# Patient Record
Sex: Male | Born: 1950 | Race: Black or African American | Hispanic: No | Marital: Married | Smoking: Former smoker
Health system: Southern US, Community
[De-identification: ages and names within clinical notes are randomized; demographics above are authoritative.]

## PROBLEM LIST (undated history)

## (undated) DIAGNOSIS — E1142 Type 2 diabetes mellitus with diabetic polyneuropathy: Secondary | ICD-10-CM

## (undated) DIAGNOSIS — E119 Type 2 diabetes mellitus without complications: Secondary | ICD-10-CM

## (undated) DIAGNOSIS — E7800 Pure hypercholesterolemia, unspecified: Secondary | ICD-10-CM

## (undated) DIAGNOSIS — N183 Chronic kidney disease, stage 3 unspecified: Secondary | ICD-10-CM

## (undated) DIAGNOSIS — G4733 Obstructive sleep apnea (adult) (pediatric): Secondary | ICD-10-CM

## (undated) DIAGNOSIS — I10 Essential (primary) hypertension: Secondary | ICD-10-CM

## (undated) DIAGNOSIS — D649 Anemia, unspecified: Secondary | ICD-10-CM

## (undated) DIAGNOSIS — A159 Respiratory tuberculosis unspecified: Secondary | ICD-10-CM

## (undated) DIAGNOSIS — Z9989 Dependence on other enabling machines and devices: Secondary | ICD-10-CM

## (undated) DIAGNOSIS — I739 Peripheral vascular disease, unspecified: Secondary | ICD-10-CM

## (undated) DIAGNOSIS — M1990 Unspecified osteoarthritis, unspecified site: Secondary | ICD-10-CM

## (undated) HISTORY — PX: TONSILLECTOMY: SUR1361

## (undated) HISTORY — PX: DILATION AND CURETTAGE OF UTERUS: SHX78

---
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
## 1997-12-25 ENCOUNTER — Ambulatory Visit: Admission: RE | Admit: 1997-12-25 | Discharge: 1997-12-25 | Payer: Self-pay | Admitting: Otolaryngology

## 1999-08-18 ENCOUNTER — Ambulatory Visit: Admission: RE | Admit: 1999-08-18 | Discharge: 1999-08-18 | Payer: Self-pay | Admitting: Otolaryngology

## 2003-06-26 DIAGNOSIS — E119 Type 2 diabetes mellitus without complications: Secondary | ICD-10-CM

## 2003-06-26 HISTORY — DX: Type 2 diabetes mellitus without complications: E11.9

## 2003-11-16 ENCOUNTER — Encounter: Admission: RE | Admit: 2003-11-16 | Discharge: 2003-11-16 | Payer: Self-pay | Admitting: Internal Medicine

## 2011-05-21 ENCOUNTER — Emergency Department (HOSPITAL_COMMUNITY)
Admission: EM | Admit: 2011-05-21 | Discharge: 2011-05-22 | Disposition: A | Discharge status: Home / Self Care | Payer: BC Managed Care – PPO | Attending: Emergency Medicine | Admitting: Emergency Medicine

## 2011-05-21 DIAGNOSIS — R110 Nausea: Secondary | ICD-10-CM | POA: Insufficient documentation

## 2011-05-21 DIAGNOSIS — R109 Unspecified abdominal pain: Secondary | ICD-10-CM | POA: Insufficient documentation

## 2011-05-21 DIAGNOSIS — E119 Type 2 diabetes mellitus without complications: Secondary | ICD-10-CM | POA: Insufficient documentation

## 2011-05-21 DIAGNOSIS — I10 Essential (primary) hypertension: Secondary | ICD-10-CM | POA: Insufficient documentation

## 2011-05-21 DIAGNOSIS — M549 Dorsalgia, unspecified: Secondary | ICD-10-CM | POA: Insufficient documentation

## 2011-05-21 DIAGNOSIS — Z794 Long term (current) use of insulin: Secondary | ICD-10-CM | POA: Insufficient documentation

## 2011-05-21 HISTORY — DX: Pure hypercholesterolemia, unspecified: E78.00

## 2011-05-21 HISTORY — DX: Essential (primary) hypertension: I10

## 2011-05-21 LAB — URINALYSIS, DIPSTICK ONLY
Bilirubin Urine: NEGATIVE
Glucose, UA: NEGATIVE mg/dL
Hgb urine dipstick: NEGATIVE
Ketones, ur: NEGATIVE mg/dL
Leukocytes, UA: NEGATIVE
Nitrite: NEGATIVE
Protein, ur: NEGATIVE mg/dL
Specific Gravity, Urine: 1.012 (ref 1.005–1.030)
Urobilinogen, UA: 1 mg/dL (ref 0.0–1.0)
pH: 6.5 (ref 5.0–8.0)

## 2011-05-21 NOTE — ED Notes (Signed)
Pt complains of left lower back pain, no injury noted, pain is intermittent and is worse in the am, primary Dr told him to come here for further evaluation. Pt also complains of pain radiating to his groin, pt hasn't seen any blood in his urine

## 2011-05-22 ENCOUNTER — Emergency Department (HOSPITAL_COMMUNITY): Payer: BC Managed Care – PPO

## 2011-05-22 LAB — BASIC METABOLIC PANEL
BUN: 10 mg/dL (ref 6–23)
CO2: 29 mEq/L (ref 19–32)
Calcium: 10.1 mg/dL (ref 8.4–10.5)
Chloride: 102 mEq/L (ref 96–112)
Creatinine, Ser: 0.96 mg/dL (ref 0.50–1.35)
GFR calc Af Amer: >90 mL/min (ref >90)
GFR calc non Af Amer: 88 mL/min — ABNORMAL LOW (ref >90)
Glucose, Bld: 86 mg/dL (ref 70–99)
Potassium: 3.8 mEq/L (ref 3.5–5.1)
Sodium: 138 mEq/L (ref 135–145)

## 2011-05-22 MED ORDER — HYDROMORPHONE HCL PF 1 MG/ML IJ SOLN: 1.0000 mg | Freq: Once | INTRAMUSCULAR | Status: DC

## 2011-05-22 MED ORDER — OXYCODONE-ACETAMINOPHEN 5-325 MG PO TABS: 1.0000 | ORAL_TABLET | ORAL | Status: AC | PRN

## 2011-05-22 MED ORDER — OXYCODONE-ACETAMINOPHEN 5-325 MG PO TABS
1.0000 | ORAL_TABLET | Freq: Once | ORAL | Status: AC
  Administered 2011-05-22: 1 via ORAL
  Filled 2011-05-22: qty 1

## 2011-05-22 MED ORDER — SODIUM CHLORIDE 0.9 % IV BOLUS (SEPSIS): 1000.0000 mL | Freq: Once | INTRAVENOUS | Status: DC

## 2011-05-22 NOTE — ED Provider Notes (Signed)
History     CSN: [id number] Arrival date & time: 05/21/2011  8:11 PM   First MD Initiated Contact with Patient 05/22/11 [phone]      Chief Complaint  Patient presents with  . Back Pain    (Consider location/radiation/quality/duration/timing/severity/associated sxs/prior treatment) The history is provided by the patient.   patient reports intermittent left flank pain for approximately one week.  Today he reports it worsened.  It became severe he developed nausea.  He told by his doctor to come to the ER for evaluation.  No prior history of ureteral stone.  He reports his pain is improving at this time.  He has a history of hypertension diabetes.  He's never been told he has an abdominal aneurysm.  His pain radiates around his left abdomen.  He has no urinary symptoms.  He has no fever or chills.  He has no testicular or penile pain.  Nothing worsens the symptoms.  Nothing improves the symptoms.  Currently her symptoms are mild.  That are worse his symptoms are severe in severity  Past Medical History  Diagnosis Date  . Hypertension   . Diabetes mellitus   . High cholesterol     History reviewed. No pertinent past surgical history.  History reviewed. No pertinent family history.  History  Substance Use Topics  . Smoking status: Not on file  . Smokeless tobacco: Not on file  . Alcohol Use: No      Review of Systems  Musculoskeletal: Positive for back pain.  All other systems reviewed and are negative.    Allergies  Review of patient's allergies indicates no known allergies.  Home Medications   Current Outpatient Rx  Name Route Sig Dispense Refill  . AMLODIPINE BESY-BENAZEPRIL HCL 5-40 MG PO CAPS Oral Take 1 capsule by mouth daily.      . ATORVASTATIN CALCIUM 20 MG PO TABS Oral Take 20 mg by mouth daily.      Marland Kitchen GLIMEPIRIDE 4 MG PO TABS Oral Take 4 mg by mouth daily before breakfast.      . INSULIN GLARGINE 100 UNIT/ML [place] SOLN Subcutaneous Inject 30 Units into the skin  at bedtime.     Marland Kitchen METFORMIN HCL 500 MG PO TABS Oral Take 500 mg by mouth 2 (two) times daily with a meal.      . NEBIVOLOL HCL 5 MG PO TABS Oral Take 5 mg by mouth daily.        BP 150/81  Pulse 59  Temp(Src) 98.4 F (36.9 C) (Oral)  Resp 18  SpO2 99%  Physical Exam  Nursing note and vitals reviewed. Constitutional: He is oriented to person, place, and time. He appears well-developed and well-nourished.  HENT:  Head: Normocephalic and atraumatic.  Eyes: EOM are normal.  Neck: Normal range of motion.  Cardiovascular: Normal rate, regular rhythm, normal heart sounds and intact distal pulses.   Pulmonary/Chest: Effort normal and breath sounds normal. No respiratory distress.  Abdominal: Soft. He exhibits no distension. There is no tenderness.  Genitourinary:       No CVA tenderness  Musculoskeletal: Normal range of motion.  Neurological: He is alert and oriented to person, place, and time.  Skin: Skin is warm and dry.  Psychiatric: He has a normal mood and affect. Judgment normal.    ED Course  Procedures (including critical care time)  Labs Reviewed  BASIC METABOLIC PANEL - Abnormal; Notable for the following:    GFR calc non Af Amer 88 (*)  All other components within normal limits  URINALYSIS, DIPSTICK ONLY   Ct Abdomen Pelvis Wo Contrast  05/22/2011  *RADIOLOGY REPORT*  Clinical Data: Left flank pain and history renal calculi.  CT ABDOMEN AND PELVIS WITHOUT CONTRAST  Technique:  Multidetector CT imaging of the abdomen and pelvis was performed following the standard protocol without intravenous contrast.  Comparison: 11/16/2003  Findings: No renal or ureteral calculi identified.  No hydronephrosis.  There is interval enlargement of these cysts of the lower pole of the left kidney.  This measures approximately 4 cm in greatest dimensions.  Internal density measurements are consistent with simple fluid by CT.  Other solid organs by unenhanced CT are unremarkable.  Bowel loops  are within normal limits.  No free fluid or abscess identified.  No hernias.  No abnormal calcifications.  Diverticulosis of the sigmoid colon present without evidence of diverticulitis. Atherosclerotic calcification of the abdominal aorta without aneurysmal disease.  No enlarged lymph nodes.  The spine is unremarkable.  Visualized lung bases show a well-circumscribed 7 mm subpleural nodule in the lateral right lower lobe which does not appear calcified.  IMPRESSION:  1.  No evidence of renal or ureteral calculi. 2.  Enlargement of a lower pole left renal cyst measure approximately 4 cm since prior CT in 2005.  This otherwise has a benign features by unenhanced CT. 3.  Incidental detection of 7 mm subpleural noncalcified nodule in the lateral right lower lobe.  Please see recommendations below for follow-up.  If the patient is at high risk for bronchogenic carcinoma, follow- up chest CT at 3-6 months is recommended.  If the patient is at low risk for bronchogenic carcinoma, follow-up chest CT at 6-12 months is recommended.  This recommendation follows the consensus statement: Guidelines for Management of Small Pulmonary Nodules Detected on CT Scans: A Statement from the [employer] as published in Radiology 2005; 237:395-400. Online at: https://www.arnold.com/.  Original Report Authenticated By: Azzie Roup, M.D.     1. Left flank pain       MDM  Symptoms concerning for left ureteral colic.  Labs and CT scan pending.  Pain treated  3:52 AM Patient feels much better at this time.  CT scan without pathology to explain cause of left flank pain.  Suspect recently passed left ureteral stone given the presentation.  Patient has been informed of his pulmonary nodule in his right lateral lower lung.  Is informed that he needs a repeat CT scan in 6 months.  The patient and spouse understand this and agree to followup CT scan as scheduled by [place], MD 05/22/11 [phone]

## 2012-12-01 ENCOUNTER — Emergency Department (HOSPITAL_COMMUNITY): Payer: BC Managed Care – PPO

## 2012-12-01 ENCOUNTER — Inpatient Hospital Stay (HOSPITAL_COMMUNITY)
Admission: EM | Admit: 2012-12-01 | Discharge: 2012-12-07 | DRG: 083 | Disposition: A | Discharge status: Home / Self Care | Payer: BC Managed Care – PPO | Attending: General Surgery | Admitting: General Surgery

## 2012-12-01 ENCOUNTER — Encounter (HOSPITAL_COMMUNITY): Payer: Self-pay | Admitting: Nurse Practitioner

## 2012-12-01 DIAGNOSIS — S2241XS Multiple fractures of ribs, right side, sequela: Secondary | ICD-10-CM

## 2012-12-01 DIAGNOSIS — S62639S Displaced fracture of distal phalanx of unspecified finger, sequela: Secondary | ICD-10-CM

## 2012-12-01 DIAGNOSIS — N289 Disorder of kidney and ureter, unspecified: Secondary | ICD-10-CM

## 2012-12-01 DIAGNOSIS — N189 Chronic kidney disease, unspecified: Secondary | ICD-10-CM | POA: Diagnosis present

## 2012-12-01 DIAGNOSIS — I517 Cardiomegaly: Secondary | ICD-10-CM | POA: Diagnosis present

## 2012-12-01 DIAGNOSIS — I498 Other specified cardiac arrhythmias: Secondary | ICD-10-CM | POA: Diagnosis not present

## 2012-12-01 DIAGNOSIS — N182 Chronic kidney disease, stage 2 (mild): Secondary | ICD-10-CM

## 2012-12-01 DIAGNOSIS — IMO0002 Reserved for concepts with insufficient information to code with codable children: Secondary | ICD-10-CM

## 2012-12-01 DIAGNOSIS — S2220XS Unspecified fracture of sternum, sequela: Secondary | ICD-10-CM

## 2012-12-01 DIAGNOSIS — R0902 Hypoxemia: Secondary | ICD-10-CM | POA: Diagnosis not present

## 2012-12-01 DIAGNOSIS — S2691XA Contusion of heart, unspecified with or without hemopericardium, initial encounter: Secondary | ICD-10-CM

## 2012-12-01 DIAGNOSIS — M19079 Primary osteoarthritis, unspecified ankle and foot: Secondary | ICD-10-CM | POA: Diagnosis present

## 2012-12-01 DIAGNOSIS — G4733 Obstructive sleep apnea (adult) (pediatric): Secondary | ICD-10-CM | POA: Diagnosis present

## 2012-12-01 DIAGNOSIS — E119 Type 2 diabetes mellitus without complications: Secondary | ICD-10-CM

## 2012-12-01 DIAGNOSIS — I491 Atrial premature depolarization: Secondary | ICD-10-CM | POA: Diagnosis not present

## 2012-12-01 DIAGNOSIS — E1149 Type 2 diabetes mellitus with other diabetic neurological complication: Secondary | ICD-10-CM | POA: Diagnosis present

## 2012-12-01 DIAGNOSIS — S2220XA Unspecified fracture of sternum, initial encounter for closed fracture: Secondary | ICD-10-CM

## 2012-12-01 DIAGNOSIS — I10 Essential (primary) hypertension: Secondary | ICD-10-CM

## 2012-12-01 DIAGNOSIS — I499 Cardiac arrhythmia, unspecified: Secondary | ICD-10-CM

## 2012-12-01 DIAGNOSIS — Z87891 Personal history of nicotine dependence: Secondary | ICD-10-CM

## 2012-12-01 DIAGNOSIS — I129 Hypertensive chronic kidney disease with stage 1 through stage 4 chronic kidney disease, or unspecified chronic kidney disease: Secondary | ICD-10-CM | POA: Diagnosis present

## 2012-12-01 DIAGNOSIS — Y92410 Unspecified street and highway as the place of occurrence of the external cause: Secondary | ICD-10-CM

## 2012-12-01 DIAGNOSIS — Z8611 Personal history of tuberculosis: Secondary | ICD-10-CM

## 2012-12-01 DIAGNOSIS — K5900 Constipation, unspecified: Secondary | ICD-10-CM | POA: Diagnosis not present

## 2012-12-01 DIAGNOSIS — S62639A Displaced fracture of distal phalanx of unspecified finger, initial encounter for closed fracture: Secondary | ICD-10-CM

## 2012-12-01 DIAGNOSIS — Z794 Long term (current) use of insulin: Secondary | ICD-10-CM

## 2012-12-01 DIAGNOSIS — S27892A Contusion of other specified intrathoracic organs, initial encounter: Secondary | ICD-10-CM

## 2012-12-01 DIAGNOSIS — Y998 Other external cause status: Secondary | ICD-10-CM

## 2012-12-01 DIAGNOSIS — E1142 Type 2 diabetes mellitus with diabetic polyneuropathy: Secondary | ICD-10-CM | POA: Diagnosis present

## 2012-12-01 DIAGNOSIS — Z9641 Presence of insulin pump (external) (internal): Secondary | ICD-10-CM

## 2012-12-01 DIAGNOSIS — S2241XA Multiple fractures of ribs, right side, initial encounter for closed fracture: Secondary | ICD-10-CM

## 2012-12-01 HISTORY — DX: Type 2 diabetes mellitus without complications: E11.9

## 2012-12-01 HISTORY — DX: Dependence on other enabling machines and devices: Z99.89

## 2012-12-01 HISTORY — DX: Respiratory tuberculosis unspecified: A15.9

## 2012-12-01 HISTORY — DX: Type 2 diabetes mellitus with diabetic polyneuropathy: E11.42

## 2012-12-01 HISTORY — DX: Obstructive sleep apnea (adult) (pediatric): G47.33

## 2012-12-01 HISTORY — DX: Unspecified osteoarthritis, unspecified site: M19.90

## 2012-12-01 LAB — COMPREHENSIVE METABOLIC PANEL
ALT: 19 U/L (ref 0–53)
AST: 33 U/L (ref 0–37)
Albumin: 4 g/dL (ref 3.5–5.2)
Alkaline Phosphatase: 84 U/L (ref 39–117)
BUN: 25 mg/dL — ABNORMAL HIGH (ref 6–23)
CO2: 23 mEq/L (ref 19–32)
Calcium: 10.3 mg/dL (ref 8.4–10.5)
Chloride: 103 mEq/L (ref 96–112)
Creatinine, Ser: 1.46 mg/dL — ABNORMAL HIGH (ref 0.50–1.35)
GFR calc Af Amer: 58 mL/min — ABNORMAL LOW (ref >90)
GFR calc non Af Amer: 50 mL/min — ABNORMAL LOW (ref >90)
Glucose, Bld: 102 mg/dL — ABNORMAL HIGH (ref 70–99)
Potassium: 3.8 mEq/L (ref 3.5–5.1)
Sodium: 138 mEq/L (ref 135–145)
Total Bilirubin: 0.4 mg/dL (ref 0.3–1.2)
Total Protein: 7.6 g/dL (ref 6.0–8.3)

## 2012-12-01 LAB — POCT I-STAT, CHEM 8
BUN: 28 mg/dL — ABNORMAL HIGH (ref 6–23)
Calcium, Ion: 1.23 mmol/L (ref 1.13–1.30)
Chloride: 105 mEq/L (ref 96–112)
Creatinine, Ser: 1.4 mg/dL — ABNORMAL HIGH (ref 0.50–1.35)
Glucose, Bld: 101 mg/dL — ABNORMAL HIGH (ref 70–99)
HCT: 45 % (ref 39.0–52.0)
Hemoglobin: 15.3 g/dL (ref 13.0–17.0)
Potassium: 3.8 mEq/L (ref 3.5–5.1)
Sodium: 140 mEq/L (ref 135–145)
TCO2: 32 mmol/L (ref 0–100)

## 2012-12-01 LAB — URINE MICROSCOPIC-ADD ON

## 2012-12-01 LAB — URINALYSIS, ROUTINE W REFLEX MICROSCOPIC
Bilirubin Urine: NEGATIVE
Glucose, UA: 250 mg/dL — AB
Ketones, ur: NEGATIVE mg/dL
Leukocytes, UA: NEGATIVE
Nitrite: NEGATIVE
Protein, ur: NEGATIVE mg/dL
Specific Gravity, Urine: 1.035 — ABNORMAL HIGH (ref 1.005–1.030)
Urobilinogen, UA: 0.2 mg/dL (ref 0.0–1.0)
pH: 6 (ref 5.0–8.0)

## 2012-12-01 LAB — CG4 I-STAT (LACTIC ACID): Lactic Acid, Venous: 1.46 mmol/L (ref 0.5–2.2)

## 2012-12-01 LAB — CBC
HCT: 42.8 % (ref 39.0–52.0)
Hemoglobin: 14.7 g/dL (ref 13.0–17.0)
MCH: 26.3 pg (ref 26.0–34.0)
MCHC: 34.3 g/dL (ref 30.0–36.0)
MCV: 76.7 fL — ABNORMAL LOW (ref 78.0–100.0)
Platelets: 168 10*3/uL (ref 150–400)
RBC: 5.58 MIL/uL (ref 4.22–5.81)
RDW: 14.8 % (ref 11.5–15.5)
WBC: 7.5 10*3/uL (ref 4.0–10.5)

## 2012-12-01 LAB — SAMPLE TO BLOOD BANK

## 2012-12-01 LAB — PROTIME-INR
INR: 0.93 (ref 0.00–1.49)
Prothrombin Time: 12.4 seconds (ref 11.6–15.2)

## 2012-12-01 LAB — GLUCOSE, CAPILLARY: Glucose-Capillary: 84 mg/dL (ref 70–99)

## 2012-12-01 MED ORDER — SODIUM CHLORIDE 0.9 % IV BOLUS (SEPSIS)
1000.0000 mL | Freq: Once | INTRAVENOUS | Status: AC
  Administered 2012-12-01: 1000 mL via INTRAVENOUS

## 2012-12-01 MED ORDER — HYDROMORPHONE HCL PF 1 MG/ML IJ SOLN
1.0000 mg | Freq: Once | INTRAMUSCULAR | Status: AC
  Administered 2012-12-01: 1 mg via INTRAVENOUS
  Filled 2012-12-01: qty 1

## 2012-12-01 MED ORDER — DEXTROSE 250 MG/ML IV SOLN
12.5000 g | Freq: Once | INTRAVENOUS | Status: DC
  Filled 2012-12-01: qty 50

## 2012-12-01 MED ORDER — DEXTROSE 50 % IV SOLN
INTRAVENOUS | Status: AC
  Administered 2012-12-01: 25 mL
  Filled 2012-12-01: qty 50

## 2012-12-01 MED ORDER — IOHEXOL 300 MG/ML  SOLN
100.0000 mL | Freq: Once | INTRAMUSCULAR | Status: AC | PRN
  Administered 2012-12-01: 100 mL via INTRAVENOUS

## 2012-12-01 NOTE — ED Notes (Signed)
Dr. Grandville Silos paged to Dr. Redmond Pulling @ [id number] Charlton Amor D

## 2012-12-01 NOTE — Progress Notes (Signed)
Orthopedic Tech Progress Note Patient Details:  [name] [birth year] [id number]  Ortho Devices Type of Ortho Device: Finger splint Ortho Device/Splint Location: RUE Ortho Device/Splint Interventions: Ordered;Application   Braulio Bosch 12/01/2012, 10:40 PM

## 2012-12-01 NOTE — ED Notes (Addendum)
[employer] speaking with pt & family.  2 liters NS IVF infusing, VSS, no changes, to CT.

## 2012-12-01 NOTE — ED Notes (Signed)
Dr. [name].[place] at [facility].

## 2012-12-01 NOTE — ED Notes (Addendum)
CBG 53 

## 2012-12-01 NOTE — ED Notes (Addendum)
Dr. Grandville Silos (hand MD) at [facility]. orthotech paged for finger splint.

## 2012-12-01 NOTE — ED Notes (Signed)
VSS, no changes, eyeglasses cleaned and returned to pt's face, speaking with family at [facility], using urinal.

## 2012-12-01 NOTE — ED Notes (Signed)
Attempted report 
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
## 2012-12-01 NOTE — ED Notes (Signed)
Back from CT/ xray, no changes, "feel better", famil;y at [facility], alert, NAD, calm, interactive, resps e/u, speaking in clear complete sentences, VSS.

## 2012-12-01 NOTE — ED Notes (Signed)
Pt moved to room 36, family at [facility], no changes, alert, NAD, calm, skin W&D, VSS, pending CT and non-portable xrays, labs resulting.
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
## 2012-12-01 NOTE — ED Notes (Signed)
CBG 100 

## 2012-12-01 NOTE — ED Notes (Signed)
Xray at [facility], remains alert, NAD, calm, interactive, resps e/u, speaking in clear complete sentences, lying flat, skin W&D, VSS.
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
## 2012-12-02 ENCOUNTER — Encounter (HOSPITAL_COMMUNITY): Payer: Self-pay | Admitting: General Practice

## 2012-12-02 DIAGNOSIS — I517 Cardiomegaly: Secondary | ICD-10-CM

## 2012-12-02 DIAGNOSIS — I499 Cardiac arrhythmia, unspecified: Secondary | ICD-10-CM

## 2012-12-02 DIAGNOSIS — S62639A Displaced fracture of distal phalanx of unspecified finger, initial encounter for closed fracture: Secondary | ICD-10-CM

## 2012-12-02 DIAGNOSIS — I10 Essential (primary) hypertension: Secondary | ICD-10-CM | POA: Insufficient documentation

## 2012-12-02 DIAGNOSIS — IMO0001 Reserved for inherently not codable concepts without codable children: Secondary | ICD-10-CM | POA: Insufficient documentation

## 2012-12-02 DIAGNOSIS — S2220XA Unspecified fracture of sternum, initial encounter for closed fracture: Secondary | ICD-10-CM

## 2012-12-02 DIAGNOSIS — S2691XA Contusion of heart, unspecified with or without hemopericardium, initial encounter: Secondary | ICD-10-CM

## 2012-12-02 DIAGNOSIS — N189 Chronic kidney disease, unspecified: Secondary | ICD-10-CM | POA: Insufficient documentation

## 2012-12-02 LAB — GLUCOSE, CAPILLARY
Glucose-Capillary: 53 mg/dL — ABNORMAL LOW (ref 70–99)
Glucose-Capillary: 100 mg/dL — ABNORMAL HIGH (ref 70–99)
Glucose-Capillary: 168 mg/dL — ABNORMAL HIGH (ref 70–99)
Glucose-Capillary: 210 mg/dL — ABNORMAL HIGH (ref 70–99)
Glucose-Capillary: 174 mg/dL — ABNORMAL HIGH (ref 70–99)
Glucose-Capillary: 190 mg/dL — ABNORMAL HIGH (ref 70–99)

## 2012-12-02 LAB — CBC
HCT: 39.3 % (ref 39.0–52.0)
Hemoglobin: 13.6 g/dL (ref 13.0–17.0)
MCH: 26.6 pg (ref 26.0–34.0)
MCHC: 34.6 g/dL (ref 30.0–36.0)
MCV: 76.9 fL — ABNORMAL LOW (ref 78.0–100.0)
Platelets: 144 10*3/uL — ABNORMAL LOW (ref 150–400)
RBC: 5.11 MIL/uL (ref 4.22–5.81)
RDW: 14.8 % (ref 11.5–15.5)
WBC: 7.2 10*3/uL (ref 4.0–10.5)

## 2012-12-02 LAB — BASIC METABOLIC PANEL
BUN: 20 mg/dL (ref 6–23)
CO2: 23 mEq/L (ref 19–32)
Calcium: 9.1 mg/dL (ref 8.4–10.5)
Chloride: 102 mEq/L (ref 96–112)
Creatinine, Ser: 1.36 mg/dL — ABNORMAL HIGH (ref 0.50–1.35)
GFR calc Af Amer: 63 mL/min — ABNORMAL LOW (ref >90)
GFR calc non Af Amer: 55 mL/min — ABNORMAL LOW (ref >90)
Glucose, Bld: 162 mg/dL — ABNORMAL HIGH (ref 70–99)
Potassium: 3.9 mEq/L (ref 3.5–5.1)
Sodium: 136 mEq/L (ref 135–145)

## 2012-12-02 LAB — HEMOGLOBIN A1C
Hgb A1c MFr Bld: 9.6 % — ABNORMAL HIGH (ref <5.7)
Mean Plasma Glucose: 229 mg/dL — ABNORMAL HIGH (ref <117)

## 2012-12-02 LAB — TROPONIN I: Troponin I: <0.3 ng/mL (ref <0.30)

## 2012-12-02 LAB — CK: Total CK: 2746 U/L — ABNORMAL HIGH (ref 7–232)

## 2012-12-02 MED ORDER — INSULIN PUMP
Freq: Three times a day (TID) | SUBCUTANEOUS | Status: DC
  Administered 2012-12-02 – 2012-12-04 (×11): via SUBCUTANEOUS
  Administered 2012-12-04: 4 via SUBCUTANEOUS
  Administered 2012-12-04: 17:00:00 via SUBCUTANEOUS
  Administered 2012-12-05 (×2): 4 via SUBCUTANEOUS
  Administered 2012-12-05: 6 via SUBCUTANEOUS
  Administered 2012-12-05: 4 via SUBCUTANEOUS
  Administered 2012-12-06: 22:00:00 via SUBCUTANEOUS
  Administered 2012-12-06: 2 via SUBCUTANEOUS
  Administered 2012-12-06: 08:00:00 via SUBCUTANEOUS
  Administered 2012-12-07: 4 via SUBCUTANEOUS
  Filled 2012-12-02: qty 1

## 2012-12-02 MED ORDER — HYDROCODONE-ACETAMINOPHEN 5-325 MG PO TABS: 1.0000 | ORAL_TABLET | ORAL | Status: DC | PRN

## 2012-12-02 MED ORDER — PANTOPRAZOLE SODIUM 40 MG PO TBEC: 40.0000 mg | DELAYED_RELEASE_TABLET | Freq: Every day | ORAL | Status: DC

## 2012-12-02 MED ORDER — ENOXAPARIN SODIUM 40 MG/0.4ML ~~LOC~~ SOLN
40.0000 mg | SUBCUTANEOUS | Status: DC
  Filled 2012-12-02: qty 0.4

## 2012-12-02 MED ORDER — LISINOPRIL 10 MG PO TABS
10.0000 mg | ORAL_TABLET | Freq: Every day | ORAL | Status: DC
  Administered 2012-12-02 – 2012-12-07 (×5): 10 mg via ORAL
  Filled 2012-12-02 (×6): qty 1

## 2012-12-02 MED ORDER — CARVEDILOL 12.5 MG PO TABS
12.5000 mg | ORAL_TABLET | Freq: Two times a day (BID) | ORAL | Status: DC
  Administered 2012-12-02: 12.5 mg via ORAL
  Filled 2012-12-02 (×3): qty 1

## 2012-12-02 MED ORDER — ENOXAPARIN SODIUM 30 MG/0.3ML ~~LOC~~ SOLN
30.0000 mg | Freq: Two times a day (BID) | SUBCUTANEOUS | Status: DC
  Administered 2012-12-02 – 2012-12-07 (×11): 30 mg via SUBCUTANEOUS
  Filled 2012-12-02 (×16): qty 0.3

## 2012-12-02 MED ORDER — HYDROMORPHONE HCL PF 1 MG/ML IJ SOLN: 0.5000 mg | INTRAMUSCULAR | Status: DC | PRN

## 2012-12-02 MED ORDER — DOCUSATE SODIUM 100 MG PO CAPS
100.0000 mg | ORAL_CAPSULE | Freq: Two times a day (BID) | ORAL | Status: DC
  Administered 2012-12-02 – 2012-12-03 (×4): 100 mg via ORAL
  Filled 2012-12-02 (×4): qty 1

## 2012-12-02 MED ORDER — POTASSIUM CHLORIDE IN NACL 20-0.9 MEQ/L-% IV SOLN
INTRAVENOUS | Status: DC
  Administered 2012-12-02: 01:00:00 via INTRAVENOUS
  Filled 2012-12-02 (×2): qty 1000

## 2012-12-02 MED ORDER — HYDROCODONE-ACETAMINOPHEN 10-325 MG PO TABS
0.5000 | ORAL_TABLET | ORAL | Status: DC | PRN
  Administered 2012-12-02 – 2012-12-03 (×6): 2 via ORAL
  Filled 2012-12-02 (×6): qty 2

## 2012-12-02 MED ORDER — ONDANSETRON HCL 4 MG/2ML IJ SOLN: 4.0000 mg | Freq: Four times a day (QID) | INTRAMUSCULAR | Status: DC | PRN

## 2012-12-02 MED ORDER — INSULIN ASPART 100 UNIT/ML ~~LOC~~ SOLN: 0.0000 [IU] | Freq: Three times a day (TID) | SUBCUTANEOUS | Status: DC

## 2012-12-02 MED ORDER — TRIAMTERENE-HCTZ 37.5-25 MG PO TABS
1.0000 | ORAL_TABLET | Freq: Every day | ORAL | Status: DC
  Administered 2012-12-02 – 2012-12-07 (×6): 1 via ORAL
  Filled 2012-12-02 (×6): qty 1

## 2012-12-02 MED ORDER — PANTOPRAZOLE SODIUM 40 MG IV SOLR
40.0000 mg | Freq: Every day | INTRAVENOUS | Status: DC
  Filled 2012-12-02: qty 40

## 2012-12-02 MED ORDER — ONDANSETRON HCL 4 MG PO TABS: 4.0000 mg | ORAL_TABLET | Freq: Four times a day (QID) | ORAL | Status: DC | PRN

## 2012-12-02 MED ORDER — HYDROCODONE-ACETAMINOPHEN 5-325 MG PO TABS
2.0000 | ORAL_TABLET | ORAL | Status: DC | PRN
  Administered 2012-12-02: 2 via ORAL
  Filled 2012-12-02: qty 2

## 2012-12-02 MED ORDER — POLYETHYLENE GLYCOL 3350 17 G PO PACK
17.0000 g | PACK | Freq: Every day | ORAL | Status: DC
  Administered 2012-12-02 – 2012-12-07 (×6): 17 g via ORAL
  Filled 2012-12-02 (×6): qty 1

## 2012-12-02 MED ORDER — CYCLOBENZAPRINE HCL 10 MG PO TABS
10.0000 mg | ORAL_TABLET | Freq: Three times a day (TID) | ORAL | Status: DC
  Administered 2012-12-02 – 2012-12-04 (×10): 10 mg via ORAL
  Filled 2012-12-02 (×14): qty 1

## 2012-12-02 MED ORDER — MORPHINE SULFATE 2 MG/ML IJ SOLN
1.0000 mg | INTRAMUSCULAR | Status: DC | PRN
  Administered 2012-12-02: 2 mg via INTRAVENOUS
  Filled 2012-12-02: qty 1

## 2012-12-02 MED ORDER — HYDROCODONE-ACETAMINOPHEN 5-325 MG PO TABS: 0.5000 | ORAL_TABLET | ORAL | Status: DC | PRN

## 2012-12-02 NOTE — Progress Notes (Signed)
Patient is doing fine, but pain is an issue.  May have some blunt cardiac injury because of the arrhythmias this AM.  Will have cardiology assess for any treatment.  Kathryne Eriksson. Dahlia Bailiff, MD, [place] [phone] Trauma Surgeon
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
## 2012-12-02 NOTE — Progress Notes (Signed)
Inpatient Diabetes Program Recommendations  AACE/ADA: New Consensus Statement on Inpatient Glycemic Control (2013)  Target Ranges:  Prepandial:   less than 140 mg/dL      Peak postprandial:   less than 180 mg/dL (1-2 hours)      Critically ill patients:  140 - 180 mg/dL   Reason for Visit: Patient has V-GO 20 (disposable insulin delivery device that acts like a continuous insulin pump).  The V-Go 20 provides .83 units per hour of insulin and then with each push of the bolus delivery button, the patient receives 2 units of insulin.  He states that he usually gives 4 units of insulin with each meal.  He just changed V-Go out around lunch time.  Discussed insulin pump policy with patient and will have him sign consent.  Will discuss with RN.

## 2012-12-02 NOTE — Progress Notes (Signed)
UR completed 
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
## 2012-12-02 NOTE — ED Provider Notes (Signed)
I saw and evaluated the patient, reviewed the resident's note and I agree with the findings and plan.  Please see my separate note regarding my evaluation of the patient.  I personally saw and interpreted the ECG and agree with the interpretation of Dr. Sharion Settler, MD 12/02/12 1345

## 2012-12-02 NOTE — Progress Notes (Signed)
  Echocardiogram 2D Echocardiogram has been performed.  [name] 12/02/2012, 3:53 PM

## 2012-12-03 ENCOUNTER — Inpatient Hospital Stay (HOSPITAL_COMMUNITY): Payer: BC Managed Care – PPO

## 2012-12-03 DIAGNOSIS — S2241XA Multiple fractures of ribs, right side, initial encounter for closed fracture: Secondary | ICD-10-CM

## 2012-12-03 LAB — GLUCOSE, CAPILLARY
Glucose-Capillary: 208 mg/dL — ABNORMAL HIGH (ref 70–99)
Glucose-Capillary: 229 mg/dL — ABNORMAL HIGH (ref 70–99)
Glucose-Capillary: 255 mg/dL — ABNORMAL HIGH (ref 70–99)
Glucose-Capillary: 170 mg/dL — ABNORMAL HIGH (ref 70–99)
Glucose-Capillary: 131 mg/dL — ABNORMAL HIGH (ref 70–99)

## 2012-12-03 MED ORDER — OXYCODONE HCL 5 MG PO TABS
10.0000 mg | ORAL_TABLET | ORAL | Status: DC | PRN
  Administered 2012-12-03 – 2012-12-04 (×2): 20 mg via ORAL
  Administered 2012-12-04: 15 mg via ORAL
  Administered 2012-12-05 – 2012-12-06 (×6): 20 mg via ORAL
  Administered 2012-12-07 (×2): 15 mg via ORAL
  Filled 2012-12-03: qty 4
  Filled 2012-12-03: qty 3
  Filled 2012-12-03: qty 4
  Filled 2012-12-03: qty 3
  Filled 2012-12-03 (×3): qty 4
  Filled 2012-12-03: qty 3
  Filled 2012-12-03 (×3): qty 4

## 2012-12-03 MED ORDER — TRAMADOL HCL 50 MG PO TABS
100.0000 mg | ORAL_TABLET | Freq: Four times a day (QID) | ORAL | Status: DC
  Administered 2012-12-03 – 2012-12-07 (×15): 100 mg via ORAL
  Filled 2012-12-03 (×22): qty 2
  Filled 2012-12-03 (×2): qty 1
  Filled 2012-12-03 (×3): qty 2
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
## 2012-12-03 NOTE — Progress Notes (Signed)
Physical Therapy Treatment Patient Details Name: [name] MRN: [id number] DOB: 1950/08/07 Today's Date: 12/03/2012 Time: 1253-1310 PT Time Calculation (min): 17 min  PT Assessment / Plan / Recommendation Comments on Treatment Session  Pt doing well with sit to stand transfers with bracing with pillow. Improved tolerance to ambulation, walked 450' without assistive device. Ascended/descended one flight stairs with supervision. UNABLE TO PERFORM SUPINE TO/FROM SIT due to severe 9/10 sternal pain, despite being premedicated. Pt will need to be able to get in/out of bed in order to go home.     Follow Up Recommendations  No PT follow up     Does the patient have the potential to tolerate intense rehabilitation     Barriers to Discharge        Equipment Recommendations  None recommended by PT    Recommendations for Other Services    Frequency Min 3X/week   Plan Discharge plan remains appropriate;Frequency remains appropriate    Precautions / Restrictions Precautions Precautions: Sternal Precaution Comments: sternal fx Restrictions Weight Bearing Restrictions: No   Pertinent Vitals/Pain **9/10 sternal pain with attempted sit to supine 1-3/10 with walking Pt premedicated*    Mobility  Bed Mobility Details for Bed Mobility Assistance: attempted sit to supine, but pt reported severe 9/10 sternal pain with initiation of movement while bracing with pillow Transfers Transfers: Sit to Stand;Stand to Sit Sit to Stand: From bed;5: Supervision Stand to Sit: To bed;5: Supervision Details for Transfer Assistance: Sternal bracing with pillow for sit to/from stand. Verbal cues for set up (knees and trunk flexed, "nose over toes") Ambulation/Gait Ambulation/Gait Assistance: 6: Modified independent (Device/Increase time) Ambulation Distance (Feet): 450 Feet Assistive device: None Ambulation/Gait Assistance Details: Pt had sharp, stabbing pain in chest at sternum and just to left of  sternum while walking, this limited overall gait distance. Multiple standing rest breaks due to pain. Gait Pattern: Wide base of support;Within Functional Limits Stairs: Yes Stairs Assistance: 5: Supervision Stair Management Technique: Forwards;One rail Left;Step to pattern Number of Stairs: 12    Exercises     PT Diagnosis:    PT Problem List:   PT Treatment Interventions:     PT Goals Acute Rehab PT Goals PT Goal Formulation: With patient Time For Goal Achievement: 12/05/12 Potential to Achieve Goals: Good Pt will go Supine/Side to Sit: with min assist PT Goal: Supine/Side to Sit - Progress: Not met Pt will go Sit to Supine/Side: with min assist PT Goal: Sit to Supine/Side - Progress: Not met Pt will go Sit to Stand: with supervision PT Goal: Sit to Stand - Progress: Met Pt will go Stand to Sit: with supervision PT Goal: Stand to Sit - Progress: Met Pt will Ambulate: >150 feet;with modified independence PT Goal: Ambulate - Progress: Met Pt will Go Up / Down Stairs: 3-5 stairs;with supervision;with rail(s) PT Goal: Up/Down Stairs - Progress: Met  Visit Information  Last PT Received On: 12/03/12 Assistance Needed: +1    Subjective Data  Subjective: Pt had new pain medicine (Tramodol) at 12:25. After walking and stair training pt reported severe pain with attempted bed mobility.  Patient Stated Goal: Return home   Cognition  Cognition Arousal/Alertness: Awake/alert Behavior During Therapy: WFL for tasks assessed/performed Overall Cognitive Status: Within Functional Limits for tasks assessed    Balance     End of Session PT - End of Session Activity Tolerance: Patient limited by pain Patient left: with call bell/phone within reach;with family/visitor present;in bed (sitting on edge of bed) Nurse Communication: Mobility  status   GP     Blondell Reveal Kistler 12/03/2012, 1:21 PM [phone]

## 2012-12-03 NOTE — Progress Notes (Signed)
Pt was sitting up in bed when I arrived. [name] and another visitor were bedside. Had prayer w/pt and family. Pt and family grateful for visit and prayer. Ernest Haber Chaplain  12/03/12 1100  Clinical Encounter Type  Visited With Patient and family together

## 2012-12-03 NOTE — Progress Notes (Signed)
Physical Therapy Treatment Patient Details Name: Joshia Roser MRN: [id number] DOB: 11-30-1950 Today's Date: 12/03/2012 Time: 1036-1100 PT Time Calculation (min): 24 min  PT Assessment / Plan / Recommendation Comments on Treatment Session  Instructed pt in bracing sternum with pillow for transfers, which decreased sternal pain. However with walking pt had severe stabbing L chest pain of 9/10 which limited actvity tolerance. Pt has one flight of stairs to enter home, will plan to see pt again later today for trial of stairs. Family doesn't feel pt is ready to DC home.     Follow Up Recommendations  No PT follow up     Does the patient have the potential to tolerate intense rehabilitation     Barriers to Discharge        Equipment Recommendations  None recommended by PT    Recommendations for Other Services    Frequency Min 3X/week   Plan Discharge plan remains appropriate;Frequency remains appropriate    Precautions / Restrictions Restrictions Weight Bearing Restrictions: No   Pertinent Vitals/Pain *9/10 sternal pain with walking 2-3/10 at rest RN aware; pt had pain meds just prior to PT, likely that meds hadn't yet taken effect**    Mobility  Transfers Transfers: Sit to Stand;Stand to Sit Sit to Stand: With upper extremity assist;From bed;5: Supervision Stand to Sit: With upper extremity assist;To bed;5: Supervision Details for Transfer Assistance: Instructed pt to brace sternum with pillow for sit to/from stand transfers. Pt reported this was helpful.  Ambulation/Gait Ambulation/Gait Assistance: 5: Supervision Ambulation Distance (Feet): 150 Feet Assistive device: None Ambulation/Gait Assistance Details: Pt had sharp, stabbing pain in chest at sternum and just to left of sternum while walking, this limited overall gait distance. Multiple standing rest breaks due to pain. Gait Pattern: Wide base of support;Within Functional Limits    Exercises     PT Diagnosis:    PT  Problem List:   PT Treatment Interventions:     PT Goals Acute Rehab PT Goals PT Goal Formulation: With patient Time For Goal Achievement: 12/05/12 Potential to Achieve Goals: Good Pt will go Supine/Side to Sit: with min assist Pt will go Sit to Supine/Side: with min assist Pt will go Sit to Stand: with supervision PT Goal: Sit to Stand - Progress: Met Pt will go Stand to Sit: with supervision PT Goal: Stand to Sit - Progress: Met Pt will Ambulate: >150 feet;with modified independence PT Goal: Ambulate - Progress: Progressing toward goal Pt will Go Up / Down Stairs: 3-5 stairs;with supervision;with rail(s)  Visit Information  Last PT Received On: 12/03/12 Assistance Needed: +1    Subjective Data  Subjective: Pt states pain is low at rest but is severe with sit to stand and with walking.  Patient Stated Goal: Return home   Cognition       Balance     End of Session PT - End of Session Activity Tolerance: Patient limited by pain Patient left: in chair;with call bell/phone within reach;with family/visitor present Nurse Communication: Mobility status   GP     Blondell Reveal Kistler 12/03/2012, 11:12 AM [phone]
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
## 2012-12-03 NOTE — Progress Notes (Signed)
Patient having a lot more pain today.  He and his wife state that he cannot get out of bed on his own.  Limited mobility.  Has not worked with PT today yet.  Will need to increase pain medications, or perhaps change to something else.  Limit on use of NSAIDs because of chronic renal insufficiency.  Have added tramadol, and will consider changing to oxycodone.  May not be able to go home today.  This patient has been seen and I agree with the findings and treatment plan.  Kathryne Eriksson. Dahlia Bailiff, MD, [place] [phone] (pager) [phone] (direct pager) Trauma Surgeon
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
## 2012-12-04 LAB — GLUCOSE, CAPILLARY
Glucose-Capillary: 121 mg/dL — ABNORMAL HIGH (ref 70–99)
Glucose-Capillary: 131 mg/dL — ABNORMAL HIGH (ref 70–99)
Glucose-Capillary: 172 mg/dL — ABNORMAL HIGH (ref 70–99)
Glucose-Capillary: 171 mg/dL — ABNORMAL HIGH (ref 70–99)
Glucose-Capillary: 183 mg/dL — ABNORMAL HIGH (ref 70–99)
Glucose-Capillary: 212 mg/dL — ABNORMAL HIGH (ref 70–99)

## 2012-12-04 LAB — CDS SEROLOGY

## 2012-12-04 MED ORDER — DOCUSATE SODIUM 100 MG PO CAPS
200.0000 mg | ORAL_CAPSULE | Freq: Two times a day (BID) | ORAL | Status: DC
  Administered 2012-12-04 – 2012-12-07 (×6): 200 mg via ORAL
  Filled 2012-12-04 (×3): qty 1
  Filled 2012-12-04 (×2): qty 2
  Filled 2012-12-04 (×4): qty 1

## 2012-12-04 NOTE — Progress Notes (Signed)
SATURATION QUALIFICATIONS: (This note is used to comply with regulatory documentation for home oxygen)  Patient Saturations on Room Air at Rest = 91%  Patient Saturations on Room Air while Ambulating = 86-88%  Patient Saturations on 2 Liters of oxygen while Ambulating = 94-96%  Please briefly explain why patient needs home oxygen:
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
## 2012-12-04 NOTE — Progress Notes (Signed)
SATURATION QUALIFICATIONS: (This note is used to comply with regulatory documentation for home oxygen)  Patient Saturations on Room Air at Rest = 88%  Patient Saturations on Room Air while Ambulating = 0%  Patient Saturations on 2 Liters of oxygen while Ambulating = 94%  Please briefly explain why patient needs home oxygen: Patient's saturation dropped to 88% room air, felt dizzy and was profusely sweating, CBG normal.
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
## 2012-12-04 NOTE — Progress Notes (Signed)
Patient progressing well with therapies - does have to show ability to get up without assistance to PT but reportedly he did this yesterday.  Also having some hypoxia with exertion.  Hope to D/C by tomorrow AM.  I spoke at length with his wife and daughter as well, including going over radiology findings. Patient examined and I agree with the assessment and plan  Georganna Skeans, MD, MPH, FACS Pager: [phone]  12/04/2012 1:28 PM
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
## 2012-12-05 ENCOUNTER — Inpatient Hospital Stay (HOSPITAL_COMMUNITY): Payer: BC Managed Care – PPO

## 2012-12-05 LAB — CBC
HCT: 38.4 % — ABNORMAL LOW (ref 39.0–52.0)
Hemoglobin: 13.2 g/dL (ref 13.0–17.0)
MCH: 26.5 pg (ref 26.0–34.0)
MCHC: 34.4 g/dL (ref 30.0–36.0)
MCV: 77.1 fL — ABNORMAL LOW (ref 78.0–100.0)
Platelets: 166 10*3/uL (ref 150–400)
RBC: 4.98 MIL/uL (ref 4.22–5.81)
RDW: 14.9 % (ref 11.5–15.5)
WBC: 7.5 10*3/uL (ref 4.0–10.5)

## 2012-12-05 LAB — GLUCOSE, CAPILLARY
Glucose-Capillary: 118 mg/dL — ABNORMAL HIGH (ref 70–99)
Glucose-Capillary: 143 mg/dL — ABNORMAL HIGH (ref 70–99)
Glucose-Capillary: 195 mg/dL — ABNORMAL HIGH (ref 70–99)
Glucose-Capillary: 152 mg/dL — ABNORMAL HIGH (ref 70–99)
Glucose-Capillary: 189 mg/dL — ABNORMAL HIGH (ref 70–99)

## 2012-12-05 MED ORDER — MAGNESIUM CITRATE PO SOLN
1.0000 | Freq: Once | ORAL | Status: AC
  Administered 2012-12-05: 1 via ORAL
  Filled 2012-12-05: qty 296

## 2012-12-05 MED ORDER — CYCLOBENZAPRINE HCL 10 MG PO TABS
10.0000 mg | ORAL_TABLET | Freq: Three times a day (TID) | ORAL | Status: DC | PRN
  Administered 2012-12-05: 10 mg via ORAL

## 2012-12-05 NOTE — Progress Notes (Addendum)
Orthopedic Tech Progress Note Patient Details:  [name] 08-29-1950 [id number] Finger splint applied to Right index finger in full extension as ordered. Finger covered in gauze before splint applied.  Ortho Devices Type of Ortho Device: Finger splint Ortho Device/Splint Location: Right index Ortho Device/Splint Interventions: Application   Asia R Thompson 12/05/2012, 1:22 PM
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
## 2012-12-05 NOTE — Progress Notes (Signed)
Patient examined and I agree with the assessment and plan I also spoke to his daughter and to his wife by speaker phone.  Will try mag citrate and see how he is doing this PM. Georganna Skeans, MD, MPH, FACS Pager: [phone]  12/05/2012 11:27 AM

## 2012-12-06 ENCOUNTER — Inpatient Hospital Stay (HOSPITAL_COMMUNITY): Payer: BC Managed Care – PPO

## 2012-12-06 DIAGNOSIS — K5900 Constipation, unspecified: Secondary | ICD-10-CM

## 2012-12-06 LAB — GLUCOSE, CAPILLARY
Glucose-Capillary: 176 mg/dL — ABNORMAL HIGH (ref 70–99)
Glucose-Capillary: 181 mg/dL — ABNORMAL HIGH (ref 70–99)
Glucose-Capillary: 119 mg/dL — ABNORMAL HIGH (ref 70–99)
Glucose-Capillary: 103 mg/dL — ABNORMAL HIGH (ref 70–99)
Glucose-Capillary: 145 mg/dL — ABNORMAL HIGH (ref 70–99)

## 2012-12-06 MED ORDER — BISACODYL 10 MG RE SUPP
10.0000 mg | Freq: Every day | RECTAL | Status: DC | PRN
  Administered 2012-12-06: 10 mg via RECTAL
  Filled 2012-12-06: qty 1

## 2012-12-06 MED ORDER — DOCUSATE SODIUM 283 MG RE ENEM
1.0000 | ENEMA | Freq: Every day | RECTAL | Status: DC
  Administered 2012-12-06: 283 mg via RECTAL
  Filled 2012-12-06 (×3): qty 1
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
## 2012-12-07 LAB — GLUCOSE, CAPILLARY
Glucose-Capillary: 177 mg/dL — ABNORMAL HIGH (ref 70–99)
Glucose-Capillary: 170 mg/dL — ABNORMAL HIGH (ref 70–99)

## 2012-12-07 MED ORDER — TRAMADOL HCL 50 MG PO TABS: 100.0000 mg | ORAL_TABLET | Freq: Four times a day (QID) | ORAL | Status: DC

## 2012-12-07 MED ORDER — OXYCODONE HCL 10 MG PO TABS: 10.0000 mg | ORAL_TABLET | ORAL | Status: DC | PRN
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
## 2012-12-22 NOTE — Discharge Summary (Signed)
Stable for discharge

## 2013-01-01 ENCOUNTER — Ambulatory Visit
Admission: RE | Admit: 2013-01-01 | Discharge: 2013-01-01 | Disposition: A | Discharge status: Home / Self Care | Payer: BC Managed Care – PPO | Source: Ambulatory Visit | Attending: Chiropractic Medicine | Admitting: Chiropractic Medicine

## 2013-01-01 ENCOUNTER — Other Ambulatory Visit: Payer: Self-pay | Admitting: Chiropractic Medicine

## 2013-01-01 DIAGNOSIS — M5412 Radiculopathy, cervical region: Secondary | ICD-10-CM

## 2013-01-01 DIAGNOSIS — M545 Low back pain, unspecified: Secondary | ICD-10-CM

## 2013-10-27 IMAGING — CR DG THORACOLUMBAR SPINE STANDING SCOLIOSIS
1 series · 3 of 3 positions shown · non-contrast
Comparison: CT abdomen pelvis of 05/22/2011

CLINICAL DATA: Back pain

THORACOLUMBAR SCOLIOSIS STUDY - STANDING VIEWS

[Series 1001: view not recorded · 0.40mm/px · 3 of 3 slices shown]
[im 1/3]
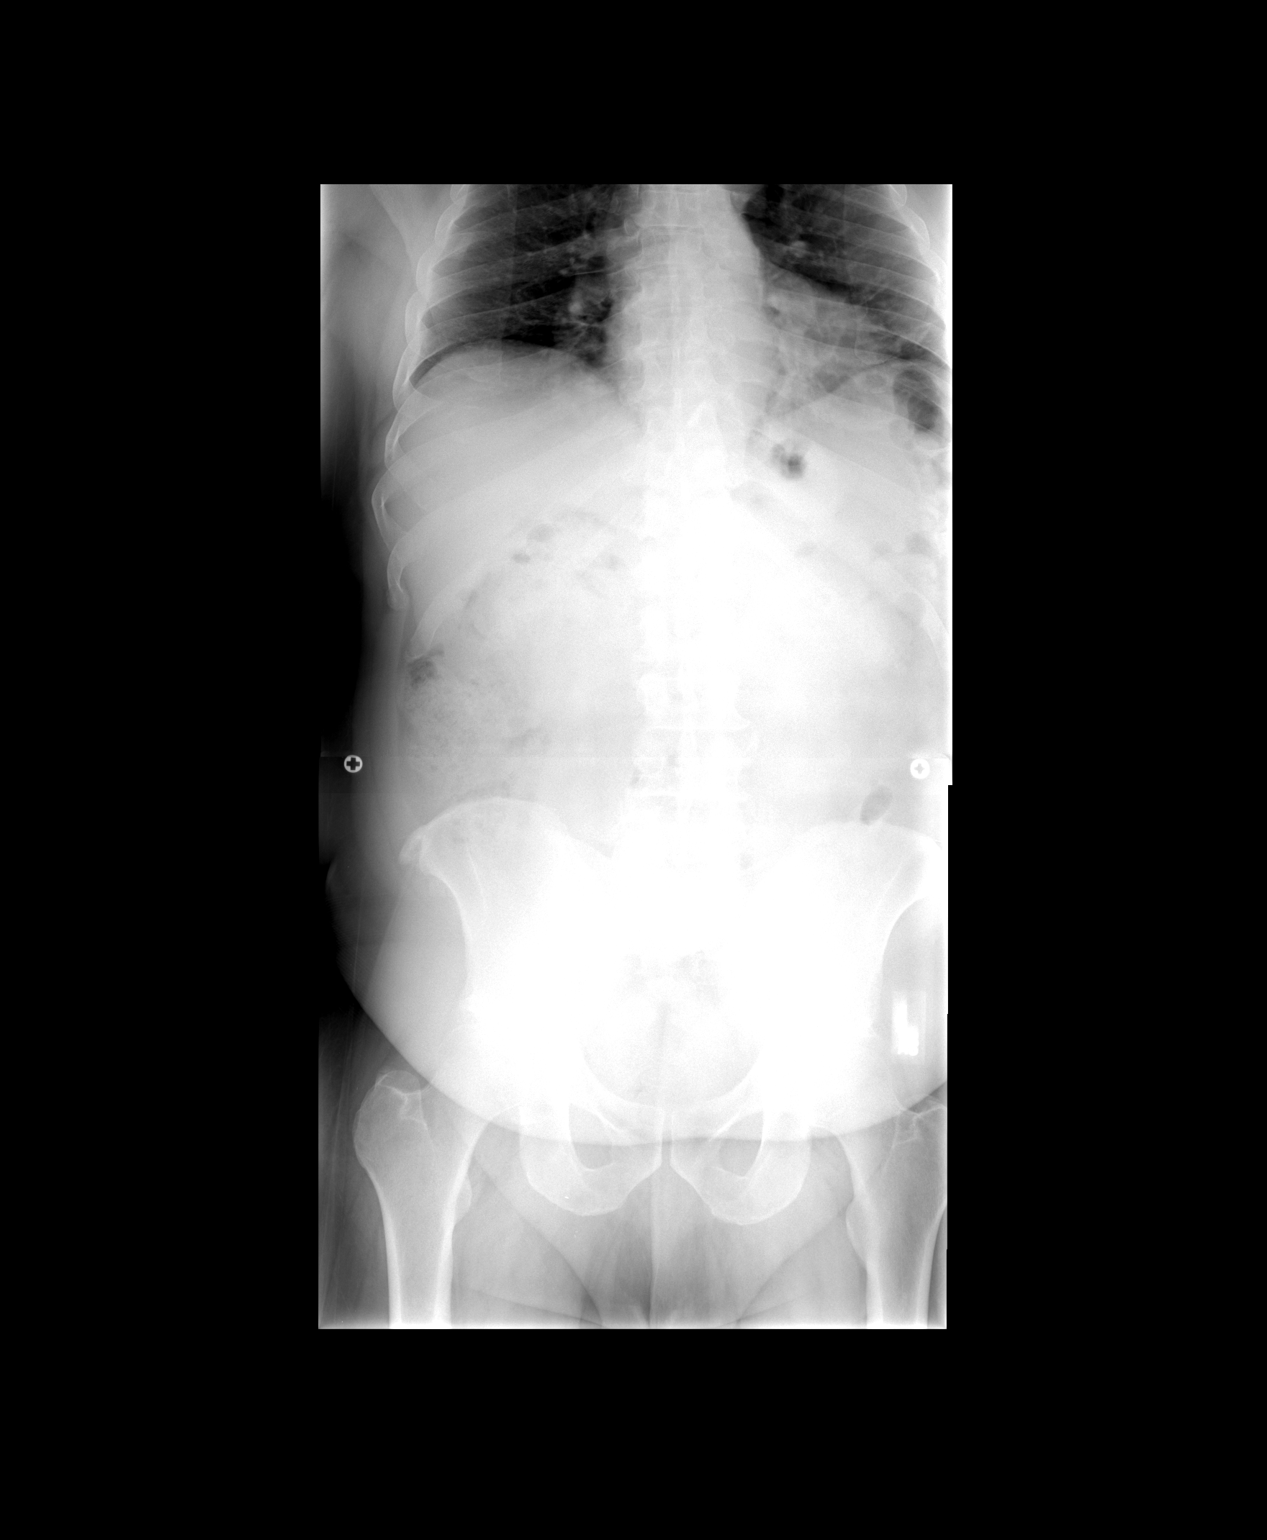
[im 2/3]
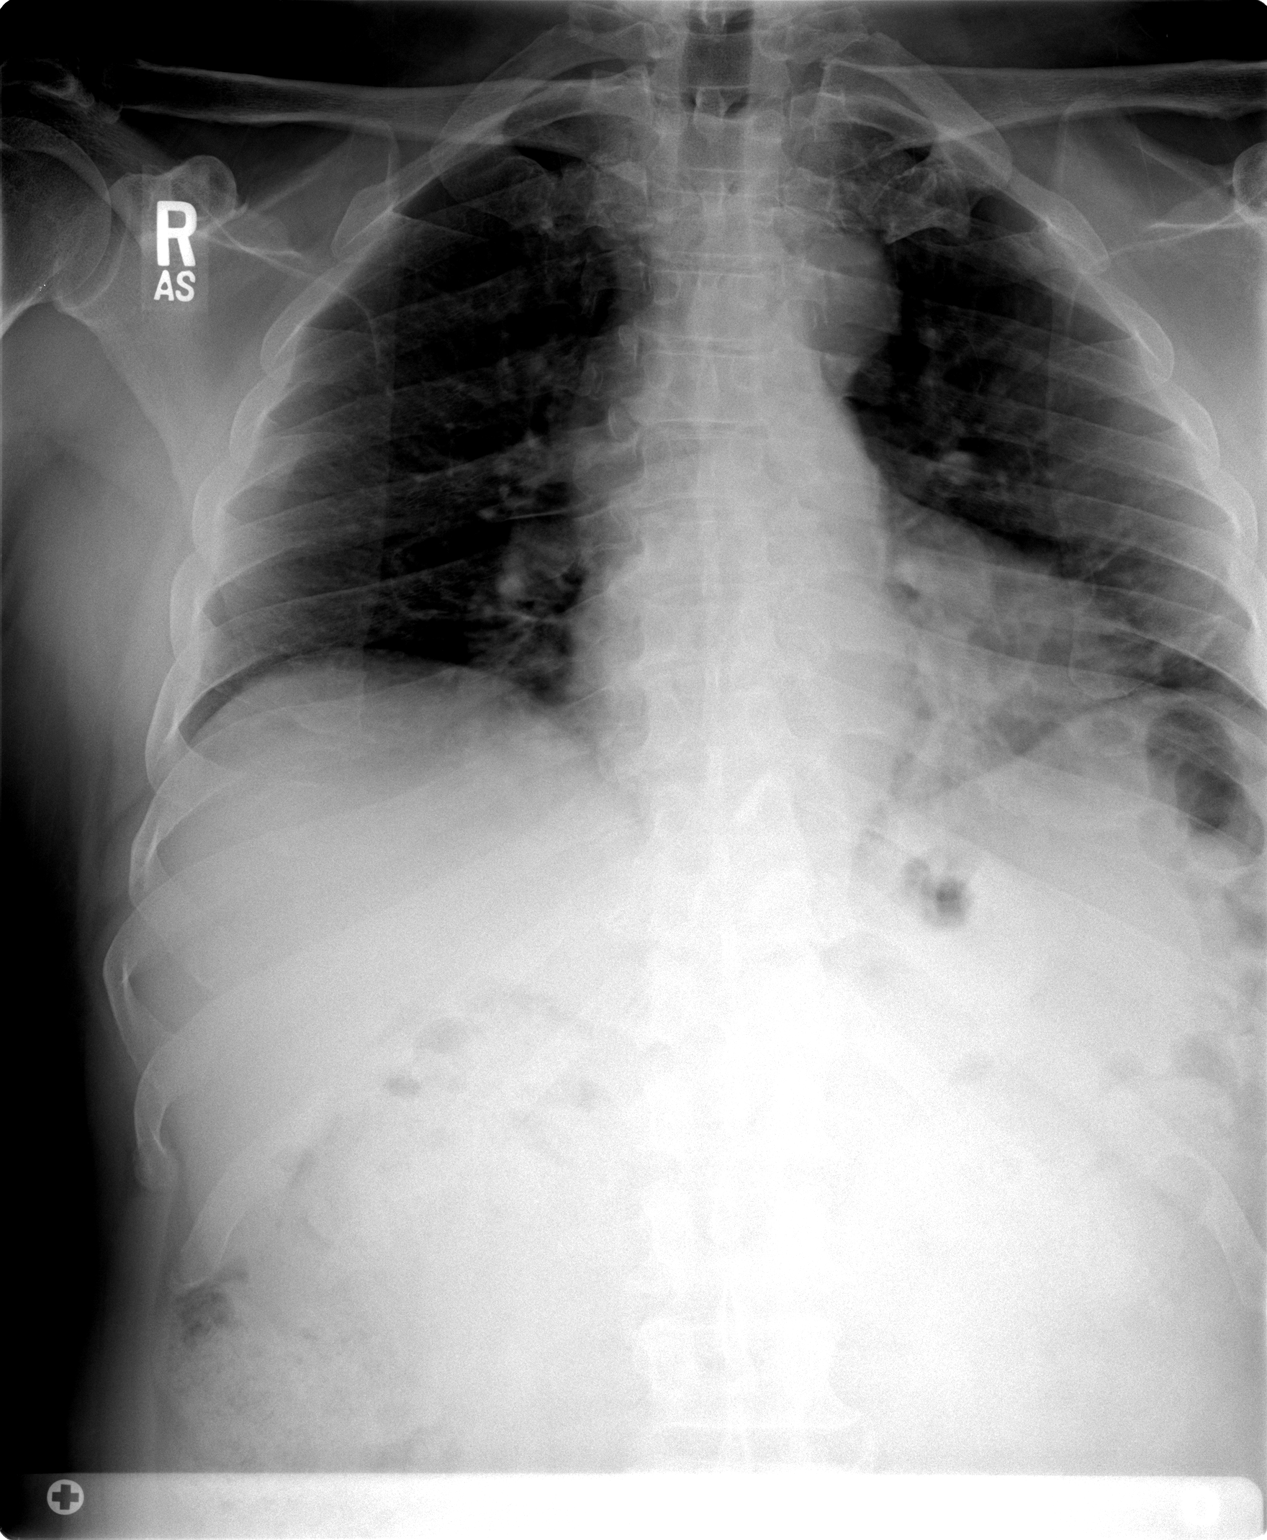
[im 3/3]
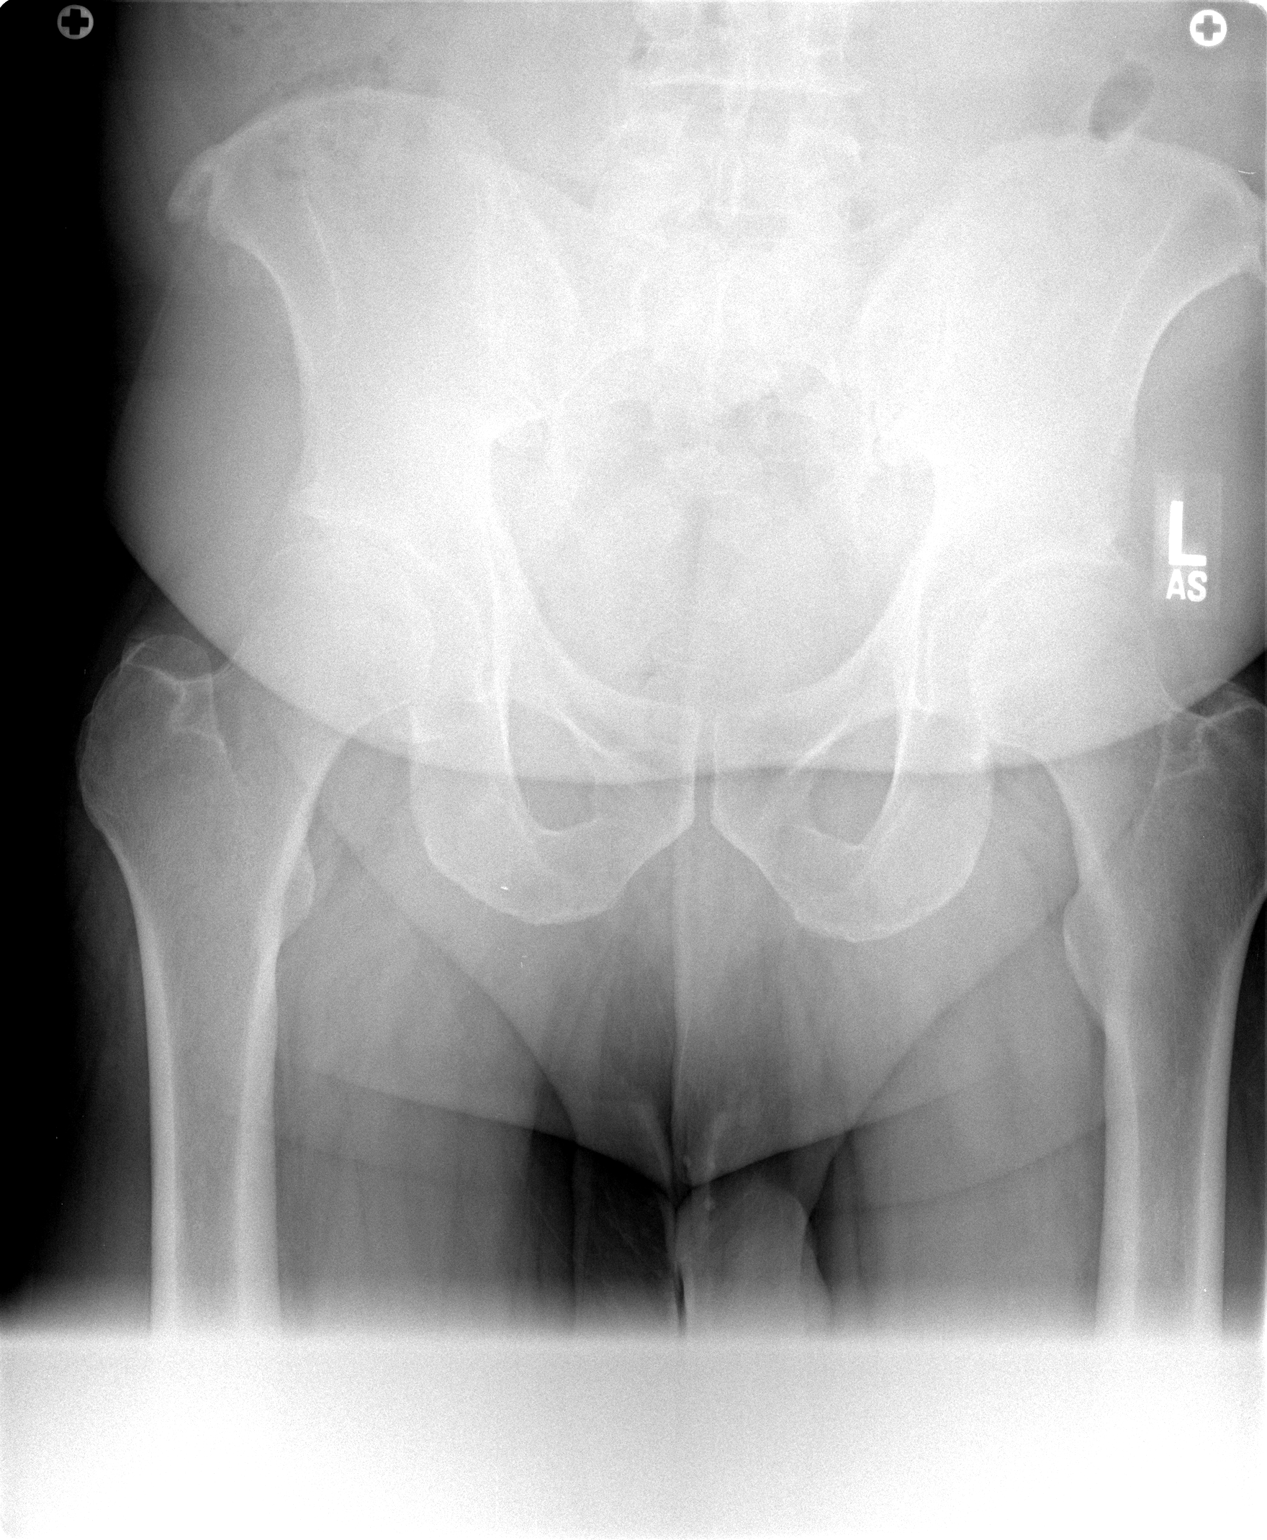

[3 of 3 positions shown; findings below may reference images not displayed]

FINDINGS: The thoracolumbar vertebrae are in normal alignment with
mild degenerative change present.  No compression deformity is
seen.  The bowel gas pattern is nonspecific.
IMPRESSION: Thoracolumbar vertebrae are in normal alignment with mild
degenerative change.

## 2013-10-27 IMAGING — CR DG CERVICAL SPINE 2 OR 3 VIEWS
3 series · 3 of 3 positions shown · non-contrast
Comparison: None.

CLINICAL DATA: Neck pain

CERVICAL SPINE - 2-3 VIEW

[w c-spine lat *]
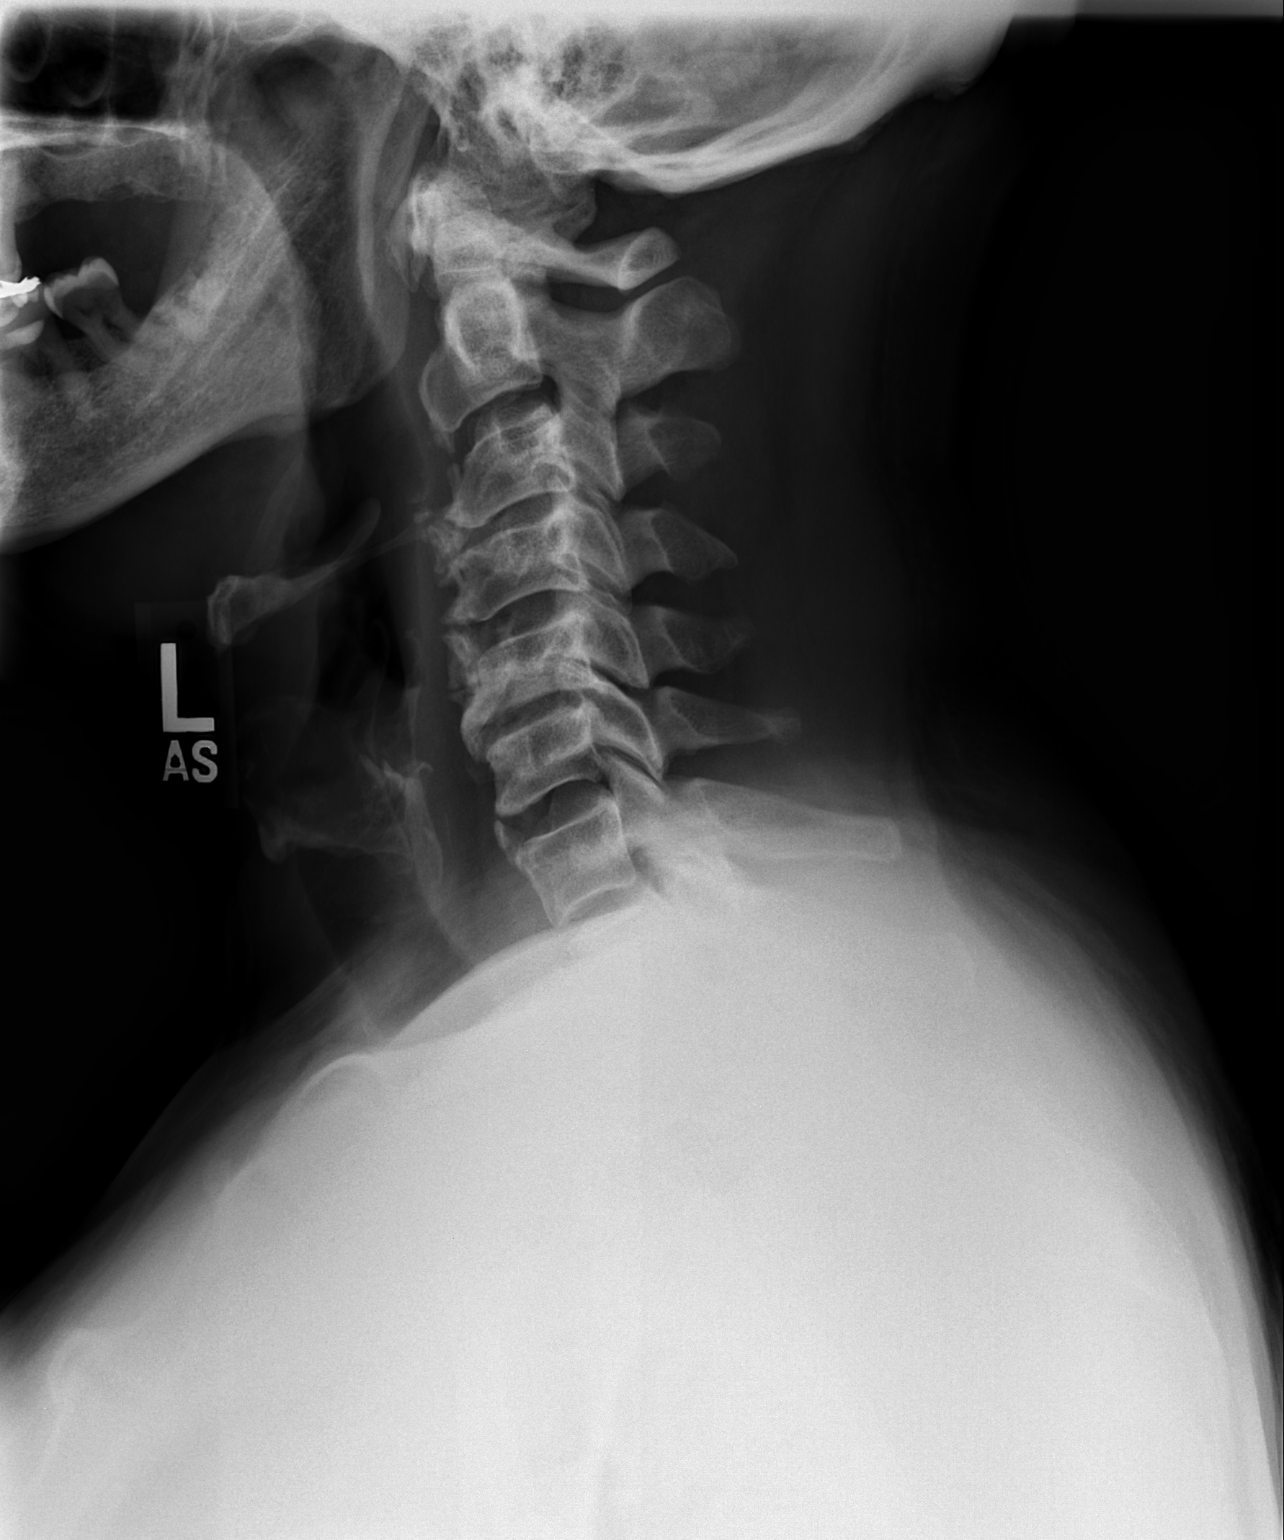

[w c-spine a.p. *]
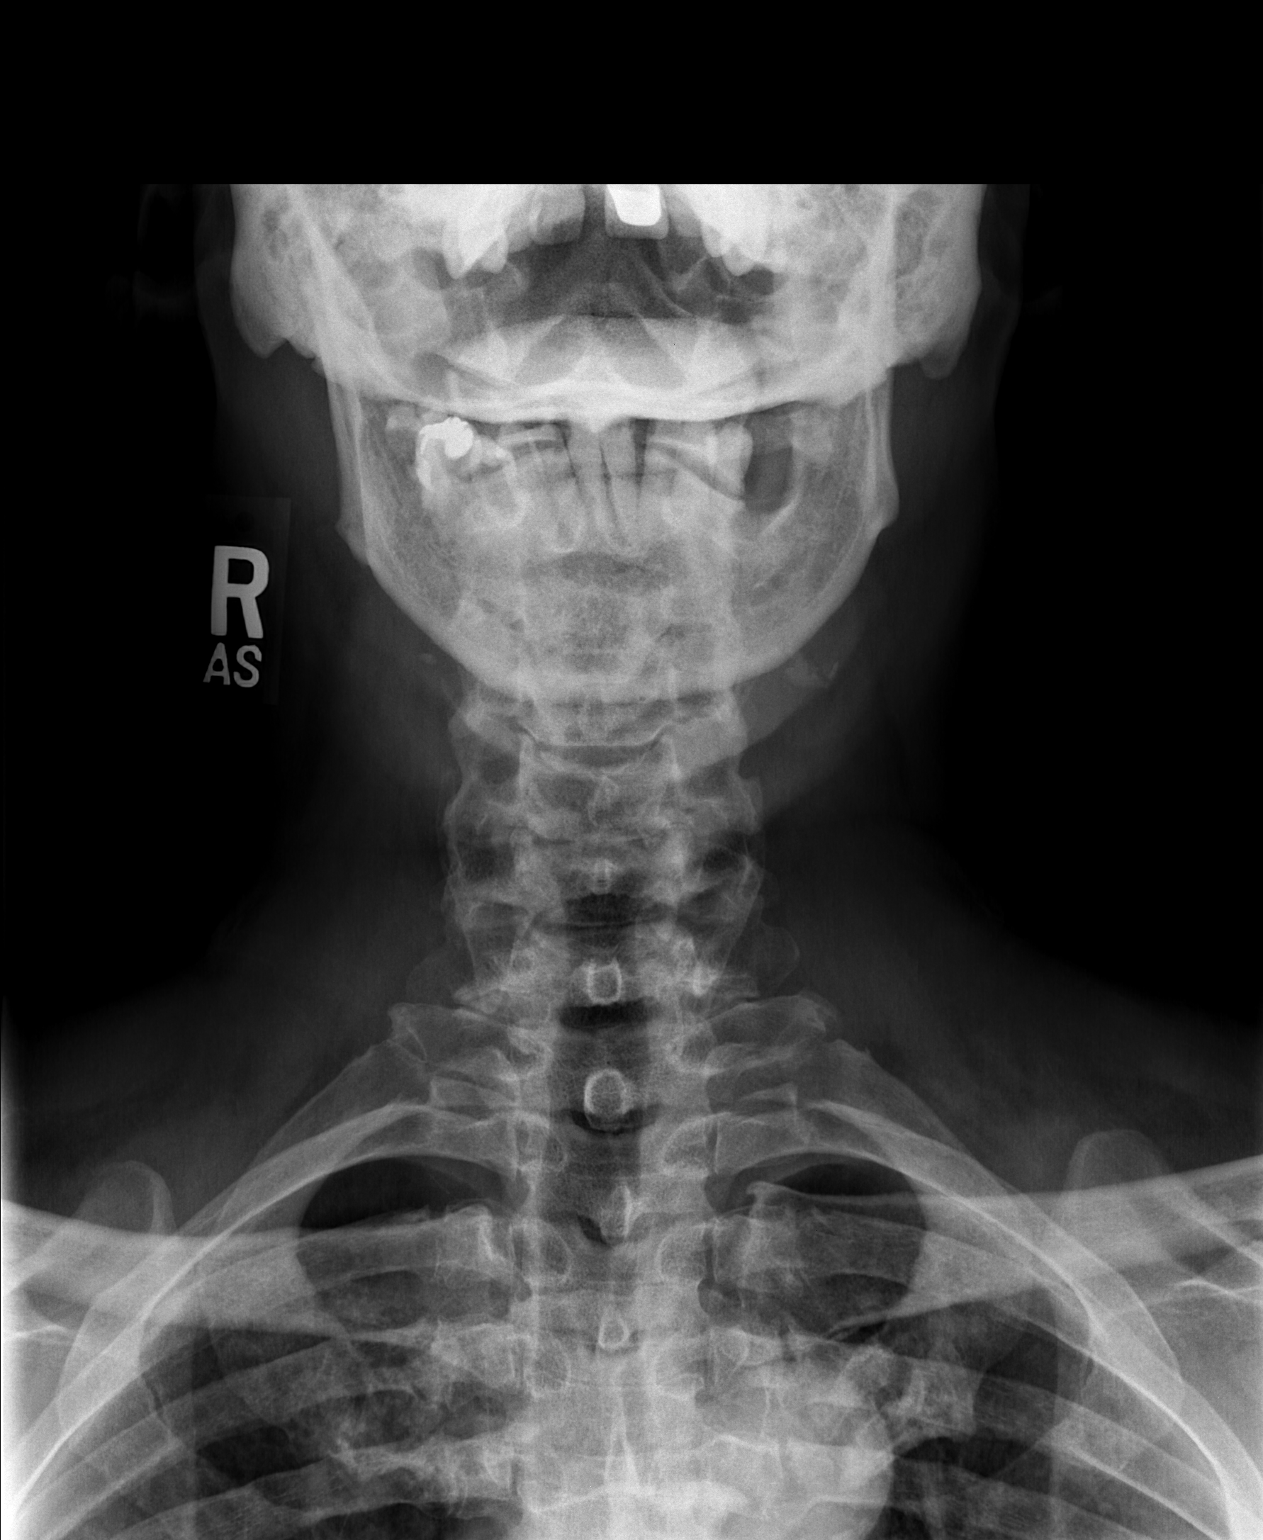

[w c-spine odontoid *]
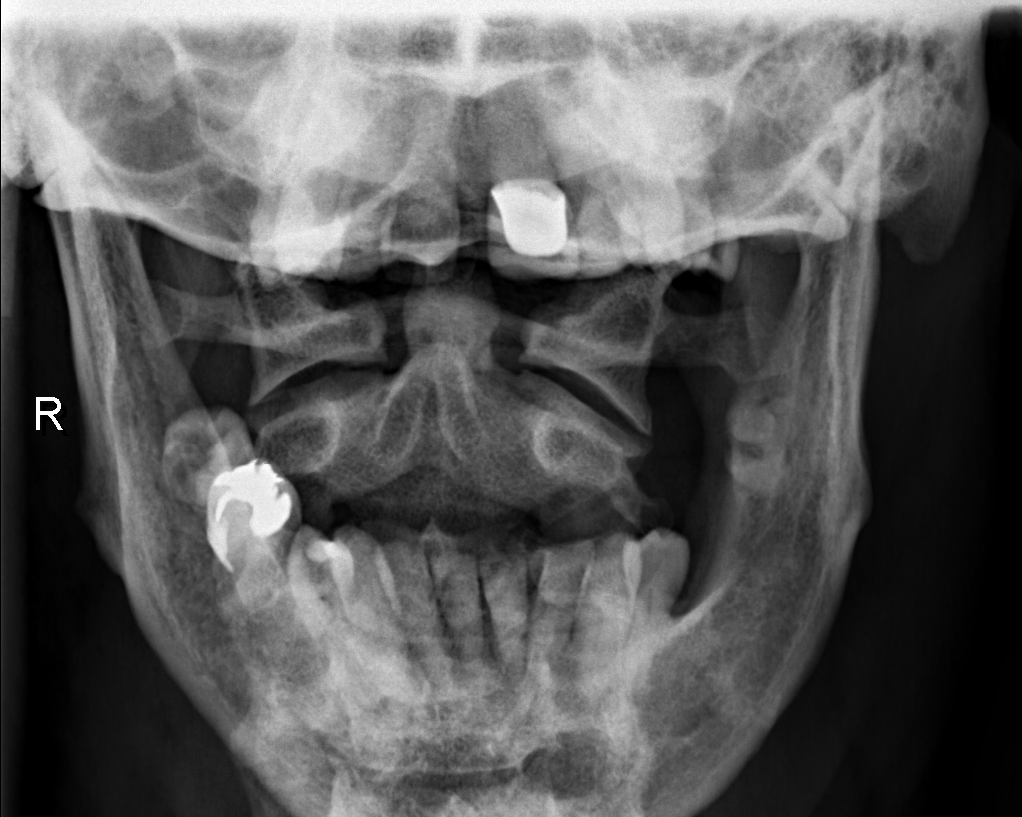

[3 of 3 positions shown; findings below may reference images not displayed]

FINDINGS: The cervical vertebrae are straightened in alignment.
There is degenerative disc disease at C3-4, C4-5, and C5-6 levels
with some loss of disc space and spurring primarily anteriorly.  No
prevertebral soft tissue swelling is seen.  The odontoid process is
intact.  The lung apices are clear.  Oblique views would be helpful
to assess for foraminal narrowing.
IMPRESSION: Straightened alignment with degenerative disc disease at C3-4, C4-
5, and C5-6 levels.

## 2013-10-27 IMAGING — CR DG PELVIS 1-2V
1 series · 1 of 1 positions shown · non-contrast
Comparison: CT abdomen pelvis of 05/22/2011

CLINICAL DATA: Low back pain

PELVIS - 1-2 VIEW

[w pelvis *]
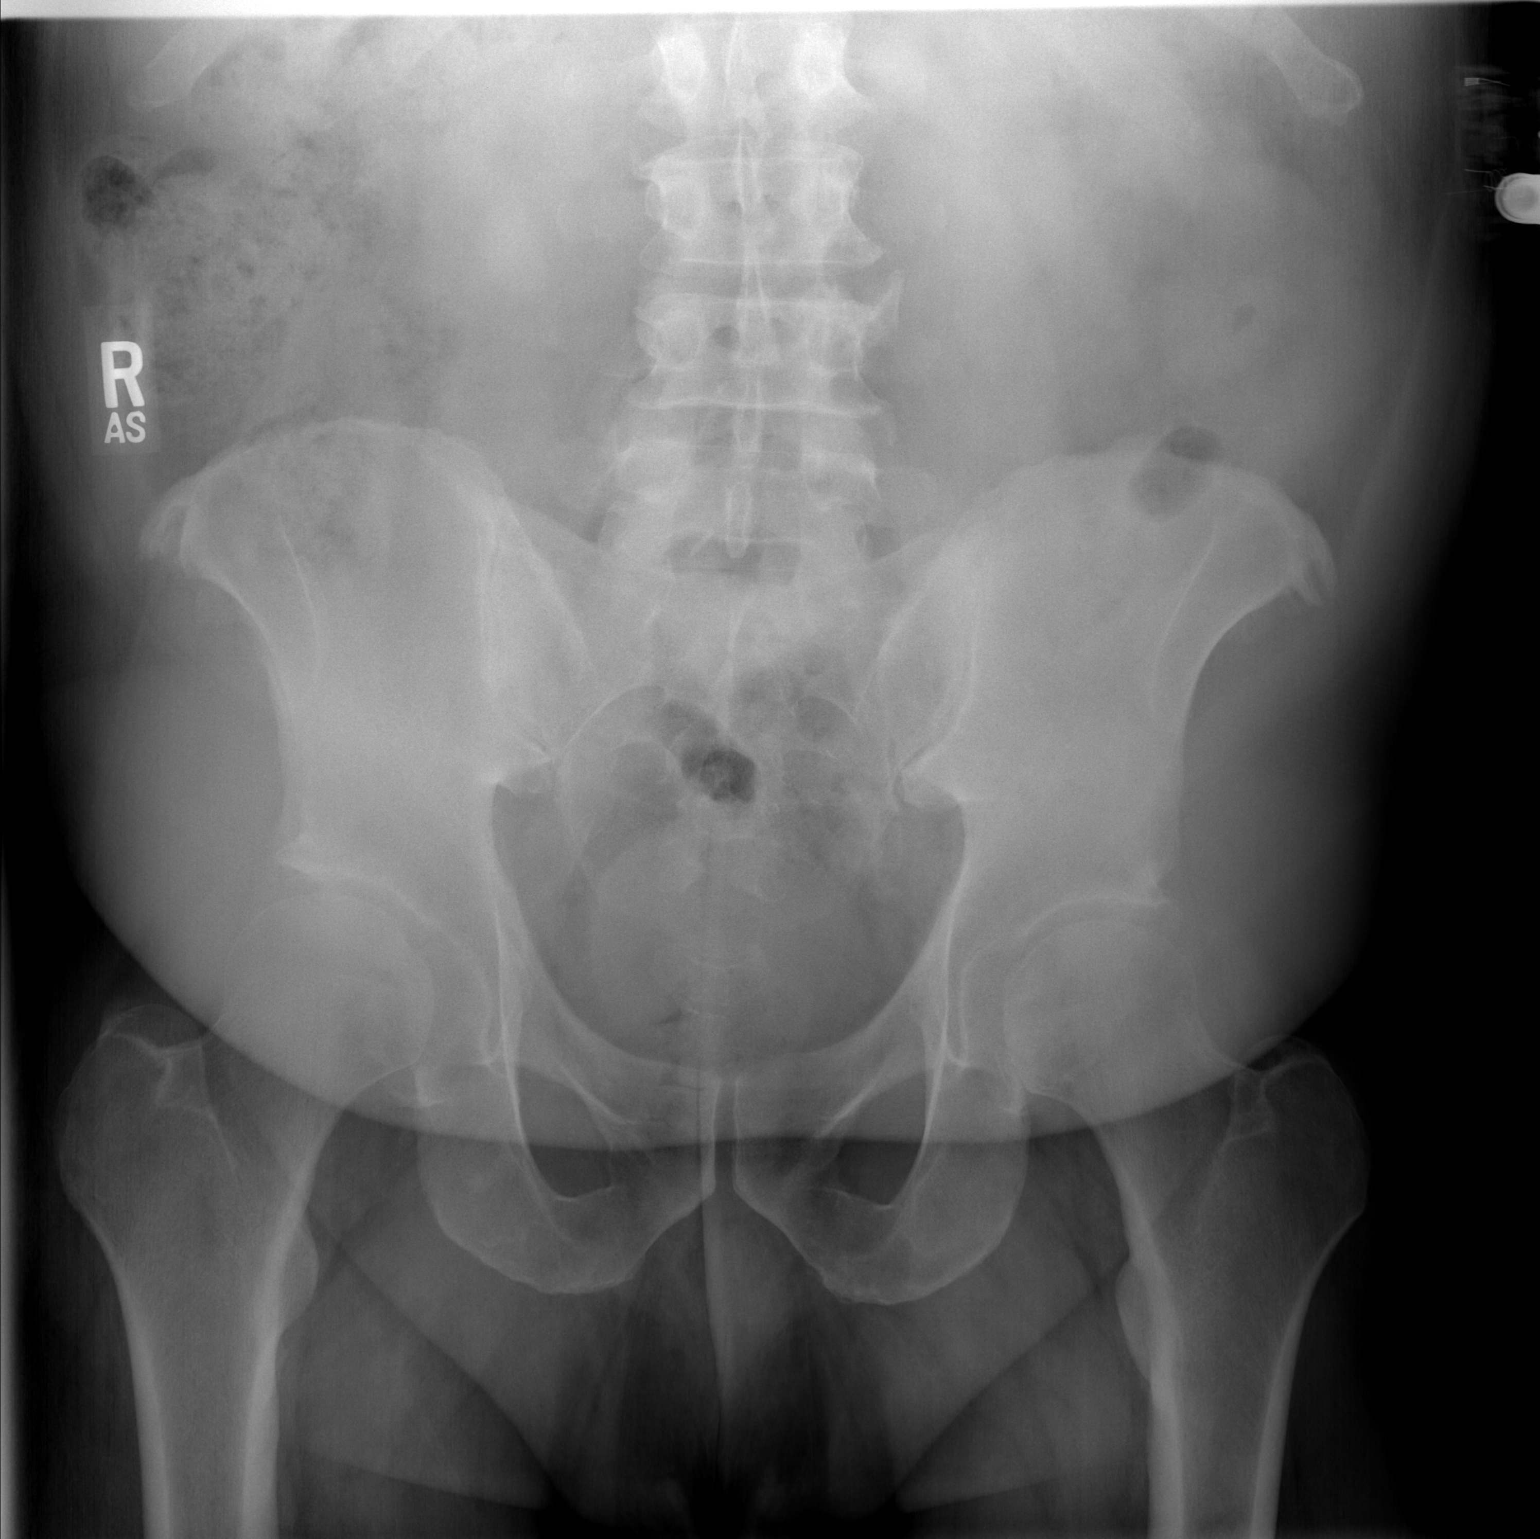

[1 of 1 positions shown; findings below may reference images not displayed]

FINDINGS: Only mild degenerative joint disease of the hips is noted
with some superior acetabular spurring.  The pelvic rami are
intact.  The SI joints are corticated.
IMPRESSION: Only mild degenerative change of the hips is noted.

## 2016-03-15 LAB — BASIC METABOLIC PANEL: Creatinine: 1.5 mg/dL — AB (ref 0.6–1.3)

## 2016-03-15 LAB — LIPID PANEL: LDL Cholesterol: 115 mg/dL

## 2016-03-15 LAB — HEMOGLOBIN A1C: Hemoglobin A1C: 8.5

## 2016-04-19 ENCOUNTER — Ambulatory Visit (INDEPENDENT_AMBULATORY_CARE_PROVIDER_SITE_OTHER): Payer: BC Managed Care – PPO | Admitting: Endocrinology

## 2016-04-19 ENCOUNTER — Encounter: Payer: Self-pay | Admitting: Endocrinology

## 2016-04-19 VITALS — BP 122/78 | HR 54 | Ht 73.0 in | Wt 270.0 lb

## 2016-04-19 DIAGNOSIS — E1165 Type 2 diabetes mellitus with hyperglycemia: Secondary | ICD-10-CM

## 2016-04-19 DIAGNOSIS — E1142 Type 2 diabetes mellitus with diabetic polyneuropathy: Secondary | ICD-10-CM | POA: Diagnosis not present

## 2016-04-19 DIAGNOSIS — I10 Essential (primary) hypertension: Secondary | ICD-10-CM

## 2016-04-19 DIAGNOSIS — E782 Mixed hyperlipidemia: Secondary | ICD-10-CM | POA: Diagnosis not present

## 2016-04-19 DIAGNOSIS — Z794 Long term (current) use of insulin: Secondary | ICD-10-CM

## 2016-04-19 MED ORDER — VICTOZA 18 MG/3ML ~~LOC~~ SOPN: 1.2000 mg | PEN_INJECTOR | Freq: Every day | SUBCUTANEOUS | 3 refills | Status: DC

## 2016-04-19 NOTE — Patient Instructions (Addendum)
Stop Regular soft drinks, cookies and sweets  Take 3 clicks for salads  Extra clicks for snacks  Walk daily  Start VICTOZA injection as shown once daily at the same time of the day.  Dial the dose to 0.6 mg on the pen for the first week.  You may inject in the stomach, thigh or arm. You may experience nausea in the first few days which usually goes away.  You will feel fullness of the stomach with starting the medication and should try to keep the portions at meals small. After 1 week increase the dose to 1.2mg  daily if no nausea present.   If any questions or concerns are present call the office or the [facility] helpline at [phone]. Visit http://www.wall.info/ for more useful information  Check blood sugars on waking up 4x weekly  Also check blood sugars about 2 hours after a meal and do this after different meals by rotation  Recommended blood sugar levels on waking up is 90-130 and about 2 hours after meal is 130-160  Please bring your blood sugar monitor to each visit, thank you
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
## 2016-04-23 ENCOUNTER — Other Ambulatory Visit: Payer: Self-pay

## 2016-04-23 MED ORDER — VICTOZA 18 MG/3ML ~~LOC~~ SOPN: 1.2000 mg | PEN_INJECTOR | Freq: Every day | SUBCUTANEOUS | 3 refills | Status: DC

## 2016-05-11 ENCOUNTER — Encounter: Payer: Self-pay | Admitting: Dietician

## 2016-05-11 ENCOUNTER — Encounter: Payer: BC Managed Care – PPO | Attending: Endocrinology | Admitting: Dietician

## 2016-05-11 DIAGNOSIS — E119 Type 2 diabetes mellitus without complications: Secondary | ICD-10-CM | POA: Diagnosis not present

## 2016-05-11 DIAGNOSIS — E785 Hyperlipidemia, unspecified: Secondary | ICD-10-CM | POA: Diagnosis not present

## 2016-05-11 DIAGNOSIS — Z713 Dietary counseling and surveillance: Secondary | ICD-10-CM | POA: Insufficient documentation

## 2016-05-11 DIAGNOSIS — E118 Type 2 diabetes mellitus with unspecified complications: Secondary | ICD-10-CM

## 2016-05-11 DIAGNOSIS — I10 Essential (primary) hypertension: Secondary | ICD-10-CM | POA: Insufficient documentation

## 2016-05-11 DIAGNOSIS — Z794 Long term (current) use of insulin: Secondary | ICD-10-CM

## 2016-05-11 NOTE — Progress Notes (Addendum)
  Medical Nutrition Therapy:  Appt start time: 2671 end time:  1430.   Assessment:  Primary concerns today: Patient is here alone.  He would like to learn healthier eating habits and is trying to lose weight.  Hx includes type 2 diabetes since 2006, HTN and hyperlipidemia.  Patient uses a v-Go.  He has started on Victoza which has decreased his appetite.  Weight today:  267 lbs.    Wt Readings from Last 3 Encounters:  05/11/16 267 lb (121.1 kg)  04/19/16 270 lb (122.5 kg)  12/02/12 255 lb 15.3 oz (116.1 kg)    Patient lives with her wife and share shopping and his wife cooks.  His wife's 37 yo mother lives with them.  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   Preferred Learning Style:   No preference indicated   Learning Readiness:   Ready  MEDICATIONS:  See list to include Novolg in a v-Go30 and Metformin bid.   DIETARY INTAKE:  Usual eating pattern includes 3 meals and 1-2 snacks per day.  Everyday foods include increased fried and fat due to 52 yo mother-in-law 's preference.  Avoided foods include Splenda (hives).    24-hr recall:  B ( AM): [employer] sausage biscuit or egg McMuffin and Coffee with 2 tsp sugar or stevia  Snk ( AM): none  L ( PM): fish sandwich OR grilled or fried chicken sandwich, occasional small fries, regular soda Snk ( PM): crackers and peanut butter (homemade- 4 crackers) and leftover soda D ( PM): vegetables, fish or chicken (often fried), cornbread Snk ( PM): occasional Klondike bar or ice cream or potato chips Beverages: mostly water, 1 regular soda daily, diet soda, sweet tea  Usual physical activity: small amount of walking and yard work  Estimated energy needs: 1800 calories 200 g carbohydrates 113 g protein 60 g fat  Progress Towards Goal(s):  In progress.   Nutritional Diagnosis:  NB-1.1 Food and nutrition-related knowledge deficit As related to balance of carbohydrate, protein, and fat.  As evidenced by patient report  and diet hx.    Intervention:  Nutrition counseling and diabetes education initiated. Discussed Carb Counting by food group as method of portion control, reading food labels, and benefits of increased activity. Also discussed basic physiology of Diabetes, target BG ranges pre and post meals, and A1c.   Rethink what you drink.  Aim for beverages that have no added sugar.  Avoid dark soda. Begin an active lifestyle.  Aim for 30-60 minutes walking or other exercise most days of the week.  Aim for 3-4 Carb Choices per meal (45-60 grams) +/- 1 either way  Aim for 0-1 Carbs per snack if hungry  Include protein in moderation with your meals and snacks Consider reading food labels for Total Carbohydrate and Fat Grams of foods Consider checking BG at alternate times per day as directed by MD  Consider taking medication as directed by MD  Teaching Method Utilized:  Visual Auditory Hands on  Handouts given during visit include: Living Well with Diabetes Food Label handouts Meal Plan Card  Making healthy fast food choices  Breakfast  A1C sheet  Snack list  Barriers to learning/adherence to lifestyle change: none  Demonstrated degree of understanding via:  Teach Back   Monitoring/Evaluation:  Dietary intake, exercise, and body weight prn.

## 2016-05-11 NOTE — Patient Instructions (Signed)
Rethink what you drink.  Aim for beverages that have no added sugar.  Avoid dark soda. Begin an active lifestyle.  Aim for 30-60 minutes walking or other exercise most days of the week.  Aim for 3-4 Carb Choices per meal (45-60 grams) +/- 1 either way  Aim for 0-1 Carbs per snack if hungry  Include protein in moderation with your meals and snacks Consider reading food labels for Total Carbohydrate and Fat Grams of foods Consider checking BG at alternate times per day as directed by MD  Consider taking medication as directed by MD   -

## 2016-05-15 ENCOUNTER — Other Ambulatory Visit: Payer: Self-pay

## 2016-05-22 ENCOUNTER — Ambulatory Visit: Payer: Self-pay | Admitting: Endocrinology

## 2016-05-23 ENCOUNTER — Ambulatory Visit: Payer: Self-pay | Admitting: Endocrinology

## 2016-05-23 DIAGNOSIS — Z0289 Encounter for other administrative examinations: Secondary | ICD-10-CM

## 2017-01-18 ENCOUNTER — Telehealth: Payer: Self-pay | Admitting: Endocrinology

## 2017-01-18 NOTE — Telephone Encounter (Signed)
Please advise if okay to refill. Last OV on 04/19/2016 and no follow up scheduled.

## 2017-01-18 NOTE — Telephone Encounter (Signed)
No refills until he makes an appointment

## 2017-04-16 ENCOUNTER — Other Ambulatory Visit: Payer: Self-pay | Admitting: Internal Medicine

## 2017-04-16 DIAGNOSIS — I10 Essential (primary) hypertension: Secondary | ICD-10-CM

## 2017-04-16 DIAGNOSIS — M79606 Pain in leg, unspecified: Secondary | ICD-10-CM

## 2017-04-25 ENCOUNTER — Other Ambulatory Visit: Payer: Self-pay

## 2017-05-23 ENCOUNTER — Telehealth: Payer: Self-pay | Admitting: Endocrinology

## 2017-05-23 NOTE — Telephone Encounter (Signed)
Please advise if okay to fill? Last OV 03/2016

## 2017-05-23 NOTE — Telephone Encounter (Signed)
Needs to be seen for any new prescriptions

## 2017-05-23 NOTE — Telephone Encounter (Signed)
Medication has been refused with a note for him to make an appointment.

## 2017-07-09 ENCOUNTER — Encounter (HOSPITAL_COMMUNITY): Admission: RE | Payer: Self-pay | Source: Ambulatory Visit

## 2017-07-09 ENCOUNTER — Ambulatory Visit (HOSPITAL_COMMUNITY): Admission: RE | Admit: 2017-07-09 | Payer: BC Managed Care – PPO | Source: Ambulatory Visit | Admitting: Cardiology

## 2017-07-09 SURGERY — LOWER EXTREMITY ANGIOGRAPHY
Anesthesia: LOCAL

## 2017-08-04 DIAGNOSIS — I739 Peripheral vascular disease, unspecified: Secondary | ICD-10-CM | POA: Diagnosis present

## 2017-08-04 NOTE — H&P (Signed)
OFFICE VISIT NOTES COPIED TO EPIC FOR DOCUMENTATION  . History of Present Illness [name] Page MD; 07/20/2017 8:55 AM) Patient words: Last O/V 06/21/2017; F/U to discuss options per pt.  The patient is a 67 year old male who presents for a Follow-up for Abnormal EKG.  Additional reasons for visit:  Follow-up for Peripheral vascular disease is described as the following: [name] is an AA male who was referred to me by Dr. [name] for evaluation of abnormal EKG. 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 He also has past history of tobacco use disorder, moderate obesity, and has had history of noncompliance in the past. 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  He does not exercise regularly. Reports that he tried to exercise earlier in the year; however, developed claudication symptoms that made him stop. He does have dyspnea on exertion. Denies any chest pain, edema, or syncope.  Leg cramps have become worse over the past few months. Lower extremity arterial duplex revealed severely decreased ABI, left on 05/10/17 suggestive of left iliac stenosis. After trauma offered door handle having hit his left leg in the lateral part of the calf, he has persistent ulcer over the past 6 months that has not healed. He is now accompanied by his wife to discuss options.   Problem List/Past Medical (April Harrington; 07/21/2017 2:40 PM) Hyperlipidemia, mixed (E78.2)  Arthritis (M19.90)  Sleep apnea (G47.30)  Benign essential hypertension (I10)  Abnormal EKG (R94.31)  EKG 04/24/2017: Sinus bradycardia at rate of 51 bpm, normal axis. No evidence of ischemia.  Low voltage compresses. Compared to 04/11/2017, occasional PACs and baseline wander no longer present. Laboratory examination (Z01.89)  HB 2.9/HCT 41.2, platelets 198. Potassium 4.4, BUN 14, creatinine 1.4, eGFR 51/61 mL. CMP otherwise normal. Cholesterol 112, triglycerides 86, HDL 36, LDL 59. Uncontrolled diabetes mellitus with stage 3 chronic kidney disease (E11.22, E11.65)  Dyspnea on exertion (R06.09)  Echocardiogram 05/23/2017: Left ventricle cavity is normal in size. Moderate concentric hypertrophy of the left ventricle. Normal global wall motion. Doppler evidence of grade I (impaired) diastolic dysfunction, normal LAP. Calculated EF 55%. No significant valvular abnormalities. No evidence of pulmonary hypertension. Right carotid bruit (R09.89)  Carotid artery duplex 05/23/2017: Minimal stenosis in the right internal carotid artery (minimal). Stenosis in the left internal carotid artery (50-69%), lower end of spectrum. Antegrade right vertebral artery flow. Antegrade left vertebral artery flow. Follow up in six months is appropriate if clinically indicated. 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 This exam reveals mildly decreased perfusion of the right lower extremity, noted at the dorsalis pedis artery level (ABI) with RABI 0.93. Diffuse monophasic waveform through out the left leg and suggests significant inflow (iliac) artery stenosis. Left PT appears occluded. This exam reveals severely decreased perfusion of the left lower extremity, noted at the dorsalis pedis artery level (ABI) with LABI 0.44.  Allergies (April Harrington; 07-21-2017 2:40 PM) No Known Drug Allergies [04/24/2017]:  Family History (April Harrington; Jul 21, 2017 2:40 PM) Mother  Deceased. diabetic complications, stroke, narcolepsy, htn, diet at age 89 years Father   Deceased. Died at age  57 years - dementia, heart attack at age 7 years Sister 1  younger by 3 years, healthy. One sister passed at age 67 years from over medication Brother 2  2 brothers younger by 4 years died at cardiac arrest at late 67 years and another brother younger by 18 years Family history unknown - Adopted   Social History (April Harrington; 07/19/2017 2:40 PM) Current tobacco use  Former smoker. quit 1999 1 ppd Alcohol Use  Occasional alcohol use, Drinks beer, Drinks hard liquor. Marital status  Married. Living Situation  Lives with spouse. Number of Children  2.  Past Surgical History (April Harrington; 07/19/2017 2:40 PM) Tonsillectomy  at age 67 Nasal Sinusotomy [2000]:  Medication History (April Harrington; 07/19/2017 2:46 PM) Aspirin (81MG Tablet Chewable, 1 (one) Tablet Tablet Oral daily, Taken starting 04/24/2017) Active. Metanx (1 Oral two times daily) Specific strength unknown - Active. MetFORMIN HCl (1000MG Tablet, 1 Oral two times daily) Active. Pravastatin Sodium (40MG Tablet, 1 Oral daily) Active. Triamterene-HCTZ (37.5-25MG Tablet, 1 Oral daily) Active. Carvedilol (12.5MG Tablet, 1 Oral daily) Active. Losartan Potassium (50MG Tablet, 1 Oral daily) Active. Dorzolamide HCl-Timolol Mal (22.3-6.8MG/ML Solution, Ophthalmic two times daily) Active. Travatan Z (0.004% Solution, Ophthalmic daily) Active. NovoLOG (100UNIT/ML Solution, pump Subcutaneous daily) Active. Victoza (18MG/3ML Soln Pen-inj, 1.38m Subcutaneous daily) Active. Medications Reconciled (verbally with pt)  Diagnostic Studies History (April Harrington; 07/19/2017 2:45 PM) Echocardiogram [05/23/2017]: Left ventricle cavity is normal in size. Moderate concentric hypertrophy of the left ventricle. Normal global wall motion. Doppler evidence of grade I (impaired) diastolic dysfunction, normal LAP. Calculated EF 55%. No significant valvular abnormalities. No evidence of pulmonary  hypertension. Carotid Doppler [05/23/2017]: Minimal stenosis in the right internal carotid artery (minimal). Stenosis in the left internal carotid artery (50-69%), lower end of spectrum. Antegrade right vertebral artery flow. Antegrade left vertebral artery flow. Follow up in six months is appropriate if clinically indicated. Nuclear stress test [05/13/2017]: 1. The resting electrocardiogram demonstrated normal sinus rhythm, normal resting conduction and no resting arrhythmias. Stress EKG is non-diagnostic for ischemia as it a pharmacologic stress using Lexiscan. Stress symptoms included dyspnea, nausea, dizziness. 2. Myocardial perfusion imaging is normal. Overall left ventricular systolic function was normal without regional wall motion abnormalities. The left ventricular ejection fraction was normal visually but mildly depressed at 45%. This is a low risk study. Lower Extremity Dopplers [05/10/2017]: There is biphasic waveforms noted throughout the right leg from femoral artery level down suggests diffuse disease. This exam reveals mildly decreased perfusion of the right lower extremity, noted at the dorsalis pedis artery level (ABI) with RABI 0.93. Diffuse monophasic waveform through out the left leg and suggests significant inflow (iliac) artery stenosis. Left PT appears occluded. This exam reveals severely decreased perfusion of the left lower extremity, noted at the dorsalis pedis artery level (ABI) with LABI 0.44.    Review of Systems ([name] Page MD; 07/20/2017 8:59 AM) General Not Present- Appetite Loss and Weight Gain. Respiratory Not Present- Chronic Cough and Wakes up from Sleep Wheezing or Short of Breath. Cardiovascular Present- Claudications (left leg with exertion) and Difficulty Breathing On Exertion. Not Present- Chest Pain, Difficulty Breathing Lying Down and Edema. Gastrointestinal Not Present- Black, Tarry Stool and Difficulty Swallowing. Musculoskeletal Not Present-  Decreased Range of Motion and Muscle Atrophy. Neurological Not Present- Attention Deficit. Psychiatric Not Present- Personality Changes and Suicidal Ideation. Endocrine Not Present- Cold Intolerance and Heat Intolerance. Hematology Not Present- Abnormal Bleeding. All other systems negative  Vitals (April Harrington; 07/19/2017 2:48 PM) 07/19/2017  2:42 PM Weight: 268.13 lb Height: 74in Body Surface Area: 2.46 m Body Mass Index: 34.42 kg/m  Pulse: 68 (Regular)  P.OX: 96% (Room air) BP: 148/80 (Sitting, Left Arm, Standard)       Physical Exam [name] Page, MD; 07/20/2017 8:59 AM) General Mental Status-Alert. General Appearance-Cooperative and Appears stated age. Build & Nutrition-Well built and Moderately obese.  Head and Neck Thyroid Gland Characteristics - normal size and consistency and no palpable nodules.  Chest and Lung Exam Chest and lung exam reveals -quiet, even and easy respiratory effort with no use of accessory muscles, non-tender and on auscultation, normal breath sounds, no adventitious sounds.  Cardiovascular Cardiovascular examination reveals -normal heart sounds, regular rate and rhythm with no murmurs, carotid auscultation reveals no bruits and abdominal aorta auscultation reveals no bruits and no prominent pulsation.  Abdomen Palpation/Percussion Normal exam - Non Tender and No hepatosplenomegaly.  Peripheral Vascular Lower Extremity Inspection - Left - Ischemic(superficial wound chronic left lateral calf) and Pigmented. Bilateral - Rapid capillary refill. Palpation - Femoral pulse - Bilateral - 2+. Popliteal pulse - Bilateral - Absent. Dorsalis pedis pulse - Bilateral - Absent. Posterior tibial pulse - Bilateral - Absent. Carotid arteries - Right-Soft Bruit. Carotid arteries - Bilateral-No Carotid bruit.  Neurologic Neurologic evaluation reveals -alert and oriented x 3 with no impairment of recent or remote  memory. Motor-Grossly intact without any focal deficits.  Musculoskeletal Global Assessment Left Lower Extremity - no deformities, masses or tenderness, no known fractures. Right Lower Extremity - no deformities, masses or tenderness, no known fractures.    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 This exam reveals mildly decreased perfusion of the right lower extremity, noted at the dorsalis pedis artery level (ABI) with RABI 0.93. Diffuse monophasic waveform through out the left leg and suggests significant inflow (iliac) artery stenosis. Left PT appears occluded. This exam reveals severely decreased perfusion of the left lower extremity, noted at the dorsalis pedis artery level (ABI) with LABI 0.44. Uncontrolled diabetes mellitus with stage 3 chronic kidney disease (E11.22) Hyperlipidemia, mixed (E78.2) Bilateral leg edema (R60.0) Laboratory examination (N82.95) Story:  08/01/2017: Glucose 178, creatinine 1.3, EGFR 56/65, potassium 4.1, BMP normal.  CBC normal.  INR 1.0, prothrombin time 10.4.  Note:. Recommendations:  Patient was seen by me about a month ago for nonhealing left calf ulcer and severe peripheral arterial disease, and after long discussions and discussing the findings of the ABI, I had recommended peripheral angiography. He now brings his wife, who has knowledge of medicine as she is a caregiver to patient clients. I had a long discussion regarding risks and benefits, procedural description, and advised them they could also get a 2nd opinion regarding PAD and revascularization.  I also described to them that patient also probably has venous claudication and venous insufficiency of bilateral lower extremities. This will need to be further evaluated  once arterial issues are taking care of.  I also had a very long discussion regarding modification of his diet and calorie reduction, weight loss. Lipids are at goal. BP is elevated today, will address this later.  Needs to schedule for peripheral arteriogram and possible angioplasty given symptoms. Patient understands the risks, benefits, alternatives including medical therapy, CT angiography. Patient understands <1-2% risk of death, embolic complications, bleeding, infection, renal failure, urgent surgical revascularization, but not  limited to these and wants to proceed. This was a greater than 40 minute office visit with greater than 50% of the time spent with face-to-face encounter with patient and evaluation of complex medical issues and coordination of care.  CC: Dr. [name]    Signed by [name] Page, MD (07/20/2017 9:00 AM)

## 2017-08-06 ENCOUNTER — Ambulatory Visit (HOSPITAL_COMMUNITY)
Admission: RE | Disposition: A | Discharge status: Home / Self Care | Payer: Self-pay | Source: Ambulatory Visit | Attending: Cardiology

## 2017-08-06 ENCOUNTER — Ambulatory Visit (HOSPITAL_COMMUNITY)
Admission: RE | Admit: 2017-08-06 | Discharge: 2017-08-06 | Disposition: A | Discharge status: Home / Self Care | Payer: BC Managed Care – PPO | Source: Ambulatory Visit | Attending: Cardiology | Admitting: Cardiology

## 2017-08-06 ENCOUNTER — Encounter (HOSPITAL_COMMUNITY): Payer: Self-pay | Admitting: Cardiology

## 2017-08-06 DIAGNOSIS — E11319 Type 2 diabetes mellitus with unspecified diabetic retinopathy without macular edema: Secondary | ICD-10-CM | POA: Insufficient documentation

## 2017-08-06 DIAGNOSIS — I70243 Atherosclerosis of native arteries of left leg with ulceration of ankle: Secondary | ICD-10-CM | POA: Diagnosis not present

## 2017-08-06 DIAGNOSIS — E1165 Type 2 diabetes mellitus with hyperglycemia: Secondary | ICD-10-CM | POA: Diagnosis not present

## 2017-08-06 DIAGNOSIS — G4733 Obstructive sleep apnea (adult) (pediatric): Secondary | ICD-10-CM | POA: Diagnosis not present

## 2017-08-06 DIAGNOSIS — Z87891 Personal history of nicotine dependence: Secondary | ICD-10-CM | POA: Diagnosis not present

## 2017-08-06 DIAGNOSIS — Z6834 Body mass index (BMI) 34.0-34.9, adult: Secondary | ICD-10-CM | POA: Insufficient documentation

## 2017-08-06 DIAGNOSIS — M1990 Unspecified osteoarthritis, unspecified site: Secondary | ICD-10-CM | POA: Insufficient documentation

## 2017-08-06 DIAGNOSIS — N183 Chronic kidney disease, stage 3 (moderate): Secondary | ICD-10-CM | POA: Insufficient documentation

## 2017-08-06 DIAGNOSIS — R9431 Abnormal electrocardiogram [ECG] [EKG]: Secondary | ICD-10-CM | POA: Diagnosis not present

## 2017-08-06 DIAGNOSIS — L97321 Non-pressure chronic ulcer of left ankle limited to breakdown of skin: Secondary | ICD-10-CM | POA: Diagnosis present

## 2017-08-06 DIAGNOSIS — Z9641 Presence of insulin pump (external) (internal): Secondary | ICD-10-CM | POA: Insufficient documentation

## 2017-08-06 DIAGNOSIS — Z794 Long term (current) use of insulin: Secondary | ICD-10-CM | POA: Insufficient documentation

## 2017-08-06 DIAGNOSIS — I7092 Chronic total occlusion of artery of the extremities: Secondary | ICD-10-CM | POA: Insufficient documentation

## 2017-08-06 DIAGNOSIS — E1151 Type 2 diabetes mellitus with diabetic peripheral angiopathy without gangrene: Secondary | ICD-10-CM | POA: Insufficient documentation

## 2017-08-06 DIAGNOSIS — E782 Mixed hyperlipidemia: Secondary | ICD-10-CM | POA: Diagnosis not present

## 2017-08-06 DIAGNOSIS — E669 Obesity, unspecified: Secondary | ICD-10-CM | POA: Diagnosis not present

## 2017-08-06 DIAGNOSIS — E1122 Type 2 diabetes mellitus with diabetic chronic kidney disease: Secondary | ICD-10-CM | POA: Insufficient documentation

## 2017-08-06 DIAGNOSIS — Z7982 Long term (current) use of aspirin: Secondary | ICD-10-CM | POA: Diagnosis not present

## 2017-08-06 DIAGNOSIS — I739 Peripheral vascular disease, unspecified: Secondary | ICD-10-CM | POA: Diagnosis present

## 2017-08-06 DIAGNOSIS — I129 Hypertensive chronic kidney disease with stage 1 through stage 4 chronic kidney disease, or unspecified chronic kidney disease: Secondary | ICD-10-CM | POA: Diagnosis not present

## 2017-08-06 HISTORY — PX: PERIPHERAL VASCULAR ATHERECTOMY: CATH118256

## 2017-08-06 HISTORY — PX: LOWER EXTREMITY ANGIOGRAPHY: CATH118251

## 2017-08-06 HISTORY — PX: ABDOMINAL AORTOGRAM: CATH118222

## 2017-08-06 LAB — POCT ACTIVATED CLOTTING TIME
Activated Clotting Time: 235 seconds
Activated Clotting Time: 235 seconds
Activated Clotting Time: 263 seconds

## 2017-08-06 LAB — GLUCOSE, CAPILLARY: Glucose-Capillary: 88 mg/dL (ref 65–99)

## 2017-08-06 SURGERY — LOWER EXTREMITY ANGIOGRAPHY
Anesthesia: LOCAL

## 2017-08-06 MED ORDER — SODIUM CHLORIDE 0.9 % WEIGHT BASED INFUSION: 1.0000 mL/kg/h | INTRAVENOUS | Status: DC

## 2017-08-06 MED ORDER — SODIUM CHLORIDE 0.9 % IV BOLUS (SEPSIS)
500.0000 mL | Freq: Once | INTRAVENOUS | Status: AC
  Administered 2017-08-06: 500 mL via INTRAVENOUS

## 2017-08-06 MED ORDER — MIDAZOLAM HCL 2 MG/2ML IJ SOLN
INTRAMUSCULAR | Status: AC
  Filled 2017-08-06: qty 2

## 2017-08-06 MED ORDER — CLOPIDOGREL BISULFATE 75 MG PO TABS: 300.0000 mg | ORAL_TABLET | Freq: Once | ORAL | Status: DC

## 2017-08-06 MED ORDER — FENTANYL CITRATE (PF) 100 MCG/2ML IJ SOLN
INTRAMUSCULAR | Status: AC
  Filled 2017-08-06: qty 2

## 2017-08-06 MED ORDER — MIDAZOLAM HCL 2 MG/2ML IJ SOLN
INTRAMUSCULAR | Status: DC | PRN
  Administered 2017-08-06 (×2): 1 mg via INTRAVENOUS

## 2017-08-06 MED ORDER — HEPARIN (PORCINE) IN NACL 2-0.9 UNIT/ML-% IJ SOLN
INTRAMUSCULAR | Status: AC
  Filled 2017-08-06: qty 1000

## 2017-08-06 MED ORDER — OXYCODONE-ACETAMINOPHEN 5-325 MG PO TABS
1.0000 | ORAL_TABLET | Freq: Once | ORAL | Status: AC
  Administered 2017-08-06: 1 via ORAL

## 2017-08-06 MED ORDER — CLOPIDOGREL BISULFATE 300 MG PO TABS
600.0000 mg | ORAL_TABLET | Freq: Once | ORAL | Status: AC
  Administered 2017-08-06: 600 mg via ORAL

## 2017-08-06 MED ORDER — SODIUM CHLORIDE 0.9 % IV SOLN: 250.0000 mL | INTRAVENOUS | Status: DC | PRN

## 2017-08-06 MED ORDER — NITROGLYCERIN 1 MG/10 ML FOR IR/CATH LAB
INTRA_ARTERIAL | Status: AC
  Filled 2017-08-06: qty 10

## 2017-08-06 MED ORDER — OXYCODONE-ACETAMINOPHEN 5-325 MG PO TABS
ORAL_TABLET | ORAL | Status: AC
  Administered 2017-08-06: 1 via ORAL
  Filled 2017-08-06: qty 1

## 2017-08-06 MED ORDER — SODIUM CHLORIDE 0.9 % IV SOLN
INTRAVENOUS | Status: DC
  Administered 2017-08-06: 150 mL/h via INTRAVENOUS

## 2017-08-06 MED ORDER — SODIUM CHLORIDE 0.9% FLUSH: 3.0000 mL | INTRAVENOUS | Status: DC | PRN

## 2017-08-06 MED ORDER — IODIXANOL 320 MG/ML IV SOLN
INTRAVENOUS | Status: DC | PRN
  Administered 2017-08-06: 210 mL via INTRAVENOUS

## 2017-08-06 MED ORDER — LIDOCAINE HCL (PF) 1 % IJ SOLN
INTRAMUSCULAR | Status: DC | PRN
  Administered 2017-08-06: 15 mL

## 2017-08-06 MED ORDER — HEPARIN SODIUM (PORCINE) 1000 UNIT/ML IJ SOLN
INTRAMUSCULAR | Status: AC
  Filled 2017-08-06: qty 1

## 2017-08-06 MED ORDER — CLOPIDOGREL BISULFATE 75 MG PO TABS: 75.0000 mg | ORAL_TABLET | Freq: Every day | ORAL | 3 refills | Status: DC

## 2017-08-06 MED ORDER — CLOPIDOGREL BISULFATE 75 MG PO TABS: 75.0000 mg | ORAL_TABLET | Freq: Every day | ORAL | Status: DC

## 2017-08-06 MED ORDER — HYDRALAZINE HCL 20 MG/ML IJ SOLN: 5.0000 mg | INTRAMUSCULAR | Status: DC | PRN

## 2017-08-06 MED ORDER — METFORMIN HCL 1000 MG PO TABS: 500.0000 mg | ORAL_TABLET | Freq: Two times a day (BID) | ORAL | 5 refills | Status: DC

## 2017-08-06 MED ORDER — LABETALOL HCL 5 MG/ML IV SOLN: 10.0000 mg | INTRAVENOUS | Status: DC | PRN

## 2017-08-06 MED ORDER — NITROGLYCERIN 1 MG/10 ML FOR IR/CATH LAB
INTRA_ARTERIAL | Status: DC | PRN
  Administered 2017-08-06: 500 ug via INTRA_ARTERIAL
  Administered 2017-08-06: 300 ug via INTRA_ARTERIAL

## 2017-08-06 MED ORDER — CEFAZOLIN SODIUM-DEXTROSE 2-4 GM/100ML-% IV SOLN
2.0000 g | Freq: Three times a day (TID) | INTRAVENOUS | Status: DC
  Administered 2017-08-06: 2 g via INTRAVENOUS

## 2017-08-06 MED ORDER — SODIUM CHLORIDE 0.9% FLUSH: 3.0000 mL | Freq: Two times a day (BID) | INTRAVENOUS | Status: DC

## 2017-08-06 MED ORDER — HEPARIN (PORCINE) IN NACL 2-0.9 UNIT/ML-% IJ SOLN
INTRAMUSCULAR | Status: AC | PRN
  Administered 2017-08-06: 1000 mL

## 2017-08-06 MED ORDER — CEFAZOLIN SODIUM-DEXTROSE 2-4 GM/100ML-% IV SOLN
INTRAVENOUS | Status: AC
  Administered 2017-08-06: 2 g via INTRAVENOUS
  Filled 2017-08-06: qty 100

## 2017-08-06 MED ORDER — LIDOCAINE HCL 1 % IJ SOLN
INTRAMUSCULAR | Status: AC
  Filled 2017-08-06: qty 20

## 2017-08-06 MED ORDER — HEPARIN SODIUM (PORCINE) 1000 UNIT/ML IJ SOLN
INTRAMUSCULAR | Status: DC | PRN
  Administered 2017-08-06: 4000 [IU] via INTRAVENOUS
  Administered 2017-08-06: 3000 [IU] via INTRAVENOUS
  Administered 2017-08-06: 9000 [IU] via INTRAVENOUS

## 2017-08-06 MED ORDER — FENTANYL CITRATE (PF) 100 MCG/2ML IJ SOLN
INTRAMUSCULAR | Status: DC | PRN
  Administered 2017-08-06: 50 ug via INTRAVENOUS
  Administered 2017-08-06 (×2): 25 ug via INTRAVENOUS

## 2017-08-06 MED ORDER — CLOPIDOGREL BISULFATE 300 MG PO TABS
ORAL_TABLET | ORAL | Status: AC
  Administered 2017-08-06: 600 mg via ORAL
  Filled 2017-08-06: qty 2

## 2017-08-06 SURGICAL SUPPLY — 31 items
BALLN IN.PACT DCB 5X120 (BALLOONS) ×3
CATH CXI 2.3F 150 ST (CATHETERS) ×3 IMPLANT
CATH HAWKONE LS STANDARD TIP (CATHETERS) ×3
CATH HAWKONE LS STD TIP (CATHETERS) ×2 IMPLANT
CATH NAVICROSS ST .035X135CM (MICROCATHETER) ×3 IMPLANT
CATH OMNI FLUSH 5F 65CM (CATHETERS) ×3 IMPLANT
CATH TEMPO AQUA 5F 100CM (CATHETERS) ×3 IMPLANT
DCB IN.PACT 5X120 (BALLOONS) ×2 IMPLANT
DEVICE CLOSURE MYNXGRIP 6/7F (Vascular Products) ×3 IMPLANT
DEVICE TORQUE .014-.018 (MISCELLANEOUS) ×2 IMPLANT
GUIDEWIRE ANGLED .035X150CM (WIRE) ×3 IMPLANT
GUIDEWIRE ANGLED .035X260CM (WIRE) ×3 IMPLANT
GUIDEWIRE ASTATO XS 20G 300CM (WIRE) ×3 IMPLANT
KIT ENCORE 26 ADVANTAGE (KITS) ×3 IMPLANT
KIT MICROPUNCTURE NIT STIFF (SHEATH) ×3 IMPLANT
KIT PV (KITS) ×3 IMPLANT
SHEATH HIGHFLEX ANSEL 7FR 55CM (SHEATH) ×3 IMPLANT
SHEATH PINNACLE 5F 10CM (SHEATH) ×3 IMPLANT
SHEATH PINNACLE 7F 10CM (SHEATH) ×3 IMPLANT
STOPCOCK MORSE 400PSI 3WAY (MISCELLANEOUS) ×3 IMPLANT
SYRINGE MEDRAD AVANTA MACH 7 (SYRINGE) ×3 IMPLANT
TAPE VIPERTRACK RADIOPAQ (MISCELLANEOUS) ×2 IMPLANT
TAPE VIPERTRACK RADIOPAQUE (MISCELLANEOUS) ×1
TORQUE DEVICE .014-.018 (MISCELLANEOUS) ×3
TRANSDUCER W/STOPCOCK (MISCELLANEOUS) ×3 IMPLANT
TRAY PV CATH (CUSTOM PROCEDURE TRAY) ×3 IMPLANT
TUBING CIL FLEX 10 FLL-RA (TUBING) ×3 IMPLANT
WIRE BENTSON .035X145CM (WIRE) ×3 IMPLANT
WIRE HI TORQ COMMND ES.014X300 (WIRE) ×3 IMPLANT
WIRE HI TORQ VERSACORE J 260CM (WIRE) ×3 IMPLANT
WIRE SPARTACORE .014X300CM (WIRE) ×3 IMPLANT

## 2017-08-06 NOTE — Progress Notes (Signed)
Patient has self contained insulin pump that delivers 30 units of insulin over 24 hours set to basal rate.  Patient states he presses the button 3-4 times with meals as needed.  Patient given 600 plavix per Doctor Nadyne Coombes that was ordered by Dr. Virgina Jock.

## 2017-08-06 NOTE — Interval H&P Note (Signed)
History and Physical Interval Note:  08/06/2017 7:25 AM  [name]  has presented today for surgery, with the diagnosis of Claudication  The various methods of treatment have been discussed with the patient and family. After consideration of risks, benefits and other options for treatment, the patient has consented to  Procedure(s): LOWER EXTREMITY ANGIOGRAPHY (N/A) as a surgical intervention .  The patient's history has been reviewed, patient examined, no change in status, stable for surgery.  I have reviewed the patient's chart and labs.  Questions were answered to the patient's satisfaction.     [place]

## 2017-08-06 NOTE — Discharge Instructions (Signed)

## 2017-08-07 MED FILL — Heparin Sodium (Porcine) 2 Unit/ML in Sodium Chloride 0.9%: INTRAMUSCULAR | Qty: 1000 | Status: AC

## 2017-08-07 MED FILL — Nitroglycerin IV Soln 100 MCG/ML in D5W: INTRA_ARTERIAL | Qty: 10 | Status: AC

## 2017-08-07 MED FILL — Lidocaine HCl Local Inj 1%: INTRAMUSCULAR | Qty: 20 | Status: AC

## 2017-08-29 ENCOUNTER — Other Ambulatory Visit: Payer: Self-pay | Admitting: Cardiology

## 2017-08-29 DIAGNOSIS — I872 Venous insufficiency (chronic) (peripheral): Secondary | ICD-10-CM

## 2017-09-26 ENCOUNTER — Ambulatory Visit
Admission: RE | Admit: 2017-09-26 | Discharge: 2017-09-26 | Disposition: A | Discharge status: Home / Self Care | Payer: BC Managed Care – PPO | Source: Ambulatory Visit | Attending: Cardiology | Admitting: Cardiology

## 2017-09-26 DIAGNOSIS — I872 Venous insufficiency (chronic) (peripheral): Secondary | ICD-10-CM
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
## 2017-09-27 ENCOUNTER — Telehealth: Payer: Self-pay | Admitting: Radiology

## 2017-09-27 NOTE — Telephone Encounter (Signed)
Referral faxed to [facility] [phone]).  Ria Comment at [facility] confirmed that faxed referral has been received.  Their office will contact patient to schedule an appointment.  Dajion Bickford Riki Rusk, [place] 09/27/2017 10:19 AM

## 2017-10-11 ENCOUNTER — Encounter (HOSPITAL_BASED_OUTPATIENT_CLINIC_OR_DEPARTMENT_OTHER): Payer: BC Managed Care – PPO | Attending: Internal Medicine

## 2017-10-11 DIAGNOSIS — E1140 Type 2 diabetes mellitus with diabetic neuropathy, unspecified: Secondary | ICD-10-CM | POA: Diagnosis not present

## 2017-10-11 DIAGNOSIS — I10 Essential (primary) hypertension: Secondary | ICD-10-CM | POA: Insufficient documentation

## 2017-10-11 DIAGNOSIS — L97822 Non-pressure chronic ulcer of other part of left lower leg with fat layer exposed: Secondary | ICD-10-CM | POA: Insufficient documentation

## 2017-10-11 DIAGNOSIS — E1151 Type 2 diabetes mellitus with diabetic peripheral angiopathy without gangrene: Secondary | ICD-10-CM | POA: Diagnosis not present

## 2017-10-11 DIAGNOSIS — G4730 Sleep apnea, unspecified: Secondary | ICD-10-CM | POA: Diagnosis not present

## 2017-10-21 DIAGNOSIS — L97822 Non-pressure chronic ulcer of other part of left lower leg with fat layer exposed: Secondary | ICD-10-CM | POA: Diagnosis not present

## 2017-10-28 ENCOUNTER — Encounter (HOSPITAL_BASED_OUTPATIENT_CLINIC_OR_DEPARTMENT_OTHER): Payer: BC Managed Care – PPO | Attending: Internal Medicine

## 2017-10-28 DIAGNOSIS — E11621 Type 2 diabetes mellitus with foot ulcer: Secondary | ICD-10-CM | POA: Diagnosis not present

## 2017-10-28 DIAGNOSIS — E1151 Type 2 diabetes mellitus with diabetic peripheral angiopathy without gangrene: Secondary | ICD-10-CM | POA: Diagnosis not present

## 2017-10-28 DIAGNOSIS — L97822 Non-pressure chronic ulcer of other part of left lower leg with fat layer exposed: Secondary | ICD-10-CM | POA: Insufficient documentation

## 2017-10-28 DIAGNOSIS — I10 Essential (primary) hypertension: Secondary | ICD-10-CM | POA: Insufficient documentation

## 2017-10-28 DIAGNOSIS — E1142 Type 2 diabetes mellitus with diabetic polyneuropathy: Secondary | ICD-10-CM | POA: Diagnosis not present

## 2017-10-28 DIAGNOSIS — L97522 Non-pressure chronic ulcer of other part of left foot with fat layer exposed: Secondary | ICD-10-CM | POA: Diagnosis not present

## 2017-10-28 DIAGNOSIS — E11622 Type 2 diabetes mellitus with other skin ulcer: Secondary | ICD-10-CM | POA: Diagnosis present

## 2017-10-28 DIAGNOSIS — Z794 Long term (current) use of insulin: Secondary | ICD-10-CM | POA: Insufficient documentation

## 2017-10-28 DIAGNOSIS — G4730 Sleep apnea, unspecified: Secondary | ICD-10-CM | POA: Insufficient documentation

## 2017-11-04 DIAGNOSIS — E11622 Type 2 diabetes mellitus with other skin ulcer: Secondary | ICD-10-CM | POA: Diagnosis not present

## 2017-11-11 DIAGNOSIS — E11622 Type 2 diabetes mellitus with other skin ulcer: Secondary | ICD-10-CM | POA: Diagnosis not present

## 2017-11-19 DIAGNOSIS — E11622 Type 2 diabetes mellitus with other skin ulcer: Secondary | ICD-10-CM | POA: Diagnosis not present

## 2017-11-25 ENCOUNTER — Encounter (HOSPITAL_BASED_OUTPATIENT_CLINIC_OR_DEPARTMENT_OTHER): Payer: BC Managed Care – PPO | Attending: Internal Medicine

## 2017-11-25 DIAGNOSIS — L97822 Non-pressure chronic ulcer of other part of left lower leg with fat layer exposed: Secondary | ICD-10-CM | POA: Insufficient documentation

## 2017-11-25 DIAGNOSIS — G4730 Sleep apnea, unspecified: Secondary | ICD-10-CM | POA: Insufficient documentation

## 2017-11-25 DIAGNOSIS — E11622 Type 2 diabetes mellitus with other skin ulcer: Secondary | ICD-10-CM | POA: Diagnosis not present

## 2017-11-25 DIAGNOSIS — E1140 Type 2 diabetes mellitus with diabetic neuropathy, unspecified: Secondary | ICD-10-CM | POA: Diagnosis not present

## 2017-11-25 DIAGNOSIS — I10 Essential (primary) hypertension: Secondary | ICD-10-CM | POA: Diagnosis not present

## 2017-12-02 DIAGNOSIS — E11622 Type 2 diabetes mellitus with other skin ulcer: Secondary | ICD-10-CM | POA: Diagnosis not present

## 2017-12-09 DIAGNOSIS — E11622 Type 2 diabetes mellitus with other skin ulcer: Secondary | ICD-10-CM | POA: Diagnosis not present

## 2017-12-17 DIAGNOSIS — E11622 Type 2 diabetes mellitus with other skin ulcer: Secondary | ICD-10-CM | POA: Diagnosis not present

## 2017-12-23 ENCOUNTER — Encounter (HOSPITAL_BASED_OUTPATIENT_CLINIC_OR_DEPARTMENT_OTHER): Payer: BC Managed Care – PPO | Attending: Internal Medicine

## 2017-12-23 DIAGNOSIS — E1142 Type 2 diabetes mellitus with diabetic polyneuropathy: Secondary | ICD-10-CM | POA: Diagnosis not present

## 2017-12-23 DIAGNOSIS — L97822 Non-pressure chronic ulcer of other part of left lower leg with fat layer exposed: Secondary | ICD-10-CM | POA: Diagnosis not present

## 2017-12-23 DIAGNOSIS — G4730 Sleep apnea, unspecified: Secondary | ICD-10-CM | POA: Insufficient documentation

## 2017-12-23 DIAGNOSIS — I10 Essential (primary) hypertension: Secondary | ICD-10-CM | POA: Diagnosis not present

## 2017-12-23 DIAGNOSIS — E1151 Type 2 diabetes mellitus with diabetic peripheral angiopathy without gangrene: Secondary | ICD-10-CM | POA: Insufficient documentation

## 2017-12-23 DIAGNOSIS — Z794 Long term (current) use of insulin: Secondary | ICD-10-CM | POA: Diagnosis not present

## 2017-12-23 DIAGNOSIS — E11621 Type 2 diabetes mellitus with foot ulcer: Secondary | ICD-10-CM | POA: Diagnosis not present

## 2017-12-24 ENCOUNTER — Encounter (HOSPITAL_BASED_OUTPATIENT_CLINIC_OR_DEPARTMENT_OTHER): Payer: BC Managed Care – PPO

## 2017-12-31 DIAGNOSIS — E11621 Type 2 diabetes mellitus with foot ulcer: Secondary | ICD-10-CM | POA: Diagnosis not present

## 2018-01-07 DIAGNOSIS — E1142 Type 2 diabetes mellitus with diabetic polyneuropathy: Secondary | ICD-10-CM | POA: Insufficient documentation

## 2018-01-07 DIAGNOSIS — I10 Essential (primary) hypertension: Secondary | ICD-10-CM | POA: Insufficient documentation

## 2018-01-07 DIAGNOSIS — E11621 Type 2 diabetes mellitus with foot ulcer: Secondary | ICD-10-CM | POA: Diagnosis not present

## 2018-01-07 DIAGNOSIS — E11622 Type 2 diabetes mellitus with other skin ulcer: Secondary | ICD-10-CM | POA: Insufficient documentation

## 2018-01-07 DIAGNOSIS — L97822 Non-pressure chronic ulcer of other part of left lower leg with fat layer exposed: Secondary | ICD-10-CM | POA: Insufficient documentation

## 2018-01-07 DIAGNOSIS — E1151 Type 2 diabetes mellitus with diabetic peripheral angiopathy without gangrene: Secondary | ICD-10-CM | POA: Insufficient documentation

## 2018-01-07 DIAGNOSIS — G4730 Sleep apnea, unspecified: Secondary | ICD-10-CM | POA: Insufficient documentation

## 2018-04-19 NOTE — H&P (Signed)
OFFICE VISIT NOTES COPIED TO EPIC FOR DOCUMENTATION  . History of Present Illness [name]; 03/05/2018 8:43 AM) Patient words: fu abi; last OV 11/25/17.  The patient is a 67 year old male who presents for a Follow-up for Abnormal EKG.  Additional reasons for visit:  Follow-up for Peripheral vascular disease is described as the following: 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 He has h/o angioplasty to his chronically occluded left distal SFA and popliteal artery with directional atherectomy followed by [facility] angioplasty on 08/06/2017 following which his left leg ulcer has essentially healed.  Denies any symptoms of claudication or pain otherwise. His left leg ulcer has healed about 90% in size with small ulceration still present and he has stopped going to the wound care as there is no improvemen. He still has mild symptoms of claudication involving the left And states that he is concerned and depressed about persistent dime-sized ulceration which continues to bleed and has mild ooze.   Problem List/Past Medical [name], MD; 03/04/2018 4:05 PM) Arthritis (M19.90)  Sleep apnea (G47.30)  On CPAP and follows [facility] Benign essential hypertension (I10)  EKG 11/25/2017: Normal sinus rhythm at 67 bpm, normal axis, no evidence of ischemia. Normal EKG. Abnormal EKG (R94.31)  EKG 04/24/2017: Sinus bradycardia at rate of 51 bpm, normal axis. No evidence of ischemia. Low voltage compresses. Compared to 04/11/2017, occasional PACs and baseline wander no longer present. Laboratory examination (Z01.89)  01/30/2018: Hemoglobin 13.5, hematocrit 41.7, CBC otherwise normal.  Potassium 3.7, creatinine 1.2, EGFR 62/72, CMP otherwise normal. Hemoglobin A1c 7.5%. Cholesterol 113, triglycerides 1:15, HDL 33, LDL 57. 09/25/2017: RBC 4.86, hemoglobin 12.7, hematocrit 39.7, MCV 81.7, platelets 243, CBC stable. Potassium 4.2, creatinine 1.6, CMP normal. CRP 0.78. 08/01/2017: Glucose 178, creatinine 1.3, EGFR 56/65, potassium 4.1, BMP normal. CBC normal. INR 1.0, prothrombin time 10.4. HB 12.9/HCT 41.2, platelets 198. Potassium 4.4, BUN 14, creatinine 1.4, eGFR 51/61 mL. CMP otherwise normal. Cholesterol 112, triglycerides 86, HDL 36, LDL 59. Uncontrolled diabetes mellitus with stage 3 chronic kidney disease (E11.22, E11.65)  Dyspnea on exertion (R06.09)  Echocardiogram 05/23/2017: Left ventricle cavity is normal in size. Moderate concentric hypertrophy of the left ventricle. Normal global wall motion. Doppler evidence of grade I (impaired) diastolic dysfunction, normal LAP. Calculated EF 55%. No significant valvular abnormalities. No evidence of pulmonary hypertension. Asymptomatic stenosis of left carotid artery (I65.22)  Carotid artery duplex 11/21/2017: Stenosis in the left internal carotid artery (50-69%). Antegrade right vertebral artery flow. Antegrade left vertebral artery flow. Follow up in six months is appropriate if clinically indicated. Compared to the study done on 05/23/2017, no significant change. Minimal stenosis noted on right no longer present. Peripheral vascular disease of lower extremity with ulceration (I73.9)  Peripheral arteriogram 08/06/2017: PTA and directional atherectomy of the left distal SFA and popliteal artery with HawkOne followed by 5.0x120 mm InPact Admiral DCB, CTO, 100% reduced to 0% with brisk flow. Left AT and peroneal runoff. PT occluded. Hyperlipidemia, mixed (E78.2)  Bilateral leg edema (R60.0)  Dr. Anselm Pancoast (Radiology) 09/26/2017: Duplex ultrasound & clinical evaluation is negative for evidence of clinically significant superficial venous  insufficiency.  Allergies [name]; 03/04/2018 3:01 PM) No Known Drug Allergies [04/24/2017]:  Family History [name]; 03/04/2018 3:01  PM) Mother  Deceased. diabetic complications, stroke, narcolepsy, htn, diet at age 66 years Father  Deceased. Died at age 8 years - dementia, heart attack at age 70 years Sister 1  younger by 3 years, healthy. One sister passed at age 20 years from over medication Brother 2  2 brothers younger by 4 years died at cardiac arrest at late 71 years and another brother younger by 4 years Family history unknown - Adopted   Social History [name]; 03/04/2018 3:01 PM) Current tobacco use  Former smoker. quit 1999 1 ppd Alcohol Use  Occasional alcohol use, Drinks beer, Drinks hard liquor. Marital status  Married. Living Situation  Lives with spouse. Number of Children  2.  Past Surgical History [name]; 03/04/2018 3:01 PM) Tonsillectomy  at age 67 Nasal Sinusotomy [2000]:  Medication History [name]; 03/04/2018 3:13 PM) Aspirin (81MG Tablet Chewable, 1 (one) Tablet Tablet Tablet T Oral daily, Taken starting 04/24/2017) Active. Metanx (1 Oral two times daily) Specific strength unknown - Active. MetFORMIN HCl (1000MG Tablet, 1 Oral two times daily) Active. Pravastatin Sodium (40MG Tablet, 1 Oral daily) Active. Carvedilol (12.5MG Tablet, 1 Oral daily) Active. Losartan Potassium (50MG Tablet, 1 Oral daily) Active. Dorzolamide HCl-Timolol Mal (22.3-6.8MG/ML Solution, Ophthalmic two times daily) Active. Travatan Z (0.004% Solution, Ophthalmic daily) Active. NovoLOG (100UNIT/ML Solution, pump Subcutaneous daily) Active. Victoza (18MG/3ML Soln Pen-inj, 1.54m Subcutaneous daily) Active. Clopidogrel Bisulfate (75MG Tablet, 1 Oral daily) Active. Triamterene-HCTZ (37.5-25MG Capsule, 1 Oral daily) Active. Medications Reconciled (verbally with pt) Lyrica (50MG Capsule, 1 Oral daily) Active.  Diagnostic  Studies History ([name]; 03/05/2018 8:47 AM) Carotid Doppler [05/23/2017]: Minimal stenosis in the right internal carotid artery (minimal). Stenosis in the left internal carotid artery (50-69%), lower end of spectrum. Antegrade right vertebral artery flow. Antegrade left vertebral artery flow. Follow up in six months is appropriate if clinically indicated. Nuclear stress test [05/13/2017]: 1. The resting electrocardiogram demonstrated normal sinus rhythm, normal resting conduction and no resting arrhythmias. Stress EKG is non-diagnostic for ischemia as it a pharmacologic stress using Lexiscan. Stress symptoms included dyspnea, nausea, dizziness. 2. Myocardial perfusion imaging is normal. Overall left ventricular systolic function was normal without regional wall motion abnormalities. The left ventricular ejection fraction was normal visually but mildly depressed at 45%. This is a low risk study. Echocardiogram [05/23/2017]: Left ventricle cavity is normal in size. Moderate concentric hypertrophy of the left ventricle. Normal global wall motion. Doppler evidence of grade I (impaired) diastolic dysfunction, normal LAP. Calculated EF 55%. No significant valvular abnormalities. No evidence of pulmonary hypertension. ABI  02/26/2018 This exam reveals normal perfusion of the right lower extremity (ABI 0.98). This exam reveals severely decreased perfusion of the left lower extremity, noted at the anterior tibial artery level (ABI 0.49). Mildly abnormal (biphasic) waveforms of the right ankle. Severely abnormal (monophasic) waveforms of the left ankle. No significant change since 11/21/2017.Consider further work up if clinically indicated.    Review of Systems (Laverda PageMD; 03/05/2018 8:44 AM) General Not Present- Appetite Loss and Weight Gain. Respiratory Not Present- Chronic Cough and Wakes up from Sleep Wheezing or Short of Breath. Cardiovascular Present- Claudications,  Difficulty Breathing On Exertion (chronic) and Edema (wears support stockings). Not Present- Chest Pain and Difficulty Breathing Lying Down. Gastrointestinal Not Present- Black, Tarry Stool and Difficulty Swallowing. Musculoskeletal Not Present- Decreased Range of Motion, Joint Pain and Muscle Atrophy. Neurological Not Present- Attention Deficit. Psychiatric Not Present- Personality Changes and Suicidal Ideation. Endocrine Not Present- Cold Intolerance and  Heat Intolerance. Hematology Not Present- Abnormal Bleeding. All other systems negative  Vitals [name]; 03/04/2018 3:18 PM) 03/04/2018 3:10 PM Weight: 264.56 lb Height: 74in Body Surface Area: 2.45 m Body Mass Index: 33.97 kg/m  Pulse: 70 (Regular)  P.OX: 98% (Room air) BP: 132/75 (Sitting, Left Arm, Standard)       Physical Exam [name]; 03/05/2018 8:44 AM) General Mental Status-Alert. General Appearance-Cooperative and Appears stated age. Build & Nutrition-Well built and Moderately obese.  Head and Neck Thyroid Gland Characteristics - normal size and consistency and no palpable nodules.  Chest and Lung Exam Chest and lung exam reveals -quiet, even and easy respiratory effort with no use of accessory muscles, non-tender and on auscultation, normal breath sounds, no adventitious sounds.  Cardiovascular Cardiovascular examination reveals -normal heart sounds, regular rate and rhythm with no murmurs, carotid auscultation reveals no bruits and abdominal aorta auscultation reveals no bruits and no prominent pulsation.  Abdomen Inspection Contour - Obese. Palpation/Percussion Normal exam - Non Tender and No hepatosplenomegaly.  Peripheral Vascular Lower Extremity Inspection - Left - Ischemic(superficial wound chronic has essentially healed but has one penny sized ulceration on the lateral aspect of the left leg not healing.) and Pigmented. Bilateral - Rapid capillary refill.  Palpation - Edema - Left - 2+ Pitting edema. Right - Trace edema. Femoral pulse - Bilateral - 2+. Popliteal pulse - Bilateral - 1+. Dorsalis pedis pulse - Bilateral - Absent. Posterior tibial pulse - Bilateral - Absent. Carotid arteries - Right-Soft Bruit. Carotid arteries - Bilateral-No Carotid bruit.  Neurologic Neurologic evaluation reveals -alert and oriented x 3 with no impairment of recent or remote memory. Motor-Grossly intact without any focal deficits.  Musculoskeletal Global Assessment Left Lower Extremity - no deformities, masses or tenderness, no known fractures. Right Lower Extremity - Edema present(tenderness the the metacarpo-phalageal joint.).  Assessment & Plan [name]; 03/05/2018 8:48 AM) Peripheral vascular disease of lower extremity with ulceration (I73.9) Story: Peripheral arteriogram 08/06/2017: PTA and directional atherectomy of the left distal SFA and popliteal artery with HawkOne followed by 5.0x120 mm InPact Admiral DCB, CTO, 100% reduced to 0% with brisk flow. Left AT and peroneal runoff. PT occluded. Impression: ABI 02/26/2018 This exam reveals normal perfusion of the right lower extremity (ABI 0.98). This exam reveals severely decreased perfusion of the left lower extremity, noted at the anterior tibial artery level (ABI 0.49). Mildly abnormal (biphasic) waveforms of the right ankle. Severely abnormal (monophasic) waveforms of the left ankle. No significant change since 11/21/2017. Consider further work up if clinically indicated. Benign essential hypertension (I10) Story: EKG 11/25/2017: Normal sinus rhythm at 67 bpm, normal axis, no evidence of ischemia. Normal EKG. Hyperlipidemia, mixed (E78.2) Bilateral leg edema (R60.0) Story: Dr. Anselm Pancoast (Radiology) 09/26/2017: Duplex ultrasound & clinical evaluation is negative for evidence of clinically significant superficial venous insufficiency.  Laboratory examination (Z01.89) Story: 04/14/2018:  Glucose 59, creatinine 1.62, EGFR 43/50, potassium 4.2, BMP otherwise normal.  CBC normal.  01/30/2018: Hemoglobin 13.5, hematocrit 41.7, CBC otherwise normal. Potassium 3.7, creatinine 1.2, EGFR 62/72, CMP otherwise normal. Hemoglobin A1c 7.5%. Cholesterol 113, triglycerides 1:15, HDL 33, LDL 57.  09/25/2017: RBC 4.86, hemoglobin 12.7, hematocrit 39.7, MCV 81.7, platelets 243, CBC stable. Potassium 4.2, creatinine 1.6, CMP normal. CRP 0.78.  08/01/2017: Glucose 178, creatinine 1.3, EGFR 56/65, potassium 4.1, BMP normal. CBC normal. INR 1.0, prothrombin time 10.4.  HB 12.9/HCT 41.2, platelets 198. Potassium 4.4, BUN 14, creatinine 1.4, eGFR 51/61 mL. CMP otherwise normal. Cholesterol 112, triglycerides 86, HDL  36, LDL 59.  Note:. Recommendations:  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 He has h/o angioplasty to his chronically occluded left distal SFA and popliteal artery with directional atherectomy followed by [facility] angioplasty on 08/06/2017 following which his left leg ulcer has essentially healed.   Denies any symptoms of claudication or pain otherwise. His left leg ulcer has healed about 90% in size with small ulceration still present and he has stopped going to the wound care as there is no improvement. He is still on dual antiplatelet therapy. For chronic leg edema (negative venous insufficiency study),he is wearing support stockings regularly.  I discussed the findings of the Lower extremity arterial duplex and he still has persistant wound and mild claudication of left calf. I have recommended that we proceed with repeat angiography as his risk of further skin breakdown again discussed infection is fairly  high. His blood pressure is now well controlled, lipids are under excellent control except for obesity. Weight loss and diet again discussed with the patient.  CC: Dr. [name]  Signed by [name], MD (03/05/2018 8:49 AM)

## 2018-04-22 ENCOUNTER — Ambulatory Visit (HOSPITAL_COMMUNITY)
Admission: RE | Disposition: A | Discharge status: Home / Self Care | Payer: Self-pay | Source: Ambulatory Visit | Attending: Cardiology

## 2018-04-22 ENCOUNTER — Other Ambulatory Visit: Payer: Self-pay

## 2018-04-22 ENCOUNTER — Ambulatory Visit (HOSPITAL_COMMUNITY)
Admission: RE | Admit: 2018-04-22 | Discharge: 2018-04-22 | Disposition: A | Discharge status: Home / Self Care | Payer: BC Managed Care – PPO | Source: Ambulatory Visit | Attending: Cardiology | Admitting: Cardiology

## 2018-04-22 DIAGNOSIS — Z8249 Family history of ischemic heart disease and other diseases of the circulatory system: Secondary | ICD-10-CM | POA: Insufficient documentation

## 2018-04-22 DIAGNOSIS — I129 Hypertensive chronic kidney disease with stage 1 through stage 4 chronic kidney disease, or unspecified chronic kidney disease: Secondary | ICD-10-CM | POA: Insufficient documentation

## 2018-04-22 DIAGNOSIS — Z79899 Other long term (current) drug therapy: Secondary | ICD-10-CM | POA: Diagnosis not present

## 2018-04-22 DIAGNOSIS — I6522 Occlusion and stenosis of left carotid artery: Secondary | ICD-10-CM | POA: Insufficient documentation

## 2018-04-22 DIAGNOSIS — E782 Mixed hyperlipidemia: Secondary | ICD-10-CM | POA: Insufficient documentation

## 2018-04-22 DIAGNOSIS — Z9889 Other specified postprocedural states: Secondary | ICD-10-CM | POA: Diagnosis not present

## 2018-04-22 DIAGNOSIS — I70248 Atherosclerosis of native arteries of left leg with ulceration of other part of lower left leg: Secondary | ICD-10-CM | POA: Diagnosis not present

## 2018-04-22 DIAGNOSIS — E11621 Type 2 diabetes mellitus with foot ulcer: Secondary | ICD-10-CM | POA: Diagnosis not present

## 2018-04-22 DIAGNOSIS — Z823 Family history of stroke: Secondary | ICD-10-CM | POA: Diagnosis not present

## 2018-04-22 DIAGNOSIS — N183 Chronic kidney disease, stage 3 (moderate): Secondary | ICD-10-CM | POA: Insufficient documentation

## 2018-04-22 DIAGNOSIS — E1122 Type 2 diabetes mellitus with diabetic chronic kidney disease: Secondary | ICD-10-CM | POA: Diagnosis not present

## 2018-04-22 DIAGNOSIS — Z794 Long term (current) use of insulin: Secondary | ICD-10-CM | POA: Insufficient documentation

## 2018-04-22 DIAGNOSIS — I739 Peripheral vascular disease, unspecified: Secondary | ICD-10-CM | POA: Diagnosis present

## 2018-04-22 DIAGNOSIS — L97921 Non-pressure chronic ulcer of unspecified part of left lower leg limited to breakdown of skin: Secondary | ICD-10-CM | POA: Diagnosis not present

## 2018-04-22 DIAGNOSIS — R0609 Other forms of dyspnea: Secondary | ICD-10-CM | POA: Insufficient documentation

## 2018-04-22 DIAGNOSIS — Z87891 Personal history of nicotine dependence: Secondary | ICD-10-CM | POA: Diagnosis not present

## 2018-04-22 DIAGNOSIS — G4730 Sleep apnea, unspecified: Secondary | ICD-10-CM | POA: Diagnosis not present

## 2018-04-22 DIAGNOSIS — M1990 Unspecified osteoarthritis, unspecified site: Secondary | ICD-10-CM | POA: Diagnosis not present

## 2018-04-22 DIAGNOSIS — Z833 Family history of diabetes mellitus: Secondary | ICD-10-CM | POA: Diagnosis not present

## 2018-04-22 DIAGNOSIS — Z7982 Long term (current) use of aspirin: Secondary | ICD-10-CM | POA: Insufficient documentation

## 2018-04-22 DIAGNOSIS — R600 Localized edema: Secondary | ICD-10-CM | POA: Insufficient documentation

## 2018-04-22 HISTORY — PX: LOWER EXTREMITY ANGIOGRAPHY: CATH118251

## 2018-04-22 HISTORY — PX: PERIPHERAL VASCULAR INTERVENTION: CATH118257

## 2018-04-22 LAB — POCT ACTIVATED CLOTTING TIME
Activated Clotting Time: 235 seconds
Activated Clotting Time: 246 seconds
Activated Clotting Time: 257 seconds

## 2018-04-22 LAB — GLUCOSE, CAPILLARY
Glucose-Capillary: 104 mg/dL — ABNORMAL HIGH (ref 70–99)
Glucose-Capillary: 108 mg/dL — ABNORMAL HIGH (ref 70–99)

## 2018-04-22 SURGERY — LOWER EXTREMITY ANGIOGRAPHY
Anesthesia: LOCAL

## 2018-04-22 MED ORDER — LIDOCAINE HCL (PF) 1 % IJ SOLN
INTRAMUSCULAR | Status: DC | PRN
  Administered 2018-04-22: 20 mL

## 2018-04-22 MED ORDER — SODIUM CHLORIDE 0.9 % IV BOLUS
500.0000 mL | Freq: Once | INTRAVENOUS | Status: AC
  Administered 2018-04-22: 500 mL via INTRAVENOUS

## 2018-04-22 MED ORDER — CLOPIDOGREL BISULFATE 300 MG PO TABS
ORAL_TABLET | ORAL | Status: AC
  Filled 2018-04-22: qty 1

## 2018-04-22 MED ORDER — SODIUM CHLORIDE 0.9 % IV SOLN: 250.0000 mL | INTRAVENOUS | Status: DC | PRN

## 2018-04-22 MED ORDER — ACETAMINOPHEN 325 MG PO TABS: 650.0000 mg | ORAL_TABLET | ORAL | Status: DC | PRN

## 2018-04-22 MED ORDER — SODIUM CHLORIDE 0.9% FLUSH: 3.0000 mL | Freq: Two times a day (BID) | INTRAVENOUS | Status: DC

## 2018-04-22 MED ORDER — MIDAZOLAM HCL 2 MG/2ML IJ SOLN
INTRAMUSCULAR | Status: AC
  Filled 2018-04-22: qty 2

## 2018-04-22 MED ORDER — IODIXANOL 320 MG/ML IV SOLN
INTRAVENOUS | Status: DC | PRN
  Administered 2018-04-22: 135 mL via INTRAVENOUS

## 2018-04-22 MED ORDER — FENTANYL CITRATE (PF) 100 MCG/2ML IJ SOLN
INTRAMUSCULAR | Status: AC
  Filled 2018-04-22: qty 2

## 2018-04-22 MED ORDER — SODIUM CHLORIDE 0.9% FLUSH: 3.0000 mL | INTRAVENOUS | Status: DC | PRN

## 2018-04-22 MED ORDER — METFORMIN HCL 1000 MG PO TABS: 1000.0000 mg | ORAL_TABLET | Freq: Two times a day (BID) | ORAL | 5 refills | Status: DC

## 2018-04-22 MED ORDER — HEPARIN SODIUM (PORCINE) 1000 UNIT/ML IJ SOLN
INTRAMUSCULAR | Status: DC | PRN
  Administered 2018-04-22 (×2): 3000 [IU] via INTRAVENOUS
  Administered 2018-04-22: 9000 [IU] via INTRAVENOUS

## 2018-04-22 MED ORDER — CLOPIDOGREL BISULFATE 300 MG PO TABS
ORAL_TABLET | ORAL | Status: DC | PRN
  Administered 2018-04-22: 300 mg via ORAL

## 2018-04-22 MED ORDER — HEPARIN (PORCINE) IN NACL 1000-0.9 UT/500ML-% IV SOLN
INTRAVENOUS | Status: AC
  Filled 2018-04-22: qty 500

## 2018-04-22 MED ORDER — LIDOCAINE HCL (PF) 1 % IJ SOLN
INTRAMUSCULAR | Status: AC
  Filled 2018-04-22: qty 30

## 2018-04-22 MED ORDER — MIDAZOLAM HCL 2 MG/2ML IJ SOLN
INTRAMUSCULAR | Status: DC | PRN
  Administered 2018-04-22: 1 mg via INTRAVENOUS

## 2018-04-22 MED ORDER — SODIUM CHLORIDE 0.9 % IV SOLN: INTRAVENOUS | Status: DC

## 2018-04-22 MED ORDER — HYDRALAZINE HCL 20 MG/ML IJ SOLN: 5.0000 mg | INTRAMUSCULAR | Status: DC | PRN

## 2018-04-22 MED ORDER — NITROGLYCERIN 1 MG/10 ML FOR IR/CATH LAB
INTRA_ARTERIAL | Status: AC
  Filled 2018-04-22: qty 10

## 2018-04-22 MED ORDER — HEPARIN (PORCINE) IN NACL 1000-0.9 UT/500ML-% IV SOLN
INTRAVENOUS | Status: AC
  Filled 2018-04-22: qty 1000

## 2018-04-22 MED ORDER — HEPARIN SODIUM (PORCINE) 1000 UNIT/ML IJ SOLN
INTRAMUSCULAR | Status: AC
  Filled 2018-04-22: qty 1

## 2018-04-22 MED ORDER — RIVAROXABAN 2.5 MG PO TABS: 2.5000 mg | ORAL_TABLET | Freq: Two times a day (BID) | ORAL | 6 refills | Status: DC

## 2018-04-22 MED ORDER — ONDANSETRON HCL 4 MG/2ML IJ SOLN: 4.0000 mg | Freq: Four times a day (QID) | INTRAMUSCULAR | Status: DC | PRN

## 2018-04-22 MED ORDER — SODIUM CHLORIDE 0.9 % WEIGHT BASED INFUSION: 1.0000 mL/kg/h | INTRAVENOUS | Status: DC

## 2018-04-22 MED ORDER — HEPARIN (PORCINE) IN NACL 1000-0.9 UT/500ML-% IV SOLN
INTRAVENOUS | Status: DC | PRN
  Administered 2018-04-22 (×3): 500 mL

## 2018-04-22 MED ORDER — FENTANYL CITRATE (PF) 100 MCG/2ML IJ SOLN
INTRAMUSCULAR | Status: DC | PRN
  Administered 2018-04-22 (×2): 50 ug via INTRAVENOUS

## 2018-04-22 SURGICAL SUPPLY — 30 items
BALLN STERLING OTW 5X100X135 (BALLOONS) ×3
BALLOON STERLING OTW 5X100X135 (BALLOONS) ×2 IMPLANT
CATH ANGIO 5F PIGTAIL 65CM (CATHETERS) ×3 IMPLANT
CATH CROSS OVER TEMPO 5F (CATHETERS) ×3 IMPLANT
CATH HAWKONE LS STANDARD TIP (CATHETERS) ×3
CATH HAWKONE LS STD TIP (CATHETERS) ×2 IMPLANT
CATH NAVICROSS ANGLED 135CM (MICROCATHETER) ×3 IMPLANT
CATH SOFT-VU ST 4F 90CM (CATHETERS) ×3 IMPLANT
CATH STRAIGHT 5FR 65CM (CATHETERS) ×3 IMPLANT
CLOSURE MYNX CONTROL 6F/7F (Vascular Products) ×3 IMPLANT
DEVICE SPIDERFX EMB PROT 5MM (WIRE) ×3 IMPLANT
GUIDEWIRE ASTATO XS 20G 300CM (WIRE) ×3 IMPLANT
KIT ENCORE 26 ADVANTAGE (KITS) ×3 IMPLANT
KIT MICROPUNCTURE NIT STIFF (SHEATH) ×3 IMPLANT
KIT PV (KITS) ×3 IMPLANT
SHEATH HIGHFLEX ANSEL 7FR 55CM (SHEATH) ×3 IMPLANT
SHEATH PINNACLE 5F 10CM (SHEATH) ×3 IMPLANT
SHEATH PINNACLE 7F 10CM (SHEATH) ×3 IMPLANT
SHEATH PROBE COVER 6X72 (BAG) ×3 IMPLANT
STENT VIABAHN 6X150X120 (Permanent Stent) ×3 IMPLANT
STOPCOCK MORSE 400PSI 3WAY (MISCELLANEOUS) ×3 IMPLANT
SYRINGE MEDRAD AVANTA MACH 7 (SYRINGE) ×3 IMPLANT
TAPE VIPERTRACK RADIOPAQ (MISCELLANEOUS) ×2 IMPLANT
TAPE VIPERTRACK RADIOPAQUE (MISCELLANEOUS) ×1
TRANSDUCER W/STOPCOCK (MISCELLANEOUS) ×3 IMPLANT
TRAY PV CATH (CUSTOM PROCEDURE TRAY) ×3 IMPLANT
TUBING CIL FLEX 10 FLL-RA (TUBING) ×3 IMPLANT
WIRE AMPLATZ SS-J .035X180CM (WIRE) ×3 IMPLANT
WIRE HITORQ VERSACORE ST 145CM (WIRE) ×3 IMPLANT
WIRE VERSACORE LOC 115CM (WIRE) ×3 IMPLANT

## 2018-04-22 NOTE — Progress Notes (Signed)
Up and walked and tolerated well; right groin stable, no bleeding or hematoma 

## 2018-04-22 NOTE — Interval H&P Note (Signed)
History and Physical Interval Note:  04/22/2018 7:33 AM  [name]  has presented today for surgery, with the diagnosis of pvd  The various methods of treatment have been discussed with the patient and family. After consideration of risks, benefits and other options for treatment, the patient has consented to  Procedure(s): LOWER EXTREMITY ANGIOGRAPHY (N/A) as a surgical intervention .  The patient's history has been reviewed, patient examined, no change in status, stable for surgery.  I have reviewed the patient's chart and labs.  Questions were answered to the patient's satisfaction.     [place]

## 2018-04-22 NOTE — Discharge Instructions (Signed)
Femoral Site Care Refer to this sheet in the next few weeks. These instructions provide you with information about caring for yourself after your procedure. Your health care provider may also give you more specific instructions. Your treatment has been planned according to current medical practices, but problems sometimes occur. Call your health care provider if you have any problems or questions after your procedure. What can I expect after the procedure? After your procedure, it is typical to have the following: Bruising at the site that usually fades within 1-2 weeks. Blood collecting in the tissue (hematoma) that may be painful to the touch. It should usually decrease in size and tenderness within 1-2 weeks.  Follow these instructions at home: Take medicines only as directed by your health care provider. You may shower 24-48 hours after the procedure or as directed by your health care provider. Remove the bandage (dressing) and gently wash the site with plain soap and water. Pat the area dry with a clean towel. Do not rub the site, because this may cause bleeding. Do not take baths, swim, or use a hot tub until your health care provider approves. Check your insertion site every day for redness, swelling, or drainage. Do not apply powder or lotion to the site. Limit use of stairs to twice a day for the first 2-3 days or as directed by your health care provider. Do not squat for the first 2-3 days or as directed by your health care provider. Do not lift over 10 lb (4.5 kg) for 5 days after your procedure or as directed by your health care provider. Ask your health care provider when it is okay to: Return to work or school. Resume usual physical activities or sports. Resume sexual activity. Do not drive home if you are discharged the same day as the procedure. Have someone else drive you. You may drive 24 hours after the procedure unless otherwise instructed by your health care provider. Do  not operate machinery or power tools for 24 hours after the procedure or as directed by your health care provider. If your procedure was done as an outpatient procedure, which means that you went home the same day as your procedure, a responsible adult should be with you for the first 24 hours after you arrive home. Keep all follow-up visits as directed by your health care provider. This is important. Contact a health care provider if: You have a fever. You have chills. You have increased bleeding from the site. Hold pressure on the site. Get help right away if: You have unusual pain at the site. You have redness, warmth, or swelling at the site. You have drainage (other than a small amount of blood on the dressing) from the site. The site is bleeding, and the bleeding does not stop after 30 minutes of holding steady pressure on the site. Your leg or foot becomes pale, cool, tingly, or numb. This information is not intended to replace advice given to you by your health care provider. Make sure you discuss any questions you have with your health care provider. Document Released: 02/12/2014 Document Revised: 11/17/2015 Document Reviewed: 12/29/2013 Elsevier Interactive Patient Education  2018 [place].  Moderate Conscious Sedation, Adult, Care After These instructions provide you with information about caring for yourself after your procedure. Your health care provider may also give you more specific instructions. Your treatment has been planned according to current medical practices, but problems sometimes occur. Call your health care provider if you have any problems  or questions after your procedure. What can I expect after the procedure? After your procedure, it is common:  To feel sleepy for several hours.  To feel clumsy and have poor balance for several hours.  To have poor judgment for several hours.  To vomit if you eat too soon.  Follow these instructions at home: For at  least 24 hours after the procedure:   Do not: ? Participate in activities where you could fall or become injured. ? Drive. ? Use heavy machinery. ? Drink alcohol. ? Take sleeping pills or medicines that cause drowsiness. ? Make important decisions or sign legal documents. ? Take care of children on your own.  Rest. Eating and drinking  Follow the diet recommended by your health care provider.  If you vomit: ? Drink water, juice, or soup when you can drink without vomiting. ? Make sure you have little or no nausea before eating solid foods. General instructions  Have a responsible adult stay with you until you are awake and alert.  Take over-the-counter and prescription medicines only as told by your health care provider.  If you smoke, do not smoke without supervision.  Keep all follow-up visits as told by your health care provider. This is important. Contact a health care provider if:  You keep feeling nauseous or you keep vomiting.  You feel light-headed.  You develop a rash.  You have a fever. Get help right away if:  You have trouble breathing. This information is not intended to replace advice given to you by your health care provider. Make sure you discuss any questions you have with your health care provider. Document Released: 04/01/2013 Document Revised: 11/14/2015 Document Reviewed: 10/01/2015 Elsevier Interactive Patient Education  2018 [place] FOR A FULL 48 HOURS AFTER DISCHARGE.   DRINK PLENTY OF FLUIDS FOR THE NEXT 2-3 DAYS TO KEEP HYDRATED.

## 2018-04-23 ENCOUNTER — Encounter (HOSPITAL_COMMUNITY): Payer: Self-pay | Admitting: Cardiology

## 2018-04-23 MED FILL — Nitroglycerin IV Soln 100 MCG/ML in D5W: INTRA_ARTERIAL | Qty: 10 | Status: AC

## 2018-04-28 ENCOUNTER — Inpatient Hospital Stay (HOSPITAL_COMMUNITY)
Admission: AD | Admit: 2018-04-28 | Discharge: 2018-05-09 | DRG: 271 | Disposition: A | Discharge status: Skilled Nursing Facility | Payer: BC Managed Care – PPO | Attending: Cardiology | Admitting: Cardiology

## 2018-04-28 ENCOUNTER — Other Ambulatory Visit: Payer: Self-pay

## 2018-04-28 ENCOUNTER — Encounter (HOSPITAL_COMMUNITY): Payer: Self-pay

## 2018-04-28 DIAGNOSIS — I129 Hypertensive chronic kidney disease with stage 1 through stage 4 chronic kidney disease, or unspecified chronic kidney disease: Secondary | ICD-10-CM | POA: Diagnosis present

## 2018-04-28 DIAGNOSIS — Z9889 Other specified postprocedural states: Secondary | ICD-10-CM

## 2018-04-28 DIAGNOSIS — E1122 Type 2 diabetes mellitus with diabetic chronic kidney disease: Secondary | ICD-10-CM | POA: Diagnosis present

## 2018-04-28 DIAGNOSIS — E785 Hyperlipidemia, unspecified: Secondary | ICD-10-CM | POA: Diagnosis present

## 2018-04-28 DIAGNOSIS — K5900 Constipation, unspecified: Secondary | ICD-10-CM | POA: Diagnosis not present

## 2018-04-28 DIAGNOSIS — Z8611 Personal history of tuberculosis: Secondary | ICD-10-CM | POA: Diagnosis not present

## 2018-04-28 DIAGNOSIS — E1142 Type 2 diabetes mellitus with diabetic polyneuropathy: Secondary | ICD-10-CM | POA: Diagnosis present

## 2018-04-28 DIAGNOSIS — Y838 Other surgical procedures as the cause of abnormal reaction of the patient, or of later complication, without mention of misadventure at the time of the procedure: Secondary | ICD-10-CM | POA: Diagnosis present

## 2018-04-28 DIAGNOSIS — E1169 Type 2 diabetes mellitus with other specified complication: Secondary | ICD-10-CM | POA: Diagnosis not present

## 2018-04-28 DIAGNOSIS — Z95828 Presence of other vascular implants and grafts: Secondary | ICD-10-CM | POA: Diagnosis not present

## 2018-04-28 DIAGNOSIS — R509 Fever, unspecified: Secondary | ICD-10-CM | POA: Diagnosis not present

## 2018-04-28 DIAGNOSIS — D72829 Elevated white blood cell count, unspecified: Secondary | ICD-10-CM

## 2018-04-28 DIAGNOSIS — I70229 Atherosclerosis of native arteries of extremities with rest pain, unspecified extremity: Secondary | ICD-10-CM | POA: Diagnosis present

## 2018-04-28 DIAGNOSIS — I998 Other disorder of circulatory system: Secondary | ICD-10-CM

## 2018-04-28 DIAGNOSIS — Z9641 Presence of insulin pump (external) (internal): Secondary | ICD-10-CM | POA: Diagnosis present

## 2018-04-28 DIAGNOSIS — Z7901 Long term (current) use of anticoagulants: Secondary | ICD-10-CM | POA: Diagnosis not present

## 2018-04-28 DIAGNOSIS — E11319 Type 2 diabetes mellitus with unspecified diabetic retinopathy without macular edema: Secondary | ICD-10-CM | POA: Diagnosis present

## 2018-04-28 DIAGNOSIS — H918X9 Other specified hearing loss, unspecified ear: Secondary | ICD-10-CM | POA: Diagnosis present

## 2018-04-28 DIAGNOSIS — I10 Essential (primary) hypertension: Secondary | ICD-10-CM | POA: Diagnosis not present

## 2018-04-28 DIAGNOSIS — Z79899 Other long term (current) drug therapy: Secondary | ICD-10-CM | POA: Diagnosis not present

## 2018-04-28 DIAGNOSIS — Z794 Long term (current) use of insulin: Secondary | ICD-10-CM

## 2018-04-28 DIAGNOSIS — Z7902 Long term (current) use of antithrombotics/antiplatelets: Secondary | ICD-10-CM

## 2018-04-28 DIAGNOSIS — Z959 Presence of cardiac and vascular implant and graft, unspecified: Secondary | ICD-10-CM

## 2018-04-28 DIAGNOSIS — Z974 Presence of external hearing-aid: Secondary | ICD-10-CM

## 2018-04-28 DIAGNOSIS — E1165 Type 2 diabetes mellitus with hyperglycemia: Secondary | ICD-10-CM | POA: Diagnosis present

## 2018-04-28 DIAGNOSIS — E1151 Type 2 diabetes mellitus with diabetic peripheral angiopathy without gangrene: Secondary | ICD-10-CM | POA: Diagnosis present

## 2018-04-28 DIAGNOSIS — T82868A Thrombosis of vascular prosthetic devices, implants and grafts, initial encounter: Principal | ICD-10-CM | POA: Diagnosis present

## 2018-04-28 DIAGNOSIS — E669 Obesity, unspecified: Secondary | ICD-10-CM | POA: Diagnosis not present

## 2018-04-28 DIAGNOSIS — E1152 Type 2 diabetes mellitus with diabetic peripheral angiopathy with gangrene: Secondary | ICD-10-CM | POA: Diagnosis present

## 2018-04-28 DIAGNOSIS — D62 Acute posthemorrhagic anemia: Secondary | ICD-10-CM | POA: Diagnosis not present

## 2018-04-28 DIAGNOSIS — Z87891 Personal history of nicotine dependence: Secondary | ICD-10-CM | POA: Diagnosis not present

## 2018-04-28 DIAGNOSIS — R0682 Tachypnea, not elsewhere classified: Secondary | ICD-10-CM

## 2018-04-28 DIAGNOSIS — N183 Chronic kidney disease, stage 3 (moderate): Secondary | ICD-10-CM | POA: Diagnosis present

## 2018-04-28 DIAGNOSIS — G4733 Obstructive sleep apnea (adult) (pediatric): Secondary | ICD-10-CM | POA: Diagnosis present

## 2018-04-28 DIAGNOSIS — R066 Hiccough: Secondary | ICD-10-CM | POA: Diagnosis not present

## 2018-04-28 LAB — GLUCOSE, CAPILLARY
Glucose-Capillary: 133 mg/dL — ABNORMAL HIGH (ref 70–99)
Glucose-Capillary: 204 mg/dL — ABNORMAL HIGH (ref 70–99)

## 2018-04-28 LAB — CBC
HCT: 33.2 % — ABNORMAL LOW (ref 39.0–52.0)
Hemoglobin: 11 g/dL — ABNORMAL LOW (ref 13.0–17.0)
MCH: 26.6 pg (ref 26.0–34.0)
MCHC: 33.1 g/dL (ref 30.0–36.0)
MCV: 80.4 fL (ref 80.0–100.0)
Platelets: 173 10*3/uL (ref 150–400)
RBC: 4.13 MIL/uL — ABNORMAL LOW (ref 4.22–5.81)
RDW: 16.3 % — ABNORMAL HIGH (ref 11.5–15.5)
WBC: 7.5 10*3/uL (ref 4.0–10.5)
nRBC: 0 % (ref 0.0–0.2)

## 2018-04-28 LAB — APTT: aPTT: 41 seconds — ABNORMAL HIGH (ref 24–36)

## 2018-04-28 MED ORDER — LATANOPROST 0.005 % OP SOLN
1.0000 [drp] | Freq: Every day | OPHTHALMIC | Status: AC
  Administered 2018-04-28 – 2018-04-30 (×3): 1 [drp] via OPHTHALMIC
  Filled 2018-04-28 (×2): qty 2.5

## 2018-04-28 MED ORDER — PREGABALIN 25 MG PO CAPS
50.0000 mg | ORAL_CAPSULE | Freq: Every day | ORAL | Status: DC
  Administered 2018-04-28: 50 mg via ORAL
  Filled 2018-04-28: qty 2

## 2018-04-28 MED ORDER — SODIUM CHLORIDE 0.9 % IV SOLN
INTRAVENOUS | Status: DC
  Administered 2018-04-28 – 2018-04-29 (×2): via INTRAVENOUS

## 2018-04-28 MED ORDER — FENTANYL CITRATE (PF) 100 MCG/2ML IJ SOLN
50.0000 ug | INTRAMUSCULAR | Status: DC | PRN
  Administered 2018-04-28 – 2018-04-29 (×2): 50 ug via INTRAVENOUS
  Filled 2018-04-28 (×2): qty 2

## 2018-04-28 MED ORDER — OXYCODONE-ACETAMINOPHEN 5-325 MG PO TABS
1.0000 | ORAL_TABLET | ORAL | Status: DC | PRN
  Administered 2018-04-28 – 2018-05-09 (×14): 1 via ORAL
  Filled 2018-04-28 (×14): qty 1

## 2018-04-28 MED ORDER — ALLOPURINOL 100 MG PO TABS
100.0000 mg | ORAL_TABLET | Freq: Every day | ORAL | Status: DC
  Administered 2018-04-30 – 2018-05-09 (×10): 100 mg via ORAL
  Filled 2018-04-28 (×10): qty 1

## 2018-04-28 MED ORDER — TRIAMTERENE-HCTZ 37.5-25 MG PO TABS
1.0000 | ORAL_TABLET | Freq: Every day | ORAL | Status: DC
  Administered 2018-04-30 – 2018-05-09 (×10): 1 via ORAL
  Filled 2018-04-28 (×13): qty 1

## 2018-04-28 MED ORDER — L-METHYLFOLATE-B6-B12 3-35-2 MG PO TABS
1.0000 | ORAL_TABLET | Freq: Two times a day (BID) | ORAL | Status: DC
  Administered 2018-04-28 – 2018-05-09 (×21): 1 via ORAL
  Filled 2018-04-28 (×23): qty 1

## 2018-04-28 MED ORDER — OXYCODONE HCL 5 MG PO TABS
5.0000 mg | ORAL_TABLET | ORAL | Status: DC | PRN
  Administered 2018-04-28 – 2018-05-09 (×16): 5 mg via ORAL
  Filled 2018-04-28 (×16): qty 1

## 2018-04-28 MED ORDER — CLOPIDOGREL BISULFATE 75 MG PO TABS
75.0000 mg | ORAL_TABLET | Freq: Every day | ORAL | Status: DC
  Administered 2018-04-30 – 2018-05-01 (×2): 75 mg via ORAL
  Filled 2018-04-28 (×2): qty 1

## 2018-04-28 MED ORDER — DORZOLAMIDE HCL-TIMOLOL MAL 2-0.5 % OP SOLN
1.0000 [drp] | Freq: Two times a day (BID) | OPHTHALMIC | Status: DC
  Administered 2018-04-28 – 2018-05-09 (×20): 1 [drp] via OPHTHALMIC
  Filled 2018-04-28 (×2): qty 10

## 2018-04-28 MED ORDER — METFORMIN HCL 500 MG PO TABS: 1000.0000 mg | ORAL_TABLET | Freq: Two times a day (BID) | ORAL | Status: DC

## 2018-04-28 MED ORDER — ARGATROBAN 50 MG/50ML IV SOLN
0.6400 ug/kg/min | INTRAVENOUS | Status: DC
  Administered 2018-04-28 (×2): 1 ug/kg/min via INTRAVENOUS
  Administered 2018-04-29: 0.8 ug/kg/min via INTRAVENOUS
  Filled 2018-04-28 (×4): qty 50

## 2018-04-28 MED ORDER — INSULIN PUMP
Freq: Three times a day (TID) | SUBCUTANEOUS | Status: DC
  Administered 2018-04-28 – 2018-04-30 (×3): via SUBCUTANEOUS
  Filled 2018-04-28: qty 1

## 2018-04-28 MED ORDER — LOSARTAN POTASSIUM 50 MG PO TABS
100.0000 mg | ORAL_TABLET | Freq: Every day | ORAL | Status: DC
  Administered 2018-04-30 – 2018-05-09 (×10): 100 mg via ORAL
  Filled 2018-04-28 (×11): qty 2

## 2018-04-28 MED ORDER — COLCHICINE 0.6 MG PO TABS
0.6000 mg | ORAL_TABLET | Freq: Two times a day (BID) | ORAL | Status: DC
  Administered 2018-04-28 – 2018-05-09 (×21): 0.6 mg via ORAL
  Filled 2018-04-28 (×22): qty 1

## 2018-04-28 MED ORDER — PRAVASTATIN SODIUM 40 MG PO TABS
40.0000 mg | ORAL_TABLET | Freq: Every day | ORAL | Status: DC
  Administered 2018-04-28 – 2018-05-08 (×11): 40 mg via ORAL
  Filled 2018-04-28 (×11): qty 1

## 2018-04-28 MED ORDER — LIRAGLUTIDE 18 MG/3ML ~~LOC~~ SOPN
1.2000 mg | PEN_INJECTOR | Freq: Every day | SUBCUTANEOUS | Status: AC
  Filled 2018-04-28: qty 3

## 2018-04-28 MED ORDER — CARVEDILOL 12.5 MG PO TABS
12.5000 mg | ORAL_TABLET | Freq: Every day | ORAL | Status: DC
  Administered 2018-04-30 – 2018-05-09 (×10): 12.5 mg via ORAL
  Filled 2018-04-28 (×10): qty 1
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
## 2018-04-28 NOTE — Progress Notes (Signed)
Pt reports pain level is going up again, advised orders received but not for pain med, paged Dr [name] to advise "I was about to take one of my pills' pt and wife educated not to take home meds esp pain meds
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
## 2018-04-28 NOTE — Progress Notes (Signed)
Pt arrived to 3e18, wife at bedside and reports they came from Dr [name] office, no orders, paged MD for orders, pt c/o pain 4 if he is still or 10 -12 if he moves, he reports he took percocet 2 at 11:50. Pt alert and oriented, advised I had paged for orders and await call back

## 2018-04-28 NOTE — Progress Notes (Signed)
RN was waiting for patient's wife to bring home medication Victoza to the hospital. RN called pharmacy to ask if they carried this particular medication and was informed that it needs to be sent down in order for it to be dispensed. RN informed wife about hospital policy and having to obtain medication from pharmacy with each due time. RN also notified patient and wife that the hospital does not have any syringes for the pen. Patients wife grew upset, asked RN to give pen back and stated "Give that back. I will go get needles for him and we can administer it ourselves. Don't you worry about it. This is ridiculous." RN spoke with pharmacy to inform them about incident.  RN will continue to monitor patient.

## 2018-04-29 ENCOUNTER — Encounter (HOSPITAL_COMMUNITY)
Admission: AD | Disposition: A | Discharge status: Skilled Nursing Facility | Payer: Self-pay | Source: Home / Self Care | Attending: Cardiology

## 2018-04-29 ENCOUNTER — Ambulatory Visit (HOSPITAL_COMMUNITY): Admission: RE | Admit: 2018-04-29 | Payer: BC Managed Care – PPO | Source: Ambulatory Visit | Admitting: Cardiology

## 2018-04-29 ENCOUNTER — Inpatient Hospital Stay (HOSPITAL_COMMUNITY): Payer: BC Managed Care – PPO

## 2018-04-29 HISTORY — PX: LOWER EXTREMITY ANGIOGRAPHY: CATH118251

## 2018-04-29 HISTORY — PX: THROMBECTOMY FEMORAL ARTERY: SHX6406

## 2018-04-29 LAB — URINALYSIS, ROUTINE W REFLEX MICROSCOPIC
Bilirubin Urine: NEGATIVE
Glucose, UA: 50 mg/dL — AB
Hgb urine dipstick: NEGATIVE
Ketones, ur: NEGATIVE mg/dL
Leukocytes, UA: NEGATIVE
Nitrite: NEGATIVE
Protein, ur: NEGATIVE mg/dL
Specific Gravity, Urine: 1.013 (ref 1.005–1.030)
pH: 5 (ref 5.0–8.0)

## 2018-04-29 LAB — GLUCOSE, CAPILLARY
Glucose-Capillary: 149 mg/dL — ABNORMAL HIGH (ref 70–99)
Glucose-Capillary: 129 mg/dL — ABNORMAL HIGH (ref 70–99)
Glucose-Capillary: 41 mg/dL — CL (ref 70–99)
Glucose-Capillary: 76 mg/dL (ref 70–99)
Glucose-Capillary: 105 mg/dL — ABNORMAL HIGH (ref 70–99)
Glucose-Capillary: 133 mg/dL — ABNORMAL HIGH (ref 70–99)

## 2018-04-29 LAB — POCT ACTIVATED CLOTTING TIME
Activated Clotting Time: 422 seconds
Activated Clotting Time: 235 seconds
Activated Clotting Time: 301 seconds
Activated Clotting Time: 230 seconds

## 2018-04-29 LAB — BASIC METABOLIC PANEL
Anion gap: 8 (ref 5–15)
BUN: 21 mg/dL (ref 8–23)
CO2: 24 mmol/L (ref 22–32)
Calcium: 8.5 mg/dL — ABNORMAL LOW (ref 8.9–10.3)
Chloride: 102 mmol/L (ref 98–111)
Creatinine, Ser: 1.77 mg/dL — ABNORMAL HIGH (ref 0.61–1.24)
GFR calc Af Amer: 44 mL/min — ABNORMAL LOW (ref >60)
GFR calc non Af Amer: 38 mL/min — ABNORMAL LOW (ref >60)
Glucose, Bld: 103 mg/dL — ABNORMAL HIGH (ref 70–99)
Potassium: 3.5 mmol/L (ref 3.5–5.1)
Sodium: 134 mmol/L — ABNORMAL LOW (ref 135–145)

## 2018-04-29 LAB — CBC
HCT: 32.2 % — ABNORMAL LOW (ref 39.0–52.0)
Hemoglobin: 10.4 g/dL — ABNORMAL LOW (ref 13.0–17.0)
MCH: 26.1 pg (ref 26.0–34.0)
MCHC: 32.3 g/dL (ref 30.0–36.0)
MCV: 80.9 fL (ref 80.0–100.0)
Platelets: 188 10*3/uL (ref 150–400)
RBC: 3.98 MIL/uL — ABNORMAL LOW (ref 4.22–5.81)
RDW: 16.2 % — ABNORMAL HIGH (ref 11.5–15.5)
WBC: 7.1 10*3/uL (ref 4.0–10.5)
nRBC: 0 % (ref 0.0–0.2)

## 2018-04-29 LAB — MRSA PCR SCREENING: MRSA by PCR: POSITIVE — AB

## 2018-04-29 LAB — APTT
aPTT: 102 seconds — ABNORMAL HIGH (ref 24–36)
aPTT: 106 seconds — ABNORMAL HIGH (ref 24–36)
aPTT: 79 seconds — ABNORMAL HIGH (ref 24–36)

## 2018-04-29 SURGERY — LOWER EXTREMITY ANGIOGRAPHY
Anesthesia: LOCAL

## 2018-04-29 MED ORDER — ACETAMINOPHEN 325 MG PO TABS
650.0000 mg | ORAL_TABLET | ORAL | Status: DC | PRN
  Administered 2018-04-29 – 2018-05-08 (×4): 650 mg via ORAL
  Filled 2018-04-29 (×5): qty 2

## 2018-04-29 MED ORDER — HEPARIN (PORCINE) IN NACL 1000-0.9 UT/500ML-% IV SOLN
INTRAVENOUS | Status: DC | PRN
  Administered 2018-04-29 (×2): 500 mL

## 2018-04-29 MED ORDER — LABETALOL HCL 5 MG/ML IV SOLN
10.0000 mg | INTRAVENOUS | Status: DC | PRN
  Administered 2018-04-30: 10 mg via INTRAVENOUS
  Filled 2018-04-29: qty 4

## 2018-04-29 MED ORDER — HEPARIN (PORCINE) IN NACL 100-0.45 UNIT/ML-% IJ SOLN
500.0000 [IU]/h | INTRAMUSCULAR | Status: DC
  Administered 2018-04-29 (×2): 500 [IU]/h via INTRAVENOUS
  Filled 2018-04-29: qty 250

## 2018-04-29 MED ORDER — DEXTROSE 50 % IV SOLN
INTRAVENOUS | Status: AC
  Administered 2018-04-29: 50 mL
  Filled 2018-04-29: qty 50

## 2018-04-29 MED ORDER — MORPHINE SULFATE (PF) 4 MG/ML IV SOLN
4.0000 mg | INTRAVENOUS | Status: DC | PRN
  Administered 2018-04-29 – 2018-05-04 (×5): 4 mg via INTRAVENOUS
  Filled 2018-04-29 (×4): qty 1

## 2018-04-29 MED ORDER — SODIUM CHLORIDE 0.9% FLUSH: 3.0000 mL | INTRAVENOUS | Status: DC | PRN

## 2018-04-29 MED ORDER — ASPIRIN EC 81 MG PO TBEC
81.0000 mg | DELAYED_RELEASE_TABLET | Freq: Every day | ORAL | Status: DC
  Administered 2018-04-29 – 2018-05-09 (×10): 81 mg via ORAL
  Filled 2018-04-29 (×10): qty 1

## 2018-04-29 MED ORDER — MUPIROCIN 2 % EX OINT
1.0000 "application " | TOPICAL_OINTMENT | Freq: Two times a day (BID) | CUTANEOUS | Status: AC
  Administered 2018-04-29 – 2018-05-04 (×9): 1 via NASAL
  Filled 2018-04-29 (×3): qty 22

## 2018-04-29 MED ORDER — NITROGLYCERIN 1 MG/10 ML FOR IR/CATH LAB
INTRA_ARTERIAL | Status: AC
  Filled 2018-04-29: qty 10

## 2018-04-29 MED ORDER — MIDAZOLAM HCL 2 MG/2ML IJ SOLN
INTRAMUSCULAR | Status: DC | PRN
  Administered 2018-04-29 (×2): 1 mg via INTRAVENOUS
  Administered 2018-04-29: 2 mg via INTRAVENOUS
  Administered 2018-04-29: 1 mg via INTRAVENOUS

## 2018-04-29 MED ORDER — FENTANYL CITRATE (PF) 100 MCG/2ML IJ SOLN
INTRAMUSCULAR | Status: AC
  Filled 2018-04-29: qty 2

## 2018-04-29 MED ORDER — INSULIN ASPART 100 UNIT/ML ~~LOC~~ SOLN: 0.0000 [IU] | Freq: Three times a day (TID) | SUBCUTANEOUS | Status: DC

## 2018-04-29 MED ORDER — MIDAZOLAM HCL 2 MG/2ML IJ SOLN
INTRAMUSCULAR | Status: AC
  Filled 2018-04-29: qty 2

## 2018-04-29 MED ORDER — NITROGLYCERIN 1 MG/10 ML FOR IR/CATH LAB
INTRA_ARTERIAL | Status: DC | PRN
  Administered 2018-04-29: 600 mL

## 2018-04-29 MED ORDER — MORPHINE SULFATE (PF) 4 MG/ML IV SOLN
INTRAVENOUS | Status: AC
  Administered 2018-04-29: 4 mg via INTRAVENOUS
  Filled 2018-04-29: qty 1

## 2018-04-29 MED ORDER — MIDAZOLAM HCL 2 MG/2ML IJ SOLN: 1.0000 mg | INTRAMUSCULAR | Status: DC | PRN

## 2018-04-29 MED ORDER — PREGABALIN 50 MG PO CAPS
50.0000 mg | ORAL_CAPSULE | Freq: Every day | ORAL | Status: DC
  Administered 2018-04-29 – 2018-05-08 (×10): 50 mg via ORAL
  Filled 2018-04-29 (×10): qty 1

## 2018-04-29 MED ORDER — IODIXANOL 320 MG/ML IV SOLN
INTRAVENOUS | Status: DC | PRN
  Administered 2018-04-29: 110 mL via INTRAVENOUS

## 2018-04-29 MED ORDER — ARGATROBAN 50 MG/50ML IV SOLN
0.6400 ug/kg/min | INTRAVENOUS | Status: DC
  Filled 2018-04-29: qty 50

## 2018-04-29 MED ORDER — FENTANYL CITRATE (PF) 100 MCG/2ML IJ SOLN
INTRAMUSCULAR | Status: DC | PRN
  Administered 2018-04-29: 25 ug via INTRAVENOUS
  Administered 2018-04-29 (×5): 50 ug via INTRAVENOUS
  Administered 2018-04-29: 25 ug via INTRAVENOUS

## 2018-04-29 MED ORDER — CHLORHEXIDINE GLUCONATE CLOTH 2 % EX PADS
6.0000 | MEDICATED_PAD | Freq: Every day | CUTANEOUS | Status: AC
  Administered 2018-04-30 – 2018-05-04 (×4): 6 via TOPICAL

## 2018-04-29 MED ORDER — SODIUM CHLORIDE 0.9 % IV SOLN
INTRAVENOUS | Status: AC | PRN
  Administered 2018-04-29: 0.5 mg/h

## 2018-04-29 MED ORDER — CIPROFLOXACIN HCL 500 MG PO TABS
500.0000 mg | ORAL_TABLET | Freq: Two times a day (BID) | ORAL | Status: DC
  Administered 2018-04-29 – 2018-04-30 (×2): 500 mg via ORAL
  Filled 2018-04-29 (×2): qty 1

## 2018-04-29 MED ORDER — LIDOCAINE HCL (PF) 1 % IJ SOLN
INTRAMUSCULAR | Status: DC | PRN
  Administered 2018-04-29: 15 mL

## 2018-04-29 MED ORDER — VERAPAMIL HCL 2.5 MG/ML IV SOLN
INTRAVENOUS | Status: DC | PRN
  Administered 2018-04-29: 14:00:00 via INTRA_ARTERIAL

## 2018-04-29 MED ORDER — SODIUM CHLORIDE 0.9 % IV SOLN: 250.0000 mL | INTRAVENOUS | Status: DC | PRN

## 2018-04-29 MED ORDER — ONDANSETRON HCL 4 MG/2ML IJ SOLN
4.0000 mg | Freq: Four times a day (QID) | INTRAMUSCULAR | Status: DC | PRN
  Administered 2018-04-29: 4 mg via INTRAVENOUS
  Filled 2018-04-29: qty 2

## 2018-04-29 MED ORDER — SODIUM CHLORIDE 0.9 % IV SOLN
0.5000 mg/h | INTRAVENOUS | Status: DC
  Filled 2018-04-29 (×2): qty 10

## 2018-04-29 MED ORDER — HEPARIN SODIUM (PORCINE) 1000 UNIT/ML IJ SOLN
INTRAMUSCULAR | Status: AC
  Filled 2018-04-29: qty 2

## 2018-04-29 MED ORDER — LIDOCAINE HCL (PF) 1 % IJ SOLN
INTRAMUSCULAR | Status: AC
  Filled 2018-04-29: qty 30

## 2018-04-29 MED ORDER — SODIUM CHLORIDE 0.9 % WEIGHT BASED INFUSION
1.0000 mL/kg/h | INTRAVENOUS | Status: DC
  Administered 2018-04-29 – 2018-04-30 (×2): 1 mL/kg/h via INTRAVENOUS

## 2018-04-29 MED ORDER — HYDRALAZINE HCL 20 MG/ML IJ SOLN
5.0000 mg | INTRAMUSCULAR | Status: AC | PRN
  Administered 2018-04-30 (×2): 5 mg via INTRAVENOUS
  Filled 2018-04-29 (×2): qty 1

## 2018-04-29 MED ORDER — SODIUM CHLORIDE 0.9% FLUSH
3.0000 mL | Freq: Two times a day (BID) | INTRAVENOUS | Status: DC
  Administered 2018-04-30 – 2018-05-05 (×9): 3 mL via INTRAVENOUS

## 2018-04-29 MED ORDER — HEPARIN SODIUM (PORCINE) 1000 UNIT/ML IJ SOLN
INTRAMUSCULAR | Status: DC | PRN
  Administered 2018-04-29: 10000 [IU] via INTRAVENOUS
  Administered 2018-04-29 (×3): 2000 [IU] via INTRAVENOUS

## 2018-04-29 MED ORDER — HEPARIN (PORCINE) IN NACL 100-0.45 UNIT/ML-% IJ SOLN
500.0000 [IU]/h | INTRAMUSCULAR | Status: DC
  Filled 2018-04-29: qty 250

## 2018-04-29 SURGICAL SUPPLY — 30 items
BAG SNAP BAND KOVER 36X36 (MISCELLANEOUS) ×3 IMPLANT
BALLN COYOTE OTW 1.5X40X150 (BALLOONS) ×3
BALLN MUSTANG 5.0X40 135 (BALLOONS) ×3
BALLOON COYOTE OTW 1.5X40X150 (BALLOONS) ×2 IMPLANT
BALLOON MUSTANG 5.0X40 135 (BALLOONS) ×2 IMPLANT
CANISTER PENUMBRA ENGINE (MISCELLANEOUS) ×3 IMPLANT
CATH CROSS OVER TEMPO 5F (CATHETERS) ×3 IMPLANT
CATH INDIGO CAT6 KIT (CATHETERS) ×6 IMPLANT
CATH INFUS 135CMX50CM (CATHETERS) ×3 IMPLANT
CATH NAVICROSS ANGLED 135CM (MICROCATHETER) ×3 IMPLANT
CATH STRAIGHT 5FR 65CM (CATHETERS) ×3 IMPLANT
GUIDEWIRE ANGLED .035X150CM (WIRE) ×3 IMPLANT
KIT ENCORE 26 ADVANTAGE (KITS) ×3 IMPLANT
KIT HEART LEFT (KITS) ×3 IMPLANT
KIT MICROPUNCTURE NIT STIFF (SHEATH) ×3 IMPLANT
PACK CARDIAC CATHETERIZATION (CUSTOM PROCEDURE TRAY) ×3 IMPLANT
SHEATH PINNACLE 5F 10CM (SHEATH) ×3 IMPLANT
SHEATH PINNACLE ST 7F 65CM (SHEATH) ×3 IMPLANT
SHEATH PROBE COVER 6X72 (BAG) ×3 IMPLANT
STENT INNOVA 6X120X130 (Permanent Stent) ×3 IMPLANT
STOPCOCK MORSE 400PSI 3WAY (MISCELLANEOUS) ×3 IMPLANT
SYR MEDRAD MARK 7 150ML (SYRINGE) ×3 IMPLANT
TAPE VIPERTRACK RADIOPAQ (MISCELLANEOUS) ×2 IMPLANT
TAPE VIPERTRACK RADIOPAQUE (MISCELLANEOUS) ×1
TRANSDUCER W/STOPCOCK (MISCELLANEOUS) ×3 IMPLANT
TUBING CIL FLEX 10 FLL-RA (TUBING) ×3 IMPLANT
WIRE BENTSON .035X145CM (WIRE) ×3 IMPLANT
WIRE HI TORQ COMMND ES.014X300 (WIRE) ×3 IMPLANT
WIRE HI TORQ VERSACORE J 260CM (WIRE) ×6 IMPLANT
WIRE SPARTACORE .014X300CM (WIRE) ×3 IMPLANT

## 2018-04-29 NOTE — Progress Notes (Addendum)
ANTICOAGULATION CONSULT NOTE - Follow Up Consult  Pharmacy Consult for Heparin Indication: critical limb ischemia  Allergies  Allergen Reactions  . Heparin Other (See Comments)    Per wife: increased clotting    Patient Measurements: Height: 6\' 2"  (188 cm) Weight: 264 lb 5.3 oz (119.9 kg) IBW/kg (Calculated) : 82.2 Argatroban Dosing Weight: 119.3 kg (on 11/4 when drip begun)  Vital Signs: Temp: 100.3 F (37.9 C) (11/05 1713) Temp Source: Oral (11/05 1713) BP: 171/86 (11/05 1800) Pulse Rate: 83 (11/05 1800)  Labs: Recent Labs    04/28/18 1810 04/29/18 0210 04/29/18 0445 04/29/18 0727 04/29/18 0827  HGB 11.0*  --  10.4*  --   --   HCT 33.2*  --  32.2*  --   --   PLT 173  --  188  --   --   APTT 41* 102* 106*  --  79*  CREATININE  --   --   --  1.77*  --     Estimated Creatinine Clearance: 55.7 mL/min (A) (by C-G formula based on SCr of 1.77 mg/dL (H)).  Assessment:   67 yr old male with PVD. Pharmacy consulted for Argatroban dosing for critical limb ischemia.  Xarelto 2.5 mg BID on hold, last dose 11/4 ~9-10am.   s/p angiography - started on an alteplase infusion. Dr. [name] wants to start a low dose heparin infusion with the alteplase infusion. Heparin does not need to be adjusted to a specific target range.  Goal of Therapy:   Monitor platelets by anticoagulation protocol: Yes   Plan:  Start Heparin 500 units/hr  Alanda Slim, PharmD, [facility] Clinical Pharmacist Please see AMION for all Pharmacists' Contact Phone Numbers 04/29/2018, 6:28 PM

## 2018-04-29 NOTE — Progress Notes (Signed)
Note placed in patient's chart per pharmacy tech request.  During the peripheral cath procedure, the patient received a total of 5 mg of Versed and 300 mcg of Fentanyl across the course of the 5 hour case.   After the end of the procedure, the cath lab tech [name] wasted 1 mg of Versed and accidentally charted it as 50 mcg of Fentanyl. The mistake was recognized, the Versed was then charted as having been wasted in the pyxis. Pharmacy was notified of the pyxis error immediately. No Fentanyl was wasted during this procedure.

## 2018-04-29 NOTE — Progress Notes (Signed)
Pt last dose of xarelto was yesterday am around 9 -10am. Repeat blood sugar 129, insulin pump removed

## 2018-04-29 NOTE — Progress Notes (Signed)
Called and spoke to [place] in the cath lab, she will talk to Dr [name] in regards to having angiography today, pt will remain NPO until furhter instructions

## 2018-04-29 NOTE — Progress Notes (Signed)
Pt to cath lab in stable condition, spoke to [place] who spoke to Dr [name] and to keep argatroban infusing, wife at bedside, telemetry called to inform of pt to cath lab

## 2018-04-29 NOTE — Progress Notes (Signed)
Hypoglycemic Event  CBG: 41  Treatment: D50 IV 50 mL  Symptoms: Sweaty  Follow-up CBG: [id number] CBG Result:129  Possible Reasons for Event: Inadequate meal intake  Comments/MD notified:Ganji    Azul Coffie I
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
## 2018-04-29 NOTE — Interval H&P Note (Signed)
History and Physical Interval Note:  04/29/2018 10:53 AM  [name]  has presented today for surgery, with the diagnosis of ischemia  The various methods of treatment have been discussed with the patient and family. After consideration of risks, benefits and other options for treatment, the patient has consented to  Procedure(s): LOWER EXTREMITY ANGIOGRAPHY (N/A) and possible angioplasty  as a surgical intervention .  The patient's history has been reviewed, patient examined, no change in status, stable for surgery.  I have reviewed the patient's chart and labs.  Questions were answered to the patient's satisfaction.     Adrian Prows

## 2018-04-29 NOTE — Progress Notes (Signed)
Pts temp 102.1. No PRN Tylenol in MAR. RN notified on call MD, Virgina Jock, at [facility].  Verbal order for Blood Cultures and CXR given. RN will place orders.  Ermalinda Memos, RN

## 2018-04-29 NOTE — Progress Notes (Signed)
ANTICOAGULATION CONSULT NOTE - Follow Up Consult  Pharmacy Consult for Argatroban Indication: critical limb ischemia  No Known Allergies  Patient Measurements: Height: 6\' 2"  (188 cm) Weight: 255 lb 11.2 oz (116 kg) IBW/kg (Calculated) : 82.2 Argatroban Dosing Weight: 119.3 kg (on 11/4 when drip begun)  Vital Signs: Temp: 98.8 F (37.1 C) (11/05 0700) Temp Source: Oral (11/05 0700) BP: 121/71 (11/05 0459) Pulse Rate: 70 (11/05 0459)  Labs: Recent Labs    04/28/18 1810 04/29/18 0210 04/29/18 0445 04/29/18 0727 04/29/18 0827  HGB 11.0*  --  10.4*  --   --   HCT 33.2*  --  32.2*  --   --   PLT 173  --  188  --   --   APTT 41* 102* 106*  --  79*  CREATININE  --   --   --  1.77*  --     Estimated Creatinine Clearance: 54.8 mL/min (A) (by C-G formula based on SCr of 1.77 mg/dL (H)).  Assessment:   67 yr old male with PVD. Pharmacy consulted for Argatroban dosing for critical limb ischemia.  Xarelto 2.5 mg BID on hold, last dose 11/4 ~9-10am.    APTT is now therapeutic (79 seconds) on 0.64 mcg/kg/min Argatroban.   For lower extremity angiography this am.  Goal of Therapy:  aPTT 50-90 seconds Monitor platelets by anticoagulation protocol: Yes   Plan:   Continue Argatroban at 0.64 mcg/kg/min  Going to PV lab now, Argatroban to continue.  Will follow up post-procedure.  Xarelto 2.5 mg BID on hold.  Arty Baumgartner, [place] Pager: [phone] or phone: [phone] 04/29/2018,10:04 AM

## 2018-04-30 ENCOUNTER — Encounter (HOSPITAL_COMMUNITY)
Admission: AD | Disposition: A | Discharge status: Skilled Nursing Facility | Payer: Self-pay | Source: Home / Self Care | Attending: Cardiology

## 2018-04-30 ENCOUNTER — Encounter (HOSPITAL_COMMUNITY): Payer: Self-pay | Admitting: Cardiology

## 2018-04-30 HISTORY — PX: PERIPHERAL VASCULAR THROMBECTOMY: CATH118306

## 2018-04-30 HISTORY — PX: PERIPHERAL VASCULAR INTERVENTION: CATH118257

## 2018-04-30 LAB — GLUCOSE, CAPILLARY
Glucose-Capillary: 172 mg/dL — ABNORMAL HIGH (ref 70–99)
Glucose-Capillary: 173 mg/dL — ABNORMAL HIGH (ref 70–99)
Glucose-Capillary: 197 mg/dL — ABNORMAL HIGH (ref 70–99)
Glucose-Capillary: 209 mg/dL — ABNORMAL HIGH (ref 70–99)
Glucose-Capillary: 216 mg/dL — ABNORMAL HIGH (ref 70–99)
Glucose-Capillary: 235 mg/dL — ABNORMAL HIGH (ref 70–99)

## 2018-04-30 LAB — CBC
HCT: 29.5 % — ABNORMAL LOW (ref 39.0–52.0)
Hemoglobin: 9.3 g/dL — ABNORMAL LOW (ref 13.0–17.0)
MCH: 25.8 pg — ABNORMAL LOW (ref 26.0–34.0)
MCHC: 31.5 g/dL (ref 30.0–36.0)
MCV: 81.9 fL (ref 80.0–100.0)
Platelets: 191 10*3/uL (ref 150–400)
RBC: 3.6 MIL/uL — ABNORMAL LOW (ref 4.22–5.81)
RDW: 16.3 % — ABNORMAL HIGH (ref 11.5–15.5)
WBC: 10.4 10*3/uL (ref 4.0–10.5)
nRBC: 0 % (ref 0.0–0.2)

## 2018-04-30 LAB — BASIC METABOLIC PANEL
Anion gap: 8 (ref 5–15)
BUN: 18 mg/dL (ref 8–23)
CO2: 20 mmol/L — ABNORMAL LOW (ref 22–32)
Calcium: 8.3 mg/dL — ABNORMAL LOW (ref 8.9–10.3)
Chloride: 107 mmol/L (ref 98–111)
Creatinine, Ser: 1.53 mg/dL — ABNORMAL HIGH (ref 0.61–1.24)
GFR calc Af Amer: 53 mL/min — ABNORMAL LOW (ref >60)
GFR calc non Af Amer: 45 mL/min — ABNORMAL LOW (ref >60)
Glucose, Bld: 223 mg/dL — ABNORMAL HIGH (ref 70–99)
Potassium: 4.5 mmol/L (ref 3.5–5.1)
Sodium: 135 mmol/L (ref 135–145)

## 2018-04-30 LAB — POCT ACTIVATED CLOTTING TIME
Activated Clotting Time: 224 seconds
Activated Clotting Time: 224 seconds

## 2018-04-30 LAB — HEPARIN LEVEL (UNFRACTIONATED): Heparin Unfractionated: <0.1 IU/mL — ABNORMAL LOW (ref 0.30–0.70)

## 2018-04-30 SURGERY — PERIPHERAL VASCULAR THROMBECTOMY
Anesthesia: LOCAL

## 2018-04-30 MED ORDER — SODIUM CHLORIDE 0.9% FLUSH: 3.0000 mL | INTRAVENOUS | Status: DC | PRN

## 2018-04-30 MED ORDER — SODIUM CHLORIDE 0.9 % IV SOLN: 250.0000 mL | INTRAVENOUS | Status: DC | PRN

## 2018-04-30 MED ORDER — SODIUM CHLORIDE 0.9% FLUSH: 3.0000 mL | Freq: Two times a day (BID) | INTRAVENOUS | Status: DC

## 2018-04-30 MED ORDER — MIDAZOLAM HCL 2 MG/2ML IJ SOLN
INTRAMUSCULAR | Status: DC | PRN
  Administered 2018-04-30: 2 mg via INTRAVENOUS

## 2018-04-30 MED ORDER — ASPIRIN 81 MG PO CHEW
81.0000 mg | CHEWABLE_TABLET | ORAL | Status: AC
  Administered 2018-04-30: 81 mg via ORAL
  Filled 2018-04-30: qty 1

## 2018-04-30 MED ORDER — NITROGLYCERIN 1 MG/10 ML FOR IR/CATH LAB
INTRA_ARTERIAL | Status: DC | PRN
  Administered 2018-04-30 (×2): 300 ug via INTRA_ARTERIAL

## 2018-04-30 MED ORDER — PIPERACILLIN-TAZOBACTAM 3.375 G IVPB: 3.3750 g | Freq: Three times a day (TID) | INTRAVENOUS | Status: DC

## 2018-04-30 MED ORDER — SODIUM CHLORIDE 0.9 % WEIGHT BASED INFUSION
3.0000 mL/kg/h | INTRAVENOUS | Status: DC
  Administered 2018-04-30: 3 mL/kg/h via INTRAVENOUS

## 2018-04-30 MED ORDER — INSULIN ASPART 100 UNIT/ML ~~LOC~~ SOLN
0.0000 [IU] | SUBCUTANEOUS | Status: DC
  Administered 2018-04-30: 5 [IU] via SUBCUTANEOUS
  Administered 2018-04-30: 2 [IU] via SUBCUTANEOUS
  Administered 2018-04-30: 3 [IU] via SUBCUTANEOUS
  Administered 2018-04-30: 5 [IU] via SUBCUTANEOUS
  Administered 2018-04-30: 3 [IU] via SUBCUTANEOUS
  Administered 2018-05-01: 5 [IU] via SUBCUTANEOUS
  Administered 2018-05-01: 8 [IU] via SUBCUTANEOUS
  Administered 2018-05-01: 3 [IU] via SUBCUTANEOUS
  Administered 2018-05-01: 8 [IU] via SUBCUTANEOUS
  Administered 2018-05-01: 5 [IU] via SUBCUTANEOUS

## 2018-04-30 MED ORDER — SODIUM CHLORIDE 0.9% FLUSH
3.0000 mL | Freq: Two times a day (BID) | INTRAVENOUS | Status: DC
  Administered 2018-04-30 – 2018-05-06 (×12): 3 mL via INTRAVENOUS

## 2018-04-30 MED ORDER — HEPARIN SODIUM (PORCINE) 5000 UNIT/ML IJ SOLN
5000.0000 [IU] | Freq: Three times a day (TID) | INTRAMUSCULAR | Status: DC
  Administered 2018-04-30 – 2018-05-01 (×3): 5000 [IU] via SUBCUTANEOUS
  Filled 2018-04-30 (×3): qty 1

## 2018-04-30 MED ORDER — CEFAZOLIN SODIUM-DEXTROSE 2-3 GM-%(50ML) IV SOLR
INTRAVENOUS | Status: AC | PRN
  Administered 2018-04-30: 2 g via INTRAVENOUS

## 2018-04-30 MED ORDER — FENTANYL CITRATE (PF) 100 MCG/2ML IJ SOLN
INTRAMUSCULAR | Status: DC | PRN
  Administered 2018-04-30: 25 ug via INTRAVENOUS
  Administered 2018-04-30 (×2): 50 ug via INTRAVENOUS

## 2018-04-30 MED ORDER — LIDOCAINE HCL (PF) 1 % IJ SOLN
INTRAMUSCULAR | Status: DC | PRN
  Administered 2018-04-30: 20 mL via INTRADERMAL

## 2018-04-30 MED ORDER — INSULIN GLARGINE 100 UNIT/ML ~~LOC~~ SOLN
15.0000 [IU] | SUBCUTANEOUS | Status: DC
  Administered 2018-04-30 – 2018-05-08 (×9): 15 [IU] via SUBCUTANEOUS
  Filled 2018-04-30 (×10): qty 0.15

## 2018-04-30 MED ORDER — CEFAZOLIN SODIUM-DEXTROSE 2-4 GM/100ML-% IV SOLN
INTRAVENOUS | Status: AC
  Filled 2018-04-30: qty 100

## 2018-04-30 MED ORDER — HEPARIN (PORCINE) IN NACL 1000-0.9 UT/500ML-% IV SOLN
INTRAVENOUS | Status: DC | PRN
  Administered 2018-04-30 (×2): 500 mL

## 2018-04-30 MED ORDER — CIPROFLOXACIN HCL 500 MG PO TABS
500.0000 mg | ORAL_TABLET | Freq: Two times a day (BID) | ORAL | Status: DC
  Administered 2018-04-30 – 2018-05-03 (×6): 500 mg via ORAL
  Filled 2018-04-30 (×7): qty 1

## 2018-04-30 MED ORDER — HEPARIN SODIUM (PORCINE) 1000 UNIT/ML IJ SOLN
INTRAMUSCULAR | Status: DC | PRN
  Administered 2018-04-30: 4000 [IU] via INTRAVENOUS
  Administered 2018-04-30: 8000 [IU] via INTRAVENOUS

## 2018-04-30 MED ORDER — CEFAZOLIN SODIUM-DEXTROSE 1-4 GM/50ML-% IV SOLN
1.0000 g | Freq: Three times a day (TID) | INTRAVENOUS | Status: DC
  Administered 2018-04-30 – 2018-05-03 (×9): 1 g via INTRAVENOUS
  Filled 2018-04-30 (×10): qty 50

## 2018-04-30 MED ORDER — SODIUM CHLORIDE 0.9 % IV SOLN: INTRAVENOUS | Status: AC

## 2018-04-30 MED ORDER — IODIXANOL 320 MG/ML IV SOLN
INTRAVENOUS | Status: DC | PRN
  Administered 2018-04-30: 60 mL via INTRA_ARTERIAL

## 2018-04-30 MED ORDER — SODIUM CHLORIDE 0.9 % WEIGHT BASED INFUSION
1.0000 mL/kg/h | INTRAVENOUS | Status: DC
  Administered 2018-04-30: 1 mL/kg/h via INTRAVENOUS

## 2018-04-30 SURGICAL SUPPLY — 13 items
BALLN COYOTE OTW 3X100X150 (BALLOONS) ×3
BALLOON COYOTE OTW 3X100X150 (BALLOONS) ×2 IMPLANT
CATH EXTRAC PRONTO 7F 138CM (CATHETERS) ×3 IMPLANT
CATH NAVICROSS ST .035X135CM (MICROCATHETER) ×3 IMPLANT
DEVICE TORQUE .014-.018 (MISCELLANEOUS) ×2 IMPLANT
KIT ENCORE 26 ADVANTAGE (KITS) ×3 IMPLANT
KIT PV (KITS) ×3 IMPLANT
SHEATH PINNACLE 7F 10CM (SHEATH) ×3 IMPLANT
SYR MEDRAD MARK 7 150ML (SYRINGE) ×3 IMPLANT
TORQUE DEVICE .014-.018 (MISCELLANEOUS) ×3
TRAY PV CATH (CUSTOM PROCEDURE TRAY) ×3 IMPLANT
WIRE J 3MM .035X145CM (WIRE) ×3 IMPLANT
WIRE SPARTACORE .014X300CM (WIRE) ×3 IMPLANT

## 2018-04-30 NOTE — Interval H&P Note (Signed)
History and Physical Interval Note:  04/30/2018 8:33 AM  [name]  has presented today for surgery, with the diagnosis of LYSIS RECHECK  The various methods of treatment have been discussed with the patient and family. After consideration of risks, benefits and other options for treatment, the patient has consented to  Procedure(s): LYSIS RECHECK (N/A) as a surgical intervention and possible angioplasty  .  The patient's history has been reviewed, patient examined, no change in status, stable for surgery.  I have reviewed the patient's chart and labs.  Questions were answered to the patient's satisfaction.     [name]

## 2018-04-30 NOTE — Plan of Care (Signed)
°  Problem: Education: °Goal: Understanding of CV disease, CV risk reduction, and recovery process will improve °Outcome: Progressing °  °Problem: Activity: °Goal: Ability to return to baseline activity level will improve °Outcome: Progressing °  °Problem: Cardiovascular: °Goal: Ability to achieve and maintain adequate cardiovascular perfusion will improve °Outcome: Progressing °Goal: Vascular access site(s) Level 0-1 will be maintained °Outcome: Progressing °  °Problem: Health Behavior/Discharge Planning: °Goal: Ability to safely manage health-related needs after discharge will improve °Outcome: Progressing °  °

## 2018-04-30 NOTE — H&P (View-Only) (Signed)
Blood pressure (!) 173/91, pulse 86, temperature (!) 101.6 F (38.7 C), temperature source Oral, resp. rate (!) 24, height 6' 2"  (1.88 m), weight 121.8 kg, SpO2 96 %.  No change in physical exam. Continue to have fever spikes, will change antibiotics to broad spectrum. UA negative so far and BC negative.  Labs stable to proceed with PV angio this morning. Met his wife.

## 2018-04-30 NOTE — Progress Notes (Signed)
Blood pressure (!) 173/91, pulse 86, temperature (!) 101.6 F (38.7 C), temperature source Oral, resp. rate (!) 24, height 6' 2"  (1.88 m), weight 121.8 kg, SpO2 96 %.  No change in physical exam. Continue to have fever spikes, will change antibiotics to broad spectrum. UA negative so far and BC negative.  Labs stable to proceed with PV angio this morning. Met his wife.

## 2018-04-30 NOTE — Progress Notes (Addendum)
Inpatient Diabetes Program Recommendations  AACE/ADA: New Consensus Statement on Inpatient Glycemic Control (2019)  Target Ranges:  Prepandial:   less than 140 mg/dL      Peak postprandial:   less than 180 mg/dL (1-2 hours)      Critically ill patients:  140 - 180 mg/dL  Results for BRASEN, BUNDREN (MRN [id number]) as of 04/30/2018 09:55  Ref. Range 04/30/2018 00:22 04/30/2018 03:50 04/30/2018 08:06  Glucose-Capillary Latest Ref Range: 70 - 99 mg/dL 235 (H) 197 (H) 209 (H)   Results for SEBERT, STOLLINGS (MRN [id number]) as of 04/30/2018 09:55  Ref. Range 04/28/2018 20:16 04/29/2018 02:06 04/29/2018 06:50 04/29/2018 07:12 04/29/2018 16:09 04/29/2018 17:42 04/29/2018 20:01  Glucose-Capillary Latest Ref Range: 70 - 99 mg/dL 204 (H) 149 (H) 41 (LL) 129 (H) 76 105 (H) 133 (H)   Review of Glycemic Control  Diabetes history: DM2 Outpatient Diabetes medications: Insulin Pump (V-Go 30 - provides a total of 30 units for basal insulin/24 hours and uses clicks on pump for meal coverage), Metformin 1000 mg BID, Victoza 1.2 mg daily Current orders for Inpatient glycemic control: Novolog 0-15 units Q4H, Insulin Pump ACHS & 2am, Victoza 1.2 mg daily  Inpatient Diabetes Program Recommendations:  Insulin Pump: Please discontinue order for insulin pump since patient removed it last night and sent home with his wife (per RN). Insulin - Basal: Please consider ordering Lantus 15 units Q24H for basal insulin.  NOTE: Noted order for Insulin pump and SQ insulin Q4H and patient is currently in Cath Lab. Called [facility] and spoke with RN over the phone and she reports that per report given to her patient removed insulin pump last night and sent home with his wife. Therefore, patient does not have insulin pump on at this time. Per information that could be obtained from the chart, appears that patient has a V-Go 30 insulin pump (disposalbe insulin pump which provides a total of 30 units over 24 hour period for basal insulin). Glucose  since midnight has ranged from 197-235 mg/dl despite Novolog correction insulin. Patient will need to have basal insulin ordered since insulin pump is not in place.   Addendum 04/30/18@13 :30-Spoke with patient regarding DM control. Patient states that he uses a V-Go 30 insulin pump (provides a total of 30 units of basal insulin over 24 hours) and he uses 4-5 clicks with meals (each click is 2 units).  Patient reports that his fasting glucose is usually 100-120's mg/dl but his glucose increases up to 220's mg/dl during the day after eating.  Patient admits that he snacks a lot to which contributes to hyperglycemia. Discussed importance of DM control to decrease risk of complications from uncontrolled DM.   Informed patient that Lantus was ordered today as requested that while he is inpatient he will be ordered Lantus and Novolog so he will not need his V-Go insulin pump while inpatient. Patient verbalized understanding of information discussed and notes that he does not have any questions at this time.  Thanks, Barnie Alderman, RN, MSN, CDE Diabetes Coordinator Inpatient Diabetes Program [phone] (Team Pager from 8am to 5pm)

## 2018-04-30 NOTE — Progress Notes (Signed)
PRN labatalol given

## 2018-05-01 LAB — CBC
HCT: 24.6 % — ABNORMAL LOW (ref 39.0–52.0)
Hemoglobin: 8 g/dL — ABNORMAL LOW (ref 13.0–17.0)
MCH: 26.4 pg (ref 26.0–34.0)
MCHC: 32.5 g/dL (ref 30.0–36.0)
MCV: 81.2 fL (ref 80.0–100.0)
Platelets: 151 10*3/uL (ref 150–400)
RBC: 3.03 MIL/uL — ABNORMAL LOW (ref 4.22–5.81)
RDW: 16.4 % — ABNORMAL HIGH (ref 11.5–15.5)
WBC: 8 10*3/uL (ref 4.0–10.5)
nRBC: 0 % (ref 0.0–0.2)

## 2018-05-01 LAB — GLUCOSE, CAPILLARY
Glucose-Capillary: 195 mg/dL — ABNORMAL HIGH (ref 70–99)
Glucose-Capillary: 220 mg/dL — ABNORMAL HIGH (ref 70–99)
Glucose-Capillary: 228 mg/dL — ABNORMAL HIGH (ref 70–99)
Glucose-Capillary: 236 mg/dL — ABNORMAL HIGH (ref 70–99)
Glucose-Capillary: 275 mg/dL — ABNORMAL HIGH (ref 70–99)

## 2018-05-01 LAB — POCT ACTIVATED CLOTTING TIME: Activated Clotting Time: 158 seconds

## 2018-05-01 MED ORDER — INSULIN ASPART 100 UNIT/ML ~~LOC~~ SOLN
0.0000 [IU] | Freq: Three times a day (TID) | SUBCUTANEOUS | Status: DC
  Administered 2018-05-02: 5 [IU] via SUBCUTANEOUS
  Administered 2018-05-02: 11 [IU] via SUBCUTANEOUS
  Administered 2018-05-02: 3 [IU] via SUBCUTANEOUS
  Administered 2018-05-03: 8 [IU] via SUBCUTANEOUS
  Administered 2018-05-03: 5 [IU] via SUBCUTANEOUS
  Administered 2018-05-03 – 2018-05-04 (×4): 8 [IU] via SUBCUTANEOUS
  Administered 2018-05-05: 5 [IU] via SUBCUTANEOUS
  Administered 2018-05-05: 3 [IU] via SUBCUTANEOUS
  Administered 2018-05-05 – 2018-05-06 (×2): 11 [IU] via SUBCUTANEOUS
  Administered 2018-05-06: 5 [IU] via SUBCUTANEOUS
  Administered 2018-05-06 – 2018-05-07 (×2): 8 [IU] via SUBCUTANEOUS
  Administered 2018-05-07 (×2): 5 [IU] via SUBCUTANEOUS
  Administered 2018-05-08: 8 [IU] via SUBCUTANEOUS
  Administered 2018-05-08 (×2): 5 [IU] via SUBCUTANEOUS
  Administered 2018-05-09 (×2): 8 [IU] via SUBCUTANEOUS

## 2018-05-01 MED ORDER — RIVAROXABAN 15 MG PO TABS
15.0000 mg | ORAL_TABLET | Freq: Two times a day (BID) | ORAL | Status: DC
  Administered 2018-05-01 – 2018-05-09 (×16): 15 mg via ORAL
  Filled 2018-05-01 (×17): qty 1

## 2018-05-01 MED ORDER — RIVAROXABAN 20 MG PO TABS: 20.0000 mg | ORAL_TABLET | Freq: Every day | ORAL | Status: DC

## 2018-05-01 NOTE — Progress Notes (Signed)
Inpatient Diabetes Program Recommendations  AACE/ADA: New Consensus Statement on Inpatient Glycemic Control (2019)  Target Ranges:  Prepandial:   less than 140 mg/dL      Peak postprandial:   less than 180 mg/dL (1-2 hours)      Critically ill patients:  140 - 180 mg/dL   Results for [name], [name] (MRN [id number]) as of 05/01/2018 11:00  Ref. Range 04/30/2018 08:06 04/30/2018 12:01 04/30/2018 15:52 04/30/2018 20:33 05/01/2018 00:00 05/01/2018 04:51 05/01/2018 08:11  Glucose-Capillary Latest Ref Range: 70 - 99 mg/dL 209 (H) 216 (H) 173 (H) 172 (H) 228 (H) 220 (H) 195 (H)   Review of Glycemic Control  Diabetes history: DM2 Outpatient Diabetes medications: Insulin Pump (V-Go 30 - provides a total of 30 units for basal insulin/24 hours and uses clicks on pump for meal coverage), Metformin 1000 mg BID, Victoza 1.2 mg daily Current orders for Inpatient glycemic control: Novolog 0-15 units Q4H, Lantus 15 units Q24H  Inpatient Diabetes Program Recommendations:  Insulin - Basal: Please consider increasing Lantus to 20 units Q24H (starting 05/02/18) for basal insulin. If MD agreeable with increasing Lantus, please order one time Lantus 5 units x1 now (for total of 20 units today since patient received Lantus 15 units this morning). Insulin-Meal Coverage: Please consider ordering Novolog 3 units TID with meals for meal coverage if patient eats at least 50% of meals.  Thanks, Barnie Alderman, RN, MSN, CDE Diabetes Coordinator Inpatient Diabetes Program [phone] (Team Pager from 8am to 5pm)

## 2018-05-01 NOTE — Discharge Instructions (Addendum)
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
## 2018-05-01 NOTE — Progress Notes (Signed)
Subjective:  His much better but still present.  He is now able to sit up on the chair.  He is able to find comfortable position where he has no pain.wife present.  Objective:  Vital Signs in the last 24 hours: Temp:  [98.6 F (37 C)-100 F (37.8 C)] 98.6 F (37 C) (11/07 0800) Pulse Rate:  [76-91] 90 (11/07 1130) Resp:  [16-33] 26 (11/07 1130) BP: (87-142)/(54-92) 87/69 (11/07 1130) SpO2:  [85 %-100 %] 97 % (11/07 1130)  Intake/Output from previous day: 11/06 0701 - 11/07 0700 In: 580 [P.O.:480; IV Piggyback:100] Out: 1350 [Urine:1350]  Physical Exam: Constitutional: He is oriented to person, place, and time. He appears well-developed.  Cardiovascular: Normal rate, regular rhythm and normal heart sounds.  Right groin without hematoma. Absent bilateral pedal pulse. Left foot now is warm.   Pulmonary/Chest: Effort normal and breath sounds normal.  Abdominal: Soft. Bowel sounds are normal.  Musculoskeletal: He exhibits edema (2 plus bilaterl left > right).  Neurological: He is oriented to person, place, and time.  Skin: Skin is warm.  Lab Results: BMP Recent Labs    04/29/18 0727 04/30/18 0717  NA 134* 135  K 3.5 4.5  CL 102 107  CO2 24 20*  GLUCOSE 103* 223*  BUN 21 18  CREATININE 1.77* 1.53*  CALCIUM 8.5* 8.3*  GFRNONAA 38* 45*  GFRAA 44* 53*    CBC Recent Labs  Lab 05/01/18 0403  WBC 8.0  RBC 3.03*  HGB 8.0*  HCT 24.6*  PLT 151  MCV 81.2  MCH 26.4  MCHC 32.5  RDW 16.4*    HEMOGLOBIN A1C Lab Results  Component Value Date   HGBA1C 8.5 03/15/2016   MPG 229 (H) 12/02/2012   Lipid Panel     Component Value Date/Time   LDLCALC 115 03/15/2016   Assessment/Plan:  1. Critical limb ischemia, left leg. 04/30/2018: PTA and balloon angioplasty of the occluded left mid anterior tibial artery, thrombotic occlusion, 100% to 0% with brisk flow at the level of the ankle, it reconstitutes the posterior tibial artery.  Thrombectomy, aspiration of the peroneal  artery, thrombotic 100% reduced to less than 10%.  Brisk flow was evident in both the anterior tibial and peroneal arteries at the end of the procedure.  The popliteal artery had organized thrombus with a diffuse 50% stenosis. The Viabahn stent placed in the left distal SFA, proximal popliteal artery remains occluded.   2.  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 Renal function stable 3.  Hypertension. Will allow permissive hypertension 4. Blood loss anemia. No overt bleeding.  Rec: transfer patient to telemetry, will have physical therapy see the patient.  I will start the patient on Xarelto 50 mg p.o. B.i.d. Dosing.  In view of marked thrombotic event, discontinue Plavix.  This is to reduce further risk of thrombotic complications and embolic complication as the clot in the left popliteal artery is still very fresh and yesterday he did have significant amount of thrombus burden that was aspirated again in the small vessels.  I'll obtain ABI.   Adrian Prows, M.D. 05/01/2018, 12:09 PM [facility], [place] Pager: [phone] Office: [phone] If no answer: [phone]

## 2018-05-02 ENCOUNTER — Inpatient Hospital Stay (HOSPITAL_COMMUNITY): Payer: BC Managed Care – PPO

## 2018-05-02 DIAGNOSIS — I998 Other disorder of circulatory system: Secondary | ICD-10-CM

## 2018-05-02 LAB — CBC
HCT: 25.4 % — ABNORMAL LOW (ref 39.0–52.0)
Hemoglobin: 8 g/dL — ABNORMAL LOW (ref 13.0–17.0)
MCH: 25.4 pg — ABNORMAL LOW (ref 26.0–34.0)
MCHC: 31.5 g/dL (ref 30.0–36.0)
MCV: 80.6 fL (ref 80.0–100.0)
Platelets: 200 10*3/uL (ref 150–400)
RBC: 3.15 MIL/uL — ABNORMAL LOW (ref 4.22–5.81)
RDW: 16.5 % — ABNORMAL HIGH (ref 11.5–15.5)
WBC: 11.9 10*3/uL — ABNORMAL HIGH (ref 4.0–10.5)
nRBC: 0 % (ref 0.0–0.2)

## 2018-05-02 LAB — GLUCOSE, CAPILLARY
Glucose-Capillary: 251 mg/dL — ABNORMAL HIGH (ref 70–99)
Glucose-Capillary: 253 mg/dL — ABNORMAL HIGH (ref 70–99)
Glucose-Capillary: 337 mg/dL — ABNORMAL HIGH (ref 70–99)
Glucose-Capillary: 193 mg/dL — ABNORMAL HIGH (ref 70–99)
Glucose-Capillary: 275 mg/dL — ABNORMAL HIGH (ref 70–99)

## 2018-05-02 MED ORDER — PROCHLORPERAZINE EDISYLATE 10 MG/2ML IJ SOLN
10.0000 mg | Freq: Three times a day (TID) | INTRAMUSCULAR | Status: AC
  Administered 2018-05-02 – 2018-05-03 (×3): 10 mg via INTRAVENOUS
  Filled 2018-05-02 (×3): qty 2

## 2018-05-02 NOTE — Progress Notes (Signed)
VASCULAR LAB PRELIMINARY  ARTERIAL  ABI completed: Right: Resting right ankle-brachial index indicates mild right lower extremity arterial disease. The right toe-brachial index is normal.   Left: Resting left ankle-brachial index indicates severe left lower extremity arterial disease. The left toe-brachial index is abnormal.     RIGHT    LEFT    PRESSURE WAVEFORM  PRESSURE WAVEFORM  BRACHIAL 131 Triphasic BRACHIAL 132 Triphasic  DP 108 Triphasic DP 52 Dampened monophasic  PT 98 Triphasic PT 57 Dampened monophasic  GREAT TOE 108  GREAT TOE 0 Absent    RIGHT LEFT  ABI/TBI 0.82/0.82 0.43/0.00     Keily Lepp H Gisela Lea(RDMS RVT)  05/02/2018, 2:32 PM

## 2018-05-02 NOTE — Progress Notes (Addendum)
Subjective:  No new complaints. Continues to have Hiccoughs.  Objective:  Vital Signs in the last 24 hours: Temp:  [98.6 F (37 C)-99.4 F (37.4 C)] 98.9 F (37.2 C) (11/08 1217) Pulse Rate:  [69-75] 72 (11/08 1435) Resp:  [14-26] 14 (11/08 1217) BP: (106-138)/(69-84) 138/79 (11/08 1435) SpO2:  [96 %-98 %] 98 % (11/08 1217)  Intake/Output from previous day: 11/07 0701 - 11/08 0700 In: 487.9 [P.O.:120; IV Piggyback:367.9] Out: 2150 [Urine:2150]  Physical Exam: Constitutional: He is oriented to person, place, and time. He appears well-developed.  Cardiovascular: Normal rate, regular rhythm and normal heart sounds.  Right groin without hematoma. Absent bilateral pedal pulse. Left foot now is warm.   Pulmonary/Chest: Effort normal and breath sounds normal.  Abdominal: Soft. Bowel sounds are normal.  Musculoskeletal: He exhibits edema (2 plus bilaterl left > right).  Neurological: He is oriented to person, place, and time.  Skin: Skin is warm.  Lab Results: BMP Recent Labs    04/29/18 0727 04/30/18 0717  NA 134* 135  K 3.5 4.5  CL 102 107  CO2 24 20*  GLUCOSE 103* 223*  BUN 21 18  CREATININE 1.77* 1.53*  CALCIUM 8.5* 8.3*  GFRNONAA 38* 45*  GFRAA 44* 53*    CBC Recent Labs  Lab 05/02/18 0350  WBC 11.9*  RBC 3.15*  HGB 8.0*  HCT 25.4*  PLT 200  MCV 80.6  MCH 25.4*  MCHC 31.5  RDW 16.5*    HEMOGLOBIN A1C Lab Results  Component Value Date   HGBA1C 8.5 03/15/2016   MPG 229 (H) 12/02/2012   Lipid Panel     Component Value Date/Time   LDLCALC 115 03/15/2016   Scheduled Meds: . allopurinol  100 mg Oral Daily  . aspirin EC  81 mg Oral Daily  . carvedilol  12.5 mg Oral Q1400  . Chlorhexidine Gluconate Cloth  6 each Topical Q0600  . ciprofloxacin  500 mg Oral BID  . colchicine  0.6 mg Oral BID  . dorzolamide-timolol  1 drop Both Eyes BID  . insulin aspart  0-15 Units Subcutaneous TID WC  . insulin glargine  15 Units Subcutaneous Q24H  .  l-methylfolate-B6-B12  1 tablet Oral BID  . losartan  100 mg Oral Q1400  . mupirocin ointment  1 application Nasal BID  . pravastatin  40 mg Oral QHS  . pregabalin  50 mg Oral QHS  . rivaroxaban  15 mg Oral BID WC   Followed by  . [START ON 05/23/2018] rivaroxaban  20 mg Oral Q supper  . sodium chloride flush  3 mL Intravenous Q12H  . sodium chloride flush  3 mL Intravenous Q12H  . triamterene-hydrochlorothiazide  1 tablet Oral Q1400   Continuous Infusions: . sodium chloride    . sodium chloride    .  ceFAZolin (ANCEF) IV 1 g (05/02/18 1438)   PRN Meds:.sodium chloride, sodium chloride, acetaminophen, labetalol, morphine injection, ondansetron (ZOFRAN) IV, oxyCODONE-acetaminophen **AND** oxyCODONE, sodium chloride flush, sodium chloride flush   Assessment/Plan:  1. Critical limb ischemia, left leg. 04/30/2018: PTA and balloon angioplasty of the occluded left mid anterior tibial artery, thrombotic occlusion, 100% to 0% with brisk flow at the level of the ankle, it reconstitutes the posterior tibial artery.  Thrombectomy, aspiration of the peroneal artery, thrombotic 100% reduced to less than 10%.  Brisk flow was evident in both the anterior tibial and peroneal arteries at the end of the procedure.  The popliteal artery had organized thrombus with a diffuse 50%  stenosis. The Viabahn stent placed in the left distal SFA, proximal popliteal artery remains occluded.   ABI completed: Right: Resting right ankle-brachial index indicates mild right lower extremity arterial disease. The right toe-brachial index is normal.   Left: Resting left ankle-brachial index indicates severe left lower extremity arterial disease. The left toe-brachial index is abnormal.     RIGHT    LEFT    PRESSURE WAVEFORM  PRESSURE WAVEFORM  BRACHIAL 131 Triphasic BRACHIAL 132 Triphasic  DP 108 Triphasic DP 52 Dampened monophasic  PT 98 Triphasic PT 57 Dampened monophasic  GREAT TOE 108  GREAT TOE 0  Absent    RIGHT LEFT  ABI/TBI 0.82/0.82 0.43/0.00    2.  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 Renal function stable 3.  Hypertension. Will allow permissive hypertension 4. Blood loss anemia. No overt bleeding. 5. Fever with chills and rigors.   Rec: He is stable but has not walked, I'll have physical therapy see him this morning, continue Xarelto and Plavix for now.  Keep him in-house today, possible discharge in the morning.    I have discussed regarding DM again. Hopefully he will loose weight and get his DM under control. No further fever and blood cultures negative. Urine cultures were not sent. Will change to PO tomorrow to complete a 7 day course in  View of instrumentation.  Adrian Prows, M.D. 05/02/2018, 3:15 PM [facility], [place] Pager: [phone] Office: [phone] If no answer: [phone]

## 2018-05-02 NOTE — Progress Notes (Signed)
Results for BLESSED, COTHAM (MRN [id number]) as of 05/02/2018 08:17  Ref. Range 05/01/2018 00:00 05/01/2018 04:51 05/01/2018 08:11 05/01/2018 12:31 05/01/2018 16:22  Glucose-Capillary Latest Ref Range: 70 - 99 mg/dL 228 (H) 220 (H) 195 (H) 236 (H) 275 (H)  Noted that blood sugars continue to be greater than 180 mg/dl.  Recommend adding Novolog 3 units TID with meals if blood sugars continue to be elevated.   Harvel Ricks RN BSN CDE Diabetes Coordinator Pager: [phone]  8am-5pm

## 2018-05-02 NOTE — Progress Notes (Signed)
Pt and wife requested to try and walk tonight. Pt walked from the bed to the doorway and back, total time about 20 minutes. Tolerated fairly, most limiting factor being surgical and neuropathic pain. Pt assisted back to bed, call bell within reach. Will continue to monitor.  Jaymes Graff, RN

## 2018-05-02 NOTE — Evaluation (Signed)
Physical Therapy Evaluation Patient Details Name: [name] MRN: [id number] DOB: December 18, 1950 Today's Date: 05/02/2018   History of Present Illness  67yo male with ongoing pain due to peripheral artery disease and with abnormal ABI. He received peripheral vascular cath on 10/29, 11/5, 11/6, and lysis recheck on 11/6, also thrombectomy on 11/7. PMH DM, distant history of TB, HTN, diabetic neuropathy, multiple vascular surgery interventions   Clinical Impression   Patient received sitting at EOB with legs abducted, pleasant but very anxious and reporting significant pain levels; very talkative and required frequent redirection during session. Able to perform sit to stand from elevated surface with MinA for safety and steadying, extremely pain limited L LE and self-imposed NWB on this side due to pain, however has reduced upper body strength and so had difficulty in mobilizing with RW. Able to perform stand-pivot transfer to chair with RW and MinA for safety and balance, cues for appropriate use of RW. Needed extended time for stand to sit with mod cues for sequencing due to pain L LE. He was left up in the chair with all needs met, LEs elevated, and all questions/concerns addressed. He will continue to benefit from skilled PT services in the acute setting, also recommend ST-SNF moving forward due to poor activity tolerance, poor mobility, and severe pain limitations this session.     Follow Up Recommendations SNF    Equipment Recommendations  Rolling walker with 5" wheels;3in1 (PT);Wheelchair (measurements PT);Wheelchair cushion (measurements PT)(with elevating leg rest L )    Recommendations for Other Services       Precautions / Restrictions Precautions Precautions: Fall Restrictions Weight Bearing Restrictions: No Other Position/Activity Restrictions: self-imposed NWB L LE due to pain       Mobility  Bed Mobility               General bed mobility comments: sitting EOB    Transfers Overall transfer level: Needs assistance Equipment used: Rolling walker (2 wheeled) Transfers: Sit to/from [facility] Sit to Stand: Min assist Stand pivot transfers: Min assist       General transfer comment: MinA for balance and steadying for all OOB/transfer activity; patient extremely pain limited and self-imposed NWB L LE, difficulty pushing down on RW due to reduced UE strength   Ambulation/Gait             General Gait Details: deferred due to pain   Stairs            Wheelchair Mobility    Modified Rankin (Stroke Patients Only)       Balance Overall balance assessment: Needs assistance Sitting-balance support: Bilateral upper extremity supported;Feet supported Sitting balance-Leahy Scale: Good     Standing balance support: Bilateral upper extremity supported;During functional activity Standing balance-Leahy Scale: Poor Standing balance comment: MinA for balance, heavy B UE support on RW                              Pertinent Vitals/Pain Pain Assessment: Faces Faces Pain Scale: Hurts whole lot Pain Location: L LE  Pain Descriptors / Indicators: Throbbing;Aching;Burning Pain Intervention(s): Limited activity within patient's tolerance;Monitored during session;Repositioned    Home Living Family/patient expects to be discharged to:: Private residence Living Arrangements: Spouse/significant other Available Help at Discharge: Family Type of Home: House(able to live in basement ) Home Access: Level entry;Stairs to enter   Entrance Stairs-Number of Steps: stairs at main enterance but uses level entry in basement  Home Layout: Two level Home Equipment: None      Prior Function Level of Independence: Independent               Hand Dominance        Extremity/Trunk Assessment   Upper Extremity Assessment Upper Extremity Assessment: Defer to OT evaluation    Lower Extremity Assessment Lower  Extremity Assessment: Generalized weakness    Cervical / Trunk Assessment Cervical / Trunk Assessment: Normal  Communication   Communication: No difficulties  Cognition Arousal/Alertness: Awake/alert Behavior During Therapy: Anxious Overall Cognitive Status: Within Functional Limits for tasks assessed                                        General Comments General comments (skin integrity, edema, etc.): extremely pain limited this session    Exercises     Assessment/Plan    PT Assessment Patient needs continued PT services  PT Problem List Decreased strength;Decreased knowledge of use of DME;Decreased activity tolerance;Decreased safety awareness;Decreased balance;Pain;Decreased mobility;Decreased coordination       PT Treatment Interventions DME instruction;Balance training;Gait training;Neuromuscular re-education;Stair training;Functional mobility training;Patient/family education;Therapeutic activities;Therapeutic exercise;Manual techniques    PT Goals (Current goals can be found in the Care Plan section)  Acute Rehab PT Goals Patient Stated Goal: less pain  PT Goal Formulation: With patient Time For Goal Achievement: 05/16/18 Potential to Achieve Goals: Fair    Frequency Min 3X/week   Barriers to discharge        Co-evaluation               AM-PAC PT "6 Clicks" Daily Activity  Outcome Measure Difficulty turning over in bed (including adjusting bedclothes, sheets and blankets)?: A Little Difficulty moving from lying on back to sitting on the side of the bed? : A Little Difficulty sitting down on and standing up from a chair with arms (e.g., wheelchair, bedside commode, etc,.)?: A Lot Help needed moving to and from a bed to chair (including a wheelchair)?: A Lot Help needed walking in hospital room?: Total Help needed climbing 3-5 steps with a railing? : Total 6 Click Score: 12    End of Session   Activity Tolerance: Patient limited by  pain Patient left: in chair;with call bell/phone within reach Nurse Communication: Mobility status;Patient requests pain meds PT Visit Diagnosis: Unsteadiness on feet (R26.81);Difficulty in walking, not elsewhere classified (R26.2);Pain;Muscle weakness (generalized) (M62.81);Other abnormalities of gait and mobility (R26.89) Pain - Right/Left: Left Pain - part of body: Leg    Time: 1110-1140 PT Time Calculation (min) (ACUTE ONLY): 30 min   Charges:   PT Evaluation $PT Eval Moderate Complexity: 1 Mod PT Treatments $Therapeutic Activity: 8-22 mins        Deniece Ree PT, DPT, CBIS  Supplemental Physical Therapist [place]    Pager [phone] Acute Rehab Office [phone]

## 2018-05-03 LAB — CBC
HCT: 25.3 % — ABNORMAL LOW (ref 39.0–52.0)
Hemoglobin: 8.1 g/dL — ABNORMAL LOW (ref 13.0–17.0)
MCH: 25.7 pg — ABNORMAL LOW (ref 26.0–34.0)
MCHC: 32 g/dL (ref 30.0–36.0)
MCV: 80.3 fL (ref 80.0–100.0)
Platelets: 250 10*3/uL (ref 150–400)
RBC: 3.15 MIL/uL — ABNORMAL LOW (ref 4.22–5.81)
RDW: 16.3 % — ABNORMAL HIGH (ref 11.5–15.5)
WBC: 11.7 10*3/uL — ABNORMAL HIGH (ref 4.0–10.5)
nRBC: 0 % (ref 0.0–0.2)

## 2018-05-03 LAB — GLUCOSE, CAPILLARY
Glucose-Capillary: 274 mg/dL — ABNORMAL HIGH (ref 70–99)
Glucose-Capillary: 295 mg/dL — ABNORMAL HIGH (ref 70–99)

## 2018-05-03 MED ORDER — MAGNESIUM HYDROXIDE 400 MG/5ML PO SUSP
15.0000 mL | Freq: Every day | ORAL | Status: DC | PRN
  Administered 2018-05-03: 15 mL via ORAL
  Filled 2018-05-03 (×2): qty 30

## 2018-05-03 MED ORDER — AMOXICILLIN-POT CLAVULANATE 875-125 MG PO TABS
1.0000 | ORAL_TABLET | Freq: Two times a day (BID) | ORAL | Status: AC
  Administered 2018-05-03 – 2018-05-06 (×6): 1 via ORAL
  Filled 2018-05-03 (×6): qty 1

## 2018-05-03 NOTE — Progress Notes (Signed)
Subjective:  No new complaints. Having difficulty with mobility due to pain  Objective:  Vital Signs in the last 24 hours: Temp:  [98.9 F (37.2 C)-100.7 F (38.2 C)] 100.7 F (38.2 C) (11/09 0402) Pulse Rate:  [69-72] 71 (11/09 0402) Resp:  [14-24] 21 (11/09 0402) BP: (113-145)/(69-91) 113/69 (11/09 0402) SpO2:  [93 %-98 %] 93 % (11/09 0402)  Intake/Output from previous day: 11/08 0701 - 11/09 0700 In: -  Out: 1845 [Urine:1845]  Physical Exam: Blood pressure 113/69, pulse 71, temperature (!) 100.7 F (38.2 C), temperature source Oral, resp. rate (!) 21, height 6\' 2"  (1.88 m), weight 121.8 kg, SpO2 93 %.   Constitutional: He is oriented to person, place, and time. He appears well-developed.  Cardiovascular: Normal rate, regular rhythm and normal heart sounds.  Right groin without hematoma. Absent bilateral pedal pulse. Left foot now is warm.   Pulmonary/Chest: Effort normal and breath sounds normal.  Abdominal: Soft. Bowel sounds are normal.  Musculoskeletal: He exhibits edema (2 plus bilaterl left > right).  Neurological: He is oriented to person, place, and time.  Skin: Skin is warm.  Lab Results: BMP Recent Labs    04/29/18 0727 04/30/18 0717  NA 134* 135  K 3.5 4.5  CL 102 107  CO2 24 20*  GLUCOSE 103* 223*  BUN 21 18  CREATININE 1.77* 1.53*  CALCIUM 8.5* 8.3*  GFRNONAA 38* 45*  GFRAA 44* 53*    CBC Recent Labs  Lab 05/03/18 0253  WBC 11.7*  RBC 3.15*  HGB 8.1*  HCT 25.3*  PLT 250  MCV 80.3  MCH 25.7*  MCHC 32.0  RDW 16.3*    HEMOGLOBIN A1C Lab Results  Component Value Date   HGBA1C 8.5 03/15/2016   MPG 229 (H) 12/02/2012   Lipid Panel     Component Value Date/Time   LDLCALC 115 03/15/2016   Scheduled Meds: . allopurinol  100 mg Oral Daily  . aspirin EC  81 mg Oral Daily  . carvedilol  12.5 mg Oral Q1400  . Chlorhexidine Gluconate Cloth  6 each Topical Q0600  . ciprofloxacin  500 mg Oral BID  . colchicine  0.6 mg Oral BID  .  dorzolamide-timolol  1 drop Both Eyes BID  . insulin aspart  0-15 Units Subcutaneous TID WC  . insulin glargine  15 Units Subcutaneous Q24H  . l-methylfolate-B6-B12  1 tablet Oral BID  . losartan  100 mg Oral Q1400  . mupirocin ointment  1 application Nasal BID  . pravastatin  40 mg Oral QHS  . pregabalin  50 mg Oral QHS  . rivaroxaban  15 mg Oral BID WC   Followed by  . [START ON 05/23/2018] rivaroxaban  20 mg Oral Q supper  . sodium chloride flush  3 mL Intravenous Q12H  . sodium chloride flush  3 mL Intravenous Q12H  . triamterene-hydrochlorothiazide  1 tablet Oral Q1400   Continuous Infusions: . sodium chloride    . sodium chloride    .  ceFAZolin (ANCEF) IV 1 g (05/03/18 0620)   PRN Meds:.sodium chloride, sodium chloride, acetaminophen, labetalol, morphine injection, ondansetron (ZOFRAN) IV, oxyCODONE-acetaminophen **AND** oxyCODONE, sodium chloride flush, sodium chloride flush   Assessment/Plan:  1. Critical limb ischemia, left leg. 04/30/2018: PTA and balloon angioplasty of the occluded left mid anterior tibial artery, thrombotic occlusion, 100% to 0% with brisk flow at the level of the ankle, it reconstitutes the posterior tibial artery.  Thrombectomy, aspiration of the peroneal artery, thrombotic 100% reduced to less  than 10%.  Brisk flow was evident in both the anterior tibial and peroneal arteries at the end of the procedure.  The popliteal artery had organized thrombus with a diffuse 50% stenosis. The Viabahn stent placed in the left distal SFA, proximal popliteal artery remains occluded.   ABI completed: Right: Resting right ankle-brachial index indicates mild right lower extremity arterial disease. The right toe-brachial index is normal.   Left: Resting left ankle-brachial index indicates severe left lower extremity arterial disease. The left toe-brachial index is abnormal.     RIGHT    LEFT    PRESSURE WAVEFORM  PRESSURE WAVEFORM  BRACHIAL 131 Triphasic  BRACHIAL 132 Triphasic  DP 108 Triphasic DP 52 Dampened monophasic  PT 98 Triphasic PT 57 Dampened monophasic  GREAT TOE 108  GREAT TOE 0 Absent    RIGHT LEFT  ABI/TBI 0.82/0.82 0.43/0.00    2.  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 Renal function stable 3.  Hypertension. Will allow permissive hypertension 4. Blood loss anemia. No overt bleeding. 5. Fever with chills resolved but still has low grade temperature.    Rec: I do not think he will be able to go home without assistance.  I will have physical medicine to evaluate him.  I will change IV antibiotics to p.o.  Leg is still warm and viable.  Continue anticoagulation with Xarelto.  Will start Augmentin and discontinue Cipro.  Adrian Prows, M.D. 05/03/2018, 12:06 PM [facility] Pager: [phone] Office: [phone] If no answer: [phone]

## 2018-05-03 NOTE — Progress Notes (Signed)
Physical Therapy Treatment Patient Details Name: [name] MRN: [id number] DOB: 1951-06-13 Today's Date: 05/03/2018    History of Present Illness 67yo male with ongoing pain due to peripheral artery disease and with abnormal ABI. He received peripheral vascular cath on 10/29, 11/5, 11/6, and lysis recheck on 11/6, also thrombectomy on 11/7. PMH DM, distant history of TB, HTN, diabetic neuropathy, multiple vascular surgery interventions     PT Comments    Patient requesting PT assist with transferring him from the bed to the [facility]. Pt requiring moderate assistance to stand from a heavy bed. Min assistance provided to transfer with the walker from the bed to the [facility]. Once on the [facility], pt attempting to have a bowel movement for extended period and began eating his lunch. Gave him the call bell and instructed him to notify NT when finished; NT also aware. Patient continues to be limited by pain, weakness, and decreased activity tolerance.     Follow Up Recommendations  SNF     Equipment Recommendations  Rolling walker with 5" wheels;3in1 (PT);Wheelchair (measurements PT);Wheelchair cushion (measurements PT)    Recommendations for Other Services       Precautions / Restrictions Precautions Precautions: Fall Restrictions Weight Bearing Restrictions: No RLE Weight Bearing: Weight bearing as tolerated    Mobility  Bed Mobility               General bed mobility comments: sitting EOB with legs abducted  Transfers Overall transfer level: Needs assistance Equipment used: Rolling walker (2 wheeled) Transfers: Sit to/from [facility] Sit to Stand: Mod assist Stand pivot transfers: Min assist       General transfer comment: Moderate assistance to stand from EOB with cues for hand positioning. MinA for stand pivot from bed to chair; pt with no significant weightbearing through LLE. Cues for positioning/direction  Ambulation/Gait                 Stairs             Wheelchair Mobility    Modified Rankin (Stroke Patients Only)       Balance Overall balance assessment: Needs assistance Sitting-balance support: Bilateral upper extremity supported;Feet supported Sitting balance-Leahy Scale: Good     Standing balance support: Bilateral upper extremity supported;During functional activity Standing balance-Leahy Scale: Poor                              Cognition Arousal/Alertness: Awake/alert Behavior During Therapy: Anxious Overall Cognitive Status: Within Functional Limits for tasks assessed                                        Exercises      General Comments        Pertinent Vitals/Pain Pain Assessment: Faces Faces Pain Scale: Hurts even more Pain Location: LLE Pain Descriptors / Indicators: Throbbing;Aching;Burning Pain Intervention(s): Monitored during session    Home Living                      Prior Function            PT Goals (current goals can now be found in the care plan section) Acute Rehab PT Goals Patient Stated Goal: less pain  Potential to Achieve Goals: Fair    Frequency    Min 3X/week  PT Plan Current plan remains appropriate    Co-evaluation              AM-PAC PT "6 Clicks" Daily Activity  Outcome Measure  Difficulty turning over in bed (including adjusting bedclothes, sheets and blankets)?: A Little Difficulty moving from lying on back to sitting on the side of the bed? : A Little Difficulty sitting down on and standing up from a chair with arms (e.g., wheelchair, bedside commode, etc,.)?: A Lot Help needed moving to and from a bed to chair (including a wheelchair)?: A Little Help needed walking in hospital room?: A Lot Help needed climbing 3-5 steps with a railing? : Total 6 Click Score: 14    End of Session Equipment Utilized During Treatment: Gait belt Activity Tolerance: Patient limited by pain Patient left: Other  (comment)(on BSC with call bell; NT notified) Nurse Communication: Mobility status PT Visit Diagnosis: Unsteadiness on feet (R26.81);Difficulty in walking, not elsewhere classified (R26.2);Pain;Muscle weakness (generalized) (M62.81);Other abnormalities of gait and mobility (R26.89) Pain - Right/Left: Left Pain - part of body: Leg     Time: 7356-7014 PT Time Calculation (min) (ACUTE ONLY): 19 min  Charges:  $Therapeutic Activity: 8-22 mins                     [name], PT, DPT Acute Rehabilitation Services Pager [phone] Office [phone]    [name] 05/03/2018, 5:06 PM

## 2018-05-04 LAB — CULTURE, BLOOD (ROUTINE X 2)
Culture: NO GROWTH
Culture: NO GROWTH
Special Requests: ADEQUATE
Special Requests: ADEQUATE

## 2018-05-04 LAB — GLUCOSE, CAPILLARY
Glucose-Capillary: 278 mg/dL — ABNORMAL HIGH (ref 70–99)
Glucose-Capillary: 294 mg/dL — ABNORMAL HIGH (ref 70–99)
Glucose-Capillary: 260 mg/dL — ABNORMAL HIGH (ref 70–99)
Glucose-Capillary: 249 mg/dL — ABNORMAL HIGH (ref 70–99)

## 2018-05-04 NOTE — Progress Notes (Signed)
Subjective:  Pain is better. No further hiccoughs. No fever.   Objective:  Vital Signs in the last 24 hours: Temp:  [98.4 F (36.9 C)-98.9 F (37.2 C)] 98.9 F (37.2 C) (11/10 0447) Pulse Rate:  [68-78] 68 (11/10 0447) Resp:  [23] 23 (11/09 2210) BP: (118-155)/(66-87) 122/82 (11/10 0447) SpO2:  [95 %-100 %] 95 % (11/10 0447) Weight:  [116.8 kg] 116.8 kg (11/10 0447)  Intake/Output from previous day: 11/09 0701 - 11/10 0700 In: 720 [P.O.:720] Out: 1825 [Urine:1825]  Physical Exam: Blood pressure 122/82, pulse 68, temperature 98.9 F (37.2 C), temperature source Oral, resp. rate (!) 23, height 6\' 2"  (1.88 m), weight 116.8 kg, SpO2 95 %.   Constitutional: He is oriented to person, place, and time. He appears well-developed.  Cardiovascular: Normal rate, regular rhythm and normal heart sounds.  Right groin without hematoma. Absent bilateral pedal pulse. Left foot now is warm.   Pulmonary/Chest: Effort normal and breath sounds normal.  Abdominal: Soft. Bowel sounds are normal.  Musculoskeletal: He exhibits edema (2 plus bilaterl left > right).  Neurological: He is oriented to person, place, and time.  Skin: Skin is warm.  Lab Results: BMP Recent Labs    04/29/18 0727 04/30/18 0717  NA 134* 135  K 3.5 4.5  CL 102 107  CO2 24 20*  GLUCOSE 103* 223*  BUN 21 18  CREATININE 1.77* 1.53*  CALCIUM 8.5* 8.3*  GFRNONAA 38* 45*  GFRAA 44* 53*    CBC Recent Labs  Lab 05/03/18 0253  WBC 11.7*  RBC 3.15*  HGB 8.1*  HCT 25.3*  PLT 250  MCV 80.3  MCH 25.7*  MCHC 32.0  RDW 16.3*    HEMOGLOBIN A1C Lab Results  Component Value Date   HGBA1C 8.5 03/15/2016   MPG 229 (H) 12/02/2012   Lipid Panel     Component Value Date/Time   LDLCALC 115 03/15/2016   Scheduled Meds: . allopurinol  100 mg Oral Daily  . amoxicillin-clavulanate  1 tablet Oral Q12H  . aspirin EC  81 mg Oral Daily  . carvedilol  12.5 mg Oral Q1400  . Chlorhexidine Gluconate Cloth  6 each Topical  Q0600  . colchicine  0.6 mg Oral BID  . dorzolamide-timolol  1 drop Both Eyes BID  . insulin aspart  0-15 Units Subcutaneous TID WC  . insulin glargine  15 Units Subcutaneous Q24H  . l-methylfolate-B6-B12  1 tablet Oral BID  . losartan  100 mg Oral Q1400  . mupirocin ointment  1 application Nasal BID  . pravastatin  40 mg Oral QHS  . pregabalin  50 mg Oral QHS  . rivaroxaban  15 mg Oral BID WC   Followed by  . [START ON 05/23/2018] rivaroxaban  20 mg Oral Q supper  . sodium chloride flush  3 mL Intravenous Q12H  . sodium chloride flush  3 mL Intravenous Q12H  . triamterene-hydrochlorothiazide  1 tablet Oral Q1400   Continuous Infusions: . sodium chloride    . sodium chloride     PRN Meds:.sodium chloride, sodium chloride, acetaminophen, labetalol, magnesium hydroxide, morphine injection, ondansetron (ZOFRAN) IV, oxyCODONE-acetaminophen **AND** oxyCODONE, sodium chloride flush, sodium chloride flush   Assessment/Plan:  1. Critical limb ischemia, left leg. 04/30/2018: PTA and balloon angioplasty of the occluded left mid anterior tibial artery, thrombotic occlusion, 100% to 0% with brisk flow at the level of the ankle, it reconstitutes the posterior tibial artery.  Thrombectomy, aspiration of the peroneal artery, thrombotic 100% reduced to less than 10%.  Brisk flow was evident in both the anterior tibial and peroneal arteries at the end of the procedure.  The popliteal artery had organized thrombus with a diffuse 50% stenosis. The Viabahn stent placed in the left distal SFA, proximal popliteal artery remains occluded.   ABI 05/02/18: Right: Resting right ankle-brachial index indicates mild right lower extremity arterial disease. The right toe-brachial index is normal.   Left: Resting left ankle-brachial index indicates severe left lower extremity arterial disease. The left toe-brachial index is abnormal.     RIGHT    LEFT    PRESSURE WAVEFORM  PRESSURE WAVEFORM  BRACHIAL  131 Triphasic BRACHIAL 132 Triphasic  DP 108 Triphasic DP 52 Dampened monophasic  PT 98 Triphasic PT 57 Dampened monophasic  GREAT TOE 108  GREAT TOE 0 Absent    RIGHT LEFT  ABI/TBI 0.82/0.82 0.43/0.00    2.  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 Renal function stable 3.  Hypertension. Will allow permissive hypertension 4. Blood loss anemia. No overt bleeding. 5. Fever with chills resolved but still has low grade temperature.    Rec: Presently stable from vascular standpoint. Will have PMNR see in AM.  Consult placed. If he is able to walk today and be relatively independent, can be discharged home with home PT as well. Yesterday, it was difficult and he has not walked today. D/W RN Adrian Prows, M.D. 05/04/2018, 12:39 PM [facility] [place] Pager: [phone] Office: [phone] If no answer: [phone]

## 2018-05-04 NOTE — Progress Notes (Signed)
RN offered to ambulate pt several times throughout shift. Pt declined and wanted to wait for his wife. Will try again later.  Clyde Canterbury, RN

## 2018-05-05 ENCOUNTER — Encounter (HOSPITAL_COMMUNITY): Payer: Self-pay | Admitting: Physical Medicine and Rehabilitation

## 2018-05-05 DIAGNOSIS — Z959 Presence of cardiac and vascular implant and graft, unspecified: Secondary | ICD-10-CM

## 2018-05-05 DIAGNOSIS — D72829 Elevated white blood cell count, unspecified: Secondary | ICD-10-CM

## 2018-05-05 DIAGNOSIS — R0682 Tachypnea, not elsewhere classified: Secondary | ICD-10-CM

## 2018-05-05 DIAGNOSIS — I10 Essential (primary) hypertension: Secondary | ICD-10-CM

## 2018-05-05 DIAGNOSIS — E669 Obesity, unspecified: Secondary | ICD-10-CM

## 2018-05-05 DIAGNOSIS — E1169 Type 2 diabetes mellitus with other specified complication: Secondary | ICD-10-CM

## 2018-05-05 DIAGNOSIS — D62 Acute posthemorrhagic anemia: Secondary | ICD-10-CM

## 2018-05-05 DIAGNOSIS — I998 Other disorder of circulatory system: Secondary | ICD-10-CM

## 2018-05-05 LAB — GLUCOSE, CAPILLARY
Glucose-Capillary: 247 mg/dL — ABNORMAL HIGH (ref 70–99)
Glucose-Capillary: 239 mg/dL — ABNORMAL HIGH (ref 70–99)
Glucose-Capillary: 326 mg/dL — ABNORMAL HIGH (ref 70–99)
Glucose-Capillary: 247 mg/dL — ABNORMAL HIGH (ref 70–99)
Glucose-Capillary: 306 mg/dL — ABNORMAL HIGH (ref 70–99)

## 2018-05-05 MED ORDER — PSYLLIUM 95 % PO PACK
1.0000 | PACK | Freq: Every day | ORAL | Status: DC
  Administered 2018-05-05 – 2018-05-08 (×3): 1 via ORAL
  Filled 2018-05-05 (×4): qty 1

## 2018-05-05 MED ORDER — TRAMADOL HCL 50 MG PO TABS
50.0000 mg | ORAL_TABLET | Freq: Four times a day (QID) | ORAL | Status: DC | PRN
  Administered 2018-05-05 – 2018-05-08 (×7): 50 mg via ORAL
  Filled 2018-05-05 (×7): qty 1

## 2018-05-05 NOTE — Progress Notes (Signed)
Subjective:  No new complaints.  Has not been able to get out of the bed.  Still has pain on weightbearing.  Objective:  Vital Signs in the last 24 hours: Temp:  [99 F (37.2 C)-99.9 F (37.7 C)] 99 F (37.2 C) (11/11 0527) Pulse Rate:  [67-71] 67 (11/11 0527) Resp:  [20-26] 21 (11/11 0527) BP: (127-162)/(73-88) 131/83 (11/11 0527) SpO2:  [93 %-100 %] 94 % (11/11 0527) Weight:  [114.8 kg] 114.8 kg (11/11 0527)  Intake/Output from previous day: 11/10 0701 - 11/11 0700 In: 960 [P.O.:960] Out: 2205 [Urine:2205]  Physical Exam:  Blood pressure 126/86, pulse 67, temperature 99.1 F (37.3 C), temperature source Oral, resp. rate (!) 23, height 6\' 2"  (1.88 m), weight 114.8 kg, SpO2 95 %. Body mass index is 32.49 kg/m.  General appearance: alert, cooperative, no distress and mildly obese Eyes: negative findings: lids and lashes normal Neck: no adenopathy, no carotid bruit, no JVD and supple, symmetrical, trachea midline Neck: JVP - normal, carotids 2+= without bruits Resp: clear to auscultation bilaterally Chest wall: no tenderness Cardio: regular rate and rhythm, S1, S2 normal, no murmur, click, rub or gallop GI: soft, non-tender; bowel sounds normal; no masses,  no organomegaly Extremities: Bilateral 2+ pitting edema.  Both the legs are warm.  No skin breakdown.  Absent pedal pulses.    Lab Results: BMP Recent Labs    04/29/18 0727 04/30/18 0717  NA 134* 135  K 3.5 4.5  CL 102 107  CO2 24 20*  GLUCOSE 103* 223*  BUN 21 18  CREATININE 1.77* 1.53*  CALCIUM 8.5* 8.3*  GFRNONAA 38* 45*  GFRAA 44* 53*    CBC Recent Labs  Lab 05/03/18 0253  WBC 11.7*  RBC 3.15*  HGB 8.1*  HCT 25.3*  PLT 250  MCV 80.3  MCH 25.7*  MCHC 32.0  RDW 16.3*    Assessment/Plan:  1. Critical limb ischemia, left leg. 04/30/2018: PTA and balloon angioplasty of the occluded left mid anterior tibial artery, thrombotic occlusion, 100% to 0% with brisk flow at the level of the ankle, it  reconstitutes the posterior tibial artery. Thrombectomy, aspiration of the peroneal artery, thrombotic 100% reduced to less than 10%. Brisk flow was evident in both the anterior tibial and peroneal arteries at the end of the procedure. The popliteal artery had organized thrombus with a diffuse 50% stenosis. The Viabahn stent placed in the left distal SFA, proximal popliteal artery remains occluded.   ABI 05/02/18: Right: Resting right ankle-brachial index indicates mild right lower extremity arterial disease. The right toe-brachial index is normal.   Left: Resting left ankle-brachial index indicates severe left lower extremity arterial disease. The left toe-brachial index is abnormal.    RIGHT    LEFT    PRESSURE WAVEFORM  PRESSURE WAVEFORM  BRACHIAL 131 Triphasic BRACHIAL 132 Triphasic  DP 108 Triphasic DP 52 Dampened monophasic  PT 98 Triphasic PT 57 Dampened monophasic  GREAT TOE 108  GREAT TOE 0 Absent    RIGHT LEFT  ABI/TBI 0.82/0.82 0.43/0.00    2. 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 Renal function stable 3. Hypertension. Controlled 4. Blood loss anemia. No overt bleeding. 5. Fever resolved 6.  Constipation  Rec: From vascular standpoint is stable.  We just had to give him time to recover.  PMNR consultation is awaiting.  He is now on oral antibiotics.  Continue Xarelto full dose for anticoagulation.  Disposition depends on PMNR evaluation.  Patient would like to  try tramadol for pain instead of morphine sulfate, I have started him on tramadol.  Also placed him on Metamucil for constipation.   Adrian Prows, M.D. 05/05/2018, 8:05 AM [facility] Pager: [phone] Office: [phone] If no answer: [phone]

## 2018-05-05 NOTE — Progress Notes (Signed)
Physical Therapy Treatment Patient Details Name: [name] MRN: [id number] DOB: 1950-08-27 Today's Date: 05/05/2018    History of Present Illness 67yo male with ongoing pain due to peripheral artery disease and with abnormal ABI. He received peripheral vascular cath on 10/29, 11/5, 11/6, and lysis recheck on 11/6, also thrombectomy on 11/7. PMH DM, distant history of TB, HTN, diabetic neuropathy, multiple vascular surgery interventions     PT Comments    Pt continues to demonstrate self-limiting and anxious behavior hindering his mobility progress. Encouragement needed throughout session to participate in mobility/ambulation. Pt initially reported decreased LLE but began verbalizing inability to walk due to LLE pain as session progressed. He required mod assist sit to stand and min assist +2 safety ambulation 10 feet with RW. Pt on BSC on end of session with call button. RN aware. Pt encouraged to sit in recliner when finished on BSC. PT to continue per POC.     Follow Up Recommendations  SNF     Equipment Recommendations  Rolling walker with 5" wheels;3in1 (PT);Wheelchair (measurements PT);Wheelchair cushion (measurements PT)    Recommendations for Other Services       Precautions / Restrictions Precautions Precautions: Fall Restrictions Other Position/Activity Restrictions: self-imposed NWB L LE due to pain     Mobility  Bed Mobility Overal bed mobility: Needs Assistance Bed Mobility: Supine to Sit     Supine to sit: Supervision;HOB elevated     General bed mobility comments: +rail, increased time and effort  Transfers Overall transfer level: Needs assistance Equipment used: Rolling walker (2 wheeled) Transfers: Sit to/from Stand Sit to Stand: Mod assist;From elevated surface         General transfer comment: cues for hand placment, assist to power up, sit to stand x 3 trials  Ambulation/Gait Ambulation/Gait assistance: +2 safety/equipment;Min assist Gait  Distance (Feet): 10 Feet Assistive device: Rolling walker (2 wheeled) Gait Pattern/deviations: Step-to pattern;Decreased stride length;Antalgic;Decreased weight shift to left Gait velocity: decreased Gait velocity interpretation: <1.8 ft/sec, indicate of risk for recurrent falls General Gait Details: Pt fatigues quickly. Heavy reliance on RW due to maintain self-imposed NWB LLE. Cues needed for sequencing   Stairs             Wheelchair Mobility    Modified Rankin (Stroke Patients Only)       Balance Overall balance assessment: Needs assistance Sitting-balance support: No upper extremity supported;Feet supported Sitting balance-Leahy Scale: Good     Standing balance support: Bilateral upper extremity supported;During functional activity Standing balance-Leahy Scale: Poor Standing balance comment: heavy reliance on RW                            Cognition Arousal/Alertness: Awake/alert Behavior During Therapy: Anxious Overall Cognitive Status: Within Functional Limits for tasks assessed                                        Exercises      General Comments        Pertinent Vitals/Pain Pain Assessment: Faces Faces Pain Scale: Hurts even more Pain Location: LLE Pain Descriptors / Indicators: Guarding;Spasm;Aching Pain Intervention(s): Monitored during session;Repositioned    Home Living                      Prior Function  PT Goals (current goals can now be found in the care plan section) Acute Rehab PT Goals Patient Stated Goal: decrease pain PT Goal Formulation: With patient Time For Goal Achievement: 05/16/18 Potential to Achieve Goals: Fair    Frequency    Min 3X/week      PT Plan      Co-evaluation              AM-PAC PT "6 Clicks" Daily Activity  Outcome Measure  Difficulty turning over in bed (including adjusting bedclothes, sheets and blankets)?: A Little Difficulty moving from  lying on back to sitting on the side of the bed? : A Little Difficulty sitting down on and standing up from a chair with arms (e.g., wheelchair, bedside commode, etc,.)?: A Lot Help needed moving to and from a bed to chair (including a wheelchair)?: A Little Help needed walking in hospital room?: A Lot Help needed climbing 3-5 steps with a railing? : Total 6 Click Score: 14    End of Session Equipment Utilized During Treatment: Gait belt Activity Tolerance: Patient limited by pain Patient left: Other (comment);with call bell/phone within reach Nurse Communication: Mobility status;Other (comment)(Pt on [facility] PT Visit Diagnosis: Unsteadiness on feet (R26.81);Difficulty in walking, not elsewhere classified (R26.2);Pain;Muscle weakness (generalized) (M62.81);Other abnormalities of gait and mobility (R26.89) Pain - Right/Left: Left Pain - part of body: Leg     Time: 6270-3500 PT Time Calculation (min) (ACUTE ONLY): 25 min  Charges:  $Gait Training: 8-22 mins $Therapeutic Activity: 8-22 mins                     Lorrin Goodell, PT  Office # [phone] Pager [phone]    Lorriane Shire 05/05/2018, 10:27 AM
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
## 2018-05-05 NOTE — Clinical Social Work Note (Signed)
Clinical Social Work Assessment  Patient Details  Name: [name] MRN: [id number] Date of Birth: 12/19/1950  Date of referral:  05/05/18               Reason for consult:  Facility Placement                Permission sought to share information with:  Facility Sport and exercise psychologist, Family Supports Permission granted to share information::  Yes, Verbal Permission Granted  Name::     Engineer, manufacturing::  SNF  Relationship::  Wife  Contact Information:     Housing/Transportation Living arrangements for the past 2 months:  Single Family Home Source of Information:  Patient Patient Interpreter Needed:  None Criminal Activity/Legal Involvement Pertinent to Current Situation/Hospitalization:  No - Comment as needed Significant Relationships:  Spouse Lives with:  Self, Spouse Do you feel safe going back to the place where you live?  Yes Need for family participation in patient care:  No (Coment)  Care giving concerns:  Patient from home with spouse but will benefit from short term rehab at discharge.   Social Worker assessment / plan:  CSW met with patient to discuss recommendation for SNF. Patient agreeable, prefers [facility] if possible. Patient provided permission for CSW to contact wife to discuss other offers, if necessary. CSW faxed out referral, awaiting offers. CSW to follow.  Employment status:  Therapist, music:  Managed Care PT Recommendations:  [place] / Referral to community resources:  [place]  Patient/Family's Response to care:  Patient agreeable to SNF placement.  Patient/Family's Understanding of and Emotional Response to Diagnosis, Current Treatment, and Prognosis:  Patient acknowledged that rehab would be beneficial, he needed to get stronger. Patient says he's aware of [facility] because his mother in law had gone there for rehab and it had gone very well. Patient says his wife may be familiar with  other facilities, as well, because she was more involved when her mother needed extra care.  Emotional Assessment Appearance:  Appears stated age Attitude/Demeanor/Rapport:  Engaged Affect (typically observed):  Pleasant Orientation:  Oriented to Self, Oriented to Place, Oriented to  Time, Oriented to Situation Alcohol / Substance use:  Not Applicable Psych involvement (Current and /or in the community):  No (Comment)  Discharge Needs  Concerns to be addressed:  Care Coordination Readmission within the last 30 days:  No Current discharge risk:  Dependent with Mobility, Physical Impairment Barriers to Discharge:  Continued Medical Work up, [place], [place] 05/05/2018, 3:37 PM

## 2018-05-05 NOTE — NC FL2 (Signed)
[place] LEVEL OF CARE SCREENING TOOL     IDENTIFICATION  Patient Name: [name] Birthdate: 08-Feb-1951 Sex: male Admission Date (Current Location): 04/28/2018  [facility] [place] [facility] [place] Number:  Herbalist and Address:  [facility] [place], [place] [address], [place], [place] [address]      Provider Number: [id number]  Attending Physician Name and Address:  Adrian Prows, MD  Relative Name and Phone Number:       Current Level of Care: Hospital Recommended Level of Care: [place] Prior Approval Number:    Date Approved/Denied:   PASRR Number: [id number]  Discharge Plan: SNF    Current Diagnoses: Patient Active Problem List   Diagnosis Date Noted  . Benign essential HTN   . Diabetes mellitus type 2 in obese ([place])   . Acute blood loss anemia   . Tachypnea   . Leukocytosis   . Critical limb ischemia with history of revascularization of same extremity 04/28/2018  . Critical lower limb ischemia 04/28/2018  . Claudication in peripheral vascular disease ([place]) 08/04/2017  . DM (diabetes mellitus) ([place]) 12/02/2012  . HTN (hypertension) 12/02/2012  . Chronic kidney disease 12/02/2012    Orientation RESPIRATION BLADDER Height & Weight     Self, Time, Situation, Place  Normal Continent Weight: 253 lb 1.4 oz (114.8 kg) Height:  6\' 2"  (188 cm)  BEHAVIORAL SYMPTOMS/MOOD NEUROLOGICAL BOWEL NUTRITION STATUS      Continent Diet(see DC summary)  AMBULATORY STATUS COMMUNICATION OF NEEDS Skin   Extensive Assist Verbally Normal                       Personal Care Assistance Level of Assistance  Bathing, Feeding, Dressing Bathing Assistance: Limited assistance Feeding assistance: Independent Dressing Assistance: Limited assistance     Functional Limitations Info  Sight, Hearing, Speech Sight Info: Impaired(wears glasses) Hearing Info: Adequate Speech Info: Adequate    SPECIAL CARE FACTORS FREQUENCY  PT (By licensed  PT), OT (By licensed OT)     PT Frequency: 5x/wk OT Frequency: 5x/wk            Contractures Contractures Info: Not present    Additional Factors Info  Code Status, Allergies, Insulin Sliding Scale Code Status Info: Full Allergies Info: NKA   Insulin Sliding Scale Info: Novolog 3x/day with meals; Lantus 15 units daily       Current Medications (05/05/2018):  This is the current hospital active medication list Current Facility-Administered Medications  Medication Dose Route Frequency Provider Last Rate Last Dose  . 0.9 %  sodium chloride infusion  250 mL Intravenous PRN Adrian Prows, MD      . 0.9 %  sodium chloride infusion  250 mL Intravenous PRN Adrian Prows, MD      . acetaminophen (TYLENOL) tablet 650 mg  650 mg Oral Q4H PRN Adrian Prows, MD   650 mg at 04/30/18 0811  . allopurinol (ZYLOPRIM) tablet 100 mg  100 mg Oral Daily Adrian Prows, MD   100 mg at 05/05/18 0844  . amoxicillin-clavulanate (AUGMENTIN) 875-125 MG per tablet 1 tablet  1 tablet Oral Q12H Adrian Prows, MD   1 tablet at 05/05/18 0844  . aspirin EC tablet 81 mg  81 mg Oral Daily Adrian Prows, MD   81 mg at 05/05/18 0843  . carvedilol (COREG) tablet 12.5 mg  12.5 mg Oral Q1400 Adrian Prows, MD   12.5 mg at 05/05/18 1500  . colchicine tablet 0.6 mg  0.6 mg Oral BID Adrian Prows, MD   0.6 mg at 05/05/18 0843  . dorzolamide-timolol (COSOPT) 22.3-6.8 MG/ML ophthalmic solution 1 drop  1 drop Both Eyes BID Adrian Prows, MD   1 drop at 05/05/18 1252  . insulin aspart (novoLOG) injection 0-15 Units  0-15 Units Subcutaneous TID WC Adrian Prows, MD   11 Units at 05/05/18 1250  . insulin glargine (LANTUS) injection 15 Units  15 Units Subcutaneous Q24H Adrian Prows, MD   15 Units at 05/05/18 1250  . l-methylfolate-B6-B12 (METANX) 3-35-2 MG per tablet 1 tablet  1 tablet Oral BID Adrian Prows, MD   1 tablet at 05/05/18 0844  . labetalol (NORMODYNE,TRANDATE) injection 10 mg  10 mg Intravenous Q10 min PRN Adrian Prows, MD   10 mg at 04/30/18 1107  .  losartan (COZAAR) tablet 100 mg  100 mg Oral Q1400 Adrian Prows, MD   100 mg at 05/05/18 1501  . magnesium hydroxide (MILK OF MAGNESIA) suspension 15 mL  15 mL Oral Daily PRN Adrian Prows, MD   15 mL at 05/03/18 1530  . morphine 4 MG/ML injection 4 mg  4 mg Intravenous Q2H PRN Adrian Prows, MD   4 mg at 05/04/18 2026  . ondansetron (ZOFRAN) injection 4 mg  4 mg Intravenous Q6H PRN Adrian Prows, MD   4 mg at 04/29/18 2204  . oxyCODONE-acetaminophen (PERCOCET/ROXICET) 5-325 MG per tablet 1 tablet  1 tablet Oral Q4H PRN Adrian Prows, MD   1 tablet at 05/03/18 1529   And  . oxyCODONE (Oxy IR/ROXICODONE) immediate release tablet 5 mg  5 mg Oral Q4H PRN Adrian Prows, MD   5 mg at 05/03/18 2206  . pravastatin (PRAVACHOL) tablet 40 mg  40 mg Oral QHS Adrian Prows, MD   40 mg at 05/04/18 2026  . pregabalin (LYRICA) capsule 50 mg  50 mg Oral QHS Adrian Prows, MD   50 mg at 05/04/18 2026  . psyllium (HYDROCIL/METAMUCIL) packet 1 packet  1 packet Oral QHS Adrian Prows, MD      . Rivaroxaban (XARELTO) tablet 15 mg  15 mg Oral BID WC Adrian Prows, MD   15 mg at 05/05/18 0844   Followed by  . [START ON 05/23/2018] rivaroxaban (XARELTO) tablet 20 mg  20 mg Oral Q supper Adrian Prows, MD      . sodium chloride flush (NS) 0.9 % injection 3 mL  3 mL Intravenous Q12H Adrian Prows, MD   3 mL at 05/05/18 1106  . sodium chloride flush (NS) 0.9 % injection 3 mL  3 mL Intravenous PRN Adrian Prows, MD      . sodium chloride flush (NS) 0.9 % injection 3 mL  3 mL Intravenous Q12H Adrian Prows, MD   3 mL at 05/05/18 1106  . sodium chloride flush (NS) 0.9 % injection 3 mL  3 mL Intravenous PRN Adrian Prows, MD      . traMADol Veatrice Bourbon) tablet 50 mg  50 mg Oral Q6H PRN Adrian Prows, MD   50 mg at 05/05/18 0858  . triamterene-hydrochlorothiazide (MAXZIDE-25) 37.5-25 MG per tablet 1 tablet  1 tablet Oral Q1400 Adrian Prows, MD   1 tablet at 05/05/18 1501     Discharge Medications: Please see discharge summary for a list of discharge medications.  Relevant  Imaging Results:  Relevant Lab Results:   Additional Information SS#: [id number]  Geralynn Ochs, LCSW

## 2018-05-05 NOTE — Progress Notes (Signed)
Inpatient Diabetes Program Recommendations  AACE/ADA: New Consensus Statement on Inpatient Glycemic Control (2019)  Target Ranges:  Prepandial:   less than 140 mg/dL      Peak postprandial:   less than 180 mg/dL (1-2 hours)      Critically ill patients:  140 - 180 mg/dL   Results for [name], [name] (MRN [id number]) as of 05/05/2018 10:44  Ref. Range 05/04/2018 07:15 05/04/2018 11:23 05/04/2018 16:42 05/04/2018 20:31 05/05/2018 06:48  Glucose-Capillary Latest Ref Range: 70 - 99 mg/dL 278 (H) 294 (H) 260 (H) 249 (H) 239 (H)   Review of Glycemic Control  Diabetes history: DM2 Outpatient Diabetes medications: Insulin Pump (V-Go 30 - provides a total of 30 units for basal insulin/24 hours and uses clicks on pump for meal coverage), Metformin 1000 mg BID, Victoza 1.2 mg daily Current orders for Inpatient glycemic control: Novolog 0-15 units Q4H, Lantus 15 units Q24H  Inpatient Diabetes Program Recommendations:  Insulin - Basal: Please consider increasing Lantus to 20 units Q24H (starting 11/12) for basal insulin. If MD agreeable with increasing Lantus, please order one time Lantus 5 units x1 now (for total of 20 units today since patient received Lantus 15 units this morning).  Insulin-Meal Coverage: Please consider ordering Novolog 3 units TID with meals for meal coverage if patient eats at least 50% of meals.  Thanks, Tama Headings RN, MSN, BC-ADM Inpatient Diabetes Coordinator Team Pager [phone] (8a-5p)

## 2018-05-06 LAB — GLUCOSE, CAPILLARY
Glucose-Capillary: 273 mg/dL — ABNORMAL HIGH (ref 70–99)
Glucose-Capillary: 318 mg/dL — ABNORMAL HIGH (ref 70–99)
Glucose-Capillary: 245 mg/dL — ABNORMAL HIGH (ref 70–99)
Glucose-Capillary: 278 mg/dL — ABNORMAL HIGH (ref 70–99)

## 2018-05-06 NOTE — Progress Notes (Signed)
IP rehab admissions - Please see rehab consult done by Dr. Posey Pronto recommending outpatient therapy or SNF for less intense therapies.  Noted that social worker is following patient.  I will sign off for acute inpatient rehab at this time.  Call me for questions.  [phone]

## 2018-05-06 NOTE — Progress Notes (Addendum)
CSW called and spoke with patient's wife for follow up with possible SNF choices. Her choices were unavailable ;[facility]( no beds available) and [facility] (out of network). Wife inquired who recommended SNF. CSW explained SNF was recommended by PT.  If patient unable to go requested SNF then he will be discharged home. RNCM informed.   [name], [place] Social Worker [phone]

## 2018-05-06 NOTE — Progress Notes (Signed)
Encouraged patient to walk down the hall, stood at the chair with a walker stating his leg hurts too much to bare weight.

## 2018-05-07 LAB — GLUCOSE, CAPILLARY
Glucose-Capillary: 214 mg/dL — ABNORMAL HIGH (ref 70–99)
Glucose-Capillary: 252 mg/dL — ABNORMAL HIGH (ref 70–99)
Glucose-Capillary: 231 mg/dL — ABNORMAL HIGH (ref 70–99)
Glucose-Capillary: 262 mg/dL — ABNORMAL HIGH (ref 70–99)

## 2018-05-07 NOTE — Progress Notes (Addendum)
Inpatient Diabetes Program Recommendations  AACE/ADA: New Consensus Statement on Inpatient Glycemic Control (2015)  Target Ranges:  Prepandial:   less than 140 mg/dL      Peak postprandial:   less than 180 mg/dL (1-2 hours)      Critically ill patients:  140 - 180 mg/dL   Lab Results  Component Value Date   GLUCAP 252 (H) 05/07/2018   HGBA1C 8.5 03/15/2016    Review of Glycemic Control Results for JOHNCARLOS, HOLTSCLAW (MRN [id number]) as of 05/07/2018 11:24  Ref. Range 05/06/2018 21:35 05/07/2018 06:22 05/07/2018 11:00  Glucose-Capillary Latest Ref Range: 70 - 99 mg/dL 278 (H) 214 (H) 252 (H)   Diabetes history:DM2 Outpatient Diabetes medications:Insulin Pump (V-Go 30 - provides a total of 30 units for basal insulin/24 hours and uses clicks on pump for meal coverage), Metformin 1000 mg BID, Victoza 1.2 mg daily Current orders for Inpatient glycemic control:Novolog 0-15 units Q4H, Lantus 15 units Q24H  Inpatient Diabetes Program Recommendations:  In the event pt to remain inpatient:  Insulin - Basal:Please consider increasing Lantus to 20units Q24H (starting 11/13) for basal insulin.   Insulin-Meal Coverage: Please consider ordering Novolog 3 units TID with meals for meal coverage if patient eats at least 50% of meals.  Thanks, Bronson Curb, MSN, RNC-OB Diabetes Coordinator [phone] (8a-5p)

## 2018-05-07 NOTE — Progress Notes (Signed)
Subjective:  No new complaints.  Has not been able to get out of the bed.  Still has pain on weightbearing.  Does not appear to be motivated to increasing physical activity in spite of nursing encouraging him to walk.  Objective:  Vital Signs in the last 24 hours: Temp:  [97.6 F (36.4 C)-99 F (37.2 C)] 97.6 F (36.4 C) (11/13 0503) Pulse Rate:  [61-68] 61 (11/13 0503) Resp:  [15-25] 15 (11/13 0503) BP: (101-128)/(69-76) 128/76 (11/13 0503) SpO2:  [95 %-100 %] 100 % (11/13 0503)  Intake/Output from previous day: 11/12 0701 - 11/13 0700 In: 400 [P.O.:400] Out: 1200 [Urine:1200]  Physical Exam:  Blood pressure 128/76, pulse 61, temperature 97.6 F (36.4 C), temperature source Oral, resp. rate 15, height 6\' 2"  (1.88 m), weight 114.8 kg, SpO2 100 %. Body mass index is 32.49 kg/m.  General appearance: alert, cooperative, no distress and mildly obese Eyes: negative findings: lids and lashes normal Neck: no adenopathy, no carotid bruit, no JVD and supple, symmetrical, trachea midline Neck: JVP - normal, carotids 2+= without bruits Resp: clear to auscultation bilaterally Chest wall: no tenderness Cardio: regular rate and rhythm, S1, S2 normal, no murmur, click, rub or gallop GI: soft, non-tender; bowel sounds normal; no masses,  no organomegaly Extremities: Bilateral 2+ pitting edema.  Both the legs are warm.  No skin breakdown.  Absent pedal pulses.    Lab Results: BMP Recent Labs    04/29/18 0727 04/30/18 0717  NA 134* 135  K 3.5 4.5  CL 102 107  CO2 24 20*  GLUCOSE 103* 223*  BUN 21 18  CREATININE 1.77* 1.53*  CALCIUM 8.5* 8.3*  GFRNONAA 38* 45*  GFRAA 44* 53*    CBC Recent Labs  Lab 05/03/18 0253  WBC 11.7*  RBC 3.15*  HGB 8.1*  HCT 25.3*  PLT 250  MCV 80.3  MCH 25.7*  MCHC 32.0  RDW 16.3*    Scheduled Meds: . allopurinol  100 mg Oral Daily  . aspirin EC  81 mg Oral Daily  . carvedilol  12.5 mg Oral Q1400  . colchicine  0.6 mg Oral BID  .  dorzolamide-timolol  1 drop Both Eyes BID  . insulin aspart  0-15 Units Subcutaneous TID WC  . insulin glargine  15 Units Subcutaneous Q24H  . l-methylfolate-B6-B12  1 tablet Oral BID  . losartan  100 mg Oral Q1400  . pravastatin  40 mg Oral QHS  . pregabalin  50 mg Oral QHS  . psyllium  1 packet Oral QHS  . rivaroxaban  15 mg Oral BID WC   Followed by  . [START ON 05/23/2018] rivaroxaban  20 mg Oral Q supper  . triamterene-hydrochlorothiazide  1 tablet Oral Q1400   Continuous Infusions: . sodium chloride     PRN Meds:.sodium chloride, acetaminophen, labetalol, magnesium hydroxide, morphine injection, ondansetron (ZOFRAN) IV, oxyCODONE-acetaminophen **AND** oxyCODONE, sodium chloride flush, traMADol   Assessment/Plan:  1. Critical limb ischemia, left leg.  Non-limb threatening 04/30/2018: PTA and balloon angioplasty of the occluded left mid anterior tibial artery, thrombotic occlusion, 100% to 0% with brisk flow at the level of the ankle, it reconstitutes the posterior tibial artery. Thrombectomy, aspiration of the peroneal artery, thrombotic 100% reduced to less than 10%. Brisk flow was evident in both the anterior tibial and peroneal arteries at the end of the procedure. The popliteal artery had organized thrombus with a diffuse 50% stenosis. The Viabahn stent placed in the left distal SFA, proximal popliteal artery remains occluded.  ABI 05/02/18: Right: Resting right ankle-brachial index indicates mild right lower extremity arterial disease. The right toe-brachial index is normal.   Left: Resting left ankle-brachial index indicates severe left lower extremity arterial disease. The left toe-brachial index is abnormal.    RIGHT    LEFT    PRESSURE WAVEFORM  PRESSURE WAVEFORM  BRACHIAL 131 Triphasic BRACHIAL 132 Triphasic  DP 108 Triphasic DP 52 Dampened monophasic  PT 98 Triphasic PT 57 Dampened monophasic  GREAT TOE 108  GREAT TOE 0 Absent    RIGHT LEFT   ABI/TBI 0.82/0.82 0.43/0.00    2. 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  3. Hypertension. Controlled   Rec: From vascular standpoint is stable.   I have discussed extensively with the patient and his wife over the telephone, that from cardiac standpoint he can be discharged home or to a skilled nursing facility where he can undergo rehab.  He does not appear to be motivated and increasing his physical activity.  Pain medications have been prescribed but I am not sure whether he is taking them.  His wife will be in touch with [place] today and he will be discharged to a skilled nursing facility approved by the insurance or he will be discharged home with home physical therapy.  Adrian Prows, M.D. 05/07/2018, 8:39 AM [facility] Pager: [phone] Office: [phone] If no answer: [phone]

## 2018-05-07 NOTE — Progress Notes (Signed)
Pt called out r/t concerns about his L 5th toe. Pt and wife noticed blue/purple coloration on tip of L 5th toe that was not there this AM. Pt denies rest pain in foot. DP pulse is dopplerable. Marked color change and will continue to closely monitor pt's foot.

## 2018-05-07 NOTE — Progress Notes (Signed)
CSW spoke with the patient's wife- the patient and his family has decided to go home with home health. RNCM informed.   CSW signing off for now as social work intervention is no longer needed.   [name], [place] Social Worker [phone]

## 2018-05-07 NOTE — Progress Notes (Signed)
Physical Therapy Treatment Patient Details Name: [name] MRN: [id number] DOB: January 14, 1951 Today's Date: 05/07/2018    History of Present Illness 67yo male with ongoing pain due to peripheral artery disease and with abnormal ABI. He received peripheral vascular cath on 10/29, 11/5, 11/6, and lysis recheck on 11/6, also thrombectomy on 11/7. PMH DM, distant history of TB, HTN, diabetic neuropathy, multiple vascular surgery interventions     PT Comments    Patient progressing very slowly towards his physical therapy goals. Upon entering, pt asking for crutches, however, highly recommended pt use of walker versus crutches due to significant high fall risk. Pt still placing heavy reliance through BUE's on walker and displays poor static and dynamic balance. Pt does not display readiness to ambulate with crutches secondary to deficits listed above. In fact, PT recommended pt be at the wheelchair level for mobility at home. Pt continues to self impose LLE NWB despite extensive education on increasing weightbearing and maximal cueing for sequencing. Currently hopping 10 feet with walker and minimal assistance. HR 67-102 HR. Will progress as tolerated.    SNF (pt refusing; will need HHPT)     Equipment Recommendations    None recommended (pt states he has RW, wheelchair)   Recommendations for Other Services       Precautions / Restrictions Precautions Precautions: Fall Restrictions Weight Bearing Restrictions: No RLE Weight Bearing: Weight bearing as tolerated    Mobility  Bed Mobility Overal bed mobility: Needs Assistance Bed Mobility: Supine to Sit;Sit to Supine     Supine to sit: Supervision Sit to supine: Supervision   General bed mobility comments: Increased time and effort but no physical assistance required  Transfers Overall transfer level: Needs assistance Equipment used: Rolling walker (2 wheeled) Transfers: Sit to/from Stand Sit to Stand: Mod assist          General transfer comment: Mod assist from elevated surface  Ambulation/Gait Ambulation/Gait assistance: Min assist Gait Distance (Feet): 10 Feet Assistive device: Rolling walker (2 wheeled) Gait Pattern/deviations: Step-to pattern;Decreased stride length;Antalgic;Decreased weight shift to left Gait velocity: decreased Gait velocity interpretation: <1.8 ft/sec, indicate of risk for recurrent falls General Gait Details: Heavy reliance through BUE's on walker due to tendency for self imposed LLE NWB. Fatigues quickly. Max verbal cueing for sequencing and increasing LLE weightbearing as tolerated. Walker height adjusted.   Stairs             Wheelchair Mobility    Modified Rankin (Stroke Patients Only)       Balance Overall balance assessment: Needs assistance Sitting-balance support: No upper extremity supported;Feet supported Sitting balance-Leahy Scale: Good     Standing balance support: Bilateral upper extremity supported;During functional activity Standing balance-Leahy Scale: Poor Standing balance comment: Heavy reliance on walker. Leaning anteriorly against sink when washing his hands                            Cognition Arousal/Alertness: Awake/alert Behavior During Therapy: Anxious Overall Cognitive Status: Impaired/Different from baseline Area of Impairment: Problem solving;Safety/judgement                         Safety/Judgement: Decreased awareness of safety;Decreased awareness of deficits   Problem Solving: Requires verbal cues;Difficulty sequencing General Comments: Poor problem solving skills and safety awareness      Exercises      General Comments        Pertinent Vitals/Pain Pain Assessment: Faces Faces  Pain Scale: Hurts even more Pain Location: LLE Pain Descriptors / Indicators: Guarding;Aching Pain Intervention(s): Limited activity within patient's tolerance;Monitored during session    Home Living                       Prior Function            PT Goals (current goals can now be found in the care plan section) Acute Rehab PT Goals Patient Stated Goal: decrease pain Potential to Achieve Goals: Fair Progress towards PT goals: Progressing toward goals    Frequency    Min 3X/week      PT Plan Current plan remains appropriate    Co-evaluation              AM-PAC PT "6 Clicks" Daily Activity  Outcome Measure  Difficulty turning over in bed (including adjusting bedclothes, sheets and blankets)?: A Little Difficulty moving from lying on back to sitting on the side of the bed? : A Little Difficulty sitting down on and standing up from a chair with arms (e.g., wheelchair, bedside commode, etc,.)?: A Lot Help needed moving to and from a bed to chair (including a wheelchair)?: A Little Help needed walking in hospital room?: A Little Help needed climbing 3-5 steps with a railing? : A Lot 6 Click Score: 16    End of Session Equipment Utilized During Treatment: Gait belt Activity Tolerance: Patient tolerated treatment well Patient left: in bed;with call bell/phone within reach Nurse Communication: Mobility status PT Visit Diagnosis: Unsteadiness on feet (R26.81);Difficulty in walking, not elsewhere classified (R26.2);Pain;Muscle weakness (generalized) (M62.81);Other abnormalities of gait and mobility (R26.89) Pain - Right/Left: Left Pain - part of body: Leg     Time: 9470-9628 PT Time Calculation (min) (ACUTE ONLY): 32 min  Charges:  $Gait Training: 8-22 mins $Therapeutic Activity: 8-22 mins                     Ellamae Sia, PT, DPT Acute Rehabilitation Services Pager [phone] Office [phone]   Willy Eddy 05/07/2018, 3:13 PM

## 2018-05-07 NOTE — Progress Notes (Signed)
Patient's wife called- informed CSW after further discusson with the patient they have agreed to SNF placement. Their preference is [facility] and [facility] CSW contacted [facility] and is waiting on response.

## 2018-05-08 LAB — CBC
HCT: 25.3 % — ABNORMAL LOW (ref 39.0–52.0)
Hemoglobin: 8 g/dL — ABNORMAL LOW (ref 13.0–17.0)
MCH: 25.2 pg — ABNORMAL LOW (ref 26.0–34.0)
MCHC: 31.6 g/dL (ref 30.0–36.0)
MCV: 79.6 fL — ABNORMAL LOW (ref 80.0–100.0)
Platelets: 440 10*3/uL — ABNORMAL HIGH (ref 150–400)
RBC: 3.18 MIL/uL — ABNORMAL LOW (ref 4.22–5.81)
RDW: 15.7 % — ABNORMAL HIGH (ref 11.5–15.5)
WBC: 9.6 10*3/uL (ref 4.0–10.5)
nRBC: 0 % (ref 0.0–0.2)

## 2018-05-08 LAB — BASIC METABOLIC PANEL
Anion gap: 10 (ref 5–15)
BUN: 31 mg/dL — ABNORMAL HIGH (ref 8–23)
CO2: 21 mmol/L — ABNORMAL LOW (ref 22–32)
Calcium: 8.6 mg/dL — ABNORMAL LOW (ref 8.9–10.3)
Chloride: 93 mmol/L — ABNORMAL LOW (ref 98–111)
Creatinine, Ser: 1.77 mg/dL — ABNORMAL HIGH (ref 0.61–1.24)
GFR calc Af Amer: 44 mL/min — ABNORMAL LOW (ref >60)
GFR calc non Af Amer: 38 mL/min — ABNORMAL LOW (ref >60)
Glucose, Bld: 256 mg/dL — ABNORMAL HIGH (ref 70–99)
Potassium: 4.2 mmol/L (ref 3.5–5.1)
Sodium: 124 mmol/L — ABNORMAL LOW (ref 135–145)

## 2018-05-08 LAB — GLUCOSE, CAPILLARY
Glucose-Capillary: 242 mg/dL — ABNORMAL HIGH (ref 70–99)
Glucose-Capillary: 262 mg/dL — ABNORMAL HIGH (ref 70–99)
Glucose-Capillary: 195 mg/dL — ABNORMAL HIGH (ref 70–99)
Glucose-Capillary: 160 mg/dL — ABNORMAL HIGH (ref 70–99)

## 2018-05-08 NOTE — Progress Notes (Signed)
Results for SANTIEL, TOPPER (MRN [id number]) as of 05/08/2018 14:11  Ref. Range 05/07/2018 11:00 05/07/2018 16:27 05/07/2018 20:12 05/08/2018 06:34 05/08/2018 11:20  Glucose-Capillary Latest Ref Range: 70 - 99 mg/dL 252 (H) 231 (H) 262 (H) 242 (H) 262 (H)  Noted that blood sugars continue to be elevated. Recommend increasing Lantus to 20 units daily, continue Novolog MODERATE correction scale TID & HS, and add Novolog 3 units TID with meals if eating at least 50% of meals.   Harvel Ricks RN BSN CDE Diabetes Coordinator Pager: [phone]  8am-5pm

## 2018-05-08 NOTE — Progress Notes (Signed)
Orthopedic Tech Progress Note Patient Details:  [name] 01-30-1951 [id number]  Patient ID: [name], male   DOB: [birth year], 67 y.o.   MRN: [id number] I went to deliver crutches. Pt had already been given a walker and had decided to use that instead of crutches.  Karolee Stamps 05/08/2018, 9:59 AM

## 2018-05-09 LAB — GLUCOSE, CAPILLARY
Glucose-Capillary: 281 mg/dL — ABNORMAL HIGH (ref 70–99)
Glucose-Capillary: 259 mg/dL — ABNORMAL HIGH (ref 70–99)

## 2018-05-09 MED ORDER — RIVAROXABAN 20 MG PO TABS: 20.0000 mg | ORAL_TABLET | Freq: Every day | ORAL | 2 refills | Status: DC

## 2018-05-09 MED ORDER — RIVAROXABAN 15 MG PO TABS: 15.0000 mg | ORAL_TABLET | Freq: Two times a day (BID) | ORAL | 0 refills | Status: DC

## 2018-05-09 MED ORDER — OXYCODONE-ACETAMINOPHEN 10-325 MG PO TABS: 1.0000 | ORAL_TABLET | ORAL | 0 refills | Status: AC | PRN

## 2018-05-09 MED ORDER — ASPIRIN 81 MG PO TBEC: 81.0000 mg | DELAYED_RELEASE_TABLET | Freq: Every day | ORAL | Status: DC

## 2018-05-09 MED ORDER — TRAMADOL HCL 50 MG PO TABS: 50.0000 mg | ORAL_TABLET | Freq: Four times a day (QID) | ORAL | 0 refills | Status: DC | PRN

## 2018-05-09 MED ORDER — INSULIN GLARGINE 100 UNIT/ML ~~LOC~~ SOLN
20.0000 [IU] | SUBCUTANEOUS | Status: DC
  Administered 2018-05-09: 20 [IU] via SUBCUTANEOUS
  Filled 2018-05-09 (×2): qty 0.2

## 2018-05-09 NOTE — Clinical Social Work Placement (Signed)
   CLINICAL SOCIAL WORK PLACEMENT  NOTE  Date:  05/09/2018  Patient Details  Name: [name] MRN: [id number] Date of Birth: 10-May-1951  Clinical Social Work is seeking post-discharge placement for this patient at the [place] level of care (*CSW will initial, date and re-position this form in  chart as items are completed):  Yes   Patient/family provided with [place] Work Department's list of facilities offering this level of care within the geographic area requested by the patient (or if unable, by the patient's family).  Yes   Patient/family informed of their freedom to choose among providers that offer the needed level of care, that participate in Medicare, Medicaid or managed care program needed by the patient, have an available bed and are willing to accept the patient.  Yes   Patient/family informed of [facility]'s ownership interest in [facility] and [facility], as well as of the fact that they are under no obligation to receive care at these facilities.  PASRR submitted to EDS on 05/08/18     PASRR number received on 05/08/18     Existing PASRR number confirmed on       FL2 transmitted to all facilities in geographic area requested by pt/family on 05/08/18     FL2 transmitted to all facilities within larger geographic area on       Patient informed that his/her managed care company has contracts with or will negotiate with certain facilities, including the following:  [facility]     Yes   Patient/family informed of bed offers received.  Patient chooses bed at [facility] [place] recommends and patient chooses bed at      Patient to be transferred to [facility] [place] on 05/09/18.  Patient to be transferred to facility by       Patient family notified on 05/09/18 of transfer.  Name of family member notified:  [name], spouse     PHYSICIAN       Additional Comment:     _______________________________________________ Estanislado Emms, LCSW 05/09/2018, 12:31 PM

## 2018-05-09 NOTE — Progress Notes (Signed)
Pt being transported to [facility] [place] with all belongings. Report called to receiving nurse, all questions answered. IV and TELE d/c. Pt being transported via [facility].  Amanda Cockayne, RN

## 2018-05-09 NOTE — Discharge Summary (Signed)
Physician Discharge Summary  Patient ID: [name] MRN: [id number] DOB/AGE: [birth year] 67 y.o.  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 Mechanical thrombectomy using Penumbra Cat 6 Kit, PTA and stenting of the left distal SFA with implantation of a 6.0 x 120 mm Innova self-expanding stent, insertion of thrombolytic catheter into the left popliteal artery and left distal SFA and intra arterial infusion of TPA for overnight infusion for thrombotic occlusion of the left distal SFA and popliteal artery viabahn stent. Prolonged procedure with multiple catheters, devices and total of 6 hours in the lab.  1. Thrombotic occlusion of the recently placed Viabahn stent placed on 04/22/2018, S/P mechanical thrombectomy using penumbra  Cat 6 catheter with extraction of large amount of thrombus burden, overlapping proximal segment dissection with 6.0 x 120 mm Innova DES placed proximal to the previously placed Viabahn 5.0x150 mm stent on 04/22/2018, placement of thrombectomy catheter into the left anterior tibial artery and infusion of lytic therapy overnight into the popliteal and distal left SFA. 2.  Patient will be brought back tomorrow for relook angiography. 3. Prolonged fluoroscopy exposure 47.24 with Air  Kerma 954 mGy exposure.   04/30/2018: PTA and balloon angioplasty of the occluded left mid anterior tibial artery, thrombotic occlusion, 100% to 0% with brisk flow at the level of the ankle, it reconstitutes the posterior tibial artery.  Thrombectomy, aspiration of the peroneal artery, thrombotic 100% reduced to less than 10%.  Brisk flow was evident in both the anterior tibial and peroneal arteries at the end of the procedure.  The popliteal artery had organized thrombus with a diffuse 50% stenosis. The Viabahn stent placed in the left distal SFA, proximal popliteal artery remains occluded.  05/02/2018:  +-------+-----------+-----------+------------+------------+ ABI/TBIToday's ABIToday's TBIPrevious ABIPrevious TBI +-------+-----------+-----------+------------+------------+ Right 0.82    0.82                 +-------+-----------+-----------+------------+------------+ Left  0.43    0                  +-------+-----------+-----------+------------+------------+  Summary: Right: Resting right ankle-brachial index indicates mild right lower extremity arterial disease. The right toe-brachial index is normal.  Left: Resting left ankle-brachial index indicates severe left lower extremity arterial disease. The left toe-brachial index is abnormal.     Hospital Course: Patient had undergone lower extremity arteriogram and angioplasty to his left distal SFA and popliteal artery on 08/06/2017 by atherectomy and drug-coated balloon angioplasty.  However he presented with recurrence of ulceration on his lateral aspect of his leg, outpatient Dopplers had revealed severely decreased ABI in the range of critical limb ischemia at 0.4.  Repeat angiography revealed occluded prior angioplasty site on 04/22/2018.  He underwent directional atherectomy followed by stenting with Viabahn 6x150 mm covered stent.  Within 5 days his ulceration healed however he  presented to the office with severe pain and repeat ABI revealed occluded angioplasty site and also severely decreased  ABI again at 0.4.  He was then admitted to the hospital the same day on 04/28/2018 and underwent angiography on 04/29/2018 and was found to have total occlusion of the left distal SFA and popliteal artery, he underwent mechanical aspiration thrombectomy with Penumbra Cat 6 Kit, but still had significant thrombotic burden hence underwent catheter directed thrombolytic therapy for 12 hours.  Following this repeat angiography revealed persistent thrombus burden with no significant revascularization.  There was distal embolization into the left anterior tibial and also left peroneal artery for which he underwent balloon angioplasty with excellent results.  He was then transferred to the intensive care unit and eventually transferred to the telemetry floor for recuperation and observation.  He was then started on Xarelto, DVT dose in view of significant thrombus burden, Plavix discontinued and started on aspirin 81 mg daily.  Physical therapy was consulted.  As patient was unable to walk without significant pain, it is best felt that SNF placement would be more appropriate.  He is being discharged today with outpatient follow-up.  At discharge, his left leg is warm and there is no evidence of any skin breakdown.  He did have left little toe at the tip had gangrenous changes but nothing to be done about this.  Eventual plan will be to restudy him in 8 to 12 weeks depending upon his clinical progress.  Surgical revascularization is still an option.  Recommendations on discharge: Xarelto 15 mg PO BID until 05/23/2018 and on that day change to 29 mg daily after dinner. 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 There is no open wounds.  Discharge Exam: Blood pressure 109/77, pulse (!) 56, temperature 98 F (36.7 C), temperature source Oral, resp. rate 15,  height 6' 2"  (1.88 m), weight 114.8 kg, SpO2 100 %.   General appearance: alert, cooperative, no distress and mildly obese Eyes: negative findings: lids and lashes normal Neck: no adenopathy, no carotid bruit, no JVD and supple, symmetrical, trachea midline Neck: JVP - normal, carotids 2+= without bruits Resp: clear to auscultation bilaterally Chest wall: no tenderness Cardio: regular rate and rhythm, S1, S2 normal, no murmur, click, rub or gallop GI: soft, non-tender; bowel sounds normal; no masses,  no organomegaly Extremities: Bilateral 2+ pitting edema.  Both the legs are warm.  No skin breakdown.  Absent pedal pulses. Left little toe tip gangrene.   Labs:   Lab Results  Component Value Date   WBC 9.6 05/08/2018   HGB 8.0 (L) 05/08/2018   HCT 25.3 (L) 05/08/2018   MCV 79.6 (L) 05/08/2018   PLT 440 (H) 05/08/2018    Recent Labs  Lab 05/08/18 0729  NA 124*  K 4.2  CL 93*  CO2 21*  BUN 31*  CREATININE 1.77*  CALCIUM 8.6*  GLUCOSE 256*    Lipid Panel     Component Value Date/Time   LDLCALC 115 03/15/2016    BNP (last 3 results) No results for input(s): BNP in the last 8760 hours.  HEMOGLOBIN A1C Lab Results  Component Value Date   HGBA1C 8.5 03/15/2016   MPG 229 (H) 12/02/2012    Cardiac Panel (last 3 results) No results for input(s): CKTOTAL, CKMB, TROPONINI, RELINDX in the last 8760 hours.  Lab Results  Component Value Date   CKTOTAL 2,746 (H) 12/02/2012   TROPONINI <0.30 12/02/2012   Radiology: Dg Chest Port 1 View  Result Date: 04/29/2018 CLINICAL DATA:  Fever. EXAM: PORTABLE CHEST 1 VIEW COMPARISON:  None. FINDINGS: Stable cardiomediastinal silhouette. Both lungs are clear. The visualized skeletal structures are unremarkable. IMPRESSION: No active disease. Electronically Signed   By: Marijo Conception, M.D.   On: 04/29/2018 08:47   Vas [place] Burnard Bunting With/wo Tbi  Result Date: 05/02/2018 LOWER EXTREMITY DOPPLER STUDY Indications: Ischemia critical limb.   Comparison Study: No prior study available Performing Technologist: COLE, Hongying RDMS, RVT  Examination Guidelines: A complete evaluation includes at minimum, Doppler waveform signals and systolic blood pressure reading at the level of bilateral brachial, anterior tibial, and posterior tibial arteries, when vessel segments are accessible. Bilateral testing is considered an integral part of a complete examination. Photoelectric Plethysmograph (PPG) waveforms and toe systolic pressure readings are included as required and additional duplex testing as needed. Limited examinations for reoccurring indications may be performed as noted.  ABI Findings: +---------+------------------+-----+---------+--------+ Right    Rt Pressure (mmHg)IndexWaveform Comment  +---------+------------------+-----+---------+--------+ Brachial 131                    triphasic         +---------+------------------+-----+---------+--------+ PTA      98                0.74 triphasic         +---------+------------------+-----+---------+--------+ DP       108               0.82 triphasic         +---------+------------------+-----+---------+--------+ Great Toe108               0.82                   +---------+------------------+-----+---------+--------+ +---------+------------------+-----+-------------------+-------+ Left     Lt Pressure (mmHg)IndexWaveform           Comment +---------+------------------+-----+-------------------+-------+ Brachial 132                    triphasic                  +---------+------------------+-----+-------------------+-------+ PTA      57                0.43 dampened monophasic        +---------+------------------+-----+-------------------+-------+ DP       52                0.39 dampened monophasic        +---------+------------------+-----+-------------------+-------+ Great Toe0                 0.00 Absent                      +---------+------------------+-----+-------------------+-------+ +-------+-----------+-----------+------------+------------+ ABI/TBIToday's ABIToday's TBIPrevious ABIPrevious TBI +-------+-----------+-----------+------------+------------+ Right  0.82       0.82                                +-------+-----------+-----------+------------+------------+ Left   0.43       0                                   +-------+-----------+-----------+------------+------------+  Summary: Right: Resting right ankle-brachial index indicates mild right lower extremity arterial disease. The right toe-brachial index is normal. Left: Resting left ankle-brachial index indicates severe left lower extremity arterial disease. The left toe-brachial index is abnormal.  *See table(s) above for measurements and observations.  Electronically signed by Monica Martinez MD on 05/02/2018 at 5:02:49 PM.    Final    FOLLOW UP PLANS AND APPOINTMENTS Adrian Prows on 06/12/2018 at 11:30 AM  Allergies as of 05/09/2018      Reactions   No Known Allergies       Medication List    STOP taking these medications   clopidogrel 75 MG tablet Commonly known as:  PLAVIX     TAKE these medications   allopurinol 100 MG tablet Commonly known as:  ZYLOPRIM Take 100 mg by mouth daily.   aspirin 81 MG EC tablet Take 1 tablet (81 mg total) by mouth daily. Start taking on:  05/10/2018   carvedilol 12.5 MG tablet Commonly known as:  COREG Take 12.5 mg by mouth daily at 2 PM.   clobetasol cream 0.05 % Commonly known as:  TEMOVATE Apply 1 application topically 2 (two) times daily as needed (rash).   colchicine 0.6 MG tablet Take 0.6 mg by mouth 2 (two) times daily.   dorzolamide-timolol 22.3-6.8 MG/ML ophthalmic solution Commonly known as:  COSOPT Place 1 drop into both eyes 2 (two) times daily.   l-methylfolate-B6-B12 3-35-2 MG Tabs tablet Commonly known as:  METANX Take 1 tablet by mouth 2 (two) times daily.    losartan 100 MG tablet Commonly known as:  COZAAR Take 100 mg by mouth daily at 2 PM.   metFORMIN 1000 MG tablet Commonly known as:  GLUCOPHAGE Take 1 tablet (1,000 mg total) by mouth 2 (two) times daily with a meal.   NOVOLOG 100 UNIT/ML injection Generic drug:  insulin aspart Inject 1.25 Units into the skin as directed. 1.25 units per hour, roughly 30 units per 24 hours   oxyCODONE-acetaminophen 10-325 MG tablet Commonly known as:  PERCOCET Take 1 tablet by mouth every 4 (four) hours as needed for up to 15 days for pain. What changed:  when to take this   pravastatin 40 MG tablet Commonly known as:  PRAVACHOL Take 40 mg by mouth at bedtime.   pregabalin 50 MG capsule Commonly known as:  LYRICA Take 50-100 mg by mouth at bedtime.   Rivaroxaban 15 MG Tabs tablet Commonly known as:  XARELTO Take 1 tablet (15 mg total) by mouth 2 (two) times daily with a meal. What changed:    medication strength  how much to take  when to take this   rivaroxaban 20 MG Tabs tablet Commonly known as:  XARELTO Take 1 tablet (20 mg total) by mouth daily with supper. Start taking on:  05/23/2018 What changed:  You were already taking a medication with the same name, and this prescription was added. Make sure you understand how and when to take each.   traMADol 50 MG tablet Commonly known as:  ULTRAM Take 1 tablet (50 mg total) by mouth every 6 (six) hours as needed for moderate pain.   TRAVATAN Z 0.004 % Soln ophthalmic solution Generic drug:  Travoprost (BAK Free) Place 1 drop into both eyes at bedtime.   triamterene-hydrochlorothiazide 37.5-25 MG tablet Commonly known as:  MAXZIDE-25 Take 1 tablet by mouth daily at 2 PM.   V-GO 30 Kit See admin instructions. Uses Novolog insulin in pump   VICTOZA 18 MG/3ML Sopn Generic drug:  liraglutide Inject 0.2 mLs (1.2 mg total) into the skin daily. Inject once daily at the same time What changed:  when to take this             Durable Medical Equipment  (From  admission, onward)         Start     Ordered   05/07/18 0850  For home use only DME Crutches  Once     05/07/18 0849           Adrian Prows, MD 05/09/2018, 11:56 AM  Pager: [phone] Office: [phone] If no answer: [phone]

## 2018-05-09 NOTE — Progress Notes (Addendum)
Patient will discharge to [facility] at [facility]. Anticipated discharge date: 05/09/18 Family notified: [name], spouse Transportation by: Corey Harold  Nurse to call report to [phone]. Patient will go to room 7013 at the facility.  CSW signing off.  Estanislado Emms, [place]  Clinical Social Worker

## 2018-05-09 NOTE — Progress Notes (Signed)
Physical Therapy Treatment Patient Details Name: [name] MRN: [id number] DOB: 07/02/1950 Today's Date: 05/09/2018    History of Present Illness 66yo male with ongoing pain due to peripheral artery disease and with abnormal ABI. He received peripheral vascular cath on 10/29, 11/5, 11/6, and lysis recheck on 11/6, also thrombectomy on 11/7. PMH DM, distant history of TB, HTN, diabetic neuropathy, multiple vascular surgery interventions     PT Comments    Patient progressing slowly towards his physical therapy goals. Persistent decreased pain tolerance. Unable to stand from lowered bed surface and requiring minimal assistance to stand from elevated bed. Continues to display self imposed LLE nonweightbearing despite extensive education/instruction for attempting to progress to partial weightbearing. Demonstrating poor balance with hop to method, requiring up to moderate assistance due to lateral LOB. Educated patient that he is at significantly high risk for falls with continuing to hop versus weightbearing as tolerated. Plan to discharge to SNF today.    Follow Up Recommendations  SNF     Equipment Recommendations  None recommended by PT    Recommendations for Other Services       Precautions / Restrictions Precautions Precautions: Fall Restrictions Weight Bearing Restrictions: No RLE Weight Bearing: Weight bearing as tolerated    Mobility  Bed Mobility Overal bed mobility: Needs Assistance Bed Mobility: Supine to Sit;Sit to Supine     Supine to sit: Supervision Sit to supine: Supervision   General bed mobility comments: Increased time and effort but no physical assistance required  Transfers Overall transfer level: Needs assistance Equipment used: Rolling walker (2 wheeled) Transfers: Sit to/from Stand Sit to Stand: Min assist         General transfer comment: Unable to stand from lower bed surface and required bed height elevated. Very  effortful  Ambulation/Gait Ambulation/Gait assistance: Min assist;Mod assist Gait Distance (Feet): 15 Feet Assistive device: Rolling walker (2 wheeled) Gait Pattern/deviations: Step-to pattern;Decreased stride length;Antalgic;Decreased weight shift to left Gait velocity: decreased   General Gait Details: Heavy reliance through BUE's on walker due to tendency for self imposed LLE NWB. Fatigues quickly. Max verbal cueing for sequencing and increasing LLE weightbearing as tolerated.  2 episodes of lateral LOB when turning requiring moderate assistance to correct   Stairs             Wheelchair Mobility    Modified Rankin (Stroke Patients Only)       Balance Overall balance assessment: Needs assistance Sitting-balance support: No upper extremity supported;Feet supported Sitting balance-Leahy Scale: Good     Standing balance support: Bilateral upper extremity supported;During functional activity Standing balance-Leahy Scale: Poor Standing balance comment: Heavy reliance on walker. Leaning anteriorly against sink when washing his hands                            Cognition Arousal/Alertness: Awake/alert Behavior During Therapy: Anxious Overall Cognitive Status: Impaired/Different from baseline Area of Impairment: Problem solving;Safety/judgement                         Safety/Judgement: Decreased awareness of safety;Decreased awareness of deficits   Problem Solving: Requires verbal cues;Difficulty sequencing General Comments: Poor problem solving skills and safety awareness      Exercises Other Exercises Other Exercises: Long sitting left gastroc stretch with blanket x 1 minute hold    General Comments  Total assist for donning left sock.      Pertinent Vitals/Pain Pain Assessment: Faces  Faces Pain Scale: Hurts whole lot Pain Location: LLE Pain Descriptors / Indicators: Guarding;Aching Pain Intervention(s): Monitored during session     Home Living                      Prior Function            PT Goals (current goals can now be found in the care plan section) Acute Rehab PT Goals Patient Stated Goal: decrease pain Potential to Achieve Goals: Fair Progress towards PT goals: Progressing toward goals    Frequency    Min 3X/week      PT Plan Current plan remains appropriate    Co-evaluation              AM-PAC PT "6 Clicks" Daily Activity  Outcome Measure  Difficulty turning over in bed (including adjusting bedclothes, sheets and blankets)?: A Little Difficulty moving from lying on back to sitting on the side of the bed? : A Little Difficulty sitting down on and standing up from a chair with arms (e.g., wheelchair, bedside commode, etc,.)?: A Lot Help needed moving to and from a bed to chair (including a wheelchair)?: A Little Help needed walking in hospital room?: A Little Help needed climbing 3-5 steps with a railing? : A Lot 6 Click Score: 16    End of Session Equipment Utilized During Treatment: Gait belt Activity Tolerance: Patient tolerated treatment well Patient left: in bed;with call bell/phone within reach(seated EOB) Nurse Communication: Mobility status PT Visit Diagnosis: Unsteadiness on feet (R26.81);Difficulty in walking, not elsewhere classified (R26.2);Pain;Muscle weakness (generalized) (M62.81);Other abnormalities of gait and mobility (R26.89) Pain - Right/Left: Left Pain - part of body: Leg     Time: 3244-0102 PT Time Calculation (min) (ACUTE ONLY): 19 min  Charges:  $Gait Training: 8-22 mins                     Ellamae Sia, PT, DPT Acute Rehabilitation Services Pager [phone] Office [phone]    Willy Eddy 05/09/2018, 3:40 PM

## 2018-05-26 ENCOUNTER — Encounter (HOSPITAL_BASED_OUTPATIENT_CLINIC_OR_DEPARTMENT_OTHER): Payer: Self-pay

## 2018-05-26 ENCOUNTER — Encounter (HOSPITAL_BASED_OUTPATIENT_CLINIC_OR_DEPARTMENT_OTHER): Payer: BC Managed Care – PPO | Attending: Internal Medicine

## 2018-05-26 DIAGNOSIS — G4730 Sleep apnea, unspecified: Secondary | ICD-10-CM | POA: Insufficient documentation

## 2018-05-26 DIAGNOSIS — L97526 Non-pressure chronic ulcer of other part of left foot with bone involvement without evidence of necrosis: Secondary | ICD-10-CM | POA: Insufficient documentation

## 2018-05-26 DIAGNOSIS — L97522 Non-pressure chronic ulcer of other part of left foot with fat layer exposed: Secondary | ICD-10-CM | POA: Insufficient documentation

## 2018-05-26 DIAGNOSIS — E1140 Type 2 diabetes mellitus with diabetic neuropathy, unspecified: Secondary | ICD-10-CM | POA: Insufficient documentation

## 2018-05-26 DIAGNOSIS — I96 Gangrene, not elsewhere classified: Secondary | ICD-10-CM | POA: Insufficient documentation

## 2018-05-26 DIAGNOSIS — L97822 Non-pressure chronic ulcer of other part of left lower leg with fat layer exposed: Secondary | ICD-10-CM | POA: Insufficient documentation

## 2018-05-26 DIAGNOSIS — E1152 Type 2 diabetes mellitus with diabetic peripheral angiopathy with gangrene: Secondary | ICD-10-CM | POA: Diagnosis not present

## 2018-05-26 DIAGNOSIS — Z95820 Peripheral vascular angioplasty status with implants and grafts: Secondary | ICD-10-CM | POA: Diagnosis not present

## 2018-05-26 DIAGNOSIS — E11621 Type 2 diabetes mellitus with foot ulcer: Secondary | ICD-10-CM | POA: Diagnosis not present

## 2018-06-09 DIAGNOSIS — E11621 Type 2 diabetes mellitus with foot ulcer: Secondary | ICD-10-CM | POA: Diagnosis not present

## 2018-06-23 ENCOUNTER — Ambulatory Visit (HOSPITAL_COMMUNITY)
Admission: RE | Admit: 2018-06-23 | Discharge: 2018-06-23 | Disposition: A | Discharge status: Home / Self Care | Payer: BC Managed Care – PPO | Source: Ambulatory Visit | Attending: Internal Medicine | Admitting: Internal Medicine

## 2018-06-23 ENCOUNTER — Other Ambulatory Visit (HOSPITAL_COMMUNITY): Payer: Self-pay | Admitting: Internal Medicine

## 2018-06-23 DIAGNOSIS — E1169 Type 2 diabetes mellitus with other specified complication: Secondary | ICD-10-CM | POA: Diagnosis present

## 2018-06-23 DIAGNOSIS — E11621 Type 2 diabetes mellitus with foot ulcer: Secondary | ICD-10-CM | POA: Insufficient documentation

## 2018-06-23 DIAGNOSIS — L97509 Non-pressure chronic ulcer of other part of unspecified foot with unspecified severity: Secondary | ICD-10-CM | POA: Diagnosis present

## 2018-06-23 DIAGNOSIS — M869 Osteomyelitis, unspecified: Principal | ICD-10-CM

## 2018-06-27 ENCOUNTER — Encounter (HOSPITAL_COMMUNITY): Payer: Self-pay

## 2018-06-27 ENCOUNTER — Encounter (HOSPITAL_BASED_OUTPATIENT_CLINIC_OR_DEPARTMENT_OTHER): Payer: BC Managed Care – PPO | Attending: Internal Medicine

## 2018-06-27 ENCOUNTER — Other Ambulatory Visit: Payer: Self-pay

## 2018-06-27 ENCOUNTER — Inpatient Hospital Stay (HOSPITAL_COMMUNITY)
Admission: EM | Admit: 2018-06-27 | Discharge: 2018-07-10 | DRG: 271 | Disposition: A | Discharge status: Skilled Nursing Facility | Payer: BC Managed Care – PPO | Attending: Internal Medicine | Admitting: Internal Medicine

## 2018-06-27 DIAGNOSIS — D62 Acute posthemorrhagic anemia: Secondary | ICD-10-CM | POA: Diagnosis not present

## 2018-06-27 DIAGNOSIS — E11621 Type 2 diabetes mellitus with foot ulcer: Secondary | ICD-10-CM | POA: Diagnosis not present

## 2018-06-27 DIAGNOSIS — L97528 Non-pressure chronic ulcer of other part of left foot with other specified severity: Secondary | ICD-10-CM

## 2018-06-27 DIAGNOSIS — I82432 Acute embolism and thrombosis of left popliteal vein: Secondary | ICD-10-CM | POA: Diagnosis present

## 2018-06-27 DIAGNOSIS — I739 Peripheral vascular disease, unspecified: Secondary | ICD-10-CM | POA: Diagnosis not present

## 2018-06-27 DIAGNOSIS — L97529 Non-pressure chronic ulcer of other part of left foot with unspecified severity: Secondary | ICD-10-CM | POA: Diagnosis present

## 2018-06-27 DIAGNOSIS — M109 Gout, unspecified: Secondary | ICD-10-CM | POA: Diagnosis present

## 2018-06-27 DIAGNOSIS — D509 Iron deficiency anemia, unspecified: Secondary | ICD-10-CM | POA: Diagnosis present

## 2018-06-27 DIAGNOSIS — L97401 Non-pressure chronic ulcer of unspecified heel and midfoot limited to breakdown of skin: Secondary | ICD-10-CM | POA: Diagnosis not present

## 2018-06-27 DIAGNOSIS — E10621 Type 1 diabetes mellitus with foot ulcer: Secondary | ICD-10-CM | POA: Diagnosis present

## 2018-06-27 DIAGNOSIS — E1069 Type 1 diabetes mellitus with other specified complication: Secondary | ICD-10-CM | POA: Diagnosis present

## 2018-06-27 DIAGNOSIS — Z95820 Peripheral vascular angioplasty status with implants and grafts: Secondary | ICD-10-CM | POA: Diagnosis not present

## 2018-06-27 DIAGNOSIS — E119 Type 2 diabetes mellitus without complications: Secondary | ICD-10-CM

## 2018-06-27 DIAGNOSIS — I10 Essential (primary) hypertension: Secondary | ICD-10-CM | POA: Diagnosis not present

## 2018-06-27 DIAGNOSIS — L02612 Cutaneous abscess of left foot: Secondary | ICD-10-CM

## 2018-06-27 DIAGNOSIS — IMO0001 Reserved for inherently not codable concepts without codable children: Secondary | ICD-10-CM

## 2018-06-27 DIAGNOSIS — Z7902 Long term (current) use of antithrombotics/antiplatelets: Secondary | ICD-10-CM

## 2018-06-27 DIAGNOSIS — L97509 Non-pressure chronic ulcer of other part of unspecified foot with unspecified severity: Secondary | ICD-10-CM | POA: Diagnosis not present

## 2018-06-27 DIAGNOSIS — L03032 Cellulitis of left toe: Secondary | ICD-10-CM

## 2018-06-27 DIAGNOSIS — Z9641 Presence of insulin pump (external) (internal): Secondary | ICD-10-CM | POA: Diagnosis present

## 2018-06-27 DIAGNOSIS — Z419 Encounter for procedure for purposes other than remedying health state, unspecified: Secondary | ICD-10-CM

## 2018-06-27 DIAGNOSIS — E10628 Type 1 diabetes mellitus with other skin complications: Secondary | ICD-10-CM | POA: Diagnosis present

## 2018-06-27 DIAGNOSIS — G4733 Obstructive sleep apnea (adult) (pediatric): Secondary | ICD-10-CM | POA: Diagnosis present

## 2018-06-27 DIAGNOSIS — E1042 Type 1 diabetes mellitus with diabetic polyneuropathy: Secondary | ICD-10-CM | POA: Diagnosis present

## 2018-06-27 DIAGNOSIS — M87875 Other osteonecrosis, left foot: Secondary | ICD-10-CM | POA: Diagnosis present

## 2018-06-27 DIAGNOSIS — I96 Gangrene, not elsewhere classified: Secondary | ICD-10-CM

## 2018-06-27 DIAGNOSIS — E1052 Type 1 diabetes mellitus with diabetic peripheral angiopathy with gangrene: Secondary | ICD-10-CM | POA: Diagnosis present

## 2018-06-27 DIAGNOSIS — E1022 Type 1 diabetes mellitus with diabetic chronic kidney disease: Secondary | ICD-10-CM | POA: Diagnosis present

## 2018-06-27 DIAGNOSIS — N183 Chronic kidney disease, stage 3 unspecified: Secondary | ICD-10-CM

## 2018-06-27 DIAGNOSIS — M868X7 Other osteomyelitis, ankle and foot: Secondary | ICD-10-CM | POA: Diagnosis present

## 2018-06-27 DIAGNOSIS — Z794 Long term (current) use of insulin: Secondary | ICD-10-CM

## 2018-06-27 DIAGNOSIS — N189 Chronic kidney disease, unspecified: Secondary | ICD-10-CM | POA: Diagnosis present

## 2018-06-27 DIAGNOSIS — D649 Anemia, unspecified: Secondary | ICD-10-CM | POA: Diagnosis not present

## 2018-06-27 DIAGNOSIS — E08621 Diabetes mellitus due to underlying condition with foot ulcer: Secondary | ICD-10-CM | POA: Diagnosis not present

## 2018-06-27 DIAGNOSIS — Z79899 Other long term (current) drug therapy: Secondary | ICD-10-CM

## 2018-06-27 DIAGNOSIS — E10319 Type 1 diabetes mellitus with unspecified diabetic retinopathy without macular edema: Secondary | ICD-10-CM | POA: Diagnosis present

## 2018-06-27 DIAGNOSIS — I129 Hypertensive chronic kidney disease with stage 1 through stage 4 chronic kidney disease, or unspecified chronic kidney disease: Secondary | ICD-10-CM | POA: Diagnosis present

## 2018-06-27 LAB — CBC WITH DIFFERENTIAL/PLATELET
Abs Immature Granulocytes: 0.07 10*3/uL (ref 0.00–0.07)
Basophils Absolute: 0 10*3/uL (ref 0.0–0.1)
Basophils Relative: 0 %
Eosinophils Absolute: 0.1 10*3/uL (ref 0.0–0.5)
Eosinophils Relative: 0 %
HCT: 30 % — ABNORMAL LOW (ref 39.0–52.0)
Hemoglobin: 9.2 g/dL — ABNORMAL LOW (ref 13.0–17.0)
Immature Granulocytes: 1 %
Lymphocytes Relative: 6 %
Lymphs Abs: 0.6 10*3/uL — ABNORMAL LOW (ref 0.7–4.0)
MCH: 23.6 pg — ABNORMAL LOW (ref 26.0–34.0)
MCHC: 30.7 g/dL (ref 30.0–36.0)
MCV: 76.9 fL — ABNORMAL LOW (ref 80.0–100.0)
Monocytes Absolute: 0.9 10*3/uL (ref 0.1–1.0)
Monocytes Relative: 8 %
Neutro Abs: 9.6 10*3/uL — ABNORMAL HIGH (ref 1.7–7.7)
Neutrophils Relative %: 85 %
Platelets: 326 10*3/uL (ref 150–400)
RBC: 3.9 MIL/uL — ABNORMAL LOW (ref 4.22–5.81)
RDW: 18.1 % — ABNORMAL HIGH (ref 11.5–15.5)
WBC: 11.2 10*3/uL — ABNORMAL HIGH (ref 4.0–10.5)
nRBC: 0 % (ref 0.0–0.2)

## 2018-06-27 LAB — BASIC METABOLIC PANEL
Anion gap: 13 (ref 5–15)
BUN: 25 mg/dL — ABNORMAL HIGH (ref 8–23)
CO2: 25 mmol/L (ref 22–32)
Calcium: 9.4 mg/dL (ref 8.9–10.3)
Chloride: 96 mmol/L — ABNORMAL LOW (ref 98–111)
Creatinine, Ser: 1.64 mg/dL — ABNORMAL HIGH (ref 0.61–1.24)
GFR calc Af Amer: 49 mL/min — ABNORMAL LOW (ref >60)
GFR calc non Af Amer: 43 mL/min — ABNORMAL LOW (ref >60)
Glucose, Bld: 133 mg/dL — ABNORMAL HIGH (ref 70–99)
Potassium: 3.7 mmol/L (ref 3.5–5.1)
Sodium: 134 mmol/L — ABNORMAL LOW (ref 135–145)

## 2018-06-27 LAB — GLUCOSE, CAPILLARY
Glucose-Capillary: 97 mg/dL (ref 70–99)
Glucose-Capillary: 47 mg/dL — ABNORMAL LOW (ref 70–99)
Glucose-Capillary: 70 mg/dL (ref 70–99)
Glucose-Capillary: 74 mg/dL (ref 70–99)
Glucose-Capillary: 114 mg/dL — ABNORMAL HIGH (ref 70–99)

## 2018-06-27 LAB — CBG MONITORING, ED: Glucose-Capillary: 79 mg/dL (ref 70–99)

## 2018-06-27 LAB — MRSA PCR SCREENING: MRSA by PCR: POSITIVE — AB

## 2018-06-27 MED ORDER — INSULIN ASPART 100 UNIT/ML ~~LOC~~ SOLN
3.0000 [IU] | Freq: Three times a day (TID) | SUBCUTANEOUS | Status: DC
  Administered 2018-06-27 – 2018-07-02 (×8): 3 [IU] via SUBCUTANEOUS

## 2018-06-27 MED ORDER — LATANOPROST 0.005 % OP SOLN
1.0000 [drp] | Freq: Every day | OPHTHALMIC | Status: DC
  Administered 2018-07-01 – 2018-07-09 (×9): 1 [drp] via OPHTHALMIC
  Filled 2018-06-27: qty 2.5

## 2018-06-27 MED ORDER — MUPIROCIN 2 % EX OINT
1.0000 "application " | TOPICAL_OINTMENT | Freq: Two times a day (BID) | CUTANEOUS | Status: AC
  Administered 2018-06-27 – 2018-07-02 (×10): 1 via NASAL
  Filled 2018-06-27 (×4): qty 22

## 2018-06-27 MED ORDER — SODIUM CHLORIDE 0.9 % IV BOLUS
500.0000 mL | Freq: Once | INTRAVENOUS | Status: AC
  Administered 2018-06-27: 500 mL via INTRAVENOUS

## 2018-06-27 MED ORDER — INSULIN ASPART 100 UNIT/ML ~~LOC~~ SOLN
0.0000 [IU] | Freq: Three times a day (TID) | SUBCUTANEOUS | Status: DC
  Administered 2018-06-28 – 2018-06-29 (×2): 2 [IU] via SUBCUTANEOUS
  Administered 2018-06-29 (×2): 3 [IU] via SUBCUTANEOUS
  Administered 2018-06-30: 2 [IU] via SUBCUTANEOUS
  Administered 2018-06-30 – 2018-07-01 (×2): 5 [IU] via SUBCUTANEOUS
  Administered 2018-07-01: 2 [IU] via SUBCUTANEOUS
  Administered 2018-07-02 (×3): 5 [IU] via SUBCUTANEOUS
  Administered 2018-07-03: 2 [IU] via SUBCUTANEOUS
  Administered 2018-07-03: 5 [IU] via SUBCUTANEOUS
  Administered 2018-07-03 – 2018-07-04 (×2): 3 [IU] via SUBCUTANEOUS
  Administered 2018-07-04 – 2018-07-05 (×3): 2 [IU] via SUBCUTANEOUS
  Administered 2018-07-05 – 2018-07-06 (×2): 3 [IU] via SUBCUTANEOUS
  Administered 2018-07-06: 2 [IU] via SUBCUTANEOUS
  Administered 2018-07-06: 8 [IU] via SUBCUTANEOUS
  Administered 2018-07-07 (×2): 2 [IU] via SUBCUTANEOUS
  Administered 2018-07-07 – 2018-07-08 (×2): 3 [IU] via SUBCUTANEOUS
  Administered 2018-07-08: 2 [IU] via SUBCUTANEOUS
  Administered 2018-07-08 – 2018-07-10 (×4): 3 [IU] via SUBCUTANEOUS
  Administered 2018-07-10: 5 [IU] via SUBCUTANEOUS

## 2018-06-27 MED ORDER — DORZOLAMIDE HCL-TIMOLOL MAL 2-0.5 % OP SOLN
1.0000 [drp] | Freq: Two times a day (BID) | OPHTHALMIC | Status: DC
  Administered 2018-07-01 – 2018-07-10 (×17): 1 [drp] via OPHTHALMIC
  Filled 2018-06-27: qty 10

## 2018-06-27 MED ORDER — SODIUM CHLORIDE 0.9 % IV SOLN
INTRAVENOUS | Status: DC | PRN
  Administered 2018-06-27: 1000 mL via INTRAVENOUS

## 2018-06-27 MED ORDER — VANCOMYCIN HCL 10 G IV SOLR
2000.0000 mg | Freq: Once | INTRAVENOUS | Status: AC
  Administered 2018-06-27: 2000 mg via INTRAVENOUS
  Filled 2018-06-27: qty 2000

## 2018-06-27 MED ORDER — METRONIDAZOLE IN NACL 5-0.79 MG/ML-% IV SOLN
500.0000 mg | Freq: Three times a day (TID) | INTRAVENOUS | Status: DC
  Administered 2018-06-27 – 2018-06-28 (×3): 500 mg via INTRAVENOUS
  Filled 2018-06-27 (×4): qty 100

## 2018-06-27 MED ORDER — INSULIN ASPART 100 UNIT/ML ~~LOC~~ SOLN
0.0000 [IU] | Freq: Every day | SUBCUTANEOUS | Status: DC
  Administered 2018-07-01: 4 [IU] via SUBCUTANEOUS

## 2018-06-27 MED ORDER — ONDANSETRON HCL 4 MG/2ML IJ SOLN: 4.0000 mg | Freq: Four times a day (QID) | INTRAMUSCULAR | Status: DC | PRN

## 2018-06-27 MED ORDER — COLCHICINE 0.6 MG PO TABS
0.6000 mg | ORAL_TABLET | Freq: Two times a day (BID) | ORAL | Status: DC
  Administered 2018-06-27 – 2018-06-30 (×7): 0.6 mg via ORAL
  Filled 2018-06-27 (×8): qty 1

## 2018-06-27 MED ORDER — ACETAMINOPHEN 325 MG PO TABS
650.0000 mg | ORAL_TABLET | Freq: Four times a day (QID) | ORAL | Status: DC | PRN
  Administered 2018-07-07: 650 mg via ORAL
  Filled 2018-06-27 (×2): qty 2

## 2018-06-27 MED ORDER — SODIUM CHLORIDE 0.9 % IV SOLN
2.0000 g | INTRAVENOUS | Status: DC
  Administered 2018-06-27: 2 g via INTRAVENOUS
  Filled 2018-06-27 (×2): qty 20

## 2018-06-27 MED ORDER — INSULIN GLARGINE 100 UNIT/ML ~~LOC~~ SOLN
15.0000 [IU] | Freq: Every day | SUBCUTANEOUS | Status: DC
  Administered 2018-06-27 – 2018-07-01 (×5): 15 [IU] via SUBCUTANEOUS
  Filled 2018-06-27 (×6): qty 0.15

## 2018-06-27 MED ORDER — PREGABALIN 25 MG PO CAPS
50.0000 mg | ORAL_CAPSULE | Freq: Every day | ORAL | Status: DC
  Administered 2018-06-27 – 2018-07-06 (×10): 50 mg via ORAL
  Administered 2018-07-07: 100 mg via ORAL
  Administered 2018-07-08 – 2018-07-09 (×2): 50 mg via ORAL
  Filled 2018-06-27 (×13): qty 2

## 2018-06-27 MED ORDER — OXYCODONE-ACETAMINOPHEN 5-325 MG PO TABS
0.5000 | ORAL_TABLET | Freq: Four times a day (QID) | ORAL | Status: DC | PRN
  Administered 2018-06-27 – 2018-06-30 (×8): 1 via ORAL
  Filled 2018-06-27 (×8): qty 1

## 2018-06-27 MED ORDER — LOSARTAN POTASSIUM 50 MG PO TABS
100.0000 mg | ORAL_TABLET | Freq: Every day | ORAL | Status: DC
  Administered 2018-06-27 – 2018-06-29 (×3): 100 mg via ORAL
  Filled 2018-06-27 (×3): qty 2

## 2018-06-27 MED ORDER — INSULIN PUMP: Freq: Three times a day (TID) | SUBCUTANEOUS | Status: DC

## 2018-06-27 MED ORDER — CHLORHEXIDINE GLUCONATE CLOTH 2 % EX PADS
6.0000 | MEDICATED_PAD | Freq: Every day | CUTANEOUS | Status: AC
  Administered 2018-06-30 – 2018-07-02 (×3): 6 via TOPICAL

## 2018-06-27 MED ORDER — VANCOMYCIN HCL 10 G IV SOLR
1500.0000 mg | INTRAVENOUS | Status: DC
  Administered 2018-06-28: 1500 mg via INTRAVENOUS
  Filled 2018-06-27: qty 1500

## 2018-06-27 MED ORDER — PRAVASTATIN SODIUM 40 MG PO TABS
40.0000 mg | ORAL_TABLET | Freq: Every day | ORAL | Status: DC
  Administered 2018-06-27 – 2018-07-09 (×13): 40 mg via ORAL
  Filled 2018-06-27 (×13): qty 1

## 2018-06-27 MED ORDER — ALLOPURINOL 100 MG PO TABS
100.0000 mg | ORAL_TABLET | Freq: Every day | ORAL | Status: DC
  Administered 2018-06-27 – 2018-06-29 (×3): 100 mg via ORAL
  Filled 2018-06-27 (×4): qty 1

## 2018-06-27 MED ORDER — RIVAROXABAN 2.5 MG PO TABS
2.5000 mg | ORAL_TABLET | Freq: Two times a day (BID) | ORAL | Status: DC
  Administered 2018-06-27 – 2018-06-29 (×5): 2.5 mg via ORAL
  Filled 2018-06-27 (×6): qty 1

## 2018-06-27 MED ORDER — ACETAMINOPHEN 650 MG RE SUPP: 650.0000 mg | Freq: Four times a day (QID) | RECTAL | Status: DC | PRN

## 2018-06-27 MED ORDER — TRIAMTERENE-HCTZ 37.5-25 MG PO TABS
1.0000 | ORAL_TABLET | Freq: Every day | ORAL | Status: DC
  Administered 2018-06-27 – 2018-07-10 (×12): 1 via ORAL
  Filled 2018-06-27 (×12): qty 1

## 2018-06-27 MED ORDER — CARVEDILOL 12.5 MG PO TABS
12.5000 mg | ORAL_TABLET | Freq: Every day | ORAL | Status: DC
  Administered 2018-06-27 – 2018-07-10 (×12): 12.5 mg via ORAL
  Filled 2018-06-27 (×12): qty 1

## 2018-06-27 MED ORDER — CLOPIDOGREL BISULFATE 75 MG PO TABS
75.0000 mg | ORAL_TABLET | Freq: Every day | ORAL | Status: DC
  Administered 2018-06-27 – 2018-06-30 (×4): 75 mg via ORAL
  Filled 2018-06-27 (×4): qty 1

## 2018-06-27 MED ORDER — ONDANSETRON HCL 4 MG PO TABS: 4.0000 mg | ORAL_TABLET | Freq: Four times a day (QID) | ORAL | Status: DC | PRN

## 2018-06-27 NOTE — ED Triage Notes (Signed)
Patient c/o left foot swelling and pain x weeks. Patient was sent by his PCP. Patient states the left 3rd toe is black. The pinky toes is "petrified".

## 2018-06-27 NOTE — Consult Note (Signed)
[facility] Nurse wound consult note Consult requested for LLE. Pt had an ABI 11/8 which indicated "severe arterial disease" and is reported to have a necrotic toe.  X-ray results from 12/30 indicate "Soft tissue ulceration involving distal soft tissues of third toe with underlying lytic destruction of distal tuft of third distal phalanx consistent with osteomyelitis."   Provided topical treatment orders for nursing with xeroform gauze until further input is available from ortho or vascular teams. This complex medical condition is beyond the scope of practice for [facility] nurses.  Please refer to ortho or vascular service for further plan of care.   Please re-consult if further assistance is needed.  Thank-you,  Julien Girt MSN, [place], [place], [place], [place]
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
## 2018-06-27 NOTE — Progress Notes (Signed)
A consult was received from an ED physician for vancomycin per pharmacy dosing.  The patient's profile has been reviewed for ht/wt/allergies/indication/available labs.   A one time order has been placed for vancomycin 2g.    Further antibiotics/pharmacy consults should be ordered by admitting physician if indicated.                       Thank you, Peggyann Juba, PharmD, BCPS 06/27/2018  10:13 AM

## 2018-06-27 NOTE — Progress Notes (Signed)
Pharmacy Antibiotic Note  [name] is a 68 y.o. male admitted on 06/27/2018 with suspected diabetic foot infection.  Pharmacy has been consulted for vancomycin dosing.  Plan: Vancomycin 2000 mg IV x1 in ED, then 1500 mg IV q24h  Ceftriaxone 2 gr IV q24h ( MD)  Metronidazole 500 mg IV q8h  Monitor clinical course, renal function, cultures as available   Height: 6\' 1"  (185.4 cm) Weight: 230 lb (104.3 kg) IBW/kg (Calculated) : 79.9  Temp (24hrs), Avg:98.6 F (37 C), Min:98.4 F (36.9 C), Max:98.7 F (37.1 C)  Recent Labs  Lab 06/27/18 1045  WBC 11.2*  CREATININE 1.64*    Estimated Creatinine Clearance: 55.5 mL/min (A) (by C-G formula based on SCr of 1.64 mg/dL (H)).    No Known Allergies  Antimicrobials this admission: 1/3 metronidazole >>  1/3 vancomycin >>  1/3 ceftriaxone >>   Dose adjustments this admission:    Microbiology results: 1/3 HIV antibody :   Thank you for allowing pharmacy to be a part of this patient's care.  Royetta Asal, PharmD, BCPS Pager [phone] 06/27/2018 3:39 PM

## 2018-06-27 NOTE — Plan of Care (Signed)
°  Problem: Clinical Measurements: °Goal: Ability to maintain clinical measurements within normal limits will improve °Outcome: Progressing °  °Problem: Nutrition: °Goal: Adequate nutrition will be maintained °Outcome: Progressing °  °Problem: Coping: °Goal: Level of anxiety will decrease °Outcome: Progressing °  °Problem: Elimination: °Goal: Will not experience complications related to bowel motility °Outcome: Progressing °  °Problem: Pain Managment: °Goal: General experience of comfort will improve °Outcome: Progressing °  °Problem: Safety: °Goal: Ability to remain free from injury will improve °Outcome: Progressing °  °

## 2018-06-27 NOTE — Progress Notes (Signed)
Pt is refusing all eyedrops ordered and dispensed by pharmacy.

## 2018-06-27 NOTE — Care Management Note (Signed)
Case Management Note  Hoyle Sauer with [facility]) contacted CM to advise pt is active with their services.  CM advised her pt was being admitted and she will follow for needs.  Guage Efferson, Benjaman Lobe, RN 06/27/2018, 2:12 PM
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
## 2018-06-27 NOTE — Plan of Care (Signed)
°  Problem: Education: °Goal: Knowledge of General Education information will improve °Description: Including pain rating scale, medication(s)/side effects and non-pharmacologic comfort measures °Outcome: Progressing °  °Problem: Clinical Measurements: °Goal: Will remain free from infection °Outcome: Progressing °  °Problem: Pain Managment: °Goal: General experience of comfort will improve °Outcome: Progressing °  °Problem: Skin Integrity: °Goal: Risk for impaired skin integrity will decrease °Outcome: Progressing °  °
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
## 2018-06-28 ENCOUNTER — Inpatient Hospital Stay (HOSPITAL_BASED_OUTPATIENT_CLINIC_OR_DEPARTMENT_OTHER): Payer: BC Managed Care – PPO

## 2018-06-28 DIAGNOSIS — I739 Peripheral vascular disease, unspecified: Secondary | ICD-10-CM

## 2018-06-28 DIAGNOSIS — I96 Gangrene, not elsewhere classified: Secondary | ICD-10-CM

## 2018-06-28 DIAGNOSIS — N183 Chronic kidney disease, stage 3 unspecified: Secondary | ICD-10-CM

## 2018-06-28 LAB — CBC
HCT: 24.7 % — ABNORMAL LOW (ref 39.0–52.0)
Hemoglobin: 7.9 g/dL — ABNORMAL LOW (ref 13.0–17.0)
MCH: 24.2 pg — ABNORMAL LOW (ref 26.0–34.0)
MCHC: 32 g/dL (ref 30.0–36.0)
MCV: 75.8 fL — ABNORMAL LOW (ref 80.0–100.0)
Platelets: 297 10*3/uL (ref 150–400)
RBC: 3.26 MIL/uL — ABNORMAL LOW (ref 4.22–5.81)
RDW: 18 % — ABNORMAL HIGH (ref 11.5–15.5)
WBC: 9.7 10*3/uL (ref 4.0–10.5)
nRBC: 0 % (ref 0.0–0.2)

## 2018-06-28 LAB — IRON AND TIBC
Iron: 15 ug/dL — ABNORMAL LOW (ref 45–182)
Saturation Ratios: 7 % — ABNORMAL LOW (ref 17.9–39.5)
TIBC: 227 ug/dL — ABNORMAL LOW (ref 250–450)
UIBC: 212 ug/dL

## 2018-06-28 LAB — BASIC METABOLIC PANEL
Anion gap: 11 (ref 5–15)
BUN: 18 mg/dL (ref 8–23)
CO2: 24 mmol/L (ref 22–32)
Calcium: 9 mg/dL (ref 8.9–10.3)
Chloride: 99 mmol/L (ref 98–111)
Creatinine, Ser: 1.41 mg/dL — ABNORMAL HIGH (ref 0.61–1.24)
GFR calc Af Amer: 59 mL/min — ABNORMAL LOW (ref >60)
GFR calc non Af Amer: 51 mL/min — ABNORMAL LOW (ref >60)
Glucose, Bld: 87 mg/dL (ref 70–99)
Potassium: 4 mmol/L (ref 3.5–5.1)
Sodium: 134 mmol/L — ABNORMAL LOW (ref 135–145)

## 2018-06-28 LAB — SEDIMENTATION RATE: Sed Rate: 135 mm/hr — ABNORMAL HIGH (ref 0–16)

## 2018-06-28 LAB — C-REACTIVE PROTEIN: CRP: 15.3 mg/dL — ABNORMAL HIGH (ref <1.0)

## 2018-06-28 LAB — GLUCOSE, CAPILLARY
Glucose-Capillary: 96 mg/dL (ref 70–99)
Glucose-Capillary: 137 mg/dL — ABNORMAL HIGH (ref 70–99)
Glucose-Capillary: 105 mg/dL — ABNORMAL HIGH (ref 70–99)
Glucose-Capillary: 162 mg/dL — ABNORMAL HIGH (ref 70–99)

## 2018-06-28 LAB — FERRITIN: Ferritin: 179 ng/mL (ref 24–336)

## 2018-06-28 LAB — PREALBUMIN: Prealbumin: 11.1 mg/dL — ABNORMAL LOW (ref 18–38)

## 2018-06-28 LAB — HEMOGLOBIN A1C
Hgb A1c MFr Bld: 6.5 % — ABNORMAL HIGH (ref 4.8–5.6)
Mean Plasma Glucose: 139.85 mg/dL

## 2018-06-28 LAB — HIV ANTIBODY (ROUTINE TESTING W REFLEX): HIV Screen 4th Generation wRfx: NONREACTIVE

## 2018-06-28 MED ORDER — GLUCERNA SHAKE PO LIQD
237.0000 mL | Freq: Two times a day (BID) | ORAL | Status: DC
  Administered 2018-06-29 – 2018-07-10 (×18): 237 mL via ORAL

## 2018-06-28 MED ORDER — SODIUM CHLORIDE 0.9 % IV SOLN
2.0000 g | INTRAVENOUS | Status: AC
  Administered 2018-06-28 – 2018-07-02 (×4): 2 g via INTRAVENOUS
  Filled 2018-06-28 (×5): qty 20

## 2018-06-28 MED ORDER — METRONIDAZOLE IN NACL 5-0.79 MG/ML-% IV SOLN
500.0000 mg | Freq: Three times a day (TID) | INTRAVENOUS | Status: AC
  Administered 2018-06-28 – 2018-07-02 (×12): 500 mg via INTRAVENOUS
  Filled 2018-06-28 (×13): qty 100

## 2018-06-28 MED ORDER — PRO-STAT SUGAR FREE PO LIQD
30.0000 mL | Freq: Every day | ORAL | Status: DC
  Administered 2018-06-29 – 2018-07-10 (×10): 30 mL via ORAL
  Filled 2018-06-28 (×11): qty 30

## 2018-06-28 MED ORDER — VANCOMYCIN HCL 10 G IV SOLR
1500.0000 mg | INTRAVENOUS | Status: DC
  Administered 2018-06-29 – 2018-06-30 (×2): 1500 mg via INTRAVENOUS
  Filled 2018-06-28 (×5): qty 1500

## 2018-06-28 NOTE — Progress Notes (Signed)
Initial Nutrition Assessment  DOCUMENTATION CODES:   Obesity unspecified  INTERVENTION:  Provide Glucerna Shake po BID, each supplement provides 220 kcal and 10 grams of protein.  Provide 30 ml Prostat po once daily, each supplement provides 100 kcal and 15 grams of protein.   Encourage adequate PO intake.   NUTRITION DIAGNOSIS:   Increased nutrient needs related to wound healing as evidenced by estimated needs.  GOAL:   Patient will meet greater than or equal to 90% of their needs  MONITOR:   PO intake, Supplement acceptance, Labs, Weight trends, I & O's, Skin  REASON FOR ASSESSMENT:   Consult Wound healing  ASSESSMENT:   68 y.o. M with hx gout, HTN, IDDM on insulin pump with retinopathy, CKD III baseline 1.5-1.7, PVD s/p recent LLE stenting c/b post-stent thrombosis, and OSA on CPAP who presents with left foot pain, redness and swelling. Pt with gangrene of L 3rd toe and 5th toe  Meal completion has been 100%. Pt reports having a good appetite currently and PTA with no other difficulties. Noted pt with a 9% weight loss in 2 months per weight records. Pt unaware of weight loss. Pt is agreeable to nutritional supplement to aid in wound healing. RD to order.   Labs and medications reviewed.    NUTRITION - FOCUSED PHYSICAL EXAM:    Most Recent Value  Orbital Region  No depletion  Upper Arm Region  No depletion  Thoracic and Lumbar Region  No depletion  Buccal Region  No depletion  Temple Region  No depletion  Clavicle Bone Region  No depletion  Clavicle and Acromion Bone Region  No depletion  Scapular Bone Region  Unable to assess  Dorsal Hand  No depletion  Patellar Region  No depletion  Anterior Thigh Region  No depletion  Posterior Calf Region  No depletion  Edema (RD Assessment)  Mild  Hair  Reviewed  Eyes  Reviewed  Mouth  Reviewed  Skin  Reviewed  Nails  Reviewed       Diet Order:   Diet Order            Diet Carb Modified Fluid consistency: Thin;  Room service appropriate? Yes  Diet effective now              EDUCATION NEEDS:   Not appropriate for education at this time  Skin:  Skin Assessment: Skin Integrity Issues: Skin Integrity Issues:: Other (Comment) Other: Gangrene L 3rd and 5th toe  Last BM:  1/1  Height:   Ht Readings from Last 1 Encounters:  06/27/18 6\' 1"  (1.854 m)    Weight:   Wt Readings from Last 1 Encounters:  06/27/18 104.3 kg    Ideal Body Weight:  83.6 kg  BMI:  Body mass index is 30.34 kg/m.  Estimated Nutritional Needs:   Kcal:  2100-2300  Protein:  110-130 grams  Fluid:  2.1 - 2.3 L/day    Corrin Parker, MS, RD, LDN Pager # [phone] After hours/ weekend pager # [phone]
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
## 2018-06-28 NOTE — Progress Notes (Signed)
PROGRESS NOTE    KEATYN JAWAD   EGB:[id number]  DOB: 05/13/1951  DOA: 06/27/2018 PCP: [name], MD   Brief Narrative:  MAZI BRAILSFORD  is a 68 y.o. M with hx gout, HTN, IDDM on insulin pump with retinopathy, CKD III baseline 1.5-1.7, PVD s/p recent LLE stenting c/b post-stent thrombosis, and OSA on CPAP who presents with left foot pain, redness and swelling.   The patient was treated for restenosis of his left SFA in Oct complicated by in stent thrombosis and requiring 2 repeat angiograms for stenting. 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 Two days ago he was seen in wound care, had a new ulcer on the left THIRD digit, was started on Levaquin which he has taken. On the day of admission, this third digit was completely purple, so he was sent from wound care center for IV antibiotics.   In ED> WBC 11.2, no fever - started on Vanc/ Flagyl/ Ceftriaxone by admitting doctor.  Dr [name] plans on an angiogram on Monday.   Subjective:  the patient has no complaints.  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 toe (chronic) / PVD - cont prior to hospital meds> Plavix, statin, Xarelto - cardiology managing PVD and will evaluate patient later today-  - cont antibiotics for now  Active Problems:    DM (diabetes mellitus)  - A1c 6.5 - cont Lantus, mealtime insulin and ISS- his insulin pump is off - Metformin and Victoza on hold    HTN (hypertension) - on Coreg, Cozaar and Triamterene/HCTZ  Gout - cont Allopurinol and Colchicine    Anemia - Hb drop from 9.2 to 7.9 today it appears that ~  8 is actually his baseline thus Hb is stable-   -  Iron panel suggestive of AOCD    CKD (chronic kidney disease) stage 3, GFR 30-59 ml/min - baseline Cr 1.5- 1.7 - he is at baseline    Time spent in minutes: 30 DVT prophylaxis: Xarelto Code Status: Full code Family Communication:  Disposition Plan: per  cardiology Consultants:   Cardiology Procedures:   none Antimicrobials:  Anti-infectives (From admission, onward)   Start     Dose/Rate Route Frequency Ordered Stop   06/28/18 1000  vancomycin (VANCOCIN) 1,500 mg in sodium chloride 0.9 % 500 mL IVPB     1,500 mg 250 mL/hr over 120 Minutes Intravenous Every 24 hours 06/27/18 1536     06/27/18 1500  cefTRIAXone (ROCEPHIN) 2 g in sodium chloride 0.9 % 100 mL IVPB     2 g 200 mL/hr over 30 Minutes Intravenous Every 24 hours 06/27/18 1414     06/27/18 1500  metroNIDAZOLE (FLAGYL) IVPB 500 mg     500 mg 100 mL/hr over 60 Minutes Intravenous Every 8 hours 06/27/18 1414     06/27/18 1015  vancomycin (VANCOCIN) 2,000 mg in sodium chloride 0.9 % 500 mL IVPB     2,000 mg 250 mL/hr over 120 Minutes Intravenous  Once 06/27/18 1013 06/27/18 1247       Objective: Vitals:   06/27/18 1524 06/27/18 1954 06/28/18 0031 06/28/18 0354  BP: (!) 142/90 114/71  121/78  Pulse: 84 75 81 67  Resp: 20 16 18    Temp: 98.7 F (37.1 C) 98.6 F (37 C)  98.2 F (36.8 C)  TempSrc: Oral Oral  Oral  SpO2: 100% 95% 95% 99%  Weight:  Height:        Intake/Output Summary (Last 24 hours) at 06/28/2018 1433 Last data filed at 06/28/2018 0900 Gross per 24 hour  Intake 660.1 ml  Output 1630 ml  Net -969.9 ml   Filed Weights   06/27/18 0857  Weight: 104.3 kg    Examination: General exam: Appears comfortable  HEENT: PERRLA, oral mucosa moist, no sclera icterus or thrush Respiratory system: Clear to auscultation. Respiratory effort normal. Cardiovascular system: S1 & S2 heard, RRR.   Gastrointestinal system: Abdomen soft, non-tender, nondistended. Normal bowel sounds. Central nervous system: Alert and oriented. No focal neurological deficits. Extremities: No cyanosis, clubbing or edema Skin: foul smelling gangrenous left 3rd toe and left 5th toe Psychiatry:  Mood & affect appropriate.     Data Reviewed: I have personally reviewed following labs  and imaging studies  CBC: Recent Labs  Lab 06/27/18 1045 06/28/18 0248  WBC 11.2* 9.7  NEUTROABS 9.6*  --   HGB 9.2* 7.9*  HCT 30.0* 24.7*  MCV 76.9* 75.8*  PLT 326 329   Basic Metabolic Panel: Recent Labs  Lab 06/27/18 1045 06/28/18 0248  NA 134* 134*  K 3.7 4.0  CL 96* 99  CO2 25 24  GLUCOSE 133* 87  BUN 25* 18  CREATININE 1.64* 1.41*  CALCIUM 9.4 9.0   GFR: Estimated Creatinine Clearance: 64.5 mL/min (A) (by C-G formula based on SCr of 1.41 mg/dL (H)). Liver Function Tests: No results for input(s): AST, ALT, ALKPHOS, BILITOT, PROT, ALBUMIN in the last 168 hours. No results for input(s): LIPASE, AMYLASE in the last 168 hours. No results for input(s): AMMONIA in the last 168 hours. Coagulation Profile: No results for input(s): INR, PROTIME in the last 168 hours. Cardiac Enzymes: No results for input(s): CKTOTAL, CKMB, CKMBINDEX, TROPONINI in the last 168 hours. BNP (last 3 results) No results for input(s): PROBNP in the last 8760 hours. HbA1C: Recent Labs    06/28/18 0248  HGBA1C 6.5*   CBG: Recent Labs  Lab 06/27/18 2037 06/27/18 2118 06/27/18 2327 06/28/18 0724 06/28/18 1122  GLUCAP 70 74 114* 96 137*   Lipid Profile: No results for input(s): CHOL, HDL, LDLCALC, TRIG, CHOLHDL, LDLDIRECT in the last 72 hours. Thyroid Function Tests: No results for input(s): TSH, T4TOTAL, FREET4, T3FREE, THYROIDAB in the last 72 hours. Anemia Panel: Recent Labs    06/28/18 0248  FERRITIN 179  TIBC 227*  IRON 15*   Urine analysis:    Component Value Date/Time   COLORURINE YELLOW 04/29/2018 0820   APPEARANCEUR CLEAR 04/29/2018 0820   LABSPEC 1.013 04/29/2018 0820   PHURINE 5.0 04/29/2018 0820   GLUCOSEU 50 (A) 04/29/2018 0820   HGBUR NEGATIVE 04/29/2018 0820   BILIRUBINUR NEGATIVE 04/29/2018 0820   KETONESUR NEGATIVE 04/29/2018 0820   PROTEINUR NEGATIVE 04/29/2018 0820   UROBILINOGEN 0.2 12/01/2012 2135   NITRITE NEGATIVE 04/29/2018 0820   LEUKOCYTESUR  NEGATIVE 04/29/2018 0820   Sepsis Labs: @LABRCNTIP (procalcitonin:4,lacticidven:4) ) Recent Results (from the past 240 hour(s))  MRSA PCR Screening     Status: Abnormal   Collection Time: 06/27/18  4:06 PM  Result Value Ref Range Status   MRSA by PCR POSITIVE (A) NEGATIVE Final    Comment:        The GeneXpert MRSA Assay (FDA approved for NASAL specimens only), is one component of a comprehensive MRSA colonization surveillance program. It is not intended to diagnose MRSA infection nor to guide or monitor treatment for MRSA infections. RESULT CALLED TO, READ BACK BY AND VERIFIED  WITHConception Chancy RN [id number] 1902 BY GF Performed at [facility], [place] [address], [place], [place] [address]          Radiology Studies: No results found.    Scheduled Meds: . allopurinol  100 mg Oral Daily  . carvedilol  12.5 mg Oral Q1400  . Chlorhexidine Gluconate Cloth  6 each Topical Q0600  . clopidogrel  75 mg Oral Daily  . colchicine  0.6 mg Oral BID  . dorzolamide-timolol  1 drop Both Eyes BID  . insulin aspart  0-15 Units Subcutaneous TID WC  . insulin aspart  0-5 Units Subcutaneous QHS  . insulin aspart  3 Units Subcutaneous TID WC  . insulin glargine  15 Units Subcutaneous QHS  . latanoprost  1 drop Both Eyes QHS  . losartan  100 mg Oral Q1400  . mupirocin ointment  1 application Nasal BID  . pravastatin  40 mg Oral QHS  . pregabalin  50-100 mg Oral QHS  . rivaroxaban  2.5 mg Oral BID  . triamterene-hydrochlorothiazide  1 tablet Oral Q1400   Continuous Infusions: . cefTRIAXone (ROCEPHIN)  IV 2 g (06/27/18 1734)  . metronidazole 500 mg (06/28/18 0726)  . vancomycin 1,500 mg (06/28/18 0857)     LOS: 1 day      Debbe Odea, MD [facility] Pager: [email or web address] Password [facility] 06/28/2018, 2:33 PM

## 2018-06-29 ENCOUNTER — Inpatient Hospital Stay (HOSPITAL_COMMUNITY): Payer: BC Managed Care – PPO

## 2018-06-29 LAB — CBC
HCT: 26 % — ABNORMAL LOW (ref 39.0–52.0)
Hemoglobin: 8.2 g/dL — ABNORMAL LOW (ref 13.0–17.0)
MCH: 23.6 pg — ABNORMAL LOW (ref 26.0–34.0)
MCHC: 31.5 g/dL (ref 30.0–36.0)
MCV: 74.7 fL — ABNORMAL LOW (ref 80.0–100.0)
Platelets: 315 10*3/uL (ref 150–400)
RBC: 3.48 MIL/uL — ABNORMAL LOW (ref 4.22–5.81)
RDW: 18.3 % — ABNORMAL HIGH (ref 11.5–15.5)
WBC: 9.9 10*3/uL (ref 4.0–10.5)
nRBC: 0 % (ref 0.0–0.2)

## 2018-06-29 LAB — GLUCOSE, CAPILLARY
Glucose-Capillary: 123 mg/dL — ABNORMAL HIGH (ref 70–99)
Glucose-Capillary: 159 mg/dL — ABNORMAL HIGH (ref 70–99)
Glucose-Capillary: 151 mg/dL — ABNORMAL HIGH (ref 70–99)

## 2018-06-29 LAB — BASIC METABOLIC PANEL
Anion gap: 10 (ref 5–15)
BUN: 17 mg/dL (ref 8–23)
CO2: 23 mmol/L (ref 22–32)
Calcium: 9 mg/dL (ref 8.9–10.3)
Chloride: 99 mmol/L (ref 98–111)
Creatinine, Ser: 1.29 mg/dL — ABNORMAL HIGH (ref 0.61–1.24)
GFR calc Af Amer: >60 mL/min (ref >60)
GFR calc non Af Amer: 57 mL/min — ABNORMAL LOW (ref >60)
Glucose, Bld: 178 mg/dL — ABNORMAL HIGH (ref 70–99)
Potassium: 3.9 mmol/L (ref 3.5–5.1)
Sodium: 132 mmol/L — ABNORMAL LOW (ref 135–145)

## 2018-06-29 LAB — HEPARIN LEVEL (UNFRACTIONATED): Heparin Unfractionated: 0.65 IU/mL (ref 0.30–0.70)

## 2018-06-29 MED ORDER — HEPARIN (PORCINE) 25000 UT/250ML-% IV SOLN
1600.0000 [IU]/h | INTRAVENOUS | Status: DC
  Administered 2018-06-29: 1250 [IU]/h via INTRAVENOUS
  Administered 2018-06-30: 1400 [IU]/h via INTRAVENOUS
  Filled 2018-06-29 (×2): qty 250

## 2018-06-29 MED ORDER — RIVAROXABAN 15 MG PO TABS
15.0000 mg | ORAL_TABLET | Freq: Two times a day (BID) | ORAL | Status: DC
  Filled 2018-06-29: qty 1

## 2018-06-29 MED ORDER — HEPARIN BOLUS VIA INFUSION
2000.0000 [IU] | Freq: Once | INTRAVENOUS | Status: AC
  Administered 2018-06-29: 2000 [IU] via INTRAVENOUS
  Filled 2018-06-29: qty 2000

## 2018-06-29 NOTE — Progress Notes (Signed)
[facility]                                                                              Patient Demographics  [name], is a 68 y.o. male, DOB - [birth year], IRC:[id number]  Admit date - 06/27/2018   Admitting Physician [name] Dada, MD  Outpatient Primary MD for the patient is [name] Panda, MD  Outpatient specialists:   LOS - 2  days   Medical records reviewed and are as summarized below:    Chief Complaint  Patient presents with  . Foot Pain  . 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  The patient was treated for restenosis of his left SFA in Oct complicated by in stent thrombosis and requiring 2 repeat angiograms for stenting. 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 Two days ago he was seen in wound care, had a new ulcer on the left THIRD digit, was started on Levaquin which he has taken. On the day of admission, this third digit was completely purple, so he was sent from wound care center for IV antibiotics.     Assessment & Plan   Gangrene, left third toe, new and left fifth toe, chronic with severe PVD -Continue IV antibiotics -Patient seen by cardiology who had been managing his PVD, -ABIs that showed occluded stents, ordered left foot MRI   Diabetes mellitus uncontrolled with diabetic foot ulcer, IDDM -Hemoglobin A1c 6.5 -Continue Lantus, sliding scale insulin with meal coverage, insulin pump off -CBGs stable  Essential hypertension -Continue Coreg, Cozaar, triamterene/HCTZ  Gout - continue allopurinol, colchicine  Chronic anemia, likely due to PVD -Hemoglobin 8.9, baseline appears to be around 8 -Anemia panel suggestive of anemia of chronic  disease  CKD stage III -Baseline creatinine 1.5-1.7, currently at baseline  Code Status: Full CODE STATUS DVT Prophylaxis: On Xarelto Family Communication: Discussed in detail with the patient, all imaging results, lab results explained to the patient     Disposition Plan: Await left foot MRI, cardiology recommendations, may need TMA and vascular surgery  Time Spent in minutes   35 minutes  Procedures:  None  Consultants:   Cardiology  Antimicrobials:      Medications  Scheduled Meds: . allopurinol  100 mg Oral Daily  . carvedilol  12.5 mg Oral Q1400  . Chlorhexidine Gluconate Cloth  6 each Topical Q0600  . clopidogrel  75 mg Oral Daily  . colchicine  0.6 mg Oral BID  . dorzolamide-timolol  1 drop Both Eyes BID  . feeding supplement (GLUCERNA SHAKE)  237 mL Oral BID BM  . feeding supplement (PRO-STAT SUGAR FREE 64)  30 mL Oral Daily  . insulin aspart  0-15 Units Subcutaneous TID WC  . insulin aspart  0-5 Units Subcutaneous QHS  . insulin aspart  3 Units Subcutaneous TID WC  . insulin glargine  15 Units Subcutaneous QHS  . latanoprost  1 drop Both Eyes QHS  . losartan  100 mg Oral Q1400  . mupirocin ointment  1 application Nasal BID  . pravastatin  40 mg Oral QHS  . pregabalin  50-100 mg Oral QHS  . Rivaroxaban  15 mg Oral BID WC  . triamterene-hydrochlorothiazide  1 tablet Oral Q1400   Continuous Infusions: . cefTRIAXone (ROCEPHIN)  IV 2 g (06/28/18 1858)  . metronidazole 500 mg (06/29/18 1342)  . vancomycin 1,500 mg (06/29/18 0855)   PRN Meds:.acetaminophen **OR** acetaminophen, ondansetron **OR** ondansetron (ZOFRAN) IV, oxyCODONE-acetaminophen   Antibiotics   Anti-infectives (From admission, onward)   Start     Dose/Rate Route Frequency Ordered Stop   06/29/18 0900  vancomycin (VANCOCIN) 1,500 mg in sodium chloride 0.9 % 500 mL IVPB     1,500 mg 250 mL/hr over 120 Minutes Intravenous Every 24 hours 06/28/18 1443     06/28/18 1500  metroNIDAZOLE  (FLAGYL) IVPB 500 mg     500 mg 100 mL/hr over 60 Minutes Intravenous Every 8 hours 06/28/18 1443     06/28/18 1500  cefTRIAXone (ROCEPHIN) 2 g in sodium chloride 0.9 % 100 mL IVPB     2 g 200 mL/hr over 30 Minutes Intravenous Every 24 hours 06/28/18 1443     06/28/18 1000  vancomycin (VANCOCIN) 1,500 mg in sodium chloride 0.9 % 500 mL IVPB  Status:  Discontinued     1,500 mg 250 mL/hr over 120 Minutes Intravenous Every 24 hours 06/27/18 1536 06/28/18 1435   06/27/18 1500  cefTRIAXone (ROCEPHIN) 2 g in sodium chloride 0.9 % 100 mL IVPB  Status:  Discontinued     2 g 200 mL/hr over 30 Minutes Intravenous Every 24 hours 06/27/18 1414 06/28/18 1435   06/27/18 1500  metroNIDAZOLE (FLAGYL) IVPB 500 mg  Status:  Discontinued     500 mg 100 mL/hr over 60 Minutes Intravenous Every 8 hours 06/27/18 1414 06/28/18 1435   06/27/18 1015  vancomycin (VANCOCIN) 2,000 mg in sodium chloride 0.9 % 500 mL IVPB     2,000 mg 250 mL/hr over 120 Minutes Intravenous  Once 06/27/18 1013 06/27/18 1247        Subjective:   [name] was seen and examined today.  Pain in the left foot 6/10, no fevers or chills, somewhat frustrated.   Patient denies dizziness, chest pain, shortness of breath, abdominal pain, N/V/D/C.  Low-grade temp of 99.6 F.  No acute events overnight.    Objective:   Vitals:   06/28/18 1436 06/28/18 1955 06/29/18 0532 06/29/18 1335  BP: 116/69 109/60 119/80 140/79  Pulse: 71 72 71 66  Resp: 16 18 19 16   Temp: 98.6 F (37 C) 99.5 F (37.5 C) 99.6 F (37.6 C) 99.4 F (37.4 C)  TempSrc: Oral Oral Oral Oral  SpO2: 98% 100% 98% 100%  Weight:      Height:        Intake/Output Summary (Last 24 hours) at 06/29/2018 1402 Last data filed at 06/29/2018 0900 Gross per 24 hour  Intake 817.62 ml  Output 1430 ml  Net -612.38 ml     Wt Readings from Last 3 Encounters:  06/27/18 104.3 kg  05/05/18 114.8 kg  04/22/18 113.4 kg     Exam  General: Alert and oriented x 3,  NAD  Eyes:  HEENT:  Atraumatic, normocephalic, normal oropharynx  Cardiovascular: S1 S2 auscultated,  Regular rate and rhythm.  Respiratory: Clear to auscultation bilaterally, no wheezing,  rales or rhonchi  Gastrointestinal: Soft, nontender, nondistended, + bowel sounds  Ext: Dressing intact on the left leg  Neuro: No new deficits  Musculoskeletal: No digital cyanosis, clubbing  Skin: No rashes  Psych: Normal affect and demeanor, alert and oriented x3    Data Reviewed:  I have personally reviewed following labs and imaging studies  Micro Results Recent Results (from the past 240 hour(s))  MRSA PCR Screening     Status: Abnormal   Collection Time: 06/27/18  4:06 PM  Result Value Ref Range Status   MRSA by PCR POSITIVE (A) NEGATIVE Final    Comment:        The GeneXpert MRSA Assay (FDA approved for NASAL specimens only), is one component of a comprehensive MRSA colonization surveillance program. It is not intended to diagnose MRSA infection nor to guide or monitor treatment for MRSA infections. RESULT CALLED TO, READ BACK BY AND VERIFIED WITHConception Chancy RN [phone] BY GF Performed at [facility], [place] [address], [place], [place] [address]     Radiology Reports Mr Foot Left Wo Contrast  Result Date: 06/29/2018 CLINICAL DATA:  Diabetic patient with a skin ulceration on the third toe of the left foot. Left foot pain, redness and swelling. The patient's little toe appears necrotic. EXAM: MRI OF THE LEFT FOOT WITHOUT CONTRAST TECHNIQUE: Multiplanar, multisequence MR imaging of the left foot was performed. No intravenous contrast was administered. COMPARISON:  Plain films left foot 06/23/2018. FINDINGS: Bones/Joint/Cartilage Markedly decreased T1 and T2 signal throughout the distal phalanx of the little toe are most consistent with osteonecrosis. There is increased T2 signal throughout the proximal phalanx of the great toe consistent with osteomyelitis. The distal  phalanx of the third toe is not well seen but there appears to be bony destructive change of the tuft. Innumerable punctate foci of decreased T1 and T2 signal in soft tissues about the distal phalanx are consistent with the presence of gas. The third toe is otherwise normal in appearance. Mild hallux valgus and mild first MTP osteoarthritis are seen. Imaged bones otherwise appear normal. Ligaments Intact. Muscles and Tendons Marked atrophy of intrinsic musculature the foot is identified. No intramuscular fluid collection. Soft tissues Diffuse subcutaneous edema is present about the dorsum of the foot. IMPRESSION: Markedly decreased T1 and T2 signal throughout the distal phalanx of the little toe likely due to osteonecrosis. Marrow edema in the proximal phalanx of the fifth toe is consistent with osteomyelitis. Bony destructive change in the tuft of the third toe consistent with osteomyelitis. No other evidence of osteomyelitis is identified. Soft tissue gas about the distal phalanx of the third toe consistent with skin ulceration and cellulitis. Negative for abscess or septic joint. Diffuse subcutaneous edema about the foot most consistent with cellulitis. Electronically Signed   By: Inge Rise M.D.   On: 06/29/2018 13:09   Dg Foot Complete Left  Result Date: 06/23/2018 CLINICAL DATA:  Left foot ulcers. EXAM: LEFT FOOT - COMPLETE 3+ VIEW COMPARISON:  None. FINDINGS: Old distal left fifth metatarsal fracture is noted. Large soft tissue ulceration is seen involving distal portion of third toe with underlying lytic destruction of the distal tuft of third distal phalanx consistent with osteomyelitis. Joint spaces are intact. IMPRESSION: Soft tissue ulceration involving distal soft tissues of third toe with underlying lytic destruction of distal tuft of third distal phalanx consistent with osteomyelitis. Electronically Signed   By: Marijo Conception, M.D.   On: 06/23/2018 15:54   Vas  [place] Lower Extremity  Arterial Duplex  Result Date: 06/29/2018 LOWER EXTREMITY ARTERIAL DUPLEX STUDY Indications: Unsuccessful revascularization 05/09/18 previous SFA distal and              popliteal stenting with stent thrombosis.  Current ABI: Previous ABI 05/02/18 Right 0.82 Left 0.43 Performing Technologist: June Leap RDMS, RVT  Examination Guidelines: A complete evaluation includes B-mode imaging, spectral Doppler, color Doppler, and power Doppler as needed of all accessible portions of each vessel. Bilateral testing is considered an integral part of a complete examination. Limited examinations for reoccurring indications may be performed as noted.  Left Duplex Findings: +-----------+--------+-----+--------+----------+-------------------------------+            PSV cm/sRatioStenosisWaveform  Comments                        +-----------+--------+-----+--------+----------+-------------------------------+ CFA Prox   89                   biphasic                                  +-----------+--------+-----+--------+----------+-------------------------------+ DFA        62                   biphasic                                  +-----------+--------+-----+--------+----------+-------------------------------+ SFA Prox   101                  biphasic                                  +-----------+--------+-----+--------+----------+-------------------------------+ SFA Mid    74                   biphasic                                  +-----------+--------+-----+--------+----------+-------------------------------+ SFA Distal 52                   biphasic  proximal part of stent is                                                 patent, collateral vessel is                                              noted just above where stent                                              occludes                         +-----------+--------+-----+--------+----------+-------------------------------+ POP Prox  occluded stent                  +-----------+--------+-----+--------+----------+-------------------------------+ POP Distal                                occluded stent                  +-----------+--------+-----+--------+----------+-------------------------------+ ATA Prox   44                   monophasic                                +-----------+--------+-----+--------+----------+-------------------------------+ ATA Mid                                   unable to image due to bandages +-----------+--------+-----+--------+----------+-------------------------------+ PTA Mid                         monophasic                                +-----------+--------+-----+--------+----------+-------------------------------+ PTA Distal 41                   monophasic                                +-----------+--------+-----+--------+----------+-------------------------------+ PERO Mid                        occluded                                  +-----------+--------+-----+--------+----------+-------------------------------+ PERO Distal                     occluded                                  +-----------+--------+-----+--------+----------+-------------------------------+  Summary: Left: Left popliteal stent is occluded. Left peroneal artery is occluded. Details mentioned above. Incidental finding: left popliteal vein DVT.   See table(s) above for measurements and observations. Electronically signed by Ruta Hinds MD on 06/29/2018 at 12:35:35 PM.    Final     Lab Data:  CBC: Recent Labs  Lab 06/27/18 1045 06/28/18 0248 06/29/18 1119  WBC 11.2* 9.7 9.9  NEUTROABS 9.6*  --   --   HGB 9.2* 7.9* 8.2*  HCT 30.0* 24.7* 26.0*  MCV 76.9* 75.8* 74.7*  PLT 326 297 354   Basic Metabolic Panel: Recent Labs  Lab  06/27/18 1045 06/28/18 0248 06/29/18 1119  NA 134* 134* 132*  K 3.7 4.0 3.9  CL 96* 99 99  CO2 25 24 23   GLUCOSE 133* 87 178*  BUN 25* 18 17  CREATININE 1.64* 1.41* 1.29*  CALCIUM 9.4 9.0 9.0   GFR: Estimated Creatinine Clearance: 70.5 mL/min (A) (by C-G formula based on SCr of 1.29 mg/dL (H)). Liver Function Tests: No results for input(s): AST, ALT, ALKPHOS, BILITOT, PROT, ALBUMIN in the last 168 hours. No results for input(s): LIPASE, AMYLASE in  the last 168 hours. No results for input(s): AMMONIA in the last 168 hours. Coagulation Profile: No results for input(s): INR, PROTIME in the last 168 hours. Cardiac Enzymes: No results for input(s): CKTOTAL, CKMB, CKMBINDEX, TROPONINI in the last 168 hours. BNP (last 3 results) No results for input(s): PROBNP in the last 8760 hours. HbA1C: Recent Labs    06/28/18 0248  HGBA1C 6.5*   CBG: Recent Labs  Lab 06/28/18 1122 06/28/18 1628 06/28/18 2059 06/29/18 0626 06/29/18 1144  GLUCAP 137* 105* 162* 123* 159*   Lipid Profile: No results for input(s): CHOL, HDL, LDLCALC, TRIG, CHOLHDL, LDLDIRECT in the last 72 hours. Thyroid Function Tests: No results for input(s): TSH, T4TOTAL, FREET4, T3FREE, THYROIDAB in the last 72 hours. Anemia Panel: Recent Labs    06/28/18 0248  FERRITIN 179  TIBC 227*  IRON 15*   Urine analysis:    Component Value Date/Time   COLORURINE YELLOW 04/29/2018 0820   APPEARANCEUR CLEAR 04/29/2018 0820   LABSPEC 1.013 04/29/2018 0820   PHURINE 5.0 04/29/2018 0820   GLUCOSEU 50 (A) 04/29/2018 0820   HGBUR NEGATIVE 04/29/2018 0820   BILIRUBINUR NEGATIVE 04/29/2018 0820   KETONESUR NEGATIVE 04/29/2018 0820   PROTEINUR NEGATIVE 04/29/2018 0820   UROBILINOGEN 0.2 12/01/2012 2135   NITRITE NEGATIVE 04/29/2018 0820   LEUKOCYTESUR NEGATIVE 04/29/2018 0820     Ripudeep Rai M.D. [facility] 06/29/2018, 2:02 PM  Pager: [phone] Between 7am to 7pm - call Pager - [phone]  After 7pm go  to [email or web address] - password [email or web address]  Call night coverage person covering after 7pm

## 2018-06-29 NOTE — Plan of Care (Signed)
  Problem: Clinical Measurements: Goal: Will remain free from infection Outcome: Progressing   Problem: Pain Managment: Goal: General experience of comfort will improve Outcome: Progressing   Problem: Skin Integrity: Goal: Risk for impaired skin integrity will decrease Outcome: Progressing   

## 2018-06-29 NOTE — Discharge Instructions (Addendum)
Information on my medicine - XARELTO (rivaroxaban)   WHY WAS XARELTO PRESCRIBED FOR YOU? Xarelto was prescribed to treat blood clots that may have been found in the veins of your legs (deep vein thrombosis) or in your lungs (pulmonary embolism) and to reduce the risk of them occurring again.  What do you need to know about Xarelto? The starting dose is one 15 mg tablet taken TWICE daily with food for the FIRST 21 DAYS then  the dose is changed to one 20 mg tablet taken ONCE A DAY with your evening meal.  DO NOT stop taking Xarelto without talking to the health care provider who prescribed the medication.  Refill your prescription for 20 mg tablets before you run out.  After discharge, you should have regular check-up appointments with your healthcare provider that is prescribing your Xarelto.  In the future your dose may need to be changed if your kidney function changes by a significant amount.  What do you do if you miss a dose? If you are taking Xarelto TWICE DAILY and you miss a dose, take it as soon as you remember. You may take two 15 mg tablets (total 30 mg) at the same time then resume your regularly scheduled 15 mg twice daily the next day.  If you are taking Xarelto ONCE DAILY and you miss a dose, take it as soon as you remember on the same day then continue your regularly scheduled once daily regimen the next day. Do not take two doses of Xarelto at the same time.   Important Safety Information Xarelto is a blood thinner medicine that can cause bleeding. You should call your healthcare provider right away if you experience any of the following: ? Bleeding from an injury or your nose that does not stop. ? Unusual colored urine (red or dark brown) or unusual colored stools (red or black). ? Unusual bruising for unknown reasons. ? A serious fall or if you hit your head (even if there is no bleeding).  Some medicines may interact with Xarelto and might increase your risk of  bleeding while on Xarelto. To help avoid this, consult your healthcare provider or pharmacist prior to using any new prescription or non-prescription medications, including herbals, vitamins, non-steroidal anti-inflammatory drugs (NSAIDs) and supplements.  This website has more information on Xarelto: [email or web address]    [facility]  Discharge instructions  Lower Extremity Bypass Surgery  Please refer to the following instruction for your post-procedure care. Your surgeon or physician assistant will discuss any changes with you.  Activity  You are encouraged to walk as much as you can. You can slowly return to normal activities during the month after your surgery. Avoid strenuous activity and heavy lifting until your doctor tells you it's OK. Avoid activities such as vacuuming or swinging a golf club. Do not drive until your doctor give the OK and you are no longer taking prescription pain medications. It is also normal to have difficulty with sleep habits, eating and bowel movement after surgery. These will go away with time.  Bathing/Showering  Shower daily after you go home. Do not soak in a bathtub, hot tub, or swim until the incision heals completely.  Incision Care  Clean your incision with mild soap and water. Shower every day. Pat the area dry with a clean towel. You do not need a bandage unless otherwise instructed. Do not apply any ointments or creams to your incision. If you have open wounds you will  be instructed how to care for them or a visiting nurse may be arranged for you. If you have staples or sutures along your incision they will be removed at your post-op appointment. You may have skin glue on your incision. Do not peel it off. It will come off on its own in about one week.  Wash the groin wound with soap and water daily and pat dry. (No tub bath-only shower)  Then put a dry gauze or washcloth in the groin to keep this area dry to help  prevent wound infection.  Do this daily and as needed.  Do not use Vaseline or neosporin on your incisions.  Only use soap and water on your incisions and then protect and keep dry.  Diet  Resume your normal diet. There are no special food restrictions following this procedure. A low fat/ low cholesterol diet is recommended for all patients with vascular disease. In order to heal from your surgery, it is CRITICAL to get adequate nutrition. Your body requires vitamins, minerals, and protein. Vegetables are the best source of vitamins and minerals. Vegetables also provide the perfect balance of protein. Processed food has little nutritional value, so try to avoid this.  Medications  Resume taking all your medications unless your doctor or physician assistant tells you not to. If your incision is causing pain, you may take over-the-counter pain relievers such as acetaminophen (Tylenol). If you were prescribed a stronger pain medication, please aware these medication can cause nausea and constipation. Prevent nausea by taking the medication with a snack or meal. Avoid constipation by drinking plenty of fluids and eating foods with high amount of fiber, such as fruits, vegetables, and grains. Take Colace 100 mg (an over-the-counter stool softener) twice a day as needed for constipation.  Do not take Tylenol if you are taking prescription pain medications.  Follow Up  Our office will schedule a follow up appointment 2-3 weeks following discharge.  Please call us immediately for any of the following conditions  Severe or worsening pain in your legs or feet while at rest or while walking Increase pain, redness, warmth, or drainage (pus) from your incision site(s) Fever of 101 degree or higher The swelling in your leg with the bypass suddenly worsens and becomes more painful than when you were in the hospital If you have been instructed to feel your graft pulse then you should do so every day. If you  can no longer feel this pulse, call the office immediately. Not all patients are given this instruction.  Leg swelling is common after leg bypass surgery.  The swelling should improve over a few months following surgery. To improve the swelling, you may elevate your legs above the level of your heart while you are sitting or resting. Your surgeon or physician assistant may ask you to apply an ACE wrap or wear compression (TED) stockings to help to reduce swelling.  Reduce your risk of vascular disease  Stop smoking. If you would like help call [facility] at 1-800-QUIT-NOW 979-575-4622) or [place] at [phone].  Manage your cholesterol Maintain a desired weight Control your diabetes weight Control your diabetes Keep your blood pressure down  If you have any questions, please call the office at [phone]

## 2018-06-29 NOTE — Plan of Care (Signed)

## 2018-06-29 NOTE — Progress Notes (Signed)
  MRI left foot 06/29/2018: Markedly decreased T1 and T2 signal throughout the distal phalanx of the little toe likely due to osteonecrosis. Marrow edema in the proximal phalanx of the fifth toe is consistent with osteomyelitis.  Bony destructive change in the tuft of the third toe consistent with osteomyelitis. No other evidence of osteomyelitis is identified.  Soft tissue gas about the distal phalanx of the third toe consistent with skin ulceration and cellulitis.  Negative for abscess or septic joint.  Diffuse subcutaneous edema about the foot most consistent with Cellulitis.  Vascular LE [place] 06/28/2018: Left: Left popliteal stent is occluded. Left peroneal artery is occluded. Details mentioned above.  Incidental finding: left popliteal vein DVT.  In view of popliteal DVT, stop low dose Xarelto. Start IV heparin. Will discuss with vascular surgery.  Nigel Mormon, MD [facility] Pager: [phone] Office: [phone] If no answer Cell [phone]

## 2018-06-29 NOTE — Progress Notes (Addendum)
ANTICOAGULATION CONSULT NOTE - Follow Up Consult  Pharmacy Consult for Heparin Indication: DVT treatment  No Known Allergies  Vital Signs: Temp: 99.6 F (37.6 C) (01/05 0532) Temp Source: Oral (01/05 0532) BP: 119/80 (01/05 0532) Pulse Rate: 71 (01/05 0532)  Labs: Recent Labs    06/27/18 1045 06/28/18 0248 06/29/18 1119  HGB 9.2* 7.9* 8.2*  HCT 30.0* 24.7* 26.0*  PLT 326 297 315  CREATININE 1.64* 1.41* 1.29*    Estimated Creatinine Clearance: 70.5 mL/min (A) (by C-G formula based on SCr of 1.29 mg/dL (H)).  Assessment: 68 year old male to begin heparin for VTE treatment Only on Xarelto 2.5 mg po BID dosing  Goal of Therapy:  Monitor platelets by anticoagulation protocol: Yes  Heparin level = 0.3 to 0.7 PTT = 66 to 102 seconds   Plan:  Heparin 2000 units iv bolus x 1  Heparin drip at 1250 units / hr PTT and heparin level 8 hours after heparin starts Daily heparin level, CBC   Thank you Anette Guarneri, PharmD [phone]  06/29/2018,12:41 PM

## 2018-06-29 NOTE — Consult Note (Addendum)
Called by patient directly today requesting second opinion regarding his left foot.  He was admitted 2 days ago.  He also has a popliteal vein DVT and was converted from xarelto to heparin. Pt with prior popliteal stent and tibial intervention by Dr Nadyne Coombes. ABI was 0.4 pre procedure.  Atherectomy PTA 2/19 left SFA pop was done for a mixed venous calf ulcer.  2 vessel runoff AT peroneal at end of procedure.  The popliteal artery reoccluded and repeat agram 10/19 viabahn stent placed peroneal runoff with partial AT runoff but not to foot.  Stent occluded about 2 weeks later, additional proximal stent placed and TPA to AT. At that time AT runoff was lost leaving single vessel peroneal and ABI 0.4. Duplex shows occluded peroneal with monophasic PT and AT.  Pt now has returned with gangrenous changes in foot.  Discussed with pt we would need to repeat agram to see if there are any further options and I am doubtful these would be percutaneous.  Hopefully he has a potential distal bypass target.  Will eval pt tomorrow morning.  Will keep NPO in the event foot is salvageable to do agram tomorrow and possible bypass on Tuesday.  Ruta Hinds, MD [facility] Office: [phone] Pager: [phone]

## 2018-06-29 NOTE — Progress Notes (Signed)
Placed patient on CPAP via nasal mask, previous settings of 10.0 cm H20.  Patient tolerating well at this time.  

## 2018-06-30 ENCOUNTER — Inpatient Hospital Stay (HOSPITAL_COMMUNITY): Payer: BC Managed Care – PPO

## 2018-06-30 ENCOUNTER — Encounter (HOSPITAL_COMMUNITY)
Admission: EM | Disposition: A | Discharge status: Skilled Nursing Facility | Payer: Self-pay | Source: Home / Self Care | Attending: Internal Medicine

## 2018-06-30 ENCOUNTER — Encounter (HOSPITAL_BASED_OUTPATIENT_CLINIC_OR_DEPARTMENT_OTHER): Payer: BC Managed Care – PPO

## 2018-06-30 ENCOUNTER — Encounter (HOSPITAL_COMMUNITY): Payer: Self-pay | Admitting: Vascular Surgery

## 2018-06-30 DIAGNOSIS — I739 Peripheral vascular disease, unspecified: Secondary | ICD-10-CM

## 2018-06-30 DIAGNOSIS — L02612 Cutaneous abscess of left foot: Secondary | ICD-10-CM

## 2018-06-30 DIAGNOSIS — L03032 Cellulitis of left toe: Secondary | ICD-10-CM

## 2018-06-30 HISTORY — PX: LOWER EXTREMITY ANGIOGRAPHY: CATH118251

## 2018-06-30 LAB — CBC
HCT: 26.6 % — ABNORMAL LOW (ref 39.0–52.0)
Hemoglobin: 8.2 g/dL — ABNORMAL LOW (ref 13.0–17.0)
MCH: 22.9 pg — ABNORMAL LOW (ref 26.0–34.0)
MCHC: 30.8 g/dL (ref 30.0–36.0)
MCV: 74.3 fL — ABNORMAL LOW (ref 80.0–100.0)
Platelets: 314 10*3/uL (ref 150–400)
RBC: 3.58 MIL/uL — ABNORMAL LOW (ref 4.22–5.81)
RDW: 17.9 % — ABNORMAL HIGH (ref 11.5–15.5)
WBC: 9.9 10*3/uL (ref 4.0–10.5)
nRBC: 0 % (ref 0.0–0.2)

## 2018-06-30 LAB — BASIC METABOLIC PANEL
Anion gap: 9 (ref 5–15)
BUN: 17 mg/dL (ref 8–23)
CO2: 23 mmol/L (ref 22–32)
Calcium: 8.9 mg/dL (ref 8.9–10.3)
Chloride: 100 mmol/L (ref 98–111)
Creatinine, Ser: 1.31 mg/dL — ABNORMAL HIGH (ref 0.61–1.24)
GFR calc Af Amer: >60 mL/min (ref >60)
GFR calc non Af Amer: 56 mL/min — ABNORMAL LOW (ref >60)
Glucose, Bld: 143 mg/dL — ABNORMAL HIGH (ref 70–99)
Potassium: 3.8 mmol/L (ref 3.5–5.1)
Sodium: 132 mmol/L — ABNORMAL LOW (ref 135–145)

## 2018-06-30 LAB — GLUCOSE, CAPILLARY
Glucose-Capillary: 128 mg/dL — ABNORMAL HIGH (ref 70–99)
Glucose-Capillary: 132 mg/dL — ABNORMAL HIGH (ref 70–99)
Glucose-Capillary: 99 mg/dL (ref 70–99)
Glucose-Capillary: 187 mg/dL — ABNORMAL HIGH (ref 70–99)
Glucose-Capillary: 206 mg/dL — ABNORMAL HIGH (ref 70–99)
Glucose-Capillary: 165 mg/dL — ABNORMAL HIGH (ref 70–99)

## 2018-06-30 LAB — APTT
aPTT: 54 seconds — ABNORMAL HIGH (ref 24–36)
aPTT: 53 seconds — ABNORMAL HIGH (ref 24–36)

## 2018-06-30 LAB — HEPARIN LEVEL (UNFRACTIONATED): Heparin Unfractionated: 0.38 IU/mL (ref 0.30–0.70)

## 2018-06-30 SURGERY — LOWER EXTREMITY ANGIOGRAPHY
Anesthesia: LOCAL

## 2018-06-30 MED ORDER — ONDANSETRON HCL 4 MG/2ML IJ SOLN: 4.0000 mg | Freq: Four times a day (QID) | INTRAMUSCULAR | Status: DC | PRN

## 2018-06-30 MED ORDER — MIDAZOLAM HCL 2 MG/2ML IJ SOLN
INTRAMUSCULAR | Status: AC
  Filled 2018-06-30: qty 2

## 2018-06-30 MED ORDER — SODIUM CHLORIDE 0.9 % WEIGHT BASED INFUSION: 1.0000 mL/kg/h | INTRAVENOUS | Status: AC

## 2018-06-30 MED ORDER — HEPARIN (PORCINE) 25000 UT/250ML-% IV SOLN
1750.0000 [IU]/h | INTRAVENOUS | Status: DC
  Administered 2018-06-30: 1600 [IU]/h via INTRAVENOUS

## 2018-06-30 MED ORDER — HYDRALAZINE HCL 20 MG/ML IJ SOLN: 5.0000 mg | INTRAMUSCULAR | Status: DC | PRN

## 2018-06-30 MED ORDER — FENTANYL CITRATE (PF) 100 MCG/2ML IJ SOLN
INTRAMUSCULAR | Status: AC
  Filled 2018-06-30: qty 2

## 2018-06-30 MED ORDER — HEPARIN (PORCINE) IN NACL 1000-0.9 UT/500ML-% IV SOLN
INTRAVENOUS | Status: DC | PRN
  Administered 2018-06-30 (×2): 500 mL

## 2018-06-30 MED ORDER — ACETAMINOPHEN 325 MG PO TABS: 650.0000 mg | ORAL_TABLET | ORAL | Status: DC | PRN

## 2018-06-30 MED ORDER — IODIXANOL 320 MG/ML IV SOLN
INTRAVENOUS | Status: DC | PRN
  Administered 2018-06-30: 75 mL via INTRA_ARTERIAL

## 2018-06-30 MED ORDER — SODIUM CHLORIDE 0.9% FLUSH: 3.0000 mL | INTRAVENOUS | Status: DC | PRN

## 2018-06-30 MED ORDER — SODIUM CHLORIDE 0.9 % IV SOLN
250.0000 mL | INTRAVENOUS | Status: DC | PRN
  Administered 2018-07-01: 250 mL via INTRAVENOUS

## 2018-06-30 MED ORDER — LIDOCAINE HCL (PF) 1 % IJ SOLN
INTRAMUSCULAR | Status: DC | PRN
  Administered 2018-06-30: 20 mL via INTRADERMAL

## 2018-06-30 MED ORDER — LIDOCAINE HCL (PF) 1 % IJ SOLN
INTRAMUSCULAR | Status: AC
  Filled 2018-06-30: qty 30

## 2018-06-30 MED ORDER — SODIUM CHLORIDE 0.45 % IV SOLN
INTRAVENOUS | Status: DC
  Administered 2018-06-30: 10:00:00 via INTRAVENOUS

## 2018-06-30 MED ORDER — HEPARIN (PORCINE) IN NACL 1000-0.9 UT/500ML-% IV SOLN
INTRAVENOUS | Status: AC
  Filled 2018-06-30: qty 1000

## 2018-06-30 MED ORDER — LABETALOL HCL 5 MG/ML IV SOLN: 10.0000 mg | INTRAVENOUS | Status: DC | PRN

## 2018-06-30 MED ORDER — FENTANYL CITRATE (PF) 100 MCG/2ML IJ SOLN
INTRAMUSCULAR | Status: DC | PRN
  Administered 2018-06-30: 25 ug via INTRAVENOUS

## 2018-06-30 MED ORDER — MIDAZOLAM HCL 2 MG/2ML IJ SOLN
INTRAMUSCULAR | Status: DC | PRN
  Administered 2018-06-30: 1 mg via INTRAVENOUS

## 2018-06-30 MED ORDER — SODIUM CHLORIDE 0.9% FLUSH
3.0000 mL | Freq: Two times a day (BID) | INTRAVENOUS | Status: DC
  Administered 2018-06-30 – 2018-07-10 (×17): 3 mL via INTRAVENOUS

## 2018-06-30 SURGICAL SUPPLY — 10 items
CATH OMNI FLUSH 5F 65CM (CATHETERS) ×1 IMPLANT
CLOSURE MYNX CONTROL 5F (Vascular Products) ×1 IMPLANT
KIT MICROPUNCTURE NIT STIFF (SHEATH) ×1 IMPLANT
KIT PV (KITS) ×2 IMPLANT
SHEATH PINNACLE 5F 10CM (SHEATH) ×1 IMPLANT
SHEATH PROBE COVER 6X72 (BAG) ×1 IMPLANT
SYR MEDRAD MARK V 150ML (SYRINGE) ×1 IMPLANT
TRANSDUCER W/STOPCOCK (MISCELLANEOUS) ×2 IMPLANT
TRAY PV CATH (CUSTOM PROCEDURE TRAY) ×2 IMPLANT
WIRE BENTSON .035X145CM (WIRE) ×1 IMPLANT

## 2018-06-30 NOTE — Progress Notes (Signed)
Bilateral lower extremity great saphenous mapping has been completed.    06/30/18 2:36 PM [name] RVT

## 2018-06-30 NOTE — Progress Notes (Addendum)
Pt seen and examined full consult note to follow.  He has consented to agram later today Keep NPO Consent IV hydration for elevated creatinine   CBC    Component Value Date/Time   WBC 9.9 06/30/2018 0503   RBC 3.58 (L) 06/30/2018 0503   HGB 8.2 (L) 06/30/2018 0503   HCT 26.6 (L) 06/30/2018 0503   PLT 314 06/30/2018 0503   MCV 74.3 (L) 06/30/2018 0503   MCH 22.9 (L) 06/30/2018 0503   MCHC 30.8 06/30/2018 0503   RDW 17.9 (H) 06/30/2018 0503   LYMPHSABS 0.6 (L) 06/27/2018 1045   MONOABS 0.9 06/27/2018 1045   EOSABS 0.1 06/27/2018 1045   BASOSABS 0.0 06/27/2018 1045    BMET    Component Value Date/Time   NA PENDING 06/30/2018 0503   K PENDING 06/30/2018 0503   CL PENDING 06/30/2018 0503   CO2 PENDING 06/30/2018 0503   GLUCOSE 143 (H) 06/30/2018 0503   BUN 17 06/30/2018 0503   CREATININE 1.31 (H) 06/30/2018 0503   CALCIUM PENDING 06/30/2018 0503   GFRNONAA 56 (L) 06/30/2018 0503   GFRAA >60 06/30/2018 0503     Ruta Hinds, MD [facility] Office: [phone] Pager: [phone]

## 2018-06-30 NOTE — Progress Notes (Signed)
ANTICOAGULATION CONSULT NOTE - Follow Up Consult  Pharmacy Consult for Heparin Indication: DVT treatment  No Known Allergies  Vital Signs: Temp: 98.9 F (37.2 C) (01/06 1454) Temp Source: Oral (01/06 1454) BP: 139/77 (01/06 1454) Pulse Rate: 74 (01/06 1454)  Labs: Recent Labs    06/28/18 0248 06/29/18 1119 06/29/18 2301 06/30/18 0503 06/30/18 0833  HGB 7.9* 8.2*  --  8.2*  --   HCT 24.7* 26.0*  --  26.6*  --   PLT 297 315  --  314  --   APTT  --   --  54*  --  53*  HEPARINUNFRC  --   --  0.65  --  0.38  CREATININE 1.41* 1.29*  --  1.31*  --     Estimated Creatinine Clearance: 69.4 mL/min (A) (by C-G formula based on SCr of 1.31 mg/dL (H)).  Assessment: 68 year old male on Xarelto 2.5 mg po BID dosing prior to admit for PVD and continued through 06/29/18 , last dose given at 0851 AM. Pharmacy consulted to dose IV heparin for acute DVT while Xarelto for PVD is on hold.  Currently using aPTT to guide heparin dosing since Xarelto falsely elevates heparin levels.   S/p angiogram today.   Heparin drip to restart 6 hours after angiogram which will be to restart at 19:15 tonight.  No bleeding reported   heparin level was elevated due to prior Xarelto use  Goal of Therapy:  Monitor platelets by anticoagulation protocol: Yes  Heparin level = 0.3 to 0.7 units/mL PTT = 66 to 102 seconds   Plan:  Restart Heparin tonight at 19:15 at rate of 1600 units/hr Check 6 hour aPTT Daily aPTT,  heparin level and CBC.   Nicole Cella, RPh Clinical Pharmacist Please check AMION for all [facility] phone numbers After 10:00 PM, call [place] [phone] 06/30/2018 4:01 PM

## 2018-06-30 NOTE — Anesthesia Preprocedure Evaluation (Addendum)
Anesthesia Evaluation  Patient identified by MRN, date of birth, ID band Patient awake    Reviewed: Allergy & Precautions, NPO status , Patient's Chart, lab work & pertinent test results  History of Anesthesia Complications Negative for: history of anesthetic complications  Airway Mallampati: III  TM Distance: >3 FB Neck ROM: Full    Dental no notable dental hx. (+) Teeth Intact   Pulmonary sleep apnea , former smoker,    Pulmonary exam normal        Cardiovascular hypertension, + Peripheral Vascular Disease and + DVT  Normal cardiovascular exam     Neuro/Psych negative neurological ROS  negative psych ROS   GI/Hepatic negative GI ROS, Neg liver ROS,   Endo/Other  diabetes, Insulin Dependent  Renal/GU Renal InsufficiencyRenal disease  negative genitourinary   Musculoskeletal negative musculoskeletal ROS (+)   Abdominal   Peds  Hematology  (+) anemia ,   Anesthesia Other Findings 68 yo M for femoral-peroneal bypass graft and L TMA - OSA on CPAP, HTN, DVT, PVD, CKD 3, IDDM - TTE 2014: EF 60-65%, normal valves  Reproductive/Obstetrics                            Anesthesia Physical Anesthesia Plan  ASA: III  Anesthesia Plan: General   Post-op Pain Management:    Induction: Intravenous  PONV Risk Score and Plan: 2 and Ondansetron, Dexamethasone and Treatment may vary due to age or medical condition  Airway Management Planned: Oral ETT  Additional Equipment: None  Intra-op Plan:   Post-operative Plan: Extubation in OR  Informed Consent: I have reviewed the patients History and Physical, chart, labs and discussed the procedure including the risks, benefits and alternatives for the proposed anesthesia with the patient or authorized representative who has indicated his/her understanding and acceptance.   Dental advisory given  Plan Discussed with:   Anesthesia Plan Comments:         Anesthesia Quick Evaluation

## 2018-06-30 NOTE — Op Note (Signed)
Patient name: [name] MRN: [id number] DOB: [birth year] Sex: male  06/30/2018 Pre-operative Diagnosis: Critical limb ischemia of the left lower extremity with tissue loss Post-operative diagnosis:  Same Surgeon:  Marty Heck, MD Procedure Performed: 1.  Ultrasound-guided access of the right common femoral artery 2.  Aortogram 3.  Left lower extremity arteriogram with selection of second order branches 4.  45 minutes of monitored moderate conscious sedation time 5.  Mynx closure of the right common femoral artery  Indications: Patient is a 68 year old male who recently presented with worsening tissue loss of his left foot.  He has a complex history including a previous left SFA and popliteal artery atherectomy with drug-coated balloon for an occlusion by Dr. [name] in 07/2017.  This ultimately occluded and he underwent additional atherectomy with distal SFA and popliteal stenting with a Viabhan in 03/2018.  Ultimately this reoccluded several weeks later and he went additional intervention including lysis as noted in Dr. Oneida Alar consult note and the SFA/popliteal stent remained occluded with peroneal runoff.  He presents today for diagnostic arteriogram and evaluation of other surgical options.  Findings: Aortogram revealed no significant aortoiliac disease.  Left lower extremity arteriogram revealed a patent common femoral, profunda, and proximal to mid SFA.  The distal SFA as well as the popliteal artery are occluded and this includes occlusion of the previous distal SFA and popliteal stent.  Patient appears to have single-vessel runoff via the peroneal which reconstitutes in the proximal calf.  In the proximal one third of the artery there is a focal approximate 50% stenosis from a calcified lesion in the peroneal while the mid to distal portion of the artery appears widely patent.  The posterior tibial did appear to reconstitute in the proximal calf but is heavily diseased and is  occluded in mid to distal calf with no reconstitution.  The anterior tibial appears occluded throughout its course.  On the lateral foot shot it does apper that the peroneal reconstitutes small dorsalis pedis with filling of the plantar arch.   Procedure:  The patient was identified in the holding area and taken to room 8.  The patient was then placed supine on the table and prepped and draped in the usual sterile fashion.  A time out was called.  Ultrasound was used to evaluate the right common femoral artery.  It was patent .  A digital ultrasound image was acquired.  A micropuncture needle was used to access the right common femoral artery under ultrasound guidance.  An 018 wire was advanced without resistance and a micropuncture sheath was placed.  The 018 wire was removed and a benson wire was placed.  The micropuncture sheath was exchanged for a 5 french sheath.  An omniflush catheter was advanced over the wire to the level of L-1.  An abdominal angiogram was obtained.  Next, using the omniflush catheter and a benson wire, the aortic bifurcation was crossed and the catheter was placed into theleft external iliac artery and left runoff was obtained.  In addition we obtained a lateral foot shot with delayed runoff to ensure that we could correctly identify patient's runoff.  After we had adequate pictures for operative planning our wires and catheters were removed.  A 5 French mynx closure device was deployed in the right common femoral artery and additional pressure was held for 10 minutes.  He tolerated the procedure without any immediate complications.  Condition: Stable  Plan: The images were reviewed with Dr. Oneida Alar in the  PV lab and after further discussion with the patient tentative plan for left transmetatarsal amputation tomorrow and if enough tissue is viable likely femoral to peroneal bypass in the mid to distal calf.  Discussed if there is extensive tissue loss during the transmetatarsal  amputation may not be a candidate for bypass.     Marty Heck, MD [facility] Office: [phone] Pager: [phone]

## 2018-06-30 NOTE — Progress Notes (Signed)
Pt arrived to 4E-15 from cath lab via stretcher. Pt transferred to bed. CHG bath given. Tele applied, CCMD notified. Pt oriented to room, call bell, and bed. Call bell within reach. Pt reminded to stay on bedrest until 1315, pt voiced understanding. Will continue to monitor.  Amanda Cockayne, RN

## 2018-06-30 NOTE — Progress Notes (Addendum)
Patient was examined by me today and also interviewed by his wife and also his son at the bedside.  I have discussed with the patient that he probably will have some form of amputation in view of osteomyelitis, agree with need for peripheral angiography and hopefully there is surgical option.  Patient's question regarding prevention of similar situation occurring in the other limbs was a big concern for the patient and his family.  I have explained to them that he had severe PAD involving the left leg only, venous insufficiency study has been negative in the past, he has no hypercoagulable state, all the events are occurring in the diseased vascular bed in the left leg.  Hence I reassured them.  I have also discussed with him regarding limb loss whether it is below the knee or above the knee, threat of sepsis from osteomyelitis and mortality was also discussed.  Patient understands the need for possible amputation as well.  All questions were answered.  They appear to be convinced and pleased that appropriate care has been provided. Appreciate Dr. Ruta Hinds evaluating the patient and he plans on performing angiography sometime later this afternoon.   Adrian Prows, MD 06/30/2018, 8:33 AM [facility] [place] Pager: [phone] Office: [phone] If no answer: Cell:  [phone]

## 2018-06-30 NOTE — Progress Notes (Signed)
ANTICOAGULATION CONSULT NOTE - Follow Up Consult  Pharmacy Consult for Heparin Indication: DVT treatment  No Known Allergies  Vital Signs: Temp: 99 F (37.2 C) (01/05 1947) Temp Source: Oral (01/05 1947) BP: 127/73 (01/05 1947) Pulse Rate: 60 (01/05 1947)  Labs: Recent Labs    06/27/18 1045 06/28/18 0248 06/29/18 1119 06/29/18 2301  HGB 9.2* 7.9* 8.2*  --   HCT 30.0* 24.7* 26.0*  --   PLT 326 297 315  --   APTT  --   --   --  54*  HEPARINUNFRC  --   --   --  0.65  CREATININE 1.64* 1.41* 1.29*  --     Estimated Creatinine Clearance: 70.5 mL/min (A) (by C-G formula based on SCr of 1.29 mg/dL (H)).  Assessment: 68 year old male to begin heparin for VTE treatment Only on Xarelto 2.5 mg po BID dosing  1/6 AM update: initial aPTT is low tonight, heparin level elevated due to Xarelto use  Goal of Therapy:  Monitor platelets by anticoagulation protocol: Yes  Heparin level = 0.3 to 0.7 units/mL PTT = 66 to 102 seconds   Plan:  Inc heparin to 1400 units/hr 0900 aPTT/HL  Narda Bonds, PharmD, BCPS Clinical Pharmacist Phone: [phone]

## 2018-06-30 NOTE — Progress Notes (Signed)
ANTICOAGULATION & ANTIBIOTIC CONSULT NOTE  Pharmacy Consult:  Heparin + Vancomycin Indication: DVT treatment + Toe osteomyelitis/cellulitis  No Known Allergies  Vital Signs: Temp: 98.6 F (37 C) (01/06 0415) Temp Source: Oral (01/06 0415) BP: 130/70 (01/06 0415) Pulse Rate: 72 (01/06 0415)  Heparin dosing weight = 101 kg  Labs: Recent Labs    06/28/18 0248 06/29/18 1119 06/29/18 2301 06/30/18 0503 06/30/18 0833  HGB 7.9* 8.2*  --  8.2*  --   HCT 24.7* 26.0*  --  26.6*  --   PLT 297 315  --  314  --   APTT  --   --  54*  --  53*  HEPARINUNFRC  --   --  0.65  --  0.38  CREATININE 1.41* 1.29*  --  1.31*  --     Estimated Creatinine Clearance: 69.4 mL/min (A) (by C-G formula based on SCr of 1.31 mg/dL (H)).   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  Pharmacy has been consulted for vancomycin dosing.  Patient is also on Rocephin and Flagyl per MD.  Renal function is improving overall.  Afebrile and WBC WNL.  Angiogram today.  Vanc 1/3 >> CTX 1/3 >> Flagyl 1/3 >>  LVQ PTA  1/3 MRSA PCR - positive   Pharmacy also consulted to dose IV heparin for acute DVT while Xarelto for PVD is on hold.  Currently using aPTT to guide heparin dosing since Xarelto falsely elevates heparin levels.  APTT is sub-therapeutic; no bleeding reported.   Goal of Therapy:  Heparin level = 0.3 to 0.7 units/mL aPTT = 66 to 102 seconds Monitor platelets by anticoagulation protocol: Yes  Vanc AUC 400-550    Plan:  Increase heparin gtt to 1600 units/hr Check 6 hr aPTT Daily heparin level, aPTT and CBC Consider holding Plavix if planning on amputation  Continue vanc 1500mg  IV Q24H for AUC 471 using SCr 1.31 CTX 2gm IV Q24H per MD Flagyl 500mg  IV Q8H per MD Monitor renal fxn, clinical progress, AUC as indicated, surgical plans   Amye Grego D. Mina Marble, PharmD, BCPS, [place] 06/30/2018, 9:51 AM

## 2018-06-30 NOTE — Progress Notes (Signed)
[facility]                                                                              Patient Demographics  [name], is a 68 y.o. male, DOB - 1951-03-03, QJF:[id number]  Admit date - 06/27/2018   Admitting Physician Edwin Dada, MD  Outpatient Primary MD for the patient is [name], MD  Outpatient specialists:   LOS - 3  days   Medical records reviewed and are as summarized below:    Chief Complaint  Patient presents with  . Foot Pain  . 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  The patient was treated for restenosis of his left SFA in Oct complicated by in stent thrombosis and requiring 2 repeat angiograms for stenting. 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 Two days ago he was seen in wound care, had a new ulcer on the left THIRD digit, was started on Levaquin which he has taken. On the day of admission, this third digit was completely purple, so he was sent from wound care center for IV antibiotics.     Assessment & Plan   Gangrene, left third toe, new and left fifth toe, chronic with severe PVD -Continue IV antibiotics -Cardiology following -ABIs that showed occluded stents -MRI of the left foot showed osteonecrosis of distal phalanx of the little toe, marrow edema in the proximal phalanx of fifth toe consistent with osteomyelitis, bony destructive changes of the tuft of the third toe consistent with osteomyelitis, soft tissue gas about the distal phalanx of the third toe consistent with cellulitis. -Vascular surgery consulted, plan for angiogram today   Diabetes mellitus uncontrolled with diabetic foot ulcer, IDDM -Hemoglobin A1c 6.5 -CBGs controlled,  continue Lantus, sliding scale insulin with meal coverage.     Essential hypertension -BP somewhat elevated, continue Coreg, Cozaar, triamterene/HCTZ -Very anxious, add hydralazine IV as needed with parameters  Gout - continue allopurinol, colchicine  Chronic anemia, likely due to PVD -baseline appears to be around 8 -Anemia panel suggestive of anemia of chronic disease -Hemoglobin stable at 8.2, at baseline  CKD stage III -Baseline creatinine 1.5-1.7, currently at baseline  Code Status: Full CODE STATUS DVT Prophylaxis: On Xarelto Family Communication: Discussed in detail with the patient, all imaging results, lab results explained to the patient     Disposition Plan:   Time Spent in minutes   35 minutes  Procedures:  Procedure Performed: 06/30/2018 1.  Ultrasound-guided access of the right common femoral artery 2.  Aortogram 3.  Left lower extremity arteriogram with selection of second order branches 4.  45 minutes of monitored moderate conscious sedation time 5.  Mynx closure of the right common femoral artery  Consultants:   Cardiology Vascular surgery  Antimicrobials:      Medications  Scheduled Meds: . [MAR Hold] allopurinol  100  mg Oral Daily  . [MAR Hold] carvedilol  12.5 mg Oral Q1400  . [MAR Hold] Chlorhexidine Gluconate Cloth  6 each Topical Q0600  . [MAR Hold] clopidogrel  75 mg Oral Daily  . [MAR Hold] colchicine  0.6 mg Oral BID  . [MAR Hold] dorzolamide-timolol  1 drop Both Eyes BID  . [MAR Hold] feeding supplement (GLUCERNA SHAKE)  237 mL Oral BID BM  . [MAR Hold] feeding supplement (PRO-STAT SUGAR FREE 64)  30 mL Oral Daily  . [MAR Hold] insulin aspart  0-15 Units Subcutaneous TID WC  . [MAR Hold] insulin aspart  0-5 Units Subcutaneous QHS  . [MAR Hold] insulin aspart  3 Units Subcutaneous TID WC  . [MAR Hold] insulin glargine  15 Units Subcutaneous QHS  . [MAR Hold] latanoprost  1 drop Both Eyes QHS  . [MAR Hold] losartan  100 mg Oral  Q1400  . [MAR Hold] mupirocin ointment  1 application Nasal BID  . [MAR Hold] pravastatin  40 mg Oral QHS  . [MAR Hold] pregabalin  50-100 mg Oral QHS  . [MAR Hold] triamterene-hydrochlorothiazide  1 tablet Oral Q1400   Continuous Infusions: . sodium chloride 125 mL/hr at 06/30/18 0949  . sodium chloride 1 mL/kg/hr (06/30/18 1341)  . [MAR Hold] cefTRIAXone (ROCEPHIN)  IV Stopped (06/29/18 1517)  . heparin Stopped (06/30/18 1130)  . [MAR Hold] metronidazole 500 mg (06/30/18 0507)  . [MAR Hold] vancomycin 1,500 mg (06/30/18 0952)   PRN Meds:.[MAR Hold] acetaminophen **OR** [MAR Hold] acetaminophen, [MAR Hold] ondansetron **OR** [MAR Hold] ondansetron (ZOFRAN) IV, [MAR Hold] oxyCODONE-acetaminophen   Antibiotics   Anti-infectives (From admission, onward)   Start     Dose/Rate Route Frequency Ordered Stop   06/29/18 0900  [MAR Hold]  vancomycin (VANCOCIN) 1,500 mg in sodium chloride 0.9 % 500 mL IVPB     (MAR Hold since Mon 06/30/2018 at 1142. Reason: Transfer to a Procedural area.)   1,500 mg 250 mL/hr over 120 Minutes Intravenous Every 24 hours 06/28/18 1443     06/28/18 1500  [MAR Hold]  metroNIDAZOLE (FLAGYL) IVPB 500 mg     (MAR Hold since Mon 06/30/2018 at 1142. Reason: Transfer to a Procedural area.)   500 mg 100 mL/hr over 60 Minutes Intravenous Every 8 hours 06/28/18 1443     06/28/18 1500  [MAR Hold]  cefTRIAXone (ROCEPHIN) 2 g in sodium chloride 0.9 % 100 mL IVPB     (MAR Hold since Mon 06/30/2018 at 1142. Reason: Transfer to a Procedural area.)   2 g 200 mL/hr over 30 Minutes Intravenous Every 24 hours 06/28/18 1443     06/28/18 1000  vancomycin (VANCOCIN) 1,500 mg in sodium chloride 0.9 % 500 mL IVPB  Status:  Discontinued     1,500 mg 250 mL/hr over 120 Minutes Intravenous Every 24 hours 06/27/18 1536 06/28/18 1435   06/27/18 1500  cefTRIAXone (ROCEPHIN) 2 g in sodium chloride 0.9 % 100 mL IVPB  Status:  Discontinued     2 g 200 mL/hr over 30 Minutes Intravenous Every 24 hours  06/27/18 1414 06/28/18 1435   06/27/18 1500  metroNIDAZOLE (FLAGYL) IVPB 500 mg  Status:  Discontinued     500 mg 100 mL/hr over 60 Minutes Intravenous Every 8 hours 06/27/18 1414 06/28/18 1435   06/27/18 1015  vancomycin (VANCOCIN) 2,000 mg in sodium chloride 0.9 % 500 mL IVPB     2,000 mg 250 mL/hr over 120 Minutes Intravenous  Once 06/27/18 1013 06/27/18 1247  Subjective:   [name] was seen and examined today.  Very anxious about the angiogram and fearful of losing his foot or leg.  Patient denies dizziness, chest pain, shortness of breath, abdominal pain, N/V/D/C.  No acute events overnight  Objective:   Vitals:   06/30/18 1324 06/30/18 1325 06/30/18 1330 06/30/18 1345  BP: (!) 144/96 (!) 144/96 (!) 153/90 (!) 163/101  Pulse:  69 69 (!) 153  Resp:  14 14 20   Temp:      TempSrc:      SpO2:  100% 100% 99%  Weight:      Height:        Intake/Output Summary (Last 24 hours) at 06/30/2018 1348 Last data filed at 06/30/2018 0416 Gross per 24 hour  Intake 790.31 ml  Output 1200 ml  Net -409.69 ml     Wt Readings from Last 3 Encounters:  06/27/18 104.3 kg  05/05/18 114.8 kg  04/22/18 113.4 kg     Physical Exam General: Alert and oriented x 3, NAD Eyes:  HEENT:   Cardiovascular: S1 S2 clear, no murmurs, RRR. No pedal edema b/l Respiratory: CTAB, no wheezing, rales or rhonchi Gastrointestinal: Soft, nontender, nondistended, NBS Ext: no pedal edema bilaterally Neuro: no new deficits Musculoskeletal: No cyanosis, clubbing Skin: Right foot dressing intact Psych: Anxious, oriented x3   Data Reviewed:  I have personally reviewed following labs and imaging studies  Micro Results Recent Results (from the past 240 hour(s))  MRSA PCR Screening     Status: Abnormal   Collection Time: 06/27/18  4:06 PM  Result Value Ref Range Status   MRSA by PCR POSITIVE (A) NEGATIVE Final    Comment:        The GeneXpert MRSA Assay (FDA approved for NASAL specimens only),  is one component of a comprehensive MRSA colonization surveillance program. It is not intended to diagnose MRSA infection nor to guide or monitor treatment for MRSA infections. RESULT CALLED TO, READ BACK BY AND VERIFIED WITHConception Chancy RN [phone] BY GF Performed at [facility], [place] [address], [place], [place] [address]     Radiology Reports Mr Foot Left Wo Contrast  Result Date: 06/29/2018 CLINICAL DATA:  Diabetic patient with a skin ulceration on the third toe of the left foot. Left foot pain, redness and swelling. The patient's little toe appears necrotic. EXAM: MRI OF THE LEFT FOOT WITHOUT CONTRAST TECHNIQUE: Multiplanar, multisequence MR imaging of the left foot was performed. No intravenous contrast was administered. COMPARISON:  Plain films left foot 06/23/2018. FINDINGS: Bones/Joint/Cartilage Markedly decreased T1 and T2 signal throughout the distal phalanx of the little toe are most consistent with osteonecrosis. There is increased T2 signal throughout the proximal phalanx of the great toe consistent with osteomyelitis. The distal phalanx of the third toe is not well seen but there appears to be bony destructive change of the tuft. Innumerable punctate foci of decreased T1 and T2 signal in soft tissues about the distal phalanx are consistent with the presence of gas. The third toe is otherwise normal in appearance. Mild hallux valgus and mild first MTP osteoarthritis are seen. Imaged bones otherwise appear normal. Ligaments Intact. Muscles and Tendons Marked atrophy of intrinsic musculature the foot is identified. No intramuscular fluid collection. Soft tissues Diffuse subcutaneous edema is present about the dorsum of the foot. IMPRESSION: Markedly decreased T1 and T2 signal throughout the distal phalanx of the little toe likely due to osteonecrosis. Marrow edema in the proximal phalanx of the  fifth toe is consistent with osteomyelitis. Bony destructive change in the tuft of the  third toe consistent with osteomyelitis. No other evidence of osteomyelitis is identified. Soft tissue gas about the distal phalanx of the third toe consistent with skin ulceration and cellulitis. Negative for abscess or septic joint. Diffuse subcutaneous edema about the foot most consistent with cellulitis. Electronically Signed   By: Inge Rise M.D.   On: 06/29/2018 13:09   Dg Foot Complete Left  Result Date: 06/23/2018 CLINICAL DATA:  Left foot ulcers. EXAM: LEFT FOOT - COMPLETE 3+ VIEW COMPARISON:  None. FINDINGS: Old distal left fifth metatarsal fracture is noted. Large soft tissue ulceration is seen involving distal portion of third toe with underlying lytic destruction of the distal tuft of third distal phalanx consistent with osteomyelitis. Joint spaces are intact. IMPRESSION: Soft tissue ulceration involving distal soft tissues of third toe with underlying lytic destruction of distal tuft of third distal phalanx consistent with osteomyelitis. Electronically Signed   By: Marijo Conception, M.D.   On: 06/23/2018 15:54   Vas [place] Lower Extremity Arterial Duplex  Result Date: 06/29/2018 LOWER EXTREMITY ARTERIAL DUPLEX STUDY Indications: Unsuccessful revascularization 05/09/18 previous SFA distal and              popliteal stenting with stent thrombosis.  Current ABI: Previous ABI 05/02/18 Right 0.82 Left 0.43 Performing Technologist: June Leap RDMS, RVT  Examination Guidelines: A complete evaluation includes B-mode imaging, spectral Doppler, color Doppler, and power Doppler as needed of all accessible portions of each vessel. Bilateral testing is considered an integral part of a complete examination. Limited examinations for reoccurring indications may be performed as noted.  Left Duplex Findings: +-----------+--------+-----+--------+----------+-------------------------------+            PSV cm/sRatioStenosisWaveform  Comments                         +-----------+--------+-----+--------+----------+-------------------------------+ CFA Prox   89                   biphasic                                  +-----------+--------+-----+--------+----------+-------------------------------+ DFA        62                   biphasic                                  +-----------+--------+-----+--------+----------+-------------------------------+ SFA Prox   101                  biphasic                                  +-----------+--------+-----+--------+----------+-------------------------------+ SFA Mid    74                   biphasic                                  +-----------+--------+-----+--------+----------+-------------------------------+ SFA Distal 52                   biphasic  proximal part of stent is  patent, collateral vessel is                                              noted just above where stent                                              occludes                        +-----------+--------+-----+--------+----------+-------------------------------+ POP Prox                                  occluded stent                  +-----------+--------+-----+--------+----------+-------------------------------+ POP Distal                                occluded stent                  +-----------+--------+-----+--------+----------+-------------------------------+ ATA Prox   44                   monophasic                                +-----------+--------+-----+--------+----------+-------------------------------+ ATA Mid                                   unable to image due to bandages +-----------+--------+-----+--------+----------+-------------------------------+ PTA Mid                         monophasic                                +-----------+--------+-----+--------+----------+-------------------------------+ PTA  Distal 41                   monophasic                                +-----------+--------+-----+--------+----------+-------------------------------+ PERO Mid                        occluded                                  +-----------+--------+-----+--------+----------+-------------------------------+ PERO Distal                     occluded                                  +-----------+--------+-----+--------+----------+-------------------------------+  Summary: Left: Left popliteal stent is occluded. Left peroneal artery is occluded. Details mentioned above. Incidental finding: left popliteal vein DVT.   See table(s) above for measurements and observations. Electronically signed by Ruta Hinds MD on  06/29/2018 at 12:35:35 PM.    Final     Lab Data:  CBC: Recent Labs  Lab 06/27/18 1045 06/28/18 0248 06/29/18 1119 06/30/18 0503  WBC 11.2* 9.7 9.9 9.9  NEUTROABS 9.6*  --   --   --   HGB 9.2* 7.9* 8.2* 8.2*  HCT 30.0* 24.7* 26.0* 26.6*  MCV 76.9* 75.8* 74.7* 74.3*  PLT 326 297 315 956   Basic Metabolic Panel: Recent Labs  Lab 06/27/18 1045 06/28/18 0248 06/29/18 1119 06/30/18 0503  NA 134* 134* 132* 132*  K 3.7 4.0 3.9 3.8  CL 96* 99 99 100  CO2 25 24 23 23   GLUCOSE 133* 87 178* 143*  BUN 25* 18 17 17   CREATININE 1.64* 1.41* 1.29* 1.31*  CALCIUM 9.4 9.0 9.0 8.9   GFR: Estimated Creatinine Clearance: 69.4 mL/min (A) (by C-G formula based on SCr of 1.31 mg/dL (H)). Liver Function Tests: No results for input(s): AST, ALT, ALKPHOS, BILITOT, PROT, ALBUMIN in the last 168 hours. No results for input(s): LIPASE, AMYLASE in the last 168 hours. No results for input(s): AMMONIA in the last 168 hours. Coagulation Profile: No results for input(s): INR, PROTIME in the last 168 hours. Cardiac Enzymes: No results for input(s): CKTOTAL, CKMB, CKMBINDEX, TROPONINI in the last 168 hours. BNP (last 3 results) No results for input(s): PROBNP in the last 8760  hours. HbA1C: Recent Labs    06/28/18 0248  HGBA1C 6.5*   CBG: Recent Labs  Lab 06/29/18 1144 06/29/18 2157 06/30/18 0657 06/30/18 1132 06/30/18 1328  GLUCAP 159* 151* 128* 132* 99   Lipid Profile: No results for input(s): CHOL, HDL, LDLCALC, TRIG, CHOLHDL, LDLDIRECT in the last 72 hours. Thyroid Function Tests: No results for input(s): TSH, T4TOTAL, FREET4, T3FREE, THYROIDAB in the last 72 hours. Anemia Panel: Recent Labs    06/28/18 0248  FERRITIN 179  TIBC 227*  IRON 15*   Urine analysis:    Component Value Date/Time   COLORURINE YELLOW 04/29/2018 0820   APPEARANCEUR CLEAR 04/29/2018 0820   LABSPEC 1.013 04/29/2018 0820   PHURINE 5.0 04/29/2018 0820   GLUCOSEU 50 (A) 04/29/2018 0820   HGBUR NEGATIVE 04/29/2018 0820   BILIRUBINUR NEGATIVE 04/29/2018 0820   KETONESUR NEGATIVE 04/29/2018 0820   PROTEINUR NEGATIVE 04/29/2018 0820   UROBILINOGEN 0.2 12/01/2012 2135   NITRITE NEGATIVE 04/29/2018 0820   LEUKOCYTESUR NEGATIVE 04/29/2018 0820     Ripudeep Rai M.D. [facility] 06/30/2018, 1:48 PM  Pager: [phone] Between 7am to 7pm - call Pager - [phone]  After 7pm go to [email or web address] - password [email or web address]  Call night coverage person covering after 7pm

## 2018-06-30 NOTE — Progress Notes (Signed)
Pt went down for procedure. Cell phone and eye drops put in pt belongings bag and put in closet. Pt aware and agreeable.

## 2018-07-01 ENCOUNTER — Encounter (HOSPITAL_COMMUNITY)
Admission: EM | Disposition: A | Discharge status: Skilled Nursing Facility | Payer: Self-pay | Source: Home / Self Care | Attending: Internal Medicine

## 2018-07-01 ENCOUNTER — Inpatient Hospital Stay (HOSPITAL_COMMUNITY): Payer: BC Managed Care – PPO | Admitting: Anesthesiology

## 2018-07-01 ENCOUNTER — Inpatient Hospital Stay
Admission: RE | Admit: 2018-07-01 | Payer: BC Managed Care – PPO | Source: Home / Self Care | Admitting: Vascular Surgery

## 2018-07-01 ENCOUNTER — Encounter (HOSPITAL_COMMUNITY): Payer: Self-pay

## 2018-07-01 ENCOUNTER — Inpatient Hospital Stay (HOSPITAL_COMMUNITY): Payer: BC Managed Care – PPO

## 2018-07-01 DIAGNOSIS — E08621 Diabetes mellitus due to underlying condition with foot ulcer: Secondary | ICD-10-CM

## 2018-07-01 DIAGNOSIS — L97401 Non-pressure chronic ulcer of unspecified heel and midfoot limited to breakdown of skin: Secondary | ICD-10-CM

## 2018-07-01 HISTORY — PX: INTRAOPERATIVE ARTERIOGRAM: SHX5157

## 2018-07-01 HISTORY — PX: BYPASS GRAFT FEMORAL-PERONEAL: SHX5762

## 2018-07-01 HISTORY — PX: TRANSMETATARSAL AMPUTATION: SHX6197

## 2018-07-01 HISTORY — PX: VEIN HARVEST: SHX6363

## 2018-07-01 LAB — CBC
HCT: 27.3 % — ABNORMAL LOW (ref 39.0–52.0)
Hemoglobin: 8.6 g/dL — ABNORMAL LOW (ref 13.0–17.0)
MCH: 23.6 pg — ABNORMAL LOW (ref 26.0–34.0)
MCHC: 31.5 g/dL (ref 30.0–36.0)
MCV: 74.8 fL — ABNORMAL LOW (ref 80.0–100.0)
Platelets: 345 10*3/uL (ref 150–400)
RBC: 3.65 MIL/uL — ABNORMAL LOW (ref 4.22–5.81)
RDW: 18.3 % — ABNORMAL HIGH (ref 11.5–15.5)
WBC: 10.6 10*3/uL — ABNORMAL HIGH (ref 4.0–10.5)
nRBC: 0 % (ref 0.0–0.2)

## 2018-07-01 LAB — POCT I-STAT 7, (LYTES, BLD GAS, ICA,H+H)
Acid-base deficit: 1 mmol/L (ref 0.0–2.0)
Bicarbonate: 23.5 mmol/L (ref 20.0–28.0)
Calcium, Ion: 1.22 mmol/L (ref 1.15–1.40)
HCT: 26 % — ABNORMAL LOW (ref 39.0–52.0)
Hemoglobin: 8.8 g/dL — ABNORMAL LOW (ref 13.0–17.0)
O2 Saturation: 93 %
Potassium: 4.5 mmol/L (ref 3.5–5.1)
Sodium: 135 mmol/L (ref 135–145)
TCO2: 25 mmol/L (ref 22–32)
pCO2 arterial: 38.6 mmHg (ref 32.0–48.0)
pH, Arterial: 7.391 (ref 7.350–7.450)
pO2, Arterial: 69 mmHg — ABNORMAL LOW (ref 83.0–108.0)

## 2018-07-01 LAB — BASIC METABOLIC PANEL
Anion gap: 10 (ref 5–15)
BUN: 15 mg/dL (ref 8–23)
CO2: 24 mmol/L (ref 22–32)
Calcium: 8.9 mg/dL (ref 8.9–10.3)
Chloride: 102 mmol/L (ref 98–111)
Creatinine, Ser: 1.23 mg/dL (ref 0.61–1.24)
GFR calc Af Amer: >60 mL/min (ref >60)
GFR calc non Af Amer: >60 mL/min (ref >60)
Glucose, Bld: 180 mg/dL — ABNORMAL HIGH (ref 70–99)
Potassium: 3.9 mmol/L (ref 3.5–5.1)
Sodium: 136 mmol/L (ref 135–145)

## 2018-07-01 LAB — POCT I-STAT EG7
Acid-base deficit: 3 mmol/L — ABNORMAL HIGH (ref 0.0–2.0)
Acid-base deficit: 4 mmol/L — ABNORMAL HIGH (ref 0.0–2.0)
Bicarbonate: 22.3 mmol/L (ref 20.0–28.0)
Bicarbonate: 21.2 mmol/L (ref 20.0–28.0)
Calcium, Ion: 1.2 mmol/L (ref 1.15–1.40)
Calcium, Ion: 1.15 mmol/L (ref 1.15–1.40)
HCT: 20 % — ABNORMAL LOW (ref 39.0–52.0)
HCT: 24 % — ABNORMAL LOW (ref 39.0–52.0)
Hemoglobin: 6.8 g/dL — CL (ref 13.0–17.0)
Hemoglobin: 8.2 g/dL — ABNORMAL LOW (ref 13.0–17.0)
O2 Saturation: 92 %
O2 Saturation: 82 %
Potassium: 4.5 mmol/L (ref 3.5–5.1)
Potassium: 4.9 mmol/L (ref 3.5–5.1)
Sodium: 135 mmol/L (ref 135–145)
Sodium: 136 mmol/L (ref 135–145)
TCO2: 23 mmol/L (ref 22–32)
TCO2: 22 mmol/L (ref 22–32)
pCO2, Ven: 40.7 mmHg — ABNORMAL LOW (ref 44.0–60.0)
pCO2, Ven: 39.6 mmHg — ABNORMAL LOW (ref 44.0–60.0)
pH, Ven: 7.346 (ref 7.250–7.430)
pH, Ven: 7.337 (ref 7.250–7.430)
pO2, Ven: 68 mmHg — ABNORMAL HIGH (ref 32.0–45.0)
pO2, Ven: 49 mmHg — ABNORMAL HIGH (ref 32.0–45.0)

## 2018-07-01 LAB — APTT: aPTT: 63 seconds — ABNORMAL HIGH (ref 24–36)

## 2018-07-01 LAB — GLUCOSE, CAPILLARY
Glucose-Capillary: 138 mg/dL — ABNORMAL HIGH (ref 70–99)
Glucose-Capillary: 133 mg/dL — ABNORMAL HIGH (ref 70–99)
Glucose-Capillary: 216 mg/dL — ABNORMAL HIGH (ref 70–99)
Glucose-Capillary: 234 mg/dL — ABNORMAL HIGH (ref 70–99)
Glucose-Capillary: 323 mg/dL — ABNORMAL HIGH (ref 70–99)

## 2018-07-01 LAB — ABO/RH: ABO/RH(D): O POS

## 2018-07-01 LAB — PREPARE RBC (CROSSMATCH)

## 2018-07-01 SURGERY — CREATION, BYPASS, ARTERIAL, FEMORAL TO PERONEAL, USING GRAFT
Anesthesia: General | Site: Leg Lower | Laterality: Left

## 2018-07-01 MED ORDER — ONDANSETRON HCL 4 MG/2ML IJ SOLN
INTRAMUSCULAR | Status: AC
  Filled 2018-07-01: qty 6

## 2018-07-01 MED ORDER — ROCURONIUM BROMIDE 50 MG/5ML IV SOSY
PREFILLED_SYRINGE | INTRAVENOUS | Status: AC
  Filled 2018-07-01: qty 5

## 2018-07-01 MED ORDER — SODIUM CHLORIDE 0.9 % IV SOLN
INTRAVENOUS | Status: DC | PRN
  Administered 2018-07-01: 11:00:00

## 2018-07-01 MED ORDER — FENTANYL CITRATE (PF) 100 MCG/2ML IJ SOLN
INTRAMUSCULAR | Status: AC
  Administered 2018-07-01: 50 ug via INTRAVENOUS
  Filled 2018-07-01: qty 2

## 2018-07-01 MED ORDER — PHENYLEPHRINE 40 MCG/ML (10ML) SYRINGE FOR IV PUSH (FOR BLOOD PRESSURE SUPPORT)
PREFILLED_SYRINGE | INTRAVENOUS | Status: AC
  Filled 2018-07-01: qty 20

## 2018-07-01 MED ORDER — SODIUM CHLORIDE 0.9 % IV SOLN
INTRAVENOUS | Status: DC | PRN
  Administered 2018-07-01 (×2): via INTRAVENOUS

## 2018-07-01 MED ORDER — IODIXANOL 320 MG/ML IV SOLN
INTRAVENOUS | Status: DC | PRN
  Administered 2018-07-01: 50 mL via INTRA_ARTERIAL

## 2018-07-01 MED ORDER — ROCURONIUM BROMIDE 10 MG/ML (PF) SYRINGE
PREFILLED_SYRINGE | INTRAVENOUS | Status: DC | PRN
  Administered 2018-07-01: 50 mg via INTRAVENOUS
  Administered 2018-07-01: 20 mg via INTRAVENOUS
  Administered 2018-07-01 (×2): 25 mg via INTRAVENOUS
  Administered 2018-07-01 (×2): 50 mg via INTRAVENOUS
  Administered 2018-07-01: 30 mg via INTRAVENOUS
  Administered 2018-07-01: 50 mg via INTRAVENOUS

## 2018-07-01 MED ORDER — SODIUM CHLORIDE 0.9 % IV SOLN
INTRAVENOUS | Status: DC
  Administered 2018-07-01: 20:00:00 via INTRAVENOUS

## 2018-07-01 MED ORDER — PROPOFOL 10 MG/ML IV BOLUS
INTRAVENOUS | Status: AC
  Filled 2018-07-01: qty 20

## 2018-07-01 MED ORDER — SODIUM CHLORIDE 0.9 % IV SOLN
INTRAVENOUS | Status: AC
  Filled 2018-07-01: qty 1.2

## 2018-07-01 MED ORDER — VANCOMYCIN HCL 10 G IV SOLR
1500.0000 mg | INTRAVENOUS | Status: AC
  Administered 2018-07-01: 1500 mg via INTRAVENOUS
  Filled 2018-07-01: qty 1500

## 2018-07-01 MED ORDER — LIDOCAINE 2% (20 MG/ML) 5 ML SYRINGE
INTRAMUSCULAR | Status: DC | PRN
  Administered 2018-07-01: 100 mg via INTRAVENOUS

## 2018-07-01 MED ORDER — ONDANSETRON HCL 4 MG/2ML IJ SOLN
INTRAMUSCULAR | Status: AC
  Filled 2018-07-01: qty 2

## 2018-07-01 MED ORDER — SUGAMMADEX SODIUM 200 MG/2ML IV SOLN
INTRAVENOUS | Status: DC | PRN
  Administered 2018-07-01: 200 mg via INTRAVENOUS

## 2018-07-01 MED ORDER — PROTAMINE SULFATE 10 MG/ML IV SOLN
INTRAVENOUS | Status: DC | PRN
  Administered 2018-07-01: 20 mg via INTRAVENOUS
  Administered 2018-07-01: 30 mg via INTRAVENOUS

## 2018-07-01 MED ORDER — ROCURONIUM BROMIDE 50 MG/5ML IV SOSY
PREFILLED_SYRINGE | INTRAVENOUS | Status: AC
  Filled 2018-07-01: qty 10

## 2018-07-01 MED ORDER — FENTANYL CITRATE (PF) 250 MCG/5ML IJ SOLN
INTRAMUSCULAR | Status: AC
  Filled 2018-07-01: qty 5

## 2018-07-01 MED ORDER — HEPARIN SODIUM (PORCINE) 1000 UNIT/ML IJ SOLN
INTRAMUSCULAR | Status: AC
  Filled 2018-07-01: qty 1

## 2018-07-01 MED ORDER — HEMOSTATIC AGENTS (NO CHARGE) OPTIME
TOPICAL | Status: DC | PRN
  Administered 2018-07-01: 1 via TOPICAL

## 2018-07-01 MED ORDER — LIDOCAINE 2% (20 MG/ML) 5 ML SYRINGE
INTRAMUSCULAR | Status: AC
  Filled 2018-07-01: qty 5

## 2018-07-01 MED ORDER — FENTANYL CITRATE (PF) 250 MCG/5ML IJ SOLN
INTRAMUSCULAR | Status: DC | PRN
  Administered 2018-07-01 (×7): 50 ug via INTRAVENOUS
  Administered 2018-07-01: 100 ug via INTRAVENOUS

## 2018-07-01 MED ORDER — ALUM & MAG HYDROXIDE-SIMETH 200-200-20 MG/5ML PO SUSP: 15.0000 mL | ORAL | Status: DC | PRN

## 2018-07-01 MED ORDER — ALBUMIN HUMAN 5 % IV SOLN
INTRAVENOUS | Status: DC | PRN
  Administered 2018-07-01 (×2): via INTRAVENOUS

## 2018-07-01 MED ORDER — HYDRALAZINE HCL 20 MG/ML IJ SOLN: 5.0000 mg | INTRAMUSCULAR | Status: DC | PRN

## 2018-07-01 MED ORDER — OXYCODONE HCL 5 MG/5ML PO SOLN: 5.0000 mg | Freq: Once | ORAL | Status: DC | PRN

## 2018-07-01 MED ORDER — MIDAZOLAM HCL 2 MG/2ML IJ SOLN
INTRAMUSCULAR | Status: AC
  Filled 2018-07-01: qty 2

## 2018-07-01 MED ORDER — GUAIFENESIN-DM 100-10 MG/5ML PO SYRP: 15.0000 mL | ORAL_SOLUTION | ORAL | Status: DC | PRN

## 2018-07-01 MED ORDER — LACTATED RINGERS IV SOLN
INTRAVENOUS | Status: DC
  Administered 2018-07-01: 09:00:00 via INTRAVENOUS

## 2018-07-01 MED ORDER — DEXAMETHASONE SODIUM PHOSPHATE 10 MG/ML IJ SOLN
INTRAMUSCULAR | Status: DC | PRN
  Administered 2018-07-01: 10 mg via INTRAVENOUS

## 2018-07-01 MED ORDER — FENTANYL CITRATE (PF) 100 MCG/2ML IJ SOLN
25.0000 ug | INTRAMUSCULAR | Status: DC | PRN
  Administered 2018-07-01 (×2): 50 ug via INTRAVENOUS

## 2018-07-01 MED ORDER — VANCOMYCIN HCL 10 G IV SOLR
1500.0000 mg | INTRAVENOUS | Status: AC
  Administered 2018-07-02: 1500 mg via INTRAVENOUS
  Filled 2018-07-01: qty 1500

## 2018-07-01 MED ORDER — MIDAZOLAM HCL 2 MG/2ML IJ SOLN
INTRAMUSCULAR | Status: DC | PRN
Start: 2018-07-01 — End: 2018-07-01
  Administered 2018-07-01: 2 mg via INTRAVENOUS

## 2018-07-01 MED ORDER — ONDANSETRON HCL 4 MG/2ML IJ SOLN: 4.0000 mg | Freq: Once | INTRAMUSCULAR | Status: DC | PRN

## 2018-07-01 MED ORDER — DEXAMETHASONE SODIUM PHOSPHATE 10 MG/ML IJ SOLN
INTRAMUSCULAR | Status: AC
  Filled 2018-07-01: qty 1

## 2018-07-01 MED ORDER — COLCHICINE 0.6 MG PO TABS
0.6000 mg | ORAL_TABLET | Freq: Two times a day (BID) | ORAL | Status: DC
  Administered 2018-07-02 – 2018-07-10 (×17): 0.6 mg via ORAL
  Filled 2018-07-01 (×17): qty 1

## 2018-07-01 MED ORDER — PHENOL 1.4 % MT LIQD: 1.0000 | OROMUCOSAL | Status: DC | PRN

## 2018-07-01 MED ORDER — ONDANSETRON HCL 4 MG/2ML IJ SOLN
INTRAMUSCULAR | Status: DC | PRN
  Administered 2018-07-01: 4 mg via INTRAVENOUS

## 2018-07-01 MED ORDER — LOSARTAN POTASSIUM 50 MG PO TABS
100.0000 mg | ORAL_TABLET | Freq: Every day | ORAL | Status: DC
  Administered 2018-07-03 – 2018-07-10 (×8): 100 mg via ORAL
  Filled 2018-07-01 (×8): qty 2

## 2018-07-01 MED ORDER — ALLOPURINOL 100 MG PO TABS
100.0000 mg | ORAL_TABLET | Freq: Every day | ORAL | Status: DC
  Administered 2018-07-02 – 2018-07-10 (×9): 100 mg via ORAL
  Filled 2018-07-01 (×9): qty 1

## 2018-07-01 MED ORDER — LIDOCAINE HCL (PF) 1 % IJ SOLN
INTRAMUSCULAR | Status: AC
  Filled 2018-07-01: qty 30

## 2018-07-01 MED ORDER — HEPARIN SODIUM (PORCINE) 1000 UNIT/ML IJ SOLN
INTRAMUSCULAR | Status: DC | PRN
  Administered 2018-07-01: 5000 [IU] via INTRAVENOUS
  Administered 2018-07-01: 10000 [IU] via INTRAVENOUS

## 2018-07-01 MED ORDER — LABETALOL HCL 5 MG/ML IV SOLN: 10.0000 mg | INTRAVENOUS | Status: DC | PRN

## 2018-07-01 MED ORDER — PROPOFOL 10 MG/ML IV BOLUS
INTRAVENOUS | Status: DC | PRN
  Administered 2018-07-01: 150 mg via INTRAVENOUS

## 2018-07-01 MED ORDER — DOCUSATE SODIUM 100 MG PO CAPS
100.0000 mg | ORAL_CAPSULE | Freq: Every day | ORAL | Status: DC
  Administered 2018-07-02 – 2018-07-10 (×8): 100 mg via ORAL
  Filled 2018-07-01 (×9): qty 1

## 2018-07-01 MED ORDER — LACTATED RINGERS IV SOLN
INTRAVENOUS | Status: DC | PRN
  Administered 2018-07-01 (×3): via INTRAVENOUS

## 2018-07-01 MED ORDER — SODIUM CHLORIDE 0.9 % IV SOLN: 500.0000 mL | Freq: Once | INTRAVENOUS | Status: DC | PRN

## 2018-07-01 MED ORDER — OXYCODONE HCL 5 MG PO TABS: 5.0000 mg | ORAL_TABLET | Freq: Once | ORAL | Status: DC | PRN

## 2018-07-01 MED ORDER — OXYCODONE-ACETAMINOPHEN 5-325 MG PO TABS
1.0000 | ORAL_TABLET | ORAL | Status: DC | PRN
  Administered 2018-07-01 – 2018-07-10 (×31): 2 via ORAL
  Filled 2018-07-01 (×32): qty 2

## 2018-07-01 MED ORDER — MORPHINE SULFATE (PF) 2 MG/ML IV SOLN
2.0000 mg | INTRAVENOUS | Status: DC | PRN
  Administered 2018-07-02 – 2018-07-09 (×14): 2 mg via INTRAVENOUS
  Filled 2018-07-01 (×15): qty 1

## 2018-07-01 MED ORDER — PHENYLEPHRINE 40 MCG/ML (10ML) SYRINGE FOR IV PUSH (FOR BLOOD PRESSURE SUPPORT)
PREFILLED_SYRINGE | INTRAVENOUS | Status: DC | PRN
  Administered 2018-07-01 (×4): 80 ug via INTRAVENOUS

## 2018-07-01 MED ORDER — METOPROLOL TARTRATE 5 MG/5ML IV SOLN: 2.0000 mg | INTRAVENOUS | Status: DC | PRN

## 2018-07-01 MED ORDER — 0.9 % SODIUM CHLORIDE (POUR BTL) OPTIME
TOPICAL | Status: DC | PRN
  Administered 2018-07-01: 1000 mL
  Administered 2018-07-01: 2000 mL

## 2018-07-01 MED ORDER — PANTOPRAZOLE SODIUM 40 MG PO TBEC
40.0000 mg | DELAYED_RELEASE_TABLET | Freq: Every day | ORAL | Status: DC
  Administered 2018-07-01 – 2018-07-10 (×10): 40 mg via ORAL
  Filled 2018-07-01 (×10): qty 1

## 2018-07-01 MED ORDER — SODIUM CHLORIDE 0.9% IV SOLUTION: Freq: Once | INTRAVENOUS | Status: DC

## 2018-07-01 MED ORDER — CLOPIDOGREL BISULFATE 75 MG PO TABS
75.0000 mg | ORAL_TABLET | Freq: Every day | ORAL | Status: DC
  Administered 2018-07-02 – 2018-07-10 (×9): 75 mg via ORAL
  Filled 2018-07-01 (×9): qty 1

## 2018-07-01 SURGICAL SUPPLY — 86 items
BANDAGE ACE 4X5 VEL STRL LF (GAUZE/BANDAGES/DRESSINGS) ×4 IMPLANT
BANDAGE ELASTIC 4 VELCRO ST LF (GAUZE/BANDAGES/DRESSINGS) ×1 IMPLANT
BANDAGE ESMARK 6X9 LF (GAUZE/BANDAGES/DRESSINGS) IMPLANT
BLADE AVERAGE 25X9 (BLADE) ×4 IMPLANT
BLADE SAW SGTL 81X20 HD (BLADE) IMPLANT
BNDG ESMARK 6X9 LF (GAUZE/BANDAGES/DRESSINGS) ×4
BNDG GAUZE ELAST 4 BULKY (GAUZE/BANDAGES/DRESSINGS) ×4 IMPLANT
CANISTER SUCT 3000ML PPV (MISCELLANEOUS) ×4 IMPLANT
CANNULA VESSEL 3MM 2 BLNT TIP (CANNULA) ×2 IMPLANT
CATH EMB 2FR 60CM (CATHETERS) ×1 IMPLANT
CLIP VESOCCLUDE MED 24/CT (CLIP) ×4 IMPLANT
CLIP VESOCCLUDE MED 6/CT (CLIP) ×2 IMPLANT
CLIP VESOCCLUDE SM WIDE 24/CT (CLIP) ×5 IMPLANT
CLIP VESOCCLUDE SM WIDE 6/CT (CLIP) ×1 IMPLANT
COVER SURGICAL LIGHT HANDLE (MISCELLANEOUS) ×4 IMPLANT
COVER WAND RF STERILE (DRAPES) ×4 IMPLANT
CUFF TOURNIQUET SINGLE 34IN LL (TOURNIQUET CUFF) ×1 IMPLANT
DERMABOND ADVANCED (GAUZE/BANDAGES/DRESSINGS) ×1
DERMABOND ADVANCED .7 DNX12 (GAUZE/BANDAGES/DRESSINGS) ×3 IMPLANT
DRAIN CHANNEL 15F RND FF W/TCR (WOUND CARE) IMPLANT
DRAPE C-ARM 42X72 X-RAY (DRAPES) ×1 IMPLANT
DRAPE HALF SHEET 40X57 (DRAPES) ×5 IMPLANT
DRSG COVADERM 4X14 (GAUZE/BANDAGES/DRESSINGS) ×1 IMPLANT
DRSG COVADERM 4X6 (GAUZE/BANDAGES/DRESSINGS) ×1 IMPLANT
DRSG COVADERM 4X8 (GAUZE/BANDAGES/DRESSINGS) ×1 IMPLANT
ELECT BLADE 6.5 EXT (BLADE) ×1 IMPLANT
ELECT REM PT RETURN 9FT ADLT (ELECTROSURGICAL) ×4
ELECTRODE REM PT RTRN 9FT ADLT (ELECTROSURGICAL) ×3 IMPLANT
EVACUATOR SILICONE 100CC (DRAIN) IMPLANT
GAUZE 4X4 16PLY RFD (DISPOSABLE) ×2 IMPLANT
GAUZE SPONGE 4X4 12PLY STRL (GAUZE/BANDAGES/DRESSINGS) ×4 IMPLANT
GLOVE BIO SURGEON STRL SZ 6.5 (GLOVE) ×4 IMPLANT
GLOVE BIO SURGEON STRL SZ7 (GLOVE) ×1 IMPLANT
GLOVE BIO SURGEON STRL SZ7.5 (GLOVE) ×6 IMPLANT
GLOVE BIOGEL PI IND STRL 6.5 (GLOVE) IMPLANT
GLOVE BIOGEL PI IND STRL 7.0 (GLOVE) IMPLANT
GLOVE BIOGEL PI IND STRL 8 (GLOVE) IMPLANT
GLOVE BIOGEL PI INDICATOR 6.5 (GLOVE) ×3
GLOVE BIOGEL PI INDICATOR 7.0 (GLOVE) ×2
GLOVE BIOGEL PI INDICATOR 8 (GLOVE) ×1
GLOVE ECLIPSE 6.5 STRL STRAW (GLOVE) ×2 IMPLANT
GLOVE SURG SS PI 6.5 STRL IVOR (GLOVE) ×2 IMPLANT
GOWN STRL REUS W/ TWL LRG LVL3 (GOWN DISPOSABLE) ×6 IMPLANT
GOWN STRL REUS W/ TWL XL LVL3 (GOWN DISPOSABLE) ×3 IMPLANT
GOWN STRL REUS W/TWL LRG LVL3 (GOWN DISPOSABLE) ×5
GOWN STRL REUS W/TWL XL LVL3 (GOWN DISPOSABLE) ×1
HEMOSTAT SNOW SURGICEL 2X4 (HEMOSTASIS) ×1 IMPLANT
HEMOSTAT SPONGE AVITENE ULTRA (HEMOSTASIS) IMPLANT
INSERT FOGARTY SM (MISCELLANEOUS) IMPLANT
KIT BASIN OR (CUSTOM PROCEDURE TRAY) ×4 IMPLANT
KIT TURNOVER KIT B (KITS) ×4 IMPLANT
NDL HYPO 25GX1X1/2 BEV (NEEDLE) IMPLANT
NEEDLE HYPO 25GX1X1/2 BEV (NEEDLE) IMPLANT
NS IRRIG 1000ML POUR BTL (IV SOLUTION) ×8 IMPLANT
PACK GENERAL/GYN (CUSTOM PROCEDURE TRAY) ×4 IMPLANT
PACK PERIPHERAL VASCULAR (CUSTOM PROCEDURE TRAY) ×4 IMPLANT
PAD ABD 8X10 STRL (GAUZE/BANDAGES/DRESSINGS) ×1 IMPLANT
PAD ARMBOARD 7.5X6 YLW CONV (MISCELLANEOUS) ×8 IMPLANT
PENCIL BUTTON HOLSTER BLD 10FT (ELECTRODE) ×1 IMPLANT
SET MICROPUNCTURE 5F STIFF (MISCELLANEOUS) ×1 IMPLANT
SPECIMEN JAR SMALL (MISCELLANEOUS) ×4 IMPLANT
SPONGE LAP 18X18 RF (DISPOSABLE) ×3 IMPLANT
SPONGE LAP 18X18 X RAY DECT (DISPOSABLE) ×1 IMPLANT
STAPLER SKIN 35 WIDE (STAPLE) ×2 IMPLANT
STAPLER VISISTAT 35W (STAPLE) ×1 IMPLANT
SUT ETHILON 3 0 PS 1 (SUTURE) ×4 IMPLANT
SUT MNCRL AB 4-0 PS2 18 (SUTURE) ×8 IMPLANT
SUT PROLENE 5 0 C 1 24 (SUTURE) ×14 IMPLANT
SUT PROLENE 6 0 BV (SUTURE) ×12 IMPLANT
SUT PROLENE 7 0 BV 1 (SUTURE) IMPLANT
SUT SILK 2 0 SH (SUTURE) ×4 IMPLANT
SUT SILK 3 0 (SUTURE) ×2
SUT SILK 3-0 18XBRD TIE 12 (SUTURE) IMPLANT
SUT VIC AB 2-0 CT1 27 (SUTURE) ×2
SUT VIC AB 2-0 CT1 TAPERPNT 27 (SUTURE) ×6 IMPLANT
SUT VIC AB 3-0 SH 27 (SUTURE) ×6
SUT VIC AB 3-0 SH 27X BRD (SUTURE) ×6 IMPLANT
SYR 30ML LL (SYRINGE) ×1 IMPLANT
SYR CONTROL 10ML LL (SYRINGE) IMPLANT
SYRINGE 3CC LL L/F (MISCELLANEOUS) ×1 IMPLANT
TAPE UMBILICAL COTTON 1/8X30 (MISCELLANEOUS) IMPLANT
TOWEL GREEN STERILE (TOWEL DISPOSABLE) ×8 IMPLANT
TOWEL GREEN STERILE FF (TOWEL DISPOSABLE) ×4 IMPLANT
TRAY FOLEY MTR SLVR 16FR STAT (SET/KITS/TRAYS/PACK) ×4 IMPLANT
UNDERPAD 30X30 (UNDERPADS AND DIAPERS) ×4 IMPLANT
WATER STERILE IRR 1000ML POUR (IV SOLUTION) ×4 IMPLANT

## 2018-07-01 NOTE — Op Note (Signed)
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 Donzetta Matters, MD Assistant: Ruta Hinds, MD; Leontine Locket, PA; [facility], [place] Procedure Performed: 1.  Harvest left greater saphenous vein 2.  Left peroneal artery thrombectomy 3.  Left SFA to peroneal artery bypass with non-reversed greater saphenous vein 4.  Left lower extremity angiogram 5.  Left SFA to anterior tibial artery bypass with ipsilateral non-reversed greater saphenous vein 6.  Amputation left toes 2 through 5  Indications: 68 year old male has undergone stenting of the left popliteal artery that subsequently occluded he is undergone attempted percutaneous thrombectomy on multiple occasions with no success.  He now has gangrenous changes to his left toes and is indicated for bypass with possible amputation of affected toes.  Findings: The SFA was patent to the level of the mid thigh just cephalad to the existing stent.  Distally there was significant venous stasis changes and significant bleeding from each of his tibial veins.  After bypass the peroneal artery we did not have any runoff angiogram demonstrated anterior tibial artery was actually a dominant runoff.  At this time we expose the anterior tibial artery performed bypass.  At completion we did have palpable anterior tibial pulse distally with great signal in the foot on the DP.  Toes 2 through 5 were grossly infected there was purulence.  These were amputated and we debrided back to healthy tissue where we had adequate bleeding to expect healing.   Procedure:  The patient was identified in the holding area and taken to the operating room where is placed supine the operative table and general anesthesia was induced.  He was sterilely prepped and draped in the left lower extremity in the usual fashion,  antibiotics were administered and a timeout was called.  We began using ultrasound to identify his common femoral artery as well as a saphenous vein and these were more throughout his leg.  We also identified the most cephalad aspect of the stent in the superficial femoral artery with ultrasound this was marked.  We then made a transverse incision in the groin dissected down onto the common femoral artery.  I placed a vessel loop around this fully not expecting to use this for bypass.  In the same wound identified the saphenous vein.  There are multiple branches which were divided between clips and ties all the way to the saphenofemoral junction.  We then traced the vein through series of skip incisions to above the knee dividing branches between clips and ties.  I then turned my attention below the knee where the skin was significantly indurated with venous stasis changes.  Using ultrasound to identify the saphenous vein and then made a incision over this.  We dissected out the vein for several centimeters.  There were multiple varicosities which did bleed easily these were treated with cautery and clips.  When I got to the most distal aspect of the saphenous vein that was suitable I transected there clipped it.  We then tied it off at the saphenofemoral junction and was passed to the back table for preparation.  Through that same distal incision we then began to expose the peroneal artery.  We identified the posterior tibial and drop this posterior attempted to identify the peroneal artery distally.  Use Doppler but could not identify it.  We then traced up to the below-knee popliteal artery  where there was significant scar tissue likely from either the previous endovascular interventions or the recent DVT.  We then traced the peroneal vein this had to be divided.  We are able to identify her peroneal artery and traced this is distally to go through our incision.  Then turned our attention to 1 of our skip  incisions through which we are able to dissected out our SFA which was noted to be soft and amenable for bypass takeoff.  At this time we then prepared our vein and our SFA proximal to the stent.  We placed the tunnel but from our peroneal artery posterior to the knee.  This was quite difficult required cautery given the significant scar tissue.  We were able to tunnel up to the level of our SFA.  Patient was fully heparinized at this time.  We then clamped our SFA distally and proximally opened longitudinally was noted be free of disease.  The vein was spatulated and a non-reversed fashion and was sewn end-to-side with 5-0 Prolene suture.  After completing anastomosis we released the clamps we did have flow to the first valve in the vein.  We then used a valvulotome to lyse all the valves until we had pulsatile antegrade bleeding and distally the vein graft was clamped marked for orientation.  It was then tunneled maintaining this orientation down to the level of our peroneal exposure.  We straighten the leg and trimmed our vein graft to size.  We then exsanguinated the leg with an Esmarch and inflated the tourniquet to 250 mmHg above the knee.  Peroneal artery was opened longitudinally where there was significant disease.  I passed a 2 Fogarty distally although I could not pass 1 proximally.  I returned gelatinous subacute appearing thrombus.  I did have some backbleeding despite the tourniquet being inflated.  I then spatulated my vein graft and sewed it end-to-side with 6-0 Prolene suture.  Prior to the completion of this anastomosis we allowed tourniquet down and allowed flushing all directions.  Upon completion there was a high outflow resistance signal with Doppler although there was pulsatility throughout our vein graft.  With this I elected to perform angiography.  I cannulated the vein graft with micropuncture needle followed by wire and sheath.  We then perform angiography which demonstrated that the  actual runoff vessel was the anterior tibial artery there was no distal flow from the peroneal.  We then made a lateral incision without re-heparinizing over the anterior tibialis muscle.  We dissected down to the neurovascular bundle and identified our anterior tibial artery.  Again there was significant bleeding from the venous structures and these were ligated.  Anterior tibial artery there was noted to be's soft likely amenable to bypass.  I then tunneled from the medial to the lateral incision and opened the interosseous membrane for several centimeters.  We tied off our bypass distally to the peroneal artery and divided it clamped approximately and tunneled it maintaining orientation through the interosseous membrane.  I again exsanguinated the leg with Esmarch and inflated tourniquet to 250 mmHg above the knee.  The anterior tibial artery was opened longitudinally was noted to be free of disease.  The vein graft was spatulated and sewn end-to-side with 6-0 Prolene suture.  Prior to completion of this anastomosis we allowed flushing all directions.  Upon completion there was very good biphasic signal in the vein graft as well as distally in the anterior tibial artery and then we later had signal in  the dorsalis pedis on to the foot.  Satisfied with this 50 mg of protamine was administered.  We obtained hemostasis and irrigated all wounds.  Wounds above the knee were closed in layers of Vicryl and Monocryl.  Below the knee given the significant bleeding from the vein we elected to staple medially and laterally.  After sterile dressing in place we then turned our attention to the infected toes.  Initially I made a incision around the toes 3 through 5.  There was purulence at the base the second toe and so this was removed as well.  I removed all the toes down to the joint space then used a bone clip to remove the toes.  Rongeur was used to smooth the metatarsal heads.  I irrigated the wound copiously and obtain  hemostasis.  Wet-to-dry dressing was placed in the foot.  He was then allowed awaken anesthesia having tolerated procedure without immediate complication.  EBL: 650 cc  Blood administered: 2 units prbc's    Christinea Brizuela C. Donzetta Matters, MD [facility] Office: [phone] Pager: [phone]

## 2018-07-01 NOTE — Progress Notes (Signed)
[facility]                                                                              Patient Demographics  [name], is a 68 y.o. male, DOB - 28-Nov-1950, TJQ:[id number]  Admit date - 06/27/2018   Admitting Physician Edwin Dada, MD  Outpatient Primary MD for the patient is [name], MD  Outpatient specialists:   LOS - 4  days   Medical records reviewed and are as summarized below:    Chief Complaint  Patient presents with  . Foot Pain  . 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  The patient was treated for restenosis of his left SFA in Oct complicated by in stent thrombosis and requiring 2 repeat angiograms for stenting. 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 Two days ago he was seen in wound care, had a new ulcer on the left THIRD digit, was started on Levaquin which he has taken. On the day of admission, this third digit was completely purple, so he was sent from wound care center for IV antibiotics.     Assessment & Plan   Gangrene, left third toe, new and left fifth toe, chronic with severe PVD with critical left lower extremity ischemia with necrotic toes -Continue IV antibiotics -Cardiology following -ABIs that showed occluded stents -MRI of the left foot showed osteonecrosis of distal phalanx of the little toe, marrow edema in the proximal phalanx of fifth toe consistent with osteomyelitis, bony destructive changes of the tuft of the third toe consistent with osteomyelitis, soft tissue gas about the distal phalanx of the third toe consistent with cellulitis. -Vascular surgery consulted, angiogram done on 1/6 -Patient underwent amputation left toes 2 through  5, left SFA to peroneal artery bypass, left SFA to anterior tibial artery bypass, angiogram -Seen after surgery, currently no acute issues.  Feeling a little bit dry, will place on gentle IV fluid hydration until taking p.o.'s.   Diabetes mellitus uncontrolled with diabetic foot ulcer, IDDM -Hemoglobin A1c 6.5 -CBGs controlled, continue Lantus, sliding scale insulin, NovoLog meal coverage   Essential hypertension -BP stable, continue Coreg, Cozaar, triamterene/HCTZ -Continue hydralazine IV as needed with parameters  Gout - continue allopurinol, colchicine  Chronic anemia, likely due to PVD -baseline appears to be around 8 -Anemia panel suggestive of anemia of chronic disease -Hemoglobin 8.6, follow H&H closely post op  CKD stage III -Baseline creatinine 1.5-1.7, currently at baseline  Code Status: Full CODE STATUS DVT Prophylaxis: On Xarelto Family Communication: Discussed in detail with the patient, all imaging results, lab results explained to the patient, patient's wife, daughter and the son at the bedside    Disposition Plan: When cleared by vascular surgery  Time Spent in minutes   35 minutes  Procedures:  Procedure Performed: 06/30/2018 1.  Ultrasound-guided access of the right  common femoral artery 2.  Aortogram 3.  Left lower extremity arteriogram with selection of second order branches 4.  45 minutes of monitored moderate conscious sedation time 5.  Mynx closure of the right common femoral artery  Consultants:   Cardiology Vascular surgery  Antimicrobials:      Medications  Scheduled Meds: . sodium chloride   Intravenous Once  . [START ON 07/02/2018] allopurinol  100 mg Oral Daily  . carvedilol  12.5 mg Oral Q1400  . Chlorhexidine Gluconate Cloth  6 each Topical Q0600  . [START ON 07/02/2018] clopidogrel  75 mg Oral Daily  . [START ON 07/02/2018] colchicine  0.6 mg Oral BID  . [START ON 07/02/2018] docusate sodium  100 mg Oral Daily  . dorzolamide-timolol   1 drop Both Eyes BID  . feeding supplement (GLUCERNA SHAKE)  237 mL Oral BID BM  . feeding supplement (PRO-STAT SUGAR FREE 64)  30 mL Oral Daily  . insulin aspart  0-15 Units Subcutaneous TID WC  . insulin aspart  0-5 Units Subcutaneous QHS  . insulin aspart  3 Units Subcutaneous TID WC  . insulin glargine  15 Units Subcutaneous QHS  . latanoprost  1 drop Both Eyes QHS  . [START ON 07/03/2018] losartan  100 mg Oral Q1400  . mupirocin ointment  1 application Nasal BID  . pantoprazole  40 mg Oral Daily  . pravastatin  40 mg Oral QHS  . pregabalin  50-100 mg Oral QHS  . sodium chloride flush  3 mL Intravenous Q12H  . triamterene-hydrochlorothiazide  1 tablet Oral Q1400   Continuous Infusions: . sodium chloride Stopped (06/30/18 0952)  . sodium chloride    . cefTRIAXone (ROCEPHIN)  IV Stopped (06/30/18 1649)  . metronidazole 500 mg (07/01/18 0607)   PRN Meds:.sodium chloride, acetaminophen **OR** acetaminophen, alum & mag hydroxide-simeth, guaiFENesin-dextromethorphan, hydrALAZINE, labetalol, metoprolol tartrate, morphine injection, ondansetron **OR** ondansetron (ZOFRAN) IV, oxyCODONE-acetaminophen, phenol   Antibiotics   Anti-infectives (From admission, onward)   Start     Dose/Rate Route Frequency Ordered Stop   07/01/18 0900  vancomycin (VANCOCIN) 1,500 mg in sodium chloride 0.9 % 500 mL IVPB     1,500 mg 250 mL/hr over 120 Minutes Intravenous To [facility] 07/01/18 0855 07/01/18 1023   06/29/18 0900  vancomycin (VANCOCIN) 1,500 mg in sodium chloride 0.9 % 500 mL IVPB  Status:  Discontinued     1,500 mg 250 mL/hr over 120 Minutes Intravenous Every 24 hours 06/28/18 1443 07/01/18 0855   06/28/18 1500  metroNIDAZOLE (FLAGYL) IVPB 500 mg     500 mg 100 mL/hr over 60 Minutes Intravenous Every 8 hours 06/28/18 1443     06/28/18 1500  cefTRIAXone (ROCEPHIN) 2 g in sodium chloride 0.9 % 100 mL IVPB     2 g 200 mL/hr over 30 Minutes Intravenous Every 24 hours 06/28/18 1443      06/28/18 1000  vancomycin (VANCOCIN) 1,500 mg in sodium chloride 0.9 % 500 mL IVPB  Status:  Discontinued     1,500 mg 250 mL/hr over 120 Minutes Intravenous Every 24 hours 06/27/18 1536 06/28/18 1435   06/27/18 1500  cefTRIAXone (ROCEPHIN) 2 g in sodium chloride 0.9 % 100 mL IVPB  Status:  Discontinued     2 g 200 mL/hr over 30 Minutes Intravenous Every 24 hours 06/27/18 1414 06/28/18 1435   06/27/18 1500  metroNIDAZOLE (FLAGYL) IVPB 500 mg  Status:  Discontinued     500 mg 100 mL/hr over 60 Minutes Intravenous Every 8 hours  06/27/18 1414 06/28/18 1435   06/27/18 1015  vancomycin (VANCOCIN) 2,000 mg in sodium chloride 0.9 % 500 mL IVPB     2,000 mg 250 mL/hr over 120 Minutes Intravenous  Once 06/27/18 1013 06/27/18 1247        Subjective:   [name] was seen and examined today.  Seen after the surgery, feeling some chills and dry.  Otherwise no acute issues family at the bedside.  Patient denies dizziness, chest pain, shortness of breath, abdominal pain, N/V/D/C.    Objective:   Vitals:   07/01/18 1755 07/01/18 1810 07/01/18 1825 07/01/18 1840  BP: (!) 152/73 126/86 118/82 136/90  Pulse: 83 82 86 84  Resp: 18 18 18 18   Temp:   (!) 97.5 F (36.4 C) 97.8 F (36.6 C)  TempSrc:    Oral  SpO2: 100% 100% 98% 100%  Weight:      Height:        Intake/Output Summary (Last 24 hours) at 07/01/2018 1859 Last data filed at 07/01/2018 1831 Gross per 24 hour  Intake 5557.38 ml  Output 1735 ml  Net 3822.38 ml     Wt Readings from Last 3 Encounters:  06/27/18 104.3 kg  05/05/18 114.8 kg  04/22/18 113.4 kg   Physical Exam  General: Alert and oriented x 3, NAD, dry mucosal membrane  Eyes:   HEENT:  Atraumatic, normocephalic  Cardiovascular: S1 S2 clear, no murmurs, RRR.   Respiratory: CTAB, no wheezing, rales or rhonchi  Gastrointestinal: Soft, nontender, nondistended, NBS  Ext: No pedal edema right, left foot dressing intact  Neuro: no new deficits  Musculoskeletal:  No cyanosis, clubbing  Skin: Left foot dressing intact  Psych: Normal affect and demeanor, alert and oriented x3     Data Reviewed:  I have personally reviewed following labs and imaging studies  Micro Results Recent Results (from the past 240 hour(s))  MRSA PCR Screening     Status: Abnormal   Collection Time: 06/27/18  4:06 PM  Result Value Ref Range Status   MRSA by PCR POSITIVE (A) NEGATIVE Final    Comment:        The GeneXpert MRSA Assay (FDA approved for NASAL specimens only), is one component of a comprehensive MRSA colonization surveillance program. It is not intended to diagnose MRSA infection nor to guide or monitor treatment for MRSA infections. RESULT CALLED TO, READ BACK BY AND VERIFIED WITHConception Chancy RN [phone] BY GF Performed at [facility], [place] [address], [place],  [address]     Radiology Reports Mr Foot Left Wo Contrast  Result Date: 06/29/2018 CLINICAL DATA:  Diabetic patient with a skin ulceration on the third toe of the left foot. Left foot pain, redness and swelling. The patient's little toe appears necrotic. EXAM: MRI OF THE LEFT FOOT WITHOUT CONTRAST TECHNIQUE: Multiplanar, multisequence MR imaging of the left foot was performed. No intravenous contrast was administered. COMPARISON:  Plain films left foot 06/23/2018. FINDINGS: Bones/Joint/Cartilage Markedly decreased T1 and T2 signal throughout the distal phalanx of the little toe are most consistent with osteonecrosis. There is increased T2 signal throughout the proximal phalanx of the great toe consistent with osteomyelitis. The distal phalanx of the third toe is not well seen but there appears to be bony destructive change of the tuft. Innumerable punctate foci of decreased T1 and T2 signal in soft tissues about the distal phalanx are consistent with the presence of gas. The third toe is otherwise normal in appearance. Mild  hallux valgus and mild first MTP osteoarthritis are seen. Imaged  bones otherwise appear normal. Ligaments Intact. Muscles and Tendons Marked atrophy of intrinsic musculature the foot is identified. No intramuscular fluid collection. Soft tissues Diffuse subcutaneous edema is present about the dorsum of the foot. IMPRESSION: Markedly decreased T1 and T2 signal throughout the distal phalanx of the little toe likely due to osteonecrosis. Marrow edema in the proximal phalanx of the fifth toe is consistent with osteomyelitis. Bony destructive change in the tuft of the third toe consistent with osteomyelitis. No other evidence of osteomyelitis is identified. Soft tissue gas about the distal phalanx of the third toe consistent with skin ulceration and cellulitis. Negative for abscess or septic joint. Diffuse subcutaneous edema about the foot most consistent with cellulitis. Electronically Signed   By: Inge Rise M.D.   On: 06/29/2018 13:09   Dg Ang/ext/uni/or Left  Result Date: 07/01/2018 CLINICAL DATA:  68 year old male with a history of critical limb ischemia EXAM: LEFT ANG/EXT/UNI/ OR CONTRAST:  Op note FLUOROSCOPY TIME:  Op note COMPARISON:  None. FINDINGS: Limited intraoperative angiogram images of left lower extremity demonstrating partial opacification of the tibial vasculature. IMPRESSION: Limited intraoperative fluoroscopic images of left-sided distal bypass. Please refer to the dictated operative report for full details of intraoperative findings and procedure. Electronically Signed   By: Corrie Mckusick D.O.   On: 07/01/2018 16:32   Dg Foot Complete Left  Result Date: 06/23/2018 CLINICAL DATA:  Left foot ulcers. EXAM: LEFT FOOT - COMPLETE 3+ VIEW COMPARISON:  None. FINDINGS: Old distal left fifth metatarsal fracture is noted. Large soft tissue ulceration is seen involving distal portion of third toe with underlying lytic destruction of the distal tuft of third distal phalanx consistent with osteomyelitis. Joint spaces are intact. IMPRESSION: Soft tissue ulceration  involving distal soft tissues of third toe with underlying lytic destruction of distal tuft of third distal phalanx consistent with osteomyelitis. Electronically Signed   By: Marijo Conception, M.D.   On: 06/23/2018 15:54   Vas [place] Lower Extremity Saphenous Vein Mapping  Result Date: 06/30/2018 LOWER EXTREMITY VEIN MAPPING Indications: PAD  Performing Technologist: Oliver Hum RVT  Examination Guidelines: A complete evaluation includes B-mode imaging, spectral Doppler, color Doppler, and power Doppler as needed of all accessible portions of each vessel. Bilateral testing is considered an integral part of a complete examination. Limited examinations for reoccurring indications may be performed as noted. +---------------+-----------+----------------------+---------------+-----------+   RT Diameter  RT Findings         GSV            LT Diameter  LT Findings      (cm)                                            (cm)                  +---------------+-----------+----------------------+---------------+-----------+      0.41                     Saphenofemoral         0.35  Junction                                  +---------------+-----------+----------------------+---------------+-----------+      0.29                     Proximal thigh         0.56       branching  +---------------+-----------+----------------------+---------------+-----------+      0.28                       Mid thigh            0.47       branching  +---------------+-----------+----------------------+---------------+-----------+      0.23       branching      Distal thigh          0.51       branching  +---------------+-----------+----------------------+---------------+-----------+      0.21                          Knee              0.57       branching  +---------------+-----------+----------------------+---------------+-----------+      0.09                        Prox calf            0.45       branching  +---------------+-----------+----------------------+---------------+-----------+      0.08       branching        Mid calf            0.22                  +---------------+-----------+----------------------+---------------+-----------+      0.16                      Distal calf           0.31       branching  +---------------+-----------+----------------------+---------------+-----------+ Diagnosing physician: Monica Martinez MD Electronically signed by Monica Martinez MD on 06/30/2018 at 4:18:13 PM.    Final    Vas [place] Lower Extremity Arterial Duplex  Result Date: 06/29/2018 LOWER EXTREMITY ARTERIAL DUPLEX STUDY Indications: Unsuccessful revascularization 05/09/18 previous SFA distal and              popliteal stenting with stent thrombosis.  Current ABI: Previous ABI 05/02/18 Right 0.82 Left 0.43 Performing Technologist: June Leap RDMS, RVT  Examination Guidelines: A complete evaluation includes B-mode imaging, spectral Doppler, color Doppler, and power Doppler as needed of all accessible portions of each vessel. Bilateral testing is considered an integral part of a complete examination. Limited examinations for reoccurring indications may be performed as noted.  Left Duplex Findings: +-----------+--------+-----+--------+----------+-------------------------------+            PSV cm/sRatioStenosisWaveform  Comments                        +-----------+--------+-----+--------+----------+-------------------------------+ CFA Prox   89                   biphasic                                  +-----------+--------+-----+--------+----------+-------------------------------+  DFA        62                   biphasic                                  +-----------+--------+-----+--------+----------+-------------------------------+ SFA Prox   101                  biphasic                                   +-----------+--------+-----+--------+----------+-------------------------------+ SFA Mid    74                   biphasic                                  +-----------+--------+-----+--------+----------+-------------------------------+ SFA Distal 52                   biphasic  proximal part of stent is                                                 patent, collateral vessel is                                              noted just above where stent                                              occludes                        +-----------+--------+-----+--------+----------+-------------------------------+ POP Prox                                  occluded stent                  +-----------+--------+-----+--------+----------+-------------------------------+ POP Distal                                occluded stent                  +-----------+--------+-----+--------+----------+-------------------------------+ ATA Prox   44                   monophasic                                +-----------+--------+-----+--------+----------+-------------------------------+ ATA Mid                                   unable to image due to bandages +-----------+--------+-----+--------+----------+-------------------------------+ PTA Mid  monophasic                                +-----------+--------+-----+--------+----------+-------------------------------+ PTA Distal 41                   monophasic                                +-----------+--------+-----+--------+----------+-------------------------------+ PERO Mid                        occluded                                  +-----------+--------+-----+--------+----------+-------------------------------+ PERO Distal                     occluded                                  +-----------+--------+-----+--------+----------+-------------------------------+   Summary: Left: Left popliteal stent is occluded. Left peroneal artery is occluded. Details mentioned above. Incidental finding: left popliteal vein DVT.   See table(s) above for measurements and observations. Electronically signed by Ruta Hinds MD on 06/29/2018 at 12:35:35 PM.    Final     Lab Data:  CBC: Recent Labs  Lab 06/27/18 1045 06/28/18 0248 06/29/18 1119 06/30/18 0503 07/01/18 0243 07/01/18 1227 07/01/18 1349 07/01/18 1556  WBC 11.2* 9.7 9.9 9.9 10.6*  --   --   --   NEUTROABS 9.6*  --   --   --   --   --   --   --   HGB 9.2* 7.9* 8.2* 8.2* 8.6* 8.8* 6.8* 8.2*  HCT 30.0* 24.7* 26.0* 26.6* 27.3* 26.0* 20.0* 24.0*  MCV 76.9* 75.8* 74.7* 74.3* 74.8*  --   --   --   PLT 326 297 315 314 345  --   --   --    Basic Metabolic Panel: Recent Labs  Lab 06/27/18 1045 06/28/18 0248 06/29/18 1119 06/30/18 0503 07/01/18 0243 07/01/18 1227 07/01/18 1349 07/01/18 1556  NA 134* 134* 132* 132* 136 135 135 136  K 3.7 4.0 3.9 3.8 3.9 4.5 4.5 4.9  CL 96* 99 99 100 102  --   --   --   CO2 25 24 23 23 24   --   --   --   GLUCOSE 133* 87 178* 143* 180*  --   --   --   BUN 25* 18 17 17 15   --   --   --   CREATININE 1.64* 1.41* 1.29* 1.31* 1.23  --   --   --   CALCIUM 9.4 9.0 9.0 8.9 8.9  --   --   --    GFR: Estimated Creatinine Clearance: 73.9 mL/min (by C-G formula based on SCr of 1.23 mg/dL). Liver Function Tests: No results for input(s): AST, ALT, ALKPHOS, BILITOT, PROT, ALBUMIN in the last 168 hours. No results for input(s): LIPASE, AMYLASE in the last 168 hours. No results for input(s): AMMONIA in the last 168 hours. Coagulation Profile: No results for input(s): INR, PROTIME in the last 168 hours. Cardiac Enzymes: No results for input(s): CKTOTAL, CKMB, CKMBINDEX, TROPONINI in the last 168 hours. BNP (last 3 results)  No results for input(s): PROBNP in the last 8760 hours. HbA1C: No results for input(s): HGBA1C in the last 72 hours. CBG: Recent Labs  Lab 06/30/18 2117  07/01/18 0612 07/01/18 0800 07/01/18 1730 07/01/18 1843  GLUCAP 165* 138* 133* 216* 234*   Lipid Profile: No results for input(s): CHOL, HDL, LDLCALC, TRIG, CHOLHDL, LDLDIRECT in the last 72 hours. Thyroid Function Tests: No results for input(s): TSH, T4TOTAL, FREET4, T3FREE, THYROIDAB in the last 72 hours. Anemia Panel: No results for input(s): VITAMINB12, FOLATE, FERRITIN, TIBC, IRON, RETICCTPCT in the last 72 hours. Urine analysis:    Component Value Date/Time   COLORURINE YELLOW 04/29/2018 0820   APPEARANCEUR CLEAR 04/29/2018 0820   LABSPEC 1.013 04/29/2018 0820   PHURINE 5.0 04/29/2018 0820   GLUCOSEU 50 (A) 04/29/2018 0820   HGBUR NEGATIVE 04/29/2018 0820   BILIRUBINUR NEGATIVE 04/29/2018 0820   KETONESUR NEGATIVE 04/29/2018 0820   PROTEINUR NEGATIVE 04/29/2018 0820   UROBILINOGEN 0.2 12/01/2012 2135   NITRITE NEGATIVE 04/29/2018 0820   LEUKOCYTESUR NEGATIVE 04/29/2018 0820       M.D. [facility] 07/01/2018, 6:59 PM  Pager: [phone] Between 7am to 7pm - call Pager - [phone]  After 7pm go to [email or web address] - password [email or web address]  Call night coverage person covering after 7pm

## 2018-07-01 NOTE — Anesthesia Postprocedure Evaluation (Signed)
Anesthesia Post Note  Patient: [name]  Procedure(s) Performed: BYPASS GRAFT FEMORAL-PERONEAL LEFT USING LEFT NONREVERSED GREAT SAPHENOUS VEIN (Left ) LEFT 2ND, 3RD, 4TH, & 5TH TOE AMPUTATION (Left Foot) INTRA OPERATIVE ARTERIOGRAM LEFT LOWER EXTREMITY (Left Leg Lower) VEIN HARVEST LEFT GREAT SAPHENOUS (Left )     Anesthesia Post Evaluation  Last Vitals:  Vitals:   07/01/18 1825 07/01/18 1840  BP: 118/82 136/90  Pulse: 86 84  Resp: 18 18  Temp: (!) 36.4 C 36.6 C  SpO2: 98% 100%    Last Pain:  Vitals:   07/01/18 1840  TempSrc: Oral  PainSc:                  Welford Roche Cockfield JR

## 2018-07-01 NOTE — Progress Notes (Signed)
  Progress Note    07/01/2018 9:23 AM Day of Surgery  Subjective:  No overnight issues  Vitals:   07/01/18 0621 07/01/18 0623  BP:  (!) 143/86  Pulse:    Resp: 13   Temp: 99.3 F (37.4 C)   SpO2:  98%    Physical Exam: aaox3 Non labored respirations Palpable left femoral pulse Gangrenous changes to left 3rd and 5th toes with erythema on dorsum of his foot  CBC    Component Value Date/Time   WBC 10.6 (H) 07/01/2018 0243   RBC 3.65 (L) 07/01/2018 0243   HGB 8.6 (L) 07/01/2018 0243   HCT 27.3 (L) 07/01/2018 0243   PLT 345 07/01/2018 0243   MCV 74.8 (L) 07/01/2018 0243   MCH 23.6 (L) 07/01/2018 0243   MCHC 31.5 07/01/2018 0243   RDW 18.3 (H) 07/01/2018 0243   LYMPHSABS 0.6 (L) 06/27/2018 1045   MONOABS 0.9 06/27/2018 1045   EOSABS 0.1 06/27/2018 1045   BASOSABS 0.0 06/27/2018 1045    BMET    Component Value Date/Time   NA 136 07/01/2018 0243   K 3.9 07/01/2018 0243   CL 102 07/01/2018 0243   CO2 24 07/01/2018 0243   GLUCOSE 180 (H) 07/01/2018 0243   BUN 15 07/01/2018 0243   CREATININE 1.23 07/01/2018 0243   CALCIUM 8.9 07/01/2018 0243   GFRNONAA >60 07/01/2018 0243   GFRAA >60 07/01/2018 0243    INR    Component Value Date/Time   INR 0.93 12/01/2012 1908     Intake/Output Summary (Last 24 hours) at 07/01/2018 0923 Last data filed at 07/01/2018 0455 Gross per 24 hour  Intake 2365.26 ml  Output 1025 ml  Net 1340.26 ml     Assessment:  68 y.o. male is angiogram to evaluate occluded left SFA stent. Plan: OR today for left femoral to peroneal bypass with saphenous vein if suitable.  He will also get at least toes 3 through 5 amputation with possible open transmetatarsal amputation.  I have reviewed all relevant imaging and discussed findings with him and he agrees to proceed in the presence of his wife and son.   Taleisha Kaczynski C. Donzetta Matters, MD [facility] Office: [phone] Pager: [phone]  07/01/2018 9:23 AM

## 2018-07-01 NOTE — Progress Notes (Signed)
ANTICOAGULATION CONSULT NOTE - Follow Up Consult  Pharmacy Consult for Heparin Indication: DVT treatment  No Known Allergies  Vital Signs: Temp: 98.5 F (36.9 C) (01/06 2028) Temp Source: Oral (01/06 2028) BP: 136/83 (01/06 2028) Pulse Rate: 75 (01/06 2028)  Labs: Recent Labs    06/29/18 1119 06/29/18 2301 06/30/18 0503 06/30/18 0833 07/01/18 0243  HGB 8.2*  --  8.2*  --  8.6*  HCT 26.0*  --  26.6*  --  27.3*  PLT 315  --  314  --  345  APTT  --  54*  --  53* 63*  HEPARINUNFRC  --  0.65  --  0.38  --   CREATININE 1.29*  --  1.31*  --  1.23    Estimated Creatinine Clearance: 73.9 mL/min (by C-G formula based on SCr of 1.23 mg/dL).  Assessment: 68 year old male to begin heparin for VTE treatment Only on Xarelto 2.5 mg po BID dosing  1/7 AM update: aPTT is low this AM after re-start s/p arteriogram  Goal of Therapy:  Monitor platelets by anticoagulation protocol: Yes  Heparin level = 0.3 to 0.7 units/mL PTT = 66 to 102 seconds   Plan:  Inc heparin to 1750 units/hr 1300 aPTT/HL  Narda Bonds, PharmD, BCPS Clinical Pharmacist Phone: [phone]

## 2018-07-01 NOTE — Transfer of Care (Signed)
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) INTRA OPERATIVE ARTERIOGRAM LEFT LOWER EXTREMITY (Left Leg Lower) VEIN HARVEST LEFT GREAT SAPHENOUS (Left )  Patient Location: PACU  Anesthesia Type:General  Level of Consciousness: awake, alert  and oriented  Airway & Oxygen Therapy: Patient Spontanous Breathing and Patient connected to face mask oxygen  Post-op Assessment: Report given to RN and Post -op Vital signs reviewed and stable  Post vital signs: Reviewed and stable  Last Vitals:  Vitals Value Taken Time  BP 120/75 07/01/2018  6:28 PM  Temp 36.4 C 07/01/2018  6:25 PM  Pulse 85 07/01/2018  6:28 PM  Resp 16 07/01/2018  6:36 PM  SpO2 99 % 07/01/2018  6:36 PM  Vitals shown include unvalidated device data.  Last Pain:  Vitals:   07/01/18 1823  TempSrc:   PainSc: 4       Patients Stated Pain Goal: 3 (89/21/19 4174)  Complications: No apparent anesthesia complications

## 2018-07-01 NOTE — Anesthesia Procedure Notes (Signed)
Procedure Name: Intubation Date/Time: 07/01/2018 10:04 AM Performed by: Teressa Lower., CRNA Pre-anesthesia Checklist: Patient identified, Emergency Drugs available, Suction available and Patient being monitored Patient Re-evaluated:Patient Re-evaluated prior to induction Oxygen Delivery Method: Circle system utilized Preoxygenation: Pre-oxygenation with 100% oxygen Induction Type: IV induction Ventilation: Mask ventilation without difficulty Laryngoscope Size: Mac and 4 Grade View: Grade II Tube type: Oral Tube size: 7.5 mm Number of attempts: 1 Airway Equipment and Method: Stylet and Oral airway Placement Confirmation: ETT inserted through vocal cords under direct vision,  positive ETCO2 and breath sounds checked- equal and bilateral Secured at: 23 cm Tube secured with: Tape Dental Injury: Teeth and Oropharynx as per pre-operative assessment  Comments: Grade 2b view

## 2018-07-01 NOTE — Progress Notes (Signed)
CPAP at bedside patient places self on CPAP.  RT assistance not needed at this time.

## 2018-07-01 NOTE — Progress Notes (Signed)
  Day of Surgery Note    Subjective:  In pacu-no complaints   Vitals:   07/01/18 0621 07/01/18 0623  BP:  (!) 143/86  Pulse:    Resp: 13   Temp: 99.3 F (37.4 C)   SpO2:  98%    Incisions:   Lower leg incisions with bandages with scant bloody drainage; upper leg incisions are clean and dry; toe amp dressing is clean and dry Extremities:  Brisk left AT doppler signal Cardiac:  regular Lungs:   Non labored    Assessment/Plan:  This is a 68 y.o. male who is s/p  Left SFA to AT bypass with non reversed saphenous vein   -pt with brisk left AT doppler signal -if hgb stable tomorrow, will restart heparin per pharmacy for DVT tx.  -to [place] when bed available.    Leontine Locket, PA-C 07/01/2018 5:27 PM [phone]

## 2018-07-02 ENCOUNTER — Encounter (HOSPITAL_COMMUNITY): Payer: Self-pay | Admitting: Vascular Surgery

## 2018-07-02 ENCOUNTER — Inpatient Hospital Stay (HOSPITAL_COMMUNITY): Payer: BC Managed Care – PPO

## 2018-07-02 LAB — CBC
HCT: 20.9 % — ABNORMAL LOW (ref 39.0–52.0)
Hemoglobin: 7 g/dL — ABNORMAL LOW (ref 13.0–17.0)
MCH: 26.4 pg (ref 26.0–34.0)
MCHC: 33.5 g/dL (ref 30.0–36.0)
MCV: 78.9 fL — ABNORMAL LOW (ref 80.0–100.0)
Platelets: 248 10*3/uL (ref 150–400)
RBC: 2.65 MIL/uL — ABNORMAL LOW (ref 4.22–5.81)
RDW: 18.6 % — ABNORMAL HIGH (ref 11.5–15.5)
WBC: 9.1 10*3/uL (ref 4.0–10.5)
nRBC: 0 % (ref 0.0–0.2)

## 2018-07-02 LAB — GLUCOSE, CAPILLARY
Glucose-Capillary: 208 mg/dL — ABNORMAL HIGH (ref 70–99)
Glucose-Capillary: 219 mg/dL — ABNORMAL HIGH (ref 70–99)
Glucose-Capillary: 212 mg/dL — ABNORMAL HIGH (ref 70–99)
Glucose-Capillary: 164 mg/dL — ABNORMAL HIGH (ref 70–99)

## 2018-07-02 LAB — BASIC METABOLIC PANEL
Anion gap: 6 (ref 5–15)
BUN: 15 mg/dL (ref 8–23)
CO2: 21 mmol/L — ABNORMAL LOW (ref 22–32)
Calcium: 8.2 mg/dL — ABNORMAL LOW (ref 8.9–10.3)
Chloride: 109 mmol/L (ref 98–111)
Creatinine, Ser: 1.23 mg/dL (ref 0.61–1.24)
GFR calc Af Amer: >60 mL/min (ref >60)
GFR calc non Af Amer: >60 mL/min (ref >60)
Glucose, Bld: 219 mg/dL — ABNORMAL HIGH (ref 70–99)
Potassium: 4.5 mmol/L (ref 3.5–5.1)
Sodium: 136 mmol/L (ref 135–145)

## 2018-07-02 LAB — HEMOGLOBIN AND HEMATOCRIT, BLOOD
HCT: 26.1 % — ABNORMAL LOW (ref 39.0–52.0)
Hemoglobin: 8.4 g/dL — ABNORMAL LOW (ref 13.0–17.0)

## 2018-07-02 LAB — PREPARE RBC (CROSSMATCH)

## 2018-07-02 LAB — APTT: aPTT: 34 seconds (ref 24–36)

## 2018-07-02 MED ORDER — FUROSEMIDE 10 MG/ML IJ SOLN
20.0000 mg | Freq: Once | INTRAMUSCULAR | Status: AC
  Administered 2018-07-02: 20 mg via INTRAVENOUS
  Filled 2018-07-02: qty 2

## 2018-07-02 MED ORDER — INSULIN GLARGINE 100 UNIT/ML ~~LOC~~ SOLN
20.0000 [IU] | Freq: Every day | SUBCUTANEOUS | Status: DC
  Administered 2018-07-02 – 2018-07-09 (×8): 20 [IU] via SUBCUTANEOUS
  Filled 2018-07-02 (×9): qty 0.2

## 2018-07-02 MED ORDER — INSULIN ASPART 100 UNIT/ML ~~LOC~~ SOLN
4.0000 [IU] | Freq: Three times a day (TID) | SUBCUTANEOUS | Status: DC
  Administered 2018-07-02 – 2018-07-10 (×25): 4 [IU] via SUBCUTANEOUS

## 2018-07-02 MED ORDER — SODIUM CHLORIDE 0.9% IV SOLUTION: Freq: Once | INTRAVENOUS | Status: DC

## 2018-07-02 NOTE — Progress Notes (Addendum)
Progress Note    07/02/2018 7:34 AM 1 Day Post-Op  Subjective:  Says he has some soreness in his thigh.  Says he woke up with results better than his expectations.   Afebrile HR 70's-80's NSR 938'H-829'H systolic 37% RA  Vitals:   07/02/18 0032 07/02/18 0558  BP: 125/81 105/63  Pulse: 75 77  Resp: 17 20  Temp: 98.2 F (36.8 C) 98.4 F (36.9 C)  SpO2: 99% 96%    Physical Exam: Cardiac:  regular Lungs:  Non labored Incisions:  Upper leg incisions clean and dry; medial and lateral incisions are clean with staples in tact.  Medial incision with scant bloody drainage distally.    Extremities:  Brisk left AT doppler signal.   CBC    Component Value Date/Time   WBC 9.1 07/02/2018 0506   RBC 2.65 (L) 07/02/2018 0506   HGB 7.0 (L) 07/02/2018 0506   HCT 20.9 (L) 07/02/2018 0506   PLT 248 07/02/2018 0506   MCV 78.9 (L) 07/02/2018 0506   MCH 26.4 07/02/2018 0506   MCHC 33.5 07/02/2018 0506   RDW 18.6 (H) 07/02/2018 0506   LYMPHSABS 0.6 (L) 06/27/2018 1045   MONOABS 0.9 06/27/2018 1045   EOSABS 0.1 06/27/2018 1045   BASOSABS 0.0 06/27/2018 1045    BMET    Component Value Date/Time   NA 136 07/02/2018 0506   K 4.5 07/02/2018 0506   CL 109 07/02/2018 0506   CO2 21 (L) 07/02/2018 0506   GLUCOSE 219 (H) 07/02/2018 0506   BUN 15 07/02/2018 0506   CREATININE 1.23 07/02/2018 0506   CALCIUM 8.2 (L) 07/02/2018 0506   GFRNONAA >60 07/02/2018 0506   GFRAA >60 07/02/2018 0506    INR    Component Value Date/Time   INR 0.93 12/01/2012 1908     Intake/Output Summary (Last 24 hours) at 07/02/2018 0734 Last data filed at 07/02/2018 0601 Gross per 24 hour  Intake 5617.29 ml  Output 2085 ml  Net 3532.29 ml     Assessment:  68 y.o. male is s/p:  1.  Harvest left greater saphenous vein 2.  Left peroneal artery thrombectomy 3.  Left SFA to peroneal artery bypass with non-reversed greater saphenous vein 4.  Left lower extremity angiogram 5.  Left SFA to anterior  tibial artery bypass with ipsilateral non-reversed greater saphenous vein 6.  Amputation left toes 2 through 5  1 Day Post-Op  Plan: -brisk left AT doppler signal -toe amp site clean without purulence-wet to dry dressing changes bid -acute surgical blood loss anemia-hgb 7.0-no active bleeding from incisions at this time.  Transfuse 2 units of blood  -labs in am -some leg swelling-will need leg elevation -PT/OT eval and treat -Okay to discontinue antibiotics after today's dosing -DVT prophylaxis:  Will d/w Dr. Donzetta Matters when to restart heparin   Leontine Locket, PA-C [facility] [phone] 07/02/2018 7:34 AM   I have independently interviewed and examined patient and agree with PA assessment and plan above.  He has a palpable anterior tibial pulse distally on the ankle.  The dressing is clean dry and intact and I have reviewed the images.  We will not need antibiotics after today.  Transfused today for acute blood loss anemia and will restart heparin tomorrow with plan for transition back to Xarelto prior to discharge.  Physical therapy today and he will need Darco shoe.  I discussed the plans with him and he demonstrates good understanding.  Brandon C. Donzetta Matters, MD [facility]  [facility] [place] Office: [phone] Pager: [phone]

## 2018-07-02 NOTE — Progress Notes (Signed)
OT Cancellation Note  Patient Details Name: [name] MRN: [id number] DOB: 1950-09-20   Cancelled Treatment:    Reason Eval/Treat Not Completed: Medical issues which prohibited therapy. Noted pt is to get 2 units PRBCs; Hgb 7.0.  Will either check back later or tomorrow.  Kaedyn Belardo 07/02/2018, 8:11 AM  Lesle Chris, OTR/L Acute Rehabilitation Services [phone] [facility] pager [phone] office 07/02/2018

## 2018-07-02 NOTE — Progress Notes (Signed)
PT Cancellation Note  Patient Details Name: [name] MRN: [id number] DOB: 12-Jul-1950   Cancelled Treatment:    Reason Eval/Treat Not Completed: Medical issues which prohibited therapy.  Symptomatic anemia is occurring with transfusion planned.  Follow up as pt is able to tolerate and time allows.  Ramond Dial 07/02/2018, 11:15 AM   Mee Hives, PT MS Acute Rehab Dept. Number: [place] [facility] [place]

## 2018-07-02 NOTE — Progress Notes (Signed)
Inpatient Diabetes Program Recommendations  AACE/ADA: New Consensus Statement on Inpatient Glycemic Control (2015)  Target Ranges:  Prepandial:   less than 140 mg/dL      Peak postprandial:   less than 180 mg/dL (1-2 hours)      Critically ill patients:  140 - 180 mg/dL   Lab Results  Component Value Date   GLUCAP 219 (H) 07/02/2018   HGBA1C 6.5 (H) 06/28/2018    Review of Glycemic Control Results for [name], [name] (MRN [id number]) as of 07/02/2018 12:32  Ref. Range 07/01/2018 17:30 07/01/2018 18:43 07/01/2018 21:18 07/02/2018 06:03 07/02/2018 11:27  Glucose-Capillary Latest Ref Range: 70 - 99 mg/dL 216 (H) 234 (H) 323 (H) 208 (H) 219 (H)   Diabetes history: DM2 Outpatient Diabetes medications: V-Go 30 with Novolog, metformin 1000 mg QAM, Victoza 1.2 mg QHS Current orders for Inpatient glycemic control: Lantus 20 units qd + Novolog 4 units tid meal coverage tid + Novolog moderate correction tid + hs  Inpatient Diabetes Program Recommendations:   Agree with orders to increase Lantus and meal coverage. If CBGs continue to be elevated, -Increase Lantus to 25 units -Increase Novolog correction to 5 units tid  Thank you, Nani Gasser. Hadleigh Felber, RN, MSN, CDE  Diabetes Coordinator Inpatient Glycemic Control Team Team Pager [phone] (8am-5pm) 07/02/2018 12:35 PM

## 2018-07-02 NOTE — Progress Notes (Signed)
[facility]                                                                              Patient Demographics  [name], is a 68 y.o. male, DOB - Jul 04, 1950, TIW:[id number]  Admit date - 06/27/2018   Admitting Physician Edwin Dada, MD  Outpatient Primary MD for the patient is [name], MD  Outpatient specialists:   LOS - 5  days   Medical records reviewed and are as summarized below:    Chief Complaint  Patient presents with  . Foot Pain  . 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  The patient was treated for restenosis of his left SFA in Oct complicated by in stent thrombosis and requiring 2 repeat angiograms for stenting. 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 Two days ago he was seen in wound care, had a new ulcer on the left THIRD digit, was started on Levaquin which he has taken. On the day of admission, this third digit was completely purple, so he was sent from wound care center for IV antibiotics.     Assessment & Plan   Gangrene, left third toe, new and left fifth toe, chronic with severe PVD with critical left lower extremity ischemia with necrotic toes -Continue IV antibiotics, ABIs that showed occluded stents -MRI of the left foot showed osteonecrosis of distal phalanx of the little toe, marrow edema in the proximal phalanx of fifth toe consistent with osteomyelitis, bony destructive changes of the tuft of the third toe consistent with osteomyelitis, soft tissue gas about the distal phalanx of the third toe consistent with cellulitis. -Vascular surgery consulted, angiogram done on 1/6, status post amputation left toes 2 through 5, left SFA to peroneal artery  bypass, left SFA to anterior tibial artery bypass, angiogram. postop day #1 -Hemoglobin down to 7.0, will transfuse 2 units packed RBCs -Pain currently controlled, start PT -DC antibiotics after today's dose    Diabetes mellitus uncontrolled with diabetic foot ulcer, IDDM -Hemoglobin A1c 6.5 -CBGs uncontrolled, increase Lantus to 20 units, NovoLog meal coverage to 4 units 3 times daily AC, continue sliding scale insulin  -Diabetic coordinator consult placed    left popliteal vein DVT -Doppler ultrasound lower extremity on 1/4 showed left popliteal vein DVT  - Xarelto currently on hold, per vascular surgery, restart heparin tomorrow 1/9 with plan for transition back to Xarelto prior to discharge, Darco shoe  Essential hypertension -BP stable, continue Coreg, Cozaar, triamterene/HCTZ -Continue hydralazine IV as needed with parameters  Gout - continue allopurinol, colchicine  Chronic anemia, likely due to PVD -baseline appears to be around 8 -Anemia panel suggestive of anemia of chronic disease -Postop hemoglobin down to 7.0, transfused 2 units packed RBCs  CKD stage III -Baseline creatinine 1.5-1.7, creatinine currently at baseline   Code Status: Full CODE STATUS  DVT Prophylaxis: Xarelto currently on hold Family Communication: Discussed in detail with the patient, all imaging results, lab results explained to the patient, patient's wife, daughter and the son at the bedside    Disposition Plan: When cleared by vascular surgery  Time Spent in minutes   35 minutes  Procedures:  Procedure Performed: 06/30/2018 1.  Ultrasound-guided access of the right common femoral artery 2.  Aortogram 3.  Left lower extremity arteriogram with selection of second order branches 4.  45 minutes of monitored moderate conscious sedation time 5.  Mynx closure of the right common femoral artery  Consultants:   Cardiology Vascular surgery  Antimicrobials:      Medications  Scheduled  Meds: . sodium chloride   Intravenous Once  . sodium chloride   Intravenous Once  . sodium chloride   Intravenous Once  . allopurinol  100 mg Oral Daily  . carvedilol  12.5 mg Oral Q1400  . Chlorhexidine Gluconate Cloth  6 each Topical Q0600  . clopidogrel  75 mg Oral Daily  . colchicine  0.6 mg Oral BID  . docusate sodium  100 mg Oral Daily  . dorzolamide-timolol  1 drop Both Eyes BID  . feeding supplement (GLUCERNA SHAKE)  237 mL Oral BID BM  . feeding supplement (PRO-STAT SUGAR FREE 64)  30 mL Oral Daily  . furosemide  20 mg Intravenous Once  . insulin aspart  0-15 Units Subcutaneous TID WC  . insulin aspart  0-5 Units Subcutaneous QHS  . insulin aspart  4 Units Subcutaneous TID WC  . insulin glargine  20 Units Subcutaneous QHS  . latanoprost  1 drop Both Eyes QHS  . [START ON 07/03/2018] losartan  100 mg Oral Q1400  . pantoprazole  40 mg Oral Daily  . pravastatin  40 mg Oral QHS  . pregabalin  50-100 mg Oral QHS  . sodium chloride flush  3 mL Intravenous Q12H  . triamterene-hydrochlorothiazide  1 tablet Oral Q1400   Continuous Infusions: . cefTRIAXone (ROCEPHIN)  IV Stopped (06/30/18 1649)  . metronidazole 500 mg (07/02/18 0507)   PRN Meds:.acetaminophen **OR** acetaminophen, alum & mag hydroxide-simeth, guaiFENesin-dextromethorphan, hydrALAZINE, labetalol, metoprolol tartrate, morphine injection, ondansetron **OR** ondansetron (ZOFRAN) IV, oxyCODONE-acetaminophen, phenol   Antibiotics   Anti-infectives (From admission, onward)   Start     Dose/Rate Route Frequency Ordered Stop   07/02/18 1000  vancomycin (VANCOCIN) 1,500 mg in sodium chloride 0.9 % 500 mL IVPB     1,500 mg 250 mL/hr over 120 Minutes Intravenous Every 24 hours 07/01/18 2026 07/02/18 1057   07/01/18 0900  vancomycin (VANCOCIN) 1,500 mg in sodium chloride 0.9 % 500 mL IVPB     1,500 mg 250 mL/hr over 120 Minutes Intravenous To ShortStay [facility] 07/01/18 0855 07/01/18 1023   06/29/18 0900  vancomycin  (VANCOCIN) 1,500 mg in sodium chloride 0.9 % 500 mL IVPB  Status:  Discontinued     1,500 mg 250 mL/hr over 120 Minutes Intravenous Every 24 hours 06/28/18 1443 07/01/18 0855   06/28/18 1500  metroNIDAZOLE (FLAGYL) IVPB 500 mg     500 mg 100 mL/hr over 60 Minutes Intravenous Every 8 hours 06/28/18 1443 07/02/18 2359   06/28/18 1500  cefTRIAXone (ROCEPHIN) 2 g in sodium chloride 0.9 % 100 mL IVPB     2 g 200 mL/hr over 30 Minutes Intravenous Every 24 hours 06/28/18 1443 07/02/18 2359   06/28/18 1000  vancomycin (VANCOCIN) 1,500 mg in sodium chloride 0.9 % 500 mL IVPB  Status:  Discontinued  1,500 mg 250 mL/hr over 120 Minutes Intravenous Every 24 hours 06/27/18 1536 06/28/18 1435   06/27/18 1500  cefTRIAXone (ROCEPHIN) 2 g in sodium chloride 0.9 % 100 mL IVPB  Status:  Discontinued     2 g 200 mL/hr over 30 Minutes Intravenous Every 24 hours 06/27/18 1414 06/28/18 1435   06/27/18 1500  metroNIDAZOLE (FLAGYL) IVPB 500 mg  Status:  Discontinued     500 mg 100 mL/hr over 60 Minutes Intravenous Every 8 hours 06/27/18 1414 06/28/18 1435   06/27/18 1015  vancomycin (VANCOCIN) 2,000 mg in sodium chloride 0.9 % 500 mL IVPB     2,000 mg 250 mL/hr over 120 Minutes Intravenous  Once 06/27/18 1013 06/27/18 1247        Subjective:   [name] was seen and examined today.  Doing well, concerned about elevated CBGs.  Son at the bedside.  No acute complaints.  Patient denies dizziness, chest pain, shortness of breath, abdominal pain, N/V/D/C.    Objective:   Vitals:   07/02/18 0558 07/02/18 0845 07/02/18 1025 07/02/18 1115  BP: 105/63 109/60 118/68   Pulse: 77 83 81   Resp: 20 (!) 22 (!) 23   Temp: 98.4 F (36.9 C) 98.5 F (36.9 C) 98 F (36.7 C) 98.3 F (36.8 C)  TempSrc: Oral Oral Oral   SpO2: 96% 98% 100%   Weight:      Height:        Intake/Output Summary (Last 24 hours) at 07/02/2018 1154 Last data filed at 07/02/2018 1030 Gross per 24 hour  Intake 4887.29 ml  Output 1850 ml    Net 3037.29 ml     Wt Readings from Last 3 Encounters:  06/27/18 104.3 kg  05/05/18 114.8 kg  04/22/18 113.4 kg     Physical Exam  General: Alert and oriented x 3, NAD  Eyes:   HEENT:    Cardiovascular: S1 S2 clear, no murmurs, RRR. No pedal edema b/l  Respiratory: CTAB, no wheezing, rales or rhonchi  Gastrointestinal: Soft, nontender, nondistended, NBS  Ext: no pedal edema bilaterally  Neuro: no new deficits  Musculoskeletal: No cyanosis, clubbing  Skin: Left foot dressing intact  Psych: Normal affect and demeanor, alert and oriented x3      Data Reviewed:  I have personally reviewed following labs and imaging studies  Micro Results Recent Results (from the past 240 hour(s))  MRSA PCR Screening     Status: Abnormal   Collection Time: 06/27/18  4:06 PM  Result Value Ref Range Status   MRSA by PCR POSITIVE (A) NEGATIVE Final    Comment:        The GeneXpert MRSA Assay (FDA approved for NASAL specimens only), is one component of a comprehensive MRSA colonization surveillance program. It is not intended to diagnose MRSA infection nor to guide or monitor treatment for MRSA infections. RESULT CALLED TO, READ BACK BY AND VERIFIED WITHConception Chancy RN [phone] 1902 BY GF Performed at [facility], [place] [address], [place], [place] [address]     Radiology Reports Mr Foot Left Wo Contrast  Result Date: 06/29/2018 CLINICAL DATA:  Diabetic patient with a skin ulceration on the third toe of the left foot. Left foot pain, redness and swelling. The patient's little toe appears necrotic. EXAM: MRI OF THE LEFT FOOT WITHOUT CONTRAST TECHNIQUE: Multiplanar, multisequence MR imaging of the left foot was performed. No intravenous contrast was administered. COMPARISON:  Plain films left foot 06/23/2018. FINDINGS: Bones/Joint/Cartilage Markedly decreased T1  and T2 signal throughout the distal phalanx of the little toe are most consistent with osteonecrosis. There is  increased T2 signal throughout the proximal phalanx of the great toe consistent with osteomyelitis. The distal phalanx of the third toe is not well seen but there appears to be bony destructive change of the tuft. Innumerable punctate foci of decreased T1 and T2 signal in soft tissues about the distal phalanx are consistent with the presence of gas. The third toe is otherwise normal in appearance. Mild hallux valgus and mild first MTP osteoarthritis are seen. Imaged bones otherwise appear normal. Ligaments Intact. Muscles and Tendons Marked atrophy of intrinsic musculature the foot is identified. No intramuscular fluid collection. Soft tissues Diffuse subcutaneous edema is present about the dorsum of the foot. IMPRESSION: Markedly decreased T1 and T2 signal throughout the distal phalanx of the little toe likely due to osteonecrosis. Marrow edema in the proximal phalanx of the fifth toe is consistent with osteomyelitis. Bony destructive change in the tuft of the third toe consistent with osteomyelitis. No other evidence of osteomyelitis is identified. Soft tissue gas about the distal phalanx of the third toe consistent with skin ulceration and cellulitis. Negative for abscess or septic joint. Diffuse subcutaneous edema about the foot most consistent with cellulitis. Electronically Signed   By: Inge Rise M.D.   On: 06/29/2018 13:09   Dg Ang/ext/uni/or Left  Result Date: 07/01/2018 CLINICAL DATA:  68 year old male with a history of critical limb ischemia EXAM: LEFT ANG/EXT/UNI/ OR CONTRAST:  Op note FLUOROSCOPY TIME:  Op note COMPARISON:  None. FINDINGS: Limited intraoperative angiogram images of left lower extremity demonstrating partial opacification of the tibial vasculature. IMPRESSION: Limited intraoperative fluoroscopic images of left-sided distal bypass. Please refer to the dictated operative report for full details of intraoperative findings and procedure. Electronically Signed   By: Corrie Mckusick  D.O.   On: 07/01/2018 16:32   Dg Foot Complete Left  Result Date: 06/23/2018 CLINICAL DATA:  Left foot ulcers. EXAM: LEFT FOOT - COMPLETE 3+ VIEW COMPARISON:  None. FINDINGS: Old distal left fifth metatarsal fracture is noted. Large soft tissue ulceration is seen involving distal portion of third toe with underlying lytic destruction of the distal tuft of third distal phalanx consistent with osteomyelitis. Joint spaces are intact. IMPRESSION: Soft tissue ulceration involving distal soft tissues of third toe with underlying lytic destruction of distal tuft of third distal phalanx consistent with osteomyelitis. Electronically Signed   By: Marijo Conception, M.D.   On: 06/23/2018 15:54   Vas [place] Lower Extremity Saphenous Vein Mapping  Result Date: 06/30/2018 LOWER EXTREMITY VEIN MAPPING Indications: PAD  Performing Technologist: Oliver Hum RVT  Examination Guidelines: A complete evaluation includes B-mode imaging, spectral Doppler, color Doppler, and power Doppler as needed of all accessible portions of each vessel. Bilateral testing is considered an integral part of a complete examination. Limited examinations for reoccurring indications may be performed as noted. +---------------+-----------+----------------------+---------------+-----------+   RT Diameter  RT Findings         GSV            LT Diameter  LT Findings      (cm)                                            (cm)                  +---------------+-----------+----------------------+---------------+-----------+  0.41                     Saphenofemoral         0.35                                                   Junction                                  +---------------+-----------+----------------------+---------------+-----------+      0.29                     Proximal thigh         0.56       branching  +---------------+-----------+----------------------+---------------+-----------+      0.28                        Mid thigh            0.47       branching  +---------------+-----------+----------------------+---------------+-----------+      0.23       branching      Distal thigh          0.51       branching  +---------------+-----------+----------------------+---------------+-----------+      0.21                          Knee              0.57       branching  +---------------+-----------+----------------------+---------------+-----------+      0.09                       Prox calf            0.45       branching  +---------------+-----------+----------------------+---------------+-----------+      0.08       branching        Mid calf            0.22                  +---------------+-----------+----------------------+---------------+-----------+      0.16                      Distal calf           0.31       branching  +---------------+-----------+----------------------+---------------+-----------+ Diagnosing physician: Monica Martinez MD Electronically signed by Monica Martinez MD on 06/30/2018 at 4:18:13 PM.    Final    Vas [place] Lower Extremity Arterial Duplex  Result Date: 06/29/2018 LOWER EXTREMITY ARTERIAL DUPLEX STUDY Indications: Unsuccessful revascularization 05/09/18 previous SFA distal and              popliteal stenting with stent thrombosis.  Current ABI: Previous ABI 05/02/18 Right 0.82 Left 0.43 Performing Technologist: June Leap RDMS, RVT  Examination Guidelines: A complete evaluation includes B-mode imaging, spectral Doppler, color Doppler, and power Doppler as needed of all accessible portions of each vessel. Bilateral testing is considered an integral part of a complete examination. Limited examinations for reoccurring indications may be performed as noted.  Left Duplex Findings: +-----------+--------+-----+--------+----------+-------------------------------+  PSV cm/sRatioStenosisWaveform  Comments                         +-----------+--------+-----+--------+----------+-------------------------------+ CFA Prox   89                   biphasic                                  +-----------+--------+-----+--------+----------+-------------------------------+ DFA        62                   biphasic                                  +-----------+--------+-----+--------+----------+-------------------------------+ SFA Prox   101                  biphasic                                  +-----------+--------+-----+--------+----------+-------------------------------+ SFA Mid    74                   biphasic                                  +-----------+--------+-----+--------+----------+-------------------------------+ SFA Distal 52                   biphasic  proximal part of stent is                                                 patent, collateral vessel is                                              noted just above where stent                                              occludes                        +-----------+--------+-----+--------+----------+-------------------------------+ POP Prox                                  occluded stent                  +-----------+--------+-----+--------+----------+-------------------------------+ POP Distal                                occluded stent                  +-----------+--------+-----+--------+----------+-------------------------------+ ATA Prox   44                   monophasic                                +-----------+--------+-----+--------+----------+-------------------------------+  ATA Mid                                   unable to image due to bandages +-----------+--------+-----+--------+----------+-------------------------------+ PTA Mid                         monophasic                                +-----------+--------+-----+--------+----------+-------------------------------+ PTA  Distal 41                   monophasic                                +-----------+--------+-----+--------+----------+-------------------------------+ PERO Mid                        occluded                                  +-----------+--------+-----+--------+----------+-------------------------------+ PERO Distal                     occluded                                  +-----------+--------+-----+--------+----------+-------------------------------+  Summary: Left: Left popliteal stent is occluded. Left peroneal artery is occluded. Details mentioned above. Incidental finding: left popliteal vein DVT.   See table(s) above for measurements and observations. Electronically signed by Ruta Hinds MD on 06/29/2018 at 12:35:35 PM.    Final     Lab Data:  CBC: Recent Labs  Lab 06/27/18 1045 06/28/18 0248 06/29/18 1119 06/30/18 0503 07/01/18 0243 07/01/18 1227 07/01/18 1349 07/01/18 1556 07/02/18 0506  WBC 11.2* 9.7 9.9 9.9 10.6*  --   --   --  9.1  NEUTROABS 9.6*  --   --   --   --   --   --   --   --   HGB 9.2* 7.9* 8.2* 8.2* 8.6* 8.8* 6.8* 8.2* 7.0*  HCT 30.0* 24.7* 26.0* 26.6* 27.3* 26.0* 20.0* 24.0* 20.9*  MCV 76.9* 75.8* 74.7* 74.3* 74.8*  --   --   --  78.9*  PLT 326 297 315 314 345  --   --   --  829   Basic Metabolic Panel: Recent Labs  Lab 06/28/18 0248 06/29/18 1119 06/30/18 0503 07/01/18 0243 07/01/18 1227 07/01/18 1349 07/01/18 1556 07/02/18 0506  NA 134* 132* 132* 136 135 135 136 136  K 4.0 3.9 3.8 3.9 4.5 4.5 4.9 4.5  CL 99 99 100 102  --   --   --  109  CO2 24 23 23 24   --   --   --  21*  GLUCOSE 87 178* 143* 180*  --   --   --  219*  BUN 18 17 17 15   --   --   --  15  CREATININE 1.41* 1.29* 1.31* 1.23  --   --   --  1.23  CALCIUM 9.0 9.0 8.9 8.9  --   --   --  8.2*   GFR: Estimated Creatinine Clearance: 73.9 mL/min (by  C-G formula based on SCr of 1.23 mg/dL). Liver Function Tests: No results for input(s): AST, ALT, ALKPHOS, BILITOT,  PROT, ALBUMIN in the last 168 hours. No results for input(s): LIPASE, AMYLASE in the last 168 hours. No results for input(s): AMMONIA in the last 168 hours. Coagulation Profile: No results for input(s): INR, PROTIME in the last 168 hours. Cardiac Enzymes: No results for input(s): CKTOTAL, CKMB, CKMBINDEX, TROPONINI in the last 168 hours. BNP (last 3 results) No results for input(s): PROBNP in the last 8760 hours. HbA1C: No results for input(s): HGBA1C in the last 72 hours. CBG: Recent Labs  Lab 07/01/18 1730 07/01/18 1843 07/01/18 2118 07/02/18 0603 07/02/18 1127  GLUCAP 216* 234* 323* 208* 219*   Lipid Profile: No results for input(s): CHOL, HDL, LDLCALC, TRIG, CHOLHDL, LDLDIRECT in the last 72 hours. Thyroid Function Tests: No results for input(s): TSH, T4TOTAL, FREET4, T3FREE, THYROIDAB in the last 72 hours. Anemia Panel: No results for input(s): VITAMINB12, FOLATE, FERRITIN, TIBC, IRON, RETICCTPCT in the last 72 hours. Urine analysis:    Component Value Date/Time   COLORURINE YELLOW 04/29/2018 0820   APPEARANCEUR CLEAR 04/29/2018 0820   LABSPEC 1.013 04/29/2018 0820   PHURINE 5.0 04/29/2018 0820   GLUCOSEU 50 (A) 04/29/2018 0820   HGBUR NEGATIVE 04/29/2018 0820   BILIRUBINUR NEGATIVE 04/29/2018 0820   KETONESUR NEGATIVE 04/29/2018 0820   PROTEINUR NEGATIVE 04/29/2018 0820   UROBILINOGEN 0.2 12/01/2012 2135   NITRITE NEGATIVE 04/29/2018 0820   LEUKOCYTESUR NEGATIVE 04/29/2018 0820       M.D. [facility] 07/02/2018, 11:54 AM  Pager: [phone] Between 7am to 7pm - call Pager - [phone]  After 7pm go to [email or web address] - password [email or web address]  Call night coverage person covering after 7pm

## 2018-07-02 NOTE — Progress Notes (Signed)
Patient places self on CPAP. 

## 2018-07-02 NOTE — Progress Notes (Signed)
Attempted to bring the patient to the vascular lab x2 for ABI's. Will attempt tomorrow, 07/03/2018.  07/02/18 4:43 PM [name] RVT

## 2018-07-03 ENCOUNTER — Inpatient Hospital Stay (HOSPITAL_COMMUNITY): Payer: BC Managed Care – PPO

## 2018-07-03 DIAGNOSIS — Z95820 Peripheral vascular angioplasty status with implants and grafts: Secondary | ICD-10-CM

## 2018-07-03 LAB — TYPE AND SCREEN
ABO/RH(D): O POS
Antibody Screen: NEGATIVE
Unit division: 0
Unit division: 0
Unit division: 0
Unit division: 0

## 2018-07-03 LAB — BASIC METABOLIC PANEL
Anion gap: 8 (ref 5–15)
BUN: 20 mg/dL (ref 8–23)
CO2: 21 mmol/L — ABNORMAL LOW (ref 22–32)
Calcium: 7.9 mg/dL — ABNORMAL LOW (ref 8.9–10.3)
Chloride: 108 mmol/L (ref 98–111)
Creatinine, Ser: 1.5 mg/dL — ABNORMAL HIGH (ref 0.61–1.24)
GFR calc Af Amer: 55 mL/min — ABNORMAL LOW (ref >60)
GFR calc non Af Amer: 47 mL/min — ABNORMAL LOW (ref >60)
Glucose, Bld: 152 mg/dL — ABNORMAL HIGH (ref 70–99)
Potassium: 4.1 mmol/L (ref 3.5–5.1)
Sodium: 137 mmol/L (ref 135–145)

## 2018-07-03 LAB — APTT: aPTT: 31 seconds (ref 24–36)

## 2018-07-03 LAB — BPAM RBC
Blood Product Expiration Date: 202001312359
Blood Product Expiration Date: 202001312359
Blood Product Expiration Date: 202002042359
Blood Product Expiration Date: 202002042359
ISSUE DATE / TIME: 202001071336
ISSUE DATE / TIME: 202001071336
ISSUE DATE / TIME: 202001081021
ISSUE DATE / TIME: 202001081444
Unit Type and Rh: 5100
Unit Type and Rh: 5100
Unit Type and Rh: 5100
Unit Type and Rh: 5100

## 2018-07-03 LAB — GLUCOSE, CAPILLARY
Glucose-Capillary: 141 mg/dL — ABNORMAL HIGH (ref 70–99)
Glucose-Capillary: 197 mg/dL — ABNORMAL HIGH (ref 70–99)
Glucose-Capillary: 222 mg/dL — ABNORMAL HIGH (ref 70–99)
Glucose-Capillary: 156 mg/dL — ABNORMAL HIGH (ref 70–99)

## 2018-07-03 LAB — CBC
HCT: 25.4 % — ABNORMAL LOW (ref 39.0–52.0)
Hemoglobin: 8.3 g/dL — ABNORMAL LOW (ref 13.0–17.0)
MCH: 26.3 pg (ref 26.0–34.0)
MCHC: 32.7 g/dL (ref 30.0–36.0)
MCV: 80.6 fL (ref 80.0–100.0)
Platelets: 212 10*3/uL (ref 150–400)
RBC: 3.15 MIL/uL — ABNORMAL LOW (ref 4.22–5.81)
RDW: 18.3 % — ABNORMAL HIGH (ref 11.5–15.5)
WBC: 11.4 10*3/uL — ABNORMAL HIGH (ref 4.0–10.5)
nRBC: 0 % (ref 0.0–0.2)

## 2018-07-03 LAB — HEPARIN LEVEL (UNFRACTIONATED): Heparin Unfractionated: 0.55 IU/mL (ref 0.30–0.70)

## 2018-07-03 MED ORDER — RIVAROXABAN 15 MG PO TABS
15.0000 mg | ORAL_TABLET | Freq: Two times a day (BID) | ORAL | Status: DC
  Administered 2018-07-03 – 2018-07-05 (×4): 15 mg via ORAL
  Filled 2018-07-03 (×5): qty 1

## 2018-07-03 MED ORDER — HEPARIN (PORCINE) 25000 UT/250ML-% IV SOLN
1750.0000 [IU]/h | INTRAVENOUS | Status: DC
  Administered 2018-07-03: 1750 [IU]/h via INTRAVENOUS
  Filled 2018-07-03: qty 250

## 2018-07-03 MED ORDER — RIVAROXABAN 15 MG PO TABS: 15.0000 mg | ORAL_TABLET | Freq: Every day | ORAL | Status: DC

## 2018-07-03 NOTE — Progress Notes (Signed)
ABI's have been completed. Preliminary results can be found in CV Proc through chart review.   07/03/18 5:16 PM [name] RVT

## 2018-07-03 NOTE — Evaluation (Signed)
Physical Therapy Evaluation Patient Details Name: [name] MRN: [id number] DOB: 1950-08-14 Today's Date: 07/03/2018   History of Present Illness  Pt is a 68 y.o. M with significant PMH of gout, HTN, IDDM, CKD III, PVD s/p recent LLE stenting, who presents with left foot pain, redness and swelling. 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  Clinical Impression  Pt admitted with above diagnosis. Pt currently with functional limitations due to the deficits listed below (see PT Problem List). Prior to admission, pt with recent discharge from SNF to home and was ambulating limited distances with no assistive device. On PT evaluation, patient presents with decreased functional mobility secondary to pain, impaired sensation, balance deficits, and decreased safety awareness. Pt essentially treating LLE as nonweightbearing during mobility despite extensive education on use of Darco shoe. Performing transfers with up to mod assist and hopping 5 feet with walker and min assist. Pt needs continued transfer/gait training and education for maintenance of precautions. Pt will benefit from skilled PT to increase their independence and safety with mobility to allow discharge to the venue listed below.       Follow Up Recommendations SNF;Supervision for mobility/OOB    Equipment Recommendations  Wheelchair (measurements PT)    Recommendations for Other Services       Precautions / Restrictions Precautions Precautions: Fall Precaution Comments: wound vac Required Braces or Orthoses: Other Brace Other Brace: darco shoe Restrictions Weight Bearing Restrictions: Yes LLE Weight Bearing: Partial weight bearing LLE Partial Weight Bearing Percentage or Pounds: (Heel weightbearing)      Mobility  Bed Mobility Overal bed mobility: Needs Assistance Bed Mobility: Supine to Sit     Supine to sit: Supervision         Transfers Overall transfer level: Needs assistance Equipment used: Rolling walker (2 wheeled) Transfers: Sit to/from Stand Sit to Stand: Min assist;Mod assist         General transfer comment: Min assist to boost up from elevated bed surface. Mod assist to lower down from stand to sit due to decreased eccentric control  Ambulation/Gait Ambulation/Gait assistance: Min assist Gait Distance (Feet): 5 Feet Assistive device: Rolling walker (2 wheeled) Gait Pattern/deviations: Trunk flexed     General Gait Details: Pt hopping with min assist despite cueing for sequencing and heel weightbearing through Darco shoe  Stairs            Wheelchair Mobility    Modified Rankin (Stroke Patients Only)       Balance Overall balance assessment: Needs assistance   Sitting balance-Leahy Scale: Good     Standing balance support: Bilateral upper extremity supported Standing balance-Leahy Scale: Poor                               Pertinent Vitals/Pain Pain Assessment: Faces Faces Pain Scale: Hurts whole lot Pain Location: groin Pain Descriptors / Indicators: Burning Pain Intervention(s): Monitored during session    Home Living Family/patient expects to be discharged to:: Private residence Living Arrangements: Spouse/significant other Available Help at Discharge: Family Type of Home: House(able to live in basement) Home Access: Level entry;Stairs to enter   Entrance Stairs-Number of Steps: stairs at main entrance but uses level entry in basement  Home Layout: Two level Home Equipment: Walker - 2 wheels      Prior Function Level of Independence: Independent  Comments: After discharge from SNF, states he was ambulating without an assistive device and driving     Hand Dominance        Extremity/Trunk Assessment   Upper Extremity Assessment Upper Extremity Assessment: Defer to OT evaluation    Lower Extremity Assessment Lower Extremity  Assessment: LLE deficits/detail;RLE deficits/detail RLE Deficits / Details: generalized weakness LLE Deficits / Details: Grossly 2/5, resting in external rotation with increased edema LLE Sensation: decreased light touch       Communication   Communication: No difficulties  Cognition Arousal/Alertness: Awake/alert Behavior During Therapy: WFL for tasks assessed/performed Overall Cognitive Status: Within Functional Limits for tasks assessed                                 General Comments: Pt self distracts and is tangential, requires redirection to task       General Comments      Exercises     Assessment/Plan    PT Assessment Patient needs continued PT services  PT Problem List Decreased strength;Decreased activity tolerance;Decreased balance;Decreased mobility;Pain;Impaired sensation;Decreased knowledge of precautions       PT Treatment Interventions DME instruction;Gait training;Stair training;Functional mobility training;Therapeutic activities;Therapeutic exercise;Balance training;Patient/family education    PT Goals (Current goals can be found in the Care Plan section)  Acute Rehab PT Goals Patient Stated Goal: "Move this left leg." PT Goal Formulation: With patient Time For Goal Achievement: 07/17/18 Potential to Achieve Goals: Good    Frequency Min 3X/week   Barriers to discharge        Co-evaluation PT/OT/SLP Co-Evaluation/Treatment: Yes Reason for Co-Treatment: For patient/therapist safety;To address functional/ADL transfers           AM-PAC PT "6 Clicks" Mobility  Outcome Measure Help needed turning from your back to your side while in a flat bed without using bedrails?: None Help needed moving from lying on your back to sitting on the side of a flat bed without using bedrails?: None Help needed moving to and from a bed to a chair (including a wheelchair)?: A Little Help needed standing up from a chair using your arms (e.g., wheelchair  or bedside chair)?: A Lot Help needed to walk in hospital room?: A Lot Help needed climbing 3-5 steps with a railing? : Total 6 Click Score: 16    End of Session Equipment Utilized During Treatment: Gait belt;Other (comment)(darco shoe) Activity Tolerance: Patient limited by pain Patient left: in chair;with call bell/phone within reach;with family/visitor present Nurse Communication: Mobility status PT Visit Diagnosis: Unsteadiness on feet (R26.81);Other abnormalities of gait and mobility (R26.89);Pain Pain - Right/Left: Left Pain - part of body: Leg    Time: 3244-0102 PT Time Calculation (min) (ACUTE ONLY): 30 min   Charges:   PT Evaluation $PT Eval Moderate Complexity: 1 Mod         Ellamae Sia, [place], DPT Acute Rehabilitation Services Pager [phone] Office [phone]  Willy Eddy 07/03/2018, 11:09 AM

## 2018-07-03 NOTE — Progress Notes (Addendum)
ANTICOAGULATION CONSULT NOTE - Follow Up Consult  Pharmacy Consult for Heparin Indication: DVT treatment  No Known Allergies  Vital Signs: Temp: 98.5 F (36.9 C) (01/09 0434) Temp Source: Oral (01/09 0434) BP: 123/77 (01/09 0434) Pulse Rate: 72 (01/08 2347)  Labs: Recent Labs    06/30/18 5400 07/01/18 0243  07/02/18 0506 07/02/18 1935 07/03/18 0311  HGB  --  8.6*   < > 7.0* 8.4* 8.3*  HCT  --  27.3*   < > 20.9* 26.1* 25.4*  PLT  --  345  --  248  --  212  APTT 53* 63*  --  34  --  31  HEPARINUNFRC 0.38  --   --   --   --   --   CREATININE  --  1.23  --  1.23  --  1.50*   < > = values in this interval not displayed.    Estimated Creatinine Clearance: 60.6 mL/min (A) (by C-G formula based on SCr of 1.5 mg/dL (H)).  Assessment: 68 year old malewith popliteal vein DVT s/p toe amputation 1/7 and peroneal artery thrombectomy and SFA to peroneal artery bypass. Pharmacy to restart heparin (previous rate was 1750 units/hr) and plans for oral anticoagulation closer to discharge.  -hg= 8.3 (s/p PRBC)   Goal of Therapy:  Monitor platelets by anticoagulation protocol: Yes  Heparin level = 0.3 to 0.7 units/mL PTT = 66 to 102 seconds   Plan:  -Restart heparin at 1750 units/hr -Heparin level in 6 hours and daily wth CBC daily  Hildred Laser, PharmD Clinical Pharmacist **Pharmacist phone directory can now be found on [email or web address] ([email or web address]).  Listed under [place].  Addendum -pharmacy to transition to xarelto (treatment dose)  Plan -Xarelto 15mg  po bid start tonight and d/c heparin -After 21 days change to 20mg  po bid  Hildred Laser, PharmD Clinical Pharmacist **Pharmacist phone directory can now be found on [email or web address] ([email or web address]).  Listed under [place].

## 2018-07-03 NOTE — Consult Note (Addendum)
[facility] Nurse wound consult note Reason for Consult:NPWT (VAC) placement to left second through fifth toe amputation sites Wound type: surgical  Pressure Injury POA: NA Measurement: 6 cm x 6 cm x 3 cm  Wound OMQ:TTCNG red Drainage (amount, consistency, odor) moderate serosanguinous  Friable periwound, bleeds with cleansing.  Periwound is protected with barrier ring to optimize fit.  Patient will be wearing an orthotic shoe so I placed the Trac pad to the dorsal foot to offload pressure.  Periwound:intact Dressing procedure/placement/frequency:Cleanse wound to left toe amputation site with NS.  Apply barrier ring to periwound.  Drape to dorsal foot for bridge.  Fill wound with foam and bridge to dorsal foot.  Cover with drape.  Seal is immediately achieved at 125.  Change three times weekly. Spoke with Dr. Donzetta Matters who is in agreement with next [facility] change being Monday 07/07/2018 and Monday/Wednesday/Friday after that.  [facility] team will follow.   Domenic Moras MSN, RN, FNP-BC CWON Wound, Ostomy, Continence Nurse Pager [phone]

## 2018-07-03 NOTE — Progress Notes (Signed)
Orthopedic Tech Progress Note Patient Details:  [name] 08/05/1950 [id number]  Ortho Devices Type of Ortho Device: Darco shoe Ortho Device/Splint Location: lle Ortho Device/Splint Interventions: Ordered, Application, Adjustment   Post Interventions Patient Tolerated: Well Instructions Provided: Care of device, Adjustment of device   Karolee Stamps 07/03/2018, 7:12 AM

## 2018-07-03 NOTE — Evaluation (Signed)
Occupational Therapy Evaluation Patient Details Name: [name] MRN: [id number] DOB: 08-11-1950 Today's Date: 07/03/2018    History of Present Illness Pt is a 68 y.o. M with significant PMH of gout, HTN, IDDM, CKD III, PVD s/p recent LLE stenting, who presents with left foot pain, redness and swelling. 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   Clinical Impression   Pt with decline in function and safety with ADLs and ADL mobility with decreased balance, strength and endurance. Pt would benefit from acute OT services to address impairments to maximize level of function and safety    Follow Up Recommendations  SNF    Equipment Recommendations  None recommended by OT    Recommendations for Other Services       Precautions / Restrictions Precautions Precautions: Fall Precaution Comments: wound vac Required Braces or Orthoses: Other Brace Other Brace: darco shoe Restrictions Weight Bearing Restrictions: Yes LLE Weight Bearing: Partial weight bearing LLE Partial Weight Bearing Percentage or Pounds: (Heel weightbearing)      Mobility Bed Mobility Overal bed mobility: Needs Assistance Bed Mobility: Supine to Sit     Supine to sit: Supervision        Transfers Overall transfer level: Needs assistance Equipment used: Rolling walker (2 wheeled) Transfers: Sit to/from Stand Sit to Stand: Min assist;Mod assist         General transfer comment: Min assist to boost up from elevated bed surface. Mod assist to lower down from stand to sit due to decreased eccentric control    Balance Overall balance assessment: Needs assistance   Sitting balance-Leahy Scale: Good     Standing balance support: Bilateral upper extremity supported Standing balance-Leahy Scale: Poor                             ADL either performed or assessed with clinical judgement   ADL Overall  ADL's : Needs assistance/impaired Eating/Feeding: Independent;Sitting   Grooming: Wash/dry hands;Wash/dry face;Sitting;Supervision/safety;Set up   Upper Body Bathing: Set up;Supervision/ safety;Sitting   Lower Body Bathing: Moderate assistance   Upper Body Dressing : Set up;Supervision/safety;Sitting   Lower Body Dressing: Maximal assistance   Toilet Transfer: Minimal assistance;Moderate assistance;Ambulation;RW;Cueing for safety;Cueing for sequencing Toilet Transfer Details (indicate cue type and reason): simulated bed to recliner Toileting- Clothing Manipulation and Hygiene: Moderate assistance;Sit to/from stand       Functional mobility during ADLs: Minimal assistance;Moderate assistance;Cueing for safety;Rolling walker       Vision Baseline Vision/History: Wears glasses Wears Glasses: At all times Patient Visual Report: No change from baseline       Perception     Praxis      Pertinent Vitals/Pain Pain Assessment: 0-10 Pain Score: 8  Faces Pain Scale: Hurts whole lot Pain Location: groin Pain Descriptors / Indicators: Burning;Tightness Pain Intervention(s): Limited activity within patient's tolerance;Monitored during session;Repositioned     Hand Dominance Right   Extremity/Trunk Assessment Upper Extremity Assessment Upper Extremity Assessment: Generalized weakness   Lower Extremity Assessment Lower Extremity Assessment: Defer to PT evaluation RLE Deficits / Details: generalized weakness LLE Deficits / Details: Grossly 2/5, resting in external rotation with increased edema LLE Sensation: decreased light touch       Communication Communication Communication: No difficulties   Cognition Arousal/Alertness: Awake/alert Behavior During Therapy: WFL for tasks assessed/performed Overall Cognitive Status: Within Functional Limits for tasks assessed  General Comments: Pt self distracts and is tangential, requires  redirection to task    General Comments       Exercises     Shoulder Instructions      Home Living Family/patient expects to be discharged to:: Private residence Living Arrangements: Spouse/significant other Available Help at Discharge: Family Type of Home: House Home Access: Level entry;Stairs to enter Entrance Stairs-Number of Steps: stairs at main entrance but uses level entry in basement    Home Layout: Two level Alternate Level Stairs-Number of Steps: lives in basement    Bathroom Shower/Tub: Teacher, early years/pre: [place]: Environmental consultant - 2 wheels          Prior Functioning/Environment Level of Independence: Independent        Comments: After discharge from SNF, states he was ambulating without an assistive device and driving        OT Problem List: Decreased strength;Decreased activity tolerance;Decreased knowledge of use of DME or AE;Increased edema;Impaired balance (sitting and/or standing);Decreased safety awareness;Pain      OT Treatment/Interventions: Self-care/ADL training;Therapeutic exercise;DME and/or AE instruction;Therapeutic activities;Patient/family education    OT Goals(Current goals can be found in the care plan section) Acute Rehab OT Goals Patient Stated Goal: "Move this left leg." OT Goal Formulation: With patient/family Time For Goal Achievement: 07/17/18 Potential to Achieve Goals: Good ADL Goals Pt Will Perform Grooming: standing;with min guard assist Pt Will Perform Lower Body Bathing: with min assist;sitting/lateral leans;sit to/from stand Pt Will Perform Lower Body Dressing: with min assist;sitting/lateral leans;sit to/from stand Pt Will Transfer to Toilet: with min assist;with min guard assist;ambulating;regular height toilet;bedside commode;grab bars Pt Will Perform Toileting - Clothing Manipulation and hygiene: with min assist;sit to/from stand  OT Frequency: Min 2X/week   Barriers to D/C: Decreased  caregiver support          Co-evaluation PT/OT/SLP Co-Evaluation/Treatment: Yes Reason for Co-Treatment: To address functional/ADL transfers   OT goals addressed during session: ADL's and self-care;Proper use of Adaptive equipment and DME      AM-PAC OT "6 Clicks" Daily Activity     Outcome Measure Help from another person eating meals?: None Help from another person taking care of personal grooming?: A Little Help from another person toileting, which includes using toliet, bedpan, or urinal?: A Lot Help from another person bathing (including washing, rinsing, drying)?: A Lot Help from another person to put on and taking off regular upper body clothing?: A Little Help from another person to put on and taking off regular lower body clothing?: A Lot 6 Click Score: 16   End of Session Equipment Utilized During Treatment: Gait belt;Rolling walker  Activity Tolerance: Patient tolerated treatment well Patient left: in chair;with call bell/phone within reach;with family/visitor present  OT Visit Diagnosis: Unsteadiness on feet (R26.81);Other abnormalities of gait and mobility (R26.89);Muscle weakness (generalized) (M62.81);Pain Pain - Right/Left: Left Pain - part of body: Leg                Time: 1829-9371 OT Time Calculation (min): 33 min Charges:  OT General Charges $OT Visit: 1 Visit OT Evaluation $OT Eval Moderate Complexity: 1 Mod    Britt Bottom 07/03/2018, 1:33 PM

## 2018-07-03 NOTE — Progress Notes (Addendum)
Nutrition Follow Up  DOCUMENTATION CODES:   Obesity unspecified  INTERVENTION:    Glucerna Shake po TID, each supplement provides 220 kcal and 10 grams of protein  Prostat liquid protein po 30 ml daily, each supplement provides 100 kcal, 15 grams protein  NUTRITION DIAGNOSIS:   Increased nutrient needs related to wound healing as evidenced by estimated needs, ongoing  GOAL:   Patient will meet greater than or equal to 90% of their needs, met  MONITOR:   PO intake, Supplement acceptance, Labs, Weight trends, I & O's, Skin  ASSESSMENT:   68 y.o. M with hx gout, HTN, IDDM on insulin pump with retinopathy, CKD III baseline 1.5-1.7, PVD s/p recent LLE stenting c/b post-stent thrombosis, and OSA on CPAP who presents with left foot pain, redness and swelling. 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 Spoke with Jerene Pitch, Therapist, sports; pt is drinking about 2 Glucerna Shakes per day. He is also taking his Prostat liquid protein daily in ice water.  Wound VAC placed to left second through fifth toe amputation sites. Labs & medications reviewed. CO2 21 (L). CBG's [phone].  Diet Order:   Diet Order            Diet Carb Modified Fluid consistency: Thin; Room service appropriate? Yes  Diet effective 0500             EDUCATION NEEDS:   Not appropriate for education at this time  Skin:  Skin Assessment: Skin Integrity Issues: Skin Integrity Issues:: Other (Comment) Other: Gangrene L 3rd and 5th toe  Last BM:  1/6  Height:   Ht Readings from Last 1 Encounters:  06/27/18 6' 1"  (1.854 m)   Weight:   Wt Readings from Last 1 Encounters:  06/27/18 104.3 kg   Ideal Body Weight:  83.6 kg  BMI:  Body mass index is 30.34 kg/m.  Estimated Nutritional Needs:   Kcal:  2100-2300  Protein:  110-130 gm  Fluid:  2.1-2.3 L  Arthur Holms, RD, LDN Pager #: [phone] After-Hours Pager #: [phone]

## 2018-07-03 NOTE — Progress Notes (Signed)
PROGRESS NOTE    [name]  CNO:[id number] DOB: [birth year] DOA: 06/27/2018 PCP: [name], MD   Brief Narrative:  HPI on 06/27/2018 by Dr. Myrene Buddy [name] is a 68 y.o. 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  The patient was treated for restenosis of his left SFA in Oct complicated by in stent thrombosis and requiring 2 repeat angiograms for stenting. 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  Since then, he seemed to be improving, was ambulating but not back to work until about one week prior to admission when he started to have new pain, redness and swelling of his left foot.   Two days ago he was seen in wound care, had a new ulcer on the left THIRD digit, was started on Levaquin which he has taken.  Then today, this third digit was completely purple, so he was sent from wound care center for IV antibiotics.  He has had no fever, chills, vomiting, LOC, confusion.    Interim history Patient admitted with gangrene of the left toes.  Vascular surgery consulted and appreciated.  Status post angiogram as well as amputation of the left toes #2 through 5, left SFA to peroneal artery bypass, left SFA to anterior tibial artery bypass. 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 -Patient with critical left lower extremity ischemia with necrotic toes on admission -MRI of the left foot showed osteonecrosis of the distal phalanx of the little toe, marrow edema in the proximal phalanx of the fifth toe consistent with osteomyelitis, bony destructive changes of the tuft of the third toe consistent with osteomyelitis, soft tissue gas about the distal phalanx of the third toe consistent with cellulitis -Vascular surgery consulted and appreciated -S/post  angiogram on 06/30/2018, amputation of left toes 2 through 5, left SFA to peroneal artery bypass, left SFA to anterior tibial artery bypass, angiogram -Vancomycin and Zosyn discontinued -Was transfused 2 units PRBC -PT/OT recommending SNF with wheelchair -Social work consulted  Diabetes mellitus, type II -Hemoglobin A1c 6.5 -Continue Lantus, insulin sliding scale and CBG monitoring -Diabetes coordinator consulted  Left popliteal vein DVT -Lower extremity Doppler on 06/28/2018 showed left popliteal vein DVT -Xarelto currently held per vascular surgery -Plan to transition back to Xarelto from heparin  Essential hypertension -Continue Coreg, Cozaar, triamterene/HCTZ, IV hydralazine PRN  Gout -Stable, continue allopurinol, colchicine  Chronic anemia -Baseline hemoglobin approximately 8, dropped to 7 -Patient was given 2 units PRBC, currently hemoglobin 8.3 -Continue to monitor CBC  CKD, stage III -Creatinine appears to be at baseline (1.5-1.7) -Continue to monitor BMP  DVT Prophylaxis Heparin  Code Status: Full  Family Communication: None at bedside  Disposition Plan: Admitted.  Consultants Vascular surgery  Procedures  Procedure Performed: 06/30/2018 1.Ultrasound-guided access of the right common femoral artery 2.Aortogram 3.Left lower extremity arteriogram with selection of second order branches 4.45 minutesofmonitored moderate conscious sedation time 5.Mynx closure of the right common femoral artery  Antibiotics   Anti-infectives (From admission, onward)   Start     Dose/Rate Route Frequency Ordered Stop   07/02/18 1000  vancomycin (VANCOCIN) 1,500 mg in sodium chloride 0.9 % 500 mL IVPB     1,500 mg 250 mL/hr over 120 Minutes Intravenous Every 24 hours 07/01/18 2026 07/02/18 1057  07/01/18 0900  vancomycin (VANCOCIN) 1,500 mg in sodium chloride 0.9 % 500 mL IVPB     1,500 mg 250 mL/hr over 120 Minutes Intravenous To [facility] 07/01/18 0855  07/01/18 1023   06/29/18 0900  vancomycin (VANCOCIN) 1,500 mg in sodium chloride 0.9 % 500 mL IVPB  Status:  Discontinued     1,500 mg 250 mL/hr over 120 Minutes Intravenous Every 24 hours 06/28/18 1443 07/01/18 0855   06/28/18 1500  metroNIDAZOLE (FLAGYL) IVPB 500 mg     500 mg 100 mL/hr over 60 Minutes Intravenous Every 8 hours 06/28/18 1443 07/02/18 2359   06/28/18 1500  cefTRIAXone (ROCEPHIN) 2 g in sodium chloride 0.9 % 100 mL IVPB     2 g 200 mL/hr over 30 Minutes Intravenous Every 24 hours 06/28/18 1443 07/02/18 2359   06/28/18 1000  vancomycin (VANCOCIN) 1,500 mg in sodium chloride 0.9 % 500 mL IVPB  Status:  Discontinued     1,500 mg 250 mL/hr over 120 Minutes Intravenous Every 24 hours 06/27/18 1536 06/28/18 1435   06/27/18 1500  cefTRIAXone (ROCEPHIN) 2 g in sodium chloride 0.9 % 100 mL IVPB  Status:  Discontinued     2 g 200 mL/hr over 30 Minutes Intravenous Every 24 hours 06/27/18 1414 06/28/18 1435   06/27/18 1500  metroNIDAZOLE (FLAGYL) IVPB 500 mg  Status:  Discontinued     500 mg 100 mL/hr over 60 Minutes Intravenous Every 8 hours 06/27/18 1414 06/28/18 1435   06/27/18 1015  vancomycin (VANCOCIN) 2,000 mg in sodium chloride 0.9 % 500 mL IVPB     2,000 mg 250 mL/hr over 120 Minutes Intravenous  Once 06/27/18 1013 06/27/18 1247      Subjective:   [name] seen and examined today.  Patient states he has more movement with his toes and feels his leg is warmer.  Denies current chest pain, shortness breath, abdominal pain, nausea or vomiting, diarrhea constipation, dizziness or headache. Objective:   Vitals:   07/02/18 2347 07/03/18 0434 07/03/18 0847 07/03/18 1130  BP: 119/72 123/77 110/70 126/69  Pulse: 72  78   Resp: 18 (!) 21 17   Temp: 98.7 F (37.1 C) 98.5 F (36.9 C) 98.3 F (36.8 C) 98.2 F (36.8 C)  TempSrc: Oral Oral Oral Oral  SpO2: 96% 97% 96% 98%  Weight:      Height:        Intake/Output Summary (Last 24 hours) at 07/03/2018 1417 Last data  filed at 07/03/2018 1300 Gross per 24 hour  Intake 770 ml  Output 2705 ml  Net -1935 ml   Filed Weights   06/27/18 0857  Weight: 104.3 kg    Exam  General: Well developed, well nourished, NAD, appears stated age  HEENT: NCAT, mucous membranes moist.   Neck: Supple  Cardiovascular: S1 S2 auscultated, RRR, no murmur  Respiratory: Clear to auscultation bilaterally with equal chest rise  Abdomen: Soft, nontender, nondistended, + bowel sounds  Extremities: warm dry without cyanosis clubbing. L foot dressing in place  Neuro: AAOx3, nonfocal  Psych: Normal affect and demeanor pleasant   Data Reviewed: I have personally reviewed following labs and imaging studies  CBC: Recent Labs  Lab 06/27/18 1045  06/29/18 1119 06/30/18 0503 07/01/18 0243  07/01/18 1349 07/01/18 1556 07/02/18 0506 07/02/18 1935 07/03/18 0311  WBC 11.2*   < > 9.9 9.9 10.6*  --   --   --  9.1  --  11.4*  NEUTROABS 9.6*  --   --   --   --   --   --   --   --   --   --  HGB 9.2*   < > 8.2* 8.2* 8.6*   < > 6.8* 8.2* 7.0* 8.4* 8.3*  HCT 30.0*   < > 26.0* 26.6* 27.3*   < > 20.0* 24.0* 20.9* 26.1* 25.4*  MCV 76.9*   < > 74.7* 74.3* 74.8*  --   --   --  78.9*  --  80.6  PLT 326   < > 315 314 345  --   --   --  248  --  212   < > = values in this interval not displayed.   Basic Metabolic Panel: Recent Labs  Lab 06/29/18 1119 06/30/18 0503 07/01/18 0243 07/01/18 1227 07/01/18 1349 07/01/18 1556 07/02/18 0506 07/03/18 0311  NA 132* 132* 136 135 135 136 136 137  K 3.9 3.8 3.9 4.5 4.5 4.9 4.5 4.1  CL 99 100 102  --   --   --  109 108  CO2 23 23 24   --   --   --  21* 21*  GLUCOSE 178* 143* 180*  --   --   --  219* 152*  BUN 17 17 15   --   --   --  15 20  CREATININE 1.29* 1.31* 1.23  --   --   --  1.23 1.50*  CALCIUM 9.0 8.9 8.9  --   --   --  8.2* 7.9*   GFR: Estimated Creatinine Clearance: 60.6 mL/min (A) (by C-G formula based on SCr of 1.5 mg/dL (H)). Liver Function Tests: No results for  input(s): AST, ALT, ALKPHOS, BILITOT, PROT, ALBUMIN in the last 168 hours. No results for input(s): LIPASE, AMYLASE in the last 168 hours. No results for input(s): AMMONIA in the last 168 hours. Coagulation Profile: No results for input(s): INR, PROTIME in the last 168 hours. Cardiac Enzymes: No results for input(s): CKTOTAL, CKMB, CKMBINDEX, TROPONINI in the last 168 hours. BNP (last 3 results) No results for input(s): PROBNP in the last 8760 hours. HbA1C: No results for input(s): HGBA1C in the last 72 hours. CBG: Recent Labs  Lab 07/02/18 1127 07/02/18 1641 07/02/18 2104 07/03/18 0614 07/03/18 1108  GLUCAP 219* 212* 164* 141* 197*   Lipid Profile: No results for input(s): CHOL, HDL, LDLCALC, TRIG, CHOLHDL, LDLDIRECT in the last 72 hours. Thyroid Function Tests: No results for input(s): TSH, T4TOTAL, FREET4, T3FREE, THYROIDAB in the last 72 hours. Anemia Panel: No results for input(s): VITAMINB12, FOLATE, FERRITIN, TIBC, IRON, RETICCTPCT in the last 72 hours. Urine analysis:    Component Value Date/Time   COLORURINE YELLOW 04/29/2018 0820   APPEARANCEUR CLEAR 04/29/2018 0820   LABSPEC 1.013 04/29/2018 0820   PHURINE 5.0 04/29/2018 0820   GLUCOSEU 50 (A) 04/29/2018 0820   HGBUR NEGATIVE 04/29/2018 0820   BILIRUBINUR NEGATIVE 04/29/2018 0820   KETONESUR NEGATIVE 04/29/2018 0820   PROTEINUR NEGATIVE 04/29/2018 0820   UROBILINOGEN 0.2 12/01/2012 2135   NITRITE NEGATIVE 04/29/2018 0820   LEUKOCYTESUR NEGATIVE 04/29/2018 0820   Sepsis Labs: @LABRCNTIP (procalcitonin:4,lacticidven:4)  ) Recent Results (from the past 240 hour(s))  MRSA PCR Screening     Status: Abnormal   Collection Time: 06/27/18  4:06 PM  Result Value Ref Range Status   MRSA by PCR POSITIVE (A) NEGATIVE Final    Comment:        The GeneXpert MRSA Assay (FDA approved for NASAL specimens only), is one component of a comprehensive MRSA colonization surveillance program. It is not intended to diagnose  MRSA infection nor to guide or monitor treatment for MRSA  infections. RESULT CALLED TO, READ BACK BY AND VERIFIED WITHConception Chancy RN [phone] 1902 BY GF Performed at [facility], [place] [address], [place], [place] [address]       Radiology Studies: Dg Ang/ext/uni/or Left  Result Date: 07/01/2018 CLINICAL DATA:  68 year old male with a history of critical limb ischemia EXAM: LEFT ANG/EXT/UNI/ OR CONTRAST:  Op note FLUOROSCOPY TIME:  Op note COMPARISON:  None. FINDINGS: Limited intraoperative angiogram images of left lower extremity demonstrating partial opacification of the tibial vasculature. IMPRESSION: Limited intraoperative fluoroscopic images of left-sided distal bypass. Please refer to the dictated operative report for full details of intraoperative findings and procedure. Electronically Signed   By: Corrie Mckusick D.O.   On: 07/01/2018 16:32     Scheduled Meds: . sodium chloride   Intravenous Once  . sodium chloride   Intravenous Once  . sodium chloride   Intravenous Once  . allopurinol  100 mg Oral Daily  . carvedilol  12.5 mg Oral Q1400  . clopidogrel  75 mg Oral Daily  . colchicine  0.6 mg Oral BID  . docusate sodium  100 mg Oral Daily  . dorzolamide-timolol  1 drop Both Eyes BID  . feeding supplement (GLUCERNA SHAKE)  237 mL Oral BID BM  . feeding supplement (PRO-STAT SUGAR FREE 64)  30 mL Oral Daily  . insulin aspart  0-15 Units Subcutaneous TID WC  . insulin aspart  0-5 Units Subcutaneous QHS  . insulin aspart  4 Units Subcutaneous TID WC  . insulin glargine  20 Units Subcutaneous QHS  . latanoprost  1 drop Both Eyes QHS  . losartan  100 mg Oral Q1400  . pantoprazole  40 mg Oral Daily  . pravastatin  40 mg Oral QHS  . pregabalin  50-100 mg Oral QHS  . sodium chloride flush  3 mL Intravenous Q12H  . triamterene-hydrochlorothiazide  1 tablet Oral Q1400   Continuous Infusions: . heparin 1,750 Units/hr (07/03/18 0855)     LOS: 6 days   Time Spent in minutes    30 minutes  Maryjo Ragon D.O. on 07/03/2018 at 2:17 PM  Between 7am to 7pm - Please see pager noted on [email or web address]  After 7pm go to [email or web address]  And look for the night coverage person covering for me after hours  [facility] Office  [phone]

## 2018-07-03 NOTE — Progress Notes (Addendum)
  Progress Note    07/03/2018 7:14 AM 2 Days Post-Op  Subjective:  Says he can move his leg better from side to side.  Pain in his thigh is better.   Afebrile VSS  Vitals:   07/02/18 2347 07/03/18 0434  BP: 119/72 123/77  Pulse: 72   Resp: 18 (!) 21  Temp: 98.7 F (37.1 C) 98.5 F (36.9 C)  SpO2: 96% 97%    Physical Exam: Cardiac:  regular Lungs:  Non labored Incisions:  Left thigh and thigh incisions are clean and dry; medial and lateral incisions on lower leg clean with staples in tact.  Toe amp site is clean without purulence.   Extremities:  Palpable left AT pulse   CBC    Component Value Date/Time   WBC 11.4 (H) 07/03/2018 0311   RBC 3.15 (L) 07/03/2018 0311   HGB 8.3 (L) 07/03/2018 0311   HCT 25.4 (L) 07/03/2018 0311   PLT 212 07/03/2018 0311   MCV 80.6 07/03/2018 0311   MCH 26.3 07/03/2018 0311   MCHC 32.7 07/03/2018 0311   RDW 18.3 (H) 07/03/2018 0311   LYMPHSABS 0.6 (L) 06/27/2018 1045   MONOABS 0.9 06/27/2018 1045   EOSABS 0.1 06/27/2018 1045   BASOSABS 0.0 06/27/2018 1045    BMET    Component Value Date/Time   NA 137 07/03/2018 0311   K 4.1 07/03/2018 0311   CL 108 07/03/2018 0311   CO2 21 (L) 07/03/2018 0311   GLUCOSE 152 (H) 07/03/2018 0311   BUN 20 07/03/2018 0311   CREATININE 1.50 (H) 07/03/2018 0311   CALCIUM 7.9 (L) 07/03/2018 0311   GFRNONAA 47 (L) 07/03/2018 0311   GFRAA 55 (L) 07/03/2018 0311    INR    Component Value Date/Time   INR 0.93 12/01/2012 1908     Intake/Output Summary (Last 24 hours) at 07/03/2018 0714 Last data filed at 07/03/2018 0438 Gross per 24 hour  Intake 1225 ml  Output 2625 ml  Net -1400 ml     Assessment:  68 y.o. male is s/p:  1.Harvest left greater saphenous vein 2.Left peroneal artery thrombectomy 3.Left SFA to peroneal artery bypass with non-reversed greater saphenous vein 4.Left lower extremity angiogram 5.Left SFA to anterior tibial artery bypass with ipsilateral non-reversed  greater saphenous vein 6. Amputation left toes 2 through 5   2 Days Post-Op  Plan: -pt with palpable left AT pulse -incisions are healing nicely. -acute blood loss anemia improved after 2 units PRBC's.   Will restart heparin today (ordered) -toe amp site is clean without evidence of infection.  Will have [place] place wound vac.  -will discontinue abx -creatinine increased today to 1.5.  May need to dc ARB-will defer to primary team. -PT today with Darco shoe    Leontine Locket, PA-C [facility] [phone] 07/03/2018 7:14 AM    I have interviewed and examined patient with PA and agree with assessment and plan above. Palpable dp and wound bed is healthy without any residual infection or necrosis. Plan for wound vac, pt with darco shoe, dc abx and restart anticoagulation.  Brandon C. Donzetta Matters, MD [facility] Office: [phone] Pager: [phone]

## 2018-07-04 LAB — GLUCOSE, CAPILLARY
Glucose-Capillary: 161 mg/dL — ABNORMAL HIGH (ref 70–99)
Glucose-Capillary: 145 mg/dL — ABNORMAL HIGH (ref 70–99)
Glucose-Capillary: 111 mg/dL — ABNORMAL HIGH (ref 70–99)
Glucose-Capillary: 150 mg/dL — ABNORMAL HIGH (ref 70–99)

## 2018-07-04 LAB — CBC
HCT: 25.2 % — ABNORMAL LOW (ref 39.0–52.0)
Hemoglobin: 8.2 g/dL — ABNORMAL LOW (ref 13.0–17.0)
MCH: 26.8 pg (ref 26.0–34.0)
MCHC: 32.5 g/dL (ref 30.0–36.0)
MCV: 82.4 fL (ref 80.0–100.0)
Platelets: 228 10*3/uL (ref 150–400)
RBC: 3.06 MIL/uL — ABNORMAL LOW (ref 4.22–5.81)
RDW: 19.2 % — ABNORMAL HIGH (ref 11.5–15.5)
WBC: 11.7 10*3/uL — ABNORMAL HIGH (ref 4.0–10.5)
nRBC: 0 % (ref 0.0–0.2)

## 2018-07-04 LAB — BASIC METABOLIC PANEL
Anion gap: 7 (ref 5–15)
BUN: 19 mg/dL (ref 8–23)
CO2: 21 mmol/L — ABNORMAL LOW (ref 22–32)
Calcium: 8.2 mg/dL — ABNORMAL LOW (ref 8.9–10.3)
Chloride: 105 mmol/L (ref 98–111)
Creatinine, Ser: 1.44 mg/dL — ABNORMAL HIGH (ref 0.61–1.24)
GFR calc Af Amer: 58 mL/min — ABNORMAL LOW (ref >60)
GFR calc non Af Amer: 50 mL/min — ABNORMAL LOW (ref >60)
Glucose, Bld: 168 mg/dL — ABNORMAL HIGH (ref 70–99)
Potassium: 3.9 mmol/L (ref 3.5–5.1)
Sodium: 133 mmol/L — ABNORMAL LOW (ref 135–145)

## 2018-07-04 MED ORDER — SODIUM CHLORIDE 0.9 % IV SOLN
510.0000 mg | Freq: Once | INTRAVENOUS | Status: AC
  Administered 2018-07-04: 510 mg via INTRAVENOUS
  Filled 2018-07-04: qty 17

## 2018-07-04 NOTE — Progress Notes (Signed)
PROGRESS NOTE    [name]  ZOX:[id number] DOB: 24-Aug-1950 DOA: 06/27/2018 PCP: [name]   Brief Narrative:  HPI on 06/27/2018 by [name] is a 68 y.o. 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  The patient was treated for restenosis of his left SFA in Oct complicated by in stent thrombosis and requiring 2 repeat angiograms for stenting. 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  Since then, he seemed to be improving, was ambulating but not back to work until about one week prior to admission when he started to have new pain, redness and swelling of his left foot.   Two days ago he was seen in wound care, had a new ulcer on the left THIRD digit, was started on Levaquin which he has taken.  Then today, this third digit was completely purple, so he was sent from wound care center for IV antibiotics.  He has had no fever, chills, vomiting, LOC, confusion.    Interim history Patient admitted with gangrene of the left toes.  Vascular surgery consulted and appreciated.  Status post angiogram as well as amputation of the left toes #2 through 5, left SFA to peroneal artery bypass, left SFA to anterior tibial artery bypass. 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 -Patient with critical left lower extremity ischemia with necrotic toes on admission -MRI of the left foot showed osteonecrosis of the distal phalanx of the little toe, marrow edema in the proximal phalanx of the fifth toe consistent with osteomyelitis, bony destructive changes of the tuft of the third toe consistent with osteomyelitis, soft tissue gas about the distal phalanx of the third toe consistent with cellulitis -Vascular surgery consulted and appreciated -S/post  angiogram on 06/30/2018, amputation of left toes 2 through 5, left SFA to peroneal artery bypass, left SFA to anterior tibial artery bypass, angiogram -Vancomycin and Zosyn discontinued -Was transfused 2 units PRBC -PT/OT recommending SNF with wheelchair -Social work consulted -wound vac in place -Patient to follow up with vascular surgery, Dr. Donzetta [name] in 2 weeks  Diabetes mellitus, type II -Hemoglobin A1c 6.5 -Continue Lantus, insulin sliding scale and CBG monitoring -Diabetes coordinator consulted  Left popliteal vein DVT -Lower extremity Doppler on 06/28/2018 showed left popliteal vein DVT -Xarelto currently held per vascular surgery -restarted Xarelto  Essential hypertension -Continue Coreg, Cozaar, triamterene/HCTZ, IV hydralazine PRN  Gout -Stable, continue allopurinol, colchicine  Chronic anemia -Baseline hemoglobin approximately 8, dropped to 7 -Patient was given 2 units PRBC, currently hemoglobin 8.2 -Continue to monitor CBC  CKD, stage III -Creatinine appears to be at baseline (1.5-1.7) -Continue to monitor BMP  DVT Prophylaxis Xarelto  Code Status: Full  Family Communication: None at bedside  Disposition Plan: Admitted. Pending SNF placement  Consultants Vascular surgery  Procedures  Procedure Performed: 06/30/2018 1.Ultrasound-guided access of the right common femoral artery 2.Aortogram 3.Left lower extremity arteriogram with selection of second order branches 4.45 minutesofmonitored moderate conscious sedation time 5.Mynx closure of the right common femoral artery  Antibiotics   Anti-infectives (From admission, onward)   Start     Dose/Rate Route Frequency Ordered Stop   07/02/18 1000  vancomycin (VANCOCIN) 1,500 mg in sodium chloride 0.9 % 500 mL IVPB     1,500 mg 250  mL/hr over 120 Minutes Intravenous Every 24 hours 07/01/18 2026 07/02/18 1057   07/01/18 0900  vancomycin (VANCOCIN) 1,500 mg in sodium chloride 0.9 % 500 mL IVPB     1,500  mg 250 mL/hr over 120 Minutes Intravenous To [facility] 07/01/18 0855 07/01/18 1023   06/29/18 0900  vancomycin (VANCOCIN) 1,500 mg in sodium chloride 0.9 % 500 mL IVPB  Status:  Discontinued     1,500 mg 250 mL/hr over 120 Minutes Intravenous Every 24 hours 06/28/18 1443 07/01/18 0855   06/28/18 1500  metroNIDAZOLE (FLAGYL) IVPB 500 mg     500 mg 100 mL/hr over 60 Minutes Intravenous Every 8 hours 06/28/18 1443 07/02/18 2359   06/28/18 1500  cefTRIAXone (ROCEPHIN) 2 g in sodium chloride 0.9 % 100 mL IVPB     2 g 200 mL/hr over 30 Minutes Intravenous Every 24 hours 06/28/18 1443 07/02/18 2359   06/28/18 1000  vancomycin (VANCOCIN) 1,500 mg in sodium chloride 0.9 % 500 mL IVPB  Status:  Discontinued     1,500 mg 250 mL/hr over 120 Minutes Intravenous Every 24 hours 06/27/18 1536 06/28/18 1435   06/27/18 1500  cefTRIAXone (ROCEPHIN) 2 g in sodium chloride 0.9 % 100 mL IVPB  Status:  Discontinued     2 g 200 mL/hr over 30 Minutes Intravenous Every 24 hours 06/27/18 1414 06/28/18 1435   06/27/18 1500  metroNIDAZOLE (FLAGYL) IVPB 500 mg  Status:  Discontinued     500 mg 100 mL/hr over 60 Minutes Intravenous Every 8 hours 06/27/18 1414 06/28/18 1435   06/27/18 1015  vancomycin (VANCOCIN) 2,000 mg in sodium chloride 0.9 % 500 mL IVPB     2,000 mg 250 mL/hr over 120 Minutes Intravenous  Once 06/27/18 1013 06/27/18 1247      Subjective:   [name] seen and examined today.  Patient states that he has had more drainage from his incisions today.  Able to move toes.  Denies current chest pain, shortness breath, abdominal pain, nausea or vomiting, diarrhea or constipation, dizziness or headache.   Objective:   Vitals:   07/03/18 2050 07/04/18 0020 07/04/18 0431 07/04/18 0809  BP: 117/62 114/66 108/77 111/80  Pulse: 72 74 70   Resp: 18 18 14    Temp: 98.8 F (37.1 C) 98.6 F (37 C) 98.5 F (36.9 C)   TempSrc: Oral Oral Oral   SpO2: 100% 98% 99%   Weight:      Height:         Intake/Output Summary (Last 24 hours) at 07/04/2018 1047 Last data filed at 07/04/2018 5329 Gross per 24 hour  Intake 476.1 ml  Output 2825 ml  Net -2348.9 ml   Filed Weights   06/27/18 0857  Weight: 104.3 kg   Exam  General: Well developed, well nourished, NAD, appears stated age  HEENT: NCAT, mucous membranes moist.   Neck: Supple  Cardiovascular: S1 S2 auscultated, RRR, no murmur  Respiratory: Clear to auscultation bilaterally with equal chest rise  Abdomen: Soft, nontender, nondistended, + bowel sounds  Extremities: warm dry without cyanosis clubbing.  Left foot with wound VAC.  Serous drainage from incisional sites.  Neuro: AAOx3, nonfocal  Psych: Normal affect and demeanor, pleasant  Data Reviewed: I have personally reviewed following labs and imaging studies  CBC: Recent Labs  Lab 06/30/18 0503 07/01/18 0243  07/01/18 1556 07/02/18 0506 07/02/18 1935 07/03/18 0311 07/04/18 0347  WBC 9.9 10.6*  --   --  9.1  --  11.4* 11.7*  HGB 8.2* 8.6*   < > 8.2* 7.0* 8.4* 8.3* 8.2*  HCT 26.6* 27.3*   < > 24.0* 20.9* 26.1* 25.4* 25.2*  MCV 74.3* 74.8*  --   --  78.9*  --  80.6 82.4  PLT 314 345  --   --  248  --  212 228   < > = values in this interval not displayed.   Basic Metabolic Panel: Recent Labs  Lab 06/30/18 0503 07/01/18 0243  07/01/18 1349 07/01/18 1556 07/02/18 0506 07/03/18 0311 07/04/18 0347  NA 132* 136   < > 135 136 136 137 133*  K 3.8 3.9   < > 4.5 4.9 4.5 4.1 3.9  CL 100 102  --   --   --  109 108 105  CO2 23 24  --   --   --  21* 21* 21*  GLUCOSE 143* 180*  --   --   --  219* 152* 168*  BUN 17 15  --   --   --  15 20 19   CREATININE 1.31* 1.23  --   --   --  1.23 1.50* 1.44*  CALCIUM 8.9 8.9  --   --   --  8.2* 7.9* 8.2*   < > = values in this interval not displayed.   GFR: Estimated Creatinine Clearance: 63.2 mL/min (A) (by C-G formula based on SCr of 1.44 mg/dL (H)). Liver Function Tests: No results for input(s): AST, ALT,  ALKPHOS, BILITOT, PROT, ALBUMIN in the last 168 hours. No results for input(s): LIPASE, AMYLASE in the last 168 hours. No results for input(s): AMMONIA in the last 168 hours. Coagulation Profile: No results for input(s): INR, PROTIME in the last 168 hours. Cardiac Enzymes: No results for input(s): CKTOTAL, CKMB, CKMBINDEX, TROPONINI in the last 168 hours. BNP (last 3 results) No results for input(s): PROBNP in the last 8760 hours. HbA1C: No results for input(s): HGBA1C in the last 72 hours. CBG: Recent Labs  Lab 07/03/18 0614 07/03/18 1108 07/03/18 1637 07/03/18 2054 07/04/18 0629  GLUCAP 141* 197* 222* 156* 161*   Lipid Profile: No results for input(s): CHOL, HDL, LDLCALC, TRIG, CHOLHDL, LDLDIRECT in the last 72 hours. Thyroid Function Tests: No results for input(s): TSH, T4TOTAL, FREET4, T3FREE, THYROIDAB in the last 72 hours. Anemia Panel: No results for input(s): VITAMINB12, FOLATE, FERRITIN, TIBC, IRON, RETICCTPCT in the last 72 hours. Urine analysis:    Component Value Date/Time   COLORURINE YELLOW 04/29/2018 0820   APPEARANCEUR CLEAR 04/29/2018 0820   LABSPEC 1.013 04/29/2018 0820   PHURINE 5.0 04/29/2018 0820   GLUCOSEU 50 (A) 04/29/2018 0820   HGBUR NEGATIVE 04/29/2018 0820   BILIRUBINUR NEGATIVE 04/29/2018 0820   KETONESUR NEGATIVE 04/29/2018 0820   PROTEINUR NEGATIVE 04/29/2018 0820   UROBILINOGEN 0.2 12/01/2012 2135   NITRITE NEGATIVE 04/29/2018 0820   LEUKOCYTESUR NEGATIVE 04/29/2018 0820   Sepsis Labs: @LABRCNTIP (procalcitonin:4,lacticidven:4)  ) Recent Results (from the past 240 hour(s))  MRSA PCR Screening     Status: Abnormal   Collection Time: 06/27/18  4:06 PM  Result Value Ref Range Status   MRSA by PCR POSITIVE (A) NEGATIVE Final    Comment:        The GeneXpert MRSA Assay (FDA approved for NASAL specimens only), is one component of a comprehensive MRSA colonization surveillance program. It is not intended to diagnose MRSA infection nor  to guide or monitor treatment for MRSA infections. RESULT CALLED TO, READ BACK BY AND VERIFIED WITH: R BRIASS  RN [phone] [id number] BY GF Performed at [facility], [place] [address] [place], [place] [address]       Radiology Studies: Vas [place] Abi With/wo Tbi  Result Date: 07/03/2018 LOWER EXTREMITY DOPPLER STUDY Indications: Peripheral artery disease, and PO BPG.  Comparison Study: 05/02/18 Performing Technologist: Carlos Levering RVT  Examination Guidelines: A complete evaluation includes at minimum, Doppler waveform signals and systolic blood pressure reading at the level of bilateral brachial, anterior tibial, and posterior tibial arteries, when vessel segments are accessible. Bilateral testing is considered an integral part of a complete examination. Photoelectric Plethysmograph (PPG) waveforms and toe systolic pressure readings are included as required and additional duplex testing as needed. Limited examinations for reoccurring indications may be performed as noted.  ABI Findings: +---------+------------------+-----+---------+--------+ Right    Rt Pressure (mmHg)IndexWaveform Comment  +---------+------------------+-----+---------+--------+ Brachial 133                    triphasic         +---------+------------------+-----+---------+--------+ PTA      117               0.88 biphasic          +---------+------------------+-----+---------+--------+ DP       145               1.09 triphasic         +---------+------------------+-----+---------+--------+ Great Toe87                0.65                   +---------+------------------+-----+---------+--------+ +---------+------------------+-----+----------+-------+ Left     Lt Pressure (mmHg)IndexWaveform  Comment +---------+------------------+-----+----------+-------+ Brachial 112                    triphasic         +---------+------------------+-----+----------+-------+ PTA      100               0.75  monophasic        +---------+------------------+-----+----------+-------+ DP       115               0.86 monophasic        +---------+------------------+-----+----------+-------+ Great Toe41                0.31                   +---------+------------------+-----+----------+-------+ +-------+-----------+-----------+------------+------------+ ABI/TBIToday's ABIToday's TBIPrevious ABIPrevious TBI +-------+-----------+-----------+------------+------------+ Right  1.09       0.65       0.82                     +-------+-----------+-----------+------------+------------+ Left   0.86       0.31       0.43                     +-------+-----------+-----------+------------+------------+  Summary: Right: Resting right ankle-brachial index is within normal range. No evidence of significant right lower extremity arterial disease. The right toe-brachial index is abnormal. Left: Resting left ankle-brachial index indicates mild left lower extremity arterial disease. The left toe-brachial index is abnormal.  *See table(s) above for measurements and observations.  Electronically signed by Quay Burow [name] on 07/03/2018 at 6:06:05 PM.   Final      Scheduled Meds: . sodium chloride   Intravenous Once  . sodium chloride   Intravenous Once  . sodium chloride  Intravenous Once  . allopurinol  100 mg Oral Daily  . carvedilol  12.5 mg Oral Q1400  . clopidogrel  75 mg Oral Daily  . colchicine  0.6 mg Oral BID  . docusate sodium  100 mg Oral Daily  . dorzolamide-timolol  1 drop Both Eyes BID  . feeding supplement (GLUCERNA SHAKE)  237 mL Oral BID BM  . feeding supplement (PRO-STAT SUGAR FREE 64)  30 mL Oral Daily  . insulin aspart  0-15 Units Subcutaneous TID WC  . insulin aspart  0-5 Units Subcutaneous QHS  . insulin aspart  4 Units Subcutaneous TID WC  . insulin glargine  20 Units Subcutaneous QHS  . latanoprost  1 drop Both Eyes QHS  . losartan  100 mg Oral Q1400  . pantoprazole  40  mg Oral Daily  . pravastatin  40 mg Oral QHS  . pregabalin  50-100 mg Oral QHS  . rivaroxaban  15 mg Oral BID WC   Followed by  . [START ON 07/25/2018] rivaroxaban  15 mg Oral Q supper  . sodium chloride flush  3 mL Intravenous Q12H  . triamterene-hydrochlorothiazide  1 tablet Oral Q1400   Continuous Infusions:    LOS: 7 days   Time Spent in minutes   30 minutes  Krishauna Schatzman D.O. on 07/04/2018 at 10:47 AM  Between 7am to 7pm - Please see pager noted on [email or web address]  After 7pm go to [email or web address]  And look for the night coverage person covering for me after hours  [facility] Office  [phone]

## 2018-07-04 NOTE — Progress Notes (Signed)
Set CPAP up for patient and placed at bedside. Patient states he would like to place himself on. I assured patient he could call me if he needed any help. RT will continue to monitor.

## 2018-07-04 NOTE — Progress Notes (Signed)
  Progress Note    07/04/2018 8:46 AM 3 Days Post-Op  Subjective: Complains of weakness in left leg although is improving from immediately postop  Vitals:   07/04/18 0431 07/04/18 0809  BP: 108/77 111/80  Pulse: 70   Resp: 14   Temp: 98.5 F (36.9 C)   SpO2: 99%     Physical Exam: Awake alert oriented Nonlabored respirations Left lower extremity incisions above the knee are clean dry intact and closed Below the knee and the incisions have staples and have minimal serous drainage There is stable venous insufficiency changes to his left leg There is a palpable anterior tibial pulse Wound VAC on left foot is to suction  CBC    Component Value Date/Time   WBC 11.7 (H) 07/04/2018 0347   RBC 3.06 (L) 07/04/2018 0347   HGB 8.2 (L) 07/04/2018 0347   HCT 25.2 (L) 07/04/2018 0347   PLT 228 07/04/2018 0347   MCV 82.4 07/04/2018 0347   MCH 26.8 07/04/2018 0347   MCHC 32.5 07/04/2018 0347   RDW 19.2 (H) 07/04/2018 0347   LYMPHSABS 0.6 (L) 06/27/2018 1045   MONOABS 0.9 06/27/2018 1045   EOSABS 0.1 06/27/2018 1045   BASOSABS 0.0 06/27/2018 1045    BMET    Component Value Date/Time   NA 133 (L) 07/04/2018 0347   K 3.9 07/04/2018 0347   CL 105 07/04/2018 0347   CO2 21 (L) 07/04/2018 0347   GLUCOSE 168 (H) 07/04/2018 0347   BUN 19 07/04/2018 0347   CREATININE 1.44 (H) 07/04/2018 0347   CALCIUM 8.2 (L) 07/04/2018 0347   GFRNONAA 50 (L) 07/04/2018 0347   GFRAA 58 (L) 07/04/2018 0347    INR    Component Value Date/Time   INR 0.93 12/01/2012 1908     Intake/Output Summary (Last 24 hours) at 07/04/2018 0846 Last data filed at 07/04/2018 1552 Gross per 24 hour  Intake 716.1 ml  Output 3105 ml  Net -2388.9 ml     Assessment/plan:  68 y.o. male is s/p left SFA to anterior tibial artery bypass with toes 2 through 5 amputated.  Wound VAC is now in place.  He has significant weakness of this leg did have some weakness going into the surgery and I imagine some is  inflammation from the extent of his surgery.  He would benefit from continued physical therapy and will need SNF on discharge.  Okay to continue Xarelto.  We will set him up for follow-up visit in a few weeks for wound check.   [name] C. Donzetta Matters, MD [facility] Office: [phone] Pager: [phone]  07/04/2018 8:46 AM

## 2018-07-05 LAB — BASIC METABOLIC PANEL
Anion gap: 9 (ref 5–15)
BUN: 19 mg/dL (ref 8–23)
CO2: 21 mmol/L — ABNORMAL LOW (ref 22–32)
Calcium: 8.4 mg/dL — ABNORMAL LOW (ref 8.9–10.3)
Chloride: 104 mmol/L (ref 98–111)
Creatinine, Ser: 1.29 mg/dL — ABNORMAL HIGH (ref 0.61–1.24)
GFR calc Af Amer: >60 mL/min (ref >60)
GFR calc non Af Amer: 57 mL/min — ABNORMAL LOW (ref >60)
Glucose, Bld: 177 mg/dL — ABNORMAL HIGH (ref 70–99)
Potassium: 3.8 mmol/L (ref 3.5–5.1)
Sodium: 134 mmol/L — ABNORMAL LOW (ref 135–145)

## 2018-07-05 LAB — HEMOGLOBIN AND HEMATOCRIT, BLOOD
HCT: 24.5 % — ABNORMAL LOW (ref 39.0–52.0)
Hemoglobin: 7.8 g/dL — ABNORMAL LOW (ref 13.0–17.0)

## 2018-07-05 LAB — GLUCOSE, CAPILLARY
Glucose-Capillary: 148 mg/dL — ABNORMAL HIGH (ref 70–99)
Glucose-Capillary: 148 mg/dL — ABNORMAL HIGH (ref 70–99)
Glucose-Capillary: 162 mg/dL — ABNORMAL HIGH (ref 70–99)
Glucose-Capillary: 158 mg/dL — ABNORMAL HIGH (ref 70–99)

## 2018-07-05 MED ORDER — RIVAROXABAN 15 MG PO TABS
15.0000 mg | ORAL_TABLET | Freq: Two times a day (BID) | ORAL | Status: DC
  Administered 2018-07-05 – 2018-07-10 (×11): 15 mg via ORAL
  Filled 2018-07-05 (×11): qty 1

## 2018-07-05 MED ORDER — RIVAROXABAN 20 MG PO TABS: 20.0000 mg | ORAL_TABLET | Freq: Every day | ORAL | Status: DC

## 2018-07-05 NOTE — Progress Notes (Signed)
Pt set up with CPAP for the night. 

## 2018-07-05 NOTE — Progress Notes (Signed)
PROGRESS NOTE    [name]  CZY:[id number] DOB: 1950-07-09 DOA: 06/27/2018 PCP: [name], MD   Brief Narrative:  HPI on 06/27/2018 by Dr. Myrene Buddy [name] is a 68 y.o. 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  The patient was treated for restenosis of his left SFA in Oct complicated by in stent thrombosis and requiring 2 repeat angiograms for stenting. 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  Since then, he seemed to be improving, was ambulating but not back to work until about one week prior to admission when he started to have new pain, redness and swelling of his left foot.   Two days ago he was seen in wound care, had a new ulcer on the left THIRD digit, was started on Levaquin which he has taken.  Then today, this third digit was completely purple, so he was sent from wound care center for IV antibiotics.  He has had no fever, chills, vomiting, LOC, confusion.    Interim history Patient admitted with gangrene of the left toes.  Vascular surgery consulted and appreciated.  Status post angiogram as well as amputation of the left toes #2 through 5, left SFA to peroneal artery bypass, left SFA to anterior tibial artery bypass. 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 -Patient with critical left lower extremity ischemia with necrotic toes on admission -MRI of the left foot showed osteonecrosis of the distal phalanx of the little toe, marrow edema in the proximal phalanx of the fifth toe consistent with osteomyelitis, bony destructive changes of the tuft of the third toe consistent with osteomyelitis, soft tissue gas about the distal phalanx of the third toe consistent with cellulitis -Vascular surgery consulted and appreciated -S/post  angiogram on 06/30/2018, amputation of left toes 2 through 5, left SFA to peroneal artery bypass, left SFA to anterior tibial artery bypass, angiogram -Vancomycin and Zosyn discontinued -Was transfused 2 units PRBC -PT/OT recommending SNF with wheelchair -Social work consulted -wound vac in place, vascular planning for replacement on 07/07/2018 -Patient to follow up with vascular surgery, Dr. Donzetta Matters in 2 weeks  Diabetes mellitus, type II -Hemoglobin A1c 6.5 -Continue Lantus, insulin sliding scale and CBG monitoring -Diabetes coordinator consulted  Left popliteal vein DVT -Lower extremity Doppler on 06/28/2018 showed left popliteal vein DVT -Xarelto currently held per vascular surgery -Continue Xarelto  Essential hypertension -Continue Coreg, Cozaar, triamterene/HCTZ, IV hydralazine PRN  Gout -Stable, continue allopurinol, colchicine  Chronic anemia -Baseline hemoglobin approximately 8, dropped to 7 -Patient was given 2 units PRBC, currently hemoglobin 7.8 -anemia panel showed an iron of 15, ferritin 179 -Patient given dose of Feraheme on 07/04/2018 -Continue to monitor CBC  CKD, stage III -Creatinine appears to be at baseline (1.5-1.7) -Continue to monitor BMP  DVT Prophylaxis Xarelto  Code Status: Full  Family Communication: None at bedside  Disposition Plan: Admitted. Pending SNF placement-suspect on 07/07/2018  Consultants Vascular surgery  Procedures  Procedure Performed: 06/30/2018 1.Ultrasound-guided access of the right common femoral artery 2.Aortogram 3.Left lower extremity arteriogram with selection of second order branches 4.45 minutesofmonitored moderate conscious sedation time 5.Mynx closure of the right common femoral artery  Antibiotics   Anti-infectives (From admission, onward)   Start     Dose/Rate Route Frequency Ordered Stop  07/02/18 1000  vancomycin (VANCOCIN) 1,500 mg in sodium chloride 0.9 % 500 mL IVPB     1,500 mg 250 mL/hr over 120  Minutes Intravenous Every 24 hours 07/01/18 2026 07/02/18 1057   07/01/18 0900  vancomycin (VANCOCIN) 1,500 mg in sodium chloride 0.9 % 500 mL IVPB     1,500 mg 250 mL/hr over 120 Minutes Intravenous To [facility] 07/01/18 0855 07/01/18 1023   06/29/18 0900  vancomycin (VANCOCIN) 1,500 mg in sodium chloride 0.9 % 500 mL IVPB  Status:  Discontinued     1,500 mg 250 mL/hr over 120 Minutes Intravenous Every 24 hours 06/28/18 1443 07/01/18 0855   06/28/18 1500  metroNIDAZOLE (FLAGYL) IVPB 500 mg     500 mg 100 mL/hr over 60 Minutes Intravenous Every 8 hours 06/28/18 1443 07/02/18 2359   06/28/18 1500  cefTRIAXone (ROCEPHIN) 2 g in sodium chloride 0.9 % 100 mL IVPB     2 g 200 mL/hr over 30 Minutes Intravenous Every 24 hours 06/28/18 1443 07/02/18 2359   06/28/18 1000  vancomycin (VANCOCIN) 1,500 mg in sodium chloride 0.9 % 500 mL IVPB  Status:  Discontinued     1,500 mg 250 mL/hr over 120 Minutes Intravenous Every 24 hours 06/27/18 1536 06/28/18 1435   06/27/18 1500  cefTRIAXone (ROCEPHIN) 2 g in sodium chloride 0.9 % 100 mL IVPB  Status:  Discontinued     2 g 200 mL/hr over 30 Minutes Intravenous Every 24 hours 06/27/18 1414 06/28/18 1435   06/27/18 1500  metroNIDAZOLE (FLAGYL) IVPB 500 mg  Status:  Discontinued     500 mg 100 mL/hr over 60 Minutes Intravenous Every 8 hours 06/27/18 1414 06/28/18 1435   06/27/18 1015  vancomycin (VANCOCIN) 2,000 mg in sodium chloride 0.9 % 500 mL IVPB     2,000 mg 250 mL/hr over 120 Minutes Intravenous  Once 06/27/18 1013 06/27/18 1247      Subjective:   [name] seen and examined today.  Patient denies any current pain.  Able to move left foot more today.  Denies current chest pain, shortness of breath, abdominal pain, nausea or vomiting, diarrhea or constipation, dizziness or headache.  Objective:   Vitals:   07/04/18 2100 07/04/18 2347 07/05/18 0314 07/05/18 0824  BP: (!) 140/97 102/70 122/78 117/79  Pulse: 83 70 66 73  Resp: 15 16  (!) 22 (!) 26  Temp: 100.1 F (37.8 C) 99 F (37.2 C) 98.6 F (37 C) 98.8 F (37.1 C)  TempSrc:  Oral Oral Oral  SpO2: 99% 98% 98% 98%  Weight:      Height:        Intake/Output Summary (Last 24 hours) at 07/05/2018 1110 Last data filed at 07/05/2018 0219 Gross per 24 hour  Intake 493 ml  Output 2075 ml  Net -1582 ml   Filed Weights   06/27/18 0857  Weight: 104.3 kg   Exam  General: Well developed, well nourished, NAD, appears stated age  HEENT: NCAT, mucous membranes moist.   Neck: Supple  Cardiovascular: S1 S2 auscultated, RRR, no murmur  Respiratory: Clear to auscultation bilaterally with equal chest rise  Abdomen: Soft, nontender, nondistended, + bowel sounds  Extremities: warm dry without cyanosis clubbing.  Left foot with wound VAC.  Dressings in place.  Neuro: AAOx3, nonfocal  Psych: Pleasant, appropriate mood and affect  Data Reviewed: I have personally reviewed following labs and imaging studies  CBC: Recent Labs  Lab 06/30/18 0503 07/01/18 0243  07/02/18 0506 07/02/18 1935 07/03/18  0981 07/04/18 0347 07/05/18 0307  WBC 9.9 10.6*  --  9.1  --  11.4* 11.7*  --   HGB 8.2* 8.6*   < > 7.0* 8.4* 8.3* 8.2* 7.8*  HCT 26.6* 27.3*   < > 20.9* 26.1* 25.4* 25.2* 24.5*  MCV 74.3* 74.8*  --  78.9*  --  80.6 82.4  --   PLT 314 345  --  248  --  212 228  --    < > = values in this interval not displayed.   Basic Metabolic Panel: Recent Labs  Lab 07/01/18 0243  07/01/18 1556 07/02/18 0506 07/03/18 0311 07/04/18 0347 07/05/18 0307  NA 136   < > 136 136 137 133* 134*  K 3.9   < > 4.9 4.5 4.1 3.9 3.8  CL 102  --   --  109 108 105 104  CO2 24  --   --  21* 21* 21* 21*  GLUCOSE 180*  --   --  219* 152* 168* 177*  BUN 15  --   --  15 20 19 19   CREATININE 1.23  --   --  1.23 1.50* 1.44* 1.29*  CALCIUM 8.9  --   --  8.2* 7.9* 8.2* 8.4*   < > = values in this interval not displayed.   GFR: Estimated Creatinine Clearance: 70.5 mL/min (A) (by C-G formula  based on SCr of 1.29 mg/dL (H)). Liver Function Tests: No results for input(s): AST, ALT, ALKPHOS, BILITOT, PROT, ALBUMIN in the last 168 hours. No results for input(s): LIPASE, AMYLASE in the last 168 hours. No results for input(s): AMMONIA in the last 168 hours. Coagulation Profile: No results for input(s): INR, PROTIME in the last 168 hours. Cardiac Enzymes: No results for input(s): CKTOTAL, CKMB, CKMBINDEX, TROPONINI in the last 168 hours. BNP (last 3 results) No results for input(s): PROBNP in the last 8760 hours. HbA1C: No results for input(s): HGBA1C in the last 72 hours. CBG: Recent Labs  Lab 07/04/18 0629 07/04/18 1123 07/04/18 1637 07/04/18 2057 07/05/18 0642  GLUCAP 161* 145* 111* 150* 148*   Lipid Profile: No results for input(s): CHOL, HDL, LDLCALC, TRIG, CHOLHDL, LDLDIRECT in the last 72 hours. Thyroid Function Tests: No results for input(s): TSH, T4TOTAL, FREET4, T3FREE, THYROIDAB in the last 72 hours. Anemia Panel: No results for input(s): VITAMINB12, FOLATE, FERRITIN, TIBC, IRON, RETICCTPCT in the last 72 hours. Urine analysis:    Component Value Date/Time   COLORURINE YELLOW 04/29/2018 0820   APPEARANCEUR CLEAR 04/29/2018 0820   LABSPEC 1.013 04/29/2018 0820   PHURINE 5.0 04/29/2018 0820   GLUCOSEU 50 (A) 04/29/2018 0820   HGBUR NEGATIVE 04/29/2018 0820   BILIRUBINUR NEGATIVE 04/29/2018 0820   KETONESUR NEGATIVE 04/29/2018 0820   PROTEINUR NEGATIVE 04/29/2018 0820   UROBILINOGEN 0.2 12/01/2012 2135   NITRITE NEGATIVE 04/29/2018 0820   LEUKOCYTESUR NEGATIVE 04/29/2018 0820   Sepsis Labs: @LABRCNTIP (procalcitonin:4,lacticidven:4)  ) Recent Results (from the past 240 hour(s))  MRSA PCR Screening     Status: Abnormal   Collection Time: 06/27/18  4:06 PM  Result Value Ref Range Status   MRSA by PCR POSITIVE (A) NEGATIVE Final    Comment:        The GeneXpert MRSA Assay (FDA approved for NASAL specimens only), is one component of a comprehensive  MRSA colonization surveillance program. It is not intended to diagnose MRSA infection nor to guide or monitor treatment for MRSA infections. RESULT CALLED TO, READ BACK BY AND VERIFIED WITH: R BRIASS  RN [phone] 1902 BY GF Performed at [facility], [place] [address] [place], [place] [address]       Radiology Studies: Vas [place] Abi With/wo Tbi  Result Date: 07/03/2018 LOWER EXTREMITY DOPPLER STUDY Indications: Peripheral artery disease, and PO BPG.  Comparison Study: 05/02/18 Performing Technologist: Carlos Levering RVT  Examination Guidelines: A complete evaluation includes at minimum, Doppler waveform signals and systolic blood pressure reading at the level of bilateral brachial, anterior tibial, and posterior tibial arteries, when vessel segments are accessible. Bilateral testing is considered an integral part of a complete examination. Photoelectric Plethysmograph (PPG) waveforms and toe systolic pressure readings are included as required and additional duplex testing as needed. Limited examinations for reoccurring indications may be performed as noted.  ABI Findings: +---------+------------------+-----+---------+--------+ Right    Rt Pressure (mmHg)IndexWaveform Comment  +---------+------------------+-----+---------+--------+ Brachial 133                    triphasic         +---------+------------------+-----+---------+--------+ PTA      117               0.88 biphasic          +---------+------------------+-----+---------+--------+ DP       145               1.09 triphasic         +---------+------------------+-----+---------+--------+ Great Toe87                0.65                   +---------+------------------+-----+---------+--------+ +---------+------------------+-----+----------+-------+ Left     Lt Pressure (mmHg)IndexWaveform  Comment +---------+------------------+-----+----------+-------+ Brachial 112                    triphasic          +---------+------------------+-----+----------+-------+ PTA      100               0.75 monophasic        +---------+------------------+-----+----------+-------+ DP       115               0.86 monophasic        +---------+------------------+-----+----------+-------+ Great Toe41                0.31                   +---------+------------------+-----+----------+-------+ +-------+-----------+-----------+------------+------------+ ABI/TBIToday's ABIToday's TBIPrevious ABIPrevious TBI +-------+-----------+-----------+------------+------------+ Right  1.09       0.65       0.82                     +-------+-----------+-----------+------------+------------+ Left   0.86       0.31       0.43                     +-------+-----------+-----------+------------+------------+  Summary: Right: Resting right ankle-brachial index is within normal range. No evidence of significant right lower extremity arterial disease. The right toe-brachial index is abnormal. Left: Resting left ankle-brachial index indicates mild left lower extremity arterial disease. The left toe-brachial index is abnormal.  *See table(s) above for measurements and observations.  Electronically signed by Quay Burow MD on 07/03/2018 at 6:06:05 PM.   Final      Scheduled Meds: . sodium chloride   Intravenous Once  . sodium chloride   Intravenous Once  . sodium chloride  Intravenous Once  . allopurinol  100 mg Oral Daily  . carvedilol  12.5 mg Oral Q1400  . clopidogrel  75 mg Oral Daily  . colchicine  0.6 mg Oral BID  . docusate sodium  100 mg Oral Daily  . dorzolamide-timolol  1 drop Both Eyes BID  . feeding supplement (GLUCERNA SHAKE)  237 mL Oral BID BM  . feeding supplement (PRO-STAT SUGAR FREE 64)  30 mL Oral Daily  . insulin aspart  0-15 Units Subcutaneous TID WC  . insulin aspart  0-5 Units Subcutaneous QHS  . insulin aspart  4 Units Subcutaneous TID WC  . insulin glargine  20 Units Subcutaneous QHS    . latanoprost  1 drop Both Eyes QHS  . losartan  100 mg Oral Q1400  . pantoprazole  40 mg Oral Daily  . pravastatin  40 mg Oral QHS  . pregabalin  50-100 mg Oral QHS  . rivaroxaban  15 mg Oral BID WC   Followed by  . [START ON 07/25/2018] rivaroxaban  15 mg Oral Q supper  . sodium chloride flush  3 mL Intravenous Q12H  . triamterene-hydrochlorothiazide  1 tablet Oral Q1400   Continuous Infusions:    LOS: 8 days   Time Spent in minutes   30 minutes  Levy Wellman D.O. on 07/05/2018 at 11:10 AM  Between 7am to 7pm - Please see pager noted on [email or web address]  After 7pm go to [email or web address]  And look for the night coverage person covering for me after hours  [facility] Office  [phone]

## 2018-07-05 NOTE — Progress Notes (Signed)
Pt had a 1.50sec miss beat but he is asymptomatic. Will continue to monitor.

## 2018-07-05 NOTE — NC FL2 (Signed)
[place] LEVEL OF CARE SCREENING TOOL     IDENTIFICATION  Patient Name: [name] Birthdate: 1951/04/27 Sex: male Admission Date (Current Location): 06/27/2018  [facility] and [place] Number:  Herbalist and Address:  [facility], [place] [address] [place], [place] [address]      Provider Number: [id number]  Attending Physician Name and Address:  Cristal Ford, DO  Relative Name and Phone Number:       Current Level of Care: Hospital Recommended Level of Care: [place] Prior Approval Number:    Date Approved/Denied:   PASRR Number: [id number]  Discharge Plan: SNF    Current Diagnoses: Patient Active Problem List   Diagnosis Date Noted  . CKD (chronic kidney disease) stage 3, GFR 30-59 ml/min (HCC) 06/28/2018  . PVD (peripheral vascular disease) ([place]) 06/28/2018  . Diabetic foot ulcer ([place]) 06/27/2018  . Anemia 06/27/2018  . Benign essential HTN   . Diabetes mellitus type 2 in obese ([place])   . Acute blood loss anemia   . Tachypnea   . Leukocytosis   . Critical limb ischemia with history of revascularization of same extremity 04/28/2018  . Critical lower limb ischemia 04/28/2018  . Claudication in peripheral vascular disease ([place]) 08/04/2017  . DM (diabetes mellitus) ([place]) 12/02/2012  . HTN (hypertension) 12/02/2012    Orientation RESPIRATION BLADDER Height & Weight     Self, Time, Situation, Place  Normal Continent Weight: 230 lb (104.3 kg) Height:  6\' 1"  (185.4 cm)  BEHAVIORAL SYMPTOMS/MOOD NEUROLOGICAL BOWEL NUTRITION STATUS      Continent Diet(Carb Modified with Thin Liquids)  AMBULATORY STATUS COMMUNICATION OF NEEDS Skin   Extensive Assist Verbally Surgical wounds, Wound Vac, Other (Comment)(Diabetic Ulcers 3rd and 5th Left Toes / Left Leg Incision)                       Personal Care Assistance Level of Assistance  Bathing, Feeding, Dressing Bathing Assistance: Limited  assistance Feeding assistance: Independent Dressing Assistance: Limited assistance     Functional Limitations Info  Sight, Hearing, Speech Sight Info: Impaired Hearing Info: Adequate Speech Info: Adequate    SPECIAL CARE FACTORS FREQUENCY  PT (By licensed PT), OT (By licensed OT)     PT Frequency: 3-5x week OT Frequency: 3-5x week            Contractures Contractures Info: Not present    Additional Factors Info  Code Status, Allergies, Insulin Sliding Scale, Isolation Precautions Code Status Info: Full Code Allergies Info: No Known Allergies   Insulin Sliding Scale Info: Novolog / Lantus Isolation Precautions Info: MRSA     Current Medications (07/05/2018):  This is the current hospital active medication list Current Facility-Administered Medications  Medication Dose Route Frequency Provider Last Rate Last Dose  . 0.9 %  sodium chloride infusion (Manually program via Guardrails IV Fluids)   Intravenous Once Rhyne, Samantha J, PA-C      . 0.9 %  sodium chloride infusion (Manually program via Guardrails IV Fluids)   Intravenous Once Rhyne, Samantha J, PA-C      . 0.9 %  sodium chloride infusion (Manually program via Guardrails IV Fluids)   Intravenous Once Rai, Ripudeep K, MD      . acetaminophen (TYLENOL) tablet 650 mg  650 mg Oral Q6H PRN Rhyne, Samantha J, PA-C       Or  . acetaminophen (TYLENOL) suppository 650 mg  650 mg Rectal Q6H PRN Rhyne,  Hulen Shouts, PA-C      . allopurinol (ZYLOPRIM) tablet 100 mg  100 mg Oral Daily Rhyne, Samantha J, PA-C   100 mg at 07/05/18 0911  . alum & mag hydroxide-simeth (MAALOX/MYLANTA) 200-200-20 MG/5ML suspension 15-30 mL  15-30 mL Oral Q2H PRN Rhyne, Samantha J, PA-C      . carvedilol (COREG) tablet 12.5 mg  12.5 mg Oral Q1400 Rhyne, Samantha J, PA-C   12.5 mg at 07/04/18 1819  . clopidogrel (PLAVIX) tablet 75 mg  75 mg Oral Daily Gabriel Earing, PA-C   75 mg at 07/05/18 0911  . colchicine tablet 0.6 mg  0.6 mg Oral BID Gabriel Earing, PA-C   0.6 mg at 07/05/18 0911  . docusate sodium (COLACE) capsule 100 mg  100 mg Oral Daily Rhyne, Samantha J, PA-C   100 mg at 07/05/18 0910  . dorzolamide-timolol (COSOPT) 22.3-6.8 MG/ML ophthalmic solution 1 drop  1 drop Both Eyes BID Rhyne, Hulen Shouts, PA-C   1 drop at 07/05/18 0911  . feeding supplement (GLUCERNA SHAKE) (GLUCERNA SHAKE) liquid 237 mL  237 mL Oral BID BM Rhyne, Samantha J, PA-C   237 mL at 07/05/18 0911  . feeding supplement (PRO-STAT SUGAR FREE 64) liquid 30 mL  30 mL Oral Daily Rhyne, Samantha J, PA-C   30 mL at 07/05/18 0910  . guaiFENesin-dextromethorphan (ROBITUSSIN DM) 100-10 MG/5ML syrup 15 mL  15 mL Oral Q4H PRN Rhyne, Samantha J, PA-C      . hydrALAZINE (APRESOLINE) injection 5 mg  5 mg Intravenous Q20 Min PRN Rhyne, Samantha J, PA-C      . insulin aspart (novoLOG) injection 0-15 Units  0-15 Units Subcutaneous TID WC Rhyne, Samantha J, PA-C   2 Units at 07/05/18 0702  . insulin aspart (novoLOG) injection 0-5 Units  0-5 Units Subcutaneous QHS Gabriel Earing, PA-C   4 Units at 07/01/18 2150  . insulin aspart (novoLOG) injection 4 Units  4 Units Subcutaneous TID WC Rai, Ripudeep K, MD   4 Units at 07/05/18 0910  . insulin glargine (LANTUS) injection 20 Units  20 Units Subcutaneous QHS Rai, Ripudeep K, MD   20 Units at 07/04/18 2125  . labetalol (NORMODYNE,TRANDATE) injection 10 mg  10 mg Intravenous Q10 min PRN Rhyne, Samantha J, PA-C      . latanoprost (XALATAN) 0.005 % ophthalmic solution 1 drop  1 drop Both Eyes QHS Rhyne, Samantha J, PA-C   1 drop at 07/04/18 2131  . losartan (COZAAR) tablet 100 mg  100 mg Oral Q1400 Rhyne, Samantha J, PA-C   100 mg at 07/04/18 1826  . metoprolol tartrate (LOPRESSOR) injection 2-5 mg  2-5 mg Intravenous Q2H PRN Rhyne, Samantha J, PA-C      . morphine 2 MG/ML injection 2 mg  2 mg Intravenous Q2H PRN Rhyne, Hulen Shouts, PA-C   2 mg at 07/04/18 2126  . ondansetron (ZOFRAN) tablet 4 mg  4 mg Oral Q6H PRN Rhyne, Samantha J,  PA-C       Or  . ondansetron (ZOFRAN) injection 4 mg  4 mg Intravenous Q6H PRN Rhyne, Samantha J, PA-C      . oxyCODONE-acetaminophen (PERCOCET/ROXICET) 5-325 MG per tablet 1-2 tablet  1-2 tablet Oral Q4H PRN Rhyne, Hulen Shouts, PA-C   2 tablet at 07/05/18 0524  . pantoprazole (PROTONIX) EC tablet 40 mg  40 mg Oral Daily Rhyne, Hulen Shouts, PA-C   40 mg at 07/05/18 0911  . phenol (CHLORASEPTIC) mouth spray 1 spray  1 spray Mouth/Throat PRN Rhyne, Samantha J, PA-C      . pravastatin (PRAVACHOL) tablet 40 mg  40 mg Oral QHS Rhyne, Samantha J, PA-C   40 mg at 07/04/18 2124  . pregabalin (LYRICA) capsule 50-100 mg  50-100 mg Oral QHS Rhyne, Samantha J, PA-C   50 mg at 07/04/18 2122  . Rivaroxaban (XARELTO) tablet 15 mg  15 mg Oral BID WC Kris Mouton, RPH   15 mg at 07/05/18 9532   Followed by  . [START ON 07/25/2018] Rivaroxaban (XARELTO) tablet 15 mg  15 mg Oral Q supper Kris Mouton, [facility]      . sodium chloride flush (NS) 0.9 % injection 3 mL  3 mL Intravenous Q12H Rhyne, Samantha J, PA-C   3 mL at 07/04/18 2126  . triamterene-hydrochlorothiazide (MAXZIDE-25) 37.5-25 MG per tablet 1 tablet  1 tablet Oral Q1400 Rhyne, Hulen Shouts, PA-C   1 tablet at 07/04/18 1826     Discharge Medications: Please see discharge summary for a list of discharge medications.  Relevant Imaging Results:  Relevant Lab Results:   Additional Information SSN [id number]   Barbette Or, [place]

## 2018-07-05 NOTE — Progress Notes (Addendum)
  Progress Note    07/05/2018 8:57 AM 4 Days Post-Op  Subjective:  No new complaints   Vitals:   07/05/18 0314 07/05/18 0824  BP: 122/78 117/79  Pulse: 66 73  Resp: (!) 22 (!) 26  Temp: 98.6 F (37 C) 98.8 F (37.1 C)  SpO2: 98% 98%   Physical Exam: Lungs:  Non labored Incisions:  Some serous collection on dressing over medial popliteal incision; vein harvest incisions c/d/i Extremities:  Cracking skin L lower leg; palpable L DP; wound vac with good seal L foot Abdomen:  Soft Neurologic: A&O  CBC    Component Value Date/Time   WBC 11.7 (H) 07/04/2018 0347   RBC 3.06 (L) 07/04/2018 0347   HGB 7.8 (L) 07/05/2018 0307   HCT 24.5 (L) 07/05/2018 0307   PLT 228 07/04/2018 0347   MCV 82.4 07/04/2018 0347   MCH 26.8 07/04/2018 0347   MCHC 32.5 07/04/2018 0347   RDW 19.2 (H) 07/04/2018 0347   LYMPHSABS 0.6 (L) 06/27/2018 1045   MONOABS 0.9 06/27/2018 1045   EOSABS 0.1 06/27/2018 1045   BASOSABS 0.0 06/27/2018 1045    BMET    Component Value Date/Time   NA 134 (L) 07/05/2018 0307   K 3.8 07/05/2018 0307   CL 104 07/05/2018 0307   CO2 21 (L) 07/05/2018 0307   GLUCOSE 177 (H) 07/05/2018 0307   BUN 19 07/05/2018 0307   CREATININE 1.29 (H) 07/05/2018 0307   CALCIUM 8.4 (L) 07/05/2018 0307   GFRNONAA 57 (L) 07/05/2018 0307   GFRAA >60 07/05/2018 0307    INR    Component Value Date/Time   INR 0.93 12/01/2012 1908     Intake/Output Summary (Last 24 hours) at 07/05/2018 0857 Last data filed at 07/05/2018 0219 Gross per 24 hour  Intake 493 ml  Output 2075 ml  Net -1582 ml     Assessment/Plan:  68 y.o. male is s/p   4 Days Post-Op  1.  Harvest left greater saphenous vein 2.  Left peroneal artery thrombectomy 3.  Left SFA to peroneal artery bypass with non-reversed greater saphenous vein 4.  Left lower extremity angiogram 5.  Left SFA to anterior tibial artery bypass with ipsilateral non-reversed greater saphenous vein 6.  Amputation left toes 2 through  5   Perfusing L foot well with palpable DP Wound vac with good seal; next change on Monday Dry dressing changes prn for serous drainage Case manager to arrange portable wound vac Dispo: SNF vs [facility]     Dagoberto Ligas, PA-C [facility] [phone] 07/05/2018 8:57 AM  I have seen and evaluated the patient. I agree with the PA note as documented above. Palpable left AT.  Wound vac to foot with good seal.   Marty Heck, MD [facility] [place] Office: [phone] Pager: [phone]

## 2018-07-06 LAB — BASIC METABOLIC PANEL
Anion gap: 9 (ref 5–15)
BUN: 16 mg/dL (ref 8–23)
CO2: 23 mmol/L (ref 22–32)
Calcium: 8.7 mg/dL — ABNORMAL LOW (ref 8.9–10.3)
Chloride: 103 mmol/L (ref 98–111)
Creatinine, Ser: 1.33 mg/dL — ABNORMAL HIGH (ref 0.61–1.24)
GFR calc Af Amer: >60 mL/min (ref >60)
GFR calc non Af Amer: 55 mL/min — ABNORMAL LOW (ref >60)
Glucose, Bld: 137 mg/dL — ABNORMAL HIGH (ref 70–99)
Potassium: 3.7 mmol/L (ref 3.5–5.1)
Sodium: 135 mmol/L (ref 135–145)

## 2018-07-06 LAB — GLUCOSE, CAPILLARY
Glucose-Capillary: 135 mg/dL — ABNORMAL HIGH (ref 70–99)
Glucose-Capillary: 252 mg/dL — ABNORMAL HIGH (ref 70–99)
Glucose-Capillary: 179 mg/dL — ABNORMAL HIGH (ref 70–99)
Glucose-Capillary: 156 mg/dL — ABNORMAL HIGH (ref 70–99)

## 2018-07-06 LAB — HEMOGLOBIN AND HEMATOCRIT, BLOOD
HCT: 25.5 % — ABNORMAL LOW (ref 39.0–52.0)
Hemoglobin: 8.2 g/dL — ABNORMAL LOW (ref 13.0–17.0)

## 2018-07-06 LAB — MAGNESIUM: Magnesium: 2 mg/dL (ref 1.7–2.4)

## 2018-07-06 NOTE — Progress Notes (Signed)
PROGRESS NOTE    [name]  HWE:[id number] DOB: 1950-07-12 DOA: 06/27/2018 PCP: [name], MD   Brief Narrative:  HPI on 06/27/2018 by Dr. Myrene Buddy [name] is a 68 y.o. 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  The patient was treated for restenosis of his left SFA in Oct complicated by in stent thrombosis and requiring 2 repeat angiograms for stenting. 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  Since then, he seemed to be improving, was ambulating but not back to work until about one week prior to admission when he started to have new pain, redness and swelling of his left foot.   Two days ago he was seen in wound care, had a new ulcer on the left THIRD digit, was started on Levaquin which he has taken.  Then today, this third digit was completely purple, so he was sent from wound care center for IV antibiotics.  He has had no fever, chills, vomiting, LOC, confusion.    Interim history Patient admitted with gangrene of the left toes.  Vascular surgery consulted and appreciated.  Status post angiogram as well as amputation of the left toes #2 through 5, left SFA to peroneal artery bypass, left SFA to anterior tibial artery bypass. 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 -Patient with critical left lower extremity ischemia with necrotic toes on admission -MRI of the left foot showed osteonecrosis of the distal phalanx of the little toe, marrow edema in the proximal phalanx of the fifth toe consistent with osteomyelitis, bony destructive changes of the tuft of the third toe consistent with osteomyelitis, soft tissue gas about the distal phalanx of the third toe consistent with cellulitis -Vascular surgery consulted and appreciated -S/post  angiogram on 06/30/2018, amputation of left toes 2 through 5, left SFA to peroneal artery bypass, left SFA to anterior tibial artery bypass, angiogram -Vancomycin and Zosyn discontinued -Was transfused 2 units PRBC -PT/OT recommending SNF with wheelchair -Social work consulted -wound vac in place, vascular planning for replacement on 07/07/2018 -Patient to follow up with vascular surgery, Dr. Donzetta Matters in 2 weeks  Diabetes mellitus, type II -Hemoglobin A1c 6.5 -Continue Lantus, insulin sliding scale and CBG monitoring -Diabetes coordinator consulted  Left popliteal vein DVT -Lower extremity Doppler on 06/28/2018 showed left popliteal vein DVT -Xarelto currently held per vascular surgery -Continue Xarelto  Essential hypertension -Continue Coreg, Cozaar, triamterene/HCTZ, IV hydralazine PRN  Gout -Stable, continue allopurinol, colchicine  Chronic anemia -Baseline hemoglobin approximately 8, dropped to 7 -Patient was given 2 units PRBC, currently hemoglobin 8.2 -anemia panel showed an iron of 15, ferritin 179 -Patient given dose of Feraheme on 07/04/2018 -Continue to monitor CBC  CKD, stage III -Creatinine appears to be at baseline (1.5-1.7) -Continue to monitor BMP  DVT Prophylaxis Xarelto  Code Status: Full  Family Communication: None at bedside  Disposition Plan: Admitted. Pending SNF placement-suspect on 07/07/2018  Consultants Vascular surgery  Procedures  Procedure Performed: 06/30/2018 1.Ultrasound-guided access of the right common femoral artery 2.Aortogram 3.Left lower extremity arteriogram with selection of second order branches 4.45 minutesofmonitored moderate conscious sedation time 5.Mynx closure of the right common femoral artery  Antibiotics   Anti-infectives (From admission, onward)   Start     Dose/Rate Route Frequency Ordered Stop  07/02/18 1000  vancomycin (VANCOCIN) 1,500 mg in sodium chloride 0.9 % 500 mL IVPB     1,500 mg 250 mL/hr over 120  Minutes Intravenous Every 24 hours 07/01/18 2026 07/02/18 1057   07/01/18 0900  vancomycin (VANCOCIN) 1,500 mg in sodium chloride 0.9 % 500 mL IVPB     1,500 mg 250 mL/hr over 120 Minutes Intravenous To [facility] 07/01/18 0855 07/01/18 1023   06/29/18 0900  vancomycin (VANCOCIN) 1,500 mg in sodium chloride 0.9 % 500 mL IVPB  Status:  Discontinued     1,500 mg 250 mL/hr over 120 Minutes Intravenous Every 24 hours 06/28/18 1443 07/01/18 0855   06/28/18 1500  metroNIDAZOLE (FLAGYL) IVPB 500 mg     500 mg 100 mL/hr over 60 Minutes Intravenous Every 8 hours 06/28/18 1443 07/02/18 2359   06/28/18 1500  cefTRIAXone (ROCEPHIN) 2 g in sodium chloride 0.9 % 100 mL IVPB     2 g 200 mL/hr over 30 Minutes Intravenous Every 24 hours 06/28/18 1443 07/02/18 2359   06/28/18 1000  vancomycin (VANCOCIN) 1,500 mg in sodium chloride 0.9 % 500 mL IVPB  Status:  Discontinued     1,500 mg 250 mL/hr over 120 Minutes Intravenous Every 24 hours 06/27/18 1536 06/28/18 1435   06/27/18 1500  cefTRIAXone (ROCEPHIN) 2 g in sodium chloride 0.9 % 100 mL IVPB  Status:  Discontinued     2 g 200 mL/hr over 30 Minutes Intravenous Every 24 hours 06/27/18 1414 06/28/18 1435   06/27/18 1500  metroNIDAZOLE (FLAGYL) IVPB 500 mg  Status:  Discontinued     500 mg 100 mL/hr over 60 Minutes Intravenous Every 8 hours 06/27/18 1414 06/28/18 1435   06/27/18 1015  vancomycin (VANCOCIN) 2,000 mg in sodium chloride 0.9 % 500 mL IVPB     2,000 mg 250 mL/hr over 120 Minutes Intravenous  Once 06/27/18 1013 06/27/18 1247      Subjective:   [name] seen and examined today.  Patient has no complaints. States he can move his foot more and ready to go get up and move around. States the vac has been beeping. Denies current chest pain, shortness of breath, abdominal pain, N/V/D/C, dizziness, headache.   Objective:   Vitals:   07/05/18 2025 07/05/18 2359 07/06/18 0122 07/06/18 0621  BP: 128/78 101/81 124/82 134/81  Pulse:  68  71   Resp:  16  17  Temp: 98.7 F (37.1 C) 98.3 F (36.8 C)  98.9 F (37.2 C)  TempSrc: Oral Oral  Oral  SpO2:  99%  97%  Weight:      Height:        Intake/Output Summary (Last 24 hours) at 07/06/2018 9678 Last data filed at 07/06/2018 9381 Gross per 24 hour  Intake -  Output 1000 ml  Net -1000 ml   Filed Weights   06/27/18 0857  Weight: 104.3 kg   Exam  General: Well developed, well nourished, NAD, appears stated age  HEENT: NCAT, mucous membranes moist.   Neck: Supple  Cardiovascular: S1 S2 auscultated, RRR, no murmur  Respiratory: Clear to auscultation bilaterally with equal chest rise  Abdomen: Soft, nontender, nondistended, + bowel sounds  Extremities: warm dry without cyanosis clubbing. Left foot wound vac in place. Incisions clean, mild drainage.   Neuro: AAOx3, nonfocal  Psych: Pleasant, appropriate mood and affect   Data Reviewed: I have personally reviewed following labs and imaging studies  CBC: Recent Labs  Lab 06/30/18 0503 07/01/18 0243  07/02/18 0506 07/02/18  1935 07/03/18 0311 07/04/18 0347 07/05/18 0307 07/06/18 0320  WBC 9.9 10.6*  --  9.1  --  11.4* 11.7*  --   --   HGB 8.2* 8.6*   < > 7.0* 8.4* 8.3* 8.2* 7.8* 8.2*  HCT 26.6* 27.3*   < > 20.9* 26.1* 25.4* 25.2* 24.5* 25.5*  MCV 74.3* 74.8*  --  78.9*  --  80.6 82.4  --   --   PLT 314 345  --  248  --  212 228  --   --    < > = values in this interval not displayed.   Basic Metabolic Panel: Recent Labs  Lab 07/01/18 0243  07/01/18 1556 07/02/18 0506 07/03/18 0311 07/04/18 0347 07/05/18 0307  NA 136   < > 136 136 137 133* 134*  K 3.9   < > 4.9 4.5 4.1 3.9 3.8  CL 102  --   --  109 108 105 104  CO2 24  --   --  21* 21* 21* 21*  GLUCOSE 180*  --   --  219* 152* 168* 177*  BUN 15  --   --  15 20 19 19   CREATININE 1.23  --   --  1.23 1.50* 1.44* 1.29*  CALCIUM 8.9  --   --  8.2* 7.9* 8.2* 8.4*   < > = values in this interval not displayed.   GFR: Estimated Creatinine  Clearance: 70.5 mL/min (A) (by C-G formula based on SCr of 1.29 mg/dL (H)). Liver Function Tests: No results for input(s): AST, ALT, ALKPHOS, BILITOT, PROT, ALBUMIN in the last 168 hours. No results for input(s): LIPASE, AMYLASE in the last 168 hours. No results for input(s): AMMONIA in the last 168 hours. Coagulation Profile: No results for input(s): INR, PROTIME in the last 168 hours. Cardiac Enzymes: No results for input(s): CKTOTAL, CKMB, CKMBINDEX, TROPONINI in the last 168 hours. BNP (last 3 results) No results for input(s): PROBNP in the last 8760 hours. HbA1C: No results for input(s): HGBA1C in the last 72 hours. CBG: Recent Labs  Lab 07/05/18 0642 07/05/18 1159 07/05/18 1638 07/05/18 2107 07/06/18 0624  GLUCAP 148* 148* 162* 158* 135*   Lipid Profile: No results for input(s): CHOL, HDL, LDLCALC, TRIG, CHOLHDL, LDLDIRECT in the last 72 hours. Thyroid Function Tests: No results for input(s): TSH, T4TOTAL, FREET4, T3FREE, THYROIDAB in the last 72 hours. Anemia Panel: No results for input(s): VITAMINB12, FOLATE, FERRITIN, TIBC, IRON, RETICCTPCT in the last 72 hours. Urine analysis:    Component Value Date/Time   COLORURINE YELLOW 04/29/2018 0820   APPEARANCEUR CLEAR 04/29/2018 0820   LABSPEC 1.013 04/29/2018 0820   PHURINE 5.0 04/29/2018 0820   GLUCOSEU 50 (A) 04/29/2018 0820   HGBUR NEGATIVE 04/29/2018 0820   BILIRUBINUR NEGATIVE 04/29/2018 0820   KETONESUR NEGATIVE 04/29/2018 0820   PROTEINUR NEGATIVE 04/29/2018 0820   UROBILINOGEN 0.2 12/01/2012 2135   NITRITE NEGATIVE 04/29/2018 0820   LEUKOCYTESUR NEGATIVE 04/29/2018 0820   Sepsis Labs: @LABRCNTIP (procalcitonin:4,lacticidven:4)  ) Recent Results (from the past 240 hour(s))  MRSA PCR Screening     Status: Abnormal   Collection Time: 06/27/18  4:06 PM  Result Value Ref Range Status   MRSA by PCR POSITIVE (A) NEGATIVE Final    Comment:        The GeneXpert MRSA Assay (FDA approved for NASAL  specimens only), is one component of a comprehensive MRSA colonization surveillance program. It is not intended to diagnose MRSA infection nor to guide or monitor  treatment for MRSA infections. RESULT CALLED TO, READ BACK BY AND VERIFIED WITHConception Chancy RN [phone] 1902 BY GF Performed at [facility], [place] [address] [place], [place] [address]       Radiology Studies: No results found.   Scheduled Meds: . sodium chloride   Intravenous Once  . sodium chloride   Intravenous Once  . sodium chloride   Intravenous Once  . allopurinol  100 mg Oral Daily  . carvedilol  12.5 mg Oral Q1400  . clopidogrel  75 mg Oral Daily  . colchicine  0.6 mg Oral BID  . docusate sodium  100 mg Oral Daily  . dorzolamide-timolol  1 drop Both Eyes BID  . feeding supplement (GLUCERNA SHAKE)  237 mL Oral BID BM  . feeding supplement (PRO-STAT SUGAR FREE 64)  30 mL Oral Daily  . insulin aspart  0-15 Units Subcutaneous TID WC  . insulin aspart  0-5 Units Subcutaneous QHS  . insulin aspart  4 Units Subcutaneous TID WC  . insulin glargine  20 Units Subcutaneous QHS  . latanoprost  1 drop Both Eyes QHS  . losartan  100 mg Oral Q1400  . pantoprazole  40 mg Oral Daily  . pravastatin  40 mg Oral QHS  . pregabalin  50-100 mg Oral QHS  . [START ON 07/25/2018] rivaroxaban  20 mg Oral Q supper   Followed by  . rivaroxaban  15 mg Oral BID WC  . sodium chloride flush  3 mL Intravenous Q12H  . triamterene-hydrochlorothiazide  1 tablet Oral Q1400   Continuous Infusions:    LOS: 9 days   Time Spent in minutes   30 minutes  Mineola Duan D.O. on 07/06/2018 at 7:21 AM  Between 7am to 7pm - Please see pager noted on [email or web address]  After 7pm go to [email or web address]  And look for the night coverage person covering for me after hours  [facility] Office  [phone]

## 2018-07-06 NOTE — Progress Notes (Addendum)
  Progress Note    07/06/2018 8:27 AM 5 Days Post-Op  Subjective:  Wound vac was beeping most of the night   Vitals:   07/06/18 0122 07/06/18 0621  BP: 124/82 134/81  Pulse:  71  Resp:  17  Temp:  98.9 F (37.2 C)  SpO2:  97%   Physical Exam: Lungs:  Non labored Incisions:  Upper LLE incisions c/d/i; scant serosanguinous drainage from medial lower leg incision; wound vac without seal Extremities:  Palpable L ATA Abdomen:  Soft Neurologic: A&O  CBC    Component Value Date/Time   WBC 11.7 (H) 07/04/2018 0347   RBC 3.06 (L) 07/04/2018 0347   HGB 8.2 (L) 07/06/2018 0320   HCT 25.5 (L) 07/06/2018 0320   PLT 228 07/04/2018 0347   MCV 82.4 07/04/2018 0347   MCH 26.8 07/04/2018 0347   MCHC 32.5 07/04/2018 0347   RDW 19.2 (H) 07/04/2018 0347   LYMPHSABS 0.6 (L) 06/27/2018 1045   MONOABS 0.9 06/27/2018 1045   EOSABS 0.1 06/27/2018 1045   BASOSABS 0.0 06/27/2018 1045    BMET    Component Value Date/Time   NA 135 07/06/2018 0652   K 3.7 07/06/2018 0652   CL 103 07/06/2018 0652   CO2 23 07/06/2018 0652   GLUCOSE 137 (H) 07/06/2018 0652   BUN 16 07/06/2018 0652   CREATININE 1.33 (H) 07/06/2018 0652   CALCIUM 8.7 (L) 07/06/2018 0652   GFRNONAA 55 (L) 07/06/2018 0652   GFRAA >60 07/06/2018 0652    INR    Component Value Date/Time   INR 0.93 12/01/2012 1908     Intake/Output Summary (Last 24 hours) at 07/06/2018 0827 Last data filed at 07/06/2018 0740 Gross per 24 hour  Intake -  Output 1600 ml  Net -1600 ml     Assessment/Plan:  68 y.o. male is s/p 1.Harvest left greater saphenous vein 2.Left peroneal artery thrombectomy 3.Left SFA to peroneal artery bypass with non-reversed greater saphenous vein 4.Left lower extremity angiogram 5.Left SFA to anterior tibial artery bypass with ipsilateral non-reversed greater saphenous vein 6. Amputation left toes 2 through 5  5 Days Post-Op   Perfusing LLE well with palpable L ATA Tape added to achieve a  seal; vac change tomorrow Case manager consulted for home vac   Dagoberto Ligas, [place] [facility] [phone] 07/06/2018 8:27 AM  I have seen and evaluated the patient. I agree with the PA note as documented above. Palpable AT in left foot.  Vac change tomorrow by wound nurse.  Overall appears to be doing well.    Marty Heck, MD [facility] [place] Office: [phone] Pager: [phone]

## 2018-07-06 NOTE — Clinical Social Work Note (Signed)
Clinical Social Work Assessment  Patient Details  Name: JAVAUN DIMPERIO MRN: [id number] Date of Birth: 1951/02/26  Date of referral:  07/06/18               Reason for consult:  Facility Placement                Permission sought to share information with:  Family Supports Permission granted to share information::  Yes, Verbal Permission Granted  Name::     Fady Stamps  Agency::     Relationship::  Spouse  Contact Information:  [phone]  Housing/Transportation Living arrangements for the past 2 months:  [place], [place] of Information:  Patient Patient Interpreter Needed:  None Criminal Activity/Legal Involvement Pertinent to Current Situation/Hospitalization:  No - Comment as needed Significant Relationships:  Adult Children, Spouse Lives with:  Spouse Do you feel safe going back to the place where you live?  Yes Need for family participation in patient care:  Yes (Comment)  Care giving concerns:  Patient with no family/friends at bedside, however states that his spouse and children are on board with plans to go to ST-SNF as he has done in the past.   Social Worker assessment / plan:  Clinical Social Worker met with patient at bedside to offer support and discuss patient needs at discharge.  Patient states that he was at [facility] following a stent placement around Halloween/Thanksgiving.  Patient had been home for about 30 days and then returned to the hospital due to infection, leading to toe amputations and a wound vac.  Patient will now need ST-SNF placement again and is hopeful for [facility].  CSW left message with admissions coordinator to determine bed availability and to initiate insurance authorization pending bed availability - await return call.  Patient is also agreeable to [facility] if [facility] is unable to accept patient or has no bed availability.  Patient mother in law has been to [facility] and he is familiar.  CSW initiated  referral and will follow up regarding available bed offers.  CSW remains available for support and to facilitate patient discharge needs once medically stable and bed available.  Employment status:  [employer] information:  Other (Comment Required), Medicare PT Recommendations:  [place] / Referral to community resources:  [place]  Patient/Family's Response to care:  Patient agreeable with SNF placement and hopeful for return to [facility].  Patient with good family support and verbalized understanding of CSW role in discharge planning.  Patient/Family's Understanding of and Emotional Response to Diagnosis, Current Treatment, and Prognosis:  Patient is realistic regarding current limitations and need for continued assistance and support.  Patient still works full time at [employer] and would like to return once he has completed rehab.  Emotional Assessment Appearance:  Appears stated age Attitude/Demeanor/Rapport:  Gracious, Engaged, Charismatic Affect (typically observed):  Accepting, Appropriate, Calm Orientation:  Oriented to Self, Oriented to Place, Oriented to  Time, Oriented to Situation Alcohol / Substance use:  Not Applicable Psych involvement (Current and /or in the community):  No (Comment)  Discharge Needs  Concerns to be addressed:  Financial / Insurance Concerns Readmission within the last 30 days:  Yes Current discharge risk:  Inadequate Financial Supports Barriers to Discharge:  S[employer] broker, Continued Medical Work up  [employer], [place]

## 2018-07-06 NOTE — Progress Notes (Signed)
Pt states he will place himself on CPAP when ready for bed.

## 2018-07-07 ENCOUNTER — Telehealth: Payer: Self-pay | Admitting: Vascular Surgery

## 2018-07-07 LAB — GLUCOSE, CAPILLARY
Glucose-Capillary: 133 mg/dL — ABNORMAL HIGH (ref 70–99)
Glucose-Capillary: 158 mg/dL — ABNORMAL HIGH (ref 70–99)
Glucose-Capillary: 125 mg/dL — ABNORMAL HIGH (ref 70–99)
Glucose-Capillary: 175 mg/dL — ABNORMAL HIGH (ref 70–99)

## 2018-07-07 LAB — HEMOGLOBIN AND HEMATOCRIT, BLOOD
HCT: 25.5 % — ABNORMAL LOW (ref 39.0–52.0)
Hemoglobin: 8.2 g/dL — ABNORMAL LOW (ref 13.0–17.0)

## 2018-07-07 NOTE — Telephone Encounter (Signed)
-----   Message from Gabriel Earing, [place] sent at 07/04/2018  8:34 AM EST ----- S/p left femoral to distal bypass and left toe amp.  Please have pt f/u in 2-3 weeks with Dr. Donzetta Matters.   Thanks

## 2018-07-07 NOTE — Progress Notes (Signed)
CSW contacted [facility] and [facility]- no beds available. Contacted patient's wife and she wants to try [facility]- CSW faxed out referral today.  [name], [place] Social Worker [phone]

## 2018-07-07 NOTE — Progress Notes (Addendum)
  Progress Note    07/07/2018 7:30 AM 6 Days Post-Op  Subjective:  Feels pretty good this morning; says he is able to stand, pivot and sit in chair.    Afebrile HR 60's-70's NSR [id number] systolic 030% RA  Vitals:   07/07/18 0016 07/07/18 0555  BP: 121/84 116/81  Pulse: 67 69  Resp: 19 (!) 21  Temp:  98 F (36.7 C)  SpO2: 99% 98%    Physical Exam: Cardiac:  regular Lungs:  Non labored Incisions:  Some serous drainage from the medial below knee incision from the distal portion.  Lateral incision is clean and dry Extremities:  Brisk left AT doppler signal; wound vac with good seal.   CBC    Component Value Date/Time   WBC 11.7 (H) 07/04/2018 0347   RBC 3.06 (L) 07/04/2018 0347   HGB 8.2 (L) 07/06/2018 0320   HCT 25.5 (L) 07/06/2018 0320   PLT 228 07/04/2018 0347   MCV 82.4 07/04/2018 0347   MCH 26.8 07/04/2018 0347   MCHC 32.5 07/04/2018 0347   RDW 19.2 (H) 07/04/2018 0347   LYMPHSABS 0.6 (L) 06/27/2018 1045   MONOABS 0.9 06/27/2018 1045   EOSABS 0.1 06/27/2018 1045   BASOSABS 0.0 06/27/2018 1045    BMET    Component Value Date/Time   NA 135 07/06/2018 0652   K 3.7 07/06/2018 0652   CL 103 07/06/2018 0652   CO2 23 07/06/2018 0652   GLUCOSE 137 (H) 07/06/2018 0652   BUN 16 07/06/2018 0652   CREATININE 1.33 (H) 07/06/2018 0652   CALCIUM 8.7 (L) 07/06/2018 0652   GFRNONAA 55 (L) 07/06/2018 0652   GFRAA >60 07/06/2018 0652    INR    Component Value Date/Time   INR 0.93 12/01/2012 1908     Intake/Output Summary (Last 24 hours) at 07/07/2018 0730 Last data filed at 07/07/2018 0600 Gross per 24 hour  Intake 1230 ml  Output 2725 ml  Net -1495 ml     Assessment:  68 y.o. male is s/p:   1.Harvest left greater saphenous vein 2.Left peroneal artery thrombectomy 3.Left SFA to peroneal artery bypass with non-reversed greater saphenous vein 4.Left lower extremity angiogram 5.Left SFA to anterior tibial artery bypass with ipsilateral  non-reversed greater saphenous vein 6. Amputation left toes 2 through 5   6 Days Post-Op  Plan: -pt with brisk left AT doppler signal -wound vac with good seal-wound vac change per WOC today -PT to walk with Darco shoe -DVT prophylaxis:  Xarelto -ok for discharge from vascular standpoint.  Since going to SNF, may not be able to get portable vac machine as they typically use there own.   Leontine Locket, PA-C [facility] [phone] 07/07/2018 7:30 AM  I have independently interviewed and examined patient and agree with PA assessment and plan above.  Palpable anterior tibial pulse distally.  Wound VAC is to suction will be changed today.  Lyncoln Maskell C. Donzetta Matters, MD [facility] [place] Office: [phone] Pager: [phone]

## 2018-07-07 NOTE — Progress Notes (Signed)
PROGRESS NOTE    [name]  XVQ:[id number] DOB: 08/29/1950 DOA: 06/27/2018 PCP: [name], MD   Brief Narrative:  HPI on 06/27/2018 by Dr. Myrene Buddy [name] is a 68 y.o. 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  The patient was treated for restenosis of his left SFA in Oct complicated by in stent thrombosis and requiring 2 repeat angiograms for stenting. 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  Since then, he seemed to be improving, was ambulating but not back to work until about one week prior to admission when he started to have new pain, redness and swelling of his left foot.   Two days ago he was seen in wound care, had a new ulcer on the left THIRD digit, was started on Levaquin which he has taken.  Then today, this third digit was completely purple, so he was sent from wound care center for IV antibiotics.  He has had no fever, chills, vomiting, LOC, confusion.    Interim history Patient admitted with gangrene of the left toes.  Vascular surgery consulted and appreciated.  Status post angiogram as well as amputation of the left toes #2 through 5, left SFA to peroneal artery bypass, left SFA to anterior tibial artery bypass. 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 -Patient with critical left lower extremity ischemia with necrotic toes on admission -MRI of the left foot showed osteonecrosis of the distal phalanx of the little toe, marrow edema in the proximal phalanx of the fifth toe consistent with osteomyelitis, bony destructive changes of the tuft of the third toe consistent with osteomyelitis, soft tissue gas about the distal phalanx of the third toe consistent with cellulitis -Vascular surgery consulted and appreciated -S/post  angiogram on 06/30/2018, amputation of left toes 2 through 5, left SFA to peroneal artery bypass, left SFA to anterior tibial artery bypass, angiogram -Vancomycin and Zosyn discontinued -Was transfused 2 units PRBC -PT/OT recommending SNF with wheelchair -Social work consulted -wound vac in place, wound VAC to be changed today -Patient to follow up with vascular surgery, Dr. Donzetta Matters in 2 weeks  Diabetes mellitus, type II -Hemoglobin A1c 6.5 -Continue Lantus, insulin sliding scale and CBG monitoring -Diabetes coordinator consulted  Left popliteal vein DVT -Lower extremity Doppler on 06/28/2018 showed left popliteal vein DVT -Xarelto currently held per vascular surgery -Continue Xarelto  Essential hypertension -Continue Coreg, Cozaar, triamterene/HCTZ, IV hydralazine PRN  Gout -Stable, continue allopurinol, colchicine  Chronic anemia -Baseline hemoglobin approximately 8, dropped to 7 -Patient was given 2 units PRBC, currently hemoglobin 8.2 -anemia panel showed an iron of 15, ferritin 179 -Patient given dose of Feraheme on 07/04/2018 -Continue to monitor CBC  CKD, stage III -Creatinine appears to be at baseline (1.5-1.7) -Continue to monitor BMP  DVT Prophylaxis Xarelto  Code Status: Full  Family Communication: None at bedside  Disposition Plan: Admitted. Pending SNF placement/insurance authorization.  Requiring SNF given his recent extensive surgery as well as toe amputations.  Have poor standing balance and having to rely heavily on rolling walker.  Consultants Vascular surgery  Procedures  Procedure Performed: 06/30/2018 1.Ultrasound-guided access of the right common femoral artery 2.Aortogram 3.Left lower extremity arteriogram with selection of second order branches 4.45 minutesofmonitored moderate conscious sedation time 5.Mynx closure of  the right common femoral artery  Antibiotics   Anti-infectives (From admission, onward)   Start     Dose/Rate Route  Frequency Ordered Stop   07/02/18 1000  vancomycin (VANCOCIN) 1,500 mg in sodium chloride 0.9 % 500 mL IVPB     1,500 mg 250 mL/hr over 120 Minutes Intravenous Every 24 hours 07/01/18 2026 07/02/18 1057   07/01/18 0900  vancomycin (VANCOCIN) 1,500 mg in sodium chloride 0.9 % 500 mL IVPB     1,500 mg 250 mL/hr over 120 Minutes Intravenous To [facility] 07/01/18 0855 07/01/18 1023   06/29/18 0900  vancomycin (VANCOCIN) 1,500 mg in sodium chloride 0.9 % 500 mL IVPB  Status:  Discontinued     1,500 mg 250 mL/hr over 120 Minutes Intravenous Every 24 hours 06/28/18 1443 07/01/18 0855   06/28/18 1500  metroNIDAZOLE (FLAGYL) IVPB 500 mg     500 mg 100 mL/hr over 60 Minutes Intravenous Every 8 hours 06/28/18 1443 07/02/18 2359   06/28/18 1500  cefTRIAXone (ROCEPHIN) 2 g in sodium chloride 0.9 % 100 mL IVPB     2 g 200 mL/hr over 30 Minutes Intravenous Every 24 hours 06/28/18 1443 07/02/18 2359   06/28/18 1000  vancomycin (VANCOCIN) 1,500 mg in sodium chloride 0.9 % 500 mL IVPB  Status:  Discontinued     1,500 mg 250 mL/hr over 120 Minutes Intravenous Every 24 hours 06/27/18 1536 06/28/18 1435   06/27/18 1500  cefTRIAXone (ROCEPHIN) 2 g in sodium chloride 0.9 % 100 mL IVPB  Status:  Discontinued     2 g 200 mL/hr over 30 Minutes Intravenous Every 24 hours 06/27/18 1414 06/28/18 1435   06/27/18 1500  metroNIDAZOLE (FLAGYL) IVPB 500 mg  Status:  Discontinued     500 mg 100 mL/hr over 60 Minutes Intravenous Every 8 hours 06/27/18 1414 06/28/18 1435   06/27/18 1015  vancomycin (VANCOCIN) 2,000 mg in sodium chloride 0.9 % 500 mL IVPB     2,000 mg 250 mL/hr over 120 Minutes Intravenous  Once 06/27/18 1013 06/27/18 1247      Subjective:   [name] seen and examined today.  Has no complaints this morning.  Denies current chest pain, shortness of breath, abdominal pain, nausea or vomiting, diarrhea or constipation, dizziness or headache.  Able to move his foot around more today.     Objective:   Vitals:   07/07/18 0014 07/07/18 0016 07/07/18 0555 07/07/18 0738  BP: 121/84 121/84 116/81 121/78  Pulse:  67 69 68  Resp:  19 (!) 21 11  Temp: 98.7 F (37.1 C)  98 F (36.7 C)   TempSrc: Axillary  Oral   SpO2:  99% 98% 100%  Weight:      Height:        Intake/Output Summary (Last 24 hours) at 07/07/2018 1221 Last data filed at 07/07/2018 0600 Gross per 24 hour  Intake 750 ml  Output 1725 ml  Net -975 ml   Filed Weights   06/27/18 0857  Weight: 104.3 kg   Exam  General: Well developed, well nourished, NAD, appears stated age  78: NCAT, mucous membranes moist.   Neck: Supple  Cardiovascular: S1 S2 auscultated, no rubs, murmurs or gallops. Regular rate and rhythm.  Respiratory: Clear to auscultation bilaterally with equal chest rise  Abdomen: Soft, nontender, nondistended, + bowel sounds  Extremities: warm dry without cyanosis clubbing.  Trace lower extremity edema.  Left foot wound VAC in place.  Incisions clean.  Neuro: AAOx3, nonfocal  Psych:  Pleasant, appropriate mood and affect   Data Reviewed: I have personally reviewed following labs and imaging studies  CBC: Recent Labs  Lab 07/01/18 0243  07/02/18 0506  07/03/18 0311 07/04/18 0347 07/05/18 0307 07/06/18 0320 07/07/18 0652  WBC 10.6*  --  9.1  --  11.4* 11.7*  --   --   --   HGB 8.6*   < > 7.0*   < > 8.3* 8.2* 7.8* 8.2* 8.2*  HCT 27.3*   < > 20.9*   < > 25.4* 25.2* 24.5* 25.5* 25.5*  MCV 74.8*  --  78.9*  --  80.6 82.4  --   --   --   PLT 345  --  248  --  212 228  --   --   --    < > = values in this interval not displayed.   Basic Metabolic Panel: Recent Labs  Lab 07/02/18 0506 07/03/18 0311 07/04/18 0347 07/05/18 0307 07/06/18 0652  NA 136 137 133* 134* 135  K 4.5 4.1 3.9 3.8 3.7  CL 109 108 105 104 103  CO2 21* 21* 21* 21* 23  GLUCOSE 219* 152* 168* 177* 137*  BUN 15 20 19 19 16   CREATININE 1.23 1.50* 1.44* 1.29* 1.33*  CALCIUM 8.2* 7.9* 8.2* 8.4* 8.7*  MG   --   --   --   --  2.0   GFR: Estimated Creatinine Clearance: 68.4 mL/min (A) (by C-G formula based on SCr of 1.33 mg/dL (H)). Liver Function Tests: No results for input(s): AST, ALT, ALKPHOS, BILITOT, PROT, ALBUMIN in the last 168 hours. No results for input(s): LIPASE, AMYLASE in the last 168 hours. No results for input(s): AMMONIA in the last 168 hours. Coagulation Profile: No results for input(s): INR, PROTIME in the last 168 hours. Cardiac Enzymes: No results for input(s): CKTOTAL, CKMB, CKMBINDEX, TROPONINI in the last 168 hours. BNP (last 3 results) No results for input(s): PROBNP in the last 8760 hours. HbA1C: No results for input(s): HGBA1C in the last 72 hours. CBG: Recent Labs  Lab 07/06/18 1205 07/06/18 1615 07/06/18 2123 07/07/18 0558 07/07/18 1136  GLUCAP 252* 179* 156* 133* 158*   Lipid Profile: No results for input(s): CHOL, HDL, LDLCALC, TRIG, CHOLHDL, LDLDIRECT in the last 72 hours. Thyroid Function Tests: No results for input(s): TSH, T4TOTAL, FREET4, T3FREE, THYROIDAB in the last 72 hours. Anemia Panel: No results for input(s): VITAMINB12, FOLATE, FERRITIN, TIBC, IRON, RETICCTPCT in the last 72 hours. Urine analysis:    Component Value Date/Time   COLORURINE YELLOW 04/29/2018 0820   APPEARANCEUR CLEAR 04/29/2018 0820   LABSPEC 1.013 04/29/2018 0820   PHURINE 5.0 04/29/2018 0820   GLUCOSEU 50 (A) 04/29/2018 0820   HGBUR NEGATIVE 04/29/2018 0820   BILIRUBINUR NEGATIVE 04/29/2018 0820   KETONESUR NEGATIVE 04/29/2018 0820   PROTEINUR NEGATIVE 04/29/2018 0820   UROBILINOGEN 0.2 12/01/2012 2135   NITRITE NEGATIVE 04/29/2018 0820   LEUKOCYTESUR NEGATIVE 04/29/2018 0820   Sepsis Labs: @LABRCNTIP (procalcitonin:4,lacticidven:4)  ) Recent Results (from the past 240 hour(s))  MRSA PCR Screening     Status: Abnormal   Collection Time: 06/27/18  4:06 PM  Result Value Ref Range Status   MRSA by PCR POSITIVE (A) NEGATIVE Final    Comment:        The  GeneXpert MRSA Assay (FDA approved for NASAL specimens only), is one component of a comprehensive MRSA colonization surveillance program. It is not intended to diagnose MRSA infection nor to guide or monitor treatment for  MRSA infections. RESULT CALLED TO, READ BACK BY AND VERIFIED WITHConception Chancy RN [phone] BY GF Performed at [facility], [place] [address] [place], [place] [address]       Radiology Studies: No results found.   Scheduled Meds: . sodium chloride   Intravenous Once  . sodium chloride   Intravenous Once  . sodium chloride   Intravenous Once  . allopurinol  100 mg Oral Daily  . carvedilol  12.5 mg Oral Q1400  . clopidogrel  75 mg Oral Daily  . colchicine  0.6 mg Oral BID  . docusate sodium  100 mg Oral Daily  . dorzolamide-timolol  1 drop Both Eyes BID  . feeding supplement (GLUCERNA SHAKE)  237 mL Oral BID BM  . feeding supplement (PRO-STAT SUGAR FREE 64)  30 mL Oral Daily  . insulin aspart  0-15 Units Subcutaneous TID WC  . insulin aspart  0-5 Units Subcutaneous QHS  . insulin aspart  4 Units Subcutaneous TID WC  . insulin glargine  20 Units Subcutaneous QHS  . latanoprost  1 drop Both Eyes QHS  . losartan  100 mg Oral Q1400  . pantoprazole  40 mg Oral Daily  . pravastatin  40 mg Oral QHS  . pregabalin  50-100 mg Oral QHS  . [START ON 07/25/2018] rivaroxaban  20 mg Oral Q supper   Followed by  . rivaroxaban  15 mg Oral BID WC  . sodium chloride flush  3 mL Intravenous Q12H  . triamterene-hydrochlorothiazide  1 tablet Oral Q1400   Continuous Infusions:    LOS: 10 days   Time Spent in minutes   30 minutes  Marqui Formby D.O. on 07/07/2018 at 12:21 PM  Between 7am to 7pm - Please see pager noted on [email or web address]  After 7pm go to [email or web address]  And look for the night coverage person covering for me after hours  [facility] Office  [phone]

## 2018-07-07 NOTE — Progress Notes (Signed)
Physical Therapy Treatment Patient Details Name: [name] MRN: [id number] DOB: 06-Jan-1951 Today's Date: 07/07/2018    History of Present Illness Pt is a 68 y.o. M with significant PMH of gout, HTN, IDDM, CKD III, PVD s/p recent LLE stenting, who presents with left foot pain, redness and swelling. 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    PT Comments    Patient progressing well with mobility but continues to have some limitations in activity tolerance and overall functional status. Use of RW required. Current POC remains appropriate.    Follow Up Recommendations  SNF;Supervision for mobility/OOB     Equipment Recommendations  Wheelchair (measurements PT)    Recommendations for Other Services       Precautions / Restrictions Precautions Precautions: Fall Precaution Comments: wound vac Required Braces or Orthoses: Other Brace Other Brace: darco shoe Restrictions Weight Bearing Restrictions: Yes LLE Weight Bearing: Partial weight bearing LLE Partial Weight Bearing Percentage or Pounds: (Heel weightbearing)    Mobility  Bed Mobility Overal bed mobility: Needs Assistance Bed Mobility: Supine to Sit     Supine to sit: Supervision        Transfers Overall transfer level: Needs assistance Equipment used: Rolling walker (2 wheeled) Transfers: Sit to/from Stand Sit to Stand: Min assist         General transfer comment: min assist for stability when elevating to upright  Ambulation/Gait Ambulation/Gait assistance: Min guard Gait Distance (Feet): 80 Feet(x2 with standing rest breaks required) Assistive device: Rolling walker (2 wheeled) Gait Pattern/deviations: Trunk flexed Gait velocity: decreased Gait velocity interpretation: <1.31 ft/sec, indicative of household ambulator General Gait Details: Step to hopping with mobility, heavy relaince on RW for support, cues for  positinoing and PWBing restrictions   Stairs             Wheelchair Mobility    Modified Rankin (Stroke Patients Only)       Balance Overall balance assessment: Needs assistance   Sitting balance-Leahy Scale: Good     Standing balance support: Bilateral upper extremity supported Standing balance-Leahy Scale: Poor Standing balance comment: heavy reliance on RW                            Cognition Arousal/Alertness: Awake/alert Behavior During Therapy: WFL for tasks assessed/performed Overall Cognitive Status: Within Functional Limits for tasks assessed                                        Exercises      General Comments        Pertinent Vitals/Pain Pain Assessment: Faces Faces Pain Scale: Hurts even more Pain Location: groin and LE Pain Descriptors / Indicators: Burning;Tightness Pain Intervention(s): Monitored during session    Home Living                      Prior Function            PT Goals (current goals can now be found in the care plan section) Acute Rehab PT Goals Patient Stated Goal: "Move this left leg." PT Goal Formulation: With patient Time For Goal Achievement: 07/17/18 Potential to Achieve Goals: Good Progress towards PT goals: Progressing toward goals    Frequency    Min 3X/week  PT Plan Current plan remains appropriate    Co-evaluation              AM-PAC PT "6 Clicks" Mobility   Outcome Measure  Help needed turning from your back to your side while in a flat bed without using bedrails?: None Help needed moving from lying on your back to sitting on the side of a flat bed without using bedrails?: None Help needed moving to and from a bed to a chair (including a wheelchair)?: A Little Help needed standing up from a chair using your arms (e.g., wheelchair or bedside chair)?: A Lot Help needed to walk in hospital room?: A Lot Help needed climbing 3-5 steps with a railing? :  Total 6 Click Score: 16    End of Session Equipment Utilized During Treatment: Gait belt;Other (comment)(darco shoe) Activity Tolerance: Patient limited by pain Patient left: with call bell/phone within reach;in bed Nurse Communication: Mobility status PT Visit Diagnosis: Unsteadiness on feet (R26.81);Other abnormalities of gait and mobility (R26.89);Pain Pain - Right/Left: Left Pain - part of body: Leg     Time: 8115-7262 PT Time Calculation (min) (ACUTE ONLY): 24 min  Charges:  $Gait Training: 8-22 mins $Therapeutic Activity: 8-22 mins                     Alben Deeds, PT DPT  Board Certified Neurologic Specialist [place] Pager [phone] Office [place] 07/07/2018, 9:24 AM

## 2018-07-07 NOTE — Telephone Encounter (Signed)
sch appt lvm [facility] ltr 08/01/2018 845am p/o MD

## 2018-07-07 NOTE — Consult Note (Signed)
[facility] Nurse wound follow up Wound type: surgical  Measurement: 7cm x 4cm x 2cm  Wound bed: 80% pink, 20% yellow along the distal aspect of the wound bed. Fatty necrosis  Drainage (amount, consistency, odor) minimal in canister, serosanguinous  Periwound: intact, some maceration noted from 9 o clock -2 o'clock  Dressing procedure/placement/frequency: Removed old NPWT dressing Periwound around existing toe placed 1/2 of ostomy barrier ring to aid in seal Filled wound with ___1______ piece of black foam. Sealed NPWT dressing at 189mm HG/121mmHG  Patient received PO pain medication per bedside nurse prior to dressing change Patient tolerated procedure well   [place] nurse will continue to provide NPWT dressing changed due to the complexity of the dressing change. MWF   [place], [place], [place]

## 2018-07-08 LAB — GLUCOSE, CAPILLARY
Glucose-Capillary: 124 mg/dL — ABNORMAL HIGH (ref 70–99)
Glucose-Capillary: 183 mg/dL — ABNORMAL HIGH (ref 70–99)
Glucose-Capillary: 191 mg/dL — ABNORMAL HIGH (ref 70–99)
Glucose-Capillary: 176 mg/dL — ABNORMAL HIGH (ref 70–99)

## 2018-07-08 NOTE — Progress Notes (Addendum)
  Progress Note    07/08/2018 7:16 AM 7 Days Post-Op  Subjective:  No complaints  Tm 100 now 99.4 HR 60's-70's  16'X-096'E systolic 45% CPAP  Vitals:   07/08/18 0037 07/08/18 0539  BP: 96/83 125/87  Pulse: 62 68  Resp: 18 (!) 22  Temp: 98.1 F (36.7 C) 99.4 F (37.4 C)  SpO2: 98% 98%    Physical Exam: Cardiac:  regular Lungs:  Non labored Incisions:  Left groin as well thigh incisions are clean and dry; mild serous drainage on bandage of medial incision distally.  Lateral incision clean.   Extremities:  +palpable left AT; left foot is warm and well perfused; wound vac with good seal.   CBC    Component Value Date/Time   WBC 11.7 (H) 07/04/2018 0347   RBC 3.06 (L) 07/04/2018 0347   HGB 8.2 (L) 07/07/2018 0652   HCT 25.5 (L) 07/07/2018 0652   PLT 228 07/04/2018 0347   MCV 82.4 07/04/2018 0347   MCH 26.8 07/04/2018 0347   MCHC 32.5 07/04/2018 0347   RDW 19.2 (H) 07/04/2018 0347   LYMPHSABS 0.6 (L) 06/27/2018 1045   MONOABS 0.9 06/27/2018 1045   EOSABS 0.1 06/27/2018 1045   BASOSABS 0.0 06/27/2018 1045    BMET    Component Value Date/Time   NA 135 07/06/2018 0652   K 3.7 07/06/2018 0652   CL 103 07/06/2018 0652   CO2 23 07/06/2018 0652   GLUCOSE 137 (H) 07/06/2018 0652   BUN 16 07/06/2018 0652   CREATININE 1.33 (H) 07/06/2018 0652   CALCIUM 8.7 (L) 07/06/2018 0652   GFRNONAA 55 (L) 07/06/2018 0652   GFRAA >60 07/06/2018 0652    INR    Component Value Date/Time   INR 0.93 12/01/2012 1908     Intake/Output Summary (Last 24 hours) at 07/08/2018 0716 Last data filed at 07/08/2018 0700 Gross per 24 hour  Intake 500 ml  Output 1825 ml  Net -1325 ml     Assessment:  68 y.o. male is s/p:  1.Harvest left greater saphenous vein 2.Left peroneal artery thrombectomy 3.Left SFA to peroneal artery bypass with non-reversed greater saphenous vein 4.Left lower extremity angiogram 5.Left SFA to anterior tibial artery bypass with ipsilateral  non-reversed greater saphenous vein 6. Amputation left toes 2 through 5  7 Days Post-Op   Plan: -pt left foot warm and well perfused -wound vac changed yesterday. -walked in hallway yesterday with PT -still awaiting SNF as first couple of choices are full -DVT prophylaxis:  Xarelto   Leontine Locket, PA-C [facility] [phone] 07/08/2018 7:16 AM   I have independently interviewed and examined patient and agree with PA assessment and plan above.   Keiona Jenison C. Donzetta Matters, MD [facility] [place] Office: [phone] Pager: [phone]

## 2018-07-08 NOTE — Progress Notes (Signed)
RT placed pt on CPAP dream station for the night. Pt resting comfortably. RT will continue to monitor.

## 2018-07-08 NOTE — Progress Notes (Signed)
PROGRESS NOTE    EUAN WANDLER  TUU:[id number] DOB: 1950-11-04 DOA: 06/27/2018 PCP: [name], MD   Brief Narrative:  HPI on 06/27/2018 by Dr. Myrene Buddy UNO ESAU is a 68 y.o. 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  The patient was treated for restenosis of his left SFA in Oct complicated by in stent thrombosis and requiring 2 repeat angiograms for stenting. 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  Since then, he seemed to be improving, was ambulating but not back to work until about one week prior to admission when he started to have new pain, redness and swelling of his left foot.   Two days ago he was seen in wound care, had a new ulcer on the left THIRD digit, was started on Levaquin which he has taken.  Then today, this third digit was completely purple, so he was sent from wound care center for IV antibiotics.  He has had no fever, chills, vomiting, LOC, confusion.    Interim history Patient admitted with gangrene of the left toes.  Vascular surgery consulted and appreciated.  Status post angiogram as well as amputation of the left toes #2 through 5, left SFA to peroneal artery bypass, left SFA to anterior tibial artery bypass. Pending SNF. 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 -Patient with critical left lower extremity ischemia with necrotic toes on admission -MRI of the left foot showed osteonecrosis of the distal phalanx of the little toe, marrow edema in the proximal phalanx of the fifth toe consistent with osteomyelitis, bony destructive changes of the tuft of the third toe consistent with osteomyelitis, soft tissue gas about the distal phalanx of the third toe consistent with cellulitis -Vascular surgery consulted and  appreciated -S/post angiogram on 06/30/2018, amputation of left toes 2 through 5, left SFA to peroneal artery bypass, left SFA to anterior tibial artery bypass, angiogram -Vancomycin and Zosyn discontinued -Was transfused 2 units PRBC -PT/OT recommending SNF with wheelchair.  -Social work consulted- pending SNF -wound vac in place, wound VAC to be changed today -Patient to follow up with vascular surgery, Dr. Donzetta Matters in 2 weeks  Diabetes mellitus, type II -Hemoglobin A1c 6.5 -Continue Lantus, insulin sliding scale and CBG monitoring -Diabetes coordinator consulted  Left popliteal vein DVT -Lower extremity Doppler on 06/28/2018 showed left popliteal vein DVT -Xarelto currently held per vascular surgery -Continue Xarelto  Essential hypertension -Continue Coreg, Cozaar, triamterene/HCTZ, IV hydralazine PRN  Gout -Stable, continue allopurinol, colchicine  Chronic anemia -Baseline hemoglobin approximately 8, dropped to 7 -Patient was given 2 units PRBC, currently hemoglobin 8.2 -anemia panel showed an iron of 15, ferritin 179 -Patient given dose of Feraheme on 07/04/2018 -Continue to monitor CBC  CKD, stage III -Creatinine appears to be at baseline (1.5-1.7) -Continue to monitor BMP  DVT Prophylaxis Xarelto  Code Status: Full  Family Communication: None at bedside  Disposition Plan: Admitted. Pending SNF placement/insurance authorization.  Requiring SNF given his recent extensive surgery as well as toe amputations.  Have poor standing balance and having to rely heavily on rolling walker.  Consultants Vascular surgery  Procedures  Procedure Performed: 06/30/2018 1.Ultrasound-guided access of the right common femoral artery 2.Aortogram 3.Left lower extremity arteriogram with selection of second order branches 4.45 minutesofmonitored moderate conscious  sedation time 5.Mynx closure of the right common femoral artery  Antibiotics   Anti-infectives (From admission,  onward)   Start     Dose/Rate Route Frequency Ordered Stop   07/02/18 1000  vancomycin (VANCOCIN) 1,500 mg in sodium chloride 0.9 % 500 mL IVPB     1,500 mg 250 mL/hr over 120 Minutes Intravenous Every 24 hours 07/01/18 2026 07/02/18 1057   07/01/18 0900  vancomycin (VANCOCIN) 1,500 mg in sodium chloride 0.9 % 500 mL IVPB     1,500 mg 250 mL/hr over 120 Minutes Intravenous To [facility] 07/01/18 0855 07/01/18 1023   06/29/18 0900  vancomycin (VANCOCIN) 1,500 mg in sodium chloride 0.9 % 500 mL IVPB  Status:  Discontinued     1,500 mg 250 mL/hr over 120 Minutes Intravenous Every 24 hours 06/28/18 1443 07/01/18 0855   06/28/18 1500  metroNIDAZOLE (FLAGYL) IVPB 500 mg     500 mg 100 mL/hr over 60 Minutes Intravenous Every 8 hours 06/28/18 1443 07/02/18 2359   06/28/18 1500  cefTRIAXone (ROCEPHIN) 2 g in sodium chloride 0.9 % 100 mL IVPB     2 g 200 mL/hr over 30 Minutes Intravenous Every 24 hours 06/28/18 1443 07/02/18 2359   06/28/18 1000  vancomycin (VANCOCIN) 1,500 mg in sodium chloride 0.9 % 500 mL IVPB  Status:  Discontinued     1,500 mg 250 mL/hr over 120 Minutes Intravenous Every 24 hours 06/27/18 1536 06/28/18 1435   06/27/18 1500  cefTRIAXone (ROCEPHIN) 2 g in sodium chloride 0.9 % 100 mL IVPB  Status:  Discontinued     2 g 200 mL/hr over 30 Minutes Intravenous Every 24 hours 06/27/18 1414 06/28/18 1435   06/27/18 1500  metroNIDAZOLE (FLAGYL) IVPB 500 mg  Status:  Discontinued     500 mg 100 mL/hr over 60 Minutes Intravenous Every 8 hours 06/27/18 1414 06/28/18 1435   06/27/18 1015  vancomycin (VANCOCIN) 2,000 mg in sodium chloride 0.9 % 500 mL IVPB     2,000 mg 250 mL/hr over 120 Minutes Intravenous  Once 06/27/18 1013 06/27/18 1247      Subjective:   [name] seen and examined today.  Denies current chest pain, shortness breath, abdominal pain, nausea or vomiting, diarrhea constipation, dizziness or headache.  Objective:   Vitals:   07/08/18 0037 07/08/18 0539  07/08/18 1042 07/08/18 1306  BP: 96/83 125/87 130/84 121/76  Pulse: 62 68 79 72  Resp: 18 (!) 22 13 17   Temp: 98.1 F (36.7 C) 99.4 F (37.4 C) 97.8 F (36.6 C) 98.1 F (36.7 C)  TempSrc: Oral Oral Oral Oral  SpO2: 98% 98% 99% 100%  Weight:      Height:        Intake/Output Summary (Last 24 hours) at 07/08/2018 1436 Last data filed at 07/08/2018 1309 Gross per 24 hour  Intake 943 ml  Output 1975 ml  Net -1032 ml   Filed Weights   06/27/18 0857  Weight: 104.3 kg   Exam  General: Well developed, well nourished, NAD, appears stated age  HEENT: NCAT, mucous membranes moist.   Neck: Supple  Cardiovascular: S1 S2 auscultated, no rubs, murmurs or gallops. Regular rate and rhythm.  Respiratory: Clear to auscultation bilaterally with equal chest rise  Abdomen: Soft, nontender, nondistended, + bowel sounds  Extremities: warm dry without cyanosis clubbing. Trace LE edema. Left foot wound vac. Incisions clean.  Neuro: AAOx3, nonfocal  Psych: Appropriate mood and affect, pleasant  Data Reviewed: I have personally reviewed following labs  and imaging studies  CBC: Recent Labs  Lab 07/02/18 0506  07/03/18 0311 07/04/18 0347 07/05/18 0307 07/06/18 0320 07/07/18 0652  WBC 9.1  --  11.4* 11.7*  --   --   --   HGB 7.0*   < > 8.3* 8.2* 7.8* 8.2* 8.2*  HCT 20.9*   < > 25.4* 25.2* 24.5* 25.5* 25.5*  MCV 78.9*  --  80.6 82.4  --   --   --   PLT 248  --  212 228  --   --   --    < > = values in this interval not displayed.   Basic Metabolic Panel: Recent Labs  Lab 07/02/18 0506 07/03/18 0311 07/04/18 0347 07/05/18 0307 07/06/18 0652  NA 136 137 133* 134* 135  K 4.5 4.1 3.9 3.8 3.7  CL 109 108 105 104 103  CO2 21* 21* 21* 21* 23  GLUCOSE 219* 152* 168* 177* 137*  BUN 15 20 19 19 16   CREATININE 1.23 1.50* 1.44* 1.29* 1.33*  CALCIUM 8.2* 7.9* 8.2* 8.4* 8.7*  MG  --   --   --   --  2.0   GFR: Estimated Creatinine Clearance: 68.4 mL/min (A) (by C-G formula based on  SCr of 1.33 mg/dL (H)). Liver Function Tests: No results for input(s): AST, ALT, ALKPHOS, BILITOT, PROT, ALBUMIN in the last 168 hours. No results for input(s): LIPASE, AMYLASE in the last 168 hours. No results for input(s): AMMONIA in the last 168 hours. Coagulation Profile: No results for input(s): INR, PROTIME in the last 168 hours. Cardiac Enzymes: No results for input(s): CKTOTAL, CKMB, CKMBINDEX, TROPONINI in the last 168 hours. BNP (last 3 results) No results for input(s): PROBNP in the last 8760 hours. HbA1C: No results for input(s): HGBA1C in the last 72 hours. CBG: Recent Labs  Lab 07/07/18 1136 07/07/18 1619 07/07/18 2125 07/08/18 0639 07/08/18 1114  GLUCAP 158* 125* 175* 124* 183*   Lipid Profile: No results for input(s): CHOL, HDL, LDLCALC, TRIG, CHOLHDL, LDLDIRECT in the last 72 hours. Thyroid Function Tests: No results for input(s): TSH, T4TOTAL, FREET4, T3FREE, THYROIDAB in the last 72 hours. Anemia Panel: No results for input(s): VITAMINB12, FOLATE, FERRITIN, TIBC, IRON, RETICCTPCT in the last 72 hours. Urine analysis:    Component Value Date/Time   COLORURINE YELLOW 04/29/2018 0820   APPEARANCEUR CLEAR 04/29/2018 0820   LABSPEC 1.013 04/29/2018 0820   PHURINE 5.0 04/29/2018 0820   GLUCOSEU 50 (A) 04/29/2018 0820   HGBUR NEGATIVE 04/29/2018 0820   BILIRUBINUR NEGATIVE 04/29/2018 0820   KETONESUR NEGATIVE 04/29/2018 0820   PROTEINUR NEGATIVE 04/29/2018 0820   UROBILINOGEN 0.2 12/01/2012 2135   NITRITE NEGATIVE 04/29/2018 0820   LEUKOCYTESUR NEGATIVE 04/29/2018 0820   Sepsis Labs: @LABRCNTIP (procalcitonin:4,lacticidven:4)  ) No results found for this or any previous visit (from the past 240 hour(s)).    Radiology Studies: No results found.   Scheduled Meds: . sodium chloride   Intravenous Once  . sodium chloride   Intravenous Once  . sodium chloride   Intravenous Once  . allopurinol  100 mg Oral Daily  . carvedilol  12.5 mg Oral Q1400  .  clopidogrel  75 mg Oral Daily  . colchicine  0.6 mg Oral BID  . docusate sodium  100 mg Oral Daily  . dorzolamide-timolol  1 drop Both Eyes BID  . feeding supplement (GLUCERNA SHAKE)  237 mL Oral BID BM  . feeding supplement (PRO-STAT SUGAR FREE 64)  30 mL Oral Daily  .  insulin aspart  0-15 Units Subcutaneous TID WC  . insulin aspart  0-5 Units Subcutaneous QHS  . insulin aspart  4 Units Subcutaneous TID WC  . insulin glargine  20 Units Subcutaneous QHS  . latanoprost  1 drop Both Eyes QHS  . losartan  100 mg Oral Q1400  . pantoprazole  40 mg Oral Daily  . pravastatin  40 mg Oral QHS  . pregabalin  50-100 mg Oral QHS  . [START ON 07/25/2018] rivaroxaban  20 mg Oral Q supper   Followed by  . rivaroxaban  15 mg Oral BID WC  . sodium chloride flush  3 mL Intravenous Q12H  . triamterene-hydrochlorothiazide  1 tablet Oral Q1400   Continuous Infusions:    LOS: 11 days   Time Spent in minutes   30 minutes  [place] Gola D.O. on 07/08/2018 at 2:36 PM  Between 7am to 7pm - Please see pager noted on [email or web address]  After 7pm go to [email or web address]  And look for the night coverage person covering for me after hours  [facility] Office  [phone]

## 2018-07-08 NOTE — Progress Notes (Signed)
Physical Therapy Treatment Patient Details Name: [name] MRN: [id number] DOB: 1951-06-03 Today's Date: 07/08/2018    History of Present Illness Pt is a 68 y.o. M with significant PMH of gout, HTN, IDDM, CKD III, PVD s/p recent LLE stenting, who presents with left foot pain, redness and swelling. 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   PT Comments    Patient seen for activity progression. Continued to tolerate ambulation in hall today with cues for compliance in [place]. Increased time and effort with reliance on RW and min guard for safety.    Follow Up Recommendations  SNF;Supervision for mobility/OOB     Equipment Recommendations  Wheelchair (measurements PT)    Recommendations for Other Services       Precautions / Restrictions Precautions Precautions: Fall Precaution Comments: wound vac Required Braces or Orthoses: Other Brace Other Brace: darco shoe Restrictions Weight Bearing Restrictions: Yes LLE Weight Bearing: Partial weight bearing LLE Partial Weight Bearing Percentage or Pounds: (Heel weightbearing)    Mobility  Bed Mobility Overal bed mobility: Needs Assistance Bed Mobility: Supine to Sit     Supine to sit: Supervision        Transfers Overall transfer level: Needs assistance Equipment used: Rolling walker (2 wheeled) Transfers: Sit to/from Stand Sit to Stand: Min guard         General transfer comment: min guard for safety  Ambulation/Gait Ambulation/Gait assistance: Min guard Gait Distance (Feet): 75 Feet(x2) Assistive device: Rolling walker (2 wheeled) Gait Pattern/deviations: Trunk flexed Gait velocity: decreased Gait velocity interpretation: <1.31 ft/sec, indicative of household ambulator General Gait Details: Step to hopping with mobility, heavy relaince on RW for support, cues for positinoing and PWBing restrictions. One rest break in  standing   Stairs             Wheelchair Mobility    Modified Rankin (Stroke Patients Only)       Balance Overall balance assessment: Needs assistance   Sitting balance-Leahy Scale: Good     Standing balance support: Bilateral upper extremity supported Standing balance-Leahy Scale: Poor Standing balance comment: heavy reliance on RW                            Cognition Arousal/Alertness: Awake/alert Behavior During Therapy: WFL for tasks assessed/performed Overall Cognitive Status: Within Functional Limits for tasks assessed                                        Exercises      General Comments        Pertinent Vitals/Pain Pain Assessment: Faces Faces Pain Scale: Hurts little more Pain Location: groin and LE Pain Descriptors / Indicators: Tightness Pain Intervention(s): Monitored during session    Home Living                      Prior Function            PT Goals (current goals can now be found in the care plan section) Acute Rehab PT Goals Patient Stated Goal: "Move this left leg." PT Goal Formulation: With patient Time For Goal Achievement: 07/17/18 Potential to Achieve Goals: Good Progress towards PT goals: Progressing toward goals    Frequency    Min 3X/week  PT Plan Current plan remains appropriate    Co-evaluation              AM-PAC PT "6 Clicks" Mobility   Outcome Measure  Help needed turning from your back to your side while in a flat bed without using bedrails?: None Help needed moving from lying on your back to sitting on the side of a flat bed without using bedrails?: None Help needed moving to and from a bed to a chair (including a wheelchair)?: A Little Help needed standing up from a chair using your arms (e.g., wheelchair or bedside chair)?: A Lot Help needed to walk in hospital room?: A Lot Help needed climbing 3-5 steps with a railing? : Total 6 Click Score: 16     End of Session Equipment Utilized During Treatment: Gait belt;Other (comment)(darco shoe) Activity Tolerance: Patient limited by pain Patient left: with call bell/phone within reach;in chair Nurse Communication: Mobility status PT Visit Diagnosis: Unsteadiness on feet (R26.81);Other abnormalities of gait and mobility (R26.89);Pain Pain - Right/Left: Left Pain - part of body: Leg     Time: 1005-1027 PT Time Calculation (min) (ACUTE ONLY): 22 min  Charges:  $Gait Training: 8-22 mins                     Alben Deeds, PT DPT  Board Certified Neurologic Specialist [place] Pager [phone] Office [place] 07/08/2018, 12:40 PM

## 2018-07-08 NOTE — Progress Notes (Signed)
CSW called and informed patient's update on SNF placement status: [facility], [facility] and [facility] did not have availability. CSW has contacted [facility] at [facility]- decision is currently pending.   [name], [place] Social Worker [phone]

## 2018-07-09 LAB — BASIC METABOLIC PANEL
Anion gap: 12 (ref 5–15)
BUN: 21 mg/dL (ref 8–23)
CO2: 22 mmol/L (ref 22–32)
Calcium: 9.1 mg/dL (ref 8.9–10.3)
Chloride: 101 mmol/L (ref 98–111)
Creatinine, Ser: 1.48 mg/dL — ABNORMAL HIGH (ref 0.61–1.24)
GFR calc Af Amer: 56 mL/min — ABNORMAL LOW (ref >60)
GFR calc non Af Amer: 48 mL/min — ABNORMAL LOW (ref >60)
Glucose, Bld: 155 mg/dL — ABNORMAL HIGH (ref 70–99)
Potassium: 4.4 mmol/L (ref 3.5–5.1)
Sodium: 135 mmol/L (ref 135–145)

## 2018-07-09 LAB — GLUCOSE, CAPILLARY
Glucose-Capillary: 116 mg/dL — ABNORMAL HIGH (ref 70–99)
Glucose-Capillary: 171 mg/dL — ABNORMAL HIGH (ref 70–99)
Glucose-Capillary: 166 mg/dL — ABNORMAL HIGH (ref 70–99)
Glucose-Capillary: 192 mg/dL — ABNORMAL HIGH (ref 70–99)

## 2018-07-09 LAB — HEMOGLOBIN AND HEMATOCRIT, BLOOD
HCT: 26.3 % — ABNORMAL LOW (ref 39.0–52.0)
Hemoglobin: 8.2 g/dL — ABNORMAL LOW (ref 13.0–17.0)

## 2018-07-09 NOTE — Progress Notes (Signed)
Occupational Therapy Treatment Patient Details Name: [name] MRN: [id number] DOB: [birth year] Today's Date: 07/09/2018    History of present illness Pt is a 68 y.o. M with significant PMH of gout, HTN, IDDM, CKD III, PVD s/p recent LLE stenting, who presents with left foot pain, redness and swelling. 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   OT comments  Pt making good progress with functional goals and reports pain as less of a hindrance. Pt reports that he is ready to d/c to SNF for rehab. Pt participated in LB ADLs tasks seated at EOB, toilet transfers to [facility], toileting tasks and ADL mobility using RW. OT will continue to follow acutely  Follow Up Recommendations  SNF    Equipment Recommendations  None recommended by OT    Recommendations for Other Services      Precautions / Restrictions Precautions Precautions: Fall Precaution Comments: wound vac Required Braces or Orthoses: Other Brace Other Brace: darco shoe Restrictions Weight Bearing Restrictions: Yes LLE Weight Bearing: Partial weight bearing       Mobility Bed Mobility Overal bed mobility: Needs Assistance Bed Mobility: Supine to Sit     Supine to sit: Supervision        Transfers Overall transfer level: Needs assistance Equipment used: Rolling walker (2 wheeled) Transfers: Sit to/from Stand Sit to Stand: Min guard         General transfer comment: min guard for safety    Balance Overall balance assessment: Needs assistance   Sitting balance-Leahy Scale: Good     Standing balance support: Bilateral upper extremity supported;During functional activity Standing balance-Leahy Scale: Poor                             ADL either performed or assessed with clinical judgement   ADL Overall ADL's : Needs assistance/impaired Eating/Feeding: Independent;Sitting   Grooming: Wash/dry  hands;Wash/dry face;Minimal assistance;Standing       Lower Body Bathing: Minimal assistance;Sitting/lateral leans       Lower Body Dressing: Moderate assistance;Sitting/lateral leans   Toilet Transfer: Ambulation;RW;Cueing for safety;Min guard;BSC   Toileting- Clothing Manipulation and Hygiene: Sit to/from stand;Minimal assistance       Functional mobility during ADLs: Cueing for safety;Rolling walker;Min guard       Vision Baseline Vision/History: Wears glasses Wears Glasses: At all times Patient Visual Report: No change from baseline     Perception     Praxis      Cognition Arousal/Alertness: Awake/alert Behavior During Therapy: WFL for tasks assessed/performed Overall Cognitive Status: Within Functional Limits for tasks assessed                                          Exercises     Shoulder Instructions       General Comments      Pertinent Vitals/ Pain       Pain Assessment: 0-10 Pain Score: 7  Pain Location: groin and LE Pain Descriptors / Indicators: Tightness Pain Intervention(s): Monitored during session;Repositioned;Premedicated before session  Home Living  Prior Functioning/Environment              Frequency  Min 2X/week        Progress Toward Goals  OT Goals(current goals can now be found in the care plan section)  Progress towards OT goals: Progressing toward goals     Plan Discharge plan remains appropriate    Co-evaluation                 AM-PAC OT "6 Clicks" Daily Activity     Outcome Measure   Help from another person eating meals?: None Help from another person taking care of personal grooming?: A Little Help from another person toileting, which includes using toliet, bedpan, or urinal?: A Little Help from another person bathing (including washing, rinsing, drying)?: A Lot Help from another person to put on and taking off regular  upper body clothing?: None Help from another person to put on and taking off regular lower body clothing?: A Lot 6 Click Score: 18    End of Session Equipment Utilized During Treatment: Gait belt;Rolling walker;Other (comment)(BSC)  OT Visit Diagnosis: Unsteadiness on feet (R26.81);Other abnormalities of gait and mobility (R26.89);Muscle weakness (generalized) (M62.81);Pain Pain - Right/Left: Left Pain - part of body: Leg   Activity Tolerance Patient tolerated treatment well   Patient Left in chair;with call bell/phone within reach   Nurse Communication          Time: 8832-5498 OT Time Calculation (min): 26 min  Charges: OT General Charges $OT Visit: 1 Visit OT Treatments $Self Care/Home Management : 8-22 mins $Therapeutic Activity: 8-22 mins     Britt Bottom 07/09/2018, 1:24 PM

## 2018-07-09 NOTE — Progress Notes (Signed)
  Progress Note    07/09/2018 2:07 PM 8 Days Post-Op  Subjective:  Shooting pains in left leg  Vitals:   07/09/18 1210 07/09/18 1355  BP: 124/70 102/68  Pulse: 63 72  Resp: 20 18  Temp: 98.5 F (36.9 C)   SpO2: 98%     Physical Exam: Incisions healing well Left foot vac to suction Palpable left dp  CBC    Component Value Date/Time   WBC 11.7 (H) 07/04/2018 0347   RBC 3.06 (L) 07/04/2018 0347   HGB 8.2 (L) 07/09/2018 0316   HCT 26.3 (L) 07/09/2018 0316   PLT 228 07/04/2018 0347   MCV 82.4 07/04/2018 0347   MCH 26.8 07/04/2018 0347   MCHC 32.5 07/04/2018 0347   RDW 19.2 (H) 07/04/2018 0347   LYMPHSABS 0.6 (L) 06/27/2018 1045   MONOABS 0.9 06/27/2018 1045   EOSABS 0.1 06/27/2018 1045   BASOSABS 0.0 06/27/2018 1045    BMET    Component Value Date/Time   NA 135 07/09/2018 0316   K 4.4 07/09/2018 0316   CL 101 07/09/2018 0316   CO2 22 07/09/2018 0316   GLUCOSE 155 (H) 07/09/2018 0316   BUN 21 07/09/2018 0316   CREATININE 1.48 (H) 07/09/2018 0316   CALCIUM 9.1 07/09/2018 0316   GFRNONAA 48 (L) 07/09/2018 0316   GFRAA 56 (L) 07/09/2018 0316    INR    Component Value Date/Time   INR 0.93 12/01/2012 1908     Intake/Output Summary (Last 24 hours) at 07/09/2018 1407 Last data filed at 07/09/2018 1215 Gross per 24 hour  Intake 665 ml  Output 2315 ml  Net -1650 ml     Assessment:  68 y.o. male is s/p left sfa-AT bypass with vein  Plan:  ok for dc Will f/u in a few weeks for wound check    Khadijah Mastrianni C. Donzetta Matters, MD [facility] Office: [phone] Pager: [phone]  07/09/2018 2:07 PM

## 2018-07-09 NOTE — Progress Notes (Signed)
PROGRESS NOTE    [name]  WUJ:[id number] DOB: 1950-10-10 DOA: 06/27/2018 PCP: [name], MD   Brief Narrative:  HPI on 06/27/2018 by Dr. Myrene Buddy [name] is a 68 y.o. 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  The patient was treated for restenosis of his left SFA in Oct complicated by in stent thrombosis and requiring 2 repeat angiograms for stenting. 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  Since then, he seemed to be improving, was ambulating but not back to work until about one week prior to admission when he started to have new pain, redness and swelling of his left foot.   Two days ago he was seen in wound care, had a new ulcer on the left THIRD digit, was started on Levaquin which he has taken.  Then today, this third digit was completely purple, so he was sent from wound care center for IV antibiotics.  He has had no fever, chills, vomiting, LOC, confusion.    Interim history Patient admitted with gangrene of the left toes.  Vascular surgery consulted and appreciated.  Status post angiogram as well as amputation of the left toes #2 through 5, left SFA to peroneal artery bypass, left SFA to anterior tibial artery bypass. Pending SNF. 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 -Patient with critical left lower extremity ischemia with necrotic toes on admission -MRI of the left foot showed osteonecrosis of the distal phalanx of the little toe, marrow edema in the proximal phalanx of the fifth toe consistent with osteomyelitis, bony destructive changes of the tuft of the third toe consistent with osteomyelitis, soft tissue gas about the distal phalanx of the third toe consistent with cellulitis -Vascular surgery consulted and  appreciated -S/post angiogram on 06/30/2018, amputation of left toes 2 through 5, left SFA to peroneal artery bypass, left SFA to anterior tibial artery bypass, angiogram -Vancomycin and Zosyn discontinued -Was transfused 2 units PRBC -PT/OT recommending SNF with wheelchair.  -Social work consulted- pending SNF -wound vac in place, wound VAC to be changed 07/07/2018 -Patient to follow up with vascular surgery, Dr. Donzetta Matters in 2 weeks  Diabetes mellitus, type II -Hemoglobin A1c 6.5 -Continue Lantus, insulin sliding scale and CBG monitoring -Diabetes coordinator consulted  Left popliteal vein DVT -Lower extremity Doppler on 06/28/2018 showed left popliteal vein DVT -Xarelto currently held per vascular surgery -Continue Xarelto  Essential hypertension -Continue Coreg, Cozaar, triamterene/HCTZ, IV hydralazine PRN  Gout -Stable, continue allopurinol, colchicine  Chronic anemia -Baseline hemoglobin approximately 8, dropped to 7 -Patient was given 2 units PRBC, currently hemoglobin 8.2 -anemia panel showed an iron of 15, ferritin 179 -Patient given dose of Feraheme on 07/04/2018 -Continue to monitor CBC  CKD, stage III -Creatinine appears to be at baseline (1.5-1.7) -Continue to monitor BMP  DVT Prophylaxis Xarelto  Code Status: Full  Family Communication: None at bedside  Disposition Plan: Admitted. Pending SNF placement/insurance authorization.  Requiring SNF given his recent extensive surgery as well as toe amputations.  Has poor standing balance and having to rely heavily on rolling walker.  Consultants Vascular surgery  Procedures  Procedure Performed: 06/30/2018 1.Ultrasound-guided access of the right common femoral artery 2.Aortogram 3.Left lower extremity arteriogram with selection of second order branches 4.45 minutesofmonitored moderate conscious  sedation time 5.Mynx closure of the right common femoral artery  Antibiotics   Anti-infectives (From admission,  onward)   Start     Dose/Rate Route Frequency Ordered Stop   07/02/18 1000  vancomycin (VANCOCIN) 1,500 mg in sodium chloride 0.9 % 500 mL IVPB     1,500 mg 250 mL/hr over 120 Minutes Intravenous Every 24 hours 07/01/18 2026 07/02/18 1057   07/01/18 0900  vancomycin (VANCOCIN) 1,500 mg in sodium chloride 0.9 % 500 mL IVPB     1,500 mg 250 mL/hr over 120 Minutes Intravenous To [facility]Stay [facility] 07/01/18 0855 07/01/18 1023   06/29/18 0900  vancomycin (VANCOCIN) 1,500 mg in sodium chloride 0.9 % 500 mL IVPB  Status:  Discontinued     1,500 mg 250 mL/hr over 120 Minutes Intravenous Every 24 hours 06/28/18 1443 07/01/18 0855   06/28/18 1500  metroNIDAZOLE (FLAGYL) IVPB 500 mg     500 mg 100 mL/hr over 60 Minutes Intravenous Every 8 hours 06/28/18 1443 07/02/18 2359   06/28/18 1500  cefTRIAXone (ROCEPHIN) 2 g in sodium chloride 0.9 % 100 mL IVPB     2 g 200 mL/hr over 30 Minutes Intravenous Every 24 hours 06/28/18 1443 07/02/18 2359   06/28/18 1000  vancomycin (VANCOCIN) 1,500 mg in sodium chloride 0.9 % 500 mL IVPB  Status:  Discontinued     1,500 mg 250 mL/hr over 120 Minutes Intravenous Every 24 hours 06/27/18 1536 06/28/18 1435   06/27/18 1500  cefTRIAXone (ROCEPHIN) 2 g in sodium chloride 0.9 % 100 mL IVPB  Status:  Discontinued     2 g 200 mL/hr over 30 Minutes Intravenous Every 24 hours 06/27/18 1414 06/28/18 1435   06/27/18 1500  metroNIDAZOLE (FLAGYL) IVPB 500 mg  Status:  Discontinued     500 mg 100 mL/hr over 60 Minutes Intravenous Every 8 hours 06/27/18 1414 06/28/18 1435   06/27/18 1015  vancomycin (VANCOCIN) 2,000 mg in sodium chloride 0.9 % 500 mL IVPB     2,000 mg 250 mL/hr over 120 Minutes Intravenous  Once 06/27/18 1013 06/27/18 1247      Subjective:   [name] seen and examined today.  Feeling better today. Denies current chest pain, shortness breath, abdominal pain, nausea or vomiting, diarrhea constipation, dizziness or headache.  Objective:   Vitals:    07/08/18 2352 07/09/18 0444 07/09/18 0815 07/09/18 1210  BP:  139/77 125/64 124/70  Pulse: 64 65 68 63  Resp: 16 15 16 20   Temp:  97.8 F (36.6 C) 98.3 F (36.8 C) 98.5 F (36.9 C)  TempSrc:  Oral Oral Oral  SpO2: 98% 97% 98% 98%  Weight:      Height:        Intake/Output Summary (Last 24 hours) at 07/09/2018 1219 Last data filed at 07/09/2018 1215 Gross per 24 hour  Intake 825 ml  Output 2440 ml  Net -1615 ml   Filed Weights   06/27/18 0857  Weight: 104.3 kg   Exam  General: Well developed, well nourished, NAD, appears stated age  43: NCAT, mucous membranes moist.   Neck: Supple  Cardiovascular: S1 S2 auscultated, no rubs, murmurs or gallops. Regular rate and rhythm.  Respiratory: Clear to auscultation bilaterally with equal chest rise  Abdomen: Soft, nontender, nondistended, + bowel sounds  Extremities: warm dry without cyanosis clubbing. Trace LE edema. Left foot wound vac. Incisions clean.  Neuro: AAOx3, nonfocal  Psych: Appropriate mood and affect, pleasant  Data Reviewed: I have personally reviewed following labs and  imaging studies  CBC: Recent Labs  Lab 07/03/18 0311 07/04/18 0347 07/05/18 0307 07/06/18 0320 07/07/18 0652 07/09/18 0316  WBC 11.4* 11.7*  --   --   --   --   HGB 8.3* 8.2* 7.8* 8.2* 8.2* 8.2*  HCT 25.4* 25.2* 24.5* 25.5* 25.5* 26.3*  MCV 80.6 82.4  --   --   --   --   PLT 212 228  --   --   --   --    Basic Metabolic Panel: Recent Labs  Lab 07/03/18 0311 07/04/18 0347 07/05/18 0307 07/06/18 0652 07/09/18 0316  NA 137 133* 134* 135 135  K 4.1 3.9 3.8 3.7 4.4  CL 108 105 104 103 101  CO2 21* 21* 21* 23 22  GLUCOSE 152* 168* 177* 137* 155*  BUN 20 19 19 16 21   CREATININE 1.50* 1.44* 1.29* 1.33* 1.48*  CALCIUM 7.9* 8.2* 8.4* 8.7* 9.1  MG  --   --   --  2.0  --    GFR: Estimated Creatinine Clearance: 61.4 mL/min (A) (by C-G formula based on SCr of 1.48 mg/dL (H)). Liver Function Tests: No results for input(s): AST,  ALT, ALKPHOS, BILITOT, PROT, ALBUMIN in the last 168 hours. No results for input(s): LIPASE, AMYLASE in the last 168 hours. No results for input(s): AMMONIA in the last 168 hours. Coagulation Profile: No results for input(s): INR, PROTIME in the last 168 hours. Cardiac Enzymes: No results for input(s): CKTOTAL, CKMB, CKMBINDEX, TROPONINI in the last 168 hours. BNP (last 3 results) No results for input(s): PROBNP in the last 8760 hours. HbA1C: No results for input(s): HGBA1C in the last 72 hours. CBG: Recent Labs  Lab 07/08/18 1114 07/08/18 1643 07/08/18 2148 07/09/18 0631 07/09/18 1114  GLUCAP 183* 191* 176* 116* 171*   Lipid Profile: No results for input(s): CHOL, HDL, LDLCALC, TRIG, CHOLHDL, LDLDIRECT in the last 72 hours. Thyroid Function Tests: No results for input(s): TSH, T4TOTAL, FREET4, T3FREE, THYROIDAB in the last 72 hours. Anemia Panel: No results for input(s): VITAMINB12, FOLATE, FERRITIN, TIBC, IRON, RETICCTPCT in the last 72 hours. Urine analysis:    Component Value Date/Time   COLORURINE YELLOW 04/29/2018 0820   APPEARANCEUR CLEAR 04/29/2018 0820   LABSPEC 1.013 04/29/2018 0820   PHURINE 5.0 04/29/2018 0820   GLUCOSEU 50 (A) 04/29/2018 0820   HGBUR NEGATIVE 04/29/2018 0820   BILIRUBINUR NEGATIVE 04/29/2018 0820   KETONESUR NEGATIVE 04/29/2018 0820   PROTEINUR NEGATIVE 04/29/2018 0820   UROBILINOGEN 0.2 12/01/2012 2135   NITRITE NEGATIVE 04/29/2018 0820   LEUKOCYTESUR NEGATIVE 04/29/2018 0820   Sepsis Labs: @LABRCNTIP (procalcitonin:4,lacticidven:4)  ) No results found for this or any previous visit (from the past 240 hour(s)).    Radiology Studies: No results found.   Scheduled Meds: . sodium chloride   Intravenous Once  . sodium chloride   Intravenous Once  . sodium chloride   Intravenous Once  . allopurinol  100 mg Oral Daily  . carvedilol  12.5 mg Oral Q1400  . clopidogrel  75 mg Oral Daily  . colchicine  0.6 mg Oral BID  . docusate  sodium  100 mg Oral Daily  . dorzolamide-timolol  1 drop Both Eyes BID  . feeding supplement (GLUCERNA SHAKE)  237 mL Oral BID BM  . feeding supplement (PRO-STAT SUGAR FREE 64)  30 mL Oral Daily  . insulin aspart  0-15 Units Subcutaneous TID WC  . insulin aspart  0-5 Units Subcutaneous QHS  . insulin aspart  4 Units  Subcutaneous TID WC  . insulin glargine  20 Units Subcutaneous QHS  . latanoprost  1 drop Both Eyes QHS  . losartan  100 mg Oral Q1400  . pantoprazole  40 mg Oral Daily  . pravastatin  40 mg Oral QHS  . pregabalin  50-100 mg Oral QHS  . [START ON 07/25/2018] rivaroxaban  20 mg Oral Q supper   Followed by  . rivaroxaban  15 mg Oral BID WC  . sodium chloride flush  3 mL Intravenous Q12H  . triamterene-hydrochlorothiazide  1 tablet Oral Q1400   Continuous Infusions:    LOS: 12 days   Time Spent in minutes   30 minutes  Joycelyn Liska D.O. on 07/09/2018 at 12:19 PM  Between 7am to 7pm - Please see pager noted on [email or web address]  After 7pm go to [email or web address]  And look for the night coverage person covering for me after hours  [facility] Office  [phone]

## 2018-07-09 NOTE — Progress Notes (Addendum)
Patient will go to [facility] once he is discharged. SNF started insurance authorization today.   CSW updated RNCM, MD and patient's family.  [name], [place] Social Worker [phone]

## 2018-07-09 NOTE — Consult Note (Signed)
[place] Nurse wound follow up Wound type: surgical wound Measurement: 7cm x 4cm x 1.5cm  Wound bed: clean, pink, oozing along the proximal 1/2 of the wound bed Drainage (amount, consistency, odor) minimal in the VAC canister, no odor Periwound:superficial peeling of the proximal edge of the periwound, skin removed. Early maceration noted along the distal edge of the wound bed on the plantar surface  Dressing procedure/placement/frequency: Removed old NPWT dressing Periwound skin protected with strip of ostomy barrier ring to aid in seal against existing toe. Window framed wound with drape to protect skin due to maceration and skin peeling  Filled wound with  1 piece of black foam. Sealed NPWT dressing at 166mm HG Patient received IV pain medication per bedside nurse prior to dressing change Patient tolerated procedure well   WOC nurse will continue to provide NPWT dressing changed due to the complexity of the dressing change.  [place], [place], [place]

## 2018-07-10 LAB — GLUCOSE, CAPILLARY
Glucose-Capillary: 117 mg/dL — ABNORMAL HIGH (ref 70–99)
Glucose-Capillary: 230 mg/dL — ABNORMAL HIGH (ref 70–99)
Glucose-Capillary: 180 mg/dL — ABNORMAL HIGH (ref 70–99)

## 2018-07-10 MED ORDER — INSULIN ASPART 100 UNIT/ML ~~LOC~~ SOLN: 0.0000 [IU] | Freq: Three times a day (TID) | SUBCUTANEOUS | 11 refills | Status: DC

## 2018-07-10 MED ORDER — PANTOPRAZOLE SODIUM 40 MG PO TBEC: 40.0000 mg | DELAYED_RELEASE_TABLET | Freq: Every day | ORAL | 0 refills | Status: DC

## 2018-07-10 MED ORDER — PRO-STAT SUGAR FREE PO LIQD: 30.0000 mL | Freq: Every day | ORAL | 0 refills | Status: AC

## 2018-07-10 MED ORDER — RIVAROXABAN 20 MG PO TABS: 20.0000 mg | ORAL_TABLET | Freq: Every day | ORAL | 0 refills | Status: DC

## 2018-07-10 MED ORDER — RIVAROXABAN 15 MG PO TABS: 15.0000 mg | ORAL_TABLET | Freq: Two times a day (BID) | ORAL | 0 refills | Status: DC

## 2018-07-10 MED ORDER — OXYCODONE-ACETAMINOPHEN 5-325 MG PO TABS: 1.0000 | ORAL_TABLET | ORAL | 0 refills | Status: DC | PRN

## 2018-07-10 MED ORDER — GLUCERNA SHAKE PO LIQD: 237.0000 mL | Freq: Two times a day (BID) | ORAL | 0 refills | Status: AC

## 2018-07-10 MED ORDER — INSULIN GLARGINE 100 UNIT/ML ~~LOC~~ SOLN: 20.0000 [IU] | Freq: Every day | SUBCUTANEOUS | 0 refills | Status: DC

## 2018-07-10 NOTE — Progress Notes (Signed)
D/C instructions reviewed with pt and all questions answered. Report called to [facility]. Awaiting transportation via [place].   Clyde Canterbury, RN

## 2018-07-10 NOTE — Social Work (Signed)
Patient will discharge to [facility] Anticipated discharge date: 07/10/2018 Family notified: [name], spouse Transportation by: PTAR  Nurse to call report to [phone].  CSW signing off.  Estanislado Emms, [place]  Clinical Social Worker

## 2018-07-10 NOTE — Progress Notes (Signed)
Physical Therapy Treatment Patient Details Name: [name] MRN: [id number] DOB: 1951/01/10 Today's Date: 07/10/2018    History of Present Illness Pt is a 68 y.o. M with significant PMH of gout, HTN, IDDM, CKD III, PVD s/p recent LLE stenting, who presents with left foot pain, redness and swelling. 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    PT Comments    Patient making excellent progress towards his physical therapy goals. He is very motivated to participate. Demonstrates improvement in understanding of weightbearing status and implementation during ambulation. Ambulating 100 feet with walker and min guard assist. Does continue to require cueing for gait training and safety. Will progress as tolerated.   Follow Up Recommendations  SNF;Supervision for mobility/OOB     Equipment Recommendations  Wheelchair (measurements PT)    Recommendations for Other Services       Precautions / Restrictions Precautions Precautions: Fall Precaution Comments: wound vac Required Braces or Orthoses: Other Brace Other Brace: darco shoe Restrictions Weight Bearing Restrictions: Yes LLE Weight Bearing: Partial weight bearing    Mobility  Bed Mobility Overal bed mobility: Modified Independent                Transfers Overall transfer level: Needs assistance Equipment used: Rolling walker (2 wheeled) Transfers: Sit to/from Stand Sit to Stand: Min guard            Ambulation/Gait Ambulation/Gait assistance: Min guard Gait Distance (Feet): 100 Feet Assistive device: Rolling walker (2 wheeled) Gait Pattern/deviations: Trunk flexed;Step-to pattern;Decreased stance time - left;Decreased step length - right     General Gait Details: Mod verbal cues provided for decreased left step length, walker proximity with turning, and increased right foot clearance   Stairs              Wheelchair Mobility    Modified Rankin (Stroke Patients Only)       Balance Overall balance assessment: Needs assistance   Sitting balance-Leahy Scale: Good       Standing balance-Leahy Scale: Poor                              Cognition Arousal/Alertness: Awake/alert Behavior During Therapy: WFL for tasks assessed/performed Overall Cognitive Status: Within Functional Limits for tasks assessed                                 General Comments: Self distracts      Exercises General Exercises - Lower Extremity Heel Slides: 10 reps;Left;Supine Hip ABduction/ADduction: 10 reps;Left;Supine Straight Leg Raises: 10 reps;Left;Supine    General Comments        Pertinent Vitals/Pain Pain Assessment: Faces Faces Pain Scale: Hurts a little bit Pain Location: groin and LE Pain Descriptors / Indicators: Tightness Pain Intervention(s): Monitored during session    Home Living                      Prior Function            PT Goals (current goals can now be found in the care plan section) Acute Rehab PT Goals Patient Stated Goal: "walk in the halls more." Potential to Achieve Goals: Good Progress towards PT goals: Progressing toward goals    Frequency    Min 3X/week      PT Plan  Current plan remains appropriate    Co-evaluation              AM-PAC PT "6 Clicks" Mobility   Outcome Measure  Help needed turning from your back to your side while in a flat bed without using bedrails?: None Help needed moving from lying on your back to sitting on the side of a flat bed without using bedrails?: None Help needed moving to and from a bed to a chair (including a wheelchair)?: A Little Help needed standing up from a chair using your arms (e.g., wheelchair or bedside chair)?: A Little Help needed to walk in hospital room?: A Little Help needed climbing 3-5 steps with a railing? : Total 6 Click Score: 18    End of Session  Equipment Utilized During Treatment: Gait belt;Other (comment)(darco shoe) Activity Tolerance: Patient tolerated treatment well Patient left: in chair;with call bell/phone within reach Nurse Communication: Mobility status PT Visit Diagnosis: Unsteadiness on feet (R26.81);Other abnormalities of gait and mobility (R26.89);Pain Pain - Right/Left: Left Pain - part of body: Leg     Time: 1205-1231 PT Time Calculation (min) (ACUTE ONLY): 26 min  Charges:  $Gait Training: 8-22 mins $Therapeutic Exercise: 8-22 mins                     Ellamae Sia, PT, DPT Acute Rehabilitation Services Pager [phone] Office [phone]    Willy Eddy 07/10/2018, 2:38 PM

## 2018-07-10 NOTE — Clinical Social Work Placement (Signed)
   CLINICAL SOCIAL WORK PLACEMENT  NOTE  Date:  07/10/2018  Patient Details  Name: [name] MRN: [id number] Date of Birth: [birth year]  Clinical Social Work is seeking post-discharge placement for this patient at the [place] level of care (*CSW will initial, date and re-position this form in  chart as items are completed):  Yes   Patient/family provided with [place] Work Department's list of facilities offering this level of care within the geographic area requested by the patient (or if unable, by the patient's family).  Yes   Patient/family informed of their freedom to choose among providers that offer the needed level of care, that participate in Medicare, Medicaid or managed care program needed by the patient, have an available bed and are willing to accept the patient.  Yes   Patient/family informed of [facility]'s ownership interest in [facility] and [facility], as well as of the fact that they are under no obligation to receive care at these facilities.  PASRR submitted to EDS on 07/05/18     PASRR number received on 07/05/18     Existing PASRR number confirmed on       FL2 transmitted to all facilities in geographic area requested by pt/family on 07/06/18     FL2 transmitted to all facilities within larger geographic area on       Patient informed that his/her managed care company has contracts with or will negotiate with certain facilities, including the following:  [facility]     Yes   Patient/family informed of bed offers received.  Patient chooses bed at [facility]     Physician recommends and patient chooses bed at      Patient to be transferred to [facility] on 07/10/18.  Patient to be transferred to facility by PTAR     Patient family notified on 07/10/18 of transfer.  Name of family member notified:  [name], spouse     PHYSICIAN Please sign FL2, Please prepare priority discharge summary,  including medications, Please prepare prescriptions     Additional Comment:    _______________________________________________ Estanislado Emms, LCSW 07/10/2018, 1:44 PM

## 2018-07-10 NOTE — Progress Notes (Signed)
Pt placed self on CPAP dream station for the night. RT checked H2O chamber and filled prior to pt application. RT offered assistance and will continue to monitor.

## 2018-07-10 NOTE — Progress Notes (Signed)
  Progress Note    07/10/2018 8:11 AM 9 Days Post-Op  Subjective:  No overnight complaints  Vitals:   07/10/18 0533 07/10/18 0804  BP: 96/65 115/62  Pulse: 64   Resp: 17 16  Temp: 98.6 F (37 C) 99 F (37.2 C)  SpO2: 97% 99%    Physical Exam: Awake alert oriented x3 Left leg incisions healing well, above the knee are closed not draining, below the knee and staples there is minimal drainage from the medial incision There is palpable AT pulse at the ankle Wound VAC to suction over toe amputations 2 through 5 on the left  CBC    Component Value Date/Time   WBC 11.7 (H) 07/04/2018 0347   RBC 3.06 (L) 07/04/2018 0347   HGB 8.2 (L) 07/09/2018 0316   HCT 26.3 (L) 07/09/2018 0316   PLT 228 07/04/2018 0347   MCV 82.4 07/04/2018 0347   MCH 26.8 07/04/2018 0347   MCHC 32.5 07/04/2018 0347   RDW 19.2 (H) 07/04/2018 0347   LYMPHSABS 0.6 (L) 06/27/2018 1045   MONOABS 0.9 06/27/2018 1045   EOSABS 0.1 06/27/2018 1045   BASOSABS 0.0 06/27/2018 1045    BMET    Component Value Date/Time   NA 135 07/09/2018 0316   K 4.4 07/09/2018 0316   CL 101 07/09/2018 0316   CO2 22 07/09/2018 0316   GLUCOSE 155 (H) 07/09/2018 0316   BUN 21 07/09/2018 0316   CREATININE 1.48 (H) 07/09/2018 0316   CALCIUM 9.1 07/09/2018 0316   GFRNONAA 48 (L) 07/09/2018 0316   GFRAA 56 (L) 07/09/2018 0316    INR    Component Value Date/Time   INR 0.93 12/01/2012 1908     Intake/Output Summary (Last 24 hours) at 07/10/2018 0811 Last data filed at 07/10/2018 0533 Gross per 24 hour  Intake 665 ml  Output 2225 ml  Net -1560 ml     Assessment:  68 y.o. male is left SFA to anterior tibial artery bypass and amputation of toes 2 through 5  Plan: Continue Plavix and Xarelto We will follow-up on February 7 for staple removal and wound check Okay for discharge from vascular standpoint    Hailly Fess C. Donzetta Matters, MD [facility] Office: [phone] Pager:  [phone]  07/10/2018 8:11 AM

## 2018-07-10 NOTE — Care Management Note (Signed)
Case Management Note [name] RN, BSN Transitions of Care Unit 4E- RN Case Manager [phone]  Patient Details  Name: [name] MRN: [id number] Date of Birth: March 14, 1951  Subjective/Objective:  Pt admitted with DM foot ulcer, s/p SFA to ant. tib artery bypass with toe amputations per vascular, wound VAC application.                   Action/Plan: PTA pt lived at home with wife, was active with [facility] needs, per PT eval post op recommendations for STSNF- CSW following for placement needs. Will need wound VAC that SNF will arrange once facility picked.   Expected Discharge Date:  07/10/18               Expected Discharge Plan:  [place]  In-House Referral:  Clinical Social Work  Discharge planning Services  CM Consult  Post Acute Care Choice:  Durable Medical Equipment Choice offered to:     DME Arranged:  Vac DME Agency:     HH Arranged:    [place] Agency:  [facility]  Status of Service:  Completed, signed off  If discussed at [employer] of Stay Meetings, dates discussed:    Discharge Disposition: skilled facility   Additional Comments:  07/10/18- 1500- [name] RN, CM- pt has received Josem Kaufmann for [facility] SNF per CSW. [facility] wound VAC being arranged per SNF. CM has notified Hoyle Sauer with [facility] who will f/u with pt at SNF for any further [facility] needs upon d/c from facility.   Dawayne Patricia, RN 07/10/2018, 3:04 PM

## 2018-07-10 NOTE — Discharge Summary (Signed)
Physician Discharge Summary  [name] MPN:[id number] DOB: May 21, 1951 DOA: 06/27/2018  PCP: [name], MD  Admit date: 06/27/2018 Discharge date: 07/10/2018  Admitted From: Home  Disposition:  SNF  Recommendations for Outpatient Follow-up and new medication changes:  1. Follow up with Dr. [name] in 7 days.  2. Plan to continue clopidogrel and rivaroxaban. 3. Follow up with Dr. Donzetta Matters on February 7 for staple removal and wound check.  4. Continue rivaroxaban 15 mg bid until 07/25/2018, then continue with rivaroxaban 20 mg daily.   Home Health: na   Equipment/Devices: wound vac    Discharge Condition: stable  CODE STATUS: full  Diet recommendation: heart healthy and diabetic prudent.   Brief/Interim Summary: 68 year old male who presented with total ulcer.  He does have significant past mental history for hypertension, type 1 diabetes mellitus on an insulin pump, chronic kidney disease stage III, peripheral vascular disease and obstructive sleep apnea.  His 5th toe left foot had gangrene, expected auto- amputation, since October 2019.  2 days prior to hospitalization he was seen by wound care and his 3rd left foot third toe had an ulceration.  He was prescribed antibiotics with no improvement.  Initial physical examination blood pressure 142/90, heart rate 84, temperature 97.4, respiratory rate 20, oxygen saturation 100.  Lungs clear to auscultation, heart S1-S2 present and rhythmic, abdomen soft nontender, lower extremities no edema, positive dry gangrene of the fifth toe, third toe was edematous and cyanotic.  44, potassium 3.7, chloride 86, bicarb 25, glucose 133, BUN 25, creatinine 1.64, white count 11.2, hemoglobin 9.2, hematocrit 30.0, platelets 326.   Patient was admitted to the hospital with a working diagnosis of diabetic left foot infection.  1.  Gangrene/cellulitis/osteomyelitis of the left toes, acute on chronic, (present on admission) related to severe peripheral vascular  disease.  Patient was admitted to the medical ward, he received IV antibiotic therapy, further work-up with MRI of the left foot showed osteonecrosis of the distal phalanx of the little toe, marrow edema in the proximal phalanx of the fifth toe consistent with osteomyelitis.  Bony destruction changes of the tuft of the third toe consistent with osteomyelitis.  Positive subcu gas about the distal phalanx of the third toe consistent with cellulitis.  Vascular surgery was consulted, he underwent harvest left greater saphenous vein, left peroneal artery thrombectomy, left SFA tibial artery bypass with non-reversed greater saphenous vein, left lower extremity angiogram, left SFA to anterior tibial artery bypass with ipsilateral non-reversed greater saphenous vein, amputation left second through fifth.  Wound VAC was placed, antibiotics were discontinued, patient will follow-up at a skilled nursing facility.  2.  Type 1 diabetes mellitus.  Patient is on insulin pump, currently on subcutaneous insulin, glucose seems to be well controlled, at discharge will resume oral hypoglycemic agents.   3.  Acute left popitleal deep vein thrombosis.  Due to edema patient underwent lower extremity Doppler ultrasonography, positive for left leg pain deep vein thrombosis, patient was placed on rivaroxaban with good toleration.  4.  Chronic kidney disease stage III.  Function remained stable, follow-up electrolytes as an outpatient along with kidney function.  5.  Hypertension.  Blood pressure control with carvedilol, Cozaar, hydrochlorothiazide.  6.  Anemia of iron deficiency, chronic.  Patient required 2 units packed red blood cell transfusion for a hemoglobin of 7, no signs of active bleeding, he received IV iron, his hemoglobin has remained stable.   Discharge Diagnoses:  Principal Problem:   Diabetic foot ulcer ([place]) Active Problems:  DM (diabetes mellitus) ([place])   HTN (hypertension)   Claudication in peripheral  vascular disease (HCC)   Benign essential HTN   Anemia   CKD (chronic kidney disease) stage 3, GFR 30-59 ml/min (HCC)   PVD (peripheral vascular disease) ([place])    Discharge Instructions   Allergies as of 07/10/2018   No Known Allergies     Medication List    STOP taking these medications   traMADol 50 MG tablet Commonly known as:  ULTRAM     TAKE these medications   allopurinol 100 MG tablet Commonly known as:  ZYLOPRIM Take 100 mg by mouth daily.   carvedilol 12.5 MG tablet Commonly known as:  COREG Take 12.5 mg by mouth daily at 2 PM.   clobetasol cream 0.05 % Commonly known as:  TEMOVATE Apply 1 application topically 2 (two) times daily as needed (rash).   clopidogrel 75 MG tablet Commonly known as:  PLAVIX Take 75 mg by mouth daily.   colchicine 0.6 MG tablet Take 0.6 mg by mouth 2 (two) times daily.   dorzolamide-timolol 22.3-6.8 MG/ML ophthalmic solution Commonly known as:  COSOPT Place 1 drop into both eyes 2 (two) times daily.   feeding supplement (GLUCERNA SHAKE) Liqd Take 237 mLs by mouth 2 (two) times daily between meals for 30 days.   feeding supplement (PRO-STAT SUGAR FREE 64) Liqd Take 30 mLs by mouth daily for 30 days. Start taking on:  July 11, 2018   insulin aspart 100 UNIT/ML injection Commonly known as:  novoLOG Inject 0-15 Units into the skin 3 (three) times daily with meals. For glucose 120-150 please use 2 units, for 151 to 200 use 3 units, for 201 to 250 use 5 units, for 251 to 300 use 8 units, for 301 to 350 use 12 units, for 351 or greater use 15 units. What changed:    how much to take  when to take this  additional instructions   insulin glargine 100 UNIT/ML injection Commonly known as:  LANTUS Inject 0.2 mLs (20 Units total) into the skin at bedtime for 30 days.   l-methylfolate-B6-B12 3-35-2 MG Tabs tablet Commonly known as:  METANX Take 1 tablet by mouth 2 (two) times daily.   losartan 100 MG tablet Commonly  known as:  COZAAR Take 100 mg by mouth daily at 2 PM.   metFORMIN 1000 MG tablet Commonly known as:  GLUCOPHAGE Take 1 tablet (1,000 mg total) by mouth 2 (two) times daily with a meal. What changed:  when to take this   multivitamin with minerals Tabs tablet Take 1 tablet by mouth 2 (two) times daily.   oxyCODONE-acetaminophen 5-325 MG tablet Commonly known as:  PERCOCET/ROXICET Take 1 tablet by mouth every 4 (four) hours as needed for moderate pain or severe pain. What changed:    when to take this  reasons to take this   pantoprazole 40 MG tablet Commonly known as:  PROTONIX Take 1 tablet (40 mg total) by mouth daily for 15 days. Start taking on:  July 11, 2018   pravastatin 40 MG tablet Commonly known as:  PRAVACHOL Take 40 mg by mouth at bedtime.   pregabalin 50 MG capsule Commonly known as:  LYRICA Take 50-100 mg by mouth at bedtime.   Rivaroxaban 15 MG Tabs tablet Commonly known as:  XARELTO Take 1 tablet (15 mg total) by mouth 2 (two) times daily with a meal for 14 days. Last dose on 07/24/18, then continue with 20 mg daily. What changed:  medication strength  how much to take  when to take this  additional instructions   rivaroxaban 20 MG Tabs tablet Commonly known as:  XARELTO Take 1 tablet (20 mg total) by mouth daily with supper for 30 days. Please start in 07/25/2018 Start taking on:  July 25, 2018 What changed:  You were already taking a medication with the same name, and this prescription was added. Make sure you understand how and when to take each.   TRAVATAN Z 0.004 % Soln ophthalmic solution Generic drug:  Travoprost (BAK Free) Place 1 drop into both eyes at bedtime.   triamterene-hydrochlorothiazide 37.5-25 MG tablet Commonly known as:  MAXZIDE-25 Take 1 tablet by mouth daily at 2 PM.   V-GO 30 Kit See admin instructions. Uses Novolog insulin in pump   VICTOZA 18 MG/3ML Sopn Generic drug:  liraglutide Inject 0.2 mLs (1.2 mg  total) into the skin daily. Inject once daily at the same time What changed:  when to take this       Contact information for follow-up providers    Waynetta Sandy, MD In 3 weeks.   Specialties:  Vascular Surgery, Cardiology Why:  Office will call you to arrange your appt (sent) Contact information: [place] [address] [phone]        [name], MD Follow up.   Specialty:  Internal Medicine Contact information: [address] [place]  [address] [phone]            Contact information for after-discharge care    Destination    [facility] .   Service:  Skilled Nursing Contact information: [address] [place] [phone]                 No Known Allergies  Consultations:  Vascular surgery     Procedures/Studies: Mr Foot Left Wo Contrast  Result Date: 06/29/2018 CLINICAL DATA:  Diabetic patient with a skin ulceration on the third toe of the left foot. Left foot pain, redness and swelling. The patient's little toe appears necrotic. EXAM: MRI OF THE LEFT FOOT WITHOUT CONTRAST TECHNIQUE: Multiplanar, multisequence MR imaging of the left foot was performed. No intravenous contrast was administered. COMPARISON:  Plain films left foot 06/23/2018. FINDINGS: Bones/Joint/Cartilage Markedly decreased T1 and T2 signal throughout the distal phalanx of the little toe are most consistent with osteonecrosis. There is increased T2 signal throughout the proximal phalanx of the great toe consistent with osteomyelitis. The distal phalanx of the third toe is not well seen but there appears to be bony destructive change of the tuft. Innumerable punctate foci of decreased T1 and T2 signal in soft tissues about the distal phalanx are consistent with the presence of gas. The third toe is otherwise normal in appearance. Mild hallux valgus and mild first MTP osteoarthritis are seen. Imaged bones  otherwise appear normal. Ligaments Intact. Muscles and Tendons Marked atrophy of intrinsic musculature the foot is identified. No intramuscular fluid collection. Soft tissues Diffuse subcutaneous edema is present about the dorsum of the foot. IMPRESSION: Markedly decreased T1 and T2 signal throughout the distal phalanx of the little toe likely due to osteonecrosis. Marrow edema in the proximal phalanx of the fifth toe is consistent with osteomyelitis. Bony destructive change in the tuft of the third toe consistent with osteomyelitis. No other evidence of osteomyelitis is identified. Soft tissue gas about the distal phalanx of the third toe consistent with skin ulceration and cellulitis. Negative for abscess or  septic joint. Diffuse subcutaneous edema about the foot most consistent with cellulitis. Electronically Signed   By: Inge Rise M.D.   On: 06/29/2018 13:09   Dg Ang/ext/uni/or Left  Result Date: 07/01/2018 CLINICAL DATA:  68 year old male with a history of critical limb ischemia EXAM: LEFT ANG/EXT/UNI/ OR CONTRAST:  Op note FLUOROSCOPY TIME:  Op note COMPARISON:  None. FINDINGS: Limited intraoperative angiogram images of left lower extremity demonstrating partial opacification of the tibial vasculature. IMPRESSION: Limited intraoperative fluoroscopic images of left-sided distal bypass. Please refer to the dictated operative report for full details of intraoperative findings and procedure. Electronically Signed   By: Corrie Mckusick D.O.   On: 07/01/2018 16:32   Dg Foot Complete Left  Result Date: 06/23/2018 CLINICAL DATA:  Left foot ulcers. EXAM: LEFT FOOT - COMPLETE 3+ VIEW COMPARISON:  None. FINDINGS: Old distal left fifth metatarsal fracture is noted. Large soft tissue ulceration is seen involving distal portion of third toe with underlying lytic destruction of the distal tuft of third distal phalanx consistent with osteomyelitis. Joint spaces are intact. IMPRESSION: Soft tissue ulceration  involving distal soft tissues of third toe with underlying lytic destruction of distal tuft of third distal phalanx consistent with osteomyelitis. Electronically Signed   By: Marijo Conception, M.D.   On: 06/23/2018 15:54   Vas [place] Burnard Bunting With/wo Tbi  Result Date: 07/03/2018 LOWER EXTREMITY DOPPLER STUDY Indications: Peripheral artery disease, and PO BPG.  Comparison Study: 05/02/18 Performing Technologist: Carlos Levering RVT  Examination Guidelines: A complete evaluation includes at minimum, Doppler waveform signals and systolic blood pressure reading at the level of bilateral brachial, anterior tibial, and posterior tibial arteries, when vessel segments are accessible. Bilateral testing is considered an integral part of a complete examination. Photoelectric Plethysmograph (PPG) waveforms and toe systolic pressure readings are included as required and additional duplex testing as needed. Limited examinations for reoccurring indications may be performed as noted.  ABI Findings: +---------+------------------+-----+---------+--------+ Right    Rt Pressure (mmHg)IndexWaveform Comment  +---------+------------------+-----+---------+--------+ Brachial 133                    triphasic         +---------+------------------+-----+---------+--------+ PTA      117               0.88 biphasic          +---------+------------------+-----+---------+--------+ DP       145               1.09 triphasic         +---------+------------------+-----+---------+--------+ Great Toe87                0.65                   +---------+------------------+-----+---------+--------+ +---------+------------------+-----+----------+-------+ Left     Lt Pressure (mmHg)IndexWaveform  Comment +---------+------------------+-----+----------+-------+ Brachial 112                    triphasic         +---------+------------------+-----+----------+-------+ PTA      100               0.75 monophasic         +---------+------------------+-----+----------+-------+ DP       115               0.86 monophasic        +---------+------------------+-----+----------+-------+ Su Hoff  0.31                   +---------+------------------+-----+----------+-------+ +-------+-----------+-----------+------------+------------+ ABI/TBIToday's ABIToday's TBIPrevious ABIPrevious TBI +-------+-----------+-----------+------------+------------+ Right  1.09       0.65       0.82                     +-------+-----------+-----------+------------+------------+ Left   0.86       0.31       0.43                     +-------+-----------+-----------+------------+------------+  Summary: Right: Resting right ankle-brachial index is within normal range. No evidence of significant right lower extremity arterial disease. The right toe-brachial index is abnormal. Left: Resting left ankle-brachial index indicates mild left lower extremity arterial disease. The left toe-brachial index is abnormal.  *See table(s) above for measurements and observations.  Electronically signed by Quay Burow MD on 07/03/2018 at 6:06:05 PM.   Final    Vas [place] Lower Extremity Saphenous Vein Mapping  Result Date: 06/30/2018 LOWER EXTREMITY VEIN MAPPING Indications: PAD  Performing Technologist: Oliver Hum RVT  Examination Guidelines: A complete evaluation includes B-mode imaging, spectral Doppler, color Doppler, and power Doppler as needed of all accessible portions of each vessel. Bilateral testing is considered an integral part of a complete examination. Limited examinations for reoccurring indications may be performed as noted. +---------------+-----------+----------------------+---------------+-----------+   RT Diameter  RT Findings         GSV            LT Diameter  LT Findings      (cm)                                            (cm)                   +---------------+-----------+----------------------+---------------+-----------+      0.41                     Saphenofemoral         0.35                                                   Junction                                  +---------------+-----------+----------------------+---------------+-----------+      0.29                     Proximal thigh         0.56       branching  +---------------+-----------+----------------------+---------------+-----------+      0.28                       Mid thigh            0.47       branching  +---------------+-----------+----------------------+---------------+-----------+      0.23       branching      Distal thigh          0.51       branching  +---------------+-----------+----------------------+---------------+-----------+  0.21                          Knee              0.57       branching  +---------------+-----------+----------------------+---------------+-----------+      0.09                       Prox calf            0.45       branching  +---------------+-----------+----------------------+---------------+-----------+      0.08       branching        Mid calf            0.22                  +---------------+-----------+----------------------+---------------+-----------+      0.16                      Distal calf           0.31       branching  +---------------+-----------+----------------------+---------------+-----------+ Diagnosing physician: Monica Martinez MD Electronically signed by Monica Martinez MD on 06/30/2018 at 4:18:13 PM.    Final    Vas [place] Lower Extremity Arterial Duplex  Result Date: 06/29/2018 LOWER EXTREMITY ARTERIAL DUPLEX STUDY Indications: Unsuccessful revascularization 05/09/18 previous SFA distal and              popliteal stenting with stent thrombosis.  Current ABI: Previous ABI 05/02/18 Right 0.82 Left 0.43 Performing Technologist: June Leap RDMS, RVT  Examination  Guidelines: A complete evaluation includes B-mode imaging, spectral Doppler, color Doppler, and power Doppler as needed of all accessible portions of each vessel. Bilateral testing is considered an integral part of a complete examination. Limited examinations for reoccurring indications may be performed as noted.  Left Duplex Findings: +-----------+--------+-----+--------+----------+-------------------------------+            PSV cm/sRatioStenosisWaveform  Comments                        +-----------+--------+-----+--------+----------+-------------------------------+ CFA Prox   89                   biphasic                                  +-----------+--------+-----+--------+----------+-------------------------------+ DFA        62                   biphasic                                  +-----------+--------+-----+--------+----------+-------------------------------+ SFA Prox   101                  biphasic                                  +-----------+--------+-----+--------+----------+-------------------------------+ SFA Mid    74                   biphasic                                  +-----------+--------+-----+--------+----------+-------------------------------+  SFA Distal 52                   biphasic  proximal part of stent is                                                 patent, collateral vessel is                                              noted just above where stent                                              occludes                        +-----------+--------+-----+--------+----------+-------------------------------+ POP Prox                                  occluded stent                  +-----------+--------+-----+--------+----------+-------------------------------+ POP Distal                                occluded stent                   +-----------+--------+-----+--------+----------+-------------------------------+ ATA Prox   44                   monophasic                                +-----------+--------+-----+--------+----------+-------------------------------+ ATA Mid                                   unable to image due to bandages +-----------+--------+-----+--------+----------+-------------------------------+ PTA Mid                         monophasic                                +-----------+--------+-----+--------+----------+-------------------------------+ PTA Distal 41                   monophasic                                +-----------+--------+-----+--------+----------+-------------------------------+ PERO Mid                        occluded                                  +-----------+--------+-----+--------+----------+-------------------------------+ PERO Distal  occluded                                  +-----------+--------+-----+--------+----------+-------------------------------+  Summary: Left: Left popliteal stent is occluded. Left peroneal artery is occluded. Details mentioned above. Incidental finding: left popliteal vein DVT.   See table(s) above for measurements and observations. Electronically signed by Ruta Hinds MD on 06/29/2018 at 12:35:35 PM.    Final        Subjective: Patient continue to have left foot pain, partially controlled with analgesics, no nausea or vomiting, no chest pain or dyspnea. Has been working with physical therapy, now out of bed to chair.   Discharge Exam: Vitals:   07/10/18 1129 07/10/18 1354  BP: 135/65 123/73  Pulse: 80 67  Resp:    Temp:    SpO2:     Vitals:   07/10/18 0900 07/10/18 1100 07/10/18 1129 07/10/18 1354  BP:   135/65 123/73  Pulse: 67  80 67  Resp: 19 (!) 31    Temp:      TempSrc:      SpO2: 97%     Weight:      Height:        General: Not in pain or dyspnea  Neurology: Awake and  alert, non focal  E ENT: mild pallor, no icterus, oral mucosa moist Cardiovascular: No JVD. S1-S2 present, rhythmic, no gallops, rubs, or murmurs. Trace bilateral lower extremity edema. Pulmonary: vesicular breath sounds bilaterally, adequate air movement, no wheezing, rhonchi or rales. Gastrointestinal. Abdomen with no organomegaly, non tender, no rebound or guarding Skin. Right foot with wound vac in place, surgical wound clean.  Musculoskeletal: no joint deformities   The results of significant diagnostics from this hospitalization (including imaging, microbiology, ancillary and laboratory) are listed below for reference.     Microbiology: No results found for this or any previous visit (from the past 240 hour(s)).   Labs: BNP (last 3 results) No results for input(s): BNP in the last 8760 hours. Basic Metabolic Panel: Recent Labs  Lab 07/04/18 0347 07/05/18 0307 07/06/18 0652 07/09/18 0316  NA 133* 134* 135 135  K 3.9 3.8 3.7 4.4  CL 105 104 103 101  CO2 21* 21* 23 22  GLUCOSE 168* 177* 137* 155*  BUN 19 19 16 21   CREATININE 1.44* 1.29* 1.33* 1.48*  CALCIUM 8.2* 8.4* 8.7* 9.1  MG  --   --  2.0  --    Liver Function Tests: No results for input(s): AST, ALT, ALKPHOS, BILITOT, PROT, ALBUMIN in the last 168 hours. No results for input(s): LIPASE, AMYLASE in the last 168 hours. No results for input(s): AMMONIA in the last 168 hours. CBC: Recent Labs  Lab 07/04/18 0347 07/05/18 0307 07/06/18 0320 07/07/18 0652 07/09/18 0316  WBC 11.7*  --   --   --   --   HGB 8.2* 7.8* 8.2* 8.2* 8.2*  HCT 25.2* 24.5* 25.5* 25.5* 26.3*  MCV 82.4  --   --   --   --   PLT 228  --   --   --   --    Cardiac Enzymes: No results for input(s): CKTOTAL, CKMB, CKMBINDEX, TROPONINI in the last 168 hours. BNP: Invalid input(s): POCBNP CBG: Recent Labs  Lab 07/09/18 1114 07/09/18 1554 07/09/18 2106 07/10/18 0623 07/10/18 1125  GLUCAP 171* 166* 192* 117* 230*   D-Dimer No results  for input(s): DDIMER in the last 72  hours. Hgb A1c No results for input(s): HGBA1C in the last 72 hours. Lipid Profile No results for input(s): CHOL, HDL, LDLCALC, TRIG, CHOLHDL, LDLDIRECT in the last 72 hours. Thyroid function studies No results for input(s): TSH, T4TOTAL, T3FREE, THYROIDAB in the last 72 hours.  Invalid input(s): FREET3 Anemia work up No results for input(s): VITAMINB12, FOLATE, FERRITIN, TIBC, IRON, RETICCTPCT in the last 72 hours. Urinalysis    Component Value Date/Time   COLORURINE YELLOW 04/29/2018 0820   APPEARANCEUR CLEAR 04/29/2018 0820   LABSPEC 1.013 04/29/2018 0820   PHURINE 5.0 04/29/2018 0820   GLUCOSEU 50 (A) 04/29/2018 0820   HGBUR NEGATIVE 04/29/2018 0820   BILIRUBINUR NEGATIVE 04/29/2018 0820   KETONESUR NEGATIVE 04/29/2018 0820   PROTEINUR NEGATIVE 04/29/2018 0820   UROBILINOGEN 0.2 12/01/2012 2135   NITRITE NEGATIVE 04/29/2018 0820   LEUKOCYTESUR NEGATIVE 04/29/2018 0820   Sepsis Labs Invalid input(s): PROCALCITONIN,  WBC,  LACTICIDVEN Microbiology No results found for this or any previous visit (from the past 240 hour(s)).   Time coordinating discharge: 45 minutes  SIGNED:   Tawni Millers, MD  [facility] 07/10/2018, 2:06 PM Pager [phone]  If 7PM-7AM, please contact night-coverage [email or web address] Password [email or web address]

## 2018-07-24 DIAGNOSIS — E1151 Type 2 diabetes mellitus with diabetic peripheral angiopathy without gangrene: Secondary | ICD-10-CM | POA: Diagnosis not present

## 2018-07-24 DIAGNOSIS — M86072 Acute hematogenous osteomyelitis, left ankle and foot: Secondary | ICD-10-CM | POA: Diagnosis not present

## 2018-07-24 DIAGNOSIS — E1169 Type 2 diabetes mellitus with other specified complication: Secondary | ICD-10-CM | POA: Diagnosis not present

## 2018-07-24 DIAGNOSIS — E11621 Type 2 diabetes mellitus with foot ulcer: Secondary | ICD-10-CM | POA: Insufficient documentation

## 2018-07-24 DIAGNOSIS — L97822 Non-pressure chronic ulcer of other part of left lower leg with fat layer exposed: Secondary | ICD-10-CM | POA: Diagnosis not present

## 2018-07-24 DIAGNOSIS — L97522 Non-pressure chronic ulcer of other part of left foot with fat layer exposed: Secondary | ICD-10-CM | POA: Insufficient documentation

## 2018-07-24 DIAGNOSIS — E1140 Type 2 diabetes mellitus with diabetic neuropathy, unspecified: Secondary | ICD-10-CM | POA: Insufficient documentation

## 2018-07-24 DIAGNOSIS — G4730 Sleep apnea, unspecified: Secondary | ICD-10-CM | POA: Diagnosis not present

## 2018-07-25 ENCOUNTER — Telehealth: Payer: Self-pay

## 2018-07-25 NOTE — Telephone Encounter (Signed)
Wound Care nurse at [facility] called to inform [name] that the incision on his inner calf had been weeping a little, and there was some edema in the lower leg and slight redness at that incision. The NP and MD at rehab looked at the patient and started him on Keflex. Patient has a post-op appt on 2/7. They will continue to monitor and if there are changes they will call to move his appointment up.

## 2018-08-01 ENCOUNTER — Ambulatory Visit (INDEPENDENT_AMBULATORY_CARE_PROVIDER_SITE_OTHER): Payer: Self-pay | Admitting: Vascular Surgery

## 2018-08-01 ENCOUNTER — Ambulatory Visit (INDEPENDENT_AMBULATORY_CARE_PROVIDER_SITE_OTHER): Payer: Self-pay | Admitting: Physician Assistant

## 2018-08-01 ENCOUNTER — Encounter: Payer: Self-pay | Admitting: Vascular Surgery

## 2018-08-01 ENCOUNTER — Other Ambulatory Visit: Payer: Self-pay

## 2018-08-01 VITALS — BP 117/76 | HR 77 | Temp 98.2°F | Resp 18 | Ht 73.0 in | Wt 230.0 lb

## 2018-08-01 DIAGNOSIS — I739 Peripheral vascular disease, unspecified: Secondary | ICD-10-CM

## 2018-08-01 NOTE — Progress Notes (Signed)
    Subjective:     Patient ID: [name], male   DOB: 06-22-1951, 68 y.o.   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  He now has a wound VAC at his rehab facility.  He is on Xarelto and Plavix.  Plan will be to go home soon  Review of Systems Left foot wound healing well    Objective:   Physical Exam Awake alert oriented Palpable anterior tibial pulse at the ankle Wound is beefy red and was debrided today at bedside with significant bleeding    Assessment:     68 year old male status post the above operation.  I debridement his wound today to healthy bleeding tissue.  Wound VAC will be replaced.    Plan:     Follow-up in 3 to 4 weeks for wound check.   We will need to get him set up for duplex and ABIs 3 months after the surgery.     Charina Fons C. Donzetta Matters, MD [facility] [place] Office: [phone] Pager: [phone]

## 2018-08-06 ENCOUNTER — Other Ambulatory Visit: Payer: Self-pay

## 2018-08-06 ENCOUNTER — Ambulatory Visit (INDEPENDENT_AMBULATORY_CARE_PROVIDER_SITE_OTHER): Payer: Self-pay | Admitting: Physician Assistant

## 2018-08-06 VITALS — BP 103/68 | HR 67 | Temp 97.1°F | Resp 16 | Ht 74.0 in | Wt 240.0 lb

## 2018-08-06 DIAGNOSIS — I739 Peripheral vascular disease, unspecified: Secondary | ICD-10-CM

## 2018-08-06 DIAGNOSIS — L03116 Cellulitis of left lower limb: Secondary | ICD-10-CM

## 2018-08-06 MED ORDER — CEPHALEXIN 500 MG PO CAPS: 500.0000 mg | ORAL_CAPSULE | Freq: Four times a day (QID) | ORAL | 0 refills | Status: DC

## 2018-08-06 NOTE — Progress Notes (Signed)
POST OPERATIVE OFFICE NOTE    CC:  F/u for surgery  HPI:  68 year old male has undergone stenting of the left popliteal artery that subsequently occluded he is undergone attempted percutaneous thrombectomy on multiple occasions with no success.  He now has gangrenous changes to his left toes and is indicated for bypass with possible amputation of affected toes. Procedure Performed: 1.  Harvest left greater saphenous vein 2.  Left peroneal artery thrombectomy 3.  Left SFA to peroneal artery bypass with non-reversed greater saphenous vein 4.  Left lower extremity angiogram 5.  Left SFA to anterior tibial artery bypass with ipsilateral non-reversed greater saphenous vein 6.  Amputation left toes 2 through 5    Findings: The SFA was patent to the level of the mid thigh just cephalad to the existing stent.  Distally there was significant venous stasis changes and significant bleeding from each of his tibial veins.  After bypass the peroneal artery we did not have any runoff angiogram demonstrated anterior tibial artery was actually a dominant runoff.  At this time we expose the anterior tibial artery performed bypass.  At completion we did have palpable anterior tibial pulse distally with great signal in the foot on the DP.  Toes 2 through 5 were grossly infected there was purulence.  These were amputated and we debrided back to healthy tissue where we had adequate bleeding to expect healing.   He called with a report from his [facility] RN that there are changes at the incision on his inner calf had been weeping a little, and there was some edema in the lower leg and slight redness at that incision. There is now since Sunday an open area of drainage at the above knee incision with erythema and medial thigh edema that is firm to touch.  He had a low grad temp of 99.  He denies fever today and no chills.      No Known Allergies  Current Outpatient Medications  Medication Sig Dispense Refill  .  allopurinol (ZYLOPRIM) 100 MG tablet Take 100 mg by mouth daily.    . Amino Acids-Protein Hydrolys (FEEDING SUPPLEMENT, PRO-STAT SUGAR FREE 64,) LIQD Take 30 mLs by mouth daily for 30 days. 900 mL 0  . carvedilol (COREG) 12.5 MG tablet Take 12.5 mg by mouth daily at 2 PM.     . clobetasol cream (TEMOVATE) 1.61 % Apply 1 application topically 2 (two) times daily as needed (rash).    . clopidogrel (PLAVIX) 75 MG tablet Take 75 mg by mouth daily.    . colchicine 0.6 MG tablet Take 0.6 mg by mouth 2 (two) times daily.    . dorzolamide-timolol (COSOPT) 22.3-6.8 MG/ML ophthalmic solution Place 1 drop into both eyes 2 (two) times daily.  3  . feeding supplement, GLUCERNA SHAKE, (GLUCERNA SHAKE) LIQD Take 237 mLs by mouth 2 (two) times daily between meals for 30 days. 14220 mL 0  . insulin aspart (NOVOLOG) 100 UNIT/ML injection Inject 0-15 Units into the skin 3 (three) times daily with meals. For glucose 120-150 please use 2 units, for 151 to 200 use 3 units, for 201 to 250 use 5 units, for 251 to 300 use 8 units, for 301 to 350 use 12 units, for 351 or greater use 15 units. 10 mL 11  . Insulin Disposable Pump (V-GO 30) KIT See admin instructions. Uses Novolog insulin in pump  6  . insulin glargine (LANTUS) 100 UNIT/ML injection Inject 0.2 mLs (20 Units total) into the skin at  bedtime for 30 days. 6 mL 0  . l-methylfolate-B6-B12 (METANX) 3-35-2 MG TABS tablet Take 1 tablet by mouth 2 (two) times daily.    Marland Kitchen losartan (COZAAR) 100 MG tablet Take 100 mg by mouth daily at 2 PM.    . metFORMIN (GLUCOPHAGE) 1000 MG tablet Take 1 tablet (1,000 mg total) by mouth 2 (two) times daily with a meal. (Patient taking differently: Take 1,000 mg by mouth daily. )  5  . Multiple Vitamin (MULTIVITAMIN WITH MINERALS) TABS tablet Take 1 tablet by mouth 2 (two) times daily.    Marland Kitchen oxyCODONE-acetaminophen (PERCOCET/ROXICET) 5-325 MG tablet Take 1 tablet by mouth every 4 (four) hours as needed for moderate pain or severe pain. 20  tablet 0  . pravastatin (PRAVACHOL) 40 MG tablet Take 40 mg by mouth at bedtime.   11  . pregabalin (LYRICA) 50 MG capsule Take 50-100 mg by mouth at bedtime.    . rivaroxaban (XARELTO) 20 MG TABS tablet Take 1 tablet (20 mg total) by mouth daily with supper for 30 days. Please start in 07/25/2018 30 tablet 0  . TRAVATAN Z 0.004 % SOLN ophthalmic solution Place 1 drop into both eyes at bedtime.   2  . triamterene-hydrochlorothiazide (MAXZIDE-25) 37.5-25 MG per tablet Take 1 tablet by mouth daily at 2 PM.     . VICTOZA 18 MG/3ML SOPN Inject 0.2 mLs (1.2 mg total) into the skin daily. Inject once daily at the same time (Patient taking differently: Inject 1.2 mg into the skin at bedtime. Inject once daily at the same time) 2 pen 3  . cephALEXin (KEFLEX) 500 MG capsule Take 1 capsule (500 mg total) by mouth 4 (four) times daily. 40 capsule 0  . pantoprazole (PROTONIX) 40 MG tablet Take 1 tablet (40 mg total) by mouth daily for 15 days. 15 tablet 0  . Rivaroxaban (XARELTO) 15 MG TABS tablet Take 1 tablet (15 mg total) by mouth 2 (two) times daily with a meal for 14 days. Last dose on 07/24/18, then continue with 20 mg daily. 28 tablet 0   No current facility-administered medications for this visit.      ROS:  See HPI  Physical Exam:    Incision:  Above knee popliteal incision Peau d'orange skin appearence medial thigh to groin edema.  2 cm x 1.5 cm opening in the distal incision with SS drainage. No malodor and no purulence.  1 cm deep probe with q-tip venous blood drainage, possible old hematoma.  Ace wrap and wound vac in place lower leg.     Assessment/Plan:  This is a 68 y.o. male who is s/p: Procedure Performed: 1.  Harvest left greater saphenous vein 2.  Left peroneal artery thrombectomy 3.  Left SFA to peroneal artery bypass with non-reversed greater saphenous vein 4.  Left lower extremity angiogram 5.  Left SFA to anterior tibial artery bypass with ipsilateral non-reversed greater  saphenous vein 6.  Amputation left toes 2 through 5  Wet to dry packing open area popliteal incision with light ace wrap.  We discussed elevation to help with edema.  I faxed a prescription for Keflex 500 mg QID and he is going to f/u again on Friday when Dr. Donzetta Matters is here.     [name] PA-C [facility] [phone]  Clinic MD:  Oneida Alar

## 2018-08-08 ENCOUNTER — Other Ambulatory Visit: Payer: Self-pay

## 2018-08-08 ENCOUNTER — Ambulatory Visit (INDEPENDENT_AMBULATORY_CARE_PROVIDER_SITE_OTHER): Payer: Self-pay | Admitting: Physician Assistant

## 2018-08-08 ENCOUNTER — Encounter: Payer: Self-pay | Admitting: Physician Assistant

## 2018-08-08 VITALS — BP 135/88 | HR 79 | Temp 97.6°F | Resp 18 | Ht 74.0 in | Wt 234.8 lb

## 2018-08-08 DIAGNOSIS — I739 Peripheral vascular disease, unspecified: Secondary | ICD-10-CM

## 2018-08-08 DIAGNOSIS — L03116 Cellulitis of left lower limb: Secondary | ICD-10-CM

## 2018-08-08 NOTE — Progress Notes (Signed)
POST OPERATIVE OFFICE NOTE    CC:  F/u for surgery  HPI: HPI:  68 year old male has undergone stenting of the left popliteal artery that subsequently occluded he is undergone attempted percutaneous thrombectomy on multiple occasions with no success. He now has gangrenous changes to his left toes and is indicated for bypass with possible amputation of affected toes. Procedure Performed: 1.Harvest left greater saphenous vein 2.Left peroneal artery thrombectomy 3.Left SFA to peroneal artery bypass with non-reversed greater saphenous vein 4.Left lower extremity angiogram 5.Left SFA to anterior tibial artery bypass with ipsilateral non-reversed greater saphenous vein 6. Amputation left toes 2 through 5                          Findings:The SFA was patent to the level of the mid thigh just cephalad to the existing stent. Distally there was significant venous stasis changes and significant bleeding from each of his tibial veins. After bypass the peroneal artery we did not have any runoff angiogram demonstrated anterior tibial artery was actually a dominant runoff. At this time we expose the anterior tibial artery performed bypass. At completion we did have palpable anterior tibial pulse distally with great signal in the foot on the DP. Toes 2 through 5 were grossly infected there was purulence. These were amputated and we debrided back to healthy tissue where we had adequate bleeding to expect healing.  He was seen 08/06/2018 for above the knee vein harvest site dehiscence with SS drainage.  He has significant chronic venous insufficiency and now with increased inflow this created an over load of edema in the left LE.  He was placed on oral Keflex and elevation with dry dressing changes PRN.    He states the erythema has decreased over the past few days and he denies fever and chills.  He showed [place] pictures of the left foot toe amputation suite which has a beefy red base and appears to  be healing well.    No Known Allergies  Current Outpatient Medications  Medication Sig Dispense Refill  . allopurinol (ZYLOPRIM) 100 MG tablet Take 100 mg by mouth daily.    . Amino Acids-Protein Hydrolys (FEEDING SUPPLEMENT, PRO-STAT SUGAR FREE 64,) LIQD Take 30 mLs by mouth daily for 30 days. 900 mL 0  . carvedilol (COREG) 12.5 MG tablet Take 12.5 mg by mouth daily at 2 PM.     . cephALEXin (KEFLEX) 500 MG capsule Take 1 capsule (500 mg total) by mouth 4 (four) times daily. 40 capsule 0  . clobetasol cream (TEMOVATE) 2.45 % Apply 1 application topically 2 (two) times daily as needed (rash).    . clopidogrel (PLAVIX) 75 MG tablet Take 75 mg by mouth daily.    . colchicine 0.6 MG tablet Take 0.6 mg by mouth 2 (two) times daily.    . dorzolamide-timolol (COSOPT) 22.3-6.8 MG/ML ophthalmic solution Place 1 drop into both eyes 2 (two) times daily.  3  . feeding supplement, GLUCERNA SHAKE, (GLUCERNA SHAKE) LIQD Take 237 mLs by mouth 2 (two) times daily between meals for 30 days. 14220 mL 0  . insulin aspart (NOVOLOG) 100 UNIT/ML injection Inject 0-15 Units into the skin 3 (three) times daily with meals. For glucose 120-150 please use 2 units, for 151 to 200 use 3 units, for 201 to 250 use 5 units, for 251 to 300 use 8 units, for 301 to 350 use 12 units, for 351 or greater use 15 units. 10 mL 11  .  Insulin Disposable Pump (V-GO 30) KIT See admin instructions. Uses Novolog insulin in pump  6  . insulin glargine (LANTUS) 100 UNIT/ML injection Inject 0.2 mLs (20 Units total) into the skin at bedtime for 30 days. 6 mL 0  . l-methylfolate-B6-B12 (METANX) 3-35-2 MG TABS tablet Take 1 tablet by mouth 2 (two) times daily.    Marland Kitchen losartan (COZAAR) 100 MG tablet Take 100 mg by mouth daily at 2 PM.    . metFORMIN (GLUCOPHAGE) 1000 MG tablet Take 1 tablet (1,000 mg total) by mouth 2 (two) times daily with a meal. (Patient taking differently: Take 1,000 mg by mouth daily. )  5  . Multiple Vitamin (MULTIVITAMIN WITH  MINERALS) TABS tablet Take 1 tablet by mouth 2 (two) times daily.    Marland Kitchen oxyCODONE-acetaminophen (PERCOCET/ROXICET) 5-325 MG tablet Take 1 tablet by mouth every 4 (four) hours as needed for moderate pain or severe pain. 20 tablet 0  . pravastatin (PRAVACHOL) 40 MG tablet Take 40 mg by mouth at bedtime.   11  . pregabalin (LYRICA) 50 MG capsule Take 50-100 mg by mouth at bedtime.    . rivaroxaban (XARELTO) 20 MG TABS tablet Take 1 tablet (20 mg total) by mouth daily with supper for 30 days. Please start in 07/25/2018 30 tablet 0  . TRAVATAN Z 0.004 % SOLN ophthalmic solution Place 1 drop into both eyes at bedtime.   2  . triamterene-hydrochlorothiazide (MAXZIDE-25) 37.5-25 MG per tablet Take 1 tablet by mouth daily at 2 PM.     . VICTOZA 18 MG/3ML SOPN Inject 0.2 mLs (1.2 mg total) into the skin daily. Inject once daily at the same time (Patient taking differently: Inject 1.2 mg into the skin at bedtime. Inject once daily at the same time) 2 pen 3  . pantoprazole (PROTONIX) 40 MG tablet Take 1 tablet (40 mg total) by mouth daily for 15 days. 15 tablet 0  . Rivaroxaban (XARELTO) 15 MG TABS tablet Take 1 tablet (15 mg total) by mouth 2 (two) times daily with a meal for 14 days. Last dose on 07/24/18, then continue with 20 mg daily. 28 tablet 0   No current facility-administered medications for this visit.      ROS:  See HPI  Physical Exam:    Incision:  Above the knee incision with SS drainage, moderate edema with cellulitis  2 cm x 1.5 cm opening in the distal incision with SS drainage. No malodor and no purulence.   Assessment/Plan:  This is a 68 y.o. male who is s/p: Procedure Performed: 1.Harvest left greater saphenous vein 2.Left peroneal artery thrombectomy 3.Left SFA to peroneal artery bypass with non-reversed greater saphenous vein 4.Left lower extremity angiogram 5.Left SFA to anterior tibial artery bypass with ipsilateral non-reversed greater saphenous vein 6. Amputation  left toes 2 through 5  Continue Keflex QID, elevation and dry dressing changes daily until the drainage stops.  Continue wound vac to amputation site.  LE wrap for edema and heel weight bearing in darco shoe.  He will keep his appt. In 3 weeks with DR. Donzetta Matters.    [name] PA-C [facility] [phone]  Clinic MD:  Donzetta Matters

## 2018-08-18 ENCOUNTER — Other Ambulatory Visit: Payer: Self-pay | Admitting: Surgery

## 2018-08-18 DIAGNOSIS — L03116 Cellulitis of left lower limb: Secondary | ICD-10-CM

## 2018-08-18 MED ORDER — CEPHALEXIN 500 MG PO CAPS: 500.0000 mg | ORAL_CAPSULE | Freq: Four times a day (QID) | ORAL | 0 refills | Status: DC

## 2018-08-29 ENCOUNTER — Other Ambulatory Visit: Payer: Self-pay

## 2018-08-29 ENCOUNTER — Ambulatory Visit (INDEPENDENT_AMBULATORY_CARE_PROVIDER_SITE_OTHER): Payer: Self-pay | Admitting: Vascular Surgery

## 2018-08-29 ENCOUNTER — Encounter: Payer: Self-pay | Admitting: Vascular Surgery

## 2018-08-29 VITALS — BP 120/76 | HR 63 | Temp 97.0°F | Resp 20 | Ht 74.0 in | Wt 236.1 lb

## 2018-08-29 DIAGNOSIS — I739 Peripheral vascular disease, unspecified: Secondary | ICD-10-CM

## 2018-08-29 NOTE — Progress Notes (Signed)
    Subjective:     Patient ID: [name], male   DOB: [birth year], 68 y.o.   MRN: [id number]  HPI Mr. Wendling follows up for wound check left foot following previous left SFA to anterior tibial artery bypass.  Wound is healing with the help of wound VAC.  He is also had amputations of toes 2 through 5.  States that he had significant nosebleeds otherwise doing okay swelling in his left lower extremity improving.   Review of Systems No new complaints today    Objective:   Physical Exam Vitals:   08/29/18 0832  BP: 120/76  Pulse: 63  Resp: 20  Temp: (!) 97 F (36.1 C)  SpO2: 100%   Awake alert oriented Palpable left anterior tibial pulse       Assessment:     68-year male status post left lower extremity bypass SFA to anterior tibial with vein.  Now with healing wound of amputations 2 through 5.    Plan:     Continue wound VAC hopefully will heal wound soon and would expect swelling to improve Plan follow-up in 4 to 6 weeks with evaluation of left lower extremity bypass with duplex and ABIs.  Dekayla Prestridge C. Donzetta Matters, MD [facility] [place] Office: [phone] Pager: [phone]

## 2018-09-01 ENCOUNTER — Other Ambulatory Visit: Payer: Self-pay

## 2018-09-01 MED ORDER — RIVAROXABAN 20 MG PO TABS: 20.0000 mg | ORAL_TABLET | Freq: Every day | ORAL | 1 refills | Status: DC

## 2018-09-08 ENCOUNTER — Telehealth: Payer: Self-pay

## 2018-09-08 ENCOUNTER — Other Ambulatory Visit: Payer: Self-pay

## 2018-09-08 MED ORDER — CLOPIDOGREL BISULFATE 75 MG PO TABS: 75.0000 mg | ORAL_TABLET | Freq: Every day | ORAL | 2 refills | Status: DC

## 2018-09-08 NOTE — Telephone Encounter (Signed)
I am happy to see him back to manage his overall cardio vascular issues. He will always need a cardiologist to follow him closely.

## 2018-09-08 NOTE — Telephone Encounter (Signed)
Pt called to inquire if he was released from seeing as a provider regarding his circulation since Dr. Donzetta Matters preformed his surgery? Pt had toes amputated.

## 2018-09-09 ENCOUNTER — Other Ambulatory Visit: Payer: Self-pay

## 2018-09-09 DIAGNOSIS — I739 Peripheral vascular disease, unspecified: Secondary | ICD-10-CM

## 2018-09-09 MED ORDER — CLOPIDOGREL BISULFATE 75 MG PO TABS: 75.0000 mg | ORAL_TABLET | Freq: Every day | ORAL | 2 refills | Status: DC

## 2018-09-10 ENCOUNTER — Telehealth: Payer: Self-pay | Admitting: Vascular Surgery

## 2018-09-10 NOTE — Telephone Encounter (Signed)
-----   Message from Waynetta Sandy, MD sent at 09/10/2018 11:39 AM EDT ----- Regarding: RE: Wound Vac If he can tolerate wound vac would probably be beneficial.   Erlene Quan ----- Message ----- From: Kaleen Mask, LPN Sent: 2/33/6122  11:04 AM EDT To: Waynetta Sandy, MD Subject: Wound Vac                                      Hey Dr. Donzetta Matters.  Not sure if you got my page or not but this patient is having [facility] come to his home today around 1pm.  The [facility] nurse took his wound vac off 2 days ago and did a wet to dry dressing because the wound is down to an 8th of an inch. (did not know depth or width).  He is still bleeding.  He sees you again on 04/03.  She she put the vac back on or continue wet to dry until his appt? Please advise.  Thanks,  Thurston Hole., LPN

## 2018-09-10 NOTE — Telephone Encounter (Signed)
Pt verbalizes understanding and will let [facility] nurse know to re-apply.  Thurston Hole., LPN

## 2018-09-10 NOTE — Telephone Encounter (Signed)
Pt aware and will see provider when needed

## 2018-09-11 ENCOUNTER — Other Ambulatory Visit: Payer: Self-pay

## 2018-09-11 ENCOUNTER — Encounter (HOSPITAL_COMMUNITY): Payer: Self-pay | Admitting: General Practice

## 2018-09-11 ENCOUNTER — Ambulatory Visit (INDEPENDENT_AMBULATORY_CARE_PROVIDER_SITE_OTHER): Payer: Self-pay | Admitting: Family

## 2018-09-11 ENCOUNTER — Inpatient Hospital Stay (HOSPITAL_COMMUNITY)
Admission: AD | Admit: 2018-09-11 | Discharge: 2018-09-13 | DRG: 504 | Disposition: A | Discharge status: Home / Self Care | Payer: BC Managed Care – PPO | Attending: Vascular Surgery | Admitting: Vascular Surgery

## 2018-09-11 ENCOUNTER — Encounter: Payer: Self-pay | Admitting: Family

## 2018-09-11 VITALS — BP 125/85 | HR 75 | Temp 98.0°F | Resp 20 | Ht 74.0 in | Wt 236.0 lb

## 2018-09-11 DIAGNOSIS — I129 Hypertensive chronic kidney disease with stage 1 through stage 4 chronic kidney disease, or unspecified chronic kidney disease: Secondary | ICD-10-CM | POA: Diagnosis present

## 2018-09-11 DIAGNOSIS — T8189XA Other complications of procedures, not elsewhere classified, initial encounter: Secondary | ICD-10-CM | POA: Diagnosis present

## 2018-09-11 DIAGNOSIS — E1142 Type 2 diabetes mellitus with diabetic polyneuropathy: Secondary | ICD-10-CM | POA: Diagnosis present

## 2018-09-11 DIAGNOSIS — M79605 Pain in left leg: Secondary | ICD-10-CM

## 2018-09-11 DIAGNOSIS — G4733 Obstructive sleep apnea (adult) (pediatric): Secondary | ICD-10-CM | POA: Diagnosis present

## 2018-09-11 DIAGNOSIS — T8744 Infection of amputation stump, left lower extremity: Secondary | ICD-10-CM | POA: Diagnosis present

## 2018-09-11 DIAGNOSIS — Z7902 Long term (current) use of antithrombotics/antiplatelets: Secondary | ICD-10-CM

## 2018-09-11 DIAGNOSIS — Z87891 Personal history of nicotine dependence: Secondary | ICD-10-CM | POA: Diagnosis not present

## 2018-09-11 DIAGNOSIS — E1152 Type 2 diabetes mellitus with diabetic peripheral angiopathy with gangrene: Secondary | ICD-10-CM | POA: Diagnosis present

## 2018-09-11 DIAGNOSIS — N183 Chronic kidney disease, stage 3 (moderate): Secondary | ICD-10-CM | POA: Diagnosis present

## 2018-09-11 DIAGNOSIS — T8754 Necrosis of amputation stump, left lower extremity: Principal | ICD-10-CM | POA: Diagnosis present

## 2018-09-11 DIAGNOSIS — E1122 Type 2 diabetes mellitus with diabetic chronic kidney disease: Secondary | ICD-10-CM | POA: Diagnosis present

## 2018-09-11 DIAGNOSIS — L089 Local infection of the skin and subcutaneous tissue, unspecified: Secondary | ICD-10-CM | POA: Diagnosis not present

## 2018-09-11 DIAGNOSIS — I779 Disorder of arteries and arterioles, unspecified: Secondary | ICD-10-CM

## 2018-09-11 DIAGNOSIS — M1990 Unspecified osteoarthritis, unspecified site: Secondary | ICD-10-CM | POA: Diagnosis present

## 2018-09-11 DIAGNOSIS — Z7901 Long term (current) use of anticoagulants: Secondary | ICD-10-CM | POA: Diagnosis not present

## 2018-09-11 DIAGNOSIS — Z794 Long term (current) use of insulin: Secondary | ICD-10-CM

## 2018-09-11 DIAGNOSIS — I96 Gangrene, not elsewhere classified: Secondary | ICD-10-CM | POA: Diagnosis present

## 2018-09-11 DIAGNOSIS — Z8611 Personal history of tuberculosis: Secondary | ICD-10-CM | POA: Diagnosis not present

## 2018-09-11 DIAGNOSIS — Z833 Family history of diabetes mellitus: Secondary | ICD-10-CM | POA: Diagnosis not present

## 2018-09-11 DIAGNOSIS — Z79899 Other long term (current) drug therapy: Secondary | ICD-10-CM | POA: Diagnosis not present

## 2018-09-11 DIAGNOSIS — L03116 Cellulitis of left lower limb: Secondary | ICD-10-CM

## 2018-09-11 HISTORY — DX: Chronic kidney disease, stage 3 unspecified: N18.30

## 2018-09-11 HISTORY — DX: Peripheral vascular disease, unspecified: I73.9

## 2018-09-11 HISTORY — DX: Chronic kidney disease, stage 3 (moderate): N18.3

## 2018-09-11 LAB — GLUCOSE, CAPILLARY
Glucose-Capillary: 58 mg/dL — ABNORMAL LOW (ref 70–99)
Glucose-Capillary: 127 mg/dL — ABNORMAL HIGH (ref 70–99)

## 2018-09-11 LAB — CBC
HCT: 22.2 % — ABNORMAL LOW (ref 39.0–52.0)
Hemoglobin: 7 g/dL — ABNORMAL LOW (ref 13.0–17.0)
MCH: 24.5 pg — ABNORMAL LOW (ref 26.0–34.0)
MCHC: 31.5 g/dL (ref 30.0–36.0)
MCV: 77.6 fL — ABNORMAL LOW (ref 80.0–100.0)
Platelets: 200 10*3/uL (ref 150–400)
RBC: 2.86 MIL/uL — ABNORMAL LOW (ref 4.22–5.81)
RDW: 21.3 % — ABNORMAL HIGH (ref 11.5–15.5)
WBC: 6.6 10*3/uL (ref 4.0–10.5)
nRBC: 0 % (ref 0.0–0.2)

## 2018-09-11 LAB — BASIC METABOLIC PANEL
Anion gap: 10 (ref 5–15)
BUN: 16 mg/dL (ref 8–23)
CO2: 16 mmol/L — ABNORMAL LOW (ref 22–32)
Calcium: 7.1 mg/dL — ABNORMAL LOW (ref 8.9–10.3)
Chloride: 114 mmol/L — ABNORMAL HIGH (ref 98–111)
Creatinine, Ser: 1.17 mg/dL (ref 0.61–1.24)
GFR calc Af Amer: >60 mL/min (ref >60)
GFR calc non Af Amer: >60 mL/min (ref >60)
Glucose, Bld: 79 mg/dL (ref 70–99)
Potassium: 2.6 mmol/L — CL (ref 3.5–5.1)
Sodium: 140 mmol/L (ref 135–145)

## 2018-09-11 LAB — MRSA PCR SCREENING: MRSA by PCR: POSITIVE — AB

## 2018-09-11 MED ORDER — LABETALOL HCL 5 MG/ML IV SOLN: 10.0000 mg | INTRAVENOUS | Status: DC | PRN

## 2018-09-11 MED ORDER — RIVAROXABAN 20 MG PO TABS
20.0000 mg | ORAL_TABLET | Freq: Every day | ORAL | Status: DC
  Administered 2018-09-12: 20 mg via ORAL
  Filled 2018-09-11 (×2): qty 1

## 2018-09-11 MED ORDER — POTASSIUM CHLORIDE CRYS ER 20 MEQ PO TBCR: 20.0000 meq | EXTENDED_RELEASE_TABLET | Freq: Once | ORAL | Status: DC

## 2018-09-11 MED ORDER — PHENOL 1.4 % MT LIQD: 1.0000 | OROMUCOSAL | Status: DC | PRN

## 2018-09-11 MED ORDER — PREGABALIN 50 MG PO CAPS
50.0000 mg | ORAL_CAPSULE | Freq: Every day | ORAL | Status: DC
  Administered 2018-09-11: 100 mg via ORAL
  Administered 2018-09-12: 50 mg via ORAL
  Filled 2018-09-11: qty 2
  Filled 2018-09-11: qty 1

## 2018-09-11 MED ORDER — ALUM & MAG HYDROXIDE-SIMETH 200-200-20 MG/5ML PO SUSP: 15.0000 mL | ORAL | Status: DC | PRN

## 2018-09-11 MED ORDER — PRAVASTATIN SODIUM 40 MG PO TABS
40.0000 mg | ORAL_TABLET | Freq: Every day | ORAL | Status: DC
  Administered 2018-09-11 – 2018-09-12 (×2): 40 mg via ORAL
  Filled 2018-09-11 (×2): qty 1

## 2018-09-11 MED ORDER — LIRAGLUTIDE 18 MG/3ML ~~LOC~~ SOPN: 1.2000 mg | PEN_INJECTOR | Freq: Every day | SUBCUTANEOUS | Status: DC

## 2018-09-11 MED ORDER — METOPROLOL TARTRATE 5 MG/5ML IV SOLN: 2.0000 mg | INTRAVENOUS | Status: DC | PRN

## 2018-09-11 MED ORDER — VANCOMYCIN HCL 10 G IV SOLR
2000.0000 mg | Freq: Once | INTRAVENOUS | Status: AC
  Administered 2018-09-11: 2000 mg via INTRAVENOUS
  Filled 2018-09-11: qty 2000

## 2018-09-11 MED ORDER — POTASSIUM CHLORIDE CRYS ER 20 MEQ PO TBCR: 60.0000 meq | EXTENDED_RELEASE_TABLET | Freq: Two times a day (BID) | ORAL | Status: DC

## 2018-09-11 MED ORDER — POTASSIUM CHLORIDE CRYS ER 20 MEQ PO TBCR
60.0000 meq | EXTENDED_RELEASE_TABLET | Freq: Once | ORAL | Status: AC
  Administered 2018-09-11: 60 meq via ORAL
  Filled 2018-09-11: qty 3

## 2018-09-11 MED ORDER — POLYETHYLENE GLYCOL 3350 17 G PO PACK: 17.0000 g | PACK | Freq: Every day | ORAL | Status: DC | PRN

## 2018-09-11 MED ORDER — TRIAMTERENE-HCTZ 37.5-25 MG PO TABS
1.0000 | ORAL_TABLET | Freq: Every day | ORAL | Status: DC
  Administered 2018-09-11 – 2018-09-12 (×2): 1 via ORAL
  Filled 2018-09-11 (×2): qty 1

## 2018-09-11 MED ORDER — INSULIN ASPART 100 UNIT/ML ~~LOC~~ SOLN: 0.0000 [IU] | Freq: Three times a day (TID) | SUBCUTANEOUS | Status: DC

## 2018-09-11 MED ORDER — TRAMADOL HCL 50 MG PO TABS: 50.0000 mg | ORAL_TABLET | Freq: Three times a day (TID) | ORAL | 0 refills | Status: DC | PRN

## 2018-09-11 MED ORDER — CHLORHEXIDINE GLUCONATE CLOTH 2 % EX PADS
6.0000 | MEDICATED_PAD | Freq: Every day | CUTANEOUS | Status: DC
  Administered 2018-09-13: 6 via TOPICAL

## 2018-09-11 MED ORDER — LOSARTAN POTASSIUM 50 MG PO TABS
100.0000 mg | ORAL_TABLET | Freq: Every day | ORAL | Status: DC
  Administered 2018-09-11 – 2018-09-12 (×2): 100 mg via ORAL
  Filled 2018-09-11 (×2): qty 2

## 2018-09-11 MED ORDER — PIPERACILLIN-TAZOBACTAM 3.375 G IVPB
3.3750 g | Freq: Three times a day (TID) | INTRAVENOUS | Status: DC
  Administered 2018-09-11 – 2018-09-13 (×6): 3.375 g via INTRAVENOUS
  Filled 2018-09-11 (×8): qty 50

## 2018-09-11 MED ORDER — ALLOPURINOL 100 MG PO TABS
100.0000 mg | ORAL_TABLET | Freq: Every day | ORAL | Status: DC
  Administered 2018-09-12 – 2018-09-13 (×2): 100 mg via ORAL
  Filled 2018-09-11 (×2): qty 1

## 2018-09-11 MED ORDER — OXYCODONE-ACETAMINOPHEN 5-325 MG PO TABS
1.0000 | ORAL_TABLET | ORAL | Status: DC | PRN
  Administered 2018-09-11 – 2018-09-12 (×4): 1 via ORAL
  Filled 2018-09-11 (×5): qty 1

## 2018-09-11 MED ORDER — PANTOPRAZOLE SODIUM 40 MG PO TBEC
40.0000 mg | DELAYED_RELEASE_TABLET | Freq: Every day | ORAL | Status: DC
  Administered 2018-09-12 – 2018-09-13 (×2): 40 mg via ORAL
  Filled 2018-09-11 (×2): qty 1

## 2018-09-11 MED ORDER — VANCOMYCIN HCL 10 G IV SOLR
1250.0000 mg | Freq: Two times a day (BID) | INTRAVENOUS | Status: DC
  Administered 2018-09-12: 1250 mg via INTRAVENOUS
  Filled 2018-09-11 (×2): qty 1250

## 2018-09-11 MED ORDER — INSULIN PUMP
Freq: Three times a day (TID) | SUBCUTANEOUS | Status: DC
  Administered 2018-09-11: 22:00:00 via SUBCUTANEOUS
  Administered 2018-09-12: 2 via SUBCUTANEOUS
  Administered 2018-09-12: 8 via SUBCUTANEOUS
  Administered 2018-09-12: 6 via SUBCUTANEOUS
  Administered 2018-09-13: 2 via SUBCUTANEOUS

## 2018-09-11 MED ORDER — HYDRALAZINE HCL 20 MG/ML IJ SOLN: 5.0000 mg | INTRAMUSCULAR | Status: DC | PRN

## 2018-09-11 MED ORDER — COLCHICINE 0.6 MG PO TABS
0.6000 mg | ORAL_TABLET | Freq: Two times a day (BID) | ORAL | Status: DC
  Administered 2018-09-11 – 2018-09-13 (×4): 0.6 mg via ORAL
  Filled 2018-09-11 (×4): qty 1

## 2018-09-11 MED ORDER — CARVEDILOL 12.5 MG PO TABS
12.5000 mg | ORAL_TABLET | Freq: Every day | ORAL | Status: DC
  Administered 2018-09-11 – 2018-09-12 (×2): 12.5 mg via ORAL
  Filled 2018-09-11 (×2): qty 1

## 2018-09-11 MED ORDER — CLOPIDOGREL BISULFATE 75 MG PO TABS
75.0000 mg | ORAL_TABLET | Freq: Every day | ORAL | Status: DC
  Administered 2018-09-12 – 2018-09-13 (×2): 75 mg via ORAL
  Filled 2018-09-11 (×2): qty 1

## 2018-09-11 MED ORDER — MUPIROCIN 2 % EX OINT
1.0000 "application " | TOPICAL_OINTMENT | Freq: Two times a day (BID) | CUTANEOUS | Status: DC
  Administered 2018-09-11 – 2018-09-13 (×4): 1 via NASAL
  Filled 2018-09-11 (×2): qty 22

## 2018-09-11 MED ORDER — ONDANSETRON HCL 4 MG/2ML IJ SOLN: 4.0000 mg | Freq: Four times a day (QID) | INTRAMUSCULAR | Status: DC | PRN

## 2018-09-11 MED ORDER — GUAIFENESIN-DM 100-10 MG/5ML PO SYRP: 15.0000 mL | ORAL_SOLUTION | ORAL | Status: DC | PRN
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
## 2018-09-11 NOTE — Progress Notes (Signed)
Patient arrived from the office to 4E room 6 for Dr. Claretha Cooper service with infection of left 2-5 toe amputation site.  Toes amputated in January 2020.  Telemetry monitor applied and CCMD notified.  Patient oriented to unit and room to include call light and phone.  Will continue to monitor.

## 2018-09-11 NOTE — Progress Notes (Signed)
Pharmacy Antibiotic Note  [name] is a 68 y.o. male admitted on 09/11/2018 with amputation site infection .  Pharmacy has been consulted for Vancomycin / Zosyn dosing.  Plan: Vancomycin 2 grams iv x 1 then 1250 mg iv Q 12 hours Expected AUC 482 Scr used 1.17  Zosyn 3.375 grams iv Q 8 hours Follow up progress, cultures, Scr  Height: 6\' 2"  (188 cm) Weight: 224 lb 6.9 oz (101.8 kg) IBW/kg (Calculated) : 82.2  Temp (24hrs), Avg:98.1 F (36.7 C), Min:98 F (36.7 C), Max:98.2 F (36.8 C)  Recent Labs  Lab 09/11/18 1701  WBC 6.6  CREATININE 1.17    Estimated Creatinine Clearance: 78 mL/min (by C-G formula based on SCr of 1.17 mg/dL).    No Known Allergies   Thank you for allowing pharmacy to be a part of this patient's care. Anette Guarneri, PharmD [phone] 09/11/2018 7:23 PM

## 2018-09-11 NOTE — Patient Instructions (Signed)
Peripheral Vascular Disease  Peripheral vascular disease (PVD) is a disease of the blood vessels that are not part of your heart and brain. A simple term for PVD is poor circulation. In most cases, PVD narrows the blood vessels that carry blood from your heart to the rest of your body. This can reduce the supply of blood to your arms, legs, and internal organs, like your stomach or kidneys. However, PVD most often affects a person's lower legs and feet. Without treatment, PVD tends to get worse. PVD can also lead to acute ischemic limb. This is when an arm or leg suddenly cannot get enough blood. This is a medical emergency. Follow these instructions at home: Lifestyle  Do not use any products that contain nicotine or tobacco, such as cigarettes and e-cigarettes. If you need help quitting, ask your doctor.  Lose weight if you are overweight. Or, stay at a healthy weight as told by your doctor.  Eat a diet that is low in fat and cholesterol. If you need help, ask your doctor.  Exercise regularly. Ask your doctor for activities that are right for you. General instructions  Take over-the-counter and prescription medicines only as told by your doctor.  Take good care of your feet: ? Wear comfortable shoes that fit well. ? Check your feet often for any cuts or sores.  Keep all follow-up visits as told by your doctor This is important. Contact a doctor if:  You have cramps in your legs when you walk.  You have leg pain when you are at rest.  You have coldness in a leg or foot.  Your skin changes.  You are unable to get or have an erection (erectile dysfunction).  You have cuts or sores on your feet that do not heal. Get help right away if:  Your arm or leg turns cold, numb, and blue.  Your arms or legs become red, warm, swollen, painful, or numb.  You have chest pain.  You have trouble breathing.  You suddenly have weakness in your face, arm, or leg.  You become very  confused or you cannot speak.  You suddenly have a very bad headache.  You suddenly cannot see. Summary  Peripheral vascular disease (PVD) is a disease of the blood vessels.  A simple term for PVD is poor circulation. Without treatment, PVD tends to get worse.  Treatment may include exercise, low fat and low cholesterol diet, and quitting smoking. This information is not intended to replace advice given to you by your health care provider. Make sure you discuss any questions you have with your health care provider. Document Released: 09/05/2009 Document Revised: 07/19/2016 Document Reviewed: 07/19/2016 Elsevier Interactive Patient Education  2019 [employer]  
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
## 2018-09-11 NOTE — Progress Notes (Signed)
CRITICAL VALUE ALERT  Critical Value:  Potassium 2.6  Date & Time Notied:  09/11/18 5:50pm  Provider Notified: Vasc on call  Orders Received/Actions taken: new orders as necessary

## 2018-09-12 ENCOUNTER — Encounter (HOSPITAL_COMMUNITY)
Admission: AD | Disposition: A | Discharge status: Home / Self Care | Payer: Self-pay | Source: Home / Self Care | Attending: Vascular Surgery

## 2018-09-12 ENCOUNTER — Encounter (HOSPITAL_COMMUNITY): Payer: Self-pay

## 2018-09-12 ENCOUNTER — Inpatient Hospital Stay (HOSPITAL_COMMUNITY): Payer: BC Managed Care – PPO | Admitting: Certified Registered Nurse Anesthetist

## 2018-09-12 DIAGNOSIS — L089 Local infection of the skin and subcutaneous tissue, unspecified: Secondary | ICD-10-CM

## 2018-09-12 HISTORY — PX: WOUND DEBRIDEMENT: SHX247

## 2018-09-12 LAB — GLUCOSE, CAPILLARY
Glucose-Capillary: 131 mg/dL — ABNORMAL HIGH (ref 70–99)
Glucose-Capillary: 90 mg/dL (ref 70–99)
Glucose-Capillary: 55 mg/dL — ABNORMAL LOW (ref 70–99)
Glucose-Capillary: 88 mg/dL (ref 70–99)
Glucose-Capillary: 71 mg/dL (ref 70–99)
Glucose-Capillary: 61 mg/dL — ABNORMAL LOW (ref 70–99)
Glucose-Capillary: 99 mg/dL (ref 70–99)
Glucose-Capillary: 146 mg/dL — ABNORMAL HIGH (ref 70–99)
Glucose-Capillary: 221 mg/dL — ABNORMAL HIGH (ref 70–99)
Glucose-Capillary: 285 mg/dL — ABNORMAL HIGH (ref 70–99)
Glucose-Capillary: 247 mg/dL — ABNORMAL HIGH (ref 70–99)

## 2018-09-12 LAB — BASIC METABOLIC PANEL
Anion gap: 8 (ref 5–15)
BUN: 17 mg/dL (ref 8–23)
CO2: 22 mmol/L (ref 22–32)
Calcium: 8.8 mg/dL — ABNORMAL LOW (ref 8.9–10.3)
Chloride: 106 mmol/L (ref 98–111)
Creatinine, Ser: 1.71 mg/dL — ABNORMAL HIGH (ref 0.61–1.24)
GFR calc Af Amer: 47 mL/min — ABNORMAL LOW (ref >60)
GFR calc non Af Amer: 41 mL/min — ABNORMAL LOW (ref >60)
Glucose, Bld: 112 mg/dL — ABNORMAL HIGH (ref 70–99)
Potassium: 4 mmol/L (ref 3.5–5.1)
Sodium: 136 mmol/L (ref 135–145)

## 2018-09-12 SURGERY — DEBRIDEMENT, WOUND
Anesthesia: General | Site: Foot | Laterality: Left

## 2018-09-12 MED ORDER — FENTANYL CITRATE (PF) 100 MCG/2ML IJ SOLN
INTRAMUSCULAR | Status: DC | PRN
  Administered 2018-09-12: 25 ug via INTRAVENOUS
  Administered 2018-09-12: 50 ug via INTRAVENOUS

## 2018-09-12 MED ORDER — LACTATED RINGERS IV SOLN
INTRAVENOUS | Status: DC
  Administered 2018-09-12: 10:00:00 via INTRAVENOUS

## 2018-09-12 MED ORDER — LIDOCAINE 2% (20 MG/ML) 5 ML SYRINGE
INTRAMUSCULAR | Status: DC | PRN
  Administered 2018-09-12: 100 mg via INTRAVENOUS

## 2018-09-12 MED ORDER — MEPERIDINE HCL 50 MG/ML IJ SOLN: 6.2500 mg | INTRAMUSCULAR | Status: DC | PRN

## 2018-09-12 MED ORDER — HYDROMORPHONE HCL 1 MG/ML IJ SOLN: 0.2500 mg | INTRAMUSCULAR | Status: DC | PRN

## 2018-09-12 MED ORDER — EPHEDRINE SULFATE-NACL 50-0.9 MG/10ML-% IV SOSY
PREFILLED_SYRINGE | INTRAVENOUS | Status: DC | PRN
  Administered 2018-09-12 (×4): 10 mg via INTRAVENOUS

## 2018-09-12 MED ORDER — VANCOMYCIN HCL 10 G IV SOLR
1250.0000 mg | INTRAVENOUS | Status: DC
  Administered 2018-09-13: 1250 mg via INTRAVENOUS
  Filled 2018-09-12: qty 1250

## 2018-09-12 MED ORDER — LIDOCAINE 2% (20 MG/ML) 5 ML SYRINGE
INTRAMUSCULAR | Status: AC
  Filled 2018-09-12: qty 5

## 2018-09-12 MED ORDER — OXYCODONE HCL 5 MG PO TABS: 5.0000 mg | ORAL_TABLET | Freq: Once | ORAL | Status: DC | PRN

## 2018-09-12 MED ORDER — DEXTROSE 50 % IV SOLN
12.5000 g | INTRAVENOUS | Status: AC
  Administered 2018-09-12: 12.5 g via INTRAVENOUS

## 2018-09-12 MED ORDER — FENTANYL CITRATE (PF) 250 MCG/5ML IJ SOLN
INTRAMUSCULAR | Status: AC
  Filled 2018-09-12: qty 5

## 2018-09-12 MED ORDER — DEXTROSE 50 % IV SOLN
25.0000 mL | Freq: Once | INTRAVENOUS | Status: AC
  Administered 2018-09-12: 25 mL via INTRAVENOUS

## 2018-09-12 MED ORDER — SODIUM CHLORIDE 0.9 % IV SOLN
INTRAVENOUS | Status: DC | PRN
  Administered 2018-09-12: 50 ug/min via INTRAVENOUS

## 2018-09-12 MED ORDER — DEXAMETHASONE SODIUM PHOSPHATE 10 MG/ML IJ SOLN
INTRAMUSCULAR | Status: AC
  Filled 2018-09-12: qty 1

## 2018-09-12 MED ORDER — OXYCODONE HCL 5 MG/5ML PO SOLN: 5.0000 mg | Freq: Once | ORAL | Status: DC | PRN

## 2018-09-12 MED ORDER — INSULIN ASPART 100 UNIT/ML ~~LOC~~ SOLN
0.0000 [IU] | Freq: Three times a day (TID) | SUBCUTANEOUS | Status: DC
  Administered 2018-09-12: 3 [IU] via SUBCUTANEOUS
  Administered 2018-09-13 (×2): 2 [IU] via SUBCUTANEOUS

## 2018-09-12 MED ORDER — DEXAMETHASONE SODIUM PHOSPHATE 10 MG/ML IJ SOLN
INTRAMUSCULAR | Status: DC | PRN
  Administered 2018-09-12: 10 mg via INTRAVENOUS

## 2018-09-12 MED ORDER — EPHEDRINE 5 MG/ML INJ
INTRAVENOUS | Status: AC
  Filled 2018-09-12: qty 10

## 2018-09-12 MED ORDER — PROPOFOL 10 MG/ML IV BOLUS
INTRAVENOUS | Status: DC | PRN
  Administered 2018-09-12: 200 mg via INTRAVENOUS

## 2018-09-12 MED ORDER — PHENYLEPHRINE 40 MCG/ML (10ML) SYRINGE FOR IV PUSH (FOR BLOOD PRESSURE SUPPORT)
PREFILLED_SYRINGE | INTRAVENOUS | Status: AC
  Filled 2018-09-12: qty 10

## 2018-09-12 MED ORDER — ONDANSETRON HCL 4 MG/2ML IJ SOLN
INTRAMUSCULAR | Status: AC
  Filled 2018-09-12: qty 2

## 2018-09-12 MED ORDER — PHENYLEPHRINE 40 MCG/ML (10ML) SYRINGE FOR IV PUSH (FOR BLOOD PRESSURE SUPPORT)
PREFILLED_SYRINGE | INTRAVENOUS | Status: DC | PRN
  Administered 2018-09-12 (×3): 80 ug via INTRAVENOUS

## 2018-09-12 MED ORDER — PROMETHAZINE HCL 25 MG/ML IJ SOLN: 6.2500 mg | INTRAMUSCULAR | Status: DC | PRN

## 2018-09-12 MED ORDER — DEXTROSE 50 % IV SOLN
INTRAVENOUS | Status: AC
  Administered 2018-09-12: 12.5 g via INTRAVENOUS
  Filled 2018-09-12: qty 50

## 2018-09-12 MED ORDER — 0.9 % SODIUM CHLORIDE (POUR BTL) OPTIME
TOPICAL | Status: DC | PRN
  Administered 2018-09-12: 1000 mL

## 2018-09-12 MED ORDER — ONDANSETRON HCL 4 MG/2ML IJ SOLN
INTRAMUSCULAR | Status: DC | PRN
  Administered 2018-09-12: 4 mg via INTRAVENOUS

## 2018-09-12 SURGICAL SUPPLY — 40 items
BANDAGE ACE 4X5 VEL STRL LF (GAUZE/BANDAGES/DRESSINGS) ×6 IMPLANT
BANDAGE ACE 6X5 VEL STRL LF (GAUZE/BANDAGES/DRESSINGS) IMPLANT
BNDG GAUZE ELAST 4 BULKY (GAUZE/BANDAGES/DRESSINGS) ×6 IMPLANT
CANISTER SUCT 3000ML PPV (MISCELLANEOUS) ×3 IMPLANT
COVER SURGICAL LIGHT HANDLE (MISCELLANEOUS) ×3 IMPLANT
COVER WAND RF STERILE (DRAPES) IMPLANT
DRAPE HALF SHEET 40X57 (DRAPES) IMPLANT
DRAPE U-SHAPE 76X120 STRL (DRAPES) IMPLANT
ELECT CAUTERY BLADE 6.4 (BLADE) ×3 IMPLANT
ELECT REM PT RETURN 9FT ADLT (ELECTROSURGICAL) ×3
ELECTRODE REM PT RTRN 9FT ADLT (ELECTROSURGICAL) ×2 IMPLANT
GAUZE SPONGE 4X4 12PLY STRL (GAUZE/BANDAGES/DRESSINGS) ×3 IMPLANT
GAUZE XEROFORM 5X9 LF (GAUZE/BANDAGES/DRESSINGS) IMPLANT
GLOVE BIOGEL PI IND STRL 7.5 (GLOVE) ×2 IMPLANT
GLOVE BIOGEL PI INDICATOR 7.5 (GLOVE) ×1
GLOVE SS BIOGEL STRL SZ 7.5 (GLOVE) ×2 IMPLANT
GLOVE SUPERSENSE BIOGEL SZ 7.5 (GLOVE) ×1
GLOVE SURG SS PI 7.5 STRL IVOR (GLOVE) ×3 IMPLANT
GOWN STRL REUS W/ TWL LRG LVL3 (GOWN DISPOSABLE) ×4 IMPLANT
GOWN STRL REUS W/ TWL XL LVL3 (GOWN DISPOSABLE) ×2 IMPLANT
GOWN STRL REUS W/TWL LRG LVL3 (GOWN DISPOSABLE) ×2
GOWN STRL REUS W/TWL XL LVL3 (GOWN DISPOSABLE) ×1
KIT BASIN OR (CUSTOM PROCEDURE TRAY) ×3 IMPLANT
KIT TURNOVER KIT B (KITS) ×3 IMPLANT
NS IRRIG 1000ML POUR BTL (IV SOLUTION) ×3 IMPLANT
PACK CV ACCESS (CUSTOM PROCEDURE TRAY) IMPLANT
PACK UNIVERSAL I (CUSTOM PROCEDURE TRAY) ×3 IMPLANT
PAD ARMBOARD 7.5X6 YLW CONV (MISCELLANEOUS) ×6 IMPLANT
PENCIL BUTTON HOLSTER BLD 10FT (ELECTRODE) ×3 IMPLANT
SUT ETHILON 3 0 PS 1 (SUTURE) IMPLANT
SUT VIC AB 2-0 CTX 36 (SUTURE) IMPLANT
SUT VIC AB 3-0 SH 27 (SUTURE)
SUT VIC AB 3-0 SH 27X BRD (SUTURE) IMPLANT
SUT VICRYL 4-0 PS2 18IN ABS (SUTURE) IMPLANT
SWAB COLLECTION DEVICE MRSA (MISCELLANEOUS) ×3 IMPLANT
SWAB CULTURE ESWAB REG 1ML (MISCELLANEOUS) ×3 IMPLANT
SYR BULB IRRIGATION 50ML (SYRINGE) ×3 IMPLANT
TOWEL GREEN STERILE (TOWEL DISPOSABLE) ×3 IMPLANT
WATER STERILE IRR 1000ML POUR (IV SOLUTION) IMPLANT
YANKAUER SUCT BULB TIP NO VENT (SUCTIONS) ×3 IMPLANT

## 2018-09-12 NOTE — Progress Notes (Signed)
  Progress Note    09/12/2018 9:10 AM * No surgery date entered *  Subjective: Minimal left foot pain  Vitals:   09/11/18 2105 09/12/18 0406  BP: (!) 122/59 107/67  Pulse:    Resp: 19 16  Temp: 98.6 F (37 C) 98.6 F (37 C)  SpO2: 100% 97%    Physical Exam: Awake alert oriented Palpable left AT pulse Left foot dressing clean dry intact  CBC    Component Value Date/Time   WBC 6.6 09/11/2018 1701   RBC 2.86 (L) 09/11/2018 1701   HGB 7.0 (L) 09/11/2018 1701   HCT 22.2 (L) 09/11/2018 1701   PLT 200 09/11/2018 1701   MCV 77.6 (L) 09/11/2018 1701   MCH 24.5 (L) 09/11/2018 1701   MCHC 31.5 09/11/2018 1701   RDW 21.3 (H) 09/11/2018 1701   LYMPHSABS 0.6 (L) 06/27/2018 1045   MONOABS 0.9 06/27/2018 1045   EOSABS 0.1 06/27/2018 1045   BASOSABS 0.0 06/27/2018 1045    BMET    Component Value Date/Time   NA 136 09/12/2018 0332   K 4.0 09/12/2018 0332   CL 106 09/12/2018 0332   CO2 22 09/12/2018 0332   GLUCOSE 112 (H) 09/12/2018 0332   BUN 17 09/12/2018 0332   CREATININE 1.71 (H) 09/12/2018 0332   CALCIUM 8.8 (L) 09/12/2018 0332   GFRNONAA 41 (L) 09/12/2018 0332   GFRAA 47 (L) 09/12/2018 0332    INR    Component Value Date/Time   INR 0.93 12/01/2012 1908     Intake/Output Summary (Last 24 hours) at 09/12/2018 0910 Last data filed at 09/12/2018 0816 Gross per 24 hour  Intake 605.21 ml  Output 1050 ml  Net -444.79 ml     Assessment:  68 y.o. male is s/p left femoral to anterior tibial artery bypass with vein subsequent amputation of toes 2 through 5 on the left that is been healing with wound VAC appears to have exposed bone on the lateral aspect.  Plan: OR today with Dr. Trula Slade for debridement of left foot wound possible wound VAC placement.  Anida Deol C. Donzetta Matters, MD [facility] Office: [phone] Pager: [phone]  09/12/2018 9:10 AM

## 2018-09-12 NOTE — Op Note (Signed)
    Patient name: [name] MRN: [id number] DOB: 03-26-1951 Sex: male  09/12/2018 Pre-operative Diagnosis: Left foot wound Post-operative diagnosis:  Same Surgeon:  Annamarie Major Assistants: None Procedure:   I&D of left foot including subcutaneous tissue and bone Anesthesia: General Blood Loss: Minimal Specimens: Cultures from the wound were sent for microbiology  Findings: Excellent bleeding at the prior amputation site.  Rongeurs were used to resect some of the exposed bone.  This was brittle and healthy  Indications: The patient has previously undergone revascularization and partial foot amputation.  This is been granulating and however on the lateral aspect of the wound, there was necrotic tissue and so operative exploration was recommended  Procedure:  The patient was identified in the holding area and taken to [place] 16  The patient was then placed supine on the table. general anesthesia was administered.  The patient was prepped and draped in the usual sterile fashion.  A time out was called and antibiotics were administered.  I used a 10 blade to slightly open the wound on the lateral aspect of the previous metatarsal amputation.  The 10 blade was also used to remove necrotic tissue.  There did appear to be some tissue coverage over the bone at this level however I used rongeurs to resect additional portions of the bone.  The bone appeared to be very healthy with no signs of osteomyelitis.  Cultures of the area was sent.  After all necrotic tissue was removed, the wound was irrigated.  Because there was healthy bleeding, I elected to pack the wound with 4 x 4's and a sterile dressings were applied.  There were no immediate complications.   Disposition: To PACU stable.   Theotis Burrow, M.D., [facility] [place] Office: [phone] Pager:  [phone]

## 2018-09-12 NOTE — Progress Notes (Signed)
Pharmacy Antibiotic Note  [name] is a 68 y.o. male admitted on 09/11/2018 with amputation site infection. Pharmacy has been consulted for Vancomycin / Zosyn dosing. Cr has risen overnight, planning for I&D today in OR. Will adjust vancomycin.  Plan: -Reduce vancomycin to 1250mg  IV q24h -Continue Zosyn 3.375g IV EI q8h  Height: 6\' 2"  (188 cm) Weight: 224 lb 6.9 oz (101.8 kg) IBW/kg (Calculated) : 82.2  Temp (24hrs), Avg:98.3 F (36.8 C), Min:98 F (36.7 C), Max:98.6 F (37 C)  Recent Labs  Lab 09/11/18 1701 09/12/18 0332  WBC 6.6  --   CREATININE 1.17 1.71*    Estimated Creatinine Clearance: 53.4 mL/min (A) (by C-G formula based on SCr of 1.71 mg/dL (H)).    No Known Allergies   Arrie Senate, PharmD, BCPS Clinical Pharmacist [phone] Please check AMION for all [facility] numbers 09/12/2018

## 2018-09-12 NOTE — Progress Notes (Signed)
Hypoglycemic Event  CBG: 61  Treatment: D50 25 mL (12.5 gm)  Symptoms: None  Follow-up CBG: Time:1313 CBG Result:99  Possible Reasons for Event: Unknown  Comments/MD notified:Dr. Horton Chin

## 2018-09-12 NOTE — Anesthesia Preprocedure Evaluation (Signed)
Anesthesia Evaluation  Patient identified by MRN, date of birth, ID band Patient awake    Reviewed: Allergy & Precautions, NPO status , Patient's Chart, lab work & pertinent test results  History of Anesthesia Complications Negative for: history of anesthetic complications  Airway Mallampati: III  TM Distance: >3 FB Neck ROM: Full    Dental no notable dental hx. (+) Teeth Intact   Pulmonary sleep apnea , former smoker,    Pulmonary exam normal        Cardiovascular hypertension, + Peripheral Vascular Disease and + DVT  Normal cardiovascular exam Rhythm:Regular     Neuro/Psych  Neuromuscular disease negative psych ROS   GI/Hepatic negative GI ROS, Neg liver ROS,   Endo/Other  diabetes, Insulin Dependent  Renal/GU Renal InsufficiencyRenal disease     Musculoskeletal  (+) Arthritis ,   Abdominal   Peds  Hematology  (+) anemia ,   Anesthesia Other Findings 68 yo M for femoral-peroneal bypass graft and L TMA - OSA on CPAP, HTN, DVT, PVD, CKD 3, IDDM - TTE 2014: EF 60-65%, normal valves  Reproductive/Obstetrics                             Anesthesia Physical  Anesthesia Plan  ASA: III  Anesthesia Plan: General   Post-op Pain Management:    Induction: Intravenous  PONV Risk Score and Plan: 2 and Ondansetron, Dexamethasone and Treatment may vary due to age or medical condition  Airway Management Planned: LMA  Additional Equipment: None  Intra-op Plan:   Post-operative Plan: Extubation in OR  Informed Consent: I have reviewed the patients History and Physical, chart, labs and discussed the procedure including the risks, benefits and alternatives for the proposed anesthesia with the patient or authorized representative who has indicated his/her understanding and acceptance.     Dental advisory given  Plan Discussed with: CRNA  Anesthesia Plan Comments:          Anesthesia Quick Evaluation

## 2018-09-12 NOTE — Anesthesia Procedure Notes (Signed)
Procedure Name: LMA Insertion Date/Time: 09/12/2018 11:33 AM Performed by: Genelle Bal, CRNA Pre-anesthesia Checklist: Patient identified, Emergency Drugs available, Suction available and Patient being monitored Patient Re-evaluated:Patient Re-evaluated prior to induction Oxygen Delivery Method: Circle system utilized Preoxygenation: Pre-oxygenation with 100% oxygen Induction Type: IV induction Ventilation: Mask ventilation without difficulty LMA: LMA inserted LMA Size: 5.0 Number of attempts: 1 Airway Equipment and Method: Bite block Placement Confirmation: positive ETCO2 Tube secured with: Tape Dental Injury: Teeth and Oropharynx as per pre-operative assessment

## 2018-09-12 NOTE — Transfer of Care (Signed)
Immediate Anesthesia Transfer of Care Note  Patient: [name]  Procedure(s) Performed: DEBRIDEMENT WOUND LEFT FOOT (Left )  Patient Location: PACU  Anesthesia Type:General  Level of Consciousness: awake, alert  and oriented  Airway & Oxygen Therapy: Patient Spontanous Breathing and Patient connected to face mask oxygen  Post-op Assessment: Report given to RN and Post -op Vital signs reviewed and stable  Post vital signs: Reviewed and stable  Last Vitals:  Vitals Value Taken Time  BP 113/65 09/12/2018 12:25 PM  Temp    Pulse 70 09/12/2018 12:26 PM  Resp 28 09/12/2018 12:26 PM  SpO2 100 % 09/12/2018 12:26 PM  Vitals shown include unvalidated device data.  Last Pain:  Vitals:   09/12/18 1026  TempSrc:   PainSc: 0-No pain         Complications: No apparent anesthesia complications

## 2018-09-12 NOTE — Progress Notes (Signed)
Inpatient Diabetes Program Recommendations  AACE/ADA: New Consensus Statement on Inpatient Glycemic Control (2015)  Target Ranges:  Prepandial:   less than 140 mg/dL      Peak postprandial:   less than 180 mg/dL (1-2 hours)      Critically ill patients:  140 - 180 mg/dL   Lab Results  Component Value Date   GLUCAP 146 (H) 09/12/2018   HGBA1C 6.5 (H) 06/28/2018    Results for [name], [name] (MRN [id number]) as of 09/12/2018 15:16  Ref. Range 09/11/2018 16:20 09/11/2018 21:55 09/12/2018 02:29 09/12/2018 06:09 09/12/2018 10:26 09/12/2018 10:46 09/12/2018 12:24 09/12/2018 12:57 09/12/2018 13:13 09/12/2018 14:39  Glucose-Capillary Latest Ref Range: 70 - 99 mg/dL 58 (L) 127 (H) 131 (H) 90 55 (L)  Hypo treated with 25 mg D50 at 1033 88 71 61 (L)  Hypo treated with 25 mg D50 at 1256 99 146 (H)         Review of Glycemic Control  Diabetes history: DM2  Outpatient Diabetes medications: Victoza 1.2 mg sq q hs                                                        Metormin 1000 mg daily                                                        V - GO 20 (daily disposable insulin pump)               Current DM inpatient orders:        Novolog correction sensitive scale (0-9 units) tid                                                       Insulin pump order (patient wearing his V- GO 20 which has 20 units of basal insulin (this is given over a 24 hour period) and in addition patient gives himself "clicks" for meal coverage. Each "click" equals 2 units of insulin.                                                                                     Patient went to OR this am for debridement of left foot wound. Met with patient this afternoon to try and determine why he has been low today. He was wearing his V-GO pump when he was admitted until just prior to surgery. As he was NPO pre -op and had his pump on this is likely why he went low. Patient was back from OR and about to eat his meal at 1430 when I  was in his room. He had just applied a new V-GO 20  pump (a new one is applied daily). He was about to eat his [place] sandwich and stated he would give himself 2 "clicks" for that meal. This is considered the meal coverage that he will dose when he eats. Spoke to his nurse Delsa Sale and she will document in the [facility] when patient gives himself "clicks" (each is equivalent to 2 units of insulin).   With the V-Go pump it is appropriate for additional Novolog correction scale to be ordered as the V-Go covers the basal/meal coverage. As patient had the moderate scale ordered and he had been having lows I called Dr. Donzetta Matters and obtained an order for the Novolog sensitive correction scale tid.   Patient had Decadron 10 mg given in OR and blood glucose will likely be elevated today partially due to those steroids.   Discussed home diabetes management with patient. He sees Dr. [name] (internal medicine) as an outpatient and this is who orders/manages his V-Go pump. Patient has been on it for several years and had been on the V - GO 30 until last fall after losing weight it was decreased to the V-GO 20. He checks his CBG 3x a day and takes his meter to his MD appts to be downloaded. I informed patient I was concerned his CBG was 58 on admission. He said he had breakfast yesterday am then was at the MD office, home to pack, and to the hospital to be admitted and had not eaten since breakfast. He states he can tell when his blood glucose is low and rarely drops into the 50's at home. Discussed with patient that his last Hgb A1c was 6.5% in January and while that is within goal that also may mean he is having lows at home. Encouraged patient to keep f/u appointments with his MD in regards to his diabetes to make sure he is on the appropriate dosage for his V-Go.  Spoke with his RN and showed her where insulin pump flowsheet was in chart to document on q shift. Patient already signed the insulin pump contract. She is aware to  document in [facility] the amount patient gives himself with meals as well as any other additional correction needed.    -- Will follow during hospitalization.--  Jonna Clark RN, MSN Diabetes Coordinator Inpatient Glycemic Control Team Team Pager: [phone] (8am-5pm)

## 2018-09-13 LAB — BASIC METABOLIC PANEL
Anion gap: 9 (ref 5–15)
BUN: 18 mg/dL (ref 8–23)
CO2: 24 mmol/L (ref 22–32)
Calcium: 9.3 mg/dL (ref 8.9–10.3)
Chloride: 101 mmol/L (ref 98–111)
Creatinine, Ser: 1.5 mg/dL — ABNORMAL HIGH (ref 0.61–1.24)
GFR calc Af Amer: 55 mL/min — ABNORMAL LOW (ref >60)
GFR calc non Af Amer: 47 mL/min — ABNORMAL LOW (ref >60)
Glucose, Bld: 152 mg/dL — ABNORMAL HIGH (ref 70–99)
Potassium: 4.2 mmol/L (ref 3.5–5.1)
Sodium: 134 mmol/L — ABNORMAL LOW (ref 135–145)

## 2018-09-13 LAB — GLUCOSE, CAPILLARY
Glucose-Capillary: 151 mg/dL — ABNORMAL HIGH (ref 70–99)
Glucose-Capillary: 132 mg/dL — ABNORMAL HIGH (ref 70–99)
Glucose-Capillary: 119 mg/dL — ABNORMAL HIGH (ref 70–99)

## 2018-09-13 MED ORDER — OXYCODONE-ACETAMINOPHEN 5-325 MG PO TABS: 1.0000 | ORAL_TABLET | ORAL | 0 refills | Status: DC | PRN

## 2018-09-13 MED ORDER — SULFAMETHOXAZOLE-TRIMETHOPRIM 400-80 MG PO TABS: 1.0000 | ORAL_TABLET | Freq: Two times a day (BID) | ORAL | 2 refills | Status: DC

## 2018-09-13 NOTE — Progress Notes (Addendum)
[facility] [place]  Subjective  - Doing well over all, no new complaints.   Objective 122/80 65 98 F (36.7 C) (Oral) 15 100%  Intake/Output Summary (Last 24 hours) at 09/13/2018 5974 Last data filed at 09/13/2018 0234 Gross per 24 hour  Intake 873.9 ml  Output 1135 ml  Net -261.1 ml    Left foot dressing clean and dry Heart RRR Lungs non labored breathing  Assessment/Planning: POD #1  I&D of left foot including subcutaneous tissue and bone  Cultures pending no growth to date Will discharge on Bactrim and oxycodone  RN HH to re apply wound vac at home.  [name] 09/13/2018 8:28 AM --  Laboratory Lab Results: Recent Labs    09/11/18 1701  WBC 6.6  HGB 7.0*  HCT 22.2*  PLT 200   BMET Recent Labs    09/12/18 0332 09/13/18 0139  NA 136 134*  K 4.0 4.2  CL 106 101  CO2 22 24  GLUCOSE 112* 152*  BUN 17 18  CREATININE 1.71* 1.50*  CALCIUM 8.8* 9.3    COAG Lab Results  Component Value Date   INR 0.93 12/01/2012   No results found for: PTT   Agree with the above.  Home today with PO abx.  F/u 2 weeks   Annamarie Major

## 2018-09-13 NOTE — Progress Notes (Signed)
Discharged to home with family office visits in place teaching done  

## 2018-09-13 NOTE — Discharge Instructions (Signed)
Discharge home with dressing in place.  Have the patient call [facility] RN to replace wound vac once at home.

## 2018-09-15 ENCOUNTER — Encounter (HOSPITAL_COMMUNITY): Payer: Self-pay | Admitting: Surgery

## 2018-09-15 NOTE — Anesthesia Postprocedure Evaluation (Signed)
Anesthesia Post Note  Patient: [name]  Procedure(s) Performed: DEBRIDEMENT WOUND LEFT FOOT (Left Foot)     Anesthesia Post Evaluation  Last Vitals:  Vitals:   09/13/18 0532 09/13/18 1138  BP: 122/80 128/64  Pulse: 65 71  Resp: 15 18  Temp: 36.7 C 36.4 C  SpO2: 100% 99%    Last Pain:  Vitals:   09/13/18 1138  TempSrc: Oral  PainSc:                  Nolon Nations

## 2018-09-17 LAB — AEROBIC/ANAEROBIC CULTURE W GRAM STAIN (SURGICAL/DEEP WOUND)

## 2018-09-17 LAB — AEROBIC/ANAEROBIC CULTURE (SURGICAL/DEEP WOUND)

## 2018-09-19 ENCOUNTER — Telehealth: Payer: Self-pay
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
## 2018-09-19 NOTE — Telephone Encounter (Signed)
Yes. When does he see me again

## 2018-09-22 NOTE — Telephone Encounter (Signed)
Looks like 09/25/2018

## 2018-09-24 ENCOUNTER — Telehealth (HOSPITAL_COMMUNITY): Payer: Self-pay | Admitting: Rehabilitation

## 2018-09-24 ENCOUNTER — Other Ambulatory Visit: Payer: Self-pay

## 2018-09-24 DIAGNOSIS — I779 Disorder of arteries and arterioles, unspecified: Secondary | ICD-10-CM

## 2018-09-24 NOTE — Telephone Encounter (Signed)
The above patient or their representative was contacted and gave the following answers to these questions:         Do you have any of the following symptoms? No  Fever                    Cough                   Shortness of breath  Do  you have any of the following other symptoms? No   muscle pain         vomiting,        diarrhea        rash         weakness        red eye        abdominal pain         bruising          bruising or bleeding              joint pain           severe headache    Have you been in contact with someone who was or has been sick in the past 2 weeks? No  Yes                 Unsure                         Unable to assess   Does the person that you were in contact with have any of the following symptoms? No  Cough         shortness of breath           muscle pain         vomiting,            diarrhea            rash            weakness           fever            red eye           abdominal pain           bruising  or  bleeding                joint pain                severe headache               Have you  or someone you have been in contact with traveled internationally in th last month?  No       If yes, which countries?   Have you  or someone you have been in contact with traveled outside [place] in th last month?  No       If yes, which state and city?   COMMENTS OR ACTION PLAN FOR THIS PATIENT:          

## 2018-09-25 ENCOUNTER — Other Ambulatory Visit: Payer: Self-pay

## 2018-09-25 ENCOUNTER — Ambulatory Visit (INDEPENDENT_AMBULATORY_CARE_PROVIDER_SITE_OTHER): Payer: Self-pay | Admitting: Family

## 2018-09-25 ENCOUNTER — Ambulatory Visit (INDEPENDENT_AMBULATORY_CARE_PROVIDER_SITE_OTHER)
Admission: RE | Admit: 2018-09-25 | Discharge: 2018-09-25 | Disposition: A | Discharge status: Home / Self Care | Payer: BC Managed Care – PPO | Source: Ambulatory Visit | Attending: Family | Admitting: Family

## 2018-09-25 ENCOUNTER — Ambulatory Visit (HOSPITAL_COMMUNITY)
Admission: RE | Admit: 2018-09-25 | Discharge: 2018-09-25 | Disposition: A | Discharge status: Home / Self Care | Payer: BC Managed Care – PPO | Source: Ambulatory Visit | Attending: Family | Admitting: Family

## 2018-09-25 ENCOUNTER — Encounter: Payer: Self-pay | Admitting: Family

## 2018-09-25 VITALS — BP 147/90 | HR 75 | Temp 97.2°F | Resp 18 | Ht 73.0 in | Wt 235.0 lb

## 2018-09-25 DIAGNOSIS — I779 Disorder of arteries and arterioles, unspecified: Secondary | ICD-10-CM

## 2018-09-25 DIAGNOSIS — M79605 Pain in left leg: Secondary | ICD-10-CM

## 2018-09-25 MED ORDER — TRAMADOL HCL 50 MG PO TABS: 50.0000 mg | ORAL_TABLET | Freq: Four times a day (QID) | ORAL | 0 refills | Status: DC | PRN
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
## 2018-09-25 NOTE — Patient Instructions (Signed)
Peripheral Vascular Disease  Peripheral vascular disease (PVD) is a disease of the blood vessels that are not part of your heart and brain. A simple term for PVD is poor circulation. In most cases, PVD narrows the blood vessels that carry blood from your heart to the rest of your body. This can reduce the supply of blood to your arms, legs, and internal organs, like your stomach or kidneys. However, PVD most often affects a person's lower legs and feet. Without treatment, PVD tends to get worse. PVD can also lead to acute ischemic limb. This is when an arm or leg suddenly cannot get enough blood. This is a medical emergency. Follow these instructions at home: Lifestyle  Do not use any products that contain nicotine or tobacco, such as cigarettes and e-cigarettes. If you need help quitting, ask your doctor.  Lose weight if you are overweight. Or, stay at a healthy weight as told by your doctor.  Eat a diet that is low in fat and cholesterol. If you need help, ask your doctor.  Exercise regularly. Ask your doctor for activities that are right for you. General instructions  Take over-the-counter and prescription medicines only as told by your doctor.  Take good care of your feet: ? Wear comfortable shoes that fit well. ? Check your feet often for any cuts or sores.  Keep all follow-up visits as told by your doctor This is important. Contact a doctor if:  You have cramps in your legs when you walk.  You have leg pain when you are at rest.  You have coldness in a leg or foot.  Your skin changes.  You are unable to get or have an erection (erectile dysfunction).  You have cuts or sores on your feet that do not heal. Get help right away if:  Your arm or leg turns cold, numb, and blue.  Your arms or legs become red, warm, swollen, painful, or numb.  You have chest pain.  You have trouble breathing.  You suddenly have weakness in your face, arm, or leg.  You become very  confused or you cannot speak.  You suddenly have a very bad headache.  You suddenly cannot see. Summary  Peripheral vascular disease (PVD) is a disease of the blood vessels.  A simple term for PVD is poor circulation. Without treatment, PVD tends to get worse.  Treatment may include exercise, low fat and low cholesterol diet, and quitting smoking. This information is not intended to replace advice given to you by your health care provider. Make sure you discuss any questions you have with your health care provider. Document Released: 09/05/2009 Document Revised: 07/19/2016 Document Reviewed: 07/19/2016 Elsevier Interactive Patient Education  2019 [employer]  

## 2018-09-26 ENCOUNTER — Encounter (HOSPITAL_COMMUNITY): Payer: BC Managed Care – PPO

## 2018-09-26 ENCOUNTER — Other Ambulatory Visit (HOSPITAL_COMMUNITY): Payer: BC Managed Care – PPO

## 2018-09-26 ENCOUNTER — Ambulatory Visit: Payer: BC Managed Care – PPO

## 2018-09-30 DIAGNOSIS — Z0279 Encounter for issue of other medical certificate: Secondary | ICD-10-CM

## 2018-10-18 ENCOUNTER — Other Ambulatory Visit: Payer: Self-pay

## 2018-10-20 ENCOUNTER — Other Ambulatory Visit: Payer: Self-pay

## 2018-10-24 ENCOUNTER — Telehealth: Payer: Self-pay | Admitting: *Deleted

## 2018-10-24 NOTE — Telephone Encounter (Signed)
[name] called in today while his [facility] nurse was there to change his wound VAC. Per the nurse, the wound has almost healed, measures .8cm x 1cm x .01 depth. He is afebrile and has minimal drainage. I gave her orders to discontinue his VAC and start wet to dry dressing.

## 2018-10-26 ENCOUNTER — Other Ambulatory Visit: Payer: Self-pay | Admitting: Vascular Surgery

## 2018-10-26 MED ORDER — OXYCODONE-ACETAMINOPHEN 5-325 MG PO TABS: 1.0000 | ORAL_TABLET | ORAL | 0 refills | Status: DC | PRN

## 2018-10-27 ENCOUNTER — Other Ambulatory Visit: Payer: Self-pay | Admitting: *Deleted

## 2018-10-27 ENCOUNTER — Other Ambulatory Visit: Payer: Self-pay | Admitting: Vascular Surgery

## 2018-10-27 ENCOUNTER — Telehealth: Payer: Self-pay | Admitting: Vascular Surgery

## 2018-10-27 DIAGNOSIS — I998 Other disorder of circulatory system: Secondary | ICD-10-CM

## 2018-10-27 DIAGNOSIS — I70229 Atherosclerosis of native arteries of extremities with rest pain, unspecified extremity: Secondary | ICD-10-CM

## 2018-10-27 DIAGNOSIS — Z9889 Other specified postprocedural states: Secondary | ICD-10-CM

## 2018-10-27 DIAGNOSIS — Z959 Presence of cardiac and vascular implant and graft, unspecified: Principal | ICD-10-CM

## 2018-10-27 MED ORDER — RIVAROXABAN 20 MG PO TABS: 20.0000 mg | ORAL_TABLET | Freq: Every day | ORAL | 6 refills | Status: DC

## 2018-10-27 NOTE — Telephone Encounter (Signed)
The patient called over the weekend stating that his Ultram was not controlling the pain in his open amputation after the [facility] was removed and he is now back to saline dressing changes.  I reviewed his chart and he is also his registry.  He is not being given narcotics from any other source.  Did send a electronic refill of Percocet No. 30 to his pharmacy and spoke with him Sunday morning to let him know that this was sent.  He has follow-up scheduled in our office

## 2018-10-31 ENCOUNTER — Other Ambulatory Visit: Payer: Self-pay | Admitting: Family

## 2018-11-07 ENCOUNTER — Other Ambulatory Visit: Payer: Self-pay

## 2018-11-07 ENCOUNTER — Encounter: Payer: Self-pay | Admitting: Vascular Surgery

## 2018-11-07 ENCOUNTER — Ambulatory Visit (INDEPENDENT_AMBULATORY_CARE_PROVIDER_SITE_OTHER): Payer: Self-pay | Admitting: Vascular Surgery

## 2018-11-07 VITALS — BP 131/79 | HR 83 | Temp 98.4°F | Resp 20 | Ht 73.0 in | Wt 238.0 lb

## 2018-11-07 DIAGNOSIS — Z9889 Other specified postprocedural states: Secondary | ICD-10-CM

## 2018-11-07 DIAGNOSIS — Z959 Presence of cardiac and vascular implant and graft, unspecified: Secondary | ICD-10-CM

## 2018-11-07 DIAGNOSIS — I998 Other disorder of circulatory system: Secondary | ICD-10-CM

## 2018-11-07 DIAGNOSIS — I70229 Atherosclerosis of native arteries of extremities with rest pain, unspecified extremity: Secondary | ICD-10-CM

## 2018-11-07 NOTE — Progress Notes (Signed)
    Subjective:     Patient ID: [name], male   DOB: 11-21-1950, 68 y.o.   MRN: [id number]  HPI previous left fem-AT bypass and amputation of toes 2-5 for gangrene. Now without wound vac. Is on plavix for previous popliteal stent that is occluded and xarelto for dvt diagnosed 06/28/2018. Wants to return to work.   Review of Systems Wound healing.    Objective:   Physical Exam  Vitals:   11/07/18 0911  BP: 131/79  Pulse: 83  Resp: 20  Temp: 98.4 F (36.9 C)  SpO2: 97%   aaox3 Palpable left AT ankle        Assessment:     S/p left fem-AT bypass with vein and amp toes 2-5 for gangrene, now healing.    Plan:     plavix to aspirin when current plavix completed xarelto to complete July 1, ok to hold xarelto for colonoscopy or dental procedure in the interim and restart when safe Long term therapy will be 81mg  aspirin F/u in 2-3 months with duplex and abi     Zada Haser C. Donzetta Matters, MD [facility] [place] Office: [phone] Pager: [phone]

## 2018-11-12 ENCOUNTER — Encounter: Payer: Self-pay | Admitting: Vascular Surgery

## 2018-11-13 ENCOUNTER — Telehealth: Payer: Self-pay

## 2018-11-13 NOTE — Telephone Encounter (Signed)
Pt called and said that he wanted a refill on his Oxycodone and Tramadol. Advised pt that given the length of time out from the procedure and that it is not severe per pt, we would recommend he start trying to use OTC pain medication to manage his pain. He will try and call back if further issues to see if getting in for an evaluation is warranted.   York Cerise, CMA

## 2018-11-18 ENCOUNTER — Ambulatory Visit (HOSPITAL_COMMUNITY)
Admission: RE | Admit: 2018-11-18 | Discharge: 2018-11-18 | Disposition: A | Discharge status: Home / Self Care | Payer: BC Managed Care – PPO | Source: Ambulatory Visit | Attending: Vascular Surgery | Admitting: Vascular Surgery

## 2018-11-18 ENCOUNTER — Telehealth: Payer: Self-pay | Admitting: *Deleted

## 2018-11-18 ENCOUNTER — Other Ambulatory Visit: Payer: Self-pay

## 2018-11-18 ENCOUNTER — Encounter: Payer: Self-pay | Admitting: Vascular Surgery

## 2018-11-18 ENCOUNTER — Ambulatory Visit (INDEPENDENT_AMBULATORY_CARE_PROVIDER_SITE_OTHER): Payer: Self-pay | Admitting: Vascular Surgery

## 2018-11-18 VITALS — BP 119/74 | HR 84 | Temp 99.0°F | Resp 20 | Ht 73.0 in | Wt 238.0 lb

## 2018-11-18 DIAGNOSIS — Z9889 Other specified postprocedural states: Secondary | ICD-10-CM

## 2018-11-18 DIAGNOSIS — I998 Other disorder of circulatory system: Secondary | ICD-10-CM

## 2018-11-18 DIAGNOSIS — M79662 Pain in left lower leg: Secondary | ICD-10-CM | POA: Diagnosis present

## 2018-11-18 DIAGNOSIS — M7989 Other specified soft tissue disorders: Secondary | ICD-10-CM | POA: Diagnosis present

## 2018-11-18 DIAGNOSIS — M79605 Pain in left leg: Secondary | ICD-10-CM

## 2018-11-18 DIAGNOSIS — Z959 Presence of cardiac and vascular implant and graft, unspecified: Secondary | ICD-10-CM

## 2018-11-18 DIAGNOSIS — I70229 Atherosclerosis of native arteries of extremities with rest pain, unspecified extremity: Secondary | ICD-10-CM

## 2018-11-18 MED ORDER — TRAMADOL HCL 50 MG PO TABS: 50.0000 mg | ORAL_TABLET | Freq: Four times a day (QID) | ORAL | 0 refills | Status: DC | PRN

## 2018-11-18 NOTE — Telephone Encounter (Signed)
-----   Message from Angelia Mould, MD sent at 11/17/2018 10:24 AM EDT ----- Regarding: office visit Tuesday This patient had a left femoral to anterior tibial artery bypass by Dr. [name] on 07/01/2018 with amputation of 4 of his toes.  He was just seen 10 days ago in the office.  He called today complaining of gradually increasing pain behind his knee which sounds like this is chronic.    I suggested that he come to the emergency department to get a duplex however he did not want to go to the emergency department because of the virus issue.  Therefore I have asked that we call him tomorrow and try to get him into this see either the nurse practitioner, Dr. early, or Dr. Carlis Abbott.    He will need a venous duplex scan of the left leg to rule out a DVT and to look for perhaps a hematoma   thank you CD

## 2018-11-18 NOTE — Progress Notes (Signed)
Patient name: [name] MRN: [id number] DOB: [birth year] Sex: male  REASON FOR VISIT: Evaluation of left leg discomfort and swelling  HPI: [name] is a 68 y.o. male seen today for evaluation of increasing discomfort and swelling in his left leg.  He has a very complex past medical history.  Had a left femoral to anterior tibial bypass and had open transmetatarsal amputation.  He reports pain behind his knee and increased swelling make it difficult for him to even across his knee.  He called over the weekend and was recommended by Dr. Scot Dock that he present to the emergency room for evaluation.  He elected not to do this and presents today to the office.  Current Outpatient Medications  Medication Sig Dispense Refill  . allopurinol (ZYLOPRIM) 100 MG tablet Take 100 mg by mouth daily.    . carvedilol (COREG) 12.5 MG tablet Take 12.5 mg by mouth 2 (two) times daily with a meal. Taking once daily around noon    . clobetasol cream (TEMOVATE) 8.25 % Apply 1 application topically 2 (two) times daily as needed (rash).    . clopidogrel (PLAVIX) 75 MG tablet Take 1 tablet (75 mg total) by mouth daily. 90 tablet 2  . colchicine 0.6 MG tablet Take 0.6 mg by mouth 2 (two) times daily.    . dorzolamide-timolol (COSOPT) 22.3-6.8 MG/ML ophthalmic solution Place 1 drop into both eyes 2 (two) times daily.  3  . insulin aspart (NOVOLOG) 100 UNIT/ML injection Inject 0-15 Units into the skin 3 (three) times daily with meals. For glucose 120-150 please use 2 units, for 151 to 200 use 3 units, for 201 to 250 use 5 units, for 251 to 300 use 8 units, for 301 to 350 use 12 units, for 351 or greater use 15 units. 10 mL 11  . Insulin Disposable Pump (V-GO 30) KIT See admin instructions. Uses Novolog insulin in pump  6  . l-methylfolate-B6-B12 (METANX) 3-35-2 MG TABS tablet Take 1 tablet by mouth 2 (two) times daily.    Marland Kitchen losartan (COZAAR) 100 MG tablet Take 100 mg by mouth daily  at 2 PM.    . metFORMIN (GLUCOPHAGE) 1000 MG tablet Take 1 tablet (1,000 mg total) by mouth 2 (two) times daily with a meal. (Patient taking differently: Take 1,000 mg by mouth daily. )  5  . Multiple Vitamin (MULTIVITAMIN WITH MINERALS) TABS tablet Take 1 tablet by mouth 2 (two) times daily.    . Multiple Vitamins-Minerals (EQL MEGA SELECT MENS PO) Take 1 tablet by mouth 2 (two) times daily.    . pravastatin (PRAVACHOL) 40 MG tablet Take 40 mg by mouth at bedtime.   11  . pregabalin (LYRICA) 50 MG capsule Take 50-100 mg by mouth at bedtime.    . rivaroxaban (XARELTO) 20 MG TABS tablet Take 1 tablet (20 mg total) by mouth daily with supper for 30 days. Please start in 07/25/2018 30 tablet 6  . TRAVATAN Z 0.004 % SOLN ophthalmic solution Place 1 drop into both eyes at bedtime.   2  . triamterene-hydrochlorothiazide (MAXZIDE-25) 37.5-25 MG per tablet Take 1 tablet by mouth daily at 2 PM.     . VICTOZA 18 MG/3ML SOPN Inject 0.2 mLs (1.2 mg total) into the skin daily. Inject once daily at the same time (Patient taking differently: Inject 1.2 mg into the skin at bedtime. Inject once daily at the same time) 2 pen 3  . XARELTO 20 MG TABS tablet TAKE 1 TABLET(20  MG) BY MOUTH DAILY WITH SUPPER 30 tablet 6  . insulin glargine (LANTUS) 100 UNIT/ML injection Inject 0.2 mLs (20 Units total) into the skin at bedtime for 30 days. 6 mL 0  . oxyCODONE-acetaminophen (PERCOCET/ROXICET) 5-325 MG tablet Take 1 tablet by mouth every 4 (four) hours as needed for moderate pain or severe pain. (Patient not taking: Reported on 11/18/2018) 30 tablet 0  . pantoprazole (PROTONIX) 40 MG tablet Take 1 tablet (40 mg total) by mouth daily for 15 days. 15 tablet 0  . traMADol (ULTRAM) 50 MG tablet Take 1 tablet (50 mg total) by mouth every 6 (six) hours as needed. (Patient not taking: Reported on 11/18/2018) 20 tablet 0   No current facility-administered medications for this visit.      PHYSICAL EXAM: Vitals:   11/18/18 1523   BP: 119/74  Pulse: 84  Resp: 20  Temp: 99 F (37.2 C)  SpO2: 100%  Weight: 238 lb (108 kg)  Height: 6' 1"  (1.854 m)    GENERAL: The patient is a well-nourished male, in no acute distress. The vital signs are documented above. Chronic swelling in his left leg with marked hemosiderin deposit.  His open amputation is actually healing quite nicely of his second third fourth and fifth toe.  Duplex shows no DVT.  He does have a 4 x 2 cm collection in the popliteal fossa which is not associated with arterial or venous structures  MEDICAL ISSUES: Increasing pain and swelling.  I explained the critical importance of elevation and have recommended that he begin wearing an Ace wrap since it be difficult for him to wear a compression garment.  He will see Dr. Donzetta Matters for continued follow-up in 3 weeks.  Requested refill on his Ultram which I have done 30 no refills   Rosetta Posner, MD [facility] Tel [phone] Pager [phone]

## 2018-11-28 ENCOUNTER — Ambulatory Visit (INDEPENDENT_AMBULATORY_CARE_PROVIDER_SITE_OTHER): Payer: BC Managed Care – PPO | Admitting: Physician Assistant

## 2018-11-28 ENCOUNTER — Other Ambulatory Visit: Payer: Self-pay

## 2018-11-28 ENCOUNTER — Encounter: Payer: Self-pay | Admitting: Physician Assistant

## 2018-11-28 VITALS — BP 98/59 | HR 76 | Temp 98.6°F

## 2018-11-28 DIAGNOSIS — S81802D Unspecified open wound, left lower leg, subsequent encounter: Secondary | ICD-10-CM | POA: Diagnosis not present

## 2018-11-28 DIAGNOSIS — M7989 Other specified soft tissue disorders: Secondary | ICD-10-CM

## 2018-11-28 DIAGNOSIS — M79662 Pain in left lower leg: Secondary | ICD-10-CM | POA: Diagnosis not present

## 2018-11-28 NOTE — Progress Notes (Signed)
POST OPERATIVE OFFICE NOTE    CC:  F/u for surgery  HPI:  This is a 68 y.o. male who is s/p  CC: Follow up peripheral artery occlusive disease  History of Present Illness [name] a 68 y.o.malewho is s/p:  1.Harvest left greater saphenous vein 2.Left peroneal artery thrombectomy 3.Left SFA to peroneal artery bypass with non-reversed greater saphenous vein 4.Left lower extremity angiogram 5.Left SFA to anterior tibial artery bypass with ipsilateral non-reversed greater saphenous vein 6. Amputation left toes 2 through 5 On 07-01-18 by Dr. Donzetta Matters and Dr. Oneida Alar for critical left lower extremity ischemia with necrotic toes.   Hehas undergone stenting of the left popliteal artery that subsequently occluded;[name] has undergone attempted percutaneous thrombectomy on multiple occasions with no success.  [name] is on plavix for previous popliteal stent that is occluded and xarelto for dvt diagnosed 06/28/2018.  Once the Plavix runs out her will switch to aspirin and the Xarelto can be discontinued December 24 2018.     [name] was seen By Dr. Donnetta Hutching on 11/18/2018 for evaluation of increasing discomfort and swelling in his left leg.  Duplex shows no DVT.  [name] does have a 4 x 2 cm collection in the popliteal fossa which is not associated with arterial or venous structures. [name] recommended elevation and  that [name] begin wearing an Ace wrap since it be difficult for him to wear a compression garment.  [name] returns today stating that [name] has developed a new wound on the medial calf in the past 4-5 days.  [name] used the ace wrap for 8 hours after seeing Dr. Donnetta Hutching.  [name] denise fever, chills, SOB.and CP.   [name] continues to take Xarelto and plavix daily.    No Known Allergies  Current Outpatient Medications  Medication Sig Dispense Refill  . allopurinol (ZYLOPRIM) 100 MG tablet Take 100 mg by mouth daily.    . carvedilol (COREG) 12.5 MG tablet Take 12.5 mg by mouth 2 (two) times daily with a meal. Taking once daily  around noon    . clobetasol cream (TEMOVATE) 4.69 % Apply 1 application topically 2 (two) times daily as needed (rash).    . clopidogrel (PLAVIX) 75 MG tablet Take 1 tablet (75 mg total) by mouth daily. 90 tablet 2  . colchicine 0.6 MG tablet Take 0.6 mg by mouth 2 (two) times daily.    . dorzolamide-timolol (COSOPT) 22.3-6.8 MG/ML ophthalmic solution Place 1 drop into both eyes 2 (two) times daily.  3  . insulin aspart (NOVOLOG) 100 UNIT/ML injection Inject 0-15 Units into the skin 3 (three) times daily with meals. For glucose 120-150 please use 2 units, for 151 to 200 use 3 units, for 201 to 250 use 5 units, for 251 to 300 use 8 units, for 301 to 350 use 12 units, for 351 or greater use 15 units. 10 mL 11  . Insulin Disposable Pump (V-GO 30) KIT See admin instructions. Uses Novolog insulin in pump  6  . insulin glargine (LANTUS) 100 UNIT/ML injection Inject 0.2 mLs (20 Units total) into the skin at bedtime for 30 days. 6 mL 0  . l-methylfolate-B6-B12 (METANX) 3-35-2 MG TABS tablet Take 1 tablet by mouth 2 (two) times daily.    Marland Kitchen losartan (COZAAR) 100 MG tablet Take 100 mg by mouth daily at 2 PM.    . metFORMIN (GLUCOPHAGE) 1000 MG tablet Take 1 tablet (1,000 mg total) by mouth 2 (two) times daily with a meal. (Patient taking differently: Take 1,000 mg  by mouth daily. )  5  . Multiple Vitamin (MULTIVITAMIN WITH MINERALS) TABS tablet Take 1 tablet by mouth 2 (two) times daily.    . Multiple Vitamins-Minerals (EQL MEGA SELECT MENS PO) Take 1 tablet by mouth 2 (two) times daily.    Marland Kitchen oxyCODONE-acetaminophen (PERCOCET/ROXICET) 5-325 MG tablet Take 1 tablet by mouth every 4 (four) hours as needed for moderate pain or severe pain. (Patient not taking: Reported on 11/18/2018) 30 tablet 0  . pantoprazole (PROTONIX) 40 MG tablet Take 1 tablet (40 mg total) by mouth daily for 15 days. 15 tablet 0  . pravastatin (PRAVACHOL) 40 MG tablet Take 40 mg by mouth at bedtime.   11  . pregabalin (LYRICA) 50 MG capsule  Take 50-100 mg by mouth at bedtime.    . rivaroxaban (XARELTO) 20 MG TABS tablet Take 1 tablet (20 mg total) by mouth daily with supper for 30 days. Please start in 07/25/2018 30 tablet 6  . traMADol (ULTRAM) 50 MG tablet Take 1 tablet (50 mg total) by mouth every 6 (six) hours as needed. 30 tablet 0  . TRAVATAN Z 0.004 % SOLN ophthalmic solution Place 1 drop into both eyes at bedtime.   2  . triamterene-hydrochlorothiazide (MAXZIDE-25) 37.5-25 MG per tablet Take 1 tablet by mouth daily at 2 PM.     . VICTOZA 18 MG/3ML SOPN Inject 0.2 mLs (1.2 mg total) into the skin daily. Inject once daily at the same time (Patient taking differently: Inject 1.2 mg into the skin at bedtime. Inject once daily at the same time) 2 pen 3  . XARELTO 20 MG TABS tablet TAKE 1 TABLET(20 MG) BY MOUTH DAILY WITH SUPPER 30 tablet 6   No current facility-administered medications for this visit.      ROS:  See HPI     No purulent drainage or erythema The wound was debrided of yellow eschar wet to dry packing ordered.   Physical Exam: Vitals:   11/28/18 1551  BP: (!) 98/59  Pulse: 76  Temp: 98.6 F (37 C)  SpO2: 98%       Incision:  Left toe amputation site is healing well, upper thigh incisions have healed completely at this time. Extremities:  Doppler AT/DP left LE intact   Assessment/Plan:  This is a 68 y.o. male who is s/p:left fem-peroneal bypass with GSV Healing incision in the thigh and toe amputations area. New venous wound medial calf.  [name] has [place] and will continue with wet to dry dressing changes daily.  [name] will f/u in 1 week for wound exam.   F/u in 2-3 months with duplex and abi   [name] , PA-C [facility] [phone]  Clinic MD:  Donzetta Matters

## 2018-12-05 ENCOUNTER — Ambulatory Visit (INDEPENDENT_AMBULATORY_CARE_PROVIDER_SITE_OTHER): Payer: BC Managed Care – PPO | Admitting: Physician Assistant

## 2018-12-05 ENCOUNTER — Other Ambulatory Visit: Payer: Self-pay

## 2018-12-05 VITALS — BP 84/54 | HR 81 | Temp 98.7°F | Wt 238.0 lb

## 2018-12-05 DIAGNOSIS — I998 Other disorder of circulatory system: Secondary | ICD-10-CM | POA: Diagnosis not present

## 2018-12-05 DIAGNOSIS — T8189XD Other complications of procedures, not elsewhere classified, subsequent encounter: Secondary | ICD-10-CM | POA: Diagnosis not present

## 2018-12-05 DIAGNOSIS — I70229 Atherosclerosis of native arteries of extremities with rest pain, unspecified extremity: Secondary | ICD-10-CM

## 2018-12-05 MED ORDER — HYDROCODONE-ACETAMINOPHEN 5-325 MG PO TABS: 1.0000 | ORAL_TABLET | Freq: Four times a day (QID) | ORAL | 0 refills | Status: DC | PRN

## 2018-12-05 NOTE — Progress Notes (Signed)
Established Previous Bypass   History of Present Illness   [name] is a 68 y.o. (01/26/1951) male who presents with chief complaint: Nonhealing wound left medial lower leg.  He is status post left SFA to anterior tibial artery bypass with amputation of toes 2 through 5 by Dr. Donzetta Matters in January of this year.  He then had subsequent debridement of toe amputation site in March by Dr. Trula Slade.  Over the past several weeks he has developed a wound at the distal pole of his popliteal incision.  Venous duplex 5/26 was negative and showed a fluid collection in the popliteal space.  Patient states his popliteal space is causing him a lot of pain and is keeping him nearly immobile.  He was last seen in office last week and was started on wet to dry dressing changes for his open wound.  Toe amputation site is well-healed.  He continues to take Xarelto.  He is attempting to elevate his leg during the day however has not tried wearing any compression.  Current Outpatient Medications  Medication Sig Dispense Refill  . allopurinol (ZYLOPRIM) 100 MG tablet Take 100 mg by mouth daily.    . carvedilol (COREG) 12.5 MG tablet Take 12.5 mg by mouth 2 (two) times daily with a meal. Taking once daily around noon    . clobetasol cream (TEMOVATE) 2.75 % Apply 1 application topically 2 (two) times daily as needed (rash).    . clopidogrel (PLAVIX) 75 MG tablet Take 1 tablet (75 mg total) by mouth daily. 90 tablet 2  . colchicine 0.6 MG tablet Take 0.6 mg by mouth 2 (two) times daily.    . dorzolamide-timolol (COSOPT) 22.3-6.8 MG/ML ophthalmic solution Place 1 drop into both eyes 2 (two) times daily.  3  . insulin aspart (NOVOLOG) 100 UNIT/ML injection Inject 0-15 Units into the skin 3 (three) times daily with meals. For glucose 120-150 please use 2 units, for 151 to 200 use 3 units, for 201 to 250 use 5 units, for 251 to 300 use 8 units, for 301 to 350 use 12 units, for 351 or greater use 15 units. 10 mL 11  . Insulin  Disposable Pump (V-GO 30) KIT See admin instructions. Uses Novolog insulin in pump  6  . l-methylfolate-B6-B12 (METANX) 3-35-2 MG TABS tablet Take 1 tablet by mouth 2 (two) times daily.    Marland Kitchen losartan (COZAAR) 100 MG tablet Take 100 mg by mouth daily at 2 PM.    . metFORMIN (GLUCOPHAGE) 1000 MG tablet Take 1 tablet (1,000 mg total) by mouth 2 (two) times daily with a meal. (Patient taking differently: Take 1,000 mg by mouth daily. )  5  . Multiple Vitamin (MULTIVITAMIN WITH MINERALS) TABS tablet Take 1 tablet by mouth 2 (two) times daily.    . Multiple Vitamins-Minerals (EQL MEGA SELECT MENS PO) Take 1 tablet by mouth 2 (two) times daily.    Marland Kitchen oxyCODONE-acetaminophen (PERCOCET/ROXICET) 5-325 MG tablet Take 1 tablet by mouth every 4 (four) hours as needed for moderate pain or severe pain. 30 tablet 0  . pravastatin (PRAVACHOL) 40 MG tablet Take 40 mg by mouth at bedtime.   11  . pregabalin (LYRICA) 50 MG capsule Take 50-100 mg by mouth at bedtime.    . traMADol (ULTRAM) 50 MG tablet Take 1 tablet (50 mg total) by mouth every 6 (six) hours as needed. 30 tablet 0  . TRAVATAN Z 0.004 % SOLN ophthalmic solution Place 1 drop into both eyes  at bedtime.   2  . triamterene-hydrochlorothiazide (MAXZIDE-25) 37.5-25 MG per tablet Take 1 tablet by mouth daily at 2 PM.     . VICTOZA 18 MG/3ML SOPN Inject 0.2 mLs (1.2 mg total) into the skin daily. Inject once daily at the same time (Patient taking differently: Inject 1.2 mg into the skin at bedtime. Inject once daily at the same time) 2 pen 3  . XARELTO 20 MG TABS tablet TAKE 1 TABLET(20 MG) BY MOUTH DAILY WITH SUPPER 30 tablet 6  . insulin glargine (LANTUS) 100 UNIT/ML injection Inject 0.2 mLs (20 Units total) into the skin at bedtime for 30 days. 6 mL 0  . pantoprazole (PROTONIX) 40 MG tablet Take 1 tablet (40 mg total) by mouth daily for 15 days. 15 tablet 0  . rivaroxaban (XARELTO) 20 MG TABS tablet Take 1 tablet (20 mg total) by mouth daily with supper for 30  days. Please start in 07/25/2018 30 tablet 6   No current facility-administered medications for this visit.     On ROS today: 10 system ROS is negative unless otherwise noted in HPI   Physical Examination   Vitals:   12/05/18 0851  BP: (!) 84/54  Pulse: 81  Temp: 98.7 F (37.1 C)  TempSrc: Temporal  SpO2: 97%  Weight: 238 lb (108 kg)   Body mass index is 31.4 kg/m.  General Alert, O x 3, WD, NAD  Pulmonary Sym exp, good B air movt, CTA B  Cardiac RRR, Nl S1, S2  Gastro- intestinal soft, non-distended, non-tender to palpation,   Musculo- skeletal M/S 5/5 throughout  , Left lower leg edematous with stasis pigmentation changes, left DP signal brisk by Doppler; left toes amputation site healed; wound pictured below 6 cm in diameter     Neurologic Pain and light touch intact in extremities , Motor exam as listed above    Medical Decision Making   [name] is a 68 y.o. male status post left SFA to anterior tibial artery bypass in January now with unrelated wound of medial left lower leg   Patent bypass and well-perfused left foot with well-healed toe amputation site  Wound healing complicated by presence of venous insufficiency and edema  Wound bed is granulating this week and showing some signs of healing  Encouraged continued wet-to-dry dressing changes with light Ace wrap and frequent elevation of left leg  Norco prescribed for pain  Recheck wound every 2 weeks  If wound continues to improve may consider application of Unna boot   Dagoberto Ligas PA-C [facility] [place] Office: [phone]  Clinic MD: Dr. Donzetta Matters evaluated the wound with me today

## 2018-12-19 ENCOUNTER — Encounter: Payer: Self-pay | Admitting: Vascular Surgery

## 2018-12-19 ENCOUNTER — Inpatient Hospital Stay (HOSPITAL_COMMUNITY): Payer: BC Managed Care – PPO | Admitting: Certified Registered Nurse Anesthetist

## 2018-12-19 ENCOUNTER — Ambulatory Visit: Payer: BC Managed Care – PPO | Admitting: Vascular Surgery

## 2018-12-19 ENCOUNTER — Other Ambulatory Visit: Payer: Self-pay

## 2018-12-19 ENCOUNTER — Inpatient Hospital Stay (HOSPITAL_COMMUNITY)
Admission: EM | Admit: 2018-12-19 | Discharge: 2018-12-26 | DRG: 854 | Disposition: A | Discharge status: Rehabilitation Facility | Payer: BC Managed Care – PPO | Source: Ambulatory Visit | Attending: Internal Medicine | Admitting: Internal Medicine

## 2018-12-19 ENCOUNTER — Encounter (HOSPITAL_COMMUNITY): Payer: Self-pay | Admitting: Emergency Medicine

## 2018-12-19 ENCOUNTER — Encounter (HOSPITAL_COMMUNITY)
Admission: EM | Disposition: A | Discharge status: Rehabilitation Facility | Payer: Self-pay | Source: Ambulatory Visit | Attending: Internal Medicine

## 2018-12-19 ENCOUNTER — Emergency Department (HOSPITAL_COMMUNITY): Payer: BC Managed Care – PPO

## 2018-12-19 VITALS — BP 79/52 | HR 83 | Temp 97.8°F | Resp 20 | Ht 73.0 in | Wt 238.0 lb

## 2018-12-19 DIAGNOSIS — N183 Chronic kidney disease, stage 3 unspecified: Secondary | ICD-10-CM | POA: Diagnosis present

## 2018-12-19 DIAGNOSIS — E7800 Pure hypercholesterolemia, unspecified: Secondary | ICD-10-CM | POA: Diagnosis present

## 2018-12-19 DIAGNOSIS — D509 Iron deficiency anemia, unspecified: Secondary | ICD-10-CM | POA: Diagnosis present

## 2018-12-19 DIAGNOSIS — E669 Obesity, unspecified: Secondary | ICD-10-CM | POA: Diagnosis not present

## 2018-12-19 DIAGNOSIS — R5381 Other malaise: Secondary | ICD-10-CM | POA: Diagnosis present

## 2018-12-19 DIAGNOSIS — R799 Abnormal finding of blood chemistry, unspecified: Secondary | ICD-10-CM | POA: Diagnosis not present

## 2018-12-19 DIAGNOSIS — D631 Anemia in chronic kidney disease: Secondary | ICD-10-CM | POA: Diagnosis present

## 2018-12-19 DIAGNOSIS — A401 Sepsis due to streptococcus, group B: Secondary | ICD-10-CM | POA: Diagnosis present

## 2018-12-19 DIAGNOSIS — I10 Essential (primary) hypertension: Secondary | ICD-10-CM | POA: Diagnosis present

## 2018-12-19 DIAGNOSIS — E46 Unspecified protein-calorie malnutrition: Secondary | ICD-10-CM | POA: Diagnosis not present

## 2018-12-19 DIAGNOSIS — T8189XD Other complications of procedures, not elsewhere classified, subsequent encounter: Secondary | ICD-10-CM | POA: Diagnosis not present

## 2018-12-19 DIAGNOSIS — R52 Pain, unspecified: Secondary | ICD-10-CM | POA: Diagnosis not present

## 2018-12-19 DIAGNOSIS — D62 Acute posthemorrhagic anemia: Secondary | ICD-10-CM | POA: Diagnosis present

## 2018-12-19 DIAGNOSIS — L02416 Cutaneous abscess of left lower limb: Secondary | ICD-10-CM | POA: Diagnosis present

## 2018-12-19 DIAGNOSIS — E11628 Type 2 diabetes mellitus with other skin complications: Secondary | ICD-10-CM | POA: Insufficient documentation

## 2018-12-19 DIAGNOSIS — E871 Hypo-osmolality and hyponatremia: Secondary | ICD-10-CM | POA: Diagnosis present

## 2018-12-19 DIAGNOSIS — F331 Major depressive disorder, recurrent, moderate: Secondary | ICD-10-CM | POA: Diagnosis present

## 2018-12-19 DIAGNOSIS — R0989 Other specified symptoms and signs involving the circulatory and respiratory systems: Secondary | ICD-10-CM | POA: Diagnosis not present

## 2018-12-19 DIAGNOSIS — E1151 Type 2 diabetes mellitus with diabetic peripheral angiopathy without gangrene: Secondary | ICD-10-CM | POA: Diagnosis present

## 2018-12-19 DIAGNOSIS — A419 Sepsis, unspecified organism: Secondary | ICD-10-CM

## 2018-12-19 DIAGNOSIS — G5732 Lesion of lateral popliteal nerve, left lower limb: Secondary | ICD-10-CM | POA: Diagnosis not present

## 2018-12-19 DIAGNOSIS — Z7901 Long term (current) use of anticoagulants: Secondary | ICD-10-CM

## 2018-12-19 DIAGNOSIS — T8130XD Disruption of wound, unspecified, subsequent encounter: Secondary | ICD-10-CM

## 2018-12-19 DIAGNOSIS — E8809 Other disorders of plasma-protein metabolism, not elsewhere classified: Secondary | ICD-10-CM | POA: Diagnosis present

## 2018-12-19 DIAGNOSIS — T8142XA Infection following a procedure, deep incisional surgical site, initial encounter: Secondary | ICD-10-CM | POA: Diagnosis not present

## 2018-12-19 DIAGNOSIS — E1122 Type 2 diabetes mellitus with diabetic chronic kidney disease: Secondary | ICD-10-CM | POA: Diagnosis present

## 2018-12-19 DIAGNOSIS — I779 Disorder of arteries and arterioles, unspecified: Secondary | ICD-10-CM

## 2018-12-19 DIAGNOSIS — E860 Dehydration: Secondary | ICD-10-CM | POA: Diagnosis present

## 2018-12-19 DIAGNOSIS — E11621 Type 2 diabetes mellitus with foot ulcer: Secondary | ICD-10-CM | POA: Diagnosis present

## 2018-12-19 DIAGNOSIS — I739 Peripheral vascular disease, unspecified: Secondary | ICD-10-CM | POA: Diagnosis not present

## 2018-12-19 DIAGNOSIS — L97525 Non-pressure chronic ulcer of other part of left foot with muscle involvement without evidence of necrosis: Secondary | ICD-10-CM

## 2018-12-19 DIAGNOSIS — Z86718 Personal history of other venous thrombosis and embolism: Secondary | ICD-10-CM

## 2018-12-19 DIAGNOSIS — I959 Hypotension, unspecified: Secondary | ICD-10-CM | POA: Diagnosis present

## 2018-12-19 DIAGNOSIS — Z1159 Encounter for screening for other viral diseases: Secondary | ICD-10-CM

## 2018-12-19 DIAGNOSIS — N189 Chronic kidney disease, unspecified: Secondary | ICD-10-CM

## 2018-12-19 DIAGNOSIS — L089 Local infection of the skin and subcutaneous tissue, unspecified: Secondary | ICD-10-CM | POA: Diagnosis not present

## 2018-12-19 DIAGNOSIS — Z833 Family history of diabetes mellitus: Secondary | ICD-10-CM | POA: Diagnosis not present

## 2018-12-19 DIAGNOSIS — N179 Acute kidney failure, unspecified: Secondary | ICD-10-CM

## 2018-12-19 DIAGNOSIS — Z89422 Acquired absence of other left toe(s): Secondary | ICD-10-CM

## 2018-12-19 DIAGNOSIS — E611 Iron deficiency: Secondary | ICD-10-CM | POA: Diagnosis present

## 2018-12-19 DIAGNOSIS — IMO0001 Reserved for inherently not codable concepts without codable children: Secondary | ICD-10-CM

## 2018-12-19 DIAGNOSIS — E1142 Type 2 diabetes mellitus with diabetic polyneuropathy: Secondary | ICD-10-CM | POA: Diagnosis present

## 2018-12-19 DIAGNOSIS — G8929 Other chronic pain: Secondary | ICD-10-CM | POA: Diagnosis not present

## 2018-12-19 DIAGNOSIS — Z9641 Presence of insulin pump (external) (internal): Secondary | ICD-10-CM | POA: Diagnosis present

## 2018-12-19 DIAGNOSIS — R6520 Severe sepsis without septic shock: Secondary | ICD-10-CM

## 2018-12-19 DIAGNOSIS — E1169 Type 2 diabetes mellitus with other specified complication: Secondary | ICD-10-CM | POA: Diagnosis not present

## 2018-12-19 DIAGNOSIS — K5900 Constipation, unspecified: Secondary | ICD-10-CM | POA: Diagnosis present

## 2018-12-19 DIAGNOSIS — N184 Chronic kidney disease, stage 4 (severe): Secondary | ICD-10-CM | POA: Diagnosis not present

## 2018-12-19 DIAGNOSIS — S81802A Unspecified open wound, left lower leg, initial encounter: Secondary | ICD-10-CM

## 2018-12-19 DIAGNOSIS — Z87891 Personal history of nicotine dependence: Secondary | ICD-10-CM | POA: Diagnosis not present

## 2018-12-19 DIAGNOSIS — Z8611 Personal history of tuberculosis: Secondary | ICD-10-CM | POA: Diagnosis not present

## 2018-12-19 DIAGNOSIS — L97529 Non-pressure chronic ulcer of other part of left foot with unspecified severity: Secondary | ICD-10-CM | POA: Diagnosis present

## 2018-12-19 DIAGNOSIS — T148XXA Other injury of unspecified body region, initial encounter: Secondary | ICD-10-CM | POA: Diagnosis not present

## 2018-12-19 DIAGNOSIS — E08621 Diabetes mellitus due to underlying condition with foot ulcer: Secondary | ICD-10-CM

## 2018-12-19 DIAGNOSIS — M25469 Effusion, unspecified knee: Secondary | ICD-10-CM | POA: Diagnosis not present

## 2018-12-19 DIAGNOSIS — M109 Gout, unspecified: Secondary | ICD-10-CM | POA: Diagnosis present

## 2018-12-19 DIAGNOSIS — I951 Orthostatic hypotension: Secondary | ICD-10-CM | POA: Diagnosis not present

## 2018-12-19 DIAGNOSIS — Z7902 Long term (current) use of antithrombotics/antiplatelets: Secondary | ICD-10-CM

## 2018-12-19 DIAGNOSIS — E119 Type 2 diabetes mellitus without complications: Secondary | ICD-10-CM | POA: Diagnosis not present

## 2018-12-19 DIAGNOSIS — Z794 Long term (current) use of insulin: Secondary | ICD-10-CM | POA: Diagnosis not present

## 2018-12-19 DIAGNOSIS — T8189XS Other complications of procedures, not elsewhere classified, sequela: Secondary | ICD-10-CM | POA: Diagnosis not present

## 2018-12-19 DIAGNOSIS — D649 Anemia, unspecified: Secondary | ICD-10-CM

## 2018-12-19 DIAGNOSIS — M25562 Pain in left knee: Secondary | ICD-10-CM | POA: Diagnosis not present

## 2018-12-19 DIAGNOSIS — R7309 Other abnormal glucose: Secondary | ICD-10-CM | POA: Diagnosis not present

## 2018-12-19 DIAGNOSIS — I998 Other disorder of circulatory system: Secondary | ICD-10-CM | POA: Diagnosis not present

## 2018-12-19 DIAGNOSIS — I129 Hypertensive chronic kidney disease with stage 1 through stage 4 chronic kidney disease, or unspecified chronic kidney disease: Secondary | ICD-10-CM | POA: Diagnosis present

## 2018-12-19 DIAGNOSIS — D473 Essential (hemorrhagic) thrombocythemia: Secondary | ICD-10-CM | POA: Diagnosis not present

## 2018-12-19 DIAGNOSIS — E1165 Type 2 diabetes mellitus with hyperglycemia: Secondary | ICD-10-CM | POA: Diagnosis not present

## 2018-12-19 DIAGNOSIS — M1711 Unilateral primary osteoarthritis, right knee: Secondary | ICD-10-CM | POA: Diagnosis not present

## 2018-12-19 HISTORY — PX: I & D EXTREMITY: SHX5045

## 2018-12-19 LAB — CBC WITH DIFFERENTIAL/PLATELET
Abs Immature Granulocytes: 0.16 10*3/uL — ABNORMAL HIGH (ref 0.00–0.07)
Basophils Absolute: 0 10*3/uL (ref 0.0–0.1)
Basophils Relative: 0 %
Eosinophils Absolute: 0 10*3/uL (ref 0.0–0.5)
Eosinophils Relative: 0 %
HCT: 21.8 % — ABNORMAL LOW (ref 39.0–52.0)
Hemoglobin: 6.7 g/dL — CL (ref 13.0–17.0)
Immature Granulocytes: 2 %
Lymphocytes Relative: 15 %
Lymphs Abs: 1.6 10*3/uL (ref 0.7–4.0)
MCH: 22.6 pg — ABNORMAL LOW (ref 26.0–34.0)
MCHC: 30.7 g/dL (ref 30.0–36.0)
MCV: 73.6 fL — ABNORMAL LOW (ref 80.0–100.0)
Monocytes Absolute: 1 10*3/uL (ref 0.1–1.0)
Monocytes Relative: 10 %
Neutro Abs: 7.8 10*3/uL — ABNORMAL HIGH (ref 1.7–7.7)
Neutrophils Relative %: 73 %
Platelets: 555 10*3/uL — ABNORMAL HIGH (ref 150–400)
RBC: 2.96 MIL/uL — ABNORMAL LOW (ref 4.22–5.81)
RDW: 19.6 % — ABNORMAL HIGH (ref 11.5–15.5)
WBC: 10.6 10*3/uL — ABNORMAL HIGH (ref 4.0–10.5)
nRBC: 0.2 % (ref 0.0–0.2)

## 2018-12-19 LAB — GLUCOSE, CAPILLARY
Glucose-Capillary: 143 mg/dL — ABNORMAL HIGH (ref 70–99)
Glucose-Capillary: 163 mg/dL — ABNORMAL HIGH (ref 70–99)
Glucose-Capillary: 148 mg/dL — ABNORMAL HIGH (ref 70–99)
Glucose-Capillary: 141 mg/dL — ABNORMAL HIGH (ref 70–99)
Glucose-Capillary: 276 mg/dL — ABNORMAL HIGH (ref 70–99)

## 2018-12-19 LAB — RETICULOCYTES
Immature Retic Fract: 34.8 % — ABNORMAL HIGH (ref 2.3–15.9)
RBC.: 2.69 MIL/uL — ABNORMAL LOW (ref 4.22–5.81)
Retic Count, Absolute: 92 10*3/uL (ref 19.0–186.0)
Retic Ct Pct: 3.4 % — ABNORMAL HIGH (ref 0.4–3.1)

## 2018-12-19 LAB — URINALYSIS, ROUTINE W REFLEX MICROSCOPIC
Bilirubin Urine: NEGATIVE
Glucose, UA: NEGATIVE mg/dL
Hgb urine dipstick: NEGATIVE
Ketones, ur: NEGATIVE mg/dL
Leukocytes,Ua: NEGATIVE
Nitrite: NEGATIVE
Protein, ur: NEGATIVE mg/dL
Specific Gravity, Urine: 1.011 (ref 1.005–1.030)
pH: 5 (ref 5.0–8.0)

## 2018-12-19 LAB — COMPREHENSIVE METABOLIC PANEL
ALT: 19 U/L (ref 0–44)
AST: 24 U/L (ref 15–41)
Albumin: 1.8 g/dL — ABNORMAL LOW (ref 3.5–5.0)
Alkaline Phosphatase: 94 U/L (ref 38–126)
Anion gap: 13 (ref 5–15)
BUN: 50 mg/dL — ABNORMAL HIGH (ref 8–23)
CO2: 20 mmol/L — ABNORMAL LOW (ref 22–32)
Calcium: 8.9 mg/dL (ref 8.9–10.3)
Chloride: 95 mmol/L — ABNORMAL LOW (ref 98–111)
Creatinine, Ser: 2.2 mg/dL — ABNORMAL HIGH (ref 0.61–1.24)
GFR calc Af Amer: 35 mL/min — ABNORMAL LOW (ref >60)
GFR calc non Af Amer: 30 mL/min — ABNORMAL LOW (ref >60)
Glucose, Bld: 244 mg/dL — ABNORMAL HIGH (ref 70–99)
Potassium: 5 mmol/L (ref 3.5–5.1)
Sodium: 128 mmol/L — ABNORMAL LOW (ref 135–145)
Total Bilirubin: 0.8 mg/dL (ref 0.3–1.2)
Total Protein: 7.3 g/dL (ref 6.5–8.1)

## 2018-12-19 LAB — PREPARE RBC (CROSSMATCH)

## 2018-12-19 LAB — FOLATE: Folate: 22.6 ng/mL (ref >5.9)

## 2018-12-19 LAB — LACTIC ACID, PLASMA
Lactic Acid, Venous: 3.2 mmol/L (ref 0.5–1.9)
Lactic Acid, Venous: 2.4 mmol/L (ref 0.5–1.9)

## 2018-12-19 LAB — IRON AND TIBC
Iron: 38 ug/dL — ABNORMAL LOW (ref 45–182)
Saturation Ratios: 19 % (ref 17.9–39.5)
TIBC: 197 ug/dL — ABNORMAL LOW (ref 250–450)
UIBC: 159 ug/dL

## 2018-12-19 LAB — SARS CORONAVIRUS 2 BY RT PCR (HOSPITAL ORDER, PERFORMED IN ~~LOC~~ HOSPITAL LAB): SARS Coronavirus 2: NEGATIVE

## 2018-12-19 LAB — FERRITIN: Ferritin: 367 ng/mL — ABNORMAL HIGH (ref 24–336)

## 2018-12-19 LAB — VITAMIN B12: Vitamin B-12: 3342 pg/mL — ABNORMAL HIGH (ref 180–914)

## 2018-12-19 SURGERY — IRRIGATION AND DEBRIDEMENT EXTREMITY
Anesthesia: General | Laterality: Left

## 2018-12-19 MED ORDER — ONDANSETRON HCL 4 MG/2ML IJ SOLN
INTRAMUSCULAR | Status: DC | PRN
  Administered 2018-12-19: 4 mg via INTRAVENOUS

## 2018-12-19 MED ORDER — ONDANSETRON HCL 4 MG/2ML IJ SOLN
INTRAMUSCULAR | Status: AC
  Filled 2018-12-19: qty 2

## 2018-12-19 MED ORDER — MIDAZOLAM HCL 2 MG/2ML IJ SOLN: 0.5000 mg | Freq: Once | INTRAMUSCULAR | Status: DC | PRN

## 2018-12-19 MED ORDER — CARVEDILOL 12.5 MG PO TABS
12.5000 mg | ORAL_TABLET | Freq: Every day | ORAL | Status: DC
  Administered 2018-12-20 – 2018-12-21 (×2): 12.5 mg via ORAL
  Filled 2018-12-19 (×2): qty 1

## 2018-12-19 MED ORDER — SODIUM CHLORIDE 0.9 % IV SOLN
INTRAVENOUS | Status: DC | PRN
  Administered 2018-12-19: 15:00:00 via INTRAVENOUS

## 2018-12-19 MED ORDER — METRONIDAZOLE IN NACL 5-0.79 MG/ML-% IV SOLN
500.0000 mg | Freq: Three times a day (TID) | INTRAVENOUS | Status: DC
  Administered 2018-12-19 – 2018-12-20 (×5): 500 mg via INTRAVENOUS
  Filled 2018-12-19 (×5): qty 100

## 2018-12-19 MED ORDER — VANCOMYCIN HCL IN DEXTROSE 1-5 GM/200ML-% IV SOLN
1000.0000 mg | INTRAVENOUS | Status: DC
  Administered 2018-12-20: 1000 mg via INTRAVENOUS
  Filled 2018-12-19: qty 200

## 2018-12-19 MED ORDER — FENTANYL CITRATE (PF) 100 MCG/2ML IJ SOLN
INTRAMUSCULAR | Status: DC | PRN
  Administered 2018-12-19: 50 ug via INTRAVENOUS

## 2018-12-19 MED ORDER — LACTATED RINGERS IV BOLUS (SEPSIS)
1000.0000 mL | Freq: Once | INTRAVENOUS | Status: AC
  Administered 2018-12-19: 1000 mL via INTRAVENOUS

## 2018-12-19 MED ORDER — MEPERIDINE HCL 25 MG/ML IJ SOLN: 6.2500 mg | INTRAMUSCULAR | Status: DC | PRN

## 2018-12-19 MED ORDER — FENTANYL CITRATE (PF) 250 MCG/5ML IJ SOLN
INTRAMUSCULAR | Status: AC
  Filled 2018-12-19: qty 5

## 2018-12-19 MED ORDER — SODIUM CHLORIDE 0.9 % IV SOLN
2.0000 g | Freq: Once | INTRAVENOUS | Status: AC
  Administered 2018-12-19: 2 g via INTRAVENOUS
  Filled 2018-12-19: qty 20

## 2018-12-19 MED ORDER — SODIUM CHLORIDE 0.9 % IV SOLN
INTRAVENOUS | Status: DC | PRN
  Administered 2018-12-19: 25 ug/min via INTRAVENOUS

## 2018-12-19 MED ORDER — FENTANYL CITRATE (PF) 100 MCG/2ML IJ SOLN
INTRAMUSCULAR | Status: AC
  Filled 2018-12-19: qty 2

## 2018-12-19 MED ORDER — SUGAMMADEX SODIUM 200 MG/2ML IV SOLN
INTRAVENOUS | Status: DC | PRN
  Administered 2018-12-19: 200 mg via INTRAVENOUS

## 2018-12-19 MED ORDER — ROCURONIUM BROMIDE 10 MG/ML (PF) SYRINGE
PREFILLED_SYRINGE | INTRAVENOUS | Status: AC
  Filled 2018-12-19: qty 10

## 2018-12-19 MED ORDER — PROPOFOL 10 MG/ML IV BOLUS
INTRAVENOUS | Status: DC | PRN
  Administered 2018-12-19: 80 mg via INTRAVENOUS

## 2018-12-19 MED ORDER — LIDOCAINE 2% (20 MG/ML) 5 ML SYRINGE
INTRAMUSCULAR | Status: AC
  Filled 2018-12-19: qty 10

## 2018-12-19 MED ORDER — SUCCINYLCHOLINE CHLORIDE 200 MG/10ML IV SOSY
PREFILLED_SYRINGE | INTRAVENOUS | Status: AC
  Filled 2018-12-19: qty 10

## 2018-12-19 MED ORDER — INSULIN REGULAR(HUMAN) IN NACL 100-0.9 UT/100ML-% IV SOLN
INTRAVENOUS | Status: DC
  Administered 2018-12-19: .8 [IU]/h via INTRAVENOUS
  Filled 2018-12-19: qty 100

## 2018-12-19 MED ORDER — FENTANYL CITRATE (PF) 100 MCG/2ML IJ SOLN
25.0000 ug | INTRAMUSCULAR | Status: DC | PRN
  Administered 2018-12-19: 50 ug via INTRAVENOUS

## 2018-12-19 MED ORDER — INSULIN PUMP: Freq: Three times a day (TID) | SUBCUTANEOUS | Status: DC

## 2018-12-19 MED ORDER — SODIUM CHLORIDE 0.9 % IV SOLN: 10.0000 mL/h | Freq: Once | INTRAVENOUS | Status: DC

## 2018-12-19 MED ORDER — MIDAZOLAM HCL 2 MG/2ML IJ SOLN
INTRAMUSCULAR | Status: AC
  Filled 2018-12-19: qty 2

## 2018-12-19 MED ORDER — DEXAMETHASONE SODIUM PHOSPHATE 10 MG/ML IJ SOLN
INTRAMUSCULAR | Status: AC
  Filled 2018-12-19: qty 1

## 2018-12-19 MED ORDER — LIDOCAINE HCL (CARDIAC) PF 100 MG/5ML IV SOSY
PREFILLED_SYRINGE | INTRAVENOUS | Status: DC | PRN
  Administered 2018-12-19: 80 mg via INTRAVENOUS

## 2018-12-19 MED ORDER — PREGABALIN 50 MG PO CAPS
50.0000 mg | ORAL_CAPSULE | Freq: Every day | ORAL | Status: DC
  Administered 2018-12-19 – 2018-12-25 (×7): 50 mg via ORAL
  Filled 2018-12-19 (×8): qty 1

## 2018-12-19 MED ORDER — DEXAMETHASONE SODIUM PHOSPHATE 10 MG/ML IJ SOLN
INTRAMUSCULAR | Status: DC | PRN
  Administered 2018-12-19: 5 mg via INTRAVENOUS

## 2018-12-19 MED ORDER — OXYCODONE-ACETAMINOPHEN 5-325 MG PO TABS
1.0000 | ORAL_TABLET | ORAL | Status: DC | PRN
  Administered 2018-12-19 – 2018-12-26 (×18): 1 via ORAL
  Filled 2018-12-19 (×19): qty 1

## 2018-12-19 MED ORDER — MIDAZOLAM HCL 5 MG/5ML IJ SOLN
INTRAMUSCULAR | Status: DC | PRN
  Administered 2018-12-19: 1 mg via INTRAVENOUS

## 2018-12-19 MED ORDER — PRAVASTATIN SODIUM 40 MG PO TABS
40.0000 mg | ORAL_TABLET | Freq: Every day | ORAL | Status: DC
  Administered 2018-12-19 – 2018-12-25 (×7): 40 mg via ORAL
  Filled 2018-12-19 (×7): qty 1

## 2018-12-19 MED ORDER — VANCOMYCIN HCL IN DEXTROSE 1-5 GM/200ML-% IV SOLN: 1000.0000 mg | Freq: Once | INTRAVENOUS | Status: DC

## 2018-12-19 MED ORDER — PROMETHAZINE HCL 25 MG/ML IJ SOLN: 6.2500 mg | INTRAMUSCULAR | Status: DC | PRN

## 2018-12-19 MED ORDER — LATANOPROST 0.005 % OP SOLN
1.0000 [drp] | Freq: Every day | OPHTHALMIC | Status: DC
  Administered 2018-12-19 – 2018-12-25 (×7): 1 [drp] via OPHTHALMIC
  Filled 2018-12-19: qty 2.5

## 2018-12-19 MED ORDER — DORZOLAMIDE HCL-TIMOLOL MAL 2-0.5 % OP SOLN
1.0000 [drp] | Freq: Two times a day (BID) | OPHTHALMIC | Status: DC
  Administered 2018-12-19 – 2018-12-26 (×14): 1 [drp] via OPHTHALMIC
  Filled 2018-12-19: qty 10

## 2018-12-19 MED ORDER — ROCURONIUM BROMIDE 100 MG/10ML IV SOLN
INTRAVENOUS | Status: DC | PRN
  Administered 2018-12-19: 50 mg via INTRAVENOUS

## 2018-12-19 MED ORDER — INSULIN GLARGINE 100 UNIT/ML ~~LOC~~ SOLN
20.0000 [IU] | Freq: Every day | SUBCUTANEOUS | Status: DC
  Administered 2018-12-19: 20 [IU] via SUBCUTANEOUS
  Filled 2018-12-19 (×2): qty 0.2

## 2018-12-19 MED ORDER — SODIUM CHLORIDE 0.9 % IV SOLN
2.0000 g | Freq: Two times a day (BID) | INTRAVENOUS | Status: DC
  Administered 2018-12-19 – 2018-12-21 (×4): 2 g via INTRAVENOUS
  Filled 2018-12-19 (×5): qty 2

## 2018-12-19 MED ORDER — INSULIN ASPART 100 UNIT/ML ~~LOC~~ SOLN
0.0000 [IU] | Freq: Three times a day (TID) | SUBCUTANEOUS | Status: DC
  Administered 2018-12-19: 1 [IU] via SUBCUTANEOUS
  Administered 2018-12-20: 9 [IU] via SUBCUTANEOUS

## 2018-12-19 MED ORDER — LACTATED RINGERS IV SOLN
INTRAVENOUS | Status: DC | PRN
  Administered 2018-12-19 (×2): via INTRAVENOUS

## 2018-12-19 MED ORDER — VANCOMYCIN HCL 10 G IV SOLR
2000.0000 mg | Freq: Once | INTRAVENOUS | Status: AC
  Administered 2018-12-19: 2000 mg via INTRAVENOUS
  Filled 2018-12-19: qty 2000

## 2018-12-19 MED ORDER — SODIUM CHLORIDE 0.9 % IV SOLN
INTRAVENOUS | Status: DC
  Administered 2018-12-20 – 2018-12-21 (×2): via INTRAVENOUS

## 2018-12-19 MED ORDER — SUCCINYLCHOLINE CHLORIDE 200 MG/10ML IV SOSY
PREFILLED_SYRINGE | INTRAVENOUS | Status: DC | PRN
  Administered 2018-12-19: 80 mg via INTRAVENOUS

## 2018-12-19 MED ORDER — LACTATED RINGERS IV BOLUS (SEPSIS)
500.0000 mL | Freq: Once | INTRAVENOUS | Status: AC
  Administered 2018-12-19: 500 mL via INTRAVENOUS

## 2018-12-19 MED ORDER — 0.9 % SODIUM CHLORIDE (POUR BTL) OPTIME
TOPICAL | Status: DC | PRN
  Administered 2018-12-19 (×2): 1000 mL

## 2018-12-19 SURGICAL SUPPLY — 37 items
BANDAGE ELASTIC 6 VELCRO ST LF (GAUZE/BANDAGES/DRESSINGS) ×1 IMPLANT
BNDG GAUZE ELAST 4 BULKY (GAUZE/BANDAGES/DRESSINGS) ×2 IMPLANT
CANISTER SUCT 3000ML PPV (MISCELLANEOUS) ×2 IMPLANT
CLIP LIGATING EXTRA MED SLVR (CLIP) ×1 IMPLANT
CLIP LIGATING EXTRA SM BLUE (MISCELLANEOUS) ×1 IMPLANT
COVER SURGICAL LIGHT HANDLE (MISCELLANEOUS) ×2 IMPLANT
COVER WAND RF STERILE (DRAPES) ×1 IMPLANT
DRAPE U-SHAPE 47X51 STRL (DRAPES) IMPLANT
DRAPE U-SHAPE 76X120 STRL (DRAPES) IMPLANT
DRSG PAD ABDOMINAL 8X10 ST (GAUZE/BANDAGES/DRESSINGS) ×1 IMPLANT
ELECT REM PT RETURN 9FT ADLT (ELECTROSURGICAL) ×2
ELECTRODE REM PT RTRN 9FT ADLT (ELECTROSURGICAL) ×1 IMPLANT
GAUZE SPONGE 4X4 12PLY STRL (GAUZE/BANDAGES/DRESSINGS) IMPLANT
GAUZE SPONGE 4X4 12PLY STRL LF (GAUZE/BANDAGES/DRESSINGS) ×1 IMPLANT
GLOVE BIO SURGEON STRL SZ 6.5 (GLOVE) ×2 IMPLANT
GLOVE BIOGEL PI IND STRL 6.5 (GLOVE) IMPLANT
GLOVE BIOGEL PI INDICATOR 6.5 (GLOVE) ×3
GLOVE SS BIOGEL STRL SZ 7.5 (GLOVE) ×1 IMPLANT
GLOVE SUPERSENSE BIOGEL SZ 7.5 (GLOVE) ×1
GLOVE SURG SS PI 6.5 STRL IVOR (GLOVE) ×2 IMPLANT
GOWN STRL REUS W/ TWL LRG LVL3 (GOWN DISPOSABLE) ×3 IMPLANT
GOWN STRL REUS W/TWL LRG LVL3 (GOWN DISPOSABLE) ×4
KIT BASIN OR (CUSTOM PROCEDURE TRAY) ×2 IMPLANT
KIT TURNOVER KIT B (KITS) ×2 IMPLANT
NS IRRIG 1000ML POUR BTL (IV SOLUTION) ×3 IMPLANT
PACK GENERAL/GYN (CUSTOM PROCEDURE TRAY) ×1 IMPLANT
PACK PERIPHERAL VASCULAR (CUSTOM PROCEDURE TRAY) ×1 IMPLANT
PACK UNIVERSAL I (CUSTOM PROCEDURE TRAY) IMPLANT
PAD ABD 8X10 STRL (GAUZE/BANDAGES/DRESSINGS) ×1 IMPLANT
PAD ARMBOARD 7.5X6 YLW CONV (MISCELLANEOUS) ×3 IMPLANT
SUT ETHILON 3 0 PS 1 (SUTURE) IMPLANT
SUT PROLENE 5 0 C 1 24 (SUTURE) ×4 IMPLANT
SUT VIC AB 2-0 CTX 36 (SUTURE) IMPLANT
SUT VIC AB 3-0 SH 27 (SUTURE)
SUT VIC AB 3-0 SH 27X BRD (SUTURE) IMPLANT
TOWEL GREEN STERILE (TOWEL DISPOSABLE) ×2 IMPLANT
WATER STERILE IRR 1000ML POUR (IV SOLUTION) ×2 IMPLANT

## 2018-12-19 NOTE — Op Note (Signed)
    OPERATIVE REPORT  DATE OF SURGERY: 12/19/2018  PATIENT: [name], 68 y.o. male MRN: [id number]  DOB: 23-Aug-1950  PRE-OPERATIVE DIAGNOSIS: Infection and abscess left lower extremity  POST-OPERATIVE DIAGNOSIS:  Same  PROCEDURE: Incision and drainage of abscess in the medial proximal calf and popliteal space and a separate area in the distal calf  SURGEON:  Curt Jews, M.D.  PHYSICIAN ASSISTANT: Liana Crocker, PA-C  ANESTHESIA: General  EBL: per anesthesia record  Total I/O In: 0092 [I.V.:1500; Blood:945; IV Piggyback:2600] Out: 200 [Blood:200]  BLOOD ADMINISTERED: none  DRAINS: none  SPECIMEN: Aerobic and anaerobic cultures  COUNTS CORRECT:  YES  PATIENT DISPOSITION:  PACU - hemodynamically stable  PROCEDURE DETAILS: Patient was taken the operating placed supine position where the area of the left groin left leg were prepped and draped you sterile fashion.  Patient had brown pus draining out of area in his upper medial calf.  Also had an area of granulation in the distal calf.  Patient did have a palpable dorsalis pedis pulse with a patent SFA to anterior tibial graft.  The incision that was draining in the upper area was opened and blade.  There was a large amount of old necrotic blood and some tissue in the base of the area.  This did track behind the neurovascular bundle between the level of the gastrocnemius and soleus muscle.  The wound was irrigated.  Was opened to give adequate proximal and distal debridement.  Next the area that had granulated on the more distal medial calf was addressed.  The patient did have an abscess under the tissue in this area and was entered bluntly.  This did track proximal and distal for several centimeters as well.  The the subcutaneous tissue at all granulated and was opened.  Patient is on Xarelto and did have a fair amount of oozing from the wounds.  The vein graft could easily be palpated at the base of the proximal incision.  The  wounds were irrigated with saline and 1 sure that no further open on drained areas of purulence were encountered the wounds were packed.  This was with Betadine soaked Kerlix.  Sterile dressing and ABD gauze was placed over this.  The leg was wrapped with an Ace wrap.  Was transferred to the recovery room in stable condition   Rosetta Posner, M.D., [facility] 12/19/2018 3:30 PM
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
## 2018-12-19 NOTE — Progress Notes (Signed)
Pharmacy Antibiotic Note  [name] is a 68 y.o. male admitted on 12/19/2018 with sepsis.  Source likely LLE cellulitis.  Went to vascular today and was sent here for admission and sepsis work-up. The patient is now s/p I&D and the MD has requested to broaden from Rocephin to Cefepime while awaiting culture results - continue with Vancomycin.    A Vancomycin loading dose of 2g was given at 1200 earlier today - will continue with q24h dosing as ordered.   Plan: - Continue Vancomycin 1g IV every 24 hours - Start Cefepime 2g IV every 12 hours - Will continue to follow renal function, culture results, LOT, and antibiotic de-escalation plans    Height: 6\' 1"  (185.4 cm) Weight: 238 lb 1.6 oz (108 kg) IBW/kg (Calculated) : 79.9  Temp (24hrs), Avg:97.7 F (36.5 C), Min:97 F (36.1 C), Max:97.9 F (36.6 C)  Recent Labs  Lab 12/19/18 1031 12/19/18 1036  WBC 10.6*  --   CREATININE 2.20*  --   LATICACIDVEN  --  3.2*    Estimated Creatinine Clearance: 42 mL/min (A) (by C-G formula based on SCr of 2.2 mg/dL (H)).    No Known Allergies  Antimicrobials this admission: Vancomycin 6/26 >> CTX 6/26 x 1 Cefepime 6/26 >>  6/26 COVID >> neg 6/26 BCx >> 6/26 WCx (intra-op) >>  Thank you for allowing pharmacy to be a part of this patient's care.  Alycia Rossetti, PharmD, BCPS Clinical Pharmacist 12/19/2018 3:38 PM   **Pharmacist phone directory can now be found on [email or web address] ([email or web address]).  Listed under [place].

## 2018-12-19 NOTE — ED Notes (Signed)
Pt receiving blood, no lab draws per protocol.

## 2018-12-19 NOTE — Progress Notes (Signed)
Pt transferred to 4E-16 via bed from PACU. Pt given CHG bath. Tele applied, CCMD notified. Pt oriented to call bell, room and bed. Call bell within reach. Pt on glucostabilizer upon arrival, orders for home insulin pump- pt does not have with him paged attending. VSS. Will continue to monitor.  Amanda Cockayne, RN

## 2018-12-19 NOTE — ED Triage Notes (Signed)
Pt sent from PCP for evaluation for possible leg infection and low hemoglobin level.  Pt was seen by PCP on Wednesday, they called him today due to hemoglobin lab value.  Pt had amputation in January of toes on left foot.

## 2018-12-19 NOTE — Progress Notes (Signed)
Pharmacy Antibiotic Note  [name] is a 68 y.o. male admitted on 12/19/2018 with sepsis.  Source likely LLE cellulitis.  Went to vascular today and was sent here for admission and sepsis work-up and likely will have operation later today.  Pharmacy has been consulted for vancomycin dosing. Likely AKI with Scr 2.2 (baseline near 1.2-1.5?) Estimated CrCl currently 48mL/min.  Plan: CTX per MD Vancomycin 2000mg  x1 Vancomycin 1000mg  q24hrs Estimated AUC 530 Monitor renal function, culture data, LOT, vanc levels as indicated   Height: 6\' 1"  (185.4 cm) Weight: 238 lb 1.6 oz (108 kg) IBW/kg (Calculated) : 79.9  Temp (24hrs), Avg:97.9 F (36.6 C), Min:97.8 F (36.6 C), Max:97.9 F (36.6 C)  No results for input(s): WBC, CREATININE, LATICACIDVEN, VANCOTROUGH, VANCOPEAK, VANCORANDOM, GENTTROUGH, GENTPEAK, GENTRANDOM, TOBRATROUGH, TOBRAPEAK, TOBRARND, AMIKACINPEAK, AMIKACINTROU, AMIKACIN in the last 168 hours.  CrCl cannot be calculated (Patient's most recent lab result is older than the maximum 21 days allowed.).    No Known Allergies  Antimicrobials this admission: Vancomycin 6/26 >> CTX 6/26 >>  Thank you for involving pharmacy in this patient's care.  Janae Bridgeman, PharmD PGY1 Pharmacy Resident Phone: [phone] 12/19/2018 11:01 AM

## 2018-12-19 NOTE — Transfer of Care (Signed)
Immediate Anesthesia Transfer of Care Note  Patient: [name]  Procedure(s) Performed: IRRIGATION AND DEBRIDEMENT OF LEFT LOWER LEG WOUND (Left )  Patient Location: PACU  Anesthesia Type:General  Level of Consciousness: awake, alert  and oriented  Airway & Oxygen Therapy: Patient Spontanous Breathing and Patient connected to nasal cannula oxygen  Post-op Assessment: Report given to RN and Post -op Vital signs reviewed and stable  Post vital signs: Reviewed and stable  Last Vitals:  Vitals Value Taken Time  BP 117/99 12/19/18 1523  Temp    Pulse 71 12/19/18 1524  Resp 25 12/19/18 1525  SpO2 100 % 12/19/18 1524  Vitals shown include unvalidated device data.  Last Pain:  Vitals:   12/19/18 1329  TempSrc:   PainSc: 6          Complications: No apparent anesthesia complications
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
## 2018-12-19 NOTE — Anesthesia Procedure Notes (Signed)
Procedure Name: Intubation Date/Time: 12/19/2018 2:08 PM Performed by: Candis Shine, CRNA Pre-anesthesia Checklist: Patient identified, Emergency Drugs available, Suction available and Patient being monitored Patient Re-evaluated:Patient Re-evaluated prior to induction Oxygen Delivery Method: Circle System Utilized Preoxygenation: Pre-oxygenation with 100% oxygen Induction Type: IV induction, Rapid sequence and Cricoid Pressure applied Laryngoscope Size: Mac and 4 Grade View: Grade II Tube type: Oral Tube size: 7.5 mm Number of attempts: 1 Airway Equipment and Method: Stylet Placement Confirmation: ETT inserted through vocal cords under direct vision,  positive ETCO2 and breath sounds checked- equal and bilateral Secured at: 23 cm Tube secured with: Tape Dental Injury: Teeth and Oropharynx as per pre-operative assessment

## 2018-12-19 NOTE — Interval H&P Note (Signed)
History and Physical Interval Note:  12/19/2018 1:00 PM  [name]  has presented today for surgery, with the diagnosis of Infected left thigh wound.  The various methods of treatment have been discussed with the patient and family. After consideration of risks, benefits and other options for treatment, the patient has consented to  Procedure(s): IRRIGATION AND DEBRIDEMENT THIGH WOUND (N/A) as a surgical intervention.  The patient's history has been reviewed, patient examined, no change in status, stable for surgery.  I have reviewed the patient's chart and labs.  Questions were answered to the patient's satisfaction.     Curt Jews
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
## 2018-12-19 NOTE — Anesthesia Postprocedure Evaluation (Signed)
Anesthesia Post Note  Patient: [name]  Procedure(s) Performed: IRRIGATION AND DEBRIDEMENT OF LEFT LOWER LEG WOUND (Left )     Patient location during evaluation: PACU Anesthesia Type: General Level of consciousness: awake and alert, patient cooperative and oriented Pain management: pain level controlled Vital Signs Assessment: post-procedure vital signs reviewed and stable Respiratory status: spontaneous breathing, nonlabored ventilation, respiratory function stable and patient connected to nasal cannula oxygen Cardiovascular status: blood pressure returned to baseline and stable Postop Assessment: no apparent nausea or vomiting Anesthetic complications: no    Last Vitals:  Vitals:   12/19/18 1550 12/19/18 1608  BP: 122/78 107/72  Pulse: 69 69  Resp: 18   Temp:  36.4 C  SpO2: 96% 100%    Last Pain:  Vitals:   12/19/18 1608  TempSrc: Oral  PainSc:                  Pleas Carneal,E. Bosco Paparella

## 2018-12-19 NOTE — Progress Notes (Signed)
[place]link RN, Adella Hare, following up with this sepsis protocol.  Pt had elevated LA of 3.2 at 1036.  No repeat LA drawn dt pt receiving blood, noted by ER RN at 1253.

## 2018-12-19 NOTE — Anesthesia Preprocedure Evaluation (Addendum)
Anesthesia Evaluation  Patient identified by MRN, date of birth, ID band Patient awake    Reviewed: Allergy & Precautions, NPO status , Patient's Chart, lab work & pertinent test results, reviewed documented beta blocker date and time Preop documentation limited or incomplete due to emergent nature of procedure.  History of Anesthesia Complications Negative for: history of anesthetic complications  Airway Mallampati: II  TM Distance: >3 FB Neck ROM: Full    Dental  (+) Missing, Dental Advisory Given   Pulmonary former smoker (quit 1999),  12/19/2018 SARS coronavirus NEG   breath sounds clear to auscultation       Cardiovascular hypertension, Pt. on medications and Pt. on home beta blockers (-) angina Rhythm:Regular Rate:Normal  '14 ECHO: EF 60-65%, valves OK   Neuro/Psych negative neurological ROS     GI/Hepatic negative GI ROS, Neg liver ROS,   Endo/Other  diabetes (glu 163), Insulin Dependent, Oral Hypoglycemic Agents  Renal/GU Renal InsufficiencyRenal disease     Musculoskeletal   Abdominal   Peds  Hematology  (+) Blood dyscrasia, anemia , xarelto Hb 6.7, transfusion in progress   Anesthesia Other Findings   Reproductive/Obstetrics                            Anesthesia Physical Anesthesia Plan  ASA: III and emergent  Anesthesia Plan: General   Post-op Pain Management:    Induction: Intravenous and Rapid sequence  PONV Risk Score and Plan: 2 and Ondansetron and Dexamethasone  Airway Management Planned: Oral ETT  Additional Equipment:   Intra-op Plan:   Post-operative Plan: Extubation in OR  Informed Consent: I have reviewed the patients History and Physical, chart, labs and discussed the procedure including the risks, benefits and alternatives for the proposed anesthesia with the patient or authorized representative who has indicated his/her understanding and acceptance.      Dental advisory given  Plan Discussed with: CRNA and Surgeon  Anesthesia Plan Comments:         Anesthesia Quick Evaluation

## 2018-12-20 ENCOUNTER — Encounter (HOSPITAL_COMMUNITY): Payer: Self-pay | Admitting: Vascular Surgery

## 2018-12-20 DIAGNOSIS — E119 Type 2 diabetes mellitus without complications: Secondary | ICD-10-CM

## 2018-12-20 DIAGNOSIS — N183 Chronic kidney disease, stage 3 (moderate): Secondary | ICD-10-CM

## 2018-12-20 DIAGNOSIS — D62 Acute posthemorrhagic anemia: Secondary | ICD-10-CM

## 2018-12-20 DIAGNOSIS — Z794 Long term (current) use of insulin: Secondary | ICD-10-CM

## 2018-12-20 LAB — BPAM RBC
Blood Product Expiration Date: 202007202359
Blood Product Expiration Date: 202007212359
Blood Product Expiration Date: 202007212359
ISSUE DATE / TIME: 202006261157
ISSUE DATE / TIME: 202006261304
ISSUE DATE / TIME: 202006261304
Unit Type and Rh: 5100
Unit Type and Rh: 5100
Unit Type and Rh: 5100

## 2018-12-20 LAB — TYPE AND SCREEN
ABO/RH(D): O POS
Antibody Screen: NEGATIVE
Unit division: 0
Unit division: 0
Unit division: 0

## 2018-12-20 LAB — BASIC METABOLIC PANEL
Anion gap: 11 (ref 5–15)
BUN: 40 mg/dL — ABNORMAL HIGH (ref 8–23)
CO2: 19 mmol/L — ABNORMAL LOW (ref 22–32)
Calcium: 8.1 mg/dL — ABNORMAL LOW (ref 8.9–10.3)
Chloride: 99 mmol/L (ref 98–111)
Creatinine, Ser: 1.55 mg/dL — ABNORMAL HIGH (ref 0.61–1.24)
GFR calc Af Amer: 53 mL/min — ABNORMAL LOW (ref >60)
GFR calc non Af Amer: 46 mL/min — ABNORMAL LOW (ref >60)
Glucose, Bld: 362 mg/dL — ABNORMAL HIGH (ref 70–99)
Potassium: 5.3 mmol/L — ABNORMAL HIGH (ref 3.5–5.1)
Sodium: 129 mmol/L — ABNORMAL LOW (ref 135–145)

## 2018-12-20 LAB — CBC
HCT: 26.3 % — ABNORMAL LOW (ref 39.0–52.0)
Hemoglobin: 8.6 g/dL — ABNORMAL LOW (ref 13.0–17.0)
MCH: 25.5 pg — ABNORMAL LOW (ref 26.0–34.0)
MCHC: 32.7 g/dL (ref 30.0–36.0)
MCV: 78 fL — ABNORMAL LOW (ref 80.0–100.0)
Platelets: 399 10*3/uL (ref 150–400)
RBC: 3.37 MIL/uL — ABNORMAL LOW (ref 4.22–5.81)
RDW: 20.9 % — ABNORMAL HIGH (ref 11.5–15.5)
WBC: 8 10*3/uL (ref 4.0–10.5)
nRBC: 0 % (ref 0.0–0.2)

## 2018-12-20 LAB — APTT: aPTT: 39 seconds — ABNORMAL HIGH (ref 24–36)

## 2018-12-20 LAB — GLUCOSE, CAPILLARY
Glucose-Capillary: 368 mg/dL — ABNORMAL HIGH (ref 70–99)
Glucose-Capillary: 394 mg/dL — ABNORMAL HIGH (ref 70–99)
Glucose-Capillary: 413 mg/dL — ABNORMAL HIGH (ref 70–99)
Glucose-Capillary: 343 mg/dL — ABNORMAL HIGH (ref 70–99)
Glucose-Capillary: 304 mg/dL — ABNORMAL HIGH (ref 70–99)
Glucose-Capillary: 269 mg/dL — ABNORMAL HIGH (ref 70–99)

## 2018-12-20 MED ORDER — INSULIN ASPART 100 UNIT/ML ~~LOC~~ SOLN: 0.0000 [IU] | Freq: Every day | SUBCUTANEOUS | Status: DC

## 2018-12-20 MED ORDER — INSULIN ASPART 100 UNIT/ML ~~LOC~~ SOLN
0.0000 [IU] | Freq: Every day | SUBCUTANEOUS | Status: DC
  Administered 2018-12-20: 3 [IU] via SUBCUTANEOUS
  Administered 2018-12-22 – 2018-12-23 (×2): 4 [IU] via SUBCUTANEOUS
  Administered 2018-12-24: 2 [IU] via SUBCUTANEOUS

## 2018-12-20 MED ORDER — VANCOMYCIN HCL 10 G IV SOLR
1250.0000 mg | INTRAVENOUS | Status: DC
  Filled 2018-12-20: qty 1250

## 2018-12-20 MED ORDER — INSULIN GLARGINE 100 UNIT/ML ~~LOC~~ SOLN
26.0000 [IU] | Freq: Every day | SUBCUTANEOUS | Status: DC
  Administered 2018-12-20 – 2018-12-23 (×4): 26 [IU] via SUBCUTANEOUS
  Filled 2018-12-20 (×6): qty 0.26

## 2018-12-20 MED ORDER — INSULIN ASPART 100 UNIT/ML ~~LOC~~ SOLN
0.0000 [IU] | Freq: Three times a day (TID) | SUBCUTANEOUS | Status: DC
  Administered 2018-12-20: 15 [IU] via SUBCUTANEOUS
  Administered 2018-12-20: 11 [IU] via SUBCUTANEOUS
  Administered 2018-12-21: 15 [IU] via SUBCUTANEOUS
  Administered 2018-12-21: 8 [IU] via SUBCUTANEOUS
  Administered 2018-12-21: 5 [IU] via SUBCUTANEOUS
  Administered 2018-12-22: 3 [IU] via SUBCUTANEOUS
  Administered 2018-12-22: 5 [IU] via SUBCUTANEOUS
  Administered 2018-12-22 – 2018-12-23 (×2): 8 [IU] via SUBCUTANEOUS
  Administered 2018-12-23: 3 [IU] via SUBCUTANEOUS
  Administered 2018-12-24: 8 [IU] via SUBCUTANEOUS
  Administered 2018-12-24: 11 [IU] via SUBCUTANEOUS
  Administered 2018-12-24 – 2018-12-25 (×2): 5 [IU] via SUBCUTANEOUS
  Administered 2018-12-25: 8 [IU] via SUBCUTANEOUS
  Administered 2018-12-25 – 2018-12-26 (×2): 3 [IU] via SUBCUTANEOUS
  Administered 2018-12-26: 2 [IU] via SUBCUTANEOUS

## 2018-12-20 MED ORDER — INSULIN ASPART 100 UNIT/ML ~~LOC~~ SOLN
0.0000 [IU] | Freq: Three times a day (TID) | SUBCUTANEOUS | Status: DC
  Administered 2018-12-20: 20 [IU] via SUBCUTANEOUS

## 2018-12-20 NOTE — Progress Notes (Signed)
Pharmacy Antibiotic Note  [name] is a 68 y.o. male admitted on 12/19/2018 with sepsis.  Source likely LLE cellulitis.  Went to vascular appt and was sent here for admission and sepsis work-up. The patient is now s/p I&D and the MD has requested to broaden from Rocephin to Cefepime while awaiting culture results - continue with Vancomycin.    Renal function improved, SCr down to 1.55 (baseline 1.2-1.5).  Plan: - Increase Vancomycin 1250 mg IV every 24 hours - Cefepime 2g IV every 12 hours - Will continue to follow renal function, culture results, LOT, and antibiotic de-escalation plans    Height: 6\' 1"  (185.4 cm) Weight: 238 lb 1.6 oz (108 kg) IBW/kg (Calculated) : 79.9  Temp (24hrs), Avg:97.6 F (36.4 C), Min:97 F (36.1 C), Max:97.9 F (36.6 C)  Recent Labs  Lab 12/19/18 1031 12/19/18 1036 12/19/18 1610 12/20/18 0421  WBC 10.6*  --   --  8.0  CREATININE 2.20*  --   --  1.55*  LATICACIDVEN  --  3.2* 2.4*  --     Estimated Creatinine Clearance: 59.6 mL/min (A) (by C-G formula based on SCr of 1.55 mg/dL (H)).    No Known Allergies  Antimicrobials this admission: Vancomycin 6/26 >> CTX 6/26 x 1 Cefepime 6/26 >>  6/26 COVID >> neg 6/26 BCx >> 6/26 WCx (intra-op) >> mod GPC   Thank you for involving pharmacy in this patient's care.  Renold Genta, PharmD, BCPS Clinical Pharmacist Clinical phone for 12/20/2018 until 3p is [phone] 12/20/2018 1:07 PM  **Pharmacist phone directory can be found on [email or web address] listed under [email or web address]**

## 2018-12-20 NOTE — Progress Notes (Addendum)
  Progress Note    12/20/2018 7:57 AM 1 Day Post-Op  Subjective:  No specific complaints  Afebrile HR 60's-70's NSR 63'F-354'T systolic 62-563% RA  Vitals:   12/19/18 2321 12/20/18 0509  BP: 96/60 113/71  Pulse: 74 70  Resp: 18 (!) 23  Temp: 97.6 F (36.4 C) 97.9 F (36.6 C)  SpO2: 96% 97%    Physical Exam: General:  No distress Lungs:  Non labored Incisions:  Bandage in place and is clean and dry  CBC    Component Value Date/Time   WBC 8.0 12/20/2018 0421   RBC 3.37 (L) 12/20/2018 0421   HGB 8.6 (L) 12/20/2018 0421   HCT 26.3 (L) 12/20/2018 0421   PLT 399 12/20/2018 0421   MCV 78.0 (L) 12/20/2018 0421   MCH 25.5 (L) 12/20/2018 0421   MCHC 32.7 12/20/2018 0421   RDW 20.9 (H) 12/20/2018 0421   LYMPHSABS 1.6 12/19/2018 1031   MONOABS 1.0 12/19/2018 1031   EOSABS 0.0 12/19/2018 1031   BASOSABS 0.0 12/19/2018 1031    BMET    Component Value Date/Time   NA 129 (L) 12/20/2018 0421   K 5.3 (H) 12/20/2018 0421   CL 99 12/20/2018 0421   CO2 19 (L) 12/20/2018 0421   GLUCOSE 362 (H) 12/20/2018 0421   BUN 40 (H) 12/20/2018 0421   CREATININE 1.55 (H) 12/20/2018 0421   CALCIUM 8.1 (L) 12/20/2018 0421   GFRNONAA 46 (L) 12/20/2018 0421   GFRAA 53 (L) 12/20/2018 0421    INR    Component Value Date/Time   INR 0.93 12/01/2012 1908     Intake/Output Summary (Last 24 hours) at 12/20/2018 0757 Last data filed at 12/20/2018 0510 Gross per 24 hour  Intake 6038.93 ml  Output 2000 ml  Net 4038.93 ml    Component 1d ago  Specimen Description WOUND LEFT LOWER LEG   Special Requests PATIENT ON FOLLOWING VANC AND ROCEPHIN   Gram Stain ABUNDANT WBC PRESENT,BOTH PMN AND MONONUCLEAR  MODERATE GRAM POSITIVE COCCI  Performed at [facility], [address], [place], [place] [address]   Culture PENDING   Report Status PENDING      Assessment:  68 y.o. male is s/p:  Incision and drainage of abscess in the medial proximal calf and popliteal space and a separate  area in the distal calf  1 Day Post-Op  Plan: -continue IV abx until final wound cx comes back. -plan for OR tomorrow for washout of wounds -npo after MN and consent (orders placed) -hgb stable after 3 units PRBC's.   Leontine Locket, PA-C [facility] [phone] 12/20/2018 7:57 AM  I have examined the patient, reviewed and agree with above.  Curt Jews, MD 12/20/2018 10:35 AM

## 2018-12-20 NOTE — Progress Notes (Addendum)
PROGRESS NOTE    [name]   NUU:[id number]  DOB: [birth year]  DOA: 12/19/2018 PCP: [name], MD   Brief Narrative:  [name]  is an 68 y.o. male with past medical history significant for IDDM on insulin pump diagnosed 2005, CKD 3, hypertension, severe peripheral vascular disease on Xarelto status post left leg stenting with stent stenosis, s/p subsequent left femoral anterior tibial bypass and multiple left toe amputations, who developed a wound on his left leg near his ankle that he has been taking care of at home. He states that about 2- 3 wks ago a separate swelling was noted behind his knee which eventually increased in size, and began to drain blood and pus. He was sent from the vascular surgery office.  Noted to have BP 79/52, Hb of 6.7, sodium 128, Bun 50, Cr 2.20, Lactic acid 3.2. He underwent I and D of his calf wound/abscess by vascular surgery after being admitted  Subjective: He feels better today compared to past few days. He has had a poor appetite and has been fatigued at home for at least 1 wk now.     Assessment & Plan:   Principal Problem:   Severe sepsis - wound on left leg   Severe PVD of left leg - his leg wounds have been managed as outpt by vascular surgery - s/p I and D on 6/26 by vascular surgery with noted tunneling of abscess into calf - Blood cultures NGTD - f/u on wound cultures- moderate gr + cocci on gram stain - cont Vanc, Cefepime and Flagyl - he will go back to the OR tomorrow for further I and D - vascular surgery to decide on when Xarelto can be resumed- it was started in the fall by Dr [name]  Active Problems:   Acute blood loss anemia superimposed on anemia of chronic disease - Hb 6.7 - baseline appears to be 8.2- he has had bleeding form his leg wound which may be the cause - microcytosis noted, however,  anemia panel is not consistent with Iron deficiency    Acute on chronic renal failure - CKD3   Hyponatremia - due to  dehydration in setting of HCTZ and poor oral intake at home - cont IVF, hold HCTZ    IDDM (insulin dependent diabetes mellitus)  - on insulin pump at home- cont Lantus and SSI for now- sugars elevated, increase lantus today    HTN (hypertension) - on Coreg, Maxzide and Losartan at home - cont only Coreg for now with holding parameters - Hold Maxzide and Losartan due to AKI, dehydration - follow BP  Gout - on Allopurinol     Time spent in minutes: 45 min - reviewed outpt notes and test results, extensive conversation with patient, discussed plan with sub-specialist and his wife DVT prophylaxis: per vascular surgery Code Status: Full code Family Communication: spoke with his wife Disposition Plan: cont to follow on Progressive care Consultants:   Vascular surgery Procedures:   I and D Antimicrobials:  Anti-infectives (From admission, onward)   Start     Dose/Rate Route Frequency Ordered Stop   12/20/18 1200  vancomycin (VANCOCIN) IVPB 1000 mg/200 mL premix     1,000 mg 200 mL/hr over 60 Minutes Intravenous Every 24 hours 12/19/18 1205     12/19/18 1700  metroNIDAZOLE (FLAGYL) IVPB 500 mg     500 mg 100 mL/hr over 60 Minutes Intravenous Every 8 hours 12/19/18 1600     12/19/18 1700  ceFEPIme (  MAXIPIME) 2 g in sodium chloride 0.9 % 100 mL IVPB     2 g 200 mL/hr over 30 Minutes Intravenous Every 12 hours 12/19/18 1543     12/19/18 1100  vancomycin (VANCOCIN) IVPB 1000 mg/200 mL premix  Status:  Discontinued     1,000 mg 200 mL/hr over 60 Minutes Intravenous  Once 12/19/18 1051 12/19/18 1058   12/19/18 1100  cefTRIAXone (ROCEPHIN) 2 g in sodium chloride 0.9 % 100 mL IVPB     2 g 200 mL/hr over 30 Minutes Intravenous  Once 12/19/18 1051 12/19/18 1155   12/19/18 1100  vancomycin (VANCOCIN) 2,000 mg in sodium chloride 0.9 % 500 mL IVPB     2,000 mg 250 mL/hr over 120 Minutes Intravenous  Once 12/19/18 1058 12/19/18 1358       Objective: Vitals:   12/19/18 1608 12/19/18  2017 12/19/18 2321 12/20/18 0509  BP: 107/72 117/81 96/60 113/71  Pulse: 69 72 74 70  Resp:  16 18 (!) 23  Temp: 97.6 F (36.4 C) (!) 97.4 F (36.3 C) 97.6 F (36.4 C) 97.9 F (36.6 C)  TempSrc: Oral Oral Oral Oral  SpO2: 100% 94% 96% 97%  Weight:      Height:        Intake/Output Summary (Last 24 hours) at 12/20/2018 0727 Last data filed at 12/20/2018 0510 Gross per 24 hour  Intake 6038.93 ml  Output 2000 ml  Net 4038.93 ml   Filed Weights   12/19/18 1031  Weight: 108 kg    Examination: General exam: Appears comfortable  HEENT: PERRLA, oral mucosa moist, no sclera icterus or thrush Respiratory system: Clear to auscultation. Respiratory effort normal. Cardiovascular system: S1 & S2 heard, RRR.   Gastrointestinal system: Abdomen soft, non-tender, nondistended. Normal bowel sounds. Central nervous system: Alert and oriented. No focal neurological deficits. Extremities: No cyanosis, clubbing - unable to examined left leg and foot due to dressing- left foot is swollen Psychiatry:  Mood & affect appropriate.     Data Reviewed: I have personally reviewed following labs and imaging studies  CBC: Recent Labs  Lab 12/19/18 1031 12/20/18 0421  WBC 10.6* 8.0  NEUTROABS 7.8*  --   HGB 6.7* 8.6*  HCT 21.8* 26.3*  MCV 73.6* 78.0*  PLT 555* 470   Basic Metabolic Panel: Recent Labs  Lab 12/19/18 1031 12/20/18 0421  NA 128* 129*  K 5.0 5.3*  CL 95* 99  CO2 20* 19*  GLUCOSE 244* 362*  BUN 50* 40*  CREATININE 2.20* 1.55*  CALCIUM 8.9 8.1*   GFR: Estimated Creatinine Clearance: 59.6 mL/min (A) (by C-G formula based on SCr of 1.55 mg/dL (H)). Liver Function Tests: Recent Labs  Lab 12/19/18 1031  AST 24  ALT 19  ALKPHOS 94  BILITOT 0.8  PROT 7.3  ALBUMIN 1.8*   No results for input(s): LIPASE, AMYLASE in the last 168 hours. No results for input(s): AMMONIA in the last 168 hours. Coagulation Profile: No results for input(s): INR, PROTIME in the last 168  hours. Cardiac Enzymes: No results for input(s): CKTOTAL, CKMB, CKMBINDEX, TROPONINI in the last 168 hours. BNP (last 3 results) No results for input(s): PROBNP in the last 8760 hours. HbA1C: No results for input(s): HGBA1C in the last 72 hours. CBG: Recent Labs  Lab 12/19/18 1421 12/19/18 1523 12/19/18 1613 12/19/18 2230 12/20/18 0610  GLUCAP 143* 148* 141* 276* 368*   Lipid Profile: No results for input(s): CHOL, HDL, LDLCALC, TRIG, CHOLHDL, LDLDIRECT in the last 72 hours.  Thyroid Function Tests: No results for input(s): TSH, T4TOTAL, FREET4, T3FREE, THYROIDAB in the last 72 hours. Anemia Panel: Recent Labs    12/19/18 1130 12/19/18 1610  VITAMINB12  --  3,342*  FOLATE  --  22.6  FERRITIN  --  367*  TIBC  --  197*  IRON  --  38*  RETICCTPCT 3.4*  --    Urine analysis:    Component Value Date/Time   COLORURINE YELLOW 12/19/2018 2015   APPEARANCEUR CLEAR 12/19/2018 2015   LABSPEC 1.011 12/19/2018 2015   PHURINE 5.0 12/19/2018 2015   GLUCOSEU NEGATIVE 12/19/2018 2015   HGBUR NEGATIVE 12/19/2018 2015   BILIRUBINUR NEGATIVE 12/19/2018 2015   KETONESUR NEGATIVE 12/19/2018 2015   PROTEINUR NEGATIVE 12/19/2018 2015   UROBILINOGEN 0.2 12/01/2012 2135   NITRITE NEGATIVE 12/19/2018 2015   LEUKOCYTESUR NEGATIVE 12/19/2018 2015   Sepsis Labs: @LABRCNTIP (procalcitonin:4,lacticidven:4) ) Recent Results (from the past 240 hour(s))  SARS Coronavirus 2 (CEPHEID - Performed in [place] hospital lab), Hosp Order     Status: None   Collection Time: 12/19/18 10:56 AM   Specimen: Nasopharyngeal Swab  Result Value Ref Range Status   SARS Coronavirus 2 NEGATIVE NEGATIVE Final    Comment: (NOTE) If result is NEGATIVE SARS-CoV-2 target nucleic acids are NOT DETECTED. The SARS-CoV-2 RNA is generally detectable in upper and lower  respiratory specimens during the acute phase of infection. The lowest  concentration of SARS-CoV-2 viral copies this assay can detect is 250  copies  / mL. A negative result does not preclude SARS-CoV-2 infection  and should not be used as the sole basis for treatment or other  patient management decisions.  A negative result may occur with  improper specimen collection / handling, submission of specimen other  than nasopharyngeal swab, presence of viral mutation(s) within the  areas targeted by this assay, and inadequate number of viral copies  (<250 copies / mL). A negative result must be combined with clinical  observations, patient history, and epidemiological information. If result is POSITIVE SARS-CoV-2 target nucleic acids are DETECTED. The SARS-CoV-2 RNA is generally detectable in upper and lower  respiratory specimens dur ing the acute phase of infection.  Positive  results are indicative of active infection with SARS-CoV-2.  Clinical  correlation with patient history and other diagnostic information is  necessary to determine patient infection status.  Positive results do  not rule out bacterial infection or co-infection with other viruses. If result is PRESUMPTIVE POSTIVE SARS-CoV-2 nucleic acids MAY BE PRESENT.   A presumptive positive result was obtained on the submitted specimen  and confirmed on repeat testing.  While 2019 novel coronavirus  (SARS-CoV-2) nucleic acids may be present in the submitted sample  additional confirmatory testing may be necessary for epidemiological  and / or clinical management purposes  to differentiate between  SARS-CoV-2 and other Sarbecovirus currently known to infect humans.  If clinically indicated additional testing with an alternate test  methodology [phone]) is advised. The SARS-CoV-2 RNA is generally  detectable in upper and lower respiratory sp ecimens during the acute  phase of infection. The expected result is Negative. Fact Sheet for Patients:  StrictlyIdeas.no Fact Sheet for Healthcare Providers: BankingDealers.co.za This test  is not yet approved or cleared by the [place] FDA and has been authorized for detection and/or diagnosis of SARS-CoV-2 by FDA under an Emergency Use Authorization (EUA).  This EUA will remain in effect (meaning this test can be used) for the duration of the COVID-19 declaration under  Section 564(b)(1) of the Act, 21 U.S.C. section 360bbb-3(b)(1), unless the authorization is terminated or revoked sooner. Performed at [facility], [place] [address] [place], [place] [address]   Aerobic/Anaerobic Culture (surgical/deep wound)     Status: None (Preliminary result)   Collection Time: 12/19/18  2:38 PM   Specimen: Wound  Result Value Ref Range Status   Specimen Description WOUND LEFT LOWER LEG  Final   Special Requests PATIENT ON FOLLOWING VANC AND ROCEPHIN  Final   Gram Stain   Final    ABUNDANT WBC PRESENT,BOTH PMN AND MONONUCLEAR MODERATE GRAM POSITIVE COCCI Performed at [facility], [place] [address], [place],  [address]    Culture PENDING  Incomplete   Report Status PENDING  Incomplete         Radiology Studies: Dg Chest Port 1 View  Result Date: 12/19/2018 CLINICAL DATA:  Pt states no chest pains, he is here with a 7 week old wound on his left tibia, 1 proximal, 1 distil which is quite large, both are medial and oozing, hx diabetes, htn, non smoker EXAM: PORTABLE CHEST - 1 VIEW COMPARISON:  04/29/2018 FINDINGS: Lungs are clear. Heart size and mediastinal contours are within normal limits. Aortic Atherosclerosis (ICD10-170.0). No effusion. Visualized bones unremarkable. IMPRESSION: No acute cardiopulmonary disease. Electronically Signed   By: Lucrezia Europe M.D.   On: 12/19/2018 11:23   Dg Tibia/fibula Left Port  Result Date: 12/19/2018 CLINICAL DATA:  Pt states no chest pains, he is here with a 61 week old wound on his left tibia, 1 proximal, 1 distil which is quite large, both are medial and oozing, hx diabetes, htn, non smoker EXAM: PORTABLE LEFT TIBIA AND FIBULA  - 2 VIEW COMPARISON:  None. FINDINGS: Subcutaneous gas in the medial proximal calf and distal thigh. Overlapping vascular stents in the visualized distal SFA and popliteal artery across the knee. Surgical clips in the medial and lower calf. Patchy popliteal and tibial arterial calcifications. Regional bones unremarkable. No focal demineralization. IMPRESSION: 1. Subcutaneous gas medially in the distal thigh and proximal calf. 2. Arterial stents and atheromatous calcifications. 3. No acute bone abnormality. Electronically Signed   By: Lucrezia Europe M.D.   On: 12/19/2018 11:26      Scheduled Meds: . carvedilol  12.5 mg Oral Q1200  . dorzolamide-timolol  1 drop Both Eyes BID  . insulin aspart  0-20 Units Subcutaneous TID WC  . insulin aspart  0-5 Units Subcutaneous QHS  . insulin glargine  20 Units Subcutaneous QHS  . latanoprost  1 drop Both Eyes QHS  . pravastatin  40 mg Oral QHS  . pregabalin  50 mg Oral QHS   Continuous Infusions: . sodium chloride Stopped (12/20/18 0010)  . sodium chloride 100 mL/hr at 12/20/18 0011  . ceFEPime (MAXIPIME) IV 2 g (12/20/18 0558)  . metronidazole Stopped (12/20/18 0113)  . vancomycin       LOS: 1 day      Debbe Odea, MD [facility] Pager: [email or web address] Password [facility] 12/20/2018, 7:27 AM

## 2018-12-21 ENCOUNTER — Inpatient Hospital Stay (HOSPITAL_COMMUNITY): Payer: BC Managed Care – PPO | Admitting: Anesthesiology

## 2018-12-21 ENCOUNTER — Encounter (HOSPITAL_COMMUNITY): Payer: Self-pay | Admitting: Anesthesiology

## 2018-12-21 ENCOUNTER — Encounter (HOSPITAL_COMMUNITY)
Admission: EM | Disposition: A | Discharge status: Rehabilitation Facility | Payer: Self-pay | Source: Ambulatory Visit | Attending: Internal Medicine

## 2018-12-21 DIAGNOSIS — T8189XD Other complications of procedures, not elsewhere classified, subsequent encounter: Secondary | ICD-10-CM

## 2018-12-21 DIAGNOSIS — I998 Other disorder of circulatory system: Secondary | ICD-10-CM

## 2018-12-21 HISTORY — PX: I & D EXTREMITY: SHX5045

## 2018-12-21 LAB — BASIC METABOLIC PANEL
Anion gap: 8 (ref 5–15)
BUN: 33 mg/dL — ABNORMAL HIGH (ref 8–23)
CO2: 21 mmol/L — ABNORMAL LOW (ref 22–32)
Calcium: 8.2 mg/dL — ABNORMAL LOW (ref 8.9–10.3)
Chloride: 103 mmol/L (ref 98–111)
Creatinine, Ser: 1.44 mg/dL — ABNORMAL HIGH (ref 0.61–1.24)
GFR calc Af Amer: 58 mL/min — ABNORMAL LOW (ref >60)
GFR calc non Af Amer: 50 mL/min — ABNORMAL LOW (ref >60)
Glucose, Bld: 288 mg/dL — ABNORMAL HIGH (ref 70–99)
Potassium: 4.6 mmol/L (ref 3.5–5.1)
Sodium: 132 mmol/L — ABNORMAL LOW (ref 135–145)

## 2018-12-21 LAB — CBC
HCT: 25.5 % — ABNORMAL LOW (ref 39.0–52.0)
Hemoglobin: 8.2 g/dL — ABNORMAL LOW (ref 13.0–17.0)
MCH: 25.4 pg — ABNORMAL LOW (ref 26.0–34.0)
MCHC: 32.2 g/dL (ref 30.0–36.0)
MCV: 78.9 fL — ABNORMAL LOW (ref 80.0–100.0)
Platelets: 373 10*3/uL (ref 150–400)
RBC: 3.23 MIL/uL — ABNORMAL LOW (ref 4.22–5.81)
RDW: 21.2 % — ABNORMAL HIGH (ref 11.5–15.5)
WBC: 9.4 10*3/uL (ref 4.0–10.5)
nRBC: 0 % (ref 0.0–0.2)

## 2018-12-21 LAB — GLUCOSE, CAPILLARY
Glucose-Capillary: 230 mg/dL — ABNORMAL HIGH (ref 70–99)
Glucose-Capillary: 158 mg/dL — ABNORMAL HIGH (ref 70–99)
Glucose-Capillary: 294 mg/dL — ABNORMAL HIGH (ref 70–99)
Glucose-Capillary: 448 mg/dL — ABNORMAL HIGH (ref 70–99)
Glucose-Capillary: 463 mg/dL — ABNORMAL HIGH (ref 70–99)
Glucose-Capillary: 492 mg/dL — ABNORMAL HIGH (ref 70–99)
Glucose-Capillary: 434 mg/dL — ABNORMAL HIGH (ref 70–99)

## 2018-12-21 LAB — SURGICAL PCR SCREEN
MRSA, PCR: POSITIVE — AB
Staphylococcus aureus: POSITIVE — AB

## 2018-12-21 SURGERY — IRRIGATION AND DEBRIDEMENT EXTREMITY
Anesthesia: General | Site: Leg Upper | Laterality: Left

## 2018-12-21 MED ORDER — LIDOCAINE HCL (CARDIAC) PF 100 MG/5ML IV SOSY
PREFILLED_SYRINGE | INTRAVENOUS | Status: DC | PRN
  Administered 2018-12-21: 100 mg via INTRAVENOUS

## 2018-12-21 MED ORDER — PHENYLEPHRINE 40 MCG/ML (10ML) SYRINGE FOR IV PUSH (FOR BLOOD PRESSURE SUPPORT)
PREFILLED_SYRINGE | INTRAVENOUS | Status: DC | PRN
  Administered 2018-12-21 (×3): 80 ug via INTRAVENOUS

## 2018-12-21 MED ORDER — PROPOFOL 10 MG/ML IV BOLUS
INTRAVENOUS | Status: AC
  Filled 2018-12-21: qty 20

## 2018-12-21 MED ORDER — PROPOFOL 10 MG/ML IV BOLUS
INTRAVENOUS | Status: DC | PRN
  Administered 2018-12-21: 150 mg via INTRAVENOUS

## 2018-12-21 MED ORDER — LACTATED RINGERS IV SOLN
INTRAVENOUS | Status: DC | PRN
  Administered 2018-12-21: 07:00:00 via INTRAVENOUS

## 2018-12-21 MED ORDER — INSULIN ASPART 100 UNIT/ML ~~LOC~~ SOLN
3.0000 [IU] | Freq: Three times a day (TID) | SUBCUTANEOUS | Status: DC
  Administered 2018-12-21 – 2018-12-22 (×4): 3 [IU] via SUBCUTANEOUS

## 2018-12-21 MED ORDER — MIDAZOLAM HCL 5 MG/5ML IJ SOLN
INTRAMUSCULAR | Status: DC | PRN
  Administered 2018-12-21: 2 mg via INTRAVENOUS

## 2018-12-21 MED ORDER — ONDANSETRON HCL 4 MG/2ML IJ SOLN
INTRAMUSCULAR | Status: AC
  Filled 2018-12-21: qty 2

## 2018-12-21 MED ORDER — DEXAMETHASONE SODIUM PHOSPHATE 10 MG/ML IJ SOLN
INTRAMUSCULAR | Status: AC
  Filled 2018-12-21: qty 1

## 2018-12-21 MED ORDER — OXYCODONE HCL 5 MG PO TABS: 5.0000 mg | ORAL_TABLET | Freq: Once | ORAL | Status: DC | PRN

## 2018-12-21 MED ORDER — FENTANYL CITRATE (PF) 100 MCG/2ML IJ SOLN
INTRAMUSCULAR | Status: DC | PRN
  Administered 2018-12-21: 50 ug via INTRAVENOUS
  Administered 2018-12-21: 100 ug via INTRAVENOUS

## 2018-12-21 MED ORDER — MEPERIDINE HCL 25 MG/ML IJ SOLN: 6.2500 mg | INTRAMUSCULAR | Status: DC | PRN

## 2018-12-21 MED ORDER — DEXAMETHASONE SODIUM PHOSPHATE 4 MG/ML IJ SOLN
INTRAMUSCULAR | Status: DC | PRN
  Administered 2018-12-21: 4 mg via INTRAVENOUS

## 2018-12-21 MED ORDER — LIDOCAINE 2% (20 MG/ML) 5 ML SYRINGE
INTRAMUSCULAR | Status: AC
  Filled 2018-12-21: qty 5

## 2018-12-21 MED ORDER — PHENYLEPHRINE 40 MCG/ML (10ML) SYRINGE FOR IV PUSH (FOR BLOOD PRESSURE SUPPORT)
PREFILLED_SYRINGE | INTRAVENOUS | Status: AC
  Filled 2018-12-21: qty 10

## 2018-12-21 MED ORDER — CEFAZOLIN SODIUM-DEXTROSE 1-4 GM/50ML-% IV SOLN
1.0000 g | Freq: Three times a day (TID) | INTRAVENOUS | Status: DC
  Administered 2018-12-21 – 2018-12-22 (×5): 1 g via INTRAVENOUS
  Administered 2018-12-23: 2 g via INTRAVENOUS
  Administered 2018-12-23 – 2018-12-26 (×10): 1 g via INTRAVENOUS
  Filled 2018-12-21 (×19): qty 50

## 2018-12-21 MED ORDER — COLCHICINE 0.6 MG PO TABS
0.6000 mg | ORAL_TABLET | Freq: Two times a day (BID) | ORAL | Status: DC
  Administered 2018-12-21 – 2018-12-26 (×10): 0.6 mg via ORAL
  Filled 2018-12-21 (×11): qty 1

## 2018-12-21 MED ORDER — HYDROMORPHONE HCL 1 MG/ML IJ SOLN
INTRAMUSCULAR | Status: AC
  Administered 2018-12-21: 09:00:00
  Filled 2018-12-21: qty 1

## 2018-12-21 MED ORDER — FERROUS SULFATE 325 (65 FE) MG PO TABS
325.0000 mg | ORAL_TABLET | Freq: Two times a day (BID) | ORAL | Status: DC
  Administered 2018-12-21 – 2018-12-26 (×9): 325 mg via ORAL
  Filled 2018-12-21 (×9): qty 1

## 2018-12-21 MED ORDER — OXYCODONE HCL 5 MG/5ML PO SOLN: 5.0000 mg | Freq: Once | ORAL | Status: DC | PRN

## 2018-12-21 MED ORDER — FENTANYL CITRATE (PF) 250 MCG/5ML IJ SOLN
INTRAMUSCULAR | Status: AC
  Filled 2018-12-21: qty 5

## 2018-12-21 MED ORDER — PROMETHAZINE HCL 25 MG/ML IJ SOLN: 6.2500 mg | INTRAMUSCULAR | Status: DC | PRN

## 2018-12-21 MED ORDER — ONDANSETRON HCL 4 MG/2ML IJ SOLN
INTRAMUSCULAR | Status: DC | PRN
  Administered 2018-12-21: 4 mg via INTRAVENOUS

## 2018-12-21 MED ORDER — 0.9 % SODIUM CHLORIDE (POUR BTL) OPTIME
TOPICAL | Status: DC | PRN
  Administered 2018-12-21: 1000 mL

## 2018-12-21 MED ORDER — MIDAZOLAM HCL 2 MG/2ML IJ SOLN
INTRAMUSCULAR | Status: AC
  Filled 2018-12-21: qty 2

## 2018-12-21 MED ORDER — INSULIN ASPART 100 UNIT/ML ~~LOC~~ SOLN
8.0000 [IU] | Freq: Once | SUBCUTANEOUS | Status: AC
  Administered 2018-12-21: 8 [IU] via SUBCUTANEOUS

## 2018-12-21 MED ORDER — HYDROMORPHONE HCL 1 MG/ML IJ SOLN
0.2500 mg | INTRAMUSCULAR | Status: DC | PRN
  Administered 2018-12-21: 0.5 mg via INTRAVENOUS

## 2018-12-21 MED ORDER — MUPIROCIN 2 % EX OINT
1.0000 "application " | TOPICAL_OINTMENT | Freq: Two times a day (BID) | CUTANEOUS | Status: AC
  Administered 2018-12-21 – 2018-12-24 (×9): 1 via NASAL
  Filled 2018-12-21 (×2): qty 22

## 2018-12-21 SURGICAL SUPPLY — 33 items
BANDAGE ACE 6X5 VEL STRL LF (GAUZE/BANDAGES/DRESSINGS) ×2 IMPLANT
BANDAGE ELASTIC 6 VELCRO ST LF (GAUZE/BANDAGES/DRESSINGS) ×2 IMPLANT
BNDG GAUZE ELAST 4 BULKY (GAUZE/BANDAGES/DRESSINGS) ×6 IMPLANT
CANISTER SUCT 3000ML PPV (MISCELLANEOUS) ×2 IMPLANT
CLIP LIGATING EXTRA MED SLVR (CLIP) IMPLANT
CLIP LIGATING EXTRA SM BLUE (MISCELLANEOUS) IMPLANT
COVER SURGICAL LIGHT HANDLE (MISCELLANEOUS) ×2 IMPLANT
COVER WAND RF STERILE (DRAPES) ×2 IMPLANT
DRAIN PENROSE 1/2X12 LTX STRL (WOUND CARE) ×2 IMPLANT
DRAPE U-SHAPE 47X51 STRL (DRAPES) IMPLANT
DRAPE U-SHAPE 76X120 STRL (DRAPES) IMPLANT
ELECT REM PT RETURN 9FT ADLT (ELECTROSURGICAL) ×2
ELECTRODE REM PT RTRN 9FT ADLT (ELECTROSURGICAL) ×1 IMPLANT
GAUZE SPONGE 4X4 12PLY STRL (GAUZE/BANDAGES/DRESSINGS) IMPLANT
GAUZE SPONGE 4X4 12PLY STRL LF (GAUZE/BANDAGES/DRESSINGS) ×2 IMPLANT
GLOVE SS BIOGEL STRL SZ 7.5 (GLOVE) ×1 IMPLANT
GLOVE SUPERSENSE BIOGEL SZ 7.5 (GLOVE) ×1
GOWN STRL REUS W/ TWL LRG LVL3 (GOWN DISPOSABLE) ×3 IMPLANT
GOWN STRL REUS W/TWL LRG LVL3 (GOWN DISPOSABLE) ×3
KIT BASIN OR (CUSTOM PROCEDURE TRAY) ×2 IMPLANT
KIT TURNOVER KIT B (KITS) ×2 IMPLANT
NS IRRIG 1000ML POUR BTL (IV SOLUTION) ×2 IMPLANT
PACK GENERAL/GYN (CUSTOM PROCEDURE TRAY) ×2 IMPLANT
PACK UNIVERSAL I (CUSTOM PROCEDURE TRAY) IMPLANT
PAD ABD 7.5X8 STRL (GAUZE/BANDAGES/DRESSINGS) ×6 IMPLANT
PAD ARMBOARD 7.5X6 YLW CONV (MISCELLANEOUS) ×4 IMPLANT
SUT ETHILON 2 0 FS 18 (SUTURE) ×2 IMPLANT
SUT ETHILON 3 0 PS 1 (SUTURE) IMPLANT
SUT VIC AB 2-0 CTX 36 (SUTURE) IMPLANT
SUT VIC AB 3-0 SH 27 (SUTURE)
SUT VIC AB 3-0 SH 27X BRD (SUTURE) IMPLANT
TOWEL GREEN STERILE (TOWEL DISPOSABLE) ×2 IMPLANT
WATER STERILE IRR 1000ML POUR (IV SOLUTION) ×2 IMPLANT

## 2018-12-21 NOTE — Progress Notes (Signed)
Received verbal order from Rizwan,MD to administer 15 units of Novolog SQ to cover CBG of 463. Will administer and continue to monitor.

## 2018-12-21 NOTE — Anesthesia Postprocedure Evaluation (Signed)
Anesthesia Post Note  Patient: [name]  Procedure(s) Performed: IRRIGATION AND DEBRIDEMENT LEFT LOWER EXTREMITY (Left Leg Upper)     Patient location during evaluation: PACU Anesthesia Type: General Level of consciousness: sedated and patient cooperative Pain management: pain level controlled Vital Signs Assessment: post-procedure vital signs reviewed and stable Respiratory status: spontaneous breathing Cardiovascular status: stable Anesthetic complications: no    Last Vitals:  Vitals:   12/21/18 1400 12/21/18 1500  BP: 121/77 118/76  Pulse:    Resp: 18 20  Temp:    SpO2: 100% 99%    Last Pain:  Vitals:   12/21/18 1300  TempSrc:   PainSc: [place]

## 2018-12-21 NOTE — Anesthesia Procedure Notes (Signed)
Procedure Name: LMA Insertion Date/Time: 12/21/2018 7:39 AM Performed by: Jenne Campus, CRNA Pre-anesthesia Checklist: Patient identified, Emergency Drugs available, Suction available and Patient being monitored Patient Re-evaluated:Patient Re-evaluated prior to induction Oxygen Delivery Method: Circle System Utilized Preoxygenation: Pre-oxygenation with 100% oxygen Induction Type: IV induction Ventilation: Mask ventilation without difficulty LMA: LMA inserted LMA Size: 5.0 Number of attempts: 1 Placement Confirmation: positive ETCO2 and breath sounds checked- equal and bilateral Tube secured with: Tape Dental Injury: Teeth and Oropharynx as per pre-operative assessment

## 2018-12-21 NOTE — Progress Notes (Signed)
Attempted to page Dr Wynelle Cleveland via [facility] system twice concerning Pt's CBG 448. Will await for response and continue to monitor.

## 2018-12-21 NOTE — Progress Notes (Signed)
Pt CBG 448. Re-checked CBG: 463. Wynelle Cleveland, MD paged concerning high CBG. Waiting for new orders at this time.

## 2018-12-21 NOTE — Anesthesia Preprocedure Evaluation (Addendum)
Anesthesia Evaluation  Patient identified by MRN, date of birth, ID band Patient awake    Reviewed: Allergy & Precautions, NPO status , Patient's Chart, lab work & pertinent test results, reviewed documented beta blocker date and time Preop documentation limited or incomplete due to emergent nature of procedure.  History of Anesthesia Complications Negative for: history of anesthetic complications  Airway Mallampati: II  TM Distance: >3 FB Neck ROM: Full    Dental  (+) Missing, Dental Advisory Given, Poor Dentition   Pulmonary sleep apnea , former smoker,  12/19/2018 SARS coronavirus NEG   breath sounds clear to auscultation       Cardiovascular hypertension, Pt. on medications and Pt. on home beta blockers (-) angina+ Peripheral Vascular Disease   Rhythm:Regular Rate:Normal  '14 ECHO: EF 60-65%, valves OK   Neuro/Psych    GI/Hepatic negative GI ROS, Neg liver ROS,   Endo/Other  diabetes, Insulin Dependent, Oral Hypoglycemic Agents  Renal/GU Renal InsufficiencyRenal disease     Musculoskeletal  (+) Arthritis ,   Abdominal   Peds  Hematology  (+) Blood dyscrasia, anemia , xarelto Hb 6.7, transfusion in progress   Anesthesia Other Findings   Reproductive/Obstetrics                            Anesthesia Physical  Anesthesia Plan  ASA: III and emergent  Anesthesia Plan: General   Post-op Pain Management:    Induction: Intravenous and Rapid sequence  PONV Risk Score and Plan: 2 and Ondansetron and Dexamethasone  Airway Management Planned: LMA  Additional Equipment:   Intra-op Plan:   Post-operative Plan: Extubation in OR  Informed Consent: I have reviewed the patients History and Physical, chart, labs and discussed the procedure including the risks, benefits and alternatives for the proposed anesthesia with the patient or authorized representative who has indicated his/her  understanding and acceptance.     Dental advisory given  Plan Discussed with: CRNA  Anesthesia Plan Comments:         Anesthesia Quick Evaluation

## 2018-12-21 NOTE — Progress Notes (Addendum)
PROGRESS NOTE    [name]   UKG:[id number]  DOB: 04/02/1951  DOA: 12/19/2018 PCP: [name], MD   Brief Narrative:  [name]  is an 68 y.o. male with past medical history significant for IDDM on insulin pump diagnosed 2005, CKD 3, hypertension, severe peripheral vascular disease on Xarelto status post left leg stenting with stent stenosis, s/p subsequent left femoral anterior tibial bypass and multiple left toe amputations, who developed a wound on his left leg near his ankle that he has been taking care of at home. He states that about 2- 3 wks ago a separate swelling was noted behind his knee which eventually increased in size, and began to drain blood and pus. He was sent from the vascular surgery office.  Noted to have BP 79/52, Hb of 6.7, sodium 128, Bun 50, Cr 2.20, Lactic acid 3.2. He underwent I and D of his calf wound/abscess by vascular surgery after being admitted  Subjective:  He has no complaints today.     Assessment & Plan:   Principal Problem:   Severe sepsis - wound on left leg   Severe PVD of left leg - his leg wounds have been managed as outpt by vascular surgery - s/p I and D on 6/26 by vascular surgery with noted tunneling of abscess into calf - Blood cultures NGTD - wound cultures- moderate gr + cocci on gram stain- results today show abundant group B strep  - will narrow antibiotics to Ancef today - further I and D performed today in OR- OR notes reviewed-  - will order a PT eval tomorrow - vascular surgery to decide on when Xarelto can be resumed- it was started in the fall by Dr [name] for severe PVD    Active Problems:   Acute blood loss anemia superimposed on anemia of chronic disease - Hb 6.7 - baseline appears to be 8.2- he has had bleeding form his leg wound which may be the cause - s/p transfusion of 1 U PRBC on 6/26 - microcytosis noted - Iron saturation is low normal and he could benefit from Iron replacement  orally- will start  this    Acute on chronic renal failure - CKD3   Hyponatremia - due to dehydration in setting of HCTZ and poor oral intake at home - Cr 2.20 on admission - baseline Cr ~ 1.2-1.7 - cont IVF, hold HCTZ - improving    IDDM (insulin dependent diabetes mellitus)  - on insulin pump at home- cont Lantus and SSI for now  - last A1c 6.5 in 06/28/18 - Increased Lantus last night- - add mealtime insulin today    Hypotension with h/o HTN (hypertension) - on Coreg, Maxzide and Losartan at home - Hold Maxzide and Losartan due to AKI, dehydration - hold Coreg as BP running low - follow BP  Gout - cont Allopurinol- resume Colchicine today     Time spent in minutes: 35 min -   DVT prophylaxis: per vascular surgery Code Status: Full code Family Communication: spoke with his wife Disposition Plan: cont to follow on Progressive care Consultants:   Vascular surgery Procedures:   I and D Antimicrobials:  Anti-infectives (From admission, onward)   Start     Dose/Rate Route Frequency Ordered Stop   12/21/18 1200  vancomycin (VANCOCIN) 1,250 mg in sodium chloride 0.9 % 250 mL IVPB     1,250 mg 166.7 mL/hr over 90 Minutes Intravenous Every 24 hours 12/20/18 1254     12/20/18  1200  vancomycin (VANCOCIN) IVPB 1000 mg/200 mL premix  Status:  Discontinued     1,000 mg 200 mL/hr over 60 Minutes Intravenous Every 24 hours 12/19/18 1205 12/20/18 1254   12/19/18 1700  metroNIDAZOLE (FLAGYL) IVPB 500 mg     500 mg 100 mL/hr over 60 Minutes Intravenous Every 8 hours 12/19/18 1600     12/19/18 1700  ceFEPIme (MAXIPIME) 2 g in sodium chloride 0.9 % 100 mL IVPB     2 g 200 mL/hr over 30 Minutes Intravenous Every 12 hours 12/19/18 1543     12/19/18 1100  vancomycin (VANCOCIN) IVPB 1000 mg/200 mL premix  Status:  Discontinued     1,000 mg 200 mL/hr over 60 Minutes Intravenous  Once 12/19/18 1051 12/19/18 1058   12/19/18 1100  cefTRIAXone (ROCEPHIN) 2 g in sodium chloride 0.9 % 100 mL IVPB     2 g 200  mL/hr over 30 Minutes Intravenous  Once 12/19/18 1051 12/19/18 1155   12/19/18 1100  vancomycin (VANCOCIN) 2,000 mg in sodium chloride 0.9 % 500 mL IVPB     2,000 mg 250 mL/hr over 120 Minutes Intravenous  Once 12/19/18 1058 12/19/18 1358       Objective: Vitals:   12/21/18 0828 12/21/18 0845 12/21/18 0900 12/21/18 0923  BP: 121/73 115/77 115/68 115/73  Pulse: 65 65 63 64  Resp: 14 16 18    Temp: 98.2 F (36.8 C)  98.4 F (36.9 C) 97.8 F (36.6 C)  TempSrc:    Oral  SpO2: 100% 100% 99% 98%  Weight:      Height:        Intake/Output Summary (Last 24 hours) at 12/21/2018 0943 Last data filed at 12/21/2018 0816 Gross per 24 hour  Intake 2519.14 ml  Output 2270 ml  Net 249.14 ml   Filed Weights   12/19/18 1031  Weight: 108 kg    Examination: General exam: Appears comfortable  HEENT: PERRLA, oral mucosa moist, no sclera icterus or thrush Respiratory system: Clear to auscultation. Respiratory effort normal. Cardiovascular system: S1 & S2 heard,  No murmurs  Gastrointestinal system: Abdomen soft, non-tender, nondistended. Normal bowel sounds   Central nervous system: Alert and oriented. No focal neurological deficits. Extremities: No cyanosis, clubbing - left foot quite edematous, dressing on leg not opened Skin: No rashes or ulcers Psychiatry:  Mood & affect appropriate.     Data Reviewed: I have personally reviewed following labs and imaging studies  CBC: Recent Labs  Lab 12/19/18 1031 12/20/18 0421 12/21/18 0407  WBC 10.6* 8.0 9.4  NEUTROABS 7.8*  --   --   HGB 6.7* 8.6* 8.2*  HCT 21.8* 26.3* 25.5*  MCV 73.6* 78.0* 78.9*  PLT 555* 399 517   Basic Metabolic Panel: Recent Labs  Lab 12/19/18 1031 12/20/18 0421 12/21/18 0407  NA 128* 129* 132*  K 5.0 5.3* 4.6  CL 95* 99 103  CO2 20* 19* 21*  GLUCOSE 244* 362* 288*  BUN 50* 40* 33*  CREATININE 2.20* 1.55* 1.44*  CALCIUM 8.9 8.1* 8.2*   GFR: Estimated Creatinine Clearance: 64.1 mL/min (A) (by C-G  formula based on SCr of 1.44 mg/dL (H)). Liver Function Tests: Recent Labs  Lab 12/19/18 1031  AST 24  ALT 19  ALKPHOS 94  BILITOT 0.8  PROT 7.3  ALBUMIN 1.8*   No results for input(s): LIPASE, AMYLASE in the last 168 hours. No results for input(s): AMMONIA in the last 168 hours. Coagulation Profile: No results for input(s): INR, PROTIME in  the last 168 hours. Cardiac Enzymes: No results for input(s): CKTOTAL, CKMB, CKMBINDEX, TROPONINI in the last 168 hours. BNP (last 3 results) No results for input(s): PROBNP in the last 8760 hours. HbA1C: No results for input(s): HGBA1C in the last 72 hours. CBG: Recent Labs  Lab 12/20/18 1518 12/20/18 1709 12/20/18 2111 12/21/18 0602 12/21/18 0834  GLUCAP 343* 304* 269* 230* 158*   Lipid Profile: No results for input(s): CHOL, HDL, LDLCALC, TRIG, CHOLHDL, LDLDIRECT in the last 72 hours. Thyroid Function Tests: No results for input(s): TSH, T4TOTAL, FREET4, T3FREE, THYROIDAB in the last 72 hours. Anemia Panel: Recent Labs    12/19/18 1130 12/19/18 1610  VITAMINB12  --  3,342*  FOLATE  --  22.6  FERRITIN  --  367*  TIBC  --  197*  IRON  --  38*  RETICCTPCT 3.4*  --    Urine analysis:    Component Value Date/Time   COLORURINE YELLOW 12/19/2018 2015   APPEARANCEUR CLEAR 12/19/2018 2015   LABSPEC 1.011 12/19/2018 2015   PHURINE 5.0 12/19/2018 2015   GLUCOSEU NEGATIVE 12/19/2018 2015   HGBUR NEGATIVE 12/19/2018 2015   BILIRUBINUR NEGATIVE 12/19/2018 2015   KETONESUR NEGATIVE 12/19/2018 2015   PROTEINUR NEGATIVE 12/19/2018 2015   UROBILINOGEN 0.2 12/01/2012 2135   NITRITE NEGATIVE 12/19/2018 2015   LEUKOCYTESUR NEGATIVE 12/19/2018 2015   Sepsis Labs: @LABRCNTIP (procalcitonin:4,lacticidven:4) ) Recent Results (from the past 240 hour(s))  Blood Culture (routine x 2)     Status: None (Preliminary result)   Collection Time: 12/19/18 10:36 AM   Specimen: BLOOD  Result Value Ref Range Status   Specimen Description BLOOD  BLOOD LEFT HAND  Final   Special Requests   Final    BOTTLES DRAWN AEROBIC AND ANAEROBIC Blood Culture adequate volume   Culture   Final    NO GROWTH 2 DAYS Performed at [facility], [place] [address] [place], [place] [address]    Report Status PENDING  Incomplete  Blood Culture (routine x 2)     Status: None (Preliminary result)   Collection Time: 12/19/18 10:48 AM   Specimen: BLOOD  Result Value Ref Range Status   Specimen Description BLOOD RIGHT ANTECUBITAL  Final   Special Requests   Final    BOTTLES DRAWN AEROBIC AND ANAEROBIC Blood Culture adequate volume   Culture   Final    NO GROWTH 2 DAYS Performed at [facility], [place] [address] [place], [place] [address]    Report Status PENDING  Incomplete  SARS Coronavirus 2 (CEPHEID - Performed in [place] hospital lab), Hosp Order     Status: None   Collection Time: 12/19/18 10:56 AM   Specimen: Nasopharyngeal Swab  Result Value Ref Range Status   SARS Coronavirus 2 NEGATIVE NEGATIVE Final    Comment: (NOTE) If result is NEGATIVE SARS-CoV-2 target nucleic acids are NOT DETECTED. The SARS-CoV-2 RNA is generally detectable in upper and lower  respiratory specimens during the acute phase of infection. The lowest  concentration of SARS-CoV-2 viral copies this assay can detect is 250  copies / mL. A negative result does not preclude SARS-CoV-2 infection  and should not be used as the sole basis for treatment or other  patient management decisions.  A negative result may occur with  improper specimen collection / handling, submission of specimen other  than nasopharyngeal swab, presence of viral mutation(s) within the  areas targeted by this assay, and inadequate number of viral copies  (<250 copies /  mL). A negative result must be combined with clinical  observations, patient history, and epidemiological information. If result is POSITIVE SARS-CoV-2 target nucleic acids are DETECTED. The SARS-CoV-2 RNA is generally  detectable in upper and lower  respiratory specimens dur ing the acute phase of infection.  Positive  results are indicative of active infection with SARS-CoV-2.  Clinical  correlation with patient history and other diagnostic information is  necessary to determine patient infection status.  Positive results do  not rule out bacterial infection or co-infection with other viruses. If result is PRESUMPTIVE POSTIVE SARS-CoV-2 nucleic acids MAY BE PRESENT.   A presumptive positive result was obtained on the submitted specimen  and confirmed on repeat testing.  While 2019 novel coronavirus  (SARS-CoV-2) nucleic acids may be present in the submitted sample  additional confirmatory testing may be necessary for epidemiological  and / or clinical management purposes  to differentiate between  SARS-CoV-2 and other Sarbecovirus currently known to infect humans.  If clinically indicated additional testing with an alternate test  methodology [id number]) is advised. The SARS-CoV-2 RNA is generally  detectable in upper and lower respiratory sp ecimens during the acute  phase of infection. The expected result is Negative. Fact Sheet for Patients:  StrictlyIdeas.no Fact Sheet for Healthcare Providers: BankingDealers.co.za This test is not yet approved or cleared by the [place] FDA and has been authorized for detection and/or diagnosis of SARS-CoV-2 by FDA under an Emergency Use Authorization (EUA).  This EUA will remain in effect (meaning this test can be used) for the duration of the COVID-19 declaration under Section 564(b)(1) of the Act, 21 U.S.C. section 360bbb-3(b)(1), unless the authorization is terminated or revoked sooner. Performed at [facility], [place] [address], [place], [place] [address]   Aerobic/Anaerobic Culture (surgical/deep wound)     Status: None (Preliminary result)   Collection Time: 12/19/18  2:38 PM   Specimen: Wound    Result Value Ref Range Status   Specimen Description WOUND LEFT LOWER LEG  Final   Special Requests PATIENT ON FOLLOWING VANC AND ROCEPHIN  Final   Gram Stain   Final    ABUNDANT WBC PRESENT,BOTH PMN AND MONONUCLEAR MODERATE GRAM POSITIVE COCCI    Culture   Final    ABUNDANT GROUP B STREP(S.AGALACTIAE)ISOLATED TESTING AGAINST S. AGALACTIAE NOT ROUTINELY PERFORMED DUE TO PREDICTABILITY OF AMP/PEN/VAN SUSCEPTIBILITY. Performed at [facility], [place] [address] [place], [place] [address]    Report Status PENDING  Incomplete  Surgical pcr screen     Status: Abnormal   Collection Time: 12/21/18 12:04 AM   Specimen: Nasal Mucosa; Nasal Swab  Result Value Ref Range Status   MRSA, PCR POSITIVE (A) NEGATIVE Final    Comment: RESULT CALLED TO, READ BACK BY AND VERIFIED WITH: RN T IRBY @0207  12/21/18 BY S GEZAHEGN    Staphylococcus aureus POSITIVE (A) NEGATIVE Final    Comment: (NOTE) The Xpert SA Assay (FDA approved for NASAL specimens in patients 61 years of age and older), is one component of a comprehensive surveillance program. It is not intended to diagnose infection nor to guide or monitor treatment. Performed at [facility], [place] [address] [place], [place] [address]          Radiology Studies: Dg Chest Port 1 View  Result Date: 12/19/2018 CLINICAL DATA:  Pt states no chest pains, he is here with a 57 week old wound on his left tibia, 1 proximal, 1 distil which is quite large, both  are medial and oozing, hx diabetes, htn, non smoker EXAM: PORTABLE CHEST - 1 VIEW COMPARISON:  04/29/2018 FINDINGS: Lungs are clear. Heart size and mediastinal contours are within normal limits. Aortic Atherosclerosis (ICD10-170.0). No effusion. Visualized bones unremarkable. IMPRESSION: No acute cardiopulmonary disease. Electronically Signed   By: Lucrezia Europe M.D.   On: 12/19/2018 11:23   Dg Tibia/fibula Left Port  Result Date: 12/19/2018 CLINICAL DATA:  Pt states no chest pains, he is  here with a 62 week old wound on his left tibia, 1 proximal, 1 distil which is quite large, both are medial and oozing, hx diabetes, htn, non smoker EXAM: PORTABLE LEFT TIBIA AND FIBULA - 2 VIEW COMPARISON:  None. FINDINGS: Subcutaneous gas in the medial proximal calf and distal thigh. Overlapping vascular stents in the visualized distal SFA and popliteal artery across the knee. Surgical clips in the medial and lower calf. Patchy popliteal and tibial arterial calcifications. Regional bones unremarkable. No focal demineralization. IMPRESSION: 1. Subcutaneous gas medially in the distal thigh and proximal calf. 2. Arterial stents and atheromatous calcifications. 3. No acute bone abnormality. Electronically Signed   By: Lucrezia Europe M.D.   On: 12/19/2018 11:26      Scheduled Meds:  carvedilol  12.5 mg Oral Q1200   dorzolamide-timolol  1 drop Both Eyes BID   HYDROmorphone       insulin aspart  0-15 Units Subcutaneous TID WC   insulin aspart  0-5 Units Subcutaneous QHS   insulin glargine  26 Units Subcutaneous QHS   latanoprost  1 drop Both Eyes QHS   mupirocin ointment  1 application Nasal BID   pravastatin  40 mg Oral QHS   pregabalin  50 mg Oral QHS   Continuous Infusions:  sodium chloride Stopped (12/20/18 0010)   sodium chloride Stopped (12/21/18 0634)   ceFEPime (MAXIPIME) IV Stopped (12/21/18 0601)   metronidazole 500 mg (12/20/18 2359)   vancomycin       LOS: 2 days      Debbe Odea, MD [facility] Pager: [email or web address] Password [facility] 12/21/2018, 9:43 AM

## 2018-12-21 NOTE — Progress Notes (Signed)
HS cbg 434, gave Lantus and received order from [facility] on-call NP for 8u novolog to give once.

## 2018-12-21 NOTE — Transfer of Care (Signed)
Immediate Anesthesia Transfer of Care Note  Patient: [name]  Procedure(s) Performed: IRRIGATION AND DEBRIDEMENT LEFT LOWER EXTREMITY (Left Leg Upper)  Patient Location: PACU  Anesthesia Type:General  Level of Consciousness: sedated and patient cooperative  Airway & Oxygen Therapy: Patient Spontanous Breathing and Patient connected to face mask oxygen  Post-op Assessment: Report given to RN and Post -op Vital signs reviewed and stable  Post vital signs: Reviewed  Last Vitals:  Vitals Value Taken Time  BP 121/73 12/21/18 0828  Temp    Pulse 64 12/21/18 0828  Resp 15 12/21/18 0828  SpO2 100 % 12/21/18 0828  Vitals shown include unvalidated device data.  Last Pain:  Vitals:   12/21/18 0533  TempSrc: Oral  PainSc:          Complications: No apparent anesthesia complications

## 2018-12-21 NOTE — Evaluation (Signed)
Physical Therapy Evaluation Patient Details Name: [name] MRN: [id number] DOB: [birth year] Today's Date: 12/21/2018   History of Present Illness  [name]  is an 68 y.o. male with past medical history significant for IDDM on insulin pump diagnosed 2005, CKD 3, hypertension, severe peripheral vascular disease on Xarelto status post left leg stenting with stent stenosis, s/p subsequent left femoral anterior tibial bypass and multiple left toe amputations, who developed a wound on his left leg near his ankle that he has been taking care of at home. He states that about 2- 3 wks ago a separate swelling was noted behind his knee which eventually increased in size, and began to drain blood and pus. He was sent from the vascular surgery office. He underwent I and D of his calf wound/abscess by vascular surgery after being admitted  Clinical Impression  Pt admitted with above diagnosis. Pt currently with functional limitations due to the deficits listed below (see PT Problem List). Pt was able to stand and pivot to recliner from bed with mod assist.  Pt overall did well and needs intensive rehab for left LE to improve mobility.  Recommend CIR so that pt can gain strength in left LE to go home and be able to be independent.  Will follow acutely. Pt will benefit from skilled PT to increase their independence and safety with mobility to allow discharge to the venue listed below.      Follow Up Recommendations CIR;Supervision/Assistance - 24 hour    Equipment Recommendations  None recommended by PT    Recommendations for Other Services Rehab consult     Precautions / Restrictions Precautions Precautions: Fall Restrictions Weight Bearing Restrictions: No      Mobility  Bed Mobility Overal bed mobility: Needs Assistance Bed Mobility: Supine to Sit     Supine to sit: Min assist     General bed mobility comments:  A little assist with trunk to EOB.   Transfers Overall transfer level:  Needs assistance Equipment used: Rolling walker (2 wheeled) Transfers: Sit to/from [facility] Sit to Stand: Mod assist;From elevated surface;Min assist Stand pivot transfers: Mod assist;Min assist;From elevated surface       General transfer comment: Pt is 6 feet 4 inches tall. Had to raise bed considerably for it to be high enough for pt to stand up.  Pt needed mod assist and cues to stand to RW.  Once up, pt needing steadying assist as he pivoted to chair. Pt with difficulty moving left LE as it is weak but he was able to move it and take steps to chair. Pt took incr time to pivot as well as incr time to back up to chair as it was difficult for him to weight bear on left LE to move right LE back.   Ambulation/Gait                Stairs            Wheelchair Mobility    Modified Rankin (Stroke Patients Only)       Balance Overall balance assessment: Needs assistance Sitting-balance support: No upper extremity supported;Feet supported Sitting balance-Leahy Scale: Fair     Standing balance support: Bilateral upper extremity supported;During functional activity Standing balance-Leahy Scale: Poor Standing balance comment: relies on UE support for balance                             Pertinent Vitals/Pain  Pain Assessment: Faces Faces Pain Scale: Hurts little more Pain Location: left LE Pain Descriptors / Indicators: Aching;Grimacing;Guarding Pain Intervention(s): Limited activity within patient's tolerance;Monitored during session;Repositioned    Home Living Family/patient expects to be discharged to:: Private residence Living Arrangements: Spouse/significant other Available Help at Discharge: Family Type of Home: House Home Access: Level entry;Stairs to enter Entrance Stairs-Rails: Right Entrance Stairs-Number of Steps: stairs at main entrance but uses level entry in basement  Home Layout: Two level Home Equipment: Walker - 2  wheels;Bedside commode;Wheelchair - manual(lift chair he stays in most of time)      Prior Function Level of Independence: Needs assistance   Gait / Transfers Assistance Needed: States he has been independent with transfers to wheelchair and 3N1.  Walked with RW some until the LE was swollen again  ADL's / Homemaking Assistance Needed: Sponge bathes        Hand Dominance   Dominant Hand: Right    Extremity/Trunk Assessment   Upper Extremity Assessment Upper Extremity Assessment: Defer to OT evaluation    Lower Extremity Assessment Lower Extremity Assessment: LLE deficits/detail LLE Deficits / Details: grossly 3-/5    Cervical / Trunk Assessment Cervical / Trunk Assessment: Normal  Communication   Communication: No difficulties  Cognition Arousal/Alertness: Awake/alert Behavior During Therapy: WFL for tasks assessed/performed Overall Cognitive Status: Within Functional Limits for tasks assessed                                        General Comments General comments (skin integrity, edema, etc.): left LE swollen and wrapped in ace wrap    Exercises General Exercises - Lower Extremity Ankle Circles/Pumps: AROM;Both;10 reps;Seated Long Arc Quad: AROM;Both;10 reps;Seated Heel Slides: AROM;Both;10 reps;Supine   Assessment/Plan    PT Assessment Patient needs continued PT services  PT Problem List Decreased activity tolerance;Decreased balance;Decreased mobility;Decreased strength;Decreased range of motion;Decreased knowledge of use of DME;Decreased safety awareness;Decreased knowledge of precautions;Pain       PT Treatment Interventions DME instruction;Gait training;Functional mobility training;Therapeutic activities;Therapeutic exercise;Balance training;Patient/family education    PT Goals (Current goals can be found in the Care Plan section)  Acute Rehab PT Goals Patient Stated Goal: to go home PT Goal Formulation: With patient Time For Goal  Achievement: 01/04/19 Potential to Achieve Goals: Good    Frequency Min 3X/week   Barriers to discharge        Co-evaluation               AM-PAC PT "6 Clicks" Mobility  Outcome Measure Help needed turning from your back to your side while in a flat bed without using bedrails?: None Help needed moving from lying on your back to sitting on the side of a flat bed without using bedrails?: A Little Help needed moving to and from a bed to a chair (including a wheelchair)?: A Lot Help needed standing up from a chair using your arms (e.g., wheelchair or bedside chair)?: Total Help needed to walk in hospital room?: Total Help needed climbing 3-5 steps with a railing? : Total 6 Click Score: 12    End of Session Equipment Utilized During Treatment: Gait belt Activity Tolerance: Patient limited by fatigue Patient left: in chair;with call bell/phone within reach;with chair alarm set Nurse Communication: Mobility status PT Visit Diagnosis: Muscle weakness (generalized) (M62.81);Unsteadiness on feet (R26.81)    Time: 9629-5284 PT Time Calculation (min) (ACUTE ONLY): 30 min  Charges:   PT Evaluation $PT Eval Moderate Complexity: 1 Mod PT Treatments $Therapeutic Activity: 8-22 mins        Rashied Corallo,PT Acute Rehabilitation Services Pager:  [phone]  Office:  [place] 12/21/2018, 3:35 PM

## 2018-12-21 NOTE — Op Note (Signed)
    OPERATIVE REPORT  DATE OF SURGERY: 12/21/2018  PATIENT: [name], 68 y.o. male MRN: [id number]  DOB: 22-Jan-1951  PRE-OPERATIVE DIAGNOSIS: Open wound left calf and popliteal area  POST-OPERATIVE DIAGNOSIS:  Same  PROCEDURE: Dressing change and debridement of left calf and popliteal wound  SURGEON:  Curt Jews, M.D.  PHYSICIAN ASSISTANT: Nurse  ANESTHESIA: LMA  EBL: per anesthesia record  Total I/O In: 500 [I.V.:500] Out: 20 [Blood:20]  BLOOD ADMINISTERED: none  DRAINS: none  SPECIMEN: none  COUNTS CORRECT:  YES  PATIENT DISPOSITION:  PACU - hemodynamically stable  PROCEDURE DETAILS: Patient was taken up and placed supine position where the area of the left leg prepped draped in usual sterile fashion.  The gauze had been placed 48 hours earlier was removed.  The lower calf incision was clean.  There was purulence behind the knee and the upper incision.  Both wounds were copiously irrigated and there was a slight amount of old clot in the area behind the knee which was removed.  1/2 inch Penrose drain was placed in the base of the wound behind the knee and secured to the skin with a 2-0 nylon stitch.  The wounds were repacked with Betadine soaked Kerlix with an ABD Kerlix and Ace wrap over these.  The patient was transferred to the recovery room in stable condition  Called the patient's wife and discussed the findings by telephone due to COVID restrictions visitation   Rosetta Posner, M.D., [facility] 12/21/2018 8:53 AM

## 2018-12-21 NOTE — Progress Notes (Signed)
Rehab Admissions Coordinator Note:  Patient was screened by Michel Santee for appropriateness for an Inpatient Acute Rehab Consult.  At this time, we are recommending Inpatient Rehab consult.  Please place IP Rehab consult order.  Also note, pt will need OT Eval and Treat orders.   Michel Santee 12/21/2018, 8:49 PM  I can be reached at [phone].

## 2018-12-22 ENCOUNTER — Encounter (HOSPITAL_COMMUNITY): Payer: Self-pay | Admitting: Vascular Surgery

## 2018-12-22 LAB — CBC
HCT: 24.9 % — ABNORMAL LOW (ref 39.0–52.0)
Hemoglobin: 8.1 g/dL — ABNORMAL LOW (ref 13.0–17.0)
MCH: 26 pg (ref 26.0–34.0)
MCHC: 32.5 g/dL (ref 30.0–36.0)
MCV: 79.8 fL — ABNORMAL LOW (ref 80.0–100.0)
Platelets: 387 10*3/uL (ref 150–400)
RBC: 3.12 MIL/uL — ABNORMAL LOW (ref 4.22–5.81)
RDW: 21.9 % — ABNORMAL HIGH (ref 11.5–15.5)
WBC: 9.2 10*3/uL (ref 4.0–10.5)
nRBC: 0 % (ref 0.0–0.2)

## 2018-12-22 LAB — BASIC METABOLIC PANEL
Anion gap: 8 (ref 5–15)
BUN: 23 mg/dL (ref 8–23)
CO2: 22 mmol/L (ref 22–32)
Calcium: 8 mg/dL — ABNORMAL LOW (ref 8.9–10.3)
Chloride: 103 mmol/L (ref 98–111)
Creatinine, Ser: 1.15 mg/dL (ref 0.61–1.24)
GFR calc Af Amer: >60 mL/min (ref >60)
GFR calc non Af Amer: >60 mL/min (ref >60)
Glucose, Bld: 252 mg/dL — ABNORMAL HIGH (ref 70–99)
Potassium: 4.4 mmol/L (ref 3.5–5.1)
Sodium: 133 mmol/L — ABNORMAL LOW (ref 135–145)

## 2018-12-22 LAB — LACTIC ACID, PLASMA: Lactic Acid, Venous: 2.8 mmol/L (ref 0.5–1.9)

## 2018-12-22 LAB — GLUCOSE, CAPILLARY
Glucose-Capillary: 220 mg/dL — ABNORMAL HIGH (ref 70–99)
Glucose-Capillary: 199 mg/dL — ABNORMAL HIGH (ref 70–99)
Glucose-Capillary: 281 mg/dL — ABNORMAL HIGH (ref 70–99)
Glucose-Capillary: 342 mg/dL — ABNORMAL HIGH (ref 70–99)

## 2018-12-22 MED ORDER — POLYETHYLENE GLYCOL 3350 17 G PO PACK
17.0000 g | PACK | Freq: Every day | ORAL | Status: DC
  Administered 2018-12-23 – 2018-12-26 (×4): 17 g via ORAL
  Filled 2018-12-22 (×5): qty 1

## 2018-12-22 MED ORDER — INSULIN GLARGINE 100 UNIT/ML ~~LOC~~ SOLN
10.0000 [IU] | Freq: Once | SUBCUTANEOUS | Status: AC
  Administered 2018-12-22: 10 [IU] via SUBCUTANEOUS
  Filled 2018-12-22: qty 0.1

## 2018-12-22 NOTE — Progress Notes (Signed)
PROGRESS NOTE    JOAH PATLAN   LTJ:[id number]  DOB: 12/16/1950  DOA: 12/19/2018 PCP: [name], MD   Brief Narrative:  [name]  is an 68 y.o. male with past medical history significant for IDDM on insulin pump diagnosed 2005, CKD 3, hypertension, severe peripheral vascular disease on Xarelto status post left leg stenting with stent stenosis, s/p subsequent left femoral anterior tibial bypass and multiple left toe amputations, who developed a wound on his left leg near his ankle that he has been taking care of at home. He states that about 2- 3 wks ago a separate swelling was noted behind his knee which eventually increased in size, and began to drain blood and pus. He was sent from the vascular surgery office.  Noted to have BP 79/52, Hb of 6.7, sodium 128, Bun 50, Cr 2.20, Lactic acid 3.2. He underwent I and D of his calf wound/abscess by vascular surgery after being admitted  Subjective:  He has no complaints today. Agreeable to go to CIR for rehab.     Assessment & Plan:   Principal Problem:   Severe sepsis - wound on left leg   Severe PVD of left leg - his leg wounds have been managed as outpt by vascular surgery - s/p I and D on 6/26 by vascular surgery with noted tunneling of abscess into calf - Blood cultures NGTD - wound cultures- moderate gr + cocci on gram stain- results today show abundant group B strep  - cont Ancef today - 6/28- further I and D performed - OR notes reviewed-  - vascular surgery to decide on when Xarelto can be resumed- it was started in the fall by Dr [name] for severe PVD - PT recommends CIR- have consulted them- patient in agreement    Active Problems:   Acute blood loss anemia superimposed on anemia of chronic disease - Hb 6.7 - baseline appears to be 8.2- he has had bleeding form his leg wound which may be the cause - s/p transfusion of 1 U PRBC on 6/26 - microcytosis noted - Iron saturation is low normal and he could benefit from Iron  replacement orally which was started yesterday    Acute on chronic renal failure - CKD3   Hyponatremia - due to dehydration in setting of HCTZ and poor oral intake at home - Cr 2.20 on admission - baseline Cr ~ 1.2-1.7 -   hold HCTZ - improving     IDDM (insulin dependent diabetes mellitus)  - on insulin pump at home- cont Lantus and SSI for now  - last A1c 6.5 in 06/28/18 - Increased Lantus 2 days ago - - added mealtime insulin yesterday - sugars elevated- add 10 U lantus this AM    Hypotension with h/o HTN (hypertension) - on Coreg, Maxzide and Losartan at home - Hold Maxzide and Losartan due to AKI, dehydration - holding Coreg as BP running low -  BP improved- cont to follow  Gout - cont Allopurinol & Colchicine      Time spent in minutes: 35 min -   DVT prophylaxis: per vascular surgery Code Status: Full code Family Communication: spoke with his wife Disposition Plan: plan for CIR Consultants:   Vascular surgery Procedures:   I and D Antimicrobials:  Anti-infectives (From admission, onward)   Start     Dose/Rate Route Frequency Ordered Stop   12/21/18 1400  ceFAZolin (ANCEF) IVPB 1 g/50 mL premix     1 g 100 mL/hr  over 30 Minutes Intravenous Every 8 hours 12/21/18 0959     12/21/18 1200  vancomycin (VANCOCIN) 1,250 mg in sodium chloride 0.9 % 250 mL IVPB  Status:  Discontinued     1,250 mg 166.7 mL/hr over 90 Minutes Intravenous Every 24 hours 12/20/18 1254 12/21/18 0959   12/20/18 1200  vancomycin (VANCOCIN) IVPB 1000 mg/200 mL premix  Status:  Discontinued     1,000 mg 200 mL/hr over 60 Minutes Intravenous Every 24 hours 12/19/18 1205 12/20/18 1254   12/19/18 1700  metroNIDAZOLE (FLAGYL) IVPB 500 mg  Status:  Discontinued     500 mg 100 mL/hr over 60 Minutes Intravenous Every 8 hours 12/19/18 1600 12/21/18 0959   12/19/18 1700  ceFEPIme (MAXIPIME) 2 g in sodium chloride 0.9 % 100 mL IVPB  Status:  Discontinued     2 g 200 mL/hr over 30 Minutes Intravenous  Every 12 hours 12/19/18 1543 12/21/18 0959   12/19/18 1100  vancomycin (VANCOCIN) IVPB 1000 mg/200 mL premix  Status:  Discontinued     1,000 mg 200 mL/hr over 60 Minutes Intravenous  Once 12/19/18 1051 12/19/18 1058   12/19/18 1100  cefTRIAXone (ROCEPHIN) 2 g in sodium chloride 0.9 % 100 mL IVPB     2 g 200 mL/hr over 30 Minutes Intravenous  Once 12/19/18 1051 12/19/18 1155   12/19/18 1100  vancomycin (VANCOCIN) 2,000 mg in sodium chloride 0.9 % 500 mL IVPB     2,000 mg 250 mL/hr over 120 Minutes Intravenous  Once 12/19/18 1058 12/19/18 1358       Objective: Vitals:   12/21/18 1800 12/21/18 1935 12/22/18 0400 12/22/18 0436  BP: 133/78 (!) 145/93 (!) 145/85   Pulse:  70    Resp:  (!) 22 19   Temp:  97.9 F (36.6 C) 97.6 F (36.4 C)   TempSrc:  Oral Oral   SpO2: 99% 100% 98%   Weight:    100.1 kg  Height:        Intake/Output Summary (Last 24 hours) at 12/22/2018 0729 Last data filed at 12/22/2018 0429 Gross per 24 hour  Intake 2043.45 ml  Output 4385 ml  Net -2341.55 ml   Filed Weights   12/19/18 1031 12/22/18 0436  Weight: 108 kg 100.1 kg    Examination: General exam: Appears comfortable  HEENT: PERRLA, oral mucosa moist, no sclera icterus or thrush Respiratory system: Clear to auscultation. Respiratory effort normal. Cardiovascular system: S1 & S2 heard,  No murmurs  Gastrointestinal system: Abdomen soft, non-tender, nondistended. Normal bowel sounds   Central nervous system: Alert and oriented. No focal neurological deficits. Extremities: No cyanosis, clubbing - significant edema of left foot noted Skin: see picture below of left leg Psychiatry:  Mood & affect appropriate.       Data Reviewed: I have personally reviewed following labs and imaging studies  CBC: Recent Labs  Lab 12/19/18 1031 12/20/18 0421 12/21/18 0407 12/22/18 0435  WBC 10.6* 8.0 9.4 9.2  NEUTROABS 7.8*  --   --   --   HGB 6.7* 8.6* 8.2* 8.1*  HCT 21.8* 26.3* 25.5* 24.9*  MCV  73.6* 78.0* 78.9* 79.8*  PLT 555* 399 373 161   Basic Metabolic Panel: Recent Labs  Lab 12/19/18 1031 12/20/18 0421 12/21/18 0407 12/22/18 0435  NA 128* 129* 132* 133*  K 5.0 5.3* 4.6 4.4  CL 95* 99 103 103  CO2 20* 19* 21* 22  GLUCOSE 244* 362* 288* 252*  BUN 50* 40* 33* 23  CREATININE  2.20* 1.55* 1.44* 1.15  CALCIUM 8.9 8.1* 8.2* 8.0*   GFR: Estimated Creatinine Clearance: 77.6 mL/min (by C-G formula based on SCr of 1.15 mg/dL). Liver Function Tests: Recent Labs  Lab 12/19/18 1031  AST 24  ALT 19  ALKPHOS 94  BILITOT 0.8  PROT 7.3  ALBUMIN 1.8*   No results for input(s): LIPASE, AMYLASE in the last 168 hours. No results for input(s): AMMONIA in the last 168 hours. Coagulation Profile: No results for input(s): INR, PROTIME in the last 168 hours. Cardiac Enzymes: No results for input(s): CKTOTAL, CKMB, CKMBINDEX, TROPONINI in the last 168 hours. BNP (last 3 results) No results for input(s): PROBNP in the last 8760 hours. HbA1C: No results for input(s): HGBA1C in the last 72 hours. CBG: Recent Labs  Lab 12/21/18 1658 12/21/18 1703 12/21/18 1809 12/21/18 2111 12/22/18 0614  GLUCAP 448* 463* 492* 434* 220*   Lipid Profile: No results for input(s): CHOL, HDL, LDLCALC, TRIG, CHOLHDL, LDLDIRECT in the last 72 hours. Thyroid Function Tests: No results for input(s): TSH, T4TOTAL, FREET4, T3FREE, THYROIDAB in the last 72 hours. Anemia Panel: Recent Labs    12/19/18 1130 12/19/18 1610  VITAMINB12  --  3,342*  FOLATE  --  22.6  FERRITIN  --  367*  TIBC  --  197*  IRON  --  38*  RETICCTPCT 3.4*  --    Urine analysis:    Component Value Date/Time   COLORURINE YELLOW 12/19/2018 2015   APPEARANCEUR CLEAR 12/19/2018 2015   LABSPEC 1.011 12/19/2018 2015   PHURINE 5.0 12/19/2018 2015   GLUCOSEU NEGATIVE 12/19/2018 2015   HGBUR NEGATIVE 12/19/2018 2015   BILIRUBINUR NEGATIVE 12/19/2018 2015   KETONESUR NEGATIVE 12/19/2018 2015   PROTEINUR NEGATIVE  12/19/2018 2015   UROBILINOGEN 0.2 12/01/2012 2135   NITRITE NEGATIVE 12/19/2018 2015   LEUKOCYTESUR NEGATIVE 12/19/2018 2015   Sepsis Labs: @LABRCNTIP (procalcitonin:4,lacticidven:4) ) Recent Results (from the past 240 hour(s))  Blood Culture (routine x 2)     Status: None (Preliminary result)   Collection Time: 12/19/18 10:36 AM   Specimen: BLOOD  Result Value Ref Range Status   Specimen Description BLOOD BLOOD LEFT HAND  Final   Special Requests   Final    BOTTLES DRAWN AEROBIC AND ANAEROBIC Blood Culture adequate volume   Culture   Final    NO GROWTH 2 DAYS Performed at [facility], [place] [address] [place], [place] [address]    Report Status PENDING  Incomplete  Blood Culture (routine x 2)     Status: None (Preliminary result)   Collection Time: 12/19/18 10:48 AM   Specimen: BLOOD  Result Value Ref Range Status   Specimen Description BLOOD RIGHT ANTECUBITAL  Final   Special Requests   Final    BOTTLES DRAWN AEROBIC AND ANAEROBIC Blood Culture adequate volume   Culture   Final    NO GROWTH 2 DAYS Performed at [facility], [place] [address], [place], [place] [address]    Report Status PENDING  Incomplete  SARS Coronavirus 2 (CEPHEID - Performed in [place] [facility] lab), Hosp Order     Status: None   Collection Time: 12/19/18 10:56 AM   Specimen: Nasopharyngeal Swab  Result Value Ref Range Status   SARS Coronavirus 2 NEGATIVE NEGATIVE Final    Comment: (NOTE) If result is NEGATIVE SARS-CoV-2 target nucleic acids are NOT DETECTED. The SARS-CoV-2 RNA is generally detectable in upper and lower  respiratory specimens during the acute phase of infection. The  lowest  concentration of SARS-CoV-2 viral copies this assay can detect is 250  copies / mL. A negative result does not preclude SARS-CoV-2 infection  and should not be used as the sole basis for treatment or other  patient management decisions.  A negative result may occur with  improper specimen  collection / handling, submission of specimen other  than nasopharyngeal swab, presence of viral mutation(s) within the  areas targeted by this assay, and inadequate number of viral copies  (<250 copies / mL). A negative result must be combined with clinical  observations, patient history, and epidemiological information. If result is POSITIVE SARS-CoV-2 target nucleic acids are DETECTED. The SARS-CoV-2 RNA is generally detectable in upper and lower  respiratory specimens dur ing the acute phase of infection.  Positive  results are indicative of active infection with SARS-CoV-2.  Clinical  correlation with patient history and other diagnostic information is  necessary to determine patient infection status.  Positive results do  not rule out bacterial infection or co-infection with other viruses. If result is PRESUMPTIVE POSTIVE SARS-CoV-2 nucleic acids MAY BE PRESENT.   A presumptive positive result was obtained on the submitted specimen  and confirmed on repeat testing.  While 2019 novel coronavirus  (SARS-CoV-2) nucleic acids may be present in the submitted sample  additional confirmatory testing may be necessary for epidemiological  and / or clinical management purposes  to differentiate between  SARS-CoV-2 and other Sarbecovirus currently known to infect humans.  If clinically indicated additional testing with an alternate test  methodology [phone]) is advised. The SARS-CoV-2 RNA is generally  detectable in upper and lower respiratory sp ecimens during the acute  phase of infection. The expected result is Negative. Fact Sheet for Patients:  StrictlyIdeas.no Fact Sheet for Healthcare Providers: BankingDealers.co.za This test is not yet approved or cleared by the [place] FDA and has been authorized for detection and/or diagnosis of SARS-CoV-2 by FDA under an Emergency Use Authorization (EUA).  This EUA will remain in effect  (meaning this test can be used) for the duration of the COVID-19 declaration under Section 564(b)(1) of the Act, 21 U.S.C. section 360bbb-3(b)(1), unless the authorization is terminated or revoked sooner. Performed at [facility], [place] [address] [place], [place] [address]   Aerobic/Anaerobic Culture (surgical/deep wound)     Status: None (Preliminary result)   Collection Time: 12/19/18  2:38 PM   Specimen: Wound  Result Value Ref Range Status   Specimen Description WOUND LEFT LOWER LEG  Final   Special Requests PATIENT ON FOLLOWING VANC AND ROCEPHIN  Final   Gram Stain   Final    ABUNDANT WBC PRESENT,BOTH PMN AND MONONUCLEAR MODERATE GRAM POSITIVE COCCI Performed at [facility], [place] [address] [place],  [address]    Culture   Final    ABUNDANT GROUP B STREP(S.AGALACTIAE)ISOLATED TESTING AGAINST S. AGALACTIAE NOT ROUTINELY PERFORMED DUE TO PREDICTABILITY OF AMP/PEN/VAN SUSCEPTIBILITY. NO ANAEROBES ISOLATED; CULTURE IN PROGRESS FOR 5 DAYS    Report Status PENDING  Incomplete  Surgical pcr screen     Status: Abnormal   Collection Time: 12/21/18 12:04 AM   Specimen: Nasal Mucosa; Nasal Swab  Result Value Ref Range Status   MRSA, PCR POSITIVE (A) NEGATIVE Final    Comment: RESULT CALLED TO, READ BACK BY AND VERIFIED WITH: RN T IRBY @0207  12/21/18 BY S GEZAHEGN    Staphylococcus aureus POSITIVE (A) NEGATIVE Final    Comment: (NOTE) The Xpert SA Assay (FDA approved for  NASAL specimens in patients 10 years of age and older), is one component of a comprehensive surveillance program. It is not intended to diagnose infection nor to guide or monitor treatment. Performed at [facility], [place] [address], [place], [place] [address]          Radiology Studies: No results found.    Scheduled Meds: . colchicine  0.6 mg Oral BID  . dorzolamide-timolol  1 drop Both Eyes BID  . ferrous sulfate  325 mg Oral BID WC  . insulin aspart  0-15 Units  Subcutaneous TID WC  . insulin aspart  0-5 Units Subcutaneous QHS  . insulin aspart  3 Units Subcutaneous TID WC  . insulin glargine  10 Units Subcutaneous Once  . insulin glargine  26 Units Subcutaneous QHS  . latanoprost  1 drop Both Eyes QHS  . mupirocin ointment  1 application Nasal BID  . pravastatin  40 mg Oral QHS  . pregabalin  50 mg Oral QHS   Continuous Infusions: . sodium chloride Stopped (12/20/18 0010)  . sodium chloride 100 mL/hr at 12/21/18 2158  .  ceFAZolin (ANCEF) IV 1 g (12/22/18 0620)     LOS: 3 days      Debbe Odea, MD [facility] Pager: [email or web address] Password [email or web address] 12/22/2018, 7:29 AM

## 2018-12-22 NOTE — Progress Notes (Addendum)
  Progress Note    12/22/2018 7:28 AM 1 Day Post-Op  Subjective:  Says he can move his leg better today  Afebrile HR 40's-70's SB/NSR 546'F systolic 68% RA  Vitals:   12/21/18 1935 12/22/18 0400  BP: (!) 145/93 (!) 145/85  Pulse: 70   Resp: (!) 22 19  Temp: 97.9 F (36.6 C) 97.6 F (36.4 C)  SpO2: 100% 98%    Physical Exam: General:  No distress Lungs:  Non labored Incisions:  Bandage in place and clean and dry Extremities:  Left foot warm; toe amp site healed.   CBC    Component Value Date/Time   WBC 9.2 12/22/2018 0435   RBC 3.12 (L) 12/22/2018 0435   HGB 8.1 (L) 12/22/2018 0435   HCT 24.9 (L) 12/22/2018 0435   PLT 387 12/22/2018 0435   MCV 79.8 (L) 12/22/2018 0435   MCH 26.0 12/22/2018 0435   MCHC 32.5 12/22/2018 0435   RDW 21.9 (H) 12/22/2018 0435   LYMPHSABS 1.6 12/19/2018 1031   MONOABS 1.0 12/19/2018 1031   EOSABS 0.0 12/19/2018 1031   BASOSABS 0.0 12/19/2018 1031    BMET    Component Value Date/Time   NA 133 (L) 12/22/2018 0435   K 4.4 12/22/2018 0435   CL 103 12/22/2018 0435   CO2 22 12/22/2018 0435   GLUCOSE 252 (H) 12/22/2018 0435   BUN 23 12/22/2018 0435   CREATININE 1.15 12/22/2018 0435   CALCIUM 8.0 (L) 12/22/2018 0435   GFRNONAA >60 12/22/2018 0435   GFRAA >60 12/22/2018 0435    INR    Component Value Date/Time   INR 0.93 12/01/2012 1908     Intake/Output Summary (Last 24 hours) at 12/22/2018 0728 Last data filed at 12/22/2018 0429 Gross per 24 hour  Intake 2043.45 ml  Output 4385 ml  Net -2341.55 ml    Specimen Description WOUND LEFT LOWER LEG   Special Requests PATIENT ON FOLLOWING VANC AND ROCEPHIN   Gram Stain ABUNDANT WBC PRESENT,BOTH PMN AND MONONUCLEAR  MODERATE GRAM POSITIVE COCCI  Performed at [facility], [address], [place], [place] [address]   Culture ABUNDANT GROUP B STREP(S.AGALACTIAE)ISOLATED  TESTING AGAINST S. AGALACTIAE NOT ROUTINELY PERFORMED DUE TO PREDICTABILITY OF AMP/PEN/VAN  SUSCEPTIBILITY.  NO ANAEROBES ISOLATED; CULTURE IN PROGRESS FOR 5 DAYS   Report Status PENDING     Assessment:  68 y.o. male is s/p:  Incision and drainage of abscess in the medial proximal calf and popliteal space and a separate area in the distal calf 2 Days Post-Op and Dressing change and debridement of left calf and popliteal wound  1 Day Post-Op   Plan: -dressing in place and clean -wound cx with abundant group B strep (S agalactiae)-abx changed to Ancef q8h yesterday -most likely will need dressing change in OR in next day or two -repeat lactic acid ordered for this morning -ok to work with PT       Leontine Locket, PA-C [facility] [phone] 12/22/2018 7:28 AM  I have independently interviewed patient and agree with PA assessment and plan above.  Plan for repeat operative intervention tomorrow.  He still has purulence draining from the most cephalad site and may need opening of his thigh incision.  I discussed this with the patient.  N.p.o. past midnight.  Daine Gunther C. Donzetta Matters, MD [facility] [place] Office: [phone] Pager: [phone]

## 2018-12-22 NOTE — Progress Notes (Signed)
Inpatient Diabetes Program Recommendations  AACE/ADA: New Consensus Statement on Inpatient Glycemic Control (2015)  Target Ranges:  Prepandial:   less than 140 mg/dL      Peak postprandial:   less than 180 mg/dL (1-2 hours)      Critically ill patients:  140 - 180 mg/dL   Lab Results  Component Value Date   GLUCAP 199 (H) 12/22/2018   HGBA1C 6.5 (H) 06/28/2018  Results for [name], [name] (MRN [id number]) as of 12/22/2018 12:47  Ref. Range 12/21/2018 17:03 12/21/2018 18:09 12/21/2018 21:11 12/22/2018 06:14 12/22/2018 12:22  Glucose-Capillary Latest Ref Range: 70 - 99 mg/dL 463 (H) 492 (H) 434 (H) 220 (H) 199 (H)    Review of Glycemic Control  Diabetes history: type 2 Outpatient Diabetes medications: V-Go 30, Novolog 0-15 units TID, Lantus 20 U daily ? Current orders for Inpatient glycemic control: Lantus 26 units daily, Novolog MODERATE correction scale TID & HS, Novolog 3 U TID  Inpatient Diabetes Program Recommendations:   Noted that patient's CBGs have been greater than 300-400 mg/dl. Recommend increasing Lantus to 30 units daily (which would be equal to V Go 30 daily), continue Novolog MODERATE correction scale TID & HS, and may need to increase Novolog meal coverage to 5 units TID.  Noted that Lantus 10 units was given in addition to Lanuts 26 units today. Will continue to monitor blood sugars while in the hospital.  Harvel Ricks RN BSN CDE Diabetes Coordinator Pager: [phone]  8am-5pm

## 2018-12-22 NOTE — Progress Notes (Signed)
CRITICAL VALUE ALERT  Critical Value: Lactic Acid 2.8    Date & Time Notied:  6/29 11:06 AM  Provider Notified: Wynelle Cleveland MD  Orders Received/Actions taken: MD paged.

## 2018-12-22 NOTE — Progress Notes (Signed)
Physical Therapy Treatment Patient Details Name: [name] MRN: [id number] DOB: 08-09-1950 Today's Date: 12/22/2018    History of Present Illness [name]  is an 68 y.o. male with past medical history significant for IDDM on insulin pump diagnosed 2005, CKD 3, hypertension, severe peripheral vascular disease on Xarelto status post left leg stenting with stent stenosis, s/p subsequent left femoral anterior tibial bypass and multiple left toe amputations, who developed a wound on his left leg near his ankle that he has been taking care of at home. He states that about 2- 3 wks ago a separate swelling was noted behind his knee which eventually increased in size, and began to drain blood and pus. He was sent from the vascular surgery office. He underwent I and D of his calf wound/abscess by vascular surgery after being admitted    PT Comments    Pt was seen for progression from bed to chair with repositioning on pillows to encourage active use of L knee and extend it.  Pt is struggling with WB on LLE due to tightness behind L knee, but with pillows followed all instructions to do ROM as PT taught.  Follow up with him to work toward greater distances with gait, to increase L knee ROM and to add activity as he can tolerate with the limitations of L knee pain.  Pt is motivated as he is hoping to be home soon.   Follow Up Recommendations  CIR;Supervision/Assistance - 24 hour     Equipment Recommendations  None recommended by PT    Recommendations for Other Services Rehab consult     Precautions / Restrictions Precautions Precautions: Fall Restrictions Weight Bearing Restrictions: No    Mobility  Bed Mobility Overal bed mobility: Needs Assistance Bed Mobility: Supine to Sit     Supine to sit: Min guard     General bed mobility comments: contact for safety on trunk  Transfers Overall transfer level: Needs assistance Equipment used: Rolling walker (2 wheeled);1 person hand held  assist Transfers: Sit to/from Stand Sit to Stand: Mod assist;From elevated surface            Ambulation/Gait Ambulation/Gait assistance: Min assist Gait Distance (Feet): 4 Feet Assistive device: Rolling walker (2 wheeled);1 person hand held assist   Gait velocity: reduced Gait velocity interpretation: <1.8 ft/sec, indicate of risk for recurrent falls General Gait Details: hopping on RLE with pt unable to tolerate WB on L LE once he begins to stand upright, but less painful to initiate stand from a L knee flexed posture   Stairs             Wheelchair Mobility    Modified Rankin (Stroke Patients Only)       Balance Overall balance assessment: Needs assistance Sitting-balance support: Feet supported Sitting balance-Leahy Scale: Good     Standing balance support: Bilateral upper extremity supported;During functional activity Standing balance-Leahy Scale: Poor Standing balance comment: requires help to balance with NWB essentially on LLE                            Cognition Arousal/Alertness: Awake/alert Behavior During Therapy: WFL for tasks assessed/performed Overall Cognitive Status: Within Functional Limits for tasks assessed                                        Exercises General Exercises -  Lower Extremity Ankle Circles/Pumps: AROM;Both;5 reps Quad Sets: AROM;Both;10 reps    General Comments General comments (skin integrity, edema, etc.): edema and bruising on L knee with pain to extend esp in WB      Pertinent Vitals/Pain Pain Assessment: 0-10 Faces Pain Scale: Hurts whole lot Pain Location: left LE Pain Descriptors / Indicators: Operative site guarding;Aching Pain Intervention(s): Limited activity within patient's tolerance;Monitored during session;Premedicated before session;Repositioned    Home Living                      Prior Function            PT Goals (current goals can now be found in the care  plan section) Acute Rehab PT Goals Patient Stated Goal: to go home Progress towards PT goals: Progressing toward goals    Frequency    Min 3X/week      PT Plan Current plan remains appropriate    Co-evaluation              AM-PAC PT "6 Clicks" Mobility   Outcome Measure  Help needed turning from your back to your side while in a flat bed without using bedrails?: None Help needed moving from lying on your back to sitting on the side of a flat bed without using bedrails?: A Little Help needed moving to and from a bed to a chair (including a wheelchair)?: A Little Help needed standing up from a chair using your arms (e.g., wheelchair or bedside chair)?: A Lot Help needed to walk in hospital room?: A Lot Help needed climbing 3-5 steps with a railing? : Total 6 Click Score: 15    End of Session Equipment Utilized During Treatment: Gait belt Activity Tolerance: Treatment limited secondary to agitation Patient left: in chair;with call bell/phone within reach;with chair alarm set Nurse Communication: Mobility status PT Visit Diagnosis: Muscle weakness (generalized) (M62.81);Unsteadiness on feet (R26.81)     Time: 4076-8088 PT Time Calculation (min) (ACUTE ONLY): 31 min  Charges:  $Gait Training: 8-22 mins $Therapeutic Exercise: 8-22 mins                    Ramond Dial 12/22/2018, 12:31 PM  Mee Hives, PT MS Acute Rehab Dept. Number: [place] [facility] [place]

## 2018-12-22 NOTE — Progress Notes (Signed)
Inpatient Rehab Admissions:  Inpatient Rehab Consult received.  I met with patient at the bedside for rehabilitation assessment and to discuss goals and expectations of an inpatient rehab admission.  He is open to CIR stay.  Wife can provide assist at discharge if needed, but expect pt will be able to achieve mod I in 12-14 days.  Will need insurance authorization before I can proceed with possible admission.   Signed: Shann Medal, PT, DPT Admissions Coordinator [phone] 12/22/18  2:36 PM

## 2018-12-23 ENCOUNTER — Encounter (HOSPITAL_COMMUNITY)
Admission: EM | Disposition: A | Discharge status: Rehabilitation Facility | Payer: Self-pay | Source: Ambulatory Visit | Attending: Internal Medicine

## 2018-12-23 ENCOUNTER — Inpatient Hospital Stay (HOSPITAL_COMMUNITY): Payer: BC Managed Care – PPO | Admitting: Certified Registered Nurse Anesthetist

## 2018-12-23 DIAGNOSIS — T8142XA Infection following a procedure, deep incisional surgical site, initial encounter: Secondary | ICD-10-CM

## 2018-12-23 HISTORY — PX: I & D EXTREMITY: SHX5045

## 2018-12-23 LAB — CBC
HCT: 25.6 % — ABNORMAL LOW (ref 39.0–52.0)
Hemoglobin: 8.3 g/dL — ABNORMAL LOW (ref 13.0–17.0)
MCH: 25.6 pg — ABNORMAL LOW (ref 26.0–34.0)
MCHC: 32.4 g/dL (ref 30.0–36.0)
MCV: 79 fL — ABNORMAL LOW (ref 80.0–100.0)
Platelets: 344 10*3/uL (ref 150–400)
RBC: 3.24 MIL/uL — ABNORMAL LOW (ref 4.22–5.81)
RDW: 22.4 % — ABNORMAL HIGH (ref 11.5–15.5)
WBC: 11.2 10*3/uL — ABNORMAL HIGH (ref 4.0–10.5)
nRBC: 0.4 % — ABNORMAL HIGH (ref 0.0–0.2)

## 2018-12-23 LAB — BASIC METABOLIC PANEL
Anion gap: 8 (ref 5–15)
BUN: 21 mg/dL (ref 8–23)
CO2: 22 mmol/L (ref 22–32)
Calcium: 8.1 mg/dL — ABNORMAL LOW (ref 8.9–10.3)
Chloride: 102 mmol/L (ref 98–111)
Creatinine, Ser: 1 mg/dL (ref 0.61–1.24)
GFR calc Af Amer: >60 mL/min (ref >60)
GFR calc non Af Amer: >60 mL/min (ref >60)
Glucose, Bld: 97 mg/dL (ref 70–99)
Potassium: 3.9 mmol/L (ref 3.5–5.1)
Sodium: 132 mmol/L — ABNORMAL LOW (ref 135–145)

## 2018-12-23 LAB — GLUCOSE, CAPILLARY
Glucose-Capillary: 189 mg/dL — ABNORMAL HIGH (ref 70–99)
Glucose-Capillary: 51 mg/dL — ABNORMAL LOW (ref 70–99)
Glucose-Capillary: 68 mg/dL — ABNORMAL LOW (ref 70–99)
Glucose-Capillary: 86 mg/dL (ref 70–99)
Glucose-Capillary: 91 mg/dL (ref 70–99)
Glucose-Capillary: 300 mg/dL — ABNORMAL HIGH (ref 70–99)
Glucose-Capillary: 346 mg/dL — ABNORMAL HIGH (ref 70–99)

## 2018-12-23 SURGERY — IRRIGATION AND DEBRIDEMENT EXTREMITY
Anesthesia: General | Site: Leg Lower | Laterality: Left

## 2018-12-23 MED ORDER — INSULIN ASPART 100 UNIT/ML ~~LOC~~ SOLN
5.0000 [IU] | Freq: Three times a day (TID) | SUBCUTANEOUS | Status: DC
  Administered 2018-12-23 – 2018-12-26 (×8): 5 [IU] via SUBCUTANEOUS

## 2018-12-23 MED ORDER — DEXTROSE 50 % IV SOLN
25.0000 mL | Freq: Once | INTRAVENOUS | Status: AC
  Administered 2018-12-23: 25 mL via INTRAVENOUS
  Filled 2018-12-23: qty 50

## 2018-12-23 MED ORDER — DEXTROSE 50 % IV SOLN
INTRAVENOUS | Status: AC
  Administered 2018-12-23: 25 mL via INTRAVENOUS
  Filled 2018-12-23: qty 50

## 2018-12-23 MED ORDER — FLEET ENEMA 7-19 GM/118ML RE ENEM
1.0000 | ENEMA | Freq: Once | RECTAL | Status: AC
  Administered 2018-12-24: 1 via RECTAL
  Filled 2018-12-23: qty 1

## 2018-12-23 MED ORDER — MORPHINE SULFATE (PF) 2 MG/ML IV SOLN
2.0000 mg | INTRAVENOUS | Status: DC | PRN
  Administered 2018-12-23 – 2018-12-24 (×2): 2 mg via INTRAVENOUS
  Filled 2018-12-23 (×2): qty 1

## 2018-12-23 MED ORDER — FENTANYL CITRATE (PF) 100 MCG/2ML IJ SOLN
INTRAMUSCULAR | Status: DC | PRN
  Administered 2018-12-23: 50 ug via INTRAVENOUS
  Administered 2018-12-23 (×2): 25 ug via INTRAVENOUS
  Administered 2018-12-23 (×2): 50 ug via INTRAVENOUS

## 2018-12-23 MED ORDER — FENTANYL CITRATE (PF) 250 MCG/5ML IJ SOLN
INTRAMUSCULAR | Status: AC
  Filled 2018-12-23: qty 5

## 2018-12-23 MED ORDER — LACTATED RINGERS IV SOLN
INTRAVENOUS | Status: DC | PRN
  Administered 2018-12-23 (×2): via INTRAVENOUS

## 2018-12-23 MED ORDER — BISACODYL 10 MG RE SUPP
10.0000 mg | Freq: Once | RECTAL | Status: DC
  Filled 2018-12-23: qty 1

## 2018-12-23 MED ORDER — LIDOCAINE 2% (20 MG/ML) 5 ML SYRINGE
INTRAMUSCULAR | Status: DC | PRN
  Administered 2018-12-23: 100 mg via INTRAVENOUS

## 2018-12-23 MED ORDER — LACTATED RINGERS IV SOLN
INTRAVENOUS | Status: DC
  Administered 2018-12-23: 10:00:00 via INTRAVENOUS

## 2018-12-23 MED ORDER — CEFAZOLIN SODIUM-DEXTROSE 2-4 GM/100ML-% IV SOLN
INTRAVENOUS | Status: AC
  Filled 2018-12-23: qty 100

## 2018-12-23 MED ORDER — MIDAZOLAM HCL 5 MG/5ML IJ SOLN
INTRAMUSCULAR | Status: DC | PRN
  Administered 2018-12-23: 2 mg via INTRAVENOUS

## 2018-12-23 MED ORDER — SODIUM CHLORIDE 0.9 % IR SOLN
Status: DC | PRN
  Administered 2018-12-23: 3000 mL

## 2018-12-23 MED ORDER — EPHEDRINE 5 MG/ML INJ
INTRAVENOUS | Status: AC
  Filled 2018-12-23: qty 20

## 2018-12-23 MED ORDER — SODIUM CHLORIDE 0.9 % IV SOLN
INTRAVENOUS | Status: DC | PRN
  Administered 2018-12-23: 50 ug/min via INTRAVENOUS

## 2018-12-23 MED ORDER — LIDOCAINE 2% (20 MG/ML) 5 ML SYRINGE
INTRAMUSCULAR | Status: AC
  Filled 2018-12-23: qty 15

## 2018-12-23 MED ORDER — ONDANSETRON HCL 4 MG/2ML IJ SOLN
INTRAMUSCULAR | Status: DC | PRN
  Administered 2018-12-23: 4 mg via INTRAVENOUS

## 2018-12-23 MED ORDER — MIDAZOLAM HCL 2 MG/2ML IJ SOLN
INTRAMUSCULAR | Status: AC
  Filled 2018-12-23: qty 2

## 2018-12-23 MED ORDER — DEXAMETHASONE SODIUM PHOSPHATE 10 MG/ML IJ SOLN
INTRAMUSCULAR | Status: DC | PRN
  Administered 2018-12-23: 10 mg via INTRAVENOUS

## 2018-12-23 MED ORDER — ACETAMINOPHEN 325 MG PO TABS
650.0000 mg | ORAL_TABLET | Freq: Once | ORAL | Status: AC
  Administered 2018-12-23: 650 mg via ORAL
  Filled 2018-12-23: qty 2

## 2018-12-23 MED ORDER — DEXAMETHASONE SODIUM PHOSPHATE 10 MG/ML IJ SOLN
INTRAMUSCULAR | Status: AC
  Filled 2018-12-23: qty 5

## 2018-12-23 MED ORDER — PHENYLEPHRINE 40 MCG/ML (10ML) SYRINGE FOR IV PUSH (FOR BLOOD PRESSURE SUPPORT)
PREFILLED_SYRINGE | INTRAVENOUS | Status: AC
  Filled 2018-12-23: qty 30

## 2018-12-23 MED ORDER — ONDANSETRON HCL 4 MG/2ML IJ SOLN
INTRAMUSCULAR | Status: AC
  Filled 2018-12-23: qty 6

## 2018-12-23 SURGICAL SUPPLY — 36 items
BANDAGE ACE 4X5 VEL STRL LF (GAUZE/BANDAGES/DRESSINGS) IMPLANT
BANDAGE ACE 6X5 VEL STRL LF (GAUZE/BANDAGES/DRESSINGS) IMPLANT
BANDAGE ELASTIC 4 VELCRO ST LF (GAUZE/BANDAGES/DRESSINGS) ×1 IMPLANT
BANDAGE ELASTIC 6 VELCRO ST LF (GAUZE/BANDAGES/DRESSINGS) ×1 IMPLANT
BNDG GAUZE ELAST 4 BULKY (GAUZE/BANDAGES/DRESSINGS) ×1 IMPLANT
CANISTER SUCT 3000ML PPV (MISCELLANEOUS) ×2 IMPLANT
COVER SURGICAL LIGHT HANDLE (MISCELLANEOUS) ×2 IMPLANT
COVER WAND RF STERILE (DRAPES) ×2 IMPLANT
DRAPE EXTREMITY T 121X128X90 (DISPOSABLE) IMPLANT
DRAPE HALF SHEET 40X57 (DRAPES) IMPLANT
DRAPE INCISE IOBAN 66X45 STRL (DRAPES) IMPLANT
DRAPE ORTHO SPLIT 77X108 STRL (DRAPES) ×2
DRAPE SURG ORHT 6 SPLT 77X108 (DRAPES) IMPLANT
DRSG ADAPTIC 3X8 NADH LF (GAUZE/BANDAGES/DRESSINGS) IMPLANT
ELECT REM PT RETURN 9FT ADLT (ELECTROSURGICAL) ×2
ELECTRODE REM PT RTRN 9FT ADLT (ELECTROSURGICAL) ×1 IMPLANT
GAUZE SPONGE 4X4 12PLY STRL LF (GAUZE/BANDAGES/DRESSINGS) ×2 IMPLANT
GLOVE BIO SURGEON STRL SZ7.5 (GLOVE) ×2 IMPLANT
GOWN STRL REUS W/ TWL LRG LVL3 (GOWN DISPOSABLE) ×2 IMPLANT
GOWN STRL REUS W/ TWL XL LVL3 (GOWN DISPOSABLE) ×1 IMPLANT
GOWN STRL REUS W/TWL LRG LVL3 (GOWN DISPOSABLE) ×2
GOWN STRL REUS W/TWL XL LVL3 (GOWN DISPOSABLE) ×1
KIT BASIN OR (CUSTOM PROCEDURE TRAY) ×2 IMPLANT
KIT TURNOVER KIT B (KITS) ×2 IMPLANT
NS IRRIG 1000ML POUR BTL (IV SOLUTION) ×2 IMPLANT
PACK GENERAL/GYN (CUSTOM PROCEDURE TRAY) IMPLANT
PAD ARMBOARD 7.5X6 YLW CONV (MISCELLANEOUS) ×4 IMPLANT
SUT ETHILON 3 0 PS 1 (SUTURE) IMPLANT
SUT VIC AB 2-0 CT1 27 (SUTURE)
SUT VIC AB 2-0 CT1 TAPERPNT 27 (SUTURE) IMPLANT
SUT VIC AB 3-0 SH 27 (SUTURE)
SUT VIC AB 3-0 SH 27X BRD (SUTURE) IMPLANT
SUT VICRYL 4-0 PS2 18IN ABS (SUTURE) IMPLANT
TOWEL GREEN STERILE (TOWEL DISPOSABLE) ×4 IMPLANT
TOWEL GREEN STERILE FF (TOWEL DISPOSABLE) ×2 IMPLANT
WATER STERILE IRR 1000ML POUR (IV SOLUTION) ×2 IMPLANT

## 2018-12-23 NOTE — Progress Notes (Addendum)
PROGRESS NOTE    [name]   HDQ:[id number]  DOB: [birth year]  DOA: 12/19/2018 PCP: [name], MD   Brief Narrative:  [name]  is an 68 y.o. male with past medical history significant for IDDM on insulin pump diagnosed 2005, CKD 3, hypertension, severe peripheral vascular disease on Xarelto status post left leg stenting with stent stenosis, s/p subsequent left femoral anterior tibial bypass and multiple left toe amputations, who developed a wound on his left leg near his ankle that he has been taking care of at home. He states that about 2- 3 wks ago a separate swelling was noted behind his knee which eventually increased in size, and began to drain blood and pus. He was sent from the vascular surgery office.  Noted to have BP 79/52, Hb of 6.7, sodium 128, Bun 50, Cr 2.20, Lactic acid 3.2. He underwent I and D of his calf wound/abscess by vascular surgery after being admitted  Subjective: He is constipated. No other complaints.     Assessment & Plan:   Principal Problem:   Severe sepsis - wound on left leg   Severe PVD of left leg - his leg wounds have been managed as outpt by vascular surgery - s/p I and D on 6/26 by vascular surgery with noted tunneling of abscess into calf - Blood cultures NGTD - wound cultures- moderate gr + cocci on gram stain- results show abundant group B strep  - cont Ancef  - 6/28- further I and D performed - OR notes reviewed-  - vascular surgery to decide on when Xarelto can be resumed- it was started in the fall by Dr [name] for severe PVD - PT recommends CIR- have consulted them- patient in agreement - recurrence of fever last night- he continues to have pus draining from the wound and will go back for another I and D-    Active Problems:   Acute blood loss anemia superimposed on anemia of chronic disease - Hb 6.7 - baseline appears to be 8.2- he has had bleeding form his leg wound which may be the cause - s/p transfusion of 1 U PRBC on  6/26 - microcytosis noted - Iron saturation is low normal and he could benefit from Iron replacement orally which has been started    Acute on chronic renal failure - CKD3   Hyponatremia - due to dehydration in setting of HCTZ and poor oral intake at home-   hold HCTZ - - baseline Cr ~ 1.2-1.7 - Cr 2.20 on admission- improved after IVF which have been discontinued - Cr now down to 1.0 but remains slightly hyponatremia- follow      IDDM (insulin dependent diabetes mellitus)  - on insulin pump at home- cont Lantus and SSI for now  - last A1c 6.5 in 06/28/18 - Increased Lantus a few days ago and added mealtime insulin   - sugars elevated yesterday AM- add 10 U lantus one time - today will increase mealtime insulin- AM glucose was 68 and thus will not add AM Lantus today or increase evening dose    Hypotension with h/o HTN (hypertension) - on Coreg, Maxzide and Losartan at home - Hold Maxzide and Losartan due to AKI, dehydration - holding Coreg as BP running low  Gout - cont Allopurinol & Colchicine      Time spent in minutes: 35 min -   DVT prophylaxis: per vascular surgery Code Status: Full code Family Communication: spoke with his wife Disposition Plan: plan  for CIR, f/u on wound cultures which will be sent again today Consultants:   Vascular surgery Procedures:   I and D Antimicrobials:  Anti-infectives (From admission, onward)   Start     Dose/Rate Route Frequency Ordered Stop   12/23/18 1049  ceFAZolin (ANCEF) 2-4 GM/100ML-% IVPB    Note to Pharmacy: Elige Ko   : cabinet override      12/23/18 1049 12/23/18 2259   12/21/18 1400  [MAR Hold]  ceFAZolin (ANCEF) IVPB 1 g/50 mL premix     (MAR Hold since Tue 12/23/2018 at 0957.Hold Reason: Transfer to a Procedural area.)   1 g 100 mL/hr over 30 Minutes Intravenous Every 8 hours 12/21/18 0959     12/21/18 1200  vancomycin (VANCOCIN) 1,250 mg in sodium chloride 0.9 % 250 mL IVPB  Status:  Discontinued     1,250 mg  166.7 mL/hr over 90 Minutes Intravenous Every 24 hours 12/20/18 1254 12/21/18 0959   12/20/18 1200  vancomycin (VANCOCIN) IVPB 1000 mg/200 mL premix  Status:  Discontinued     1,000 mg 200 mL/hr over 60 Minutes Intravenous Every 24 hours 12/19/18 1205 12/20/18 1254   12/19/18 1700  metroNIDAZOLE (FLAGYL) IVPB 500 mg  Status:  Discontinued     500 mg 100 mL/hr over 60 Minutes Intravenous Every 8 hours 12/19/18 1600 12/21/18 0959   12/19/18 1700  ceFEPIme (MAXIPIME) 2 g in sodium chloride 0.9 % 100 mL IVPB  Status:  Discontinued     2 g 200 mL/hr over 30 Minutes Intravenous Every 12 hours 12/19/18 1543 12/21/18 0959   12/19/18 1100  vancomycin (VANCOCIN) IVPB 1000 mg/200 mL premix  Status:  Discontinued     1,000 mg 200 mL/hr over 60 Minutes Intravenous  Once 12/19/18 1051 12/19/18 1058   12/19/18 1100  cefTRIAXone (ROCEPHIN) 2 g in sodium chloride 0.9 % 100 mL IVPB     2 g 200 mL/hr over 30 Minutes Intravenous  Once 12/19/18 1051 12/19/18 1155   12/19/18 1100  vancomycin (VANCOCIN) 2,000 mg in sodium chloride 0.9 % 500 mL IVPB     2,000 mg 250 mL/hr over 120 Minutes Intravenous  Once 12/19/18 1058 12/19/18 1358       Objective: Vitals:   12/23/18 0500 12/23/18 0506 12/23/18 0830 12/23/18 0831  BP: (!) 84/55 106/65 124/76   Pulse:  86    Resp: 15 16 19    Temp:  100.3 F (37.9 C)  98.3 F (36.8 C)  TempSrc:  Oral  Axillary  SpO2: 92% 97% 98%   Weight: 102.2 kg     Height:        Intake/Output Summary (Last 24 hours) at 12/23/2018 1125 Last data filed at 12/23/2018 0518 Gross per 24 hour  Intake 900 ml  Output 1780 ml  Net -880 ml   Filed Weights   12/19/18 1031 12/22/18 0436 12/23/18 0500  Weight: 108 kg 100.1 kg 102.2 kg    Examination: General exam: Appears comfortable  HEENT: PERRLA, oral mucosa moist, no sclera icterus or thrush Respiratory system: Clear to auscultation. Respiratory effort normal. Cardiovascular system: S1 & S2 heard,  No murmurs   Gastrointestinal system: Abdomen soft, non-tender, nondistended. Normal bowel sounds   Central nervous system: Alert and oriented. No focal neurological deficits. Extremities: No cyanosis, clubbing - edema of left foot noted Skin: No rashes or ulcers Psychiatry:  Mood & affect appropriate. .       Data Reviewed: I have personally reviewed following labs and imaging studies  CBC: Recent Labs  Lab 12/19/18 1031 12/20/18 0421 12/21/18 0407 12/22/18 0435 12/23/18 0800  WBC 10.6* 8.0 9.4 9.2 11.2*  NEUTROABS 7.8*  --   --   --   --   HGB 6.7* 8.6* 8.2* 8.1* 8.3*  HCT 21.8* 26.3* 25.5* 24.9* 25.6*  MCV 73.6* 78.0* 78.9* 79.8* 79.0*  PLT 555* 399 373 387 597   Basic Metabolic Panel: Recent Labs  Lab 12/19/18 1031 12/20/18 0421 12/21/18 0407 12/22/18 0435 12/23/18 0800  NA 128* 129* 132* 133* 132*  K 5.0 5.3* 4.6 4.4 3.9  CL 95* 99 103 103 102  CO2 20* 19* 21* 22 22  GLUCOSE 244* 362* 288* 252* 97  BUN 50* 40* 33* 23 21  CREATININE 2.20* 1.55* 1.44* 1.15 1.00  CALCIUM 8.9 8.1* 8.2* 8.0* 8.1*   GFR: Estimated Creatinine Clearance: 90 mL/min (by C-G formula based on SCr of 1 mg/dL). Liver Function Tests: Recent Labs  Lab 12/19/18 1031  AST 24  ALT 19  ALKPHOS 94  BILITOT 0.8  PROT 7.3  ALBUMIN 1.8*   No results for input(s): LIPASE, AMYLASE in the last 168 hours. No results for input(s): AMMONIA in the last 168 hours. Coagulation Profile: No results for input(s): INR, PROTIME in the last 168 hours. Cardiac Enzymes: No results for input(s): CKTOTAL, CKMB, CKMBINDEX, TROPONINI in the last 168 hours. BNP (last 3 results) No results for input(s): PROBNP in the last 8760 hours. HbA1C: No results for input(s): HGBA1C in the last 72 hours. CBG: Recent Labs  Lab 12/22/18 1715 12/22/18 2118 12/23/18 0608 12/23/18 0950 12/23/18 1028  GLUCAP 281* 342* 189* 68* 86   Lipid Profile: No results for input(s): CHOL, HDL, LDLCALC, TRIG, CHOLHDL, LDLDIRECT in the  last 72 hours. Thyroid Function Tests: No results for input(s): TSH, T4TOTAL, FREET4, T3FREE, THYROIDAB in the last 72 hours. Anemia Panel: No results for input(s): VITAMINB12, FOLATE, FERRITIN, TIBC, IRON, RETICCTPCT in the last 72 hours. Urine analysis:    Component Value Date/Time   COLORURINE YELLOW 12/19/2018 2015   APPEARANCEUR CLEAR 12/19/2018 2015   LABSPEC 1.011 12/19/2018 2015   PHURINE 5.0 12/19/2018 2015   GLUCOSEU NEGATIVE 12/19/2018 2015   HGBUR NEGATIVE 12/19/2018 2015   BILIRUBINUR NEGATIVE 12/19/2018 2015   KETONESUR NEGATIVE 12/19/2018 2015   PROTEINUR NEGATIVE 12/19/2018 2015   UROBILINOGEN 0.2 12/01/2012 2135   NITRITE NEGATIVE 12/19/2018 2015   LEUKOCYTESUR NEGATIVE 12/19/2018 2015   Sepsis Labs: @LABRCNTIP (procalcitonin:4,lacticidven:4) ) Recent Results (from the past 240 hour(s))  Blood Culture (routine x 2)     Status: None (Preliminary result)   Collection Time: 12/19/18 10:36 AM   Specimen: BLOOD  Result Value Ref Range Status   Specimen Description BLOOD BLOOD LEFT HAND  Final   Special Requests   Final    BOTTLES DRAWN AEROBIC AND ANAEROBIC Blood Culture adequate volume   Culture   Final    NO GROWTH 4 DAYS Performed at [facility], [place] [address], [place], [place] [address]    Report Status PENDING  Incomplete  Blood Culture (routine x 2)     Status: None (Preliminary result)   Collection Time: 12/19/18 10:48 AM   Specimen: BLOOD  Result Value Ref Range Status   Specimen Description BLOOD RIGHT ANTECUBITAL  Final   Special Requests   Final    BOTTLES DRAWN AEROBIC AND ANAEROBIC Blood Culture adequate volume   Culture   Final    NO GROWTH 4 DAYS Performed at [facility]  Lab, [address] [place], [place] [address]    Report Status PENDING  Incomplete  SARS Coronavirus 2 (CEPHEID - Performed in [place] hospital lab), Hosp Order     Status: None   Collection Time: 12/19/18 10:56 AM   Specimen: Nasopharyngeal Swab  Result  Value Ref Range Status   SARS Coronavirus 2 NEGATIVE NEGATIVE Final    Comment: (NOTE) If result is NEGATIVE SARS-CoV-2 target nucleic acids are NOT DETECTED. The SARS-CoV-2 RNA is generally detectable in upper and lower  respiratory specimens during the acute phase of infection. The lowest  concentration of SARS-CoV-2 viral copies this assay can detect is 250  copies / mL. A negative result does not preclude SARS-CoV-2 infection  and should not be used as the sole basis for treatment or other  patient management decisions.  A negative result may occur with  improper specimen collection / handling, submission of specimen other  than nasopharyngeal swab, presence of viral mutation(s) within the  areas targeted by this assay, and inadequate number of viral copies  (<250 copies / mL). A negative result must be combined with clinical  observations, patient history, and epidemiological information. If result is POSITIVE SARS-CoV-2 target nucleic acids are DETECTED. The SARS-CoV-2 RNA is generally detectable in upper and lower  respiratory specimens dur ing the acute phase of infection.  Positive  results are indicative of active infection with SARS-CoV-2.  Clinical  correlation with patient history and other diagnostic information is  necessary to determine patient infection status.  Positive results do  not rule out bacterial infection or co-infection with other viruses. If result is PRESUMPTIVE POSTIVE SARS-CoV-2 nucleic acids MAY BE PRESENT.   A presumptive positive result was obtained on the submitted specimen  and confirmed on repeat testing.  While 2019 novel coronavirus  (SARS-CoV-2) nucleic acids may be present in the submitted sample  additional confirmatory testing may be necessary for epidemiological  and / or clinical management purposes  to differentiate between  SARS-CoV-2 and other Sarbecovirus currently known to infect humans.  If clinically indicated additional testing  with an alternate test  methodology [phone]) is advised. The SARS-CoV-2 RNA is generally  detectable in upper and lower respiratory sp ecimens during the acute  phase of infection. The expected result is Negative. Fact Sheet for Patients:  StrictlyIdeas.no Fact Sheet for Healthcare Providers: BankingDealers.co.za This test is not yet approved or cleared by the [place] FDA and has been authorized for detection and/or diagnosis of SARS-CoV-2 by FDA under an Emergency Use Authorization (EUA).  This EUA will remain in effect (meaning this test can be used) for the duration of the COVID-19 declaration under Section 564(b)(1) of the Act, 21 U.S.C. section 360bbb-3(b)(1), unless the authorization is terminated or revoked sooner. Performed at [facility], [place] [address] [place], [place] [address]   Aerobic/Anaerobic Culture (surgical/deep wound)     Status: None (Preliminary result)   Collection Time: 12/19/18  2:38 PM   Specimen: Wound  Result Value Ref Range Status   Specimen Description WOUND LEFT LOWER LEG  Final   Special Requests PATIENT ON FOLLOWING VANC AND ROCEPHIN  Final   Gram Stain   Final    ABUNDANT WBC PRESENT,BOTH PMN AND MONONUCLEAR MODERATE GRAM POSITIVE COCCI Performed at [facility], [place] [address], [place], [place] [address]    Culture   Final    ABUNDANT GROUP B STREP(S.AGALACTIAE)ISOLATED TESTING AGAINST S. AGALACTIAE NOT ROUTINELY PERFORMED DUE TO PREDICTABILITY OF AMP/PEN/VAN  SUSCEPTIBILITY. NO ANAEROBES ISOLATED; CULTURE IN PROGRESS FOR 5 DAYS    Report Status PENDING  Incomplete  Surgical pcr screen     Status: Abnormal   Collection Time: 12/21/18 12:04 AM   Specimen: Nasal Mucosa; Nasal Swab  Result Value Ref Range Status   MRSA, PCR POSITIVE (A) NEGATIVE Final    Comment: RESULT CALLED TO, READ BACK BY AND VERIFIED WITH: RN T IRBY @0207  12/21/18 BY S GEZAHEGN    Staphylococcus aureus  POSITIVE (A) NEGATIVE Final    Comment: (NOTE) The Xpert SA Assay (FDA approved for NASAL specimens in patients 70 years of age and older), is one component of a comprehensive surveillance program. It is not intended to diagnose infection nor to guide or monitor treatment. Performed at [facility], [place] [address] [place], [place] [address]          Radiology Studies: No results found.    Scheduled Meds: . [MAR Hold] bisacodyl  10 mg Rectal Once  . [MAR Hold] colchicine  0.6 mg Oral BID  . [MAR Hold] dorzolamide-timolol  1 drop Both Eyes BID  . [MAR Hold] ferrous sulfate  325 mg Oral BID WC  . [MAR Hold] insulin aspart  0-15 Units Subcutaneous TID WC  . [MAR Hold] insulin aspart  0-5 Units Subcutaneous QHS  . [MAR Hold] insulin aspart  5 Units Subcutaneous TID WC  . [MAR Hold] insulin glargine  26 Units Subcutaneous QHS  . [MAR Hold] latanoprost  1 drop Both Eyes QHS  . [MAR Hold] mupirocin ointment  1 application Nasal BID  . [MAR Hold] polyethylene glycol  17 g Oral Daily  . [MAR Hold] pravastatin  40 mg Oral QHS  . [MAR Hold] pregabalin  50 mg Oral QHS  . [MAR Hold] sodium phosphate  1 enema Rectal Once   Continuous Infusions: . [MAR Hold] sodium chloride Stopped (12/20/18 0010)  . ceFAZolin    . [MAR Hold]  ceFAZolin (ANCEF) IV 1 g (12/23/18 0518)  . lactated ringers 10 mL/hr at 12/23/18 1009     LOS: 4 days      Debbe Odea, MD [facility] Pager: [email or web address] Password [email or web address] 12/23/2018, 11:25 AM

## 2018-12-23 NOTE — Progress Notes (Signed)
Hypoglycemic Event  CBG: 68  Treatment: D50 25 mL (12.5 gm)  Symptoms: None  Follow-up CBG: Time:1028 CBG Result: 86  Possible Reasons for Event: NPO, Insulin administration while NPO  Comments/MD notified: none    Kreg Shropshire

## 2018-12-23 NOTE — Progress Notes (Signed)
Pt 's HR 110-130, BP 172/85- 163/98 mmHg. Oral temp 98.2-98.7 Farenheit, rectal temp 101.5 Farenheit. He was shivering and feeling cold, alert and awake, oriented x 4. Notified Dr.K. Baltazar Najjar, on-call provider, order received.Tylenol 650 mg one time dose given, sponge bath given. His blood culture and wound culture have been pending and no growth after 3 days. Pt's already on ATB Cefazolin 1 g q 8 hr.              Dressing changed right lower leg, the wound on upper one with opened [place] drain dressing was soaking with purolent drainage wound was red and peri wound swollen with severe pain scale 8/10. Pt was able to rest after Tylenol and Percocet  given.  Continue to monitor.  Kennyth Lose, RN

## 2018-12-23 NOTE — Progress Notes (Signed)
Per Sharyn Lull rn/ received report, staes soes not need telem for tansport when asked

## 2018-12-23 NOTE — Anesthesia Procedure Notes (Signed)
Procedure Name: LMA Insertion Date/Time: 12/23/2018 11:06 AM Performed by: Neldon [facility], CRNA Pre-anesthesia Checklist: Timeout performed, Patient being monitored, Suction available, Emergency Drugs available and Patient identified Patient Re-evaluated:Patient Re-evaluated prior to induction Oxygen Delivery Method: Circle system utilized Preoxygenation: Pre-oxygenation with 100% oxygen Induction Type: IV induction Ventilation: Mask ventilation without difficulty LMA Size: 4.0 Tube type: Oral Number of attempts: 1 Placement Confirmation: breath sounds checked- equal and bilateral and positive ETCO2 Tube secured with: Tape Dental Injury: Teeth and Oropharynx as per pre-operative assessment

## 2018-12-23 NOTE — Op Note (Signed)
    Patient name: [name] MRN: [id number] DOB: April 23, 1951 Sex: male  12/23/2018 Pre-operative Diagnosis: Left lower extremity infection status post debridement Post-operative diagnosis:  Same Surgeon:  Eda Paschal. Donzetta Matters, MD Procedure Performed: Washout of left leg wounds  Indications: 68 year old male has undergone left SFA to AT bypass which is patent.  He developed a late infection behind his knee is been draining purulence is now indicated for further washout.  Findings: There was purulence in the most cephalad lower leg wound this was sent for culture.  I opened his above-knee wound could not find any new pus pockets.  We thoroughly irrigated using pulse evacuation at completion all the tissue looked healthy and was packed with wet-to-dry dressings.   Procedure:  The patient was identified in the holding area and taken to the operating room where is placed upon operative when general anesthesia induced.  Sterilely prepped draped left lower extremity usual fashion antibiotics were administered time was called.  I began by removing his Penrose drain that was in place.  I then open the above-knee incision where this infection had initially originated.  I connected the above-knee and below-knee incisions protecting the bypass.  We then thoroughly irrigated all 3 wounds.  The lower 2 wounds did not communicate I did not force the communication there.  I probed with my finger as far as I could in all directions could not find any further pus pockets.  This was washed out with pulse evacuation to remove all hematoma and purulence.  Wounds looked healthy.  I passed a Penrose from the above the below-knee incisions and sutured these 2 ends together.  Wet-to-dry dressings were then placed.  He is awake and anesthesia having tolerated procedure without any complication.  All counts were correct at completion.  EBL: 20 cc    Latrish Mogel C. Donzetta Matters, MD [facility] Office:  [phone] Pager: [phone]

## 2018-12-23 NOTE — Anesthesia Preprocedure Evaluation (Addendum)
Anesthesia Evaluation  Patient identified by MRN, date of birth, ID band Patient awake    Reviewed: Allergy & Precautions, H&P , NPO status , Patient's Chart, lab work & pertinent test results  Airway Mallampati: I  TM Distance: >3 FB Neck ROM: full    Dental  (+)    Pulmonary sleep apnea and Continuous Positive Airway Pressure Ventilation , former smoker,    Pulmonary exam normal        Cardiovascular hypertension, Pt. on medications Normal cardiovascular exam     Neuro/Psych  Neuromuscular disease    GI/Hepatic   Endo/Other  diabetes, Type 2, Oral Hypoglycemic Agents  Renal/GU CRFRenal disease     Musculoskeletal   Abdominal   Peds  Hematology  (+) Blood dyscrasia, anemia ,   Anesthesia Other Findings   Reproductive/Obstetrics                            Anesthesia Physical Anesthesia Plan  ASA: III  Anesthesia Plan: General   Post-op Pain Management:    Induction: Intravenous  PONV Risk Score and Plan: 2  Airway Management Planned: LMA  Additional Equipment:   Intra-op Plan:   Post-operative Plan: Extubation in OR  Informed Consent: I have reviewed the patients History and Physical, chart, labs and discussed the procedure including the risks, benefits and alternatives for the proposed anesthesia with the patient or authorized representative who has indicated his/her understanding and acceptance.     Dental Advisory Given  Plan Discussed with: CRNA and Surgeon  Anesthesia Plan Comments:        Anesthesia Quick Evaluation

## 2018-12-23 NOTE — Progress Notes (Deleted)
add'l liter LR in pacu, 123/49  (MAP 68)

## 2018-12-23 NOTE — Progress Notes (Signed)
OT Cancellation Note  Patient Details Name: [name] MRN: [id number] DOB: September 11, 1950   Cancelled Treatment:    Reason Eval/Treat Not Completed: Patient at procedure or test/ unavailable. Pt currently in surgery.  [place], OTR/L Acute Rehab Services Pager [phone] Office [phone]     Almon Register 12/23/2018, 10:03 AM

## 2018-12-23 NOTE — Transfer of Care (Signed)
Immediate Anesthesia Transfer of Care Note  Patient: [name]  Procedure(s) Performed: IRRIGATION AND DEBRIDEMENT EXTREMITY WOUND (Left Leg Lower)  Patient Location: PACU  Anesthesia Type:General  Level of Consciousness: sedated  Airway & Oxygen Therapy: Patient Spontanous Breathing and Patient connected to nasal cannula oxygen  Post-op Assessment: Report given to RN, Post -op Vital signs reviewed and stable and Patient moving all extremities X 4  Post vital signs: Reviewed and stable  Last Vitals:  Vitals Value Taken Time  BP 146/87 12/23/18 1206  Temp    Pulse 69 12/23/18 1207  Resp 10 12/23/18 1207  SpO2 100 % 12/23/18 1207  Vitals shown include unvalidated device data.  Last Pain:  Vitals:   12/23/18 0831  TempSrc: Axillary  PainSc:          Complications: No apparent anesthesia complications

## 2018-12-23 NOTE — Progress Notes (Signed)
  Progress Note    12/23/2018 10:42 AM Day of Surgery  Subjective: No overnight issues  Vitals:   12/23/18 0830 12/23/18 0831  BP: 124/76   Pulse:    Resp: 19   Temp:  98.3 F (36.8 C)  SpO2: 98%     Physical Exam: Awake alert oriented Left leg dressing clean dry intact  CBC    Component Value Date/Time   WBC 11.2 (H) 12/23/2018 0800   RBC 3.24 (L) 12/23/2018 0800   HGB 8.3 (L) 12/23/2018 0800   HCT 25.6 (L) 12/23/2018 0800   PLT 344 12/23/2018 0800   MCV 79.0 (L) 12/23/2018 0800   MCH 25.6 (L) 12/23/2018 0800   MCHC 32.4 12/23/2018 0800   RDW 22.4 (H) 12/23/2018 0800   LYMPHSABS 1.6 12/19/2018 1031   MONOABS 1.0 12/19/2018 1031   EOSABS 0.0 12/19/2018 1031   BASOSABS 0.0 12/19/2018 1031    BMET    Component Value Date/Time   NA 132 (L) 12/23/2018 0800   K 3.9 12/23/2018 0800   CL 102 12/23/2018 0800   CO2 22 12/23/2018 0800   GLUCOSE 97 12/23/2018 0800   BUN 21 12/23/2018 0800   CREATININE 1.00 12/23/2018 0800   CALCIUM 8.1 (L) 12/23/2018 0800   GFRNONAA >60 12/23/2018 0800   GFRAA >60 12/23/2018 0800    INR    Component Value Date/Time   INR 0.93 12/01/2012 1908     Intake/Output Summary (Last 24 hours) at 12/23/2018 1042 Last data filed at 12/23/2018 0518 Gross per 24 hour  Intake 900 ml  Output 1780 ml  Net -880 ml     Assessment/plan:  68 y.o. male is s/p washout of infected hematoma left leg status post bypass in January.  Has persistent purulent drainage from the most cephalad incision.  Plan to probably open his thigh today to get full control.  I have discussed this with the patient he demonstrates good understanding    Atwell Mcdanel C. Donzetta Matters, MD [facility] Office: [phone] Pager: [phone]  12/23/2018 10:42 AM

## 2018-12-23 NOTE — Anesthesia Postprocedure Evaluation (Signed)
Anesthesia Post Note  Patient: [name]  Procedure(s) Performed: IRRIGATION AND DEBRIDEMENT EXTREMITY WOUND (Left Leg Lower)     Patient location during evaluation: PACU Anesthesia Type: General Level of consciousness: awake and alert Pain management: pain level controlled Vital Signs Assessment: post-procedure vital signs reviewed and stable Respiratory status: spontaneous breathing, nonlabored ventilation, respiratory function stable and patient connected to nasal cannula oxygen Cardiovascular status: blood pressure returned to baseline and stable Postop Assessment: no apparent nausea or vomiting Anesthetic complications: no    Last Vitals:  Vitals:   12/23/18 1235 12/23/18 1236  BP: 137/88   Pulse:    Resp: 13   Temp:    SpO2: 100% 100%    Last Pain:  Vitals:   12/23/18 1236  TempSrc:   PainSc: Asleep                 Ariella Voit DAVID

## 2018-12-24 ENCOUNTER — Encounter (HOSPITAL_COMMUNITY): Payer: Self-pay | Admitting: Vascular Surgery

## 2018-12-24 LAB — BASIC METABOLIC PANEL
Anion gap: 12 (ref 5–15)
BUN: 20 mg/dL (ref 8–23)
CO2: 22 mmol/L (ref 22–32)
Calcium: 8.1 mg/dL — ABNORMAL LOW (ref 8.9–10.3)
Chloride: 99 mmol/L (ref 98–111)
Creatinine, Ser: 1.13 mg/dL (ref 0.61–1.24)
GFR calc Af Amer: >60 mL/min (ref >60)
GFR calc non Af Amer: >60 mL/min (ref >60)
Glucose, Bld: 291 mg/dL — ABNORMAL HIGH (ref 70–99)
Potassium: 4.6 mmol/L (ref 3.5–5.1)
Sodium: 133 mmol/L — ABNORMAL LOW (ref 135–145)

## 2018-12-24 LAB — CULTURE, BLOOD (ROUTINE X 2)
Culture: NO GROWTH
Culture: NO GROWTH
Special Requests: ADEQUATE
Special Requests: ADEQUATE

## 2018-12-24 LAB — GLUCOSE, CAPILLARY
Glucose-Capillary: 299 mg/dL — ABNORMAL HIGH (ref 70–99)
Glucose-Capillary: 321 mg/dL — ABNORMAL HIGH (ref 70–99)
Glucose-Capillary: 239 mg/dL — ABNORMAL HIGH (ref 70–99)
Glucose-Capillary: 240 mg/dL — ABNORMAL HIGH (ref 70–99)

## 2018-12-24 LAB — CBC
HCT: 23 % — ABNORMAL LOW (ref 39.0–52.0)
Hemoglobin: 7.5 g/dL — ABNORMAL LOW (ref 13.0–17.0)
MCH: 25.9 pg — ABNORMAL LOW (ref 26.0–34.0)
MCHC: 32.6 g/dL (ref 30.0–36.0)
MCV: 79.3 fL — ABNORMAL LOW (ref 80.0–100.0)
Platelets: 309 10*3/uL (ref 150–400)
RBC: 2.9 MIL/uL — ABNORMAL LOW (ref 4.22–5.81)
RDW: 22.3 % — ABNORMAL HIGH (ref 11.5–15.5)
WBC: 11.8 10*3/uL — ABNORMAL HIGH (ref 4.0–10.5)
nRBC: 0 % (ref 0.0–0.2)

## 2018-12-24 MED ORDER — BISACODYL 10 MG RE SUPP
10.0000 mg | Freq: Every day | RECTAL | Status: DC | PRN
  Administered 2018-12-24: 10 mg via RECTAL

## 2018-12-24 MED ORDER — INSULIN GLARGINE 100 UNIT/ML ~~LOC~~ SOLN
30.0000 [IU] | Freq: Every day | SUBCUTANEOUS | Status: DC
  Administered 2018-12-24 – 2018-12-25 (×2): 30 [IU] via SUBCUTANEOUS
  Filled 2018-12-24 (×4): qty 0.3

## 2018-12-24 MED ORDER — BISACODYL 5 MG PO TBEC
5.0000 mg | DELAYED_RELEASE_TABLET | Freq: Two times a day (BID) | ORAL | Status: DC | PRN
  Administered 2018-12-24: 5 mg via ORAL
  Filled 2018-12-24: qty 1

## 2018-12-24 MED ORDER — HEPARIN SODIUM (PORCINE) 5000 UNIT/ML IJ SOLN
5000.0000 [IU] | Freq: Three times a day (TID) | INTRAMUSCULAR | Status: DC
  Administered 2018-12-24 – 2018-12-26 (×7): 5000 [IU] via SUBCUTANEOUS
  Filled 2018-12-24 (×7): qty 1

## 2018-12-24 NOTE — Progress Notes (Addendum)
PROGRESS NOTE    [name]   NGE:[id number]  DOB: May 05, 1951  DOA: 12/19/2018 PCP: [name], MD   Brief Narrative:  [name]  is an 68 y.o. male with past medical history significant for IDDM on insulin pump diagnosed 2005, CKD 3, hypertension, severe peripheral vascular disease on Xarelto status post left leg stenting with stent stenosis, s/p subsequent left femoral anterior tibial bypass and multiple left toe amputations, who developed a wound on his left leg near his ankle that he has been taking care of at home. He states that about 2- 3 wks ago a separate swelling was noted behind his knee which eventually increased in size, and began to drain blood and pus. He was sent from the vascular surgery office. Noted to have BP 79/52, Hb of 6.7, sodium 128, Bun 50, Cr 2.20, Lactic acid 3.2.  Subjective: He is constipated. No other complaints.   Assessment & Plan:   Severe sepsis 2/2 wound on left leg Severe PVD of left leg -his leg wounds have been managed as outpt by vascular surgery -s/p I and D on 6/26; 6/28/ and 7/2 by vascular surgery with noted tunneling of abscess into calf - Blood cultures NGTD - wound cultures- moderate gr + cocci on gram stain- results show abundant group B strep  - cont Ancef  - vascular surgery to decide on when Xarelto can be resumed- it was started in the fall by Dr [name] for severe PVD - PT recommends CIR- have consulted them- patient in agreement  Acute blood loss anemia superimposed on anemia of chronic disease -Hb 7.5 currently - somewhat downtrending but patient had repeat I/D this morning -Baseline appears to be 8.2- he has had bleeding form his leg wound which may be the cause -s/p transfusion of 1 U PRBC on 6/26 -microcytosis noted - Iron saturation is low normal and he could benefit from Iron replacement orally which has been started  Acute on chronic renal failure - CKD3, resolved Concurrent Hyponatremia -due to dehydration in  setting of HCTZ and poor oral intake at home-   hold HCTZ -Cr 2.2 on admit - currently 1.1 - baseline Cr ~ 1.2-1.7   IDDM (insulin dependent diabetes mellitus)  - on insulin pump at home- cont Lantus and SSI for now  - last A1c 6.5 in 06/28/18 - Increased Lantus a few days ago and added mealtime insulin   -Glucose remains elevated today - requiring 35+ units of sliding scale -Increase lantus to 30u BID from 26 - continue current meal time regimen  Hypotension with h/o HTN (hypertension) - on Coreg, Maxzide and Losartan at home - Hold Maxzide and Losartan due to AKI, dehydration - holding Coreg as BP running low  Gout, no acute issues - cont Allopurinol & Colchicine     Time spent in minutes: 35 min DVT prophylaxis: per vascular surgery Code Status: Full code Family Communication: spoke with his wife Disposition Plan: plan for CIR due to remarkable need for ongoing physical therapy, close monitoring, f/u on wound cultures/infection which will be sent again today Consultants:   Vascular surgery Procedures:   I and D x3 Antimicrobials:  Anti-infectives (From admission, onward)   Start     Dose/Rate Route Frequency Ordered Stop   12/23/18 1049  ceFAZolin (ANCEF) 2-4 GM/100ML-% IVPB    Note to Pharmacy: Elige Ko   : cabinet override      12/23/18 1049 12/23/18 2259   12/21/18 1400  ceFAZolin (ANCEF) IVPB 1  g/50 mL premix     1 g 100 mL/hr over 30 Minutes Intravenous Every 8 hours 12/21/18 0959     12/21/18 1200  vancomycin (VANCOCIN) 1,250 mg in sodium chloride 0.9 % 250 mL IVPB  Status:  Discontinued     1,250 mg 166.7 mL/hr over 90 Minutes Intravenous Every 24 hours 12/20/18 1254 12/21/18 0959   12/20/18 1200  vancomycin (VANCOCIN) IVPB 1000 mg/200 mL premix  Status:  Discontinued     1,000 mg 200 mL/hr over 60 Minutes Intravenous Every 24 hours 12/19/18 1205 12/20/18 1254   12/19/18 1700  metroNIDAZOLE (FLAGYL) IVPB 500 mg  Status:  Discontinued     500 mg 100 mL/hr  over 60 Minutes Intravenous Every 8 hours 12/19/18 1600 12/21/18 0959   12/19/18 1700  ceFEPIme (MAXIPIME) 2 g in sodium chloride 0.9 % 100 mL IVPB  Status:  Discontinued     2 g 200 mL/hr over 30 Minutes Intravenous Every 12 hours 12/19/18 1543 12/21/18 0959   12/19/18 1100  vancomycin (VANCOCIN) IVPB 1000 mg/200 mL premix  Status:  Discontinued     1,000 mg 200 mL/hr over 60 Minutes Intravenous  Once 12/19/18 1051 12/19/18 1058   12/19/18 1100  cefTRIAXone (ROCEPHIN) 2 g in sodium chloride 0.9 % 100 mL IVPB     2 g 200 mL/hr over 30 Minutes Intravenous  Once 12/19/18 1051 12/19/18 1155   12/19/18 1100  vancomycin (VANCOCIN) 2,000 mg in sodium chloride 0.9 % 500 mL IVPB     2,000 mg 250 mL/hr over 120 Minutes Intravenous  Once 12/19/18 1058 12/19/18 1358     Objective: Vitals:   12/24/18 0000 12/24/18 0100 12/24/18 0500 12/24/18 0831  BP: 114/77 (!) 117/92  121/71  Pulse:    63  Resp: 12 12  19   Temp:  97.9 F (36.6 C)  98.7 F (37.1 C)  TempSrc:  Oral  Oral  SpO2: 96% 97%  100%  Weight:   102.2 kg   Height:        Intake/Output Summary (Last 24 hours) at 12/24/2018 1529 Last data filed at 12/24/2018 1210 Gross per 24 hour  Intake 418.5 ml  Output 1640 ml  Net -1221.5 ml   Filed Weights   12/22/18 0436 12/23/18 0500 12/24/18 0500  Weight: 100.1 kg 102.2 kg 102.2 kg    Examination: General exam: Appears comfortable  HEENT: PERRLA, oral mucosa moist, no sclera icterus or thrush Respiratory system: Clear to auscultation. Respiratory effort normal. Cardiovascular system: S1 & S2 heard,  No murmurs  Gastrointestinal system: Abdomen soft, non-tender, nondistended. Normal bowel sounds   Central nervous system: Alert and oriented. No focal neurological deficits. Extremities: No cyanosis, clubbing - edema of left foot noted Skin: No rashes or ulcers Psychiatry:  Mood & affect appropriate. .    Data Reviewed: I have personally reviewed following labs and imaging studies   CBC: Recent Labs  Lab 12/19/18 1031 12/20/18 0421 12/21/18 0407 12/22/18 0435 12/23/18 0800 12/24/18 0450  WBC 10.6* 8.0 9.4 9.2 11.2* 11.8*  NEUTROABS 7.8*  --   --   --   --   --   HGB 6.7* 8.6* 8.2* 8.1* 8.3* 7.5*  HCT 21.8* 26.3* 25.5* 24.9* 25.6* 23.0*  MCV 73.6* 78.0* 78.9* 79.8* 79.0* 79.3*  PLT 555* 399 373 387 344 588   Basic Metabolic Panel: Recent Labs  Lab 12/20/18 0421 12/21/18 0407 12/22/18 0435 12/23/18 0800 12/24/18 0450  NA 129* 132* 133* 132* 133*  K 5.3*  4.6 4.4 3.9 4.6  CL 99 103 103 102 99  CO2 19* 21* 22 22 22   GLUCOSE 362* 288* 252* 97 291*  BUN 40* 33* 23 21 20   CREATININE 1.55* 1.44* 1.15 1.00 1.13  CALCIUM 8.1* 8.2* 8.0* 8.1* 8.1*   GFR: Estimated Creatinine Clearance: 79.7 mL/min (by C-G formula based on SCr of 1.13 mg/dL). Liver Function Tests: Recent Labs  Lab 12/19/18 1031  AST 24  ALT 19  ALKPHOS 94  BILITOT 0.8  PROT 7.3  ALBUMIN 1.8*   No results for input(s): LIPASE, AMYLASE in the last 168 hours. No results for input(s): AMMONIA in the last 168 hours. Coagulation Profile: No results for input(s): INR, PROTIME in the last 168 hours. Cardiac Enzymes: No results for input(s): CKTOTAL, CKMB, CKMBINDEX, TROPONINI in the last 168 hours. BNP (last 3 results) No results for input(s): PROBNP in the last 8760 hours. HbA1C: No results for input(s): HGBA1C in the last 72 hours. CBG: Recent Labs  Lab 12/23/18 1218 12/23/18 1654 12/23/18 2209 12/24/18 0645 12/24/18 1316  GLUCAP 91 300* 346* 299* 321*   Lipid Profile: No results for input(s): CHOL, HDL, LDLCALC, TRIG, CHOLHDL, LDLDIRECT in the last 72 hours. Thyroid Function Tests: No results for input(s): TSH, T4TOTAL, FREET4, T3FREE, THYROIDAB in the last 72 hours. Anemia Panel: No results for input(s): VITAMINB12, FOLATE, FERRITIN, TIBC, IRON, RETICCTPCT in the last 72 hours. Urine analysis:    Component Value Date/Time   COLORURINE YELLOW 12/19/2018 2015    APPEARANCEUR CLEAR 12/19/2018 2015   LABSPEC 1.011 12/19/2018 2015   PHURINE 5.0 12/19/2018 2015   GLUCOSEU NEGATIVE 12/19/2018 2015   HGBUR NEGATIVE 12/19/2018 2015   BILIRUBINUR NEGATIVE 12/19/2018 2015   KETONESUR NEGATIVE 12/19/2018 2015   PROTEINUR NEGATIVE 12/19/2018 2015   UROBILINOGEN 0.2 12/01/2012 2135   NITRITE NEGATIVE 12/19/2018 2015   LEUKOCYTESUR NEGATIVE 12/19/2018 2015   Recent Results (from the past 240 hour(s))  Blood Culture (routine x 2)     Status: None   Collection Time: 12/19/18 10:36 AM   Specimen: BLOOD  Result Value Ref Range Status   Specimen Description BLOOD BLOOD LEFT HAND  Final   Special Requests   Final    BOTTLES DRAWN AEROBIC AND ANAEROBIC Blood Culture adequate volume   Culture   Final    NO GROWTH 5 DAYS Performed at [facility], [place] [address], [place], [place] [address]    Report Status 12/24/2018 FINAL  Final  Blood Culture (routine x 2)     Status: None   Collection Time: 12/19/18 10:48 AM   Specimen: BLOOD  Result Value Ref Range Status   Specimen Description BLOOD RIGHT ANTECUBITAL  Final   Special Requests   Final    BOTTLES DRAWN AEROBIC AND ANAEROBIC Blood Culture adequate volume   Culture   Final    NO GROWTH 5 DAYS Performed at [facility], [place] [address] [place], [place] [address]    Report Status 12/24/2018 FINAL  Final  SARS Coronavirus 2 (CEPHEID - Performed in [facility]), Hosp Order     Status: None   Collection Time: 12/19/18 10:56 AM   Specimen: Nasopharyngeal Swab  Result Value Ref Range Status   SARS Coronavirus 2 NEGATIVE NEGATIVE Final    Comment: (NOTE) If result is NEGATIVE SARS-CoV-2 target nucleic acids are NOT DETECTED. The SARS-CoV-2 RNA is generally detectable in upper and lower  respiratory specimens during the acute phase of infection. The lowest  concentration of SARS-CoV-2 viral copies this assay can detect is 250  copies / mL. A negative result does not preclude  SARS-CoV-2 infection  and should not be used as the sole basis for treatment or other  patient management decisions.  A negative result may occur with  improper specimen collection / handling, submission of specimen other  than nasopharyngeal swab, presence of viral mutation(s) within the  areas targeted by this assay, and inadequate number of viral copies  (<250 copies / mL). A negative result must be combined with clinical  observations, patient history, and epidemiological information. If result is POSITIVE SARS-CoV-2 target nucleic acids are DETECTED. The SARS-CoV-2 RNA is generally detectable in upper and lower  respiratory specimens dur ing the acute phase of infection.  Positive  results are indicative of active infection with SARS-CoV-2.  Clinical  correlation with patient history and other diagnostic information is  necessary to determine patient infection status.  Positive results do  not rule out bacterial infection or co-infection with other viruses. If result is PRESUMPTIVE POSTIVE SARS-CoV-2 nucleic acids MAY BE PRESENT.   A presumptive positive result was obtained on the submitted specimen  and confirmed on repeat testing.  While 2019 novel coronavirus  (SARS-CoV-2) nucleic acids may be present in the submitted sample  additional confirmatory testing may be necessary for epidemiological  and / or clinical management purposes  to differentiate between  SARS-CoV-2 and other Sarbecovirus currently known to infect humans.  If clinically indicated additional testing with an alternate test  methodology [phone]) is advised. The SARS-CoV-2 RNA is generally  detectable in upper and lower respiratory sp ecimens during the acute  phase of infection. The expected result is Negative. Fact Sheet for Patients:  StrictlyIdeas.no Fact Sheet for Healthcare Providers: BankingDealers.co.za This test is not yet approved or cleared by the  [place] FDA and has been authorized for detection and/or diagnosis of SARS-CoV-2 by FDA under an Emergency Use Authorization (EUA).  This EUA will remain in effect (meaning this test can be used) for the duration of the COVID-19 declaration under Section 564(b)(1) of the Act, 21 U.S.C. section 360bbb-3(b)(1), unless the authorization is terminated or revoked sooner. Performed at [facility], [place] [address] [place], [place] [address]   Aerobic/Anaerobic Culture (surgical/deep wound)     Status: None (Preliminary result)   Collection Time: 12/19/18  2:38 PM   Specimen: Wound  Result Value Ref Range Status   Specimen Description WOUND LEFT LOWER LEG  Final   Special Requests PATIENT ON FOLLOWING VANC AND ROCEPHIN  Final   Gram Stain   Final    ABUNDANT WBC PRESENT,BOTH PMN AND MONONUCLEAR MODERATE GRAM POSITIVE COCCI Performed at [facility], [place] [address] [place], [place] [address]    Culture   Final    ABUNDANT GROUP B STREP(S.AGALACTIAE)ISOLATED TESTING AGAINST S. AGALACTIAE NOT ROUTINELY PERFORMED DUE TO PREDICTABILITY OF AMP/PEN/VAN SUSCEPTIBILITY. NO ANAEROBES ISOLATED; CULTURE IN PROGRESS FOR 5 DAYS    Report Status PENDING  Incomplete  Surgical pcr screen     Status: Abnormal   Collection Time: 12/21/18 12:04 AM   Specimen: Nasal Mucosa; Nasal Swab  Result Value Ref Range Status   MRSA, PCR POSITIVE (A) NEGATIVE Final    Comment: RESULT CALLED TO, READ BACK BY AND VERIFIED WITH: RN T IRBY @0207  12/21/18 BY S GEZAHEGN    Staphylococcus aureus POSITIVE (A) NEGATIVE Final    Comment: (NOTE) The Xpert SA Assay (FDA approved for NASAL specimens  in patients 81 years of age and older), is one component of a comprehensive surveillance program. It is not intended to diagnose infection nor to guide or monitor treatment. Performed at [facility], [place] [address] [place], [place] [address]   Aerobic/Anaerobic Culture (surgical/deep wound)     Status:  None (Preliminary result)   Collection Time: 12/23/18 11:40 AM   Specimen: Wound  Result Value Ref Range Status   Specimen Description WOUND  Final   Special Requests LEFT LEG  Final   Gram Stain   Final    FEW WBC PRESENT, PREDOMINANTLY PMN NO ORGANISMS SEEN    Culture   Final    NO GROWTH < 24 HOURS Performed at [place] [address], [place], [place] [address]    Report Status PENDING  Incomplete    Radiology Studies: No results found.  Scheduled Meds: . bisacodyl  10 mg Rectal Once  . colchicine  0.6 mg Oral BID  . dorzolamide-timolol  1 drop Both Eyes BID  . ferrous sulfate  325 mg Oral BID WC  . heparin injection (subcutaneous)  5,000 Units Subcutaneous Q8H  . insulin aspart  0-15 Units Subcutaneous TID WC  . insulin aspart  0-5 Units Subcutaneous QHS  . insulin aspart  5 Units Subcutaneous TID WC  . insulin glargine  26 Units Subcutaneous QHS  . latanoprost  1 drop Both Eyes QHS  . mupirocin ointment  1 application Nasal BID  . polyethylene glycol  17 g Oral Daily  . pravastatin  40 mg Oral QHS  . pregabalin  50 mg Oral QHS   Continuous Infusions: . sodium chloride Stopped (12/20/18 0010)  .  ceFAZolin (ANCEF) IV 1 g (12/24/18 1442)  . lactated ringers 10 mL/hr at 12/23/18 1009     LOS: 5 days   Little Ishikawa, MD [facility] Pager: [email or web address] Password [facility] 12/24/2018, 3:29 PM

## 2018-12-24 NOTE — Plan of Care (Signed)

## 2018-12-24 NOTE — Evaluation (Signed)
Occupational Therapy Evaluation Patient Details Name: [name] MRN: [id number] DOB: [birth year] Today's Date: 12/24/2018    History of Present Illness [name]  is an 68 y.o. male with past medical history significant for IDDM on insulin pump diagnosed 2005, CKD 3, hypertension, severe peripheral vascular disease on Xarelto status post left leg stenting with stent stenosis, s/p subsequent left femoral anterior tibial bypass and multiple left toe amputations, who developed a wound on his left leg near his ankle that he has been taking care of at home. He states that about 2- 3 wks ago a separate swelling was noted behind his knee which eventually increased in size, and began to drain blood and pus. He was sent from the vascular surgery office. He underwent I and D of his calf wound/abscess by vascular surgery after being admitted. 12/21/18 Washout of left leg wounds.   Clinical Impression   Pt admitted with the above diagnoses and presents with below problem list. Pt will benefit from continued acute OT to address the below listed deficits and maximize independence with basic ADLs prior to d/c to venue below. At baseline pt is independent with ADLs, drives. Pt currently mod-max A +2 for functional transfers, pivotal steps, and LB ADLs. Pt with at times 10/10 pain level but agreeable and motivated to work with therapy.      Follow Up Recommendations  CIR    Equipment Recommendations  Other (comment)(defer to next venue)    Recommendations for Other Services       Precautions / Restrictions Precautions Precautions: Fall Restrictions Weight Bearing Restrictions: No      Mobility Bed Mobility Overal bed mobility: Needs Assistance Bed Mobility: Supine to Sit     Supine to sit: Min guard;HOB elevated     General bed mobility comments: light assist to LLE. cues for technique.   Transfers Overall transfer level: Needs assistance Equipment used: Rolling walker (2 wheeled);1  person hand held assist Transfers: Sit to/from Stand Sit to Stand: Max assist;+2 physical assistance;Mod assist;+2 safety/equipment         General transfer comment: Cues for technique and positioning of rw, hands and BLE. Significant assist to powerup on intial stand, improved with repitition. Assist to control descent into recliner (used pillows to boost seat height). Heavy reliance on rw and unable to tolerate bearing weight through LLE at this time.     Balance Overall balance assessment: Needs assistance Sitting-balance support: Bilateral upper extremity supported;Feet supported Sitting balance-Leahy Scale: Fair     Standing balance support: Bilateral upper extremity supported;During functional activity Standing balance-Leahy Scale: Poor Standing balance comment: requires help to balance with NWB essentially on LLE                           ADL either performed or assessed with clinical judgement   ADL Overall ADL's : Needs assistance/impaired Eating/Feeding: Set up;Sitting   Grooming: Set up;Sitting   Upper Body Bathing: Set up;Sitting   Lower Body Bathing: Moderate assistance;+2 for physical assistance;Sit to/from stand   Upper Body Dressing : Set up;Sitting   Lower Body Dressing: Moderate assistance;+2 for physical assistance;Sit to/from stand   Toilet Transfer: Minimal assistance;+2 for physical assistance;BSC;RW Toilet Transfer Details (indicate cue type and reason): side stepped, pivotal steps to recliner at foot of bed Toileting- Clothing Manipulation and Hygiene: Moderate assistance;+2 for physical assistance;Sit to/from stand         General ADL Comments: Pt completed bed mobility,  2 sit<>stands, sat EOB several minutes then pivotal steps to foot of bed to sit up in recliner     Vision         Perception     Praxis      Pertinent Vitals/Pain Pain Assessment: 0-10 Pain Score: 10-Worst pain ever Pain Location: left LE posterior knee and  distal, worse with knee extension Pain Descriptors / Indicators: Operative site guarding;Grimacing;Tightness;Sore Pain Intervention(s): Limited activity within patient's tolerance;Monitored during session;Repositioned     Hand Dominance Right   Extremity/Trunk Assessment Upper Extremity Assessment Upper Extremity Assessment: Overall WFL for tasks assessed   Lower Extremity Assessment Lower Extremity Assessment: Defer to PT evaluation   Cervical / Trunk Assessment Cervical / Trunk Assessment: Normal   Communication Communication Communication: No difficulties   Cognition Arousal/Alertness: Awake/alert Behavior During Therapy: WFL for tasks assessed/performed Overall Cognitive Status: Within Functional Limits for tasks assessed                                     General Comments  left LE swollen and wrapped in ace wrap    Exercises     Shoulder Instructions      Home Living Family/patient expects to be discharged to:: Private residence Living Arrangements: Spouse/significant other Available Help at Discharge: Family Type of Home: House Home Access: Level entry;Stairs to enter Entrance Stairs-Number of Steps: stairs at main entrance but uses level entry in basement  Entrance Stairs-Rails: Right Home Layout: Two level Alternate Level Stairs-Number of Steps: lives in basement    Bathroom Shower/Tub: Teaching laboratory technician Toilet: Standard     Home Equipment: Environmental consultant - 2 wheels;Bedside commode;Wheelchair - manual(lift chair he stays in most of time recently)          Prior Functioning/Environment Level of Independence: Needs assistance  Gait / Transfers Assistance Needed: States he has been independent with transfers to wheelchair and 3N1.  Walked with RW some until the LE was swollen again ADL's / Homemaking Assistance Needed: Sponge bathes   Comments: at baseline ambulates without AD, drives. "I even thought about going back to work (a few  months ago)."        OT Problem List: Impaired balance (sitting and/or standing);Decreased knowledge of use of DME or AE;Decreased knowledge of precautions;Pain;Increased edema      OT Treatment/Interventions: Self-care/ADL training;Therapeutic exercise;DME and/or AE instruction;Therapeutic activities;Patient/family education;Balance training    OT Goals(Current goals can be found in the care plan section) Acute Rehab OT Goals Patient Stated Goal: walk better, work on LLE, go home after rehab OT Goal Formulation: With patient Time For Goal Achievement: 01/07/19 Potential to Achieve Goals: Good ADL Goals Pt Will Perform Lower Body Bathing: with modified independence;sit to/from stand Pt Will Perform Lower Body Dressing: with modified independence;sit to/from stand Pt Will Transfer to Toilet: with min guard assist;ambulating Pt Will Perform Toileting - Clothing Manipulation and hygiene: with modified independence;sit to/from stand  OT Frequency: Min 3X/week   Barriers to D/C:            Co-evaluation PT/OT/SLP Co-Evaluation/Treatment: Yes Reason for Co-Treatment: For patient/therapist safety;To address functional/ADL transfers   OT goals addressed during session: ADL's and self-care      AM-PAC OT "6 Clicks" Daily Activity     Outcome Measure Help from another person eating meals?: None Help from another person taking care of personal grooming?: None Help from another person toileting, which includes  using toliet, bedpan, or urinal?: A Lot Help from another person bathing (including washing, rinsing, drying)?: A Lot Help from another person to put on and taking off regular upper body clothing?: None Help from another person to put on and taking off regular lower body clothing?: A Lot 6 Click Score: 18   End of Session Equipment Utilized During Treatment: Rolling walker  Activity Tolerance: Patient tolerated treatment well;Patient limited by pain Patient left: in  chair;with call bell/phone within reach  OT Visit Diagnosis: Unsteadiness on feet (R26.81);Pain Pain - Right/Left: Left Pain - part of body: Knee;Leg;Ankle and joints of foot                Time: 1235-1305 OT Time Calculation (min): 30 min Charges:  OT General Charges $OT Visit: 1 Visit OT Evaluation $OT Eval Low Complexity: [place], OT Acute Rehabilitation Services Pager: [phone] Office: [phone]   Hortencia Pilar 12/24/2018, 1:24 PM

## 2018-12-24 NOTE — Progress Notes (Signed)
OT Cancellation Note  Patient Details Name: [name] MRN: [id number] DOB: 1950/06/29   Cancelled Treatment:    Reason Eval/Treat Not Completed: Other (comment). Pt reporting 8/10 pain. Pt requesting therapy hold until after he sees his MD this morning "they're going to unpack the dressing and I know this is going to be a painful day." Plan to reattempt later today.  Tyrone Schimke, OT Acute Rehabilitation Services Pager: [phone] Office: [phone]  12/24/2018, 9:12 AM

## 2018-12-24 NOTE — Progress Notes (Addendum)
  Progress Note    12/24/2018 7:41 AM 1 Day Post-Op  Subjective:  Says his leg is sore this morning.    Afebrile HR 50's-70's NSR 161'W-960'A systolic 54% RA  Vitals:   12/24/18 0000 12/24/18 0100  BP: 114/77 (!) 117/92  Pulse:    Resp: 12 12  Temp:  97.9 F (36.6 C)  SpO2: 96% 97%    Physical Exam: General:  No distress Lungs:  Non labored Incisions:  Bandage is in place with mild drainage Extremities:  Left foot is warm   CBC    Component Value Date/Time   WBC 11.8 (H) 12/24/2018 0450   RBC 2.90 (L) 12/24/2018 0450   HGB 7.5 (L) 12/24/2018 0450   HCT 23.0 (L) 12/24/2018 0450   PLT 309 12/24/2018 0450   MCV 79.3 (L) 12/24/2018 0450   MCH 25.9 (L) 12/24/2018 0450   MCHC 32.6 12/24/2018 0450   RDW 22.3 (H) 12/24/2018 0450   LYMPHSABS 1.6 12/19/2018 1031   MONOABS 1.0 12/19/2018 1031   EOSABS 0.0 12/19/2018 1031   BASOSABS 0.0 12/19/2018 1031    BMET    Component Value Date/Time   NA 133 (L) 12/24/2018 0450   K 4.6 12/24/2018 0450   CL 99 12/24/2018 0450   CO2 22 12/24/2018 0450   GLUCOSE 291 (H) 12/24/2018 0450   BUN 20 12/24/2018 0450   CREATININE 1.13 12/24/2018 0450   CALCIUM 8.1 (L) 12/24/2018 0450   GFRNONAA >60 12/24/2018 0450   GFRAA >60 12/24/2018 0450    INR    Component Value Date/Time   INR 0.93 12/01/2012 1908     Intake/Output Summary (Last 24 hours) at 12/24/2018 0741 Last data filed at 12/24/2018 0529 Gross per 24 hour  Intake 1218.5 ml  Output 1410 ml  Net -191.5 ml   Component 1d ago  Specimen Description WOUND   Special Requests LEFT LEG   Gram Stain FEW WBC PRESENT, PREDOMINANTLY PMN  NO ORGANISMS SEEN  Performed at [facility], [address] [place], [place] [address]   Culture PENDING   Report Status PENDING   Component 5d ago  Specimen Description WOUND LEFT LOWER LEG   Special Requests PATIENT ON FOLLOWING VANC AND ROCEPHIN   Gram Stain ABUNDANT WBC PRESENT,BOTH PMN AND MONONUCLEAR  MODERATE GRAM  POSITIVE COCCI  Performed at [facility], [place] [address], [place],  [address]   Culture ABUNDANT GROUP B STREP(S.AGALACTIAE)ISOLATED  TESTING AGAINST S. AGALACTIAE NOT ROUTINELY PERFORMED DUE TO PREDICTABILITY OF AMP/PEN/VAN SUSCEPTIBILITY.  NO ANAEROBES ISOLATED; CULTURE IN PROGRESS FOR 5 DAYS       Assessment:  68 y.o. male is s/p:  Washout of left leg wounds  1 Day Post-Op   Plan: -continue ancef  -new cx taken in OR yesterday-no organisms seen on gram stain.  Leukocytosis slightly elevated but stable -will change dressing with Dr. Donzetta Matters when he sees pt later this morning.   -mobilize today   Leontine Locket, PA-C [facility] [phone] 12/24/2018 7:41 AM  I have independently interviewed and examined patient and agree with PA assessment and plan above.  Wounds are healthy there is no further purulence.  Needs wet-to-dry dressings daily.  Patient is okay for CIR from my standpoint.  I will see again on Friday if he is still here.  Henna Derderian C. Donzetta Matters, MD [facility] [place] Office: [phone] Pager: [phone]

## 2018-12-24 NOTE — Progress Notes (Signed)
Physical Therapy Treatment Patient Details Name: [name] MRN: [id number] DOB: [birth year] Today's Date: 12/24/2018    History of Present Illness [name]  is an 68 y.o. male with past medical history significant for IDDM on insulin pump diagnosed 2005, CKD 3, hypertension, severe peripheral vascular disease on Xarelto status post left leg stenting with stent stenosis, s/p subsequent left femoral anterior tibial bypass and multiple left toe amputations, who developed a wound on his left leg near his ankle that he has been taking care of at home. He states that about 2- 3 wks ago a separate swelling was noted behind his knee which eventually increased in size, and began to drain blood and pus. He was sent from the vascular surgery office. He underwent I and D of his calf wound/abscess by vascular surgery after being admitted. 12/21/18 Washout of left leg wounds.    PT Comments    Worked mostly on getting pt's L Knee extended.  Pt given "homework" on L knee positioning and knee extension prolonged stretch exercise.  Further attempted to get w/bearing in the L LE without much success due to pain.  Pt was able to "hop" to the chair on the RW without much difficulty.   Follow Up Recommendations  CIR;Supervision/Assistance - 24 hour     Equipment Recommendations  None recommended by PT    Recommendations for Other Services       Precautions / Restrictions Precautions Precautions: Fall Restrictions Weight Bearing Restrictions: No    Mobility  Bed Mobility Overal bed mobility: Needs Assistance Bed Mobility: Supine to Sit     Supine to sit: Min guard;HOB elevated     General bed mobility comments: light assist to LLE. cues for technique.   Transfers Overall transfer level: Needs assistance Equipment used: Rolling walker (2 wheeled);1 person hand held assist Transfers: Sit to/from Stand Sit to Stand: Max assist;+2 physical assistance;Mod assist;+2 safety/equipment          General transfer comment: Cues for technique and positioning of rw, hands and BLE. Significant assist to powerup on intial stand, improved with repitition. Assist to control descent into recliner (used pillows to boost seat height).   Ambulation/Gait Ambulation/Gait assistance: Min assist Gait Distance (Feet): 4 Feet Assistive device: Rolling walker (2 wheeled) Gait Pattern/deviations: Step-to pattern     General Gait Details: pt unable to put weight down on the L LE and therefore use swing to pattern to "hop" to the chair.   Stairs             Wheelchair Mobility    Modified Rankin (Stroke Patients Only)       Balance Overall balance assessment: Needs assistance Sitting-balance support: Bilateral upper extremity supported;Feet supported Sitting balance-Leahy Scale: Fair     Standing balance support: Bilateral upper extremity supported;During functional activity Standing balance-Leahy Scale: Poor Standing balance comment: requires help to balance with NWB essentially on LLE.   Pt stood in the RW x2 for 20-25 secs trying to straighten and place weight in the L LE without success.                            Cognition Arousal/Alertness: Awake/alert Behavior During Therapy: WFL for tasks assessed/performed Overall Cognitive Status: Within Functional Limits for tasks assessed  Exercises Other Exercises Other Exercises: Prior to mobility, completed 7 reps each of hip/knee flexion/ext ROM exercise bil then worked on L knee extension    General Comments General comments (skin integrity, edema, etc.): left LE swollen and wrapped in ace wrap      Pertinent Vitals/Pain Pain Assessment: 0-10 Pain Score: 10-Worst pain ever Pain Location: left LE posterior knee and distal, worse with knee extension Pain Descriptors / Indicators: Operative site guarding;Grimacing;Tightness;Sore Pain Intervention(s): Limited  activity within patient's tolerance    Home Living Family/patient expects to be discharged to:: Private residence Living Arrangements: Spouse/significant other Available Help at Discharge: Family Type of Home: House Home Access: Level entry;Stairs to enter Entrance Stairs-Rails: Right Home Layout: Two level Home Equipment: Environmental consultant - 2 wheels;Bedside commode;Wheelchair - manual(lift chair he stays in most of time recently)      Prior Function Level of Independence: Needs assistance  Gait / Transfers Assistance Needed: States he has been independent with transfers to wheelchair and 3N1.  Walked with RW some until the LE was swollen again ADL's / Homemaking Assistance Needed: Sponge bathes Comments: at baseline ambulates without AD, drives. "I even thought about going back to work (a few months ago)."   PT Goals (current goals can now be found in the care plan section) Acute Rehab PT Goals Patient Stated Goal: walk better, work on LLE, go home after rehab PT Goal Formulation: With patient Time For Goal Achievement: 01/04/19 Potential to Achieve Goals: Good Progress towards PT goals: Progressing toward goals    Frequency    Min 3X/week      PT Plan Current plan remains appropriate    Co-evaluation PT/OT/SLP Co-Evaluation/Treatment: Yes Reason for Co-Treatment: For patient/therapist safety;To address functional/ADL transfers PT goals addressed during session: Mobility/safety with mobility OT goals addressed during session: ADL's and self-care      AM-PAC PT "6 Clicks" Mobility   Outcome Measure  Help needed turning from your back to your side while in a flat bed without using bedrails?: None Help needed moving from lying on your back to sitting on the side of a flat bed without using bedrails?: None Help needed moving to and from a bed to a chair (including a wheelchair)?: A Lot Help needed standing up from a chair using your arms (e.g., wheelchair or bedside chair)?: A  Lot Help needed to walk in hospital room?: A Little Help needed climbing 3-5 steps with a railing? : A Lot 6 Click Score: 17    End of Session     Patient left: in chair;with call bell/phone within reach;with chair alarm set Nurse Communication: Mobility status PT Visit Diagnosis: Unsteadiness on feet (R26.81);Other abnormalities of gait and mobility (R26.89);Pain Pain - Right/Left: Left Pain - part of body: Knee     Time: 9024-0973 PT Time Calculation (min) (ACUTE ONLY): 30 min  Charges:  $Therapeutic Activity: 8-22 mins                     12/24/2018  Donnella Sham, PT Acute Rehabilitation Services [phone]  (pager) [phone]  (office)   Tessie Fass Hiren Peplinski 12/24/2018, 4:02 PM

## 2018-12-25 LAB — AEROBIC/ANAEROBIC CULTURE W GRAM STAIN (SURGICAL/DEEP WOUND)

## 2018-12-25 LAB — BASIC METABOLIC PANEL
Anion gap: 9 (ref 5–15)
BUN: 22 mg/dL (ref 8–23)
CO2: 23 mmol/L (ref 22–32)
Calcium: 8 mg/dL — ABNORMAL LOW (ref 8.9–10.3)
Chloride: 101 mmol/L (ref 98–111)
Creatinine, Ser: 0.9 mg/dL (ref 0.61–1.24)
GFR calc Af Amer: >60 mL/min (ref >60)
GFR calc non Af Amer: >60 mL/min (ref >60)
Glucose, Bld: 253 mg/dL — ABNORMAL HIGH (ref 70–99)
Potassium: 4.3 mmol/L (ref 3.5–5.1)
Sodium: 133 mmol/L — ABNORMAL LOW (ref 135–145)

## 2018-12-25 LAB — HEMOGLOBIN AND HEMATOCRIT, BLOOD
HCT: 29.3 % — ABNORMAL LOW (ref 39.0–52.0)
Hemoglobin: 9.7 g/dL — ABNORMAL LOW (ref 13.0–17.0)

## 2018-12-25 LAB — GLUCOSE, CAPILLARY
Glucose-Capillary: 188 mg/dL — ABNORMAL HIGH (ref 70–99)
Glucose-Capillary: 267 mg/dL — ABNORMAL HIGH (ref 70–99)
Glucose-Capillary: 207 mg/dL — ABNORMAL HIGH (ref 70–99)
Glucose-Capillary: 157 mg/dL — ABNORMAL HIGH (ref 70–99)

## 2018-12-25 LAB — CBC
HCT: 21.4 % — ABNORMAL LOW (ref 39.0–52.0)
Hemoglobin: 6.9 g/dL — CL (ref 13.0–17.0)
MCH: 25.5 pg — ABNORMAL LOW (ref 26.0–34.0)
MCHC: 32.2 g/dL (ref 30.0–36.0)
MCV: 79 fL — ABNORMAL LOW (ref 80.0–100.0)
Platelets: 313 10*3/uL (ref 150–400)
RBC: 2.71 MIL/uL — ABNORMAL LOW (ref 4.22–5.81)
RDW: 22.5 % — ABNORMAL HIGH (ref 11.5–15.5)
WBC: 9.1 10*3/uL (ref 4.0–10.5)
nRBC: 0 % (ref 0.0–0.2)

## 2018-12-25 LAB — PREPARE RBC (CROSSMATCH)

## 2018-12-25 MED ORDER — SODIUM CHLORIDE 0.9% IV SOLUTION
Freq: Once | INTRAVENOUS | Status: AC
  Administered 2018-12-25: 11:00:00 via INTRAVENOUS

## 2018-12-25 MED ORDER — SODIUM CHLORIDE 0.9% IV SOLUTION
Freq: Once | INTRAVENOUS | Status: AC
  Administered 2018-12-25: 05:00:00 via INTRAVENOUS
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
## 2018-12-25 NOTE — Progress Notes (Signed)
Dressing on left leg changed, cleaned with 0.9% NSS, wounds packed with moist to dry dressing, 4x4 gauzes, covered by ABD pad and elastic bandage. Pt had minimal bleeding from the proximal upper wound. Pt tolerated well. Vital sings remained stable , no distress noted. Will continue to monitor.  Kennyth Lose, RN

## 2018-12-25 NOTE — Progress Notes (Signed)
Date and time results received: 12/25/18 at 0402am.  Test: CBC  Critical Value: Hb 6.9  Name of Provider Notified: On call provider,Ms. Schorr,NP  Orders Received for type and screen and 1 unit of PRBC  Kennyth Lose, RN

## 2018-12-25 NOTE — Progress Notes (Signed)
Inpatient Diabetes Program Recommendations  AACE/ADA: New Consensus Statement on Inpatient Glycemic Control (2015)  Target Ranges:  Prepandial:   less than 140 mg/dL      Peak postprandial:   less than 180 mg/dL (1-2 hours)      Critically ill patients:  140 - 180 mg/dL   Lab Results  Component Value Date   GLUCAP 188 (H) 12/25/2018   HGBA1C 6.5 (H) 06/28/2018    Review of Glycemic Control Results for BLADE, SCHEFF (MRN [id number]) as of 12/25/2018 10:11  Ref. Range 12/24/2018 13:16 12/24/2018 16:24 12/24/2018 22:13 12/25/2018 06:16  Glucose-Capillary Latest Ref Range: 70 - 99 mg/dL 321 (H) 239 (H) 240 (H) 188 (H)   Diabetes history: type 2 Outpatient Diabetes medications: V-Go 30- with 5 clicks with meals (10 units TID), Novolog 0-15 units TID, Metformin 1000 mg QD, Victoza 1.2 mg QD Current orders for Inpatient glycemic control: Lantus 30 units daily, Novolog MODERATE correction scale TID & HS, Novolog 5 U TID  Inpatient Diabetes Program Recommendations:     Recommend increasing meal coverage to Novolog 10 units TID (asusming that patient is consuming >50% of meal).   Noted in MAR meal coverage was considered a "duplicate order". RN notified and education provided. Encouraged administration as this was not a duplicate order.   Thanks, Bronson Curb, MSN, RNC-OB Diabetes Coordinator [phone] (8a-5p)

## 2018-12-25 NOTE — Progress Notes (Signed)
PROGRESS NOTE    [name]   PJA:[id number]  DOB: 1950-10-21  DOA: 12/19/2018 PCP: [name], MD   Brief Narrative:  [name]  is an 68 y.o. male with past medical history significant for IDDM on insulin pump diagnosed 2005, CKD 3, hypertension, severe peripheral vascular disease on Xarelto status post left leg stenting with stent stenosis, s/p subsequent left femoral anterior tibial bypass and multiple left toe amputations, who developed a wound on his left leg near his ankle that he has been taking care of at home. He states that about 2- 3 wks ago a separate swelling was noted behind his knee which eventually increased in size, and began to drain blood and pus. He was sent from the vascular surgery office. Noted to have BP 79/52, Hb of 6.7, sodium 128, Bun 50, Cr 2.20, Lactic acid 3.2.  Subjective: He is constipated. No other complaints.   Assessment & Plan:   Severe sepsis 2/2 wound on left leg Severe PVD of left leg -his leg wounds have been managed as outpt by vascular surgery -s/p I and D on 6/26; 6/28/ and 7/2 by vascular surgery with noted tunneling of abscess into calf - Blood cultures NGTD - wound cultures- moderate gr + cocci on gram stain- results show abundant group B strep  - cont Ancef  - vascular surgery to decide on when Xarelto can be resumed- it was started in the fall by Dr [name] for severe PVD - PT recommends CIR- have consulted them- patient in agreement - likely dispo in the next 24h pending placement/insurance approval.  Acute blood loss anemia superimposed on anemia of chronic disease, ongoing -Hb 6.9 this morning - s/p 2 uPRBC -Baseline appears to be 8.2- he has had bleeding form his leg wound which may be the cause -s/p transfusion of 1 U PRBC on 6/26 -microcytosis noted - Iron saturation is low normal and he could benefit from Iron replacement orally which has been started  Acute on questionable chronic renal failure - previously reported  CKD3, resolved Concurrent Hyponatremia -due to dehydration in setting of HCTZ and poor oral intake at home-   hold HCTZ -Cr 2.2 on admit - currently 0.9 - baseline Cr ~ 1.2-1.7   IDDM (insulin dependent diabetes mellitus)  - on insulin pump at home- cont Lantus and SSI for now  - last A1c 6.5 in 06/28/18 - Increased Lantus a few days ago and added mealtime insulin   -Glucose remains elevated today - requiring 35+ units of sliding scale -Increase lantus to 30u BID from 26 - continue current meal time regimen  Hypotension with h/o HTN (hypertension) - on Coreg, Maxzide and Losartan at home - Hold Maxzide and Losartan due to AKI, dehydration - holding Coreg as BP running low  Gout, no acute issues - cont Allopurinol & Colchicine     Time spent in minutes: 35 min DVT prophylaxis: per vascular surgery Code Status: Full code Family Communication: spoke with his wife Disposition Plan: plan for CIR due to remarkable need for ongoing physical therapy, close monitoring, f/u on wound cultures/infection which will be sent again today Consultants:   Vascular surgery Procedures:   I and D x3 Antimicrobials:  Anti-infectives (From admission, onward)   Start     Dose/Rate Route Frequency Ordered Stop   12/23/18 1049  ceFAZolin (ANCEF) 2-4 GM/100ML-% IVPB    Note to Pharmacy: Elige Ko   : cabinet override      12/23/18 1049 12/23/18 2259  12/21/18 1400  ceFAZolin (ANCEF) IVPB 1 g/50 mL premix     1 g 100 mL/hr over 30 Minutes Intravenous Every 8 hours 12/21/18 0959     12/21/18 1200  vancomycin (VANCOCIN) 1,250 mg in sodium chloride 0.9 % 250 mL IVPB  Status:  Discontinued     1,250 mg 166.7 mL/hr over 90 Minutes Intravenous Every 24 hours 12/20/18 1254 12/21/18 0959   12/20/18 1200  vancomycin (VANCOCIN) IVPB 1000 mg/200 mL premix  Status:  Discontinued     1,000 mg 200 mL/hr over 60 Minutes Intravenous Every 24 hours 12/19/18 1205 12/20/18 1254   12/19/18 1700  metroNIDAZOLE  (FLAGYL) IVPB 500 mg  Status:  Discontinued     500 mg 100 mL/hr over 60 Minutes Intravenous Every 8 hours 12/19/18 1600 12/21/18 0959   12/19/18 1700  ceFEPIme (MAXIPIME) 2 g in sodium chloride 0.9 % 100 mL IVPB  Status:  Discontinued     2 g 200 mL/hr over 30 Minutes Intravenous Every 12 hours 12/19/18 1543 12/21/18 0959   12/19/18 1100  vancomycin (VANCOCIN) IVPB 1000 mg/200 mL premix  Status:  Discontinued     1,000 mg 200 mL/hr over 60 Minutes Intravenous  Once 12/19/18 1051 12/19/18 1058   12/19/18 1100  cefTRIAXone (ROCEPHIN) 2 g in sodium chloride 0.9 % 100 mL IVPB     2 g 200 mL/hr over 30 Minutes Intravenous  Once 12/19/18 1051 12/19/18 1155   12/19/18 1100  vancomycin (VANCOCIN) 2,000 mg in sodium chloride 0.9 % 500 mL IVPB     2,000 mg 250 mL/hr over 120 Minutes Intravenous  Once 12/19/18 1058 12/19/18 1358     Objective: Vitals:   12/25/18 0700 12/25/18 1030 12/25/18 1103 12/25/18 1330  BP: (!) 143/85 126/70 132/88 140/75  Pulse:  65 65 67  Resp: (!) 23 18 17 16   Temp: 98.1 F (36.7 C) 98 F (36.7 C) 98.2 F (36.8 C) 98.1 F (36.7 C)  TempSrc: Oral Oral Oral Oral  SpO2: 99% 99% 100% 100%  Weight:      Height:        Intake/Output Summary (Last 24 hours) at 12/25/2018 1554 Last data filed at 12/25/2018 1500 Gross per 24 hour  Intake 1808.67 ml  Output 1350 ml  Net 458.67 ml   Filed Weights   12/23/18 0500 12/24/18 0500 12/25/18 0427  Weight: 102.2 kg 102.2 kg 104.6 kg    Examination: General exam: Appears comfortable  HEENT: PERRLA, oral mucosa moist, no sclera icterus or thrush Respiratory system: Clear to auscultation. Respiratory effort normal. Cardiovascular system: S1 & S2 heard,  No murmurs  Gastrointestinal system: Abdomen soft, non-tender, nondistended. Normal bowel sounds   Central nervous system: Alert and oriented. No focal neurological deficits. Extremities: No cyanosis, clubbing - edema of left foot noted Skin: No rashes or ulcers  Psychiatry:  Mood & affect appropriate. .    Data Reviewed: I have personally reviewed following labs and imaging studies  CBC: Recent Labs  Lab 12/19/18 1031  12/21/18 0407 12/22/18 0435 12/23/18 0800 12/24/18 0450 12/25/18 0315  WBC 10.6*   < > 9.4 9.2 11.2* 11.8* 9.1  NEUTROABS 7.8*  --   --   --   --   --   --   HGB 6.7*   < > 8.2* 8.1* 8.3* 7.5* 6.9*  HCT 21.8*   < > 25.5* 24.9* 25.6* 23.0* 21.4*  MCV 73.6*   < > 78.9* 79.8* 79.0* 79.3* 79.0*  PLT 555*   < >  373 387 344 309 313   < > = values in this interval not displayed.   Basic Metabolic Panel: Recent Labs  Lab 12/21/18 0407 12/22/18 0435 12/23/18 0800 12/24/18 0450 12/25/18 0315  NA 132* 133* 132* 133* 133*  K 4.6 4.4 3.9 4.6 4.3  CL 103 103 102 99 101  CO2 21* 22 22 22 23   GLUCOSE 288* 252* 97 291* 253*  BUN 33* 23 21 20 22   CREATININE 1.44* 1.15 1.00 1.13 0.90  CALCIUM 8.2* 8.0* 8.1* 8.1* 8.0*   GFR: Estimated Creatinine Clearance: 101.2 mL/min (by C-G formula based on SCr of 0.9 mg/dL). Liver Function Tests: Recent Labs  Lab 12/19/18 1031  AST 24  ALT 19  ALKPHOS 94  BILITOT 0.8  PROT 7.3  ALBUMIN 1.8*   No results for input(s): LIPASE, AMYLASE in the last 168 hours. No results for input(s): AMMONIA in the last 168 hours. Coagulation Profile: No results for input(s): INR, PROTIME in the last 168 hours. Cardiac Enzymes: No results for input(s): CKTOTAL, CKMB, CKMBINDEX, TROPONINI in the last 168 hours. BNP (last 3 results) No results for input(s): PROBNP in the last 8760 hours. HbA1C: No results for input(s): HGBA1C in the last 72 hours. CBG: Recent Labs  Lab 12/24/18 1316 12/24/18 1624 12/24/18 2213 12/25/18 0616 12/25/18 1217  GLUCAP 321* 239* 240* 188* 267*   Lipid Profile: No results for input(s): CHOL, HDL, LDLCALC, TRIG, CHOLHDL, LDLDIRECT in the last 72 hours. Thyroid Function Tests: No results for input(s): TSH, T4TOTAL, FREET4, T3FREE, THYROIDAB in the last 72 hours.  Anemia Panel: No results for input(s): VITAMINB12, FOLATE, FERRITIN, TIBC, IRON, RETICCTPCT in the last 72 hours. Urine analysis:    Component Value Date/Time   COLORURINE YELLOW 12/19/2018 2015   APPEARANCEUR CLEAR 12/19/2018 2015   LABSPEC 1.011 12/19/2018 2015   PHURINE 5.0 12/19/2018 2015   GLUCOSEU NEGATIVE 12/19/2018 2015   HGBUR NEGATIVE 12/19/2018 2015   BILIRUBINUR NEGATIVE 12/19/2018 2015   KETONESUR NEGATIVE 12/19/2018 2015   PROTEINUR NEGATIVE 12/19/2018 2015   UROBILINOGEN 0.2 12/01/2012 2135   NITRITE NEGATIVE 12/19/2018 2015   LEUKOCYTESUR NEGATIVE 12/19/2018 2015   Recent Results (from the past 240 hour(s))  Blood Culture (routine x 2)     Status: None   Collection Time: 12/19/18 10:36 AM   Specimen: BLOOD  Result Value Ref Range Status   Specimen Description BLOOD BLOOD LEFT HAND  Final   Special Requests   Final    BOTTLES DRAWN AEROBIC AND ANAEROBIC Blood Culture adequate volume   Culture   Final    NO GROWTH 5 DAYS Performed at [facility], [place] [address] [place], [place] [address]    Report Status 12/24/2018 FINAL  Final  Blood Culture (routine x 2)     Status: None   Collection Time: 12/19/18 10:48 AM   Specimen: BLOOD  Result Value Ref Range Status   Specimen Description BLOOD RIGHT ANTECUBITAL  Final   Special Requests   Final    BOTTLES DRAWN AEROBIC AND ANAEROBIC Blood Culture adequate volume   Culture   Final    NO GROWTH 5 DAYS Performed at [facility], [place] [address], [place], [place] [address]    Report Status 12/24/2018 FINAL  Final  SARS Coronavirus 2 (CEPHEID - Performed in [place] hospital lab), Hosp Order     Status: None   Collection Time: 12/19/18 10:56 AM   Specimen: Nasopharyngeal Swab  Result Value Ref Range Status  SARS Coronavirus 2 NEGATIVE NEGATIVE Final    Comment: (NOTE) If result is NEGATIVE SARS-CoV-2 target nucleic acids are NOT DETECTED. The SARS-CoV-2 RNA is generally detectable in upper and  lower  respiratory specimens during the acute phase of infection. The lowest  concentration of SARS-CoV-2 viral copies this assay can detect is 250  copies / mL. A negative result does not preclude SARS-CoV-2 infection  and should not be used as the sole basis for treatment or other  patient management decisions.  A negative result may occur with  improper specimen collection / handling, submission of specimen other  than nasopharyngeal swab, presence of viral mutation(s) within the  areas targeted by this assay, and inadequate number of viral copies  (<250 copies / mL). A negative result must be combined with clinical  observations, patient history, and epidemiological information. If result is POSITIVE SARS-CoV-2 target nucleic acids are DETECTED. The SARS-CoV-2 RNA is generally detectable in upper and lower  respiratory specimens dur ing the acute phase of infection.  Positive  results are indicative of active infection with SARS-CoV-2.  Clinical  correlation with patient history and other diagnostic information is  necessary to determine patient infection status.  Positive results do  not rule out bacterial infection or co-infection with other viruses. If result is PRESUMPTIVE POSTIVE SARS-CoV-2 nucleic acids MAY BE PRESENT.   A presumptive positive result was obtained on the submitted specimen  and confirmed on repeat testing.  While 2019 novel coronavirus  (SARS-CoV-2) nucleic acids may be present in the submitted sample  additional confirmatory testing may be necessary for epidemiological  and / or clinical management purposes  to differentiate between  SARS-CoV-2 and other Sarbecovirus currently known to infect humans.  If clinically indicated additional testing with an alternate test  methodology [phone]) is advised. The SARS-CoV-2 RNA is generally  detectable in upper and lower respiratory sp ecimens during the acute  phase of infection. The expected result is Negative.  Fact Sheet for Patients:  StrictlyIdeas.no Fact Sheet for Healthcare Providers: BankingDealers.co.za This test is not yet approved or cleared by the [place] FDA and has been authorized for detection and/or diagnosis of SARS-CoV-2 by FDA under an Emergency Use Authorization (EUA).  This EUA will remain in effect (meaning this test can be used) for the duration of the COVID-19 declaration under Section 564(b)(1) of the Act, 21 U.S.C. section 360bbb-3(b)(1), unless the authorization is terminated or revoked sooner. Performed at [facility], [place] [address] [place], [place] [address]   Aerobic/Anaerobic Culture (surgical/deep wound)     Status: None   Collection Time: 12/19/18  2:38 PM   Specimen: Wound  Result Value Ref Range Status   Specimen Description WOUND LEFT LOWER LEG  Final   Special Requests PATIENT ON FOLLOWING VANC AND ROCEPHIN  Final   Gram Stain   Final    ABUNDANT WBC PRESENT,BOTH PMN AND MONONUCLEAR MODERATE GRAM POSITIVE COCCI    Culture   Final    ABUNDANT GROUP B STREP(S.AGALACTIAE)ISOLATED TESTING AGAINST S. AGALACTIAE NOT ROUTINELY PERFORMED DUE TO PREDICTABILITY OF AMP/PEN/VAN SUSCEPTIBILITY. NO ANAEROBES ISOLATED Performed at [facility], [place] [address] [place], [place] [address]    Report Status 12/25/2018 FINAL  Final  Surgical pcr screen     Status: Abnormal   Collection Time: 12/21/18 12:04 AM   Specimen: Nasal Mucosa; Nasal Swab  Result Value Ref Range Status   MRSA, PCR POSITIVE (A) NEGATIVE Final    Comment: RESULT CALLED TO,  READ BACK BY AND VERIFIED WITH: RN T IRBY @0207  12/21/18 BY S GEZAHEGN    Staphylococcus aureus POSITIVE (A) NEGATIVE Final    Comment: (NOTE) The Xpert SA Assay (FDA approved for NASAL specimens in patients 52 years of age and older), is one component of a comprehensive surveillance program. It is not intended to diagnose infection nor to guide or monitor  treatment. Performed at [facility], [place] [address] [place], [place] [address]   Aerobic/Anaerobic Culture (surgical/deep wound)     Status: None (Preliminary result)   Collection Time: 12/23/18 11:40 AM   Specimen: Wound  Result Value Ref Range Status   Specimen Description WOUND  Final   Special Requests LEFT LEG  Final   Gram Stain   Final    FEW WBC PRESENT, PREDOMINANTLY PMN NO ORGANISMS SEEN    Culture   Final    NO GROWTH 2 DAYS NO ANAEROBES ISOLATED; CULTURE IN PROGRESS FOR 5 DAYS Performed at [place] [address], [place], [place] [address]    Report Status PENDING  Incomplete    Radiology Studies: No results found.  Scheduled Meds: . bisacodyl  10 mg Rectal Once  . colchicine  0.6 mg Oral BID  . dorzolamide-timolol  1 drop Both Eyes BID  . ferrous sulfate  325 mg Oral BID WC  . heparin injection (subcutaneous)  5,000 Units Subcutaneous Q8H  . insulin aspart  0-15 Units Subcutaneous TID WC  . insulin aspart  0-5 Units Subcutaneous QHS  . insulin aspart  5 Units Subcutaneous TID WC  . insulin glargine  30 Units Subcutaneous QHS  . latanoprost  1 drop Both Eyes QHS  . polyethylene glycol  17 g Oral Daily  . pravastatin  40 mg Oral QHS  . pregabalin  50 mg Oral QHS   Continuous Infusions: . sodium chloride Stopped (12/20/18 0010)  .  ceFAZolin (ANCEF) IV Stopped (12/25/18 1429)  . lactated ringers 10 mL/hr at 12/23/18 1009     LOS: 6 days   Little Ishikawa, MD [facility] Pager: [email or web address] Password [facility] 12/25/2018, 3:54 PM

## 2018-12-25 NOTE — Progress Notes (Signed)
Physical Therapy Treatment Patient Details Name: [name] MRN: [id number] DOB: [birth year] Today's Date: 12/25/2018    History of Present Illness [name]  is an 68 y.o. male with past medical history significant for IDDM on insulin pump diagnosed 2005, CKD 3, hypertension, severe peripheral vascular disease on Xarelto status post left leg stenting with stent stenosis, s/p subsequent left femoral anterior tibial bypass and multiple left toe amputations, who developed a wound on his left leg near his ankle that he has been taking care of at home. He states that about 2- 3 wks ago a separate swelling was noted behind his knee which eventually increased in size, and began to drain blood and pus. He was sent from the vascular surgery office. He underwent I and D of his calf wound/abscess by vascular surgery after being admitted. 12/21/18 Washout of left leg wounds.    PT Comments    Pt showed visible improvement in ability to extend L knee in stance.  Emphasis on w/bearing and assisted knee extension during gait training in the RW.   Follow Up Recommendations  CIR;Supervision/Assistance - 24 hour     Equipment Recommendations  None recommended by PT    Recommendations for Other Services       Precautions / Restrictions Precautions Precautions: Fall    Mobility  Bed Mobility Overal bed mobility: Needs Assistance       Supine to sit: Supervision        Transfers Overall transfer level: Needs assistance Equipment used: Rolling walker (2 wheeled) Transfers: Sit to/from Stand Sit to Stand: Mod assist         General transfer comment: cues for hand placement/demonstration.  Assist to boos up.  Ambulation/Gait Ambulation/Gait assistance: Min assist Gait Distance (Feet): 20 Feet(x2) Assistive device: Rolling walker (2 wheeled) Gait Pattern/deviations: Step-to pattern     General Gait Details: Assisted pt to extend knee and get heel to the floor in  w/bearing.   Stairs             Wheelchair Mobility    Modified Rankin (Stroke Patients Only)       Balance     Sitting balance-Leahy Scale: Fair     Standing balance support: Bilateral upper extremity supported;During functional activity Standing balance-Leahy Scale: Poor Standing balance comment: reliant on the AD                            Cognition Arousal/Alertness: Awake/alert Behavior During Therapy: WFL for tasks assessed/performed Overall Cognitive Status: Within Functional Limits for tasks assessed                                        Exercises      General Comments General comments (skin integrity, edema, etc.): Left pt working on active knee extension while positioned in neutral hip rotation.      Pertinent Vitals/Pain Pain Assessment: Faces Faces Pain Scale: Hurts even more Pain Location: behind L knee Pain Descriptors / Indicators: Operative site guarding;Grimacing;Tightness;Sore Pain Intervention(s): Monitored during session    Home Living                      Prior Function Level of Independence: Independent with assistive device(s)          PT Goals (current goals can now be found in the care  plan section) Acute Rehab PT Goals Patient Stated Goal: walk better, work on LLE, go home after rehab PT Goal Formulation: With patient Time For Goal Achievement: 01/04/19 Potential to Achieve Goals: Good Progress towards PT goals: Progressing toward goals    Frequency    Min 3X/week      PT Plan Current plan remains appropriate    Co-evaluation              AM-PAC PT "6 Clicks" Mobility   Outcome Measure  Help needed turning from your back to your side while in a flat bed without using bedrails?: None Help needed moving from lying on your back to sitting on the side of a flat bed without using bedrails?: None Help needed moving to and from a bed to a chair (including a wheelchair)?: A  Little Help needed standing up from a chair using your arms (e.g., wheelchair or bedside chair)?: A Little Help needed to walk in hospital room?: A Little Help needed climbing 3-5 steps with a railing? : A Lot 6 Click Score: 19    End of Session     Patient left: in chair;with call bell/phone within reach;with chair alarm set   PT Visit Diagnosis: Other abnormalities of gait and mobility (R26.89) Pain - Right/Left: Left Pain - part of body: Knee     Time: 1435-1459 PT Time Calculation (min) (ACUTE ONLY): 24 min  Charges:  $Gait Training: 8-22 mins $Therapeutic Activity: 8-22 mins                     12/25/2018  [name], PT Acute Rehabilitation Services [phone]  (pager) [phone]  (office)   [name] 12/25/2018, 5:21 PM

## 2018-12-25 NOTE — Progress Notes (Signed)
Inpatient Rehab Admissions Coordinator:   Met with patient at bedside.  Hgb this AM <7 and pt receiving 1 unit PRBCs.  Will f/u for repeat CBC after this is done to determine whether I can admit pt today versus tomorrow.   Caitlin Warren, PT, DPT Admissions Coordinator [phone] 12/25/18  11:49 AM   
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
## 2018-12-26 ENCOUNTER — Other Ambulatory Visit: Payer: Self-pay

## 2018-12-26 ENCOUNTER — Inpatient Hospital Stay (HOSPITAL_COMMUNITY)
Admission: RE | Admit: 2018-12-26 | Discharge: 2019-01-14 | DRG: 945 | Disposition: A | Discharge status: Home Health | Payer: BC Managed Care – PPO | Source: Intra-hospital | Attending: Physical Medicine & Rehabilitation | Admitting: Physical Medicine & Rehabilitation

## 2018-12-26 ENCOUNTER — Encounter (HOSPITAL_COMMUNITY): Payer: Self-pay | Admitting: *Deleted

## 2018-12-26 DIAGNOSIS — I951 Orthostatic hypotension: Secondary | ICD-10-CM | POA: Diagnosis not present

## 2018-12-26 DIAGNOSIS — E1142 Type 2 diabetes mellitus with diabetic polyneuropathy: Secondary | ICD-10-CM | POA: Diagnosis present

## 2018-12-26 DIAGNOSIS — Z9641 Presence of insulin pump (external) (internal): Secondary | ICD-10-CM | POA: Diagnosis present

## 2018-12-26 DIAGNOSIS — I10 Essential (primary) hypertension: Secondary | ICD-10-CM | POA: Diagnosis present

## 2018-12-26 DIAGNOSIS — M25562 Pain in left knee: Secondary | ICD-10-CM

## 2018-12-26 DIAGNOSIS — E1151 Type 2 diabetes mellitus with diabetic peripheral angiopathy without gangrene: Secondary | ICD-10-CM | POA: Diagnosis present

## 2018-12-26 DIAGNOSIS — F331 Major depressive disorder, recurrent, moderate: Secondary | ICD-10-CM | POA: Diagnosis present

## 2018-12-26 DIAGNOSIS — M25469 Effusion, unspecified knee: Secondary | ICD-10-CM | POA: Diagnosis not present

## 2018-12-26 DIAGNOSIS — T148XXA Other injury of unspecified body region, initial encounter: Secondary | ICD-10-CM

## 2018-12-26 DIAGNOSIS — R7309 Other abnormal glucose: Secondary | ICD-10-CM | POA: Diagnosis not present

## 2018-12-26 DIAGNOSIS — Z794 Long term (current) use of insulin: Secondary | ICD-10-CM | POA: Diagnosis not present

## 2018-12-26 DIAGNOSIS — R5381 Other malaise: Secondary | ICD-10-CM

## 2018-12-26 DIAGNOSIS — D473 Essential (hemorrhagic) thrombocythemia: Secondary | ICD-10-CM

## 2018-12-26 DIAGNOSIS — E1122 Type 2 diabetes mellitus with diabetic chronic kidney disease: Secondary | ICD-10-CM | POA: Diagnosis present

## 2018-12-26 DIAGNOSIS — M1711 Unilateral primary osteoarthritis, right knee: Secondary | ICD-10-CM | POA: Diagnosis present

## 2018-12-26 DIAGNOSIS — L089 Local infection of the skin and subcutaneous tissue, unspecified: Secondary | ICD-10-CM

## 2018-12-26 DIAGNOSIS — G8929 Other chronic pain: Secondary | ICD-10-CM | POA: Diagnosis present

## 2018-12-26 DIAGNOSIS — Z86718 Personal history of other venous thrombosis and embolism: Secondary | ICD-10-CM | POA: Diagnosis not present

## 2018-12-26 DIAGNOSIS — E1165 Type 2 diabetes mellitus with hyperglycemia: Secondary | ICD-10-CM | POA: Diagnosis present

## 2018-12-26 DIAGNOSIS — Z833 Family history of diabetes mellitus: Secondary | ICD-10-CM

## 2018-12-26 DIAGNOSIS — E611 Iron deficiency: Secondary | ICD-10-CM | POA: Diagnosis present

## 2018-12-26 DIAGNOSIS — E1169 Type 2 diabetes mellitus with other specified complication: Secondary | ICD-10-CM | POA: Diagnosis not present

## 2018-12-26 DIAGNOSIS — K5900 Constipation, unspecified: Secondary | ICD-10-CM | POA: Diagnosis present

## 2018-12-26 DIAGNOSIS — D75839 Thrombocytosis, unspecified: Secondary | ICD-10-CM

## 2018-12-26 DIAGNOSIS — N183 Chronic kidney disease, stage 3 (moderate): Secondary | ICD-10-CM | POA: Diagnosis present

## 2018-12-26 DIAGNOSIS — R799 Abnormal finding of blood chemistry, unspecified: Secondary | ICD-10-CM

## 2018-12-26 DIAGNOSIS — E7800 Pure hypercholesterolemia, unspecified: Secondary | ICD-10-CM | POA: Diagnosis present

## 2018-12-26 DIAGNOSIS — R0989 Other specified symptoms and signs involving the circulatory and respiratory systems: Secondary | ICD-10-CM | POA: Diagnosis not present

## 2018-12-26 DIAGNOSIS — D62 Acute posthemorrhagic anemia: Secondary | ICD-10-CM | POA: Diagnosis present

## 2018-12-26 DIAGNOSIS — Z87891 Personal history of nicotine dependence: Secondary | ICD-10-CM

## 2018-12-26 DIAGNOSIS — E8809 Other disorders of plasma-protein metabolism, not elsewhere classified: Secondary | ICD-10-CM | POA: Diagnosis present

## 2018-12-26 DIAGNOSIS — N179 Acute kidney failure, unspecified: Secondary | ICD-10-CM | POA: Diagnosis present

## 2018-12-26 DIAGNOSIS — E46 Unspecified protein-calorie malnutrition: Secondary | ICD-10-CM

## 2018-12-26 DIAGNOSIS — R52 Pain, unspecified: Secondary | ICD-10-CM

## 2018-12-26 DIAGNOSIS — E1141 Type 2 diabetes mellitus with diabetic mononeuropathy: Secondary | ICD-10-CM | POA: Diagnosis present

## 2018-12-26 DIAGNOSIS — G5732 Lesion of lateral popliteal nerve, left lower limb: Secondary | ICD-10-CM | POA: Diagnosis not present

## 2018-12-26 DIAGNOSIS — I129 Hypertensive chronic kidney disease with stage 1 through stage 4 chronic kidney disease, or unspecified chronic kidney disease: Secondary | ICD-10-CM | POA: Diagnosis present

## 2018-12-26 DIAGNOSIS — E871 Hypo-osmolality and hyponatremia: Secondary | ICD-10-CM | POA: Diagnosis present

## 2018-12-26 DIAGNOSIS — T8189XS Other complications of procedures, not elsewhere classified, sequela: Secondary | ICD-10-CM

## 2018-12-26 DIAGNOSIS — E119 Type 2 diabetes mellitus without complications: Secondary | ICD-10-CM

## 2018-12-26 LAB — CBC
HCT: 26.4 % — ABNORMAL LOW (ref 39.0–52.0)
Hemoglobin: 8.7 g/dL — ABNORMAL LOW (ref 13.0–17.0)
MCH: 26.4 pg (ref 26.0–34.0)
MCHC: 33 g/dL (ref 30.0–36.0)
MCV: 80 fL (ref 80.0–100.0)
Platelets: 271 10*3/uL (ref 150–400)
RBC: 3.3 MIL/uL — ABNORMAL LOW (ref 4.22–5.81)
RDW: 20.7 % — ABNORMAL HIGH (ref 11.5–15.5)
WBC: 9.5 10*3/uL (ref 4.0–10.5)
nRBC: 0 % (ref 0.0–0.2)

## 2018-12-26 LAB — BASIC METABOLIC PANEL
Anion gap: 8 (ref 5–15)
BUN: 17 mg/dL (ref 8–23)
CO2: 22 mmol/L (ref 22–32)
Calcium: 8 mg/dL — ABNORMAL LOW (ref 8.9–10.3)
Chloride: 101 mmol/L (ref 98–111)
Creatinine, Ser: 0.98 mg/dL (ref 0.61–1.24)
GFR calc Af Amer: >60 mL/min (ref >60)
GFR calc non Af Amer: >60 mL/min (ref >60)
Glucose, Bld: 197 mg/dL — ABNORMAL HIGH (ref 70–99)
Potassium: 3.7 mmol/L (ref 3.5–5.1)
Sodium: 131 mmol/L — ABNORMAL LOW (ref 135–145)

## 2018-12-26 LAB — GLUCOSE, CAPILLARY
Glucose-Capillary: 150 mg/dL — ABNORMAL HIGH (ref 70–99)
Glucose-Capillary: 183 mg/dL — ABNORMAL HIGH (ref 70–99)
Glucose-Capillary: 264 mg/dL — ABNORMAL HIGH (ref 70–99)

## 2018-12-26 MED ORDER — PRAVASTATIN SODIUM 40 MG PO TABS
40.0000 mg | ORAL_TABLET | Freq: Every day | ORAL | Status: DC
  Administered 2018-12-26 – 2019-01-13 (×19): 40 mg via ORAL
  Filled 2018-12-26 (×19): qty 1

## 2018-12-26 MED ORDER — DIPHENHYDRAMINE HCL 12.5 MG/5ML PO ELIX: 12.5000 mg | ORAL_SOLUTION | Freq: Four times a day (QID) | ORAL | Status: DC | PRN

## 2018-12-26 MED ORDER — INSULIN ASPART 100 UNIT/ML ~~LOC~~ SOLN
5.0000 [IU] | Freq: Three times a day (TID) | SUBCUTANEOUS | Status: DC
  Administered 2018-12-26 – 2018-12-29 (×7): 5 [IU] via SUBCUTANEOUS

## 2018-12-26 MED ORDER — DORZOLAMIDE HCL-TIMOLOL MAL 2-0.5 % OP SOLN
1.0000 [drp] | Freq: Two times a day (BID) | OPHTHALMIC | Status: DC
  Administered 2018-12-26 – 2019-01-14 (×32): 1 [drp] via OPHTHALMIC
  Filled 2018-12-26: qty 10

## 2018-12-26 MED ORDER — PROCHLORPERAZINE 25 MG RE SUPP: 12.5000 mg | Freq: Four times a day (QID) | RECTAL | Status: DC | PRN

## 2018-12-26 MED ORDER — FLEET ENEMA 7-19 GM/118ML RE ENEM: 1.0000 | ENEMA | Freq: Once | RECTAL | Status: DC | PRN

## 2018-12-26 MED ORDER — MORPHINE SULFATE (PF) 2 MG/ML IV SOLN: 2.0000 mg | INTRAVENOUS | Status: DC | PRN

## 2018-12-26 MED ORDER — CEFAZOLIN SODIUM-DEXTROSE 1-4 GM/50ML-% IV SOLN
1.0000 g | Freq: Three times a day (TID) | INTRAVENOUS | Status: DC
  Administered 2018-12-26 – 2019-01-05 (×30): 1 g via INTRAVENOUS
  Filled 2018-12-26 (×32): qty 50

## 2018-12-26 MED ORDER — POLYETHYLENE GLYCOL 3350 17 G PO PACK: 17.0000 g | PACK | Freq: Every day | ORAL | 0 refills | Status: DC

## 2018-12-26 MED ORDER — PROCHLORPERAZINE MALEATE 5 MG PO TABS: 5.0000 mg | ORAL_TABLET | Freq: Four times a day (QID) | ORAL | Status: DC | PRN

## 2018-12-26 MED ORDER — PREGABALIN 50 MG PO CAPS: 50.0000 mg | ORAL_CAPSULE | Freq: Every day | ORAL | Status: DC

## 2018-12-26 MED ORDER — BISACODYL 10 MG RE SUPP: 10.0000 mg | Freq: Every day | RECTAL | Status: DC | PRN

## 2018-12-26 MED ORDER — INSULIN ASPART 100 UNIT/ML ~~LOC~~ SOLN
0.0000 [IU] | Freq: Every day | SUBCUTANEOUS | Status: DC
  Administered 2018-12-26 – 2018-12-28 (×3): 3 [IU] via SUBCUTANEOUS
  Administered 2018-12-29 – 2018-12-31 (×2): 2 [IU] via SUBCUTANEOUS

## 2018-12-26 MED ORDER — TRAZODONE HCL 50 MG PO TABS
25.0000 mg | ORAL_TABLET | Freq: Every evening | ORAL | Status: DC | PRN
  Administered 2019-01-07: 50 mg via ORAL
  Filled 2018-12-26 (×3): qty 1

## 2018-12-26 MED ORDER — FERROUS SULFATE 325 (65 FE) MG PO TABS
325.0000 mg | ORAL_TABLET | Freq: Two times a day (BID) | ORAL | Status: DC
  Administered 2018-12-26 – 2019-01-14 (×38): 325 mg via ORAL
  Filled 2018-12-26 (×38): qty 1

## 2018-12-26 MED ORDER — LATANOPROST 0.005 % OP SOLN
1.0000 [drp] | Freq: Every day | OPHTHALMIC | Status: DC
  Administered 2018-12-26 – 2019-01-13 (×19): 1 [drp] via OPHTHALMIC
  Filled 2018-12-26: qty 2.5

## 2018-12-26 MED ORDER — ALUM & MAG HYDROXIDE-SIMETH 200-200-20 MG/5ML PO SUSP: 30.0000 mL | ORAL | Status: DC | PRN

## 2018-12-26 MED ORDER — ACETAMINOPHEN 325 MG PO TABS
325.0000 mg | ORAL_TABLET | ORAL | Status: DC | PRN
  Administered 2018-12-26 – 2019-01-03 (×3): 650 mg via ORAL
  Administered 2019-01-05: 325 mg via ORAL
  Filled 2018-12-26 (×6): qty 2

## 2018-12-26 MED ORDER — HYDROCODONE-ACETAMINOPHEN 5-325 MG PO TABS: 1.0000 | ORAL_TABLET | Freq: Four times a day (QID) | ORAL | 0 refills | Status: DC | PRN

## 2018-12-26 MED ORDER — GUAIFENESIN-DM 100-10 MG/5ML PO SYRP: 5.0000 mL | ORAL_SOLUTION | Freq: Four times a day (QID) | ORAL | Status: DC | PRN

## 2018-12-26 MED ORDER — PREGABALIN 50 MG PO CAPS
50.0000 mg | ORAL_CAPSULE | Freq: Every day | ORAL | Status: DC
  Administered 2018-12-26 – 2019-01-13 (×19): 50 mg via ORAL
  Filled 2018-12-26 (×19): qty 1

## 2018-12-26 MED ORDER — GLUCERNA SHAKE PO LIQD
237.0000 mL | Freq: Three times a day (TID) | ORAL | Status: DC
  Administered 2018-12-26 – 2019-01-14 (×55): 237 mL via ORAL

## 2018-12-26 MED ORDER — BISACODYL 5 MG PO TBEC: 5.0000 mg | DELAYED_RELEASE_TABLET | Freq: Two times a day (BID) | ORAL | 0 refills | Status: DC | PRN

## 2018-12-26 MED ORDER — POLYETHYLENE GLYCOL 3350 17 G PO PACK
17.0000 g | PACK | Freq: Every day | ORAL | Status: DC | PRN
  Administered 2018-12-28: 17 g via ORAL

## 2018-12-26 MED ORDER — POLYETHYLENE GLYCOL 3350 17 G PO PACK
17.0000 g | PACK | Freq: Every day | ORAL | Status: DC
  Administered 2018-12-27 – 2018-12-28 (×2): 17 g via ORAL
  Filled 2018-12-26 (×16): qty 1

## 2018-12-26 MED ORDER — OXYCODONE-ACETAMINOPHEN 5-325 MG PO TABS
1.0000 | ORAL_TABLET | ORAL | Status: DC | PRN
  Administered 2018-12-26 – 2018-12-29 (×6): 1 via ORAL
  Filled 2018-12-26 (×7): qty 1

## 2018-12-26 MED ORDER — INSULIN ASPART 100 UNIT/ML ~~LOC~~ SOLN
0.0000 [IU] | Freq: Three times a day (TID) | SUBCUTANEOUS | Status: DC
  Administered 2018-12-26: 3 [IU] via SUBCUTANEOUS
  Administered 2018-12-27: 2 [IU] via SUBCUTANEOUS
  Administered 2018-12-27: 11 [IU] via SUBCUTANEOUS
  Administered 2018-12-28: 8 [IU] via SUBCUTANEOUS
  Administered 2018-12-28: 2 [IU] via SUBCUTANEOUS
  Administered 2018-12-28: 8 [IU] via SUBCUTANEOUS
  Administered 2018-12-29: 5 [IU] via SUBCUTANEOUS
  Administered 2018-12-29 (×2): 3 [IU] via SUBCUTANEOUS
  Administered 2018-12-30: 5 [IU] via SUBCUTANEOUS
  Administered 2018-12-30 (×2): 3 [IU] via SUBCUTANEOUS
  Administered 2018-12-31 (×2): 2 [IU] via SUBCUTANEOUS
  Administered 2018-12-31 – 2019-01-01 (×2): 5 [IU] via SUBCUTANEOUS
  Administered 2019-01-01: 3 [IU] via SUBCUTANEOUS
  Administered 2019-01-02: 2 [IU] via SUBCUTANEOUS
  Administered 2019-01-02 – 2019-01-04 (×6): 3 [IU] via SUBCUTANEOUS
  Administered 2019-01-04 – 2019-01-06 (×3): 2 [IU] via SUBCUTANEOUS
  Administered 2019-01-08: 3 [IU] via SUBCUTANEOUS
  Administered 2019-01-09 (×2): 2 [IU] via SUBCUTANEOUS
  Administered 2019-01-10: 8 [IU] via SUBCUTANEOUS
  Administered 2019-01-10 – 2019-01-11 (×3): 2 [IU] via SUBCUTANEOUS
  Administered 2019-01-12: 3 [IU] via SUBCUTANEOUS
  Administered 2019-01-13: 2 [IU] via SUBCUTANEOUS

## 2018-12-26 MED ORDER — INSULIN GLARGINE 100 UNIT/ML ~~LOC~~ SOLN
30.0000 [IU] | Freq: Every day | SUBCUTANEOUS | Status: DC
  Administered 2018-12-26 – 2019-01-13 (×18): 30 [IU] via SUBCUTANEOUS
  Filled 2018-12-26 (×20): qty 0.3

## 2018-12-26 MED ORDER — INSULIN GLARGINE 100 UNIT/ML ~~LOC~~ SOLN: 30.0000 [IU] | Freq: Every day | SUBCUTANEOUS | 11 refills | Status: DC

## 2018-12-26 MED ORDER — GLUCERNA SHAKE PO LIQD
237.0000 mL | Freq: Three times a day (TID) | ORAL | Status: DC
  Administered 2018-12-26: 237 mL via ORAL

## 2018-12-26 MED ORDER — PROCHLORPERAZINE EDISYLATE 10 MG/2ML IJ SOLN: 5.0000 mg | Freq: Four times a day (QID) | INTRAMUSCULAR | Status: DC | PRN

## 2018-12-26 MED ORDER — HEPARIN SODIUM (PORCINE) 5000 UNIT/ML IJ SOLN
5000.0000 [IU] | Freq: Three times a day (TID) | INTRAMUSCULAR | Status: DC
  Administered 2018-12-26 – 2019-01-01 (×17): 5000 [IU] via SUBCUTANEOUS
  Filled 2018-12-26 (×17): qty 1

## 2018-12-26 MED ORDER — PRO-STAT SUGAR FREE PO LIQD
30.0000 mL | Freq: Two times a day (BID) | ORAL | Status: DC
  Administered 2018-12-26 – 2019-01-14 (×38): 30 mL via ORAL
  Filled 2018-12-26 (×38): qty 30

## 2018-12-26 MED ORDER — AMOXICILLIN-POT CLAVULANATE 875-125 MG PO TABS: 1.0000 | ORAL_TABLET | Freq: Two times a day (BID) | ORAL | 0 refills | Status: DC

## 2018-12-26 MED ORDER — COLCHICINE 0.6 MG PO TABS
0.6000 mg | ORAL_TABLET | Freq: Two times a day (BID) | ORAL | Status: DC
  Administered 2018-12-26 – 2019-01-14 (×38): 0.6 mg via ORAL
  Filled 2018-12-26 (×38): qty 1

## 2018-12-26 NOTE — IPOC Note (Addendum)
Individualized overall Plan of Care [facility]) Patient Details Name: [name] MRN: [id number] DOB: 1950-08-13  Admitting Diagnosis: [facility] Problems: Active Problems:   Debility   Hypoalbuminemia due to protein-calorie malnutrition ([place])     Functional Problem List: Nursing Edema, Endurance, Pain, Medication Management, Safety  PT Balance, Sensory, Edema, Endurance, Motor, Pain  OT Balance, Edema, Endurance, Motor, Pain, Sensory, Safety, Skin Integrity  SLP    TR         Basic ADL's: OT Grooming, Bathing, Dressing, Toileting     Advanced  ADL's: OT       Transfers: PT Bed Mobility, Bed to Chair, Car, Manufacturing systems engineer, Metallurgist: PT Ambulation, Emergency planning/management officer, Stairs     Additional Impairments: OT None  SLP        TR      Anticipated Outcomes Item Anticipated Outcome  Self Feeding no goal  Swallowing      Basic self-care  MOD I grooming; set up UB dressing; S LB dressing  Toileting  S   Bathroom Transfers S  Bowel/Bladder  n/a  Transfers  supervision bed<>chair transfers  Locomotion  min assist short distances using LRAD  Communication     Cognition     Pain  manage pain at or below level 4  Safety/Judgment  maintain safety with cues/reminders   Therapy Plan: PT Intensity: Minimum of 1-2 x/day ,45 to 90 minutes PT Frequency: 5 out of 7 days PT Duration Estimated Length of Stay: 18-21 days OT Intensity: Minimum of 1-2 x/day, 45 to 90 minutes OT Frequency: 5 out of 7 days OT Duration/Estimated Length of Stay: 18-21      Team Interventions: Nursing Interventions Patient/Family Education, Pain Management, Medication Management, Discharge Planning, Skin Care/Wound Management, Disease Management/Prevention  PT interventions Ambulation/gait training, Community reintegration, DME/adaptive equipment instruction, Neuromuscular re-education, Psychosocial support, Stair training, UE/LE Strength taining/ROM,  Wheelchair propulsion/positioning, Training and development officer, Discharge planning, Functional electrical stimulation, Pain management, Skin care/wound management, Therapeutic Activities, UE/LE Coordination activities, Cognitive remediation/compensation, Functional mobility training, Disease management/prevention, Patient/family education, Splinting/orthotics, Therapeutic Exercise, Visual/perceptual remediation/compensation  OT Interventions Balance/vestibular training, Discharge planning, Pain management, Self Care/advanced ADL retraining, Therapeutic Activities, UE/LE Coordination activities, Disease mangement/prevention, Functional mobility training, Patient/family education, Skin care/wound managment, Therapeutic Exercise, Community reintegration, DME/adaptive equipment instruction, Neuromuscular re-education, Psychosocial support, Splinting/orthotics, UE/LE Strength taining/ROM, Wheelchair propulsion/positioning  SLP Interventions    TR Interventions    SW/CM Interventions  Psychosocial Assessment, Discharge Planning and Pt & Family Education   Barriers to Discharge MD  Medical stability, Wound care and Weight  Nursing      PT Wound Care    OT Wound Care    SLP      SW       Team Discharge Planning: Destination: PT-Home ,OT- Home , SLP-  Projected Follow-up: PT-Home health PT, 24 hour supervision/assistance, OT-  Home health OT, SLP-  Projected Equipment Needs: PT-To be determined, OT- 3 in 1 bedside comode, To be determined, SLP-  Equipment Details: PT- , OT-  Patient/family involved in discharge planning: PT- Patient,  OT-Patient, SLP-   MD ELOS: 18-22 days. 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 Patient reported weakness progressive weakness x3  weeks with anorexia and difficulty walking. He was started on IV antibiotics for sepsis  due to foot infection, treated with one unit PRBC for hgb 6.7and with fluid resuscitation for acute on chronic renal failure with hyponatremia. Xrays done revealing subcutaneous gas and no acute bony abnormality.He was taken to the OR for washout of left lower extremity wounds on 6/28 and6/30 by[facility] and post op wet-to-dry dressings recommended.. Wound culture showed abundant group B strep agalactiae.Follow-up CBC shows downward trend in H&Hto 6.9/21.4 againand transfused with 2 unitPRBC on 7/2--low grade fevers since transfusion question due to reaction?Acute on chronic renal failure has improved and HCTZ DC'd. Blood sugars noted to be labile and Lantus being titrated upwards for tighter control. Patient with resulting functional deficits with mobility, endurance, self-care.  Will set goals for Supervision for most tasks with PT/OT.   Due to the current state of emergency, patients may not be receiving their 3-hours of Medicare-mandated therapy.  See Team Conference Notes for weekly updates to the plan of care

## 2018-12-26 NOTE — Progress Notes (Signed)
I spoke with [name]'s spouse, Ms. Karmello Abercrombie. Update given, all questions answered. She's appreciated for the update calling. [name] has low grade of temperature tonight 100.2 Farenheit. He complained having pain left lower leg. Pain med given, [name]'s able to sleep and tolerated well. No acute distress noted. Continue to monitor  Kennyth Lose, RN
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
## 2018-12-26 NOTE — Progress Notes (Signed)
Dressing on Pt's left leg was changed. Pt tolerated well. Wound packed with 0.9% NSS moist to dry gauzes 4x4 dressing, covered with ABD pad, Kirlex  And elastic bandage. He had minimal bright red blood drainage from the proximal wound. Overall opened wounds looked red, progressing stage of healing and good blood supply, 2+ pitting edema on left and right legs. Pt tolerated well. Continue to monitor.  Kennyth Lose, RN

## 2018-12-26 NOTE — TOC Transition Note (Addendum)
Transition of Care [facility]) - CM/SW Discharge Note [name] RN, BSN Transitions of Care Unit 4E- RN Case Manager [phone]  Patient Details  Name: [name] MRN: [id number] Date of Birth: 12/14/1950  Transition of Care [facility]) CM/SW Contact:  Dawayne Patricia, RN Phone Number: 12/26/2018, 2:17 PM   Clinical Narrative:    Pt admitted with sepsis, s/p I&D, PTA pt was active with [facility] for RN/PT. Pt stable for transition to IP rehab today, pt has insurance auth and bed available for discharge today per [facility] with [facility]. Notified Hoyle Sauer with [facility] of transition plan to [facility].    Final next level of care: IP Rehab Facility Barriers to Discharge: No Barriers Identified   Patient Goals and CMS Choice Patient states their goals for this hospitalization and ongoing recovery are:: rehab CMS Medicare.gov Compare Post Acute Care list provided to:: Patient Choice offered to / list presented to : Patient  Discharge Placement  [facility] IP rehab                     Discharge Plan and Services                DME Arranged: N/A DME Agency: NA       HH Arranged: NA HH Agency: NA        Social Determinants of Health ([place]) Interventions     Readmission Risk Interventions Readmission Risk Prevention Plan 05/09/2018 05/09/2018  Transportation Screening - Complete  PCP or Specialist Appt within 5-7 Days Complete -  Home Care Screening Complete -  Medication Review (RN CM) Complete -  Some recent data might be hidden

## 2018-12-26 NOTE — Progress Notes (Signed)
Admit to unit, oriented to routine, therapy schedule, medications and plan of care. Margarito Liner

## 2018-12-26 NOTE — Discharge Summary (Signed)
Physician Discharge Summary  [name] JTT:[id number] DOB: 02-19-1951 DOA: 12/19/2018  PCP: [name], MD  Admit date: 12/19/2018 Discharge date: 12/26/2018  Admitted From: Home Disposition: CIR  Recommendations for Outpatient Follow-up:  1. Follow up with PCP in 1-2 weeks 2. Please obtain BMP/CBC in one week  Discharge Condition: Stable CODE STATUS: Full  Brief/Interim Summary: [name] is an 68 y.o.malewith past medical history significant for IDDM on insulin pump diagnosed 2005, CKD 3, hypertension, severe peripheral vascular disease on Xarelto status post left leg stenting with stent stenosis, s/p subsequent left femoral anterior tibial bypass and multiple left toe amputations, who developed a wound on his left leg near his ankle that he has been taking care of at home. He states that about 2- 3 wks ago a separate swelling was noted behind his knee which eventually increased in size, and began to drain blood and pus. He was sent from the vascular surgery office. Noted to have BP 79/52, Hb of 6.7, sodium 128, Bun 50, Cr 2.20, Lactic acid 3.2.  Patient admitted as above with severe PVD of the left leg quiring multiple I&D on 6/26, 6/28 and 7/2.,  She continues on Ancef per ID recommendations, patient tolerated procedures quite well, wounds appear to be well-healing by vascular, patient had some acute blood loss anemia on chronic anemia of chronic disease requiring transfusion x2.  Otherwise now stable, pain more well controlled, PT evaluating recommending CIR for disposition given his ongoing needs, patient has not been accepted and is stable for transfer to CIR at this time. Continue antibiotics transition from ancef to augmenting per ID total of 10 days to 14 days pending on wound healing progress. Vascular and wound care to continue following recommending twice daily wet-to-dry dressings vascular surgery; continue elevation.  Resume patient's Xarelto tomorrow on 12/27/2018, the 48  hours postop at that time and likely stable for reinitiation of anticoagulation.  Discharge Diagnoses:  Principal Problem:   Severe sepsis ([place]) Active Problems:   IDDM (insulin dependent diabetes mellitus) (HCC)   HTN (hypertension)   Benign essential HTN   Acute blood loss anemia   Diabetic foot ulcer (HCC)   CKD (chronic kidney disease) stage 3, GFR 30-59 ml/min (HCC)   PVD (peripheral vascular disease) (HCC)   Acute on chronic renal failure (HCC)   Hyponatremia   History of DVT (deep vein thrombosis)  Severe sepsis 2/2 wound on left leg Severe PVD of left leg -his leg wounds have been managed as outpt by vascular surgery -S/P I and D on 6/26; 6/28/ and 7/2 by vascular surgery with noted tunneling of abscess into calf -Blood cultures NGTD -Wound cultures- moderate gr + cocci on gram stain- results show abundant group B strep  -DC Ancef -> augmenting to complete 10-14 days total therapy per ID - to end July 9/10th -Resume Xarelto 12/27/2018- it was started in the fall by Dr [name] for severe PVD  Acute blood loss anemia superimposed on anemia of chronic disease/iron deficiency anemia, stable -Hb 8.7 this morning - s/p 2 uPRBC's since admission -Baseline appears to be 8.2- he has had bleeding form his leg wound which may be the cause -microcytosis noted - Iron saturation is low normal and he could benefit from Iron replacement orally which has been started  Acute on questionable chronic renal failure - previously reported CKD3, resolved Concurrent Hyponatremia -due to dehydration in setting of HCTZ and poor oral intake at home-   hold HCTZ -Cr 2.2 on admit -  currently 0.9 - baseline Cr ~ 1.2-1.7   IDDM (insulin dependent diabetes mellitus)  - on insulin pump at home- cont Lantus and SSI for now  - last A1c 6.5 in 06/28/18 -Continue Lantus 30u BID - continue current meal time regimen  Hypotension with h/o HTN (hypertension), resolving - on Coreg, Maxzide and Losartan at  home - Hold Maxzide and Losartan due to AKI, dehydration - holding Coreg as BP running low  Gout, no acute issues - cont Allopurinol & Colchicine     Discharge Instructions Complete antibiotics as discussed, continue to work with PT to ensure improved range of motion and ambulatory function, follow-up labs and wound care per primary team with twice daily dressing changes per wound care and vascular surgery.  Monitor wound closely for worsening pain, erythema, infection.  Allergies as of 12/26/2018   No Known Allergies     Medication List    STOP taking these medications   carvedilol 12.5 MG tablet Commonly known as: COREG   oxyCODONE-acetaminophen 5-325 MG tablet Commonly known as: PERCOCET/ROXICET   triamterene-hydrochlorothiazide 37.5-25 MG tablet Commonly known as: MAXZIDE-25     TAKE these medications   allopurinol 100 MG tablet Commonly known as: ZYLOPRIM Take 100 mg by mouth daily.   amoxicillin-clavulanate 875-125 MG tablet Commonly known as: Augmentin Take 1 tablet by mouth 2 (two) times daily for 6 days.   bisacodyl 5 MG EC tablet Commonly known as: DULCOLAX Take 1 tablet (5 mg total) by mouth 2 (two) times daily as needed for moderate constipation.   clobetasol cream 0.05 % Commonly known as: TEMOVATE Apply 1 application topically 2 (two) times daily as needed (rash).   clopidogrel 75 MG tablet Commonly known as: PLAVIX Take 1 tablet (75 mg total) by mouth daily.   colchicine 0.6 MG tablet Take 0.6 mg by mouth 2 (two) times daily.   dorzolamide-timolol 22.3-6.8 MG/ML ophthalmic solution Commonly known as: COSOPT Place 1 drop into both eyes 2 (two) times daily.   EQL MEGA SELECT MENS PO Take 1 tablet by mouth daily.   HYDROcodone-acetaminophen 5-325 MG tablet Commonly known as: Norco Take 1 tablet by mouth every 6 (six) hours as needed for up to 10 doses for severe pain. What changed: reasons to take this   insulin aspart 100 UNIT/ML  injection Commonly known as: novoLOG Inject 0-15 Units into the skin 3 (three) times daily with meals. For glucose 120-150 please use 2 units, for 151 to 200 use 3 units, for 201 to 250 use 5 units, for 251 to 300 use 8 units, for 301 to 350 use 12 units, for 351 or greater use 15 units.   insulin glargine 100 UNIT/ML injection Commonly known as: LANTUS Inject 0.3 mLs (30 Units total) into the skin at bedtime. What changed: how much to take   losartan 100 MG tablet Commonly known as: COZAAR Take 100 mg by mouth daily at 2 PM.   metFORMIN 1000 MG tablet Commonly known as: GLUCOPHAGE Take 1 tablet (1,000 mg total) by mouth 2 (two) times daily with a meal. What changed: when to take this   pantoprazole 40 MG tablet Commonly known as: PROTONIX Take 1 tablet (40 mg total) by mouth daily for 15 days.   polyethylene glycol 17 g packet Commonly known as: MIRALAX / GLYCOLAX Take 17 g by mouth daily. Start taking on: December 27, 2018   pravastatin 40 MG tablet Commonly known as: PRAVACHOL Take 40 mg by mouth at bedtime.   pregabalin 50  MG capsule Commonly known as: LYRICA Take 50-100 mg by mouth at bedtime.   rivaroxaban 20 MG Tabs tablet Commonly known as: XARELTO Take 1 tablet (20 mg total) by mouth daily with supper for 30 days. Please start in 07/25/2018 What changed:   additional instructions  Another medication with the same name was removed. Continue taking this medication, and follow the directions you see here.   Travatan Z 0.004 % Soln ophthalmic solution Generic drug: Travoprost (BAK Free) Place 1 drop into both eyes at bedtime.   V-Go 30 Kit See admin instructions. Uses Novolog insulin in pump   Victoza 18 MG/3ML Sopn Generic drug: liraglutide Inject 0.2 mLs (1.2 mg total) into the skin daily. Inject once daily at the same time What changed: when to take this       No Known Allergies  Consultations:  Vascular surgery  Procedures/Studies: Dg Chest Port 1  View  Result Date: 12/19/2018 CLINICAL DATA:  Pt states no chest pains, he is here with a 37 week old wound on his left tibia, 1 proximal, 1 distil which is quite large, both are medial and oozing, hx diabetes, htn, non smoker EXAM: PORTABLE CHEST - 1 VIEW COMPARISON:  04/29/2018 FINDINGS: Lungs are clear. Heart size and mediastinal contours are within normal limits. Aortic Atherosclerosis (ICD10-170.0). No effusion. Visualized bones unremarkable. IMPRESSION: No acute cardiopulmonary disease. Electronically Signed   By: Lucrezia Europe M.D.   On: 12/19/2018 11:23   Dg Tibia/fibula Left Port  Result Date: 12/19/2018 CLINICAL DATA:  Pt states no chest pains, he is here with a 75 week old wound on his left tibia, 1 proximal, 1 distil which is quite large, both are medial and oozing, hx diabetes, htn, non smoker EXAM: PORTABLE LEFT TIBIA AND FIBULA - 2 VIEW COMPARISON:  None. FINDINGS: Subcutaneous gas in the medial proximal calf and distal thigh. Overlapping vascular stents in the visualized distal SFA and popliteal artery across the knee. Surgical clips in the medial and lower calf. Patchy popliteal and tibial arterial calcifications. Regional bones unremarkable. No focal demineralization. IMPRESSION: 1. Subcutaneous gas medially in the distal thigh and proximal calf. 2. Arterial stents and atheromatous calcifications. 3. No acute bone abnormality. Electronically Signed   By: Lucrezia Europe M.D.   On: 12/19/2018 11:26    Subjective: No acute issues or events overnight, tolerating p.o. well, denies chest pain, shortness of breath, nausea, vomiting, diarrhea, constipation, headache, fevers, chills.  Discharge Exam: Vitals:   12/26/18 0511 12/26/18 0834  BP:  (!) 142/81  Pulse: 76 75  Resp:    Temp:  99.5 F (37.5 C)  SpO2: 97%    Vitals:   12/25/18 2235 12/26/18 0505 12/26/18 0511 12/26/18 0834  BP:  122/80  (!) 142/81  Pulse:  73 76 75  Resp:  16    Temp: 98.7 F (37.1 C) 99.5 F (37.5 C)  99.5 F  (37.5 C)  TempSrc: Oral Oral  Oral  SpO2:  98% 97%   Weight:   101.4 kg   Height:        General exam:Appears comfortable, no acute distress, nontoxic in appearance HEENT:PERRLA, oral mucosa moist, no sclera icterus or thrush Respiratory system: Clear to auscultation. Respiratory effort normal. Cardiovascular system:S1 &S2 heard, No murmurs  Gastrointestinal system:Abdomen soft, non-tender, nondistended. Normal bowel sounds   Central nervous system:Alert and oriented. No focal neurological deficits. Extremities: No cyanosis, clubbing - edema of left foot noted left knee bandage clean dry intact Psychiatry:Mood &affect appropriate. Marland Kitchen  The results of significant diagnostics from this hospitalization (including imaging, microbiology, ancillary and laboratory) are listed below for reference.     Microbiology: Recent Results (from the past 240 hour(s))  Blood Culture (routine x 2)     Status: None   Collection Time: 12/19/18 10:36 AM   Specimen: BLOOD  Result Value Ref Range Status   Specimen Description BLOOD BLOOD LEFT HAND  Final   Special Requests   Final    BOTTLES DRAWN AEROBIC AND ANAEROBIC Blood Culture adequate volume   Culture   Final    NO GROWTH 5 DAYS Performed at [facility], [address] [place], [place] [address]    Report Status 12/24/2018 FINAL  Final  Blood Culture (routine x 2)     Status: None   Collection Time: 12/19/18 10:48 AM   Specimen: BLOOD  Result Value Ref Range Status   Specimen Description BLOOD RIGHT ANTECUBITAL  Final   Special Requests   Final    BOTTLES DRAWN AEROBIC AND ANAEROBIC Blood Culture adequate volume   Culture   Final    NO GROWTH 5 DAYS Performed at [facility], [place] [address], [place], [place] [address]    Report Status 12/24/2018 FINAL  Final  SARS Coronavirus 2 (CEPHEID - Performed in [facility] hospital lab), Hosp Order     Status: None   Collection Time: 12/19/18 10:56 AM   Specimen:  Nasopharyngeal Swab  Result Value Ref Range Status   SARS Coronavirus 2 NEGATIVE NEGATIVE Final    Comment: (NOTE) If result is NEGATIVE SARS-CoV-2 target nucleic acids are NOT DETECTED. The SARS-CoV-2 RNA is generally detectable in upper and lower  respiratory specimens during the acute phase of infection. The lowest  concentration of SARS-CoV-2 viral copies this assay can detect is 250  copies / mL. A negative result does not preclude SARS-CoV-2 infection  and should not be used as the sole basis for treatment or other  patient management decisions.  A negative result may occur with  improper specimen collection / handling, submission of specimen other  than nasopharyngeal swab, presence of viral mutation(s) within the  areas targeted by this assay, and inadequate number of viral copies  (<250 copies / mL). A negative result must be combined with clinical  observations, patient history, and epidemiological information. If result is POSITIVE SARS-CoV-2 target nucleic acids are DETECTED. The SARS-CoV-2 RNA is generally detectable in upper and lower  respiratory specimens dur ing the acute phase of infection.  Positive  results are indicative of active infection with SARS-CoV-2.  Clinical  correlation with patient history and other diagnostic information is  necessary to determine patient infection status.  Positive results do  not rule out bacterial infection or co-infection with other viruses. If result is PRESUMPTIVE POSTIVE SARS-CoV-2 nucleic acids MAY BE PRESENT.   A presumptive positive result was obtained on the submitted specimen  and confirmed on repeat testing.  While 2019 novel coronavirus  (SARS-CoV-2) nucleic acids may be present in the submitted sample  additional confirmatory testing may be necessary for epidemiological  and / or clinical management purposes  to differentiate between  SARS-CoV-2 and other Sarbecovirus currently known to infect humans.  If clinically  indicated additional testing with an alternate test  methodology [phone]) is advised. The SARS-CoV-2 RNA is generally  detectable in upper and lower respiratory sp ecimens during the acute  phase of infection. The expected result is Negative. Fact Sheet for Patients:  StrictlyIdeas.no Fact  Sheet for Healthcare Providers: BankingDealers.co.za This test is not yet approved or cleared by the [place] and has been authorized for detection and/or diagnosis of SARS-CoV-2 by FDA under an Emergency Use Authorization (EUA).  This EUA will remain in effect (meaning this test can be used) for the duration of the COVID-19 declaration under Section 564(b)(1) of the Act, 21 U.S.C. section 360bbb-3(b)(1), unless the authorization is terminated or revoked sooner. Performed at [facility], [place] [address] [place], [place] [address]   Aerobic/Anaerobic Culture (surgical/deep wound)     Status: None   Collection Time: 12/19/18  2:38 PM   Specimen: Wound  Result Value Ref Range Status   Specimen Description WOUND LEFT LOWER LEG  Final   Special Requests PATIENT ON FOLLOWING VANC AND ROCEPHIN  Final   Gram Stain   Final    ABUNDANT WBC PRESENT,BOTH PMN AND MONONUCLEAR MODERATE GRAM POSITIVE COCCI    Culture   Final    ABUNDANT GROUP B STREP(S.AGALACTIAE)ISOLATED TESTING AGAINST S. AGALACTIAE NOT ROUTINELY PERFORMED DUE TO PREDICTABILITY OF AMP/PEN/VAN SUSCEPTIBILITY. NO ANAEROBES ISOLATED Performed at [facility], [place] [address], [place], [place] [address]    Report Status 12/25/2018 FINAL  Final  Surgical pcr screen     Status: Abnormal   Collection Time: 12/21/18 12:04 AM   Specimen: Nasal Mucosa; Nasal Swab  Result Value Ref Range Status   MRSA, PCR POSITIVE (A) NEGATIVE Final    Comment: RESULT CALLED TO, READ BACK BY AND VERIFIED WITH: RN T IRBY @0207  12/21/18 BY S GEZAHEGN    Staphylococcus aureus POSITIVE (A) NEGATIVE  Final    Comment: (NOTE) The Xpert SA Assay (FDA approved for NASAL specimens in patients 26 years of age and older), is one component of a comprehensive surveillance program. It is not intended to diagnose infection nor to guide or monitor treatment. Performed at [facility], [place] [address], [place], [place] [address]   Aerobic/Anaerobic Culture (surgical/deep wound)     Status: None (Preliminary result)   Collection Time: 12/23/18 11:40 AM   Specimen: Wound  Result Value Ref Range Status   Specimen Description WOUND  Final   Special Requests LEFT LEG  Final   Gram Stain   Final    FEW WBC PRESENT, PREDOMINANTLY PMN NO ORGANISMS SEEN    Culture   Final    NO GROWTH 3 DAYS NO ANAEROBES ISOLATED; CULTURE IN PROGRESS FOR 5 DAYS Performed at [facility] [address] [place], [place] [address]    Report Status PENDING  Incomplete     Labs: BNP (last 3 results) No results for input(s): BNP in the last 8760 hours. Basic Metabolic Panel: Recent Labs  Lab 12/22/18 0435 12/23/18 0800 12/24/18 0450 12/25/18 0315 12/26/18 1122  NA 133* 132* 133* 133* 131*  K 4.4 3.9 4.6 4.3 3.7  CL 103 102 99 101 101  CO2 22 22 22 23 22   GLUCOSE 252* 97 291* 253* 197*  BUN 23 21 20 22 17   CREATININE 1.15 1.00 1.13 0.90 0.98  CALCIUM 8.0* 8.1* 8.1* 8.0* 8.0*   Liver Function Tests: No results for input(s): AST, ALT, ALKPHOS, BILITOT, PROT, ALBUMIN in the last 168 hours. No results for input(s): LIPASE, AMYLASE in the last 168 hours. No results for input(s): AMMONIA in the last 168 hours. CBC: Recent Labs  Lab 12/22/18 0435 12/23/18 0800 12/24/18 0450 12/25/18 0315 12/25/18 1513 12/26/18 1122  WBC 9.2 11.2* 11.8* 9.1  --  9.5  HGB 8.1* 8.3* 7.5* 6.9* 9.7* 8.7*  HCT 24.9* 25.6* 23.0* 21.4* 29.3* 26.4*  MCV 79.8* 79.0* 79.3* 79.0*  --  80.0  PLT 387 344 309 313  --  271   Cardiac Enzymes: No results for input(s): CKTOTAL, CKMB, CKMBINDEX, TROPONINI in the last 168  hours. BNP: Invalid input(s): POCBNP CBG: Recent Labs  Lab 12/25/18 1217 12/25/18 1657 12/25/18 2138 12/26/18 0625 12/26/18 1153  GLUCAP 267* 207* 157* 150* 183*   D-Dimer No results for input(s): DDIMER in the last 72 hours. Hgb A1c No results for input(s): HGBA1C in the last 72 hours. Lipid Profile No results for input(s): CHOL, HDL, LDLCALC, TRIG, CHOLHDL, LDLDIRECT in the last 72 hours. Thyroid function studies No results for input(s): TSH, T4TOTAL, T3FREE, THYROIDAB in the last 72 hours.  Invalid input(s): FREET3 Anemia work up No results for input(s): VITAMINB12, FOLATE, FERRITIN, TIBC, IRON, RETICCTPCT in the last 72 hours. Urinalysis    Component Value Date/Time   COLORURINE YELLOW 12/19/2018 2015   APPEARANCEUR CLEAR 12/19/2018 2015   LABSPEC 1.011 12/19/2018 2015   PHURINE 5.0 12/19/2018 2015   GLUCOSEU NEGATIVE 12/19/2018 2015   HGBUR NEGATIVE 12/19/2018 2015   BILIRUBINUR NEGATIVE 12/19/2018 2015   KETONESUR NEGATIVE 12/19/2018 2015   PROTEINUR NEGATIVE 12/19/2018 2015   UROBILINOGEN 0.2 12/01/2012 2135   NITRITE NEGATIVE 12/19/2018 2015   LEUKOCYTESUR NEGATIVE 12/19/2018 2015   Sepsis Labs Invalid input(s): PROCALCITONIN,  WBC,  LACTICIDVEN Microbiology Recent Results (from the past 240 hour(s))  Blood Culture (routine x 2)     Status: None   Collection Time: 12/19/18 10:36 AM   Specimen: BLOOD  Result Value Ref Range Status   Specimen Description BLOOD BLOOD LEFT HAND  Final   Special Requests   Final    BOTTLES DRAWN AEROBIC AND ANAEROBIC Blood Culture adequate volume   Culture   Final    NO GROWTH 5 DAYS Performed at [facility], [address] [place], [place] [address]    Report Status 12/24/2018 FINAL  Final  Blood Culture (routine x 2)     Status: None   Collection Time: 12/19/18 10:48 AM   Specimen: BLOOD  Result Value Ref Range Status   Specimen Description BLOOD RIGHT ANTECUBITAL  Final   Special Requests   Final    BOTTLES  DRAWN AEROBIC AND ANAEROBIC Blood Culture adequate volume   Culture   Final    NO GROWTH 5 DAYS Performed at [facility], [place] [address], [place], [place] [address]    Report Status 12/24/2018 FINAL  Final  SARS Coronavirus 2 (CEPHEID - Performed in [facility] hospital lab), Hosp Order     Status: None   Collection Time: 12/19/18 10:56 AM   Specimen: Nasopharyngeal Swab  Result Value Ref Range Status   SARS Coronavirus 2 NEGATIVE NEGATIVE Final    Comment: (NOTE) If result is NEGATIVE SARS-CoV-2 target nucleic acids are NOT DETECTED. The SARS-CoV-2 RNA is generally detectable in upper and lower  respiratory specimens during the acute phase of infection. The lowest  concentration of SARS-CoV-2 viral copies this assay can detect is 250  copies / mL. A negative result does not preclude SARS-CoV-2 infection  and should not be used as the sole basis for treatment or other  patient management decisions.  A negative result may occur with  improper specimen collection / handling, submission of specimen other  than nasopharyngeal swab, presence of viral mutation(s) within the  areas targeted  by this assay, and inadequate number of viral copies  (<250 copies / mL). A negative result must be combined with clinical  observations, patient history, and epidemiological information. If result is POSITIVE SARS-CoV-2 target nucleic acids are DETECTED. The SARS-CoV-2 RNA is generally detectable in upper and lower  respiratory specimens dur ing the acute phase of infection.  Positive  results are indicative of active infection with SARS-CoV-2.  Clinical  correlation with patient history and other diagnostic information is  necessary to determine patient infection status.  Positive results do  not rule out bacterial infection or co-infection with other viruses. If result is PRESUMPTIVE POSTIVE SARS-CoV-2 nucleic acids MAY BE PRESENT.   A presumptive positive result was obtained on the  submitted specimen  and confirmed on repeat testing.  While 2019 novel coronavirus  (SARS-CoV-2) nucleic acids may be present in the submitted sample  additional confirmatory testing may be necessary for epidemiological  and / or clinical management purposes  to differentiate between  SARS-CoV-2 and other Sarbecovirus currently known to infect humans.  If clinically indicated additional testing with an alternate test  methodology [phone]) is advised. The SARS-CoV-2 RNA is generally  detectable in upper and lower respiratory sp ecimens during the acute  phase of infection. The expected result is Negative. Fact Sheet for Patients:  StrictlyIdeas.no Fact Sheet for Healthcare Providers: BankingDealers.co.za This test is not yet approved or cleared by the [place] FDA and has been authorized for detection and/or diagnosis of SARS-CoV-2 by FDA under an Emergency Use Authorization (EUA).  This EUA will remain in effect (meaning this test can be used) for the duration of the COVID-19 declaration under Section 564(b)(1) of the Act, 21 U.S.C. section 360bbb-3(b)(1), unless the authorization is terminated or revoked sooner. Performed at [facility], [place] [address], [place], [place] [address]   Aerobic/Anaerobic Culture (surgical/deep wound)     Status: None   Collection Time: 12/19/18  2:38 PM   Specimen: Wound  Result Value Ref Range Status   Specimen Description WOUND LEFT LOWER LEG  Final   Special Requests PATIENT ON FOLLOWING VANC AND ROCEPHIN  Final   Gram Stain   Final    ABUNDANT WBC PRESENT,BOTH PMN AND MONONUCLEAR MODERATE GRAM POSITIVE COCCI    Culture   Final    ABUNDANT GROUP B STREP(S.AGALACTIAE)ISOLATED TESTING AGAINST S. AGALACTIAE NOT ROUTINELY PERFORMED DUE TO PREDICTABILITY OF AMP/PEN/VAN SUSCEPTIBILITY. NO ANAEROBES ISOLATED Performed at [facility], [place] [address] [place], [place] [address]    Report  Status 12/25/2018 FINAL  Final  Surgical pcr screen     Status: Abnormal   Collection Time: 12/21/18 12:04 AM   Specimen: Nasal Mucosa; Nasal Swab  Result Value Ref Range Status   MRSA, PCR POSITIVE (A) NEGATIVE Final    Comment: RESULT CALLED TO, READ BACK BY AND VERIFIED WITH: RN T IRBY @0207  12/21/18 BY S GEZAHEGN    Staphylococcus aureus POSITIVE (A) NEGATIVE Final    Comment: (NOTE) The Xpert SA Assay (FDA approved for NASAL specimens in patients 62 years of age and older), is one component of a comprehensive surveillance program. It is not intended to diagnose infection nor to guide or monitor treatment. Performed at [facility], [place] [address] [place], [place] [address]   Aerobic/Anaerobic Culture (surgical/deep wound)     Status: None (Preliminary result)   Collection Time: 12/23/18 11:40 AM   Specimen: Wound  Result Value Ref Range Status   Specimen Description WOUND  Final   Special Requests LEFT LEG  Final   Gram Stain   Final    FEW WBC PRESENT, PREDOMINANTLY PMN NO ORGANISMS SEEN    Culture   Final    NO GROWTH 3 DAYS NO ANAEROBES ISOLATED; CULTURE IN PROGRESS FOR 5 DAYS Performed at [facility], [address] [place], [place] [address]    Report Status PENDING  Incomplete   Time coordinating discharge: Over 30 minutes  SIGNED:  Little Ishikawa, DO [facility] 12/26/2018, 12:32 PM Pager   If 7PM-7AM, please contact night-coverage [email or web address] Password [email or web address]

## 2018-12-26 NOTE — Progress Notes (Signed)
Pt transferred to In Patient rehab , report given to [facility].

## 2018-12-26 NOTE — Progress Notes (Addendum)
[facility]  Subjective  - Doing well over all.   Objective (!) 142/81 75 99.5 F (37.5 C) (Oral) 16 97%  Intake/Output Summary (Last 24 hours) at 12/26/2018 0924 Last data filed at 12/26/2018 0600 Gross per 24 hour  Intake 1402.67 ml  Output 2000 ml  Net -597.33 ml    Left LE wounds with beefy red base and good granulation tissue.  No purulent drainage.  Wet to dry dressings reapplied.   Assessment/Planning: POD #3 irrigation and debridement of left LE wounds/incisions Will start extra protein nutrition with Glucerna shakes TID. Wet to dry dressing changes BID to left leg wounds, elevation when at rest for edema. Tm 100.2-99.5.  Wounds are healing well. Disposition stable for Rehab  [name] 12/26/2018 9:24 AM --  Laboratory Lab Results: Recent Labs    12/24/18 0450 12/25/18 0315 12/25/18 1513  WBC 11.8* 9.1  --   HGB 7.5* 6.9* 9.7*  HCT 23.0* 21.4* 29.3*  PLT 309 313  --    BMET Recent Labs    12/24/18 0450 12/25/18 0315  NA 133* 133*  K 4.6 4.3  CL 99 101  CO2 22 23  GLUCOSE 291* 253*  BUN 20 22  CREATININE 1.13 0.90  CALCIUM 8.1* 8.0*    COAG Lab Results  Component Value Date   INR 0.93 12/01/2012   No results found for: PTT   I have interviewed and examined patient with PA and agree with assessment and plan above.   Homar Weinkauf C. Donzetta Matters, MD [facility] Office: [phone] Pager: [phone]

## 2018-12-27 ENCOUNTER — Inpatient Hospital Stay (HOSPITAL_COMMUNITY): Payer: BC Managed Care – PPO

## 2018-12-27 ENCOUNTER — Inpatient Hospital Stay (HOSPITAL_COMMUNITY): Payer: BC Managed Care – PPO | Admitting: Physical Therapy

## 2018-12-27 DIAGNOSIS — E669 Obesity, unspecified: Secondary | ICD-10-CM

## 2018-12-27 DIAGNOSIS — E871 Hypo-osmolality and hyponatremia: Secondary | ICD-10-CM

## 2018-12-27 DIAGNOSIS — E8809 Other disorders of plasma-protein metabolism, not elsewhere classified: Secondary | ICD-10-CM

## 2018-12-27 DIAGNOSIS — E1169 Type 2 diabetes mellitus with other specified complication: Secondary | ICD-10-CM

## 2018-12-27 DIAGNOSIS — E46 Unspecified protein-calorie malnutrition: Secondary | ICD-10-CM

## 2018-12-27 LAB — GLUCOSE, CAPILLARY
Glucose-Capillary: 143 mg/dL — ABNORMAL HIGH (ref 70–99)
Glucose-Capillary: 225 mg/dL — ABNORMAL HIGH (ref 70–99)
Glucose-Capillary: 326 mg/dL — ABNORMAL HIGH (ref 70–99)
Glucose-Capillary: 259 mg/dL — ABNORMAL HIGH (ref 70–99)

## 2018-12-27 LAB — COMPREHENSIVE METABOLIC PANEL
ALT: 16 U/L (ref 0–44)
AST: 22 U/L (ref 15–41)
Albumin: 1.6 g/dL — ABNORMAL LOW (ref 3.5–5.0)
Alkaline Phosphatase: 87 U/L (ref 38–126)
Anion gap: 8 (ref 5–15)
BUN: 16 mg/dL (ref 8–23)
CO2: 24 mmol/L (ref 22–32)
Calcium: 8.2 mg/dL — ABNORMAL LOW (ref 8.9–10.3)
Chloride: 101 mmol/L (ref 98–111)
Creatinine, Ser: 0.9 mg/dL (ref 0.61–1.24)
GFR calc Af Amer: >60 mL/min (ref >60)
GFR calc non Af Amer: >60 mL/min (ref >60)
Glucose, Bld: 170 mg/dL — ABNORMAL HIGH (ref 70–99)
Potassium: 3.8 mmol/L (ref 3.5–5.1)
Sodium: 133 mmol/L — ABNORMAL LOW (ref 135–145)
Total Bilirubin: 0.6 mg/dL (ref 0.3–1.2)
Total Protein: 5.6 g/dL — ABNORMAL LOW (ref 6.5–8.1)

## 2018-12-27 LAB — CBC WITH DIFFERENTIAL/PLATELET
Abs Immature Granulocytes: 0.1 10*3/uL — ABNORMAL HIGH (ref 0.00–0.07)
Basophils Absolute: 0 10*3/uL (ref 0.0–0.1)
Basophils Relative: 0 %
Eosinophils Absolute: 0.1 10*3/uL (ref 0.0–0.5)
Eosinophils Relative: 1 %
HCT: 27.7 % — ABNORMAL LOW (ref 39.0–52.0)
Hemoglobin: 9.1 g/dL — ABNORMAL LOW (ref 13.0–17.0)
Immature Granulocytes: 1 %
Lymphocytes Relative: 11 %
Lymphs Abs: 1.1 10*3/uL (ref 0.7–4.0)
MCH: 26.1 pg (ref 26.0–34.0)
MCHC: 32.9 g/dL (ref 30.0–36.0)
MCV: 79.4 fL — ABNORMAL LOW (ref 80.0–100.0)
Monocytes Absolute: 0.9 10*3/uL (ref 0.1–1.0)
Monocytes Relative: 9 %
Neutro Abs: 7.6 10*3/uL (ref 1.7–7.7)
Neutrophils Relative %: 78 %
Platelets: 260 10*3/uL (ref 150–400)
RBC: 3.49 MIL/uL — ABNORMAL LOW (ref 4.22–5.81)
RDW: 20.8 % — ABNORMAL HIGH (ref 11.5–15.5)
WBC: 9.8 10*3/uL (ref 4.0–10.5)
nRBC: 0 % (ref 0.0–0.2)
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
## 2018-12-27 NOTE — Progress Notes (Signed)
Occupational Therapy Session Note  Patient Details  Name: [name] MRN: [id number] Date of Birth: 21-Aug-1950  Today's Date: 12/27/2018 OT Individual Time: 3888-2800 OT Individual Time Calculation (min): 55 min    Short Term Goals: Week 1:  OT Short Term Goal 1 (Week 1): Pt will sit to stand with consistent mod A in prep for clothing managment OT Short Term Goal 2 (Week 1): Pt will complete toilet transfer wiht S and LRAD via lateral scoot to decrease burden of care OT Short Term Goal 3 (Week 1): Pt will thread BLE into pants wiht AE PRN and min A OT Short Term Goal 4 (Week 1): Pt will don 1/2 socks wiht AE PRN and min VC for AE instruction  Skilled Therapeutic Interventions/Progress Updates:  1:1. Pt received in bed agreeable to change in schedule and early tx. Pt declines standing today. OT retrieves new leg rest d/t old elevating LE rest not latching. Pt completes lateral scoot transfer with S and VC for hand placement. Pt completes w/c mobility to/from all tx spaces. Pt completes UB therex circuit 2x10-12 with 5# dowel rod for BUE strenghtening required for functional transfers and ADLs. Pt completes practice with reacher to thread BLE into theraband for simulated LB dressing. Pt continues to require MAX A to lift LLE and pull theraband past hips. Exited session with pt seated in bed,e xit alarm on and call light in reach Therapy Documentation Precautions:  Precautions Precautions: Fall Restrictions Weight Bearing Restrictions: No General:   Vital Signs:   Pain: Pain Assessment Pain Scale: 0-10 Pain Score: 8 (reports leg is hurting worse than the past 2 days) Pain Type: Acute pain;Surgical pain Pain Location: Leg Pain Orientation: Anterior;Left Pain Descriptors / Indicators: Stabbing;Throbbing Pain Frequency: Intermittent Pain Onset: On-going Pain Intervention(s): Medication (See eMAR);Rest;Relaxation(RN present and aware and reports recent medication administration -  performed therapy to tolerance) ADL:   Vision   Perception    Praxis Praxis: Intact Exercises:   Other Treatments:     Therapy/Group: Individual Therapy  Tonny Branch 12/27/2018, 1:46 PM

## 2018-12-27 NOTE — Progress Notes (Signed)
[facility] PHYSICAL MEDICINE & REHABILITATION PROGRESS NOTE  Subjective/Complaints: Patient seen sitting up at edge of bed working with therapies.  He states he did not sleep well overnight due to noise.  ROS: Denies CP, shortness of breath, nausea, vomiting, diarrhea.  Objective: Vital Signs: Blood pressure (!) 148/83, pulse 74, temperature 99.1 F (37.3 C), temperature source Oral, resp. rate 16, weight 101.4 kg, SpO2 100 %. No results found. Recent Labs    12/26/18 1122 12/27/18 0507  WBC 9.5 9.8  HGB 8.7* 9.1*  HCT 26.4* 27.7*  PLT 271 260   Recent Labs    12/26/18 1122 12/27/18 0507  NA 131* 133*  K 3.7 3.8  CL 101 101  CO2 22 24  GLUCOSE 197* 170*  BUN 17 16  CREATININE 0.98 0.90  CALCIUM 8.0* 8.2*    Physical Exam: BP (!) 148/83 (BP Location: Right Arm)   Pulse 74   Temp 99.1 F (37.3 C) (Oral)   Resp 16   Wt 101.4 kg Comment: done by NT, Jess  SpO2 100%   BMI 29.49 kg/m  Constitutional: No distress . Vital signs reviewed. HENT: Normocephalic.  Atraumatic. Eyes: EOMI. No discharge. Cardiovascular: No JVD. Respiratory: Normal effort. GI: Non-distended. Musc: Left foot with digit mutations 2-5 Left lower extremity with edema and tenderness. Neurological: He is alert and oriented Motor: Bilateral upper extremity: 4+/5 proximal and distal Right lower extremity: 4-4+/5 proximal distal Left lower extremity: Hip flexion, knee extension 2/5, ankle dorsiflexion 3+/5 (some pain inhibition) Skin: Left lower extremity with dressing C/D/I psychiatric: He has a normal mood and affect. Hisbehavior is normal.Judgmentand thought contentnormal.   Assessment/Plan: 1. Functional deficits secondary to debility which require 3+ hours per day of interdisciplinary therapy in a comprehensive inpatient rehab setting.  Physiatrist is providing close team supervision and 24 hour management of active medical problems listed below.  Physiatrist and rehab team continue  to assess barriers to discharge/monitor patient progress toward functional and medical goals  Care Tool:  Bathing    Body parts bathed by patient: Right arm, Left arm, Chest, Abdomen, Front perineal area, Right upper leg, Left upper leg, Face   Body parts bathed by helper: Buttocks, Right lower leg, Left lower leg     Bathing assist Assist Level: Moderate Assistance - Patient 50 - 74%     Upper Body Dressing/Undressing Upper body dressing   What is the patient wearing?: Pull over shirt    Upper body assist Assist Level: Supervision/Verbal cueing    Lower Body Dressing/Undressing Lower body dressing      What is the patient wearing?: Pants     Lower body assist Assist for lower body dressing: Maximal Assistance - Patient 25 - 49%     Toileting Toileting Toileting Activity did not occur (Clothing management and hygiene only): N/A (no void or bm)  Toileting assist       Transfers Chair/bed transfer  Transfers assist     Chair/bed transfer assist level: Contact Guard/Touching assist     Locomotion Ambulation   Ambulation assist              Walk 10 feet activity   Assist           Walk 50 feet activity   Assist           Walk 150 feet activity   Assist           Walk 10 feet on uneven surface  activity   Assist  Wheelchair     Assist               Wheelchair 50 feet with 2 turns activity    Assist            Wheelchair 150 feet activity     Assist            Medical Problem List and Plan: 1.Functional and mobility deficitssecondary to PAD, sepsis, chronic non-healing wounds LLE requiring wound washout. Pt significantly debilitated  Begin CIR evaluations  Notes reviewed-debility, labs personally reviewed 2. Antithrombotics: -h/o DVT/anticoagulation:Pharmaceutical:Heparin--Xarelto on hold -antiplatelet therapy: Plavix on hold. 3. Pain Management:Continue  Lyrica at bedtime 4. Mood:LCSW to follow for evaluation and support -antipsychotic agents: N/A 5. Neuropsych:this patient iscapable of making decisions onhisown behalf. 6. Skin/Wound Care:Wet-to-dry dressings dailyto LLE. 7. Fluids/Electrolytes/Nutrition:Monitor I's and O's.  Nutritional supplements for wound healing. 8.Strep B wound culture: Continue IV Ancef 9.T2DM: Monitor blood sugars AC at bedtime--continue to titrate Lantus upwards for tighter blood sugar control  Monitor with increased mobility 10 Iron deficiency/Acute on chronic renal failure: Continue to monitor with serial checks.             -iron supplement            Hemoglobin 9.1 on 7/4  Continue to monitor 11.HTN: Hypotension has resolved  Monitor with increased mobility 12.Constipation: ON miralax.  13.  Hyponatremia  Sodium 133 on 7/4  Continue to monitor 14.  Hypoalbuminemia  Supplement initiated on 7/4   LOS: 1 days A FACE TO FACE EVALUATION WAS PERFORMED  Ankit Lorie Phenix 12/27/2018, 2:40 PM
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
## 2018-12-28 DIAGNOSIS — R7309 Other abnormal glucose: Secondary | ICD-10-CM

## 2018-12-28 DIAGNOSIS — R0989 Other specified symptoms and signs involving the circulatory and respiratory systems: Secondary | ICD-10-CM

## 2018-12-28 LAB — BASIC METABOLIC PANEL
Anion gap: 10 (ref 5–15)
BUN: 15 mg/dL (ref 8–23)
CO2: 22 mmol/L (ref 22–32)
Calcium: 8.1 mg/dL — ABNORMAL LOW (ref 8.9–10.3)
Chloride: 98 mmol/L (ref 98–111)
Creatinine, Ser: 0.9 mg/dL (ref 0.61–1.24)
GFR calc Af Amer: >60 mL/min (ref >60)
GFR calc non Af Amer: >60 mL/min (ref >60)
Glucose, Bld: 203 mg/dL — ABNORMAL HIGH (ref 70–99)
Potassium: 3.8 mmol/L (ref 3.5–5.1)
Sodium: 130 mmol/L — ABNORMAL LOW (ref 135–145)

## 2018-12-28 LAB — GLUCOSE, CAPILLARY
Glucose-Capillary: 132 mg/dL — ABNORMAL HIGH (ref 70–99)
Glucose-Capillary: 282 mg/dL — ABNORMAL HIGH (ref 70–99)
Glucose-Capillary: 283 mg/dL — ABNORMAL HIGH (ref 70–99)
Glucose-Capillary: 254 mg/dL — ABNORMAL HIGH (ref 70–99)

## 2018-12-28 LAB — CBC
HCT: 26.1 % — ABNORMAL LOW (ref 39.0–52.0)
Hemoglobin: 8.6 g/dL — ABNORMAL LOW (ref 13.0–17.0)
MCH: 26.6 pg (ref 26.0–34.0)
MCHC: 33 g/dL (ref 30.0–36.0)
MCV: 80.8 fL (ref 80.0–100.0)
Platelets: 234 10*3/uL (ref 150–400)
RBC: 3.23 MIL/uL — ABNORMAL LOW (ref 4.22–5.81)
RDW: 20.6 % — ABNORMAL HIGH (ref 11.5–15.5)
WBC: 10.8 10*3/uL — ABNORMAL HIGH (ref 4.0–10.5)
nRBC: 0 % (ref 0.0–0.2)

## 2018-12-28 LAB — AEROBIC/ANAEROBIC CULTURE W GRAM STAIN (SURGICAL/DEEP WOUND): Culture: NO GROWTH

## 2018-12-28 NOTE — Progress Notes (Signed)
[facility] PHYSICAL MEDICINE & REHABILITATION PROGRESS NOTE  Subjective/Complaints: Patient seen laying in bed this morning.  He states he slept well overnight.  He states he could pursue therapies yesterday.  He is slightly upset that people do not close his door all the way on the way out.  ROS: Denies CP, shortness of breath, nausea, vomiting, diarrhea.  Objective: Vital Signs: Blood pressure 136/78, pulse 67, temperature 98.4 F (36.9 C), temperature source Oral, resp. rate 17, weight 104.6 kg, SpO2 99 %. No results found. Recent Labs    12/27/18 0507 12/28/18 0802  WBC 9.8 10.8*  HGB 9.1* 8.6*  HCT 27.7* 26.1*  PLT 260 234   Recent Labs    12/27/18 0507 12/28/18 0802  NA 133* 130*  K 3.8 3.8  CL 101 98  CO2 24 22  GLUCOSE 170* 203*  BUN 16 15  CREATININE 0.90 0.90  CALCIUM 8.2* 8.1*    Physical Exam: BP 136/78 (BP Location: Right Arm)   Pulse 67   Temp 98.4 F (36.9 C) (Oral)   Resp 17   Wt 104.6 kg   SpO2 99%   BMI 30.42 kg/m  Constitutional: No distress . Vital signs reviewed. HENT: Normocephalic.  Atraumatic. Eyes: EOMI. No discharge. Cardiovascular: No JVD. Respiratory: Normal effort. GI: Non-distended. Musc: Left foot with digit mutations 2-5 Left lower extremity with edema and tenderness, stable. Neurological: He is alert and oriented Motor: Bilateral upper extremity: 4+/5 proximal and distal Right lower extremity: 4/5 proximal distal Left lower extremity: Hip flexion, knee extension 2/5, ankle dorsiflexion 3+/5 (some pain inhibition), unchanged Skin: Left lower extremity C/D/I  psychiatric: He has a normal mood and affect. Hisbehavior is normal.Judgmentand thought contentnormal.   Assessment/Plan: 1. Functional deficits secondary to debility which require 3+ hours per day of interdisciplinary therapy in a comprehensive inpatient rehab setting.  Physiatrist is providing close team supervision and 24 hour management of active medical  problems listed below.  Physiatrist and rehab team continue to assess barriers to discharge/monitor patient progress toward functional and medical goals  Care Tool:  Bathing    Body parts bathed by patient: Right arm, Left arm, Chest, Abdomen, Front perineal area, Right upper leg, Left upper leg, Face   Body parts bathed by helper: Buttocks, Right lower leg, Left lower leg     Bathing assist Assist Level: Moderate Assistance - Patient 50 - 74%     Upper Body Dressing/Undressing Upper body dressing   What is the patient wearing?: Pull over shirt    Upper body assist Assist Level: Supervision/Verbal cueing    Lower Body Dressing/Undressing Lower body dressing      What is the patient wearing?: Pants     Lower body assist Assist for lower body dressing: Maximal Assistance - Patient 25 - 49%     Toileting Toileting Toileting Activity did not occur (Clothing management and hygiene only): N/A (no void or bm)  Toileting assist Assist for toileting: Independent with assistive device     Transfers Chair/bed transfer  Transfers assist     Chair/bed transfer assist level: Contact Guard/Touching assist     Locomotion Ambulation   Ambulation assist   Ambulation activity did not occur: Safety/medical concerns          Walk 10 feet activity   Assist  Walk 10 feet activity did not occur: Safety/medical concerns        Walk 50 feet activity   Assist Walk 50 feet with 2 turns activity did not occur:  Safety/medical concerns         Walk 150 feet activity   Assist Walk 150 feet activity did not occur: Safety/medical concerns         Walk 10 feet on uneven surface  activity   Assist Walk 10 feet on uneven surfaces activity did not occur: Safety/medical concerns         Wheelchair     Assist Will patient use wheelchair at discharge?: Yes Type of Wheelchair: Manual    Wheelchair assist level: Supervision/Verbal cueing, Set up  assist Max wheelchair distance: 130ft    Wheelchair 50 feet with 2 turns activity    Assist        Assist Level: Set up assist, Supervision/Verbal cueing   Wheelchair 150 feet activity     Assist     Assist Level: Supervision/Verbal cueing, Set up assist      Medical Problem List and Plan: 1.Functional and mobility deficitssecondary to PAD, sepsis, chronic non-healing wounds LLE requiring wound washout. Pt significantly debilitated  Continue CIR 2. Antithrombotics: -h/o DVT/anticoagulation:Pharmaceutical:Heparin--Xarelto on hold -antiplatelet therapy: Plavix on hold. 3. Pain Management:Continue Lyrica at bedtime 4. Mood:LCSW to follow for evaluation and support -antipsychotic agents: N/A 5. Neuropsych:this patient iscapable of making decisions onhisown behalf. 6. Skin/Wound Care:Wet-to-dry dressings dailyto LLE. 7. Fluids/Electrolytes/Nutrition:Monitor I's and O's.  Nutritional supplements for wound healing. 8.Strep B wound culture: Continue IV Ancef 9.T2DM: Monitor blood sugars AC at bedtime--continue to titrate Lantus upwards for tighter blood sugar control  Labile on 7/5, monitor for trend, will consider initiating mealtime coverage  Monitor with increased mobility 10 Iron deficiency/Acute on chronic renal failure: Continue to monitor with serial checks.             -iron supplement            Hemoglobin 9.1 on 7/4  Continue to monitor 11.HTN: Hypotension has resolved.  No medications at present  Labile on 7/5  Monitor with increased mobility 12.Constipation: ON miralax.  13.  Hyponatremia  Sodium 133 on 7/4  Continue to monitor 14.  Hypoalbuminemia  Supplement initiated on 7/4   LOS: 2 days A FACE TO FACE EVALUATION WAS PERFORMED  Ankit Lorie Phenix 12/28/2018, 11:19 AM

## 2018-12-29 ENCOUNTER — Inpatient Hospital Stay (HOSPITAL_COMMUNITY): Payer: BC Managed Care – PPO

## 2018-12-29 DIAGNOSIS — D62 Acute posthemorrhagic anemia: Secondary | ICD-10-CM

## 2018-12-29 LAB — BPAM RBC
Blood Product Expiration Date: 202007032359
Blood Product Expiration Date: 202007032359
Blood Product Expiration Date: 202007172359
ISSUE DATE / TIME: 202007020623
ISSUE DATE / TIME: 202007021042
Unit Type and Rh: 5100
Unit Type and Rh: 5100
Unit Type and Rh: 5100

## 2018-12-29 LAB — TYPE AND SCREEN
ABO/RH(D): O POS
Antibody Screen: NEGATIVE
Unit division: 0
Unit division: 0
Unit division: 0

## 2018-12-29 LAB — GLUCOSE, CAPILLARY
Glucose-Capillary: 200 mg/dL — ABNORMAL HIGH (ref 70–99)
Glucose-Capillary: 249 mg/dL — ABNORMAL HIGH (ref 70–99)
Glucose-Capillary: 189 mg/dL — ABNORMAL HIGH (ref 70–99)
Glucose-Capillary: 221 mg/dL — ABNORMAL HIGH (ref 70–99)

## 2018-12-29 LAB — CBC
HCT: 26.6 % — ABNORMAL LOW (ref 39.0–52.0)
Hemoglobin: 8.5 g/dL — ABNORMAL LOW (ref 13.0–17.0)
MCH: 25.9 pg — ABNORMAL LOW (ref 26.0–34.0)
MCHC: 32 g/dL (ref 30.0–36.0)
MCV: 81.1 fL (ref 80.0–100.0)
Platelets: 260 10*3/uL (ref 150–400)
RBC: 3.28 MIL/uL — ABNORMAL LOW (ref 4.22–5.81)
RDW: 20.5 % — ABNORMAL HIGH (ref 11.5–15.5)
WBC: 8.5 10*3/uL (ref 4.0–10.5)
nRBC: 0 % (ref 0.0–0.2)

## 2018-12-29 LAB — BASIC METABOLIC PANEL
Anion gap: 10 (ref 5–15)
BUN: 17 mg/dL (ref 8–23)
CO2: 25 mmol/L (ref 22–32)
Calcium: 8.3 mg/dL — ABNORMAL LOW (ref 8.9–10.3)
Chloride: 99 mmol/L (ref 98–111)
Creatinine, Ser: 1 mg/dL (ref 0.61–1.24)
GFR calc Af Amer: >60 mL/min (ref >60)
GFR calc non Af Amer: >60 mL/min (ref >60)
Glucose, Bld: 209 mg/dL — ABNORMAL HIGH (ref 70–99)
Potassium: 4.4 mmol/L (ref 3.5–5.1)
Sodium: 134 mmol/L — ABNORMAL LOW (ref 135–145)

## 2018-12-29 MED ORDER — TRAMADOL HCL 50 MG PO TABS
50.0000 mg | ORAL_TABLET | Freq: Four times a day (QID) | ORAL | Status: DC | PRN
  Administered 2018-12-29 – 2019-01-11 (×10): 50 mg via ORAL
  Filled 2018-12-29 (×11): qty 1

## 2018-12-29 MED ORDER — INSULIN ASPART 100 UNIT/ML ~~LOC~~ SOLN
9.0000 [IU] | Freq: Three times a day (TID) | SUBCUTANEOUS | Status: DC
  Administered 2018-12-29 – 2019-01-01 (×10): 9 [IU] via SUBCUTANEOUS

## 2018-12-29 MED ORDER — OXYCODONE-ACETAMINOPHEN 5-325 MG PO TABS
1.0000 | ORAL_TABLET | ORAL | Status: DC | PRN
  Administered 2018-12-29 – 2019-01-13 (×18): 1 via ORAL
  Filled 2018-12-29 (×20): qty 1

## 2018-12-29 NOTE — Progress Notes (Signed)
Inpatient Rehabilitation  Patient information reviewed and entered into eRehab system by Aerika Groll M. Yoltzin Barg, M.A., CCC/SLP, PPS Coordinator.  Information including medical coding, functional ability and quality indicators will be reviewed and updated through discharge.    

## 2018-12-29 NOTE — Progress Notes (Signed)
Occupational Therapy Note  Patient Details  Name: [name] MRN: [id number] Date of Birth: 15-Apr-1951  Today's Date: 12/29/2018 OT Missed Time: 36 Minutes Missed Time Reason: Other (comment)(eating lunch)  Pt missed 30 mins skilled OT services.  Pt eating lunch which had just arrived.    Leotis Shames [facility] 12/29/2018, 12:22 PM

## 2018-12-29 NOTE — Care Management Note (Signed)
[facility] Individual Statement of Services  Patient Name:  [name]  Date:  12/29/2018  Welcome to the [facility].  Our goal is to provide you with an individualized program based on your diagnosis and situation, designed to meet your specific needs.  With this comprehensive rehabilitation program, you will be expected to participate in at least 3 hours of rehabilitation therapies Monday-Friday, with modified therapy programming on the weekends.  Your rehabilitation program will include the following services:  Physical Therapy (PT), Occupational Therapy (OT), 24 hour per day rehabilitation nursing, Neuropsychology, Case Management (Social Worker), Rehabilitation Medicine, Nutrition Services and Pharmacy Services  Weekly team conferences will be held on Wednesday to discuss your progress.  Your Social Worker will talk with you frequently to get your input and to update you on team discussions.  Team conferences with you and your family in attendance may also be held.  Expected length of stay: 18-21 days  Overall anticipated outcome: supervision-min assist level  Depending on your progress and recovery, your program may change. Your Social Worker will coordinate services and will keep you informed of any changes. Your Social Worker's name and contact numbers are listed  below.  The following services may also be recommended but are not provided by the [facility] will be made to provide these services after discharge if needed.  Arrangements include referral to agencies that provide these services.  Your insurance has been verified to be:  [place] Your primary doctor is:  Holli Humbles  Pertinent information will be shared with your doctor and your insurance company.  Social Worker:  Ovidio Kin, [place] or ([phone]  Information discussed with and copy given to patient by: Elease Hashimoto, 12/29/2018, 11:36 AM

## 2018-12-29 NOTE — Progress Notes (Signed)
[facility] PHYSICAL MEDICINE & REHABILITATION PROGRESS NOTE  Subjective/Complaints: Patient seen laying in bed this morning.  He states he slept well overnight.  He is concerned that he is not making progress as fast as he would like.  ROS: Denies CP, shortness of breath, nausea, vomiting, diarrhea.  Objective: Vital Signs: Blood pressure 132/76, pulse 67, temperature 98.3 F (36.8 C), temperature source Oral, resp. rate 16, weight 104.4 kg, SpO2 97 %. No results found. Recent Labs    12/28/18 0802 12/29/18 0646  WBC 10.8* 8.5  HGB 8.6* 8.5*  HCT 26.1* 26.6*  PLT 234 260   Recent Labs    12/28/18 0802 12/29/18 0646  NA 130* 134*  K 3.8 4.4  CL 98 99  CO2 22 25  GLUCOSE 203* 209*  BUN 15 17  CREATININE 0.90 1.00  CALCIUM 8.1* 8.3*    Physical Exam: BP 132/76 (BP Location: Right Arm)   Pulse 67   Temp 98.3 F (36.8 C) (Oral)   Resp 16   Wt 104.4 kg   SpO2 97%   BMI 30.37 kg/m  Constitutional: No distress . Vital signs reviewed. HENT: Normocephalic.  Atraumatic. Eyes: EOMI.  No discharge. Cardiovascular: No JVD. Respiratory: Normal effort. GI: Non-distended. Musc: Left foot with digit mutations 2-5 Left lower extremity with edema and tenderness, stable. Neurological: He is alert and oriented Motor: Bilateral upper extremity: 4+/5 proximal and distal Right lower extremity: 4/5 proximal distal Left lower extremity: Hip flexion 3/5, knee extension 2/5, ankle dorsiflexion 3+/5 (some pain inhibition) Skin: Left lower extremity with dressing C/D/I  psychiatric: Slightly anxious.  Assessment/Plan: 1. Functional deficits secondary to debility which require 3+ hours per day of interdisciplinary therapy in a comprehensive inpatient rehab setting.  Physiatrist is providing close team supervision and 24 hour management of active medical problems listed below.  Physiatrist and rehab team continue to assess barriers to discharge/monitor patient progress toward  functional and medical goals  Care Tool:  Bathing    Body parts bathed by patient: Right arm, Left arm, Chest, Abdomen, Front perineal area, Right upper leg, Left upper leg, Face   Body parts bathed by helper: Buttocks, Right lower leg, Left lower leg     Bathing assist Assist Level: Moderate Assistance - Patient 50 - 74%     Upper Body Dressing/Undressing Upper body dressing   What is the patient wearing?: Pull over shirt    Upper body assist Assist Level: Supervision/Verbal cueing    Lower Body Dressing/Undressing Lower body dressing      What is the patient wearing?: Pants     Lower body assist Assist for lower body dressing: Maximal Assistance - Patient 25 - 49%     Toileting Toileting Toileting Activity did not occur (Clothing management and hygiene only): N/A (no void or bm)  Toileting assist Assist for toileting: Independent with assistive device     Transfers Chair/bed transfer  Transfers assist     Chair/bed transfer assist level: Contact Guard/Touching assist     Locomotion Ambulation   Ambulation assist   Ambulation activity did not occur: Safety/medical concerns          Walk 10 feet activity   Assist  Walk 10 feet activity did not occur: Safety/medical concerns        Walk 50 feet activity   Assist Walk 50 feet with 2 turns activity did not occur: Safety/medical concerns         Walk 150 feet activity   Assist Walk 150  feet activity did not occur: Safety/medical concerns         Walk 10 feet on uneven surface  activity   Assist Walk 10 feet on uneven surfaces activity did not occur: Safety/medical concerns         Wheelchair     Assist Will patient use wheelchair at discharge?: Yes Type of Wheelchair: Manual    Wheelchair assist level: Supervision/Verbal cueing, Set up assist Max wheelchair distance: 16ft    Wheelchair 50 feet with 2 turns activity    Assist        Assist Level: Set up  assist, Supervision/Verbal cueing   Wheelchair 150 feet activity     Assist     Assist Level: Supervision/Verbal cueing, Set up assist      Medical Problem List and Plan: 1.Functional and mobility deficitssecondary to PAD, sepsis, chronic non-healing wounds LLE requiring wound washout. Pt significantly debilitated  Continue CIR 2. Antithrombotics: -h/o DVT/anticoagulation:Pharmaceutical:Heparin--Xarelto on hold -antiplatelet therapy: Plavix on hold. 3. Pain Management:Continue Lyrica at bedtime 4. Mood:LCSW to follow for evaluation and support -antipsychotic agents: N/A 5. Neuropsych:this patient iscapable of making decisions onhisown behalf.  Continue to provide positive encouragement 6. Skin/Wound Care:Wet-to-dry dressings dailyto LLE. 7. Fluids/Electrolytes/Nutrition:Monitor I's and O's.  Nutritional supplements for wound healing. 8.Strep B wound culture: Continue IV Ancef 9.T2DM: Monitor blood sugars AC at bedtime--continue to titrate Lantus upwards for tighter blood sugar control  NovoLog increased to 9 TID on 7/6  Labile on 7/6  Monitor with increased mobility 10 Iron deficiency: Continue to monitor with serial checks.             -iron supplement            Hemoglobin 8.5 on 7/6  Continue to monitor 11.HTN: Hypotension has resolved.  No medications at present  Controlled on 7/6  Monitor with increased mobility 12.Constipation: ON miralax.  13.  Hyponatremia  Sodium 134 on 7/6  Continue to monitor 14.  Hypoalbuminemia  Supplement initiated on 7/4   LOS: 3 days A FACE TO FACE EVALUATION WAS PERFORMED  Shirrell Solinger Lorie Phenix 12/29/2018, 9:20 AM

## 2018-12-29 NOTE — Progress Notes (Signed)
Occupational Therapy Session Note  Patient Details  Name: [name] MRN: [id number] Date of Birth: 07-08-1950  Today's Date: 12/29/2018 OT Individual Time: 6681-5947 OT Individual Time Calculation (min): 55 min    Short Term Goals: Week 1:  OT Short Term Goal 1 (Week 1): Pt will sit to stand with consistent mod A in prep for clothing managment OT Short Term Goal 2 (Week 1): Pt will complete toilet transfer wiht S and LRAD via lateral scoot to decrease burden of care OT Short Term Goal 3 (Week 1): Pt will thread BLE into pants wiht AE PRN and min A OT Short Term Goal 4 (Week 1): Pt will don 1/2 socks wiht AE PRN and min VC for AE instruction  Skilled Therapeutic Interventions/Progress Updates:    Pt resting in bed upon arrival.  Pt states he is able to wash up and change clothing seated EOB without any assistance and doesn't need to "work on" those tasks.  Pt expresses concern about LLE and weakness/inability to move. Pt very emotional when describing history of illness/LLE infection.  Pt expresses concern that process is taking longer then anticipated.  Pt receptive to Neuropsych visit and CSW notified. At one point, pt stated that he feels like he just needs to "have a good cry." Emotional support provided.  LLE positioning provided and pt able to perform LLE heel slides which was encouraging to pt. OT intervention with focus on discharge planning, bed mobility, LLE mobility at bed level, emotional support, and activity tolerance to increase independence with BADLs.   Therapy Documentation Precautions:  Precautions Precautions: Fall Restrictions Weight Bearing Restrictions: No   Pain: Pt c/o L knee pain with activity; RN aware and meds admin; repositioned  Therapy/Group: Individual Therapy  Leroy Libman 12/29/2018, 10:56 AM
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
## 2018-12-29 NOTE — Plan of Care (Signed)
  Problem: RH SKIN INTEGRITY Goal: RH STG MAINTAIN SKIN INTEGRITY WITH ASSISTANCE Description: STG Maintain Skin Integrity With min Assistance. Outcome: Progressing   Problem: RH SAFETY Goal: RH STG ADHERE TO SAFETY PRECAUTIONS W/ASSISTANCE/DEVICE Description: STG Adhere to Safety Precautions With cues/reminders Outcome: Progressing   Problem: RH PAIN MANAGEMENT Goal: RH STG PAIN MANAGED AT OR BELOW PT'S PAIN GOAL Description: Manage pain at or below level 4 Outcome: Not Progressing   Problem: RH KNOWLEDGE DEFICIT GENERAL Goal: RH STG INCREASE KNOWLEDGE OF SELF CARE AFTER HOSPITALIZATION Outcome: Progressing

## 2018-12-29 NOTE — Progress Notes (Signed)
Photos of wounds at admission:   thigh wound.   mid tibial wound  lower leg wound

## 2018-12-29 NOTE — Progress Notes (Signed)
Occupational Therapy Session Note  Patient Details  Name: [name] MRN: [id number] Date of Birth: 16-Dec-1950  Today's Date: 12/29/2018 OT Individual Time: 1330-1356 OT Individual Time Calculation (min): 26 min    Short Term Goals: Week 1:  OT Short Term Goal 1 (Week 1): Pt will sit to stand with consistent mod A in prep for clothing managment OT Short Term Goal 2 (Week 1): Pt will complete toilet transfer wiht S and LRAD via lateral scoot to decrease burden of care OT Short Term Goal 3 (Week 1): Pt will thread BLE into pants wiht AE PRN and min A OT Short Term Goal 4 (Week 1): Pt will don 1/2 socks wiht AE PRN and min VC for AE instruction  Skilled Therapeutic Interventions/Progress Updates:    Pt resting in bed upon arrival.  Pt c/o 7/10 pain in LLE. RN admin meds prior to therapy. OT intervention with focus on LLE therex to increase bed mobility and independence with BADLs. See below for exercises. PT c/o increased tenderness above L patella, no swelling noted.  Pt also c/o increased pain on plantar surface of L foot.  Increased edema noted and RN notified. Pt remained in bed with all needs within reach and bed alarm activated.   Therapy Documentation Precautions:  Precautions Precautions: Fall Restrictions Weight Bearing Restrictions: No General: General OT Amount of Missed Time: 30 Minutes Vital Signs:   Pain:   ADL:   Vision   Perception    Praxis   Exercises: General Exercises - Lower Extremity Ankle Circles/Pumps: AROM;Left;5 reps;Supine Quad Sets: AROM;Both;10 reps;Supine Heel Slides: AROM;Left;10 reps;Supine Hip ABduction/ADduction: AROM;Left;10 reps;Supine Other Treatments:     Therapy/Group: Individual Therapy  Leroy Libman 12/29/2018, 1:58 PM
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
## 2018-12-29 NOTE — Progress Notes (Signed)
Physical Therapy Session Note  Patient Details  Name: [name] MRN: [id number] Date of Birth: [birth year]  Today's Date: 12/29/2018 PT Individual Time: 1400-1500 PT Individual Time Calculation (min): 60 min   Short Term Goals: Week 1:  PT Short Term Goal 1 (Week 1): Pt will perform supine<>sit with CGA PT Short Term Goal 2 (Week 1): Pt will perform slide board transfers with supervision PT Short Term Goal 3 (Week 1): Pt will perform sit<>stand transfers with max assist of 1 PT Short Term Goal 4 (Week 1): Pt will initiate gait training  Skilled Therapeutic Interventions/Progress Updates:  Pt resting in bed.  He stated that his pain is 8/10 aching pain, R knee,and lower leg. Premedicated.  Pt stated he needed to use urinal.  Pt independent with it, for continent voiding.   Pt lies in bed in excessive bil hip external rotation, L>R.  Supine- Therapeutic exercises performed with LE to increase strength for functional mobility 2 x 10 cervical flexion, bil hip internal rotation, R unilateral bridging with PT holding weight of LLE, L isometric hip extension on towel roll, 1 x 10 R straight leg raises, focusing on eccentric control, 5 x 2 active assistive L heel slides. Sustained stretch L quadriceps x 2 minutes with doubled towel roll under knee.  PT instructed pt in relaxation imagery and slowing down breathing, for pain control, with fair carryover.  Pt with edema L foot.  4" ACE added to wrap, including foot starting on foot.  Pt left lying in bed with needs at hand and bed alarm set.  Crystal, RN re-dressing LLE wounds .      Therapy Documentation Precautions:  Precautions Precautions: Fall Restrictions Weight Bearing Restrictions: No         Therapy/Group: Individual Therapy  [name] 12/29/2018, 4:29 PM

## 2018-12-30 ENCOUNTER — Inpatient Hospital Stay (HOSPITAL_COMMUNITY): Payer: BC Managed Care – PPO | Admitting: Occupational Therapy

## 2018-12-30 ENCOUNTER — Inpatient Hospital Stay (HOSPITAL_COMMUNITY): Payer: BC Managed Care – PPO

## 2018-12-30 LAB — GLUCOSE, CAPILLARY
Glucose-Capillary: 232 mg/dL — ABNORMAL HIGH (ref 70–99)
Glucose-Capillary: 187 mg/dL — ABNORMAL HIGH (ref 70–99)
Glucose-Capillary: 200 mg/dL — ABNORMAL HIGH (ref 70–99)
Glucose-Capillary: 124 mg/dL — ABNORMAL HIGH (ref 70–99)

## 2018-12-30 LAB — CBC
HCT: 25.7 % — ABNORMAL LOW (ref 39.0–52.0)
Hemoglobin: 8.4 g/dL — ABNORMAL LOW (ref 13.0–17.0)
MCH: 26.4 pg (ref 26.0–34.0)
MCHC: 32.7 g/dL (ref 30.0–36.0)
MCV: 80.8 fL (ref 80.0–100.0)
Platelets: 265 10*3/uL (ref 150–400)
RBC: 3.18 MIL/uL — ABNORMAL LOW (ref 4.22–5.81)
RDW: 20.3 % — ABNORMAL HIGH (ref 11.5–15.5)
WBC: 8.3 10*3/uL (ref 4.0–10.5)
nRBC: 0 % (ref 0.0–0.2)

## 2018-12-30 LAB — BASIC METABOLIC PANEL
Anion gap: 10 (ref 5–15)
BUN: 17 mg/dL (ref 8–23)
CO2: 22 mmol/L (ref 22–32)
Calcium: 8.3 mg/dL — ABNORMAL LOW (ref 8.9–10.3)
Chloride: 97 mmol/L — ABNORMAL LOW (ref 98–111)
Creatinine, Ser: 1.01 mg/dL (ref 0.61–1.24)
GFR calc Af Amer: >60 mL/min (ref >60)
GFR calc non Af Amer: >60 mL/min (ref >60)
Glucose, Bld: 242 mg/dL — ABNORMAL HIGH (ref 70–99)
Potassium: 4.1 mmol/L (ref 3.5–5.1)
Sodium: 129 mmol/L — ABNORMAL LOW (ref 135–145)

## 2018-12-30 NOTE — Progress Notes (Signed)
[place] PHYSICAL MEDICINE & REHABILITATION PROGRESS NOTE  Subjective/Complaints: Patient seen laying in bed this morning.  He states he slept well overnight.  He states he feels better.  ROS: Denies CP, shortness of breath, nausea, vomiting, diarrhea.  Objective: Vital Signs: Blood pressure 128/82, pulse 67, temperature 99.6 F (37.6 C), resp. rate 16, weight 103.2 kg, SpO2 100 %. No results found. Recent Labs    12/29/18 0646 12/30/18 0721  WBC 8.5 8.3  HGB 8.5* 8.4*  HCT 26.6* 25.7*  PLT 260 265   Recent Labs    12/29/18 0646 12/30/18 0721  NA 134* 129*  K 4.4 4.1  CL 99 97*  CO2 25 22  GLUCOSE 209* 242*  BUN 17 17  CREATININE 1.00 1.01  CALCIUM 8.3* 8.3*    Physical Exam: BP 128/82   Pulse 67   Temp 99.6 F (37.6 C)   Resp 16   Wt 103.2 kg   SpO2 100%   BMI 30.02 kg/m  Constitutional: No distress . Vital signs reviewed. HENT: Normocephalic.  Atraumatic. Eyes: EOMI. No discharge. Cardiovascular: No JVD. Respiratory: Normal effort. GI: Non-distended. Musc: Left foot with digit mutations 2-5 Left lower extremity with edema and tenderness, improving Neurological: He is alert and oriented Motor: Bilateral upper extremity: 5/5 proximal and distal Right lower extremity: 4+/5 proximal distal Left lower extremity: Hip flexion 3/5, knee extension 2+/5, ankle dorsiflexion 3+/5 (some pain inhibition) Skin: Left lower extremity with dressing C/D/I (please see media tab) psychiatric: Normal mood.  Normal behavior.  Assessment/Plan: 1. Functional deficits secondary to debility which require 3+ hours per day of interdisciplinary therapy in a comprehensive inpatient rehab setting.  Physiatrist is providing close team supervision and 24 hour management of active medical problems listed below.  Physiatrist and rehab team continue to assess barriers to discharge/monitor patient progress toward functional and medical goals  Care Tool:  Bathing    Body parts  bathed by patient: Right arm, Left arm, Chest, Abdomen, Front perineal area, Right upper leg, Left upper leg, Face   Body parts bathed by helper: Buttocks, Right lower leg, Left lower leg     Bathing assist Assist Level: Moderate Assistance - Patient 50 - 74%     Upper Body Dressing/Undressing Upper body dressing   What is the patient wearing?: Pull over shirt    Upper body assist Assist Level: Supervision/Verbal cueing    Lower Body Dressing/Undressing Lower body dressing      What is the patient wearing?: Pants     Lower body assist Assist for lower body dressing: Maximal Assistance - Patient 25 - 49%     Toileting Toileting    Toileting assist Assist for toileting: Independent with assistive device     Transfers Chair/bed transfer  Transfers assist     Chair/bed transfer assist level: Contact Guard/Touching assist     Locomotion Ambulation   Ambulation assist   Ambulation activity did not occur: Safety/medical concerns          Walk 10 feet activity   Assist  Walk 10 feet activity did not occur: Safety/medical concerns        Walk 50 feet activity   Assist Walk 50 feet with 2 turns activity did not occur: Safety/medical concerns         Walk 150 feet activity   Assist Walk 150 feet activity did not occur: Safety/medical concerns         Walk 10 feet on uneven surface  activity   Assist  Walk 10 feet on uneven surfaces activity did not occur: Safety/medical concerns         Wheelchair     Assist Will patient use wheelchair at discharge?: Yes Type of Wheelchair: Manual    Wheelchair assist level: Supervision/Verbal cueing, Set up assist Max wheelchair distance: 155ft    Wheelchair 50 feet with 2 turns activity    Assist        Assist Level: Set up assist, Supervision/Verbal cueing   Wheelchair 150 feet activity     Assist     Assist Level: Supervision/Verbal cueing, Set up assist      Medical  Problem List and Plan: 1.Functional and mobility deficitssecondary to PAD, sepsis, chronic non-healing wounds LLE requiring wound washout. Pt significantly debilitated  Continue CIR 2. Antithrombotics: -h/o DVT/anticoagulation:Pharmaceutical:Heparin--Xarelto on hold -antiplatelet therapy: Plavix on hold. 3. Pain Management:Continue Lyrica at bedtime 4. Mood:LCSW to follow for evaluation and support -antipsychotic agents: N/A 5. Neuropsych:this patient iscapable of making decisions onhisown behalf.  Continue to provide positive encouragement 6. Skin/Wound Care:Wet-to-dry dressings dailyto LLE. 7. Fluids/Electrolytes/Nutrition:Monitor I's and O's.  Nutritional supplements for wound healing. 8.Strep B wound culture: Continue IV Ancef 9.T2DM: Monitor blood sugars AC at bedtime--continue to titrate Lantus upwards for tighter blood sugar control  NovoLog increased to 9 TID on 7/6  Remains elevated on 7/7  Monitor with increased mobility 10 Iron deficiency: Continue to monitor with serial checks.             -iron supplement            Hemoglobin 8.4 on 7/7  Continue to monitor 11.HTN: Hypotension has resolved.  No medications at present  Controlled on 7/7  Monitor with increased mobility 12.Constipation: ON miralax.  13.  Hyponatremia  Sodium 129 on 7/7  Continue to monitor 14.  Hypoalbuminemia  Supplement initiated on 7/4   LOS: 4 days A FACE TO FACE EVALUATION WAS PERFORMED  [place] Luna Lorie Phenix 12/30/2018, 8:31 AM

## 2018-12-30 NOTE — Plan of Care (Signed)
  Problem: RH SKIN INTEGRITY Goal: RH STG ABLE TO PERFORM INCISION/WOUND CARE W/ASSISTANCE Description: STG Able To Perform Incision/Wound Care With  min Assistance. Outcome: Progressing   Problem: RH SAFETY Goal: RH STG ADHERE TO SAFETY PRECAUTIONS W/ASSISTANCE/DEVICE Description: STG Adhere to Safety Precautions With cues/reminders Outcome: Progressing   Problem: RH PAIN MANAGEMENT Goal: RH STG PAIN MANAGED AT OR BELOW PT'S PAIN GOAL Description: Manage pain at or below level 4 Outcome: Not Progressing   Problem: RH KNOWLEDGE DEFICIT GENERAL Goal: RH STG INCREASE KNOWLEDGE OF SELF CARE AFTER HOSPITALIZATION Outcome: Progressing

## 2018-12-30 NOTE — Progress Notes (Signed)
Physical Therapy Session Note  Patient Details  Name: [name] MRN: [id number] Date of Birth: 30-Apr-1951  Today's Date: 12/30/2018 PT Individual Time: 1130-1230 PT Individual Time Calculation (min): 60 min   Short Term Goals: Week 1:  PT Short Term Goal 1 (Week 1): Pt will perform supine<>sit with CGA PT Short Term Goal 2 (Week 1): Pt will perform slide board transfers with supervision PT Short Term Goal 3 (Week 1): Pt will perform sit<>stand transfers with max assist of 1 PT Short Term Goal 4 (Week 1): Pt will initiate gait training  Skilled Therapeutic Interventions/Progress Updates:     Patient in bed, NT at bedside, upon PT arrival. Patient reporting feeling very cold and requested some blankets and time to warm up prior to starting therapy. PT provided 2 blankets and turned up the thermastat in the room for patient comfort. Patient missed 10 min of skilled PT due to patient refusal/fatigue. PT returned and patient was agreeable to PT session.    Therapeutic Activity: Bed Mobility: Patient performed supine to/from sit with min A for L LE management in a flat bed without use of rails. Provided verbal cues for lifting L LE. Patient required increased time to complete bed mobility. He sat EOB leaning onto his R elbow to off weight his L LE in sitting, required max cues and increased time to sit EOB with erect posture and shifting weight to his L hip. He was able to pull his L foot back underneath him in sitting with increased time. Sitting EOB with cues for upright posture, PT educated on benefits of mobilizing and strengthening L LE to tolerance. Patient declined standing this session, stating that he will not stand until he feels his L leg is doing better. PT educated patient on benefits of weight bearing and increasing mobility for recovery and to prevent further deconditioning. Patient receptive, but continued to decline standing. He then stated he was very fatigued from sitting and  needed to lie down. PT encouraged him to sit and perform 2 sets of exercises prior to lying back down. Patient agreeable.   Therapeutic Exercise: Patient performed the following exercises with verbal and tactile cues for proper technique. -Seated R LAQs 2x10 -B ankle pumps x20 -B heel slides 2x10 (L with AAROM and very limited ROM due to pain) -B SLR 2x10 R and 2x5 L with AAROM -B glut sets 2x10 with 5 sec hold Patient declined to continue with exercises secondary to fatigue. Patient missed 5 min of skilled PT at end of session due to fatigue.   Patient in bed at end of session with breaks locked, bed alarm set, and all needs within reach.    Therapy Documentation Precautions:  Precautions Precautions: Fall Restrictions Weight Bearing Restrictions: No General: PT Amount of Missed Time (min): 15 Minutes PT Missed Treatment Reason: Patient fatigue Pain: Pain Assessment Pain Scale: 0-10 Pain Score: 6/10 Pain Type: Acute pain Pain Location: Leg Pain Orientation: Left Pain Descriptors / Indicators: Aching;Discomfort Pain Frequency: Constant Pain Onset: On-going Pain Intervention(s): Repositioned;Emotional support   Therapy/Group: Individual Therapy  Jailene Cupit L [name] [name] PT, DPT  12/30/2018, 4:10 PM

## 2018-12-30 NOTE — Progress Notes (Signed)
Occupational Therapy Session Note  Patient Details  Name: [name] MRN: [id number] Date of Birth: 01-Apr-1951  Today's Date: 12/30/2018 OT Individual Time: 1345-1425 OT Individual Time Calculation (min): 40 min    Short Term Goals: Week 1:  OT Short Term Goal 1 (Week 1): Pt will sit to stand with consistent mod A in prep for clothing managment OT Short Term Goal 2 (Week 1): Pt will complete toilet transfer wiht S and LRAD via lateral scoot to decrease burden of care OT Short Term Goal 3 (Week 1): Pt will thread BLE into pants wiht AE PRN and min A OT Short Term Goal 4 (Week 1): Pt will don 1/2 socks wiht AE PRN and min VC for AE instruction  Skilled Therapeutic Interventions/Progress Updates:    OT intervention with focus on bed mobility, activity tolerance, and discharge planning.  Pt requires min A for supine>sit EOB with increased pain in LLE with activity. Discussed discharge plans and home setup.  Pt will be able to remain on one level (basement) which is easiest to access.  Pt returned to bed and remained in bed with all needs within reach and bed alarm activated.   Therapy Documentation Precautions:  Precautions Precautions: Fall Restrictions Weight Bearing Restrictions: No   Pain: Pt c/o 7/10 LLE pain; meds admin prior to therapy and repositioned  Therapy/Group: Individual Therapy  Leroy Libman 12/30/2018, 2:44 PM

## 2018-12-30 NOTE — Progress Notes (Signed)
Occupational Therapy Session Note  Patient Details  Name: SKIPPER DACOSTA MRN: [id number] Date of Birth: [birth year]  Today's Date: 12/30/2018 OT Individual Time: 0900-1014 OT Individual Time Calculation (min): 74 min    Short Term Goals: Week 1:  OT Short Term Goal 1 (Week 1): Pt will sit to stand with consistent mod A in prep for clothing managment OT Short Term Goal 2 (Week 1): Pt will complete toilet transfer wiht S and LRAD via lateral scoot to decrease burden of care OT Short Term Goal 3 (Week 1): Pt will thread BLE into pants wiht AE PRN and min A OT Short Term Goal 4 (Week 1): Pt will don 1/2 socks wiht AE PRN and min VC for AE instruction  Skilled Therapeutic Interventions/Progress Updates:    Patient in bed, agreeable to participate in therapy session.  Bed mobility with min A for left LE management.  Sit pivot transfers to/from bed, w/c, mat table and drop arm commode with CGA, cues for positioning and safety.  toileting with set up/cues, he is able to manage clothing on wide seat drop arm commode and hygiene with lateral weight shift.  Completed unsupported sitting core mobility exercises with rest breaks.  Returned to bed position at close of session with min A.  Bed alarm set and call bell in reach.  Therapy Documentation Precautions:  Precautions Precautions: Fall Restrictions Weight Bearing Restrictions: No General:   Vital Signs:   Pain: Pain Assessment Pain Scale: 0-10 Pain Score: 8  Pain Type: Acute pain Pain Location: Leg Pain Orientation: Left Pain Descriptors / Indicators: Shooting Pain Frequency: Constant Pain Onset: On-going Pain Intervention(s): Repositioned Other Treatments:     Therapy/Group: Individual Therapy  Carlos Levering 12/30/2018, 12:06 PM

## 2018-12-31 ENCOUNTER — Inpatient Hospital Stay (HOSPITAL_COMMUNITY): Payer: BC Managed Care – PPO | Admitting: Occupational Therapy

## 2018-12-31 ENCOUNTER — Inpatient Hospital Stay (HOSPITAL_COMMUNITY): Payer: BC Managed Care – PPO

## 2018-12-31 ENCOUNTER — Encounter (HOSPITAL_COMMUNITY): Payer: BC Managed Care – PPO | Admitting: Psychology

## 2018-12-31 DIAGNOSIS — F331 Major depressive disorder, recurrent, moderate: Secondary | ICD-10-CM

## 2018-12-31 DIAGNOSIS — M25562 Pain in left knee: Secondary | ICD-10-CM

## 2018-12-31 LAB — CBC
HCT: 25.7 % — ABNORMAL LOW (ref 39.0–52.0)
Hemoglobin: 8.3 g/dL — ABNORMAL LOW (ref 13.0–17.0)
MCH: 26.1 pg (ref 26.0–34.0)
MCHC: 32.3 g/dL (ref 30.0–36.0)
MCV: 80.8 fL (ref 80.0–100.0)
Platelets: 291 10*3/uL (ref 150–400)
RBC: 3.18 MIL/uL — ABNORMAL LOW (ref 4.22–5.81)
RDW: 20.3 % — ABNORMAL HIGH (ref 11.5–15.5)
WBC: 8.3 10*3/uL (ref 4.0–10.5)
nRBC: 0 % (ref 0.0–0.2)

## 2018-12-31 LAB — BASIC METABOLIC PANEL
Anion gap: 9 (ref 5–15)
BUN: 17 mg/dL (ref 8–23)
CO2: 24 mmol/L (ref 22–32)
Calcium: 8.6 mg/dL — ABNORMAL LOW (ref 8.9–10.3)
Chloride: 97 mmol/L — ABNORMAL LOW (ref 98–111)
Creatinine, Ser: 0.92 mg/dL (ref 0.61–1.24)
GFR calc Af Amer: >60 mL/min (ref >60)
GFR calc non Af Amer: >60 mL/min (ref >60)
Glucose, Bld: 143 mg/dL — ABNORMAL HIGH (ref 70–99)
Potassium: 3.9 mmol/L (ref 3.5–5.1)
Sodium: 130 mmol/L — ABNORMAL LOW (ref 135–145)

## 2018-12-31 LAB — GLUCOSE, CAPILLARY
Glucose-Capillary: 139 mg/dL — ABNORMAL HIGH (ref 70–99)
Glucose-Capillary: 231 mg/dL — ABNORMAL HIGH (ref 70–99)
Glucose-Capillary: 149 mg/dL — ABNORMAL HIGH (ref 70–99)
Glucose-Capillary: 201 mg/dL — ABNORMAL HIGH (ref 70–99)

## 2018-12-31 MED ORDER — CITALOPRAM HYDROBROMIDE 10 MG PO TABS
10.0000 mg | ORAL_TABLET | Freq: Every day | ORAL | Status: DC
  Administered 2018-12-31 – 2019-01-14 (×15): 10 mg via ORAL
  Filled 2018-12-31 (×15): qty 1

## 2018-12-31 MED ORDER — SODIUM CHLORIDE 0.9% FLUSH
10.0000 mL | INTRAVENOUS | Status: DC | PRN
  Administered 2019-01-07 – 2019-01-09 (×2): 10 mL
  Filled 2018-12-31 (×2): qty 40

## 2018-12-31 MED ORDER — SODIUM CHLORIDE 0.9% FLUSH
10.0000 mL | Freq: Two times a day (BID) | INTRAVENOUS | Status: DC
  Administered 2018-12-31 – 2019-01-12 (×15): 10 mL
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
## 2018-12-31 NOTE — Patient Care Conference (Signed)
Inpatient RehabilitationTeam Conference and Plan of Care Update Date: 12/31/2018   Time: 2:10 PM    Patient Name: [name]      Medical Record Number: [id number]  Date of Birth: 1951-01-14 Sex: Male         Room/Bed: 4M03C/4M03C-01 Payor Info: Payor: [place] / Plan: [employer] PPO / Product Type: *No Product type* /    Admitting Diagnosis: 5. Gen Team  Debility, sepsis; 13-14days  Admit Date/Time:  12/26/2018  3:39 PM Admission Comments: No comment available   Primary Diagnosis:  <principal problem not specified> Principal Problem: <principal problem not specified>  Patient Active Problem List   Diagnosis Date Noted  . Acute pain of left knee   . Moderate episode of recurrent major depressive disorder ([place])   . Labile blood pressure   . Labile blood glucose   . Hypoalbuminemia due to protein-calorie malnutrition ([place])   . Debility 12/26/2018  . Acute on chronic renal failure ([place]) 12/19/2018  . Severe sepsis ([place]) 12/19/2018  . Hyponatremia 12/19/2018  . History of DVT (deep vein thrombosis) 12/19/2018  . Non-healing surgical wound 09/11/2018  . CKD (chronic kidney disease) stage 3, GFR 30-59 ml/min (HCC) 06/28/2018  . PVD (peripheral vascular disease) ([place]) 06/28/2018  . Diabetic foot ulcer ([place]) 06/27/2018  . Anemia 06/27/2018  . Benign essential HTN   . Diabetes mellitus type 2 in obese ([place])   . Acute blood loss anemia   . Tachypnea   . Leukocytosis   . Critical limb ischemia with history of revascularization of same extremity 04/28/2018  . Critical lower limb ischemia 04/28/2018  . Claudication in peripheral vascular disease ([place]) 08/04/2017  . IDDM (insulin dependent diabetes mellitus) ([place]) 12/02/2012  . HTN (hypertension) 12/02/2012    Expected Discharge Date: Expected Discharge Date: 01/07/19  Team Members Present: Physician leading conference: Dr. Delice Lesch Social Worker Present: Ovidio Kin, LCSW Nurse Present: Rayetta Humphrey, RN PT  Present: Georjean Mode, PT OT Present: Willeen Cass, OT;Roanna Epley, COTA SLP Present: Weston Anna, SLP PPS Coordinator present : Gunnar Fusi, SLP     Current Status/Progress Goal Weekly Team Focus  Medical   Functional and mobility deficits secondary to PAD, sepsis, chronic non-healing wounds LLE requiring wound washout.  Improve mobility, transfers, DM, pain, hyponatremia  see above   Bowel/Bladder   Continent of bowel and bladder  remain continent  assess toileting needs qshift and PRN   Swallow/Nutrition/ Hydration             ADL's   bathing at bed level/EOB-mod A; LB dressing at bed level/EOB-max A; scoot BSC transfers-min A; toileting-min A with lateral leans; sit<>stand-max A  supervision overall  sit<>stand, functional transfers, LB bahting/dressing, education, activity tolerance   Mobility   min bed mobility and scoot transfers, S w/c propulsion; refusing to attempt standing due to pain  S bed and basic transfers, min assist car, I w/c x 150',  min assist gait x 25'  activity tolerance, therex, bed mobility, transfers, w/c propulsion   Communication             Safety/Cognition/ Behavioral Observations            Pain   pain to left leg controlled with Percocet q4 PRN and tramdol q6 PRN  pain less than 3  assess pain management qhshitf and PRN   Skin   3 Surigcal incision sites to left leg. wet to dry dressing and wraped with ace bandage  Qshift  dressing changes. Promte proper wound healing  assess skin qshift and PRN      *See Care Plan and progress notes for long and short-term goals.     Barriers to Discharge  Current Status/Progress Possible Resolutions Date Resolved   Physician    Medical stability;Wound Care;Weight     See above  Therapies, optimize DM management, optimize pain meds, follow labs      Nursing                  PT  Wound Care                 OT                  SLP                SW                Discharge Planning/Teaching  Needs:  HOme with wife who will take time off from work to be there with him and provide care. pt want sot be mod.i before going home      Team Discussion:  Goals supervision level goals-not standing due to pt does not feel ready to stand. MD ordered Prafo boot for night time. R-patella pain-MD aware now. BP controlled and encouraging fluids. Activity tolerance issues. Maybe try darco shoe.  Revisions to Treatment Plan:  DC 7/15    Continued Need for Acute Rehabilitation Level of Care: The patient requires daily medical management by a physician with specialized training in physical medicine and rehabilitation for the following conditions: Daily direction of a multidisciplinary physical rehabilitation program to ensure safe treatment while eliciting the highest outcome that is of practical value to the patient.: Yes Daily medical management of patient stability for increased activity during participation in an intensive rehabilitation regime.: Yes Daily analysis of laboratory values and/or radiology reports with any subsequent need for medication adjustment of medical intervention for : Wound care problems;Diabetes problems;Other   I attest that I was present, lead the team conference, and concur with the assessment and plan of the team. Teleconference held due to COVID 19   Joah Patlan, [name] 12/31/2018, 4:12 PM

## 2018-12-31 NOTE — Progress Notes (Addendum)
Physical Therapy Session Note  Patient Details  Name: [name] MRN: [id number] Date of Birth: 10/26/1950  Today's Date: 12/31/2018 PT Individual Time: 0800-0903, 1430- 0932 PT Individual Time Calculation (min): 63 min , 45 min  Short Term Goals: Week 1:  PT Short Term Goal 1 (Week 1): Pt will perform supine<>sit with CGA PT Short Term Goal 2 (Week 1): Pt will perform slide board transfers with supervision PT Short Term Goal 3 (Week 1): Pt will perform sit<>stand transfers with max assist of 1 PT Short Term Goal 4 (Week 1): Pt will initiate gait training  Skilled Therapeutic Interventions/Progress Updates:   tx 1:  Pt resting in bed.  Pt rated pain 8/10 R patella and wounds LL, aching.  Premedicated.  Pt described intermittent pain x 3 years PTA,  L patella, tender to palpation and painful with wt bearing.  Pt used NSAIDs with good results for this.  Supine Therapeutic exercise performed with LE to increase strength for functional mobility- 10 x 1 each: R straight leg raises;  2 x 10 :  modified abdominal crunches, bil scapular protraction, L ankle pumps (minimal excursion), R ankle pumps.   Rolling R and sitting up with min assist.  Lateral scoot -across transfer to R bed> w/c with min assist for LLE to place over R foot.    W/c propulsion over level tile x 100' with supervision.  Simulated car transfer with min assist for LLE.    At end of session, pt resting in w/c with needs at hand, LLE elevated.   tx 2:  Pt resting in wc.  Pain rated 8/10 patella and lower leg, premedicated. PRAFO now in room for LLE.   PT discussed with pt ELOS and how it will differ if he wants to pursue standing and gait training.  He agreed that CIR is the best setting to initiate that and agreed to try standing in //.  With 1" high foam board under R foot, and pillow case under L foot, sit><stand in // +2.  Pt tolerated standing x 4 seconds, x 10 seconds.  Weakness RLE limited him, rather than  pain in LLE.    W/c propulsion using bil UEs on  Level tile x 150' x 2, supervision.  neuromuscular re-education via  multimodal cues and PT assisting lift weight of LLE, quad activation for visible knee extension x 5.  Seated R heel raises, bil glut sets x 10 x 2 each.  At end of session, pt resting in w/c with needs at hand and LLE elevated on ELR.   Therapy Documentation Precautions:  Precautions Precautions: Fall Restrictions Weight Bearing Restrictions: No       Therapy/Group: Individual Therapy  Yaniah Thiemann 12/31/2018, 10:09 AM

## 2018-12-31 NOTE — Progress Notes (Signed)
Orthopedic Tech Progress Note Patient Details:  [name] 08/01/1950 [id number]  Patient ID: [name], male   DOB: [birth year], 68 y.o.   MRN: [id number]   [name] 12/31/2018, 9:08 AMCalled [facility] for left Prafo boot.

## 2018-12-31 NOTE — Progress Notes (Addendum)
[facility] PHYSICAL MEDICINE & REHABILITATION PROGRESS NOTE  Subjective/Complaints: Patient seen laying in bed this morning.  He states he slept well overnight.  He notes left knee tenderness.  Discussed positioning and icing with patient.  He notes improvement in wound healing.  ROS: + Left knee tenderness.  Denies CP, shortness of breath, nausea, vomiting, diarrhea.  Objective: Vital Signs: Blood pressure 121/86, pulse 70, temperature 99.6 F (37.6 C), temperature source Oral, resp. rate 17, weight 103.2 kg, SpO2 98 %. No results found. Recent Labs    12/30/18 0721 12/31/18 0706  WBC 8.3 8.3  HGB 8.4* 8.3*  HCT 25.7* 25.7*  PLT 265 291   Recent Labs    12/30/18 0721 12/31/18 0706  NA 129* 130*  K 4.1 3.9  CL 97* 97*  CO2 22 24  GLUCOSE 242* 143*  BUN 17 17  CREATININE 1.01 0.92  CALCIUM 8.3* 8.6*    Physical Exam: BP 121/86 (BP Location: Right Arm)   Pulse 70   Temp 99.6 F (37.6 C) (Oral)   Resp 17   Wt 103.2 kg   SpO2 98%   BMI 30.02 kg/m  Constitutional: No distress . Vital signs reviewed. HENT: Normocephalic.  Atraumatic. Eyes: EOMI.  No discharge. Cardiovascular: No JVD. Respiratory: Normal effort. GI: Non-distended. Musc: Left foot with digit mutations 2-5 Left lower extremity with edema and tenderness, stable Neurological: He is alert and oriented Motor: Bilateral upper extremity: 5/5 proximal and distal Right lower extremity: 4+/5 proximal distal, stable Left lower extremity: Hip flexion 3/5, knee extension 2+/5, ankle dorsiflexion 3+/5 (some pain inhibition), stable Skin: Left lower extremity with dressing C/D/I (please see media tab) psychiatric: Normal mood.  Normal behavior.  Assessment/Plan: 1. Functional deficits secondary to debility which require 3+ hours per day of interdisciplinary therapy in a comprehensive inpatient rehab setting.  Physiatrist is providing close team supervision and 24 hour management of active medical problems  listed below.  Physiatrist and rehab team continue to assess barriers to discharge/monitor patient progress toward functional and medical goals  Care Tool:  Bathing    Body parts bathed by patient: Right arm, Left arm, Chest, Abdomen, Front perineal area, Right upper leg, Left upper leg, Face   Body parts bathed by helper: Buttocks, Right lower leg, Left lower leg     Bathing assist Assist Level: Moderate Assistance - Patient 50 - 74%     Upper Body Dressing/Undressing Upper body dressing   What is the patient wearing?: Pull over shirt    Upper body assist Assist Level: Supervision/Verbal cueing    Lower Body Dressing/Undressing Lower body dressing      What is the patient wearing?: Pants     Lower body assist Assist for lower body dressing: Maximal Assistance - Patient 25 - 49%     Toileting Toileting    Toileting assist Assist for toileting: Minimal Assistance - Patient > 75%     Transfers Chair/bed transfer  Transfers assist     Chair/bed transfer assist level: Contact Guard/Touching assist     Locomotion Ambulation   Ambulation assist   Ambulation activity did not occur: Safety/medical concerns          Walk 10 feet activity   Assist  Walk 10 feet activity did not occur: Safety/medical concerns        Walk 50 feet activity   Assist Walk 50 feet with 2 turns activity did not occur: Safety/medical concerns         Walk 150 feet  activity   Assist Walk 150 feet activity did not occur: Safety/medical concerns         Walk 10 feet on uneven surface  activity   Assist Walk 10 feet on uneven surfaces activity did not occur: Safety/medical concerns         Wheelchair     Assist Will patient use wheelchair at discharge?: Yes Type of Wheelchair: Manual    Wheelchair assist level: Supervision/Verbal cueing, Set up assist Max wheelchair distance: 124ft    Wheelchair 50 feet with 2 turns activity    Assist         Assist Level: Set up assist, Supervision/Verbal cueing   Wheelchair 150 feet activity     Assist     Assist Level: Supervision/Verbal cueing, Set up assist      Medical Problem List and Plan: 1.Functional and mobility deficitssecondary to PAD, sepsis, chronic non-healing wounds LLE requiring wound washout. Pt significantly debilitated  Continue CIR  Team conference today to discuss current and goals and coordination of care, home and environmental barriers, and discharge planning with nursing, case manager, and therapies.  2. Antithrombotics: -h/o DVT/anticoagulation:Pharmaceutical:Heparin--Xarelto on hold -antiplatelet therapy: Plavix on hold. 3. Pain Management:Continue Lyrica at bedtime  Ice for left knee pain, PRAFO was ordered to help with positioning-kickstand needs to be out 4. Mood:LCSW to follow for evaluation and support -antipsychotic agents: N/A 5. Neuropsych:this patient iscapable of making decisions onhisown behalf.  Continue to provide positive encouragement 6. Skin/Wound Care:Wet-to-dry dressings dailyto LLE. 7. Fluids/Electrolytes/Nutrition:Monitor I's and O's.  Nutritional supplements for wound healing. 8.Strep B wound culture: Continue IV Ancef 9.T2DM: Monitor blood sugars AC at bedtime--continue to titrate Lantus upwards for tighter blood sugar control  NovoLog increased to 9 TID on 7/6  Elevated on 7/8  Monitor with increased mobility 10 Iron deficiency: Continue to monitor with serial checks.             -iron supplement            Hemoglobin 8.3 on 7/8  Continue to monitor 11.HTN: Hypotension has resolved.  No medications at present  Controlled on 7/8  Monitor with increased mobility 12.Constipation: ON miralax.  13.  Hyponatremia  Sodium 130 on 7/8  Continue to monitor 14.  Hypoalbuminemia  Supplement initiated on 7/4   LOS: 5 days A FACE TO FACE EVALUATION WAS PERFORMED  Ankit Lorie Phenix 12/31/2018, 8:54 AM

## 2018-12-31 NOTE — Progress Notes (Signed)
Occupational Therapy Session Note  Patient Details  Name: [name] MRN: [id number] Date of Birth: 25-Sep-1950  Today's Date: 12/31/2018 OT Individual Time: 1305-1400 OT Individual Time Calculation (min): 55 min    Short Term Goals: Week 1:  OT Short Term Goal 1 (Week 1): Pt will sit to stand with consistent mod A in prep for clothing managment OT Short Term Goal 2 (Week 1): Pt will complete toilet transfer wiht S and LRAD via lateral scoot to decrease burden of care OT Short Term Goal 3 (Week 1): Pt will thread BLE into pants wiht AE PRN and min A OT Short Term Goal 4 (Week 1): Pt will don 1/2 socks wiht AE PRN and min VC for AE instruction  Skilled Therapeutic Interventions/Progress Updates:    1:1 Pt in bed when arrived. Pt came to EOB with more than reasonable amt of time with A for left LE movement. Pt reports soreness around top of knee and with knee extension  And can only tolerate ~20 degrees of flexion. Pt performs min A transfer into w/c with even transfer with more than reasonable time and a to position left LE. Took ~30 min to get into the w/c. Transition to the gym and performed 6 min on Sci Fit with minimal resistance before requesting to go to the bathroom.  Min a transfer onto wide drop arm BSc with A for setup. Lateral leans for clothing management. Pt able to perform hygiene and min A to pull them back up. Left sitting up in the w/c  Did discuss wide drop arm and hospital bed for home.   Therapy Documentation Precautions:  Precautions Precautions: Fall Restrictions Weight Bearing Restrictions: No Pain: Pain at top of knee and difficulty with any PROM- allowed for rest.   Therapy/Group: Individual Therapy  [facility] 12/31/2018, 2:05 PM

## 2018-12-31 NOTE — Progress Notes (Signed)
Occupational Therapy Session Note  Patient Details  Name: MERCURY ROCK MRN: [id number] Date of Birth: 05/03/1951  Today's Date: 12/31/2018 OT Individual Time: 1100-1155 OT Individual Time Calculation (min): 55 min    Short Term Goals: Week 1:  OT Short Term Goal 1 (Week 1): Pt will sit to stand with consistent mod A in prep for clothing managment OT Short Term Goal 2 (Week 1): Pt will complete toilet transfer wiht S and LRAD via lateral scoot to decrease burden of care OT Short Term Goal 3 (Week 1): Pt will thread BLE into pants wiht AE PRN and min A OT Short Term Goal 4 (Week 1): Pt will don 1/2 socks wiht AE PRN and min VC for AE instruction  Skilled Therapeutic Interventions/Progress Updates:    Pt resting in bed upon arrival with covers up to shoulders.  Pt stated he started trembling after drinking cold drink. Pt stated he didn't want to get OOB but willing to engaged in [place] therex.  Discussed home arrangement and discharge plans.  Pt has no desire to stand and walk until LLE heals.  Pt engaged in BUE therex with 2kg ball (chest presses, overhead raises, overhead presses) 3X10. Pt remained seated in w/c with all needs within reach and bed alarm activated.   Therapy Documentation Precautions:  Precautions Precautions: Fall Restrictions Weight Bearing Restrictions: No   Pain: Pain Assessment Pain Scale: 0-10 Pain Score: 8  Pain Location: Leg Pain Orientation: Left Pain Descriptors / Indicators: Sore Pain Intervention(s): repositioned, emotional surface   Therapy/Group: Individual Therapy  Leroy Libman 12/31/2018, 3:01 PM

## 2019-01-01 ENCOUNTER — Inpatient Hospital Stay (HOSPITAL_COMMUNITY): Payer: BC Managed Care – PPO

## 2019-01-01 DIAGNOSIS — R52 Pain, unspecified: Secondary | ICD-10-CM

## 2019-01-01 DIAGNOSIS — M25469 Effusion, unspecified knee: Secondary | ICD-10-CM

## 2019-01-01 DIAGNOSIS — M1711 Unilateral primary osteoarthritis, right knee: Secondary | ICD-10-CM

## 2019-01-01 LAB — GLUCOSE, CAPILLARY
Glucose-Capillary: 109 mg/dL — ABNORMAL HIGH (ref 70–99)
Glucose-Capillary: 119 mg/dL — ABNORMAL HIGH (ref 70–99)
Glucose-Capillary: 156 mg/dL — ABNORMAL HIGH (ref 70–99)
Glucose-Capillary: 206 mg/dL — ABNORMAL HIGH (ref 70–99)
Glucose-Capillary: 238 mg/dL — ABNORMAL HIGH (ref 70–99)
Glucose-Capillary: 62 mg/dL — ABNORMAL LOW (ref 70–99)

## 2019-01-01 MED ORDER — HEPARIN SODIUM (PORCINE) 5000 UNIT/ML IJ SOLN
5000.0000 [IU] | Freq: Three times a day (TID) | INTRAMUSCULAR | Status: AC
  Administered 2019-01-01 (×2): 5000 [IU] via SUBCUTANEOUS
  Filled 2019-01-01 (×2): qty 1

## 2019-01-01 MED ORDER — DICLOFENAC SODIUM 1 % TD GEL
2.0000 g | Freq: Four times a day (QID) | TRANSDERMAL | Status: DC
  Administered 2019-01-01 – 2019-01-14 (×34): 2 g via TOPICAL
  Filled 2019-01-01 (×2): qty 100

## 2019-01-01 MED ORDER — INSULIN ASPART 100 UNIT/ML ~~LOC~~ SOLN
13.0000 [IU] | Freq: Three times a day (TID) | SUBCUTANEOUS | Status: DC
  Administered 2019-01-01 – 2019-01-02 (×2): 13 [IU] via SUBCUTANEOUS

## 2019-01-01 MED ORDER — HEPARIN SODIUM (PORCINE) 5000 UNIT/ML IJ SOLN
5000.0000 [IU] | Freq: Three times a day (TID) | INTRAMUSCULAR | Status: DC
  Administered 2019-01-02 – 2019-01-06 (×12): 5000 [IU] via SUBCUTANEOUS
  Filled 2019-01-01 (×12): qty 1

## 2019-01-01 NOTE — Progress Notes (Signed)
[place] PHYSICAL MEDICINE & REHABILITATION PROGRESS NOTE  Subjective/Complaints: Patient seen laying in bed this AM.  He states he slept fairly overnight.  He has questions regarding xray  ROS: + Left knee tenderness, persistent.  Denies CP, shortness of breath, nausea, vomiting, diarrhea.  Objective: Vital Signs: Blood pressure 118/78, pulse 66, temperature 98.4 F (36.9 C), temperature source Oral, resp. rate 16, weight 103 kg, SpO2 97 %. Dg Knee Complete 4 Views Left  Result Date: 12/31/2018 CLINICAL DATA:  LEFT knee pain at patella for 2 years EXAM: LEFT KNEE - COMPLETE 4+ VIEW COMPARISON:  12/19/2018 FINDINGS: Osseous demineralization. Diffuse joint space narrowing. No acute fracture, dislocation, or bone destruction. Knee joint effusion present. Extensive vascular stents. Diffuse soft tissue swelling. IMPRESSION: Degenerative changes and osseous demineralization LEFT knee with associated joint effusion. No acute abnormalities. Electronically Signed   By: Lavonia Dana M.D.   On: 12/31/2018 17:22   Recent Labs    12/30/18 0721 12/31/18 0706  WBC 8.3 8.3  HGB 8.4* 8.3*  HCT 25.7* 25.7*  PLT 265 291   Recent Labs    12/30/18 0721 12/31/18 0706  NA 129* 130*  K 4.1 3.9  CL 97* 97*  CO2 22 24  GLUCOSE 242* 143*  BUN 17 17  CREATININE 1.01 0.92  CALCIUM 8.3* 8.6*    Physical Exam: BP 118/78 (BP Location: Right Arm)   Pulse 66   Temp 98.4 F (36.9 C) (Oral)   Resp 16   Wt 103 kg   SpO2 97%   BMI 29.96 kg/m  Constitutional: No distress . Vital signs reviewed. HENT: Normocephalic.  Atraumatic. Eyes: EOMI.  No discharge. Cardiovascular: No JVD. Respiratory: Normal effort. GI: Non--distended. Musc: Left foot with digit mutations 2-5 Left lower extremity with edema and tenderness> knee Neurological: He is alert and oriented Motor: Bilateral upper extremity: 5/5 proximal and distal Right lower extremity: 4+/5 proximal distal, unchanged Left lower extremity: Hip  flexion 3/5, knee extension 2+/5, ankle dorsiflexion 3+/5 (some pain inhibition), unchanged Skin: Left lower extremity with dressing C/D/I (please see media tab).  No erythema around it. psychiatric: Normal mood.  Normal behavior.  Assessment/Plan: 1. Functional deficits secondary to debility which require 3+ hours per day of interdisciplinary therapy in a comprehensive inpatient rehab setting.  Physiatrist is providing close team supervision and 24 hour management of active medical problems listed below.  Physiatrist and rehab team continue to assess barriers to discharge/monitor patient progress toward functional and medical goals  Care Tool:  Bathing    Body parts bathed by patient: Right arm, Left arm, Chest, Abdomen, Front perineal area, Right upper leg, Left upper leg, Face   Body parts bathed by helper: Buttocks, Right lower leg, Left lower leg     Bathing assist Assist Level: Moderate Assistance - Patient 50 - 74%     Upper Body Dressing/Undressing Upper body dressing   What is the patient wearing?: Pull over shirt    Upper body assist Assist Level: Supervision/Verbal cueing    Lower Body Dressing/Undressing Lower body dressing      What is the patient wearing?: Pants     Lower body assist Assist for lower body dressing: Maximal Assistance - Patient 25 - 49%     Toileting Toileting    Toileting assist Assist for toileting: Minimal Assistance - Patient > 75%     Transfers Chair/bed transfer  Transfers assist     Chair/bed transfer assist level: Minimal Assistance - Patient > 75%  Locomotion Ambulation   Ambulation assist   Ambulation activity did not occur: Safety/medical concerns          Walk 10 feet activity   Assist  Walk 10 feet activity did not occur: Safety/medical concerns        Walk 50 feet activity   Assist Walk 50 feet with 2 turns activity did not occur: Safety/medical concerns         Walk 150 feet  activity   Assist Walk 150 feet activity did not occur: Safety/medical concerns         Walk 10 feet on uneven surface  activity   Assist Walk 10 feet on uneven surfaces activity did not occur: Safety/medical concerns         Wheelchair     Assist Will patient use wheelchair at discharge?: Yes Type of Wheelchair: Manual    Wheelchair assist level: Supervision/Verbal cueing Max wheelchair distance: 150    Wheelchair 50 feet with 2 turns activity    Assist        Assist Level: Supervision/Verbal cueing   Wheelchair 150 feet activity     Assist     Assist Level: Supervision/Verbal cueing      Medical Problem List and Plan: 1.Functional and mobility deficitssecondary to PAD, sepsis, chronic non-healing wounds LLE requiring wound washout. Pt significantly debilitated  Continue CIR 2. Antithrombotics: -h/o DVT/anticoagulation:Pharmaceutical:Heparin--Xarelto on hold -antiplatelet therapy: Plavix on hold. 3. Pain Management:Continue Lyrica at bedtime 4. Mood:LCSW to follow for evaluation and support -antipsychotic agents: N/A 5. Neuropsych:this patient iscapable of making decisions onhisown behalf.  Continue to provide positive encouragement 6. Skin/Wound Care:Wet-to-dry dressings dailyto LLE. 7. Fluids/Electrolytes/Nutrition:Monitor I's and O's.  Nutritional supplements for wound healing. 8.Strep B wound culture: Continue IV Ancef 9.T2DM: Monitor blood sugars AC at bedtime--continue to titrate Lantus upwards for tighter blood sugar control  NovoLog increased to 9 TID on 7/6, increased to 13 3 times daily on 7/9  Elevated and labile on 7/9  Monitor with increased mobility 10 Iron deficiency: Continue to monitor with serial checks.             -iron supplement            Hemoglobin 8.3 on 7/8  Continue to monitor 11.HTN: Hypotension has resolved.  No medications at present  Controlled on  7/9  Monitor with increased mobility 12.Constipation: ON miralax.  13.  Hyponatremia  Sodium 130 on 7/8  Continue to monitor 14.  Hypoalbuminemia  Supplement initiated on 7/4 15.  Right knee OA  Ice for left knee pain, PRAFO was ordered to help with positioning-kickstand needs to be out  Knee x-ray personally reviewed and reviewed with patient.  Attempted to review films from last month, however technical limitations.  Discussed with PA, discussed with radiology-no significant changes and osteopenia  Will consider aspiration of effusion  Voltaren gel started on 7/9  WBCs reviewed again, within normal limits and afebrile   LOS: 6 days A FACE TO FACE EVALUATION WAS PERFORMED  Judas Mohammad Lorie Phenix 01/01/2019, 12:32 PM

## 2019-01-01 NOTE — Progress Notes (Signed)
Occupational Therapy Session Note  Patient Details  Name: [name] MRN: [id number] Date of Birth: 22-Jun-1951  Today's Date: 01/01/2019 OT Individual Time: 0915-1010 OT Individual Time Calculation (min): 55 min    Short Term Goals: Week 1:  OT Short Term Goal 1 (Week 1): Pt will sit to stand with consistent mod A in prep for clothing managment OT Short Term Goal 2 (Week 1): Pt will complete toilet transfer wiht S and LRAD via lateral scoot to decrease burden of care OT Short Term Goal 3 (Week 1): Pt will thread BLE into pants wiht AE PRN and min A OT Short Term Goal 4 (Week 1): Pt will don 1/2 socks wiht AE PRN and min VC for AE instruction  Skilled Therapeutic Interventions/Progress Updates:    OT intervention with focus on bed mobility and BUE therex with theraband to increase UB strength and independence with BADLs. Pt declined getting OOB this afternoon.  Pt surprised that his discharge was set for 7/15.  Therapist explained that without ambulation goals/standing goals, there was no reason to remain in rehab.  Pt verbalized understanding.  See below for therex. Pt remained in bed with all needs within reach and bed alarm activated.  Therapy Documentation Precautions:  Precautions Precautions: Fall Restrictions Weight Bearing Restrictions: No    Pain:  Pt c/o LLE/knee discomfort (unrated); repositioned, emotional support   Exercises: General Exercises - Upper Extremity Shoulder Flexion: AROM;Strengthening;Both;15 reps;Supine;Theraband Theraband Level (Shoulder Flexion): Level 1 (Yellow) Shoulder Extension: AROM;Strengthening;Both;15 reps;Supine;Theraband Theraband Level (Shoulder Extension): Level 1 (Yellow) Shoulder ADduction: AROM;Strengthening;Both;15 reps;Supine;Theraband Theraband Level (Shoulder Adduction): Level 1 (Yellow) Elbow Flexion: AROM;Both;15 reps;Supine;Theraband Theraband Level (Elbow Flexion): Level 1 (Yellow) Elbow Extension: AROM;Strengthening;Both;15  reps;Supine;Theraband Theraband Level (Elbow Extension): Level 1 (Yellow)     Therapy/Group: Individual Therapy  Leroy Libman 01/01/2019, 10:18 AM

## 2019-01-01 NOTE — Progress Notes (Signed)
Physical Therapy Session Note  Patient Details  Name: [name] MRN: [id number] Date of Birth: 05-30-1951  Today's Date: 01/01/2019 PT Individual Time: 1300-1415 PT Individual Time Calculation (min): 75 min   Short Term Goals: Week 1:  PT Short Term Goal 1 (Week 1): Pt will perform supine<>sit with CGA PT Short Term Goal 2 (Week 1): Pt will perform slide board transfers with supervision PT Short Term Goal 3 (Week 1): Pt will perform sit<>stand transfers with max assist of 1 PT Short Term Goal 4 (Week 1): Pt will initiate gait training      Skilled Therapeutic Interventions/Progress Updates:   Pt resting in bed.  He expressed frustration that his d/c has been set shorter than originally discussed, due to his reluctance to initiate standing/pre-gait training.  PT discussed differences between SNF and IP Rehab, as well as CIR LTGs and how they influence ELOS.  Pt also discussed current  POC with him, and assured him that if standing/gait is something he wants to pursue, LTGs are already set, and LOS can be extended.  Pt now has L PRAFO, which he states helps somewhat with L knee pain.  In supine, 10 x 1 bil hip internal rotation.    W/c propulsiion x 150' with superivsion.  Pt manipulated RELR and L footrest with min assist.  PT lengthened ELR to facilitate L knee extension.  Sit> stand from wc to //, pulling up bil hands, mod/max assist, focusing on transferring wt forward and to R.  Pt stood x 1 minute, including wt shifting R><L toe touch, and L hip flexion/extension with foot on pillow case.  Sit> stand with mod assist.  Gait in // with min assist, 5',  TT LLE. Cues for longer R step length.  Mod/max assist stand> sit for eccentric control and LLE mgt.  In standing, pt rests with L hip and knee flexed, in ankle PF.  He is able to place his forefoot on floor, but PF contracture prevents foot flat.  neuromuscular re-education via multimodal cues and visual feedback, seated, R short arc  quads with assistance,  R ankle pumps with 5 second holds, for motor control and flexibiltiy. pt has visible quad contraction when attempting knee extension, but limited movement.   Pt is ready to start QD standing funcitonally, and gait training.  PT spoke with [place], CSW and [place], [place] about extending LOS 1 week.  Pt is agreeable.  At end of session, pt sitting in w/c , putting clothes away in bedside table.  W/c not accessible to bureau.        Therapy Documentation Precautions:  Precautions Precautions: Fall Restrictions Weight Bearing Restrictions: No  Pain: 6/10, L patella and L LL;premedicated ; LLE Voltaren has been started for patella.  Pt stated he is very helpful.  .          Therapy/Group: Individual Therapy  Dalilah Curlin 01/01/2019, 5:08 PM

## 2019-01-01 NOTE — Progress Notes (Signed)
Physical Therapy Session Note  Patient Details  Name: [name] MRN: [id number] Date of Birth: 07-Aug-1950  Today's Date: 01/01/2019 PT Individual Time: 1545-1630 PT Individual Time Calculation (min): 45 min   Short Term Goals: Week 1:  PT Short Term Goal 1 (Week 1): Pt will perform supine<>sit with CGA PT Short Term Goal 2 (Week 1): Pt will perform slide board transfers with supervision PT Short Term Goal 3 (Week 1): Pt will perform sit<>stand transfers with max assist of 1 PT Short Term Goal 4 (Week 1): Pt will initiate gait training  Skilled Therapeutic Interventions/Progress Updates:     Patient in bed upon PT arrival. Patient alert and agreeable to PT session. Reported 8/10 L patella pain, RN provided pain meds during session, PT provided repositioning and distraction thorughout session for pain interventions.   Therapeutic Activity: Bed Mobility: Patient performed supine to sit with supervision with bed flat using bed rails and sit to supine with min A for lifting L LE into the bed, still having difficulty scooting R LE with L at this time.  Transfers: Patient performed squat pivot transfers w/c<>bed with CGA for safety, able to tech back sequencing for removing leg rests, locking breaks, and removing arm rest, required cues for positioning w/c and technique for doffing leg rests. He performed sit to/from stand x2 with min A in the // bars. Provided verbal cues for sliding his L foot forward on a pillow case for reduced friction when sitting.  Gait Training:  Patient ambulated 8 feet forward and backward x2 in the // bars with CGA-min A for LOB posteriorly when ambulating backwards. Ambulated with hop-to gait pattern on R with intermittent touchdown weight bearing on L. Provided verbal cues for small hops with increased control for improved balance. Educated on progressing to increased weight bearing on L at tolerated with gait training.   Wheelchair Mobility:  Patient propelled  wheelchair 150 feet x2 with supervision and set-up assist for donning/doffing leg rests with hand over hand and verbal cues.   Patient in bed at end of session with breaks locked, bed alarm set, and all needs within reach. Educated patient on POC and progress with therapies, he seemed pleased with his progress and goals for ambulation at this time.    Therapy Documentation Precautions:  Precautions Precautions: Fall Restrictions Weight Bearing Restrictions: No    Therapy/Group: Individual Therapy  [name] PT, DPT  01/01/2019, 4:57 PM

## 2019-01-01 NOTE — Progress Notes (Signed)
Social Work Patient ID: [name], male   DOB: 11-09-1950, 68 y.o.   MRN: [id number] Met with pt and spoken with wife via telephone to discuss team conference goals and target discharge date. Wife is not happy regarding length of stay she was told 18-21 days and now it is 12 days. She wants her husband to be pushed and not allow him to tel the team he will stand when he is ready. She want the clinical director over the therapies to call her. Have asked her to come in and attend therapies with him. She wants Ben to call her first. Pt di not mention a concern regarding goals and discharge date.

## 2019-01-01 NOTE — Progress Notes (Signed)
Social Work Patient ID: [name], male   DOB: 10-07-1950, 68 y.o.   MRN: [id number] Have scheduled wife to be here Monday for education with pt @ 11:00 am. She wants to see how pt is doing in therapies and to push him to do more in PT. Will see on Monday.

## 2019-01-02 ENCOUNTER — Inpatient Hospital Stay (HOSPITAL_COMMUNITY): Payer: BC Managed Care – PPO

## 2019-01-02 ENCOUNTER — Other Ambulatory Visit: Payer: Self-pay

## 2019-01-02 DIAGNOSIS — I739 Peripheral vascular disease, unspecified: Secondary | ICD-10-CM

## 2019-01-02 LAB — GLUCOSE, CAPILLARY
Glucose-Capillary: 148 mg/dL — ABNORMAL HIGH (ref 70–99)
Glucose-Capillary: 174 mg/dL — ABNORMAL HIGH (ref 70–99)
Glucose-Capillary: 157 mg/dL — ABNORMAL HIGH (ref 70–99)
Glucose-Capillary: 180 mg/dL — ABNORMAL HIGH (ref 70–99)

## 2019-01-02 MED ORDER — INSULIN ASPART 100 UNIT/ML ~~LOC~~ SOLN
11.0000 [IU] | Freq: Three times a day (TID) | SUBCUTANEOUS | Status: DC
  Administered 2019-01-02 – 2019-01-12 (×29): 11 [IU] via SUBCUTANEOUS

## 2019-01-02 MED ORDER — LIDOCAINE 4 % EX CREA
TOPICAL_CREAM | Freq: Once | CUTANEOUS | Status: AC
  Administered 2019-01-02: 1 via TOPICAL
  Filled 2019-01-02: qty 5

## 2019-01-02 NOTE — Progress Notes (Addendum)
Physical Therapy Weekly Progress Note  Patient Details  Name: [name] MRN: [id number] Date of Birth: 04/23/1951  Beginning of progress report period: December 27, 2018 End of progress report period: January 02, 2019  Today's Date: 01/02/2019 PT Individual Time: 1030-1130 and 1300-1400 PT Individual Time Calculation (min): 60 min and 60 min  Patient has met 4 of 4 short term goals.  Currently requires supervision for supine to sit, min A for sit to supine, supervision for side scoot transfers, min-mod A for sit<>stand in parallel bars, and CGA-min A for ambulation 8' in the parallel bars. Initial progress towards goals was slow, limited by L LE and knee pain. Pain improved later in the week leading increased progress with therapies.   Patient continues to demonstrate the following deficits muscle weakness and muscle joint tightness, decreased cardiorespiratoy endurance and decreased sitting balance, decreased standing balance, decreased postural control and decreased balance strategies and therefore will continue to benefit from skilled PT intervention to increase functional independence with mobility.  Patient progressing toward long term goals..  Continue plan of care. May upgrade goals if patient continues to progress with therapies.  PT Short Term Goals Week 1:  PT Short Term Goal 1 (Week 1): Pt will perform supine<>sit with CGA PT Short Term Goal 1 - Progress (Week 1): Met PT Short Term Goal 2 (Week 1): Pt will perform slide board transfers with supervision PT Short Term Goal 2 - Progress (Week 1): Met PT Short Term Goal 3 (Week 1): Pt will perform sit<>stand transfers with max assist of 1 PT Short Term Goal 3 - Progress (Week 1): Met PT Short Term Goal 4 (Week 1): Pt will initiate gait training PT Short Term Goal 4 - Progress (Week 1): Met Week 2:  PT Short Term Goal 1 (Week 2): Patient will perform all bed mobility with supervision. PT Short Term Goal 2 (Week 2): Patient will  demonstrate partial to full weight bearing on L LE in standing as tolerated. PT Short Term Goal 3 (Week 2): Patient will perform sit<>stand with RW with mod A. PT Short Term Goal 4 (Week 2): Patient will ambulate 20 feet with LRAD with min A.  Skilled Therapeutic Interventions/Progress Updates:     Session 1: Patient sitting EOB upon PT arrival. Patient alert and agreeable to PT session requesting to use the [facility] at beginning of session. Reported 9/10 L knee and LE pain at beginning of session due to MD attempting aspiration of L knee. PT provided repositioning and distraction throughout session for pain interventions.   Therapeutic Activity: Bed Mobility: Patient performed sit to supine with min A for L LE management, improved lifting L LE with R foot, but needed some assist to lift it to the bed.  Transfers: Patient performed side scoot to/from the [facility] with CGA for safety and balance. Provided verbal cues for lifting bottom up to clear gap between bed and BSC. Required supervision for LB dressing and set-up and supervision for peri-care. Patient performed digital stimulation to stimulate a BM on BSC, reports he has needed to do this since before his surgery, but has not addressed it with his PCP, RN made aware.   Therapeutic Exercise: Patient performed the following exercises with verbal and tactile cues for proper technique. -L heel slides with mod-min A AAROM 2x7 -L SLR with mod A AAROM 2x5 -B glut sets 2x15 with 5 sec holds -B quad sets 2x10  Patient in bed at end of session with breaks locked, bed  alarm set, and all needs within reach.   Session 2: Patient in bed upon PT arrival. Patient alert and agreeable to PT session. Reported 4-5/10 L knee pain at beginning of session. PT provided repositioning, distraction, and cold therapy as pain interventions throughout session.   Therapeutic Activity: Bed Mobility: Patient performed supine to/from sit with supervision. Provided verbal cues  for lifting his L LE with his R. Transfers: Patient performed side scoot transfer to the w/c with supervision for safety and sit to/from stand at the sink in his room x2 with mod-min A. Provided verbal cues for scooting to the edge of the chair prior to standing.  Wheelchair Mobility:  Patient propelled wheelchair in the room up to 10 feet with supervision. PT demonstrated donning/doffing leg rests and patient was able to replicate technique with cues from therapist.   Neuromuscular Re-ed: Patient performed standing balance with CGA for safety and balance at the sink in his room, focusing on extending his L knee and initiating weight bearing onto his L foot 2x4 min. Able to stand progressing from 2, then 1, then no UE support each trial.  Patient in w/c at end of session with breaks locked, seat belt alarm set, and all needs within reach.   Therapy Documentation Precautions:  Precautions Precautions: Fall Restrictions Weight Bearing Restrictions: No   Therapy/Group: Individual Therapy  Cornellius Kropp L Avier Jech PT, DPT  01/02/2019, 4:02 PM

## 2019-01-02 NOTE — Progress Notes (Signed)
Social Work Patient ID: [name], male   DOB: 30-Aug-1950, 68 y.o.   MRN: [id number] Pt is not agreeable to work on ambulation goals now team wants to have stay another week to reach those goals. Wife spoke with pt and changed his mind regarding working on ambulation while here. MD agreeable to this.

## 2019-01-02 NOTE — Progress Notes (Signed)
[facility] PHYSICAL MEDICINE & REHABILITATION PROGRESS NOTE  Subjective/Complaints: Patient seen laying in bed this AM.  He slept well overnight.  Discussed plans for knee aspiration today.  ROS: + Left knee tenderness, unchanged.  Denies CP, shortness of breath, nausea, vomiting, diarrhea.  Objective: Vital Signs: Blood pressure 119/81, pulse 66, temperature 98.2 F (36.8 C), temperature source Oral, resp. rate 16, weight 103 kg, SpO2 98 %. Dg Knee Complete 4 Views Left  Result Date: 12/31/2018 CLINICAL DATA:  LEFT knee pain at patella for 2 years EXAM: LEFT KNEE - COMPLETE 4+ VIEW COMPARISON:  12/19/2018 FINDINGS: Osseous demineralization. Diffuse joint space narrowing. No acute fracture, dislocation, or bone destruction. Knee joint effusion present. Extensive vascular stents. Diffuse soft tissue swelling. IMPRESSION: Degenerative changes and osseous demineralization LEFT knee with associated joint effusion. No acute abnormalities. Electronically Signed   By: Lavonia Dana M.D.   On: 12/31/2018 17:22   Recent Labs    12/31/18 0706  WBC 8.3  HGB 8.3*  HCT 25.7*  PLT 291   Recent Labs    12/31/18 0706  NA 130*  K 3.9  CL 97*  CO2 24  GLUCOSE 143*  BUN 17  CREATININE 0.92  CALCIUM 8.6*    Physical Exam: BP 119/81 (BP Location: Left Arm)   Pulse 66   Temp 98.2 F (36.8 C) (Oral)   Resp 16   Wt 103 kg   SpO2 98%   BMI 29.96 kg/m  Constitutional: No distress . Vital signs reviewed. HENT: Normocephalic. Atraumatic.  Eyes: EOMI. No discharge. Cardiovascular: No JVD. Respiratory: Normal effort. GI: Non-distended. Musc: Left foot with digit mutations 2-5 Left lower extremity with edema and tenderness> knee Neurological: He is alert and oriented Motor: Bilateral upper extremity: 5/5 proximal and distal Right lower extremity: 4+/5 proximal distal, stable Left lower extremity: Hip flexion 3/5, knee extension 2+/5, ankle dorsiflexion 3+/5 (some pain inhibition),  stable Skin: Left lower extremity with dressing C/D/I (please see media tab).  No erythema around it. psychiatric: Normal mood.  Normal behavior.  Assessment/Plan: 1. Functional deficits secondary to debility which require 3+ hours per day of interdisciplinary therapy in a comprehensive inpatient rehab setting.  Physiatrist is providing close team supervision and 24 hour management of active medical problems listed below.  Physiatrist and rehab team continue to assess barriers to discharge/monitor patient progress toward functional and medical goals  Care Tool:  Bathing    Body parts bathed by patient: Right arm, Left arm, Chest, Abdomen, Front perineal area, Right upper leg, Left upper leg, Face   Body parts bathed by helper: Buttocks, Right lower leg, Left lower leg     Bathing assist Assist Level: Moderate Assistance - Patient 50 - 74%     Upper Body Dressing/Undressing Upper body dressing   What is the patient wearing?: Pull over shirt    Upper body assist Assist Level: Supervision/Verbal cueing    Lower Body Dressing/Undressing Lower body dressing      What is the patient wearing?: Pants     Lower body assist Assist for lower body dressing: Maximal Assistance - Patient 25 - 49%     Toileting Toileting    Toileting assist Assist for toileting: Minimal Assistance - Patient > 75%     Transfers Chair/bed transfer  Transfers assist     Chair/bed transfer assist level: Contact Guard/Touching assist     Locomotion Ambulation   Ambulation assist   Ambulation activity did not occur: Safety/medical concerns  Assist level: Minimal Assistance -  Patient > 75% Assistive device: Parallel bars Max distance: 8'   Walk 10 feet activity   Assist  Walk 10 feet activity did not occur: Safety/medical concerns        Walk 50 feet activity   Assist Walk 50 feet with 2 turns activity did not occur: Safety/medical concerns         Walk 150 feet  activity   Assist Walk 150 feet activity did not occur: Safety/medical concerns         Walk 10 feet on uneven surface  activity   Assist Walk 10 feet on uneven surfaces activity did not occur: Safety/medical concerns         Wheelchair     Assist Will patient use wheelchair at discharge?: Yes Type of Wheelchair: Manual    Wheelchair assist level: Set up assist, Supervision/Verbal cueing Max wheelchair distance: 150'    Wheelchair 50 feet with 2 turns activity    Assist        Assist Level: Set up assist, Supervision/Verbal cueing   Wheelchair 150 feet activity     Assist     Assist Level: Set up assist, Supervision/Verbal cueing      Medical Problem List and Plan: 1.Functional and mobility deficitssecondary to PAD, sepsis, chronic non-healing wounds LLE requiring wound washout. Pt significantly debilitated  Continue CIR 2. Antithrombotics: -h/o DVT/anticoagulation:Pharmaceutical:Heparin--Xarelto on hold -antiplatelet therapy: Plavix on hold. 3. Pain Management:Continue Lyrica at bedtime 4. Mood:LCSW to follow for evaluation and support -antipsychotic agents: N/A 5. Neuropsych:this patient iscapable of making decisions onhisown behalf.  Continue to provide positive encouragement 6. Skin/Wound Care:Wet-to-dry dressings dailyto LLE. 7. Fluids/Electrolytes/Nutrition:Monitor I's and O's.  Nutritional supplements for wound healing. 8.Strep B wound culture: Continue IV Ancef 9.T2DM: Monitor blood sugars AC at bedtime--continue to titrate Lantus upwards for tighter blood sugar control  NovoLog increased to 9 TID on 7/6, increased to 11 3 times daily on 7/10  Labile with hypoglycemia on 7/10  Monitor with increased mobility 10 Iron deficiency: Continue to monitor with serial checks.             -iron supplement            Hemoglobin 8.3 on 7/8, labs ordered for Monday  Continue to monitor 11.HTN:  Hypotension has resolved.  No medications at present  Controlled on 7/10  Monitor with increased mobility 12.Constipation: On miralax.  13.  Hyponatremia  Sodium 130 on 7/8, labs ordered for Monday  Continue to monitor 14.  Hypoalbuminemia  Supplement initiated on 7/4 15.  Right knee OA  Ice for left knee pain, PRAFO was ordered to help with positioning-kickstand needs to be out  Knee x-ray personally reviewed and reviewed with patient.  Attempted to review films from last month, however technical limitations.  Discussed with PA, discussed with radiology-no significant changes and osteopenia  Voltaren gel started on 7/9  WBCs reviewed again, within normal limits and afebrile today  Will aspirate today and send fluid for analysis  LOS: 7 days A FACE TO FACE EVALUATION WAS PERFORMED  Oreatha Fabry Lorie Phenix 01/02/2019, 9:09 AM

## 2019-01-02 NOTE — Progress Notes (Addendum)
Occupational Therapy Session Note  Patient Details  Name: [name] MRN: [id number] Date of Birth: 02/14/1951  Today's Date: 01/02/2019 OT Individual Time: 7741-4239 OT Individual Time Calculation (min): 69 min    Short Term Goals: Week 2:  OT Short Term Goal 1 (Week 2): Pt will sit to stand with consistent mod A in prep for clothing managment OT Short Term Goal 2 (Week 2): Pt will sit to stand with consistent mod A in prep for clothing managment OT Short Term Goal 3 (Week 2): Pt will complete LB dressing tasks with mod A OT Short Term Goal 4 (Week 2): Pt will completes toileting tasks with CGA  Skilled Therapeutic Interventions/Progress Updates:    Pt resting in bed upon arrival and agreeable to get OOB and completed bathing/dressing tasks. Pt required min A to move LLE to EOB but performed scoot transfer to w/c with close supervison.  Pt completed bathing/dressing and grooming tasks with sit<>stand from w/c at sink.  Pt performed sit<>stand at sink with CGA. Full weightbearing through RLE while washing buttocks and pulling up pants.  Pt requires more than a reasonable amount of time to complete tasks with multiple rest breaks, especially after sit<>stand. Pt requested return to bed at end of session. Pt encouraged by the day's progress.  MD to aspirate L knee today. Pt remained in bed with all needs within reach and bed alarm activated.   Therapy Documentation Precautions:  Precautions Precautions: Fall Restrictions Weight Bearing Restrictions: No  Pain: Pain Assessment Pain Scale: 0-10 Pain Score: 8; LLE Pain Intervention(s): emotional support and repositioned  Therapy/Group: Individual Therapy  Leroy Libman 01/02/2019, 9:30 AM

## 2019-01-02 NOTE — Progress Notes (Signed)
Occupational Therapy Weekly Progress Note  Patient Details  Name: [name] MRN: [id number] Date of Birth: 1951/01/04  Beginning of progress report period: December 27, 2018 End of progress report period: January 02, 2019  Patient has met 2 of 4 short term goals.  Pt progress initially slow because pt declined any standing or sit<>stand activities. Pt has since become motivated and is participating in [place] activites.  Pt performs scoot tranfsers with CGA/min A.  Pt actively participating in [place] activities and states he is encouraged by his progress.  ELOS extended by one week to continue to work on functional transfers and standing activities.  [name] is scheduled to observe therapy on 7/13.  Patient continues to demonstrate the following deficits: muscle weakness and acute pain, decreased cardiorespiratoy endurance and decreased standing balance and decreased balance strategies and therefore will continue to benefit from skilled OT intervention to enhance overall performance with BADL and Reduce care partner burden.  Patient progressing toward long term goals..  Continue plan of care.  OT Short Term Goals Week 1:  OT Short Term Goal 1 (Week 1): Pt will sit to stand with consistent mod A in prep for clothing managment OT Short Term Goal 1 - Progress (Week 1): Progressing toward goal OT Short Term Goal 2 (Week 1): Pt will complete toilet transfer wiht S and LRAD via lateral scoot to decrease burden of care OT Short Term Goal 2 - Progress (Week 1): Progressing toward goal OT Short Term Goal 3 (Week 1): Pt will thread BLE into pants wiht AE PRN and min A OT Short Term Goal 3 - Progress (Week 1): Met OT Short Term Goal 4 (Week 1): Pt will don 1/2 socks wiht AE PRN and min VC for AE instruction OT Short Term Goal 4 - Progress (Week 1): Met Week 2:  OT Short Term Goal 1 (Week 2): Pt will sit to stand with consistent mod A in prep for clothing managment OT Short Term Goal 2 (Week 2): Pt will sit to  stand with consistent mod A in prep for clothing managment OT Short Term Goal 3 (Week 2): Pt will complete LB dressing tasks with mod A OT Short Term Goal 4 (Week 2): Pt will completes toileting tasks with CGA   Leroy Libman 01/02/2019, 6:26 AM

## 2019-01-02 NOTE — Progress Notes (Signed)
Hypoglycemic Event  CBG: 62 @2146   Treatment: 1 Coke, 2 honey graham crackers w/ peanut butter  Symptoms: none noted at the time  Follow-up CBG: Time: @2316    CBG Result:119  Possible Reasons for Event: Lack of PO intake between afternoon and evening sugar checks  Comments/MD notified: Insulin aspart and glargine were not given due to hypoglycemic event, will continue to monitor    [name]

## 2019-01-03 ENCOUNTER — Inpatient Hospital Stay (HOSPITAL_COMMUNITY): Payer: BC Managed Care – PPO | Admitting: Physical Therapy

## 2019-01-03 DIAGNOSIS — I951 Orthostatic hypotension: Secondary | ICD-10-CM

## 2019-01-03 DIAGNOSIS — G8929 Other chronic pain: Secondary | ICD-10-CM

## 2019-01-03 LAB — GLUCOSE, CAPILLARY
Glucose-Capillary: 172 mg/dL — ABNORMAL HIGH (ref 70–99)
Glucose-Capillary: 174 mg/dL — ABNORMAL HIGH (ref 70–99)
Glucose-Capillary: 197 mg/dL — ABNORMAL HIGH (ref 70–99)
Glucose-Capillary: 149 mg/dL — ABNORMAL HIGH (ref 70–99)

## 2019-01-03 NOTE — Progress Notes (Signed)
Physical Therapy Session Note  Patient Details  Name: [name] MRN: [id number] Date of Birth: Aug 05, 1950  Today's Date: 01/03/2019 PT Individual Time: 0905-1005 PT Individual Time Calculation (min): 60 min   Short Term Goals: Week 2:  PT Short Term Goal 1 (Week 2): Patient will perform all bed mobility with supervision. PT Short Term Goal 2 (Week 2): Patient will demonstrate partial to full weight bearing on L LE in standing as tolerated. PT Short Term Goal 3 (Week 2): Patient will perform sit<>stand with RW with mod A. PT Short Term Goal 4 (Week 2): Patient will ambulate 20 feet with LRAD with min A.  Skilled Therapeutic Interventions/Progress Updates:   Pt received supine in bed and agreeable to PT. Supine>sit transfer with min assist for control of the LLE.   Lateral scoot without SB to WC with CGA from PT and min cues for UE placement and pain management of the LLE.   WC mobility x 167f and supervision assist with cues for door way management. Dynamic WC mobility to weave through 6 cones x 2 with min cues for awareness of cone location and decreased turning radius to improve safety.   Lateral scoot to mat table with supervision assist from PT and min cues for safety and improved anteir weight shift to prevent fall. Sit<>stand from elevated mat table x 3 with CGA-min assist. Min cues for UE push from mat table to improve safety. pregait LLE hip flexion/extension in standing 2 x 5. Pt instructed in gait training with RW [id number] and x [id number] assist from PT for safety with cues for increased WB through the LLE as tolerates.   LLE knee flexion/extension with heel slide on floor 2 x 8 with cues for full ROM as tolerated. Increasing knee flexion and extension noted with increased repetition.   Pt returned to room and performed squat pivot transfer to bed with supervision assist and cues for set up. Sit>supine completed with min assist at the LLE, and left supine in  bed with call bell in reach and all needs met.        Therapy Documentation Precautions:  Precautions Precautions: Fall Restrictions Weight Bearing Restrictions: No    Pain: Pain Assessment Pain Scale: 0-10 Pain Score: 8  Pain Type: Acute pain Pain Location: Knee Pain Orientation: Left Pain Descriptors / Indicators: Aching Pain Frequency: Intermittent Pain Onset: On-going Patients Stated Pain Goal: 4 Pain Intervention(s): Medication (See eMAR)    Therapy/Group: Individual Therapy  AustLorie Phenix1/2020, 10:07 AM

## 2019-01-03 NOTE — Progress Notes (Signed)
PHYSICAL MEDICINE & REHABILITATION PROGRESS NOTE  Subjective/Complaints: Patient seen laying in bed this morning.  He states he slept well overnight.  He states he feels better.  He notes improvement in pain.  ROS: Denies CP, shortness of breath, nausea, vomiting, diarrhea.  Objective: Vital Signs: Blood pressure 135/83, pulse 69, temperature 98.9 F (37.2 C), temperature source Oral, resp. rate 16, weight 100 kg, SpO2 100 %. No results found. No results for input(s): WBC, HGB, HCT, PLT in the last 72 hours. No results for input(s): NA, K, CL, CO2, GLUCOSE, BUN, CREATININE, CALCIUM in the last 72 hours.  Physical Exam: BP 135/83 (BP Location: Left Arm)   Pulse 69   Temp 98.9 F (37.2 C) (Oral)   Resp 16   Wt 100 kg   SpO2 100%   BMI 29.09 kg/m  Constitutional: No distress . Vital signs reviewed. HENT: Normocephalic.  Atraumatic. Eyes: EOMI.  No discharge. Cardiovascular: No JVD. Respiratory: Normal effort. GI: Non-distended. Musc: Left foot with digit mutations 2-5 Left lower extremity with edema and tenderness> [place] improving Neurological: He is alert and oriented Motor: Bilateral upper extremity: 5/5 proximal and distal Right lower extremity: 4+/5 proximal distal, unchanged Left lower extremity: Hip flexion 3+/5, knee extension 3+/5, ankle dorsiflexion 3+/5 (some pain inhibition) Skin: Left lower extremity with dressing C/D/I (please see media tab).  No erythema around it. psychiatric: Normal mood.  Normal behavior.  Assessment/Plan: 1. Functional deficits secondary to debility which require 3+ hours per day of interdisciplinary therapy in a comprehensive inpatient rehab setting.  Physiatrist is providing close team supervision and 24 hour management of active medical problems listed below.  Physiatrist and rehab team continue to assess barriers to discharge/monitor patient progress toward functional and medical goals  Care Tool:  Bathing    Body parts  bathed by patient: Right arm, Left arm, Chest, Abdomen, Front perineal area, Buttocks, Right upper leg, Left upper leg, Right lower leg, Face   Body parts bathed by helper: Buttocks, Right lower leg, Left lower leg Body parts n/a: Left lower leg   Bathing assist Assist Level: Minimal Assistance - Patient > 75%     Upper Body Dressing/Undressing Upper body dressing   What is the patient wearing?: Pull over shirt    Upper body assist Assist Level: Set up assist    Lower Body Dressing/Undressing Lower body dressing      What is the patient wearing?: Pants     Lower body assist Assist for lower body dressing: Minimal Assistance - Patient > 75%     Toileting Toileting    Toileting assist Assist for toileting: Minimal Assistance - Patient > 75%     Transfers Chair/bed transfer  Transfers assist     Chair/bed transfer assist level: Contact Guard/Touching assist     Locomotion Ambulation   Ambulation assist   Ambulation activity did not occur: Safety/medical concerns  Assist level: Minimal Assistance - Patient > 75% Assistive device: Parallel bars Max distance: 8'   Walk 10 feet activity   Assist  Walk 10 feet activity did not occur: Safety/medical concerns        Walk 50 feet activity   Assist Walk 50 feet with 2 turns activity did not occur: Safety/medical concerns         Walk 150 feet activity   Assist Walk 150 feet activity did not occur: Safety/medical concerns         Walk 10 feet on uneven surface  activity   Assist  Walk 10 feet on uneven surfaces activity did not occur: Safety/medical concerns         Wheelchair     Assist Will patient use wheelchair at discharge?: Yes Type of Wheelchair: Manual    Wheelchair assist level: Supervision/Verbal cueing, Set up assist Max wheelchair distance: 10'    Wheelchair 50 feet with 2 turns activity    Assist        Assist Level: Set up assist, Supervision/Verbal cueing    Wheelchair 150 feet activity     Assist     Assist Level: Set up assist, Supervision/Verbal cueing      Medical Problem List and Plan: 1.Functional and mobility deficitssecondary to PAD, sepsis, chronic non-healing wounds LLE requiring wound washout. Pt significantly debilitated  Continue CIR 2. Antithrombotics: -h/o DVT/anticoagulation:Pharmaceutical:Heparin--Xarelto on hold -antiplatelet therapy: Plavix on hold. 3. Pain Management:Continue Lyrica at bedtime 4. Mood:LCSW to follow for evaluation and support -antipsychotic agents: N/A 5. Neuropsych:this patient iscapable of making decisions onhisown behalf.  Continue to provide positive encouragement 6. Skin/Wound Care:Wet-to-dry dressings dailyto LLE. 7. Fluids/Electrolytes/Nutrition:Monitor I's and O's.  Nutritional supplements for wound healing. 8.Strep B wound culture: Continue IV Ancef 9.T2DM: Monitor blood sugars AC at bedtime--continue to titrate Lantus upwards for tighter blood sugar control  NovoLog increased to 9 TID on 7/6, increased to 11 3 times daily on 7/10  Elevated on 7/11, however we will not make any changes given hypoglycemia on 7/10  Monitor with increased mobility 10 Iron deficiency: Continue to monitor with serial checks.             -iron supplement            Hemoglobin 8.3 on 7/8, labs ordered for Monday  Continue to monitor 11.Constipation: On miralax.  12.  Hyponatremia  Sodium 130 on 7/8, labs ordered for Monday  Continue to monitor 13.  Hypoalbuminemia  Supplement initiated on 7/4 14.  Right knee OA  Ice for left knee pain, PRAFO was ordered to help with positioning-kickstand needs to be out  Knee x-ray personally reviewed and reviewed with patient.  Attempted to review films from last month, however technical limitations.  Discussed with PA, discussed with radiology-no significant changes and osteopenia  Voltaren gel started on 7/9  Continue  ice  WBCs reviewed again, within normal limits and afebrile today  Attempted to aspirate on 7/10, however patient exquisitely tender even to palpation when trying to identify bony landmarks; was not able to tolerate.  Patient states this is a chronic problem that has been going on for years is exacerbated for a few days every few months, during which he takes NSAIDs ambulates, which resolves the pain.  He states he never followed up with anyone, despite his wife's recommendations, due to the fact that it self resolved. 15.  Orthostasis  Orthostatic hypotension noted on 7/11  Abdominal binder as necessary  LOS: 8 days A FACE TO FACE EVALUATION WAS PERFORMED  Lorelee Mclaurin Lorie Phenix 01/03/2019, 10:59 AM

## 2019-01-03 NOTE — Plan of Care (Signed)
  Problem: Consults Goal: RH GENERAL PATIENT EDUCATION Description: See Patient Education module for education specifics. Outcome: Progressing   Problem: RH SKIN INTEGRITY Goal: RH STG SKIN FREE OF INFECTION/BREAKDOWN Description: With min assist Outcome: Progressing Goal: RH STG MAINTAIN SKIN INTEGRITY WITH ASSISTANCE Description: STG Maintain Skin Integrity With min Assistance. Outcome: Progressing Goal: RH STG ABLE TO PERFORM INCISION/WOUND CARE W/ASSISTANCE Description: STG Able To Perform Incision/Wound Care With  min Assistance. Outcome: Progressing   Problem: RH SAFETY Goal: RH STG ADHERE TO SAFETY PRECAUTIONS W/ASSISTANCE/DEVICE Description: STG Adhere to Safety Precautions With cues/reminders Outcome: Progressing   Problem: RH PAIN MANAGEMENT Goal: RH STG PAIN MANAGED AT OR BELOW PT'S PAIN GOAL Description: Manage pain at or below level 4 Outcome: Progressing   Problem: RH KNOWLEDGE DEFICIT GENERAL Goal: RH STG INCREASE KNOWLEDGE OF SELF CARE AFTER HOSPITALIZATION Outcome: Progressing

## 2019-01-04 ENCOUNTER — Inpatient Hospital Stay (HOSPITAL_COMMUNITY): Payer: BC Managed Care – PPO | Admitting: Occupational Therapy

## 2019-01-04 DIAGNOSIS — L089 Local infection of the skin and subcutaneous tissue, unspecified: Secondary | ICD-10-CM

## 2019-01-04 DIAGNOSIS — T148XXA Other injury of unspecified body region, initial encounter: Secondary | ICD-10-CM

## 2019-01-04 LAB — GLUCOSE, CAPILLARY
Glucose-Capillary: 119 mg/dL — ABNORMAL HIGH (ref 70–99)
Glucose-Capillary: 143 mg/dL — ABNORMAL HIGH (ref 70–99)
Glucose-Capillary: 175 mg/dL — ABNORMAL HIGH (ref 70–99)
Glucose-Capillary: 189 mg/dL — ABNORMAL HIGH (ref 70–99)

## 2019-01-04 NOTE — Progress Notes (Signed)
[place] PHYSICAL MEDICINE & REHABILITATION PROGRESS NOTE  Subjective/Complaints: Patient seen laying in bed this morning.  He states he slept well overnight.  He notes improvement in pain.  He states he would like to ambulate without an assistive device at discharge.  Discussed realistic expectations.  He has questions regarding which bacteria tested positive for.  ROS: Denies CP, shortness of breath, nausea, vomiting, diarrhea.  Objective: Vital Signs: Blood pressure (!) 142/84, pulse 61, temperature 97.8 F (36.6 C), resp. rate 18, weight 98.4 kg, SpO2 97 %. No results found. No results for input(s): WBC, HGB, HCT, PLT in the last 72 hours. No results for input(s): NA, K, CL, CO2, GLUCOSE, BUN, CREATININE, CALCIUM in the last 72 hours.  Physical Exam: BP (!) 142/84 (BP Location: Left Arm)   Pulse 61   Temp 97.8 F (36.6 C)   Resp 18   Wt 98.4 kg   SpO2 97%   BMI 28.62 kg/m  Constitutional: No distress . Vital signs reviewed. HENT: Normocephalic and atraumatic. Eyes: EOMI.  No discharge. Cardiovascular: No JVD. Respiratory: Normal effort. GI: Non-distended. Musc: Left foot with digit mutations 2-5 Left lower extremity with edema and minimal tenderness Neurological: He is alert and oriented Motor: Bilateral upper extremity: 5/5 proximal and distal Right lower extremity: 4+/5 proximal distal, stable Left lower extremity: Hip flexion 3+/5, knee extension 3+/5, ankle dorsiflexion 3+/5 (some pain inhibition), stable Skin: Left lower extremity with dressing C/D/I (please see media tab).  No erythema around it. psychiatric: Normal mood.  Normal behavior.  Assessment/Plan: 1. Functional deficits secondary to debility which require 3+ hours per day of interdisciplinary therapy in a comprehensive inpatient rehab setting.  Physiatrist is providing close team supervision and 24 hour management of active medical problems listed below.  Physiatrist and rehab team continue to  assess barriers to discharge/monitor patient progress toward functional and medical goals  Care Tool:  Bathing    Body parts bathed by patient: Right arm, Left arm, Chest, Abdomen, Front perineal area, Buttocks, Right upper leg, Left upper leg, Right lower leg, Face   Body parts bathed by helper: Buttocks, Right lower leg, Left lower leg Body parts n/a: Left lower leg   Bathing assist Assist Level: Minimal Assistance - Patient > 75%     Upper Body Dressing/Undressing Upper body dressing   What is the patient wearing?: Pull over shirt    Upper body assist Assist Level: Set up assist    Lower Body Dressing/Undressing Lower body dressing      What is the patient wearing?: Pants     Lower body assist Assist for lower body dressing: Minimal Assistance - Patient > 75%     Toileting Toileting    Toileting assist Assist for toileting: Minimal Assistance - Patient > 75%     Transfers Chair/bed transfer  Transfers assist     Chair/bed transfer assist level: Minimal Assistance - Patient > 75%     Locomotion Ambulation   Ambulation assist   Ambulation activity did not occur: Safety/medical concerns  Assist level: Minimal Assistance - Patient > 75% Assistive device: Walker-rolling Max distance: 53ft   Walk 10 feet activity   Assist  Walk 10 feet activity did not occur: Safety/medical concerns  Assist level: Minimal Assistance - Patient > 75% Assistive device: Walker-rolling   Walk 50 feet activity   Assist Walk 50 feet with 2 turns activity did not occur: Safety/medical concerns         Walk 150 feet activity  Assist Walk 150 feet activity did not occur: Safety/medical concerns         Walk 10 feet on uneven surface  activity   Assist Walk 10 feet on uneven surfaces activity did not occur: Safety/medical concerns         Wheelchair     Assist Will patient use wheelchair at discharge?: Yes Type of Wheelchair: Manual     Wheelchair assist level: Supervision/Verbal cueing Max wheelchair distance: 117ft    Wheelchair 50 feet with 2 turns activity    Assist        Assist Level: Supervision/Verbal cueing   Wheelchair 150 feet activity     Assist     Assist Level: Supervision/Verbal cueing      Medical Problem List and Plan: 1.Functional and mobility deficitssecondary to PAD, sepsis, chronic non-healing wounds LLE requiring wound washout. Pt significantly debilitated  Continue CIR 2. Antithrombotics: -h/o DVT/anticoagulation:Pharmaceutical:Heparin--Xarelto on hold -antiplatelet therapy: Plavix on hold. 3. Pain Management:Continue Lyrica at bedtime 4. Mood:LCSW to follow for evaluation and support -antipsychotic agents: N/A 5. Neuropsych:this patient iscapable of making decisions onhisown behalf.  Continue to provide positive encouragement 6. Skin/Wound Care:Wet-to-dry dressings dailyto LLE. 7. Fluids/Electrolytes/Nutrition:Monitor I's and O's.  Nutritional supplements for wound healing. 8.Strep B wound culture: Continue IV Ancef, will follow up on duration for therapy 9.T2DM: Monitor blood sugars AC at bedtime--continue to titrate Lantus upwards for tighter blood sugar control  NovoLog increased to 9 TID on 7/6, increased to 11 3 times daily on 7/10  Labile on 7/12  Monitor with increased mobility 10 Iron deficiency: Continue to monitor with serial checks.             -iron supplement            Hemoglobin 8.3 on 7/8, labs ordered for tomorrow  Continue to monitor 11.Constipation: On miralax.  12.  Hyponatremia  Sodium 130 on 7/8, labs ordered for tomorrow  Continue to monitor 13.  Hypoalbuminemia  Supplement initiated on 7/4 14.  Right knee OA  Ice for left knee pain, PRAFO was ordered to help with positioning-kickstand needs to be out  Knee x-ray personally reviewed and reviewed with patient.  Attempted to review films from  last month, however technical limitations.  Discussed with PA, discussed with radiology-no significant changes and osteopenia  Voltaren gel started on 7/9  Continue ice  WBCs reviewed again, within normal limits and afebrile today  Attempted to aspirate on 7/10, however patient exquisitely tender even to palpation when trying to identify bony landmarks; was not able to tolerate.  Patient states this is a chronic problem that has been going on for years is exacerbated for a few days every few months, during which he takes NSAIDs ambulates, which resolves the pain.  He states he never followed up with anyone, despite his wife's recommendations, due to the fact that it self resolved.  Improving 15.  Orthostasis  Orthostatic hypotension noted on 7/11, negative on 7/12  Abdominal binder as necessary  LOS: 9 days A FACE TO FACE EVALUATION WAS PERFORMED  [name] Lorie Phenix 01/04/2019, 10:28 AM

## 2019-01-04 NOTE — Progress Notes (Signed)
Occupational Therapy Session Note  Patient Details  Name: [name] MRN: [id number] Date of Birth: 21-Apr-1951  Today's Date: 01/04/2019 OT Individual Time: 0950-1100 OT Individual Time Calculation (min): 70 min    Short Term Goals: Week 2:  OT Short Term Goal 1 (Week 2): Pt will sit to stand with consistent mod A in prep for clothing managment OT Short Term Goal 2 (Week 2): Pt will sit to stand with consistent mod A in prep for clothing managment OT Short Term Goal 3 (Week 2): Pt will complete LB dressing tasks with mod A OT Short Term Goal 4 (Week 2): Pt will completes toileting tasks with CGA  Skilled Therapeutic Interventions/Progress Updates:    Treatment session with focus on ADL retraining, functional transfers, and sit <> stand for LB dressing.  Pt received supine in bed agreeable to bathing/dressing at sink. Pt completed bed mobility with min assist to guide LLE.  Completed lateral scoot transfer bed > w/c with CGA.  Once upright, pt reports need to toilet.  Completed lateral scoot on to drop arm BSC with CGA and completed clothing management with lateral leans.  Upon return to w/c pt required min assist during lateral scoot to decrease friction due to no longer wearing pants.  Engaged in bathing/dressing at sink with lateral leans to wash buttocks and then sit > stand when completing LB dressing.  Pt utilized reacher to assist with LB dressing, requiring assistance when threading Lt pant leg and then assistance to pull pants over hips while in standing with UBE on RW.  Pt required frequent rest breaks during session.  Discussed d/c plan with pt goals to increase standing and ambulation.    Therapy Documentation Precautions:  Precautions Precautions: Fall Restrictions Weight Bearing Restrictions: No Pain: Pain Assessment Pain Scale: 0-10 Pain Score: 3  Pain Location: Knee Pain Orientation: Left Pain Intervention(s): Medication (See eMAR)   Therapy/Group: Individual  Therapy  Simonne Come 01/04/2019, 12:19 PM

## 2019-01-04 NOTE — Plan of Care (Signed)
  Problem: Consults Goal: RH GENERAL PATIENT EDUCATION Description: See Patient Education module for education specifics. Outcome: Progressing   Problem: RH SKIN INTEGRITY Goal: RH STG SKIN FREE OF INFECTION/BREAKDOWN Description: With min assist Outcome: Progressing Goal: RH STG MAINTAIN SKIN INTEGRITY WITH ASSISTANCE Description: STG Maintain Skin Integrity With min Assistance. Outcome: Progressing Goal: RH STG ABLE TO PERFORM INCISION/WOUND CARE W/ASSISTANCE Description: STG Able To Perform Incision/Wound Care With  min Assistance. Outcome: Progressing   Problem: RH SAFETY Goal: RH STG ADHERE TO SAFETY PRECAUTIONS W/ASSISTANCE/DEVICE Description: STG Adhere to Safety Precautions With cues/reminders Outcome: Progressing   Problem: RH PAIN MANAGEMENT Goal: RH STG PAIN MANAGED AT OR BELOW PT'S PAIN GOAL Description: Manage pain at or below level 4 Outcome: Progressing   Problem: RH KNOWLEDGE DEFICIT GENERAL Goal: RH STG INCREASE KNOWLEDGE OF SELF CARE AFTER HOSPITALIZATION Outcome: Progressing

## 2019-01-05 ENCOUNTER — Inpatient Hospital Stay (HOSPITAL_COMMUNITY): Payer: BC Managed Care – PPO

## 2019-01-05 ENCOUNTER — Encounter (HOSPITAL_COMMUNITY): Payer: BC Managed Care – PPO

## 2019-01-05 ENCOUNTER — Ambulatory Visit (HOSPITAL_COMMUNITY): Payer: BC Managed Care – PPO

## 2019-01-05 LAB — CBC
HCT: 29.5 % — ABNORMAL LOW (ref 39.0–52.0)
Hemoglobin: 9.5 g/dL — ABNORMAL LOW (ref 13.0–17.0)
MCH: 30.3 pg (ref 26.0–34.0)
MCHC: 32.2 g/dL (ref 30.0–36.0)
MCV: 93.9 fL (ref 80.0–100.0)
Platelets: 140 10*3/uL — ABNORMAL LOW (ref 150–400)
RBC: 3.14 MIL/uL — ABNORMAL LOW (ref 4.22–5.81)
RDW: 20.6 % — ABNORMAL HIGH (ref 11.5–15.5)
WBC: 8.6 10*3/uL (ref 4.0–10.5)
nRBC: 0 % (ref 0.0–0.2)

## 2019-01-05 LAB — BASIC METABOLIC PANEL
Anion gap: 9 (ref 5–15)
BUN: 27 mg/dL — ABNORMAL HIGH (ref 8–23)
CO2: 22 mmol/L (ref 22–32)
Calcium: 7.9 mg/dL — ABNORMAL LOW (ref 8.9–10.3)
Chloride: 103 mmol/L (ref 98–111)
Creatinine, Ser: 0.93 mg/dL (ref 0.61–1.24)
GFR calc Af Amer: >60 mL/min (ref >60)
GFR calc non Af Amer: >60 mL/min (ref >60)
Glucose, Bld: 128 mg/dL — ABNORMAL HIGH (ref 70–99)
Potassium: 4 mmol/L (ref 3.5–5.1)
Sodium: 134 mmol/L — ABNORMAL LOW (ref 135–145)

## 2019-01-05 LAB — GLUCOSE, CAPILLARY
Glucose-Capillary: 107 mg/dL — ABNORMAL HIGH (ref 70–99)
Glucose-Capillary: 96 mg/dL (ref 70–99)
Glucose-Capillary: 91 mg/dL (ref 70–99)
Glucose-Capillary: 116 mg/dL — ABNORMAL HIGH (ref 70–99)

## 2019-01-05 NOTE — Progress Notes (Signed)
Dressings changed on LLE --wound bed looks good but lower two wounds have developed tracks. Middle wound has 2 cm track with bloody drainage with small clots when probed. Most distal wound has approx 3 cm track and couple other midline areas that look friable and serous drainage expressed when track probed. Tracks packed with gauze but may need iodoform gauze to get to the base.  Secure message sent to Dr. Donzetta Matters for input.   He continues on IV antibiotics--day # 18--> change need to po Augmentin for wound prophylaxis and question  Duration of therapy.  roximal--thigh wound.   LLE middle wound--approx 2 cm depth probed LLE --most distal wound with 3.2 cm track

## 2019-01-05 NOTE — Progress Notes (Signed)
   Patient evaluated at bedside.  Dressing noted to be clean dry intact.  Should be okay for no further antibiotics from vascular standpoint.  Laterrica Libman C. Donzetta Matters, MD [facility] Office: [phone] Pager: [phone]

## 2019-01-05 NOTE — Progress Notes (Signed)
[place] PHYSICAL MEDICINE & REHABILITATION PROGRESS NOTE  Subjective/Complaints: Patient seen laying in bed this morning.  He states he slept fairly overnight.  He notes improvement in strength.  Discussed antibiotics.  ROS: Denies CP, shortness of breath, nausea, vomiting, diarrhea.  Objective: Vital Signs: Blood pressure (!) 150/87, pulse 61, temperature 98.4 F (36.9 C), temperature source Oral, resp. rate 18, weight 98.7 kg, SpO2 99 %. No results found. Recent Labs    01/05/19 0344  WBC 8.6  HGB 9.5*  HCT 29.5*  PLT 140*   Recent Labs    01/05/19 0344  NA 134*  K 4.0  CL 103  CO2 22  GLUCOSE 128*  BUN 27*  CREATININE 0.93  CALCIUM 7.9*    Physical Exam: BP (!) 150/87 (BP Location: Left Arm)   Pulse 61   Temp 98.4 F (36.9 C) (Oral)   Resp 18   Wt 98.7 kg   SpO2 99%   BMI 28.71 kg/m  Constitutional: No distress . Vital signs reviewed. HENT: Normocephalic.  Atraumatic.   Eyes: EOMI.  No discharge. Cardiovascular: No JVD. Respiratory: Normal effort. GI: Non-distended. Musc: Left foot with digit mutations 2-5 Left lower extremity with edema and minimal tenderness Neurological: He is alert and oriented Motor: Bilateral upper extremity: 5/5 proximal and distal Right lower extremity: 4+/5 proximal distal, improving Left lower extremity: Hip flexion 3+/5, knee extension 3+/5, ankle dorsiflexion 3+/5 (some pain inhibition), unchanged Skin: Left lower extremity with dressing C/D/I (please see media tab).  No erythema around it. psychiatric: Normal mood.  Normal behavior.  Assessment/Plan: 1. Functional deficits secondary to debility which require 3+ hours per day of interdisciplinary therapy in a comprehensive inpatient rehab setting.  Physiatrist is providing close team supervision and 24 hour management of active medical problems listed below.  Physiatrist and rehab team continue to assess barriers to discharge/monitor patient progress toward functional  and medical goals  Care Tool:  Bathing    Body parts bathed by patient: Right arm, Left arm, Chest, Abdomen, Front perineal area, Buttocks, Right upper leg, Left upper leg, Right lower leg, Face   Body parts bathed by helper: Buttocks, Right lower leg, Left lower leg Body parts n/a: Left lower leg   Bathing assist Assist Level: Minimal Assistance - Patient > 75%     Upper Body Dressing/Undressing Upper body dressing   What is the patient wearing?: Pull over shirt    Upper body assist Assist Level: Set up assist    Lower Body Dressing/Undressing Lower body dressing      What is the patient wearing?: Pants     Lower body assist Assist for lower body dressing: Moderate Assistance - Patient 50 - 74%     Toileting Toileting    Toileting assist Assist for toileting: Minimal Assistance - Patient > 75%     Transfers Chair/bed transfer  Transfers assist     Chair/bed transfer assist level: Contact Guard/Touching assist     Locomotion Ambulation   Ambulation assist   Ambulation activity did not occur: Safety/medical concerns  Assist level: Minimal Assistance - Patient > 75% Assistive device: Walker-rolling Max distance: 47ft   Walk 10 feet activity   Assist  Walk 10 feet activity did not occur: Safety/medical concerns  Assist level: Minimal Assistance - Patient > 75% Assistive device: Walker-rolling   Walk 50 feet activity   Assist Walk 50 feet with 2 turns activity did not occur: Safety/medical concerns         Walk 150 feet  activity   Assist Walk 150 feet activity did not occur: Safety/medical concerns         Walk 10 feet on uneven surface  activity   Assist Walk 10 feet on uneven surfaces activity did not occur: Safety/medical concerns         Wheelchair     Assist Will patient use wheelchair at discharge?: Yes Type of Wheelchair: Manual    Wheelchair assist level: Supervision/Verbal cueing Max wheelchair distance:  16ft    Wheelchair 50 feet with 2 turns activity    Assist        Assist Level: Supervision/Verbal cueing   Wheelchair 150 feet activity     Assist     Assist Level: Supervision/Verbal cueing      Medical Problem List and Plan: 1.Functional and mobility deficitssecondary to PAD, sepsis, chronic non-healing wounds LLE requiring wound washout. Pt significantly debilitated  Continue CIR 2. Antithrombotics: -h/o DVT/anticoagulation:Pharmaceutical:Heparin--Xarelto on hold -antiplatelet therapy: Plavix on hold. 3. Pain Management:Continue Lyrica at bedtime 4. Mood:LCSW to follow for evaluation and support -antipsychotic agents: N/A 5. Neuropsych:this patient iscapable of making decisions onhisown behalf.  Continue to provide positive encouragement 6. Skin/Wound Care:Wet-to-dry dressings dailyto LLE. 7. Fluids/Electrolytes/Nutrition:Monitor I's and O's.  Nutritional supplements for wound healing. 8.Strep B wound culture: Continue IV Ancef, will follow-up with ID for duration of therapy 9.T2DM: Monitor blood sugars AC at bedtime--continue to titrate Lantus upwards for tighter blood sugar control  NovoLog incr 7/13   Labile on 7/13  Monitor with increased mobility 10 Iron deficiency: Continue to monitor with serial checks.             -iron supplement            Hemoglobin 9.5 on 7/13  Continue to monitor 11.Constipation: On miralax.  12.  Hyponatremia  Sodium 134 on 7/13  Continue to monitor 13.  Hypoalbuminemia  Supplement initiated on 7/4 14.  Right knee OA  Ice for left knee pain, PRAFO was ordered to help with positioning-kickstand needs to be out  Knee x-ray personally reviewed and reviewed with patient.  Attempted to review films from last month, however technical limitations.  Discussed with PA, discussed with radiology-no significant changes and osteopenia  Voltaren gel started on 7/9  Continue ice  WBCs  reviewed again, within normal limits and afebrile today  Attempted to aspirate on 7/10, however patient exquisitely tender even to palpation when trying to identify bony landmarks; was not able to tolerate.  Patient states this is a chronic problem that has been going on for years is exacerbated for a few days every few months, during which he takes NSAIDs ambulates, which resolves the pain.  He states he never followed up with anyone, despite his wife's recommendations, due to the fact that it self resolved.  Improving 15.  Orthostasis  Orthostatic hypotension noted on 7/13  Abdominal binder as necessary  LOS: 10 days A FACE TO FACE EVALUATION WAS PERFORMED  Jaece Ducharme Lorie Phenix 01/05/2019, 8:17 AM

## 2019-01-05 NOTE — Progress Notes (Signed)
Occupational Therapy Session Note  Patient Details  Name: [name] MRN: [id number] Date of Birth: August 31, 1950  Today's Date: 01/05/2019 OT Individual Time: 3729-0211 OT Individual Time Calculation (min): 71 min    Short Term Goals: Week 2:  OT Short Term Goal 1 (Week 2): Pt will sit to stand with consistent mod A in prep for clothing managment OT Short Term Goal 2 (Week 2): Pt will sit to stand with consistent mod A in prep for clothing managment OT Short Term Goal 3 (Week 2): Pt will complete LB dressing tasks with mod A OT Short Term Goal 4 (Week 2): Pt will completes toileting tasks with CGA  Skilled Therapeutic Interventions/Progress Updates:    Pt resting in bed upon arrival and requested use of BSC.  Pt performed scoot transfer to drop arm BSC and completed all toileting tasks with supervision.  Pt transferred back to bed and to w/c with supervisoin.  Pt completed grooming tasks seated in w/c at sink.   Pt propelled to gym and engaged in BUE therex: [employer]-8 mins random level 4 with one rest break 5# bar-overhead presses, chest pressess, and rowing-3X10 1 kg ball-diagonals-3x10  Pt returned to room and transferred back to bed.  Pt remained in bed with all needs within reach and bed alarm activated.  Therapy Documentation Precautions:  Precautions Precautions: Fall Restrictions Weight Bearing Restrictions: No  Pain:  Pt c/o 4/10 L knee pain; repositioned in bed at cpmpletion of therapy   Therapy/Group: Individual Therapy  Leroy Libman 01/05/2019, 9:29 AM

## 2019-01-05 NOTE — Progress Notes (Signed)
Occupational Therapy Session Note  Patient Details  Name: [name] HORNSTEIN MRN: [id number] Date of Birth: [birth year]  Today's Date: 01/05/2019 OT Individual Time: 1300-1355 OT Individual Time Calculation (min): 55 min    Short Term Goals: Week 2:  OT Short Term Goal 1 (Week 2): Pt will sit to stand with consistent mod A in prep for clothing managment OT Short Term Goal 2 (Week 2): Pt will sit to stand with consistent mod A in prep for clothing managment OT Short Term Goal 3 (Week 2): Pt will complete LB dressing tasks with mod A OT Short Term Goal 4 (Week 2): Pt will completes toileting tasks with CGA  Skilled Therapeutic Interventions/Progress Updates:    Pt resting in w/c upon arrival with wife present.  Wife present for education and to follow pt's progress.  OT intervention with focus on toilet and bed transfers, sit<>stand, toileting, discharge planning, and safety awareness.  Pt performed scoot tranfsers to drop arm BSC and bed with supervision.  Pt completed toileting tasks using lateral leans with supervision.  Discussed placement of BSC at home and recommendation to place beside bed during night. Discussed bathroom arrangement at home.  Bathroom not w/c accessible.  Pt and wife pleased with progress.  Pt remained in w/c with wife present.  All needs within reach.   Therapy Documentation Precautions:  Precautions Precautions: Fall Restrictions Weight Bearing Restrictions: No Pain:  Pt c/o increased tenderness proximal to L patella; ice pack applied   Therapy/Group: Individual Therapy  Leroy Libman 01/05/2019, 2:01 PM

## 2019-01-05 NOTE — Progress Notes (Signed)
Physical Therapy Session Note  Patient Details  Name: [name] MRN: [id number] Date of Birth: 11/22/1950  Today's Date: 01/05/2019 PT Individual Time: 1100-1205 PT Individual Time Calculation (min): 65 min   Short Term Goals:  Week 2:  PT Short Term Goal 1 (Week 2): Patient will perform all bed mobility with supervision. PT Short Term Goal 2 (Week 2): Patient will demonstrate partial to full weight bearing on L LE in standing as tolerated. PT Short Term Goal 3 (Week 2): Patient will perform sit<>stand with RW with mod A. PT Short Term Goal 4 (Week 2): Patient will ambulate 20 feet with LRAD with min A.  Skilled Therapeutic Interventions/Progress Updates:   Pt resting in bed.  Pain 5/10 L knee cap, premedicated.  [name] arrived.  She brought shoes.  Supine> sit with extra time, managing LLE himself, superviison.Pt donned R shoe with supervision, L shoe by PT.  Transfer training sitting EOB, bil hands clasped in front, reaching forward x 10 to transfer weight over feet.  From raised bed, sit> stand with min assist to RW.  Squat pivot bed> w/c to R with CGA.  W/c propulsion on level tile with supervision, x 150', supervision.  Cues for efficiency.  Gait training over level tile x 28' with min assist to stand, CGA, with RW.  neuromuscular re-education via forced use , positioning and multimodal cues for bil alternating reciprocal movement using [employer] from w/c level, 40 cm/sec resistance x 30 cycles targeting quadriceps, at 30 cm/sec x 25 cycles, targeting gluteal muscles.  Unilateral use of LLE, resistance 40 cm/sec x 20 cycles and slight resistance from PT.   At end of session, pt resting in w/c with needs at hand, set up for lunch.  Wife present.     Therapy Documentation Precautions:  Precautions Precautions: Fall Restrictions Weight Bearing Restrictions: No        Therapy/Group: Individual Therapy  [name] 01/05/2019, 12:16 PM

## 2019-01-05 NOTE — Progress Notes (Signed)
Social Work Patient ID: [name], male   DOB: [birth year], 68 y.o.   MRN: [id number] Met with wife and pt wife was here for education and to see pt in his therapies. Both pleased with his progress and wife aware of change in discharge date to 7/22. Wife would like to com back closer to his discharge next week to go through therapies again. Will schedule next week and work on discharge needs.

## 2019-01-06 ENCOUNTER — Inpatient Hospital Stay (HOSPITAL_COMMUNITY): Payer: BC Managed Care – PPO

## 2019-01-06 ENCOUNTER — Inpatient Hospital Stay (HOSPITAL_COMMUNITY): Payer: BC Managed Care – PPO | Admitting: Physical Therapy

## 2019-01-06 LAB — GLUCOSE, CAPILLARY
Glucose-Capillary: 147 mg/dL — ABNORMAL HIGH (ref 70–99)
Glucose-Capillary: 77 mg/dL (ref 70–99)
Glucose-Capillary: 136 mg/dL — ABNORMAL HIGH (ref 70–99)
Glucose-Capillary: 127 mg/dL — ABNORMAL HIGH (ref 70–99)

## 2019-01-06 MED ORDER — CLOPIDOGREL BISULFATE 75 MG PO TABS
75.0000 mg | ORAL_TABLET | Freq: Every day | ORAL | Status: DC
  Administered 2019-01-06 – 2019-01-14 (×9): 75 mg via ORAL
  Filled 2019-01-06 (×9): qty 1

## 2019-01-06 MED ORDER — RIVAROXABAN 10 MG PO TABS
10.0000 mg | ORAL_TABLET | Freq: Every day | ORAL | Status: DC
  Administered 2019-01-06 – 2019-01-13 (×8): 10 mg via ORAL
  Filled 2019-01-06 (×8): qty 1

## 2019-01-06 NOTE — Progress Notes (Signed)
Physical Therapy Session Note  Patient Details  Name: [name] MRN: [id number] Date of Birth: 05-15-1951  Today's Date: 01/06/2019 PT Individual Time: 1106-1206 PT Individual Time Calculation (min): 60 min   Short Term Goals: Week 2:  PT Short Term Goal 1 (Week 2): Patient will perform all bed mobility with supervision. PT Short Term Goal 2 (Week 2): Patient will demonstrate partial to full weight bearing on L LE in standing as tolerated. PT Short Term Goal 3 (Week 2): Patient will perform sit<>stand with RW with mod A. PT Short Term Goal 4 (Week 2): Patient will ambulate 20 feet with LRAD with min A.  Skilled Therapeutic Interventions/Progress Updates:   Pt received sitting in w/c with L LE elevated on EOB - min assist for L LE management on leg rest. Pt agreeable to therapy session. Performed ~30ft of B UE w/c propulsion with supervision then pt reports UE fatigue and therapist pushed remainder of distance to day room. Squat pivot w/c>EOM with max cuing for proper set-up of transfer and supervision for safety during transfer. Sit<>stand elevated EOM<>RW with CGA for balance. Performed 3 bouts of pre-gait training focusing on increased L LE weightbearing - mirror feedback for upright posture and lateral weight shifting - manual facilitation for L weight shift and increased L LE hip/knee extension during stance. Ambulated ~54ft using RW with min assist for balance with manual facilitation for increased L LE hip/knee extension and cuing for increased upright trunk posture - pt continues to rely heavily on B UE support with decreased L LE weightbearing. Pt demonstrates significantly decreased L ankle PF/DF and L knee flexion/extension ROM. Squat pivot w/c>EOM with close supervision for safety and cuing for LE management. Sit>supine with supervision and pt using R LE to assist with L LE management. Performed supine L LE ankle PF/DF AAROM with overpressure for increased ROM as well as L knee  extension/flexion ROM 3 bouts each with over-pressure/oscillations into extension and active assisted ROM into repeated knee flexion - pt reports increased pain with knee flexion. Supine>sit with supervision. Squat pivot EOM>w/c with supervision for safety. Transported back to room in w/c. Therapist provided different cushion in w/c for improved pressure relief as pt spends extended amounts of time in w/c during the day. RN notified of pt's increased L LE pain and present for medication administration. Pt left sitting in w/c with needs in reach and seat belt alarm on.  Therapy Documentation Precautions:  Precautions Precautions: Fall Restrictions Weight Bearing Restrictions: No  Pain: Reports pain level in L LE at beginning of session rated as 4-5/10 then increased to 8-9/10 during session - provided rest breaks in comfortable position throughout session as pt reports this decreases pain level. RN present at end of session for medication administration.   Therapy/Group: Individual Therapy  Tawana Scale, PT, DPT 01/06/2019, 7:55 AM

## 2019-01-06 NOTE — Progress Notes (Signed)
[facility] PHYSICAL MEDICINE & REHABILITATION PROGRESS NOTE  Subjective/Complaints: Patient seen laying in bed this morning.  States he slept fairly overnight.  He states he was seen by vascular and discussed wound care, notes reviewed- no need for antibiotics.  He notes improvement in strength.  ROS: Denies CP, shortness of breath, nausea, vomiting, diarrhea.  Objective: Vital Signs: Blood pressure 120/87, pulse 74, temperature 98.3 F (36.8 C), temperature source Oral, resp. rate 18, weight 102.4 kg, SpO2 99 %. No results found. Recent Labs    01/05/19 0344  WBC 8.6  HGB 9.5*  HCT 29.5*  PLT 140*   Recent Labs    01/05/19 0344  NA 134*  K 4.0  CL 103  CO2 22  GLUCOSE 128*  BUN 27*  CREATININE 0.93  CALCIUM 7.9*    Physical Exam: BP 120/87   Pulse 74   Temp 98.3 F (36.8 C) (Oral)   Resp 18   Wt 102.4 kg   SpO2 99%   BMI 29.78 kg/m  Constitutional: No distress . Vital signs reviewed. HENT: Normocephalic.  Atraumatic. Eyes: EOMI.  No discharge. Cardiovascular: No JVD. Respiratory: Normal effort. GI: Non-distended. Musc: Left foot with digit mutations 2-5 Left lower extremity with edema and minimal tenderness Neurological: He is alert and oriented Motor:  Right lower extremity: 4+/5 proximal distal, improving Left lower extremity: Hip flexion 3+/5, knee extension 3+/5, ankle dorsiflexion 3+/5 (some pain inhibition), improving Skin: Left lower extremity with dressing C/D/I (please see PA note for pictures).  No erythema around it. psychiatric: Normal mood.  Normal behavior.  Assessment/Plan: 1. Functional deficits secondary to debility which require 3+ hours per day of interdisciplinary therapy in a comprehensive inpatient rehab setting.  Physiatrist is providing close team supervision and 24 hour management of active medical problems listed below.  Physiatrist and rehab team continue to assess barriers to discharge/monitor patient progress toward  functional and medical goals  Care Tool:  Bathing    Body parts bathed by patient: Right arm, Left arm, Chest, Abdomen, Front perineal area, Buttocks, Right upper leg, Left upper leg, Right lower leg, Face   Body parts bathed by helper: Buttocks, Right lower leg, Left lower leg Body parts n/a: Left lower leg   Bathing assist Assist Level: Minimal Assistance - Patient > 75%     Upper Body Dressing/Undressing Upper body dressing   What is the patient wearing?: Pull over shirt    Upper body assist Assist Level: Set up assist    Lower Body Dressing/Undressing Lower body dressing      What is the patient wearing?: Pants     Lower body assist Assist for lower body dressing: Moderate Assistance - Patient 50 - 74%     Toileting Toileting    Toileting assist Assist for toileting: Minimal Assistance - Patient > 75%     Transfers Chair/bed transfer  Transfers assist     Chair/bed transfer assist level: Minimal Assistance - Patient > 75%     Locomotion Ambulation   Ambulation assist   Ambulation activity did not occur: Safety/medical concerns  Assist level: Contact Guard/Touching assist Assistive device: Walker-rolling Max distance: 28   Walk 10 feet activity   Assist  Walk 10 feet activity did not occur: Safety/medical concerns  Assist level: Contact Guard/Touching assist Assistive device: Walker-rolling   Walk 50 feet activity   Assist Walk 50 feet with 2 turns activity did not occur: Safety/medical concerns         Walk 150 feet activity  Assist Walk 150 feet activity did not occur: Safety/medical concerns         Walk 10 feet on uneven surface  activity   Assist Walk 10 feet on uneven surfaces activity did not occur: Safety/medical concerns         Wheelchair     Assist Will patient use wheelchair at discharge?: Yes Type of Wheelchair: Manual    Wheelchair assist level: Supervision/Verbal cueing Max wheelchair distance:  183ft    Wheelchair 50 feet with 2 turns activity    Assist        Assist Level: Supervision/Verbal cueing   Wheelchair 150 feet activity     Assist     Assist Level: Supervision/Verbal cueing      Medical Problem List and Plan: 1.Functional and mobility deficitssecondary to PAD, sepsis, chronic non-healing wounds LLE requiring wound washout. Pt significantly debilitated  Continue CIR 2. Antithrombotics: -h/o DVT/anticoagulation:Pharmaceutical:Xarelto resumed -antiplatelet therapy: Plavix resumed. 3. Pain Management:Continue Lyrica at bedtime 4. Mood:LCSW to follow for evaluation and support -antipsychotic agents: N/A 5. Neuropsych:this patient iscapable of making decisions onhisown behalf.  Continue to provide positive encouragement 6. Skin/Wound Care:Wet-to-dry dressings dailyto LLE.  Wounds now with tracking-discussed with PA, will pack with iodoform gauze. 7. Fluids/Electrolytes/Nutrition:Monitor I/Os.  Nutritional supplements for wound healing. 8.Strep B wound culture: IV Ancef DC'd  9.T2DM: Monitor blood sugars AC at bedtime  NovoLog 11 units 3 times a day started on 7/10  Lantus 30 units daily at bedtime  Labile on 7/14  Monitor with increased mobility 10 Iron deficiency: Continue to monitor with serial checks.             -iron supplement            Hemoglobin 9.5 on 7/13, will order repeat labs given resumption of Plavix and Xarelto later this week  Continue to monitor 11.Constipation: On miralax.  12.  Hyponatremia  Sodium 134 on 7/13  Continue to monitor 13.  Hypoalbuminemia  Supplement initiated on 7/4 14.  Right knee OA  Ice for left knee pain, PRAFO was ordered to help with positioning-kickstand needs to be out  Knee x-ray personally reviewed and reviewed with patient.  Attempted to review films from last month, however technical limitations.  Discussed with PA, discussed with radiology-no  significant changes and osteopenia  Voltaren gel started on 7/9  Continue ice  WBCs reviewed again, within normal limits and afebrile today  Attempted to aspirate on 7/10, however patient exquisitely tender even to palpation when trying to identify bony landmarks; was not able to tolerate.  Patient states this is a chronic problem that has been going on for years is exacerbated for a few days every few months, during which he takes NSAIDs ambulates, which resolves the pain.  He states he never followed up with anyone, despite his wife's recommendations, due to the fact that it self resolved.  Improving 15.  Orthostasis  Labile blood pressure on 7/14  Abdominal binder as necessary  LOS: 11 days A FACE TO FACE EVALUATION WAS PERFORMED  Bawi Lakins Lorie Phenix 01/06/2019, 9:10 AM

## 2019-01-06 NOTE — Progress Notes (Signed)
Physical Therapy Session Note  Patient Details  Name: [name] MRN: [id number] Date of Birth: [birth year]  Today's Date: 01/06/2019 PT Individual Time: 1400-1500 PT Individual Time Calculation (min): 60 min   Short Term Goals: Week 2:  PT Short Term Goal 1 (Week 2): Patient will perform all bed mobility with supervision. PT Short Term Goal 2 (Week 2): Patient will demonstrate partial to full weight bearing on L LE in standing as tolerated. PT Short Term Goal 3 (Week 2): Patient will perform sit<>stand with RW with mod A. PT Short Term Goal 4 (Week 2): Patient will ambulate 20 feet with LRAD with min A.  Skilled Therapeutic Interventions/Progress Updates:     Patient in w/c upon PT arrival. Patient alert and agreeable to PT session.  Therapeutic Activity: Bed Mobility: Patient performed sit to supine with supervision in a flat bed without use of rails. Provided verbal cues for using his R LE to lift his L into the bed. Transfers: Patient performed sit to/from stand x3 and stand pivot x2 using a RW with mod-min A. Provided verbal cues for hand placement and leaning far forward.  Gait Training:  Patient ambulated 48 feet and 50 feet using a RW with CGA for safety/balance. Ambulated with forefoot initial contact and increased Knee flexion on L. Provided verbal cues for heel strike at initial contact and kneed extension in stance on L, patient demonstrated improvement with cues progressing to foot flat at initial contact and increased knee extension.  Wheelchair Mobility:  Patient propelled wheelchair 100 feet with supervision and set up assist. Provided verbal cues for turning technique, donning/doffing leg rests, and use of breaks throughout session.   Neuromuscular Re-ed: Patient performed standing balance and L LE stance in front of a mirror. Patient able to stand with L foot flat and increased L knee extension and quad activation with L weight shift. Able to increased weight bearing  to pain tolerance. He initiated standing focused on L LE weight bearing and progressed from B UE support on a RW to no UE support. Performed 3x 4-6 min each trial.  He performed pre-standing from EOM with RW for functional practice. Provided cues to lean forward until his buttocks lifted from the mat x6. Stood with CGA with the RW after.   Patient in bed at end of session with breaks locked, bed alarm set, and all needs within reach.    Therapy Documentation Precautions:  Precautions Precautions: Fall Restrictions Weight Bearing Restrictions: No Pain: Reported 6/10 L knee pain during session, stating it was very sore from prior therapies. Stated he had been pre-medicated prior to session. PT provided repositioning and distraction throughout session as pain interventions.    Therapy/Group: Individual Therapy  Ioanna Colquhoun L Joell Usman PT, DPT  01/06/2019, 4:51 PM

## 2019-01-06 NOTE — Progress Notes (Signed)
Occupational Therapy Session Note  Patient Details  Name: [name] MRN: [id number] Date of Birth: 05-Jun-1951  Today's Date: 01/06/2019 OT Individual Time: 0722-5750 OT Individual Time Calculation (min): 75 min    Short Term Goals: Week 2:  OT Short Term Goal 1 (Week 2): Pt will sit to stand with consistent mod A in prep for clothing managment OT Short Term Goal 2 (Week 2): Pt will sit to stand with consistent mod A in prep for clothing managment OT Short Term Goal 3 (Week 2): Pt will complete LB dressing tasks with mod A OT Short Term Goal 4 (Week 2): Pt will completes toileting tasks with CGA  Skilled Therapeutic Interventions/Progress Updates:    Pt resting in bed upon arrival.  OT intervention with focus on bed mobility, functional tranfsers, sit<>stand, standing balance, w/c mobility, BUE therex for increased endurance, and safety awareness to increase independence with BADLs.   Supine>sit EOB and scoot transfer to w/c-supervision Bathing with sit<>stand from w/c at sink-min A LB dressing with sit<>stand from w/c at sink-min A  BUE therex on [employer]-steady a level 5 for 8 mins  Pt concerned that he "feels" weaker then previous day.  Encouragement provided. Pt requires more than a reasonable amount of time for all tasks with multiple rest breaks.   Therapy Documentation Precautions:  Precautions Precautions: Fall Restrictions Weight Bearing Restrictions: No   Pain: Pt c/o 7/10 pain in L knee (proximal to patella); repositioned, emotional support  Therapy/Group: Individual Therapy  Leroy Libman 01/06/2019, 11:04 AM

## 2019-01-07 ENCOUNTER — Inpatient Hospital Stay (HOSPITAL_COMMUNITY): Payer: BC Managed Care – PPO

## 2019-01-07 ENCOUNTER — Inpatient Hospital Stay (HOSPITAL_COMMUNITY): Payer: BC Managed Care – PPO | Admitting: Physical Therapy

## 2019-01-07 DIAGNOSIS — Z86718 Personal history of other venous thrombosis and embolism: Secondary | ICD-10-CM

## 2019-01-07 LAB — GLUCOSE, CAPILLARY
Glucose-Capillary: 114 mg/dL — ABNORMAL HIGH (ref 70–99)
Glucose-Capillary: 95 mg/dL (ref 70–99)
Glucose-Capillary: 73 mg/dL (ref 70–99)
Glucose-Capillary: 151 mg/dL — ABNORMAL HIGH (ref 70–99)

## 2019-01-07 NOTE — Progress Notes (Signed)
Occupational Therapy Session Note  Patient Details  Name: [name] MRN: [id number] Date of Birth: January 27, 1951  Today's Date: 01/07/2019 OT Individual Time: 4098-1191 OT Individual Time Calculation (min): 71 min    Short Term Goals: Week 2:  OT Short Term Goal 1 (Week 2): Pt will sit to stand with consistent mod A in prep for clothing managment OT Short Term Goal 2 (Week 2): Pt will sit to stand with consistent mod A in prep for clothing managment OT Short Term Goal 3 (Week 2): Pt will complete LB dressing tasks with mod A OT Short Term Goal 4 (Week 2): Pt will completes toileting tasks with CGA  Skilled Therapeutic Interventions/Progress Updates:    Pt declined bathing/dressing this morning.  Pt engaged in standing tasks including assembling pipe structure, reaching for horse shoes and tossing. Pt performed sit<>stand X 4 with supervision.  Pt stood without BUE support to accomplish tasks.  Pt also engaged in BUE therex (5 mins X 2-opposite directoins-on level 6). Pt issued w/c gloves.  Pt returned to room and remained seated in w/c with all needs within reach.   Therapy Documentation Precautions:  Precautions Precautions: Fall Restrictions Weight Bearing Restrictions: No Pain:  Pt denies pain   Therapy/Group: Individual Therapy  Leroy Libman 01/07/2019, 10:27 AM

## 2019-01-07 NOTE — Progress Notes (Signed)
Physical Therapy Session Note  Patient Details  Name: [name] MRN: [id number] Date of Birth: [birth year]  Today's Date: 01/07/2019 PT Individual Time: 1108-1205 PT Individual Time Calculation (min): 57 min   Short Term Goals: Week 2:  PT Short Term Goal 1 (Week 2): Patient will perform all bed mobility with supervision. PT Short Term Goal 2 (Week 2): Patient will demonstrate partial to full weight bearing on L LE in standing as tolerated. PT Short Term Goal 3 (Week 2): Patient will perform sit<>stand with RW with mod A. PT Short Term Goal 4 (Week 2): Patient will ambulate 20 feet with LRAD with min A.  Skilled Therapeutic Interventions/Progress Updates: Pt presented in w/c agreeable to therapy. Pt stating increased soreness/pain in L knee, contributes to staying in w/c with ELR this am. Pt propelled to rehab gym with brief intermittent rests due to per pt increased resistance from w/c for propulsion. (PTA also observed this when pulling w/c during ambulation at end of session). Pt performed stand pivot to mat CGA and pt required increased time upon standing due to increased pain/soreness in RLE, nsg advised and pt received pain meds during session. Pt participated in toe taps to 4in step however pt was unable to provide enough wt bearing on LLE to place RLE on step. Pt was able to perform activity on 2in step with manual facilitation at hips for wt shifting to L. Pt also participated in placing clothespins on basketball net with L bias for wt bearing tolerance. Performed gait training 43f x 1 with w/c follow and CGA to close S. Pt noted to land with flat foot and place heavy use of BUE to decrease wt bearing on LLE. Pt transported remaining distance to room and pt left in w/c with call bell within reach and needs met.      Therapy Documentation Precautions:  Precautions Precautions: Fall Restrictions Weight Bearing Restrictions: No   Therapy/Group: Individual Therapy  Dhani Dannemiller   Lulia Schriner, PTA  01/07/2019, 12:48 PM

## 2019-01-07 NOTE — Progress Notes (Signed)
Physical Therapy Session Note  Patient Details  Name: [name] MRN: [id number] Date of Birth: [birth year]  Today's Date: 01/07/2019 PT Individual Time: 0800-0900 PT Individual Time Calculation (min): 60 min   Short Term Goals: Week 2:  PT Short Term Goal 1 (Week 2): Patient will perform all bed mobility with supervision. PT Short Term Goal 2 (Week 2): Patient will demonstrate partial to full weight bearing on L LE in standing as tolerated. PT Short Term Goal 3 (Week 2): Patient will perform sit<>stand with RW with mod A. PT Short Term Goal 4 (Week 2): Patient will ambulate 20 feet with LRAD with min A.     Skilled Therapeutic Interventions/Progress Updates:   Pt sitting on EOB.  He doffed non slip socks and donned socks and shoes with set up.  From raised bed, sit to stand with superviision.  Stand pivot to wc with RW, CGA.  Pt able to tolerate regular footrest for LLE.  PT wrote on safety plan that pt is to use ELR when resting inw/c at room.W/c propulsion x 50' with supervision.  R armrest rubs against wheel, limiting ease of propulsion.  Pt managed bil footrests independently.  Gait training through 3 cones for obstacle course, RW, close supervision.  Gait x 40' on level tile, close superivison.  Stand pivot to NuStep.  Use of NuStep for flexibility L knee, x 4 minutes, using bil UEs to control speed, at level 4.  Seated in w/c, 20 x 1 L long arc quad knee extension, 10 x 1 R/L hip flexion.   At end of session, pt resting in w/c with L ELR on w/c, needs at hand.      Therapy Documentation Precautions:  Precautions Precautions: Fall Restrictions Weight Bearing Restrictions: No  Pain:- unable to rate, " I have had the pain so long I don't really know what it feels like not to have it." premedicated L knee with Voltaren.         Therapy/Group: Individual Therapy  Raesha Coonrod 01/07/2019, 9:02 AM

## 2019-01-07 NOTE — Patient Care Conference (Signed)
Inpatient RehabilitationTeam Conference and Plan of Care Update Date: 01/07/2019   Time: 2:20 PM    Patient Name: [name]      Medical Record Number: [id number]  Date of Birth: 1951/06/06 Sex: Male         Room/Bed: 4M03C/4M03C-01 Payor Info: Payor: [employer] / Plan: [place] PPO / Product Type: *No Product type* /    Admitting Diagnosis: 5. Gen Team  Debility, sepsis; 13-14days  Admit Date/Time:  12/26/2018  3:39 PM Admission Comments: No comment available   Primary Diagnosis:  <principal problem not specified> Principal Problem: <principal problem not specified>  Patient Active Problem List   Diagnosis Date Noted  . Wound infection   . Chronic pain of left knee   . Orthostatic hypotension   . Pain   . Primary osteoarthritis of right knee   . Effusion of lower leg joint   . Acute pain of left knee   . Moderate episode of recurrent major depressive disorder ([place])   . Labile blood pressure   . Labile blood glucose   . Hypoalbuminemia due to protein-calorie malnutrition ([place])   . Debility 12/26/2018  . Acute on chronic renal failure ([place]) 12/19/2018  . Severe sepsis ([place]) 12/19/2018  . Hyponatremia 12/19/2018  . History of DVT (deep vein thrombosis) 12/19/2018  . Non-healing surgical wound 09/11/2018  . CKD (chronic kidney disease) stage 3, GFR 30-59 ml/min ([facility]) 06/28/2018  . PVD (peripheral vascular disease) ([place]) 06/28/2018  . Diabetic foot ulcer ([place]) 06/27/2018  . Anemia 06/27/2018  . Benign essential HTN   . Diabetes mellitus type 2 in obese ([place])   . Acute blood loss anemia   . Tachypnea   . Leukocytosis   . Critical limb ischemia with history of revascularization of same extremity 04/28/2018  . Critical lower limb ischemia 04/28/2018  . Claudication in peripheral vascular disease ([place]) 08/04/2017  . IDDM (insulin dependent diabetes mellitus) ([place]) 12/02/2012  . HTN (hypertension) 12/02/2012    Expected Discharge Date: Expected  Discharge Date: 01/14/19  Team Members Present: Physician leading conference: Dr. Delice Lesch Social Worker Present: [name] Kin, LCSW Nurse Present: Judee Clara, LPN PT Present: [name], PT OT Present: [name], OT;[name], COTA SLP Present: [name], SLP PPS Coordinator present : [name], SLP     Current Status/Progress Goal Weekly Team Focus  Medical   Functional and mobility deficits secondary to PAD, sepsis, chronic non-healing wounds LLE requiring wound washout.  Improve mobility, endurance, wounds, pain, orthostasis, DM  See above   Bowel/Bladder   Continent of bowel/bladder, LBM 01/05/19  [place] continent  Assess QS toileting needs   Swallow/Nutrition/ Hydration             ADL's   bathing sit<>stand at sink-min A; LB dressing sit<>stand at sink-min A; toilet tranfsers to drop arm BSC-CGA; toileting-supervision; sit<>stand-min A/CGA  supervision overall  sit<>stand, functional transfers, BADL training, activity tolerance, educaiton   Mobility   supervision bed mobility, CGA stand pivot transfer, CGA gait x 50'  supervision bed mobility and basic transfers, min assist car transfer, min assist gait x 25', independent w/c x 150'  LLE flexibility and strengthening, mobility, locomotion, family ed   Communication             Safety/Cognition/ Behavioral Observations            Pain             Skin                *  See Care Plan and progress notes for long and short-term goals.     Barriers to Discharge  Current Status/Progress Possible Resolutions Date Resolved   Physician    Medical stability;Wound Care;Weight     See above  Therapies, optimize DM management, optimize pain meds, follow vitals, wound care      Nursing  Decreased caregiver support               PT                    OT                  SLP                SW                Discharge Planning/Teaching Needs:  Wife was here Monday to see pt in therapies and  was pleased with his motivated and pushing himself. She wants to come back for education next week. Will work on discharge needs.      Team Discussion:  Progressing toward his goals of supervision. Currently min assist with transfers. Right knee pain better. Started back on xarelto and plavix will check blood work. Activity toleranceis better. Ambulating 25 ft with mod assist working toward min assist level. Will need wife to come back in for education prior to DC next Wed.  Revisions to Treatment Plan:  DC 7/22    Continued Need for Acute Rehabilitation Level of Care: The patient requires daily medical management by a physician with specialized training in physical medicine and rehabilitation for the following conditions: Daily direction of a multidisciplinary physical rehabilitation program to ensure safe treatment while eliciting the highest outcome that is of practical value to the patient.: Yes Daily medical management of patient stability for increased activity during participation in an intensive rehabilitation regime.: Yes Daily analysis of laboratory values and/or radiology reports with any subsequent need for medication adjustment of medical intervention for : Wound care problems;Diabetes problems;Other;Post surgical problems;Blood pressure problems   I attest that I was present, lead the team conference, and concur with the assessment and plan of the team. Teleconference held due to COVID 19   [name], [name] 01/08/2019, 8:39 AM

## 2019-01-07 NOTE — Progress Notes (Signed)
[facility] PHYSICAL MEDICINE & REHABILITATION PROGRESS NOTE  Subjective/Complaints: Patient seen sitting up in bed this morning.  He states he slept well overnight.  Discussed plans for wound care with patient.  ROS: Denies CP, shortness of breath, nausea, vomiting, diarrhea.  Objective: Vital Signs: Blood pressure 124/87, pulse 68, temperature 98 F (36.7 C), temperature source Oral, resp. rate 18, weight 101.5 kg, SpO2 99 %. No results found. Recent Labs    01/05/19 0344  WBC 8.6  HGB 9.5*  HCT 29.5*  PLT 140*   Recent Labs    01/05/19 0344  NA 134*  K 4.0  CL 103  CO2 22  GLUCOSE 128*  BUN 27*  CREATININE 0.93  CALCIUM 7.9*    Physical Exam: BP 124/87 (BP Location: Left Arm)   Pulse 68   Temp 98 F (36.7 C) (Oral)   Resp 18   Wt 101.5 kg   SpO2 99%   BMI 29.52 kg/m  Constitutional: No distress . Vital signs reviewed. HENT: Normocephalic.  Atraumatic. Eyes: EOMI.  No discharge. Cardiovascular: No JVD. Respiratory: Normal effort. GI: Non-distended. Musc: Left foot with digit mutations 2-5 Left lower extremity with edema and minimal tenderness Neurological: He is alert and oriented Motor:  Right lower extremity: 4+/5 proximal distal, stable Left lower extremity: Hip flexion 3+/5, knee extension 3+/5, ankle dorsiflexion 3+/5 (some pain inhibition), stable Skin: Left lower extremity with dressing C/D/I (please see PA note for pictures).  No erythema around it. psychiatric: Normal mood.  Normal behavior.  Assessment/Plan: 1. Functional deficits secondary to debility which require 3+ hours per day of interdisciplinary therapy in a comprehensive inpatient rehab setting.  Physiatrist is providing close team supervision and 24 hour management of active medical problems listed below.  Physiatrist and rehab team continue to assess barriers to discharge/monitor patient progress toward functional and medical goals  Care Tool:  Bathing    Body parts bathed by  patient: Right arm, Left arm, Chest, Abdomen, Front perineal area, Buttocks, Right upper leg, Left upper leg, Right lower leg, Face   Body parts bathed by helper: Buttocks, Right lower leg, Left lower leg Body parts n/a: Left lower leg   Bathing assist Assist Level: Minimal Assistance - Patient > 75%     Upper Body Dressing/Undressing Upper body dressing   What is the patient wearing?: Pull over shirt    Upper body assist Assist Level: Supervision/Verbal cueing    Lower Body Dressing/Undressing Lower body dressing      What is the patient wearing?: Pants     Lower body assist Assist for lower body dressing: Minimal Assistance - Patient > 75%     Toileting Toileting    Toileting assist Assist for toileting: Minimal Assistance - Patient > 75%     Transfers Chair/bed transfer  Transfers assist     Chair/bed transfer assist level: Minimal Assistance - Patient > 75%     Locomotion Ambulation   Ambulation assist   Ambulation activity did not occur: Safety/medical concerns  Assist level: Contact Guard/Touching assist Assistive device: Walker-rolling Max distance: 50'   Walk 10 feet activity   Assist  Walk 10 feet activity did not occur: Safety/medical concerns  Assist level: Contact Guard/Touching assist Assistive device: Walker-rolling   Walk 50 feet activity   Assist Walk 50 feet with 2 turns activity did not occur: Safety/medical concerns  Assist level: Contact Guard/Touching assist Assistive device: Walker-rolling    Walk 150 feet activity   Assist Walk 150 feet activity did  not occur: Safety/medical concerns         Walk 10 feet on uneven surface  activity   Assist Walk 10 feet on uneven surfaces activity did not occur: Safety/medical concerns         Wheelchair     Assist Will patient use wheelchair at discharge?: Yes Type of Wheelchair: Manual    Wheelchair assist level: Set up assist, Supervision/Verbal cueing Max  wheelchair distance: 100'    Wheelchair 50 feet with 2 turns activity    Assist        Assist Level: Set up assist, Supervision/Verbal cueing   Wheelchair 150 feet activity     Assist     Assist Level: Supervision/Verbal cueing      Medical Problem List and Plan: 1.Functional and mobility deficitssecondary to PAD, sepsis, chronic non-healing wounds LLE requiring wound washout. Pt significantly debilitated  Continue CIR  Team conference today to discuss current and goals and coordination of care, home and environmental barriers, and discharge planning with nursing, case manager, and therapies.  2. Antithrombotics: -h/o DVT/anticoagulation:Pharmaceutical:Xarelto resumed -antiplatelet therapy: Plavix resumed. 3. Pain Management:Continue Lyrica at bedtime 4. Mood:LCSW to follow for evaluation and support -antipsychotic agents: N/A 5. Neuropsych:this patient iscapable of making decisions onhisown behalf.  Continue to provide positive encouragement 6. Skin/Wound Care:Wet-to-dry dressings dailyto LLE.  Wounds now with tracking-discussed with PA, will pack with iodoform gauze. 7. Fluids/Electrolytes/Nutrition:Monitor I/Os.  Nutritional supplements for wound healing. 8.Strep B wound culture: Completed course of IV Ancef 9.T2DM: Monitor blood sugars AC at bedtime  NovoLog 11 units 3 times a day started on 7/10  Lantus 30 units daily at bedtime  Labile on 7/15  Monitor with increased mobility 10 Iron deficiency: Continue to monitor with serial checks.             -iron supplement            Hemoglobin 9.5 on 7/13, repeat labs ordered for tomorrow given resumption of Plavix and Xarelto later this week  Continue to monitor 11.Constipation: On miralax.  12.  Hyponatremia  Sodium 134 on 7/13  Continue to monitor 13.  Hypoalbuminemia  Supplement initiated on 7/4 14.  Right knee OA  Ice for left knee pain, PRAFO was ordered to  help with positioning-kickstand needs to be out  Knee x-ray personally reviewed and reviewed with patient.  Attempted to review films from last month, however technical limitations.  Discussed with PA, discussed with radiology-no significant changes and osteopenia  Voltaren gel started on 7/9  Continue ice  WBCs reviewed again, within normal limits and afebrile today  Attempted to aspirate on 7/10, however patient exquisitely tender even to palpation when trying to identify bony landmarks; was not able to tolerate.  Patient states this is a chronic problem that has been going on for years is exacerbated for a few days every few months, during which he takes NSAIDs ambulates, which resolves the pain.  He states he never followed up with anyone, despite his wife's recommendations, due to the fact that it self resolved.  Improved 15.  Orthostasis  Negative orthostatics on 7/15  Abdominal binder as necessary  LOS: 12 days A FACE TO FACE EVALUATION WAS PERFORMED  Ankit Lorie Phenix 01/07/2019, 8:34 AM

## 2019-01-08 ENCOUNTER — Inpatient Hospital Stay (HOSPITAL_COMMUNITY): Payer: BC Managed Care – PPO

## 2019-01-08 ENCOUNTER — Inpatient Hospital Stay (HOSPITAL_COMMUNITY): Payer: BC Managed Care – PPO | Admitting: Physical Therapy

## 2019-01-08 ENCOUNTER — Inpatient Hospital Stay (HOSPITAL_COMMUNITY): Payer: BC Managed Care – PPO | Admitting: Occupational Therapy

## 2019-01-08 DIAGNOSIS — R799 Abnormal finding of blood chemistry, unspecified: Secondary | ICD-10-CM

## 2019-01-08 DIAGNOSIS — D75839 Thrombocytosis, unspecified: Secondary | ICD-10-CM

## 2019-01-08 DIAGNOSIS — D473 Essential (hemorrhagic) thrombocythemia: Secondary | ICD-10-CM

## 2019-01-08 LAB — CBC WITH DIFFERENTIAL/PLATELET
Abs Immature Granulocytes: 0.08 10*3/uL — ABNORMAL HIGH (ref 0.00–0.07)
Basophils Absolute: 0 10*3/uL (ref 0.0–0.1)
Basophils Relative: 0 %
Eosinophils Absolute: 0.2 10*3/uL (ref 0.0–0.5)
Eosinophils Relative: 4 %
HCT: 28.4 % — ABNORMAL LOW (ref 39.0–52.0)
Hemoglobin: 8.7 g/dL — ABNORMAL LOW (ref 13.0–17.0)
Immature Granulocytes: 2 %
Lymphocytes Relative: 25 %
Lymphs Abs: 1.2 10*3/uL (ref 0.7–4.0)
MCH: 25.3 pg — ABNORMAL LOW (ref 26.0–34.0)
MCHC: 30.6 g/dL (ref 30.0–36.0)
MCV: 82.6 fL (ref 80.0–100.0)
Monocytes Absolute: 0.6 10*3/uL (ref 0.1–1.0)
Monocytes Relative: 12 %
Neutro Abs: 2.8 10*3/uL (ref 1.7–7.7)
Neutrophils Relative %: 57 %
Platelets: 416 10*3/uL — ABNORMAL HIGH (ref 150–400)
RBC: 3.44 MIL/uL — ABNORMAL LOW (ref 4.22–5.81)
RDW: 20.7 % — ABNORMAL HIGH (ref 11.5–15.5)
WBC: 4.9 10*3/uL (ref 4.0–10.5)
nRBC: 0 % (ref 0.0–0.2)

## 2019-01-08 LAB — GLUCOSE, CAPILLARY
Glucose-Capillary: 100 mg/dL — ABNORMAL HIGH (ref 70–99)
Glucose-Capillary: 57 mg/dL — ABNORMAL LOW (ref 70–99)
Glucose-Capillary: 167 mg/dL — ABNORMAL HIGH (ref 70–99)
Glucose-Capillary: 157 mg/dL — ABNORMAL HIGH (ref 70–99)

## 2019-01-08 NOTE — Progress Notes (Signed)
Physical Therapy Session Note  Patient Details  Name: [name] MRN: [id number] Date of Birth: 1951/03/26  Today's Date: 01/08/2019 PT Individual Time: 0950-1050 PT Individual Time Calculation (min): 60 min   Short Term Goals: Week 2:  PT Short Term Goal 1 (Week 2): Patient will perform all bed mobility with supervision. PT Short Term Goal 2 (Week 2): Patient will demonstrate partial to full weight bearing on L LE in standing as tolerated. PT Short Term Goal 3 (Week 2): Patient will perform sit<>stand with RW with mod A. PT Short Term Goal 4 (Week 2): Patient will ambulate 20 feet with LRAD with min A.  Skilled Therapeutic Interventions/Progress Updates:     Patient in w/c upon PT arrival. Patient alert and agreeable to PT session. Reported 2/10 L knee soreness today and feeling improvement in pain today. PT provided rest breaks, repositioning, and distraction throughout session for pain interventions.   Therapeutic Activity: Transfers: Patient performed sit to/from stand x6 with supervision for safety. Provided verbal cues for weight bearing on L LE.  Gait Training:  Patient ambulated 75 feet using RW with supervision for safety. Ambulated with step-to and step-through gait pattern with decreased gait speed, decreased step length on R, decreased stance time on L, increased knee flexion in stance on L, forward trunk lean, and downward head gaze. Provided verbal cues for looking ahead, shoulder depression, and increased weight shift to the L in stance as tolerated.  Wheelchair Mobility:  Patient propelled wheelchair 150 feet x2 and 50 feet x1 with mod I. Provided new arm rest for reduced drag on R wheel.   Neuromuscular Re-ed: Patient performed standing balance without UE support x3 for 15-50 seconds. He then progressed to holding a basket ball, as he attempted to shoot the ball he had a posterior LOB that required him to sit down on the mat table.  He performed standing balance  reaching L to grab horseshoes from the mat table and placing them on a basketball goal, took a seated rest break then returned them from the goal back to the mat table and repeated putting horseshoes up and back again prior to taking another seated rest break. Provided cues for weight shift onto the L leg.  Therapeutic Exercise: Patient performed the following exercises with verbal and tactile cues for proper technique. -seated L hamstring stretch with heel on 3" step 2x2 min with cues to relax the back of the leg and straighten the knee -L seated LAQ x10 with emphasis on full knee extension and stretching in flexion, knee flexion to grossly 95-100 degrees after  Assessed patients L foot at beginning and end of session, with no visible signs of areas of increased pressure. Educated patient on performing foot checks daily for signs of wounds, abrasions, or pressure sores due to decreased sensation.    Patient in w/c with L LE elevated on ELR with 2 ice packs for pain and edema control at end of session with breaks locked, seat belt alarm set, and all needs within reach.    Therapy Documentation Precautions:  Precautions Precautions: Fall Restrictions Weight Bearing Restrictions: No    Therapy/Group: Individual Therapy  Liyah Higham L Maybell Misenheimer PT, DPT  01/08/2019, 5:10 PM

## 2019-01-08 NOTE — Progress Notes (Signed)
Pt had a blood sugar of 57 at 1200. Pt was given juice and graham crackers and peanut butter. Patient showed no adverse signs or symptoms and pt stated he felt ok. Blood sugar is being rechecked at 1220 and was 63. Patient is sitting in chair eating lunch. Will continue to monitor. PA Pam Love informed.

## 2019-01-08 NOTE — Progress Notes (Signed)
Social Work Patient ID: [name], male   DOB: 08-18-1950, 68 y.o.   MRN: [id number] Spoke with wife via telephone and met with pt to discuss team conference progress toward his goals of supervision and still targeting discharge of 7/22. Have scheduled for wife to come back in Tuesdat 7/21 at 11:00 for more therapy education along with nursing education for pt's wounds and dressings. Discussed hospital bed and wheelchair he will need at home, will check with therapy and will place order for [employer] to contact wife to set up delivery of the bed, prior to pt's discharge. Will continue to work on discharge needs.

## 2019-01-08 NOTE — Progress Notes (Signed)
Social Work Patient ID: [name], male   DOB: 02-03-1951, 68 y.o.   MRN: [id number]    Diagnosis code:N17.9, I50.3U882 & I99.8  Height: 6'1  Weight:223 lbs   Patient has lower leg wounds which requires his legs to be positioned in ways not feasible with a normal bed.  Head must be elevated at 30 degrees,for his cirrculation.  His lower legs require frequent and immediate changes in body position which cannot be achieved with a normal bed.

## 2019-01-08 NOTE — Progress Notes (Signed)
Physical Therapy Session Note  Patient Details  Name: [name] MRN: [id number] Date of Birth: [birth year]  Today's Date: 01/08/2019 PT Individual Time: 0800-0855 PT Individual Time Calculation (min): 55 min   Short Term Goals: Week 2:  PT Short Term Goal 1 (Week 2): Patient will perform all bed mobility with supervision. PT Short Term Goal 2 (Week 2): Patient will demonstrate partial to full weight bearing on L LE in standing as tolerated. PT Short Term Goal 3 (Week 2): Patient will perform sit<>stand with RW with mod A. PT Short Term Goal 4 (Week 2): Patient will ambulate 20 feet with LRAD with min A.  Skilled Therapeutic Interventions/Progress Updates:   Pt in supine and agreeable to therapy, requesting to toilet. Lateral scoot transfer to [facility] w/ supervision, supervision for LE garment management via lateral leans and for pericare. Lateral scoot to w/c. Min assist to don L sock and shoe, supervision for R sock and shoe. Pt self-propelled w/c 100' towards therapy gym w/ BUEs to work on UE strengthening and endurance, total assist from therapist remainder of way. NuStep 5 min @ level 3, 5 min @ level 5 for global strengthening/endurance and for LLE gentle ROM and stretching. Pt w/ LLE pain at end ranges at first, resolves w/ repetitive motion and warm-up. Intermittent bouts of LEs only, encouraged pt to use more of LEs vs UEs for LE strengthening and for LLE weight-bearing. Returned to room, total assist via w/c, and ended session in w/c, all needs in reach. Ice applied to L knee for swelling and relief of soreness/stiffness prior to next PT appointment.   Therapy Documentation Precautions:  Precautions Precautions: Fall Restrictions Weight Bearing Restrictions: No Vital Signs: Therapy Vitals Temp: 98.3 F (36.8 C) Temp Source: Oral Pulse Rate: 65 BP: 118/74 Patient Position (if appropriate): Lying Oxygen Therapy SpO2: 98 % O2 Device: Room Air Pain: Pain Assessment Pain  Score: 0-No pain  Therapy/Group: Individual Therapy  Tyra Michelle K Javionna Leder 01/08/2019, 8:59 AM

## 2019-01-08 NOTE — Progress Notes (Signed)
[facility] PHYSICAL MEDICINE & REHABILITATION PROGRESS NOTE  Subjective/Complaints: Patient seen sitting up in a chair working with therapies this morning.  He states he slept well overnight.  He notes improvement in strength.  Discussed medications and labs with patient.  ROS: Denies CP, shortness of breath, nausea, vomiting, diarrhea.  Objective: Vital Signs: Blood pressure 118/74, pulse 65, temperature 98.3 F (36.8 C), temperature source Oral, resp. rate 18, weight 101.5 kg, SpO2 98 %. No results found. Recent Labs    01/08/19 0753  WBC 4.9  HGB 8.7*  HCT 28.4*  PLT 416*   No results for input(s): NA, K, CL, CO2, GLUCOSE, BUN, CREATININE, CALCIUM in the last 72 hours.  Physical Exam: BP 118/74 (BP Location: Left Arm)   Pulse 65   Temp 98.3 F (36.8 C) (Oral)   Resp 18   Wt 101.5 kg   SpO2 98%   BMI 29.52 kg/m  Constitutional: No distress . Vital signs reviewed. HENT: Normocephalic.  Atraumatic. Eyes: EOMI.  No discharge. Cardiovascular: No JVD. Respiratory: Normal effort. GI: Non-distended. Musc: Left foot with digit mutations 2-5 Left lower extremity with edema and tenderness, improved Neurological: He is alert and oriented Motor:  Right lower extremity: 4+/5 proximal distal, unchanged Left lower extremity: Hip flexion 3+/5, knee extension 3+/5, ankle dorsiflexion 3+/5 (some pain inhibition), unchanged Skin: Left lower extremity with dressing C/D/I (please see PA note for pictures).  No erythema around it. Psych: Normal mood.  Normal behavior.  Assessment/Plan: 1. Functional deficits secondary to debility which require 3+ hours per day of interdisciplinary therapy in a comprehensive inpatient rehab setting.  Physiatrist is providing close team supervision and 24 hour management of active medical problems listed below.  Physiatrist and rehab team continue to assess barriers to discharge/monitor patient progress toward functional and medical goals  Care  Tool:  Bathing    Body parts bathed by patient: Right arm, Left arm, Chest, Abdomen, Front perineal area, Buttocks, Right upper leg, Left upper leg, Right lower leg, Face   Body parts bathed by helper: Buttocks, Right lower leg, Left lower leg Body parts n/a: Left lower leg   Bathing assist Assist Level: Minimal Assistance - Patient > 75%     Upper Body Dressing/Undressing Upper body dressing   What is the patient wearing?: Pull over shirt    Upper body assist Assist Level: Supervision/Verbal cueing    Lower Body Dressing/Undressing Lower body dressing      What is the patient wearing?: Pants     Lower body assist Assist for lower body dressing: Minimal Assistance - Patient > 75%     Toileting Toileting    Toileting assist Assist for toileting: Minimal Assistance - Patient > 75%     Transfers Chair/bed transfer  Transfers assist     Chair/bed transfer assist level: Supervision/Verbal cueing     Locomotion Ambulation   Ambulation assist   Ambulation activity did not occur: Safety/medical concerns  Assist level: Supervision/Verbal cueing Assistive device: Walker-rolling Max distance: 4ft   Walk 10 feet activity   Assist  Walk 10 feet activity did not occur: Safety/medical concerns  Assist level: Supervision/Verbal cueing Assistive device: Walker-rolling   Walk 50 feet activity   Assist Walk 50 feet with 2 turns activity did not occur: Safety/medical concerns  Assist level: Contact Guard/Touching assist Assistive device: Walker-rolling    Walk 150 feet activity   Assist Walk 150 feet activity did not occur: Safety/medical concerns         Walk  10 feet on uneven surface  activity   Assist Walk 10 feet on uneven surfaces activity did not occur: Safety/medical concerns         Wheelchair     Assist Will patient use wheelchair at discharge?: Yes Type of Wheelchair: Manual    Wheelchair assist level: Supervision/Verbal  cueing Max wheelchair distance: 100'    Wheelchair 50 feet with 2 turns activity    Assist        Assist Level: Supervision/Verbal cueing   Wheelchair 150 feet activity     Assist     Assist Level: Supervision/Verbal cueing      Medical Problem List and Plan: 1.Functional and mobility deficitssecondary to PAD, sepsis, chronic non-healing wounds LLE requiring wound washout. Pt significantly debilitated  Continue CIR 2. Antithrombotics: -h/o DVT/anticoagulation:Pharmaceutical:Xarelto resumed -antiplatelet therapy: Plavix resumed. 3. Pain Management:Continue Lyrica at bedtime 4. Mood:LCSW to follow for evaluation and support -antipsychotic agents: N/A 5. Neuropsych:this patient iscapable of making decisions onhisown behalf.  Continue to provide positive encouragement 6. Skin/Wound Care:Wet-to-dry dressings dailyto LLE.  Wounds now with tracking-discussed with PA, packed with iodoform gauze 7. Fluids/Electrolytes/Nutrition:Monitor I/Os.  Nutritional supplements for wound healing. 8.Strep B wound culture: Completed course of IV Ancef 9.T2DM: Monitor blood sugars AC at bedtime  NovoLog 11 units 3 times a day started on 7/10  Lantus 30 units daily at bedtime  Labile on 7/16  Monitor with increased mobility 10 Iron deficiency: Continue to monitor with serial checks.             -iron supplement            Hemoglobin 8.7 on 7/16, labs ordered for tomorrow  Continue to monitor 11.Constipation: On miralax.  12.  Hyponatremia  Sodium 134 on 7/13, labs ordered for tomorrow  Continue to monitor 13.  Hypoalbuminemia  Supplement initiated on 7/4 14.  Right knee OA  Ice for left knee pain, PRAFO was ordered to help with positioning-kickstand needs to be out  Knee x-ray personally reviewed and reviewed with patient.  Attempted to review films from last month, however technical limitations.  Discussed with PA, discussed with  radiology-no significant changes and osteopenia  Voltaren gel started on 7/9  Continue ice  WBCs reviewed again, within normal limits and afebrile today  Attempted to aspirate on 7/10, however patient exquisitely tender even to palpation when trying to identify bony landmarks; was not able to tolerate.  Patient states this is a chronic problem that has been going on for years is exacerbated for a few days every few months, during which he takes NSAIDs ambulates, which resolves the pain.  He states he never followed up with anyone, despite his wife's recommendations, due to the fact that it self resolved.  Improved 15.  Orthostasis  Negative orthostatics on 7/16  Abdominal binder as necessary 16.  Elevated BUN  Labs ordered for tomorrow 17.  Thrombocytosis  Platelets 416 on 7/16, labs ordered for tomorrow    LOS: 13 days A FACE TO [place] 01/08/2019, 8:56 AM

## 2019-01-08 NOTE — Progress Notes (Signed)
Social Work Patient ID: [name], male   DOB: [birth year], 68 y.o.   MRN: [id number]      Diagnosis codes:n17.9, S75.6D254 & I99.8  Height:  6'1              Weight:   223 lbs         Patient suffers from  Sepsis and wounds on lower leg   which impairs his ability to perform daily activities like ADL's and tolieting   in the home.  A walker  will not resolve issue with performing activities of daily living.  A wheelchair will allow patient to safely perform daily activities.  Patient is not able to propel themselves in the home using a standard weight wheelchair due to fatigue and endurance .  Patient can self propel in the lightweight wheelchair.

## 2019-01-08 NOTE — Progress Notes (Signed)
Occupational Therapy Session Note  Patient Details  Name: [name] MRN: [id number] Date of Birth: 1951/06/06  Today's Date: 01/08/2019 OT Individual Time: 1400-1500 OT Individual Time Calculation (min): 60 min    Short Term Goals: Week 2:  OT Short Term Goal 1 (Week 2): Pt will sit to stand with consistent mod A in prep for clothing managment OT Short Term Goal 2 (Week 2): Pt will sit to stand with consistent mod A in prep for clothing managment OT Short Term Goal 3 (Week 2): Pt will complete LB dressing tasks with mod A OT Short Term Goal 4 (Week 2): Pt will completes toileting tasks with CGA  Skilled Therapeutic Interventions/Progress Updates:    Pt seen for OT session focusing on functional mobility and UE strengthening. Pt sitting up in w/c upon arrival, RN present providing pain meds. Pt reports increased fatigue and unsure what he would be able to do for tx but willing to attempt.  He self propelled w/c throughout unit with supervision, rest breaks required throughout.  Discussed home bathroom set-up and shower transfer. Pt reports RW will not fit into bathroom. Was going to attempt walking along rail in hallway in simulation of using bathroom counter to ambulate. However, upon attempt deemed unsafe as pt requires B UE support in order to weightshift and advance LE and returned to chair safely with assist. Then attempted to have pt side step with use of railing, however, cont to be unsafe as pt unable to lead side-step with L LE. Pt reports too fatigued to safely attempt any further walking or standing and requesting to do UE exercises. He transitioned to supine on mat. Completed x2 sets of 12 of the following exercises using #5 dowel rod, VCs/ demonstration for proper form and technique.   Chest press  Overhead press  Bicep curl Pt returned to sitting EOM, requiring increased time as complaints of vertigo like symptoms upon mobility. With increased time and VCs for gaze  stabilization techniques, pt able to come sitting EOM and completed stand pivot transfer to w/c with guarding assist using RW. Pt returned to room at end of session. Transitioned to supine in bed and left with all needs in reach and bed alarm on.   Therapy Documentation Precautions:  Precautions Precautions: Fall Restrictions Weight Bearing Restrictions: No   Therapy/Group: Individual Therapy  [name] L 01/08/2019, 7:13 AM

## 2019-01-09 ENCOUNTER — Inpatient Hospital Stay (HOSPITAL_COMMUNITY): Payer: BC Managed Care – PPO | Admitting: Occupational Therapy

## 2019-01-09 ENCOUNTER — Ambulatory Visit: Payer: BC Managed Care – PPO | Admitting: Family

## 2019-01-09 ENCOUNTER — Inpatient Hospital Stay (HOSPITAL_COMMUNITY): Payer: BC Managed Care – PPO

## 2019-01-09 ENCOUNTER — Encounter (HOSPITAL_COMMUNITY): Payer: BC Managed Care – PPO

## 2019-01-09 ENCOUNTER — Other Ambulatory Visit (HOSPITAL_COMMUNITY): Payer: BC Managed Care – PPO

## 2019-01-09 DIAGNOSIS — Z794 Long term (current) use of insulin: Secondary | ICD-10-CM

## 2019-01-09 DIAGNOSIS — E1165 Type 2 diabetes mellitus with hyperglycemia: Secondary | ICD-10-CM

## 2019-01-09 LAB — GLUCOSE, CAPILLARY
Glucose-Capillary: 117 mg/dL — ABNORMAL HIGH (ref 70–99)
Glucose-Capillary: 185 mg/dL — ABNORMAL HIGH (ref 70–99)
Glucose-Capillary: 140 mg/dL — ABNORMAL HIGH (ref 70–99)
Glucose-Capillary: 142 mg/dL — ABNORMAL HIGH (ref 70–99)
Glucose-Capillary: 106 mg/dL — ABNORMAL HIGH (ref 70–99)

## 2019-01-09 LAB — CBC WITH DIFFERENTIAL/PLATELET
Abs Immature Granulocytes: 0.08 10*3/uL — ABNORMAL HIGH (ref 0.00–0.07)
Basophils Absolute: 0 10*3/uL (ref 0.0–0.1)
Basophils Relative: 0 %
Eosinophils Absolute: 0.2 10*3/uL (ref 0.0–0.5)
Eosinophils Relative: 4 %
HCT: 28.6 % — ABNORMAL LOW (ref 39.0–52.0)
Hemoglobin: 8.9 g/dL — ABNORMAL LOW (ref 13.0–17.0)
Immature Granulocytes: 2 %
Lymphocytes Relative: 26 %
Lymphs Abs: 1.2 10*3/uL (ref 0.7–4.0)
MCH: 25.8 pg — ABNORMAL LOW (ref 26.0–34.0)
MCHC: 31.1 g/dL (ref 30.0–36.0)
MCV: 82.9 fL (ref 80.0–100.0)
Monocytes Absolute: 0.6 10*3/uL (ref 0.1–1.0)
Monocytes Relative: 13 %
Neutro Abs: 2.6 10*3/uL (ref 1.7–7.7)
Neutrophils Relative %: 55 %
Platelets: 435 10*3/uL — ABNORMAL HIGH (ref 150–400)
RBC: 3.45 MIL/uL — ABNORMAL LOW (ref 4.22–5.81)
RDW: 20.6 % — ABNORMAL HIGH (ref 11.5–15.5)
WBC: 4.8 10*3/uL (ref 4.0–10.5)
nRBC: 0 % (ref 0.0–0.2)

## 2019-01-09 LAB — BASIC METABOLIC PANEL
Anion gap: 8 (ref 5–15)
BUN: 21 mg/dL (ref 8–23)
CO2: 25 mmol/L (ref 22–32)
Calcium: 9.1 mg/dL (ref 8.9–10.3)
Chloride: 102 mmol/L (ref 98–111)
Creatinine, Ser: 0.93 mg/dL (ref 0.61–1.24)
GFR calc Af Amer: >60 mL/min (ref >60)
GFR calc non Af Amer: >60 mL/min (ref >60)
Glucose, Bld: 141 mg/dL — ABNORMAL HIGH (ref 70–99)
Potassium: 4.2 mmol/L (ref 3.5–5.1)
Sodium: 135 mmol/L (ref 135–145)

## 2019-01-09 NOTE — Plan of Care (Signed)
  Problem: RH Balance Goal: LTG Patient will maintain dynamic standing balance (PT) Description: LTG:  Patient will maintain dynamic standing balance with assistance during mobility activities (PT) Flowsheets (Taken 01/09/2019 0743) LTG: Pt will maintain dynamic standing balance during mobility activities with:: (upgraded goal) Supervision/Verbal cueing Note: Upgraded goal due to patients progress with therapies, increased independence with daily activity, and for reduced caregiver burden at d/c.   Problem: Sit to Stand Goal: LTG:  Patient will perform sit to stand with assistance level (PT) Description: LTG:  Patient will perform sit to stand with assistance level (PT) Flowsheets (Taken 01/09/2019 0743) LTG: PT will perform sit to stand in preparation for functional mobility with assistance level: (upgraded goal) Independent with assistive device Note: Upgraded goal due to patients progress with therapies, increased independence with daily activity, and for reduced caregiver burden at d/c.   Problem: RH Bed Mobility Goal: LTG Patient will perform bed mobility with assist (PT) Description: LTG: Patient will perform bed mobility with assistance, with/without cues (PT). Flowsheets (Taken 01/09/2019 0743) LTG: Pt will perform bed mobility with assistance level of: (upgraded goal) Independent with assistive device  Note: Upgraded goal due to patients progress with therapies, increased independence with daily activity, and for reduced caregiver burden at d/c.   Problem: RH Bed to Chair Transfers Goal: LTG Patient will perform bed/chair transfers w/assist (PT) Description: LTG: Patient will perform bed to chair transfers with assistance (PT). Flowsheets (Taken 01/09/2019 0743) LTG: Pt will perform Bed to Chair Transfers with assistance level: (upgraded goal) Independent with assistive device  Note: Upgraded goal due to patients progress with therapies, increased independence with daily activity, and  for reduced caregiver burden at d/c.   Problem: RH Car Transfers Goal: LTG Patient will perform car transfers with assist (PT) Description: LTG: Patient will perform car transfers with assistance (PT). 01/09/2019 0748 by Apolinar Junes L, PT Flowsheets (Taken 01/09/2019 0743) LTG: Pt will perform car transfers with assist:: (upgraded goal) Supervision/Verbal cueing Note: Upgraded goal due to patients progress with therapies, increased independence with daily activity, and for reduced caregiver burden at d/c. 01/09/2019 0743 by Apolinar Junes L, PT Flowsheets (Taken 01/09/2019 0743) LTG: Pt will perform car transfers with assist:: (upgraded goal) Supervision/Verbal cueing   Problem: RH Ambulation Goal: LTG Patient will ambulate in controlled environment (PT) Description: LTG: Patient will ambulate in a controlled environment, # of feet with assistance (PT). Flowsheets (Taken 01/09/2019 0743) LTG: Pt will ambulate in controlled environ  assist needed:: (upgraded goal) Supervision/Verbal cueing LTG: Ambulation distance in controlled environment: 100' Note: Upgraded goal due to patients progress with therapies, increased independence with daily activity, and for reduced caregiver burden at d/c.

## 2019-01-09 NOTE — Progress Notes (Signed)
Social Work Discharge Note   The overall goal for the admission was met for:   Discharge location: Yes-HOME WITH WIFE WHO CAN ASSIST 24 HR  Length of Stay: Yes-19 DAYS  Discharge activity level: Yes-SUPERVISION-SOME MIN ASSIST   Home/community participation: Yes  Services provided included: MD, RD, PT, OT, RN, CM, Pharmacy, Neuropsych and SW  Financial Services: Medicare and Private Insurance: [employer]  Follow-up services arranged: Home Health: [facility]-PT,OT,RN, DME: [facility], [place] and Patient/Family request agency HH: [facility], DME: NO PREF  Comments (or additional information):WIFE WAS IN ON TOW DIFFERENT [place] FEELS PT IS DOING WELL AND SHE CAN MANAGE HIS CARE AT HOME  Patient/Family verbalized understanding of follow-up arrangements: Yes  Individual responsible for coordination of the follow-up plan: SELF & MADERIA-WIFE  Confirmed correct DME delivered: Elease Hashimoto 01/09/2019    Elease Hashimoto

## 2019-01-09 NOTE — Progress Notes (Signed)
Physical Therapy Weekly Progress Note  Patient Details  Name: [name] MRN: [id number] Date of Birth: [birth year]  Beginning of progress report period: January 02, 2019 End of progress report period: January 09, 2019  Today's Date: 01/09/2019 PT Individual Time: 9518-8416 and 6063-0160 PT Individual Time Calculation (min): 60 min and 58 min   Patient has met 4 of 4 short term goals.  Currently requires supervision for bed mobility, all transfers, w/c mobility, and gait with RW up to 75 feet. Demonstrates improvements in L LE pain and knee ROM, knee flexion now >90 degrees in sitting.   Patient continues to demonstrate the following deficits muscle weakness and muscle joint tightness, decreased cardiorespiratoy endurance and decreased standing balance, decreased postural control and decreased balance strategies and therefore will continue to benefit from skilled PT intervention to increase functional independence with mobility.  Patient progressing toward long term goals..  Plan of care revisions: upgraded long term goals today due to patient's progress with therapies. .  PT Short Term Goals Week 2:  PT Short Term Goal 1 (Week 2): Patient will perform all bed mobility with supervision. PT Short Term Goal 1 - Progress (Week 2): Met PT Short Term Goal 2 (Week 2): Patient will demonstrate partial to full weight bearing on L LE in standing as tolerated. PT Short Term Goal 2 - Progress (Week 2): Met PT Short Term Goal 3 (Week 2): Patient will perform sit<>stand with RW with mod A. PT Short Term Goal 3 - Progress (Week 2): Met PT Short Term Goal 4 (Week 2): Patient will ambulate 20 feet with LRAD with min A. PT Short Term Goal 4 - Progress (Week 2): Met Week 3:  PT Short Term Goal 1 (Week 3): STG=LTG due to ELOS.  Skilled Therapeutic Interventions/Progress Updates:     Session 1: Patient in bed asleep upon PT arrival. Patient easily aroused and agreeable to PT session. Reported 2/10 soreness in  L knee at beginning of session, PT provided repositioning, distraction, and rest breaks for pain intervention throughout session.   Therapeutic Activity: Bed Mobility: Patient performed supine to/from sit with supervision using hospital bed funtions.  Transfers: Patient performed sit to/from stand x2 with bed slightly elevated  stand pivot transfer, and a toilet transfer with close supervision. Provided verbal cues for setting L foot flat on the floor for increased weight bearing. Required supervision for peri-care and LB dressing during toileting. Patient reported mild dizziness x2 with transfers, assessed orthostatic BP: sitting 127/89 HR 80; standing 122/89 HR 95 with brief, mild dizziness after standing. RN made aware about intermittent dizziness at end of session.   Gait Training:  Patient ambulated 15 feet x2 and 80 feet x1 using RW with close supervision for safety. Ambulated with increased L knee flexion in stand, decreased stance time on L, and forefoot contact at initial contact. Provided verbal cues for increased weight bearing, knee extension in stance and heel strike at initial contact on L.  Wheelchair Mobility:  Patient propelled wheelchair 50 feet with supervision and increased time, limited distance due to R shoulder pain/soreness form pushing the w/c with increased resistance for several days previously. Patient was independent with donning/doffing leg rest and use of breaks throughout session.   Therapeutic Exercise: Patient performed the following exercises with verbal and tactile cues for proper technique. -NuStep for L LE strength, ROM, and generalized endurance on level 6 resistance seat as far back as it can go x10 min, HR 79 prior to activity and  99 at end of activity. Attempted to bring seat forward at 6 min for increased knee flexion, but patient was unable to tolerate this position and returned to previous setting.   Patient in w/c at end of session with breaks locked and  all needs within reach. D/c seat belt alarm this session, safety plan updated, educated patient on not getting up by himself or performing transfers from bed/w/c alone and to call for help. Patient agreed and stated he would not get up by himself.   Session 2: Patient in w/c upon PT arrival. Patient alert and agreeable to PT session. Patient denied pain at beginning of session.   Therapeutic Activity: Transfers: Patient performed sit to/from stand x3 using the RW and x2 using B rails at the steps with supervision-CGA. Provided verbal cues for for hand placement on rails at steps for improved leverage for standing.  Gait Training:  Patient ambulated 101 feet using RW with supervision, see gait details above. Patient requested to attempt stair training this session and able to teach back correct technique for step-to gait leading with his R going up and L going down from previous education with therapy at a SNF. Attempted placing R foot on 6" step and patient had increased difficulty with full weight bearing to place foot at top of step. Provided patient brief rest break and attempted going up 3" steps with the same technique with success up/down 8 3" steps with CGA-min A for support. Required a standing rest break at top of steps before coming down. Patient reported that at his home, their flight of stairs only has a rail on the L and asked about techniques he could use to go up/down his steps at home. PT demonstrated side stepping technique leading with R going up and L coming down. Patient performed 1 3" step using this technique before requesting to sit down due to L knee pain/fatigue.   Wheelchair Mobility:  Patient propelled wheelchair 50 feet with mod I, limited distance due to R shoulder fatigue/soreness.  Patient was independent with donning/doffing leg rest and use of breaks throughout session.   Patient in w/c at end of session with breaks locked and all needs within reach. Reported 7/10 L  knee pain at end of session, RN made aware and provided pain medication, PT provided ice packs for pain intervention and placed then on his L knee and patient repositioned his L LE onto the ELR.    Therapy Documentation Precautions:  Precautions Precautions: Fall Restrictions Weight Bearing Restrictions: No   Therapy/Group: Individual Therapy  Angelyne Terwilliger L Janyia Guion PT, DPT  01/09/2019, 1:56 PM

## 2019-01-09 NOTE — Progress Notes (Signed)
[name] PHYSICAL MEDICINE & REHABILITATION PROGRESS NOTE  Subjective/Complaints: Patient seen sitting up in bed this morning.  He states he slept well overnight.  He denies complaints.  ROS: Denies CP, shortness of breath, nausea, vomiting, diarrhea.  Objective: Vital Signs: Blood pressure (P) 136/86, pulse (P) 61, temperature (P) 98 F (36.7 C), temperature source (P) Oral, resp. rate (P) 18, weight 101.5 kg, SpO2 (P) 99 %. No results found. Recent Labs    01/08/19 0753 01/09/19 0622  WBC 4.9 4.8  HGB 8.7* 8.9*  HCT 28.4* 28.6*  PLT 416* 435*   Recent Labs    01/09/19 0622  NA 135  K 4.2  CL 102  CO2 25  GLUCOSE 141*  BUN 21  CREATININE 0.93  CALCIUM 9.1    Physical Exam: BP (P) 136/86 (BP Location: Left Arm)   Pulse (P) 61   Temp (P) 98 F (36.7 C) (Oral)   Resp (P) 18   Wt 101.5 kg   SpO2 (P) 99%   BMI 29.52 kg/m  Constitutional: No distress . Vital signs reviewed. HENT: Normocephalic.  Atraumatic. Eyes: EOMI.  No discharge. Cardiovascular: No JVD. Respiratory: Normal effort. GI: Non-distended. Musc: Left foot with digit mutations 2-5 Left lower extremity with edema and tenderness, continues to improve Neurological: He is alert and oriented Motor:  Right lower extremity: 4+/5 proximal distal, stable Left lower extremity: Hip flexion 3+/5, knee extension 3+/5, ankle dorsiflexion 3+/5 (some pain inhibition), stable Skin: Left lower extremity with dressing C/D/I (please see PA note for pictures).  No erythema around it, unchanged. Psych: Normal mood.  Normal behavior.  Assessment/Plan: 1. Functional deficits secondary to debility which require 3+ hours per day of interdisciplinary therapy in a comprehensive inpatient rehab setting.  Physiatrist is providing close team supervision and 24 hour management of active medical problems listed below.  Physiatrist and rehab team continue to assess barriers to discharge/monitor patient progress toward  functional and medical goals  Care Tool:  Bathing    Body parts bathed by patient: Right arm, Left arm, Chest, Abdomen, Front perineal area, Buttocks, Right upper leg, Left upper leg, Right lower leg, Face   Body parts bathed by helper: Buttocks, Right lower leg, Left lower leg Body parts n/a: Left lower leg   Bathing assist Assist Level: Minimal Assistance - Patient > 75%     Upper Body Dressing/Undressing Upper body dressing   What is the patient wearing?: Pull over shirt    Upper body assist Assist Level: Supervision/Verbal cueing    Lower Body Dressing/Undressing Lower body dressing      What is the patient wearing?: Pants     Lower body assist Assist for lower body dressing: Minimal Assistance - Patient > 75%     Toileting Toileting    Toileting assist Assist for toileting: Minimal Assistance - Patient > 75%     Transfers Chair/bed transfer  Transfers assist     Chair/bed transfer assist level: Supervision/Verbal cueing Chair/bed transfer assistive device: Programmer, multimedia   Ambulation assist   Ambulation activity did not occur: Safety/medical concerns  Assist level: Supervision/Verbal cueing Assistive device: Walker-rolling Max distance: 75'   Walk 10 feet activity   Assist  Walk 10 feet activity did not occur: Safety/medical concerns  Assist level: Supervision/Verbal cueing Assistive device: Walker-rolling   Walk 50 feet activity   Assist Walk 50 feet with 2 turns activity did not occur: Safety/medical concerns  Assist level: Supervision/Verbal cueing Assistive device: Walker-rolling  Walk 150 feet activity   Assist Walk 150 feet activity did not occur: Safety/medical concerns         Walk 10 feet on uneven surface  activity   Assist Walk 10 feet on uneven surfaces activity did not occur: Safety/medical concerns         Wheelchair     Assist Will patient use wheelchair at discharge?: Yes Type of  Wheelchair: Manual    Wheelchair assist level: Independent Max wheelchair distance: 150'    Wheelchair 50 feet with 2 turns activity    Assist        Assist Level: Independent   Wheelchair 150 feet activity     Assist     Assist Level: Independent      Medical Problem List and Plan: 1.Functional and mobility deficitssecondary to PAD, sepsis, chronic non-healing wounds LLE requiring wound washout. Pt significantly debilitated  Continue CIR 2. Antithrombotics: -h/o DVT/anticoagulation:Pharmaceutical:Xarelto resumed -antiplatelet therapy: Plavix resumed. 3. Pain Management:Continue Lyrica at bedtime 4. Mood:LCSW to follow for evaluation and support -antipsychotic agents: N/A 5. Neuropsych:this patient iscapable of making decisions onhisown behalf.  Continue to provide positive encouragement 6. Skin/Wound Care:Wet-to-dry dressings dailyto LLE.  Wounds now with tracking-discussed with PA, packed with iodoform gauze, continue 7. Fluids/Electrolytes/Nutrition:Monitor I/Os.  Nutritional supplements for wound healing. 8.Strep B wound culture: Completed course of IV Ancef 9.T2DM: Monitor blood sugars AC at bedtime  NovoLog 11 units 3 times a day started on 7/10  Lantus 30 units daily at bedtime  Labile on 7/17, monitor for trend  Monitor with increased mobility 10 Iron deficiency: Continue to monitor with serial checks.             -iron supplement            Hemoglobin 8.9 on 7/17  Continue to monitor 11.Constipation: On miralax.  12.  Hyponatremia  Sodium 135 on 7/17  Continue to monitor 13.  Hypoalbuminemia  Supplement initiated on 7/4 14.  Right knee OA  Ice for left knee pain, PRAFO was ordered to help with positioning-kickstand needs to be out  Knee x-ray personally reviewed and reviewed with patient.  Attempted to review films from last month, however technical limitations.  Discussed with PA, discussed with  radiology-no significant changes and osteopenia  Voltaren gel started on 7/9  Continue ice  WBCs reviewed again, within normal limits and afebrile today  Attempted to aspirate on 7/10, however patient exquisitely tender even to palpation when trying to identify bony landmarks; was not able to tolerate.  Patient states this is a chronic problem that has been going on for years is exacerbated for a few days every few months, during which he takes NSAIDs ambulates, which resolves the pain.  He states he never followed up with anyone, despite his wife's recommendations, due to the fact that it self resolved.  Improved 15.  Orthostasis  Negative orthostatics on 7/17  Abdominal binder as necessary 16.  Thrombocytosis  Platelets 435 on 7/17   LOS: 14 days A FACE TO FACE EVALUATION WAS PERFORMED  Issabelle Mcraney Lorie Phenix 01/09/2019, 8:48 AM

## 2019-01-09 NOTE — Progress Notes (Signed)
Occupational Therapy Weekly Progress Note  Patient Details  Name: [name] MRN: [id number] Date of Birth: [birth year]  Beginning of progress report period: January 02, 2019 End of progress report period: January 09, 2019  Today's Date: 01/09/2019 OT Individual Time: 1045-1200 OT Individual Time Calculation (min): 75 min    Patient has met 4 of 4 short term goals.  Pt is now able to complete mobility (sit to stand, short ambulation) with CGA,  Toilet with CGA, and LB dressing with min A. He continues to need A with L sock and shoe due to edema (AE too tight) and he anticipated his wife will assist him with that task.  He is on track to reaching a S level (except for L sock/shoe)   Patient continues to demonstrate the following deficits: muscle weakness, decreased cardiorespiratoy endurance and decreased standing balance and decreased balance strategies and therefore will continue to benefit from skilled OT intervention to enhance overall performance with BADL.  Patient progressing toward long term goals..  Continue POC, except LTG of dressing with S with be changed to min A due to A needed with shoe and sock as pt has too much edema to use sock aid and to be able to tie his shoes.   OT Short Term Goals Week 1:  OT Short Term Goal 1 (Week 1): Pt will sit to stand with consistent mod A in prep for clothing managment OT Short Term Goal 1 - Progress (Week 1): Progressing toward goal OT Short Term Goal 2 (Week 1): Pt will complete toilet transfer wiht S and LRAD via lateral scoot to decrease burden of care OT Short Term Goal 2 - Progress (Week 1): Progressing toward goal OT Short Term Goal 3 (Week 1): Pt will thread BLE into pants wiht AE PRN and min A OT Short Term Goal 3 - Progress (Week 1): Met OT Short Term Goal 4 (Week 1): Pt will don 1/2 socks wiht AE PRN and min VC for AE instruction OT Short Term Goal 4 - Progress (Week 1): Met Week 2:  OT Short Term Goal 1 (Week 2): Pt will sit to stand  with consistent mod A in prep for clothing managment OT Short Term Goal 1 - Progress (Week 2): Met OT Short Term Goal 2 (Week 2): Pt will sit to stand with consistent mod A in prep for clothing managment OT Short Term Goal 2 - Progress (Week 2): Met OT Short Term Goal 3 (Week 2): Pt will complete LB dressing tasks with mod A OT Short Term Goal 3 - Progress (Week 2): Met OT Short Term Goal 4 (Week 2): Pt will completes toileting tasks with CGA OT Short Term Goal 4 - Progress (Week 2): Met Week 3:  OT Short Term Goal 1 (Week 3): STGs = LTGs  Skilled Therapeutic Interventions/Progress Updates:    Pt seen this session to focus on standing and endurance. Pt provided a report of how he has been doing with his self care. Pt stood at sink for several minutes to complete oral care and grooming, ambulated in the hallway standing to converse with another therapist.   He then worked on UE strengthening using 3 # dowel and resistance band.  Demonstrated with repeat demonstration of lateral neck stretches, BUT it induced vertigo.  So pt may try that stretch again tomorrow with his eyes closed.  Pt resting in wc with all needs met.   Therapy Documentation Precautions:  Precautions Precautions: Fall Restrictions Weight Bearing Restrictions:  No    Vital Signs: Therapy Vitals Temp: (P) 98 F (36.7 C) Temp Source: (P) Oral Pulse Rate: (P) 61 Resp: (P) 18 BP: (P) 136/86 Patient Position (if appropriate): (P) Lying Oxygen Therapy SpO2: (P) 99 % O2 Device: (P) Room Air Pain: Pain Assessment Pain Score: (P) 0-No pain   Therapy/Group: Individual Therapy  Sargeant 01/09/2019, 9:13 AM

## 2019-01-09 NOTE — Plan of Care (Signed)
  Problem: RH Dressing Goal: LTG Patient will perform lower body dressing w/assist (OT) Description: LTG: Patient will perform lower body dressing with assist, with/without cues in positioning using equipment (OT) Flowsheets (Taken 01/09/2019 1309) LTG: Pt will perform lower body dressing with assistance level of: (LTG of dressing with S is changed to min A due to A needed with shoe and sock as pt has too much edema to use sock aid and to be able to tie his shoes.) Minimal Assistance - Patient > 75% Note:  LTG of dressing with S is changed to min A due to A needed with shoe and sock as pt has too much edema to use sock aid and to be able to tie his shoes.

## 2019-01-10 ENCOUNTER — Inpatient Hospital Stay (HOSPITAL_COMMUNITY): Payer: BC Managed Care – PPO

## 2019-01-10 DIAGNOSIS — G5732 Lesion of lateral popliteal nerve, left lower limb: Secondary | ICD-10-CM

## 2019-01-10 LAB — GLUCOSE, CAPILLARY
Glucose-Capillary: 134 mg/dL — ABNORMAL HIGH (ref 70–99)
Glucose-Capillary: 104 mg/dL — ABNORMAL HIGH (ref 70–99)
Glucose-Capillary: 258 mg/dL — ABNORMAL HIGH (ref 70–99)
Glucose-Capillary: 134 mg/dL — ABNORMAL HIGH (ref 70–99)

## 2019-01-10 NOTE — Progress Notes (Signed)
[place] PHYSICAL MEDICINE & REHABILITATION PROGRESS NOTE  Subjective/Complaints: C/o outrigger on L PRAFO does not prevent foot from flopping out  ROS: Denies CP, shortness of breath, nausea, vomiting, diarrhea.  Objective: Vital Signs: Blood pressure (!) 141/94, pulse 66, temperature 98.2 F (36.8 C), temperature source Oral, resp. rate 18, weight 99 kg, SpO2 99 %. No results found. Recent Labs    01/08/19 0753 01/09/19 0622  WBC 4.9 4.8  HGB 8.7* 8.9*  HCT 28.4* 28.6*  PLT 416* 435*   Recent Labs    01/09/19 0622  NA 135  K 4.2  CL 102  CO2 25  GLUCOSE 141*  BUN 21  CREATININE 0.93  CALCIUM 9.1    Physical Exam: BP (!) 141/94 (BP Location: Left Arm)   Pulse 66   Temp 98.2 F (36.8 C) (Oral)   Resp 18   Wt 99 kg   SpO2 99%   BMI 28.80 kg/m  Constitutional: No distress . Vital signs reviewed. HENT: Normocephalic.  Atraumatic. Eyes: EOMI.  No discharge. Cardiovascular: No JVD. Respiratory: Normal effort. GI: Non-distended. Musc: Left foot with digit mutations 2-5 Left lower extremity with edema and tenderness, continues to improve Neurological: He is alert and oriented Motor:  Right lower extremity: 4+/5 proximal distal, stable Left lower extremity: Hip flexion 3+/5, knee extension 3+/5, ankle dorsiflexion 3+/5 (some pain inhibition), stable Weak BLE hip int rotators Skin: Left lower extremity with dressing C/D/I (please see PA note for pictures).  No erythema around it, unchanged. Psych: Normal mood.  Normal behavior.  Assessment/Plan: 1. Functional deficits secondary to debility which require 3+ hours per day of interdisciplinary therapy in a comprehensive inpatient rehab setting.  Physiatrist is providing close team supervision and 24 hour management of active medical problems listed below.  Physiatrist and rehab team continue to assess barriers to discharge/monitor patient progress toward functional and medical goals  Care Tool:  Bathing     Body parts bathed by patient: Right arm, Left arm, Chest, Abdomen, Front perineal area, Buttocks, Right upper leg, Left upper leg, Right lower leg, Face   Body parts bathed by helper: Buttocks, Right lower leg, Left lower leg Body parts n/a: Left lower leg   Bathing assist Assist Level: Minimal Assistance - Patient > 75%     Upper Body Dressing/Undressing Upper body dressing   What is the patient wearing?: Pull over shirt    Upper body assist Assist Level: Supervision/Verbal cueing    Lower Body Dressing/Undressing Lower body dressing      What is the patient wearing?: Pants     Lower body assist Assist for lower body dressing: Minimal Assistance - Patient > 75%     Toileting Toileting    Toileting assist Assist for toileting: Minimal Assistance - Patient > 75%     Transfers Chair/bed transfer  Transfers assist     Chair/bed transfer assist level: Supervision/Verbal cueing Chair/bed transfer assistive device: Programmer, multimedia   Ambulation assist   Ambulation activity did not occur: Safety/medical concerns  Assist level: Supervision/Verbal cueing Assistive device: Walker-rolling Max distance: 101'   Walk 10 feet activity   Assist  Walk 10 feet activity did not occur: Safety/medical concerns  Assist level: Supervision/Verbal cueing Assistive device: Walker-rolling   Walk 50 feet activity   Assist Walk 50 feet with 2 turns activity did not occur: Safety/medical concerns  Assist level: Supervision/Verbal cueing Assistive device: Walker-rolling    Walk 150 feet activity   Assist Walk 150 feet activity  did not occur: Safety/medical concerns         Walk 10 feet on uneven surface  activity   Assist Walk 10 feet on uneven surfaces activity did not occur: Safety/medical concerns         Wheelchair     Assist Will patient use wheelchair at discharge?: Yes Type of Wheelchair: Manual    Wheelchair assist level:  Independent Max wheelchair distance: 41'    Wheelchair 50 feet with 2 turns activity    Assist        Assist Level: Independent   Wheelchair 150 feet activity     Assist     Assist Level: Independent      Medical Problem List and Plan: 1.Functional and mobility deficitssecondary to PAD, sepsis, chronic non-healing wounds LLE requiring wound washout. Pt significantly debilitated  Continue CIR PT, OT 2. Antithrombotics: -h/o DVT/anticoagulation:Pharmaceutical:Xarelto resumed -antiplatelet therapy: Plavix resumed. 3. Pain Management:Continue Lyrica at bedtime 4. Mood:LCSW to follow for evaluation and support -antipsychotic agents: N/A 5. Neuropsych:this patient iscapable of making decisions onhisown behalf.  Continue to provide positive encouragement 6. Skin/Wound Care:Wet-to-dry dressings dailyto LLE.  Wounds now with tracking-discussed with PA, packed with iodoform gauze, continue 7. Fluids/Electrolytes/Nutrition:Monitor I/Os.  Nutritional supplements for wound healing. 8.Strep B wound culture: Completed course of IV Ancef 9.T2DM: Monitor blood sugars AC at bedtime, numbness Left foot neuropathy, no right foot numbness   NovoLog 11 units 3 times a day started on 7/10  Lantus 30 units daily at bedtime  Labile on 7/17, monitor for trend  Monitor with increased mobility 10 Iron deficiency: Continue to monitor with serial checks.             -iron supplement            Hemoglobin 8.9 on 7/17  Continue to monitor 11.Constipation: On miralax.  12.  Hyponatremia  Sodium 135 on 7/17  Continue to monitor 13.  Hypoalbuminemia  Supplement initiated on 7/4 14.  Right knee OA  Ice for left knee pain, PRAFO was ordered to help with positioning-kickstand needs to be out    Voltaren gel started on 7/9  Continue ice  WBCs reviewed again, within normal limits and afebrile today     Improved 15.  Orthostasis improved    Negative orthostatics on 7/17  Abdominal binder as necessary 16.  Thrombocytosis  Platelets 435 on 7/17   LOS: 15 days A FACE TO [place] E  01/10/2019, 7:21 AM

## 2019-01-10 NOTE — Progress Notes (Signed)
Physical Therapy Session Note  Patient Details  Name: [name] MRN: [id number] Date of Birth: 26-Jun-1950  Today's Date: 01/10/2019 PT Individual Time: 0900-1000 PT Individual Time Calculation (min): 60 min   Short Term Goals: Week 3:  PT Short Term Goal 1 (Week 3): STG=LTG due to ELOS.  Skilled Therapeutic Interventions/Progress Updates:     Patient in bed upon PT arrival. Patient alert and agreeable to PT session.   Therapeutic Activity: Bed Mobility: Patient performed supine to long sitting and supine to/from sit with independently, cleared patient to sit EOB independently in the room. Educated patient on not standing without assistance and calling for help for transfers. Patient in agreement and RN made aware.  Patient donned B socks in long sitting in the bed and R shoe sitting EOB with supervision, PT donned L shoe with total A. Transfers: Patient performed sit to/from stand x7 and a toilet transfer to [facility] over the toilet x1 with supervision for safety using a RW. Required supervision for peri-care and LB dressing.   Gait Training:  Patient ambulated 153 feet using RW with supervision. Ambulated with step-to gait pattern leading with L, antalgic gait, increased L knee flexion in stance, decreased stance time on L, and forefoot contact at initial contact. Provided verbal cues for increased weight bearing, knee extension in stance and heel strike at initial contact on L.  Neuromuscular Re-ed: Patient performed dynamic standing balance with supervision using the RW with 1-2 UE support picking up horse shoes from the mat table on the L and placing then on the basketball goal offset to the L causing him to reach outside of BOS and weight shift to the L LE, then return them back to the mat table after a sitting rest break.  Patient in bed at end of session with breaks locked, bed alarm set, and all needs within reach.    Therapy Documentation Precautions:  Precautions Precautions:  Fall Restrictions Weight Bearing Restrictions: No Pain: Pain Assessment Pain Scale: 0-10 Pain Score: 0-No pain    Therapy/Group: Individual Therapy  Sawsan Riggio L Pollyanna Levay PT, DPT  01/10/2019, 1:01 PM

## 2019-01-11 ENCOUNTER — Inpatient Hospital Stay (HOSPITAL_COMMUNITY): Payer: BC Managed Care – PPO

## 2019-01-11 LAB — GLUCOSE, CAPILLARY
Glucose-Capillary: 132 mg/dL — ABNORMAL HIGH (ref 70–99)
Glucose-Capillary: 141 mg/dL — ABNORMAL HIGH (ref 70–99)
Glucose-Capillary: 72 mg/dL (ref 70–99)
Glucose-Capillary: 144 mg/dL — ABNORMAL HIGH (ref 70–99)

## 2019-01-11 NOTE — Progress Notes (Deleted)
Sutures removed abdomen and left thigh per order. Patient tolerated sutures intact.

## 2019-01-11 NOTE — Progress Notes (Addendum)
Physical Therapy Session Note  Patient Details  Name: [name] MRN: [id number] Date of Birth: 16-Mar-1951  Today's Date: 01/11/2019 PT Individual Time: 1120-1205 PT Individual Time Calculation (min): 45 min   Short Term Goals:  Week 3:  PT Short Term Goal 1 (Week 3): STG=LTG due to ELOS.  Skilled Therapeutic Interventions/Progress Updates:   Pt resting in w/c. He reported that he had doffed non slip socks and donned bil socks and R shoe before PT entered.  PT assisted pt with donning L shoe.  W/c propulsion using bil UEs indepednent on unit.  Lateral scoot transfer to NuStep.   neuromuscular re-education via forced use, multimodal cues :at level 4 wihtout use of UEs, x 6 minutes for LLE flexibility.  At level 6 x 2 minutes wihtout use of UEs, for power. At stairs with bil UE support on railings, LLE step/taps focusing on initial contact with heel, x 15.    Pt does not have step to enter basement.  There is a 2 inch high threshold (just a transitional strip) which he managed with RW PTA.  With simulation, pt navigated this type of threshold with RW, close supervision, with no cues needed for technique.  Gait training with supervision on level tile x 90' , RW.  Cues for L heel initial contact, with good demonstration noted.    At end of session, pt resting in w/c with needs at hand, with LLE elevated on ELR.  Pt to call for ice packs.     Therapy Documentation Precautions:  Precautions Precautions: Fall Restrictions Weight Bearing Restrictions: No  Pain: Pain Assessment Pain Scale: 0-10 Pain Score: 6 Pain Type: Acute pain Pain Location: Leg Pain Orientation: Left Pain Descriptors / Indicators: Aching Pain Frequency: Intermittent Pain Onset: On-going Pain Intervention(s): Medication (See eMAR)      Therapy/Group: Individual Therapy  Druanne Bosques 01/11/2019, 12:17 PM

## 2019-01-11 NOTE — Progress Notes (Signed)
[place] PHYSICAL MEDICINE & REHABILITATION PROGRESS NOTE  Subjective/Complaints:  No issues overnite question sabout LLE weakness discussed neuropathy, PVD and OA of Left knee ROS: Denies CP, shortness of breath, nausea, vomiting, diarrhea.  Objective: Vital Signs: Blood pressure (!) 134/98, pulse 67, temperature 97.7 F (36.5 C), temperature source Oral, resp. rate 16, weight 98.5 kg, SpO2 100 %. No results found. Recent Labs    01/08/19 0753 01/09/19 0622  WBC 4.9 4.8  HGB 8.7* 8.9*  HCT 28.4* 28.6*  PLT 416* 435*   Recent Labs    01/09/19 0622  NA 135  K 4.2  CL 102  CO2 25  GLUCOSE 141*  BUN 21  CREATININE 0.93  CALCIUM 9.1    Physical Exam: BP (!) 134/98   Pulse 67   Temp 97.7 F (36.5 C) (Oral)   Resp 16   Wt 98.5 kg   SpO2 100%   BMI 28.65 kg/m  Constitutional: No distress . Vital signs reviewed. HENT: Normocephalic.  Atraumatic. Eyes: EOMI.  No discharge. Cardiovascular: No JVD. Respiratory: Normal effort. GI: Non-distended. Musc: Left foot with digit mutations 2-5 Left lower extremity with edema and tenderness, continues to improve Neurological: He is alert and oriented Motor:  Right lower extremity: 4+/5 proximal distal, stable Left lower extremity: Hip flexion 3+/5, knee extension 3+/5, ankle dorsiflexion 3+/5 (some pain inhibition), stable Weak BLE hip int rotators Skin: Left lower extremity with dressing C/D/I (please see PA note for pictures).  No erythema around it, unchanged. Psych: Normal mood.  Normal behavior.  Assessment/Plan: 1. Functional deficits secondary to debility which require 3+ hours per day of interdisciplinary therapy in a comprehensive inpatient rehab setting.  Physiatrist is providing close team supervision and 24 hour management of active medical problems listed below.  Physiatrist and rehab team continue to assess barriers to discharge/monitor patient progress toward functional and medical goals  Care  Tool:  Bathing    Body parts bathed by patient: Right arm, Left arm, Chest, Abdomen, Front perineal area, Buttocks, Right upper leg, Left upper leg, Right lower leg, Face   Body parts bathed by helper: Buttocks, Right lower leg, Left lower leg Body parts n/a: Left lower leg   Bathing assist Assist Level: Minimal Assistance - Patient > 75%     Upper Body Dressing/Undressing Upper body dressing   What is the patient wearing?: Pull over shirt    Upper body assist Assist Level: Supervision/Verbal cueing    Lower Body Dressing/Undressing Lower body dressing      What is the patient wearing?: Pants     Lower body assist Assist for lower body dressing: Minimal Assistance - Patient > 75%     Toileting Toileting    Toileting assist Assist for toileting: Minimal Assistance - Patient > 75%     Transfers Chair/bed transfer  Transfers assist     Chair/bed transfer assist level: Supervision/Verbal cueing Chair/bed transfer assistive device: Programmer, multimedia   Ambulation assist   Ambulation activity did not occur: Safety/medical concerns  Assist level: Supervision/Verbal cueing Assistive device: Walker-rolling Max distance: 153'   Walk 10 feet activity   Assist  Walk 10 feet activity did not occur: Safety/medical concerns  Assist level: Supervision/Verbal cueing Assistive device: Walker-rolling   Walk 50 feet activity   Assist Walk 50 feet with 2 turns activity did not occur: Safety/medical concerns  Assist level: Supervision/Verbal cueing Assistive device: Walker-rolling    Walk 150 feet activity   Assist Walk 150 feet activity did  not occur: Safety/medical concerns  Assist level: Supervision/Verbal cueing Assistive device: Walker-rolling    Walk 10 feet on uneven surface  activity   Assist Walk 10 feet on uneven surfaces activity did not occur: Safety/medical concerns         Wheelchair     Assist Will patient use  wheelchair at discharge?: Yes Type of Wheelchair: Manual    Wheelchair assist level: Independent Max wheelchair distance: 38'    Wheelchair 50 feet with 2 turns activity    Assist        Assist Level: Independent   Wheelchair 150 feet activity     Assist     Assist Level: Independent      Medical Problem List and Plan: 1.Functional and mobility deficitssecondary to PAD, sepsis, chronic non-healing wounds LLE requiring wound washout. Pt significantly debilitated  Continue CIR PT, OT Long conversation about need for WC vs walker, return to work and timeframe of recovery 2. Antithrombotics: -h/o DVT/anticoagulation:Pharmaceutical:Xarelto resumed -antiplatelet therapy: Plavix resumed. 3. Pain Management:Continue Lyrica at bedtime 4. Mood:LCSW to follow for evaluation and support -antipsychotic agents: N/A 5. Neuropsych:this patient iscapable of making decisions onhisown behalf.  Continue to provide positive encouragement 6. Skin/Wound Care:Wet-to-dry dressings dailyto LLE.  Wounds now with tracking-discussed with PA, packed with iodoform gauze, continue 7. Fluids/Electrolytes/Nutrition:Monitor I/Os.  Nutritional supplements for wound healing. 8.Strep B wound culture: Completed course of IV Ancef 9.T2DM: Monitor blood sugars AC at bedtime, numbness Left foot neuropathy, no right foot numbness   NovoLog 11 units 3 times a day started on 7/10  Lantus 30 units daily at bedtime   CBG (last 3)  Recent Labs    01/10/19 1652 01/10/19 2108 01/11/19 0627  GLUCAP 258* 134* 132*    Monitor with increased mobility 10 Iron deficiency: Continue to monitor with serial checks.             -iron supplement            Hemoglobin 8.9 on 7/17  Continue to monitor 11.Constipation: On miralax.  12.  Hyponatremia  Sodium 135 on 7/17  Continue to monitor 13.  Hypoalbuminemia  Supplement initiated on 7/4 14.  Right knee OA  Ice  for left knee pain, PRAFO was ordered to help with positioning-kickstand needs to be out    Voltaren gel started on 7/9  Continue ice  WBCs reviewed again, within normal limits and afebrile today     Improved 15.  Orthostasis improved   Negative orthostatics on 7/17  Abdominal binder as necessary 16.  Thrombocytosis  Platelets 435 on 7/17   LOS: 16 days A FACE TO [place] E Sayvon Arterberry 01/11/2019, 7:50 AM

## 2019-01-12 ENCOUNTER — Inpatient Hospital Stay (HOSPITAL_COMMUNITY): Payer: BC Managed Care – PPO | Admitting: Occupational Therapy

## 2019-01-12 ENCOUNTER — Inpatient Hospital Stay (HOSPITAL_COMMUNITY): Payer: BC Managed Care – PPO | Admitting: Physical Therapy

## 2019-01-12 ENCOUNTER — Inpatient Hospital Stay (HOSPITAL_COMMUNITY): Payer: BC Managed Care – PPO

## 2019-01-12 LAB — CBC
HCT: 30.7 % — ABNORMAL LOW (ref 39.0–52.0)
Hemoglobin: 9.4 g/dL — ABNORMAL LOW (ref 13.0–17.0)
MCH: 25.6 pg — ABNORMAL LOW (ref 26.0–34.0)
MCHC: 30.6 g/dL (ref 30.0–36.0)
MCV: 83.7 fL (ref 80.0–100.0)
Platelets: 370 10*3/uL (ref 150–400)
RBC: 3.67 MIL/uL — ABNORMAL LOW (ref 4.22–5.81)
RDW: 20.7 % — ABNORMAL HIGH (ref 11.5–15.5)
WBC: 5.6 10*3/uL (ref 4.0–10.5)
nRBC: 0 % (ref 0.0–0.2)

## 2019-01-12 LAB — BASIC METABOLIC PANEL
Anion gap: 11 (ref 5–15)
BUN: 19 mg/dL (ref 8–23)
CO2: 25 mmol/L (ref 22–32)
Calcium: 9.2 mg/dL (ref 8.9–10.3)
Chloride: 102 mmol/L (ref 98–111)
Creatinine, Ser: 0.9 mg/dL (ref 0.61–1.24)
GFR calc Af Amer: >60 mL/min (ref >60)
GFR calc non Af Amer: >60 mL/min (ref >60)
Glucose, Bld: 132 mg/dL — ABNORMAL HIGH (ref 70–99)
Potassium: 3.7 mmol/L (ref 3.5–5.1)
Sodium: 138 mmol/L (ref 135–145)

## 2019-01-12 LAB — GLUCOSE, CAPILLARY
Glucose-Capillary: 94 mg/dL (ref 70–99)
Glucose-Capillary: 161 mg/dL — ABNORMAL HIGH (ref 70–99)
Glucose-Capillary: 91 mg/dL (ref 70–99)
Glucose-Capillary: 167 mg/dL — ABNORMAL HIGH (ref 70–99)

## 2019-01-12 MED ORDER — INSULIN ASPART 100 UNIT/ML ~~LOC~~ SOLN
5.0000 [IU] | Freq: Three times a day (TID) | SUBCUTANEOUS | Status: DC
  Administered 2019-01-12 – 2019-01-14 (×6): 5 [IU] via SUBCUTANEOUS

## 2019-01-12 MED ORDER — AMMONIUM LACTATE 12 % EX LOTN
TOPICAL_LOTION | Freq: Two times a day (BID) | CUTANEOUS | Status: DC
  Administered 2019-01-12 – 2019-01-13 (×4): via TOPICAL
  Filled 2019-01-12: qty 225

## 2019-01-12 NOTE — Progress Notes (Signed)
Physical Therapy Session Note  Patient Details  Name: [name] MRN: [id number] Date of Birth: [birth year]  Today's Date: 01/12/2019 PT Individual Time: 1027-1122 PT Individual Time Calculation (min): 55 min   Short Term Goals: Week 3:  PT Short Term Goal 1 (Week 3): STG=LTG due to ELOS.  Skilled Therapeutic Interventions/Progress Updates:  Pt received sitting on EOB & agreeable to tx, denying c/o pain & requesting to use bike. Pt transfers sit<>Stand with supervision and ambulates room<>dayroom (312 ft x 2) with RW & supervision with B shoulders elevated and progressing to step through>reciprocal gait pattern with BUE reliance to decrease weight bearing in LLE during LLE stance phase. Pt uses nu-step on level 5 x 10 minutes with intermittent use of BUE in addition to BLE with task focusing on global strengthening & endurance training. Pt performed the following standing exercises for BLE strengthening: marches and mini squats (no UE support) with CGA when weight bearing through LLE with pt reporting he's primarily supporting himself with BUE 2/2 LLE weakness. At end of session pt left in bed with alarm set & needs at hand.   Pt declines pain but requests ice for LLE at end of session; without cuing pt is able to verbalize for need to use ice for 20 minutes then remove it for 20 minutes. Educated pt on need to place towel between ice pack & skin at home when he ices LLE.   Therapy Documentation Precautions:  Precautions Precautions: Fall Restrictions Weight Bearing Restrictions: No   Therapy/Group: Individual Therapy  [name] 01/12/2019, 11:22 AM

## 2019-01-12 NOTE — Progress Notes (Addendum)
[place] PHYSICAL MEDICINE & REHABILITATION PROGRESS NOTE  Subjective/Complaints:  He feels that both feet are equally tingly   ROS: Denies CP, shortness of breath, nausea, vomiting, diarrhea.  Objective: Vital Signs: Blood pressure (!) 149/84, pulse 66, temperature 98.3 F (36.8 C), temperature source Oral, resp. rate 18, weight 99.6 kg, SpO2 100 %. No results found. No results for input(s): WBC, HGB, HCT, PLT in the last 72 hours. No results for input(s): NA, K, CL, CO2, GLUCOSE, BUN, CREATININE, CALCIUM in the last 72 hours.  Physical Exam: BP (!) 149/84 (BP Location: Right Arm)   Pulse 66   Temp 98.3 F (36.8 C) (Oral)   Resp 18   Wt 99.6 kg   SpO2 100%   BMI 28.97 kg/m  Constitutional: No distress . Vital signs reviewed. HENT: Normocephalic.  Atraumatic. Eyes: EOMI.  No discharge. Cardiovascular: No JVD. Respiratory: Normal effort. GI: Non-distended. Musc: Left foot with digit mutations 2-5, tight left achilles Left lower extremity with edema and tenderness, continues to improve Neurological: He is alert and oriented Motor:  Right lower extremity: 4+/5 proximal distal, stable Left lower extremity: Hip flexion 3+/5, knee extension 3+/5, ankle dorsiflexion 3+/5 (some pain inhibition), stable Weak BLE hip int rotators No sensation in great toes bilat Skin: Left lower extremity with dressing C/D/I (please see PA note for pictures).  No erythema around it, unchanged. Psych: Normal mood.  Normal behavior.  Assessment/Plan: 1. Functional deficits secondary to debility which require 3+ hours per day of interdisciplinary therapy in a comprehensive inpatient rehab setting.  Physiatrist is providing close team supervision and 24 hour management of active medical problems listed below.  Physiatrist and rehab team continue to assess barriers to discharge/monitor patient progress toward functional and medical goals  Care Tool:  Bathing    Body parts bathed by patient:  Right arm, Left arm, Chest, Abdomen, Front perineal area, Buttocks, Right upper leg, Left upper leg, Right lower leg, Face   Body parts bathed by helper: Buttocks, Right lower leg, Left lower leg Body parts n/a: Left lower leg   Bathing assist Assist Level: Minimal Assistance - Patient > 75%     Upper Body Dressing/Undressing Upper body dressing   What is the patient wearing?: Pull over shirt    Upper body assist Assist Level: Supervision/Verbal cueing    Lower Body Dressing/Undressing Lower body dressing      What is the patient wearing?: Pants     Lower body assist Assist for lower body dressing: Minimal Assistance - Patient > 75%     Toileting Toileting    Toileting assist Assist for toileting: Minimal Assistance - Patient > 75%     Transfers Chair/bed transfer  Transfers assist     Chair/bed transfer assist level: Contact Guard/Touching assist Chair/bed transfer assistive device: Walker, Clinical biochemist   Ambulation assist   Ambulation activity did not occur: Safety/medical concerns  Assist level: Supervision/Verbal cueing Assistive device: Walker-rolling Max distance: 90   Walk 10 feet activity   Assist  Walk 10 feet activity did not occur: Safety/medical concerns  Assist level: Supervision/Verbal cueing Assistive device: Walker-rolling   Walk 50 feet activity   Assist Walk 50 feet with 2 turns activity did not occur: Safety/medical concerns  Assist level: Supervision/Verbal cueing Assistive device: Walker-rolling    Walk 150 feet activity   Assist Walk 150 feet activity did not occur: Safety/medical concerns  Assist level: Supervision/Verbal cueing Assistive device: Walker-rolling    Walk 10 feet on uneven  surface  activity   Assist Walk 10 feet on uneven surfaces activity did not occur: Safety/medical concerns         Wheelchair     Assist Will patient use wheelchair at discharge?: No Type of  Wheelchair: Manual    Wheelchair assist level: Independent Max wheelchair distance: 150    Wheelchair 50 feet with 2 turns activity    Assist        Assist Level: Independent   Wheelchair 150 feet activity     Assist     Assist Level: Independent      Medical Problem List and Plan: 1.Functional and mobility deficitssecondary to PAD, sepsis, chronic non-healing wounds LLE requiring wound washout. Pt significantly debilitated   Continue CIR PT, OT  2. Antithrombotics: -h/o DVT/anticoagulation:Pharmaceutical:Xarelto resumed -antiplatelet therapy: Plavix resumed. 3. Pain Management:Continue Lyrica at bedtime 4. Mood:LCSW to follow for evaluation and support -antipsychotic agents: N/A 5. Neuropsych:this patient iscapable of making decisions onhisown behalf.  Continue to provide positive encouragement 6. Skin/Wound Care:Wet-to-dry dressings dailyto LLE.  Wounds now with tracking-discussed with PA, packed with iodoform gauze, continue Add lac hydrin to left lower leg Take clinical images of wounds prior to D/C Remove midline if labs ok  7. Fluids/Electrolytes/Nutrition:Monitor I/Os.  Nutritional supplements for wound healing. 8.Strep B wound culture: Completed course of IV Ancef 9.T2DM: Monitor blood sugars AC at bedtime, numbness Left foot neuropathy, no right foot numbness   NovoLog 11 units 3 times a day started on 7/10  Lantus 30 units daily at bedtime   CBG (last 3)  Recent Labs    01/11/19 1705 01/11/19 2134 01/12/19 0600  GLUCAP 72 144* 94  Controlled 7/20 Monitor with increased mobility 10 Iron deficiency: Continue to monitor with serial checks.             -iron supplement            Hemoglobin 8.9 on 7/17  Continue to monitor 11.Constipation: On miralax.  12.  Hyponatremia  Sodium 135 on 7/17  Continue to monitor 13.  Hypoalbuminemia  Supplement initiated on 7/4 14.  Right knee OA  Ice for left  knee pain, PRAFO was ordered to help with positioning-kickstand needs to be out    Voltaren gel started on 7/9  Continue ice  WBCs reviewed again, within normal limits and afebrile today     Improved 15.  Orthostasis improved   Negative orthostatics on 7/17  Abdominal binder as necessary 16.  Thrombocytosis  Platelets 435 on 7/17   LOS: 17 days A FACE TO [place] E Kirsteins 01/12/2019, 7:31 AM

## 2019-01-12 NOTE — Plan of Care (Signed)
  Problem: Consults Goal: RH GENERAL PATIENT EDUCATION Description: See Patient Education module for education specifics. Outcome: Progressing   Problem: RH SKIN INTEGRITY Goal: RH STG SKIN FREE OF INFECTION/BREAKDOWN Description: With min assist Outcome: Progressing Goal: RH STG MAINTAIN SKIN INTEGRITY WITH ASSISTANCE Description: STG Maintain Skin Integrity With min Assistance. Outcome: Progressing Goal: RH STG ABLE TO PERFORM INCISION/WOUND CARE W/ASSISTANCE Description: STG Able To Perform Incision/Wound Care With  min Assistance. Outcome: Progressing   Problem: RH SAFETY Goal: RH STG ADHERE TO SAFETY PRECAUTIONS W/ASSISTANCE/DEVICE Description: STG Adhere to Safety Precautions With cues/reminders Outcome: Progressing   Problem: RH PAIN MANAGEMENT Goal: RH STG PAIN MANAGED AT OR BELOW PT'S PAIN GOAL Description: Manage pain at or below level 4 Outcome: Progressing   Problem: RH KNOWLEDGE DEFICIT GENERAL Goal: RH STG INCREASE KNOWLEDGE OF SELF CARE AFTER HOSPITALIZATION Outcome: Progressing

## 2019-01-12 NOTE — Progress Notes (Signed)
Occupational Therapy Session Note  Patient Details  Name: [name] MRN: [id number] Date of Birth: 04/11/1951  Today's Date: 01/12/2019 OT Individual Time: 1300-1411 OT Individual Time Calculation (min): 71 min   Skilled Therapeutic Interventions/Progress Updates:    Pt greeted in bed with no c/o pain. Wanting to complete bathing/dressing. Started with gathering clothing items at ambulatory level using RW. With vcs, pt draped clothing over RW to transport near sink. Before bathing, ambulatory toilet transfer completed using RW at supervision level. Supervision for toileting tasks with pt having continent B+B void. He then transferred to w/c placed at sink. Min vcs for locking w/c brakes prior to doffing shorts via lateral leans. Pt completed bathing with supervision setup using lateral leans for hygiene. He opted not to wash feet. Dressing completed sit<stand using reacher after with supervision. Independent for oral care/grooming tasks w/c level. Discussed writing a book about his life as a therapeutic activity to engage in at home. Pt has a very interesting history of employment and has met several prominent people in the community. At end of session pt opted to remain in his w/c. Left him with all needs within reach.   Therapy Documentation Precautions:  Precautions Precautions: Fall Restrictions Weight Bearing Restrictions: No Vital Signs: Therapy Vitals Temp: 97.7 F (36.5 C) Temp Source: Oral Pulse Rate: 66 Resp: 14 BP: (!) 148/74 Patient Position (if appropriate): Sitting Oxygen Therapy SpO2: 100 % O2 Device: Room Air ADL:     Therapy/Group: Individual Therapy  Demarion Pondexter A Maira Christon 01/12/2019, 4:07 PM

## 2019-01-12 NOTE — Discharge Summary (Signed)
Physician Discharge Summary  Patient ID: [name] MRN: [id number] DOB/AGE: [birth year] 68 y.o.  Admit date: 12/26/2018 Discharge date: 01/14/2019  Discharge Diagnoses:  Principal Problem:   Debility Active Problems:   HTN (hypertension)   Hypoalbuminemia due to protein-calorie malnutrition (HCC)   Moderate episode of recurrent major depressive disorder (HCC)   Primary osteoarthritis of right knee   Effusion of lower leg joint   Chronic pain of left knee   Thrombocytosis (HCC)   Type 2 diabetes mellitus with hyperglycemia, with long-term current use of insulin (HCC)   Discharged Condition: Stable   Significant Diagnostic Studies:  Dg Knee Complete 4 Views Left  Result Date: 12/31/2018 CLINICAL DATA:  LEFT knee pain at patella for 2 years EXAM: LEFT KNEE - COMPLETE 4+ VIEW COMPARISON:  12/19/2018 FINDINGS: Osseous demineralization. Diffuse joint space narrowing. No acute fracture, dislocation, or bone destruction. Knee joint effusion present. Extensive vascular stents. Diffuse soft tissue swelling. IMPRESSION: Degenerative changes and osseous demineralization LEFT knee with associated joint effusion. No acute abnormalities. Electronically Signed   By: Lavonia Dana M.D.   On: 12/31/2018 17:22    Labs:  Basic Metabolic Panel: BMP Latest Ref Rng & Units 01/12/2019 01/09/2019 01/05/2019  Glucose 70 - 99 mg/dL 132(H) 141(H) 128(H)  BUN 8 - 23 mg/dL 19 21 27(H)  Creatinine 0.61 - 1.24 mg/dL 0.90 0.93 0.93  Sodium 135 - 145 mmol/L 138 135 134(L)  Potassium 3.5 - 5.1 mmol/L 3.7 4.2 4.0  Chloride 98 - 111 mmol/L 102 102 103  CO2 22 - 32 mmol/L 25 25 22   Calcium 8.9 - 10.3 mg/dL 9.2 9.1 7.9(L)    CBC: Recent Labs  Lab 01/08/19 0753 01/09/19 0622 01/12/19 0655  WBC 4.9 4.8 5.6  NEUTROABS 2.8 2.6  --   HGB 8.7* 8.9* 9.4*  HCT 28.4* 28.6* 30.7*  MCV 82.6 82.9 83.7  PLT 416* 435* 370    CBG: Recent Labs  Lab 01/13/19 0610 01/13/19 1144 01/13/19 1635 01/13/19 2058  01/14/19 0552  GLUCAP 109* 141* 93 130* 101*    Brief HPI:   [name] is a 68 year old male with history of T2DM with peripheral neuropathy, nonhealing diabetic wounds, DVT, severe PVD who was admitted on 12/19/2018 with bloody purulent drainage from left tibial wounds, marked hypotension and acute blood loss anemia.  He was started on IV antibiotics for sepsis due to foot infection and treated with 1 units packed red blood cells for hemoglobin of 6.7.  Acute on chronic renal failure has improved with fluid resuscitation and he was taken to the OR for washout of left lower extremity wounds on 6/28 and 6/30 by [facility].  Wound culture showed abundant group B strep agalactiae and was maintained on IV antibiotics.  He did have downward trend in H&H to 6.9/21.4 and was transfused with 2 additional units PRBC. Lantus was being titrated upwards for better blood sugar control.  Therapy was ongoing and CIR was recommended due to functional decline.   Hospital Course: [name] was admitted to rehab 12/26/2018 for inpatient therapies to consist of PT and OT at least three hours five days a week. Past admission physiatrist, therapy team and rehab RN have worked together to provide customized collaborative inpatient rehab.  His blood pressures have been monitored on twice daily basis and have been well controlled.  Serial check of be met shows hyponatremia has resolved and renal status is stable.  He was maintained on IV Ancef for 2+ weeks  and this was discontinued per input from BVS.  Dressing changes were done BID  and he was noted to have some tracking of wound site.  Dr. Donzetta Matters was consulted for input on packing wound with iodoform gauze to help promote healing.  He was agreeable to this therefore this has been ongoing with great improvement in wound depth and resolution of tracts.  LLE edema has greatly improved with elevation as well as use of Ace wraps.  Follow-up CBC shows ABLA is resolving and WBC is stable.   He has had issues with high levels of anxiety and team has been providing ego support during his stay.  Dr. Rodenbough/neuropsychologist was consulted for input on mood and felt that patient with depressive symptoms over adjustment disorder due to ongoing issues over the past 6 months.  He was started on Celexa and mood is greatly improved by discharge.  Patient diabetes has been monitored with a [facility] CBG checks with use of Lantus and meal coverage during his stay.  He was advised to resume his [place] 30 and Victoza at discharge.  He is to monitor blood sugars on a [facility] basis and follow-up with PCP for further adjustment in his insulin protocol.  Left knee pain has improved with use of Voltaren gel as well as local measures.  He has been weaned off oxycodone and pain is currently controlled with as needed use of tramadol.  He has made great gains during his rehab stay and is currently at supervision level.  He will continue to receive further follow-up home health PT, OT and RN by [facility] after discharge.    Rehab course: During patient's stay in rehab weekly team conferences were held to monitor patient's progress, set goals and discuss barriers to discharge. At admission, patient required max assist for basic self care tasks and min assist for mobility  He  has had improvement in activity tolerance, balance, postural control as well as ability to compensate for deficits. He has had improvement in functional use E as well as increase in strength and decrease in pain.  He is able to complete ADL tasks with supervision. He is modified independent for transfers and is able to ambulate 200' with use of RW and supervision. Family education as been done regarding all aspects of safety and mobility.     Disposition: Home  Diet:  Carb Modified medium  Special Instructions: 1. Monitor BS ac/hs and record. 2. Cleanse wound with normal saline. Pack track with iodoform gauze. Cove wound bed with damp to dry  dressing. Change dressings bid. 3. Resume [place] 30 and Victoza per home protocol.   Discharge Instructions    Ambulatory referral to Physical Medicine Rehab   Complete by: As directed    1-2 weeks transitional care appt     Allergies as of 01/14/2019   No Known Allergies     Medication List    STOP taking these medications   amoxicillin-clavulanate 875-125 MG tablet Commonly known as: Augmentin   bisacodyl 5 MG EC tablet Commonly known as: DULCOLAX   HYDROcodone-acetaminophen 5-325 MG tablet Commonly known as: Norco   insulin aspart 100 UNIT/ML injection Commonly known as: novoLOG   insulin glargine 100 UNIT/ML injection Commonly known as: LANTUS   losartan 100 MG tablet Commonly known as: COZAAR   metFORMIN 1000 MG tablet Commonly known as: GLUCOPHAGE   pantoprazole 40 MG tablet Commonly known as: PROTONIX     TAKE these medications   acetaminophen 325 MG tablet  Commonly known as: TYLENOL Take 1-2 tablets (325-650 mg total) by mouth every 4 (four) hours as needed for mild pain.   allopurinol 100 MG tablet Commonly known as: ZYLOPRIM Take 100 mg by mouth daily.   citalopram 10 MG tablet Commonly known as: CELEXA Take 1 tablet (10 mg total) by mouth daily. Start taking on: January 15, 2019   clobetasol cream 0.05 % Commonly known as: TEMOVATE Apply 1 application topically 2 (two) times daily as needed (rash).   clopidogrel 75 MG tablet Commonly known as: PLAVIX Take 1 tablet (75 mg total) by mouth daily.   colchicine 0.6 MG tablet Take 0.6 mg by mouth 2 (two) times daily.   diclofenac sodium 1 % Gel Commonly known as: VOLTAREN Apply 2 g topically 4 (four) times daily.   dorzolamide-timolol 22.3-6.8 MG/ML ophthalmic solution Commonly known as: COSOPT Place 1 drop into both eyes 2 (two) times daily.   EQL MEGA SELECT MENS PO Take 1 tablet by mouth daily.   ferrous sulfate 325 (65 FE) MG tablet Take 1 tablet (325 mg total) by mouth 2 (two) times  daily with a meal.   polyethylene glycol 17 g packet Commonly known as: MIRALAX / GLYCOLAX Take 17 g by mouth daily as needed for mild constipation. What changed:   when to take this  reasons to take this   pravastatin 40 MG tablet Commonly known as: PRAVACHOL Take 40 mg by mouth at bedtime.   pregabalin 50 MG capsule Commonly known as: LYRICA Take 50-100 mg by mouth at bedtime.   rivaroxaban 10 MG Tabs tablet Commonly known as: XARELTO Take 1 tablet (10 mg total) by mouth daily with supper. What changed:   medication strength  how much to take  additional instructions   traMADol 50 MG tablet--Rx # 28 pills Commonly known as: ULTRAM Take 1 tablet (50 mg total) by mouth every 6 (six) hours as needed for moderate pain.   Travatan Z 0.004 % Soln ophthalmic solution Generic drug: Travoprost (BAK Free) Place 1 drop into both eyes at bedtime.   V-Go 30 Kit See admin instructions. Uses Novolog insulin in pump   Victoza 18 MG/3ML Sopn Generic drug: liraglutide Inject 0.2 mLs (1.2 mg total) into the skin daily. Inject once daily at the same time What changed: when to take this      Follow-up Information    Jamse Arn, MD Follow up.   Specialty: Physical Medicine and Rehabilitation Why: Office will call you with appointment Contact information: [address] [place] [address] [phone]        Waynetta Sandy, MD. Call.   Specialties: Vascular Surgery, Cardiology Why: for post op appointment Contact information: [place] [address] [phone]        [name], MD. Call in 1 day(s).   Specialty: Internal Medicine Why: for post hospital follow up Contact information: [address] [place] [address] [phone]           Signed: Bary Leriche 01/14/2019, 11:15 PM

## 2019-01-12 NOTE — Progress Notes (Signed)
Midline removed by floor nurse

## 2019-01-12 NOTE — Progress Notes (Signed)
Physical Therapy Session Note  Patient Details  Name: [name] MRN: [id number] Date of Birth: 04/18/1951  Today's Date: 01/12/2019 PT Individual Time: 0801-0913 PT Individual Time Calculation (min): 72 min   Short Term Goals: Week 3:  PT Short Term Goal 1 (Week 3): STG=LTG due to ELOS.  Skilled Therapeutic Interventions/Progress Updates:    Pt seated EOB upon PT arrival, agreeable to therapy tx and reports pain 6/10 in leg, shoulder & neck. Rest breaks and repositioning for pain relief. Pt performed sit<>stand with RW and min assist, ambulated from room>gym x 153 ft with RW and supervision, step to pattern. Pt performed standing hamstring curls 2 x 10 with L LE, 2 x 10 seated LAQ with L LE, and x 10 mini squats without UE support, all working on strength and ROM, cues for techniques. Pt ambulated 2 x 20 ft from mat<>steps with RW and supervision, pt ascended/descended 6 steps (3 inch) this session with B rails and CGA with cues for techniques, step to pattern. Pt ambulated from mat<>steps 2 x 20 ft with RW and supervision, ascended/descended 1 steps (6 inch) x 2 this session with B rails and min assist, step to pattern with cues for techniques. Pt worked on standing balance without UE support this session while tossing horseshoes, x 2 trials with cues for increased L lateral weightshift and L LE weightbearing. Pt worked on standing balance without UE support while performing ball toss activity against rebounder trampoline, x 2 trials with CGA. Stand pivot to the w/c with RW and supervision. Pt propelled w/c to the room x 150 ft with supervision using B UEs. PT ambulated from w/c<>toilet this session 2 x 10 ft with RW and supervision, continent of bowel and bladder, performed pericare and clothing management without assist. Pt left seated EOB at end of session with needs in reach and bed alarm set.   Therapy Documentation Precautions:  Precautions Precautions: Fall Restrictions Weight Bearing  Restrictions: No   Therapy/Group: Individual Therapy  Netta Corrigan, PT, DPT 01/12/2019, 7:51 AM

## 2019-01-13 ENCOUNTER — Encounter (HOSPITAL_COMMUNITY): Payer: BC Managed Care – PPO

## 2019-01-13 ENCOUNTER — Ambulatory Visit (HOSPITAL_COMMUNITY): Payer: BC Managed Care – PPO

## 2019-01-13 ENCOUNTER — Inpatient Hospital Stay (HOSPITAL_COMMUNITY): Payer: BC Managed Care – PPO

## 2019-01-13 LAB — GLUCOSE, CAPILLARY
Glucose-Capillary: 109 mg/dL — ABNORMAL HIGH (ref 70–99)
Glucose-Capillary: 141 mg/dL — ABNORMAL HIGH (ref 70–99)
Glucose-Capillary: 93 mg/dL (ref 70–99)
Glucose-Capillary: 130 mg/dL — ABNORMAL HIGH (ref 70–99)

## 2019-01-13 NOTE — Progress Notes (Signed)
Physical Therapy Discharge Summary  Patient Details  Name: [name] MRN: [id number] Date of Birth: September 27, 1950  Today's Date: 01/13/2019 PT Individual Time: 3903-0092 PT Individual Time Calculation (min): 57 min  Focused on grad day activities and preparation for d/c. Pt able to perform functional transfers with RW throughout session at overall modified independent level with extra time. Good safety awareness noted, checking if brakes locks and positioning of surfaces to himself. Performed simulated car transfer to sedan height to prepare for d/c home which pt able to perform at supervision level. Cues for technique and improved efficiency/safety with where to utilize UE support due to decreased ROM in LLE. Gait on ramp for community mobility training at supervision level. Discussed increased risk on uneven surfaces in home and community environments due to impaired balance and strength. Pt in agreement. Stair negotiation training for functional strengthening to aid with overall mobility on 3" steps with bilateral rails with overall supervision and step-to pattern. Pt instructed in and issued HEP for continued home program with plan to follow up with HHPT. See details below. Pt denies any concerns in regards to d/c tomorrow. Wife was present at start of session and then left - also denies concerns with level of care and assist. Education provided on energy conservation, importance of continued movement/exercise, and overall safety in the home in regards to mobility and fall risk. Pt in agreement and verbalized understanding. Pt able to gait on unit > 150' with supervision overall (details below) with RW.    Access Code: [email or web address]  URL: [email or web address]  Date: 01/13/2019  Prepared by: Canary Brim   Exercises Seated Long Arc Quad - 10 reps - 3 sets - 5 hold - 1x daily Seated March - 10 reps - 3 sets - 1x daily Sit to Stand with Counter Support - 5 reps - 3 sets - 1x  daily Standing Hip Abduction with Counter Support - 10 reps - 3 sets - 1x daily Standing Hamstring Curl with Chair Support - 10 reps - 3 sets - 1x daily Mini Squat with Counter Support - 10 reps - 3 sets - 1x daily - 7x weekly  Patient has met 9 of 9 long term goals due to improved activity tolerance, improved balance, improved postural control, increased strength, increased range of motion, decreased pain, ability to compensate for deficits and functional use of  left lower extremity.  Patient to discharge at an ambulatory level supervision/modified independent with RW.   Patient's care partner is independent to provide the necessary supervision assistance at discharge.  Reasons goals not met: n/a - goals met at this time  Recommendation:  Patient will benefit from ongoing skilled PT services in home health setting to continue to advance safe functional mobility, address ongoing impairments in strength, balance, endurance, functional mobility, ROM, and minimize fall risk.  Equipment: w/c. Pt already owns RW  Reasons for discharge: treatment goals met and discharge from hospital  Patient/family agrees with progress made and goals achieved: Yes  PT Discharge Precautions/Restrictions Precautions Precautions: (P) Fall Precaution Comments: (P) wounds on LLE Restrictions Weight Bearing Restrictions: (P) No Pain  Reports stiffness and soreness in LLE. No intervention required. Improved with mobility.  Vision/Perception  Perception Perception: (P) Within Functional Limits Praxis Praxis: (P) Intact  Cognition Overall Cognitive Status: (P) Within Functional Limits for tasks assessed Orientation Level: Oriented X4 Attention: Focused;Sustained Focused Attention: Appears intact Sustained Attention: Appears intact Memory: Appears intact Awareness: Appears intact Problem Solving: Appears intact Safety/Judgment: Appears intact Sensation Sensation  Light Touch: (P) Appears Intact(reports  tingling in R foot) Hot/Cold: Appears Intact Proprioception: (P) Appears Intact Stereognosis: Not tested Coordination Gross Motor Movements are Fluid and Coordinated: (P) Yes Fine Motor Movements are Fluid and Coordinated: (P) Yes Motor  Motor Motor: (P) Other (comment) Motor - Skilled Clinical Observations: generalized weakness/deconditioning Motor - Discharge Observations: (P) generalized weakness  Mobility Bed Mobility Bed Mobility: Supine to Sit;Sit to Supine Supine to Sit: Independent Sit to Supine: Independent Transfers Transfers: Sit to Stand;Stand to Sit;Stand Pivot Transfers Sit to Stand: Independent with assistive device Stand to Sit: Independent with assistive device Stand Pivot Transfers: Independent with assistive device Transfer (Assistive device): Rolling walker Locomotion  Gait Ambulation: (P) Yes Gait Assistance: Supervision/Verbal cueing Gait Distance (Feet): 200 Feet Assistive device: Rolling walker Gait Gait: Yes Gait Pattern: Impaired Gait Pattern: Decreased stance time - left;Decreased dorsiflexion - left;Antalgic Stairs / Additional Locomotion Stairs: Yes Stairs Assistance: Supervision/Verbal cueing(3" height) Stair Management Technique: Two rails Number of Stairs: 8 Height of Stairs: 3 Ramp: Supervision/Verbal cueing Wheelchair Mobility Wheelchair Mobility: Yes Wheelchair Assistance: Independent with assistive device Wheelchair Propulsion: Both upper extremities Distance: 150'  Trunk/Postural Assessment  Cervical Assessment Cervical Assessment: Within Functional Limits Thoracic Assessment Thoracic Assessment: Within Functional Limits Lumbar Assessment Lumbar Assessment: Within Functional Limits Postural Control Postural Control: Within Functional Limits  Balance Balance Balance Assessed: Yes Static Sitting Balance Static Sitting - Balance Support: Feet supported Static Sitting - Level of Assistance: 7: Independent Dynamic Sitting  Balance Dynamic Sitting - Balance Support: Feet supported Dynamic Sitting - Level of Assistance: 6: Modified independent (Device/Increase time) Static Standing Balance Static Standing - Level of Assistance: 6: Modified independent (Device/Increase time) Extremity Assessment  RUE Assessment RUE Assessment: Within Functional Limits LUE Assessment LUE Assessment: Within Functional Limits RLE Assessment General Strength Comments: grossly WFL; decreased muscular endurance LLE Assessment LLE Assessment: Exceptions to [facility] LLE Strength Left Hip Flexion: 2+/5 Left Knee Flexion: 2/5 Left Knee Extension: 3-/5 Left Ankle Dorsiflexion: 2-/5 Left Ankle Plantar Flexion: 2-/5    Canary Brim Ivory Broad, PT, DPT, CBIS  01/13/2019, 3:07 PM

## 2019-01-13 NOTE — Progress Notes (Signed)
Occupational Therapy Session Note  Patient Details  Name: [name] MRN: [id number] Date of Birth: [birth year]  Today's Date: 01/13/2019 OT Individual Time: 1100-1155 OT Individual Time Calculation (min): 55 min    Short Term Goals: Week 3:  OT Short Term Goal 1 (Week 3): STGs = LTGs  Skilled Therapeutic Interventions/Progress Updates:    Wife present for education.  Discussed home bathroom setup.  Pt unable to access toilet with RW at home.  Recommended that pt discuss with HHOT to determine safe method and practice.  Pt is getting BSC for use at home.  Pt not showering at this time 2/2 RLE wounds. Pt amb with RW to ADL apartment and gym without rest breaks.  Recommended pt not amb without supervision.  Pt can perform scoot transfer to [facility] when wife not present.  Pt and wife verbalized understanding of all recommendation.  Pt and wife pleased with progress and ready for discharge tomorrow.   Therapy Documentation Precautions:  Precautions Precautions: Fall Restrictions Weight Bearing Restrictions: No   Pain: Pt c/o R knee "stiffness"; repositioned and ice applied  Therapy/Group: Individual Therapy  Leroy Libman 01/13/2019, 12:39 PM

## 2019-01-13 NOTE — Progress Notes (Signed)
Occupational Therapy Discharge Summary  Patient Details  Name: [name] MRN: [id number] Date of Birth: 02-09-1951  Patient has met 8 of 8 long term goals due to improved activity tolerance, improved balance, and ability to compensate for deficits.   Pt made steady progress with BADLs and functional transfers/ambulation with RW during this admission.  Pt is supervision for all bathing/dressing tasks in addition to functional transfers.  [name] has attended therapy sessions and provides the appropriate level of supervision.  Pt and wife pleased with progress and ready for discharge home. Patient to discharge at overall Supervision level.  Patient's care partner is independent to provide the necessary physical assistance at discharge.     Recommendation:  Patient will benefit from ongoing skilled OT services in home health setting to continue to advance functional skills in the area of BADL and iADL.  Equipment: drop arm BSC  Reasons for discharge: treatment goals met  Patient/family agrees with progress made and goals achieved: Yes  OT Discharge Vision Baseline Vision/History: Wears glasses Wears Glasses: At all times Patient Visual Report: No change from baseline Vision Assessment?: No apparent visual deficits Perception  Perception: Within Functional Limits Praxis Praxis: Intact Cognition Overall Cognitive Status: Within Functional Limits for tasks assessed Orientation Level: Oriented X4 Attention: Focused;Sustained Focused Attention: Appears intact Sustained Attention: Appears intact Memory: Appears intact Awareness: Appears intact Problem Solving: Appears intact Safety/Judgment: Appears intact Sensation Sensation Light Touch: Appears Intact(BUE) Hot/Cold: Appears Intact Proprioception: Appears Intact Stereognosis: Not tested Coordination Gross Motor Movements are Fluid and Coordinated: Yes Fine Motor Movements are Fluid and Coordinated: Yes Motor   Motor Motor - Skilled Clinical Observations: generalized weakness/deconditioning     Trunk/Postural Assessment  Cervical Assessment Cervical Assessment: Within Functional Limits Thoracic Assessment Thoracic Assessment: Within Functional Limits Lumbar Assessment Lumbar Assessment: Within Functional Limits Postural Control Postural Control: Within Functional Limits  Balance Static Sitting Balance Static Sitting - Balance Support: Feet supported Static Sitting - Level of Assistance: 6: Modified independent (Device/Increase time) Dynamic Sitting Balance Dynamic Sitting - Balance Support: Feet supported Dynamic Sitting - Level of Assistance: 6: Modified independent (Device/Increase time) Extremity/Trunk Assessment RUE Assessment RUE Assessment: Within Functional Limits LUE Assessment LUE Assessment: Within Functional Limits   Leroy Libman 01/13/2019, 12:57 PM

## 2019-01-13 NOTE — Progress Notes (Signed)
Occupational Therapy Session Note  Patient Details  Name: [name] MRN: [id number] Date of Birth: [birth year]  Today's Date: 01/13/2019 OT Individual Time: 6629-4765 OT Individual Time Calculation (min): 75 min    Short Term Goals: Week 3:  OT Short Term Goal 1 (Week 3): STGs = LTGs  Skilled Therapeutic Interventions/Progress Updates:    OT intervention with focus on functional amb with RW, safety awareness, activity tolerance, BLE strengthening, and discharge planning.  Pt amb with RW to day room with one rest break.  NuStep level 5 for 10 mins. Pt amb with RW to ADL apartment and engaged in functional amb in home envirionment.  Pt returned to room and remained seated EOB with ice packs applied to R knee.  Bed alarm activated.  Therapy Documentation Precautions:  Precautions Precautions: Fall Restrictions Weight Bearing Restrictions: No   Pain: Pt c/o increased R knee "soreness" at end of session; repositioned and ice applied   Therapy/Group: Individual Therapy  Leroy Libman 01/13/2019, 9:53 AM

## 2019-01-13 NOTE — Progress Notes (Signed)
LLE wound healing well. Strong pulses DP/PT  with dopplers.   Lower leg   Wounds lower leg.     Upper wound on thigh

## 2019-01-13 NOTE — Plan of Care (Signed)
  Problem: Consults Goal: RH GENERAL PATIENT EDUCATION Description: See Patient Education module for education specifics. Outcome: Progressing   Problem: RH SKIN INTEGRITY Goal: RH STG SKIN FREE OF INFECTION/BREAKDOWN Description: With min assist Outcome: Progressing Goal: RH STG MAINTAIN SKIN INTEGRITY WITH ASSISTANCE Description: STG Maintain Skin Integrity With min Assistance. Outcome: Progressing Goal: RH STG ABLE TO PERFORM INCISION/WOUND CARE W/ASSISTANCE Description: STG Able To Perform Incision/Wound Care With  min Assistance. Outcome: Progressing   Problem: RH SAFETY Goal: RH STG ADHERE TO SAFETY PRECAUTIONS W/ASSISTANCE/DEVICE Description: STG Adhere to Safety Precautions With cues/reminders Outcome: Progressing   Problem: RH PAIN MANAGEMENT Goal: RH STG PAIN MANAGED AT OR BELOW PT'S PAIN GOAL Description: Manage pain at or below level 4 Outcome: Progressing   Problem: RH KNOWLEDGE DEFICIT GENERAL Goal: RH STG INCREASE KNOWLEDGE OF SELF CARE AFTER HOSPITALIZATION Outcome: Progressing

## 2019-01-13 NOTE — Progress Notes (Signed)
[place] PHYSICAL MEDICINE & REHABILITATION PROGRESS NOTE  Subjective/Complaints:  Labs reviewed Midline removed   ROS: Denies CP, shortness of breath, nausea, vomiting, diarrhea.  Objective: Vital Signs: Blood pressure 140/87, pulse 71, temperature 97.7 F (36.5 C), temperature source Oral, resp. rate 18, weight 99.3 kg, SpO2 98 %. No results found. Recent Labs    01/12/19 0655  WBC 5.6  HGB 9.4*  HCT 30.7*  PLT 370   Recent Labs    01/12/19 0655  NA 138  K 3.7  CL 102  CO2 25  GLUCOSE 132*  BUN 19  CREATININE 0.90  CALCIUM 9.2    Physical Exam: BP 140/87 (BP Location: Left Arm)   Pulse 71   Temp 97.7 F (36.5 C) (Oral)   Resp 18   Wt 99.3 kg   SpO2 98%   BMI 28.89 kg/m  Constitutional: No distress . Vital signs reviewed. HENT: Normocephalic.  Atraumatic. Eyes: EOMI.  No discharge. Cardiovascular: No JVD. Respiratory: Normal effort. GI: Non-distended. Musc: Left foot with digit mutations 2-5, tight left achilles Left lower extremity with edema and tenderness, continues to improve Neurological: He is alert and oriented Motor:  Right lower extremity: 4+/5 proximal distal, stable Left lower extremity: Hip flexion 3+/5, knee extension 3+/5, ankle dorsiflexion 3+/5 (some pain inhibition), stable Weak BLE hip int rotators No sensation in great toes bilat Skin: Left lower extremity with dressing C/D/I (please see PA note for pictures).  No erythema around it, unchanged. Psych: Normal mood.  Normal behavior.  Assessment/Plan: 1. Functional deficits secondary to debility which require 3+ hours per day of interdisciplinary therapy in a comprehensive inpatient rehab setting.  Physiatrist is providing close team supervision and 24 hour management of active medical problems listed below.  Physiatrist and rehab team continue to assess barriers to discharge/monitor patient progress toward functional and medical goals  Care Tool:  Bathing    Body parts  bathed by patient: Right arm, Left arm, Chest, Abdomen, Front perineal area, Buttocks, Right upper leg, Left upper leg, Face   Body parts bathed by helper: Buttocks, Right lower leg, Left lower leg Body parts n/a: Right lower leg, Left lower leg   Bathing assist Assist Level: Set up assist     Upper Body Dressing/Undressing Upper body dressing   What is the patient wearing?: Pull over shirt    Upper body assist Assist Level: Independent with assistive device    Lower Body Dressing/Undressing Lower body dressing      What is the patient wearing?: Pants     Lower body assist Assist for lower body dressing: Supervision/Verbal cueing     Toileting Toileting    Toileting assist Assist for toileting: Supervision/Verbal cueing     Transfers Chair/bed transfer  Transfers assist     Chair/bed transfer assist level: Contact Guard/Touching assist Chair/bed transfer assistive device: Programmer, multimedia   Ambulation assist   Ambulation activity did not occur: Safety/medical concerns  Assist level: Supervision/Verbal cueing Assistive device: Walker-rolling Max distance: 312 ft   Walk 10 feet activity   Assist  Walk 10 feet activity did not occur: Safety/medical concerns  Assist level: Supervision/Verbal cueing Assistive device: Walker-rolling   Walk 50 feet activity   Assist Walk 50 feet with 2 turns activity did not occur: Safety/medical concerns  Assist level: Supervision/Verbal cueing Assistive device: Walker-rolling    Walk 150 feet activity   Assist Walk 150 feet activity did not occur: Safety/medical concerns  Assist level: Supervision/Verbal cueing Assistive device:  Walker-rolling    Walk 10 feet on uneven surface  activity   Assist Walk 10 feet on uneven surfaces activity did not occur: Safety/medical concerns         Wheelchair     Assist Will patient use wheelchair at discharge?: No Type of Wheelchair: Manual     Wheelchair assist level: Independent Max wheelchair distance: 150    Wheelchair 50 feet with 2 turns activity    Assist        Assist Level: Independent   Wheelchair 150 feet activity     Assist     Assist Level: Independent      Medical Problem List and Plan: 1.Functional and mobility deficitssecondary to PAD, sepsis, chronic non-healing wounds LLE requiring wound washout. Pt significantly debilitated   Continue CIR PT, OT Team conference today please see physician documentation under team conference tab, met with team face-to-face to discuss problems,progress, and goals. Formulized individual treatment plan based on medical history, underlying problem and comorbidities.  2. Antithrombotics: -h/o DVT/anticoagulation:Pharmaceutical:Xarelto resumed -antiplatelet therapy: Plavix resumed. 3. Pain Management:Continue Lyrica at bedtime 4. Mood:LCSW to follow for evaluation and support -antipsychotic agents: N/A 5. Neuropsych:this patient iscapable of making decisions onhisown behalf.  Continue to provide positive encouragement 6. Skin/Wound Care:Wet-to-dry dressings dailyto LLE.  Wounds now with tracking-discussed with PA, packed with iodoform gauze, continue Add lac hydrin to left lower leg Take clinical images of wounds prior to D/C Remove midline if labs ok  7. Fluids/Electrolytes/Nutrition:Monitor I/Os.  Nutritional supplements for wound healing. 8.Strep B wound culture: Completed course of IV Ancef 9.T2DM: Monitor blood sugars AC at bedtime, numbness Left foot neuropathy, no right foot numbness   NovoLog 11 units 3 times a day started on 7/10  Lantus 30 units daily at bedtime   CBG (last 3)  Recent Labs    01/12/19 1632 01/12/19 2115 01/13/19 0610  GLUCAP 91 167* 109*  Controlled 7/21 Monitor with increased mobility 10 Iron deficiency: Continue to monitor with serial checks.             -iron supplement             Hemoglobin 8.9 on 7/17, 9.4 on 7/20   Continue to monitor 11.Constipation: On miralax.  12.  Hyponatremia  Sodium 135 on 7/17  Continue to monitor 13.  Hypoalbuminemia  Supplement initiated on 7/4 14.  Right knee OA  Ice for left knee pain, PRAFO was ordered to help with positioning-kickstand needs to be out    Voltaren gel started on 7/9  Continue ice  WBCs reviewed again, within normal limits and afebrile today     Improved 15.  Orthostasis improved   Negative orthostatics on 7/17  Abdominal binder as necessary 16.  Thrombocytosis  Platelets 435 on 7/17,370K on 7/20   LOS: 18 days A FACE TO [place] [name] 01/13/2019, 7:43 AM

## 2019-01-14 LAB — GLUCOSE, CAPILLARY: Glucose-Capillary: 101 mg/dL — ABNORMAL HIGH (ref 70–99)

## 2019-01-14 MED ORDER — CITALOPRAM HYDROBROMIDE 10 MG PO TABS: 10.0000 mg | ORAL_TABLET | Freq: Every day | ORAL | 1 refills | Status: DC

## 2019-01-14 MED ORDER — RIVAROXABAN 10 MG PO TABS: 10.0000 mg | ORAL_TABLET | Freq: Every day | ORAL | 1 refills | Status: DC

## 2019-01-14 MED ORDER — TRAMADOL HCL 50 MG PO TABS: 50.0000 mg | ORAL_TABLET | Freq: Four times a day (QID) | ORAL | 0 refills | Status: DC | PRN

## 2019-01-14 MED ORDER — FERROUS SULFATE 325 (65 FE) MG PO TABS: 325.0000 mg | ORAL_TABLET | Freq: Two times a day (BID) | ORAL | 0 refills | Status: DC

## 2019-01-14 MED ORDER — DICLOFENAC SODIUM 1 % TD GEL: 2.0000 g | Freq: Four times a day (QID) | TRANSDERMAL | Status: DC

## 2019-01-14 MED ORDER — ACETAMINOPHEN 325 MG PO TABS: 325.0000 mg | ORAL_TABLET | ORAL | Status: DC | PRN

## 2019-01-14 MED ORDER — POLYETHYLENE GLYCOL 3350 17 G PO PACK: 17.0000 g | PACK | Freq: Every day | ORAL | 0 refills | Status: DC | PRN

## 2019-01-14 NOTE — Progress Notes (Signed)
   Noted patient's plan for discharge today.  He has follow-up with m on August 7.  If there are wound issues he will call to be seen sooner.  Corlette Ciano C. Donzetta Matters, MD [facility] Office: [phone] Pager: [phone]

## 2019-01-14 NOTE — Progress Notes (Signed)
[place] PHYSICAL MEDICINE & REHABILITATION PROGRESS NOTE  Subjective/Complaints: Pt ready to go home   ROS: Denies CP, shortness of breath, nausea, vomiting, diarrhea.  Objective: Vital Signs: Blood pressure (!) 131/91, pulse 78, temperature 98.2 F (36.8 C), temperature source Oral, resp. rate 17, weight 99.3 kg, SpO2 100 %. No results found. Recent Labs    01/12/19 0655  WBC 5.6  HGB 9.4*  HCT 30.7*  PLT 370   Recent Labs    01/12/19 0655  NA 138  K 3.7  CL 102  CO2 25  GLUCOSE 132*  BUN 19  CREATININE 0.90  CALCIUM 9.2    Physical Exam: BP (!) 131/91 (BP Location: Right Arm)   Pulse 78   Temp 98.2 F (36.8 C) (Oral)   Resp 17   Wt 99.3 kg   SpO2 100%   BMI 28.89 kg/m  Constitutional: No distress . Vital signs reviewed. HENT: Normocephalic.  Atraumatic. Eyes: EOMI.  No discharge. Cardiovascular: No JVD. Respiratory: Normal effort. GI: Non-distended. Musc: Left foot with digit mutations 2-5, tight left achilles, left knee contracture ~15deg Left lower extremity with edema and tenderness, continues to improve Neurological: He is alert and oriented Motor:  Right lower extremity: 4+/5 proximal distal, stable Left lower extremity: Hip flexion 3+/5, knee extension 3+/5, ankle dorsiflexion 3+/5 (some pain inhibition), stable Weak BLE hip int rotators No sensation in great toes bilat Skin: Left lower extremity with dressing C/D/I (please see above for  7/21 pictures). Compared to 7/14, smaller wounds.Remain clean with good granulation tissue Psych: Normal mood.  Normal behavior.  Assessment/Plan:  1. Functional deficits secondary to debility Stable for D/C today F/u PCP in 3-4 weeks F/u PM&R 2 weeks See D/C summary  See D/C instructions  Care Tool:  Bathing    Body parts bathed by patient: Right arm, Left arm, Chest, Abdomen, Front perineal area, Buttocks, Right upper leg, Left upper leg, Face, Right lower leg   Body parts bathed  by helper: Buttocks, Right lower leg, Left lower leg Body parts n/a: Left lower leg   Bathing assist Assist Level: Supervision/Verbal cueing     Upper Body Dressing/Undressing Upper body dressing   What is the patient wearing?: Pull over shirt    Upper body assist Assist Level: Independent    Lower Body Dressing/Undressing Lower body dressing      What is the patient wearing?: Underwear/pull up, Pants     Lower body assist Assist for lower body dressing: Set up assist     Toileting Toileting    Toileting assist Assist for toileting: Supervision/Verbal cueing     Transfers Chair/bed transfer  Transfers assist     Chair/bed transfer assist level: Independent with assistive device Chair/bed transfer assistive device: Programmer, multimedia   Ambulation assist   Ambulation activity did not occur: Safety/medical concerns  Assist level: Supervision/Verbal cueing Assistive device: Walker-rolling Max distance: 200'   Walk 10 feet activity   Assist  Walk 10 feet activity did not occur: Safety/medical concerns  Assist level: Supervision/Verbal cueing Assistive device: Walker-rolling   Walk 50 feet activity   Assist Walk 50 feet with 2 turns activity did not occur: Safety/medical concerns  Assist level: Supervision/Verbal cueing Assistive device: Walker-rolling    Walk 150 feet activity   Assist Walk 150 feet activity did not occur: Safety/medical concerns  Assist level: Supervision/Verbal cueing Assistive device: Walker-rolling    Walk 10 feet on uneven surface  activity  Assist Walk 10 feet on uneven surfaces activity did not occur: Safety/medical concerns   Assist level: Supervision/Verbal cueing Assistive device: Walker-rolling   Wheelchair     Assist Will patient use wheelchair at discharge?: No Type of Wheelchair: Manual    Wheelchair assist level: Independent Max wheelchair distance: 150    Wheelchair 50 feet  with 2 turns activity    Assist        Assist Level: Independent   Wheelchair 150 feet activity     Assist     Assist Level: Independent      Medical Problem List and Plan: 1.Functional and mobility deficitssecondary to PAD, sepsis, chronic non-healing wounds LLE requiring wound washout. Pt significantly debilitated   Continue CIR PT, OT D/C today - emphasized need to keep Left knee straight   2. Antithrombotics: -h/o DVT/anticoagulation:Pharmaceutical:Xarelto resumed -antiplatelet therapy: Plavix resumed. 3. Pain Management:Continue Lyrica at bedtime 4. Mood:LCSW to follow for evaluation and support -antipsychotic agents: N/A 5. Neuropsych:this patient iscapable of making decisions onhisown behalf.  Continue to provide positive encouragement 6. Skin/Wound Care:Wet-to-dry dressings dailyto LLE.  Wounds now with tracking-discussed with PA, packed with iodoform gauze, continue Add lac hydrin to left lower leg Take clinical images of wounds prior to D/C Remove midline if labs ok  7. Fluids/Electrolytes/Nutrition:Monitor I/Os.  Nutritional supplements for wound healing. 8.Strep B wound culture: Completed course of IV Ancef 9.T2DM: Monitor blood sugars AC at bedtime, numbness Left foot neuropathy, no right foot numbness   NovoLog 11 units 3 times a day started on 7/10  Lantus 30 units daily at bedtime   CBG (last 3)  Recent Labs    01/13/19 1635 01/13/19 2058 01/14/19 0552  GLUCAP 93 130* 101*  Controlled 7/21 Monitor with increased mobility 10 Iron deficiency: Continue to monitor with serial checks.             -iron supplement            Hemoglobin 8.9 on 7/17, 9.4 on 7/20   Continue to monitor 11.Constipation: On miralax.  12.  Hyponatremia  Sodium 135 on 7/17  Continue to monitor 13.  Hypoalbuminemia  Supplement initiated on 7/4 14.  Right knee OA  Ice for left knee pain, PRAFO was ordered to help with  positioning-kickstand needs to be out    Voltaren gel started on 7/9  Continue ice  WBCs reviewed again, within normal limits and afebrile today     Improved 15.  Orthostasis improved   Negative orthostatics on 7/17  Abdominal binder as necessary 16.  Thrombocytosis  Platelets 435 on 7/17,370K on 7/20   LOS: 19 days A FACE TO [place] E  01/14/2019, 7:14 AM

## 2019-01-14 NOTE — Progress Notes (Signed)
Patient anticipates discharge home today, in good spirits, no acute distress

## 2019-01-14 NOTE — Discharge Instructions (Signed)
Inpatient Rehab Discharge Instructions  [name] Discharge date and time:   01/14/19  Activities/Precautions/ Functional Status: Activity: no lifting, driving, or strenuous exercise for till cleared by MD Diet: diabetic diet Low salt Wound Care: Cleanse wound with normal saline. Pack track with strip of iodoform guaze then cover with damp to dry dressing. Change daily. Contact MD if you develop any problems with your wound--redness, swelling, increase in pain, drainage or if you develop fever or chills.    Functional status:  ___ No restrictions     ___ Walk up steps independently _X__ 24/7 supervision/assistance   ___ Walk up steps with assistance ___ Intermittent supervision/assistance  ___ Bathe/dress independently ___ Walk with walker     ___ Bathe/dress with assistance ___ Walk Independently    ___ Shower independently ___ Walk with assistance    _X__ Shower with assistance _X__ No alcohol     ___ Return to work/school ________   Special Instructions: 1. Monitor blood sugars before meals and at bedtime. Take a small snack if bed sugar at bedtime is less than 100. 2. Eat protein snack twice a day between meals.  3. Start Victoza and place [place] 20 when you go home today. Follow up with Dr. Abbott Pao for adjustments in dose.    COMMUNITY REFERRALS UPON DISCHARGE:    Home Health:   PT, OT, RN   Agency:[facility] [place]   Date of last service:01/14/2019  Medical Equipment/Items Ordered:HOSPITAL BED, [facility] AND [facility]  Agency/Supplier:[facility]   [phone]    My questions have been answered and I understand these instructions. I will adhere to these goals and the provided educational materials after my discharge from the hospital.  Patient/Caregiver Signature _______________________________ Date __________  Clinician Signature _______________________________________ Date __________  Please bring this form and your  medication list with you to all your follow-up doctor's appointments.

## 2019-01-16 ENCOUNTER — Telehealth: Payer: Self-pay

## 2019-01-16 NOTE — Telephone Encounter (Signed)
Transitional Care Questions   Questions for our staff to ask patients on Transitional care 48 hour phone call:   1. Are you/is patient experiencing any problems since coming home? Are there any questions regarding any aspect of care? NO NEW ISSUES  2. Are there any questions regarding medications administration/dosing? Are meds being taken as prescribed? Patient should review meds with caller to confirm RECIEVED MEDS, TAKING AS PRESCRIBED  3. Have there been any falls? NO  4. Has Home Health been to the house and/or have they contacted you? If not, have you tried to contact them? Can we help you contact them? YES HH ALREADY CONTACTED   5. Are bowels and bladder emptying properly? Are there any unexpected incontinence issues? If applicable, is patient following bowel/bladder programs? NO PROBLEMS  6. Any fevers, problems with breathing, unexpected pain? NO  7. Are there any skin problems or new areas of breakdown? NO  8. Has the patient/family member arranged specialty MD follow up (ie cardiology/neurology/renal/surgical/etc)? Can we help arrange? YES  9. Does the patient need any other services or support that we can help arrange? NO  10. Are caregivers following through as expected in assisting the patient? YES  11. Has the patient quit smoking, drinking alcohol, or using drugs as recommended? NA

## 2019-01-23 ENCOUNTER — Encounter
Payer: BC Managed Care – PPO | Attending: Physical Medicine & Rehabilitation | Admitting: Physical Medicine & Rehabilitation

## 2019-01-23 ENCOUNTER — Other Ambulatory Visit: Payer: Self-pay

## 2019-01-29 ENCOUNTER — Telehealth: Payer: Self-pay | Admitting: Vascular Surgery

## 2019-01-29 ENCOUNTER — Telehealth (HOSPITAL_COMMUNITY): Payer: Self-pay | Admitting: Rehabilitation

## 2019-01-29 NOTE — Telephone Encounter (Signed)
Severiano Gilbert, Nurse with [facility] called.  Patient is having bicept pain after having his Pic line removed.    He says he has pain medicine but was concerned about the pain.    I advised Charisse that patient has an appt with [place] tomorrow and will be evaluated at that time.  Thurston Hole., LPN

## 2019-01-29 NOTE — Telephone Encounter (Signed)

## 2019-01-30 ENCOUNTER — Other Ambulatory Visit: Payer: Self-pay | Admitting: *Deleted

## 2019-01-30 ENCOUNTER — Ambulatory Visit (INDEPENDENT_AMBULATORY_CARE_PROVIDER_SITE_OTHER): Payer: Self-pay | Admitting: Family

## 2019-01-30 ENCOUNTER — Other Ambulatory Visit: Payer: Self-pay

## 2019-01-30 ENCOUNTER — Ambulatory Visit (INDEPENDENT_AMBULATORY_CARE_PROVIDER_SITE_OTHER)
Admission: RE | Admit: 2019-01-30 | Discharge: 2019-01-30 | Disposition: A | Discharge status: Home / Self Care | Payer: BC Managed Care – PPO | Source: Ambulatory Visit | Attending: Family | Admitting: Family

## 2019-01-30 ENCOUNTER — Ambulatory Visit (HOSPITAL_COMMUNITY)
Admission: RE | Admit: 2019-01-30 | Discharge: 2019-01-30 | Disposition: A | Discharge status: Home / Self Care | Payer: BC Managed Care – PPO | Source: Ambulatory Visit | Attending: Family | Admitting: Family

## 2019-01-30 ENCOUNTER — Encounter: Payer: Self-pay | Admitting: *Deleted

## 2019-01-30 ENCOUNTER — Encounter: Payer: Self-pay | Admitting: Family

## 2019-01-30 VITALS — BP 145/89 | HR 63 | Temp 97.5°F | Resp 20 | Ht 73.0 in | Wt 213.4 lb

## 2019-01-30 DIAGNOSIS — I739 Peripheral vascular disease, unspecified: Secondary | ICD-10-CM | POA: Insufficient documentation

## 2019-01-30 DIAGNOSIS — I70202 Unspecified atherosclerosis of native arteries of extremities, left leg: Secondary | ICD-10-CM

## 2019-01-30 DIAGNOSIS — I779 Disorder of arteries and arterioles, unspecified: Secondary | ICD-10-CM

## 2019-01-30 NOTE — Progress Notes (Signed)
Message left for patient to be at [facility] admitting at 8:30 am on 02/09/2019 and plan overnight stay possible.
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
## 2019-02-05 ENCOUNTER — Other Ambulatory Visit (HOSPITAL_COMMUNITY)
Admission: RE | Admit: 2019-02-05 | Discharge: 2019-02-05 | Disposition: A | Discharge status: Home / Self Care | Payer: BC Managed Care – PPO | Source: Ambulatory Visit | Attending: Vascular Surgery | Admitting: Vascular Surgery

## 2019-02-05 DIAGNOSIS — Z20828 Contact with and (suspected) exposure to other viral communicable diseases: Secondary | ICD-10-CM | POA: Diagnosis not present

## 2019-02-05 DIAGNOSIS — Z01812 Encounter for preprocedural laboratory examination: Secondary | ICD-10-CM | POA: Insufficient documentation

## 2019-02-05 LAB — SARS CORONAVIRUS 2 (TAT 6-24 HRS): SARS Coronavirus 2: NEGATIVE

## 2019-02-09 ENCOUNTER — Other Ambulatory Visit: Payer: Self-pay

## 2019-02-09 ENCOUNTER — Encounter (HOSPITAL_COMMUNITY)
Admission: RE | Disposition: A | Discharge status: Home / Self Care | Payer: Self-pay | Source: Home / Self Care | Attending: Vascular Surgery

## 2019-02-09 ENCOUNTER — Ambulatory Visit (HOSPITAL_COMMUNITY)
Admission: RE | Admit: 2019-02-09 | Discharge: 2019-02-09 | Disposition: A | Discharge status: Home / Self Care | Payer: BC Managed Care – PPO | Attending: Vascular Surgery | Admitting: Vascular Surgery

## 2019-02-09 DIAGNOSIS — N183 Chronic kidney disease, stage 3 (moderate): Secondary | ICD-10-CM | POA: Insufficient documentation

## 2019-02-09 DIAGNOSIS — I129 Hypertensive chronic kidney disease with stage 1 through stage 4 chronic kidney disease, or unspecified chronic kidney disease: Secondary | ICD-10-CM | POA: Diagnosis not present

## 2019-02-09 DIAGNOSIS — G4733 Obstructive sleep apnea (adult) (pediatric): Secondary | ICD-10-CM | POA: Diagnosis not present

## 2019-02-09 DIAGNOSIS — Z9641 Presence of insulin pump (external) (internal): Secondary | ICD-10-CM | POA: Diagnosis not present

## 2019-02-09 DIAGNOSIS — Z794 Long term (current) use of insulin: Secondary | ICD-10-CM | POA: Diagnosis not present

## 2019-02-09 DIAGNOSIS — Z7902 Long term (current) use of antithrombotics/antiplatelets: Secondary | ICD-10-CM | POA: Diagnosis not present

## 2019-02-09 DIAGNOSIS — Z87891 Personal history of nicotine dependence: Secondary | ICD-10-CM | POA: Insufficient documentation

## 2019-02-09 DIAGNOSIS — T82898A Other specified complication of vascular prosthetic devices, implants and grafts, initial encounter: Secondary | ICD-10-CM | POA: Diagnosis not present

## 2019-02-09 DIAGNOSIS — E1151 Type 2 diabetes mellitus with diabetic peripheral angiopathy without gangrene: Secondary | ICD-10-CM | POA: Insufficient documentation

## 2019-02-09 DIAGNOSIS — E7800 Pure hypercholesterolemia, unspecified: Secondary | ICD-10-CM | POA: Insufficient documentation

## 2019-02-09 HISTORY — PX: ABDOMINAL AORTOGRAM W/LOWER EXTREMITY: CATH118223

## 2019-02-09 LAB — POCT I-STAT, CHEM 8
BUN: 20 mg/dL (ref 8–23)
Calcium, Ion: 1.15 mmol/L (ref 1.15–1.40)
Chloride: 104 mmol/L (ref 98–111)
Creatinine, Ser: 1.4 mg/dL — ABNORMAL HIGH (ref 0.61–1.24)
Glucose, Bld: 113 mg/dL — ABNORMAL HIGH (ref 70–99)
HCT: 34 % — ABNORMAL LOW (ref 39.0–52.0)
Hemoglobin: 11.6 g/dL — ABNORMAL LOW (ref 13.0–17.0)
Potassium: 3.5 mmol/L (ref 3.5–5.1)
Sodium: 139 mmol/L (ref 135–145)
TCO2: 22 mmol/L (ref 22–32)

## 2019-02-09 SURGERY — ABDOMINAL AORTOGRAM W/LOWER EXTREMITY
Anesthesia: LOCAL

## 2019-02-09 MED ORDER — LABETALOL HCL 5 MG/ML IV SOLN: 10.0000 mg | INTRAVENOUS | Status: DC | PRN

## 2019-02-09 MED ORDER — LIDOCAINE HCL (PF) 1 % IJ SOLN
INTRAMUSCULAR | Status: DC | PRN
  Administered 2019-02-09: 15 mL

## 2019-02-09 MED ORDER — ONDANSETRON HCL 4 MG/2ML IJ SOLN: 4.0000 mg | Freq: Four times a day (QID) | INTRAMUSCULAR | Status: DC | PRN

## 2019-02-09 MED ORDER — SODIUM CHLORIDE 0.9 % IV SOLN: INTRAVENOUS | Status: DC

## 2019-02-09 MED ORDER — HYDRALAZINE HCL 20 MG/ML IJ SOLN: 5.0000 mg | INTRAMUSCULAR | Status: DC | PRN

## 2019-02-09 MED ORDER — LIDOCAINE HCL (PF) 1 % IJ SOLN
INTRAMUSCULAR | Status: AC
  Filled 2019-02-09: qty 30

## 2019-02-09 MED ORDER — OXYCODONE HCL 5 MG PO TABS: 5.0000 mg | ORAL_TABLET | ORAL | Status: DC | PRN

## 2019-02-09 MED ORDER — MIDAZOLAM HCL 2 MG/2ML IJ SOLN
INTRAMUSCULAR | Status: AC
  Filled 2019-02-09: qty 2

## 2019-02-09 MED ORDER — SODIUM CHLORIDE 0.9% FLUSH: 3.0000 mL | Freq: Two times a day (BID) | INTRAVENOUS | Status: DC

## 2019-02-09 MED ORDER — ACETAMINOPHEN 325 MG PO TABS: 650.0000 mg | ORAL_TABLET | ORAL | Status: DC | PRN

## 2019-02-09 MED ORDER — MIDAZOLAM HCL 2 MG/2ML IJ SOLN
INTRAMUSCULAR | Status: DC | PRN
  Administered 2019-02-09: 1 mg via INTRAVENOUS

## 2019-02-09 MED ORDER — IODIXANOL 320 MG/ML IV SOLN
INTRAVENOUS | Status: DC | PRN
  Administered 2019-02-09: 67 mL via INTRAVENOUS

## 2019-02-09 MED ORDER — FENTANYL CITRATE (PF) 100 MCG/2ML IJ SOLN
INTRAMUSCULAR | Status: AC
  Filled 2019-02-09: qty 2

## 2019-02-09 MED ORDER — HEPARIN (PORCINE) IN NACL 1000-0.9 UT/500ML-% IV SOLN
INTRAVENOUS | Status: DC | PRN
  Administered 2019-02-09 (×2): 500 mL

## 2019-02-09 MED ORDER — SODIUM CHLORIDE 0.9% FLUSH: 3.0000 mL | INTRAVENOUS | Status: DC | PRN

## 2019-02-09 MED ORDER — HEPARIN (PORCINE) IN NACL 1000-0.9 UT/500ML-% IV SOLN
INTRAVENOUS | Status: AC
  Filled 2019-02-09: qty 1000

## 2019-02-09 MED ORDER — SODIUM CHLORIDE 0.9 % IV SOLN: 250.0000 mL | INTRAVENOUS | Status: DC | PRN

## 2019-02-09 MED ORDER — MORPHINE SULFATE (PF) 10 MG/ML IV SOLN: 2.0000 mg | INTRAVENOUS | Status: DC | PRN

## 2019-02-09 MED ORDER — SODIUM CHLORIDE 0.9 % IV SOLN
INTRAVENOUS | Status: DC
  Administered 2019-02-09: 09:00:00 via INTRAVENOUS

## 2019-02-09 MED ORDER — FENTANYL CITRATE (PF) 100 MCG/2ML IJ SOLN
INTRAMUSCULAR | Status: DC | PRN
  Administered 2019-02-09: 50 ug via INTRAVENOUS

## 2019-02-09 SURGICAL SUPPLY — 11 items
CATH OMNI FLUSH 5F 65CM (CATHETERS) ×1 IMPLANT
CLOSURE MYNX CONTROL 5F (Vascular Products) ×1 IMPLANT
KIT HEART LEFT (KITS) ×3 IMPLANT
KIT MICROPUNCTURE NIT STIFF (SHEATH) ×1 IMPLANT
KIT PV (KITS) ×3 IMPLANT
SHEATH PINNACLE 5F 10CM (SHEATH) ×1 IMPLANT
SHEATH PROBE COVER 6X72 (BAG) ×1 IMPLANT
SYR MEDRAD MARK V 150ML (SYRINGE) ×1 IMPLANT
TRANSDUCER W/STOPCOCK (MISCELLANEOUS) ×3 IMPLANT
TRAY PV CATH (CUSTOM PROCEDURE TRAY) ×3 IMPLANT
WIRE BENTSON .035X145CM (WIRE) ×1 IMPLANT

## 2019-02-09 NOTE — Discharge Instructions (Signed)
Femoral Site Care This sheet gives you information about how to care for yourself after your procedure. Your health care provider may also give you more specific instructions. If you have problems or questions, contact your health care provider. What can I expect after the procedure? After the procedure, it is common to have:  Bruising that usually fades within 1-2 weeks.  Tenderness at the site. Follow these instructions at home: Wound care  Follow instructions from your health care provider about how to take care of your insertion site. Make sure you: ? Wash your hands with soap and water before you change your bandage (dressing). If soap and water are not available, use hand sanitizer. ? Change your dressing as told by your health care provider.  Do not take baths, swim, or use a hot tub for 5 days.  You may shower 24-48 hours after the procedure or as told by your health care provider. ? Gently wash the site with plain soap and water. ? Pat the area dry with a clean towel. ? Do not rub the site. This may cause bleeding.  Do not apply powder or lotion to the site. Keep the site clean and dry.  Check your femoral site every day for signs of infection. Check for: ? Redness, swelling, or pain. ? Fluid or blood. ? Warmth. ? Pus or a bad smell. Activity  For the first 2-3 days after your procedure, or as long as directed: ? Avoid climbing stairs as much as possible. ? Do not squat.  Do not lift anything that is heavier than 10 lb for 5 days.  Rest as directed. ? Avoid sitting for a long time without moving. Get up to take short walks every 1-2 hours.  Do not drive for 24 hours if you were given a medicine to help you relax (sedative). General instructions  Take over-the-counter and prescription medicines only as told by your health care provider.  Keep all follow-up visits as told by your health care provider. This is important. Contact a health care provider if you  have:  A fever or chills.  You have redness, swelling, or pain around your insertion site. Get help right away if:  The catheter insertion area swells very fast.  You pass out.  You suddenly start to sweat or your skin gets clammy.  The catheter insertion area is bleeding, and the bleeding does not stop when you hold steady pressure on the area.  The area near or just beyond the catheter insertion site becomes pale, cool, tingly, or numb. These symptoms may represent a serious problem that is an emergency. Do not wait to see if the symptoms will go away. Get medical help right away. Call your local emergency services (911 in the [place] Do not drive yourself to the hospital. Summary  After the procedure, it is common to have bruising that usually fades within 1-2 weeks.  Check your femoral site every day for signs of infection.  Do not lift anything that is heavier than 10 lb for 5 days/ This information is not intended to replace advice given to you by your health care provider. Make sure you discuss any questions you have with your health care provider. Document Released: 02/12/2014 Document Revised: 06/24/2017 Document Reviewed: 06/24/2017 Elsevier Patient Education  2020 [employer].

## 2019-02-09 NOTE — H&P (Signed)
   History and Physical Update  The patient was interviewed and re-examined.  The patient's previous History and Physical has been reviewed and is unchanged from recent office visit. Plan for evaluation of left fem-AT bypass today with possible intervention.   Amar Sippel C. Donzetta Matters, MD [facility] [place] Office: [phone] Pager: [phone]   02/09/2019, 9:06 AM

## 2019-02-09 NOTE — Op Note (Signed)
    Patient name: [name] MRN: [id number] DOB: 11/25/1950 Sex: male  02/09/2019 Pre-operative Diagnosis: Left lower extremity bypass with monophasic flow Post-operative diagnosis:  Same Surgeon:  Erlene Quan C. Donzetta Matters, MD Procedure Performed: 1.  Ultrasound-guided cannulation right common femoral artery 2.  Aortogram with left lower extremity runoff via selection of left common femoral artery 3.  Moderate sedation with fentanyl and Versed for 17 minutes 4.  Minx device closure right common femoral artery  Indications: 68 year old male has undergone a left SFA to anterior tibial artery bypass.  He has demonstrated monophasic flow.  He is healing his wounds in his leg from recent leg debridement.  Left foot wound has healed.  There is a palpable anterior tibial pulse at the ankle.  Findings: Bypass is patent without any flow-limiting stenosis.   Procedure:  The patient was identified in the holding area and taken to room 8.  The patient was then placed supine on the table and prepped and draped in the usual sterile fashion.  A time out was called.  Ultrasound was used to evaluate the right common femoral artery which was noted be patent and compressible.  There is anesthetized 1% lidocaine cannulated micropuncture needle followed by wire and sheath.  [place]on wire was placed followed by 5 [place] sheath.  Omni catheter placed to level of L1 aortogram performed.  We crossed the bifurcation selected the left common femoral artery perform left lower extremity angiography with the above findings.  Catheter was removed over wire.  Minx device was used to close the right common femoral artery cannulation site.  He tolerated procedure well without immediate complication.  Contrast: 67cc   Ronnel Zuercher C. Donzetta Matters, MD [facility] [place] Office: [phone] Pager: [phone]

## 2019-02-10 ENCOUNTER — Encounter (HOSPITAL_COMMUNITY): Payer: Self-pay | Admitting: Vascular Surgery

## 2019-02-26 ENCOUNTER — Other Ambulatory Visit: Payer: Self-pay | Admitting: Physical Medicine and Rehabilitation

## 2019-02-27 ENCOUNTER — Other Ambulatory Visit: Payer: Self-pay

## 2019-03-04 ENCOUNTER — Other Ambulatory Visit: Payer: Self-pay | Admitting: Vascular Surgery

## 2019-03-08 ENCOUNTER — Other Ambulatory Visit: Payer: Self-pay | Admitting: Physical Medicine and Rehabilitation

## 2019-03-09 ENCOUNTER — Other Ambulatory Visit: Payer: Self-pay | Admitting: Physical Medicine and Rehabilitation

## 2019-03-18 ENCOUNTER — Other Ambulatory Visit: Payer: Self-pay | Admitting: Physical Medicine and Rehabilitation

## 2019-03-18 ENCOUNTER — Other Ambulatory Visit: Payer: Self-pay | Admitting: *Deleted

## 2019-03-19 ENCOUNTER — Telehealth: Payer: Self-pay

## 2019-03-19 DIAGNOSIS — I779 Disorder of arteries and arterioles, unspecified: Secondary | ICD-10-CM

## 2019-03-19 MED ORDER — RIVAROXABAN 10 MG PO TABS: 10.0000 mg | ORAL_TABLET | Freq: Every day | ORAL | 3 refills | Status: DC

## 2019-03-19 NOTE — Telephone Encounter (Signed)
Pt called regarding the dose of his Xarelto and wanted to know if he should continue at the dose of 10mg  as he has been on the 20mg  in the past after the amputation.   Advised pt to call Reesa Chew who has been managing this for the past 3 months. He said that she has now discharged him from her care as she was just for rehab and he is now done.   Advised him that I would speak with one of the physicians here at the office and refill the medication this afternoon if they are ok with this.   York Cerise, CMA

## 2019-05-01 ENCOUNTER — Other Ambulatory Visit: Payer: Self-pay

## 2019-05-01 DIAGNOSIS — I779 Disorder of arteries and arterioles, unspecified: Secondary | ICD-10-CM

## 2019-05-07 ENCOUNTER — Telehealth (HOSPITAL_COMMUNITY): Payer: Self-pay | Admitting: *Deleted

## 2019-05-07 NOTE — Telephone Encounter (Signed)
The above patient or their representative was contacted and gave the following answers to these questions:         Do you have any of the following symptoms?    NO  Fever                    Cough                   Shortness of breath n Do  you have any of the following other symptoms? n   muscle pain         vomiting,        diarrhea        rash         weakness        red eye        abdominal pain         bruising          bruising or bleeding              joint pain           severe headache    Have you been in contact with someone who was or has been sick in the past 2 weeks?  NO  Yes                 Unsure                         Unable to assess   Does the person that you were in contact with have any of the following symptoms?   Cough         shortness of breath           muscle pain         vomiting,            diarrhea            rash            weakness           fever            red eye           abdominal pain           bruising  or  bleeding                joint pain                severe headache                 COMMENTS OR ACTION PLAN FOR THIS PATIENT:

## 2019-05-08 ENCOUNTER — Ambulatory Visit (HOSPITAL_COMMUNITY)
Admission: RE | Admit: 2019-05-08 | Discharge: 2019-05-08 | Disposition: A | Discharge status: Home / Self Care | Payer: BC Managed Care – PPO | Source: Ambulatory Visit | Attending: Vascular Surgery | Admitting: Vascular Surgery

## 2019-05-08 ENCOUNTER — Other Ambulatory Visit: Payer: Self-pay

## 2019-05-08 ENCOUNTER — Encounter: Payer: Self-pay | Admitting: Family

## 2019-05-08 ENCOUNTER — Ambulatory Visit (INDEPENDENT_AMBULATORY_CARE_PROVIDER_SITE_OTHER): Payer: BC Managed Care – PPO | Admitting: Physician Assistant

## 2019-05-08 ENCOUNTER — Ambulatory Visit (INDEPENDENT_AMBULATORY_CARE_PROVIDER_SITE_OTHER)
Admission: RE | Admit: 2019-05-08 | Discharge: 2019-05-08 | Disposition: A | Discharge status: Home / Self Care | Payer: BC Managed Care – PPO | Source: Ambulatory Visit | Attending: Vascular Surgery | Admitting: Vascular Surgery

## 2019-05-08 VITALS — BP 121/80 | HR 73 | Temp 97.4°F | Resp 20 | Ht 74.0 in | Wt 240.0 lb

## 2019-05-08 DIAGNOSIS — I779 Disorder of arteries and arterioles, unspecified: Secondary | ICD-10-CM | POA: Diagnosis not present

## 2019-05-08 DIAGNOSIS — T8130XD Disruption of wound, unspecified, subsequent encounter: Secondary | ICD-10-CM | POA: Diagnosis not present
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
## 2019-05-12 ENCOUNTER — Other Ambulatory Visit: Payer: Self-pay

## 2019-05-12 DIAGNOSIS — I779 Disorder of arteries and arterioles, unspecified: Secondary | ICD-10-CM

## 2019-06-30 ENCOUNTER — Ambulatory Visit: Payer: BC Managed Care – PPO | Attending: Internal Medicine

## 2019-06-30 DIAGNOSIS — Z20822 Contact with and (suspected) exposure to covid-19: Secondary | ICD-10-CM

## 2019-07-01 LAB — NOVEL CORONAVIRUS, NAA: SARS-CoV-2, NAA: DETECTED — AB

## 2019-07-06 ENCOUNTER — Other Ambulatory Visit: Payer: Self-pay | Admitting: Vascular Surgery

## 2019-07-06 DIAGNOSIS — I779 Disorder of arteries and arterioles, unspecified: Secondary | ICD-10-CM

## 2019-07-16 ENCOUNTER — Other Ambulatory Visit: Payer: Self-pay | Admitting: Cardiology

## 2019-07-16 DIAGNOSIS — I739 Peripheral vascular disease, unspecified: Secondary | ICD-10-CM

## 2019-07-20 ENCOUNTER — Other Ambulatory Visit: Payer: BC Managed Care – PPO

## 2019-07-20 ENCOUNTER — Ambulatory Visit: Payer: BC Managed Care – PPO | Attending: Internal Medicine

## 2019-07-20 DIAGNOSIS — Z20822 Contact with and (suspected) exposure to covid-19: Secondary | ICD-10-CM

## 2019-07-21 LAB — NOVEL CORONAVIRUS, NAA: SARS-CoV-2, NAA: NOT DETECTED

## 2019-10-12 ENCOUNTER — Telehealth: Payer: Self-pay | Admitting: *Deleted

## 2019-10-12 ENCOUNTER — Other Ambulatory Visit: Payer: Self-pay | Admitting: *Deleted

## 2019-10-12 DIAGNOSIS — M79662 Pain in left lower leg: Secondary | ICD-10-CM

## 2019-10-12 DIAGNOSIS — M7989 Other specified soft tissue disorders: Secondary | ICD-10-CM

## 2019-10-12 DIAGNOSIS — I739 Peripheral vascular disease, unspecified: Secondary | ICD-10-CM

## 2019-10-12 NOTE — Telephone Encounter (Signed)
Returned call to patient.  C/O left leg swelling and "hardened" area behind left knee after working in yard over the weekend.  He states that he is not having pain at this time, just difficult to stand and get moving, once up he is ok.  An appointment was given 10/16/2019 to have a left lower extremity arterial duplex and to see the MD.  At the time, the lab schedule could not add an ABI study.  I explained to the patient if symptoms should worsen before appointment he should report to the ED.  Patient voiced understanding of the instructions.

## 2019-10-15 ENCOUNTER — Telehealth (HOSPITAL_COMMUNITY): Payer: Self-pay

## 2019-10-15 NOTE — Telephone Encounter (Signed)
The above patient or their representative was contacted and gave the following answers to these questions:         Do you have any of the following symptoms?    NO  Fever                    Cough                   Shortness of breath  Do  you have any of the following other symptoms?    muscle pain         vomiting,        diarrhea        rash         weakness        red eye        abdominal pain         bruising          bruising or bleeding              joint pain           severe headache    Have you been in contact with someone who was or has been sick in the past 2 weeks?  NO  Yes                 Unsure                         Unable to assess   Does the person that you were in contact with have any of the following symptoms?   Cough         shortness of breath           muscle pain         vomiting,            diarrhea            rash            weakness           fever            red eye           abdominal pain           bruising  or  bleeding                joint pain                severe headache                 COMMENTS OR ACTION PLAN FOR THIS PATIENT:         PT IS FULLY VACCINATED/[facility] 

## 2019-10-16 ENCOUNTER — Other Ambulatory Visit: Payer: Self-pay

## 2019-10-16 ENCOUNTER — Ambulatory Visit (HOSPITAL_COMMUNITY)
Admission: RE | Admit: 2019-10-16 | Discharge: 2019-10-16 | Disposition: A | Discharge status: Home / Self Care | Payer: BC Managed Care – PPO | Source: Ambulatory Visit | Attending: Vascular Surgery | Admitting: Vascular Surgery

## 2019-10-16 ENCOUNTER — Ambulatory Visit (INDEPENDENT_AMBULATORY_CARE_PROVIDER_SITE_OTHER): Payer: BC Managed Care – PPO | Admitting: Vascular Surgery

## 2019-10-16 ENCOUNTER — Encounter: Payer: Self-pay | Admitting: Vascular Surgery

## 2019-10-16 VITALS — BP 107/67 | HR 75 | Temp 97.1°F | Resp 20 | Ht 74.0 in | Wt 240.0 lb

## 2019-10-16 DIAGNOSIS — M79662 Pain in left lower leg: Secondary | ICD-10-CM | POA: Insufficient documentation

## 2019-10-16 DIAGNOSIS — M7989 Other specified soft tissue disorders: Secondary | ICD-10-CM

## 2019-10-16 DIAGNOSIS — I739 Peripheral vascular disease, unspecified: Secondary | ICD-10-CM

## 2019-10-16 MED ORDER — SULFAMETHOXAZOLE-TRIMETHOPRIM 400-80 MG PO TABS
1.0000 | ORAL_TABLET | Freq: Two times a day (BID) | ORAL | 0 refills | Status: DC
Start: 2019-10-16 — End: 2019-11-07
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
## 2019-10-19 ENCOUNTER — Other Ambulatory Visit: Payer: Self-pay

## 2019-10-19 ENCOUNTER — Inpatient Hospital Stay (HOSPITAL_COMMUNITY)
Admission: RE | Admit: 2019-10-19 | Discharge: 2019-10-19 | Disposition: A | Discharge status: Home / Self Care | Payer: BC Managed Care – PPO | Source: Ambulatory Visit | Attending: Vascular Surgery | Admitting: Vascular Surgery

## 2019-10-19 ENCOUNTER — Encounter: Payer: BC Managed Care – PPO | Admitting: Vascular Surgery

## 2019-10-19 DIAGNOSIS — M7989 Other specified soft tissue disorders: Secondary | ICD-10-CM

## 2019-10-19 DIAGNOSIS — M79605 Pain in left leg: Secondary | ICD-10-CM

## 2019-10-19 DIAGNOSIS — I998 Other disorder of circulatory system: Secondary | ICD-10-CM | POA: Insufficient documentation

## 2019-10-19 DIAGNOSIS — I739 Peripheral vascular disease, unspecified: Secondary | ICD-10-CM

## 2019-10-19 DIAGNOSIS — I70229 Atherosclerosis of native arteries of extremities with rest pain, unspecified extremity: Secondary | ICD-10-CM

## 2019-10-19 DIAGNOSIS — M79662 Pain in left lower leg: Secondary | ICD-10-CM

## 2019-10-19 LAB — POCT I-STAT CREATININE: Creatinine, Ser: 2 mg/dL — ABNORMAL HIGH (ref 0.61–1.24)

## 2019-10-19 MED ORDER — SODIUM CHLORIDE (PF) 0.9 % IJ SOLN
INTRAMUSCULAR | Status: AC
  Filled 2019-10-19: qty 50

## 2019-10-19 MED ORDER — IOHEXOL 300 MG/ML  SOLN
75.0000 mL | Freq: Once | INTRAMUSCULAR | Status: AC | PRN
  Administered 2019-10-19: 75 mL via INTRAVENOUS

## 2019-10-20 ENCOUNTER — Other Ambulatory Visit: Payer: Self-pay

## 2019-10-20 ENCOUNTER — Encounter (HOSPITAL_COMMUNITY): Payer: Self-pay | Admitting: Vascular Surgery

## 2019-10-20 NOTE — Progress Notes (Signed)
Spoke with pt for pre-op call. Pt denies cardiac history, does have peripheral vascular disease. Pt is a type 2 diabetic. States his A1C was 7.? Done this week. He states his fasting blood sugar usually runs around 200. Pt has V Go insulin pump. Instructed pt to decrease the basal rate by 20% tonight at midnight. Instructed him to check his blood sugar when he gets up in the AM and every 2 hours until he leaves for the hospital. If blood sugar is 70 or below, treat with 1/2 cup of clear juice (apple or cranberry) and recheck blood sugar 15 minutes after drinking juice. If blood sugar continues to be 70 or below, call the Short Stay department and ask to speak to a nurse. Pt voiced understanding.  Left a voicemail on the Diabetes Coordinator office number notifying them of pt's surgery time and arrival time.   Covid test to be done at 8:10 AM. He understands that he needs to quarantine after the test in the morning until he comes to the hospital at 11:10 AM. He voiced understanding.

## 2019-10-21 ENCOUNTER — Other Ambulatory Visit (HOSPITAL_COMMUNITY): Payer: BC Managed Care – PPO

## 2019-10-21 ENCOUNTER — Inpatient Hospital Stay (HOSPITAL_COMMUNITY): Payer: BC Managed Care – PPO | Admitting: Anesthesiology

## 2019-10-21 ENCOUNTER — Encounter (HOSPITAL_COMMUNITY)
Admission: RE | Disposition: A | Discharge status: Skilled Nursing Facility | Payer: Self-pay | Source: Home / Self Care | Attending: Vascular Surgery

## 2019-10-21 ENCOUNTER — Encounter (HOSPITAL_COMMUNITY): Payer: Self-pay | Admitting: Vascular Surgery

## 2019-10-21 ENCOUNTER — Inpatient Hospital Stay (HOSPITAL_COMMUNITY)
Admission: RE | Admit: 2019-10-21 | Discharge: 2019-11-07 | DRG: 263 | Disposition: A | Discharge status: Skilled Nursing Facility | Payer: BC Managed Care – PPO | Attending: Vascular Surgery | Admitting: Vascular Surgery

## 2019-10-21 DIAGNOSIS — E1122 Type 2 diabetes mellitus with diabetic chronic kidney disease: Secondary | ICD-10-CM | POA: Diagnosis present

## 2019-10-21 DIAGNOSIS — I998 Other disorder of circulatory system: Secondary | ICD-10-CM | POA: Diagnosis present

## 2019-10-21 DIAGNOSIS — L97529 Non-pressure chronic ulcer of other part of left foot with unspecified severity: Secondary | ICD-10-CM | POA: Diagnosis present

## 2019-10-21 DIAGNOSIS — Z87891 Personal history of nicotine dependence: Secondary | ICD-10-CM | POA: Diagnosis not present

## 2019-10-21 DIAGNOSIS — D649 Anemia, unspecified: Secondary | ICD-10-CM | POA: Diagnosis present

## 2019-10-21 DIAGNOSIS — Z8619 Personal history of other infectious and parasitic diseases: Secondary | ICD-10-CM | POA: Diagnosis not present

## 2019-10-21 DIAGNOSIS — N183 Chronic kidney disease, stage 3 unspecified: Secondary | ICD-10-CM | POA: Diagnosis present

## 2019-10-21 DIAGNOSIS — A4901 Methicillin susceptible Staphylococcus aureus infection, unspecified site: Secondary | ICD-10-CM | POA: Diagnosis not present

## 2019-10-21 DIAGNOSIS — Z9641 Presence of insulin pump (external) (internal): Secondary | ICD-10-CM | POA: Diagnosis present

## 2019-10-21 DIAGNOSIS — R58 Hemorrhage, not elsewhere classified: Secondary | ICD-10-CM | POA: Diagnosis not present

## 2019-10-21 DIAGNOSIS — B951 Streptococcus, group B, as the cause of diseases classified elsewhere: Secondary | ICD-10-CM | POA: Diagnosis present

## 2019-10-21 DIAGNOSIS — B9562 Methicillin resistant Staphylococcus aureus infection as the cause of diseases classified elsewhere: Secondary | ICD-10-CM | POA: Diagnosis present

## 2019-10-21 DIAGNOSIS — T827XXA Infection and inflammatory reaction due to other cardiac and vascular devices, implants and grafts, initial encounter: Secondary | ICD-10-CM | POA: Diagnosis present

## 2019-10-21 DIAGNOSIS — L97519 Non-pressure chronic ulcer of other part of right foot with unspecified severity: Secondary | ICD-10-CM | POA: Diagnosis present

## 2019-10-21 DIAGNOSIS — Z20822 Contact with and (suspected) exposure to covid-19: Secondary | ICD-10-CM | POA: Diagnosis present

## 2019-10-21 DIAGNOSIS — N1832 Chronic kidney disease, stage 3b: Secondary | ICD-10-CM | POA: Diagnosis not present

## 2019-10-21 DIAGNOSIS — T82838A Hemorrhage of vascular prosthetic devices, implants and grafts, initial encounter: Secondary | ICD-10-CM | POA: Diagnosis not present

## 2019-10-21 DIAGNOSIS — R52 Pain, unspecified: Secondary | ICD-10-CM | POA: Diagnosis not present

## 2019-10-21 DIAGNOSIS — L02416 Cutaneous abscess of left lower limb: Secondary | ICD-10-CM | POA: Diagnosis present

## 2019-10-21 DIAGNOSIS — Z89422 Acquired absence of other left toe(s): Secondary | ICD-10-CM

## 2019-10-21 DIAGNOSIS — E669 Obesity, unspecified: Secondary | ICD-10-CM | POA: Diagnosis present

## 2019-10-21 DIAGNOSIS — Y832 Surgical operation with anastomosis, bypass or graft as the cause of abnormal reaction of the patient, or of later complication, without mention of misadventure at the time of the procedure: Secondary | ICD-10-CM | POA: Diagnosis present

## 2019-10-21 DIAGNOSIS — I878 Other specified disorders of veins: Secondary | ICD-10-CM | POA: Diagnosis present

## 2019-10-21 DIAGNOSIS — Z6830 Body mass index (BMI) 30.0-30.9, adult: Secondary | ICD-10-CM | POA: Diagnosis not present

## 2019-10-21 DIAGNOSIS — Y838 Other surgical procedures as the cause of abnormal reaction of the patient, or of later complication, without mention of misadventure at the time of the procedure: Secondary | ICD-10-CM | POA: Diagnosis not present

## 2019-10-21 DIAGNOSIS — M24562 Contracture, left knee: Secondary | ICD-10-CM | POA: Diagnosis present

## 2019-10-21 DIAGNOSIS — E1169 Type 2 diabetes mellitus with other specified complication: Secondary | ICD-10-CM | POA: Diagnosis present

## 2019-10-21 DIAGNOSIS — Z22322 Carrier or suspected carrier of Methicillin resistant Staphylococcus aureus: Secondary | ICD-10-CM | POA: Diagnosis not present

## 2019-10-21 DIAGNOSIS — D62 Acute posthemorrhagic anemia: Secondary | ICD-10-CM | POA: Diagnosis not present

## 2019-10-21 DIAGNOSIS — Z7901 Long term (current) use of anticoagulants: Secondary | ICD-10-CM | POA: Diagnosis not present

## 2019-10-21 DIAGNOSIS — T8149XA Infection following a procedure, other surgical site, initial encounter: Secondary | ICD-10-CM | POA: Diagnosis present

## 2019-10-21 HISTORY — DX: Anemia, unspecified: D64.9

## 2019-10-21 HISTORY — PX: DRAINAGE AND CLOSURE OF LYMPHOCELE: SHX5800

## 2019-10-21 LAB — GLUCOSE, CAPILLARY
Glucose-Capillary: 341 mg/dL — ABNORMAL HIGH (ref 70–99)
Glucose-Capillary: 299 mg/dL — ABNORMAL HIGH (ref 70–99)
Glucose-Capillary: 276 mg/dL — ABNORMAL HIGH (ref 70–99)
Glucose-Capillary: 166 mg/dL — ABNORMAL HIGH (ref 70–99)
Glucose-Capillary: 74 mg/dL (ref 70–99)
Glucose-Capillary: 75 mg/dL (ref 70–99)

## 2019-10-21 LAB — CBC
HCT: 28.4 % — ABNORMAL LOW (ref 39.0–52.0)
Hemoglobin: 9 g/dL — ABNORMAL LOW (ref 13.0–17.0)
MCH: 24.8 pg — ABNORMAL LOW (ref 26.0–34.0)
MCHC: 31.7 g/dL (ref 30.0–36.0)
MCV: 78.2 fL — ABNORMAL LOW (ref 80.0–100.0)
Platelets: 397 10*3/uL (ref 150–400)
RBC: 3.63 MIL/uL — ABNORMAL LOW (ref 4.22–5.81)
RDW: 16.9 % — ABNORMAL HIGH (ref 11.5–15.5)
WBC: 17.2 10*3/uL — ABNORMAL HIGH (ref 4.0–10.5)
nRBC: 0 % (ref 0.0–0.2)

## 2019-10-21 LAB — POCT I-STAT, CHEM 8
BUN: 30 mg/dL — ABNORMAL HIGH (ref 8–23)
Calcium, Ion: 1.15 mmol/L (ref 1.15–1.40)
Chloride: 92 mmol/L — ABNORMAL LOW (ref 98–111)
Creatinine, Ser: 2.1 mg/dL — ABNORMAL HIGH (ref 0.61–1.24)
Glucose, Bld: 301 mg/dL — ABNORMAL HIGH (ref 70–99)
HCT: 33 % — ABNORMAL LOW (ref 39.0–52.0)
Hemoglobin: 11.2 g/dL — ABNORMAL LOW (ref 13.0–17.0)
Potassium: 4.9 mmol/L (ref 3.5–5.1)
Sodium: 125 mmol/L — ABNORMAL LOW (ref 135–145)
TCO2: 24 mmol/L (ref 22–32)

## 2019-10-21 LAB — PROTIME-INR
INR: 1.3 — ABNORMAL HIGH (ref 0.8–1.2)
Prothrombin Time: 16.1 seconds — ABNORMAL HIGH (ref 11.4–15.2)

## 2019-10-21 LAB — RESPIRATORY PANEL BY RT PCR (FLU A&B, COVID)
Influenza A by PCR: NEGATIVE
Influenza B by PCR: NEGATIVE
SARS Coronavirus 2 by RT PCR: NEGATIVE

## 2019-10-21 LAB — HEMOGLOBIN A1C
Hgb A1c MFr Bld: 11.1 % — ABNORMAL HIGH (ref 4.8–5.6)
Mean Plasma Glucose: 271.87 mg/dL

## 2019-10-21 SURGERY — DRAINAGE AND CLOSURE OF LYMPHOCELE
Anesthesia: Monitor Anesthesia Care | Laterality: Left

## 2019-10-21 MED ORDER — LIDOCAINE HCL 1 % IJ SOLN
INTRAMUSCULAR | Status: DC | PRN
  Administered 2019-10-21: 10 mL

## 2019-10-21 MED ORDER — PIPERACILLIN-TAZOBACTAM 3.375 G IVPB 30 MIN
3.3750 g | Freq: Once | INTRAVENOUS | Status: AC
  Administered 2019-10-21: 3.375 g via INTRAVENOUS
  Filled 2019-10-21 (×2): qty 50

## 2019-10-21 MED ORDER — PROPOFOL 500 MG/50ML IV EMUL
INTRAVENOUS | Status: DC | PRN
  Administered 2019-10-21: 100 ug/kg/min via INTRAVENOUS

## 2019-10-21 MED ORDER — INSULIN ASPART 100 UNIT/ML ~~LOC~~ SOLN
0.0000 [IU] | SUBCUTANEOUS | Status: DC
  Administered 2019-10-21: 2 [IU] via SUBCUTANEOUS
  Filled 2019-10-21: qty 1

## 2019-10-21 MED ORDER — HYDRALAZINE HCL 20 MG/ML IJ SOLN: 5.0000 mg | INTRAMUSCULAR | Status: DC | PRN

## 2019-10-21 MED ORDER — TRIAMTERENE-HCTZ 37.5-25 MG PO TABS
1.0000 | ORAL_TABLET | Freq: Every day | ORAL | Status: DC
  Filled 2019-10-21: qty 1

## 2019-10-21 MED ORDER — PRAVASTATIN SODIUM 40 MG PO TABS
40.0000 mg | ORAL_TABLET | Freq: Every day | ORAL | Status: DC
  Administered 2019-10-22 – 2019-11-07 (×17): 40 mg via ORAL
  Filled 2019-10-21 (×17): qty 1

## 2019-10-21 MED ORDER — LABETALOL HCL 5 MG/ML IV SOLN: 10.0000 mg | INTRAVENOUS | Status: DC | PRN

## 2019-10-21 MED ORDER — ACETAMINOPHEN 325 MG PO TABS
325.0000 mg | ORAL_TABLET | ORAL | Status: DC | PRN
  Administered 2019-10-24 – 2019-11-06 (×6): 650 mg via ORAL
  Filled 2019-10-21 (×8): qty 2

## 2019-10-21 MED ORDER — POLYETHYLENE GLYCOL 3350 17 G PO PACK: 17.0000 g | PACK | Freq: Every day | ORAL | Status: DC | PRN

## 2019-10-21 MED ORDER — FAMOTIDINE 20 MG PO TABS
ORAL_TABLET | ORAL | Status: AC
  Administered 2019-10-21: 16:00:00 20 mg via ORAL
  Filled 2019-10-21: qty 1

## 2019-10-21 MED ORDER — SODIUM CHLORIDE 0.9 % IV SOLN: INTRAVENOUS | Status: DC

## 2019-10-21 MED ORDER — SODIUM CHLORIDE 0.9 % IV SOLN
INTRAVENOUS | Status: AC
  Filled 2019-10-21: qty 500000

## 2019-10-21 MED ORDER — CHLORHEXIDINE GLUCONATE 4 % EX LIQD: 60.0000 mL | Freq: Once | CUTANEOUS | Status: DC

## 2019-10-21 MED ORDER — INSULIN ASPART 100 UNIT/ML ~~LOC~~ SOLN
SUBCUTANEOUS | Status: AC
  Administered 2019-10-21: 12:00:00 7 [IU] via SUBCUTANEOUS
  Filled 2019-10-21: qty 1

## 2019-10-21 MED ORDER — ONDANSETRON HCL 4 MG/2ML IJ SOLN: 4.0000 mg | Freq: Once | INTRAMUSCULAR | Status: DC | PRN

## 2019-10-21 MED ORDER — ALBUMIN HUMAN 5 % IV SOLN
12.5000 g | Freq: Once | INTRAVENOUS | Status: AC
  Administered 2019-10-21: 12.5 g via INTRAVENOUS

## 2019-10-21 MED ORDER — EPHEDRINE 5 MG/ML INJ
10.0000 mg | Freq: Once | INTRAVENOUS | Status: AC
  Administered 2019-10-21: 10 mg via INTRAVENOUS

## 2019-10-21 MED ORDER — PREGABALIN 50 MG PO CAPS
50.0000 mg | ORAL_CAPSULE | Freq: Every day | ORAL | Status: DC
  Administered 2019-10-21 – 2019-11-06 (×17): 50 mg via ORAL
  Filled 2019-10-21 (×17): qty 1

## 2019-10-21 MED ORDER — MORPHINE SULFATE (PF) 2 MG/ML IV SOLN
2.0000 mg | INTRAVENOUS | Status: DC | PRN
  Administered 2019-10-23 – 2019-11-04 (×3): 2 mg via INTRAVENOUS
  Filled 2019-10-21 (×4): qty 1

## 2019-10-21 MED ORDER — 0.9 % SODIUM CHLORIDE (POUR BTL) OPTIME
TOPICAL | Status: DC | PRN
  Administered 2019-10-21: 1000 mL
  Administered 2019-10-21: 2000 mL
  Administered 2019-10-21: 1000 mL

## 2019-10-21 MED ORDER — METOPROLOL TARTRATE 5 MG/5ML IV SOLN: 2.0000 mg | INTRAVENOUS | Status: DC | PRN

## 2019-10-21 MED ORDER — CLOPIDOGREL BISULFATE 75 MG PO TABS
75.0000 mg | ORAL_TABLET | Freq: Every day | ORAL | Status: DC
  Administered 2019-10-22 – 2019-11-07 (×16): 75 mg via ORAL
  Filled 2019-10-21 (×16): qty 1

## 2019-10-21 MED ORDER — FERROUS SULFATE 325 (65 FE) MG PO TABS
325.0000 mg | ORAL_TABLET | Freq: Every day | ORAL | Status: DC
  Administered 2019-10-22 – 2019-11-07 (×16): 325 mg via ORAL
  Filled 2019-10-21 (×16): qty 1

## 2019-10-21 MED ORDER — ACETAMINOPHEN 325 MG RE SUPP
325.0000 mg | RECTAL | Status: DC | PRN
  Filled 2019-10-21: qty 2

## 2019-10-21 MED ORDER — ALBUMIN HUMAN 5 % IV SOLN
INTRAVENOUS | Status: AC
  Filled 2019-10-21: qty 250

## 2019-10-21 MED ORDER — PHENOL 1.4 % MT LIQD: 1.0000 | OROMUCOSAL | Status: DC | PRN

## 2019-10-21 MED ORDER — LIDOCAINE HCL (PF) 1 % IJ SOLN
INTRAMUSCULAR | Status: AC
  Filled 2019-10-21: qty 30

## 2019-10-21 MED ORDER — BISACODYL 10 MG RE SUPP: 10.0000 mg | Freq: Every day | RECTAL | Status: DC | PRN

## 2019-10-21 MED ORDER — INSULIN ASPART 100 UNIT/ML ~~LOC~~ SOLN: 7.0000 [IU] | Freq: Once | SUBCUTANEOUS | Status: AC

## 2019-10-21 MED ORDER — EPHEDRINE 5 MG/ML INJ
INTRAVENOUS | Status: AC
  Filled 2019-10-21: qty 10

## 2019-10-21 MED ORDER — ACETAMINOPHEN 10 MG/ML IV SOLN: 1000.0000 mg | Freq: Once | INTRAVENOUS | Status: DC | PRN

## 2019-10-21 MED ORDER — ALUM & MAG HYDROXIDE-SIMETH 200-200-20 MG/5ML PO SUSP: 15.0000 mL | ORAL | Status: DC | PRN

## 2019-10-21 MED ORDER — VANCOMYCIN HCL 1500 MG/300ML IV SOLN
1500.0000 mg | INTRAVENOUS | Status: DC
  Administered 2019-10-21: 1500 mg via INTRAVENOUS
  Filled 2019-10-21: qty 300

## 2019-10-21 MED ORDER — FENTANYL CITRATE (PF) 100 MCG/2ML IJ SOLN
25.0000 ug | INTRAMUSCULAR | Status: DC | PRN
  Administered 2019-10-21: 25 ug via INTRAVENOUS

## 2019-10-21 MED ORDER — OXYCODONE-ACETAMINOPHEN 5-325 MG PO TABS
1.0000 | ORAL_TABLET | ORAL | Status: DC | PRN
  Administered 2019-10-21: 22:00:00 2 via ORAL
  Administered 2019-10-22: 1 via ORAL
  Administered 2019-10-22 – 2019-10-23 (×3): 2 via ORAL
  Filled 2019-10-21 (×2): qty 2
  Filled 2019-10-21: qty 1
  Filled 2019-10-21: qty 2

## 2019-10-21 MED ORDER — SODIUM CHLORIDE 0.9 % IV SOLN
Freq: Once | INTRAVENOUS | Status: DC
  Filled 2019-10-21: qty 500000

## 2019-10-21 MED ORDER — SODIUM CHLORIDE 0.9 % IV SOLN
Freq: Once | INTRAVENOUS | Status: DC
  Filled 2019-10-21: qty 1

## 2019-10-21 MED ORDER — FAMOTIDINE 20 MG PO TABS: 20.0000 mg | ORAL_TABLET | Freq: Once | ORAL | Status: AC

## 2019-10-21 MED ORDER — CEFAZOLIN SODIUM-DEXTROSE 2-4 GM/100ML-% IV SOLN
2.0000 g | INTRAVENOUS | Status: AC
  Administered 2019-10-21: 2 g via INTRAVENOUS
  Filled 2019-10-21: qty 100

## 2019-10-21 MED ORDER — EPHEDRINE SULFATE 50 MG/ML IJ SOLN
INTRAMUSCULAR | Status: DC | PRN
  Administered 2019-10-21 (×2): 5 mg via INTRAVENOUS

## 2019-10-21 MED ORDER — ALLOPURINOL 100 MG PO TABS
100.0000 mg | ORAL_TABLET | Freq: Every day | ORAL | Status: DC
  Administered 2019-10-22 – 2019-11-07 (×17): 100 mg via ORAL
  Filled 2019-10-21 (×17): qty 1

## 2019-10-21 MED ORDER — POTASSIUM CHLORIDE CRYS ER 20 MEQ PO TBCR: 20.0000 meq | EXTENDED_RELEASE_TABLET | Freq: Once | ORAL | Status: DC

## 2019-10-21 MED ORDER — ONDANSETRON HCL 4 MG/2ML IJ SOLN: 4.0000 mg | Freq: Four times a day (QID) | INTRAMUSCULAR | Status: DC | PRN

## 2019-10-21 MED ORDER — GUAIFENESIN-DM 100-10 MG/5ML PO SYRP: 15.0000 mL | ORAL_SOLUTION | ORAL | Status: DC | PRN

## 2019-10-21 MED ORDER — PHENYLEPHRINE HCL-NACL 10-0.9 MG/250ML-% IV SOLN
INTRAVENOUS | Status: DC | PRN
  Administered 2019-10-21: 20 ug/min via INTRAVENOUS

## 2019-10-21 MED ORDER — LOSARTAN POTASSIUM 50 MG PO TABS
100.0000 mg | ORAL_TABLET | Freq: Every day | ORAL | Status: DC
  Filled 2019-10-21: qty 2

## 2019-10-21 MED ORDER — FENTANYL CITRATE (PF) 100 MCG/2ML IJ SOLN
INTRAMUSCULAR | Status: AC
  Filled 2019-10-21: qty 2

## 2019-10-21 MED ORDER — COLCHICINE 0.6 MG PO TABS
0.6000 mg | ORAL_TABLET | Freq: Two times a day (BID) | ORAL | Status: DC
  Administered 2019-10-21 – 2019-10-22 (×2): 0.6 mg via ORAL
  Filled 2019-10-21 (×2): qty 1

## 2019-10-21 MED ORDER — PIPERACILLIN-TAZOBACTAM 3.375 G IVPB
3.3750 g | Freq: Three times a day (TID) | INTRAVENOUS | Status: DC
  Administered 2019-10-22: 05:00:00 3.375 g via INTRAVENOUS
  Filled 2019-10-21: qty 50

## 2019-10-21 MED ORDER — FENTANYL 50 MCG/ML IV PCA SOLN
INTRAVENOUS | Status: AC
  Filled 2019-10-21: qty 20

## 2019-10-21 MED ORDER — INSULIN ASPART 100 UNIT/ML ~~LOC~~ SOLN
0.0000 [IU] | Freq: Three times a day (TID) | SUBCUTANEOUS | Status: DC
  Administered 2019-10-22: 18:00:00 3 [IU] via SUBCUTANEOUS
  Administered 2019-10-22: 0 [IU] via SUBCUTANEOUS
  Administered 2019-10-23: 5 [IU] via SUBCUTANEOUS
  Administered 2019-10-23: 18:00:00 15 [IU] via SUBCUTANEOUS
  Administered 2019-10-24: 3 [IU] via SUBCUTANEOUS

## 2019-10-21 MED ORDER — PANTOPRAZOLE SODIUM 40 MG PO TBEC
40.0000 mg | DELAYED_RELEASE_TABLET | Freq: Every day | ORAL | Status: DC
  Administered 2019-10-21 – 2019-11-06 (×17): 40 mg via ORAL
  Filled 2019-10-21 (×17): qty 1

## 2019-10-21 SURGICAL SUPPLY — 34 items
BNDG ELASTIC 6X5.8 VLCR STR LF (GAUZE/BANDAGES/DRESSINGS) ×2 IMPLANT
BNDG GAUZE ELAST 4 BULKY (GAUZE/BANDAGES/DRESSINGS) ×4 IMPLANT
CANISTER SUCT 3000ML PPV (MISCELLANEOUS) ×4 IMPLANT
COVER PROBE W GEL 5X96 (DRAPES) ×2 IMPLANT
COVER SURGICAL LIGHT HANDLE (MISCELLANEOUS) ×2 IMPLANT
DRSG PAD ABDOMINAL 8X10 ST (GAUZE/BANDAGES/DRESSINGS) ×4 IMPLANT
DRSG VAC ATS LRG SENSATRAC (GAUZE/BANDAGES/DRESSINGS) IMPLANT
DRSG VAC ATS MED SENSATRAC (GAUZE/BANDAGES/DRESSINGS) IMPLANT
ELECT REM PT RETURN 9FT ADLT (ELECTROSURGICAL) ×2
ELECTRODE REM PT RTRN 9FT ADLT (ELECTROSURGICAL) ×1 IMPLANT
GLOVE BIO SURGEON STRL SZ 6.5 (GLOVE) ×4 IMPLANT
GLOVE BIO SURGEON STRL SZ7.5 (GLOVE) ×4 IMPLANT
GLOVE BIOGEL PI IND STRL 7.5 (GLOVE) ×1 IMPLANT
GLOVE BIOGEL PI INDICATOR 7.5 (GLOVE) ×1
GLOVE ECLIPSE 6.0 STRL STRAW (GLOVE) ×2 IMPLANT
GOWN STRL REUS W/ TWL LRG LVL3 (GOWN DISPOSABLE) ×2 IMPLANT
GOWN STRL REUS W/ TWL XL LVL3 (GOWN DISPOSABLE) ×1 IMPLANT
GOWN STRL REUS W/TWL LRG LVL3 (GOWN DISPOSABLE) ×4
GOWN STRL REUS W/TWL XL LVL3 (GOWN DISPOSABLE) ×2
HANDPIECE INTERPULSE COAX TIP (DISPOSABLE)
KIT BASIN OR (CUSTOM PROCEDURE TRAY) ×2 IMPLANT
KIT TURNOVER KIT B (KITS) ×2 IMPLANT
NS IRRIG 1000ML POUR BTL (IV SOLUTION) ×6 IMPLANT
PACK GENERAL/GYN (CUSTOM PROCEDURE TRAY) ×2 IMPLANT
PACK UNIVERSAL I (CUSTOM PROCEDURE TRAY) IMPLANT
PAD ARMBOARD 7.5X6 YLW CONV (MISCELLANEOUS) ×4 IMPLANT
PAD NEG PRESSURE SENSATRAC (MISCELLANEOUS) ×2 IMPLANT
PENCIL BUTTON HOLSTER BLD 10FT (ELECTRODE) ×2 IMPLANT
SET HNDPC FAN SPRY TIP SCT (DISPOSABLE) IMPLANT
SWAB COLLECTION DEVICE MRSA (MISCELLANEOUS) ×2 IMPLANT
SWAB CULTURE ESWAB REG 1ML (MISCELLANEOUS) ×2 IMPLANT
SYR BULB IRRIGATION 50ML (SYRINGE) ×2 IMPLANT
TOWEL GREEN STERILE (TOWEL DISPOSABLE) ×2 IMPLANT
WATER STERILE IRR 1000ML POUR (IV SOLUTION) IMPLANT

## 2019-10-21 NOTE — Anesthesia Postprocedure Evaluation (Signed)
Anesthesia Post Note  Patient: [name]  Procedure(s) Performed: DRAINAGE OF LEFT LEG FLUID COLLECTION (Left )     Patient location during evaluation: PACU Anesthesia Type: MAC Level of consciousness: awake and alert Pain management: pain level controlled Vital Signs Assessment: post-procedure vital signs reviewed and stable Respiratory status: spontaneous breathing, nonlabored ventilation and respiratory function stable Cardiovascular status: stable and blood pressure returned to baseline Anesthetic complications: no    Last Vitals:  Vitals:   10/21/19 1945 10/21/19 2000  BP: (!) 101/57 110/68  Pulse: 75 78  Resp: (!) 24 15  Temp:    SpO2: 100% 100%    Last Pain:  Vitals:   10/21/19 1945  TempSrc:   PainSc: [name]

## 2019-10-21 NOTE — Progress Notes (Signed)
Dr. Roanna Banning is requesting sliding scale insulin to be given per patient's blood sugar.

## 2019-10-21 NOTE — Progress Notes (Signed)
Pharmacy Antibiotic Note  [name] is a 69 y.o. male admitted on 10/21/2019 with thigh abscess.  Pharmacy has been consulted for vanc/zosyn dosing.  69 yo with a hx of left SFA bypass with complicated infection. He is s/p re-exploration of above knee popliteal space and drainage of abscess. Cultures were sent. Vanc and zosyn were ordered post op. Due to his CRI, cefepime might be a better combo to avoid nephrotoxicity.   Scr 2.1 CrCl 44 ml/min Vanc trough 10-15  Plan: Vanc 1.5g IV q24 Zosyn 3.375g IV q8 Level as needed F/u with cultures  Height: 6\' 2"  (188 cm) Weight: 108 kg (238 lb) IBW/kg (Calculated) : 82.2  Temp (24hrs), Avg:98.4 F (36.9 C), Min:98.1 F (36.7 C), Max:99 F (37.2 C)  Recent Labs  Lab 10/19/19 1755 10/21/19 1200 10/21/19 1950  WBC  --   --  17.2*  CREATININE 2.00* 2.10*  --     Estimated Creatinine Clearance: 44 mL/min (A) (by C-G formula based on SCr of 2.1 mg/dL (H)).    No Known Allergies  Antimicrobials this admission: 4/28 vanc>> 4/28 zosyn>>  Dose adjustments this admission:   Microbiology results: 4/28 wound>> 4/28 AFB>>  [name], PharmD, [place], AAHIVP, CPP Infectious Disease Pharmacist 10/21/2019 9:02 PM

## 2019-10-21 NOTE — Progress Notes (Signed)
Patient hypotensive upon arrival to PACU, BP 77/40 right arm and 69/54 left arm.  HR sinus 80's.  One 212ml albumin given by CRNA in PACU.  Dr. Fransisco Beau notified and ordered another 221ml albumin and 10mg  IV ephidrine.  Will administer and continue to monitor.

## 2019-10-21 NOTE — Op Note (Signed)
    Patient name: EGBERT SEIDEL MRN: [id number] DOB: Nov 02, 1950 Sex: male  10/21/2019 Pre-operative Diagnosis: Left thigh fluid collection Post-operative diagnosis: Left thigh abscess Surgeon:  Erlene Quan C. Donzetta Matters, MD Assistant: Leontine Locket, PA Procedure Performed: Reexploration of left above-knee popliteal space with drainage of abscess  Indications: 69 year old male has undergone left SFA to anterior tibial artery bypass.  This was complicated by subsequent infection which had healed.  He now has a few week history of acute onset swelling left thigh has undergone CT scan which demonstrated fluid collection concerning for recurrent infection.  Findings: Ultrasound was used to identify the fluid collection.  We reentered the above-knee popliteal space drained approximately 500 cc of purulence cultures were sent.  The wound was packed with Betadine soaked Kerlix.  The bypass was patent within the wound.   Procedure:  The patient was identified in the holding area and taken to the operating room where is placed supine operative table and MAC anesthesia induced.  He was sterilely prepped draped in left lower extremity in the usual fashion antibiotics were administered and timeout was called.  The above-knee space was anesthetized.  Ultrasound was used to identify the fluid collection we could see the bypass within the fluid collection.  We used 10 blade to open the previous scar dissected down with cautery.  Again identified the fluid collection and entered this sharply and encountered significant purulence.  There was a large cavity we drained approximately 500 cc of purulence.  Cultures were sent with a aerobic and anaerobic.  We thoroughly irrigated the wound with 3 L of saline.  The bypass was noted to be patent.  We placed Betadine soaked Kerlix and a dry dressing.  He was awakened anesthesia having tolerated procedure without immediate complication.  All counts were correct at completion.  EBL: 200  cc.  Specimen: Aerobic anaerobic culture   Marrianne Sica C. Donzetta Matters, MD [facility] Office: [phone] Pager: [phone]

## 2019-10-21 NOTE — Progress Notes (Signed)
Patient's wife contacted and updated on patient status and room assignment.

## 2019-10-21 NOTE — H&P (Signed)
   History and Physical Update  The patient was interviewed and re-examined.  The patient's previous History and Physical has been reviewed and is unchanged from recent office visit.  I discussed the CT findings with him we will plan for drainage of left thigh fluid collection will likely need to leave the wound open to admit the patient at least for observation overnight.  Lahela Woodin C. Donzetta Matters, MD [facility] Office: [phone] Pager: [phone]  10/21/2019, 5:20 PM

## 2019-10-21 NOTE — Transfer of Care (Signed)
Immediate Anesthesia Transfer of Care Note  Patient: [name]  Procedure(s) Performed: DRAINAGE OF LEFT LEG FLUID COLLECTION (Left )  Patient Location: PACU  Anesthesia Type:MAC  Level of Consciousness: drowsy and patient cooperative  Airway & Oxygen Therapy: Patient Spontanous Breathing and Patient connected to face mask oxygen  Post-op Assessment: Report given to RN  Post vital signs: Reviewed and stable  Last Vitals:  Vitals Value Taken Time  BP 80/54 10/21/19 1919  Temp    Pulse 81 10/21/19 1922  Resp 30 10/21/19 1922  SpO2 100 % 10/21/19 1922  Vitals shown include unvalidated device data.  Last Pain:  Vitals:   10/21/19 1144  TempSrc:   PainSc: 6       Patients Stated Pain Goal: 2 (07/25/41 8887)  Complications: No apparent anesthesia complications  BP soft.  Albumin 272m started. MDA notified

## 2019-10-21 NOTE — Anesthesia Preprocedure Evaluation (Addendum)
Anesthesia Evaluation  Patient identified by MRN, date of birth, ID band Patient awake    Reviewed: Allergy & Precautions, NPO status , Patient's Chart, lab work & pertinent test results  Airway Mallampati: III  TM Distance: >3 FB Neck ROM: Full    Dental  (+) Poor Dentition, Loose,    Pulmonary sleep apnea and Continuous Positive Airway Pressure Ventilation , former smoker,    Pulmonary exam normal breath sounds clear to auscultation       Cardiovascular hypertension, Normal cardiovascular exam Rhythm:Regular Rate:Normal     Neuro/Psych PSYCHIATRIC DISORDERS Depression Diabetic peripheral neuropathy   Neuromuscular disease    GI/Hepatic negative GI ROS, Neg liver ROS,   Endo/Other  diabetes, Insulin Dependentuses insulin pump  Renal/GU CRFRenal disease     Musculoskeletal Gout   Abdominal (+) + obese,   Peds  Hematology  (+) anemia , HLD   Anesthesia Other Findings CRITICAL LIMB ISCHEMIA  Reproductive/Obstetrics                            Anesthesia Physical Anesthesia Plan  ASA: III  Anesthesia Plan: MAC   Post-op Pain Management:    Induction: Intravenous  PONV Risk Score and Plan: 1 and Ondansetron, Treatment may vary due to age or medical condition and Propofol infusion  Airway Management Planned: Simple Face Mask  Additional Equipment:   Intra-op Plan:   Post-operative Plan:   Informed Consent: I have reviewed the patients History and Physical, chart, labs and discussed the procedure including the risks, benefits and alternatives for the proposed anesthesia with the patient or authorized representative who has indicated his/her understanding and acceptance.     Dental advisory given  Plan Discussed with: CRNA  Anesthesia Plan Comments:        Anesthesia Quick Evaluation

## 2019-10-22 DIAGNOSIS — I878 Other specified disorders of veins: Secondary | ICD-10-CM

## 2019-10-22 DIAGNOSIS — Z87891 Personal history of nicotine dependence: Secondary | ICD-10-CM

## 2019-10-22 DIAGNOSIS — T827XXA Infection and inflammatory reaction due to other cardiac and vascular devices, implants and grafts, initial encounter: Principal | ICD-10-CM

## 2019-10-22 DIAGNOSIS — Z7901 Long term (current) use of anticoagulants: Secondary | ICD-10-CM

## 2019-10-22 DIAGNOSIS — Z8619 Personal history of other infectious and parasitic diseases: Secondary | ICD-10-CM

## 2019-10-22 DIAGNOSIS — Z89422 Acquired absence of other left toe(s): Secondary | ICD-10-CM | POA: Diagnosis not present

## 2019-10-22 DIAGNOSIS — Z22322 Carrier or suspected carrier of Methicillin resistant Staphylococcus aureus: Secondary | ICD-10-CM | POA: Diagnosis not present

## 2019-10-22 LAB — CBC
HCT: 22 % — ABNORMAL LOW (ref 39.0–52.0)
Hemoglobin: 7.2 g/dL — ABNORMAL LOW (ref 13.0–17.0)
MCH: 25 pg — ABNORMAL LOW (ref 26.0–34.0)
MCHC: 32.7 g/dL (ref 30.0–36.0)
MCV: 76.4 fL — ABNORMAL LOW (ref 80.0–100.0)
Platelets: 345 10*3/uL (ref 150–400)
RBC: 2.88 MIL/uL — ABNORMAL LOW (ref 4.22–5.81)
RDW: 16.6 % — ABNORMAL HIGH (ref 11.5–15.5)
WBC: 13 10*3/uL — ABNORMAL HIGH (ref 4.0–10.5)
nRBC: 0 % (ref 0.0–0.2)

## 2019-10-22 LAB — HEMOGLOBIN A1C
Hgb A1c MFr Bld: 11.3 % — ABNORMAL HIGH (ref 4.8–5.6)
Mean Plasma Glucose: 277.61 mg/dL

## 2019-10-22 LAB — BASIC METABOLIC PANEL
Anion gap: 12 (ref 5–15)
BUN: 34 mg/dL — ABNORMAL HIGH (ref 8–23)
CO2: 20 mmol/L — ABNORMAL LOW (ref 22–32)
Calcium: 8.4 mg/dL — ABNORMAL LOW (ref 8.9–10.3)
Chloride: 95 mmol/L — ABNORMAL LOW (ref 98–111)
Creatinine, Ser: 2.41 mg/dL — ABNORMAL HIGH (ref 0.61–1.24)
GFR calc Af Amer: 31 mL/min — ABNORMAL LOW (ref >60)
GFR calc non Af Amer: 27 mL/min — ABNORMAL LOW (ref >60)
Glucose, Bld: 204 mg/dL — ABNORMAL HIGH (ref 70–99)
Potassium: 4 mmol/L (ref 3.5–5.1)
Sodium: 127 mmol/L — ABNORMAL LOW (ref 135–145)

## 2019-10-22 LAB — GLUCOSE, CAPILLARY
Glucose-Capillary: 108 mg/dL — ABNORMAL HIGH (ref 70–99)
Glucose-Capillary: 109 mg/dL — ABNORMAL HIGH (ref 70–99)
Glucose-Capillary: 139 mg/dL — ABNORMAL HIGH (ref 70–99)
Glucose-Capillary: 193 mg/dL — ABNORMAL HIGH (ref 70–99)
Glucose-Capillary: 196 mg/dL — ABNORMAL HIGH (ref 70–99)

## 2019-10-22 LAB — HIV ANTIBODY (ROUTINE TESTING W REFLEX): HIV Screen 4th Generation wRfx: NONREACTIVE

## 2019-10-22 MED ORDER — VANCOMYCIN HCL IN DEXTROSE 1-5 GM/200ML-% IV SOLN
1000.0000 mg | INTRAVENOUS | Status: DC
  Administered 2019-10-22 – 2019-10-23 (×2): 1000 mg via INTRAVENOUS
  Filled 2019-10-22 (×3): qty 200

## 2019-10-22 MED ORDER — INSULIN PUMP
Freq: Three times a day (TID) | SUBCUTANEOUS | Status: DC
  Administered 2019-10-22: 4 via SUBCUTANEOUS
  Administered 2019-10-22: 2 via SUBCUTANEOUS
  Administered 2019-10-22: 1 via SUBCUTANEOUS
  Administered 2019-10-23: 20 via SUBCUTANEOUS
  Administered 2019-10-23: 1 via SUBCUTANEOUS
  Administered 2019-10-24: 10 via SUBCUTANEOUS
  Administered 2019-10-24: 30 via SUBCUTANEOUS
  Administered 2019-10-24: 20 via SUBCUTANEOUS
  Administered 2019-10-24 – 2019-10-25 (×2): 10 via SUBCUTANEOUS
  Administered 2019-10-26 – 2019-10-30 (×2): 4 via SUBCUTANEOUS
  Administered 2019-10-30: 10 via SUBCUTANEOUS
  Administered 2019-10-31: 6 via SUBCUTANEOUS
  Administered 2019-10-31 (×2): 8 via SUBCUTANEOUS
  Administered 2019-11-01: 10 via SUBCUTANEOUS
  Administered 2019-11-04 (×2): 1 via SUBCUTANEOUS
  Filled 2019-10-22: qty 1

## 2019-10-22 MED ORDER — INSULIN PUMP
Freq: Three times a day (TID) | SUBCUTANEOUS | Status: DC
  Filled 2019-10-22: qty 1

## 2019-10-22 MED ORDER — RIVAROXABAN 10 MG PO TABS
10.0000 mg | ORAL_TABLET | Freq: Every day | ORAL | Status: DC
  Administered 2019-10-22: 10 mg via ORAL
  Filled 2019-10-22: qty 1

## 2019-10-22 NOTE — Progress Notes (Addendum)
Pharmacy Antibiotic Note  [name] is a 69 y.o. male admitted on 10/21/2019 with thigh abscess.  Pharmacy has been consulted for vanc/zosyn dosing.  71 YOM with a history of left SFA bypass with complicated infection. He is s/p re-exploration of above knee popliteal space and abscess drainage of ~0.5L purulent fluid that has been cultured. Vanc and zosyn were ordered post op.   Patient's creatinine  has increased from 2.10 to 2.41. Adjusting vancomycin base dosed on new creatinine. Patient's BMI also borderline at 30.5; will continue to monitor and adjust dose accordingly.   Vancomycin 1000 mg IV Q 24 hrs. Goal AUC 400-550. BMI: 30.5 Vd: 0.5 L/kg Expected AUC: 566 SCr used: 2.41  Plan: - Reduce vancomycin dose to 1g IV every 24 hours - Continue Zosyn 3.375g IV q8 - Follow-up cultures - Vanc level as needed - Follow-up ID consult - Consider switching Zosyn to cefepime or ceftriaxone in setting of AKI.  Height: 6\' 2"  (188 cm) Weight: 108 kg (238 lb) IBW/kg (Calculated) : 82.2  Temp (24hrs), Avg:98.4 F (36.9 C), Min:98.1 F (36.7 C), Max:99 F (37.2 C)  Recent Labs  Lab 10/19/19 1755 10/21/19 1200 10/21/19 1950 10/22/19 0602  WBC  --   --  17.2* 13.0*  CREATININE 2.00* 2.10*  --  2.41*    Estimated Creatinine Clearance: 38.4 mL/min (A) (by C-G formula based on SCr of 2.41 mg/dL (H)).    No Known Allergies  Antimicrobials this admission: 4/28 vanc>> 4/28 zosyn>>  Dose adjustments this admission:   Microbiology results: 4/28 wound: GPCs, pending 4/28 AFB: pending     Angellynn Kimberlin L. Devin Going, [place] PGY1 Pharmacy Resident [phone] 10/22/19      9:51 AM  Please check AMION for all [place] phone numbers After 10:00 PM, call the [place] [phone]

## 2019-10-22 NOTE — TOC Initial Note (Signed)
Transition of Care (TOC) - Initial/Assessment Note  [name] RN, BSN Transitions of Care Unit 4E- RN Case Manager [phone]   Patient Details  Name: [name] MRN: [id number] Date of Birth: 05/20/1951  Transition of Care [facility]) CM/SW Contact:    Dawayne Patricia, RN Phone Number: 10/22/2019, 3:41 PM  Clinical Narrative:                 Pt admitted s/p I&D, may need LT IV abx- ID consult pending- have spoken with Pam at [facility] who will follow for home IV abx needs. CM spoke with pt at bedside regarding potential transition of care needs- Per Vascular PA pt will need HHRN for both home IV abx and wound care needs- per pt he has used HH in past - [facility]- but is open to using another agency if needed. Pt lives at home wife- discussed home IV abx- and pt feels like wife will be able to assist with this.  Call made to [facility] with [facility]- they are tight with their nursing and are unable to staff until middle of next week. Call made to [facility] with [facility]- who is able to accept referral with a possible d/c this weekend- will /fu tomorrow for potential d/c plan.  Pt will still need PICC line placed.    Expected Discharge Plan: [place] Barriers to Discharge: Continued Medical Work up   Patient Goals and CMS Choice Patient states their goals for this hospitalization and ongoing recovery are:: return home, healing of wound CMS Medicare.gov Compare Post Acute Care list provided to:: Patient Choice offered to / list presented to : Patient  Expected Discharge Plan and Services Expected Discharge Plan: [place]   Discharge Planning Services: CM Consult Post Acute Care Choice: [place] arrangements for the past 2 months: Single Family Home                           HH Arranged: RN, IV Antibiotics HH Agency: [place] Date [facility] Agency Contacted: 10/22/19 Time HH Agency  Contacted: 1330 Representative spoke with at [place]: Tommi Rumps  Prior Living Arrangements/Services Living arrangements for the past 2 months: [place] with:: Spouse Patient language and need for interpreter reviewed:: Yes Do you feel safe going back to the place where you live?: Yes      Need for Family Participation in Patient Care: Yes (Comment) Care giver support system in place?: Yes (comment) Current home services: DME Criminal Activity/Legal Involvement Pertinent to Current Situation/Hospitalization: No - Comment as needed  Activities of Daily Living Home Assistive Devices/Equipment: Environmental consultant (specify type), Wheelchair, CBG Meter, Cane (specify quad or straight), Eyeglasses, Blood pressure cuff ADL Screening (condition at time of admission) Patient's cognitive ability adequate to safely complete daily activities?: Yes Is the patient deaf or have difficulty hearing?: No Does the patient have difficulty seeing, even when wearing glasses/contacts?: No Does the patient have difficulty concentrating, remembering, or making decisions?: No Patient able to express need for assistance with ADLs?: Yes Does the patient have difficulty dressing or bathing?: No Independently performs ADLs?: Yes (appropriate for developmental age) Does the patient have difficulty walking or climbing stairs?: Yes Weakness of Legs: Left Weakness of Arms/Hands: None  Permission Sought/Granted Permission sought to share information with : Facility Art therapist granted to share information with : Yes, Verbal Permission Granted  Permission granted to share info w AGENCY: [place] agencies        Emotional Assessment Appearance:: Appears stated age Attitude/Demeanor/Rapport: Engaged Affect (typically observed): Appropriate, Pleasant Orientation: : Oriented to Self, Oriented to Place, Oriented to  Time, Oriented to Situation Alcohol / Substance Use: Not Applicable Psych  Involvement: No (comment)  Admission diagnosis:  Ischemic leg [I99.8] Wound infection after surgery [T81.49XA] Patient Active Problem List   Diagnosis Date Noted  . Ischemic leg 10/21/2019  . Wound infection after surgery 10/21/2019  . Type 2 diabetes mellitus with hyperglycemia, with long-term current use of insulin ([place])   . Thrombocytosis ([place])   . Chronic pain of left knee   . Primary osteoarthritis of right knee   . Effusion of lower leg joint   . Moderate episode of recurrent major depressive disorder ([place])   . Hypoalbuminemia due to protein-calorie malnutrition ([place])   . Debility 12/26/2018  . Acute on chronic renal failure ([place]) 12/19/2018  . Severe sepsis ([place]) 12/19/2018  . Hyponatremia 12/19/2018  . History of DVT (deep vein thrombosis) 12/19/2018  . Non-healing surgical wound 09/11/2018  . CKD (chronic kidney disease) stage 3, GFR 30-59 ml/min 06/28/2018  . PVD (peripheral vascular disease) ([place]) 06/28/2018  . Diabetic foot ulcer ([place]) 06/27/2018  . Anemia 06/27/2018  . Benign essential HTN   . Diabetes mellitus type 2 in obese ([place])   . Acute blood loss anemia   . Tachypnea   . Leukocytosis   . Critical limb ischemia with history of revascularization of same extremity 04/28/2018  . Critical lower limb ischemia 04/28/2018  . Claudication in peripheral vascular disease ([place]) 08/04/2017  . IDDM (insulin dependent diabetes mellitus) 12/02/2012  . HTN (hypertension) 12/02/2012   PCP:  [name], MD Pharmacy:   [facility] [address] [place] [address] Phone: [phone] Fax: [phone]     Social Determinants of Health (SDOH) Interventions    Readmission Risk Interventions Readmission Risk Prevention Plan 05/09/2018 05/09/2018  Transportation Screening - Complete  PCP or Specialist Appt within 5-7 Days Complete -  Home Care Screening Complete -   Medication Review (RN CM) Complete -  Some recent data might be hidden

## 2019-10-22 NOTE — Progress Notes (Addendum)
Pt transferred from PACU to 4E18, appeared alert and oriented x 4. [name] at bedside, update given before she left.CHG bath given, call bell with in reach.    Pt vital sign stable. CCMD called with 2nd person verified. EKG sinus rhythm on monitor, HR 85, BP 129/61  mmHg, RR 23, SPO2 98% on room air, Temp 98.4 F. No acute distress note on arrival.  Pt has his personal insulin pump attached on right upper quadrant on abdomen. Insulin pump contract agreement signed and put in Pt's chart.   Left leg dressing intact with minimal drainage, pain tolerated well. Percocet given PRN. We able to detect DP and PT pulses bilaterally with doppler. Will continue to monitor.  Kennyth Lose, RN

## 2019-10-22 NOTE — Progress Notes (Addendum)
[facility] [place]  Subjective  - No new complaints, soreness at medial thigh incision.   Objective (!) 96/58 71 98.3 F (36.8 C) (Oral) 17 94%  Intake/Output Summary (Last 24 hours) at 10/22/2019 0904 Last data filed at 10/22/2019 0525 Gross per 24 hour  Intake 1616.7 ml  Output 500 ml  Net 1116.7 ml    Left lateral foot superficial wound with de roofed scab.  Foot appears viable. Left Knee contracture known. Doppler AT/DP brisk Medial thigh wound packed with NS 1/2 kerlex guaze, ABD x @ and ace wrap. Lungs non labored breathing  Assessment/Planning: POD #I & D left medical thigh with vein bypass visible, purulent filled cavity  Wet to dry NS guaze daily Intraoperative cultures pending Afebrile with WBC leukocytosis improved post op from 17 to 13.  Current culture show gram positive.  Previous Lower leg purulent I & d procedure 12/23/18.   MODERATE GRAM POSITIVE COCCI    Culture ABUNDANT GROUP B STREP(S.AGALACTIAE)ISOLATED     Asymptomatic anemia 7.2 operative EBL 150 ml likely dilusional Cont Zosyn and vanc IV. Will consult ID  [name] 10/22/2019 9:04 AM --  Laboratory Lab Results: Recent Labs    10/21/19 1950 10/22/19 0602  WBC 17.2* 13.0*  HGB 9.0* 7.2*  HCT 28.4* 22.0*  PLT 397 345   BMET Recent Labs    10/21/19 1200 10/22/19 0602  NA 125* 127*  K 4.9 4.0  CL 92* 95*  CO2  --  20*  GLUCOSE 301* 204*  BUN 30* 34*  CREATININE 2.10* 2.41*  CALCIUM  --  8.4*    COAG Lab Results  Component Value Date   INR 1.3 (H) 10/21/2019   INR 0.93 12/01/2012   No results found for: PTT    I have interviewed and examined patient with PA and agree with assessment and plan above.   Harlen Danford C. Donzetta Matters, MD [facility] [place] Office: [phone] Pager: [phone]

## 2019-10-22 NOTE — Progress Notes (Signed)
Inpatient Diabetes Program Recommendations  AACE/ADA: New Consensus Statement on Inpatient Glycemic Control (2015)  Target Ranges:  Prepandial:   less than 140 mg/dL      Peak postprandial:   less than 180 mg/dL (1-2 hours)      Critically ill patients:  140 - 180 mg/dL   Lab Results  Component Value Date   GLUCAP 193 (H) 10/22/2019   HGBA1C 11.3 (H) 10/22/2019    Review of Glycemic Control  Results for MAVRIC, CORTRIGHT (MRN [id number]) as of 10/22/2019 14:46  Ref. Range 10/21/2019 11:00 10/21/2019 12:29 10/21/2019 13:25 10/21/2019 15:59 10/21/2019 19:20 10/21/2019 19:25 10/22/2019 00:10 10/22/2019 06:39  Glucose-Capillary Latest Ref Range: 70 - 99 mg/dL 341 (H) 299 (H) 276 (H) 166 (H) 74 75 196 (H) 193 (H)   Diabetes history: DM 2 Outpatient Diabetes medications:  V-Go 30- patient takes 5 clicks with each meal Current orders for Inpatient glycemic control:  Novolog moderate tid with meals V-Go changed this morning at 0800 (changed every 24 hours)  Inpatient Diabetes Program Recommendations:    Please d/c Novolog correction. Patient is covering blood sugars with Novolog in V-Go.   Discussed with patient.  He states that he changed V-Go this morning.   Thanks,  Adah Perl, RN, BC-ADM Inpatient Diabetes Coordinator Pager [phone] (8a-5p)
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
## 2019-10-23 ENCOUNTER — Inpatient Hospital Stay (HOSPITAL_COMMUNITY): Payer: BC Managed Care – PPO | Admitting: Anesthesiology

## 2019-10-23 ENCOUNTER — Encounter (HOSPITAL_COMMUNITY)
Admission: RE | Disposition: A | Discharge status: Skilled Nursing Facility | Payer: Self-pay | Source: Home / Self Care | Attending: Vascular Surgery

## 2019-10-23 DIAGNOSIS — R58 Hemorrhage, not elsewhere classified: Secondary | ICD-10-CM

## 2019-10-23 DIAGNOSIS — I998 Other disorder of circulatory system: Secondary | ICD-10-CM

## 2019-10-23 DIAGNOSIS — E669 Obesity, unspecified: Secondary | ICD-10-CM

## 2019-10-23 DIAGNOSIS — A4901 Methicillin susceptible Staphylococcus aureus infection, unspecified site: Secondary | ICD-10-CM

## 2019-10-23 DIAGNOSIS — N1832 Chronic kidney disease, stage 3b: Secondary | ICD-10-CM

## 2019-10-23 DIAGNOSIS — T82838A Hemorrhage of vascular prosthetic devices, implants and grafts, initial encounter: Secondary | ICD-10-CM

## 2019-10-23 DIAGNOSIS — E1169 Type 2 diabetes mellitus with other specified complication: Secondary | ICD-10-CM

## 2019-10-23 DIAGNOSIS — T8149XA Infection following a procedure, other surgical site, initial encounter: Secondary | ICD-10-CM

## 2019-10-23 HISTORY — PX: FEMORAL-POPLITEAL BYPASS GRAFT: SHX937

## 2019-10-23 LAB — BASIC METABOLIC PANEL
Anion gap: 8 (ref 5–15)
BUN: 27 mg/dL — ABNORMAL HIGH (ref 8–23)
CO2: 20 mmol/L — ABNORMAL LOW (ref 22–32)
Calcium: 7.6 mg/dL — ABNORMAL LOW (ref 8.9–10.3)
Chloride: 102 mmol/L (ref 98–111)
Creatinine, Ser: 1.72 mg/dL — ABNORMAL HIGH (ref 0.61–1.24)
GFR calc Af Amer: 46 mL/min — ABNORMAL LOW (ref >60)
GFR calc non Af Amer: 40 mL/min — ABNORMAL LOW (ref >60)
Glucose, Bld: 184 mg/dL — ABNORMAL HIGH (ref 70–99)
Potassium: 4 mmol/L (ref 3.5–5.1)
Sodium: 130 mmol/L — ABNORMAL LOW (ref 135–145)

## 2019-10-23 LAB — POCT I-STAT 7, (LYTES, BLD GAS, ICA,H+H)
Acid-base deficit: 6 mmol/L — ABNORMAL HIGH (ref 0.0–2.0)
Bicarbonate: 19.4 mmol/L — ABNORMAL LOW (ref 20.0–28.0)
Calcium, Ion: 1.14 mmol/L — ABNORMAL LOW (ref 1.15–1.40)
HCT: 18 % — ABNORMAL LOW (ref 39.0–52.0)
Hemoglobin: 6.1 g/dL — CL (ref 13.0–17.0)
O2 Saturation: 100 %
Potassium: 5.4 mmol/L — ABNORMAL HIGH (ref 3.5–5.1)
Sodium: 131 mmol/L — ABNORMAL LOW (ref 135–145)
TCO2: 20 mmol/L — ABNORMAL LOW (ref 22–32)
pCO2 arterial: 36.5 mmHg (ref 32.0–48.0)
pH, Arterial: 7.334 — ABNORMAL LOW (ref 7.350–7.450)
pO2, Arterial: 402 mmHg — ABNORMAL HIGH (ref 83.0–108.0)

## 2019-10-23 LAB — CBC
HCT: 17.7 % — ABNORMAL LOW (ref 39.0–52.0)
Hemoglobin: 5.7 g/dL — CL (ref 13.0–17.0)
MCH: 24.5 pg — ABNORMAL LOW (ref 26.0–34.0)
MCHC: 32.2 g/dL (ref 30.0–36.0)
MCV: 76 fL — ABNORMAL LOW (ref 80.0–100.0)
Platelets: 349 10*3/uL (ref 150–400)
RBC: 2.33 MIL/uL — ABNORMAL LOW (ref 4.22–5.81)
RDW: 16.8 % — ABNORMAL HIGH (ref 11.5–15.5)
WBC: 18.2 10*3/uL — ABNORMAL HIGH (ref 4.0–10.5)
nRBC: 0 % (ref 0.0–0.2)

## 2019-10-23 LAB — PROTIME-INR
INR: 3 — ABNORMAL HIGH (ref 0.8–1.2)
INR: 1.9 — ABNORMAL HIGH (ref 0.8–1.2)
Prothrombin Time: 29.8 seconds — ABNORMAL HIGH (ref 11.4–15.2)
Prothrombin Time: 20.8 seconds — ABNORMAL HIGH (ref 11.4–15.2)

## 2019-10-23 LAB — PREPARE RBC (CROSSMATCH)

## 2019-10-23 LAB — POCT I-STAT, CHEM 8
BUN: 23 mg/dL (ref 8–23)
Calcium, Ion: 1.03 mmol/L — ABNORMAL LOW (ref 1.15–1.40)
Chloride: 104 mmol/L (ref 98–111)
Creatinine, Ser: 1.6 mg/dL — ABNORMAL HIGH (ref 0.61–1.24)
Glucose, Bld: 258 mg/dL — ABNORMAL HIGH (ref 70–99)
HCT: 20 % — ABNORMAL LOW (ref 39.0–52.0)
Hemoglobin: 6.8 g/dL — CL (ref 13.0–17.0)
Potassium: 5.1 mmol/L (ref 3.5–5.1)
Sodium: 131 mmol/L — ABNORMAL LOW (ref 135–145)
TCO2: 18 mmol/L — ABNORMAL LOW (ref 22–32)

## 2019-10-23 LAB — HEMOGLOBIN AND HEMATOCRIT, BLOOD
HCT: 24.4 % — ABNORMAL LOW (ref 39.0–52.0)
Hemoglobin: 8.4 g/dL — ABNORMAL LOW (ref 13.0–17.0)

## 2019-10-23 LAB — GLUCOSE, CAPILLARY
Glucose-Capillary: 148 mg/dL — ABNORMAL HIGH (ref 70–99)
Glucose-Capillary: 61 mg/dL — ABNORMAL LOW (ref 70–99)
Glucose-Capillary: 230 mg/dL — ABNORMAL HIGH (ref 70–99)
Glucose-Capillary: 227 mg/dL — ABNORMAL HIGH (ref 70–99)
Glucose-Capillary: 374 mg/dL — ABNORMAL HIGH (ref 70–99)
Glucose-Capillary: 404 mg/dL — ABNORMAL HIGH (ref 70–99)

## 2019-10-23 LAB — APTT: aPTT: 54 seconds — ABNORMAL HIGH (ref 24–36)

## 2019-10-23 SURGERY — BYPASS GRAFT FEMORAL-POPLITEAL ARTERY
Anesthesia: General | Site: Leg Upper | Laterality: Left

## 2019-10-23 MED ORDER — DEXTROSE 50 % IV SOLN: 12.5000 g | INTRAVENOUS | Status: AC

## 2019-10-23 MED ORDER — ONDANSETRON HCL 4 MG/2ML IJ SOLN
INTRAMUSCULAR | Status: DC | PRN
  Administered 2019-10-23: 4 mg via INTRAVENOUS

## 2019-10-23 MED ORDER — SODIUM CHLORIDE 0.9 % IV SOLN: INTRAVENOUS | Status: DC

## 2019-10-23 MED ORDER — DEXAMETHASONE SODIUM PHOSPHATE 10 MG/ML IJ SOLN
INTRAMUSCULAR | Status: AC
  Filled 2019-10-23: qty 1

## 2019-10-23 MED ORDER — SUCCINYLCHOLINE CHLORIDE 200 MG/10ML IV SOSY
PREFILLED_SYRINGE | INTRAVENOUS | Status: AC
  Filled 2019-10-23: qty 10

## 2019-10-23 MED ORDER — ROCURONIUM BROMIDE 10 MG/ML (PF) SYRINGE
PREFILLED_SYRINGE | INTRAVENOUS | Status: AC
  Filled 2019-10-23: qty 10

## 2019-10-23 MED ORDER — FENTANYL CITRATE (PF) 250 MCG/5ML IJ SOLN
INTRAMUSCULAR | Status: DC | PRN
  Administered 2019-10-23 (×2): 100 ug via INTRAVENOUS
  Administered 2019-10-23: 50 ug via INTRAVENOUS

## 2019-10-23 MED ORDER — SODIUM CHLORIDE 0.9% IV SOLUTION: Freq: Once | INTRAVENOUS | Status: DC

## 2019-10-23 MED ORDER — SODIUM CHLORIDE 0.9 % IV SOLN
INTRAVENOUS | Status: DC | PRN
  Administered 2019-10-23: 500 mL

## 2019-10-23 MED ORDER — MIDAZOLAM HCL 2 MG/2ML IJ SOLN
INTRAMUSCULAR | Status: AC
  Filled 2019-10-23: qty 2

## 2019-10-23 MED ORDER — PROPOFOL 10 MG/ML IV BOLUS
INTRAVENOUS | Status: AC
  Filled 2019-10-23: qty 40

## 2019-10-23 MED ORDER — FENTANYL CITRATE (PF) 250 MCG/5ML IJ SOLN
INTRAMUSCULAR | Status: AC
  Filled 2019-10-23: qty 5

## 2019-10-23 MED ORDER — VASOPRESSIN 20 UNIT/ML IV SOLN
INTRAVENOUS | Status: DC | PRN
  Administered 2019-10-23: 1 [IU] via INTRAVENOUS

## 2019-10-23 MED ORDER — MIDAZOLAM HCL 2 MG/2ML IJ SOLN
INTRAMUSCULAR | Status: DC | PRN
  Administered 2019-10-23: 2 mg via INTRAVENOUS

## 2019-10-23 MED ORDER — 0.9 % SODIUM CHLORIDE (POUR BTL) OPTIME
TOPICAL | Status: DC | PRN
  Administered 2019-10-23: 2000 mL

## 2019-10-23 MED ORDER — PROMETHAZINE HCL 25 MG/ML IJ SOLN: 6.2500 mg | INTRAMUSCULAR | Status: DC | PRN

## 2019-10-23 MED ORDER — ETOMIDATE 2 MG/ML IV SOLN
INTRAVENOUS | Status: DC | PRN
  Administered 2019-10-23: 18 mg via INTRAVENOUS

## 2019-10-23 MED ORDER — OXYCODONE-ACETAMINOPHEN 5-325 MG PO TABS
ORAL_TABLET | ORAL | Status: AC
  Filled 2019-10-23: qty 2

## 2019-10-23 MED ORDER — SUCCINYLCHOLINE CHLORIDE 200 MG/10ML IV SOSY
PREFILLED_SYRINGE | INTRAVENOUS | Status: DC | PRN
  Administered 2019-10-23: 120 mg via INTRAVENOUS

## 2019-10-23 MED ORDER — ONDANSETRON HCL 4 MG/2ML IJ SOLN
INTRAMUSCULAR | Status: AC
  Filled 2019-10-23: qty 2

## 2019-10-23 MED ORDER — LIDOCAINE 2% (20 MG/ML) 5 ML SYRINGE
INTRAMUSCULAR | Status: DC | PRN
  Administered 2019-10-23: 60 mg via INTRAVENOUS

## 2019-10-23 MED ORDER — DEXTROSE 50 % IV SOLN
INTRAVENOUS | Status: AC
  Filled 2019-10-23: qty 50

## 2019-10-23 MED ORDER — ETOMIDATE 2 MG/ML IV SOLN
INTRAVENOUS | Status: AC
  Filled 2019-10-23: qty 10

## 2019-10-23 MED ORDER — LACTATED RINGERS IV SOLN: INTRAVENOUS | Status: DC | PRN

## 2019-10-23 MED ORDER — ALBUTEROL SULFATE HFA 108 (90 BASE) MCG/ACT IN AERS
INHALATION_SPRAY | RESPIRATORY_TRACT | Status: AC
  Filled 2019-10-23: qty 6.7

## 2019-10-23 MED ORDER — PHENYLEPHRINE 40 MCG/ML (10ML) SYRINGE FOR IV PUSH (FOR BLOOD PRESSURE SUPPORT)
PREFILLED_SYRINGE | INTRAVENOUS | Status: DC | PRN
  Administered 2019-10-23: 120 ug via INTRAVENOUS

## 2019-10-23 MED ORDER — PHENYLEPHRINE HCL-NACL 10-0.9 MG/250ML-% IV SOLN
INTRAVENOUS | Status: DC | PRN
  Administered 2019-10-23: 25 ug/min via INTRAVENOUS

## 2019-10-23 MED ORDER — ALBUTEROL SULFATE HFA 108 (90 BASE) MCG/ACT IN AERS
INHALATION_SPRAY | RESPIRATORY_TRACT | Status: DC | PRN
  Administered 2019-10-23: 4 via RESPIRATORY_TRACT

## 2019-10-23 MED ORDER — FENTANYL CITRATE (PF) 100 MCG/2ML IJ SOLN: 25.0000 ug | INTRAMUSCULAR | Status: DC | PRN

## 2019-10-23 MED ORDER — ALBUMIN HUMAN 5 % IV SOLN: INTRAVENOUS | Status: DC | PRN

## 2019-10-23 MED ORDER — CHLORHEXIDINE GLUCONATE CLOTH 2 % EX PADS
6.0000 | MEDICATED_PAD | Freq: Every day | CUTANEOUS | Status: DC
  Administered 2019-10-23 – 2019-11-07 (×15): 6 via TOPICAL

## 2019-10-23 MED ORDER — PHENYLEPHRINE 40 MCG/ML (10ML) SYRINGE FOR IV PUSH (FOR BLOOD PRESSURE SUPPORT)
PREFILLED_SYRINGE | INTRAVENOUS | Status: AC
  Filled 2019-10-23: qty 10

## 2019-10-23 MED ORDER — DEXTROSE 50 % IV SOLN
50.0000 mL | INTRAVENOUS | Status: DC | PRN
  Administered 2019-10-23: 50 mL via INTRAVENOUS
  Administered 2019-11-01: 25 mL via INTRAVENOUS
  Filled 2019-10-23: qty 50

## 2019-10-23 MED ORDER — SODIUM CHLORIDE 0.9 % IV BOLUS
1000.0000 mL | Freq: Once | INTRAVENOUS | Status: AC | PRN
  Administered 2019-10-23: 1000 mL via INTRAVENOUS

## 2019-10-23 MED ORDER — DEXAMETHASONE SODIUM PHOSPHATE 10 MG/ML IJ SOLN
INTRAMUSCULAR | Status: DC | PRN
  Administered 2019-10-23: 5 mg via INTRAVENOUS

## 2019-10-23 MED ORDER — LIDOCAINE 2% (20 MG/ML) 5 ML SYRINGE
INTRAMUSCULAR | Status: AC
  Filled 2019-10-23: qty 5

## 2019-10-23 MED ORDER — SUGAMMADEX SODIUM 200 MG/2ML IV SOLN
INTRAVENOUS | Status: DC | PRN
  Administered 2019-10-23: 200 mg via INTRAVENOUS

## 2019-10-23 MED ORDER — SODIUM CHLORIDE 0.9 % IV SOLN
INTRAVENOUS | Status: AC
  Filled 2019-10-23: qty 1.2

## 2019-10-23 MED ORDER — ROCURONIUM BROMIDE 10 MG/ML (PF) SYRINGE
PREFILLED_SYRINGE | INTRAVENOUS | Status: DC | PRN
  Administered 2019-10-23: 60 mg via INTRAVENOUS

## 2019-10-23 SURGICAL SUPPLY — 57 items
BANDAGE ESMARK 6X9 LF (GAUZE/BANDAGES/DRESSINGS) IMPLANT
BNDG ESMARK 6X9 LF (GAUZE/BANDAGES/DRESSINGS)
BNDG GAUZE ELAST 4 BULKY (GAUZE/BANDAGES/DRESSINGS) ×4 IMPLANT
CANISTER SUCT 3000ML PPV (MISCELLANEOUS) ×2 IMPLANT
CANNULA VESSEL 3MM 2 BLNT TIP (CANNULA) ×4 IMPLANT
CLIP LIGATING EXTRA MED SLVR (CLIP) ×4 IMPLANT
CLIP LIGATING EXTRA SM BLUE (MISCELLANEOUS) ×4 IMPLANT
COVER WAND RF STERILE (DRAPES) ×2 IMPLANT
CUFF TOURN SGL QUICK 34 (TOURNIQUET CUFF)
CUFF TOURN SGL QUICK 42 (TOURNIQUET CUFF) IMPLANT
CUFF TRNQT CYL 34X4.125X (TOURNIQUET CUFF) IMPLANT
DERMABOND ADVANCED (GAUZE/BANDAGES/DRESSINGS) ×1
DERMABOND ADVANCED .7 DNX12 (GAUZE/BANDAGES/DRESSINGS) ×1 IMPLANT
DRAIN SNY 10X20 3/4 PERF (WOUND CARE) IMPLANT
DRAPE HALF SHEET 40X57 (DRAPES) IMPLANT
DRAPE X-RAY CASS 24X20 (DRAPES) IMPLANT
DRSG COVADERM 4X6 (GAUZE/BANDAGES/DRESSINGS) ×2 IMPLANT
ELECT REM PT RETURN 9FT ADLT (ELECTROSURGICAL) ×2
ELECTRODE REM PT RTRN 9FT ADLT (ELECTROSURGICAL) ×1 IMPLANT
EVACUATOR SILICONE 100CC (DRAIN) ×2 IMPLANT
GLOVE BIO SURGEON STRL SZ 6.5 (GLOVE) ×2 IMPLANT
GLOVE BIO SURGEON STRL SZ7 (GLOVE) ×2 IMPLANT
GLOVE BIO SURGEON STRL SZ7.5 (GLOVE) ×2 IMPLANT
GLOVE BIOGEL PI IND STRL 6 (GLOVE) ×1 IMPLANT
GLOVE BIOGEL PI IND STRL 6.5 (GLOVE) ×1 IMPLANT
GLOVE BIOGEL PI IND STRL 7.0 (GLOVE) ×1 IMPLANT
GLOVE BIOGEL PI INDICATOR 6 (GLOVE) ×1
GLOVE BIOGEL PI INDICATOR 6.5 (GLOVE) ×1
GLOVE BIOGEL PI INDICATOR 7.0 (GLOVE) ×1
GLOVE SS BIOGEL STRL SZ 7.5 (GLOVE) ×1 IMPLANT
GLOVE SUPERSENSE BIOGEL SZ 7.5 (GLOVE) ×1
GOWN STRL REUS W/ TWL LRG LVL3 (GOWN DISPOSABLE) ×3 IMPLANT
GOWN STRL REUS W/TWL LRG LVL3 (GOWN DISPOSABLE) ×6
INSERT FOGARTY SM (MISCELLANEOUS) IMPLANT
KIT BASIN OR (CUSTOM PROCEDURE TRAY) ×2 IMPLANT
KIT TURNOVER KIT B (KITS) ×2 IMPLANT
NS IRRIG 1000ML POUR BTL (IV SOLUTION) ×4 IMPLANT
PACK PERIPHERAL VASCULAR (CUSTOM PROCEDURE TRAY) ×2 IMPLANT
PAD ABD 8X10 STRL (GAUZE/BANDAGES/DRESSINGS) ×2 IMPLANT
PAD ARMBOARD 7.5X6 YLW CONV (MISCELLANEOUS) ×4 IMPLANT
PADDING CAST COTTON 6X4 STRL (CAST SUPPLIES) IMPLANT
SET COLLECT BLD 21X3/4 12 (NEEDLE) IMPLANT
SPONGE LAP 18X18 RF (DISPOSABLE) ×4 IMPLANT
STOPCOCK 4 WAY LG BORE MALE ST (IV SETS) IMPLANT
SUT ETHILON 3 0 FSL (SUTURE) ×2 IMPLANT
SUT ETHILON 3 0 PS 1 (SUTURE) IMPLANT
SUT PROLENE 5 0 C 1 24 (SUTURE) ×2 IMPLANT
SUT PROLENE 6 0 CC (SUTURE) ×12 IMPLANT
SUT SILK 2 0 SH (SUTURE) ×2 IMPLANT
SUT VIC AB 2-0 CTX 36 (SUTURE) ×6 IMPLANT
SUT VIC AB 3-0 SH 27 (SUTURE) ×4
SUT VIC AB 3-0 SH 27X BRD (SUTURE) ×2 IMPLANT
TOWEL GREEN STERILE (TOWEL DISPOSABLE) ×2 IMPLANT
TRAY FOLEY MTR SLVR 16FR STAT (SET/KITS/TRAYS/PACK) ×4 IMPLANT
TUBING EXTENTION W/L.L. (IV SETS) IMPLANT
UNDERPAD 30X30 (UNDERPADS AND DIAPERS) ×2 IMPLANT
WATER STERILE IRR 1000ML POUR (IV SOLUTION) ×2 IMPLANT

## 2019-10-23 NOTE — Progress Notes (Signed)
Patient care plan reviewed. Patient progressing. VS stable. EKG NSR on the monitor. BP within normal range. Patient pain controlled with PRN Percocet given at 2348. Patient has insulin pump with a basal rate of 1.25 U/hr. Patient has not given himself any bolus insulin at this time. Will continue to monitor. Adella Hare, RN

## 2019-10-23 NOTE — Op Note (Signed)
    OPERATIVE REPORT  DATE OF SURGERY: 10/23/2019  PATIENT: [name], 69 y.o. male MRN: [id number]  DOB: [birth year]  PRE-OPERATIVE DIAGNOSIS: Acute bleed from left leg vein bypass  POST-OPERATIVE DIAGNOSIS:  Same  PROCEDURE: Exploration of left thigh wound with primary repair of saphenous vein bypass from superficial femoral artery to peroneal artery.  Evacuation of extensive chronic hematoma in the thigh in the popliteal space.  Blake drain placement  SURGEON:  Curt Jews, M.D.  PHYSICIAN ASSISTANT: Matt Eveland, PA-C  ANESTHESIA: General  EBL: per anesthesia record  Total I/O In: 2230 [I.V.:1100; Blood:630; IV Piggyback:500] Out: 808 [Urine:175; Blood:400]  BLOOD ADMINISTERED: none  DRAINS: none  SPECIMEN: none  COUNTS CORRECT:  YES  PATIENT DISPOSITION:  PACU - hemodynamically stable  PROCEDURE DETAILS: Patient is status post left superficial femoral artery to peroneal bypass in January 2020.  Had multiple postoperative complications with wound issues in the popliteal space.  Last debridement was in June 2020.  He presented several days ago with collection of pus in his medial thigh and went to the operating room on 10/21/2018 and had opening of the wound and a large amount of purulence removed.  I was called at 5 AM due to significant bleed from his wound.  He clearly had an arterial bleed and was taken emergently to the operating room.  On exploration he had punctate area of bleeding from the vein graft.  This was initially was controlled with proximal clamp.  The vein appeared to be viable except for this punctate area.  The hole in the vein was closed with interrupted 6-0 Prolene sutures.  Clamps were removed and a normal pulse was noted below the vein graft.  There was extensive chronic clot in the popliteal space extending below the knee.  This was all bluntly debrided and there is also chronic clot and hematoma approximately in the thigh.  This was all bluntly  debrided as well.  No further bleeding was noted.  A Blake drain was brought through a separate stab incision in the below-knee position and positioned in the popliteal space.  Due to my concern for potential rebleeding from this area and the vein graft I did position a blue vessel loop around the proximal vein graft and a Potts configuration.  I brought this out through a separate stab incision and placed under dressing.  This was done for hemostasis if he has rebleeding from the vein bypass.  The posterior muscles were closed over the vein graft in 2 layers of running Vicryl.  The skin was left open and was packed with saline soaked Kerlix.  The patient was transferred to the recovery in stable condition   [name], M.D., [facility] 10/23/2019 9:09 AM

## 2019-10-23 NOTE — Anesthesia Procedure Notes (Signed)
Procedure Name: Intubation Date/Time: 10/23/2019 6:47 AM Performed by: Kathryne Hitch, CRNA Pre-anesthesia Checklist: Patient identified, Emergency Drugs available, Suction available, Patient being monitored and Timeout performed Patient Re-evaluated:Patient Re-evaluated prior to induction Oxygen Delivery Method: Circle system utilized Preoxygenation: Pre-oxygenation with 100% oxygen Induction Type: IV induction, Rapid sequence and Cricoid Pressure applied Laryngoscope Size: Miller and 2 Grade View: Grade III Tube type: Oral Tube size: 7.5 mm Number of attempts: 2 (DL x 1 by CRNA- grade 4 view and no attempt at placing ETT. DL x 1 by MDA with grade 3 view- placement of ETT.) Airway Equipment and Method: Stylet Placement Confirmation: ETT inserted through vocal cords under direct vision,  positive ETCO2 and breath sounds checked- equal and bilateral Secured at: 22 cm Tube secured with: Tape Dental Injury: Teeth and Oropharynx as per pre-operative assessment

## 2019-10-23 NOTE — Transfer of Care (Signed)
Immediate Anesthesia Transfer of Care Note  Patient: [name]  Procedure(s) Performed: EXPLORATION  and Repair OF LEFT  FEMORAL-POPLITEAL ARTERY BYPASS, Evacuation of Hematoma, and Drain placement. (Left Leg Upper)  Patient Location: PACU  Anesthesia Type:General  Level of Consciousness: drowsy and patient cooperative  Airway & Oxygen Therapy: Patient Spontanous Breathing and Patient connected to face mask oxygen  Post-op Assessment: Report given to RN and Post -op Vital signs reviewed and stable  Post vital signs: Reviewed and stable  Last Vitals:  Vitals Value Taken Time  BP 118/69 10/23/19 0848  Temp    Pulse 76 10/23/19 0850  Resp 18 10/23/19 0850  SpO2 100 % 10/23/19 0850  Vitals shown include unvalidated device data.  Last Pain:  Vitals:   10/23/19 0528  TempSrc:   PainSc: 10-Worst pain ever      Patients Stated Pain Goal: 4 (11/10/31 5825)  Complications: No apparent anesthesia complications

## 2019-10-23 NOTE — Progress Notes (Addendum)
Pt called staff nurse at bedside, Pt's screaming and having sudden severe pain on his left thigh on surgical site, stated he could not tolerate, and he unwarpped the dressing by himself. The wound was opened had some bleeding. Staff nurse tried to changed dressing and put new ABD pad and pressure dressing on. Left thigh was getting more tight and firm with and severe pain, then massive bleeding stared. Morphine given IV 2 mg. Notified Dr. Donnetta Hutching and RRT, Mindy at bedside.  1000 ML IV bolus given, BP 77/39 -91/55, HR 70s, sinus rhythm with frequent PVC on monitor. Pt getting more pale. Stat NPO, plan emergency to OR, Lab ordered for type and screen, cross-match, BMP, CBC, INR PT,PTT.  CBG 61 mg/dl, Pt had insulin pump, Pt took his insulin pump out before transferring  to OR. 50% dextrose 25 mg( 50ML) given before transferring to OR.  Pt left the unit 4E18 to OR at 0635 am. Will obtain consent in OR.  Kennyth Lose, RN

## 2019-10-23 NOTE — Anesthesia Preprocedure Evaluation (Signed)
Anesthesia Evaluation  Patient identified by MRN, date of birth, ID band Patient awake    Reviewed: Allergy & Precautions, NPO status Preop documentation limited or incomplete due to emergent nature of procedure.  History of Anesthesia Complications Negative for: history of anesthetic complications  Airway Mallampati: III  TM Distance: >3 FB     Dental  (+) Poor Dentition   Pulmonary former smoker,    breath sounds clear to auscultation       Cardiovascular hypertension, + Peripheral Vascular Disease   Rhythm:Regular Rate:Tachycardia     Neuro/Psych PSYCHIATRIC DISORDERS Depression negative neurological ROS     GI/Hepatic   Endo/Other  diabetes, Insulin Dependent  Renal/GU Renal InsufficiencyRenal disease     Musculoskeletal   Abdominal   Peds  Hematology  (+) anemia ,   Anesthesia Other Findings   Reproductive/Obstetrics                             Anesthesia Physical Anesthesia Plan  ASA: III and emergent  Anesthesia Plan: General   Post-op Pain Management:    Induction: Intravenous, Rapid sequence and Cricoid pressure planned  PONV Risk Score and Plan: 3 and Ondansetron, Dexamethasone and Midazolam  Airway Management Planned: Oral ETT  Additional Equipment: Arterial line  Intra-op Plan:   Post-operative Plan: Extubation in OR  Informed Consent:     Dental advisory given  Plan Discussed with: Anesthesiologist, CRNA and Surgeon  Anesthesia Plan Comments:         Anesthesia Quick Evaluation

## 2019-10-23 NOTE — Progress Notes (Signed)
Patient ID: MIKO SIRICO, male   DOB: 04-28-1951, 69 y.o.   MRN: [id number] Called to see patient with bleeding from his left thigh wound.  Dropped his blood pressure to 80.  Patient reports that he had a sensation of pain and severe fullness in his left thigh and removed his Ace wrap.  Patient had significant bleeding which was controlled by the nursing staff with pressure.  Also blood sugar check was 60 and he is receiving an amp of D50 has been given 1 bolus of 1 L of saline  Large amount of frank blood in the bed.  Currently not bleeding and pressure held.  Will be taken emergently to the operating room for exploration with presumed graft disruption  In reviewing his old chart he has a very complex history.  He initially underwent left superficial femoral to peroneal bypass on 07/01/2018 with Dr.Cain.  He had multiple subsequent returns to the operating room for incision and drainage.  Most recently this was in December 23, 2018.  He presented last week with fluid collection and was taken to the operating room on 10/21/2019.  Had drainage of half liter of pus and the bypass graft was present in the wound base.  I discussed this with the patient.  Explained that we will take him back for reexploration and may need to harvest vein from his right leg depending on operative findings.  I will attempt to contact his wife by telephone prior to surgery

## 2019-10-23 NOTE — Progress Notes (Addendum)
Progress Note    10/23/2019 2:02 PM Day of Surgery  Subjective:  Patient is currently sleeping soundly and I did not awaken him.  Left groin abscess s/p I and D on Wednesday. Emergent return to OR for bleeding of LLE bypass graft due to infection this morning. Received one unit of FFP and 3 units of PCs  Vitals:   10/23/19 1116 10/23/19 1201  BP: (!) 102/48 139/80  Pulse: 83 74  Resp: 16 (!) 26  Temp: 97.8 F (36.6 C) 97.7 F (36.5 C)  SpO2: 100% 100%    Physical Exam: Cardiac:  RRR Lungs:  Non labored Extremities:  LLE surgical dressing dry and intact.     CBC    Component Value Date/Time   WBC 18.2 (H) 10/23/2019 0618   RBC 2.33 (L) 10/23/2019 0618   HGB 6.8 (LL) 10/23/2019 0827   HCT 20.0 (L) 10/23/2019 0827   PLT 349 10/23/2019 0618   MCV 76.0 (L) 10/23/2019 0618   MCH 24.5 (L) 10/23/2019 0618   MCHC 32.2 10/23/2019 0618   RDW 16.8 (H) 10/23/2019 0618   LYMPHSABS 1.2 01/09/2019 0622   MONOABS 0.6 01/09/2019 0622   EOSABS 0.2 01/09/2019 0622   BASOSABS 0.0 01/09/2019 0622    BMET    Component Value Date/Time   NA 131 (L) 10/23/2019 0827   K 5.1 10/23/2019 0827   CL 104 10/23/2019 0827   CO2 20 (L) 10/23/2019 0618   GLUCOSE 258 (H) 10/23/2019 0827   BUN 23 10/23/2019 0827   CREATININE 1.60 (H) 10/23/2019 0827   CALCIUM 7.6 (L) 10/23/2019 0618   GFRNONAA 40 (L) 10/23/2019 0618   GFRAA 46 (L) 10/23/2019 0618     Intake/Output Summary (Last 24 hours) at 10/23/2019 1402 Last data filed at 10/23/2019 1201 Gross per 24 hour  Intake 7736.99 ml  Output 2475 ml  Net 5261.99 ml   75 cc output charted from surgical drain.  Currently with approximately 25-30cc serosanguinous drainage in bulb HOSPITAL MEDICATIONS Scheduled Meds: . sodium chloride   Intravenous Once  . sodium chloride   Intravenous Once  . allopurinol  100 mg Oral Daily  . Chlorhexidine Gluconate Cloth  6 each Topical Daily  . clopidogrel  75 mg Oral Daily  . ferrous sulfate  325 mg  Oral Q breakfast  . insulin aspart  0-15 Units Subcutaneous TID WC  . insulin pump   Subcutaneous TID WC, HS, 0200  . oxyCODONE-acetaminophen      . pantoprazole  40 mg Oral QHS  . potassium chloride  20-40 mEq Oral Once  . pravastatin  40 mg Oral Q lunch  . pregabalin  50 mg Oral QHS   Continuous Infusions: . sodium chloride 75 mL/hr at 10/23/19 0557  . sodium chloride 999 mL/hr at 10/23/19 0557  . vancomycin 1,000 mg (10/22/19 2334)   PRN Meds:.acetaminophen **OR** acetaminophen, alum & mag hydroxide-simeth, bisacodyl, dextrose, guaiFENesin-dextromethorphan, hydrALAZINE, labetalol, metoprolol tartrate, morphine injection, ondansetron, oxyCODONE-acetaminophen, phenol, polyethylene glycol  Assessment:  69 y.o. male is s/p: repair of disrupted LLE SFA to distal tibial bypass left saphenous vein graft.  VSS. Dressings dry and Blake drain without frank heme.   Day of Surgery  Plan: Will continue to hold Xarelto.  Dressing change with Dr. Carlis Abbott tomorrow at bedside.  Patient has vessel loop encircling vein graft in the case of re-bleeding.  Repeat H and H pending Risa Grill, PA-C [facility] [phone] 10/23/2019  2:02 PM   I have independently  interviewed and examined patient and agree with PA assessment and plan above.  I had extensive discussion with the patient and his wife.  Certainly this is a limb threatening situation particularly if he has another blowout of the bypass graft.  They are aware of this.  Continue antibiotics at the discretion of infectious disease.  Gianmarco Roye C. Donzetta Matters, MD [facility] [place] Office: [phone] Pager: [phone]

## 2019-10-23 NOTE — Progress Notes (Signed)
Emergent blood given in PACU.  Berneta Sages, RN

## 2019-10-23 NOTE — Significant Event (Addendum)
Rapid Response Event Note  Overview:Called d/t bleeding from L thigh vein bypass I and D site. Per RN, site was spurting blood.   Initial Focused Assessment: Pt laying in bed, alert and oriented. Pt c/o some lightheadedness and L leg pain from pressure being held to site. Pt denies chest pain and SOB. Lungs CTA. Skin pale and cool to touch. Pt dropping SpO2 intermittently to the 70s on 2L [place]. FiO2 increased to 6L [place]-SpO2-100%. L thigh site with pressure being held. Above thigh is very tight and swollen-this area was marked. Large amount blood in bed from site. L foot pulse dopplered. T-99, HR-70, SBP-70s to 110s, RR-16, SpO2-100% on 6L [place].  Interventions: Pressure held to site x 45 min 1L NS bolus T&C, CBC/BMP/PT/PTT CBG-61-1 amp D50 given IV and pt took his insulin pump off.  Dr. Donnetta Hutching to bedside: site uncovered-bleeding has slowed. Site redressed by Dr. Richarda Osmond ABDs and ACE wrap applied to site) Dr. Donnetta Hutching to take pt back to OR.  Plan of Care (if not transferred): Continue to monitor pt. Await labs results. Transport pt to OR when they are ready.   0630-Site bleeding thru dressing, pressure held, pt transported to OR.  Event Summary:  Dr. Donnetta Hutching called PTA RRT @ 0500 Called: 0505 Arrived: 0510 Ended: 0615  Dillard Essex

## 2019-10-23 NOTE — Progress Notes (Signed)
[facility]  Date of Admission:  10/21/2019      Total days of antibiotics 3  Vancomycin day 3    ASSESSMENT: [name] is a 69 y.o. male here with complicated vascular and infection history - underwent I&D of purulent abscess in the left medial thigh on 4/28 after he experienced a fairly sudden return of pain and swelling behind the left knee where he previously had early post op infection of surgical site. Cultures are growing staphylococcus aureus (new organism, previously Group B Strep) with sensitivities pending at this time. He urgently was taken back this AM by Dr. Donnetta Hutching for repair of bleeding vein graft site - this did required resection of non-viable segment.   Infection of a vascular stent would be very rare - it does sound like he had early thrombosis of the stent when first place last year; he has not had any evidence of bacteremia since this stent was placed during multiple hospitalizations and several sets of negative blood cultures. While blood cultures were not drawn at this current hospitalization he had no findings that would be suspicious for staph bacteremia and denied any chills/fevers/rigors or diaphoresis at home. Explained to his wife that while not impossible this would be much less likely.  Wondering more if infected hematoma was the onset of current abscess?  Will continue to follow micro and adjust antibiotics.   Creatinine clearance continues to improve - will follow closely on IV Vancomycin.   Dr. Tommy Medal is on call this weekend for questions and will monitor cultures/remotely.    PLAN: 1. Continue vancomycin  2. Follow micro from 4/28 OR for staph sensi's     Principal Problem:   Wound infection after surgery Active Problems:   Diabetes mellitus type 2 in obese (HCC)   CKD (chronic kidney disease) stage 3, GFR 30-59 ml/min   Ischemic leg   . sodium chloride   Intravenous Once  . sodium chloride    Intravenous Once  . allopurinol  100 mg Oral Daily  . Chlorhexidine Gluconate Cloth  6 each Topical Daily  . clopidogrel  75 mg Oral Daily  . ferrous sulfate  325 mg Oral Q breakfast  . insulin aspart  0-15 Units Subcutaneous TID WC  . insulin pump   Subcutaneous TID WC, HS, 0200  . oxyCODONE-acetaminophen      . pantoprazole  40 mg Oral QHS  . potassium chloride  20-40 mEq Oral Once  . pravastatin  40 mg Oral Q lunch  . pregabalin  50 mg Oral QHS    SUBJECTIVE: Events noted early this morning including sudden onset arterial bleed from a small hole in vein graft. Emergently taken to OR for exploration - there was extensive chronic clot in the popliteal space extending below the knee and up into the thigh. He feels much better now and the pain is improved. JP drain in place with bloody contents.   His wife is at the bedside and has concerns over whether the stent that remains in his vasculature is infected.    Review of Systems: Review of Systems  Constitutional: Negative for chills, fever and malaise/fatigue.  Cardiovascular: Positive for leg swelling (following surgery ). Negative for chest pain.  Gastrointestinal: Negative for abdominal pain, nausea and vomiting.  Genitourinary: Negative for dysuria.  Musculoskeletal: Negative for joint pain.  Skin: Negative for rash (chronic stasis changes to skin ).  Neurological: Negative for dizziness and  headaches.    No Known Allergies  OBJECTIVE: Vitals:   10/23/19 1046 10/23/19 1110 10/23/19 1116 10/23/19 1201  BP:  117/72 (!) 102/48 139/80  Pulse: 75 74 83 74  Resp:  17 16 (!) 26  Temp:  98.2 F (36.8 C) 97.8 F (36.6 C) 97.7 F (36.5 C)  TempSrc:  Oral Oral Oral  SpO2: 100%  100% 100%  Weight:      Height:       Body mass index is 30.56 kg/m.  Physical Exam Constitutional:      Comments: Resting in bed comfortably and in no distress.   HENT:     Mouth/Throat:     Mouth: Mucous membranes are dry.     Pharynx:  Oropharynx is clear.  Eyes:     General: No scleral icterus.    Pupils: Pupils are equal, round, and reactive to light.  Cardiovascular:     Rate and Rhythm: Normal rate and regular rhythm.  Pulmonary:     Effort: Pulmonary effort is normal.  Abdominal:     General: There is no distension.  Musculoskeletal:     Comments: Left leg dressed with clean/dry dressings from medial thigh down to mid calf. JP drain in place with some sanguinous contents.   Skin:    General: Skin is warm and dry.  Neurological:     Mental Status: He is alert.     Lab Results Lab Results  Component Value Date   WBC 18.2 (H) 10/23/2019   HGB 6.8 (LL) 10/23/2019   HCT 20.0 (L) 10/23/2019   MCV 76.0 (L) 10/23/2019   PLT 349 10/23/2019    Lab Results  Component Value Date   CREATININE 1.60 (H) 10/23/2019   BUN 23 10/23/2019   NA 131 (L) 10/23/2019   K 5.1 10/23/2019   CL 104 10/23/2019   CO2 20 (L) 10/23/2019    Lab Results  Component Value Date   ALT 16 12/27/2018   AST 22 12/27/2018   ALKPHOS 87 12/27/2018   BILITOT 0.6 12/27/2018     Microbiology: Recent Results (from the past 240 hour(s))  Respiratory Panel by RT PCR (Flu A&B, Covid) - Nasopharyngeal Swab     Status: None   Collection Time: 10/21/19 10:55 AM   Specimen: Nasopharyngeal Swab  Result Value Ref Range Status   SARS Coronavirus 2 by RT PCR NEGATIVE NEGATIVE Final    Comment: (NOTE) SARS-CoV-2 target nucleic acids are NOT DETECTED. The SARS-CoV-2 RNA is generally detectable in upper respiratoy specimens during the acute phase of infection. The lowest concentration of SARS-CoV-2 viral copies this assay can detect is 131 copies/mL. A negative result does not preclude SARS-Cov-2 infection and should not be used as the sole basis for treatment or other patient management decisions. A negative result may occur with  improper specimen collection/handling, submission of specimen other than nasopharyngeal swab, presence of viral  mutation(s) within the areas targeted by this assay, and inadequate number of viral copies (<131 copies/mL). A negative result must be combined with clinical observations, patient history, and epidemiological information. The expected result is Negative. Fact Sheet for Patients:  PinkCheek.be Fact Sheet for Healthcare Providers:  GravelBags.it This test is not yet ap proved or cleared by the [place] FDA and  has been authorized for detection and/or diagnosis of SARS-CoV-2 by FDA under an Emergency Use Authorization (EUA). This EUA will remain  in effect (meaning this test can be used) for the duration of the COVID-19 declaration under  Section 564(b)(1) of the Act, 21 U.S.C. section 360bbb-3(b)(1), unless the authorization is terminated or revoked sooner.    Influenza A by PCR NEGATIVE NEGATIVE Final   Influenza B by PCR NEGATIVE NEGATIVE Final    Comment: (NOTE) The Xpert Xpress SARS-CoV-2/FLU/RSV assay is intended as an aid in  the diagnosis of influenza from Nasopharyngeal swab specimens and  should not be used as a sole basis for treatment. Nasal washings and  aspirates are unacceptable for Xpert Xpress SARS-CoV-2/FLU/RSV  testing. Fact Sheet for Patients: PinkCheek.be Fact Sheet for Healthcare Providers: GravelBags.it This test is not yet approved or cleared by the [place] FDA and  has been authorized for detection and/or diagnosis of SARS-CoV-2 by  FDA under an Emergency Use Authorization (EUA). This EUA will remain  in effect (meaning this test can be used) for the duration of the  Covid-19 declaration under Section 564(b)(1) of the Act, 21  U.S.C. section 360bbb-3(b)(1), unless the authorization is  terminated or revoked. Performed at [facility], [place] [address], [place], [place] [address]   Aerobic/Anaerobic Culture (surgical/deep wound)      Status: None (Preliminary result)   Collection Time: 10/21/19  6:51 PM   Specimen: PATH Other; Tissue  Result Value Ref Range Status   Specimen Description ABSCESS LEFT UPPER LEG  Final   Special Requests PT ON ANCEF  Final   Gram Stain   Final    FEW WBC PRESENT, PREDOMINANTLY MONONUCLEAR ABUNDANT GRAM POSITIVE COCCI    Culture   Final    MODERATE STAPHYLOCOCCUS AUREUS SUSCEPTIBILITIES TO FOLLOW Performed at [facility], [place] [address], [place], [place] [address]    Report Status PENDING  Incomplete     Janene Madeira, MSN, NP-C [facility] [email or web address] Pager: [phone] Office: [phone] [place]: [phone]

## 2019-10-23 NOTE — Anesthesia Postprocedure Evaluation (Signed)
Anesthesia Post Note  Patient: [name]  Procedure(s) Performed: EXPLORATION  and Repair OF LEFT  FEMORAL-POPLITEAL ARTERY BYPASS, Evacuation of Hematoma, and Drain placement. (Left Leg Upper)     Patient location during evaluation: PACU Anesthesia Type: General Level of consciousness: sedated Pain management: pain level controlled Vital Signs Assessment: post-procedure vital signs reviewed and stable Respiratory status: spontaneous breathing and respiratory function stable Cardiovascular status: stable Postop Assessment: no apparent nausea or vomiting Anesthetic complications: no    Last Vitals:  Vitals:   10/23/19 1110 10/23/19 1116  BP: 117/72 (!) 102/48  Pulse: 74 83  Resp: 17 16  Temp: 36.8 C 36.6 C  SpO2:  100%    Last Pain:  Vitals:   10/23/19 1116  TempSrc: Oral  PainSc:                  [place]

## 2019-10-24 LAB — BPAM FFP
Blood Product Expiration Date: 202105032359
Blood Product Expiration Date: 202105032359
ISSUE DATE / TIME: 202104300841
ISSUE DATE / TIME: 202104300841
Unit Type and Rh: 600
Unit Type and Rh: 6200

## 2019-10-24 LAB — GLUCOSE, CAPILLARY
Glucose-Capillary: 404 mg/dL — ABNORMAL HIGH (ref 70–99)
Glucose-Capillary: 296 mg/dL — ABNORMAL HIGH (ref 70–99)
Glucose-Capillary: 178 mg/dL — ABNORMAL HIGH (ref 70–99)
Glucose-Capillary: 181 mg/dL — ABNORMAL HIGH (ref 70–99)
Glucose-Capillary: 141 mg/dL — ABNORMAL HIGH (ref 70–99)
Glucose-Capillary: 84 mg/dL (ref 70–99)
Glucose-Capillary: 191 mg/dL — ABNORMAL HIGH (ref 70–99)

## 2019-10-24 LAB — BASIC METABOLIC PANEL
Anion gap: 8 (ref 5–15)
BUN: 24 mg/dL — ABNORMAL HIGH (ref 8–23)
CO2: 18 mmol/L — ABNORMAL LOW (ref 22–32)
Calcium: 8.1 mg/dL — ABNORMAL LOW (ref 8.9–10.3)
Chloride: 109 mmol/L (ref 98–111)
Creatinine, Ser: 1.53 mg/dL — ABNORMAL HIGH (ref 0.61–1.24)
GFR calc Af Amer: 53 mL/min — ABNORMAL LOW (ref >60)
GFR calc non Af Amer: 46 mL/min — ABNORMAL LOW (ref >60)
Glucose, Bld: 172 mg/dL — ABNORMAL HIGH (ref 70–99)
Potassium: 4.6 mmol/L (ref 3.5–5.1)
Sodium: 135 mmol/L (ref 135–145)

## 2019-10-24 LAB — PREPARE FRESH FROZEN PLASMA
Unit division: 0
Unit division: 0

## 2019-10-24 LAB — CBC
HCT: 22.3 % — ABNORMAL LOW (ref 39.0–52.0)
Hemoglobin: 7.6 g/dL — ABNORMAL LOW (ref 13.0–17.0)
MCH: 27.9 pg (ref 26.0–34.0)
MCHC: 34.1 g/dL (ref 30.0–36.0)
MCV: 82 fL (ref 80.0–100.0)
Platelets: 194 10*3/uL (ref 150–400)
RBC: 2.72 MIL/uL — ABNORMAL LOW (ref 4.22–5.81)
RDW: 16.7 % — ABNORMAL HIGH (ref 11.5–15.5)
WBC: 20.6 10*3/uL — ABNORMAL HIGH (ref 4.0–10.5)
nRBC: 0 % (ref 0.0–0.2)

## 2019-10-24 MED ORDER — DOXYCYCLINE HYCLATE 100 MG PO TABS
100.0000 mg | ORAL_TABLET | Freq: Two times a day (BID) | ORAL | Status: DC
  Administered 2019-10-24 – 2019-11-07 (×28): 100 mg via ORAL
  Filled 2019-10-24 (×28): qty 1

## 2019-10-24 NOTE — Progress Notes (Addendum)
Progress Note    10/24/2019 10:24 AM 1 Day Post-Op  Subjective: No further bleeding.  Patient is comfortable without specific complaints.  Left groin abscess s/p I and D on Wednesday. Emergent return to OR for bleeding of LLE bypass graft due to infection yesterday morning. Received one unit of FFP and 3 units of PCs  Vitals:   10/24/19 0400 10/24/19 0800  BP: 118/66 (!) 107/57  Pulse: 65 72  Resp: 18 18  Temp: 97.9 F (36.6 C) 97.7 F (36.5 C)  SpO2: 97% 97%    Physical Exam: Cardiac: His heart rate and rhythm are regular Lungs: Lungs are clear to auscultation bilaterally Extremities: Dressing removed from left medial thigh wound.  Small amount of bleeding.  No purulence.  Blake drain in place with thin bloody output.  Approximately 135 cc charted.  Brisk dorsalis pedis dopplerable signal.  Approximately 2 cm x 1 cm left lateral foot ulcer.  There is granulation tissue present     CBC    Component Value Date/Time   WBC 20.6 (H) 10/24/2019 0500   RBC 2.72 (L) 10/24/2019 0500   HGB 7.6 (L) 10/24/2019 0500   HCT 22.3 (L) 10/24/2019 0500   PLT 194 10/24/2019 0500   MCV 82.0 10/24/2019 0500   MCH 27.9 10/24/2019 0500   MCHC 34.1 10/24/2019 0500   RDW 16.7 (H) 10/24/2019 0500   LYMPHSABS 1.2 01/09/2019 0622   MONOABS 0.6 01/09/2019 0622   EOSABS 0.2 01/09/2019 0622   BASOSABS 0.0 01/09/2019 0622    BMET    Component Value Date/Time   NA 135 10/24/2019 0500   K 4.6 10/24/2019 0500   CL 109 10/24/2019 0500   CO2 18 (L) 10/24/2019 0500   GLUCOSE 172 (H) 10/24/2019 0500   BUN 24 (H) 10/24/2019 0500   CREATININE 1.53 (H) 10/24/2019 0500   CALCIUM 8.1 (L) 10/24/2019 0500   GFRNONAA 46 (L) 10/24/2019 0500   GFRAA 53 (L) 10/24/2019 0500     Intake/Output Summary (Last 24 hours) at 10/24/2019 1024 Last data filed at 10/24/2019 0500 Gross per 24 hour  Intake 752 ml  Output 1435 ml  Net -683 ml    HOSPITAL MEDICATIONS Scheduled Meds: . sodium chloride    Intravenous Once  . sodium chloride   Intravenous Once  . allopurinol  100 mg Oral Daily  . Chlorhexidine Gluconate Cloth  6 each Topical Daily  . clopidogrel  75 mg Oral Daily  . ferrous sulfate  325 mg Oral Q breakfast  . insulin aspart  0-15 Units Subcutaneous TID WC  . insulin pump   Subcutaneous TID WC, HS, 0200  . pantoprazole  40 mg Oral QHS  . potassium chloride  20-40 mEq Oral Once  . pravastatin  40 mg Oral Q lunch  . pregabalin  50 mg Oral QHS   Continuous Infusions: . sodium chloride 75 mL/hr at 10/23/19 0557  . sodium chloride 999 mL/hr at 10/23/19 0557  . vancomycin 1,000 mg (10/23/19 2137)   PRN Meds:.acetaminophen **OR** acetaminophen, alum & mag hydroxide-simeth, bisacodyl, dextrose, guaiFENesin-dextromethorphan, hydrALAZINE, labetalol, metoprolol tartrate, morphine injection, ondansetron, oxyCODONE-acetaminophen, phenol, polyethylene glycol  Assessment:  69 y.o. male is s/p: Incision and drainage of left thigh abscess.  Developed disruption of previous left lower extremity vein bypass graft.  This was repaired emergently in the operating room yesterday morning.  Hemoglobin dropped from 8.4-7.6.  Patient is dynamically stable.  No evidence of bleeding today.   Wet to dry normal saline dressings reapplied. 1  Day Post-Op  Plan: -Continue local wound care, antibiotics and observation.  Okay to resume Plavix   Risa Grill, PA-C [facility] [phone] 10/24/2019  10:24 AM   I have seen and evaluated the patient. I agree with the PA note as documented above.  Status post I&D of left thigh abscess.  He ultimately developed a disruption from the bypass yesterday and went emergently to the OR with Dr. Donnetta Hutching and the bypass was repaired.  Hemoglobin this morning is 7.6.  No bleeding overnight.  He has good Doppler signals in the left foot.  We did change the dressing to the left thigh.  Creatinine stable at 1.53 and actually improved from his baseline.   Overall seems to be doing well.  Discussed he is high risk for rebleed.  Marty Heck, MD [facility] [place] Office: [phone]

## 2019-10-25 LAB — BASIC METABOLIC PANEL
Anion gap: 9 (ref 5–15)
BUN: 23 mg/dL (ref 8–23)
CO2: 20 mmol/L — ABNORMAL LOW (ref 22–32)
Calcium: 8.3 mg/dL — ABNORMAL LOW (ref 8.9–10.3)
Chloride: 106 mmol/L (ref 98–111)
Creatinine, Ser: 1.36 mg/dL — ABNORMAL HIGH (ref 0.61–1.24)
GFR calc Af Amer: >60 mL/min (ref >60)
GFR calc non Af Amer: 53 mL/min — ABNORMAL LOW (ref >60)
Glucose, Bld: 228 mg/dL — ABNORMAL HIGH (ref 70–99)
Potassium: 4.7 mmol/L (ref 3.5–5.1)
Sodium: 135 mmol/L (ref 135–145)

## 2019-10-25 LAB — CBC WITH DIFFERENTIAL/PLATELET
Abs Immature Granulocytes: 0.24 10*3/uL — ABNORMAL HIGH (ref 0.00–0.07)
Basophils Absolute: 0 10*3/uL (ref 0.0–0.1)
Basophils Relative: 0 %
Eosinophils Absolute: 0.1 10*3/uL (ref 0.0–0.5)
Eosinophils Relative: 1 %
HCT: 23.2 % — ABNORMAL LOW (ref 39.0–52.0)
Hemoglobin: 7.6 g/dL — ABNORMAL LOW (ref 13.0–17.0)
Immature Granulocytes: 2 %
Lymphocytes Relative: 12 %
Lymphs Abs: 1.9 10*3/uL (ref 0.7–4.0)
MCH: 27.8 pg (ref 26.0–34.0)
MCHC: 32.8 g/dL (ref 30.0–36.0)
MCV: 85 fL (ref 80.0–100.0)
Monocytes Absolute: 0.5 10*3/uL (ref 0.1–1.0)
Monocytes Relative: 4 %
Neutro Abs: 12.3 10*3/uL — ABNORMAL HIGH (ref 1.7–7.7)
Neutrophils Relative %: 81 %
Platelets: 245 10*3/uL (ref 150–400)
RBC: 2.73 MIL/uL — ABNORMAL LOW (ref 4.22–5.81)
RDW: 17.4 % — ABNORMAL HIGH (ref 11.5–15.5)
WBC: 15 10*3/uL — ABNORMAL HIGH (ref 4.0–10.5)
nRBC: 0 % (ref 0.0–0.2)

## 2019-10-25 LAB — GLUCOSE, CAPILLARY
Glucose-Capillary: 186 mg/dL — ABNORMAL HIGH (ref 70–99)
Glucose-Capillary: 243 mg/dL — ABNORMAL HIGH (ref 70–99)
Glucose-Capillary: 216 mg/dL — ABNORMAL HIGH (ref 70–99)
Glucose-Capillary: 179 mg/dL — ABNORMAL HIGH (ref 70–99)
Glucose-Capillary: 108 mg/dL — ABNORMAL HIGH (ref 70–99)

## 2019-10-25 NOTE — Progress Notes (Addendum)
Progress Note    10/25/2019 8:15 AM 2 Days Post-Op  Subjective: Denies pain this morning.   Vitals:   10/24/19 2300 10/25/19 0300  BP: (!) 142/84 (!) 163/72  Pulse: 72 70  Resp: (!) 22 (!) 21  Temp: 98.4 F (36.9 C) 98 F (36.7 C)  SpO2: 97% 98%    Physical Exam: Cardiac: Rate and rhythm are regular Lungs: Nonlabored Extremities: Dressing removed from left thigh wound.  No drainage.  Thigh is soft.  Left DP Doppler signal present.  Left foot wound continues to granulate  CBC    Component Value Date/Time   WBC 20.6 (H) 10/24/2019 0500   RBC 2.72 (L) 10/24/2019 0500   HGB 7.6 (L) 10/24/2019 0500   HCT 22.3 (L) 10/24/2019 0500   PLT 194 10/24/2019 0500   MCV 82.0 10/24/2019 0500   MCH 27.9 10/24/2019 0500   MCHC 34.1 10/24/2019 0500   RDW 16.7 (H) 10/24/2019 0500   LYMPHSABS 1.2 01/09/2019 0622   MONOABS 0.6 01/09/2019 0622   EOSABS 0.2 01/09/2019 0622   BASOSABS 0.0 01/09/2019 0622    BMET    Component Value Date/Time   NA 135 10/24/2019 0500   K 4.6 10/24/2019 0500   CL 109 10/24/2019 0500   CO2 18 (L) 10/24/2019 0500   GLUCOSE 172 (H) 10/24/2019 0500   BUN 24 (H) 10/24/2019 0500   CREATININE 1.53 (H) 10/24/2019 0500   CALCIUM 8.1 (L) 10/24/2019 0500   GFRNONAA 46 (L) 10/24/2019 0500   GFRAA 53 (L) 10/24/2019 0500     Intake/Output Summary (Last 24 hours) at 10/25/2019 0815 Last data filed at 10/25/2019 0601 Gross per 24 hour  Intake --  Output 3020 ml  Net -3020 ml    HOSPITAL MEDICATIONS Scheduled Meds: . sodium chloride   Intravenous Once  . sodium chloride   Intravenous Once  . allopurinol  100 mg Oral Daily  . Chlorhexidine Gluconate Cloth  6 each Topical Daily  . clopidogrel  75 mg Oral Daily  . doxycycline  100 mg Oral Q12H  . ferrous sulfate  325 mg Oral Q breakfast  . insulin pump   Subcutaneous TID WC, HS, 0200  . pantoprazole  40 mg Oral QHS  . potassium chloride  20-40 mEq Oral Once  . pravastatin  40 mg Oral Q lunch  .  pregabalin  50 mg Oral QHS   Continuous Infusions: . sodium chloride 75 mL/hr at 10/23/19 0557  . sodium chloride 999 mL/hr at 10/23/19 0557   PRN Meds:.acetaminophen **OR** acetaminophen, alum & mag hydroxide-simeth, bisacodyl, dextrose, guaiFENesin-dextromethorphan, hydrALAZINE, labetalol, metoprolol tartrate, morphine injection, ondansetron, oxyCODONE-acetaminophen, phenol, polyethylene glycol  Assessment:  69 y.o. male is s/p: Left groin abscess s/p I and D on 4/28. 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 Left thigh and left foot wounds are stable. 2 Days Post-Op  Plan: -DC maintenance IV fluids as he is tolerating his diet.  Will discuss with Dr. Carlis Abbott discontinuing Foley catheter and allowing him to ambulate.  Possibly DC art line.    Risa Grill, PA-C [facility] [phone] 10/25/2019  8:15 AM   I have seen and evaluated the patient. I agree with the PA note as documented above. Status post I&D of left thigh abscess.  He ultimately developed a disruption from the bypass and went emergently to the OR with Dr. Donnetta Hutching Friday am and the bypass was repaired.  No bleeding events through the weekend.  We will DC his A-line today as well as his Foley catheter and the drain in his leg.  Dressing changed at bedside.  Still has Doppler signals in the left foot.  Cultures are growing MRSA from the OR and appreciate ID input.  Looks like he has been de-escalated to doxycycline based on sensitivities.  Marty Heck, MD [facility] [place] Office: [phone]

## 2019-10-25 NOTE — Progress Notes (Signed)
Pt's arterial line removed today per MD order.  No complications to report at this time.  Pt will continue to be monitored.

## 2019-10-25 NOTE — Progress Notes (Signed)
      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   Rhina Brackett [place] 10/25/2019, 2:16 PM

## 2019-10-26 LAB — GLUCOSE, CAPILLARY
Glucose-Capillary: 156 mg/dL — ABNORMAL HIGH (ref 70–99)
Glucose-Capillary: 147 mg/dL — ABNORMAL HIGH (ref 70–99)
Glucose-Capillary: 92 mg/dL (ref 70–99)
Glucose-Capillary: 187 mg/dL — ABNORMAL HIGH (ref 70–99)

## 2019-10-26 LAB — AEROBIC/ANAEROBIC CULTURE W GRAM STAIN (SURGICAL/DEEP WOUND)

## 2019-10-26 NOTE — Progress Notes (Addendum)
  Progress Note    10/26/2019 9:56 AM 3 Days Post-Op  Subjective:  Got OOB to [facility] yesterday. +BM. No complaints of pain   Vitals:   10/25/19 2351 10/26/19 0751  BP: 135/78 139/89  Pulse: 73 73  Resp: 16 (!) 21  Temp: 98.6 F (37 C) 98.1 F (36.7 C)  SpO2: 99% 98%    Physical Exam: Cardiac:  RRR Lungs:  nonlabored Extremities:  left thigh wound is clean and pinking up Left foot wound clean and granulating. Has small <.5 cm ulceration of left lateral lower leg        CBC    Component Value Date/Time   WBC 15.0 (H) 10/25/2019 0725   RBC 2.73 (L) 10/25/2019 0725   HGB 7.6 (L) 10/25/2019 0725   HCT 23.2 (L) 10/25/2019 0725   PLT 245 10/25/2019 0725   MCV 85.0 10/25/2019 0725   MCH 27.8 10/25/2019 0725   MCHC 32.8 10/25/2019 0725   RDW 17.4 (H) 10/25/2019 0725   LYMPHSABS 1.9 10/25/2019 0725   MONOABS 0.5 10/25/2019 0725   EOSABS 0.1 10/25/2019 0725   BASOSABS 0.0 10/25/2019 0725    BMET    Component Value Date/Time   NA 135 10/25/2019 0725   K 4.7 10/25/2019 0725   CL 106 10/25/2019 0725   CO2 20 (L) 10/25/2019 0725   GLUCOSE 228 (H) 10/25/2019 0725   BUN 23 10/25/2019 0725   CREATININE 1.36 (H) 10/25/2019 0725   CALCIUM 8.3 (L) 10/25/2019 0725   GFRNONAA 53 (L) 10/25/2019 0725   GFRAA >60 10/25/2019 0725     Intake/Output Summary (Last 24 hours) at 10/26/2019 0956 Last data filed at 10/26/2019 0751 Gross per 24 hour  Intake 360 ml  Output 3400 ml  Net -3040 ml    HOSPITAL MEDICATIONS Scheduled Meds: . allopurinol  100 mg Oral Daily  . Chlorhexidine Gluconate Cloth  6 each Topical Daily  . clopidogrel  75 mg Oral Daily  . doxycycline  100 mg Oral Q12H  . ferrous sulfate  325 mg Oral Q breakfast  . insulin pump   Subcutaneous TID WC, HS, 0200  . pantoprazole  40 mg Oral QHS  . potassium chloride  20-40 mEq Oral Once  . pravastatin  40 mg Oral Q lunch  . pregabalin  50 mg Oral QHS   Continuous Infusions: . sodium chloride 999 mL/hr at  10/23/19 0557   PRN Meds:.acetaminophen **OR** acetaminophen, alum & mag hydroxide-simeth, bisacodyl, dextrose, guaiFENesin-dextromethorphan, hydrALAZINE, labetalol, metoprolol tartrate, morphine injection, ondansetron, oxyCODONE-acetaminophen, phenol, polyethylene glycol  Assessment:  69 y.o. male is s/p: Left thigh abscess s/p I and D on 4/28. Emergent return to OR for bleeding of LLE bypass graft due to infection4/30. Left thigh and left foot wounds are stable. VSS and afebrile  3 Days Post-Op  Plan: -? Remove blue vessel loop today -? VAC dressing to left thigh wound -Continue doxycyline for infection of LLE bypass -On Plavix -DM>> glucoses 156-243. Has insulin pump -OK to resume PT/OT services    Risa Grill, PA-C [facility] [phone] 10/26/2019  9:56 AM   I have independently interviewed and examined patient and agree with PA assessment and plan above. Blue vessel loop removed at bedside. Wound is clean. Possibly can place wound vac to transition home with. Plan will be home with po abx.  Jaquon Gingerich C. Donzetta Matters, MD [facility] [place] Office: [phone] Pager: [phone]

## 2019-10-27 LAB — BPAM RBC
Blood Product Expiration Date: 202105312359
Blood Product Expiration Date: 202105312359
Blood Product Expiration Date: 202106012359
Blood Product Expiration Date: 202106012359
Blood Product Expiration Date: 202106012359
Blood Product Expiration Date: 202106012359
Blood Product Expiration Date: 202106022359
ISSUE DATE / TIME: 202104300739
ISSUE DATE / TIME: 202104300739
ISSUE DATE / TIME: 202104300739
ISSUE DATE / TIME: 202104300841
Unit Type and Rh: 5100
Unit Type and Rh: 5100
Unit Type and Rh: 5100
Unit Type and Rh: 5100
Unit Type and Rh: 5100
Unit Type and Rh: 5100
Unit Type and Rh: 5100

## 2019-10-27 LAB — GLUCOSE, CAPILLARY
Glucose-Capillary: 111 mg/dL — ABNORMAL HIGH (ref 70–99)
Glucose-Capillary: 132 mg/dL — ABNORMAL HIGH (ref 70–99)
Glucose-Capillary: 137 mg/dL — ABNORMAL HIGH (ref 70–99)
Glucose-Capillary: 119 mg/dL — ABNORMAL HIGH (ref 70–99)
Glucose-Capillary: 205 mg/dL — ABNORMAL HIGH (ref 70–99)

## 2019-10-27 LAB — TYPE AND SCREEN
ABO/RH(D): O POS
Antibody Screen: NEGATIVE
Unit division: 0
Unit division: 0
Unit division: 0
Unit division: 0
Unit division: 0
Unit division: 0
Unit division: 0

## 2019-10-27 NOTE — Progress Notes (Addendum)
  Progress Note    10/27/2019 7:30 AM 4 Days Post-Op  Subjective:  Agreeable to wound vac therapy   Vitals:   10/26/19 2354 10/27/19 0522  BP: 138/83 131/84  Pulse: 70 77  Resp: (!) 25 18  Temp: 98.4 F (36.9 C) 98.6 F (37 C)  SpO2: 100% 96%   Physical Exam: Lungs:  Non labored Incisions:  L thigh incision with healthy wound bed, viable skin edges Extremities:  Feet symmetrically warm Neurologic: A&O  CBC    Component Value Date/Time   WBC 15.0 (H) 10/25/2019 0725   RBC 2.73 (L) 10/25/2019 0725   HGB 7.6 (L) 10/25/2019 0725   HCT 23.2 (L) 10/25/2019 0725   PLT 245 10/25/2019 0725   MCV 85.0 10/25/2019 0725   MCH 27.8 10/25/2019 0725   MCHC 32.8 10/25/2019 0725   RDW 17.4 (H) 10/25/2019 0725   LYMPHSABS 1.9 10/25/2019 0725   MONOABS 0.5 10/25/2019 0725   EOSABS 0.1 10/25/2019 0725   BASOSABS 0.0 10/25/2019 0725    BMET    Component Value Date/Time   NA 135 10/25/2019 0725   K 4.7 10/25/2019 0725   CL 106 10/25/2019 0725   CO2 20 (L) 10/25/2019 0725   GLUCOSE 228 (H) 10/25/2019 0725   BUN 23 10/25/2019 0725   CREATININE 1.36 (H) 10/25/2019 0725   CALCIUM 8.3 (L) 10/25/2019 0725   GFRNONAA 53 (L) 10/25/2019 0725   GFRAA >60 10/25/2019 0725    INR    Component Value Date/Time   INR 1.9 (H) 10/23/2019 0618     Intake/Output Summary (Last 24 hours) at 10/27/2019 0730 Last data filed at 10/27/2019 0500 Gross per 24 hour  Intake 960 ml  Output 1100 ml  Net -140 ml     Assessment/Plan:  69 y.o. male is s/p  Left thigh abscess s/p I and D on4/28. Emergent return to OR for bleeding of LLE bypass graft due to infection4/30 4 Days Post-Op   WOC RN consulted for wound vac placement L thigh TOC team consulted to arrange [facility] and home vac Continue doxycycline per ID OOB    Dagoberto Ligas, PA-C [facility] [phone] 10/27/2019 7:30 AM   I have independently interviewed and examined patient and agree with PA assessment and  plan above. Wound vac in place.   Milburn Freeney C. Donzetta Matters, MD [facility] [place] Office: [phone] Pager: [phone]

## 2019-10-27 NOTE — Plan of Care (Signed)
  Problem: Clinical Measurements: Goal: Cardiovascular complication will be avoided Outcome: Progressing   Problem: Clinical Measurements: Goal: Ability to maintain clinical measurements within normal limits will improve Outcome: Progressing   

## 2019-10-27 NOTE — Consult Note (Addendum)
[place] Nurse Consult Note: Reason for Consult: placement left thigh Wound type: surgical  Pressure Injury POA: NA Measurement: 14cm x 4.5cm x 3.5cm  Wound WGN:FAOZH subcutaneous tissue Drainage (amount, consistency, odor) moderate, serous on dressing Periwound:edematous  Dressing procedure/placement/frequency: Filled wound with  __1_ piece of black foam Sealed NPWT dressing at 136mm HG Patient received IV pain medication per bedside nurse prior to dressing change Patient tolerated procedure well Will discuss DC plans with [facility] M/W/F would be a more desired schedule for [facility]. Would suggest next change on Friday unless dc to home before.   [place], [place], [place]

## 2019-10-28 ENCOUNTER — Inpatient Hospital Stay (HOSPITAL_COMMUNITY): Payer: BC Managed Care – PPO

## 2019-10-28 LAB — GLUCOSE, CAPILLARY
Glucose-Capillary: 134 mg/dL — ABNORMAL HIGH (ref 70–99)
Glucose-Capillary: 149 mg/dL — ABNORMAL HIGH (ref 70–99)
Glucose-Capillary: 191 mg/dL — ABNORMAL HIGH (ref 70–99)
Glucose-Capillary: 127 mg/dL — ABNORMAL HIGH (ref 70–99)
Glucose-Capillary: 109 mg/dL — ABNORMAL HIGH (ref 70–99)
Glucose-Capillary: 152 mg/dL — ABNORMAL HIGH (ref 70–99)

## 2019-10-28 MED ORDER — HEPARIN SODIUM (PORCINE) 5000 UNIT/ML IJ SOLN
5000.0000 [IU] | Freq: Three times a day (TID) | INTRAMUSCULAR | Status: DC
  Administered 2019-10-28 – 2019-11-07 (×29): 5000 [IU] via SUBCUTANEOUS
  Filled 2019-10-28 (×30): qty 1

## 2019-10-28 MED ORDER — GADOBUTROL 1 MMOL/ML IV SOLN
10.0000 mL | Freq: Once | INTRAVENOUS | Status: AC | PRN
  Administered 2019-10-28: 10 mL via INTRAVENOUS

## 2019-10-28 NOTE — Progress Notes (Signed)
Left thigh wound measurements: Length: 16 cm Width: 7 cm Depth: 6 cm

## 2019-10-28 NOTE — Progress Notes (Addendum)
  Progress Note    10/28/2019 7:39 AM 5 Days Post-Op  Subjective:  Wound VAC to left thigh placed yesterday. No complaints this morning   Vitals:   10/28/19 0000 10/28/19 0428  BP: 129/79 131/75  Pulse: 74 65  Resp: (!) 22 11  Temp: 98.2 F (36.8 C) 98.3 F (36.8 C)  SpO2: 99% 98%    Physical Exam: Cardiac:  RRR Lungs:  nonlabored Extremities:  Left thigh VAC in place with good seal.  50cc serous drainage in canister.  Left foot ulcer unchanged. I cannot express any fluid.  Brisk left DP pulse by Doppler  CBC    Component Value Date/Time   WBC 15.0 (H) 10/25/2019 0725   RBC 2.73 (L) 10/25/2019 0725   HGB 7.6 (L) 10/25/2019 0725   HCT 23.2 (L) 10/25/2019 0725   PLT 245 10/25/2019 0725   MCV 85.0 10/25/2019 0725   MCH 27.8 10/25/2019 0725   MCHC 32.8 10/25/2019 0725   RDW 17.4 (H) 10/25/2019 0725   LYMPHSABS 1.9 10/25/2019 0725   MONOABS 0.5 10/25/2019 0725   EOSABS 0.1 10/25/2019 0725   BASOSABS 0.0 10/25/2019 0725  CBG>> 119-205  BMET    Component Value Date/Time   NA 135 10/25/2019 0725   K 4.7 10/25/2019 0725   CL 106 10/25/2019 0725   CO2 20 (L) 10/25/2019 0725   GLUCOSE 228 (H) 10/25/2019 0725   BUN 23 10/25/2019 0725   CREATININE 1.36 (H) 10/25/2019 0725   CALCIUM 8.3 (L) 10/25/2019 0725   GFRNONAA 53 (L) 10/25/2019 0725   GFRAA >60 10/25/2019 0725     Intake/Output Summary (Last 24 hours) at 10/28/2019 0739 Last data filed at 10/28/2019 0218 Gross per 24 hour  Intake --  Output 1875 ml  Net -1875 ml    HOSPITAL MEDICATIONS Scheduled Meds: . allopurinol  100 mg Oral Daily  . Chlorhexidine Gluconate Cloth  6 each Topical Daily  . clopidogrel  75 mg Oral Daily  . doxycycline  100 mg Oral Q12H  . ferrous sulfate  325 mg Oral Q breakfast  . insulin pump   Subcutaneous TID WC, HS, 0200  . pantoprazole  40 mg Oral QHS  . pravastatin  40 mg Oral Q lunch  . pregabalin  50 mg Oral QHS   Continuous Infusions: PRN Meds:.acetaminophen **OR**  acetaminophen, alum & mag hydroxide-simeth, bisacodyl, dextrose, guaiFENesin-dextromethorphan, hydrALAZINE, labetalol, metoprolol tartrate, morphine injection, ondansetron, oxyCODONE-acetaminophen, phenol, polyethylene glycol  Assessment:  69 y.o. male is s/p: Left thigh abscess s/p I and D on4/28. Emergent return to OR for bleeding of LLE bypass graft due to infection4/30.  Left thigh wound now with VAC dressing. VSS and afebrile  Hgb down today to 7.6 . Post-op Hgb = 6.8 transfused 4u PCs >> 8.4. Remains on Plavix without evidence of bleeding   5 Days Post-Op  Plan: -MRI of left foot today. -Home when [facility] arrangements in place. Continue doxycycline for 2 weeks total. home Wound VAC changes M/W/F -    Risa Grill, PA-C [facility] [phone] 10/28/2019  7:39 AM.  I have independently interviewed and examined patient and agree with PA assessment and plan above.  He has persistent drainage from the left foot.  Left thigh wound VAC is in place and the thigh is soft.  We will check labs tomorrow.  MRI of left foot today to evaluate for underlying osteomyelitis.  Jawanza Zambito C. Donzetta Matters, MD [facility] [place] Office: [phone] Pager: [phone]

## 2019-10-28 NOTE — TOC Progression Note (Signed)
Transition of Care (TOC) - Progression Note  [name] RN, BSN Transitions of Care Unit 4E- RN Case Manager [phone]   Patient Details  Name: [name] MRN: [id number] Date of Birth: Feb 05, 1951  Transition of Care [facility]) CM/SW Contact  Dahlia Client, Romeo Rabon, RN Phone Number: 10/28/2019, 3:52 PM  Clinical Narrative:    Cm has been following pt for transition of care needs- [employer] home wound VAC formed signed yesterday and faxed to Olivia Mackie with [employer]- home wound VAC has been approved and has been delivered to the room for when pt ready to transition home (bedside RN will transition pt to home VAC at the bedside)- spoke with pt today and discussed [facility] care [place] still has RN staffing shortage, [facility] was able to accept pt last week and as per conversation with Tommi Rumps they are still able to assist pt now with wound VAC care needs. Discussed with pt possible delay in start of care will check with [facility].   Call made to [facility] to check on start of care dates to see what would be available nearing the end of week. Per Meredeth Ide would not be able to do a start of care on Friday May 7- but would be able to do a nursing start of care for Monday May 10. In light of this - pt would need to stay here and have his VAC drsg changed on Friday and then could be discharged if medically ready after that with [facility] starting on Monday.  TOC to continue to follow for transition of care needs.   Expected Discharge Plan: [place] Barriers to Discharge: Continued Medical Work up  Expected Discharge Plan and Services Expected Discharge Plan: [place]   Discharge Planning Services: CM Consult Post Acute Care Choice: [place] arrangements for the past 2 months: Single Family Home                 DME Arranged: Vac DME Agency: [employer] Date DME Agency Contacted: 10/27/19 Time DME Agency Contacted: [id number] Representative spoke with at DME Agency: [place]:  RN, IV Antibiotics [place]: [place] Date [place]: 10/22/19 Time [place]: 1330 Representative spoke with at [place]: [place] ([place]) Interventions    Readmission Risk Interventions Readmission Risk Prevention Plan 05/09/2018 05/09/2018  Transportation Screening - Complete  PCP or Specialist Appt within 5-7 Days Complete -  Home Care Screening Complete -  Medication Review (RN CM) Complete -  Some recent data might be hidden

## 2019-10-29 LAB — CBC
HCT: 24.9 % — ABNORMAL LOW (ref 39.0–52.0)
Hemoglobin: 7.9 g/dL — ABNORMAL LOW (ref 13.0–17.0)
MCH: 27.4 pg (ref 26.0–34.0)
MCHC: 31.7 g/dL (ref 30.0–36.0)
MCV: 86.5 fL (ref 80.0–100.0)
Platelets: 359 10*3/uL (ref 150–400)
RBC: 2.88 MIL/uL — ABNORMAL LOW (ref 4.22–5.81)
RDW: 17.2 % — ABNORMAL HIGH (ref 11.5–15.5)
WBC: 11.8 10*3/uL — ABNORMAL HIGH (ref 4.0–10.5)
nRBC: 0 % (ref 0.0–0.2)

## 2019-10-29 LAB — BASIC METABOLIC PANEL
Anion gap: 12 (ref 5–15)
BUN: 15 mg/dL (ref 8–23)
CO2: 19 mmol/L — ABNORMAL LOW (ref 22–32)
Calcium: 8.5 mg/dL — ABNORMAL LOW (ref 8.9–10.3)
Chloride: 105 mmol/L (ref 98–111)
Creatinine, Ser: 1.25 mg/dL — ABNORMAL HIGH (ref 0.61–1.24)
GFR calc Af Amer: >60 mL/min (ref >60)
GFR calc non Af Amer: 59 mL/min — ABNORMAL LOW (ref >60)
Glucose, Bld: 170 mg/dL — ABNORMAL HIGH (ref 70–99)
Potassium: 4 mmol/L (ref 3.5–5.1)
Sodium: 136 mmol/L (ref 135–145)

## 2019-10-29 LAB — GLUCOSE, CAPILLARY
Glucose-Capillary: 166 mg/dL — ABNORMAL HIGH (ref 70–99)
Glucose-Capillary: 121 mg/dL — ABNORMAL HIGH (ref 70–99)
Glucose-Capillary: 140 mg/dL — ABNORMAL HIGH (ref 70–99)

## 2019-10-29 MED ORDER — HYDROCODONE-ACETAMINOPHEN 5-325 MG PO TABS
1.0000 | ORAL_TABLET | Freq: Four times a day (QID) | ORAL | Status: DC | PRN
  Administered 2019-10-30 – 2019-10-31 (×2): 2 via ORAL
  Administered 2019-11-01 – 2019-11-07 (×8): 1 via ORAL
  Filled 2019-10-29 (×3): qty 1
  Filled 2019-10-29 (×2): qty 2
  Filled 2019-10-29 (×2): qty 1
  Filled 2019-10-29: qty 2
  Filled 2019-10-29 (×2): qty 1
  Filled 2019-10-29: qty 2
  Filled 2019-10-29 (×2): qty 1

## 2019-10-29 NOTE — Evaluation (Signed)
Physical Therapy Evaluation Patient Details Name: [name] MRN: [id number] DOB: [birth year] Today's Date: 10/29/2019   History of Present Illness  Pt is a 69 y/o male admitted secondary to abscess in LLE; s/p drainage. Pt also with new bleeding of LLE graft and is s/p emergent repair of bypass graft. PMH includes DM, CKD, HTN, and sleep apnea on bipap.   Clinical Impression  Pt is s/p surgery above with deficits below. Pt with increased difficulty with weightbearing on LLE and lacking knee extension during stance phase. Pt fatiguing and requiring seated rests during mobility tasks. Feel pt would benefit from CIR level therapies to increase independence and safety prior to return home. Will continue to follow acutely to maximize functional mobility independence and safety.     Follow Up Recommendations CIR    Equipment Recommendations  None recommended by PT    Recommendations for Other Services OT consult     Precautions / Restrictions Precautions Precautions: Fall Restrictions Weight Bearing Restrictions: Yes RLE Weight Bearing: Weight bearing as tolerated LLE Weight Bearing: Weight bearing as tolerated      Mobility  Bed Mobility Overal bed mobility: Needs Assistance Bed Mobility: Supine to Sit;Sit to Supine     Supine to sit: Supervision Sit to supine: Supervision   General bed mobility comments: Supervision for safety and line management.   Transfers Overall transfer level: Needs assistance Equipment used: Rolling walker (2 wheeled) Transfers: Sit to/from Stand Sit to Stand: Min guard;From elevated surface         General transfer comment: Min guard for steadying assist from elevated surface. REquired extended seated rests during mobility and performed sit<>Stand X4-5 throughout.   Ambulation/Gait Ambulation/Gait assistance: Min guard Gait Distance (Feet): 15 Feet(X2) Assistive device: Rolling walker (2 wheeled) Gait Pattern/deviations: Step-to  pattern;Decreased step length - left;Decreased step length - right;Decreased weight shift to left;Antalgic Gait velocity: Decreased   General Gait Details: Slow, antalgic gait. Pt unable to fully extend L knee during stance phase. Pt requiring seated rest in middle of gait secondary to fatigue.   Stairs            Wheelchair Mobility    Modified Rankin (Stroke Patients Only)       Balance Overall balance assessment: Needs assistance Sitting-balance support: No upper extremity supported;Feet supported Sitting balance-Leahy Scale: Good     Standing balance support: Bilateral upper extremity supported;During functional activity Standing balance-Leahy Scale: Poor Standing balance comment: Reliant on BUE support                              Pertinent Vitals/Pain Pain Assessment: Faces Faces Pain Scale: Hurts even more Pain Location: L knee tightness Pain Descriptors / Indicators: Tightness;Aching;Grimacing;Guarding Pain Intervention(s): Limited activity within patient's tolerance;Monitored during session;Repositioned    Home Living Family/patient expects to be discharged to:: Private residence Living Arrangements: Spouse/significant other Available Help at Discharge: Family Type of Home: House Home Access: Stairs to enter   Technical brewer of Steps: 1 Home Layout: Two level Home Equipment: Environmental consultant - 2 wheels;Bedside commode;Wheelchair - manual;Cane - single point      Prior Function Level of Independence: Independent with assistive device(s)         Comments: Reports using a cane.      Hand Dominance   Dominant Hand: Right    Extremity/Trunk Assessment   Upper Extremity Assessment Upper Extremity Assessment: Defer to OT evaluation    Lower Extremity Assessment Lower Extremity  Assessment: LLE deficits/detail LLE Deficits / Details: Increased tightness in L knee with inability to extend fully.     Cervical / Trunk  Assessment Cervical / Trunk Assessment: Normal  Communication   Communication: No difficulties  Cognition Arousal/Alertness: Awake/alert Behavior During Therapy: WFL for tasks assessed/performed Overall Cognitive Status: Within Functional Limits for tasks assessed                                 General Comments: Pt slightly frustrated during session because he feels like he has declined in his mobility from his previous level. Somewhat tearful at times throughout. Provided encouragement for pt.       General Comments      Exercises     Assessment/Plan    PT Assessment Patient needs continued PT services  PT Problem List Decreased strength;Decreased balance;Decreased mobility;Decreased knowledge of use of DME       PT Treatment Interventions Gait training;DME instruction;Stair training;Therapeutic exercise;Therapeutic activities;Functional mobility training;Balance training;Patient/family education    PT Goals (Current goals can be found in the Care Plan section)  Acute Rehab PT Goals Patient Stated Goal: to be able to walk better before going home PT Goal Formulation: With patient Time For Goal Achievement: 11/12/19 Potential to Achieve Goals: Good    Frequency Min 3X/week   Barriers to discharge        Co-evaluation               AM-PAC PT "6 Clicks" Mobility  Outcome Measure Help needed turning from your back to your side while in a flat bed without using bedrails?: None Help needed moving from lying on your back to sitting on the side of a flat bed without using bedrails?: None Help needed moving to and from a bed to a chair (including a wheelchair)?: A Little Help needed standing up from a chair using your arms (e.g., wheelchair or bedside chair)?: A Little Help needed to walk in hospital room?: A Little Help needed climbing 3-5 steps with a railing? : A Lot 6 Click Score: 19    End of Session Equipment Utilized During Treatment: Gait  belt Activity Tolerance: Patient tolerated treatment well Patient left: in bed;with call bell/phone within reach Nurse Communication: Mobility status;Other (comment)(pt had BM during session ) PT Visit Diagnosis: Unsteadiness on feet (R26.81);Muscle weakness (generalized) (M62.81)    Time: 5885-0277 PT Time Calculation (min) (ACUTE ONLY): 41 min   Charges:   PT Evaluation $PT Eval Moderate Complexity: 1 Mod PT Treatments $Gait Training: 8-22 mins $Therapeutic Activity: 8-22 mins        Lou Miner, DPT  Acute Rehabilitation Services  Pager: [phone] Office: [phone]   Rudean Hitt 10/29/2019, 3:17 PM

## 2019-10-29 NOTE — Progress Notes (Addendum)
Progress Note    10/29/2019 7:33 AM 6 Days Post-Op  Subjective:  States he noticed small skin ulcer right lower leg, o/w no complaints  Has home wound VAC system in room  Vitals:   10/28/19 2334 10/29/19 0255  BP: (!) 145/80 140/81  Pulse: 73 78  Resp: 20 (!) 25  Temp: 98.4 F (36.9 C) 98.6 F (37 C)  SpO2: 99% 100%    Physical Exam: Cardiac:  RRR Lungs:  nonlabored Extremities:  DP Doppler signals bilaterally.  0.5 cm anterior right lower leg ulcer.  Area of epidermolysis of left lateral leg.   Left lateral foot ulcer dryer without drainage.   Left thigh wound with VAC in place with good seal. Mininal serous drainage  Left foot   Left lower leg   Right anterior lower leg    CBC    Component Value Date/Time   WBC 11.8 (H) 10/29/2019 0226   RBC 2.88 (L) 10/29/2019 0226   HGB 7.9 (L) 10/29/2019 0226   HCT 24.9 (L) 10/29/2019 0226   PLT 359 10/29/2019 0226   MCV 86.5 10/29/2019 0226   MCH 27.4 10/29/2019 0226   MCHC 31.7 10/29/2019 0226   RDW 17.2 (H) 10/29/2019 0226   LYMPHSABS 1.9 10/25/2019 0725   MONOABS 0.5 10/25/2019 0725   EOSABS 0.1 10/25/2019 0725   BASOSABS 0.0 10/25/2019 0725    BMET    Component Value Date/Time   NA 136 10/29/2019 0226   K 4.0 10/29/2019 0226   CL 105 10/29/2019 0226   CO2 19 (L) 10/29/2019 0226   GLUCOSE 170 (H) 10/29/2019 0226   BUN 15 10/29/2019 0226   CREATININE 1.25 (H) 10/29/2019 0226   CALCIUM 8.5 (L) 10/29/2019 0226   GFRNONAA 59 (L) 10/29/2019 0226   GFRAA >60 10/29/2019 0226     Intake/Output Summary (Last 24 hours) at 10/29/2019 0733 Last data filed at 10/29/2019 0648 Gross per 24 hour  Intake 300 ml  Output 1000 ml  Net -700 ml    HOSPITAL MEDICATIONS Scheduled Meds: . allopurinol  100 mg Oral Daily  . Chlorhexidine Gluconate Cloth  6 each Topical Daily  . clopidogrel  75 mg Oral Daily  . doxycycline  100 mg Oral Q12H  . ferrous sulfate  325 mg Oral Q breakfast  . heparin injection  (subcutaneous)  5,000 Units Subcutaneous Q8H  . insulin pump   Subcutaneous TID WC, HS, 0200  . pantoprazole  40 mg Oral QHS  . pravastatin  40 mg Oral Q lunch  . pregabalin  50 mg Oral QHS   Continuous Infusions: PRN Meds:.acetaminophen **OR** acetaminophen, alum & mag hydroxide-simeth, bisacodyl, dextrose, guaiFENesin-dextromethorphan, hydrALAZINE, labetalol, metoprolol tartrate, morphine injection, ondansetron, oxyCODONE-acetaminophen, phenol, polyethylene glycol  Assessment:  69 y.o. male is s/p: Leftthighabscess s/p I and D on4/28. Emergent return to OR for bleeding of LLE bypass graft due to infection4/30. Continue downward trend in Cr and WBCs.  Right foot ulcer.  Appears improved to me today.  His left lower extremity stays in left hip external rotation position.  We discussed offloading and he states he has a boot at home in which to float his left foot.  I encouraged him to use this when in bed.  Chronic venous stasis disease. Small ulcers without signs of cellulitis   6 Days Post-Op  Plan: Continue VAC, ABx - -DVT prophylaxis:  Heparin [place]   Risa Grill, PA-C [facility] [phone] 10/29/2019  7:33 AM   I have independently interviewed and  examined patient and agree with PA assessment and plan above. MRI negative for osteo or fluid collection. Continue abx. Planning wound vac change and possible dc in next day or so.  Jhayla Podgorski C. Donzetta Matters, MD [facility] [place] Office: [phone] Pager: [phone]  Addendum: Patient is having pain in the left leg we will evaluate with duplex.  I have also change his pain medicine to hydrocodone from oxycodone.  PT has evaluated and is recommending CIR.  [name]

## 2019-10-29 NOTE — Progress Notes (Signed)
Rehab Admissions Coordinator Note:  Per PT recommendation, this patient was screened by Raechel Ache for appropriateness for an Inpatient Acute Rehab Consult.  Noted pt is already Min G for transfers and ambulation on eval. Anticipate quick progress given WB status. Will follow for additional therapy session. If pt does not progress in the next PT session will pursue CIR consult. *If pt continues to have functional PT needs, please have attending service place OT order/eval as this is necessary for CIR assessment and insurance authorization.   Raechel Ache 10/29/2019, 5:17 PM  I can be reached at [phone].

## 2019-10-30 ENCOUNTER — Inpatient Hospital Stay (HOSPITAL_COMMUNITY): Payer: BC Managed Care – PPO

## 2019-10-30 DIAGNOSIS — R52 Pain, unspecified: Secondary | ICD-10-CM

## 2019-10-30 LAB — GLUCOSE, CAPILLARY
Glucose-Capillary: 161 mg/dL — ABNORMAL HIGH (ref 70–99)
Glucose-Capillary: 105 mg/dL — ABNORMAL HIGH (ref 70–99)
Glucose-Capillary: 166 mg/dL — ABNORMAL HIGH (ref 70–99)
Glucose-Capillary: 75 mg/dL (ref 70–99)
Glucose-Capillary: 111 mg/dL — ABNORMAL HIGH (ref 70–99)
Glucose-Capillary: 99 mg/dL (ref 70–99)

## 2019-10-30 NOTE — Progress Notes (Signed)
Left lower extremity bypass graft duplex completed. Refer to "CV Proc" under chart review to view preliminary results.  10/30/2019 12:32 PM Kelby Aline., MHA, RVT, RDCS, RDMS

## 2019-10-30 NOTE — TOC Progression Note (Addendum)
Transition of Care (TOC) - Progression Note  [name] RN, BSN Transitions of Care Unit 4E- RN Case Manager [phone]   Patient Details  Name: [name] MRN: [id number] Date of Birth: May 13, 1951  Transition of [facility]) CM/SW Contact  Dahlia Client, Romeo Rabon, RN Phone Number: 10/30/2019, 10:03 AM  Clinical Narrative:    Noted PT recommendation for CIR- had spoken with pt and wife regarding this recommendation on 5/6 late afternoon, [facility] INPT rehab does not have any bed availability until sometime next week. Discussed INPT rehab options of [facility] or [facility] with pt vs STSNF rehab. Per pt and wife pt has been at both [facility] and [facility] in the past. Pt and wife voiced understanding that once pt is medically stable for transition it is not an option to wait here for a INPT rehab bed to become available and are agreeable to look at other options. They would like CM to look into [facility] INPT rehab- call was made this AM- and spoke with [facility]- they also do not have any beds available until sometime mid next week.  Spoke again to pt this am and provided list for STSNF rehabs for his review- will plan to fax out to [facility] and [facility] and will check back with pt to see if there are any others he wants [place] to check into. Also discussed with pt that if he progresses with PT he could also potentially return home with HH vs STSNF.  Spoke with [facility] vascular PA who feels like pt should be medically stable for transition by Monday May 10.   Update 1300- f/u done with pt regarding STSNF choice- per pt he did not see any other places he wanted [place] to fax out to so will fax out to just [facility] and [facility] for now- did advise pt to continue to look at list for alternate places just in case his first choice do not have bed availability or do not over a bed. FL2 completed and faxed out to pt's selected facilities. Will have weekend staff f/u on bed offers   Expected Discharge  Plan: [place] Barriers to Discharge: Continued Medical Work up  Expected Discharge Plan and Services Expected Discharge Plan: [place]   Discharge Planning Services: CM Consult Post Acute Care Choice: [place] arrangements for the past 2 months: Single Family Home                 DME Arranged: Vac DME Agency: [employer] Date DME Agency Contacted: 10/27/19 Time DME Agency Contacted: 1517 Representative spoke with at DME Agency: Olivia Mackie HH Arranged: RN, IV Antibiotics [place]: [place] Date [place]: 10/22/19 Time [place]: 1330 Representative spoke with at [place]: [place] Interventions    Readmission Risk Interventions Readmission Risk Prevention Plan 05/09/2018 05/09/2018  Transportation Screening - Complete  PCP or Specialist Appt within 5-7 Days Complete -  Home Care Screening Complete -  Medication Review (RN CM) Complete -  Some recent data might be hidden

## 2019-10-30 NOTE — Progress Notes (Addendum)
  Progress Note    10/30/2019 8:01 AM 7 Days Post-Op  Subjective:  Pain in L proximal calf   Vitals:   10/30/19 0325 10/30/19 0615  BP: 135/79 (!) 103/59  Pulse: 79 75  Resp: 15 17  Temp: 99.4 F (37.4 C) 99.6 F (37.6 C)  SpO2: 100% 100%   Physical Exam Lungs:  Non labored Incisions:  L thigh with vac in place Extremities:  L ATA brisk by doppler Neurologic: A&O  CBC    Component Value Date/Time   WBC 11.8 (H) 10/29/2019 0226   RBC 2.88 (L) 10/29/2019 0226   HGB 7.9 (L) 10/29/2019 0226   HCT 24.9 (L) 10/29/2019 0226   PLT 359 10/29/2019 0226   MCV 86.5 10/29/2019 0226   MCH 27.4 10/29/2019 0226   MCHC 31.7 10/29/2019 0226   RDW 17.2 (H) 10/29/2019 0226   LYMPHSABS 1.9 10/25/2019 0725   MONOABS 0.5 10/25/2019 0725   EOSABS 0.1 10/25/2019 0725   BASOSABS 0.0 10/25/2019 0725    BMET    Component Value Date/Time   NA 136 10/29/2019 0226   K 4.0 10/29/2019 0226   CL 105 10/29/2019 0226   CO2 19 (L) 10/29/2019 0226   GLUCOSE 170 (H) 10/29/2019 0226   BUN 15 10/29/2019 0226   CREATININE 1.25 (H) 10/29/2019 0226   CALCIUM 8.5 (L) 10/29/2019 0226   GFRNONAA 59 (L) 10/29/2019 0226   GFRAA >60 10/29/2019 0226    INR    Component Value Date/Time   INR 1.9 (H) 10/23/2019 0618     Intake/Output Summary (Last 24 hours) at 10/30/2019 0801 Last data filed at 10/30/2019 0618 Gross per 24 hour  Intake 120 ml  Output 1900 ml  Net -1780 ml     Assessment/Plan:  69 y.o. male is s/p Leftthighabscess s/p I and D on4/28. Emergent return to OR for bleeding of LLE bypass graft due to infection4/30 7 Days Post-Op   L ATA signal by doppler Check duplex for possible fluid collection L popliteal space Vac change today with WOC RN PT recommending CIR   Dagoberto Ligas, PA-C [facility] [phone] 10/30/2019 8:01 AM   I have independently interviewed and examined patient and agree with PA assessment and plan above. F/u duplex. PT to work with  today possibly will need CIR.   Rolena Knutson C. Donzetta Matters, MD [facility] [place] Office: [phone] Pager: [phone]

## 2019-10-30 NOTE — Progress Notes (Signed)
Physical Therapy Treatment Patient Details Name: CASSELL VOORHIES MRN: [id number] DOB: 11/08/1950 Today's Date: 10/30/2019    History of Present Illness Pt is a 69 y/o male admitted secondary to abscess in LLE; s/p drainage. Pt also with new bleeding of LLE graft and is s/p emergent repair of bypass graft. PMH includes DM, CKD, HTN, and sleep apnea on bipap.     PT Comments    Patient progressing slowly towards PT goals. Improved ambulation distance but continues to have difficulty placing left foot on ground during stance phase. Noted to fatigue quickly with 2/4 DOE with mobility. Requires extended seated rest breaks, "this is just so hard."  Instructed pt in heel cord stretch using sheet. Reports left knee tightness, weakness and pain. Continue to recommend CIR. Will follow.   Follow Up Recommendations  CIR     Equipment Recommendations  None recommended by PT    Recommendations for Other Services       Precautions / Restrictions Precautions Precautions: Fall Precaution Comments: wound vac Restrictions Weight Bearing Restrictions: No    Mobility  Bed Mobility Overal bed mobility: Needs Assistance Bed Mobility: Supine to Sit     Supine to sit: Supervision;HOB elevated     General bed mobility comments: Increased time and supervision for lines.  Transfers Overall transfer level: Needs assistance Equipment used: Rolling walker (2 wheeled) Transfers: Sit to/from Stand Sit to Stand: Min guard;Min assist         General transfer comment: Initially Min A to steady in standing; stood half way up and returned to sitting due to tightness in left calf; stood from EOB x2, from chair x2. Transferred to recliner post session.  Ambulation/Gait Ambulation/Gait assistance: Min guard;Min assist Gait Distance (Feet): 15 Feet(+20' +12') Assistive device: Rolling walker (2 wheeled) Gait Pattern/deviations: Step-to pattern;Decreased step length - left;Decreased step length -  right;Decreased weight shift to left;Antalgic Gait velocity: Decreased   General Gait Details: Slow, antalgic gait. Pt unable to fully extend L knee during stance phase. Pt requiring mutliple seated rests during ambulation, attempting to lean on walker due to fatigue. 2/4 DOE.   Stairs             Wheelchair Mobility    Modified Rankin (Stroke Patients Only)       Balance Overall balance assessment: Needs assistance Sitting-balance support: No upper extremity supported;Feet supported Sitting balance-Leahy Scale: Good     Standing balance support: During functional activity Standing balance-Leahy Scale: Poor Standing balance comment: Reliant on BUE support                             Cognition Arousal/Alertness: Awake/alert Behavior During Therapy: WFL for tasks assessed/performed Overall Cognitive Status: Within Functional Limits for tasks assessed                                 General Comments: Pt easily frustrated during session, "I feel like I am moving backwards." Requires encouragement. Gets irritated, "I can't do that! so stop telling me to fo something i cannot do" when asked to try to place his left foot on the ground.      Exercises Other Exercises Other Exercises: instructed pt in heel cord stretch with sheet, ~30 sec x2.    General Comments        Pertinent Vitals/Pain Pain Assessment: Faces Faces Pain Scale: Hurts even more Pain Location:  L knee tightness Pain Descriptors / Indicators: Tightness;Aching;Grimacing;Guarding Pain Intervention(s): Repositioned;Monitored during session;Limited activity within patient's tolerance    Home Living                      Prior Function            PT Goals (current goals can now be found in the care plan section) Progress towards PT goals: Progressing toward goals    Frequency    Min 3X/week      PT Plan Current plan remains appropriate    Co-evaluation               AM-PAC PT "6 Clicks" Mobility   Outcome Measure  Help needed turning from your back to your side while in a flat bed without using bedrails?: None Help needed moving from lying on your back to sitting on the side of a flat bed without using bedrails?: None Help needed moving to and from a bed to a chair (including a wheelchair)?: A Little Help needed standing up from a chair using your arms (e.g., wheelchair or bedside chair)?: A Little Help needed to walk in hospital room?: A Little Help needed climbing 3-5 steps with a railing? : A Lot 6 Click Score: 19    End of Session Equipment Utilized During Treatment: Gait belt Activity Tolerance: Patient tolerated treatment well;Patient limited by fatigue Patient left: in chair;with call bell/phone within reach Nurse Communication: Mobility status PT Visit Diagnosis: Unsteadiness on feet (R26.81);Muscle weakness (generalized) (M62.81);Pain Pain - Right/Left: Left Pain - part of body: Leg     Time: 0623-7628 PT Time Calculation (min) (ACUTE ONLY): 31 min  Charges:  $Gait Training: 23-37 mins                     Marisa Severin, PT, DPT Acute Rehabilitation Services Pager [phone] Office [place] 10/30/2019, 3:47 PM

## 2019-10-30 NOTE — Consult Note (Signed)
[place] Nurse Consult Note: Patient receiving care in [place]. Reason for Consult: left thigh VAC change Wound type: surgical Pressure Injury POA: Yes/No/NA Measurement: 16 cm x 5 cm x 1.1 cm Wound bed: 100% pink Drainage (amount, consistency, odor) serous in cannister Periwound: intact Dressing procedure/placement/frequency: one piece of black foam removed, one piece placed. Immediate seal obtained. Patient tolerated well by focusing on breathing. Val Riles, RN, MSN, CWOCN, CNS-BC, pager [phone]

## 2019-10-30 NOTE — Progress Notes (Signed)
Inpatient Rehabilitation-Admissions Coordinator   Per PT note, this pt continues to have functional deficits and is progressing slowly; they continue to recommend CIR. AC will contact attending service to recommend OT order as their eval will be needed for insurance authorization process if pt is to be considered for CIR. AC will contact MD to request consult order.   Raechel Ache, OTR/L  Rehab Admissions Coordinator  [phone] 10/30/2019 5:17 PM

## 2019-10-31 LAB — GLUCOSE, CAPILLARY
Glucose-Capillary: 112 mg/dL — ABNORMAL HIGH (ref 70–99)
Glucose-Capillary: 154 mg/dL — ABNORMAL HIGH (ref 70–99)
Glucose-Capillary: 185 mg/dL — ABNORMAL HIGH (ref 70–99)
Glucose-Capillary: 161 mg/dL — ABNORMAL HIGH (ref 70–99)
Glucose-Capillary: 196 mg/dL — ABNORMAL HIGH (ref 70–99)
Glucose-Capillary: 141 mg/dL — ABNORMAL HIGH (ref 70–99)

## 2019-10-31 NOTE — Progress Notes (Signed)
   VASCULAR SURGERY ASSESSMENT & PLAN:   POD 8 REPAIR OF LEFT LOWER EXTREMITY BYPASS FOR BLEEDING: The patient underwent drainage of a left thigh abscess on 10/21/2019.  He had bleeding on 10/23/2019 and was taken to the operating room for repair of the graft.  He has a wound VAC on his medial left leg.  He had a duplex scan yesterday that shows that his bypass graft is patent.  There is a mass in the mid thigh possibly consistent with a hematoma.  There is also a mass behind the knee possibly consistent with a Baker's cyst.  ID: The abscess from the left leg grew moderate methicillin-resistant staph aureus.  He is on po doxycycline.  ANEMIA: His hemoglobin is stable at 7.9  SUBJECTIVE:   No complaints this morning.  PHYSICAL EXAM:   Vitals:   10/30/19 2031 10/31/19 0033 10/31/19 0407 10/31/19 0819  BP: 114/76 125/70 119/83 128/70  Pulse: 78 78 81 78  Resp: 20 19 17    Temp: 98.1 F (36.7 C) 98.5 F (36.9 C) 98.2 F (36.8 C) 98.1 F (36.7 C)  TempSrc: Oral Oral Oral Oral  SpO2: 98% 97% 100% 99%  Weight:      Height:       He has an anterior tibial and dorsalis pedis signal with a Doppler. The VAC has a good seal.  LABS:   Lab Results  Component Value Date   WBC 11.8 (H) 10/29/2019   HGB 7.9 (L) 10/29/2019   HCT 24.9 (L) 10/29/2019   MCV 86.5 10/29/2019   PLT 359 10/29/2019   Lab Results  Component Value Date   CREATININE 1.25 (H) 10/29/2019   Lab Results  Component Value Date   INR 1.9 (H) 10/23/2019   CBG (last 3)  Recent Labs    10/30/19 1601 10/31/19 0409 10/31/19 0817  GLUCAP 99 112* 185*    PROBLEM LIST:    Principal Problem:   Wound infection after surgery Active Problems:   Diabetes mellitus type 2 in obese (HCC)   CKD (chronic kidney disease) stage 3, GFR 30-59 ml/min   Ischemic leg   Staphylococcus aureus infection   Hemorrhage   CURRENT MEDS:   . allopurinol  100 mg Oral Daily  . Chlorhexidine Gluconate Cloth  6 each Topical Daily  .  clopidogrel  75 mg Oral Daily  . doxycycline  100 mg Oral Q12H  . ferrous sulfate  325 mg Oral Q breakfast  . heparin injection (subcutaneous)  5,000 Units Subcutaneous Q8H  . insulin pump   Subcutaneous TID WC, HS, 0200  . pantoprazole  40 mg Oral QHS  . pravastatin  40 mg Oral Q lunch  . pregabalin  50 mg Oral QHS    Deitra Mayo Office: [phone] 10/31/2019

## 2019-11-01 LAB — GLUCOSE, CAPILLARY
Glucose-Capillary: 96 mg/dL (ref 70–99)
Glucose-Capillary: 105 mg/dL — ABNORMAL HIGH (ref 70–99)
Glucose-Capillary: 128 mg/dL — ABNORMAL HIGH (ref 70–99)
Glucose-Capillary: 47 mg/dL — ABNORMAL LOW (ref 70–99)
Glucose-Capillary: 54 mg/dL — ABNORMAL LOW (ref 70–99)
Glucose-Capillary: 114 mg/dL — ABNORMAL HIGH (ref 70–99)

## 2019-11-01 NOTE — TOC Progression Note (Addendum)
Transition of Care [facility]) - Progression Note    Patient Details  Name: [name] MRN: [id number] Date of Birth: 19-Jan-1951  Transition of Care [facility]) CM/SW [place], [place] Phone Number: [phone] 11/01/2019, 12:25 PM  Clinical Narrative:     CSW checked hub again and there are no bed offers. CSW is aware that the admission coordinator for [facility] is on vacation today therefore did not follow up with them. CSW called [facility] and had to leave a message.  CSW attempted to contact patient via phone and was not able to reach him. CSW will try back later today to ascertain if he willing to entertain other SNF options.  4:30PM- CSW spoke with patient's wife due to patient being sleep and she explained that their top choice is [facility] and then [facility]. CSW explained that he did not have an offer from [facility] nor [facility] as of yet. CSW inquired if referrals could be sent to other facilities and she stated no. They did not want to pursue any other facilities.  TOC team will continue to assist with discharge planning needs.  Expected Discharge Plan: [place] Barriers to Discharge: Continued Medical Work up  Expected Discharge Plan and Services Expected Discharge Plan: [place]   Discharge Planning Services: CM Consult Post Acute Care Choice: [place] arrangements for the past 2 months: Single Family Home                 DME Arranged: Vac DME Agency: [facility] Date DME Agency Contacted: 10/27/19 Time DME Agency Contacted: [id number] Representative spoke with at DME Agency: [place]: RN, IV Antibiotics [place]: [place] Date [place]: 10/22/19 Time [place]: 1330 Representative spoke with at [place]: [place] ([place]) Interventions    Readmission Risk Interventions Readmission Risk Prevention Plan 05/09/2018 05/09/2018   Transportation Screening - Complete  PCP or Specialist Appt within 5-7 Days Complete -  Home Care Screening Complete -  Medication Review (RN CM) Complete -  Some recent data might be hidden

## 2019-11-01 NOTE — Progress Notes (Addendum)
Does not use it, systolic on his edges nasty wounds right actually looks pretty good.  Of any get as much of an allergy can is really stuck on having a closed right closed with a very challenging wound Progress Note    11/01/2019 8:48 AM 9 Days Post-Op  Subjective:  No new complaints.  Popliteal fossa discomfort improved   Vitals:   10/31/19 2328 11/01/19 0329  BP: 117/74 126/75  Pulse: 81 76  Resp: 18 19  Temp: 98.6 F (37 C) 98.6 F (37 C)  SpO2: 100% 100%   Physical Exam Lungs:  Non labored Incisions:  L thigh with vac in place, good seal Extremities:  L thigh and calf soft; monophasic L ATA signal Neurologic: A&O  CBC    Component Value Date/Time   WBC 11.8 (H) 10/29/2019 0226   RBC 2.88 (L) 10/29/2019 0226   HGB 7.9 (L) 10/29/2019 0226   HCT 24.9 (L) 10/29/2019 0226   PLT 359 10/29/2019 0226   MCV 86.5 10/29/2019 0226   MCH 27.4 10/29/2019 0226   MCHC 31.7 10/29/2019 0226   RDW 17.2 (H) 10/29/2019 0226   LYMPHSABS 1.9 10/25/2019 0725   MONOABS 0.5 10/25/2019 0725   EOSABS 0.1 10/25/2019 0725   BASOSABS 0.0 10/25/2019 0725    BMET    Component Value Date/Time   NA 136 10/29/2019 0226   K 4.0 10/29/2019 0226   CL 105 10/29/2019 0226   CO2 19 (L) 10/29/2019 0226   GLUCOSE 170 (H) 10/29/2019 0226   BUN 15 10/29/2019 0226   CREATININE 1.25 (H) 10/29/2019 0226   CALCIUM 8.5 (L) 10/29/2019 0226   GFRNONAA 59 (L) 10/29/2019 0226   GFRAA >60 10/29/2019 0226    INR    Component Value Date/Time   INR 1.9 (H) 10/23/2019 0618     Intake/Output Summary (Last 24 hours) at 11/01/2019 0848 Last data filed at 11/01/2019 0329 Gross per 24 hour  Intake 240 ml  Output 1250 ml  Net -1010 ml     Assessment/Plan:  69 y.o. male is s/p repair of LLE bypass for bleeding 9 Days Post-Op   Patent bypass with peripheral signals by doppler Vac dressing change tomorrow with WOC RN Continue p.o. doxycycline per ID Encouraged OOB [facility] is currently full; patient is  agreeable to SNF  Dagoberto Ligas, PA-C [facility] [phone] 11/01/2019 8:48 AM  I have interviewed the patient and examined the patient. I agree with the findings by the PA.  Awaiting [facility].  May need skilled nursing facility.  Appreciate IDs help.  Gae Gallop, MD [phone]

## 2019-11-02 LAB — GLUCOSE, CAPILLARY
Glucose-Capillary: 100 mg/dL — ABNORMAL HIGH (ref 70–99)
Glucose-Capillary: 130 mg/dL — ABNORMAL HIGH (ref 70–99)
Glucose-Capillary: 111 mg/dL — ABNORMAL HIGH (ref 70–99)
Glucose-Capillary: 86 mg/dL (ref 70–99)

## 2019-11-02 NOTE — Progress Notes (Signed)
   VASCULAR SURGERY ASSESSMENT & PLAN:   POD 10 - S/P  REPAIR OF LEFT LOWER EXTREMITY BYPASS FOR BLEEDING: No further bleeding issues.  He has a wound on the medial right thigh.  He is for [facility] dressing change today.  His graft is patent.  ID: On po doxycycline per ID.  Awaiting [facility] versus skilled nursing facility.  SUBJECTIVE:   No complaints.  PHYSICAL EXAM:   Vitals:   11/01/19 1116 11/01/19 1641 11/02/19 0413 11/02/19 0742  BP: 119/74  118/72 108/60  Pulse: 69   67  Resp:   (!) 27 18  Temp: 98.2 F (36.8 C) 98.3 F (36.8 C) 97.8 F (36.6 C) 98.8 F (37.1 C)  TempSrc: Oral Oral Oral Oral  SpO2: 95%  98% 94%  Weight:      Height:       The left foot is warm.  LABS:   Lab Results  Component Value Date   WBC 11.8 (H) 10/29/2019   HGB 7.9 (L) 10/29/2019   HCT 24.9 (L) 10/29/2019   MCV 86.5 10/29/2019   PLT 359 10/29/2019   Lab Results  Component Value Date   CREATININE 1.25 (H) 10/29/2019   Lab Results  Component Value Date   INR 1.9 (H) 10/23/2019   CBG (last 3)  Recent Labs    11/01/19 1644 11/01/19 1755 11/02/19 0558  GLUCAP 54* 114* 100*    PROBLEM LIST:    Principal Problem:   Wound infection after surgery Active Problems:   Diabetes mellitus type 2 in obese (HCC)   CKD (chronic kidney disease) stage 3, GFR 30-59 ml/min   Ischemic leg   Staphylococcus aureus infection   Hemorrhage   CURRENT MEDS:   . allopurinol  100 mg Oral Daily  . Chlorhexidine Gluconate Cloth  6 each Topical Daily  . clopidogrel  75 mg Oral Daily  . doxycycline  100 mg Oral Q12H  . ferrous sulfate  325 mg Oral Q breakfast  . heparin injection (subcutaneous)  5,000 Units Subcutaneous Q8H  . insulin pump   Subcutaneous TID WC, HS, 0200  . pantoprazole  40 mg Oral QHS  . pravastatin  40 mg Oral Q lunch  . pregabalin  50 mg Oral QHS    Deitra Mayo Office: [phone] 11/02/2019

## 2019-11-02 NOTE — Progress Notes (Signed)
Physical Therapy Treatment Patient Details Name: [name] MRN: [id number] DOB: 06/22/1951 Today's Date: 11/02/2019    History of Present Illness Pt is a 69 y/o male admitted secondary to abscess in LLE; s/p drainage. Pt also with new bleeding of LLE graft and is s/p emergent repair of bypass graft. PMH includes DM, CKD, HTN, and sleep apnea on bipap.     PT Comments    Patient progressing well towards PT goals. Reports improved pain after pain medication this AM but does report lightheadedness with mobility, which he believes is from the pain medicine. Increased time to perform all transfers/ambulation with long seated rest breaks. Tolerated multiple stands from low surface with Min guard assist. Noted to have 2/4 DOE with ambulation. Highly motivated to improve activity. Will continue to follow.   Follow Up Recommendations  [facility]     Equipment Recommendations  None recommended by PT    Recommendations for Other Services       Precautions / Restrictions Precautions Precautions: Fall Precaution Comments: wound vac Restrictions Weight Bearing Restrictions: No    Mobility  Bed Mobility Overal bed mobility: Needs Assistance Bed Mobility: Supine to Sit     Supine to sit: Supervision;HOB elevated     General bed mobility comments: Increased time and supervision for lines. + lightheadedness once sitting up.  Transfers Overall transfer level: Needs assistance Equipment used: Rolling walker (2 wheeled) Transfers: Sit to/from Stand Sit to Stand: Min guard         General transfer comment: Stood from EOB x5 prior to mobility; returning to sit due to lightheadedness, BP stable; stood from chair x3.  Ambulation/Gait Ambulation/Gait assistance: Min guard Gait Distance (Feet): 12 Feet(+20' +20' +40') Assistive device: Rolling walker (2 wheeled) Gait Pattern/deviations: Decreased weight shift to left;Antalgic;Step-through pattern;Decreased stance time - left;Decreased step  length - right Gait velocity: Decreased   General Gait Details: Slow, antalgic gait. Pt unable to fully extend L knee during stance phase. Pt requiring 2 longer seated rests during ambulation; 2/4 DOE.   Stairs             Wheelchair Mobility    Modified Rankin (Stroke Patients Only)       Balance Overall balance assessment: Needs assistance Sitting-balance support: No upper extremity supported;Feet supported Sitting balance-Leahy Scale: Good     Standing balance support: During functional activity Standing balance-Leahy Scale: Poor Standing balance comment: Reliant on BUE support                             Cognition Arousal/Alertness: Awake/alert Behavior During Therapy: WFL for tasks assessed/performed Overall Cognitive Status: Within Functional Limits for tasks assessed                                        Exercises      General Comments        Pertinent Vitals/Pain Pain Assessment: Faces Faces Pain Scale: Hurts a little bit Pain Location: L knee tightness Pain Descriptors / Indicators: Tightness Pain Intervention(s): Monitored during session    Home Living                      Prior Function            PT Goals (current goals can now be found in the care plan section) Progress towards PT goals:  Progressing toward goals    Frequency    Min 3X/week      PT Plan Current plan remains appropriate    Co-evaluation              AM-PAC PT "6 Clicks" Mobility   Outcome Measure  Help needed turning from your back to your side while in a flat bed without using bedrails?: None Help needed moving from lying on your back to sitting on the side of a flat bed without using bedrails?: None Help needed moving to and from a bed to a chair (including a wheelchair)?: A Little Help needed standing up from a chair using your arms (e.g., wheelchair or bedside chair)?: A Little Help needed to walk in hospital  room?: A Little Help needed climbing 3-5 steps with a railing? : A Lot 6 Click Score: 19    End of Session Equipment Utilized During Treatment: Gait belt Activity Tolerance: Patient tolerated treatment well Patient left: in chair;with call bell/phone within reach Nurse Communication: Mobility status PT Visit Diagnosis: Unsteadiness on feet (R26.81);Muscle weakness (generalized) (M62.81);Pain Pain - Right/Left: Left Pain - part of body: Leg     Time: 6553-7482 PT Time Calculation (min) (ACUTE ONLY): 44 min  Charges:  $Gait Training: 23-37 mins $Therapeutic Activity: 8-22 mins                     Marisa Severin, PT, DPT Acute Rehabilitation Services Pager [phone] Office [place] 11/02/2019, 12:27 PM

## 2019-11-02 NOTE — TOC Progression Note (Signed)
Transition of Care (TOC) - Progression Note  [name] RN, BSN Transitions of Care Unit 4E- RN Case Manager [phone]   Patient Details  Name: [name] MRN: [id number] Date of Birth: 1951/04/22  Transition of Care [facility]) CM/SW Contact  Dahlia Client, Romeo Rabon, RN Phone Number: 11/02/2019, 4:31 PM  Clinical Narrative:    [facility] team continues to work on placement for STSNF, at this time [facility] does not have any beds available today or tomorrow and doubtful for Wednesday per [facility] with CIR. Pt is medically stable to transition and is agreeable to STSNF- calls have been made to check status with: [facility]- msg left- no return call yet [facility]- has declined [facility]- reviewing - still pending  Spoke with pt at the bedside to update on bed status- pt is still wanting either CIR or STSNF for rehab- explained bed situation for [facility] and others that was per pt preference. Pt request call be made to his wife- TC made to Mrs. [name]- who is agreeable to expand bed search at this time to [facility]- info faxed- pending review [facility]- info faxed- pending review [facility] faxed- pending review [facility]- info faxed- pending review  She will also look at other options and let TOC know if there are other facilities that she would be agreeable to tomorrow if none of these have offered placement or have no beds available. The other option discussed would be to consider home with [facility].  TOC will f/u on bed search in AM     Expected Discharge Plan: [place] Barriers to Discharge: Continued Medical Work up  Expected Discharge Plan and Services Expected Discharge Plan: [place]   Discharge Planning Services: CM Consult Post Acute Care Choice: [place] arrangements for the past 2 months: Single Family Home                 DME Arranged: Vac DME Agency: [facility] Date DME Agency Contacted: 10/27/19 Time DME Agency  Contacted: 1031 Representative spoke with at DME Agency: [place]: RN, IV Antibiotics [place]: [place] Date [place]: 10/22/19 Time [place]: 1330 Representative spoke with at [place]: [place] ([place]) Interventions    Readmission Risk Interventions Readmission Risk Prevention Plan 05/09/2018 05/09/2018  Transportation Screening - Complete  PCP or Specialist Appt within 5-7 Days Complete -  Home Care Screening Complete -  Medication Review (RN CM) Complete -  Some recent data might be hidden

## 2019-11-02 NOTE — Progress Notes (Signed)
Mobility Specialist - Progress Note   11/02/19 1334  Pain Assessment  Pain Assessment 0-10  Pain Score 9  Pain Location R flank  Pain Descriptors / Indicators Cramping  Pain Intervention(s) Relaxation  Mobility  Activity Ambulated in hall  Level of Assistance Standby assist, set-up cues, supervision of patient - no hands on  Assistive Device Front wheel walker  Distance Ambulated (ft) 100 ft (129ft x1, 50 ftx3)  Mobility Response Tolerated fair  Mobility performed by Mobility specialist  Bed Position Chair  $Mobility charge 1 Mobility   Pt stopped to rest 4x due to his L leg tightness which he says has remained the same. He stopped once due to right flank pain which resolved with rest, he rated it a 9/10 and stated that this has happened in the past. Pt notes he was able to bear weight on his L heel during this session. Ambulation was done with chair follow, he stopped to rest in increments as noted above. Pt was left in his chair after ambulation.  Pre-mobility: 80 HR During mobility: 117 HR Post-mobility:97 HR   [name] Solicitor

## 2019-11-02 NOTE — NC FL2 (Signed)
[place] LEVEL OF CARE SCREENING TOOL     IDENTIFICATION  Patient Name: [name] Birthdate: [birth year] Sex: male Admission Date (Current Location): 10/21/2019  [facility] and [place] Number:  Herbalist and Address:  [facility] [place], [place] [address] [place], [place] [address]      Provider Number: [id number]  Attending Physician Name and Address:  [place], Whitney Name and Phone Number:       Current Level of Care: Hospital Recommended Level of Care: [place] Prior Approval Number:    Date Approved/Denied: 05/05/18 PASRR Number: [id number]  Discharge Plan: SNF    Current Diagnoses: Patient Active Problem List   Diagnosis Date Noted  . Staphylococcus aureus infection   . Hemorrhage   . Ischemic leg 10/21/2019  . Wound infection after surgery 10/21/2019  . Type 2 diabetes mellitus with hyperglycemia, with long-term current use of insulin ([place])   . Thrombocytosis ([place])   . Chronic pain of left knee   . Primary osteoarthritis of right knee   . Effusion of lower leg joint   . Moderate episode of recurrent major depressive disorder ([place])   . Hypoalbuminemia due to protein-calorie malnutrition ([place])   . Debility 12/26/2018  . Acute on chronic renal failure ([place]) 12/19/2018  . Severe sepsis ([place]) 12/19/2018  . Hyponatremia 12/19/2018  . History of DVT (deep vein thrombosis) 12/19/2018  . Non-healing surgical wound 09/11/2018  . CKD (chronic kidney disease) stage 3, GFR 30-59 ml/min 06/28/2018  . PVD (peripheral vascular disease) ([place]) 06/28/2018  . Diabetic foot ulcer ([place]) 06/27/2018  . Anemia 06/27/2018  . Benign essential HTN   . Diabetes mellitus type 2 in obese ([place])   . Acute blood loss anemia   . Tachypnea   . Leukocytosis   . Critical limb ischemia with history of revascularization of same extremity 04/28/2018  . Critical lower limb ischemia 04/28/2018  . Claudication in  peripheral vascular disease ([place]) 08/04/2017  . IDDM (insulin dependent diabetes mellitus) 12/02/2012  . HTN (hypertension) 12/02/2012    Orientation RESPIRATION BLADDER Height & Weight     Self, Time, Situation, Place  Normal Continent Weight: 238 lb (108 kg) Height:  6\' 2"  (188 cm)  BEHAVIORAL SYMPTOMS/MOOD NEUROLOGICAL BOWEL NUTRITION STATUS      Continent Diet(please see discharge summary)  AMBULATORY STATUS COMMUNICATION OF NEEDS Skin   Supervision Verbally Surgical wounds(closed incision left upper thigh,closed incision left leg, negative pressure wound therapy, left upper thigh)                       Personal Care Assistance Level of Assistance  Bathing, Feeding, Dressing Bathing Assistance: Limited assistance Feeding assistance: Independent Dressing Assistance: Limited assistance     Functional Limitations Info  Sight, Hearing, Speech Sight Info: Adequate(wears glasses) Hearing Info: Adequate Speech Info: Adequate    SPECIAL CARE FACTORS FREQUENCY  PT (By licensed PT), OT (By licensed OT)     PT Frequency: 3x per week OT Frequency: 3x per week            Contractures Contractures Info: Not present    Additional Factors Info  Code Status, Allergies Code Status Info: FULL Allergies Info: NKA     Isolation Precautions Info: MRSA     Current Medications (11/02/2019):  This is the current hospital active medication list Current Facility-Administered Medications  Medication Dose Route Frequency Provider Last Rate Last Admin  . acetaminophen (TYLENOL)  tablet 325-650 mg  325-650 mg Oral Q4H PRN Dagoberto Ligas, PA-C   650 mg at 10/30/19 1944   Or  . acetaminophen (TYLENOL) suppository 325-650 mg  325-650 mg Rectal Q4H PRN Dagoberto Ligas, PA-C      . allopurinol (ZYLOPRIM) tablet 100 mg  100 mg Oral Daily Dagoberto Ligas, PA-C   100 mg at 11/02/19 0818  . alum & mag hydroxide-simeth (MAALOX/MYLANTA) 200-200-20 MG/5ML suspension 15-30 mL  15-30 mL Oral  Q2H PRN Dagoberto Ligas, PA-C      . bisacodyl (DULCOLAX) suppository 10 mg  10 mg Rectal Daily PRN Dagoberto Ligas, PA-C      . Chlorhexidine Gluconate Cloth 2 % PADS 6 each  6 each Topical Daily Waynetta Sandy, MD   6 each at 11/02/19 [phone]  . clopidogrel (PLAVIX) tablet 75 mg  75 mg Oral Daily Dagoberto Ligas, PA-C   75 mg at 11/02/19 0818  . dextrose 50 % solution 50 mL  50 mL Intravenous Q15 min PRN Dagoberto Ligas, PA-C   25 mL at 11/01/19 1700  . doxycycline (VIBRA-TABS) tablet 100 mg  100 mg Oral Q12H Tommy Medal, Lavell Islam, MD   100 mg at 11/02/19 1220  . ferrous sulfate tablet 325 mg  325 mg Oral Q breakfast Dagoberto Ligas, PA-C   325 mg at 11/02/19 0818  . guaiFENesin-dextromethorphan (ROBITUSSIN DM) 100-10 MG/5ML syrup 15 mL  15 mL Oral Q4H PRN Dagoberto Ligas, PA-C      . heparin injection 5,000 Units  5,000 Units Subcutaneous Q8H Barbie Banner, PA-C   5,000 Units at 11/02/19 1348  . hydrALAZINE (APRESOLINE) injection 5 mg  5 mg Intravenous Q20 Min PRN Dagoberto Ligas, PA-C      . HYDROcodone-acetaminophen (NORCO/VICODIN) 5-325 MG per tablet 1-2 tablet  1-2 tablet Oral Q6H PRN Waynetta Sandy, MD   1 tablet at 11/02/19 0818  . insulin pump   Subcutaneous TID WC, HS, 0200 Dagoberto Ligas, PA-C   Given at 11/02/19 1252  . labetalol (NORMODYNE) injection 10 mg  10 mg Intravenous Q10 min PRN Dagoberto Ligas, PA-C      . metoprolol tartrate (LOPRESSOR) injection 2-5 mg  2-5 mg Intravenous Q2H PRN Dagoberto Ligas, PA-C      . morphine 2 MG/ML injection 2 mg  2 mg Intravenous Q2H PRN Dagoberto Ligas, PA-C   2 mg at 10/27/19 1006  . ondansetron (ZOFRAN) injection 4 mg  4 mg Intravenous Q6H PRN Dagoberto Ligas, PA-C      . pantoprazole (PROTONIX) EC tablet 40 mg  40 mg Oral QHS Dagoberto Ligas, PA-C   40 mg at 11/01/19 2150  . phenol (CHLORASEPTIC) mouth spray 1 spray  1 spray Mouth/Throat PRN Dagoberto Ligas, PA-C      . polyethylene glycol (MIRALAX /  GLYCOLAX) packet 17 g  17 g Oral Daily PRN Dagoberto Ligas, PA-C      . pravastatin (PRAVACHOL) tablet 40 mg  40 mg Oral Q lunch Dagoberto Ligas, PA-C   40 mg at 11/02/19 1220  . pregabalin (LYRICA) capsule 50 mg  50 mg Oral QHS Dagoberto Ligas, PA-C   50 mg at 11/01/19 2150     Discharge Medications: Please see discharge summary for a list of discharge medications.  Relevant Imaging Results:  Relevant Lab Results:   Additional Information SSN [id number]          will need wound vac  Vinie Sill, LCSWA

## 2019-11-02 NOTE — Consult Note (Signed)
[place] Nurse wound follow up Patient receiving care in [place]. Wound type: Left thigh surgical wound Measurement: 15.8 cm x 4 cm x 1.9 cm Wound bed: 100% pink, clean Drainage (amount, consistency, odor) serous in cannister; no odor Periwound: intact Dressing procedure/placement/frequency: one piece of black foam removed after LIBERALLY soaking with saline solution; one piece placed. Immediate seal obtained. Patient tolerated well. I let his primary RN, Hilaria Ota, know he was requesting pain medication.  Spouse at bedside at end of new dressing placement. Val Riles, RN, MSN, CWOCN, CNS-BC, pager [phone]

## 2019-11-03 LAB — GLUCOSE, CAPILLARY
Glucose-Capillary: 126 mg/dL — ABNORMAL HIGH (ref 70–99)
Glucose-Capillary: 140 mg/dL — ABNORMAL HIGH (ref 70–99)
Glucose-Capillary: 79 mg/dL (ref 70–99)
Glucose-Capillary: 148 mg/dL — ABNORMAL HIGH (ref 70–99)
Glucose-Capillary: 76 mg/dL (ref 70–99)

## 2019-11-03 NOTE — TOC Progression Note (Signed)
Transition of Care [facility]) - Progression Note    Patient Details  Name: [name] MRN: [id number] Date of Birth: [birth year]  Transition of Care [facility]) CM/SW [place], [place] Phone Number: 11/03/2019, 11:41 AM  Clinical Narrative:     Patient has no bed offers- [facility]- states patient is in copay status and will need to pay 1250.00 before admitted to there.  [name], MSW, [place] Clinical Social Worker    Expected Discharge Plan: [place] Barriers to Discharge: Continued Medical Work up  Expected Discharge Plan and Services Expected Discharge Plan: [place]   Discharge Planning Services: CM Consult Post Acute Care Choice: [place] arrangements for the past 2 months: Single Family Home                 DME Arranged: Vac DME Agency: [employer] Date DME Agency Contacted: 10/27/19 Time DME Agency Contacted: [phone] Representative spoke with at DME Agency: Olivia Mackie HH Arranged: RN, IV Antibiotics [place]: [place] Date [place]: 10/22/19 Time [place]: 1330 Representative spoke with at [place]: [place] ([place]) Interventions    Readmission Risk Interventions Readmission Risk Prevention Plan 05/09/2018 05/09/2018  Transportation Screening - Complete  PCP or Specialist Appt within 5-7 Days Complete -  Home Care Screening Complete -  Medication Review (RN CM) Complete -  Some recent data might be hidden

## 2019-11-03 NOTE — TOC Progression Note (Signed)
Transition of Care (TOC) - Progression Note  [name] RN, BSN Transitions of Care Unit 4E- RN Case Manager [phone]   Patient Details  Name: [name] MRN: [id number] Date of Birth: 11-30-1950  Transition of Care [facility]) CM/SW Contact  Dahlia Client, [place] Rabon, RN Phone Number: 11/03/2019, 2:44 PM  Clinical Narrative:    Spoke to pt at bedside along with CSW- Gwendel Hanson. To discuss lack of bed offers and [facility] also returned call- and they will have beds available some the rest of the week- are willing to look at pt to see if he is appropriate for their program. - discussed this also with pt- who would like to have [facility] assess him for possible admission pending bed offer and insurance approval. - call made back to Pam at [facility] and faxed info to them at [phone]- await review of info and return call at this time.    Expected Discharge Plan: [place] Barriers to Discharge: Continued Medical Work up  Expected Discharge Plan and Services Expected Discharge Plan: [place]   Discharge Planning Services: CM Consult Post Acute Care Choice: [place] arrangements for the past 2 months: Single Family Home                 DME Arranged: Vac DME Agency: [employer] Date DME Agency Contacted: 10/27/19 Time DME Agency Contacted: [phone] Representative spoke with at DME Agency: [place]: RN, IV Antibiotics [place]: [place] Date [place]: 10/22/19 Time [place]: 1330 Representative spoke with at [place]: [place] ([place]) Interventions    Readmission Risk Interventions Readmission Risk Prevention Plan 05/09/2018 05/09/2018  Transportation Screening - Complete  PCP or Specialist Appt within 5-7 Days Complete -  Home Care Screening Complete -  Medication Review (RN CM) Complete -  Some recent data might be hidden

## 2019-11-03 NOTE — Progress Notes (Addendum)
   VASCULAR SURGERY ASSESSMENT & PLAN:   POD 11 - S/P  REPAIR OF LEFT LOWER EXTREMITY BYPASS FOR BLEEDING: No further bleeding issues.  He has a wound on the medial left thigh.  VAC dressing change yesterday. Per CWOCN, wound bed pink, clean.  His graft is patent.  Left foot ulcer pink with small amount of fibrinous exudate  ID: On po doxycycline per ID.  Awaiting CIR versus skilled nursing facility.  SUBJECTIVE:   No complaints  PHYSICAL EXAM:   Vitals:   11/02/19 2112 11/02/19 2325 11/03/19 0357 11/03/19 0359  BP: 106/68 120/64 (!) 101/46   Pulse: 82 77 75   Resp: 17 (!) 21 (!) 26 (!) 21  Temp: 99.2 F (37.3 C) 99.1 F (37.3 C) 98.5 F (36.9 C)   TempSrc: Oral Oral Oral   SpO2: 100% 99% 94%   Weight:      Height:       General appearance: Alert and oriented x4 in no apparent distress Heart rate and rhythm regular Left thigh wound VAC in place with good seal Bilateral lower legs with areas of epidermilysis and no signs of infection. Brisk left dorsalis pedis Doppler signal.  1 cm left lateral foot ulcer pink.  I am unable to express any drainage.  Small area of fibrinous exudate.     LABS:   No new labs CBG (last 3)  Recent Labs    11/02/19 2110 11/03/19 0356 11/03/19 0605  GLUCAP 86 126* 140*    PROBLEM LIST:    Principal Problem:   Wound infection after surgery Active Problems:   Diabetes mellitus type 2 in obese (HCC)   CKD (chronic kidney disease) stage 3, GFR 30-59 ml/min   Ischemic leg   Staphylococcus aureus infection   Hemorrhage   CURRENT MEDS:   . allopurinol  100 mg Oral Daily  . Chlorhexidine Gluconate Cloth  6 each Topical Daily  . clopidogrel  75 mg Oral Daily  . doxycycline  100 mg Oral Q12H  . ferrous sulfate  325 mg Oral Q breakfast  . heparin injection (subcutaneous)  5,000 Units Subcutaneous Q8H  . insulin pump   Subcutaneous TID WC, HS, 0200  . pantoprazole  40 mg Oral QHS  . pravastatin  40 mg Oral Q lunch  . pregabalin   50 mg Oral QHS   Risa Grill, [place] Office: [phone] 11/03/2019   I have independently interviewed and examined patient and agree with PA assessment and plan above.  Left foot wound appears stable and MRI was negative.  Continue wound VAC to left thigh.  He is pending SNF.  Andriel Omalley C. Donzetta Matters, MD [facility] [place] Office: [phone] Pager: [phone]

## 2019-11-03 NOTE — Progress Notes (Signed)
Mobility Specialist: Progress Note    11/03/19 1136  Therapy Vitals  Pulse Rate 71  Oxygen Therapy  SpO2 100 %  O2 Device Room Air  Patient Activity (if Appropriate) Ambulating  Mobility  Activity Ambulated in hall  Level of Assistance Standby assist, set-up cues, supervision of patient - no hands on  Assistive Device Front wheel walker  Distance Ambulated (ft) 72 ft (1x 42ft, 1x 71ft, 2x 47ft)  Mobility Response Tolerated well  Mobility performed by Mobility specialist  Bed Position Chair  $Mobility charge 1 Mobility   Pre-Mobility: 71 HR, 100 SpO2 Post-Mobility: 87 HR, 99 SpO2  Pt tolerated ambulation well today with no complaints of pain. Pt states the tightness in his L leg has improved. Pt ambulated with chair follow with seated rest breaks in intervals in distance ambulated note above. Pt was positioned in his chair with the call bell at his side.   [facility] Monice Lundy Mobility Specialist

## 2019-11-03 NOTE — Plan of Care (Signed)
  Problem: Clinical Measurements: Goal: Respiratory complications will improve Outcome: Progressing Goal: Cardiovascular complication will be avoided Outcome: Progressing   

## 2019-11-04 LAB — GLUCOSE, CAPILLARY
Glucose-Capillary: 103 mg/dL — ABNORMAL HIGH (ref 70–99)
Glucose-Capillary: 117 mg/dL — ABNORMAL HIGH (ref 70–99)
Glucose-Capillary: 120 mg/dL — ABNORMAL HIGH (ref 70–99)
Glucose-Capillary: 143 mg/dL — ABNORMAL HIGH (ref 70–99)

## 2019-11-04 NOTE — Progress Notes (Signed)
Mobility Specialist: Progress Note   11/04/19 1133  Therapy Vitals  Pulse Rate 77  BP 107/75  Oxygen Therapy  SpO2 98 %  Mobility  Activity Ambulated in hall  Level of Assistance Contact guard assist, steadying assist  Assistive Device Front wheel walker  Distance Ambulated (ft) 114 ft (1x 78ft, 1x 51ft, 1x 45ft, 1x 21ft)  Mobility Response Tolerated fair  Mobility performed by Mobility specialist  $Mobility charge 1 Mobility    Pre-Mobility: 77 HR, 107/75 BP, 98% SpO2 Post-Mobility: 87 HR, 100% SpO2  Pt tolerated ambulation fair. Pt complained of pain in his R foot and back of his L knee. As ambulation continued in the hallway pt states the pain decreased the more he walked. Pt was positioned back in bed with call bell on table beside him.   [facility] Jubal Rademaker Mobility Specialist

## 2019-11-04 NOTE — Progress Notes (Addendum)
  Progress Note    11/04/2019 8:43 AM 12 Days Post-Op  Subjective:  Says he's woozy from the morphine he got for wound vac change  Afebrile HR 70's-80's NSR 110'R-159'Y systolic 58% RA  Vitals:   11/04/19 0511 11/04/19 0720  BP: 106/60 107/66  Pulse: 74 72  Resp: 14 14  Temp: 98.3 F (36.8 C) 98.2 F (36.8 C)  SpO2: 98% 96%    Physical Exam: Cardiac:  regular Lungs:  Non labored Incisions:  Wound vac in place with good seal Extremities:  Brisk left AT doppler signal.  Wound on left foot appears unchanged from picture that was taken yesterday.    CBC    Component Value Date/Time   WBC 11.8 (H) 10/29/2019 0226   RBC 2.88 (L) 10/29/2019 0226   HGB 7.9 (L) 10/29/2019 0226   HCT 24.9 (L) 10/29/2019 0226   PLT 359 10/29/2019 0226   MCV 86.5 10/29/2019 0226   MCH 27.4 10/29/2019 0226   MCHC 31.7 10/29/2019 0226   RDW 17.2 (H) 10/29/2019 0226   LYMPHSABS 1.9 10/25/2019 0725   MONOABS 0.5 10/25/2019 0725   EOSABS 0.1 10/25/2019 0725   BASOSABS 0.0 10/25/2019 0725    BMET    Component Value Date/Time   NA 136 10/29/2019 0226   K 4.0 10/29/2019 0226   CL 105 10/29/2019 0226   CO2 19 (L) 10/29/2019 0226   GLUCOSE 170 (H) 10/29/2019 0226   BUN 15 10/29/2019 0226   CREATININE 1.25 (H) 10/29/2019 0226   CALCIUM 8.5 (L) 10/29/2019 0226   GFRNONAA 59 (L) 10/29/2019 0226   GFRAA >60 10/29/2019 0226    INR    Component Value Date/Time   INR 1.9 (H) 10/23/2019 0618     Intake/Output Summary (Last 24 hours) at 11/04/2019 0843 Last data filed at 11/04/2019 5929 Gross per 24 hour  Intake 240 ml  Output 900 ml  Net -660 ml     Assessment:  69 y.o. male is s/p:  REPAIR OF LEFT LOWER EXTREMITY BYPASS FOR BLEEDING  12 Days Post-Op  Plan: -pt with brisk left AT doppler signal -wound vac changed by WOC this am and it is with good seal -foot wound unchanged from picture yesterday. -awaiting placement-hopeful for CIR -continue po Doxycycline   Leontine Locket, PA-C [facility] [phone] 11/04/2019 8:43 AM  I have independently interviewed and examined patient and agree with PA assessment and plan above. Pending snf  Burgandy Hackworth C. Donzetta Matters, MD [facility] [place] Office: [phone] Pager: [phone]

## 2019-11-04 NOTE — TOC Progression Note (Signed)
Transition of Care [facility]) - Progression Note    Patient Details  Name: [name] MRN: [id number] Date of Birth: Nov 03, 1950  Transition of Care [facility]) CM/SW [place], [place] Phone Number: 11/04/2019, 11:50 AM  Clinical Narrative:     Patient will need insurance authorization and negative covid test results before discharging to SNF.  11:41am- CSW informed SNF, patient want to admit there and is willing to pay the deductible. CSW requested to start insurance authorization.   11:25am- Patient states he want to pursue SNF placement at [facility]- they are going to pay the deductible- no longer wants [facility].  10:54am- [place] spoke with patient and advised since Medicare Part A was not his primary, they would not cover the cost.  10:00am-CSW visit with the patient at bedside. CSW confirmed [place] requested his 20% deductible (1250.00) up front. Patient states he contacted [place] to inquire of his Medicare Part A would cover his deductible- he is waiting on call back - CSW informed [facility] rehab remains pending  [name], MSW, [place] Worker   Expected Discharge Plan: [place] Barriers to Discharge: Continued Medical Work up  Expected Discharge Plan and Services Expected Discharge Plan: [place]   Discharge Planning Services: CM Consult Post Acute Care Choice: [place] arrangements for the past 2 months: Single Family Home                 DME Arranged: Vac DME Agency: [employer] Date DME Agency Contacted: 10/27/19 Time DME Agency Contacted: [phone] Representative spoke with at DME Agency: Olivia Mackie HH Arranged: RN, IV Antibiotics [place] Agency: [place] Date [place]: 10/22/19 Time [place]: 1330 Representative spoke with at [place]: [place] ([place]) Interventions    Readmission Risk  Interventions Readmission Risk Prevention Plan 05/09/2018 05/09/2018  Transportation Screening - Complete  PCP or Specialist Appt within 5-7 Days Complete -  Home Care Screening Complete -  Medication Review (RN CM) Complete -  Some recent data might be hidden

## 2019-11-04 NOTE — Consult Note (Signed)
[place] Nurse wound follow up Patient receiving care in [place].  I informed his primary RN of the [facility] dressing when I arrived to the unit to perform. Wound type: surgical left thigh Measurement: na Wound bed: 100% pink granulation tissue Drainage (amount, consistency, odor) serous in cannister Periwound: intact Dressing procedure/placement/frequency: one piece of black foam removed, one piece placed. Immediate seal obtained.  Patient tolerated the procedure very well with saturation of existing VAC foam with saline. Val Riles, RN, MSN, CWOCN, CNS-BC, pager [phone]

## 2019-11-04 NOTE — Progress Notes (Signed)
Physical Therapy Treatment Patient Details Name: [name] MRN: [id number] DOB: Jul 19, 1950 Today's Date: 11/04/2019    History of Present Illness Pt is a 69 y/o male admitted secondary to abscess in LLE; s/p drainage. Pt also with new bleeding of LLE graft and is s/p emergent repair of bypass graft. PMH includes DM, CKD, HTN, and sleep apnea on bipap.     PT Comments    Pt in bed upon arrival of PT, agreeable to PT session with focus on progression of ambulation tolerance. The pt was able to demo significant improvements in total ambulation endurance, but continues to require frequent seated rest breaks that last 2-5 min to recover due to fatigue after 20-40 ft of ambulation. The pt was able to complete 7 short bouts of ambulation this session with VSS, but continues to need the assist of 2 people for safety, line management, and chair follow to complete longer distances. The pt continues to be appropriate for CIR-level therapies, and will continue to benefit from skilled PT to further progress functional strength, ROM, and endurance prior to d/c.    Follow Up Recommendations  CIR;Supervision/Assistance - 24 hour     Equipment Recommendations  None recommended by PT    Recommendations for Other Services       Precautions / Restrictions Precautions Precautions: Fall Precaution Comments: wound vac Restrictions Weight Bearing Restrictions: Yes RLE Weight Bearing: Weight bearing as tolerated LLE Weight Bearing: Weight bearing as tolerated    Mobility  Bed Mobility Overal bed mobility: Needs Assistance Bed Mobility: Supine to Sit     Supine to sit: Supervision     General bed mobility comments: pt able to come to sitting without assist, some VC to complete transition so that hips are square. no LOB  Transfers Overall transfer level: Needs assistance Equipment used: Rolling walker (2 wheeled) Transfers: Sit to/from Stand Sit to Stand: Min guard;Min assist          General transfer comment: sometimes needing minA to rise from chair, minA for poor eccentric lower. completed x8 through session. minG when not fatigued from chair  Ambulation/Gait Ambulation/Gait assistance: Min guard;+2 safety/equipment Gait Distance (Feet): 30 Feet(+ 79ft + 60ft + 31ft + 56ft + 3ft + 50 ft = 230 ft) Assistive device: Rolling walker (2 wheeled) Gait Pattern/deviations: Decreased weight shift to left;Antalgic;Step-through pattern;Decreased stance time - left;Decreased step length - right Gait velocity: Decreased Gait velocity interpretation: 1.31 - 2.62 ft/sec, indicative of limited community ambulator General Gait Details: Slow, antalgic gait. Pt unable to fully extend L knee or DF L ankle during stance phase, resulting in toe-onely WB which improved slightly with continued ambulation. Pt requiring 6 longer seated rests during ambulation (between each bout); 2/4 DOE. HR 90-105bpm, SpO2 in 90s   Stairs             Wheelchair Mobility    Modified Rankin (Stroke Patients Only)       Balance Overall balance assessment: Needs assistance Sitting-balance support: No upper extremity supported;Feet supported Sitting balance-Leahy Scale: Good     Standing balance support: During functional activity Standing balance-Leahy Scale: Poor Standing balance comment: Reliant on BUE support                             Cognition Arousal/Alertness: Awake/alert Behavior During Therapy: WFL for tasks assessed/performed Overall Cognitive Status: Within Functional Limits for tasks assessed  General Comments: Pt frustrated with lack of progress despite working with therapy, responds well to encouragement.      Exercises      General Comments        Pertinent Vitals/Pain Pain Assessment: Faces Faces Pain Scale: Hurts little more Pain Location: R flank, L LE (knee, ankle, wound site) Pain Descriptors /  Indicators: Grimacing;Sore;Tightness Pain Intervention(s): Monitored during session;Repositioned    Home Living                      Prior Function            PT Goals (current goals can now be found in the care plan section) Acute Rehab PT Goals Patient Stated Goal: to be able to walk better before going home PT Goal Formulation: With patient Time For Goal Achievement: 11/12/19 Potential to Achieve Goals: Good Progress towards PT goals: Progressing toward goals    Frequency    Min 3X/week      PT Plan Current plan remains appropriate    Co-evaluation              AM-PAC PT "6 Clicks" Mobility   Outcome Measure  Help needed turning from your back to your side while in a flat bed without using bedrails?: None Help needed moving from lying on your back to sitting on the side of a flat bed without using bedrails?: None Help needed moving to and from a bed to a chair (including a wheelchair)?: A Little Help needed standing up from a chair using your arms (e.g., wheelchair or bedside chair)?: A Little Help needed to walk in hospital room?: A Little Help needed climbing 3-5 steps with a railing? : A Lot 6 Click Score: 19    End of Session Equipment Utilized During Treatment: Gait belt Activity Tolerance: Patient tolerated treatment well Patient left: in chair;with call bell/phone within reach Nurse Communication: Mobility status PT Visit Diagnosis: Unsteadiness on feet (R26.81);Muscle weakness (generalized) (M62.81);Pain Pain - Right/Left: Left Pain - part of body: Leg     Time: 2683-4196 PT Time Calculation (min) (ACUTE ONLY): 43 min  Charges:  $Gait Training: 38-52 mins                     Karma Ganja, PT, DPT   Acute Rehabilitation Department Pager #: [phone]   Otho Bellows 11/04/2019, 4:21 PM

## 2019-11-05 LAB — GLUCOSE, CAPILLARY
Glucose-Capillary: 125 mg/dL — ABNORMAL HIGH (ref 70–99)
Glucose-Capillary: 69 mg/dL — ABNORMAL LOW (ref 70–99)
Glucose-Capillary: 122 mg/dL — ABNORMAL HIGH (ref 70–99)
Glucose-Capillary: 85 mg/dL (ref 70–99)
Glucose-Capillary: 67 mg/dL — ABNORMAL LOW (ref 70–99)
Glucose-Capillary: 105 mg/dL — ABNORMAL HIGH (ref 70–99)

## 2019-11-05 MED ORDER — TRIAMTERENE-HCTZ 37.5-25 MG PO TABS
1.0000 | ORAL_TABLET | Freq: Every day | ORAL | Status: DC
  Administered 2019-11-05 – 2019-11-07 (×3): 1 via ORAL
  Filled 2019-11-05 (×3): qty 1
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
## 2019-11-05 NOTE — Progress Notes (Addendum)
  Progress Note    11/05/2019 9:44 AM 13 Days Post-Op  Subjective:  Says they are having trouble with getting a vac at [facility].  Says his first choice would be [facility] here at [facility].   Afebrile VSS 98% RA  Vitals:   11/05/19 0318 11/05/19 0830  BP: 125/64 118/65  Pulse: 75 74  Resp: 13 19  Temp: 98.9 F (37.2 C) 98.4 F (36.9 C)  SpO2: 97% 98%    Physical Exam: Cardiac:  regular Lungs:  Non labored Incisions:  Left thigh with wound vac with good seal Extremities:  Brisk doppler signal left AT>PT     CBC    Component Value Date/Time   WBC 11.8 (H) 10/29/2019 0226   RBC 2.88 (L) 10/29/2019 0226   HGB 7.9 (L) 10/29/2019 0226   HCT 24.9 (L) 10/29/2019 0226   PLT 359 10/29/2019 0226   MCV 86.5 10/29/2019 0226   MCH 27.4 10/29/2019 0226   MCHC 31.7 10/29/2019 0226   RDW 17.2 (H) 10/29/2019 0226   LYMPHSABS 1.9 10/25/2019 0725   MONOABS 0.5 10/25/2019 0725   EOSABS 0.1 10/25/2019 0725   BASOSABS 0.0 10/25/2019 0725    BMET    Component Value Date/Time   NA 136 10/29/2019 0226   K 4.0 10/29/2019 0226   CL 105 10/29/2019 0226   CO2 19 (L) 10/29/2019 0226   GLUCOSE 170 (H) 10/29/2019 0226   BUN 15 10/29/2019 0226   CREATININE 1.25 (H) 10/29/2019 0226   CALCIUM 8.5 (L) 10/29/2019 0226   GFRNONAA 59 (L) 10/29/2019 0226   GFRAA >60 10/29/2019 0226    INR    Component Value Date/Time   INR 1.9 (H) 10/23/2019 0618     Intake/Output Summary (Last 24 hours) at 11/05/2019 0944 Last data filed at 11/05/2019 0834 Gross per 24 hour  Intake 118 ml  Output 1175 ml  Net -1057 ml     Assessment:  69 y.o. male is s/p:  REPAIR OF LEFT LOWER EXTREMITY BYPASS FOR BLEEDING  13 Days Post-Op  Plan: -pt with brisk doppler signal AT>PT -wound vac left thigh with good seal -wound on pt's left foot looks to be improving. -social work continuing to work on placement.  Per [facility], [facility] does not have wound vac and won't have it for a few days.  His first choice is  [facility]IR.  I talked with Claiborne Billings with admissions in [facility].  They do not have beds available and probably won't until the beginning of the week.  I have put in for a CIR consult. -continue PT/OT -continue vac changes per woc. -DVT prophylaxis:  Sq heparin   Leontine Locket, PA-C [facility] [phone] 11/05/2019 9:44 AM  I have independently interviewed and examined patient and agree with PA assessment and plan above. [facility] with wound vac. Continue antibiotics per ID.   Darene Nappi C. Donzetta Matters, MD [facility] [place] Office: [phone] Pager: [phone]

## 2019-11-05 NOTE — TOC Progression Note (Signed)
Transition of Care [facility]) - Progression Note    Patient Details  Name: [name] MRN: [id number] Date of Birth: Jun 19, 1951  Transition of Care [facility]) CM/SW [place], [place] Phone Number: 11/05/2019, 10:02 AM  Clinical Narrative:     CSW visit with the patient at bedside. Patient inquired about CIR. CSW explained once we have decided to move forward with SNF as back-up plan to CIR, and SNF has started insurance authorization, CIR no longer an option because they can not request authorization at the same time. Patient states he understands. CSW advised we are waiting on insurance authorization, which maybe by that time the SNF will have wound vac, or has made arrangements to get the equipment needed. Patient states he understands. Discharge remains short term  rehab at [facility]. Patient expressed wound care supplies are being taken out his home supplies( located in his room) and being used while here in the hospital. CSW made RN aware of his concern- RN states she will follow up.  Patient states no further questions or concerns at this time.   CSW will continue to follow and assist with discharge planning.  [name], MSW, [place] Clinical Social Worker   Expected Discharge Plan: [place] Barriers to Discharge: Continued Medical Work up  Expected Discharge Plan and Services Expected Discharge Plan: [place]   Discharge Planning Services: CM Consult Post Acute Care Choice: [place] arrangements for the past 2 months: Single Family Home                 DME Arranged: Vac DME Agency: [facility] Date DME Agency Contacted: 10/27/19 Time DME Agency Contacted: [phone] Representative spoke with at DME Agency: Olivia Mackie HH Arranged: RN, IV Antibiotics [place]: [place] Date [place]: 10/22/19 Time [place]: 1330 Representative spoke with at [place]: [place] ([place])  Interventions    Readmission Risk Interventions Readmission Risk Prevention Plan 05/09/2018 05/09/2018  Transportation Screening - Complete  PCP or Specialist Appt within 5-7 Days Complete -  Home Care Screening Complete -  Medication Review (RN CM) Complete -  Some recent data might be hidden

## 2019-11-05 NOTE — Progress Notes (Signed)
Mobility Specialist: Progress Note    11/05/19 1400  Mobility  Activity Ambulated in hall  Level of Assistance Contact guard assist, steadying assist  Assistive Device Front wheel walker  Distance Ambulated (ft) 60 ft ((+ 29ft + 62ft + 109ft + 57ft = 327ft total))  Mobility Response Tolerated well  Mobility performed by Mobility specialist  Bed Position Chair  $Mobility charge 1 Mobility    Pre-Mobility: 74 HR, 99% SpO2  Pt tolerated ambulation well. Pt says his legs felt stiff towards beginning but felt better throughout ambulation. Pt required chair follow and took four seated rest breaks. Pt was positioned in chair with feet elevated and call bell within reach.  [facility] Saphyre Cillo Mobility Specialist

## 2019-11-06 LAB — CBC
HCT: 24.1 % — ABNORMAL LOW (ref 39.0–52.0)
Hemoglobin: 7.6 g/dL — ABNORMAL LOW (ref 13.0–17.0)
MCH: 27.3 pg (ref 26.0–34.0)
MCHC: 31.5 g/dL (ref 30.0–36.0)
MCV: 86.7 fL (ref 80.0–100.0)
Platelets: 412 10*3/uL — ABNORMAL HIGH (ref 150–400)
RBC: 2.78 MIL/uL — ABNORMAL LOW (ref 4.22–5.81)
RDW: 17.4 % — ABNORMAL HIGH (ref 11.5–15.5)
WBC: 8.6 10*3/uL (ref 4.0–10.5)
nRBC: 0 % (ref 0.0–0.2)

## 2019-11-06 LAB — GLUCOSE, CAPILLARY
Glucose-Capillary: 122 mg/dL — ABNORMAL HIGH (ref 70–99)
Glucose-Capillary: 101 mg/dL — ABNORMAL HIGH (ref 70–99)
Glucose-Capillary: 52 mg/dL — ABNORMAL LOW (ref 70–99)
Glucose-Capillary: 79 mg/dL (ref 70–99)
Glucose-Capillary: 73 mg/dL (ref 70–99)
Glucose-Capillary: 106 mg/dL — ABNORMAL HIGH (ref 70–99)

## 2019-11-06 NOTE — TOC Progression Note (Signed)
Transition of Care [facility]) - Progression Note    Patient Details  Name: BRANTON EINSTEIN MRN: [id number] Date of Birth: 1950/07/09  Transition of Care [facility]) CM/SW [place], [place] Phone Number: 11/06/2019, 11:47 AM  Clinical Narrative:     SNF informed CSW patient's insurance requested updated clinicals and they requested CSW to send. CSW faxed updated clinicals to [facility] [phone] refer # [id number].  [name], MSW, [place] Clinical Social Worker   Expected Discharge Plan: [place] Barriers to Discharge: Continued Medical Work up  Expected Discharge Plan and Services Expected Discharge Plan: [place]   Discharge Planning Services: CM Consult Post Acute Care Choice: [place] arrangements for the past 2 months: Single Family Home                 DME Arranged: Vac DME Agency: [facility] Date DME Agency Contacted: 10/27/19 Time DME Agency Contacted: [phone] Representative spoke with at DME Agency: Olivia Mackie HH Arranged: RN, IV Antibiotics [place]: [place] Date [place]: 10/22/19 Time [place]: 1330 Representative spoke with at [place]: [place] ([place]) Interventions    Readmission Risk Interventions Readmission Risk Prevention Plan 05/09/2018 05/09/2018  Transportation Screening - Complete  PCP or Specialist Appt within 5-7 Days Complete -  Home Care Screening Complete -  Medication Review (RN CM) Complete -  Some recent data might be hidden

## 2019-11-06 NOTE — Progress Notes (Addendum)
  Progress Note    11/06/2019 7:32 AM 14 Days Post-Op  Subjective: He has no complaints this morning   Vitals:   11/06/19 0012 11/06/19 0446  BP: (!) 112/54 114/79  Pulse: 74 71  Resp: 19 12  Temp: 98.8 F (37.1 C) 98.2 F (36.8 C)  SpO2: 98% 99%    Physical Exam: Cardiac: Heart rate and rhythm are regular Lungs: Clear to auscultation bilaterally Extremities: Left lower extremity examined.  Brisk dorsalis pedis Doppler signal.  Thigh wound VAC in place with good seal.  Areas of epidermal lysis of the lower leg are dry and without signs of infection.  Left lateral foot ulcer continues to show improvement.   CBC    Component Value Date/Time   WBC 11.8 (H) 10/29/2019 0226   RBC 2.88 (L) 10/29/2019 0226   HGB 7.9 (L) 10/29/2019 0226   HCT 24.9 (L) 10/29/2019 0226   PLT 359 10/29/2019 0226   MCV 86.5 10/29/2019 0226   MCH 27.4 10/29/2019 0226   MCHC 31.7 10/29/2019 0226   RDW 17.2 (H) 10/29/2019 0226   LYMPHSABS 1.9 10/25/2019 0725   MONOABS 0.5 10/25/2019 0725   EOSABS 0.1 10/25/2019 0725   BASOSABS 0.0 10/25/2019 0725    BMET    Component Value Date/Time   NA 136 10/29/2019 0226   K 4.0 10/29/2019 0226   CL 105 10/29/2019 0226   CO2 19 (L) 10/29/2019 0226   GLUCOSE 170 (H) 10/29/2019 0226   BUN 15 10/29/2019 0226   CREATININE 1.25 (H) 10/29/2019 0226   CALCIUM 8.5 (L) 10/29/2019 0226   GFRNONAA 59 (L) 10/29/2019 0226   GFRAA >60 10/29/2019 0226     Intake/Output Summary (Last 24 hours) at 11/06/2019 0732 Last data filed at 11/06/2019 0447 Gross per 24 hour  Intake --  Output 2400 ml  Net -2400 ml    HOSPITAL MEDICATIONS Scheduled Meds: . allopurinol  100 mg Oral Daily  . Chlorhexidine Gluconate Cloth  6 each Topical Daily  . clopidogrel  75 mg Oral Daily  . doxycycline  100 mg Oral Q12H  . ferrous sulfate  325 mg Oral Q breakfast  . heparin injection (subcutaneous)  5,000 Units Subcutaneous Q8H  . insulin pump   Subcutaneous TID WC, HS, 0200  .  pantoprazole  40 mg Oral QHS  . pravastatin  40 mg Oral Q lunch  . pregabalin  50 mg Oral QHS  . triamterene-hydrochlorothiazide  1 tablet Oral Daily   Continuous Infusions: PRN Meds:.acetaminophen **OR** acetaminophen, alum & mag hydroxide-simeth, bisacodyl, dextrose, guaiFENesin-dextromethorphan, hydrALAZINE, HYDROcodone-acetaminophen, labetalol, metoprolol tartrate, morphine injection, ondansetron, phenol, polyethylene glycol  Assessment: Status post I&D left thigh for infected graft with subsequent disruption and bleeding and return to the OR for repair.  Chronic left foot ulcer.  He continues to improve and is ambulating better each day.    Plan: -VAC change today.  Follow-up SNF placement versus CIR. -DVT prophylaxis: Subcutaneous heparin   Risa Grill, PA-C [facility] [phone] 11/06/2019  7:32 AM   I have independently interviewed patient and agree with PA assessment and plan above.  Plan is for SNF when bed is available.  I discussed the expected outcomes with the patient and his wife at the bedside.  We will plan for p.o. antibiotics for a total of 4 weeks.  We will hold Xarelto given risk of bleeding.  Deborha Moseley C. Donzetta Matters, MD [facility] [place] Office: [phone] Pager: [phone]

## 2019-11-06 NOTE — Progress Notes (Signed)
Have requested [facility] wound VAC for VAC needs at [place] transition order form signed and has been faxed to [facility] at [facility]- per Olivia Mackie usually can have VAC in place within 24hr for transition to SNF. Per [place] is still working on [place]. - TC made to wife to update on situation and answer any questions. Have asked wife to take home wound VAC and supplies home as they have been taking supplies out of home box to do dressing changes here. Olivia Mackie with [facility] will have additional supplies for home shipped to replace the supplies used.

## 2019-11-06 NOTE — Progress Notes (Signed)
Have received word from [place] that [facility] has received Auth and plan for admission tomorrow. Spoke with Olivia Mackie at [facility]- they have been working with [facility] on wound VAC- noted it had been released per their center but for some reason [employer] they do not have it. Per Olivia Mackie with [facility] will plan to send patient to [facility] on home wound VAC that has already approved and have [facility] post acute team f/u on Monday regarding facility VAC. Spoke with wife to explain situation and ask that she bring home [facility] tomorrow when she comes to transport pt to [facility] so that staff can place pt on home vac for transition to [facility] - wife will also bring box of supplies to take to [facility].

## 2019-11-06 NOTE — Progress Notes (Signed)
Hypoglycemic Event  CBG: 52  Treatment: 8 oz juice, lunch tray  Symptoms: Asymptomatic  Follow-up CBG: Time: 1215 CBG Result: 79  Possible Reasons for Event: Pt took bolus from insulin pump too early.  Comments: Pt eating lunch now.     Arletta Bale

## 2019-11-06 NOTE — Consult Note (Signed)
[place] Nurse wound follow up Wound type: left thigh surgical wound Measurement:  Not assessed Wound bed: beefy red and moist Drainage (amount, consistency, odor) minimal serosanguinous  No odor.   Periwound: intact Dressing procedure/placement/frequency: 1 piece black foam.  Covered with drape and seal immediately achieved.  Change Mon/Wed/Fri WOC team will follow   Domenic Moras MSN, RN, FNP-BC CWON Wound, Ostomy, Continence Nurse Pager [phone]

## 2019-11-06 NOTE — Progress Notes (Signed)
Mobility Specialist: Progress Note    11/06/19 1443  Mobility  Activity Ambulated in hall  Level of Assistance Contact guard assist, steadying assist  Assistive Device Front wheel walker  Distance Ambulated (ft) 126 ft (+ 171ft + 178ft = 351ft total)  Mobility Response Tolerated well  Mobility performed by Mobility specialist  Bed Position Chair  $Mobility charge 1 Mobility   Pre-Mobility: 83 HR, 100% SpO2 During Mobility: 104 HR Post-Mobility: 87 HR, 100% SpO2  Pt tolerated ambulation well. Pt took 3 total rest breaks. Two of the rest breaks were seated lasting 1-2 minutes (see distance ambulated note above). One was a standing rest break that lasted a brief second. Pt endorses stiffness behind left knee that persisted throughout ambulation. Pt was positioned in the chair with call bell within reach.   [facility] Vamsi Apfel Mobility Specialist

## 2019-11-06 NOTE — Progress Notes (Signed)
Physical Therapy Treatment Patient Details Name: [name] MRN: [id number] DOB: 12-18-1950 Today's Date: 11/06/2019    History of Present Illness Pt is a 69 y/o male admitted secondary to abscess in LLE; s/p drainage. Pt also with new bleeding of LLE graft and is s/p emergent repair of bypass graft. PMH includes DM, CKD, HTN, and sleep apnea on bipap.     PT Comments    Pt in bed upon arrival of PT, agreeable to PT session with focus on progressing ambulation endurance. The pt was able to demo good progression of endurance as noted by increased overall distance as well as increased distance prior to need for seated rest with each bout of ambulation. The pt continues to require chair follow and is limited to 60-75 ft of ambulation prior to needing 2-5 min seated rest to recover. PT will continue to follow and work on progression of functional strength, endurance, and gait pattern prior to d/c.     Follow Up Recommendations  CIR;Supervision/Assistance - 24 hour     Equipment Recommendations  None recommended by PT    Recommendations for Other Services       Precautions / Restrictions Precautions Precautions: Fall Precaution Comments: wound vac, watch HR Restrictions Weight Bearing Restrictions: Yes RLE Weight Bearing: Weight bearing as tolerated LLE Weight Bearing: Weight bearing as tolerated    Mobility  Bed Mobility Overal bed mobility: Needs Assistance Bed Mobility: Supine to Sit     Supine to sit: Supervision Sit to supine: Supervision   General bed mobility comments: pt able to come to sitting without assist, some VC to complete transition so that hips are square. no LOB  Transfers Overall transfer level: Needs assistance Equipment used: Rolling walker (2 wheeled) Transfers: Sit to/from Stand Sit to Stand: From elevated surface;Min guard;Min assist         General transfer comment: sometimes needing minA to rise from low chair, minA for poor eccentric lower.  completed x5 through session. minG when not fatigued from chair  Ambulation/Gait Ambulation/Gait assistance: Min guard;+2 safety/equipment Gait Distance (Feet): 75 Feet(+ 60 ft + 50 ft + 60 ft + 20) Assistive device: Rolling walker (2 wheeled) Gait Pattern/deviations: Decreased weight shift to left;Antalgic;Step-through pattern;Decreased stance time - left;Decreased step length - right Gait velocity: Decreased Gait velocity interpretation: 1.31 - 2.62 ft/sec, indicative of limited community ambulator General Gait Details: Slow, antalgic gait. Pt unable to fully extend L knee or DF L ankle during stance phase, resulting in toe-only WB which improved slightly with continued ambulation. Seated rest break between each bout of ambulation; 2/4 DOE. HR 90-115bpm, SpO2 in 90s   Stairs             Wheelchair Mobility    Modified Rankin (Stroke Patients Only)       Balance Overall balance assessment: Needs assistance Sitting-balance support: No upper extremity supported;Feet supported Sitting balance-Leahy Scale: Good     Standing balance support: During functional activity Standing balance-Leahy Scale: Poor Standing balance comment: Reliant on BUE support                             Cognition Arousal/Alertness: Awake/alert Behavior During Therapy: WFL for tasks assessed/performed Overall Cognitive Status: Within Functional Limits for tasks assessed                                 General Comments:  Pt pleased with improvements in ambulation, generally flat affect but not unpleasant      Exercises      General Comments General comments (skin integrity, edema, etc.): VSS throughout, pt HR elevated to 115 with exertion, returns to 80-90 with seated rest      Pertinent Vitals/Pain Pain Assessment: Faces Faces Pain Scale: Hurts little more Pain Location: R flank, L LE (knee, ankle, wound site) Pain Descriptors / Indicators:  Grimacing;Sore;Tightness Pain Intervention(s): Monitored during session;Limited activity within patient's tolerance;Repositioned    Home Living                      Prior Function            PT Goals (current goals can now be found in the care plan section) Acute Rehab PT Goals Patient Stated Goal: to be able to walk better before going home PT Goal Formulation: With patient Time For Goal Achievement: 11/12/19 Potential to Achieve Goals: Good Progress towards PT goals: Progressing toward goals    Frequency    Min 3X/week      PT Plan Current plan remains appropriate    Co-evaluation              AM-PAC PT "6 Clicks" Mobility   Outcome Measure  Help needed turning from your back to your side while in a flat bed without using bedrails?: None Help needed moving from lying on your back to sitting on the side of a flat bed without using bedrails?: None Help needed moving to and from a bed to a chair (including a wheelchair)?: A Little Help needed standing up from a chair using your arms (e.g., wheelchair or bedside chair)?: A Little Help needed to walk in hospital room?: A Lot Help needed climbing 3-5 steps with a railing? : A Lot 6 Click Score: 18    End of Session Equipment Utilized During Treatment: Gait belt Activity Tolerance: Patient tolerated treatment well Patient left: in chair;with call bell/phone within reach;with family/visitor present Nurse Communication: Mobility status PT Visit Diagnosis: Unsteadiness on feet (R26.81);Muscle weakness (generalized) (M62.81);Pain Pain - Right/Left: Left Pain - part of body: Leg     Time: 1212-1252 PT Time Calculation (min) (ACUTE ONLY): 40 min  Charges:  $Gait Training: 38-52 mins                     Karma Ganja, PT, DPT   Acute Rehabilitation Department Pager #: [phone]   Otho Bellows 11/06/2019, 2:08 PM

## 2019-11-07 LAB — GLUCOSE, CAPILLARY
Glucose-Capillary: 112 mg/dL — ABNORMAL HIGH (ref 70–99)
Glucose-Capillary: 119 mg/dL — ABNORMAL HIGH (ref 70–99)
Glucose-Capillary: 88 mg/dL (ref 70–99)

## 2019-11-07 LAB — SARS CORONAVIRUS 2 BY RT PCR (HOSPITAL ORDER, PERFORMED IN ~~LOC~~ HOSPITAL LAB): SARS Coronavirus 2: NEGATIVE

## 2019-11-07 MED ORDER — HYDROCODONE-ACETAMINOPHEN 5-325 MG PO TABS: 1.0000 | ORAL_TABLET | Freq: Four times a day (QID) | ORAL | 0 refills | Status: DC | PRN

## 2019-11-07 MED ORDER — DOXYCYCLINE HYCLATE 100 MG PO TABS: 100.0000 mg | ORAL_TABLET | Freq: Two times a day (BID) | ORAL | 0 refills | Status: DC

## 2019-11-07 NOTE — Progress Notes (Signed)
Pt d/c to [facility] via transport by wife. IV d/c, Tele d/c, ccmd notified. Pt wound vac changed to home wound vac prior to d/c.  Amanda Cockayne, RN

## 2019-11-07 NOTE — Progress Notes (Signed)
[facility] [place]  Subjective  - feels ok leg is stiff   Objective 123/65 (!) 35 97.7 F (36.5 C) (Oral) 19 100%  Intake/Output Summary (Last 24 hours) at 11/07/2019 1345 Last data filed at 11/07/2019 1100 Gross per 24 hour  Intake 240 ml  Output 1705 ml  Net -1465 ml   VAC in place left leg Foot is warm left side  Assessment/Planning: Apparently going to SNF today Should have F/u Dr Donzetta Matters in a few weeks  Ruta Hinds 11/07/2019 1:45 PM --  Laboratory Lab Results: Recent Labs    11/06/19 0753  WBC 8.6  HGB 7.6*  HCT 24.1*  PLT 412*   BMET No results for input(s): NA, K, CL, CO2, GLUCOSE, BUN, CREATININE, CALCIUM in the last 72 hours.  COAG Lab Results  Component Value Date   INR 1.9 (H) 10/23/2019   INR 3.0 (H) 10/23/2019   INR 1.3 (H) 10/21/2019   No results found for: PTT

## 2019-11-07 NOTE — Progress Notes (Signed)
Report called to [facility] RN all questions answered.

## 2019-11-07 NOTE — TOC Progression Note (Signed)
Transition of Care [facility]) - Progression Note    Patient Details  Name: [name] MRN: [id number] Date of Birth: 12-Oct-1950  [facility]) CM/SW [place], LCSW Phone Number: 11/07/2019, 11:27 AM  Clinical Narrative:    CSW reached out to facility to secure bed readiness. CSW noticed that a new COVID was not obtained and facility stated that it was needed.  CSW alerted RN Marcell Anger that new COVID was needed for discharge.  TOC team will continue to monitor for discharge planning needs.  Expected Discharge Plan: [place] Barriers to Discharge: Continued Medical Work up  Expected Discharge Plan and Services Expected Discharge Plan: [place]   Discharge Planning Services: CM Consult Post Acute Care Choice: [place] arrangements for the past 2 months: Single Family Home Expected Discharge Date: 11/07/19               DME Arranged: Harlin Heys DME Agency: [employer] Date DME Agency Contacted: 10/27/19 Time DME Agency Contacted: [phone] Representative spoke with at DME Agency: [place]: RN, IV Antibiotics [place]: [place] Date [place]: 10/22/19 Time [place]: 1330 Representative spoke with at [place]: [place] ([place]) Interventions    Readmission Risk Interventions Readmission Risk Prevention Plan 05/09/2018 05/09/2018  Transportation Screening - Complete  PCP or Specialist Appt within 5-7 Days Complete -  Home Care Screening Complete -  Medication Review (RN CM) Complete -  Some recent data might be hidden

## 2019-11-07 NOTE — TOC Transition Note (Signed)
Transition of Care [facility]) - CM/SW Discharge Note   Patient Details  Name: [name] MRN: [id number] Date of Birth: [birth year]  [facility]) CM/SW Contact:  Bary [name], LCSW Phone Number: [phone] 11/07/2019, 2:19 PM   Clinical Narrative:    Patient will DC to:?[facility] Anticipated DC date:?11/07/19 Family notified:?wife Transport ML:JQGB   Per MD patient ready for DC to [facility]. RN, patient, patient's family, and facility notified of DC. Discharge Summary sent to facility. RN given number for report  [phone] room 905a. DC packet on chart. Wife to transport to facility.  CSW signing off.   [place], [address] [phone]    Final next level of care: Skilled Nursing Facility Barriers to Discharge: No Barriers Identified   Patient Goals and CMS Choice Patient states their goals for this hospitalization and ongoing recovery are:: return home, healing of wound CMS Medicare.gov Compare Post Acute Care list provided to:: Patient Choice offered to / list presented to : Patient  Discharge Placement              Patient chooses bed at: [facility] Patient to be transferred to facility by: Wife and approved by facility Name of family member notified: Maderia Patient and family notified of of transfer: 11/07/19  Discharge Plan and Services   Discharge Planning Services: CM Consult Post Acute Care Choice: Home Health          DME Arranged: Vac DME Agency: [employer] Date DME Agency Contacted: 10/27/19 Time DME Agency Contacted: 2010 Representative spoke with at DME Agency: [place]: RN, IV Antibiotics [place] Agency: [place] Date [place]: 10/22/19 Time [place]: 1330 Representative spoke with at [place]: [place] ([place]) Interventions     Readmission Risk Interventions Readmission Risk Prevention Plan 05/09/2018 05/09/2018  Transportation Screening - Complete  PCP  or Specialist Appt within 5-7 Days Complete -  Home Care Screening Complete -  Medication Review (RN CM) Complete -  Some recent data might be hidden

## 2019-11-13 ENCOUNTER — Other Ambulatory Visit: Payer: Self-pay | Admitting: Vascular Surgery

## 2019-11-13 DIAGNOSIS — I70229 Atherosclerosis of native arteries of extremities with rest pain, unspecified extremity: Secondary | ICD-10-CM

## 2019-11-13 DIAGNOSIS — M79662 Pain in left lower leg: Secondary | ICD-10-CM

## 2019-11-13 DIAGNOSIS — M7989 Other specified soft tissue disorders: Secondary | ICD-10-CM

## 2019-11-13 DIAGNOSIS — M79605 Pain in left leg: Secondary | ICD-10-CM

## 2019-11-27 ENCOUNTER — Encounter: Payer: Self-pay | Admitting: Vascular Surgery

## 2019-11-27 ENCOUNTER — Ambulatory Visit (INDEPENDENT_AMBULATORY_CARE_PROVIDER_SITE_OTHER): Payer: Self-pay | Admitting: Vascular Surgery

## 2019-11-27 ENCOUNTER — Other Ambulatory Visit: Payer: Self-pay

## 2019-11-27 VITALS — BP 119/79 | HR 72 | Temp 98.0°F | Resp 20 | Ht 74.0 in | Wt 238.0 lb

## 2019-11-27 DIAGNOSIS — S81802D Unspecified open wound, left lower leg, subsequent encounter: Secondary | ICD-10-CM

## 2019-11-27 DIAGNOSIS — T8189XD Other complications of procedures, not elsewhere classified, subsequent encounter: Secondary | ICD-10-CM

## 2019-11-27 NOTE — Progress Notes (Signed)
    Subjective:     Patient ID: [name], male   DOB: 09/02/1950, 69 y.o.   MRN: [id number]  HPI 68 femoral to anterior tibial bypass this is now without further bleeding.  Still has swelling in his left foot and ankle with some drainage from the left foot does not have any further pain behind the left knee.   Review of Systems As above    Objective:   Physical Exam Vitals:   11/27/19 1152  BP: 119/79  Pulse: 72  Resp: 20  Temp: 98 F (36.7 C)  SpO2: 97%   Awake alert oriented Bilateral feet are warm and well-perfused Left thigh incision nearly healed with granulation that is superficial to nearly the skin    Assessment/plan     69 year old male status post above-noted procedures.  We will get him to follow-up in 2 months at which time hopefully the wound is healed.  He can discontinue wound VAC at this time.  We will get duplex and ABIs at that time.  Patient is to continue antibiotics until completion in the next week or so.         Emmerich Cryer C. Donzetta Matters, MD [facility] [place] Office: [phone] Pager: [phone]

## 2019-12-01 ENCOUNTER — Other Ambulatory Visit: Payer: Self-pay | Admitting: *Deleted

## 2019-12-01 DIAGNOSIS — I779 Disorder of arteries and arterioles, unspecified: Secondary | ICD-10-CM

## 2019-12-01 DIAGNOSIS — I739 Peripheral vascular disease, unspecified: Secondary | ICD-10-CM

## 2019-12-24 ENCOUNTER — Other Ambulatory Visit: Payer: Self-pay

## 2019-12-24 DIAGNOSIS — I739 Peripheral vascular disease, unspecified: Secondary | ICD-10-CM

## 2019-12-24 NOTE — Progress Notes (Signed)
Letter opened in error

## 2020-01-29 ENCOUNTER — Other Ambulatory Visit: Payer: Self-pay

## 2020-01-29 ENCOUNTER — Ambulatory Visit (INDEPENDENT_AMBULATORY_CARE_PROVIDER_SITE_OTHER): Payer: BC Managed Care – PPO | Admitting: Physician Assistant

## 2020-01-29 ENCOUNTER — Ambulatory Visit (INDEPENDENT_AMBULATORY_CARE_PROVIDER_SITE_OTHER)
Admission: RE | Admit: 2020-01-29 | Discharge: 2020-01-29 | Disposition: A | Discharge status: Home / Self Care | Payer: BC Managed Care – PPO | Source: Ambulatory Visit | Attending: Surgery | Admitting: Surgery

## 2020-01-29 ENCOUNTER — Ambulatory Visit (HOSPITAL_COMMUNITY)
Admission: RE | Admit: 2020-01-29 | Discharge: 2020-01-29 | Disposition: A | Discharge status: Home / Self Care | Payer: BC Managed Care – PPO | Source: Ambulatory Visit | Attending: Surgery | Admitting: Surgery

## 2020-01-29 VITALS — BP 131/86 | HR 62 | Temp 97.6°F | Resp 20 | Ht 74.0 in | Wt 237.5 lb

## 2020-01-29 DIAGNOSIS — I779 Disorder of arteries and arterioles, unspecified: Secondary | ICD-10-CM | POA: Insufficient documentation

## 2020-01-29 DIAGNOSIS — I739 Peripheral vascular disease, unspecified: Secondary | ICD-10-CM

## 2020-01-29 DIAGNOSIS — I878 Other specified disorders of veins: Secondary | ICD-10-CM | POA: Diagnosis not present
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
## 2020-03-31 ENCOUNTER — Other Ambulatory Visit: Payer: BC Managed Care – PPO

## 2020-05-06 ENCOUNTER — Other Ambulatory Visit: Payer: Self-pay

## 2020-05-06 ENCOUNTER — Ambulatory Visit (INDEPENDENT_AMBULATORY_CARE_PROVIDER_SITE_OTHER): Payer: BC Managed Care – PPO | Admitting: Physician Assistant

## 2020-05-06 VITALS — BP 157/83 | HR 67 | Temp 97.6°F | Resp 18 | Ht 74.0 in | Wt 255.0 lb

## 2020-05-06 DIAGNOSIS — I878 Other specified disorders of veins: Secondary | ICD-10-CM

## 2020-05-06 DIAGNOSIS — I739 Peripheral vascular disease, unspecified: Secondary | ICD-10-CM

## 2020-05-06 DIAGNOSIS — M7989 Other specified soft tissue disorders: Secondary | ICD-10-CM

## 2020-05-06 DIAGNOSIS — R600 Localized edema: Secondary | ICD-10-CM

## 2020-05-06 NOTE — Progress Notes (Addendum)
POST OPERATIVE OFFICE NOTE    CC:  F/u for surgery  HPI:  This is a 69 y.o. male who is s/p left femoral to anterior tibial artery bypass with vein on July 01, 2018 by Dr. Donzetta Matters.  This past April, he developed an abscess of the left above-knee popliteal space which was surgically drained.  He subsequently developed bleeding from his vein graft which required primary repair.  Additional surgical history includes incision and drainage of left foot including subcutaneous tissue and bone by Dr. Trula Slade on September 12, 2018.  He returns today for reevaluation of his left lower extremity wounds.  He was last seen 3 months ago and he had adequate lower extremity perfusion.  He presents today with new ulceration to tip of left great toe. Left groin mass and increasing edema of his left thigh.  He has ongoing LLE eczema and venous stasis dermatitis. He denies LE pain or rest pain. No fever or chills.  He is compliant with Plavix and statin.  No Known Allergies  Current Outpatient Medications  Medication Sig Dispense Refill  . acetaminophen (TYLENOL) 650 MG CR tablet Take 1,300 mg by mouth every 8 (eight) hours as needed for pain.    Marland Kitchen allopurinol (ZYLOPRIM) 100 MG tablet Take 100 mg by mouth daily.    . clopidogrel (PLAVIX) 75 MG tablet TAKE 1 TABLET(75 MG) BY MOUTH DAILY (Patient taking differently: Take 75 mg by mouth daily. ) 90 tablet 2  . Colchicine 0.6 MG CAPS Take 0.6 mg by mouth 2 (two) times daily.     . diclofenac sodium (VOLTAREN) 1 % GEL Apply 2 g topically 4 (four) times daily.    . ferrous sulfate 325 (65 FE) MG tablet Take 1 tablet (325 mg total) by mouth 2 (two) times daily with a meal. (Patient taking differently: Take 325 mg by mouth daily with breakfast. ) 60 tablet 0  . Insulin Disposable Pump (V-GO 30) KIT Inject 20 Units into the skin continuous. Uses Novolog insulin in pump  6  . losartan (COZAAR) 100 MG tablet Take 100 mg by mouth daily.    . Multiple Vitamins-Minerals (EQL  MEGA SELECT MENS PO) Take 1 tablet by mouth daily.     Marland Kitchen NOVOLOG 100 UNIT/ML injection INJECT AS DIRECTED WITH VGO 30    . ONETOUCH ULTRA test strip SMARTSIG:Via Meter    . pravastatin (PRAVACHOL) 40 MG tablet Take 40 mg by mouth daily with lunch.   11  . pregabalin (LYRICA) 50 MG capsule Take 50 mg by mouth at bedtime.     . Semaglutide,0.25 or 0.5MG/DOS, (OZEMPIC, 0.25 OR 0.5 MG/DOSE,) 2 MG/1.5ML SOPN Inject 0.5 mg into the skin once a week.    . traMADol (ULTRAM) 50 MG tablet Take 1 tablet (50 mg total) by mouth every 6 (six) hours as needed for moderate pain. 28 tablet 0  . Travoprost, BAK Free, (TRAVATAN) 0.004 % SOLN ophthalmic solution SMARTSIG:In Eye(s)    . triamterene-hydrochlorothiazide (DYAZIDE) 37.5-25 MG capsule Take 1 capsule by mouth daily.     No current facility-administered medications for this visit.     ROS:  See HPI  Vitals:   05/06/20 1031  BP: (!) 157/83  Pulse: 67  Resp: 18  Temp: 97.6 F (36.4 C)  SpO2: 100%   Physical Exam:  General appearance: WD, WN in NAD Cardiac:RRR Respiratory:nonlabored Extremities:  Chronic LLE edema and skin changes. Medial lower leg wound is smaller, left foot amp site epithelialized but now has new  area of skin loss more laterally. His left thigh is markedly edematous with peau d'orange skin changes distally.  His medial saphenectomy incision is closed. There is a new ulcer of the medial aspect of his left great toe.  The skin overlying this is intact.  There is no drainage. He has a brisk biphasic Doppler signal of the left AT. Left groin soft tissue mass approximately 4 x 4 cm.  Soft and mobile Neuro: A and O times 4           Assessment/Plan:  This is a 69 y.o. male who is s/p: left fem to AT bypass with vein in Jan of 2020.  He presents with the following:  1) new left GT ulcer.  No exposed bone.  Advised the patient to protect this area is much as possible.  He has a Darco shoe but is unable to wear this  effectively. 2) chronic venous insufficiency with ulceration. He has not been to [facility] care recently.  We will need to refer back to the wound care center after CT scan is completed. 3) left groin soft tissue mass with marked left thigh edema.  Possible reactive lymphadenopathy.  Will obtain CT scan of abdomen pelvis with left lower extremity runoff.  Discussed case with Dr. Donzetta Matters who evaluated the patient.  Patient will return after CT scan in 1 to 2 weeks to see Dr. Donzetta Matters.  Risa Grill, PA-C [facility] [phone]  Clinic MD: Donzetta Matters

## 2020-05-09 ENCOUNTER — Ambulatory Visit (HOSPITAL_COMMUNITY)
Admission: RE | Admit: 2020-05-09 | Discharge: 2020-05-09 | Disposition: A | Discharge status: Home / Self Care | Payer: BC Managed Care – PPO | Source: Ambulatory Visit | Attending: Vascular Surgery | Admitting: Vascular Surgery

## 2020-05-09 ENCOUNTER — Other Ambulatory Visit: Payer: Self-pay

## 2020-05-09 DIAGNOSIS — I739 Peripheral vascular disease, unspecified: Secondary | ICD-10-CM | POA: Diagnosis not present

## 2020-05-09 LAB — POCT I-STAT CREATININE: Creatinine, Ser: 1.6 mg/dL — ABNORMAL HIGH (ref 0.61–1.24)

## 2020-05-09 MED ORDER — IOHEXOL 350 MG/ML SOLN
100.0000 mL | Freq: Once | INTRAVENOUS | Status: AC | PRN
  Administered 2020-05-09: 100 mL via INTRAVENOUS

## 2020-05-13 ENCOUNTER — Ambulatory Visit: Payer: BC Managed Care – PPO

## 2020-05-13 ENCOUNTER — Ambulatory Visit (INDEPENDENT_AMBULATORY_CARE_PROVIDER_SITE_OTHER): Payer: BC Managed Care – PPO | Admitting: Vascular Surgery

## 2020-05-13 ENCOUNTER — Other Ambulatory Visit: Payer: Self-pay

## 2020-05-13 ENCOUNTER — Encounter: Payer: Self-pay | Admitting: Vascular Surgery

## 2020-05-13 VITALS — BP 125/76 | HR 67 | Temp 97.7°F | Resp 20 | Ht 74.0 in | Wt 255.0 lb

## 2020-05-13 DIAGNOSIS — I878 Other specified disorders of veins: Secondary | ICD-10-CM

## 2020-05-13 DIAGNOSIS — M7989 Other specified soft tissue disorders: Secondary | ICD-10-CM

## 2020-05-13 DIAGNOSIS — I779 Disorder of arteries and arterioles, unspecified: Secondary | ICD-10-CM

## 2020-05-13 DIAGNOSIS — M79662 Pain in left lower leg: Secondary | ICD-10-CM
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
## 2020-05-17 ENCOUNTER — Other Ambulatory Visit: Payer: Self-pay

## 2020-05-17 DIAGNOSIS — I779 Disorder of arteries and arterioles, unspecified: Secondary | ICD-10-CM

## 2020-06-29 ENCOUNTER — Other Ambulatory Visit: Payer: Self-pay

## 2020-06-29 ENCOUNTER — Encounter (HOSPITAL_BASED_OUTPATIENT_CLINIC_OR_DEPARTMENT_OTHER): Payer: BC Managed Care – PPO | Attending: Physician Assistant | Admitting: Physician Assistant

## 2020-06-29 DIAGNOSIS — Z87891 Personal history of nicotine dependence: Secondary | ICD-10-CM | POA: Diagnosis not present

## 2020-06-29 DIAGNOSIS — L97522 Non-pressure chronic ulcer of other part of left foot with fat layer exposed: Secondary | ICD-10-CM | POA: Diagnosis not present

## 2020-06-29 DIAGNOSIS — N183 Chronic kidney disease, stage 3 unspecified: Secondary | ICD-10-CM | POA: Insufficient documentation

## 2020-06-29 DIAGNOSIS — L97822 Non-pressure chronic ulcer of other part of left lower leg with fat layer exposed: Secondary | ICD-10-CM | POA: Diagnosis present

## 2020-06-29 DIAGNOSIS — I129 Hypertensive chronic kidney disease with stage 1 through stage 4 chronic kidney disease, or unspecified chronic kidney disease: Secondary | ICD-10-CM | POA: Insufficient documentation

## 2020-06-29 DIAGNOSIS — Z794 Long term (current) use of insulin: Secondary | ICD-10-CM | POA: Diagnosis not present

## 2020-06-29 DIAGNOSIS — I890 Lymphedema, not elsewhere classified: Secondary | ICD-10-CM | POA: Diagnosis not present

## 2020-06-29 DIAGNOSIS — E1122 Type 2 diabetes mellitus with diabetic chronic kidney disease: Secondary | ICD-10-CM | POA: Diagnosis not present

## 2020-06-29 DIAGNOSIS — I7389 Other specified peripheral vascular diseases: Secondary | ICD-10-CM | POA: Insufficient documentation

## 2020-06-29 DIAGNOSIS — Z823 Family history of stroke: Secondary | ICD-10-CM | POA: Insufficient documentation

## 2020-06-29 DIAGNOSIS — Z8249 Family history of ischemic heart disease and other diseases of the circulatory system: Secondary | ICD-10-CM | POA: Insufficient documentation

## 2020-06-29 DIAGNOSIS — E11621 Type 2 diabetes mellitus with foot ulcer: Secondary | ICD-10-CM | POA: Diagnosis not present

## 2020-06-29 NOTE — Progress Notes (Signed)
[name], [name] ([id number]) Visit Report for 06/29/2020 Abuse/Suicide Risk Screen Details Patient Name: Date of Service: [name], [name] 06/29/2020 1:15 PM Medical Record Number: [id number] Patient Account Number: [id number] Date of Birth/Sex: Treating RN: August 16, 1950 (70 y.o. [name] Primary Care Karilynn Carranza: [name] Other Clinician: Referring Catherene Kaleta: Treating Jennelle Pinkstaff/Extender: Myles Gip in Treatment: 0 Abuse/Suicide Risk Screen Items Answer ABUSE RISK SCREEN: Has anyone close to you tried to hurt or harm you recentlyo No Do you feel uncomfortable with anyone in your familyo No Has anyone forced you do things that you didnt want to doo No Electronic Signature(s) Signed: 06/29/2020 5:17:23 PM By: Deon Pilling Entered By: Deon Pilling on 06/29/2020 13:45:11 -------------------------------------------------------------------------------- Activities of Daily Living Details Patient Name: Date of Service: [name], [name] 06/29/2020 1:15 PM Medical Record Number: [id number] Patient Account Number: [id number] Date of Birth/Sex: Treating RN: 08-06-1950 (70 y.o. [name] Primary Care Tamerra Merkley: [name] Other Clinician: Referring Garrit Marrow: Treating Doyle Kunath/Extender: Myles Gip in Treatment: 0 Activities of Daily Living Items Answer Activities of Daily Living (Please select one for each item) Drive Automobile Completely Able T Medications ake Completely Able Use T elephone Completely Able Care for Appearance Completely Able Use T oilet Completely Able Bath / Shower Completely Able Dress Self Completely Able Feed Self Completely Able Walk Completely Able Get In / Out Bed Completely Able Housework Completely Able Prepare Meals Completely Able Handle Money Completely Able Shop for Self Completely Able Electronic Signature(s) Signed: 06/29/2020 5:17:23 PM By: Deon Pilling Entered By: Deon Pilling on 06/29/2020  13:45:31 -------------------------------------------------------------------------------- Education Screening Details Patient Name: Date of Service: [name] Garfinkel. 06/29/2020 1:15 PM Medical Record Number: [id number] Patient Account Number: [id number] Date of Birth/Sex: Treating RN: 1951-06-20 (69 y.o. [name] Primary Care Semya Klinke: [name] Other Clinician: Referring Jamela Cumbo: Treating Newell Wafer/Extender: Myles Gip in Treatment: 0 Primary Learner Assessed: Patient Learning Preferences/Education Level/Primary Language Learning Preference: Explanation, Demonstration, Printed Material Highest Education Level: High School Preferred Language: English Cognitive Barrier Language Barrier: No Translator Needed: No Memory Deficit: No Emotional Barrier: No Cultural/Religious Beliefs Affecting Medical Care: No Physical Barrier Impaired Vision: Yes Glasses Impaired Hearing: No Decreased Hand dexterity: No Knowledge/Comprehension Knowledge Level: High Comprehension Level: High Ability to understand written instructions: High Ability to understand verbal instructions: High Motivation Anxiety Level: Calm Cooperation: Cooperative Education Importance: Acknowledges Need Interest in Health Problems: Asks Questions Perception: Coherent Willingness to Engage in Self-Management High Activities: Readiness to Engage in Self-Management High Activities: Electronic Signature(s) Signed: 06/29/2020 5:17:23 PM By: Deon Pilling Entered By: Deon Pilling on 06/29/2020 13:46:02 -------------------------------------------------------------------------------- Fall Risk Assessment Details Patient Name: Date of Service: [name] Garfinkel. 06/29/2020 1:15 PM Medical Record Number: [id number] Patient Account Number: [id number] Date of Birth/Sex: Treating RN: 05-Jun-1951 (69 y.o. [name], [name] Primary Care Jaeley Wiker: [name] Other Clinician: Referring  Chanc Kervin: Treating Traveion Ruddock/Extender: Myles Gip in Treatment: 0 Fall Risk Assessment Items Have you had 2 or more falls in the last 12 monthso 0 No Have you had any fall that resulted in injury in the last 12 monthso 0 No FALLS RISK SCREEN History of falling - immediate or within 3 months 0 No Secondary diagnosis (Do you have 2 or more medical diagnoseso) 0 No Ambulatory aid None/bed rest/wheelchair/nurse 0 Yes Crutches/cane/walker 0 No Furniture 0 No Intravenous therapy Access/Saline/Heparin Lock 0 No Gait/Transferring Normal/ bed rest/ wheelchair 0 Yes Weak (short steps with or  without shuffle, stooped but able to lift head while walking, may seek 0 No support from furniture) Impaired (short steps with shuffle, may have difficulty arising from chair, head down, impaired 0 No balance) Mental Status Oriented to own ability 0 Yes Electronic Signature(s) Signed: 06/29/2020 5:17:23 PM By: Deon Pilling Entered By: Deon Pilling on 06/29/2020 13:46:21 -------------------------------------------------------------------------------- Foot Assessment Details Patient Name: Date of Service: [name] Garfinkel. 06/29/2020 1:15 PM Medical Record Number: [id number] Patient Account Number: [id number] Date of Birth/Sex: Treating RN: [birth year] (70 y.o. 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 first digit left on left  foot. Electronic Signature(s) Signed: 06/29/2020 5:17:23 PM By: Deon Pilling Entered By: Deon Pilling on 06/29/2020 14:07:50 -------------------------------------------------------------------------------- Nutrition Risk Screening Details Patient Name: Date of Service: [name], [name] 06/29/2020 1:15 PM Medical Record Number: [id number] Patient Account Number: [id number] Date of Birth/Sex: Treating RN: 11/20/1950 (70 y.o. [name], [name] Primary Care Micaiah Litle: [name] Other Clinician: Referring Mosiah Bastin: Treating Marx Doig/Extender: Myles Gip in Treatment: 0 Height (in): 73 Weight (lbs): 245 Body Mass Index (BMI): 32.3 Nutrition Risk Screening Items Score Screening NUTRITION RISK SCREEN: I have an illness or condition that made me change the kind and/or amount of food I eat 2 Yes I eat fewer than two meals per day 0 No I eat few fruits and vegetables, or milk products 0 No I have three or more drinks of beer, liquor or wine almost every day 0 No I have tooth or mouth problems that make it hard for me to eat 0 No I don't always have enough money to buy the food I need 0 No I eat alone most of the time 0 No I take three or more different prescribed or over-the-counter drugs a day 1 Yes Without wanting to, I have lost or gained 10 pounds in the last six months 0 No I am not always physically able to shop, cook and/or feed myself 0 No Nutrition Protocols Good Risk Protocol Provide education on elevated blood Moderate Risk Protocol 0 sugars and impact on wound healing, as applicable High Risk Proctocol Risk Level: Moderate Risk Score: 3 Electronic Signature(s) Signed: 06/29/2020 5:17:23 PM By: Deon Pilling Entered By: Deon Pilling on 06/29/2020 13:46:32

## 2020-06-29 NOTE — Progress Notes (Signed)
ROHAN, JUENGER ([id number]) Visit Report for 06/29/2020 Allergy List Details Patient Name: Date of Service: JACOBI, NILE 06/29/2020 1:15 PM Medical Record Number: [id number] Patient Account Number: [id number] Date of Birth/Sex: Treating RN: 10-Aug-1950 (71 y.o. [name] Primary Care Tejah Brekke: [name] Other Clinician: Referring Mady Oubre: Treating Carmellia Kreisler/Extender: Myles Gip in Treatment: 0 Allergies Active Allergies No Known Drug Allergies Type: Allergen Allergy Notes Electronic Signature(s) Signed: 06/29/2020 5:17:23 PM By: Deon Pilling Entered By: Deon Pilling on 06/29/2020 13:42:35 -------------------------------------------------------------------------------- Arrival Information Details Patient Name: Date of Service: [name] 06/29/2020 1:15 PM Medical Record Number: [id number] Patient Account Number: [id number] Date of Birth/Sex: Treating RN: 11-03-1950 (70 y.o. [name] Primary Care Mariea Mcmartin: [name] Other Clinician: Referring Orestes Geiman: Treating Larken Urias/Extender: Myles Gip in Treatment: 0 Visit Information Patient Arrived: Ambulatory Arrival Time: 13:35 Accompanied By: self Transfer Assistance: None Patient Identification Verified: Yes Secondary Verification Process Completed: Yes Patient Requires Transmission-Based No Precautions: Patient Has Alerts: Yes Patient Alerts: Patient on Blood Thinner 01/29/20 LABI 1.17 TBI 0.88 01/29/20 R 1.23 History Since Last Visit All ordered tests and consults were completed: Yes Added or deleted any medications: Yes Any new allergies or adverse reactions: No Had a fall or experienced change in activities of daily living that may affect risk of falls: No Signs or symptoms of abuse/neglect since last visito No Hospitalized since last visit: Yes Implantable device outside of the clinic excluding cellular tissue based products placed in the center since  last visit: No Electronic Signature(s) Signed: 06/29/2020 5:17:23 PM By: Deon Pilling Entered By: Deon Pilling on 06/29/2020 13:41:47 -------------------------------------------------------------------------------- Clinic Level of Care Assessment Details Patient Name: Date of Service: PHILO, KURTZ 06/29/2020 1:15 PM Medical Record Number: [id number] Patient Account Number: [id number] Date of Birth/Sex: Treating RN: 25-May-1951 (70 y.o. [name] Primary Care Cornellius Kropp: [name] Other Clinician: Referring Randy Whitener: Treating Xochilth Standish/Extender: Myles Gip in Treatment: 0 Clinic Level of Care Assessment Items TOOL 1 Quantity Score X- 1 0 Use when EandM and Procedure is performed on INITIAL visit ASSESSMENTS - Nursing Assessment / Reassessment X- 1 20 General Physical Exam (combine w/ comprehensive assessment (listed just below) when performed on new pt. evals) X- 1 25 Comprehensive Assessment (HX, ROS, Risk Assessments, Wounds Hx, etc.) ASSESSMENTS - Wound and Skin Assessment / Reassessment [] - 0 Dermatologic / Skin Assessment (not related to wound area) ASSESSMENTS - Ostomy and/or Continence Assessment and Care [] - 0 Incontinence Assessment and Management [] - 0 Ostomy Care Assessment and Management (repouching, etc.) PROCESS - Coordination of Care X - Simple Patient / Family Education for ongoing care 1 15 [] - 0 Complex (extensive) Patient / Family Education for ongoing care X- 1 10 Staff obtains Programmer, systems, Records, T Results / Process Orders est [] - 0 Staff telephones HHA, Nursing Homes / Clarify orders / etc [] - 0 Routine Transfer to another Facility (non-emergent condition) [] - 0 Routine Hospital Admission (non-emergent condition) X- 1 15 New Admissions / Biomedical engineer / Ordering NPWT Apligraf, etc. , [] - 0 Emergency Hospital Admission (emergent condition) PROCESS - Special Needs [] - 0 Pediatric / Minor Patient  Management [] - 0 Isolation Patient Management [] - 0 Hearing / Language / Visual special needs [] - 0 Assessment of Community assistance (transportation, D/C planning, etc.) [] - 0 Additional assistance / Altered mentation [] - 0 Support Surface(s) Assessment (bed, cushion, seat, etc.) INTERVENTIONS -  Miscellaneous [] - 0 External ear exam [] - 0 Patient Transfer (multiple staff / Civil Service fast streamer / Similar devices) [] - 0 Simple Staple / Suture removal (25 or less) [] - 0 Complex Staple / Suture removal (26 or more) [] - 0 Hypo/Hyperglycemic Management (do not check if billed separately) [] - 0 Ankle / Brachial Index (ABI) - do not check if billed separately Has the patient been seen at the hospital within the last three years: Yes Total Score: 85 Level Of Care: New/Established - Level 3 Electronic Signature(s) Signed: 06/29/2020 5:18:45 PM By: Levan Hurst RN, BSN Signed: 06/29/2020 5:18:45 PM By: Levan Hurst RN, BSN Entered By: Levan Hurst on 06/29/2020 16:19:21 -------------------------------------------------------------------------------- Compression Therapy Details Patient Name: Date of Service: [name] 06/29/2020 1:15 PM Medical Record Number: [id number] Patient Account Number: [id number] Date of Birth/Sex: Treating RN: 1950/11/12 (70 y.o. [name] Primary Care Provider: [name] Other Clinician: Referring Provider: Treating Provider/Extender: Myles Gip in Treatment: 0 Compression Therapy Performed for Wound Assessment: Wound #10 Left,Anterior Lower Leg Performed By: Clinician Levan Hurst, RN Compression Type: Three Layer Post Procedure Diagnosis Same as Pre-procedure Electronic Signature(s) Signed: 06/29/2020 5:18:45 PM By: Levan Hurst RN, BSN Entered By: Levan Hurst on 06/29/2020 14:58:29 -------------------------------------------------------------------------------- Compression Therapy Details Patient  Name: Date of Service: [name] 06/29/2020 1:15 PM Medical Record Number: [id number] Patient Account Number: [id number] Date of Birth/Sex: Treating RN: 03/24/1951 (70 y.o. [name] Primary Care Provider: [name] Other Clinician: Referring Provider: Treating Provider/Extender: Myles Gip in Treatment: 0 Compression Therapy Performed for Wound Assessment: Wound #11 Left,Medial,Dorsal Foot Performed By: Clinician Levan Hurst, RN Compression Type: Three Layer Post Procedure Diagnosis Same as Pre-procedure Electronic Signature(s) Signed: 06/29/2020 5:18:45 PM By: Levan Hurst RN, BSN Entered By: Levan Hurst on 06/29/2020 14:58:30 -------------------------------------------------------------------------------- Compression Therapy Details Patient Name: Date of Service: [name] 06/29/2020 1:15 PM Medical Record Number: [id number] Patient Account Number: [id number] Date of Birth/Sex: Treating RN: 12/21/1950 (70 y.o. [name] Primary Care Provider: [name] Other Clinician: Referring Provider: Treating Provider/Extender: Myles Gip in Treatment: 0 Compression Therapy Performed for Wound Assessment: Wound #12 Left,Proximal,Medial Foot Performed By: Clinician Levan Hurst, RN Compression Type: Three Layer Post Procedure Diagnosis Same as Pre-procedure Electronic Signature(s) Signed: 06/29/2020 5:18:45 PM By: Levan Hurst RN, BSN Entered By: Levan Hurst on 06/29/2020 14:58:30 -------------------------------------------------------------------------------- Compression Therapy Details Patient Name: Date of Service: [name] 06/29/2020 1:15 PM Medical Record Number: [id number] Patient Account Number: [id number] Date of Birth/Sex: Treating RN: [birth year] (70 y.o. [name] Primary Care Provider: [name] Other Clinician: Referring Provider: Treating Provider/Extender:  Myles Gip in Treatment: 0 Compression Therapy Performed for Wound Assessment: Wound #13 Left,Distal,Medial Foot Performed By: Clinician Levan Hurst, RN Compression Type: Three Layer Post Procedure Diagnosis Same as Pre-procedure Electronic Signature(s) Signed: 06/29/2020 5:18:45 PM By: Levan Hurst RN, BSN Entered By: Levan Hurst on 06/29/2020 14:58:30 -------------------------------------------------------------------------------- Compression Therapy Details Patient Name: Date of Service: [name] 06/29/2020 1:15 PM Medical Record Number: [id number] Patient Account Number: [id number] Date of Birth/Sex: Treating RN: 03/25/1951 (70 y.o. [name] Primary Care Provider: [name] Other Clinician: Referring Provider: Treating Provider/Extender: Myles Gip in Treatment: 0 Compression Therapy Performed for Wound Assessment: Wound #15 Left,Lateral Foot Performed By: Clinician Levan Hurst, RN Compression Type: Three Layer Post Procedure Diagnosis Same as Pre-procedure Electronic  Signature(s) Signed: 06/29/2020 5:18:45 PM By: Levan Hurst RN, BSN Entered By: Levan Hurst on 06/29/2020 14:58:31 -------------------------------------------------------------------------------- Compression Therapy Details Patient Name: Date of Service: [name] 06/29/2020 1:15 PM Medical Record Number: [id number] Patient Account Number: [id number] Date of Birth/Sex: Treating RN: 28-Nov-1950 (70 y.o. [name] Primary Care Provider: [name] Other Clinician: Referring Provider: Treating Provider/Extender: Myles Gip in Treatment: 0 Compression Therapy Performed for Wound Assessment: Wound #16 Left Metatarsal head fifth Performed By: Clinician Levan Hurst, RN Compression Type: Three Layer Post Procedure Diagnosis Same as Pre-procedure Electronic Signature(s) Signed: 06/29/2020  5:18:45 PM By: Levan Hurst RN, BSN Entered By: Levan Hurst on 06/29/2020 14:58:31 -------------------------------------------------------------------------------- Compression Therapy Details Patient Name: Date of Service: [name] 06/29/2020 1:15 PM Medical Record Number: [id number] Patient Account Number: [id number] Date of Birth/Sex: Treating RN: [birth year] (70 y.o. [name] Primary Care Provider: [name] Other Clinician: Referring Provider: Treating Provider/Extender: Myles Gip in Treatment: 0 Compression Therapy Performed for Wound Assessment: Wound #9 Left,Medial Lower Leg Performed By: Clinician Levan Hurst, RN Compression Type: Three Layer Post Procedure Diagnosis Same as Pre-procedure Electronic Signature(s) Signed: 06/29/2020 5:18:45 PM By: Levan Hurst RN, BSN Entered By: Levan Hurst on 06/29/2020 14:58:32 -------------------------------------------------------------------------------- Encounter Discharge Information Details Patient Name: Date of Service: [name] 06/29/2020 1:15 PM Medical Record Number: [id number] Patient Account Number: [id number] Date of Birth/Sex: Treating RN: August 13, 1950 (70 y.o. [name] Primary Care Provider: [name] Other Clinician: Referring Provider: Treating Provider/Extender: Myles Gip in Treatment: 0 Encounter Discharge Information Items Post Procedure Vitals Discharge Condition: Stable Temperature (F): 97.9 Ambulatory Status: Ambulatory Pulse (bpm): 76 Discharge Destination: Home Respiratory Rate (breaths/min): 20 Transportation: Private Auto Blood Pressure (mmHg): 158/83 Accompanied By: self Schedule Follow-up Appointment: Yes Clinical Summary of Care: Electronic Signature(s) Signed: 06/29/2020 5:17:23 PM By: Deon Pilling Entered By: Deon Pilling on 06/29/2020  15:25:29 -------------------------------------------------------------------------------- Lower Extremity Assessment Details Patient Name: Date of Service: JAMARQUIS, CRULL 06/29/2020 1:15 PM Medical Record Number: [id number] Patient Account Number: [id number] Date of Birth/Sex: Treating RN: [birth year] (70 y.o. [name] Primary Care Provider: [name] Other Clinician: Referring Provider: Treating Provider/Extender: Myles Gip in Treatment: 0 Edema Assessment Assessed: [name]: Yes] [name]: No] Edema: [Left: Ye] [Right: s] Calf Left: Right: Point of Measurement: 39 cm From Medial Instep 49 cm Ankle Left: Right: Point of Measurement: 12 cm From Medial Instep 29.5 cm Knee To Floor Left: Right: From Medial Instep 49 cm Vascular Assessment Pulses: Dorsalis Pedis Palpable: [Left:Yes] Electronic Signature(s) Signed: 06/29/2020 5:17:23 PM By: Deon Pilling Entered By: Deon Pilling on 06/29/2020 14:08:33 -------------------------------------------------------------------------------- [place] Details Patient Name: Date of Service: [name] 06/29/2020 1:15 PM Medical Record Number: [id number] Patient Account Number: [id number] Date of Birth/Sex: Treating RN: [birth year] (70 y.o. [name] Primary Care Provider: [name] Other Clinician: Referring Provider: Treating Provider/Extender: Myles Gip in Treatment: 0 Active Inactive Nutrition Nursing Diagnoses: Impaired glucose control: actual or potential Potential for alteratiion in Nutrition/Potential for imbalanced nutrition Goals: Patient/caregiver agrees to and verbalizes understanding of need to use nutritional supplements and/or vitamins as prescribed Date Initiated: 06/29/2020 Target Resolution Date: 07/29/2020 Goal Status: Active Patient/caregiver will maintain therapeutic glucose control Date Initiated: 06/29/2020 Target  Resolution Date: 07/29/2020 Goal Status: Active Interventions: Assess HgA1c results as ordered upon admission and as needed Assess patient nutrition upon admission and as  needed per policy Provide education on elevated blood sugars and impact on wound healing Provide education on nutrition Notes: Venous Leg Ulcer Nursing Diagnoses: Actual venous Insuffiency (use after diagnosis is confirmed) Knowledge deficit related to disease process and management Goals: Patient will maintain optimal edema control Date Initiated: 06/29/2020 Target Resolution Date: 07/29/2020 Goal Status: Active Patient/caregiver will verbalize understanding of disease process and disease management Date Initiated: 06/29/2020 Target Resolution Date: 07/29/2020 Goal Status: Active Interventions: Assess peripheral edema status every visit. Compression as ordered Provide education on venous insufficiency Notes: Wound/Skin Impairment Nursing Diagnoses: Impaired tissue integrity Knowledge deficit related to ulceration/compromised skin integrity Goals: Patient/caregiver will verbalize understanding of skin care regimen Date Initiated: 06/29/2020 Target Resolution Date: 07/29/2020 Goal Status: Active Interventions: Assess patient/caregiver ability to obtain necessary supplies Assess patient/caregiver ability to perform ulcer/skin care regimen upon admission and as needed Assess ulceration(s) every visit Provide education on ulcer and skin care Notes: Electronic Signature(s) Signed: 06/29/2020 5:18:45 PM By: Levan Hurst RN, BSN Entered By: Levan Hurst on 06/29/2020 14:53:11 -------------------------------------------------------------------------------- Pain Assessment Details Patient Name: Date of Service: [name] 06/29/2020 1:15 PM Medical Record Number: [id number] Patient Account Number: [id number] Date of Birth/Sex: Treating RN: 08-17-1950 (70 y.o. [name] Primary Care Provider: [name] Other Clinician: Referring Provider: Treating Provider/Extender: Myles Gip in Treatment: 0 Active Problems Location of Pain Severity and Description of Pain Patient Has Paino No Site Locations Rate the pain. Current Pain Level: 0 Pain Management and Medication Current Pain Management: Medication: No Cold Application: No Rest: No Massage: No Activity: No T.E.N.S.: No Heat Application: No Leg drop or elevation: No Is the Current Pain Management Adequate: Adequate How does your wound impact your activities of daily livingo Sleep: No Bathing: No Appetite: No Relationship With Others: No Bladder Continence: No Emotions: No Bowel Continence: No Work: No Toileting: No Drive: No Dressing: No Hobbies: No Electronic Signature(s) Signed: 06/29/2020 5:17:23 PM By: Deon Pilling Entered By: Deon Pilling on 06/29/2020 13:46:51 -------------------------------------------------------------------------------- Patient/Caregiver Education Details Patient Name: Date of Service: [name] 1/5/2022andnbsp1:15 PM Medical Record Number: [id number] Patient Account Number: [id number] Date of Birth/Gender: Treating RN: [birth year] (69 y.o. [name] Primary Care Physician: [name] Other Clinician: Referring Physician: Treating Physician/Extender: Myles Gip in Treatment: 0 Education Assessment Education Provided To: Patient Education Topics Provided Elevated Blood Sugar/ Impact on Healing: Methods: Explain/Verbal Responses: State content correctly Nutrition: Methods: Explain/Verbal Responses: State content correctly Venous: Methods: Explain/Verbal Responses: State content correctly Wound/Skin Impairment: Methods: Explain/Verbal Responses: State content correctly Electronic Signature(s) Signed: 06/29/2020 5:18:45 PM By: Levan Hurst RN, BSN Entered By: Levan Hurst on 06/29/2020  14:53:37 -------------------------------------------------------------------------------- Wound Assessment Details Patient Name: Date of Service: [name] 06/29/2020 1:15 PM Medical Record Number: [id number] Patient Account Number: [id number] Date of Birth/Sex: Treating RN: 1950/09/20 (70 y.o. [name], [name] Primary Care Provider: [name] Other Clinician: Referring Provider: Treating Provider/Extender: Myles Gip in Treatment: 0 Wound Status Wound Number: 10 Primary Lymphedema Etiology: Wound Location: Left, Anterior Lower Leg Secondary Diabetic Wound/Ulcer of the Lower Extremity Wounding Event: Trauma Etiology: Date Acquired: 10/23/2019 Wound Open Weeks Of Treatment: 0 Status: Clustered Wound: No Comorbid Glaucoma, Sleep Apnea, Hypertension, Peripheral Arterial History: Disease, Peripheral Venous Disease, Type II Diabetes, Gout, Osteoarthritis, Neuropathy Wound Measurements Length: (cm) 1.2 Width: (cm) 0.8 Depth: (cm) 0.1 Area: (cm) 0.754 Volume: (cm) 0.075 % Reduction in Area: % Reduction in Volume: Epithelialization: Small (1-33%)  Tunneling: No Undermining: No Wound Description Classification: Full Thickness Without Exposed Support Structures Wound Margin: Distinct, outline attached Exudate Amount: Medium Exudate Type: Serosanguineous Exudate Color: red, brown Foul Odor After Cleansing: No Slough/Fibrino Yes Wound Bed Granulation Amount: Small (1-33%) Exposed Structure Granulation Quality: Pink Fascia Exposed: No Necrotic Amount: Large (67-100%) Fat Layer (Subcutaneous Tissue) Exposed: Yes Necrotic Quality: Adherent Slough Tendon Exposed: No Muscle Exposed: No Joint Exposed: No Bone Exposed: No Treatment Notes Wound #10 (Lower Leg) Wound Laterality: Left, Anterior Cleanser Soap and Water Discharge Instruction: May shower and wash wound with dial antibacterial soap and water prior to dressing change. Peri-Wound  Care Sween Lotion (Moisturizing lotion) Discharge Instruction: Apply moisturizing lotion as directed Topical Primary Dressing Promogran Prisma Matrix, 4.34 (sq in) (silver collagen) Discharge Instruction: Moisten collagen with saline or hydrogel Secondary Dressing ABD Pad, 5x9 Discharge Instruction: Apply over primary dressing as directed. Zetuvit Plus 4x8 in Discharge Instruction: Apply over primary dressing as directed. Secured With Compression Wrap ThreePress (3 layer compression wrap) Discharge Instruction: Apply three layer compression as directed. Unna boot under wrap to help secure in place Compression Stockings Add-Ons Electronic Signature(s) Signed: 06/29/2020 5:17:23 PM By: Deon Pilling Entered By: Deon Pilling on 06/29/2020 14:11:23 -------------------------------------------------------------------------------- Wound Assessment Details Patient Name: Date of Service: ELIC, VENCILL 06/29/2020 1:15 PM Medical Record Number: [id number] Patient Account Number: [id number] Date of Birth/Sex: Treating RN: [birth year] (70 y.o. [name], [name] Primary Care Provider: [name] Other Clinician: Referring Provider: Treating Provider/Extender: Myles Gip in Treatment: 0 Wound Status Wound Number: 11 Primary Diabetic Wound/Ulcer of the Lower Extremity Etiology: Wound Location: Left, Medial, Dorsal Foot Secondary Lymphedema Wounding Event: Trauma Etiology: Date Acquired: 05/25/2020 Wound Open Weeks Of Treatment: 0 Status: Clustered Wound: No Comorbid Glaucoma, Sleep Apnea, Hypertension, Peripheral Arterial History: Disease, Peripheral Venous Disease, Type II Diabetes, Gout, Osteoarthritis, Neuropathy Wound Measurements Length: (cm) 1 Width: (cm) 0.8 Depth: (cm) 0.1 Area: (cm) 0.628 Volume: (cm) 0.063 % Reduction in Area: % Reduction in Volume: Epithelialization: None Tunneling: No Undermining: No Wound Description Classification:  Grade 2 Wound Margin: Distinct, outline attached Exudate Amount: Medium Exudate Type: Serosanguineous Exudate Color: red, brown Wound Bed Granulation Amount: Large (67-100%) Granulation Quality: Pink, Pale Necrotic Amount: None Present (0%) Foul Odor After Cleansing: No Slough/Fibrino No Exposed Structure Fascia Exposed: No Fat Layer (Subcutaneous Tissue) Exposed: Yes Tendon Exposed: No Muscle Exposed: No Joint Exposed: No Bone Exposed: No Treatment Notes Wound #11 (Foot) Wound Laterality: Dorsal, Left, Medial Cleanser Soap and Water Discharge Instruction: May shower and wash wound with dial antibacterial soap and water prior to dressing change. Peri-Wound Care Sween Lotion (Moisturizing lotion) Discharge Instruction: Apply moisturizing lotion as directed Topical Primary Dressing Promogran Prisma Matrix, 4.34 (sq in) (silver collagen) Discharge Instruction: Moisten collagen with saline or hydrogel Secondary Dressing Woven Gauze Sponge, Non-Sterile 4x4 in Discharge Instruction: Apply over primary dressing as directed. Secured With Compression Wrap ThreePress (3 layer compression wrap) Discharge Instruction: Apply three layer compression as directed. Compression Stockings Add-Ons Electronic Signature(s) Signed: 06/29/2020 5:17:23 PM By: Deon Pilling Entered By: Deon Pilling on 06/29/2020 14:12:27 -------------------------------------------------------------------------------- Wound Assessment Details Patient Name: Date of Service: HILBERT, BRIGGS 06/29/2020 1:15 PM Medical Record Number: [id number] Patient Account Number: [id number] Date of Birth/Sex: Treating RN: 09-19-1950 (70 y.o. [name] Primary Care Provider: [name] Other Clinician: Referring Provider: Treating Provider/Extender: Myles Gip in Treatment: 0 Wound Status Wound Number: 12 Primary Diabetic Wound/Ulcer of the Lower Extremity Etiology:  Wound Location: Left,  Proximal, Medial Foot Wound Open Wounding Event: Trauma Status: Date Acquired: 05/25/2020 Comorbid Glaucoma, Sleep Apnea, Hypertension, Peripheral Arterial Disease, Weeks Of Treatment: 0 History: Peripheral Venous Disease, Type II Diabetes, Gout, Osteoarthritis, Clustered Wound: No Neuropathy Wound Measurements Length: (cm) 0.9 Width: (cm) 0.6 Depth: (cm) 0.2 Area: (cm) 0.424 Volume: (cm) 0.085 % Reduction in Area: % Reduction in Volume: Epithelialization: None Tunneling: No Undermining: No Wound Description Classification: Grade 2 Wound Margin: Distinct, outline attached Exudate Amount: Medium Exudate Type: Serosanguineous Exudate Color: red, brown Foul Odor After Cleansing: No Slough/Fibrino No Wound Bed Granulation Amount: Large (67-100%) Exposed Structure Granulation Quality: Red Fascia Exposed: No Necrotic Amount: None Present (0%) Fat Layer (Subcutaneous Tissue) Exposed: Yes Tendon Exposed: No Muscle Exposed: No Joint Exposed: No Bone Exposed: No Assessment Notes callous noted to periwound. Treatment Notes Wound #12 (Foot) Wound Laterality: Left, Medial, Proximal Cleanser Soap and Water Discharge Instruction: May shower and wash wound with dial antibacterial soap and water prior to dressing change. Peri-Wound Care Sween Lotion (Moisturizing lotion) Discharge Instruction: Apply moisturizing lotion as directed Topical Primary Dressing Promogran Prisma Matrix, 4.34 (sq in) (silver collagen) Discharge Instruction: Moisten collagen with saline or hydrogel Secondary Dressing Woven Gauze Sponge, Non-Sterile 4x4 in Discharge Instruction: Apply over primary dressing as directed. Secured With Compression Wrap ThreePress (3 layer compression wrap) Discharge Instruction: Apply three layer compression as directed. Compression Stockings Add-Ons Electronic Signature(s) Signed: 06/29/2020 5:17:23 PM By: Deon Pilling Entered By: Deon Pilling on 06/29/2020  14:13:34 -------------------------------------------------------------------------------- Wound Assessment Details Patient Name: Date of Service: CHADEN, DOOM 06/29/2020 1:15 PM Medical Record Number: [id number] Patient Account Number: [id number] Date of Birth/Sex: Treating RN: May 28, 1951 (70 y.o. [name], [name] Primary Care Provider: [name] Other Clinician: Referring Provider: Treating Provider/Extender: Myles Gip in Treatment: 0 Wound Status Wound Number: 13 Primary Diabetic Wound/Ulcer of the Lower Extremity Etiology: Wound Location: Left, Distal, Medial Foot Wound Open Wounding Event: Trauma Status: Date Acquired: 05/25/2020 Comorbid Glaucoma, Sleep Apnea, Hypertension, Peripheral Arterial Disease, Weeks Of Treatment: 0 History: Peripheral Venous Disease, Type II Diabetes, Gout, Osteoarthritis, Clustered Wound: No Neuropathy Wound Measurements Length: (cm) 1 Width: (cm) 0.3 Depth: (cm) 0.1 Area: (cm) 0.236 Volume: (cm) 0.024 % Reduction in Area: % Reduction in Volume: Epithelialization: None Tunneling: No Undermining: No Wound Description Classification: Grade 2 Wound Margin: Distinct, outline attached Exudate Amount: Medium Exudate Type: Serosanguineous Exudate Color: red, brown Foul Odor After Cleansing: No Slough/Fibrino No Wound Bed Granulation Amount: Large (67-100%) Exposed Structure Granulation Quality: Pink, Pale Fascia Exposed: No Necrotic Amount: None Present (0%) Fat Layer (Subcutaneous Tissue) Exposed: Yes Tendon Exposed: No Muscle Exposed: No Joint Exposed: No Bone Exposed: No Assessment Notes callous noted to periwound. Treatment Notes Wound #13 (Foot) Wound Laterality: Left, Medial, Distal Cleanser Soap and Water Discharge Instruction: May shower and wash wound with dial antibacterial soap and water prior to dressing change. Peri-Wound Care Sween Lotion (Moisturizing lotion) Discharge Instruction:  Apply moisturizing lotion as directed Topical Primary Dressing Promogran Prisma Matrix, 4.34 (sq in) (silver collagen) Discharge Instruction: Moisten collagen with saline or hydrogel Secondary Dressing Woven Gauze Sponge, Non-Sterile 4x4 in Discharge Instruction: Apply over primary dressing as directed. Secured With Compression Wrap ThreePress (3 layer compression wrap) Discharge Instruction: Apply three layer compression as directed. Compression Stockings Add-Ons Electronic Signature(s) Signed: 06/29/2020 5:17:23 PM By: Deon Pilling Entered By: Deon Pilling on 06/29/2020 14:14:37 -------------------------------------------------------------------------------- Wound Assessment Details Patient Name: Date of Service: [name] 06/29/2020 1:15 PM  Medical Record Number: [id number] Patient Account Number: [id number] Date of Birth/Sex: Treating RN: 1950/06/28 (70 y.o. [name], [name] Primary Care Huxton Glaus: [name] Other Clinician: Referring Jak Haggar: Treating Amariyana Heacox/Extender: Myles Gip in Treatment: 0 Wound Status Wound Number: 14 Primary Diabetic Wound/Ulcer of the Lower Extremity Etiology: Wound Location: Left T Great oe Wound Open Wounding Event: Trauma Status: Date Acquired: 05/25/2020 Comorbid Glaucoma, Sleep Apnea, Hypertension, Peripheral Arterial Disease, Weeks Of Treatment: 0 History: Peripheral Venous Disease, Type II Diabetes, Gout, Osteoarthritis, Clustered Wound: No Neuropathy Wound Measurements Length: (cm) 0.5 Width: (cm) 0.3 Depth: (cm) 0.1 Area: (cm) 0.118 Volume: (cm) 0.012 % Reduction in Area: % Reduction in Volume: Epithelialization: None Tunneling: No Undermining: No Wound Description Classification: Grade 2 Wound Margin: Distinct, outline attached Exudate Amount: Medium Exudate Type: Serosanguineous Exudate Color: red, brown Foul Odor After Cleansing: No Slough/Fibrino No Wound Bed Granulation  Amount: Large (67-100%) Exposed Structure Granulation Quality: Red Fascia Exposed: No Necrotic Amount: None Present (0%) Fat Layer (Subcutaneous Tissue) Exposed: Yes Tendon Exposed: No Muscle Exposed: No Joint Exposed: No Bone Exposed: No Assessment Notes callous noted to periwound. Treatment Notes Wound #14 (Toe Great) Wound Laterality: Left Cleanser Normal Saline Discharge Instruction: Cleanse the wound with Normal Saline prior to applying a clean dressing using gauze sponges, not tissue or cotton balls. [employer] Ancillary Kit - 15 Day Supply Discharge Instruction: Use supplies as instructed; Kit contains: (15) Saline Bullets; (15) 3x3 Gauze; 15 pr Gloves Peri-Wound Care Topical Primary Dressing Promogran Prisma Matrix, 4.34 (sq in) (silver collagen) Discharge Instruction: Moisten collagen with saline or hydrogel Secondary Dressing Woven Gauze Sponges 2x2 in Discharge Instruction: Apply over primary dressing as directed. Secured With Conforming Stretch Gauze Bandage, Sterile 2x75 (in/in) Discharge Instruction: Secure with stretch gauze as directed. Paper Tape, 1x10 (in/yd) Discharge Instruction: Secure dressing with tape as directed. Compression Wrap Compression Stockings Add-Ons Electronic Signature(s) Signed: 06/29/2020 5:17:23 PM By: Deon Pilling Entered By: Deon Pilling on 06/29/2020 14:15:22 -------------------------------------------------------------------------------- Wound Assessment Details Patient Name: Date of Service: KORAN, SEABROOK 06/29/2020 1:15 PM Medical Record Number: [id number] Patient Account Number: [id number] Date of Birth/Sex: Treating RN: October 07, 1950 (70 y.o. [name], [name] Primary Care Arthelia Callicott: [name] Other Clinician: Referring Coda Mathey: Treating Anquan Azzarello/Extender: Myles Gip in Treatment: 0 Wound Status Wound Number: 15 Primary Diabetic Wound/Ulcer of the Lower Extremity Etiology: Wound Location: Left,  Lateral Foot Secondary Lymphedema Wounding Event: Trauma Etiology: Date Acquired: 05/25/2020 Wound Open Weeks Of Treatment: 0 Status: Clustered Wound: No Comorbid Glaucoma, Sleep Apnea, Hypertension, Peripheral Arterial History: Disease, Peripheral Venous Disease, Type II Diabetes, Gout, Osteoarthritis, Neuropathy Wound Measurements Length: (cm) 1 Width: (cm) 0.8 Depth: (cm) 0.2 Area: (cm) 0.628 Volume: (cm) 0.126 % Reduction in Area: % Reduction in Volume: Epithelialization: None Tunneling: No Undermining: No Wound Description Classification: Grade 2 Wound Margin: Distinct, outline attached Exudate Amount: Medium Exudate Type: Serosanguineous Exudate Color: red, brown Foul Odor After Cleansing: No Slough/Fibrino No Wound Bed Granulation Amount: Large (67-100%) Exposed Structure Granulation Quality: Red Fascia Exposed: No Necrotic Amount: None Present (0%) Fat Layer (Subcutaneous Tissue) Exposed: Yes Tendon Exposed: No Muscle Exposed: No Joint Exposed: No Bone Exposed: No Assessment Notes callous noted to periwound. Treatment Notes Wound #15 (Foot) Wound Laterality: Left, Lateral Cleanser Soap and Water Discharge Instruction: May shower and wash wound with dial antibacterial soap and water prior to dressing change. Peri-Wound Care Sween Lotion (Moisturizing lotion) Discharge Instruction: Apply moisturizing lotion as directed Topical Primary Dressing Promogran Prisma Matrix, 4.34 (  sq in) (silver collagen) Discharge Instruction: Moisten collagen with saline or hydrogel Secondary Dressing Woven Gauze Sponge, Non-Sterile 4x4 in Discharge Instruction: Apply over primary dressing as directed. Secured With Compression Wrap ThreePress (3 layer compression wrap) Discharge Instruction: Apply three layer compression as directed. Compression Stockings Add-Ons Electronic Signature(s) Signed: 06/29/2020 5:17:23 PM By: Deon Pilling Entered By: Deon Pilling on  06/29/2020 14:16:30 -------------------------------------------------------------------------------- Wound Assessment Details Patient Name: Date of Service: DIONTAY, ROSENCRANS 06/29/2020 1:15 PM Medical Record Number: [id number] Patient Account Number: [id number] Date of Birth/Sex: Treating RN: 1951-02-27 (70 y.o. [name], [name] Primary Care Provider: [name] Other Clinician: Referring Provider: Treating Provider/Extender: Myles Gip in Treatment: 0 Wound Status Wound Number: 16 Primary Diabetic Wound/Ulcer of the Lower Extremity Etiology: Wound Location: Left Metatarsal head fifth Wound Open Wounding Event: Trauma Status: Date Acquired: 05/25/2020 Comorbid Glaucoma, Sleep Apnea, Hypertension, Peripheral Arterial Disease, Weeks Of Treatment: 0 History: Peripheral Venous Disease, Type II Diabetes, Gout, Osteoarthritis, Clustered Wound: No Neuropathy Wound Measurements Length: (cm) 0.6 Width: (cm) 0.4 Depth: (cm) 0.1 Area: (cm) 0.188 Volume: (cm) 0.019 % Reduction in Area: % Reduction in Volume: Epithelialization: None Tunneling: No Undermining: No Wound Description Classification: Grade 2 Wound Margin: Distinct, outline attached Exudate Amount: Medium Exudate Type: Serosanguineous Exudate Color: red, brown Foul Odor After Cleansing: No Slough/Fibrino No Wound Bed Granulation Amount: Large (67-100%) Exposed Structure Necrotic Amount: None Present (0%) Fascia Exposed: No Fat Layer (Subcutaneous Tissue) Exposed: Yes Tendon Exposed: No Muscle Exposed: No Joint Exposed: No Bone Exposed: No Treatment Notes Wound #16 (Metatarsal head fifth) Wound Laterality: Left Cleanser Soap and Water Discharge Instruction: May shower and wash wound with dial antibacterial soap and water prior to dressing change. Peri-Wound Care Sween Lotion (Moisturizing lotion) Discharge Instruction: Apply moisturizing lotion as directed Topical Primary  Dressing Promogran Prisma Matrix, 4.34 (sq in) (silver collagen) Discharge Instruction: Moisten collagen with saline or hydrogel Secondary Dressing Woven Gauze Sponge, Non-Sterile 4x4 in Discharge Instruction: Apply over primary dressing as directed. Secured With Compression Wrap ThreePress (3 layer compression wrap) Discharge Instruction: Apply three layer compression as directed. Compression Stockings Add-Ons Electronic Signature(s) Signed: 06/29/2020 5:17:23 PM By: Deon Pilling Entered By: Deon Pilling on 06/29/2020 14:17:22 -------------------------------------------------------------------------------- Wound Assessment Details Patient Name: Date of Service: MARIANO, DOSHI 06/29/2020 1:15 PM Medical Record Number: [id number] Patient Account Number: [id number] Date of Birth/Sex: Treating RN: 1950/11/30 (70 y.o. [name], [name] Primary Care Provider: [name] Other Clinician: Referring Provider: Treating Provider/Extender: Myles Gip in Treatment: 0 Wound Status Wound Number: 9 Primary Open Surgical Wound Etiology: Wound Location: Left, Medial Lower Leg Secondary Lymphedema Wounding Event: Surgical Injury Etiology: Date Acquired: 10/23/2019 Wound Open Weeks Of Treatment: 0 Status: Clustered Wound: No Comorbid Glaucoma, Sleep Apnea, Hypertension, Peripheral Arterial History: Disease, Peripheral Venous Disease, Type II Diabetes, Gout, Osteoarthritis, Neuropathy Wound Measurements Length: (cm) 2 Width: (cm) 1 Depth: (cm) 0.4 Area: (cm) 1.571 Volume: (cm) 0.628 % Reduction in Area: % Reduction in Volume: Epithelialization: Small (1-33%) Tunneling: No Undermining: No Wound Description Classification: Full Thickness Without Exposed Support Struc Wound Margin: Distinct, outline attached Exudate Amount: Medium Exudate Type: Serosanguineous Exudate Color: red, brown tures Foul Odor After Cleansing: No Slough/Fibrino Yes Wound  Bed Granulation Amount: Medium (34-66%) Exposed Structure Granulation Quality: Red, Pink Fascia Exposed: No Necrotic Amount: Medium (34-66%) Fat Layer (Subcutaneous Tissue) Exposed: Yes Necrotic Quality: Adherent Slough Tendon Exposed: No Muscle Exposed: No Joint Exposed: No Bone Exposed: No Treatment Notes Wound #9 (  Lower Leg) Wound Laterality: Left, Medial Cleanser Soap and Water Discharge Instruction: May shower and wash wound with dial antibacterial soap and water prior to dressing change. Peri-Wound Care Sween Lotion (Moisturizing lotion) Discharge Instruction: Apply moisturizing lotion as directed Topical Primary Dressing Promogran Prisma Matrix, 4.34 (sq in) (silver collagen) Discharge Instruction: Moisten collagen with saline or hydrogel Secondary Dressing ABD Pad, 5x9 Discharge Instruction: Apply over primary dressing as directed. Zetuvit Plus 4x8 in Discharge Instruction: Apply over primary dressing as directed. Secured With Compression Wrap ThreePress (3 layer compression wrap) Discharge Instruction: Apply three layer compression as directed. Unna boot under wrap to help secure in place Compression Stockings Add-Ons Electronic Signature(s) Signed: 06/29/2020 5:17:23 PM By: Deon Pilling Entered By: Deon Pilling on 06/29/2020 14:09:49 -------------------------------------------------------------------------------- Vitals Details Patient Name: Date of Service: [name] 06/29/2020 1:15 PM Medical Record Number: [id number] Patient Account Number: [id number] Date of Birth/Sex: Treating RN: 1950/10/17 (70 y.o. [name], [name] Primary Care Eon Zunker: [name] Other Clinician: Referring Lysette Lindenbaum: Treating Muhamad Serano/Extender: Myles Gip in Treatment: 0 Vital Signs Time Taken: 13:35 Temperature (F): 97.9 Height (in): 73 Pulse (bpm): 76 Source: Stated Respiratory Rate (breaths/min): 20 Weight (lbs): 245 Blood Pressure  (mmHg): 158/83 Source: Stated Capillary Blood Glucose (mg/dl): 159 Body Mass Index (BMI): 32.3 Reference Range: 80 - 120 mg / dl Electronic Signature(s) Signed: 06/29/2020 5:17:23 PM By: Deon Pilling Entered By: Deon Pilling on 06/29/2020 13:42:46

## 2020-06-29 NOTE — Progress Notes (Signed)
KANIN, LIA ([id number]) Visit Report for 06/29/2020 Chief Complaint Document Details Patient Name: Date of Service: [name], [name] 06/29/2020 1:15 PM Medical Record Number: [id number] Patient Account Number: [id number] Date of Birth/Sex: Treating RN: 07-27-1950 (70 y.o. [name] Primary Care Provider: [name] Other Clinician: Referring Provider: Treating Provider/Extender: Myles Gip in Treatment: 0 Information Obtained from: Patient Chief Complaint Left leg and foot ulcers Electronic Signature(s) Signed: 06/29/2020 2:45:54 PM By: Worthy Keeler PA-C Entered By: Worthy Keeler on 06/29/2020 14:45:53 -------------------------------------------------------------------------------- Debridement Details Patient Name: Date of Service: [name]. 06/29/2020 1:15 PM Medical Record Number: [id number] Patient Account Number: [id number] Date of Birth/Sex: Treating RN: 11-13-1950 (70 y.o. [name] Primary Care Provider: [name] Other Clinician: Referring Provider: Treating Provider/Extender: Myles Gip in Treatment: 0 Debridement Performed for Assessment: Wound #12 Left,Proximal,Medial Foot Performed By: Physician Worthy Keeler, PA Debridement Type: Debridement Severity of Tissue Pre Debridement: Fat layer exposed Level of Consciousness (Pre-procedure): Awake and Alert Pre-procedure Verification/Time Out Yes - 14:55 Taken: Start Time: 14:55 T Area Debrided (L x W): otal 0.9 (cm) x 0.6 (cm) = 0.54 (cm) Tissue and other material debrided: Viable, Non-Viable, Callus, Subcutaneous Level: Skin/Subcutaneous Tissue Debridement Description: Excisional Instrument: Curette Bleeding: Minimum Hemostasis Achieved: Pressure End Time: 14:57 Procedural Pain: 0 Post Procedural Pain: 0 Response to Treatment: Procedure was tolerated well Level of Consciousness (Post- Awake and Alert procedure): Post Debridement  Measurements of Total Wound Length: (cm) 0.9 Width: (cm) 0.6 Depth: (cm) 0.2 Volume: (cm) 0.085 Character of Wound/Ulcer Post Debridement: Improved Severity of Tissue Post Debridement: Fat layer exposed Post Procedure Diagnosis Same as Pre-procedure Electronic Signature(s) Signed: 06/29/2020 4:44:38 PM By: Worthy Keeler PA-C Signed: 06/29/2020 5:18:45 PM By: Levan Hurst RN, BSN Entered By: Levan Hurst on 06/29/2020 14:57:23 -------------------------------------------------------------------------------- Debridement Details Patient Name: Date of Service: [name]. 06/29/2020 1:15 PM Medical Record Number: [id number] Patient Account Number: [id number] Date of Birth/Sex: Treating RN: Aug 18, 1950 (70 y.o. [name] Primary Care Provider: [name] Other Clinician: Referring Provider: Treating Provider/Extender: Myles Gip in Treatment: 0 Debridement Performed for Assessment: Wound #14 Left T Great oe Performed By: Physician Worthy Keeler, PA Debridement Type: Debridement Severity of Tissue Pre Debridement: Fat layer exposed Level of Consciousness (Pre-procedure): Awake and Alert Pre-procedure Verification/Time Out Yes - 14:55 Taken: Start Time: 14:55 T Area Debrided (L x W): otal 0.5 (cm) x 0.3 (cm) = 0.15 (cm) Tissue and other material debrided: Viable, Non-Viable, Callus, Subcutaneous Level: Skin/Subcutaneous Tissue Debridement Description: Excisional Instrument: Curette Bleeding: Minimum Hemostasis Achieved: Pressure End Time: 14:57 Procedural Pain: 0 Post Procedural Pain: 0 Response to Treatment: Procedure was tolerated well Level of Consciousness (Post- Awake and Alert procedure): Post Debridement Measurements of Total Wound Length: (cm) 0.5 Width: (cm) 0.3 Depth: (cm) 0.1 Volume: (cm) 0.012 Character of Wound/Ulcer Post Debridement: Improved Severity of Tissue Post Debridement: Fat layer exposed Post Procedure  Diagnosis Same as Pre-procedure Electronic Signature(s) Signed: 06/29/2020 4:44:38 PM By: Worthy Keeler PA-C Signed: 06/29/2020 5:18:45 PM By: Levan Hurst RN, BSN Entered By: Levan Hurst on 06/29/2020 14:58:13 -------------------------------------------------------------------------------- Debridement Details Patient Name: Date of Service: [name]. 06/29/2020 1:15 PM Medical Record Number: [id number] Patient Account Number: [id number] Date of Birth/Sex: Treating RN: 03-Sep-1950 (70 y.o. [name] Primary Care Provider: [name] Other Clinician: Referring Provider: Treating Provider/Extender: Myles Gip in Treatment: 0 Debridement  Performed for Assessment: Wound #15 Left,Lateral Foot Performed By: Physician Worthy Keeler, PA Debridement Type: Debridement Severity of Tissue Pre Debridement: Fat layer exposed Level of Consciousness (Pre-procedure): Awake and Alert Pre-procedure Verification/Time Out Yes - 14:55 Taken: Start Time: 14:55 T Area Debrided (L x W): otal 1 (cm) x 0.8 (cm) = 0.8 (cm) Tissue and other material debrided: Viable, Non-Viable, Callus, Subcutaneous Level: Skin/Subcutaneous Tissue Debridement Description: Excisional Instrument: Curette Bleeding: Minimum Hemostasis Achieved: Pressure End Time: 14:57 Procedural Pain: 0 Post Procedural Pain: 0 Response to Treatment: Procedure was tolerated well Level of Consciousness (Post- Awake and Alert procedure): Post Debridement Measurements of Total Wound Length: (cm) 1 Width: (cm) 0.8 Depth: (cm) 0.2 Volume: (cm) 0.126 Character of Wound/Ulcer Post Debridement: Improved Severity of Tissue Post Debridement: Fat layer exposed Post Procedure Diagnosis Same as Pre-procedure Electronic Signature(s) Signed: 06/29/2020 4:44:38 PM By: Worthy Keeler PA-C Signed: 06/29/2020 5:18:45 PM By: Levan Hurst RN, BSN Entered By: Levan Hurst on 06/29/2020  15:01:15 -------------------------------------------------------------------------------- Debridement Details Patient Name: Date of Service: [name]. 06/29/2020 1:15 PM Medical Record Number: [id number] Patient Account Number: [id number] Date of Birth/Sex: Treating RN: April 04, 1951 (70 y.o. [name] Primary Care Provider: [name] Other Clinician: Referring Provider: Treating Provider/Extender: Myles Gip in Treatment: 0 Debridement Performed for Assessment: Wound #16 Left Metatarsal head fifth Performed By: Physician Worthy Keeler, PA Debridement Type: Debridement Severity of Tissue Pre Debridement: Fat layer exposed Level of Consciousness (Pre-procedure): Awake and Alert Pre-procedure Verification/Time Out Yes - 14:55 Taken: Start Time: 14:55 T Area Debrided (L x W): otal 0.6 (cm) x 0.4 (cm) = 0.24 (cm) Tissue and other material debrided: Viable, Non-Viable, Callus, Subcutaneous Level: Skin/Subcutaneous Tissue Debridement Description: Excisional Instrument: Curette Bleeding: Minimum Hemostasis Achieved: Pressure End Time: 14:57 Procedural Pain: 0 Post Procedural Pain: 0 Response to Treatment: Procedure was tolerated well Level of Consciousness (Post- Awake and Alert procedure): Post Debridement Measurements of Total Wound Length: (cm) 0.6 Width: (cm) 0.4 Depth: (cm) 0.1 Volume: (cm) 0.019 Character of Wound/Ulcer Post Debridement: Improved Severity of Tissue Post Debridement: Fat layer exposed Post Procedure Diagnosis Same as Pre-procedure Electronic Signature(s) Signed: 06/29/2020 4:44:38 PM By: Worthy Keeler PA-C Signed: 06/29/2020 5:18:45 PM By: Levan Hurst RN, BSN Entered By: Levan Hurst on 06/29/2020 15:02:00 -------------------------------------------------------------------------------- Debridement Details Patient Name: Date of Service: [name]. 06/29/2020 1:15 PM Medical Record Number:  [id number] Patient Account Number: [id number] Date of Birth/Sex: Treating RN: 09/11/1950 (70 y.o. [name] Primary Care Provider: [name] Other Clinician: Referring Provider: Treating Provider/Extender: Myles Gip in Treatment: 0 Debridement Performed for Assessment: Wound #11 Left,Medial,Dorsal Foot Performed By: Physician Worthy Keeler, PA Debridement Type: Debridement Severity of Tissue Pre Debridement: Fat layer exposed Level of Consciousness (Pre-procedure): Awake and Alert Pre-procedure Verification/Time Out Yes - 14:55 Taken: Start Time: 14:55 T Area Debrided (L x W): otal 1 (cm) x 0.8 (cm) = 0.8 (cm) Tissue and other material debrided: Viable, Non-Viable, Callus, Subcutaneous Level: Skin/Subcutaneous Tissue Debridement Description: Excisional Instrument: Curette Bleeding: Minimum Hemostasis Achieved: Pressure End Time: 14:57 Procedural Pain: 0 Post Procedural Pain: 0 Response to Treatment: Procedure was tolerated well Level of Consciousness (Post- Awake and Alert procedure): Post Debridement Measurements of Total Wound Length: (cm) 1 Width: (cm) 0.8 Depth: (cm) 0.1 Volume: (cm) 0.063 Character of Wound/Ulcer Post Debridement: Improved Severity of Tissue Post Debridement: Fat layer exposed Post Procedure Diagnosis Same as Pre-procedure Electronic Signature(s) Signed: 06/29/2020 4:44:38 PM  By: Worthy Keeler PA-C Signed: 06/29/2020 5:18:45 PM By: Levan Hurst RN, BSN Entered By: Levan Hurst on 06/29/2020 15:02:33 -------------------------------------------------------------------------------- HPI Details Patient Name: Date of Service: [name]. 06/29/2020 1:15 PM Medical Record Number: [id number] Patient Account Number: [id number] Date of Birth/Sex: Treating RN: May 10, 1951 (70 y.o. [name] Primary Care Provider: [name] Other Clinician: Referring Provider: Treating Provider/Extender: Myles Gip in Treatment: 0 History of Present Illness HPI Description: 10/11/17; Mr. Montellano is a 70 year old man who tells me that in 2015 he slipped down the latter traumatizing his left leg. He developed a wound in the same spot the area that we are currently looking at. He states this closed over for the most part although he always felt it was somewhat unstable. In 2016 he hit the same area with the door of his car had this reopened. He tells me that this is never really closed although sometimes an inflow it remains open on a constant basis. He has not been using any specific dressing to this except for topical antibiotics the nature of which were not really sure. His primary doctor did send him to see Dr. [name] of interventional cardiology. He underwent an angiogram on 08/06/17 and he underwent a PTA and directional atherectomy of the lesser distal SFA and popliteal arteries which resulted in brisk improvement in blood flow. It was noted that he had 2 vessel runoff through the anterior tibial and peroneal. He is also been to see vascular and interventional radiologist. He was not felt to have any significant superficial venous insufficiency. Presumably is not a candidate for any ablation. It was suggested he come here for wound care. The patient is a type II diabetic on insulin. He also has a history of venous insufficiency. ABIs on the left were noncompressible in our clinic 10/21/17; patient we admitted to the clinic last week. 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 We put Iodosorb on him after an aggressive debridement and 3 layer compression. He complained of pain in his ankle and itching with is skin in fact he scratched the area on the medial calf superiorly at the rim of our wraps and he has 2 small open areas in that location today which are new. I changed his primary dressing today to silver collagen. As  noted he is already had revascularization and does not have any significant superficial venous insufficiency that would be amenable to ablation 10/28/17; patient admitted to the clinic 2 weeks ago. He has a smaller Wound. Scratch injury from last week revealed. There is large wound over the tibial area. This is smaller. Granulation looks healthy. No need for debridement. 11/04/17; the wound on the left lateral calf looks better. Improved dimensions. Surface of this looks better. We've been maintaining him and Kerlix Coban wraps. He finds this much more comfortable. Silver collagen dressing 11/11/17; left lateral Wound continues to look healthy be making progress. Using a #5 curet I removed removed nonviable skin from the surface of the wound and then necrotic debris from the wound surface. Surface of the wound continues to look healthy. He also has an open area on the left great toenail bed. We've been using topical antibiotics. 11/19/17; left anterior lateral wound continues to look healthy but it's not closed. He also had a small wound above this on the left leg Initially traumatic wounds in the setting of significant chronic venous insufficiency  and stasis dermatitis 11/25/17; left anterior wounds superiorly is closed still a small wound inferiorly. 12/02/17; left anterior tibial area. Arrives today with adherent callus. Post debridement clearly not completely closed. Hydrofera Blue under 3 layer compression. 12/09/17; left anterior tibia. Circumferential eschar however the wound bed looks stable to improved. We've been using Hydrofera Blue under 3 layer compression 12/17/17; left anterior tibia. Apparently this was felt to be closed however when the wrap was taken off there is a skin tear to reopen wounds in the same area we've been using Hydrofera Blue under 3 layer compression 12/23/17 left anterior tibia. Not close to close this week apparently the [facility] was stuck to this again. Still  circumferential eschar requiring debridement. I put a contact layer on this this time under the Hydrofera Blue 12/31/17; left anterior tibia. Wound is better slight amount of hyper-granulation. Using Hydrofera Blue over Adaptic. 01/07/18; left anterior tibia. The wound had some surface eschar however after this was removed he has no open wound.he was already revascularized by Dr. [name] when he came to our clinic with atherectomy of the left SFA and popliteal artery. He was also sent to interventional radiology for venous reflux studies. He was not felt to have significant reflux but certainly has chronic venous changes of his skin with hemosiderin deposition around this area. He will definitely need to lubricate his skin and wear compression stocking and I've talked to him about this. READMISSION 05/26/2018 This is a now 70 year old man we cared for with traumatic wounds on his left anterior lower extremity. He had been previously revascularized during that admission by Dr. [name]. Apparently in follow-up Dr. [name] noted that he had deterioration in his arterial status. He underwent a stent placement in the distal left SFA on 04/22/2018. 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 He went back to the angiography suite on 04/30/2018 he underwent PTA and balloon angioplasty of the occluded left mid anterior tibial artery, thrombotic occlusion went from 100 to 0% which reconstitutes the posterior tibial artery. He had thrombectomy and aspiration of the peroneal artery. The stent placed in the distal SFA left SFA was still occluded. 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 He has been recuperating at [place] home in [facility] after the  most recent hospitalization. He is going home tomorrow. He tells me that 2 weeks ago he traumatized the tip of his left fifth toe. He came in urgently for our review of this. This was a history of before I noted that Dr. [name] had already noted dry gangrenous changes of the left fifth toe 06/09/2018; 2-week follow-up. I did contact Dr. [name] after his last appointment and he apparently saw 1 of Dr. Irven Shelling colleagues the next day. He does not follow-up with Dr. [name] himself until Thursday of this week. 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 Nevertheless there is no evidence of infection no drainage and no pain. He had a new area that this week when we were signing him in today on the left anterior mid tibia area, this is in close proximity to the previous wound we have dealt with in this clinic. 06/23/2018; 2-week follow-up. I did not receive a recent note  from Dr. [name] to review today. Our office is trying to obtain this. He is apparently not planning to do further vascular interventions and wondered about compression to try and help with the patient's chronic venous insufficiency. However we are also concerned about the arterial flow. He arrives in clinic today with a new area on the left third toe. The areas on the calf/anterior tibia are close to closing. The left fifth toe is still mummified using Betadine. -In reviewing things with the patient he has what sounds like claudication with mild to moderate amount of activity. 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 I prescribed Levaquin over the phone while we attempted to arrange a plan of care. However the patient called yesterday to report he had low-grade fever and he came in today acutely. There is been a marked deterioration in the left third toe with spreading cellulitis up into the dorsal left foot. He was referred to the emergency room. Readmission: 06/29/2020 patient presents today  for reevaluation here in our clinic he was previously treated by Dr. Dellia Nims at the latter part of 2019 in 2 the beginning of 2020. Subsequently we have not seen him since that time in the interim he did have evaluation with vein and vascular specialist specifically Dr. Anice Paganini who did perform quite extensive work for a left femoral to anterior tibial artery bypass. With that being said in the interim the patient has developed significant lymphedema and has wounds that he tells me have really never healed in regard to the incision site on the left leg. He also has multiple wounds on the feet for various reasons some of which is that he tends to pick at his feet. Fortunately there is no signs of active infection systemically at this time he does have some wounds that are little bit deeper but most are fairly superficial he seems to have good blood flow and overall everything appears to be healthy I see no bone exposed and no obvious signs of osteomyelitis. I do not know that he necessarily needs a x-ray at this point although that something we could consider depending on how things progress. The patient does have a history of lymphedema, diabetes, this is type II, chronic kidney disease stage III, hypertension, and history of peripheral vascular disease. Electronic Signature(s) Signed: 06/29/2020 3:11:49 PM By: Worthy Keeler PA-C Entered By: Worthy Keeler on 06/29/2020 15:11:49 -------------------------------------------------------------------------------- Physical Exam Details Patient Name: Date of Service: [name], [name] 06/29/2020 1:15 PM Medical Record Number: [id number] Patient Account Number: [id number] Date of Birth/Sex: Treating RN: 11/21/1950 (70 y.o. [name] Primary Care Provider: [name] Other Clinician: Referring Provider: Treating Provider/Extender: Myles Gip in Treatment: 0 Constitutional patient is hypertensive.. pulse regular and  within target range for patient.Marland Kitchen respirations regular, non-labored and within target range for patient.Marland Kitchen temperature within target range for patient.. Well-nourished and well-hydrated in no acute distress. Eyes conjunctiva clear no eyelid edema noted. pupils equal round and reactive to light and accommodation. Ears, Nose, Mouth, and Throat no gross abnormality of ear auricles or external auditory canals. normal hearing noted during conversation. mucus membranes moist. Respiratory normal breathing without difficulty. Cardiovascular Absent posterior tibial and dorsalis pedis pulses bilateral lower extremities. Patient has bilateral lymphedema stage III on the left and stage II on the right. Musculoskeletal normal gait and posture. no significant deformity or arthritic changes, no loss or range of motion, no clubbing. Psychiatric this patient is  able to make decisions and demonstrates good insight into disease process. Alert and Oriented x 3. pleasant and cooperative. Notes On inspection patient's wounds currently did show signs of necrotic tissue noted at multiple locations. With that being said he is going require some sharp debridement in my opinion at multiple locations as well. I discussed that with him today he does want to proceed. I did debride most of the wounds on his feet I did not have to debride anything on the legs they appear to be doing quite well. He did have some hyper granular tissue on the dorsal foot wound just proximal to the great toe and again that was managed quite nicely today. Post debridement all wound areas appear to be doing much better. Electronic Signature(s) Signed: 06/29/2020 3:12:46 PM By: Worthy Keeler PA-C Entered By: Worthy Keeler on 06/29/2020 15:12:45 -------------------------------------------------------------------------------- Physician Orders Details Patient Name: Date of Service: [name]. 06/29/2020 1:15 PM Medical Record Number:  [id number] Patient Account Number: [id number] Date of Birth/Sex: Treating RN: [birth year] (70 y.o. [name] Primary Care Provider: [name] Other Clinician: Referring Provider: Treating Provider/Extender: Myles Gip in Treatment: 0 Verbal / Phone Orders: No Diagnosis Coding ICD-10 Coding Code Description E11.621 Type 2 diabetes mellitus with foot ulcer I89.0 Lymphedema, not elsewhere classified 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 I73.89 Other specified peripheral vascular diseases I10 Essential (primary) hypertension N18.30 Chronic kidney disease, stage 3 unspecified Follow-up Appointments Return Appointment in 1 week. Bathing/ Shower/ Hygiene May shower with protection but do not get wound dressing(s) wet. Edema Control - Lymphedema / SCD / Other Elevate legs to the level of the heart or above for 30 minutes daily and/or when sitting, a frequency of: - throughout the day Avoid standing for long periods of time. Exercise regularly Compression stocking or Garment 20-30 mm/Hg pressure to: - right leg daily Wound Treatment Wound #10 - Lower Leg Wound Laterality: Left, Anterior Cleanser: Soap and Water 1 x Per Week Discharge Instructions: May shower and wash wound with dial antibacterial soap and water prior to dressing change. Peri-Wound Care: Sween Lotion (Moisturizing lotion) 1 x Per Week Discharge Instructions: Apply moisturizing lotion as directed Prim Dressing: Promogran Prisma Matrix, 4.34 (sq in) (silver collagen) 1 x Per Week ary Discharge Instructions: Moisten collagen with saline or hydrogel Secondary Dressing: ABD Pad, 5x9 1 x Per Week Discharge Instructions: Apply over primary dressing as directed. Secondary Dressing: Zetuvit Plus 4x8 in 1 x Per Week Discharge Instructions: Apply over primary  dressing as directed. Compression Wrap: ThreePress (3 layer compression wrap) 1 x Per Week Discharge Instructions: Apply three layer compression as directed. Compression Wrap: Unna boot under wrap to help secure in place 1 x Per Week Wound #11 - Foot Wound Laterality: Dorsal, Left, Medial Cleanser: Soap and Water 1 x Per Week Discharge Instructions: May shower and wash wound with dial antibacterial soap and water prior to dressing change. Peri-Wound Care: Sween Lotion (Moisturizing lotion) 1 x Per Week Discharge Instructions: Apply moisturizing lotion as directed Prim Dressing: Promogran Prisma Matrix, 4.34 (sq in) (silver collagen) 1 x Per Week ary Discharge Instructions: Moisten collagen with saline or hydrogel Secondary Dressing: Woven Gauze Sponge, Non-Sterile 4x4 in 1 x Per Week Discharge Instructions: Apply over primary dressing as directed. Compression Wrap: ThreePress (3 layer compression wrap) 1 x Per Week Discharge  Instructions: Apply three layer compression as directed. Wound #12 - Foot Wound Laterality: Left, Medial, Proximal Cleanser: Soap and Water 1 x Per Week Discharge Instructions: May shower and wash wound with dial antibacterial soap and water prior to dressing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

## 2020-07-05 ENCOUNTER — Other Ambulatory Visit: Payer: Self-pay

## 2020-07-05 ENCOUNTER — Ambulatory Visit (HOSPITAL_COMMUNITY)
Admission: RE | Admit: 2020-07-05 | Discharge: 2020-07-05 | Disposition: A | Discharge status: Home / Self Care | Payer: BC Managed Care – PPO | Source: Ambulatory Visit | Attending: Internal Medicine | Admitting: Internal Medicine

## 2020-07-05 ENCOUNTER — Encounter (HOSPITAL_BASED_OUTPATIENT_CLINIC_OR_DEPARTMENT_OTHER): Payer: BC Managed Care – PPO | Admitting: Internal Medicine

## 2020-07-05 ENCOUNTER — Other Ambulatory Visit (HOSPITAL_COMMUNITY)
Admission: RE | Admit: 2020-07-05 | Discharge: 2020-07-05 | Disposition: A | Discharge status: Home / Self Care | Payer: BC Managed Care – PPO | Attending: Internal Medicine | Admitting: Internal Medicine

## 2020-07-05 ENCOUNTER — Other Ambulatory Visit (HOSPITAL_COMMUNITY): Payer: Self-pay | Admitting: Internal Medicine

## 2020-07-05 DIAGNOSIS — E11621 Type 2 diabetes mellitus with foot ulcer: Secondary | ICD-10-CM | POA: Insufficient documentation

## 2020-07-05 DIAGNOSIS — L97522 Non-pressure chronic ulcer of other part of left foot with fat layer exposed: Secondary | ICD-10-CM | POA: Diagnosis present

## 2020-07-05 NOTE — Progress Notes (Signed)
GREYSON, PEAVY ([id number]) Visit Report for 07/05/2020 Debridement Details Patient Name: Date of Service: CHAITANYA, AMEDEE 07/05/2020 10:15 A M Medical Record Number: [id number] Patient Account Number: [id number] Date of Birth/Sex: Treating RN: 10-Aug-1950 (70 y.o. [name], [name] Primary Care Provider: [name] Other Clinician: Referring Provider: Treating Provider/Extender: Stormy Card in Treatment: 0 Debridement Performed for Assessment: Wound #13 Left,Distal,Medial Foot Performed By: Physician Ricard Dillon., MD Debridement Type: Debridement Severity of Tissue Pre Debridement: Fat layer exposed Level of Consciousness (Pre-procedure): Awake and Alert Pre-procedure Verification/Time Out Yes - 11:48 Taken: Start Time: 11:48 Pain Control: Lidocaine T Area Debrided (L x W): otal 0.9 (cm) x 0.3 (cm) = 0.27 (cm) Tissue and other material debrided: Viable, Non-Viable, Subcutaneous Level: Skin/Subcutaneous Tissue Debridement Description: Excisional Instrument: Blade, Curette Specimen: Tissue Culture Number of Specimens T aken: 1 Bleeding: Minimum Hemostasis Achieved: Pressure End Time: 11:49 Procedural Pain: 0 Post Procedural Pain: 0 Response to Treatment: Procedure was tolerated well Level of Consciousness (Post- Awake and Alert procedure): Post Debridement Measurements of Total Wound Length: (cm) 0.9 Width: (cm) 0.3 Depth: (cm) 0.5 Volume: (cm) 0.106 Character of Wound/Ulcer Post Debridement: Improved Severity of Tissue Post Debridement: Fat layer exposed Post Procedure Diagnosis Same as Pre-procedure Electronic Signature(s) Signed: 07/05/2020 5:07:05 PM By: Linton Ham MD Signed: 07/05/2020 5:35:07 PM By: Rhae Hammock RN Entered By: Linton Ham on 07/05/2020 12:20:59 -------------------------------------------------------------------------------- HPI Details Patient Name: Date of Service: [name] 07/05/2020 10:15  A M Medical Record Number: [id number] Patient Account Number: [id number] Date of Birth/Sex: Treating RN: Nov 09, 1950 (70 y.o. [name] Primary Care Provider: [name] Other Clinician: Referring Provider: Treating Provider/Extender: Stormy Card in Treatment: 0 History of Present Illness HPI Description: 10/11/17; Mr. Corsino is a 70 year old man who tells me that in 2015 he slipped down the latter traumatizing his left leg. He developed a wound in the same spot the area that we are currently looking at. He states this closed over for the most part although he always felt it was somewhat unstable. In 2016 he hit the same area with the door of his car had this reopened. He tells me that this is never really closed although sometimes an inflow it remains open on a constant basis. He has not been using any specific dressing to this except for topical antibiotics the nature of which were not really sure. His primary doctor did send him to see Dr. [name] of interventional cardiology. He underwent an angiogram on 08/06/17 and he underwent a PTA and directional atherectomy of the lesser distal SFA and popliteal arteries which resulted in brisk improvement in blood flow. It was noted that he had 2 vessel runoff through the anterior tibial and peroneal. He is also been to see vascular and interventional radiologist. He was not felt to have any significant superficial venous insufficiency. Presumably is not a candidate for any ablation. It was suggested he come here for wound care. The patient is a type II diabetic on insulin. He also has a history of venous insufficiency. ABIs on the left were noncompressible in our clinic 10/21/17; patient we admitted to the clinic last week. 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 We put Iodosorb on him after an aggressive debridement and 3 layer compression. He complained  of pain in his ankle and itching with is skin in fact he scratched the area on the medial  calf superiorly at the rim of our wraps and he has 2 small open areas in that location today which are new. I changed his primary dressing today to silver collagen. As noted he is already had revascularization and does not have any significant superficial venous insufficiency that would be amenable to ablation 10/28/17; patient admitted to the clinic 2 weeks ago. He has a smaller Wound. Scratch injury from last week revealed. There is large wound over the tibial area. This is smaller. Granulation looks healthy. No need for debridement. 11/04/17; the wound on the left lateral calf looks better. Improved dimensions. Surface of this looks better. We've been maintaining him and Kerlix Coban wraps. He finds this much more comfortable. Silver collagen dressing 11/11/17; left lateral Wound continues to look healthy be making progress. Using a #5 curet I removed removed nonviable skin from the surface of the wound and then necrotic debris from the wound surface. Surface of the wound continues to look healthy. He also has an open area on the left great toenail bed. We've been using topical antibiotics. 11/19/17; left anterior lateral wound continues to look healthy but it's not closed. He also had a small wound above this on the left leg Initially traumatic wounds in the setting of significant chronic venous insufficiency and stasis dermatitis 11/25/17; left anterior wounds superiorly is closed still a small wound inferiorly. 12/02/17; left anterior tibial area. Arrives today with adherent callus. Post debridement clearly not completely closed. Hydrofera Blue under 3 layer compression. 12/09/17; left anterior tibia. Circumferential eschar however the wound bed looks stable to improved. We've been using Hydrofera Blue under 3 layer compression 12/17/17; left anterior tibia. Apparently this was felt to be closed however when the  wrap was taken off there is a skin tear to reopen wounds in the same area we've been using Hydrofera Blue under 3 layer compression 12/23/17 left anterior tibia. Not close to close this week apparently the [facility] was stuck to this again. Still circumferential eschar requiring debridement. I put a contact layer on this this time under the Hydrofera Blue 12/31/17; left anterior tibia. Wound is better slight amount of hyper-granulation. Using Hydrofera Blue over Adaptic. 01/07/18; left anterior tibia. The wound had some surface eschar however after this was removed he has no open wound.he was already revascularized by Dr. [name] when he came to our clinic with atherectomy of the left SFA and popliteal artery. He was also sent to interventional radiology for venous reflux studies. He was not felt to have significant reflux but certainly has chronic venous changes of his skin with hemosiderin deposition around this area. He will definitely need to lubricate his skin and wear compression stocking and I've talked to him about this. READMISSION 05/26/2018 This is a now 70 year old man we cared for with traumatic wounds on his left anterior lower extremity. He had been previously revascularized during that admission by Dr. [name]. Apparently in follow-up Dr. [name] noted that he had deterioration in his arterial status. He underwent a stent placement in the distal left SFA on 04/22/2018. 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 He went back to the angiography suite on 04/30/2018 he underwent PTA and balloon angioplasty of the occluded left mid anterior tibial artery, thrombotic occlusion went from 100 to 0% which reconstitutes the posterior tibial artery. He had thrombectomy and aspiration of the peroneal artery. The stent placed in the distal SFA left SFA was still occluded. He was discharged on Xarelto, it was noted on the discharge summary  from this hospitalization that he had gangrene at  the tip of his left fifth toe and there were expectations this would auto amputate. 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 He has been recuperating at [place] home in [facility] after the most recent hospitalization. He is going home tomorrow. He tells me that 2 weeks ago he traumatized the tip of his left fifth toe. He came in urgently for our review of this. This was a history of before I noted that Dr. [name] had already noted dry gangrenous changes of the left fifth toe 06/09/2018; 2-week follow-up. I did contact Dr. [name] after his last appointment and he apparently saw 1 of Dr. Irven Shelling colleagues the next day. He does not follow-up with Dr. [name] himself until Thursday of this week. 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 Nevertheless there is no evidence of infection no drainage and no pain. He had a new area that this week when we were signing him in today on the left anterior mid tibia area, this is in close proximity to the previous wound we have dealt with in this clinic. 06/23/2018; 2-week follow-up. I did not receive a recent note from Dr. [name] to review today. Our office is trying to obtain this. He is apparently not planning to do further vascular interventions and wondered about compression to try and help with the patient's chronic venous insufficiency. However we are also concerned about the arterial flow. He arrives in clinic today with a new area on the left third toe. The areas on the calf/anterior tibia are close to closing. The left fifth toe is still mummified using Betadine. -In reviewing things with the patient he has what sounds like claudication with mild to moderate amount of activity. 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 I prescribed Levaquin over the phone while we attempted to arrange a plan of care. However the patient called yesterday to report he  had low-grade fever and he came in today acutely. There is been a marked deterioration in the left third toe with spreading cellulitis up into the dorsal left foot. He was referred to the emergency room. Readmission: 06/29/2020 patient presents today for reevaluation here in our clinic he was previously treated by Dr. Dellia Nims at the latter part of 2019 in 2 the beginning of 2020. Subsequently we have not seen him since that time in the interim he did have evaluation with vein and vascular specialist specifically Dr. Anice Paganini who did perform quite extensive work for a left femoral to anterior tibial artery bypass. With that being said in the interim the patient has developed significant lymphedema and has wounds that he tells me have really never healed in regard to the incision site on the left leg. He also has multiple wounds on the feet for various reasons some of which is that he tends to pick at his feet. Fortunately there is no signs of active infection systemically at this time he does have some wounds that are little bit deeper but most are fairly superficial he seems to have good blood flow and overall everything appears to be healthy I see no bone exposed and no obvious signs of osteomyelitis. I do not know that he necessarily needs a x-ray at this point although that something we could consider depending on how things progress. The patient does have a history of  lymphedema, diabetes, this is type II, chronic kidney disease stage III, hypertension, and history of peripheral vascular disease. 07/05/2020; patient admitted last week. Is a patient I remember from 2019 he had a spreading infection involving the left foot and we sent him to the hospital. 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 He subsequently had a left femoral to anterior tibial bypass by Dr.Cain vein and vascular. He also has severe lymphedema with chronic skin changes related to that on  the left leg. The most problematic area that was new today was on the left medial great toe. This was apparently a small area last week there was purulent drainage which our intake nurse cultured. Also areas on the left medial foot and heel left lateral foot. He has 2 areas on the left medial calf left lateral calf in the setting of the severe lymphedema. Electronic Signature(s) Signed: 07/05/2020 5:07:05 PM By: Linton Ham MD Entered By: Linton Ham on 07/05/2020 12:23:34 -------------------------------------------------------------------------------- Physical Exam Details Patient Name: Date of Service: [name] 07/05/2020 10:15 A M Medical Record Number: [id number] Patient Account Number: [id number] Date of Birth/Sex: Treating RN: Oct 04, 1950 (70 y.o. [name] Primary Care Provider: [name] Other Clinician: Referring Provider: Treating Provider/Extender: Stormy Card in Treatment: 0 Constitutional Sitting or standing Blood Pressure is within target range for patient.. Pulse regular and within target range for patient.Marland Kitchen Respirations regular, non-labored and within target range.. Temperature is normal and within the target range for the patient.Marland Kitchen Appears in no distress. Cardiovascular Pulses are palpable. Severe lymphedema in the left leg with severe skin changes.. Notes Wound exam Small wound on the medial left foot had purulent drainage. I used pickups and a #15 scalpel to remove the skin and subcutaneous tissue around this edge which had deteriorated quite a bit from last week. Marked odor. He will need empiric antibiotics. The left plantar first toe and the left medial foot are small wounds about the same. The area on the left lateral foot I think looks about the same. Not really happy about the condition of his forefoot slightly boggy but no overt drainage. On the left upper leg both medial and lateral punched-out areas in the  setting of the chronic lymphedema. These are apparently measuring smaller Electronic Signature(s) Signed: 07/05/2020 5:07:05 PM By: Linton Ham MD Entered By: Linton Ham on 07/05/2020 12:48:22 -------------------------------------------------------------------------------- Physician Orders Details Patient Name: Date of Service: [name] 07/05/2020 10:15 A M Medical Record Number: [id number] Patient Account Number: [id number] Date of Birth/Sex: Treating RN: 12-21-1950 (70 y.o. [name], [name] Primary Care Provider: [name] Other Clinician: Referring Provider: Treating Provider/Extender: Stormy Card in Treatment: 0 Verbal / Phone Orders: No Diagnosis Coding ICD-10 Coding Code Description E11.621 Type 2 diabetes mellitus with foot ulcer I89.0 Lymphedema, not elsewhere classified 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 I73.89 Other specified peripheral vascular diseases I10 Essential (primary) hypertension N18.30 Chronic kidney disease, stage 3 unspecified Follow-up Appointments Return Appointment in 1 week. Bathing/ Shower/ Hygiene May shower with protection but do not get wound dressing(s) wet. Edema Control - Lymphedema / SCD / Other Elevate legs to the level of the heart or above for 30 minutes daily and/or when sitting, a frequency of: - throughout the day Avoid standing for long periods of  time. Exercise regularly Moisturize legs daily. - right leg Compression stocking or Garment 20-30 mm/Hg pressure to: - right leg daily Off-Loading Other: - healing sandal left foot. Wound Treatment Wound #10 - Lower Leg Wound Laterality: Left, Anterior Cleanser: Soap and Water 1 x Per Week Discharge Instructions: May shower and wash wound with dial antibacterial soap and water prior to dressing  change. Peri-Wound Care: Triamcinolone 15 (g) 1 x Per Week Discharge Instructions: Use triamcinolone 15 (g) as directed Peri-Wound Care: Sween Lotion (Moisturizing lotion) 1 x Per Week Discharge Instructions: Apply moisturizing lotion as directed Prim Dressing: Promogran Prisma Matrix, 4.34 (sq in) (silver collagen) 1 x Per Week ary Discharge Instructions: Moisten collagen with saline or hydrogel Secondary Dressing: ABD Pad, 5x9 1 x Per Week Discharge Instructions: Apply over primary dressing as directed. Secondary Dressing: Zetuvit Plus 4x8 in 1 x Per Week Discharge Instructions: Apply over primary dressing as directed. Compression Wrap: FourPress (4 layer compression wrap) 1 x Per Week Discharge Instructions: Apply four layer compression as directed. Compression Wrap: Unna boot under wrap to help secure in place 1 x Per Week Wound #11 - Foot Wound Laterality: Dorsal, Left, Medial Cleanser: Soap and Water 1 x Per Week Discharge Instructions: May shower and wash wound with dial antibacterial soap and water prior to dressing change. Peri-Wound Care: Triamcinolone 15 (g) 1 x Per Week Discharge Instructions: Use triamcinolone 15 (g) as directed Peri-Wound Care: Sween Lotion (Moisturizing lotion) 1 x Per Week Discharge Instructions: Apply moisturizing lotion as directed Prim Dressing: KerraCel Ag Gelling Fiber Dressing, 4x5 in (silver alginate) 1 x Per Week ary Discharge Instructions: Apply silver alginate to wound bed as instructed Secondary Dressing: Woven Gauze Sponge, Non-Sterile 4x4 in 1 x Per Week Discharge Instructions: Apply over primary dressing as directed. Compression Wrap: FourPress (4 layer compression wrap) 1 x Per Week Discharge Instructions: Apply four layer compression as directed. Wound #12 - Foot Wound Laterality: Left, Medial, Proximal Cleanser: Soap and Water 1 x Per Week Discharge Instructions: May shower and wash wound with dial antibacterial soap and water prior to  dressing change. Peri-Wound Care: Triamcinolone 15 (g) 1 x Per Week Discharge Instructions: Use triamcinolone 15 (g) as directed Peri-Wound Care: Sween Lotion (Moisturizing lotion) 1 x Per Week Discharge Instructions: Apply moisturizing lotion as directed Prim Dressing: KerraCel Ag Gelling Fiber Dressing, 4x5 in (silver alginate) 1 x Per Week ary Discharge Instructions: Apply silver alginate to wound bed as instructed Secondary Dressing: Woven Gauze Sponge, Non-Sterile 4x4 in 1 x Per Week Discharge Instructions: Apply over primary dressing as directed. Compression Wrap: FourPress (4 layer compression wrap) 1 x Per Week Discharge Instructions: Apply four layer compression as directed. Wound #13 - Foot Wound Laterality: Left, Medial, Distal Cleanser: Soap and Water 1 x Per Week Discharge Instructions: May shower and wash wound with dial antibacterial soap and water prior to dressing change. Peri-Wound Care: Triamcinolone 15 (g) 1 x Per Week Discharge Instructions: Use triamcinolone 15 (g) as directed Peri-Wound Care: Sween Lotion (Moisturizing lotion) 1 x Per Week Discharge Instructions: Apply moisturizing lotion as directed Prim Dressing: KerraCel Ag Gelling Fiber Dressing, 4x5 in (silver alginate) 1 x Per Week ary Discharge Instructions: Apply silver alginate to wound bed as instructed Secondary Dressing: Woven Gauze Sponge, Non-Sterile 4x4 in 1 x Per Week Discharge Instructions: Apply over primary dressing as directed. Compression Wrap: FourPress (4 layer compression wrap) 1 x Per Week Discharge Instructions: Apply four layer compression as directed. Wound #14 - T Great oe Wound Laterality: Left  Cleanser: Soap and Water 1 x Per Week Discharge Instructions: May shower and wash wound with dial antibacterial soap and water prior to dressing change. Peri-Wound Care: Triamcinolone 15 (g) 1 x Per Week Discharge Instructions: Use triamcinolone 15 (g) as directed Peri-Wound Care: Sween  Lotion (Moisturizing lotion) 1 x Per Week Discharge Instructions: Apply moisturizing lotion as directed Prim Dressing: KerraCel Ag Gelling Fiber Dressing, 4x5 in (silver alginate) 1 x Per Week ary Discharge Instructions: Apply silver alginate to wound bed as instructed Secondary Dressing: Woven Gauze Sponge, Non-Sterile 4x4 in 1 x Per Week Discharge Instructions: Apply over primary dressing as directed. Compression Wrap: FourPress (4 layer compression wrap) 1 x Per Week Discharge Instructions: Apply four layer compression as directed. Wound #15 - Foot Wound Laterality: Left, Lateral Cleanser: Soap and Water 1 x Per Week Discharge Instructions: May shower and wash wound with dial antibacterial soap and water prior to dressing change. Peri-Wound Care: Triamcinolone 15 (g) 1 x Per Week Discharge Instructions: Use triamcinolone 15 (g) as directed Peri-Wound Care: Sween Lotion (Moisturizing lotion) 1 x Per Week Discharge Instructions: Apply moisturizing lotion as directed Prim Dressing: KerraCel Ag Gelling Fiber Dressing, 4x5 in (silver alginate) 1 x Per Week ary Discharge Instructions: Apply silver alginate to wound bed as instructed Secondary Dressing: Woven Gauze Sponge, Non-Sterile 4x4 in 1 x Per Week Discharge Instructions: Apply over primary dressing as directed. Compression Wrap: FourPress (4 layer compression wrap) 1 x Per Week Discharge Instructions: Apply four layer compression as directed. Wound #16 - Metatarsal head fifth Wound Laterality: Left Cleanser: Soap and Water 1 x Per Week Discharge Instructions: May shower and wash wound with dial antibacterial soap and water prior to dressing change. Peri-Wound Care: Triamcinolone 15 (g) 1 x Per Week Discharge Instructions: Use triamcinolone 15 (g) as directed Peri-Wound Care: Sween Lotion (Moisturizing lotion) 1 x Per Week Discharge Instructions: Apply moisturizing lotion as directed Prim Dressing: KerraCel Ag Gelling Fiber Dressing,  4x5 in (silver alginate) 1 x Per Week ary Discharge Instructions: Apply silver alginate to wound bed as instructed Secondary Dressing: Woven Gauze Sponge, Non-Sterile 4x4 in 1 x Per Week Discharge Instructions: Apply over primary dressing as directed. Compression Wrap: FourPress (4 layer compression wrap) 1 x Per Week Discharge Instructions: Apply four layer compression as directed. Wound #9 - Lower Leg Wound Laterality: Left, Medial Cleanser: Soap and Water 1 x Per Week Discharge Instructions: May shower and wash wound with dial antibacterial soap and water prior to dressing change. Peri-Wound Care: Triamcinolone 15 (g) 1 x Per Week Discharge Instructions: Use triamcinolone 15 (g) as directed Peri-Wound Care: Sween Lotion (Moisturizing lotion) 1 x Per Week Discharge Instructions: Apply moisturizing lotion as directed Prim Dressing: Promogran Prisma Matrix, 4.34 (sq in) (silver collagen) 1 x Per Week ary Discharge Instructions: Moisten collagen with saline or hydrogel Secondary Dressing: ABD Pad, 5x9 1 x Per Week Discharge Instructions: Apply over primary dressing as directed. Secondary Dressing: Zetuvit Plus 4x8 in 1 x Per Week Discharge Instructions: Apply over primary dressing as directed. Compression Wrap: FourPress (4 layer compression wrap) 1 x Per Week Discharge Instructions: Apply four layer compression as directed. Compression Wrap: Unna boot under wrap to help secure in place 1 x Per Week Laboratory naerobe culture (MICRO) Bacteria identified in Unspecified specimen by A LOINC Code: 354-6 Convenience Name: Anerobic culture Radiology X-ray, foot - left foot - (ICD10 L97.522 - Non-pressure chronic ulcer of other part of left foot with fat layer exposed) Patient Medications llergies: No Known Drug Allergies A  Notifications Medication Indication Start End wound infection left 07/05/2020 doxycycline monohydrate foot DOSE oral 100 mg capsule - 1 capsule oral bid for 7  days Electronic Signature(s) Signed: 07/05/2020 12:54:11 PM By: Linton Ham MD Entered By: Linton Ham on 07/05/2020 12:54:09 Prescription 07/05/2020 -------------------------------------------------------------------------------- Gerlean Ren MD Patient Name: Provider: [birth year] [id number] Date of Birth: NPI#: [name] [id number] Sex: DEA #: [phone] Phone #: License #: [place] Patient Address: [address], [place] [address] [place], [place] [address] [phone] Allergies No Known Drug Allergies Provider's Orders X-ray, foot - ICD10: Q75.916 - left foot Hand Signature: Date(s): Electronic Signature(s) Signed: 07/05/2020 5:07:05 PM By: Linton Ham MD Entered By: Linton Ham on 07/05/2020 12:54:12 -------------------------------------------------------------------------------- Problem List Details Patient Name: Date of Service: [name] 07/05/2020 10:15 A M Medical Record Number: [id number] Patient Account Number: [id number] Date of Birth/Sex: Treating RN: 01/14/1951 (71 y.o. 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/10/2020 No Yes L97.522 Non-pressure chronic ulcer of other part of left foot with fat layer exposed 06/29/2020 No Yes I73.89 Other specified peripheral vascular diseases 06/29/2020 No Yes I10 Essential (primary) hypertension 06/29/2020 No Yes N18.30 Chronic kidney disease, stage 3 unspecified 06/29/2020 No Yes Inactive  Problems Resolved Problems Electronic Signature(s) Signed: 07/05/2020 5:07:05 PM By: Linton Ham MD Entered By: Linton Ham on 07/05/2020 12:44:55 -------------------------------------------------------------------------------- Progress Note Details Patient Name: Date of Service: [name] 07/05/2020 10:15 A M Medical Record Number: [id number] Patient Account Number: [id number] Date of Birth/Sex: Treating RN: [birth year] (70 y.o. [name] Primary Care Provider: [name] Other Clinician: Referring Provider: Treating Provider/Extender: Stormy Card in Treatment: 0 Subjective History of Present Illness (HPI) 10/11/17; Mr. Hurta is a 70 year old man who tells me that in 2015 he slipped down the latter traumatizing his left leg. He developed a wound in the same spot the area that we are currently looking at. He states this closed over for the most part although he always felt it was somewhat unstable. In 2016 he hit the same area with the door of his car had this reopened. He tells me that this is never really closed although sometimes an inflow it remains open on a constant basis. He has not been using any specific dressing to this except for topical antibiotics the nature of which were not really sure. His primary doctor did send him to see Dr. [name] of interventional cardiology. He underwent an angiogram on 08/06/17 and he underwent a PTA and directional atherectomy of the lesser distal SFA and popliteal arteries which resulted in brisk improvement in blood flow. It was noted that he had 2 vessel runoff through the anterior tibial and peroneal. He is also been to see vascular and interventional radiologist. He was not felt to have any significant superficial venous insufficiency. Presumably is not a candidate for any ablation. It was suggested he come here for wound care. The patient is a type II diabetic on insulin. He also has a history of  venous insufficiency. ABIs on the left were noncompressible in our clinic 10/21/17; patient we  admitted to the clinic last week. 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 We put Iodosorb on him after an aggressive debridement and 3 layer compression. He complained of pain in his ankle and itching with is skin in fact he scratched the area on the medial calf superiorly at the rim of our wraps and he has 2 small open areas in that location today which are new. I changed his primary dressing today to silver collagen. As noted he is already had revascularization and does not have any significant superficial venous insufficiency that would be amenable to ablation 10/28/17; patient admitted to the clinic 2 weeks ago. He has a smaller Wound. Scratch injury from last week revealed. There is large wound over the tibial area. This is smaller. Granulation looks healthy. No need for debridement. 11/04/17; the wound on the left lateral calf looks better. Improved dimensions. Surface of this looks better. We've been maintaining him and Kerlix Coban wraps. He finds this much more comfortable. Silver collagen dressing 11/11/17; left lateral Wound continues to look healthy be making progress. Using a #5 curet I removed removed nonviable skin from the surface of the wound and then necrotic debris from the wound surface. Surface of the wound continues to look healthy. ooHe also has an open area on the left great toenail bed. We've been using topical antibiotics. 11/19/17; left anterior lateral wound continues to look healthy but it's not closed. ooHe also had a small wound above this on the left leg ooInitially traumatic wounds in the setting of significant chronic venous insufficiency and stasis dermatitis 11/25/17; left anterior wounds superiorly is closed still a small wound inferiorly. 12/02/17; left anterior tibial area. Arrives today with adherent  callus. Post debridement clearly not completely closed. Hydrofera Blue under 3 layer compression. 12/09/17; left anterior tibia. Circumferential eschar however the wound bed looks stable to improved. We've been using Hydrofera Blue under 3 layer compression 12/17/17; left anterior tibia. Apparently this was felt to be closed however when the wrap was taken off there is a skin tear to reopen wounds in the same area we've been using Hydrofera Blue under 3 layer compression 12/23/17 left anterior tibia. Not close to close this week apparently the [facility] was stuck to this again. Still circumferential eschar requiring debridement. I put a contact layer on this this time under the Hydrofera Blue 12/31/17; left anterior tibia. Wound is better slight amount of hyper-granulation. Using Hydrofera Blue over Adaptic. 01/07/18; left anterior tibia. The wound had some surface eschar however after this was removed he has no open wound.he was already revascularized by Dr. [name] when he came to our clinic with atherectomy of the left SFA and popliteal artery. He was also sent to interventional radiology for venous reflux studies. He was not felt to have significant reflux but certainly has chronic venous changes of his skin with hemosiderin deposition around this area. He will definitely need to lubricate his skin and wear compression stocking and I've talked to him about this. READMISSION 05/26/2018 This is a now 70 year old man we cared for with traumatic wounds on his left anterior lower extremity. He had been previously revascularized during that admission by Dr. [name]. Apparently in follow-up Dr. [name] noted that he had deterioration in his arterial status. He underwent a stent placement in the distal left SFA on 04/22/2018. 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 He went back to the angiography suite on 04/30/2018 he  underwent PTA and balloon angioplasty of the occluded left mid anterior  tibial artery, thrombotic occlusion went from 100 to 0% which reconstitutes the posterior tibial artery. He had thrombectomy and aspiration of the peroneal artery. The stent placed in the distal SFA left SFA was still occluded. 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 He has been recuperating at [place] [facility] in [facility] after the most recent hospitalization. He is going home tomorrow. He tells me that 2 weeks ago he traumatized the tip of his left fifth toe. He came in urgently for our review of this. This was a history of before I noted that Dr. [name] had already noted dry gangrenous changes of the left fifth toe 06/09/2018; 2-week follow-up. I did contact Dr. [name] after his last appointment and he apparently saw 1 of Dr. Irven Shelling colleagues the next day. He does not follow-up with Dr. [name] himself until Thursday of this week. 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 Nevertheless there is no evidence of infection no drainage and no pain. He had a new area that this week when we were signing him in today on the left anterior mid tibia area, this is in close proximity to the previous wound we have dealt with in this clinic. 06/23/2018; 2-week follow-up. I did not receive a recent note from Dr. [name] to review today. Our office is trying to obtain this. He is apparently not planning to do further vascular interventions and wondered about compression to try and help with the patient's chronic venous insufficiency. However we are also concerned about the arterial flow. ooHe arrives in clinic today with a new area on the left third toe. The areas on the calf/anterior tibia are close to closing. The left fifth toe is still  mummified using Betadine. -In reviewing things with the patient he has what sounds like claudication with mild to moderate amount of activity. 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 I prescribed Levaquin over the phone while we attempted to arrange a plan of care. However the patient called yesterday to report he had low-grade fever and he came in today acutely. There is been a marked deterioration in the left third toe with spreading cellulitis up into the dorsal left foot. He was referred to the emergency room. Readmission: 06/29/2020 patient presents today for reevaluation here in our clinic he was previously treated by Dr. Dellia Nims at the latter part of 2019 in 2 the beginning of 2020. Subsequently we have not seen him since that time in the interim he did have evaluation with vein and vascular specialist specifically Dr. Anice Paganini who did perform quite extensive work for a left femoral to anterior tibial artery bypass. With that being said in the interim the patient has developed significant lymphedema and has wounds that he tells me have really never healed in regard to the incision site on the left leg. He also has multiple wounds on the feet for various reasons some of which is that he tends to pick at his feet. Fortunately there is no signs of active infection systemically at this time he does have some wounds that are little bit deeper but most  are fairly superficial he seems to have good blood flow and overall everything appears to be healthy I see no bone exposed and no obvious signs of osteomyelitis. I do not know that he necessarily needs a x-ray at this point although that something we could consider depending on how things progress. The patient does have a history of lymphedema, diabetes, this is type II, chronic kidney disease stage III, hypertension, and history of peripheral vascular disease. 07/05/2020; patient admitted last week. Is a patient I  remember from 2019 he had a spreading infection involving the left foot and we sent him to the hospital. 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 He subsequently had a left femoral to anterior tibial bypass by Dr.Cain vein and vascular. He also has severe lymphedema with chronic skin changes related to that on the left leg. The most problematic area that was new today was on the left medial great toe. This was apparently a small area last week there was purulent drainage which our intake nurse cultured. Also areas on the left medial foot and heel left lateral foot. He has 2 areas on the left medial calf left lateral calf in the setting of the severe lymphedema. Objective Constitutional Sitting or standing Blood Pressure is within target range for patient.. Pulse regular and within target range for patient.Marland Kitchen Respirations regular, non-labored and within target range.. Temperature is normal and within the target range for the patient.Marland Kitchen Appears in no distress. Vitals Time Taken: 10:39 AM, Height: 73 in, Weight: 245 lbs, BMI: 32.3, Temperature: 98.4 F, Pulse: 73 bpm, Respiratory Rate: 20 breaths/min, Blood Pressure: 133/79 mmHg, Capillary Blood Glucose: 126 mg/dl. Cardiovascular Pulses are palpable. Severe lymphedema in the left leg with severe skin changes.. General Notes: Wound exam oo Small wound on the medial left foot had purulent drainage. I used pickups and a #15 scalpel to remove the skin and subcutaneous tissue around this edge which had deteriorated quite a bit from last week. Marked odor. He will need empiric antibiotics. oo The left plantar first toe and the left medial foot are small wounds about the same. The area on the left lateral foot I think looks about the same. Not really happy about the condition of his forefoot slightly boggy but no overt drainage. oo On the left upper leg both medial and lateral punched-out areas in the setting of the  chronic lymphedema. These are apparently measuring smaller Integumentary (Hair, Skin) Wound #10 status is Open. Original cause of wound was Trauma. The wound is located on the Left,Anterior Lower Leg. The wound measures 1.2cm length x 0.9cm width x 0.3cm depth; 0.848cm^2 area and 0.254cm^3 volume. There is Fat Layer (Subcutaneous Tissue) exposed. There is no tunneling or undermining noted. There is a medium amount of serosanguineous drainage noted. The wound margin is distinct with the outline attached to the wound base. There is large (67-100%) pink granulation within the wound bed. There is a small (1-33%) amount of necrotic tissue within the wound bed including Adherent Slough. Wound #11 status is Open. Original cause of wound was Trauma. The wound is located on the Left,Medial,Dorsal Foot. The wound measures 0.3cm length x 0.6cm width x 0.2cm depth; 0.141cm^2 area and 0.028cm^3 volume. There is Fat Layer (Subcutaneous Tissue) exposed. There is no tunneling or undermining noted. There is a medium amount of serosanguineous drainage noted. The wound margin is distinct with the outline attached to the wound base. There is small (1-33%) pink,  pale granulation within the wound bed. There is a large (67-100%) amount of necrotic tissue within the wound bed including Adherent Slough. Wound #12 status is Open. Original cause of wound was Trauma. The wound is located on the Left,Proximal,Medial Foot. The wound measures 0.4cm length x 0.5cm width x 0.1cm depth; 0.157cm^2 area and 0.016cm^3 volume. There is Fat Layer (Subcutaneous Tissue) exposed. There is no tunneling or undermining noted. There is a medium amount of serosanguineous drainage noted. The wound margin is distinct with the outline attached to the wound base. There is large (67-100%) red granulation within the wound bed. There is no necrotic tissue within the wound bed. Wound #13 status is Open. Original cause of wound was Trauma. The wound is  located on the Left,Distal,Medial Foot. The wound measures 0.9cm length x 0.3cm width x 0.5cm depth; 0.212cm^2 area and 0.106cm^3 volume. There is Fat Layer (Subcutaneous Tissue) exposed. There is no tunneling noted, however, there is undermining starting at 12:00 and ending at 12:00 with a maximum distance of 2.3cm. There is a large amount of purulent drainage noted. Foul odor after cleansing was noted. The wound margin is distinct with the outline attached to the wound base. There is large (67-100%) pink, pale granulation within the wound bed. There is no necrotic tissue within the wound bed. Wound #14 status is Open. Original cause of wound was Trauma. The wound is located on the Left T Great. The wound measures 0.4cm length x 0.3cm width oe x 0.2cm depth; 0.094cm^2 area and 0.019cm^3 volume. There is Fat Layer (Subcutaneous Tissue) exposed. There is no tunneling or undermining noted. There is a medium amount of serosanguineous drainage noted. The wound margin is distinct with the outline attached to the wound base. There is large (67-100%) red granulation within the wound bed. There is no necrotic tissue within the wound bed. Wound #15 status is Open. Original cause of wound was Trauma. The wound is located on the Left,Lateral Foot. The wound measures 0.3cm length x 0.4cm width x 0.2cm depth; 0.094cm^2 area and 0.019cm^3 volume. There is Fat Layer (Subcutaneous Tissue) exposed. There is no tunneling noted. There is a medium amount of serosanguineous drainage noted. The wound margin is distinct with the outline attached to the wound base. There is large (67-100%) pink, pale granulation within the wound bed. There is a small (1-33%) amount of necrotic tissue within the wound bed including Adherent Slough. Wound #16 status is Open. Original cause of wound was Trauma. The wound is located on the Left Metatarsal head fifth. The wound measures 0.1cm length x 0.1cm width x 0.1cm depth; 0.008cm^2 area and  0.001cm^3 volume. There is Fat Layer (Subcutaneous Tissue) exposed. There is no tunneling or undermining noted. There is a none present amount of drainage noted. The wound margin is distinct with the outline attached to the wound base. There is no granulation within the wound bed. There is no necrotic tissue within the wound bed. Wound #9 status is Open. Original cause of wound was Surgical Injury. The wound is located on the Left,Medial Lower Leg. The wound measures 1.7cm length x 0.6cm width x 0.5cm depth; 0.801cm^2 area and 0.401cm^3 volume. There is Fat Layer (Subcutaneous Tissue) exposed. There is no tunneling or undermining noted. There is a medium amount of serosanguineous drainage noted. The wound margin is distinct with the outline attached to the wound base. There is large (67-100%) red, pink granulation within the wound bed. There is a small (1-33%) amount of necrotic tissue within the wound  bed including Adherent Slough. 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 There was a Excisional Skin/Subcutaneous Tissue Debridement with a total area of 0.27 sq cm performed by Ricard Dillon., MD. With the following instrument(s): Blade, and Curette to remove Viable and Non-Viable tissue/material. Material removed includes Subcutaneous Tissue after achieving pain control using Lidocaine. 1 specimen was taken by a Tissue Culture and sent to the  lab per facility protocol. A time out was conducted at 11:48, prior to the start of the procedure. A Minimum amount of bleeding was controlled with Pressure. The procedure was tolerated well with a pain level of 0 throughout and a pain level of 0 following the procedure. Post Debridement Measurements: 0.9cm length x 0.3cm width x 0.5cm depth; 0.106cm^3 volume. Character of Wound/Ulcer Post Debridement is improved. Severity of Tissue Post Debridement is: Fat layer exposed. Post procedure Diagnosis Wound #13: Same as Pre-Procedure Wound #10 Pre-procedure diagnosis of Wound #10 is a Lymphedema located on the Left,Anterior Lower Leg . There was a Four Layer Compression Therapy Procedure by Rhae Hammock, RN. Post procedure Diagnosis Wound #10: Same as Pre-Procedure Plan Follow-up Appointments: Return Appointment in 1 week. Bathing/ Shower/ Hygiene: May shower with protection but do not get wound dressing(s) wet. Edema Control - Lymphedema / SCD / Other: Elevate legs to the level of the heart or above for 30 minutes daily and/or when sitting, a frequency of: - throughout the day Avoid standing for long periods of time. Exercise regularly Moisturize legs daily. - right leg Compression stocking or Garment 20-30 mm/Hg pressure to: - right leg daily Off-Loading: Other: - healing sandal left foot. Laboratory ordered were: Anerobic culture Radiology ordered were: X-ray, foot - left foot The following medication(s) was prescribed: doxycycline monohydrate oral 100 mg capsule 1 capsule oral bid for 7 days for wound infection left foot starting 07/05/2020 WOUND #10: - Lower Leg Wound Laterality: Left, Anterior Cleanser: Soap and Water 1 x Per Week/ Discharge Instructions: May shower and wash wound with dial antibacterial soap and water prior to dressing change. Peri-Wound Care: Triamcinolone 15 (g) 1 x Per Week/ Discharge Instructions: Use triamcinolone 15 (g) as directed Peri-Wound Care: Sween  Lotion (Moisturizing lotion) 1 x Per Week/ Discharge Instructions: Apply moisturizing lotion as directed Prim Dressing: Promogran Prisma Matrix, 4.34 (sq in) (silver collagen) 1 x Per Week/ ary Discharge Instructions: Moisten collagen with saline or hydrogel Secondary Dressing: ABD Pad, 5x9 1 x Per Week/ Discharge Instructions: Apply over primary dressing as directed. Secondary Dressing: Zetuvit Plus 4x8 in 1 x Per Week/ Discharge Instructions: Apply over primary dressing as directed. Com pression Wrap: FourPress (4 layer compression wrap) 1 x Per Week/ Discharge Instructions: Apply four layer compression as directed. Com pression Wrap: Unna boot under wrap to help secure in place 1 x Per Week/ WOUND #11: - Foot Wound Laterality: Dorsal, Left, Medial Cleanser: Soap and Water 1 x Per Week/ Discharge Instructions: May shower and wash wound with dial antibacterial soap and water prior to dressing change. Peri-Wound Care: Triamcinolone 15 (g) 1 x  Per Week/ Discharge Instructions: Use triamcinolone 15 (g) as directed Peri-Wound Care: Sween Lotion (Moisturizing lotion) 1 x Per Week/ Discharge Instructions: Apply moisturizing lotion as directed Prim Dressing: KerraCel Ag Gelling Fiber Dressing, 4x5 in (silver alginate) 1 x Per Week/ ary Discharge Instructions: Apply silver alginate to wound bed as instructed Secondary Dressing: Woven Gauze Sponge, Non-Sterile 4x4 in 1 x Per Week/ Discharge Instructions: Apply over primary dressing as directed. Com pression Wrap: FourPress (4 layer compression wrap) 1 x Per Week/ Discharge Instructions: Apply four layer compression as directed. WOUND #12: - Foot Wound Laterality: Left, Medial, Proximal Cleanser: Soap and Water 1 x Per Week/ Discharge Instructions: May shower and wash wound with dial antibacterial soap and water prior to dressing change. Peri-Wound Care: Triamcinolone 15 (g) 1 x Per Week/ Discharge Instructions: Use triamcinolone 15 (g) as  directed Peri-Wound Care: Sween Lotion (Moisturizing lotion) 1 x Per Week/ Discharge Instructions: Apply moisturizing lotion as directed Prim Dressing: KerraCel Ag Gelling Fiber Dressing, 4x5 in (silver alginate) 1 x Per Week/ ary Discharge Instructions: Apply silver alginate to wound bed as instructed Secondary Dressing: Woven Gauze Sponge, Non-Sterile 4x4 in 1 x Per Week/ Discharge Instructions: Apply over primary dressing as directed. Com pression Wrap: FourPress (4 layer compression wrap) 1 x Per Week/ Discharge Instructions: Apply four layer compression as directed. WOUND #13: - Foot Wound Laterality: Left, Medial, Distal Cleanser: Soap and Water 1 x Per Week/ Discharge Instructions: May shower and wash wound with dial antibacterial soap and water prior to dressing change. Peri-Wound Care: Triamcinolone 15 (g) 1 x Per Week/ Discharge Instructions: Use triamcinolone 15 (g) as directed Peri-Wound Care: Sween Lotion (Moisturizing lotion) 1 x Per Week/ Discharge Instructions: Apply moisturizing lotion as directed Prim Dressing: KerraCel Ag Gelling Fiber Dressing, 4x5 in (silver alginate) 1 x Per Week/ ary Discharge Instructions: Apply silver alginate to wound bed as instructed Secondary Dressing: Woven Gauze Sponge, Non-Sterile 4x4 in 1 x Per Week/ Discharge Instructions: Apply over primary dressing as directed. Com pression Wrap: FourPress (4 layer compression wrap) 1 x Per Week/ Discharge Instructions: Apply four layer compression as directed. WOUND #14: - T Great Wound Laterality: Left oe Cleanser: Soap and Water 1 x Per Week/ Discharge Instructions: May shower and wash wound with dial antibacterial soap and water prior to dressing change. Peri-Wound Care: Triamcinolone 15 (g) 1 x Per Week/ Discharge Instructions: Use triamcinolone 15 (g) as directed Peri-Wound Care: Sween Lotion (Moisturizing lotion) 1 x Per Week/ Discharge Instructions: Apply moisturizing lotion as directed Prim  Dressing: KerraCel Ag Gelling Fiber Dressing, 4x5 in (silver alginate) 1 x Per Week/ ary Discharge Instructions: Apply silver alginate to wound bed as instructed Secondary Dressing: Woven Gauze Sponge, Non-Sterile 4x4 in 1 x Per Week/ Discharge Instructions: Apply over primary dressing as directed. Com pression Wrap: FourPress (4 layer compression wrap) 1 x Per Week/ Discharge Instructions: Apply four layer compression as directed. WOUND #15: - Foot Wound Laterality: Left, Lateral Cleanser: Soap and Water 1 x Per Week/ Discharge Instructions: May shower and wash wound with dial antibacterial soap and water prior to dressing change. Peri-Wound Care: Triamcinolone 15 (g) 1 x Per Week/ Discharge Instructions: Use triamcinolone 15 (g) as directed Peri-Wound Care: Sween Lotion (Moisturizing lotion) 1 x Per Week/ Discharge Instructions: Apply moisturizing lotion as directed Prim Dressing: KerraCel Ag Gelling Fiber Dressing, 4x5 in (silver alginate) 1 x Per Week/ ary Discharge Instructions: Apply silver alginate to wound bed as instructed Secondary Dressing: Woven Gauze Sponge, Non-Sterile 4x4 in 1 x  Per Week/ Discharge Instructions: Apply over primary dressing as directed. Com pression Wrap: FourPress (4 layer compression wrap) 1 x Per Week/ Discharge Instructions: Apply four layer compression as directed. WOUND #16: - Metatarsal head fifth Wound Laterality: Left Cleanser: Soap and Water 1 x Per Week/ Discharge Instructions: May shower and wash wound with dial antibacterial soap and water prior to dressing change. Peri-Wound Care: Triamcinolone 15 (g) 1 x Per Week/ Discharge Instructions: Use triamcinolone 15 (g) as directed Peri-Wound Care: Sween Lotion (Moisturizing lotion) 1 x Per Week/ Discharge Instructions: Apply moisturizing lotion as directed Prim Dressing: KerraCel Ag Gelling Fiber Dressing, 4x5 in (silver alginate) 1 x Per Week/ ary Discharge Instructions: Apply silver alginate to  wound bed as instructed Secondary Dressing: Woven Gauze Sponge, Non-Sterile 4x4 in 1 x Per Week/ Discharge Instructions: Apply over primary dressing as directed. Com pression Wrap: FourPress (4 layer compression wrap) 1 x Per Week/ Discharge Instructions: Apply four layer compression as directed. WOUND #9: - Lower Leg Wound Laterality: Left, Medial Cleanser: Soap and Water 1 x Per Week/ Discharge Instructions: May shower and wash wound with dial antibacterial soap and water prior to dressing change. Peri-Wound Care: Triamcinolone 15 (g) 1 x Per Week/ Discharge Instructions: Use triamcinolone 15 (g) as directed Peri-Wound Care: Sween Lotion (Moisturizing lotion) 1 x Per Week/ Discharge Instructions: Apply moisturizing lotion as directed Prim Dressing: Promogran Prisma Matrix, 4.34 (sq in) (silver collagen) 1 x Per Week/ ary Discharge Instructions: Moisten collagen with saline or hydrogel Secondary Dressing: ABD Pad, 5x9 1 x Per Week/ Discharge Instructions: Apply over primary dressing as directed. Secondary Dressing: Zetuvit Plus 4x8 in 1 x Per Week/ Discharge Instructions: Apply over primary dressing as directed. Com pression Wrap: FourPress (4 layer compression wrap) 1 x Per Week/ Discharge Instructions: Apply four layer compression as directed. Com pression Wrap: Unna boot under wrap to help secure in place 1 x Per Week/ 1. We put silver collagen on the areas on his legs and silver alginate on the areas on his feet 2. The left medial first metatarsal head has a CNand pending but I am going to start him on oral doxycycline empirically 3. I am going to x-ray the left foot 4. I do not like the condition of his left foot in general. I wonder if he is going to require an MRI Electronic Signature(s) Signed: 07/05/2020 12:54:40 PM By: Linton Ham MD Entered By: Linton Ham on 07/05/2020 12:54:39 -------------------------------------------------------------------------------- SuperBill  Details Patient Name: Date of Service: [name] 07/05/2020 Medical Record Number: [id number] Patient Account Number: [id number] Date of Birth/Sex: Treating RN: 1950/10/08 (69 y.o. 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 Other specified peripheral vascular diseases I10 Essential (primary) hypertension N18.30 Chronic kidney disease, stage 3 unspecified Facility Procedures CPT4 Code: [id number] Description: 94496 - DEB SUBQ TISSUE 20 SQ CM/< ICD-10 Diagnosis Description L97.522 Non-pressure chronic ulcer of other part of left foot with fat layer exposed Modifier: Quantity: 1 Physician Procedures : CPT4 Code Description Modifier [id number] - WC PHYS SUBQ TISS 20 SQ CM ICD-10 Diagnosis Description L97.522 Non-pressure chronic ulcer of other part of left foot with  fat layer exposed Quantity: 1 Electronic Signature(s) Signed: 07/05/2020 5:07:05 PM By: Linton Ham MD Entered By: Linton Ham on 07/05/2020 12:54:53

## 2020-07-06 ENCOUNTER — Encounter (HOSPITAL_BASED_OUTPATIENT_CLINIC_OR_DEPARTMENT_OTHER): Payer: BC Managed Care – PPO | Admitting: Physician Assistant

## 2020-07-07 NOTE — Progress Notes (Signed)
HADES, [name] ([id number]) Visit Report for 07/05/2020 Arrival Information Details Patient Name: Date of Service: [name], [name] 07/05/2020 10:15 A M Medical Record Number: [id number] Patient Account Number: [id number] Date of Birth/Sex: Treating RN: 18-Aug-1950 (70 y.o. [name], [name] Primary Care Hassaan [name]: [name] Other Clinician: Referring Sakiya Stepka: Treating Ashleyanne Hemmingway/Extender: Stormy Card in Treatment: 0 Visit Information History Since Last Visit Added or deleted any medications: No Patient Arrived: Ambulatory Any new allergies or adverse reactions: No Arrival Time: 10:39 Had a fall or experienced change in No Accompanied By: self activities of daily living that may affect Transfer Assistance: None risk of falls: Patient Identification Verified: Yes Signs or symptoms of abuse/neglect since last visito No Secondary Verification Process Completed: Yes Hospitalized since last visit: No Patient Requires Transmission-Based Precautions: No Implantable device outside of the clinic excluding No Patient Has Alerts: Yes cellular tissue based products placed in the center Patient Alerts: Patient on Blood Thinner since last visit: 01/29/20 LABI 1.17 TBI 0.88 Has Dressing in Place as Prescribed: Yes 01/29/20 R 1.23 Pain Present Now: No Electronic Signature(s) Signed: 07/05/2020 12:53:26 PM By: Sandre Kitty Entered By: Sandre Kitty on 07/05/2020 10:39:45 -------------------------------------------------------------------------------- Compression Therapy Details Patient Name: Date of Service: [name]. 07/05/2020 10:15 A M Medical Record Number: [id number] Patient Account Number: [id number] Date of Birth/Sex: Treating RN: [birth year] (69 y.o. [name], [name] Primary Care Stefan Karen: [name] Other Clinician: Referring Otha Rickles: Treating Mahogany Torrance/Extender: Stormy Card in Treatment: 0 Compression Therapy Performed  for Wound Assessment: Wound #10 Left,Anterior Lower Leg Performed By: Clinician Rhae Hammock, RN Compression Type: Four Layer Post Procedure Diagnosis Same as Pre-procedure Electronic Signature(s) Signed: 07/05/2020 5:35:07 PM By: Rhae Hammock RN Entered By: Rhae Hammock on 07/05/2020 11:50:03 -------------------------------------------------------------------------------- Encounter Discharge Information Details Patient Name: Date of Service: [name]. 07/05/2020 10:15 A M Medical Record Number: [id number] Patient Account Number: [id number] Date of Birth/Sex: Treating RN: 1950-10-27 (70 y.o. [name] Primary Care Consuela Widener: [name] Other Clinician: Referring Theresa Wedel: Treating Marybell Robards/Extender: Stormy Card in Treatment: 0 Encounter Discharge Information Items Post Procedure Vitals Discharge Condition: Stable Temperature (F): 98.4 Ambulatory Status: Ambulatory Pulse (bpm): 73 Discharge Destination: Home Respiratory Rate (breaths/min): 20 Transportation: Private Auto Blood Pressure (mmHg): 133/79 Accompanied By: alone Schedule Follow-up Appointment: Yes Clinical Summary of Care: Patient Declined Electronic Signature(s) Signed: 07/07/2020 5:28:50 PM By: Levan Hurst RN, BSN Entered By: Levan Hurst on 07/05/2020 13:31:13 -------------------------------------------------------------------------------- Lower Extremity Assessment Details Patient Name: Date of Service: [name]. 07/05/2020 10:15 A M Medical Record Number: [id number] Patient Account Number: [id number] Date of Birth/Sex: Treating RN: [birth year] (70 y.o. [name] Primary Care Mickeal Daws: [name] Other Clinician: Referring Chaunda Vandergriff: Treating Fortino Haag/Extender: Stormy Card in Treatment: 0 Edema Assessment Assessed: [name]: Yes] [name]: No] Edema: [Left: Ye] [Right: s] Calf Left: Right: Point of Measurement: 39  cm From Medial Instep 44 cm Ankle Left: Right: Point of Measurement: 12 cm From Medial Instep 28.5 cm Electronic Signature(s) Signed: 07/05/2020 2:51:26 PM By: Deon Pilling Entered By: Deon Pilling on 07/05/2020 11:00:38 -------------------------------------------------------------------------------- Multi Wound Chart Details Patient Name: Date of Service: [name]. 07/05/2020 10:15 A M Medical Record Number: [id number] Patient Account Number: [id number] Date of Birth/Sex: Treating RN: 26-Jun-1950 (70 y.o. [name] Primary Care Keeton Kassebaum: [name] Other Clinician: Referring Ysabela Keisler: Treating Chalene Treu/Extender: Stormy Card in Treatment: 0 Vital Signs Height(in): 73 Capillary Blood Glucose(mg/dl): 126 Weight(lbs): 245  Pulse(bpm): 26 Body Mass Index(BMI): 32 Blood Pressure(mmHg): 133/79 Temperature(F): 98.4 Respiratory Rate(breaths/min): 20 Photos: [10:No Photos Left, Anterior Lower Leg] [11:No Photos Left, Medial, Dorsal Foot] [12:No Photos Left, Proximal, Medial Foot] Wound Location: [10:Trauma] [11:Trauma] [12:Trauma] Wounding Event: [10:Lymphedema] [11:Diabetic Wound/Ulcer of the Lower] [12:Diabetic Wound/Ulcer of the Lower] Primary Etiology: [10:Diabetic Wound/Ulcer of the Lower] [11:Extremity Lymphedema] [12:Extremity N/A] Secondary Etiology: [10:Extremity Glaucoma, Sleep Apnea,] [11:Glaucoma, Sleep Apnea,] [12:Glaucoma, Sleep Apnea,] Comorbid History: [10:Hypertension, Peripheral Arterial Disease, Peripheral Venous Disease, Type II Diabetes, Gout, Osteoarthritis, Neuropathy 10/23/2019] [11:Hypertension, Peripheral Arterial Disease, Peripheral Venous Disease, Type II Diabetes, Gout,  Osteoarthritis, Neuropathy 05/25/2020] [12:Hypertension, Peripheral Arterial Disease, Peripheral Venous Disease, Type II Diabetes, Gout, Osteoarthritis, Neuropathy 05/25/2020] Date Acquired: [10:0] [11:0] [12:0] Weeks of Treatment: [10:Open] [11:Open]  [12:Open] Wound Status: [10:1.2x0.9x0.3] [11:0.3x0.6x0.2] [12:0.4x0.5x0.1] Measurements L x W x D (cm) [10:0.848] [11:0.141] [12:0.157] A (cm) : rea [10:0.254] [11:0.028] [12:0.016] Volume (cm) : [10:-12.50%] [11:77.50%] [12:63.00%] % Reduction in Area: [10:-238.70%] [11:55.60%] [12:81.20%] % Reduction in Volume: [10:No] [11:No] [12:No] Undermining: [10:Full Thickness Without Exposed] [11:Grade 2] [12:Grade 2] Classification: [10:Support Structures Medium] [11:Medium] [12:Medium] Exudate Amount: [10:Serosanguineous] [11:Serosanguineous] [12:Serosanguineous] Exudate Type: [10:red, brown] [11:red, brown] [12:red, brown] Exudate Color: [10:No] [11:No] [12:No] Foul Odor A Cleansing: [10:fter N/A] [11:N/A] [12:N/A] Odor Anticipated Due to Product Use: [10:Distinct, outline attached] [11:Distinct, outline attached] [12:Distinct, outline attached] Wound Margin: [10:Large (67-100%)] [11:Small (1-33%)] [12:Large (67-100%)] Granulation A mount: [10:Pink] [11:Pink, Pale] [12:Red] Granulation Quality: [10:Small (1-33%)] [11:Large (67-100%)] [12:None Present (0%)] Necrotic Amount: [10:Fat Layer (Subcutaneous Tissue): Yes Fat Layer (Subcutaneous Tissue): Yes Fat Layer (Subcutaneous Tissue): Yes] Exposed Structures: [10:Fascia: No Tendon: No Muscle: No Joint: No Bone: No Small (1-33%)] [11:Fascia: No Tendon: No Muscle: No Joint: No Bone: No Medium (34-66%)] [12:Fascia: No Tendon: No Muscle: No Joint: No Bone: No Medium (34-66%)] Epithelialization: [10:N/A] [11:N/A] [12:N/A] Debridement: [10:N/A] [11:N/A] [12:N/A] Pain Control: [10:N/A] [11:N/A] [12:N/A] Tissue Debrided: [10:N/A] [11:N/A] [12:N/A] Level: [10:N/A] [11:N/A] [12:N/A] Debridement A (sq cm): [10:rea N/A] [11:N/A] [12:N/A] Instrument: [10:N/A] [11:N/A] [12:N/A] Bleeding: [10:N/A] [11:N/A] [12:N/A] Hemostasis A chieved: [10:N/A] [11:N/A] [12:N/A] Procedural Pain: [10:N/A] [11:N/A] [12:N/A] Post Procedural Pain: Debridement Treatment  Response: N/A [11:N/A] [12:N/A] Post Debridement Measurements L x N/A [11:N/A] [12:N/A] W x D (cm) [10:N/A] [11:N/A] [12:N/A] Post Debridement Volume: (cm) [10:Compression Therapy] [11:N/A] [12:N/A] Wound Number: [id number]  Photos: No Photos No Photos No Photos Left, Distal, Medial Foot Left T Great oe Left, Lateral Foot Wound Location: Trauma Trauma Trauma Wounding Event: Diabetic Wound/Ulcer of the Lower Diabetic Wound/Ulcer of the Lower Diabetic Wound/Ulcer of the Lower Primary Etiology: Extremity Extremity Extremity N/A N/A Lymphedema Secondary Etiology: Glaucoma, Sleep Apnea, Glaucoma, Sleep Apnea, Glaucoma, Sleep Apnea, Comorbid History: Hypertension, Peripheral Arterial Hypertension, Peripheral Arterial Hypertension, Peripheral Arterial Disease, Peripheral Venous Disease, Disease, Peripheral Venous Disease, Disease, Peripheral Venous Disease, Type II Diabetes, Gout, Osteoarthritis, Type II Diabetes, Gout, Osteoarthritis, Type II Diabetes, Gout, Osteoarthritis, Neuropathy Neuropathy Neuropathy 05/25/2020 05/25/2020 05/25/2020 Date Acquired: 0 0 0 Weeks of Treatment: Open Open Open Wound Status: 0.9x0.3x0.5 0.4x0.3x0.2 0.3x0.4x0.2 Measurements L x W x D (cm) 0.212 0.094 0.094 A (cm) : rea 0.106 0.019 0.019 Volume (cm) : 10.20% 20.30% 85.00% % Reduction in A rea: -341.70% -58.30% 84.90% % Reduction in Volume: 12 Starting Position 1 (o'clock): 12 Ending Position 1 (o'clock): 2.3 Maximum Distance 1 (cm): Yes No N/A Undermining: Grade 2 Grade 2 Grade 2 Classification: Large Medium Medium Exudate A mount: Purulent Serosanguineous Serosanguineous Exudate Type: yellow, brown, green red, brown red, brown Exudate Color: Yes No No Foul Odor  A Cleansing: fter No N/A N/A Odor A nticipated Due to Product Use: Distinct, outline attached Distinct, outline attached Distinct, outline attached Wound Margin: Large (67-100%) Large (67-100%) Large (67-100%) Granulation A  mount: Pink, Pale Red Pink, Pale Granulation Quality: None Present (0%) None Present (0%) Small (1-33%) Necrotic A mount: Fat Layer (Subcutaneous Tissue): Yes Fat Layer (Subcutaneous Tissue): Yes Fat Layer (Subcutaneous Tissue): Yes Exposed Structures: Fascia: No Fascia: No Fascia: No Tendon: No Tendon: No Tendon: No Muscle: No Muscle: No Muscle: No Joint: No Joint: No Joint: No Bone: No Bone: No Bone: No None None None Epithelialization: Debridement - Excisional N/A N/A Debridement: Pre-procedure Verification/Time Out 11:48 N/A N/A Taken: Lidocaine N/A N/A Pain Control: Subcutaneous N/A N/A Tissue Debrided: Skin/Subcutaneous Tissue N/A N/A Level: 0.27 N/A N/A Debridement A (sq cm): rea Blade, Curette N/A N/A Instrument: Minimum N/A N/A Bleeding: Pressure N/A N/A Hemostasis A chieved: 0 N/A N/A Procedural Pain: 0 N/A N/A Post Procedural Pain: Procedure was tolerated well N/A N/A Debridement Treatment Response: 0.9x0.3x0.5 N/A N/A Post Debridement Measurements L x W x D (cm) 0.106 N/A N/A Post Debridement Volume: (cm) Debridement N/A N/A Procedures Performed: Wound Number: [id number] N/A Photos: No Photos No Photos N/A Left Metatarsal head fifth Left, Medial Lower Leg N/A Wound Location: Trauma Surgical Injury N/A Wounding Event: Diabetic Wound/Ulcer of the Lower Open Surgical Wound N/A Primary Etiology: Extremity N/A Lymphedema N/A Secondary Etiology: Glaucoma, Sleep Apnea, Glaucoma, Sleep Apnea, N/A Comorbid History: Hypertension, Peripheral Arterial Hypertension, Peripheral Arterial Disease, Peripheral Venous Disease, Disease, Peripheral Venous Disease, Type II Diabetes, Gout, Osteoarthritis, Type II Diabetes, Gout, Osteoarthritis, Neuropathy Neuropathy 05/25/2020 10/23/2019 N/A Date Acquired: 0 0 N/A Weeks of Treatment: Open Open N/A Wound Status: 0.1x0.1x0.1 1.7x0.6x0.5 N/A Measurements L x W x D (cm) 0.008 0.801 N/A A (cm) : rea 0.001  0.401 N/A Volume (cm) : 95.70% 49.00% N/A % Reduction in Area: 94.70% 36.10% N/A % Reduction in Volume: No No N/A Undermining: Grade 2 Full Thickness Without Exposed N/A Classification: Support Structures None Present Medium N/A Exudate Amount: N/A Serosanguineous N/A Exudate Type: N/A red, brown N/A Exudate Color: No No N/A Foul Odor A Cleansing: fter N/A N/A N/A Odor Anticipated Due to Product Use: Distinct, outline attached Distinct, outline attached N/A Wound Margin: None Present (0%) Large (67-100%) N/A Granulation A mount: N/A Red, Pink N/A Granulation Quality: None Present (0%) Small (1-33%) N/A Necrotic Amount: Fat Layer (Subcutaneous Tissue): Yes Fat Layer (Subcutaneous Tissue): Yes N/A Exposed Structures: Fascia: No Fascia: No Tendon: No Tendon: No Muscle: No Muscle: No Joint: No Joint: No Bone: No Bone: No Large (67-100%) Small (1-33%) N/A Epithelialization: N/A N/A N/A Debridement: N/A N/A N/A Pain Control: N/A N/A N/A Tissue Debrided: N/A N/A N/A Level: N/A N/A N/A Debridement A (sq cm): rea N/A N/A N/A Instrument: N/A N/A N/A Bleeding: N/A N/A N/A Hemostasis Achieved: N/A N/A N/A Procedural Pain: N/A N/A N/A Post Procedural Pain: N/A N/A N/A Debridement Treatment Response: N/A N/A N/A Post Debridement Measurements L x W x D (cm) N/A N/A N/A Post Debridement Volume: (cm) N/A N/A N/A Procedures Performed: Treatment Notes Electronic Signature(s) Signed: 07/05/2020 5:07:05 PM By: Linton Ham MD Signed: 07/05/2020 5:35:07 PM By: Rhae Hammock RN Entered By: Linton Ham on 07/05/2020 12:45:09 -------------------------------------------------------------------------------- Multi-Disciplinary Care Plan Details Patient Name: Date of Service: [name]. 07/05/2020 10:15 A M Medical Record Number: [id number] Patient Account Number: [id number] Date of Birth/Sex: Treating RN: 1950-08-23 (70 y.o. [name],  [name] Primary Care Joash Tony: [name],  Carloyn Manner Other Clinician: Referring Tamim Skog: Treating Benjerman Molinelli/Extender: Stormy Card in Treatment: 0 Active Inactive Nutrition Nursing Diagnoses: Impaired glucose control: actual or potential Potential for alteratiion in Nutrition/Potential for imbalanced nutrition Goals: Patient/caregiver agrees to and verbalizes understanding of need to use nutritional supplements and/or vitamins as prescribed Date Initiated: 06/29/2020 Target Resolution Date: 07/29/2020 Goal Status: Active Patient/caregiver will maintain therapeutic glucose control Date Initiated: 06/29/2020 Target Resolution Date: 07/29/2020 Goal Status: Active Interventions: Assess HgA1c results as ordered upon admission and as needed Assess patient nutrition upon admission and as needed per policy Provide education on elevated blood sugars and impact on wound healing Provide education on nutrition Treatment Activities: Education provided on Nutrition : 06/29/2020 Notes: Venous Leg Ulcer Nursing Diagnoses: Actual venous Insuffiency (use after diagnosis is confirmed) Knowledge deficit related to disease process and management Goals: Patient will maintain optimal edema control Date Initiated: 06/29/2020 Target Resolution Date: 07/29/2020 Goal Status: Active Patient/caregiver will verbalize understanding of disease process and disease management Date Initiated: 06/29/2020 Target Resolution Date: 07/29/2020 Goal Status: Active Interventions: Assess peripheral edema status every visit. Compression as ordered Provide education on venous insufficiency Notes: Wound/Skin Impairment Nursing Diagnoses: Impaired tissue integrity Knowledge deficit related to ulceration/compromised skin integrity Goals: Patient/caregiver will verbalize understanding of skin care regimen Date Initiated: 06/29/2020 Target Resolution Date: 07/29/2020 Goal Status: Active Interventions: Assess  patient/caregiver ability to obtain necessary supplies Assess patient/caregiver ability to perform ulcer/skin care regimen upon admission and as needed Assess ulceration(s) every visit Provide education on ulcer and skin care Notes: Electronic Signature(s) Signed: 07/05/2020 5:35:07 PM By: Rhae Hammock RN Entered By: Rhae Hammock on 07/05/2020 10:45:15 -------------------------------------------------------------------------------- Pain Assessment Details Patient Name: Date of Service: [name]. 07/05/2020 10:15 A M Medical Record Number: [id number] Patient Account Number: [id number] Date of Birth/Sex: Treating RN: [birth year] (70 y.o. [name], [name] Primary Care Gurleen Larrivee: [name] Other Clinician: Referring Reyden Smith: Treating Leontina Skidmore/Extender: Stormy Card in Treatment: 0 Active Problems Location of Pain Severity and Description of Pain Patient Has Paino No Site Locations Pain Management and Medication Current Pain Management: Electronic Signature(s) Signed: 07/05/2020 12:53:26 PM By: Sandre Kitty Signed: 07/05/2020 5:35:07 PM By: Rhae Hammock RN Entered By: Sandre Kitty on 07/05/2020 10:40:33 -------------------------------------------------------------------------------- Patient/Caregiver Education Details Patient Name: Date of Service: [name] 1/11/2022andnbsp10:15 [place] Record Number: [id number] Patient Account Number: [id number] Date of Birth/Gender: Treating RN: [birth year] (70 y.o. [name] Primary Care Physician: [name] Other Clinician: Referring Physician: Treating Physician/Extender: Stormy Card in Treatment: 0 Education Assessment Education Provided To: Patient Education Topics Provided Wound/Skin Impairment: Methods: Explain/Verbal Responses: State content correctly Electronic Signature(s) Signed: 07/05/2020 5:35:07 PM By: Rhae Hammock  RN Entered By: Rhae Hammock on 07/05/2020 10:57:47 -------------------------------------------------------------------------------- Wound Assessment Details Patient Name: Date of Service: [name]. 07/05/2020 10:15 A M Medical Record Number: [id number] Patient Account Number: [id number] Date of Birth/Sex: Treating RN: 06/25/1951 (70 y.o. [name] Primary Care Arkeem Harts: [name] Other Clinician: Referring Hollee Fate: Treating Leelynn Whetsel/Extender: Stormy Card in Treatment: 0 Wound Status Wound Number: 10 Primary Lymphedema Etiology: Wound Location: Left, Anterior Lower Leg Secondary Diabetic Wound/Ulcer of the Lower Extremity Wounding Event: Trauma Etiology: Date Acquired: 10/23/2019 Wound Open Weeks Of Treatment: 0 Status: Clustered Wound: No Comorbid Glaucoma, Sleep Apnea, Hypertension, Peripheral Arterial Disease, History: Peripheral Venous Disease, Type II Diabetes, Gout, Osteoarthritis, Neuropathy Photos Photo Uploaded By: Mikeal Hawthorne on 07/07/2020 10:36:07 Wound Measurements Length: (cm) 1.2 Width: (cm) 0.9 Depth: (  cm) 0.3 Area: (cm) 0.848 Volume: (cm) 0.254 % Reduction in Area: -12.5% % Reduction in Volume: -238.7% Epithelialization: Small (1-33%) Tunneling: No Undermining: No Wound Description Classification: Full Thickness Without Exposed Support Structures Wound Margin: Distinct, outline attached Exudate Amount: Medium Exudate Type: Serosanguineous Exudate Color: red, brown Foul Odor After Cleansing: No Slough/Fibrino Yes Wound Bed Granulation Amount: Large (67-100%) Exposed Structure Granulation Quality: Pink Fascia Exposed: No Necrotic Amount: Small (1-33%) Fat Layer (Subcutaneous Tissue) Exposed: Yes Necrotic Quality: Adherent Slough Tendon Exposed: No Muscle Exposed: No Joint Exposed: No Bone Exposed: No Treatment Notes Wound #10 (Lower Leg) Wound Laterality: Left, Anterior Cleanser Soap and  Water Discharge Instruction: May shower and wash wound with dial antibacterial soap and water prior to dressing change. Peri-Wound Care Triamcinolone 15 (g) Discharge Instruction: Use triamcinolone 15 (g) as directed Sween Lotion (Moisturizing lotion) Discharge Instruction: Apply moisturizing lotion as directed Topical Primary Dressing Promogran Prisma Matrix, 4.34 (sq in) (silver collagen) Discharge Instruction: Moisten collagen with saline or hydrogel Secondary Dressing ABD Pad, 5x9 Discharge Instruction: Apply over primary dressing as directed. Zetuvit Plus 4x8 in Discharge Instruction: Apply over primary dressing as directed. Secured With Compression Wrap FourPress (4 layer compression wrap) Discharge Instruction: Apply four layer compression as directed. Unna boot under wrap to help secure in place Compression Stockings Add-Ons Electronic Signature(s) Signed: 07/05/2020 2:51:26 PM By: Deon Pilling Entered By: Deon Pilling on 07/05/2020 11:01:35 -------------------------------------------------------------------------------- Wound Assessment Details Patient Name: Date of Service: [name], [name] 07/05/2020 10:15 A M Medical Record Number: [id number] Patient Account Number: [id number] Date of Birth/Sex: Treating RN: 05-03-1951 (70 y.o. [name], [name] Primary Care Loucile Posner: [name] Other Clinician: Referring Loyal Holzheimer: Treating Hazely Sealey/Extender: Stormy Card in Treatment: 0 Wound Status Wound Number: 11 Primary Diabetic Wound/Ulcer of the Lower Extremity Etiology: Wound Location: Left, Medial, Dorsal Foot Secondary Lymphedema Wounding Event: Trauma Etiology: Date Acquired: 05/25/2020 Wound Open Weeks Of Treatment: 0 Status: Clustered Wound: No Comorbid Glaucoma, Sleep Apnea, Hypertension, Peripheral Arterial Disease, History: Peripheral Venous Disease, Type II Diabetes, Gout, Osteoarthritis, Neuropathy Photos Photo Uploaded By:  Mikeal Hawthorne on 07/07/2020 10:33:09 Wound Measurements Length: (cm) 0.3 Width: (cm) 0.6 Depth: (cm) 0.2 Area: (cm) 0.141 Volume: (cm) 0.028 % Reduction in Area: 77.5% % Reduction in Volume: 55.6% Epithelialization: Medium (34-66%) Tunneling: No Undermining: No Wound Description Classification: Grade 2 Wound Margin: Distinct, outline attached Exudate Amount: Medium Exudate Type: Serosanguineous Exudate Color: red, brown Foul Odor After Cleansing: No Slough/Fibrino Yes Wound Bed Granulation Amount: Small (1-33%) Exposed Structure Granulation Quality: Pink, Pale Fascia Exposed: No Necrotic Amount: Large (67-100%) Fat Layer (Subcutaneous Tissue) Exposed: Yes Necrotic Quality: Adherent Slough Tendon Exposed: No Muscle Exposed: No Joint Exposed: No Bone Exposed: No Treatment Notes Wound #11 (Foot) Wound Laterality: Dorsal, Left, Medial Cleanser Soap and Water Discharge Instruction: May shower and wash wound with dial antibacterial soap and water prior to dressing change. Peri-Wound Care Triamcinolone 15 (g) Discharge Instruction: Use triamcinolone 15 (g) as directed Sween Lotion (Moisturizing lotion) Discharge Instruction: Apply moisturizing lotion as directed Topical Primary Dressing KerraCel Ag Gelling Fiber Dressing, 4x5 in (silver alginate) Discharge Instruction: Apply silver alginate to wound bed as instructed Secondary Dressing Woven Gauze Sponge, Non-Sterile 4x4 in Discharge Instruction: Apply over primary dressing as directed. Secured With Compression Wrap FourPress (4 layer compression wrap) Discharge Instruction: Apply four layer compression as directed. Compression Stockings Add-Ons Electronic Signature(s) Signed: 07/05/2020 2:51:26 PM By: Deon Pilling Entered By: Deon Pilling on 07/05/2020 11:02:08 -------------------------------------------------------------------------------- Wound Assessment Details Patient Name: Date  of Service: [name], [name] 07/05/2020 10:15 A M Medical Record Number: [id number] Patient Account Number: [id number] Date of Birth/Sex: Treating RN: Dec 07, 1950 (70 y.o. [name], [name] Primary Care Tiaria Biby: [name] Other Clinician: Referring Kemond Amorin: Treating Jatia Musa/Extender: Stormy Card in Treatment: 0 Wound Status Wound Number: 12 Primary Diabetic Wound/Ulcer of the Lower Extremity Etiology: Wound Location: Left, Proximal, Medial Foot Wound Open Wounding Event: Trauma Status: Date Acquired: 05/25/2020 Comorbid Glaucoma, Sleep Apnea, Hypertension, Peripheral Arterial Disease, Weeks Of Treatment: 0 History: Peripheral Venous Disease, Type II Diabetes, Gout, Osteoarthritis, Clustered Wound: No Neuropathy Photos Photo Uploaded By: Mikeal Hawthorne on 07/07/2020 10:30:10 Wound Measurements Length: (cm) 0.4 Width: (cm) 0.5 Depth: (cm) 0.1 Area: (cm) 0.157 Volume: (cm) 0.016 % Reduction in Area: 63% % Reduction in Volume: 81.2% Epithelialization: Medium (34-66%) Tunneling: No Undermining: No Wound Description Classification: Grade 2 Wound Margin: Distinct, outline attached Exudate Amount: Medium Exudate Type: Serosanguineous Exudate Color: red, brown Foul Odor After Cleansing: No Slough/Fibrino No Wound Bed Granulation Amount: Large (67-100%) Exposed Structure Granulation Quality: Red Fascia Exposed: No Necrotic Amount: None Present (0%) Fat Layer (Subcutaneous Tissue) Exposed: Yes Tendon Exposed: No Muscle Exposed: No Joint Exposed: No Bone Exposed: No Treatment Notes Wound #12 (Foot) Wound Laterality: Left, Medial, Proximal Cleanser Soap and Water Discharge Instruction: May shower and wash wound with dial antibacterial soap and water prior to dressing change. Peri-Wound Care Triamcinolone 15 (g) Discharge Instruction: Use triamcinolone 15 (g) as directed Sween Lotion (Moisturizing lotion) Discharge Instruction: Apply moisturizing lotion as  directed Topical Primary Dressing KerraCel Ag Gelling Fiber Dressing, 4x5 in (silver alginate) Discharge Instruction: Apply silver alginate to wound bed as instructed Secondary Dressing Woven Gauze Sponge, Non-Sterile 4x4 in Discharge Instruction: Apply over primary dressing as directed. Secured With Compression Wrap FourPress (4 layer compression wrap) Discharge Instruction: Apply four layer compression as directed. Compression Stockings Add-Ons Electronic Signature(s) Signed: 07/05/2020 2:51:26 PM By: Deon Pilling Entered By: Deon Pilling on 07/05/2020 11:02:35 -------------------------------------------------------------------------------- Wound Assessment Details Patient Name: Date of Service: [name], [name] 07/05/2020 10:15 A M Medical Record Number: [id number] Patient Account Number: [id number] Date of Birth/Sex: Treating RN: May 09, 1951 (70 y.o. [name], [name] Primary Care Kare Dado: [name] Other Clinician: Referring Hani Campusano: Treating Li Bobo/Extender: Stormy Card in Treatment: 0 Wound Status Wound Number: 13 Primary Diabetic Wound/Ulcer of the Lower Extremity Etiology: Wound Location: Left, Distal, Medial Foot Wound Open Wounding Event: Trauma Status: Date Acquired: 05/25/2020 Comorbid Glaucoma, Sleep Apnea, Hypertension, Peripheral Arterial Disease, Weeks Of Treatment: 0 History: Peripheral Venous Disease, Type II Diabetes, Gout, Osteoarthritis, Clustered Wound: No Neuropathy Photos Photo Uploaded By: Mikeal Hawthorne on 07/07/2020 10:30:10 Wound Measurements Length: (cm) 0.9 Width: (cm) 0.3 Depth: (cm) 0.5 Area: (cm) 0.212 Volume: (cm) 0.106 % Reduction in Area: 10.2% % Reduction in Volume: -341.7% Epithelialization: None Tunneling: No Undermining: Yes Starting Position (o'clock): 12 Ending Position (o'clock): 12 Maximum Distance: (cm) 2.3 Wound Description Classification: Grade 2 Wound Margin: Distinct, outline  attached Exudate Amount: Large Exudate Type: Purulent Exudate Color: yellow, brown, green Foul Odor After Cleansing: Yes Due to Product Use: No Slough/Fibrino No Wound Bed Granulation Amount: Large (67-100%) Exposed Structure Granulation Quality: Pink, Pale Fascia Exposed: No Necrotic Amount: None Present (0%) Fat Layer (Subcutaneous Tissue) Exposed: Yes Tendon Exposed: No Muscle Exposed: No Joint Exposed: No Bone Exposed: No Treatment Notes Wound #13 (Foot) Wound Laterality: Left, Medial, Distal Cleanser Soap and Water Discharge Instruction: May shower and wash wound with dial antibacterial soap and water  prior to dressing change. Peri-Wound Care Triamcinolone 15 (g) Discharge Instruction: Use triamcinolone 15 (g) as directed Sween Lotion (Moisturizing lotion) Discharge Instruction: Apply moisturizing lotion as directed Topical Primary Dressing KerraCel Ag Gelling Fiber Dressing, 4x5 in (silver alginate) Discharge Instruction: Apply silver alginate to wound bed as instructed Secondary Dressing Woven Gauze Sponge, Non-Sterile 4x4 in Discharge Instruction: Apply over primary dressing as directed. Secured With Compression Wrap FourPress (4 layer compression wrap) Discharge Instruction: Apply four layer compression as directed. Compression Stockings Add-Ons Electronic Signature(s) Signed: 07/05/2020 2:51:26 PM By: Deon Pilling Entered By: Deon Pilling on 07/05/2020 11:03:16 -------------------------------------------------------------------------------- Wound Assessment Details Patient Name: Date of Service: [name], [name] 07/05/2020 10:15 A M Medical Record Number: [id number] Patient Account Number: [id number] Date of Birth/Sex: Treating RN: Jul 21, 1950 (70 y.o. [name], [name] Primary Care Pepper Wyndham: [name] Other Clinician: Referring Onda Kattner: Treating Marquinn Meschke/Extender: Stormy Card in Treatment: 0 Wound Status Wound Number: 14  Primary Diabetic Wound/Ulcer of the Lower Extremity Etiology: Wound Location: Left T Great oe Wound Open Wounding Event: Trauma Status: Date Acquired: 05/25/2020 Comorbid Glaucoma, Sleep Apnea, Hypertension, Peripheral Arterial Disease, Weeks Of Treatment: 0 History: Peripheral Venous Disease, Type II Diabetes, Gout, Osteoarthritis, Clustered Wound: No Neuropathy Photos Photo Uploaded By: Mikeal Hawthorne on 07/07/2020 10:33:35 Wound Measurements Length: (cm) 0.4 Width: (cm) 0.3 Depth: (cm) 0.2 Area: (cm) 0.094 Volume: (cm) 0.019 % Reduction in Area: 20.3% % Reduction in Volume: -58.3% Epithelialization: None Tunneling: No Undermining: No Wound Description Classification: Grade 2 Wound Margin: Distinct, outline attached Exudate Amount: Medium Exudate Type: Serosanguineous Exudate Color: red, brown Foul Odor After Cleansing: No Slough/Fibrino No Wound Bed Granulation Amount: Large (67-100%) Exposed Structure Granulation Quality: Red Fascia Exposed: No Necrotic Amount: None Present (0%) Fat Layer (Subcutaneous Tissue) Exposed: Yes Tendon Exposed: No Muscle Exposed: No Joint Exposed: No Bone Exposed: No Treatment Notes Wound #14 (Toe Great) Wound Laterality: Left Cleanser Soap and Water Discharge Instruction: May shower and wash wound with dial antibacterial soap and water prior to dressing change. Peri-Wound Care Triamcinolone 15 (g) Discharge Instruction: Use triamcinolone 15 (g) as directed Sween Lotion (Moisturizing lotion) Discharge Instruction: Apply moisturizing lotion as directed Topical Primary Dressing KerraCel Ag Gelling Fiber Dressing, 4x5 in (silver alginate) Discharge Instruction: Apply silver alginate to wound bed as instructed Secondary Dressing Woven Gauze Sponge, Non-Sterile 4x4 in Discharge Instruction: Apply over primary dressing as directed. Secured With Compression Wrap FourPress (4 layer compression wrap) Discharge Instruction: Apply  four layer compression as directed. Compression Stockings Add-Ons Electronic Signature(s) Signed: 07/05/2020 2:51:26 PM By: Deon Pilling Entered By: Deon Pilling on 07/05/2020 11:03:43 -------------------------------------------------------------------------------- Wound Assessment Details Patient Name: Date of Service: [name], [name] 07/05/2020 10:15 A M Medical Record Number: [id number] Patient Account Number: [id number] Date of Birth/Sex: Treating RN: Mar 04, 1951 (70 y.o. [name], [name] Primary Care Marcelene Weidemann: [name] Other Clinician: Referring [name] Groll: Treating Glenice Ciccone/Extender: Stormy Card in Treatment: 0 Wound Status Wound Number: 15 Primary Diabetic Wound/Ulcer of the Lower Extremity Etiology: Wound Location: Left, Lateral Foot Secondary Lymphedema Wounding Event: Trauma Etiology: Date Acquired: 05/25/2020 Wound Open Weeks Of Treatment: 0 Status: Clustered Wound: No Comorbid Glaucoma, Sleep Apnea, Hypertension, Peripheral Arterial Disease, History: Peripheral Venous Disease, Type II Diabetes, Gout, Osteoarthritis, Neuropathy Photos Photo Uploaded By: Mikeal Hawthorne on 07/07/2020 10:33:09 Wound Measurements Length: (cm) 0.3 Width: (cm) 0.4 Depth: (cm) 0.2 Area: (cm) 0.094 Volume: (cm) 0.019 % Reduction in Area: 85% % Reduction in Volume: 84.9% Epithelialization: None Tunneling: No Wound Description Classification: Grade  2 Wound Margin: Distinct, outline attached Exudate Amount: Medium Exudate Type: Serosanguineous Exudate Color: red, brown Foul Odor After Cleansing: No Slough/Fibrino Yes Wound Bed Granulation Amount: Large (67-100%) Exposed Structure Granulation Quality: Pink, Pale Fascia Exposed: No Necrotic Amount: Small (1-33%) Fat Layer (Subcutaneous Tissue) Exposed: Yes Necrotic Quality: Adherent Slough Tendon Exposed: No Muscle Exposed: No Joint Exposed: No Bone Exposed: No Treatment Notes Wound #15 (Foot)  Wound Laterality: Left, Lateral Cleanser Soap and Water Discharge Instruction: May shower and wash wound with dial antibacterial soap and water prior to dressing change. Peri-Wound Care Triamcinolone 15 (g) Discharge Instruction: Use triamcinolone 15 (g) as directed Sween Lotion (Moisturizing lotion) Discharge Instruction: Apply moisturizing lotion as directed Topical Primary Dressing KerraCel Ag Gelling Fiber Dressing, 4x5 in (silver alginate) Discharge Instruction: Apply silver alginate to wound bed as instructed Secondary Dressing Woven Gauze Sponge, Non-Sterile 4x4 in Discharge Instruction: Apply over primary dressing as directed. Secured With Compression Wrap FourPress (4 layer compression wrap) Discharge Instruction: Apply four layer compression as directed. Compression Stockings Add-Ons Electronic Signature(s) Signed: 07/05/2020 2:51:26 PM By: Deon Pilling Entered By: Deon Pilling on 07/05/2020 11:04:14 -------------------------------------------------------------------------------- Wound Assessment Details Patient Name: Date of Service: [name], [name] 07/05/2020 10:15 A M Medical Record Number: [id number] Patient Account Number: [id number] Date of Birth/Sex: Treating RN: 1951/01/29 (70 y.o. [name], [name] Primary Care Audley Hinojos: [name] Other Clinician: Referring Jesstin Studstill: Treating Quintan Saldivar/Extender: Stormy Card in Treatment: 0 Wound Status Wound Number: 16 Primary Diabetic Wound/Ulcer of the Lower Extremity Etiology: Wound Location: Left Metatarsal head fifth Wound Open Wounding Event: Trauma Status: Date Acquired: 05/25/2020 Comorbid Glaucoma, Sleep Apnea, Hypertension, Peripheral Arterial Disease, Weeks Of Treatment: 0 History: Peripheral Venous Disease, Type II Diabetes, Gout, Osteoarthritis, Clustered Wound: No Neuropathy Photos Photo Uploaded By: Mikeal Hawthorne on 07/07/2020 10:33:36 Wound Measurements Length: (cm)  0.1 Width: (cm) 0.1 Depth: (cm) 0.1 Area: (cm) 0.008 Volume: (cm) 0.001 % Reduction in Area: 95.7% % Reduction in Volume: 94.7% Epithelialization: Large (67-100%) Tunneling: No Undermining: No Wound Description Classification: Grade 2 Wound Margin: Distinct, outline attached Exudate Amount: None Present Foul Odor After Cleansing: No Slough/Fibrino No Wound Bed Granulation Amount: None Present (0%) Exposed Structure Necrotic Amount: None Present (0%) Fascia Exposed: No Fat Layer (Subcutaneous Tissue) Exposed: Yes Tendon Exposed: No Muscle Exposed: No Joint Exposed: No Bone Exposed: No Treatment Notes Wound #16 (Metatarsal head fifth) Wound Laterality: Left Cleanser Soap and Water Discharge Instruction: May shower and wash wound with dial antibacterial soap and water prior to dressing change. Peri-Wound Care Triamcinolone 15 (g) Discharge Instruction: Use triamcinolone 15 (g) as directed Sween Lotion (Moisturizing lotion) Discharge Instruction: Apply moisturizing lotion as directed Topical Primary Dressing KerraCel Ag Gelling Fiber Dressing, 4x5 in (silver alginate) Discharge Instruction: Apply silver alginate to wound bed as instructed Secondary Dressing Woven Gauze Sponge, Non-Sterile 4x4 in Discharge Instruction: Apply over primary dressing as directed. Secured With Compression Wrap FourPress (4 layer compression wrap) Discharge Instruction: Apply four layer compression as directed. Compression Stockings Add-Ons Electronic Signature(s) Signed: 07/05/2020 2:51:26 PM By: Deon Pilling Entered By: Deon Pilling on 07/05/2020 11:05:54 -------------------------------------------------------------------------------- Wound Assessment Details Patient Name: Date of Service: [name], [name] 07/05/2020 10:15 A M Medical Record Number: [id number] Patient Account Number: [id number] Date of Birth/Sex: Treating RN: 06-Mar-1951 (70 y.o. [name] Primary Care  Elgie Landino: [name] Other Clinician: Referring Anju Sereno: Treating Nic Lampe/Extender: Stormy Card in Treatment: 0 Wound Status Wound Number: 9 Primary Open Surgical Wound Etiology: Wound Location: Left, Medial  Lower Leg Secondary Lymphedema Wounding Event: Surgical Injury Etiology: Date Acquired: 10/23/2019 Wound Open Weeks Of Treatment: 0 Status: Clustered Wound: No Comorbid Glaucoma, Sleep Apnea, Hypertension, Peripheral Arterial Disease, History: Peripheral Venous Disease, Type II Diabetes, Gout, Osteoarthritis, Neuropathy Photos Photo Uploaded By: Mikeal Hawthorne on 07/07/2020 10:35:46 Wound Measurements Length: (cm) 1.7 Width: (cm) 0.6 Depth: (cm) 0.5 Area: (cm) 0.801 Volume: (cm) 0.401 Wound Description Classification: Full Thickness Without Exposed Support Structu Wound Margin: Distinct, outline attached Exudate Amount: Medium Exudate Type: Serosanguineous Exudate Color: red, brown res Foul Odor After Cleansing: No Slough/Fibrino Yes % Reduction in Area: 49% % Reduction in Volume: 36.1% Epithelialization: Small (1-33%) Tunneling: No Undermining: No Wound Bed Granulation Amount: Large (67-100%) Exposed Structure Granulation Quality: Red, Pink Fascia Exposed: No Necrotic Amount: Small (1-33%) Fat Layer (Subcutaneous Tissue) Exposed: Yes Necrotic Quality: Adherent Slough Tendon Exposed: No Muscle Exposed: No Joint Exposed: No Bone Exposed: No Treatment Notes Wound #9 (Lower Leg) Wound Laterality: Left, Medial Cleanser Soap and Water Discharge Instruction: May shower and wash wound with dial antibacterial soap and water prior to dressing change. Peri-Wound Care Triamcinolone 15 (g) Discharge Instruction: Use triamcinolone 15 (g) as directed Sween Lotion (Moisturizing lotion) Discharge Instruction: Apply moisturizing lotion as directed Topical Primary Dressing Promogran Prisma Matrix, 4.34 (sq in) (silver collagen) Discharge  Instruction: Moisten collagen with saline or hydrogel Secondary Dressing ABD Pad, 5x9 Discharge Instruction: Apply over primary dressing as directed. Zetuvit Plus 4x8 in Discharge Instruction: Apply over primary dressing as directed. Secured With Compression Wrap FourPress (4 layer compression wrap) Discharge Instruction: Apply four layer compression as directed. Unna boot under wrap to help secure in place Compression Stockings Add-Ons Electronic Signature(s) Signed: 07/05/2020 2:51:26 PM By: Deon Pilling Entered By: Deon Pilling on 07/05/2020 11:01:14 -------------------------------------------------------------------------------- Vitals Details Patient Name: Date of Service: [name]. 07/05/2020 10:15 A M Medical Record Number: [id number] Patient Account Number: [id number] Date of Birth/Sex: Treating RN: 10-15-1950 (70 y.o. [name], [name] Primary Care Dalyn Kjos: [name] Other Clinician: Referring Coutney Wildermuth: Treating Edee Nifong/Extender: Stormy Card in Treatment: 0 Vital Signs Time Taken: 10:39 Temperature (F): 98.4 Height (in): 73 Pulse (bpm): 73 Weight (lbs): 245 Respiratory Rate (breaths/min): 20 Body Mass Index (BMI): 32.3 Blood Pressure (mmHg): 133/79 Capillary Blood Glucose (mg/dl): 126 Reference Range: 80 - 120 mg / dl Electronic Signature(s) Signed: 07/05/2020 12:53:26 PM By: Sandre Kitty Entered By: Sandre Kitty on 07/05/2020 10:40:28

## 2020-07-09 LAB — AEROBIC CULTURE W GRAM STAIN (SUPERFICIAL SPECIMEN): Gram Stain: NONE SEEN

## 2020-07-13 ENCOUNTER — Encounter (HOSPITAL_BASED_OUTPATIENT_CLINIC_OR_DEPARTMENT_OTHER): Payer: BC Managed Care – PPO | Admitting: Physician Assistant

## 2020-07-13 ENCOUNTER — Other Ambulatory Visit: Payer: Self-pay

## 2020-07-13 DIAGNOSIS — E11621 Type 2 diabetes mellitus with foot ulcer: Secondary | ICD-10-CM | POA: Diagnosis not present

## 2020-07-13 NOTE — Progress Notes (Addendum)
ADRON, GEISEL ([id number]) Visit Report for 07/13/2020 Chief Complaint Document Details Patient Name: Date of Service: [name], [name] 07/13/2020 9:30 A M Medical Record Number: [id number] Patient Account Number: [id number] Date of Birth/Sex: Treating RN: 1951-02-06 (70 y.o. [name] Primary Care Provider: [name] Other Clinician: Referring Provider: Treating Provider/Extender: Myles Gip in Treatment: 2 Information Obtained from: Patient Chief Complaint Left leg and foot ulcers Electronic Signature(s) Signed: 07/13/2020 10:26:57 AM By: Worthy Keeler PA-C Entered By: Worthy Keeler on 07/13/2020 10:26:56 -------------------------------------------------------------------------------- Debridement Details Patient Name: Date of Service: [name]. 07/13/2020 9:30 A M Medical Record Number: [id number] Patient Account Number: [id number] Date of Birth/Sex: Treating RN: [birth year] (70 y.o. [name] Primary Care Provider: [name] Other Clinician: Referring Provider: Treating Provider/Extender: Myles Gip in Treatment: 2 Debridement Performed for Assessment: Wound #10 Left,Anterior Lower Leg Performed By: Physician Worthy Keeler, PA Debridement Type: Debridement Severity of Tissue Pre Debridement: Fat layer exposed Level of Consciousness (Pre-procedure): Awake and Alert Pre-procedure Verification/Time Out Yes - 10:30 Taken: Start Time: 10:38 Pain Control: Other : benzocaine 20% spray T Area Debrided (L x W): otal 0.7 (cm) x 0.4 (cm) = 0.28 (cm) Tissue and other material debrided: Viable, Non-Viable, Callus, Subcutaneous, Skin: Epidermis Level: Skin/Subcutaneous Tissue Debridement Description: Excisional Instrument: Curette Bleeding: Minimum Hemostasis Achieved: Pressure Procedural Pain: 0 Post Procedural Pain: 0 Response to Treatment: Procedure was tolerated well Level of Consciousness  (Post- Awake and Alert procedure): Post Debridement Measurements of Total Wound Length: (cm) 0.7 Width: (cm) 0.4 Depth: (cm) 0.1 Volume: (cm) 0.022 Character of Wound/Ulcer Post Debridement: Improved Severity of Tissue Post Debridement: Fat layer exposed Post Procedure Diagnosis Same as Pre-procedure Electronic Signature(s) Signed: 07/13/2020 12:00:41 PM By: Baruch Gouty RN, BSN Signed: 07/13/2020 6:05:28 PM By: Worthy Keeler PA-C Entered By: Baruch Gouty on 07/13/2020 10:43:36 -------------------------------------------------------------------------------- Debridement Details Patient Name: Date of Service: [name]. 07/13/2020 9:30 A M Medical Record Number: [id number] Patient Account Number: [id number] Date of Birth/Sex: Treating RN: [birth year] (70 y.o. [name] Primary Care Provider: [name] Other Clinician: Referring Provider: Treating Provider/Extender: Myles Gip in Treatment: 2 Debridement Performed for Assessment: Wound #9 Left,Medial Lower Leg Performed By: Physician Worthy Keeler, PA Debridement Type: Debridement Level of Consciousness (Pre-procedure): Awake and Alert Pre-procedure Verification/Time Out Yes - 10:30 Taken: Start Time: 10:38 Pain Control: Other : benzocaine 20% spray T Area Debrided (L x W): otal 1.4 (cm) x 0.6 (cm) = 0.84 (cm) Tissue and other material debrided: Viable, Non-Viable, Slough, Subcutaneous, Slough Level: Skin/Subcutaneous Tissue Debridement Description: Excisional Instrument: Curette Bleeding: Minimum Hemostasis Achieved: Pressure Procedural Pain: 0 Post Procedural Pain: 0 Response to Treatment: Procedure was tolerated well Level of Consciousness (Post- Awake and Alert procedure): Post Debridement Measurements of Total Wound Length: (cm) 1.4 Width: (cm) 0.6 Depth: (cm) 0.4 Volume: (cm) 0.264 Character of Wound/Ulcer Post Debridement: Improved Post Procedure  Diagnosis Same as Pre-procedure Electronic Signature(s) Signed: 07/13/2020 12:00:41 PM By: Baruch Gouty RN, BSN Signed: 07/13/2020 6:05:28 PM By: Worthy Keeler PA-C Entered By: Baruch Gouty on 07/13/2020 10:44:20 -------------------------------------------------------------------------------- Debridement Details Patient Name: Date of Service: [name]. 07/13/2020 9:30 A M Medical Record Number: [id number] Patient Account Number: [id number] Date of Birth/Sex: Treating RN: 1950-11-12 (70 y.o. [name] Primary Care Provider: [name] Other Clinician: Referring Provider: Treating Provider/Extender: Myles Gip in Treatment: 2 Debridement Performed  for Assessment: Wound #14 Left T Great oe Performed By: Physician Worthy Keeler, PA Debridement Type: Debridement Severity of Tissue Pre Debridement: Fat layer exposed Level of Consciousness (Pre-procedure): Awake and Alert Pre-procedure Verification/Time Out Yes - 10:30 Taken: Start Time: 10:38 Pain Control: Other : benzocaine 20% spray T Area Debrided (L x W): otal 1.5 (cm) x 1 (cm) = 1.5 (cm) Tissue and other material debrided: Non-Viable, Callus, Skin: Epidermis Level: Skin/Epidermis Debridement Description: Selective/Open Wound Instrument: Curette Bleeding: Minimum Hemostasis Achieved: Pressure End Time: 10:48 Procedural Pain: 0 Post Procedural Pain: 0 Response to Treatment: Procedure was tolerated well Level of Consciousness (Post- Awake and Alert procedure): Post Debridement Measurements of Total Wound Length: (cm) 0.4 Width: (cm) 0.3 Depth: (cm) 0.1 Volume: (cm) 0.009 Character of Wound/Ulcer Post Debridement: Improved Severity of Tissue Post Debridement: Fat layer exposed Post Procedure Diagnosis Same as Pre-procedure Electronic Signature(s) Signed: 07/13/2020 12:00:41 PM By: Baruch Gouty RN, BSN Signed: 07/13/2020 6:05:28 PM By: Worthy Keeler PA-C Entered  By: Baruch Gouty on 07/13/2020 10:48:15 -------------------------------------------------------------------------------- Debridement Details Patient Name: Date of Service: [name]. 07/13/2020 9:30 A M Medical Record Number: [id number] Patient Account Number: [id number] Date of Birth/Sex: Treating RN: 10-13-1950 (70 y.o. [name] Primary Care Provider: [name] Other Clinician: Referring Provider: Treating Provider/Extender: Myles Gip in Treatment: 2 Debridement Performed for Assessment: Wound #13 Left,Distal,Medial Foot Performed By: Physician Worthy Keeler, PA Debridement Type: Debridement Severity of Tissue Pre Debridement: Fat layer exposed Level of Consciousness (Pre-procedure): Awake and Alert Pre-procedure Verification/Time Out Yes - 10:30 Taken: Start Time: 10:47 Pain Control: Other : benzocaine 20% spray T Area Debrided (L x W): otal 4 (cm) x 3.5 (cm) = 14 (cm) Tissue and other material debrided: Viable, Non-Viable, Callus, Slough, Subcutaneous, Skin: Dermis , Skin: Epidermis, Slough Level: Skin/Subcutaneous Tissue Debridement Description: Excisional Instrument: Curette Bleeding: Minimum Hemostasis Achieved: Pressure End Time: 10:57 Procedural Pain: 0 Post Procedural Pain: 0 Response to Treatment: Procedure was tolerated well Level of Consciousness (Post- Awake and Alert procedure): Post Debridement Measurements of Total Wound Length: (cm) 1.8 Width: (cm) 2.9 Depth: (cm) 0.2 Volume: (cm) 0.82 Character of Wound/Ulcer Post Debridement: Improved Severity of Tissue Post Debridement: Fat layer exposed Post Procedure Diagnosis Same as Pre-procedure Electronic Signature(s) Signed: 07/13/2020 12:00:41 PM By: Baruch Gouty RN, BSN Signed: 07/13/2020 6:05:28 PM By: Worthy Keeler PA-C Entered By: Baruch Gouty on 07/13/2020  10:57:55 -------------------------------------------------------------------------------- Debridement Details Patient Name: Date of Service: [name]. 07/13/2020 9:30 A M Medical Record Number: [id number] Patient Account Number: [id number] Date of Birth/Sex: Treating RN: 1951/01/13 (70 y.o. [name] Primary Care Provider: [name] Other Clinician: Referring Provider: Treating Provider/Extender: Myles Gip in Treatment: 2 Debridement Performed for Assessment: Wound #12 Left,Proximal,Medial Foot Performed By: Physician Worthy Keeler, PA Debridement Type: Debridement Severity of Tissue Pre Debridement: Fat layer exposed Level of Consciousness (Pre-procedure): Awake and Alert Pre-procedure Verification/Time Out Yes - 10:30 Taken: Start Time: 10:58 Pain Control: Other : benzocaine 20% spray T Area Debrided (L x W): otal 1.5 (cm) x 1.5 (cm) = 2.25 (cm) Tissue and other material debrided: Viable, Non-Viable, Callus, Slough, Subcutaneous, Skin: Epidermis, Slough Level: Skin/Subcutaneous Tissue Debridement Description: Excisional Instrument: Curette Bleeding: Minimum Hemostasis Achieved: Pressure End Time: 11:02 Procedural Pain: 0 Post Procedural Pain: 0 Response to Treatment: Procedure was tolerated well Level of Consciousness (Post- Awake and Alert procedure): Post Debridement Measurements of Total Wound Length: (cm) 0.4 Width: (cm)  0.4 Depth: (cm) 0.2 Volume: (cm) 0.025 Character of Wound/Ulcer Post Debridement: Improved Severity of Tissue Post Debridement: Fat layer exposed Post Procedure Diagnosis Same as Pre-procedure Electronic Signature(s) Signed: 07/13/2020 12:00:41 PM By: Baruch Gouty RN, BSN Signed: 07/13/2020 6:05:28 PM By: Worthy Keeler PA-C Signed: 07/13/2020 6:05:28 PM By: Worthy Keeler PA-C Entered By: Baruch Gouty on 07/13/2020  11:01:50 -------------------------------------------------------------------------------- HPI Details Patient Name: Date of Service: [name]. 07/13/2020 9:30 A M Medical Record Number: [id number] Patient Account Number: [id number] Date of Birth/Sex: Treating RN: 07-Jun-1951 (70 y.o. [name] Primary Care Provider: [name] Other Clinician: Referring Provider: Treating Provider/Extender: Myles Gip in Treatment: 2 History of Present Illness HPI Description: 10/11/17; Mr. Demetro is a 70 year old man who tells me that in 2015 he slipped down the latter traumatizing his left leg. He developed a wound in the same spot the area that we are currently looking at. He states this closed over for the most part although he always felt it was somewhat unstable. In 2016 he hit the same area with the door of his car had this reopened. He tells me that this is never really closed although sometimes an inflow it remains open on a constant basis. He has not been using any specific dressing to this except for topical antibiotics the nature of which were not really sure. His primary doctor did send him to see Dr. [name] of interventional cardiology. He underwent an angiogram on 08/06/17 and he underwent a PTA and directional atherectomy of the lesser distal SFA and popliteal arteries which resulted in brisk improvement in blood flow. It was noted that he had 2 vessel runoff through the anterior tibial and peroneal. He is also been to see vascular and interventional radiologist. He was not felt to have any significant superficial venous insufficiency. Presumably is not a candidate for any ablation. It was suggested he come here for wound care. The patient is a type II diabetic on insulin. He also has a history of venous insufficiency. ABIs on the left were noncompressible in our clinic 10/21/17; patient we admitted to the clinic last week. 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 We put Iodosorb on him after an aggressive debridement and 3 layer compression. He complained of pain in his ankle and itching with is skin in fact he scratched the area on the medial calf superiorly at the rim of our wraps and he has 2 small open areas in that location today which are new. I changed his primary dressing today to silver collagen. As noted he is already had revascularization and does not have any significant superficial venous insufficiency that would be amenable to ablation 10/28/17; patient admitted to the clinic 2 weeks ago. He has a smaller Wound. Scratch injury from last week revealed. There is large wound over the tibial area. This is smaller. Granulation looks healthy. No need for debridement. 11/04/17; the wound on the left lateral calf looks better. Improved dimensions. Surface of this looks better. We've been maintaining him and Kerlix Coban wraps. He finds this much more comfortable. Silver collagen dressing 11/11/17; left lateral Wound continues to look healthy be making progress. Using a #5 curet I removed removed nonviable skin from the surface of the wound and then necrotic debris from the wound surface. Surface of the wound continues to look healthy. He also has an open area on the left great  toenail bed. We've been using topical antibiotics. 11/19/17; left anterior lateral wound continues to look healthy but it's not closed. He also had a small wound above this on the left leg Initially traumatic wounds in the setting of significant chronic venous insufficiency and stasis dermatitis 11/25/17; left anterior wounds superiorly is closed still a small wound inferiorly. 12/02/17; left anterior tibial area. Arrives today with adherent callus. Post debridement clearly not completely closed. Hydrofera Blue under 3 layer compression. 12/09/17; left anterior tibia. Circumferential eschar however the wound bed  looks stable to improved. We've been using Hydrofera Blue under 3 layer compression 12/17/17; left anterior tibia. Apparently this was felt to be closed however when the wrap was taken off there is a skin tear to reopen wounds in the same area we've been using Hydrofera Blue under 3 layer compression 12/23/17 left anterior tibia. Not close to close this week apparently the [facility] was stuck to this again. Still circumferential eschar requiring debridement. I put a contact layer on this this time under the Hydrofera Blue 12/31/17; left anterior tibia. Wound is better slight amount of hyper-granulation. Using Hydrofera Blue over Adaptic. 01/07/18; left anterior tibia. The wound had some surface eschar however after this was removed he has no open wound.he was already revascularized by Dr. [name] when he came to our clinic with atherectomy of the left SFA and popliteal artery. He was also sent to interventional radiology for venous reflux studies. He was not felt to have significant reflux but certainly has chronic venous changes of his skin with hemosiderin deposition around this area. He will definitely need to lubricate his skin and wear compression stocking and I've talked to him about this. READMISSION 05/26/2018 This is a now 70 year old man we cared for with traumatic wounds on his left anterior lower extremity. He had been previously revascularized during that admission by Dr. [name]. Apparently in follow-up Dr. [name] noted that he had deterioration in his arterial status. He underwent a stent placement in the distal left SFA on 04/22/2018. 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 He went back to the angiography suite on 04/30/2018 he underwent PTA and balloon angioplasty of the occluded left mid anterior tibial artery, thrombotic occlusion went from 100 to 0% which reconstitutes the posterior tibial artery. He had thrombectomy and aspiration of the peroneal artery. The stent  placed in the distal SFA left SFA was still occluded. 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 He has been recuperating [facility] in [facility] after the most recent hospitalization. He is going home tomorrow. He tells me that 2 weeks ago he traumatized the tip of his left fifth toe. He came in urgently for our review of this. This was a history of before I noted that Dr. [name] had already noted dry gangrenous changes of the left fifth toe 06/09/2018; 2-week follow-up. I did contact Dr. [name] after his last appointment and he apparently saw 1 of Dr. Irven Shelling colleagues the next day. He does not follow-up with Dr. [name] himself until Thursday of this week. 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 Nevertheless there is no evidence of infection no drainage and no pain. He had a new area  that this week when we were signing him in today on the left anterior mid tibia area, this is in close proximity to the previous wound we have dealt with in this clinic. 06/23/2018; 2-week follow-up. I did not receive a recent note from Dr. [name] to review today. Our office is trying to obtain this. He is apparently not planning to do further vascular interventions and wondered about compression to try and help with the patient's chronic venous insufficiency. However we are also concerned about the arterial flow. He arrives in clinic today with a new area on the left third toe. The areas on the calf/anterior tibia are close to closing. The left fifth toe is still mummified using Betadine. -In reviewing things with the patient he has what sounds like claudication with mild to moderate amount of activity. 06/27/2018; x-ray of his foot  suggested osteomyelitis of the left third toe. I prescribed Levaquin over the phone while we attempted to arrange a plan of care. However the patient called yesterday to report he had low-grade fever and he came in today acutely. There is been a marked deterioration in the left third toe with spreading cellulitis up into the dorsal left foot. He was referred to the emergency room. Readmission: 06/29/2020 patient presents today for reevaluation here in our clinic he was previously treated by Dr. Dellia Nims at the latter part of 2019 in 2 the beginning of 2020. Subsequently we have not seen him since that time in the interim he did have evaluation with vein and vascular specialist specifically Dr. Anice Paganini who did perform quite extensive work for a left femoral to anterior tibial artery bypass. With that being said in the interim the patient has developed significant lymphedema and has wounds that he tells me have really never healed in regard to the incision site on the left leg. He also has multiple wounds on the feet for various reasons some of which is that he tends to pick at his feet. Fortunately there is no signs of active infection systemically at this time he does have some wounds that are little bit deeper but most are fairly superficial he seems to have good blood flow and overall everything appears to be healthy I see no bone exposed and no obvious signs of osteomyelitis. I do not know that he necessarily needs a x-ray at this point although that something we could consider depending on how things progress. The patient does have a history of lymphedema, diabetes, this is type II, chronic kidney disease stage III, hypertension, and history of peripheral vascular disease. 07/05/2020; patient admitted last week. Is a patient I remember from 2019 he had a spreading infection involving the left foot and we sent him to the hospital. 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 He subsequently had a left femoral to anterior tibial bypass by Dr.Cain vein and vascular. He also has severe lymphedema with chronic skin changes related to that on the left leg. The most problematic area that was new today was on the left medial great toe. This was apparently a small area last week there was purulent drainage which our intake nurse cultured. Also areas on the left medial foot and heel left lateral foot. He has 2 areas on the left medial calf left lateral calf in the setting of the severe lymphedema. 07/13/2020 on evaluation today patient appears to be doing better in my opinion compared to his last visit. The good  news is there is no signs of active infection systemically and locally I do not see any signs of infection either. He did have an x-ray which was negative that is great news he had a culture which showed MRSA but at the same time he is been on the doxycycline which has helped. I do think we may want to extend this for 7 additional days Electronic Signature(s) Signed: 07/13/2020 11:05:46 AM By: Worthy Keeler PA-C Entered By: Worthy Keeler on 07/13/2020 11:05:46 -------------------------------------------------------------------------------- Physical Exam Details Patient Name: Date of Service: [name], [name] 07/13/2020 9:30 A M Medical Record Number: [id number] Patient Account Number: [id number] Date of Birth/Sex: Treating RN: 03-23-1951 (70 y.o. [name] Primary Care Provider: [name] Other Clinician: Referring Provider: Treating Provider/Extender: Myles Gip in Treatment: 2 Constitutional Well-nourished and well-hydrated in no acute distress. Respiratory normal breathing without difficulty. Psychiatric this patient is able to make decisions and demonstrates good insight into disease process. Alert and Oriented x 3. pleasant and cooperative. Notes His wounds currently did require some sharp debridement  at most locations. Post debridement all wounds appear to be doing significantly better some very close to complete resolution and I think may be healed in the next week or 2 at most. The larger area on the first metatarsal head region as well as the medial leg I think is going to be a little bit longer to heal but nonetheless still seems to be doing quite well in my opinion. Electronic Signature(s) Signed: 07/13/2020 11:06:21 AM By: Worthy Keeler PA-C Entered By: Worthy Keeler on 07/13/2020 11:06:20 -------------------------------------------------------------------------------- Physician Orders Details Patient Name: Date of Service: [name]. 07/13/2020 9:30 A M Medical Record Number: [id number] Patient Account Number: [id number] Date of Birth/Sex: Treating RN: 1951-04-30 (70 y.o. [name] Primary Care Provider: [name] Other Clinician: Referring Provider: Treating Provider/Extender: Myles Gip in Treatment: 2 Verbal / Phone Orders: No Diagnosis Coding ICD-10 Coding Code Description E11.621 Type 2 diabetes mellitus with foot ulcer I89.0 Lymphedema, not elsewhere classified 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 I73.89 Other specified peripheral vascular diseases I10 Essential (primary) hypertension N18.30 Chronic kidney disease, stage 3 unspecified Follow-up Appointments Return Appointment in 1 week. Bathing/ Shower/ Hygiene May shower with protection but do not get wound dressing(s) wet. - use cast protector to cover dressing in the shower Edema Control - Lymphedema / SCD / Other Bilateral Lower Extremities Elevate legs to the level of the heart or above for 30 minutes daily and/or when sitting, a frequency of: - throughout the day Avoid standing for long periods of time. Exercise  regularly Moisturize legs daily. - right leg Compression stocking or Garment 20-30 mm/Hg pressure to: - right leg daily Off-Loading Other: - healing sandal left foot. Wound Treatment Wound #10 - Lower Leg Wound Laterality: Left, Anterior Cleanser: Soap and Water 1 x Per Week Discharge Instructions: May shower and wash wound with dial antibacterial soap and water prior to dressing change. Peri-Wound Care: Triamcinolone 15 (g) 1 x Per Week Discharge Instructions: Use triamcinolone 15 (g) mixed with lotion to leg and foot Peri-Wound Care: Sween Lotion (Moisturizing lotion) 1 x Per Week Discharge Instructions: Apply moisturizing lotion as directed Prim Dressing: Promogran Prisma Matrix, 4.34 (sq in) (silver collagen) 1 x Per Week ary Discharge Instructions: Moisten collagen  with saline or hydrogel Secondary Dressing: Woven Gauze Sponge, Non-Sterile 4x4 in 1 x Per Week Discharge Instructions: Apply over primary dressing as directed. Secondary Dressing: ABD Pad, 5x9 1 x Per Week Discharge Instructions: Apply over primary dressing as directed. Compression Wrap: FourPress (4 layer compression wrap) 1 x Per Week Discharge Instructions: Apply four layer compression , unna layer at top to secure wrap Wound #12 - Foot Wound Laterality: Left, Medial, Proximal Cleanser: Soap and Water 1 x Per Week Discharge Instructions: May shower and wash wound with dial antibacterial soap and water prior to dressing change. Peri-Wound Care: Triamcinolone 15 (g) 1 x Per Week Discharge Instructions: Use triamcinolone 15 (g) mixed with lotion to leg and foot Peri-Wound Care: Sween Lotion (Moisturizing lotion) 1 x Per Week Discharge Instructions: Apply moisturizing lotion as directed Prim Dressing: KerraCel Ag Gelling Fiber Dressing, 4x5 in (silver alginate) 1 x Per Week ary Discharge Instructions: Apply silver alginate to wound bed as instructed Secondary Dressing: Woven Gauze Sponge, Non-Sterile 4x4 in 1 x Per  Week Discharge Instructions: Apply over primary dressing as directed. Compression Wrap: FourPress (4 layer compression wrap) 1 x Per Week Discharge Instructions: Apply four layer compression as directed. Wound #13 - Foot Wound Laterality: Left, Medial, Distal Cleanser: Soap and Water 1 x Per Week Discharge Instructions: May shower and wash wound with dial antibacterial soap and water prior to dressing change. Peri-Wound Care: Triamcinolone 15 (g) 1 x Per Week Discharge Instructions: Use triamcinolone 15 (g) mixed with lotion to leg and foot Peri-Wound Care: Sween Lotion (Moisturizing lotion) 1 x Per Week Discharge Instructions: Apply moisturizing lotion as directed Prim Dressing: KerraCel Ag Gelling Fiber Dressing, 4x5 in (silver alginate) 1 x Per Week ary Discharge Instructions: Apply silver alginate to wound bed as instructed Secondary Dressing: Woven Gauze Sponge, Non-Sterile 4x4 in 1 x Per Week Discharge Instructions: Apply over primary dressing as directed. Compression Wrap: FourPress (4 layer compression wrap) 1 x Per Week Discharge Instructions: Apply four layer compression as directed. Wound #14 - T Great oe Wound Laterality: Left Cleanser: Soap and Water 1 x Per Week Discharge Instructions: May shower and wash wound with dial antibacterial soap and water prior to dressing change. Peri-Wound Care: Triamcinolone 15 (g) 1 x Per Week Discharge Instructions: Use triamcinolone 15 (g) as directed Peri-Wound Care: Sween Lotion (Moisturizing lotion) 1 x Per Week Discharge Instructions: Apply moisturizing lotion as directed Prim Dressing: KerraCel Ag Gelling Fiber Dressing, 4x5 in (silver alginate) 1 x Per Week ary Discharge Instructions: Apply silver alginate to wound bed as instructed Secondary Dressing: Woven Gauze Sponge, Non-Sterile 4x4 in 1 x Per Week Discharge Instructions: Apply over primary dressing as directed. Compression Wrap: FourPress (4 layer compression wrap) 1 x Per  Week Discharge Instructions: Apply four layer compression as directed. Wound #9 - Lower Leg Wound Laterality: Left, Medial Cleanser: Soap and Water 1 x Per Week Discharge Instructions: May shower and wash wound with dial antibacterial soap and water prior to dressing change. Peri-Wound Care: Triamcinolone 15 (g) 1 x Per Week Discharge Instructions: Use triamcinolone 15 (g) mixed with lotion to leg and foot Peri-Wound Care: Sween Lotion (Moisturizing lotion) 1 x Per Week Discharge Instructions: Apply moisturizing lotion as directed Prim Dressing: Promogran Prisma Matrix, 4.34 (sq in) (silver collagen) 1 x Per Week ary Discharge Instructions: Moisten collagen with saline or hydrogel Secondary Dressing: Woven Gauze Sponge, Non-Sterile 4x4 in 1 x Per Week Discharge Instructions: Apply over primary dressing as directed. Secondary Dressing: ABD Pad, 5x9 1 x  Per Week Discharge Instructions: Apply over primary dressing as directed. Compression Wrap: FourPress (4 layer compression wrap) 1 x Per Week Discharge Instructions: Apply four layer compression , unna layer at top to secure wrap Patient Medications llergies: No Known Drug Allergies A Notifications Medication Indication Start End 07/13/2020 doxycycline hyclate DOSE 1 - oral 100 mg capsule - 1 capsule oral taken 2 times per day for 7 days. Do not take iron or a multivitamin with this medication Electronic Signature(s) Signed: 07/13/2020 11:08:40 AM By: Worthy Keeler PA-C Entered By: Worthy Keeler on 07/13/2020 11:08:24 -------------------------------------------------------------------------------- Problem List Details Patient Name: Date of Service: [name]. 07/13/2020 9:30 A M Medical Record Number: [id number] Patient Account Number: [id number] Date of Birth/Sex: Treating RN: June 29, 1950 (70 y.o. 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/10/2020 No Yes L97.522 Non-pressure chronic ulcer of other part of left foot with fat layer exposed 06/29/2020 No Yes I73.89 Other specified peripheral vascular diseases 06/29/2020 No Yes I10 Essential (primary) hypertension 06/29/2020 No Yes N18.30 Chronic kidney disease, stage 3 unspecified 06/29/2020 No Yes Inactive Problems Resolved Problems Electronic Signature(s) Signed: 07/13/2020 10:26:39 AM By: Worthy Keeler PA-C Entered By: Worthy Keeler on 07/13/2020 10:26:38 -------------------------------------------------------------------------------- Progress Note Details Patient Name: Date of Service: [name]. 07/13/2020 9:30 A M Medical Record Number: [id number] Patient Account Number: [id number] Date of Birth/Sex: Treating RN: 12-18-1950 (70 y.o. [name] Primary Care Provider: [name] Other Clinician: Referring Provider: Treating Provider/Extender: Myles Gip in Treatment: 2 Subjective Chief Complaint Information obtained from Patient Left leg and foot ulcers History of Present Illness (HPI) 10/11/17; Mr. Adames is a 70 year old man who tells me that in 2015 he slipped down the latter traumatizing his left leg. He developed a wound in the same spot the area that we are currently looking at. He states this closed over for the most part although he always felt it was somewhat unstable. In 2016 he hit the same area with the door of his car had this reopened. He tells me that this is never really closed although sometimes an inflow it remains open on a constant basis. He has not been using any specific dressing  to this except for topical antibiotics the nature of which were not really sure. His primary doctor did send him to see Dr. [name] of interventional cardiology. He underwent an angiogram on 08/06/17 and he underwent a PTA and directional atherectomy of the lesser distal SFA and popliteal arteries which resulted in brisk improvement in blood flow. It was noted that he had 2 vessel runoff through the anterior tibial and peroneal. He is also been to see vascular and interventional radiologist. He was not felt to have any significant superficial venous insufficiency. Presumably is not a candidate for any ablation. It was suggested he come here for wound care. The patient is a type II diabetic on insulin. He also has a history of venous insufficiency. ABIs on the left were noncompressible in our clinic 10/21/17; patient we admitted to the clinic last week. 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 We put Iodosorb on him after an aggressive debridement and 3 layer compression. He complained of pain in his ankle and itching with is skin in fact he scratched the area on the medial calf superiorly at the rim of our wraps and he has 2 small open areas in that location today which are new. I changed his primary dressing today to silver collagen. As noted he is already had revascularization and does not have any significant superficial venous insufficiency that would be amenable to ablation 10/28/17; patient admitted to the clinic 2 weeks ago. He has a smaller Wound. Scratch injury from last week revealed. There is large wound over the tibial area. This is smaller. Granulation looks healthy. No need for debridement. 11/04/17; the wound on the left lateral calf looks better. Improved dimensions. Surface of this looks better. We've been maintaining him and Kerlix Coban wraps. He finds this much more comfortable. Silver collagen dressing 11/11/17; left  lateral Wound continues to look healthy be making progress. Using a #5 curet I removed removed nonviable skin from the surface of the wound and then necrotic debris from the wound surface. Surface of the wound continues to look healthy. ooHe also has an open area on the left great toenail bed. We've been using topical antibiotics. 11/19/17; left anterior lateral wound continues to look healthy but it's not closed. ooHe also had a small wound above this on the left leg ooInitially traumatic wounds in the setting of significant chronic venous insufficiency and stasis dermatitis 11/25/17; left anterior wounds superiorly is closed still a small wound inferiorly. 12/02/17; left anterior tibial area. Arrives today with adherent callus. Post debridement clearly not completely closed. Hydrofera Blue under 3 layer compression. 12/09/17; left anterior tibia. Circumferential eschar however the wound bed looks stable to improved. We've been using Hydrofera Blue under 3 layer compression 12/17/17; left anterior tibia. Apparently this was felt to be closed however when the wrap was taken off there is a skin tear to reopen wounds in the same area we've been using Hydrofera Blue under 3 layer compression 12/23/17 left anterior tibia. Not close to close this week apparently the [facility] was stuck to this again. Still circumferential eschar requiring debridement. I put a contact layer on this this time under the Hydrofera Blue 12/31/17; left anterior tibia. Wound is better slight amount of hyper-granulation. Using Hydrofera Blue over Adaptic. 01/07/18; left anterior tibia. The wound had some surface eschar however after this was removed he has no open wound.he was already revascularized by Dr. [name] when he came to our clinic with atherectomy of the left SFA and popliteal artery. He was also sent to interventional radiology for venous reflux studies. He was not felt to have significant reflux but certainly has chronic  venous changes of his skin with hemosiderin deposition around this area. He will definitely need to lubricate his skin and wear compression stocking and I've talked to him about this. READMISSION 05/26/2018 This is a now 70 year old man we cared for with traumatic wounds on his left anterior lower extremity. He had been previously revascularized during that admission by Dr. [name]. Apparently in follow-up Dr. [name] noted that he had deterioration in his arterial status. He underwent a stent placement in the distal left SFA on 04/22/2018. 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 He went back to the angiography suite on 04/30/2018 he underwent PTA and balloon angioplasty of the occluded left mid anterior tibial artery, thrombotic occlusion went from 100 to  0% which reconstitutes the posterior tibial artery. He had thrombectomy and aspiration of the peroneal artery. The stent placed in the distal SFA left SFA was still occluded. 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 He has been recuperating at [facility] after the most recent hospitalization. He is going home tomorrow. He tells me that 2 weeks ago he traumatized the tip of his left fifth toe. He came in urgently for our review of this. This was a history of before I noted that Dr. [name] had already noted dry gangrenous changes of the left fifth toe 06/09/2018; 2-week follow-up. I did contact Dr. [name] after his last appointment and he apparently saw 1 of Dr. Irven Shelling colleagues the next day. He does not follow-up with Dr. [name] himself until Thursday of this week. 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 Nevertheless there is no evidence  of infection no drainage and no pain. He had a new area that this week when we were signing him in today on the left anterior mid tibia area, this is in close proximity to the previous wound we have dealt with in this clinic. 06/23/2018; 2-week follow-up. I did not receive a recent note from Dr. [name] to review today. Our office is trying to obtain this. He is apparently not planning to do further vascular interventions and wondered about compression to try and help with the patient's chronic venous insufficiency. However we are also concerned about the arterial flow. ooHe arrives in clinic today with a new area on the left third toe. The areas on the calf/anterior tibia are close to closing. The left fifth toe is still mummified using Betadine. -In reviewing things with the patient he has what sounds like claudication with mild to moderate amount of activity. 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 I prescribed Levaquin over the phone while we attempted to arrange a plan of care. However the patient called yesterday to report he had low-grade fever and he came in today acutely. There is been a marked deterioration in the left third toe with spreading cellulitis up into the dorsal left foot. He was referred to the emergency room. Readmission: 06/29/2020 patient presents today for reevaluation here in our clinic he was previously treated by Dr. Dellia Nims at the latter part of 2019 in 2 the beginning of 2020. Subsequently we have not seen him since that time in the interim he did have evaluation with vein and vascular specialist specifically Dr. Anice Paganini who did perform quite extensive work for a left femoral to anterior tibial artery bypass. With that being said in the interim the patient has developed significant lymphedema and has wounds that he tells me have really never healed in regard to the incision site on the left leg. He also has multiple wounds on the feet  for various reasons some of which is that he tends to pick at his feet. Fortunately there is no signs of active infection systemically at this time he does have some wounds that are little bit deeper but most are fairly superficial he seems to have good blood flow and overall everything appears to be healthy I see  no bone exposed and no obvious signs of osteomyelitis. I do not know that he necessarily needs a x-ray at this point although that something we could consider depending on how things progress. The patient does have a history of lymphedema, diabetes, this is type II, chronic kidney disease stage III, hypertension, and history of peripheral vascular disease. 07/05/2020; patient admitted last week. Is a patient I remember from 2019 he had a spreading infection involving the left foot and we sent him to the hospital. 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 He subsequently had a left femoral to anterior tibial bypass by Dr.Cain vein and vascular. He also has severe lymphedema with chronic skin changes related to that on the left leg. The most problematic area that was new today was on the left medial great toe. This was apparently a small area last week there was purulent drainage which our intake nurse cultured. Also areas on the left medial foot and heel left lateral foot. He has 2 areas on the left medial calf left lateral calf in the setting of the severe lymphedema. 07/13/2020 on evaluation today patient appears to be doing better in my opinion compared to his last visit. The good news is there is no signs of active infection systemically and locally I do not see any signs of infection either. He did have an x-ray which was negative that is great news he had a culture which showed MRSA but at the same time he is been on the doxycycline which has helped. I do think we may want to extend this for 7 additional  days Objective Constitutional Well-nourished and well-hydrated in no acute distress. Vitals Time Taken: 9:52 AM, Height: 73 in, Weight: 245 lbs, BMI: 32.3, Temperature: 97.8 F, Pulse: 64 bpm, Respiratory Rate: 18 breaths/min, Blood Pressure: 133/72 mmHg, Capillary Blood Glucose: 102 mg/dl. Respiratory normal breathing without difficulty. Psychiatric this patient is able to make decisions and demonstrates good insight into disease process. Alert and Oriented x 3. pleasant and cooperative. General Notes: His wounds currently did require some sharp debridement at most locations. Post debridement all wounds appear to be doing significantly better some very close to complete resolution and I think may be healed in the next week or 2 at most. The larger area on the first metatarsal head region as well as the medial leg I think is going to be a little bit longer to heal but nonetheless still seems to be doing quite well in my opinion. Integumentary (Hair, Skin) Wound #10 status is Open. Original cause of wound was Trauma. The wound is located on the Left,Anterior Lower Leg. The wound measures 0.7cm length x 0.4cm width x 0.3cm depth; 0.22cm^2 area and 0.066cm^3 volume. There is Fat Layer (Subcutaneous Tissue) exposed. There is no tunneling or undermining noted. There is a medium amount of serosanguineous drainage noted. The wound margin is distinct with the outline attached to the wound base. There is large (67-100%) pink granulation within the wound bed. There is no necrotic tissue within the wound bed. Wound #11 status is Healed - Epithelialized. Original cause of wound was Trauma. The wound is located on the Left,Medial,Dorsal Foot. The wound measures 0cm length x 0cm width x 0cm depth; 0cm^2 area and 0cm^3 volume. There is Fat Layer (Subcutaneous Tissue) exposed. There is no tunneling or undermining noted. There is a none present amount of drainage noted. The wound margin is distinct with the  outline attached to  the wound base. There is no granulation within the wound bed. There is no necrotic tissue within the wound bed. General Notes: callous over wound bed. Wound #12 status is Open. Original cause of wound was Trauma. The wound is located on the Left,Proximal,Medial Foot. The wound measures 0.1cm length x 0.1cm width x 0.1cm depth; 0.008cm^2 area and 0.001cm^3 volume. There is Fat Layer (Subcutaneous Tissue) exposed. There is no tunneling or undermining noted. There is a none present amount of drainage noted. The wound margin is distinct with the outline attached to the wound base. There is no granulation within the wound bed. There is no necrotic tissue within the wound bed. General Notes: callous over wound bed. Wound #13 status is Open. Original cause of wound was Trauma. The wound is located on the Left,Distal,Medial Foot. The wound measures 1.4cm length x 1.2cm width x 0.3cm depth; 1.319cm^2 area and 0.396cm^3 volume. There is Fat Layer (Subcutaneous Tissue) exposed. There is no tunneling or undermining noted. There is a large amount of purulent drainage noted. The wound margin is distinct with the outline attached to the wound base. There is medium (34-66%) pink, pale granulation within the wound bed. There is a medium (34-66%) amount of necrotic tissue within the wound bed including Adherent Slough. Wound #14 status is Open. Original cause of wound was Trauma. The wound is located on the Left T Great. The wound measures 0.4cm length x 0.3cm width oe x 0.3cm depth; 0.094cm^2 area and 0.028cm^3 volume. There is Fat Layer (Subcutaneous Tissue) exposed. There is no tunneling or undermining noted. There is a medium amount of purulent drainage noted. The wound margin is distinct with the outline attached to the wound base. There is large (67-100%) red granulation within the wound bed. There is no necrotic tissue within the wound bed. Wound #15 status is Healed - Epithelialized.  Original cause of wound was Trauma. The wound is located on the Left,Lateral Foot. The wound measures 0cm length x 0cm width x 0cm depth; 0cm^2 area and 0cm^3 volume. There is Fat Layer (Subcutaneous Tissue) exposed. There is no tunneling or undermining noted. There is a none present amount of drainage noted. The wound margin is distinct with the outline attached to the wound base. There is no granulation within the wound bed. There is no necrotic tissue within the wound bed. General Notes: callous over wound bed. Wound #16 status is Healed - Epithelialized. Original cause of wound was Trauma. The wound is located on the Left Metatarsal head fifth. The wound measures 0cm length x 0cm width x 0cm depth; 0cm^2 area and 0cm^3 volume. There is Fat Layer (Subcutaneous Tissue) exposed. There is no tunneling or undermining noted. There is a none present amount of drainage noted. The wound margin is distinct with the outline attached to the wound base. There is no granulation within the wound bed. There is no necrotic tissue within the wound bed. General Notes: callous over wound bed. Wound #9 status is Open. Original cause of wound was Surgical Injury. The wound is located on the Left,Medial Lower Leg. The wound measures 1.4cm length x 0.6cm width x 0.4cm depth; 0.66cm^2 area and 0.264cm^3 volume. There is Fat Layer (Subcutaneous Tissue) exposed. There is no tunneling or undermining noted. There is a medium amount of serosanguineous drainage noted. The wound margin is distinct with the outline attached to the wound base. There is small (1-33%) red, pink granulation within the wound bed. There is a large (67-100%) amount of necrotic tissue within the  wound bed including Adherent Slough. Assessment Active Problems ICD-10 Type 2 diabetes mellitus with foot ulcer Lymphedema, not elsewhere classified Non-pressure chronic ulcer of other part of left lower leg with fat layer exposed Non-pressure chronic ulcer  of other part of left foot with fat layer exposed Other specified peripheral vascular diseases Essential (primary) hypertension Chronic kidney disease, stage 3 unspecified Procedures Wound #10 Pre-procedure diagnosis of Wound #10 is a Lymphedema located on the Left,Anterior Lower Leg .Severity of Tissue Pre Debridement is: Fat layer exposed. There was a Excisional Skin/Subcutaneous Tissue Debridement with a total area of 0.28 sq cm performed by Worthy Keeler, PA. With the following instrument(s): Curette to remove Viable and Non-Viable tissue/material. Material removed includes Callus, Subcutaneous Tissue, and Skin: Epidermis after achieving pain control using Other (benzocaine 20% spray). No specimens were taken. A time out was conducted at 10:30, prior to the start of the procedure. A Minimum amount of bleeding was controlled with Pressure. The procedure was tolerated well with a pain level of 0 throughout and a pain level of 0 following the procedure. Post Debridement Measurements: 0.7cm length x 0.4cm width x 0.1cm depth; 0.022cm^3 volume. Character of Wound/Ulcer Post Debridement is improved. Severity of Tissue Post Debridement is: Fat layer exposed. Post procedure Diagnosis Wound #10: Same as Pre-Procedure Pre-procedure diagnosis of Wound #10 is a Lymphedema located on the Left,Anterior Lower Leg . There was a Four Layer Compression Therapy Procedure by Rhae Hammock, RN. Post procedure Diagnosis Wound #10: Same as Pre-Procedure Wound #12 Pre-procedure diagnosis of Wound #12 is a Diabetic Wound/Ulcer of the Lower Extremity located on the Left,Proximal,Medial Foot .Severity of Tissue Pre Debridement is: Fat layer exposed. There was a Excisional Skin/Subcutaneous Tissue Debridement with a total area of 2.25 sq cm performed by Worthy Keeler, PA. With the following instrument(s): Curette to remove Viable and Non-Viable tissue/material. Material removed includes Callus, Subcutaneous  Tissue, Slough, and Skin: Epidermis after achieving pain control using Other (benzocaine 20% spray). No specimens were taken. A time out was conducted at 10:30, prior to the start of the procedure. A Minimum amount of bleeding was controlled with Pressure. The procedure was tolerated well with a pain level of 0 throughout and a pain level of 0 following the procedure. Post Debridement Measurements: 0.4cm length x 0.4cm width x 0.2cm depth; 0.025cm^3 volume. Character of Wound/Ulcer Post Debridement is improved. Severity of Tissue Post Debridement is: Fat layer exposed. 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 There was a Excisional Skin/Subcutaneous Tissue Debridement with a total area of 14 sq cm performed by Worthy Keeler, PA. With the following instrument(s): Curette to remove Viable and Non-Viable tissue/material. Material removed includes Callus, Subcutaneous Tissue, Slough, Skin: Dermis, and Skin: Epidermis after achieving pain control using Other (benzocaine 20% spray). No specimens were taken. A time out was conducted at 10:30, prior to the start of the procedure. A Minimum amount of bleeding was controlled with Pressure. The procedure was tolerated well with a pain level of 0 throughout and a pain level of 0 following the procedure. Post Debridement Measurements: 1.8cm length x 2.9cm width x 0.2cm depth; 0.82cm^3 volume. Character of Wound/Ulcer Post Debridement is improved. Severity of Tissue Post Debridement is: Fat layer exposed. Post procedure Diagnosis Wound #13: Same as Pre-Procedure Wound #14 Pre-procedure diagnosis of Wound #14 is a  Diabetic Wound/Ulcer of the Lower Extremity located on the Left T Great .Severity of Tissue Pre Debridement is: oe Fat layer exposed. There  was a Selective/Open Wound Skin/Epidermis Debridement with a total area of 1.5 sq cm performed by Worthy Keeler, PA. With the following instrument(s): Curette to remove Non-Viable tissue/material. Material removed includes Callus and Skin: Epidermis and after achieving pain control using Other (benzocaine 20% spray). No specimens were taken. A time out was conducted at 10:30, prior to the start of the procedure. A Minimum amount of bleeding was controlled with Pressure. The procedure was tolerated well with a pain level of 0 throughout and a pain level of 0 following the procedure. Post Debridement Measurements: 0.4cm length x 0.3cm width x 0.1cm depth; 0.009cm^3 volume. Character of Wound/Ulcer Post Debridement is improved. Severity of Tissue Post Debridement is: Fat layer exposed. Post procedure Diagnosis Wound #14: Same as Pre-Procedure Wound #9 Pre-procedure diagnosis of Wound #9 is an Open Surgical Wound located on the Left,Medial Lower Leg . There was a Excisional Skin/Subcutaneous Tissue Debridement with a total area of 0.84 sq cm performed by Worthy Keeler, PA. With the following instrument(s): Curette to remove Viable and Non-Viable tissue/material. Material removed includes Subcutaneous Tissue and Slough and after achieving pain control using Other (benzocaine 20% spray). No specimens were taken. A time out was conducted at 10:30, prior to the start of the procedure. A Minimum amount of bleeding was controlled with Pressure. The procedure was tolerated well with a pain level of 0 throughout and a pain level of 0 following the procedure. Post Debridement Measurements: 1.4cm length x 0.6cm width x 0.4cm depth; 0.264cm^3 volume. Character of Wound/Ulcer Post Debridement is improved. Post procedure Diagnosis Wound #9: Same as Pre-Procedure Plan Follow-up Appointments: Return Appointment in 1 week. Bathing/ Shower/ Hygiene: May shower with protection but do not get wound dressing(s) wet.  - use cast protector to cover dressing in the shower Edema Control - Lymphedema / SCD / Other: Elevate legs to the level of the heart or above for 30 minutes daily and/or when sitting, a frequency of: - throughout the day Avoid standing for long periods of time. Exercise regularly Moisturize legs daily. - right leg Compression stocking or Garment 20-30 mm/Hg pressure to: - right leg daily Off-Loading: Other: - healing sandal left foot. The following medication(s) was prescribed: doxycycline hyclate oral 100 mg capsule 1 1 capsule oral taken 2 times per day for 7 days. Do not take iron or a multivitamin with this medication starting 07/13/2020 WOUND #10: - Lower Leg Wound Laterality: Left, Anterior Cleanser: Soap and Water 1 x Per Week/ Discharge Instructions: May shower and wash wound with dial antibacterial soap and water prior to dressing change. Peri-Wound Care: Triamcinolone 15 (g) 1 x Per Week/ Discharge Instructions: Use triamcinolone 15 (g) mixed with lotion to leg and foot Peri-Wound Care: Sween Lotion (Moisturizing lotion) 1 x Per Week/ Discharge Instructions: Apply moisturizing lotion as directed Prim Dressing: Promogran Prisma Matrix, 4.34 (sq in) (silver collagen) 1 x Per Week/ ary Discharge Instructions: Moisten collagen with saline or hydrogel Secondary Dressing: Woven Gauze Sponge, Non-Sterile 4x4 in 1 x Per Week/ Discharge Instructions: Apply over primary dressing as directed. Secondary Dressing: ABD Pad, 5x9 1 x Per Week/ Discharge Instructions: Apply over primary dressing as directed. Com pression Wrap: FourPress (4 layer compression wrap) 1 x Per Week/ Discharge Instructions: Apply four layer compression , unna layer at top to secure wrap WOUND #12: - Foot Wound Laterality: Left, Medial,  Proximal Cleanser: Soap and Water 1 x Per Week/ Discharge Instructions: May shower and wash wound with dial antibacterial soap and water prior to dressing change. Peri-Wound Care:  Triamcinolone 15 (g) 1 x Per Week/ Discharge Instructions: Use triamcinolone 15 (g) mixed with lotion to leg and foot Peri-Wound Care: Sween Lotion (Moisturizing lotion) 1 x Per Week/ Discharge Instructions: Apply moisturizing lotion as directed Prim Dressing: KerraCel Ag Gelling Fiber Dressing, 4x5 in (silver alginate) 1 x Per Week/ ary Discharge Instructions: Apply silver alginate to wound bed as instructed Secondary Dressing: Woven Gauze Sponge, Non-Sterile 4x4 in 1 x Per Week/ Discharge Instructions: Apply over primary dressing as directed. Com pression Wrap: FourPress (4 layer compression wrap) 1 x Per Week/ Discharge Instructions: Apply four layer compression as directed. WOUND #13: - Foot Wound Laterality: Left, Medial, Distal Cleanser: Soap and Water 1 x Per Week/ Discharge Instructions: May shower and wash wound with dial antibacterial soap and water prior to dressing change. Peri-Wound Care: Triamcinolone 15 (g) 1 x Per Week/ Discharge Instructions: Use triamcinolone 15 (g) mixed with lotion to leg and foot Peri-Wound Care: Sween Lotion (Moisturizing lotion) 1 x Per Week/ Discharge Instructions: Apply moisturizing lotion as directed Prim Dressing: KerraCel Ag Gelling Fiber Dressing, 4x5 in (silver alginate) 1 x Per Week/ ary Discharge Instructions: Apply silver alginate to wound bed as instructed Secondary Dressing: Woven Gauze Sponge, Non-Sterile 4x4 in 1 x Per Week/ Discharge Instructions: Apply over primary dressing as directed. Com pression Wrap: FourPress (4 layer compression wrap) 1 x Per Week/ Discharge Instructions: Apply four layer compression as directed. WOUND #14: - T Great Wound Laterality: Left oe Cleanser: Soap and Water 1 x Per Week/ Discharge Instructions: May shower and wash wound with dial antibacterial soap and water prior to dressing change. Peri-Wound Care: Triamcinolone 15 (g) 1 x Per Week/ Discharge Instructions: Use triamcinolone 15 (g) as  directed Peri-Wound Care: Sween Lotion (Moisturizing lotion) 1 x Per Week/ Discharge Instructions: Apply moisturizing lotion as directed Prim Dressing: KerraCel Ag Gelling Fiber Dressing, 4x5 in (silver alginate) 1 x Per Week/ ary Discharge Instructions: Apply silver alginate to wound bed as instructed Secondary Dressing: Woven Gauze Sponge, Non-Sterile 4x4 in 1 x Per Week/ Discharge Instructions: Apply over primary dressing as directed. Com pression Wrap: FourPress (4 layer compression wrap) 1 x Per Week/ Discharge Instructions: Apply four layer compression as directed. WOUND #9: - Lower Leg Wound Laterality: Left, Medial Cleanser: Soap and Water 1 x Per Week/ Discharge Instructions: May shower and wash wound with dial antibacterial soap and water prior to dressing change. Peri-Wound Care: Triamcinolone 15 (g) 1 x Per Week/ Discharge Instructions: Use triamcinolone 15 (g) mixed with lotion to leg and foot Peri-Wound Care: Sween Lotion (Moisturizing lotion) 1 x Per Week/ Discharge Instructions: Apply moisturizing lotion as directed Prim Dressing: Promogran Prisma Matrix, 4.34 (sq in) (silver collagen) 1 x Per Week/ ary Discharge Instructions: Moisten collagen with saline or hydrogel Secondary Dressing: Woven Gauze Sponge, Non-Sterile 4x4 in 1 x Per Week/ Discharge Instructions: Apply over primary dressing as directed. Secondary Dressing: ABD Pad, 5x9 1 x Per Week/ Discharge Instructions: Apply over primary dressing as directed. Com pression Wrap: FourPress (4 layer compression wrap) 1 x Per Week/ Discharge Instructions: Apply four layer compression , unna layer at top to secure wrap 1. Would recommend currently that we go ahead and initiate treatment with a continuation of doxycycline for an additional 7 days and the patient is in agreement with that plan. 2. I am also going  to recommend that we go ahead and continue with the collagen to the leg ulcers. We will also use alginate to the  foot ulcers as before I think that still done well for him currently and may be ready to switch to collagen on feet next week but I wanted to give that 1 more week for now. 3. I am also can recommend that we going to continue with a 3 layer compression wrap I think that still the best way to go. The patient is in agreement with that plan. We will see patient back for reevaluation in 1 week here in the clinic. If anything worsens or changes patient will contact our office for additional recommendations. Electronic Signature(s) Signed: 07/13/2020 11:09:30 AM By: Worthy Keeler PA-C Entered By: Worthy Keeler on 07/13/2020 11:09:30 -------------------------------------------------------------------------------- SuperBill Details Patient Name: Date of Service: [name]. 07/13/2020 Medical Record Number: [id number] Patient Account Number: [id number] Date of Birth/Sex: Treating RN: 03/08/1951 (70 y.o. 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 Other specified peripheral vascular diseases I10 Essential (primary) hypertension N18.30 Chronic kidney disease, stage 3 unspecified Facility Procedures CPT4 Code: [id number] Description: 15615 - DEB SUBQ TISSUE 20 SQ CM/< ICD-10 Diagnosis Description 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 Modifier: Quantity: 1 CPT4 Code: [id number] Description: 14709 - DEBRIDE WOUND 1ST  20 SQ CM OR < ICD-10 Diagnosis Description L97.522 Non-pressure chronic ulcer of other part of left foot with fat layer exposed Modifier: Quantity: 1 Physician Procedures : CPT4 Code Description Modifier [id number] - WC PHYS LEVEL 4 - EST PT 25 ICD-10 Diagnosis Description E11.621 Type 2 diabetes mellitus with foot ulcer I89.0 Lymphedema, not elsewhere classified L97.822 Non-pressure chronic ulcer of other part of left  lower leg with fat layer exposed L97.522 Non-pressure chronic ulcer of other part of left foot with fat layer exposed Quantity: 1 : [id number] - WC PHYS SUBQ TISS 20 SQ CM ICD-10 Diagnosis Description 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

## 2020-07-13 NOTE — Progress Notes (Signed)
[place], [place] ([id number]) Visit Report for 07/13/2020 Arrival Information Details Patient Name: Date of Service: [name], [name] 07/13/2020 9:30 A M Medical Record Number: [id number] Patient Account Number: [id number] Date of Birth/Sex: Treating RN: [birth year] (70 y.o. [name], [name] Primary Care Thaddus Mcdowell: [name] Other Clinician: Referring Lai Hendriks: Treating Lavayah Vita/Extender: Myles Gip in Treatment: 2 Visit Information History Since Last Visit Added or deleted any medications: No Patient Arrived: Ambulatory Any new allergies or adverse reactions: No Arrival Time: 09:50 Had a fall or experienced change in No Accompanied By: self activities of daily living that may affect Transfer Assistance: None risk of falls: Patient Identification Verified: Yes Signs or symptoms of abuse/neglect since last visito No Secondary Verification Process Completed: Yes Hospitalized since last visit: No Patient Requires Transmission-Based No Implantable device outside of the clinic excluding No Precautions: cellular tissue based products placed in the center Patient Has Alerts: Yes since last visit: Patient Alerts: Patient on Blood Thinner Has Dressing in Place as Prescribed: Yes 01/29/20 LABI 1.17 TBI 0.88 Has Compression in Place as Prescribed: Yes 01/29/20 R 1.23 Pain Present Now: No Electronic Signature(s) Signed: 07/13/2020 5:28:00 PM By: Deon Pilling Entered By: Deon Pilling on 07/13/2020 10:10:10 -------------------------------------------------------------------------------- Compression Therapy Details Patient Name: Date of Service: [name], [name] 07/13/2020 9:30 A M Medical Record Number: [id number] Patient Account Number: [id number] Date of Birth/Sex: Treating RN: 09-13-1950 (70 y.o. [name] Primary Care Cristol Engdahl: [name] Other Clinician: Referring Timiyah Romito: Treating Tylerjames Hoglund/Extender: Myles Gip in Treatment:  2 Compression Therapy Performed for Wound Assessment: Wound #10 Left,Anterior Lower Leg Performed By: Clinician Rhae Hammock, RN Compression Type: Four Layer Post Procedure Diagnosis Same as Pre-procedure Electronic Signature(s) Signed: 07/13/2020 12:00:41 PM By: Baruch Gouty RN, BSN Entered By: Baruch Gouty on 07/13/2020 11:00:35 -------------------------------------------------------------------------------- Encounter Discharge Information Details Patient Name: Date of Service: [name]. 07/13/2020 9:30 A M Medical Record Number: [id number] Patient Account Number: [id number] Date of Birth/Sex: Treating RN: Apr 26, 1951 (70 y.o. [name] Primary Care Johnhenry Tippin: [name] Other Clinician: Referring Charizma Gardiner: Treating Ramond Darnell/Extender: Myles Gip in Treatment: 2 Encounter Discharge Information Items Post Procedure Vitals Discharge Condition: Stable Temperature (F): 97.8 Ambulatory Status: Ambulatory Pulse (bpm): 64 Discharge Destination: Home Respiratory Rate (breaths/min): 18 Transportation: Private Auto Blood Pressure (mmHg): 133/72 Accompanied By: self Schedule Follow-up Appointment: Yes Clinical Summary of Care: Electronic Signature(s) Signed: 07/13/2020 5:28:00 PM By: Deon Pilling Entered By: Deon Pilling on 07/13/2020 11:13:31 -------------------------------------------------------------------------------- Lower Extremity Assessment Details Patient Name: Date of Service: [name], [name] 07/13/2020 9:30 A M Medical Record Number: [id number] Patient Account Number: [id number] Date of Birth/Sex: Treating RN: 1950/07/24 (70 y.o. [name] Primary Care Livianna Petraglia: [name] Other Clinician: Referring Karleigh Bunte: Treating Noella Kipnis/Extender: Myles Gip in Treatment: 2 Edema Assessment Assessed: [name]: Yes] [Right: No] Edema: [Left: Ye] [Right: s] Calf Left: Right: Point of Measurement:  39 cm From Medial Instep 41.7 cm Ankle Left: Right: Point of Measurement: 12 cm From Medial Instep 27.5 cm Electronic Signature(s) Signed: 07/13/2020 5:28:00 PM By: Deon Pilling Entered By: Deon Pilling on 07/13/2020 10:10:45 -------------------------------------------------------------------------------- [place] Details Patient Name: Date of Service: [name]. 07/13/2020 9:30 A M Medical Record Number: [id number] Patient Account Number: [id number] Date of Birth/Sex: Treating RN: 04-04-1951 (70 y.o. [name] Primary Care Rodriques Badie: [name] Other Clinician: Referring Christol Thetford: Treating Adrien Dietzman/Extender: Myles Gip in Treatment: 2 Active  Inactive Nutrition Nursing Diagnoses: Impaired glucose control: actual or potential Potential for alteratiion in Nutrition/Potential for imbalanced nutrition Goals: Patient/caregiver agrees to and verbalizes understanding of need to use nutritional supplements and/or vitamins as prescribed Date Initiated: 06/29/2020 Target Resolution Date: 07/29/2020 Goal Status: Active Patient/caregiver will maintain therapeutic glucose control Date Initiated: 06/29/2020 Target Resolution Date: 07/29/2020 Goal Status: Active Interventions: Assess HgA1c results as ordered upon admission and as needed Assess patient nutrition upon admission and as needed per policy Provide education on elevated blood sugars and impact on wound healing Provide education on nutrition Treatment Activities: Education provided on Nutrition : 06/29/2020 Notes: Venous Leg Ulcer Nursing Diagnoses: Actual venous Insuffiency (use after diagnosis is confirmed) Knowledge deficit related to disease process and management Goals: Patient will maintain optimal edema control Date Initiated: 06/29/2020 Target Resolution Date: 07/29/2020 Goal Status: Active Patient/caregiver will verbalize understanding of disease process and disease  management Date Initiated: 06/29/2020 Target Resolution Date: 07/29/2020 Goal Status: Active Interventions: Assess peripheral edema status every visit. Compression as ordered Provide education on venous insufficiency Notes: Wound/Skin Impairment Nursing Diagnoses: Impaired tissue integrity Knowledge deficit related to ulceration/compromised skin integrity Goals: Patient/caregiver will verbalize understanding of skin care regimen Date Initiated: 06/29/2020 Target Resolution Date: 07/29/2020 Goal Status: Active Interventions: Assess patient/caregiver ability to obtain necessary supplies Assess patient/caregiver ability to perform ulcer/skin care regimen upon admission and as needed Assess ulceration(s) every visit Provide education on ulcer and skin care Notes: Electronic Signature(s) Signed: 07/13/2020 12:00:41 PM By: Baruch Gouty RN, BSN Entered By: Baruch Gouty on 07/13/2020 10:24:14 -------------------------------------------------------------------------------- Pain Assessment Details Patient Name: Date of Service: [name]. 07/13/2020 9:30 A M Medical Record Number: [id number] Patient Account Number: [id number] Date of Birth/Sex: Treating RN: 18-Feb-1951 (70 y.o. [name], Tammi Klippel Primary Care Evalina Tabak: [name] Other Clinician: Referring Rosamae Rocque: Treating Harith Mccadden/Extender: Myles Gip in Treatment: 2 Active Problems Location of Pain Severity and Description of Pain Patient Has Paino No Site Locations Pain Management and Medication Current Pain Management: Medication: No Cold Application: No Rest: No Massage: No Activity: No T.E.N.S.: No Heat Application: No Leg drop or elevation: No Is the Current Pain Management Adequate: Adequate How does your wound impact your activities of daily livingo Sleep: No Bathing: No Appetite: No Relationship With Others: No Bladder Continence: No Emotions: No Bowel Continence: No Work:  No Toileting: No Drive: No Dressing: No Hobbies: No Electronic Signature(s) Signed: 07/13/2020 5:28:00 PM By: Deon Pilling Entered By: Deon Pilling on 07/13/2020 10:10:33 -------------------------------------------------------------------------------- Patient/Caregiver Education Details Patient Name: Date of Service: [name] 1/19/2022andnbsp9:30 A M Medical Record Number: [id number] Patient Account Number: [id number] Date of Birth/Gender: Treating RN: [birth year] (69 y.o. [name] Primary Care Physician: [name] Other Clinician: Referring Physician: Treating Physician/Extender: Myles Gip in Treatment: 2 Education Assessment Education Provided To: Patient Education Topics Provided Elevated Blood Sugar/ Impact on Healing: Methods: Explain/Verbal Responses: Reinforcements needed, State content correctly Venous: Methods: Explain/Verbal Responses: Reinforcements needed, State content correctly Wound/Skin Impairment: Methods: Explain/Verbal Responses: Reinforcements needed, State content correctly Electronic Signature(s) Signed: 07/13/2020 12:00:41 PM By: Baruch Gouty RN, BSN Entered By: Baruch Gouty on 07/13/2020 10:25:12 -------------------------------------------------------------------------------- Wound Assessment Details Patient Name: Date of Service: [name]. 07/13/2020 9:30 A M Medical Record Number: [id number] Patient Account Number: [id number] Date of Birth/Sex: Treating RN: Dec 04, 1950 (70 y.o. [name] Primary Care Chancy Smigiel: [name] Other Clinician: Referring Volney Reierson: Treating Ronrico Dupin/Extender: Myles Gip in Treatment: 2 Wound Status  Wound Number: 10 Primary Lymphedema Etiology: Wound Location: Left, Anterior Lower Leg Secondary Diabetic Wound/Ulcer of the Lower Extremity Wounding Event: Trauma Etiology: Date Acquired: 10/23/2019 Wound Open Weeks Of  Treatment: 2 Status: Clustered Wound: No Comorbid Glaucoma, Sleep Apnea, Hypertension, Peripheral Arterial History: Disease, Peripheral Venous Disease, Type II Diabetes, Gout, Osteoarthritis, Neuropathy Wound Measurements Length: (cm) 0.7 Width: (cm) 0.4 Depth: (cm) 0.3 Area: (cm) 0.22 Volume: (cm) 0.066 % Reduction in Area: 70.8% % Reduction in Volume: 12% Epithelialization: Small (1-33%) Tunneling: No Undermining: No Wound Description Classification: Full Thickness Without Exposed Support Structures Wound Margin: Distinct, outline attached Exudate Amount: Medium Exudate Type: Serosanguineous Exudate Color: red, brown Foul Odor After Cleansing: No Slough/Fibrino No Wound Bed Granulation Amount: Large (67-100%) Exposed Structure Granulation Quality: Pink Fascia Exposed: No Necrotic Amount: None Present (0%) Fat Layer (Subcutaneous Tissue) Exposed: Yes Tendon Exposed: No Muscle Exposed: No Joint Exposed: No Bone Exposed: No Treatment Notes Wound #10 (Lower Leg) Wound Laterality: Left, Anterior Cleanser Soap and Water Discharge Instruction: May shower and wash wound with dial antibacterial soap and water prior to dressing change. Peri-Wound Care Triamcinolone 15 (g) Discharge Instruction: Use triamcinolone 15 (g) mixed with lotion to leg and foot Sween Lotion (Moisturizing lotion) Discharge Instruction: Apply moisturizing lotion as directed Topical Primary Dressing Promogran Prisma Matrix, 4.34 (sq in) (silver collagen) Discharge Instruction: Moisten collagen with saline or hydrogel Secondary Dressing Woven Gauze Sponge, Non-Sterile 4x4 in Discharge Instruction: Apply over primary dressing as directed. ABD Pad, 5x9 Discharge Instruction: Apply over primary dressing as directed. Secured With Compression Wrap FourPress (4 layer compression wrap) Discharge Instruction: Apply four layer compression , unna layer at top to secure wrap Compression  Stockings Add-Ons Electronic Signature(s) Signed: 07/13/2020 5:28:00 PM By: Deon Pilling Entered By: Deon Pilling on 07/13/2020 10:11:40 -------------------------------------------------------------------------------- Wound Assessment Details Patient Name: Date of Service: HERSH, MINNEY 07/13/2020 9:30 A M Medical Record Number: [id number] Patient Account Number: [id number] Date of Birth/Sex: Treating RN: 11/11/1950 (70 y.o. [name] Primary Care Yanuel Tagg: [name] Other Clinician: Referring Lasheba Stevens: Treating Aleea Hendry/Extender: Myles Gip in Treatment: 2 Wound Status Wound Number: 11 Primary Diabetic Wound/Ulcer of the Lower Extremity Etiology: Wound Location: Left, Medial, Dorsal Foot Secondary Lymphedema Wounding Event: Trauma Etiology: Date Acquired: 05/25/2020 Wound Healed - Epithelialized Weeks Of Treatment: 2 Status: Clustered Wound: No Comorbid Glaucoma, Sleep Apnea, Hypertension, Peripheral Arterial History: Disease, Peripheral Venous Disease, Type II Diabetes, Gout, Osteoarthritis, Neuropathy Wound Measurements Length: (cm) Width: (cm) Depth: (cm) Area: (cm) Volume: (cm) 0 % Reduction in Area: 100% 0 % Reduction in Volume: 100% 0 Epithelialization: Large (67-100%) 0 Tunneling: No 0 Undermining: No Wound Description Classification: Grade 2 Wound Margin: Distinct, outline attached Exudate Amount: None Present Foul Odor After Cleansing: No Slough/Fibrino No Wound Bed Granulation Amount: None Present (0%) Exposed Structure Necrotic Amount: None Present (0%) Fascia Exposed: No Fat Layer (Subcutaneous Tissue) Exposed: Yes Tendon Exposed: No Muscle Exposed: No Joint Exposed: No Bone Exposed: No Assessment Notes callous over wound bed. Treatment Notes Wound #11 (Foot) Wound Laterality: Dorsal, Left, Medial Cleanser Peri-Wound Care Topical Primary Dressing Secondary Dressing Secured With Compression  Wrap Compression Stockings Add-Ons Electronic Signature(s) Signed: 07/13/2020 12:00:41 PM By: Baruch Gouty RN, BSN Entered By: Baruch Gouty on 07/13/2020 10:39:37 -------------------------------------------------------------------------------- Wound Assessment Details Patient Name: Date of Service: [name]. 07/13/2020 9:30 A M Medical Record Number: [id number] Patient Account Number: [id number] Date of Birth/Sex: Treating RN: 08-10-1950 (70 y.o. [name] Primary Care Rogers Ditter: [name],  Carloyn Manner Other Clinician: Referring Taseen Marasigan: Treating Darroll Bredeson/Extender: Myles Gip in Treatment: 2 Wound Status Wound Number: 12 Primary Diabetic Wound/Ulcer of the Lower Extremity Etiology: Wound Location: Left, Proximal, Medial Foot Wound Open Wounding Event: Trauma Status: Date Acquired: 05/25/2020 Comorbid Glaucoma, Sleep Apnea, Hypertension, Peripheral Arterial Disease, Weeks Of Treatment: 2 History: Peripheral Venous Disease, Type II Diabetes, Gout, Osteoarthritis, Clustered Wound: No Neuropathy Wound Measurements Length: (cm) 0.1 Width: (cm) 0.1 Depth: (cm) 0.1 Area: (cm) 0.008 Volume: (cm) 0.001 % Reduction in Area: 98.1% % Reduction in Volume: 98.8% Epithelialization: Large (67-100%) Tunneling: No Undermining: No Wound Description Classification: Grade 2 Wound Margin: Distinct, outline attached Exudate Amount: None Present Foul Odor After Cleansing: No Slough/Fibrino No Wound Bed Granulation Amount: None Present (0%) Exposed Structure Necrotic Amount: None Present (0%) Fascia Exposed: No Fat Layer (Subcutaneous Tissue) Exposed: Yes Tendon Exposed: No Muscle Exposed: No Joint Exposed: No Bone Exposed: No Assessment Notes callous over wound bed. Treatment Notes Wound #12 (Foot) Wound Laterality: Left, Medial, Proximal Cleanser Soap and Water Discharge Instruction: May shower and wash wound with dial antibacterial soap and  water prior to dressing change. Peri-Wound Care Triamcinolone 15 (g) Discharge Instruction: Use triamcinolone 15 (g) mixed with lotion to leg and foot Sween Lotion (Moisturizing lotion) Discharge Instruction: Apply moisturizing lotion as directed Topical Primary Dressing KerraCel Ag Gelling Fiber Dressing, 4x5 in (silver alginate) Discharge Instruction: Apply silver alginate to wound bed as instructed Secondary Dressing Woven Gauze Sponge, Non-Sterile 4x4 in Discharge Instruction: Apply over primary dressing as directed. Secured With Compression Wrap FourPress (4 layer compression wrap) Discharge Instruction: Apply four layer compression as directed. Compression Stockings Add-Ons Electronic Signature(s) Signed: 07/13/2020 5:28:00 PM By: Deon Pilling Entered By: Deon Pilling on 07/13/2020 10:12:22 -------------------------------------------------------------------------------- Wound Assessment Details Patient Name: Date of Service: [name]. 07/13/2020 9:30 A M Medical Record Number: [id number] Patient Account Number: [id number] Date of Birth/Sex: Treating RN: 09/14/1950 (70 y.o. [name], [name] Primary Care Kyrus Hyde: [name] Other Clinician: Referring Ahlivia Salahuddin: Treating Nikitia Asbill/Extender: Myles Gip in Treatment: 2 Wound Status Wound Number: 13 Primary Diabetic Wound/Ulcer of the Lower Extremity Etiology: Wound Location: Left, Distal, Medial Foot Wound Open Wounding Event: Trauma Status: Date Acquired: 05/25/2020 Comorbid Glaucoma, Sleep Apnea, Hypertension, Peripheral Arterial Disease, Weeks Of Treatment: 2 History: Peripheral Venous Disease, Type II Diabetes, Gout, Osteoarthritis, Clustered Wound: No Neuropathy Wound Measurements Length: (cm) 1.4 Width: (cm) 1.2 Depth: (cm) 0.3 Area: (cm) 1.319 Volume: (cm) 0.396 % Reduction in Area: -458.9% % Reduction in Volume: -1550% Epithelialization: None Tunneling: No Undermining:  No Wound Description Classification: Grade 2 Wound Margin: Distinct, outline attached Exudate Amount: Large Exudate Type: Purulent Exudate Color: yellow, brown, green Foul Odor After Cleansing: No Slough/Fibrino Yes Wound Bed Granulation Amount: Medium (34-66%) Exposed Structure Granulation Quality: Pink, Pale Fascia Exposed: No Necrotic Amount: Medium (34-66%) Fat Layer (Subcutaneous Tissue) Exposed: Yes Necrotic Quality: Adherent Slough Tendon Exposed: No Muscle Exposed: No Joint Exposed: No Bone Exposed: No Treatment Notes Wound #13 (Foot) Wound Laterality: Left, Medial, Distal Cleanser Soap and Water Discharge Instruction: May shower and wash wound with dial antibacterial soap and water prior to dressing change. Peri-Wound Care Triamcinolone 15 (g) Discharge Instruction: Use triamcinolone 15 (g) mixed with lotion to leg and foot Sween Lotion (Moisturizing lotion) Discharge Instruction: Apply moisturizing lotion as directed Topical Primary Dressing KerraCel Ag Gelling Fiber Dressing, 4x5 in (silver alginate) Discharge Instruction: Apply silver alginate to wound bed as instructed Secondary Dressing Woven Gauze Sponge, Non-Sterile  4x4 in Discharge Instruction: Apply over primary dressing as directed. Secured With Compression Wrap FourPress (4 layer compression wrap) Discharge Instruction: Apply four layer compression as directed. Compression Stockings Add-Ons Electronic Signature(s) Signed: 07/13/2020 5:28:00 PM By: Deon Pilling Entered By: Deon Pilling on 07/13/2020 10:13:14 -------------------------------------------------------------------------------- Wound Assessment Details Patient Name: Date of Service: FONG, MCCARRY 07/13/2020 9:30 A M Medical Record Number: [id number] Patient Account Number: [id number] Date of Birth/Sex: Treating RN: 1951-06-17 (70 y.o. [name], [name] Primary Care Gretta Samons: [name] Other Clinician: Referring  Jancarlo Biermann: Treating Paetyn Pietrzak/Extender: Myles Gip in Treatment: 2 Wound Status Wound Number: 14 Primary Diabetic Wound/Ulcer of the Lower Extremity Etiology: Wound Location: Left T Great oe Wound Open Wounding Event: Trauma Status: Date Acquired: 05/25/2020 Comorbid Glaucoma, Sleep Apnea, Hypertension, Peripheral Arterial Disease, Weeks Of Treatment: 2 History: Peripheral Venous Disease, Type II Diabetes, Gout, Osteoarthritis, Clustered Wound: No Neuropathy Wound Measurements Length: (cm) 0.4 Width: (cm) 0.3 Depth: (cm) 0.3 Area: (cm) 0.094 Volume: (cm) 0.028 % Reduction in Area: 20.3% % Reduction in Volume: -133.3% Epithelialization: None Tunneling: No Undermining: No Wound Description Classification: Grade 2 Wound Margin: Distinct, outline attached Exudate Amount: Medium Exudate Type: Purulent Exudate Color: yellow, brown, green Foul Odor After Cleansing: No Slough/Fibrino Yes Wound Bed Granulation Amount: Large (67-100%) Exposed Structure Granulation Quality: Red Fascia Exposed: No Necrotic Amount: None Present (0%) Fat Layer (Subcutaneous Tissue) Exposed: Yes Tendon Exposed: No Muscle Exposed: No Joint Exposed: No Bone Exposed: No Treatment Notes Wound #14 (Toe Great) Wound Laterality: Left Cleanser Soap and Water Discharge Instruction: May shower and wash wound with dial antibacterial soap and water prior to dressing change. Peri-Wound Care Triamcinolone 15 (g) Discharge Instruction: Use triamcinolone 15 (g) as directed Sween Lotion (Moisturizing lotion) Discharge Instruction: Apply moisturizing lotion as directed Topical Primary Dressing KerraCel Ag Gelling Fiber Dressing, 4x5 in (silver alginate) Discharge Instruction: Apply silver alginate to wound bed as instructed Secondary Dressing Woven Gauze Sponge, Non-Sterile 4x4 in Discharge Instruction: Apply over primary dressing as directed. Secured With Compression  Wrap FourPress (4 layer compression wrap) Discharge Instruction: Apply four layer compression as directed. Compression Stockings Add-Ons Electronic Signature(s) Signed: 07/13/2020 5:28:00 PM By: Deon Pilling Entered By: Deon Pilling on 07/13/2020 10:14:58 -------------------------------------------------------------------------------- Wound Assessment Details Patient Name: Date of Service: DORIN, [place] 07/13/2020 9:30 A M Medical Record Number: [id number] Patient Account Number: [id number] Date of Birth/Sex: Treating RN: 1950-10-17 (70 y.o. [name] Primary Care Azarius Lambson: [name] Other Clinician: Referring Kasandra Fehr: Treating Yaffa Seckman/Extender: Myles Gip in Treatment: 2 Wound Status Wound Number: 15 Primary Diabetic Wound/Ulcer of the Lower Extremity Etiology: Wound Location: Left, Lateral Foot Secondary Lymphedema Wounding Event: Trauma Etiology: Date Acquired: 05/25/2020 Wound Healed - Epithelialized Weeks Of Treatment: 2 Status: Clustered Wound: No Comorbid Glaucoma, Sleep Apnea, Hypertension, Peripheral Arterial History: Disease, Peripheral Venous Disease, Type II Diabetes, Gout, Osteoarthritis, Neuropathy Wound Measurements Length: (cm) Width: (cm) Depth: (cm) Area: (cm) Volume: (cm) 0 % Reduction in Area: 100% 0 % Reduction in Volume: 100% 0 Epithelialization: Large (67-100%) 0 Tunneling: No 0 Undermining: No Wound Description Classification: Grade 2 Wound Margin: Distinct, outline attached Exudate Amount: None Present Foul Odor After Cleansing: No Slough/Fibrino No Wound Bed Granulation Amount: None Present (0%) Exposed Structure Necrotic Amount: None Present (0%) Fascia Exposed: No Fat Layer (Subcutaneous Tissue) Exposed: Yes Tendon Exposed: No Muscle Exposed: No Joint Exposed: No Bone Exposed: No Assessment Notes callous over wound bed. Treatment Notes Wound #15 (Foot) Wound Laterality: Left,  Lateral Cleanser Peri-Wound Care Topical Primary Dressing Secondary Dressing Secured With Compression Wrap Compression Stockings Add-Ons Electronic Signature(s) Signed: 07/13/2020 12:00:41 PM By: Baruch Gouty RN, BSN Entered By: Baruch Gouty on 07/13/2020 10:36:20 -------------------------------------------------------------------------------- Wound Assessment Details Patient Name: Date of Service: [name]. 07/13/2020 9:30 A M Medical Record Number: [id number] Patient Account Number: [id number] Date of Birth/Sex: Treating RN: [birth year] (70 y.o. [name] Primary Care Janese Radabaugh: [name] Other Clinician: Referring Terril Chestnut: Treating Blair Mesina/Extender: Myles Gip in Treatment: 2 Wound Status Wound Number: 16 Primary Diabetic Wound/Ulcer of the Lower Extremity Etiology: Wound Location: Left Metatarsal head fifth Wound Healed - Epithelialized Wounding Event: Trauma Status: Date Acquired: 05/25/2020 Comorbid Glaucoma, Sleep Apnea, Hypertension, Peripheral Arterial Disease, Weeks Of Treatment: 2 History: Peripheral Venous Disease, Type II Diabetes, Gout, Osteoarthritis, Clustered Wound: No Neuropathy Wound Measurements Length: (cm) Width: (cm) Depth: (cm) Area: (cm) Volume: (cm) 0 % Reduction in Area: 100% 0 % Reduction in Volume: 100% 0 Epithelialization: Large (67-100%) 0 Tunneling: No 0 Undermining: No Wound Description Classification: Grade 2 Wound Margin: Distinct, outline attached Exudate Amount: None Present Foul Odor After Cleansing: No Slough/Fibrino No Wound Bed Granulation Amount: None Present (0%) Exposed Structure Necrotic Amount: None Present (0%) Fascia Exposed: No Fat Layer (Subcutaneous Tissue) Exposed: Yes Tendon Exposed: No Muscle Exposed: No Joint Exposed: No Bone Exposed: No Assessment Notes callous over wound bed. Treatment Notes Wound #16 (Metatarsal head fifth) Wound Laterality:  Left Cleanser Peri-Wound Care Topical Primary Dressing Secondary Dressing Secured With Compression Wrap Compression Stockings Add-Ons Electronic Signature(s) Signed: 07/13/2020 12:00:41 PM By: Baruch Gouty RN, BSN Entered By: Baruch Gouty on 07/13/2020 10:38:07 -------------------------------------------------------------------------------- Wound Assessment Details Patient Name: Date of Service: [name]. 07/13/2020 9:30 A M Medical Record Number: [id number] Patient Account Number: [id number] Date of Birth/Sex: Treating RN: 12/23/1950 (70 y.o. [name], [name] Primary Care Shaquill Iseman: [name] Other Clinician: Referring Khyan Oats: Treating Pasha Gadison/Extender: Myles Gip in Treatment: 2 Wound Status Wound Number: 9 Primary Open Surgical Wound Etiology: Wound Location: Left, Medial Lower Leg Secondary Lymphedema Wounding Event: Surgical Injury Etiology: Date Acquired: 10/23/2019 Wound Open Weeks Of Treatment: 2 Status: Clustered Wound: No Comorbid Glaucoma, Sleep Apnea, Hypertension, Peripheral Arterial History: Disease, Peripheral Venous Disease, Type II Diabetes, Gout, Osteoarthritis, Neuropathy Wound Measurements Length: (cm) 1.4 Width: (cm) 0.6 Depth: (cm) 0.4 Area: (cm) 0.66 Volume: (cm) 0.264 % Reduction in Area: 58% % Reduction in Volume: 58% Epithelialization: Small (1-33%) Tunneling: No Undermining: No Wound Description Classification: Full Thickness Without Exposed Support Structures Wound Margin: Distinct, outline attached Exudate Amount: Medium Exudate Type: Serosanguineous Exudate Color: red, brown Foul Odor After Cleansing: No Slough/Fibrino Yes Wound Bed Granulation Amount: Small (1-33%) Exposed Structure Granulation Quality: Red, Pink Fascia Exposed: No Necrotic Amount: Large (67-100%) Fat Layer (Subcutaneous Tissue) Exposed: Yes Necrotic Quality: Adherent Slough Tendon Exposed: No Muscle Exposed:  No Joint Exposed: No Bone Exposed: No Treatment Notes Wound #9 (Lower Leg) Wound Laterality: Left, Medial Cleanser Soap and Water Discharge Instruction: May shower and wash wound with dial antibacterial soap and water prior to dressing change. Peri-Wound Care Triamcinolone 15 (g) Discharge Instruction: Use triamcinolone 15 (g) mixed with lotion to leg and foot Sween Lotion (Moisturizing lotion) Discharge Instruction: Apply moisturizing lotion as directed Topical Primary Dressing Promogran Prisma Matrix, 4.34 (sq in) (silver collagen) Discharge Instruction: Moisten collagen with saline or hydrogel Secondary Dressing Woven Gauze Sponge, Non-Sterile 4x4 in Discharge Instruction: Apply over primary dressing as directed. ABD Pad, 5x9  Discharge Instruction: Apply over primary dressing as directed. Secured With Compression Wrap FourPress (4 layer compression wrap) Discharge Instruction: Apply four layer compression , unna layer at top to secure wrap Compression Stockings Add-Ons Electronic Signature(s) Signed: 07/13/2020 5:28:00 PM By: Deon Pilling Entered By: Deon Pilling on 07/13/2020 10:11:15 -------------------------------------------------------------------------------- Vitals Details Patient Name: Date of Service: [name]. 07/13/2020 9:30 A M Medical Record Number: [id number] Patient Account Number: [id number] Date of Birth/Sex: Treating RN: 08-18-1950 (69 y.o. [name], [name] Primary Care Zaeda Mcferran: [name] Other Clinician: Referring Domique Clapper: Treating Jazon Jipson/Extender: Myles Gip in Treatment: 2 Vital Signs Time Taken: 09:52 Temperature (F): 97.8 Height (in): 73 Pulse (bpm): 64 Weight (lbs): 245 Respiratory Rate (breaths/min): 18 Body Mass Index (BMI): 32.3 Blood Pressure (mmHg): 133/72 Capillary Blood Glucose (mg/dl): 102 Reference Range: 80 - 120 mg / dl Electronic Signature(s) Signed: 07/13/2020 5:28:00 PM By: Deon Pilling Entered By: Deon Pilling on 07/13/2020 10:10:24

## 2020-07-15 ENCOUNTER — Other Ambulatory Visit (HOSPITAL_COMMUNITY): Payer: BC Managed Care – PPO

## 2020-07-15 ENCOUNTER — Ambulatory Visit: Payer: BC Managed Care – PPO | Admitting: Vascular Surgery

## 2020-07-15 ENCOUNTER — Inpatient Hospital Stay (HOSPITAL_COMMUNITY): Admission: RE | Admit: 2020-07-15 | Payer: BC Managed Care – PPO | Source: Ambulatory Visit

## 2020-07-19 ENCOUNTER — Encounter (HOSPITAL_BASED_OUTPATIENT_CLINIC_OR_DEPARTMENT_OTHER): Payer: BC Managed Care – PPO | Admitting: Internal Medicine

## 2020-07-19 ENCOUNTER — Other Ambulatory Visit: Payer: Self-pay

## 2020-07-19 DIAGNOSIS — E11621 Type 2 diabetes mellitus with foot ulcer: Secondary | ICD-10-CM | POA: Diagnosis not present

## 2020-07-20 NOTE — Progress Notes (Signed)
TALMADGE, GANAS ([id number]) Visit Report for 07/19/2020 Arrival Information Details Patient Name: Date of Service: ERVIN, HENSLEY 07/19/2020 8:30 A M Medical Record Number: [id number] Patient Account Number: [id number] Date of Birth/Sex: Treating RN: [birth year] (70 y.o. [name], [name] Primary Care Jaiceon Collister: [name] Other Clinician: Referring Latoria Dry: Treating Kairah Leoni/Extender: Stormy Card in Treatment: 2 Visit Information History Since Last Visit Added or deleted any medications: No Patient Arrived: Ambulatory Any new allergies or adverse reactions: No Arrival Time: 08:47 Had a fall or experienced change in No Accompanied By: self activities of daily living that may affect Transfer Assistance: None risk of falls: Patient Identification Verified: Yes Signs or symptoms of abuse/neglect since last visito No Secondary Verification Process Completed: Yes Hospitalized since last visit: No Patient Requires Transmission-Based Precautions: No Implantable device outside of the clinic excluding No Patient Has Alerts: Yes cellular tissue based products placed in the center Patient Alerts: Patient on Blood Thinner since last visit: 01/29/20 LABI 1.17 TBI 0.88 Has Dressing in Place as Prescribed: Yes 01/29/20 R 1.23 Pain Present Now: Yes Electronic Signature(s) Signed: 07/19/2020 11:41:47 AM By: Sandre Kitty Entered By: Sandre Kitty on 07/19/2020 08:47:39 -------------------------------------------------------------------------------- Compression Therapy Details Patient Name: Date of Service: [name] 07/19/2020 8:30 A M Medical Record Number: [id number] Patient Account Number: [id number] Date of Birth/Sex: Treating RN: Mar 25, 1951 (69 y.o. [name], [name] Primary Care Messi Twedt: [name] Other Clinician: Referring Annagrace Carr: Treating Vena Bassinger/Extender: Stormy Card in Treatment: 2 Compression Therapy Performed  for Wound Assessment: Wound #9 Left,Medial Lower Leg Performed By: Clinician Rhae Hammock, RN Compression Type: Four Layer Post Procedure Diagnosis Same as Pre-procedure Electronic Signature(s) Signed: 07/20/2020 5:56:51 PM By: Rhae Hammock RN Entered By: Rhae Hammock on 07/19/2020 09:34:47 -------------------------------------------------------------------------------- Encounter Discharge Information Details Patient Name: Date of Service: [name] 07/19/2020 8:30 A M Medical Record Number: [id number] Patient Account Number: [id number] Date of Birth/Sex: Treating RN: [birth year] (70 y.o. [name] Primary Care Alyx Gee: [name] Other Clinician: Referring Anaisha Mago: Treating Milbert Bixler/Extender: Stormy Card in Treatment: 2 Encounter Discharge Information Items Post Procedure Vitals Discharge Condition: Stable Temperature (F): 98.4 Ambulatory Status: Ambulatory Pulse (bpm): 59 Discharge Destination: Home Respiratory Rate (breaths/min): 18 Transportation: Private Auto Blood Pressure (mmHg): 132/82 Accompanied By: self Schedule Follow-up Appointment: Yes Clinical Summary of Care: Electronic Signature(s) Signed: 07/19/2020 6:32:06 PM By: Deon Pilling Entered By: Deon Pilling on 07/19/2020 17:36:03 -------------------------------------------------------------------------------- Multi Wound Chart Details Patient Name: Date of Service: [name] 07/19/2020 8:30 A M Medical Record Number: [id number] Patient Account Number: [id number] Date of Birth/Sex: Treating RN: 1951-06-09 (69 y.o. [name], [name] Primary Care Mery Guadalupe: [name] Other Clinician: Referring Timur Nibert: Treating Haylo Fake/Extender: Stormy Card in Treatment: 2 Vital Signs Height(in): 35 Capillary Blood Glucose(mg/dl): 105 Weight(lbs): 245 Pulse(bpm): 47 Body Mass Index(BMI): 77 Blood Pressure(mmHg):  132/82 Temperature(F): 98.0 Respiratory Rate(breaths/min): 18 Photos: [10:No Photos Left, Anterior Lower Leg] [12:No Photos Left, Proximal, Medial Foot] [13:No Photos Left, Distal, Medial Foot] Wound Location: [10:Trauma] [12:Trauma] [13:Trauma] Wounding Event: [10:Lymphedema] [12:Diabetic Wound/Ulcer of the Lower] [13:Diabetic Wound/Ulcer of the Lower] Primary Etiology: [10:Diabetic Wound/Ulcer of the Lower] [12:Extremity N/A] [13:Extremity N/A] Secondary Etiology: [10:Extremity N/A] [12:N/A] [13:Glaucoma, Sleep Apnea,] Comorbid History: [10:10/23/2019] [12:05/25/2020] [13:Hypertension, Peripheral Arterial Disease, Peripheral Venous Disease, Type II Diabetes, Gout, Osteoarthritis, Neuropathy 05/25/2020] Date Acquired: [10:2] [12:2] [13:2] Weeks of Treatment: [10:Open] [12:Healed - Epithelialized] [13:Open] Wound Status: [10:0x0x0] [12:0x0x0] [13:1.4x0.6x0.2] Measurements L x W x D (cm) [10:0] [  12:0] [13:0.66] A (cm) : rea [10:0] [12:0] [13:0.132] Volume (cm) : [10:100.00%] [12:100.00%] [13:-179.70%] % Reduction in Area: [10:100.00%] [12:100.00%] [13:-450.00%] % Reduction in Volume: [10:Full Thickness Without Exposed] [12:Grade 2] [13:Grade 2] Classification: [10:Support Structures N/A] [12:N/A] [13:Large] Exudate Amount: [10:N/A] [12:N/A] [13:Purulent] Exudate Type: [10:N/A] [12:N/A] [13:yellow, brown, green] Exudate Color: [10:N/A] [12:N/A] [13:Distinct, outline attached] Wound Margin: [10:N/A] [12:N/A] [13:Large (67-100%)] Granulation Amount: [10:N/A] [12:N/A] [13:Pink, Pale] Granulation Quality: [10:N/A] [12:N/A] [13:Small (1-33%)] Necrotic A mount: [10:N/A] [12:N/A] [13:Small (1-33%)] Epithelialization: [10:N/A] [12:N/A] [13:Debridement - Excisional] Debridement: [10:N/A] [12:N/A] [13:09:27] Pre-procedure Verification/Time Out Taken: [10:N/A] [12:N/A] [13:Lidocaine] Pain Control: [10:N/A] [12:N/A] [13:Callus, Subcutaneous] Tissue Debrided: [10:N/A] [12:N/A] [13:Skin/Subcutaneous  Tissue] Level: [10:N/A] [12:N/A] [13:0.84] Debridement A (sq cm): [10:rea N/A] [12:N/A] [13:Curette] Instrument: [10:N/A] [12:N/A] [13:Minimum] Bleeding: [10:N/A] [12:N/A] [13:Pressure] Hemostasis A chieved: [10:N/A] [12:N/A] [13:0] Procedural Pain: [10:N/A] [12:N/A] [13:0] Post Procedural Pain: [10:N/A] [12:N/A] [13:Procedure was tolerated well] Debridement Treatment Response: [10:N/A] [12:N/A] [13:1.4x0.6x0.2] Post Debridement Measurements L x W x D (cm) [10:N/A] [12:N/A] [13:0.132] Post Debridement Volume: (cm) [10:N/A] [12:N/A] [13:Debridement] Wound Number: 14 9 N/A Photos: No Photos No Photos N/A Left T Great oe Left, Medial Lower Leg N/A Wound Location: Trauma Surgical Injury N/A Wounding Event: Diabetic Wound/Ulcer of the Lower Open Surgical Wound N/A Primary Etiology: Extremity N/A Lymphedema N/A Secondary Etiology: N/A Glaucoma, Sleep Apnea, N/A Comorbid History: Hypertension, Peripheral Arterial Disease, Peripheral Venous Disease, Type II Diabetes, Gout, Osteoarthritis, Neuropathy 05/25/2020 10/23/2019 N/A Date Acquired: 2 2 N/A Weeks of Treatment: Open Open N/A Wound Status: 0x0x0 0.7x0.3x0.2 N/A Measurements L x W x D (cm) 0 0.165 N/A A (cm) : rea 0 0.033 N/A Volume (cm) : 100.00% 89.50% N/A % Reduction in Area: 100.00% 94.70% N/A % Reduction in Volume: Grade 2 Full Thickness Without Exposed N/A Classification: Support Structures N/A Medium N/A Exudate Amount: N/A Serosanguineous N/A Exudate Type: N/A red, brown N/A Exudate Color: N/A Distinct, outline attached N/A Wound Margin: N/A Large (67-100%) N/A Granulation Amount: N/A Red, Pink N/A Granulation Quality: N/A Small (1-33%) N/A Necrotic Amount: N/A Fat Layer (Subcutaneous Tissue): Yes N/A Exposed Structures: Fascia: No Tendon: No Muscle: No Joint: No Bone: No N/A Small (1-33%) N/A Epithelialization: N/A N/A N/A Debridement: N/A N/A N/A Pain Control: N/A N/A N/A Tissue  Debrided: N/A N/A N/A Level: N/A N/A N/A Debridement A (sq cm): rea N/A N/A N/A Instrument: N/A N/A N/A Bleeding: N/A N/A N/A Hemostasis A chieved: N/A N/A N/A Procedural Pain: N/A N/A N/A Post Procedural Pain: N/A N/A N/A Debridement Treatment Response: N/A N/A N/A Post Debridement Measurements L x W x D (cm) N/A N/A N/A Post Debridement Volume: (cm) N/A Compression Therapy N/A Procedures Performed: Treatment Notes Electronic Signature(s) Signed: 07/19/2020 5:14:25 PM By: Linton Ham MD Signed: 07/20/2020 5:56:51 PM By: Rhae Hammock RN Entered By: Linton Ham on 07/19/2020 09:38:50 -------------------------------------------------------------------------------- Multi-Disciplinary Care Plan Details Patient Name: Date of Service: [name] 07/19/2020 8:30 A M Medical Record Number: [id number] Patient Account Number: [id number] Date of Birth/Sex: Treating RN: 03-01-1951 (70 y.o. [name], [name] Primary Care Zakira Ressel: [name] Other Clinician: Referring Margi Edmundson: Treating Beverly Suriano/Extender: Stormy Card in Treatment: 2 Active Inactive Nutrition Nursing Diagnoses: Impaired glucose control: actual or potential Potential for alteratiion in Nutrition/Potential for imbalanced nutrition Goals: Patient/caregiver agrees to and verbalizes understanding of need to use nutritional supplements and/or vitamins as prescribed Date Initiated: 06/29/2020 Target Resolution Date: 07/29/2020 Goal Status: Active Patient/caregiver will maintain therapeutic glucose control Date Initiated: 06/29/2020 Target Resolution Date: 07/29/2020 Goal Status: Active  Interventions: Assess HgA1c results as ordered upon admission and as needed Assess patient nutrition upon admission and as needed per policy Provide education on elevated blood sugars and impact on wound healing Provide education on nutrition Treatment Activities: Education provided on  Nutrition : 06/29/2020 Notes: Venous Leg Ulcer Nursing Diagnoses: Actual venous Insuffiency (use after diagnosis is confirmed) Knowledge deficit related to disease process and management Goals: Patient will maintain optimal edema control Date Initiated: 06/29/2020 Target Resolution Date: 07/29/2020 Goal Status: Active Patient/caregiver will verbalize understanding of disease process and disease management Date Initiated: 06/29/2020 Target Resolution Date: 07/29/2020 Goal Status: Active Interventions: Assess peripheral edema status every visit. Compression as ordered Provide education on venous insufficiency Notes: Wound/Skin Impairment Nursing Diagnoses: Impaired tissue integrity Knowledge deficit related to ulceration/compromised skin integrity Goals: Patient/caregiver will verbalize understanding of skin care regimen Date Initiated: 06/29/2020 Target Resolution Date: 07/29/2020 Goal Status: Active Interventions: Assess patient/caregiver ability to obtain necessary supplies Assess patient/caregiver ability to perform ulcer/skin care regimen upon admission and as needed Assess ulceration(s) every visit Provide education on ulcer and skin care Notes: Electronic Signature(s) Signed: 07/20/2020 5:56:51 PM By: Rhae Hammock RN Entered By: Rhae Hammock on 07/19/2020 09:36:23 -------------------------------------------------------------------------------- Pain Assessment Details Patient Name: Date of Service: [name] 07/19/2020 8:30 A M Medical Record Number: [id number] Patient Account Number: [id number] Date of Birth/Sex: Treating RN: September 01, 1950 (70 y.o. [name] Primary Care Brandilynn Taormina: [name] Other Clinician: Referring Chelsi Warr: Treating Cheng Dec/Extender: Stormy Card in Treatment: 2 Active Problems Location of Pain Severity and Description of Pain Patient Has Paino Yes Site Locations Rate the pain. Current Pain Level:  4 Pain Management and Medication Current Pain Management: Electronic Signature(s) Signed: 07/19/2020 11:41:47 AM By: Sandre Kitty Signed: 07/20/2020 5:56:51 PM By: Rhae Hammock RN Entered By: Sandre Kitty on 07/19/2020 08:51:12 -------------------------------------------------------------------------------- Patient/Caregiver Education Details Patient Name: Date of Service: [name] 1/25/2022andnbsp8:30 A M Medical Record Number: [id number] Patient Account Number: [id number] Date of Birth/Gender: Treating RN: [birth year] (70 y.o. [name] Primary Care Physician: [name] Other Clinician: Referring Physician: Treating Physician/Extender: Stormy Card in Treatment: 2 Education Assessment Education Provided To: Patient Education Topics Provided Wound/Skin Impairment: Methods: Explain/Verbal Responses: Reinforcements needed Electronic Signature(s) Signed: 07/20/2020 5:56:51 PM By: Rhae Hammock RN Entered By: Rhae Hammock on 07/19/2020 09:38:58 -------------------------------------------------------------------------------- Wound Assessment Details Patient Name: Date of Service: [name] 07/19/2020 8:30 A M Medical Record Number: [id number] Patient Account Number: [id number] Date of Birth/Sex: Treating RN: 19-Jul-1950 (69 y.o. [name], [name] Primary Care Chantale Leugers: [name] Other Clinician: Referring Ubaldo Daywalt: Treating Oniya Mandarino/Extender: Stormy Card in Treatment: 2 Wound Status Wound Number: 10 Primary Etiology: Lymphedema Wound Location: Left, Anterior Lower Leg Secondary Etiology: Diabetic Wound/Ulcer of the Lower Extremity Wounding Event: Trauma Wound Status: Open Date Acquired: 10/23/2019 Weeks Of Treatment: 2 Clustered Wound: No Wound Measurements Length: (cm) Width: (cm) Depth: (cm) Area: (cm) Volume: (cm) 0 % Reduction in Area: 100% 0 % Reduction in  Volume: 100% 0 0 0 Wound Description Classification: Full Thickness Without Exposed Support Structur es Electronic Signature(s) Signed: 07/19/2020 11:41:47 AM By: Sandre Kitty Signed: 07/20/2020 5:56:51 PM By: Rhae Hammock RN Entered By: Sandre Kitty on 07/19/2020 09:07:36 -------------------------------------------------------------------------------- Wound Assessment Details Patient Name: Date of Service: [name] 07/19/2020 8:30 A M Medical Record Number: [id number] Patient Account Number: [id number] Date of Birth/Sex: Treating RN: [birth year] (70 y.o. [name] Primary Care Xadrian Craighead: [name] Other Clinician: Referring Jaydalyn Demattia:  Treating Tahje Borawski/Extender: Stormy Card in Treatment: 2 Wound Status Wound Number: 12 Primary Etiology: Diabetic Wound/Ulcer of the Lower Extremity Wound Location: Left, Proximal, Medial Foot Wound Status: Healed - Epithelialized Wounding Event: Trauma Date Acquired: 05/25/2020 Weeks Of Treatment: 2 Clustered Wound: No Wound Measurements Length: (cm) Width: (cm) Depth: (cm) Area: (cm) Volume: (cm) 0 % Reduction in Area: 100% 0 % Reduction in Volume: 100% 0 0 0 Wound Description Classification: Grade 2 Treatment Notes Wound #12 (Foot) Wound Laterality: Left, Medial, Proximal Cleanser Peri-Wound Care Topical Primary Dressing Secondary Dressing Secured With Compression Wrap Compression Stockings Add-Ons Electronic Signature(s) Signed: 07/20/2020 5:56:51 PM By: Rhae Hammock RN Entered By: Rhae Hammock on 07/19/2020 09:37:05 -------------------------------------------------------------------------------- Wound Assessment Details Patient Name: Date of Service: [name] 07/19/2020 8:30 A M Medical Record Number: [id number] Patient Account Number: [id number] Date of Birth/Sex: Treating RN: 14-Mar-1951 (70 y.o. [name], [name] Primary Care Wendell Nicoson:  [name] Other Clinician: Referring Camren Lipsett: Treating Margeart Allender/Extender: Stormy Card in Treatment: 2 Wound Status Wound Number: 13 Primary Diabetic Wound/Ulcer of the Lower Extremity Etiology: Wound Location: Left, Distal, Medial Foot Wound Open Wounding Event: Trauma Status: Date Acquired: 05/25/2020 Comorbid Glaucoma, Sleep Apnea, Hypertension, Peripheral Arterial Disease, Weeks Of Treatment: 2 History: Peripheral Venous Disease, Type II Diabetes, Gout, Osteoarthritis, Clustered Wound: No Neuropathy Wound Measurements Length: (cm) 1.4 Width: (cm) 0.6 Depth: (cm) 0.2 Area: (cm) 0.66 Volume: (cm) 0.132 % Reduction in Area: -179.7% % Reduction in Volume: -450% Epithelialization: Small (1-33%) Tunneling: No Undermining: No Wound Description Classification: Grade 2 Wound Margin: Distinct, outline attached Exudate Amount: Large Exudate Type: Purulent Exudate Color: yellow, brown, green Foul Odor After Cleansing: No Slough/Fibrino Yes Wound Bed Granulation Amount: Large (67-100%) Exposed Structure Granulation Quality: Pink, Pale Fascia Exposed: No Necrotic Amount: Small (1-33%) Fat Layer (Subcutaneous Tissue) Exposed: Yes Necrotic Quality: Adherent Slough Tendon Exposed: No Muscle Exposed: No Joint Exposed: No Bone Exposed: No Treatment Notes Wound #13 (Foot) Wound Laterality: Left, Medial, Distal Cleanser Soap and Water Discharge Instruction: May shower and wash wound with dial antibacterial soap and water prior to dressing change. Peri-Wound Care Triamcinolone 15 (g) Discharge Instruction: Use triamcinolone 15 (g) mixed with lotion to leg and foot Sween Lotion (Moisturizing lotion) Discharge Instruction: Apply moisturizing lotion as directed Topical Primary Dressing KerraCel Ag Gelling Fiber Dressing, 4x5 in (silver alginate) Discharge Instruction: Apply silver alginate to wound bed as instructed Secondary Dressing Woven Gauze  Sponge, Non-Sterile 4x4 in Discharge Instruction: Apply over primary dressing as directed. Secured With Compression Wrap FourPress (4 layer compression wrap) Discharge Instruction: Apply four layer compression as directed. Compression Stockings Add-Ons Electronic Signature(s) Signed: 07/20/2020 5:56:51 PM By: Rhae Hammock RN Entered By: Rhae Hammock on 07/19/2020 09:37:23 -------------------------------------------------------------------------------- Wound Assessment Details Patient Name: Date of Service: [name] 07/19/2020 8:30 A M Medical Record Number: [id number] Patient Account Number: [id number] Date of Birth/Sex: Treating RN: 01/20/1951 (70 y.o. [name], [place] Primary Care Man Effertz: [name] Other Clinician: Referring Ruchy Wildrick: Treating Sedrick Tober/Extender: Stormy Card in Treatment: 2 Wound Status Wound Number: 14 Primary Etiology: Diabetic Wound/Ulcer of the Lower Extremity Wound Location: Left T Great oe Wound Status: Open Wounding Event: Trauma Date Acquired: 05/25/2020 Weeks Of Treatment: 2 Clustered Wound: No Wound Measurements Length: (cm) Width: (cm) Depth: (cm) Area: (cm) Volume: (cm) 0 % Reduction in Area: 100% 0 % Reduction in Volume: 100% 0 0 0 Wound Description Classification: Grade 2 Electronic Signature(s) Signed: 07/19/2020 11:41:47 AM By: Sandre Kitty  Signed: 07/20/2020 5:56:51 PM By: Rhae Hammock RN Entered By: Sandre Kitty on 07/19/2020 09:07:38 -------------------------------------------------------------------------------- Wound Assessment Details Patient Name: Date of Service: [name] 07/19/2020 8:30 A M Medical Record Number: [id number] Patient Account Number: [id number] Date of Birth/Sex: Treating RN: 06/14/1951 (70 y.o. [name], [name] Primary Care Xuan Mateus: [name] Other Clinician: Referring Linsay Vogt: Treating Makella Buckingham/Extender: Stormy Card in Treatment: 2 Wound Status Wound Number: 9 Primary Open Surgical Wound Etiology: Wound Location: Left, Medial Lower Leg Secondary Lymphedema Wounding Event: Surgical Injury Etiology: Date Acquired: 10/23/2019 Wound Open Weeks Of Treatment: 2 Status: Clustered Wound: No Comorbid Glaucoma, Sleep Apnea, Hypertension, Peripheral Arterial Disease, History: Peripheral Venous Disease, Type II Diabetes, Gout, Osteoarthritis, Neuropathy Wound Measurements Length: (cm) 0.7 Width: (cm) 0.3 Depth: (cm) 0.2 Area: (cm) 0.165 Volume: (cm) 0.033 % Reduction in Area: 89.5% % Reduction in Volume: 94.7% Epithelialization: Small (1-33%) Tunneling: No Undermining: No Wound Description Classification: Full Thickness Without Exposed Support Structures Wound Margin: Distinct, outline attached Exudate Amount: Medium Exudate Type: Serosanguineous Exudate Color: red, brown Foul Odor After Cleansing: No Slough/Fibrino Yes Wound Bed Granulation Amount: Large (67-100%) Exposed Structure Granulation Quality: Red, Pink Fascia Exposed: No Necrotic Amount: Small (1-33%) Fat Layer (Subcutaneous Tissue) Exposed: Yes Necrotic Quality: Adherent Slough Tendon Exposed: No Muscle Exposed: No Joint Exposed: No Bone Exposed: No Treatment Notes Wound #9 (Lower Leg) Wound Laterality: Left, Medial Cleanser Soap and Water Discharge Instruction: May shower and wash wound with dial antibacterial soap and water prior to dressing change. Peri-Wound Care Triamcinolone 15 (g) Discharge Instruction: Use triamcinolone 15 (g) mixed with lotion to leg and foot Sween Lotion (Moisturizing lotion) Discharge Instruction: Apply moisturizing lotion as directed Topical Primary Dressing Promogran Prisma Matrix, 4.34 (sq in) (silver collagen) Discharge Instruction: Moisten collagen with saline or hydrogel Secondary Dressing Woven Gauze Sponge, Non-Sterile 4x4 in Discharge Instruction:  Apply over primary dressing as directed. ABD Pad, 5x9 Discharge Instruction: Apply over primary dressing as directed. Secured With Compression Wrap FourPress (4 layer compression wrap) Discharge Instruction: Apply four layer compression , unna layer at top to secure wrap Compression Stockings Add-Ons Electronic Signature(s) Signed: 07/20/2020 5:56:51 PM By: Rhae Hammock RN Entered By: Rhae Hammock on 07/19/2020 09:37:45 -------------------------------------------------------------------------------- Vitals Details Patient Name: Date of Service: [name] 07/19/2020 8:30 A M Medical Record Number: [id number] Patient Account Number: [id number] Date of Birth/Sex: Treating RN: [birth year] (70 y.o. [name], [name] Primary Care Calhoun Reichardt: [name] Other Clinician: Referring Amerah Puleo: Treating Elma Shands/Extender: Stormy Card in Treatment: 2 Vital Signs Time Taken: 08:50 Temperature (F): 98.0 Height (in): 73 Pulse (bpm): 59 Weight (lbs): 245 Respiratory Rate (breaths/min): 18 Body Mass Index (BMI): 32.3 Blood Pressure (mmHg): 132/82 Capillary Blood Glucose (mg/dl): 105 Reference Range: 80 - 120 mg / dl Electronic Signature(s) Signed: 07/19/2020 11:41:47 AM By: Sandre Kitty Entered By: Sandre Kitty on 07/19/2020 08:51:04

## 2020-07-20 NOTE — Progress Notes (Signed)
ZERICK, PREVETTE ([id number]) Visit Report for 07/19/2020 Debridement Details Patient Name: Date of Service: [name], [name] 07/19/2020 8:30 A M Medical Record Number: [id number] Patient Account Number: [id number] Date of Birth/Sex: Treating RN: February 01, 1951 (70 y.o. [name], [name] Primary Care Provider: [name] Other Clinician: Referring Provider: Treating Provider/Extender: Stormy Card in Treatment: 2 Debridement Performed for Assessment: Wound #13 Left,Distal,Medial Foot Performed By: Physician Ricard Dillon., MD Debridement Type: Debridement Severity of Tissue Pre Debridement: Fat layer exposed Level of Consciousness (Pre-procedure): Awake and Alert Pre-procedure Verification/Time Out Yes - 09:27 Taken: Start Time: 09:27 Pain Control: Lidocaine T Area Debrided (L x W): otal 1.4 (cm) x 0.6 (cm) = 0.84 (cm) Tissue and other material debrided: Viable, Non-Viable, Callus, Subcutaneous, Skin: Dermis Level: Skin/Subcutaneous Tissue Debridement Description: Excisional Instrument: Curette Bleeding: Minimum Hemostasis Achieved: Pressure End Time: 09:27 Procedural Pain: 0 Post Procedural Pain: 0 Response to Treatment: Procedure was tolerated well Level of Consciousness (Post- Awake and Alert procedure): Post Debridement Measurements of Total Wound Length: (cm) 1.4 Width: (cm) 0.6 Depth: (cm) 0.2 Volume: (cm) 0.132 Character of Wound/Ulcer Post Debridement: Improved Severity of Tissue Post Debridement: Fat layer exposed Post Procedure Diagnosis Same as Pre-procedure Electronic Signature(s) Signed: 07/19/2020 5:14:25 PM By: Linton Ham MD Signed: 07/20/2020 5:56:51 PM By: Rhae Hammock RN Entered By: Linton Ham on 07/19/2020 09:39:01 -------------------------------------------------------------------------------- HPI Details Patient Name: Date of Service: [name]. 07/19/2020 8:30 A M Medical Record Number:  [id number] Patient Account Number: [id number] Date of Birth/Sex: Treating RN: 10-26-1950 (70 y.o. [name] Primary Care Provider: [name] Other Clinician: Referring Provider: Treating Provider/Extender: Stormy Card in Treatment: 2 History of Present Illness HPI Description: 10/11/17; [name] is a 70 year old man who tells me that in 2015 he slipped down the latter traumatizing his left leg. He developed a wound in the same spot the area that we are currently looking at. He states this closed over for the most part although he always felt it was somewhat unstable. In 2016 he hit the same area with the door of his car had this reopened. He tells me that this is never really closed although sometimes an inflow it remains open on a constant basis. He has not been using any specific dressing to this except for topical antibiotics the nature of which were not really sure. His primary doctor did send him to see Dr. [name] of interventional cardiology. He underwent an angiogram on 08/06/17 and he underwent a PTA and directional atherectomy of the lesser distal SFA and popliteal arteries which resulted in brisk improvement in blood flow. It was noted that he had 2 vessel runoff through the anterior tibial and peroneal. He is also been to see vascular and interventional radiologist. He was not felt to have any significant superficial venous insufficiency. Presumably is not a candidate for any ablation. It was suggested he come here for wound care. The patient is a type II diabetic on insulin. He also has a history of venous insufficiency. ABIs on the left were noncompressible in our clinic 10/21/17; patient we admitted to the clinic last week. 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 We put Iodosorb on him after an aggressive debridement and 3 layer compression. He complained of pain in his ankle and  itching with is skin in fact he scratched the area on the medial calf superiorly at the rim of our  wraps and he has 2 small open areas in that location today which are new. I changed his primary dressing today to silver collagen. As noted he is already had revascularization and does not have any significant superficial venous insufficiency that would be amenable to ablation 10/28/17; patient admitted to the clinic 2 weeks ago. He has a smaller Wound. Scratch injury from last week revealed. There is large wound over the tibial area. This is smaller. Granulation looks healthy. No need for debridement. 11/04/17; the wound on the left lateral calf looks better. Improved dimensions. Surface of this looks better. We've been maintaining him and Kerlix Coban wraps. He finds this much more comfortable. Silver collagen dressing 11/11/17; left lateral Wound continues to look healthy be making progress. Using a #5 curet I removed removed nonviable skin from the surface of the wound and then necrotic debris from the wound surface. Surface of the wound continues to look healthy. He also has an open area on the left great toenail bed. We've been using topical antibiotics. 11/19/17; left anterior lateral wound continues to look healthy but it's not closed. He also had a small wound above this on the left leg Initially traumatic wounds in the setting of significant chronic venous insufficiency and stasis dermatitis 11/25/17; left anterior wounds superiorly is closed still a small wound inferiorly. 12/02/17; left anterior tibial area. Arrives today with adherent callus. Post debridement clearly not completely closed. Hydrofera Blue under 3 layer compression. 12/09/17; left anterior tibia. Circumferential eschar however the wound bed looks stable to improved. We've been using Hydrofera Blue under 3 layer compression 12/17/17; left anterior tibia. Apparently this was felt to be closed however when the wrap was taken off there  is a skin tear to reopen wounds in the same area we've been using Hydrofera Blue under 3 layer compression 12/23/17 left anterior tibia. Not close to close this week apparently the [facility] was stuck to this again. Still circumferential eschar requiring debridement. I put a contact layer on this this time under the Hydrofera Blue 12/31/17; left anterior tibia. Wound is better slight amount of hyper-granulation. Using Hydrofera Blue over Adaptic. 01/07/18; left anterior tibia. The wound had some surface eschar however after this was removed he has no open wound.he was already revascularized by Dr. [name] when he came to our clinic with atherectomy of the left SFA and popliteal artery. He was also sent to interventional radiology for venous reflux studies. He was not felt to have significant reflux but certainly has chronic venous changes of his skin with hemosiderin deposition around this area. He will definitely need to lubricate his skin and wear compression stocking and I've talked to him about this. READMISSION 05/26/2018 This is a now 70 year old man we cared for with traumatic wounds on his left anterior lower extremity. He had been previously revascularized during that admission by Dr. [name]. Apparently in follow-up Dr. [name] noted that he had deterioration in his arterial status. He underwent a stent placement in the distal left SFA on 04/22/2018. 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 He went back to the angiography suite on 04/30/2018 he underwent PTA and balloon angioplasty of the occluded left mid anterior tibial artery, thrombotic occlusion went from 100 to 0% which reconstitutes the posterior tibial artery. He had thrombectomy and aspiration of the peroneal artery. The stent placed in the distal SFA left SFA was still occluded. He was discharged on Xarelto, it was noted on the discharge summary from this hospitalization that he had gangrene  at the tip of his left fifth  toe and there were expectations this would auto amputate. 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 He has been recuperating at [facility] in [facility] after the most recent hospitalization. He is going home tomorrow. He tells me that 2 weeks ago he traumatized the tip of his left fifth toe. He came in urgently for our review of this. This was a history of before I noted that Dr. [name] had already noted dry gangrenous changes of the left fifth toe 06/09/2018; 2-week follow-up. I did contact Dr. [name] after his last appointment and he apparently saw 1 of Dr. Irven Shelling colleagues the next day. He does not follow-up with Dr. [name] himself until Thursday of this week. 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 Nevertheless there is no evidence of infection no drainage and no pain. He had a new area that this week when we were signing him in today on the left anterior mid tibia area, this is in close proximity to the previous wound we have dealt with in this clinic. 06/23/2018; 2-week follow-up. I did not receive a recent note from Dr. [name] to review today. Our office is trying to obtain this. He is apparently not planning to do further vascular interventions and wondered about compression to try and help with the patient's chronic venous insufficiency. However we are also concerned about the arterial flow. He arrives in clinic today with a new area on the left third toe. The areas on the calf/anterior tibia are close to closing. The left fifth toe is still mummified using Betadine. -In reviewing things with the patient he has what sounds like claudication with mild to moderate amount of activity. 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 I prescribed Levaquin over the phone while we attempted to arrange a plan of care. However the patient called yesterday to report he had low-grade fever and  he came in today acutely. There is been a marked deterioration in the left third toe with spreading cellulitis up into the dorsal left foot. He was referred to the emergency room. Readmission: 06/29/2020 patient presents today for reevaluation here in our clinic he was previously treated by Dr. Dellia Nims at the latter part of 2019 in 2 the beginning of 2020. Subsequently we have not seen him since that time in the interim he did have evaluation with vein and vascular specialist specifically Dr. Anice Paganini who did perform quite extensive work for a left femoral to anterior tibial artery bypass. With that being said in the interim the patient has developed significant lymphedema and has wounds that he tells me have really never healed in regard to the incision site on the left leg. He also has multiple wounds on the feet for various reasons some of which is that he tends to pick at his feet. Fortunately there is no signs of active infection systemically at this time he does have some wounds that are little bit deeper but most are fairly superficial he seems to have good blood flow and overall everything appears to be healthy I see no bone exposed and no obvious signs of osteomyelitis. I do not know that he necessarily needs a x-ray at this point although that something we could consider depending on how things progress. The patient does have a history of lymphedema, diabetes, this is type II, chronic  kidney disease stage III, hypertension, and history of peripheral vascular disease. 07/05/2020; patient admitted last week. Is a patient I remember from 2019 he had a spreading infection involving the left foot and we sent him to the hospital. 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 He subsequently had a left femoral to anterior tibial bypass by Dr.Cain vein and vascular. He also has severe lymphedema with chronic skin changes related to that on the left leg. The most  problematic area that was new today was on the left medial great toe. This was apparently a small area last week there was purulent drainage which our intake nurse cultured. Also areas on the left medial foot and heel left lateral foot. He has 2 areas on the left medial calf left lateral calf in the setting of the severe lymphedema. 07/13/2020 on evaluation today patient appears to be doing better in my opinion compared to his last visit. The good news is there is no signs of active infection systemically and locally I do not see any signs of infection either. He did have an x-ray which was negative that is great news he had a culture which showed MRSA but at the same time he is been on the doxycycline which has helped. I do think we may want to extend this for 7 additional days 1/25; patient admitted to the clinic a few weeks ago. 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 We have been putting him under compression his edema control is a lot better but he is severe verricused skin on the left leg. He is really done quite well he still has an open area on the left medial calf and the left medial first metatarsal head. We have been using silver collagen on the leg silver alginate on the foot Electronic Signature(s) Signed: 07/19/2020 5:14:25 PM By: Linton Ham MD Entered By: Linton Ham on 07/19/2020 09:40:22 -------------------------------------------------------------------------------- Physical Exam Details Patient Name: Date of Service: [name]. 07/19/2020 8:30 A M Medical Record Number: [id number] Patient Account Number: [id number] Date of Birth/Sex: Treating RN: 1950/09/10 (70 y.o. [name] Primary Care Provider: [name] Other Clinician: Referring Provider: Treating Provider/Extender: Stormy Card in Treatment: 2 Constitutional Sitting or standing Blood Pressure is within target range for  patient.. Pulse regular and within target range for patient.Marland Kitchen Respirations regular, non-labored and within target range.. Temperature is normal and within the target range for the patient.Marland Kitchen Appears in no distress. Notes Wound exam; the area on the left leg is still some depth that thick lymphedematous skin around this. No debridement was required On the left first metatarsal head raised thick edges around the wound which I removed with a #3 curette callus skin subcutaneous tissue. Trying to get a healthy margin for epithelialization. No evidence of infection Electronic Signature(s) Signed: 07/19/2020 5:14:25 PM By: Linton Ham MD Entered By: Linton Ham on 07/19/2020 09:41:18 -------------------------------------------------------------------------------- Physician Orders Details Patient Name: Date of Service: [name]. 07/19/2020 8:30 A M Medical Record Number: [id number] Patient Account Number: [id number] Date of Birth/Sex: Treating RN: 03-18-1951 (70 y.o. [name] Primary Care Provider: [name] Other Clinician: Referring Provider: Treating Provider/Extender: Stormy Card in Treatment: 2 Verbal / Phone Orders: No Diagnosis Coding Follow-up Appointments Return Appointment in 1 week. Bathing/ Shower/ Hygiene May shower with protection but do not get wound dressing(s) wet. - use cast protector  to cover dressing in the shower Edema Control - Lymphedema / SCD / Other Bilateral Lower Extremities Elevate legs to the level of the heart or above for 30 minutes daily and/or when sitting, a frequency of: - throughout the day Avoid standing for long periods of time. Exercise regularly Moisturize legs daily. - right leg Compression stocking or Garment 20-30 mm/Hg pressure to: - right leg daily Off-Loading Other: - healing sandal left foot. Wound Treatment Wound #13 - Foot Wound Laterality: Left, Medial, Distal Cleanser: Soap and Water 1 x  Per Week Discharge Instructions: May shower and wash wound with dial antibacterial soap and water prior to dressing change. Peri-Wound Care: Triamcinolone 15 (g) 1 x Per Week Discharge Instructions: Use triamcinolone 15 (g) mixed with lotion to leg and foot Peri-Wound Care: Sween Lotion (Moisturizing lotion) 1 x Per Week Discharge Instructions: Apply moisturizing lotion as directed Prim Dressing: KerraCel Ag Gelling Fiber Dressing, 4x5 in (silver alginate) 1 x Per Week ary Discharge Instructions: Apply silver alginate to wound bed as instructed Secondary Dressing: Woven Gauze Sponge, Non-Sterile 4x4 in 1 x Per Week Discharge Instructions: Apply over primary dressing as directed. Compression Wrap: FourPress (4 layer compression wrap) 1 x Per Week Discharge Instructions: Apply four layer compression as directed. Wound #9 - Lower Leg Wound Laterality: Left, Medial Cleanser: Soap and Water 1 x Per Week Discharge Instructions: May shower and wash wound with dial antibacterial soap and water prior to dressing change. Peri-Wound Care: Triamcinolone 15 (g) 1 x Per Week Discharge Instructions: Use triamcinolone 15 (g) mixed with lotion to leg and foot Peri-Wound Care: Sween Lotion (Moisturizing lotion) 1 x Per Week Discharge Instructions: Apply moisturizing lotion as directed Prim Dressing: Promogran Prisma Matrix, 4.34 (sq in) (silver collagen) 1 x Per Week ary Discharge Instructions: Moisten collagen with saline or hydrogel Secondary Dressing: Woven Gauze Sponge, Non-Sterile 4x4 in 1 x Per Week Discharge Instructions: Apply over primary dressing as directed. Secondary Dressing: ABD Pad, 5x9 1 x Per Week Discharge Instructions: Apply over primary dressing as directed. Compression Wrap: FourPress (4 layer compression wrap) 1 x Per Week Discharge Instructions: Apply four layer compression , unna layer at top to secure wrap Electronic Signature(s) Signed: 07/19/2020 5:14:25 PM By: Linton Ham  MD Signed: 07/20/2020 5:56:51 PM By: Rhae Hammock RN Entered By: Rhae Hammock on 07/19/2020 09:43:04 -------------------------------------------------------------------------------- Problem List Details Patient Name: Date of Service: [name]. 07/19/2020 8:30 A M Medical Record Number: [id number] Patient Account Number: [id number] Date of Birth/Sex: Treating RN: 1951/03/16 (70 y.o. [name], [name] Primary Care Provider: [name] Other Clinician: Referring Provider: Treating Provider/Extender: Stormy Card in Treatment: 2 Active Problems ICD-10 Encounter Code Description Active Date MDM Diagnosis E11.621 Type 2 diabetes mellitus with foot ulcer 06/29/2020 No Yes I89.0 Lymphedema, not elsewhere classified 06/29/2020 No Yes L97.522 Non-pressure chronic ulcer of other part of left foot with fat layer exposed 06/29/2020 No Yes I73.89 Other specified peripheral vascular diseases 06/29/2020 No Yes I10 Essential (primary) hypertension 06/29/2020 No Yes N18.30 Chronic kidney disease, stage 3 unspecified 06/29/2020 No Yes Inactive Problems ICD-10 Code Description Active Date Inactive Date L97.822 Non-pressure chronic ulcer of other part of left lower leg with fat layer exposed 06/29/2020 06/29/2020 Resolved Problems Electronic Signature(s) Signed: 07/19/2020 5:14:25 PM By: Linton Ham MD Entered By: Linton Ham on 07/19/2020 09:38:30 -------------------------------------------------------------------------------- Progress Note Details Patient Name: Date of Service: [name]. 07/19/2020 8:30 A M Medical Record Number: [id number] Patient Account Number: [id number] Date of Birth/Sex:  Treating RN: 1951/03/15 (70 y.o. [name] Primary Care Provider: [name] Other Clinician: Referring Provider: Treating Provider/Extender: Stormy Card in Treatment: 2 Subjective History of Present Illness (HPI) 10/11/17;  Mr. Junker is a 70 year old man who tells me that in 2015 he slipped down the latter traumatizing his left leg. He developed a wound in the same spot the area that we are currently looking at. He states this closed over for the most part although he always felt it was somewhat unstable. In 2016 he hit the same area with the door of his car had this reopened. He tells me that this is never really closed although sometimes an inflow it remains open on a constant basis. He has not been using any specific dressing to this except for topical antibiotics the nature of which were not really sure. His primary doctor did send him to see Dr. [name] of interventional cardiology. He underwent an angiogram on 08/06/17 and he underwent a PTA and directional atherectomy of the lesser distal SFA and popliteal arteries which resulted in brisk improvement in blood flow. It was noted that he had 2 vessel runoff through the anterior tibial and peroneal. He is also been to see vascular and interventional radiologist. He was not felt to have any significant superficial venous insufficiency. Presumably is not a candidate for any ablation. It was suggested he come here for wound care. The patient is a type II diabetic on insulin. He also has a history of venous insufficiency. ABIs on the left were noncompressible in our clinic 10/21/17; patient we admitted to the clinic last week. 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 We put Iodosorb on him after an aggressive debridement and 3 layer compression. He complained of pain in his ankle and itching with is skin in fact he scratched the area on the medial calf superiorly at the rim of our wraps and he has 2 small open areas in that location today which are new. I changed his primary dressing today to silver collagen. As noted he is already had revascularization and does not have any significant superficial venous  insufficiency that would be amenable to ablation 10/28/17; patient admitted to the clinic 2 weeks ago. He has a smaller Wound. Scratch injury from last week revealed. There is large wound over the tibial area. This is smaller. Granulation looks healthy. No need for debridement. 11/04/17; the wound on the left lateral calf looks better. Improved dimensions. Surface of this looks better. We've been maintaining him and Kerlix Coban wraps. He finds this much more comfortable. Silver collagen dressing 11/11/17; left lateral Wound continues to look healthy be making progress. Using a #5 curet I removed removed nonviable skin from the surface of the wound and then necrotic debris from the wound surface. Surface of the wound continues to look healthy. ooHe also has an open area on the left great toenail bed. We've been using topical antibiotics. 11/19/17; left anterior lateral wound continues to look healthy but it's not closed. ooHe also had a small wound above this on the left leg ooInitially traumatic wounds in the setting of significant chronic venous insufficiency and stasis dermatitis 11/25/17; left anterior wounds superiorly is closed still a small wound inferiorly. 12/02/17; left anterior tibial area. Arrives today with adherent callus. Post debridement clearly not completely closed. Hydrofera Blue under 3 layer compression. 12/09/17; left anterior tibia. Circumferential eschar however the wound bed looks  stable to improved. We've been using Hydrofera Blue under 3 layer compression 12/17/17; left anterior tibia. Apparently this was felt to be closed however when the wrap was taken off there is a skin tear to reopen wounds in the same area we've been using Hydrofera Blue under 3 layer compression 12/23/17 left anterior tibia. Not close to close this week apparently the [facility] was stuck to this again. Still circumferential eschar requiring debridement. I put a contact layer on this this time under  the Hydrofera Blue 12/31/17; left anterior tibia. Wound is better slight amount of hyper-granulation. Using Hydrofera Blue over Adaptic. 01/07/18; left anterior tibia. The wound had some surface eschar however after this was removed he has no open wound.he was already revascularized by Dr. [name] when he came to our clinic with atherectomy of the left SFA and popliteal artery. He was also sent to interventional radiology for venous reflux studies. He was not felt to have significant reflux but certainly has chronic venous changes of his skin with hemosiderin deposition around this area. He will definitely need to lubricate his skin and wear compression stocking and I've talked to him about this. READMISSION 05/26/2018 This is a now 70 year old man we cared for with traumatic wounds on his left anterior lower extremity. He had been previously revascularized during that admission by Dr. [name]. Apparently in follow-up Dr. [name] noted that he had deterioration in his arterial status. He underwent a stent placement in the distal left SFA on 04/22/2018. 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 He went back to the angiography suite on 04/30/2018 he underwent PTA and balloon angioplasty of the occluded left mid anterior tibial artery, thrombotic occlusion went from 100 to 0% which reconstitutes the posterior tibial artery. He had thrombectomy and aspiration of the peroneal artery. The stent placed in the distal SFA left SFA was still occluded. 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 He has been recuperating at [place] home in [facility] after the most recent hospitalization. He is going home tomorrow. He tells me that 2 weeks ago he  traumatized the tip of his left fifth toe. He came in urgently for our review of this. This was a history of before I noted that Dr. [name] had already noted dry gangrenous changes of the left fifth toe 06/09/2018; 2-week follow-up. I did contact Dr. [name] after his last appointment and he apparently saw 1 of Dr. Irven Shelling colleagues the next day. He does not follow-up with Dr. [name] himself until Thursday of this week. 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 Nevertheless there is no evidence of infection no drainage and no pain. He had a new area that this week when we were signing him in today on the left anterior mid tibia area, this is in close proximity to the previous wound we have dealt with in this clinic. 06/23/2018; 2-week follow-up. I did not receive a recent note from Dr. [name] to review today. Our office is trying to obtain this. He is apparently not planning to do further vascular interventions and wondered about compression to try and help with the patient's chronic venous insufficiency. However we are also concerned about the arterial flow. ooHe arrives  in clinic today with a new area on the left third toe. The areas on the calf/anterior tibia are close to closing. The left fifth toe is still mummified using Betadine. -In reviewing things with the patient he has what sounds like claudication with mild to moderate amount of activity. 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 I prescribed Levaquin over the phone while we attempted to arrange a plan of care. However the patient called yesterday to report he had low-grade fever and he came in today acutely. There is been a marked deterioration in the left third toe with spreading cellulitis up into the dorsal left foot. He was referred to the emergency room. Readmission: 06/29/2020 patient presents today for reevaluation here in our clinic he was previously treated by Dr. Dellia Nims at the  latter part of 2019 in 2 the beginning of 2020. Subsequently we have not seen him since that time in the interim he did have evaluation with vein and vascular specialist specifically Dr. Anice Paganini who did perform quite extensive work for a left femoral to anterior tibial artery bypass. With that being said in the interim the patient has developed significant lymphedema and has wounds that he tells me have really never healed in regard to the incision site on the left leg. He also has multiple wounds on the feet for various reasons some of which is that he tends to pick at his feet. Fortunately there is no signs of active infection systemically at this time he does have some wounds that are little bit deeper but most are fairly superficial he seems to have good blood flow and overall everything appears to be healthy I see no bone exposed and no obvious signs of osteomyelitis. I do not know that he necessarily needs a x-ray at this point although that something we could consider depending on how things progress. The patient does have a history of lymphedema, diabetes, this is type II, chronic kidney disease stage III, hypertension, and history of peripheral vascular disease. 07/05/2020; patient admitted last week. Is a patient I remember from 2019 he had a spreading infection involving the left foot and we sent him to the hospital. 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 He subsequently had a left femoral to anterior tibial bypass by Dr.[facility] vein and vascular. He also has severe lymphedema with chronic skin changes related to that on the left leg. The most problematic area that was new today was on the left medial great toe. This was apparently a small area last week there was purulent drainage which our intake nurse cultured. Also areas on the left medial foot and heel left lateral foot. He has 2 areas on the left medial calf left lateral calf in the setting of  the severe lymphedema. 07/13/2020 on evaluation today patient appears to be doing better in my opinion compared to his last visit. The good news is there is no signs of active infection systemically and locally I do not see any signs of infection either. He did have an x-ray which was negative that is great news he had a culture which showed MRSA but at the same time he is been on the doxycycline which has helped. I do think we may want to extend this for 7 additional days 1/25; patient admitted to the clinic a few weeks ago. 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  We have been putting him under compression his edema control is a lot better but he is severe verricused skin on the left leg. He is really done quite well he still has an open area on the left medial calf and the left medial first metatarsal head. We have been using silver collagen on the leg silver alginate on the foot Objective Constitutional Sitting or standing Blood Pressure is within target range for patient.. Pulse regular and within target range for patient.Marland Kitchen Respirations regular, non-labored and within target range.. Temperature is normal and within the target range for the patient.Marland Kitchen Appears in no distress. Vitals Time Taken: 8:50 AM, Height: 73 in, Weight: 245 lbs, BMI: 32.3, Temperature: 98.0 F, Pulse: 59 bpm, Respiratory Rate: 18 breaths/min, Blood Pressure: 132/82 mmHg, Capillary Blood Glucose: 105 mg/dl. General Notes: Wound exam; the area on the left leg is still some depth that thick lymphedematous skin around this. No debridement was required ooOn the left first metatarsal head raised thick edges around the wound which I removed with a #3 curette callus skin subcutaneous tissue. Trying to get a healthy margin for epithelialization. No evidence of infection Integumentary (Hair, Skin) Wound #10 status is Open. Original cause of wound was Trauma. The wound is located on the  Left,Anterior Lower Leg. The wound measures 0cm length x 0cm width x 0cm depth; 0cm^2 area and 0cm^3 volume. Wound #12 status is Healed - Epithelialized. Original cause of wound was Trauma. The wound is located on the Left,Proximal,Medial Foot. The wound measures 0cm length x 0cm width x 0cm depth; 0cm^2 area and 0cm^3 volume. Wound #13 status is Open. Original cause of wound was Trauma. The wound is located on the Left,Distal,Medial Foot. The wound measures 1.4cm length x 0.6cm width x 0.2cm depth; 0.66cm^2 area and 0.132cm^3 volume. There is Fat Layer (Subcutaneous Tissue) exposed. There is no tunneling or undermining noted. There is a large amount of purulent drainage noted. The wound margin is distinct with the outline attached to the wound base. There is large (67-100%) pink, pale granulation within the wound bed. There is a small (1-33%) amount of necrotic tissue within the wound bed including Adherent Slough. Wound #14 status is Open. Original cause of wound was Trauma. The wound is located on the Left T Great. The wound measures 0cm length x 0cm width x oe 0cm depth; 0cm^2 area and 0cm^3 volume. Wound #9 status is Open. Original cause of wound was Surgical Injury. The wound is located on the Left,Medial Lower Leg. The wound measures 0.7cm length x 0.3cm width x 0.2cm depth; 0.165cm^2 area and 0.033cm^3 volume. There is Fat Layer (Subcutaneous Tissue) exposed. There is no tunneling or undermining noted. There is a medium amount of serosanguineous drainage noted. The wound margin is distinct with the outline attached to the wound base. There is large (67-100%) red, pink granulation within the wound bed. There is a small (1-33%) amount of necrotic tissue within the wound bed including Adherent Slough. Assessment Active Problems ICD-10 Type 2 diabetes mellitus with foot ulcer Lymphedema, not elsewhere classified Non-pressure chronic ulcer of other part of left foot with fat layer  exposed Other specified peripheral vascular diseases Essential (primary) hypertension Chronic kidney disease, stage 3 unspecified Procedures Wound #13 Pre-procedure diagnosis of Wound #13 is a Diabetic Wound/Ulcer of the Lower Extremity located on the Left,Distal,Medial Foot .Severity of Tissue Pre Debridement is: Fat layer exposed. There was a Excisional Skin/Subcutaneous Tissue Debridement with a total area of 0.84 sq cm performed by Linton Ham  G., MD. With the following instrument(s): Curette to remove Viable and Non-Viable tissue/material. Material removed includes Callus, Subcutaneous Tissue, and Skin: Dermis after achieving pain control using Lidocaine. No specimens were taken. A time out was conducted at 09:27, prior to the start of the procedure. A Minimum amount of bleeding was controlled with Pressure. The procedure was tolerated well with a pain level of 0 throughout and a pain level of 0 following the procedure. Post Debridement Measurements: 1.4cm length x 0.6cm width x 0.2cm depth; 0.132cm^3 volume. Character of Wound/Ulcer Post Debridement is improved. Severity of Tissue Post Debridement is: Fat layer exposed. Post procedure Diagnosis Wound #13: Same as Pre-Procedure Wound #9 Pre-procedure diagnosis of Wound #9 is an Open Surgical Wound located on the Left,Medial Lower Leg . There was a Four Layer Compression Therapy Procedure by Rhae Hammock, RN. Post procedure Diagnosis Wound #9: Same as Pre-Procedure Plan Follow-up Appointments: Return Appointment in 1 week. Bathing/ Shower/ Hygiene: May shower with protection but do not get wound dressing(s) wet. - use cast protector to cover dressing in the shower Edema Control - Lymphedema / SCD / Other: Elevate legs to the level of the heart or above for 30 minutes daily and/or when sitting, a frequency of: - throughout the day Avoid standing for long periods of time. Exercise regularly Moisturize legs daily. - right  leg Compression stocking or Garment 20-30 mm/Hg pressure to: - right leg daily Off-Loading: Other: - healing sandal left foot. WOUND #10: - Lower Leg Wound Laterality: Left, Anterior Cleanser: Soap and Water 1 x Per Week/ Discharge Instructions: May shower and wash wound with dial antibacterial soap and water prior to dressing change. Peri-Wound Care: Triamcinolone 15 (g) 1 x Per Week/ Discharge Instructions: Use triamcinolone 15 (g) mixed with lotion to leg and foot Peri-Wound Care: Sween Lotion (Moisturizing lotion) 1 x Per Week/ Discharge Instructions: Apply moisturizing lotion as directed Prim Dressing: Promogran Prisma Matrix, 4.34 (sq in) (silver collagen) 1 x Per Week/ ary Discharge Instructions: Moisten collagen with saline or hydrogel Secondary Dressing: Woven Gauze Sponge, Non-Sterile 4x4 in 1 x Per Week/ Discharge Instructions: Apply over primary dressing as directed. Secondary Dressing: ABD Pad, 5x9 1 x Per Week/ Discharge Instructions: Apply over primary dressing as directed. Com pression Wrap: FourPress (4 layer compression wrap) 1 x Per Week/ Discharge Instructions: Apply four layer compression , unna layer at top to secure wrap WOUND #12: - Foot Wound Laterality: Left, Medial, Proximal Cleanser: Soap and Water 1 x Per Week/ Discharge Instructions: May shower and wash wound with dial antibacterial soap and water prior to dressing change. Peri-Wound Care: Triamcinolone 15 (g) 1 x Per Week/ Discharge Instructions: Use triamcinolone 15 (g) mixed with lotion to leg and foot Peri-Wound Care: Sween Lotion (Moisturizing lotion) 1 x Per Week/ Discharge Instructions: Apply moisturizing lotion as directed Prim Dressing: KerraCel Ag Gelling Fiber Dressing, 4x5 in (silver alginate) 1 x Per Week/ ary Discharge Instructions: Apply silver alginate to wound bed as instructed Secondary Dressing: Woven Gauze Sponge, Non-Sterile 4x4 in 1 x Per Week/ Discharge Instructions: Apply over  primary dressing as directed. Com pression Wrap: FourPress (4 layer compression wrap) 1 x Per Week/ Discharge Instructions: Apply four layer compression as directed. WOUND #13: - Foot Wound Laterality: Left, Medial, Distal Cleanser: Soap and Water 1 x Per Week/ Discharge Instructions: May shower and wash wound with dial antibacterial soap and water prior to dressing change. Peri-Wound Care: Triamcinolone 15 (g) 1 x Per Week/ Discharge Instructions: Use triamcinolone 15 (g) mixed  with lotion to leg and foot Peri-Wound Care: Sween Lotion (Moisturizing lotion) 1 x Per Week/ Discharge Instructions: Apply moisturizing lotion as directed Prim Dressing: KerraCel Ag Gelling Fiber Dressing, 4x5 in (silver alginate) 1 x Per Week/ ary Discharge Instructions: Apply silver alginate to wound bed as instructed Secondary Dressing: Woven Gauze Sponge, Non-Sterile 4x4 in 1 x Per Week/ Discharge Instructions: Apply over primary dressing as directed. Com pression Wrap: FourPress (4 layer compression wrap) 1 x Per Week/ Discharge Instructions: Apply four layer compression as directed. WOUND #14: - T Great Wound Laterality: Left oe Cleanser: Soap and Water 1 x Per Week/ Discharge Instructions: May shower and wash wound with dial antibacterial soap and water prior to dressing change. Peri-Wound Care: Triamcinolone 15 (g) 1 x Per Week/ Discharge Instructions: Use triamcinolone 15 (g) as directed Peri-Wound Care: Sween Lotion (Moisturizing lotion) 1 x Per Week/ Discharge Instructions: Apply moisturizing lotion as directed Prim Dressing: KerraCel Ag Gelling Fiber Dressing, 4x5 in (silver alginate) 1 x Per Week/ ary Discharge Instructions: Apply silver alginate to wound bed as instructed Secondary Dressing: Woven Gauze Sponge, Non-Sterile 4x4 in 1 x Per Week/ Discharge Instructions: Apply over primary dressing as directed. Com pression Wrap: FourPress (4 layer compression wrap) 1 x Per Week/ Discharge  Instructions: Apply four layer compression as directed. WOUND #9: - Lower Leg Wound Laterality: Left, Medial Cleanser: Soap and Water 1 x Per Week/ Discharge Instructions: May shower and wash wound with dial antibacterial soap and water prior to dressing change. Peri-Wound Care: Triamcinolone 15 (g) 1 x Per Week/ Discharge Instructions: Use triamcinolone 15 (g) mixed with lotion to leg and foot Peri-Wound Care: Sween Lotion (Moisturizing lotion) 1 x Per Week/ Discharge Instructions: Apply moisturizing lotion as directed Prim Dressing: Promogran Prisma Matrix, 4.34 (sq in) (silver collagen) 1 x Per Week/ ary Discharge Instructions: Moisten collagen with saline or hydrogel Secondary Dressing: Woven Gauze Sponge, Non-Sterile 4x4 in 1 x Per Week/ Discharge Instructions: Apply over primary dressing as directed. Secondary Dressing: ABD Pad, 5x9 1 x Per Week/ Discharge Instructions: Apply over primary dressing as directed. Com pression Wrap: FourPress (4 layer compression wrap) 1 x Per Week/ Discharge Instructions: Apply four layer compression , unna layer at top to secure wrap 1. Using collagen on the left leg 2. Silver alginate to the left foot 3. Still under compression/4-layer 4. When these wounds heal he is going to need compression stockings. May be an exfoliating agent like ammonium lactate to try and clean off the thick skin on the left leg Electronic Signature(s) Signed: 07/19/2020 5:14:25 PM By: Linton Ham MD Entered By: Linton Ham on 07/19/2020 09:42:40 -------------------------------------------------------------------------------- SuperBill Details Patient Name: Date of Service: [name]. 07/19/2020 Medical Record Number: [id number] Patient Account Number: [id number] Date of Birth/Sex: Treating RN: 16-Jun-1951 (70 y.o. [name], [name] Primary Care Provider: [name] Other Clinician: Referring Provider: Treating Provider/Extender: Stormy Card in Treatment: 2 Diagnosis Coding ICD-10 Codes Code Description [id number] Type 2 diabetes mellitus with foot ulcer I89.0 Lymphedema, not elsewhere classified L97.522 Non-pressure chronic ulcer of other part of left foot with fat layer exposed I73.89 Other specified peripheral vascular diseases I10 Essential (primary) hypertension N18.30 Chronic kidney disease, stage 3 unspecified Facility Procedures CPT4 Code: [id number] Description: 45364 - DEB SUBQ TISSUE 20 SQ CM/< ICD-10 Diagnosis Description L97.522 Non-pressure chronic ulcer of other part of left foot with fat layer exposed Modifier: Quantity: 1 Physician Procedures : CPT4 Code Description Modifier [id number] - WC PHYS SUBQ TISS  20 SQ CM ICD-10 Diagnosis Description L97.522 Non-pressure chronic ulcer of other part of left foot with fat layer exposed Quantity: 1 Electronic Signature(s) Signed: 07/19/2020 5:14:25 PM By: Linton Ham MD Entered By: Linton Ham on 07/19/2020 09:42:52

## 2020-07-27 ENCOUNTER — Encounter (HOSPITAL_BASED_OUTPATIENT_CLINIC_OR_DEPARTMENT_OTHER): Payer: BC Managed Care – PPO | Attending: Physician Assistant | Admitting: Physician Assistant

## 2020-07-27 ENCOUNTER — Other Ambulatory Visit: Payer: Self-pay

## 2020-07-27 DIAGNOSIS — L84 Corns and callosities: Secondary | ICD-10-CM | POA: Insufficient documentation

## 2020-07-27 DIAGNOSIS — I129 Hypertensive chronic kidney disease with stage 1 through stage 4 chronic kidney disease, or unspecified chronic kidney disease: Secondary | ICD-10-CM | POA: Insufficient documentation

## 2020-07-27 DIAGNOSIS — E11621 Type 2 diabetes mellitus with foot ulcer: Secondary | ICD-10-CM | POA: Insufficient documentation

## 2020-07-27 DIAGNOSIS — I890 Lymphedema, not elsewhere classified: Secondary | ICD-10-CM | POA: Diagnosis not present

## 2020-07-27 DIAGNOSIS — N183 Chronic kidney disease, stage 3 unspecified: Secondary | ICD-10-CM | POA: Diagnosis not present

## 2020-07-27 DIAGNOSIS — E1122 Type 2 diabetes mellitus with diabetic chronic kidney disease: Secondary | ICD-10-CM | POA: Insufficient documentation

## 2020-07-27 DIAGNOSIS — L97529 Non-pressure chronic ulcer of other part of left foot with unspecified severity: Secondary | ICD-10-CM | POA: Diagnosis present

## 2020-07-27 DIAGNOSIS — L97522 Non-pressure chronic ulcer of other part of left foot with fat layer exposed: Secondary | ICD-10-CM | POA: Diagnosis not present

## 2020-07-27 DIAGNOSIS — E1151 Type 2 diabetes mellitus with diabetic peripheral angiopathy without gangrene: Secondary | ICD-10-CM | POA: Diagnosis not present

## 2020-07-27 NOTE — Progress Notes (Addendum)
HICKS, FEICK ([id number]) Visit Report for 07/27/2020 Chief Complaint Document Details Patient Name: Date of Service: [name], [name] 07/27/2020 1:00 PM Medical Record Number: [id number] Patient Account Number: [id number] Date of Birth/Sex: Treating RN: 23-Aug-1950 (70 y.o. [name] Primary Care Provider: [name] Other Clinician: Referring Provider: Treating Provider/Extender: Myles Gip in Treatment: 4 Information Obtained from: Patient Chief Complaint Left leg and foot ulcers Electronic Signature(s) Signed: 07/27/2020 1:20:51 PM By: Worthy Keeler PA-C Entered By: Worthy Keeler on 07/27/2020 13:20:51 -------------------------------------------------------------------------------- Debridement Details Patient Name: Date of Service: [name]. 07/27/2020 1:00 PM Medical Record Number: [id number] Patient Account Number: [id number] Date of Birth/Sex: Treating RN: 02-22-1951 (70 y.o. [name] Primary Care Provider: [name] Other Clinician: Referring Provider: Treating Provider/Extender: Myles Gip in Treatment: 4 Debridement Performed for Assessment: Wound #13 Left,Distal,Medial Foot Performed By: Physician Worthy Keeler, PA Debridement Type: Debridement Severity of Tissue Pre Debridement: Fat layer exposed Level of Consciousness (Pre-procedure): Awake and Alert Pre-procedure Verification/Time Out Yes - 13:55 Taken: Start Time: 13:56 Pain Control: Lidocaine 5% topical ointment T Area Debrided (L x W): otal 4 (cm) x 3 (cm) = 12 (cm) Tissue and other material debrided: Viable, Non-Viable, Callus, Slough, Subcutaneous, Skin: Epidermis, Slough Level: Skin/Subcutaneous Tissue Debridement Description: Excisional Instrument: Curette Bleeding: Minimum Hemostasis Achieved: Pressure End Time: 14:03 Procedural Pain: 1 Post Procedural Pain: 0 Response to Treatment: Procedure was tolerated well Level  of Consciousness (Post- Awake and Alert procedure): Post Debridement Measurements of Total Wound Length: (cm) 0.7 Width: (cm) 0.3 Depth: (cm) 0.1 Volume: (cm) 0.016 Character of Wound/Ulcer Post Debridement: Improved Severity of Tissue Post Debridement: Fat layer exposed Post Procedure Diagnosis Same as Pre-procedure Electronic Signature(s) Signed: 07/27/2020 5:21:21 PM By: Worthy Keeler PA-C Signed: 07/27/2020 5:50:42 PM By: Baruch Gouty RN, BSN Entered By: Baruch Gouty on 07/27/2020 14:00:45 -------------------------------------------------------------------------------- HPI Details Patient Name: Date of Service: [name]. 07/27/2020 1:00 PM Medical Record Number: [id number] Patient Account Number: [id number] Date of Birth/Sex: Treating RN: 11-29-1950 (70 y.o. [name] Primary Care Provider: [name] Other Clinician: Referring Provider: Treating Provider/Extender: Myles Gip in Treatment: 4 History of Present Illness HPI Description: 10/11/17; [name] is a 70 year old man who tells me that in 2015 he slipped down the latter traumatizing his left leg. He developed a wound in the same spot the area that we are currently looking at. He states this closed over for the most part although he always felt it was somewhat unstable. In 2016 he hit the same area with the door of his car had this reopened. He tells me that this is never really closed although sometimes an inflow it remains open on a constant basis. He has not been using any specific dressing to this except for topical antibiotics the nature of which were not really sure. His primary doctor did send him to see Dr. [name] of interventional cardiology. He underwent an angiogram on 08/06/17 and he underwent a PTA and directional atherectomy of the lesser distal SFA and popliteal arteries which resulted in brisk improvement in blood flow. It was noted that he had 2 vessel runoff  through the anterior tibial and peroneal. He is also been to see vascular and interventional radiologist. He was not felt to have any significant superficial venous insufficiency. Presumably is not a candidate for any ablation. It was suggested he come here for wound care. The patient  is a type II diabetic on insulin. He also has a history of venous insufficiency. ABIs on the left were noncompressible in our clinic 10/21/17; patient we admitted to the clinic last week. 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 We put Iodosorb on him after an aggressive debridement and 3 layer compression. He complained of pain in his ankle and itching with is skin in fact he scratched the area on the medial calf superiorly at the rim of our wraps and he has 2 small open areas in that location today which are new. I changed his primary dressing today to silver collagen. As noted he is already had revascularization and does not have any significant superficial venous insufficiency that would be amenable to ablation 10/28/17; patient admitted to the clinic 2 weeks ago. He has a smaller Wound. Scratch injury from last week revealed. There is large wound over the tibial area. This is smaller. Granulation looks healthy. No need for debridement. 11/04/17; the wound on the left lateral calf looks better. Improved dimensions. Surface of this looks better. We've been maintaining him and Kerlix Coban wraps. He finds this much more comfortable. Silver collagen dressing 11/11/17; left lateral Wound continues to look healthy be making progress. Using a #5 curet I removed removed nonviable skin from the surface of the wound and then necrotic debris from the wound surface. Surface of the wound continues to look healthy. He also has an open area on the left great toenail bed. We've been using topical antibiotics. 11/19/17; left anterior lateral wound continues to look  healthy but it's not closed. He also had a small wound above this on the left leg Initially traumatic wounds in the setting of significant chronic venous insufficiency and stasis dermatitis 11/25/17; left anterior wounds superiorly is closed still a small wound inferiorly. 12/02/17; left anterior tibial area. Arrives today with adherent callus. Post debridement clearly not completely closed. Hydrofera Blue under 3 layer compression. 12/09/17; left anterior tibia. Circumferential eschar however the wound bed looks stable to improved. We've been using Hydrofera Blue under 3 layer compression 12/17/17; left anterior tibia. Apparently this was felt to be closed however when the wrap was taken off there is a skin tear to reopen wounds in the same area we've been using Hydrofera Blue under 3 layer compression 12/23/17 left anterior tibia. Not close to close this week apparently the [facility] was stuck to this again. Still circumferential eschar requiring debridement. I put a contact layer on this this time under the Hydrofera Blue 12/31/17; left anterior tibia. Wound is better slight amount of hyper-granulation. Using Hydrofera Blue over Adaptic. 01/07/18; left anterior tibia. The wound had some surface eschar however after this was removed he has no open wound.he was already revascularized by Dr. [name] when he came to our clinic with atherectomy of the left SFA and popliteal artery. He was also sent to interventional radiology for venous reflux studies. He was not felt to have significant reflux but certainly has chronic venous changes of his skin with hemosiderin deposition around this area. He will definitely need to lubricate his skin and wear compression stocking and I've talked to him about this. READMISSION 05/26/2018 This is a now 70 year old man we cared for with traumatic wounds on his left anterior lower extremity. He had been previously revascularized during that admission by Dr. [name]. Apparently  in follow-up Dr. [name] noted that he had deterioration in his arterial status. He  underwent a stent placement in the distal left SFA on 04/22/2018. 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 He went back to the angiography suite on 04/30/2018 he underwent PTA and balloon angioplasty of the occluded left mid anterior tibial artery, thrombotic occlusion went from 100 to 0% which reconstitutes the posterior tibial artery. He had thrombectomy and aspiration of the peroneal artery. The stent placed in the distal SFA left SFA was still occluded. 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 He has been recuperating at [place] [facility] in [facility] after the most recent hospitalization. He is going home tomorrow. He tells me that 2 weeks ago he traumatized the tip of his left fifth toe. He came in urgently for our review of this. This was a history of before I noted that Dr. [name] had already noted dry gangrenous changes of the left fifth toe 06/09/2018; 2-week follow-up. I did contact Dr. [name] after his last appointment and he apparently saw 1 of Dr. Irven Shelling colleagues the next day. He does not follow-up with Dr. [name] himself until Thursday of this week. 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 Nevertheless there is no evidence of infection no drainage and no pain. He had a new area that this week when we were signing him in today on the left anterior mid tibia area, this is in close proximity to the previous wound we have dealt with in this clinic. 06/23/2018; 2-week follow-up. I did not receive a recent note from Dr. [name] to review today. Our office is trying to obtain this. He is apparently not planning to do further  vascular interventions and wondered about compression to try and help with the patient's chronic venous insufficiency. However we are also concerned about the arterial flow. He arrives in clinic today with a new area on the left third toe. The areas on the calf/anterior tibia are close to closing. The left fifth toe is still mummified using Betadine. -In reviewing things with the patient he has what sounds like claudication with mild to moderate amount of activity. 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 I prescribed Levaquin over the phone while we attempted to arrange a plan of care. However the patient called yesterday to report he had low-grade fever and he came in today acutely. There is been a marked deterioration in the left third toe with spreading cellulitis up into the dorsal left foot. He was referred to the emergency room. Readmission: 06/29/2020 patient presents today for reevaluation here in our clinic he was previously treated by Dr. Dellia Nims at the latter part of 2019 in 2 the beginning of 2020. Subsequently we have not seen him since that time in the interim he did have evaluation with vein and vascular specialist specifically Dr. Anice Paganini who did perform quite extensive work for a left femoral to anterior tibial artery bypass. With that being said in the interim the patient has developed significant lymphedema and has wounds that he tells me have really never healed in regard to the incision site on the left leg. He also has multiple wounds on the feet for various reasons some of which is that he tends to pick  at his feet. Fortunately there is no signs of active infection systemically at this time he does have some wounds that are little bit deeper but most are fairly superficial he seems to have good blood flow and overall everything appears to be healthy I see no bone exposed and no obvious signs of osteomyelitis. I do not know that he necessarily  needs a x-ray at this point although that something we could consider depending on how things progress. The patient does have a history of lymphedema, diabetes, this is type II, chronic kidney disease stage III, hypertension, and history of peripheral vascular disease. 07/05/2020; patient admitted last week. Is a patient I remember from 2019 he had a spreading infection involving the left foot and we sent him to the hospital. 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 He subsequently had a left femoral to anterior tibial bypass by Dr.Cain vein and vascular. He also has severe lymphedema with chronic skin changes related to that on the left leg. The most problematic area that was new today was on the left medial great toe. This was apparently a small area last week there was purulent drainage which our intake nurse cultured. Also areas on the left medial foot and heel left lateral foot. He has 2 areas on the left medial calf left lateral calf in the setting of the severe lymphedema. 07/13/2020 on evaluation today patient appears to be doing better in my opinion compared to his last visit. The good news is there is no signs of active infection systemically and locally I do not see any signs of infection either. He did have an x-ray which was negative that is great news he had a culture which showed MRSA but at the same time he is been on the doxycycline which has helped. I do think we may want to extend this for 7 additional days 1/25; patient admitted to the clinic a few weeks ago. 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 We have been putting him under compression his edema control is a lot better but he is severe verricused skin on the left leg. He is really done quite well he still has an open area on the left medial calf and the left medial first metatarsal head. We have been using silver collagen on the leg silver  alginate on the foot 07/27/2020 upon evaluation today patient appears to be doing decently well in regard to his wounds. He still has a lot of dry skin on the left leg. Some of this is starting to peel back and I think he may be able to have them out by removing some that today. Fortunately there is no signs of active infection at this time on the left leg although on the right leg he does appear to have swelling and erythema as well as some mild warmth to touch. This does have been concerned about the possibility of cellulitis although within the differential diagnosis I do think that potentially a DVT has to be at least considered. We need to rule that out before proceeding would just call in the cellulitis. Especially since he is having pain in the posterior aspect of his calf muscle. Electronic Signature(s) Signed: 07/27/2020 2:13:07 PM By: Worthy Keeler PA-C Entered By: Worthy Keeler on 07/27/2020 14:13:04 -------------------------------------------------------------------------------- Physical Exam Details Patient Name: Date of Service: KAYIN, OSMENT 07/27/2020 1:00 PM Medical Record Number: [id number]  Patient Account Number: [id number] Date of Birth/Sex: Treating RN: 1951-05-24 (70 y.o. [name] Primary Care Provider: [name] Other Clinician: Referring Provider: Treating Provider/Extender: Myles Gip in Treatment: 4 Constitutional Well-nourished and well-hydrated in no acute distress. Respiratory normal breathing without difficulty. Psychiatric this patient is able to make decisions and demonstrates good insight into disease process. Alert and Oriented x 3. pleasant and cooperative. Notes Upon inspection patient's wound bed actually showed signs again of good granulation epithelization in regard to left leg. I think is actually doing quite well which is great news. With that being said there does not appear to be any evidence of active  infection at this time on the left leg although the right leg could be cellulitis versus a DVT as the most likely cause of the discoloration and warmth to the leg. Nonetheless I do believe that the patient needs to have a DVT study in order to evaluate and ensure that he does not have a DVT at this point. We are going to send him for that today. Electronic Signature(s) Signed: 07/27/2020 2:13:50 PM By: Worthy Keeler PA-C Entered By: Worthy Keeler on 07/27/2020 14:13:48 -------------------------------------------------------------------------------- Physician Orders Details Patient Name: Date of Service: [name]. 07/27/2020 1:00 PM Medical Record Number: [id number] Patient Account Number: [id number] Date of Birth/Sex: Treating RN: [birth year] (70 y.o. [name] Primary Care Provider: [name] Other Clinician: Referring Provider: Treating Provider/Extender: Myles Gip in Treatment: 4 Verbal / Phone Orders: No Diagnosis Coding ICD-10 Coding Code Description E11.621 Type 2 diabetes mellitus with foot ulcer I89.0 Lymphedema, not elsewhere classified L97.522 Non-pressure chronic ulcer of other part of left foot with fat layer exposed I73.89 Other specified peripheral vascular diseases I10 Essential (primary) hypertension N18.30 Chronic kidney disease, stage 3 unspecified Follow-up Appointments Return Appointment in 1 week. Bathing/ Shower/ Hygiene May shower with protection but do not get wound dressing(s) wet. - use cast protector to cover dressing in the shower Edema Control - Lymphedema / SCD / Other Bilateral Lower Extremities Elevate legs to the level of the heart or above for 30 minutes daily and/or when sitting, a frequency of: - throughout the day Avoid standing for long periods of time. Exercise regularly Moisturize legs daily. - right leg Compression stocking or Garment 20-30 mm/Hg pressure to: - right leg  daily Off-Loading Other: - healing sandal left foot. Wound Treatment Wound #13 - Foot Wound Laterality: Left, Medial, Distal Cleanser: Soap and Water 1 x Per Week Discharge Instructions: May shower and wash wound with dial antibacterial soap and water prior to dressing change. Peri-Wound Care: Triamcinolone 15 (g) 1 x Per Week Discharge Instructions: Use triamcinolone 15 (g) mixed with lotion to leg and foot Peri-Wound Care: Sween Lotion (Moisturizing lotion) 1 x Per Week Discharge Instructions: Apply moisturizing lotion as directed Prim Dressing: KerraCel Ag Gelling Fiber Dressing, 4x5 in (silver alginate) 1 x Per Week ary Discharge Instructions: Apply silver alginate to wound bed as instructed Secondary Dressing: Woven Gauze Sponge, Non-Sterile 4x4 in 1 x Per Week Discharge Instructions: Apply over primary dressing as directed. Compression Wrap: FourPress (4 layer compression wrap) 1 x Per Week Discharge Instructions: Apply four layer compression as directed. Wound #9 - Lower Leg Wound Laterality: Left, Medial Cleanser: Soap and Water 1 x Per Week Discharge Instructions: May shower and wash wound with dial antibacterial soap and water prior to dressing change. Peri-Wound Care: Triamcinolone 15 (g) 1 x Per Week Discharge Instructions:  Use triamcinolone 15 (g) mixed with lotion to leg and foot Peri-Wound Care: Sween Lotion (Moisturizing lotion) 1 x Per Week Discharge Instructions: Apply moisturizing lotion as directed Prim Dressing: KerraCel Ag Gelling Fiber Dressing, 2x2 in (silver alginate) 1 x Per Week ary Discharge Instructions: Apply silver alginate to wound bed as instructed Secondary Dressing: Woven Gauze Sponge, Non-Sterile 4x4 in 1 x Per Week Discharge Instructions: Apply over primary dressing as directed. Compression Wrap: FourPress (4 layer compression wrap) 1 x Per Week Discharge Instructions: Apply four layer compression , unna layer at top to secure wrap Services and  Therapies Venous Duplex Doppler right leg DVT study - * URGENT* right leg edema, redness and pain Patient Medications llergies: No Known Drug Allergies A Notifications Medication Indication Start End 07/27/2020 Bactrim DS DOSE 1 - oral 800 mg-160 mg tablet - 1 tablet oral taken 2 times per day for 14 days Electronic Signature(s) Signed: 07/27/2020 2:15:56 PM By: Worthy Keeler PA-C Entered By: Worthy Keeler on 07/27/2020 14:15:54 Prescription 07/27/2020 -------------------------------------------------------------------------------- [place]ona [name] PA Patient Name: Provider: 1950-09-11 [id number] Date of Birth: NPI#: [id number] Sex: DEA #: [phone] Phone #: License #: [place] Patient Address: [address], [place] [address] [place],  [address] [phone] Allergies No Known Drug Allergies Provider's Orders Venous Duplex Doppler right leg DVT study - * URGENT* right leg edema, redness and pain Hand Signature: Date(s): Electronic Signature(s) Signed: 07/27/2020 5:21:21 PM By: Worthy Keeler PA-C Entered By: Worthy Keeler on 07/27/2020 14:15:56 -------------------------------------------------------------------------------- Problem List Details Patient Name: Date of Service: [name]. 07/27/2020 1:00 PM Medical Record Number: [id number] Patient Account Number: [id number] Date of Birth/Sex: Treating RN: 02/04/1951 (70 y.o. [name] Primary Care Provider: [name] Other Clinician: Referring Provider: Treating Provider/Extender: Myles Gip in Treatment: 4 Active Problems ICD-10 Encounter Code Description Active Date MDM Diagnosis E11.621 Type 2 diabetes mellitus with foot ulcer 06/29/2020 No Yes I89.0 Lymphedema, not elsewhere classified 06/29/2020 No Yes L97.522 Non-pressure chronic ulcer of other part of left foot with  fat layer exposed 06/29/2020 No Yes I73.89 Other specified peripheral vascular diseases 06/29/2020 No Yes I10 Essential (primary) hypertension 06/29/2020 No Yes N18.30 Chronic kidney disease, stage 3 unspecified 06/29/2020 No Yes Inactive Problems ICD-10 Code Description Active Date Inactive Date L97.822 Non-pressure chronic ulcer of other part of left lower leg with fat layer exposed 06/29/2020 06/29/2020 Resolved Problems Electronic Signature(s) Signed: 07/27/2020 1:20:44 PM By: Worthy Keeler PA-C Entered By: Worthy Keeler on 07/27/2020 13:20:43 -------------------------------------------------------------------------------- Progress Note Details Patient Name: Date of Service: [name]. 07/27/2020 1:00 PM Medical Record Number: [id number] Patient Account Number: [id number] Date of Birth/Sex: Treating RN: [birth year] (70 y.o. [name] Primary Care Provider: [name] Other Clinician: Referring Provider: Treating Provider/Extender: Myles Gip in Treatment: 4 Subjective Chief Complaint Information obtained from Patient Left leg and foot ulcers History of Present Illness (HPI) 10/11/17; Mr. Dilks is a 70 year old man who tells me that in 2015 he slipped down the latter traumatizing his left leg. He developed a wound in the same spot the area that we are currently looking at. He states this closed over for the most part although he always felt it was somewhat unstable. In 2016 he hit the same area with the door of his car had this reopened. He tells me that this is never  really closed although sometimes an inflow it remains open on a constant basis. He has not been using any specific dressing to this except for topical antibiotics the nature of which were not really sure. His primary doctor did send him to see Dr. [name] of interventional cardiology. He underwent an angiogram on 08/06/17 and he underwent a PTA and directional atherectomy of the lesser  distal SFA and popliteal arteries which resulted in brisk improvement in blood flow. It was noted that he had 2 vessel runoff through the anterior tibial and peroneal. He is also been to see vascular and interventional radiologist. He was not felt to have any significant superficial venous insufficiency. Presumably is not a candidate for any ablation. It was suggested he come here for wound care. The patient is a type II diabetic on insulin. He also has a history of venous insufficiency. ABIs on the left were noncompressible in our clinic 10/21/17; patient we admitted to the clinic last week. 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 We put Iodosorb on him after an aggressive debridement and 3 layer compression. He complained of pain in his ankle and itching with is skin in fact he scratched the area on the medial calf superiorly at the rim of our wraps and he has 2 small open areas in that location today which are new. I changed his primary dressing today to silver collagen. As noted he is already had revascularization and does not have any significant superficial venous insufficiency that would be amenable to ablation 10/28/17; patient admitted to the clinic 2 weeks ago. He has a smaller Wound. Scratch injury from last week revealed. There is large wound over the tibial area. This is smaller. Granulation looks healthy. No need for debridement. 11/04/17; the wound on the left lateral calf looks better. Improved dimensions. Surface of this looks better. We've been maintaining him and Kerlix Coban wraps. He finds this much more comfortable. Silver collagen dressing 11/11/17; left lateral Wound continues to look healthy be making progress. Using a #5 curet I removed removed nonviable skin from the surface of the wound and then necrotic debris from the wound surface. Surface of the wound continues to look healthy. ooHe also has an open area  on the left great toenail bed. We've been using topical antibiotics. 11/19/17; left anterior lateral wound continues to look healthy but it's not closed. ooHe also had a small wound above this on the left leg ooInitially traumatic wounds in the setting of significant chronic venous insufficiency and stasis dermatitis 11/25/17; left anterior wounds superiorly is closed still a small wound inferiorly. 12/02/17; left anterior tibial area. Arrives today with adherent callus. Post debridement clearly not completely closed. Hydrofera Blue under 3 layer compression. 12/09/17; left anterior tibia. Circumferential eschar however the wound bed looks stable to improved. We've been using Hydrofera Blue under 3 layer compression 12/17/17; left anterior tibia. Apparently this was felt to be closed however when the wrap was taken off there is a skin tear to reopen wounds in the same area we've been using Hydrofera Blue under 3 layer compression 12/23/17 left anterior tibia. Not close to close this week apparently the [facility] was stuck to this again. Still circumferential eschar requiring debridement. I put a contact layer on this this time under the Hydrofera Blue 12/31/17; left anterior tibia. Wound is better slight amount of hyper-granulation. Using Hydrofera Blue over Adaptic. 01/07/18; left anterior tibia. The wound had  some surface eschar however after this was removed he has no open wound.he was already revascularized by Dr. [name] when he came to our clinic with atherectomy of the left SFA and popliteal artery. He was also sent to interventional radiology for venous reflux studies. He was not felt to have significant reflux but certainly has chronic venous changes of his skin with hemosiderin deposition around this area. He will definitely need to lubricate his skin and wear compression stocking and I've talked to him about this. READMISSION 05/26/2018 This is a now 70 year old man we cared for with traumatic  wounds on his left anterior lower extremity. He had been previously revascularized during that admission by Dr. [name]. Apparently in follow-up Dr. [name] noted that he had deterioration in his arterial status. He underwent a stent placement in the distal left SFA on 04/22/2018. 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 He went back to the angiography suite on 04/30/2018 he underwent PTA and balloon angioplasty of the occluded left mid anterior tibial artery, thrombotic occlusion went from 100 to 0% which reconstitutes the posterior tibial artery. He had thrombectomy and aspiration of the peroneal artery. The stent placed in the distal SFA left SFA was still occluded. 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 He has been recuperating at [place] [facility] in [facility] after the most recent hospitalization. He is going home tomorrow. He tells me that 2 weeks ago he traumatized the tip of his left fifth toe. He came in urgently for our review of this. This was a history of before I noted that Dr. [name] had already noted dry gangrenous changes of the left fifth toe 06/09/2018; 2-week follow-up. I did contact Dr. [name] after his last appointment and he apparently saw 1 of Dr. Irven Shelling colleagues the next day. He does not follow-up with Dr. [name] himself until Thursday of this week. 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 Nevertheless there is no evidence of infection no drainage and no pain. He had a new area that this week when we were signing him in today on the left anterior mid tibia area, this is in close proximity to the previous wound we have dealt with in this clinic. 06/23/2018; 2-week follow-up. I did not  receive a recent note from Dr. [name] to review today. Our office is trying to obtain this. He is apparently not planning to do further vascular interventions and wondered about compression to try and help with the patient's chronic venous insufficiency. However we are also concerned about the arterial flow. ooHe arrives in clinic today with a new area on the left third toe. The areas on the calf/anterior tibia are close to closing. The left fifth toe is still mummified using Betadine. -In reviewing things with the patient he has what sounds like claudication with mild to moderate amount of activity. 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 I prescribed Levaquin over the phone while we attempted to arrange a plan of care. However the patient called yesterday to report he had low-grade fever and he came in today acutely. There is been a marked deterioration in the left third toe with spreading cellulitis up into the  dorsal left foot. He was referred to the emergency room. Readmission: 06/29/2020 patient presents today for reevaluation here in our clinic he was previously treated by Dr. Dellia Nims at the latter part of 2019 in 2 the beginning of 2020. Subsequently we have not seen him since that time in the interim he did have evaluation with vein and vascular specialist specifically Dr. Anice Paganini who did perform quite extensive work for a left femoral to anterior tibial artery bypass. With that being said in the interim the patient has developed significant lymphedema and has wounds that he tells me have really never healed in regard to the incision site on the left leg. He also has multiple wounds on the feet for various reasons some of which is that he tends to pick at his feet. Fortunately there is no signs of active infection systemically at this time he does have some wounds that are little bit deeper but most are fairly superficial he seems to have good blood flow  and overall everything appears to be healthy I see no bone exposed and no obvious signs of osteomyelitis. I do not know that he necessarily needs a x-ray at this point although that something we could consider depending on how things progress. The patient does have a history of lymphedema, diabetes, this is type II, chronic kidney disease stage III, hypertension, and history of peripheral vascular disease. 07/05/2020; patient admitted last week. Is a patient I remember from 2019 he had a spreading infection involving the left foot and we sent him to the hospital. 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 He subsequently had a left femoral to anterior tibial bypass by Dr.Cain vein and vascular. He also has severe lymphedema with chronic skin changes related to that on the left leg. The most problematic area that was new today was on the left medial great toe. This was apparently a small area last week there was purulent drainage which our intake nurse cultured. Also areas on the left medial foot and heel left lateral foot. He has 2 areas on the left medial calf left lateral calf in the setting of the severe lymphedema. 07/13/2020 on evaluation today patient appears to be doing better in my opinion compared to his last visit. The good news is there is no signs of active infection systemically and locally I do not see any signs of infection either. He did have an x-ray which was negative that is great news he had a culture which showed MRSA but at the same time he is been on the doxycycline which has helped. I do think we may want to extend this for 7 additional days 1/25; patient admitted to the clinic a few weeks ago. 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 We have been putting him under compression his edema control is a lot better but he is severe verricused skin on the left leg. He is really done quite well he still  has an open area on the left medial calf and the left medial first metatarsal head. We have been using silver collagen on the leg silver alginate on the foot 07/27/2020 upon evaluation today patient appears to be doing decently well in regard to his wounds. He still has a lot of dry skin on the left leg. Some of this is starting to peel back and I think he may be able to have them out by removing  some that today. Fortunately there is no signs of active infection at this time on the left leg although on the right leg he does appear to have swelling and erythema as well as some mild warmth to touch. This does have been concerned about the possibility of cellulitis although within the differential diagnosis I do think that potentially a DVT has to be at least considered. We need to rule that out before proceeding would just call in the cellulitis. Especially since he is having pain in the posterior aspect of his calf muscle. Objective Constitutional Well-nourished and well-hydrated in no acute distress. Vitals Time Taken: 1:15 PM, Height: 73 in, Weight: 245 lbs, BMI: 32.3, Temperature: 97.8 F, Pulse: 82 bpm, Respiratory Rate: 16 breaths/min, Blood Pressure: 153/87 mmHg, Capillary Blood Glucose: 118 mg/dl. Respiratory normal breathing without difficulty. Psychiatric this patient is able to make decisions and demonstrates good insight into disease process. Alert and Oriented x 3. pleasant and cooperative. General Notes: Upon inspection patient's wound bed actually showed signs again of good granulation epithelization in regard to left leg. I think is actually doing quite well which is great news. With that being said there does not appear to be any evidence of active infection at this time on the left leg although the right leg could be cellulitis versus a DVT as the most likely cause of the discoloration and warmth to the leg. Nonetheless I do believe that the patient needs to have a DVT study in  order to evaluate and ensure that he does not have a DVT at this point. We are going to send him for that today. Integumentary (Hair, Skin) Wound #13 status is Open. Original cause of wound was Trauma. The wound is located on the Left,Distal,Medial Foot. The wound measures 0.7cm length x 0.3cm width x 0.1cm depth; 0.165cm^2 area and 0.016cm^3 volume. There is Fat Layer (Subcutaneous Tissue) exposed. There is no tunneling or undermining noted. There is a large amount of serosanguineous drainage noted. The wound margin is distinct with the outline attached to the wound base. There is large (67- 100%) pink, pale granulation within the wound bed. There is a small (1-33%) amount of necrotic tissue within the wound bed including Adherent Slough. General Notes: dry, thick flaky skin to periwound. Wound #9 status is Open. Original cause of wound was Surgical Injury. The wound is located on the Left,Medial Lower Leg. The wound measures 0.3cm length x 0.4cm width x 0.1cm depth; 0.094cm^2 area and 0.009cm^3 volume. Other Condition(s) Patient presents with Rash / Dermatitis located on the Right Leg. General Notes: right leg redden, edematous, warm to touch, with pain 6/10 from foot, leg, knee. Assessment Active Problems ICD-10 Type 2 diabetes mellitus with foot ulcer Lymphedema, not elsewhere classified Non-pressure chronic ulcer of other part of left foot with fat layer exposed Other specified peripheral vascular diseases Essential (primary) hypertension Chronic kidney disease, stage 3 unspecified Procedures Wound #13 Pre-procedure diagnosis of Wound #13 is a Diabetic Wound/Ulcer of the Lower Extremity located on the Left,Distal,Medial Foot .Severity of Tissue Pre Debridement is: Fat layer exposed. There was a Excisional Skin/Subcutaneous Tissue Debridement with a total area of 12 sq cm performed by Worthy Keeler, PA. With the following instrument(s): Curette to remove Viable and Non-Viable  tissue/material. Material removed includes Callus, Subcutaneous Tissue, Slough, and Skin: Epidermis after achieving pain control using Lidocaine 5% topical ointment. No specimens were taken. A time out was conducted at 13:55, prior to the start of the procedure. A Minimum amount  of bleeding was controlled with Pressure. The procedure was tolerated well with a pain level of 1 throughout and a pain level of 0 following the procedure. Post Debridement Measurements: 0.7cm length x 0.3cm width x 0.1cm depth; 0.016cm^3 volume. Character of Wound/Ulcer Post Debridement is improved. Severity of Tissue Post Debridement is: Fat layer exposed. Post procedure Diagnosis Wound #13: Same as Pre-Procedure Wound #9 Pre-procedure diagnosis of Wound #9 is an Open Surgical Wound located on the Left,Medial Lower Leg . There was a Four Layer Compression Therapy Procedure by Rhae Hammock, RN. Post procedure Diagnosis Wound #9: Same as Pre-Procedure Plan Follow-up Appointments: Return Appointment in 1 week. Bathing/ Shower/ Hygiene: May shower with protection but do not get wound dressing(s) wet. - use cast protector to cover dressing in the shower Edema Control - Lymphedema / SCD / Other: Elevate legs to the level of the heart or above for 30 minutes daily and/or when sitting, a frequency of: - throughout the day Avoid standing for long periods of time. Exercise regularly Moisturize legs daily. - right leg Compression stocking or Garment 20-30 mm/Hg pressure to: - right leg daily Off-Loading: Other: - healing sandal left foot. Services and Therapies ordered were: Venous Duplex Doppler right leg DVT study - * URGENT* right leg edema, redness and pain The following medication(s) was prescribed: Bactrim DS oral 800 mg-160 mg tablet 1 1 tablet oral taken 2 times per day for 14 days starting 07/27/2020 WOUND #13: - Foot Wound Laterality: Left, Medial, Distal Cleanser: Soap and Water 1 x Per Week/ Discharge  Instructions: May shower and wash wound with dial antibacterial soap and water prior to dressing change. Peri-Wound Care: Triamcinolone 15 (g) 1 x Per Week/ Discharge Instructions: Use triamcinolone 15 (g) mixed with lotion to leg and foot Peri-Wound Care: Sween Lotion (Moisturizing lotion) 1 x Per Week/ Discharge Instructions: Apply moisturizing lotion as directed Prim Dressing: KerraCel Ag Gelling Fiber Dressing, 4x5 in (silver alginate) 1 x Per Week/ ary Discharge Instructions: Apply silver alginate to wound bed as instructed Secondary Dressing: Woven Gauze Sponge, Non-Sterile 4x4 in 1 x Per Week/ Discharge Instructions: Apply over primary dressing as directed. Com pression Wrap: FourPress (4 layer compression wrap) 1 x Per Week/ Discharge Instructions: Apply four layer compression as directed. WOUND #9: - Lower Leg Wound Laterality: Left, Medial Cleanser: Soap and Water 1 x Per Week/ Discharge Instructions: May shower and wash wound with dial antibacterial soap and water prior to dressing change. Peri-Wound Care: Triamcinolone 15 (g) 1 x Per Week/ Discharge Instructions: Use triamcinolone 15 (g) mixed with lotion to leg and foot Peri-Wound Care: Sween Lotion (Moisturizing lotion) 1 x Per Week/ Discharge Instructions: Apply moisturizing lotion as directed Prim Dressing: KerraCel Ag Gelling Fiber Dressing, 2x2 in (silver alginate) 1 x Per Week/ ary Discharge Instructions: Apply silver alginate to wound bed as instructed Secondary Dressing: Woven Gauze Sponge, Non-Sterile 4x4 in 1 x Per Week/ Discharge Instructions: Apply over primary dressing as directed. Com pression Wrap: FourPress (4 layer compression wrap) 1 x Per Week/ Discharge Instructions: Apply four layer compression , unna layer at top to secure wrap 1. Would recommend currently that we going continue with the wound care measures as before and the patient is in agreement with the plan this includes the use of the silver  alginate dressing which wet shoes at both locations I think both wounds are doing quite well today. 2. Amdinocillin for DVT study in regard to the right lower extremity. I believe this may more likely  be cellulitis though with the posterior calf muscle pain that he is experiencing along with the swelling and erythema/skin changes I believe that it would be prudent of [place] to evaluate and make sure there is no evidence of a blood clot in this right leg. 3. I am going to go ahead and send in a prescription for the patient currently to the pharmacy for Bactrim DS which I think is a good option for him based on what I am seeing today. Again if he does not have a DVT then I think would likely look at a cellulitis type issue here on the right lower extremity. We will see patient back for reevaluation in 1 week here in the clinic. If anything worsens or changes patient will contact our office for additional recommendations. Electronic Signature(s) Signed: 07/27/2020 2:16:30 PM By: Worthy Keeler PA-C Entered By: Worthy Keeler on 07/27/2020 14:16:30 -------------------------------------------------------------------------------- SuperBill Details Patient Name: Date of Service: [name] 07/27/2020 Medical Record Number: [id number] Patient Account Number: [id number] Date of Birth/Sex: Treating RN: September 14, 1950 (70 y.o. [name] Primary Care Provider: [name] Other Clinician: Referring Provider: Treating Provider/Extender: Myles Gip in Treatment: 4 Diagnosis Coding ICD-10 Codes Code Description [phone] Type 2 diabetes mellitus with foot ulcer I89.0 Lymphedema, not elsewhere classified L97.522 Non-pressure chronic ulcer of other part of left foot with fat layer exposed I73.89 Other specified peripheral vascular diseases I10 Essential (primary) hypertension N18.30 Chronic kidney disease, stage 3 unspecified Facility Procedures CPT4 Code: [id number]  1 Description: 8786 - DEB SUBQ TISSUE 20 SQ CM/< ICD-10 Diagnosis Description L97.522 Non-pressure chronic ulcer of other part of left foot with fat layer exposed Modifier: Quantity: 1 Physician Procedures : CPT4 Code Description Modifier [id number] - WC PHYS LEVEL 4 - EST PT 25 ICD-10 Diagnosis Description E11.621 Type 2 diabetes mellitus with foot ulcer I89.0 Lymphedema, not elsewhere classified L97.522 Non-pressure chronic ulcer of other part of left  foot with fat layer exposed I73.89 Other specified peripheral vascular diseases Quantity: 1 : [id number] - WC PHYS SUBQ TISS 20 SQ CM ICD-10 Diagnosis Description L97.522 Non-pressure chronic ulcer of other part of left foot with fat layer exposed Quantity: 1 Electronic Signature(s) Signed: 07/27/2020 2:16:45 PM By: Worthy Keeler PA-C Entered By: Worthy Keeler on 07/27/2020 14:16:44

## 2020-07-28 ENCOUNTER — Other Ambulatory Visit: Payer: Self-pay | Admitting: Physician Assistant

## 2020-07-28 DIAGNOSIS — R52 Pain, unspecified: Secondary | ICD-10-CM

## 2020-07-28 DIAGNOSIS — L539 Erythematous condition, unspecified: Secondary | ICD-10-CM

## 2020-07-28 DIAGNOSIS — R600 Localized edema: Secondary | ICD-10-CM

## 2020-07-29 ENCOUNTER — Ambulatory Visit
Admission: RE | Admit: 2020-07-29 | Discharge: 2020-07-29 | Disposition: A | Discharge status: Home / Self Care | Payer: BC Managed Care – PPO | Source: Ambulatory Visit | Attending: Physician Assistant | Admitting: Physician Assistant

## 2020-07-29 DIAGNOSIS — R600 Localized edema: Secondary | ICD-10-CM

## 2020-07-29 DIAGNOSIS — R52 Pain, unspecified: Secondary | ICD-10-CM

## 2020-07-29 DIAGNOSIS — L539 Erythematous condition, unspecified: Secondary | ICD-10-CM

## 2020-08-01 NOTE — Progress Notes (Signed)
TREYVIN, GLIDDEN ([id number]) Visit Report for 07/27/2020 Arrival Information Details Patient Name: Date of Service: DALON, REICHART 07/27/2020 1:00 PM Medical Record Number: [id number] Patient Account Number: [id number] Date of Birth/Sex: Treating RN: 14-Nov-1950 (70 y.o. [name], [name] Primary Care Mysha Peeler: [name] Other Clinician: Referring Nilda Keathley: Treating Lamont Tant/Extender: Myles Gip in Treatment: 4 Visit Information History Since Last Visit Added or deleted any medications: No Patient Arrived: Ambulatory Any new allergies or adverse reactions: No Arrival Time: 13:12 Had a fall or experienced change in No Accompanied By: self activities of daily living that may affect Transfer Assistance: None risk of falls: Patient Identification Verified: Yes Signs or symptoms of abuse/neglect since last visito No Secondary Verification Process Completed: Yes Hospitalized since last visit: No Patient Requires Transmission-Based No Implantable device outside of the clinic excluding No Precautions: cellular tissue based products placed in the center Patient Has Alerts: Yes since last visit: Patient Alerts: Patient on Blood Thinner Has Dressing in Place as Prescribed: Yes 01/29/20 LABI 1.17 TBI 0.88 Has Compression in Place as Prescribed: Yes 01/29/20 R 1.23 Pain Present Now: Yes Notes Patient c/o pain, burning rash like area to right foot, leg, and knee area. Electronic Signature(s) Signed: 07/27/2020 5:41:58 PM By: Deon Pilling Entered By: Deon Pilling on 07/27/2020 13:15:10 -------------------------------------------------------------------------------- Compression Therapy Details Patient Name: Date of Service: DEMAREON, COLDWELL 07/27/2020 1:00 PM Medical Record Number: [id number] Patient Account Number: [id number] Date of Birth/Sex: Treating RN: 01-27-1951 (70 y.o. [name] Primary Care Lonie Newsham: [name] Other Clinician: Referring  Simonne Boulos: Treating Akash Winski/Extender: Myles Gip in Treatment: 4 Compression Therapy Performed for Wound Assessment: Wound #9 Left,Medial Lower Leg Performed By: Clinician Rhae Hammock, RN Compression Type: Four Layer Post Procedure Diagnosis Same as Pre-procedure Electronic Signature(s) Signed: 07/27/2020 5:50:42 PM By: Baruch Gouty RN, BSN Entered By: Baruch Gouty on 07/27/2020 13:57:00 -------------------------------------------------------------------------------- Encounter Discharge Information Details Patient Name: Date of Service: [name] 07/27/2020 1:00 PM Medical Record Number: [id number] Patient Account Number: [id number] Date of Birth/Sex: Treating RN: March 28, 1951 (70 y.o. [name], [name] Primary Care Selenia Mihok: [name] Other Clinician: Referring Ahja Martello: Treating Richardson Dubree/Extender: Myles Gip in Treatment: 4 Encounter Discharge Information Items Post Procedure Vitals Discharge Condition: Stable Temperature (F): 97.4 Ambulatory Status: Ambulatory Pulse (bpm): 74 Discharge Destination: Home Respiratory Rate (breaths/min): 17 Transportation: Private Auto Blood Pressure (mmHg): 144/74 Accompanied By: self Schedule Follow-up Appointment: Yes Clinical Summary of Care: Patient Declined Electronic Signature(s) Signed: 08/01/2020 9:09:54 AM By: Rhae Hammock RN Entered By: Rhae Hammock on 07/27/2020 17:06:33 -------------------------------------------------------------------------------- Lower Extremity Assessment Details Patient Name: Date of Service: [name] 07/27/2020 1:00 PM Medical Record Number: [id number] Patient Account Number: [id number] Date of Birth/Sex: Treating RN: 10-01-1950 (69 y.o. [name] Primary Care Shantinique Picazo: [name] Other Clinician: Referring Linn Goetze: Treating Velina Drollinger/Extender: Myles Gip in Treatment: 4 Edema  Assessment Assessed: [name]: Yes] [name]: Yes] Edema: [Left: Yes] [Right: Yes] Calf Left: Right: Point of Measurement: 39 cm From Medial Instep 40 cm 41.5 cm Ankle Left: Right: Point of Measurement: 12 cm From Medial Instep 27 cm 26.5 cm Vascular Assessment Pulses: Dorsalis Pedis Palpable: [Right:Yes] Electronic Signature(s) Signed: 07/27/2020 5:41:58 PM By: Deon Pilling Entered By: Deon Pilling on 07/27/2020 13:19:50 -------------------------------------------------------------------------------- Multi-Disciplinary Care Plan Details Patient Name: Date of Service: [name] 07/27/2020 1:00 PM Medical Record Number: [id number] Patient Account Number: [id number] Date of Birth/Sex: Treating RN: [birth year] (69 y.o.  [name] Primary Care Naija Troost: [name] Other Clinician: Referring Demerius Podolak: Treating Tahara Ruffini/Extender: Myles Gip in Treatment: 4 Active Inactive Nutrition Nursing Diagnoses: Impaired glucose control: actual or potential Potential for alteratiion in Nutrition/Potential for imbalanced nutrition Goals: Patient/caregiver agrees to and verbalizes understanding of need to use nutritional supplements and/or vitamins as prescribed Date Initiated: 06/29/2020 Date Inactivated: 07/27/2020 Target Resolution Date: 07/29/2020 Goal Status: Met Patient/caregiver will maintain therapeutic glucose control Date Initiated: 06/29/2020 Target Resolution Date: 08/24/2020 Goal Status: Active Interventions: Assess HgA1c results as ordered upon admission and as needed Assess patient nutrition upon admission and as needed per policy Provide education on elevated blood sugars and impact on wound healing Provide education on nutrition Treatment Activities: Education provided on Nutrition : 06/29/2020 Notes: Venous Leg Ulcer Nursing Diagnoses: Actual venous Insuffiency (use after diagnosis is confirmed) Knowledge deficit related to disease process and  management Goals: Patient will maintain optimal edema control Date Initiated: 06/29/2020 Target Resolution Date: 08/24/2020 Goal Status: Active Patient/caregiver will verbalize understanding of disease process and disease management Date Initiated: 06/29/2020 Date Inactivated: 07/27/2020 Target Resolution Date: 07/29/2020 Goal Status: Met Interventions: Assess peripheral edema status every visit. Compression as ordered Provide education on venous insufficiency Notes: Wound/Skin Impairment Nursing Diagnoses: Impaired tissue integrity Knowledge deficit related to ulceration/compromised skin integrity Goals: Patient/caregiver will verbalize understanding of skin care regimen Date Initiated: 06/29/2020 Target Resolution Date: 08/24/2020 Goal Status: Active Interventions: Assess patient/caregiver ability to obtain necessary supplies Assess patient/caregiver ability to perform ulcer/skin care regimen upon admission and as needed Assess ulceration(s) every visit Provide education on ulcer and skin care Notes: Electronic Signature(s) Signed: 07/27/2020 5:50:42 PM By: Baruch Gouty RN, BSN Entered By: Baruch Gouty on 07/27/2020 13:53:38 -------------------------------------------------------------------------------- Non-Wound Condition Assessment Details Patient Name: Date of Service: [name] 07/27/2020 1:00 PM Medical Record Number: [id number] Patient Account Number: [id number] Date of Birth/Sex: Treating RN: April 11, 1951 (70 y.o. [name] Primary Care Lilianah Buffin: [name] Other Clinician: Referring Danaysia Rader: Treating Giordan Fordham/Extender: Myles Gip in Treatment: 4 Non-Wound Condition: Condition: Rash / Dermatitis Location: Leg Side: Right Notes right leg redden, edematous, warm to touch, with pain 6/10 from foot, leg, knee. Electronic Signature(s) Signed: 07/27/2020 5:41:58 PM By: Deon Pilling Entered By: Deon Pilling on 07/27/2020  13:18:07 -------------------------------------------------------------------------------- Pain Assessment Details Patient Name: Date of Service: TRAYTON, SZABO 07/27/2020 1:00 PM Medical Record Number: [id number] Patient Account Number: [id number] Date of Birth/Sex: Treating RN: April 18, 1951 (70 y.o. [name] Primary Care Kelijah Towry: [name] Other Clinician: Referring Hasina Kreager: Treating Dennis Killilea/Extender: Myles Gip in Treatment: 4 Active Problems Location of Pain Severity and Description of Pain Patient Has Paino Yes Site Locations Pain Location: Pain Location: Generalized Pain Rate the pain. Current Pain Level: 6 Worst Pain Level: 10 Least Pain Level: 0 Tolerable Pain Level: 8 Character of Pain Describe the Pain: Burning, Heavy Pain Management and Medication Current Pain Management: Medication: Yes Cold Application: No Rest: Yes Massage: No Activity: No T.E.N.S.: No Heat Application: No Leg drop or elevation: Yes Is the Current Pain Management Adequate: Adequate How does your wound impact your activities of daily livingo Sleep: Yes Bathing: No Appetite: No Relationship With Others: No Bladder Continence: No Emotions: No Bowel Continence: No Work: Yes Toileting: No Drive: No Dressing: No Hobbies: Yes Electronic Signature(s) Signed: 07/27/2020 5:41:58 PM By: Deon Pilling Entered By: Deon Pilling on 07/27/2020 13:16:03 -------------------------------------------------------------------------------- Patient/Caregiver Education Details Patient Name: Date of Service: [name] 2/2/2022andnbsp1:00  PM Medical Record Number: [id number] Patient Account Number: [id number] Date of Birth/Gender: Treating RN: 08/28/1950 (70 y.o. [name] Primary Care Physician: [name] Other Clinician: Referring Physician: Treating Physician/Extender: Myles Gip in Treatment: 4 Education  Assessment Education Provided To: Patient Education Topics Provided Elevated Blood Sugar/ Impact on Healing: Methods: Explain/Verbal Responses: Reinforcements needed, State content correctly Venous: Methods: Explain/Verbal Responses: Reinforcements needed, State content correctly Electronic Signature(s) Signed: 07/27/2020 5:50:42 PM By: Baruch Gouty RN, BSN Entered By: Baruch Gouty on 07/27/2020 13:54:00 -------------------------------------------------------------------------------- Wound Assessment Details Patient Name: Date of Service: [name] 07/27/2020 1:00 PM Medical Record Number: [id number] Patient Account Number: [id number] Date of Birth/Sex: Treating RN: 1951/04/12 (70 y.o. [name], [name] Primary Care Delena Casebeer: [name] Other Clinician: Referring Aydn Ferrara: Treating Neaveh Belanger/Extender: Myles Gip in Treatment: 4 Wound Status Wound Number: 13 Primary Diabetic Wound/Ulcer of the Lower Extremity Etiology: Wound Location: Left, Distal, Medial Foot Wound Open Wounding Event: Trauma Status: Date Acquired: 05/25/2020 Comorbid Glaucoma, Sleep Apnea, Hypertension, Peripheral Arterial Disease, Weeks Of Treatment: 4 History: Peripheral Venous Disease, Type II Diabetes, Gout, Osteoarthritis, Clustered Wound: No Neuropathy Wound Measurements Length: (cm) 0.7 Width: (cm) 0.3 Depth: (cm) 0.1 Area: (cm) 0.165 Volume: (cm) 0.016 % Reduction in Area: 30.1% % Reduction in Volume: 33.3% Epithelialization: Medium (34-66%) Tunneling: No Undermining: No Wound Description Classification: Grade 2 Wound Margin: Distinct, outline attached Exudate Amount: Large Exudate Type: Serosanguineous Exudate Color: red, brown Foul Odor After Cleansing: No Slough/Fibrino Yes Wound Bed Granulation Amount: Large (67-100%) Exposed Structure Granulation Quality: Pink, Pale Fascia Exposed: No Necrotic Amount: Small (1-33%) Fat Layer (Subcutaneous  Tissue) Exposed: Yes Necrotic Quality: Adherent Slough Tendon Exposed: No Muscle Exposed: No Joint Exposed: No Bone Exposed: No Assessment Notes dry, thick flaky skin to periwound. Treatment Notes Wound #13 (Foot) Wound Laterality: Left, Medial, Distal Cleanser Soap and Water Discharge Instruction: May shower and wash wound with dial antibacterial soap and water prior to dressing change. Peri-Wound Care Triamcinolone 15 (g) Discharge Instruction: Use triamcinolone 15 (g) mixed with lotion to leg and foot Sween Lotion (Moisturizing lotion) Discharge Instruction: Apply moisturizing lotion as directed Topical Primary Dressing KerraCel Ag Gelling Fiber Dressing, 4x5 in (silver alginate) Discharge Instruction: Apply silver alginate to wound bed as instructed Secondary Dressing Woven Gauze Sponge, Non-Sterile 4x4 in Discharge Instruction: Apply over primary dressing as directed. Secured With Compression Wrap FourPress (4 layer compression wrap) Discharge Instruction: Apply four layer compression as directed. Compression Stockings Add-Ons Electronic Signature(s) Signed: 07/27/2020 5:41:58 PM By: Deon Pilling Entered By: Deon Pilling on 07/27/2020 13:25:06 -------------------------------------------------------------------------------- Wound Assessment Details Patient Name: Date of Service: TAYTON, DECAIRE 07/27/2020 1:00 PM Medical Record Number: [id number] Patient Account Number: [id number] Date of Birth/Sex: Treating RN: Oct 29, 1950 (70 y.o. [name] Primary Care Cy Bresee: [name] Other Clinician: Referring Malisha Mabey: Treating Liylah Najarro/Extender: Myles Gip in Treatment: 4 Wound Status Wound Number: 9 Primary Etiology: Open Surgical Wound Wound Location: Left, Medial Lower Leg Secondary Etiology: Lymphedema Wounding Event: Surgical Injury Wound Status: Open Date Acquired: 10/23/2019 Weeks Of Treatment: 4 Clustered Wound: No Wound  Measurements Length: (cm) 0.3 Width: (cm) 0.4 Depth: (cm) 0.1 Area: (cm) 0.094 Volume: (cm) 0.009 % Reduction in Area: 94% % Reduction in Volume: 98.6% Wound Description Classification: Full Thickness Without Exposed Support Structur es Treatment Notes Wound #9 (Lower Leg) Wound Laterality: Left, Medial Cleanser Soap and Water Discharge Instruction: May shower and wash wound with dial antibacterial soap and water  prior to dressing change. Peri-Wound Care Triamcinolone 15 (g) Discharge Instruction: Use triamcinolone 15 (g) mixed with lotion to leg and foot Sween Lotion (Moisturizing lotion) Discharge Instruction: Apply moisturizing lotion as directed Topical Primary Dressing KerraCel Ag Gelling Fiber Dressing, 2x2 in (silver alginate) Discharge Instruction: Apply silver alginate to wound bed as instructed Secondary Dressing Woven Gauze Sponge, Non-Sterile 4x4 in Discharge Instruction: Apply over primary dressing as directed. Secured With Compression Wrap FourPress (4 layer compression wrap) Discharge Instruction: Apply four layer compression , unna layer at top to secure wrap Compression Stockings Add-Ons Electronic Signature(s) Signed: 07/27/2020 5:50:42 PM By: Baruch Gouty RN, BSN Entered By: Baruch Gouty on 07/27/2020 13:52:59 -------------------------------------------------------------------------------- Vitals Details Patient Name: Date of Service: [name] 07/27/2020 1:00 PM Medical Record Number: [id number] Patient Account Number: [id number] Date of Birth/Sex: Treating RN: 1950-09-01 (70 y.o. [name], [name] Primary Care Adib Wahba: [name] Other Clinician: Referring Earlean Fidalgo: Treating Brya Simerly/Extender: Myles Gip in Treatment: 4 Vital Signs Time Taken: 13:15 Temperature (F): 97.8 Height (in): 73 Pulse (bpm): 82 Weight (lbs): 245 Respiratory Rate (breaths/min): 16 Body Mass Index (BMI): 32.3 Blood Pressure  (mmHg): 153/87 Capillary Blood Glucose (mg/dl): 118 Reference Range: 80 - 120 mg / dl Electronic Signature(s) Signed: 07/27/2020 5:41:58 PM By: Deon Pilling Entered By: Deon Pilling on 07/27/2020 13:16:45

## 2020-08-02 ENCOUNTER — Encounter (HOSPITAL_BASED_OUTPATIENT_CLINIC_OR_DEPARTMENT_OTHER): Payer: BC Managed Care – PPO | Admitting: Internal Medicine

## 2020-08-02 ENCOUNTER — Other Ambulatory Visit: Payer: Self-pay

## 2020-08-02 DIAGNOSIS — E11621 Type 2 diabetes mellitus with foot ulcer: Secondary | ICD-10-CM | POA: Diagnosis not present

## 2020-08-02 NOTE — Progress Notes (Signed)
[name], [name] ([id number]) Visit Report for 08/02/2020 Arrival Information Details Patient Name: Date of Service: [name], [name] 08/02/2020 9:30 A M Medical Record Number: [id number] Patient Account Number: [id number] Date of Birth/Sex: Treating RN: [birth year] (70 y.o. [name], [name] Primary Care Nhu Glasby: [name] Other Clinician: Referring Idrissa Beville: Treating Kashmere Staffa/Extender: Stormy Card in Treatment: 4 Visit Information History Since Last Visit Added or deleted any medications: No Patient Arrived: Ambulatory Any new allergies or adverse reactions: No Arrival Time: 10:05 Had a fall or experienced change in No Accompanied By: self activities of daily living that may affect Transfer Assistance: None risk of falls: Patient Identification Verified: Yes Signs or symptoms of abuse/neglect since last visito No Secondary Verification Process Completed: Yes Hospitalized since last visit: No Patient Requires Transmission-Based Precautions: No Implantable device outside of the clinic excluding No Patient Has Alerts: Yes cellular tissue based products placed in the center Patient Alerts: Patient on Blood Thinner since last visit: 01/29/20 LABI 1.17 TBI 0.88 Has Dressing in Place as Prescribed: Yes 01/29/20 R 1.23 Pain Present Now: Yes Electronic Signature(s) Signed: 08/02/2020 5:05:22 PM By: Rhae Hammock RN Entered By: Rhae Hammock on 08/02/2020 10:05:37 -------------------------------------------------------------------------------- Compression Therapy Details Patient Name: Date of Service: [name] Garfinkel. 08/02/2020 9:30 A M Medical Record Number: [id number] Patient Account Number: [id number] Date of Birth/Sex: Treating RN: [birth year] (69 y.o. [name], [name] Primary Care Daphne Karrer: [name] Other Clinician: Referring Ineta Sinning: Treating Duwane Gewirtz/Extender: Stormy Card in Treatment: 4 Compression Therapy Performed  for Wound Assessment: Wound #13 Left,Distal,Medial Foot Performed By: Clinician Rhae Hammock, RN Compression Type: Four Layer Post Procedure Diagnosis Same as Pre-procedure Electronic Signature(s) Signed: 08/02/2020 5:05:22 PM By: Rhae Hammock RN Entered By: Rhae Hammock on 08/02/2020 10:53:24 -------------------------------------------------------------------------------- Compression Therapy Details Patient Name: Date of Service: [name] Garfinkel. 08/02/2020 9:30 A M Medical Record Number: [id number] Patient Account Number: [id number] Date of Birth/Sex: Treating RN: May 29, 1951 (69 y.o. [name], [name] Primary Care Donna Snooks: [name] Other Clinician: Referring Sunaina Ferrando: Treating Nadene Witherspoon/Extender: Stormy Card in Treatment: 4 Compression Therapy Performed for Wound Assessment: Wound #9 Left,Medial Lower Leg Performed By: Clinician Rhae Hammock, RN Compression Type: Four Layer Post Procedure Diagnosis Same as Pre-procedure Electronic Signature(s) Signed: 08/02/2020 5:05:22 PM By: Rhae Hammock RN Entered By: Rhae Hammock on 08/02/2020 10:53:26 -------------------------------------------------------------------------------- Encounter Discharge Information Details Patient Name: Date of Service: [name] Garfinkel. 08/02/2020 9:30 A M Medical Record Number: [id number] Patient Account Number: [id number] Date of Birth/Sex: Treating RN: 1951-03-11 (70 y.o. [name] Primary Care Gabriell Daigneault: [name] Other Clinician: Referring Benford Asch: Treating Shanan Mcmiller/Extender: Stormy Card in Treatment: 4 Encounter Discharge Information Items Discharge Condition: Stable Ambulatory Status: Ambulatory Discharge Destination: Home Transportation: Private Auto Accompanied By: alone Schedule Follow-up Appointment: Yes Clinical Summary of Care: Patient Declined Electronic Signature(s) Signed: 08/02/2020 5:04:56 PM  By: Levan Hurst RN, BSN Entered By: Levan Hurst on 08/02/2020 13:29:39 -------------------------------------------------------------------------------- Lower Extremity Assessment Details Patient Name: Date of Service: [name] Garfinkel. 08/02/2020 9:30 A M Medical Record Number: [id number] Patient Account Number: [id number] Date of Birth/Sex: Treating RN: 10/13/1950 (69 y.o. [name], [name] Primary Care Hartford Maulden: [name] Other Clinician: Referring Ellinore Merced: Treating Teoman Giraud/Extender: Stormy Card in Treatment: 4 Edema Assessment Assessed: [name]: Yes] [name]: No] Edema: [Left: Ye] [Right: s] Calf Left: Right: Point of Measurement: 39 cm From Medial Instep 36.5 cm Ankle Left: Right: Point of Measurement: 12 cm From Medial Instep 24.5 cm  Vascular Assessment Pulses: Dorsalis Pedis Palpable: [Left:Yes] Posterior Tibial Palpable: [Left:Yes] Electronic Signature(s) Signed: 08/02/2020 5:05:22 PM By: Rhae Hammock RN Signed: 08/02/2020 5:13:50 PM By: Baruch Gouty RN, BSN Entered By: Baruch Gouty on 08/02/2020 10:22:52 -------------------------------------------------------------------------------- Multi Wound Chart Details Patient Name: Date of Service: [name] Garfinkel. 08/02/2020 9:30 A M Medical Record Number: [id number] Patient Account Number: [id number] Date of Birth/Sex: Treating RN: 1951/03/12 (70 y.o. [name], [name] Primary Care Mara Favero: [name] Other Clinician: Referring Veera Stapleton: Treating Natalin Bible/Extender: Stormy Card in Treatment: 4 Vital Signs Height(in): 66 Capillary Blood Glucose(mg/dl): 80 Weight(lbs): 245 Pulse(bpm): 35 Body Mass Index(BMI): 11 Blood Pressure(mmHg): 133/74 Temperature(F): 98.3 Respiratory Rate(breaths/min): 17 Photos: [13:No Photos Left, Distal, Medial Foot] [9:No Photos Left, Medial Lower Leg] [N/A:N/A N/A] Wound Location: [13:Trauma] [9:Surgical Injury]  [N/A:N/A] Wounding Event: [13:Diabetic Wound/Ulcer of the Lower] [9:Open Surgical Wound] [N/A:N/A] Primary Etiology: [13:Extremity N/A] [9:Lymphedema] [N/A:N/A] Secondary Etiology: [13:Glaucoma, Sleep Apnea,] [9:Glaucoma, Sleep Apnea,] [N/A:N/A] Comorbid History: [13:Hypertension, Peripheral Arterial Disease, Peripheral Venous Disease, Disease, Peripheral Venous Disease, Type II Diabetes, Gout, Osteoarthritis, Neuropathy 05/25/2020] [9:Hypertension, Peripheral Arterial Neuropathy 10/23/2019]  [N/A:N/A] Date Acquired: [13:4] [9:4] [N/A:N/A] Weeks of Treatment: [13:Open] [9:Open] [N/A:N/A] Wound Status: [13:0.7x0.2x0.1] [9:0.1x0.1x0.1] [N/A:N/A] Measurements L x W x D (cm) [13:0.11] [9:0.008] [N/A:N/A] A (cm) : rea [13:0.011] [9:0.001] [N/A:N/A] Volume (cm) : [13:53.40%] [9:99.50%] [N/A:N/A] % Reduction in Area: [13:54.20%] [9:99.80%] [N/A:N/A] % Reduction in Volume: [13:Grade 2] [9:Full Thickness Without Exposed] [N/A:N/A] Classification: [13:Medium] [9:Support Structures Small] [N/A:N/A] Exudate Amount: [13:Serosanguineous] [9:Serous] [N/A:N/A] Exudate Type: [13:red, brown] [9:amber] [N/A:N/A] Exudate Color: [13:Distinct, outline attached] [9:Flat and Intact] [N/A:N/A] Wound Margin: [13:Large (67-100%)] [9:None Present (0%)] [N/A:N/A] Granulation Amount: [13:Red, Pink] [9:N/A] [N/A:N/A] Granulation Quality: [13:Small (1-33%)] [9:None Present (0%)] [N/A:N/A] Necrotic Amount: [13:Fat Layer (Subcutaneous Tissue): Yes Fascia: No] [N/A:N/A] Exposed Structures: [13:Fascia: No Tendon: No Muscle: No Joint: No Bone: No Small (1-33%)] [9:Fat Layer (Subcutaneous Tissue): No Tendon: No Muscle: No Joint: No Bone: No Large (67-100%)] [N/A:N/A] Epithelialization: [13:Compression Therapy] [9:Compression Therapy] [N/A:N/A] Treatment Notes Electronic Signature(s) Signed: 08/02/2020 4:37:05 PM By: Linton Ham MD Signed: 08/02/2020 5:05:22 PM By: Rhae Hammock RN Entered By: Linton Ham on  08/02/2020 11:17:51 -------------------------------------------------------------------------------- Multi-Disciplinary Care Plan Details Patient Name: Date of Service: [name] Garfinkel. 08/02/2020 9:30 A M Medical Record Number: [id number] Patient Account Number: [id number] Date of Birth/Sex: Treating RN: 04-22-1951 (70 y.o. [name], [name] Primary Care Ronee Ranganathan: [name] Other Clinician: Referring Karis Rilling: Treating Andersyn Fragoso/Extender: Stormy Card in Treatment: 4 Active Inactive Nutrition Nursing Diagnoses: Impaired glucose control: actual or potential Potential for alteratiion in Nutrition/Potential for imbalanced nutrition Goals: Patient/caregiver agrees to and verbalizes understanding of need to use nutritional supplements and/or vitamins as prescribed Date Initiated: 06/29/2020 Date Inactivated: 07/27/2020 Target Resolution Date: 07/29/2020 Goal Status: Met Patient/caregiver will maintain therapeutic glucose control Date Initiated: 06/29/2020 Target Resolution Date: 08/24/2020 Goal Status: Active Interventions: Assess HgA1c results as ordered upon admission and as needed Assess patient nutrition upon admission and as needed per policy Provide education on elevated blood sugars and impact on wound healing Provide education on nutrition Treatment Activities: Education provided on Nutrition : 06/29/2020 Notes: Venous Leg Ulcer Nursing Diagnoses: Actual venous Insuffiency (use after diagnosis is confirmed) Knowledge deficit related to disease process and management Goals: Patient will maintain optimal edema control Date Initiated: 06/29/2020 Target Resolution Date: 08/24/2020 Goal Status: Active Patient/caregiver will verbalize understanding of disease process and disease management Date Initiated: 06/29/2020 Date Inactivated: 07/27/2020 Target Resolution Date: 07/29/2020 Goal Status: Met Interventions: Assess  peripheral edema status every  visit. Compression as ordered Provide education on venous insufficiency Notes: Wound/Skin Impairment Nursing Diagnoses: Impaired tissue integrity Knowledge deficit related to ulceration/compromised skin integrity Goals: Patient/caregiver will verbalize understanding of skin care regimen Date Initiated: 06/29/2020 Target Resolution Date: 08/24/2020 Goal Status: Active Interventions: Assess patient/caregiver ability to obtain necessary supplies Assess patient/caregiver ability to perform ulcer/skin care regimen upon admission and as needed Assess ulceration(s) every visit Provide education on ulcer and skin care Notes: Electronic Signature(s) Signed: 08/02/2020 5:05:22 PM By: Rhae Hammock RN Entered By: Rhae Hammock on 08/02/2020 11:00:05 -------------------------------------------------------------------------------- Pain Assessment Details Patient Name: Date of Service: [name] Garfinkel. 08/02/2020 9:30 A M Medical Record Number: [id number] Patient Account Number: [id number] Date of Birth/Sex: Treating RN: August 18, 1950 (70 y.o. [name] Primary Care Spencer Peterkin: [name] Other Clinician: Referring Nelvin Tomb: Treating Tationna Fullard/Extender: Stormy Card in Treatment: 4 Active Problems Location of Pain Severity and Description of Pain Patient Has Paino Yes Site Locations Pain Location: Pain in Ulcers With Dressing Change: Yes Duration of the Pain. Constant / Intermittento Intermittent Rate the pain. Current Pain Level: 6 Worst Pain Level: 10 Least Pain Level: 0 Tolerable Pain Level: 7 Character of Pain Describe the Pain: Aching Pain Management and Medication Current Pain Management: Medication: Yes Cold Application: No Rest: Yes Massage: No Activity: No T.E.N.S.: No Heat Application: No Leg drop or elevation: No Is the Current Pain Management Adequate: Adequate How does your wound impact your activities of daily livingo Sleep:  No Bathing: No Appetite: No Relationship With Others: No Bladder Continence: No Emotions: No Bowel Continence: No Work: No Toileting: No Drive: No Dressing: No Hobbies: No Electronic Signature(s) Signed: 08/02/2020 5:05:22 PM By: Rhae Hammock RN Entered By: Rhae Hammock on 08/02/2020 10:06:07 -------------------------------------------------------------------------------- Patient/Caregiver Education Details Patient Name: Date of Service: [name], [name] 2/8/2022andnbsp9:30 A M Medical Record Number: [id number] Patient Account Number: [id number] Date of Birth/Gender: Treating RN: [birth year] (69 y.o. [name] Primary Care Physician: [name] Other Clinician: Referring Physician: Treating Physician/Extender: Stormy Card in Treatment: 4 Education Assessment Education Provided To: Patient Education Topics Provided Elevated Blood Sugar/ Impact on Healing: Methods: Explain/Verbal Responses: State content correctly Electronic Signature(s) Signed: 08/02/2020 5:05:22 PM By: Rhae Hammock RN Entered By: Rhae Hammock on 08/02/2020 11:00:51 -------------------------------------------------------------------------------- Wound Assessment Details Patient Name: Date of Service: [name] Garfinkel. 08/02/2020 9:30 A M Medical Record Number: [id number] Patient Account Number: [id number] Date of Birth/Sex: Treating RN: Jun 27, 1950 (69 y.o. [name] Primary Care Arthuro Canelo: [name] Other Clinician: Referring Derron Pipkins: Treating Amyri Frenz/Extender: Stormy Card in Treatment: 4 Wound Status Wound Number: 13 Primary Diabetic Wound/Ulcer of the Lower Extremity Etiology: Wound Location: Left, Distal, Medial Foot Wound Open Wounding Event: Trauma Status: Date Acquired: 05/25/2020 Comorbid Glaucoma, Sleep Apnea, Hypertension, Peripheral Arterial Disease, Weeks Of Treatment: 4 History: Peripheral Venous  Disease, Type II Diabetes, Gout, Osteoarthritis, Clustered Wound: No Neuropathy Wound Measurements Length: (cm) 0.7 Width: (cm) 0.2 Depth: (cm) 0.1 Area: (cm) 0.11 Volume: (cm) 0.011 % Reduction in Area: 53.4% % Reduction in Volume: 54.2% Epithelialization: Small (1-33%) Tunneling: No Undermining: No Wound Description Classification: Grade 2 Wound Margin: Distinct, outline attached Exudate Amount: Medium Exudate Type: Serosanguineous Exudate Color: red, brown Foul Odor After Cleansing: No Slough/Fibrino Yes Wound Bed Granulation Amount: Large (67-100%) Exposed Structure Granulation Quality: Red, Pink Fascia Exposed: No Necrotic Amount: Small (1-33%) Fat Layer (Subcutaneous Tissue) Exposed: Yes Necrotic Quality: Adherent Slough Tendon Exposed: No Muscle Exposed: No  Joint Exposed: No Bone Exposed: No Treatment Notes Wound #13 (Foot) Wound Laterality: Left, Medial, Distal Cleanser Soap and Water Discharge Instruction: May shower and wash wound with dial antibacterial soap and water prior to dressing change. Peri-Wound Care ammonium lactate Discharge Instruction: Apply to periwound areas on left leg Topical Primary Dressing KerraCel Ag Gelling Fiber Dressing, 4x5 in (silver alginate) Discharge Instruction: Apply silver alginate to wound bed as instructed Secondary Dressing Woven Gauze Sponge, Non-Sterile 4x4 in Discharge Instruction: Apply over primary dressing as directed. Secured With Compression Wrap FourPress (4 layer compression wrap) Discharge Instruction: Apply four layer compression as directed. Compression Stockings Add-Ons Electronic Signature(s) Signed: 08/02/2020 5:13:50 PM By: Baruch Gouty RN, BSN Entered By: Baruch Gouty on 08/02/2020 10:27:52 -------------------------------------------------------------------------------- Wound Assessment Details Patient Name: Date of Service: [name] Garfinkel. 08/02/2020 9:30 A M Medical Record Number:  [id number] Patient Account Number: [id number] Date of Birth/Sex: Treating RN: April 04, 1951 (70 y.o. [name] Primary Care Elora Wolter: [name] Other Clinician: Referring Lyndi Holbein: Treating Mandeep Kiser/Extender: Stormy Card in Treatment: 4 Wound Status Wound Number: 9 Primary Open Surgical Wound Etiology: Wound Location: Left, Medial Lower Leg Secondary Lymphedema Wounding Event: Surgical Injury Etiology: Date Acquired: 10/23/2019 Wound Open Weeks Of Treatment: 4 Status: Clustered Wound: No Comorbid Glaucoma, Sleep Apnea, Hypertension, Peripheral Arterial Disease, History: Peripheral Venous Disease, Type II Diabetes, Gout, Osteoarthritis, Neuropathy Wound Measurements Length: (cm) 0.1 Width: (cm) 0.1 Depth: (cm) 0.1 Area: (cm) 0.008 Volume: (cm) 0.001 % Reduction in Area: 99.5% % Reduction in Volume: 99.8% Epithelialization: Large (67-100%) Tunneling: No Undermining: No Wound Description Classification: Full Thickness Without Exposed Support Structures Wound Margin: Flat and Intact Exudate Amount: Small Exudate Type: Serous Exudate Color: amber Foul Odor After Cleansing: No Slough/Fibrino No Wound Bed Granulation Amount: None Present (0%) Exposed Structure Necrotic Amount: None Present (0%) Fascia Exposed: No Fat Layer (Subcutaneous Tissue) Exposed: No Tendon Exposed: No Muscle Exposed: No Joint Exposed: No Bone Exposed: No Treatment Notes Wound #9 (Lower Leg) Wound Laterality: Left, Medial Cleanser Soap and Water Discharge Instruction: May shower and wash wound with dial antibacterial soap and water prior to dressing change. Peri-Wound Care ammonium lactate Discharge Instruction: Apply to periwound areas on left leg Topical Primary Dressing KerraCel Ag Gelling Fiber Dressing, 4x5 in (silver alginate) Discharge Instruction: Apply silver alginate to wound bed as instructed Secondary Dressing Woven Gauze Sponge, Non-Sterile  4x4 in Discharge Instruction: Apply over primary dressing as directed. Secured With Compression Wrap FourPress (4 layer compression wrap) Discharge Instruction: Apply four layer compression as directed. Compression Stockings Add-Ons Electronic Signature(s) Signed: 08/02/2020 5:13:50 PM By: Baruch Gouty RN, BSN Entered By: Baruch Gouty on 08/02/2020 10:28:15 -------------------------------------------------------------------------------- Vitals Details Patient Name: Date of Service: [name] Garfinkel. 08/02/2020 9:30 A M Medical Record Number: [id number] Patient Account Number: [id number] Date of Birth/Sex: Treating RN: Dec 16, 1950 (69 y.o. [name], [name] Primary Care Rebeckah Masih: [name] Other Clinician: Referring Nadeem Romanoski: Treating Lydian Chavous/Extender: Stormy Card in Treatment: 4 Vital Signs Time Taken: 10:08 Temperature (F): 98.3 Height (in): 73 Pulse (bpm): 60 Weight (lbs): 245 Respiratory Rate (breaths/min): 17 Body Mass Index (BMI): 32.3 Blood Pressure (mmHg): 133/74 Capillary Blood Glucose (mg/dl): 80 Reference Range: 80 - 120 mg / dl Electronic Signature(s) Signed: 08/02/2020 5:05:22 PM By: Rhae Hammock RN Entered By: Rhae Hammock on 08/02/2020 10:08:27

## 2020-08-02 NOTE — Progress Notes (Signed)
[name], [name] ([id number]) Visit Report for 08/02/2020 HPI Details Patient Name: Date of Service: [name], [name] 08/02/2020 9:30 A M Medical Record Number: [id number] Patient Account Number: [id number] Date of Birth/Sex: Treating RN: 11-Nov-1950 (70 y.o. [name] Primary Care Provider: Jilda [name] Other Clinician: Referring Provider: Treating Provider/Extender: [name] in Treatment: 4 History of Present Illness HPI Description: 10/11/17; [name] is a 70 year old man who tells me that in 2015 he slipped down the latter traumatizing his left leg. He developed a wound in the same spot the area that we are currently looking at. He states this closed over for the most part although he always felt it was somewhat unstable. In 2016 he hit the same area with the door of his car had this reopened. He tells me that this is never really closed although sometimes an inflow it remains open on a constant basis. He has not been using any specific dressing to this except for topical antibiotics the nature of which were not really sure. His primary doctor did send him to see [name] of interventional cardiology. He underwent an angiogram on 08/06/17 and he underwent a PTA and directional atherectomy of the lesser distal SFA and popliteal arteries which resulted in brisk improvement in blood flow. It was noted that he had 2 vessel runoff through the anterior tibial and peroneal. He is also been to see vascular and interventional radiologist. He was not felt to have any significant superficial venous insufficiency. Presumably is not a candidate for any ablation. It was suggested he come here for wound care. The patient is a type II diabetic on insulin. He also has a history of venous insufficiency. ABIs on the left were noncompressible in our clinic 10/21/17; patient we admitted to the clinic last week. 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 We put Iodosorb on him after an aggressive debridement and 3 layer compression. He complained of pain in his ankle and itching with is skin in fact he scratched the area on the medial calf superiorly at the rim of our wraps and he has 2 small open areas in that location today which are new. I changed his primary dressing today to silver collagen. As noted he is already had revascularization and does not have any significant superficial venous insufficiency that would be amenable to ablation 10/28/17; patient admitted to the clinic 2 weeks ago. He has a smaller Wound. Scratch injury from last week revealed. There is large wound over the tibial area. This is smaller. Granulation looks healthy. No need for debridement. 11/04/17; the wound on the left lateral calf looks better. Improved dimensions. Surface of this looks better. We've been maintaining him and Kerlix Coban wraps. He finds this much more comfortable. Silver collagen dressing 11/11/17; left lateral Wound continues to look healthy be making progress. Using a #5 curet I removed removed nonviable skin from the surface of the wound and then necrotic debris from the wound surface. Surface of the wound continues to look healthy. He also has an open area on the left great toenail bed. We've been using topical antibiotics. 11/19/17; left anterior lateral wound continues to look healthy but it's not closed. He also had a small wound above this on the left leg Initially traumatic wounds in the setting of significant chronic venous insufficiency and stasis dermatitis 11/25/17; left anterior wounds superiorly is closed still a small wound inferiorly.  12/02/17; left anterior tibial area. Arrives today with adherent callus. Post debridement clearly not completely closed. Hydrofera Blue under 3 layer compression. 12/09/17; left anterior tibia. Circumferential eschar however the wound bed looks stable to improved. We've been  using Hydrofera Blue under 3 layer compression 12/17/17; left anterior tibia. Apparently this was felt to be closed however when the wrap was taken off there is a skin tear to reopen wounds in the same area we've been using Hydrofera Blue under 3 layer compression 12/23/17 left anterior tibia. Not close to close this week apparently the [facility] was stuck to this again. Still circumferential eschar requiring debridement. I put a contact layer on this this time under the Hydrofera Blue 12/31/17; left anterior tibia. Wound is better slight amount of hyper-granulation. Using Hydrofera Blue over Adaptic. 01/07/18; left anterior tibia. The wound had some surface eschar however after this was removed he has no open wound.he was already revascularized by [name] when he came to our clinic with atherectomy of the left SFA and popliteal artery. He was also sent to interventional radiology for venous reflux studies. He was not felt to have significant reflux but certainly has chronic venous changes of his skin with hemosiderin deposition around this area. He will definitely need to lubricate his skin and wear compression stocking and I've talked to him about this. READMISSION 05/26/2018 This is a now 70 year old man we cared for with traumatic wounds on his left anterior lower extremity. He had been previously revascularized during that admission by [name]. Apparently in follow-up [name] noted that he had deterioration in his arterial status. He underwent a stent placement in the distal left SFA on 04/22/2018. 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 He went back to the angiography suite on 04/30/2018 he underwent PTA and balloon angioplasty of the occluded left mid anterior tibial artery, thrombotic occlusion went from 100 to 0% which reconstitutes the posterior tibial artery. He had thrombectomy and aspiration of the peroneal artery. The stent placed in the distal SFA left SFA was  still occluded. 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 He has been recuperating at [facility] after the most recent hospitalization. He is going home tomorrow. He tells me that 2 weeks ago he traumatized the tip of his left fifth toe. He came in urgently for our review of this. This was a history of before I noted that [name] had already noted dry gangrenous changes of the left fifth toe 06/09/2018; 2-week follow-up. I did contact [name] after his last appointment and he apparently saw 1 of Dr. Irven Shelling colleagues the next day. He does not follow-up with [name] himself until Thursday of this week. 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 Nevertheless there is no evidence of infection no drainage and no pain. He had a new area that this week when we were signing him in today on the left anterior mid tibia area, this is in close proximity to the previous wound we have dealt with in this clinic. 06/23/2018; 2-week follow-up. I did not receive a recent note from [name] to review today. Our office is trying to obtain this. He is  apparently not planning to do further vascular interventions and wondered about compression to try and help with the patient's chronic venous insufficiency. However we are also concerned about the arterial flow. He arrives in clinic today with a new area on the left third toe. The areas on the calf/anterior tibia are close to closing. The left fifth toe is still mummified using Betadine. -In reviewing things with the patient he has what sounds like claudication with mild to moderate amount of activity. 06/27/2018; x-ray of his foot suggested osteomyelitis of the left third  toe. I prescribed Levaquin over the phone while we attempted to arrange a plan of care. However the patient called yesterday to report he had low-grade fever and he came in today acutely. There is been a marked deterioration in the left third toe with spreading cellulitis up into the dorsal left foot. He was referred to the emergency room. Readmission: 06/29/2020 patient presents today for reevaluation here in our clinic he was previously treated by Dr. Dellia Nims at the latter part of 2019 in 2 the beginning of 2020. Subsequently we have not seen him since that time in the interim he did have evaluation with vein and vascular specialist specifically Dr. Anice Paganini who did perform quite extensive work for a left femoral to anterior tibial artery bypass. With that being said in the interim the patient has developed significant lymphedema and has wounds that he tells me have really never healed in regard to the incision site on the left leg. He also has multiple wounds on the feet for various reasons some of which is that he tends to pick at his feet. Fortunately there is no signs of active infection systemically at this time he does have some wounds that are little bit deeper but most are fairly superficial he seems to have good blood flow and overall everything appears to be healthy I see no bone exposed and no obvious signs of osteomyelitis. I do not know that he necessarily needs a x-ray at this point although that something we could consider depending on how things progress. The patient does have a history of lymphedema, diabetes, this is type II, chronic kidney disease stage III, hypertension, and history of peripheral vascular disease. 07/05/2020; patient admitted last week. Is a patient I remember from 2019 he had a spreading infection involving the left foot and we sent him to the hospital. 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 He subsequently had a left  femoral to anterior tibial bypass by [facility] and vascular. He also has severe lymphedema with chronic skin changes related to that on the left leg. The most problematic area that was new today was on the left medial great toe. This was apparently a small area last week there was purulent drainage which our intake nurse cultured. Also areas on the left medial foot and heel left lateral foot. He has 2 areas on the left medial calf left lateral calf in the setting of the severe lymphedema. 07/13/2020 on evaluation today patient appears to be doing better in my opinion compared to his last visit. The good news is there is no signs of active infection systemically and locally I do not see any signs of infection either. He did have an x-ray which was negative that is great news he had a culture which showed MRSA but at the same time he is been on the doxycycline which has helped. I do think we  may want to extend this for 7 additional days 1/25; patient admitted to the clinic a few weeks ago. 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 We have been putting him under compression his edema control is a lot better but he is severe verricused skin on the left leg. He is really done quite well he still has an open area on the left medial calf and the left medial first metatarsal head. We have been using silver collagen on the leg silver alginate on the foot 07/27/2020 upon evaluation today patient appears to be doing decently well in regard to his wounds. He still has a lot of dry skin on the left leg. Some of this is starting to peel back and I think he may be able to have them out by removing some that today. Fortunately there is no signs of active infection at this time on the left leg although on the right leg he does appear to have swelling and erythema as well as some mild warmth to touch. This does have been concerned about the possibility of cellulitis although  within the differential diagnosis I do think that potentially a DVT has to be at least considered. We need to rule that out before proceeding would just call in the cellulitis. Especially since he is having pain in the posterior aspect of his calf muscle. 2/8; the patient had seen sparingly. He has severe skin changes of chronic lymphedema in the left leg thickened hyperkeratotic verrucous skin. He has an open wound on the medial part of the left first met head left mid tibia. He also has a rim of nonepithelialized skin in the anterior mid tibia. He brought in the AmLactin lotion that was been prescribed although I am not sure under compression and its utility. There concern about cellulitis on the right lower leg the last time he was here. He was put on on antibiotics. His DVT rule out was negative. The right leg looks fine he is using his stocking on this area Electronic Signature(s) Signed: 08/02/2020 4:37:05 PM By: Linton Ham MD Entered By: Linton Ham on 08/02/2020 11:22:37 -------------------------------------------------------------------------------- Physical Exam Details Patient Name: Date of Service: [name]. 08/02/2020 9:30 A M Medical Record Number: [id number] Patient Account Number: [id number] Date of Birth/Sex: Treating RN: Mar 10, 1951 (70 y.o. [name] Primary Care Provider: Jilda [name] Other Clinician: Referring Provider: Treating Provider/Extender: [name] in Treatment: 4 Constitutional Sitting or standing Blood Pressure is within target range for patient.. Pulse regular and within target range for patient.Marland Kitchen Respirations regular, non-labored and within target range.. Temperature is normal and within the target range for the patient.Marland Kitchen Appears in no distress. Notes Wound exam; He has a small area on the medial right first metatarsal head slightly smaller surface of this looks healthy. There are some thick rims of both around  this but I did not debride this. He has an area on the right medial mid tibia this looks healthy as well perhapsMore problematically may be there is a vertical line of nonepithelialized tissue in the anterior tibia. I have often seen this predominantly in similar situations on the ankle and feet I have never seen it in the mid tibia. I vigorously debrided this with Anasept and gauze Electronic Signature(s) Signed: 08/02/2020 4:37:05 PM By: Linton Ham MD Entered By: Linton Ham on 08/02/2020 11:24:41 -------------------------------------------------------------------------------- Physician Orders Details Patient Name: Date of Service: [name]. 08/02/2020 9:30 A  M Medical Record Number: [id number] Patient Account Number: [id number] Date of Birth/Sex: Treating RN: Jan 02, 1951 (70 y.o. [name] Primary Care Provider: Jilda [name] Other Clinician: Referring Provider: Treating Provider/Extender: [name] in Treatment: 4 Verbal / Phone Orders: No Diagnosis Coding Follow-up Appointments Return Appointment in 1 week. Bathing/ Shower/ Hygiene May shower with protection but do not get wound dressing(s) wet. - use cast protector to cover dressing in the shower Edema Control - Lymphedema / SCD / Other Bilateral Lower Extremities Elevate legs to the level of the heart or above for 30 minutes daily and/or when sitting, a frequency of: - throughout the day Avoid standing for long periods of time. Exercise regularly Moisturize legs daily. - right leg Compression stocking or Garment 20-30 mm/Hg pressure to: - right leg daily Off-Loading Other: - healing sandal left foot. Wound Treatment Wound #13 - Foot Wound Laterality: Left, Medial, Distal Cleanser: Soap and Water 1 x Per Week Discharge Instructions: May shower and wash wound with dial antibacterial soap and water prior to dressing change. Peri-Wound Care: ammonium lactate 1 x Per  Week Discharge Instructions: Apply to periwound areas on left leg Prim Dressing: KerraCel Ag Gelling Fiber Dressing, 4x5 in (silver alginate) 1 x Per Week ary Discharge Instructions: Apply silver alginate to wound bed as instructed Secondary Dressing: Woven Gauze Sponge, Non-Sterile 4x4 in 1 x Per Week Discharge Instructions: Apply over primary dressing as directed. Compression Wrap: FourPress (4 layer compression wrap) 1 x Per Week Discharge Instructions: Apply four layer compression as directed. Wound #9 - Lower Leg Wound Laterality: Left, Medial Cleanser: Soap and Water 1 x Per Week Discharge Instructions: May shower and wash wound with dial antibacterial soap and water prior to dressing change. Peri-Wound Care: ammonium lactate 1 x Per Week Discharge Instructions: Apply to periwound areas on left leg Prim Dressing: KerraCel Ag Gelling Fiber Dressing, 4x5 in (silver alginate) 1 x Per Week ary Discharge Instructions: Apply silver alginate to wound bed as instructed Secondary Dressing: Woven Gauze Sponge, Non-Sterile 4x4 in 1 x Per Week Discharge Instructions: Apply over primary dressing as directed. Compression Wrap: FourPress (4 layer compression wrap) 1 x Per Week Discharge Instructions: Apply four layer compression as directed. Electronic Signature(s) Signed: 08/02/2020 4:37:05 PM By: Linton Ham MD Signed: 08/02/2020 5:05:22 PM By: Rhae Hammock RN Entered By: Rhae Hammock on 08/02/2020 10:56:25 -------------------------------------------------------------------------------- Problem List Details Patient Name: Date of Service: [name]. 08/02/2020 9:30 A M Medical Record Number: [id number] Patient Account Number: [id number] Date of Birth/Sex: Treating RN: 10-18-1950 (70 y.o. [name], [name] Primary Care Provider: Jilda [name] Other Clinician: Referring Provider: Treating Provider/Extender: [name] in Treatment: 4 Active  Problems ICD-10 Encounter Code Description Active Date MDM Diagnosis E11.621 Type 2 diabetes mellitus with foot ulcer 06/29/2020 No Yes I89.0 Lymphedema, not elsewhere classified 06/29/2020 No Yes L97.522 Non-pressure chronic ulcer of other part of left foot with fat layer exposed 06/29/2020 No Yes I73.89 Other specified peripheral vascular diseases 06/29/2020 No Yes I10 Essential (primary) hypertension 06/29/2020 No Yes N18.30 Chronic kidney disease, stage 3 unspecified 06/29/2020 No Yes Inactive Problems ICD-10 Code Description Active Date Inactive Date L97.822 Non-pressure chronic ulcer of other part of left lower leg with fat layer exposed 06/29/2020 06/29/2020 Resolved Problems Electronic Signature(s) Signed: 08/02/2020 4:37:05 PM By: Linton Ham MD Entered By: Linton Ham on 08/02/2020 11:17:42 -------------------------------------------------------------------------------- Progress Note Details Patient Name: Date of Service: [name]. 08/02/2020 9:30 A M Medical Record Number: [id number]  Patient Account Number: [id number] Date of Birth/Sex: Treating RN: Mar 31, 1951 (70 y.o. [name] Primary Care Provider: Jilda [name] Other Clinician: Referring Provider: Treating Provider/Extender: [name] in Treatment: 4 Subjective History of Present Illness (HPI) 10/11/17; Mr. Heinlen is a 70 year old man who tells me that in 2015 he slipped down the latter traumatizing his left leg. He developed a wound in the same spot the area that we are currently looking at. He states this closed over for the most part although he always felt it was somewhat unstable. In 2016 he hit the same area with the door of his car had this reopened. He tells me that this is never really closed although sometimes an inflow it remains open on a constant basis. He has not been using any specific dressing to this except for topical antibiotics the nature of which were not really  sure. His primary doctor did send him to see [name] of interventional cardiology. He underwent an angiogram on 08/06/17 and he underwent a PTA and directional atherectomy of the lesser distal SFA and popliteal arteries which resulted in brisk improvement in blood flow. It was noted that he had 2 vessel runoff through the anterior tibial and peroneal. He is also been to see vascular and interventional radiologist. He was not felt to have any significant superficial venous insufficiency. Presumably is not a candidate for any ablation. It was suggested he come here for wound care. The patient is a type II diabetic on insulin. He also has a history of venous insufficiency. ABIs on the left were noncompressible in our clinic 10/21/17; patient we admitted to the clinic last week. 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 We put Iodosorb on him after an aggressive debridement and 3 layer compression. He complained of pain in his ankle and itching with is skin in fact he scratched the area on the medial calf superiorly at the rim of our wraps and he has 2 small open areas in that location today which are new. I changed his primary dressing today to silver collagen. As noted he is already had revascularization and does not have any significant superficial venous insufficiency that would be amenable to ablation 10/28/17; patient admitted to the clinic 2 weeks ago. He has a smaller Wound. Scratch injury from last week revealed. There is large wound over the tibial area. This is smaller. Granulation looks healthy. No need for debridement. 11/04/17; the wound on the left lateral calf looks better. Improved dimensions. Surface of this looks better. We've been maintaining him and Kerlix Coban wraps. He finds this much more comfortable. Silver collagen dressing 11/11/17; left lateral Wound continues to look healthy be making progress. Using a #5 curet I  removed removed nonviable skin from the surface of the wound and then necrotic debris from the wound surface. Surface of the wound continues to look healthy. ooHe also has an open area on the left great toenail bed. We've been using topical antibiotics. 11/19/17; left anterior lateral wound continues to look healthy but it's not closed. ooHe also had a small wound above this on the left leg ooInitially traumatic wounds in the setting of significant chronic venous insufficiency and stasis dermatitis 11/25/17; left anterior wounds superiorly is closed still a small wound inferiorly. 12/02/17; left anterior tibial area. Arrives today with adherent callus. Post debridement clearly not completely closed. Hydrofera Blue under 3 layer compression. 12/09/17; left anterior tibia.  Circumferential eschar however the wound bed looks stable to improved. We've been using Hydrofera Blue under 3 layer compression 12/17/17; left anterior tibia. Apparently this was felt to be closed however when the wrap was taken off there is a skin tear to reopen wounds in the same area we've been using Hydrofera Blue under 3 layer compression 12/23/17 left anterior tibia. Not close to close this week apparently the [facility] was stuck to this again. Still circumferential eschar requiring debridement. I put a contact layer on this this time under the Hydrofera Blue 12/31/17; left anterior tibia. Wound is better slight amount of hyper-granulation. Using Hydrofera Blue over Adaptic. 01/07/18; left anterior tibia. The wound had some surface eschar however after this was removed he has no open wound.he was already revascularized by [name] when he came to our clinic with atherectomy of the left SFA and popliteal artery. He was also sent to interventional radiology for venous reflux studies. He was not felt to have significant reflux but certainly has chronic venous changes of his skin with hemosiderin deposition around this area. He  will definitely need to lubricate his skin and wear compression stocking and I've talked to him about this. READMISSION 05/26/2018 This is a now 70 year old man we cared for with traumatic wounds on his left anterior lower extremity. He had been previously revascularized during that admission by [name]. Apparently in follow-up [name] noted that he had deterioration in his arterial status. He underwent a stent placement in the distal left SFA on 04/22/2018. 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 He went back to the angiography suite on 04/30/2018 he underwent PTA and balloon angioplasty of the occluded left mid anterior tibial artery, thrombotic occlusion went from 100 to 0% which reconstitutes the posterior tibial artery. He had thrombectomy and aspiration of the peroneal artery. The stent placed in the distal SFA left SFA was still occluded. 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 He has been recuperating at [place] [facility] in [facility] after the most recent hospitalization. He is going home tomorrow. He tells me that 2 weeks ago he traumatized the tip of his left fifth toe. He came in urgently for our review of this. This was a history of before I noted that [name] had already noted dry gangrenous changes of the left fifth toe 06/09/2018; 2-week follow-up. I did contact [name] after his last appointment and he apparently saw 1 of Dr. Irven Shelling colleagues the next day. He does not follow-up with [name] himself until Thursday of this week. 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 Nevertheless there is no evidence of infection no drainage and no pain. He had a new area that this week when we  were signing him in today on the left anterior mid tibia area, this is in close proximity to the previous wound we have dealt with in this clinic. 06/23/2018; 2-week follow-up. I did not receive a recent note from [name] to review today. Our office is trying to obtain this. He is apparently not planning to do further vascular interventions and wondered about compression to try and help with the patient's chronic venous insufficiency. However we are also  concerned about the arterial flow. ooHe arrives in clinic today with a new area on the left third toe. The areas on the calf/anterior tibia are close to closing. The left fifth toe is still mummified using Betadine. -In reviewing things with the patient he has what sounds like claudication with mild to moderate amount of activity. 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 I prescribed Levaquin over the phone while we attempted to arrange a plan of care. However the patient called yesterday to report he had low-grade fever and he came in today acutely. There is been a marked deterioration in the left third toe with spreading cellulitis up into the dorsal left foot. He was referred to the emergency room. Readmission: 06/29/2020 patient presents today for reevaluation here in our clinic he was previously treated by Dr. Leanord Hawking at the latter part of 2019 in 2 the beginning of 2020. Subsequently we have not seen him since that time in the interim he did have evaluation with vein and vascular specialist specifically Dr. Bo Mcclintock who did perform quite extensive work for a left femoral to anterior tibial artery bypass. With that being said in the interim the patient has developed significant lymphedema and has wounds that he tells me have really never healed in regard to the incision site on the left leg. He also has multiple wounds on the feet for various reasons some of which is that he tends to pick at his feet.  Fortunately there is no signs of active infection systemically at this time he does have some wounds that are little bit deeper but most are fairly superficial he seems to have good blood flow and overall everything appears to be healthy I see no bone exposed and no obvious signs of osteomyelitis. I do not know that he necessarily needs a x-ray at this point although that something we could consider depending on how things progress. The patient does have a history of lymphedema, diabetes, this is type II, chronic kidney disease stage III, hypertension, and history of peripheral vascular disease. 07/05/2020; patient admitted last week. Is a patient I remember from 2019 he had a spreading infection involving the left foot and we sent him to the hospital. 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 He subsequently had a left femoral to anterior tibial bypass by [facility]. He also has severe lymphedema with chronic skin changes related to that on the left leg. The most problematic area that was new today was on the left medial great toe. This was apparently a small area last week there was purulent drainage which our intake nurse cultured. Also areas on the left medial foot and heel left lateral foot. He has 2 areas on the left medial calf left lateral calf in the setting of the severe lymphedema. 07/13/2020 on evaluation today patient appears to be doing better in my opinion compared to his last visit. The good news is there is no signs of active infection systemically and locally I do not see any signs of infection either. He did have an x-ray which was negative that is great news he had a culture which showed MRSA but at the same time he is been on the doxycycline which has helped. I do think we may want to extend this for 7 additional days 1/25; patient admitted to the clinic a few weeks ago. 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 We have been putting him under compression his edema control is a lot better but he is severe verricused skin on the left leg. He is really done quite well he still has an open area on the left medial calf and the left medial first metatarsal head. We have been using silver collagen on the leg silver alginate on the foot 07/27/2020 upon evaluation today patient appears to be doing decently well in regard to his wounds. He still has a lot of dry skin on the left leg. Some of this is starting to peel back and I think he may be able to have them out by removing some that today. Fortunately there is no signs of active infection at this time on the left leg although on the right leg he does appear to have swelling and erythema as well as some mild warmth to touch. This does have been concerned about the possibility of cellulitis although within the differential diagnosis I do think that potentially a DVT has to be at least considered. We need to rule that out before proceeding would just call in the cellulitis. Especially since he is having pain in the posterior aspect of his calf muscle. 2/8; the patient had seen sparingly. He has severe skin changes of chronic lymphedema in the left leg thickened hyperkeratotic verrucous skin. He has an open wound on the medial part of the left first met head left mid tibia. He also has a rim of nonepithelialized skin in the anterior mid tibia. He brought in the AmLactin lotion that was been prescribed although I am not sure under compression and its utility. There concern about cellulitis on the right lower leg the last time he was here. He was put on on antibiotics. His DVT rule out was negative. The right leg looks fine he is using his stocking on this area Objective Constitutional Sitting or standing Blood Pressure is within target range for patient.. Pulse regular and within target range for patient.Marland Kitchen Respirations regular, non-labored  and within target range.. Temperature is normal and within the target range for the patient.Marland Kitchen Appears in no distress. Vitals Time Taken: 10:08 AM, Height: 73 in, Weight: 245 lbs, BMI: 32.3, Temperature: 98.3 F, Pulse: 60 bpm, Respiratory Rate: 17 breaths/min, Blood Pressure: 133/74 mmHg, Capillary Blood Glucose: 80 mg/dl. General Notes: Wound exam; ooHe has a small area on the medial right first metatarsal head slightly smaller surface of this looks healthy. There are some thick rims of both around this but I did not debride this. ooHe has an area on the right medial mid tibia this looks healthy as well oo perhapsMore problematically may be there is a vertical line of nonepithelialized tissue in the anterior tibia. I have often seen this predominantly in similar situations on the ankle and feet I have never seen it in the mid tibia. I vigorously debrided this with Anasept and gauze Integumentary (Hair, Skin) Wound #13 status is Open. Original cause of wound was Trauma. The wound is located on the Left,Distal,Medial Foot. The wound measures 0.7cm length x 0.2cm width x 0.1cm depth; 0.11cm^2 area and 0.011cm^3 volume. There is Fat Layer (Subcutaneous Tissue) exposed. There is no tunneling or undermining noted. There is a medium amount of serosanguineous drainage noted. The wound margin is distinct with the outline attached to the wound base. There is large (67-100%) red, pink granulation within the wound bed. There is a small (1-33%) amount of necrotic tissue within the wound  bed including Adherent Slough. Wound #9 status is Open. Original cause of wound was Surgical Injury. The wound is located on the Left,Medial Lower Leg. The wound measures 0.1cm length x 0.1cm width x 0.1cm depth; 0.008cm^2 area and 0.001cm^3 volume. There is no tunneling or undermining noted. There is a small amount of serous drainage noted. The wound margin is flat and intact. There is no granulation within the wound bed.  There is no necrotic tissue within the wound bed. Assessment Active Problems ICD-10 Type 2 diabetes mellitus with foot ulcer Lymphedema, not elsewhere classified Non-pressure chronic ulcer of other part of left foot with fat layer exposed Other specified peripheral vascular diseases Essential (primary) hypertension Chronic kidney disease, stage 3 unspecified Procedures Wound #13 Pre-procedure diagnosis of Wound #13 is a Diabetic Wound/Ulcer of the Lower Extremity located on the Left,Distal,Medial Foot . There was a Four Layer Compression Therapy Procedure by Rhae Hammock, RN. Post procedure Diagnosis Wound #13: Same as Pre-Procedure Wound #9 Pre-procedure diagnosis of Wound #9 is an Open Surgical Wound located on the Left,Medial Lower Leg . There was a Four Layer Compression Therapy Procedure by Rhae Hammock, RN. Post procedure Diagnosis Wound #9: Same as Pre-Procedure Plan Follow-up Appointments: Return Appointment in 1 week. Bathing/ Shower/ Hygiene: May shower with protection but do not get wound dressing(s) wet. - use cast protector to cover dressing in the shower Edema Control - Lymphedema / SCD / Other: Elevate legs to the level of the heart or above for 30 minutes daily and/or when sitting, a frequency of: - throughout the day Avoid standing for long periods of time. Exercise regularly Moisturize legs daily. - right leg Compression stocking or Garment 20-30 mm/Hg pressure to: - right leg daily Off-Loading: Other: - healing sandal left foot. WOUND #13: - Foot Wound Laterality: Left, Medial, Distal Cleanser: Soap and Water 1 x Per Week/ Discharge Instructions: May shower and wash wound with dial antibacterial soap and water prior to dressing change. Peri-Wound Care: ammonium lactate 1 x Per Week/ Discharge Instructions: Apply to periwound areas on left leg Prim Dressing: KerraCel Ag Gelling Fiber Dressing, 4x5 in (silver alginate) 1 x Per Week/ ary Discharge  Instructions: Apply silver alginate to wound bed as instructed Secondary Dressing: Woven Gauze Sponge, Non-Sterile 4x4 in 1 x Per Week/ Discharge Instructions: Apply over primary dressing as directed. Com pression Wrap: FourPress (4 layer compression wrap) 1 x Per Week/ Discharge Instructions: Apply four layer compression as directed. WOUND #9: - Lower Leg Wound Laterality: Left, Medial Cleanser: Soap and Water 1 x Per Week/ Discharge Instructions: May shower and wash wound with dial antibacterial soap and water prior to dressing change. Peri-Wound Care: ammonium lactate 1 x Per Week/ Discharge Instructions: Apply to periwound areas on left leg Prim Dressing: KerraCel Ag Gelling Fiber Dressing, 4x5 in (silver alginate) 1 x Per Week/ ary Discharge Instructions: Apply silver alginate to wound bed as instructed Secondary Dressing: Woven Gauze Sponge, Non-Sterile 4x4 in 1 x Per Week/ Discharge Instructions: Apply over primary dressing as directed. Com pression Wrap: FourPress (4 layer compression wrap) 1 x Per Week/ Discharge Instructions: Apply four layer compression as directed. 1. Still using silver alginate 2. Attention to the left medial first met head wound that may need to change from the silver alginate next week if it is not smaller 3. We put silver alginate on the wound on the left anterior wound. I also put silver alginate over the vertical area I described earlier. I do not think this is  epithelialized. 4. I am not sure what use AmLactin will be once under compression although we put this liberally on the "skin" on the left lower extremity. 5. Cellulitis in the right leg is resolved his DVT study was negative Electronic Signature(s) Signed: 08/02/2020 4:37:05 PM By: Linton Ham MD Entered By: Linton Ham on 08/02/2020 11:26:17 -------------------------------------------------------------------------------- SuperBill Details Patient Name: Date of Service: [name].  08/02/2020 Medical Record Number: [id number] Patient Account Number: [id number] Date of Birth/Sex: Treating RN: [birth year] (70 y.o. [name], [name] Primary Care Provider: Jilda [name] Other Clinician: Referring Provider: Treating Provider/Extender: [name] in Treatment: 4 Diagnosis Coding ICD-10 Codes Code Description [phone] Type 2 diabetes mellitus with foot ulcer I89.0 Lymphedema, not elsewhere classified L97.522 Non-pressure chronic ulcer of other part of left foot with fat layer exposed I73.89 Other specified peripheral vascular diseases I10 Essential (primary) hypertension N18.30 Chronic kidney disease, stage 3 unspecified Facility Procedures CPT4 Code: [id number] Description: (Facility Use Only) [phone] - [place] L[facility] LT LEG Modifier: Quantity: 1 Physician Procedures : CPT4 Code Description Modifier [phone] [id number] - WC PHYS LEVEL 4 - EST PT ICD-10 Diagnosis Description E11.621 Type 2 diabetes mellitus with foot ulcer I89.0 Lymphedema, not elsewhere classified L97.522 Non-pressure chronic ulcer of other part of left  foot with fat layer exposed Quantity: 1 Electronic Signature(s) Signed: 08/02/2020 4:37:05 PM By: Linton Ham MD Entered By: Linton Ham on 08/02/2020 11:26:52

## 2020-08-10 ENCOUNTER — Encounter (HOSPITAL_BASED_OUTPATIENT_CLINIC_OR_DEPARTMENT_OTHER): Payer: BC Managed Care – PPO | Admitting: Physician Assistant

## 2020-08-10 ENCOUNTER — Other Ambulatory Visit: Payer: Self-pay

## 2020-08-10 DIAGNOSIS — E11621 Type 2 diabetes mellitus with foot ulcer: Secondary | ICD-10-CM | POA: Diagnosis not present

## 2020-08-10 NOTE — Progress Notes (Addendum)
[name], [name] ([id number]) Visit Report for 08/10/2020 Chief Complaint Document Details Patient Name: Date of Service: [name], [name] 08/10/2020 9:30 A M Medical Record Number: [id number] Patient Account Number: [id number] Date of Birth/Sex: Treating RN: 1950-09-24 (71 y.o. [name] Primary Care Provider: Jilda [name] Other Clinician: Referring Provider: Treating Provider/Extender: [name] in Treatment: 6 Information Obtained from: Patient Chief Complaint Left leg and foot ulcers Electronic Signature(s) Signed: 08/10/2020 9:40:45 AM By: [name] Keeler PA-C Entered By: [name] on 08/10/2020 09:40:45 -------------------------------------------------------------------------------- Debridement Details Patient Name: Date of Service: [name]. 08/10/2020 9:30 A M Medical Record Number: [id number] Patient Account Number: [id number] Date of Birth/Sex: Treating RN: [name], 1952 (70 y.o. [name] Primary Care Provider: Jilda [name] Other Clinician: Referring Provider: Treating Provider/Extender: [name] in Treatment: 6 Debridement Performed for Assessment: Wound #13 Left,Distal,Medial Foot Performed By: Physician [name] Keeler, PA Debridement Type: Debridement Severity of Tissue Pre Debridement: Fat layer exposed Level of Consciousness (Pre-procedure): Awake and Alert Pre-procedure Verification/Time Out Yes - 10:20 Taken: Start Time: 10:[name] Pain Control: Lidocaine 5% topical ointment T Area Debrided (L x W): otal 1.5 (cm) x 1 (cm) = 1.5 (cm) Tissue and other material debrided: Viable, Non-Viable, Callus, Slough, Subcutaneous, Skin: Epidermis, Slough Level: Skin/Subcutaneous Tissue Debridement Description: Excisional Instrument: Curette Bleeding: Minimum Hemostasis Achieved: Pressure End Time: 10:25 Procedural Pain: 0 Post Procedural Pain: 0 Response to Treatment: Procedure was tolerated  well Level of Consciousness (Post- Awake and Alert procedure): Post Debridement Measurements of Total Wound Length: (cm) 0.8 Width: (cm) 0.4 Depth: (cm) 0.1 Volume: (cm) 0.025 Character of Wound/Ulcer Post Debridement: Improved Severity of Tissue Post Debridement: Fat layer exposed Post Procedure Diagnosis Same as Pre-procedure Electronic Signature(s) Signed: 08/10/2020 11:40:16 AM By: [name] Keeler PA-C Signed: 08/10/2020 6:01:36 PM By: [name] Gouty RN, BSN Entered By: [name] on 08/10/2020 10:27:35 -------------------------------------------------------------------------------- HPI Details Patient Name: Date of Service: [name]. 08/10/2020 9:30 A M Medical Record Number: [id number] Patient Account Number: [id number] Date of Birth/Sex: Treating RN: 11-01-1950 (71 y.o. [name] Primary Care Provider: Jilda [name] Other Clinician: Referring Provider: Treating Provider/Extender: [name] in Treatment: 6 History of Present Illness HPI Description: 10/11/17; [name] is a 70 year old man who tells me that in 2015 he slipped down the latter traumatizing his left leg. He developed a wound in the same spot the area that we are currently looking at. He states this closed over for the most part although he always felt it was somewhat unstable. In 2016 he hit the same area with the door of his car had this reopened. He tells me that this is never really closed although sometimes an inflow it remains open on a constant basis. He has not been using any specific dressing to this except for topical antibiotics the nature of which were not really sure. His primary doctor did send him to see Dr. Einar [name] of interventional cardiology. He underwent an angiogram on 08/06/17 and he underwent a PTA and directional atherectomy of the lesser distal SFA and popliteal arteries which resulted in brisk improvement in blood flow. It was noted that he had 2  vessel runoff through the anterior tibial and peroneal. He is also been to see vascular and interventional radiologist. He was not felt to have any significant superficial venous insufficiency. Presumably is not a candidate for any ablation. It was suggested he come here for wound  care. The patient is a type II diabetic on insulin. He also has a history of venous insufficiency. ABIs on the left were noncompressible in our clinic 10/21/17; patient we admitted to the clinic last week. 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 We put Iodosorb on him after an aggressive debridement and 3 layer compression. He complained of pain in his ankle and itching with is skin in fact he scratched the area on the medial calf superiorly at the rim of our wraps and he has 2 small open areas in that location today which are new. I changed his primary dressing today to silver collagen. As noted he is already had revascularization and does not have any significant superficial venous insufficiency that would be amenable to ablation 10/28/17; patient admitted to the clinic 2 weeks ago. He has a smaller Wound. Scratch injury from last week revealed. There is large wound over the tibial area. This is smaller. Granulation looks healthy. No need for debridement. 11/04/17; the wound on the left lateral calf looks better. Improved dimensions. Surface of this looks better. We've been maintaining him and Kerlix Coban wraps. He finds this much more comfortable. Silver collagen dressing 11/11/17; left lateral Wound continues to look healthy be making progress. Using a #5 curet I removed removed nonviable skin from the surface of the wound and then necrotic debris from the wound surface. Surface of the wound continues to look healthy. He also has an open area on the left great toenail bed. We've been using topical antibiotics. 11/19/17; left anterior lateral wound  continues to look healthy but it's not closed. He also had a small wound above this on the left leg Initially traumatic wounds in the setting of significant chronic venous insufficiency and stasis dermatitis 11/25/17; left anterior wounds superiorly is closed still a small wound inferiorly. 12/02/17; left anterior tibial area. Arrives today with adherent callus. Post debridement clearly not completely closed. Hydrofera Blue under 3 layer compression. 12/09/17; left anterior tibia. Circumferential eschar however the wound bed looks stable to improved. We've been using Hydrofera Blue under 3 layer compression 12/17/17; left anterior tibia. Apparently this was felt to be closed however when the wrap was taken off there is a skin tear to reopen wounds in the same area we've been using Hydrofera Blue under 3 layer compression 12/23/17 left anterior tibia. Not close to close this week apparently the [facility] was stuck to this again. Still circumferential eschar requiring debridement. I put a contact layer on this this time under the Hydrofera Blue 12/31/17; left anterior tibia. Wound is better slight amount of hyper-granulation. Using Hydrofera Blue over Adaptic. 01/07/18; left anterior tibia. The wound had some surface eschar however after this was removed he has no open wound.he was already revascularized by Dr. Einar [name] when he came to our clinic with atherectomy of the left SFA and popliteal artery. He was also sent to interventional radiology for venous reflux studies. He was not felt to have significant reflux but certainly has chronic venous changes of his skin with hemosiderin deposition around this area. He will definitely need to lubricate his skin and wear compression stocking and I've talked to him about this. READMISSION 05/26/2018 This is a now 70 year old man we cared for with traumatic wounds on his left anterior lower extremity. He had been previously revascularized during that admission by Dr.  Einar [name]. Apparently in follow-up Dr. Einar [name] noted that he had deterioration in his  arterial status. He underwent a stent placement in the distal left SFA on 04/22/2018. 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 He went back to the angiography suite on 04/30/2018 he underwent PTA and balloon angioplasty of the occluded left mid anterior tibial artery, thrombotic occlusion went from 100 to 0% which reconstitutes the posterior tibial artery. He had thrombectomy and aspiration of the peroneal artery. The stent placed in the distal SFA left SFA was still occluded. 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 He has been recuperating at [place] home in [facility] after the most recent hospitalization. He is going home tomorrow. He tells me that 2 weeks ago he traumatized the tip of his left fifth toe. He came in urgently for our review of this. This was a history of before I noted that Dr. Einar [name] had already noted dry gangrenous changes of the left fifth toe 06/09/2018; 2-week follow-up. I did contact Dr. Einar [name] after his last appointment and he apparently saw 1 of Dr. Irven Shelling colleagues the next day. He does not follow-up with Dr. Einar [name] himself until Thursday of this week. 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 Nevertheless there is no evidence of infection no drainage and no pain. He had a new area that this week when we were signing him in today on the left anterior mid tibia area, this is in close proximity to the previous wound we have dealt with in this clinic. 06/23/2018; 2-week follow-up. I did not receive a recent note from Dr. Einar [name] to review today. Our office is trying to obtain this. He is apparently not  planning to do further vascular interventions and wondered about compression to try and help with the patient's chronic venous insufficiency. However we are also concerned about the arterial flow. He arrives in clinic today with a new area on the left third toe. The areas on the calf/anterior tibia are close to closing. The left fifth toe is still mummified using Betadine. -In reviewing things with the patient he has what sounds like claudication with mild to moderate amount of activity. 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 I prescribed Levaquin over the phone while we attempted to arrange a plan of care. However the patient called yesterday to report he had low-grade fever and he came in today acutely. There is been a marked deterioration in the left third toe with spreading cellulitis up into the dorsal left foot. He was referred to the emergency room. Readmission: 06/29/2020 patient presents today for reevaluation here in our clinic he was previously treated by Dr. Dellia Nims at the latter part of 2019 in 2 the beginning of 2020. Subsequently we have not seen him since that time in the interim he did have evaluation with vein and vascular specialist specifically Dr. Anice Paganini who did perform quite extensive work for a left femoral to anterior tibial artery bypass. With that being said in the interim the patient has developed significant lymphedema and has wounds that he tells me have really never healed in regard to the incision site on the left leg. He also has multiple wounds on the feet for various reasons some of which is that he  tends to pick at his feet. Fortunately there is no signs of active infection systemically at this time he does have some wounds that are little bit deeper but most are fairly superficial he seems to have good blood flow and overall everything appears to be healthy I see no bone exposed and no obvious signs of osteomyelitis. I do not  know that he necessarily needs a x-ray at this point although that something we could consider depending on how things progress. The patient does have a history of lymphedema, diabetes, this is type II, chronic kidney disease stage III, hypertension, and history of peripheral vascular disease. 07/05/2020; patient admitted last week. Is a patient I remember from 2019 he had a spreading infection involving the left foot and we sent him to the hospital. 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 He subsequently had a left femoral to anterior tibial bypass by Dr.Cain vein and vascular. He also has severe lymphedema with chronic skin changes related to that on the left leg. The most problematic area that was new today was on the left medial great toe. This was apparently a small area last week there was purulent drainage which our intake nurse cultured. Also areas on the left medial foot and heel left lateral foot. He has 2 areas on the left medial calf left lateral calf in the setting of the severe lymphedema. 07/13/2020 on evaluation today patient appears to be doing better in my opinion compared to his last visit. The good news is there is no signs of active infection systemically and locally I do not see any signs of infection either. He did have an x-ray which was negative that is great news he had a culture which showed MRSA but at the same time he is been on the doxycycline which has helped. I do think we may want to extend this for 7 additional days 1/25; patient admitted to the clinic a few weeks ago. 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 We have been putting him under compression his edema control is a lot better but he is severe verricused skin on the left leg. He is really done quite well he still has an open area on the left medial calf and the left medial first metatarsal head. We have been using silver  collagen on the leg silver alginate on the foot 07/27/2020 upon evaluation today patient appears to be doing decently well in regard to his wounds. He still has a lot of dry skin on the left leg. Some of this is starting to peel back and I think he may be able to have them out by removing some that today. Fortunately there is no signs of active infection at this time on the left leg although on the right leg he does appear to have swelling and erythema as well as some mild warmth to touch. This does have been concerned about the possibility of cellulitis although within the differential diagnosis I do think that potentially a DVT has to be at least considered. We need to rule that out before proceeding would just call in the cellulitis. Especially since he is having pain in the posterior aspect of his calf muscle. 2/8; the patient had seen sparingly. He has severe skin changes of chronic lymphedema in the left leg thickened hyperkeratotic verrucous skin. He has an open wound on the medial part of the left first met  head left mid tibia. He also has a rim of nonepithelialized skin in the anterior mid tibia. He brought in the AmLactin lotion that was been prescribed although I am not sure under compression and its utility. There concern about cellulitis on the right lower leg the last time he was here. He was put on on antibiotics. His DVT rule out was negative. The right leg looks fine he is using his stocking on this area 08/10/2020 upon evaluation today patient appears to be doing well with regard to his leg currently. He has been tolerating the dressing changes without complication. Fortunately there is no signs of active infection which is great news. Overall very pleased with where things stand. Electronic Signature(s) Signed: 08/10/2020 10:57:02 AM By: [name] Keeler PA-C Entered By: [name] on 08/10/2020  10:57:02 -------------------------------------------------------------------------------- Paring/cutting 1 benign hyperkeratotic lesion Details Patient Name: Date of Service: JADIS, MIKA 08/10/2020 9:30 A M Medical Record Number: [id number] Patient Account Number: [id number] Date of Birth/Sex: Treating RN: 05/12/1951 (70 y.o. [name] Primary Care Provider: Jilda [name] Other Clinician: Referring Provider: Treating Provider/Extender: [name] in Treatment: 6 Procedure Performed for: Non-Wound Location Performed By: Physician [name] Keeler, PA Post Procedure Diagnosis Same as Pre-procedure Notes left plantar foot using #3 curette Electronic Signature(s) Signed: 08/10/2020 11:40:16 AM By: [name] Keeler PA-C Signed: 08/10/2020 6:01:36 PM By: [name] Gouty RN, BSN Entered By: [name] on 08/10/2020 10:28:06 -------------------------------------------------------------------------------- Physical Exam Details Patient Name: Date of Service: [name]. 08/10/2020 9:30 A M Medical Record Number: [id number] Patient Account Number: [id number] Date of Birth/Sex: Treating RN: 1951-05-10 (70 y.o. [name] Primary Care Provider: Jilda [name] Other Clinician: Referring Provider: Treating Provider/Extender: [name] in Treatment: 6 Constitutional Well-nourished and well-hydrated in no acute distress. Respiratory normal breathing without difficulty. Psychiatric this patient is able to make decisions and demonstrates good insight into disease process. Alert and Oriented x 3. pleasant and cooperative. Notes Patient's wounds again are showing signs of good healing the leg in fact is healed to the region on his foot is much smaller he did have some callus I did actually go ahead and remove this today in order to clear away that from the surface of the wound allow this to heal he tolerated that without  complication and post debridement the wound looked to be doing well. Electronic Signature(s) Signed: 08/10/2020 10:57:30 AM By: [name] Keeler PA-C Entered By: [name] on 08/10/2020 10:57:29 -------------------------------------------------------------------------------- Physician Orders Details Patient Name: Date of Service: [name]. 08/10/2020 9:30 A M Medical Record Number: [id number] Patient Account Number: [id number] Date of Birth/Sex: Treating RN: 26-Jul-1950 (70 y.o. [name] Primary Care Provider: Jilda [name] Other Clinician: Referring Provider: Treating Provider/Extender: [name] in Treatment: 6 Verbal / Phone Orders: No Diagnosis Coding ICD-10 Coding Code Description E11.621 Type 2 diabetes mellitus with foot ulcer I89.0 Lymphedema, not elsewhere classified L97.522 Non-pressure chronic ulcer of other part of left foot with fat layer exposed I73.89 Other specified peripheral vascular diseases I10 Essential (primary) hypertension N18.30 Chronic kidney disease, stage 3 unspecified Follow-up Appointments Return Appointment in 1 week. Bathing/ Shower/ Hygiene May shower with protection but do not get wound dressing(s) wet. - use cast protector to cover dressing in the shower Edema Control - Lymphedema / SCD / Other Bilateral Lower Extremities Elevate legs to the level of the heart or above for 30 minutes  daily and/or when sitting, a frequency of: - throughout the day Avoid standing for long periods of time. Exercise regularly Moisturize legs daily. - right leg Compression stocking or Garment 20-30 mm/Hg pressure to: - right leg daily Off-Loading Other: - healing sandal left foot. Wound Treatment Wound #13 - Foot Wound Laterality: Left, Medial, Distal Cleanser: Soap and Water 1 x Per Week Discharge Instructions: May shower and wash wound with dial antibacterial soap and water prior to dressing change. Peri-Wound Care:  ammonium lactate 1 x Per Week Discharge Instructions: Apply to thickened skin on leg Prim Dressing: KerraCel Ag Gelling Fiber Dressing, 2x2 in (silver alginate) 1 x Per Week ary Discharge Instructions: Apply silver alginate to wound bed as instructed Secondary Dressing: Woven Gauze Sponge, Non-Sterile 4x4 in 1 x Per Week Discharge Instructions: Apply over primary dressing as directed. Compression Wrap: FourPress (4 layer compression wrap) 1 x Per Week Discharge Instructions: Apply four layer compression as directed. Electronic Signature(s) Signed: 08/10/2020 11:40:16 AM By: [name] Keeler PA-C Signed: 08/10/2020 6:01:36 PM By: [name] Gouty RN, BSN Entered By: [name] on 08/10/2020 10:30:52 -------------------------------------------------------------------------------- Problem List Details Patient Name: Date of Service: [name]. 08/10/2020 9:30 A M Medical Record Number: [id number] Patient Account Number: [id number] Date of Birth/Sex: Treating RN: [birth year] (70 y.o. [name] Primary Care Provider: Jilda [name] Other Clinician: Referring Provider: Treating Provider/Extender: [name] in Treatment: 6 Active Problems ICD-10 Encounter Code Description Active Date MDM Diagnosis E11.621 Type 2 diabetes mellitus with foot ulcer 06/29/2020 No Yes I89.0 Lymphedema, not elsewhere classified 06/29/2020 No Yes L97.522 Non-pressure chronic ulcer of other part of left foot with fat layer exposed 06/29/2020 No Yes I73.89 Other specified peripheral vascular diseases 06/29/2020 No Yes I10 Essential (primary) hypertension 06/29/2020 No Yes N18.30 Chronic kidney disease, stage 3 unspecified 06/29/2020 No Yes L84 Corns and callosities 08/10/2020 No Yes Inactive Problems ICD-10 Code Description Active Date Inactive Date L97.822 Non-pressure chronic ulcer of other part of left lower leg with fat layer exposed 06/29/2020 06/29/2020 Resolved Problems Electronic  Signature(s) Signed: 08/10/2020 11:40:16 AM By: [name] Keeler PA-C Signed: 08/10/2020 6:01:36 PM By: [name] Gouty RN, BSN Previous Signature: 08/10/2020 9:40:38 AM Version By: [name] Keeler PA-C Entered By: [name] on 08/10/2020 10:32:46 -------------------------------------------------------------------------------- Progress Note Details Patient Name: Date of Service: [name]. 08/10/2020 9:30 A M Medical Record Number: [id number] Patient Account Number: [id number] Date of Birth/Sex: Treating RN: February 21, 1951 (70 y.o. [name] Primary Care Provider: Jilda [name] Other Clinician: Referring Provider: Treating Provider/Extender: [name] in Treatment: 6 Subjective Chief Complaint Information obtained from Patient Left leg and foot ulcers History of Present Illness (HPI) 10/11/17; Mr. Elena is a 70 year old man who tells me that in 2015 he slipped down the latter traumatizing his left leg. He developed a wound in the same spot the area that we are currently looking at. He states this closed over for the most part although he always felt it was somewhat unstable. In 2016 he hit the same area with the door of his car had this reopened. He tells me that this is never really closed although sometimes an inflow it remains open on a constant basis. He has not been using any specific dressing to this except for topical antibiotics the nature of which were not really sure. His primary doctor did send him to see Dr. Einar [name] of interventional cardiology. He underwent an angiogram on 08/06/17 and he underwent  a PTA and directional atherectomy of the lesser distal SFA and popliteal arteries which resulted in brisk improvement in blood flow. It was noted that he had 2 vessel runoff through the anterior tibial and peroneal. He is also been to see vascular and interventional radiologist. He was not felt to have any significant superficial venous insufficiency.  Presumably is not a candidate for any ablation. It was suggested he come here for wound care. The patient is a type II diabetic on insulin. He also has a history of venous insufficiency. ABIs on the left were noncompressible in our clinic 10/21/17; patient we admitted to the clinic last week. 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 We put Iodosorb on him after an aggressive debridement and 3 layer compression. He complained of pain in his ankle and itching with is skin in fact he scratched the area on the medial calf superiorly at the rim of our wraps and he has 2 small open areas in that location today which are new. I changed his primary dressing today to silver collagen. As noted he is already had revascularization and does not have any significant superficial venous insufficiency that would be amenable to ablation 10/28/17; patient admitted to the clinic 2 weeks ago. He has a smaller Wound. Scratch injury from last week revealed. There is large wound over the tibial area. This is smaller. Granulation looks healthy. No need for debridement. 11/04/17; the wound on the left lateral calf looks better. Improved dimensions. Surface of this looks better. We've been maintaining him and Kerlix Coban wraps. He finds this much more comfortable. Silver collagen dressing 11/11/17; left lateral Wound continues to look healthy be making progress. Using a #5 curet I removed removed nonviable skin from the surface of the wound and then necrotic debris from the wound surface. Surface of the wound continues to look healthy. ooHe also has an open area on the left great toenail bed. We've been using topical antibiotics. 11/19/17; left anterior lateral wound continues to look healthy but it's not closed. ooHe also had a small wound above this on the left leg ooInitially traumatic wounds in the setting of significant chronic venous insufficiency and  stasis dermatitis 11/25/17; left anterior wounds superiorly is closed still a small wound inferiorly. 12/02/17; left anterior tibial area. Arrives today with adherent callus. Post debridement clearly not completely closed. Hydrofera Blue under 3 layer compression. 12/09/17; left anterior tibia. Circumferential eschar however the wound bed looks stable to improved. We've been using Hydrofera Blue under 3 layer compression 12/17/17; left anterior tibia. Apparently this was felt to be closed however when the wrap was taken off there is a skin tear to reopen wounds in the same area we've been using Hydrofera Blue under 3 layer compression 12/23/17 left anterior tibia. Not close to close this week apparently the [facility] was stuck to this again. Still circumferential eschar requiring debridement. I put a contact layer on this this time under the Hydrofera Blue 12/31/17; left anterior tibia. Wound is better slight amount of hyper-granulation. Using Hydrofera Blue over Adaptic. 01/07/18; left anterior tibia. The wound had some surface eschar however after this was removed he has no open wound.he was already revascularized by Dr. Einar [name] when he came to our clinic with atherectomy of the left SFA and popliteal artery. He was also sent to interventional radiology for venous reflux studies. He was not felt to have significant reflux but certainly has chronic  venous changes of his skin with hemosiderin deposition around this area. He will definitely need to lubricate his skin and wear compression stocking and I've talked to him about this. READMISSION 05/26/2018 This is a now 70 year old man we cared for with traumatic wounds on his left anterior lower extremity. He had been previously revascularized during that admission by Dr. Einar [name]. Apparently in follow-up Dr. Einar [name] noted that he had deterioration in his arterial status. He underwent a stent placement in the distal left SFA on 04/22/2018. 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 He went back to the angiography suite on 04/30/2018 he underwent PTA and balloon angioplasty of the occluded left mid anterior tibial artery, thrombotic occlusion went from 100 to 0% which reconstitutes the posterior tibial artery. He had thrombectomy and aspiration of the peroneal artery. The stent placed in the distal SFA left SFA was still occluded. 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 He has been recuperating at [place] home in [facility] after the most recent hospitalization. He is going home tomorrow. He tells me that 2 weeks ago he traumatized the tip of his left fifth toe. He came in urgently for our review of this. This was a history of before I noted that Dr. Einar [name] had already noted dry gangrenous changes of the left fifth toe 06/09/2018; 2-week follow-up. I did contact Dr. Einar [name] after his last appointment and he apparently saw 1 of Dr. Irven Shelling colleagues the next day. He does not follow-up with Dr. Einar [name] himself until Thursday of this week. 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 Nevertheless there is no evidence of infection no drainage and no pain. He had a new area that this week when we were signing him in today on the left anterior mid tibia area, this is in close proximity to the previous wound we have dealt with in this clinic. 06/23/2018; 2-week follow-up. I did not receive a recent note from Dr. Einar [name] to review today. Our office is trying to obtain this. He is apparently not planning to do further vascular interventions and wondered about compression to try and help with the patient's chronic venous insufficiency. However we are also concerned about the arterial  flow. ooHe arrives in clinic today with a new area on the left third toe. The areas on the calf/anterior tibia are close to closing. The left fifth toe is still mummified using Betadine. -In reviewing things with the patient he has what sounds like claudication with mild to moderate amount of activity. 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 I prescribed Levaquin over the phone while we attempted to arrange a plan of care. However the patient called yesterday to report he had low-grade fever and he came in today acutely. There is been a marked deterioration in the left third toe with spreading cellulitis up into the dorsal left foot. He was referred to the emergency room. Readmission: 06/29/2020 patient presents today for reevaluation here in our clinic he was previously treated by Dr. Dellia Nims at the latter part of 2019 in 2 the beginning of 2020. Subsequently we have not seen him since that time in the interim he did have evaluation with  vein and vascular specialist specifically Dr. Anice Paganini who did perform quite extensive work for a left femoral to anterior tibial artery bypass. With that being said in the interim the patient has developed significant lymphedema and has wounds that he tells me have really never healed in regard to the incision site on the left leg. He also has multiple wounds on the feet for various reasons some of which is that he tends to pick at his feet. Fortunately there is no signs of active infection systemically at this time he does have some wounds that are little bit deeper but most are fairly superficial he seems to have good blood flow and overall everything appears to be healthy I see no bone exposed and no obvious signs of osteomyelitis. I do not know that he necessarily needs a x-ray at this point although that something we could consider depending on how things progress. The patient does have a history of lymphedema, diabetes,  this is type II, chronic kidney disease stage III, hypertension, and history of peripheral vascular disease. 07/05/2020; patient admitted last week. Is a patient I remember from 2019 he had a spreading infection involving the left foot and we sent him to the hospital. 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 He subsequently had a left femoral to anterior tibial bypass by Dr.[facility]. He also has severe lymphedema with chronic skin changes related to that on the left leg. The most problematic area that was new today was on the left medial great toe. This was apparently a small area last week there was purulent drainage which our intake nurse cultured. Also areas on the left medial foot and heel left lateral foot. He has 2 areas on the left medial calf left lateral calf in the setting of the severe lymphedema. 07/13/2020 on evaluation today patient appears to be doing better in my opinion compared to his last visit. The good news is there is no signs of active infection systemically and locally I do not see any signs of infection either. He did have an x-ray which was negative that is great news he had a culture which showed MRSA but at the same time he is been on the doxycycline which has helped. I do think we may want to extend this for 7 additional days 1/25; patient admitted to the clinic a few weeks ago. 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 We have been putting him under compression his edema control is a lot better but he is severe verricused skin on the left leg. He is really done quite well he still has an open area on the left medial calf and the left medial first metatarsal head. We have been using silver collagen on the leg silver alginate on the foot 07/27/2020 upon evaluation today patient appears to be doing decently well in regard to his wounds. He still has a lot of dry skin on the left  leg. Some of this is starting to peel back and I think he may be able to have them out by removing some that today. Fortunately there is no signs of active infection at this time on the left leg although on the right leg he does appear to have swelling and erythema as well as some mild warmth to touch. This does have been concerned about the possibility of cellulitis although within the differential diagnosis I do think  that potentially a DVT has to be at least considered. We need to rule that out before proceeding would just call in the cellulitis. Especially since he is having pain in the posterior aspect of his calf muscle. 2/8; the patient had seen sparingly. He has severe skin changes of chronic lymphedema in the left leg thickened hyperkeratotic verrucous skin. He has an open wound on the medial part of the left first met head left mid tibia. He also has a rim of nonepithelialized skin in the anterior mid tibia. He brought in the AmLactin lotion that was been prescribed although I am not sure under compression and its utility. There concern about cellulitis on the right lower leg the last time he was here. He was put on on antibiotics. His DVT rule out was negative. The right leg looks fine he is using his stocking on this area 08/10/2020 upon evaluation today patient appears to be doing well with regard to his leg currently. He has been tolerating the dressing changes without complication. Fortunately there is no signs of active infection which is great news. Overall very pleased with where things stand. Objective Constitutional Well-nourished and well-hydrated in no acute distress. Vitals Time Taken: 9:44 AM, Height: 73 in, Weight: 245 lbs, BMI: 32.3, Temperature: 98.4 F, Pulse: 67 bpm, Respiratory Rate: 18 breaths/min, Blood Pressure: 124/76 mmHg, Capillary Blood Glucose: 118 mg/dl. Respiratory normal breathing without difficulty. Psychiatric this patient is able to make decisions and  demonstrates good insight into disease process. Alert and Oriented x 3. pleasant and cooperative. General Notes: Patient's wounds again are showing signs of good healing the leg in fact is healed to the region on his foot is much smaller he did have some callus I did actually go ahead and remove this today in order to clear away that from the surface of the wound allow this to heal he tolerated that without complication and post debridement the wound looked to be doing well. Integumentary (Hair, Skin) Wound #13 status is Open. Original cause of wound was Trauma. The wound is located on the Left,Distal,Medial Foot. The wound measures 0.8cm length x 0.4cm width x 0.1cm depth; 0.251cm^2 area and 0.025cm^3 volume. There is Fat Layer (Subcutaneous Tissue) exposed. There is no tunneling or undermining noted. There is a medium amount of serosanguineous drainage noted. The wound margin is distinct with the outline attached to the wound base. There is large (67-100%) red, pink granulation within the wound bed. There is a small (1-33%) amount of necrotic tissue within the wound bed including Adherent Slough. Wound #9 status is Healed - Epithelialized. Original cause of wound was Surgical Injury. The wound is located on the Left,Medial Lower Leg. The wound measures 0cm length x 0cm width x 0cm depth; 0cm^2 area and 0cm^3 volume. Other Condition(s) Patient presents with Rash / Dermatitis located on the Right Leg. General Notes: no redness, pain or edema noted today. Assessment Active Problems ICD-10 Type 2 diabetes mellitus with foot ulcer Lymphedema, not elsewhere classified Non-pressure chronic ulcer of other part of left foot with fat layer exposed Other specified peripheral vascular diseases Essential (primary) hypertension Chronic kidney disease, stage 3 unspecified Corns and callosities Procedures Wound #13 Pre-procedure diagnosis of Wound #13 is a Diabetic Wound/Ulcer of the Lower Extremity  located on the Left,Distal,Medial Foot .Severity of Tissue Pre Debridement is: Fat layer exposed. There was a Excisional Skin/Subcutaneous Tissue Debridement with a total area of 1.5 sq cm performed by [name] Keeler, PA. With the following instrument(s): Curette  to remove Viable and Non-Viable tissue/material. Material removed includes Callus, Subcutaneous Tissue, Slough, and Skin: Epidermis after achieving pain control using Lidocaine 5% topical ointment. No specimens were taken. A time out was conducted at 10:20, prior to the start of the procedure. A Minimum amount of bleeding was controlled with Pressure. The procedure was tolerated well with a pain level of 0 throughout and a pain level of 0 following the procedure. Post Debridement Measurements: 0.8cm length x 0.4cm width x 0.1cm depth; 0.025cm^3 volume. Character of Wound/Ulcer Post Debridement is improved. Severity of Tissue Post Debridement is: Fat layer exposed. Post procedure Diagnosis Wound #13: Same as Pre-Procedure Pre-procedure diagnosis of Wound #13 is a Diabetic Wound/Ulcer of the Lower Extremity located on the Left,Distal,Medial Foot . There was a Three Layer Compression Therapy Procedure by Rhae Hammock, RN. Post procedure Diagnosis Wound #13: Same as Pre-Procedure A Paring/cutting 1 benign hyperkeratotic lesion procedure was performed. by [name] Keeler, PA. Post procedure Diagnosis Wound #: Same as Pre-Procedure Notes: left plantar foot using #3 curette Plan Follow-up Appointments: Return Appointment in 1 week. Bathing/ Shower/ Hygiene: May shower with protection but do not get wound dressing(s) wet. - use cast protector to cover dressing in the shower Edema Control - Lymphedema / SCD / Other: Elevate legs to the level of the heart or above for 30 minutes daily and/or when sitting, a frequency of: - throughout the day Avoid standing for long periods of time. Exercise regularly Moisturize legs daily. - right  leg Compression stocking or Garment 20-30 mm/Hg pressure to: - right leg daily Off-Loading: Other: - healing sandal left foot. WOUND #13: - Foot Wound Laterality: Left, Medial, Distal Cleanser: Soap and Water 1 x Per Week/ Discharge Instructions: May shower and wash wound with dial antibacterial soap and water prior to dressing change. Peri-Wound Care: ammonium lactate 1 x Per Week/ Discharge Instructions: Apply to thickened skin on leg Prim Dressing: KerraCel Ag Gelling Fiber Dressing, 2x2 in (silver alginate) 1 x Per Week/ ary Discharge Instructions: Apply silver alginate to wound bed as instructed Secondary Dressing: Woven Gauze Sponge, Non-Sterile 4x4 in 1 x Per Week/ Discharge Instructions: Apply over primary dressing as directed. Com pression Wrap: FourPress (4 layer compression wrap) 1 x Per Week/ Discharge Instructions: Apply four layer compression as directed. 1. Would recommend currently that we continue with the wound care measures as before this includes the use of silver alginate dressing. I am also can recommend that we continue the 4-layer compression wrap. 2. Unfortunately after we left the room the patient did pull off a big piece of callus causing significant bleeding that had to be cauterized with silver nitrate to get it to stop bleeding. Other than that everything appears to be doing well. We will see patient back for reevaluation in 1 week here in the clinic. If anything worsens or changes patient will contact our office for additional recommendations. Electronic Signature(s) Signed: 08/10/2020 10:58:52 AM By: [name] Keeler PA-C Entered By: [name] on 08/10/2020 10:58:51 -------------------------------------------------------------------------------- SuperBill Details Patient Name: Date of Service: [name]. 08/10/2020 Medical Record Number: [id number] Patient Account Number: [id number] Date of Birth/Sex: Treating RN: 22-Jan-1951 (70 y.o. [name] Primary Care Provider: Jilda [name] Other Clinician: Referring Provider: Treating Provider/Extender: [name] in Treatment: 6 Diagnosis Coding ICD-10 Codes Code Description [phone] Type 2 diabetes mellitus with foot ulcer I89.0 Lymphedema, not elsewhere classified L97.522 Non-pressure chronic ulcer of other part of left foot with fat  layer exposed I73.89 Other specified peripheral vascular diseases I10 Essential (primary) hypertension N18.30 Chronic kidney disease, stage 3 unspecified L84 Corns and callosities Facility Procedures CPT4 Code: [id number] 110 IC L Description: 11042 - DEB SUBQ TISSUE 20 SQ CM/< ICD-10 Diagnosis Description L97.522 Non-pressure chronic ulcer of other part of left foot with fat layer exposed 55 - PARE BENIGN LES; SGL 59 D-10 Diagnosis Description 84 Corns and callosities Modifier: 1 Quantity: 1 Physician Procedures : CPT4 Code Description Modifier [id number] 11042 - WC PHYS SUBQ TISS 20 SQ CM ICD-10 Diagnosis Description L97.522 Non-pressure chronic ulcer of other part of left foot with fat layer exposed Quantity: 1 : [id number] 11055 - WC PHYS PARE BENIGN LES; SGL 59 ICD-10 Diagnosis Description L84 Corns and callosities Quantity: 1 Electronic Signature(s) Signed: 08/10/2020 11:00:09 AM By: [name] Keeler PA-C Entered By: [name] on 08/10/2020 11:00:08

## 2020-08-12 ENCOUNTER — Ambulatory Visit: Payer: BC Managed Care – PPO | Admitting: Vascular Surgery

## 2020-08-12 ENCOUNTER — Ambulatory Visit (HOSPITAL_COMMUNITY): Payer: BC Managed Care – PPO

## 2020-08-12 NOTE — Progress Notes (Signed)
KARAN, RAMNAUTH ([id number]) Visit Report for 08/10/2020 Arrival Information Details Patient Name: Date of Service: RAYYAN, BURLEY 08/10/2020 9:30 A M Medical Record Number: [id number] Patient Account Number: [id number] Date of Birth/Sex: Treating RN: [birth year] (70 y.o. [name], [name] Primary Care Janeka Libman: [name] Other Clinician: Referring Kadince Boxley: Treating Maryanna Stuber/Extender: Myles Gip in Treatment: 6 Visit Information History Since Last Visit Added or deleted any medications: No Patient Arrived: Ambulatory Any new allergies or adverse reactions: No Arrival Time: 09:43 Had a fall or experienced change in No Accompanied By: self activities of daily living that may affect Transfer Assistance: None risk of falls: Patient Identification Verified: Yes Signs or symptoms of abuse/neglect since last visito No Secondary Verification Process Completed: Yes Hospitalized since last visit: No Patient Requires Transmission-Based No Implantable device outside of the clinic excluding No Precautions: cellular tissue based products placed in the center Patient Has Alerts: Yes since last visit: Patient Alerts: Patient on Blood Thinner Has Dressing in Place as Prescribed: Yes 01/29/20 LABI 1.17 TBI 0.88 Has Compression in Place as Prescribed: Yes 01/29/20 R 1.23 Pain Present Now: No Electronic Signature(s) Signed: 08/10/2020 5:36:42 PM By: Deon Pilling Entered By: Deon Pilling on 08/10/2020 09:57:42 -------------------------------------------------------------------------------- Compression Therapy Details Patient Name: Date of Service: [name] 08/10/2020 9:30 A M Medical Record Number: [id number] Patient Account Number: [id number] Date of Birth/Sex: Treating RN: [birth year] (70 y.o. [name] Primary Care Fatma Rutten: [name] Other Clinician: Referring Deangleo Passage: Treating Asli Tokarski/Extender: Myles Gip in Treatment:  6 Compression Therapy Performed for Wound Assessment: Wound #13 Left,Distal,Medial Foot Performed By: Clinician Rhae Hammock, RN Compression Type: Three Layer Post Procedure Diagnosis Same as Pre-procedure Electronic Signature(s) Signed: 08/10/2020 6:01:36 PM By: Baruch Gouty RN, BSN Entered By: Baruch Gouty on 08/10/2020 10:25:12 -------------------------------------------------------------------------------- Encounter Discharge Information Details Patient Name: Date of Service: [name] 08/10/2020 9:30 A M Medical Record Number: [id number] Patient Account Number: [id number] Date of Birth/Sex: Treating RN: 1950/09/08 (70 y.o. [name], [name] Primary Care Tess Potts: [name] Other Clinician: Referring Tukker Byrns: Treating Lanson Randle/Extender: Myles Gip in Treatment: 6 Encounter Discharge Information Items Post Procedure Vitals Discharge Condition: Stable Temperature (F): 98.4 Ambulatory Status: Ambulatory Pulse (bpm): 67 Discharge Destination: Home Respiratory Rate (breaths/min): 17 Transportation: Private Auto Blood Pressure (mmHg): 124/76 Accompanied By: self Schedule Follow-up Appointment: Yes Clinical Summary of Care: Patient Declined Electronic Signature(s) Signed: 08/12/2020 5:09:47 PM By: Rhae Hammock RN Entered By: Rhae Hammock on 08/10/2020 10:59:22 -------------------------------------------------------------------------------- Lower Extremity Assessment Details Patient Name: Date of Service: [name] 08/10/2020 9:30 A M Medical Record Number: [id number] Patient Account Number: [id number] Date of Birth/Sex: Treating RN: Jun 03, 1951 (69 y.o. [name] Primary Care Saralyn Willison: [name] Other Clinician: Referring Rowen Wilmer: Treating Rejina Odle/Extender: Myles Gip in Treatment: 6 Edema Assessment Assessed: [name]: Yes] [Right: No] Edema: [Left: N] [Right:  o] Calf Left: Right: Point of Measurement: 39 cm From Medial Instep 37 cm Ankle Left: Right: Point of Measurement: 12 cm From Medial Instep 24.5 cm Electronic Signature(s) Signed: 08/10/2020 5:36:42 PM By: Deon Pilling Entered By: Deon Pilling on 08/10/2020 09:58:29 -------------------------------------------------------------------------------- [place] Details Patient Name: Date of Service: [name] 08/10/2020 9:30 A M Medical Record Number: [id number] Patient Account Number: [id number] Date of Birth/Sex: Treating RN: June 02, 1951 (70 y.o. [name] Primary Care Leiah Giannotti: [name] Other Clinician: Referring Chaney Ingram: Treating Mindi Akerson/Extender: Myles Gip in Treatment:  6 Active Inactive Nutrition Nursing Diagnoses: Impaired glucose control: actual or potential Potential for alteratiion in Nutrition/Potential for imbalanced nutrition Goals: Patient/caregiver agrees to and verbalizes understanding of need to use nutritional supplements and/or vitamins as prescribed Date Initiated: 06/29/2020 Date Inactivated: 07/27/2020 Target Resolution Date: 07/29/2020 Goal Status: Met Patient/caregiver will maintain therapeutic glucose control Date Initiated: 06/29/2020 Target Resolution Date: 08/24/2020 Goal Status: Active Interventions: Assess HgA1c results as ordered upon admission and as needed Assess patient nutrition upon admission and as needed per policy Provide education on elevated blood sugars and impact on wound healing Provide education on nutrition Treatment Activities: Education provided on Nutrition : 06/29/2020 Notes: Venous Leg Ulcer Nursing Diagnoses: Actual venous Insuffiency (use after diagnosis is confirmed) Knowledge deficit related to disease process and management Goals: Patient will maintain optimal edema control Date Initiated: 06/29/2020 Target Resolution Date: 08/24/2020 Goal Status:  Active Patient/caregiver will verbalize understanding of disease process and disease management Date Initiated: 06/29/2020 Date Inactivated: 07/27/2020 Target Resolution Date: 07/29/2020 Goal Status: Met Interventions: Assess peripheral edema status every visit. Compression as ordered Provide education on venous insufficiency Notes: Wound/Skin Impairment Nursing Diagnoses: Impaired tissue integrity Knowledge deficit related to ulceration/compromised skin integrity Goals: Patient/caregiver will verbalize understanding of skin care regimen Date Initiated: 06/29/2020 Target Resolution Date: 08/24/2020 Goal Status: Active Interventions: Assess patient/caregiver ability to obtain necessary supplies Assess patient/caregiver ability to perform ulcer/skin care regimen upon admission and as needed Assess ulceration(s) every visit Provide education on ulcer and skin care Notes: Electronic Signature(s) Signed: 08/10/2020 6:01:36 PM By: Baruch Gouty RN, BSN Entered By: Baruch Gouty on 08/10/2020 10:24:11 -------------------------------------------------------------------------------- Non-Wound Condition Assessment Details Patient Name: Date of Service: [name] 08/10/2020 9:30 A M Medical Record Number: [id number] Patient Account Number: [id number] Date of Birth/Sex: Treating RN: 1950-09-22 (70 y.o. [name] Primary Care Micaylah Bertucci: [name] Other Clinician: Referring Itzae Miralles: Treating Samaria Anes/Extender: Myles Gip in Treatment: 6 Non-Wound Condition: Condition: Rash / Dermatitis Location: Leg Side: Right Notes no redness, pain or edema noted today. Electronic Signature(s) Signed: 08/10/2020 5:36:42 PM By: Deon Pilling Entered By: Deon Pilling on 08/10/2020 09:59:37 -------------------------------------------------------------------------------- Pain Assessment Details Patient Name: Date of Service: DOY, TAAFFE 08/10/2020 9:30 A  M Medical Record Number: [id number] Patient Account Number: [id number] Date of Birth/Sex: Treating RN: Dec 09, 1950 (70 y.o. [name] Primary Care Cleatus Goodin: [name] Other Clinician: Referring Tyran Huser: Treating Jalene Demo/Extender: Myles Gip in Treatment: 6 Active Problems Location of Pain Severity and Description of Pain Patient Has Paino No Site Locations Rate the pain. Current Pain Level: 0 Pain Management and Medication Current Pain Management: Medication: No Cold Application: No Rest: No Massage: No Activity: No T.E.N.S.: No Heat Application: No Leg drop or elevation: No Is the Current Pain Management Adequate: Adequate How does your wound impact your activities of daily livingo Sleep: No Bathing: No Appetite: No Relationship With Others: No Bladder Continence: No Emotions: No Bowel Continence: No Work: No Toileting: No Drive: No Dressing: No Hobbies: No Electronic Signature(s) Signed: 08/10/2020 5:36:42 PM By: Deon Pilling Entered By: Deon Pilling on 08/10/2020 09:58:18 -------------------------------------------------------------------------------- Patient/Caregiver Education Details Patient Name: Date of Service: [place], [name] 2/16/2022andnbsp9:30 A M Medical Record Number: [id number] Patient Account Number: [id number] Date of Birth/Gender: Treating RN: [birth year] (70 y.o. [name] Primary Care Physician: [name] Other Clinician: Referring Physician: Treating Physician/Extender: Myles Gip in Treatment: 6 Education Assessment Education Provided To: Patient Education Topics Provided Venous: Methods: Explain/Verbal  Responses: Reinforcements needed, State content correctly Wound/Skin Impairment: Methods: Explain/Verbal Responses: Reinforcements needed, State content correctly Electronic Signature(s) Signed: 08/10/2020 6:01:36 PM By: Baruch Gouty RN, BSN Entered By:  Baruch Gouty on 08/10/2020 10:24:36 -------------------------------------------------------------------------------- Wound Assessment Details Patient Name: Date of Service: [name] 08/10/2020 9:30 A M Medical Record Number: [id number] Patient Account Number: [id number] Date of Birth/Sex: Treating RN: September 25, 1950 (70 y.o. [name], [name] Primary Care Shree Espey: [name] Other Clinician: Referring Merisa Julio: Treating Shirleyann Montero/Extender: Myles Gip in Treatment: 6 Wound Status Wound Number: 13 Primary Diabetic Wound/Ulcer of the Lower Extremity Etiology: Wound Location: Left, Distal, Medial Foot Wound Open Wounding Event: Trauma Status: Date Acquired: 05/25/2020 Comorbid Glaucoma, Sleep Apnea, Hypertension, Peripheral Arterial Disease, Weeks Of Treatment: 6 History: Peripheral Venous Disease, Type II Diabetes, Gout, Osteoarthritis, Clustered Wound: No Neuropathy Photos Photo Uploaded By: Mikeal Hawthorne on 08/11/2020 09:04:35 Wound Measurements Length: (cm) 0.8 Width: (cm) 0.4 Depth: (cm) 0.1 Area: (cm) 0.251 Volume: (cm) 0.025 % Reduction in Area: -6.4% % Reduction in Volume: -4.2% Epithelialization: Large (67-100%) Tunneling: No Undermining: No Wound Description Classification: Grade 2 Wound Margin: Distinct, outline attached Exudate Amount: Medium Exudate Type: Serosanguineous Exudate Color: red, brown Foul Odor After Cleansing: No Slough/Fibrino Yes Wound Bed Granulation Amount: Large (67-100%) Exposed Structure Granulation Quality: Red, Pink Fascia Exposed: No Necrotic Amount: Small (1-33%) Fat Layer (Subcutaneous Tissue) Exposed: Yes Necrotic Quality: Adherent Slough Tendon Exposed: No Muscle Exposed: No Joint Exposed: No Bone Exposed: No Treatment Notes Wound #13 (Foot) Wound Laterality: Left, Medial, Distal Cleanser Soap and Water Discharge Instruction: May shower and wash wound with dial antibacterial soap and  water prior to dressing change. Peri-Wound Care ammonium lactate Discharge Instruction: Apply to thickened skin on leg Topical Primary Dressing KerraCel Ag Gelling Fiber Dressing, 2x2 in (silver alginate) Discharge Instruction: Apply silver alginate to wound bed as instructed Secondary Dressing Woven Gauze Sponge, Non-Sterile 4x4 in Discharge Instruction: Apply over primary dressing as directed. Secured With Compression Wrap FourPress (4 layer compression wrap) Discharge Instruction: Apply four layer compression as directed. Compression Stockings Add-Ons Electronic Signature(s) Signed: 08/10/2020 5:36:42 PM By: Deon Pilling Entered By: Deon Pilling on 08/10/2020 09:59:14 -------------------------------------------------------------------------------- Wound Assessment Details Patient Name: Date of Service: COLBURN, ASPER 08/10/2020 9:30 A M Medical Record Number: [id number] Patient Account Number: [id number] Date of Birth/Sex: Treating RN: 03/10/1951 (70 y.o. [name], [name] Primary Care Kimi Bordeau: [name] Other Clinician: Referring Tiannah Greenly: Treating Efrat Zuidema/Extender: Myles Gip in Treatment: 6 Wound Status Wound Number: 9 Primary Etiology: Open Surgical Wound Wound Location: Left, Medial Lower Leg Secondary Etiology: Lymphedema Wounding Event: Surgical Injury Wound Status: Healed - Epithelialized Date Acquired: 10/23/2019 Weeks Of Treatment: 6 Clustered Wound: No Photos Photo Uploaded By: Mikeal Hawthorne on 08/11/2020 09:04:47 Wound Measurements Length: (cm) Width: (cm) Depth: (cm) Area: (cm) Volume: (cm) 0 % Reduction in Area: 100% 0 % Reduction in Volume: 100% 0 0 0 Wound Description Classification: Full Thickness Without Exposed Support Structur es Treatment Notes Wound #9 (Lower Leg) Wound Laterality: Left, Medial Cleanser Peri-Wound Care Topical Primary Dressing Secondary Dressing Secured With Compression  Wrap Compression Stockings Add-Ons Electronic Signature(s) Signed: 08/10/2020 5:36:42 PM By: Deon Pilling Entered By: Deon Pilling on 08/10/2020 09:58:39 -------------------------------------------------------------------------------- Vitals Details Patient Name: Date of Service: [name] 08/10/2020 9:30 A M Medical Record Number: [id number] Patient Account Number: [id number] Date of Birth/Sex: Treating RN: [birth year] (69 y.o. [name] Primary Care Khalila Buechner: [name] Other Clinician: Referring Daylin Eads: Treating  Franky Reier/Extender: Dallas Schimke Weeks in Treatment: 6 Vital Signs Time Taken: 09:44 Temperature (F): 98.4 Height (in): 73 Pulse (bpm): 67 Weight (lbs): 245 Respiratory Rate (breaths/min): 18 Body Mass Index (BMI): 32.3 Blood Pressure (mmHg): 124/76 Capillary Blood Glucose (mg/dl): 118 Reference Range: 80 - 120 mg / dl Electronic Signature(s) Signed: 08/10/2020 5:36:42 PM By: Deon Pilling Entered By: Deon Pilling on 08/10/2020 09:58:05

## 2020-08-16 ENCOUNTER — Other Ambulatory Visit: Payer: Self-pay

## 2020-08-16 ENCOUNTER — Encounter (HOSPITAL_BASED_OUTPATIENT_CLINIC_OR_DEPARTMENT_OTHER): Payer: BC Managed Care – PPO | Admitting: Internal Medicine

## 2020-08-16 DIAGNOSIS — E11621 Type 2 diabetes mellitus with foot ulcer: Secondary | ICD-10-CM | POA: Diagnosis not present

## 2020-08-16 NOTE — Progress Notes (Signed)
[name], [name] ([id number]) Visit Report for 08/16/2020 Arrival Information Details Patient Name: Date of Service: [name], [name] 08/16/2020 9:15 A M Medical Record Number: [id number] Patient Account Number: [id number] Date of Birth/Sex: Treating RN: [birth year] (70 y.o. [name], [place] Primary Care Alfonzia Woolum: [name] Other Clinician: Referring Roxy Filler: Treating Cheresa Siers/Extender: Stormy Card in Treatment: 6 Visit Information History Since Last Visit Added or deleted any medications: No Patient Arrived: Ambulatory Any new allergies or adverse reactions: No Arrival Time: 09:32 Had a fall or experienced change in No Accompanied By: self activities of daily living that may affect Transfer Assistance: None risk of falls: Patient Identification Verified: Yes Signs or symptoms of abuse/neglect since last visito No Secondary Verification Process Completed: Yes Hospitalized since last visit: No Patient Requires Transmission-Based Precautions: No Implantable device outside of the clinic excluding No Patient Has Alerts: Yes cellular tissue based products placed in the center Patient Alerts: Patient on Blood Thinner since last visit: 01/29/20 LABI 1.17 TBI 0.88 Has Dressing in Place as Prescribed: Yes 01/29/20 R 1.23 Pain Present Now: No Electronic Signature(s) Signed: 08/16/2020 9:47:25 AM By: Sandre Kitty Entered By: Sandre Kitty on 08/16/2020 09:33:16 -------------------------------------------------------------------------------- Compression Therapy Details Patient Name: Date of Service: [name]. 08/16/2020 9:15 A M Medical Record Number: [id number] Patient Account Number: [id number] Date of Birth/Sex: Treating RN: 25-Apr-1951 (70 y.o. [name], [name] Primary Care Taressa Rauh: [name] Other Clinician: Referring Shley Dolby: Treating Jari Carollo/Extender: Stormy Card in Treatment: 6 Compression Therapy Performed  for Wound Assessment: Wound #13 Left,Distal,Medial Foot Performed By: Clinician Rhae Hammock, RN Compression Type: Four Layer Post Procedure Diagnosis Same as Pre-procedure Electronic Signature(s) Signed: 08/16/2020 5:43:17 PM By: Rhae Hammock RN Entered By: Rhae Hammock on 08/16/2020 10:38:41 -------------------------------------------------------------------------------- Encounter Discharge Information Details Patient Name: Date of Service: [name]. 08/16/2020 9:15 A M Medical Record Number: [id number] Patient Account Number: [id number] Date of Birth/Sex: Treating RN: [birth year] (70 y.o. [name] Primary Care Taelon Bendorf: [name] Other Clinician: Referring Deran Barro: Treating Markeshia Giebel/Extender: Stormy Card in Treatment: 6 Encounter Discharge Information Items Post Procedure Vitals Discharge Condition: Stable Temperature (F): 98.4 Ambulatory Status: Ambulatory Pulse (bpm): 60 Discharge Destination: Home Respiratory Rate (breaths/min): 18 Transportation: Private Auto Blood Pressure (mmHg): 145/90 Accompanied By: alone Schedule Follow-up Appointment: Yes Clinical Summary of Care: Patient Declined Electronic Signature(s) Signed: 08/16/2020 5:33:10 PM By: Levan Hurst RN, BSN Entered By: Levan Hurst on 08/16/2020 12:50:35 -------------------------------------------------------------------------------- Lower Extremity Assessment Details Patient Name: Date of Service: [name]. 08/16/2020 9:15 A M Medical Record Number: [id number] Patient Account Number: [id number] Date of Birth/Sex: Treating RN: 04-28-1951 (70 y.o. [name] Primary Care Kellyn Mccary: [name] Other Clinician: Referring Laine Fonner: Treating Justina Bertini/Extender: Stormy Card in Treatment: 6 Edema Assessment Assessed: [name]: No] [name]: No] Edema: [Left: N] [Right: o] Calf Left: Right: Point of Measurement: 39 cm  From Medial Instep 36 cm Ankle Left: Right: Point of Measurement: 12 cm From Medial Instep 24 cm Vascular Assessment Pulses: Dorsalis Pedis Palpable: [Left:Yes] Electronic Signature(s) Signed: 08/16/2020 5:33:10 PM By: Levan Hurst RN, BSN Entered By: Levan Hurst on 08/16/2020 09:54:52 -------------------------------------------------------------------------------- Multi Wound Chart Details Patient Name: Date of Service: [name]. 08/16/2020 9:15 A M Medical Record Number: [id number] Patient Account Number: [id number] Date of Birth/Sex: Treating RN: [birth year] (69 y.o. [name] Primary Care Amarilis Belflower: [name] Other Clinician: Referring Brentlee Sciara: Treating Antonius Hartlage/Extender: Stormy Card in Treatment: 6 Vital Signs  Height(in): 73 Pulse(bpm): 60 Weight(lbs): 245 Blood Pressure(mmHg): 145/90 Body Mass Index(BMI): 32 Temperature(F): 98.4 Respiratory Rate(breaths/min): 18 Photos: [13:No Photos Left, Distal, Medial Foot] [N/A:N/A N/A] Wound Location: [13:Trauma] [N/A:N/A] Wounding Event: [13:Diabetic Wound/Ulcer of the Lower] [N/A:N/A] Primary Etiology: [13:Extremity Glaucoma, Sleep Apnea,] [N/A:N/A] Comorbid History: [13:Hypertension, Peripheral Arterial Disease, Peripheral Venous Disease, Type II Diabetes, Gout, Osteoarthritis, Neuropathy 05/25/2020] [N/A:N/A] Date Acquired: [13:6] [N/A:N/A] Weeks of Treatment: [13:Open] [N/A:N/A] Wound Status: [13:1.1x1.2x0.1] [N/A:N/A] Measurements L x W x D (cm) [13:1.037] [N/A:N/A] A (cm) : rea [13:0.104] [N/A:N/A] Volume (cm) : [13:-339.40%] [N/A:N/A] % Reduction in A [13:rea: -333.30%] [N/A:N/A] % Reduction in Volume: [13:Grade 2] [N/A:N/A] Classification: [13:Medium] [N/A:N/A] Exudate A mount: [13:Serosanguineous] [N/A:N/A] Exudate Type: [13:red, brown] [N/A:N/A] Exudate Color: [13:Distinct, outline attached] [N/A:N/A] Wound Margin: [13:Medium (34-66%)] [N/A:N/A] Granulation A  mount: [13:Pink] [N/A:N/A] Granulation Quality: [13:Medium (34-66%)] [N/A:N/A] Necrotic A mount: [13:Fat Layer (Subcutaneous Tissue): Yes N/A] Exposed Structures: [13:Fascia: No Tendon: No Muscle: No Joint: No Bone: No Medium (34-66%)] [N/A:N/A] Epithelialization: [13:Debridement - Excisional] [N/A:N/A] Debridement: Pre-procedure Verification/Time Out 10:37 [N/A:N/A] Taken: [13:Lidocaine] [N/A:N/A] Pain Control: [13:Callus, Subcutaneous] [N/A:N/A] Tissue Debrided: [13:Skin/Subcutaneous Tissue] [N/A:N/A] Level: [13:1.32] [N/A:N/A] Debridement A (sq cm): [13:rea Curette] [N/A:N/A] Instrument: [13:Minimum] [N/A:N/A] Bleeding: [13:Pressure] [N/A:N/A] Hemostasis A chieved: [13:0] [N/A:N/A] Procedural Pain: [13:0] [N/A:N/A] Post Procedural Pain: [13:Procedure was tolerated well] [N/A:N/A] Debridement Treatment Response: [13:1.1x1.2x0.1] [N/A:N/A] Post Debridement Measurements L x W x D (cm) [13:0.104] [N/A:N/A] Post Debridement Volume: (cm) [13:Compression Therapy] [N/A:N/A] Procedures Performed: [13:Debridement] Treatment Notes Electronic Signature(s) Signed: 08/16/2020 5:22:30 PM By: Linton Ham MD Signed: 08/16/2020 5:43:17 PM By: Rhae Hammock RN Entered By: Linton Ham on 08/16/2020 11:06:36 -------------------------------------------------------------------------------- Multi-Disciplinary Care Plan Details Patient Name: Date of Service: [name]. 08/16/2020 9:15 A M Medical Record Number: [id number] Patient Account Number: [id number] Date of Birth/Sex: Treating RN: June 24, 1951 (70 y.o. [name], [name] Primary Care Cheronda Erck: [name] Other Clinician: Referring Jacqueli Pangallo: Treating Avan Gullett/Extender: Stormy Card in Treatment: 6 Multidisciplinary Care Plan reviewed with physician Active Inactive Nutrition Nursing Diagnoses: Impaired glucose control: actual or potential Potential for alteratiion in Nutrition/Potential for  imbalanced nutrition Goals: Patient/caregiver agrees to and verbalizes understanding of need to use nutritional supplements and/or vitamins as prescribed Date Initiated: 06/29/2020 Date Inactivated: 07/27/2020 Target Resolution Date: 07/29/2020 Goal Status: Met Patient/caregiver will maintain therapeutic glucose control Date Initiated: 06/29/2020 Target Resolution Date: 08/24/2020 Goal Status: Active Interventions: Assess HgA1c results as ordered upon admission and as needed Assess patient nutrition upon admission and as needed per policy Provide education on elevated blood sugars and impact on wound healing Provide education on nutrition Treatment Activities: Education provided on Nutrition : 06/29/2020 Notes: Venous Leg Ulcer Nursing Diagnoses: Actual venous Insuffiency (use after diagnosis is confirmed) Knowledge deficit related to disease process and management Goals: Patient will maintain optimal edema control Date Initiated: 06/29/2020 Target Resolution Date: 08/24/2020 Goal Status: Active Patient/caregiver will verbalize understanding of disease process and disease management Date Initiated: 06/29/2020 Date Inactivated: 07/27/2020 Target Resolution Date: 07/29/2020 Goal Status: Met Interventions: Assess peripheral edema status every visit. Compression as ordered Provide education on venous insufficiency Notes: Wound/Skin Impairment Nursing Diagnoses: Impaired tissue integrity Knowledge deficit related to ulceration/compromised skin integrity Goals: Patient/caregiver will verbalize understanding of skin care regimen Date Initiated: 06/29/2020 Target Resolution Date: 08/24/2020 Goal Status: Active Interventions: Assess patient/caregiver ability to obtain necessary supplies Assess patient/caregiver ability to perform ulcer/skin care regimen upon admission and as needed Assess ulceration(s) every visit Provide education on ulcer and skin care Notes: Electronic Signature(s) Signed:  08/16/2020  5:43:17 PM By: Rhae Hammock RN Entered By: Rhae Hammock on 08/16/2020 10:27:29 -------------------------------------------------------------------------------- Pain Assessment Details Patient Name: Date of Service: [name]. 08/16/2020 9:15 A M Medical Record Number: [id number] Patient Account Number: [id number] Date of Birth/Sex: Treating RN: Dec 28, 1950 (70 y.o. [name], [name] Primary Care Shakala Marlatt: [name] Other Clinician: Referring Nari Vannatter: Treating Chailyn Racette/Extender: Stormy Card in Treatment: 6 Active Problems Location of Pain Severity and Description of Pain Patient Has Paino No Site Locations Pain Management and Medication Current Pain Management: Electronic Signature(s) Signed: 08/16/2020 9:47:25 AM By: Sandre Kitty Signed: 08/16/2020 5:43:17 PM By: Rhae Hammock RN Entered By: Sandre Kitty on 08/16/2020 09:36:32 -------------------------------------------------------------------------------- Patient/Caregiver Education Details Patient Name: Date of Service: [name] 2/22/2022andnbsp9:15 A M Medical Record Number: [id number] Patient Account Number: [id number] Date of Birth/Gender: Treating RN: 1951-06-11 (69 y.o. [name] Primary Care Physician: [name] Other Clinician: Referring Physician: Treating Physician/Extender: Stormy Card in Treatment: 6 Education Assessment Education Provided To: Patient Education Topics Provided Elevated Blood Sugar/ Impact on Healing: Methods: Explain/Verbal Responses: State content correctly Nutrition: Methods: Explain/Verbal Responses: State content correctly Venous: Methods: Explain/Verbal Responses: State content correctly Wound/Skin Impairment: Methods: Explain/Verbal Responses: State content correctly Electronic Signature(s) Signed: 08/16/2020 5:43:17 PM By: Rhae Hammock RN Entered By: Rhae Hammock on 08/16/2020 10:28:06 -------------------------------------------------------------------------------- Wound Assessment Details Patient Name: Date of Service: [name]. 08/16/2020 9:15 A M Medical Record Number: [id number] Patient Account Number: [id number] Date of Birth/Sex: Treating RN: Dec 02, 1950 (69 y.o. [name], [name] Primary Care Kikuye Korenek: [name] Other Clinician: Referring Ted Leonhart: Treating Ronte Parker/Extender: Stormy Card in Treatment: 6 Wound Status Wound Number: 13 Primary Diabetic Wound/Ulcer of the Lower Extremity Etiology: Wound Location: Left, Distal, Medial Foot Wound Open Wounding Event: Trauma Status: Date Acquired: 05/25/2020 Comorbid Glaucoma, Sleep Apnea, Hypertension, Peripheral Arterial Disease, Weeks Of Treatment: 6 History: Peripheral Venous Disease, Type II Diabetes, Gout, Osteoarthritis, Clustered Wound: No Neuropathy Wound Measurements Length: (cm) 1.1 Width: (cm) 1.2 Depth: (cm) 0.1 Area: (cm) 1.037 Volume: (cm) 0.104 % Reduction in Area: -339.4% % Reduction in Volume: -333.3% Epithelialization: Medium (34-66%) Tunneling: No Undermining: No Wound Description Classification: Grade 2 Wound Margin: Distinct, outline attached Exudate Amount: Medium Exudate Type: Serosanguineous Exudate Color: red, brown Foul Odor After Cleansing: No Slough/Fibrino Yes Wound Bed Granulation Amount: Medium (34-66%) Exposed Structure Granulation Quality: Pink Fascia Exposed: No Necrotic Amount: Medium (34-66%) Fat Layer (Subcutaneous Tissue) Exposed: Yes Necrotic Quality: Adherent Slough Tendon Exposed: No Muscle Exposed: No Joint Exposed: No Bone Exposed: No Treatment Notes Wound #13 (Foot) Wound Laterality: Left, Medial, Distal Cleanser Soap and Water Discharge Instruction: May shower and wash wound with dial antibacterial soap and water prior to dressing change. Peri-Wound Care ammonium  lactate Discharge Instruction: Apply to thickened skin on leg Topical Primary Dressing Hydrofera Blue Classic Foam, 2x2 in Discharge Instruction: Moisten with saline prior to applying to wound bed Secondary Dressing Woven Gauze Sponge, Non-Sterile 4x4 in Discharge Instruction: Apply over primary dressing as directed. Secured With Compression Wrap FourPress (4 layer compression wrap) Discharge Instruction: Apply four layer compression as directed. Compression Stockings Add-Ons Electronic Signature(s) Signed: 08/16/2020 5:33:10 PM By: Levan Hurst RN, BSN Signed: 08/16/2020 5:43:17 PM By: Rhae Hammock RN Previous Signature: 08/16/2020 9:47:25 AM Version By: Sandre Kitty Entered By: Levan Hurst on 08/16/2020 09:56:30 -------------------------------------------------------------------------------- Vitals Details Patient Name: Date of Service: [name]. 08/16/2020 9:15 A M Medical Record Number: [id number] Patient Account Number: [id number] Date of  Birth/Sex: Treating RN: [birth year] (70 y.o. [name], [name] Primary Care Tyreonna Czaplicki: [name] Other Clinician: Referring Latesia Norrington: Treating Legend Pecore/Extender: Stormy Card in Treatment: 6 Vital Signs Time Taken: 09:33 Temperature (F): 98.4 Height (in): 73 Pulse (bpm): 60 Weight (lbs): 245 Respiratory Rate (breaths/min): 18 Body Mass Index (BMI): 32.3 Blood Pressure (mmHg): 145/90 Reference Range: 80 - 120 mg / dl Electronic Signature(s) Signed: 08/16/2020 9:47:25 AM By: Sandre Kitty Entered By: Sandre Kitty on 08/16/2020 09:36:26

## 2020-08-16 NOTE — Progress Notes (Signed)
CLARENCE, COGSWELL ([id number]) Visit Report for 08/16/2020 Debridement Details Patient Name: Date of Service: JEFFERIE, HOLSTON 08/16/2020 9:15 A M Medical Record Number: [id number] Patient Account Number: [id number] Date of Birth/Sex: Treating RN: 08-06-1950 (70 y.o. [name], [name] Primary Care Provider: [name] Other Clinician: Referring Provider: Treating Provider/Extender: Stormy Card in Treatment: 6 Debridement Performed for Assessment: Wound #13 Left,Distal,Medial Foot Performed By: Physician Ricard Dillon., MD Debridement Type: Debridement Severity of Tissue Pre Debridement: Fat layer exposed Level of Consciousness (Pre-procedure): Awake and Alert Pre-procedure Verification/Time Out Yes - 10:37 Taken: Start Time: 10:37 Pain Control: Lidocaine T Area Debrided (L x W): otal 1.1 (cm) x 1.2 (cm) = 1.32 (cm) Tissue and other material debrided: Viable, Non-Viable, Callus, Subcutaneous, Skin: Dermis , Skin: Epidermis Level: Skin/Subcutaneous Tissue Debridement Description: Excisional Instrument: Curette Bleeding: Minimum Hemostasis Achieved: Pressure End Time: 10:37 Procedural Pain: 0 Post Procedural Pain: 0 Response to Treatment: Procedure was tolerated well Level of Consciousness (Post- Awake and Alert procedure): Post Debridement Measurements of Total Wound Length: (cm) 1.1 Width: (cm) 1.2 Depth: (cm) 0.1 Volume: (cm) 0.104 Character of Wound/Ulcer Post Debridement: Improved Severity of Tissue Post Debridement: Fat layer exposed Post Procedure Diagnosis Same as Pre-procedure Electronic Signature(s) Signed: 08/16/2020 5:22:30 PM By: Linton Ham MD Signed: 08/16/2020 5:43:17 PM By: Rhae Hammock RN Entered By: Linton Ham on 08/16/2020 11:06:52 -------------------------------------------------------------------------------- HPI Details Patient Name: Date of Service: [name] 08/16/2020 9:15 A M Medical Record  Number: [id number] Patient Account Number: [id number] Date of Birth/Sex: Treating RN: 05-08-1951 (70 y.o. [name] Primary Care Provider: [name] Other Clinician: Referring Provider: Treating Provider/Extender: Stormy Card in Treatment: 6 History of Present Illness HPI Description: 10/11/17; Mr. Willette is a 70 year old man who tells me that in 2015 he slipped down the latter traumatizing his left leg. He developed a wound in the same spot the area that we are currently looking at. He states this closed over for the most part although he always felt it was somewhat unstable. In 2016 he hit the same area with the door of his car had this reopened. He tells me that this is never really closed although sometimes an inflow it remains open on a constant basis. He has not been using any specific dressing to this except for topical antibiotics the nature of which were not really sure. His primary doctor did send him to see Dr. [name] of interventional cardiology. He underwent an angiogram on 08/06/17 and he underwent a PTA and directional atherectomy of the lesser distal SFA and popliteal arteries which resulted in brisk improvement in blood flow. It was noted that he had 2 vessel runoff through the anterior tibial and peroneal. He is also been to see vascular and interventional radiologist. He was not felt to have any significant superficial venous insufficiency. Presumably is not a candidate for any ablation. It was suggested he come here for wound care. The patient is a type II diabetic on insulin. He also has a history of venous insufficiency. ABIs on the left were noncompressible in our clinic 10/21/17; patient we admitted to the clinic last week. 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 We put Iodosorb on him after an aggressive debridement and 3 layer compression. He complained of pain in his ankle  and itching with is skin in fact he scratched the area on the medial calf superiorly at the  rim of our wraps and he has 2 small open areas in that location today which are new. I changed his primary dressing today to silver collagen. As noted he is already had revascularization and does not have any significant superficial venous insufficiency that would be amenable to ablation 10/28/17; patient admitted to the clinic 2 weeks ago. He has a smaller Wound. Scratch injury from last week revealed. There is large wound over the tibial area. This is smaller. Granulation looks healthy. No need for debridement. 11/04/17; the wound on the left lateral calf looks better. Improved dimensions. Surface of this looks better. We've been maintaining him and Kerlix Coban wraps. He finds this much more comfortable. Silver collagen dressing 11/11/17; left lateral Wound continues to look healthy be making progress. Using a #5 curet I removed removed nonviable skin from the surface of the wound and then necrotic debris from the wound surface. Surface of the wound continues to look healthy. He also has an open area on the left great toenail bed. We've been using topical antibiotics. 11/19/17; left anterior lateral wound continues to look healthy but it's not closed. He also had a small wound above this on the left leg Initially traumatic wounds in the setting of significant chronic venous insufficiency and stasis dermatitis 11/25/17; left anterior wounds superiorly is closed still a small wound inferiorly. 12/02/17; left anterior tibial area. Arrives today with adherent callus. Post debridement clearly not completely closed. Hydrofera Blue under 3 layer compression. 12/09/17; left anterior tibia. Circumferential eschar however the wound bed looks stable to improved. We've been using Hydrofera Blue under 3 layer compression 12/17/17; left anterior tibia. Apparently this was felt to be closed however when the wrap was taken off  there is a skin tear to reopen wounds in the same area we've been using Hydrofera Blue under 3 layer compression 12/23/17 left anterior tibia. Not close to close this week apparently the [facility] was stuck to this again. Still circumferential eschar requiring debridement. I put a contact layer on this this time under the Hydrofera Blue 12/31/17; left anterior tibia. Wound is better slight amount of hyper-granulation. Using Hydrofera Blue over Adaptic. 01/07/18; left anterior tibia. The wound had some surface eschar however after this was removed he has no open wound.he was already revascularized by Dr. [name] when he came to our clinic with atherectomy of the left SFA and popliteal artery. He was also sent to interventional radiology for venous reflux studies. He was not felt to have significant reflux but certainly has chronic venous changes of his skin with hemosiderin deposition around this area. He will definitely need to lubricate his skin and wear compression stocking and I've talked to him about this. READMISSION 05/26/2018 This is a now 70 year old man we cared for with traumatic wounds on his left anterior lower extremity. He had been previously revascularized during that admission by Dr. [name]. Apparently in follow-up Dr. [name] noted that he had deterioration in his arterial status. He underwent a stent placement in the distal left SFA on 04/22/2018. 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 He went back to the angiography suite on 04/30/2018 he underwent PTA and balloon angioplasty of the occluded left mid anterior tibial artery, thrombotic occlusion went from 100 to 0% which reconstitutes the posterior tibial artery. He had thrombectomy and aspiration of the peroneal artery. The stent placed in the distal SFA left SFA was still occluded. He was discharged on Xarelto, it was noted on the discharge summary from this hospitalization that  he had gangrene at the tip of his left  fifth toe and there were expectations this would auto amputate. 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 He has been recuperating at [facility] in [facility] after the most recent hospitalization. He is going home tomorrow. He tells me that 2 weeks ago he traumatized the tip of his left fifth toe. He came in urgently for our review of this. This was a history of before I noted that Dr. [name] had already noted dry gangrenous changes of the left fifth toe 06/09/2018; 2-week follow-up. I did contact Dr. [name] after his last appointment and he apparently saw 1 of Dr. Irven Shelling colleagues the next day. He does not follow-up with Dr. [name] himself until Thursday of this week. 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 Nevertheless there is no evidence of infection no drainage and no pain. He had a new area that this week when we were signing him in today on the left anterior mid tibia area, this is in close proximity to the previous wound we have dealt with in this clinic. 06/23/2018; 2-week follow-up. I did not receive a recent note from Dr. [name] to review today. Our office is trying to obtain this. He is apparently not planning to do further vascular interventions and wondered about compression to try and help with the patient's chronic venous insufficiency. However we are also concerned about the arterial flow. He arrives in clinic today with a new area on the left third toe. The areas on the calf/anterior tibia are close to closing. The left fifth toe is still mummified using Betadine. -In reviewing things with the patient he has what sounds like claudication with mild to moderate amount of activity. 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 I prescribed Levaquin over the phone while we attempted to arrange a plan of care. However the patient called yesterday to report he had low-grade fever  and he came in today acutely. There is been a marked deterioration in the left third toe with spreading cellulitis up into the dorsal left foot. He was referred to the emergency room. Readmission: 06/29/2020 patient presents today for reevaluation here in our clinic he was previously treated by Dr. Dellia Nims at the latter part of 2019 in 2 the beginning of 2020. Subsequently we have not seen him since that time in the interim he did have evaluation with vein and vascular specialist specifically Dr. Anice Paganini who did perform quite extensive work for a left femoral to anterior tibial artery bypass. With that being said in the interim the patient has developed significant lymphedema and has wounds that he tells me have really never healed in regard to the incision site on the left leg. He also has multiple wounds on the feet for various reasons some of which is that he tends to pick at his feet. Fortunately there is no signs of active infection systemically at this time he does have some wounds that are little bit deeper but most are fairly superficial he seems to have good blood flow and overall everything appears to be healthy I see no bone exposed and no obvious signs of osteomyelitis. I do not know that he necessarily needs a x-ray at this point although that something we could consider depending on how things progress. The patient does have a history of lymphedema, diabetes, this is  type II, chronic kidney disease stage III, hypertension, and history of peripheral vascular disease. 07/05/2020; patient admitted last week. Is a patient I remember from 2019 he had a spreading infection involving the left foot and we sent him to the hospital. 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 He subsequently had a left femoral to anterior tibial bypass by Dr.Cain vein and vascular. He also has severe lymphedema with chronic skin changes related to that on the left leg. The  most problematic area that was new today was on the left medial great toe. This was apparently a small area last week there was purulent drainage which our intake nurse cultured. Also areas on the left medial foot and heel left lateral foot. He has 2 areas on the left medial calf left lateral calf in the setting of the severe lymphedema. 07/13/2020 on evaluation today patient appears to be doing better in my opinion compared to his last visit. The good news is there is no signs of active infection systemically and locally I do not see any signs of infection either. He did have an x-ray which was negative that is great news he had a culture which showed MRSA but at the same time he is been on the doxycycline which has helped. I do think we may want to extend this for 7 additional days 1/25; patient admitted to the clinic a few weeks ago. 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 We have been putting him under compression his edema control is a lot better but he is severe verricused skin on the left leg. He is really done quite well he still has an open area on the left medial calf and the left medial first metatarsal head. We have been using silver collagen on the leg silver alginate on the foot 07/27/2020 upon evaluation today patient appears to be doing decently well in regard to his wounds. He still has a lot of dry skin on the left leg. Some of this is starting to peel back and I think he may be able to have them out by removing some that today. Fortunately there is no signs of active infection at this time on the left leg although on the right leg he does appear to have swelling and erythema as well as some mild warmth to touch. This does have been concerned about the possibility of cellulitis although within the differential diagnosis I do think that potentially a DVT has to be at least considered. We need to rule that out before proceeding would just call  in the cellulitis. Especially since he is having pain in the posterior aspect of his calf muscle. 2/8; the patient had seen sparingly. He has severe skin changes of chronic lymphedema in the left leg thickened hyperkeratotic verrucous skin. He has an open wound on the medial part of the left first met head left mid tibia. He also has a rim of nonepithelialized skin in the anterior mid tibia. He brought in the AmLactin lotion that was been prescribed although I am not sure under compression and its utility. There concern about cellulitis on the right lower leg the last time he was here. He was put on on antibiotics. His DVT rule out was negative. The right leg looks fine he is using his stocking on this area 08/10/2020 upon evaluation today patient appears to be doing well with regard to his leg currently.  He has been tolerating the dressing changes without complication. Fortunately there is no signs of active infection which is great news. Overall very pleased with where things stand. 2/22; the patient still has an area on the medial part of the left first met his head. This looks better than when I last saw this earlier this month he has a rim of epithelialization but still some surface debris. Mostly everything on the left leg is healed. There is still a vulnerable in the left mid tibia area. Electronic Signature(s) Signed: 08/16/2020 5:22:30 PM By: Linton Ham MD Entered By: Linton Ham on 08/16/2020 11:10:48 -------------------------------------------------------------------------------- Physical Exam Details Patient Name: Date of Service: [name] 08/16/2020 9:15 A M Medical Record Number: [id number] Patient Account Number: [id number] Date of Birth/Sex: Treating RN: 1950-07-25 (70 y.o. [name] Primary Care Provider: [name] Other Clinician: Referring Provider: Treating Provider/Extender: Stormy Card in Treatment:  6 Constitutional Patient is hypertensive.. Pulse regular and within target range for patient.Marland Kitchen Respirations regular, non-labored and within target range.. Temperature is normal and within the target range for the patient.Marland Kitchen Appears in no distress. Integumentary (Hair, Skin) Still the dry vericused skin. Notes Wound exam; the area on the left plantar foot looks a lot better than the last time I saw this. Still fibrinous debris on the surface which I debrided with a #5 curette hemostasis with direct pressure. Minimal to no open areas on the left anterior lower leg there is still a vulnerable area that I am going to dress and wrap the leg but we may be able to transition him into a stocking next week Electronic Signature(s) Signed: 08/16/2020 5:22:30 PM By: Linton Ham MD Entered By: Linton Ham on 08/16/2020 11:12:18 -------------------------------------------------------------------------------- Physician Orders Details Patient Name: Date of Service: [name] 08/16/2020 9:15 A M Medical Record Number: [id number] Patient Account Number: [id number] Date of Birth/Sex: Treating RN: 1951-05-07 (69 y.o. [name] Primary Care Provider: [name] Other Clinician: Referring Provider: Treating Provider/Extender: Stormy Card in Treatment: 6 Verbal / Phone Orders: No Diagnosis Coding Follow-up Appointments Return Appointment in 1 week. Bathing/ Shower/ Hygiene May shower with protection but do not get wound dressing(s) wet. - use cast protector to cover dressing in the shower Edema Control - Lymphedema / SCD / Other Bilateral Lower Extremities Elevate legs to the level of the heart or above for 30 minutes daily and/or when sitting, a frequency of: - throughout the day Avoid standing for long periods of time. Exercise regularly Moisturize legs daily. - right leg Compression stocking or Garment 20-30 mm/Hg pressure to: - right leg  daily Off-Loading Other: - healing sandal left foot. Non Wound Condition Other Non Wound Condition Orders/Instructions: - apply calcium alginate with silver to small dry skin areas on left ant. leg. Wound Treatment Wound #13 - Foot Wound Laterality: Left, Medial, Distal Cleanser: Soap and Water 1 x Per Week Discharge Instructions: May shower and wash wound with dial antibacterial soap and water prior to dressing change. Peri-Wound Care: ammonium lactate 1 x Per Week Discharge Instructions: Apply to thickened skin on leg Prim Dressing: Hydrofera Blue Classic Foam, 2x2 in 1 x Per Week ary Discharge Instructions: Moisten with saline prior to applying to wound bed Secondary Dressing: Woven Gauze Sponge, Non-Sterile 4x4 in 1 x Per Week Discharge Instructions: Apply over primary dressing as directed. Compression Wrap: FourPress (4 layer compression wrap) 1 x Per Week Discharge Instructions: Apply four layer compression as directed. Electronic  Signature(s) Signed: 08/16/2020 5:22:30 PM By: Linton Ham MD Signed: 08/16/2020 5:43:17 PM By: Rhae Hammock RN Entered By: Rhae Hammock on 08/16/2020 10:40:31 -------------------------------------------------------------------------------- Problem List Details Patient Name: Date of Service: [name] 08/16/2020 9:15 A M Medical Record Number: [id number] Patient Account Number: [id number] Date of Birth/Sex: Treating RN: 1951/02/03 (70 y.o. [name], [name] Primary Care Provider: [name] Other Clinician: Referring Provider: Treating Provider/Extender: Stormy Card in Treatment: 6 Active Problems ICD-10 Encounter Code Description Active Date MDM Diagnosis E11.621 Type 2 diabetes mellitus with foot ulcer 06/29/2020 No Yes I89.0 Lymphedema, not elsewhere classified 06/29/2020 No Yes L97.522 Non-pressure chronic ulcer of other part of left foot with fat layer exposed 06/29/2020 No Yes I73.89 Other  specified peripheral vascular diseases 06/29/2020 No Yes I10 Essential (primary) hypertension 06/29/2020 No Yes N18.30 Chronic kidney disease, stage 3 unspecified 06/29/2020 No Yes L84 Corns and callosities 08/10/2020 No Yes Inactive Problems ICD-10 Code Description Active Date Inactive Date L97.822 Non-pressure chronic ulcer of other part of left lower leg with fat layer exposed 06/29/2020 06/29/2020 Resolved Problems Electronic Signature(s) Signed: 08/16/2020 5:22:30 PM By: Linton Ham MD Entered By: Linton Ham on 08/16/2020 11:06:25 -------------------------------------------------------------------------------- Progress Note Details Patient Name: Date of Service: [name] 08/16/2020 9:15 A M Medical Record Number: [id number] Patient Account Number: [id number] Date of Birth/Sex: Treating RN: April 15, 1951 (70 y.o. [name] Primary Care Provider: [name] Other Clinician: Referring Provider: Treating Provider/Extender: Stormy Card in Treatment: 6 Subjective History of Present Illness (HPI) 10/11/17; Mr. Cain is a 70 year old man who tells me that in 2015 he slipped down the latter traumatizing his left leg. He developed a wound in the same spot the area that we are currently looking at. He states this closed over for the most part although he always felt it was somewhat unstable. In 2016 he hit the same area with the door of his car had this reopened. He tells me that this is never really closed although sometimes an inflow it remains open on a constant basis. He has not been using any specific dressing to this except for topical antibiotics the nature of which were not really sure. His primary doctor did send him to see Dr. [name] of interventional cardiology. He underwent an angiogram on 08/06/17 and he underwent a PTA and directional atherectomy of the lesser distal SFA and popliteal arteries which resulted in brisk improvement in blood flow. It  was noted that he had 2 vessel runoff through the anterior tibial and peroneal. He is also been to see vascular and interventional radiologist. He was not felt to have any significant superficial venous insufficiency. Presumably is not a candidate for any ablation. It was suggested he come here for wound care. The patient is a type II diabetic on insulin. He also has a history of venous insufficiency. ABIs on the left were noncompressible in our clinic 10/21/17; patient we admitted to the clinic last week. 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 We put Iodosorb on him after an aggressive debridement and 3 layer compression. He complained of pain in his ankle and itching with is skin in fact he scratched the area on the medial calf superiorly at the rim of our wraps and he has 2 small open areas in that location today which are new. I changed his primary dressing today to silver collagen. As noted he is already had  revascularization and does not have any significant superficial venous insufficiency that would be amenable to ablation 10/28/17; patient admitted to the clinic 2 weeks ago. He has a smaller Wound. Scratch injury from last week revealed. There is large wound over the tibial area. This is smaller. Granulation looks healthy. No need for debridement. 11/04/17; the wound on the left lateral calf looks better. Improved dimensions. Surface of this looks better. We've been maintaining him and Kerlix Coban wraps. He finds this much more comfortable. Silver collagen dressing 11/11/17; left lateral Wound continues to look healthy be making progress. Using a #5 curet I removed removed nonviable skin from the surface of the wound and then necrotic debris from the wound surface. Surface of the wound continues to look healthy. ooHe also has an open area on the left great toenail bed. We've been using topical antibiotics. 11/19/17; left  anterior lateral wound continues to look healthy but it's not closed. ooHe also had a small wound above this on the left leg ooInitially traumatic wounds in the setting of significant chronic venous insufficiency and stasis dermatitis 11/25/17; left anterior wounds superiorly is closed still a small wound inferiorly. 12/02/17; left anterior tibial area. Arrives today with adherent callus. Post debridement clearly not completely closed. Hydrofera Blue under 3 layer compression. 12/09/17; left anterior tibia. Circumferential eschar however the wound bed looks stable to improved. We've been using Hydrofera Blue under 3 layer compression 12/17/17; left anterior tibia. Apparently this was felt to be closed however when the wrap was taken off there is a skin tear to reopen wounds in the same area we've been using Hydrofera Blue under 3 layer compression 12/23/17 left anterior tibia. Not close to close this week apparently the [facility] was stuck to this again. Still circumferential eschar requiring debridement. I put a contact layer on this this time under the Hydrofera Blue 12/31/17; left anterior tibia. Wound is better slight amount of hyper-granulation. Using Hydrofera Blue over Adaptic. 01/07/18; left anterior tibia. The wound had some surface eschar however after this was removed he has no open wound.he was already revascularized by Dr. [name] when he came to our clinic with atherectomy of the left SFA and popliteal artery. He was also sent to interventional radiology for venous reflux studies. He was not felt to have significant reflux but certainly has chronic venous changes of his skin with hemosiderin deposition around this area. He will definitely need to lubricate his skin and wear compression stocking and I've talked to him about this. READMISSION 05/26/2018 This is a now 70 year old man we cared for with traumatic wounds on his left anterior lower extremity. He had been previously revascularized  during that admission by Dr. [name]. Apparently in follow-up Dr. [name] noted that he had deterioration in his arterial status. He underwent a stent placement in the distal left SFA on 04/22/2018. 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 He went back to the angiography suite on 04/30/2018 he underwent PTA and balloon angioplasty of the occluded left mid anterior tibial artery, thrombotic occlusion went from 100 to 0% which reconstitutes the posterior tibial artery. He had thrombectomy and aspiration of the peroneal artery. The stent placed in the distal SFA left SFA was still occluded. 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 Noninvasive studies on 05/02/2018 showed an TBI on the left at 0.43 and  0.82 on the right. He has been recuperating at [place] home in [facility] after the most recent hospitalization. He is going home tomorrow. He tells me that 2 weeks ago he traumatized the tip of his left fifth toe. He came in urgently for our review of this. This was a history of before I noted that Dr. [name] had already noted dry gangrenous changes of the left fifth toe 06/09/2018; 2-week follow-up. I did contact Dr. [name] after his last appointment and he apparently saw 1 of Dr. Irven Shelling colleagues the next day. He does not follow-up with Dr. [name] himself until Thursday of this week. 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 Nevertheless there is no evidence of infection no drainage and no pain. He had a new area that this week when we were signing him in today on the left anterior mid tibia area, this is in close proximity to the previous wound we have dealt with in this clinic. 06/23/2018; 2-week follow-up. I did not receive a recent note from Dr. [name] to review today. Our office is trying to obtain  this. He is apparently not planning to do further vascular interventions and wondered about compression to try and help with the patient's chronic venous insufficiency. However we are also concerned about the arterial flow. ooHe arrives in clinic today with a new area on the left third toe. The areas on the calf/anterior tibia are close to closing. The left fifth toe is still mummified using Betadine. -In reviewing things with the patient he has what sounds like claudication with mild to moderate amount of activity. 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 I prescribed Levaquin over the phone while we attempted to arrange a plan of care. However the patient called yesterday to report he had low-grade fever and he came in today acutely. There is been a marked deterioration in the left third toe with spreading cellulitis up into the dorsal left foot. He was referred to the emergency room. Readmission: 06/29/2020 patient presents today for reevaluation here in our clinic he was previously treated by Dr. Dellia Nims at the latter part of 2019 in 2 the beginning of 2020. Subsequently we have not seen him since that time in the interim he did have evaluation with vein and vascular specialist specifically Dr. Anice Paganini who did perform quite extensive work for a left femoral to anterior tibial artery bypass. With that being said in the interim the patient has developed significant lymphedema and has wounds that he tells me have really never healed in regard to the incision site on the left leg. He also has multiple wounds on the feet for various reasons some of which is that he tends to pick at his feet. Fortunately there is no signs of active infection systemically at this time he does have some wounds that are little bit deeper but most are fairly superficial he seems to have good blood flow and overall everything appears to be healthy I see no bone exposed and no obvious  signs of osteomyelitis. I do not know that he necessarily needs a x-ray at this point although that something we could consider depending on how things progress. The patient does have a history of lymphedema, diabetes, this is type II, chronic kidney disease stage III, hypertension, and history of peripheral vascular disease. 07/05/2020; patient admitted last week. Is a patient I remember from 2019 he had a spreading infection involving  the left foot and we sent him to the hospital. 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 He subsequently had a left femoral to anterior tibial bypass by Dr.Cain vein and vascular. He also has severe lymphedema with chronic skin changes related to that on the left leg. The most problematic area that was new today was on the left medial great toe. This was apparently a small area last week there was purulent drainage which our intake nurse cultured. Also areas on the left medial foot and heel left lateral foot. He has 2 areas on the left medial calf left lateral calf in the setting of the severe lymphedema. 07/13/2020 on evaluation today patient appears to be doing better in my opinion compared to his last visit. The good news is there is no signs of active infection systemically and locally I do not see any signs of infection either. He did have an x-ray which was negative that is great news he had a culture which showed MRSA but at the same time he is been on the doxycycline which has helped. I do think we may want to extend this for 7 additional days 1/25; patient admitted to the clinic a few weeks ago. 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 We have been putting him under compression his edema control is a lot better but he is severe verricused skin on the left leg. He is really done quite well he still has an open area on the left medial calf and the left medial first metatarsal  head. We have been using silver collagen on the leg silver alginate on the foot 07/27/2020 upon evaluation today patient appears to be doing decently well in regard to his wounds. He still has a lot of dry skin on the left leg. Some of this is starting to peel back and I think he may be able to have them out by removing some that today. Fortunately there is no signs of active infection at this time on the left leg although on the right leg he does appear to have swelling and erythema as well as some mild warmth to touch. This does have been concerned about the possibility of cellulitis although within the differential diagnosis I do think that potentially a DVT has to be at least considered. We need to rule that out before proceeding would just call in the cellulitis. Especially since he is having pain in the posterior aspect of his calf muscle. 2/8; the patient had seen sparingly. He has severe skin changes of chronic lymphedema in the left leg thickened hyperkeratotic verrucous skin. He has an open wound on the medial part of the left first met head left mid tibia. He also has a rim of nonepithelialized skin in the anterior mid tibia. He brought in the AmLactin lotion that was been prescribed although I am not sure under compression and its utility. There concern about cellulitis on the right lower leg the last time he was here. He was put on on antibiotics. His DVT rule out was negative. The right leg looks fine he is using his stocking on this area 08/10/2020 upon evaluation today patient appears to be doing well with regard to his leg currently. He has been tolerating the dressing changes without complication. Fortunately there is no signs of active infection which is great news. Overall very pleased with where things stand. 2/22; the patient still has  an area on the medial part of the left first met his head. This looks better than when I last saw this earlier this month he has a rim  of epithelialization but still some surface debris. Mostly everything on the left leg is healed. There is still a vulnerable in the left mid tibia area. Objective Constitutional Patient is hypertensive.. Pulse regular and within target range for patient.Marland Kitchen Respirations regular, non-labored and within target range.. Temperature is normal and within the target range for the patient.Marland Kitchen Appears in no distress. Vitals Time Taken: 9:33 AM, Height: 73 in, Weight: 245 lbs, BMI: 32.3, Temperature: 98.4 F, Pulse: 60 bpm, Respiratory Rate: 18 breaths/min, Blood Pressure: 145/90 mmHg. General Notes: Wound exam; the area on the left plantar foot looks a lot better than the last time I saw this. Still fibrinous debris on the surface which I debrided with a #5 curette hemostasis with direct pressure. ooMinimal to no open areas on the left anterior lower leg there is still a vulnerable area that I am going to dress and wrap the leg but we may be able to transition him into a stocking next week Integumentary (Hair, Skin) Still the dry vericused skin. Wound #13 status is Open. Original cause of wound was Trauma. The date acquired was: 05/25/2020. The wound has been in treatment 6 weeks. The wound is located on the Left,Distal,Medial Foot. The wound measures 1.1cm length x 1.2cm width x 0.1cm depth; 1.037cm^2 area and 0.104cm^3 volume. There is Fat Layer (Subcutaneous Tissue) exposed. There is no tunneling or undermining noted. There is a medium amount of serosanguineous drainage noted. The wound margin is distinct with the outline attached to the wound base. There is medium (34-66%) pink granulation within the wound bed. There is a medium (34-66%) amount of necrotic tissue within the wound bed including Adherent Slough. Assessment Active Problems ICD-10 Type 2 diabetes mellitus with foot ulcer Lymphedema, not elsewhere classified Non-pressure chronic ulcer of other part of left foot with fat layer  exposed Other specified peripheral vascular diseases Essential (primary) hypertension Chronic kidney disease, stage 3 unspecified Corns and callosities Procedures Wound #13 Pre-procedure diagnosis of Wound #13 is a Diabetic Wound/Ulcer of the Lower Extremity located on the Left,Distal,Medial Foot .Severity of Tissue Pre Debridement is: Fat layer exposed. There was a Excisional Skin/Subcutaneous Tissue Debridement with a total area of 1.32 sq cm performed by Ricard Dillon., MD. With the following instrument(s): Curette to remove Viable and Non-Viable tissue/material. Material removed includes Callus, Subcutaneous Tissue, Skin: Dermis, and Skin: Epidermis after achieving pain control using Lidocaine. No specimens were taken. A time out was conducted at 10:37, prior to the start of the procedure. A Minimum amount of bleeding was controlled with Pressure. The procedure was tolerated well with a pain level of 0 throughout and a pain level of 0 following the procedure. Post Debridement Measurements: 1.1cm length x 1.2cm width x 0.1cm depth; 0.104cm^3 volume. Character of Wound/Ulcer Post Debridement is improved. Severity of Tissue Post Debridement is: Fat layer exposed. Post procedure Diagnosis Wound #13: Same as Pre-Procedure Pre-procedure diagnosis of Wound #13 is a Diabetic Wound/Ulcer of the Lower Extremity located on the Left,Distal,Medial Foot . There was a Four Layer Compression Therapy Procedure by Rhae Hammock, RN. Post procedure Diagnosis Wound #13: Same as Pre-Procedure Plan Follow-up Appointments: Return Appointment in 1 week. Bathing/ Shower/ Hygiene: May shower with protection but do not get wound dressing(s) wet. - use cast protector to cover dressing in the shower Edema Control - Lymphedema /  SCD / Other: Elevate legs to the level of the heart or above for 30 minutes daily and/or when sitting, a frequency of: - throughout the day Avoid standing for long periods of  time. Exercise regularly Moisturize legs daily. - right leg Compression stocking or Garment 20-30 mm/Hg pressure to: - right leg daily Off-Loading: Other: - healing sandal left foot. Non Wound Condition: Other Non Wound Condition Orders/Instructions: - apply calcium alginate with silver to small dry skin areas on left ant. leg. WOUND #13: - Foot Wound Laterality: Left, Medial, Distal Cleanser: Soap and Water 1 x Per Week/ Discharge Instructions: May shower and wash wound with dial antibacterial soap and water prior to dressing change. Peri-Wound Care: ammonium lactate 1 x Per Week/ Discharge Instructions: Apply to thickened skin on leg Prim Dressing: Hydrofera Blue Classic Foam, 2x2 in 1 x Per Week/ ary Discharge Instructions: Moisten with saline prior to applying to wound bed Secondary Dressing: Woven Gauze Sponge, Non-Sterile 4x4 in 1 x Per Week/ Discharge Instructions: Apply over primary dressing as directed. Com pression Wrap: FourPress (4 layer compression wrap) 1 x Per Week/ Discharge Instructions: Apply four layer compression as directed. 1. I change the dressing on the bottom of his foot the Hydrofera Blue was the surface was somewhat dry. Recommend he use the hydrophilic blue classic 2. Still wrapping the left leg there is still some areas that looked a little vulnerable to me 3. Using a stocking on the right. 4. Look forward to seeing next week if everything is closed on his leg and then we can graduate him to the stocking and start using ammonium lactate on the skin. This really seems to have helped with the right leg Electronic Signature(s) Signed: 08/16/2020 5:22:30 PM By: Linton Ham MD Entered By: Linton Ham on 08/16/2020 11:14:10 -------------------------------------------------------------------------------- SuperBill Details Patient Name: Date of Service: [name] 08/16/2020 Medical Record Number: [id number] Patient Account Number: [id number] Date of  Birth/Sex: Treating RN: Nov 09, 1950 (70 y.o. [name], [name] Primary Care Provider: [name] Other Clinician: Referring Provider: Treating Provider/Extender: Stormy Card in Treatment: 6 Diagnosis Coding ICD-10 Codes Code Description [phone] Type 2 diabetes mellitus with foot ulcer I89.0 Lymphedema, not elsewhere classified L97.522 Non-pressure chronic ulcer of other part of left foot with fat layer exposed I73.89 Other specified peripheral vascular diseases I10 Essential (primary) hypertension N18.30 Chronic kidney disease, stage 3 unspecified L84 Corns and callosities Facility Procedures CPT4 Code: [id number] Description: 17356 - DEB SUBQ TISSUE 20 SQ CM/< ICD-10 Diagnosis Description L97.522 Non-pressure chronic ulcer of other part of left foot with fat layer exposed Modifier: Quantity: 1 Physician Procedures Electronic Signature(s) Signed: 08/16/2020 5:22:30 PM By: Linton Ham MD Entered By: Linton Ham on 08/16/2020 11:14:25

## 2020-08-22 IMAGING — MR MR FOOT*L* WO/W CM
5 of 10 series · 19 of 40 positions shown · IV contrast (gadavist)
Comparison: MRI 06/29/2018

CLINICAL DATA: Chronic ulcer. History of prior amputation.

EXAM:
MRI OF THE LEFT FOREFOOT WITHOUT AND WITH CONTRAST
TECHNIQUE: Multiplanar, multisequence MR imaging of the left foot was performed
both before and after administration of intravenous contrast.
CONTRAST:  10mL GADAVIST GADOBUTROL 1 MMOL/ML IV SOLN

[Series 5: T1 · axial · 3.0mm · 0.27mm/px · z∈[-119,-9]mm · 4 of 40 slices shown (1 of 2)]
[im 1/40]
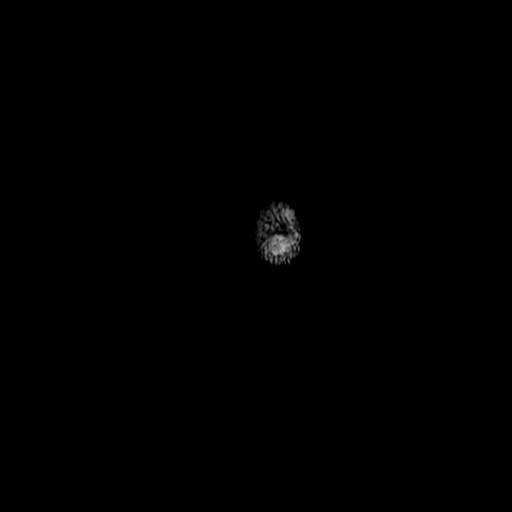
[im 14/40]
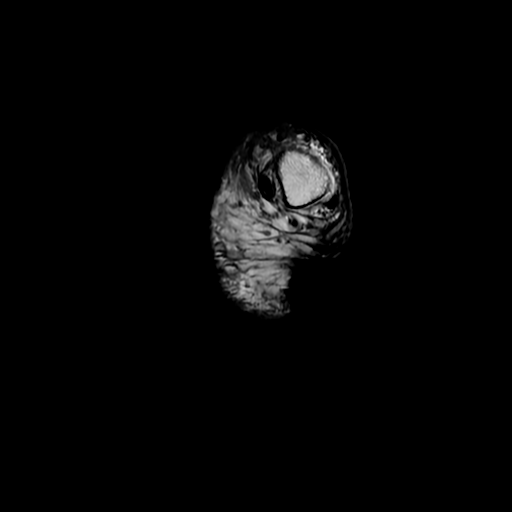
[im 27/40]
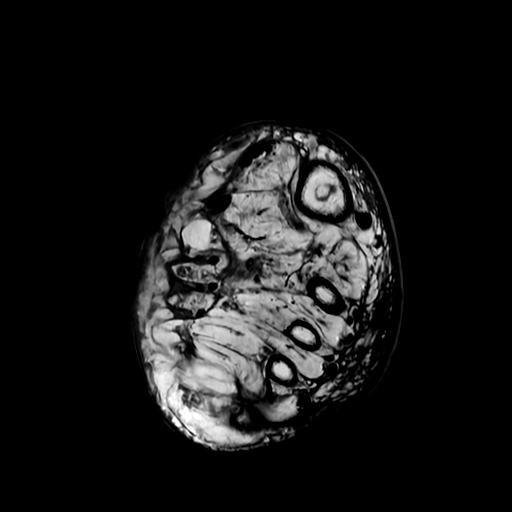
[im 40/40]
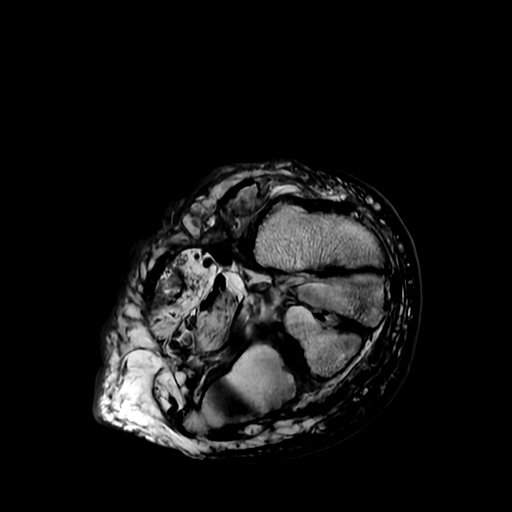

[Series 6: T1 fat-sat · axial · non-contrast · 3.0mm · 0.27mm/px · z∈[-119,-6]mm · 5 of 41 slices shown (1 of 2)]
[im 1/41]
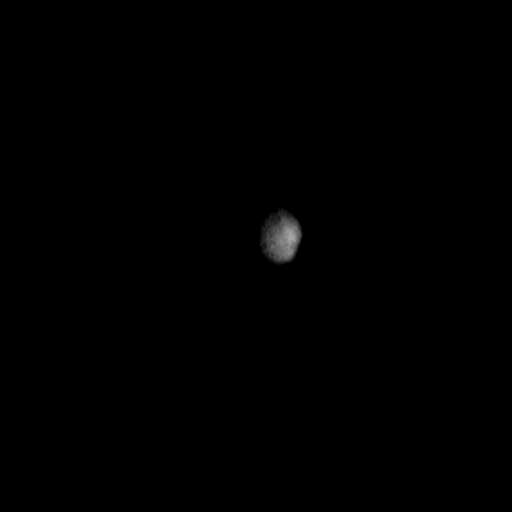
[im 11/41]
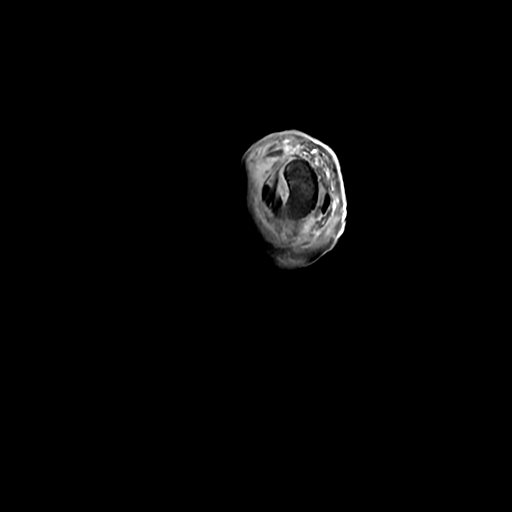
[im 21/41]
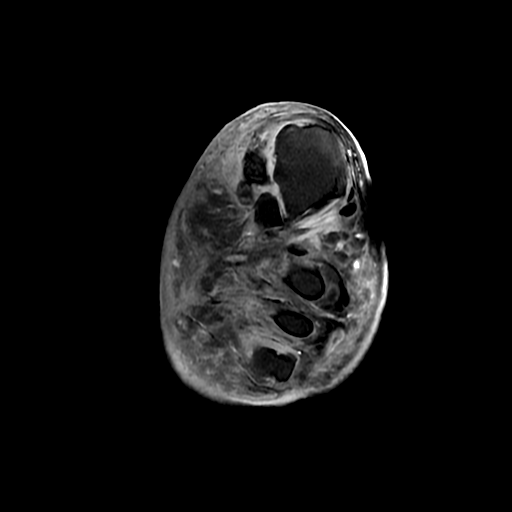
[im 31/41]
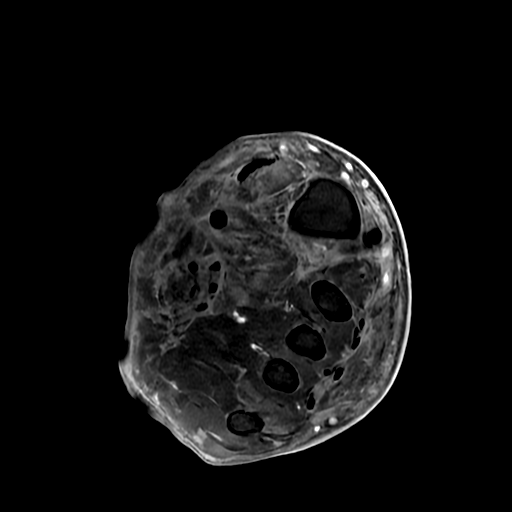
[im 41/41]
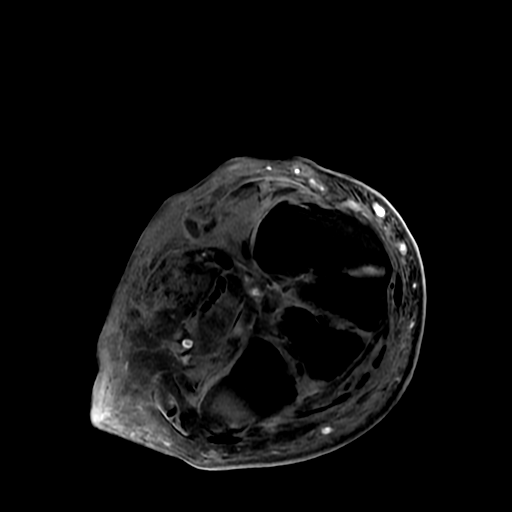

[Series 7: T2 fat-sat · axial · 3.0mm · 0.27mm/px · z∈[-119,-6]mm · 5 of 41 slices shown]
[im 1/41]
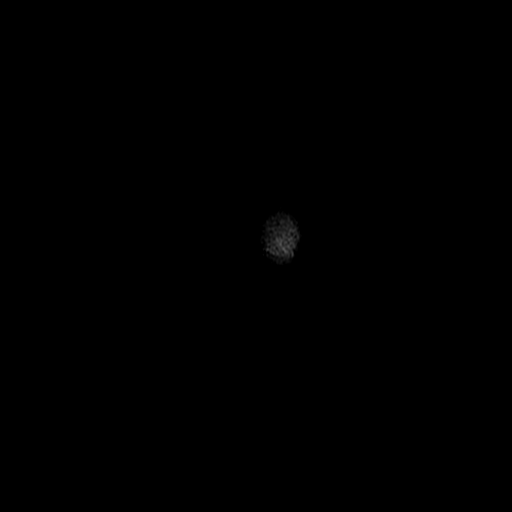
[im 11/41]
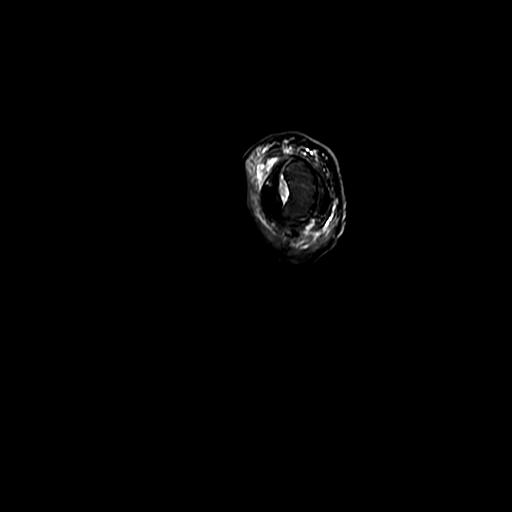
[im 21/41]
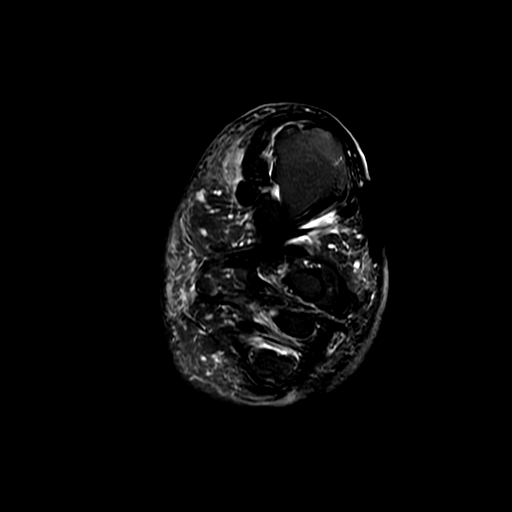
[im 31/41]
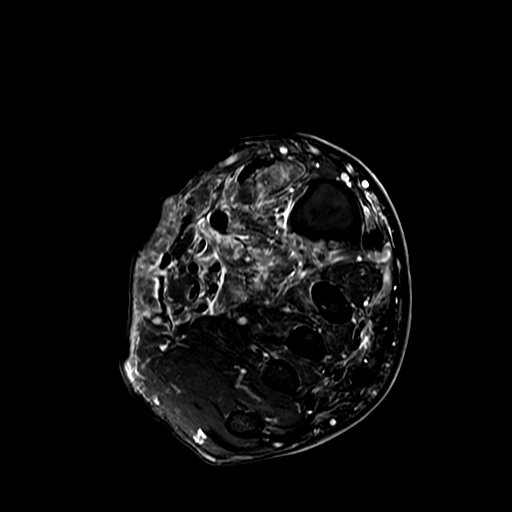
[im 41/41]
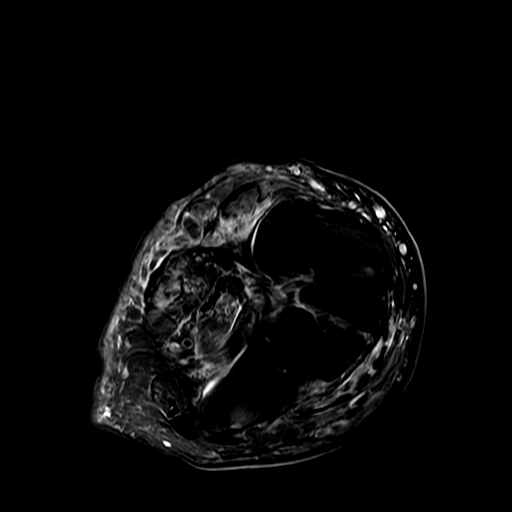

[Series 10: T1 · oblique · 3.0mm · 0.35mm/px · 3 of 24 slices shown (2 of 2)]
[im 1/24]
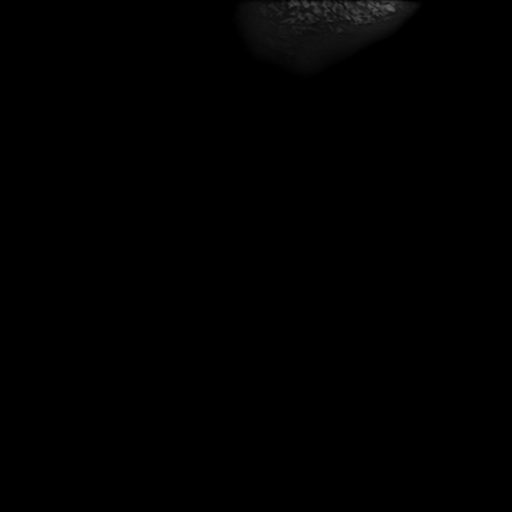
[im 12/24]
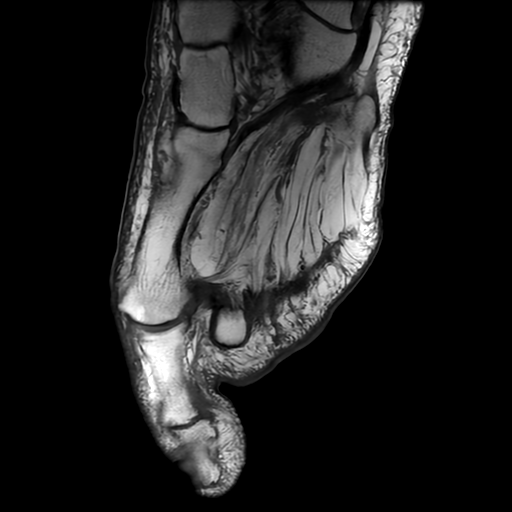
[im 24/24]
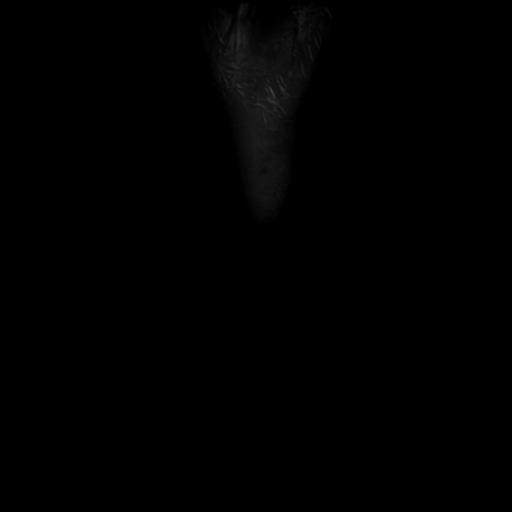

[Series 12: T1 fat-sat · axial · 3.0mm · 0.27mm/px · z∈[-119,-91]mm · 2 of 41 slices shown (2 of 2)]
[im 1/41]
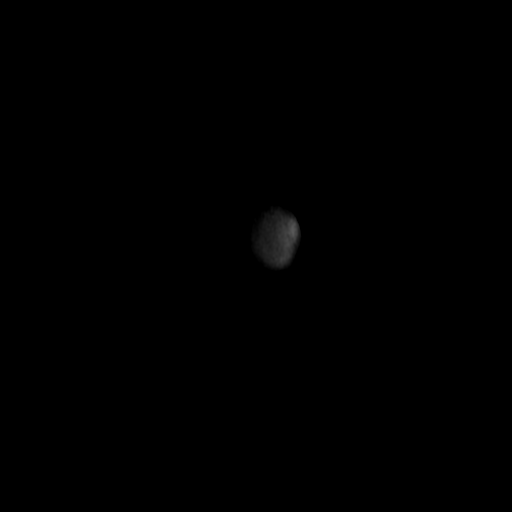
[im 11/41]
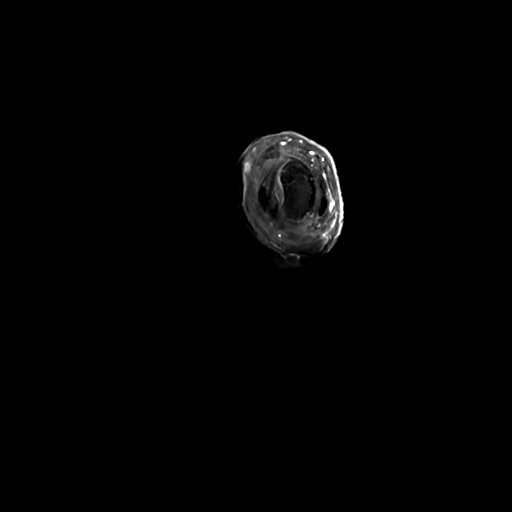

[19 of 40 positions shown; findings below may reference images not displayed]

FINDINGS: Amputation site noted at the second through fourth metatarsal heads
and the fifth mid metatarsal shaft. I do not see any findings
worrisome for osteomyelitis at the amputation sites. No findings
suspicious for septic arthritis. The midfoot bony structures are
intact. No worrisome arthropathic changes.

The great toe is intact.

Diffuse fatty change involving the foot musculature. No significant
changes of cellulitis and no soft tissue abscess is identified.
IMPRESSION: 1. Surgical changes from prior amputations at the second through
fourth metatarsal heads and the fifth mid metatarsal shaft. No
findings for osteomyelitis or soft tissue abscess.
2. Diffuse fatty change involving the foot musculature.

## 2020-08-24 ENCOUNTER — Encounter (HOSPITAL_BASED_OUTPATIENT_CLINIC_OR_DEPARTMENT_OTHER): Payer: BC Managed Care – PPO | Attending: Physician Assistant | Admitting: Physician Assistant

## 2020-08-24 ENCOUNTER — Other Ambulatory Visit (HOSPITAL_COMMUNITY)
Admission: RE | Admit: 2020-08-24 | Discharge: 2020-08-24 | Disposition: A | Discharge status: Home / Self Care | Payer: BC Managed Care – PPO | Source: Other Acute Inpatient Hospital | Attending: Physician Assistant | Admitting: Physician Assistant

## 2020-08-24 ENCOUNTER — Other Ambulatory Visit: Payer: Self-pay

## 2020-08-24 DIAGNOSIS — E1151 Type 2 diabetes mellitus with diabetic peripheral angiopathy without gangrene: Secondary | ICD-10-CM | POA: Insufficient documentation

## 2020-08-24 DIAGNOSIS — N183 Chronic kidney disease, stage 3 unspecified: Secondary | ICD-10-CM | POA: Diagnosis not present

## 2020-08-24 DIAGNOSIS — E1122 Type 2 diabetes mellitus with diabetic chronic kidney disease: Secondary | ICD-10-CM | POA: Diagnosis not present

## 2020-08-24 DIAGNOSIS — E1140 Type 2 diabetes mellitus with diabetic neuropathy, unspecified: Secondary | ICD-10-CM | POA: Diagnosis not present

## 2020-08-24 DIAGNOSIS — E11621 Type 2 diabetes mellitus with foot ulcer: Secondary | ICD-10-CM | POA: Diagnosis present

## 2020-08-24 DIAGNOSIS — I129 Hypertensive chronic kidney disease with stage 1 through stage 4 chronic kidney disease, or unspecified chronic kidney disease: Secondary | ICD-10-CM | POA: Diagnosis not present

## 2020-08-24 DIAGNOSIS — Z794 Long term (current) use of insulin: Secondary | ICD-10-CM | POA: Insufficient documentation

## 2020-08-24 DIAGNOSIS — I890 Lymphedema, not elsewhere classified: Secondary | ICD-10-CM | POA: Diagnosis not present

## 2020-08-24 DIAGNOSIS — L97522 Non-pressure chronic ulcer of other part of left foot with fat layer exposed: Secondary | ICD-10-CM | POA: Diagnosis not present

## 2020-08-25 NOTE — Progress Notes (Signed)
[name], [name] ([id number]) Visit Report for 08/24/2020 Arrival Information Details Patient Name: Date of Service: [name], [name] 08/24/2020 9:00 A M Medical Record Number: [id number] Patient Account Number: [id number] Date of Birth/Sex: Treating RN: [birth year] (70 y.o. [name], [name] Primary Care Tanylah Schnoebelen: [name] Other Clinician: Referring Julen Rubert: Treating Jaevon Paras/Extender: Myles Gip in Treatment: 8 Visit Information History Since Last Visit Added or deleted any medications: No Patient Arrived: Ambulatory Any new allergies or adverse reactions: No Arrival Time: 09:07 Had a fall or experienced change in No Accompanied By: self activities of daily living that may affect Transfer Assistance: None risk of falls: Patient Identification Verified: Yes Signs or symptoms of abuse/neglect since last visito No Secondary Verification Process Completed: Yes Hospitalized since last visit: No Patient Requires Transmission-Based No Implantable device outside of the clinic excluding No Precautions: cellular tissue based products placed in the center Patient Has Alerts: Yes since last visit: Patient Alerts: Patient on Blood Thinner Has Dressing in Place as Prescribed: Yes 01/29/20 LABI 1.17 TBI 0.88 Pain Present Now: Yes 01/29/20 R 1.23 Electronic Signature(s) Signed: 08/25/2020 6:02:38 PM By: Rhae Hammock RN Entered By: Rhae Hammock on 08/24/2020 09:08:30 -------------------------------------------------------------------------------- Compression Therapy Details Patient Name: Date of Service: [name]. 08/24/2020 9:00 A M Medical Record Number: [id number] Patient Account Number: [id number] Date of Birth/Sex: Treating RN: 11-07-1950 (69 y.o. [name], [name] Primary Care Raziya Aveni: [name] Other Clinician: Referring Keagan Anthis: Treating Iantha Titsworth/Extender: Myles Gip in Treatment: 8 Compression Therapy  Performed for Wound Assessment: Wound #13 Left,Distal,Medial Foot Performed By: Clinician Rhae Hammock, RN Compression Type: Four Layer Electronic Signature(s) Signed: 08/25/2020 6:02:38 PM By: Rhae Hammock RN Entered By: Rhae Hammock on 08/24/2020 09:59:16 -------------------------------------------------------------------------------- Encounter Discharge Information Details Patient Name: Date of Service: [name]. 08/24/2020 9:00 A M Medical Record Number: [id number] Patient Account Number: [id number] Date of Birth/Sex: Treating RN: 23-Oct-1950 (70 y.o. [name] Primary Care Charnice Zwilling: Other Clinician: [name] Referring Conya Ellinwood: Treating Dezeray Puccio/Extender: Myles Gip in Treatment: 8 Encounter Discharge Information Items Discharge Condition: Stable Ambulatory Status: Ambulatory Discharge Destination: Home Transportation: Private Auto Accompanied By: self Schedule Follow-up Appointment: Yes Clinical Summary of Care: Patient Declined Electronic Signature(s) Signed: 08/25/2020 6:02:38 PM By: Rhae Hammock RN Entered By: Rhae Hammock on 08/24/2020 10:04:15 -------------------------------------------------------------------------------- Lower Extremity Assessment Details Patient Name: Date of Service: [name]. 08/24/2020 9:00 A M Medical Record Number: [id number] Patient Account Number: [id number] Date of Birth/Sex: Treating RN: December 25, 1950 (69 y.o. [name], [name] Primary Care Undra Trembath: [name] Other Clinician: Referring Kymani Shimabukuro: Treating Kiyara Bouffard/Extender: Myles Gip in Treatment: 8 Edema Assessment Assessed: [name]: Yes] [name]: No] Edema: [Left: N] [Right: o] Calf Left: Right: Point of Measurement: 39 cm From Medial Instep 36 cm Ankle Left: Right: Point of Measurement: 12 cm From Medial Instep 24 cm Vascular Assessment Pulses: Dorsalis Pedis Palpable:  [Left:Yes] Posterior Tibial Palpable: [Left:Yes] Electronic Signature(s) Signed: 08/25/2020 6:02:38 PM By: Rhae Hammock RN Entered By: Rhae Hammock on 08/24/2020 09:17:00 -------------------------------------------------------------------------------- Pain Assessment Details Patient Name: Date of Service: [name]. 08/24/2020 9:00 A M Medical Record Number: [id number] Patient Account Number: [id number] Date of Birth/Sex: Treating RN: [birth year] (69 y.o. [name] Primary Care Kamarii Carton: Other Clinician: [name] Referring Deeksha Cotrell: Treating Ruhi Kopke/Extender: Myles Gip in Treatment: 8 Active Problems Location of Pain Severity and Description of Pain Patient Has Paino Yes Site Locations Pain Location:  Pain in Ulcers With Dressing Change: No Duration of the Pain. Constant / Intermittento Constant Rate the pain. Current Pain Level: 6 Worst Pain Level: 0 Least Pain Level: 0 Tolerable Pain Level: 6 Character of Pain Describe the Pain: Aching Pain Management and Medication Current Pain Management: Medication: Yes Cold Application: No Rest: Yes Massage: No Activity: No T.E.N.S.: No Heat Application: No Leg drop or elevation: No Is the Current Pain Management Adequate: Adequate How does your wound impact your activities of daily livingo Sleep: No Bathing: No Appetite: No Relationship With Others: No Bladder Continence: No Emotions: No Bowel Continence: No Work: No Toileting: No Drive: No Dressing: No Hobbies: No Electronic Signature(s) Signed: 08/25/2020 6:02:38 PM By: Rhae Hammock RN Entered By: Rhae Hammock on 08/24/2020 09:09:47 -------------------------------------------------------------------------------- Patient/Caregiver Education Details Patient Name: Date of Service: [name], [name] 3/2/2022andnbsp9:00 A M Medical Record Number: [id number] Patient Account Number: [id number] Date of  Birth/Gender: Treating RN: September 12, 1950 (69 y.o. [name] Primary Care Physician: [name] Other Clinician: Referring Physician: Treating Physician/Extender: Myles Gip in Treatment: 8 Education Assessment Education Provided To: Patient Education Topics Provided Venous: Methods: Explain/Verbal Responses: State content correctly Wound/Skin Impairment: Methods: Explain/Verbal Responses: State content correctly Electronic Signature(s) Signed: 08/25/2020 6:02:38 PM By: Rhae Hammock RN Entered By: Rhae Hammock on 08/24/2020 10:03:10 -------------------------------------------------------------------------------- Wound Assessment Details Patient Name: Date of Service: [name]. 08/24/2020 9:00 A M Medical Record Number: [id number] Patient Account Number: [id number] Date of Birth/Sex: Treating RN: 1951-05-21 (69 y.o. [name], [name] Primary Care Jakerra Floyd: [name] Other Clinician: Referring Madeliene Tejera: Treating Michoel Kunin/Extender: Myles Gip in Treatment: 8 Wound Status Wound Number: 13 Primary Diabetic Wound/Ulcer of the Lower Extremity Etiology: Wound Location: Left, Distal, Medial Foot Wound Open Wounding Event: Trauma Status: Date Acquired: 05/25/2020 Comorbid Glaucoma, Sleep Apnea, Hypertension, Peripheral Arterial Disease, Weeks Of Treatment: 8 History: Peripheral Venous Disease, Type II Diabetes, Gout, Osteoarthritis, Clustered Wound: No Neuropathy Photos Wound Measurements Length: (cm) 1.1 Width: (cm) 2.3 Depth: (cm) 0.2 Area: (cm) 1.987 Volume: (cm) 0.397 % Reduction in Area: -741.9% % Reduction in Volume: -1554.2% Epithelialization: Medium (34-66%) Tunneling: Yes Position (o'clock): 9 Maximum Distance: (cm) 0.7 Undermining: No Wound Description Classification: Grade 2 Wound Margin: Distinct, outline attached Exudate Amount: Medium Exudate Type: Purulent Exudate Color:  yellow, brown, green Foul Odor After Cleansing: No Slough/Fibrino Yes Wound Bed Granulation Amount: Medium (34-66%) Exposed Structure Granulation Quality: Pink Fascia Exposed: No Necrotic Amount: Medium (34-66%) Fat Layer (Subcutaneous Tissue) Exposed: Yes Necrotic Quality: Adherent Slough Tendon Exposed: No Muscle Exposed: No Joint Exposed: No Bone Exposed: No Treatment Notes Wound #13 (Foot) Wound Laterality: Left, Medial, Distal Cleanser Soap and Water Discharge Instruction: May shower and wash wound with dial antibacterial soap and water prior to dressing change. Peri-Wound Care ammonium lactate Discharge Instruction: Apply to thickened skin on leg Topical Primary Dressing Hydrofera Blue Classic Foam, 2x2 in Discharge Instruction: Moisten with saline prior to applying to wound bed Secondary Dressing Woven Gauze Sponge, Non-Sterile 4x4 in Discharge Instruction: Apply over primary dressing as directed. Secured With Compression Wrap FourPress (4 layer compression wrap) Discharge Instruction: Apply four layer compression as directed. Compression Stockings Add-Ons Electronic Signature(s) Signed: 08/25/2020 5:30:41 PM By: Sandre Kitty Signed: 08/25/2020 6:02:38 PM By: Rhae Hammock RN Entered By: Sandre Kitty on 08/25/2020 17:17:54 -------------------------------------------------------------------------------- Vitals Details Patient Name: Date of Service: [name]. 08/24/2020 9:00 A M Medical Record Number: [id number] Patient Account Number: [id number] Date of Birth/Sex: Treating RN:  08/22/1950 (70 y.o. [name], [name] Primary Care Elica Almas: [name] Other Clinician: Referring Sophiah Rolin: Treating Ubah Radke/Extender: Myles Gip in Treatment: 8 Vital Signs Time Taken: 09:08 Respiratory Rate (breaths/min): 17 Height (in): 73 Reference Range: 80 - 120 mg / dl Weight (lbs): 245 Body Mass Index (BMI): 32.3 Electronic  Signature(s) Signed: 08/25/2020 6:02:38 PM By: Rhae Hammock RN Entered By: Rhae Hammock on 08/24/2020 09:08:44

## 2020-08-25 NOTE — Progress Notes (Signed)
AYDN, FERRARA ([id number]) Visit Report for 08/24/2020 SuperBill Details Patient Name: Date of Service: [name], [name] 08/24/2020 Medical Record Number: [id number] Patient Account Number: [id number] Date of Birth/Sex: Treating RN: [birth year] (70 y.o. 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

## 2020-08-29 LAB — AEROBIC/ANAEROBIC CULTURE W GRAM STAIN (SURGICAL/DEEP WOUND): Gram Stain: NONE SEEN

## 2020-08-30 ENCOUNTER — Other Ambulatory Visit: Payer: Self-pay

## 2020-08-30 ENCOUNTER — Encounter (HOSPITAL_BASED_OUTPATIENT_CLINIC_OR_DEPARTMENT_OTHER): Payer: BC Managed Care – PPO | Admitting: Physician Assistant

## 2020-08-30 DIAGNOSIS — E11621 Type 2 diabetes mellitus with foot ulcer: Secondary | ICD-10-CM | POA: Diagnosis not present

## 2020-08-30 NOTE — Progress Notes (Addendum)
[name], [name] ([id number]) Visit Report for 08/30/2020 Chief Complaint Document Details Patient Name: Date of Service: [name], [name] 08/30/2020 2:45 PM Medical Record Number: [id number] Patient Account Number: [id number] Date of Birth/Sex: Treating RN: 10/05/1950 (70 y.o. [name], [name] Primary Care Provider: Jilda [name] Other Clinician: Referring Provider: Treating Provider/Extender: [name] in Treatment: 8 Information Obtained from: Patient Chief Complaint Left leg and foot ulcers Electronic Signature(s) Signed: 08/30/2020 2:54:17 PM By: [name] Keeler PA-C Entered By: [name] on 08/30/2020 14:54:16 -------------------------------------------------------------------------------- Debridement Details Patient Name: Date of Service: [name]. 08/30/2020 2:45 PM Medical Record Number: [id number] Patient Account Number: [id number] Date of Birth/Sex: Treating RN: 02-Aug-1950 (69 y.o. [name], [name] Primary Care Provider: Jilda [name] Other Clinician: Referring Provider: Treating Provider/Extender: [name] in Treatment: 8 Debridement Performed for Assessment: Wound #13 Left,Distal,Medial Foot Performed By: Physician [name] Keeler, PA Debridement Type: Debridement Severity of Tissue Pre Debridement: Fat layer exposed Level of Consciousness (Pre-procedure): Awake and Alert Pre-procedure Verification/Time Out Yes - 16:04 Taken: Start Time: 16:04 Pain Control: Lidocaine T Area Debrided (L x W): otal 1 (cm) x 2 (cm) = 2 (cm) Tissue and other material debrided: Viable, Non-Viable, Callus, Subcutaneous, Skin: Dermis , Skin: Epidermis Level: Skin/Subcutaneous Tissue Debridement Description: Excisional Instrument: Curette Bleeding: Minimum Hemostasis Achieved: Pressure End Time: 16:05 Procedural Pain: 0 Post Procedural Pain: 0 Response to Treatment: Procedure was tolerated well Level of Consciousness  (Post- Awake and Alert procedure): Post Debridement Measurements of Total Wound Length: (cm) 1 Width: (cm) 2 Depth: (cm) 0.2 Volume: (cm) 0.314 Character of Wound/Ulcer Post Debridement: Improved Severity of Tissue Post Debridement: Fat layer exposed Post Procedure Diagnosis Same as Pre-procedure Electronic Signature(s) Signed: 08/31/2020 8:28:17 AM By: [name] Keeler PA-C Signed: 08/31/2020 5:36:09 PM By: [name] Hammock RN Entered By: [name] on 08/30/2020 16:05:18 -------------------------------------------------------------------------------- HPI Details Patient Name: Date of Service: [name]. 08/30/2020 2:45 PM Medical Record Number: [id number] Patient Account Number: [id number] Date of Birth/Sex: Treating RN: [birth year] (70 y.o. [name] Primary Care Provider: Jilda [name] Other Clinician: Referring Provider: Treating Provider/Extender: [name] in Treatment: 8 History of Present Illness HPI Description: 10/11/17; Mr. [name] is a 70 year old man who tells me that in 2015 he slipped down the latter traumatizing his left leg. He developed a wound in the same spot the area that we are currently looking at. He states this closed over for the most part although he always felt it was somewhat unstable. In 2016 he hit the same area with the door of his car had this reopened. He tells me that this is never really closed although sometimes an inflow it remains open on a constant basis. He has not been using any specific dressing to this except for topical antibiotics the nature of which were not really sure. His primary doctor did send him to see Dr. Einar [name] of interventional cardiology. He underwent an angiogram on 08/06/17 and he underwent a PTA and directional atherectomy of the lesser distal SFA and popliteal arteries which resulted in brisk improvement in blood flow. It was noted that he had 2 vessel runoff through the anterior tibial  and peroneal. He is also been to see vascular and interventional radiologist. He was not felt to have any significant superficial venous insufficiency. Presumably is not a candidate for any ablation. It was suggested he come here for wound care. The patient is a type  II diabetic on insulin. He also has a history of venous insufficiency. ABIs on the left were noncompressible in our clinic 10/21/17; patient we admitted to the clinic last week. 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 We put Iodosorb on him after an aggressive debridement and 3 layer compression. He complained of pain in his ankle and itching with is skin in fact he scratched the area on the medial calf superiorly at the rim of our wraps and he has 2 small open areas in that location today which are new. I changed his primary dressing today to silver collagen. As noted he is already had revascularization and does not have any significant superficial venous insufficiency that would be amenable to ablation 10/28/17; patient admitted to the clinic 2 weeks ago. He has a smaller Wound. Scratch injury from last week revealed. There is large wound over the tibial area. This is smaller. Granulation looks healthy. No need for debridement. 11/04/17; the wound on the left lateral calf looks better. Improved dimensions. Surface of this looks better. We've been maintaining him and Kerlix Coban wraps. He finds this much more comfortable. Silver collagen dressing 11/11/17; left lateral Wound continues to look healthy be making progress. Using a #5 curet I removed removed nonviable skin from the surface of the wound and then necrotic debris from the wound surface. Surface of the wound continues to look healthy. He also has an open area on the left great toenail bed. We've been using topical antibiotics. 11/19/17; left anterior lateral wound continues to look healthy but it's not closed. He  also had a small wound above this on the left leg Initially traumatic wounds in the setting of significant chronic venous insufficiency and stasis dermatitis 11/25/17; left anterior wounds superiorly is closed still a small wound inferiorly. 12/02/17; left anterior tibial area. Arrives today with adherent callus. Post debridement clearly not completely closed. Hydrofera Blue under 3 layer compression. 12/09/17; left anterior tibia. Circumferential eschar however the wound bed looks stable to improved. We've been using Hydrofera Blue under 3 layer compression 12/17/17; left anterior tibia. Apparently this was felt to be closed however when the wrap was taken off there is a skin tear to reopen wounds in the same area we've been using Hydrofera Blue under 3 layer compression 12/23/17 left anterior tibia. Not close to close this week apparently the [facility] was stuck to this again. Still circumferential eschar requiring debridement. I put a contact layer on this this time under the Hydrofera Blue 12/31/17; left anterior tibia. Wound is better slight amount of hyper-granulation. Using Hydrofera Blue over Adaptic. 01/07/18; left anterior tibia. The wound had some surface eschar however after this was removed he has no open wound.he was already revascularized by Dr. Einar [name] when he came to our clinic with atherectomy of the left SFA and popliteal artery. He was also sent to interventional radiology for venous reflux studies. He was not felt to have significant reflux but certainly has chronic venous changes of his skin with hemosiderin deposition around this area. He will definitely need to lubricate his skin and wear compression stocking and I've talked to him about this. READMISSION 05/26/2018 This is a now 70 year old man we cared for with traumatic wounds on his left anterior lower extremity. He had been previously revascularized during that admission by Dr. Einar [name]. Apparently in follow-up Dr. Einar [name] noted  that he had deterioration in his arterial status. He underwent a stent  placement in the distal left SFA on 04/22/2018. 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 He went back to the angiography suite on 04/30/2018 he underwent PTA and balloon angioplasty of the occluded left mid anterior tibial artery, thrombotic occlusion went from 100 to 0% which reconstitutes the posterior tibial artery. He had thrombectomy and aspiration of the peroneal artery. The stent placed in the distal SFA left SFA was still occluded. 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 He has been recuperating at [facility] after the most recent hospitalization. He is going home tomorrow. He tells me that 2 weeks ago he traumatized the tip of his left fifth toe. He came in urgently for our review of this. This was a history of before I noted that Dr. Einar [name] had already noted dry gangrenous changes of the left fifth toe 06/09/2018; 2-week follow-up. I did contact Dr. Einar [name] after his last appointment and he apparently saw 1 of Dr. Irven Shelling colleagues the next day. He does not follow-up with Dr. Einar [name] himself until Thursday of this week. 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 Nevertheless there is no evidence of infection no drainage and no pain. He had a new area that this week when we were signing him in today on the left anterior mid tibia area, this is in close proximity to the previous wound we have dealt with in this clinic. 06/23/2018; 2-week follow-up. I did not receive a recent note from Dr. Einar [name] to review today. Our office is trying to obtain this. He is apparently not planning to do further vascular interventions and  wondered about compression to try and help with the patient's chronic venous insufficiency. However we are also concerned about the arterial flow. He arrives in clinic today with a new area on the left third toe. The areas on the calf/anterior tibia are close to closing. The left fifth toe is still mummified using Betadine. -In reviewing things with the patient he has what sounds like claudication with mild to moderate amount of activity. 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 I prescribed Levaquin over the phone while we attempted to arrange a plan of care. However the patient called yesterday to report he had low-grade fever and he came in today acutely. There is been a marked deterioration in the left third toe with spreading cellulitis up into the dorsal left foot. He was referred to the emergency room. Readmission: 06/29/2020 patient presents today for reevaluation here in our clinic he was previously treated by Dr. Dellia Nims at the latter part of 2019 in 2 the beginning of 2020. Subsequently we have not seen him since that time in the interim he did have evaluation with vein and vascular specialist specifically Dr. Anice Paganini who did perform quite extensive work for a left femoral to anterior tibial artery bypass. With that being said in the interim the patient has developed significant lymphedema and has wounds that he tells me have really never healed in regard to the incision site on the left leg. He also has multiple wounds on the feet for various reasons some of which is that he tends to pick at his feet.  Fortunately there is no signs of active infection systemically at this time he does have some wounds that are little bit deeper but most are fairly superficial he seems to have good blood flow and overall everything appears to be healthy I see no bone exposed and no obvious signs of osteomyelitis. I do not know that he necessarily needs a x-ray at this  point although that something we could consider depending on how things progress. The patient does have a history of lymphedema, diabetes, this is type II, chronic kidney disease stage III, hypertension, and history of peripheral vascular disease. 07/05/2020; patient admitted last week. Is a patient I remember from 2019 he had a spreading infection involving the left foot and we sent him to the hospital. 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 He subsequently had a left femoral to anterior tibial bypass by Dr.Cain vein and vascular. He also has severe lymphedema with chronic skin changes related to that on the left leg. The most problematic area that was new today was on the left medial great toe. This was apparently a small area last week there was purulent drainage which our intake nurse cultured. Also areas on the left medial foot and heel left lateral foot. He has 2 areas on the left medial calf left lateral calf in the setting of the severe lymphedema. 07/13/2020 on evaluation today patient appears to be doing better in my opinion compared to his last visit. The good news is there is no signs of active infection systemically and locally I do not see any signs of infection either. He did have an x-ray which was negative that is great news he had a culture which showed MRSA but at the same time he is been on the doxycycline which has helped. I do think we may want to extend this for 7 additional days 1/25; patient admitted to the clinic a few weeks ago. 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 We have been putting him under compression his edema control is a lot better but he is severe verricused skin on the left leg. He is really done quite well he still has an open area on the left medial calf and the left medial first metatarsal head. We have been using silver collagen on the leg silver alginate on the  foot 07/27/2020 upon evaluation today patient appears to be doing decently well in regard to his wounds. He still has a lot of dry skin on the left leg. Some of this is starting to peel back and I think he may be able to have them out by removing some that today. Fortunately there is no signs of active infection at this time on the left leg although on the right leg he does appear to have swelling and erythema as well as some mild warmth to touch. This does have been concerned about the possibility of cellulitis although within the differential diagnosis I do think that potentially a DVT has to be at least considered. We need to rule that out before proceeding would just call in the cellulitis. Especially since he is having pain in the posterior aspect of his calf muscle. 2/8; the patient had seen sparingly. He has severe skin changes of chronic lymphedema in the left leg thickened hyperkeratotic verrucous skin. He has an open wound on the medial part of the left first met head left mid tibia. He also  has a rim of nonepithelialized skin in the anterior mid tibia. He brought in the AmLactin lotion that was been prescribed although I am not sure under compression and its utility. There concern about cellulitis on the right lower leg the last time he was here. He was put on on antibiotics. His DVT rule out was negative. The right leg looks fine he is using his stocking on this area 08/10/2020 upon evaluation today patient appears to be doing well with regard to his leg currently. He has been tolerating the dressing changes without complication. Fortunately there is no signs of active infection which is great news. Overall very pleased with where things stand. 2/22; the patient still has an area on the medial part of the left first met his head. This looks better than when I last saw this earlier this month he has a rim of epithelialization but still some surface debris. Mostly everything on the left leg is  healed. There is still a vulnerable in the left mid tibia area. 08/30/2020 upon evaluation today patient appears to be doing much better in regard to his wounds on his foot. 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 Nonetheless I think we will address that today I Minna send in a prescription for him in that regard. Overall though there does not appear to be any signs of significant worsening. Electronic Signature(s) Signed: 08/31/2020 8:19:54 AM By: [name] Keeler PA-C Entered By: [name] on 08/31/2020 08:19:53 -------------------------------------------------------------------------------- Physical Exam Details Patient Name: Date of Service: ODYSSEUS, CADA 08/30/2020 2:45 PM Medical Record Number: [id number] Patient Account Number: [id number] Date of Birth/Sex: Treating RN: 1951-02-07 (70 y.o. [name] Primary Care Provider: Jilda [name] Other Clinician: Referring Provider: Treating Provider/Extender: [name] in Treatment: 8 Constitutional Well-nourished and well-hydrated in no acute distress. Respiratory normal breathing without difficulty. Psychiatric this patient is able to make decisions and demonstrates good insight into disease process. Alert and Oriented x 3. pleasant and cooperative. Notes Patient's wound did require sharp debridement and in the process of debriding I did uncover as well a fluid collection of pus underneath the medial portion of his great toe. The patient did have this area debrided away and post debridement most of this was new skin underneath although there was a very small opening nonetheless I think this will do quite well. Electronic Signature(s) Signed: 08/31/2020 8:20:18 AM By: [name] Keeler PA-C Entered By: [name] on 08/31/2020  08:20:18 -------------------------------------------------------------------------------- Physician Orders Details Patient Name: Date of Service: [name]. 08/30/2020 2:45 PM Medical Record Number: [id number] Patient Account Number: [id number] Date of Birth/Sex: Treating RN: [birth year] (69 y.o. [name], [name] Primary Care Provider: Jilda [name] Other Clinician: Referring Provider: Treating Provider/Extender: [name] in Treatment: 8 Verbal / Phone Orders: No Diagnosis Coding ICD-10 Coding Code Description E11.621 Type 2 diabetes mellitus with foot ulcer I89.0 Lymphedema, not elsewhere classified L97.522 Non-pressure chronic ulcer of other part of left foot with fat layer exposed I73.89 Other specified peripheral vascular diseases I10 Essential (primary) hypertension N18.30 Chronic kidney disease, stage 3 unspecified L84 Corns and callosities Follow-up Appointments Return Appointment in 1 week. Bathing/ Shower/ Hygiene May shower with protection but do not get wound dressing(s) wet. - use cast protector to cover dressing in the shower Edema Control -  Lymphedema / SCD / Other Bilateral Lower Extremities Elevate legs to the level of the heart or above for 30 minutes daily and/or when sitting, a frequency of: - throughout the day Avoid standing for long periods of time. Exercise regularly Moisturize legs daily. - right leg Compression stocking or Garment 20-30 mm/Hg pressure to: - right leg daily Off-Loading Other: - healing sandal left foot. Non Wound Condition Other Non Wound Condition Orders/Instructions: - apply calcium alginate with silver to small dry skin areas on left ant. leg. Wound Treatment Wound #13 - Foot Wound Laterality: Left, Medial, Distal Cleanser: Soap and Water 1 x Per Week Discharge Instructions: May shower and wash wound with dial antibacterial soap and water prior to dressing change. Peri-Wound Care: ammonium lactate 1 x  Per Week Discharge Instructions: Apply to thickened skin on leg Prim Dressing: Hydrofera Blue Classic Foam, 2x2 in 1 x Per Week ary Discharge Instructions: Moisten with saline prior to applying to wound bed Secondary Dressing: Woven Gauze Sponge, Non-Sterile 4x4 in 1 x Per Week Discharge Instructions: Apply over primary dressing as directed. Compression Wrap: FourPress (4 layer compression wrap) 1 x Per Week Discharge Instructions: Apply four layer compression as directed. Patient Medications llergies: No Known Drug Allergies A Notifications Medication Indication Start End 08/31/2020 Bactrim DS DOSE 1 - oral 800 mg-160 mg tablet - 1 tablet oral taken 2 times per day for 10 days Electronic Signature(s) Signed: 08/31/2020 6:05:55 PM By: [name] Keeler PA-C Previous Signature: 08/31/2020 8:28:17 AM Version By: [name] Keeler PA-C Previous Signature: 08/31/2020 5:36:09 PM Version By: [name] Hammock RN Entered By: [name] on 08/31/2020 18:05:54 -------------------------------------------------------------------------------- Problem List Details Patient Name: Date of Service: [name]. 08/30/2020 2:45 PM Medical Record Number: [id number] Patient Account Number: [id number] Date of Birth/Sex: Treating RN: 1950-12-01 (70 y.o. [name], [name] Primary Care Provider: Jilda [name] Other Clinician: Referring Provider: Treating Provider/Extender: [name] in Treatment: 8 Active Problems ICD-10 Encounter Code Description Active Date MDM Diagnosis E11.621 Type 2 diabetes mellitus with foot ulcer 06/29/2020 No Yes I89.0 Lymphedema, not elsewhere classified 06/29/2020 No Yes L97.522 Non-pressure chronic ulcer of other part of left foot with fat layer exposed 06/29/2020 No Yes I73.89 Other specified peripheral vascular diseases 06/29/2020 No Yes I10 Essential (primary) hypertension 06/29/2020 No Yes N18.30 Chronic kidney disease, stage 3 unspecified 06/29/2020 No  Yes L84 Corns and callosities 08/10/2020 No Yes Inactive Problems ICD-10 Code Description Active Date Inactive Date L97.822 Non-pressure chronic ulcer of other part of left lower leg with fat layer exposed 06/29/2020 06/29/2020 Resolved Problems Electronic Signature(s) Signed: 08/30/2020 2:54:08 PM By: [name] Keeler PA-C Entered By: [name] on 08/30/2020 14:54:07 -------------------------------------------------------------------------------- Progress Note Details Patient Name: Date of Service: [name]. 08/30/2020 2:45 PM Medical Record Number: [id number] Patient Account Number: [id number] Date of Birth/Sex: Treating RN: 10/11/1950 (69 y.o. [name] Primary Care Provider: Jilda [name] Other Clinician: Referring Provider: Treating Provider/Extender: [name] in Treatment: 8 Subjective Chief Complaint Information obtained from Patient Left leg and foot ulcers History of Present Illness (HPI) 10/11/17; Mr. Sweda is a 70 year old man who tells me that in 2015 he slipped down the latter traumatizing his left leg. He developed a wound in the same spot the area that we are currently looking at. He states this closed over for the most part although he always felt it was somewhat unstable. In 2016 he hit the same area with the door  of his car had this reopened. He tells me that this is never really closed although sometimes an inflow it remains open on a constant basis. He has not been using any specific dressing to this except for topical antibiotics the nature of which were not really sure. His primary doctor did send him to see Dr. Einar [name] of interventional cardiology. He underwent an angiogram on 08/06/17 and he underwent a PTA and directional atherectomy of the lesser distal SFA and popliteal arteries which resulted in brisk improvement in blood flow. It was noted that he had 2 vessel runoff through the anterior tibial and peroneal. He is also  been to see vascular and interventional radiologist. He was not felt to have any significant superficial venous insufficiency. Presumably is not a candidate for any ablation. It was suggested he come here for wound care. The patient is a type II diabetic on insulin. He also has a history of venous insufficiency. ABIs on the left were noncompressible in our clinic 10/21/17; patient we admitted to the clinic last week. 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 We put Iodosorb on him after an aggressive debridement and 3 layer compression. He complained of pain in his ankle and itching with is skin in fact he scratched the area on the medial calf superiorly at the rim of our wraps and he has 2 small open areas in that location today which are new. I changed his primary dressing today to silver collagen. As noted he is already had revascularization and does not have any significant superficial venous insufficiency that would be amenable to ablation 10/28/17; patient admitted to the clinic 2 weeks ago. He has a smaller Wound. Scratch injury from last week revealed. There is large wound over the tibial area. This is smaller. Granulation looks healthy. No need for debridement. 11/04/17; the wound on the left lateral calf looks better. Improved dimensions. Surface of this looks better. We've been maintaining him and Kerlix Coban wraps. He finds this much more comfortable. Silver collagen dressing 11/11/17; left lateral Wound continues to look healthy be making progress. Using a #5 curet I removed removed nonviable skin from the surface of the wound and then necrotic debris from the wound surface. Surface of the wound continues to look healthy. ooHe also has an open area on the left great toenail bed. We've been using topical antibiotics. 11/19/17; left anterior lateral wound continues to look healthy but it's not closed. ooHe also had a small  wound above this on the left leg ooInitially traumatic wounds in the setting of significant chronic venous insufficiency and stasis dermatitis 11/25/17; left anterior wounds superiorly is closed still a small wound inferiorly. 12/02/17; left anterior tibial area. Arrives today with adherent callus. Post debridement clearly not completely closed. Hydrofera Blue under 3 layer compression. 12/09/17; left anterior tibia. Circumferential eschar however the wound bed looks stable to improved. We've been using Hydrofera Blue under 3 layer compression 12/17/17; left anterior tibia. Apparently this was felt to be closed however when the wrap was taken off there is a skin tear to reopen wounds in the same area we've been using Hydrofera Blue under 3 layer compression 12/23/17 left anterior tibia. Not close to close this week apparently the [facility] was stuck to this again. Still circumferential eschar requiring debridement. I put a contact layer on this this time under the Hydrofera Blue 12/31/17; left anterior tibia. Wound is better slight amount of  hyper-granulation. Using Hydrofera Blue over Adaptic. 01/07/18; left anterior tibia. The wound had some surface eschar however after this was removed he has no open wound.he was already revascularized by Dr. Einar [name] when he came to our clinic with atherectomy of the left SFA and popliteal artery. He was also sent to interventional radiology for venous reflux studies. He was not felt to have significant reflux but certainly has chronic venous changes of his skin with hemosiderin deposition around this area. He will definitely need to lubricate his skin and wear compression stocking and I've talked to him about this. READMISSION 05/26/2018 This is a now 70 year old man we cared for with traumatic wounds on his left anterior lower extremity. He had been previously revascularized during that admission by Dr. Einar [name]. Apparently in follow-up Dr. Einar [name] noted that he had  deterioration in his arterial status. He underwent a stent placement in the distal left SFA on 04/22/2018. 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 He went back to the angiography suite on 04/30/2018 he underwent PTA and balloon angioplasty of the occluded left mid anterior tibial artery, thrombotic occlusion went from 100 to 0% which reconstitutes the posterior tibial artery. He had thrombectomy and aspiration of the peroneal artery. The stent placed in the distal SFA left SFA was still occluded. 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 He has been recuperating at [facility] after the most recent hospitalization. He is going home tomorrow. He tells me that 2 weeks ago he traumatized the tip of his left fifth toe. He came in urgently for our review of this. This was a history of before I noted that Dr. Einar [name] had already noted dry gangrenous changes of the left fifth toe 06/09/2018; 2-week follow-up. I did contact Dr. Einar [name] after his last appointment and he apparently saw 1 of Dr. Irven Shelling colleagues the next day. He does not follow-up with Dr. Einar [name] himself until Thursday of this week. 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 Nevertheless there is no evidence of infection no drainage and no pain. He had a new area that this week when we were signing him in today on the left anterior mid tibia area, this is in close proximity to the previous wound we have dealt with in this clinic. 06/23/2018; 2-week follow-up. I did not receive a recent note from Dr. Einar [name] to review today. Our office is trying to obtain this. He is apparently not planning to do further vascular interventions and wondered about  compression to try and help with the patient's chronic venous insufficiency. However we are also concerned about the arterial flow. ooHe arrives in clinic today with a new area on the left third toe. The areas on the calf/anterior tibia are close to closing. The left fifth toe is still mummified using Betadine. -In reviewing things with the patient he has what sounds like claudication with mild to moderate amount of activity. 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 I prescribed Levaquin over the phone while we attempted to arrange a plan of care. However the patient called yesterday to report he had low-grade fever and he came in today acutely. There is been a  marked deterioration in the left third toe with spreading cellulitis up into the dorsal left foot. He was referred to the emergency room. Readmission: 06/29/2020 patient presents today for reevaluation here in our clinic he was previously treated by Dr. Dellia Nims at the latter part of 2019 in 2 the beginning of 2020. Subsequently we have not seen him since that time in the interim he did have evaluation with vein and vascular specialist specifically Dr. Anice Paganini who did perform quite extensive work for a left femoral to anterior tibial artery bypass. With that being said in the interim the patient has developed significant lymphedema and has wounds that he tells me have really never healed in regard to the incision site on the left leg. He also has multiple wounds on the feet for various reasons some of which is that he tends to pick at his feet. Fortunately there is no signs of active infection systemically at this time he does have some wounds that are little bit deeper but most are fairly superficial he seems to have good blood flow and overall everything appears to be healthy I see no bone exposed and no obvious signs of osteomyelitis. I do not know that he necessarily needs a x-ray at this point although  that something we could consider depending on how things progress. The patient does have a history of lymphedema, diabetes, this is type II, chronic kidney disease stage III, hypertension, and history of peripheral vascular disease. 07/05/2020; patient admitted last week. Is a patient I remember from 2019 he had a spreading infection involving the left foot and we sent him to the hospital. 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 He subsequently had a left femoral to anterior tibial bypass by Dr.[facility] vein and vascular. He also has severe lymphedema with chronic skin changes related to that on the left leg. The most problematic area that was new today was on the left medial great toe. This was apparently a small area last week there was purulent drainage which our intake nurse cultured. Also areas on the left medial foot and heel left lateral foot. He has 2 areas on the left medial calf left lateral calf in the setting of the severe lymphedema. 07/13/2020 on evaluation today patient appears to be doing better in my opinion compared to his last visit. The good news is there is no signs of active infection systemically and locally I do not see any signs of infection either. He did have an x-ray which was negative that is great news he had a culture which showed MRSA but at the same time he is been on the doxycycline which has helped. I do think we may want to extend this for 7 additional days 1/25; patient admitted to the clinic a few weeks ago. 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 We have been putting him under compression his edema control is a lot better but he is severe verricused skin on the left leg. He is really done quite well he still has an open area on the left medial calf and the left medial first metatarsal head. We have been using silver collagen on the leg silver alginate on the foot 07/27/2020 upon  evaluation today patient appears to be doing decently well in regard to his wounds. He still has a lot of dry skin on the left leg. Some of this is starting to peel back  and I think he may be able to have them out by removing some that today. Fortunately there is no signs of active infection at this time on the left leg although on the right leg he does appear to have swelling and erythema as well as some mild warmth to touch. This does have been concerned about the possibility of cellulitis although within the differential diagnosis I do think that potentially a DVT has to be at least considered. We need to rule that out before proceeding would just call in the cellulitis. Especially since he is having pain in the posterior aspect of his calf muscle. 2/8; the patient had seen sparingly. He has severe skin changes of chronic lymphedema in the left leg thickened hyperkeratotic verrucous skin. He has an open wound on the medial part of the left first met head left mid tibia. He also has a rim of nonepithelialized skin in the anterior mid tibia. He brought in the AmLactin lotion that was been prescribed although I am not sure under compression and its utility. There concern about cellulitis on the right lower leg the last time he was here. He was put on on antibiotics. His DVT rule out was negative. The right leg looks fine he is using his stocking on this area 08/10/2020 upon evaluation today patient appears to be doing well with regard to his leg currently. He has been tolerating the dressing changes without complication. Fortunately there is no signs of active infection which is great news. Overall very pleased with where things stand. 2/22; the patient still has an area on the medial part of the left first met his head. This looks better than when I last saw this earlier this month he has a rim of epithelialization but still some surface debris. Mostly everything on the left leg is healed. There is  still a vulnerable in the left mid tibia area. 08/30/2020 upon evaluation today patient appears to be doing much better in regard to his wounds on his foot. 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 Nonetheless I think we will address that today I Minna send in a prescription for him in that regard. Overall though there does not appear to be any signs of significant worsening. Objective Constitutional Well-nourished and well-hydrated in no acute distress. Vitals Time Taken: 3:17 PM, Height: 73 in, Source: Stated, Weight: 245 lbs, Source: Stated, BMI: 32.3, Temperature: 98.5 F, Pulse: 83 bpm, Respiratory Rate: 18 breaths/min, Blood Pressure: 132/80 mmHg. Respiratory normal breathing without difficulty. Psychiatric this patient is able to make decisions and demonstrates good insight into disease process. Alert and Oriented x 3. pleasant and cooperative. General Notes: Patient's wound did require sharp debridement and in the process of debriding I did uncover as well a fluid collection of pus underneath the medial portion of his great toe. The patient did have this area debrided away and post debridement most of this was new skin underneath although there was a very small opening nonetheless I think this will do quite well. Integumentary (Hair, Skin) Wound #13 status is Open. Original cause of wound was Trauma. The date acquired was: 05/25/2020. The wound has been in treatment 8 weeks. The wound is located on the Left,Distal,Medial Foot. The wound measures 1cm length x 2cm width x 0.2cm depth; 1.571cm^2 area and 0.314cm^3 volume. There is Fat Layer (Subcutaneous Tissue) exposed. There  is no tunneling or undermining noted. There is a medium amount of purulent drainage noted. The wound margin is distinct with the outline attached to the wound base. There is medium  (34-66%) pink granulation within the wound bed. There is a medium (34-66%) amount of necrotic tissue within the wound bed including Adherent Slough. General Notes: Callused Periwound Assessment Active Problems ICD-10 Type 2 diabetes mellitus with foot ulcer Lymphedema, not elsewhere classified Non-pressure chronic ulcer of other part of left foot with fat layer exposed Other specified peripheral vascular diseases Essential (primary) hypertension Chronic kidney disease, stage 3 unspecified Corns and callosities Procedures Wound #13 Pre-procedure diagnosis of Wound #13 is a Diabetic Wound/Ulcer of the Lower Extremity located on the Left,Distal,Medial Foot .Severity of Tissue Pre Debridement is: Fat layer exposed. There was a Excisional Skin/Subcutaneous Tissue Debridement with a total area of 2 sq cm performed by [name] Keeler, PA. With the following instrument(s): Curette to remove Viable and Non-Viable tissue/material. Material removed includes Callus, Subcutaneous Tissue, Skin: Dermis, and Skin: Epidermis after achieving pain control using Lidocaine. No specimens were taken. A time out was conducted at 16:04, prior to the start of the procedure. A Minimum amount of bleeding was controlled with Pressure. The procedure was tolerated well with a pain level of 0 throughout and a pain level of 0 following the procedure. Post Debridement Measurements: 1cm length x 2cm width x 0.2cm depth; 0.314cm^3 volume. Character of Wound/Ulcer Post Debridement is improved. Severity of Tissue Post Debridement is: Fat layer exposed. Post procedure Diagnosis Wound #13: Same as Pre-Procedure Pre-procedure diagnosis of Wound #13 is a Diabetic Wound/Ulcer of the Lower Extremity located on the Left,Distal,Medial Foot . There was a Four Layer Compression Therapy Procedure by [name] Hammock, RN. Post procedure Diagnosis Wound #13: Same as Pre-Procedure Plan Follow-up Appointments: Return Appointment in 1  week. Bathing/ Shower/ Hygiene: May shower with protection but do not get wound dressing(s) wet. - use cast protector to cover dressing in the shower Edema Control - Lymphedema / SCD / Other: Elevate legs to the level of the heart or above for 30 minutes daily and/or when sitting, a frequency of: - throughout the day Avoid standing for long periods of time. Exercise regularly Moisturize legs daily. - right leg Compression stocking or Garment 20-30 mm/Hg pressure to: - right leg daily Off-Loading: Other: - healing sandal left foot. Non Wound Condition: Other Non Wound Condition Orders/Instructions: - apply calcium alginate with silver to small dry skin areas on left ant. leg. The following medication(s) was prescribed: Bactrim DS oral 800 mg-160 mg tablet 1 1 tablet oral taken 2 times per day for 10 days starting 08/31/2020 WOUND #13: - Foot Wound Laterality: Left, Medial, Distal Cleanser: Soap and Water 1 x Per Week/ Discharge Instructions: May shower and wash wound with dial antibacterial soap and water prior to dressing change. Peri-Wound Care: ammonium lactate 1 x Per Week/ Discharge Instructions: Apply to thickened skin on leg Prim Dressing: Hydrofera Blue Classic Foam, 2x2 in 1 x Per Week/ ary Discharge Instructions: Moisten with saline prior to applying to wound bed Secondary Dressing: Woven Gauze Sponge, Non-Sterile 4x4 in 1 x Per Week/ Discharge Instructions: Apply over primary dressing as directed. Com pression Wrap: FourPress (4 layer compression wrap) 1 x Per Week/ Discharge Instructions: Apply four layer compression as directed. 1. Would recommend currently that we continue with the wound care measures as before and this includes the use of the [facility] dressing which I think is doing a great  job. 2. I am also can recommend that we continue with his ammonium lactate lotion. 3. I am also can recommend that we have the patient continue with a 4-layer compression wrap which  I think is also helpful for him. We will see patient back for reevaluation in 1 week here in the clinic. If anything worsens or changes patient will contact our office for additional recommendations. Electronic Signature(s) Signed: 08/31/2020 6:06:11 PM By: [name] Keeler PA-C Previous Signature: 08/31/2020 8:20:55 AM Version By: [name] Keeler PA-C Entered By: [name] on 08/31/2020 18:06:10 -------------------------------------------------------------------------------- SuperBill Details Patient Name: Date of Service: [name] 08/30/2020 Medical Record Number: [id number] Patient Account Number: [id number] Date of Birth/Sex: Treating RN: 1950/10/06 (69 y.o. [name], [name] Primary Care Provider: Jilda [name] Other Clinician: Referring Provider: Treating Provider/Extender: [name] in Treatment: 8 Diagnosis Coding ICD-10 Codes Code Description [phone] Type 2 diabetes mellitus with foot ulcer I89.0 Lymphedema, not elsewhere classified L97.522 Non-pressure chronic ulcer of other part of left foot with fat layer exposed I73.89 Other specified peripheral vascular diseases I10 Essential (primary) hypertension N18.30 Chronic kidney disease, stage 3 unspecified L84 Corns and callosities Facility Procedures CPT4 Code: [id number] Description: 59968 - DEB SUBQ TISSUE 20 SQ CM/< ICD-10 Diagnosis Description L97.522 Non-pressure chronic ulcer of other part of left foot with fat layer exposed Modifier: Quantity: 1 Physician Procedures : CPT4 Code Description Modifier [id number] - WC PHYS SUBQ TISS 20 SQ CM ICD-10 Diagnosis Description L97.522 Non-pressure chronic ulcer of other part of left foot with fat layer exposed Quantity: 1 Electronic Signature(s) Signed: 08/31/2020 8:21:02 AM By: [name] Keeler PA-C Entered By: [name] on 08/31/2020 08:21:02

## 2020-08-31 NOTE — Progress Notes (Addendum)
EPHRAM, KORNEGAY ([id number]) Visit Report for 08/30/2020 Arrival Information Details Patient Name: Date of Service: [name], [name] 08/30/2020 2:45 PM Medical Record Number: [id number] Patient Account Number: [id number] Date of Birth/Sex: Treating RN: 04-Jan-1951 (70 y.o. [name] Primary Care Provider: [name] Other Clinician: Referring Provider: Treating Provider/Extender: Myles Gip in Treatment: 8 Visit Information History Since Last Visit Added or deleted any medications: No Patient Arrived: Ambulatory Any new allergies or adverse reactions: No Arrival Time: 15:16 Had a fall or experienced change in No Accompanied By: self activities of daily living that may affect Transfer Assistance: None risk of falls: Patient Identification Verified: Yes Signs or symptoms of abuse/neglect since last visito No Secondary Verification Process Completed: Yes Hospitalized since last visit: No Patient Requires Transmission-Based No Implantable device outside of the clinic excluding No Precautions: cellular tissue based products placed in the center Patient Has Alerts: Yes since last visit: Patient Alerts: Patient on Blood Thinner Has Dressing in Place as Prescribed: Yes 01/29/20 LABI 1.17 TBI 0.88 Has Compression in Place as Prescribed: Yes 01/29/20 R 1.23 Pain Present Now: No Electronic Signature(s) Signed: 08/30/2020 6:01:57 PM By: Baruch Gouty RN, BSN Entered By: Baruch Gouty on 08/30/2020 15:16:24 -------------------------------------------------------------------------------- Compression Therapy Details Patient Name: Date of Service: [name]. 08/30/2020 2:45 PM Medical Record Number: [id number] Patient Account Number: [id number] Date of Birth/Sex: Treating RN: 1950/09/19 (69 y.o. [name], [name] Primary Care Provider: [name] Other Clinician: Referring Provider: Treating Provider/Extender: Myles Gip in  Treatment: 8 Compression Therapy Performed for Wound Assessment: Wound #13 Left,Distal,Medial Foot Performed By: Clinician Rhae Hammock, RN Compression Type: Four Layer Post Procedure Diagnosis Same as Pre-procedure Electronic Signature(s) Signed: 08/31/2020 5:36:09 PM By: Rhae Hammock RN Entered By: Rhae Hammock on 08/30/2020 16:11:48 -------------------------------------------------------------------------------- Encounter Discharge Information Details Patient Name: Date of Service: [name]. 08/30/2020 2:45 PM Medical Record Number: [id number] Patient Account Number: [id number] Date of Birth/Sex: Treating RN: 1950-12-15 (70 y.o. [name] Primary Care Provider: [name] Other Clinician: Referring Provider: Treating Provider/Extender: Myles Gip in Treatment: 8 Encounter Discharge Information Items Post Procedure Vitals Discharge Condition: Stable Temperature (F): 98.5 Ambulatory Status: Ambulatory Pulse (bpm): 83 Discharge Destination: Home Respiratory Rate (breaths/min): 18 Transportation: Private Auto Blood Pressure (mmHg): 132/80 Accompanied By: self Schedule Follow-up Appointment: Yes Clinical Summary of Care: Electronic Signature(s) Signed: 08/30/2020 5:55:08 PM By: Deon Pilling Entered By: Deon Pilling on 08/30/2020 16:42:57 -------------------------------------------------------------------------------- Lower Extremity Assessment Details Patient Name: Date of Service: [name] 08/30/2020 2:45 PM Medical Record Number: [id number] Patient Account Number: [id number] Date of Birth/Sex: Treating RN: 1951/06/21 (70 y.o. [name], [name] Primary Care Provider: [name] Other Clinician: Referring Provider: Treating Provider/Extender: Myles Gip in Treatment: 8 Edema Assessment Assessed: [Left: No] [name]: No] Edema: [Left: N] [Right: o] Calf Left: Right: Point of  Measurement: 39 cm From Medial Instep 36.5 cm Ankle Left: Right: Point of Measurement: 12 cm From Medial Instep 24 cm Vascular Assessment Pulses: Dorsalis Pedis Palpable: [Left:Yes] Electronic Signature(s) Signed: 08/30/2020 5:00:20 PM By: Lorrin Jackson Signed: 08/31/2020 5:36:09 PM By: Rhae Hammock RN Entered By: Lorrin Jackson on 08/30/2020 15:40:24 -------------------------------------------------------------------------------- Multi-Disciplinary Care Plan Details Patient Name: Date of Service: [name]. 08/30/2020 2:45 PM Medical Record Number: [id number] Patient Account Number: [id number] Date of Birth/Sex: Treating RN: 09-06-1950 (70 y.o. [name] Primary Care Provider: [name] Other Clinician: Referring Provider: Treating Provider/Extender:  Stone III, Lennox Grumbles, Carloyn Manner Weeks in Treatment: Oasis reviewed with physician Active Inactive Nutrition Nursing Diagnoses: Impaired glucose control: actual or potential Potential for alteratiion in Nutrition/Potential for imbalanced nutrition Goals: Patient/caregiver agrees to and verbalizes understanding of need to use nutritional supplements and/or vitamins as prescribed Date Initiated: 06/29/2020 Date Inactivated: 07/27/2020 Target Resolution Date: 07/29/2020 Goal Status: Met Patient/caregiver will maintain therapeutic glucose control Date Initiated: 06/29/2020 Target Resolution Date: 09/24/2020 Goal Status: Active Interventions: Assess HgA1c results as ordered upon admission and as needed Assess patient nutrition upon admission and as needed per policy Provide education on elevated blood sugars and impact on wound healing Provide education on nutrition Treatment Activities: Education provided on Nutrition : 08/16/2020 Notes: Venous Leg Ulcer Nursing Diagnoses: Actual venous Insuffiency (use after diagnosis is confirmed) Knowledge deficit related to disease process and  management Goals: Patient will maintain optimal edema control Date Initiated: 06/29/2020 Target Resolution Date: 09/24/2020 Goal Status: Active Patient/caregiver will verbalize understanding of disease process and disease management Date Initiated: 06/29/2020 Date Inactivated: 07/27/2020 Target Resolution Date: 07/29/2020 Goal Status: Met Interventions: Assess peripheral edema status every visit. Compression as ordered Provide education on venous insufficiency Notes: Wound/Skin Impairment Nursing Diagnoses: Impaired tissue integrity Knowledge deficit related to ulceration/compromised skin integrity Goals: Patient/caregiver will verbalize understanding of skin care regimen Date Initiated: 06/29/2020 Target Resolution Date: 09/24/2020 Goal Status: Active Interventions: Assess patient/caregiver ability to obtain necessary supplies Assess patient/caregiver ability to perform ulcer/skin care regimen upon admission and as needed Assess ulceration(s) every visit Provide education on ulcer and skin care Notes: Electronic Signature(s) Signed: 08/31/2020 5:36:09 PM By: Rhae Hammock RN Entered By: Rhae Hammock on 08/30/2020 16:10:55 -------------------------------------------------------------------------------- Pain Assessment Details Patient Name: Date of Service: [name]. 08/30/2020 2:45 PM Medical Record Number: [id number] Patient Account Number: [id number] Date of Birth/Sex: Treating RN: 27-Oct-1950 (70 y.o. [name], [name] Primary Care Deanie Jupiter: [name] Other Clinician: Referring Eriana Suliman: Treating Tyerra Loretto/Extender: Myles Gip in Treatment: 8 Active Problems Location of Pain Severity and Description of Pain Patient Has Paino No Site Locations Pain Management and Medication Current Pain Management: Electronic Signature(s) Signed: 08/31/2020 4:22:28 PM By: Sandre Kitty Signed: 08/31/2020 5:36:09 PM By: Rhae Hammock RN Entered By:  Sandre Kitty on 08/30/2020 15:30:13 -------------------------------------------------------------------------------- Patient/Caregiver Education Details Patient Name: Date of Service: [name] 3/8/2022andnbsp2:45 PM Medical Record Number: [id number] Patient Account Number: [id number] Date of Birth/Gender: Treating RN: January 07, 1951 (70 y.o. [name] Primary Care Physician: [name] Other Clinician: Referring Physician: Treating Physician/Extender: Myles Gip in Treatment: 8 Education Assessment Education Provided To: Patient Education Topics Provided Wound/Skin Impairment: Methods: Explain/Verbal Responses: State content correctly Electronic Signature(s) Signed: 08/31/2020 5:36:09 PM By: Rhae Hammock RN Entered By: Rhae Hammock on 08/30/2020 16:11:21 -------------------------------------------------------------------------------- Wound Assessment Details Patient Name: Date of Service: [name]. 08/30/2020 2:45 PM Medical Record Number: [id number] Patient Account Number: [id number] Date of Birth/Sex: Treating RN: July 01, 1950 (69 y.o. [name], [name] Primary Care Devereaux Grayson: [name] Other Clinician: Referring Becka Lagasse: Treating Meira Wahba/Extender: Myles Gip in Treatment: 8 Wound Status Wound Number: 13 Primary Diabetic Wound/Ulcer of the Lower Extremity Etiology: Wound Location: Left, Distal, Medial Foot Wound Open Wounding Event: Trauma Status: Date Acquired: 05/25/2020 Comorbid Glaucoma, Sleep Apnea, Hypertension, Peripheral Arterial Disease, Weeks Of Treatment: 8 History: Peripheral Venous Disease, Type II Diabetes, Gout, Osteoarthritis, Clustered Wound: No Neuropathy Photos Wound Measurements Length: (cm) 1 Width: (cm) 2 Depth: (cm) 0.2 Area: (cm) 1.571  Volume: (cm) 0.314 % Reduction in Area: -565.7% % Reduction in Volume: -1208.3% Epithelialization: Medium  (34-66%) Tunneling: No Undermining: No Wound Description Classification: Grade 2 Wound Margin: Distinct, outline attached Exudate Amount: Medium Exudate Type: Purulent Exudate Color: yellow, brown, green Wound Bed Granulation Amount: Medium (34-66%) Granulation Quality: Pink Necrotic Amount: Medium (34-66%) Necrotic Quality: Adherent Slough Foul Odor After Cleansing: No Slough/Fibrino Yes Exposed Structure Fascia Exposed: No Fat Layer (Subcutaneous Tissue) Exposed: Yes Tendon Exposed: No Muscle Exposed: No Joint Exposed: No Bone Exposed: No Assessment Notes Callused Periwound Treatment Notes Wound #13 (Foot) Wound Laterality: Left, Medial, Distal Cleanser Soap and Water Discharge Instruction: May shower and wash wound with dial antibacterial soap and water prior to dressing change. Peri-Wound Care ammonium lactate Discharge Instruction: Apply to thickened skin on leg Topical Primary Dressing Hydrofera Blue Classic Foam, 2x2 in Discharge Instruction: Moisten with saline prior to applying to wound bed Secondary Dressing Woven Gauze Sponge, Non-Sterile 4x4 in Discharge Instruction: Apply over primary dressing as directed. Secured With Compression Wrap FourPress (4 layer compression wrap) Discharge Instruction: Apply four layer compression as directed. Compression Stockings Add-Ons Electronic Signature(s) Signed: 09/01/2020 5:24:28 PM By: Rhae Hammock RN Signed: 09/02/2020 10:32:25 AM By: Sandre Kitty Previous Signature: 08/30/2020 5:00:20 PM Version By: Lorrin Jackson Previous Signature: 08/31/2020 5:36:09 PM Version By: Rhae Hammock RN Entered By: Sandre Kitty on 09/01/2020 11:16:47 -------------------------------------------------------------------------------- Vitals Details Patient Name: Date of Service: [name]. 08/30/2020 2:45 PM Medical Record Number: [id number] Patient Account Number: [id number] Date of Birth/Sex: Treating  RN: [birth year] (70 y.o. [name] Primary Care Emrys Mceachron: [name] Other Clinician: Referring Dyron Kawano: Treating Nijae Doyel/Extender: Myles Gip in Treatment: 8 Vital Signs Time Taken: 15:17 Temperature (F): 98.5 Height (in): 73 Pulse (bpm): 83 Source: Stated Respiratory Rate (breaths/min): 18 Weight (lbs): 245 Blood Pressure (mmHg): 132/80 Source: Stated Reference Range: 80 - 120 mg / dl Body Mass Index (BMI): 32.3 Electronic Signature(s) Signed: 08/30/2020 6:01:57 PM By: Baruch Gouty RN, BSN Entered By: Baruch Gouty on 08/30/2020 15:19:01

## 2020-09-07 ENCOUNTER — Encounter (HOSPITAL_BASED_OUTPATIENT_CLINIC_OR_DEPARTMENT_OTHER): Payer: BC Managed Care – PPO | Admitting: Physician Assistant

## 2020-09-07 ENCOUNTER — Other Ambulatory Visit: Payer: Self-pay

## 2020-09-07 DIAGNOSIS — E11621 Type 2 diabetes mellitus with foot ulcer: Secondary | ICD-10-CM | POA: Diagnosis not present

## 2020-09-07 NOTE — Progress Notes (Addendum)
[name] ([id number]) Visit Report for 09/07/2020 Chief Complaint Document Details Patient Name: Date of Service: [name], [name] 09/07/2020 9:15 A M Medical Record Number: [id number] Patient Account Number: [id number] Date of Birth/Sex: Treating RN: [birth year] (70 y.o. [name] Primary Care Provider: Jilda [name] Other Clinician: Referring Provider: Treating Provider/Extender: Myles Gip in Treatment: 10 Information Obtained from: Patient Chief Complaint Left leg and foot ulcers Electronic Signature(s) Signed: 09/07/2020 9:32:42 AM By: Worthy Keeler PA-C Entered By: Worthy Keeler on 09/07/2020 09:32:42 -------------------------------------------------------------------------------- Debridement Details Patient Name: Date of Service: [name]. 09/07/2020 9:15 A M Medical Record Number: [id number] Patient Account Number: [id number] Date of Birth/Sex: Treating RN: 1951/04/20 (70 y.o. [name] Primary Care Provider: Jilda [name] Other Clinician: Referring Provider: Treating Provider/Extender: Myles Gip in Treatment: 10 Debridement Performed for Assessment: Wound #17 Left Metatarsal head first Performed By: Physician Worthy Keeler, PA Debridement Type: Debridement Severity of Tissue Pre Debridement: Fat layer exposed Level of Consciousness (Pre-procedure): Awake and Alert Pre-procedure Verification/Time Out Yes - 09:55 Taken: Start Time: 09:57 Pain Control: Lidocaine 5% topical ointment T Area Debrided (L x W): otal 1 (cm) x 1 (cm) = 1 (cm) Tissue and other material debrided: Viable, Non-Viable, Callus, Skin: Epidermis Level: Skin/Epidermis Debridement Description: Selective/Open Wound Instrument: Curette Bleeding: Minimum Hemostasis Achieved: Pressure End Time: 10:02 Procedural Pain: 0 Post Procedural Pain: 0 Response to Treatment: Procedure was tolerated well Level of Consciousness  (Post- Awake and Alert procedure): Post Debridement Measurements of Total Wound Length: (cm) 0.5 Width: (cm) 0.4 Depth: (cm) 0.1 Volume: (cm) 0.016 Character of Wound/Ulcer Post Debridement: Improved Severity of Tissue Post Debridement: Fat layer exposed Post Procedure Diagnosis Same as Pre-procedure Electronic Signature(s) Signed: 09/07/2020 6:22:43 PM By: Worthy Keeler PA-C Signed: 09/08/2020 5:44:19 PM By: Baruch Gouty RN, BSN Entered By: Baruch Gouty on 09/07/2020 10:03:44 -------------------------------------------------------------------------------- Debridement Details Patient Name: Date of Service: [name]. 09/07/2020 9:15 A M Medical Record Number: [id number] Patient Account Number: [id number] Date of Birth/Sex: Treating RN: Jun 14, 1951 (70 y.o. [name] Primary Care Provider: Jilda [name] Other Clinician: Referring Provider: Treating Provider/Extender: Myles Gip in Treatment: 10 Debridement Performed for Assessment: Wound #13 Left,Distal,Medial Foot Performed By: Physician Worthy Keeler, PA Debridement Type: Debridement Severity of Tissue Pre Debridement: Fat layer exposed Level of Consciousness (Pre-procedure): Awake and Alert Pre-procedure Verification/Time Out Yes - 09:55 Taken: Start Time: 09:57 Pain Control: Lidocaine 5% topical ointment T Area Debrided (L x W): otal 2 (cm) x 1.8 (cm) = 3.6 (cm) Tissue and other material debrided: Viable, Non-Viable, Callus, Slough, Subcutaneous, Skin: Epidermis, Slough Level: Skin/Subcutaneous Tissue Debridement Description: Excisional Instrument: Curette Bleeding: Minimum Hemostasis Achieved: Pressure End Time: 10:02 Procedural Pain: 0 Post Procedural Pain: 0 Response to Treatment: Procedure was tolerated well Level of Consciousness (Post- Awake and Alert procedure): Post Debridement Measurements of Total Wound Length: (cm) 0.6 Width: (cm) 0.3 Depth: (cm)  0.1 Volume: (cm) 0.014 Character of Wound/Ulcer Post Debridement: Improved Severity of Tissue Post Debridement: Fat layer exposed Post Procedure Diagnosis Same as Pre-procedure Electronic Signature(s) Signed: 09/07/2020 6:22:43 PM By: Worthy Keeler PA-C Signed: 09/08/2020 5:44:19 PM By: Baruch Gouty RN, BSN Entered By: Baruch Gouty on 09/07/2020 10:04:00 -------------------------------------------------------------------------------- HPI Details Patient Name: Date of Service: [name]. 09/07/2020 9:15 A M Medical Record Number: [id number] Patient Account Number: [id number] Date of Birth/Sex: Treating RN: 12-Jul-1950 (70 y.o. [name]  Primary Care Provider: Jilda [name] Other Clinician: Referring Provider: Treating Provider/Extender: Myles Gip in Treatment: 10 History of Present Illness HPI Description: 10/11/17; Mr. Conran is a 70 year old man who tells me that in 2015 he slipped down the latter traumatizing his left leg. He developed a wound in the same spot the area that we are currently looking at. He states this closed over for the most part although he always felt it was somewhat unstable. In 2016 he hit the same area with the door of his car had this reopened. He tells me that this is never really closed although sometimes an inflow it remains open on a constant basis. He has not been using any specific dressing to this except for topical antibiotics the nature of which were not really sure. His primary doctor did send him to see Dr. [name] of interventional cardiology. He underwent an angiogram on 08/06/17 and he underwent a PTA and directional atherectomy of the lesser distal SFA and popliteal arteries which resulted in brisk improvement in blood flow. It was noted that he had 2 vessel runoff through the anterior tibial and peroneal. He is also been to see vascular and interventional radiologist. He was not felt to have any significant  superficial venous insufficiency. Presumably is not a candidate for any ablation. It was suggested he come here for wound care. The patient is a type II diabetic on insulin. He also has a history of venous insufficiency. ABIs on the left were noncompressible in our clinic 10/21/17; patient we admitted to the clinic last week. 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 We put Iodosorb on him after an aggressive debridement and 3 layer compression. He complained of pain in his ankle and itching with is skin in fact he scratched the area on the medial calf superiorly at the rim of our wraps and he has 2 small open areas in that location today which are new. I changed his primary dressing today to silver collagen. As noted he is already had revascularization and does not have any significant superficial venous insufficiency that would be amenable to ablation 10/28/17; patient admitted to the clinic 2 weeks ago. He has a smaller Wound. Scratch injury from last week revealed. There is large wound over the tibial area. This is smaller. Granulation looks healthy. No need for debridement. 11/04/17; the wound on the left lateral calf looks better. Improved dimensions. Surface of this looks better. We've been maintaining him and Kerlix Coban wraps. He finds this much more comfortable. Silver collagen dressing 11/11/17; left lateral Wound continues to look healthy be making progress. Using a #5 curet I removed removed nonviable skin from the surface of the wound and then necrotic debris from the wound surface. Surface of the wound continues to look healthy. He also has an open area on the left great toenail bed. We've been using topical antibiotics. 11/19/17; left anterior lateral wound continues to look healthy but it's not closed. He also had a small wound above this on the left leg Initially traumatic wounds in the setting of significant chronic  venous insufficiency and stasis dermatitis 11/25/17; left anterior wounds superiorly is closed still a small wound inferiorly. 12/02/17; left anterior tibial area. Arrives today with adherent callus. Post debridement clearly not completely closed. Hydrofera Blue under 3 layer compression. 12/09/17; left anterior tibia. Circumferential eschar however the wound bed looks stable to improved. We've been using Hydrofera  Blue under 3 layer compression 12/17/17; left anterior tibia. Apparently this was felt to be closed however when the wrap was taken off there is a skin tear to reopen wounds in the same area we've been using Hydrofera Blue under 3 layer compression 12/23/17 left anterior tibia. Not close to close this week apparently the [facility] was stuck to this again. Still circumferential eschar requiring debridement. I put a contact layer on this this time under the Hydrofera Blue 12/31/17; left anterior tibia. Wound is better slight amount of hyper-granulation. Using Hydrofera Blue over Adaptic. 01/07/18; left anterior tibia. The wound had some surface eschar however after this was removed he has no open wound.he was already revascularized by Dr. [name] when he came to our clinic with atherectomy of the left SFA and popliteal artery. He was also sent to interventional radiology for venous reflux studies. He was not felt to have significant reflux but certainly has chronic venous changes of his skin with hemosiderin deposition around this area. He will definitely need to lubricate his skin and wear compression stocking and I've talked to him about this. READMISSION 05/26/2018 This is a now 70 year old man we cared for with traumatic wounds on his left anterior lower extremity. He had been previously revascularized during that admission by Dr. [name]. Apparently in follow-up Dr. [name] noted that he had deterioration in his arterial status. He underwent a stent placement in the distal left SFA on 04/22/2018.  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 He went back to the angiography suite on 04/30/2018 he underwent PTA and balloon angioplasty of the occluded left mid anterior tibial artery, thrombotic occlusion went from 100 to 0% which reconstitutes the posterior tibial artery. He had thrombectomy and aspiration of the peroneal artery. The stent placed in the distal SFA left SFA was still occluded. 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 He has been recuperating at [facility] in [facility] after the most recent hospitalization. He is going home tomorrow. He tells me that 2 weeks ago he traumatized the tip of his left fifth toe. He came in urgently for our review of this. This was a history of before I noted that Dr. [name] had already noted dry gangrenous changes of the left fifth toe 06/09/2018; 2-week follow-up. I did contact Dr. [name] after his last appointment and he apparently saw 1 of Dr. Irven Shelling colleagues the next day. He does not follow-up with Dr. [name] himself until Thursday of this week. 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 Nevertheless there is no evidence of infection no drainage and no pain. He had a new area that this week when we were signing him in today on the left anterior mid tibia area, this is in close proximity to the previous wound we have dealt with in this clinic. 06/23/2018; 2-week follow-up. I did not receive a recent note from Dr. [name] to review today. Our office is trying to obtain this. He is apparently not planning to do further vascular interventions and wondered about compression to try and help with the patient's chronic venous insufficiency. However we are also concerned  about the arterial flow. He arrives in clinic today with a new area  on the left third toe. The areas on the calf/anterior tibia are close to closing. The left fifth toe is still mummified using Betadine. -In reviewing things with the patient he has what sounds like claudication with mild to moderate amount of activity. 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 I prescribed Levaquin over the phone while we attempted to arrange a plan of care. However the patient called yesterday to report he had low-grade fever and he came in today acutely. There is been a marked deterioration in the left third toe with spreading cellulitis up into the dorsal left foot. He was referred to the emergency room. Readmission: 06/29/2020 patient presents today for reevaluation here in our clinic he was previously treated by Dr. Dellia Nims at the latter part of 2019 in 2 the beginning of 2020. Subsequently we have not seen him since that time in the interim he did have evaluation with vein and vascular specialist specifically Dr. Anice Paganini who did perform quite extensive work for a left femoral to anterior tibial artery bypass. With that being said in the interim the patient has developed significant lymphedema and has wounds that he tells me have really never healed in regard to the incision site on the left leg. He also has multiple wounds on the feet for various reasons some of which is that he tends to pick at his feet. Fortunately there is no signs of active infection systemically at this time he does have some wounds that are little bit deeper but most are fairly superficial he seems to have good blood flow and overall everything appears to be healthy I see no bone exposed and no obvious signs of osteomyelitis. I do not know that he necessarily needs a x-ray at this point although that something we could consider depending on how things progress. The patient does have a history of  lymphedema, diabetes, this is type II, chronic kidney disease stage III, hypertension, and history of peripheral vascular disease. 07/05/2020; patient admitted last week. Is a patient I remember from 2019 he had a spreading infection involving the left foot and we sent him to the hospital. 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 He subsequently had a left femoral to anterior tibial bypass by Dr.Cain vein and vascular. He also has severe lymphedema with chronic skin changes related to that on the left leg. The most problematic area that was new today was on the left medial great toe. This was apparently a small area last week there was purulent drainage which our intake nurse cultured. Also areas on the left medial foot and heel left lateral foot. He has 2 areas on the left medial calf left lateral calf in the setting of the severe lymphedema. 07/13/2020 on evaluation today patient appears to be doing better in my opinion compared to his last visit. The good news is there is no signs of active infection systemically and locally I do not see any signs of infection either. He did have an x-ray which was negative that is great news he had a culture which showed MRSA but at the same time he is been on the doxycycline which has helped. I do think we may want to extend this for 7 additional days 1/25; patient admitted to the clinic a few weeks ago. 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 We have been putting him under compression his  edema control is a lot better but he is severe verricused skin on the left leg. He is really done quite well he still has an open area on the left medial calf and the left medial first metatarsal head. We have been using silver collagen on the leg silver alginate on the foot 07/27/2020 upon evaluation today patient appears to be doing decently well in regard to his wounds. He still has a lot of  dry skin on the left leg. Some of this is starting to peel back and I think he may be able to have them out by removing some that today. Fortunately there is no signs of active infection at this time on the left leg although on the right leg he does appear to have swelling and erythema as well as some mild warmth to touch. This does have been concerned about the possibility of cellulitis although within the differential diagnosis I do think that potentially a DVT has to be at least considered. We need to rule that out before proceeding would just call in the cellulitis. Especially since he is having pain in the posterior aspect of his calf muscle. 2/8; the patient had seen sparingly. He has severe skin changes of chronic lymphedema in the left leg thickened hyperkeratotic verrucous skin. He has an open wound on the medial part of the left first met head left mid tibia. He also has a rim of nonepithelialized skin in the anterior mid tibia. He brought in the AmLactin lotion that was been prescribed although I am not sure under compression and its utility. There concern about cellulitis on the right lower leg the last time he was here. He was put on on antibiotics. His DVT rule out was negative. The right leg looks fine he is using his stocking on this area 08/10/2020 upon evaluation today patient appears to be doing well with regard to his leg currently. He has been tolerating the dressing changes without complication. Fortunately there is no signs of active infection which is great news. Overall very pleased with where things stand. 2/22; the patient still has an area on the medial part of the left first met his head. This looks better than when I last saw this earlier this month he has a rim of epithelialization but still some surface debris. Mostly everything on the left leg is healed. There is still a vulnerable in the left mid tibia area. 08/30/2020 upon evaluation today patient appears to be doing  much better in regard to his wounds on his foot. 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 Nonetheless I think we will address that today I Minna send in a prescription for him in that regard. Overall though there does not appear to be any signs of significant worsening. 09/07/2020 on evaluation today patient's wounds over his left foot appear to be doing excellent. I do not see any signs of infection there is some callus buildup this can require debridement for certain but overall I feel like he is managing quite nicely. He still using the AmLactin cream which has been beneficial for him as well. Electronic Signature(s) Signed: 09/07/2020 10:27:54 AM By: Worthy Keeler PA-C Entered By: Worthy Keeler on 09/07/2020 10:27:53 -------------------------------------------------------------------------------- Physical Exam Details Patient Name: Date of Service: FRANTZ, QUATTRONE 09/07/2020 9:15 A M Medical Record Number: [id number] Patient Account Number:  [id number] Date of Birth/Sex: Treating RN: 23-Jan-1951 (70 y.o. [name] Primary Care Provider: Jilda [name] Other Clinician: Referring Provider: Treating Provider/Extender: Myles Gip in Treatment: 88 Constitutional Well-nourished and well-hydrated in no acute distress. Respiratory normal breathing without difficulty. Psychiatric this patient is able to make decisions and demonstrates good insight into disease process. Alert and Oriented x 3. pleasant and cooperative. Notes Upon inspection patient's wound bed showed signs of good granulation epithelization at this point. He did have some callus that I was able to sharply debride away. He tolerated that without complication. On the lateral wound there was some slough and biofilm buildup on the surface of the wound also clear  away today. Electronic Signature(s) Signed: 09/07/2020 10:28:10 AM By: Worthy Keeler PA-C Entered By: Worthy Keeler on 09/07/2020 10:28:10 -------------------------------------------------------------------------------- Physician Orders Details Patient Name: Date of Service: [name]. 09/07/2020 9:15 A M Medical Record Number: [id number] Patient Account Number: [id number] Date of Birth/Sex: Treating RN: Oct 31, 1950 (70 y.o. [name], [name] Primary Care Provider: Jilda [name] Other Clinician: Referring Provider: Treating Provider/Extender: Myles Gip in Treatment: 10 Verbal / Phone Orders: No Diagnosis Coding ICD-10 Coding Code Description E11.621 Type 2 diabetes mellitus with foot ulcer I89.0 Lymphedema, not elsewhere classified L97.522 Non-pressure chronic ulcer of other part of left foot with fat layer exposed I73.89 Other specified peripheral vascular diseases I10 Essential (primary) hypertension N18.30 Chronic kidney disease, stage 3 unspecified L84 Corns and callosities Follow-up Appointments Return Appointment in 1 week. Bathing/ Shower/ Hygiene May shower with protection but do not get wound dressing(s) wet. - use cast protector to cover dressing in the shower Edema Control - Lymphedema / SCD / Other Bilateral Lower Extremities Elevate legs to the level of the heart or above for 30 minutes daily and/or when sitting, a frequency of: - throughout the day Avoid standing for long periods of time. Exercise regularly Moisturize legs daily. - right leg Compression stocking or Garment 20-30 mm/Hg pressure to: - right leg daily Off-Loading Other: - healing sandal left foot. Non Wound Condition Other Non Wound Condition Orders/Instructions: - apply calcium alginate with silver to any weeping areas on left ant. leg. Wound Treatment Wound #13 - Foot Wound Laterality: Left, Medial, Distal Cleanser: Soap and Water 1 x Per Week Discharge  Instructions: May shower and wash wound with dial antibacterial soap and water prior to dressing change. Peri-Wound Care: ammonium lactate 1 x Per Week Discharge Instructions: Apply to thickened skin on leg Prim Dressing: Hydrofera Blue Classic Foam, 2x2 in 1 x Per Week ary Discharge Instructions: Moisten with saline prior to applying to wound bed Secondary Dressing: Woven Gauze Sponge, Non-Sterile 4x4 in 1 x Per Week Discharge Instructions: Apply over primary dressing as directed. Compression Wrap: FourPress (4 layer compression wrap) 1 x Per Week Discharge Instructions: Apply four layer compression as directed. Wound #17 - Metatarsal head first Wound Laterality: Left Cleanser: Soap and Water 1 x Per Week Discharge Instructions: May shower and wash wound with dial antibacterial soap and water prior to dressing change. Peri-Wound Care: ammonium lactate 1 x Per Week Discharge Instructions: Apply to thickened skin on leg Prim Dressing: Hydrofera Blue Classic Foam, 2x2 in 1 x Per Week ary Discharge Instructions: Moisten with saline prior to applying to wound bed Secondary Dressing: Woven Gauze Sponge, Non-Sterile 4x4 in 1 x Per Week Discharge Instructions: Apply over primary dressing as directed. Compression Wrap: FourPress (4 layer compression wrap) 1 x Per Week  Discharge Instructions: Apply four layer compression as directed. Electronic Signature(s) Signed: 09/07/2020 6:22:43 PM By: Worthy Keeler PA-C Signed: 09/08/2020 5:44:19 PM By: Baruch Gouty RN, BSN Entered By: Baruch Gouty on 09/07/2020 10:06:38 -------------------------------------------------------------------------------- Problem List Details Patient Name: Date of Service: [name]. 09/07/2020 9:15 A M Medical Record Number: [id number] Patient Account Number: [id number] Date of Birth/Sex: Treating RN: 15-Dec-1950 (70 y.o. 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 layer exposed 06/29/2020 No Yes I73.89 Other specified peripheral vascular diseases 06/29/2020 No Yes I10 Essential (primary) hypertension 06/29/2020 No Yes N18.30 Chronic kidney disease, stage 3 unspecified 06/29/2020 No Yes L84 Corns and callosities 08/10/2020 No Yes Inactive Problems ICD-10 Code Description Active Date Inactive Date L97.822 Non-pressure chronic ulcer of other part of left lower leg with fat layer exposed 06/29/2020 06/29/2020 Resolved Problems Electronic Signature(s) Signed: 09/07/2020 9:32:28 AM By: Worthy Keeler PA-C Entered By: Worthy Keeler on 09/07/2020 09:32:28 -------------------------------------------------------------------------------- Progress Note Details Patient Name: Date of Service: [name]. 09/07/2020 9:15 A M Medical Record Number: [id number] Patient Account Number: [id number] Date of Birth/Sex: Treating RN: Oct 09, 1950 (70 y.o. [name] Primary Care Provider: Jilda [name] Other Clinician: Referring Provider: Treating Provider/Extender: Myles Gip in Treatment: 10 Subjective Chief Complaint Information obtained from Patient Left leg and foot ulcers History of Present Illness (HPI) 10/11/17; Mr. Milosevic is a 70 year old man who tells me that in 2015 he slipped down the latter traumatizing his left leg. He developed a wound in the same spot the area that we are currently looking at. He states this closed over for the most part although he always felt it was somewhat unstable. In 2016 he hit the same area with the door of his car had this reopened. He tells me  that this is never really closed although sometimes an inflow it remains open on a constant basis. He has not been using any specific dressing to this except for topical antibiotics the nature of which were not really sure. His primary doctor did send him to see Dr. [name] of interventional cardiology. He underwent an angiogram on 08/06/17 and he underwent a PTA and directional atherectomy of the lesser distal SFA and popliteal arteries which resulted in brisk improvement in blood flow. It was noted that he had 2 vessel runoff through the anterior tibial and peroneal. He is also been to see vascular and interventional radiologist. He was not felt to have any significant superficial venous insufficiency. Presumably is not a candidate for any ablation. It was suggested he come here for wound care. The patient is a type II diabetic on insulin. He also has a history of venous insufficiency. ABIs on the left were noncompressible in our clinic 10/21/17; patient we admitted to the clinic last week. 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 We put Iodosorb on him after an aggressive debridement and 3 layer compression. He complained of pain in his ankle and itching with is skin in fact he scratched the area on the medial calf superiorly at the rim of our wraps and he has  2 small open areas in that location today which are new. I changed his primary dressing today to silver collagen. As noted he is already had revascularization and does not have any significant superficial venous insufficiency that would be amenable to ablation 10/28/17; patient admitted to the clinic 2 weeks ago. He has a smaller Wound. Scratch injury from last week revealed. There is large wound over the tibial area. This is smaller. Granulation looks healthy. No need for debridement. 11/04/17; the wound on the left lateral calf looks better. Improved dimensions. Surface of this  looks better. We've been maintaining him and Kerlix Coban wraps. He finds this much more comfortable. Silver collagen dressing 11/11/17; left lateral Wound continues to look healthy be making progress. Using a #5 curet I removed removed nonviable skin from the surface of the wound and then necrotic debris from the wound surface. Surface of the wound continues to look healthy. ooHe also has an open area on the left great toenail bed. We've been using topical antibiotics. 11/19/17; left anterior lateral wound continues to look healthy but it's not closed. ooHe also had a small wound above this on the left leg ooInitially traumatic wounds in the setting of significant chronic venous insufficiency and stasis dermatitis 11/25/17; left anterior wounds superiorly is closed still a small wound inferiorly. 12/02/17; left anterior tibial area. Arrives today with adherent callus. Post debridement clearly not completely closed. Hydrofera Blue under 3 layer compression. 12/09/17; left anterior tibia. Circumferential eschar however the wound bed looks stable to improved. We've been using Hydrofera Blue under 3 layer compression 12/17/17; left anterior tibia. Apparently this was felt to be closed however when the wrap was taken off there is a skin tear to reopen wounds in the same area we've been using Hydrofera Blue under 3 layer compression 12/23/17 left anterior tibia. Not close to close this week apparently the [facility] was stuck to this again. Still circumferential eschar requiring debridement. I put a contact layer on this this time under the Hydrofera Blue 12/31/17; left anterior tibia. Wound is better slight amount of hyper-granulation. Using Hydrofera Blue over Adaptic. 01/07/18; left anterior tibia. The wound had some surface eschar however after this was removed he has no open wound.he was already revascularized by Dr. [name] when he came to our clinic with atherectomy of the left SFA and popliteal artery.  He was also sent to interventional radiology for venous reflux studies. He was not felt to have significant reflux but certainly has chronic venous changes of his skin with hemosiderin deposition around this area. He will definitely need to lubricate his skin and wear compression stocking and I've talked to him about this. READMISSION 05/26/2018 This is a now 70 year old man we cared for with traumatic wounds on his left anterior lower extremity. He had been previously revascularized during that admission by Dr. [name]. Apparently in follow-up Dr. [name] noted that he had deterioration in his arterial status. He underwent a stent placement in the distal left SFA on 04/22/2018. 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 He went back to the angiography suite on 04/30/2018 he underwent PTA and balloon angioplasty of the occluded left mid anterior tibial artery, thrombotic occlusion went from 100 to 0% which reconstitutes the posterior tibial artery. He had thrombectomy and aspiration of the peroneal artery. The stent placed in the distal SFA left SFA was still occluded. He was discharged on Xarelto, it was noted on the discharge summary from this hospitalization that he had gangrene at the tip  of his left fifth toe and there were expectations this would auto amputate. 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 He has been recuperating at [facility] in [facility] after the most recent hospitalization. He is going home tomorrow. He tells me that 2 weeks ago he traumatized the tip of his left fifth toe. He came in urgently for our review of this. This was a history of before I noted that Dr. [name] had already noted dry gangrenous changes of the left fifth toe 06/09/2018; 2-week follow-up. I did contact Dr. [name] after his last appointment and he apparently saw 1 of Dr. Irven Shelling colleagues the next day. He does not follow-up with Dr.  [name] himself until Thursday of this week. 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 Nevertheless there is no evidence of infection no drainage and no pain. He had a new area that this week when we were signing him in today on the left anterior mid tibia area, this is in close proximity to the previous wound we have dealt with in this clinic. 06/23/2018; 2-week follow-up. I did not receive a recent note from Dr. [name] to review today. Our office is trying to obtain this. He is apparently not planning to do further vascular interventions and wondered about compression to try and help with the patient's chronic venous insufficiency. However we are also concerned about the arterial flow. ooHe arrives in clinic today with a new area on the left third toe. The areas on the calf/anterior tibia are close to closing. The left fifth toe is still mummified using Betadine. -In reviewing things with the patient he has what sounds like claudication with mild to moderate amount of activity. 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 I prescribed Levaquin over the phone while we attempted to arrange a plan of care. However the patient called yesterday to report he had low-grade fever and he came in today acutely. There is been a marked deterioration in the left third toe with spreading cellulitis up into the dorsal left foot. He was referred to the emergency room. Readmission: 06/29/2020 patient presents today for reevaluation here in our clinic he was previously treated by Dr. Dellia Nims at the latter part of 2019 in 2 the beginning of 2020. Subsequently we have not seen him since that time in the interim he did have evaluation with vein and vascular specialist specifically Dr. Anice Paganini who did perform quite extensive work for a left femoral to anterior tibial artery bypass. With that being said in the interim the patient has developed significant lymphedema and  has wounds that he tells me have really never healed in regard to the incision site on the left leg. He also has multiple wounds on the feet for various reasons some of which is that he tends to pick at his feet. Fortunately there is no signs of active infection systemically at this time he does have some wounds that are little bit deeper but most are fairly superficial he seems to have good blood flow and overall everything appears to be healthy I see no bone exposed and no obvious signs of osteomyelitis. I do not know that he necessarily needs a x-ray at this point although that something we could consider depending on how things progress. The patient does have a history of lymphedema, diabetes, this is type II, chronic kidney disease stage  III, hypertension, and history of peripheral vascular disease. 07/05/2020; patient admitted last week. Is a patient I remember from 2019 he had a spreading infection involving the left foot and we sent him to the hospital. 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 He subsequently had a left femoral to anterior tibial bypass by Dr.Cain vein and vascular. He also has severe lymphedema with chronic skin changes related to that on the left leg. The most problematic area that was new today was on the left medial great toe. This was apparently a small area last week there was purulent drainage which our intake nurse cultured. Also areas on the left medial foot and heel left lateral foot. He has 2 areas on the left medial calf left lateral calf in the setting of the severe lymphedema. 07/13/2020 on evaluation today patient appears to be doing better in my opinion compared to his last visit. The good news is there is no signs of active infection systemically and locally I do not see any signs of infection either. He did have an x-ray which was negative that is great news he had a culture which showed MRSA but at the same time he is  been on the doxycycline which has helped. I do think we may want to extend this for 7 additional days 1/25; patient admitted to the clinic a few weeks ago. 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 We have been putting him under compression his edema control is a lot better but he is severe verricused skin on the left leg. He is really done quite well he still has an open area on the left medial calf and the left medial first metatarsal head. We have been using silver collagen on the leg silver alginate on the foot 07/27/2020 upon evaluation today patient appears to be doing decently well in regard to his wounds. He still has a lot of dry skin on the left leg. Some of this is starting to peel back and I think he may be able to have them out by removing some that today. Fortunately there is no signs of active infection at this time on the left leg although on the right leg he does appear to have swelling and erythema as well as some mild warmth to touch. This does have been concerned about the possibility of cellulitis although within the differential diagnosis I do think that potentially a DVT has to be at least considered. We need to rule that out before proceeding would just call in the cellulitis. Especially since he is having pain in the posterior aspect of his calf muscle. 2/8; the patient had seen sparingly. He has severe skin changes of chronic lymphedema in the left leg thickened hyperkeratotic verrucous skin. He has an open wound on the medial part of the left first met head left mid tibia. He also has a rim of nonepithelialized skin in the anterior mid tibia. He brought in the AmLactin lotion that was been prescribed although I am not sure under compression and its utility. There concern about cellulitis on the right lower leg the last time he was here. He was put on on antibiotics. His DVT rule out was negative. The right leg looks fine he is using  his stocking on this area 08/10/2020 upon evaluation today patient appears to be doing well with regard to his leg currently. He has been tolerating the dressing  changes without complication. Fortunately there is no signs of active infection which is great news. Overall very pleased with where things stand. 2/22; the patient still has an area on the medial part of the left first met his head. This looks better than when I last saw this earlier this month he has a rim of epithelialization but still some surface debris. Mostly everything on the left leg is healed. There is still a vulnerable in the left mid tibia area. 08/30/2020 upon evaluation today patient appears to be doing much better in regard to his wounds on his foot. 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 Nonetheless I think we will address that today I Minna send in a prescription for him in that regard. Overall though there does not appear to be any signs of significant worsening. 09/07/2020 on evaluation today patient's wounds over his left foot appear to be doing excellent. I do not see any signs of infection there is some callus buildup this can require debridement for certain but overall I feel like he is managing quite nicely. He still using the AmLactin cream which has been beneficial for him as well. Objective Constitutional Well-nourished and well-hydrated in no acute distress. Vitals Time Taken: 9:28 AM, Height: 73 in, Weight: 245 lbs, BMI: 32.3, Temperature: 98.2 F, Pulse: 71 bpm, Respiratory Rate: 18 breaths/min, Blood Pressure: 130/77 mmHg, Capillary Blood Glucose: 119 mg/dl. Respiratory normal breathing without difficulty. Psychiatric this patient is able to make decisions and demonstrates good insight into disease process. Alert and Oriented x 3. pleasant and cooperative. General  Notes: Upon inspection patient's wound bed showed signs of good granulation epithelization at this point. He did have some callus that I was able to sharply debride away. He tolerated that without complication. On the lateral wound there was some slough and biofilm buildup on the surface of the wound also clear away today. Integumentary (Hair, Skin) Wound #13 status is Open. Original cause of wound was Trauma. The date acquired was: 05/25/2020. The wound has been in treatment 10 weeks. The wound is located on the Left,Distal,Medial Foot. The wound measures 0.4cm length x 0.3cm width x 0.2cm depth; 0.094cm^2 area and 0.019cm^3 volume. There is Fat Layer (Subcutaneous Tissue) exposed. There is no tunneling or undermining noted. There is a small amount of serosanguineous drainage noted. The wound margin is distinct with the outline attached to the wound base. There is large (67-100%) pink, pale granulation within the wound bed. There is a small (1-33%) amount of necrotic tissue within the wound bed including Adherent Slough. Wound #17 status is Open. Original cause of wound was Gradually Appeared. The date acquired was: 09/07/2020. The wound is located on the Left Metatarsal head first. The wound measures 0.5cm length x 0.4cm width x 0.2cm depth; 0.157cm^2 area and 0.031cm^3 volume. There is Fat Layer (Subcutaneous Tissue) exposed. There is no tunneling or undermining noted. There is a medium amount of serosanguineous drainage noted. The wound margin is flat and intact. There is large (67-100%) red granulation within the wound bed. There is no necrotic tissue within the wound bed. Assessment Active Problems ICD-10 Type 2 diabetes mellitus with foot ulcer Lymphedema, not elsewhere classified Non-pressure chronic ulcer of other part of left foot with fat layer exposed Other specified peripheral vascular diseases Essential (primary) hypertension Chronic kidney disease, stage 3 unspecified Corns and  callosities Procedures  Wound #13 Pre-procedure diagnosis of Wound #13 is a Diabetic Wound/Ulcer of the Lower Extremity located on the Left,Distal,Medial Foot .Severity of Tissue Pre Debridement is: Fat layer exposed. There was a Excisional Skin/Subcutaneous Tissue Debridement with a total area of 3.6 sq cm performed by Worthy Keeler, PA. With the following instrument(s): Curette to remove Viable and Non-Viable tissue/material. Material removed includes Callus, Subcutaneous Tissue, Slough, and Skin: Epidermis after achieving pain control using Lidocaine 5% topical ointment. No specimens were taken. A time out was conducted at 09:55, prior to the start of the procedure. A Minimum amount of bleeding was controlled with Pressure. The procedure was tolerated well with a pain level of 0 throughout and a pain level of 0 following the procedure. Post Debridement Measurements: 0.6cm length x 0.3cm width x 0.1cm depth; 0.014cm^3 volume. Character of Wound/Ulcer Post Debridement is improved. Severity of Tissue Post Debridement is: Fat layer exposed. Post procedure Diagnosis Wound #13: Same as Pre-Procedure Pre-procedure diagnosis of Wound #13 is a Diabetic Wound/Ulcer of the Lower Extremity located on the Left,Distal,Medial Foot . There was a Four Layer Compression Therapy Procedure by Rhae Hammock, RN. Post procedure Diagnosis Wound #13: Same as Pre-Procedure Wound #17 Pre-procedure diagnosis of Wound #17 is a Diabetic Wound/Ulcer of the Lower Extremity located on the Left Metatarsal head first .Severity of Tissue Pre Debridement is: Fat layer exposed. There was a Selective/Open Wound Skin/Epidermis Debridement with a total area of 1 sq cm performed by Worthy Keeler, PA. With the following instrument(s): Curette to remove Viable and Non-Viable tissue/material. Material removed includes Callus and Skin: Epidermis and after achieving pain control using Lidocaine 5% topical ointment. No specimens were  taken. A time out was conducted at 09:55, prior to the start of the procedure. A Minimum amount of bleeding was controlled with Pressure. The procedure was tolerated well with a pain level of 0 throughout and a pain level of 0 following the procedure. Post Debridement Measurements: 0.5cm length x 0.4cm width x 0.1cm depth; 0.016cm^3 volume. Character of Wound/Ulcer Post Debridement is improved. Severity of Tissue Post Debridement is: Fat layer exposed. Post procedure Diagnosis Wound #17: Same as Pre-Procedure Plan Follow-up Appointments: Return Appointment in 1 week. Bathing/ Shower/ Hygiene: May shower with protection but do not get wound dressing(s) wet. - use cast protector to cover dressing in the shower Edema Control - Lymphedema / SCD / Other: Elevate legs to the level of the heart or above for 30 minutes daily and/or when sitting, a frequency of: - throughout the day Avoid standing for long periods of time. Exercise regularly Moisturize legs daily. - right leg Compression stocking or Garment 20-30 mm/Hg pressure to: - right leg daily Off-Loading: Other: - healing sandal left foot. Non Wound Condition: Other Non Wound Condition Orders/Instructions: - apply calcium alginate with silver to any weeping areas on left ant. leg. WOUND #13: - Foot Wound Laterality: Left, Medial, Distal Cleanser: Soap and Water 1 x Per Week/ Discharge Instructions: May shower and wash wound with dial antibacterial soap and water prior to dressing change. Peri-Wound Care: ammonium lactate 1 x Per Week/ Discharge Instructions: Apply to thickened skin on leg Prim Dressing: Hydrofera Blue Classic Foam, 2x2 in 1 x Per Week/ ary Discharge Instructions: Moisten with saline prior to applying to wound bed Secondary Dressing: Woven Gauze Sponge, Non-Sterile 4x4 in 1 x Per Week/ Discharge Instructions: Apply over primary dressing as directed. Com pression Wrap: FourPress (4 layer compression wrap) 1 x Per  Week/ Discharge Instructions: Apply  four layer compression as directed. WOUND #17: - Metatarsal head first Wound Laterality: Left Cleanser: Soap and Water 1 x Per Week/ Discharge Instructions: May shower and wash wound with dial antibacterial soap and water prior to dressing change. Peri-Wound Care: ammonium lactate 1 x Per Week/ Discharge Instructions: Apply to thickened skin on leg Prim Dressing: Hydrofera Blue Classic Foam, 2x2 in 1 x Per Week/ ary Discharge Instructions: Moisten with saline prior to applying to wound bed Secondary Dressing: Woven Gauze Sponge, Non-Sterile 4x4 in 1 x Per Week/ Discharge Instructions: Apply over primary dressing as directed. Com pression Wrap: FourPress (4 layer compression wrap) 1 x Per Week/ Discharge Instructions: Apply four layer compression as directed. 1. Would recommend currently that we continue with wound care measures as before the patient's agreement with the plan this includes the use of Hydrofera Blue to both wound locations that has done well over the past week. 2. Also can continue with the ammonium lactate cream to the leg which has helped to soften some of the thickened skin. 3. Were also going to continue along with the compression wrap and again the 4-layer compression wrap does excellent for him. We will see patient back for reevaluation in 1 week here in the clinic. If anything worsens or changes patient will contact our office for additional recommendations. Electronic Signature(s) Signed: 09/07/2020 10:28:51 AM By: Worthy Keeler PA-C Entered By: Worthy Keeler on 09/07/2020 10:28:51 -------------------------------------------------------------------------------- SuperBill Details Patient Name: Date of Service: [name]. 09/07/2020 Medical Record Number: [id number] Patient Account Number: [id number] Date of Birth/Sex: Treating RN: [birth year] (69 y.o. [name] Primary Care Provider: Jilda [name] Other  Clinician: Referring Provider: Treating Provider/Extender: Myles Gip in Treatment: 10 Diagnosis Coding ICD-10 Codes Code Description [phone] Type 2 diabetes mellitus with foot ulcer I89.0 Lymphedema, not elsewhere classified L97.522 Non-pressure chronic ulcer of other part of left foot with fat layer exposed I73.89 Other specified peripheral vascular diseases I10 Essential (primary) hypertension N18.30 Chronic kidney disease, stage 3 unspecified L84 Corns and callosities Facility Procedures CPT4 Code: 41030131 Description: 43888 - DEB SUBQ TISSUE 20 SQ CM/< ICD-10 Diagnosis Description L97.522 Non-pressure chronic ulcer of other part of left foot with fat layer exposed Modifier: Quantity: 1 CPT4 Code: [id number] IC L Description: 06015 - DEBRIDE WOUND 1ST 20 SQ CM OR < D-10 Diagnosis Description 97.522 Non-pressure chronic ulcer of other part of left foot with fat layer exposed Modifier: Quantity: 1 Physician Procedures : CPT4 Code Description Modifier [id number] - WC PHYS SUBQ TISS 20 SQ CM ICD-10 Diagnosis Description L97.522 Non-pressure chronic ulcer of other part of left foot with fat layer exposed Quantity: 1 : [id number] - WC PHYS DEBR WO ANESTH 20 SQ CM ICD-10 Diagnosis Description L97.522 Non-pressure chronic ulcer of other part of left foot with fat layer exposed Quantity: 1 Electronic Signature(s) Signed: 09/07/2020 10:29:12 AM By: Worthy Keeler PA-C Entered By: Worthy Keeler on 09/07/2020 10:29:11

## 2020-09-08 NOTE — Progress Notes (Signed)
ROMAN, DUBUC ([id number]) Visit Report for 09/07/2020 Arrival Information Details Patient Name: Date of Service: EUGEAN, ARNOTT 09/07/2020 9:15 A M Medical Record Number: [id number] Patient Account Number: [id number] Date of Birth/Sex: Treating RN: 26-Oct-1950 (70 y.o. [name] Primary Care Ferron Ishmael: [name] Other Clinician: Referring Myles Tavella: Treating Caroline Matters/Extender: Myles Gip in Treatment: 10 Visit Information History Since Last Visit Added or deleted any medications: No Patient Arrived: Ambulatory Any new allergies or adverse reactions: No Arrival Time: 09:25 Had a fall or experienced change in No Accompanied By: alone activities of daily living that may affect Transfer Assistance: None risk of falls: Patient Identification Verified: Yes Signs or symptoms of abuse/neglect since last visito No Secondary Verification Process Completed: Yes Hospitalized since last visit: No Patient Requires Transmission-Based No Implantable device outside of the clinic excluding No Precautions: cellular tissue based products placed in the center Patient Has Alerts: Yes since last visit: Patient Alerts: Patient on Blood Thinner Has Dressing in Place as Prescribed: Yes 01/29/20 LABI 1.17 TBI 0.88 Has Compression in Place as Prescribed: Yes 01/29/20 R 1.23 Pain Present Now: No Electronic Signature(s) Signed: 09/08/2020 5:54:24 PM By: Levan Hurst RN, BSN Entered By: Levan Hurst on 09/07/2020 09:25:54 -------------------------------------------------------------------------------- Compression Therapy Details Patient Name: Date of Service: [name] 09/07/2020 9:15 A M Medical Record Number: [id number] Patient Account Number: [id number] Date of Birth/Sex: Treating RN: May 27, 1951 (69 y.o. [name] Primary Care Jilliane Kazanjian: [name] Other Clinician: Referring Carolann Brazell: Treating Merdis Snodgrass/Extender: Myles Gip  in Treatment: 10 Compression Therapy Performed for Wound Assessment: Wound #13 Left,Distal,Medial Foot Performed By: Clinician Rhae Hammock, RN Compression Type: Four Layer Post Procedure Diagnosis Same as Pre-procedure Electronic Signature(s) Signed: 09/08/2020 5:44:19 PM By: Baruch Gouty RN, BSN Entered By: Baruch Gouty on 09/07/2020 09:59:23 -------------------------------------------------------------------------------- Encounter Discharge Information Details Patient Name: Date of Service: [name] 09/07/2020 9:15 A M Medical Record Number: [id number] Patient Account Number: [id number] Date of Birth/Sex: Treating RN: 31-Oct-1950 (70 y.o. [name], [name] Primary Care Rosamae Rocque: [name] Other Clinician: Referring Jeree Delcid: Treating Raushanah Osmundson/Extender: Myles Gip in Treatment: 10 Encounter Discharge Information Items Post Procedure Vitals Discharge Condition: Stable Temperature (F): 98.2 Ambulatory Status: Ambulatory Pulse (bpm): 71 Discharge Destination: Home Respiratory Rate (breaths/min): 17 Transportation: Private Auto Blood Pressure (mmHg): 130/77 Accompanied By: self Schedule Follow-up Appointment: Yes Clinical Summary of Care: Patient Declined Electronic Signature(s) Signed: 09/07/2020 5:17:38 PM By: Rhae Hammock RN Entered By: Rhae Hammock on 09/07/2020 11:02:00 -------------------------------------------------------------------------------- Lower Extremity Assessment Details Patient Name: Date of Service: [name] 09/07/2020 9:15 A M Medical Record Number: [id number] Patient Account Number: [id number] Date of Birth/Sex: Treating RN: July 13, 1950 (70 y.o. [name] Primary Care Kaylor Maiers: [name] Other Clinician: Referring Ty Oshima: Treating Azarie Coriz/Extender: Myles Gip in Treatment: 10 Edema Assessment Assessed: [name]: No] [name]: No] Edema: [Left: N]  [Right: o] Calf Left: Right: Point of Measurement: 39 cm From Medial Instep 36 cm Ankle Left: Right: Point of Measurement: 12 cm From Medial Instep 24 cm Vascular Assessment Pulses: Dorsalis Pedis Palpable: [Left:Yes] Electronic Signature(s) Signed: 09/08/2020 5:54:24 PM By: Levan Hurst RN, BSN Entered By: Levan Hurst on 09/07/2020 09:36:05 -------------------------------------------------------------------------------- [place] Details Patient Name: Date of Service: [name] 09/07/2020 9:15 A M Medical Record Number: [id number] Patient Account Number: [id number] Date of Birth/Sex: Treating RN: 10-14-1950 (70 y.o. [name] Primary Care Lex Linhares: [name] Other Clinician: Referring  Dimond Crotty: Treating Burtis Imhoff/Extender: Myles Gip in Treatment: [place] reviewed with physician Active Inactive Nutrition Nursing Diagnoses: Impaired glucose control: actual or potential Potential for alteratiion in Nutrition/Potential for imbalanced nutrition Goals: Patient/caregiver agrees to and verbalizes understanding of need to use nutritional supplements and/or vitamins as prescribed Date Initiated: 06/29/2020 Date Inactivated: 07/27/2020 Target Resolution Date: 07/29/2020 Goal Status: Met Patient/caregiver will maintain therapeutic glucose control Date Initiated: 06/29/2020 Target Resolution Date: 09/24/2020 Goal Status: Active Interventions: Assess HgA1c results as ordered upon admission and as needed Assess patient nutrition upon admission and as needed per policy Provide education on elevated blood sugars and impact on wound healing Provide education on nutrition Treatment Activities: Education provided on Nutrition : 06/29/2020 Notes: Venous Leg Ulcer Nursing Diagnoses: Actual venous Insuffiency (use after diagnosis is confirmed) Knowledge deficit related to disease process and  management Goals: Patient will maintain optimal edema control Date Initiated: 06/29/2020 Target Resolution Date: 09/24/2020 Goal Status: Active Patient/caregiver will verbalize understanding of disease process and disease management Date Initiated: 06/29/2020 Date Inactivated: 07/27/2020 Target Resolution Date: 07/29/2020 Goal Status: Met Interventions: Assess peripheral edema status every visit. Compression as ordered Provide education on venous insufficiency Notes: Wound/Skin Impairment Nursing Diagnoses: Impaired tissue integrity Knowledge deficit related to ulceration/compromised skin integrity Goals: Patient/caregiver will verbalize understanding of skin care regimen Date Initiated: 06/29/2020 Target Resolution Date: 09/24/2020 Goal Status: Active Interventions: Assess patient/caregiver ability to obtain necessary supplies Assess patient/caregiver ability to perform ulcer/skin care regimen upon admission and as needed Assess ulceration(s) every visit Provide education on ulcer and skin care Notes: Electronic Signature(s) Signed: 09/08/2020 5:44:19 PM By: Baruch Gouty RN, BSN Entered By: Baruch Gouty on 09/07/2020 09:58:24 -------------------------------------------------------------------------------- Pain Assessment Details Patient Name: Date of Service: [name] 09/07/2020 9:15 A M Medical Record Number: [id number] Patient Account Number: [id number] Date of Birth/Sex: Treating RN: 09/14/1950 (70 y.o. [name] Primary Care Sian Rockers: [name] Other Clinician: Referring Doneshia Hill: Treating Mariha Sleeper/Extender: Myles Gip in Treatment: 10 Active Problems Location of Pain Severity and Description of Pain Patient Has Paino No Site Locations Pain Management and Medication Current Pain Management: Electronic Signature(s) Signed: 09/08/2020 5:54:24 PM By: Levan Hurst RN, BSN Entered By: Levan Hurst on 09/07/2020  09:26:04 -------------------------------------------------------------------------------- Patient/Caregiver Education Details Patient Name: Date of Service: [name] 3/16/2022andnbsp9:15 [place] Record Number: [id number] Patient Account Number: [id number] Date of Birth/Gender: Treating RN: [birth year] (69 y.o. [name] Primary Care Physician: [name] Other Clinician: Referring Physician: Treating Physician/Extender: Myles Gip in Treatment: 10 Education Assessment Education Provided To: Patient Education Topics Provided Elevated Blood Sugar/ Impact on Healing: Methods: Explain/Verbal Responses: Reinforcements needed, State content correctly Venous: Methods: Explain/Verbal Responses: Reinforcements needed, State content correctly Wound/Skin Impairment: Methods: Explain/Verbal Responses: Reinforcements needed, State content correctly Electronic Signature(s) Signed: 09/08/2020 5:44:19 PM By: Baruch Gouty RN, BSN Entered By: Baruch Gouty on 09/07/2020 09:58:54 -------------------------------------------------------------------------------- Wound Assessment Details Patient Name: Date of Service: [name] 09/07/2020 9:15 A M Medical Record Number: [id number] Patient Account Number: [id number] Date of Birth/Sex: Treating RN: 21-Mar-1951 (70 y.o. [name] Primary Care Pearlina Friedly: [name] Other Clinician: Referring Kedrick Mcnamee: Treating Mable Dara/Extender: Myles Gip in Treatment: 10 Wound Status Wound Number: 13 Primary Diabetic Wound/Ulcer of the Lower Extremity Etiology: Wound Location: Left, Distal, Medial Foot Wound Open Wounding Event: Trauma Status: Date Acquired: 05/25/2020 Comorbid Glaucoma, Sleep Apnea, Hypertension, Peripheral Arterial Disease, Weeks Of Treatment: 10 History:  Peripheral Venous Disease, Type II Diabetes, Gout, Osteoarthritis, Clustered Wound: No  Neuropathy Photos Wound Measurements Length: (cm) 0.4 Width: (cm) 0.3 Depth: (cm) 0.2 Area: (cm) 0.094 Volume: (cm) 0.019 Wound Description Classification: Grade 2 Wound Margin: Distinct, outline attached Exudate Amount: Small Exudate Type: Serosanguineous Exudate Color: red, brown Foul Odor After Cleansing: No Slough/Fibrino Ye % Reduction in Area: 60.2% % Reduction in Volume: 20.8% Epithelialization: Medium (34-66%) Tunneling: No Undermining: No s Wound Bed Granulation Amount: Large (67-100%) Exposed Structure Granulation Quality: Pink, Pale Fascia Exposed: No Necrotic Amount: Small (1-33%) Fat Layer (Subcutaneous Tissue) Exposed: Yes Necrotic Quality: Adherent Slough Tendon Exposed: No Muscle Exposed: No Joint Exposed: No Bone Exposed: No Treatment Notes Wound #13 (Foot) Wound Laterality: Left, Medial, Distal Cleanser Soap and Water Discharge Instruction: May shower and wash wound with dial antibacterial soap and water prior to dressing change. Peri-Wound Care ammonium lactate Discharge Instruction: Apply to thickened skin on leg Topical Primary Dressing Hydrofera Blue Classic Foam, 2x2 in Discharge Instruction: Moisten with saline prior to applying to wound bed Secondary Dressing Woven Gauze Sponge, Non-Sterile 4x4 in Discharge Instruction: Apply over primary dressing as directed. Secured With Compression Wrap FourPress (4 layer compression wrap) Discharge Instruction: Apply four layer compression as directed. Compression Stockings Add-Ons Electronic Signature(s) Signed: 09/08/2020 12:58:14 PM By: Sandre Kitty Signed: 09/08/2020 5:54:24 PM By: Levan Hurst RN, BSN Entered By: Sandre Kitty on 09/07/2020 17:04:51 -------------------------------------------------------------------------------- Wound Assessment Details Patient Name: Date of Service: [name] 09/07/2020 9:15 A M Medical Record Number: [id number] Patient Account Number:  [id number] Date of Birth/Sex: Treating RN: 10-18-1950 (70 y.o. [name] Primary Care Masiel Gentzler: [name] Other Clinician: Referring Camilia Caywood: Treating Dinna Severs/Extender: Myles Gip in Treatment: 10 Wound Status Wound Number: 17 Primary Diabetic Wound/Ulcer of the Lower Extremity Etiology: Wound Location: Left Metatarsal head first Wound Open Wounding Event: Gradually Appeared Status: Status: Date Acquired: 09/07/2020 Comorbid Glaucoma, Sleep Apnea, Hypertension, Peripheral Arterial Disease, Weeks Of Treatment: 0 History: Peripheral Venous Disease, Type II Diabetes, Gout, Osteoarthritis, Clustered Wound: No Neuropathy Photos Wound Measurements Length: (cm) 0.5 Width: (cm) 0.4 Depth: (cm) 0.2 Area: (cm) 0.157 Volume: (cm) 0.031 % Reduction in Area: 0% % Reduction in Volume: 0% Epithelialization: None Tunneling: No Undermining: No Wound Description Classification: Grade 2 Wound Margin: Flat and Intact Exudate Amount: Medium Exudate Type: Serosanguineous Exudate Color: red, brown Foul Odor After Cleansing: No Slough/Fibrino No Wound Bed Granulation Amount: Large (67-100%) Exposed Structure Granulation Quality: Red Fascia Exposed: No Necrotic Amount: None Present (0%) Fat Layer (Subcutaneous Tissue) Exposed: Yes Tendon Exposed: No Muscle Exposed: No Joint Exposed: No Bone Exposed: No Treatment Notes Wound #17 (Metatarsal head first) Wound Laterality: Left Cleanser Soap and Water Discharge Instruction: May shower and wash wound with dial antibacterial soap and water prior to dressing change. Peri-Wound Care ammonium lactate Discharge Instruction: Apply to thickened skin on leg Topical Primary Dressing Hydrofera Blue Classic Foam, 2x2 in Discharge Instruction: Moisten with saline prior to applying to wound bed Secondary Dressing Woven Gauze Sponge, Non-Sterile 4x4 in Discharge Instruction: Apply over primary dressing as  directed. Secured With Compression Wrap FourPress (4 layer compression wrap) Discharge Instruction: Apply four layer compression as directed. Compression Stockings Add-Ons Electronic Signature(s) Signed: 09/08/2020 12:58:14 PM By: Sandre Kitty Signed: 09/08/2020 5:54:24 PM By: Levan Hurst RN, BSN Entered By: Sandre Kitty on 09/07/2020 17:06:03 -------------------------------------------------------------------------------- Vitals Details Patient Name: Date of Service: [name] 09/07/2020 9:15 A M Medical Record Number: [id number] Patient Account Number: [id number]  Date of Birth/Sex: Treating RN: [birth year] (70 y.o. [name] Primary Care Denali Sharma: [name] Other Clinician: Referring Affie Gasner: Treating Masaichi Kracht/Extender: Myles Gip in Treatment: 10 Vital Signs Time Taken: 09:28 Temperature (F): 98.2 Height (in): 73 Pulse (bpm): 71 Weight (lbs): 245 Respiratory Rate (breaths/min): 18 Body Mass Index (BMI): 32.3 Blood Pressure (mmHg): 130/77 Capillary Blood Glucose (mg/dl): 119 Reference Range: 80 - 120 mg / dl Electronic Signature(s) Signed: 09/08/2020 5:54:24 PM By: Levan Hurst RN, BSN Entered By: Levan Hurst on 09/07/2020 09:29:09

## 2020-09-13 ENCOUNTER — Encounter (HOSPITAL_BASED_OUTPATIENT_CLINIC_OR_DEPARTMENT_OTHER): Payer: BC Managed Care – PPO | Admitting: Internal Medicine

## 2020-09-13 ENCOUNTER — Other Ambulatory Visit: Payer: Self-pay

## 2020-09-13 DIAGNOSIS — E11621 Type 2 diabetes mellitus with foot ulcer: Secondary | ICD-10-CM | POA: Diagnosis not present

## 2020-09-13 NOTE — Progress Notes (Signed)
[name] ([id number]) Visit Report for 09/13/2020 HPI Details [name] Name: Date of Service: [name], [name] 09/13/2020 8:30 A M Medical Record Number: [id number] [name] Account Number: [id number] Date of Birth/Sex: Treating RN: 1950-07-17 (70 y.o. [name] Primary Care Provider: Jilda [name] Other Clinician: Referring Provider: Treating Provider/Extender: [name] in Treatment: 10 History of Present Illness HPI Description: 10/11/17; [name] is a 70 year old man who tells me that in 2015 he slipped down the latter traumatizing his left leg. He developed a wound in the same spot the area that we are currently looking at. He states this closed over for the most part although he always felt it was somewhat unstable. In 2016 he hit the same area with the door of his car had this reopened. He tells me that this is never really closed although sometimes an inflow it remains open on a constant basis. He has not been using any specific dressing to this except for topical antibiotics the nature of which were not really sure. His primary doctor did send him to see Dr. [name] of interventional cardiology. He underwent an angiogram on 08/06/17 and he underwent a PTA and directional atherectomy of the lesser distal SFA and popliteal arteries which resulted in brisk improvement in blood flow. It was noted that he had 2 vessel runoff through the anterior tibial and peroneal. He is also been to see vascular and interventional radiologist. He was not felt to have any significant superficial venous insufficiency. Presumably is not a candidate for any ablation. It was suggested he come here for wound care. The [name] is a type II diabetic on insulin. He also has a history of venous insufficiency. ABIs on the left were noncompressible in our clinic 10/21/17; [name] we admitted to the clinic last week. 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 We put Iodosorb on him after an aggressive debridement and 3 layer compression. He complained of pain in his ankle and itching with is skin in fact he scratched the area on the medial calf superiorly at the rim of our wraps and he has 2 small open areas in that location today which are new. I changed his primary dressing today to silver collagen. As noted he is already had revascularization and does not have any significant superficial venous insufficiency that would be amenable to ablation 10/28/17; [name] admitted to the clinic 2 weeks ago. He has a smaller Wound. Scratch injury from last week revealed. There is large wound over the tibial area. This is smaller. Granulation looks healthy. No need for debridement. 11/04/17; the wound on the left lateral calf looks better. Improved dimensions. Surface of this looks better. We've been maintaining him and Kerlix Coban wraps. He finds this much more comfortable. Silver collagen dressing 11/11/17; left lateral Wound continues to look healthy be making progress. Using a #5 curet I removed removed nonviable skin from the surface of the wound and then necrotic debris from the wound surface. Surface of the wound continues to look healthy. He also has an open area on the left great toenail bed. We've been using topical antibiotics. 11/19/17; left anterior lateral wound continues to look healthy but it's not closed. He also had a small wound above this on the left leg Initially traumatic wounds in the setting of significant chronic venous insufficiency and stasis dermatitis 11/25/17; left anterior wounds superiorly is closed still a small wound inferiorly.  12/02/17; left anterior tibial area. Arrives today with adherent callus. Post debridement clearly not completely closed. Hydrofera Blue under 3 layer compression. 12/09/17; left anterior tibia. Circumferential eschar however the wound bed looks stable to improved. We've been  using Hydrofera Blue under 3 layer compression 12/17/17; left anterior tibia. Apparently this was felt to be closed however when the wrap was taken off there is a skin tear to reopen wounds in the same area we've been using Hydrofera Blue under 3 layer compression 12/23/17 left anterior tibia. Not close to close this week apparently the [facility] was stuck to this again. Still circumferential eschar requiring debridement. I put a contact layer on this this time under the Hydrofera Blue 12/31/17; left anterior tibia. Wound is better slight amount of hyper-granulation. Using Hydrofera Blue over Adaptic. 01/07/18; left anterior tibia. The wound had some surface eschar however after this was removed he has no open wound.he was already revascularized by Dr. [name] when he came to our clinic with atherectomy of the left SFA and popliteal artery. He was also sent to interventional radiology for venous reflux studies. He was not felt to have significant reflux but certainly has chronic venous changes of his skin with hemosiderin deposition around this area. He will definitely need to lubricate his skin and wear compression stocking and I've talked to him about this. READMISSION 05/26/2018 This is a now 70 year old man we cared for with traumatic wounds on his left anterior lower extremity. He had been previously revascularized during that admission by Dr. [name]. Apparently in follow-up Dr. [name] noted that he had deterioration in his arterial status. He underwent a stent placement in the distal left SFA on 04/22/2018. 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 He went back to the angiography suite on 04/30/2018 he underwent PTA and balloon angioplasty of the occluded left mid anterior tibial artery, thrombotic occlusion went from 100 to 0% which reconstitutes the posterior tibial artery. He had thrombectomy and aspiration of the peroneal artery. The stent placed in the distal SFA left SFA was  still occluded. 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 He has been recuperating at [facility] after the most recent hospitalization. He is going home tomorrow. He tells me that 2 weeks ago he traumatized the tip of his left fifth toe. He came in urgently for our review of this. This was a history of before I noted that Dr. [name] had already noted dry gangrenous changes of the left fifth toe 06/09/2018; 2-week follow-up. I did contact Dr. [name] after his last appointment and he apparently saw 1 of Dr. Irven Shelling colleagues the next day. He does not follow-up with Dr. [name] himself until Thursday of this week. 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 Nevertheless there is no evidence of infection no drainage and no pain. He had a new area that this week when we were signing him in today on the left anterior mid tibia area, this is in close proximity to the previous wound we have dealt with in this clinic. 06/23/2018; 2-week follow-up. I did not receive a recent note from Dr. [name] to review today. Our office is trying to obtain this. He is  apparently not planning to do further vascular interventions and wondered about compression to try and help with the [name]'s chronic venous insufficiency. However we are also concerned about the arterial flow. He arrives in clinic today with a new area on the left third toe. The areas on the calf/anterior tibia are close to closing. The left fifth toe is still mummified using Betadine. -In reviewing things with the [name] he has what sounds like claudication with mild to moderate amount of activity. 06/27/2018; x-ray of his foot suggested osteomyelitis of the left third  toe. I prescribed Levaquin over the phone while we attempted to arrange a plan of care. However the [name] called yesterday to report he had low-grade fever and he came in today acutely. There is been a marked deterioration in the left third toe with spreading cellulitis up into the dorsal left foot. He was referred to the emergency room. Readmission: 06/29/2020 [name] presents today for reevaluation here in our clinic he was previously treated by Dr. Dellia Nims at the latter part of 2019 in 2 the beginning of 2020. Subsequently we have not seen him since that time in the interim he did have evaluation with vein and vascular specialist specifically Dr. Anice Paganini who did perform quite extensive work for a left femoral to anterior tibial artery bypass. With that being said in the interim the [name] has developed significant lymphedema and has wounds that he tells me have really never healed in regard to the incision site on the left leg. He also has multiple wounds on the feet for various reasons some of which is that he tends to pick at his feet. Fortunately there is no signs of active infection systemically at this time he does have some wounds that are little bit deeper but most are fairly superficial he seems to have good blood flow and overall everything appears to be healthy I see no bone exposed and no obvious signs of osteomyelitis. I do not know that he necessarily needs a x-ray at this point although that something we could consider depending on how things progress. The [name] does have a history of lymphedema, diabetes, this is type II, chronic kidney disease stage III, hypertension, and history of peripheral vascular disease. 07/05/2020; [name] admitted last week. Is a [name] I remember from 2019 he had a spreading infection involving the left foot and we sent him to the hospital. 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 He subsequently had a left  femoral to anterior tibial bypass by Dr.Cain vein and vascular. He also has severe lymphedema with chronic skin changes related to that on the left leg. The most problematic area that was new today was on the left medial great toe. This was apparently a small area last week there was purulent drainage which our intake nurse cultured. Also areas on the left medial foot and heel left lateral foot. He has 2 areas on the left medial calf left lateral calf in the setting of the severe lymphedema. 07/13/2020 on evaluation today [name] appears to be doing better in my opinion compared to his last visit. The good news is there is no signs of active infection systemically and locally I do not see any signs of infection either. He did have an x-ray which was negative that is great news he had a culture which showed MRSA but at the same time he is been on the doxycycline which has helped. I do think we  may want to extend this for 7 additional days 1/25; [name] admitted to the clinic a few weeks ago. 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 We have been putting him under compression his edema control is a lot better but he is severe verricused skin on the left leg. He is really done quite well he still has an open area on the left medial calf and the left medial first metatarsal head. We have been using silver collagen on the leg silver alginate on the foot 07/27/2020 upon evaluation today [name] appears to be doing decently well in regard to his wounds. He still has a lot of dry skin on the left leg. Some of this is starting to peel back and I think he may be able to have them out by removing some that today. Fortunately there is no signs of active infection at this time on the left leg although on the right leg he does appear to have swelling and erythema as well as some mild warmth to touch. This does have been concerned about the possibility of cellulitis although  within the differential diagnosis I do think that potentially a DVT has to be at least considered. We need to rule that out before proceeding would just call in the cellulitis. Especially since he is having pain in the posterior aspect of his calf muscle. 2/8; the [name] had seen sparingly. He has severe skin changes of chronic lymphedema in the left leg thickened hyperkeratotic verrucous skin. He has an open wound on the medial part of the left first met head left mid tibia. He also has a rim of nonepithelialized skin in the anterior mid tibia. He brought in the AmLactin lotion that was been prescribed although I am not sure under compression and its utility. There concern about cellulitis on the right lower leg the last time he was here. He was put on on antibiotics. His DVT rule out was negative. The right leg looks fine he is using his stocking on this area 08/10/2020 upon evaluation today [name] appears to be doing well with regard to his leg currently. He has been tolerating the dressing changes without complication. Fortunately there is no signs of active infection which is great news. Overall very pleased with where things stand. 2/22; the [name] still has an area on the medial part of the left first met his head. This looks better than when I last saw this earlier this month he has a rim of epithelialization but still some surface debris. Mostly everything on the left leg is healed. There is still a vulnerable in the left mid tibia area. 08/30/2020 upon evaluation today [name] appears to be doing much better in regard to his wounds on his foot. 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 Nonetheless I think we will address that today I Minna send in a prescription for him in that regard. Overall though there does not appear to be any signs of significant  worsening. 09/07/2020 on evaluation today [name]'s wounds over his left foot appear to be doing excellent. I do not see any signs of infection there is some callus buildup this can require debridement for certain but overall I feel like he is managing quite nicely. He still using the AmLactin cream which has been beneficial for him as well. 3/22; left  foot wound is closed. There is no open area here. He is using ammonium lactate lotion to the lower extremities to help exfoliate dry cracked skin. He has compression stockings from elastic therapy in [place]. The wound on the medial part of his left first met head is healed today. Electronic Signature(s) Signed: 09/13/2020 5:50:43 PM By: Linton Ham MD Entered By: Linton Ham on 09/13/2020 09:43:45 -------------------------------------------------------------------------------- Physical Exam Details [name] Name: Date of Service: [name]. 09/13/2020 8:30 A M Medical Record Number: [id number] [name] Account Number: [id number] Date of Birth/Sex: Treating RN: [birth year] (70 y.o. [name] Primary Care Provider: Jilda [name] Other Clinician: Referring Provider: Treating Provider/Extender: [name] in Treatment: 10 Constitutional [name] is hypertensive.. Pulse regular and within target range for [name].Marland Kitchen Respirations regular, non-labored and within target range.. Temperature is normal and within the target range for the [name].Marland Kitchen Appears in no distress. Cardiovascular Pulses are palpable. Severe bilateral chronic venous insufficiency with secondary lymphedema dry fissured hyperkeratotic skin. Notes Wound exam; there is no open area on the plantar left foot. Electronic Signature(s) Signed: 09/13/2020 5:50:43 PM By: Linton Ham MD Entered By: Linton Ham on 09/13/2020 09:45:10 -------------------------------------------------------------------------------- Physician Orders  Details [name] Name: Date of Service: [name]. 09/13/2020 8:30 A M Medical Record Number: [id number] [name] Account Number: [id number] Date of Birth/Sex: Treating RN: [birth year] (70 y.o. [name], [name] Primary Care Provider: Jilda [name] Other Clinician: Referring Provider: Treating Provider/Extender: [name] in Treatment: 10 Verbal / Phone Orders: No Diagnosis Coding Follow-up Appointments Other: - No need to folllow up!:) Call us if you have any future concerns!!:) Discharge From [facility] Services Discharge from [place] Edema Control - Lymphedema / SCD / Other Elevate legs to the level of the heart or above for 30 minutes daily and/or when sitting, a frequency of: Avoid standing for long periods of time. [name] to wear own compression stockings every day. Moisturize legs daily. - Ammonium LACTATE to BLE every day. Electronic Signature(s) Signed: 09/13/2020 5:23:06 PM By: Rhae Hammock RN Signed: 09/13/2020 5:50:43 PM By: Linton Ham MD Entered By: Rhae Hammock on 09/13/2020 09:20:02 -------------------------------------------------------------------------------- Problem List Details [name] Name: Date of Service: [name]. 09/13/2020 8:30 A M Medical Record Number: [id number] [name] Account Number: [id number] Date of Birth/Sex: Treating RN: 1951-01-04 (70 y.o. [name], [name] Primary Care Provider: Jilda [name] Other Clinician: Referring Provider: Treating Provider/Extender: [name] in Treatment: 10 Active Problems ICD-10 Encounter Code Description Active Date MDM Diagnosis E11.621 Type 2 diabetes mellitus with foot ulcer 06/29/2020 No Yes I89.0 Lymphedema, not elsewhere classified 06/29/2020 No Yes L97.522 Non-pressure chronic ulcer of other part of left foot with fat layer exposed 06/29/2020 No Yes I73.89 Other specified peripheral vascular diseases 06/29/2020 No Yes I10  Essential (primary) hypertension 06/29/2020 No Yes N18.30 Chronic kidney disease, stage 3 unspecified 06/29/2020 No Yes L84 Corns and callosities 08/10/2020 No Yes Inactive Problems ICD-10 Code Description Active Date Inactive Date L97.822 Non-pressure chronic ulcer of other part of left lower leg with fat layer exposed 06/29/2020 06/29/2020 Resolved Problems Electronic Signature(s) Signed: 09/13/2020 5:50:43 PM By: Linton Ham MD Entered By: Linton Ham on 09/13/2020 09:38:43 -------------------------------------------------------------------------------- Progress Note Details [name] Name: Date of Service: [name]. 09/13/2020 8:30 A M Medical Record Number: [id number] [name] Account Number: [id number] Date of Birth/Sex: Treating RN: 03/09/1951 (70 y.o. [name], [name] Primary Care Provider: Jilda [name] Other Clinician: Referring Provider: Treating Provider/Extender: Sim Boast  Weeks in Treatment: 10 Subjective History of Present Illness (HPI) 10/11/17; [name] is a 70 year old man who tells me that in 2015 he slipped down the latter traumatizing his left leg. He developed a wound in the same spot the area that we are currently looking at. He states this closed over for the most part although he always felt it was somewhat unstable. In 2016 he hit the same area with the door of his car had this reopened. He tells me that this is never really closed although sometimes an inflow it remains open on a constant basis. He has not been using any specific dressing to this except for topical antibiotics the nature of which were not really sure. His primary doctor did send him to see Dr. [name] of interventional cardiology. He underwent an angiogram on 08/06/17 and he underwent a PTA and directional atherectomy of the lesser distal SFA and popliteal arteries which resulted in brisk improvement in blood flow. It was noted that he had 2 vessel runoff through the  anterior tibial and peroneal. He is also been to see vascular and interventional radiologist. He was not felt to have any significant superficial venous insufficiency. Presumably is not a candidate for any ablation. It was suggested he come here for wound care. The [name] is a type II diabetic on insulin. He also has a history of venous insufficiency. ABIs on the left were noncompressible in our clinic 10/21/17; [name] we admitted to the clinic last week. 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 We put Iodosorb on him after an aggressive debridement and 3 layer compression. He complained of pain in his ankle and itching with is skin in fact he scratched the area on the medial calf superiorly at the rim of our wraps and he has 2 small open areas in that location today which are new. I changed his primary dressing today to silver collagen. As noted he is already had revascularization and does not have any significant superficial venous insufficiency that would be amenable to ablation 10/28/17; [name] admitted to the clinic 2 weeks ago. He has a smaller Wound. Scratch injury from last week revealed. There is large wound over the tibial area. This is smaller. Granulation looks healthy. No need for debridement. 11/04/17; the wound on the left lateral calf looks better. Improved dimensions. Surface of this looks better. We've been maintaining him and Kerlix Coban wraps. He finds this much more comfortable. Silver collagen dressing 11/11/17; left lateral Wound continues to look healthy be making progress. Using a #5 curet I removed removed nonviable skin from the surface of the wound and then necrotic debris from the wound surface. Surface of the wound continues to look healthy. ooHe also has an open area on the left great toenail bed. We've been using topical antibiotics. 11/19/17; left anterior lateral wound continues to look healthy but  it's not closed. ooHe also had a small wound above this on the left leg ooInitially traumatic wounds in the setting of significant chronic venous insufficiency and stasis dermatitis 11/25/17; left anterior wounds superiorly is closed still a small wound inferiorly. 12/02/17; left anterior tibial area. Arrives today with adherent callus. Post debridement clearly not completely closed. Hydrofera Blue under 3 layer compression. 12/09/17; left anterior tibia. Circumferential eschar however the wound bed looks stable to improved. We've been using Hydrofera Blue under 3 layer compression 12/17/17; left anterior tibia. Apparently this was felt to be closed  however when the wrap was taken off there is a skin tear to reopen wounds in the same area we've been using Hydrofera Blue under 3 layer compression 12/23/17 left anterior tibia. Not close to close this week apparently the [facility] was stuck to this again. Still circumferential eschar requiring debridement. I put a contact layer on this this time under the Hydrofera Blue 12/31/17; left anterior tibia. Wound is better slight amount of hyper-granulation. Using Hydrofera Blue over Adaptic. 01/07/18; left anterior tibia. The wound had some surface eschar however after this was removed he has no open wound.he was already revascularized by Dr. [name] when he came to our clinic with atherectomy of the left SFA and popliteal artery. He was also sent to interventional radiology for venous reflux studies. He was not felt to have significant reflux but certainly has chronic venous changes of his skin with hemosiderin deposition around this area. He will definitely need to lubricate his skin and wear compression stocking and I've talked to him about this. READMISSION 05/26/2018 This is a now 70 year old man we cared for with traumatic wounds on his left anterior lower extremity. He had been previously revascularized during that admission by Dr. [name]. Apparently in  follow-up Dr. [name] noted that he had deterioration in his arterial status. He underwent a stent placement in the distal left SFA on 04/22/2018. 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 He went back to the angiography suite on 04/30/2018 he underwent PTA and balloon angioplasty of the occluded left mid anterior tibial artery, thrombotic occlusion went from 100 to 0% which reconstitutes the posterior tibial artery. He had thrombectomy and aspiration of the peroneal artery. The stent placed in the distal SFA left SFA was still occluded. 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 He has been recuperating at [facility] in [facility] after the most recent hospitalization. He is going home tomorrow. He tells me that 2 weeks ago he traumatized the tip of his left fifth toe. He came in urgently for our review of this. This was a history of before I noted that Dr. [name] had already noted dry gangrenous changes of the left fifth toe 06/09/2018; 2-week follow-up. I did contact Dr. [name] after his last appointment and he apparently saw 1 of Dr. Irven Shelling colleagues the next day. He does not follow-up with Dr. [name] himself until Thursday of this week. 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 Nevertheless there is no evidence of infection no drainage and no pain. He had a new area that this week when we were signing him in today on the left anterior mid tibia area, this is in close proximity to the previous wound we have dealt with in this clinic. 06/23/2018; 2-week follow-up. I did not receive a recent note from Dr. [name] to review today. Our office is trying to obtain this. He is apparently not planning to do further  vascular interventions and wondered about compression to try and help with the [name]'s chronic venous insufficiency. However we are also concerned about the arterial flow. ooHe arrives in clinic today with a new area on the left third toe. The areas on the calf/anterior tibia are close to closing. The  left fifth toe is still mummified using Betadine. -In reviewing things with the [name] he has what sounds like claudication with mild to moderate amount of activity. 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 I prescribed Levaquin over the phone while we attempted to arrange a plan of care. However the [name] called yesterday to report he had low-grade fever and he came in today acutely. There is been a marked deterioration in the left third toe with spreading cellulitis up into the dorsal left foot. He was referred to the emergency room. Readmission: 06/29/2020 [name] presents today for reevaluation here in our clinic he was previously treated by Dr. Dellia Nims at the latter part of 2019 in 2 the beginning of 2020. Subsequently we have not seen him since that time in the interim he did have evaluation with vein and vascular specialist specifically Dr. Anice Paganini who did perform quite extensive work for a left femoral to anterior tibial artery bypass. With that being said in the interim the [name] has developed significant lymphedema and has wounds that he tells me have really never healed in regard to the incision site on the left leg. He also has multiple wounds on the feet for various reasons some of which is that he tends to pick at his feet. Fortunately there is no signs of active infection systemically at this time he does have some wounds that are little bit deeper but most are fairly superficial he seems to have good blood flow and overall everything appears to be healthy I see no bone exposed and no obvious signs of osteomyelitis. I do not know that he  necessarily needs a x-ray at this point although that something we could consider depending on how things progress. The [name] does have a history of lymphedema, diabetes, this is type II, chronic kidney disease stage III, hypertension, and history of peripheral vascular disease. 07/05/2020; [name] admitted last week. Is a [name] I remember from 2019 he had a spreading infection involving the left foot and we sent him to the hospital. 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 He subsequently had a left femoral to anterior tibial bypass by Dr.[facility] vein and vascular. He also has severe lymphedema with chronic skin changes related to that on the left leg. The most problematic area that was new today was on the left medial great toe. This was apparently a small area last week there was purulent drainage which our intake nurse cultured. Also areas on the left medial foot and heel left lateral foot. He has 2 areas on the left medial calf left lateral calf in the setting of the severe lymphedema. 07/13/2020 on evaluation today [name] appears to be doing better in my opinion compared to his last visit. The good news is there is no signs of active infection systemically and locally I do not see any signs of infection either. He did have an x-ray which was negative that is great news he had a culture which showed MRSA but at the same time he is been on the doxycycline which has helped. I do think we may want to extend this for 7 additional days 1/25; [name] admitted to the clinic a few weeks ago. 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 We have been putting him under compression his edema control is a lot better but he is severe verricused skin on the left leg.  He is really done quite well he still has an open area on the left medial calf and the left medial first metatarsal head. We have been using silver collagen on the  leg silver alginate on the foot 07/27/2020 upon evaluation today [name] appears to be doing decently well in regard to his wounds. He still has a lot of dry skin on the left leg. Some of this is starting to peel back and I think he may be able to have them out by removing some that today. Fortunately there is no signs of active infection at this time on the left leg although on the right leg he does appear to have swelling and erythema as well as some mild warmth to touch. This does have been concerned about the possibility of cellulitis although within the differential diagnosis I do think that potentially a DVT has to be at least considered. We need to rule that out before proceeding would just call in the cellulitis. Especially since he is having pain in the posterior aspect of his calf muscle. 2/8; the [name] had seen sparingly. He has severe skin changes of chronic lymphedema in the left leg thickened hyperkeratotic verrucous skin. He has an open wound on the medial part of the left first met head left mid tibia. He also has a rim of nonepithelialized skin in the anterior mid tibia. He brought in the AmLactin lotion that was been prescribed although I am not sure under compression and its utility. There concern about cellulitis on the right lower leg the last time he was here. He was put on on antibiotics. His DVT rule out was negative. The right leg looks fine he is using his stocking on this area 08/10/2020 upon evaluation today [name] appears to be doing well with regard to his leg currently. He has been tolerating the dressing changes without complication. Fortunately there is no signs of active infection which is great news. Overall very pleased with where things stand. 2/22; the [name] still has an area on the medial part of the left first met his head. This looks better than when I last saw this earlier this month he has a rim of epithelialization but still some surface debris. Mostly  everything on the left leg is healed. There is still a vulnerable in the left mid tibia area. 08/30/2020 upon evaluation today [name] appears to be doing much better in regard to his wounds on his foot. 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 Nonetheless I think we will address that today I Minna send in a prescription for him in that regard. Overall though there does not appear to be any signs of significant worsening. 09/07/2020 on evaluation today [name]'s wounds over his left foot appear to be doing excellent. I do not see any signs of infection there is some callus buildup this can require debridement for certain but overall I feel like he is managing quite nicely. He still using the AmLactin cream which has been beneficial for him as well. 3/22; left foot wound is closed. There is no open area here. He is using ammonium lactate lotion to the lower extremities to help exfoliate dry cracked skin. He has compression stockings from elastic therapy in [place]. The wound on the medial part of his left first met head is healed today. Objective Constitutional [name] is hypertensive.. Pulse regular  and within target range for [name].Marland Kitchen Respirations regular, non-labored and within target range.. Temperature is normal and within the target range for the [name].Marland Kitchen Appears in no distress. Vitals Time Taken: 8:59 AM, Height: 73 in, Weight: 245 lbs, BMI: 32.3, Temperature: 97.7 F, Pulse: 65 bpm, Respiratory Rate: 18 breaths/min, Blood Pressure: 150/74 mmHg, Capillary Blood Glucose: 138 mg/dl. Cardiovascular Pulses are palpable. Severe bilateral chronic venous insufficiency with secondary lymphedema dry fissured hyperkeratotic skin. General Notes: Wound exam; there is no open area on the plantar left foot. Integumentary (Hair, Skin) Wound #13 status is Healed -  Epithelialized. Original cause of wound was Trauma. The date acquired was: 05/25/2020. The wound has been in treatment 10 weeks. The wound is located on the Left,Distal,Medial Foot. The wound measures 0cm length x 0cm width x 0cm depth; 0cm^2 area and 0cm^3 volume. There is no tunneling or undermining noted. There is a none present amount of drainage noted. There is no granulation within the wound bed. There is no necrotic tissue within the wound bed. Wound #17 status is Open. Original cause of wound was Gradually Appeared. The date acquired was: 09/07/2020. The wound is located on the Left Metatarsal head first. The wound measures 0cm length x 0cm width x 0cm depth; 0cm^2 area and 0cm^3 volume. There is no tunneling or undermining noted. There is a small amount of serosanguineous drainage noted. The wound margin is flat and intact. There is no granulation within the wound bed. There is no necrotic tissue within the wound bed. Assessment Active Problems ICD-10 Type 2 diabetes mellitus with foot ulcer Lymphedema, not elsewhere classified Non-pressure chronic ulcer of other part of left foot with fat layer exposed Other specified peripheral vascular diseases Essential (primary) hypertension Chronic kidney disease, stage 3 unspecified Corns and callosities Plan Follow-up Appointments: Other: - No need to folllow up!:) Call us if you have any future concerns!!:) Discharge From [facility] Services: Discharge from [facility] Edema Control - Lymphedema / SCD / Other: Elevate legs to the level of the heart or above for 30 minutes daily and/or when sitting, a frequency of: Avoid standing for long periods of time. [name] to wear own compression stockings every day. Moisturize legs daily. - Ammonium LACTATE to BLE every day. 1. Continue with ammonium lactate 2. Continue with his compression stockings 3. The [name] can be discharged from the wound care center. 4. He works in [employer] on his feet a  lot. Electronic Signature(s) Signed: 09/13/2020 5:50:43 PM By: Linton Ham MD Entered By: Linton Ham on 09/13/2020 09:46:07 -------------------------------------------------------------------------------- SuperBill Details [name] Name: Date of Service: [name] 09/13/2020 Medical Record Number: [id number] [name] Account Number: [id number] Date of Birth/Sex: Treating RN: 1951-02-12 (71 y.o. [name], [name] Primary Care Provider: Jilda [name] Other Clinician: Referring Provider: Treating Provider/Extender: [name] in Treatment: 10 Diagnosis Coding ICD-10 Codes Code Description [phone] Type 2 diabetes mellitus with foot ulcer I89.0 Lymphedema, not elsewhere classified L97.522 Non-pressure chronic ulcer of other part of left foot with fat layer exposed I73.89 Other specified peripheral vascular diseases I10 Essential (primary) hypertension N18.30 Chronic kidney disease, stage 3 unspecified L84 Corns and callosities Facility Procedures CPT4 Code: [id number] Description: 99214 - WOUND CARE VISIT-LEV 4 EST PT Modifier: Quantity: 1 Physician Procedures : CPT4 Code Description Modifier [id number] - WC PHYS LEVEL 3 - EST PT ICD-10 Diagnosis Description L97.522 Non-pressure chronic ulcer of other part of left foot with fat layer exposed E11.621 Type 2 diabetes mellitus with foot ulcer Quantity:  1 Electronic Signature(s) Signed: 09/13/2020 5:50:43 PM By: Linton Ham MD Entered By: Linton Ham on 09/13/2020 09:46:26

## 2020-09-13 NOTE — Progress Notes (Signed)
DODD, SCHMID ([id number]) Visit Report for 09/13/2020 Arrival Information Details Patient Name: Date of Service: [name], [name] 09/13/2020 8:30 A M Medical Record Number: [id number] Patient Account Number: [id number] Date of Birth/Sex: Treating RN: Apr 22, 1951 (70 y.o. [name] Primary Care Lyfe Monger: [name] Other Clinician: Referring Syrina Wake: Treating Lucero Auzenne/Extender: Stormy Card in Treatment: 10 Visit Information History Since Last Visit Added or deleted any medications: No Patient Arrived: Ambulatory Any new allergies or adverse reactions: No Arrival Time: 08:48 Had a fall or experienced change in No Accompanied By: self activities of daily living that may affect Transfer Assistance: None risk of falls: Patient Identification Verified: Yes Signs or symptoms of abuse/neglect since last visito No Secondary Verification Process Completed: Yes Hospitalized since last visit: No Patient Requires Transmission-Based Precautions: No Implantable device outside of the clinic excluding No Patient Has Alerts: Yes cellular tissue based products placed in the center Patient Alerts: Patient on Blood Thinner since last visit: 01/29/20 LABI 1.17 TBI 0.88 Has Dressing in Place as Prescribed: Yes 01/29/20 R 1.23 Has Compression in Place as Prescribed: Yes Pain Present Now: Yes Electronic Signature(s) Signed: 09/13/2020 5:27:17 PM By: Baruch Gouty RN, BSN Entered By: Baruch Gouty on 09/13/2020 08:51:02 -------------------------------------------------------------------------------- Clinic Level of Care Assessment Details Patient Name: Date of Service: CHRISTO, HAIN 09/13/2020 8:30 A M Medical Record Number: [id number] Patient Account Number: [id number] Date of Birth/Sex: Treating RN: 16-Mar-1951 (70 y.o. [name], [place] Primary Care Ziyan Schoon: [name] Other Clinician: Referring Elfie Costanza: Treating Patterson Hollenbaugh/Extender: Stormy Card in Treatment: 10 Clinic Level of Care Assessment Items TOOL 4 Quantity Score X- 1 0 Use when only an EandM is performed on FOLLOW-UP visit ASSESSMENTS - Nursing Assessment / Reassessment X- 1 10 Reassessment of Co-morbidities (includes updates in patient status) X- 1 5 Reassessment of Adherence to Treatment Plan ASSESSMENTS - Wound and Skin A ssessment / Reassessment X - Simple Wound Assessment / Reassessment - one wound 1 5 []  - 0 Complex Wound Assessment / Reassessment - multiple wounds X- 1 10 Dermatologic / Skin Assessment (not related to wound area) ASSESSMENTS - Focused Assessment X- 1 5 Circumferential Edema Measurements - multi extremities X- 1 10 Nutritional Assessment / Counseling / Intervention []  - 0 Lower Extremity Assessment (monofilament, tuning fork, pulses) []  - 0 Peripheral Arterial Disease Assessment (using hand held doppler) ASSESSMENTS - Ostomy and/or Continence Assessment and Care []  - 0 Incontinence Assessment and Management []  - 0 Ostomy Care Assessment and Management (repouching, etc.) PROCESS - Coordination of Care X - Simple Patient / Family Education for ongoing care 1 15 []  - 0 Complex (extensive) Patient / Family Education for ongoing care X- 1 10 Staff obtains Programmer, systems, Records, T Results / Process Orders est X- 1 10 Staff telephones HHA, Nursing Homes / Clarify orders / etc []  - 0 Routine Transfer to another Facility (non-emergent condition) []  - 0 Routine Hospital Admission (non-emergent condition) []  - 0 New Admissions / Biomedical engineer / Ordering NPWT Apligraf, etc. , []  - 0 Emergency Hospital Admission (emergent condition) X- 1 10 Simple Discharge Coordination []  - 0 Complex (extensive) Discharge Coordination PROCESS - Special Needs []  - 0 Pediatric / Minor Patient Management []  - 0 Isolation Patient Management []  - 0 Hearing / Language / Visual special needs []  - 0 Assessment of  Community assistance (transportation, D/C planning, etc.) []  - 0 Additional assistance / Altered mentation []  - 0 Support Surface(s) Assessment (bed, cushion, seat, etc.) INTERVENTIONS -  Wound Cleansing / Measurement X - Simple Wound Cleansing - one wound 1 5 []  - 0 Complex Wound Cleansing - multiple wounds X- 1 5 Wound Imaging (photographs - any number of wounds) []  - 0 Wound Tracing (instead of photographs) X- 1 5 Simple Wound Measurement - one wound []  - 0 Complex Wound Measurement - multiple wounds INTERVENTIONS - Wound Dressings X - Small Wound Dressing one or multiple wounds 1 10 []  - 0 Medium Wound Dressing one or multiple wounds []  - 0 Large Wound Dressing one or multiple wounds []  - 0 Application of Medications - topical []  - 0 Application of Medications - injection INTERVENTIONS - Miscellaneous []  - 0 External ear exam []  - 0 Specimen Collection (cultures, biopsies, blood, body fluids, etc.) []  - 0 Specimen(s) / Culture(s) sent or taken to Lab for analysis []  - 0 Patient Transfer (multiple staff / Civil Service fast streamer / Similar devices) []  - 0 Simple Staple / Suture removal (25 or less) []  - 0 Complex Staple / Suture removal (26 or more) []  - 0 Hypo / Hyperglycemic Management (close monitor of Blood Glucose) []  - 0 Ankle / Brachial Index (ABI) - do not check if billed separately X- 1 5 Vital Signs Has the patient been seen at the hospital within the last three years: Yes Total Score: 120 Level Of Care: New/Established - Level 4 Electronic Signature(s) Signed: 09/13/2020 5:23:06 PM By: Rhae Hammock RN Entered By: Rhae Hammock on 09/13/2020 09:22:10 -------------------------------------------------------------------------------- Lower Extremity Assessment Details Patient Name: Date of Service: [name] 09/13/2020 8:30 A M Medical Record Number: [id number] Patient Account Number: [id number] Date of Birth/Sex: Treating RN: 03-06-1951 (70 y.o. [name] Primary Care Morena Mckissack: [name] Other Clinician: Referring Rhea Thrun: Treating Dearl Rudden/Extender: Stormy Card in Treatment: 10 Edema Assessment Assessed: [name]: No] [name]: No] Edema: [Left: N] [Right: o] Calf Left: Right: Point of Measurement: 39 cm From Medial Instep 35.7 cm Ankle Left: Right: Point of Measurement: 12 cm From Medial Instep 24.5 cm Vascular Assessment Pulses: Dorsalis Pedis Palpable: [Left:No] Electronic Signature(s) Signed: 09/13/2020 5:27:17 PM By: Baruch Gouty RN, BSN Entered By: Baruch Gouty on 09/13/2020 09:11:32 -------------------------------------------------------------------------------- Multi Wound Chart Details Patient Name: Date of Service: [name] 09/13/2020 8:30 A M Medical Record Number: [id number] Patient Account Number: [id number] Date of Birth/Sex: Treating RN: 1951/01/05 (70 y.o. [name], [name] Primary Care Hansika Leaming: [name] Other Clinician: Referring Mick Tanguma: Treating Conchetta Lamia/Extender: Stormy Card in Treatment: 10 Vital Signs Height(in): 28 Capillary Blood Glucose(mg/dl): 138 Weight(lbs): 245 Pulse(bpm): 88 Body Mass Index(BMI): 72 Blood Pressure(mmHg): 150/74 Temperature(F): 97.7 Respiratory Rate(breaths/min): 18 Photos: [13:No Photos Left, Distal, Medial Foot] [17:No Photos Left Metatarsal head first] [N/A:N/A N/A] Wound Location: [13:Trauma] [17:Gradually Appeared] [N/A:N/A] Wounding Event: [13:Diabetic Wound/Ulcer of the Lower] [17:Diabetic Wound/Ulcer of the Lower] [N/A:N/A] Primary Etiology: [13:Extremity Glaucoma, Sleep Apnea,] [17:Extremity Glaucoma, Sleep Apnea,] [N/A:N/A] Comorbid History: [13:Hypertension, Peripheral Arterial Disease, Peripheral Venous Disease, Type II Diabetes, Gout, Osteoarthritis, Neuropathy 05/25/2020] [17:Hypertension, Peripheral Arterial Disease, Peripheral Venous Disease, Type II Diabetes, Gout,   Osteoarthritis, Neuropathy 09/07/2020] [N/A:N/A] Date Acquired: [13:10] [17:0] [N/A:N/A] Weeks of Treatment: [13:Healed - Epithelialized] [17:Open] [N/A:N/A] Wound Status: [13:0x0x0] [17:0x0x0] [N/A:N/A] Measurements L x W x D (cm) [13:0] [17:0] [N/A:N/A] A (cm) : rea [13:0] [17:0] [N/A:N/A] Volume (cm) : [13:100.00%] [17:100.00%] [N/A:N/A] % Reduction in A rea: [13:100.00%] [17:100.00%] [N/A:N/A] % Reduction in Volume: [13:Grade 2] [17:Grade 2] [N/A:N/A] Classification: [13:None Present] [17:Small] [N/A:N/A] Exudate A mount: [13:N/A] [17:Serosanguineous] [N/A:N/A] Exudate  Type: [13:N/A] [17:red, brown] [N/A:N/A] Exudate Color: [13:N/A] [17:Flat and Intact] [N/A:N/A] Wound Margin: [13:None Present (0%)] [17:None Present (0%)] [N/A:N/A] Granulation A mount: [13:None Present (0%)] [17:None Present (0%)] [N/A:N/A] Necrotic A mount: [13:Fascia: No] [17:Fascia: No] [N/A:N/A] Exposed Structures: [13:Fat Layer (Subcutaneous Tissue): No Tendon: No Muscle: No Joint: No Bone: No Large (67-100%)] [17:Fat Layer (Subcutaneous Tissue): No Tendon: No Muscle: No Joint: No Bone: No Large (67-100%)] [N/A:N/A] Treatment Notes Electronic Signature(s) Signed: 09/13/2020 5:23:06 PM By: Rhae Hammock RN Signed: 09/13/2020 5:50:43 PM By: Linton Ham MD Entered By: Linton Ham on 09/13/2020 09:42:21 -------------------------------------------------------------------------------- Multi-Disciplinary Care Plan Details Patient Name: Date of Service: [name] 09/13/2020 8:30 A M Medical Record Number: [id number] Patient Account Number: [id number] Date of Birth/Sex: Treating RN: [birth year] (70 y.o. [name], [name] Primary Care Kerria Sapien: [name] Other Clinician: Referring Zasha Belleau: Treating Kainen Struckman/Extender: Stormy Card in Treatment: 10 Multidisciplinary Care Plan reviewed with physician Active Inactive Electronic Signature(s) Signed: 09/13/2020 5:23:06  PM By: Rhae Hammock RN Entered By: Rhae Hammock on 09/13/2020 09:20:19 -------------------------------------------------------------------------------- Pain Assessment Details Patient Name: Date of Service: [name] 09/13/2020 8:30 A M Medical Record Number: [id number] Patient Account Number: [id number] Date of Birth/Sex: Treating RN: 06-27-1950 (70 y.o. [name] Primary Care Johnjoseph Rolfe: [name] Other Clinician: Referring Gared Gillie: Treating Anik Wesch/Extender: Stormy Card in Treatment: 10 Active Problems Location of Pain Severity and Description of Pain Patient Has Paino No Site Locations Pain Management and Medication Current Pain Management: Electronic Signature(s) Signed: 09/13/2020 5:27:17 PM By: Baruch Gouty RN, BSN Entered By: Baruch Gouty on 09/13/2020 09:00:38 -------------------------------------------------------------------------------- Patient/Caregiver Education Details Patient Name: Date of Service: [place], [name] 3/22/2022andnbsp8:30 A M Medical Record Number: [id number] Patient Account Number: [id number] Date of Birth/Gender: Treating RN: 09/10/1950 (69 y.o. [name] Primary Care Physician: [name] Other Clinician: Referring Physician: Treating Physician/Extender: Stormy Card in Treatment: 10 Education Assessment Education Provided To: Patient Education Topics Provided Wound/Skin Impairment: Methods: Explain/Verbal Responses: State content correctly Electronic Signature(s) Signed: 09/13/2020 5:23:06 PM By: Rhae Hammock RN Entered By: Rhae Hammock on 09/13/2020 09:21:18 -------------------------------------------------------------------------------- Wound Assessment Details Patient Name: Date of Service: [name] 09/13/2020 8:30 A M Medical Record Number: [id number] Patient Account Number: [id number] Date of Birth/Sex: Treating  RN: January 05, 1951 (69 y.o. [name] Primary Care Kaavya Puskarich: [name] Other Clinician: Referring Havyn Ramo: Treating Pattiann Solanki/Extender: Stormy Card in Treatment: 10 Wound Status Wound Number: 13 Primary Diabetic Wound/Ulcer of the Lower Extremity Etiology: Wound Location: Left, Distal, Medial Foot Wound Healed - Epithelialized Wounding Event: Trauma Status: Date Acquired: 05/25/2020 Comorbid Glaucoma, Sleep Apnea, Hypertension, Peripheral Arterial Disease, Weeks Of Treatment: 10 History: Peripheral Venous Disease, Type II Diabetes, Gout, Osteoarthritis, Clustered Wound: No Neuropathy Wound Measurements Length: (cm) Width: (cm) Depth: (cm) Area: (cm) Volume: (cm) 0 % Reduction in Area: 100% 0 % Reduction in Volume: 100% 0 Epithelialization: Large (67-100%) 0 Tunneling: No 0 Undermining: No Wound Description Classification: Grade 2 Exudate Amount: None Present Foul Odor After Cleansing: No Slough/Fibrino No Wound Bed Granulation Amount: None Present (0%) Exposed Structure Necrotic Amount: None Present (0%) Fascia Exposed: No Fat Layer (Subcutaneous Tissue) Exposed: No Tendon Exposed: No Muscle Exposed: No Joint Exposed: No Bone Exposed: No Electronic Signature(s) Signed: 09/13/2020 5:27:17 PM By: Baruch Gouty RN, BSN Entered By: Baruch Gouty on 09/13/2020 09:11:58 -------------------------------------------------------------------------------- Wound Assessment Details Patient Name: Date of Service: [name] 09/13/2020 8:30 A M Medical Record Number: [id number]  Patient Account Number: [id number] Date of Birth/Sex: Treating RN: December 17, 1950 (70 y.o. [name] Primary Care Tredarius Cobern: [name] Other Clinician: Referring Jonta Gastineau: Treating Marlene Pfluger/Extender: Stormy Card in Treatment: 10 Wound Status Wound Number: 17 Primary Diabetic Wound/Ulcer of the Lower Extremity Etiology: Wound  Location: Left Metatarsal head first Wound Open Wounding Event: Gradually Appeared Status: Date Acquired: 09/07/2020 Comorbid Glaucoma, Sleep Apnea, Hypertension, Peripheral Arterial Disease, Weeks Of Treatment: 0 History: Peripheral Venous Disease, Type II Diabetes, Gout, Osteoarthritis, Clustered Wound: No Neuropathy Wound Measurements Length: (cm) Width: (cm) Depth: (cm) Area: (cm) Volume: (cm) 0 % Reduction in Area: 100% 0 % Reduction in Volume: 100% 0 Epithelialization: Large (67-100%) 0 Tunneling: No 0 Undermining: No Wound Description Classification: Grade 2 Wound Margin: Flat and Intact Exudate Amount: Small Exudate Type: Serosanguineous Exudate Color: red, brown Foul Odor After Cleansing: No Slough/Fibrino No Wound Bed Granulation Amount: None Present (0%) Exposed Structure Necrotic Amount: None Present (0%) Fascia Exposed: No Fat Layer (Subcutaneous Tissue) Exposed: No Tendon Exposed: No Muscle Exposed: No Joint Exposed: No Bone Exposed: No Electronic Signature(s) Signed: 09/13/2020 5:27:17 PM By: Baruch Gouty RN, BSN Entered By: Baruch Gouty on 09/13/2020 09:12:36 -------------------------------------------------------------------------------- Vitals Details Patient Name: Date of Service: [name] 09/13/2020 8:30 A M Medical Record Number: [id number] Patient Account Number: [id number] Date of Birth/Sex: Treating RN: May 15, 1951 (69 y.o. [name] Primary Care Wayman [name]: [name] Other Clinician: Referring Kyon Bentler: Treating Chen Holzman/Extender: Stormy Card in Treatment: 10 Vital Signs Time Taken: 08:59 Temperature (F): 97.7 Height (in): 73 Pulse (bpm): 65 Weight (lbs): 245 Respiratory Rate (breaths/min): 18 Body Mass Index (BMI): 32.3 Blood Pressure (mmHg): 150/74 Capillary Blood Glucose (mg/dl): 138 Reference Range: 80 - 120 mg / dl Electronic Signature(s) Signed: 09/13/2020 5:27:17 PM By:  Baruch Gouty RN, BSN Entered By: Baruch Gouty on 09/13/2020 09:00:31

## 2020-09-23 ENCOUNTER — Other Ambulatory Visit: Payer: Self-pay

## 2020-09-23 ENCOUNTER — Encounter: Payer: Self-pay | Admitting: Vascular Surgery

## 2020-09-23 ENCOUNTER — Ambulatory Visit (INDEPENDENT_AMBULATORY_CARE_PROVIDER_SITE_OTHER): Payer: BC Managed Care – PPO | Admitting: Vascular Surgery

## 2020-09-23 ENCOUNTER — Ambulatory Visit (INDEPENDENT_AMBULATORY_CARE_PROVIDER_SITE_OTHER)
Admission: RE | Admit: 2020-09-23 | Discharge: 2020-09-23 | Disposition: A | Discharge status: Home / Self Care | Payer: BC Managed Care – PPO | Source: Ambulatory Visit | Attending: Vascular Surgery | Admitting: Vascular Surgery

## 2020-09-23 ENCOUNTER — Ambulatory Visit (HOSPITAL_COMMUNITY)
Admission: RE | Admit: 2020-09-23 | Discharge: 2020-09-23 | Disposition: A | Discharge status: Home / Self Care | Payer: BC Managed Care – PPO | Source: Ambulatory Visit | Attending: Vascular Surgery | Admitting: Vascular Surgery

## 2020-09-23 VITALS — BP 142/91 | HR 67 | Temp 97.9°F | Resp 20 | Ht 74.0 in | Wt 250.6 lb

## 2020-09-23 DIAGNOSIS — I779 Disorder of arteries and arterioles, unspecified: Secondary | ICD-10-CM | POA: Diagnosis present

## 2020-09-23 DIAGNOSIS — I739 Peripheral vascular disease, unspecified: Secondary | ICD-10-CM | POA: Diagnosis not present

## 2020-09-23 DIAGNOSIS — M7989 Other specified soft tissue disorders: Secondary | ICD-10-CM | POA: Diagnosis not present

## 2020-09-23 DIAGNOSIS — M79662 Pain in left lower leg: Secondary | ICD-10-CM | POA: Diagnosis not present
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
## 2020-09-27 ENCOUNTER — Other Ambulatory Visit: Payer: Self-pay

## 2020-09-27 DIAGNOSIS — I739 Peripheral vascular disease, unspecified: Secondary | ICD-10-CM

## 2021-03-16 ENCOUNTER — Other Ambulatory Visit: Payer: Self-pay | Admitting: *Deleted

## 2021-03-16 DIAGNOSIS — I739 Peripheral vascular disease, unspecified: Secondary | ICD-10-CM

## 2021-03-16 DIAGNOSIS — M79662 Pain in left lower leg: Secondary | ICD-10-CM

## 2021-03-16 DIAGNOSIS — M7989 Other specified soft tissue disorders: Secondary | ICD-10-CM

## 2021-03-16 DIAGNOSIS — I70229 Atherosclerosis of native arteries of extremities with rest pain, unspecified extremity: Secondary | ICD-10-CM

## 2021-03-22 ENCOUNTER — Other Ambulatory Visit: Payer: Self-pay

## 2021-03-22 ENCOUNTER — Ambulatory Visit (HOSPITAL_COMMUNITY)
Admission: RE | Admit: 2021-03-22 | Discharge: 2021-03-22 | Disposition: A | Discharge status: Home / Self Care | Payer: BC Managed Care – PPO | Source: Ambulatory Visit | Attending: Vascular Surgery | Admitting: Vascular Surgery

## 2021-03-22 ENCOUNTER — Ambulatory Visit (INDEPENDENT_AMBULATORY_CARE_PROVIDER_SITE_OTHER): Payer: BC Managed Care – PPO | Admitting: Physician Assistant

## 2021-03-22 ENCOUNTER — Ambulatory Visit (INDEPENDENT_AMBULATORY_CARE_PROVIDER_SITE_OTHER)
Admission: RE | Admit: 2021-03-22 | Discharge: 2021-03-22 | Disposition: A | Discharge status: Home / Self Care | Payer: BC Managed Care – PPO | Source: Ambulatory Visit | Attending: Vascular Surgery | Admitting: Vascular Surgery

## 2021-03-22 VITALS — BP 145/80 | HR 69 | Temp 97.2°F | Resp 20 | Ht 74.0 in | Wt 257.1 lb

## 2021-03-22 DIAGNOSIS — I739 Peripheral vascular disease, unspecified: Secondary | ICD-10-CM | POA: Insufficient documentation

## 2021-03-22 DIAGNOSIS — I70229 Atherosclerosis of native arteries of extremities with rest pain, unspecified extremity: Secondary | ICD-10-CM | POA: Insufficient documentation

## 2021-03-22 DIAGNOSIS — M79662 Pain in left lower leg: Secondary | ICD-10-CM

## 2021-03-22 DIAGNOSIS — M7989 Other specified soft tissue disorders: Secondary | ICD-10-CM | POA: Diagnosis not present

## 2021-03-22 DIAGNOSIS — S81802D Unspecified open wound, left lower leg, subsequent encounter: Secondary | ICD-10-CM

## 2021-03-22 DIAGNOSIS — I878 Other specified disorders of veins: Secondary | ICD-10-CM | POA: Diagnosis not present
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
## 2021-03-27 ENCOUNTER — Encounter (HOSPITAL_COMMUNITY): Payer: Self-pay | Admitting: Vascular Surgery

## 2021-03-27 ENCOUNTER — Encounter (HOSPITAL_COMMUNITY)
Admission: RE | Disposition: A | Discharge status: Home / Self Care | Payer: Self-pay | Source: Home / Self Care | Attending: Vascular Surgery

## 2021-03-27 ENCOUNTER — Ambulatory Visit (HOSPITAL_COMMUNITY)
Admission: RE | Admit: 2021-03-27 | Discharge: 2021-03-27 | Disposition: A | Discharge status: Home / Self Care | Payer: BC Managed Care – PPO | Attending: Vascular Surgery | Admitting: Vascular Surgery

## 2021-03-27 DIAGNOSIS — I129 Hypertensive chronic kidney disease with stage 1 through stage 4 chronic kidney disease, or unspecified chronic kidney disease: Secondary | ICD-10-CM | POA: Diagnosis not present

## 2021-03-27 DIAGNOSIS — Z79899 Other long term (current) drug therapy: Secondary | ICD-10-CM | POA: Insufficient documentation

## 2021-03-27 DIAGNOSIS — E1151 Type 2 diabetes mellitus with diabetic peripheral angiopathy without gangrene: Secondary | ICD-10-CM | POA: Insufficient documentation

## 2021-03-27 DIAGNOSIS — Z7902 Long term (current) use of antithrombotics/antiplatelets: Secondary | ICD-10-CM | POA: Insufficient documentation

## 2021-03-27 DIAGNOSIS — I70245 Atherosclerosis of native arteries of left leg with ulceration of other part of foot: Secondary | ICD-10-CM | POA: Insufficient documentation

## 2021-03-27 DIAGNOSIS — Z794 Long term (current) use of insulin: Secondary | ICD-10-CM | POA: Diagnosis not present

## 2021-03-27 DIAGNOSIS — E11621 Type 2 diabetes mellitus with foot ulcer: Secondary | ICD-10-CM | POA: Insufficient documentation

## 2021-03-27 DIAGNOSIS — Z9641 Presence of insulin pump (external) (internal): Secondary | ICD-10-CM | POA: Diagnosis not present

## 2021-03-27 DIAGNOSIS — N183 Chronic kidney disease, stage 3 unspecified: Secondary | ICD-10-CM | POA: Diagnosis not present

## 2021-03-27 DIAGNOSIS — E1122 Type 2 diabetes mellitus with diabetic chronic kidney disease: Secondary | ICD-10-CM | POA: Insufficient documentation

## 2021-03-27 DIAGNOSIS — L97529 Non-pressure chronic ulcer of other part of left foot with unspecified severity: Secondary | ICD-10-CM | POA: Insufficient documentation

## 2021-03-27 DIAGNOSIS — Z87891 Personal history of nicotine dependence: Secondary | ICD-10-CM | POA: Diagnosis not present

## 2021-03-27 DIAGNOSIS — I70248 Atherosclerosis of native arteries of left leg with ulceration of other part of lower left leg: Secondary | ICD-10-CM | POA: Diagnosis not present

## 2021-03-27 DIAGNOSIS — L97829 Non-pressure chronic ulcer of other part of left lower leg with unspecified severity: Secondary | ICD-10-CM | POA: Diagnosis not present

## 2021-03-27 DIAGNOSIS — I70249 Atherosclerosis of native arteries of left leg with ulceration of unspecified site: Secondary | ICD-10-CM

## 2021-03-27 HISTORY — PX: ABDOMINAL AORTOGRAM W/LOWER EXTREMITY: CATH118223

## 2021-03-27 LAB — POCT I-STAT, CHEM 8
BUN: 23 mg/dL (ref 8–23)
Calcium, Ion: 1.2 mmol/L (ref 1.15–1.40)
Chloride: 104 mmol/L (ref 98–111)
Creatinine, Ser: 1.4 mg/dL — ABNORMAL HIGH (ref 0.61–1.24)
Glucose, Bld: 142 mg/dL — ABNORMAL HIGH (ref 70–99)
HCT: 30 % — ABNORMAL LOW (ref 39.0–52.0)
Hemoglobin: 10.2 g/dL — ABNORMAL LOW (ref 13.0–17.0)
Potassium: 4.4 mmol/L (ref 3.5–5.1)
Sodium: 136 mmol/L (ref 135–145)
TCO2: 24 mmol/L (ref 22–32)

## 2021-03-27 LAB — GLUCOSE, CAPILLARY: Glucose-Capillary: 138 mg/dL — ABNORMAL HIGH (ref 70–99)

## 2021-03-27 SURGERY — ABDOMINAL AORTOGRAM W/LOWER EXTREMITY
Anesthesia: LOCAL

## 2021-03-27 MED ORDER — SODIUM CHLORIDE 0.9% FLUSH: 3.0000 mL | INTRAVENOUS | Status: DC | PRN

## 2021-03-27 MED ORDER — SODIUM CHLORIDE 0.9% FLUSH: 3.0000 mL | Freq: Two times a day (BID) | INTRAVENOUS | Status: DC

## 2021-03-27 MED ORDER — FENTANYL CITRATE (PF) 100 MCG/2ML IJ SOLN
INTRAMUSCULAR | Status: DC | PRN
  Administered 2021-03-27: 25 ug via INTRAVENOUS

## 2021-03-27 MED ORDER — HEPARIN (PORCINE) IN NACL 1000-0.9 UT/500ML-% IV SOLN
INTRAVENOUS | Status: AC
  Filled 2021-03-27: qty 1000

## 2021-03-27 MED ORDER — LABETALOL HCL 5 MG/ML IV SOLN: 10.0000 mg | INTRAVENOUS | Status: DC | PRN

## 2021-03-27 MED ORDER — ONDANSETRON HCL 4 MG/2ML IJ SOLN: 4.0000 mg | Freq: Four times a day (QID) | INTRAMUSCULAR | Status: DC | PRN

## 2021-03-27 MED ORDER — HYDRALAZINE HCL 20 MG/ML IJ SOLN
5.0000 mg | INTRAMUSCULAR | Status: DC | PRN
Start: 2021-03-27 — End: 2021-03-27

## 2021-03-27 MED ORDER — FENTANYL CITRATE (PF) 100 MCG/2ML IJ SOLN
INTRAMUSCULAR | Status: AC
  Filled 2021-03-27: qty 2

## 2021-03-27 MED ORDER — HEPARIN (PORCINE) IN NACL 1000-0.9 UT/500ML-% IV SOLN
INTRAVENOUS | Status: DC | PRN
  Administered 2021-03-27 (×2): 500 mL

## 2021-03-27 MED ORDER — MORPHINE SULFATE (PF) 2 MG/ML IV SOLN: 2.0000 mg | INTRAVENOUS | Status: DC | PRN

## 2021-03-27 MED ORDER — SODIUM CHLORIDE 0.9 % IV SOLN: INTRAVENOUS | Status: DC

## 2021-03-27 MED ORDER — OXYCODONE HCL 5 MG PO TABS: 5.0000 mg | ORAL_TABLET | ORAL | Status: DC | PRN

## 2021-03-27 MED ORDER — SODIUM CHLORIDE 0.9 % IV SOLN: 250.0000 mL | INTRAVENOUS | Status: DC | PRN

## 2021-03-27 MED ORDER — MIDAZOLAM HCL 2 MG/2ML IJ SOLN
INTRAMUSCULAR | Status: DC | PRN
  Administered 2021-03-27: 1 mg via INTRAVENOUS

## 2021-03-27 MED ORDER — ACETAMINOPHEN 325 MG PO TABS: 650.0000 mg | ORAL_TABLET | ORAL | Status: DC | PRN

## 2021-03-27 MED ORDER — SODIUM CHLORIDE 0.9 % WEIGHT BASED INFUSION: 1.0000 mL/kg/h | INTRAVENOUS | Status: DC

## 2021-03-27 MED ORDER — LIDOCAINE HCL (PF) 1 % IJ SOLN
INTRAMUSCULAR | Status: AC
  Filled 2021-03-27: qty 30

## 2021-03-27 MED ORDER — MIDAZOLAM HCL 2 MG/2ML IJ SOLN
INTRAMUSCULAR | Status: AC
  Filled 2021-03-27: qty 2

## 2021-03-27 MED ORDER — IODIXANOL 320 MG/ML IV SOLN
INTRAVENOUS | Status: DC | PRN
  Administered 2021-03-27: 70 mL

## 2021-03-27 MED ORDER — LIDOCAINE HCL (PF) 1 % IJ SOLN
INTRAMUSCULAR | Status: DC | PRN
  Administered 2021-03-27: 18 mL

## 2021-03-27 SURGICAL SUPPLY — 9 items
CATH ANGIO 5F PIGTAIL 65CM (CATHETERS) ×2 IMPLANT
CLOSURE MYNX CONTROL 5F (Vascular Products) ×2 IMPLANT
KIT PV (KITS) ×2 IMPLANT
SHEATH PINNACLE 5F 10CM (SHEATH) ×2 IMPLANT
SHEATH PROBE COVER 6X72 (BAG) ×2 IMPLANT
SYR MEDRAD MARK V 150ML (SYRINGE) ×2 IMPLANT
TRANSDUCER W/STOPCOCK (MISCELLANEOUS) ×2 IMPLANT
TRAY PV CATH (CUSTOM PROCEDURE TRAY) ×2 IMPLANT
WIRE BENTSON .035X145CM (WIRE) ×2 IMPLANT

## 2021-03-27 NOTE — Op Note (Signed)
    Patient name: [name] MRN: [id number] DOB: 09/06/1950 Sex: male  03/27/2021 Pre-operative Diagnosis: Left lower extremity wounds Post-operative diagnosis:  Same Surgeon:  Erlene Quan C. Donzetta Matters, MD Procedure Performed: 1.  Ultrasound-guided cannulation right common femoral artery 2.  Aortogram 3.  Selection of left common femoral artery and left lower extremity angiogram 4.  Mynx device closure right common femoral artery 5.  Moderate sedation with fentanyl and Versed for 18 minutes  Indications: 70 year old male with previous SFA to anterior tibial artery bypass.  He has worsening wounds of the left lower extremity he is indicated for angiography with possible intervention.  Findings: The aorta and iliac segments were free of flow-limiting stenosis.  The left common femoral artery was selected.  Left lower extremity angiography runoff via the anterior tibial artery was unobstructed.  The bypass was patent without any areas of stenosis.  The right common femoral artery was healthy and was closed at the area of access.   Procedure:  The patient was identified in the holding area and taken to room 8.  The patient was then placed supine on the table and prepped and draped in the usual sterile fashion.  A time out was called.  Ultrasound was used to evaluate the right common femoral artery which was noted to be patent and large.  There is anesthetized 1% lidocaine cannulated with 18-gauge needle followed by a Bentson wire and 5 French sheath.  And images saved the permanent record.  A pigtail catheter was placed to the level of L1 aortogram was performed.  We then crossed the bifurcation selected the left common femoral artery perform left lower extremity angiography as well as spot angiography of the distal anastomosis of the bypass.  There is no identifiable stenoses.  There is no role for intervention.  The catheter was removed over wire.  Mynx device closure was deployed which he tolerated well  there was no bleeding.  There were no immediate complications.   Contrast: 70cc  Kentarius Partington C. Donzetta Matters, MD [facility] Office: [phone] Pager: [phone]

## 2021-03-27 NOTE — Interval H&P Note (Signed)
History and Physical Interval Note:  03/27/2021 7:25 AM  [name]  has presented today for surgery, with the diagnosis of PAD.  The various methods of treatment have been discussed with the patient and family. After consideration of risks, benefits and other options for treatment, the patient has consented to  Procedure(s): ABDOMINAL AORTOGRAM W/LOWER EXTREMITY (N/A) as a surgical intervention.  The patient's history has been reviewed, patient examined, no change in status, stable for surgery.  I have reviewed the patient's chart and labs.  Questions were answered to the patient's satisfaction.     [name]

## 2021-04-12 ENCOUNTER — Encounter (HOSPITAL_BASED_OUTPATIENT_CLINIC_OR_DEPARTMENT_OTHER): Payer: BC Managed Care – PPO | Attending: Internal Medicine | Admitting: Internal Medicine

## 2021-04-12 ENCOUNTER — Other Ambulatory Visit: Payer: Self-pay

## 2021-04-12 DIAGNOSIS — E1142 Type 2 diabetes mellitus with diabetic polyneuropathy: Secondary | ICD-10-CM | POA: Diagnosis not present

## 2021-04-12 DIAGNOSIS — I87332 Chronic venous hypertension (idiopathic) with ulcer and inflammation of left lower extremity: Secondary | ICD-10-CM | POA: Diagnosis not present

## 2021-04-12 DIAGNOSIS — L97528 Non-pressure chronic ulcer of other part of left foot with other specified severity: Secondary | ICD-10-CM | POA: Diagnosis not present

## 2021-04-12 DIAGNOSIS — E11621 Type 2 diabetes mellitus with foot ulcer: Secondary | ICD-10-CM | POA: Insufficient documentation

## 2021-04-12 DIAGNOSIS — E1151 Type 2 diabetes mellitus with diabetic peripheral angiopathy without gangrene: Secondary | ICD-10-CM | POA: Diagnosis not present

## 2021-04-12 DIAGNOSIS — I890 Lymphedema, not elsewhere classified: Secondary | ICD-10-CM | POA: Insufficient documentation

## 2021-04-12 DIAGNOSIS — Z794 Long term (current) use of insulin: Secondary | ICD-10-CM | POA: Diagnosis not present

## 2021-04-12 DIAGNOSIS — L97822 Non-pressure chronic ulcer of other part of left lower leg with fat layer exposed: Secondary | ICD-10-CM | POA: Insufficient documentation

## 2021-04-18 NOTE — Progress Notes (Signed)
[name], [name] ([id number]) Visit Report for 04/12/2021 Chief Complaint Document Details Patient Name: Date of Service: [name], [name] 04/12/2021 1:15 PM Medical Record Number: [id number] Patient Account Number: [id number] Date of Birth/Sex: Treating RN: [birth year] (70 y.o. [name] Primary Care Provider: [name] Other Clinician: Referring Provider: Treating Provider/Extender: Stormy Card in Treatment: 0 Information Obtained from: Patient Chief Complaint Left leg and foot ulcers 04/12/2021; patient is here for wounds on his left lower leg and left plantar foot over the first metatarsal head Electronic Signature(s) Signed: 04/12/2021 4:24:04 PM By: Linton Ham MD Entered By: Linton Ham on 04/12/2021 13:54:38 -------------------------------------------------------------------------------- HPI Details Patient Name: Date of Service: [name]. 04/12/2021 1:15 PM Medical Record Number: [id number] Patient Account Number: [id number] Date of Birth/Sex: Treating RN: Nov 09, 1950 (70 y.o. [name] Primary Care Provider: [name] Other Clinician: Referring Provider: Treating Provider/Extender: Stormy Card in Treatment: 0 History of Present Illness HPI Description: 10/11/17; [name] is a 70 year old man who tells me that in 2015 he slipped down the latter traumatizing his left leg. He developed a wound in the same spot the area that we are currently looking at. He states this closed over for the most part although he always felt it was somewhat unstable. In 2016 he hit the same area with the door of his car had this reopened. He tells me that this is never really closed although sometimes an inflow it remains open on a constant basis. He has not been using any specific dressing to this except for topical antibiotics the nature of which were not really sure. His primary doctor did send him to see Dr. [name] of  interventional cardiology. He underwent an angiogram on 08/06/17 and he underwent a PTA and directional atherectomy of the lesser distal SFA and popliteal arteries which resulted in brisk improvement in blood flow. It was noted that he had 2 vessel runoff through the anterior tibial and peroneal. He is also been to see vascular and interventional radiologist. He was not felt to have any significant superficial venous insufficiency. Presumably is not a candidate for any ablation. It was suggested he come here for wound care. The patient is a type II diabetic on insulin. He also has a history of venous insufficiency. ABIs on the left were noncompressible in our clinic 10/21/17; patient we admitted to the clinic last week. 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 We put Iodosorb on him after an aggressive debridement and 3 layer compression. He complained of pain in his ankle and itching with is skin in fact he scratched the area on the medial calf superiorly at the rim of our wraps and he has 2 small open areas in that location today which are new. I changed his primary dressing today to silver collagen. As noted he is already had revascularization and does not have any significant superficial venous insufficiency that would be amenable to ablation 10/28/17; patient admitted to the clinic 2 weeks ago. He has a smaller Wound. Scratch injury from last week revealed. There is large wound over the tibial area. This is smaller. Granulation looks healthy. No need for debridement. 11/04/17; the wound on the left lateral calf looks better. Improved dimensions. Surface of this looks better. We've been maintaining him and Kerlix Coban wraps. He finds this much more comfortable. Silver collagen dressing 11/11/17; left lateral Wound continues to look healthy  be making progress. Using a #5 curet I removed removed nonviable skin from the surface of the  wound and then necrotic debris from the wound surface. Surface of the wound continues to look healthy. He also has an open area on the left great toenail bed. We've been using topical antibiotics. 11/19/17; left anterior lateral wound continues to look healthy but it's not closed. He also had a small wound above this on the left leg Initially traumatic wounds in the setting of significant chronic venous insufficiency and stasis dermatitis 11/25/17; left anterior wounds superiorly is closed still a small wound inferiorly. 12/02/17; left anterior tibial area. Arrives today with adherent callus. Post debridement clearly not completely closed. Hydrofera Blue under 3 layer compression. 12/09/17; left anterior tibia. Circumferential eschar however the wound bed looks stable to improved. We've been using Hydrofera Blue under 3 layer compression 12/17/17; left anterior tibia. Apparently this was felt to be closed however when the wrap was taken off there is a skin tear to reopen wounds in the same area we've been using Hydrofera Blue under 3 layer compression 12/23/17 left anterior tibia. Not close to close this week apparently the [employer] was stuck to this again. Still circumferential eschar requiring debridement. I put a contact layer on this this time under the Hydrofera Blue 12/31/17; left anterior tibia. Wound is better slight amount of hyper-granulation. Using Hydrofera Blue over Adaptic. 01/07/18; left anterior tibia. The wound had some surface eschar however after this was removed he has no open wound.he was already revascularized by Dr. [name] when he came to our clinic with atherectomy of the left SFA and popliteal artery. He was also sent to interventional radiology for venous reflux studies. He was not felt to have significant reflux but certainly has chronic venous changes of his skin with hemosiderin deposition around this area. He will definitely need to lubricate his skin and wear compression  stocking and I've talked to him about this. READMISSION 05/26/2018 This is a now 70 year old man we cared for with traumatic wounds on his left anterior lower extremity. He had been previously revascularized during that admission by Dr. [name]. Apparently in follow-up Dr. [name] noted that he had deterioration in his arterial status. He underwent a stent placement in the distal left SFA on 04/22/2018. 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 He went back to the angiography suite on 04/30/2018 he underwent PTA and balloon angioplasty of the occluded left mid anterior tibial artery, thrombotic occlusion went from 100 to 0% which reconstitutes the posterior tibial artery. He had thrombectomy and aspiration of the peroneal artery. The stent placed in the distal SFA left SFA was still occluded. 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 He has been recuperating at [facility] in [facility] after the most recent hospitalization. He is going home tomorrow. He tells me that 2 weeks ago he traumatized the tip of his left fifth toe. He came in urgently for our review of this. This was a history of before I noted that Dr. [name] had already noted dry gangrenous changes of the left fifth toe 06/09/2018; 2-week follow-up. I did contact Dr. [name] after his last appointment and he apparently saw 1 of Dr. Irven Shelling colleagues the next day.  He does not follow-up with Dr. [name] himself until Thursday of this week. 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 Nevertheless there is no evidence of infection no drainage and no pain. He had a new area that this week when we were signing him in today on the left anterior mid tibia area, this  is in close proximity to the previous wound we have dealt with in this clinic. 06/23/2018; 2-week follow-up. I did not receive a recent note from Dr. [name] to review today. Our office is trying to obtain this. He is apparently not planning to do further vascular interventions and wondered about compression to try and help with the patient's chronic venous insufficiency. However we are also concerned about the arterial flow. He arrives in clinic today with a new area on the left third toe. The areas on the calf/anterior tibia are close to closing. The left fifth toe is still mummified using Betadine. -In reviewing things with the patient he has what sounds like claudication with mild to moderate amount of activity. 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 I prescribed Levaquin over the phone while we attempted to arrange a plan of care. However the patient called yesterday to report he had low-grade fever and he came in today acutely. There is been a marked deterioration in the left third toe with spreading cellulitis up into the dorsal left foot. He was referred to the emergency room. Readmission: 06/29/2020 patient presents today for reevaluation here in our clinic he was previously treated by Dr. Dellia Nims at the latter part of 2019 in 2 the beginning of 2020. Subsequently we have not seen him since that time in the interim he did have evaluation with vein and vascular specialist specifically Dr. Anice Paganini who did perform quite extensive work for a left femoral to anterior tibial artery bypass. With that being said in the interim the patient has developed significant lymphedema and has wounds that he tells me have really never healed in regard to the incision site on the left leg. He also has multiple wounds on the feet for various reasons some of which is that he tends to pick at his feet. Fortunately there is no signs of active infection systemically at this time  he does have some wounds that are little bit deeper but most are fairly superficial he seems to have good blood flow and overall everything appears to be healthy I see no bone exposed and no obvious signs of osteomyelitis. I do not know that he necessarily needs a x-ray at this point although that something we could consider depending on how things progress. The patient does have a history of lymphedema, diabetes, this is type II, chronic kidney disease stage III, hypertension, and history of peripheral vascular disease. 07/05/2020; patient admitted last week. Is a patient I remember from 2019 he had a spreading infection involving the left foot and we sent him to the hospital. 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 He subsequently had a left femoral to anterior tibial bypass by Dr.Cain vein and vascular. He also has severe lymphedema with chronic skin changes related to that on the left leg. The most problematic area that was new today was on the left medial great toe. This was apparently a small area last week there was purulent drainage which our intake nurse cultured. Also areas on the left medial foot  and heel left lateral foot. He has 2 areas on the left medial calf left lateral calf in the setting of the severe lymphedema. 07/13/2020 on evaluation today patient appears to be doing better in my opinion compared to his last visit. The good news is there is no signs of active infection systemically and locally I do not see any signs of infection either. He did have an x-ray which was negative that is great news he had a culture which showed MRSA but at the same time he is been on the doxycycline which has helped. I do think we may want to extend this for 7 additional days 1/25; patient admitted to the clinic a few weeks ago. 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 We have been putting him under compression his  edema control is a lot better but he is severe verricused skin on the left leg. He is really done quite well he still has an open area on the left medial calf and the left medial first metatarsal head. We have been using silver collagen on the leg silver alginate on the foot 07/27/2020 upon evaluation today patient appears to be doing decently well in regard to his wounds. He still has a lot of dry skin on the left leg. Some of this is starting to peel back and I think he may be able to have them out by removing some that today. Fortunately there is no signs of active infection at this time on the left leg although on the right leg he does appear to have swelling and erythema as well as some mild warmth to touch. This does have been concerned about the possibility of cellulitis although within the differential diagnosis I do think that potentially a DVT has to be at least considered. We need to rule that out before proceeding would just call in the cellulitis. Especially since he is having pain in the posterior aspect of his calf muscle. 2/8; the patient had seen sparingly. He has severe skin changes of chronic lymphedema in the left leg thickened hyperkeratotic verrucous skin. He has an open wound on the medial part of the left first met head left mid tibia. He also has a rim of nonepithelialized skin in the anterior mid tibia. He brought in the AmLactin lotion that was been prescribed although I am not sure under compression and its utility. There concern about cellulitis on the right lower leg the last time he was here. He was put on on antibiotics. His DVT rule out was negative. The right leg looks fine he is using his stocking on this area 08/10/2020 upon evaluation today patient appears to be doing well with regard to his leg currently. He has been tolerating the dressing changes without complication. Fortunately there is no signs of active infection which is great news. Overall very pleased with  where things stand. 2/22; the patient still has an area on the medial part of the left first met his head. This looks better than when I last saw this earlier this month he has a rim of epithelialization but still some surface debris. Mostly everything on the left leg is healed. There is still a vulnerable in the left mid tibia area. 08/30/2020 upon evaluation today patient appears to be doing much better in regard to his wounds on his foot. Fortunately there does not appear to be any signs of active infection systemically though locally we did culture this last week  and it does appear that he does have MRSA currently. Nonetheless I think we will address that today I Minna send in a prescription for him in that regard. Overall though there does not appear to be any signs of significant worsening. 09/07/2020 on evaluation today patient's wounds over his left foot appear to be doing excellent. I do not see any signs of infection there is some callus buildup this can require debridement for certain but overall I feel like he is managing quite nicely. He still using the AmLactin cream which has been beneficial for him as well. 3/22; left foot wound is closed. There is no open area here. He is using ammonium lactate lotion to the lower extremities to help exfoliate dry cracked skin. He has compression stockings from [facility] in [place]. The wound on the medial part of his left first met head is healed today. READMISSION 04/12/2021 [name] is a patient we know fairly well he had a prolonged stay in clinic in 2019 with wounds on his left lateral and left anterior lower extremity in the setting of chronic venous insufficiency. More recently he was here earlier this year with predominantly an area on his left foot first metatarsal head plantar and he says the plantar foot broke down on its not long after we discharged him but he did not come back here. The last few months areas of broken down on his  left anterior and again the left lateral lower extremity. The leg itself is very swollen chronically enlarged a lot of hyperkeratotic dry Berry Q skin in the left lower leg. His edema extends well into the thigh. He was seen by Dr. Donzetta Matters. He had ABIs on 03/02/2021 showing an ABI on the right of 1 with a TBI of 0.72 his ABI in the left at 1.09 TBI of 0.99. Monophasic and biphasic waveforms on the right. On the left monophasic waveforms were noted he went on to have an angiogram on 03/27/2021 this showed the aortic aortic and iliac segments were free of flow-limiting stenosis the left common femoral vein to evaluate the left femoral to anterior tibial artery bypass was unobstructed the bypass was patent without any areas of stenosis. We discharged the patient in bilateral juxta lite stockings but very clearly that was not sufficient to control the swelling and maintain skin integrity. He is clearly going to need compression pumps. 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 This is fortunate because he is on his feet for long periods of time. Electronic Signature(s) Signed: 04/12/2021 4:24:04 PM By: Linton Ham MD Entered By: Linton Ham on 04/12/2021 14:05:00 -------------------------------------------------------------------------------- Physical Exam Details Patient Name: Date of Service: [name]. 04/12/2021 1:15 PM Medical Record Number: [id number] Patient Account Number: [id number] Date of Birth/Sex: Treating RN: 1950-07-17 (70 y.o. [name] Primary Care Provider: [name] Other Clinician: Referring Provider: Treating Provider/Extender: Stormy Card in Treatment: 0 Constitutional Sitting or standing Blood Pressure is within target range for patient.. Pulse regular and within target range for patient.Marland Kitchen Respirations regular, non-labored and within target range.. Temperature is normal and  within the target range for the patient.Marland Kitchen Appears in no distress. Cardiovascular Pedal pulses absent bilaterally.. Markedly edematous left leg extending to above the knee which is larger than on the right side. This is nonpitting. Extensive skin damage in the left leg.. Notes Wound exam; the patient has 4 open wounds.  This includes a large area on the problematic left lateral which is in the same situation as previously. 2 areas on the left anterior which are not as large. He has a large area over the plantar first metatarsal head. Electronic Signature(s) Signed: 04/12/2021 4:24:04 PM By: Linton Ham MD Entered By: Linton Ham on 04/12/2021 14:07:40 -------------------------------------------------------------------------------- Physician Orders Details Patient Name: Date of Service: [name]. 04/12/2021 1:15 PM Medical Record Number: [id number] Patient Account Number: [id number] Date of Birth/Sex: Treating RN: 1951-02-23 (70 y.o. [name] Primary Care Provider: [name] Other Clinician: Referring Provider: Treating Provider/Extender: Stormy Card in Treatment: 0 Verbal / Phone Orders: No Diagnosis Coding Follow-up Appointments ppointment in 1 week. - Dr. Dellia Nims Return A Bathing/ Shower/ Hygiene May shower with protection but do not get wound dressing(s) wet. - Use a cast protector so you can shower without getting your wrap(s) wet Edema Control - Lymphedema / SCD / Other Elevate legs to the level of the heart or above for 30 minutes daily and/or when sitting, a frequency of: Avoid standing for long periods of time. Patient to wear own compression stockings every day. - on right leg; UNNABOOT TO LLE!!!! Moisturize legs daily. - Ammonium LACTATE to BLE every day. Wound Treatment Wound #18 - Metatarsal head first Wound Laterality: Plantar, Left Cleanser: Normal Saline 1 x Per Week/7 Days Discharge Instructions: Cleanse the wound  with Normal Saline prior to applying a clean dressing using gauze sponges, not tissue or cotton balls. Cleanser: Soap and Water 1 x Per Week/7 Days Discharge Instructions: May shower and wash wound with dial antibacterial soap and water prior to dressing change. Cleanser: Wound Cleanser 1 x Per Week/7 Days Discharge Instructions: Cleanse the wound with wound cleanser prior to applying a clean dressing using gauze sponges, not tissue or cotton balls. Peri-Wound Care: Triamcinolone 15 (g) 1 x Per Week/7 Days Discharge Instructions: Use triamcinolone 15 (g) as directed Peri-Wound Care: Zinc Oxide Ointment 30g tube 1 x Per Week/7 Days Discharge Instructions: Apply Zinc Oxide to periwound with each dressing change Peri-Wound Care: Sween Lotion (Moisturizing lotion) 1 x Per Week/7 Days Discharge Instructions: Apply moisturizing lotion as directed Prim Dressing: KerraCel Ag Gelling Fiber Dressing, 4x5 in (silver alginate) 1 x Per Week/7 Days ary Discharge Instructions: Apply silver alginate to wound bed as instructed Secondary Dressing: Woven Gauze Sponge, Non-Sterile 4x4 in 1 x Per Week/7 Days Discharge Instructions: Apply over primary dressing as directed. Secondary Dressing: ABD Pad, 5x9 1 x Per Week/7 Days Discharge Instructions: Apply over primary dressing as directed. Compression Wrap: Unnaboot w/Calamine, 4x10 (in/yd) 1 x Per Week/7 Days Discharge Instructions: Apply Unnaboot as directed. May also use Miliken CoFlex Calamine 2 layer compression system as alternative. Wound #19 - Lower Leg Wound Laterality: Left, Anterior Cleanser: Normal Saline 1 x Per Week/7 Days Discharge Instructions: Cleanse the wound with Normal Saline prior to applying a clean dressing using gauze sponges, not tissue or cotton balls. Cleanser: Soap and Water 1 x Per Week/7 Days Discharge Instructions: May shower and wash wound with dial antibacterial soap and water prior to dressing change. Cleanser: Wound Cleanser 1 x  Per Week/7 Days Discharge Instructions: Cleanse the wound with wound cleanser prior to applying a clean dressing using gauze sponges, not tissue or cotton balls. Peri-Wound Care: Triamcinolone 15 (g) 1 x Per Week/7 Days Discharge Instructions: Use triamcinolone 15 (g) as directed Peri-Wound Care: Zinc Oxide Ointment 30g tube 1 x Per Week/7 Days Discharge Instructions: Apply Zinc  Oxide to periwound with each dressing change Peri-Wound Care: Sween Lotion (Moisturizing lotion) 1 x Per Week/7 Days Discharge Instructions: Apply moisturizing lotion as directed Prim Dressing: KerraCel Ag Gelling Fiber Dressing, 4x5 in (silver alginate) 1 x Per Week/7 Days ary Discharge Instructions: Apply silver alginate to wound bed as instructed Secondary Dressing: Woven Gauze Sponge, Non-Sterile 4x4 in 1 x Per Week/7 Days Discharge Instructions: Apply over primary dressing as directed. Secondary Dressing: ABD Pad, 5x9 1 x Per Week/7 Days Discharge Instructions: Apply over primary dressing as directed. Compression Wrap: Unnaboot w/Calamine, 4x10 (in/yd) 1 x Per Week/7 Days Discharge Instructions: Apply Unnaboot as directed. May also use Miliken CoFlex Calamine 2 layer compression system as alternative. Wound #20 - Lower Leg Wound Laterality: Left, Anterior, Distal Cleanser: Normal Saline 1 x Per Week/7 Days Discharge Instructions: Cleanse the wound with Normal Saline prior to applying a clean dressing using gauze sponges, not tissue or cotton balls. Cleanser: Soap and Water 1 x Per Week/7 Days Discharge Instructions: May shower and wash wound with dial antibacterial soap and water prior to dressing change. Cleanser: Wound Cleanser 1 x Per Week/7 Days Discharge Instructions: Cleanse the wound with wound cleanser prior to applying a clean dressing using gauze sponges, not tissue or cotton balls. Peri-Wound Care: Triamcinolone 15 (g) 1 x Per Week/7 Days Discharge Instructions: Use triamcinolone 15 (g) as  directed Peri-Wound Care: Zinc Oxide Ointment 30g tube 1 x Per Week/7 Days Discharge Instructions: Apply Zinc Oxide to periwound with each dressing change Peri-Wound Care: Sween Lotion (Moisturizing lotion) 1 x Per Week/7 Days Discharge Instructions: Apply moisturizing lotion as directed Prim Dressing: KerraCel Ag Gelling Fiber Dressing, 4x5 in (silver alginate) 1 x Per Week/7 Days ary Discharge Instructions: Apply silver alginate to wound bed as instructed Secondary Dressing: Woven Gauze Sponge, Non-Sterile 4x4 in 1 x Per Week/7 Days Discharge Instructions: Apply over primary dressing as directed. Secondary Dressing: ABD Pad, 5x9 1 x Per Week/7 Days Discharge Instructions: Apply over primary dressing as directed. Compression Wrap: Unnaboot w/Calamine, 4x10 (in/yd) 1 x Per Week/7 Days Discharge Instructions: Apply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

## 2021-04-18 NOTE — Progress Notes (Signed)
[name], [name] ([id number]) Visit Report for 04/12/2021 Abuse/Suicide Risk Screen Details Patient Name: Date of Service: [name], [name] 04/12/2021 1:15 PM Medical Record Number: [id number] Patient Account Number: [id number] Date of Birth/Sex: Treating RN: 02/26/1951 (70 y.o. [name], [name] Primary Care [name]: [name] Other Clinician: Referring [name]: Treating [name]/Extender: [name] in Treatment: 0 Abuse/Suicide Risk Screen Items Answer ABUSE RISK SCREEN: Has anyone close to you tried to hurt or harm you recentlyo No Do you feel uncomfortable with anyone in your familyo No Has anyone forced you do things that you didnt want to doo No Electronic Signature(s) Signed: 04/18/2021 5:23:14 PM By: [name] Hammock RN Entered By: [name] on 04/12/2021 13:07:21 -------------------------------------------------------------------------------- Activities of Daily Living Details Patient Name: Date of Service: [name], [name] 04/12/2021 1:15 PM Medical Record Number: [id number] Patient Account Number: [id number] Date of Birth/Sex: Treating RN: [birth year] (70 y.o. [name], [name] Primary Care Savon Cobbs: [name] Other Clinician: Referring Kryslyn Helbig: Treating Rankin Coolman/Extender: [name] in Treatment: 0 Activities of Daily Living Items Answer Activities of Daily Living (Please select one for each item) Drive Automobile Completely Able T Medications ake Completely Able Use T elephone Completely Able Care for Appearance Completely Able Use T oilet Completely Able Bath / Shower Completely Able Dress Self Completely Able Feed Self Completely Able Walk Completely Able Get In / Out Bed Completely Able Housework Completely Able Prepare Meals Completely [place] Completely Able Shop for Self Completely Able Electronic Signature(s) Signed: 04/18/2021 5:23:14 PM By: [name] Hammock  RN Entered By: [name] on 04/12/2021 13:07:35 -------------------------------------------------------------------------------- Education Screening Details Patient Name: Date of Service: [name]. 04/12/2021 1:15 PM Medical Record Number: [id number] Patient Account Number: [id number] Date of Birth/Sex: Treating RN: July 15, 1950 (70 y.o. [name], [name] Primary Care Zamyah Wiesman: [name] Other Clinician: Referring Brianny Soulliere: Treating [name]/Extender: [name] in Treatment: 0 Primary Learner Assessed: Patient Learning Preferences/Education Level/Primary Language Learning Preference: Explanation, Demonstration, Communication Board, Printed Material Highest Education Level: College or Above Preferred Language: English Cognitive Barrier Language Barrier: No Translator Needed: No Memory Deficit: No Emotional Barrier: No Cultural/Religious Beliefs Affecting Medical Care: No Physical Barrier Impaired Vision: Yes Glasses Impaired Hearing: No Decreased Hand dexterity: No Knowledge/Comprehension Knowledge Level: High Comprehension Level: High Ability to understand written instructions: High Ability to understand verbal instructions: High Motivation Anxiety Level: Calm Cooperation: Cooperative Education Importance: Denies Need Interest in Health Problems: Asks Questions Perception: Coherent Willingness to Engage in Self-Management High Activities: Readiness to Engage in Self-Management High Activities: Electronic Signature(s) Signed: 04/18/2021 5:23:14 PM By: [name] Hammock RN Entered By: [name] on 04/12/2021 13:08:00 -------------------------------------------------------------------------------- Fall Risk Assessment Details Patient Name: Date of Service: [name]. 04/12/2021 1:15 PM Medical Record Number: [id number] Patient Account Number: [id number] Date of Birth/Sex: Treating RN: 04-18-1951 (70 y.o. [name], [name] Primary Care Jered Heiny: [name] Other Clinician: Referring Takeru Bose: Treating Arryana Tolleson/Extender: [name] in Treatment: 0 Fall Risk Assessment Items Have you had 2 or more falls in the last 12 monthso 0 No Have you had any fall that resulted in injury in the last 12 monthso 0 No FALLS RISK SCREEN History of falling - immediate or within 3 months 0 No Secondary diagnosis (Do you have 2 or more medical diagnoseso) 0 No Ambulatory aid None/bed rest/wheelchair/nurse 0 No Crutches/cane/walker 0 No Furniture 0 No Intravenous therapy Access/Saline/Heparin Lock 0 No Gait/Transferring Normal/ bed rest/ wheelchair 0 No Weak (short steps  with or without shuffle, stooped but able to lift head while walking, may seek 0 No support from furniture) Impaired (short steps with shuffle, may have difficulty arising from chair, head down, impaired 0 No balance) Mental Status Oriented to own ability 0 No Electronic Signature(s) Signed: 04/18/2021 5:23:14 PM By: [name] Hammock RN Entered By: [name] on 04/12/2021 13:08:06 -------------------------------------------------------------------------------- Foot Assessment Details Patient Name: Date of Service: [name]. 04/12/2021 1:15 PM Medical Record Number: [id number] Patient Account Number: [id number] Date of Birth/Sex: Treating RN: 17-Jun-1951 (70 y.o. [name], [name] Primary Care Nathalie Cavendish: [name] Other Clinician: Referring Tawonna Esquer: Treating Annelie Boak/Extender: [name] in Treatment: 0 Foot Assessment Items Site Locations + = Sensation present, - = Sensation absent, C = Callus, U = Ulcer R = Redness, W = Warmth, M = Maceration, PU = Pre-ulcerative lesion F = Fissure, S = Swelling, D = Dryness Assessment Right: Left: Other Deformity: No No Prior Foot Ulcer: No Yes Prior Amputation: No Yes Charcot Joint: No No Ambulatory Status:  Ambulatory Without Help Gait: Steady Electronic Signature(s) Signed: 04/18/2021 5:23:14 PM By: [name] Hammock RN Entered By: [name] on 04/12/2021 13:09:30 -------------------------------------------------------------------------------- Nutrition Risk Screening Details Patient Name: Date of Service: [name], [name] 04/12/2021 1:15 PM Medical Record Number: [id number] Patient Account Number: [id number] Date of Birth/Sex: Treating RN: 02/20/1951 (70 y.o. [name], [name] Primary Care Chaz Mcglasson: [name] Other Clinician: Referring Mcarthur Ivins: Treating Nemesio Castrillon/Extender: [name] in Treatment: 0 Height (in): 74 Weight (lbs): 238 Body Mass Index (BMI): 30.6 Nutrition Risk Screening Items Score Screening NUTRITION RISK SCREEN: I have an illness or condition that made me change the kind and/or amount of food I eat 0 No I eat fewer than two meals per day 0 No I eat few fruits and vegetables, or milk products 0 No I have three or more drinks of beer, liquor or wine almost every day 0 No I have tooth or mouth problems that make it hard for me to eat 0 No I don't always have enough money to buy the food I need 0 No I eat alone most of the time 0 No I take three or more different prescribed or over-the-counter drugs a day 0 No Without wanting to, I have lost or gained 10 pounds in the last six months 0 No I am not always physically able to shop, cook and/or feed myself 0 No Nutrition Protocols Good Risk Protocol 0 No interventions needed Moderate Risk Protocol High Risk Proctocol Risk Level: Good Risk Score: 0 Electronic Signature(s) Signed: 04/18/2021 5:23:14 PM By: [name] Hammock RN Entered By: [name] on 04/12/2021 13:08:13

## 2021-04-18 NOTE — Progress Notes (Signed)
[name] ([id number]) Visit Report for 04/12/2021 Allergy List Details Patient Name: Date of Service: [name], [name] 04/12/2021 1:15 PM Medical Record Number: [id number] Patient Account Number: [id number] Date of Birth/Sex: Treating RN: Mar 30, 1951 (70 y.o. [name], [name] Primary Care Meryle Pugmire: [name] Other Clinician: Referring Emmalin Jaquess: Treating Carlson Belland/Extender: Stormy Card in Treatment: 0 Allergies Active Allergies No Known Drug Allergies Allergy Notes Electronic Signature(s) Signed: 04/18/2021 5:23:14 PM By: Rhae Hammock RN Entered By: Rhae Hammock on 04/12/2021 13:10:08 -------------------------------------------------------------------------------- Arrival Information Details Patient Name: Date of Service: [name]. 04/12/2021 1:15 PM Medical Record Number: [id number] Patient Account Number: [id number] Date of Birth/Sex: Treating RN: 10-11-1950 (70 y.o. [name], [name] Primary Care Masaru Chamberlin: [name] Other Clinician: Referring Zareth Rippetoe: Treating Venetta Knee/Extender: Stormy Card in Treatment: 0 Visit Information Patient Arrived: Ambulatory Arrival Time: 12:57 Accompanied By: self Transfer Assistance: None Patient Identification Verified: Yes Secondary Verification Process Completed: Yes Patient Requires Transmission-Based Precautions: No Patient Has Alerts: Yes Patient Alerts: ABI's: 09/22 L:1.09 R: 1. TBI's: R:0.72 L:0.99 History Since Last Visit Added or deleted any medications: No Any new allergies or adverse reactions: No Had a fall or experienced change in activities of daily living that may affect risk of falls: No Signs or symptoms of abuse/neglect since last visito No Hospitalized since last visit: No Implantable device outside of the clinic excluding cellular tissue based products placed in the center since last visit: No Has Dressing in Place as Prescribed: Yes Pain  Present Now: Yes Electronic Signature(s) Signed: 04/18/2021 5:23:14 PM By: Rhae Hammock RN Entered By: Rhae Hammock on 04/12/2021 13:16:10 -------------------------------------------------------------------------------- Clinic Level of Care Assessment Details Patient Name: Date of Service: [name] 04/12/2021 1:15 PM Medical Record Number: [id number] Patient Account Number: [id number] Date of Birth/Sex: Treating RN: [birth year] (70 y.o. [name], [place] Primary Care Sumeya Yontz: [name] Other Clinician: Referring Sahmya Arai: Treating Debby Clyne/Extender: Stormy Card in Treatment: 0 Clinic Level of Care Assessment Items TOOL 3 Quantity Score X- 1 0 Use when EandM and Procedure is performed on FOLLOW-UP visit ASSESSMENTS - Nursing Assessment / Reassessment X- 1 10 Reassessment of Co-morbidities (includes updates in patient status) X- 1 5 Reassessment of Adherence to Treatment Plan ASSESSMENTS - Wound and Skin Assessment / Reassessment []  - Points for Wound Assessment can only be taken for a new wound of unknown or different etiology and a procedure is 0 NOT performed to that wound []  - 0 Simple Wound Assessment / Reassessment - one wound X- 4 5 Complex Wound Assessment / Reassessment - multiple wounds []  - 0 Dermatologic / Skin Assessment (not related to wound area) ASSESSMENTS - Focused Assessment X- 1 5 Circumferential Edema Measurements - multi extremities []  - 0 Nutritional Assessment / Counseling / Intervention []  - 0 Lower Extremity Assessment (monofilament, tuning fork, pulses) []  - 0 Peripheral Arterial Disease Assessment (using hand held doppler) ASSESSMENTS - Ostomy and/or Continence Assessment and Care []  - 0 Incontinence Assessment and Management []  - 0 Ostomy Care Assessment and Management (repouching, etc.) PROCESS - Coordination of Care []  - Points for Discharge Coordination can only be taken for a new wound of  unknown or different etiology and a procedure 0 is NOT performed to that wound []  - 0 Simple Patient / Family Education for ongoing care X- 1 20 Complex (extensive) Patient / Family Education for ongoing care X- 1 10 Staff obtains Consents, Records, T Results / Process Orders est []  - 0 Staff  telephones HHA, Nursing Homes / Clarify orders / etc []  - 0 Routine Transfer to another Facility (non-emergent condition) []  - 0 Routine Hospital Admission (non-emergent condition) X- 1 15 New Admissions / Biomedical engineer / Ordering NPWT Apligraf, etc. , []  - 0 Emergency Hospital Admission (emergent condition) []  - 0 Simple Discharge Coordination X- 1 15 Complex (extensive) Discharge Coordination PROCESS - Special Needs []  - 0 Pediatric / Minor Patient Management []  - 0 Isolation Patient Management []  - 0 Hearing / Language / Visual special needs []  - 0 Assessment of Community assistance (transportation, D/C planning, etc.) []  - 0 Additional assistance / Altered mentation []  - 0 Support Surface(s) Assessment (bed, cushion, seat, etc.) INTERVENTIONS - Wound Cleansing / Measurement []  - Points for Wound Cleaning / Measurement, Wound Dressing, Specimen Collection and Specimen taken to lab can only 0 be taken for a new wound of unknown or different etiology and a procedure is NOT performed to that wound []  - 0 Simple Wound Cleansing - one wound X- 4 5 Complex Wound Cleansing - multiple wounds X- 1 5 Wound Imaging (photographs - any number of wounds) []  - 0 Wound Tracing (instead of photographs) []  - 0 Simple Wound Measurement - one wound X- 4 5 Complex Wound Measurement - multiple wounds INTERVENTIONS - Wound Dressings []  - 0 Small Wound Dressing one or multiple wounds []  - 0 Medium Wound Dressing one or multiple wounds X- 4 20 Large Wound Dressing one or multiple wounds INTERVENTIONS - Miscellaneous []  - 0 External ear exam []  - 0 Specimen Collection  (cultures, biopsies, blood, body fluids, etc.) []  - 0 Specimen(s) / Culture(s) sent or taken to Lab for analysis []  - 0 Patient Transfer (multiple staff / Civil Service fast streamer / Similar devices) []  - 0 Simple Staple / Suture removal (25 or less) []  - 0 Complex Staple / Suture removal (26 or more) []  - 0 Hypo / Hyperglycemic Management (close monitor of Blood Glucose) []  - 0 Ankle / Brachial Index (ABI) - do not check if billed separately X- 1 5 Vital Signs Has the patient been seen at the hospital within the last three years: Yes Total Score: 230 Level Of Care: New/Established - Level 5 Electronic Signature(s) Signed: 04/18/2021 5:23:14 PM By: Rhae Hammock RN Entered By: Rhae Hammock on 04/12/2021 16:30:12 -------------------------------------------------------------------------------- Compression Therapy Details Patient Name: Date of Service: [name]. 04/12/2021 1:15 PM Medical Record Number: [id number] Patient Account Number: [id number] Date of Birth/Sex: Treating RN: [birth year] (70 y.o. [name] Primary Care Shaan Rhoads: [name] Other Clinician: Referring Dhairya Corales: Treating Allyne Hebert/Extender: Stormy Card in Treatment: 0 Compression Therapy Performed for Wound Assessment: Wound #18 Left,Plantar Metatarsal head first Performed By: Clinician Rhae Hammock, RN Compression Type: Rolena Infante Post Procedure Diagnosis Same as Pre-procedure Electronic Signature(s) Signed: 04/18/2021 5:23:14 PM By: Rhae Hammock RN Entered By: Rhae Hammock on 04/12/2021 13:48:19 -------------------------------------------------------------------------------- Compression Therapy Details Patient Name: Date of Service: [name]. 04/12/2021 1:15 PM Medical Record Number: [id number] Patient Account Number: [id number] Date of Birth/Sex: Treating RN: 30-Jun-1950 (70 y.o. [name] Primary Care Baillie Mohammad: [name] Other  Clinician: Referring Ottis Vacha: Treating Tabria Steines/Extender: Stormy Card in Treatment: 0 Compression Therapy Performed for Wound Assessment: Wound #19 Left,Anterior Lower Leg Performed By: Clinician Rhae Hammock, RN Compression Type: Rolena Infante Post Procedure Diagnosis Same as Pre-procedure Electronic Signature(s) Signed: 04/18/2021 5:23:14 PM By: Rhae Hammock RN Entered By: Rhae Hammock on 04/12/2021 13:48:19 -------------------------------------------------------------------------------- Compression Therapy Details Patient Name:  Date of Service: JAPHET, MORGENTHALER 04/12/2021 1:15 PM Medical Record Number: [id number] Patient Account Number: [id number] Date of Birth/Sex: Treating RN: 1950-12-18 (70 y.o. [name], [name] Primary Care Lateef Juncaj: [name] Other Clinician: Referring Jami Ohlin: Treating Gust Eugene/Extender: Stormy Card in Treatment: 0 Compression Therapy Performed for Wound Assessment: Wound #20 Left,Distal,Anterior Lower Leg Performed By: Clinician Rhae Hammock, RN Compression Type: Rolena Infante Post Procedure Diagnosis Same as Pre-procedure Electronic Signature(s) Signed: 04/18/2021 5:23:14 PM By: Rhae Hammock RN Entered By: Rhae Hammock on 04/12/2021 13:48:19 -------------------------------------------------------------------------------- Compression Therapy Details Patient Name: Date of Service: [name]. 04/12/2021 1:15 PM Medical Record Number: [id number] Patient Account Number: [id number] Date of Birth/Sex: Treating RN: May 21, 1951 (70 y.o. [name], [name] Primary Care Kariann Wecker: [name] Other Clinician: Referring Micaylah Bertucci: Treating Greene Diodato/Extender: Stormy Card in Treatment: 0 Compression Therapy Performed for Wound Assessment: Wound #21 Left,Medial Lower Leg Performed By: Clinician Rhae Hammock, RN Compression Type: Rolena Infante Post  Procedure Diagnosis Same as Pre-procedure Electronic Signature(s) Signed: 04/18/2021 5:23:14 PM By: Rhae Hammock RN Entered By: Rhae Hammock on 04/12/2021 13:48:19 -------------------------------------------------------------------------------- Encounter Discharge Information Details Patient Name: Date of Service: [name]. 04/12/2021 1:15 PM Medical Record Number: [id number] Patient Account Number: [id number] Date of Birth/Sex: Treating RN: 28-Apr-1951 (70 y.o. [name], [name] Primary Care Renbarger Keats: [name] Other Clinician: Referring Amberlie Gaillard: Treating Cybill Uriegas/Extender: Stormy Card in Treatment: 0 Encounter Discharge Information Items Discharge Condition: Stable Ambulatory Status: Ambulatory Discharge Destination: Home Transportation: Private Auto Accompanied ByJose Persia Schedule Follow-up Appointment: Yes Clinical Summary of Care: Patient Declined Electronic Signature(s) Signed: 04/18/2021 5:23:14 PM By: Rhae Hammock RN Entered By: Rhae Hammock on 04/12/2021 16:31:03 -------------------------------------------------------------------------------- Lower Extremity Assessment Details Patient Name: Date of Service: [name]. 04/12/2021 1:15 PM Medical Record Number: [id number] Patient Account Number: [id number] Date of Birth/Sex: Treating RN: 02/28/1951 (70 y.o. [name], [name] Primary Care Raheem Kolbe: [name] Other Clinician: Referring Jaston Havens: Treating Alyaan Budzynski/Extender: Stormy Card in Treatment: 0 Edema Assessment Assessed: [name]: Yes] [name]: No] Edema: [Left: Ye] [Right: s] Calf Left: Right: Point of Measurement: 41 cm From Medial Instep 50 cm Ankle Left: Right: Point of Measurement: 10 cm From Medial Instep 30 cm Vascular Assessment Pulses: Dorsalis Pedis Palpable: [Left:Yes] Posterior Tibial Palpable: [Left:Yes] Electronic Signature(s) Signed: 04/18/2021  5:23:14 PM By: Rhae Hammock RN Entered By: Rhae Hammock on 04/12/2021 13:22:04 -------------------------------------------------------------------------------- Multi Wound Chart Details Patient Name: Date of Service: [name]. 04/12/2021 1:15 PM Medical Record Number: [id number] Patient Account Number: [id number] Date of Birth/Sex: Treating RN: Feb 12, 1951 (70 y.o. [name] Primary Care Gladyes Kudo: [name] Other Clinician: Referring Vasiliy Mccarry: Treating Corben Auzenne/Extender: Stormy Card in Treatment: 0 Vital Signs Height(in): 66 Capillary Blood Glucose(mg/dl): 189 Weight(lbs): 238 Pulse(bpm): 54 Body Mass Index(BMI): 31 Blood Pressure(mmHg): 128/74 Temperature(F): 98.1 Respiratory Rate(breaths/min): 17 Photos: Left, Plantar Metatarsal head first Left, Anterior Lower Leg Left, Distal, Anterior Lower Leg Wound Location: Gradually Appeared Gradually Appeared Gradually Appeared Wounding Event: Diabetic Wound/Ulcer of the Lower Diabetic Wound/Ulcer of the Lower Diabetic Wound/Ulcer of the Lower Primary Etiology: Extremity Extremity Extremity Glaucoma, Sleep Apnea, Glaucoma, Sleep Apnea, Glaucoma, Sleep Apnea, Comorbid History: Hypertension, Peripheral Arterial Hypertension, Peripheral Arterial Hypertension, Peripheral Arterial Disease, Peripheral Venous Disease, Disease, Peripheral Venous Disease, Disease, Peripheral Venous Disease, Type II Diabetes, Gout, Osteoarthritis, Type II Diabetes, Gout, Osteoarthritis, Type II Diabetes, Gout, Osteoarthritis, Neuropathy Neuropathy Neuropathy 08/23/2020 08/23/2020 08/23/2020 Date Acquired: 0 0 0 Weeks of Treatment: Open  Open Open Wound Status: 4.5x4x0.2 4.6x4x0.3 3.3x2.4x0.1 Measurements L x W x D (cm) 14.137 14.451 6.22 A (cm) : rea 2.827 4.335 0.622 Volume (cm) : 0.00% 0.00% 0.00% % Reduction in A rea: 0.00% 0.00% 0.00% % Reduction in Volume: Grade 2 Grade 2 Grade  2 Classification: Large Large Large Exudate A mount: Serosanguineous Serosanguineous Serosanguineous Exudate Type: red, brown red, brown red, brown Exudate Color: Distinct, outline attached Distinct, outline attached Distinct, outline attached Wound Margin: Large (67-100%) Medium (34-66%) Large (67-100%) Granulation A mount: Red, Pink Red, Pink Red, Pink Granulation Quality: None Present (0%) Medium (34-66%) Small (1-33%) Necrotic A mount: Fat Layer (Subcutaneous Tissue): Yes Fat Layer (Subcutaneous Tissue): Yes Fat Layer (Subcutaneous Tissue): Yes Exposed Structures: Fascia: No Fascia: No Fascia: No Tendon: No Tendon: No Tendon: No Muscle: No Muscle: No Muscle: No Joint: No Joint: No Joint: No Bone: No Bone: No Bone: No None None None Epithelialization: Compression Therapy Compression Therapy Compression Therapy Procedures Performed: Wound Number: 21 N/A N/A Photos: N/A N/A Left, Medial Lower Leg N/A N/A Wound Location: Gradually Appeared N/A N/A Wounding Event: Diabetic Wound/Ulcer of the Lower N/A N/A Primary Etiology: Extremity Glaucoma, Sleep Apnea, N/A N/A Comorbid History: Hypertension, Peripheral Arterial Disease, Peripheral Venous Disease, Type II Diabetes, Gout, Osteoarthritis, Neuropathy 08/23/2020 N/A N/A Date Acquired: 0 N/A N/A Weeks of Treatment: Open N/A N/A Wound Status: 2.5x1.5x0.3 N/A N/A Measurements L x W x D (cm) 2.945 N/A N/A A (cm) : rea 0.884 N/A N/A Volume (cm) : 0.00% N/A N/A % Reduction in A rea: 0.00% N/A N/A % Reduction in Volume: Grade 2 N/A N/A Classification: Large N/A N/A Exudate A mount: Serosanguineous N/A N/A Exudate Type: red, brown N/A N/A Exudate Color: Distinct, outline attached N/A N/A Wound Margin: Large (67-100%) N/A N/A Granulation A mount: Red, Pink N/A N/A Granulation Quality: Small (1-33%) N/A N/A Necrotic A mount: Fat Layer (Subcutaneous Tissue): Yes N/A N/A Exposed  Structures: Fascia: No Tendon: No Muscle: No Joint: No Bone: No None N/A N/A Epithelialization: Compression Therapy N/A N/A Procedures Performed: Treatment Notes Electronic Signature(s) Signed: 04/12/2021 4:24:04 PM By: Linton Ham MD Signed: 04/12/2021 5:56:18 PM By: Levan Hurst RN, BSN Entered By: Linton Ham on 04/12/2021 13:54:12 -------------------------------------------------------------------------------- Multi-Disciplinary Care Plan Details Patient Name: Date of Service: [name]. 04/12/2021 1:15 PM Medical Record Number: [id number] Patient Account Number: [id number] Date of Birth/Sex: Treating RN: 1951/03/19 (70 y.o. [name], [name] Primary Care Athel Merriweather: [name] Other Clinician: Referring Haskel Dewalt: Treating Subhan Hoopes/Extender: Stormy Card in Treatment: 0 Active Inactive Orientation to the Wound Care Program Nursing Diagnoses: Knowledge deficit related to the wound healing center program Goals: Patient/caregiver will verbalize understanding of the [place] Date Initiated: 04/12/2021 Target Resolution Date: 04/28/2021 Goal Status: Active Interventions: Provide education on orientation to the wound center Notes: Wound/Skin Impairment Nursing Diagnoses: Impaired tissue integrity Knowledge deficit related to ulceration/compromised skin integrity Goals: Patient will have a decrease in wound volume by X% from date: (specify in notes) Date Initiated: 04/12/2021 Target Resolution Date: 04/23/2021 Goal Status: Active Patient/caregiver will verbalize understanding of skin care regimen Date Initiated: 04/12/2021 Target Resolution Date: 04/25/2021 Goal Status: Active Ulcer/skin breakdown will have a volume reduction of 30% by week 4 Date Initiated: 04/12/2021 Target Resolution Date: 04/27/2021 Goal Status: Active Interventions: Assess patient/caregiver ability to obtain necessary  supplies Assess patient/caregiver ability to perform ulcer/skin care regimen upon admission and as needed Assess ulceration(s) every visit Notes: Electronic Signature(s) Signed: 04/18/2021 5:23:14 PM By: Rhae Hammock  RN Entered By: Rhae Hammock on 04/12/2021 13:47:34 -------------------------------------------------------------------------------- Pain Assessment Details Patient Name: Date of Service: JANELLE, CULTON 04/12/2021 1:15 PM Medical Record Number: [id number] Patient Account Number: [id number] Date of Birth/Sex: Treating RN: 1950/08/14 (70 y.o. [name], [name] Primary Care Keldric Poyer: [name] Other Clinician: Referring Jamiria Langill: Treating Hondo Nanda/Extender: Stormy Card in Treatment: 0 Active Problems Location of Pain Severity and Description of Pain Patient Has Paino Yes Site Locations Pain Location: Pain Location: Generalized Pain, Pain in Ulcers With Dressing Change: Yes Duration of the Pain. Constant / Intermittento Constant Rate the pain. Current Pain Level: 4 Worst Pain Level: 10 Least Pain Level: 0 Tolerable Pain Level: 4 Character of Pain Describe the Pain: Aching Pain Management and Medication Current Pain Management: Medication: No Cold Application: No Rest: No Massage: No Activity: No T.E.N.S.: No Heat Application: No Leg drop or elevation: No Is the Current Pain Management Adequate: Adequate How does your wound impact your activities of daily livingo Sleep: No Bathing: No Appetite: No Relationship With Others: No Bladder Continence: No Emotions: No Bowel Continence: No Work: No Toileting: No Drive: No Dressing: No Hobbies: No Electronic Signature(s) Signed: 04/18/2021 5:23:14 PM By: Rhae Hammock RN Entered By: Rhae Hammock on 04/12/2021 13:02:54 -------------------------------------------------------------------------------- Patient/Caregiver Education Details Patient Name: Date  of Service: [name] 10/19/2022andnbsp1:15 PM Medical Record Number: [id number] Patient Account Number: [id number] Date of Birth/Gender: Treating RN: October 20, 1950 (70 y.o. [name] Primary Care Physician: [name] Other Clinician: Referring Physician: Treating Physician/Extender: Stormy Card in Treatment: 0 Education Assessment Education Provided To: Patient Education Topics Provided Welcome T [facility]: o Methods: Explain/Verbal Responses: Reinforcements needed, State content correctly Electronic Signature(s) Signed: 04/18/2021 5:23:14 PM By: Rhae Hammock RN Entered By: Rhae Hammock on 04/12/2021 13:47:45 -------------------------------------------------------------------------------- Wound Assessment Details Patient Name: Date of Service: [name]. 04/12/2021 1:15 PM Medical Record Number: [id number] Patient Account Number: [id number] Date of Birth/Sex: Treating RN: 09/12/1950 (69 y.o. [name], [place] Primary Care Tricia Oaxaca: [name] Other Clinician: Referring Paton Crum: Treating Ninah Moccio/Extender: Stormy Card in Treatment: 0 Wound Status Wound Number: 18 Primary Diabetic Wound/Ulcer of the Lower Extremity Etiology: Wound Location: Left, Plantar Metatarsal head first Wound Open Wounding Event: Gradually Appeared Status: Date Acquired: 08/23/2020 Comorbid Glaucoma, Sleep Apnea, Hypertension, Peripheral Arterial Disease, Weeks Of Treatment: 0 History: Peripheral Venous Disease, Type II Diabetes, Gout, Osteoarthritis, Clustered Wound: No Neuropathy Photos Wound Measurements Length: (cm) 4.5 Width: (cm) 4 Depth: (cm) 0.2 Area: (cm) 14.137 Volume: (cm) 2.827 % Reduction in Area: 0% % Reduction in Volume: 0% Epithelialization: None Tunneling: No Undermining: No Wound Description Classification: Grade 2 Wound Margin: Distinct, outline attached Exudate Amount:  Large Exudate Type: Serosanguineous Exudate Color: red, brown Foul Odor After Cleansing: No Slough/Fibrino No Wound Bed Granulation Amount: Large (67-100%) Exposed Structure Granulation Quality: Red, Pink Fascia Exposed: No Necrotic Amount: None Present (0%) Fat Layer (Subcutaneous Tissue) Exposed: Yes Tendon Exposed: No Muscle Exposed: No Joint Exposed: No Bone Exposed: No Treatment Notes Wound #18 (Metatarsal head first) Wound Laterality: Plantar, Left Cleanser Normal Saline Discharge Instruction: Cleanse the wound with Normal Saline prior to applying a clean dressing using gauze sponges, not tissue or cotton balls. Soap and Water Discharge Instruction: May shower and wash wound with dial antibacterial soap and water prior to dressing change. Wound Cleanser Discharge Instruction: Cleanse the wound with wound cleanser prior to applying a clean dressing using gauze sponges, not tissue or cotton balls. Peri-Wound  Care Triamcinolone 15 (g) Discharge Instruction: Use triamcinolone 15 (g) as directed Zinc Oxide Ointment 30g tube Discharge Instruction: Apply Zinc Oxide to periwound with each dressing change Sween Lotion (Moisturizing lotion) Discharge Instruction: Apply moisturizing lotion as directed Topical Primary Dressing KerraCel Ag Gelling Fiber Dressing, 4x5 in (silver alginate) Discharge Instruction: Apply silver alginate to wound bed as instructed Secondary Dressing Woven Gauze Sponge, Non-Sterile 4x4 in Discharge Instruction: Apply over primary dressing as directed. ABD Pad, 5x9 Discharge Instruction: Apply over primary dressing as directed. Secured With Compression Wrap Unnaboot w/Calamine, 4x10 (in/yd) Discharge Instruction: Apply Unnaboot as directed. May also use Miliken CoFlex Calamine 2 layer compression system as alternative. Compression Stockings Add-Ons Electronic Signature(s) Signed: 04/18/2021 5:23:14 PM By: Rhae Hammock RN Entered By: Rhae Hammock on 04/12/2021 13:31:16 -------------------------------------------------------------------------------- Wound Assessment Details Patient Name: Date of Service: [name]. 04/12/2021 1:15 PM Medical Record Number: [id number] Patient Account Number: [id number] Date of Birth/Sex: Treating RN: 12-24-1950 (70 y.o. [name], [name] Primary Care Rai Sinagra: [name] Other Clinician: Referring Ismail Graziani: Treating Jaleeyah Munce/Extender: Stormy Card in Treatment: 0 Wound Status Wound Number: 19 Primary Diabetic Wound/Ulcer of the Lower Extremity Etiology: Wound Location: Left, Anterior Lower Leg Wound Open Wounding Event: Gradually Appeared Status: Date Acquired: 08/23/2020 Comorbid Glaucoma, Sleep Apnea, Hypertension, Peripheral Arterial Disease, Weeks Of Treatment: 0 History: Peripheral Venous Disease, Type II Diabetes, Gout, Osteoarthritis, Clustered Wound: No Neuropathy Photos Wound Measurements Length: (cm) 4.6 Width: (cm) 4 Depth: (cm) 0.3 Area: (cm) 14.451 Volume: (cm) 4.335 % Reduction in Area: 0% % Reduction in Volume: 0% Epithelialization: None Tunneling: No Undermining: No Wound Description Classification: Grade 2 Wound Margin: Distinct, outline attached Exudate Amount: Large Exudate Type: Serosanguineous Exudate Color: red, brown Foul Odor After Cleansing: No Slough/Fibrino Yes Wound Bed Granulation Amount: Medium (34-66%) Exposed Structure Granulation Quality: Red, Pink Fascia Exposed: No Necrotic Amount: Medium (34-66%) Fat Layer (Subcutaneous Tissue) Exposed: Yes Necrotic Quality: Adherent Slough Tendon Exposed: No Muscle Exposed: No Joint Exposed: No Bone Exposed: No Treatment Notes Wound #19 (Lower Leg) Wound Laterality: Left, Anterior Cleanser Normal Saline Discharge Instruction: Cleanse the wound with Normal Saline prior to applying a clean dressing using gauze sponges, not tissue or cotton balls. Soap and  Water Discharge Instruction: May shower and wash wound with dial antibacterial soap and water prior to dressing change. Wound Cleanser Discharge Instruction: Cleanse the wound with wound cleanser prior to applying a clean dressing using gauze sponges, not tissue or cotton balls. Peri-Wound Care Triamcinolone 15 (g) Discharge Instruction: Use triamcinolone 15 (g) as directed Zinc Oxide Ointment 30g tube Discharge Instruction: Apply Zinc Oxide to periwound with each dressing change Sween Lotion (Moisturizing lotion) Discharge Instruction: Apply moisturizing lotion as directed Topical Primary Dressing KerraCel Ag Gelling Fiber Dressing, 4x5 in (silver alginate) Discharge Instruction: Apply silver alginate to wound bed as instructed Secondary Dressing Woven Gauze Sponge, Non-Sterile 4x4 in Discharge Instruction: Apply over primary dressing as directed. ABD Pad, 5x9 Discharge Instruction: Apply over primary dressing as directed. Secured With Compression Wrap Unnaboot w/Calamine, 4x10 (in/yd) Discharge Instruction: Apply Unnaboot as directed. May also use Miliken CoFlex Calamine 2 layer compression system as alternative. Compression Stockings Add-Ons Electronic Signature(s) Signed: 04/18/2021 5:23:14 PM By: Rhae Hammock RN Entered By: Rhae Hammock on 04/12/2021 13:31:58 -------------------------------------------------------------------------------- Wound Assessment Details Patient Name: Date of Service: [name]. 04/12/2021 1:15 PM Medical Record Number: [id number] Patient Account Number: [id number] Date of Birth/Sex: Treating RN: 11/12/1950 (70 y.o. [name] Primary Care Osten Janek: [name]  Other Clinician: Referring Maury Bamba: Treating Marqui Formby/Extender: Stormy Card in Treatment: 0 Wound Status Wound Number: 20 Primary Diabetic Wound/Ulcer of the Lower Extremity Etiology: Wound Location: Left, Distal, Anterior Lower  Leg Wound Open Wounding Event: Gradually Appeared Status: Date Acquired: 08/23/2020 Comorbid Glaucoma, Sleep Apnea, Hypertension, Peripheral Arterial Disease, Weeks Of Treatment: 0 History: Peripheral Venous Disease, Type II Diabetes, Gout, Osteoarthritis, Clustered Wound: No Neuropathy Photos Wound Measurements Length: (cm) 3.3 Width: (cm) 2.4 Depth: (cm) 0.1 Area: (cm) 6.22 Volume: (cm) 0.622 % Reduction in Area: 0% % Reduction in Volume: 0% Epithelialization: None Tunneling: No Undermining: No Wound Description Classification: Grade 2 Wound Margin: Distinct, outline attached Exudate Amount: Large Exudate Type: Serosanguineous Exudate Color: red, brown Foul Odor After Cleansing: No Slough/Fibrino Yes Wound Bed Granulation Amount: Large (67-100%) Exposed Structure Granulation Quality: Red, Pink Fascia Exposed: No Necrotic Amount: Small (1-33%) Fat Layer (Subcutaneous Tissue) Exposed: Yes Necrotic Quality: Adherent Slough Tendon Exposed: No Muscle Exposed: No Joint Exposed: No Bone Exposed: No Treatment Notes Wound #20 (Lower Leg) Wound Laterality: Left, Anterior, Distal Cleanser Normal Saline Discharge Instruction: Cleanse the wound with Normal Saline prior to applying a clean dressing using gauze sponges, not tissue or cotton balls. Soap and Water Discharge Instruction: May shower and wash wound with dial antibacterial soap and water prior to dressing change. Wound Cleanser Discharge Instruction: Cleanse the wound with wound cleanser prior to applying a clean dressing using gauze sponges, not tissue or cotton balls. Peri-Wound Care Triamcinolone 15 (g) Discharge Instruction: Use triamcinolone 15 (g) as directed Zinc Oxide Ointment 30g tube Discharge Instruction: Apply Zinc Oxide to periwound with each dressing change Sween Lotion (Moisturizing lotion) Discharge Instruction: Apply moisturizing lotion as directed Topical Primary Dressing KerraCel Ag Gelling  Fiber Dressing, 4x5 in (silver alginate) Discharge Instruction: Apply silver alginate to wound bed as instructed Secondary Dressing Woven Gauze Sponge, Non-Sterile 4x4 in Discharge Instruction: Apply over primary dressing as directed. ABD Pad, 5x9 Discharge Instruction: Apply over primary dressing as directed. Secured With Compression Wrap Unnaboot w/Calamine, 4x10 (in/yd) Discharge Instruction: Apply Unnaboot as directed. May also use Miliken CoFlex Calamine 2 layer compression system as alternative. Compression Stockings Add-Ons Electronic Signature(s) Signed: 04/18/2021 5:23:14 PM By: Rhae Hammock RN Entered By: Rhae Hammock on 04/12/2021 13:32:26 -------------------------------------------------------------------------------- Wound Assessment Details Patient Name: Date of Service: [name]. 04/12/2021 1:15 PM Medical Record Number: [id number] Patient Account Number: [id number] Date of Birth/Sex: Treating RN: [birth year] (70 y.o. [name], [name] Primary Care Jamael Hoffmann: [name] Other Clinician: Referring Antoninette Lerner: Treating Gracin Mcpartland/Extender: Stormy Card in Treatment: 0 Wound Status Wound Number: 21 Primary Diabetic Wound/Ulcer of the Lower Extremity Etiology: Wound Location: Left, Medial Lower Leg Wound Open Wounding Event: Gradually Appeared Status: Date Acquired: 08/23/2020 Comorbid Glaucoma, Sleep Apnea, Hypertension, Peripheral Arterial Disease, Weeks Of Treatment: 0 History: Peripheral Venous Disease, Type II Diabetes, Gout, Osteoarthritis, Clustered Wound: No Neuropathy Photos Wound Measurements Length: (cm) 2.5 Width: (cm) 1.5 Depth: (cm) 0.3 Area: (cm) 2.945 Volume: (cm) 0.884 % Reduction in Area: 0% % Reduction in Volume: 0% Epithelialization: None Tunneling: No Undermining: No Wound Description Classification: Grade 2 Wound Margin: Distinct, outline attached Exudate Amount: Large Exudate Type:  Serosanguineous Exudate Color: red, brown Foul Odor After Cleansing: No Slough/Fibrino Yes Wound Bed Granulation Amount: Large (67-100%) Exposed Structure Granulation Quality: Red, Pink Fascia Exposed: No Necrotic Amount: Small (1-33%) Fat Layer (Subcutaneous Tissue) Exposed: Yes Necrotic Quality: Adherent Slough Tendon Exposed: No Muscle Exposed: No Joint Exposed: No Bone Exposed: No Treatment  Notes Wound #21 (Lower Leg) Wound Laterality: Left, Medial Cleanser Normal Saline Discharge Instruction: Cleanse the wound with Normal Saline prior to applying a clean dressing using gauze sponges, not tissue or cotton balls. Soap and Water Discharge Instruction: May shower and wash wound with dial antibacterial soap and water prior to dressing change. Wound Cleanser Discharge Instruction: Cleanse the wound with wound cleanser prior to applying a clean dressing using gauze sponges, not tissue or cotton balls. Peri-Wound Care Triamcinolone 15 (g) Discharge Instruction: Use triamcinolone 15 (g) as directed Zinc Oxide Ointment 30g tube Discharge Instruction: Apply Zinc Oxide to periwound with each dressing change Sween Lotion (Moisturizing lotion) Discharge Instruction: Apply moisturizing lotion as directed Topical Primary Dressing KerraCel Ag Gelling Fiber Dressing, 4x5 in (silver alginate) Discharge Instruction: Apply silver alginate to wound bed as instructed Secondary Dressing Woven Gauze Sponge, Non-Sterile 4x4 in Discharge Instruction: Apply over primary dressing as directed. ABD Pad, 5x9 Discharge Instruction: Apply over primary dressing as directed. Secured With Compression Wrap Unnaboot w/Calamine, 4x10 (in/yd) Discharge Instruction: Apply Unnaboot as directed. May also use Miliken CoFlex Calamine 2 layer compression system as alternative. Compression Stockings Add-Ons Electronic Signature(s) Signed: 04/18/2021 5:23:14 PM By: Rhae Hammock RN Entered By: Rhae Hammock on 04/12/2021 13:32:52 -------------------------------------------------------------------------------- Vitals Details Patient Name: Date of Service: [name]. 04/12/2021 1:15 PM Medical Record Number: [id number] Patient Account Number: [id number] Date of Birth/Sex: Treating RN: 06-19-1951 (70 y.o. [name], [name] Primary Care [place]ica Franson: [name] Other Clinician: Referring Idelia Caudell: Treating Marguerette Sheller/Extender: Stormy Card in Treatment: 0 Vital Signs Time Taken: 13:04 Temperature (F): 98.1 Height (in): 74 Pulse (bpm): 68 Source: Stated Respiratory Rate (breaths/min): 17 Weight (lbs): 238 Blood Pressure (mmHg): 128/74 Source: Stated Capillary Blood Glucose (mg/dl): 189 Body Mass Index (BMI): 30.6 Reference Range: 80 - 120 mg / dl Electronic Signature(s) Signed: 04/18/2021 5:23:14 PM By: Rhae Hammock RN Entered By: Rhae Hammock on 04/12/2021 13:04:52

## 2021-04-20 ENCOUNTER — Encounter (HOSPITAL_BASED_OUTPATIENT_CLINIC_OR_DEPARTMENT_OTHER): Payer: BC Managed Care – PPO | Admitting: Internal Medicine

## 2021-04-20 ENCOUNTER — Other Ambulatory Visit: Payer: Self-pay

## 2021-04-20 DIAGNOSIS — E11621 Type 2 diabetes mellitus with foot ulcer: Secondary | ICD-10-CM | POA: Diagnosis not present

## 2021-04-24 ENCOUNTER — Other Ambulatory Visit: Payer: Self-pay

## 2021-04-24 ENCOUNTER — Encounter (HOSPITAL_BASED_OUTPATIENT_CLINIC_OR_DEPARTMENT_OTHER): Payer: BC Managed Care – PPO | Admitting: Internal Medicine

## 2021-04-24 DIAGNOSIS — E11621 Type 2 diabetes mellitus with foot ulcer: Secondary | ICD-10-CM | POA: Diagnosis not present

## 2021-04-25 LAB — AEROBIC/ANAEROBIC CULTURE W GRAM STAIN (SURGICAL/DEEP WOUND)

## 2021-04-25 NOTE — Progress Notes (Signed)
GAEL, LONDO ([id number]) Visit Report for 04/20/2021 Arrival Information Details Patient Name: Date of Service: IKEEM, CLECKLER 04/20/2021 8:45 A M Medical Record Number: [id number] Patient Account Number: [id number] Date of Birth/Sex: Treating RN: [birth year] (70 y.o. [name], [name] Primary Care Yared Susan: [name] Other Clinician: Referring Cartier Washko: Treating Heaton Sarin/Extender: Stormy Card in Treatment: 1 Visit Information History Since Last Visit Added or deleted any medications: No Patient Arrived: Ambulatory Any new allergies or adverse reactions: No Arrival Time: 09:09 Had a fall or experienced change in No Accompanied By: self activities of daily living that may affect Transfer Assistance: None risk of falls: Patient Identification Verified: Yes Signs or symptoms of abuse/neglect since last visito No Secondary Verification Process Completed: Yes Hospitalized since last visit: No Patient Requires Transmission-Based Precautions: No Implantable device outside of the clinic excluding No Patient Has Alerts: Yes cellular tissue based products placed in the center Patient Alerts: ABI's: 09/22 L:1.09 R: 1. since last visit: TBI's: R:0.72 L:0.99 Has Dressing in Place as Prescribed: Yes Has Compression in Place as Prescribed: Yes Pain Present Now: No Electronic Signature(s) Signed: 04/20/2021 5:43:20 PM By: Deon Pilling RN, BSN Entered By: Deon Pilling on 04/20/2021 09:16:58 -------------------------------------------------------------------------------- Compression Therapy Details Patient Name: Date of Service: [name] 04/20/2021 8:45 A M Medical Record Number: [id number] Patient Account Number: [id number] Date of Birth/Sex: Treating RN: 03-May-1951 (70 y.o. [name] Primary Care Kesha Hurrell: [name] Other Clinician: Referring Tameisha Covell: Treating Ares Tegtmeyer/Extender: Stormy Card in Treatment:  1 Compression Therapy Performed for Wound Assessment: Wound #18 Left,Plantar Metatarsal head first Performed By: Clinician Deon Pilling, RN Compression Type: Double Layer Post Procedure Diagnosis Same as Pre-procedure Electronic Signature(s) Signed: 04/20/2021 5:43:20 PM By: Deon Pilling RN, BSN Entered By: Deon Pilling on 04/20/2021 09:55:19 -------------------------------------------------------------------------------- Compression Therapy Details Patient Name: Date of Service: [name] 04/20/2021 8:45 A M Medical Record Number: [id number] Patient Account Number: [id number] Date of Birth/Sex: Treating RN: [birth year] (70 y.o. [name] Primary Care Yianna Tersigni: [name] Other Clinician: Referring Jahaan Vanwagner: Treating Abanoub Hanken/Extender: Stormy Card in Treatment: 1 Compression Therapy Performed for Wound Assessment: Wound #19 Left,Anterior Lower Leg Performed By: Clinician Deon Pilling, RN Compression Type: Double Layer Post Procedure Diagnosis Same as Pre-procedure Electronic Signature(s) Signed: 04/20/2021 5:43:20 PM By: Deon Pilling RN, BSN Entered By: Deon Pilling on 04/20/2021 09:55:19 -------------------------------------------------------------------------------- Compression Therapy Details Patient Name: Date of Service: [name] 04/20/2021 8:45 A M Medical Record Number: [id number] Patient Account Number: [id number] Date of Birth/Sex: Treating RN: 1951-06-17 (70 y.o. [name] Primary Care Lanetra Hartley: [name] Other Clinician: Referring Rontae Inglett: Treating Dayonna Selbe/Extender: Stormy Card in Treatment: 1 Compression Therapy Performed for Wound Assessment: Wound #21 Left,Medial Lower Leg Performed By: Clinician Deon Pilling, RN Compression Type: Double Layer Post Procedure Diagnosis Same as Pre-procedure Electronic Signature(s) Signed: 04/20/2021 5:43:20 PM By: Deon Pilling RN,  BSN Entered By: Deon Pilling on 04/20/2021 09:55:19 -------------------------------------------------------------------------------- Encounter Discharge Information Details Patient Name: Date of Service: [name] 04/20/2021 8:45 A M Medical Record Number: [id number] Patient Account Number: [id number] Date of Birth/Sex: Treating RN: 02-Jan-1951 (70 y.o. [name] Primary Care Shakiya Mcneary: [name] Other Clinician: Referring Keevin Panebianco: Treating Snigdha Howser/Extender: Stormy Card in Treatment: 1 Encounter Discharge Information Items Post Procedure Vitals Discharge Condition: Stable Temperature (F): 98.2 Ambulatory Status: Ambulatory Pulse (bpm): 67 Discharge Destination: Home Respiratory Rate (breaths/min): 16 Transportation: Private Auto Blood Pressure (mmHg): 129/83  Accompanied By: self Schedule Follow-up Appointment: Yes Clinical Summary of Care: Electronic Signature(s) Signed: 04/20/2021 5:43:20 PM By: Deon Pilling RN, BSN Entered By: Deon Pilling on 04/20/2021 10:08:12 -------------------------------------------------------------------------------- Lower Extremity Assessment Details Patient Name: Date of Service: [name] 04/20/2021 8:45 A M Medical Record Number: [id number] Patient Account Number: [id number] Date of Birth/Sex: Treating RN: [birth year] (70 y.o. [name] Primary Care Demesha Boorman: [name] Other Clinician: Referring Makynzi Eastland: Treating Salvador Bigbee/Extender: Stormy Card in Treatment: 1 Edema Assessment Assessed: [name]: Yes] [name]: No] Edema: [Left: Ye] [Right: s] Calf Left: Right: Point of Measurement: 41 cm From Medial Instep 47 cm Ankle Left: Right: Point of Measurement: 10 cm From Medial Instep 27 cm Vascular Assessment Pulses: Dorsalis Pedis Palpable: [Left:No] Electronic Signature(s) Signed: 04/20/2021 5:43:20 PM By: Deon Pilling RN, BSN Entered By: Deon Pilling on  04/20/2021 09:21:43 -------------------------------------------------------------------------------- Multi Wound Chart Details Patient Name: Date of Service: [name] 04/20/2021 8:45 A M Medical Record Number: [id number] Patient Account Number: [id number] Date of Birth/Sex: Treating RN: 11/22/1950 (70 y.o. [name] Primary Care Ocia Simek: [name] Other Clinician: Referring Jaking Thayer: Treating Aloise Copus/Extender: Stormy Card in Treatment: 1 Vital Signs Height(in): 67 Capillary Blood Glucose(mg/dl): 138 Weight(lbs): 238 Pulse(bpm): 6 Body Mass Index(BMI): 31 Blood Pressure(mmHg): 129/68 Temperature(F): 98.2 Respiratory Rate(breaths/min): 16 Photos: Left, Plantar Metatarsal head first Left, Anterior Lower Leg Left, Distal, Anterior Lower Leg Wound Location: Gradually Appeared Gradually Appeared Gradually Appeared Wounding Event: Diabetic Wound/Ulcer of the Lower Diabetic Wound/Ulcer of the Lower Diabetic Wound/Ulcer of the Lower Primary Etiology: Extremity Extremity Extremity Glaucoma, Sleep Apnea, Glaucoma, Sleep Apnea, Glaucoma, Sleep Apnea, Comorbid History: Hypertension, Peripheral Arterial Hypertension, Peripheral Arterial Hypertension, Peripheral Arterial Disease, Peripheral Venous Disease, Disease, Peripheral Venous Disease, Disease, Peripheral Venous Disease, Type II Diabetes, Gout, Osteoarthritis, Type II Diabetes, Gout, Osteoarthritis, Type II Diabetes, Gout, Osteoarthritis, Neuropathy Neuropathy Neuropathy 08/23/2020 08/23/2020 08/23/2020 Date Acquired: 1 1 1  Weeks of Treatment: Open Open Healed - Epithelialized Wound Status: 4.4x4.5x0.3 4.7x4.5x0.3 0x0x0 Measurements L x W x D (cm) 15.551 16.611 0 A (cm) : rea 4.665 4.983 0 Volume (cm) : -10.00% -14.90% 100.00% % Reduction in A rea: -65.00% -14.90% 100.00% % Reduction in Volume: Grade 2 Grade 2 Grade 2 Classification: Large Large None Present Exudate A  mount: Purulent Purulent N/A Exudate Type: yellow, brown, green yellow, brown, green N/A Exudate Color: Yes Yes No Foul Odor A Cleansing: fter No No N/A Odor A nticipated Due to Product Use: Distinct, outline attached Distinct, outline attached Distinct, outline attached Wound Margin: Large (67-100%) None Present (0%) None Present (0%) Granulation A mount: Red, Pink N/A N/A Granulation Quality: None Present (0%) Large (67-100%) None Present (0%) Necrotic A mount: Fat Layer (Subcutaneous Tissue): Yes Fat Layer (Subcutaneous Tissue): Yes Fat Layer (Subcutaneous Tissue): Yes Exposed Structures: Fascia: No Fascia: No Fascia: No Tendon: No Tendon: No Tendon: No Muscle: No Muscle: No Muscle: No Joint: No Joint: No Joint: No Bone: No Bone: No Bone: No Small (1-33%) None Large (67-100%) Epithelialization: Debridement - Excisional Debridement - Excisional N/A Debridement: Pre-procedure Verification/Time Out 09:45 09:45 N/A Taken: Lidocaine 5% topical ointment Lidocaine 5% topical ointment N/A Pain Control: Subcutaneous, Slough Subcutaneous, Slough N/A Tissue Debrided: Skin/Subcutaneous Tissue Skin/Subcutaneous Tissue N/A Level: 19.8 21.15 N/A Debridement A (sq cm): rea Curette Curette N/A Instrument: Moderate Moderate N/A Bleeding: Pressure Gel Foam N/A Hemostasis A chieved: 0 0 N/A Procedural Pain: 0 0 N/A Post Procedural Pain: Procedure was tolerated well Procedure was tolerated well  N/A Debridement Treatment Response: 4.4x4.5x0.3 4.7x4.5x0.3 N/A Post Debridement Measurements L x W x D (cm) 4.665 4.983 N/A Post Debridement Volume: (cm) Compression Therapy Compression Therapy N/A Procedures Performed: Debridement Debridement Wound Number: 21 N/A N/A Photos: N/A N/A Left, Medial Lower Leg N/A N/A Wound Location: Gradually Appeared N/A N/A Wounding Event: Diabetic Wound/Ulcer of the Lower N/A N/A Primary Etiology: Extremity Glaucoma, Sleep Apnea,  N/A N/A Comorbid History: Hypertension, Peripheral Arterial Disease, Peripheral Venous Disease, Type II Diabetes, Gout, Osteoarthritis, Neuropathy 08/23/2020 N/A N/A Date Acquired: 1 N/A N/A Weeks of Treatment: Open N/A N/A Wound Status: 2x1.3x0.5 N/A N/A Measurements L x W x D (cm) 2.042 N/A N/A A (cm) : rea 1.021 N/A N/A Volume (cm) : 30.70% N/A N/A % Reduction in A rea: -15.50% N/A N/A % Reduction in Volume: Grade 2 N/A N/A Classification: Large N/A N/A Exudate A mount: Purulent N/A N/A Exudate Type: yellow, brown, green N/A N/A Exudate Color: Yes N/A N/A Foul Odor A Cleansing: fter No N/A N/A Odor A nticipated Due to Product Use: Distinct, outline attached N/A N/A Wound Margin: Small (1-33%) N/A N/A Granulation A mount: Red, Pink N/A N/A Granulation Quality: Large (67-100%) N/A N/A Necrotic A mount: Fat Layer (Subcutaneous Tissue): Yes N/A N/A Exposed Structures: Fascia: No Tendon: No Muscle: No Joint: No Bone: No None N/A N/A Epithelialization: Debridement - Excisional N/A N/A Debridement: Pre-procedure Verification/Time Out 09:45 N/A N/A Taken: Lidocaine 5% topical ointment N/A N/A Pain Control: Subcutaneous, Slough N/A N/A Tissue Debrided: Skin/Subcutaneous Tissue N/A N/A Level: 2.6 N/A N/A Debridement A (sq cm): rea Curette N/A N/A Instrument: Moderate N/A N/A Bleeding: Pressure N/A N/A Hemostasis A chieved: 0 N/A N/A Procedural Pain: 0 N/A N/A Post Procedural Pain: Procedure was tolerated well N/A N/A Debridement Treatment Response: 2x1.3x0.5 N/A N/A Post Debridement Measurements L x W x D (cm) 1.021 N/A N/A Post Debridement Volume: (cm) Compression Therapy N/A N/A Procedures Performed: Debridement Treatment Notes Electronic Signature(s) Signed: 04/20/2021 5:45:17 PM By: Levan Hurst RN, BSN Signed: 04/25/2021 4:29:54 PM By: Linton Ham MD Entered By: Linton Ham on 04/20/2021  09:59:04 -------------------------------------------------------------------------------- Multi-Disciplinary Care Plan Details Patient Name: Date of Service: [name] 04/20/2021 8:45 A M Medical Record Number: [id number] Patient Account Number: [id number] Date of Birth/Sex: Treating RN: June 02, 1951 (70 y.o. [name], [name] Primary Care Desarai Barrack: [name] Other Clinician: Referring Verner Mccrone: Treating Lamondre Wesche/Extender: Stormy Card in Treatment: 1 Active Inactive Soft Tissue Infection Nursing Diagnoses: Impaired tissue integrity Potential for infection: soft tissue Goals: Patient's soft tissue infection will resolve Date Initiated: 04/20/2021 Target Resolution Date: 05/19/2021 Goal Status: Active Interventions: Assess signs and symptoms of infection every visit Provide education on infection Treatment Activities: Culture : 04/20/2021 Systemic antibiotics : 04/20/2021 T ordered outside of clinic : 04/20/2021 est Notes: Wound/Skin Impairment Nursing Diagnoses: Impaired tissue integrity Knowledge deficit related to ulceration/compromised skin integrity Goals: Patient will have a decrease in wound volume by X% from date: (specify in notes) Date Initiated: 04/12/2021 Target Resolution Date: 04/23/2021 Goal Status: Active Patient/caregiver will verbalize understanding of skin care regimen Date Initiated: 04/12/2021 Target Resolution Date: 04/25/2021 Goal Status: Active Ulcer/skin breakdown will have a volume reduction of 30% by week 4 Date Initiated: 04/12/2021 Target Resolution Date: 04/27/2021 Goal Status: Active Interventions: Assess patient/caregiver ability to obtain necessary supplies Assess patient/caregiver ability to perform ulcer/skin care regimen upon admission and as needed Assess ulceration(s) every visit Notes: Electronic Signature(s) Signed: 04/20/2021 5:43:20 PM By: Deon Pilling RN, BSN Entered By: Deon Pilling on  04/20/2021 10:06:01 -------------------------------------------------------------------------------- Pain Assessment Details Patient Name: Date of Service: EARVIN, BLAZIER 04/20/2021 8:45 A M Medical Record Number: [id number] Patient Account Number: [id number] Date of Birth/Sex: Treating RN: 01/01/1951 (70 y.o. [name] Primary Care Norah Devin: [name] Other Clinician: Referring Avarey Yaeger: Treating Sinclair Alligood/Extender: Stormy Card in Treatment: 1 Active Problems Location of Pain Severity and Description of Pain Patient Has Paino No Site Locations Rate the pain. Rate the pain. Current Pain Level: 0 Pain Management and Medication Current Pain Management: Medication: No Cold Application: No Rest: No Massage: No Activity: No T.E.N.S.: No Heat Application: No Leg drop or elevation: No Is the Current Pain Management Adequate: Adequate How does your wound impact your activities of daily livingo Sleep: No Bathing: No Appetite: No Relationship With Others: No Bladder Continence: No Emotions: No Bowel Continence: No Work: No Toileting: No Drive: No Dressing: No Hobbies: No Engineer, maintenance) Signed: 04/20/2021 5:43:20 PM By: Deon Pilling RN, BSN Entered By: Deon Pilling on 04/20/2021 09:17:13 -------------------------------------------------------------------------------- Patient/Caregiver Education Details Patient Name: Date of Service: [name] 10/27/2022andnbsp8:45 A M Medical Record Number: [id number] Patient Account Number: [id number] Date of Birth/Gender: Treating RN: [birth year] (70 y.o. [name] Primary Care Physician: [name] Other Clinician: Referring Physician: Treating Physician/Extender: Stormy Card in Treatment: 1 Education Assessment Education Provided To: Patient Education Topics Provided Infection: Handouts: CDC antimicrobial patient education_English, Infection  Prevention and Management Methods: Explain/Verbal Responses: Reinforcements needed Electronic Signature(s) Signed: 04/20/2021 5:43:20 PM By: Deon Pilling RN, BSN Entered By: Deon Pilling on 04/20/2021 10:06:41 -------------------------------------------------------------------------------- Wound Assessment Details Patient Name: Date of Service: [name] 04/20/2021 8:45 A M Medical Record Number: [id number] Patient Account Number: [id number] Date of Birth/Sex: Treating RN: [birth year] (70 y.o. [name], [name] Primary Care Tyteanna Ost: [name] Other Clinician: Referring Amato Sevillano: Treating Conor Lata/Extender: Stormy Card in Treatment: 1 Wound Status Wound Number: 18 Primary Diabetic Wound/Ulcer of the Lower Extremity Etiology: Wound Location: Left, Plantar Metatarsal head first Wound Open Wounding Event: Gradually Appeared Status: Date Acquired: 08/23/2020 Comorbid Glaucoma, Sleep Apnea, Hypertension, Peripheral Arterial Disease, Weeks Of Treatment: 1 History: Peripheral Venous Disease, Type II Diabetes, Gout, Osteoarthritis, Clustered Wound: No Neuropathy Photos Wound Measurements Length: (cm) 4.4 Width: (cm) 4.5 Depth: (cm) 0.3 Area: (cm) 15.551 Volume: (cm) 4.665 % Reduction in Area: -10% % Reduction in Volume: -65% Epithelialization: Small (1-33%) Tunneling: No Undermining: No Wound Description Classification: Grade 2 Wound Margin: Distinct, outline attached Exudate Amount: Large Exudate Type: Purulent Exudate Color: yellow, brown, green Foul Odor After Cleansing: Yes Due to Product Use: No Slough/Fibrino No Wound Bed Granulation Amount: Large (67-100%) Exposed Structure Granulation Quality: Red, Pink Fascia Exposed: No Necrotic Amount: None Present (0%) Fat Layer (Subcutaneous Tissue) Exposed: Yes Tendon Exposed: No Muscle Exposed: No Joint Exposed: No Bone Exposed: No Electronic Signature(s) Signed: 04/20/2021 5:43:20  PM By: Deon Pilling RN, BSN Entered By: Deon Pilling on 04/20/2021 09:32:57 -------------------------------------------------------------------------------- Wound Assessment Details Patient Name: Date of Service: [name] 04/20/2021 8:45 A M Medical Record Number: [id number] Patient Account Number: [id number] Date of Birth/Sex: Treating RN: January 21, 1951 (69 y.o. [name] Primary Care Stanisha Lorenz: [name] Other Clinician: Referring Stone Spirito: Treating Toni Hoffmeister/Extender: Stormy Card in Treatment: 1 Wound Status Wound Number: 19 Primary Diabetic Wound/Ulcer of the Lower Extremity Etiology: Wound Location: Left, Anterior Lower Leg Wound Open Wounding Event: Gradually Appeared Status: Date Acquired: 08/23/2020 Comorbid Glaucoma, Sleep Apnea, Hypertension, Peripheral Arterial Disease, Weeks Of  Treatment: 1 History: Peripheral Venous Disease, Type II Diabetes, Gout, Osteoarthritis, Clustered Wound: No Neuropathy Photos Wound Measurements Length: (cm) 4.7 Width: (cm) 4.5 Depth: (cm) 0.3 Area: (cm) 16.611 Volume: (cm) 4.983 % Reduction in Area: -14.9% % Reduction in Volume: -14.9% Epithelialization: None Tunneling: No Undermining: No Wound Description Classification: Grade 2 Wound Margin: Distinct, outline attached Exudate Amount: Large Exudate Type: Purulent Exudate Color: yellow, brown, green Foul Odor After Cleansing: Yes Due to Product Use: No Slough/Fibrino Yes Wound Bed Granulation Amount: None Present (0%) Exposed Structure Necrotic Amount: Large (67-100%) Fascia Exposed: No Necrotic Quality: Adherent Slough Fat Layer (Subcutaneous Tissue) Exposed: Yes Tendon Exposed: No Muscle Exposed: No Joint Exposed: No Bone Exposed: No Electronic Signature(s) Signed: 04/20/2021 5:43:20 PM By: Deon Pilling RN, BSN Entered By: Deon Pilling on 04/20/2021  09:34:11 -------------------------------------------------------------------------------- Wound Assessment Details Patient Name: Date of Service: [name] 04/20/2021 8:45 A M Medical Record Number: [id number] Patient Account Number: [id number] Date of Birth/Sex: Treating RN: September 12, 1950 (70 y.o. [name], [name] Primary Care Aeisha Minarik: [name] Other Clinician: Referring Binyamin Nelis: Treating Rani Sisney/Extender: Stormy Card in Treatment: 1 Wound Status Wound Number: 20 Primary Diabetic Wound/Ulcer of the Lower Extremity Etiology: Wound Location: Left, Distal, Anterior Lower Leg Wound Healed - Epithelialized Wounding Event: Gradually Appeared Status: Date Acquired: 08/23/2020 Comorbid Glaucoma, Sleep Apnea, Hypertension, Peripheral Arterial Disease, Weeks Of Treatment: 1 History: Peripheral Venous Disease, Type II Diabetes, Gout, Osteoarthritis, Clustered Wound: No Neuropathy Photos Wound Measurements Length: (cm) Width: (cm) Depth: (cm) Area: (cm) Volume: (cm) 0 % Reduction in Area: 100% 0 % Reduction in Volume: 100% 0 Epithelialization: Large (67-100%) 0 Tunneling: No 0 Undermining: No Wound Description Classification: Grade 2 Wound Margin: Distinct, outline attached Exudate Amount: None Present Foul Odor After Cleansing: No Slough/Fibrino Yes Wound Bed Granulation Amount: None Present (0%) Exposed Structure Necrotic Amount: None Present (0%) Fascia Exposed: No Fat Layer (Subcutaneous Tissue) Exposed: Yes Tendon Exposed: No Muscle Exposed: No Joint Exposed: No Bone Exposed: No Electronic Signature(s) Signed: 04/20/2021 5:43:20 PM By: Deon Pilling RN, BSN Entered By: Deon Pilling on 04/20/2021 09:34:56 -------------------------------------------------------------------------------- Wound Assessment Details Patient Name: Date of Service: [name] 04/20/2021 8:45 A M Medical Record Number: [id number] Patient Account  Number: [id number] Date of Birth/Sex: Treating RN: 01/31/1951 (70 y.o. [name], [name] Primary Care Hunter Pinkard: [name] Other Clinician: Referring Oliviarose Punch: Treating Roselynne Lortz/Extender: Stormy Card in Treatment: 1 Wound Status Wound Number: 21 Primary Diabetic Wound/Ulcer of the Lower Extremity Etiology: Wound Location: Left, Medial Lower Leg Wound Open Wounding Event: Gradually Appeared Status: Date Acquired: 08/23/2020 Comorbid Glaucoma, Sleep Apnea, Hypertension, Peripheral Arterial Disease, Weeks Of Treatment: 1 History: Peripheral Venous Disease, Type II Diabetes, Gout, Osteoarthritis, Clustered Wound: No Clustered Wound: No Neuropathy Photos Wound Measurements Length: (cm) 2 Width: (cm) 1.3 Depth: (cm) 0.5 Area: (cm) 2.042 Volume: (cm) 1.021 % Reduction in Area: 30.7% % Reduction in Volume: -15.5% Epithelialization: None Tunneling: No Undermining: No Wound Description Classification: Grade 2 Wound Margin: Distinct, outline attached Exudate Amount: Large Exudate Type: Purulent Exudate Color: yellow, brown, green Foul Odor After Cleansing: Yes Due to Product Use: No Slough/Fibrino Yes Wound Bed Granulation Amount: Small (1-33%) Exposed Structure Granulation Quality: Red, Pink Fascia Exposed: No Necrotic Amount: Large (67-100%) Fat Layer (Subcutaneous Tissue) Exposed: Yes Necrotic Quality: Adherent Slough Tendon Exposed: No Muscle Exposed: No Joint Exposed: No Bone Exposed: No Electronic Signature(s) Signed: 04/20/2021 5:43:20 PM By: Deon Pilling RN, BSN Entered By: Deon Pilling on 04/20/2021 09:33:32 --------------------------------------------------------------------------------  Vitals Details Patient Name: Date of Service: CALDEN, DORSEY 04/20/2021 8:45 A M Medical Record Number: [id number] Patient Account Number: [id number] Date of Birth/Sex: Treating RN: 12-08-1950 (70 y.o. [name], [name] Primary Care Ahlijah Raia:  [name] Other Clinician: Referring Markice Torbert: Treating Kassy Mcenroe/Extender: Stormy Card in Treatment: 1 Vital Signs Time Taken: 09:17 Temperature (F): 98.2 Height (in): 74 Pulse (bpm): 67 Weight (lbs): 238 Respiratory Rate (breaths/min): 16 Body Mass Index (BMI): 30.6 Blood Pressure (mmHg): 129/68 Capillary Blood Glucose (mg/dl): 138 Reference Range: 80 - 120 mg / dl Electronic Signature(s) Signed: 04/20/2021 5:43:20 PM By: Deon Pilling RN, BSN Entered By: Deon Pilling on 04/20/2021 09:17:50

## 2021-04-25 NOTE — Progress Notes (Signed)
ADRIK, KHIM ([id number]) Visit Report for 04/24/2021 SuperBill Details Patient Name: Date of Service: [name], [name] 04/24/2021 Medical Record Number: [id number] Patient Account Number: [id number] Date of Birth/Sex: Treating RN: May 31, 1951 (70 y.o. 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

## 2021-04-25 NOTE — Progress Notes (Signed)
CAPERS, HAGMANN ([id number]) Visit Report for 04/20/2021 Debridement Details Patient Name: Date of Service: [name], [name] 04/20/2021 8:45 A M Medical Record Number: [id number] Patient Account Number: [id number] Date of Birth/Sex: Treating RN: 1950-08-11 (70 y.o. [name] Primary Care Provider: [name] Other Clinician: Referring Provider: Treating Provider/Extender: Stormy Card in Treatment: 1 Debridement Performed for Assessment: Wound #18 Left,Plantar Metatarsal head first Performed By: Physician Ricard Dillon., MD Debridement Type: Debridement Severity of Tissue Pre Debridement: Fat layer exposed Level of Consciousness (Pre-procedure): Awake and Alert Pre-procedure Verification/Time Out Yes - 09:45 Taken: Start Time: 09:46 Pain Control: Lidocaine 5% topical ointment T Area Debrided (L x W): otal 4.4 (cm) x 4.5 (cm) = 19.8 (cm) Tissue and other material debrided: Viable, Non-Viable, Slough, Subcutaneous, Skin: Dermis , Skin: Epidermis, Fibrin/Exudate, Slough Level: Skin/Subcutaneous Tissue Debridement Description: Excisional Instrument: Curette Bleeding: Moderate Hemostasis Achieved: Pressure End Time: 09:55 Procedural Pain: 0 Post Procedural Pain: 0 Response to Treatment: Procedure was tolerated well Level of Consciousness (Post- Awake and Alert procedure): Post Debridement Measurements of Total Wound Length: (cm) 4.4 Width: (cm) 4.5 Depth: (cm) 0.3 Volume: (cm) 4.665 Character of Wound/Ulcer Post Debridement: Improved Severity of Tissue Post Debridement: Fat layer exposed Post Procedure Diagnosis Same as Pre-procedure Electronic Signature(s) Signed: 04/20/2021 5:45:17 PM By: Levan Hurst RN, BSN Signed: 04/25/2021 4:29:54 PM By: Linton Ham MD Entered By: Linton Ham on 04/20/2021 09:59:31 -------------------------------------------------------------------------------- Debridement Details Patient Name: Date of  Service: [name] Garfinkel. 04/20/2021 8:45 A M Medical Record Number: [id number] Patient Account Number: [id number] Date of Birth/Sex: Treating RN: [birth year] (70 y.o. [name] Primary Care Provider: [name] Other Clinician: Referring Provider: Treating Provider/Extender: Stormy Card in Treatment: 1 Debridement Performed for Assessment: Wound #19 Left,Anterior Lower Leg Performed By: Physician Ricard Dillon., MD Debridement Type: Debridement Severity of Tissue Pre Debridement: Fat layer exposed Level of Consciousness (Pre-procedure): Awake and Alert Pre-procedure Verification/Time Out Yes - 09:45 Taken: Start Time: 09:46 Pain Control: Lidocaine 5% topical ointment T Area Debrided (L x W): otal 4.7 (cm) x 4.5 (cm) = 21.15 (cm) Tissue and other material debrided: Viable, Non-Viable, Slough, Subcutaneous, Skin: Dermis , Skin: Epidermis, Fibrin/Exudate, Slough Level: Skin/Subcutaneous Tissue Debridement Description: Excisional Instrument: Curette Specimen: Tissue Culture Number of Specimens T aken: 1 Bleeding: Moderate Hemostasis Achieved: Gel Foam End Time: 09:55 Procedural Pain: 0 Post Procedural Pain: 0 Response to Treatment: Procedure was tolerated well Level of Consciousness (Post- Awake and Alert procedure): Post Debridement Measurements of Total Wound Length: (cm) 4.7 Width: (cm) 4.5 Depth: (cm) 0.3 Volume: (cm) 4.983 Character of Wound/Ulcer Post Debridement: Improved Severity of Tissue Post Debridement: Fat layer exposed Post Procedure Diagnosis Same as Pre-procedure Electronic Signature(s) Signed: 04/20/2021 5:45:17 PM By: Levan Hurst RN, BSN Signed: 04/25/2021 4:29:54 PM By: Linton Ham MD Entered By: Linton Ham on 04/20/2021 09:59:55 -------------------------------------------------------------------------------- Debridement Details Patient Name: Date of Service: [name] Garfinkel. 04/20/2021 8:45 A  M Medical Record Number: [id number] Patient Account Number: [id number] Date of Birth/Sex: Treating RN: 10-16-1950 (70 y.o. [name] Primary Care Provider: [name] Other Clinician: Referring Provider: Treating Provider/Extender: Stormy Card in Treatment: 1 Debridement Performed for Assessment: Wound #21 Left,Medial Lower Leg Performed By: Physician Ricard Dillon., MD Debridement Type: Debridement Severity of Tissue Pre Debridement: Fat layer exposed Level of Consciousness (Pre-procedure): Awake and Alert Pre-procedure Verification/Time Out Yes - 09:45 Taken: Start Time: 09:46 Pain Control: Lidocaine 5% topical ointment T Area  Debrided (L x W): otal 2 (cm) x 1.3 (cm) = 2.6 (cm) Tissue and other material debrided: Viable, Non-Viable, Slough, Subcutaneous, Skin: Dermis , Skin: Epidermis, Fibrin/Exudate, Slough Level: Skin/Subcutaneous Tissue Debridement Description: Excisional Instrument: Curette Bleeding: Moderate Hemostasis Achieved: Pressure End Time: 09:55 Procedural Pain: 0 Post Procedural Pain: 0 Response to Treatment: Procedure was tolerated well Level of Consciousness (Post- Awake and Alert procedure): Post Debridement Measurements of Total Wound Length: (cm) 2 Width: (cm) 1.3 Depth: (cm) 0.5 Volume: (cm) 1.021 Character of Wound/Ulcer Post Debridement: Improved Severity of Tissue Post Debridement: Fat layer exposed Post Procedure Diagnosis Same as Pre-procedure Electronic Signature(s) Signed: 04/20/2021 5:45:17 PM By: Levan Hurst RN, BSN Signed: 04/25/2021 4:29:54 PM By: Linton Ham MD Entered By: Linton Ham on 04/20/2021 10:00:21 -------------------------------------------------------------------------------- HPI Details Patient Name: Date of Service: [name] Garfinkel. 04/20/2021 8:45 A M Medical Record Number: [id number] Patient Account Number: [id number] Date of Birth/Sex: Treating RN: 03-Mar-1951 (70 y.o.  [name] Primary Care Provider: [name] Other Clinician: Referring Provider: Treating Provider/Extender: Stormy Card in Treatment: 1 History of Present Illness HPI Description: 10/11/17; [name] is a 70 year old man who tells me that in 2015 he slipped down the latter traumatizing his left leg. He developed a wound in the same spot the area that we are currently looking at. He states this closed over for the most part although he always felt it was somewhat unstable. In 2016 he hit the same area with the door of his car had this reopened. He tells me that this is never really closed although sometimes an inflow it remains open on a constant basis. He has not been using any specific dressing to this except for topical antibiotics the nature of which were not really sure. His primary doctor did send him to see Dr. [name] of interventional cardiology. He underwent an angiogram on 08/06/17 and he underwent a PTA and directional atherectomy of the lesser distal SFA and popliteal arteries which resulted in brisk improvement in blood flow. It was noted that he had 2 vessel runoff through the anterior tibial and peroneal. He is also been to see vascular and interventional radiologist. He was not felt to have any significant superficial venous insufficiency. Presumably is not a candidate for any ablation. It was suggested he come here for wound care. The patient is a type II diabetic on insulin. He also has a history of venous insufficiency. ABIs on the left were noncompressible in our clinic 10/21/17; patient we admitted to the clinic last week. 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 We put Iodosorb on him after an aggressive debridement and 3 layer compression. He complained of pain in his ankle and itching with is skin in fact he scratched the area on the medial calf superiorly at the rim of our  wraps and he has 2 small open areas in that location today which are new. I changed his primary dressing today to silver collagen. As noted he is already had revascularization and does not have any significant superficial venous insufficiency that would be amenable to ablation 10/28/17; patient admitted to the clinic 2 weeks ago. He has a smaller Wound. Scratch injury from last week revealed. There is large wound over the tibial area. This is smaller. Granulation looks healthy. No need for debridement. 11/04/17; the wound on the left lateral calf looks better. Improved dimensions. Surface of this looks better. We've been  maintaining him and Kerlix Coban wraps. He finds this much more comfortable. Silver collagen dressing 11/11/17; left lateral Wound continues to look healthy be making progress. Using a #5 curet I removed removed nonviable skin from the surface of the wound and then necrotic debris from the wound surface. Surface of the wound continues to look healthy. He also has an open area on the left great toenail bed. We've been using topical antibiotics. 11/19/17; left anterior lateral wound continues to look healthy but it's not closed. He also had a small wound above this on the left leg Initially traumatic wounds in the setting of significant chronic venous insufficiency and stasis dermatitis 11/25/17; left anterior wounds superiorly is closed still a small wound inferiorly. 12/02/17; left anterior tibial area. Arrives today with adherent callus. Post debridement clearly not completely closed. Hydrofera Blue under 3 layer compression. 12/09/17; left anterior tibia. Circumferential eschar however the wound bed looks stable to improved. We've been using Hydrofera Blue under 3 layer compression 12/17/17; left anterior tibia. Apparently this was felt to be closed however when the wrap was taken off there is a skin tear to reopen wounds in the same area we've been using Hydrofera Blue under 3 layer  compression 12/23/17 left anterior tibia. Not close to close this week apparently the [facility] was stuck to this again. Still circumferential eschar requiring debridement. I put a contact layer on this this time under the Hydrofera Blue 12/31/17; left anterior tibia. Wound is better slight amount of hyper-granulation. Using Hydrofera Blue over Adaptic. 01/07/18; left anterior tibia. The wound had some surface eschar however after this was removed he has no open wound.he was already revascularized by Dr. [name] when he came to our clinic with atherectomy of the left SFA and popliteal artery. He was also sent to interventional radiology for venous reflux studies. He was not felt to have significant reflux but certainly has chronic venous changes of his skin with hemosiderin deposition around this area. He will definitely need to lubricate his skin and wear compression stocking and I've talked to him about this. READMISSION 05/26/2018 This is a now 70 year old man we cared for with traumatic wounds on his left anterior lower extremity. He had been previously revascularized during that admission by Dr. [name]. Apparently in follow-up Dr. [name] noted that he had deterioration in his arterial status. He underwent a stent placement in the distal left SFA on 04/22/2018. 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 He went back to the angiography suite on 04/30/2018 he underwent PTA and balloon angioplasty of the occluded left mid anterior tibial artery, thrombotic occlusion went from 100 to 0% which reconstitutes the posterior tibial artery. He had thrombectomy and aspiration of the peroneal artery. The stent placed in the distal SFA left SFA was still occluded. 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 Noninvasive studies on 05/02/2018  showed an TBI on the left at 0.43 and 0.82 on the right. He has been recuperating at [facility] after the most recent hospitalization. He is going home tomorrow. He tells me that 2 weeks ago he traumatized the tip of his left fifth toe. He came in urgently for our review of this. This was a history of before I noted that Dr. [name] had already noted dry gangrenous changes of the left fifth toe 06/09/2018;  2-week follow-up. I did contact Dr. [name] after his last appointment and he apparently saw 1 of Dr. Irven Shelling colleagues the next day. He does not follow-up with Dr. [name] himself until Thursday of this week. 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 Nevertheless there is no evidence of infection no drainage and no pain. He had a new area that this week when we were signing him in today on the left anterior mid tibia area, this is in close proximity to the previous wound we have dealt with in this clinic. 06/23/2018; 2-week follow-up. I did not receive a recent note from Dr. [name] to review today. Our office is trying to obtain this. He is apparently not planning to do further vascular interventions and wondered about compression to try and help with the patient's chronic venous insufficiency. However we are also concerned about the arterial flow. He arrives in clinic today with a new area on the left third toe. The areas on the calf/anterior tibia are close to closing. The left fifth toe is still mummified using Betadine. -In reviewing things with the patient he has what sounds like claudication with mild to moderate amount of activity. 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 I prescribed Levaquin over the phone while we attempted to arrange a plan of care. However the patient called yesterday to report he had low-grade fever and he came in today acutely. There is been a marked deterioration in the left third toe with  spreading cellulitis up into the dorsal left foot. He was referred to the emergency room. Readmission: 06/29/2020 patient presents today for reevaluation here in our clinic he was previously treated by Dr. Dellia Nims at the latter part of 2019 in 2 the beginning of 2020. Subsequently we have not seen him since that time in the interim he did have evaluation with vein and vascular specialist specifically Dr. Anice Paganini who did perform quite extensive work for a left femoral to anterior tibial artery bypass. With that being said in the interim the patient has developed significant lymphedema and has wounds that he tells me have really never healed in regard to the incision site on the left leg. He also has multiple wounds on the feet for various reasons some of which is that he tends to pick at his feet. Fortunately there is no signs of active infection systemically at this time he does have some wounds that are little bit deeper but most are fairly superficial he seems to have good blood flow and overall everything appears to be healthy I see no bone exposed and no obvious signs of osteomyelitis. I do not know that he necessarily needs a x-ray at this point although that something we could consider depending on how things progress. The patient does have a history of lymphedema, diabetes, this is type II, chronic kidney disease stage III, hypertension, and history of peripheral vascular disease. 07/05/2020; patient admitted last week. Is a patient I remember from 2019 he had a spreading infection involving the left foot and we sent him to the hospital. 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 He subsequently had a left femoral to anterior tibial bypass by Dr.[facility]ain vein and vascular. He also has severe lymphedema with chronic skin changes related to that on the left leg. The most problematic area that was new today was on the left medial great toe. This was  apparently a  small area last week there was purulent drainage which our intake nurse cultured. Also areas on the left medial foot and heel left lateral foot. He has 2 areas on the left medial calf left lateral calf in the setting of the severe lymphedema. 07/13/2020 on evaluation today patient appears to be doing better in my opinion compared to his last visit. The good news is there is no signs of active infection systemically and locally I do not see any signs of infection either. He did have an x-ray which was negative that is great news he had a culture which showed MRSA but at the same time he is been on the doxycycline which has helped. I do think we may want to extend this for 7 additional days 1/25; patient admitted to the clinic a few weeks ago. 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 We have been putting him under compression his edema control is a lot better but he is severe verricused skin on the left leg. He is really done quite well he still has an open area on the left medial calf and the left medial first metatarsal head. We have been using silver collagen on the leg silver alginate on the foot 07/27/2020 upon evaluation today patient appears to be doing decently well in regard to his wounds. He still has a lot of dry skin on the left leg. Some of this is starting to peel back and I think he may be able to have them out by removing some that today. Fortunately there is no signs of active infection at this time on the left leg although on the right leg he does appear to have swelling and erythema as well as some mild warmth to touch. This does have been concerned about the possibility of cellulitis although within the differential diagnosis I do think that potentially a DVT has to be at least considered. We need to rule that out before proceeding would just call in the cellulitis. Especially since he is having pain in the posterior aspect of his calf  muscle. 2/8; the patient had seen sparingly. He has severe skin changes of chronic lymphedema in the left leg thickened hyperkeratotic verrucous skin. He has an open wound on the medial part of the left first met head left mid tibia. He also has a rim of nonepithelialized skin in the anterior mid tibia. He brought in the AmLactin lotion that was been prescribed although I am not sure under compression and its utility. There concern about cellulitis on the right lower leg the last time he was here. He was put on on antibiotics. His DVT rule out was negative. The right leg looks fine he is using his stocking on this area 08/10/2020 upon evaluation today patient appears to be doing well with regard to his leg currently. He has been tolerating the dressing changes without complication. Fortunately there is no signs of active infection which is great news. Overall very pleased with where things stand. 2/22; the patient still has an area on the medial part of the left first met his head. This looks better than when I last saw this earlier this month he has a rim of epithelialization but still some surface debris. Mostly everything on the left leg is healed. There is still a vulnerable in the left mid tibia area. 08/30/2020 upon evaluation today patient appears to be doing much better in regard to his wounds on his  foot. 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 Nonetheless I think we will address that today I Minna send in a prescription for him in that regard. Overall though there does not appear to be any signs of significant worsening. 09/07/2020 on evaluation today patient's wounds over his left foot appear to be doing excellent. I do not see any signs of infection there is some callus buildup this can require debridement for certain but overall I feel like he is managing  quite nicely. He still using the AmLactin cream which has been beneficial for him as well. 3/22; left foot wound is closed. There is no open area here. He is using ammonium lactate lotion to the lower extremities to help exfoliate dry cracked skin. He has compression stockings from [facility] in [place]. The wound on the medial part of his left first met head is healed today. READMISSION 04/12/2021 [name] is a patient we know fairly well he had a prolonged stay in clinic in 2019 with wounds on his left lateral and left anterior lower extremity in the setting of chronic venous insufficiency. More recently he was here earlier this year with predominantly an area on his left foot first metatarsal head plantar and he says the plantar foot broke down on its not long after we discharged him but he did not come back here. The last few months areas of broken down on his left anterior and again the left lateral lower extremity. The leg itself is very swollen chronically enlarged a lot of hyperkeratotic dry Berry Q skin in the left lower leg. His edema extends well into the thigh. He was seen by Dr. Donzetta Matters. He had ABIs on 03/02/2021 showing an ABI on the right of 1 with a TBI of 0.72 his ABI in the left at 1.09 TBI of 0.99. Monophasic and biphasic waveforms on the right. On the left monophasic waveforms were noted he went on to have an angiogram on 03/27/2021 this showed the aortic aortic and iliac segments were free of flow-limiting stenosis the left common femoral vein to evaluate the left femoral to anterior tibial artery bypass was unobstructed the bypass was patent without any areas of stenosis. We discharged the patient in bilateral juxta lite stockings but very clearly that was not sufficient to control the swelling and maintain skin integrity. He is clearly going to need compression pumps. 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 This is  fortunate because he is on his feet for long periods of time. 10/27; patient comes in with our intake nurse reporting copious amount of green drainage from the left anterior mid tibia the left dorsal foot and to a lesser extent the left medial mid tibia. We left the compression wrap on all week for the amount of edema in his left leg is quite a bit better. We use silver alginate as the primary dressing Electronic Signature(s) Signed: 04/25/2021 4:29:54 PM By: Linton Ham MD Entered By: Linton Ham on 04/20/2021 10:01:20 -------------------------------------------------------------------------------- Physical Exam Details Patient Name: Date of Service: [name] Garfinkel. 04/20/2021 8:45 A M Medical Record Number: [id number] Patient Account Number: [id number] Date of Birth/Sex: Treating RN: [birth year] (70 y.o. [name] Primary Care Provider: [name] Other Clinician: Referring Provider: Treating Provider/Extender: Sim Boast  Weeks in Treatment: 1 Constitutional Sitting or standing Blood Pressure is within target range for patient.. Pulse regular and within target range for patient.Marland Kitchen Respirations regular, non-labored and within target range.. Temperature is normal and within the target range for the patient.Marland Kitchen Appears in no distress. Notes Wound exam; left lateral leg wound completely covered in a thick gelatinous green surface. I used a #5 curette to remove this and was able to get to a decent granulation but a deeper wound. I then used another 5 curette to obtain a specimen for deep tissue culture unfortunately unroofed a venous bleeder requiring silver nitrate and Gelfoam and a pressure dressing. The area on the left first metatarsal head plantar does not look particularly bad although drainage per our intake staff. I have very gritty surface debrided with a #5 curette similar fashion to the left medial mid tibial wound Electronic Signature(s) Signed:  04/25/2021 4:29:54 PM By: Linton Ham MD Entered By: Linton Ham on 04/20/2021 10:02:55 -------------------------------------------------------------------------------- Physician Orders Details Patient Name: Date of Service: [name] Garfinkel. 04/20/2021 8:45 A M Medical Record Number: [id number] Patient Account Number: [id number] Date of Birth/Sex: Treating RN: December 15, 1950 (70 y.o. [name], [name] Primary Care Provider: [name] Other Clinician: Referring Provider: Treating Provider/Extender: Stormy Card in Treatment: 1 Verbal / Phone Orders: No Diagnosis Coding ICD-10 Coding Code Description E11.621 Type 2 diabetes mellitus with foot ulcer 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 I89.0 Lymphedema, not elsewhere classified I87.322 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verity 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 of other part of left foot with 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 - Dr. Dellia Nims Thursday Return A Nurse Visit: - Monday 04/24/2021 Bathing/ Shower/ Hygiene May shower with protection but do not get wound dressing(s) wet. - Use a cast protector so you can shower without getting your wrap(s) wet Edema Control - Lymphedema / SCD / Other Elevate legs to the level of the heart or above for 30 minutes daily and/or when sitting, a frequency of: Avoid standing for long periods of time. Patient to wear own compression stockings every day. - on right leg; UNNABOOT TO LLE!!!! Moisturize legs daily. - Ammonium LACTATE to BLE every day. Wound Treatment Wound #18 - Metatarsal head first Wound Laterality: Plantar, Left Cleanser: Normal Saline 1 x Per Week/7 Days Discharge Instructions:  Cleanse the wound with Normal Saline prior to applying a clean dressing using gauze sponges, not tissue or cotton balls. Cleanser: Soap and Water 1 x Per Week/7 Days Discharge Instructions: May shower and wash wound with dial antibacterial soap and water prior to dressing change. Cleanser: Wound Cleanser 1 x Per Week/7 Days Discharge Instructions: Cleanse the wound with wound cleanser prior to applying a clean dressing using gauze sponges, not tissue or cotton balls. Peri-Wound Care: Triamcinolone 15 (g) 1 x Per Week/7 Days Discharge Instructions: Use triamcinolone 15 (g) as directed Peri-Wound Care: Zinc Oxide Ointment 30g tube 1 x Per Week/7 Days Discharge Instructions: Apply Zinc Oxide to periwound with each dressing change Peri-Wound Care: Sween Lotion (Moisturizing lotion) 1 x Per Week/7 Days Discharge Instructions: Apply moisturizing lotion as directed Topical: Gentamicin 1 x Per Week/7 Days Discharge Instructions: As directed by physician Prim Dressing: KerraCel Ag Gelling Fiber Dressing, 4x5 in (silver alginate) 1 x Per Week/7 Days ary Discharge Instructions:  Apply silver alginate to wound bed as instructed Secondary Dressing: ABD Pad, 5x9 1 x Per Week/7 Days Discharge Instructions: Apply over primary dressing as directed. Secondary Dressing: Zetuvit Plus 4x8 in 1 x Per Week/7 Days Discharge Instructions: Apply over primary dressing as directed. Secondary Dressing: CarboFLEX Odor Control Dressing, 4x4 in 1 x Per Week/7 Days Discharge Instructions: Apply over primary dressing as directed. Compression Wrap: Unnaboot w/Calamine, 4x10 (in/yd) 1 x Per Week/7 Days Discharge Instructions: Apply Unnaboot as directed. May also use Miliken CoFlex Calamine 2 layer compression system as alternative. Wound #19 - Lower Leg Wound Laterality: Left, Anterior Cleanser: Normal Saline 1 x Per Week/7 Days Discharge Instructions: Cleanse the wound with Normal Saline prior to applying a clean dressing  using gauze sponges, not tissue or cotton balls. Cleanser: Soap and Water 1 x Per Week/7 Days Discharge Instructions: May shower and wash wound with dial antibacterial soap and water prior to dressing change. Cleanser: Wound Cleanser 1 x Per Week/7 Days Discharge Instructions: Cleanse the wound with wound cleanser prior to applying a clean dressing using gauze sponges, not tissue or cotton balls. Peri-Wound Care: Triamcinolone 15 (g) 1 x Per Week/7 Days Discharge Instructions: Use triamcinolone 15 (g) as directed Peri-Wound Care: Sween Lotion (Moisturizing lotion) 1 x Per Week/7 Days Discharge Instructions: Apply moisturizing lotion as directed Topical: Gentamicin 1 x Per Week/7 Days Discharge Instructions: As directed by physician Prim Dressing: KerraCel Ag Gelling Fiber Dressing, 4x5 in (silver alginate) 1 x Per Week/7 Days ary Discharge Instructions: Apply silver alginate to wound bed as instructed Secondary Dressing: ABD Pad, 5x9 1 x Per Week/7 Days Discharge Instructions: Apply over primary dressing as directed. Secondary Dressing: Zetuvit Plus 4x8 in 1 x Per Week/7 Days Discharge Instructions: Apply over primary dressing as directed. Secondary Dressing: CarboFLEX Odor Control Dressing, 4x4 in 1 x Per Week/7 Days Discharge Instructions: Apply over primary dressing as directed. Compression Wrap: Unnaboot w/Calamine, 4x10 (in/yd) 1 x Per Week/7 Days Discharge Instructions: Apply Unnaboot as directed. May also use Miliken CoFlex Calamine 2 layer compression system as alternative. Wound #21 - Lower Leg Wound Laterality: Left, Medial Cleanser: Normal Saline 1 x Per Week/7 Days Discharge Instructions: Cleanse the wound with Normal Saline prior to applying a clean dressing using gauze sponges, not tissue or cotton balls. Cleanser: Soap and Water 1 x Per Week/7 Days Discharge Instructions: May shower and wash wound with dial antibacterial soap and water prior to dressing change. Cleanser:  Wound Cleanser 1 x Per Week/7 Days Discharge Instructions: Cleanse the wound with wound cleanser prior to applying a clean dressing using gauze sponges, not tissue or cotton balls. Peri-Wound Care: Triamcinolone 15 (g) 1 x Per Week/7 Days Discharge Instructions: Use triamcinolone 15 (g) as directed Peri-Wound Care: Sween Lotion (Moisturizing lotion) 1 x Per Week/7 Days Discharge Instructions: Apply moisturizing lotion as directed Topical: Gentamicin 1 x Per Week/7 Days Discharge Instructions: As directed by physician Prim Dressing: KerraCel Ag Gelling Fiber Dressing, 4x5 in (silver alginate) 1 x Per Week/7 Days ary Discharge Instructions: Apply silver alginate to wound bed as instructed Secondary Dressing: ABD Pad, 5x9 1 x Per Week/7 Days Discharge Instructions: Apply over primary dressing as directed. Secondary Dressing: Zetuvit Plus 4x8 in 1 x Per Week/7 Days Discharge Instructions: Apply over primary dressing as directed. Secondary Dressing: CarboFLEX Odor Control Dressing, 4x4 in 1 x Per Week/7 Days Discharge Instructions: Apply over primary dressing as directed. Compression Wrap: Unnaboot w/Calamine, 4x10 (in/yd) 1 x Per Week/7 Days Discharge Instructions: Apply Unnaboot as  directed. May also use Miliken CoFlex Calamine 2 layer compression system as alternative. Laboratory erobe culture (MICRO) - deep tissue culture of left anterior lower leg wound. Bacteria identified in Unspecified specimen by A LOINC Code: 308-6 Convenience Name: Areobic culture-specimen not specified Electronic Signature(s) Signed: 04/20/2021 5:43:20 PM By: Deon Pilling RN, BSN Signed: 04/25/2021 4:29:54 PM By: Linton Ham MD Entered By: Deon Pilling on 04/20/2021 10:02:08 -------------------------------------------------------------------------------- Problem List Details Patient Name: Date of Service: [name] Garfinkel. 04/20/2021 8:45 A M Medical Record Number: [id number] Patient Account Number:  [id number] Date of Birth/Sex: Treating RN: 1950/12/28 (70 y.o. [name] Primary Care Provider: [name] Other Clinician: Referring Provider: Treating Provider/Extender: Stormy Card in Treatment: 1 Active Problems ICD-10 Encounter Code Description Active Date MDM Diagnosis E11.621 Type 2 diabetes mellitus with foot ulcer 04/12/2021 No Yes 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 04/12/2021 No Yes I89.0 Lymphedema, not elsewhere classified 04/12/2021 No Yes I87.322 Chronic venous hypertension (idiopathic) with inflammation of left lower 04/12/2021 No Yes extremity 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 ulcer of other part of left lower leg with other specified 04/12/2021 No Yes severity 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 of other part of left foot with other specified 04/12/2021 No Yes severity E11.42 Type 2 diabetes mellitus with diabetic polyneuropathy 04/12/2021 No Yes Inactive Problems Resolved Problems Electronic Signature(s) Signed: 04/25/2021 4:29:54 PM By: Linton Ham MD Entered By: Linton Ham on 04/20/2021 09:58:44 -------------------------------------------------------------------------------- Progress Note Details Patient Name: Date of Service: [name] Garfinkel. 04/20/2021 8:45 A M Medical Record Number: [id number] Patient Account Number: [id number] Date of Birth/Sex: Treating RN: [birth year] (70 y.o. [name] Primary Care Provider: [name] Other Clinician: Referring Provider: Treating Provider/Extender: Stormy Card in Treatment: 1 Subjective History of Present Illness (HPI) 10/11/17; [name] is a 70 year old man who tells me that in 2015 he slipped down the latter traumatizing his left leg. He developed a wound in the same spot the area that we are currently looking at. He states this closed over for the most part although he always felt it was  somewhat unstable. In 2016 he hit the same area with the door of his car had this reopened. He tells me that this is never really closed although sometimes an inflow it remains open on a constant basis. He has not been using any specific dressing to this except for topical antibiotics the nature of which were not really sure. His primary doctor did send him to see Dr. [name] of interventional cardiology. He underwent an angiogram on 08/06/17 and he underwent a PTA and directional atherectomy of the lesser distal SFA and popliteal arteries which resulted in brisk improvement in blood flow. It was noted that he had 2 vessel runoff through the anterior tibial and peroneal. He is also been to see vascular and interventional radiologist. He was not felt to have any significant superficial venous insufficiency. Presumably is not a candidate for any ablation. It was suggested he come here for wound care. The patient is a type II diabetic on insulin. He also has a history of venous insufficiency. ABIs on the left were noncompressible in our clinic 10/21/17; patient we admitted to the clinic last week. 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 We put Iodosorb on him after an aggressive debridement and 3 layer compression. He complained of pain in his ankle and itching with is skin in  fact he scratched the area on the medial calf superiorly at the rim of our wraps and he has 2 small open areas in that location today which are new. I changed his primary dressing today to silver collagen. As noted he is already had revascularization and does not have any significant superficial venous insufficiency that would be amenable to ablation 10/28/17; patient admitted to the clinic 2 weeks ago. He has a smaller Wound. Scratch injury from last week revealed. There is large wound over the tibial area. This is smaller. Granulation looks healthy. No need for  debridement. 11/04/17; the wound on the left lateral calf looks better. Improved dimensions. Surface of this looks better. We've been maintaining him and Kerlix Coban wraps. He finds this much more comfortable. Silver collagen dressing 11/11/17; left lateral Wound continues to look healthy be making progress. Using a #5 curet I removed removed nonviable skin from the surface of the wound and then necrotic debris from the wound surface. Surface of the wound continues to look healthy. ooHe also has an open area on the left great toenail bed. We've been using topical antibiotics. 11/19/17; left anterior lateral wound continues to look healthy but it's not closed. ooHe also had a small wound above this on the left leg ooInitially traumatic wounds in the setting of significant chronic venous insufficiency and stasis dermatitis 11/25/17; left anterior wounds superiorly is closed still a small wound inferiorly. 12/02/17; left anterior tibial area. Arrives today with adherent callus. Post debridement clearly not completely closed. Hydrofera Blue under 3 layer compression. 12/09/17; left anterior tibia. Circumferential eschar however the wound bed looks stable to improved. We've been using Hydrofera Blue under 3 layer compression 12/17/17; left anterior tibia. Apparently this was felt to be closed however when the wrap was taken off there is a skin tear to reopen wounds in the same area we've been using Hydrofera Blue under 3 layer compression 12/23/17 left anterior tibia. Not close to close this week apparently the [facility] was stuck to this again. Still circumferential eschar requiring debridement. I put a contact layer on this this time under the Hydrofera Blue 12/31/17; left anterior tibia. Wound is better slight amount of hyper-granulation. Using Hydrofera Blue over Adaptic. 01/07/18; left anterior tibia. The wound had some surface eschar however after this was removed he has no open wound.he was already  revascularized by Dr. [name] when he came to our clinic with atherectomy of the left SFA and popliteal artery. He was also sent to interventional radiology for venous reflux studies. He was not felt to have significant reflux but certainly has chronic venous changes of his skin with hemosiderin deposition around this area. He will definitely need to lubricate his skin and wear compression stocking and I've talked to him about this. READMISSION 05/26/2018 This is a now 70 year old man we cared for with traumatic wounds on his left anterior lower extremity. He had been previously revascularized during that admission by Dr. [name]. Apparently in follow-up Dr. [name] noted that he had deterioration in his arterial status. He underwent a stent placement in the distal left SFA on 04/22/2018. 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 He went back to the angiography suite on 04/30/2018 he underwent PTA and balloon angioplasty of the occluded left mid anterior tibial artery, thrombotic occlusion went from 100 to 0% which reconstitutes the posterior tibial artery. He had thrombectomy and aspiration of the peroneal artery. The stent placed in the distal SFA left SFA was still occluded. He was discharged  on Xarelto, it was noted on the discharge summary from this hospitalization that he had gangrene at the tip of his left fifth toe and there were expectations this would auto amputate. 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 He has been recuperating at [facility] in [facility] after the most recent hospitalization. He is going home tomorrow. He tells me that 2 weeks ago he traumatized the tip of his left fifth toe. He came in urgently for our review of this. This was a history of before I noted that Dr. [name] had already noted dry gangrenous changes of the left fifth toe 06/09/2018; 2-week follow-up. I did contact Dr. [name] after his last  appointment and he apparently saw 1 of Dr. Irven Shelling colleagues the next day. He does not follow-up with Dr. [name] himself until Thursday of this week. 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 Nevertheless there is no evidence of infection no drainage and no pain. He had a new area that this week when we were signing him in today on the left anterior mid tibia area, this is in close proximity to the previous wound we have dealt with in this clinic. 06/23/2018; 2-week follow-up. I did not receive a recent note from Dr. [name] to review today. Our office is trying to obtain this. He is apparently not planning to do further vascular interventions and wondered about compression to try and help with the patient's chronic venous insufficiency. However we are also concerned about the arterial flow. ooHe arrives in clinic today with a new area on the left third toe. The areas on the calf/anterior tibia are close to closing. The left fifth toe is still mummified using Betadine. -In reviewing things with the patient he has what sounds like claudication with mild to moderate amount of activity. 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 I prescribed Levaquin over the phone while we attempted to arrange a plan of care. However the patient called yesterday to report he had low-grade fever and he came in today acutely. There is been a marked deterioration in the left third toe with spreading cellulitis up into the dorsal left foot. He was referred to the emergency room. Readmission: 06/29/2020 patient presents today for reevaluation here in our clinic he was previously treated by Dr. Dellia Nims at the latter part of 2019 in 2 the beginning of 2020. Subsequently we have not seen him since that time in the interim he did have evaluation with vein and vascular specialist specifically Dr. Anice Paganini who did perform quite extensive work for a left femoral to anterior  tibial artery bypass. With that being said in the interim the patient has developed significant lymphedema and has wounds that he tells me have really never healed in regard to the incision site on the left leg. He also has multiple wounds on the feet for various reasons some of which is that he tends to pick at his feet. Fortunately there is no signs of active infection systemically at this time he does have some wounds that are little bit deeper but most are fairly superficial he seems to have good blood flow and overall everything appears to be healthy I see no bone exposed and no obvious signs of osteomyelitis. I do not know that he necessarily needs a x-ray at this point although that something we could consider depending on how  things progress. The patient does have a history of lymphedema, diabetes, this is type II, chronic kidney disease stage III, hypertension, and history of peripheral vascular disease. 07/05/2020; patient admitted last week. Is a patient I remember from 2019 he had a spreading infection involving the left foot and we sent him to the hospital. 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 He subsequently had a left femoral to anterior tibial bypass by Dr.Cain vein and vascular. He also has severe lymphedema with chronic skin changes related to that on the left leg. The most problematic area that was new today was on the left medial great toe. This was apparently a small area last week there was purulent drainage which our intake nurse cultured. Also areas on the left medial foot and heel left lateral foot. He has 2 areas on the left medial calf left lateral calf in the setting of the severe lymphedema. 07/13/2020 on evaluation today patient appears to be doing better in my opinion compared to his last visit. The good news is there is no signs of active infection systemically and locally I do not see any signs of infection either. He did have  an x-ray which was negative that is great news he had a culture which showed MRSA but at the same time he is been on the doxycycline which has helped. I do think we may want to extend this for 7 additional days 1/25; patient admitted to the clinic a few weeks ago. 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 We have been putting him under compression his edema control is a lot better but he is severe verricused skin on the left leg. He is really done quite well he still has an open area on the left medial calf and the left medial first metatarsal head. We have been using silver collagen on the leg silver alginate on the foot 07/27/2020 upon evaluation today patient appears to be doing decently well in regard to his wounds. He still has a lot of dry skin on the left leg. Some of this is starting to peel back and I think he may be able to have them out by removing some that today. Fortunately there is no signs of active infection at this time on the left leg although on the right leg he does appear to have swelling and erythema as well as some mild warmth to touch. This does have been concerned about the possibility of cellulitis although within the differential diagnosis I do think that potentially a DVT has to be at least considered. We need to rule that out before proceeding would just call in the cellulitis. Especially since he is having pain in the posterior aspect of his calf muscle. 2/8; the patient had seen sparingly. He has severe skin changes of chronic lymphedema in the left leg thickened hyperkeratotic verrucous skin. He has an open wound on the medial part of the left first met head left mid tibia. He also has a rim of nonepithelialized skin in the anterior mid tibia. He brought in the AmLactin lotion that was been prescribed although I am not sure under compression and its utility. There concern about cellulitis on the right lower leg the last time he  was here. He was put on on antibiotics. His DVT rule out was negative. The right leg looks fine he is using his stocking on this area 08/10/2020 upon evaluation  today patient appears to be doing well with regard to his leg currently. He has been tolerating the dressing changes without complication. Fortunately there is no signs of active infection which is great news. Overall very pleased with where things stand. 2/22; the patient still has an area on the medial part of the left first met his head. This looks better than when I last saw this earlier this month he has a rim of epithelialization but still some surface debris. Mostly everything on the left leg is healed. There is still a vulnerable in the left mid tibia area. 08/30/2020 upon evaluation today patient appears to be doing much better in regard to his wounds on his foot. 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 Nonetheless I think we will address that today I Minna send in a prescription for him in that regard. Overall though there does not appear to be any signs of significant worsening. 09/07/2020 on evaluation today patient's wounds over his left foot appear to be doing excellent. I do not see any signs of infection there is some callus buildup this can require debridement for certain but overall I feel like he is managing quite nicely. He still using the AmLactin cream which has been beneficial for him as well. 3/22; left foot wound is closed. There is no open area here. He is using ammonium lactate lotion to the lower extremities to help exfoliate dry cracked skin. He has compression stockings from elastic therapy in . The wound on the medial part of his left first met head is healed today. READMISSION 04/12/2021 [name] is a patient we know fairly well he had a prolonged stay in clinic in  2019 with wounds on his left lateral and left anterior lower extremity in the setting of chronic venous insufficiency. More recently he was here earlier this year with predominantly an area on his left foot first metatarsal head plantar and he says the plantar foot broke down on its not long after we discharged him but he did not come back here. The last few months areas of broken down on his left anterior and again the left lateral lower extremity. The leg itself is very swollen chronically enlarged a lot of hyperkeratotic dry Berry Q skin in the left lower leg. His edema extends well into the thigh. He was seen by Dr. Donzetta Matters. He had ABIs on 03/02/2021 showing an ABI on the right of 1 with a TBI of 0.72 his ABI in the left at 1.09 TBI of 0.99. Monophasic and biphasic waveforms on the right. On the left monophasic waveforms were noted he went on to have an angiogram on 03/27/2021 this showed the aortic aortic and iliac segments were free of flow-limiting stenosis the left common femoral vein to evaluate the left femoral to anterior tibial artery bypass was unobstructed the bypass was patent without any areas of stenosis. We discharged the patient in bilateral juxta lite stockings but very clearly that was not sufficient to control the swelling and maintain skin integrity. He is clearly going to need compression pumps. 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 This is fortunate because he is on his feet for long periods of time. 10/27; patient comes in with our intake nurse reporting copious amount of green drainage  from the left anterior mid tibia the left dorsal foot and to a lesser extent the left medial mid tibia. We left the compression wrap on all week for the amount of edema in his left leg is quite a bit better. We use silver alginate as the primary dressing Objective Constitutional Sitting or standing Blood Pressure is within target range for  patient.. Pulse regular and within target range for patient.Marland Kitchen Respirations regular, non-labored and within target range.. Temperature is normal and within the target range for the patient.Marland Kitchen Appears in no distress. Vitals Time Taken: 9:17 AM, Height: 74 in, Weight: 238 lbs, BMI: 30.6, Temperature: 98.2 F, Pulse: 67 bpm, Respiratory Rate: 16 breaths/min, Blood Pressure: 129/68 mmHg, Capillary Blood Glucose: 138 mg/dl. General Notes: Wound exam; left lateral leg wound completely covered in a thick gelatinous green surface. I used a #5 curette to remove this and was able to get to a decent granulation but a deeper wound. I then used another 5 curette to obtain a specimen for deep tissue culture unfortunately unroofed a venous bleeder requiring silver nitrate and Gelfoam and a pressure dressing. oo The area on the left first metatarsal head plantar does not look particularly bad although drainage per our intake staff. I have very gritty surface debrided with a #5 curette similar fashion to the left medial mid tibial wound Integumentary (Hair, Skin) Wound #18 status is Open. Original cause of wound was Gradually Appeared. The date acquired was: 08/23/2020. The wound has been in treatment 1 weeks. The wound is located on the Left,Plantar Metatarsal head first. The wound measures 4.4cm length x 4.5cm width x 0.3cm depth; 15.551cm^2 area and 4.665cm^3 volume. There is Fat Layer (Subcutaneous Tissue) exposed. There is no tunneling or undermining noted. There is a large amount of purulent drainage noted. Foul odor after cleansing was noted. The wound margin is distinct with the outline attached to the wound base. There is large (67-100%) red, pink granulation within the wound bed. There is no necrotic tissue within the wound bed. Wound #19 status is Open. Original cause of wound was Gradually Appeared. The date acquired was: 08/23/2020. The wound has been in treatment 1 weeks. The wound is located on the  Left,Anterior Lower Leg. The wound measures 4.7cm length x 4.5cm width x 0.3cm depth; 16.611cm^2 area and 4.983cm^3 volume. There is Fat Layer (Subcutaneous Tissue) exposed. There is no tunneling or undermining noted. There is a large amount of purulent drainage noted. Foul odor after cleansing was noted. The wound margin is distinct with the outline attached to the wound base. There is no granulation within the wound bed. There is a large (67-100%) amount of necrotic tissue within the wound bed including Adherent Slough. Wound #20 status is Healed - Epithelialized. Original cause of wound was Gradually Appeared. The date acquired was: 08/23/2020. The wound has been in treatment 1 weeks. The wound is located on the [facility] Lower Leg. The wound measures 0cm length x 0cm width x 0cm depth; 0cm^2 area and 0cm^3 volume. There is Fat Layer (Subcutaneous Tissue) exposed. There is no tunneling or undermining noted. There is a none present amount of drainage noted. The wound margin is distinct with the outline attached to the wound base. There is no granulation within the wound bed. There is no necrotic tissue within the wound bed. Wound #21 status is Open. Original cause of wound was Gradually Appeared. The date acquired was: 08/23/2020. The wound has been in treatment 1 weeks. The wound is located on the  Left,Medial Lower Leg. The wound measures 2cm length x 1.3cm width x 0.5cm depth; 2.042cm^2 area and 1.021cm^3 volume. There is Fat Layer (Subcutaneous Tissue) exposed. There is no tunneling or undermining noted. There is a large amount of purulent drainage noted. Foul odor after cleansing was noted. The wound margin is distinct with the outline attached to the wound base. There is small (1-33%) red, pink granulation within the wound bed. There is a large (67-100%) amount of necrotic tissue within the wound bed including Adherent Slough. 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 There was a Excisional Skin/Subcutaneous Tissue Debridement with a total area of 19.8 sq cm performed by Ricard Dillon., MD. With the following instrument(s): Curette to remove Viable and Non-Viable tissue/material. Material removed includes Subcutaneous Tissue, Slough, Skin: Dermis, Skin: Epidermis, and Fibrin/Exudate after achieving pain control using Lidocaine 5% topical ointment. A time out was conducted at 09:45, prior to the start of the procedure. A Moderate amount of bleeding was controlled with Pressure. The procedure was tolerated well with a pain level of 0 throughout and a pain level of 0 following the procedure. Post Debridement Measurements: 4.4cm length x 4.5cm width x 0.3cm depth; 4.665cm^3 volume. Character of Wound/Ulcer Post Debridement is improved. Severity of Tissue Post Debridement is: Fat layer exposed. Post procedure Diagnosis Wound #18: Same as Pre-Procedure Pre-procedure diagnosis of Wound #18 is a Diabetic Wound/Ulcer of the Lower Extremity located on the Left,Plantar Metatarsal head first . There was a Double Layer Compression Therapy Procedure by Deon Pilling, RN. Post procedure Diagnosis Wound #18: Same as Pre-Procedure Wound #19 Pre-procedure diagnosis of Wound #19 is a Diabetic Wound/Ulcer of the Lower Extremity  located on the Left,Anterior Lower Leg .Severity of Tissue Pre Debridement is: Fat layer exposed. There was a Excisional Skin/Subcutaneous Tissue Debridement with a total area of 21.15 sq cm performed by Ricard Dillon., MD. With the following instrument(s): Curette to remove Viable and Non-Viable tissue/material. Material removed includes Subcutaneous Tissue, Slough, Skin: Dermis, Skin: Epidermis, and Fibrin/Exudate after achieving pain control using Lidocaine 5% topical ointment. 1 specimen was taken by a Tissue Culture and sent to the lab per facility protocol. A time out was conducted at 09:45, prior to the start of the procedure. A Moderate amount of bleeding was controlled with Gel Foam. The procedure was tolerated well with a pain level of 0 throughout and a pain level of 0 following the procedure. Post Debridement Measurements: 4.7cm length x 4.5cm width x 0.3cm depth; 4.983cm^3 volume. Character of Wound/Ulcer Post Debridement is improved. Severity of Tissue Post Debridement is: Fat layer exposed. Post procedure Diagnosis Wound #19: Same as Pre-Procedure Pre-procedure diagnosis of Wound #19 is a Diabetic Wound/Ulcer of the Lower Extremity located on the Left,Anterior Lower Leg . There was a Double Layer Compression Therapy Procedure by Deon Pilling, RN. Post procedure Diagnosis Wound #19: Same as Pre-Procedure Wound #21 Pre-procedure diagnosis of Wound #21 is a Diabetic Wound/Ulcer of the  Lower Extremity located on the Left,Medial Lower Leg .Severity of Tissue Pre Debridement is: Fat layer exposed. There was a Excisional Skin/Subcutaneous Tissue Debridement with a total area of 2.6 sq cm performed by Ricard Dillon., MD. With the following instrument(s): Curette to remove Viable and Non-Viable tissue/material. Material removed includes Subcutaneous Tissue, Slough, Skin: Dermis, Skin: Epidermis, and Fibrin/Exudate after achieving pain control using Lidocaine 5% topical ointment. A time  out was conducted at 09:45, prior to the start of the procedure. A Moderate amount of bleeding was controlled with Pressure. The procedure was tolerated well with a pain level of 0 throughout and a pain level of 0 following the procedure. Post Debridement Measurements: 2cm length x 1.3cm width x 0.5cm depth; 1.021cm^3 volume. Character of Wound/Ulcer Post Debridement is improved. Severity of Tissue Post Debridement is: Fat layer exposed. Post procedure Diagnosis Wound #21: Same as Pre-Procedure Pre-procedure diagnosis of Wound #21 is a Diabetic Wound/Ulcer of the Lower Extremity located on the Left,Medial Lower Leg . There was a Double Layer Compression Therapy Procedure by Deon Pilling, RN. Post procedure Diagnosis Wound #21: Same as Pre-Procedure Plan Follow-up Appointments: Return Appointment in 1 week. - Dr. Dellia Nims Thursday Nurse Visit: - Monday 04/24/2021 Bathing/ Shower/ Hygiene: May shower with protection but do not get wound dressing(s) wet. - Use a cast protector so you can shower without getting your wrap(s) wet Edema Control - Lymphedema / SCD / Other: Elevate legs to the level of the heart or above for 30 minutes daily and/or when sitting, a frequency of: Avoid standing for long periods of time. Patient to wear own compression stockings every day. - on right leg; UNNABOOT TO LLE!!!! Moisturize legs daily. - Ammonium LACTATE to BLE every day. Laboratory ordered were: Areobic culture-specimen not specified - deep tissue culture of left anterior lower leg wound. WOUND #18: - Metatarsal head first Wound Laterality: Plantar, Left Cleanser: Normal Saline 1 x Per Week/7 Days Discharge Instructions: Cleanse the wound with Normal Saline prior to applying a clean dressing using gauze sponges, not tissue or cotton balls. Cleanser: Soap and Water 1 x Per Week/7 Days Discharge Instructions: May shower and wash wound with dial antibacterial soap and water prior to dressing change. Cleanser:  Wound Cleanser 1 x Per Week/7 Days Discharge Instructions: Cleanse the wound with wound cleanser prior to applying a clean dressing using gauze sponges, not tissue or cotton balls. Peri-Wound Care: Triamcinolone 15 (g) 1 x Per Week/7 Days Discharge Instructions: Use triamcinolone 15 (g) as directed Peri-Wound Care: Zinc Oxide Ointment 30g tube 1 x Per Week/7 Days Discharge Instructions: Apply Zinc Oxide to periwound with each dressing change Peri-Wound Care: Sween Lotion (Moisturizing lotion) 1 x Per Week/7 Days Discharge Instructions: Apply moisturizing lotion as directed Topical: Gentamicin 1 x Per Week/7 Days Discharge Instructions: As directed by physician Prim Dressing: KerraCel Ag Gelling Fiber Dressing, 4x5 in (silver alginate) 1 x Per Week/7 Days ary Discharge Instructions: Apply silver alginate to wound bed as instructed Secondary Dressing: ABD Pad, 5x9 1 x Per Week/7 Days Discharge Instructions: Apply over primary dressing as directed. Secondary Dressing: Zetuvit Plus 4x8 in 1 x Per Week/7 Days Discharge Instructions: Apply over primary dressing as directed. Secondary Dressing: CarboFLEX Odor Control Dressing, 4x4 in 1 x Per Week/7 Days Discharge Instructions: Apply over primary dressing as directed. Com pression Wrap: Unnaboot w/Calamine, 4x10 (in/yd) 1 x Per Week/7 Days Discharge Instructions: Apply Unnaboot as directed. May also use Miliken CoFlex Calamine 2 layer compression system as alternative. WOUND #19: -  Lower Leg Wound Laterality: Left, Anterior Cleanser: Normal Saline 1 x Per Week/7 Days Discharge Instructions: Cleanse the wound with Normal Saline prior to applying a clean dressing using gauze sponges, not tissue or cotton balls. Cleanser: Soap and Water 1 x Per Week/7 Days Discharge Instructions: May shower and wash wound with dial antibacterial soap and water prior to dressing change. Cleanser: Wound Cleanser 1 x Per Week/7 Days Discharge Instructions: Cleanse the  wound with wound cleanser prior to applying a clean dressing using gauze sponges, not tissue or cotton balls. Peri-Wound Care: Triamcinolone 15 (g) 1 x Per Week/7 Days Discharge Instructions: Use triamcinolone 15 (g) as directed Peri-Wound Care: Sween Lotion (Moisturizing lotion) 1 x Per Week/7 Days Discharge Instructions: Apply moisturizing lotion as directed Topical: Gentamicin 1 x Per Week/7 Days Discharge Instructions: As directed by physician Prim Dressing: KerraCel Ag Gelling Fiber Dressing, 4x5 in (silver alginate) 1 x Per Week/7 Days ary Discharge Instructions: Apply silver alginate to wound bed as instructed Secondary Dressing: ABD Pad, 5x9 1 x Per Week/7 Days Discharge Instructions: Apply over primary dressing as directed. Secondary Dressing: Zetuvit Plus 4x8 in 1 x Per Week/7 Days Discharge Instructions: Apply over primary dressing as directed. Secondary Dressing: CarboFLEX Odor Control Dressing, 4x4 in 1 x Per Week/7 Days Discharge Instructions: Apply over primary dressing as directed. Com pression Wrap: Unnaboot w/Calamine, 4x10 (in/yd) 1 x Per Week/7 Days Discharge Instructions: Apply Unnaboot as directed. May also use Miliken CoFlex Calamine 2 layer compression system as alternative. WOUND #21: - Lower Leg Wound Laterality: Left, Medial Cleanser: Normal Saline 1 x Per Week/7 Days Discharge Instructions: Cleanse the wound with Normal Saline prior to applying a clean dressing using gauze sponges, not tissue or cotton balls. Cleanser: Soap and Water 1 x Per Week/7 Days Discharge Instructions: May shower and wash wound with dial antibacterial soap and water prior to dressing change. Cleanser: Wound Cleanser 1 x Per Week/7 Days Discharge Instructions: Cleanse the wound with wound cleanser prior to applying a clean dressing using gauze sponges, not tissue or cotton balls. Peri-Wound Care: Triamcinolone 15 (g) 1 x Per Week/7 Days Discharge Instructions: Use triamcinolone 15 (g) as  directed Peri-Wound Care: Sween Lotion (Moisturizing lotion) 1 x Per Week/7 Days Discharge Instructions: Apply moisturizing lotion as directed Topical: Gentamicin 1 x Per Week/7 Days Discharge Instructions: As directed by physician Prim Dressing: KerraCel Ag Gelling Fiber Dressing, 4x5 in (silver alginate) 1 x Per Week/7 Days ary Discharge Instructions: Apply silver alginate to wound bed as instructed Secondary Dressing: ABD Pad, 5x9 1 x Per Week/7 Days Discharge Instructions: Apply over primary dressing as directed. Secondary Dressing: Zetuvit Plus 4x8 in 1 x Per Week/7 Days Discharge Instructions: Apply over primary dressing as directed. Secondary Dressing: CarboFLEX Odor Control Dressing, 4x4 in 1 x Per Week/7 Days Discharge Instructions: Apply over primary dressing as directed. Com pression Wrap: Unnaboot w/Calamine, 4x10 (in/yd) 1 x Per Week/7 Days Discharge Instructions: Apply Unnaboot as directed. May also use Miliken CoFlex Calamine 2 layer compression system as alternative. #1 debridement as noted. I have added gentamicin empirically under the silver alginate with 4-layer compression 2. We will bring him back midweek for a nurse visit to change the dressing 3. Await the deep tissue culture we did from the left anterior lower leg before considering systemic antibiotics or alteration of the topical antibiotics 4. He has severe lymphedema which necessitates 4-layer compression fortunately he is tolerating this quite well. He is retiring on November 15 and I look forward to a  time where we can get him off this leg as much he is currently. Electronic Signature(s) Signed: 04/25/2021 4:29:54 PM By: Linton Ham MD Signed: 04/25/2021 4:29:54 PM By: Linton Ham MD Entered By: Linton Ham on 04/20/2021 10:04:48 -------------------------------------------------------------------------------- SuperBill Details Patient Name: Date of Service: [name] Garfinkel 04/20/2021 Medical  Record Number: [id number] Patient Account Number: [id number] Date of Birth/Sex: Treating RN: 16-Feb-1951 (70 y.o. [name] Primary Care Provider: [name] Other Clinician: Referring Provider: Treating Provider/Extender: Stormy Card in Treatment: 1 Diagnosis Coding ICD-10 Codes Code Description [phone] Type 2 diabetes mellitus with foot ulcer 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 I89.0 Lymphedema, not elsewhere classified I87.322 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verity 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 of other part of left foot with other specified severity E11.42 Type 2 diabetes mellitus with diabetic polyneuropathy Facility Procedures CPT4 Code: [id number] Description: 37096 - DEB SUBQ TISSUE 20 SQ CM/< ICD-10 Diagnosis Description 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 ulcer of other part of left lower leg with other specified 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 of other part of left foot with other specified sever Modifier: severity ity Quantity: 1 CPT4 Code: [id number] Description: 03754 - DEB SUBQ TISS EA ADDL 20CM ICD-10 Diagnosis Description 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 of other part of left foot with other specified sever 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 ulcer of other part of left lower leg with other specified Modifier: ity severity Quantity: 2 Physician Procedures : CPT4 Code Description Modifier [id number] 11042 - WC PHYS SUBQ TISS 20 SQ CM ICD-10 Diagnosis Description 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verity 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 of other part of  left foot with other specified severity Quantity: 1 : [id number] 81859 - WC PHYS SUBQ TISS EA ADDL 20 CM ICD-10 Diagnosis Description L97.528 Non-pressure  chronic ulcer of other part of left foot with other specified severity 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 ulcer of other part of left lower leg with other  specified severity Quantity: 2 Electronic Signature(s) Signed: 04/25/2021 4:29:54 PM By: Linton Ham MD Entered By: Linton Ham on 04/20/2021 10:05:23

## 2021-04-25 NOTE — Progress Notes (Signed)
[name], [name] ([id number]) Visit Report for 04/24/2021 Arrival Information Details Patient Name: Date of Service: [name], [name] 04/24/2021 9:00 A M Medical Record Number: [id number] Patient Account Number: [id number] Date of Birth/Sex: Treating RN: 1951/03/23 (70 y.o. [name] Primary Care Cadel Stairs: [name] Other Clinician: Referring Aden Sek: Treating Padraic Marinos/Extender: Cornelious Bryant in Treatment: 1 Visit Information History Since Last Visit Added or deleted any medications: No Patient Arrived: Ambulatory Any new allergies or adverse reactions: No Arrival Time: 09:07 Had a fall or experienced change in No Accompanied By: self activities of daily living that may affect Transfer Assistance: None risk of falls: Patient Identification Verified: Yes Signs or symptoms of abuse/neglect since No Secondary Verification Process Completed: Yes last visito Patient Requires Transmission-Based Precautions: No Hospitalized since last visit: No Patient Has Alerts: Yes Implantable device outside of the clinic No Patient Alerts: ABI's: 09/22 L:1.09 R: 1. excluding TBI's: R:0.72 L:0.99 cellular tissue based products placed in the center since last visit: Has Dressing in Place as Prescribed: Yes Has Compression in Place as Prescribed: Yes Has Footwear/Offloading in Place as Yes Prescribed: Left: Surgical Shoe with Pressure Relief Insole Pain Present Now: Yes Electronic Signature(s) Signed: 04/25/2021 5:06:28 PM By: Dellie Catholic RN Entered By: Dellie Catholic on 04/24/2021 09:15:41 -------------------------------------------------------------------------------- Compression Therapy Details Patient Name: Date of Service: [name]. 04/24/2021 9:00 A M Medical Record Number: [id number] Patient Account Number: [id number] Date of Birth/Sex: Treating RN: 06-15-1951 (70 y.o. [name] Primary Care Ander Wamser: [name] Other  Clinician: Referring Traylen Eckels: Treating Tajh Livsey/Extender: Cornelious Bryant in Treatment: 1 Compression Therapy Performed for Wound Assessment: Wound #19 Left,Anterior Lower Leg Performed By: Clinician Baruch Gouty, RN Compression Type: Rolena Infante Electronic Signature(s) Signed: 04/25/2021 4:07:03 PM By: Baruch Gouty RN, BSN Entered By: Baruch Gouty on 04/24/2021 10:39:25 -------------------------------------------------------------------------------- Compression Therapy Details Patient Name: Date of Service: [name]. 04/24/2021 9:00 A M Medical Record Number: [id number] Patient Account Number: [id number] Date of Birth/Sex: Treating RN: 28-Jan-1951 (70 y.o. [name] Primary Care Janne Faulk: [name] Other Clinician: Referring Shelbi Vaccaro: Treating Emir Nack/Extender: Cornelious Bryant in Treatment: 1 Compression Therapy Performed for Wound Assessment: Wound #18 Left,Plantar Metatarsal head first Performed By: Clinician Baruch Gouty, RN Compression Type: Rolena Infante Electronic Signature(s) Signed: 04/25/2021 4:07:03 PM By: Baruch Gouty RN, BSN Entered By: Baruch Gouty on 04/24/2021 10:39:25 -------------------------------------------------------------------------------- Compression Therapy Details Patient Name: Date of Service: [name]. 04/24/2021 9:00 A M Medical Record Number: [id number] Patient Account Number: [id number] Date of Birth/Sex: Treating RN: [birth year] (70 y.o. [name] Primary Care Elisa Kutner: [name] Other Clinician: Referring Roan Miklos: Treating Hasaan Radde/Extender: Cornelious Bryant in Treatment: 1 Compression Therapy Performed for Wound Assessment: Wound #21 Left,Medial Lower Leg Performed By: Clinician Baruch Gouty, RN Compression Type: Rolena Infante Electronic Signature(s) Signed: 04/25/2021 4:07:03 PM By: Baruch Gouty RN, BSN Entered By: Baruch Gouty on 04/24/2021 10:39:25 -------------------------------------------------------------------------------- Encounter Discharge Information Details Patient Name: Date of Service: [name]. 04/24/2021 9:00 A M Medical Record Number: [id number] Patient Account Number: [id number] Date of Birth/Sex: Treating RN: November 25, 1950 (70 y.o. [name] Primary Care Chanceler Pullin: [name] Other Clinician: Referring Wende Longstreth: Treating Fabrizzio Marcella/Extender: Cornelious Bryant in Treatment: 1 Encounter Discharge Information Items Discharge Condition: Stable Ambulatory Status: Ambulatory Discharge Destination: Home Transportation: Private Auto Accompanied By: self Schedule Follow-up Appointment: Yes Clinical Summary of Care: Patient Declined Electronic Signature(s) Signed: 04/25/2021 4:07:03 PM By: Baruch Gouty  RN, BSN Entered By: Baruch Gouty on 04/24/2021 10:40:25 -------------------------------------------------------------------------------- Patient/Caregiver Education Details Patient Name: Date of Service: [name], [name] 10/31/2022andnbsp9:00 A M Medical Record Number: [id number] Patient Account Number: [id number] Date of Birth/Gender: Treating RN: 1951/05/22 (70 y.o. [name] Primary Care Physician: [name] Other Clinician: Referring Physician: Treating Physician/Extender: Cornelious Bryant in Treatment: 1 Education Assessment Education Provided To: Patient Education Topics Provided Venous: Methods: Explain/Verbal Responses: Reinforcements needed, State content correctly Wound/Skin Impairment: Methods: Explain/Verbal Responses: Reinforcements needed, State content correctly Electronic Signature(s) Signed: 04/25/2021 4:07:03 PM By: Baruch Gouty RN, BSN Entered By: Baruch Gouty on 04/24/2021 10:40:12 -------------------------------------------------------------------------------- Wound Assessment  Details Patient Name: Date of Service: [name]. 04/24/2021 9:00 A M Medical Record Number: [id number] Patient Account Number: [id number] Date of Birth/Sex: Treating RN: [birth year] (70 y.o. [name] Primary Care Emri Sample: [name] Other Clinician: Referring Kourtnee Lahey: Treating Daphanie Oquendo/Extender: Cornelious Bryant in Treatment: 1 Wound Status Wound Number: 18 Primary Diabetic Wound/Ulcer of the Lower Extremity Etiology: Wound Location: Left, Plantar Metatarsal head first Wound Open Wounding Event: Gradually Appeared Status: Date Acquired: 08/23/2020 Comorbid Glaucoma, Sleep Apnea, Hypertension, Peripheral Arterial Disease, Weeks Of Treatment: 1 History: Peripheral Venous Disease, Type II Diabetes, Gout, Osteoarthritis, Clustered Wound: No Neuropathy Wound Measurements Length: (cm) 4.2 Width: (cm) 4.6 Depth: (cm) 0.3 Area: (cm) 15.174 Volume: (cm) 4.552 % Reduction in Area: -7.3% % Reduction in Volume: -61% Epithelialization: Small (1-33%) Tunneling: No Undermining: No Wound Description Classification: Grade 2 Wound Margin: Distinct, outline attached Exudate Amount: Large Exudate Type: Purulent Exudate Color: yellow, brown, green Foul Odor After Cleansing: Yes Due to Product Use: No Slough/Fibrino No Wound Bed Granulation Amount: Large (67-100%) Exposed Structure Granulation Quality: Red, Pink Fascia Exposed: No Necrotic Amount: Small (1-33%) Fat Layer (Subcutaneous Tissue) Exposed: Yes Necrotic Quality: Adherent Slough Tendon Exposed: No Muscle Exposed: No Joint Exposed: No Bone Exposed: No Treatment Notes Wound #18 (Metatarsal head first) Wound Laterality: Plantar, Left Cleanser Normal Saline Discharge Instruction: Cleanse the wound with Normal Saline prior to applying a clean dressing using gauze sponges, not tissue or cotton balls. Soap and Water Discharge Instruction: May shower and wash wound with dial  antibacterial soap and water prior to dressing change. Wound Cleanser Discharge Instruction: Cleanse the wound with wound cleanser prior to applying a clean dressing using gauze sponges, not tissue or cotton balls. Peri-Wound Care Triamcinolone 15 (g) Discharge Instruction: Use triamcinolone 15 (g) as directed Zinc Oxide Ointment 30g tube Discharge Instruction: Apply Zinc Oxide to periwound with each dressing change Sween Lotion (Moisturizing lotion) Discharge Instruction: Apply moisturizing lotion as directed Topical Gentamicin Discharge Instruction: As directed by physician Primary Dressing KerraCel Ag Gelling Fiber Dressing, 4x5 in (silver alginate) Discharge Instruction: Apply silver alginate to wound bed as instructed Secondary Dressing ABD Pad, 5x9 Discharge Instruction: Apply over primary dressing as directed. Zetuvit Plus 4x8 in Discharge Instruction: Apply over primary dressing as directed. CarboFLEX Odor Control Dressing, 4x4 in Discharge Instruction: Apply over primary dressing as directed. Secured With Compression Wrap Unnaboot w/Calamine, 4x10 (in/yd) Discharge Instruction: Apply Unnaboot as directed. May also use Miliken CoFlex Calamine 2 layer compression system as alternative. Compression Stockings Add-Ons Electronic Signature(s) Signed: 04/25/2021 4:07:03 PM By: Baruch Gouty RN, BSN Entered By: Baruch Gouty on 04/24/2021 10:37:59 -------------------------------------------------------------------------------- Wound Assessment Details Patient Name: Date of Service: [name]. 04/24/2021 9:00 A M Medical Record Number: [id number] Patient Account Number: [id number] Date of Birth/Sex: Treating RN: [birth year] (70 y.o. [name]  Primary Care Ines Warf: [name] Other Clinician: Referring Karlena Luebke: Treating Nickalaus Crooke/Extender: Cornelious Bryant in Treatment: 1 Wound Status Wound Number: 19 Primary Diabetic Wound/Ulcer of  the Lower Extremity Etiology: Wound Location: Left, Anterior Lower Leg Wound Open Wounding Event: Gradually Appeared Status: Date Acquired: 08/23/2020 Comorbid Glaucoma, Sleep Apnea, Hypertension, Peripheral Arterial Disease, Weeks Of Treatment: 1 History: Peripheral Venous Disease, Type II Diabetes, Gout, Osteoarthritis, Clustered Wound: No Neuropathy Wound Measurements Length: (cm) 4.7 Width: (cm) 4.5 Depth: (cm) 0.3 Area: (cm) 16.611 Volume: (cm) 4.983 % Reduction in Area: -14.9% % Reduction in Volume: -14.9% Epithelialization: None Tunneling: No Undermining: No Wound Description Classification: Grade 2 Wound Margin: Distinct, outline attached Exudate Amount: Large Exudate Type: Purulent Exudate Color: yellow, brown, green Foul Odor After Cleansing: No Slough/Fibrino Yes Wound Bed Granulation Amount: Small (1-33%) Exposed Structure Necrotic Amount: Large (67-100%) Fascia Exposed: No Necrotic Quality: Adherent Slough Fat Layer (Subcutaneous Tissue) Exposed: Yes Tendon Exposed: No Muscle Exposed: No Joint Exposed: No Bone Exposed: No Treatment Notes Wound #19 (Lower Leg) Wound Laterality: Left, Anterior Cleanser Normal Saline Discharge Instruction: Cleanse the wound with Normal Saline prior to applying a clean dressing using gauze sponges, not tissue or cotton balls. Soap and Water Discharge Instruction: May shower and wash wound with dial antibacterial soap and water prior to dressing change. Wound Cleanser Discharge Instruction: Cleanse the wound with wound cleanser prior to applying a clean dressing using gauze sponges, not tissue or cotton balls. Peri-Wound Care Triamcinolone 15 (g) Discharge Instruction: Use triamcinolone 15 (g) as directed Sween Lotion (Moisturizing lotion) Discharge Instruction: Apply moisturizing lotion as directed Topical Gentamicin Discharge Instruction: As directed by physician Primary Dressing KerraCel Ag Gelling Fiber  Dressing, 4x5 in (silver alginate) Discharge Instruction: Apply silver alginate to wound bed as instructed Secondary Dressing ABD Pad, 5x9 Discharge Instruction: Apply over primary dressing as directed. Zetuvit Plus 4x8 in Discharge Instruction: Apply over primary dressing as directed. CarboFLEX Odor Control Dressing, 4x4 in Discharge Instruction: Apply over primary dressing as directed. Secured With Compression Wrap Unnaboot w/Calamine, 4x10 (in/yd) Discharge Instruction: Apply Unnaboot as directed. May also use Miliken CoFlex Calamine 2 layer compression system as alternative. Compression Stockings Add-Ons Electronic Signature(s) Signed: 04/25/2021 4:07:03 PM By: Baruch Gouty RN, BSN Entered By: Baruch Gouty on 04/24/2021 10:38:42 -------------------------------------------------------------------------------- Wound Assessment Details Patient Name: Date of Service: [name]. 04/24/2021 9:00 A M Medical Record Number: [id number] Patient Account Number: [id number] Date of Birth/Sex: Treating RN: October 17, 1950 (70 y.o. [name] Primary Care Markiesha Delia: [name] Other Clinician: Referring Lorae Roig: Treating Cyrena Kuchenbecker/Extender: Cornelious Bryant in Treatment: 1 Wound Status Wound Number: 21 Primary Diabetic Wound/Ulcer of the Lower Extremity Etiology: Wound Location: Left, Medial Lower Leg Wound Open Wounding Event: Gradually Appeared Status: Date Acquired: 08/23/2020 Comorbid Glaucoma, Sleep Apnea, Hypertension, Peripheral Arterial Disease, Weeks Of Treatment: 1 History: Peripheral Venous Disease, Type II Diabetes, Gout, Osteoarthritis, Clustered Wound: No Neuropathy Wound Measurements Length: (cm) 2 Width: (cm) 1.3 Depth: (cm) 0.5 Area: (cm) 2.042 Volume: (cm) 1.021 % Reduction in Area: 30.7% % Reduction in Volume: -15.5% Epithelialization: None Tunneling: No Undermining: No Wound Description Classification: Grade 2 Wound  Margin: Distinct, outline attached Exudate Amount: Medium Exudate Type: Purulent Exudate Color: yellow, brown, green Foul Odor After Cleansing: Yes Due to Product Use: No Slough/Fibrino Yes Wound Bed Granulation Amount: Medium (34-66%) Exposed Structure Granulation Quality: Red, Pink Fascia Exposed: No Necrotic Amount: Medium (34-66%) Fat Layer (Subcutaneous Tissue) Exposed: Yes Necrotic Quality: Adherent Slough Tendon Exposed: No Muscle  Exposed: No Joint Exposed: No Bone Exposed: No Treatment Notes Wound #21 (Lower Leg) Wound Laterality: Left, Medial Cleanser Normal Saline Discharge Instruction: Cleanse the wound with Normal Saline prior to applying a clean dressing using gauze sponges, not tissue or cotton balls. Soap and Water Discharge Instruction: May shower and wash wound with dial antibacterial soap and water prior to dressing change. Wound Cleanser Discharge Instruction: Cleanse the wound with wound cleanser prior to applying a clean dressing using gauze sponges, not tissue or cotton balls. Peri-Wound Care Triamcinolone 15 (g) Discharge Instruction: Use triamcinolone 15 (g) as directed Sween Lotion (Moisturizing lotion) Discharge Instruction: Apply moisturizing lotion as directed Topical Gentamicin Discharge Instruction: As directed by physician Primary Dressing KerraCel Ag Gelling Fiber Dressing, 4x5 in (silver alginate) Discharge Instruction: Apply silver alginate to wound bed as instructed Secondary Dressing ABD Pad, 5x9 Discharge Instruction: Apply over primary dressing as directed. Zetuvit Plus 4x8 in Discharge Instruction: Apply over primary dressing as directed. CarboFLEX Odor Control Dressing, 4x4 in Discharge Instruction: Apply over primary dressing as directed. Secured With Compression Wrap Unnaboot w/Calamine, 4x10 (in/yd) Discharge Instruction: Apply Unnaboot as directed. May also use Miliken CoFlex Calamine 2 layer compression system as  alternative. Compression Stockings Add-Ons Electronic Signature(s) Signed: 04/25/2021 4:07:03 PM By: Baruch Gouty RN, BSN Entered By: Baruch Gouty on 04/24/2021 10:38:58 -------------------------------------------------------------------------------- Vitals Details Patient Name: Date of Service: [name]. 04/24/2021 9:00 A M Medical Record Number: [id number] Patient Account Number: [id number] Date of Birth/Sex: Treating RN: 06/09/1951 (70 y.o. [name] Primary Care Lasharn Bufkin: [name] Other Clinician: Referring Winner Valeriano: Treating Taite Schoeppner/Extender: Cornelious Bryant in Treatment: 1 Vital Signs Time Taken: 09:15 Temperature (F): 98.4 Height (in): 74 Pulse (bpm): 69 Weight (lbs): 238 Respiratory Rate (breaths/min): 16 Body Mass Index (BMI): 30.6 Blood Pressure (mmHg): 114/71 Reference Range: 80 - 120 mg / dl Electronic Signature(s) Signed: 04/25/2021 5:06:28 PM By: Dellie Catholic RN Entered By: Dellie Catholic on 04/24/2021 09:17:23

## 2021-04-27 ENCOUNTER — Other Ambulatory Visit: Payer: Self-pay

## 2021-04-27 ENCOUNTER — Encounter (HOSPITAL_BASED_OUTPATIENT_CLINIC_OR_DEPARTMENT_OTHER): Payer: BC Managed Care – PPO | Attending: Internal Medicine | Admitting: Internal Medicine

## 2021-04-27 DIAGNOSIS — E11621 Type 2 diabetes mellitus with foot ulcer: Secondary | ICD-10-CM | POA: Diagnosis present

## 2021-04-27 DIAGNOSIS — E1142 Type 2 diabetes mellitus with diabetic polyneuropathy: Secondary | ICD-10-CM | POA: Diagnosis not present

## 2021-04-27 DIAGNOSIS — I87322 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: Secondary | ICD-10-CM | POA: Insufficient documentation

## 2021-04-27 DIAGNOSIS — E1151 Type 2 diabetes mellitus with diabetic peripheral angiopathy without gangrene: Secondary | ICD-10-CM | POA: Insufficient documentation

## 2021-04-27 DIAGNOSIS — I890 Lymphedema, not elsewhere classified: Secondary | ICD-10-CM | POA: Diagnosis not present

## 2021-04-27 DIAGNOSIS — L97828 Non-pressure chronic ulcer of other part of left lower leg with other specified severity: Secondary | ICD-10-CM | POA: Diagnosis not present

## 2021-04-27 DIAGNOSIS — L97528 Non-pressure chronic ulcer of other part of left foot with other specified severity: Secondary | ICD-10-CM | POA: Diagnosis not present

## 2021-04-27 NOTE — Progress Notes (Signed)
[name], [name] ([id number]) Visit Report for 04/27/2021 Arrival Information Details Patient Name: Date of Service: [name], [name] 04/27/2021 8:45 A M Medical Record Number: [id number] Patient Account Number: [id number] Date of Birth/Sex: Treating RN: April 19, 1951 (70 y.o. [name] Primary Care Briar Sword: [name] Other Clinician: Referring Dannah Ryles: Treating Kiyana Vazguez/Extender: Stormy Card in Treatment: 2 Visit Information History Since Last Visit Added or deleted any medications: No Patient Arrived: Ambulatory Any new allergies or adverse reactions: No Arrival Time: 09:15 Had a fall or experienced change in No Accompanied By: self activities of daily living that may affect Transfer Assistance: None risk of falls: Patient Requires Transmission-Based Precautions: No Signs or symptoms of abuse/neglect since No Patient Has Alerts: Yes last visito Patient Alerts: ABI's: 09/22 L:1.09 R: 1. Hospitalized since last visit: No TBI's: R:0.72 L:0.99 Implantable device outside of the clinic No excluding cellular tissue based products placed in the center since last visit: Has Dressing in Place as Prescribed: Yes Has Compression in Place as Prescribed: Yes Has Footwear/Offloading in Place as Yes Prescribed: Left: Surgical Shoe with Pressure Relief Insole Pain Present Now: Yes Electronic Signature(s) Signed: 04/27/2021 5:54:47 PM By: Dellie Catholic RN Entered By: Dellie Catholic on 04/27/2021 09:20:36 -------------------------------------------------------------------------------- Compression Therapy Details Patient Name: Date of Service: [name]. 04/27/2021 8:45 A M Medical Record Number: [id number] Patient Account Number: [id number] Date of Birth/Sex: Treating RN: 07/27/1950 (70 y.o. [name] Primary Care Antwine Agosto: [name] Other Clinician: Referring Kalin Kyler: Treating Maloni Musleh/Extender: Stormy Card in  Treatment: 2 Compression Therapy Performed for Wound Assessment: Wound #19 Left,Anterior Lower Leg Performed By: Clinician Baruch Gouty, RN Compression Type: Rolena Infante Post Procedure Diagnosis Same as Pre-procedure Electronic Signature(s) Signed: 04/27/2021 6:04:58 PM By: Baruch Gouty RN, BSN Entered By: Baruch Gouty on 04/27/2021 10:04:03 -------------------------------------------------------------------------------- Encounter Discharge Information Details Patient Name: Date of Service: [name]. 04/27/2021 8:45 A M Medical Record Number: [id number] Patient Account Number: [id number] Date of Birth/Sex: Treating RN: 23-Aug-1950 (70 y.o. [name] Primary Care Ayushi Pla: [name] Other Clinician: Referring Zaylin Pistilli: Treating Itzel Lowrimore/Extender: Stormy Card in Treatment: 2 Encounter Discharge Information Items Post Procedure Vitals Discharge Condition: Stable Temperature (F): 98.1 Ambulatory Status: Ambulatory Pulse (bpm): 77 Discharge Destination: Home Respiratory Rate (breaths/min): 18 Transportation: Private Auto Blood Pressure (mmHg): 131/84 Accompanied By: self Schedule Follow-up Appointment: Yes Clinical Summary of Care: Patient Declined Electronic Signature(s) Signed: 04/27/2021 6:04:58 PM By: Baruch Gouty RN, BSN Entered By: Baruch Gouty on 04/27/2021 10:34:37 -------------------------------------------------------------------------------- Lower Extremity Assessment Details Patient Name: Date of Service: [name]. 04/27/2021 8:45 A M Medical Record Number: [id number] Patient Account Number: [id number] Date of Birth/Sex: Treating RN: [birth year] (70 y.o. [name] Primary Care Patrick Salemi: [name] Other Clinician: Referring Gaurav Baldree: Treating Jakhi Dishman/Extender: Stormy Card in Treatment: 2 Edema Assessment Assessed: [name]: No] [name]: No] Edema: [Left: Ye] [Right:  s] Calf Left: Right: Point of Measurement: 41 cm From Medial Instep 48 cm Ankle Left: Right: Point of Measurement: 10 cm From Medial Instep 28.5 cm Electronic Signature(s) Signed: 04/27/2021 5:54:47 PM By: Dellie Catholic RN Signed: 04/27/2021 6:11:26 PM By: Deon Pilling RN, BSN Entered By: Dellie Catholic on 04/27/2021 09:39:23 -------------------------------------------------------------------------------- Multi Wound Chart Details Patient Name: Date of Service: [name]. 04/27/2021 8:45 A M Medical Record Number: [id number] Patient Account Number: [id number] Date of Birth/Sex: Treating RN: 1950/12/16 (70 y.o. [name] Primary Care Randen Kauth: [name] Other Clinician: Referring Lynnsey Barbara:  Treating Kedar Sedano/Extender: Stormy Card in Treatment: 2 Vital Signs Height(in): 74 Pulse(bpm): 67 Weight(lbs): 238 Blood Pressure(mmHg): 131/84 Body Mass Index(BMI): 31 Temperature(F): 98.1 Respiratory Rate(breaths/min): 18 Photos: [18:No Photos Left, Plantar Metatarsal head first] [19:No Photos Left, Anterior Lower Leg] [21:No Photos Left, Medial Lower Leg] Wound Location: [18:Gradually Appeared] [19:Gradually Appeared] [21:Gradually Appeared] Wounding Event: [18:Diabetic Wound/Ulcer of the Lower] [19:Diabetic Wound/Ulcer of the Lower] [21:Diabetic Wound/Ulcer of the Lower] Primary Etiology: [18:Extremity Glaucoma, Sleep Apnea,] [19:Extremity Glaucoma, Sleep Apnea,] [21:Extremity Glaucoma, Sleep Apnea,] Comorbid History: [18:Hypertension, Peripheral Arterial Disease, Peripheral Venous Disease, Disease, Peripheral Venous Disease, Disease, Peripheral Venous Disease, Type II Diabetes, Gout, Osteoarthritis, Type II Diabetes, Gout, Osteoarthritis, Type II  Diabetes, Gout, Osteoarthritis, Neuropathy 08/23/2020] [19:Hypertension, Peripheral Arterial Neuropathy 08/23/2020] [21:Hypertension, Peripheral Arterial Neuropathy 08/23/2020] Date Acquired: [18:2] [19:2]  [21:2] Weeks of Treatment: [18:Open] [19:Open] [21:Open] Wound Status: [18:4.2x4.5x0.3] [19:4.5x4.6x0.4] [21:2x1.3x0.5] Measurements L x W x D (cm) [18:14.844] [19:16.258] [21:2.042] A (cm) : rea [18:4.453] [19:6.503] [21:1.021] Volume (cm) : [18:-5.00%] [19:-12.50%] [21:30.70%] % Reduction in A [18:rea: -57.50%] [19:-50.00%] [21:-15.50%] % Reduction in Volume: [18:Grade 2] [19:Grade 2] [21:Grade 2] Classification: [18:Large] [19:Large] [21:Medium] Exudate A mount: [18:Purulent] [19:Purulent] [21:Purulent] Exudate Type: [18:yellow, brown, green] [19:yellow, brown, green] [21:yellow, brown, green] Exudate Color: [18:Distinct, outline attached] [19:Distinct, outline attached] [21:Distinct, outline attached] Wound Margin: [18:Large (67-100%)] [19:Small (1-33%)] [21:Medium (34-66%)] Granulation A mount: [18:Red, Pink] [19:N/A] [21:Red, Pink] Granulation Quality: [18:Small (1-33%)] [19:Large (67-100%)] [21:Medium (34-66%)] Necrotic A mount: [18:Fat Layer (Subcutaneous Tissue): Yes Fat Layer (Subcutaneous Tissue): Yes Fat Layer (Subcutaneous Tissue): Yes] Exposed Structures: [18:Fascia: No Tendon: No Muscle: No Joint: No Bone: No Small (1-33%)] [19:Fascia: No Tendon: No Muscle: No Joint: No Bone: No None] [21:Fascia: No Tendon: No Muscle: No Joint: No Bone: No None] Epithelialization: [18:N/A] [19:Debridement - Excisional] [21:N/A] Debridement: Pre-procedure Verification/Time Out N/A [19:09:55] [21:N/A] Taken: [18:N/A] [19:Lidocaine 5% topical ointment] [21:N/A] Pain Control: [18:N/A] [19:Subcutaneous, Slough] [21:N/A] Tissue Debrided: [18:N/A] [19:Skin/Subcutaneous Tissue] [21:N/A] Level: [18:N/A] [19:20.7] [21:N/A] Debridement A (sq cm): [18:rea N/A] [19:Curette] [21:N/A] Instrument: [18:N/A] [19:Minimum] [21:N/A] Bleeding: [18:N/A] [19:Pressure] [21:N/A] Hemostasis A chieved: [18:N/A] [19:0] [21:N/A] Procedural Pain: [18:N/A] [19:0] [21:N/A] Post Procedural Pain: [18:N/A] [19:Procedure  was tolerated well] [21:N/A] Debridement Treatment Response: [18:N/A] [19:4.5x4.6x0.4] [21:N/A] Post Debridement Measurements L x W x D (cm) [18:N/A] [19:6.503] [21:N/A] Post Debridement Volume: (cm) [18:N/A] [19:Debridement] [21:N/A] Treatment Notes Electronic Signature(s) Signed: 04/27/2021 5:02:06 PM By: Linton Ham MD Signed: 04/27/2021 6:11:26 PM By: Deon Pilling RN, BSN Entered By: Linton Ham on 04/27/2021 10:03:56 -------------------------------------------------------------------------------- Multi-Disciplinary Care Plan Details Patient Name: Date of Service: [name]. 04/27/2021 8:45 A M Medical Record Number: [id number] Patient Account Number: [id number] Date of Birth/Sex: Treating RN: 09-Oct-1950 (70 y.o. [name] Primary Care Rubens Cranston: [name] Other Clinician: Referring Elward Nocera: Treating Vinayak Bobier/Extender: Stormy Card in Treatment: 2 Multidisciplinary Care Plan reviewed with physician Active Inactive Soft Tissue Infection Nursing Diagnoses: Impaired tissue integrity Potential for infection: soft tissue Goals: Patient's soft tissue infection will resolve Date Initiated: 04/20/2021 Target Resolution Date: 05/19/2021 Goal Status: Active Interventions: Assess signs and symptoms of infection every visit Provide education on infection Treatment Activities: Culture : 04/20/2021 Education provided on Infection : 04/20/2021 Systemic antibiotics : 04/20/2021 T ordered outside of clinic : 04/20/2021 est Notes: Wound/Skin Impairment Nursing Diagnoses: Impaired tissue integrity Knowledge deficit related to ulceration/compromised skin integrity Goals: Patient will have a decrease in wound volume by X% from date: (specify in notes) Date Initiated: 04/12/2021 Date Inactivated: 04/27/2021 Target Resolution Date: 04/23/2021 Goal  Status: Met Patient/caregiver will verbalize understanding of skin care regimen Date  Initiated: 04/12/2021 Target Resolution Date: 05/25/2021 Goal Status: Active Ulcer/skin breakdown will have a volume reduction of 30% by week 4 Date Initiated: 04/12/2021 Date Inactivated: 04/27/2021 Target Resolution Date: 04/27/2021 Goal Status: Unmet Unmet Reason: infection Ulcer/skin breakdown will have a volume reduction of 50% by week 8 Date Initiated: 04/27/2021 Target Resolution Date: 05/25/2021 Goal Status: Active Interventions: Assess patient/caregiver ability to obtain necessary supplies Assess patient/caregiver ability to perform ulcer/skin care regimen upon admission and as needed Assess ulceration(s) every visit Notes: Electronic Signature(s) Signed: 04/27/2021 6:04:58 PM By: Baruch Gouty RN, BSN Entered By: Baruch Gouty on 04/27/2021 09:58:32 -------------------------------------------------------------------------------- Pain Assessment Details Patient Name: Date of Service: [name]. 04/27/2021 8:45 A M Medical Record Number: [id number] Patient Account Number: [id number] Date of Birth/Sex: Treating RN: 02/01/1951 (70 y.o. [name] Primary Care Shelly Shoultz: [name] Other Clinician: Referring Aladdin Kollmann: Treating Siennah Barrasso/Extender: Stormy Card in Treatment: 2 Active Problems Location of Pain Severity and Description of Pain Patient Has Paino Yes Site Locations Pain Location: Pain in Ulcers With Dressing Change: No Duration of the Pain. Constant / Intermittento Constant Rate the pain. Current Pain Level: 5 Worst Pain Level: 10 Least Pain Level: 3 Tolerable Pain Level: 5 Character of Pain Describe the Pain: Throbbing Pain Management and Medication Current Pain Management: Medication: No Cold Application: No Rest: No Massage: No Activity: No T.E.N.S.: No Heat Application: No Leg drop or elevation: No Is the Current Pain Management Adequate: Adequate How does your wound impact your activities of daily  livingo Sleep: No Bathing: No Appetite: No Relationship With Others: No Bladder Continence: No Emotions: No Bowel Continence: No Work: No Toileting: No Drive: No Dressing: No Hobbies: No Notes Elevates LLE when possible Electronic Signature(s) Signed: 04/27/2021 5:54:47 PM By: Dellie Catholic RN Signed: 04/27/2021 6:11:26 PM By: Deon Pilling RN, BSN Entered By: Dellie Catholic on 04/27/2021 09:23:19 -------------------------------------------------------------------------------- Patient/Caregiver Education Details Patient Name: Date of Service: [name] 11/3/2022andnbsp8:45 A M Medical Record Number: [id number] Patient Account Number: [id number] Date of Birth/Gender: Treating RN: 1951/03/08 (70 y.o. [name] Primary Care Physician: [name] Other Clinician: Referring Physician: Treating Physician/Extender: Stormy Card in Treatment: 2 Education Assessment Education Provided To: Patient Education Topics Provided Infection: Methods: Explain/Verbal Responses: Reinforcements needed, State content correctly Venous: Methods: Explain/Verbal Responses: Reinforcements needed, State content correctly Wound/Skin Impairment: Methods: Explain/Verbal Responses: Reinforcements needed, State content correctly Electronic Signature(s) Signed: 04/27/2021 6:04:58 PM By: Baruch Gouty RN, BSN Entered By: Baruch Gouty on 04/27/2021 09:59:30 -------------------------------------------------------------------------------- Wound Assessment Details Patient Name: Date of Service: [name]. 04/27/2021 8:45 A M Medical Record Number: [id number] Patient Account Number: [id number] Date of Birth/Sex: Treating RN: August 21, 1950 (70 y.o. [name], [name] Primary Care Lydia Toren: [name] Other Clinician: Referring Lasonya Hubner: Treating Mackinzie Vuncannon/Extender: Stormy Card in Treatment: 2 Wound Status Wound Number: 18  Primary Diabetic Wound/Ulcer of the Lower Extremity Etiology: Wound Location: Left, Plantar Metatarsal head first Wound Open Wounding Event: Gradually Appeared Status: Date Acquired: 08/23/2020 Comorbid Glaucoma, Sleep Apnea, Hypertension, Peripheral Arterial Disease, Weeks Of Treatment: 2 History: Peripheral Venous Disease, Type II Diabetes, Gout, Osteoarthritis, Clustered Wound: No Neuropathy Wound Measurements Length: (cm) 4.2 Width: (cm) 4.5 Depth: (cm) 0.3 Area: (cm) 14.844 Volume: (cm) 4.453 % Reduction in Area: -5% % Reduction in Volume: -57.5% Epithelialization: Small (1-33%) Wound Description Classification: Grade 2 Wound Margin: Distinct, outline attached Exudate Amount: Large Exudate Type: Purulent  Exudate Color: yellow, brown, green Wound Bed Granulation Amount: Large (67-100%) Granulation Quality: Red, Pink Necrotic Amount: Small (1-33%) Necrotic Quality: Adherent Slough Foul Odor After Cleansing: No Slough/Fibrino No Exposed Structure Fascia Exposed: No Fat Layer (Subcutaneous Tissue) Exposed: Yes Tendon Exposed: No Muscle Exposed: No Joint Exposed: No Bone Exposed: No Treatment Notes Wound #18 (Metatarsal head first) Wound Laterality: Plantar, Left Cleanser Normal Saline Discharge Instruction: Cleanse the wound with Normal Saline prior to applying a clean dressing using gauze sponges, not tissue or cotton balls. Soap and Water Discharge Instruction: May shower and wash wound with dial antibacterial soap and water prior to dressing change. Wound Cleanser Discharge Instruction: Cleanse the wound with wound cleanser prior to applying a clean dressing using gauze sponges, not tissue or cotton balls. Peri-Wound Care Triamcinolone 15 (g) Discharge Instruction: Use triamcinolone 15 (g) as directed Zinc Oxide Ointment 30g tube Discharge Instruction: Apply Zinc Oxide to periwound with each dressing change Sween Lotion (Moisturizing lotion) Discharge  Instruction: Apply moisturizing lotion as directed Topical Gentamicin Discharge Instruction: As directed by physician Primary Dressing KerraCel Ag Gelling Fiber Dressing, 4x5 in (silver alginate) Discharge Instruction: Apply silver alginate to wound bed as instructed Secondary Dressing ABD Pad, 5x9 Discharge Instruction: Apply over primary dressing as directed. Zetuvit Plus 4x8 in Discharge Instruction: Apply over primary dressing as directed. CarboFLEX Odor Control Dressing, 4x4 in Discharge Instruction: Apply over primary dressing as directed. Secured With Compression Wrap Unnaboot w/Calamine, 4x10 (in/yd) Discharge Instruction: Apply Unnaboot as directed. May also use Miliken CoFlex Calamine 2 layer compression system as alternative. Compression Stockings Add-Ons Electronic Signature(s) Signed: 04/27/2021 5:54:47 PM By: Dellie Catholic RN Signed: 04/27/2021 6:11:26 PM By: Deon Pilling RN, BSN Entered By: Dellie Catholic on 04/27/2021 09:42:24 -------------------------------------------------------------------------------- Wound Assessment Details Patient Name: Date of Service: [name]. 04/27/2021 8:45 A M Medical Record Number: [id number] Patient Account Number: [id number] Date of Birth/Sex: Treating RN: 1950/12/23 (70 y.o. [name], [name] Primary Care Hassani Sliney: Other Clinician: [name] Referring Jakorian Marengo: Treating Fabrizio Filip/Extender: Stormy Card in Treatment: 2 Wound Status Wound Number: 19 Primary Diabetic Wound/Ulcer of the Lower Extremity Etiology: Wound Location: Left, Anterior Lower Leg Wound Open Wounding Event: Gradually Appeared Status: Date Acquired: 08/23/2020 Comorbid Glaucoma, Sleep Apnea, Hypertension, Peripheral Arterial Disease, Weeks Of Treatment: 2 History: Peripheral Venous Disease, Type II Diabetes, Gout, Osteoarthritis, Clustered Wound: No Neuropathy Wound Measurements Length: (cm) 4.5 Width: (cm) 4.6 Depth:  (cm) 0.4 Area: (cm) 16.258 Volume: (cm) 6.503 % Reduction in Area: -12.5% % Reduction in Volume: -50% Epithelialization: None Wound Description Classification: Grade 2 Wound Margin: Distinct, outline attached Exudate Amount: Large Exudate Type: Purulent Exudate Color: yellow, brown, green Foul Odor After Cleansing: No Slough/Fibrino Yes Wound Bed Granulation Amount: Small (1-33%) Exposed Structure Necrotic Amount: Large (67-100%) Fascia Exposed: No Necrotic Quality: Adherent Slough Fat Layer (Subcutaneous Tissue) Exposed: Yes Tendon Exposed: No Muscle Exposed: No Joint Exposed: No Bone Exposed: No Treatment Notes Wound #19 (Lower Leg) Wound Laterality: Left, Anterior Cleanser Normal Saline Discharge Instruction: Cleanse the wound with Normal Saline prior to applying a clean dressing using gauze sponges, not tissue or cotton balls. Soap and Water Discharge Instruction: May shower and wash wound with dial antibacterial soap and water prior to dressing change. Wound Cleanser Discharge Instruction: Cleanse the wound with wound cleanser prior to applying a clean dressing using gauze sponges, not tissue or cotton balls. Peri-Wound Care Triamcinolone 15 (g) Discharge Instruction: Use triamcinolone 15 (g) as directed Sween Lotion (Moisturizing lotion) Discharge Instruction: Apply  moisturizing lotion as directed Topical Gentamicin Discharge Instruction: As directed by physician Primary Dressing KerraCel Ag Gelling Fiber Dressing, 4x5 in (silver alginate) Discharge Instruction: Apply silver alginate to wound bed as instructed Secondary Dressing ABD Pad, 5x9 Discharge Instruction: Apply over primary dressing as directed. Zetuvit Plus 4x8 in Discharge Instruction: Apply over primary dressing as directed. CarboFLEX Odor Control Dressing, 4x4 in Discharge Instruction: Apply over primary dressing as directed. Secured With Compression Wrap Unnaboot w/Calamine, 4x10  (in/yd) Discharge Instruction: Apply Unnaboot as directed. May also use Miliken CoFlex Calamine 2 layer compression system as alternative. Compression Stockings Add-Ons Electronic Signature(s) Signed: 04/27/2021 5:54:47 PM By: Dellie Catholic RN Signed: 04/27/2021 6:11:26 PM By: Deon Pilling RN, BSN Entered By: Dellie Catholic on 04/27/2021 09:45:20 -------------------------------------------------------------------------------- Wound Assessment Details Patient Name: Date of Service: [name]. 04/27/2021 8:45 A M Medical Record Number: [id number] Patient Account Number: [id number] Date of Birth/Sex: Treating RN: 1950/11/20 (70 y.o. [name], [name] Primary Care Bocephus Cali: [name] Other Clinician: Referring Partick Musselman: Treating Alhassan Everingham/Extender: Stormy Card in Treatment: 2 Wound Status Wound Number: 21 Primary Diabetic Wound/Ulcer of the Lower Extremity Etiology: Wound Location: Left, Medial Lower Leg Wound Open Wounding Event: Gradually Appeared Status: Date Acquired: 08/23/2020 Comorbid Glaucoma, Sleep Apnea, Hypertension, Peripheral Arterial Disease, Weeks Of Treatment: 2 History: Peripheral Venous Disease, Type II Diabetes, Gout, Osteoarthritis, Clustered Wound: No Neuropathy Wound Measurements Length: (cm) 2 Width: (cm) 1.3 Depth: (cm) 0.5 Area: (cm) 2.042 Volume: (cm) 1.021 % Reduction in Area: 30.7% % Reduction in Volume: -15.5% Epithelialization: None Wound Description Classification: Grade 2 Wound Margin: Distinct, outline attached Exudate Amount: Medium Exudate Type: Purulent Exudate Color: yellow, brown, green Foul Odor After Cleansing: No Slough/Fibrino Yes Wound Bed Granulation Amount: Medium (34-66%) Exposed Structure Granulation Quality: Red, Pink Fascia Exposed: No Necrotic Amount: Medium (34-66%) Fat Layer (Subcutaneous Tissue) Exposed: Yes Necrotic Quality: Adherent Slough Tendon Exposed: No Muscle Exposed:  No Joint Exposed: No Bone Exposed: No Treatment Notes Wound #21 (Lower Leg) Wound Laterality: Left, Medial Cleanser Normal Saline Discharge Instruction: Cleanse the wound with Normal Saline prior to applying a clean dressing using gauze sponges, not tissue or cotton balls. Soap and Water Discharge Instruction: May shower and wash wound with dial antibacterial soap and water prior to dressing change. Wound Cleanser Discharge Instruction: Cleanse the wound with wound cleanser prior to applying a clean dressing using gauze sponges, not tissue or cotton balls. Peri-Wound Care Triamcinolone 15 (g) Discharge Instruction: Use triamcinolone 15 (g) as directed Sween Lotion (Moisturizing lotion) Discharge Instruction: Apply moisturizing lotion as directed Topical Gentamicin Discharge Instruction: As directed by physician Primary Dressing KerraCel Ag Gelling Fiber Dressing, 4x5 in (silver alginate) Discharge Instruction: Apply silver alginate to wound bed as instructed Secondary Dressing ABD Pad, 5x9 Discharge Instruction: Apply over primary dressing as directed. Zetuvit Plus 4x8 in Discharge Instruction: Apply over primary dressing as directed. CarboFLEX Odor Control Dressing, 4x4 in Discharge Instruction: Apply over primary dressing as directed. Secured With Compression Wrap Unnaboot w/Calamine, 4x10 (in/yd) Discharge Instruction: Apply Unnaboot as directed. May also use Miliken CoFlex Calamine 2 layer compression system as alternative. Compression Stockings Add-Ons Electronic Signature(s) Signed: 04/27/2021 5:54:47 PM By: Dellie Catholic RN Signed: 04/27/2021 6:11:26 PM By: Deon Pilling RN, BSN Entered By: Dellie Catholic on 04/27/2021 09:47:21 -------------------------------------------------------------------------------- Vitals Details Patient Name: Date of Service: [name]. 04/27/2021 8:45 A M Medical Record Number: [id number] Patient Account Number: [id number] Date of  Birth/Sex: Treating RN: Apr 01, 1951 (70 y.o. [name] Primary Care  Lajoya Dombek: [name] Other Clinician: Referring Frankie Scipio: Treating Aleigh Grunden/Extender: Stormy Card in Treatment: 2 Vital Signs Time Taken: 09:19 Temperature (F): 98.1 Height (in): 74 Pulse (bpm): 77 Weight (lbs): 238 Respiratory Rate (breaths/min): 18 Body Mass Index (BMI): 30.6 Blood Pressure (mmHg): 131/84 Reference Range: 80 - 120 mg / dl Electronic Signature(s) Signed: 04/27/2021 5:54:47 PM By: Dellie Catholic RN Entered By: Dellie Catholic on 04/27/2021 09:21:14

## 2021-04-27 NOTE — Progress Notes (Signed)
KERRICK, MILER ([id number]) Visit Report for 04/27/2021 Debridement Details Patient Name: Date of Service: [name], [name] 04/27/2021 8:45 A M Medical Record Number: [id number] Patient Account Number: [id number] Date of Birth/Sex: Treating RN: 11-30-1950 (70 y.o. [name], [name] Primary Care Provider: [name] Other Clinician: Referring Provider: Treating Provider/Extender: Stormy Card in Treatment: 2 Debridement Performed for Assessment: Wound #19 Left,Anterior Lower Leg Performed By: Physician Ricard Dillon., MD Debridement Type: Debridement Severity of Tissue Pre Debridement: Fat layer exposed Level of Consciousness (Pre-procedure): Awake and Alert Pre-procedure Verification/Time Out Yes - 09:55 Taken: Start Time: 09:55 Pain Control: Lidocaine 5% topical ointment T Area Debrided (L x W): otal 4.5 (cm) x 4.6 (cm) = 20.7 (cm) Tissue and other material debrided: Viable, Non-Viable, Slough, Subcutaneous, Slough Level: Skin/Subcutaneous Tissue Debridement Description: Excisional Instrument: Curette Bleeding: Minimum Hemostasis Achieved: Pressure End Time: 11:00 Procedural Pain: 0 Post Procedural Pain: 0 Response to Treatment: Procedure was tolerated well Level of Consciousness (Post- Awake and Alert procedure): Post Debridement Measurements of Total Wound Length: (cm) 4.5 Width: (cm) 4.6 Depth: (cm) 0.4 Volume: (cm) 6.503 Character of Wound/Ulcer Post Debridement: Improved Severity of Tissue Post Debridement: Fat layer exposed Post Procedure Diagnosis Same as Pre-procedure Electronic Signature(s) Signed: 04/27/2021 5:02:06 PM By: Linton Ham MD Signed: 04/27/2021 6:11:26 PM By: Deon Pilling RN, BSN Entered By: Linton Ham on 04/27/2021 10:04:34 -------------------------------------------------------------------------------- HPI Details Patient Name: Date of Service: [name]. 04/27/2021 8:45 A M Medical Record Number:  [id number] Patient Account Number: [id number] Date of Birth/Sex: Treating RN: [birth year] (70 y.o. [name] Primary Care Provider: [name] Other Clinician: Referring Provider: Treating Provider/Extender: Stormy Card in Treatment: 2 History of Present Illness HPI Description: 10/11/17; [name] is a 70 year old man who tells me that in 2015 he slipped down the latter traumatizing his left leg. He developed a wound in the same spot the area that we are currently looking at. He states this closed over for the most part although he always felt it was somewhat unstable. In 2016 he hit the same area with the door of his car had this reopened. He tells me that this is never really closed although sometimes an inflow it remains open on a constant basis. He has not been using any specific dressing to this except for topical antibiotics the nature of which were not really sure. His primary doctor did send him to see Dr. [name] of interventional cardiology. He underwent an angiogram on 08/06/17 and he underwent a PTA and directional atherectomy of the lesser distal SFA and popliteal arteries which resulted in brisk improvement in blood flow. It was noted that he had 2 vessel runoff through the anterior tibial and peroneal. He is also been to see vascular and interventional radiologist. He was not felt to have any significant superficial venous insufficiency. Presumably is not a candidate for any ablation. It was suggested he come here for wound care. The patient is a type II diabetic on insulin. He also has a history of venous insufficiency. ABIs on the left were noncompressible in our clinic 10/21/17; patient we admitted to the clinic last week. 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 We put Iodosorb on him after an aggressive debridement and 3 layer compression. He complained of pain in his ankle and itching  with is skin in fact he scratched the area on the medial calf superiorly at  the rim of our wraps and he has 2 small open areas in that location today which are new. I changed his primary dressing today to silver collagen. As noted he is already had revascularization and does not have any significant superficial venous insufficiency that would be amenable to ablation 10/28/17; patient admitted to the clinic 2 weeks ago. He has a smaller Wound. Scratch injury from last week revealed. There is large wound over the tibial area. This is smaller. Granulation looks healthy. No need for debridement. 11/04/17; the wound on the left lateral calf looks better. Improved dimensions. Surface of this looks better. We've been maintaining him and Kerlix Coban wraps. He finds this much more comfortable. Silver collagen dressing 11/11/17; left lateral Wound continues to look healthy be making progress. Using a #5 curet I removed removed nonviable skin from the surface of the wound and then necrotic debris from the wound surface. Surface of the wound continues to look healthy. He also has an open area on the left great toenail bed. We've been using topical antibiotics. 11/19/17; left anterior lateral wound continues to look healthy but it's not closed. He also had a small wound above this on the left leg Initially traumatic wounds in the setting of significant chronic venous insufficiency and stasis dermatitis 11/25/17; left anterior wounds superiorly is closed still a small wound inferiorly. 12/02/17; left anterior tibial area. Arrives today with adherent callus. Post debridement clearly not completely closed. Hydrofera Blue under 3 layer compression. 12/09/17; left anterior tibia. Circumferential eschar however the wound bed looks stable to improved. We've been using Hydrofera Blue under 3 layer compression 12/17/17; left anterior tibia. Apparently this was felt to be closed however when the wrap was taken off there is a skin  tear to reopen wounds in the same area we've been using Hydrofera Blue under 3 layer compression 12/23/17 left anterior tibia. Not close to close this week apparently the [facility] was stuck to this again. Still circumferential eschar requiring debridement. I put a contact layer on this this time under the Hydrofera Blue 12/31/17; left anterior tibia. Wound is better slight amount of hyper-granulation. Using Hydrofera Blue over Adaptic. 01/07/18; left anterior tibia. The wound had some surface eschar however after this was removed he has no open wound.he was already revascularized by Dr. [name] when he came to our clinic with atherectomy of the left SFA and popliteal artery. He was also sent to interventional radiology for venous reflux studies. He was not felt to have significant reflux but certainly has chronic venous changes of his skin with hemosiderin deposition around this area. He will definitely need to lubricate his skin and wear compression stocking and I've talked to him about this. READMISSION 05/26/2018 This is a now 70 year old man we cared for with traumatic wounds on his left anterior lower extremity. He had been previously revascularized during that admission by Dr. [name]. Apparently in follow-up Dr. [name] noted that he had deterioration in his arterial status. He underwent a stent placement in the distal left SFA on 04/22/2018. 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 He went back to the angiography suite on 04/30/2018 he underwent PTA and balloon angioplasty of the occluded left mid anterior tibial artery, thrombotic occlusion went from 100 to 0% which reconstitutes the posterior tibial artery. He had thrombectomy and aspiration of the peroneal artery. The stent placed in the distal SFA left SFA was still occluded. He was discharged on Xarelto, it was noted on the discharge summary from this hospitalization  that he had gangrene at the tip of his left fifth toe and  there were expectations this would auto amputate. 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 He has been recuperating at [facility] in [facility] after the most recent hospitalization. He is going home tomorrow. He tells me that 2 weeks ago he traumatized the tip of his left fifth toe. He came in urgently for our review of this. This was a history of before I noted that Dr. [name] had already noted dry gangrenous changes of the left fifth toe 06/09/2018; 2-week follow-up. I did contact Dr. [name] after his last appointment and he apparently saw 1 of Dr. Irven Shelling colleagues the next day. He does not follow-up with Dr. [name] himself until Thursday of this week. 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 Nevertheless there is no evidence of infection no drainage and no pain. He had a new area that this week when we were signing him in today on the left anterior mid tibia area, this is in close proximity to the previous wound we have dealt with in this clinic. 06/23/2018; 2-week follow-up. I did not receive a recent note from Dr. [name] to review today. Our office is trying to obtain this. He is apparently not planning to do further vascular interventions and wondered about compression to try and help with the patient's chronic venous insufficiency. However we are also concerned about the arterial flow. He arrives in clinic today with a new area on the left third toe. The areas on the calf/anterior tibia are close to closing. The left fifth toe is still mummified using Betadine. -In reviewing things with the patient he has what sounds like claudication with mild to moderate amount of activity. 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 I prescribed Levaquin over the phone while we attempted to arrange a plan of care. However the patient called yesterday to report he had low-grade fever and he came  in today acutely. There is been a marked deterioration in the left third toe with spreading cellulitis up into the dorsal left foot. He was referred to the emergency room. Readmission: 06/29/2020 patient presents today for reevaluation here in our clinic he was previously treated by Dr. Dellia Nims at the latter part of 2019 in 2 the beginning of 2020. Subsequently we have not seen him since that time in the interim he did have evaluation with vein and vascular specialist specifically Dr. Anice Paganini who did perform quite extensive work for a left femoral to anterior tibial artery bypass. With that being said in the interim the patient has developed significant lymphedema and has wounds that he tells me have really never healed in regard to the incision site on the left leg. He also has multiple wounds on the feet for various reasons some of which is that he tends to pick at his feet. Fortunately there is no signs of active infection systemically at this time he does have some wounds that are little bit deeper but most are fairly superficial he seems to have good blood flow and overall everything appears to be healthy I see no bone exposed and no obvious signs of osteomyelitis. I do not know that he necessarily needs a x-ray at this point although that something we could consider depending on how things progress. The patient does have a history of lymphedema, diabetes, this  is type II, chronic kidney disease stage III, hypertension, and history of peripheral vascular disease. 07/05/2020; patient admitted last week. Is a patient I remember from 2019 he had a spreading infection involving the left foot and we sent him to the hospital. 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 He subsequently had a left femoral to anterior tibial bypass by Dr.Cain vein and vascular. He also has severe lymphedema with chronic skin changes related to that on the left leg. The most  problematic area that was new today was on the left medial great toe. This was apparently a small area last week there was purulent drainage which our intake nurse cultured. Also areas on the left medial foot and heel left lateral foot. He has 2 areas on the left medial calf left lateral calf in the setting of the severe lymphedema. 07/13/2020 on evaluation today patient appears to be doing better in my opinion compared to his last visit. The good news is there is no signs of active infection systemically and locally I do not see any signs of infection either. He did have an x-ray which was negative that is great news he had a culture which showed MRSA but at the same time he is been on the doxycycline which has helped. I do think we may want to extend this for 7 additional days 1/25; patient admitted to the clinic a few weeks ago. 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 We have been putting him under compression his edema control is a lot better but he is severe verricused skin on the left leg. He is really done quite well he still has an open area on the left medial calf and the left medial first metatarsal head. We have been using silver collagen on the leg silver alginate on the foot 07/27/2020 upon evaluation today patient appears to be doing decently well in regard to his wounds. He still has a lot of dry skin on the left leg. Some of this is starting to peel back and I think he may be able to have them out by removing some that today. Fortunately there is no signs of active infection at this time on the left leg although on the right leg he does appear to have swelling and erythema as well as some mild warmth to touch. This does have been concerned about the possibility of cellulitis although within the differential diagnosis I do think that potentially a DVT has to be at least considered. We need to rule that out before proceeding would just call in  the cellulitis. Especially since he is having pain in the posterior aspect of his calf muscle. 2/8; the patient had seen sparingly. He has severe skin changes of chronic lymphedema in the left leg thickened hyperkeratotic verrucous skin. He has an open wound on the medial part of the left first met head left mid tibia. He also has a rim of nonepithelialized skin in the anterior mid tibia. He brought in the AmLactin lotion that was been prescribed although I am not sure under compression and its utility. There concern about cellulitis on the right lower leg the last time he was here. He was put on on antibiotics. His DVT rule out was negative. The right leg looks fine he is using his stocking on this area 08/10/2020 upon evaluation today patient appears to be doing well with regard to his leg  currently. He has been tolerating the dressing changes without complication. Fortunately there is no signs of active infection which is great news. Overall very pleased with where things stand. 2/22; the patient still has an area on the medial part of the left first met his head. This looks better than when I last saw this earlier this month he has a rim of epithelialization but still some surface debris. Mostly everything on the left leg is healed. There is still a vulnerable in the left mid tibia area. 08/30/2020 upon evaluation today patient appears to be doing much better in regard to his wounds on his foot. 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 Nonetheless I think we will address that today I Minna send in a prescription for him in that regard. Overall though there does not appear to be any signs of significant worsening. 09/07/2020 on evaluation today patient's wounds over his left foot appear to be doing excellent. I do not see any signs of infection there is some callus  buildup this can require debridement for certain but overall I feel like he is managing quite nicely. He still using the AmLactin cream which has been beneficial for him as well. 3/22; left foot wound is closed. There is no open area here. He is using ammonium lactate lotion to the lower extremities to help exfoliate dry cracked skin. He has compression stockings from elastic therapy in [place]. The wound on the medial part of his left first met head is healed today. READMISSION 04/12/2021 Mr. Minnie is a patient we know fairly well he had a prolonged stay in clinic in 2019 with wounds on his left lateral and left anterior lower extremity in the setting of chronic venous insufficiency. More recently he was here earlier this year with predominantly an area on his left foot first metatarsal head plantar and he says the plantar foot broke down on its not long after we discharged him but he did not come back here. The last few months areas of broken down on his left anterior and again the left lateral lower extremity. The leg itself is very swollen chronically enlarged a lot of hyperkeratotic dry Berry Q skin in the left lower leg. His edema extends well into the thigh. He was seen by Dr. Donzetta Matters. He had ABIs on 03/02/2021 showing an ABI on the right of 1 with a TBI of 0.72 his ABI in the left at 1.09 TBI of 0.99. Monophasic and biphasic waveforms on the right. On the left monophasic waveforms were noted he went on to have an angiogram on 03/27/2021 this showed the aortic aortic and iliac segments were free of flow-limiting stenosis the left common femoral vein to evaluate the left femoral to anterior tibial artery bypass was unobstructed the bypass was patent without any areas of stenosis. We discharged the patient in bilateral juxta lite stockings but very clearly that was not sufficient to control the swelling and maintain skin integrity. He is clearly going to need compression pumps. 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 This is fortunate because he is on his feet for long periods of time. 10/27; patient comes in with our intake nurse reporting copious amount of green drainage from the left anterior mid tibia the left dorsal foot and  to a lesser extent the left medial mid tibia. We left the compression wrap on all week for the amount of edema in his left leg is quite a bit better. We use silver alginate as the primary dressing 11/3; edema control is good. Left anterior lower leg left medial lower leg and the plantar first metatarsal head. The left anterior lower leg required debridement. Deep tissue culture I did of this wound showed MRSA I put him on 10 days of doxycycline which she will start today. We have him in compression wraps. He has a security card and [employer] however he is retiring on November 15. We will need to then get him into a better offloading boot for the left foot perhaps a total contact cast Electronic Signature(s) Signed: 04/27/2021 5:02:06 PM By: Linton Ham MD Entered By: Linton Ham on 04/27/2021 10:09:52 -------------------------------------------------------------------------------- Physical Exam Details Patient Name: Date of Service: [name]. 04/27/2021 8:45 A M Medical Record Number: [id number] Patient Account Number: [id number] Date of Birth/Sex: Treating RN: Nov 28, 1950 (70 y.o. [name] Primary Care Provider: [name] Other Clinician: Referring Provider: Treating Provider/Extender: Stormy Card in Treatment: 2 Constitutional Sitting or standing Blood Pressure is within target range for patient.. Pulse regular and within target range for patient.Marland Kitchen Respirations regular, non-labored and within target range.. Temperature is normal and within the target range for the patient.Marland Kitchen Appears in no distress. Cardiovascular Pedal pulses palpable. Edema control is  better. Notes Wound exam; left lateral leg much better looking wound surface is as depth. The area on the left anterior lower leg look better but still required debridement with a #5 curette to remove tightly adherent necrotic debris. Subcutaneous debridement. The area on the left first metatarsal head is hyper granulated I did not debride this today I am waiting for more aggressive offloading Electronic Signature(s) Signed: 04/27/2021 5:02:06 PM By: Linton Ham MD Entered By: Linton Ham on 04/27/2021 10:08:43 -------------------------------------------------------------------------------- Physician Orders Details Patient Name: Date of Service: [name]. 04/27/2021 8:45 A M Medical Record Number: [id number] Patient Account Number: [id number] Date of Birth/Sex: Treating RN: [birth year] (70 y.o. [name] Primary Care Provider: [name] Other Clinician: Referring Provider: Treating Provider/Extender: Stormy Card in Treatment: 2 Verbal / Phone Orders: No Diagnosis Coding ICD-10 Coding Code Description E11.621 Type 2 diabetes mellitus with foot ulcer 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 I89.0 Lymphedema, not elsewhere classified I87.322 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verity 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 of other part of left foot with 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 - Dr. Dellia Nims Thursday Return A Nurse Visit: - Monday 05/01/21 Bathing/ Shower/ Hygiene May shower with protection but do not get wound dressing(s) wet. - Use a cast protector so you can shower without getting your wrap(s) wet Edema Control - Lymphedema / SCD / Other Elevate legs to the  level of the heart or above for 30 minutes daily and/or when sitting, a frequency of: Avoid standing for long periods of time. Patient to wear own compression stockings every day. - on right leg; Moisturize legs daily. - Ammonium LACTATE to BLE every day. Wound Treatment Wound #18 - Metatarsal head first Wound Laterality: Plantar, Left Cleanser: Normal Saline 2 x Per Week/7 Days Discharge Instructions: Cleanse the wound with  Normal Saline prior to applying a clean dressing using gauze sponges, not tissue or cotton balls. Cleanser: Soap and Water 2 x Per Week/7 Days Discharge Instructions: May shower and wash wound with dial antibacterial soap and water prior to dressing change. Cleanser: Wound Cleanser 2 x Per Week/7 Days Discharge Instructions: Cleanse the wound with wound cleanser prior to applying a clean dressing using gauze sponges, not tissue or cotton balls. Peri-Wound Care: Triamcinolone 15 (g) 2 x Per Week/7 Days Discharge Instructions: Use triamcinolone 15 (g) as directed Peri-Wound Care: Zinc Oxide Ointment 30g tube 2 x Per Week/7 Days Discharge Instructions: Apply Zinc Oxide to periwound with each dressing change Peri-Wound Care: Sween Lotion (Moisturizing lotion) 2 x Per Week/7 Days Discharge Instructions: Apply moisturizing lotion as directed Topical: Gentamicin 2 x Per Week/7 Days Discharge Instructions: As directed by physician Prim Dressing: KerraCel Ag Gelling Fiber Dressing, 4x5 in (silver alginate) 2 x Per Week/7 Days ary Discharge Instructions: Apply silver alginate to wound bed as instructed Secondary Dressing: ABD Pad, 5x9 2 x Per Week/7 Days Discharge Instructions: Apply over primary dressing as directed. Secondary Dressing: Zetuvit Plus 4x8 in 2 x Per Week/7 Days Discharge Instructions: Apply over primary dressing as directed. Secondary Dressing: CarboFLEX Odor Control Dressing, 4x4 in 2 x Per Week/7 Days Discharge Instructions: Apply over primary dressing as  directed. Compression Wrap: Unnaboot w/Calamine, 4x10 (in/yd) 2 x Per Week/7 Days Discharge Instructions: Apply Unnaboot as directed. May also use Miliken CoFlex Calamine 2 layer compression system as alternative. Wound #19 - Lower Leg Wound Laterality: Left, Anterior Cleanser: Normal Saline 1 x Per Week/7 Days Discharge Instructions: Cleanse the wound with Normal Saline prior to applying a clean dressing using gauze sponges, not tissue or cotton balls. Cleanser: Soap and Water 1 x Per Week/7 Days Discharge Instructions: May shower and wash wound with dial antibacterial soap and water prior to dressing change. Cleanser: Wound Cleanser 1 x Per Week/7 Days Discharge Instructions: Cleanse the wound with wound cleanser prior to applying a clean dressing using gauze sponges, not tissue or cotton balls. Peri-Wound Care: Triamcinolone 15 (g) 1 x Per Week/7 Days Discharge Instructions: Use triamcinolone 15 (g) as directed Peri-Wound Care: Sween Lotion (Moisturizing lotion) 1 x Per Week/7 Days Discharge Instructions: Apply moisturizing lotion as directed Topical: Gentamicin 1 x Per Week/7 Days Discharge Instructions: As directed by physician Prim Dressing: KerraCel Ag Gelling Fiber Dressing, 4x5 in (silver alginate) 1 x Per Week/7 Days ary Discharge Instructions: Apply silver alginate to wound bed as instructed Secondary Dressing: ABD Pad, 5x9 1 x Per Week/7 Days Discharge Instructions: Apply over primary dressing as directed. Secondary Dressing: Zetuvit Plus 4x8 in 1 x Per Week/7 Days Discharge Instructions: Apply over primary dressing as directed. Secondary Dressing: CarboFLEX Odor Control Dressing, 4x4 in 1 x Per Week/7 Days Discharge Instructions: Apply over primary dressing as directed. Compression Wrap: Unnaboot w/Calamine, 4x10 (in/yd) 1 x Per Week/7 Days Discharge Instructions: Apply Unnaboot as directed. May also use Miliken CoFlex Calamine 2 layer compression system as alternative. Wound  #21 - Lower Leg Wound Laterality: Left, Medial Cleanser: Normal Saline 1 x Per Week/7 Days Discharge Instructions: Cleanse the wound with Normal Saline prior to applying a clean dressing using gauze sponges, not tissue or cotton balls. Cleanser: Soap and Water 1 x Per Week/7 Days Discharge Instructions: May shower and wash wound with dial antibacterial soap and water prior to dressing change. Cleanser: Wound Cleanser 1 x Per Week/7 Days Discharge Instructions: Cleanse the wound with wound cleanser prior  to applying a clean dressing using gauze sponges, not tissue or cotton balls. Peri-Wound Care: Triamcinolone 15 (g) 1 x Per Week/7 Days Discharge Instructions: Use triamcinolone 15 (g) as directed Peri-Wound Care: Sween Lotion (Moisturizing lotion) 1 x Per Week/7 Days Discharge Instructions: Apply moisturizing lotion as directed Topical: Gentamicin 1 x Per Week/7 Days Discharge Instructions: As directed by physician Prim Dressing: KerraCel Ag Gelling Fiber Dressing, 4x5 in (silver alginate) 1 x Per Week/7 Days ary Discharge Instructions: Apply silver alginate to wound bed as instructed Secondary Dressing: ABD Pad, 5x9 1 x Per Week/7 Days Discharge Instructions: Apply over primary dressing as directed. Secondary Dressing: Zetuvit Plus 4x8 in 1 x Per Week/7 Days Discharge Instructions: Apply over primary dressing as directed. Secondary Dressing: CarboFLEX Odor Control Dressing, 4x4 in 1 x Per Week/7 Days Discharge Instructions: Apply over primary dressing as directed. Compression Wrap: Unnaboot w/Calamine, 4x10 (in/yd) 1 x Per Week/7 Days Discharge Instructions: Apply Unnaboot as directed. May also use Miliken CoFlex Calamine 2 layer compression system as alternative. Electronic Signature(s) Signed: 04/27/2021 5:02:06 PM By: Linton Ham MD Signed: 04/27/2021 6:04:58 PM By: Baruch Gouty RN, BSN Entered By: Baruch Gouty on 04/27/2021  10:11:05 -------------------------------------------------------------------------------- Problem List Details Patient Name: Date of Service: [name]. 04/27/2021 8:45 A M Medical Record Number: [id number] Patient Account Number: [id number] Date of Birth/Sex: Treating RN: 09-03-1950 (70 y.o. [name], [name] Primary Care Provider: [name] Other Clinician: Referring Provider: Treating Provider/Extender: Stormy Card in Treatment: 2 Active Problems ICD-10 Encounter Code Description Active Date MDM Diagnosis E11.621 Type 2 diabetes mellitus with foot ulcer 04/12/2021 No Yes 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 04/12/2021 No Yes I89.0 Lymphedema, not elsewhere classified 04/12/2021 No Yes I87.322 Chronic venous hypertension (idiopathic) with inflammation of left lower 04/12/2021 No Yes extremity 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 ulcer of other part of left lower leg with other specified 04/12/2021 No Yes severity 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 of other part of left foot with other specified 04/12/2021 No Yes severity E11.42 Type 2 diabetes mellitus with diabetic polyneuropathy 04/12/2021 No Yes Inactive Problems Resolved Problems Electronic Signature(s) Signed: 04/27/2021 5:02:06 PM By: Linton Ham MD Entered By: Linton Ham on 04/27/2021 10:03:42 -------------------------------------------------------------------------------- Progress Note Details Patient Name: Date of Service: [name]. 04/27/2021 8:45 A M Medical Record Number: [id number] Patient Account Number: [id number] Date of Birth/Sex: Treating RN: 16-Aug-1950 (70 y.o. [name] Primary Care Provider: [name] Other Clinician: Referring Provider: Treating Provider/Extender: Stormy Card in Treatment: 2 Subjective History of Present Illness (HPI) 10/11/17; Mr. Hoogland is a 70 year old man who  tells me that in 2015 he slipped down the latter traumatizing his left leg. He developed a wound in the same spot the area that we are currently looking at. He states this closed over for the most part although he always felt it was somewhat unstable. In 2016 he hit the same area with the door of his car had this reopened. He tells me that this is never really closed although sometimes an inflow it remains open on a constant basis. He has not been using any specific dressing to this except for topical antibiotics the nature of which were not really sure. His primary doctor did send him to see Dr. [name] of interventional cardiology. He underwent an angiogram on 08/06/17 and he underwent a PTA and directional atherectomy of the lesser distal SFA and popliteal arteries which resulted in brisk improvement in blood flow.  It was noted that he had 2 vessel runoff through the anterior tibial and peroneal. He is also been to see vascular and interventional radiologist. He was not felt to have any significant superficial venous insufficiency. Presumably is not a candidate for any ablation. It was suggested he come here for wound care. The patient is a type II diabetic on insulin. He also has a history of venous insufficiency. ABIs on the left were noncompressible in our clinic 10/21/17; patient we admitted to the clinic last week. 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 We put Iodosorb on him after an aggressive debridement and 3 layer compression. He complained of pain in his ankle and itching with is skin in fact he scratched the area on the medial calf superiorly at the rim of our wraps and he has 2 small open areas in that location today which are new. I changed his primary dressing today to silver collagen. As noted he is already had revascularization and does not have any significant superficial venous insufficiency that would be amenable  to ablation 10/28/17; patient admitted to the clinic 2 weeks ago. He has a smaller Wound. Scratch injury from last week revealed. There is large wound over the tibial area. This is smaller. Granulation looks healthy. No need for debridement. 11/04/17; the wound on the left lateral calf looks better. Improved dimensions. Surface of this looks better. We've been maintaining him and Kerlix Coban wraps. He finds this much more comfortable. Silver collagen dressing 11/11/17; left lateral Wound continues to look healthy be making progress. Using a #5 curet I removed removed nonviable skin from the surface of the wound and then necrotic debris from the wound surface. Surface of the wound continues to look healthy. ooHe also has an open area on the left great toenail bed. We've been using topical antibiotics. 11/19/17; left anterior lateral wound continues to look healthy but it's not closed. ooHe also had a small wound above this on the left leg ooInitially traumatic wounds in the setting of significant chronic venous insufficiency and stasis dermatitis 11/25/17; left anterior wounds superiorly is closed still a small wound inferiorly. 12/02/17; left anterior tibial area. Arrives today with adherent callus. Post debridement clearly not completely closed. Hydrofera Blue under 3 layer compression. 12/09/17; left anterior tibia. Circumferential eschar however the wound bed looks stable to improved. We've been using Hydrofera Blue under 3 layer compression 12/17/17; left anterior tibia. Apparently this was felt to be closed however when the wrap was taken off there is a skin tear to reopen wounds in the same area we've been using Hydrofera Blue under 3 layer compression 12/23/17 left anterior tibia. Not close to close this week apparently the [facility] was stuck to this again. Still circumferential eschar requiring debridement. I put a contact layer on this this time under the Hydrofera Blue 12/31/17; left  anterior tibia. Wound is better slight amount of hyper-granulation. Using Hydrofera Blue over Adaptic. 01/07/18; left anterior tibia. The wound had some surface eschar however after this was removed he has no open wound.he was already revascularized by Dr. [name] when he came to our clinic with atherectomy of the left SFA and popliteal artery. He was also sent to interventional radiology for venous reflux studies. He was not felt to have significant reflux but certainly has chronic venous changes of his skin with hemosiderin deposition around this area. He will definitely need to lubricate his skin and wear  compression stocking and I've talked to him about this. READMISSION 05/26/2018 This is a now 70 year old man we cared for with traumatic wounds on his left anterior lower extremity. He had been previously revascularized during that admission by Dr. [name]. Apparently in follow-up Dr. [name] noted that he had deterioration in his arterial status. He underwent a stent placement in the distal left SFA on 04/22/2018. 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 He went back to the angiography suite on 04/30/2018 he underwent PTA and balloon angioplasty of the occluded left mid anterior tibial artery, thrombotic occlusion went from 100 to 0% which reconstitutes the posterior tibial artery. He had thrombectomy and aspiration of the peroneal artery. The stent placed in the distal SFA left SFA was still occluded. 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 He has been recuperating at [facility] in [facility] after the most recent hospitalization. He is going home tomorrow. He tells me that 2 weeks ago he traumatized the tip of his left fifth  toe. He came in urgently for our review of this. This was a history of before I noted that Dr. [name] had already noted dry gangrenous changes of the left fifth toe 06/09/2018; 2-week follow-up. I did contact Dr. [name] after his last appointment and he apparently saw 1 of Dr. Irven Shelling colleagues the next day. He does not follow-up with Dr. [name] himself until Thursday of this week. 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 Nevertheless there is no evidence of infection no drainage and no pain. He had a new area that this week when we were signing him in today on the left anterior mid tibia area, this is in close proximity to the previous wound we have dealt with in this clinic. 06/23/2018; 2-week follow-up. I did not receive a recent note from Dr. [name] to review today. Our office is trying to obtain this. He is apparently not planning to do further vascular interventions and wondered about compression to try and help with the patient's chronic venous insufficiency. However we are also concerned about the arterial flow. ooHe arrives in clinic today with a new area on the left third toe. The areas on the calf/anterior tibia are close to closing. The left fifth toe is still mummified using Betadine. -In reviewing things with the patient he has what sounds like claudication with mild to moderate amount of activity. 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 I prescribed Levaquin over the phone while we attempted to arrange a plan of care. However the patient called yesterday to report he had low-grade fever and he came in today acutely. There is been a marked deterioration in the left third toe with spreading cellulitis up into the dorsal left foot. He was referred to the emergency room. Readmission: 06/29/2020 patient presents today for reevaluation here in our clinic he was previously treated by Dr. Dellia Nims at the latter part of 2019 in 2 the beginning  of 2020. Subsequently we have not seen him since that time in the interim he did have evaluation with vein and vascular specialist specifically Dr. Anice Paganini who did perform quite extensive work for a left femoral to anterior tibial  artery bypass. With that being said in the interim the patient has developed significant lymphedema and has wounds that he tells me have really never healed in regard to the incision site on the left leg. He also has multiple wounds on the feet for various reasons some of which is that he tends to pick at his feet. Fortunately there is no signs of active infection systemically at this time he does have some wounds that are little bit deeper but most are fairly superficial he seems to have good blood flow and overall everything appears to be healthy I see no bone exposed and no obvious signs of osteomyelitis. I do not know that he necessarily needs a x-ray at this point although that something we could consider depending on how things progress. The patient does have a history of lymphedema, diabetes, this is type II, chronic kidney disease stage III, hypertension, and history of peripheral vascular disease. 07/05/2020; patient admitted last week. Is a patient I remember from 2019 he had a spreading infection involving the left foot and we sent him to the hospital. 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 He subsequently had a left femoral to anterior tibial bypass by Dr.[facility] vein and vascular. He also has severe lymphedema with chronic skin changes related to that on the left leg. The most problematic area that was new today was on the left medial great toe. This was apparently a small area last week there was purulent drainage which our intake nurse cultured. Also areas on the left medial foot and heel left lateral foot. He has 2 areas on the left medial calf left lateral calf in the setting of the severe lymphedema. 07/13/2020 on  evaluation today patient appears to be doing better in my opinion compared to his last visit. The good news is there is no signs of active infection systemically and locally I do not see any signs of infection either. He did have an x-ray which was negative that is great news he had a culture which showed MRSA but at the same time he is been on the doxycycline which has helped. I do think we may want to extend this for 7 additional days 1/25; patient admitted to the clinic a few weeks ago. 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 We have been putting him under compression his edema control is a lot better but he is severe verricused skin on the left leg. He is really done quite well he still has an open area on the left medial calf and the left medial first metatarsal head. We have been using silver collagen on the leg silver alginate on the foot 07/27/2020 upon evaluation today patient appears to be doing decently well in regard to his wounds. He still has a lot of dry skin on the left leg. Some of this is starting to peel back and I think he may be able to have them out by removing some that today. Fortunately there is no signs of active infection at this time on the left leg although on the right leg he does appear to have swelling and erythema as well as some mild warmth to touch. This does have been concerned about the possibility of cellulitis although within the differential diagnosis I do think that potentially a DVT has to be at least considered. We need to rule that out before proceeding would just call in  the cellulitis. Especially since he is having pain in the posterior aspect of his calf muscle. 2/8; the patient had seen sparingly. He has severe skin changes of chronic lymphedema in the left leg thickened hyperkeratotic verrucous skin. He has an open wound on the medial part of the left first met head left mid tibia. He also has a rim of  nonepithelialized skin in the anterior mid tibia. He brought in the AmLactin lotion that was been prescribed although I am not sure under compression and its utility. There concern about cellulitis on the right lower leg the last time he was here. He was put on on antibiotics. His DVT rule out was negative. The right leg looks fine he is using his stocking on this area 08/10/2020 upon evaluation today patient appears to be doing well with regard to his leg currently. He has been tolerating the dressing changes without complication. Fortunately there is no signs of active infection which is great news. Overall very pleased with where things stand. 2/22; the patient still has an area on the medial part of the left first met his head. This looks better than when I last saw this earlier this month he has a rim of epithelialization but still some surface debris. Mostly everything on the left leg is healed. There is still a vulnerable in the left mid tibia area. 08/30/2020 upon evaluation today patient appears to be doing much better in regard to his wounds on his foot. 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 Nonetheless I think we will address that today I Minna send in a prescription for him in that regard. Overall though there does not appear to be any signs of significant worsening. 09/07/2020 on evaluation today patient's wounds over his left foot appear to be doing excellent. I do not see any signs of infection there is some callus buildup this can require debridement for certain but overall I feel like he is managing quite nicely. He still using the AmLactin cream which has been beneficial for him as well. 3/22; left foot wound is closed. There is no open area here. He is using ammonium lactate lotion to the lower extremities to help exfoliate dry cracked skin.  He has compression stockings from [facility] in [place]. The wound on the medial part of his left first met head is healed today. READMISSION 04/12/2021 Mr. Bednarz is a patient we know fairly well he had a prolonged stay in clinic in 2019 with wounds on his left lateral and left anterior lower extremity in the setting of chronic venous insufficiency. More recently he was here earlier this year with predominantly an area on his left foot first metatarsal head plantar and he says the plantar foot broke down on its not long after we discharged him but he did not come back here. The last few months areas of broken down on his left anterior and again the left lateral lower extremity. The leg itself is very swollen chronically enlarged a lot of hyperkeratotic dry Berry Q skin in the left lower leg. His edema extends well into the thigh. He was seen by Dr. Donzetta Matters. He had ABIs on 03/02/2021 showing an ABI on the right of 1 with a TBI of 0.72 his ABI in the left at 1.09 TBI of 0.99. Monophasic and biphasic waveforms on the right. On the left monophasic  waveforms were noted he went on to have an angiogram on 03/27/2021 this showed the aortic aortic and iliac segments were free of flow-limiting stenosis the left common femoral vein to evaluate the left femoral to anterior tibial artery bypass was unobstructed the bypass was patent without any areas of stenosis. We discharged the patient in bilateral juxta lite stockings but very clearly that was not sufficient to control the swelling and maintain skin integrity. He is clearly going to need compression pumps. 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 This is fortunate because he is on his feet for long periods of time. 10/27; patient comes in with our intake nurse reporting copious amount of green drainage from the left anterior mid tibia the left dorsal foot and to a lesser extent the left medial mid tibia.  We left the compression wrap on all week for the amount of edema in his left leg is quite a bit better. We use silver alginate as the primary dressing 11/3; edema control is good. Left anterior lower leg left medial lower leg and the plantar first metatarsal head. The left anterior lower leg required debridement. Deep tissue culture I did of this wound showed MRSA I put him on 10 days of doxycycline which she will start today. We have him in compression wraps. He has a security card and [employer] however he is retiring on November 15. We will need to then get him into a better offloading boot for the left foot perhaps a total contact cast Objective Constitutional Sitting or standing Blood Pressure is within target range for patient.. Pulse regular and within target range for patient.Marland Kitchen Respirations regular, non-labored and within target range.. Temperature is normal and within the target range for the patient.Marland Kitchen Appears in no distress. Vitals Time Taken: 9:19 AM, Height: 74 in, Weight: 238 lbs, BMI: 30.6, Temperature: 98.1 F, Pulse: 77 bpm, Respiratory Rate: 18 breaths/min, Blood Pressure: 131/84 mmHg. Cardiovascular Pedal pulses palpable. Edema control is better. General Notes: Wound exam; left lateral leg much better looking wound surface is as depth. The area on the left anterior lower leg look better but still required debridement with a #5 curette to remove tightly adherent necrotic debris. Subcutaneous debridement. The area on the left first metatarsal head is hyper granulated I did not debride this today I am waiting for more aggressive offloading Integumentary (Hair, Skin) Wound #18 status is Open. Original cause of wound was Gradually Appeared. The date acquired was: 08/23/2020. The wound has been in treatment 2 weeks. The wound is located on the Left,Plantar Metatarsal head first. The wound measures 4.2cm length x 4.5cm width x 0.3cm depth; 14.844cm^2 area and 4.453cm^3 volume. There is Fat  Layer (Subcutaneous Tissue) exposed. There is a large amount of purulent drainage noted. The wound margin is distinct with the outline attached to the wound base. There is large (67-100%) red, pink granulation within the wound bed. There is a small (1-33%) amount of necrotic tissue within the wound bed including Adherent Slough. Wound #19 status is Open. Original cause of wound was Gradually Appeared. The date acquired was: 08/23/2020. The wound has been in treatment 2 weeks. The wound is located on the Left,Anterior Lower Leg. The wound measures 4.5cm length x 4.6cm width x 0.4cm depth; 16.258cm^2 area and 6.503cm^3 volume. There is Fat Layer (Subcutaneous Tissue) exposed. There is a large amount of purulent drainage noted. The wound margin is distinct with  the outline attached to the wound base. There is small (1-33%) granulation within the wound bed. There is a large (67-100%) amount of necrotic tissue within the wound bed including Adherent Slough. Wound #21 status is Open. Original cause of wound was Gradually Appeared. The date acquired was: 08/23/2020. The wound has been in treatment 2 weeks. The wound is located on the Left,Medial Lower Leg. The wound measures 2cm length x 1.3cm width x 0.5cm depth; 2.042cm^2 area and 1.021cm^3 volume. There is Fat Layer (Subcutaneous Tissue) exposed. There is a medium amount of purulent drainage noted. The wound margin is distinct with the outline attached to the wound base. There is medium (34-66%) red, pink granulation within the wound bed. There is a medium (34-66%) amount of necrotic tissue within the wound bed including Adherent Slough. 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 There was a Excisional Skin/Subcutaneous Tissue Debridement with a total area of 20.7 sq cm performed by Ricard Dillon., MD. With the following instrument(s): Curette to remove Viable and Non-Viable tissue/material. Material removed includes Subcutaneous Tissue and Slough and after achieving pain control using Lidocaine 5% topical ointment. No specimens were taken. A time out was conducted at 09:55, prior to the start of the procedure. A Minimum amount of bleeding was controlled with Pressure. The procedure was tolerated well with a pain level of 0 throughout and a pain level of 0 following the procedure. Post Debridement Measurements: 4.5cm length x 4.6cm width x 0.4cm depth; 6.503cm^3 volume. Character of Wound/Ulcer Post Debridement is improved. Severity of Tissue Post Debridement is: Fat layer exposed. Post procedure Diagnosis Wound #19: Same as Pre-Procedure Pre-procedure diagnosis of Wound #19 is a Diabetic Wound/Ulcer of the Lower Extremity located on the Left,Anterior Lower Leg . There was a Haematologist Compression Therapy Procedure by Baruch Gouty, RN. Post procedure Diagnosis Wound #19: Same as Pre-Procedure Plan 1. I am continuing with silver alginate to all wound areas with underlying gentamicin 2. He is now on doxycycline for 10 days for the MRSA I cultured out of the left anterior leg wound by deep tissue culture last week. 3. We are still wrapping his leg to control the swelling. 4. 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 This will eventually  need an aggressive debridement and a forefoot off loader possibly a total contact cast 5. He is retiring on November 15 fortunately this should give [place] a better chance to get all of these areas to close from a pressure area on his foot and permit edema point of view on the left leg Electronic Signature(s) Signed: 04/27/2021 5:02:06 PM By: Linton Ham MD Entered By: Linton Ham on 04/27/2021 10:12:05 -------------------------------------------------------------------------------- SuperBill Details Patient Name: Date of Service: [name]. 04/27/2021 Medical Record Number: [id number] Patient Account Number: [id number] Date of Birth/Sex: Treating RN: 1950-11-24 (70 y.o. 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 I89.0 Lymphedema, not elsewhere classified I87.322 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verity 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 of other part of left foot with other specified severity E11.42 Type 2 diabetes mellitus with diabetic polyneuropathy Facility Procedures CPT4 Code: 07573225 Description: 67209 - DEB SUBQ TISSUE 20 SQ CM/< ICD-10 Diagnosis Description 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 ulcer of other part of left lower leg with other specified Modifier: severity Quantity: 1 CPT4 Code: 19802217 Description: 11045 - DEB SUBQ TISS EA ADDL 20CM ICD-10 Diagnosis Description 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 ulcer of other part of left lower leg with other specified Modifier: severity Quantity: 1 Physician Procedures : CPT4 Code  Description Modifier [id number] 11042 - WC PHYS SUBQ TISS 20 SQ CM ICD-10 Diagnosis Description 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verity Quantity: 1 : [id number] 11045 - WC PHYS SUBQ TISS EA ADDL 20 CM ICD-10 Diagnosis Description 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verity Quantity: 1 Electronic Signature(s) Signed: 04/27/2021 5:02:06 PM By: Linton Ham MD Entered By: Linton Ham on 04/27/2021 10:12:37

## 2021-04-28 ENCOUNTER — Encounter (HOSPITAL_BASED_OUTPATIENT_CLINIC_OR_DEPARTMENT_OTHER): Payer: BC Managed Care – PPO | Admitting: Internal Medicine

## 2021-05-01 ENCOUNTER — Encounter (HOSPITAL_BASED_OUTPATIENT_CLINIC_OR_DEPARTMENT_OTHER): Payer: BC Managed Care – PPO | Admitting: Internal Medicine

## 2021-05-01 ENCOUNTER — Other Ambulatory Visit: Payer: Self-pay

## 2021-05-01 DIAGNOSIS — E11621 Type 2 diabetes mellitus with foot ulcer: Secondary | ICD-10-CM | POA: Diagnosis not present

## 2021-05-01 NOTE — Progress Notes (Signed)
[name], [name] ([id number]) Visit Report for 05/01/2021 Arrival Information Details Patient Name: Date of Service: [name], [name] 05/01/2021 9:45 A M Medical Record Number: [id number] Patient Account Number: [id number] Date of Birth/Sex: Treating RN: [birth year] (70 y.o. [name] Primary Care Ayona Yniguez: [name] Other Clinician: Referring Hanaan Gancarz: Treating Margerite Impastato/Extender: Cornelious Bryant in Treatment: 2 Visit Information History Since Last Visit Added or deleted any medications: No Patient Arrived: Ambulatory Any new allergies or adverse reactions: No Arrival Time: 10:10 Had a fall or experienced change in No Accompanied By: self activities of daily living that may affect Transfer Assistance: None risk of falls: Patient Identification Verified: Yes Signs or symptoms of abuse/neglect since last visito No Secondary Verification Process Completed: Yes Hospitalized since last visit: No Patient Requires Transmission-Based Precautions: No Implantable device outside of the clinic excluding No Patient Has Alerts: Yes cellular tissue based products placed in the center Patient Alerts: ABI's: 09/22 L:1.09 R: 1. since last visit: TBI's: R:0.72 L:0.99 Has Dressing in Place as Prescribed: Yes Pain Present Now: No Electronic Signature(s) Signed: 05/01/2021 3:53:30 PM By: Sandre Kitty Entered By: Sandre Kitty on 05/01/2021 10:10:26 -------------------------------------------------------------------------------- Compression Therapy Details Patient Name: Date of Service: [name]. 05/01/2021 9:45 A M Medical Record Number: [id number] Patient Account Number: [id number] Date of Birth/Sex: Treating RN: 11/01/1950 (70 y.o. [name] Primary Care Cambren Helm: [name] Other Clinician: Referring Lawernce Earll: Treating Tiffanni Scarfo/Extender: Cornelious Bryant in Treatment: 2 Compression Therapy Performed for Wound Assessment:  Wound #18 Left,Plantar Metatarsal head first Performed By: Clinician Lorrin Jackson, RN Compression Type: Rolena Infante Electronic Signature(s) Signed: 05/01/2021 10:51:59 AM By: Lorrin Jackson Entered By: Lorrin Jackson on 05/01/2021 10:51:59 -------------------------------------------------------------------------------- Compression Therapy Details Patient Name: Date of Service: [name]. 05/01/2021 9:45 A M Medical Record Number: [id number] Patient Account Number: [id number] Date of Birth/Sex: Treating RN: 09-23-1950 (70 y.o. [name] Primary Care Amahri Dengel: Other Clinician: [name] Referring Nayab Aten: Treating Roosvelt Churchwell/Extender: Cornelious Bryant in Treatment: 2 Compression Therapy Performed for Wound Assessment: Wound #19 Left,Anterior Lower Leg Performed By: Clinician Lorrin Jackson, RN Compression Type: Rolena Infante Electronic Signature(s) Signed: 05/01/2021 10:51:59 AM By: Lorrin Jackson Entered By: Lorrin Jackson on 05/01/2021 10:51:59 -------------------------------------------------------------------------------- Compression Therapy Details Patient Name: Date of Service: [name]. 05/01/2021 9:45 A M Medical Record Number: [id number] Patient Account Number: [id number] Date of Birth/Sex: Treating RN: May 19, 1951 (70 y.o. [name] Primary Care Kapil Petropoulos: [name] Other Clinician: Referring Reilynn Lauro: Treating Kwamaine Cuppett/Extender: Cornelious Bryant in Treatment: 2 Compression Therapy Performed for Wound Assessment: Wound #21 Left,Medial Lower Leg Performed By: Clinician Lorrin Jackson, RN Compression Type: Rolena Infante Electronic Signature(s) Signed: 05/01/2021 10:51:59 AM By: Lorrin Jackson Entered By: Lorrin Jackson on 05/01/2021 10:51:59 -------------------------------------------------------------------------------- Encounter Discharge Information Details Patient Name: Date of Service: [name].  05/01/2021 9:45 A M Medical Record Number: [id number] Patient Account Number: [id number] Date of Birth/Sex: Treating RN: 1950/11/20 (70 y.o. [name] Primary Care Janira Mandell: [name] Other Clinician: Referring Rosmery Duggin: Treating Brevon Dewald/Extender: Cornelious Bryant in Treatment: 2 Encounter Discharge Information Items Discharge Condition: Stable Ambulatory Status: Ambulatory Discharge Destination: Home Transportation: Private Auto Schedule Follow-up Appointment: Yes Clinical Summary of Care: Provided on 05/01/2021 Form Type Recipient Paper Patient Patient Electronic Signature(s) Signed: 05/01/2021 10:52:57 AM By: Lorrin Jackson Entered By: Lorrin Jackson on 05/01/2021 10:52:57 -------------------------------------------------------------------------------- Patient/Caregiver Education Details Patient Name: Date of Service: [name] 11/7/2022andnbsp9:45 [name]  Record Number: [id number] Patient Account Number: [id number] Date of Birth/Gender: Treating RN: April 05, 1951 (70 y.o. [name] Primary Care Physician: [name] Other Clinician: Referring Physician: Treating Physician/Extender: Cornelious Bryant in Treatment: 2 Education Assessment Education Provided To: Patient Education Topics Provided Venous: Methods: Explain/Verbal Responses: State content correctly Wound/Skin Impairment: Methods: Demonstration, Explain/Verbal Responses: State content correctly Electronic Signature(s) Signed: 05/01/2021 5:20:47 PM By: Lorrin Jackson Entered By: Lorrin Jackson on 05/01/2021 10:52:35 -------------------------------------------------------------------------------- Wound Assessment Details Patient Name: Date of Service: [name]. 05/01/2021 9:45 A M Medical Record Number: [id number] Patient Account Number: [id number] Date of Birth/Sex: Treating RN: 11-02-1950 (70 y.o. [name] Primary Care  Rennae Ferraiolo: [name] Other Clinician: Referring Gwenette Wellons: Treating Loletta Harper/Extender: Cornelious Bryant in Treatment: 2 Wound Status Wound Number: 18 Primary Etiology: Diabetic Wound/Ulcer of the Lower Extremity Wound Location: Left, Plantar Metatarsal head first Wound Status: Open Wounding Event: Gradually Appeared Date Acquired: 08/23/2020 Weeks Of Treatment: 2 Clustered Wound: No Wound Measurements Length: (cm) 4.2 Width: (cm) 4.5 Depth: (cm) 0.3 Area: (cm) 14.844 Volume: (cm) 4.453 % Reduction in Area: -5% % Reduction in Volume: -57.5% Wound Description Classification: Grade 2 Exudate Amount: Large Exudate Type: Purulent Exudate Color: yellow, brown, green Treatment Notes Wound #18 (Metatarsal head first) Wound Laterality: Plantar, Lef Cleanser Normal Saline Discharge Instruction: Cleanse the wound with Normal balls. Soap and Water Discharge Instruction: May shower and wash wound wit Wound Cleanser Discharge Instruction: Cleanse the wound with wound balls. t Saline prior to applying a clean dressing using gauze sponges, not tissue or cotton h dial antibacterial soap and water prior to dressing change. cleanser prior to applying a clean dressing using gauze sponges, not tissue or cotton Peri-Wound Care Triamcinolone 15 (g) Discharge Instruction: Use triamcinolone 15 (g) as directed Zinc Oxide Ointment 30g tube Discharge Instruction: Apply Zinc Oxide to periwound with each dressing change Sween Lotion (Moisturizing lotion) Discharge Instruction: Apply moisturizing lotion as directed Topical Gentamicin Discharge Instruction: As directed by physician Primary Dressing KerraCel Ag Gelling Fiber Dressing, 4x5 in (silver alginate) Discharge Instruction: Apply silver alginate to wound bed as instructed Secondary Dressing ABD Pad, 5x9 Discharge Instruction: Apply over primary dressing as directed. Zetuvit Plus 4x8 in Discharge Instruction:  Apply over primary dressing as directed. CarboFLEX Odor Control Dressing, 4x4 in Discharge Instruction: Apply over primary dressing as directed. Secured With Compression Wrap Unnaboot w/Calamine, 4x10 (in/yd) Discharge Instruction: Apply Unnaboot as directed. May also use Miliken CoFlex Calamine 2 layer compression system as alternative. Compression Stockings Add-Ons Electronic Signature(s) Signed: 05/01/2021 3:53:30 PM By: Sandre Kitty Signed: 05/01/2021 5:20:47 PM By: Lorrin Jackson Entered By: Sandre Kitty on 05/01/2021 10:10:59 -------------------------------------------------------------------------------- Wound Assessment Details Patient Name: Date of Service: [name]. 05/01/2021 9:45 A M Medical Record Number: [id number] Patient Account Number: [id number] Date of Birth/Sex: Treating RN: July 23, 1950 (70 y.o. [name] Primary Care Vihana Kydd: [name] Other Clinician: Referring Donnia Poplaski: Treating Samyak Sackmann/Extender: Cornelious Bryant in Treatment: 2 Wound Status Wound Number: 19 Primary Etiology: Diabetic Wound/Ulcer of the Lower Extremity Wound Location: Left, Anterior Lower Leg Wound Status: Open Wounding Event: Gradually Appeared Date Acquired: 08/23/2020 Weeks Of Treatment: 2 Clustered Wound: No Wound Measurements Length: (cm) 4.5 Width: (cm) 4.6 Depth: (cm) 0.4 Area: (cm) 16.258 Volume: (cm) 6.503 % Reduction in Area: -12.5% % Reduction in Volume: -50% Wound Description Classification: Grade 2 Exudate Amount: Large Exudate Type: Purulent Exudate Color: yellow, brown, green Treatment Notes Wound #19 (Lower Leg) Wound Laterality: Left,  Anterior Cleanser Normal Saline Discharge Instruction: Cleanse the wound with Normal Saline prior to applying a clean dressing using gauze sponges, not tissue or cotton balls. Soap and Water Discharge Instruction: May shower and wash wound with dial antibacterial soap and water prior  to dressing change. Wound Cleanser Discharge Instruction: Cleanse the wound with wound cleanser prior to applying a clean dressing using gauze sponges, not tissue or cotton balls. Peri-Wound Care Triamcinolone 15 (g) Discharge Instruction: Use triamcinolone 15 (g) as directed Sween Lotion (Moisturizing lotion) Discharge Instruction: Apply moisturizing lotion as directed Topical Gentamicin Discharge Instruction: As directed by physician Primary Dressing KerraCel Ag Gelling Fiber Dressing, 4x5 in (silver alginate) Discharge Instruction: Apply silver alginate to wound bed as instructed Secondary Dressing ABD Pad, 5x9 Discharge Instruction: Apply over primary dressing as directed. Zetuvit Plus 4x8 in Discharge Instruction: Apply over primary dressing as directed. CarboFLEX Odor Control Dressing, 4x4 in Discharge Instruction: Apply over primary dressing as directed. Secured With Compression Wrap Unnaboot w/Calamine, 4x10 (in/yd) Discharge Instruction: Apply Unnaboot as directed. May also use Miliken CoFlex Calamine 2 layer compression system as alternative. Compression Stockings Add-Ons Electronic Signature(s) Signed: 05/01/2021 3:53:30 PM By: Sandre Kitty Signed: 05/01/2021 5:20:47 PM By: Lorrin Jackson Entered By: Sandre Kitty on 05/01/2021 10:10:59 -------------------------------------------------------------------------------- Wound Assessment Details Patient Name: Date of Service: [name]. 05/01/2021 9:45 A M Medical Record Number: [id number] Patient Account Number: [id number] Date of Birth/Sex: Treating RN: April 11, 1951 (70 y.o. [name] Primary Care Casyn Becvar: [name] Other Clinician: Referring Lionardo Haze: Treating Nicklous Aburto/Extender: Cornelious Bryant in Treatment: 2 Wound Status Wound Number: 21 Primary Etiology: Diabetic Wound/Ulcer of the Lower Extremity Wound Location: Left, Medial Lower Leg Wound Status: Open Wounding  Event: Gradually Appeared Date Acquired: 08/23/2020 Weeks Of Treatment: 2 Clustered Wound: No Wound Measurements Length: (cm) 1 Width: (cm) 1.3 Depth: (cm) 0.5 Area: (cm) 1.021 Volume: (cm) 0.511 % Reduction in Area: 65.3% % Reduction in Volume: 42.2% Wound Description Classification: Grade 2 Exudate Amount: Medium Exudate Type: Purulent Exudate Color: yellow, brown, green Treatment Notes Wound #21 (Lower Leg) Wound Laterality: Left, Medial Cleanser Normal Saline Discharge Instruction: Cleanse the wound with Normal Saline prior to applying a clean dressing using gauze sponges, not tissue or cotton balls. Soap and Water Discharge Instruction: May shower and wash wound with dial antibacterial soap and water prior to dressing change. Wound Cleanser Discharge Instruction: Cleanse the wound with wound cleanser prior to applying a clean dressing using gauze sponges, not tissue or cotton balls. Peri-Wound Care Triamcinolone 15 (g) Discharge Instruction: Use triamcinolone 15 (g) as directed Sween Lotion (Moisturizing lotion) Discharge Instruction: Apply moisturizing lotion as directed Topical Gentamicin Discharge Instruction: As directed by physician Primary Dressing KerraCel Ag Gelling Fiber Dressing, 4x5 in (silver alginate) Discharge Instruction: Apply silver alginate to wound bed as instructed Secondary Dressing ABD Pad, 5x9 Discharge Instruction: Apply over primary dressing as directed. Zetuvit Plus 4x8 in Discharge Instruction: Apply over primary dressing as directed. CarboFLEX Odor Control Dressing, 4x4 in Discharge Instruction: Apply over primary dressing as directed. Secured With Compression Wrap Unnaboot w/Calamine, 4x10 (in/yd) Discharge Instruction: Apply Unnaboot as directed. May also use Miliken CoFlex Calamine 2 layer compression system as alternative. Compression Stockings Add-Ons Electronic Signature(s) Signed: 05/01/2021 3:53:30 PM By: Sandre Kitty Signed: 05/01/2021 5:20:47 PM By: Lorrin Jackson Entered By: Sandre Kitty on 05/01/2021 10:10:59 -------------------------------------------------------------------------------- Vitals Details Patient Name: Date of Service: [name]. 05/01/2021 9:45 A M Medical Record Number: [id number] Patient Account Number: [id number] Date of  Birth/Sex: Treating RN: 12-31-1950 (70 y.o. [name] Primary Care Nekeya Briski: [name] Other Clinician: Referring Torah Pinnock: Treating Deasha Clendenin/Extender: Cornelious Bryant in Treatment: 2 Vital Signs Time Taken: 10:10 Temperature (F): 98.1 Height (in): 74 Pulse (bpm): 97 Weight (lbs): 238 Respiratory Rate (breaths/min): 18 Body Mass Index (BMI): 30.6 Blood Pressure (mmHg): 153/78 Reference Range: 80 - 120 mg / dl Electronic Signature(s) Signed: 05/01/2021 3:53:30 PM By: Sandre Kitty Entered By: Sandre Kitty on 05/01/2021 10:10:42

## 2021-05-01 NOTE — Progress Notes (Signed)
[name], OMDAHL ([id number]) Visit Report for 05/01/2021 SuperBill Details Patient Name: Date of Service: [name], [name] 05/01/2021 Medical Record Number: [id number] Patient Account Number: [id number] Date of Birth/Sex: Treating RN: Jun 11, 1951 (70 y.o. 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 I89.0 Lymphedema, not elsewhere classified I87.322 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verity 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 of other part of left foot with other specified severity E11.42 Type 2 diabetes mellitus with diabetic polyneuropathy Facility Procedures CPT4 Code Description Modifier Quantity [id number] (Facility Use Only) 29580LT - APPLY UNNA BOOT LT 1 ICD-10 Diagnosis Description 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verity I89.0 Lymphedema, not elsewhere classified Electronic Signature(s) Signed: 05/01/2021 10:53:22 AM By: Lorrin Jackson Signed: 05/01/2021 12:57:49 PM By: Kalman Shan DO Entered By: Lorrin Jackson on 05/01/2021 10:53:21

## 2021-05-04 ENCOUNTER — Encounter (HOSPITAL_BASED_OUTPATIENT_CLINIC_OR_DEPARTMENT_OTHER): Payer: BC Managed Care – PPO | Admitting: Internal Medicine

## 2021-05-04 DIAGNOSIS — E11621 Type 2 diabetes mellitus with foot ulcer: Secondary | ICD-10-CM | POA: Diagnosis not present

## 2021-05-04 NOTE — Progress Notes (Signed)
RAKESH, DUTKO ([id number]) Visit Report for 05/04/2021 Arrival Information Details Patient Name: Date of Service: BEATRICE, SEHGAL 05/04/2021 9:15 A M Medical Record Number: [id number] Patient Account Number: [id number] Date of Birth/Sex: Treating RN: 06-Nov-1950 (70 y.o. [name], [name] Primary Care Brihanna Devenport: [name] Other Clinician: Referring Kyrstal Monterrosa: Treating Lovelyn Sheeran/Extender: Stormy Card in Treatment: 3 Visit Information History Since Last Visit Added or deleted any medications: No Patient Arrived: Ambulatory Any new allergies or adverse reactions: No Arrival Time: 09:35 Had a fall or experienced change in No Accompanied By: self activities of daily living that may affect Transfer Assistance: None risk of falls: Patient Identification Verified: Yes Signs or symptoms of abuse/neglect since last visito No Secondary Verification Process Completed: Yes Hospitalized since last visit: No Patient Requires Transmission-Based Precautions: No Implantable device outside of the clinic excluding No Patient Has Alerts: Yes cellular tissue based products placed in the center Patient Alerts: ABI's: 09/22 L:1.09 R: 1. since last visit: TBI's: R:0.72 L:0.99 Has Dressing in Place as Prescribed: Yes Has Compression in Place as Prescribed: Yes Pain Present Now: Yes Electronic Signature(s) Signed: 05/04/2021 5:16:08 PM By: Deon Pilling RN, BSN Entered By: Deon Pilling on 05/04/2021 09:41:03 -------------------------------------------------------------------------------- Compression Therapy Details Patient Name: Date of Service: [name] 05/04/2021 9:15 A M Medical Record Number: [id number] Patient Account Number: [id number] Date of Birth/Sex: Treating RN: 1950-08-25 (70 y.o. [name] Primary Care Christa Fasig: [name] Other Clinician: Referring Kynsley Whitehouse: Treating [place] Dalporto/Extender: Stormy Card in Treatment:  3 Compression Therapy Performed for Wound Assessment: Wound #18 Left,Plantar Metatarsal head first Performed By: Clinician Deon Pilling, RN Compression Type: Rolena Infante Post Procedure Diagnosis Same as Pre-procedure Electronic Signature(s) Signed: 05/04/2021 5:16:08 PM By: Deon Pilling RN, BSN Entered By: Deon Pilling on 05/04/2021 10:02:22 -------------------------------------------------------------------------------- Compression Therapy Details Patient Name: Date of Service: [name] 05/04/2021 9:15 A M Medical Record Number: [id number] Patient Account Number: [id number] Date of Birth/Sex: Treating RN: July 18, 1950 (70 y.o. [name] Primary Care Taggart Prasad: [name] Other Clinician: Referring Alijah Akram: Treating Chelsy Parrales/Extender: Stormy Card in Treatment: 3 Compression Therapy Performed for Wound Assessment: Wound #21 Left,Medial Lower Leg Performed By: Clinician Deon Pilling, RN Compression Type: Rolena Infante Post Procedure Diagnosis Same as Pre-procedure Electronic Signature(s) Signed: 05/04/2021 5:16:08 PM By: Deon Pilling RN, BSN Entered By: Deon Pilling on 05/04/2021 10:02:22 -------------------------------------------------------------------------------- Compression Therapy Details Patient Name: Date of Service: [name] 05/04/2021 9:15 A M Medical Record Number: [id number] Patient Account Number: [id number] Date of Birth/Sex: Treating RN: [birth year] (70 y.o. [name] Primary Care Vivan Agostino: [name] Other Clinician: Referring Nalla Purdy: Treating Tinley Rought/Extender: Stormy Card in Treatment: 3 Compression Therapy Performed for Wound Assessment: Wound #19 Left,Anterior Lower Leg Performed By: Clinician Deon Pilling, RN Compression Type: Rolena Infante Post Procedure Diagnosis Same as Pre-procedure Electronic Signature(s) Signed: 05/04/2021 5:16:08 PM By: Deon Pilling RN, BSN Entered  By: Deon Pilling on 05/04/2021 10:02:22 -------------------------------------------------------------------------------- Encounter Discharge Information Details Patient Name: Date of Service: [name] 05/04/2021 9:15 A M Medical Record Number: [id number] Patient Account Number: [id number] Date of Birth/Sex: Treating RN: 16-Nov-1950 (70 y.o. [name] Primary Care Valborg Friar: [name] Other Clinician: Referring Ranette Luckadoo: Treating Alina Gilkey/Extender: Stormy Card in Treatment: 3 Encounter Discharge Information Items Post Procedure Vitals Discharge Condition: Stable Temperature (F): 98.5 Ambulatory Status: Ambulatory Pulse (bpm): 80 Discharge Destination: Home Respiratory Rate (breaths/min): 20 Transportation: Private Auto Blood Pressure (mmHg): 161/78  Accompanied By: self Schedule Follow-up Appointment: Yes Clinical Summary of Care: Electronic Signature(s) Signed: 05/04/2021 5:16:08 PM By: Deon Pilling RN, BSN Entered By: Deon Pilling on 05/04/2021 10:13:22 -------------------------------------------------------------------------------- Lower Extremity Assessment Details Patient Name: Date of Service: [name] 05/04/2021 9:15 A M Medical Record Number: [id number] Patient Account Number: [id number] Date of Birth/Sex: Treating RN: [birth year] (70 y.o. [name] Primary Care Hyland Mollenkopf: [name] Other Clinician: Referring Arelie Kuzel: Treating Idrissa Beville/Extender: Stormy Card in Treatment: 3 Edema Assessment Assessed: [name]: Yes] [name]: No] Edema: [Left: Ye] [Right: s] Calf Left: Right: Point of Measurement: 41 cm From Medial Instep 46 cm Ankle Left: Right: Point of Measurement: 10 cm From Medial Instep 28 cm Vascular Assessment Pulses: Dorsalis Pedis Palpable: [Left:Yes] Electronic Signature(s) Signed: 05/04/2021 5:16:08 PM By: Deon Pilling RN, BSN Entered By: Deon Pilling on 05/04/2021  09:42:48 -------------------------------------------------------------------------------- Multi Wound Chart Details Patient Name: Date of Service: [name] 05/04/2021 9:15 A M Medical Record Number: [id number] Patient Account Number: [id number] Date of Birth/Sex: Treating RN: 08/02/1950 (70 y.o. [name], [name] Primary Care Clarann Helvey: [name] Other Clinician: Referring Zaleah Ternes: Treating Lateef Juncaj/Extender: Stormy Card in Treatment: 3 Vital Signs Height(in): 37 Pulse(bpm): 33 Weight(lbs): 62 Blood Pressure(mmHg): 161/78 Body Mass Index(BMI): 31 Temperature(F): 98.5 Respiratory Rate(breaths/min): 20 Photos: Left, Plantar Metatarsal head first Left, Anterior Lower Leg Left, Medial Lower Leg Wound Location: Gradually Appeared Gradually Appeared Gradually Appeared Wounding Event: Diabetic Wound/Ulcer of the Lower Diabetic Wound/Ulcer of the Lower Diabetic Wound/Ulcer of the Lower Primary Etiology: Extremity Extremity Extremity Glaucoma, Sleep Apnea, Glaucoma, Sleep Apnea, Glaucoma, Sleep Apnea, Comorbid History: Hypertension, Peripheral Arterial Hypertension, Peripheral Arterial Hypertension, Peripheral Arterial Disease, Peripheral Venous Disease, Disease, Peripheral Venous Disease, Disease, Peripheral Venous Disease, Type II Diabetes, Gout, Osteoarthritis, Type II Diabetes, Gout, Osteoarthritis, Type II Diabetes, Gout, Osteoarthritis, Neuropathy Neuropathy Neuropathy 08/23/2020 08/23/2020 08/23/2020 Date Acquired: _0 Weeks of Treatment: Open Open Open Wound Status: 4x5x0.1 4.5x3.7x0.3 1.9x1x0.4 Measurements L x W x D (cm) 15.708 13.077 1.492 A (cm) : rea 1.571 3.923 0.597 Volume (cm) : -11.10% 9.50% 49.30% % Reduction in A rea: 44.40% 9.50% 32.50% % Reduction in Volume: Grade 2 Grade 2 Grade 2 Classification: Large Large Medium Exudate A mount: Serosanguineous Purulent Purulent Exudate Type: red, brown yellow, brown, green  yellow, brown, green Exudate Color: No Yes No Foul Odor A Cleansing: fter N/A No N/A Odor A nticipated Due to Product Use: Distinct, outline attached Distinct, outline attached Distinct, outline attached Wound Margin: Large (67-100%) None Present (0%) Small (1-33%) Granulation A mount: Red, Pink, Hyper-granulation N/A Red Granulation Quality: None Present (0%) Large (67-100%) Large (67-100%) Necrotic A mount: Fat Layer (Subcutaneous Tissue): Yes Fat Layer (Subcutaneous Tissue): Yes Fat Layer (Subcutaneous Tissue): Yes Exposed Structures: Fascia: No Fascia: No Fascia: No Tendon: No Tendon: No Tendon: No Muscle: No Muscle: No Muscle: No Joint: No Joint: No Joint: No Bone: No Bone: No Bone: No Small (1-33%) Small (1-33%) None Epithelialization: Debridement - Excisional Debridement - Excisional N/A Debridement: Pre-procedure Verification/Time Out 09:50 09:50 N/A Taken: Other Other N/A Pain Control: Subcutaneous, Slough Subcutaneous, Slough N/A Tissue Debrided: Skin/Subcutaneous Tissue Skin/Subcutaneous Tissue N/A Level: 20 16.65 N/A Debridement A (sq cm): rea Blade Curette N/A Instrument: Moderate Moderate N/A Bleeding: Silver Nitrate Pressure N/A Hemostasis A chieved: 1 1 N/A Procedural Pain: 4 4 N/A Post Procedural Pain: Procedure was tolerated well Procedure was tolerated well N/A Debridement Treatment Response: 4x5x0.1 4.5x3.7x0.3 N/A Post Debridement Measurements L x W x  D (cm) 1.571 3.923 N/A Post Debridement Volume: (cm) Compression Therapy Compression Therapy Compression Therapy Procedures Performed: Debridement Debridement Treatment Notes Wound #18 (Metatarsal head first) Wound Laterality: Plantar, Left Cleanser Normal Saline Discharge Instruction: Cleanse the wound with Normal Saline prior to applying a clean dressing using gauze sponges, not tissue or cotton balls. Soap and Water Discharge Instruction: May shower and wash wound with  dial antibacterial soap and water prior to dressing change. Wound Cleanser Discharge Instruction: Cleanse the wound with wound cleanser prior to applying a clean dressing using gauze sponges, not tissue or cotton balls. Peri-Wound Care Triamcinolone 15 (g) Discharge Instruction: Use triamcinolone 15 (g) as directed Zinc Oxide Ointment 30g tube Discharge Instruction: Apply Zinc Oxide to periwound with each dressing change Sween Lotion (Moisturizing lotion) Discharge Instruction: Apply moisturizing lotion as directed Topical Primary Dressing Hydrofera Blue Classic Foam, 4x4 in Discharge Instruction: Moisten with saline prior to applying to wound bed Secondary Dressing ABD Pad, 5x9 Discharge Instruction: Apply over primary dressing as directed. Zetuvit Plus 4x8 in Discharge Instruction: Apply over primary dressing as directed. CarboFLEX Odor Control Dressing, 4x4 in Discharge Instruction: Apply over primary dressing as directed. Secured With Compression Wrap Unnaboot w/Calamine, 4x10 (in/yd) Discharge Instruction: Apply Unnaboot as directed. May also use Miliken CoFlex Calamine 2 layer compression system as alternative. Compression Stockings Add-Ons Wound #19 (Lower Leg) Wound Laterality: Left, Anterior Cleanser Normal Saline Discharge Instruction: Cleanse the wound with Normal Saline prior to applying a clean dressing using gauze sponges, not tissue or cotton balls. Soap and Water Discharge Instruction: May shower and wash wound with dial antibacterial soap and water prior to dressing change. Wound Cleanser Discharge Instruction: Cleanse the wound with wound cleanser prior to applying a clean dressing using gauze sponges, not tissue or cotton balls. Peri-Wound Care Triamcinolone 15 (g) Discharge Instruction: Use triamcinolone 15 (g) as directed Zinc Oxide Ointment 30g tube Discharge Instruction: Apply Zinc Oxide to periwound with each dressing change Sween Lotion (Moisturizing  lotion) Discharge Instruction: Apply moisturizing lotion as directed Topical Primary Dressing Hydrofera Blue Classic Foam, 4x4 in Discharge Instruction: Moisten with saline prior to applying to wound bed Secondary Dressing ABD Pad, 5x9 Discharge Instruction: Apply over primary dressing as directed. Zetuvit Plus 4x8 in Discharge Instruction: Apply over primary dressing as directed. CarboFLEX Odor Control Dressing, 4x4 in Discharge Instruction: Apply over primary dressing as directed. Secured With Compression Wrap Unnaboot w/Calamine, 4x10 (in/yd) Discharge Instruction: Apply Unnaboot as directed. May also use Miliken CoFlex Calamine 2 layer compression system as alternative. Compression Stockings Add-Ons Wound #21 (Lower Leg) Wound Laterality: Left, Medial Cleanser Normal Saline Discharge Instruction: Cleanse the wound with Normal Saline prior to applying a clean dressing using gauze sponges, not tissue or cotton balls. Soap and Water Discharge Instruction: May shower and wash wound with dial antibacterial soap and water prior to dressing change. Wound Cleanser Discharge Instruction: Cleanse the wound with wound cleanser prior to applying a clean dressing using gauze sponges, not tissue or cotton balls. Peri-Wound Care Triamcinolone 15 (g) Discharge Instruction: Use triamcinolone 15 (g) as directed Zinc Oxide Ointment 30g tube Discharge Instruction: Apply Zinc Oxide to periwound with each dressing change Sween Lotion (Moisturizing lotion) Discharge Instruction: Apply moisturizing lotion as directed Topical Primary Dressing Hydrofera Blue Classic Foam, 4x4 in Discharge Instruction: Moisten with saline prior to applying to wound bed Secondary Dressing ABD Pad, 5x9 Discharge Instruction: Apply over primary dressing as directed. Zetuvit Plus 4x8 in Discharge Instruction: Apply over primary dressing as directed. CarboFLEX Odor Control Dressing,  4x4 in Discharge Instruction:  Apply over primary dressing as directed. Secured With Compression Wrap Unnaboot w/Calamine, 4x10 (in/yd) Discharge Instruction: Apply Unnaboot as directed. May also use Miliken CoFlex Calamine 2 layer compression system as alternative. Compression Stockings Add-Ons Electronic Signature(s) Signed: 05/04/2021 5:10:51 PM By: Linton Ham MD Signed: 05/04/2021 5:16:08 PM By: Deon Pilling RN, BSN Entered By: Linton Ham on 05/04/2021 10:17:41 -------------------------------------------------------------------------------- Multi-Disciplinary Care Plan Details Patient Name: Date of Service: [name] 05/04/2021 9:15 A M Medical Record Number: [id number] Patient Account Number: [id number] Date of Birth/Sex: Treating RN: 1951/04/29 (70 y.o. [name] Primary Care Erastus Bartolomei: [name] Other Clinician: Referring Odessa Morren: Treating Bobbiejo Ishikawa/Extender: Stormy Card in Treatment: 3 Multidisciplinary Care Plan reviewed with physician Active Inactive Soft Tissue Infection Nursing Diagnoses: Impaired tissue integrity Potential for infection: soft tissue Goals: Patient's soft tissue infection will resolve Date Initiated: 04/20/2021 Target Resolution Date: 06/09/2021 Goal Status: Active Interventions: Assess signs and symptoms of infection every visit Provide education on infection Treatment Activities: Culture : 04/20/2021 Education provided on Infection : 04/27/2021 Systemic antibiotics : 04/20/2021 T ordered outside of clinic : 04/20/2021 est Notes: Wound/Skin Impairment Nursing Diagnoses: Impaired tissue integrity Knowledge deficit related to ulceration/compromised skin integrity Goals: Patient will have a decrease in wound volume by X% from date: (specify in notes) Date Initiated: 04/12/2021 Date Inactivated: 04/27/2021 Target Resolution Date: 04/23/2021 Goal Status: Met Patient/caregiver will verbalize understanding of skin care  regimen Date Initiated: 04/12/2021 Target Resolution Date: 05/25/2021 Goal Status: Active Ulcer/skin breakdown will have a volume reduction of 30% by week 4 Date Initiated: 04/12/2021 Date Inactivated: 04/27/2021 Target Resolution Date: 04/27/2021 Goal Status: Unmet Unmet Reason: infection Ulcer/skin breakdown will have a volume reduction of 50% by week 8 Date Initiated: 04/27/2021 Target Resolution Date: 05/25/2021 Goal Status: Active Interventions: Assess patient/caregiver ability to obtain necessary supplies Assess patient/caregiver ability to perform ulcer/skin care regimen upon admission and as needed Assess ulceration(s) every visit Notes: Electronic Signature(s) Signed: 05/04/2021 5:16:08 PM By: Deon Pilling RN, BSN Entered By: Deon Pilling on 05/04/2021 09:48:06 -------------------------------------------------------------------------------- Pain Assessment Details Patient Name: Date of Service: [name] 05/04/2021 9:15 A M Medical Record Number: [id number] Patient Account Number: [id number] Date of Birth/Sex: Treating RN: 21-Nov-1950 (70 y.o. [name] Primary Care Deniel Mcquiston: [name] Other Clinician: Referring Haik Mahoney: Treating Quynh Basso/Extender: Stormy Card in Treatment: 3 Active Problems Location of Pain Severity and Description of Pain Patient Has Paino Yes Site Locations Pain Location: Pain Location: Pain in Ulcers Rate the pain. Current Pain Level: 6 Worst Pain Level: 10 Least Pain Level: 0 Tolerable Pain Level: 8 Pain Management and Medication Current Pain Management: Medication: No Cold Application: No Rest: No Massage: No Activity: No T.E.N.S.: No Heat Application: No Leg drop or elevation: No Is the Current Pain Management Adequate: Adequate How does your wound impact your activities of daily livingo Sleep: No Bathing: No Appetite: No Relationship With Others: No Bladder Continence: No Emotions:  No Bowel Continence: No Work: No Toileting: No Drive: No Dressing: No Hobbies: No Engineer, maintenance) Signed: 05/04/2021 5:16:08 PM By: Deon Pilling RN, BSN Entered By: Deon Pilling on 05/04/2021 09:41:46 -------------------------------------------------------------------------------- Patient/Caregiver Education Details Patient Name: Date of Service: [name] 11/10/2022andnbsp9:15 A M Medical Record Number: [id number] Patient Account Number: [id number] Date of Birth/Gender: Treating RN: 05-10-1951 (70 y.o. [name] Primary Care Physician: [name] Other Clinician: Referring Physician: Treating Physician/Extender: Stormy Card in Treatment: 3 Education Assessment Education Provided  To: Patient Education Topics Provided Wound/Skin Impairment: Handouts: Skin Care Do's and Dont's Methods: Explain/Verbal Responses: Reinforcements needed Electronic Signature(s) Signed: 05/04/2021 5:16:08 PM By: Deon Pilling RN, BSN Entered By: Deon Pilling on 05/04/2021 09:48:18 -------------------------------------------------------------------------------- Wound Assessment Details Patient Name: Date of Service: [name] 05/04/2021 9:15 A M Medical Record Number: [id number] Patient Account Number: [id number] Date of Birth/Sex: Treating RN: 1951-01-20 (70 y.o. [name], [name] Primary Care Provider: [name] Other Clinician: Referring Provider: Treating Provider/Extender: Stormy Card in Treatment: 3 Wound Status Wound Number: 18 Primary Diabetic Wound/Ulcer of the Lower Extremity Etiology: Wound Location: Left, Plantar Metatarsal head first Wound Open Wounding Event: Gradually Appeared Status: Date Acquired: 08/23/2020 Comorbid Glaucoma, Sleep Apnea, Hypertension, Peripheral Arterial Disease, Weeks Of Treatment: 3 History: Peripheral Venous Disease, Type II Diabetes, Gout, Osteoarthritis, Clustered  Wound: No Neuropathy Photos Wound Measurements Length: (cm) 4 Width: (cm) 5 Depth: (cm) 0.1 Area: (cm) 15.708 Volume: (cm) 1.571 % Reduction in Area: -11.1% % Reduction in Volume: 44.4% Epithelialization: Small (1-33%) Tunneling: No Undermining: No Wound Description Classification: Grade 2 Wound Margin: Distinct, outline attached Exudate Amount: Large Exudate Type: Serosanguineous Exudate Color: red, brown Foul Odor After Cleansing: No Slough/Fibrino No Wound Bed Granulation Amount: Large (67-100%) Exposed Structure Granulation Quality: Red, Pink, Hyper-granulation Fascia Exposed: No Necrotic Amount: None Present (0%) Fat Layer (Subcutaneous Tissue) Exposed: Yes Tendon Exposed: No Muscle Exposed: No Joint Exposed: No Bone Exposed: No Treatment Notes Wound #18 (Metatarsal head first) Wound Laterality: Plantar, Left Cleanser Normal Saline Discharge Instruction: Cleanse the wound with Normal Saline prior to applying a clean dressing using gauze sponges, not tissue or cotton balls. Soap and Water Discharge Instruction: May shower and wash wound with dial antibacterial soap and water prior to dressing change. Wound Cleanser Discharge Instruction: Cleanse the wound with wound cleanser prior to applying a clean dressing using gauze sponges, not tissue or cotton balls. Peri-Wound Care Triamcinolone 15 (g) Discharge Instruction: Use triamcinolone 15 (g) as directed Zinc Oxide Ointment 30g tube Discharge Instruction: Apply Zinc Oxide to periwound with each dressing change Sween Lotion (Moisturizing lotion) Discharge Instruction: Apply moisturizing lotion as directed Topical Primary Dressing Hydrofera Blue Classic Foam, 4x4 in Discharge Instruction: Moisten with saline prior to applying to wound bed Secondary Dressing ABD Pad, 5x9 Discharge Instruction: Apply over primary dressing as directed. Zetuvit Plus 4x8 in Discharge Instruction: Apply over primary dressing as  directed. CarboFLEX Odor Control Dressing, 4x4 in Discharge Instruction: Apply over primary dressing as directed. Secured With Compression Wrap Unnaboot w/Calamine, 4x10 (in/yd) Discharge Instruction: Apply Unnaboot as directed. May also use Miliken CoFlex Calamine 2 layer compression system as alternative. Compression Stockings Add-Ons Electronic Signature(s) Signed: 05/04/2021 5:16:08 PM By: Deon Pilling RN, BSN Entered By: Deon Pilling on 05/04/2021 09:49:28 -------------------------------------------------------------------------------- Wound Assessment Details Patient Name: Date of Service: [name] 05/04/2021 9:15 A M Medical Record Number: [id number] Patient Account Number: [id number] Date of Birth/Sex: Treating RN: [birth year] (70 y.o. [name], [name] Primary Care Provider: [name] Other Clinician: Referring Provider: Treating Provider/Extender: Stormy Card in Treatment: 3 Wound Status Wound Number: 19 Primary Diabetic Wound/Ulcer of the Lower Extremity Etiology: Wound Location: Left, Anterior Lower Leg Wound Open Wounding Event: Gradually Appeared Status: Date Acquired: 08/23/2020 Comorbid Glaucoma, Sleep Apnea, Hypertension, Peripheral Arterial Disease, Weeks Of Treatment: 3 History: Peripheral Venous Disease, Type II Diabetes, Gout, Osteoarthritis, Clustered Wound: No Neuropathy Photos Wound Measurements Length: (cm) 4.5 Width: (cm) 3.7 Depth: (cm) 0.3 Area: (cm) 13.077 Volume: (  cm) 3.923 % Reduction in Area: 9.5% % Reduction in Volume: 9.5% Epithelialization: Small (1-33%) Tunneling: No Undermining: No Wound Description Classification: Grade 2 Wound Margin: Distinct, outline attached Exudate Amount: Large Exudate Type: Purulent Exudate Color: yellow, brown, green Foul Odor After Cleansing: Yes Due to Product Use: No Slough/Fibrino Yes Wound Bed Granulation Amount: None Present (0%) Exposed Structure Necrotic  Amount: Large (67-100%) Fascia Exposed: No Necrotic Quality: Adherent Slough Fat Layer (Subcutaneous Tissue) Exposed: Yes Tendon Exposed: No Muscle Exposed: No Joint Exposed: No Bone Exposed: No Treatment Notes Wound #19 (Lower Leg) Wound Laterality: Left, Anterior Cleanser Normal Saline Discharge Instruction: Cleanse the wound with Normal Saline prior to applying a clean dressing using gauze sponges, not tissue or cotton balls. Soap and Water Discharge Instruction: May shower and wash wound with dial antibacterial soap and water prior to dressing change. Wound Cleanser Discharge Instruction: Cleanse the wound with wound cleanser prior to applying a clean dressing using gauze sponges, not tissue or cotton balls. Peri-Wound Care Triamcinolone 15 (g) Discharge Instruction: Use triamcinolone 15 (g) as directed Zinc Oxide Ointment 30g tube Discharge Instruction: Apply Zinc Oxide to periwound with each dressing change Sween Lotion (Moisturizing lotion) Discharge Instruction: Apply moisturizing lotion as directed Topical Primary Dressing Hydrofera Blue Classic Foam, 4x4 in Discharge Instruction: Moisten with saline prior to applying to wound bed Secondary Dressing ABD Pad, 5x9 Discharge Instruction: Apply over primary dressing as directed. Zetuvit Plus 4x8 in Discharge Instruction: Apply over primary dressing as directed. CarboFLEX Odor Control Dressing, 4x4 in Discharge Instruction: Apply over primary dressing as directed. Secured With Compression Wrap Unnaboot w/Calamine, 4x10 (in/yd) Discharge Instruction: Apply Unnaboot as directed. May also use Miliken CoFlex Calamine 2 layer compression system as alternative. Compression Stockings Add-Ons Electronic Signature(s) Signed: 05/04/2021 5:16:08 PM By: Deon Pilling RN, BSN Entered By: Deon Pilling on 05/04/2021 09:49:55 -------------------------------------------------------------------------------- Wound Assessment  Details Patient Name: Date of Service: [name] 05/04/2021 9:15 A M Medical Record Number: [id number] Patient Account Number: [id number] Date of Birth/Sex: Treating RN: 1951/04/01 (70 y.o. [name], [name] Primary Care Provider: [name] Other Clinician: Referring Provider: Treating Provider/Extender: Stormy Card in Treatment: 3 Wound Status Wound Number: 21 Primary Diabetic Wound/Ulcer of the Lower Extremity Etiology: Wound Location: Left, Medial Lower Leg Wound Open Wounding Event: Gradually Appeared Status: Date Acquired: 08/23/2020 Comorbid Glaucoma, Sleep Apnea, Hypertension, Peripheral Arterial Disease, Weeks Of Treatment: 3 History: Peripheral Venous Disease, Type II Diabetes, Gout, Osteoarthritis, Clustered Wound: No Neuropathy Photos Wound Measurements Length: (cm) 1.9 Width: (cm) 1 Depth: (cm) 0.4 Area: (cm) 1.492 Volume: (cm) 0.597 % Reduction in Area: 49.3% % Reduction in Volume: 32.5% Epithelialization: None Tunneling: No Undermining: No Wound Description Classification: Grade 2 Wound Margin: Distinct, outline attached Exudate Amount: Medium Exudate Type: Purulent Exudate Color: yellow, brown, green Foul Odor After Cleansing: No Slough/Fibrino Yes Wound Bed Granulation Amount: Small (1-33%) Exposed Structure Granulation Quality: Red Fascia Exposed: No Necrotic Amount: Large (67-100%) Fat Layer (Subcutaneous Tissue) Exposed: Yes Necrotic Quality: Adherent Slough Tendon Exposed: No Muscle Exposed: No Joint Exposed: No Bone Exposed: No Treatment Notes Wound #21 (Lower Leg) Wound Laterality: Left, Medial Cleanser Normal Saline Discharge Instruction: Cleanse the wound with Normal Saline prior to applying a clean dressing using gauze sponges, not tissue or cotton balls. Soap and Water Discharge Instruction: May shower and wash wound with dial antibacterial soap and water prior to dressing change. Wound  Cleanser Discharge Instruction: Cleanse the wound with wound cleanser prior to applying a clean dressing  using gauze sponges, not tissue or cotton balls. Peri-Wound Care Triamcinolone 15 (g) Discharge Instruction: Use triamcinolone 15 (g) as directed Zinc Oxide Ointment 30g tube Discharge Instruction: Apply Zinc Oxide to periwound with each dressing change Sween Lotion (Moisturizing lotion) Discharge Instruction: Apply moisturizing lotion as directed Topical Primary Dressing Hydrofera Blue Classic Foam, 4x4 in Discharge Instruction: Moisten with saline prior to applying to wound bed Secondary Dressing ABD Pad, 5x9 Discharge Instruction: Apply over primary dressing as directed. Zetuvit Plus 4x8 in Discharge Instruction: Apply over primary dressing as directed. CarboFLEX Odor Control Dressing, 4x4 in Discharge Instruction: Apply over primary dressing as directed. Secured With Compression Wrap Unnaboot w/Calamine, 4x10 (in/yd) Discharge Instruction: Apply Unnaboot as directed. May also use Miliken CoFlex Calamine 2 layer compression system as alternative. Compression Stockings Add-Ons Electronic Signature(s) Signed: 05/04/2021 5:16:08 PM By: Deon Pilling RN, BSN Entered By: Deon Pilling on 05/04/2021 09:50:23 -------------------------------------------------------------------------------- Vitals Details Patient Name: Date of Service: [name] 05/04/2021 9:15 A M Medical Record Number: [id number] Patient Account Number: [id number] Date of Birth/Sex: Treating RN: March 27, 1951 (70 y.o. [name], [name] Primary Care Jobe Mutch: [name] Other Clinician: Referring Fredrico Beedle: Treating Lexton Hidalgo/Extender: Stormy Card in Treatment: 3 Vital Signs Time Taken: 09:35 Temperature (F): 98.5 Height (in): 74 Pulse (bpm): 80 Weight (lbs): 238 Respiratory Rate (breaths/min): 20 Body Mass Index (BMI): 30.6 Blood Pressure (mmHg): 161/78 Reference Range: 80  - 120 mg / dl Electronic Signature(s) Signed: 05/04/2021 5:16:08 PM By: Deon Pilling RN, BSN Entered By: Deon Pilling on 05/04/2021 09:41:24

## 2021-05-04 NOTE — Progress Notes (Signed)
COSIMO, SCHERTZER ([id number]) Visit Report for 05/04/2021 Debridement Details Patient Name: Date of Service: [name], [name] 05/04/2021 9:15 A M Medical Record Number: [id number] Patient Account Number: [id number] Date of Birth/Sex: Treating RN: 09/11/1950 (70 y.o. [name] Primary Care Provider: Jilda [name] Other Clinician: Referring Provider: Treating Provider/Extender: [name] in Treatment: 3 Debridement Performed for Assessment: Wound #18 Left,Plantar Metatarsal head first Performed By: Physician [name]., MD Debridement Type: Debridement Severity of Tissue Pre Debridement: Fat layer exposed Level of Consciousness (Pre-procedure): Awake and Alert Pre-procedure Verification/Time Out Yes - 09:50 Taken: Start Time: 09:51 Pain Control: Other : benzocaine 20% spray T Area Debrided (L x W): otal 4 (cm) x 5 (cm) = 20 (cm) Tissue and other material debrided: Viable, Non-Viable, Slough, Subcutaneous, Skin: Dermis , Skin: Epidermis, Fibrin/Exudate, Slough Level: Skin/Subcutaneous Tissue Debridement Description: Excisional Instrument: Blade Bleeding: Moderate Hemostasis Achieved: Silver Nitrate End Time: 10:00 Procedural Pain: 1 Post Procedural Pain: 4 Response to Treatment: Procedure was tolerated well Level of Consciousness (Post- Awake and Alert procedure): Post Debridement Measurements of Total Wound Length: (cm) 4 Width: (cm) 5 Depth: (cm) 0.1 Volume: (cm) 1.571 Character of Wound/Ulcer Post Debridement: Requires Further Debridement Severity of Tissue Post Debridement: Fat layer exposed Post Procedure Diagnosis Same as Pre-procedure Electronic Signature(s) Signed: 05/04/2021 5:10:51 PM By: [name] Ham MD Signed: 05/04/2021 5:16:08 PM By: [name] Pilling RN, BSN Entered By: [name] on 05/04/2021 10:17:59 -------------------------------------------------------------------------------- Debridement Details Patient  Name: Date of Service: [name]. 05/04/2021 9:15 A M Medical Record Number: [id number] Patient Account Number: [id number] Date of Birth/Sex: Treating RN: 1951-06-07 (70 y.o. [name] Primary Care Provider: Jilda [name] Other Clinician: Referring Provider: Treating Provider/Extender: [name] in Treatment: 3 Debridement Performed for Assessment: Wound #19 Left,Anterior Lower Leg Performed By: Physician [name]., MD Debridement Type: Debridement Severity of Tissue Pre Debridement: Fat layer exposed Level of Consciousness (Pre-procedure): Awake and Alert Pre-procedure Verification/Time Out Yes - 09:50 Taken: Start Time: 09:51 Pain Control: Other : benzocaine 20% spray T Area Debrided (L x W): otal 4.5 (cm) x 3.7 (cm) = 16.65 (cm) Tissue and other material debrided: Viable, Non-Viable, Slough, Subcutaneous, Skin: Dermis , Skin: Epidermis, Fibrin/Exudate, Slough Level: Skin/Subcutaneous Tissue Debridement Description: Excisional Instrument: Curette Bleeding: Moderate Hemostasis Achieved: Pressure End Time: 10:00 Procedural Pain: 1 Post Procedural Pain: 4 Response to Treatment: Procedure was tolerated well Level of Consciousness (Post- Awake and Alert procedure): Post Debridement Measurements of Total Wound Length: (cm) 4.5 Width: (cm) 3.7 Depth: (cm) 0.3 Volume: (cm) 3.923 Character of Wound/Ulcer Post Debridement: Requires Further Debridement Severity of Tissue Post Debridement: Fat layer exposed Post Procedure Diagnosis Same as Pre-procedure Electronic Signature(s) Signed: 05/04/2021 5:10:51 PM By: [name] Ham MD Signed: 05/04/2021 5:16:08 PM By: [name] Pilling RN, BSN Entered By: [name] on 05/04/2021 10:18:12 -------------------------------------------------------------------------------- HPI Details Patient Name: Date of Service: [name]. 05/04/2021 9:15 A M Medical Record Number:  [id number] Patient Account Number: [id number] Date of Birth/Sex: Treating RN: 11/27/1950 (70 y.o. [name] Primary Care Provider: Jilda [name] Other Clinician: Referring Provider: Treating Provider/Extender: [name] in Treatment: 3 History of Present Illness HPI Description: 10/11/17; Mr. Freese is a 70 year old man who tells me that in 2015 he slipped down the latter traumatizing his left leg. He developed a wound in the same spot the area that we are currently looking at. He states this closed over for the most part although he  always felt it was somewhat unstable. In 2016 he hit the same area with the door of his car had this reopened. He tells me that this is never really closed although sometimes an inflow it remains open on a constant basis. He has not been using any specific dressing to this except for topical antibiotics the nature of which were not really sure. His primary doctor did send him to see Dr. [name] of interventional cardiology. He underwent an angiogram on 08/06/17 and he underwent a PTA and directional atherectomy of the lesser distal SFA and popliteal arteries which resulted in brisk improvement in blood flow. It was noted that he had 2 vessel runoff through the anterior tibial and peroneal. He is also been to see vascular and interventional radiologist. He was not felt to have any significant superficial venous insufficiency. Presumably is not a candidate for any ablation. It was suggested he come here for wound care. The patient is a type II diabetic on insulin. He also has a history of venous insufficiency. ABIs on the left were noncompressible in our clinic 10/21/17; patient we admitted to the clinic last week. 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 We put Iodosorb on him after an aggressive debridement and 3 layer compression. He complained of pain in his ankle and itching  with is skin in fact he scratched the area on the medial calf superiorly at the rim of our wraps and he has 2 small open areas in that location today which are new. I changed his primary dressing today to silver collagen. As noted he is already had revascularization and does not have any significant superficial venous insufficiency that would be amenable to ablation 10/28/17; patient admitted to the clinic 2 weeks ago. He has a smaller Wound. Scratch injury from last week revealed. There is large wound over the tibial area. This is smaller. Granulation looks healthy. No need for debridement. 11/04/17; the wound on the left lateral calf looks better. Improved dimensions. Surface of this looks better. We've been maintaining him and Kerlix Coban wraps. He finds this much more comfortable. Silver collagen dressing 11/11/17; left lateral Wound continues to look healthy be making progress. Using a #5 curet I removed removed nonviable skin from the surface of the wound and then necrotic debris from the wound surface. Surface of the wound continues to look healthy. He also has an open area on the left great toenail bed. We've been using topical antibiotics. 11/19/17; left anterior lateral wound continues to look healthy but it's not closed. He also had a small wound above this on the left leg Initially traumatic wounds in the setting of significant chronic venous insufficiency and stasis dermatitis 11/25/17; left anterior wounds superiorly is closed still a small wound inferiorly. 12/02/17; left anterior tibial area. Arrives today with adherent callus. Post debridement clearly not completely closed. Hydrofera Blue under 3 layer compression. 12/09/17; left anterior tibia. Circumferential eschar however the wound bed looks stable to improved. We've been using Hydrofera Blue under 3 layer compression 12/17/17; left anterior tibia. Apparently this was felt to be closed however when the wrap was taken off there is a skin  tear to reopen wounds in the same area we've been using Hydrofera Blue under 3 layer compression 12/23/17 left anterior tibia. Not close to close this week apparently the [name] was stuck to this again. Still circumferential eschar requiring debridement. I put a contact layer on this  this time under the [facility] 12/31/17; left anterior tibia. Wound is better slight amount of hyper-granulation. Using Hydrofera Blue over Adaptic. 01/07/18; left anterior tibia. The wound had some surface eschar however after this was removed he has no open wound.he was already revascularized by Dr. [name] when he came to our clinic with atherectomy of the left SFA and popliteal artery. He was also sent to interventional radiology for venous reflux studies. He was not felt to have significant reflux but certainly has chronic venous changes of his skin with hemosiderin deposition around this area. He will definitely need to lubricate his skin and wear compression stocking and I've talked to him about this. READMISSION 05/26/2018 This is a now 70 year old man we cared for with traumatic wounds on his left anterior lower extremity. He had been previously revascularized during that admission by Dr. [name]. Apparently in follow-up Dr. [name] noted that he had deterioration in his arterial status. He underwent a stent placement in the distal left SFA on 04/22/2018. 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 He went back to the angiography suite on 04/30/2018 he underwent PTA and balloon angioplasty of the occluded left mid anterior tibial artery, thrombotic occlusion went from 100 to 0% which reconstitutes the posterior tibial artery. He had thrombectomy and aspiration of the peroneal artery. The stent placed in the distal SFA left SFA was still occluded. He was discharged on Xarelto, it was noted on the discharge summary from this hospitalization that he had gangrene at the tip of his left fifth toe and  there were expectations this would auto amputate. 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 He has been recuperating at [place] in [facility] after the most recent hospitalization. He is going home tomorrow. He tells me that 2 weeks ago he traumatized the tip of his left fifth toe. He came in urgently for our review of this. This was a history of before I noted that Dr. [name] had already noted dry gangrenous changes of the left fifth toe 06/09/2018; 2-week follow-up. I did contact Dr. [name] after his last appointment and he apparently saw 1 of Dr. Irven Shelling colleagues the next day. He does not follow-up with Dr. [name] himself until Thursday of this week. 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 Nevertheless there is no evidence of infection no drainage and no pain. He had a new area that this week when we were signing him in today on the left anterior mid tibia area, this is in close proximity to the previous wound we have dealt with in this clinic. 06/23/2018; 2-week follow-up. I did not receive a recent note from Dr. [name] to review today. Our office is trying to obtain this. He is apparently not planning to do further vascular interventions and wondered about compression to try and help with the patient's chronic venous insufficiency. However we are also concerned about the arterial flow. He arrives in clinic today with a new area on the left third toe. The areas on the calf/anterior tibia are close to closing. The left fifth toe is still mummified using Betadine. -In reviewing things with the patient he has what sounds like claudication with mild to moderate amount of activity. 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 I prescribed Levaquin over the phone while we attempted to arrange a plan of care. However the patient called yesterday to  report he had low-grade fever and he came  in today acutely. There is been a marked deterioration in the left third toe with spreading cellulitis up into the dorsal left foot. He was referred to the emergency room. Readmission: 06/29/2020 patient presents today for reevaluation here in our clinic he was previously treated by Dr. Dellia Nims at the latter part of 2019 in 2 the beginning of 2020. Subsequently we have not seen him since that time in the interim he did have evaluation with vein and vascular specialist specifically Dr. Anice Paganini who did perform quite extensive work for a left femoral to anterior tibial artery bypass. With that being said in the interim the patient has developed significant lymphedema and has wounds that he tells me have really never healed in regard to the incision site on the left leg. He also has multiple wounds on the feet for various reasons some of which is that he tends to pick at his feet. Fortunately there is no signs of active infection systemically at this time he does have some wounds that are little bit deeper but most are fairly superficial he seems to have good blood flow and overall everything appears to be healthy I see no bone exposed and no obvious signs of osteomyelitis. I do not know that he necessarily needs a x-ray at this point although that something we could consider depending on how things progress. The patient does have a history of lymphedema, diabetes, this is type II, chronic kidney disease stage III, hypertension, and history of peripheral vascular disease. 07/05/2020; patient admitted last week. Is a patient I remember from 2019 he had a spreading infection involving the left foot and we sent him to the hospital. 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 He subsequently had a left femoral to anterior tibial bypass by Dr.Cain vein and vascular. He also has severe lymphedema with chronic skin changes related to that on the left leg. The most  problematic area that was new today was on the left medial great toe. This was apparently a small area last week there was purulent drainage which our intake nurse cultured. Also areas on the left medial foot and heel left lateral foot. He has 2 areas on the left medial calf left lateral calf in the setting of the severe lymphedema. 07/13/2020 on evaluation today patient appears to be doing better in my opinion compared to his last visit. The good news is there is no signs of active infection systemically and locally I do not see any signs of infection either. He did have an x-ray which was negative that is great news he had a culture which showed MRSA but at the same time he is been on the doxycycline which has helped. I do think we may want to extend this for 7 additional days 1/25; patient admitted to the clinic a few weeks ago. 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 We have been putting him under compression his edema control is a lot better but he is severe verricused skin on the left leg. He is really done quite well he still has an open area on the left medial calf and the left medial first metatarsal head. We have been using silver collagen on the leg silver alginate on the foot 07/27/2020 upon evaluation today patient appears to be doing decently well in regard to his wounds. He still has a lot  of dry skin on the left leg. Some of this is starting to peel back and I think he may be able to have them out by removing some that today. Fortunately there is no signs of active infection at this time on the left leg although on the right leg he does appear to have swelling and erythema as well as some mild warmth to touch. This does have been concerned about the possibility of cellulitis although within the differential diagnosis I do think that potentially a DVT has to be at least considered. We need to rule that out before proceeding would just call in  the cellulitis. Especially since he is having pain in the posterior aspect of his calf muscle. 2/8; the patient had seen sparingly. He has severe skin changes of chronic lymphedema in the left leg thickened hyperkeratotic verrucous skin. He has an open wound on the medial part of the left first met head left mid tibia. He also has a rim of nonepithelialized skin in the anterior mid tibia. He brought in the AmLactin lotion that was been prescribed although I am not sure under compression and its utility. There concern about cellulitis on the right lower leg the last time he was here. He was put on on antibiotics. His DVT rule out was negative. The right leg looks fine he is using his stocking on this area 08/10/2020 upon evaluation today patient appears to be doing well with regard to his leg currently. He has been tolerating the dressing changes without complication. Fortunately there is no signs of active infection which is great news. Overall very pleased with where things stand. 2/22; the patient still has an area on the medial part of the left first met his head. This looks better than when I last saw this earlier this month he has a rim of epithelialization but still some surface debris. Mostly everything on the left leg is healed. There is still a vulnerable in the left mid tibia area. 08/30/2020 upon evaluation today patient appears to be doing much better in regard to his wounds on his foot. 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 Nonetheless I think we will address that today I Minna send in a prescription for him in that regard. Overall though there does not appear to be any signs of significant worsening. 09/07/2020 on evaluation today patient's wounds over his left foot appear to be doing excellent. I do not see any signs of infection there is some callus  buildup this can require debridement for certain but overall I feel like he is managing quite nicely. He still using the AmLactin cream which has been beneficial for him as well. 3/22; left foot wound is closed. There is no open area here. He is using ammonium lactate lotion to the lower extremities to help exfoliate dry cracked skin. He has compression stockings from elastic therapy in [place]. The wound on the medial part of his left first met head is healed today. READMISSION 04/12/2021 Mr. Neighbors is a patient we know fairly well he had a prolonged stay in clinic in 2019 with wounds on his left lateral and left anterior lower extremity in the setting of chronic venous insufficiency. More recently he was here earlier this year with predominantly an area on his left foot first metatarsal head plantar and he says the plantar foot broke down  on its not long after we discharged him but he did not come back here. The last few months areas of broken down on his left anterior and again the left lateral lower extremity. The leg itself is very swollen chronically enlarged a lot of hyperkeratotic dry Berry Q skin in the left lower leg. His edema extends well into the thigh. He was seen by Dr. Donzetta Matters. He had ABIs on 03/02/2021 showing an ABI on the right of 1 with a TBI of 0.72 his ABI in the left at 1.09 TBI of 0.99. Monophasic and biphasic waveforms on the right. On the left monophasic waveforms were noted he went on to have an angiogram on 03/27/2021 this showed the aortic aortic and iliac segments were free of flow-limiting stenosis the left common femoral vein to evaluate the left femoral to anterior tibial artery bypass was unobstructed the bypass was patent without any areas of stenosis. We discharged the patient in bilateral juxta lite stockings but very clearly that was not sufficient to control the swelling and maintain skin integrity. He is clearly going to need compression pumps. 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 This is fortunate because he is on his feet for long periods of time. 10/27; patient comes in with our intake nurse reporting copious amount of green drainage from the left anterior mid tibia the left dorsal foot and to a lesser extent the left medial mid tibia. We left the compression wrap on all week for the amount of edema in his left leg is quite a bit better. We use silver alginate as the primary dressing 11/3; edema control is good. Left anterior lower leg left medial lower leg and the plantar first metatarsal head. The left anterior lower leg required debridement. Deep tissue culture I did of this wound showed MRSA I put him on 10 days of doxycycline which she will start today. We have him in compression wraps. He has a security [name] and [employer] however he is retiring on November 15. We will need to then get him into a better offloading boot for the left foot perhaps a total contact cast 11/10; edema control is quite good. Left anterior and left medial lower leg wounds in the setting of chronic venous insufficiency and lymphedema. He also has a substantial area over the left plantar first metatarsal head. 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 He has significant hypergranulation on the left plantar foot wound. The patient is a diabetic but he does not have significant PAD Electronic Signature(s) Signed: 05/04/2021 5:10:51 PM By: [name] Ham MD Entered By: [name] on 05/04/2021 10:20:01 -------------------------------------------------------------------------------- Physical Exam Details Patient Name: Date of Service: [name]. 05/04/2021 9:15 A M Medical Record Number: [id number] Patient Account Number: [id number] Date of Birth/Sex: Treating RN: 12-17-1950 (69 y.o. [name] Primary Care  Provider: Jilda [name] Other Clinician: Referring Provider: Treating Provider/Extender: [name] in Treatment: 3 Constitutional Patient is hypertensive.. Pulse regular and within target range for patient.Marland Kitchen Respirations regular, non-labored and within target range.. Temperature is normal and within the target range for the patient.Marland Kitchen Appears in no distress. Notes Wound exam; left anterior lower leg wound required an aggressive debridement with a #5 curette under percent covered by a very fibrinous gritty debris. I used a #10 scalpel to  remove a large amount of hypergranulation from the plantar left foot wound. This was a substantial subcutaneous debridement hemostasis with silver nitrate, Gelfoam and a compression dressing Electronic Signature(s) Signed: 05/04/2021 5:10:51 PM By: [name] Ham MD Entered By: [name] on 05/04/2021 10:21:39 -------------------------------------------------------------------------------- Physician Orders Details Patient Name: Date of Service: [name]. 05/04/2021 9:15 A M Medical Record Number: [id number] Patient Account Number: [id number] Date of Birth/Sex: Treating RN: 10-22-1950 (70 y.o. [name], [name] Primary Care Provider: Jilda [name] Other Clinician: Referring Provider: Treating Provider/Extender: [name] in Treatment: 3 Verbal / Phone Orders: No Diagnosis Coding ICD-10 Coding Code Description E11.621 Type 2 diabetes mellitus with foot ulcer 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 I89.0 Lymphedema, not elsewhere classified I87.322 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verity 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 of other part of left foot with other specified severity E11.42 Type 2 diabetes mellitus  with diabetic polyneuropathy Follow-up Appointments ppointment in 1 week. - Dr. Dellia Nims Thursday Return A Nurse Visit: - Monday 05/08/21 Bathing/ Shower/ Hygiene May shower with protection but do not get wound dressing(s) wet. - Use a cast protector so you can shower without getting your wrap(s) wet Edema Control - Lymphedema / SCD / Other Elevate legs to the level of the heart or above for 30 minutes daily and/or when sitting, a frequency of: Avoid standing for long periods of time. Patient to wear own compression stockings every day. - on right leg; Moisturize legs daily. - Ammonium LACTATE to BLE every day. Wound Treatment Wound #18 - Metatarsal head first Wound Laterality: Plantar, Left Cleanser: Normal Saline 2 x Per Week/7 Days Discharge Instructions: Cleanse the wound with Normal Saline prior to applying a clean dressing using gauze sponges, not tissue or cotton balls. Cleanser: Soap and Water 2 x Per Week/7 Days Discharge Instructions: May shower and wash wound with dial antibacterial soap and water prior to dressing change. Cleanser: Wound Cleanser 2 x Per Week/7 Days Discharge Instructions: Cleanse the wound with wound cleanser prior to applying a clean dressing using gauze sponges, not tissue or cotton balls. Peri-Wound Care: Triamcinolone 15 (g) 2 x Per Week/7 Days Discharge Instructions: Use triamcinolone 15 (g) as directed Peri-Wound Care: Zinc Oxide Ointment 30g tube 2 x Per Week/7 Days Discharge Instructions: Apply Zinc Oxide to periwound with each dressing change Peri-Wound Care: Sween Lotion (Moisturizing lotion) 2 x Per Week/7 Days Discharge Instructions: Apply moisturizing lotion as directed Prim Dressing: Hydrofera Blue Classic Foam, 4x4 in 2 x Per Week/7 Days ary Discharge Instructions: Moisten with saline prior to applying to wound bed Secondary Dressing: ABD Pad, 5x9 2 x Per Week/7 Days Discharge Instructions: Apply over primary dressing as directed. Secondary  Dressing: Zetuvit Plus 4x8 in 2 x Per Week/7 Days Discharge Instructions: Apply over primary dressing as directed. Secondary Dressing: CarboFLEX Odor Control Dressing, 4x4 in 2 x Per Week/7 Days Discharge Instructions: Apply over primary dressing as directed. Compression Wrap: Unnaboot w/Calamine, 4x10 (in/yd) 2 x Per Week/7 Days Discharge Instructions: Apply Unnaboot as directed. May also use Miliken CoFlex Calamine 2 layer compression system as alternative. Wound #19 - Lower Leg Wound Laterality: Left, Anterior Cleanser: Normal Saline 2 x Per Week/7 Days Discharge Instructions: Cleanse the wound with Normal Saline prior to applying a clean dressing using gauze sponges, not tissue or cotton balls. Cleanser: Soap and Water 2 x  Per Week/7 Days Discharge Instructions: May shower and wash wound with dial antibacterial soap and water prior to dressing change. Cleanser: Wound Cleanser 2 x Per Week/7 Days Discharge Instructions: Cleanse the wound with wound cleanser prior to applying a clean dressing using gauze sponges, not tissue or cotton balls. Peri-Wound Care: Triamcinolone 15 (g) 2 x Per Week/7 Days Discharge Instructions: Use triamcinolone 15 (g) as directed Peri-Wound Care: Zinc Oxide Ointment 30g tube 2 x Per Week/7 Days Discharge Instructions: Apply Zinc Oxide to periwound with each dressing change Peri-Wound Care: Sween Lotion (Moisturizing lotion) 2 x Per Week/7 Days Discharge Instructions: Apply moisturizing lotion as directed Prim Dressing: Hydrofera Blue Classic Foam, 4x4 in 2 x Per Week/7 Days ary Discharge Instructions: Moisten with saline prior to applying to wound bed Secondary Dressing: ABD Pad, 5x9 2 x Per Week/7 Days Discharge Instructions: Apply over primary dressing as directed. Secondary Dressing: Zetuvit Plus 4x8 in 2 x Per Week/7 Days Discharge Instructions: Apply over primary dressing as directed. Secondary Dressing: CarboFLEX Odor Control Dressing, 4x4 in 2 x Per  Week/7 Days Discharge Instructions: Apply over primary dressing as directed. Compression Wrap: Unnaboot w/Calamine, 4x10 (in/yd) 2 x Per Week/7 Days Discharge Instructions: Apply Unnaboot as directed. May also use Miliken CoFlex Calamine 2 layer compression system as alternative. Wound #21 - Lower Leg Wound Laterality: Left, Medial Cleanser: Normal Saline 2 x Per Week/7 Days Discharge Instructions: Cleanse the wound with Normal Saline prior to applying a clean dressing using gauze sponges, not tissue or cotton balls. Cleanser: Soap and Water 2 x Per Week/7 Days Discharge Instructions: May shower and wash wound with dial antibacterial soap and water prior to dressing change. Cleanser: Wound Cleanser 2 x Per Week/7 Days Discharge Instructions: Cleanse the wound with wound cleanser prior to applying a clean dressing using gauze sponges, not tissue or cotton balls. Peri-Wound Care: Triamcinolone 15 (g) 2 x Per Week/7 Days Discharge Instructions: Use triamcinolone 15 (g) as directed Peri-Wound Care: Zinc Oxide Ointment 30g tube 2 x Per Week/7 Days Discharge Instructions: Apply Zinc Oxide to periwound with each dressing change Peri-Wound Care: Sween Lotion (Moisturizing lotion) 2 x Per Week/7 Days Discharge Instructions: Apply moisturizing lotion as directed Prim Dressing: Hydrofera Blue Classic Foam, 4x4 in 2 x Per Week/7 Days ary Discharge Instructions: Moisten with saline prior to applying to wound bed Secondary Dressing: ABD Pad, 5x9 2 x Per Week/7 Days Discharge Instructions: Apply over primary dressing as directed. Secondary Dressing: Zetuvit Plus 4x8 in 2 x Per Week/7 Days Discharge Instructions: Apply over primary dressing as directed. Secondary Dressing: CarboFLEX Odor Control Dressing, 4x4 in 2 x Per Week/7 Days Discharge Instructions: Apply over primary dressing as directed. Compression Wrap: Unnaboot w/Calamine, 4x10 (in/yd) 2 x Per Week/7 Days Discharge Instructions: Apply Unnaboot  as directed. May also use Miliken CoFlex Calamine 2 layer compression system as alternative. Electronic Signature(s) Signed: 05/04/2021 5:10:51 PM By: [name] Ham MD Signed: 05/04/2021 5:16:08 PM By: [name] Pilling RN, BSN Entered By: [name] on 05/04/2021 10:08:55 -------------------------------------------------------------------------------- Problem List Details Patient Name: Date of Service: [name]. 05/04/2021 9:15 A M Medical Record Number: [id number] Patient Account Number: [id number] Date of Birth/Sex: Treating RN: 09-15-1950 (70 y.o. [name] Primary Care Provider: Jilda [name] Other Clinician: Referring Provider: Treating Provider/Extender: [name] in Treatment: 3 Active Problems ICD-10 Encounter Code Description Active Date MDM Diagnosis E11.621 Type 2 diabetes mellitus with foot ulcer 04/12/2021 No Yes E11.51 Type 2 diabetes mellitus with diabetic peripheral angiopathy  without gangrene 04/12/2021 No Yes I89.0 Lymphedema, not elsewhere classified 04/12/2021 No Yes I87.322 Chronic venous hypertension (idiopathic) with inflammation of left lower 04/12/2021 No Yes extremity 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 ulcer of other part of left lower leg with other specified 04/12/2021 No Yes severity 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 of other part of left foot with other specified 04/12/2021 No Yes severity E11.42 Type 2 diabetes mellitus with diabetic polyneuropathy 04/12/2021 No Yes Inactive Problems Resolved Problems Electronic Signature(s) Signed: 05/04/2021 5:10:51 PM By: [name] Ham MD Entered By: [name] on 05/04/2021 10:17:32 -------------------------------------------------------------------------------- Progress Note Details Patient Name: Date of Service: [name]. 05/04/2021 9:15 A M Medical Record Number: [id number] Patient Account Number: [id number] Date of Birth/Sex: Treating RN: 15-Dec-1950 (70  y.o. [name] Primary Care Provider: Jilda [name] Other Clinician: Referring Provider: Treating Provider/Extender: [name] in Treatment: 3 Subjective History of Present Illness (HPI) 10/11/17; Mr. Cada is a 70 year old man who tells me that in 2015 he slipped down the latter traumatizing his left leg. He developed a wound in the same spot the area that we are currently looking at. He states this closed over for the most part although he always felt it was somewhat unstable. In 2016 he hit the same area with the door of his car had this reopened. He tells me that this is never really closed although sometimes an inflow it remains open on a constant basis. He has not been using any specific dressing to this except for topical antibiotics the nature of which were not really sure. His primary doctor did send him to see Dr. [name] of interventional cardiology. He underwent an angiogram on 08/06/17 and he underwent a PTA and directional atherectomy of the lesser distal SFA and popliteal arteries which resulted in brisk improvement in blood flow. It was noted that he had 2 vessel runoff through the anterior tibial and peroneal. He is also been to see vascular and interventional radiologist. He was not felt to have any significant superficial venous insufficiency. Presumably is not a candidate for any ablation. It was suggested he come here for wound care. The patient is a type II diabetic on insulin. He also has a history of venous insufficiency. ABIs on the left were noncompressible in our clinic 10/21/17; patient we admitted to the clinic last week. 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 We put Iodosorb on him after an aggressive debridement and 3 layer compression. He complained of pain in his ankle and itching with is skin in fact he scratched the area on the medial calf superiorly at the rim of our  wraps and he has 2 small open areas in that location today which are new. I changed his primary dressing today to silver collagen. As noted he is already had revascularization and does not have any significant superficial venous insufficiency that would be amenable to ablation 10/28/17; patient admitted to the clinic 2 weeks ago. He has a smaller Wound. Scratch injury from last week revealed. There is large wound over the tibial area. This is smaller. Granulation looks healthy. No need for debridement. 11/04/17; the wound on the left lateral calf looks better. Improved dimensions. Surface of this looks better. We've been maintaining him and Kerlix Coban wraps. He finds this much more comfortable. Silver collagen dressing 11/11/17; left lateral Wound continues to look healthy be making progress. Using a #5 curet I removed removed nonviable skin from  the surface of the wound and then necrotic debris from the wound surface. Surface of the wound continues to look healthy. ooHe also has an open area on the left great toenail bed. We've been using topical antibiotics. 11/19/17; left anterior lateral wound continues to look healthy but it's not closed. ooHe also had a small wound above this on the left leg ooInitially traumatic wounds in the setting of significant chronic venous insufficiency and stasis dermatitis 11/25/17; left anterior wounds superiorly is closed still a small wound inferiorly. 12/02/17; left anterior tibial area. Arrives today with adherent callus. Post debridement clearly not completely closed. Hydrofera Blue under 3 layer compression. 12/09/17; left anterior tibia. Circumferential eschar however the wound bed looks stable to improved. We've been using Hydrofera Blue under 3 layer compression 12/17/17; left anterior tibia. Apparently this was felt to be closed however when the wrap was taken off there is a skin tear to reopen wounds in the same area we've been using Hydrofera Blue under 3  layer compression 12/23/17 left anterior tibia. Not close to close this week apparently the [facility] was stuck to this again. Still circumferential eschar requiring debridement. I put a contact layer on this this time under the Hydrofera Blue 12/31/17; left anterior tibia. Wound is better slight amount of hyper-granulation. Using Hydrofera Blue over Adaptic. 01/07/18; left anterior tibia. The wound had some surface eschar however after this was removed he has no open wound.he was already revascularized by Dr. [name] when he came to our clinic with atherectomy of the left SFA and popliteal artery. He was also sent to interventional radiology for venous reflux studies. He was not felt to have significant reflux but certainly has chronic venous changes of his skin with hemosiderin deposition around this area. He will definitely need to lubricate his skin and wear compression stocking and I've talked to him about this. READMISSION 05/26/2018 This is a now 70 year old man we cared for with traumatic wounds on his left anterior lower extremity. He had been previously revascularized during that admission by Dr. [name]. Apparently in follow-up Dr. [name] noted that he had deterioration in his arterial status. He underwent a stent placement in the distal left SFA on 04/22/2018. 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 He went back to the angiography suite on 04/30/2018 he underwent PTA and balloon angioplasty of the occluded left mid anterior tibial artery, thrombotic occlusion went from 100 to 0% which reconstitutes the posterior tibial artery. He had thrombectomy and aspiration of the peroneal artery. The stent placed in the distal SFA left SFA was still occluded. 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 Noninvasive studies on 05/02/2018  showed an TBI on the left at 0.43 and 0.82 on the right. He has been recuperating at [place] [facility] in [facility] after the most recent hospitalization. He is going home tomorrow. He tells me that 2 weeks ago he traumatized the tip of his left fifth toe. He came in urgently for our review of this. This was a history of before I noted that Dr. [name] had already noted dry gangrenous changes of the left fifth toe 06/09/2018; 2-week follow-up. I did contact Dr. [name] after his last appointment and he apparently saw 1 of Dr. Irven Shelling colleagues the next day. He does not follow-up with Dr. [name] himself until Thursday of this week.  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 Nevertheless there is no evidence of infection no drainage and no pain. He had a new area that this week when we were signing him in today on the left anterior mid tibia area, this is in close proximity to the previous wound we have dealt with in this clinic. 06/23/2018; 2-week follow-up. I did not receive a recent note from Dr. [name] to review today. Our office is trying to obtain this. He is apparently not planning to do further vascular interventions and wondered about compression to try and help with the patient's chronic venous insufficiency. However we are also concerned about the arterial flow. ooHe arrives in clinic today with a new area on the left third toe. The areas on the calf/anterior tibia are close to closing. The left fifth toe is still mummified using Betadine. -In reviewing things with the patient he has what sounds like claudication with mild to moderate amount of activity. 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 I prescribed Levaquin over the phone while we attempted to arrange a plan of care. However the patient called yesterday to report he had low-grade fever and he came in today acutely. There is been a marked deterioration in the left third toe with  spreading cellulitis up into the dorsal left foot. He was referred to the emergency room. Readmission: 06/29/2020 patient presents today for reevaluation here in our clinic he was previously treated by Dr. Dellia Nims at the latter part of 2019 in 2 the beginning of 2020. Subsequently we have not seen him since that time in the interim he did have evaluation with vein and vascular specialist specifically Dr. Anice Paganini who did perform quite extensive work for a left femoral to anterior tibial artery bypass. With that being said in the interim the patient has developed significant lymphedema and has wounds that he tells me have really never healed in regard to the incision site on the left leg. He also has multiple wounds on the feet for various reasons some of which is that he tends to pick at his feet. Fortunately there is no signs of active infection systemically at this time he does have some wounds that are little bit deeper but most are fairly superficial he seems to have good blood flow and overall everything appears to be healthy I see no bone exposed and no obvious signs of osteomyelitis. I do not know that he necessarily needs a x-ray at this point although that something we could consider depending on how things progress. The patient does have a history of lymphedema, diabetes, this is type II, chronic kidney disease stage III, hypertension, and history of peripheral vascular disease. 07/05/2020; patient admitted last week. Is a patient I remember from 2019 he had a spreading infection involving the left foot and we sent him to the hospital. 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 He subsequently had a left femoral to anterior tibial bypass by Dr.Cain vein and vascular. He also has severe lymphedema with chronic skin changes related to that on the left leg. The most problematic area that was new today was on the left medial great toe. This was apparently a  small area last week there was purulent drainage which our intake nurse cultured. Also areas on the left medial foot and heel left lateral foot. He has 2 areas on the left medial  calf left lateral calf in the setting of the severe lymphedema. 07/13/2020 on evaluation today patient appears to be doing better in my opinion compared to his last visit. The good news is there is no signs of active infection systemically and locally I do not see any signs of infection either. He did have an x-ray which was negative that is great news he had a culture which showed MRSA but at the same time he is been on the doxycycline which has helped. I do think we may want to extend this for 7 additional days 1/25; patient admitted to the clinic a few weeks ago. 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 We have been putting him under compression his edema control is a lot better but he is severe verricused skin on the left leg. He is really done quite well he still has an open area on the left medial calf and the left medial first metatarsal head. We have been using silver collagen on the leg silver alginate on the foot 07/27/2020 upon evaluation today patient appears to be doing decently well in regard to his wounds. He still has a lot of dry skin on the left leg. Some of this is starting to peel back and I think he may be able to have them out by removing some that today. Fortunately there is no signs of active infection at this time on the left leg although on the right leg he does appear to have swelling and erythema as well as some mild warmth to touch. This does have been concerned about the possibility of cellulitis although within the differential diagnosis I do think that potentially a DVT has to be at least considered. We need to rule that out before proceeding would just call in the cellulitis. Especially since he is having pain in the posterior aspect of his calf  muscle. 2/8; the patient had seen sparingly. He has severe skin changes of chronic lymphedema in the left leg thickened hyperkeratotic verrucous skin. He has an open wound on the medial part of the left first met head left mid tibia. He also has a rim of nonepithelialized skin in the anterior mid tibia. He brought in the AmLactin lotion that was been prescribed although I am not sure under compression and its utility. There concern about cellulitis on the right lower leg the last time he was here. He was put on on antibiotics. His DVT rule out was negative. The right leg looks fine he is using his stocking on this area 08/10/2020 upon evaluation today patient appears to be doing well with regard to his leg currently. He has been tolerating the dressing changes without complication. Fortunately there is no signs of active infection which is great news. Overall very pleased with where things stand. 2/22; the patient still has an area on the medial part of the left first met his head. This looks better than when I last saw this earlier this month he has a rim of epithelialization but still some surface debris. Mostly everything on the left leg is healed. There is still a vulnerable in the left mid tibia area. 08/30/2020 upon evaluation today patient appears to be doing much better in regard to his wounds on his foot. 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 Nonetheless I think we  will address that today I Minna send in a prescription for him in that regard. Overall though there does not appear to be any signs of significant worsening. 09/07/2020 on evaluation today patient's wounds over his left foot appear to be doing excellent. I do not see any signs of infection there is some callus buildup this can require debridement for certain but overall I feel like he is managing  quite nicely. He still using the AmLactin cream which has been beneficial for him as well. 3/22; left foot wound is closed. There is no open area here. He is using ammonium lactate lotion to the lower extremities to help exfoliate dry cracked skin. He has compression stockings from [facility] in [place]. The wound on the medial part of his left first met head is healed today. READMISSION 04/12/2021 Mr. Aranas is a patient we know fairly well he had a prolonged stay in clinic in 2019 with wounds on his left lateral and left anterior lower extremity in the setting of chronic venous insufficiency. More recently he was here earlier this year with predominantly an area on his left foot first metatarsal head plantar and he says the plantar foot broke down on its not long after we discharged him but he did not come back here. The last few months areas of broken down on his left anterior and again the left lateral lower extremity. The leg itself is very swollen chronically enlarged a lot of hyperkeratotic dry Berry Q skin in the left lower leg. His edema extends well into the thigh. He was seen by Dr. Donzetta Matters. He had ABIs on 03/02/2021 showing an ABI on the right of 1 with a TBI of 0.72 his ABI in the left at 1.09 TBI of 0.99. Monophasic and biphasic waveforms on the right. On the left monophasic waveforms were noted he went on to have an angiogram on 03/27/2021 this showed the aortic aortic and iliac segments were free of flow-limiting stenosis the left common femoral vein to evaluate the left femoral to anterior tibial artery bypass was unobstructed the bypass was patent without any areas of stenosis. We discharged the patient in bilateral juxta lite stockings but very clearly that was not sufficient to control the swelling and maintain skin integrity. He is clearly going to need compression pumps. 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 This is  fortunate because he is on his feet for long periods of time. 10/27; patient comes in with our intake nurse reporting copious amount of green drainage from the left anterior mid tibia the left dorsal foot and to a lesser extent the left medial mid tibia. We left the compression wrap on all week for the amount of edema in his left leg is quite a bit better. We use silver alginate as the primary dressing 11/3; edema control is good. Left anterior lower leg left medial lower leg and the plantar first metatarsal head. The left anterior lower leg required debridement. Deep tissue culture I did of this wound showed MRSA I put him on 10 days of doxycycline which she will start today. We have him in compression wraps. He has a security [name] and [employer] however he is retiring on November 15. We will need to then get him into a better offloading boot for the left foot perhaps a total contact cast 11/10; edema control is quite good. Left anterior and left medial lower leg wounds  in the setting of chronic venous insufficiency and lymphedema. He also has a substantial area over the left plantar first metatarsal head. 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 He has significant hypergranulation on the left plantar foot wound. The patient is a diabetic but he does not have significant PAD Objective Constitutional Patient is hypertensive.. Pulse regular and within target range for patient.Marland Kitchen Respirations regular, non-labored and within target range.. Temperature is normal and within the target range for the patient.Marland Kitchen Appears in no distress. Vitals Time Taken: 9:35 AM, Height: 74 in, Weight: 238 lbs, BMI: 30.6, Temperature: 98.5 F, Pulse: 80 bpm, Respiratory Rate: 20 breaths/min, Blood Pressure: 161/78 mmHg. General Notes: Wound exam; left anterior lower leg wound required an aggressive debridement with a #5 curette under  percent covered by a very fibrinous gritty debris. oo I used a #10 scalpel to remove a large amount of hypergranulation from the plantar left foot wound. This was a substantial subcutaneous debridement hemostasis with silver nitrate, Gelfoam and a compression dressing Integumentary (Hair, Skin) Wound #18 status is Open. Original cause of wound was Gradually Appeared. The date acquired was: 08/23/2020. The wound has been in treatment 3 weeks. The wound is located on the Left,Plantar Metatarsal head first. The wound measures 4cm length x 5cm width x 0.1cm depth; 15.708cm^2 area and 1.571cm^3 volume. There is Fat Layer (Subcutaneous Tissue) exposed. There is no tunneling or undermining noted. There is a large amount of serosanguineous drainage noted. The wound margin is distinct with the outline attached to the wound base. There is large (67-100%) red, pink, hyper - granulation within the wound bed. There is no necrotic tissue within the wound bed. Wound #19 status is Open. Original cause of wound was Gradually Appeared. The date acquired was: 08/23/2020. The wound has been in treatment 3 weeks. The wound is located on the Left,Anterior Lower Leg. The wound measures 4.5cm length x 3.7cm width x 0.3cm depth; 13.077cm^2 area and 3.923cm^3 volume. There is Fat Layer (Subcutaneous Tissue) exposed. There is no tunneling or undermining noted. There is a large amount of purulent drainage noted. Foul odor after cleansing was noted. The wound margin is distinct with the outline attached to the wound base. There is no granulation within the wound bed. There is a large (67-100%) amount of necrotic tissue within the wound bed including Adherent Slough. Wound #21 status is Open. Original cause of wound was Gradually Appeared. The date acquired was: 08/23/2020. The wound has been in treatment 3 weeks. The wound is located on the Left,Medial Lower Leg. The wound measures 1.9cm length x 1cm width x 0.4cm depth; 1.492cm^2  area and 0.597cm^3 volume. There is Fat Layer (Subcutaneous Tissue) exposed. There is no tunneling or undermining noted. There is a medium amount of purulent drainage noted. The wound margin is distinct with the outline attached to the wound base. There is small (1-33%) red granulation within the wound bed. There is a large (67-100%) amount of necrotic tissue within the wound bed including Adherent Slough. 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 There was a Excisional Skin/Subcutaneous Tissue Debridement with a total area of 20 sq cm performed by [name]., MD. With the following instrument(s): Blade to remove Viable and Non-Viable tissue/material. Material removed includes Subcutaneous Tissue, Slough, Skin: Dermis, Skin: Epidermis, and Fibrin/Exudate after achieving pain control using Other (benzocaine 20% spray). A time out was conducted at 09:50, prior to the start of the procedure. A Moderate amount of bleeding was controlled with Silver Nitrate. The procedure was tolerated well with a pain level of 1 throughout and a pain level of 4 following the procedure. Post Debridement Measurements: 4cm length x 5cm width x 0.1cm depth; 1.571cm^3 volume. Character of Wound/Ulcer Post Debridement requires further debridement.  Severity of Tissue Post Debridement is: Fat layer exposed. Post procedure Diagnosis Wound #18: Same as Pre-Procedure Pre-procedure diagnosis of Wound #18 is a Diabetic Wound/Ulcer of the Lower Extremity located on the Left,Plantar Metatarsal head first . There was a Haematologist Compression Therapy Procedure by [name] Pilling, RN. Post procedure Diagnosis Wound #18: Same as Pre-Procedure Wound #19 Pre-procedure diagnosis of Wound #19 is a Diabetic Wound/Ulcer of the Lower Extremity located on the Left,Anterior Lower Leg .Severity of Tissue Pre Debridement is: Fat layer exposed. There was a Excisional Skin/Subcutaneous Tissue Debridement with a total area of 16.65 sq cm performed by [name]., MD. With the following instrument(s): Curette to remove Viable and Non-Viable tissue/material. Material removed includes Subcutaneous Tissue, Slough, Skin: Dermis, Skin: Epidermis, and Fibrin/Exudate after achieving pain control using Other (benzocaine 20% spray). A time out was conducted at 09:50, prior to the start of the procedure. A Moderate amount of bleeding was controlled with Pressure. The procedure was tolerated well with a pain level of 1 throughout and a pain level of 4 following the procedure. Post Debridement Measurements: 4.5cm length x 3.7cm width x 0.3cm depth; 3.923cm^3 volume. Character of Wound/Ulcer Post Debridement requires further debridement. Severity of Tissue Post Debridement is: Fat layer exposed. Post procedure Diagnosis Wound #19: Same as Pre-Procedure Pre-procedure diagnosis of Wound #19 is a Diabetic Wound/Ulcer of the Lower Extremity located on the Left,Anterior Lower Leg . There was a Haematologist Compression Therapy Procedure by [name] Pilling, RN. Post procedure Diagnosis Wound #19: Same as Pre-Procedure Wound #21 Pre-procedure diagnosis of Wound #21 is a Diabetic Wound/Ulcer of the Lower Extremity located on the Left,Medial Lower Leg . There was a Haematologist Compression  Therapy Procedure by [name] Pilling, RN. Post procedure Diagnosis Wound #21: Same as Pre-Procedure Plan Follow-up Appointments: Return Appointment in 1 week. - Dr. Dellia Nims Thursday Nurse Visit: - Monday 05/08/21 Bathing/ Shower/ Hygiene: May shower with protection but do not get wound dressing(s) wet. - Use a cast protector so you can shower without getting your wrap(s) wet Edema Control - Lymphedema / SCD / Other: Elevate legs to the level of the heart or above for 30 minutes daily and/or when sitting, a frequency of: Avoid standing for long periods of time. Patient to wear own compression stockings every day. - on right leg; Moisturize legs daily. - Ammonium LACTATE to BLE every day. WOUND #18: - Metatarsal head first Wound Laterality: Plantar, Left Cleanser: Normal Saline 2 x Per Week/7 Days Discharge Instructions: Cleanse the wound with Normal Saline prior to applying a clean dressing using gauze sponges, not tissue or cotton balls.  Cleanser: Soap and Water 2 x Per Week/7 Days Discharge Instructions: May shower and wash wound with dial antibacterial soap and water prior to dressing change. Cleanser: Wound Cleanser 2 x Per Week/7 Days Discharge Instructions: Cleanse the wound with wound cleanser prior to applying a clean dressing using gauze sponges, not tissue or cotton balls. Peri-Wound Care: Triamcinolone 15 (g) 2 x Per Week/7 Days Discharge Instructions: Use triamcinolone 15 (g) as directed Peri-Wound Care: Zinc Oxide Ointment 30g tube 2 x Per Week/7 Days Discharge Instructions: Apply Zinc Oxide to periwound with each dressing change Peri-Wound Care: Sween Lotion (Moisturizing lotion) 2 x Per Week/7 Days Discharge Instructions: Apply moisturizing lotion as directed Prim Dressing: Hydrofera Blue Classic Foam, 4x4 in 2 x Per Week/7 Days ary Discharge Instructions: Moisten with saline prior to applying to wound bed Secondary Dressing: ABD Pad, 5x9 2 x Per Week/7 Days Discharge  Instructions: Apply over primary dressing as directed. Secondary Dressing: Zetuvit Plus 4x8 in 2 x Per Week/7 Days Discharge Instructions: Apply over primary dressing as directed. Secondary Dressing: CarboFLEX Odor Control Dressing, 4x4 in 2 x Per Week/7 Days Discharge Instructions: Apply over primary dressing as directed. Com pression Wrap: Unnaboot w/Calamine, 4x10 (in/yd) 2 x Per Week/7 Days Discharge Instructions: Apply Unnaboot as directed. May also use Miliken CoFlex Calamine 2 layer compression system as alternative. WOUND #19: - Lower Leg Wound Laterality: Left, Anterior Cleanser: Normal Saline 2 x Per Week/7 Days Discharge Instructions: Cleanse the wound with Normal Saline prior to applying a clean dressing using gauze sponges, not tissue or cotton balls. Cleanser: Soap and Water 2 x Per Week/7 Days Discharge Instructions: May shower and wash wound with dial antibacterial soap and water prior to dressing change. Cleanser: Wound Cleanser 2 x Per Week/7 Days Discharge Instructions: Cleanse the wound with wound cleanser prior to applying a clean dressing using gauze sponges, not tissue or cotton balls. Peri-Wound Care: Triamcinolone 15 (g) 2 x Per Week/7 Days Discharge Instructions: Use triamcinolone 15 (g) as directed Peri-Wound Care: Zinc Oxide Ointment 30g tube 2 x Per Week/7 Days Discharge Instructions: Apply Zinc Oxide to periwound with each dressing change Peri-Wound Care: Sween Lotion (Moisturizing lotion) 2 x Per Week/7 Days Discharge Instructions: Apply moisturizing lotion as directed Prim Dressing: Hydrofera Blue Classic Foam, 4x4 in 2 x Per Week/7 Days ary Discharge Instructions: Moisten with saline prior to applying to wound bed Secondary Dressing: ABD Pad, 5x9 2 x Per Week/7 Days Discharge Instructions: Apply over primary dressing as directed. Secondary Dressing: Zetuvit Plus 4x8 in 2 x Per Week/7 Days Discharge Instructions: Apply over primary dressing as  directed. Secondary Dressing: CarboFLEX Odor Control Dressing, 4x4 in 2 x Per Week/7 Days Discharge Instructions: Apply over primary dressing as directed. Com pression Wrap: Unnaboot w/Calamine, 4x10 (in/yd) 2 x Per Week/7 Days Discharge Instructions: Apply Unnaboot as directed. May also use Miliken CoFlex Calamine 2 layer compression system as alternative. WOUND #21: - Lower Leg Wound Laterality: Left, Medial Cleanser: Normal Saline 2 x Per Week/7 Days Discharge Instructions: Cleanse the wound with Normal Saline prior to applying a clean dressing using gauze sponges, not tissue or cotton balls. Cleanser: Soap and Water 2 x Per Week/7 Days Discharge Instructions: May shower and wash wound with dial antibacterial soap and water prior to dressing change. Cleanser: Wound Cleanser 2 x Per Week/7 Days Discharge Instructions: Cleanse the wound with wound cleanser prior to applying a clean dressing using gauze sponges, not tissue or cotton balls. Peri-Wound Care: Triamcinolone 15 (g) 2 x Per  Week/7 Days Discharge Instructions: Use triamcinolone 15 (g) as directed Peri-Wound Care: Zinc Oxide Ointment 30g tube 2 x Per Week/7 Days Discharge Instructions: Apply Zinc Oxide to periwound with each dressing change Peri-Wound Care: Sween Lotion (Moisturizing lotion) 2 x Per Week/7 Days Discharge Instructions: Apply moisturizing lotion as directed Prim Dressing: Hydrofera Blue Classic Foam, 4x4 in 2 x Per Week/7 Days ary Discharge Instructions: Moisten with saline prior to applying to wound bed Secondary Dressing: ABD Pad, 5x9 2 x Per Week/7 Days Discharge Instructions: Apply over primary dressing as directed. Secondary Dressing: Zetuvit Plus 4x8 in 2 x Per Week/7 Days Discharge Instructions: Apply over primary dressing as directed. Secondary Dressing: CarboFLEX Odor Control Dressing, 4x4 in 2 x Per Week/7 Days Discharge Instructions: Apply over primary dressing as directed. Com pression Wrap: Unnaboot  w/Calamine, 4x10 (in/yd) 2 x Per Week/7 Days Discharge Instructions: Apply Unnaboot as directed. May also use Miliken CoFlex Calamine 2 layer compression system as alternative. 1. Change the dressing to Hydrofera Blue to all wound areas still under 4-layer compression 2. The patient retires from ENT on 1115. I am hopeful he will be able to spend a lot less time on his foot and keeping his leg elevated. 3. T contact cast on the foot may be a possibility at some point otal Electronic Signature(s) Signed: 05/04/2021 5:10:51 PM By: [name] Ham MD Entered By: [name] on 05/04/2021 10:22:37 -------------------------------------------------------------------------------- SuperBill Details Patient Name: Date of Service: [name]. 05/04/2021 Medical Record Number: [id number] Patient Account Number: [id number] Date of Birth/Sex: Treating RN: 1951-02-24 (70 y.o. [name], [name] Primary Care Provider: Jilda [name] Other Clinician: Referring Provider: Treating Provider/Extender: [name] in Treatment: 3 Diagnosis Coding ICD-10 Codes Code Description [phone] Type 2 diabetes mellitus with foot ulcer 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 I89.0 Lymphedema, not elsewhere classified I87.322 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verity 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 of other part of left foot with other specified severity E11.42 Type 2 diabetes mellitus with diabetic polyneuropathy Facility Procedures CPT4 Code: [id number] Description: 16073 - DEB SUBQ TISSUE 20 SQ CM/< ICD-10 Diagnosis Description 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 ulcer of other part of left lower leg with other specified 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 of other part of left foot with other specified  sever Modifier: severity ity Quantity: 1 CPT4 Code: [id number] Description: 85462 - DEB SUBQ TISS EA ADDL 20CM ICD-10 Diagnosis Description 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 of other part of left foot with other specified sever 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 ulcer of other part of left lower leg with other specified Modifier: ity severity Quantity: 1 Physician Procedures : CPT4 Code Description Modifier [id number] 11042 - WC PHYS SUBQ TISS 20 SQ CM ICD-10 Diagnosis Description 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verity 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 of other part of  left foot with other specified severity Quantity: 1 : [id number] 11045 - WC PHYS SUBQ TISS EA ADDL 20 CM ICD-10 Diagnosis Description 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 ulcer of other part of left lower leg with other  specified severity Quantity: 1 Electronic Signature(s) Signed: 05/04/2021 5:10:51 PM By: [name] Ham MD Entered By: [name] on 05/04/2021 10:23:13

## 2021-05-08 ENCOUNTER — Encounter (HOSPITAL_BASED_OUTPATIENT_CLINIC_OR_DEPARTMENT_OTHER): Payer: BC Managed Care – PPO | Admitting: Internal Medicine

## 2021-05-08 ENCOUNTER — Other Ambulatory Visit: Payer: Self-pay

## 2021-05-08 DIAGNOSIS — E11621 Type 2 diabetes mellitus with foot ulcer: Secondary | ICD-10-CM | POA: Diagnosis not present

## 2021-05-08 NOTE — Progress Notes (Signed)
[name], [name] ([id number]) Visit Report for 05/08/2021 Arrival Information Details Patient Name: Date of Service: [name], [name] 05/08/2021 9:45 A M Medical Record Number: [id number] Patient Account Number: [id number] Date of Birth/Sex: Treating RN: 1950-10-31 (70 y.o. [name], [name] Primary Care Kinnie Kaupp: [name] Other Clinician: Referring Orien Mayhall: Treating Jalynn Waddell/Extender: Cornelious Bryant in Treatment: 3 Visit Information History Since Last Visit Added or deleted any medications: No Patient Arrived: Ambulatory Any new allergies or adverse reactions: No Arrival Time: 10:15 Had a fall or experienced change in No Accompanied By: self activities of daily living that may affect Transfer Assistance: None risk of falls: Patient Identification Verified: Yes Signs or symptoms of abuse/neglect since last visito No Secondary Verification Process Completed: Yes Hospitalized since last visit: No Patient Requires Transmission-Based Precautions: No Implantable device outside of the clinic excluding No Patient Has Alerts: Yes cellular tissue based products placed in the center Patient Alerts: ABI's: 09/22 L:1.09 R: 1. since last visit: TBI's: R:0.72 L:0.99 Has Dressing in Place as Prescribed: Yes Has Compression in Place as Prescribed: Yes Pain Present Now: No Electronic Signature(s) Signed: 05/08/2021 4:37:29 PM By: Deon Pilling RN, BSN Entered By: Deon Pilling on 05/08/2021 10:57:06 -------------------------------------------------------------------------------- Compression Therapy Details Patient Name: Date of Service: [name]. 05/08/2021 9:45 A M Medical Record Number: [id number] Patient Account Number: [id number] Date of Birth/Sex: Treating RN: 11-Jul-1950 (70 y.o. [name] Primary Care Shannia Jacuinde: [name] Other Clinician: Referring Kiyona Mcnall: Treating [name] [name]/Extender: Cornelious Bryant in Treatment:  3 Compression Therapy Performed for Wound Assessment: Wound #18 Left,Plantar Metatarsal head first Performed By: Clinician Deon Pilling, RN Compression Type: Rolena Infante Electronic Signature(s) Signed: 05/08/2021 4:37:29 PM By: Deon Pilling RN, BSN Entered By: Deon Pilling on 05/08/2021 11:01:06 -------------------------------------------------------------------------------- Compression Therapy Details Patient Name: Date of Service: [name]. 05/08/2021 9:45 A M Medical Record Number: [id number] Patient Account Number: [id number] Date of Birth/Sex: Treating RN: 08-28-1950 (70 y.o. [name] Primary Care Kourtlyn Charlet: [name] Other Clinician: Referring Michelangelo Rindfleisch: Treating Marijose Curington/Extender: Cornelious Bryant in Treatment: 3 Compression Therapy Performed for Wound Assessment: Wound #21 Left,Medial Lower Leg Performed By: Clinician Deon Pilling, RN Compression Type: Rolena Infante Electronic Signature(s) Signed: 05/08/2021 4:37:29 PM By: Deon Pilling RN, BSN Entered By: Deon Pilling on 05/08/2021 11:01:06 -------------------------------------------------------------------------------- Compression Therapy Details Patient Name: Date of Service: [name]. 05/08/2021 9:45 A M Medical Record Number: [id number] Patient Account Number: [id number] Date of Birth/Sex: Treating RN: Nov 19, 1950 (70 y.o. [name] Primary Care Marykathryn Carboni: [name] Other Clinician: Referring Zainah Steven: Treating King Pinzon/Extender: Cornelious Bryant in Treatment: 3 Compression Therapy Performed for Wound Assessment: Wound #19 Left,Anterior Lower Leg Performed By: Clinician Deon Pilling, RN Compression Type: Rolena Infante Electronic Signature(s) Signed: 05/08/2021 4:37:29 PM By: Deon Pilling RN, BSN Entered By: Deon Pilling on 05/08/2021 11:01:06 -------------------------------------------------------------------------------- Encounter Discharge  Information Details Patient Name: Date of Service: [name]. 05/08/2021 9:45 A M Medical Record Number: [id number] Patient Account Number: [id number] Date of Birth/Sex: Treating RN: Oct 14, 1950 (70 y.o. [name] Primary Care Korde Jeppsen: [name] Other Clinician: Referring Chancelor Hardrick: Treating Klea Nall/Extender: Cornelious Bryant in Treatment: 3 Encounter Discharge Information Items Discharge Condition: Stable Ambulatory Status: Ambulatory Discharge Destination: Home Transportation: Private Auto Accompanied By: self Schedule Follow-up Appointment: Yes Clinical Summary of Care: Electronic Signature(s) Signed: 05/08/2021 4:37:29 PM By: Deon Pilling RN, BSN Entered By: Deon Pilling on 05/08/2021 11:01:41 -------------------------------------------------------------------------------- Patient/Caregiver Education Details Patient Name:  Date of Service: [name], [name] 11/14/2022andnbsp9:45 A M Medical Record Number: [id number] Patient Account Number: [id number] Date of Birth/Gender: Treating RN: 1951-06-22 (70 y.o. [name] Primary Care Physician: [name] Other Clinician: Referring Physician: Treating Physician/Extender: Cornelious Bryant in Treatment: 3 Education Assessment Education Provided To: Patient Education Topics Provided Infection: Handouts: Infection Prevention and Management Methods: Explain/Verbal Responses: Reinforcements needed Electronic Signature(s) Signed: 05/08/2021 4:37:29 PM By: Deon Pilling RN, BSN Entered By: Deon Pilling on 05/08/2021 11:01:33 -------------------------------------------------------------------------------- Wound Assessment Details Patient Name: Date of Service: [name]. 05/08/2021 9:45 A M Medical Record Number: [id number] Patient Account Number: [id number] Date of Birth/Sex: Treating RN: 04/13/1951 (70 y.o. [name], [name] Primary Care Kaleem Sartwell:  [name] Other Clinician: Referring Ege Muckey: Treating Ramiel Forti/Extender: Cornelious Bryant in Treatment: 3 Wound Status Wound Number: 18 Primary Etiology: Diabetic Wound/Ulcer of the Lower Extremity Wound Location: Left, Plantar Metatarsal head first Wound Status: Open Wounding Event: Gradually Appeared Date Acquired: 08/23/2020 Weeks Of Treatment: 3 Clustered Wound: No Wound Measurements Length: (cm) 4 Width: (cm) 5 Depth: (cm) 0.1 Area: (cm) 15.708 Volume: (cm) 1.571 % Reduction in Area: -11.1% % Reduction in Volume: 44.4% Wound Description Classification: Grade 2 Exudate Amount: Large Exudate Type: Serosanguineous Exudate Color: red, brown Treatment Notes Wound #18 (Metatarsal head first) Wound Laterality: Plantar, Left Cleanser Normal Saline Discharge Instruction: Cleanse the wound with Normal Saline prior to applying a clean dressing using gauze sponges, not tissue or cotton balls. Soap and Water Discharge Instruction: May shower and wash wound with dial antibacterial soap and water prior to dressing change. Wound Cleanser Discharge Instruction: Cleanse the wound with wound cleanser prior to applying a clean dressing using gauze sponges, not tissue or cotton balls. Peri-Wound Care Triamcinolone 15 (g) Discharge Instruction: Use triamcinolone 15 (g) as directed Zinc Oxide Ointment 30g tube Discharge Instruction: Apply Zinc Oxide to periwound with each dressing change Sween Lotion (Moisturizing lotion) Discharge Instruction: Apply moisturizing lotion as directed Topical Primary Dressing Hydrofera Blue Classic Foam, 4x4 in Discharge Instruction: Moisten with saline prior to applying to wound bed Secondary Dressing ABD Pad, 5x9 Discharge Instruction: Apply over primary dressing as directed. Zetuvit Plus 4x8 in Discharge Instruction: Apply over primary dressing as directed. CarboFLEX Odor Control Dressing, 4x4 in Discharge Instruction:  Apply over primary dressing as directed. Secured With Compression Wrap Unnaboot w/Calamine, 4x10 (in/yd) Discharge Instruction: Apply Unnaboot as directed. May also use Miliken CoFlex Calamine 2 layer compression system as alternative. Compression Stockings Add-Ons Electronic Signature(s) Signed: 05/08/2021 4:37:29 PM By: Deon Pilling RN, BSN Entered By: Deon Pilling on 05/08/2021 10:57:51 -------------------------------------------------------------------------------- Wound Assessment Details Patient Name: Date of Service: [name]. 05/08/2021 9:45 A M Medical Record Number: [id number] Patient Account Number: [id number] Date of Birth/Sex: Treating RN: [birth year] (70 y.o. [name], [name] Primary Care Carolynne Schuchard: [name] Other Clinician: Referring Timica Marcom: Treating Eliya Bubar/Extender: Cornelious Bryant in Treatment: 3 Wound Status Wound Number: 19 Primary Etiology: Diabetic Wound/Ulcer of the Lower Extremity Wound Location: Left, Anterior Lower Leg Wound Status: Open Wounding Event: Gradually Appeared Date Acquired: 08/23/2020 Weeks Of Treatment: 3 Clustered Wound: No Wound Measurements Length: (cm) 4.5 Width: (cm) 3.7 Depth: (cm) 0.3 Area: (cm) 13.077 Volume: (cm) 3.923 % Reduction in Area: 9.5% % Reduction in Volume: 9.5% Wound Description Classification: Grade 2 Exudate Amount: Large Exudate Type: Purulent Exudate Color: yellow, brown, green Treatment Notes Wound #19 (Lower Leg) Wound Laterality: Left, Anterior Cleanser Normal Saline Discharge Instruction: Cleanse the wound  with Normal Saline prior to applying a clean dressing using gauze sponges, not tissue or cotton balls. Soap and Water Discharge Instruction: May shower and wash wound with dial antibacterial soap and water prior to dressing change. Wound Cleanser Discharge Instruction: Cleanse the wound with wound cleanser prior to applying a clean dressing using gauze sponges,  not tissue or cotton balls. Peri-Wound Care Triamcinolone 15 (g) Discharge Instruction: Use triamcinolone 15 (g) as directed Zinc Oxide Ointment 30g tube Discharge Instruction: Apply Zinc Oxide to periwound with each dressing change Sween Lotion (Moisturizing lotion) Discharge Instruction: Apply moisturizing lotion as directed Topical Primary Dressing Hydrofera Blue Classic Foam, 4x4 in Discharge Instruction: Moisten with saline prior to applying to wound bed Secondary Dressing ABD Pad, 5x9 Discharge Instruction: Apply over primary dressing as directed. Zetuvit Plus 4x8 in Discharge Instruction: Apply over primary dressing as directed. CarboFLEX Odor Control Dressing, 4x4 in Discharge Instruction: Apply over primary dressing as directed. Secured With Compression Wrap Unnaboot w/Calamine, 4x10 (in/yd) Discharge Instruction: Apply Unnaboot as directed. May also use Miliken CoFlex Calamine 2 layer compression system as alternative. Compression Stockings Add-Ons Electronic Signature(s) Signed: 05/08/2021 4:37:29 PM By: Deon Pilling RN, BSN Entered By: Deon Pilling on 05/08/2021 10:57:51 -------------------------------------------------------------------------------- Wound Assessment Details Patient Name: Date of Service: [name]. 05/08/2021 9:45 A M Medical Record Number: [id number] Patient Account Number: [id number] Date of Birth/Sex: Treating RN: 11-Feb-1951 (70 y.o. [name], [name] Primary Care Hennessey Cantrell: [name] Other Clinician: Referring Levelle Edelen: Treating Rector Devonshire/Extender: Cornelious Bryant in Treatment: 3 Wound Status Wound Number: 21 Primary Etiology: Diabetic Wound/Ulcer of the Lower Extremity Wound Location: Left, Medial Lower Leg Wound Status: Open Wounding Event: Gradually Appeared Date Acquired: 08/23/2020 Weeks Of Treatment: 3 Clustered Wound: No Wound Measurements Length: (cm) 1.9 Width: (cm) 1 Depth: (cm) 0.4 Area:  (cm) 1.492 Volume: (cm) 0.597 % Reduction in Area: 49.3% % Reduction in Volume: 32.5% Wound Description Classification: Grade 2 Exudate Amount: Medium Exudate Type: Purulent Exudate Color: yellow, brown, green Treatment Notes Wound #21 (Lower Leg) Wound Laterality: Left, Medial Cleanser Normal Saline Discharge Instruction: Cleanse the wound with Normal Saline prior to applying a clean dressing using gauze sponges, not tissue or cotton balls. Soap and Water Discharge Instruction: May shower and wash wound with dial antibacterial soap and water prior to dressing change. Wound Cleanser Discharge Instruction: Cleanse the wound with wound cleanser prior to applying a clean dressing using gauze sponges, not tissue or cotton balls. Peri-Wound Care Triamcinolone 15 (g) Discharge Instruction: Use triamcinolone 15 (g) as directed Zinc Oxide Ointment 30g tube Discharge Instruction: Apply Zinc Oxide to periwound with each dressing change Sween Lotion (Moisturizing lotion) Discharge Instruction: Apply moisturizing lotion as directed Topical Primary Dressing Hydrofera Blue Classic Foam, 4x4 in Discharge Instruction: Moisten with saline prior to applying to wound bed Secondary Dressing ABD Pad, 5x9 Discharge Instruction: Apply over primary dressing as directed. Zetuvit Plus 4x8 in Discharge Instruction: Apply over primary dressing as directed. CarboFLEX Odor Control Dressing, 4x4 in Discharge Instruction: Apply over primary dressing as directed. Secured With Compression Wrap Unnaboot w/Calamine, 4x10 (in/yd) Discharge Instruction: Apply Unnaboot as directed. May also use Miliken CoFlex Calamine 2 layer compression system as alternative. Compression Stockings Add-Ons Electronic Signature(s) Signed: 05/08/2021 4:37:29 PM By: Deon Pilling RN, BSN Entered By: Deon Pilling on 05/08/2021 10:57:51 -------------------------------------------------------------------------------- Vitals  Details Patient Name: Date of Service: [name]. 05/08/2021 9:45 A M Medical Record Number: [id number] Patient Account Number: [id number] Date of Birth/Sex: Treating RN: Jun 27, 1950 (70  y.o. [name] Primary Care Latricia Cerrito: [name] Other Clinician: Referring Sammy Cassar: Treating Adonys Wildes/Extender: Cornelious Bryant in Treatment: 3 Vital Signs Time Taken: 10:15 Temperature (F): 98.4 Height (in): 74 Pulse (bpm): 68 Weight (lbs): 238 Respiratory Rate (breaths/min): 20 Body Mass Index (BMI): 30.6 Blood Pressure (mmHg): 164/82 Reference Range: 80 - 120 mg / dl Electronic Signature(s) Signed: 05/08/2021 4:37:29 PM By: Deon Pilling RN, BSN Entered By: Deon Pilling on 05/08/2021 10:57:34

## 2021-05-08 NOTE — Progress Notes (Signed)
[name], [name] ([id number]) Visit Report for 05/08/2021 SuperBill Details Patient Name: Date of Service: ALIF, PETRAK 05/08/2021 Medical Record Number: [id number] Patient Account Number: [id number] Date of Birth/Sex: Treating RN: Sep 27, 1950 (70 y.o. 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

## 2021-05-11 ENCOUNTER — Encounter (HOSPITAL_BASED_OUTPATIENT_CLINIC_OR_DEPARTMENT_OTHER): Payer: BC Managed Care – PPO | Admitting: Internal Medicine

## 2021-05-11 ENCOUNTER — Other Ambulatory Visit: Payer: Self-pay

## 2021-05-11 NOTE — Progress Notes (Signed)
[name], [name] ([id number]) Visit Report for 05/11/2021 HPI Details Patient Name: Date of Service: [name], [name] 05/11/2021 9:00 A M Medical Record Number: [id number] Patient Account Number: [id number] Date of Birth/Sex: Treating RN: 04/09/1951 (70 y.o. [name] Primary Care Provider: [name] Other Clinician: Referring Provider: Treating Provider/Extender: Stormy Card in Treatment: 4 History of Present Illness HPI Description: 10/11/17; Mr. Illes is a 70 year old man who tells me that in 2015 he slipped down the latter traumatizing his left leg. He developed a wound in the same spot the area that we are currently looking at. He states this closed over for the most part although he always felt it was somewhat unstable. In 2016 he hit the same area with the door of his car had this reopened. He tells me that this is never really closed although sometimes an inflow it remains open on a constant basis. He has not been using any specific dressing to this except for topical antibiotics the nature of which were not really sure. His primary doctor did send him to see Dr. [name] of interventional cardiology. He underwent an angiogram on 08/06/17 and he underwent a PTA and directional atherectomy of the lesser distal SFA and popliteal arteries which resulted in brisk improvement in blood flow. It was noted that he had 2 vessel runoff through the anterior tibial and peroneal. He is also been to see vascular and interventional radiologist. He was not felt to have any significant superficial venous insufficiency. Presumably is not a candidate for any ablation. It was suggested he come here for wound care. The patient is a type II diabetic on insulin. He also has a history of venous insufficiency. ABIs on the left were noncompressible in our clinic 10/21/17; patient we admitted to the clinic last week. 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 We put Iodosorb on him after an aggressive debridement and 3 layer compression. He complained of pain in his ankle and itching with is skin in fact he scratched the area on the medial calf superiorly at the rim of our wraps and he has 2 small open areas in that location today which are new. I changed his primary dressing today to silver collagen. As noted he is already had revascularization and does not have any significant superficial venous insufficiency that would be amenable to ablation 10/28/17; patient admitted to the clinic 2 weeks ago. He has a smaller Wound. Scratch injury from last week revealed. There is large wound over the tibial area. This is smaller. Granulation looks healthy. No need for debridement. 11/04/17; the wound on the left lateral calf looks better. Improved dimensions. Surface of this looks better. We've been maintaining him and Kerlix Coban wraps. He finds this much more comfortable. Silver collagen dressing 11/11/17; left lateral Wound continues to look healthy be making progress. Using a #5 curet I removed removed nonviable skin from the surface of the wound and then necrotic debris from the wound surface. Surface of the wound continues to look healthy. He also has an open area on the left great toenail bed. We've been using topical antibiotics. 11/19/17; left anterior lateral wound continues to look healthy but it's not closed. He also had a small wound above this on the left leg Initially traumatic wounds in the setting of significant chronic venous insufficiency and stasis dermatitis 11/25/17; left anterior wounds superiorly is closed still a small wound inferiorly.  12/02/17; left anterior tibial area. Arrives today with adherent callus. Post debridement clearly not completely closed. Hydrofera Blue under 3 layer compression. 12/09/17; left anterior tibia. Circumferential eschar however the wound bed looks stable to improved. We've been  using Hydrofera Blue under 3 layer compression 12/17/17; left anterior tibia. Apparently this was felt to be closed however when the wrap was taken off there is a skin tear to reopen wounds in the same area we've been using Hydrofera Blue under 3 layer compression 12/23/17 left anterior tibia. Not close to close this week apparently the [facility] was stuck to this again. Still circumferential eschar requiring debridement. I put a contact layer on this this time under the Hydrofera Blue 12/31/17; left anterior tibia. Wound is better slight amount of hyper-granulation. Using Hydrofera Blue over Adaptic. 01/07/18; left anterior tibia. The wound had some surface eschar however after this was removed he has no open wound.he was already revascularized by Dr. [name] when he came to our clinic with atherectomy of the left SFA and popliteal artery. He was also sent to interventional radiology for venous reflux studies. He was not felt to have significant reflux but certainly has chronic venous changes of his skin with hemosiderin deposition around this area. He will definitely need to lubricate his skin and wear compression stocking and I've talked to him about this. READMISSION 05/26/2018 This is a now 70 year old man we cared for with traumatic wounds on his left anterior lower extremity. He had been previously revascularized during that admission by Dr. [name]. Apparently in follow-up Dr. [name] noted that he had deterioration in his arterial status. He underwent a stent placement in the distal left SFA on 04/22/2018. 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 He went back to the angiography suite on 04/30/2018 he underwent PTA and balloon angioplasty of the occluded left mid anterior tibial artery, thrombotic occlusion went from 100 to 0% which reconstitutes the posterior tibial artery. He had thrombectomy and aspiration of the peroneal artery. The stent placed in the distal SFA left SFA was  still occluded. 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 He has been recuperating at [facility] after the most recent hospitalization. He is going home tomorrow. He tells me that 2 weeks ago he traumatized the tip of his left fifth toe. He came in urgently for our review of this. This was a history of before I noted that Dr. [name] had already noted dry gangrenous changes of the left fifth toe 06/09/2018; 2-week follow-up. I did contact Dr. [name] after his last appointment and he apparently saw 1 of Dr. Irven Shelling colleagues the next day. He does not follow-up with Dr. [name] himself until Thursday of this week. 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 Nevertheless there is no evidence of infection no drainage and no pain. He had a new area that this week when we were signing him in today on the left anterior mid tibia area, this is in close proximity to the previous wound we have dealt with in this clinic. 06/23/2018; 2-week follow-up. I did not receive a recent note from Dr. [name] to review today. Our office is trying to obtain this. He is  apparently not planning to do further vascular interventions and wondered about compression to try and help with the patient's chronic venous insufficiency. However we are also concerned about the arterial flow. He arrives in clinic today with a new area on the left third toe. The areas on the calf/anterior tibia are close to closing. The left fifth toe is still mummified using Betadine. -In reviewing things with the patient he has what sounds like claudication with mild to moderate amount of activity. 06/27/2018; x-ray of his foot suggested osteomyelitis of the left third  toe. I prescribed Levaquin over the phone while we attempted to arrange a plan of care. However the patient called yesterday to report he had low-grade fever and he came in today acutely. There is been a marked deterioration in the left third toe with spreading cellulitis up into the dorsal left foot. He was referred to the emergency room. Readmission: 06/29/2020 patient presents today for reevaluation here in our clinic he was previously treated by Dr. Dellia Nims at the latter part of 2019 in 2 the beginning of 2020. Subsequently we have not seen him since that time in the interim he did have evaluation with vein and vascular specialist specifically Dr. Anice Paganini who did perform quite extensive work for a left femoral to anterior tibial artery bypass. With that being said in the interim the patient has developed significant lymphedema and has wounds that he tells me have really never healed in regard to the incision site on the left leg. He also has multiple wounds on the feet for various reasons some of which is that he tends to pick at his feet. Fortunately there is no signs of active infection systemically at this time he does have some wounds that are little bit deeper but most are fairly superficial he seems to have good blood flow and overall everything appears to be healthy I see no bone exposed and no obvious signs of osteomyelitis. I do not know that he necessarily needs a x-ray at this point although that something we could consider depending on how things progress. The patient does have a history of lymphedema, diabetes, this is type II, chronic kidney disease stage III, hypertension, and history of peripheral vascular disease. 07/05/2020; patient admitted last week. Is a patient I remember from 2019 he had a spreading infection involving the left foot and we sent him to the hospital. 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 He subsequently had a left  femoral to anterior tibial bypass by Dr.Cain vein and vascular. He also has severe lymphedema with chronic skin changes related to that on the left leg. The most problematic area that was new today was on the left medial great toe. This was apparently a small area last week there was purulent drainage which our intake nurse cultured. Also areas on the left medial foot and heel left lateral foot. He has 2 areas on the left medial calf left lateral calf in the setting of the severe lymphedema. 07/13/2020 on evaluation today patient appears to be doing better in my opinion compared to his last visit. The good news is there is no signs of active infection systemically and locally I do not see any signs of infection either. He did have an x-ray which was negative that is great news he had a culture which showed MRSA but at the same time he is been on the doxycycline which has helped. I do think we  may want to extend this for 7 additional days 1/25; patient admitted to the clinic a few weeks ago. 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 We have been putting him under compression his edema control is a lot better but he is severe verricused skin on the left leg. He is really done quite well he still has an open area on the left medial calf and the left medial first metatarsal head. We have been using silver collagen on the leg silver alginate on the foot 07/27/2020 upon evaluation today patient appears to be doing decently well in regard to his wounds. He still has a lot of dry skin on the left leg. Some of this is starting to peel back and I think he may be able to have them out by removing some that today. Fortunately there is no signs of active infection at this time on the left leg although on the right leg he does appear to have swelling and erythema as well as some mild warmth to touch. This does have been concerned about the possibility of cellulitis although  within the differential diagnosis I do think that potentially a DVT has to be at least considered. We need to rule that out before proceeding would just call in the cellulitis. Especially since he is having pain in the posterior aspect of his calf muscle. 2/8; the patient had seen sparingly. He has severe skin changes of chronic lymphedema in the left leg thickened hyperkeratotic verrucous skin. He has an open wound on the medial part of the left first met head left mid tibia. He also has a rim of nonepithelialized skin in the anterior mid tibia. He brought in the AmLactin lotion that was been prescribed although I am not sure under compression and its utility. There concern about cellulitis on the right lower leg the last time he was here. He was put on on antibiotics. His DVT rule out was negative. The right leg looks fine he is using his stocking on this area 08/10/2020 upon evaluation today patient appears to be doing well with regard to his leg currently. He has been tolerating the dressing changes without complication. Fortunately there is no signs of active infection which is great news. Overall very pleased with where things stand. 2/22; the patient still has an area on the medial part of the left first met his head. This looks better than when I last saw this earlier this month he has a rim of epithelialization but still some surface debris. Mostly everything on the left leg is healed. There is still a vulnerable in the left mid tibia area. 08/30/2020 upon evaluation today patient appears to be doing much better in regard to his wounds on his foot. 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 Nonetheless I think we will address that today I Minna send in a prescription for him in that regard. Overall though there does not appear to be any signs of significant  worsening. 09/07/2020 on evaluation today patient's wounds over his left foot appear to be doing excellent. I do not see any signs of infection there is some callus buildup this can require debridement for certain but overall I feel like he is managing quite nicely. He still using the AmLactin cream which has been beneficial for him as well. 3/22; left  foot wound is closed. There is no open area here. He is using ammonium lactate lotion to the lower extremities to help exfoliate dry cracked skin. He has compression stockings from elastic therapy in [place]. The wound on the medial part of his left first met head is healed today. READMISSION 04/12/2021 Mr. Lisby is a patient we know fairly well he had a prolonged stay in clinic in 2019 with wounds on his left lateral and left anterior lower extremity in the setting of chronic venous insufficiency. More recently he was here earlier this year with predominantly an area on his left foot first metatarsal head plantar and he says the plantar foot broke down on its not long after we discharged him but he did not come back here. The last few months areas of broken down on his left anterior and again the left lateral lower extremity. The leg itself is very swollen chronically enlarged a lot of hyperkeratotic dry Berry Q skin in the left lower leg. His edema extends well into the thigh. He was seen by Dr. Donzetta Matters. He had ABIs on 03/02/2021 showing an ABI on the right of 1 with a TBI of 0.72 his ABI in the left at 1.09 TBI of 0.99. Monophasic and biphasic waveforms on the right. On the left monophasic waveforms were noted he went on to have an angiogram on 03/27/2021 this showed the aortic aortic and iliac segments were free of flow-limiting stenosis the left common femoral vein to evaluate the left femoral to anterior tibial artery bypass was unobstructed the bypass was patent without any areas of stenosis. We discharged the patient in bilateral juxta lite  stockings but very clearly that was not sufficient to control the swelling and maintain skin integrity. He is clearly going to need compression pumps. 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 This is fortunate because he is on his feet for long periods of time. 10/27; patient comes in with our intake nurse reporting copious amount of green drainage from the left anterior mid tibia the left dorsal foot and to a lesser extent the left medial mid tibia. We left the compression wrap on all week for the amount of edema in his left leg is quite a bit better. We use silver alginate as the primary dressing 11/3; edema control is good. Left anterior lower leg left medial lower leg and the plantar first metatarsal head. The left anterior lower leg required debridement. Deep tissue culture I did of this wound showed MRSA I put him on 10 days of doxycycline which she will start today. We have him in compression wraps. He has a security card and [employer] however he is retiring on November 15. We will need to then get him into a better offloading boot for the left foot perhaps a total contact cast 11/10; edema control is quite good. Left anterior and left medial lower leg wounds in the setting of chronic venous insufficiency and lymphedema. He also has a substantial area over the left plantar first metatarsal head. 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 He has significant hypergranulation on the left plantar foot wound. The patient is a diabetic but he does not have significant PAD 11/17; edema control is quite good. Left anterior and left medial lower leg wounds look better. The really concerning area remains the  area on the left plantar first metatarsal head. He has a rim of epithelialization. He has been using a surgical shoe The patient is now retired from a a  [employer] I have gone over with him the need to offload this area aggressively. Starting today with a forefoot off loader but . possibly a total contact cast. 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 05/11/2021 9:00 A M Medical Record Number: [id number] Patient Account Number: [id number] Date of Birth/Sex: Treating RN: 03/14/1951 (70 y.o. [name] Primary Care Provider: [name] Other Clinician: Referring Provider: Treating Provider/Extender: Stormy Card in Treatment: 4 Constitutional Patient is hypertensive.. Pulse regular and within target range for patient.Marland Kitchen Respirations regular, non-labored and within target range.. Temperature is normal and within the target range for the patient.Marland Kitchen Appears in no distress. Cardiovascular Pedal pulses are palpable. Notes Wound exam; left anterior lower leg no debridement is required. The wounds looks healthier in terms of surface. The area that concerns me is the substantial area over the plantar left first metatarsal head. I did a very aggressive debridement on this last week. Rim of epithelialization is encouraging there is no evidence of infection this does not probe to bone Electronic Signature(s) Signed: 05/11/2021 5:16:34 PM By: Linton Ham MD Entered By: Linton Ham on 05/11/2021 10:08:01 -------------------------------------------------------------------------------- Physician Orders Details Patient Name: Date of Service: [name]. 05/11/2021 9:00 A M Medical Record Number: [id number] Patient Account Number: [id number] Date of Birth/Sex: Treating RN: 1951-04-17 (70 y.o. [name],  [name] Primary Care Provider: [name] Other Clinician: Referring Provider: Treating Provider/Extender: Stormy Card in Treatment: 4 Verbal / Phone Orders: No Diagnosis Coding Follow-up Appointments ppointment in 2 weeks. - Dr. Dellia Nims on Thursday Return A Nurse Visit: - 11/21 and 11/28 Bathing/ Shower/ Hygiene May shower with protection but do not get wound dressing(s) wet. - Use a cast protector so you can shower without getting your wrap(s) wet Edema Control - Lymphedema / SCD / Other Elevate legs to the level of the heart or above for 30 minutes daily and/or when sitting, a frequency of: Avoid standing for long periods of time. Patient to wear own compression stockings every day. - on right leg; Moisturize legs daily. - Ammonium LACTATE to BLE every day. Off-Loading Wedge shoe to: - left front foot offloader Wound Treatment Wound #18 - Metatarsal head first Wound Laterality: Plantar, Left Cleanser: Normal Saline 2 x Per Week/7 Days Discharge Instructions: Cleanse the wound with Normal Saline prior to applying a clean dressing using gauze sponges, not tissue or cotton balls. Cleanser: Soap and Water 2 x Per Week/7 Days Discharge Instructions: May shower and wash wound with dial antibacterial soap and water prior to dressing change. Cleanser: Wound Cleanser 2 x Per Week/7 Days Discharge Instructions: Cleanse the wound with wound cleanser prior to applying a clean dressing using gauze sponges, not tissue or cotton balls. Peri-Wound Care: Triamcinolone 15 (g) 2 x Per Week/7 Days Discharge Instructions: Use triamcinolone 15 (g) as directed Peri-Wound Care: Zinc Oxide Ointment 30g tube 2 x Per Week/7 Days Discharge Instructions: Apply Zinc Oxide to periwound with each dressing change Peri-Wound Care: Sween Lotion (Moisturizing lotion) 2 x Per Week/7 Days Discharge Instructions: Apply moisturizing lotion as directed Prim Dressing: Hydrofera  Blue Classic Foam,  4x4 in 2 x Per Week/7 Days ary Discharge Instructions: Moisten with saline prior to applying to wound bed Secondary Dressing: ABD Pad, 5x9 2 x Per Week/7 Days Discharge Instructions: Apply over primary dressing as directed. Secondary Dressing: Zetuvit Plus 4x8 in 2 x Per Week/7 Days Discharge Instructions: Apply over primary dressing as directed. Secondary Dressing: CarboFLEX Odor Control Dressing, 4x4 in 2 x Per Week/7 Days Discharge Instructions: Apply over primary dressing as directed. Compression Wrap: Unnaboot w/Calamine, 4x10 (in/yd) 2 x Per Week/7 Days Discharge Instructions: Apply Unnaboot as directed. May also use Miliken CoFlex Calamine 2 layer compression system as alternative. Wound #19 - Lower Leg Wound Laterality: Left, Anterior Cleanser: Normal Saline 2 x Per Week/7 Days Discharge Instructions: Cleanse the wound with Normal Saline prior to applying a clean dressing using gauze sponges, not tissue or cotton balls. Cleanser: Soap and Water 2 x Per Week/7 Days Discharge Instructions: May shower and wash wound with dial antibacterial soap and water prior to dressing change. Cleanser: Wound Cleanser 2 x Per Week/7 Days Discharge Instructions: Cleanse the wound with wound cleanser prior to applying a clean dressing using gauze sponges, not tissue or cotton balls. Peri-Wound Care: Triamcinolone 15 (g) 2 x Per Week/7 Days Discharge Instructions: Use triamcinolone 15 (g) as directed Peri-Wound Care: Zinc Oxide Ointment 30g tube 2 x Per Week/7 Days Discharge Instructions: Apply Zinc Oxide to periwound with each dressing change Peri-Wound Care: Sween Lotion (Moisturizing lotion) 2 x Per Week/7 Days Discharge Instructions: Apply moisturizing lotion as directed Prim Dressing: Hydrofera Blue Classic Foam, 4x4 in 2 x Per Week/7 Days ary Discharge Instructions: Moisten with saline prior to applying to wound bed Secondary Dressing: ABD Pad, 5x9 2 x Per Week/7 Days Discharge Instructions:  Apply over primary dressing as directed. Secondary Dressing: Zetuvit Plus 4x8 in 2 x Per Week/7 Days Discharge Instructions: Apply over primary dressing as directed. Secondary Dressing: CarboFLEX Odor Control Dressing, 4x4 in 2 x Per Week/7 Days Discharge Instructions: Apply over primary dressing as directed. Compression Wrap: Unnaboot w/Calamine, 4x10 (in/yd) 2 x Per Week/7 Days Discharge Instructions: Apply Unnaboot as directed. May also use Miliken CoFlex Calamine 2 layer compression system as alternative. Wound #21 - Lower Leg Wound Laterality: Left, Medial Cleanser: Normal Saline 2 x Per Week/7 Days Discharge Instructions: Cleanse the wound with Normal Saline prior to applying a clean dressing using gauze sponges, not tissue or cotton balls. Cleanser: Soap and Water 2 x Per Week/7 Days Discharge Instructions: May shower and wash wound with dial antibacterial soap and water prior to dressing change. Cleanser: Wound Cleanser 2 x Per Week/7 Days Discharge Instructions: Cleanse the wound with wound cleanser prior to applying a clean dressing using gauze sponges, not tissue or cotton balls. Peri-Wound Care: Triamcinolone 15 (g) 2 x Per Week/7 Days Discharge Instructions: Use triamcinolone 15 (g) as directed Peri-Wound Care: Zinc Oxide Ointment 30g tube 2 x Per Week/7 Days Discharge Instructions: Apply Zinc Oxide to periwound with each dressing change Peri-Wound Care: Sween Lotion (Moisturizing lotion) 2 x Per Week/7 Days Discharge Instructions: Apply moisturizing lotion as directed Prim Dressing: Hydrofera Blue Classic Foam, 4x4 in 2 x Per Week/7 Days ary Discharge Instructions: Moisten with saline prior to applying to wound bed Secondary Dressing: ABD Pad, 5x9 2 x Per Week/7 Days Discharge Instructions: Apply over primary dressing as directed. Secondary Dressing: Zetuvit Plus 4x8 in 2 x Per Week/7 Days Discharge Instructions: Apply over primary dressing as directed. Secondary Dressing:  CarboFLEX Odor Control Dressing,  4x4 in 2 x Per Week/7 Days Discharge Instructions: Apply over primary dressing as directed. Compression Wrap: Unnaboot w/Calamine, 4x10 (in/yd) 2 x Per Week/7 Days Discharge Instructions: Apply Unnaboot as directed. May also use Miliken CoFlex Calamine 2 layer compression system as alternative. Electronic Signature(s) Signed: 05/11/2021 5:03:29 PM By: Rhae Hammock RN Signed: 05/11/2021 5:16:34 PM By: Linton Ham MD Entered By: Rhae Hammock on 05/11/2021 10:16:51 -------------------------------------------------------------------------------- Problem List Details Patient Name: Date of Service: [name]. 05/11/2021 9:00 A M Medical Record Number: [id number] Patient Account Number: [id number] Date of Birth/Sex: Treating RN: 02/24/1951 (70 y.o. [name], [name] Primary Care Provider: [name] Other Clinician: Referring Provider: Treating Provider/Extender: Stormy Card in Treatment: 4 Active Problems ICD-10 Encounter Code Description Active Date MDM Diagnosis E11.621 Type 2 diabetes mellitus with foot ulcer 04/12/2021 No Yes 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 04/12/2021 No Yes I89.0 Lymphedema, not elsewhere classified 04/12/2021 No Yes I87.322 Chronic venous hypertension (idiopathic) with inflammation of left lower 04/12/2021 No Yes extremity 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 ulcer of other part of left lower leg with other specified 04/12/2021 No Yes severity 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 of other part of left foot with other specified 04/12/2021 No Yes severity E11.42 Type 2 diabetes mellitus with diabetic polyneuropathy 04/12/2021 No Yes Inactive Problems Resolved Problems Electronic Signature(s) Signed: 05/11/2021 5:16:34 PM By: Linton Ham MD Entered By: Linton Ham on 05/11/2021  10:04:53 -------------------------------------------------------------------------------- Progress Note Details Patient Name: Date of Service: [name]. 05/11/2021 9:00 A M Medical Record Number: [id number] Patient Account Number: [id number] Date of Birth/Sex: Treating RN: 01-16-1951 (70 y.o. [name] Primary Care Provider: [name] Other Clinician: Referring Provider: Treating Provider/Extender: Stormy Card in Treatment: 4 Subjective History of Present Illness (HPI) 10/11/17; Mr. Montminy is a 70 year old man who tells me that in 2015 he slipped down the latter traumatizing his left leg. He developed a wound in the same spot the area that we are currently looking at. He states this closed over for the most part although he always felt it was somewhat unstable. In 2016 he hit the same area with the door of his car had this reopened. He tells me that this is never really closed although sometimes an inflow it remains open on a constant basis. He has not been using any specific dressing to this except for topical antibiotics the nature of which were not really sure. His primary doctor did send him to see Dr. [name] of interventional cardiology. He underwent an angiogram on 08/06/17 and he underwent a PTA and directional atherectomy of the lesser distal SFA and popliteal arteries which resulted in brisk improvement in blood flow. It was noted that he had 2 vessel runoff through the anterior tibial and peroneal. He is also been to see vascular and interventional radiologist. He was not felt to have any significant superficial venous insufficiency. Presumably is not a candidate for any ablation. It was suggested he come here for wound care. The patient is a type II diabetic on insulin. He also has a history of venous insufficiency. ABIs on the left were noncompressible in our clinic 10/21/17; patient we admitted to the clinic last week. He has a fairly large  chronic ulcer on the left lateral calf in the setting of chronic venous insufficiency. We put Iodosorb on him after an aggressive debridement and 3 layer compression. He complained of pain in his ankle and itching with is skin in  fact he scratched the area on the medial calf superiorly at the rim of our wraps and he has 2 small open areas in that location today which are new. I changed his primary dressing today to silver collagen. As noted he is already had revascularization and does not have any significant superficial venous insufficiency that would be amenable to ablation 10/28/17; patient admitted to the clinic 2 weeks ago. He has a smaller Wound. Scratch injury from last week revealed. There is large wound over the tibial area. This is smaller. Granulation looks healthy. No need for debridement. 11/04/17; the wound on the left lateral calf looks better. Improved dimensions. Surface of this looks better. We've been maintaining him and Kerlix Coban wraps. He finds this much more comfortable. Silver collagen dressing 11/11/17; left lateral Wound continues to look healthy be making progress. Using a #5 curet I removed removed nonviable skin from the surface of the wound and then necrotic debris from the wound surface. Surface of the wound continues to look healthy. ooHe also has an open area on the left great toenail bed. We've been using topical antibiotics. 11/19/17; left anterior lateral wound continues to look healthy but it's not closed. ooHe also had a small wound above this on the left leg ooInitially traumatic wounds in the setting of significant chronic venous insufficiency and stasis dermatitis 11/25/17; left anterior wounds superiorly is closed still a small wound inferiorly. 12/02/17; left anterior tibial area. Arrives today with adherent callus. Post debridement clearly not completely closed. Hydrofera Blue under 3 layer compression. 12/09/17; left anterior tibia. Circumferential eschar  however the wound bed looks stable to improved. We've been using Hydrofera Blue under 3 layer compression 12/17/17; left anterior tibia. Apparently this was felt to be closed however when the wrap was taken off there is a skin tear to reopen wounds in the same area we've been using Hydrofera Blue under 3 layer compression 12/23/17 left anterior tibia. Not close to close this week apparently the [facility] was stuck to this again. Still circumferential eschar requiring debridement. I put a contact layer on this this time under the Hydrofera Blue 12/31/17; left anterior tibia. Wound is better slight amount of hyper-granulation. Using Hydrofera Blue over Adaptic. 01/07/18; left anterior tibia. The wound had some surface eschar however after this was removed he has no open wound.he was already revascularized by Dr. [name] when he came to our clinic with atherectomy of the left SFA and popliteal artery. He was also sent to interventional radiology for venous reflux studies. He was not felt to have significant reflux but certainly has chronic venous changes of his skin with hemosiderin deposition around this area. He will definitely need to lubricate his skin and wear compression stocking and I've talked to him about this. READMISSION 05/26/2018 This is a now 70 year old man we cared for with traumatic wounds on his left anterior lower extremity. He had been previously revascularized during that admission by Dr. [name]. Apparently in follow-up Dr. [name] noted that he had deterioration in his arterial status. He underwent a stent placement in the distal left SFA on 04/22/2018. 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 He went back to the angiography suite on 04/30/2018 he underwent PTA and balloon angioplasty of the occluded left mid anterior tibial artery, thrombotic occlusion went from 100 to 0% which reconstitutes the posterior tibial artery. He had thrombectomy and aspiration of the peroneal  artery. The stent placed in the distal SFA left SFA was still occluded. He was discharged  on Xarelto, it was noted on the discharge summary from this hospitalization that he had gangrene at the tip of his left fifth toe and there were expectations this would auto amputate. 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 He has been recuperating at [facility] after the most recent hospitalization. He is going home tomorrow. He tells me that 2 weeks ago he traumatized the tip of his left fifth toe. He came in urgently for our review of this. This was a history of before I noted that Dr. [name] had already noted dry gangrenous changes of the left fifth toe 06/09/2018; 2-week follow-up. I did contact Dr. [name] after his last appointment and he apparently saw 1 of Dr. Irven Shelling colleagues the next day. He does not follow-up with Dr. [name] himself until Thursday of this week. 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 Nevertheless there is no evidence of infection no drainage and no pain. He had a new area that this week when we were signing him in today on the left anterior mid tibia area, this is in close proximity to the previous wound we have dealt with in this clinic. 06/23/2018; 2-week follow-up. I did not receive a recent note from Dr. [name] to review today. Our office is trying to obtain this. He is apparently not planning to do further vascular interventions and wondered about compression to try and help with the patient's chronic venous insufficiency. However we are also concerned about the arterial flow. ooHe arrives in clinic today with a new area on the left third toe. The areas on the calf/anterior tibia are close to closing. The left fifth toe is still mummified using Betadine. -In reviewing things with the patient he has what sounds like claudication with mild to moderate amount of activity. 06/27/2018;  x-ray of his foot suggested osteomyelitis of the left third toe. I prescribed Levaquin over the phone while we attempted to arrange a plan of care. However the patient called yesterday to report he had low-grade fever and he came in today acutely. There is been a marked deterioration in the left third toe with spreading cellulitis up into the dorsal left foot. He was referred to the emergency room. Readmission: 06/29/2020 patient presents today for reevaluation here in our clinic he was previously treated by Dr. Dellia Nims at the latter part of 2019 in 2 the beginning of 2020. Subsequently we have not seen him since that time in the interim he did have evaluation with vein and vascular specialist specifically Dr. Anice Paganini who did perform quite extensive work for a left femoral to anterior tibial artery bypass. With that being said in the interim the patient has developed significant lymphedema and has wounds that he tells me have really never healed in regard to the incision site on the left leg. He also has multiple wounds on the feet for various reasons some of which is that he tends to pick at his feet. Fortunately there is no signs of active infection systemically at this time he does have some wounds that are little bit deeper but most are fairly superficial he seems to have good blood flow and overall everything appears to be healthy I see no bone exposed and no obvious signs of osteomyelitis. I do not know that he necessarily needs a x-ray at this point although that something we could consider depending on how things  progress. The patient does have a history of lymphedema, diabetes, this is type II, chronic kidney disease stage III, hypertension, and history of peripheral vascular disease. 07/05/2020; patient admitted last week. Is a patient I remember from 2019 he had a spreading infection involving the left foot and we sent him to the hospital. 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 He subsequently had a left femoral to anterior tibial bypass by Dr.Cain vein and vascular. He also has severe lymphedema with chronic skin changes related to that on the left leg. The most problematic area that was new today was on the left medial great toe. This was apparently a small area last week there was purulent drainage which our intake nurse cultured. Also areas on the left medial foot and heel left lateral foot. He has 2 areas on the left medial calf left lateral calf in the setting of the severe lymphedema. 07/13/2020 on evaluation today patient appears to be doing better in my opinion compared to his last visit. The good news is there is no signs of active infection systemically and locally I do not see any signs of infection either. He did have an x-ray which was negative that is great news he had a culture which showed MRSA but at the same time he is been on the doxycycline which has helped. I do think we may want to extend this for 7 additional days 1/25; patient admitted to the clinic a few weeks ago. 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 We have been putting him under compression his edema control is a lot better but he is severe verricused skin on the left leg. He is really done quite well he still has an open area on the left medial calf and the left medial first metatarsal head. We have been using silver collagen on the leg silver alginate on the foot 07/27/2020 upon evaluation today patient appears to be doing decently well in regard to his wounds. He still has a lot of dry skin on the left leg. Some of this is starting to peel back and I think he may be able to have them out by removing some that today. Fortunately there is no signs of active infection at this time on the left leg although on the right leg he does appear to have swelling and erythema as well as some mild warmth to touch. This does have  been concerned about the possibility of cellulitis although within the differential diagnosis I do think that potentially a DVT has to be at least considered. We need to rule that out before proceeding would just call in the cellulitis. Especially since he is having pain in the posterior aspect of his calf muscle. 2/8; the patient had seen sparingly. He has severe skin changes of chronic lymphedema in the left leg thickened hyperkeratotic verrucous skin. He has an open wound on the medial part of the left first met head left mid tibia. He also has a rim of nonepithelialized skin in the anterior mid tibia. He brought in the AmLactin lotion that was been prescribed although I am not sure under compression and its utility. There concern about cellulitis on the right lower leg the last time he was here. He was put on on antibiotics. His DVT rule out was negative. The right leg looks fine he is using his stocking on this area 08/10/2020 upon evaluation today  patient appears to be doing well with regard to his leg currently. He has been tolerating the dressing changes without complication. Fortunately there is no signs of active infection which is great news. Overall very pleased with where things stand. 2/22; the patient still has an area on the medial part of the left first met his head. This looks better than when I last saw this earlier this month he has a rim of epithelialization but still some surface debris. Mostly everything on the left leg is healed. There is still a vulnerable in the left mid tibia area. 08/30/2020 upon evaluation today patient appears to be doing much better in regard to his wounds on his foot. 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 Nonetheless I think we will address that today I Minna send in a prescription for him in that regard. Overall  though there does not appear to be any signs of significant worsening. 09/07/2020 on evaluation today patient's wounds over his left foot appear to be doing excellent. I do not see any signs of infection there is some callus buildup this can require debridement for certain but overall I feel like he is managing quite nicely. He still using the AmLactin cream which has been beneficial for him as well. 3/22; left foot wound is closed. There is no open area here. He is using ammonium lactate lotion to the lower extremities to help exfoliate dry cracked skin. He has compression stockings from elastic therapy in [place]. The wound on the medial part of his left first met head is healed today. READMISSION 04/12/2021 Mr. Bekele is a patient we know fairly well he had a prolonged stay in clinic in 2019 with wounds on his left lateral and left anterior lower extremity in the setting of chronic venous insufficiency. More recently he was here earlier this year with predominantly an area on his left foot first metatarsal head plantar and he says the plantar foot broke down on its not long after we discharged him but he did not come back here. The last few months areas of broken down on his left anterior and again the left lateral lower extremity. The leg itself is very swollen chronically enlarged a lot of hyperkeratotic dry Berry Q skin in the left lower leg. His edema extends well into the thigh. He was seen by Dr. Donzetta Matters. He had ABIs on 03/02/2021 showing an ABI on the right of 1 with a TBI of 0.72 his ABI in the left at 1.09 TBI of 0.99. Monophasic and biphasic waveforms on the right. On the left monophasic waveforms were noted he went on to have an angiogram on 03/27/2021 this showed the aortic aortic and iliac segments were free of flow-limiting stenosis the left common femoral vein to evaluate the left femoral to anterior tibial artery bypass was unobstructed the bypass was patent without any areas of  stenosis. We discharged the patient in bilateral juxta lite stockings but very clearly that was not sufficient to control the swelling and maintain skin integrity. He is clearly going to need compression pumps. 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 This is fortunate because he is on his feet for long periods of time. 10/27; patient comes in with our intake nurse reporting copious amount of green drainage  from the left anterior mid tibia the left dorsal foot and to a lesser extent the left medial mid tibia. We left the compression wrap on all week for the amount of edema in his left leg is quite a bit better. We use silver alginate as the primary dressing 11/3; edema control is good. Left anterior lower leg left medial lower leg and the plantar first metatarsal head. The left anterior lower leg required debridement. Deep tissue culture I did of this wound showed MRSA I put him on 10 days of doxycycline which she will start today. We have him in compression wraps. He has a security card and [employer] however he is retiring on November 15. We will need to then get him into a better offloading boot for the left foot perhaps a total contact cast 11/10; edema control is quite good. Left anterior and left medial lower leg wounds in the setting of chronic venous insufficiency and lymphedema. He also has a substantial area over the left plantar first metatarsal head. 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 He has significant hypergranulation on the left plantar foot wound. The patient is a diabetic but he does not have significant PAD 11/17; edema control is quite good. Left anterior and left medial lower leg wounds look better. The really concerning area remains the area on the left plantar first metatarsal head. He has a rim of epithelialization. He has been  using a surgical shoe The patient is now retired from a a [employer] I have gone over with him the need to offload this area aggressively. Starting today with a forefoot off loader but . possibly a total contact cast. 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 Pulse regular and within target range for patient.Marland Kitchen Respirations regular, non-labored and within target range.. Temperature is normal and within the target range for the patient.Marland Kitchen Appears in no distress. Vitals Time Taken: 9:19 AM, Height: 74 in, Weight: 238 lbs, BMI: 30.6, Temperature: 98 F, Pulse: 64 bpm, Respiratory Rate: 17 breaths/min, Blood Pressure: 156/92 mmHg. Cardiovascular Pedal pulses are palpable. General Notes: Wound exam; left anterior lower leg no debridement is required. The wounds looks healthier in terms of surface. The area that concerns me is the substantial area over the plantar left first metatarsal head. I did a very aggressive debridement on this last week. Rim of epithelialization is encouraging there is no evidence of infection this does not probe to bone Integumentary (Hair, Skin) Wound #18 status is Open. Original cause of wound was Gradually Appeared. The date acquired was: 08/23/2020. The wound has been in treatment 4 weeks. The wound is located on the Left,Plantar Metatarsal head first. The wound measures 3.5cm length x 4cm width x 0.1cm depth; 10.996cm^2 area and 1.1cm^3 volume. There is Fat Layer (Subcutaneous Tissue) exposed. There is no tunneling or undermining noted. There is a large amount of serosanguineous drainage noted. The wound margin is distinct with the outline attached to the wound base. There is large (67-100%) red, pink granulation within the wound bed. There is no necrotic tissue within the wound bed. Wound #19 status is Open. Original cause of wound was Gradually Appeared. The date acquired was: 08/23/2020. The  wound has been in treatment 4 weeks. The wound is located on the Left,Anterior Lower Leg. The wound measures 4.5cm length x 4cm width  x 0.3cm depth; 14.137cm^2 area and 4.241cm^3 volume. There is Fat Layer (Subcutaneous Tissue) exposed. There is no tunneling or undermining noted. There is a large amount of purulent drainage noted. The wound margin is distinct with the outline attached to the wound base. There is large (67-100%) red, pink granulation within the wound bed. There is a small (1-33%) amount of necrotic tissue within the wound bed including Adherent Slough. Wound #21 status is Open. Original cause of wound was Gradually Appeared. The date acquired was: 08/23/2020. The wound has been in treatment 4 weeks. The wound is located on the Left,Medial Lower Leg. The wound measures 1.6cm length x 1cm width x 0.4cm depth; 1.257cm^2 area and 0.503cm^3 volume. There is no tunneling or undermining noted. There is a medium amount of purulent drainage noted. The wound margin is distinct with the outline attached to the wound base. There is large (67-100%) red, pink granulation within the wound bed. There is a small (1-33%) amount of necrotic tissue within the wound bed including Adherent Slough. Assessment Active Problems ICD-10 Type 2 diabetes mellitus with foot ulcer Type 2 diabetes mellitus with diabetic peripheral angiopathy without gangrene Lymphedema, not elsewhere classified 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 Non-pressure chronic ulcer of other part of left lower leg with other specified severity Non-pressure chronic ulcer of other part of left foot with other specified severity Type 2 diabetes mellitus with diabetic polyneuropathy Plan Follow-up Appointments: Return Appointment in 2 weeks. - Dr. Dellia Nims Nurse Visit: - 11/21 and 11/28 Bathing/ Shower/ Hygiene: May shower with protection but do not get wound dressing(s) wet. - Use a cast  protector so you can shower without getting your wrap(s) wet Edema Control - Lymphedema / SCD / Other: Elevate legs to the level of the heart or above for 30 minutes daily and/or when sitting, a frequency of: Avoid standing for long periods of time. Patient to wear own compression stockings every day. - on right leg; Moisturize legs daily. - Ammonium LACTATE to BLE every day. Off-Loading: Wedge shoe to: - left front foot offloader WOUND #18: - Metatarsal head first Wound Laterality: Plantar, Left Cleanser: Normal Saline 2 x Per Week/7 Days Discharge Instructions: Cleanse the wound with Normal Saline prior to applying a clean dressing using gauze sponges, not tissue or cotton balls. Cleanser: Soap and Water 2 x Per Week/7 Days Discharge Instructions: May shower and wash wound with dial antibacterial soap and water prior to dressing change. Cleanser: Wound Cleanser 2 x Per Week/7 Days Discharge Instructions: Cleanse the wound with wound cleanser prior to applying a clean dressing using gauze sponges, not tissue or cotton balls. Peri-Wound Care: Triamcinolone 15 (g) 2 x Per Week/7 Days Discharge Instructions: Use triamcinolone 15 (g) as directed Peri-Wound Care: Zinc Oxide Ointment 30g tube 2 x Per Week/7 Days Discharge Instructions: Apply Zinc Oxide to periwound with each dressing change Peri-Wound Care: Sween Lotion (Moisturizing lotion) 2 x Per Week/7 Days Discharge Instructions: Apply moisturizing lotion as directed Prim Dressing: Hydrofera Blue Classic Foam, 4x4 in 2 x Per Week/7 Days ary Discharge Instructions: Moisten with saline prior to applying to wound bed Secondary Dressing: ABD Pad, 5x9 2 x Per Week/7 Days Discharge Instructions: Apply over primary dressing as directed. Secondary Dressing: Zetuvit Plus 4x8 in 2 x Per Week/7 Days Discharge Instructions: Apply over primary dressing as directed. Secondary Dressing: CarboFLEX Odor Control Dressing, 4x4 in 2 x Per Week/7  Days Discharge Instructions: Apply over primary dressing as directed. Com pression Wrap:  Unnaboot w/Calamine, 4x10 (in/yd) 2 x Per Week/7 Days Discharge Instructions: Apply Unnaboot as directed. May also use Miliken CoFlex Calamine 2 layer compression system as alternative. WOUND #19: - Lower Leg Wound Laterality: Left, Anterior Cleanser: Normal Saline 2 x Per Week/7 Days Discharge Instructions: Cleanse the wound with Normal Saline prior to applying a clean dressing using gauze sponges, not tissue or cotton balls. Cleanser: Soap and Water 2 x Per Week/7 Days Discharge Instructions: May shower and wash wound with dial antibacterial soap and water prior to dressing change. Cleanser: Wound Cleanser 2 x Per Week/7 Days Discharge Instructions: Cleanse the wound with wound cleanser prior to applying a clean dressing using gauze sponges, not tissue or cotton balls. Peri-Wound Care: Triamcinolone 15 (g) 2 x Per Week/7 Days Discharge Instructions: Use triamcinolone 15 (g) as directed Peri-Wound Care: Zinc Oxide Ointment 30g tube 2 x Per Week/7 Days Discharge Instructions: Apply Zinc Oxide to periwound with each dressing change Peri-Wound Care: Sween Lotion (Moisturizing lotion) 2 x Per Week/7 Days Discharge Instructions: Apply moisturizing lotion as directed Prim Dressing: Hydrofera Blue Classic Foam, 4x4 in 2 x Per Week/7 Days ary Discharge Instructions: Moisten with saline prior to applying to wound bed Secondary Dressing: ABD Pad, 5x9 2 x Per Week/7 Days Discharge Instructions: Apply over primary dressing as directed. Secondary Dressing: Zetuvit Plus 4x8 in 2 x Per Week/7 Days Discharge Instructions: Apply over primary dressing as directed. Secondary Dressing: CarboFLEX Odor Control Dressing, 4x4 in 2 x Per Week/7 Days Discharge Instructions: Apply over primary dressing as directed. Com pression Wrap: Unnaboot w/Calamine, 4x10 (in/yd) 2 x Per Week/7 Days Discharge Instructions: Apply Unnaboot as  directed. May also use Miliken CoFlex Calamine 2 layer compression system as alternative. WOUND #21: - Lower Leg Wound Laterality: Left, Medial Cleanser: Normal Saline 2 x Per Week/7 Days Discharge Instructions: Cleanse the wound with Normal Saline prior to applying a clean dressing using gauze sponges, not tissue or cotton balls. Cleanser: Soap and Water 2 x Per Week/7 Days Discharge Instructions: May shower and wash wound with dial antibacterial soap and water prior to dressing change. Cleanser: Wound Cleanser 2 x Per Week/7 Days Discharge Instructions: Cleanse the wound with wound cleanser prior to applying a clean dressing using gauze sponges, not tissue or cotton balls. Peri-Wound Care: Triamcinolone 15 (g) 2 x Per Week/7 Days Discharge Instructions: Use triamcinolone 15 (g) as directed Peri-Wound Care: Zinc Oxide Ointment 30g tube 2 x Per Week/7 Days Discharge Instructions: Apply Zinc Oxide to periwound with each dressing change Peri-Wound Care: Sween Lotion (Moisturizing lotion) 2 x Per Week/7 Days Discharge Instructions: Apply moisturizing lotion as directed Prim Dressing: Hydrofera Blue Classic Foam, 4x4 in 2 x Per Week/7 Days ary Discharge Instructions: Moisten with saline prior to applying to wound bed Secondary Dressing: ABD Pad, 5x9 2 x Per Week/7 Days Discharge Instructions: Apply over primary dressing as directed. Secondary Dressing: Zetuvit Plus 4x8 in 2 x Per Week/7 Days Discharge Instructions: Apply over primary dressing as directed. Secondary Dressing: CarboFLEX Odor Control Dressing, 4x4 in 2 x Per Week/7 Days Discharge Instructions: Apply over primary dressing as directed. Com pression Wrap: Unnaboot w/Calamine, 4x10 (in/yd) 2 x Per Week/7 Days Discharge Instructions: Apply Unnaboot as directed. May also use Miliken CoFlex Calamine 2 layer compression system as alternative. #1 continue with Hydrofera Blue to all wounds. 2. 4-layer equivalent compression 3. I will see  him back in 2 weeks likely to look at [facility], consideration of a total contact cast I talked to him about  this today 4. He expressed concern about the forefoot offloading boot vis--vis his balance and apparently in the past he had hip pain. I told him that I expect this to be a minimal ambulation time. 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 05/11/2021 Medical Record Number: [id number] Patient Account Number: [id number] Date of Birth/Sex: Treating RN: 02-06-1951 (70 y.o. 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 of other part of left foot with other specified severity E11.42 Type 2 diabetes mellitus with diabetic polyneuropathy Physician Procedures : CPT4 Code Description Modifier [id number] - WC PHYS LEVEL 3 - EST PT ICD-10 Diagnosis Description 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verity 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 of other part of  left foot with other specified severity E11.621 Type 2 diabetes mellitus with foot ulcer Quantity: 1 Electronic Signature(s) Signed:  05/11/2021 5:16:34 PM By: Linton Ham MD Entered By: Linton Ham on 05/11/2021 10:10:15

## 2021-05-11 NOTE — Progress Notes (Signed)
[name], ROMAS ([id number]) Visit Report for 05/11/2021 Arrival Information Details Patient Name: Date of Service: OREL, COOLER 05/11/2021 9:00 A M Medical Record Number: [id number] Patient Account Number: [id number] Date of Birth/Sex: Treating RN: 25-Nov-1950 (70 y.o. [name], [name] Primary Care Tamas Suen: [name] Other Clinician: Referring Jamell Opfer: Treating Leocadio Heal/Extender: Stormy Card in Treatment: 4 Visit Information History Since Last Visit Added or deleted any medications: No Patient Arrived: Ambulatory Any new allergies or adverse reactions: No Arrival Time: 09:11 Had a fall or experienced change in No Accompanied By: self activities of daily living that may affect Transfer Assistance: Manual risk of falls: Patient Identification Verified: Yes Signs or symptoms of abuse/neglect since last visito No Secondary Verification Process Completed: Yes Hospitalized since last visit: No Patient Requires Transmission-Based Precautions: No Implantable device outside of the clinic excluding No Patient Has Alerts: Yes cellular tissue based products placed in the center Patient Alerts: ABI's: 09/22 L:1.09 R: 1. since last visit: TBI's: R:0.72 L:0.99 Has Dressing in Place as Prescribed: Yes Pain Present Now: No Electronic Signature(s) Signed: 05/11/2021 5:03:29 PM By: Rhae Hammock RN Entered By: Rhae Hammock on 05/11/2021 09:12:15 -------------------------------------------------------------------------------- Encounter Discharge Information Details Patient Name: Date of Service: [name]. 05/11/2021 9:00 A M Medical Record Number: [id number] Patient Account Number: [id number] Date of Birth/Sex: Treating RN: Jan 14, 1951 (70 y.o. [name], [name] Primary Care Taurean Ju: [name] Other Clinician: Referring Raphaella Larkin: Treating Ryder Man/Extender: Stormy Card in Treatment: 4 Encounter Discharge  Information Items Discharge Condition: Stable Ambulatory Status: Ambulatory Discharge Destination: Home Transportation: Private Auto Accompanied By: self Schedule Follow-up Appointment: Yes Clinical Summary of Care: Patient Declined Electronic Signature(s) Signed: 05/11/2021 5:03:29 PM By: Rhae Hammock RN Entered By: Rhae Hammock on 05/11/2021 10:36:06 -------------------------------------------------------------------------------- Lower Extremity Assessment Details Patient Name: Date of Service: [name]. 05/11/2021 9:00 A M Medical Record Number: [id number] Patient Account Number: [id number] Date of Birth/Sex: Treating RN: 1951-06-23 (70 y.o. [name], [name] Primary Care Ha [name]: [name] Other Clinician: Referring Deo Mehringer: Treating Adonte Vanriper/Extender: Stormy Card in Treatment: 4 Edema Assessment Assessed: [name]: Yes] [name]: No] Edema: [Left: Ye] [Right: s] Calf Left: Right: Point of Measurement: 41 cm From Medial Instep 46 cm Ankle Left: Right: Point of Measurement: 10 cm From Medial Instep 28 cm Vascular Assessment Pulses: Dorsalis Pedis Palpable: [Left:Yes] Posterior Tibial Palpable: [Left:Yes] Electronic Signature(s) Signed: 05/11/2021 5:03:29 PM By: Rhae Hammock RN Entered By: Rhae Hammock on 05/11/2021 09:12:32 -------------------------------------------------------------------------------- Multi Wound Chart Details Patient Name: Date of Service: [name]. 05/11/2021 9:00 A M Medical Record Number: [id number] Patient Account Number: [id number] Date of Birth/Sex: Treating RN: February 01, 1951 (70 y.o. [name], [name] Primary Care Renny Remer: [name] Other Clinician: Referring Eyana Stolze: Treating Kimoni Pagliarulo/Extender: Stormy Card in Treatment: 4 Vital Signs Height(in): 40 Pulse(bpm): 31 Weight(lbs): 42 Blood Pressure(mmHg): 156/92 Body Mass Index(BMI):  31 Temperature(F): 98 Respiratory Rate(breaths/min): 7 Photos: [18:Left, Plantar Metatarsal head first] [19:Left, Anterior Lower Leg] [21:Left, Medial Lower Leg] Wound Location: [18:Gradually Appeared] [19:Gradually Appeared] [21:Gradually Appeared] Wounding Event: [18:Diabetic Wound/Ulcer of the Lower] [19:Diabetic Wound/Ulcer of the Lower] [21:Diabetic Wound/Ulcer of the Lower] Primary Etiology: [18:Extremity Glaucoma, Sleep Apnea,] [19:Extremity Glaucoma, Sleep Apnea,] [21:Extremity Glaucoma, Sleep Apnea,] Comorbid History: [18:Hypertension, Peripheral Arterial Disease, Peripheral Venous Disease, Disease, Peripheral Venous Disease, Disease, Peripheral Venous Disease, Type II Diabetes, Gout, Osteoarthritis, Type II Diabetes, Gout, Osteoarthritis, Type II  Diabetes, Gout, Osteoarthritis, Neuropathy 08/23/2020] [19:Hypertension, Peripheral Arterial Neuropathy 08/23/2020] [21:Hypertension, Peripheral Arterial Neuropathy 08/23/2020]  Date Acquired: [18:4] [19:4] [21:4] Weeks of Treatment: [18:Open] [19:Open] [21:Open] Wound Status: [18:3.5x4x0.1] [19:4.5x4x0.3] [21:1.6x1x0.4] Measurements L x W x D (cm) [18:10.996] [19:14.137] [21:1.257] A (cm) : rea [18:1.1] [19:4.241] [25:8.527] Volume (cm) : [18:22.20%] [19:2.20%] [21:57.30%] % Reduction in A rea: [18:61.10%] [19:2.20%] [21:43.10%] % Reduction in Volume: [18:Grade 2] [19:Grade 2] [21:Grade 2] Classification: [18:Large] [19:Large] [21:Medium] Exudate A mount: [18:Serosanguineous] [19:Purulent] [21:Purulent] Exudate Type: [18:red, brown] [19:yellow, brown, green] [21:yellow, brown, green] Exudate Color: [18:Distinct, outline attached] [19:Distinct, outline attached] [21:Distinct, outline attached] Wound Margin: [18:Large (67-100%)] [19:Large (67-100%)] [21:Large (67-100%)] Granulation A mount: [18:Red, Pink] [19:Red, Pink] [21:Red, Pink] Granulation Quality: [18:None Present (0%)] [19:Small (1-33%)] [21:Small (1-33%)] Necrotic A mount: [18:Fat  Layer (Subcutaneous Tissue): Yes Fat Layer (Subcutaneous Tissue): Yes Fascia: No] Exposed Structures: [18:Fascia: No Tendon: No Muscle: No Joint: No Bone: No Small (1-33%)] [19:Fascia: No Tendon: No Muscle: No Joint: No Bone: No Small (1-33%)] [21:Fat Layer (Subcutaneous Tissue): No Tendon: No Muscle: No Joint: No Bone: No Small (1-33%)] Treatment Notes Electronic Signature(s) Signed: 05/11/2021 5:16:34 PM By: Linton Ham MD Signed: 05/11/2021 5:18:51 PM By: Deon Pilling RN, BSN Entered By: Linton Ham on 05/11/2021 10:05:05 -------------------------------------------------------------------------------- Multi-Disciplinary Care Plan Details Patient Name: Date of Service: [name]. 05/11/2021 9:00 A M Medical Record Number: [id number] Patient Account Number: [id number] Date of Birth/Sex: Treating RN: 1951/03/16 (70 y.o. [name], [name] Primary Care Archimedes Harold: [name] Other Clinician: Referring Raley Novicki: Treating Sharlize Hoar/Extender: Stormy Card in Treatment: 4 Multidisciplinary Care Plan reviewed with physician Active Inactive Soft Tissue Infection Nursing Diagnoses: Impaired tissue integrity Potential for infection: soft tissue Goals: Patient's soft tissue infection will resolve Date Initiated: 04/20/2021 Target Resolution Date: 06/09/2021 Goal Status: Active Interventions: Assess signs and symptoms of infection every visit Provide education on infection Treatment Activities: Culture : 04/20/2021 Education provided on Infection : 05/08/2021 Systemic antibiotics : 04/20/2021 T ordered outside of clinic : 04/20/2021 est Notes: Wound/Skin Impairment Nursing Diagnoses: Impaired tissue integrity Knowledge deficit related to ulceration/compromised skin integrity Goals: Patient will have a decrease in wound volume by X% from date: (specify in notes) Date Initiated: 04/12/2021 Date Inactivated: 04/27/2021 Target Resolution Date:  04/23/2021 Goal Status: Met Patient/caregiver will verbalize understanding of skin care regimen Date Initiated: 04/12/2021 Target Resolution Date: 05/25/2021 Goal Status: Active Ulcer/skin breakdown will have a volume reduction of 30% by week 4 Date Initiated: 04/12/2021 Date Inactivated: 04/27/2021 Target Resolution Date: 04/27/2021 Goal Status: Unmet Unmet Reason: infection Ulcer/skin breakdown will have a volume reduction of 50% by week 8 Date Initiated: 04/27/2021 Target Resolution Date: 05/25/2021 Goal Status: Active Interventions: Assess patient/caregiver ability to obtain necessary supplies Assess patient/caregiver ability to perform ulcer/skin care regimen upon admission and as needed Assess ulceration(s) every visit Notes: Electronic Signature(s) Signed: 05/11/2021 5:03:29 PM By: Rhae Hammock RN Entered By: Rhae Hammock on 05/11/2021 09:52:57 -------------------------------------------------------------------------------- Pain Assessment Details Patient Name: Date of Service: [name]. 05/11/2021 9:00 A M Medical Record Number: [id number] Patient Account Number: [id number] Date of Birth/Sex: Treating RN: June 07, 1951 (70 y.o. [name], [name] Primary Care Dvante Hands: [name] Other Clinician: Referring Shyla Gayheart: Treating Gadge Hermiz/Extender: Stormy Card in Treatment: 4 Active Problems Location of Pain Severity and Description of Pain Patient Has Paino No Site Locations Pain Management and Medication Current Pain Management: Electronic Signature(s) Signed: 05/11/2021 5:03:29 PM By: Rhae Hammock RN Entered By: Rhae Hammock on 05/11/2021 09:12:23 -------------------------------------------------------------------------------- Patient/Caregiver Education Details Patient Name: Date of Service: [name], [name] 11/17/2022andnbsp9:00 [place] Record Number: [id number]  Patient Account Number: [id number] Date of  Birth/Gender: Treating RN: [birth year] (70 y.o. [name] Primary Care Physician: [name] Other Clinician: Referring Physician: Treating Physician/Extender: Stormy Card in Treatment: 4 Education Assessment Education Provided To: Patient Education Topics Provided Infection: Methods: Explain/Verbal Responses: Reinforcements needed, State content correctly Electronic Signature(s) Signed: 05/11/2021 5:03:29 PM By: Rhae Hammock RN Entered By: Rhae Hammock on 05/11/2021 09:53:17 -------------------------------------------------------------------------------- Wound Assessment Details Patient Name: Date of Service: [name]. 05/11/2021 9:00 A M Medical Record Number: [id number] Patient Account Number: [id number] Date of Birth/Sex: Treating RN: Sep 23, 1950 (70 y.o. [name], Nesbitt Primary Care Shanieka Blea: [name] Other Clinician: Referring Leticia Coletta: Treating Abygayle Deltoro/Extender: Stormy Card in Treatment: 4 Wound Status Wound Number: 18 Primary Diabetic Wound/Ulcer of the Lower Extremity Etiology: Wound Location: Left, Plantar Metatarsal head first Wound Open Wounding Event: Gradually Appeared Status: Date Acquired: 08/23/2020 Comorbid Glaucoma, Sleep Apnea, Hypertension, Peripheral Arterial Disease, Weeks Of Treatment: 4 History: Peripheral Venous Disease, Type II Diabetes, Gout, Osteoarthritis, Clustered Wound: No Neuropathy Photos Wound Measurements Length: (cm) 3.5 Width: (cm) 4 Depth: (cm) 0.1 Area: (cm) 10.996 Volume: (cm) 1.1 % Reduction in Area: 22.2% % Reduction in Volume: 61.1% Epithelialization: Small (1-33%) Tunneling: No Undermining: No Wound Description Classification: Grade 2 Wound Margin: Distinct, outline attached Exudate Amount: Large Exudate Type: Serosanguineous Exudate Color: red, brown Wound Bed Granulation Amount: Large (67-100%) Exposed Structure Granulation  Quality: Red, Pink Fascia Exposed: No Necrotic Amount: None Present (0%) Fat Layer (Subcutaneous Tissue) Exposed: Yes Tendon Exposed: No Muscle Exposed: No Joint Exposed: No Bone Exposed: No Treatment Notes Wound #18 (Metatarsal head first) Wound Laterality: Plantar, Left Cleanser Normal Saline Discharge Instruction: Cleanse the wound with Normal Saline prior to applying a clean dressing using gauze sponges, not tissue or cotton balls. Soap and Water Discharge Instruction: May shower and wash wound with dial antibacterial soap and water prior to dressing change. Wound Cleanser Discharge Instruction: Cleanse the wound with wound cleanser prior to applying a clean dressing using gauze sponges, not tissue or cotton balls. Peri-Wound Care Triamcinolone 15 (g) Discharge Instruction: Use triamcinolone 15 (g) as directed Zinc Oxide Ointment 30g tube Discharge Instruction: Apply Zinc Oxide to periwound with each dressing change Sween Lotion (Moisturizing lotion) Discharge Instruction: Apply moisturizing lotion as directed Topical Primary Dressing Hydrofera Blue Classic Foam, 4x4 in Discharge Instruction: Moisten with saline prior to applying to wound bed Secondary Dressing ABD Pad, 5x9 Discharge Instruction: Apply over primary dressing as directed. Zetuvit Plus 4x8 in Discharge Instruction: Apply over primary dressing as directed. CarboFLEX Odor Control Dressing, 4x4 in Discharge Instruction: Apply over primary dressing as directed. Secured With Compression Wrap Unnaboot w/Calamine, 4x10 (in/yd) Discharge Instruction: Apply Unnaboot as directed. May also use Miliken CoFlex Calamine 2 layer compression system as alternative. Compression Stockings Add-Ons Electronic Signature(s) Signed: 05/11/2021 5:03:29 PM By: Rhae Hammock RN Entered By: Rhae Hammock on 05/11/2021 09:34:06 -------------------------------------------------------------------------------- Wound Assessment  Details Patient Name: Date of Service: [name]. 05/11/2021 9:00 A M Medical Record Number: [id number] Patient Account Number: [id number] Date of Birth/Sex: Treating RN: Feb 10, 1951 (70 y.o. [name], [name] Primary Care Emireth Cockerham: [name] Other Clinician: Referring Quintavis Brands: Treating Kennadie Brenner/Extender: Stormy Card in Treatment: 4 Wound Status Wound Number: 19 Primary Diabetic Wound/Ulcer of the Lower Extremity Etiology: Wound Location: Left, Anterior Lower Leg Wound Open Wounding Event: Gradually Appeared Status: Date Acquired: 08/23/2020 Comorbid Glaucoma, Sleep Apnea, Hypertension, Peripheral Arterial Disease, Weeks Of Treatment: 4 History: Peripheral Venous Disease,  Type II Diabetes, Gout, Osteoarthritis, Clustered Wound: No Neuropathy Photos Wound Measurements Length: (cm) 4.5 Width: (cm) 4 Depth: (cm) 0.3 Area: (cm) 14.137 Volume: (cm) 4.241 % Reduction in Area: 2.2% % Reduction in Volume: 2.2% Epithelialization: Small (1-33%) Tunneling: No Undermining: No Wound Description Classification: Grade 2 Wound Margin: Distinct, outline attached Exudate Amount: Large Exudate Type: Purulent Exudate Color: yellow, brown, green Foul Odor After Cleansing: No Slough/Fibrino Yes Wound Bed Granulation Amount: Large (67-100%) Exposed Structure Granulation Quality: Red, Pink Fascia Exposed: No Necrotic Amount: Small (1-33%) Fat Layer (Subcutaneous Tissue) Exposed: Yes Necrotic Quality: Adherent Slough Tendon Exposed: No Muscle Exposed: No Joint Exposed: No Bone Exposed: No Treatment Notes Wound #19 (Lower Leg) Wound Laterality: Left, Anterior Cleanser Normal Saline Discharge Instruction: Cleanse the wound with Normal Saline prior to applying a clean dressing using gauze sponges, not tissue or cotton balls. Soap and Water Discharge Instruction: May shower and wash wound with dial antibacterial soap and water prior to dressing  change. Wound Cleanser Discharge Instruction: Cleanse the wound with wound cleanser prior to applying a clean dressing using gauze sponges, not tissue or cotton balls. Peri-Wound Care Triamcinolone 15 (g) Discharge Instruction: Use triamcinolone 15 (g) as directed Zinc Oxide Ointment 30g tube Discharge Instruction: Apply Zinc Oxide to periwound with each dressing change Sween Lotion (Moisturizing lotion) Discharge Instruction: Apply moisturizing lotion as directed Topical Primary Dressing Hydrofera Blue Classic Foam, 4x4 in Discharge Instruction: Moisten with saline prior to applying to wound bed Secondary Dressing ABD Pad, 5x9 Discharge Instruction: Apply over primary dressing as directed. Zetuvit Plus 4x8 in Discharge Instruction: Apply over primary dressing as directed. CarboFLEX Odor Control Dressing, 4x4 in Discharge Instruction: Apply over primary dressing as directed. Secured With Compression Wrap Unnaboot w/Calamine, 4x10 (in/yd) Discharge Instruction: Apply Unnaboot as directed. May also use Miliken CoFlex Calamine 2 layer compression system as alternative. Compression Stockings Add-Ons Electronic Signature(s) Signed: 05/11/2021 5:03:29 PM By: Rhae Hammock RN Entered By: Rhae Hammock on 05/11/2021 09:34:38 -------------------------------------------------------------------------------- Wound Assessment Details Patient Name: Date of Service: [name]. 05/11/2021 9:00 A M Medical Record Number: [id number] Patient Account Number: [id number] Date of Birth/Sex: Treating RN: Oct 27, 1950 (70 y.o. [name], [name] Primary Care [name]: [name] Other Clinician: Referring Shreena Baines: Treating Jemma Rasp/Extender: Stormy Card in Treatment: 4 Wound Status Wound Number: 21 Primary Diabetic Wound/Ulcer of the Lower Extremity Etiology: Wound Location: Left, Medial Lower Leg Wound Open Wounding Event: Gradually  Appeared Status: Date Acquired: 08/23/2020 Comorbid Glaucoma, Sleep Apnea, Hypertension, Peripheral Arterial Disease, Weeks Of Treatment: 4 History: Peripheral Venous Disease, Type II Diabetes, Gout, Osteoarthritis, Clustered Wound: No Neuropathy Photos Wound Measurements Length: (cm) 1.6 Width: (cm) 1 Depth: (cm) 0.4 Area: (cm) 1.257 Volume: (cm) 0.503 % Reduction in Area: 57.3% % Reduction in Volume: 43.1% Epithelialization: Small (1-33%) Tunneling: No Undermining: No Wound Description Classification: Grade 2 Wound Margin: Distinct, outline attached Exudate Amount: Medium Exudate Type: Purulent Exudate Color: yellow, brown, green Wound Bed Granulation Amount: Large (67-100%) Exposed Structure Granulation Quality: Red, Pink Fascia Exposed: No Necrotic Amount: Small (1-33%) Fat Layer (Subcutaneous Tissue) Exposed: No Necrotic Quality: Adherent Slough Tendon Exposed: No Muscle Exposed: No Joint Exposed: No Bone Exposed: No Treatment Notes Wound #21 (Lower Leg) Wound Laterality: Left, Medial Cleanser Normal Saline Discharge Instruction: Cleanse the wound with Normal Saline prior to applying a clean dressing using gauze sponges, not tissue or cotton balls. Soap and Water Discharge Instruction: May shower and wash wound with dial antibacterial soap and water prior to dressing change.  Wound Cleanser Discharge Instruction: Cleanse the wound with wound cleanser prior to applying a clean dressing using gauze sponges, not tissue or cotton balls. Peri-Wound Care Triamcinolone 15 (g) Discharge Instruction: Use triamcinolone 15 (g) as directed Zinc Oxide Ointment 30g tube Discharge Instruction: Apply Zinc Oxide to periwound with each dressing change Sween Lotion (Moisturizing lotion) Discharge Instruction: Apply moisturizing lotion as directed Topical Primary Dressing Hydrofera Blue Classic Foam, 4x4 in Discharge Instruction: Moisten with saline prior to applying to wound  bed Secondary Dressing ABD Pad, 5x9 Discharge Instruction: Apply over primary dressing as directed. Zetuvit Plus 4x8 in Discharge Instruction: Apply over primary dressing as directed. CarboFLEX Odor Control Dressing, 4x4 in Discharge Instruction: Apply over primary dressing as directed. Secured With Compression Wrap Unnaboot w/Calamine, 4x10 (in/yd) Discharge Instruction: Apply Unnaboot as directed. May also use Miliken CoFlex Calamine 2 layer compression system as alternative. Compression Stockings Add-Ons Electronic Signature(s) Signed: 05/11/2021 5:03:29 PM By: Rhae Hammock RN Entered By: Rhae Hammock on 05/11/2021 09:35:39 -------------------------------------------------------------------------------- Vitals Details Patient Name: Date of Service: [name]. 05/11/2021 9:00 A M Medical Record Number: [id number] Patient Account Number: [id number] Date of Birth/Sex: Treating RN: 30-Dec-1950 (70 y.o. [name], [name] Primary Care Kadir Azucena: [name] Other Clinician: Referring Kit Mollett: Treating Baron Parmelee/Extender: Stormy Card in Treatment: 4 Vital Signs Time Taken: 09:19 Temperature (F): 98 Height (in): 74 Pulse (bpm): 64 Weight (lbs): 238 Respiratory Rate (breaths/min): 17 Body Mass Index (BMI): 30.6 Blood Pressure (mmHg): 156/92 Reference Range: 80 - 120 mg / dl Electronic Signature(s) Signed: 05/11/2021 5:03:29 PM By: Rhae Hammock RN Entered By: Rhae Hammock on 05/11/2021 09:19:30

## 2021-05-15 ENCOUNTER — Other Ambulatory Visit: Payer: Self-pay

## 2021-05-15 ENCOUNTER — Encounter (HOSPITAL_BASED_OUTPATIENT_CLINIC_OR_DEPARTMENT_OTHER): Payer: BC Managed Care – PPO | Admitting: Internal Medicine

## 2021-05-15 DIAGNOSIS — E11621 Type 2 diabetes mellitus with foot ulcer: Secondary | ICD-10-CM | POA: Diagnosis not present

## 2021-05-15 NOTE — Progress Notes (Signed)
LOGIN, MUCKLEROY ([id number]) Visit Report for 05/15/2021 Arrival Information Details Patient Name: Date of Service: [name], [name] 05/15/2021 4:00 PM Medical Record Number: [id number] Patient Account Number: [id number] Date of Birth/Sex: Treating RN: [birth year] (70 y.o. [name] Primary Care Gagandeep Pettet: [name] Other Clinician: Referring Nekhi Liwanag: Treating Karlie Aung/Extender: Cornelious Bryant in Treatment: 4 Visit Information History Since Last Visit Added or deleted any medications: No Patient Arrived: Ambulatory Any new allergies or adverse reactions: No Arrival Time: 15:55 Had a fall or experienced change in No Accompanied By: self activities of daily living that may affect Transfer Assistance: None risk of falls: Patient Identification Verified: Yes Signs or symptoms of abuse/neglect since last visito No Secondary Verification Process Completed: Yes Hospitalized since last visit: No Patient Requires Transmission-Based Precautions: No Implantable device outside of the clinic excluding No Patient Has Alerts: Yes cellular tissue based products placed in the center Patient Alerts: ABI's: 09/22 L:1.09 R: 1. since last visit: TBI's: R:0.72 L:0.99 Has Dressing in Place as Prescribed: Yes Has Compression in Place as Prescribed: Yes Pain Present Now: No Electronic Signature(s) Signed: 05/15/2021 4:58:35 PM By: Deon Pilling RN, BSN Entered By: Deon Pilling on 05/15/2021 16:20:19 -------------------------------------------------------------------------------- Compression Therapy Details Patient Name: Date of Service: [name]. 05/15/2021 4:00 PM Medical Record Number: [id number] Patient Account Number: [id number] Date of Birth/Sex: Treating RN: [birth year] (70 y.o. [name] Primary Care Patton Rabinovich: [name] Other Clinician: Referring Iyania Denne: Treating Adalyna Godbee/Extender: Cornelious Bryant in Treatment:  4 Compression Therapy Performed for Wound Assessment: Wound #18 Left,Plantar Metatarsal head first Performed By: Clinician Deon Pilling, RN Compression Type: Rolena Infante Electronic Signature(s) Signed: 05/15/2021 4:58:35 PM By: Deon Pilling RN, BSN Entered By: Deon Pilling on 05/15/2021 16:21:27 -------------------------------------------------------------------------------- Compression Therapy Details Patient Name: Date of Service: [name]. 05/15/2021 4:00 PM Medical Record Number: [id number] Patient Account Number: [id number] Date of Birth/Sex: Treating RN: 1951/06/17 (70 y.o. [name] Primary Care Warner Laduca: [name] Other Clinician: Referring Milagro Belmares: Treating Mersadie Kavanaugh/Extender: Cornelious Bryant in Treatment: 4 Compression Therapy Performed for Wound Assessment: Wound #19 Left,Anterior Lower Leg Performed By: Clinician Deon Pilling, RN Compression Type: Rolena Infante Electronic Signature(s) Signed: 05/15/2021 4:58:35 PM By: Deon Pilling RN, BSN Entered By: Deon Pilling on 05/15/2021 16:21:27 -------------------------------------------------------------------------------- Compression Therapy Details Patient Name: Date of Service: [name]. 05/15/2021 4:00 PM Medical Record Number: [id number] Patient Account Number: [id number] Date of Birth/Sex: Treating RN: 1951/06/24 (70 y.o. [name] Primary Care Cyris Maalouf: [name] Other Clinician: Referring Bret Vanessen: Treating Emoni Yang/Extender: Cornelious Bryant in Treatment: 4 Compression Therapy Performed for Wound Assessment: Wound #21 Left,Medial Lower Leg Performed By: Clinician Deon Pilling, RN Compression Type: Rolena Infante Electronic Signature(s) Signed: 05/15/2021 4:58:35 PM By: Deon Pilling RN, BSN Entered By: Deon Pilling on 05/15/2021 16:21:27 -------------------------------------------------------------------------------- Encounter Discharge  Information Details Patient Name: Date of Service: [name]. 05/15/2021 4:00 PM Medical Record Number: [id number] Patient Account Number: [id number] Date of Birth/Sex: Treating RN: 08-23-1950 (70 y.o. [name] Primary Care Phyllicia Dudek: [name] Other Clinician: Referring Grethel Zenk: Treating Joanthan Hlavacek/Extender: Cornelious Bryant in Treatment: 4 Encounter Discharge Information Items Discharge Condition: Stable Ambulatory Status: Ambulatory Discharge Destination: Home Transportation: Private Auto Accompanied By: self Schedule Follow-up Appointment: Yes Clinical Summary of Care: Electronic Signature(s) Signed: 05/15/2021 4:58:35 PM By: Deon Pilling RN, BSN Entered By: Deon Pilling on 05/15/2021 16:24:23 -------------------------------------------------------------------------------- Patient/Caregiver Education Details Patient Name: Date of Service: [name], [name]  Dianna [name] 11/21/2022andnbsp4:00 PM Medical Record Number: [id number] Patient Account Number: [id number] Date of Birth/Gender: Treating RN: 1950-08-19 (70 y.o. [name] Primary Care Physician: [name] Other Clinician: Referring Physician: Treating Physician/Extender: Cornelious Bryant in Treatment: 4 Education Assessment Education Provided To: Patient Education Topics Provided Wound/Skin Impairment: Handouts: Skin Care Do's and Dont's Methods: Explain/Verbal Responses: Reinforcements needed Electronic Signature(s) Signed: 05/15/2021 4:58:35 PM By: Deon Pilling RN, BSN Entered By: Deon Pilling on 05/15/2021 16:24:11 -------------------------------------------------------------------------------- Wound Assessment Details Patient Name: Date of Service: [name]. 05/15/2021 4:00 PM Medical Record Number: [id number] Patient Account Number: [id number] Date of Birth/Sex: Treating RN: 1951/01/30 (70 y.o. [name] Primary Care Kourtney Montesinos: [name] Other Clinician: Referring Finneus Kaneshiro: Treating Currie Dennin/Extender: Cornelious Bryant in Treatment: 4 Wound Status Wound Number: 18 Primary Etiology: Diabetic Wound/Ulcer of the Lower Extremity Wound Location: Left, Plantar Metatarsal head first Wound Status: Open Wounding Event: Gradually Appeared Date Acquired: 08/23/2020 Weeks Of Treatment: 4 Clustered Wound: No Wound Measurements Length: (cm) 3.5 Width: (cm) 4 Depth: (cm) 0.1 Area: (cm) 10.996 Volume: (cm) 1.1 % Reduction in Area: 22.2% % Reduction in Volume: 61.1% Wound Description Classification: Grade 2 Exudate Amount: Large Exudate Type: Serosanguineous Exudate Color: red, brown Treatment Notes Wound #18 (Metatarsal head first) Wound Laterality: Plantar, Left Cleanser Normal Saline Discharge Instruction: Cleanse the wound with Normal Saline prior to applying a clean dressing using gauze sponges, not tissue or cotton balls. Soap and Water Discharge Instruction: May shower and wash wound with dial antibacterial soap and water prior to dressing change. Wound Cleanser Discharge Instruction: Cleanse the wound with wound cleanser prior to applying a clean dressing using gauze sponges, not tissue or cotton balls. Peri-Wound Care Triamcinolone 15 (g) Discharge Instruction: Use triamcinolone 15 (g) as directed Zinc Oxide Ointment 30g tube Discharge Instruction: Apply Zinc Oxide to periwound with each dressing change Sween Lotion (Moisturizing lotion) Discharge Instruction: Apply moisturizing lotion as directed Topical Primary Dressing Hydrofera Blue Classic Foam, 4x4 in Discharge Instruction: Moisten with saline prior to applying to wound bed Secondary Dressing ABD Pad, 5x9 Discharge Instruction: Apply over primary dressing as directed. Zetuvit Plus 4x8 in Discharge Instruction: Apply over primary dressing as directed. CarboFLEX Odor Control Dressing, 4x4 in Discharge Instruction: Apply over  primary dressing as directed. Secured With Compression Wrap Unnaboot w/Calamine, 4x10 (in/yd) Discharge Instruction: Apply Unnaboot as directed. May also use Miliken CoFlex Calamine 2 layer compression system as alternative. Compression Stockings Add-Ons Electronic Signature(s) Signed: 05/15/2021 4:58:35 PM By: Deon Pilling RN, BSN Entered By: Deon Pilling on 05/15/2021 16:21:03 -------------------------------------------------------------------------------- Wound Assessment Details Patient Name: Date of Service: [name]. 05/15/2021 4:00 PM Medical Record Number: [id number] Patient Account Number: [id number] Date of Birth/Sex: Treating RN: [birth year] (70 y.o. [name] Primary Care Ahniyah Giancola: [name] Other Clinician: Referring Tritia Endo: Treating Oluwatimilehin Balfour/Extender: Cornelious Bryant in Treatment: 4 Wound Status Wound Number: 19 Primary Etiology: Diabetic Wound/Ulcer of the Lower Extremity Wound Location: Left, Anterior Lower Leg Wound Status: Open Wounding Event: Gradually Appeared Date Acquired: 08/23/2020 Weeks Of Treatment: 4 Clustered Wound: No Wound Measurements Length: (cm) 4.5 Width: (cm) 4 Depth: (cm) 0.3 Area: (cm) 14.137 Volume: (cm) 4.241 % Reduction in Area: 2.2% % Reduction in Volume: 2.2% Wound Description Classification: Grade 2 Exudate Amount: Large Exudate Type: Purulent Exudate Color: yellow, brown, green Treatment Notes Wound #19 (Lower Leg) Wound Laterality: Left, Anterior Cleanser Peri-Wound Care Triamcinolone 15 (g) Discharge Instruction: Use triamcinolone 15 (g) as directed  Zinc Oxide Ointment 30g tube Discharge Instruction: Apply Zinc Oxide to periwound with each dressing change Sween Lotion (Moisturizing lotion) Discharge Instruction: Apply moisturizing lotion as directed Topical Primary Dressing Hydrofera Blue Classic Foam, 4x4 in Discharge Instruction: Moisten with saline prior to applying to  wound bed Secondary Dressing ABD Pad, 5x9 Discharge Instruction: Apply over primary dressing as directed. Zetuvit Plus 4x8 in Discharge Instruction: Apply over primary dressing as directed. CarboFLEX Odor Control Dressing, 4x4 in Discharge Instruction: Apply over primary dressing as directed. Secured With Compression Wrap Unnaboot w/Calamine, 4x10 (in/yd) Discharge Instruction: Apply Unnaboot as directed. May also use Miliken CoFlex Calamine 2 layer compression system as alternative. Compression Stockings Add-Ons Electronic Signature(s) Signed: 05/15/2021 4:58:35 PM By: Deon Pilling RN, BSN Entered By: Deon Pilling on 05/15/2021 16:21:03 -------------------------------------------------------------------------------- Wound Assessment Details Patient Name: Date of Service: [name]. 05/15/2021 4:00 PM Medical Record Number: [id number] Patient Account Number: [id number] Date of Birth/Sex: Treating RN: [birth year] (70 y.o. [name] Primary Care Shaarav Ripple: [name] Other Clinician: Referring Jeanell Mangan: Treating Evelise Reine/Extender: Cornelious Bryant in Treatment: 4 Wound Status Wound Number: 21 Primary Etiology: Diabetic Wound/Ulcer of the Lower Extremity Wound Location: Left, Medial Lower Leg Wound Status: Open Wounding Event: Gradually Appeared Date Acquired: 08/23/2020 Weeks Of Treatment: 4 Clustered Wound: No Wound Measurements Length: (cm) 1.6 Width: (cm) 1 Depth: (cm) 0.4 Area: (cm) 1.257 Volume: (cm) 0.503 % Reduction in Area: 57.3% % Reduction in Volume: 43.1% Wound Description Classification: Grade 2 Exudate Amount: Medium Exudate Type: Purulent Exudate Color: yellow, brown, green Treatment Notes Wound #21 (Lower Leg) Wound Laterality: Left, Medial Cleanser Normal Saline Discharge Instruction: Cleanse the wound with Normal Saline prior to applying a clean dressing using gauze sponges, not tissue or cotton balls. Soap and  Water Discharge Instruction: May shower and wash wound with dial antibacterial soap and water prior to dressing change. Wound Cleanser Discharge Instruction: Cleanse the wound with wound cleanser prior to applying a clean dressing using gauze sponges, not tissue or cotton balls. Peri-Wound Care Triamcinolone 15 (g) Discharge Instruction: Use triamcinolone 15 (g) as directed Zinc Oxide Ointment 30g tube Discharge Instruction: Apply Zinc Oxide to periwound with each dressing change Sween Lotion (Moisturizing lotion) Discharge Instruction: Apply moisturizing lotion as directed Topical Primary Dressing Hydrofera Blue Classic Foam, 4x4 in Discharge Instruction: Moisten with saline prior to applying to wound bed Secondary Dressing ABD Pad, 5x9 Discharge Instruction: Apply over primary dressing as directed. Zetuvit Plus 4x8 in Discharge Instruction: Apply over primary dressing as directed. CarboFLEX Odor Control Dressing, 4x4 in Discharge Instruction: Apply over primary dressing as directed. Secured With Compression Wrap Unnaboot w/Calamine, 4x10 (in/yd) Discharge Instruction: Apply Unnaboot as directed. May also use Miliken CoFlex Calamine 2 layer compression system as alternative. Compression Stockings Add-Ons Electronic Signature(s) Signed: 05/15/2021 4:58:35 PM By: Deon Pilling RN, BSN Entered By: Deon Pilling on 05/15/2021 16:21:03 -------------------------------------------------------------------------------- Vitals Details Patient Name: Date of Service: [name]. 05/15/2021 4:00 PM Medical Record Number: [id number] Patient Account Number: [id number] Date of Birth/Sex: Treating RN: [birth year] (70 y.o. [name] Primary Care Galen Russman: [name] Other Clinician: Referring Ayen Viviano: Treating Deklin Bieler/Extender: Cornelious Bryant in Treatment: 4 Vital Signs Time Taken: 15:55 Temperature (F): 98.8 Height (in): 74 Pulse (bpm):  73 Weight (lbs): 238 Respiratory Rate (breaths/min): 20 Body Mass Index (BMI): 30.6 Blood Pressure (mmHg): 163/75 Reference Range: 80 - 120 mg / dl Electronic Signature(s) Signed: 05/15/2021 4:58:35 PM By: Deon Pilling RN, BSN Entered By: Deon Pilling  on 05/15/2021 16:20:39

## 2021-05-15 NOTE — Progress Notes (Signed)
[name], [name] ([id number]) Visit Report for 05/15/2021 SuperBill Details Patient Name: Date of Service: [name], [name] 05/15/2021 Medical Record Number: [id number] Patient Account Number: [id number] Date of Birth/Sex: Treating RN: 1950-11-03 (70 y.o. 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

## 2021-05-22 ENCOUNTER — Encounter (HOSPITAL_BASED_OUTPATIENT_CLINIC_OR_DEPARTMENT_OTHER): Payer: BC Managed Care – PPO | Admitting: Internal Medicine

## 2021-05-25 ENCOUNTER — Encounter (HOSPITAL_BASED_OUTPATIENT_CLINIC_OR_DEPARTMENT_OTHER): Payer: BC Managed Care – PPO | Attending: Internal Medicine | Admitting: Internal Medicine

## 2021-05-25 ENCOUNTER — Other Ambulatory Visit (HOSPITAL_COMMUNITY): Payer: Self-pay | Admitting: Internal Medicine

## 2021-05-25 ENCOUNTER — Ambulatory Visit (HOSPITAL_COMMUNITY)
Admission: RE | Admit: 2021-05-25 | Discharge: 2021-05-25 | Disposition: A | Discharge status: Home / Self Care | Payer: BC Managed Care – PPO | Source: Ambulatory Visit | Attending: Internal Medicine | Admitting: Internal Medicine

## 2021-05-25 ENCOUNTER — Other Ambulatory Visit: Payer: Self-pay

## 2021-05-25 ENCOUNTER — Encounter (HOSPITAL_BASED_OUTPATIENT_CLINIC_OR_DEPARTMENT_OTHER): Payer: BC Managed Care – PPO | Admitting: Internal Medicine

## 2021-05-25 DIAGNOSIS — E1142 Type 2 diabetes mellitus with diabetic polyneuropathy: Secondary | ICD-10-CM | POA: Insufficient documentation

## 2021-05-25 DIAGNOSIS — E11621 Type 2 diabetes mellitus with foot ulcer: Secondary | ICD-10-CM | POA: Diagnosis not present

## 2021-05-25 DIAGNOSIS — E1122 Type 2 diabetes mellitus with diabetic chronic kidney disease: Secondary | ICD-10-CM | POA: Insufficient documentation

## 2021-05-25 DIAGNOSIS — M79605 Pain in left leg: Secondary | ICD-10-CM

## 2021-05-25 DIAGNOSIS — L97828 Non-pressure chronic ulcer of other part of left lower leg with other specified severity: Secondary | ICD-10-CM | POA: Insufficient documentation

## 2021-05-25 DIAGNOSIS — N183 Chronic kidney disease, stage 3 unspecified: Secondary | ICD-10-CM | POA: Diagnosis not present

## 2021-05-25 DIAGNOSIS — E1151 Type 2 diabetes mellitus with diabetic peripheral angiopathy without gangrene: Secondary | ICD-10-CM | POA: Diagnosis not present

## 2021-05-25 DIAGNOSIS — L97528 Non-pressure chronic ulcer of other part of left foot with other specified severity: Secondary | ICD-10-CM | POA: Diagnosis not present

## 2021-05-25 DIAGNOSIS — I129 Hypertensive chronic kidney disease with stage 1 through stage 4 chronic kidney disease, or unspecified chronic kidney disease: Secondary | ICD-10-CM | POA: Diagnosis not present

## 2021-05-25 DIAGNOSIS — L02416 Cutaneous abscess of left lower limb: Secondary | ICD-10-CM | POA: Insufficient documentation

## 2021-05-25 DIAGNOSIS — I890 Lymphedema, not elsewhere classified: Secondary | ICD-10-CM | POA: Diagnosis not present

## 2021-05-25 DIAGNOSIS — M7989 Other specified soft tissue disorders: Secondary | ICD-10-CM | POA: Diagnosis present

## 2021-05-25 NOTE — Progress Notes (Signed)
Lower extremity venous has been completed.   Preliminary results in CV Proc.   [name] 05/25/2021 2:32 PM

## 2021-05-25 NOTE — Progress Notes (Addendum)
AMARE, BAIL ([id number]) Visit Report for 05/25/2021 HPI Details Patient Name: Date of Service: YAZEED, PRYER 05/25/2021 9:30 A M Medical Record Number: [id number] Patient Account Number: [id number] Date of Birth/Sex: Treating RN: 1950/09/06 (70 y.o. [name] Primary Care Provider: [name] Other Clinician: Referring Provider: Treating Provider/Extender: Stormy Card in Treatment: 6 History of Present Illness HPI Description: 10/11/17; Mr. Rowe is a 70 year old man who tells me that in 2015 he slipped down the latter traumatizing his left leg. He developed a wound in the same spot the area that we are currently looking at. He states this closed over for the most part although he always felt it was somewhat unstable. In 2016 he hit the same area with the door of his car had this reopened. He tells me that this is never really closed although sometimes an inflow it remains open on a constant basis. He has not been using any specific dressing to this except for topical antibiotics the nature of which were not really sure. His primary doctor did send him to see Dr. [name] of interventional cardiology. He underwent an angiogram on 08/06/17 and he underwent a PTA and directional atherectomy of the lesser distal SFA and popliteal arteries which resulted in brisk improvement in blood flow. It was noted that he had 2 vessel runoff through the anterior tibial and peroneal. He is also been to see vascular and interventional radiologist. He was not felt to have any significant superficial venous insufficiency. Presumably is not a candidate for any ablation. It was suggested he come here for wound care. The patient is a type II diabetic on insulin. He also has a history of venous insufficiency. ABIs on the left were noncompressible in our clinic 10/21/17; patient we admitted to the clinic last week. 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 We put Iodosorb on him after an aggressive debridement and 3 layer compression. He complained of pain in his ankle and itching with is skin in fact he scratched the area on the medial calf superiorly at the rim of our wraps and he has 2 small open areas in that location today which are new. I changed his primary dressing today to silver collagen. As noted he is already had revascularization and does not have any significant superficial venous insufficiency that would be amenable to ablation 10/28/17; patient admitted to the clinic 2 weeks ago. He has a smaller Wound. Scratch injury from last week revealed. There is large wound over the tibial area. This is smaller. Granulation looks healthy. No need for debridement. 11/04/17; the wound on the left lateral calf looks better. Improved dimensions. Surface of this looks better. We've been maintaining him and Kerlix Coban wraps. He finds this much more comfortable. Silver collagen dressing 11/11/17; left lateral Wound continues to look healthy be making progress. Using a #5 curet I removed removed nonviable skin from the surface of the wound and then necrotic debris from the wound surface. Surface of the wound continues to look healthy. He also has an open area on the left great toenail bed. We've been using topical antibiotics. 11/19/17; left anterior lateral wound continues to look healthy but it's not closed. He also had a small wound above this on the left leg Initially traumatic wounds in the setting of significant chronic venous insufficiency and stasis dermatitis 11/25/17; left anterior wounds superiorly is closed still a small wound inferiorly.  12/02/17; left anterior tibial area. Arrives today with adherent callus. Post debridement clearly not completely closed. Hydrofera Blue under 3 layer compression. 12/09/17; left anterior tibia. Circumferential eschar however the wound bed looks stable to improved. We've been  using Hydrofera Blue under 3 layer compression 12/17/17; left anterior tibia. Apparently this was felt to be closed however when the wrap was taken off there is a skin tear to reopen wounds in the same area we've been using Hydrofera Blue under 3 layer compression 12/23/17 left anterior tibia. Not close to close this week apparently the [facility] was stuck to this again. Still circumferential eschar requiring debridement. I put a contact layer on this this time under the Hydrofera Blue 12/31/17; left anterior tibia. Wound is better slight amount of hyper-granulation. Using Hydrofera Blue over Adaptic. 01/07/18; left anterior tibia. The wound had some surface eschar however after this was removed he has no open wound.he was already revascularized by Dr. [name] when he came to our clinic with atherectomy of the left SFA and popliteal artery. He was also sent to interventional radiology for venous reflux studies. He was not felt to have significant reflux but certainly has chronic venous changes of his skin with hemosiderin deposition around this area. He will definitely need to lubricate his skin and wear compression stocking and I've talked to him about this. READMISSION 05/26/2018 This is a now 70 year old man we cared for with traumatic wounds on his left anterior lower extremity. He had been previously revascularized during that admission by Dr. [name]. Apparently in follow-up Dr. [name] noted that he had deterioration in his arterial status. He underwent a stent placement in the distal left SFA on 04/22/2018. 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 He went back to the angiography suite on 04/30/2018 he underwent PTA and balloon angioplasty of the occluded left mid anterior tibial artery, thrombotic occlusion went from 100 to 0% which reconstitutes the posterior tibial artery. He had thrombectomy and aspiration of the peroneal artery. The stent placed in the distal SFA left SFA was  still occluded. 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 He has been recuperating at [facility] after the most recent hospitalization. He is going home tomorrow. He tells me that 2 weeks ago he traumatized the tip of his left fifth toe. He came in urgently for our review of this. This was a history of before I noted that Dr. [name] had already noted dry gangrenous changes of the left fifth toe 06/09/2018; 2-week follow-up. I did contact Dr. [name] after his last appointment and he apparently saw 1 of Dr. Irven Shelling colleagues the next day. He does not follow-up with Dr. [name] himself until Thursday of this week. 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 Nevertheless there is no evidence of infection no drainage and no pain. He had a new area that this week when we were signing him in today on the left anterior mid tibia area, this is in close proximity to the previous wound we have dealt with in this clinic. 06/23/2018; 2-week follow-up. I did not receive a recent note from Dr. [name] to review today. Our office is trying to obtain this. He is  apparently not planning to do further vascular interventions and wondered about compression to try and help with the patient's chronic venous insufficiency. However we are also concerned about the arterial flow. He arrives in clinic today with a new area on the left third toe. The areas on the calf/anterior tibia are close to closing. The left fifth toe is still mummified using Betadine. -In reviewing things with the patient he has what sounds like claudication with mild to moderate amount of activity. 06/27/2018; x-ray of his foot suggested osteomyelitis of the left third  toe. I prescribed Levaquin over the phone while we attempted to arrange a plan of care. However the patient called yesterday to report he had low-grade fever and he came in today acutely. There is been a marked deterioration in the left third toe with spreading cellulitis up into the dorsal left foot. He was referred to the emergency room. Readmission: 06/29/2020 patient presents today for reevaluation here in our clinic he was previously treated by Dr. Dellia Nims at the latter part of 2019 in 2 the beginning of 2020. Subsequently we have not seen him since that time in the interim he did have evaluation with vein and vascular specialist specifically Dr. Anice Paganini who did perform quite extensive work for a left femoral to anterior tibial artery bypass. With that being said in the interim the patient has developed significant lymphedema and has wounds that he tells me have really never healed in regard to the incision site on the left leg. He also has multiple wounds on the feet for various reasons some of which is that he tends to pick at his feet. Fortunately there is no signs of active infection systemically at this time he does have some wounds that are little bit deeper but most are fairly superficial he seems to have good blood flow and overall everything appears to be healthy I see no bone exposed and no obvious signs of osteomyelitis. I do not know that he necessarily needs a x-ray at this point although that something we could consider depending on how things progress. The patient does have a history of lymphedema, diabetes, this is type II, chronic kidney disease stage III, hypertension, and history of peripheral vascular disease. 07/05/2020; patient admitted last week. Is a patient I remember from 2019 he had a spreading infection involving the left foot and we sent him to the hospital. 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 He subsequently had a left  femoral to anterior tibial bypass by Dr.Cain vein and vascular. He also has severe lymphedema with chronic skin changes related to that on the left leg. The most problematic area that was new today was on the left medial great toe. This was apparently a small area last week there was purulent drainage which our intake nurse cultured. Also areas on the left medial foot and heel left lateral foot. He has 2 areas on the left medial calf left lateral calf in the setting of the severe lymphedema. 07/13/2020 on evaluation today patient appears to be doing better in my opinion compared to his last visit. The good news is there is no signs of active infection systemically and locally I do not see any signs of infection either. He did have an x-ray which was negative that is great news he had a culture which showed MRSA but at the same time he is been on the doxycycline which has helped. I do think we  may want to extend this for 7 additional days 1/25; patient admitted to the clinic a few weeks ago. 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 We have been putting him under compression his edema control is a lot better but he is severe verricused skin on the left leg. He is really done quite well he still has an open area on the left medial calf and the left medial first metatarsal head. We have been using silver collagen on the leg silver alginate on the foot 07/27/2020 upon evaluation today patient appears to be doing decently well in regard to his wounds. He still has a lot of dry skin on the left leg. Some of this is starting to peel back and I think he may be able to have them out by removing some that today. Fortunately there is no signs of active infection at this time on the left leg although on the right leg he does appear to have swelling and erythema as well as some mild warmth to touch. This does have been concerned about the possibility of cellulitis although  within the differential diagnosis I do think that potentially a DVT has to be at least considered. We need to rule that out before proceeding would just call in the cellulitis. Especially since he is having pain in the posterior aspect of his calf muscle. 2/8; the patient had seen sparingly. He has severe skin changes of chronic lymphedema in the left leg thickened hyperkeratotic verrucous skin. He has an open wound on the medial part of the left first met head left mid tibia. He also has a rim of nonepithelialized skin in the anterior mid tibia. He brought in the AmLactin lotion that was been prescribed although I am not sure under compression and its utility. There concern about cellulitis on the right lower leg the last time he was here. He was put on on antibiotics. His DVT rule out was negative. The right leg looks fine he is using his stocking on this area 08/10/2020 upon evaluation today patient appears to be doing well with regard to his leg currently. He has been tolerating the dressing changes without complication. Fortunately there is no signs of active infection which is great news. Overall very pleased with where things stand. 2/22; the patient still has an area on the medial part of the left first met his head. This looks better than when I last saw this earlier this month he has a rim of epithelialization but still some surface debris. Mostly everything on the left leg is healed. There is still a vulnerable in the left mid tibia area. 08/30/2020 upon evaluation today patient appears to be doing much better in regard to his wounds on his foot. 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 Nonetheless I think we will address that today I Minna send in a prescription for him in that regard. Overall though there does not appear to be any signs of significant  worsening. 09/07/2020 on evaluation today patient's wounds over his left foot appear to be doing excellent. I do not see any signs of infection there is some callus buildup this can require debridement for certain but overall I feel like he is managing quite nicely. He still using the AmLactin cream which has been beneficial for him as well. 3/22; left  foot wound is closed. There is no open area here. He is using ammonium lactate lotion to the lower extremities to help exfoliate dry cracked skin. He has compression stockings from elastic therapy in [place]. The wound on the medial part of his left first met head is healed today. READMISSION 04/12/2021 Mr. Lisby is a patient we know fairly well he had a prolonged stay in clinic in 2019 with wounds on his left lateral and left anterior lower extremity in the setting of chronic venous insufficiency. More recently he was here earlier this year with predominantly an area on his left foot first metatarsal head plantar and he says the plantar foot broke down on its not long after we discharged him but he did not come back here. The last few months areas of broken down on his left anterior and again the left lateral lower extremity. The leg itself is very swollen chronically enlarged a lot of hyperkeratotic dry Berry Q skin in the left lower leg. His edema extends well into the thigh. He was seen by Dr. Donzetta Matters. He had ABIs on 03/02/2021 showing an ABI on the right of 1 with a TBI of 0.72 his ABI in the left at 1.09 TBI of 0.99. Monophasic and biphasic waveforms on the right. On the left monophasic waveforms were noted he went on to have an angiogram on 03/27/2021 this showed the aortic aortic and iliac segments were free of flow-limiting stenosis the left common femoral vein to evaluate the left femoral to anterior tibial artery bypass was unobstructed the bypass was patent without any areas of stenosis. We discharged the patient in bilateral juxta lite  stockings but very clearly that was not sufficient to control the swelling and maintain skin integrity. He is clearly going to need compression pumps. 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 This is fortunate because he is on his feet for long periods of time. 10/27; patient comes in with our intake nurse reporting copious amount of green drainage from the left anterior mid tibia the left dorsal foot and to a lesser extent the left medial mid tibia. We left the compression wrap on all week for the amount of edema in his left leg is quite a bit better. We use silver alginate as the primary dressing 11/3; edema control is good. Left anterior lower leg left medial lower leg and the plantar first metatarsal head. The left anterior lower leg required debridement. Deep tissue culture I did of this wound showed MRSA I put him on 10 days of doxycycline which she will start today. We have him in compression wraps. He has a security card and [employer] however he is retiring on November 15. We will need to then get him into a better offloading boot for the left foot perhaps a total contact cast 11/10; edema control is quite good. Left anterior and left medial lower leg wounds in the setting of chronic venous insufficiency and lymphedema. He also has a substantial area over the left plantar first metatarsal head. 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 He has significant hypergranulation on the left plantar foot wound. The patient is a diabetic but he does not have significant PAD 11/17; edema control is quite good. Left anterior and left medial lower leg wounds look better. The really concerning area remains the  area on the left plantar first metatarsal head. He has a rim of epithelialization. He has been using a surgical shoe The patient is now retired from a a  [employer] I have gone over with him the need to offload this area aggressively. Starting today with a forefoot off loader but . possibly a total contact cast. 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 Arrives with a very significant odor from I think all of the wounds on the left leg and the left foot. Because of this I did not put a total contact cast on him today but will could still consider this. His wife was having cataract surgery which is the reason he missed the appointment Electronic Signature(s) Signed: 05/25/2021 4:34:24 PM By: Linton Ham MD Entered By: Linton Ham on 05/25/2021 10:12:00 -------------------------------------------------------------------------------- Physical Exam Details Patient Name: Date of Service: [name] 05/25/2021 9:30 A M Medical Record Number: [id number] Patient Account Number: [id number] Date of Birth/Sex: Treating RN: [birth year] (70 y.o. [name] Primary Care Provider: [name] Other Clinician: Referring Provider: Treating Provider/Extender: Stormy Card in Treatment: 6 Constitutional Sitting or standing Blood Pressure is within target range for patient.. Pulse regular and within target range for patient.Marland Kitchen Respirations regular, non-labored and within target range.. Temperature is normal and within the target range for the patient.Marland Kitchen Appears in no distress. Cardiovascular Pedal pulses palpable. Edema control is adequate. Notes Wound exam; left anterior lower leg and left lateral lower leg both of these look smaller surprisingly healthy looking granulation. The area on the plantar left first metatarsal head also looks quite reasonable in terms of granulation. We use MolecuLight to look at the surface of this which was negative. Electronic Signature(s) Signed: 05/25/2021 4:34:24 PM By:  Linton Ham MD Entered By: Linton Ham on 05/25/2021 10:13:18 -------------------------------------------------------------------------------- Physician Orders Details Patient Name: Date of Service: [name] 05/25/2021 9:30 A M Medical Record Number: [id number] Patient Account Number: [id number] Date of Birth/Sex: Treating RN: 22-Jan-1951 (70 y.o. [name], [name] Primary Care Provider: [name] Other Clinician: Referring Provider: Treating Provider/Extender: Stormy Card in Treatment: 6 Verbal / Phone Orders: No Diagnosis Coding ICD-10 Coding Code Description E11.621 Type 2 diabetes mellitus with foot ulcer 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 I89.0 Lymphedema, not elsewhere classified I87.322 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verity 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 of other part of left foot with 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 - Dr. Dellia Nims Thursday Return A Nurse Visit: - today at 3:30 and Monday Bathing/ Shower/ Hygiene May shower with protection but do not get wound dressing(s) wet. - Use a cast protector so you can shower without getting your wrap(s) wet Edema Control - Lymphedema / SCD / Other Elevate legs to the level of the heart or above for 30 minutes daily and/or when sitting, a frequency of: Avoid standing for long periods of time. Patient to wear own compression stockings every day. - on right leg; Moisturize legs daily. - Ammonium LACTATE to BLE every day. Off-Loading Wedge shoe to: - left front foot offloader Wound Treatment Wound #18 - Metatarsal head first Wound Laterality: Plantar, Left  Cleanser: Normal Saline 1 x Per Week/7  Days Discharge Instructions: Cleanse the wound with Normal Saline prior to applying a clean dressing using gauze sponges, not tissue or cotton balls. Cleanser: Soap and Water 1 x Per Week/7 Days Discharge Instructions: May shower and wash wound with dial antibacterial soap and water prior to dressing change. Cleanser: Wound Cleanser 1 x Per Week/7 Days Discharge Instructions: Cleanse the wound with wound cleanser prior to applying a clean dressing using gauze sponges, not tissue or cotton balls. Peri-Wound Care: Triamcinolone 15 (g) 1 x Per Week/7 Days Discharge Instructions: Use triamcinolone 15 (g) as directed Peri-Wound Care: Zinc Oxide Ointment 30g tube 1 x Per Week/7 Days Discharge Instructions: Apply Zinc Oxide to periwound with each dressing change Peri-Wound Care: Sween Lotion (Moisturizing lotion) 1 x Per Week/7 Days Discharge Instructions: Apply moisturizing lotion as directed Prim Dressing: Hydrofera Blue Classic Foam, 4x4 in 1 x Per Week/7 Days ary Discharge Instructions: Moisten with saline prior to applying to wound bed Secondary Dressing: ABD Pad, 5x9 1 x Per Week/7 Days Discharge Instructions: Apply over primary dressing as directed. Secondary Dressing: Zetuvit Plus 4x8 in 1 x Per Week/7 Days Discharge Instructions: Apply over primary dressing as directed. Secondary Dressing: CarboFLEX Odor Control Dressing, 4x4 in 1 x Per Week/7 Days Discharge Instructions: Apply over primary dressing as directed. Compression Wrap: Unnaboot w/Calamine, 4x10 (in/yd) 1 x Per Week/7 Days Discharge Instructions: Apply Unnaboot as directed. May also use Miliken CoFlex Calamine 2 layer compression system as alternative. Wound #19 - Lower Leg Wound Laterality: Left, Anterior Cleanser: Normal Saline 1 x Per Week/7 Days Discharge Instructions: Cleanse the wound with Normal Saline prior to applying a clean dressing using gauze sponges, not tissue or cotton balls. Cleanser: Soap and Water 1 x Per  Week/7 Days Discharge Instructions: May shower and wash wound with dial antibacterial soap and water prior to dressing change. Cleanser: Wound Cleanser 1 x Per Week/7 Days Discharge Instructions: Cleanse the wound with wound cleanser prior to applying a clean dressing using gauze sponges, not tissue or cotton balls. Peri-Wound Care: Triamcinolone 15 (g) 1 x Per Week/7 Days Discharge Instructions: Use triamcinolone 15 (g) as directed Peri-Wound Care: Zinc Oxide Ointment 30g tube 1 x Per Week/7 Days Discharge Instructions: Apply Zinc Oxide to periwound with each dressing change Peri-Wound Care: Sween Lotion (Moisturizing lotion) 1 x Per Week/7 Days Discharge Instructions: Apply moisturizing lotion as directed Prim Dressing: Hydrofera Blue Classic Foam, 4x4 in 1 x Per Week/7 Days ary Discharge Instructions: Moisten with saline prior to applying to wound bed Secondary Dressing: ABD Pad, 5x9 1 x Per Week/7 Days Discharge Instructions: Apply over primary dressing as directed. Secondary Dressing: Zetuvit Plus 4x8 in 1 x Per Week/7 Days Discharge Instructions: Apply over primary dressing as directed. Secondary Dressing: CarboFLEX Odor Control Dressing, 4x4 in 1 x Per Week/7 Days Discharge Instructions: Apply over primary dressing as directed. Compression Wrap: Unnaboot w/Calamine, 4x10 (in/yd) 1 x Per Week/7 Days Discharge Instructions: Apply Unnaboot as directed. May also use Miliken CoFlex Calamine 2 layer compression system as alternative. Wound #21 - Lower Leg Wound Laterality: Left, Medial Cleanser: Normal Saline 1 x Per Week/7 Days Discharge Instructions: Cleanse the wound with Normal Saline prior to applying a clean dressing using gauze sponges, not tissue or cotton balls. Cleanser: Soap and Water 1 x Per Week/7 Days Discharge Instructions: May shower and wash wound with dial antibacterial soap and water prior to dressing change. Cleanser: Wound Cleanser 1 x Per Week/7 Days Discharge  Instructions: Cleanse the wound with wound cleanser prior to applying a clean dressing using gauze sponges, not tissue or cotton balls. Peri-Wound Care: Triamcinolone 15 (g) 1 x Per Week/7 Days Discharge Instructions: Use triamcinolone 15 (g) as directed Peri-Wound Care: Zinc Oxide Ointment 30g tube 1 x Per Week/7 Days Discharge Instructions: Apply Zinc Oxide to periwound with each dressing change Peri-Wound Care: Sween Lotion (Moisturizing lotion) 1 x Per Week/7 Days Discharge Instructions: Apply moisturizing lotion as directed Prim Dressing: Hydrofera Blue Classic Foam, 4x4 in 1 x Per Week/7 Days ary Discharge Instructions: Moisten with saline prior to applying to wound bed Secondary Dressing: ABD Pad, 5x9 1 x Per Week/7 Days Discharge Instructions: Apply over primary dressing as directed. Secondary Dressing: Zetuvit Plus 4x8 in 1 x Per Week/7 Days Discharge Instructions: Apply over primary dressing as directed. Secondary Dressing: CarboFLEX Odor Control Dressing, 4x4 in 1 x Per Week/7 Days Discharge Instructions: Apply over primary dressing as directed. Compression Wrap: Unnaboot w/Calamine, 4x10 (in/yd) 1 x Per Week/7 Days Discharge Instructions: Apply Unnaboot as directed. May also use Miliken CoFlex Calamine 2 layer compression system as alternative. Services and Therapies Venous Duplex Doppler - STAT r/o DVT Left Leg Electronic Signature(s) Signed: 05/25/2021 4:34:24 PM By: Linton Ham MD Signed: 05/25/2021 5:20:51 PM By: Rhae Hammock RN Entered By: Rhae Hammock on 05/25/2021 11:00:39 Prescription 05/25/2021 -------------------------------------------------------------------------------- Gerlean Ren MD Patient Name: Provider: [birth year] [id number] Date of Birth: NPI#: [name] [id number] Sex: DEA #: [phone] [id number] Phone #: License #: [place] Patient Address: [address] [place], [place] [address] [place], [place] [address] [phone] Allergies No Known Drug Allergies Provider's Orders Venous Duplex Doppler - STAT r/o DVT Left Leg Hand Signature: Date(s): Electronic Signature(s) Signed: 05/25/2021 4:34:24 PM By: Linton Ham MD Signed: 05/25/2021 5:20:51 PM By: Rhae Hammock RN Entered By: Rhae Hammock on 05/25/2021 11:00:40 -------------------------------------------------------------------------------- Problem List Details Patient Name: Date of Service: [name] 05/25/2021 9:30 A M Medical Record Number: [id number] Patient Account Number: [id number] Date of Birth/Sex: Treating RN: 17-Apr-1951 (70 y.o. [name], [name] Primary Care Provider: [name] Other Clinician: Referring Provider: Treating Provider/Extender: Stormy Card in Treatment: 6 Active Problems ICD-10 Encounter Code Description Active Date MDM Diagnosis E11.621 Type 2 diabetes mellitus with foot ulcer 04/12/2021 No Yes 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 04/12/2021 No Yes I89.0 Lymphedema, not elsewhere classified 04/12/2021 No Yes I87.322 Chronic venous hypertension (idiopathic) with inflammation of left lower 04/12/2021 No Yes extremity 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 ulcer of other part of left lower leg with other specified 04/12/2021 No Yes severity 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 of other part of left foot with other specified 04/12/2021 No Yes severity E11.42 Type 2 diabetes mellitus with diabetic polyneuropathy 04/12/2021 No Yes Inactive Problems Resolved Problems Electronic Signature(s) Signed: 05/25/2021 4:34:24 PM By: Linton Ham MD Signed: 05/25/2021 4:34:24 PM By: Linton Ham MD Entered By: Linton Ham on 05/25/2021 10:10:39 -------------------------------------------------------------------------------- Progress Note Details Patient Name:  Date of Service: [name] 05/25/2021 9:30 A M Medical Record Number: [id number] Patient Account Number: [id number] Date of Birth/Sex: Treating RN: 1950-10-25 (70 y.o. [name] Primary Care Provider: [name] Other Clinician: Referring Provider: Treating Provider/Extender: Stormy Card in Treatment: 6 Subjective History of Present Illness (HPI) 10/11/17; Mr. Sinning is a 70 year old man who tells me that in 2015 he slipped down the latter traumatizing his left  leg. He developed a wound in the same spot the area that we are currently looking at. He states this closed over for the most part although he always felt it was somewhat unstable. In 2016 he hit the same area with the door of his car had this reopened. He tells me that this is never really closed although sometimes an inflow it remains open on a constant basis. He has not been using any specific dressing to this except for topical antibiotics the nature of which were not really sure. His primary doctor did send him to see Dr. [name] of interventional cardiology. He underwent an angiogram on 08/06/17 and he underwent a PTA and directional atherectomy of the lesser distal SFA and popliteal arteries which resulted in brisk improvement in blood flow. It was noted that he had 2 vessel runoff through the anterior tibial and peroneal. He is also been to see vascular and interventional radiologist. He was not felt to have any significant superficial venous insufficiency. Presumably is not a candidate for any ablation. It was suggested he come here for wound care. The patient is a type II diabetic on insulin. He also has a history of venous insufficiency. ABIs on the left were noncompressible in our clinic 10/21/17; patient we admitted to the clinic last week. 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 We put Iodosorb on him after an aggressive  debridement and 3 layer compression. He complained of pain in his ankle and itching with is skin in fact he scratched the area on the medial calf superiorly at the rim of our wraps and he has 2 small open areas in that location today which are new. I changed his primary dressing today to silver collagen. As noted he is already had revascularization and does not have any significant superficial venous insufficiency that would be amenable to ablation 10/28/17; patient admitted to the clinic 2 weeks ago. He has a smaller Wound. Scratch injury from last week revealed. There is large wound over the tibial area. This is smaller. Granulation looks healthy. No need for debridement. 11/04/17; the wound on the left lateral calf looks better. Improved dimensions. Surface of this looks better. We've been maintaining him and Kerlix Coban wraps. He finds this much more comfortable. Silver collagen dressing 11/11/17; left lateral Wound continues to look healthy be making progress. Using a #5 curet I removed removed nonviable skin from the surface of the wound and then necrotic debris from the wound surface. Surface of the wound continues to look healthy. ooHe also has an open area on the left great toenail bed. We've been using topical antibiotics. 11/19/17; left anterior lateral wound continues to look healthy but it's not closed. ooHe also had a small wound above this on the left leg ooInitially traumatic wounds in the setting of significant chronic venous insufficiency and stasis dermatitis 11/25/17; left anterior wounds superiorly is closed still a small wound inferiorly. 12/02/17; left anterior tibial area. Arrives today with adherent callus. Post debridement clearly not completely closed. Hydrofera Blue under 3 layer compression. 12/09/17; left anterior tibia. Circumferential eschar however the wound bed looks stable to improved. We've been using Hydrofera Blue under 3 layer compression 12/17/17; left anterior  tibia. Apparently this was felt to be closed however when the wrap was taken off there is a skin tear to reopen wounds in the same area we've been using Hydrofera Blue under 3 layer compression 12/23/17 left anterior  tibia. Not close to close this week apparently the [facility] was stuck to this again. Still circumferential eschar requiring debridement. I put a contact layer on this this time under the Hydrofera Blue 12/31/17; left anterior tibia. Wound is better slight amount of hyper-granulation. Using Hydrofera Blue over Adaptic. 01/07/18; left anterior tibia. The wound had some surface eschar however after this was removed he has no open wound.he was already revascularized by Dr. [name] when he came to our clinic with atherectomy of the left SFA and popliteal artery. He was also sent to interventional radiology for venous reflux studies. He was not felt to have significant reflux but certainly has chronic venous changes of his skin with hemosiderin deposition around this area. He will definitely need to lubricate his skin and wear compression stocking and I've talked to him about this. READMISSION 05/26/2018 This is a now 70 year old man we cared for with traumatic wounds on his left anterior lower extremity. He had been previously revascularized during that admission by Dr. [name]. Apparently in follow-up Dr. [name] noted that he had deterioration in his arterial status. He underwent a stent placement in the distal left SFA on 04/22/2018. 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 He went back to the angiography suite on 04/30/2018 he underwent PTA and balloon angioplasty of the occluded left mid anterior tibial artery, thrombotic occlusion went from 100 to 0% which reconstitutes the posterior tibial artery. He had thrombectomy and aspiration of the peroneal artery. The stent placed in the distal SFA left SFA was still occluded. He was discharged on Xarelto, it was noted on the  discharge summary from this hospitalization that he had gangrene at the tip of his left fifth toe and there were expectations this would auto amputate. 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 He has been recuperating at [facility] after the most recent hospitalization. He is going home tomorrow. He tells me that 2 weeks ago he traumatized the tip of his left fifth toe. He came in urgently for our review of this. This was a history of before I noted that Dr. [name] had already noted dry gangrenous changes of the left fifth toe 06/09/2018; 2-week follow-up. I did contact Dr. [name] after his last appointment and he apparently saw 1 of Dr. Irven Shelling colleagues the next day. He does not follow-up with Dr. [name] himself until Thursday of this week. 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 Nevertheless there is no evidence of infection no drainage and no pain. He had a new area that this week when we were signing him in today on the left anterior mid tibia area, this is in close proximity to the previous wound we have dealt with in this clinic. 06/23/2018; 2-week follow-up. I did not receive a recent note from Dr. [name] to review today. Our office is trying to obtain this. He is apparently not planning to do further vascular interventions and wondered about compression to try and help with the patient's chronic venous insufficiency. However we are also concerned about the arterial flow. ooHe arrives in clinic today with a new area on the left third toe. The areas on the calf/anterior tibia are close to closing. The left fifth toe is still mummified using Betadine. -In reviewing things with the patient he has what sounds like claudication with mild to moderate amount of activity. 06/27/2018; x-ray of his foot  suggested osteomyelitis of the left third toe. I prescribed Levaquin over the phone while we attempted to  arrange a plan of care. However the patient called yesterday to report he had low-grade fever and he came in today acutely. There is been a marked deterioration in the left third toe with spreading cellulitis up into the dorsal left foot. He was referred to the emergency room. Readmission: 06/29/2020 patient presents today for reevaluation here in our clinic he was previously treated by Dr. Dellia Nims at the latter part of 2019 in 2 the beginning of 2020. Subsequently we have not seen him since that time in the interim he did have evaluation with vein and vascular specialist specifically Dr. Anice Paganini who did perform quite extensive work for a left femoral to anterior tibial artery bypass. With that being said in the interim the patient has developed significant lymphedema and has wounds that he tells me have really never healed in regard to the incision site on the left leg. He also has multiple wounds on the feet for various reasons some of which is that he tends to pick at his feet. Fortunately there is no signs of active infection systemically at this time he does have some wounds that are little bit deeper but most are fairly superficial he seems to have good blood flow and overall everything appears to be healthy I see no bone exposed and no obvious signs of osteomyelitis. I do not know that he necessarily needs a x-ray at this point although that something we could consider depending on how things progress. The patient does have a history of lymphedema, diabetes, this is type II, chronic kidney disease stage III, hypertension, and history of peripheral vascular disease. 07/05/2020; patient admitted last week. Is a patient I remember from 2019 he had a spreading infection involving the left foot and we sent him to the hospital. 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 He subsequently had a left femoral to anterior tibial bypass by Dr.Cain vein and vascular.  He also has severe lymphedema with chronic skin changes related to that on the left leg. The most problematic area that was new today was on the left medial great toe. This was apparently a small area last week there was purulent drainage which our intake nurse cultured. Also areas on the left medial foot and heel left lateral foot. He has 2 areas on the left medial calf left lateral calf in the setting of the severe lymphedema. 07/13/2020 on evaluation today patient appears to be doing better in my opinion compared to his last visit. The good news is there is no signs of active infection systemically and locally I do not see any signs of infection either. He did have an x-ray which was negative that is great news he had a culture which showed MRSA but at the same time he is been on the doxycycline which has helped. I do think we may want to extend this for 7 additional days 1/25; patient admitted to the clinic a few weeks ago. 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 We have been putting him under compression his edema control is a lot better but he is severe verricused skin on the left leg. He is really done quite well he still has an open area on the left medial calf and the left medial first metatarsal head. We have been using silver collagen on  the leg silver alginate on the foot 07/27/2020 upon evaluation today patient appears to be doing decently well in regard to his wounds. He still has a lot of dry skin on the left leg. Some of this is starting to peel back and I think he may be able to have them out by removing some that today. Fortunately there is no signs of active infection at this time on the left leg although on the right leg he does appear to have swelling and erythema as well as some mild warmth to touch. This does have been concerned about the possibility of cellulitis although within the differential diagnosis I do think that potentially a  DVT has to be at least considered. We need to rule that out before proceeding would just call in the cellulitis. Especially since he is having pain in the posterior aspect of his calf muscle. 2/8; the patient had seen sparingly. He has severe skin changes of chronic lymphedema in the left leg thickened hyperkeratotic verrucous skin. He has an open wound on the medial part of the left first met head left mid tibia. He also has a rim of nonepithelialized skin in the anterior mid tibia. He brought in the AmLactin lotion that was been prescribed although I am not sure under compression and its utility. There concern about cellulitis on the right lower leg the last time he was here. He was put on on antibiotics. His DVT rule out was negative. The right leg looks fine he is using his stocking on this area 08/10/2020 upon evaluation today patient appears to be doing well with regard to his leg currently. He has been tolerating the dressing changes without complication. Fortunately there is no signs of active infection which is great news. Overall very pleased with where things stand. 2/22; the patient still has an area on the medial part of the left first met his head. This looks better than when I last saw this earlier this month he has a rim of epithelialization but still some surface debris. Mostly everything on the left leg is healed. There is still a vulnerable in the left mid tibia area. 08/30/2020 upon evaluation today patient appears to be doing much better in regard to his wounds on his foot. 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 Nonetheless I think we will address that today I Minna send in a prescription for him in that regard. Overall though there does not appear to be any signs of significant worsening. 09/07/2020 on evaluation today patient's wounds over his  left foot appear to be doing excellent. I do not see any signs of infection there is some callus buildup this can require debridement for certain but overall I feel like he is managing quite nicely. He still using the AmLactin cream which has been beneficial for him as well. 3/22; left foot wound is closed. There is no open area here. He is using ammonium lactate lotion to the lower extremities to help exfoliate dry cracked skin. He has compression stockings from [facility] therapy in [place]. The wound on the medial part of his left first met head is healed today. READMISSION 04/12/2021 Mr. Hobin is a patient we know fairly well he had a prolonged stay in clinic in 2019 with wounds on his left lateral and left anterior lower extremity in the setting of chronic venous insufficiency.  More recently he was here earlier this year with predominantly an area on his left foot first metatarsal head plantar and he says the plantar foot broke down on its not long after we discharged him but he did not come back here. The last few months areas of broken down on his left anterior and again the left lateral lower extremity. The leg itself is very swollen chronically enlarged a lot of hyperkeratotic dry Berry Q skin in the left lower leg. His edema extends well into the thigh. He was seen by Dr. Donzetta Matters. He had ABIs on 03/02/2021 showing an ABI on the right of 1 with a TBI of 0.72 his ABI in the left at 1.09 TBI of 0.99. Monophasic and biphasic waveforms on the right. On the left monophasic waveforms were noted he went on to have an angiogram on 03/27/2021 this showed the aortic aortic and iliac segments were free of flow-limiting stenosis the left common femoral vein to evaluate the left femoral to anterior tibial artery bypass was unobstructed the bypass was patent without any areas of stenosis. We discharged the patient in bilateral juxta lite stockings but very clearly that was not sufficient to control the swelling  and maintain skin integrity. He is clearly going to need compression pumps. 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 This is fortunate because he is on his feet for long periods of time. 10/27; patient comes in with our intake nurse reporting copious amount of green drainage from the left anterior mid tibia the left dorsal foot and to a lesser extent the left medial mid tibia. We left the compression wrap on all week for the amount of edema in his left leg is quite a bit better. We use silver alginate as the primary dressing 11/3; edema control is good. Left anterior lower leg left medial lower leg and the plantar first metatarsal head. The left anterior lower leg required debridement. Deep tissue culture I did of this wound showed MRSA I put him on 10 days of doxycycline which she will start today. We have him in compression wraps. He has a security card and [employer] however he is retiring on November 15. We will need to then get him into a better offloading boot for the left foot perhaps a total contact cast 11/10; edema control is quite good. Left anterior and left medial lower leg wounds in the setting of chronic venous insufficiency and lymphedema. He also has a substantial area over the left plantar first metatarsal head. 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 He has significant hypergranulation on the left plantar foot wound. The patient is a diabetic but he does not have significant PAD 11/17; edema control is quite good. Left anterior and left medial lower leg wounds look better. The really concerning area remains the area on the left plantar first metatarsal head. He has a rim of epithelialization. 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  Starting today with a forefoot off loader but . possibly a total contact cast. 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 Arrives with a very significant odor from I think all of the wounds on the left leg and the left foot. Because of this I did not put a total contact cast on him today but will could still consider this. His wife was having cataract surgery which is the reason he missed the appointment Objective Constitutional Sitting or standing Blood Pressure is within target range for patient.. Pulse regular and within target range for patient.Marland Kitchen Respirations regular, non-labored and within target range.. Temperature is normal and within the target range for the patient.Marland Kitchen Appears in no distress. Vitals Time Taken: 9:32 AM, Height: 74 in, Weight: 238 lbs, BMI: 30.6, Temperature: 98.7 F, Pulse: 65 bpm, Respiratory Rate: 17 breaths/min, Blood Pressure: 134/76 mmHg, Capillary Blood Glucose: 191 mg/dl. Cardiovascular Pedal pulses palpable. Edema control is adequate. General Notes: Wound exam; left anterior lower leg and left lateral lower leg both of these look smaller surprisingly healthy looking granulation. The area on the plantar left first metatarsal head also looks quite reasonable in terms of granulation. We use MolecuLight to look at the surface of this which was negative. Integumentary (Hair, Skin) Wound #18 status is Open. Original cause of wound was Gradually Appeared. The date acquired was: 08/23/2020. The wound has been in treatment 6 weeks. The wound is located on the Left,Plantar Metatarsal head first. The wound measures 2.9cm length x 3.7cm width x 0.2cm depth; 8.427cm^2 area and 1.685cm^3 volume. There is Fat Layer (Subcutaneous Tissue) exposed. There is no tunneling or undermining noted. There is a large amount of purulent drainage noted. Foul odor after  cleansing was noted. The wound margin is distinct with the outline attached to the wound base. There is large (67-100%) red granulation within the wound bed. There is no necrotic tissue within the wound bed. General Notes: callous and maceration present. Wound #19 status is Open. Original cause of wound was Gradually Appeared. The date acquired was: 08/23/2020. The wound has been in treatment 6 weeks. The wound is located on the Left,Anterior Lower Leg. The wound measures 4.3cm length x 3.4cm width x 0.2cm depth; 11.483cm^2 area and 2.297cm^3 volume. There is Fat Layer (Subcutaneous Tissue) exposed. There is no tunneling or undermining noted. There is a large amount of purulent drainage noted. Foul odor after cleansing was noted. The wound margin is distinct with the outline attached to the wound base. There is large (67-100%) red, pink granulation within the wound bed. There is a small (1-33%) amount of necrotic tissue within the wound bed including Adherent Slough. Wound #21 status is Open. Original cause of wound was Gradually Appeared. The date acquired was: 08/23/2020. The wound has been in treatment 6 weeks. The wound is located on the Left,Medial Lower Leg. The wound measures 0.7cm length x 0.4cm width x 0.4cm depth; 0.22cm^2 area and 0.088cm^3 volume. There is Fat Layer (Subcutaneous Tissue) exposed. There is no tunneling or undermining noted. There is a medium amount of purulent drainage noted. The wound margin is distinct with the outline attached to the wound base. There is large (67-100%) red, pink granulation within the wound bed. There is no necrotic tissue within the wound bed. 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 There was a Haematologist Compression Therapy Procedure by Rhae Hammock, RN. Post procedure Diagnosis Wound #18: Same as Pre-Procedure Wound #19 Pre-procedure diagnosis of Wound #19 is a Diabetic Wound/Ulcer of the Lower Extremity located on the Left,Anterior Lower Leg . There was a Haematologist Compression Therapy Procedure by Rhae Hammock, RN. Post procedure Diagnosis Wound #19: Same as Pre-Procedure Wound #21 Pre-procedure diagnosis of Wound #21 is a Diabetic Wound/Ulcer of the Lower Extremity located on the Left,Medial Lower Leg . There was a Haematologist Compression Therapy Procedure by Rhae Hammock, RN. Post procedure Diagnosis Wound #21: Same as Pre-Procedure Plan Follow-up Appointments: Return Appointment in 1 week. - Dr. Dellia Nims!!! Bathing/ Shower/ Hygiene: May shower with protection but do not get wound dressing(s) wet. - Use a cast protector so you can shower without getting your wrap(s) wet Edema Control - Lymphedema / SCD / Other: Elevate legs to the level of the heart or above for 30 minutes daily and/or when sitting, a frequency of: Avoid standing for long periods of time. Patient to wear own compression stockings every day. - on right leg; Moisturize legs daily. - Ammonium LACTATE to BLE every day. Off-Loading: Wedge shoe to: - left front foot offloader Services and Therapies ordered were: Venous Duplex Doppler - STAT r/o DVT Left Leg WOUND #18: - Metatarsal head first Wound Laterality: Plantar, Left Cleanser: Normal Saline 1 x Per Week/7 Days Discharge Instructions: Cleanse the wound with Normal Saline prior to applying a  clean dressing using gauze sponges, not tissue or cotton balls. Cleanser: Soap and Water 1 x Per Week/7 Days Discharge Instructions: May shower and wash wound with dial antibacterial soap and water prior to dressing change. Cleanser: Wound Cleanser 1 x Per Week/7 Days Discharge Instructions: Cleanse the wound with wound cleanser prior to applying a clean dressing using gauze sponges, not tissue or cotton balls. Peri-Wound Care: Triamcinolone 15 (g) 1 x Per Week/7 Days Discharge Instructions: Use triamcinolone 15 (g) as directed Peri-Wound Care: Zinc Oxide Ointment 30g tube 1 x Per Week/7 Days Discharge Instructions: Apply Zinc Oxide to periwound with each dressing change Peri-Wound Care: Sween Lotion (Moisturizing lotion) 1 x Per Week/7 Days Discharge Instructions: Apply moisturizing lotion as directed Prim Dressing: Hydrofera Blue Classic Foam, 4x4 in 1 x Per Week/7 Days ary Discharge Instructions: Moisten with saline prior to applying to wound bed Secondary Dressing: ABD Pad, 5x9 1 x Per Week/7 Days Discharge Instructions: Apply over primary dressing as directed. Secondary Dressing: Zetuvit Plus 4x8 in 1 x Per Week/7 Days Discharge Instructions: Apply over primary dressing as directed. Secondary Dressing: CarboFLEX Odor Control Dressing, 4x4 in 1 x Per Week/7 Days Discharge Instructions: Apply over primary dressing as directed. Com pression Wrap: Unnaboot w/Calamine, 4x10 (in/yd) 1 x Per Week/7 Days Discharge Instructions: Apply Unnaboot as directed. May also use Miliken CoFlex Calamine 2 layer compression system as alternative. WOUND #19: - Lower Leg Wound Laterality: Left, Anterior Cleanser: Normal Saline 1 x Per Week/7 Days Discharge Instructions: Cleanse the wound with Normal Saline prior to applying a clean dressing using gauze sponges, not tissue or cotton balls. Cleanser: Soap and Water 1 x Per Week/7 Days Discharge Instructions: May shower and wash wound with dial antibacterial  soap and water prior to dressing change. Cleanser: Wound Cleanser 1 x Per Week/7 Days Discharge  Instructions: Cleanse the wound with wound cleanser prior to applying a clean dressing using gauze sponges, not tissue or cotton balls. Peri-Wound Care: Triamcinolone 15 (g) 1 x Per Week/7 Days Discharge Instructions: Use triamcinolone 15 (g) as directed Peri-Wound Care: Zinc Oxide Ointment 30g tube 1 x Per Week/7 Days Discharge Instructions: Apply Zinc Oxide to periwound with each dressing change Peri-Wound Care: Sween Lotion (Moisturizing lotion) 1 x Per Week/7 Days Discharge Instructions: Apply moisturizing lotion as directed Prim Dressing: Hydrofera Blue Classic Foam, 4x4 in 1 x Per Week/7 Days ary Discharge Instructions: Moisten with saline prior to applying to wound bed Secondary Dressing: ABD Pad, 5x9 1 x Per Week/7 Days Discharge Instructions: Apply over primary dressing as directed. Secondary Dressing: Zetuvit Plus 4x8 in 1 x Per Week/7 Days Discharge Instructions: Apply over primary dressing as directed. Secondary Dressing: CarboFLEX Odor Control Dressing, 4x4 in 1 x Per Week/7 Days Discharge Instructions: Apply over primary dressing as directed. Com pression Wrap: Unnaboot w/Calamine, 4x10 (in/yd) 1 x Per Week/7 Days Discharge Instructions: Apply Unnaboot as directed. May also use Miliken CoFlex Calamine 2 layer compression system as alternative. WOUND #21: - Lower Leg Wound Laterality: Left, Medial Cleanser: Normal Saline 1 x Per Week/7 Days Discharge Instructions: Cleanse the wound with Normal Saline prior to applying a clean dressing using gauze sponges, not tissue or cotton balls. Cleanser: Soap and Water 1 x Per Week/7 Days Discharge Instructions: May shower and wash wound with dial antibacterial soap and water prior to dressing change. Cleanser: Wound Cleanser 1 x Per Week/7 Days Discharge Instructions: Cleanse the wound with wound cleanser prior to applying a clean dressing  using gauze sponges, not tissue or cotton balls. Peri-Wound Care: Triamcinolone 15 (g) 1 x Per Week/7 Days Discharge Instructions: Use triamcinolone 15 (g) as directed Peri-Wound Care: Zinc Oxide Ointment 30g tube 1 x Per Week/7 Days Discharge Instructions: Apply Zinc Oxide to periwound with each dressing change Peri-Wound Care: Sween Lotion (Moisturizing lotion) 1 x Per Week/7 Days Discharge Instructions: Apply moisturizing lotion as directed Prim Dressing: Hydrofera Blue Classic Foam, 4x4 in 1 x Per Week/7 Days ary Discharge Instructions: Moisten with saline prior to applying to wound bed Secondary Dressing: ABD Pad, 5x9 1 x Per Week/7 Days Discharge Instructions: Apply over primary dressing as directed. Secondary Dressing: Zetuvit Plus 4x8 in 1 x Per Week/7 Days Discharge Instructions: Apply over primary dressing as directed. Secondary Dressing: CarboFLEX Odor Control Dressing, 4x4 in 1 x Per Week/7 Days Discharge Instructions: Apply over primary dressing as directed. Compression Wrap: Unnaboot w/Calamine, 4x10 (in/yd) 1 x Per Week/7 Days Discharge Instructions: Apply Unnaboot as directed. May also use Miliken CoFlex Calamine 2 layer compression system as alternative. 1. We use MolecuLight on the foot wound which was a negative study 2. Still using Hydrofera Blue on all the wounds under compression 3. We will consider a total contact cast on a weekly basis ADDENDUM after I left the room the nurses found a swelling just posterior to the popliteal fossa on the left. We are uncertain how long this is been there it is nontender not discolored I am doubtful this is a hematoma which would be exquisitely painful. This may be a ruptured Baker's cyst the patient states that he has had a Baker's cyst in this area on previous venous imaging. I think he is going to need to repeat venous duplex ultrasound. We have no choice but to continue to wrap his leg as far as the lymphedema and the wounds are  concerned  MolecuLight DX: 1st Scanned Wound Fluorescence bacterial imaging was medically necessary today due to Initial Evaluation of the wound with MolecuLightDX to determine baseline (Indication): bacterial bioburden level There was no fluorescence suggestive of a high bacterial load on todays MolecuLight Results scan. As a result of todays scan, the following treatment plans were put in place. No additional antibiotics were required MolecuLight Procedure The MolecularLight DX device was cleaned with a disinfectant wipe prior to use., The correct patient profile was confirmed and correct wound was verified., Range finder sensor used to ensure appropriate distance selected The following was completed: between imaging unit and wound bed, Room lights were turned off and the ambient light sensor was checked., Blue circle appeared around the lightbulb., The fluorescence icon was selected. Screen was tapped to enhance focus and the image was captured. Additional drapes were used to ensure adequate darknesso No Additional Scanned Wounds Did you scan any additional Woundso No Electronic Signature(s) Signed: 05/25/2021 4:34:24 PM By: Linton Ham MD Entered By: Linton Ham on 05/25/2021 10:40:27 -------------------------------------------------------------------------------- SuperBill Details Patient Name: Date of Service: [name] 05/25/2021 Medical Record Number: [id number] Patient Account Number: [id number] Date of Birth/Sex: Treating RN: 08-Dec-1950 (69 y.o. 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 E11.42 Type 2 diabetes mellitus with diabetic polyneuropathy Facility Procedures CPT4 Code: [id number] Description: 99213 - WOUND CARE VISIT-LEV 3 EST PT Modifier: Quantity: 1 Physician Procedures : CPT4 Code Description Modifier [id number] - WC PHYS LEVEL 4 - EST PT ICD-10 Diagnosis Description E11.621 Type 2 diabetes mellitus with foot ulcer I89.0 Lymphedema, not elsewhere classified 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 of other part of left  foot with other specified severity 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verity Quantity: 1 Electronic Signature(s) Signed: 05/30/2021 3:55:46 PM By: Linton Ham MD Signed: 05/31/2021 4:34:42 PM By: Rhae Hammock RN Previous Signature: 05/25/2021 4:34:24 PM Version By: Linton Ham MD Entered By: Rhae Hammock on 05/30/2021 14:02:59

## 2021-05-25 NOTE — Progress Notes (Addendum)
[name], KOZIOL ([id number]) Visit Report for 05/25/2021 Arrival Information Details Patient Name: Date of Service: FARMER, MCCAHILL 05/25/2021 9:30 A M Medical Record Number: [id number] Patient Account Number: [id number] Date of Birth/Sex: Treating RN: 06/20/1951 (70 y.o. [name], [place] Primary Care Breslin Hemann: [name] Other Clinician: Referring Milta Croson: Treating Lennell Shanks/Extender: Stormy Card in Treatment: 6 Visit Information History Since Last Visit Added or deleted any medications: No Patient Arrived: Ambulatory Any new allergies or adverse reactions: No Arrival Time: 09:31 Had a fall or experienced change in No Accompanied By: self activities of daily living that may affect Transfer Assistance: Manual risk of falls: Patient Identification Verified: Yes Signs or symptoms of abuse/neglect since last visito No Secondary Verification Process Completed: Yes Hospitalized since last visit: No Patient Requires Transmission-Based Precautions: No Implantable device outside of the clinic excluding No Patient Has Alerts: Yes cellular tissue based products placed in the center Patient Alerts: ABI's: 09/22 L:1.09 R: 1. since last visit: TBI's: R:0.72 L:0.99 Has Dressing in Place as Prescribed: Yes Has Compression in Place as Prescribed: Yes Pain Present Now: No Electronic Signature(s) Signed: 05/25/2021 5:20:51 PM By: Rhae Hammock RN Entered By: Rhae Hammock on 05/25/2021 09:32:40 -------------------------------------------------------------------------------- Clinic Level of Care Assessment Details Patient Name: Date of Service: [name], MILLIRONS 05/25/2021 9:30 A M Medical Record Number: [id number] Patient Account Number: [id number] Date of Birth/Sex: Treating RN: [birth year] (70 y.o. [name], [place] Primary Care Aunesty Tyson: [name] Other Clinician: Referring Kenleigh Toback: Treating January Bergthold/Extender: Stormy Card in  Treatment: 6 Clinic Level of Care Assessment Items TOOL 4 Quantity Score X- 1 0 Use when only an EandM is performed on FOLLOW-UP visit ASSESSMENTS - Nursing Assessment / Reassessment X- 1 10 Reassessment of Co-morbidities (includes updates in patient status) X- 1 5 Reassessment of Adherence to Treatment Plan ASSESSMENTS - Wound and Skin A ssessment / Reassessment X - Simple Wound Assessment / Reassessment - one wound 1 5 []  - 0 Complex Wound Assessment / Reassessment - multiple wounds []  - 0 Dermatologic / Skin Assessment (not related to wound area) ASSESSMENTS - Focused Assessment []  - 0 Circumferential Edema Measurements - multi extremities []  - 0 Nutritional Assessment / Counseling / Intervention []  - 0 Lower Extremity Assessment (monofilament, tuning fork, pulses) []  - 0 Peripheral Arterial Disease Assessment (using hand held doppler) ASSESSMENTS - Ostomy and/or Continence Assessment and Care []  - 0 Incontinence Assessment and Management []  - 0 Ostomy Care Assessment and Management (repouching, etc.) PROCESS - Coordination of Care X - Simple Patient / Family Education for ongoing care 1 15 []  - 0 Complex (extensive) Patient / Family Education for ongoing care X- 1 10 Staff obtains Programmer, systems, Records, T Results / Process Orders est []  - 0 Staff telephones HHA, Nursing Homes / Clarify orders / etc []  - 0 Routine Transfer to another Facility (non-emergent condition) []  - 0 Routine Hospital Admission (non-emergent condition) []  - 0 New Admissions / Biomedical engineer / Ordering NPWT Apligraf, etc. , []  - 0 Emergency Hospital Admission (emergent condition) X- 1 10 Simple Discharge Coordination []  - 0 Complex (extensive) Discharge Coordination PROCESS - Special Needs []  - 0 Pediatric / Minor Patient Management []  - 0 Isolation Patient Management []  - 0 Hearing / Language / Visual special needs []  - 0 Assessment of Community assistance (transportation,  D/C planning, etc.) []  - 0 Additional assistance / Altered mentation []  - 0 Support Surface(s) Assessment (bed, cushion, seat, etc.) INTERVENTIONS - Wound Cleansing / Measurement X -  Simple Wound Cleansing - one wound 1 5 []  - 0 Complex Wound Cleansing - multiple wounds X- 1 5 Wound Imaging (photographs - any number of wounds) []  - 0 Wound Tracing (instead of photographs) X- 1 5 Simple Wound Measurement - one wound []  - 0 Complex Wound Measurement - multiple wounds INTERVENTIONS - Wound Dressings []  - 0 Small Wound Dressing one or multiple wounds X- 1 15 Medium Wound Dressing one or multiple wounds []  - 0 Large Wound Dressing one or multiple wounds []  - 0 Application of Medications - topical []  - 0 Application of Medications - injection INTERVENTIONS - Miscellaneous []  - 0 External ear exam []  - 0 Specimen Collection (cultures, biopsies, blood, body fluids, etc.) []  - 0 Specimen(s) / Culture(s) sent or taken to Lab for analysis []  - 0 Patient Transfer (multiple staff / Civil Service fast streamer / Similar devices) []  - 0 Simple Staple / Suture removal (25 or less) []  - 0 Complex Staple / Suture removal (26 or more) []  - 0 Hypo / Hyperglycemic Management (close monitor of Blood Glucose) []  - 0 Ankle / Brachial Index (ABI) - do not check if billed separately X- 1 5 Vital Signs Has the patient been seen at the hospital within the last three years: Yes Total Score: 90 Level Of Care: New/Established - Level 3 Electronic Signature(s) Signed: 05/31/2021 4:34:42 PM By: Rhae Hammock RN Entered By: Rhae Hammock on 05/30/2021 14:02:52 -------------------------------------------------------------------------------- Encounter Discharge Information Details Patient Name: Date of Service: [name]. 05/25/2021 9:30 A M Medical Record Number: [id number] Patient Account Number: [id number] Date of Birth/Sex: Treating RN: October 12, 1950 (70 y.o. [name], [name] Primary Care  Latanza Pfefferkorn: [name] Other Clinician: Referring Nora Rooke: Treating Lakechia Nay/Extender: Stormy Card in Treatment: 6 Encounter Discharge Information Items Discharge Condition: Stable Ambulatory Status: Ambulatory Discharge Destination: Home Transportation: Private Auto Accompanied By: self Schedule Follow-up Appointment: Yes Clinical Summary of Care: Patient Declined Electronic Signature(s) Signed: 05/31/2021 4:34:42 PM By: Rhae Hammock RN Entered By: Rhae Hammock on 05/30/2021 14:03:50 -------------------------------------------------------------------------------- Lower Extremity Assessment Details Patient Name: Date of Service: [name]. 05/25/2021 9:30 A M Medical Record Number: [id number] Patient Account Number: [id number] Date of Birth/Sex: Treating RN: 1951/03/01 (70 y.o. [name], [name] Primary Care Darryll Raju: [name] Other Clinician: Referring Erling Arrazola: Treating Brytani Voth/Extender: Stormy Card in Treatment: 6 Edema Assessment Assessed: [name]: Yes] [name]: No] Edema: [Left: Ye] [Right: s] Calf Left: Right: Point of Measurement: 41 cm From Medial Instep 46 cm Ankle Left: Right: Point of Measurement: 10 cm From Medial Instep 28 cm Vascular Assessment Pulses: Dorsalis Pedis Palpable: [Left:Yes] Posterior Tibial Palpable: [Left:Yes] Electronic Signature(s) Signed: 05/25/2021 5:20:51 PM By: Rhae Hammock RN Entered By: Rhae Hammock on 05/25/2021 09:34:50 -------------------------------------------------------------------------------- Multi Wound Chart Details Patient Name: Date of Service: [name]. 05/25/2021 9:30 A M Medical Record Number: [id number] Patient Account Number: [id number] Date of Birth/Sex: Treating RN: February 02, 1951 (70 y.o. [name] Primary Care Cagney Steenson: [name] Other Clinician: Referring Dannia Snook: Treating Sejla Marzano/Extender: Stormy Card in Treatment: 6 Vital Signs Height(in): 74 Capillary Blood Glucose(mg/dl): 191 Weight(lbs): 238 Pulse(bpm): 84 Body Mass Index(BMI): 31 Blood Pressure(mmHg): 134/76 Temperature(F): 98.7 Respiratory Rate(breaths/min): 17 Photos: Left, Plantar Metatarsal head first Left, Anterior Lower Leg Left, Medial Lower Leg Wound Location: Gradually Appeared Gradually Appeared Gradually Appeared Wounding Event: Diabetic Wound/Ulcer of the Lower Diabetic Wound/Ulcer of the Lower Diabetic Wound/Ulcer of the Lower Primary Etiology: Extremity Extremity Extremity Glaucoma, Sleep Apnea, Glaucoma, Sleep  Apnea, Glaucoma, Sleep Apnea, Comorbid History: Hypertension, Peripheral Arterial Hypertension, Peripheral Arterial Hypertension, Peripheral Arterial Disease, Peripheral Venous Disease, Disease, Peripheral Venous Disease, Disease, Peripheral Venous Disease, Type II Diabetes, Gout, Osteoarthritis, Type II Diabetes, Gout, Osteoarthritis, Type II Diabetes, Gout, Osteoarthritis, Neuropathy Neuropathy Neuropathy 08/23/2020 08/23/2020 08/23/2020 Date Acquired: 6 6 6  Weeks of Treatment: Open Open Open Wound Status: 2.9x3.7x0.2 4.3x3.4x0.2 0.7x0.4x0.4 Measurements L x W x D (cm) 8.427 11.483 0.22 A (cm) : rea 1.685 2.297 0.088 Volume (cm) : 40.40% 20.50% 92.50% % Reduction in A rea: 40.40% 47.00% 90.00% % Reduction in Volume: Grade 2 Grade 2 Grade 2 Classification: Large Large Medium Exudate A mount: Purulent Purulent Purulent Exudate Type: yellow, brown, green yellow, brown, green yellow, brown, green Exudate Color: Yes Yes N/A Foul Odor A Cleansing: fter No No N/A Odor A nticipated Due to Product Use: Distinct, outline attached Distinct, outline attached Distinct, outline attached Wound Margin: Large (67-100%) Large (67-100%) Large (67-100%) Granulation A mount: Red Red, Pink Red, Pink Granulation Quality: None Present (0%) Small (1-33%) None Present (0%) Necrotic A  mount: Fat Layer (Subcutaneous Tissue): Yes Fat Layer (Subcutaneous Tissue): Yes Fat Layer (Subcutaneous Tissue): Yes Exposed Structures: Fascia: No Fascia: No Fascia: No Tendon: No Tendon: No Tendon: No Muscle: No Muscle: No Muscle: No Joint: No Joint: No Joint: No Bone: No Bone: No Bone: No Small (1-33%) Small (1-33%) Medium (34-66%) Epithelialization: callous and maceration present. N/A N/A Assessment Notes: Treatment Notes Electronic Signature(s) Signed: 05/25/2021 4:34:24 PM By: Linton Ham MD Signed: 05/25/2021 5:22:04 PM By: Deon Pilling RN, BSN Entered By: Linton Ham on 05/25/2021 10:10:53 -------------------------------------------------------------------------------- Multi-Disciplinary Care Plan Details Patient Name: Date of Service: [name]. 05/25/2021 9:30 A M Medical Record Number: [id number] Patient Account Number: [id number] Date of Birth/Sex: Treating RN: 1951-03-26 (70 y.o. [name], [name] Primary Care Delron Comer: [name] Other Clinician: Referring Moses Odoherty: Treating Hani Patnode/Extender: Stormy Card in Treatment: 6 Multidisciplinary Care Plan reviewed with physician Active Inactive Soft Tissue Infection Nursing Diagnoses: Impaired tissue integrity Potential for infection: soft tissue Goals: Patient's soft tissue infection will resolve Date Initiated: 04/20/2021 Target Resolution Date: 06/09/2021 Goal Status: Active Interventions: Assess signs and symptoms of infection every visit Provide education on infection Treatment Activities: Culture : 04/20/2021 Education provided on Infection : 05/11/2021 Systemic antibiotics : 04/20/2021 T ordered outside of clinic : 04/20/2021 est Notes: Wound/Skin Impairment Nursing Diagnoses: Impaired tissue integrity Knowledge deficit related to ulceration/compromised skin integrity Goals: Patient will have a decrease in wound volume by X% from date: (specify in  notes) Date Initiated: 04/12/2021 Date Inactivated: 04/27/2021 Target Resolution Date: 04/23/2021 Goal Status: Met Patient/caregiver will verbalize understanding of skin care regimen Date Initiated: 04/12/2021 Target Resolution Date: 05/25/2021 Goal Status: Active Ulcer/skin breakdown will have a volume reduction of 30% by week 4 Date Initiated: 04/12/2021 Date Inactivated: 04/27/2021 Target Resolution Date: 04/27/2021 Goal Status: Unmet Unmet Reason: infection Ulcer/skin breakdown will have a volume reduction of 50% by week 8 Date Initiated: 04/27/2021 Target Resolution Date: 05/25/2021 Goal Status: Active Interventions: Assess patient/caregiver ability to obtain necessary supplies Assess patient/caregiver ability to perform ulcer/skin care regimen upon admission and as needed Assess ulceration(s) every visit Notes: Electronic Signature(s) Signed: 05/25/2021 5:20:51 PM By: Rhae Hammock RN Entered By: Rhae Hammock on 05/25/2021 10:13:46 -------------------------------------------------------------------------------- Pain Assessment Details Patient Name: Date of Service: [name]. 05/25/2021 9:30 A M Medical Record Number: [id number] Patient Account Number: [id number] Date of Birth/Sex: Treating RN: Oct 24, 1950 (70 y.o. [name] Primary Care Payton Moder:  [name] Other Clinician: Referring Shantanu Strauch: Treating Kristi Norment/Extender: Stormy Card in Treatment: 6 Active Problems Location of Pain Severity and Description of Pain Patient Has Paino No Site Locations Pain Management and Medication Current Pain Management: Electronic Signature(s) Signed: 05/25/2021 5:20:51 PM By: Rhae Hammock RN Entered By: Rhae Hammock on 05/25/2021 09:34:07 -------------------------------------------------------------------------------- Patient/Caregiver Education Details Patient Name: Date of Service: [name] 12/1/2022andnbsp9:30 A  M Medical Record Number: [id number] Patient Account Number: [id number] Date of Birth/Gender: Treating RN: 1951/03/16 (70 y.o. [name] Primary Care Physician: [name] Other Clinician: Referring Physician: Treating Physician/Extender: Stormy Card in Treatment: 6 Education Assessment Education Provided To: Patient Education Topics Provided Wound/Skin Impairment: Methods: Explain/Verbal Responses: State content correctly Electronic Signature(s) Signed: 05/25/2021 5:20:51 PM By: Rhae Hammock RN Entered By: Rhae Hammock on 05/25/2021 10:14:07 -------------------------------------------------------------------------------- Wound Assessment Details Patient Name: Date of Service: [name]. 05/25/2021 9:30 A M Medical Record Number: [id number] Patient Account Number: [id number] Date of Birth/Sex: Treating RN: September 14, 1950 (70 y.o. [name], [name] Primary Care Vicke Plotner: [name] Other Clinician: Referring Luticia Tadros: Treating Khyler Urda/Extender: Stormy Card in Treatment: 6 Wound Status Wound Number: 18 Primary Diabetic Wound/Ulcer of the Lower Extremity Etiology: Wound Location: Left, Plantar Metatarsal head first Wound Open Wounding Event: Gradually Appeared Status: Date Acquired: 08/23/2020 Comorbid Glaucoma, Sleep Apnea, Hypertension, Peripheral Arterial Disease, Weeks Of Treatment: 6 History: Peripheral Venous Disease, Type II Diabetes, Gout, Osteoarthritis, Clustered Wound: No Neuropathy Photos Wound Measurements Length: (cm) 2.9 Width: (cm) 3.7 Depth: (cm) 0.2 Area: (cm) 8.427 Volume: (cm) 1.685 % Reduction in Area: 40.4% % Reduction in Volume: 40.4% Epithelialization: Small (1-33%) Tunneling: No Undermining: No Wound Description Classification: Grade 2 Wound Margin: Distinct, outline attached Exudate Amount: Large Exudate Type: Purulent Exudate Color: yellow, brown, green Wound  Bed Granulation Amount: Large (67-100%) Granulation Quality: Red Necrotic Amount: None Present (0%) Foul Odor After Cleansing: Yes Due to Product Use: No Slough/Fibrino Yes Exposed Structure Fascia Exposed: No Fat Layer (Subcutaneous Tissue) Exposed: Yes Tendon Exposed: No Muscle Exposed: No Joint Exposed: No Bone Exposed: No Assessment Notes callous and maceration present. Electronic Signature(s) Signed: 05/25/2021 5:20:51 PM By: Rhae Hammock RN Entered By: Rhae Hammock on 05/25/2021 17:11:24 -------------------------------------------------------------------------------- Wound Assessment Details Patient Name: Date of Service: [name]. 05/25/2021 9:30 A M Medical Record Number: [id number] Patient Account Number: [id number] Date of Birth/Sex: Treating RN: 10/24/1950 (70 y.o. [name], [name] Primary Care Eduardo Honor: [name] Other Clinician: Referring Bayla Mcgovern: Treating Kambry Takacs/Extender: Stormy Card in Treatment: 6 Wound Status Wound Number: 19 Primary Diabetic Wound/Ulcer of the Lower Extremity Etiology: Wound Location: Left, Anterior Lower Leg Wound Open Wounding Event: Gradually Appeared Status: Date Acquired: 08/23/2020 Comorbid Glaucoma, Sleep Apnea, Hypertension, Peripheral Arterial Disease, Weeks Of Treatment: 6 History: Peripheral Venous Disease, Type II Diabetes, Gout, Osteoarthritis, Clustered Wound: No Neuropathy Photos Wound Measurements Length: (cm) 4.3 Width: (cm) 3.4 Depth: (cm) 0.2 Area: (cm) 11.483 Volume: (cm) 2.297 % Reduction in Area: 20.5% % Reduction in Volume: 47% Epithelialization: Small (1-33%) Tunneling: No Undermining: No Wound Description Classification: Grade 2 Wound Margin: Distinct, outline attached Exudate Amount: Large Exudate Type: Purulent Exudate Color: yellow, brown, green Foul Odor After Cleansing: Yes Due to Product Use: No Slough/Fibrino Yes Wound Bed Granulation  Amount: Large (67-100%) Exposed Structure Granulation Quality: Red, Pink Fascia Exposed: No Necrotic Amount: Small (1-33%) Fat Layer (Subcutaneous Tissue) Exposed: Yes Necrotic Quality: Adherent Slough Tendon Exposed: No Muscle Exposed: No Joint Exposed: No  Bone Exposed: No Electronic Signature(s) Signed: 05/25/2021 5:20:51 PM By: Rhae Hammock RN Entered By: Rhae Hammock on 05/25/2021 09:50:43 -------------------------------------------------------------------------------- Wound Assessment Details Patient Name: Date of Service: [name]. 05/25/2021 9:30 A M Medical Record Number: [id number] Patient Account Number: [id number] Date of Birth/Sex: Treating RN: 1950/12/04 (70 y.o. [name], [place] Primary Care Rilyn Upshaw: [name] Other Clinician: Referring Landyn Buckalew: Treating Sinai Mahany/Extender: Stormy Card in Treatment: 6 Wound Status Wound Number: 21 Primary Diabetic Wound/Ulcer of the Lower Extremity Etiology: Wound Location: Left, Medial Lower Leg Wound Open Wounding Event: Gradually Appeared Status: Date Acquired: 08/23/2020 Comorbid Glaucoma, Sleep Apnea, Hypertension, Peripheral Arterial Disease, Weeks Of Treatment: 6 History: Peripheral Venous Disease, Type II Diabetes, Gout, Osteoarthritis, Clustered Wound: No Neuropathy Photos Wound Measurements Length: (cm) 0.7 Width: (cm) 0.4 Depth: (cm) 0.4 Area: (cm) 0.22 Volume: (cm) 0.088 % Reduction in Area: 92.5% % Reduction in Volume: 90% Epithelialization: Medium (34-66%) Tunneling: No Undermining: No Wound Description Classification: Grade 2 Wound Margin: Distinct, outline attached Exudate Amount: Medium Exudate Type: Purulent Exudate Color: yellow, brown, green Wound Bed Granulation Amount: Large (67-100%) Exposed Structure Granulation Quality: Red, Pink Fascia Exposed: No Necrotic Amount: None Present (0%) Fat Layer (Subcutaneous Tissue) Exposed: Yes Tendon  Exposed: No Muscle Exposed: No Joint Exposed: No Bone Exposed: No Electronic Signature(s) Signed: 05/25/2021 5:20:51 PM By: Rhae Hammock RN Entered By: Rhae Hammock on 05/25/2021 09:51:36 -------------------------------------------------------------------------------- Vitals Details Patient Name: Date of Service: [name]. 05/25/2021 9:30 A M Medical Record Number: [id number] Patient Account Number: [id number] Date of Birth/Sex: Treating RN: 04/21/1951 (70 y.o. [name], [name] Primary Care Kameka Whan: [name] Other Clinician: Referring Dalal Livengood: Treating Babbette Dalesandro/Extender: Stormy Card in Treatment: 6 Vital Signs Time Taken: 09:32 Temperature (F): 98.7 Height (in): 74 Pulse (bpm): 65 Weight (lbs): 238 Respiratory Rate (breaths/min): 17 Body Mass Index (BMI): 30.6 Blood Pressure (mmHg): 134/76 Capillary Blood Glucose (mg/dl): 191 Reference Range: 80 - 120 mg / dl Electronic Signature(s) Signed: 05/25/2021 5:20:51 PM By: Rhae Hammock RN Entered By: Rhae Hammock on 05/25/2021 09:33:10

## 2021-05-25 NOTE — Progress Notes (Signed)
[name], [name] ([id number]) Visit Report for 05/25/2021 Physician Orders Details Patient Name: Date of Service: ADVIT, TRETHEWEY 05/25/2021 3:30 PM Medical Record Number: [id number] Patient Account Number: [id number] Date of Birth/Sex: Treating RN: 11/25/1950 (70 y.o. Male) Rhae Hammock Primary Care Provider: [name] Other Clinician: Referring Provider: Treating Provider/Extender: Stormy Card in Treatment: 6 Verbal / Phone Orders: Yes Clinician: Rhae Hammock Read Back and Verified: No Diagnosis Coding Wound Treatment [facility] Ultrasound w/ possible aspiration - STAT Ultrasound of Left Leg w/ possible aspiration r/t possible abscess/hematoma. Pt. has increased swelling/redness/warmth of left upper calf area Electronic Signature(s) Signed: 05/25/2021 4:34:24 PM By: Linton Ham MD Signed: 05/25/2021 5:20:51 PM By: Rhae Hammock RN Entered By: Rhae Hammock on 05/25/2021 15:24:48 Prescription 05/25/2021 -------------------------------------------------------------------------------- Gerlean Ren MD Patient Name: Provider: 01-28-1951 [id number] Date of Birth: NPI#: Male [id number] Sex: DEA #: [phone] [id number] Phone #: License #: [place] Patient Address: [address] [place], [place] [address] [address], [place] [address] [id number] Allergies No Known Drug Allergies Provider's Orders Ultrasound w/ possible aspiration - STAT Ultrasound of Left Leg w/ possible aspiration r/t possible abscess/hematoma. Pt. has increased swelling/redness/warmth of left upper calf area Hand Signature: Date(s): Electronic Signature(s) Signed: 05/25/2021 4:34:24 PM By: Linton Ham MD Signed: 05/25/2021 5:20:51 PM By: Rhae Hammock RN Entered By: Rhae Hammock on 05/25/2021 15:24:49

## 2021-05-25 NOTE — Progress Notes (Addendum)
KENAI, [name] ([id number]) Visit Report for 05/25/2021 Arrival Information Details Patient Name: Date of Service: [name], [name] 05/25/2021 3:30 PM Medical Record Number: [id number] Patient Account Number: [id number] Date of Birth/Sex: Treating RN: [birth year] (70 y.o. [name], [name] Primary Care Amaryllis Malmquist: [name] Other Clinician: Referring Errin Whitelaw: Treating Camilia Caywood/Extender: Stormy Card in Treatment: 6 Visit Information History Since Last Visit Added or deleted any medications: No Patient Arrived: Ambulatory Any new allergies or adverse reactions: No Arrival Time: 15:10 Had a fall or experienced change in No Accompanied By: self activities of daily living that may affect Transfer Assistance: None risk of falls: Patient Identification Verified: Yes Signs or symptoms of abuse/neglect since last visito No Secondary Verification Process Completed: Yes Hospitalized since last visit: No Patient Requires Transmission-Based Precautions: No Implantable device outside of the clinic excluding No Patient Has Alerts: Yes cellular tissue based products placed in the center Patient Alerts: ABI's: 09/22 L:1.09 R: 1. since last visit: TBI's: R:0.72 L:0.99 Has Dressing in Place as Prescribed: Yes Pain Present Now: No Electronic Signature(s) Signed: 05/25/2021 3:12:15 PM By: Sandre Kitty Entered By: Sandre Kitty on 05/25/2021 15:11:12 -------------------------------------------------------------------------------- Compression Therapy Details Patient Name: Date of Service: [name]. 05/25/2021 3:30 PM Medical Record Number: [id number] Patient Account Number: [id number] Date of Birth/Sex: Treating RN: Dec 05, 1950 (70 y.o. [name], [name] Primary Care Tiasia Weberg: [name] Other Clinician: Referring Josee Speece: Treating Pang Robers/Extender: Stormy Card in Treatment: 6 Compression Therapy Performed for Wound Assessment:  Wound #18 Left,Plantar Metatarsal head first Performed By: Clinician Rhae Hammock, RN Compression Type: Rolena Infante Electronic Signature(s) Signed: 05/25/2021 5:20:51 PM By: Rhae Hammock RN Entered By: Rhae Hammock on 05/25/2021 16:51:41 -------------------------------------------------------------------------------- Compression Therapy Details Patient Name: Date of Service: [name]. 05/25/2021 3:30 PM Medical Record Number: [id number] Patient Account Number: [id number] Date of Birth/Sex: Treating RN: September 26, 1950 (70 y.o. [name] Primary Care Lilyanne Mcquown: Other Clinician: [name] Referring Latiya Navia: Treating Shawntrice Salle/Extender: Stormy Card in Treatment: 6 Compression Therapy Performed for Wound Assessment: Wound #19 Left,Anterior Lower Leg Performed By: Clinician Rhae Hammock, RN Compression Type: Rolena Infante Electronic Signature(s) Signed: 05/25/2021 5:20:51 PM By: Rhae Hammock RN Entered By: Rhae Hammock on 05/25/2021 16:51:41 -------------------------------------------------------------------------------- Compression Therapy Details Patient Name: Date of Service: [name]. 05/25/2021 3:30 PM Medical Record Number: [id number] Patient Account Number: [id number] Date of Birth/Sex: Treating RN: Aug 27, 1950 (70 y.o. [name], [name] Primary Care Milton Streicher: [name] Other Clinician: Referring Ladonya Jerkins: Treating Thaila Bottoms/Extender: Stormy Card in Treatment: 6 Compression Therapy Performed for Wound Assessment: Wound #21 Left,Medial Lower Leg Performed By: Clinician Rhae Hammock, RN Compression Type: Rolena Infante Electronic Signature(s) Signed: 05/25/2021 5:20:51 PM By: Rhae Hammock RN Entered By: Rhae Hammock on 05/25/2021 16:51:41 -------------------------------------------------------------------------------- Encounter Discharge Information Details Patient  Name: Date of Service: [name]. 05/25/2021 3:30 PM Medical Record Number: [id number] Patient Account Number: [id number] Date of Birth/Sex: Treating RN: 10/24/1950 (70 y.o. [name] Primary Care Ahria Slappey: [name] Other Clinician: Referring Jaston Havens: Treating Deondrea Markos/Extender: Stormy Card in Treatment: 6 Encounter Discharge Information Items Discharge Condition: Stable Ambulatory Status: Ambulatory Discharge Destination: Home Transportation: Private Auto Accompanied By: self Schedule Follow-up Appointment: Yes Clinical Summary of Care: Electronic Signature(s) Signed: 07/13/2021 12:16:43 PM By: Deon Pilling RN, BSN Entered By: Deon Pilling on 07/13/2021 12:16:15 -------------------------------------------------------------------------------- Patient/Caregiver Education Details Patient Name: Date of Service: [name] 12/1/2022andnbsp3:30 PM Medical Record Number: [id number] Patient Account Number: [id number]  Date of Birth/Gender: Treating RN: [birth year] (70 y.o. [name] Primary Care Physician: [name] Other Clinician: Referring Physician: Treating Physician/Extender: Stormy Card in Treatment: 6 Education Assessment Education Provided To: Patient Education Topics Provided Wound/Skin Impairment: Handouts: Skin Care Do's and Dont's Methods: Explain/Verbal Responses: Reinforcements needed Electronic Signature(s) Signed: 07/13/2021 12:16:43 PM By: Deon Pilling RN, BSN Entered By: Deon Pilling on 07/13/2021 12:16:04 -------------------------------------------------------------------------------- Wound Assessment Details Patient Name: Date of Service: [name]. 05/25/2021 3:30 PM Medical Record Number: [id number] Patient Account Number: [id number] Date of Birth/Sex: Treating RN: 11-20-1950 (70 y.o. [name], [name] Primary Care Danyal Adorno: [name] Other Clinician: Referring  Nadeem Romanoski: Treating Collen Vincent/Extender: Stormy Card in Treatment: 6 Wound Status Wound Number: 18 Primary Etiology: Diabetic Wound/Ulcer of the Lower Extremity Wound Location: Left, Plantar Metatarsal head first Wound Status: Open Wounding Event: Gradually Appeared Date Acquired: 08/23/2020 Weeks Of Treatment: 6 Clustered Wound: No Wound Measurements Length: (cm) 2.9 Width: (cm) 3.7 Depth: (cm) 0.2 Area: (cm) 8.427 Volume: (cm) 1.685 % Reduction in Area: 40.4% % Reduction in Volume: 40.4% Wound Description Classification: Grade 2 Exudate Amount: Large Exudate Type: Purulent Exudate Color: yellow, brown, green Electronic Signature(s) Signed: 05/25/2021 3:12:15 PM By: Sandre Kitty Signed: 05/25/2021 5:22:04 PM By: Deon Pilling RN, BSN Entered By: Sandre Kitty on 05/25/2021 15:11:47 -------------------------------------------------------------------------------- Wound Assessment Details Patient Name: Date of Service: [name]. 05/25/2021 3:30 PM Medical Record Number: [id number] Patient Account Number: [id number] Date of Birth/Sex: Treating RN: 11-22-1950 (70 y.o. [name], [name] Primary Care Aryn Kops: [name] Other Clinician: Referring Juston Goheen: Treating Magenta Schmiesing/Extender: Stormy Card in Treatment: 6 Wound Status Wound Number: 19 Primary Etiology: Diabetic Wound/Ulcer of the Lower Extremity Wound Location: Left, Anterior Lower Leg Wound Status: Open Wounding Event: Gradually Appeared Date Acquired: 08/23/2020 Weeks Of Treatment: 6 Clustered Wound: No Wound Measurements Length: (cm) 4.3 Width: (cm) 3.4 Depth: (cm) 0.2 Area: (cm) 11.483 Volume: (cm) 2.297 % Reduction in Area: 20.5% % Reduction in Volume: 47% Wound Description Classification: Grade 2 Exudate Amount: Large Exudate Type: Purulent Exudate Color: yellow, brown, green Electronic Signature(s) Signed: 05/25/2021 3:12:15 PM By: Sandre Kitty Signed: 05/25/2021 5:22:04 PM By: Deon Pilling RN, BSN Entered By: Sandre Kitty on 05/25/2021 15:11:48 -------------------------------------------------------------------------------- Wound Assessment Details Patient Name: Date of Service: [name]. 05/25/2021 3:30 PM Medical Record Number: [id number] Patient Account Number: [id number] Date of Birth/Sex: Treating RN: 1950/10/16 (70 y.o. [name], [name] Primary Care Jadin Kagel: [name] Other Clinician: Referring Kaidence Callaway: Treating Carisma Troupe/Extender: Stormy Card in Treatment: 6 Wound Status Wound Number: 21 Primary Etiology: Diabetic Wound/Ulcer of the Lower Extremity Wound Location: Left, Medial Lower Leg Wound Status: Open Wounding Event: Gradually Appeared Date Acquired: 08/23/2020 Weeks Of Treatment: 6 Clustered Wound: No Wound Measurements Length: (cm) 0.7 Width: (cm) 0.4 Depth: (cm) 0.4 Area: (cm) 0.22 Volume: (cm) 0.088 % Reduction in Area: 92.5% % Reduction in Volume: 90% Wound Description Classification: Grade 2 Exudate Amount: Medium Exudate Type: Purulent Exudate Color: yellow, brown, green Electronic Signature(s) Signed: 05/25/2021 3:12:15 PM By: Sandre Kitty Signed: 05/25/2021 5:22:04 PM By: Deon Pilling RN, BSN Entered By: Sandre Kitty on 05/25/2021 15:11:48 -------------------------------------------------------------------------------- Vitals Details Patient Name: Date of Service: [name]. 05/25/2021 3:30 PM Medical Record Number: [id number] Patient Account Number: [id number] Date of Birth/Sex: Treating RN: [birth year] (70 y.o. [name] Primary Care Tamanna Whitson: [name] Other Clinician: Referring Ava Tangney: Treating Ewald Beg/Extender: Stormy Card in Treatment: 6  Vital Signs Time Taken: 15:11 Temperature (F): 98.7 Height (in): 74 Pulse (bpm): 65 Weight (lbs): 238 Respiratory Rate (breaths/min): 17 Body  Mass Index (BMI): 30.6 Blood Pressure (mmHg): 134/76 Capillary Blood Glucose (mg/dl): 191 Reference Range: 80 - 120 mg / dl Electronic Signature(s) Signed: 05/25/2021 3:12:15 PM By: Sandre Kitty Entered By: Sandre Kitty on 05/25/2021 15:11:28

## 2021-05-26 ENCOUNTER — Other Ambulatory Visit (HOSPITAL_COMMUNITY): Payer: Self-pay | Admitting: Internal Medicine

## 2021-05-26 ENCOUNTER — Other Ambulatory Visit: Payer: Self-pay | Admitting: Internal Medicine

## 2021-05-26 DIAGNOSIS — L0291 Cutaneous abscess, unspecified: Secondary | ICD-10-CM

## 2021-05-29 ENCOUNTER — Encounter (HOSPITAL_BASED_OUTPATIENT_CLINIC_OR_DEPARTMENT_OTHER): Payer: BC Managed Care – PPO | Admitting: Internal Medicine

## 2021-05-29 ENCOUNTER — Other Ambulatory Visit: Payer: Self-pay

## 2021-05-29 ENCOUNTER — Other Ambulatory Visit: Payer: Self-pay | Admitting: Internal Medicine

## 2021-05-29 ENCOUNTER — Ambulatory Visit (HOSPITAL_COMMUNITY)
Admission: RE | Admit: 2021-05-29 | Discharge: 2021-05-29 | Disposition: A | Discharge status: Home / Self Care | Payer: BC Managed Care – PPO | Source: Ambulatory Visit | Attending: Internal Medicine | Admitting: Internal Medicine

## 2021-05-29 ENCOUNTER — Other Ambulatory Visit (HOSPITAL_COMMUNITY): Payer: Self-pay | Admitting: Internal Medicine

## 2021-05-29 DIAGNOSIS — E11621 Type 2 diabetes mellitus with foot ulcer: Secondary | ICD-10-CM | POA: Diagnosis not present

## 2021-05-29 DIAGNOSIS — L02419 Cutaneous abscess of limb, unspecified: Secondary | ICD-10-CM

## 2021-05-29 DIAGNOSIS — L0291 Cutaneous abscess, unspecified: Secondary | ICD-10-CM | POA: Diagnosis not present

## 2021-05-30 ENCOUNTER — Other Ambulatory Visit (HOSPITAL_COMMUNITY)
Admission: RE | Admit: 2021-05-30 | Discharge: 2021-05-30 | Disposition: A | Discharge status: Home / Self Care | Payer: BC Managed Care – PPO | Source: Ambulatory Visit | Attending: Internal Medicine | Admitting: Internal Medicine

## 2021-05-30 ENCOUNTER — Encounter (HOSPITAL_BASED_OUTPATIENT_CLINIC_OR_DEPARTMENT_OTHER): Payer: BC Managed Care – PPO | Admitting: Internal Medicine

## 2021-05-30 DIAGNOSIS — I890 Lymphedema, not elsewhere classified: Secondary | ICD-10-CM | POA: Insufficient documentation

## 2021-05-30 DIAGNOSIS — L97828 Non-pressure chronic ulcer of other part of left lower leg with other specified severity: Secondary | ICD-10-CM | POA: Diagnosis not present

## 2021-05-30 DIAGNOSIS — E11621 Type 2 diabetes mellitus with foot ulcer: Secondary | ICD-10-CM | POA: Diagnosis not present

## 2021-05-30 DIAGNOSIS — E1142 Type 2 diabetes mellitus with diabetic polyneuropathy: Secondary | ICD-10-CM | POA: Diagnosis not present

## 2021-05-30 DIAGNOSIS — L97528 Non-pressure chronic ulcer of other part of left foot with other specified severity: Secondary | ICD-10-CM | POA: Insufficient documentation

## 2021-05-30 DIAGNOSIS — I87322 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: Secondary | ICD-10-CM | POA: Diagnosis not present

## 2021-05-30 DIAGNOSIS — E1151 Type 2 diabetes mellitus with diabetic peripheral angiopathy without gangrene: Secondary | ICD-10-CM | POA: Insufficient documentation

## 2021-05-30 LAB — CBC WITH DIFFERENTIAL/PLATELET
Abs Immature Granulocytes: 0.04 10*3/uL (ref 0.00–0.07)
Basophils Absolute: 0 10*3/uL (ref 0.0–0.1)
Basophils Relative: 0 %
Eosinophils Absolute: 0.5 10*3/uL (ref 0.0–0.5)
Eosinophils Relative: 5 %
HCT: 33.8 % — ABNORMAL LOW (ref 39.0–52.0)
Hemoglobin: 10.6 g/dL — ABNORMAL LOW (ref 13.0–17.0)
Immature Granulocytes: 0 %
Lymphocytes Relative: 18 %
Lymphs Abs: 1.6 10*3/uL (ref 0.7–4.0)
MCH: 24 pg — ABNORMAL LOW (ref 26.0–34.0)
MCHC: 31.4 g/dL (ref 30.0–36.0)
MCV: 76.5 fL — ABNORMAL LOW (ref 80.0–100.0)
Monocytes Absolute: 0.9 10*3/uL (ref 0.1–1.0)
Monocytes Relative: 10 %
Neutro Abs: 6.2 10*3/uL (ref 1.7–7.7)
Neutrophils Relative %: 67 %
Platelets: 278 10*3/uL (ref 150–400)
RBC: 4.42 MIL/uL (ref 4.22–5.81)
RDW: 20 % — ABNORMAL HIGH (ref 11.5–15.5)
WBC: 9.3 10*3/uL (ref 4.0–10.5)
nRBC: 0 % (ref 0.0–0.2)

## 2021-05-30 NOTE — Progress Notes (Signed)
AREEB, CORRON ([id number]) Visit Report for 05/29/2021 Arrival Information Details Patient Name: Date of Service: [name], [name] 05/29/2021 11:00 A M Medical Record Number: [id number] Patient Account Number: [id number] Date of Birth/Sex: Treating RN: [birth year] (70 y.o. [name] Primary Care Hindy Perrault: [name] Other Clinician: Referring Venson Ferencz: Treating Mikki Ziff/Extender: Cornelious Bryant in Treatment: 6 Visit Information History Since Last Visit Added or deleted any medications: No Patient Arrived: Ambulatory Any new allergies or adverse reactions: No Arrival Time: 11:13 Had a fall or experienced change in No Transfer Assistance: None activities of daily living that may affect Patient Identification Verified: Yes risk of falls: Secondary Verification Process Completed: Yes Signs or symptoms of abuse/neglect since No Patient Requires Transmission-Based Precautions: No last visito Patient Has Alerts: Yes Hospitalized since last visit: No Patient Alerts: ABI's: 09/22 L:1.09 R: 1. Implantable device outside of the clinic No TBI's: R:0.72 L:0.99 excluding cellular tissue based products placed in the center since last visit: Has Dressing in Place as Prescribed: Yes Has Compression in Place as Prescribed: Yes Has Footwear/Offloading in Place as Yes Prescribed: Left: Surgical Shoe with Pressure Relief Insole Pain Present Now: No Electronic Signature(s) Signed: 05/30/2021 3:40:48 PM By: Lorrin Jackson Entered By: Lorrin Jackson on 05/29/2021 11:15:23 -------------------------------------------------------------------------------- Compression Therapy Details Patient Name: Date of Service: [name] 05/29/2021 11:00 A M Medical Record Number: [id number] Patient Account Number: [id number] Date of Birth/Sex: Treating RN: Mar 11, 1951 (70 y.o. [name] Primary Care Starlit Raburn: [name] Other Clinician: Referring Ayaana Biondo: Treating  Elisabella Hacker/Extender: Cornelious Bryant in Treatment: 6 Compression Therapy Performed for Wound Assessment: Wound #18 Left,Plantar Metatarsal head first Performed By: Clinician Lorrin Jackson, RN Compression Type: Rolena Infante Electronic Signature(s) Signed: 05/29/2021 11:43:22 AM By: Lorrin Jackson Entered By: Lorrin Jackson on 05/29/2021 11:43:21 -------------------------------------------------------------------------------- Compression Therapy Details Patient Name: Date of Service: [name] 05/29/2021 11:00 A M Medical Record Number: [id number] Patient Account Number: [id number] Date of Birth/Sex: Treating RN: 1950-08-17 (70 y.o. [name] Primary Care Orville Widmann: [name] Other Clinician: Referring Elye Harmsen: Treating Jailynn Lavalais/Extender: Cornelious Bryant in Treatment: 6 Compression Therapy Performed for Wound Assessment: Wound #19 Left,Anterior Lower Leg Performed By: Clinician Lorrin Jackson, RN Compression Type: Rolena Infante Electronic Signature(s) Signed: 05/29/2021 11:43:22 AM By: Lorrin Jackson Entered By: Lorrin Jackson on 05/29/2021 11:43:22 -------------------------------------------------------------------------------- Compression Therapy Details Patient Name: Date of Service: [name] 05/29/2021 11:00 A M Medical Record Number: [id number] Patient Account Number: [id number] Date of Birth/Sex: Treating RN: April 09, 1951 (70 y.o. [name] Primary Care Aleksis Jiggetts: [name] Other Clinician: Referring Brianah Hopson: Treating Lacheryl Niesen/Extender: Cornelious Bryant in Treatment: 6 Compression Therapy Performed for Wound Assessment: Wound #21 Left,Medial Lower Leg Performed By: Clinician Lorrin Jackson, RN Compression Type: Rolena Infante Electronic Signature(s) Signed: 05/29/2021 11:43:22 AM By: Lorrin Jackson Entered By: Lorrin Jackson on 05/29/2021  11:43:22 -------------------------------------------------------------------------------- Encounter Discharge Information Details Patient Name: Date of Service: [name] 05/29/2021 11:00 A M Medical Record Number: [id number] Patient Account Number: [id number] Date of Birth/Sex: Treating RN: 27-Oct-1950 (70 y.o. [name] Primary Care Teletha Petrea: [name] Other Clinician: Referring Fannie Gathright: Treating Lilac Hoff/Extender: Cornelious Bryant in Treatment: 6 Encounter Discharge Information Items Discharge Condition: Stable Ambulatory Status: Ambulatory Discharge Destination: Home Transportation: Private Auto Schedule Follow-up Appointment: Yes Clinical Summary of Care: Provided on 05/29/2021 Form Type Recipient Paper Patient Patient Electronic Signature(s) Signed: 05/29/2021 11:44:17 AM By: Lorrin Jackson Entered By: Lorrin Jackson on 05/29/2021  11:44:16 -------------------------------------------------------------------------------- Patient/Caregiver Education Details Patient Name: Date of Service: [name] 12/5/2022andnbsp11:00 A M Medical Record Number: [id number] Patient Account Number: [id number] Date of Birth/Gender: Treating RN: 1950-10-08 (71 y.o. [name] Primary Care Physician: [name] Other Clinician: Referring Physician: Treating Physician/Extender: Cornelious Bryant in Treatment: 6 Education Assessment Education Provided To: Patient Education Topics Provided Venous: Methods: Explain/Verbal Responses: State content correctly Wound/Skin Impairment: Methods: Explain/Verbal Responses: State content correctly Electronic Signature(s) Signed: 05/30/2021 3:40:48 PM By: Lorrin Jackson Entered By: Lorrin Jackson on 05/29/2021 11:43:57 -------------------------------------------------------------------------------- Wound Assessment Details Patient Name: Date of Service: [name] 05/29/2021  11:00 A M Medical Record Number: [id number] Patient Account Number: [id number] Date of Birth/Sex: Treating RN: Aug 16, 1950 (70 y.o. [name] Primary Care Anielle Headrick: [name] Other Clinician: Referring Jaicob Dia: Treating Tika Hannis/Extender: Cornelious Bryant in Treatment: 6 Wound Status Wound Number: 18 Primary Diabetic Wound/Ulcer of the Lower Extremity Etiology: Wound Location: Left, Plantar Metatarsal head first Wound Open Wounding Event: Gradually Appeared Status: Date Acquired: 08/23/2020 Comorbid Glaucoma, Sleep Apnea, Hypertension, Peripheral Arterial Disease, Weeks Of Treatment: 6 History: Peripheral Venous Disease, Type II Diabetes, Gout, Osteoarthritis, Clustered Wound: No Neuropathy Wound Measurements Length: (cm) 2.9 Width: (cm) 3.7 Depth: (cm) 0.2 Area: (cm) 8.427 Volume: (cm) 1.685 Wound Description Classification: Grade 2 Wound Margin: Distinct, outline attached Exudate Amount: Large Exudate Type: Purulent Exudate Color: yellow, brown, green Foul Odor After Cleansing: Due to Product Use: Slough/Fibrino % Reduction in Area: 40.4% % Reduction in Volume: 40.4% Epithelialization: Medium (34-66%) Tunneling: No Undermining: No Yes No Yes Wound Bed Granulation Amount: Large (67-100%) Exposed Structure Granulation Quality: Red Fascia Exposed: No Necrotic Amount: None Present (0%) Fat Layer (Subcutaneous Tissue) Exposed: Yes Tendon Exposed: No Muscle Exposed: No Joint Exposed: No Bone Exposed: No Treatment Notes Wound #18 (Metatarsal head first) Wound Laterality: Plantar, Left Cleanser Normal Saline Discharge Instruction: Cleanse the wound with Normal Saline prior to applying a clean dressing using gauze sponges, not tissue or cotton balls. Soap and Water Discharge Instruction: May shower and wash wound with dial antibacterial soap and water prior to dressing change. Wound Cleanser Discharge Instruction: Cleanse the wound  with wound cleanser prior to applying a clean dressing using gauze sponges, not tissue or cotton balls. Peri-Wound Care Triamcinolone 15 (g) Discharge Instruction: Use triamcinolone 15 (g) as directed Zinc Oxide Ointment 30g tube Discharge Instruction: Apply Zinc Oxide to periwound with each dressing change Sween Lotion (Moisturizing lotion) Discharge Instruction: Apply moisturizing lotion as directed Topical Primary Dressing Hydrofera Blue Classic Foam, 4x4 in Discharge Instruction: Moisten with saline prior to applying to wound bed Secondary Dressing ABD Pad, 5x9 Discharge Instruction: Apply over primary dressing as directed. Zetuvit Plus 4x8 in Discharge Instruction: Apply over primary dressing as directed. CarboFLEX Odor Control Dressing, 4x4 in Discharge Instruction: Apply over primary dressing as directed. Secured With Compression Wrap Unnaboot w/Calamine, 4x10 (in/yd) Discharge Instruction: Apply Unnaboot as directed. May also use Miliken CoFlex Calamine 2 layer compression system as alternative. Compression Stockings Add-Ons Electronic Signature(s) Signed: 05/30/2021 3:40:48 PM By: Lorrin Jackson Entered By: Lorrin Jackson on 05/29/2021 11:19:19 -------------------------------------------------------------------------------- Wound Assessment Details Patient Name: Date of Service: [name] 05/29/2021 11:00 A M Medical Record Number: [id number] Patient Account Number: [id number] Date of Birth/Sex: Treating RN: 12-06-1950 (70 y.o. [name] Primary Care Jonmarc Bodkin: [name] Other Clinician: Referring Andris Brothers: Treating Jamorian Dimaria/Extender: Cornelious Bryant in Treatment: 6 Wound Status Wound Number: 19 Primary Diabetic Wound/Ulcer of the  Lower Extremity Etiology: Wound Location: Left, Anterior Lower Leg Wound Open Wounding Event: Gradually Appeared Status: Date Acquired: 08/23/2020 Comorbid Glaucoma, Sleep Apnea, Hypertension,  Peripheral Arterial Disease, Weeks Of Treatment: 6 History: Peripheral Venous Disease, Type II Diabetes, Gout, Osteoarthritis, Clustered Wound: No Neuropathy Wound Measurements Length: (cm) 4.3 Width: (cm) 3.4 Depth: (cm) 0.2 Area: (cm) 11.483 Volume: (cm) 2.297 % Reduction in Area: 20.5% % Reduction in Volume: 47% Wound Description Classification: Grade 2 Wound Margin: Distinct, outline attached Exudate Amount: Large Exudate Type: Purulent Exudate Color: yellow, brown, green Foul Odor After Cleansing: No Slough/Fibrino Yes Wound Bed Granulation Amount: Large (67-100%) Exposed Structure Granulation Quality: Red, Pink Fascia Exposed: No Necrotic Amount: Small (1-33%) Fat Layer (Subcutaneous Tissue) Exposed: Yes Necrotic Quality: Adherent Slough Tendon Exposed: No Muscle Exposed: No Joint Exposed: No Bone Exposed: No Treatment Notes Wound #19 (Lower Leg) Wound Laterality: Left, Anterior Cleanser Normal Saline Discharge Instruction: Cleanse the wound with Normal Saline prior to applying a clean dressing using gauze sponges, not tissue or cotton balls. Soap and Water Discharge Instruction: May shower and wash wound with dial antibacterial soap and water prior to dressing change. Wound Cleanser Discharge Instruction: Cleanse the wound with wound cleanser prior to applying a clean dressing using gauze sponges, not tissue or cotton balls. Peri-Wound Care Triamcinolone 15 (g) Discharge Instruction: Use triamcinolone 15 (g) as directed Zinc Oxide Ointment 30g tube Discharge Instruction: Apply Zinc Oxide to periwound with each dressing change Sween Lotion (Moisturizing lotion) Discharge Instruction: Apply moisturizing lotion as directed Topical Primary Dressing Hydrofera Blue Classic Foam, 4x4 in Discharge Instruction: Moisten with saline prior to applying to wound bed Secondary Dressing ABD Pad, 5x9 Discharge Instruction: Apply over primary dressing as  directed. Zetuvit Plus 4x8 in Discharge Instruction: Apply over primary dressing as directed. CarboFLEX Odor Control Dressing, 4x4 in Discharge Instruction: Apply over primary dressing as directed. Secured With Compression Wrap Unnaboot w/Calamine, 4x10 (in/yd) Discharge Instruction: Apply Unnaboot as directed. May also use Miliken CoFlex Calamine 2 layer compression system as alternative. Compression Stockings Add-Ons Electronic Signature(s) Signed: 05/29/2021 11:42:20 AM By: Lorrin Jackson Entered By: Lorrin Jackson on 05/29/2021 11:42:19 -------------------------------------------------------------------------------- Wound Assessment Details Patient Name: Date of Service: [name] 05/29/2021 11:00 A M Medical Record Number: [id number] Patient Account Number: [id number] Date of Birth/Sex: Treating RN: September 02, 1950 (70 y.o. [name] Primary Care Dajanay Northrup: [name] Other Clinician: Referring Sharnette Kitamura: Treating Rilee Knoll/Extender: Cornelious Bryant in Treatment: 6 Wound Status Wound Number: 21 Primary Diabetic Wound/Ulcer of the Lower Extremity Etiology: Wound Location: Left, Medial Lower Leg Wound Open Wounding Event: Gradually Appeared Status: Date Acquired: 08/23/2020 Comorbid Glaucoma, Sleep Apnea, Hypertension, Peripheral Arterial Disease, Weeks Of Treatment: 6 History: Peripheral Venous Disease, Type II Diabetes, Gout, Osteoarthritis, Clustered Wound: No Neuropathy Wound Measurements Length: (cm) 0.7 Width: (cm) 0.4 Depth: (cm) 0.4 Area: (cm) 0.22 Volume: (cm) 0.088 % Reduction in Area: 92.5% % Reduction in Volume: 90% Epithelialization: Large (67-100%) Tunneling: No Undermining: No Wound Description Classification: Grade 2 Wound Margin: Distinct, outline attached Exudate Amount: Medium Exudate Type: Purulent Exudate Color: yellow, brown, green Foul Odor After Cleansing: No Slough/Fibrino Yes Wound Bed Granulation  Amount: Large (67-100%) Exposed Structure Granulation Quality: Red Fascia Exposed: No Necrotic Amount: Small (1-33%) Fat Layer (Subcutaneous Tissue) Exposed: Yes Necrotic Quality: Adherent Slough Tendon Exposed: No Muscle Exposed: No Joint Exposed: No Bone Exposed: No Treatment Notes Wound #21 (Lower Leg) Wound Laterality: Left, Medial Cleanser Normal Saline Discharge Instruction: Cleanse the wound with Normal Saline prior to  applying a clean dressing using gauze sponges, not tissue or cotton balls. Soap and Water Discharge Instruction: May shower and wash wound with dial antibacterial soap and water prior to dressing change. Wound Cleanser Discharge Instruction: Cleanse the wound with wound cleanser prior to applying a clean dressing using gauze sponges, not tissue or cotton balls. Peri-Wound Care Triamcinolone 15 (g) Discharge Instruction: Use triamcinolone 15 (g) as directed Zinc Oxide Ointment 30g tube Discharge Instruction: Apply Zinc Oxide to periwound with each dressing change Sween Lotion (Moisturizing lotion) Discharge Instruction: Apply moisturizing lotion as directed Topical Primary Dressing Hydrofera Blue Classic Foam, 4x4 in Discharge Instruction: Moisten with saline prior to applying to wound bed Secondary Dressing ABD Pad, 5x9 Discharge Instruction: Apply over primary dressing as directed. Zetuvit Plus 4x8 in Discharge Instruction: Apply over primary dressing as directed. CarboFLEX Odor Control Dressing, 4x4 in Discharge Instruction: Apply over primary dressing as directed. Secured With Compression Wrap Unnaboot w/Calamine, 4x10 (in/yd) Discharge Instruction: Apply Unnaboot as directed. May also use Miliken CoFlex Calamine 2 layer compression system as alternative. Compression Stockings Add-Ons Electronic Signature(s) Signed: 05/29/2021 11:42:57 AM By: Lorrin Jackson Entered By: Lorrin Jackson on 05/29/2021  11:42:56 -------------------------------------------------------------------------------- Vitals Details Patient Name: Date of Service: [name] 05/29/2021 11:00 A M Medical Record Number: [id number] Patient Account Number: [id number] Date of Birth/Sex: Treating RN: 15-Oct-1950 (70 y.o. [name] Primary Care Kasiah Manka: [name] Other Clinician: Referring Yamilka Lopiccolo: Treating Jathniel Smeltzer/Extender: Cornelious Bryant in Treatment: 6 Vital Signs Time Taken: 11:16 Temperature (F): 98.7 Height (in): 74 Pulse (bpm): 71 Weight (lbs): 238 Respiratory Rate (breaths/min): 18 Body Mass Index (BMI): 30.6 Blood Pressure (mmHg): 129/79 Reference Range: 80 - 120 mg / dl Electronic Signature(s) Signed: 05/30/2021 3:40:48 PM By: Lorrin Jackson Entered By: Lorrin Jackson on 05/29/2021 11:16:55

## 2021-05-30 NOTE — Progress Notes (Signed)
ODUS, CLASBY ([id number]) Visit Report for 05/29/2021 SuperBill Details Patient Name: Date of Service: [name], [name] 05/29/2021 Medical Record Number: [id number] Patient Account Number: [id number] Date of Birth/Sex: Treating RN: [birth year] (70 y.o. 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

## 2021-05-31 ENCOUNTER — Other Ambulatory Visit: Payer: Self-pay

## 2021-05-31 ENCOUNTER — Encounter: Payer: Self-pay | Admitting: Vascular Surgery

## 2021-05-31 ENCOUNTER — Ambulatory Visit (INDEPENDENT_AMBULATORY_CARE_PROVIDER_SITE_OTHER): Payer: BC Managed Care – PPO | Admitting: Vascular Surgery

## 2021-05-31 VITALS — BP 121/81 | HR 68 | Temp 98.0°F | Resp 20 | Ht 74.0 in | Wt 257.0 lb

## 2021-05-31 DIAGNOSIS — M79605 Pain in left leg: Secondary | ICD-10-CM | POA: Diagnosis not present

## 2021-05-31 DIAGNOSIS — I739 Peripheral vascular disease, unspecified: Secondary | ICD-10-CM

## 2021-05-31 DIAGNOSIS — M7989 Other specified soft tissue disorders: Secondary | ICD-10-CM

## 2021-05-31 NOTE — Progress Notes (Signed)
[name] ([id number]) Visit Report for 05/30/2021 HPI Details Patient Name: Date of Service: [name] 05/30/2021 9:45 A M Medical Record Number: [id number] Patient Account Number: [id number] Date of Birth/Sex: Treating RN: [birth year] (70 y.o. [name] Primary Care Provider: [name] Other Clinician: Referring Provider: Treating Provider/Extender: Stormy Card in Treatment: 6 History of Present Illness HPI Description: 10/11/17; [name] is a 71 year old man who tells me that in 2015 he slipped down the latter traumatizing his left leg. He developed a wound in the same spot the area that we are currently looking at. He states this closed over for the most part although he always felt it was somewhat unstable. In 2016 he hit the same area with the door of his car had this reopened. He tells me that this is never really closed although sometimes an inflow it remains open on a constant basis. He has not been using any specific dressing to this except for topical antibiotics the nature of which were not really sure. His primary doctor did send him to see Dr. [name] of interventional cardiology. He underwent an angiogram on 08/06/17 and he underwent a PTA and directional atherectomy of the lesser distal SFA and popliteal arteries which resulted in brisk improvement in blood flow. It was noted that he had 2 vessel runoff through the anterior tibial and peroneal. He is also been to see vascular and interventional radiologist. He was not felt to have any significant superficial venous insufficiency. Presumably is not a candidate for any ablation. It was suggested he come here for wound care. The patient is a type II diabetic on insulin. He also has a history of venous insufficiency. ABIs on the left were noncompressible in our clinic 10/21/17; patient we admitted to the clinic last week. 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 We put Iodosorb on him after an aggressive debridement and 3 layer compression. He complained of pain in his ankle and itching with is skin in fact he scratched the area on the medial calf superiorly at the rim of our wraps and he has 2 small open areas in that location today which are new. I changed his primary dressing today to silver collagen. As noted he is already had revascularization and does not have any significant superficial venous insufficiency that would be amenable to ablation 10/28/17; patient admitted to the clinic 2 weeks ago. He has a smaller Wound. Scratch injury from last week revealed. There is large wound over the tibial area. This is smaller. Granulation looks healthy. No need for debridement. 11/04/17; the wound on the left lateral calf looks better. Improved dimensions. Surface of this looks better. We've been maintaining him and Kerlix Coban wraps. He finds this much more comfortable. Silver collagen dressing 11/11/17; left lateral Wound continues to look healthy be making progress. Using a #5 curet I removed removed nonviable skin from the surface of the wound and then necrotic debris from the wound surface. Surface of the wound continues to look healthy. He also has an open area on the left great toenail bed. We've been using topical antibiotics. 11/19/17; left anterior lateral wound continues to look healthy but it's not closed. He also had a small wound above this on the left leg Initially traumatic wounds in the setting of significant chronic venous insufficiency and stasis dermatitis 11/25/17; left anterior wounds superiorly is closed still a small wound inferiorly.  12/02/17; left anterior tibial area. Arrives today with adherent callus. Post debridement clearly not completely closed. Hydrofera Blue under 3 layer compression. 12/09/17; left anterior tibia. Circumferential eschar however the wound bed looks stable to improved. We've been  using Hydrofera Blue under 3 layer compression 12/17/17; left anterior tibia. Apparently this was felt to be closed however when the wrap was taken off there is a skin tear to reopen wounds in the same area we've been using Hydrofera Blue under 3 layer compression 12/23/17 left anterior tibia. Not close to close this week apparently the [facility] was stuck to this again. Still circumferential eschar requiring debridement. I put a contact layer on this this time under the Hydrofera Blue 12/31/17; left anterior tibia. Wound is better slight amount of hyper-granulation. Using Hydrofera Blue over Adaptic. 01/07/18; left anterior tibia. The wound had some surface eschar however after this was removed he has no open wound.he was already revascularized by Dr. [name] when he came to our clinic with atherectomy of the left SFA and popliteal artery. He was also sent to interventional radiology for venous reflux studies. He was not felt to have significant reflux but certainly has chronic venous changes of his skin with hemosiderin deposition around this area. He will definitely need to lubricate his skin and wear compression stocking and I've talked to him about this. READMISSION 05/26/2018 This is a now 70 year old man we cared for with traumatic wounds on his left anterior lower extremity. He had been previously revascularized during that admission by Dr. [name]. Apparently in follow-up Dr. [name] noted that he had deterioration in his arterial status. He underwent a stent placement in the distal left SFA on 04/22/2018. 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 He went back to the angiography suite on 04/30/2018 he underwent PTA and balloon angioplasty of the occluded left mid anterior tibial artery, thrombotic occlusion went from 100 to 0% which reconstitutes the posterior tibial artery. He had thrombectomy and aspiration of the peroneal artery. The stent placed in the distal SFA left SFA was  still occluded. 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 He has been recuperating at [facility] after the most recent hospitalization. He is going home tomorrow. He tells me that 2 weeks ago he traumatized the tip of his left fifth toe. He came in urgently for our review of this. This was a history of before I noted that Dr. [name] had already noted dry gangrenous changes of the left fifth toe 06/09/2018; 2-week follow-up. I did contact Dr. [name] after his last appointment and he apparently saw 1 of Dr. Irven Shelling colleagues the next day. He does not follow-up with Dr. [name] himself until Thursday of this week. 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 Nevertheless there is no evidence of infection no drainage and no pain. He had a new area that this week when we were signing him in today on the left anterior mid tibia area, this is in close proximity to the previous wound we have dealt with in this clinic. 06/23/2018; 2-week follow-up. I did not receive a recent note from Dr. [name] to review today. Our office is trying to obtain this. He is  apparently not planning to do further vascular interventions and wondered about compression to try and help with the patient's chronic venous insufficiency. However we are also concerned about the arterial flow. He arrives in clinic today with a new area on the left third toe. The areas on the calf/anterior tibia are close to closing. The left fifth toe is still mummified using Betadine. -In reviewing things with the patient he has what sounds like claudication with mild to moderate amount of activity. 06/27/2018; x-ray of his foot suggested osteomyelitis of the left third  toe. I prescribed Levaquin over the phone while we attempted to arrange a plan of care. However the patient called yesterday to report he had low-grade fever and he came in today acutely. There is been a marked deterioration in the left third toe with spreading cellulitis up into the dorsal left foot. He was referred to the emergency room. Readmission: 06/29/2020 patient presents today for reevaluation here in our clinic he was previously treated by Dr. Dellia Nims at the latter part of 2019 in 2 the beginning of 2020. Subsequently we have not seen him since that time in the interim he did have evaluation with vein and vascular specialist specifically Dr. Anice Paganini who did perform quite extensive work for a left femoral to anterior tibial artery bypass. With that being said in the interim the patient has developed significant lymphedema and has wounds that he tells me have really never healed in regard to the incision site on the left leg. He also has multiple wounds on the feet for various reasons some of which is that he tends to pick at his feet. Fortunately there is no signs of active infection systemically at this time he does have some wounds that are little bit deeper but most are fairly superficial he seems to have good blood flow and overall everything appears to be healthy I see no bone exposed and no obvious signs of osteomyelitis. I do not know that he necessarily needs a x-ray at this point although that something we could consider depending on how things progress. The patient does have a history of lymphedema, diabetes, this is type II, chronic kidney disease stage III, hypertension, and history of peripheral vascular disease. 07/05/2020; patient admitted last week. Is a patient I remember from 2019 he had a spreading infection involving the left foot and we sent him to the hospital. 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 He subsequently had a left  femoral to anterior tibial bypass by Dr.Cain vein and vascular. He also has severe lymphedema with chronic skin changes related to that on the left leg. The most problematic area that was new today was on the left medial great toe. This was apparently a small area last week there was purulent drainage which our intake nurse cultured. Also areas on the left medial foot and heel left lateral foot. He has 2 areas on the left medial calf left lateral calf in the setting of the severe lymphedema. 07/13/2020 on evaluation today patient appears to be doing better in my opinion compared to his last visit. The good news is there is no signs of active infection systemically and locally I do not see any signs of infection either. He did have an x-ray which was negative that is great news he had a culture which showed MRSA but at the same time he is been on the doxycycline which has helped. I do think we  may want to extend this for 7 additional days 1/25; patient admitted to the clinic a few weeks ago. 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 We have been putting him under compression his edema control is a lot better but he is severe verricused skin on the left leg. He is really done quite well he still has an open area on the left medial calf and the left medial first metatarsal head. We have been using silver collagen on the leg silver alginate on the foot 07/27/2020 upon evaluation today patient appears to be doing decently well in regard to his wounds. He still has a lot of dry skin on the left leg. Some of this is starting to peel back and I think he may be able to have them out by removing some that today. Fortunately there is no signs of active infection at this time on the left leg although on the right leg he does appear to have swelling and erythema as well as some mild warmth to touch. This does have been concerned about the possibility of cellulitis although  within the differential diagnosis I do think that potentially a DVT has to be at least considered. We need to rule that out before proceeding would just call in the cellulitis. Especially since he is having pain in the posterior aspect of his calf muscle. 2/8; the patient had seen sparingly. He has severe skin changes of chronic lymphedema in the left leg thickened hyperkeratotic verrucous skin. He has an open wound on the medial part of the left first met head left mid tibia. He also has a rim of nonepithelialized skin in the anterior mid tibia. He brought in the AmLactin lotion that was been prescribed although I am not sure under compression and its utility. There concern about cellulitis on the right lower leg the last time he was here. He was put on on antibiotics. His DVT rule out was negative. The right leg looks fine he is using his stocking on this area 08/10/2020 upon evaluation today patient appears to be doing well with regard to his leg currently. He has been tolerating the dressing changes without complication. Fortunately there is no signs of active infection which is great news. Overall very pleased with where things stand. 2/22; the patient still has an area on the medial part of the left first met his head. This looks better than when I last saw this earlier this month he has a rim of epithelialization but still some surface debris. Mostly everything on the left leg is healed. There is still a vulnerable in the left mid tibia area. 08/30/2020 upon evaluation today patient appears to be doing much better in regard to his wounds on his foot. 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 Nonetheless I think we will address that today I Minna send in a prescription for him in that regard. Overall though there does not appear to be any signs of significant  worsening. 09/07/2020 on evaluation today patient's wounds over his left foot appear to be doing excellent. I do not see any signs of infection there is some callus buildup this can require debridement for certain but overall I feel like he is managing quite nicely. He still using the AmLactin cream which has been beneficial for him as well. 3/22; left  foot wound is closed. There is no open area here. He is using ammonium lactate lotion to the lower extremities to help exfoliate dry cracked skin. He has compression stockings from elastic therapy in [place]. The wound on the medial part of his left first met head is healed today. READMISSION 04/12/2021 Mr. Lisby is a patient we know fairly well he had a prolonged stay in clinic in 2019 with wounds on his left lateral and left anterior lower extremity in the setting of chronic venous insufficiency. More recently he was here earlier this year with predominantly an area on his left foot first metatarsal head plantar and he says the plantar foot broke down on its not long after we discharged him but he did not come back here. The last few months areas of broken down on his left anterior and again the left lateral lower extremity. The leg itself is very swollen chronically enlarged a lot of hyperkeratotic dry Berry Q skin in the left lower leg. His edema extends well into the thigh. He was seen by Dr. Donzetta Matters. He had ABIs on 03/02/2021 showing an ABI on the right of 1 with a TBI of 0.72 his ABI in the left at 1.09 TBI of 0.99. Monophasic and biphasic waveforms on the right. On the left monophasic waveforms were noted he went on to have an angiogram on 03/27/2021 this showed the aortic aortic and iliac segments were free of flow-limiting stenosis the left common femoral vein to evaluate the left femoral to anterior tibial artery bypass was unobstructed the bypass was patent without any areas of stenosis. We discharged the patient in bilateral juxta lite  stockings but very clearly that was not sufficient to control the swelling and maintain skin integrity. He is clearly going to need compression pumps. 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 This is fortunate because he is on his feet for long periods of time. 10/27; patient comes in with our intake nurse reporting copious amount of green drainage from the left anterior mid tibia the left dorsal foot and to a lesser extent the left medial mid tibia. We left the compression wrap on all week for the amount of edema in his left leg is quite a bit better. We use silver alginate as the primary dressing 11/3; edema control is good. Left anterior lower leg left medial lower leg and the plantar first metatarsal head. The left anterior lower leg required debridement. Deep tissue culture I did of this wound showed MRSA I put him on 10 days of doxycycline which she will start today. We have him in compression wraps. He has a security card and [employer] however he is retiring on November 15. We will need to then get him into a better offloading boot for the left foot perhaps a total contact cast 11/10; edema control is quite good. Left anterior and left medial lower leg wounds in the setting of chronic venous insufficiency and lymphedema. He also has a substantial area over the left plantar first metatarsal head. 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 He has significant hypergranulation on the left plantar foot wound. The patient is a diabetic but he does not have significant PAD 11/17; edema control is quite good. Left anterior and left medial lower leg wounds look better. The really concerning area remains the  area on the left plantar first metatarsal head. He has a rim of epithelialization. He has been using a surgical shoe The patient is now retired from a a  [employer] I have gone over with him the need to offload this area aggressively. Starting today with a forefoot off loader but . possibly a total contact cast. 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 Arrives with a very significant odor from I think all of the wounds on the left leg and the left foot. Because of this I did not put a total contact cast on him today but will could still consider this. His wife was having cataract surgery which is the reason he missed the appointment 12/6. I saw this man 5 days ago with a swelling below the popliteal fossa. I thought he actually might have a Baker's cyst however the DVT rule out study that we could arrange right away was negative the technician told me this was not a ruptured Baker's cyst. We attempted to get this aspirated by under ultrasound guidance in interventional radiology however all they did was an ultrasound however it shows an extensive fluid collection 62 x 8 x 9.4 in the left thigh and left calf. The patient states he thinks this started 8 days ago or so but he really is not complaining of any pain, fever or systemic symptoms. He has not had any trauma Electronic Signature(s) Signed: 05/30/2021 4:21:48 PM By: Linton Ham MD Entered By: Linton Ham on 05/30/2021 11:12:39 -------------------------------------------------------------------------------- Physical Exam Details Patient Name: Date of Service: [name]. 05/30/2021 9:45 A M Medical Record Number: [id number] Patient Account Number: [id number] Date of Birth/Sex: Treating RN: 01-11-1951 (70 y.o. [name] Primary Care Provider: [name] Other Clinician: Referring Provider: Treating Provider/Extender: Stormy Card in Treatment: 6 Constitutional Sitting or standing Blood Pressure is within target range for  patient.. Pulse regular and within target range for patient.Marland Kitchen Respirations regular, non-labored and within target range.. Temperature is normal and within the target range for the patient.Marland Kitchen Appears in no distress. He does not appear to be systemically unwell. Respiratory work of breathing is normal. Notes PlantarWound exam; his wounds on the left anterior left lateral first metatarsal head all look satisfactory. He has a grapefruit sized swelling just below his left popliteal fossa and by palpation this extends almost into his posterior medial thigh. There is absolutely no tenderness no crepitus no warmth Electronic Signature(s) Signed: 05/30/2021 4:21:48 PM By: Linton Ham MD Entered By: Linton Ham on 05/30/2021 11:14:27 -------------------------------------------------------------------------------- Physician Orders Details Patient Name: Date of Service: [name]. 05/30/2021 9:45 A M Medical Record Number: [id number] Patient Account Number: [id number] Date of Birth/Sex: Treating RN: 1950/11/01 (70 y.o. [name] Primary Care Provider: [name] Other Clinician: Referring Provider: Treating Provider/Extender: Stormy Card in Treatment: 6 Verbal / Phone Orders: No Diagnosis Coding Follow-up Appointments ppointment in 1 week. - on Next Thursday with Doctor Dellia Nims...Marland KitchenCANCEL thisThursday (12/08) appt. Return A Nurse Visit: - on Monday Bathing/ Shower/ Hygiene May shower with protection but do not get wound dressing(s) wet. - Use a cast protector so you can shower without getting your wrap(s) wet Edema Control - Lymphedema / SCD / Other Elevate legs to the level of the heart or above for 30 minutes daily and/or when sitting, a frequency of:  Avoid standing for long periods of time. Patient to wear own compression stockings every day. - on right leg; Moisturize legs daily. - Ammonium LACTATE to BLE every day. Off-Loading Wedge shoe to: -  left front foot offloader Wound Treatment Wound #18 - Metatarsal head first Wound Laterality: Plantar, Left Cleanser: Normal Saline 1 x Per Week/7 Days Discharge Instructions: Cleanse the wound with Normal Saline prior to applying a clean dressing using gauze sponges, not tissue or cotton balls. Cleanser: Soap and Water 1 x Per Week/7 Days Discharge Instructions: May shower and wash wound with dial antibacterial soap and water prior to dressing change. Cleanser: Wound Cleanser 1 x Per Week/7 Days Discharge Instructions: Cleanse the wound with wound cleanser prior to applying a clean dressing using gauze sponges, not tissue or cotton balls. Peri-Wound Care: Triamcinolone 15 (g) 1 x Per Week/7 Days Discharge Instructions: Use triamcinolone 15 (g) as directed Peri-Wound Care: Zinc Oxide Ointment 30g tube 1 x Per Week/7 Days Discharge Instructions: Apply Zinc Oxide to periwound with each dressing change Peri-Wound Care: Sween Lotion (Moisturizing lotion) 1 x Per Week/7 Days Discharge Instructions: Apply moisturizing lotion as directed Prim Dressing: Hydrofera Blue Classic Foam, 4x4 in 1 x Per Week/7 Days ary Discharge Instructions: Moisten with saline prior to applying to wound bed Secondary Dressing: ABD Pad, 5x9 1 x Per Week/7 Days Discharge Instructions: Apply over primary dressing as directed. Secondary Dressing: Zetuvit Plus 4x8 in 1 x Per Week/7 Days Discharge Instructions: Apply over primary dressing as directed. Secondary Dressing: CarboFLEX Odor Control Dressing, 4x4 in 1 x Per Week/7 Days Discharge Instructions: Apply over primary dressing as directed. Compression Wrap: Unnaboot w/Calamine, 4x10 (in/yd) 1 x Per Week/7 Days Discharge Instructions: Apply Unnaboot as directed. May also use Miliken CoFlex Calamine 2 layer compression system as alternative. Wound #19 - Lower Leg Wound Laterality: Left, Anterior Cleanser: Normal Saline 1 x Per Week/7 Days Discharge Instructions: Cleanse  the wound with Normal Saline prior to applying a clean dressing using gauze sponges, not tissue or cotton balls. Cleanser: Soap and Water 1 x Per Week/7 Days Discharge Instructions: May shower and wash wound with dial antibacterial soap and water prior to dressing change. Cleanser: Wound Cleanser 1 x Per Week/7 Days Discharge Instructions: Cleanse the wound with wound cleanser prior to applying a clean dressing using gauze sponges, not tissue or cotton balls. Peri-Wound Care: Triamcinolone 15 (g) 1 x Per Week/7 Days Discharge Instructions: Use triamcinolone 15 (g) as directed Peri-Wound Care: Zinc Oxide Ointment 30g tube 1 x Per Week/7 Days Discharge Instructions: Apply Zinc Oxide to periwound with each dressing change Peri-Wound Care: Sween Lotion (Moisturizing lotion) 1 x Per Week/7 Days Discharge Instructions: Apply moisturizing lotion as directed Prim Dressing: Hydrofera Blue Classic Foam, 4x4 in 1 x Per Week/7 Days ary Discharge Instructions: Moisten with saline prior to applying to wound bed Secondary Dressing: ABD Pad, 5x9 1 x Per Week/7 Days Discharge Instructions: Apply over primary dressing as directed. Secondary Dressing: Zetuvit Plus 4x8 in 1 x Per Week/7 Days Discharge Instructions: Apply over primary dressing as directed. Secondary Dressing: CarboFLEX Odor Control Dressing, 4x4 in 1 x Per Week/7 Days Discharge Instructions: Apply over primary dressing as directed. Compression Wrap: Unnaboot w/Calamine, 4x10 (in/yd) 1 x Per Week/7 Days Discharge Instructions: Apply Unnaboot as directed. May also use Miliken CoFlex Calamine 2 layer compression system as alternative. Wound #21 - Lower Leg Wound Laterality: Left, Medial Cleanser: Normal Saline 1 x Per Week/7 Days Discharge Instructions: Cleanse the wound with Normal Saline  prior to applying a clean dressing using gauze sponges, not tissue or cotton balls. Cleanser: Soap and Water 1 x Per Week/7 Days Discharge Instructions: May  shower and wash wound with dial antibacterial soap and water prior to dressing change. Cleanser: Wound Cleanser 1 x Per Week/7 Days Discharge Instructions: Cleanse the wound with wound cleanser prior to applying a clean dressing using gauze sponges, not tissue or cotton balls. Peri-Wound Care: Triamcinolone 15 (g) 1 x Per Week/7 Days Discharge Instructions: Use triamcinolone 15 (g) as directed Peri-Wound Care: Zinc Oxide Ointment 30g tube 1 x Per Week/7 Days Discharge Instructions: Apply Zinc Oxide to periwound with each dressing change Peri-Wound Care: Sween Lotion (Moisturizing lotion) 1 x Per Week/7 Days Discharge Instructions: Apply moisturizing lotion as directed Prim Dressing: Hydrofera Blue Classic Foam, 4x4 in 1 x Per Week/7 Days ary Discharge Instructions: Moisten with saline prior to applying to wound bed Secondary Dressing: ABD Pad, 5x9 1 x Per Week/7 Days Discharge Instructions: Apply over primary dressing as directed. Secondary Dressing: Zetuvit Plus 4x8 in 1 x Per Week/7 Days Discharge Instructions: Apply over primary dressing as directed. Secondary Dressing: CarboFLEX Odor Control Dressing, 4x4 in 1 x Per Week/7 Days Discharge Instructions: Apply over primary dressing as directed. Compression Wrap: Unnaboot w/Calamine, 4x10 (in/yd) 1 x Per Week/7 Days Discharge Instructions: Apply Unnaboot as directed. May also use Miliken CoFlex Calamine 2 layer compression system as alternative. Laboratory CBC W A Differential panel in Blood (HEM-CBC) uto LOINC Code: 301 464 0011 Convenience Name: CBC W Auto Differential panel Custom Services follow-up with Dr. Donzetta Matters [facility]gent appt. referral for pt. to see Dr. Donzetta Matters d/t Left leg swelling and Ultrasound showing large fluid collection Electronic Signature(s) Signed: 05/30/2021 4:21:48 PM By: Linton Ham MD Signed: 05/31/2021 4:34:42 PM By: Rhae Hammock RN Entered By: Rhae Hammock on 05/30/2021  11:35:25 Prescription 05/30/2021 -------------------------------------------------------------------------------- Gerlean Ren MD Patient Name: Provider: Mar 16, 1951 [id number] Date of Birth: NPI#: [name] [id number] Sex: DEA #: [phone] Phone #: License #: [place] Patient Address: [address], [place] [address] [place], [place] [address] [phone] Allergies No Known Drug Allergies Provider's Orders CBC W A Differential panel in Blood uto LOINC Code: 05397-6 Convenience Name: CBC W Auto Differential panel Hand Signature: Date(s): Prescription 05/30/2021 Gerlean Ren MD Patient Name: Provider: Jun 26, 1950 [id number] Date of Birth: NPI#: [name] [id number] Sex: DEA #: [phone] Phone #: License #: [place] Patient Address: [place] [address] [place], [place] [address] [place], [place] [address] [phone] Allergies No Known Drug Allergies Provider's Orders follow-up with Dr. Donzetta Matters @ [facility] - Urgent appt. referral for pt. to see Dr. Donzetta Matters d/t Left leg swelling and Ultrasound showing large fluid collection Hand Signature: Date(s): Electronic Signature(s) Signed: 05/30/2021 4:21:48 PM By: Linton Ham MD Signed: 05/31/2021 4:34:42 PM By: Rhae Hammock RN Entered By: Rhae Hammock on 05/30/2021 11:35:26 -------------------------------------------------------------------------------- Problem List Details Patient Name: Date of Service: [name]. 05/30/2021 9:45 A M Medical Record Number: [id number] Patient Account Number: [id number] Date of Birth/Sex: Treating RN: 1951-04-24 (70 y.o. [name] Primary Care Provider: [name] Other Clinician: Referring Provider: Treating Provider/Extender: Stormy Card in  Treatment: 6 Active Problems ICD-10 Encounter Code Description Active Date MDM Diagnosis E11.621 Type 2 diabetes mellitus with foot ulcer 04/12/2021 No Yes E11.51 Type 2  diabetes mellitus with diabetic peripheral angiopathy without gangrene 04/12/2021 No Yes I89.0 Lymphedema, not elsewhere classified 04/12/2021 No Yes I87.322 Chronic venous hypertension (idiopathic) with inflammation of left lower 04/12/2021 No Yes extremity 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 ulcer of other part of left lower leg with other specified 04/12/2021 No Yes severity 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 of other part of left foot with other specified 04/12/2021 No Yes severity E11.42 Type 2 diabetes mellitus with diabetic polyneuropathy 04/12/2021 No Yes Inactive Problems Resolved Problems Electronic Signature(s) Signed: 05/30/2021 4:21:48 PM By: Linton Ham MD Entered By: Linton Ham on 05/30/2021 11:10:23 -------------------------------------------------------------------------------- Progress Note Details Patient Name: Date of Service: [name]. 05/30/2021 9:45 A M Medical Record Number: [id number] Patient Account Number: [id number] Date of Birth/Sex: Treating RN: [birth year] (69 y.o. [name] Primary Care Provider: [name] Other Clinician: Referring Provider: Treating Provider/Extender: Stormy Card in Treatment: 6 Subjective History of Present Illness (HPI) 10/11/17; Mr. Moser is a 70 year old man who tells me that in 2015 he slipped down the latter traumatizing his left leg. He developed a wound in the same spot the area that we are currently looking at. He states this closed over for the most part although he always felt it was somewhat unstable. In 2016 he hit the same area with the door of his car had this reopened. He tells me that this is never really closed although sometimes an inflow it remains open on a constant basis. He has not been using any  specific dressing to this except for topical antibiotics the nature of which were not really sure. His primary doctor did send him to see Dr. [name] of interventional cardiology. He underwent an angiogram on 08/06/17 and he underwent a PTA and directional atherectomy of the lesser distal SFA and popliteal arteries which resulted in brisk improvement in blood flow. It was noted that he had 2 vessel runoff through the anterior tibial and peroneal. He is also been to see vascular and interventional radiologist. He was not felt to have any significant superficial venous insufficiency. Presumably is not a candidate for any ablation. It was suggested he come here for wound care. The patient is a type II diabetic on insulin. He also has a history of venous insufficiency. ABIs on the left were noncompressible in our clinic 10/21/17; patient we admitted to the clinic last week. 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 We put Iodosorb on him after an aggressive debridement and 3 layer compression. He complained of pain in his ankle and itching with is skin in fact he scratched the area on the medial calf superiorly at the rim of our wraps and he has 2 small open areas in that location today which are new. I changed his primary dressing today to silver collagen. As noted he is already had revascularization and does not have any significant superficial venous insufficiency that would be amenable to ablation 10/28/17; patient admitted to the clinic 2 weeks ago. He has a smaller Wound. Scratch injury from last week revealed. There is large wound over the tibial area. This is smaller. Granulation looks healthy. No need for debridement. 11/04/17; the wound on the left lateral calf looks better. Improved dimensions. Surface of this looks better. We've been maintaining him and Kerlix Coban wraps. He finds this much more comfortable. Silver collagen  dressing 11/11/17; left lateral Wound continues to look healthy be making progress. Using a #5 curet  I removed removed nonviable skin from the surface of the wound and then necrotic debris from the wound surface. Surface of the wound continues to look healthy. ooHe also has an open area on the left great toenail bed. We've been using topical antibiotics. 11/19/17; left anterior lateral wound continues to look healthy but it's not closed. ooHe also had a small wound above this on the left leg ooInitially traumatic wounds in the setting of significant chronic venous insufficiency and stasis dermatitis 11/25/17; left anterior wounds superiorly is closed still a small wound inferiorly. 12/02/17; left anterior tibial area. Arrives today with adherent callus. Post debridement clearly not completely closed. Hydrofera Blue under 3 layer compression. 12/09/17; left anterior tibia. Circumferential eschar however the wound bed looks stable to improved. We've been using Hydrofera Blue under 3 layer compression 12/17/17; left anterior tibia. Apparently this was felt to be closed however when the wrap was taken off there is a skin tear to reopen wounds in the same area we've been using Hydrofera Blue under 3 layer compression 12/23/17 left anterior tibia. Not close to close this week apparently the [facility] was stuck to this again. Still circumferential eschar requiring debridement. I put a contact layer on this this time under the Hydrofera Blue 12/31/17; left anterior tibia. Wound is better slight amount of hyper-granulation. Using Hydrofera Blue over Adaptic. 01/07/18; left anterior tibia. The wound had some surface eschar however after this was removed he has no open wound.he was already revascularized by Dr. [name] when he came to our clinic with atherectomy of the left SFA and popliteal artery. He was also sent to interventional radiology for venous reflux studies. He was not felt to have significant reflux  but certainly has chronic venous changes of his skin with hemosiderin deposition around this area. He will definitely need to lubricate his skin and wear compression stocking and I've talked to him about this. READMISSION 05/26/2018 This is a now 70 year old man we cared for with traumatic wounds on his left anterior lower extremity. He had been previously revascularized during that admission by Dr. [name]. Apparently in follow-up Dr. [name] noted that he had deterioration in his arterial status. He underwent a stent placement in the distal left SFA on 04/22/2018. 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 He went back to the angiography suite on 04/30/2018 he underwent PTA and balloon angioplasty of the occluded left mid anterior tibial artery, thrombotic occlusion went from 100 to 0% which reconstitutes the posterior tibial artery. He had thrombectomy and aspiration of the peroneal artery. The stent placed in the distal SFA left SFA was still occluded. 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 He has been recuperating at [facility] [place] in [facility] after the most recent hospitalization. He is going home tomorrow. He tells me that 2 weeks ago he traumatized the tip of his left fifth toe. He came in urgently for our review of this. This was a history of before I noted that Dr. [name] had already noted dry gangrenous changes of the left fifth toe 06/09/2018; 2-week follow-up. I did contact Dr. [name] after his last appointment and he apparently saw 1 of Dr. Irven Shelling colleagues the next day. He does not follow-up with Dr. [name] himself  until Thursday of this week. 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 Nevertheless  there is no evidence of infection no drainage and no pain. He had a new area that this week when we were signing him in today on the left anterior mid tibia area, this is in close proximity to the previous wound we have dealt with in this clinic. 06/23/2018; 2-week follow-up. I did not receive a recent note from Dr. [name] to review today. Our office is trying to obtain this. He is apparently not planning to do further vascular interventions and wondered about compression to try and help with the patient's chronic venous insufficiency. However we are also concerned about the arterial flow. ooHe arrives in clinic today with a new area on the left third toe. The areas on the calf/anterior tibia are close to closing. The left fifth toe is still mummified using Betadine. -In reviewing things with the patient he has what sounds like claudication with mild to moderate amount of activity. 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 I prescribed Levaquin over the phone while we attempted to arrange a plan of care. However the patient called yesterday to report he had low-grade fever and he came in today acutely. There is been a marked deterioration in the left third toe with spreading cellulitis up into the dorsal left foot. He was referred to the emergency room. Readmission: 06/29/2020 patient presents today for reevaluation here in our clinic he was previously treated by Dr. Dellia Nims at the latter part of 2019 in 2 the beginning of 2020. Subsequently we have not seen him since that time in the interim he did have evaluation with vein and vascular specialist specifically Dr. Anice Paganini who did perform quite extensive work for a left femoral to anterior tibial artery bypass. With that being said in the interim the patient has developed significant lymphedema and has wounds that he tells me have really never healed in regard to the incision site on the left leg. He also has  multiple wounds on the feet for various reasons some of which is that he tends to pick at his feet. Fortunately there is no signs of active infection systemically at this time he does have some wounds that are little bit deeper but most are fairly superficial he seems to have good blood flow and overall everything appears to be healthy I see no bone exposed and no obvious signs of osteomyelitis. I do not know that he necessarily needs a x-ray at this point although that something we could consider depending on how things progress. The patient does have a history of lymphedema, diabetes, this is type II, chronic kidney disease stage III, hypertension, and history of peripheral vascular disease. 07/05/2020; patient admitted last week. Is a patient I remember from 2019 he had a spreading infection involving the left foot and we sent him to the hospital. 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 He subsequently had a left femoral to anterior tibial bypass by Dr.Cain vein and vascular. He also has severe lymphedema with chronic skin changes related to that on the left leg. The most problematic area that was new today was on the left medial great toe. This was apparently a small area last week there was purulent drainage which our intake nurse cultured. Also areas on the left medial foot and heel left lateral foot. He has 2  areas on the left medial calf left lateral calf in the setting of the severe lymphedema. 07/13/2020 on evaluation today patient appears to be doing better in my opinion compared to his last visit. The good news is there is no signs of active infection systemically and locally I do not see any signs of infection either. He did have an x-ray which was negative that is great news he had a culture which showed MRSA but at the same time he is been on the doxycycline which has helped. I do think we may want to extend this for 7 additional days 1/25;  patient admitted to the clinic a few weeks ago. 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 We have been putting him under compression his edema control is a lot better but he is severe verricused skin on the left leg. He is really done quite well he still has an open area on the left medial calf and the left medial first metatarsal head. We have been using silver collagen on the leg silver alginate on the foot 07/27/2020 upon evaluation today patient appears to be doing decently well in regard to his wounds. He still has a lot of dry skin on the left leg. Some of this is starting to peel back and I think he may be able to have them out by removing some that today. Fortunately there is no signs of active infection at this time on the left leg although on the right leg he does appear to have swelling and erythema as well as some mild warmth to touch. This does have been concerned about the possibility of cellulitis although within the differential diagnosis I do think that potentially a DVT has to be at least considered. We need to rule that out before proceeding would just call in the cellulitis. Especially since he is having pain in the posterior aspect of his calf muscle. 2/8; the patient had seen sparingly. He has severe skin changes of chronic lymphedema in the left leg thickened hyperkeratotic verrucous skin. He has an open wound on the medial part of the left first met head left mid tibia. He also has a rim of nonepithelialized skin in the anterior mid tibia. He brought in the AmLactin lotion that was been prescribed although I am not sure under compression and its utility. There concern about cellulitis on the right lower leg the last time he was here. He was put on on antibiotics. His DVT rule out was negative. The right leg looks fine he is using his stocking on this area 08/10/2020 upon evaluation today patient appears to be doing well with regard to  his leg currently. He has been tolerating the dressing changes without complication. Fortunately there is no signs of active infection which is great news. Overall very pleased with where things stand. 2/22; the patient still has an area on the medial part of the left first met his head. This looks better than when I last saw this earlier this month he has a rim of epithelialization but still some surface debris. Mostly everything on the left leg is healed. There is still a vulnerable in the left mid tibia area. 08/30/2020 upon evaluation today patient appears to be doing much better in regard to his wounds on his foot. Fortunately there does not appear to be any signs of active infection systemically though locally we did culture this last week and it does appear that he does have  MRSA currently. Nonetheless I think we will address that today I Minna send in a prescription for him in that regard. Overall though there does not appear to be any signs of significant worsening. 09/07/2020 on evaluation today patient's wounds over his left foot appear to be doing excellent. I do not see any signs of infection there is some callus buildup this can require debridement for certain but overall I feel like he is managing quite nicely. He still using the AmLactin cream which has been beneficial for him as well. 3/22; left foot wound is closed. There is no open area here. He is using ammonium lactate lotion to the lower extremities to help exfoliate dry cracked skin. He has compression stockings from elastic therapy in [place]. The wound on the medial part of his left first met head is healed today. READMISSION 04/12/2021 Mr. Brabson is a patient we know fairly well he had a prolonged stay in clinic in 2019 with wounds on his left lateral and left anterior lower extremity in the setting of chronic venous insufficiency. More recently he was here earlier this year with predominantly an area on his left foot first  metatarsal head plantar and he says the plantar foot broke down on its not long after we discharged him but he did not come back here. The last few months areas of broken down on his left anterior and again the left lateral lower extremity. The leg itself is very swollen chronically enlarged a lot of hyperkeratotic dry Berry Q skin in the left lower leg. His edema extends well into the thigh. He was seen by Dr. Donzetta Matters. He had ABIs on 03/02/2021 showing an ABI on the right of 1 with a TBI of 0.72 his ABI in the left at 1.09 TBI of 0.99. Monophasic and biphasic waveforms on the right. On the left monophasic waveforms were noted he went on to have an angiogram on 03/27/2021 this showed the aortic aortic and iliac segments were free of flow-limiting stenosis the left common femoral vein to evaluate the left femoral to anterior tibial artery bypass was unobstructed the bypass was patent without any areas of stenosis. We discharged the patient in bilateral juxta lite stockings but very clearly that was not sufficient to control the swelling and maintain skin integrity. He is clearly going to need compression pumps. 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 This is fortunate because he is on his feet for long periods of time. 10/27; patient comes in with our intake nurse reporting copious amount of green drainage from the left anterior mid tibia the left dorsal foot and to a lesser extent the left medial mid tibia. We left the compression wrap on all week for the amount of edema in his left leg is quite a bit better. We use silver alginate as the primary dressing 11/3; edema control is good. Left anterior lower leg left medial lower leg and the plantar first metatarsal head. The left anterior lower leg required debridement. Deep tissue culture I did of this wound showed MRSA I put him on 10 days of doxycycline which she will start today. We have him in  compression wraps. He has a security card and [employer] however he is retiring on November 15. We will need to then get him into a better offloading boot for the left foot perhaps a total contact cast 11/10; edema control is quite good. Left anterior  and left medial lower leg wounds in the setting of chronic venous insufficiency and lymphedema. He also has a substantial area over the left plantar first metatarsal head. 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 He has significant hypergranulation on the left plantar foot wound. The patient is a diabetic but he does not have significant PAD 11/17; edema control is quite good. Left anterior and left medial lower leg wounds look better. The really concerning area remains the area on the left plantar first metatarsal head. He has a rim of epithelialization. 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 Starting today with a forefoot off loader but . possibly a total contact cast. 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 Arrives with a very significant odor from I think all of the wounds on the left leg and the left foot. Because of this I did not put a total contact cast on him today but will could still consider this. His wife was having cataract surgery which is the reason he missed the appointment 12/6. I saw this man 5 days ago with a swelling below the popliteal fossa. I thought he actually might have a Baker's cyst however the DVT rule out study that we could arrange right away was negative the technician told me this was not a ruptured Baker's cyst. We attempted to get this aspirated by under ultrasound guidance in interventional radiology  however all they did was an ultrasound however it shows an extensive fluid collection 62 x 8 x 9.4 in the left thigh and left calf. The patient states he thinks this started 8 days ago or so but he really is not complaining of any pain, fever or systemic symptoms. He has not had any trauma Objective Constitutional Sitting or standing Blood Pressure is within target range for patient.. Pulse regular and within target range for patient.Marland Kitchen Respirations regular, non-labored and within target range.. Temperature is normal and within the target range for the patient.Marland Kitchen Appears in no distress. He does not appear to be systemically unwell. Vitals Time Taken: 10:29 AM, Height: 74 in, Weight: 238 lbs, BMI: 30.6, Temperature: 98.6 F, Pulse: 77 bpm, Respiratory Rate: 17 breaths/min, Blood Pressure: 147/77 mmHg, Capillary Blood Glucose: 138 mg/dl. Respiratory work of breathing is normal. General Notes: PlantarWound exam; his wounds on the left anterior left lateral first metatarsal head all look satisfactory. He has a grapefruit sized swelling just below his left popliteal fossa and by palpation this extends almost into his posterior medial thigh. There is absolutely no tenderness no crepitus no warmth Integumentary (Hair, Skin) Wound #18 status is Open. Original cause of wound was Gradually Appeared. The date acquired was: 08/23/2020. The wound has been in treatment 6 weeks. The wound is located on the Left,Plantar Metatarsal head first. The wound measures 2.9cm length x 3.7cm width x 0.2cm depth; 8.427cm^2 area and 1.685cm^3 volume. There is a large amount of purulent drainage noted. Wound #19 status is Open. Original cause of wound was Gradually Appeared. The date acquired was: 08/23/2020. The wound has been in treatment 6 weeks. The wound is located on the Left,Anterior Lower Leg. The wound measures 4.3cm length  x 3.4cm width x 0.2cm depth; 11.483cm^2 area and 2.297cm^3 volume. There is a large amount of  purulent drainage noted. Wound #21 status is Open. Original cause of wound was Gradually Appeared. The date acquired was: 08/23/2020. The wound has been in treatment 6 weeks. The wound is located on the Left,Medial Lower Leg. The wound measures 0.7cm length x 0.4cm width x 0.4cm depth; 0.22cm^2 area and 0.088cm^3 volume. There is a medium amount of purulent drainage noted. 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 There was a Haematologist Compression Therapy Procedure by Rhae Hammock, RN. Post procedure Diagnosis Wound #18: Same as Pre-Procedure Wound #19 Pre-procedure diagnosis of Wound #19 is a Diabetic Wound/Ulcer of the Lower Extremity located on the Left,Anterior Lower Leg . There was a Haematologist Compression Therapy Procedure by Rhae Hammock, RN. Post procedure Diagnosis Wound #19: Same as Pre-Procedure Wound #21 Pre-procedure diagnosis of Wound #21 is a Diabetic Wound/Ulcer of the Lower Extremity located on the Left,Medial Lower Leg . There was a Haematologist Compression Therapy Procedure by Rhae Hammock, RN. Post procedure Diagnosis Wound #21: Same as Pre-Procedure Plan Follow-up Appointments: Return Appointment in 1 week. - on Thursday with Doctor Dellia Nims Nurse Visit: - this Thursday and Monday Bathing/ Shower/ Hygiene: May shower with protection but do not get  wound dressing(s) wet. - Use a cast protector so you can shower without getting your wrap(s) wet Edema Control - Lymphedema / SCD / Other: Elevate legs to the level of the heart or above for 30 minutes daily and/or when sitting, a frequency of: Avoid standing for long periods of time. Patient to wear own compression stockings every day. - on right leg; Moisturize legs daily. - Ammonium LACTATE to BLE every day. Off-Loading: Wedge shoe to: - left front foot offloader Laboratory ordered were: CBC W Auto Differential panel ordered were: follow-up with Dr. Donzetta Matters @ [facility] - Urgent appt. referral for pt. to see Dr. Donzetta Matters d/t Left leg swelling and Ultrasound showing large fluid collection WOUND #18: - Metatarsal head first Wound Laterality: Plantar, Left Cleanser: Normal Saline 1 x Per Week/7 Days Discharge Instructions: Cleanse the wound with Normal Saline prior to applying a clean dressing using gauze sponges, not tissue or cotton balls. Cleanser: Soap and Water 1 x Per Week/7 Days Discharge Instructions: May shower and wash wound with dial antibacterial soap and water prior to dressing change. Cleanser: Wound Cleanser 1 x Per Week/7 Days Discharge Instructions: Cleanse the wound with wound cleanser prior to applying a clean dressing using gauze sponges, not tissue or cotton balls. Peri-Wound Care: Triamcinolone 15 (g) 1 x Per Week/7 Days Discharge Instructions: Use triamcinolone 15 (g) as directed Peri-Wound Care: Zinc Oxide Ointment 30g tube 1 x Per Week/7 Days Discharge Instructions: Apply Zinc Oxide to periwound with each dressing change Peri-Wound Care: Sween Lotion (Moisturizing lotion) 1 x Per Week/7 Days Discharge Instructions: Apply moisturizing lotion as directed Prim Dressing: Hydrofera Blue Classic Foam, 4x4 in 1 x Per Week/7 Days ary  Discharge Instructions: Moisten with saline prior to applying to wound bed Secondary Dressing: ABD Pad, 5x9 1 x Per Week/7 Days Discharge  Instructions: Apply over primary dressing as directed. Secondary Dressing: Zetuvit Plus 4x8 in 1 x Per Week/7 Days Discharge Instructions: Apply over primary dressing as directed. Secondary Dressing: CarboFLEX Odor Control Dressing, 4x4 in 1 x Per Week/7 Days Discharge Instructions: Apply over primary dressing as directed. Com pression Wrap: Unnaboot w/Calamine, 4x10 (in/yd) 1 x Per Week/7 Days Discharge Instructions: Apply Unnaboot as directed. May also use Miliken CoFlex Calamine 2 layer compression system as alternative. WOUND #19: - Lower Leg Wound Laterality: Left, Anterior Cleanser: Normal Saline 1 x Per Week/7 Days Discharge Instructions: Cleanse the wound with Normal Saline prior to applying a clean dressing using gauze sponges, not tissue or cotton balls. Cleanser: Soap and Water 1 x Per Week/7 Days Discharge Instructions: May shower and wash wound with dial antibacterial soap and water prior to dressing change. Cleanser: Wound Cleanser 1 x Per Week/7 Days Discharge Instructions: Cleanse the wound with wound cleanser prior to applying a clean dressing using gauze sponges, not tissue or cotton balls. Peri-Wound Care: Triamcinolone 15 (g) 1 x Per Week/7 Days Discharge Instructions: Use triamcinolone 15 (g) as directed Peri-Wound Care: Zinc Oxide Ointment 30g tube 1 x Per Week/7 Days Discharge Instructions: Apply Zinc Oxide to periwound with each dressing change Peri-Wound Care: Sween Lotion (Moisturizing lotion) 1 x Per Week/7 Days Discharge Instructions: Apply moisturizing lotion as directed Prim Dressing: Hydrofera Blue Classic Foam, 4x4 in 1 x Per Week/7 Days ary Discharge Instructions: Moisten with saline prior to applying to wound bed Secondary Dressing: ABD Pad, 5x9 1 x Per Week/7 Days Discharge Instructions: Apply over primary dressing as directed. Secondary Dressing: Zetuvit Plus 4x8 in 1 x Per Week/7 Days Discharge Instructions: Apply over primary dressing as  directed. Secondary Dressing: CarboFLEX Odor Control Dressing, 4x4 in 1 x Per Week/7 Days Discharge Instructions: Apply over primary dressing as directed. Com pression Wrap: Unnaboot w/Calamine, 4x10 (in/yd) 1 x Per Week/7 Days Discharge Instructions: Apply Unnaboot as directed. May also use Miliken CoFlex Calamine 2 layer compression system as alternative. WOUND #21: - Lower Leg Wound Laterality: Left, Medial Cleanser: Normal Saline 1 x Per Week/7 Days Discharge Instructions: Cleanse the wound with Normal Saline prior to applying a clean dressing using gauze sponges, not tissue or cotton balls. Cleanser: Soap and Water 1 x Per Week/7 Days Discharge Instructions: May shower and wash wound with dial antibacterial soap and water prior to dressing change. Cleanser: Wound Cleanser 1 x Per Week/7 Days Discharge Instructions: Cleanse the wound with wound cleanser prior to applying a clean dressing using gauze sponges, not tissue or cotton balls. Peri-Wound Care: Triamcinolone 15 (g) 1 x Per Week/7 Days Discharge Instructions: Use triamcinolone 15 (g) as directed Peri-Wound Care: Zinc Oxide Ointment 30g tube 1 x Per Week/7 Days Discharge Instructions: Apply Zinc Oxide to periwound with each dressing change Peri-Wound Care: Sween Lotion (Moisturizing lotion) 1 x Per Week/7 Days Discharge Instructions: Apply moisturizing lotion as directed Prim Dressing: Hydrofera Blue Classic Foam, 4x4 in 1 x Per Week/7 Days ary Discharge Instructions: Moisten with saline prior to applying to wound bed Secondary Dressing: ABD Pad, 5x9 1 x Per Week/7 Days Discharge Instructions: Apply over primary dressing as directed. Secondary Dressing: Zetuvit Plus 4x8 in 1 x Per Week/7 Days Discharge Instructions: Apply over primary dressing as directed. Secondary Dressing: CarboFLEX Odor Control Dressing, 4x4 in 1 x Per Week/7 Days Discharge Instructions: Apply  over primary dressing as directed. Com pression Wrap: Unnaboot  w/Calamine, 4x10 (in/yd) 1 x Per Week/7 Days Discharge Instructions: Apply Unnaboot as directed. May also use Miliken CoFlex Calamine 2 layer compression system as alternative. #1 I am uncertain the exact cause of this. This could be a hematoma but usually hematomas are more painful and cause skin discoloration he does not have this. I find this very unlikely to be an abscess because of the complete lack of tenderness 2. Could this be the remanence of a previous operative seromao If so the history suggest that some swelling started recently 3. I have been attempting to get this aspirated by interventional radiology but we just seem to not made any progress here. 4. I did put his Plavix on hold as of yesterday 5. I am going to send a note to Dr. Donzetta Matters to have him have a look at this leg. I am really not sure what exactly is 6. Not sure what is happening here. I suspect this is probably a hematoma but the source of the bleeding and the cause is not really clear. He was only on Plavix. He was previously previously also on Xarelto although this was stopped about a year ago. His revascularization was in 2020 7. He does not seem sick enough to go to the emergency room but if we continue to have difficulties in this expands that is where this might end up Electronic Signature(s) Signed: 05/30/2021 4:21:48 PM By: Linton Ham MD Entered By: Linton Ham on 05/30/2021 11:17:56 -------------------------------------------------------------------------------- SuperBill Details Patient Name: Date of Service: [name]. 05/30/2021 Medical Record Number: [id number] Patient Account Number: [id number] Date of Birth/Sex: Treating RN: July 03, 1950 (70 y.o. [name], [name] Primary Care Provider: [name] Other Clinician: Referring Provider: Treating Provider/Extender: Stormy Card in Treatment: 6 Diagnosis Coding ICD-10 Codes Code Description [phone] Type 2 diabetes  mellitus with foot ulcer 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 I89.0 Lymphedema, not elsewhere classified I87.322 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verity 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 of other part of left foot with other specified severity E11.42 Type 2 diabetes mellitus with diabetic polyneuropathy Facility Procedures CPT4 Code: [id number] Description: (Facility Use Only) 29580LT - APPLY UNNA BOOT LT Modifier: Quantity: 1 Physician Procedures : CPT4 Code Description Modifier [id number] - WC PHYS LEVEL 4 - EST PT ICD-10 Diagnosis Description E11.621 Type 2 diabetes mellitus with foot ulcer 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 L97.828  Non-pressure chronic ulcer of other part of left lower leg with other specified severity 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 of other part of left foot with other specified severity Quantity: 1 Electronic Signature(s) Signed: 05/30/2021 4:21:48 PM By: Linton Ham MD Entered By: Linton Ham on 05/30/2021 11:18:25
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
## 2021-05-31 NOTE — Progress Notes (Signed)
[name], [name] ([id number]) Visit Report for 05/30/2021 Arrival Information Details Patient Name: Date of Service: [name], [name] 05/30/2021 9:45 A M Medical Record Number: [id number] Patient Account Number: [id number] Date of Birth/Sex: Treating RN: 02/20/1951 (69 y.o. [name], [name] Primary Care [name]: [name] Other Clinician: Referring [name]: Treating [name]/Extender: [name] in Treatment: 6 Visit Information History Since Last Visit Added or deleted any medications: No Patient Arrived: Ambulatory Any new allergies or adverse reactions: No Arrival Time: 10:28 Had a fall or experienced change in No Accompanied By: self activities of daily living that may affect Transfer Assistance: None risk of falls: Patient Identification Verified: Yes Signs or symptoms of abuse/neglect since last visito No Secondary Verification Process Completed: Yes Hospitalized since last visit: No Patient Requires Transmission-Based Precautions: No Implantable device outside of the clinic excluding No Patient Has Alerts: Yes cellular tissue based products placed in the center Patient Alerts: ABI's: 09/22 L:1.09 R: 1. since last visit: TBI's: R:0.72 L:0.99 Has Dressing in Place as Prescribed: Yes Has Compression in Place as Prescribed: Yes Pain Present Now: No Electronic Signature(s) Signed: 05/31/2021 4:34:42 PM By: [name] Hammock RN Entered By: [name] on 05/30/2021 10:28:49 -------------------------------------------------------------------------------- Compression Therapy Details Patient Name: Date of Service: [name]. 05/30/2021 9:45 A M Medical Record Number: [id number] Patient Account Number: [id number] Date of Birth/Sex: Treating RN: 1951/01/02 (70 y.o. [name], [name] Primary Care Hina Gupta: [name] Other Clinician: Referring Lyon Dumont: Treating Sora Olivo/Extender: [name] in Treatment:  6 Compression Therapy Performed for Wound Assessment: Wound #18 Left,Plantar Metatarsal head first Performed By: Clinician [name] Hammock, RN Compression Type: Rolena Infante Post Procedure Diagnosis Same as Pre-procedure Electronic Signature(s) Signed: 05/31/2021 4:34:42 PM By: [name] Hammock RN Entered By: [name] on 05/30/2021 11:08:02 -------------------------------------------------------------------------------- Compression Therapy Details Patient Name: Date of Service: [name]. 05/30/2021 9:45 A M Medical Record Number: [id number] Patient Account Number: [id number] Date of Birth/Sex: Treating RN: Oct 23, 1950 (70 y.o. [name], [name] Primary Care Persis Graffius: [name] Other Clinician: Referring Dyke Weible: Treating Laretta Pyatt/Extender: [name] in Treatment: 6 Compression Therapy Performed for Wound Assessment: Wound #19 Left,Anterior Lower Leg Performed By: Clinician [name] Hammock, RN Compression Type: Rolena Infante Post Procedure Diagnosis Same as Pre-procedure Electronic Signature(s) Signed: 05/31/2021 4:34:42 PM By: [name] Hammock RN Entered By: [name] on 05/30/2021 11:08:02 -------------------------------------------------------------------------------- Compression Therapy Details Patient Name: Date of Service: [name]. 05/30/2021 9:45 A M Medical Record Number: [id number] Patient Account Number: [id number] Date of Birth/Sex: Treating RN: 12-Sep-1950 (70 y.o. [name], [name] Primary Care Marialy Urbanczyk: [name] Other Clinician: Referring Suhaila Troiano: Treating Daryl Beehler/Extender: [name] in Treatment: 6 Compression Therapy Performed for Wound Assessment: Wound #21 Left,Medial Lower Leg Performed By: Clinician [name] Hammock, RN Compression Type: Rolena Infante Post Procedure Diagnosis Same as Pre-procedure Electronic Signature(s) Signed: 05/31/2021 4:34:42 PM By:  [name] Hammock RN Entered By: [name] on 05/30/2021 11:08:02 -------------------------------------------------------------------------------- Encounter Discharge Information Details Patient Name: Date of Service: [name]. 05/30/2021 9:45 A M Medical Record Number: [id number] Patient Account Number: [id number] Date of Birth/Sex: Treating RN: [birth year] (70 y.o. [name], [name] Primary Care Ricci Paff: [name] Other Clinician: Referring Kellyann Ordway: Treating Aayan Haskew/Extender: [name] in Treatment: 6 Encounter Discharge Information Items Discharge Condition: Stable Ambulatory Status: Ambulatory Discharge Destination: Home Transportation: Private Auto Accompanied By: self Schedule Follow-up Appointment: Yes Clinical Summary of Care: Patient Declined Electronic Signature(s) Signed: 05/31/2021 4:34:42 PM By: [name],  [name] RN Entered By: [name] on 05/30/2021 11:09:18 -------------------------------------------------------------------------------- Lower Extremity Assessment Details Patient Name: Date of Service: [name] 05/30/2021 9:45 A M Medical Record Number: [id number] Patient Account Number: [id number] Date of Birth/Sex: Treating RN: [birth year] (70 y.o. [name], [name] Primary Care Provider: Jilda [name] Other Clinician: Referring Provider: Treating Provider/Extender: [name] in Treatment: 6 Edema Assessment Assessed: [name]: Yes] [name]: No] Edema: [Left: Ye] [Right: s] Calf Left: Right: Point of Measurement: 41 cm From Medial Instep 46 cm Ankle Left: Right: Point of Measurement: 10 cm From Medial Instep 28 cm Vascular Assessment Pulses: Dorsalis Pedis Palpable: [Left:Yes] Posterior Tibial Palpable: [Left:Yes] Electronic Signature(s) Signed: 05/31/2021 4:34:42 PM By: [name] Hammock RN Entered By: [name] on 05/30/2021  10:31:46 -------------------------------------------------------------------------------- Multi Wound Chart Details Patient Name: Date of Service: [name]. 05/30/2021 9:45 A M Medical Record Number: [id number] Patient Account Number: [id number] Date of Birth/Sex: Treating RN: Sep 03, 1950 (69 y.o. [name], [name] Primary Care Provider: Jilda [name] Other Clinician: Referring Provider: Treating Provider/Extender: [name] in Treatment: 6 Vital Signs Height(in): 74 Capillary Blood Glucose(mg/dl): 138 Weight(lbs): 238 Pulse(bpm): 86 Body Mass Index(BMI): 31 Blood Pressure(mmHg): 147/77 Temperature(F): 98.6 Respiratory Rate(breaths/min): 17 Photos: [18:No Photos Left, Plantar Metatarsal head first] [19:No Photos Left, Anterior Lower Leg] [21:No Photos Left, Medial Lower Leg] Wound Location: [18:Gradually Appeared] [19:Gradually Appeared] [21:Gradually Appeared] Wounding Event: [18:Diabetic Wound/Ulcer of the Lower] [19:Diabetic Wound/Ulcer of the Lower] [21:Diabetic Wound/Ulcer of the Lower] Primary Etiology: [18:Extremity 08/23/2020] [19:Extremity 08/23/2020] [21:Extremity 08/23/2020] Date Acquired: [18:6] [19:6] [21:6] Weeks of Treatment: [18:Open] [19:Open] [21:Open] Wound Status: [18:2.9x3.7x0.2] [19:4.3x3.4x0.2] [21:0.7x0.4x0.4] Measurements L x W x D (cm) [18:8.427] [19:11.483] [21:0.22] A (cm) : rea [18:1.685] [19:2.297] [21:0.088] Volume (cm) : [18:40.40%] [19:20.50%] [21:92.50%] % Reduction in A rea: [18:40.40%] [19:47.00%] [21:90.00%] % Reduction in Volume: [18:Grade 2] [19:Grade 2] [21:Grade 2] Classification: [18:Large] [19:Large] [21:Medium] Exudate A mount: [18:Purulent] [19:Purulent] [21:Purulent] Exudate Type: [18:yellow, brown, green] [19:yellow, brown, green] [21:yellow, brown, green] Exudate Color: [18:Compression Therapy] [19:Compression Therapy] [21:Compression Therapy] Treatment Notes Wound #18 (Metatarsal head first) Wound  Laterality: Plantar, Left Cleanser Normal Saline Discharge Instruction: Cleanse the wound with Normal Saline prior to applying a clean dressing using gauze sponges, not tissue or cotton balls. Soap and Water Discharge Instruction: May shower and wash wound with dial antibacterial soap and water prior to dressing change. Wound Cleanser Discharge Instruction: Cleanse the wound with wound cleanser prior to applying a clean dressing using gauze sponges, not tissue or cotton balls. Peri-Wound Care Triamcinolone 15 (g) Discharge Instruction: Use triamcinolone 15 (g) as directed Zinc Oxide Ointment 30g tube Discharge Instruction: Apply Zinc Oxide to periwound with each dressing change Sween Lotion (Moisturizing lotion) Discharge Instruction: Apply moisturizing lotion as directed Topical Primary Dressing Hydrofera Blue Classic Foam, 4x4 in Discharge Instruction: Moisten with saline prior to applying to wound bed Secondary Dressing ABD Pad, 5x9 Discharge Instruction: Apply over primary dressing as directed. Zetuvit Plus 4x8 in Discharge Instruction: Apply over primary dressing as directed. CarboFLEX Odor Control Dressing, 4x4 in Discharge Instruction: Apply over primary dressing as directed. Secured With Compression Wrap Unnaboot w/Calamine, 4x10 (in/yd) Discharge Instruction: Apply Unnaboot as directed. May also use Miliken CoFlex Calamine 2 layer compression system as alternative. Compression Stockings Add-Ons Wound #19 (Lower Leg) Wound Laterality: Left, Anterior Cleanser Normal Saline Discharge Instruction: Cleanse the wound with Normal Saline prior to applying a clean dressing using gauze sponges, not tissue or cotton balls. Soap and Water Discharge Instruction: May shower  and wash wound with dial antibacterial soap and water prior to dressing change. Wound Cleanser Discharge Instruction: Cleanse the wound with wound cleanser prior to applying a clean dressing using gauze sponges,  not tissue or cotton balls. Peri-Wound Care Triamcinolone 15 (g) Discharge Instruction: Use triamcinolone 15 (g) as directed Zinc Oxide Ointment 30g tube Discharge Instruction: Apply Zinc Oxide to periwound with each dressing change Sween Lotion (Moisturizing lotion) Discharge Instruction: Apply moisturizing lotion as directed Topical Primary Dressing Hydrofera Blue Classic Foam, 4x4 in Discharge Instruction: Moisten with saline prior to applying to wound bed Secondary Dressing ABD Pad, 5x9 Discharge Instruction: Apply over primary dressing as directed. Zetuvit Plus 4x8 in Discharge Instruction: Apply over primary dressing as directed. CarboFLEX Odor Control Dressing, 4x4 in Discharge Instruction: Apply over primary dressing as directed. Secured With Compression Wrap Unnaboot w/Calamine, 4x10 (in/yd) Discharge Instruction: Apply Unnaboot as directed. May also use Miliken CoFlex Calamine 2 layer compression system as alternative. Compression Stockings Add-Ons Wound #21 (Lower Leg) Wound Laterality: Left, Medial Cleanser Normal Saline Discharge Instruction: Cleanse the wound with Normal Saline prior to applying a clean dressing using gauze sponges, not tissue or cotton balls. Soap and Water Discharge Instruction: May shower and wash wound with dial antibacterial soap and water prior to dressing change. Wound Cleanser Discharge Instruction: Cleanse the wound with wound cleanser prior to applying a clean dressing using gauze sponges, not tissue or cotton balls. Peri-Wound Care Triamcinolone 15 (g) Discharge Instruction: Use triamcinolone 15 (g) as directed Zinc Oxide Ointment 30g tube Discharge Instruction: Apply Zinc Oxide to periwound with each dressing change Sween Lotion (Moisturizing lotion) Discharge Instruction: Apply moisturizing lotion as directed Topical Primary Dressing Hydrofera Blue Classic Foam, 4x4 in Discharge Instruction: Moisten with saline prior to applying  to wound bed Secondary Dressing ABD Pad, 5x9 Discharge Instruction: Apply over primary dressing as directed. Zetuvit Plus 4x8 in Discharge Instruction: Apply over primary dressing as directed. CarboFLEX Odor Control Dressing, 4x4 in Discharge Instruction: Apply over primary dressing as directed. Secured With Compression Wrap Unnaboot w/Calamine, 4x10 (in/yd) Discharge Instruction: Apply Unnaboot as directed. May also use Miliken CoFlex Calamine 2 layer compression system as alternative. Compression Stockings Add-Ons Electronic Signature(s) Signed: 05/30/2021 4:21:48 PM By: Linton Ham MD Signed: 05/31/2021 4:34:42 PM By: [name] Hammock RN Entered By: Linton Ham on 05/30/2021 11:10:36 -------------------------------------------------------------------------------- [place] Details Patient Name: Date of Service: [name]. 05/30/2021 9:45 A M Medical Record Number: [id number] Patient Account Number: [id number] Date of Birth/Sex: Treating RN: [birth year] (70 y.o. [name], [name] Primary Care Enis Riecke: [name] Other Clinician: Referring Rivers Gassmann: Treating Harvel Meskill/Extender: [name] in Treatment: 6 Multidisciplinary Care Plan reviewed with physician Active Inactive Soft Tissue Infection Nursing Diagnoses: Impaired tissue integrity Potential for infection: soft tissue Goals: Patient's soft tissue infection will resolve Date Initiated: 04/20/2021 Target Resolution Date: 06/09/2021 Goal Status: Active Interventions: Assess signs and symptoms of infection every visit Provide education on infection Treatment Activities: Culture : 04/20/2021 Education provided on Infection : 05/08/2021 Systemic antibiotics : 04/20/2021 T ordered outside of clinic : 04/20/2021 est Notes: Wound/Skin Impairment Nursing Diagnoses: Impaired tissue integrity Knowledge deficit related to ulceration/compromised skin  integrity Goals: Patient will have a decrease in wound volume by X% from date: (specify in notes) Date Initiated: 04/12/2021 Date Inactivated: 04/27/2021 Target Resolution Date: 04/23/2021 Goal Status: Met Patient/caregiver will verbalize understanding of skin care regimen Date Initiated: 04/12/2021 Target Resolution Date: 06/24/2021 Goal Status: Active Ulcer/skin breakdown will have a volume reduction of 30% by  week 4 Date Initiated: 04/12/2021 Date Inactivated: 04/27/2021 Target Resolution Date: 04/27/2021 Goal Status: Unmet Unmet Reason: infection Ulcer/skin breakdown will have a volume reduction of 50% by week 8 Date Initiated: 04/27/2021 Target Resolution Date: 06/24/2021 Goal Status: Active Interventions: Assess patient/caregiver ability to obtain necessary supplies Assess patient/caregiver ability to perform ulcer/skin care regimen upon admission and as needed Assess ulceration(s) every visit Notes: Electronic Signature(s) Signed: 05/31/2021 4:34:42 PM By: [name] Hammock RN Entered By: [name] on 05/30/2021 10:54:22 -------------------------------------------------------------------------------- Pain Assessment Details Patient Name: Date of Service: [name]. 05/30/2021 9:45 A M Medical Record Number: [id number] Patient Account Number: [id number] Date of Birth/Sex: Treating RN: Jan 15, 1951 (70 y.o. [name], [name] Primary Care Matisyn Cabeza: [name] Other Clinician: Referring Ginger Leeth: Treating Betsie Peckman/Extender: [name] in Treatment: 6 Active Problems Location of Pain Severity and Description of Pain Patient Has Paino No Site Locations Pain Management and Medication Current Pain Management: Electronic Signature(s) Signed: 05/31/2021 4:34:42 PM By: [name] Hammock RN Entered By: [name] on 05/30/2021  10:29:09 -------------------------------------------------------------------------------- Patient/Caregiver Education Details Patient Name: Date of Service: [name] 12/6/2022andnbsp9:45 [place] Record Number: [id number] Patient Account Number: [id number] Date of Birth/Gender: Treating RN: 1951/04/24 (70 y.o. [name] Primary Care Physician: [name] Other Clinician: Referring Physician: Treating Physician/Extender: [name] in Treatment: 6 Education Assessment Education Provided To: Patient Education Topics Provided Infection: Methods: Explain/Verbal Responses: State content correctly Electronic Signature(s) Signed: 05/31/2021 4:34:42 PM By: [name] Hammock RN Entered By: [name] on 05/30/2021 10:55:26 -------------------------------------------------------------------------------- Wound Assessment Details Patient Name: Date of Service: [name]. 05/30/2021 9:45 A M Medical Record Number: [id number] Patient Account Number: [id number] Date of Birth/Sex: Treating RN: 1951-03-29 (70 y.o. [name], Nolic Primary Care Starlett Pehrson: [name] Other Clinician: Referring Samba Cumba: Treating Ryah Cribb/Extender: [name] in Treatment: 6 Wound Status Wound Number: 18 Primary Etiology: Diabetic Wound/Ulcer of the Lower Extremity Wound Location: Left, Plantar Metatarsal head first Wound Status: Open Wounding Event: Gradually Appeared Date Acquired: 08/23/2020 Weeks Of Treatment: 6 Clustered Wound: No Wound Measurements Length: (cm) 2.9 Width: (cm) 3.7 Depth: (cm) 0.2 Area: (cm) 8.427 Volume: (cm) 1.685 % Reduction in Area: 40.4% % Reduction in Volume: 40.4% Wound Description Classification: Grade 2 Exudate Amount: Large Exudate Type: Purulent Exudate Color: yellow, brown, green Treatment Notes Wound #18 (Metatarsal head first) Wound Laterality: Plantar,  Left Cleanser Normal Saline Discharge Instruction: Cleanse the wound with Normal Saline prior to applying a clean dressing using gauze sponges, not tissue or cotton balls. Soap and Water Discharge Instruction: May shower and wash wound with dial antibacterial soap and water prior to dressing change. Wound Cleanser Discharge Instruction: Cleanse the wound with wound cleanser prior to applying a clean dressing using gauze sponges, not tissue or cotton balls. Peri-Wound Care Triamcinolone 15 (g) Discharge Instruction: Use triamcinolone 15 (g) as directed Zinc Oxide Ointment 30g tube Discharge Instruction: Apply Zinc Oxide to periwound with each dressing change Sween Lotion (Moisturizing lotion) Discharge Instruction: Apply moisturizing lotion as directed Topical Primary Dressing Hydrofera Blue Classic Foam, 4x4 in Discharge Instruction: Moisten with saline prior to applying to wound bed Secondary Dressing ABD Pad, 5x9 Discharge Instruction: Apply over primary dressing as directed. Zetuvit Plus 4x8 in Discharge Instruction: Apply over primary dressing as directed. CarboFLEX Odor Control Dressing, 4x4 in Discharge Instruction: Apply over primary dressing as directed. Secured With Compression Wrap Unnaboot w/Calamine, 4x10 (in/yd) Discharge Instruction: Apply Unnaboot as directed. May also use Miliken CoFlex  Calamine 2 layer compression system as alternative. Compression Stockings Add-Ons Electronic Signature(s) Signed: 05/31/2021 4:34:42 PM By: [name] Hammock RN Entered By: [name] on 05/30/2021 10:32:10 -------------------------------------------------------------------------------- Wound Assessment Details Patient Name: Date of Service: [name]. 05/30/2021 9:45 A M Medical Record Number: [id number] Patient Account Number: [id number] Date of Birth/Sex: Treating RN: 1951-01-31 (70 y.o. [name], [name] Primary Care Wynonna Fitzhenry: [name] Other  Clinician: Referring Malaiya Paczkowski: Treating Yailine Ballard/Extender: [name] in Treatment: 6 Wound Status Wound Number: 19 Primary Etiology: Diabetic Wound/Ulcer of the Lower Extremity Wound Location: Left, Anterior Lower Leg Wound Status: Open Wounding Event: Gradually Appeared Date Acquired: 08/23/2020 Weeks Of Treatment: 6 Clustered Wound: No Wound Measurements Length: (cm) 4.3 Width: (cm) 3.4 Depth: (cm) 0.2 Area: (cm) 11.483 Volume: (cm) 2.297 % Reduction in Area: 20.5% % Reduction in Volume: 47% Wound Description Classification: Grade 2 Exudate Amount: Large Exudate Type: Purulent Exudate Color: yellow, brown, green Treatment Notes Wound #19 (Lower Leg) Wound Laterality: Left, Anterior Cleanser Normal Saline Discharge Instruction: Cleanse the wound with Normal Soap and Water Discharge Instruction: May shower and wash wound wit Wound Cleanser Discharge Instruction: Cleanse the wound with wound Saline prior to applying a clean dressing using gauze sponges, not tissue or cotton balls. h dial antibacterial soap and water prior to dressing change. cleanser prior to applying a clean dressing using gauze sponges, not tissue or cotton balls. Peri-Wound Care Triamcinolone 15 (g) Discharge Instruction: Use triamcinolone 15 (g) as directed Zinc Oxide Ointment 30g tube Discharge Instruction: Apply Zinc Oxide to periwound with each dressing change Sween Lotion (Moisturizing lotion) Discharge Instruction: Apply moisturizing lotion as directed Topical Primary Dressing Hydrofera Blue Classic Foam, 4x4 in Discharge Instruction: Moisten with saline prior to applying to wound bed Secondary Dressing ABD Pad, 5x9 Discharge Instruction: Apply over primary dressing as directed. Zetuvit Plus 4x8 in Discharge Instruction: Apply over primary dressing as directed. CarboFLEX Odor Control Dressing, 4x4 in Discharge Instruction: Apply over primary dressing as  directed. Secured With Compression Wrap Unnaboot w/Calamine, 4x10 (in/yd) Discharge Instruction: Apply Unnaboot as directed. May also use Miliken CoFlex Calamine 2 layer compression system as alternative. Compression Stockings Add-Ons Electronic Signature(s) Signed: 05/31/2021 4:34:42 PM By: [name] Hammock RN Entered By: [name] on 05/30/2021 10:32:10 -------------------------------------------------------------------------------- Wound Assessment Details Patient Name: Date of Service: [name]. 05/30/2021 9:45 A M Medical Record Number: [id number] Patient Account Number: [id number] Date of Birth/Sex: Treating RN: 13-Jul-1950 (70 y.o. [name], [name] Primary Care Cinnamon Morency: [name] Other Clinician: Referring Aideen Fenster: Treating Dionte Blaustein/Extender: [name] in Treatment: 6 Wound Status Wound Number: 21 Primary Etiology: Diabetic Wound/Ulcer of the Lower Extremity Wound Location: Left, Medial Lower Leg Wound Status: Open Wounding Event: Gradually Appeared Date Acquired: 08/23/2020 Weeks Of Treatment: 6 Clustered Wound: No Wound Measurements Length: (cm) 0.7 Width: (cm) 0.4 Depth: (cm) 0.4 Area: (cm) 0.22 Volume: (cm) 0.088 % Reduction in Area: 92.5% % Reduction in Volume: 90% Wound Description Classification: Grade 2 Exudate Amount: Medium Exudate Type: Purulent Exudate Color: yellow, brown, green Treatment Notes Wound #21 (Lower Leg) Wound Laterality: Left, Medial Cleanser Normal Saline Discharge Instruction: Cleanse the wound with Normal Saline prior to applying a clean dressing using gauze sponges, not tissue or cotton balls. Soap and Water Discharge Instruction: May shower and wash wound with dial antibacterial soap and water prior to dressing change. Wound Cleanser Discharge Instruction: Cleanse the wound with wound cleanser prior to applying a clean dressing using gauze sponges, not tissue or cotton  balls. Peri-Wound Care Triamcinolone 15 (g) Discharge Instruction: Use triamcinolone 15 (g) as directed Zinc Oxide Ointment 30g tube Discharge Instruction: Apply Zinc Oxide to periwound with each dressing change Sween Lotion (Moisturizing lotion) Discharge Instruction: Apply moisturizing lotion as directed Topical Primary Dressing Hydrofera Blue Classic Foam, 4x4 in Discharge Instruction: Moisten with saline prior to applying to wound bed Secondary Dressing ABD Pad, 5x9 Discharge Instruction: Apply over primary dressing as directed. Zetuvit Plus 4x8 in Discharge Instruction: Apply over primary dressing as directed. CarboFLEX Odor Control Dressing, 4x4 in Discharge Instruction: Apply over primary dressing as directed. Secured With Compression Wrap Unnaboot w/Calamine, 4x10 (in/yd) Discharge Instruction: Apply Unnaboot as directed. May also use Miliken CoFlex Calamine 2 layer compression system as alternative. Compression Stockings Add-Ons Electronic Signature(s) Signed: 05/31/2021 4:34:42 PM By: [name] Hammock RN Entered By: [name] on 05/30/2021 10:32:10 -------------------------------------------------------------------------------- [place] Details Patient Name: Date of Service: [name]. 05/30/2021 9:45 A M Medical Record Number: [id number] Patient Account Number: [id number] Date of Birth/Sex: Treating RN: 11-Jan-1951 (70 y.o. [name], [name] Primary Care Provider: Jilda [name] Other Clinician: Referring Provider: Treating Provider/Extender: [name] in Treatment: 6 Vital Signs Time Taken: 10:29 Temperature (F): 98.6 Height (in): 74 Pulse (bpm): 77 Weight (lbs): 238 Respiratory Rate (breaths/min): 17 Body Mass Index (BMI): 30.6 Blood Pressure (mmHg): 147/77 Capillary Blood Glucose (mg/dl): 138 Reference Range: 80 - 120 mg / dl Electronic Signature(s) Signed: 05/31/2021 4:34:42 PM By: [name] Hammock RN Entered  By: [name] on 05/30/2021 10:31:35

## 2021-06-01 ENCOUNTER — Encounter (HOSPITAL_BASED_OUTPATIENT_CLINIC_OR_DEPARTMENT_OTHER): Payer: BC Managed Care – PPO | Admitting: Internal Medicine

## 2021-06-01 ENCOUNTER — Encounter (HOSPITAL_COMMUNITY): Payer: Self-pay

## 2021-06-01 ENCOUNTER — Other Ambulatory Visit: Payer: Self-pay | Admitting: *Deleted

## 2021-06-01 DIAGNOSIS — I739 Peripheral vascular disease, unspecified: Secondary | ICD-10-CM

## 2021-06-01 LAB — BUN+CREAT
BUN/Creatinine Ratio: 15 (ref 10–24)
BUN: 24 mg/dL (ref 8–27)
Creatinine, Ser: 1.58 mg/dL — ABNORMAL HIGH (ref 0.76–1.27)
eGFR: 47 mL/min/{1.73_m2} — ABNORMAL LOW (ref >59)

## 2021-06-01 NOTE — Progress Notes (Signed)
[name] Legal Sex  Male DOB  1951-06-19 SSN  [id number] Address  [place] [address] Phone  [phone] (Home)  [phone] (Mobile) *Preferred*    RE: [place] FINE NEEDLE ASP 1ST LESION Received: Today Criselda Peaches, MD  Valli Glance; P Ir Procedure Requests Approved for US guided aspiration with possible drain placement.   HKM        Previous Messages   ----- Message -----  From: Valli Glance  Sent: 06/01/2021  10:00 AM EST  To: Ir Procedure Requests  Subject: FW: [place] FINE NEEDLE ASP 1ST LESION               Still Pending Review  ----- Message -----  From: Valli Glance  Sent: 05/29/2021   6:05 PM EST  To: Ir Procedure Requests  Subject: [place] FINE NEEDLE ASP 1ST LESION                   [place] FINE NEEDLE ASP 1ST LESION      Reason: possible hematoma or abscess of left calf       History: US done 12/05       Provider: Ricard Dillon       Contact: [phone]

## 2021-06-02 ENCOUNTER — Ambulatory Visit (INDEPENDENT_AMBULATORY_CARE_PROVIDER_SITE_OTHER): Payer: BC Managed Care – PPO | Admitting: Vascular Surgery

## 2021-06-02 ENCOUNTER — Ambulatory Visit (HOSPITAL_COMMUNITY)
Admission: RE | Admit: 2021-06-02 | Discharge: 2021-06-02 | Disposition: A | Discharge status: Home / Self Care | Payer: BC Managed Care – PPO | Source: Ambulatory Visit | Attending: Vascular Surgery | Admitting: Vascular Surgery

## 2021-06-02 ENCOUNTER — Other Ambulatory Visit: Payer: Self-pay

## 2021-06-02 DIAGNOSIS — M79662 Pain in left lower leg: Secondary | ICD-10-CM | POA: Diagnosis not present

## 2021-06-02 DIAGNOSIS — I739 Peripheral vascular disease, unspecified: Secondary | ICD-10-CM

## 2021-06-02 DIAGNOSIS — M7989 Other specified soft tissue disorders: Secondary | ICD-10-CM

## 2021-06-02 MED ORDER — IOHEXOL 350 MG/ML SOLN
100.0000 mL | Freq: Once | INTRAVENOUS | Status: AC | PRN
  Administered 2021-06-02: 100 mL via INTRAVENOUS

## 2021-06-02 MED ORDER — SODIUM CHLORIDE (PF) 0.9 % IJ SOLN
INTRAMUSCULAR | Status: AC
  Filled 2021-06-02: qty 100
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
## 2021-06-05 ENCOUNTER — Encounter (HOSPITAL_BASED_OUTPATIENT_CLINIC_OR_DEPARTMENT_OTHER): Payer: BC Managed Care – PPO | Admitting: Internal Medicine

## 2021-06-05 ENCOUNTER — Other Ambulatory Visit: Payer: Self-pay

## 2021-06-05 ENCOUNTER — Encounter (HOSPITAL_COMMUNITY): Payer: Self-pay | Admitting: Vascular Surgery

## 2021-06-05 ENCOUNTER — Encounter: Payer: Self-pay | Admitting: Vascular Surgery

## 2021-06-05 NOTE — Anesthesia Preprocedure Evaluation (Addendum)
Anesthesia Evaluation  Patient identified by MRN, date of birth, ID band Patient awake    Reviewed: Allergy & Precautions, NPO status , Patient's Chart, lab work & pertinent test results  Airway Mallampati: III  TM Distance: >3 FB Neck ROM: Full    Dental  (+) Dental Advisory Given, Partial Lower, Upper Dentures   Pulmonary sleep apnea and Continuous Positive Airway Pressure Ventilation , former smoker,    Pulmonary exam normal breath sounds clear to auscultation       Cardiovascular hypertension, Pt. on medications + Peripheral Vascular Disease  Normal cardiovascular exam Rhythm:Regular Rate:Normal     Neuro/Psych PSYCHIATRIC DISORDERS Depression  Neuromuscular disease    GI/Hepatic negative GI ROS, Neg liver ROS,   Endo/Other  diabetes, Type 2, Insulin DependentObesity   Renal/GU Renal InsufficiencyRenal disease     Musculoskeletal  (+) Arthritis , fluid collection left lower extremity   Abdominal   Peds  Hematology  (+) Blood dyscrasia (Plavix), anemia ,   Anesthesia Other Findings   Reproductive/Obstetrics                           Anesthesia Physical Anesthesia Plan  ASA: 3  Anesthesia Plan: General   Post-op Pain Management: Tylenol PO (pre-op)   Induction: Intravenous  PONV Risk Score and Plan: 2 and Dexamethasone, Ondansetron and Treatment may vary due to age or medical condition  Airway Management Planned: LMA  Additional Equipment:   Intra-op Plan:   Post-operative Plan: Extubation in OR  Informed Consent: I have reviewed the patients History and Physical, chart, labs and discussed the procedure including the risks, benefits and alternatives for the proposed anesthesia with the patient or authorized representative who has indicated his/her understanding and acceptance.     Dental advisory given  Plan Discussed with: CRNA  Anesthesia Plan Comments: (Nuclear  stress test (05/13/2017) (Dr. Irven Shelling office):  1. The resting electrocardiogram demonstrated normal sinus rhythm, normal resting conduction and no resting arrhythmias. Stress EKG is non-diagnostic for ischemia as it a pharmacologic stress using Lexiscan. Stress symptoms included dyspnea, nausea, dizziness. 2. Myocardial perfusion imaging is normal. Overall left ventricular systolic function was normal without regional wall motion abnormalities. The left ventricular ejection fraction was normal visually but mildly depressed at 45%. This is a low risk study.  Echocardiogram (05/23/2017) (Dr. Irven Shelling office):  Left ventricle cavity is normal in size. Moderate concentric hypertrophy of the left ventricle. Normal global wall motion. Doppler evidence of grade I (impaired) diastolic dysfunction, normal LAP. Calculated EF 55%. No significant valvular abnormalities. No evidence of pulmonary hypertension.)      Anesthesia Quick Evaluation

## 2021-06-05 NOTE — Progress Notes (Signed)
SIDHANT, HELDERMAN ([id number]) Visit Report for 06/05/2021 SuperBill Details Patient Name: Date of Service: [name], [name] 06/05/2021 Medical Record Number: [id number] Patient Account Number: [id number] Date of Birth/Sex: Treating RN: 18-Apr-1951 (70 y.o. [name] Primary Care Provider: [name] Other Clinician: Referring Provider: Treating Provider/Extender: Cornelious Bryant in Treatment: 7 Diagnosis Coding ICD-10 Codes Code Description E11.621 Type 2 diabetes mellitus with foot ulcer 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 I89.0 Lymphedema, not elsewhere classified I87.322 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verity 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 of other part of left foot with other specified severity E11.42 Type 2 diabetes mellitus with diabetic polyneuropathy Facility Procedures CPT4 Code Description Modifier Quantity [id number] (Facility Use Only) 29580LT - Dorise Bullion BOOT LT 1 Electronic Signature(s) Signed: 06/05/2021 4:53:33 PM By: Baruch Gouty RN, BSN Signed: 06/05/2021 5:11:58 PM By: Kalman Shan DO Entered By: Baruch Gouty on 06/05/2021 12:23:22

## 2021-06-05 NOTE — Progress Notes (Addendum)
DUE TO COVID-19 ONLY ONE VISITOR IS ALLOWED TO COME WITH YOU AND STAY IN THE WAITING ROOM ONLY DURING PRE OP AND PROCEDURE DAY OF SURGERY.   PCP/Internal Med - Dr [name] Cardiologist - Dr Adrian Prows  Chest x-ray - n/a EKG - 03/27/21 Stress Test - 05/13/17 ECHO - 12/02/12 Cardiac Cath - n/a  ICD Pacemaker/Loop - n/a  Sleep Study -  Yes CPAP - uses CPAP nightly  Patient has V-GO 30 Insulin Disposable Patch Pump in place.   Surgery scheduled for only 30 minutes.  Penni Bombard, Diabetes coordinator informed.  Patient does not have to take off insulin patch.  If your blood sugar is less than 70 mg/dL, you will need to treat for low blood sugar: Treat a low blood sugar (less than 70 mg/dL) with  cup of clear juice (cranberry or apple), 4 glucose tablets, OR glucose gel. Recheck blood sugar in 15 minutes after treatment (to make sure it is greater than 70 mg/dL). If your blood sugar is not greater than 70 mg/dL on recheck, call [phone] for further instructions.  Blood Thinner Instructions:  Follow your surgeon's instructions on when to stop Plavix prior to surgery.  Anesthesia review: Yes  STOP now taking any Aspirin (unless otherwise instructed by your surgeon), Aleve, Naproxen, Ibuprofen, Motrin, Advil, Goody's, BC's, all herbal medications, fish oil, and all vitamins.   Coronavirus Screening Covid test n/a Ambulatory Surgery  Do you have any of the following symptoms:  Cough yes/no: No Fever (>100.73F)  yes/no: No Runny nose yes/no: No Sore throat yes/no: No Difficulty breathing/shortness of breath  yes/no: No  Have you traveled in the last 14 days and where? yes/no: No  Patient verbalized understanding of instructions that were given via phone.

## 2021-06-05 NOTE — Progress Notes (Signed)
TAM, DELISLE ([id number]) Visit Report for 06/05/2021 Arrival Information Details Patient Name: Date of Service: [name] 06/05/2021 11:15 A M Medical Record Number: [id number] Patient Account Number: [id number] Date of Birth/Sex: Treating RN: February 01, 1951 (70 y.o. [name], [name] Primary Care Lebaron Bautch: [name] Other Clinician: Referring Mars Scheaffer: Treating Burdett Pinzon/Extender: Cornelious Bryant in Treatment: 7 Visit Information History Since Last Visit Added or deleted any medications: No Patient Arrived: Ambulatory Any new allergies or adverse reactions: No Arrival Time: 11:45 Had a fall or experienced change in No Accompanied By: self activities of daily living that may affect Transfer Assistance: None risk of falls: Patient Identification Verified: Yes Signs or symptoms of abuse/neglect since last visito No Secondary Verification Process Completed: Yes Hospitalized since last visit: No Patient Requires Transmission-Based Precautions: No Implantable device outside of the clinic excluding No Patient Has Alerts: Yes cellular tissue based products placed in the center Patient Alerts: ABI's: 09/22 L:1.09 R: 1. since last visit: TBI's: R:0.72 L:0.99 Has Dressing in Place as Prescribed: Yes Has Compression in Place as Prescribed: Yes Pain Present Now: Yes Electronic Signature(s) Signed: 06/05/2021 4:53:33 PM By: Baruch Gouty RN, BSN Entered By: Baruch Gouty on 06/05/2021 11:46:29 -------------------------------------------------------------------------------- Compression Therapy Details Patient Name: Date of Service: [name]. 06/05/2021 11:15 A M Medical Record Number: [id number] Patient Account Number: [id number] Date of Birth/Sex: Treating RN: Apr 17, 1951 (70 y.o. [name] Primary Care Chantille Navarrete: [name] Other Clinician: Referring Birgit Nowling: Treating Shjon Lizarraga/Extender: Cornelious Bryant in  Treatment: 7 Compression Therapy Performed for Wound Assessment: Wound #19 Left,Anterior Lower Leg Performed By: Clinician Baruch Gouty, RN Compression Type: Rolena Infante Electronic Signature(s) Signed: 06/05/2021 4:53:33 PM By: Baruch Gouty RN, BSN Entered By: Baruch Gouty on 06/05/2021 12:21:36 -------------------------------------------------------------------------------- Encounter Discharge Information Details Patient Name: Date of Service: [name]. 06/05/2021 11:15 A M Medical Record Number: [id number] Patient Account Number: [id number] Date of Birth/Sex: Treating RN: [birth year] (70 y.o. [name] Primary Care Goldye Tourangeau: [name] Other Clinician: Referring Ermin Parisien: Treating Havyn Ramo/Extender: Cornelious Bryant in Treatment: 7 Encounter Discharge Information Items Discharge Condition: Stable Ambulatory Status: Ambulatory Discharge Destination: Home Transportation: Private Auto Accompanied By: self Schedule Follow-up Appointment: Yes Clinical Summary of Care: Patient Declined Electronic Signature(s) Signed: 06/05/2021 4:53:33 PM By: Baruch Gouty RN, BSN Entered By: Baruch Gouty on 06/05/2021 12:22:31 -------------------------------------------------------------------------------- Patient/Caregiver Education Details Patient Name: Date of Service: [name] 12/12/2022andnbsp11:15 A M Medical Record Number: [id number] Patient Account Number: [id number] Date of Birth/Gender: Treating RN: [birth year] (70 y.o. [name] Primary Care Physician: [name] Other Clinician: Referring Physician: Treating Physician/Extender: Cornelious Bryant in Treatment: 7 Education Assessment Education Provided To: Patient Education Topics Provided Elevated Blood Sugar/ Impact on Healing: Methods: Explain/Verbal Responses: Reinforcements needed, State content correctly Venous: Methods:  Explain/Verbal Responses: Reinforcements needed, State content correctly Electronic Signature(s) Signed: 06/05/2021 4:53:33 PM By: Baruch Gouty RN, BSN Entered By: Baruch Gouty on 06/05/2021 12:22:19 -------------------------------------------------------------------------------- Wound Assessment Details Patient Name: Date of Service: [name]. 06/05/2021 11:15 A M Medical Record Number: [id number] Patient Account Number: [id number] Date of Birth/Sex: Treating RN: 1951-04-25 (70 y.o. [name] Primary Care Tytianna Greenley: [name] Other Clinician: Referring Maceo Hernan: Treating Park Beck/Extender: Cornelious Bryant in Treatment: 7 Wound Status Wound Number: 18 Primary Diabetic Wound/Ulcer of the Lower Extremity Etiology: Wound Location: Left, Plantar Metatarsal head first Wound Open Wounding Event: Gradually Appeared Status: Date Acquired: 08/23/2020 Comorbid Glaucoma, Sleep Apnea,  Hypertension, Peripheral Arterial Disease, Weeks Of Treatment: 7 History: Peripheral Venous Disease, Type II Diabetes, Gout, Osteoarthritis, Clustered Wound: No Neuropathy Wound Measurements Length: (cm) 2.9 Width: (cm) 3.7 Depth: (cm) 0.2 Area: (cm) 8.427 Volume: (cm) 1.685 % Reduction in Area: 40.4% % Reduction in Volume: 40.4% Epithelialization: Small (1-33%) Tunneling: No Undermining: No Wound Description Classification: Grade 2 Wound Margin: Distinct, outline attached Exudate Amount: Medium Exudate Type: Serosanguineous Exudate Color: red, brown Foul Odor After Cleansing: No Slough/Fibrino No Wound Bed Granulation Amount: Large (67-100%) Exposed Structure Granulation Quality: Red Fascia Exposed: No Necrotic Amount: None Present (0%) Fat Layer (Subcutaneous Tissue) Exposed: Yes Tendon Exposed: No Muscle Exposed: No Joint Exposed: No Bone Exposed: No Treatment Notes Wound #18 (Metatarsal head first) Wound Laterality: Plantar,  Left Cleanser Normal Saline Discharge Instruction: Cleanse the wound with Normal Saline prior to applying a clean dressing using gauze sponges, not tissue or cotton balls. Soap and Water Discharge Instruction: May shower and wash wound with dial antibacterial soap and water prior to dressing change. Wound Cleanser Discharge Instruction: Cleanse the wound with wound cleanser prior to applying a clean dressing using gauze sponges, not tissue or cotton balls. Peri-Wound Care Triamcinolone 15 (g) Discharge Instruction: Use triamcinolone 15 (g) as directed Zinc Oxide Ointment 30g tube Discharge Instruction: Apply Zinc Oxide to periwound with each dressing change Sween Lotion (Moisturizing lotion) Discharge Instruction: Apply moisturizing lotion as directed Topical Primary Dressing Hydrofera Blue Classic Foam, 4x4 in Discharge Instruction: Moisten with saline prior to applying to wound bed Secondary Dressing ABD Pad, 5x9 Discharge Instruction: Apply over primary dressing as directed. Zetuvit Plus 4x8 in Discharge Instruction: Apply over primary dressing as directed. CarboFLEX Odor Control Dressing, 4x4 in Discharge Instruction: Apply over primary dressing as directed. Secured With Compression Wrap Unnaboot w/Calamine, 4x10 (in/yd) Discharge Instruction: Apply Unnaboot as directed. May also use Miliken CoFlex Calamine 2 layer compression system as alternative. Compression Stockings Add-Ons Electronic Signature(s) Signed: 06/05/2021 4:53:33 PM By: Baruch Gouty RN, BSN Entered By: Baruch Gouty on 06/05/2021 12:20:24 -------------------------------------------------------------------------------- Wound Assessment Details Patient Name: Date of Service: [name]. 06/05/2021 11:15 A M Medical Record Number: [id number] Patient Account Number: [id number] Date of Birth/Sex: Treating RN: 1950/09/17 (70 y.o. [name] Primary Care Marlenne Ridge: [name] Other  Clinician: Referring Panda Crossin: Treating Jeily Guthridge/Extender: Cornelious Bryant in Treatment: 7 Wound Status Wound Number: 19 Primary Diabetic Wound/Ulcer of the Lower Extremity Etiology: Wound Location: Left, Anterior Lower Leg Wound Open Wounding Event: Gradually Appeared Status: Date Acquired: 08/23/2020 Comorbid Glaucoma, Sleep Apnea, Hypertension, Peripheral Arterial Disease, Weeks Of Treatment: 7 History: Peripheral Venous Disease, Type II Diabetes, Gout, Osteoarthritis, Clustered Wound: No Neuropathy Wound Measurements Length: (cm) 4.3 Width: (cm) 3.4 Depth: (cm) 0.2 Area: (cm) 11.483 Volume: (cm) 2.297 % Reduction in Area: 20.5% % Reduction in Volume: 47% Epithelialization: Small (1-33%) Tunneling: No Undermining: No Wound Description Classification: Grade 2 Wound Margin: Flat and Intact Exudate Amount: Medium Exudate Type: Serosanguineous Exudate Color: red, brown Foul Odor After Cleansing: No Slough/Fibrino No Wound Bed Granulation Amount: Large (67-100%) Exposed Structure Granulation Quality: Red Fascia Exposed: No Necrotic Amount: None Present (0%) Fat Layer (Subcutaneous Tissue) Exposed: Yes Tendon Exposed: No Muscle Exposed: No Joint Exposed: No Bone Exposed: No Treatment Notes Wound #19 (Lower Leg) Wound Laterality: Left, Anterior Cleanser Normal Saline Discharge Instruction: Cleanse the wound with Normal Saline prior to applying a clean dressing using gauze sponges, not tissue or cotton balls. Soap and Water Discharge Instruction: May shower and wash wound with  dial antibacterial soap and water prior to dressing change. Wound Cleanser Discharge Instruction: Cleanse the wound with wound cleanser prior to applying a clean dressing using gauze sponges, not tissue or cotton balls. Peri-Wound Care Triamcinolone 15 (g) Discharge Instruction: Use triamcinolone 15 (g) as directed Zinc Oxide Ointment 30g tube Discharge Instruction:  Apply Zinc Oxide to periwound with each dressing change Sween Lotion (Moisturizing lotion) Discharge Instruction: Apply moisturizing lotion as directed Topical Primary Dressing Hydrofera Blue Classic Foam, 4x4 in Discharge Instruction: Moisten with saline prior to applying to wound bed Secondary Dressing ABD Pad, 5x9 Discharge Instruction: Apply over primary dressing as directed. Zetuvit Plus 4x8 in Discharge Instruction: Apply over primary dressing as directed. CarboFLEX Odor Control Dressing, 4x4 in Discharge Instruction: Apply over primary dressing as directed. Secured With Compression Wrap Unnaboot w/Calamine, 4x10 (in/yd) Discharge Instruction: Apply Unnaboot as directed. May also use Miliken CoFlex Calamine 2 layer compression system as alternative. Compression Stockings Add-Ons Electronic Signature(s) Signed: 06/05/2021 4:53:33 PM By: Baruch Gouty RN, BSN Entered By: Baruch Gouty on 06/05/2021 12:20:52 -------------------------------------------------------------------------------- Wound Assessment Details Patient Name: Date of Service: [name]. 06/05/2021 11:15 A M Medical Record Number: [id number] Patient Account Number: [id number] Date of Birth/Sex: Treating RN: 1950/10/01 (70 y.o. [name] Primary Care Adelyna Brockman: [name] Other Clinician: Referring Silvie Obremski: Treating Zalaya Astarita/Extender: Cornelious Bryant in Treatment: 7 Wound Status Wound Number: 21 Primary Diabetic Wound/Ulcer of the Lower Extremity Etiology: Wound Location: Left, Medial Lower Leg Wound Open Wounding Event: Gradually Appeared Status: Date Acquired: 08/23/2020 Comorbid Glaucoma, Sleep Apnea, Hypertension, Peripheral Arterial Disease, Weeks Of Treatment: 7 History: Peripheral Venous Disease, Type II Diabetes, Gout, Osteoarthritis, Clustered Wound: No Neuropathy Wound Measurements Length: (cm) 0.7 Width: (cm) 0.4 Depth: (cm) 0.4 Area: (cm)  0.22 Volume: (cm) 0.088 % Reduction in Area: 92.5% % Reduction in Volume: 90% Epithelialization: Large (67-100%) Tunneling: No Undermining: No Wound Description Classification: Grade 2 Exudate Amount: Medium Exudate Type: Serosanguineous Exudate Color: red, brown Wound Bed Granulation Amount: Large (67-100%) Granulation Quality: Red Necrotic Amount: None Present (0%) Exposed Structure Fascia Exposed: No Fat Layer (Subcutaneous Tissue) Exposed: Yes Tendon Exposed: No Muscle Exposed: No Joint Exposed: No Bone Exposed: No Treatment Notes Wound #21 (Lower Leg) Wound Laterality: Left, Medial Cleanser Normal Saline Discharge Instruction: Cleanse the wound with Normal Saline prior to applying a clean dressing using gauze sponges, not tissue or cotton balls. Soap and Water Discharge Instruction: May shower and wash wound with dial antibacterial soap and water prior to dressing change. Wound Cleanser Discharge Instruction: Cleanse the wound with wound cleanser prior to applying a clean dressing using gauze sponges, not tissue or cotton balls. Peri-Wound Care Triamcinolone 15 (g) Discharge Instruction: Use triamcinolone 15 (g) as directed Zinc Oxide Ointment 30g tube Discharge Instruction: Apply Zinc Oxide to periwound with each dressing change Sween Lotion (Moisturizing lotion) Discharge Instruction: Apply moisturizing lotion as directed Topical Primary Dressing Hydrofera Blue Classic Foam, 4x4 in Discharge Instruction: Moisten with saline prior to applying to wound bed Secondary Dressing ABD Pad, 5x9 Discharge Instruction: Apply over primary dressing as directed. Zetuvit Plus 4x8 in Discharge Instruction: Apply over primary dressing as directed. CarboFLEX Odor Control Dressing, 4x4 in Discharge Instruction: Apply over primary dressing as directed. Secured With Compression Wrap Unnaboot w/Calamine, 4x10 (in/yd) Discharge Instruction: Apply Unnaboot as directed. May also  use Miliken CoFlex Calamine 2 layer compression system as alternative. Compression Stockings Add-Ons Electronic Signature(s) Signed: 06/05/2021 4:53:33 PM By: Baruch Gouty RN, BSN Entered By: Baruch Gouty on  06/05/2021 12:21:14 -------------------------------------------------------------------------------- Wound Assessment Details Patient Name: Date of Service: [name], MARLETTE 06/05/2021 11:15 A M Medical Record Number: [id number] Patient Account Number: [id number] Date of Birth/Sex: Treating RN: 09-Jan-1951 (70 y.o. [name] Primary Care Janelle Culton: [name] Other Clinician: Referring Annarae Macnair: Treating Jimia Gentles/Extender: Cornelious Bryant in Treatment: 7 Wound Status Wound Number: 22 Primary Cyst Etiology: Wound Location: Left, Lateral Lower Leg Wound Open Wounding Event: Bump Status: Date Acquired: 06/03/2021 Comorbid Glaucoma, Sleep Apnea, Hypertension, Peripheral Arterial Disease, Weeks Of Treatment: 0 History: Peripheral Venous Disease, Type II Diabetes, Gout, Osteoarthritis, Clustered Wound: No Neuropathy Photos Wound Measurements Length: (cm) 1.4 Width: (cm) 1.5 Depth: (cm) 0.8 Area: (cm) 1.649 Volume: (cm) 1.319 % Reduction in Area: 0% % Reduction in Volume: 0% Epithelialization: Small (1-33%) Tunneling: No Wound Description Classification: Full Thickness Without Exposed Support Structures Wound Margin: Well defined, not attached Exudate Amount: Large Exudate Type: Serous Exudate Color: amber Foul Odor After Cleansing: No Slough/Fibrino No Wound Bed Granulation Amount: Large (67-100%) Exposed Structure Granulation Quality: Red Fascia Exposed: No Necrotic Amount: Small (1-33%) Fat Layer (Subcutaneous Tissue) Exposed: Yes Necrotic Quality: Adherent Slough Tendon Exposed: No Muscle Exposed: No Joint Exposed: No Bone Exposed: No Assessment Notes large amount of clear yellow drainage expressed from cyst opening,  lateral leg macerated from saturated dressing Treatment Notes Wound #22 (Lower Leg) Wound Laterality: Left, Lateral Cleanser Peri-Wound Care Topical Primary Dressing Secondary Dressing Secured With Compression Wrap Compression Stockings Add-Ons Electronic Signature(s) Signed: 06/05/2021 4:27:43 PM By: Valeria Batman EMT Signed: 06/05/2021 4:53:33 PM By: Baruch Gouty RN, BSN Signed: 06/05/2021 4:53:33 PM By: Baruch Gouty RN, BSN Entered By: Valeria Batman on 06/05/2021 16:27:42 -------------------------------------------------------------------------------- Vitals Details Patient Name: Date of Service: [name]. 06/05/2021 11:15 A M Medical Record Number: [id number] Patient Account Number: [id number] Date of Birth/Sex: Treating RN: 1951-03-10 (70 y.o. [name] Primary Care Aliyanah Rozas: [name] Other Clinician: Referring Abdulraheem Pineo: Treating Ameila Weldon/Extender: Cornelious Bryant in Treatment: 7 Vital Signs Time Taken: 11:46 Temperature (F): 98.2 Height (in): 74 Pulse (bpm): 77 Source: Stated Respiratory Rate (breaths/min): 18 Weight (lbs): 238 Blood Pressure (mmHg): 147/83 Source: Stated Capillary Blood Glucose (mg/dl): 240 Body Mass Index (BMI): 30.6 Reference Range: 80 - 120 mg / dl Notes glucose per pt report this am Electronic Signature(s) Signed: 06/05/2021 4:53:33 PM By: Baruch Gouty RN, BSN Entered By: Baruch Gouty on 06/05/2021 11:47:25

## 2021-06-06 ENCOUNTER — Encounter (HOSPITAL_COMMUNITY)
Admission: AD | Disposition: A | Discharge status: Home / Self Care | Payer: Self-pay | Source: Home / Self Care | Attending: Vascular Surgery

## 2021-06-06 ENCOUNTER — Inpatient Hospital Stay (HOSPITAL_COMMUNITY)
Admission: AD | Admit: 2021-06-06 | Discharge: 2021-06-12 | DRG: 603 | Disposition: A | Discharge status: Home / Self Care | Payer: BC Managed Care – PPO | Attending: Vascular Surgery | Admitting: Vascular Surgery

## 2021-06-06 ENCOUNTER — Ambulatory Visit (HOSPITAL_COMMUNITY): Payer: BC Managed Care – PPO | Admitting: Physician Assistant

## 2021-06-06 ENCOUNTER — Encounter (HOSPITAL_COMMUNITY): Payer: Self-pay | Admitting: Vascular Surgery

## 2021-06-06 ENCOUNTER — Other Ambulatory Visit: Payer: Self-pay

## 2021-06-06 DIAGNOSIS — E1165 Type 2 diabetes mellitus with hyperglycemia: Secondary | ICD-10-CM | POA: Diagnosis present

## 2021-06-06 DIAGNOSIS — Z8614 Personal history of Methicillin resistant Staphylococcus aureus infection: Secondary | ICD-10-CM

## 2021-06-06 DIAGNOSIS — E1122 Type 2 diabetes mellitus with diabetic chronic kidney disease: Secondary | ICD-10-CM | POA: Diagnosis present

## 2021-06-06 DIAGNOSIS — I872 Venous insufficiency (chronic) (peripheral): Secondary | ICD-10-CM | POA: Diagnosis present

## 2021-06-06 DIAGNOSIS — Z8611 Personal history of tuberculosis: Secondary | ICD-10-CM

## 2021-06-06 DIAGNOSIS — R2242 Localized swelling, mass and lump, left lower limb: Secondary | ICD-10-CM | POA: Diagnosis not present

## 2021-06-06 DIAGNOSIS — G4733 Obstructive sleep apnea (adult) (pediatric): Secondary | ICD-10-CM | POA: Diagnosis present

## 2021-06-06 DIAGNOSIS — Z95828 Presence of other vascular implants and grafts: Secondary | ICD-10-CM

## 2021-06-06 DIAGNOSIS — I129 Hypertensive chronic kidney disease with stage 1 through stage 4 chronic kidney disease, or unspecified chronic kidney disease: Secondary | ICD-10-CM | POA: Diagnosis present

## 2021-06-06 DIAGNOSIS — Z20822 Contact with and (suspected) exposure to covid-19: Secondary | ICD-10-CM | POA: Diagnosis present

## 2021-06-06 DIAGNOSIS — B9561 Methicillin susceptible Staphylococcus aureus infection as the cause of diseases classified elsewhere: Secondary | ICD-10-CM | POA: Diagnosis not present

## 2021-06-06 DIAGNOSIS — L02416 Cutaneous abscess of left lower limb: Principal | ICD-10-CM | POA: Diagnosis present

## 2021-06-06 DIAGNOSIS — Z833 Family history of diabetes mellitus: Secondary | ICD-10-CM | POA: Diagnosis not present

## 2021-06-06 DIAGNOSIS — I739 Peripheral vascular disease, unspecified: Secondary | ICD-10-CM | POA: Diagnosis present

## 2021-06-06 DIAGNOSIS — M1990 Unspecified osteoarthritis, unspecified site: Secondary | ICD-10-CM | POA: Diagnosis present

## 2021-06-06 DIAGNOSIS — E1142 Type 2 diabetes mellitus with diabetic polyneuropathy: Secondary | ICD-10-CM | POA: Diagnosis not present

## 2021-06-06 DIAGNOSIS — Y831 Surgical operation with implant of artificial internal device as the cause of abnormal reaction of the patient, or of later complication, without mention of misadventure at the time of the procedure: Secondary | ICD-10-CM | POA: Diagnosis present

## 2021-06-06 DIAGNOSIS — N183 Chronic kidney disease, stage 3 unspecified: Secondary | ICD-10-CM | POA: Diagnosis present

## 2021-06-06 DIAGNOSIS — E7800 Pure hypercholesterolemia, unspecified: Secondary | ICD-10-CM | POA: Diagnosis present

## 2021-06-06 DIAGNOSIS — L97529 Non-pressure chronic ulcer of other part of left foot with unspecified severity: Secondary | ICD-10-CM | POA: Diagnosis present

## 2021-06-06 DIAGNOSIS — Z89422 Acquired absence of other left toe(s): Secondary | ICD-10-CM | POA: Diagnosis not present

## 2021-06-06 DIAGNOSIS — T82868A Thrombosis of vascular prosthetic devices, implants and grafts, initial encounter: Secondary | ICD-10-CM | POA: Diagnosis present

## 2021-06-06 DIAGNOSIS — E1151 Type 2 diabetes mellitus with diabetic peripheral angiopathy without gangrene: Secondary | ICD-10-CM | POA: Diagnosis present

## 2021-06-06 DIAGNOSIS — D631 Anemia in chronic kidney disease: Secondary | ICD-10-CM | POA: Diagnosis present

## 2021-06-06 DIAGNOSIS — Z9641 Presence of insulin pump (external) (internal): Secondary | ICD-10-CM | POA: Diagnosis present

## 2021-06-06 DIAGNOSIS — E11621 Type 2 diabetes mellitus with foot ulcer: Secondary | ICD-10-CM | POA: Diagnosis not present

## 2021-06-06 DIAGNOSIS — Z23 Encounter for immunization: Secondary | ICD-10-CM | POA: Diagnosis not present

## 2021-06-06 DIAGNOSIS — Z87891 Personal history of nicotine dependence: Secondary | ICD-10-CM

## 2021-06-06 DIAGNOSIS — Z794 Long term (current) use of insulin: Secondary | ICD-10-CM

## 2021-06-06 HISTORY — PX: INCISION AND DRAINAGE: SHX5863

## 2021-06-06 LAB — COMPREHENSIVE METABOLIC PANEL
ALT: 13 U/L (ref 0–44)
AST: 17 U/L (ref 15–41)
Albumin: 2.8 g/dL — ABNORMAL LOW (ref 3.5–5.0)
Alkaline Phosphatase: 94 U/L (ref 38–126)
Anion gap: 8 (ref 5–15)
BUN: 21 mg/dL (ref 8–23)
CO2: 25 mmol/L (ref 22–32)
Calcium: 8.8 mg/dL — ABNORMAL LOW (ref 8.9–10.3)
Chloride: 100 mmol/L (ref 98–111)
Creatinine, Ser: 1.64 mg/dL — ABNORMAL HIGH (ref 0.61–1.24)
GFR, Estimated: 45 mL/min — ABNORMAL LOW (ref >60)
Glucose, Bld: 245 mg/dL — ABNORMAL HIGH (ref 70–99)
Potassium: 4.9 mmol/L (ref 3.5–5.1)
Sodium: 133 mmol/L — ABNORMAL LOW (ref 135–145)
Total Bilirubin: 0.4 mg/dL (ref 0.3–1.2)
Total Protein: 7.5 g/dL (ref 6.5–8.1)

## 2021-06-06 LAB — POCT I-STAT, CHEM 8
BUN: 25 mg/dL — ABNORMAL HIGH (ref 8–23)
Calcium, Ion: 1.18 mmol/L (ref 1.15–1.40)
Chloride: 100 mmol/L (ref 98–111)
Creatinine, Ser: 1.6 mg/dL — ABNORMAL HIGH (ref 0.61–1.24)
Glucose, Bld: 231 mg/dL — ABNORMAL HIGH (ref 70–99)
HCT: 34 % — ABNORMAL LOW (ref 39.0–52.0)
Hemoglobin: 11.6 g/dL — ABNORMAL LOW (ref 13.0–17.0)
Potassium: 4.3 mmol/L (ref 3.5–5.1)
Sodium: 135 mmol/L (ref 135–145)
TCO2: 23 mmol/L (ref 22–32)

## 2021-06-06 LAB — GLUCOSE, CAPILLARY
Glucose-Capillary: 258 mg/dL — ABNORMAL HIGH (ref 70–99)
Glucose-Capillary: 187 mg/dL — ABNORMAL HIGH (ref 70–99)
Glucose-Capillary: 111 mg/dL — ABNORMAL HIGH (ref 70–99)
Glucose-Capillary: 100 mg/dL — ABNORMAL HIGH (ref 70–99)
Glucose-Capillary: 136 mg/dL — ABNORMAL HIGH (ref 70–99)
Glucose-Capillary: 323 mg/dL — ABNORMAL HIGH (ref 70–99)
Glucose-Capillary: 340 mg/dL — ABNORMAL HIGH (ref 70–99)

## 2021-06-06 LAB — CBC
HCT: 31.2 % — ABNORMAL LOW (ref 39.0–52.0)
Hemoglobin: 9.8 g/dL — ABNORMAL LOW (ref 13.0–17.0)
MCH: 24 pg — ABNORMAL LOW (ref 26.0–34.0)
MCHC: 31.4 g/dL (ref 30.0–36.0)
MCV: 76.3 fL — ABNORMAL LOW (ref 80.0–100.0)
Platelets: 291 10*3/uL (ref 150–400)
RBC: 4.09 MIL/uL — ABNORMAL LOW (ref 4.22–5.81)
RDW: 19.1 % — ABNORMAL HIGH (ref 11.5–15.5)
WBC: 9.5 10*3/uL (ref 4.0–10.5)
nRBC: 0 % (ref 0.0–0.2)

## 2021-06-06 LAB — SURGICAL PCR SCREEN
MRSA, PCR: POSITIVE — AB
Staphylococcus aureus: POSITIVE — AB

## 2021-06-06 LAB — HEMOGLOBIN A1C
Hgb A1c MFr Bld: 11 % — ABNORMAL HIGH (ref 4.8–5.6)
Mean Plasma Glucose: 269 mg/dL

## 2021-06-06 LAB — HIV ANTIBODY (ROUTINE TESTING W REFLEX): HIV Screen 4th Generation wRfx: NONREACTIVE

## 2021-06-06 SURGERY — INCISION AND DRAINAGE
Anesthesia: General | Site: Leg Lower | Laterality: Left

## 2021-06-06 MED ORDER — ORAL CARE MOUTH RINSE: 15.0000 mL | Freq: Once | OROMUCOSAL | Status: AC

## 2021-06-06 MED ORDER — SODIUM CHLORIDE 0.9 % IV SOLN: INTRAVENOUS | Status: DC

## 2021-06-06 MED ORDER — COLCHICINE 0.6 MG PO TABS
0.6000 mg | ORAL_TABLET | Freq: Every day | ORAL | Status: DC
  Administered 2021-06-06 – 2021-06-11 (×6): 0.6 mg via ORAL
  Filled 2021-06-06 (×6): qty 1

## 2021-06-06 MED ORDER — INSULIN ASPART 100 UNIT/ML IJ SOLN
0.0000 [IU] | Freq: Three times a day (TID) | INTRAMUSCULAR | Status: DC
  Administered 2021-06-06: 1 [IU] via SUBCUTANEOUS
  Administered 2021-06-06: 7 [IU] via SUBCUTANEOUS
  Administered 2021-06-07: 12:00:00 4 [IU] via SUBCUTANEOUS
  Administered 2021-06-07: 07:00:00 9 [IU] via SUBCUTANEOUS
  Filled 2021-06-06: qty 0.09

## 2021-06-06 MED ORDER — TRAMADOL HCL 50 MG PO TABS: 50.0000 mg | ORAL_TABLET | Freq: Every evening | ORAL | Status: DC | PRN

## 2021-06-06 MED ORDER — OXYCODONE HCL 5 MG PO TABS
ORAL_TABLET | ORAL | Status: AC
  Filled 2021-06-06: qty 2

## 2021-06-06 MED ORDER — PHENYLEPHRINE HCL (PRESSORS) 10 MG/ML IV SOLN
INTRAVENOUS | Status: DC | PRN
  Administered 2021-06-06: 80 ug via INTRAVENOUS
  Administered 2021-06-06: 120 ug via INTRAVENOUS

## 2021-06-06 MED ORDER — INSULIN ASPART 100 UNIT/ML IJ SOLN
INTRAMUSCULAR | Status: AC
  Filled 2021-06-06: qty 1

## 2021-06-06 MED ORDER — HYDROMORPHONE HCL 1 MG/ML IJ SOLN
0.2500 mg | INTRAMUSCULAR | Status: DC | PRN
  Administered 2021-06-06: 0.25 mg via INTRAVENOUS

## 2021-06-06 MED ORDER — CLOPIDOGREL BISULFATE 75 MG PO TABS
75.0000 mg | ORAL_TABLET | Freq: Every day | ORAL | Status: DC
  Administered 2021-06-06 – 2021-06-12 (×7): 75 mg via ORAL
  Filled 2021-06-06 (×7): qty 1

## 2021-06-06 MED ORDER — HYDROMORPHONE HCL 1 MG/ML IJ SOLN
INTRAMUSCULAR | Status: AC
  Filled 2021-06-06: qty 1

## 2021-06-06 MED ORDER — LACTATED RINGERS IV SOLN: INTRAVENOUS | Status: DC

## 2021-06-06 MED ORDER — HEPARIN SODIUM (PORCINE) 5000 UNIT/ML IJ SOLN
5000.0000 [IU] | Freq: Three times a day (TID) | INTRAMUSCULAR | Status: DC
  Administered 2021-06-06 – 2021-06-12 (×17): 5000 [IU] via SUBCUTANEOUS
  Filled 2021-06-06 (×15): qty 1

## 2021-06-06 MED ORDER — CHLORHEXIDINE GLUCONATE 4 % EX LIQD: 60.0000 mL | Freq: Once | CUTANEOUS | Status: DC

## 2021-06-06 MED ORDER — LATANOPROST 0.005 % OP SOLN
1.0000 [drp] | Freq: Every day | OPHTHALMIC | Status: DC
  Administered 2021-06-06 – 2021-06-11 (×6): 1 [drp] via OPHTHALMIC
  Filled 2021-06-06: qty 2.5

## 2021-06-06 MED ORDER — PROPOFOL 10 MG/ML IV BOLUS
INTRAVENOUS | Status: AC
  Filled 2021-06-06: qty 20

## 2021-06-06 MED ORDER — POTASSIUM CHLORIDE CRYS ER 20 MEQ PO TBCR: 20.0000 meq | EXTENDED_RELEASE_TABLET | Freq: Once | ORAL | Status: DC

## 2021-06-06 MED ORDER — INSULIN DETEMIR 100 UNIT/ML ~~LOC~~ SOLN
12.0000 [IU] | Freq: Every day | SUBCUTANEOUS | Status: DC
  Administered 2021-06-06 – 2021-06-07 (×2): 12 [IU] via SUBCUTANEOUS
  Filled 2021-06-06 (×3): qty 0.12

## 2021-06-06 MED ORDER — PROPOFOL 10 MG/ML IV BOLUS
INTRAVENOUS | Status: DC | PRN
  Administered 2021-06-06: 150 mg via INTRAVENOUS

## 2021-06-06 MED ORDER — CEFAZOLIN SODIUM-DEXTROSE 2-4 GM/100ML-% IV SOLN
2.0000 g | INTRAVENOUS | Status: AC
  Administered 2021-06-06: 2 g via INTRAVENOUS
  Filled 2021-06-06: qty 100

## 2021-06-06 MED ORDER — METOPROLOL TARTRATE 5 MG/5ML IV SOLN
2.0000 mg | INTRAVENOUS | Status: DC | PRN
  Filled 2021-06-06: qty 5

## 2021-06-06 MED ORDER — FENTANYL CITRATE (PF) 100 MCG/2ML IJ SOLN
25.0000 ug | INTRAMUSCULAR | Status: DC | PRN
  Administered 2021-06-06 (×3): 50 ug via INTRAVENOUS

## 2021-06-06 MED ORDER — ONDANSETRON HCL 4 MG/2ML IJ SOLN
INTRAMUSCULAR | Status: DC | PRN
  Administered 2021-06-06: 4 mg via INTRAVENOUS

## 2021-06-06 MED ORDER — PIPERACILLIN-TAZOBACTAM 3.375 G IVPB
3.3750 g | Freq: Three times a day (TID) | INTRAVENOUS | Status: DC
  Administered 2021-06-06 – 2021-06-09 (×9): 3.375 g via INTRAVENOUS
  Filled 2021-06-06 (×9): qty 50

## 2021-06-06 MED ORDER — CHLORHEXIDINE GLUCONATE 0.12 % MT SOLN
15.0000 mL | Freq: Once | OROMUCOSAL | Status: AC
  Administered 2021-06-06: 15 mL via OROMUCOSAL
  Filled 2021-06-06: qty 15

## 2021-06-06 MED ORDER — ACETAMINOPHEN 10 MG/ML IV SOLN
1000.0000 mg | Freq: Once | INTRAVENOUS | Status: DC | PRN
  Administered 2021-06-06: 1000 mg via INTRAVENOUS

## 2021-06-06 MED ORDER — PREGABALIN 25 MG PO CAPS
50.0000 mg | ORAL_CAPSULE | Freq: Every day | ORAL | Status: DC
  Administered 2021-06-06 – 2021-06-11 (×6): 50 mg via ORAL
  Filled 2021-06-06 (×6): qty 2

## 2021-06-06 MED ORDER — VANCOMYCIN HCL 1250 MG/250ML IV SOLN
1250.0000 mg | INTRAVENOUS | Status: DC
  Administered 2021-06-06 – 2021-06-12 (×7): 1250 mg via INTRAVENOUS
  Filled 2021-06-06 (×7): qty 250

## 2021-06-06 MED ORDER — ALUM & MAG HYDROXIDE-SIMETH 200-200-20 MG/5ML PO SUSP: 15.0000 mL | ORAL | Status: DC | PRN

## 2021-06-06 MED ORDER — HYDRALAZINE HCL 20 MG/ML IJ SOLN
5.0000 mg | INTRAMUSCULAR | Status: DC | PRN
  Filled 2021-06-06: qty 0.25

## 2021-06-06 MED ORDER — PRAVASTATIN SODIUM 40 MG PO TABS
40.0000 mg | ORAL_TABLET | Freq: Every day | ORAL | Status: DC
  Administered 2021-06-06 – 2021-06-12 (×7): 40 mg via ORAL
  Filled 2021-06-06 (×7): qty 1

## 2021-06-06 MED ORDER — FERROUS SULFATE 325 (65 FE) MG PO TABS
325.0000 mg | ORAL_TABLET | Freq: Every day | ORAL | Status: DC
  Administered 2021-06-07 – 2021-06-12 (×6): 325 mg via ORAL
  Filled 2021-06-06 (×6): qty 1

## 2021-06-06 MED ORDER — GUAIFENESIN-DM 100-10 MG/5ML PO SYRP
15.0000 mL | ORAL_SOLUTION | ORAL | Status: DC | PRN
  Administered 2021-06-12: 09:00:00 15 mL via ORAL
  Filled 2021-06-06: qty 15

## 2021-06-06 MED ORDER — MIDAZOLAM HCL 2 MG/2ML IJ SOLN
INTRAMUSCULAR | Status: AC
  Filled 2021-06-06: qty 2

## 2021-06-06 MED ORDER — SODIUM CHLORIDE 0.9 % IR SOLN
Status: DC | PRN
  Administered 2021-06-06: 3000 mL

## 2021-06-06 MED ORDER — TRIAMTERENE-HCTZ 37.5-25 MG PO TABS
1.0000 | ORAL_TABLET | Freq: Every day | ORAL | Status: DC
  Administered 2021-06-06 – 2021-06-12 (×7): 1 via ORAL
  Filled 2021-06-06 (×7): qty 1

## 2021-06-06 MED ORDER — LOSARTAN POTASSIUM 50 MG PO TABS
100.0000 mg | ORAL_TABLET | Freq: Every day | ORAL | Status: DC
  Administered 2021-06-06 – 2021-06-12 (×7): 100 mg via ORAL
  Filled 2021-06-06 (×7): qty 2

## 2021-06-06 MED ORDER — ONDANSETRON HCL 4 MG/2ML IJ SOLN: 4.0000 mg | Freq: Once | INTRAMUSCULAR | Status: DC | PRN

## 2021-06-06 MED ORDER — ACETAMINOPHEN 10 MG/ML IV SOLN
INTRAVENOUS | Status: AC
  Filled 2021-06-06: qty 100

## 2021-06-06 MED ORDER — FENTANYL CITRATE (PF) 250 MCG/5ML IJ SOLN
INTRAMUSCULAR | Status: AC
  Filled 2021-06-06: qty 5

## 2021-06-06 MED ORDER — ACETAMINOPHEN 325 MG PO TABS: 650.0000 mg | ORAL_TABLET | Freq: Two times a day (BID) | ORAL | Status: DC | PRN

## 2021-06-06 MED ORDER — LACTATED RINGERS IV SOLN: INTRAVENOUS | Status: DC | PRN

## 2021-06-06 MED ORDER — V-GO 30 KIT: 1.2500 [IU] | PACK | Status: DC

## 2021-06-06 MED ORDER — EPHEDRINE SULFATE 50 MG/ML IJ SOLN
INTRAMUSCULAR | Status: DC | PRN
  Administered 2021-06-06: 5 mg via INTRAVENOUS

## 2021-06-06 MED ORDER — FENTANYL CITRATE (PF) 100 MCG/2ML IJ SOLN
INTRAMUSCULAR | Status: AC
  Filled 2021-06-06: qty 2

## 2021-06-06 MED ORDER — TRAMADOL HCL 50 MG PO TABS
ORAL_TABLET | ORAL | Status: AC
  Filled 2021-06-06: qty 1

## 2021-06-06 MED ORDER — PHENOL 1.4 % MT LIQD: 1.0000 | OROMUCOSAL | Status: DC | PRN

## 2021-06-06 MED ORDER — FENTANYL CITRATE (PF) 250 MCG/5ML IJ SOLN
INTRAMUSCULAR | Status: DC | PRN
  Administered 2021-06-06 (×3): 25 ug via INTRAVENOUS

## 2021-06-06 MED ORDER — LIDOCAINE 2% (20 MG/ML) 5 ML SYRINGE
INTRAMUSCULAR | Status: DC | PRN
  Administered 2021-06-06: 100 mg via INTRAVENOUS

## 2021-06-06 MED ORDER — PHENYLEPHRINE HCL-NACL 20-0.9 MG/250ML-% IV SOLN
INTRAVENOUS | Status: DC | PRN
  Administered 2021-06-06: 35 ug/min via INTRAVENOUS

## 2021-06-06 MED ORDER — LABETALOL HCL 5 MG/ML IV SOLN
10.0000 mg | INTRAVENOUS | Status: DC | PRN
  Filled 2021-06-06: qty 4

## 2021-06-06 MED ORDER — 0.9 % SODIUM CHLORIDE (POUR BTL) OPTIME
TOPICAL | Status: DC | PRN
  Administered 2021-06-06: 1000 mL

## 2021-06-06 MED ORDER — OXYCODONE HCL 5 MG PO TABS
5.0000 mg | ORAL_TABLET | ORAL | Status: DC | PRN
Start: 2021-06-06 — End: 2021-06-12
  Administered 2021-06-06 – 2021-06-08 (×6): 10 mg via ORAL
  Administered 2021-06-09 – 2021-06-11 (×4): 5 mg via ORAL
  Filled 2021-06-06 (×8): qty 2
  Filled 2021-06-06: qty 1
  Filled 2021-06-06 (×2): qty 2

## 2021-06-06 MED ORDER — HYDROMORPHONE HCL 1 MG/ML IJ SOLN
0.5000 mg | INTRAMUSCULAR | Status: DC | PRN
  Filled 2021-06-06: qty 1

## 2021-06-06 MED ORDER — ONDANSETRON HCL 4 MG/2ML IJ SOLN
4.0000 mg | Freq: Four times a day (QID) | INTRAMUSCULAR | Status: DC | PRN
  Filled 2021-06-06: qty 2

## 2021-06-06 MED ORDER — PANTOPRAZOLE SODIUM 40 MG PO TBEC
40.0000 mg | DELAYED_RELEASE_TABLET | Freq: Every day | ORAL | Status: DC
  Administered 2021-06-06 – 2021-06-12 (×7): 40 mg via ORAL
  Filled 2021-06-06 (×7): qty 1

## 2021-06-06 SURGICAL SUPPLY — 40 items
BAG COUNTER SPONGE SURGICOUNT (BAG) ×2 IMPLANT
BAG ISOLATION DRAPE 18X18 (DRAPES) IMPLANT
BAG SURGICOUNT SPONGE COUNTING (BAG) ×1
BNDG ELASTIC 4X5.8 VLCR STR LF (GAUZE/BANDAGES/DRESSINGS) IMPLANT
BNDG ELASTIC 6X5.8 VLCR STR LF (GAUZE/BANDAGES/DRESSINGS) ×4 IMPLANT
BNDG GAUZE ELAST 4 BULKY (GAUZE/BANDAGES/DRESSINGS) ×4 IMPLANT
CANISTER SUCT 3000ML PPV (MISCELLANEOUS) ×3 IMPLANT
CLIP LIGATING EXTRA MED SLVR (CLIP) ×3 IMPLANT
CLIP LIGATING EXTRA SM BLUE (MISCELLANEOUS) ×3 IMPLANT
DRAPE EXTREMITY BILATERAL (DRAPES) IMPLANT
DRAPE HALF SHEET 40X57 (DRAPES) ×4 IMPLANT
DRAPE ISOLATION BAG 18X18 (DRAPES)
DRAPE U-SHAPE 76X120 STRL (DRAPES) IMPLANT
ELECT REM PT RETURN 9FT ADLT (ELECTROSURGICAL) ×3
ELECTRODE REM PT RTRN 9FT ADLT (ELECTROSURGICAL) ×1 IMPLANT
GAUZE SPONGE 4X4 12PLY STRL (GAUZE/BANDAGES/DRESSINGS) ×3 IMPLANT
GAUZE XEROFORM 5X9 LF (GAUZE/BANDAGES/DRESSINGS) ×2 IMPLANT
GLOVE SURG ENC MOIS LTX SZ7.5 (GLOVE) ×3 IMPLANT
GOWN STRL REUS W/ TWL LRG LVL3 (GOWN DISPOSABLE) ×2 IMPLANT
GOWN STRL REUS W/ TWL XL LVL3 (GOWN DISPOSABLE) ×1 IMPLANT
GOWN STRL REUS W/TWL LRG LVL3 (GOWN DISPOSABLE) ×6
GOWN STRL REUS W/TWL XL LVL3 (GOWN DISPOSABLE) ×3
HANDPIECE INTERPULSE COAX TIP (DISPOSABLE) ×3
IV NS IRRIG 3000ML ARTHROMATIC (IV SOLUTION) ×2 IMPLANT
KIT BASIN OR (CUSTOM PROCEDURE TRAY) ×3 IMPLANT
KIT TURNOVER KIT B (KITS) ×3 IMPLANT
MANIFOLD NEPTUNE II (INSTRUMENTS) ×2 IMPLANT
NS IRRIG 1000ML POUR BTL (IV SOLUTION) ×3 IMPLANT
PACK CV ACCESS (CUSTOM PROCEDURE TRAY) IMPLANT
PACK GENERAL/GYN (CUSTOM PROCEDURE TRAY) ×3 IMPLANT
PACK UNIVERSAL I (CUSTOM PROCEDURE TRAY) ×3 IMPLANT
PAD ARMBOARD 7.5X6 YLW CONV (MISCELLANEOUS) ×6 IMPLANT
SET HNDPC FAN SPRY TIP SCT (DISPOSABLE) IMPLANT
SUT ETHILON 3 0 PS 1 (SUTURE) ×2 IMPLANT
SUT VIC AB 2-0 CTX 36 (SUTURE) IMPLANT
SUT VIC AB 3-0 SH 27 (SUTURE)
SUT VIC AB 3-0 SH 27X BRD (SUTURE) IMPLANT
SUT VICRYL 4-0 PS2 18IN ABS (SUTURE) IMPLANT
TOWEL GREEN STERILE (TOWEL DISPOSABLE) ×3 IMPLANT
WATER STERILE IRR 1000ML POUR (IV SOLUTION) ×3 IMPLANT

## 2021-06-06 NOTE — Progress Notes (Signed)
Patient to 4E04 from PACU. Vital sings obtained. On monitor CCMD notified. CHG bath completed. Alert to room and call light. Call bell within reach.  Era Bumpers, RN

## 2021-06-06 NOTE — Progress Notes (Signed)
Inpatient Diabetes Program Recommendations  AACE/ADA: New Consensus Statement on Inpatient Glycemic Control (2015)  Target Ranges:  Prepandial:   less than 140 mg/dL      Peak postprandial:   less than 180 mg/dL (1-2 hours)      Critically ill patients:  140 - 180 mg/dL   Lab Results  Component Value Date   GLUCAP 111 (H) 06/06/2021   HGBA1C 11.3 (H) 10/22/2019    Review of Glycemic Control  Latest Reference Range & Units 06/06/21 06:07 06/06/21 07:01 06/06/21 08:44  Glucose-Capillary 70 - 99 mg/dL 258 (H) 187 (H) 111 (H)  (H): Data is abnormally high Diabetes history: DM 2 Outpatient Diabetes medications:  V-Go 30- patient takes 5 clicks with each meal Current orders for Inpatient glycemic control:  None  Spoke with PACU to verify patient was no longer wearing V-GO. Patient reports, "I took it off a long time ago." Per RN note from admission patient's CBG was 409 mg/dL and subsequent took Novolog 12 units x 1. Encouraged RN to recheck CBG within the hour to assess for hypoglycemia.   Recommend: -Adding Novolog 0-9 units Tid & HS -A1C -Levemir 12 units QHS ( to start tonight).  Secure chat sent to PA  Thanks, Bronson Curb, MSN, RNC-OB Diabetes Coordinator [phone] (8a-5p)

## 2021-06-06 NOTE — Op Note (Signed)
° ° °  Patient name: [name] MRN: [id number] DOB: 06/04/1951 Sex: male  06/06/2021 Pre-operative Diagnosis: Left lower extremity fluid collection Post-operative diagnosis:  Same Surgeon:  Erlene Quan C. 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  This has been complicated by infection.  He now has a large fluid collection in the left leg confirmed with CT scan does not appear to involve the bypass.  He is indicated for drainage.  Findings: There was significant amounts of turbid fluid removed through an area on the lateral left leg that was already draining and a counterincision was made in the thigh.  A Penrose drain was placed at completion.   Procedure:  The patient was identified in the holding area and taken to the operating room where is placed supine on upper table and general anesthesia was induced.  He was sterilely prepped over the left lower extremity usual fashion, antibiotics were administered and a timeout was called.  There was an area that was draining in the lateral left leg this was opened further.  There was significant turbid fluid this was cultured with a aerobic anaerobic cultures.  I made a counterincision in the thigh after identifying the fluid with ultrasound.  I was able to reach my fingers through from both sides.  I performed pulse evacuation lavage with a total of 3 L of saline.  I then placed 1/2 inch Penrose drain through both incision sites and sutured this together at the opening.  I did place staples in the thigh incision and a few in the leg incision that had been extended.  The leg was cleaned up and wrapped with Kerlix and Ace wrap and a dressing was placed in the thigh incision.  He was awakened from anesthesia having tolerated procedure well any complication.  All counts were correct to completion.  EBL: 100 cc  Specimen: Aerobic anaerobic  cultures    Cliff Damiani C. Donzetta Matters, MD [facility] Office: [phone] Pager: [phone]

## 2021-06-06 NOTE — Progress Notes (Signed)
Patient's CBG at 0700= 187. Dr. Gifford Shave made aware. No further orders received.

## 2021-06-06 NOTE — Progress Notes (Signed)
Orthopedic Tech Progress Note Patient Details:  [name] 1950/12/04 [id number]  OR RN called requesting an Louretta Parma BOOT. Dropped box off to OR DESK  Ortho Devices Type of Ortho Device: Unna boot Ortho Device/Splint Interventions: Other (comment)   Post Interventions Patient Tolerated: Other (comment) Instructions Provided: Other (comment)  Janit Pagan 06/06/2021, 7:58 AM

## 2021-06-06 NOTE — Progress Notes (Signed)
Pharmacy Antibiotic Note  [name] is a 70 y.o. male admitted on 06/06/2021  for surgery fort he diagnosis of fluid collection left lower extremity.. Now S/p  I& D LLE fluid collection, drain placed 06/06/21.  Pharmacy has been consulted 12/13 for Vancomycin dosing for wound infection .  Plan: Vancomycin 1250 mg IV q24hr  Goal AUC 400-550. Expected AUC: 473.2, SCr used 1.60, Vd used  0.5 L /kg Also receiving Zosyn 3.375 g IV q8h extended 4hr infusion (MD dosing) Monitor clinical progress, renal function. Check steady state vancomycin levels per protocol if needed.    Height: 6\' 2"  (188 cm) Weight: 115.2 kg (254 lb) IBW/kg (Calculated) : 82.2  Temp (24hrs), Avg:98 F (36.7 C), Min:97.8 F (36.6 C), Max:98.6 F (37 C)  Recent Labs  Lab 05/31/21 1222 06/06/21 0620 06/06/21 1342  WBC  --   --  9.5  CREATININE 1.58* 1.60* 1.64*    Estimated Creatinine Clearance: 56.6 mL/min (A) (by C-G formula based on SCr of 1.64 mg/dL (H)).    No Known Allergies  Antimicrobials this admission: Cefazolin 12/13 x1 Zosyn 12/13>>(12/20 AM) Vanc 12/13>>(12/19)  Dose adjustments this admission:   Microbiology results: 12/13 wound left leg gram stain: pending 12/13 surgical pcr: MRSA Positive   Thank you for allowing pharmacy to be a part of this patients care.  Nicole Cella, RPh Clinical Pharmacist [phone] 06/06/2021 2:58 PM  Please check AMION for all [place] phone numbers After 10:00 PM, call [place] [phone]

## 2021-06-06 NOTE — Interval H&P Note (Signed)
History and Physical Interval Note:  06/06/2021 7:18 AM  [name]  has presented today for surgery, with the diagnosis of fluid collection left lower extremity.  The various methods of treatment have been discussed with the patient and family. After consideration of risks, benefits and other options for treatment, the patient has consented to  Procedure(s): INCISION AND DRAINAGE OF LEFT LOWER EXTREMITY FLUID COLLECTION (Left) as a surgical intervention.  The patient's history has been reviewed, patient examined, no change in status, stable for surgery.  I have reviewed the patient's chart and labs.  Questions were answered to the patient's satisfaction.     [name]

## 2021-06-06 NOTE — Anesthesia Procedure Notes (Signed)
Procedure Name: LMA Insertion Date/Time: 06/06/2021 7:32 AM Performed by: Glynda Jaeger, CRNA Pre-anesthesia Checklist: Patient identified, Emergency Drugs available, Suction available and Patient being monitored Patient Re-evaluated:Patient Re-evaluated prior to induction Oxygen Delivery Method: Circle System Utilized Preoxygenation: Pre-oxygenation with 100% oxygen Induction Type: IV induction Ventilation: Mask ventilation without difficulty LMA: LMA inserted LMA Size: 4.0 Number of attempts: 1 Airway Equipment and Method: Bite block Placement Confirmation: positive ETCO2 Tube secured with: Tape Dental Injury: Teeth and Oropharynx as per pre-operative assessment

## 2021-06-06 NOTE — Transfer of Care (Signed)
Immediate Anesthesia Transfer of Care Note  Patient: [name]  Procedure(s) Performed: INCISION AND DRAINAGE OF LEFT LOWER EXTREMITY (Left: Leg Lower)  Patient Location: PACU  Anesthesia Type:General  Level of Consciousness: awake, alert , patient cooperative and responds to stimulation  Airway & Oxygen Therapy: Patient Spontanous Breathing and Patient connected to face mask oxygen  Post-op Assessment: Report given to RN and Post -op Vital signs reviewed and stable  Post vital signs: Reviewed and stable  Last Vitals:  Vitals Value Taken Time  BP 118/69 06/06/21 0844  Temp    Pulse 77 06/06/21 0846  Resp 23 06/06/21 0846  SpO2 99 % 06/06/21 0846  Vitals shown include unvalidated device data.  Last Pain:  Vitals:   06/06/21 0615  TempSrc:   PainSc: 4       Patients Stated Pain Goal: 1 (90/30/09 2330)  Complications: No notable events documented.

## 2021-06-06 NOTE — Progress Notes (Signed)
Patient states he checked his blood sugar before leaving home this morning and his blood sugar was 409. Patient states he took 12 units of Novolog. Patient's CBG at 0610= 258. Dr. Gifford Shave with anesthesia made aware and verbal order received to recheck patient's blood sugar at 7 am.

## 2021-06-06 NOTE — Anesthesia Postprocedure Evaluation (Signed)
Anesthesia Post Note  Patient: [name]  Procedure(s) Performed: INCISION AND DRAINAGE OF LEFT LOWER EXTREMITY (Left: Leg Lower)     Patient location during evaluation: PACU Anesthesia Type: General Level of consciousness: awake and alert Pain management: pain level controlled Vital Signs Assessment: post-procedure vital signs reviewed and stable Respiratory status: spontaneous breathing, nonlabored ventilation and respiratory function stable Cardiovascular status: blood pressure returned to baseline and stable Postop Assessment: no apparent nausea or vomiting Anesthetic complications: no   No notable events documented.  Last Vitals:  Vitals:   06/06/21 1300 06/06/21 1413  BP:  112/67  Pulse: 78 76  Resp: 19 15  Temp:  36.6 C  SpO2: 100% 96%    Last Pain:  Vitals:   06/06/21 1413  TempSrc: Oral  PainSc: 5                  Catalina Gravel

## 2021-06-07 ENCOUNTER — Encounter (HOSPITAL_COMMUNITY): Payer: Self-pay | Admitting: Vascular Surgery

## 2021-06-07 DIAGNOSIS — Z9889 Other specified postprocedural states: Secondary | ICD-10-CM

## 2021-06-07 LAB — GLUCOSE, CAPILLARY
Glucose-Capillary: 478 mg/dL — ABNORMAL HIGH (ref 70–99)
Glucose-Capillary: 423 mg/dL — ABNORMAL HIGH (ref 70–99)
Glucose-Capillary: 419 mg/dL — ABNORMAL HIGH (ref 70–99)
Glucose-Capillary: 367 mg/dL — ABNORMAL HIGH (ref 70–99)
Glucose-Capillary: 254 mg/dL — ABNORMAL HIGH (ref 70–99)

## 2021-06-07 LAB — RESP PANEL BY RT-PCR (FLU A&B, COVID) ARPGX2
Influenza A by PCR: NEGATIVE
Influenza B by PCR: NEGATIVE
SARS Coronavirus 2 by RT PCR: NEGATIVE

## 2021-06-07 MED ORDER — MUPIROCIN 2 % EX OINT
1.0000 "application " | TOPICAL_OINTMENT | Freq: Two times a day (BID) | CUTANEOUS | Status: AC
  Administered 2021-06-07 – 2021-06-11 (×9): 1 via NASAL
  Filled 2021-06-07: qty 22

## 2021-06-07 MED ORDER — INFLUENZA VAC A&B SA ADJ QUAD 0.5 ML IM PRSY
0.5000 mL | PREFILLED_SYRINGE | INTRAMUSCULAR | Status: AC
  Administered 2021-06-08: 0.5 mL via INTRAMUSCULAR
  Filled 2021-06-07: qty 0.5

## 2021-06-07 MED ORDER — CHLORHEXIDINE GLUCONATE CLOTH 2 % EX PADS
6.0000 | MEDICATED_PAD | Freq: Every day | CUTANEOUS | Status: AC
  Administered 2021-06-07 – 2021-06-11 (×5): 6 via TOPICAL

## 2021-06-07 MED ORDER — INSULIN PUMP
Freq: Three times a day (TID) | SUBCUTANEOUS | Status: DC
  Administered 2021-06-12: 12:00:00 3 via SUBCUTANEOUS
  Filled 2021-06-07: qty 1

## 2021-06-07 NOTE — Progress Notes (Signed)
Inpatient Diabetes Program Recommendations  AACE/ADA: New Consensus Statement on Inpatient Glycemic Control (2015)  Target Ranges:  Prepandial:   less than 140 mg/dL      Peak postprandial:   less than 180 mg/dL (1-2 hours)      Critically ill patients:  140 - 180 mg/dL   Lab Results  Component Value Date   GLUCAP 367 (H) 06/07/2021   HGBA1C 11.0 (H) 06/06/2021    Review of Glycemic Control  Diabetes history: DM2 Outpatient Diabetes medications: V-Go 30 and takes 3-5 clicks (2 units per click) with meals Current orders for Inpatient glycemic control: Insulin pump per home settings Levemir 12 units QHS  HgbA1C - 11.0%  Inpatient Diabetes Program Recommendations:    Discontinue Levemir 12 units QHS Continue with insulin pump order set which includes d/cing SQ insulin when pump begins.  If blood sugars > 250 mg/dL, pump should be removed and SQ insulin (including basal and bolus) will be needed.  Lantus 25 units QD Novolog 0-15 TID with meals and 0-5 HS Novolog 10 units TID with meals  Spoke with pt on 2 occasions today regarding his HgbA1C and use of V-Go insulin pump at home. Pt states he put pump back on at lunch today because he didn't think his blood sugars should be in the 400s. RN put in insulin pump order set and this diabetes coordinator spoke with her about discontinuing all SQ insulin per Insulin pump order set. Also instructed pt that if he wanted blood sugar checked before it was time, he would need to notify his nurse of his request. Spoke again with pt prior to dinner meal and pt bolused himself 10 units. RN stated she would recheck blood sugar in 1 hour to make certain it was coming down. Reminded to d/c all SQ insulin. Levemir 12 units QHS has been given at 2121.  If blood sugars > 250 mg/dL, pump should be removed and SQ insulin orders will need to include both basal and bolus insulin.  Will f/u in am.  Thank you. Lorenda Peck, RD, LDN, CDE Inpatient  Diabetes Coordinator [phone]

## 2021-06-07 NOTE — Progress Notes (Signed)
°  Progress Note    06/07/2021 8:50 AM 1 Day Post-Op  Subjective: No real complaints this morning  Vitals:   06/07/21 0410 06/07/21 0722  BP: (!) 115/58 104/75  Pulse: 76 78  Resp: 17 15  Temp: 98.7 F (37.1 C) 99.3 F (37.4 C)  SpO2: 94% 100%    Physical Exam: Awake alert oriented None the respirations Left lower extremity dressing below the knee is intact with minimal spotting on the dressing Above the knee the Penrose is in place there is turbid fluid drainage from this  CBC    Component Value Date/Time   WBC 9.5 06/06/2021 1342   RBC 4.09 (L) 06/06/2021 1342   HGB 9.8 (L) 06/06/2021 1342   HCT 31.2 (L) 06/06/2021 1342   PLT 291 06/06/2021 1342   MCV 76.3 (L) 06/06/2021 1342   MCH 24.0 (L) 06/06/2021 1342   MCHC 31.4 06/06/2021 1342   RDW 19.1 (H) 06/06/2021 1342   LYMPHSABS 1.6 05/30/2021 1223   MONOABS 0.9 05/30/2021 1223   EOSABS 0.5 05/30/2021 1223   BASOSABS 0.0 05/30/2021 1223    BMET    Component Value Date/Time   NA 133 (L) 06/06/2021 1342   K 4.9 06/06/2021 1342   CL 100 06/06/2021 1342   CO2 25 06/06/2021 1342   GLUCOSE 245 (H) 06/06/2021 1342   BUN 21 06/06/2021 1342   BUN 24 05/31/2021 1222   CREATININE 1.64 (H) 06/06/2021 1342   CALCIUM 8.8 (L) 06/06/2021 1342   GFRNONAA 45 (L) 06/06/2021 1342   GFRAA >60 10/29/2019 0226    INR    Component Value Date/Time   INR 1.9 (H) 10/23/2019 0618     Intake/Output Summary (Last 24 hours) at 06/07/2021 0850 Last data filed at 06/07/2021 0723 Gross per 24 hour  Intake 1296.49 ml  Output 2445 ml  Net -1148.51 ml     Assessment/plan:  70 y.o. male is s/p I&D of left lower extremity fluid collection.  There is still turbid fluid draining from the above the knee incision site.  Dressing can all come off today plan to shower.  We will follow-up cultures.  He will need infectious disease involvement to tailor antibiotic treatment.  Anaijah Augsburger C. Donzetta Matters, MD [facility]  [place] Office: [phone] Pager: [phone]  06/07/2021 8:50 AM

## 2021-06-07 NOTE — Progress Notes (Signed)
°  Transition of Care [facility] Screening Note   Patient Details  Name: [name] Date of Birth: [birth year]   Transition of Care [facility]) CM/SW Contact:    Dawayne Patricia, RN Phone Number: 06/07/2021, 2:38 PM    Transition of Care Department [facility]) has reviewed patient and no TOC needs have been identified at this time s/p I&D. We will continue to monitor patient advancement through interdisciplinary progression rounds. If new patient transition needs arise, please place a TOC consult.

## 2021-06-07 NOTE — Progress Notes (Signed)
Pt informed staff of placing his insulin pump on and dosing himself with insulin after glucose checked. Diabetes coordinator notified. Pt signed insulin pump contract and informed of policy on insulin pump management.

## 2021-06-08 ENCOUNTER — Encounter (HOSPITAL_BASED_OUTPATIENT_CLINIC_OR_DEPARTMENT_OTHER): Payer: BC Managed Care – PPO | Admitting: Internal Medicine

## 2021-06-08 ENCOUNTER — Encounter (HOSPITAL_COMMUNITY): Payer: Self-pay

## 2021-06-08 ENCOUNTER — Ambulatory Visit (HOSPITAL_COMMUNITY): Payer: BC Managed Care – PPO

## 2021-06-08 DIAGNOSIS — I739 Peripheral vascular disease, unspecified: Secondary | ICD-10-CM

## 2021-06-08 DIAGNOSIS — Z794 Long term (current) use of insulin: Secondary | ICD-10-CM

## 2021-06-08 DIAGNOSIS — E119 Type 2 diabetes mellitus without complications: Secondary | ICD-10-CM

## 2021-06-08 LAB — GLUCOSE, CAPILLARY
Glucose-Capillary: 131 mg/dL — ABNORMAL HIGH (ref 70–99)
Glucose-Capillary: 233 mg/dL — ABNORMAL HIGH (ref 70–99)
Glucose-Capillary: 319 mg/dL — ABNORMAL HIGH (ref 70–99)
Glucose-Capillary: 437 mg/dL — ABNORMAL HIGH (ref 70–99)

## 2021-06-08 NOTE — Progress Notes (Signed)
Inpatient Diabetes Program Recommendations  AACE/ADA: New Consensus Statement on Inpatient Glycemic Control (2015)  Target Ranges:  Prepandial:   less than 140 mg/dL      Peak postprandial:   less than 180 mg/dL (1-2 hours)      Critically ill patients:  140 - 180 mg/dL   Lab Results  Component Value Date   GLUCAP 131 (H) 06/08/2021   HGBA1C 11.0 (H) 06/06/2021    Review of Glycemic Control  Latest Reference Range & Units 06/07/21 17:07 06/07/21 21:21 06/08/21 06:15  Glucose-Capillary 70 - 99 mg/dL 367 (H) 254 (H) 131 (H)  (H): Data is abnormally high Diabetes history: DM2 Outpatient Diabetes medications: V-Go 30 and takes 3-5 clicks (2 units per click) with meals Current orders for Inpatient glycemic control: Insulin pump per home settings Levemir 12 units QHS   HgbA1C - 11.0%  Inpatient Diabetes Program Recommendations:    V-Go applied based on documentation. FBG 131 mg/dL. Consider discontinuing Levemir. Discussed with PA, Orders received.   Thanks, Bronson Curb, MSN, RNC-OB Diabetes Coordinator [phone] (8a-5p)

## 2021-06-08 NOTE — Progress Notes (Addendum)
°  Progress Note    06/08/2021 8:03 AM 2 Days Post-Op  Subjective:  left leg sore but otherwise no complaints   Vitals:   06/07/21 2309 06/08/21 0326  BP: 103/65 (!) 99/58  Pulse: 82 72  Resp: 20 20  Temp: 99.7 F (37.6 C) 99.5 F (37.5 C)  SpO2: 94% 95%   Physical Exam: Cardiac:  regular Lungs:  non labored Incisions: left leg dressings saturated. Removed dressings. Penrose drain in place. Continues to have turbid serosanguinous drainage from both openings. Ulcer on left foot and anterior leg stable. Xeroform, Kerlix, ABDs reapplied followed by ACE Neurologic: alert and oriented  CBC    Component Value Date/Time   WBC 9.5 06/06/2021 1342   RBC 4.09 (L) 06/06/2021 1342   HGB 9.8 (L) 06/06/2021 1342   HCT 31.2 (L) 06/06/2021 1342   PLT 291 06/06/2021 1342   MCV 76.3 (L) 06/06/2021 1342   MCH 24.0 (L) 06/06/2021 1342   MCHC 31.4 06/06/2021 1342   RDW 19.1 (H) 06/06/2021 1342   LYMPHSABS 1.6 05/30/2021 1223   MONOABS 0.9 05/30/2021 1223   EOSABS 0.5 05/30/2021 1223   BASOSABS 0.0 05/30/2021 1223    BMET    Component Value Date/Time   NA 133 (L) 06/06/2021 1342   K 4.9 06/06/2021 1342   CL 100 06/06/2021 1342   CO2 25 06/06/2021 1342   GLUCOSE 245 (H) 06/06/2021 1342   BUN 21 06/06/2021 1342   BUN 24 05/31/2021 1222   CREATININE 1.64 (H) 06/06/2021 1342   CALCIUM 8.8 (L) 06/06/2021 1342   GFRNONAA 45 (L) 06/06/2021 1342   GFRAA >60 10/29/2019 0226    INR    Component Value Date/Time   INR 1.9 (H) 10/23/2019 0618     Intake/Output Summary (Last 24 hours) at 06/08/2021 0803 Last data filed at 06/08/2021 0749 Gross per 24 hour  Intake 833.38 ml  Output 1745 ml  Net -911.62 ml     Assessment/Plan:  70 y.o. male is s/p I&D of left lower extremity 2 Days Post-Op   Continues to have a lot of turbid fluid drainage from left leg incision sites. Penrose drain remains in place. Dressings were changed both yesterday afternoon and this morning. Continue  daily dressings changes and reinforce as needed. Cultures re incubated. Waiting on final report. GPC so far. Remains on Vanc and Zosyn. Will ask ID to see patient to tailor Abx. Notified that patient is using his own insulin pump. Appreciate DM coordinator assistance   DVT prophylaxis:  sq heparin   Karoline Caldwell, PA-C [facility] [phone] 06/08/2021 8:03 AM  I have independently interviewed and examined patient and agree with PA assessment and plan above. Appreciate input from ID. Await final culture results and ideally until fluid clearing up prior to dc.    Carlea Badour C. Donzetta Matters, MD [facility] [place] Office: [phone] Pager: [phone]
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
## 2021-06-09 ENCOUNTER — Encounter (HOSPITAL_COMMUNITY): Payer: Self-pay | Admitting: Vascular Surgery

## 2021-06-09 DIAGNOSIS — E1165 Type 2 diabetes mellitus with hyperglycemia: Secondary | ICD-10-CM

## 2021-06-09 LAB — GLUCOSE, CAPILLARY
Glucose-Capillary: 305 mg/dL — ABNORMAL HIGH (ref 70–99)
Glucose-Capillary: 207 mg/dL — ABNORMAL HIGH (ref 70–99)
Glucose-Capillary: 147 mg/dL — ABNORMAL HIGH (ref 70–99)
Glucose-Capillary: 123 mg/dL — ABNORMAL HIGH (ref 70–99)

## 2021-06-09 MED ORDER — SODIUM CHLORIDE 0.9 % IV SOLN: 2.0000 g | Freq: Two times a day (BID) | INTRAVENOUS | Status: DC

## 2021-06-09 MED ORDER — INSULIN ASPART 100 UNIT/ML IJ SOLN
0.0000 [IU] | Freq: Every day | INTRAMUSCULAR | Status: DC
Start: 2021-06-09 — End: 2021-06-12

## 2021-06-09 MED ORDER — CIPROFLOXACIN HCL 500 MG PO TABS
500.0000 mg | ORAL_TABLET | Freq: Two times a day (BID) | ORAL | Status: DC
  Administered 2021-06-09 – 2021-06-12 (×7): 500 mg via ORAL
  Filled 2021-06-09 (×7): qty 1

## 2021-06-09 MED ORDER — LIVING WELL WITH DIABETES BOOK
Freq: Once | Status: AC
  Filled 2021-06-09: qty 1

## 2021-06-09 MED ORDER — INSULIN ASPART 100 UNIT/ML IJ SOLN
0.0000 [IU] | Freq: Three times a day (TID) | INTRAMUSCULAR | Status: DC
  Administered 2021-06-09: 2 [IU] via SUBCUTANEOUS
  Administered 2021-06-10 (×2): 3 [IU] via SUBCUTANEOUS
  Administered 2021-06-10: 5 [IU] via SUBCUTANEOUS
  Administered 2021-06-11: 06:00:00 11 [IU] via SUBCUTANEOUS
  Administered 2021-06-11: 13:00:00 8 [IU] via SUBCUTANEOUS
  Administered 2021-06-11: 17:00:00 5 [IU] via SUBCUTANEOUS
  Administered 2021-06-12: 07:00:00 2 [IU] via SUBCUTANEOUS
  Administered 2021-06-12: 12:00:00 8 [IU] via SUBCUTANEOUS

## 2021-06-09 NOTE — Consult Note (Signed)
[place] Nurse Consult Note: Patient receiving care in [place] Reason for Consult: Left leg wound Wound type: Chronic venous insufficiency, being followed by [facility]. Remi Haggard PA assessed patient while I was in the room. Two wounds on the LLE. Shin wound is measuring 2.5 cm x 2 cm and is pink and moist Ulcer on the left great toe measures 3 cm x 3 cm and is pink surrounded by callused tissue. Penrose drain in place in the left upper leg. Pressure Injury POA: NA Drainage (amount, consistency, odor) Minimal drainage from the shin wound and the great toe wound.  Large amount of pink/purulent/serous appearing drainage from the penrose drain site Dressing procedure/placement/frequency: Left shin wound and the left great toe wound: apply a cut to fit piece of Xeroform gauze, cover with 4 x 4 and wrap with Kerlix.  Penrose drain sites: cover with ADB pads and wrap with Kerlix. Wrap the entire leg from just below the toes to just over the knee with Ace Wrap. Change daily or PRN soiling.   Monitor the wound area(s) for worsening of condition such as: Signs/symptoms of infection, increase in size, development of or worsening of odor, development of pain, or increased pain at the affected locations.   Notify the medical team if any of these develop.  Thank you for the consult. [place] nurse will not follow at this time.   Please re-consult the [place] team if needed.  Cathlean Marseilles Tamala Julian, MSN, RN, [place], Lysle Pearl, [facility] Wound Treatment Associate Pager [phone]

## 2021-06-09 NOTE — Progress Notes (Addendum)
°  Progress Note    06/09/2021 9:24 AM 3 Days Post-Op  Subjective:  no complaints. Concerned about possibly going home with IV abx   Vitals:   06/09/21 0414 06/09/21 0756  BP: 122/73 129/67  Pulse: 65 66  Resp: 18 18  Temp: 99 F (37.2 C) 98.4 F (36.9 C)  SpO2: 93% 97%   Physical Exam: Cardiac:  regular Lungs:  non labored Incisions:  left leg dressings just changed by [place] nurse. Penrose drain in place. Continues to have turbid serosanguinous drainage from both wound openings. Ulcer on left foot and anterior leg stable. Xeroform, Kerlix, ABDs reapplied followed by ACE Extremities: BLE well perfused and warm Neurologic: alert and oriented  CBC    Component Value Date/Time   WBC 9.5 06/06/2021 1342   RBC 4.09 (L) 06/06/2021 1342   HGB 9.8 (L) 06/06/2021 1342   HCT 31.2 (L) 06/06/2021 1342   PLT 291 06/06/2021 1342   MCV 76.3 (L) 06/06/2021 1342   MCH 24.0 (L) 06/06/2021 1342   MCHC 31.4 06/06/2021 1342   RDW 19.1 (H) 06/06/2021 1342   LYMPHSABS 1.6 05/30/2021 1223   MONOABS 0.9 05/30/2021 1223   EOSABS 0.5 05/30/2021 1223   BASOSABS 0.0 05/30/2021 1223    BMET    Component Value Date/Time   NA 133 (L) 06/06/2021 1342   K 4.9 06/06/2021 1342   CL 100 06/06/2021 1342   CO2 25 06/06/2021 1342   GLUCOSE 245 (H) 06/06/2021 1342   BUN 21 06/06/2021 1342   BUN 24 05/31/2021 1222   CREATININE 1.64 (H) 06/06/2021 1342   CALCIUM 8.8 (L) 06/06/2021 1342   GFRNONAA 45 (L) 06/06/2021 1342   GFRAA >60 10/29/2019 0226    INR    Component Value Date/Time   INR 1.9 (H) 10/23/2019 0618     Intake/Output Summary (Last 24 hours) at 06/09/2021 0924 Last data filed at 06/09/2021 1173 Gross per 24 hour  Intake 2316.51 ml  Output 3645 ml  Net -1328.49 ml     Assessment/Plan:  70 y.o. male is s/p I&D left lower extremity 3 Days Post-Op   Lower extremities remain well perfused Continues to have a lot of turbid fluid drainage from left leg incision sites. Penrose  drain remains in place. Dressings were changed today by [facility] RN. Continue daily dressings changes and reinforce as needed.  Cultures re incubated. Waiting on final report. Appreciate ID assistance. Zosyn d/c now on Vanc and Cefepime. Possible d/c with IV Abx and PICC Hgb 9.8 down from 11.6. Will continue to monitor Uncontrolled DM- A1C 11. Has own insulin pump but may need to stop pump and go to SQ insulin as blood sugars are remaining >250   Karoline Caldwell, PA-C [facility] [phone] 06/09/2021 9:24 AM  I have independently interviewed and examined patient and agree with PA assessment and plan above. Appreciate ID and wound care assistance.   Ardit Danh C. Donzetta Matters, MD [facility] [place] Office: [phone] Pager: [phone]

## 2021-06-09 NOTE — Progress Notes (Addendum)
[facility] Infectious [facility]  Date of Admission:  06/06/2021   Total days of inpatient antibiotics 4  Principal Problem:   PAD (peripheral artery disease) [facility])          Assessment: 70 year old male with history of stent placement in distal left SFA in 2019 subsequently developed rapid stent thrombosis. femoral-peroneal bypass utilizing pt's saphenous vein in June 0272 complicated by multiple bouts of infection including MRSA.  Admitted for left calf wound along posterior calf surrounding the stent.    #LLE abscess surrounding occluded stent SP I&D on 12/13 with poly microbial cx(Staph aureus, GBS, morgenella morganii and klebsiella oxytoca) #Drain in place of post surgical leg wound #Chronically occluded stent #Uncontrolled DM (A1C 11 on 06/06/21) leading to poor wound healing -Previous abscess in 2022 SP I&D in April, 2021 was originating at stent as well. ID was consulted and did not feel stent was infected but rather thrombosed. As Cx+ MRSA pt treated with doxycyline for 2 weeks post op.  -In addition to chronically thrombosed stent,  vascular interventions have likely lead to  poor lymphatics which is contributing to increasing likelihood of abscess formation.  -As abscess around stent is recurrent would consider occluded stent removal. I spoke with Dr. Donzetta Matters and stent removal may not be feasible without causing further damage.   Recommendations:  -D/C cefepime -Start ciprofloxacin PO(cover klebsiella and morgenella), obtain EKG for qtc.  -Continue vancomycin  -Follow OR Cx to completion.  -Will treat with 4 weeks of antibiotics from OR (12/13- 07/03/21)as surgical intervention for stent is unlikely. I suspect that pt will be at risk for repeated infection. Since abscess is likely 2/2 mechanical obstruction(stent) the role of suppressive antibiotics is minimal and may  select for resistance.  -Follow  blood Cx from 12/15   Microbiology:    Antibiotics: Cefaxolin 12/13 Vancomycin 12/13-p Pip-tazo 12/13-12/15 Cultures: OR Cx: Moderate staph aureus, GBS, Morgenella morgenaii and Klebsiella oxytoca Morganella morganii Klebsiella oxytoca      MIC MIC    AMPICILLIN >=32 RESIST... Resistant >=32 RESIST... Resistant    AMPICILLIN/SULBACTAM >=32 RESIST... Resistant 8 SENSITIVE  Sensitive    CEFAZOLIN >=64 RESIST... Resistant >=64 RESIST... Resistant    CEFEPIME   <=0.12 SENS... Sensitive    CEFTAZIDIME <=1 SENSITIVE  Sensitive <=1 SENSITIVE  Sensitive    CEFTRIAXONE   <=0.25 SENS... Sensitive    CIPROFLOXACIN <=0.25 SENS... Sensitive <=0.25 SENS... Sensitive    GENTAMICIN <=1 SENSITIVE  Sensitive <=1 SENSITIVE  Sensitive    IMIPENEM 2 SENSITIVE  Sensitive <=0.25 SENS... Sensitive    PIP/TAZO <=4 SENSITIVE  Sensitive <=4 SENSITIVE  Sensitive    TRIMETH/SULFA <=20 SENSIT... Sensitive <=20 SENSIT... Sensitive     SUBJECTIVE: Pt is resting in bed. No new complaints.  Interval: Afebrile overnight.   Review of Systems: Review of Systems  All other systems reviewed and are negative.   Scheduled Meds:  Chlorhexidine Gluconate Cloth  6 each Topical Q0600   clopidogrel  75 mg Oral Daily   colchicine  0.6 mg Oral QHS   ferrous sulfate  325 mg Oral Q breakfast   heparin  5,000 Units Subcutaneous Q8H   insulin aspart  0-15 Units Subcutaneous TID WC   insulin aspart  0-5 Units Subcutaneous QHS   insulin pump   Subcutaneous TID WC, HS, 0200   latanoprost  1 drop Both Eyes QHS   living well with diabetes book   Does not apply Once   losartan  100 mg Oral Q lunch   mupirocin ointment  1 application Nasal BID   pantoprazole  40 mg Oral Daily   potassium chloride  20-40 mEq Oral Once   pravastatin  40 mg Oral Q lunch   pregabalin  50 mg Oral Q supper   triamterene-hydrochlorothiazide  1 tablet Oral Q lunch   Continuous Infusions:  sodium chloride 50 mL/hr at 06/09/21 0159   ceFEPime (MAXIPIME) IV     vancomycin 1,250 mg  (06/09/21 1014)   PRN Meds:.acetaminophen, alum & mag hydroxide-simeth, guaiFENesin-dextromethorphan, hydrALAZINE, HYDROmorphone (DILAUDID) injection, labetalol, metoprolol tartrate, ondansetron, oxyCODONE, phenol No Known Allergies  OBJECTIVE: Vitals:   06/08/21 1948 06/09/21 0414 06/09/21 0756 06/09/21 1115  BP: 114/64 122/73 129/67 131/86  Pulse: 65 65 66 69  Resp: 20 18 18 18   Temp: 98.6 F (37 C) 99 F (37.2 C) 98.4 F (36.9 C) 98.7 F (37.1 C)  TempSrc: Oral Oral Oral Oral  SpO2: 94% 93% 97% 100%  Weight:      Height:       Body mass index is 32.61 kg/m.  Physical Exam Constitutional:      General: He is not in acute distress.    Appearance: He is normal weight. He is not toxic-appearing.  HENT:     Head: Normocephalic and atraumatic.     Right Ear: External ear normal.     Left Ear: External ear normal.     Nose: No congestion or rhinorrhea.     Mouth/Throat:     Mouth: Mucous membranes are moist.     Pharynx: Oropharynx is clear.  Eyes:     Extraocular Movements: Extraocular movements intact.     Conjunctiva/sclera: Conjunctivae normal.     Pupils: Pupils are equal, round, and reactive to light.  Cardiovascular:     Rate and Rhythm: Normal rate and regular rhythm.     Heart sounds: No murmur heard.   No friction rub. No gallop.  Pulmonary:     Effort: Pulmonary effort is normal.     Breath sounds: Normal breath sounds.  Abdominal:     General: Abdomen is flat. Bowel sounds are normal.     Palpations: Abdomen is soft.  Musculoskeletal:        General: No swelling.     Cervical back: Normal range of motion and neck supple.     Comments: LLE bandaged  Skin:    General: Skin is warm and dry.  Neurological:     General: No focal deficit present.     Mental Status: He is oriented to person, place, and time.  Psychiatric:        Mood and Affect: Mood normal.      Lab Results Lab Results  Component Value Date   WBC 9.5 06/06/2021   HGB 9.8 (L)  06/06/2021   HCT 31.2 (L) 06/06/2021   MCV 76.3 (L) 06/06/2021   PLT 291 06/06/2021    Lab Results  Component Value Date   CREATININE 1.64 (H) 06/06/2021   BUN 21 06/06/2021   NA 133 (L) 06/06/2021   K 4.9 06/06/2021   CL 100 06/06/2021   CO2 25 06/06/2021    Lab Results  Component Value Date   ALT 13 06/06/2021   AST 17 06/06/2021   ALKPHOS 94 06/06/2021   BILITOT 0.4 06/06/2021        Laurice Record, [facility] 06/09/2021, 12:42 PM

## 2021-06-09 NOTE — Progress Notes (Signed)
Inpatient Diabetes Program Recommendations  AACE/ADA: New Consensus Statement on Inpatient Glycemic Control (2015)  Target Ranges:  Prepandial:   less than 140 mg/dL      Peak postprandial:   less than 180 mg/dL (1-2 hours)      Critically ill patients:  140 - 180 mg/dL   Lab Results  Component Value Date   GLUCAP 305 (H) 06/09/2021   HGBA1C 11.0 (H) 06/06/2021    Review of Glycemic Control  Latest Reference Range & Units 06/08/21 11:30 06/08/21 16:41 06/08/21 20:40 06/09/21 05:59  Glucose-Capillary 70 - 99 mg/dL 233 (H) 319 (H) 437 (H) 305 (H)  (H): Data is abnormally high  Diabetes history: DM2 Outpatient Diabetes medications: V-Go 30 and takes 3-5 clicks (2 units per click) with meals Current orders for Inpatient glycemic control: Insulin pump per home settings  Inpatient Diabetes Program Recommendations:    Please consider adding Novolog 0-15 units TID & 0-5 QHS   Spoke with patient at length at bedside.  Glucose trend continue to be elevated.  Insulin pump in place and he is bolusing for meals.  A1C 11%.  Discussed need for insulin adjustments after DC.  Provided him with endocrinologists contact information as he would benefit from this for better glucose control.  He is interested in Using a different type of pump that will communicate with his Dexcom such as the T-Slim/Tandem insulin pump. Discussed importance of optimal glucose control for healing.  He should follow up ASAP with PCP or endocrinology for insulin adjustments.    Will continue to follow while inpatient.  Thank you, Reche Dixon, RN, BSN Diabetes Coordinator Inpatient Diabetes Program [phone] (team pager from 8a-5p)

## 2021-06-10 LAB — GLUCOSE, CAPILLARY
Glucose-Capillary: 227 mg/dL — ABNORMAL HIGH (ref 70–99)
Glucose-Capillary: 152 mg/dL — ABNORMAL HIGH (ref 70–99)
Glucose-Capillary: 193 mg/dL — ABNORMAL HIGH (ref 70–99)
Glucose-Capillary: 223 mg/dL — ABNORMAL HIGH (ref 70–99)
Glucose-Capillary: 200 mg/dL — ABNORMAL HIGH (ref 70–99)

## 2021-06-10 LAB — BASIC METABOLIC PANEL
Anion gap: 9 (ref 5–15)
BUN: 17 mg/dL (ref 8–23)
CO2: 21 mmol/L — ABNORMAL LOW (ref 22–32)
Calcium: 8.4 mg/dL — ABNORMAL LOW (ref 8.9–10.3)
Chloride: 101 mmol/L (ref 98–111)
Creatinine, Ser: 1.63 mg/dL — ABNORMAL HIGH (ref 0.61–1.24)
GFR, Estimated: 45 mL/min — ABNORMAL LOW (ref >60)
Glucose, Bld: 163 mg/dL — ABNORMAL HIGH (ref 70–99)
Potassium: 3.7 mmol/L (ref 3.5–5.1)
Sodium: 131 mmol/L — ABNORMAL LOW (ref 135–145)

## 2021-06-10 NOTE — Progress Notes (Signed)
Pharmacy Antibiotic Note  [name] is a 70 y.o. male admitted on 06/06/2021  for surgery to address fluid collection left lower extremity. Now S/p I&D of LLE fluid collection, drain placed 06/06/21.  Pharmacy has been consulted 12/13 for Vancomycin dosing for wound infection.  Ciprofloxacin added 12/16 for klebsiella and morganella coverage per ID. Will receive abx for 4wk (through 07/03/2021).   Plan: Vancomycin 1250 mg IV every 24 hours (eAUC 481.3, Cmax 32, SCr 1.63, Vd 0.5) Ciprofloxacin per ID  Monitor clinical progress, renal function, F/U on repeat BCx Obtain levels as indicated.  Height: 6\' 2"  (188 cm) Weight: 115.2 kg (254 lb) IBW/kg (Calculated) : 82.2  Temp (24hrs), Avg:98.5 F (36.9 C), Min:98.1 F (36.7 C), Max:98.8 F (37.1 C)  Recent Labs  Lab 06/06/21 0620 06/06/21 1342 06/10/21 0114  WBC  --  9.5  --   CREATININE 1.60* 1.64* 1.63*     Estimated Creatinine Clearance: 56.9 mL/min (A) (by C-G formula based on SCr of 1.63 mg/dL (H)).    No Known Allergies  Antimicrobials this admission: Vanc 12/13>>(1/9) Cipro 12/16 >>(1/9) Cefazolin 12/13 x1 Zosyn 12/13>>12/16  Microbiology results: 12/15 BCx: NGTD 12/13 wound left leg gram stain: morganella (R: amp, cefazolin, unasyn), klebsiella (R: amp, cefazolin), MRSA (R: oxacillin, cipro) 12/13 MRSA Positive   Laurey Arrow, PharmD PGY1 Pharmacy Resident 06/10/2021  11:18 AM  Please check [email or web address] for unit-specific pharmacy phone numbers.

## 2021-06-10 NOTE — Progress Notes (Addendum)
[facility]  Subjective  - No new complaints   Objective 117/68 64 98.4 F (36.9 C) (Oral) 19 98%  Intake/Output Summary (Last 24 hours) at 06/10/2021 0834 Last data filed at 06/10/2021 9507 Gross per 24 hour  Intake 2964.17 ml  Output 1650 ml  Net 1314.17 ml    Left Continues to have turbid serosanguinous drainage from both wound openings. Ulcer on left foot and anterior leg stable. Xeroform, Kerlix, ABDs reapplied followed by ACE.  Clean dressing reapplied Motor intact left LE Lungs non labored  Heart RRR  Assessment/Planning: POD # 4 70 y.o. male is s/p I&D left lower extremity  Left LE well perfused with intact motor and sensation.  He is ambulating in his room to the bathroom Continue drainage pending culture ID following  DM control with SQ insulin   [name] 06/10/2021 8:34 AM --  VASCULAR STAFF ADDENDUM: I have independently interviewed and examined the patient. I agree with the above.  Pending culture speciation / finalization  Cassandria Santee, MD [facility] Phone Number: [phone] 06/10/2021 9:31 AM     Laboratory Lab Results: No results for input(s): WBC, HGB, HCT, PLT in the last 72 hours. BMET Recent Labs    06/10/21 0114  NA 131*  K 3.7  CL 101  CO2 21*  GLUCOSE 163*  BUN 17  CREATININE 1.63*  CALCIUM 8.4*    COAG Lab Results  Component Value Date   INR 1.9 (H) 10/23/2019   INR 3.0 (H) 10/23/2019   INR 1.3 (H) 10/21/2019   No results found for: PTT

## 2021-06-11 LAB — AEROBIC/ANAEROBIC CULTURE W GRAM STAIN (SURGICAL/DEEP WOUND)

## 2021-06-11 LAB — GLUCOSE, CAPILLARY
Glucose-Capillary: 301 mg/dL — ABNORMAL HIGH (ref 70–99)
Glucose-Capillary: 261 mg/dL — ABNORMAL HIGH (ref 70–99)
Glucose-Capillary: 235 mg/dL — ABNORMAL HIGH (ref 70–99)
Glucose-Capillary: 152 mg/dL — ABNORMAL HIGH (ref 70–99)

## 2021-06-11 NOTE — Progress Notes (Addendum)
[facility]  Subjective  - No new complaints   Objective 138/76 62 98.5 F (36.9 C) (Oral) 18 97%  Intake/Output Summary (Last 24 hours) at 06/11/2021 0447 Last data filed at 06/11/2021 0332 Gross per 24 hour  Intake 2332.43 ml  Output 2570 ml  Net -237.57 ml    Left LE continuous turbid serosanguinous drainage from both wound openings. Ulcer on left foot and anterior leg stable. Xeroform, Kerlix, ABDs reapplied followed by ACE.  Clean dressing reapplied Motor and sensation intact at baseline left LE Lungs non labored  Heart RRR  Assessment/Planning: POD #5 s/p I&D left lower extremity lateral leg  Left LE remains viable for function Continue dressing changes maintain pen rose drain  Cultures final pending  Gram Stain NO WBC SEEN  NO ORGANISMS SEEN   Culture FEW METHICILLIN RESISTANT STAPHYLOCOCCUS AUREUS  NO ANAEROBES ISOLATED    Plan per ID -D/C cefepime -Start ciprofloxacin PO(cover klebsiella and morgenella), obtain EKG for qtc.  -Continue vancomycin   [name] 06/11/2021 4:47 AM --  VASCULAR STAFF ADDENDUM: I have independently interviewed and examined the patient. I agree with the above.  Wound continue to drain murky, purulent fluid Will continue to follow up cultures  Cassandria Santee, MD [facility] Phone Number: [phone] 06/11/2021 11:12 AM    Laboratory Lab Results: No results for input(s): WBC, HGB, HCT, PLT in the last 72 hours. BMET Recent Labs    06/10/21 0114  NA 131*  K 3.7  CL 101  CO2 21*  GLUCOSE 163*  BUN 17  CREATININE 1.63*  CALCIUM 8.4*    COAG Lab Results  Component Value Date   INR 1.9 (H) 10/23/2019   INR 3.0 (H) 10/23/2019   INR 1.3 (H) 10/21/2019   No results found for: PTT

## 2021-06-11 NOTE — Progress Notes (Signed)
[facility] for Infectious Disease  Date of Admission:  06/06/2021   Total days of inpatient antibiotics 6  Principal Problem:   PAD (peripheral artery disease) [facility])          Assessment: 70 year old male with history of stent placement in distal left SFA in 2019 subsequently developed rapid stent thrombosis. femoral-peroneal bypass utilizing pt's saphenous vein in June 3491 complicated by multiple bouts of infection including MRSA.  Admitted for left calf wound along posterior calf surrounding the stent.    #LLE abscess surrounding occluded stent SP I&D on 12/13 with poly microbial cx(MRSA GBS, morgenella morganii and klebsiella oxytoca) #Drain in place of post surgical leg wound #Chronically occluded stent #Uncontrolled DM (A1C 11 on 06/06/21) leading to poor wound healing -Previous abscess in 2022 SP I&D in April, 2021 was originating at stent as well. ID was consulted and did not feel stent was infected but rather thrombosed. As Cx+ MRSA pt treated with doxycyline for 2 weeks post op.  -In addition to chronically thrombosed stent,  vascular interventions have likely lead to  poor lymphatics which is contributing to increasing likelihood of abscess formation.  -As abscess around stent is recurrent would consider occluded stent removal. I spoke with Dr. Donzetta Matters and stent removal may not be feasible without causing further damage.   Recommendations:  -Continue ciprofloxacin PO(cover klebsiella and morgenella), QTC 416 on 06/11/21 -Continue vancomycin while inpatient. Transition vancomycin to doxycyline on discharge -Plan to treat with 4 weeks of antibiotics from OR (12/13-07/03/21) -I discussed the importance of wound care and glycemic control to improve chances of wound healing with patient.   ID will sign off, ID appointment follow-up appointment scheduled.   Microbiology:   Antibiotics: Cefaxolin 12/13 Vancomycin 12/13-p Pip-tazo 12/13-12/15 Cultures: OR Cx:  Moderate MRSA, GBS, Morgenella morgenaii and Klebsiella oxytoca Morganella morganii Klebsiella oxytoca      MIC MIC    AMPICILLIN >=32 RESIST... Resistant >=32 RESIST... Resistant    AMPICILLIN/SULBACTAM >=32 RESIST... Resistant 8 SENSITIVE  Sensitive    CEFAZOLIN >=64 RESIST... Resistant >=64 RESIST... Resistant    CEFEPIME   <=0.12 SENS... Sensitive    CEFTAZIDIME <=1 SENSITIVE  Sensitive <=1 SENSITIVE  Sensitive    CEFTRIAXONE   <=0.25 SENS... Sensitive    CIPROFLOXACIN <=0.25 SENS... Sensitive <=0.25 SENS... Sensitive    GENTAMICIN <=1 SENSITIVE  Sensitive <=1 SENSITIVE  Sensitive    IMIPENEM 2 SENSITIVE  Sensitive <=0.25 SENS... Sensitive    PIP/TAZO <=4 SENSITIVE  Sensitive <=4 SENSITIVE  Sensitive    TRIMETH/SULFA <=20 SENSIT... Sensitive <=20 SENSIT... Sensitive     SUBJECTIVE: Pt. Is resting in bed. Wife at bedside. I discussed the antibiotic plan with the patient.   Interval: Afebrile overnight. No new complaints  Review of Systems: Review of Systems  All other systems reviewed and are negative.   Scheduled Meds:  ciprofloxacin  500 mg Oral BID   clopidogrel  75 mg Oral Daily   colchicine  0.6 mg Oral QHS   ferrous sulfate  325 mg Oral Q breakfast   heparin  5,000 Units Subcutaneous Q8H   insulin aspart  0-15 Units Subcutaneous TID WC   insulin aspart  0-5 Units Subcutaneous QHS   insulin pump   Subcutaneous TID WC, HS, 0200   latanoprost  1 drop Both Eyes QHS   losartan  100 mg Oral Q lunch   mupirocin ointment  1 application Nasal BID   pantoprazole  40 mg Oral Daily  potassium chloride  20-40 mEq Oral Once   pravastatin  40 mg Oral Q lunch   pregabalin  50 mg Oral Q supper   triamterene-hydrochlorothiazide  1 tablet Oral Q lunch   Continuous Infusions:  sodium chloride 50 mL/hr at 06/11/21 1702   vancomycin 1,250 mg (06/11/21 0926)   PRN Meds:.acetaminophen, alum & mag hydroxide-simeth, guaiFENesin-dextromethorphan, hydrALAZINE, HYDROmorphone (DILAUDID)  injection, labetalol, metoprolol tartrate, ondansetron, oxyCODONE, phenol No Known Allergies  OBJECTIVE: Vitals:   06/11/21 0331 06/11/21 0849 06/11/21 1153 06/11/21 1654  BP: 138/76 138/86 119/70 (!) 118/91  Pulse: 62 71 62 74  Resp: 18 20 17 20   Temp: 98.5 F (36.9 C) 97.8 F (36.6 C) 98.2 F (36.8 C) 98.1 F (36.7 C)  TempSrc: Oral Oral Oral Oral  SpO2: 97% 99% 100% 98%  Weight:      Height:       Body mass index is 32.61 kg/m.  Physical Exam Constitutional:      General: He is not in acute distress.    Appearance: He is normal weight. He is not toxic-appearing.  HENT:     Head: Normocephalic and atraumatic.     Right Ear: External ear normal.     Left Ear: External ear normal.     Nose: No congestion or rhinorrhea.     Mouth/Throat:     Mouth: Mucous membranes are moist.     Pharynx: Oropharynx is clear.  Eyes:     Extraocular Movements: Extraocular movements intact.     Conjunctiva/sclera: Conjunctivae normal.     Pupils: Pupils are equal, round, and reactive to light.  Cardiovascular:     Rate and Rhythm: Normal rate and regular rhythm.     Heart sounds: No murmur heard.   No friction rub. No gallop.  Pulmonary:     Effort: Pulmonary effort is normal.     Breath sounds: Normal breath sounds.  Abdominal:     General: Abdomen is flat. Bowel sounds are normal.     Palpations: Abdomen is soft.  Musculoskeletal:        General: No swelling. Normal range of motion.     Cervical back: Normal range of motion and neck supple.  Skin:    General: Skin is warm and dry.     Comments: LLE bandaged  Neurological:     General: No focal deficit present.     Mental Status: He is oriented to person, place, and time.  Psychiatric:        Mood and Affect: Mood normal.      Lab Results Lab Results  Component Value Date   WBC 9.5 06/06/2021   HGB 9.8 (L) 06/06/2021   HCT 31.2 (L) 06/06/2021   MCV 76.3 (L) 06/06/2021   PLT 291 06/06/2021    Lab Results   Component Value Date   CREATININE 1.63 (H) 06/10/2021   BUN 17 06/10/2021   NA 131 (L) 06/10/2021   K 3.7 06/10/2021   CL 101 06/10/2021   CO2 21 (L) 06/10/2021    Lab Results  Component Value Date   ALT 13 06/06/2021   AST 17 06/06/2021   ALKPHOS 94 06/06/2021   BILITOT 0.4 06/06/2021        Laurice Record, [place] [facility] 06/11/2021, 6:36 PM

## 2021-06-12 LAB — GLUCOSE, CAPILLARY
Glucose-Capillary: 151 mg/dL — ABNORMAL HIGH (ref 70–99)
Glucose-Capillary: 198 mg/dL — ABNORMAL HIGH (ref 70–99)
Glucose-Capillary: 225 mg/dL — ABNORMAL HIGH (ref 70–99)

## 2021-06-12 MED ORDER — DOXYCYCLINE HYCLATE 100 MG PO TABS: 100.0000 mg | ORAL_TABLET | Freq: Two times a day (BID) | ORAL | 0 refills | Status: AC

## 2021-06-12 MED ORDER — TRAMADOL HCL 50 MG PO TABS
50.0000 mg | ORAL_TABLET | Freq: Every evening | ORAL | 0 refills | Status: DC | PRN
Start: 2021-06-12 — End: 2021-10-25

## 2021-06-12 MED ORDER — CIPROFLOXACIN HCL 500 MG PO TABS
500.0000 mg | ORAL_TABLET | Freq: Two times a day (BID) | ORAL | 0 refills | Status: AC
Start: 2021-06-12 — End: 2021-07-03

## 2021-06-12 NOTE — Progress Notes (Addendum)
°  Progress Note    06/12/2021 7:51 AM 6 Days Post-Op  Subjective:  No new complaints   Vitals:   06/11/21 2315 06/12/21 0327  BP: (!) 144/79 101/72  Pulse: 61 65  Resp: 16 17  Temp: 98.6 F (37 C) 98.1 F (36.7 C)  SpO2: 96% 97%   Physical Exam: Lungs:  non labored Incisions:  serosanguinous drainage from lateral incisions, some odor; foot ulcer with healthy base, some granulation Extremities:  foot is warm with good cap refill Neurologic: A&O  CBC    Component Value Date/Time   WBC 9.5 06/06/2021 1342   RBC 4.09 (L) 06/06/2021 1342   HGB 9.8 (L) 06/06/2021 1342   HCT 31.2 (L) 06/06/2021 1342   PLT 291 06/06/2021 1342   MCV 76.3 (L) 06/06/2021 1342   MCH 24.0 (L) 06/06/2021 1342   MCHC 31.4 06/06/2021 1342   RDW 19.1 (H) 06/06/2021 1342   LYMPHSABS 1.6 05/30/2021 1223   MONOABS 0.9 05/30/2021 1223   EOSABS 0.5 05/30/2021 1223   BASOSABS 0.0 05/30/2021 1223    BMET    Component Value Date/Time   NA 131 (L) 06/10/2021 0114   K 3.7 06/10/2021 0114   CL 101 06/10/2021 0114   CO2 21 (L) 06/10/2021 0114   GLUCOSE 163 (H) 06/10/2021 0114   BUN 17 06/10/2021 0114   BUN 24 05/31/2021 1222   CREATININE 1.63 (H) 06/10/2021 0114   CALCIUM 8.4 (L) 06/10/2021 0114   GFRNONAA 45 (L) 06/10/2021 0114   GFRAA >60 10/29/2019 0226    INR    Component Value Date/Time   INR 1.9 (H) 10/23/2019 0618     Intake/Output Summary (Last 24 hours) at 06/12/2021 0751 Last data filed at 06/12/2021 0328 Gross per 24 hour  Intake 720 ml  Output 1620 ml  Net -900 ml     Assessment/Plan:  70 y.o. male is s/p I&D LLE 6 Days Post-Op   L foot warm and well perfused Continuous serosanguinous drainage from L lateral leg; continue dry dressing changes ID recommending cipro and doxy at discharge for a total of 4 weeks from surgery Patient is active with [facility] care however he will likely require more frequent assessment and complex wound care Home when wound care  plan is arranged   Dagoberto Ligas, PA-C [facility] [phone] 06/12/2021 7:51 AM  I have independently interviewed and examined patient and agree with PA assessment and plan above. Plan is for po abx and home when home health care arranged.   Maddyx Wieck C. Donzetta Matters, MD [facility] [place] Office: [phone] Pager: [phone]

## 2021-06-12 NOTE — Discharge Instructions (Addendum)
Cleanse left leg with soap and water daily and pat to dry Apply xeroform gauze to open ulcers of left foot and ankle Apply abd pads to open incisions of lateral leg Wrap leg with kerlex and ACE from toe to thigh Drain will remain in place until vascular office visit in 2-3 weeks

## 2021-06-12 NOTE — Care Management (Signed)
1232 06-12-21 Case Manager spoke with the patient regarding consult for [facility] RN. Patient states he wants to wait until his appointment tomorrow at the wound care clinic @ 1345 to see if the clinic can determine if they can manage the wound with the drain. Per patient, if the clinic cannot support the wound care; patient will then be willing to accept the [facility] RN to be set up as outpatient. Patient will not have a [facility] RN for transition home. Case Manager did send a secure chat to the Vascular PA. No further needs notified at this time.

## 2021-06-12 NOTE — Progress Notes (Signed)
Patient discharged to home in private vehicle with his spouse. Discharge orders reviewed with patient and spouse. Both parties verbalized understanding. VS stable, Iv removed by RN and personal belongings sent with patient. Fuller Canada, RN

## 2021-06-13 ENCOUNTER — Encounter (HOSPITAL_BASED_OUTPATIENT_CLINIC_OR_DEPARTMENT_OTHER): Payer: BC Managed Care – PPO | Admitting: Internal Medicine

## 2021-06-13 ENCOUNTER — Other Ambulatory Visit: Payer: Self-pay

## 2021-06-13 DIAGNOSIS — E11621 Type 2 diabetes mellitus with foot ulcer: Secondary | ICD-10-CM | POA: Diagnosis present

## 2021-06-13 DIAGNOSIS — I129 Hypertensive chronic kidney disease with stage 1 through stage 4 chronic kidney disease, or unspecified chronic kidney disease: Secondary | ICD-10-CM | POA: Diagnosis not present

## 2021-06-13 DIAGNOSIS — L97828 Non-pressure chronic ulcer of other part of left lower leg with other specified severity: Secondary | ICD-10-CM | POA: Diagnosis not present

## 2021-06-13 DIAGNOSIS — L02416 Cutaneous abscess of left lower limb: Secondary | ICD-10-CM | POA: Diagnosis not present

## 2021-06-13 DIAGNOSIS — E1122 Type 2 diabetes mellitus with diabetic chronic kidney disease: Secondary | ICD-10-CM | POA: Diagnosis not present

## 2021-06-13 DIAGNOSIS — I890 Lymphedema, not elsewhere classified: Secondary | ICD-10-CM | POA: Diagnosis not present

## 2021-06-13 DIAGNOSIS — E1151 Type 2 diabetes mellitus with diabetic peripheral angiopathy without gangrene: Secondary | ICD-10-CM | POA: Diagnosis not present

## 2021-06-13 DIAGNOSIS — N183 Chronic kidney disease, stage 3 unspecified: Secondary | ICD-10-CM | POA: Diagnosis not present

## 2021-06-13 DIAGNOSIS — E1142 Type 2 diabetes mellitus with diabetic polyneuropathy: Secondary | ICD-10-CM | POA: Diagnosis not present

## 2021-06-13 DIAGNOSIS — L97528 Non-pressure chronic ulcer of other part of left foot with other specified severity: Secondary | ICD-10-CM | POA: Diagnosis not present

## 2021-06-13 LAB — CULTURE, BLOOD (ROUTINE X 2)
Culture: NO GROWTH
Culture: NO GROWTH
Special Requests: ADEQUATE
Special Requests: ADEQUATE

## 2021-06-13 NOTE — Discharge Summary (Signed)
Discharge Summary  Patient ID: [name] [id number] 70 y.o. Oct 19, 1950  Admit date: 06/06/2021  Discharge date and time: 06/12/2021  3:33 PM   Admitting Physician: Waynetta Sandy, MD   Discharge Physician: same  Admission Diagnoses: PAD (peripheral artery disease) [facility]) [I73.9]  Discharge Diagnoses: PAD  Admission Condition: fair  Discharged Condition: fair  Indication for Admission: fluid collection left leg  Hospital Course: [name] is a 70 year old male who was seen in the office and noted to have a large fluid collection in his left lower extremity with prior bypass.  He was brought to the operating room as an outpatient on 06/06/2021 and underwent incision and drainage of left leg fluid collection with placement of Penrose drain.  He tolerated the procedure well and was admitted to the hospital postoperatively.  His hospital stay consisted of strict wound care with daily dressing changes.  Over the course of his hospital stay edema of the left lower extremity has improved.  Intraoperative cultures were also obtained and infectious disease was consulted.  Recommendations included 4 weeks of doxycycline in addition to Cipro.  At the time of discharge drain was kept in place of the lateral left leg.  He will follow-up in office in about 2 weeks.  He will resume outpatient wound care with the [facility] and Dr. Dellia Nims.  He was also provided narcotic pain medication for continued postoperative pain control.  He was discharged home in stable condition.  Consults: None  Treatments: surgery: Incision and drainage of left lower extremity fluid collection by Dr. Donzetta Matters on 06/06/2021  Discharge Exam: See progress note 06/13/19 Vitals:   06/12/21 0755 06/12/21 1118  BP: 119/74 (!) 148/82  Pulse: 61 60  Resp: 20 20  Temp: 98.6 F (37 C) 97.7 F (36.5 C)  SpO2: 95% 98%     Disposition: Discharge disposition: 01-Home or Self  Care       Patient Instructions:  Allergies as of 06/12/2021   No Known Allergies      Medication List     TAKE these medications    acetaminophen 650 MG CR tablet Commonly known as: TYLENOL Take 650 mg by mouth 2 (two) times daily as needed for pain.   ciprofloxacin 500 MG tablet Commonly known as: CIPRO Take 1 tablet (500 mg total) by mouth 2 (two) times daily for 21 days.   clopidogrel 75 MG tablet Commonly known as: PLAVIX TAKE 1 TABLET(75 MG) BY MOUTH DAILY   Colchicine 0.6 MG Caps Take 0.6 mg by mouth at bedtime.   doxycycline 100 MG tablet Commonly known as: VIBRA-TABS Take 1 tablet (100 mg total) by mouth 2 (two) times daily for 21 days.   EQL MEGA SELECT MENS PO Take 1 tablet by mouth daily.   ferrous sulfate 325 (65 FE) MG tablet Take 1 tablet (325 mg total) by mouth 2 (two) times daily with a meal. What changed: when to take this   losartan 100 MG tablet Commonly known as: COZAAR Take 100 mg by mouth daily with lunch.   NovoLOG 100 UNIT/ML injection Generic drug: insulin aspart INJECT AS DIRECTED WITH VGO 30   OneTouch Ultra test strip Generic drug: glucose blood SMARTSIG:Via Meter   pravastatin 40 MG tablet Commonly known as: PRAVACHOL Take 40 mg by mouth daily with lunch.   pregabalin 50 MG capsule Commonly known as: LYRICA Take 50 mg by mouth daily with supper.   traMADol 50 MG tablet Commonly known as: ULTRAM Take  1 tablet (50 mg total) by mouth at bedtime as needed for moderate pain.   Travoprost (BAK Free) 0.004 % Soln ophthalmic solution Commonly known as: TRAVATAN Place 1 drop into both eyes at bedtime.   triamterene-hydrochlorothiazide 37.5-25 MG tablet Commonly known as: MAXZIDE-25 Take 1 tablet by mouth daily with lunch.   V-Go 30 Kit Inject 1.25 Units into the skin continuous. 1.25 units per hour  Uses Novolog insulin in pump Vego 30  pump self contained       Activity: activity as tolerated Diet: regular  diet Wound Care: keep wound clean and dry  Follow-up with [facility] in 2 weeks.  Signed: Dagoberto Ligas, PA-C 06/13/2021 9:48 AM [facility] Office: [phone]

## 2021-06-14 NOTE — Progress Notes (Signed)
TRAYTON, SZABO ([id number]) Visit Report for 06/13/2021 HPI Details Patient Name: Date of Service: [name], [name] 06/13/2021 1:45 PM Medical Record Number: [id number] Patient Account Number: [id number] Date of Birth/Sex: Treating RN: 1950/08/04 (70 y.o. [name] Primary Care Provider: [name] Other Clinician: Referring Provider: Treating Provider/Extender: Stormy Card in Treatment: 8 History of Present Illness HPI Description: 10/11/17; [name] is a 71 year old man who tells me that in 2015 he slipped down the latter traumatizing his left leg. He developed a wound in the same spot the area that we are currently looking at. He states this closed over for the most part although he always felt it was somewhat unstable. In 2016 he hit the same area with the door of his car had this reopened. He tells me that this is never really closed although sometimes an inflow it remains open on a constant basis. He has not been using any specific dressing to this except for topical antibiotics the nature of which were not really sure. His primary doctor did send him to see Dr. [name] of interventional cardiology. He underwent an angiogram on 08/06/17 and he underwent a PTA and directional atherectomy of the lesser distal SFA and popliteal arteries which resulted in brisk improvement in blood flow. It was noted that he had 2 vessel runoff through the anterior tibial and peroneal. He is also been to see vascular and interventional radiologist. He was not felt to have any significant superficial venous insufficiency. Presumably is not a candidate for any ablation. It was suggested he come here for wound care. The patient is a type II diabetic on insulin. He also has a history of venous insufficiency. ABIs on the left were noncompressible in our clinic 10/21/17; patient we admitted to the clinic last week. 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 We put Iodosorb on him after an aggressive debridement and 3 layer compression. He complained of pain in his ankle and itching with is skin in fact he scratched the area on the medial calf superiorly at the rim of our wraps and he has 2 small open areas in that location today which are new. I changed his primary dressing today to silver collagen. As noted he is already had revascularization and does not have any significant superficial venous insufficiency that would be amenable to ablation 10/28/17; patient admitted to the clinic 2 weeks ago. He has a smaller Wound. Scratch injury from last week revealed. There is large wound over the tibial area. This is smaller. Granulation looks healthy. No need for debridement. 11/04/17; the wound on the left lateral calf looks better. Improved dimensions. Surface of this looks better. We've been maintaining him and Kerlix Coban wraps. He finds this much more comfortable. Silver collagen dressing 11/11/17; left lateral Wound continues to look healthy be making progress. Using a #5 curet I removed removed nonviable skin from the surface of the wound and then necrotic debris from the wound surface. Surface of the wound continues to look healthy. He also has an open area on the left great toenail bed. We've been using topical antibiotics. 11/19/17; left anterior lateral wound continues to look healthy but it's not closed. He also had a small wound above this on the left leg Initially traumatic wounds in the setting of significant chronic venous insufficiency and stasis dermatitis 11/25/17; left anterior wounds superiorly is closed still a small wound inferiorly. 12/02/17;  left anterior tibial area. Arrives today with adherent callus. Post debridement clearly not completely closed. Hydrofera Blue under 3 layer compression. 12/09/17; left anterior tibia. Circumferential eschar however the wound bed looks stable to improved. We've been  using Hydrofera Blue under 3 layer compression 12/17/17; left anterior tibia. Apparently this was felt to be closed however when the wrap was taken off there is a skin tear to reopen wounds in the same area we've been using Hydrofera Blue under 3 layer compression 12/23/17 left anterior tibia. Not close to close this week apparently the [facility] was stuck to this again. Still circumferential eschar requiring debridement. I put a contact layer on this this time under the Hydrofera Blue 12/31/17; left anterior tibia. Wound is better slight amount of hyper-granulation. Using Hydrofera Blue over Adaptic. 01/07/18; left anterior tibia. The wound had some surface eschar however after this was removed he has no open wound.he was already revascularized by Dr. [name] when he came to our clinic with atherectomy of the left SFA and popliteal artery. He was also sent to interventional radiology for venous reflux studies. He was not felt to have significant reflux but certainly has chronic venous changes of his skin with hemosiderin deposition around this area. He will definitely need to lubricate his skin and wear compression stocking and I've talked to him about this. READMISSION 05/26/2018 This is a now 70 year old man we cared for with traumatic wounds on his left anterior lower extremity. He had been previously revascularized during that admission by Dr. [name]. Apparently in follow-up Dr. [name] noted that he had deterioration in his arterial status. He underwent a stent placement in the distal left SFA on 04/22/2018. 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 He went back to the angiography suite on 04/30/2018 he underwent PTA and balloon angioplasty of the occluded left mid anterior tibial artery, thrombotic occlusion went from 100 to 0% which reconstitutes the posterior tibial artery. He had thrombectomy and aspiration of the peroneal artery. The stent placed in the distal SFA left SFA was  still occluded. 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 He has been recuperating at [place] home in [facility] after the most recent hospitalization. He is going home tomorrow. He tells me that 2 weeks ago he traumatized the tip of his left fifth toe. He came in urgently for our review of this. This was a history of before I noted that Dr. [name] had already noted dry gangrenous changes of the left fifth toe 06/09/2018; 2-week follow-up. I did contact Dr. [name] after his last appointment and he apparently saw 1 of Dr. Irven Shelling colleagues the next day. He does not follow-up with Dr. [name] himself until Thursday of this week. 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 Nevertheless there is no evidence of infection no drainage and no pain. He had a new area that this week when we were signing him in today on the left anterior mid tibia area, this is in close proximity to the previous wound we have dealt with in this clinic. 06/23/2018; 2-week follow-up. I did not receive a recent note from Dr. [name] to review today. Our office is trying to obtain this. He is apparently  not planning to do further vascular interventions and wondered about compression to try and help with the patient's chronic venous insufficiency. However we are also concerned about the arterial flow. He arrives in clinic today with a new area on the left third toe. The areas on the calf/anterior tibia are close to closing. The left fifth toe is still mummified using Betadine. -In reviewing things with the patient he has what sounds like claudication with mild to moderate amount of activity. 06/27/2018; x-ray of his foot suggested osteomyelitis of the left third  toe. I prescribed Levaquin over the phone while we attempted to arrange a plan of care. However the patient called yesterday to report he had low-grade fever and he came in today acutely. There is been a marked deterioration in the left third toe with spreading cellulitis up into the dorsal left foot. He was referred to the emergency room. Readmission: 06/29/2020 patient presents today for reevaluation here in our clinic he was previously treated by Dr. Dellia Nims at the latter part of 2019 in 2 the beginning of 2020. Subsequently we have not seen him since that time in the interim he did have evaluation with vein and vascular specialist specifically Dr. Anice Paganini who did perform quite extensive work for a left femoral to anterior tibial artery bypass. With that being said in the interim the patient has developed significant lymphedema and has wounds that he tells me have really never healed in regard to the incision site on the left leg. He also has multiple wounds on the feet for various reasons some of which is that he tends to pick at his feet. Fortunately there is no signs of active infection systemically at this time he does have some wounds that are little bit deeper but most are fairly superficial he seems to have good blood flow and overall everything appears to be healthy I see no bone exposed and no obvious signs of osteomyelitis. I do not know that he necessarily needs a x-ray at this point although that something we could consider depending on how things progress. The patient does have a history of lymphedema, diabetes, this is type II, chronic kidney disease stage III, hypertension, and history of peripheral vascular disease. 07/05/2020; patient admitted last week. Is a patient I remember from 2019 he had a spreading infection involving the left foot and we sent him to the hospital. 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 He subsequently had a left  femoral to anterior tibial bypass by Dr.Cain vein and vascular. He also has severe lymphedema with chronic skin changes related to that on the left leg. The most problematic area that was new today was on the left medial great toe. This was apparently a small area last week there was purulent drainage which our intake nurse cultured. Also areas on the left medial foot and heel left lateral foot. He has 2 areas on the left medial calf left lateral calf in the setting of the severe lymphedema. 07/13/2020 on evaluation today patient appears to be doing better in my opinion compared to his last visit. The good news is there is no signs of active infection systemically and locally I do not see any signs of infection either. He did have an x-ray which was negative that is great news he had a culture which showed MRSA but at the same time he is been on the doxycycline which has helped. I do think we may  want to extend this for 7 additional days 1/25; patient admitted to the clinic a few weeks ago. 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 We have been putting him under compression his edema control is a lot better but he is severe verricused skin on the left leg. He is really done quite well he still has an open area on the left medial calf and the left medial first metatarsal head. We have been using silver collagen on the leg silver alginate on the foot 07/27/2020 upon evaluation today patient appears to be doing decently well in regard to his wounds. He still has a lot of dry skin on the left leg. Some of this is starting to peel back and I think he may be able to have them out by removing some that today. Fortunately there is no signs of active infection at this time on the left leg although on the right leg he does appear to have swelling and erythema as well as some mild warmth to touch. This does have been concerned about the possibility of cellulitis although  within the differential diagnosis I do think that potentially a DVT has to be at least considered. We need to rule that out before proceeding would just call in the cellulitis. Especially since he is having pain in the posterior aspect of his calf muscle. 2/8; the patient had seen sparingly. He has severe skin changes of chronic lymphedema in the left leg thickened hyperkeratotic verrucous skin. He has an open wound on the medial part of the left first met head left mid tibia. He also has a rim of nonepithelialized skin in the anterior mid tibia. He brought in the AmLactin lotion that was been prescribed although I am not sure under compression and its utility. There concern about cellulitis on the right lower leg the last time he was here. He was put on on antibiotics. His DVT rule out was negative. The right leg looks fine he is using his stocking on this area 08/10/2020 upon evaluation today patient appears to be doing well with regard to his leg currently. He has been tolerating the dressing changes without complication. Fortunately there is no signs of active infection which is great news. Overall very pleased with where things stand. 2/22; the patient still has an area on the medial part of the left first met his head. This looks better than when I last saw this earlier this month he has a rim of epithelialization but still some surface debris. Mostly everything on the left leg is healed. There is still a vulnerable in the left mid tibia area. 08/30/2020 upon evaluation today patient appears to be doing much better in regard to his wounds on his foot. 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 Nonetheless I think we will address that today I Minna send in a prescription for him in that regard. Overall though there does not appear to be any signs of significant  worsening. 09/07/2020 on evaluation today patient's wounds over his left foot appear to be doing excellent. I do not see any signs of infection there is some callus buildup this can require debridement for certain but overall I feel like he is managing quite nicely. He still using the AmLactin cream which has been beneficial for him as well. 3/22; left foot  wound is closed. There is no open area here. He is using ammonium lactate lotion to the lower extremities to help exfoliate dry cracked skin. He has compression stockings from elastic therapy in [place]. The wound on the medial part of his left first met head is healed today. READMISSION 04/12/2021 Mr. Gasaway is a patient we know fairly well he had a prolonged stay in clinic in 2019 with wounds on his left lateral and left anterior lower extremity in the setting of chronic venous insufficiency. More recently he was here earlier this year with predominantly an area on his left foot first metatarsal head plantar and he says the plantar foot broke down on its not long after we discharged him but he did not come back here. The last few months areas of broken down on his left anterior and again the left lateral lower extremity. The leg itself is very swollen chronically enlarged a lot of hyperkeratotic dry Berry Q skin in the left lower leg. His edema extends well into the thigh. He was seen by Dr. Donzetta Matters. He had ABIs on 03/02/2021 showing an ABI on the right of 1 with a TBI of 0.72 his ABI in the left at 1.09 TBI of 0.99. Monophasic and biphasic waveforms on the right. On the left monophasic waveforms were noted he went on to have an angiogram on 03/27/2021 this showed the aortic aortic and iliac segments were free of flow-limiting stenosis the left common femoral vein to evaluate the left femoral to anterior tibial artery bypass was unobstructed the bypass was patent without any areas of stenosis. We discharged the patient in bilateral juxta lite  stockings but very clearly that was not sufficient to control the swelling and maintain skin integrity. He is clearly going to need compression pumps. 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 This is fortunate because he is on his feet for long periods of time. 10/27; patient comes in with our intake nurse reporting copious amount of green drainage from the left anterior mid tibia the left dorsal foot and to a lesser extent the left medial mid tibia. We left the compression wrap on all week for the amount of edema in his left leg is quite a bit better. We use silver alginate as the primary dressing 11/3; edema control is good. Left anterior lower leg left medial lower leg and the plantar first metatarsal head. The left anterior lower leg required debridement. Deep tissue culture I did of this wound showed MRSA I put him on 10 days of doxycycline which she will start today. We have him in compression wraps. He has a security card and [employer] however he is retiring on November 15. We will need to then get him into a better offloading boot for the left foot perhaps a total contact cast 11/10; edema control is quite good. Left anterior and left medial lower leg wounds in the setting of chronic venous insufficiency and lymphedema. He also has a substantial area over the left plantar first metatarsal head. 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 He has significant hypergranulation on the left plantar foot wound. The patient is a diabetic but he does not have significant PAD 11/17; edema control is quite good. Left anterior and left medial lower leg wounds look better. The really concerning area remains the area  on the left plantar first metatarsal head. He has a rim of epithelialization. He has been using a surgical shoe The patient is now retired from a a  [employer] I have gone over with him the need to offload this area aggressively. Starting today with a forefoot off loader but . possibly a total contact cast. 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 Arrives with a very significant odor from I think all of the wounds on the left leg and the left foot. Because of this I did not put a total contact cast on him today but will could still consider this. His wife was having cataract surgery which is the reason he missed the appointment 12/6. I saw this man 5 days ago with a swelling below the popliteal fossa. I thought he actually might have a Baker's cyst however the DVT rule out study that we could arrange right away was negative the technician told me this was not a ruptured Baker's cyst. We attempted to get this aspirated by under ultrasound guidance in interventional radiology however all they did was an ultrasound however it shows an extensive fluid collection 62 x 8 x 9.4 in the left thigh and left calf. The patient states he thinks this started 8 days ago or so but he really is not complaining of any pain, fever or systemic symptoms. He has not ha 12/20; after some difficulty I managed to get the patient into see Dr. Donzetta Matters. Eventually he was taken into the hospital and had a drain put in the fluid collection below his left knee posteriorly extending into the posterior thigh. He still has the drain in place. Culture of this showed moderate staff aureus few Morganella and few Klebsiella he is now on doxycycline and ciprofloxacin as suggested by infectious disease he is on this for a month. The drain will remain in place until it stops draining Electronic Signature(s) Signed: 06/13/2021 4:29:35 PM By: Linton Ham MD Entered By: Linton Ham on 06/13/2021  14:44:00 -------------------------------------------------------------------------------- Physical Exam Details Patient Name: Date of Service: [name] 06/13/2021 1:45 PM Medical Record Number: [id number] Patient Account Number: [id number] Date of Birth/Sex: Treating RN: 03-20-1951 (70 y.o. [name] Primary Care Provider: [name] Other Clinician: Referring Provider: Treating Provider/Extender: Stormy Card in Treatment: 8 Constitutional Patient is hypertensive.. Pulse regular and within target range for patient.Marland Kitchen Respirations regular, non-labored and within target range.. Temperature is normal and within the target range for the patient.Marland Kitchen Appears in no distress. Notes Wound exam; his wounds on the left anterior first metatarsal head and left lateral and left anterior thigh all look better. This may be related to the fact that he has not been Much. There Is Certainly Less Swelling in His Leg Peripheral Pulses Are Palpable The Drain Has Open Areas on the Left Lateral Calf and the Posterior Thigh Electronic Signature(s) Signed: 06/13/2021 4:29:35 PM By: Linton Ham MD Entered By: Linton Ham on 06/13/2021 14:45:56 -------------------------------------------------------------------------------- Physician Orders Details Patient Name: Date of Service: [name] 06/13/2021 1:45 PM Medical Record Number: [id number] Patient Account Number: [id number] Date of Birth/Sex: Treating RN: 1951-03-30 (70 y.o. [name] Primary Care Provider: [name] Other Clinician: Referring Provider: Treating Provider/Extender: Stormy Card in Treatment: 8 Verbal / Phone Orders: No Diagnosis Coding ICD-10 Coding Code Description [phone] Type  2 diabetes mellitus with foot ulcer 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 I89.0 Lymphedema, not elsewhere classified I87.322 Chronic  venous hypertension (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verity 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 of other part of left foot with 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 - Thursday 12/29 - Dr. Dellia Nims Return A Nurse Visit: - Thursday 12/22 and Tuesday 12/27 Bathing/ Shower/ Hygiene May shower with protection but do not get wound dressing(s) wet. - Use a cast protector so you can shower without getting your wrap(s) wet Edema Control - Lymphedema / SCD / Other Elevate legs to the level of the heart or above for 30 minutes daily and/or when sitting, a frequency of: - throughout the day Avoid standing for long periods of time. Patient to wear own compression stockings every day. - on right leg; Moisturize legs daily. - Ammonium LACTATE to BLE every day. Off-Loading Wedge shoe to: - left front foot offloader Wound Treatment Wound #18 - Metatarsal head first Wound Laterality: Plantar, Left Cleanser: Soap and Water 1 x Per Week/7 Days Discharge Instructions: May shower and wash wound with dial antibacterial soap and water prior to dressing change. Cleanser: Wound Cleanser 1 x Per Week/7 Days Discharge Instructions: Cleanse the wound with wound cleanser prior to applying a clean dressing using gauze sponges, not tissue or cotton balls. Peri-Wound Care: Triamcinolone 15 (g) 1 x Per Week/7 Days Discharge Instructions: Use triamcinolone 15 (g) as directed Peri-Wound Care: Zinc Oxide Ointment 30g tube 1 x Per Week/7 Days Discharge Instructions: Apply Zinc Oxide to periwound with each dressing change Peri-Wound Care: Sween Lotion (Moisturizing lotion) 1 x Per Week/7 Days Discharge Instructions: Apply moisturizing lotion as directed Prim Dressing: Hydrofera Blue Classic Foam, 4x4 in 1 x Per Week/7  Days ary Discharge Instructions: Moisten with saline prior to applying to wound bed Secondary Dressing: ABD Pad, 5x9 1 x Per Week/7 Days Discharge Instructions: Apply over primary dressing as directed. Secondary Dressing: Zetuvit Plus 4x8 in 1 x Per Week/7 Days Discharge Instructions: Apply over primary dressing as directed. Secondary Dressing: CarboFLEX Odor Control Dressing, 4x4 in 1 x Per Week/7 Days Discharge Instructions: Apply over primary dressing as directed. Compression Wrap: Unnaboot w/Calamine, 4x10 (in/yd) 1 x Per Week/7 Days Discharge Instructions: Apply Unnaboot as directed. May also use Miliken CoFlex Calamine 2 layer compression system as alternative. Wound #19 - Lower Leg Wound Laterality: Left, Anterior Cleanser: Soap and Water 1 x Per Week/7 Days Discharge Instructions: May shower and wash wound with dial antibacterial soap and water prior to dressing change. Cleanser: Wound Cleanser 1 x Per Week/7 Days Discharge Instructions: Cleanse the wound with wound cleanser prior to applying a clean dressing using gauze sponges, not tissue or cotton balls. Peri-Wound Care: Triamcinolone 15 (g) 1 x Per Week/7 Days Discharge Instructions: Use triamcinolone 15 (g) as directed Peri-Wound Care: Zinc Oxide Ointment 30g tube 1 x Per Week/7 Days Discharge Instructions: Apply Zinc Oxide to periwound with each dressing change Peri-Wound Care: Sween Lotion (Moisturizing lotion) 1 x Per Week/7 Days Discharge Instructions: Apply moisturizing lotion as directed Prim Dressing: Hydrofera Blue Classic Foam, 4x4 in 1 x Per Week/7 Days ary Discharge Instructions: Moisten with saline prior to applying to wound bed Secondary Dressing: ABD Pad, 5x9 1 x Per Week/7 Days Discharge Instructions: Apply over primary dressing as directed. Secondary Dressing:  Zetuvit Plus 4x8 in 1 x Per Week/7 Days Discharge Instructions: Apply over primary dressing as directed. Compression Wrap: Unnaboot w/Calamine, 4x10  (in/yd) 1 x Per Week/7 Days Discharge Instructions: Apply Unnaboot as directed. May also use Miliken CoFlex Calamine 2 layer compression system as alternative. Wound #22 - Lower Leg Wound Laterality: Left, Lateral Cleanser: Soap and Water 1 x Per Week/7 Days Discharge Instructions: May shower and wash wound with dial antibacterial soap and water prior to dressing change. Cleanser: Wound Cleanser 1 x Per Week/7 Days Discharge Instructions: Cleanse the wound with wound cleanser prior to applying a clean dressing using gauze sponges, not tissue or cotton balls. Peri-Wound Care: Triamcinolone 15 (g) 1 x Per Week/7 Days Discharge Instructions: Use triamcinolone 15 (g) as directed Peri-Wound Care: Zinc Oxide Ointment 30g tube 1 x Per Week/7 Days Discharge Instructions: Apply Zinc Oxide to periwound with each dressing change Peri-Wound Care: Sween Lotion (Moisturizing lotion) 1 x Per Week/7 Days Discharge Instructions: Apply moisturizing lotion as directed Prim Dressing: Hydrofera Blue Classic Foam, 4x4 in 1 x Per Week/7 Days ary Discharge Instructions: Moisten with saline prior to applying to wound bed Secondary Dressing: ABD Pad, 5x9 1 x Per Week/7 Days Discharge Instructions: Apply over primary dressing as directed. Secondary Dressing: Zetuvit Plus 4x8 in 1 x Per Week/7 Days Discharge Instructions: Apply over primary dressing as directed. Compression Wrap: Unnaboot w/Calamine, 4x10 (in/yd) 1 x Per Week/7 Days Discharge Instructions: Apply Unnaboot as directed. May also use Miliken CoFlex Calamine 2 layer compression system as alternative. Electronic Signature(s) Signed: 06/13/2021 4:29:35 PM By: Linton Ham MD Signed: 06/13/2021 5:35:33 PM By: Levan Hurst RN, BSN Entered By: Levan Hurst on 06/13/2021 14:32:22 -------------------------------------------------------------------------------- Problem List Details Patient Name: Date of Service: [name] 06/13/2021 1:45  PM Medical Record Number: [id number] Patient Account Number: [id number] Date of Birth/Sex: Treating RN: 11/23/1950 (70 y.o. [name] Primary Care Provider: [name] Other Clinician: Referring Provider: Treating Provider/Extender: Stormy Card in Treatment: 8 Active Problems ICD-10 Encounter Code Description Active Date MDM Diagnosis E11.621 Type 2 diabetes mellitus with foot ulcer 04/12/2021 No Yes 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 04/12/2021 No Yes I89.0 Lymphedema, not elsewhere classified 04/12/2021 No Yes I87.322 Chronic venous hypertension (idiopathic) with inflammation of left lower 04/12/2021 No Yes extremity 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 ulcer of other part of left lower leg with other specified 04/12/2021 No Yes severity 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 of other part of left foot with other specified 04/12/2021 No Yes severity E11.42 Type 2 diabetes mellitus with diabetic polyneuropathy 04/12/2021 No Yes L02.416 Cutaneous abscess of left lower limb 06/13/2021 No Yes Inactive Problems Resolved Problems Electronic Signature(s) Signed: 06/13/2021 4:29:35 PM By: Linton Ham MD Entered By: Linton Ham on 06/13/2021 14:40:15 -------------------------------------------------------------------------------- Progress Note Details Patient Name: Date of Service: [name] 06/13/2021 1:45 PM Medical Record Number: [id number] Patient Account Number: [id number] Date of Birth/Sex: Treating RN: 01/08/1951 (70 y.o. [name] Primary Care Provider: [name] Other Clinician: Referring Provider: Treating Provider/Extender: Stormy Card in Treatment: 8 Subjective History of Present Illness (HPI) 10/11/17; Mr. Dibello is a 70 year old man who tells me that in 2015 he slipped down the latter traumatizing his left leg. He developed a wound in the same  spot the area that we are currently looking at. He states this closed over for the most part although he always felt it was somewhat unstable. In 2016 he hit the same area  with the door of his car had this reopened. He tells me that this is never really closed although sometimes an inflow it remains open on a constant basis. He has not been using any specific dressing to this except for topical antibiotics the nature of which were not really sure. His primary doctor did send him to see Dr. [name] of interventional cardiology. He underwent an angiogram on 08/06/17 and he underwent a PTA and directional atherectomy of the lesser distal SFA and popliteal arteries which resulted in brisk improvement in blood flow. It was noted that he had 2 vessel runoff through the anterior tibial and peroneal. He is also been to see vascular and interventional radiologist. He was not felt to have any significant superficial venous insufficiency. Presumably is not a candidate for any ablation. It was suggested he come here for wound care. The patient is a type II diabetic on insulin. He also has a history of venous insufficiency. ABIs on the left were noncompressible in our clinic 10/21/17; patient we admitted to the clinic last week. 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 We put Iodosorb on him after an aggressive debridement and 3 layer compression. He complained of pain in his ankle and itching with is skin in fact he scratched the area on the medial calf superiorly at the rim of our wraps and he has 2 small open areas in that location today which are new. I changed his primary dressing today to silver collagen. As noted he is already had revascularization and does not have any significant superficial venous insufficiency that would be amenable to ablation 10/28/17; patient admitted to the clinic 2 weeks ago. He has a smaller Wound. Scratch injury from  last week revealed. There is large wound over the tibial area. This is smaller. Granulation looks healthy. No need for debridement. 11/04/17; the wound on the left lateral calf looks better. Improved dimensions. Surface of this looks better. We've been maintaining him and Kerlix Coban wraps. He finds this much more comfortable. Silver collagen dressing 11/11/17; left lateral Wound continues to look healthy be making progress. Using a #5 curet I removed removed nonviable skin from the surface of the wound and then necrotic debris from the wound surface. Surface of the wound continues to look healthy. ooHe also has an open area on the left great toenail bed. We've been using topical antibiotics. 11/19/17; left anterior lateral wound continues to look healthy but it's not closed. ooHe also had a small wound above this on the left leg ooInitially traumatic wounds in the setting of significant chronic venous insufficiency and stasis dermatitis 11/25/17; left anterior wounds superiorly is closed still a small wound inferiorly. 12/02/17; left anterior tibial area. Arrives today with adherent callus. Post debridement clearly not completely closed. Hydrofera Blue under 3 layer compression. 12/09/17; left anterior tibia. Circumferential eschar however the wound bed looks stable to improved. We've been using Hydrofera Blue under 3 layer compression 12/17/17; left anterior tibia. Apparently this was felt to be closed however when the wrap was taken off there is a skin tear to reopen wounds in the same area we've been using Hydrofera Blue under 3 layer compression 12/23/17 left anterior tibia. Not close to close this week apparently the [facility] was stuck to this again. Still circumferential eschar requiring debridement. I put a contact layer on this this time under the Hydrofera Blue 12/31/17; left anterior tibia. Wound is better slight  amount of hyper-granulation. Using Hydrofera Blue over Adaptic. 01/07/18;  left anterior tibia. The wound had some surface eschar however after this was removed he has no open wound.he was already revascularized by Dr. [name] when he came to our clinic with atherectomy of the left SFA and popliteal artery. He was also sent to interventional radiology for venous reflux studies. He was not felt to have significant reflux but certainly has chronic venous changes of his skin with hemosiderin deposition around this area. He will definitely need to lubricate his skin and wear compression stocking and I've talked to him about this. READMISSION 05/26/2018 This is a now 70 year old man we cared for with traumatic wounds on his left anterior lower extremity. He had been previously revascularized during that admission by Dr. [name]. Apparently in follow-up Dr. [name] noted that he had deterioration in his arterial status. He underwent a stent placement in the distal left SFA on 04/22/2018. 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 He went back to the angiography suite on 04/30/2018 he underwent PTA and balloon angioplasty of the occluded left mid anterior tibial artery, thrombotic occlusion went from 100 to 0% which reconstitutes the posterior tibial artery. He had thrombectomy and aspiration of the peroneal artery. The stent placed in the distal SFA left SFA was still occluded. 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 He has been recuperating at [facility] after the most recent hospitalization. He is going home tomorrow. He tells me that 2 weeks ago he traumatized the tip of his left fifth toe. He came in urgently for our review of this. This was a history of before I noted that Dr. [name] had already  noted dry gangrenous changes of the left fifth toe 06/09/2018; 2-week follow-up. I did contact Dr. [name] after his last appointment and he apparently saw 1 of Dr. Irven Shelling colleagues the next day. He does not follow-up with Dr. [name] himself until Thursday of this week. 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 Nevertheless there is no evidence of infection no drainage and no pain. He had a new area that this week when we were signing him in today on the left anterior mid tibia area, this is in close proximity to the previous wound we have dealt with in this clinic. 06/23/2018; 2-week follow-up. I did not receive a recent note from Dr. [name] to review today. Our office is trying to obtain this. He is apparently not planning to do further vascular interventions and wondered about compression to try and help with the patient's chronic venous insufficiency. However we are also concerned about the arterial flow. ooHe arrives in clinic today with a new area on the left third toe. The areas on the calf/anterior tibia are close to closing. The left fifth toe is still mummified using Betadine. -In reviewing things with the patient he has what sounds like claudication with mild to moderate amount of activity. 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 I prescribed Levaquin over the phone while we attempted to arrange a plan of care. However the patient called yesterday to report he had low-grade fever and he came in today acutely. There is  been a marked deterioration in the left third toe with spreading cellulitis up into the dorsal left foot. He was referred to the emergency room. Readmission: 06/29/2020 patient presents today for reevaluation here in our clinic he was previously treated by Dr. Dellia Nims at the latter part of 2019 in 2 the beginning of 2020. Subsequently we have not seen him since that time in the interim he did have evaluation with vein and  vascular specialist specifically Dr. Anice Paganini who did perform quite extensive work for a left femoral to anterior tibial artery bypass. With that being said in the interim the patient has developed significant lymphedema and has wounds that he tells me have really never healed in regard to the incision site on the left leg. He also has multiple wounds on the feet for various reasons some of which is that he tends to pick at his feet. Fortunately there is no signs of active infection systemically at this time he does have some wounds that are little bit deeper but most are fairly superficial he seems to have good blood flow and overall everything appears to be healthy I see no bone exposed and no obvious signs of osteomyelitis. I do not know that he necessarily needs a x-ray at this point although that something we could consider depending on how things progress. The patient does have a history of lymphedema, diabetes, this is type II, chronic kidney disease stage III, hypertension, and history of peripheral vascular disease. 07/05/2020; patient admitted last week. Is a patient I remember from 2019 he had a spreading infection involving the left foot and we sent him to the hospital. 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 He subsequently had a left femoral to anterior tibial bypass by Dr.Cain vein and vascular. He also has severe lymphedema with chronic skin changes related to that on the left leg. The most problematic area that was new today was on the left medial great toe. This was apparently a small area last week there was purulent drainage which our intake nurse cultured. Also areas on the left medial foot and heel left lateral foot. He has 2 areas on the left medial calf left lateral calf in the setting of the severe lymphedema. 07/13/2020 on evaluation today patient appears to be doing better in my opinion compared to his last visit. The good news is  there is no signs of active infection systemically and locally I do not see any signs of infection either. He did have an x-ray which was negative that is great news he had a culture which showed MRSA but at the same time he is been on the doxycycline which has helped. I do think we may want to extend this for 7 additional days 1/25; patient admitted to the clinic a few weeks ago. 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 We have been putting him under compression his edema control is a lot better but he is severe verricused skin on the left leg. He is really done quite well he still has an open area on the left medial calf and the left medial first metatarsal head. We have been using silver collagen on the leg silver alginate on the foot 07/27/2020 upon evaluation today patient appears to be doing decently well in regard to his wounds. He still has a lot of dry skin on the left leg. Some of this is starting to  peel back and I think he may be able to have them out by removing some that today. Fortunately there is no signs of active infection at this time on the left leg although on the right leg he does appear to have swelling and erythema as well as some mild warmth to touch. This does have been concerned about the possibility of cellulitis although within the differential diagnosis I do think that potentially a DVT has to be at least considered. We need to rule that out before proceeding would just call in the cellulitis. Especially since he is having pain in the posterior aspect of his calf muscle. 2/8; the patient had seen sparingly. He has severe skin changes of chronic lymphedema in the left leg thickened hyperkeratotic verrucous skin. He has an open wound on the medial part of the left first met head left mid tibia. He also has a rim of nonepithelialized skin in the anterior mid tibia. He brought in the AmLactin lotion that was been prescribed although  I am not sure under compression and its utility. There concern about cellulitis on the right lower leg the last time he was here. He was put on on antibiotics. His DVT rule out was negative. The right leg looks fine he is using his stocking on this area 08/10/2020 upon evaluation today patient appears to be doing well with regard to his leg currently. He has been tolerating the dressing changes without complication. Fortunately there is no signs of active infection which is great news. Overall very pleased with where things stand. 2/22; the patient still has an area on the medial part of the left first met his head. This looks better than when I last saw this earlier this month he has a rim of epithelialization but still some surface debris. Mostly everything on the left leg is healed. There is still a vulnerable in the left mid tibia area. 08/30/2020 upon evaluation today patient appears to be doing much better in regard to his wounds on his foot. 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 Nonetheless I think we will address that today I Minna send in a prescription for him in that regard. Overall though there does not appear to be any signs of significant worsening. 09/07/2020 on evaluation today patient's wounds over his left foot appear to be doing excellent. I do not see any signs of infection there is some callus buildup this can require debridement for certain but overall I feel like he is managing quite nicely. He still using the AmLactin cream which has been beneficial for him as well. 3/22; left foot wound is closed. There is no open area here. He is using ammonium lactate lotion to the lower extremities to help exfoliate dry cracked skin. He has compression stockings from elastic therapy in [place]. The wound on the medial part of his left first met head is  healed today. READMISSION 04/12/2021 Mr. Yankowski is a patient we know fairly well he had a prolonged stay in clinic in 2019 with wounds on his left lateral and left anterior lower extremity in the setting of chronic venous insufficiency. More recently he was here earlier this year with predominantly an area on his left foot first metatarsal head plantar and he says the plantar foot broke down on its not long after we discharged him but he did not come  back here. The last few months areas of broken down on his left anterior and again the left lateral lower extremity. The leg itself is very swollen chronically enlarged a lot of hyperkeratotic dry Berry Q skin in the left lower leg. His edema extends well into the thigh. He was seen by Dr. Donzetta Matters. He had ABIs on 03/02/2021 showing an ABI on the right of 1 with a TBI of 0.72 his ABI in the left at 1.09 TBI of 0.99. Monophasic and biphasic waveforms on the right. On the left monophasic waveforms were noted he went on to have an angiogram on 03/27/2021 this showed the aortic aortic and iliac segments were free of flow-limiting stenosis the left common femoral vein to evaluate the left femoral to anterior tibial artery bypass was unobstructed the bypass was patent without any areas of stenosis. We discharged the patient in bilateral juxta lite stockings but very clearly that was not sufficient to control the swelling and maintain skin integrity. He is clearly going to need compression pumps. 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 This is fortunate because he is on his feet for long periods of time. 10/27; patient comes in with our intake nurse reporting copious amount of green drainage from the left anterior mid tibia the left dorsal foot and to a lesser extent the left medial mid tibia. We left the compression wrap on all week for the amount of edema in his left leg is quite a bit better. We use silver  alginate as the primary dressing 11/3; edema control is good. Left anterior lower leg left medial lower leg and the plantar first metatarsal head. The left anterior lower leg required debridement. Deep tissue culture I did of this wound showed MRSA I put him on 10 days of doxycycline which she will start today. We have him in compression wraps. He has a security card and [employer] however he is retiring on November 15. We will need to then get him into a better offloading boot for the left foot perhaps a total contact cast 11/10; edema control is quite good. Left anterior and left medial lower leg wounds in the setting of chronic venous insufficiency and lymphedema. He also has a substantial area over the left plantar first metatarsal head. 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 He has significant hypergranulation on the left plantar foot wound. The patient is a diabetic but he does not have significant PAD 11/17; edema control is quite good. Left anterior and left medial lower leg wounds look better. The really concerning area remains the area on the left plantar first metatarsal head. He has a rim of epithelialization. 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 Starting today with a forefoot off loader but . possibly a total contact cast. 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 Arrives with a very significant odor from I think all of the wounds on the left leg and the left foot. Because of this I did not put a total contact cast on him today but will could still consider  this. His wife was having cataract surgery which is the reason he missed the appointment 12/6. I saw this man 5  days ago with a swelling below the popliteal fossa. I thought he actually might have a Baker's cyst however the DVT rule out study that we could arrange right away was negative the technician told me this was not a ruptured Baker's cyst. We attempted to get this aspirated by under ultrasound guidance in interventional radiology however all they did was an ultrasound however it shows an extensive fluid collection 62 x 8 x 9.4 in the left thigh and left calf. The patient states he thinks this started 8 days ago or so but he really is not complaining of any pain, fever or systemic symptoms. He has not ha 12/20; after some difficulty I managed to get the patient into see Dr. Donzetta Matters. Eventually he was taken into the hospital and had a drain put in the fluid collection below his left knee posteriorly extending into the posterior thigh. He still has the drain in place. Culture of this showed moderate staff aureus few Morganella and few Klebsiella he is now on doxycycline and ciprofloxacin as suggested by infectious disease he is on this for a month. The drain will remain in place until it stops draining Objective Constitutional Patient is hypertensive.. Pulse regular and within target range for patient.Marland Kitchen Respirations regular, non-labored and within target range.. Temperature is normal and within the target range for the patient.Marland Kitchen Appears in no distress. Vitals Time Taken: 1:50 PM, Height: 74 in, Weight: 238 lbs, BMI: 30.6, Temperature: 98.1 F, Pulse: 80 bpm, Respiratory Rate: 18 breaths/min, Blood Pressure: 148/81 mmHg, Capillary Blood Glucose: 245 mg/dl. General Notes: glucose per pt report General Notes: Wound exam; his wounds on the left anterior first metatarsal head and left lateral and left anterior thigh all look better. This may be related to the fact that he has not been Much. There Is Certainly Less Swelling in His Leg Peripheral Pulses Are Palpable The Drain Has Open Areas on the Left  Lateral Calf and the Posterior Thigh Integumentary (Hair, Skin) Wound #18 status is Open. Original cause of wound was Gradually Appeared. The date acquired was: 08/23/2020. The wound has been in treatment 8 weeks. The wound is located on the Left,Plantar Metatarsal head first. The wound measures 2.6cm length x 2.9cm width x 0.1cm depth; 5.922cm^2 area and 0.592cm^3 volume. There is Fat Layer (Subcutaneous Tissue) exposed. There is no tunneling or undermining noted. There is a medium amount of serosanguineous drainage noted. The wound margin is distinct with the outline attached to the wound base. There is large (67-100%) red, pink, hyper - granulation within the wound bed. There is a small (1-33%) amount of necrotic tissue within the wound bed including Adherent Slough. Wound #19 status is Open. Original cause of wound was Gradually Appeared. The date acquired was: 08/23/2020. The wound has been in treatment 8 weeks. The wound is located on the Left,Anterior Lower Leg. The wound measures 2.7cm length x 1.8cm width x 0.1cm depth; 3.817cm^2 area and 0.382cm^3 volume. There is Fat Layer (Subcutaneous Tissue) exposed. There is no tunneling or undermining noted. There is a medium amount of serosanguineous drainage noted. The wound margin is flat and intact. There is large (67-100%) red granulation within the wound bed. There is a small (1-33%) amount of necrotic tissue within the wound bed including Adherent Slough. Wound #21 status is Healed - Epithelialized. Original cause of wound was Gradually Appeared. The date acquired was:  08/23/2020. The wound has been in treatment 8 weeks. The wound is located on the Left,Medial Lower Leg. The wound measures 0cm length x 0cm width x 0cm depth; 0cm^2 area and 0cm^3 volume. There is no tunneling or undermining noted. There is a none present amount of drainage noted. There is no granulation within the wound bed. There is no necrotic tissue within the wound bed. Wound #22  status is Open. Original cause of wound was Bump. The date acquired was: 06/03/2021. The wound has been in treatment 1 weeks. The wound is located on the Left,Lateral Lower Leg. The wound measures 4cm length x 2.5cm width x 1.8cm depth; 7.854cm^2 area and 14.137cm^3 volume. There is Fat Layer (Subcutaneous Tissue) exposed. There is no tunneling or undermining noted. There is a large amount of sanguinous drainage noted. The wound margin is well defined and not attached to the wound base. There is large (67-100%) red granulation within the wound bed. There is a small (1-33%) amount of necrotic tissue within the wound bed including Adherent Slough. 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 There was a Haematologist Compression Therapy Procedure by Levan Hurst, RN. Post procedure Diagnosis Wound #18: Same as Pre-Procedure Wound #19 Pre-procedure diagnosis of Wound #19 is a Diabetic Wound/Ulcer of the Lower Extremity located on the Left,Anterior Lower Leg . There was a Haematologist Compression Therapy Procedure by Levan Hurst, RN. Post procedure Diagnosis Wound #19: Same as Pre-Procedure Wound #22 Pre-procedure diagnosis of Wound #22 is a Cyst located on the Left,Lateral  Lower Leg . There was a Haematologist Compression Therapy Procedure by Levan Hurst, RN. Post procedure Diagnosis Wound #22: Same as Pre-Procedure Plan Follow-up Appointments: Return Appointment in 1 week. - Thursday 12/29 - Dr. Dellia Nims Nurse Visit: - Thursday 12/22 and Tuesday 12/27 Bathing/ Shower/ Hygiene: May shower with protection but do not get wound dressing(s) wet. - Use a cast protector so you can shower without getting your wrap(s) wet Edema Control - Lymphedema / SCD / Ot1. I have continued with Hydrofera Blue under Unna boots. Sit to fit and ABDs over the draining areas Elevate legs to the level of the heart or above for 30 minutes daily and/or when sitting, a frequency of: - throughout the day Avoid standing for long periods of time. Patient to wear own compression stockings every day. - on right leg; Moisturize legs daily. - Ammonium LACTATE to BLE every day. Off-Loading: Wedge shoe to: - left front foot offloader WOUND #18: - Metatarsal head first Wound Laterality: Plantar, Left Cleanser: Soap and Water 1 x Per Week/7 Days Discharge Instructions: May shower and wash wound with dial antibacterial soap and water prior to dressing change. Cleanser: Wound Cleanser 1 x Per Week/7 Days Discharge Instructions: Cleanse the wound with wound cleanser prior to applying a clean dressing using gauze sponges, not tissue or cotton balls. Peri-Wound Care: Triamcinolone 15 (g)  1 x Per Week/7 Days Discharge Instructions: Use triamcinolone 15 (g) as directed Peri-Wound Care: Zinc Oxide Ointment 30g tube 1 x Per Week/7 Days Discharge Instructions: Apply Zinc Oxide to periwound with each dressing change Peri-Wound Care: Sween Lotion (Moisturizing lotion) 1 x Per Week/7 Days Discharge Instructions: Apply moisturizing lotion as directed Prim Dressing: Hydrofera Blue Classic Foam, 4x4 in 1 x Per Week/7 Days ary Discharge Instructions: Moisten with saline prior to applying to wound bed Secondary  Dressing: ABD Pad, 5x9 1 x Per Week/7 Days Discharge Instructions: Apply over primary dressing as directed. Secondary Dressing: Zetuvit Plus 4x8 in 1 x Per Week/7 Days Discharge Instructions: Apply over primary dressing as directed. Secondary Dressing: CarboFLEX Odor Control Dressing, 4x4 in 1 x Per Week/7 Days Discharge Instructions: Apply over primary dressing as directed. Com pression Wrap: Unnaboot w/Calamine, 4x10 (in/yd) 1 x Per Week/7 Days Discharge Instructions: Apply Unnaboot as directed. May also use Miliken CoFlex Calamine 2 layer compression system as alternative. WOUND #19: - Lower Leg Wound Laterality: Left, Anterior Cleanser: Soap and Water 1 x Per Week/7 Days Discharge Instructions: May shower and wash wound with dial antibacterial soap and water prior to dressing change. Cleanser: Wound Cleanser 1 x Per Week/7 Days Discharge Instructions: Cleanse the wound with wound cleanser prior to applying a clean dressing using gauze sponges, not tissue or cotton balls. Peri-Wound Care: Triamcinolone 15 (g) 1 x Per Week/7 Days Discharge Instructions: Use triamcinolone 15 (g) as directed Peri-Wound Care: Zinc Oxide Ointment 30g tube 1 x Per Week/7 Days Discharge Instructions: Apply Zinc Oxide to periwound with each dressing change Peri-Wound Care: Sween Lotion (Moisturizing lotion) 1 x Per Week/7 Days Discharge Instructions: Apply moisturizing lotion as directed Prim Dressing: Hydrofera Blue Classic Foam, 4x4 in 1 x Per Week/7 Days ary Discharge Instructions: Moisten with saline prior to applying to wound bed Secondary Dressing: ABD Pad, 5x9 1 x Per Week/7 Days Discharge Instructions: Apply over primary dressing as directed. Secondary Dressing: Zetuvit Plus 4x8 in 1 x Per Week/7 Days Discharge Instructions: Apply over primary dressing as directed. Com pression Wrap: Unnaboot w/Calamine, 4x10 (in/yd) 1 x Per Week/7 Days Discharge Instructions: Apply Unnaboot as directed. May also use  Miliken CoFlex Calamine 2 layer compression system as alternative. WOUND #22: - Lower Leg Wound Laterality: Left, Lateral Cleanser: Soap and Water 1 x Per Week/7 Days Discharge Instructions: May shower and wash wound with dial antibacterial soap and water prior to dressing change. Cleanser: Wound Cleanser 1 x Per Week/7 Days Discharge Instructions: Cleanse the wound with wound cleanser prior to applying a clean dressing using gauze sponges, not tissue or cotton balls. Peri-Wound Care: Triamcinolone 15 (g) 1 x Per Week/7 Days Discharge Instructions: Use triamcinolone 15 (g) as directed Peri-Wound Care: Zinc Oxide Ointment 30g tube 1 x Per Week/7 Days Discharge Instructions: Apply Zinc Oxide to periwound with each dressing change Peri-Wound Care: Sween Lotion (Moisturizing lotion) 1 x Per Week/7 Days Discharge Instructions: Apply moisturizing lotion as directed Prim Dressing: Hydrofera Blue Classic Foam, 4x4 in 1 x Per Week/7 Days ary Discharge Instructions: Moisten with saline prior to applying to wound bed Secondary Dressing: ABD Pad, 5x9 1 x Per Week/7 Days Discharge Instructions: Apply over primary dressing as directed. Secondary Dressing: Zetuvit Plus 4x8 in 1 x Per Week/7 Days Discharge Instructions: Apply over primary dressing as directed. Com pression Wrap: Unnaboot w/Calamine, 4x10 (in/yd) 1 x Per Week/7 Days Discharge Instructions: Apply Unnaboot as directed. May also use Miliken CoFlex Calamine 2 layer compression system as  alternative. I have continued with Hydrofera Blue under Unna boots. Zetuvit/abds/unna Electronic Signature(s) Signed: 06/13/2021 4:29:35 PM By: Linton Ham MD Entered By: Linton Ham on 06/13/2021 14:47:55 -------------------------------------------------------------------------------- SuperBill Details Patient Name: Date of Service: [name] 06/13/2021 Medical Record Number: [id number] Patient Account Number: [id number] Date of  Birth/Sex: Treating RN: December 23, 1950 (70 y.o. [name] Primary Care Provider: [name] Other Clinician: Referring Provider: Treating Provider/Extender: Stormy Card in Treatment: 8 Diagnosis Coding ICD-10 Codes Code Description [phone] Type 2 diabetes mellitus with foot ulcer 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 I89.0 Lymphedema, not elsewhere classified I87.322 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verity 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 of other part of left foot with other specified severity E11.42 Type 2 diabetes mellitus with diabetic polyneuropathy L02.416 Cutaneous abscess of left lower limb Facility Procedures CPT4 Code: [id number] Description: (Facility Use Only) 29580LT - APPLY UNNA BOOT LT Modifier: Quantity: 1 Physician Procedures : CPT4 Code Description Modifier [id number] - WC PHYS LEVEL 3 - EST PT ICD-10 Diagnosis Description L02.416 Cutaneous abscess of left lower limb 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verity L97.528  Non-pressure chronic ulcer of other part of left foot with other specified severity Quantity: 1 Electronic Signature(s) Signed: 06/13/2021 5:35:33 PM By: Levan Hurst RN, BSN Signed: 06/14/2021 4:11:41 PM By: Linton Ham MD Previous Signature: 06/13/2021 4:29:35 PM Version By: Linton Ham MD Entered By: Levan Hurst on 06/13/2021 17:05:35

## 2021-06-15 ENCOUNTER — Other Ambulatory Visit: Payer: Self-pay

## 2021-06-15 ENCOUNTER — Encounter (HOSPITAL_BASED_OUTPATIENT_CLINIC_OR_DEPARTMENT_OTHER): Payer: BC Managed Care – PPO | Admitting: Internal Medicine

## 2021-06-15 DIAGNOSIS — E11621 Type 2 diabetes mellitus with foot ulcer: Secondary | ICD-10-CM | POA: Diagnosis not present

## 2021-06-15 NOTE — Progress Notes (Signed)
[name], [name] ([id number]) Visit Report for 06/15/2021 SuperBill Details Patient Name: Date of Service: [name], [name] 06/15/2021 Medical Record Number: [id number] Patient Account Number: [id number] Date of Birth/Sex: Treating RN: 01-30-1951 (70 y.o. [name] Primary Care Provider: [name] Other Clinician: Referring Provider: Treating Provider/Extender: Stormy Card in Treatment: 9 Diagnosis Coding ICD-10 Codes Code Description E11.621 Type 2 diabetes mellitus with foot ulcer 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 I89.0 Lymphedema, not elsewhere classified I87.322 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verity 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 of other part of left foot with other specified severity E11.42 Type 2 diabetes mellitus with diabetic polyneuropathy L02.416 Cutaneous abscess of left lower limb Facility Procedures CPT4 Code Description Modifier Quantity [id number] (Facility Use Only) 29580LT - Dorise Bullion BOOT LT 1 Electronic Signature(s) Signed: 06/15/2021 3:43:54 PM By: Linton Ham MD Signed: 06/15/2021 4:56:50 PM By: Baruch Gouty RN, BSN Entered By: Baruch Gouty on 06/15/2021 11:56:30

## 2021-06-15 NOTE — Progress Notes (Signed)
ELLIAS, MCELREATH ([id number]) Visit Report for 06/15/2021 Arrival Information Details Patient Name: Date of Service: MYCAH, FORMICA 06/15/2021 10:30 A M Medical Record Number: [id number] Patient Account Number: [id number] Date of Birth/Sex: Treating RN: [birth year] (70 y.o. [name], [name] Primary Care Mikaila Grunert: [name] Other Clinician: Referring Elda Dunkerson: Treating Saahas Hidrogo/Extender: Stormy Card in Treatment: 9 Visit Information History Since Last Visit Added or deleted any medications: No Patient Arrived: Ambulatory Any new allergies or adverse reactions: No Arrival Time: 11:26 Had a fall or experienced change in No Accompanied By: spouse activities of daily living that may affect Transfer Assistance: None risk of falls: Patient Identification Verified: Yes Signs or symptoms of abuse/neglect since last visito No Secondary Verification Process Completed: Yes Hospitalized since last visit: No Patient Requires Transmission-Based Precautions: No Implantable device outside of the clinic excluding No Patient Has Alerts: Yes cellular tissue based products placed in the center Patient Alerts: ABI's: 09/22 L:1.09 R: 1. since last visit: TBI's: R:0.72 L:0.99 Has Dressing in Place as Prescribed: Yes Has Compression in Place as Prescribed: Yes Pain Present Now: No Electronic Signature(s) Signed: 06/15/2021 4:56:50 PM By: Baruch Gouty RN, BSN Entered By: Baruch Gouty on 06/15/2021 11:27:32 -------------------------------------------------------------------------------- Compression Therapy Details Patient Name: Date of Service: [name] 06/15/2021 10:30 A M Medical Record Number: [id number] Patient Account Number: [id number] Date of Birth/Sex: Treating RN: 09-Jul-1950 (70 y.o. [name] Primary Care Brynne Doane: [name] Other Clinician: Referring Treylin Burtch: Treating Kahil Agner/Extender: Stormy Card in  Treatment: 9 Compression Therapy Performed for Wound Assessment: Wound #19 Left,Anterior Lower Leg Performed By: Clinician Baruch Gouty, RN Compression Type: Rolena Infante Electronic Signature(s) Signed: 06/15/2021 4:56:50 PM By: Baruch Gouty RN, BSN Entered By: Baruch Gouty on 06/15/2021 11:54:43 -------------------------------------------------------------------------------- Encounter Discharge Information Details Patient Name: Date of Service: [name] 06/15/2021 10:30 A M Medical Record Number: [id number] Patient Account Number: [id number] Date of Birth/Sex: Treating RN: [birth year] (70 y.o. [name] Primary Care Haris Baack: [name] Other Clinician: Referring Raynah Gomes: Treating Libia Fazzini/Extender: Stormy Card in Treatment: 9 Encounter Discharge Information Items Discharge Condition: Stable Ambulatory Status: Ambulatory Discharge Destination: Home Transportation: Private Auto Accompanied By: spouse Schedule Follow-up Appointment: Yes Clinical Summary of Care: Patient Declined Electronic Signature(s) Signed: 06/15/2021 4:56:50 PM By: Baruch Gouty RN, BSN Entered By: Baruch Gouty on 06/15/2021 11:56:15 -------------------------------------------------------------------------------- Patient/Caregiver Education Details Patient Name: Date of Service: [name] 12/22/2022andnbsp10:30 A M Medical Record Number: [id number] Patient Account Number: [id number] Date of Birth/Gender: Treating RN: Nov 18, 1950 (70 y.o. [name] Primary Care Physician: [name] Other Clinician: Referring Physician: Treating Physician/Extender: Stormy Card in Treatment: 9 Education Assessment Education Provided To: Patient Education Topics Provided Infection: Methods: Explain/Verbal Responses: Reinforcements needed, State content correctly Venous: Methods: Explain/Verbal Responses: Reinforcements  needed, State content correctly Wound/Skin Impairment: Methods: Explain/Verbal Responses: Reinforcements needed, State content correctly Electronic Signature(s) Signed: 06/15/2021 4:56:50 PM By: Baruch Gouty RN, BSN Entered By: Baruch Gouty on 06/15/2021 11:55:53 -------------------------------------------------------------------------------- Wound Assessment Details Patient Name: Date of Service: [name] 06/15/2021 10:30 A M Medical Record Number: [id number] Patient Account Number: [id number] Date of Birth/Sex: Treating RN: [birth year] (70 y.o. [name] Primary Care Nasir Bright: [name] Other Clinician: Referring Kason Benak: Treating Aalina Brege/Extender: Stormy Card in Treatment: 9 Wound Status Wound Number: 18 Primary Diabetic Wound/Ulcer of the Lower Extremity Etiology: Wound Location: Left, Plantar Metatarsal head first Wound Open Wounding Event: Gradually Appeared Status: Date Acquired:  08/23/2020 Comorbid Glaucoma, Sleep Apnea, Hypertension, Peripheral Arterial Disease, Weeks Of Treatment: 9 History: Peripheral Venous Disease, Type II Diabetes, Gout, Osteoarthritis, Clustered Wound: No Neuropathy Wound Measurements Length: (cm) 2.6 Width: (cm) 2.9 Depth: (cm) 0.1 Area: (cm) 5.922 Volume: (cm) 0.592 % Reduction in Area: 58.1% % Reduction in Volume: 79.1% Epithelialization: Small (1-33%) Tunneling: No Undermining: No Wound Description Classification: Grade 2 Wound Margin: Distinct, outline attached Exudate Amount: Medium Exudate Type: Serosanguineous Exudate Color: red, brown Foul Odor After Cleansing: No Slough/Fibrino No Wound Bed Granulation Amount: Large (67-100%) Exposed Structure Granulation Quality: Red, Pink Fascia Exposed: No Necrotic Amount: Small (1-33%) Fat Layer (Subcutaneous Tissue) Exposed: Yes Necrotic Quality: Adherent Slough Tendon Exposed: No Muscle Exposed: No Joint Exposed: No Bone  Exposed: No Treatment Notes Wound #18 (Metatarsal head first) Wound Laterality: Plantar, Left Cleanser Soap and Water Discharge Instruction: May shower and wash wound with dial antibacterial soap and water prior to dressing change. Wound Cleanser Discharge Instruction: Cleanse the wound with wound cleanser prior to applying a clean dressing using gauze sponges, not tissue or cotton balls. Peri-Wound Care Triamcinolone 15 (g) Discharge Instruction: Use triamcinolone 15 (g) as directed Zinc Oxide Ointment 30g tube Discharge Instruction: Apply Zinc Oxide to periwound with each dressing change Sween Lotion (Moisturizing lotion) Discharge Instruction: Apply moisturizing lotion as directed Topical Primary Dressing Hydrofera Blue Classic Foam, 4x4 in Discharge Instruction: Moisten with saline prior to applying to wound bed Secondary Dressing ABD Pad, 5x9 Discharge Instruction: Apply over primary dressing as directed. Zetuvit Plus 4x8 in Discharge Instruction: Apply over primary dressing as directed. Secured With Compression Wrap Unnaboot w/Calamine, 4x10 (in/yd) Discharge Instruction: Apply Unnaboot as directed. May also use Miliken CoFlex Calamine 2 layer compression system as alternative. Compression Stockings Add-Ons Electronic Signature(s) Signed: 06/15/2021 4:56:50 PM By: Baruch Gouty RN, BSN Entered By: Baruch Gouty on 06/15/2021 11:52:40 -------------------------------------------------------------------------------- Wound Assessment Details Patient Name: Date of Service: [name] 06/15/2021 10:30 A M Medical Record Number: [id number] Patient Account Number: [id number] Date of Birth/Sex: Treating RN: 04/30/1951 (70 y.o. [name] Primary Care Falicia Lizotte: [name] Other Clinician: Referring Byrne Capek: Treating Akeen Ledyard/Extender: Stormy Card in Treatment: 9 Wound Status Wound Number: 19 Primary Diabetic Wound/Ulcer of the  Lower Extremity Etiology: Wound Location: Left, Anterior Lower Leg Wound Open Wounding Event: Gradually Appeared Status: Date Acquired: 08/23/2020 Comorbid Glaucoma, Sleep Apnea, Hypertension, Peripheral Arterial Disease, Weeks Of Treatment: 9 History: Peripheral Venous Disease, Type II Diabetes, Gout, Osteoarthritis, Clustered Wound: No Neuropathy Wound Measurements Length: (cm) 2.7 Width: (cm) 1.8 Depth: (cm) 0.1 Area: (cm) 3.817 Volume: (cm) 0.382 % Reduction in Area: 73.6% % Reduction in Volume: 91.2% Epithelialization: Small (1-33%) Tunneling: No Undermining: No Wound Description Classification: Grade 2 Wound Margin: Flat and Intact Exudate Amount: Medium Exudate Type: Serosanguineous Exudate Color: red, brown Foul Odor After Cleansing: No Slough/Fibrino No Wound Bed Granulation Amount: Large (67-100%) Exposed Structure Granulation Quality: Red Fascia Exposed: No Necrotic Amount: Small (1-33%) Fat Layer (Subcutaneous Tissue) Exposed: Yes Necrotic Quality: Adherent Slough Tendon Exposed: No Muscle Exposed: No Joint Exposed: No Bone Exposed: No Treatment Notes Wound #19 (Lower Leg) Wound Laterality: Left, Anterior Cleanser Soap and Water Discharge Instruction: May shower and wash wound with dial antibacterial soap and water prior to dressing change. Wound Cleanser Discharge Instruction: Cleanse the wound with wound cleanser prior to applying a clean dressing using gauze sponges, not tissue or cotton balls. Peri-Wound Care Triamcinolone 15 (g) Discharge Instruction: Use triamcinolone 15 (g) as directed Zinc Oxide Ointment 30g  tube Discharge Instruction: Apply Zinc Oxide to periwound with each dressing change Sween Lotion (Moisturizing lotion) Discharge Instruction: Apply moisturizing lotion as directed Topical Primary Dressing Hydrofera Blue Classic Foam, 4x4 in Discharge Instruction: Moisten with saline prior to applying to wound bed Secondary  Dressing ABD Pad, 5x9 Discharge Instruction: Apply over primary dressing as directed. Zetuvit Plus 4x8 in Discharge Instruction: Apply over primary dressing as directed. Secured With Compression Wrap Unnaboot w/Calamine, 4x10 (in/yd) Discharge Instruction: Apply Unnaboot as directed. May also use Miliken CoFlex Calamine 2 layer compression system as alternative. Compression Stockings Add-Ons Electronic Signature(s) Signed: 06/15/2021 4:56:50 PM By: Baruch Gouty RN, BSN Entered By: Baruch Gouty on 06/15/2021 11:53:03 -------------------------------------------------------------------------------- Wound Assessment Details Patient Name: Date of Service: [name] 06/15/2021 10:30 A M Medical Record Number: [id number] Patient Account Number: [id number] Date of Birth/Sex: Treating RN: 1950-06-30 (70 y.o. [name], [name] Primary Care Gavinn Collard: [name] Other Clinician: Referring Loretto Belinsky: Treating Davaris Youtsey/Extender: Stormy Card in Treatment: 9 Wound Status Wound Number: 22 Primary Cyst Etiology: Wound Location: Left, Lateral Lower Leg Wound Open Wounding Event: Bump Status: Date Acquired: 06/03/2021 Comorbid Glaucoma, Sleep Apnea, Hypertension, Peripheral Arterial Disease, Weeks Of Treatment: 1 History: Peripheral Venous Disease, Type II Diabetes, Gout, Osteoarthritis, Clustered Wound: No Neuropathy Wound Measurements Length: (cm) 4 Width: (cm) 2.5 Depth: (cm) 1.8 Area: (cm) 7.854 Volume: (cm) 14.137 % Reduction in Area: -376.3% % Reduction in Volume: -971.8% Epithelialization: None Tunneling: No Undermining: No Wound Description Classification: Full Thickness Without Exposed Support Structures Wound Margin: Well defined, not attached Exudate Amount: Large Exudate Type: Sanguinous Exudate Color: red Foul Odor After Cleansing: No Slough/Fibrino No Wound Bed Granulation Amount: Large (67-100%) Exposed  Structure Granulation Quality: Red Fascia Exposed: No Necrotic Amount: Small (1-33%) Fat Layer (Subcutaneous Tissue) Exposed: Yes Necrotic Quality: Adherent Slough Tendon Exposed: No Muscle Exposed: No Joint Exposed: No Bone Exposed: No Assessment Notes penrose drain present in wound Treatment Notes Wound #22 (Lower Leg) Wound Laterality: Left, Lateral Cleanser Soap and Water Discharge Instruction: May shower and wash wound with dial antibacterial soap and water prior to dressing change. Wound Cleanser Discharge Instruction: Cleanse the wound with wound cleanser prior to applying a clean dressing using gauze sponges, not tissue or cotton balls. Peri-Wound Care Triamcinolone 15 (g) Discharge Instruction: Use triamcinolone 15 (g) as directed Zinc Oxide Ointment 30g tube Discharge Instruction: Apply Zinc Oxide to periwound with each dressing change Sween Lotion (Moisturizing lotion) Discharge Instruction: Apply moisturizing lotion as directed Topical Primary Dressing Hydrofera Blue Classic Foam, 4x4 in Discharge Instruction: Moisten with saline prior to applying to wound bed Secondary Dressing ABD Pad, 5x9 Discharge Instruction: Apply over primary dressing as directed. Zetuvit Plus 4x8 in Discharge Instruction: Apply over primary dressing as directed. Secured With Compression Wrap Unnaboot w/Calamine, 4x10 (in/yd) Discharge Instruction: Apply Unnaboot as directed. May also use Miliken CoFlex Calamine 2 layer compression system as alternative. Compression Stockings Add-Ons Electronic Signature(s) Signed: 06/15/2021 4:56:50 PM By: Baruch Gouty RN, BSN Entered By: Baruch Gouty on 06/15/2021 11:54:16 -------------------------------------------------------------------------------- Vitals Details Patient Name: Date of Service: [name] 06/15/2021 10:30 A M Medical Record Number: [id number] Patient Account Number: [id number] Date of Birth/Sex: Treating  RN: 1950/10/26 (70 y.o. [name] Primary Care Taunja Brickner: [name] Other Clinician: Referring Tanga Gloor: Treating Deborha Moseley/Extender: Stormy Card in Treatment: 9 Vital Signs Time Taken: 11:27 Temperature (F): 98.3 Height (in): 74 Pulse (bpm): 79 Weight (lbs): 238 Respiratory Rate (breaths/min): 20 Body Mass Index (BMI): 30.6 Blood  Pressure (mmHg): 120/77 Capillary Blood Glucose (mg/dl): 158 Reference Range: 80 - 120 mg / dl Notes glucose per pt report at present Electronic Signature(s) Signed: 06/15/2021 4:56:50 PM By: Baruch Gouty RN, BSN Entered By: Baruch Gouty on 06/15/2021 11:28:50

## 2021-06-20 ENCOUNTER — Encounter (HOSPITAL_BASED_OUTPATIENT_CLINIC_OR_DEPARTMENT_OTHER): Payer: BC Managed Care – PPO | Admitting: Internal Medicine

## 2021-06-20 ENCOUNTER — Other Ambulatory Visit: Payer: Self-pay

## 2021-06-20 DIAGNOSIS — E11621 Type 2 diabetes mellitus with foot ulcer: Secondary | ICD-10-CM | POA: Diagnosis not present

## 2021-06-20 NOTE — Progress Notes (Signed)
AXIEL, FJELD ([id number]) Visit Report for 06/20/2021 Arrival Information Details Patient Name: Date of Service: RYSON, BACHA 06/20/2021 9:30 A M Medical Record Number: [id number] Patient Account Number: [id number] Date of Birth/Sex: Treating RN: Aug 17, 1950 (70 y.o. [name], [name] Primary Care Jedrick Hutcherson: [name] Other Clinician: Referring Safire Gordin: Treating Cyla Haluska/Extender: Cornelious Bryant in Treatment: 9 Visit Information History Since Last Visit Added or deleted any medications: No Patient Arrived: Ambulatory Any new allergies or adverse reactions: No Arrival Time: 09:44 Had a fall or experienced change in No Accompanied By: self activities of daily living that may affect Transfer Assistance: None risk of falls: Patient Identification Verified: Yes Signs or symptoms of abuse/neglect since last visito No Secondary Verification Process Completed: Yes Hospitalized since last visit: No Patient Requires Transmission-Based Precautions: No Implantable device outside of the clinic excluding No Patient Has Alerts: Yes cellular tissue based products placed in the center Patient Alerts: ABI's: 09/22 L:1.09 R: 1. since last visit: TBI's: R:0.72 L:0.99 Has Dressing in Place as Prescribed: Yes Pain Present Now: No Electronic Signature(s) Signed: 06/20/2021 1:16:02 PM By: Rhae Hammock RN Entered By: Rhae Hammock on 06/20/2021 09:45:50 -------------------------------------------------------------------------------- Compression Therapy Details Patient Name: Date of Service: [name] 06/20/2021 9:30 A M Medical Record Number: [id number] Patient Account Number: [id number] Date of Birth/Sex: Treating RN: 11-24-1950 (70 y.o. [name], [name] Primary Care Elizah Lydon: [name] Other Clinician: Referring Airika Alkhatib: Treating Emilia Kayes/Extender: Cornelious Bryant in Treatment: 9 Compression Therapy Performed for  Wound Assessment: Wound #18 Left,Plantar Metatarsal head first Performed By: Clinician Rhae Hammock, RN Compression Type: Rolena Infante Electronic Signature(s) Signed: 06/20/2021 1:16:02 PM By: Rhae Hammock RN Entered By: Rhae Hammock on 06/20/2021 09:46:55 -------------------------------------------------------------------------------- Compression Therapy Details Patient Name: Date of Service: [name] 06/20/2021 9:30 A M Medical Record Number: [id number] Patient Account Number: [id number] Date of Birth/Sex: Treating RN: Aug 30, 1950 (70 y.o. [name] Primary Care Damyon Mullane: Other Clinician: [name] Referring Breiona Couvillon: Treating Kenna Kirn/Extender: Cornelious Bryant in Treatment: 9 Compression Therapy Performed for Wound Assessment: Wound #19 Left,Anterior Lower Leg Performed By: Clinician Rhae Hammock, RN Compression Type: Rolena Infante Electronic Signature(s) Signed: 06/20/2021 1:16:02 PM By: Rhae Hammock RN Entered By: Rhae Hammock on 06/20/2021 09:46:55 -------------------------------------------------------------------------------- Compression Therapy Details Patient Name: Date of Service: [name] 06/20/2021 9:30 A M Medical Record Number: [id number] Patient Account Number: [id number] Date of Birth/Sex: Treating RN: 08/14/1950 (70 y.o. [name], [name] Primary Care Hanin Decook: [name] Other Clinician: Referring Maddison Kilner: Treating Evetta Renner/Extender: Cornelious Bryant in Treatment: 9 Compression Therapy Performed for Wound Assessment: Wound #22 Left,Lateral Lower Leg Performed By: Clinician Rhae Hammock, RN Compression Type: Rolena Infante Electronic Signature(s) Signed: 06/20/2021 1:16:02 PM By: Rhae Hammock RN Entered By: Rhae Hammock on 06/20/2021 09:46:56 -------------------------------------------------------------------------------- Encounter Discharge  Information Details Patient Name: Date of Service: [name] 06/20/2021 9:30 A M Medical Record Number: [id number] Patient Account Number: [id number] Date of Birth/Sex: Treating RN: 1951-05-09 (70 y.o. [name], [name] Primary Care Shakerra Red: [name] Other Clinician: Referring Akirah Storck: Treating Aiyannah Fayad/Extender: Cornelious Bryant in Treatment: 9 Encounter Discharge Information Items Discharge Condition: Stable Ambulatory Status: Ambulatory Discharge Destination: Home Transportation: Private Auto Accompanied By: self Schedule Follow-up Appointment: Yes Clinical Summary of Care: Patient Declined Electronic Signature(s) Signed: 06/20/2021 1:16:02 PM By: Rhae Hammock RN Entered By: Rhae Hammock on 06/20/2021 09:57:26 -------------------------------------------------------------------------------- Patient/Caregiver Education Details Patient Name: Date of Service: [name] 12/27/2022andnbsp9:30 A M  Medical Record Number: [id number] Patient Account Number: [id number] Date of Birth/Gender: Treating RN: 09-26-1950 (70 y.o. [name] Primary Care Physician: [name] Other Clinician: Referring Physician: Treating Physician/Extender: Cornelious Bryant in Treatment: 9 Education Assessment Education Provided To: Patient Education Topics Provided Infection: Methods: Explain/Verbal Responses: State content correctly [employer]) Signed: 06/20/2021 1:16:02 PM By: Rhae Hammock RN Entered By: Rhae Hammock on 06/20/2021 10:26:55 -------------------------------------------------------------------------------- Wound Assessment Details Patient Name: Date of Service: [name] 06/20/2021 9:30 A M Medical Record Number: [id number] Patient Account Number: [id number] Date of Birth/Sex: Treating RN: 10-31-1950 (70 y.o. [name], [name] Primary Care Taivon Haroon: [name] Other Clinician: Referring Meryn Sarracino: Treating Nevaeha Finerty/Extender: Cornelious Bryant in Treatment: 9 Wound Status Wound Number: 18 Primary Etiology: Diabetic Wound/Ulcer of the Lower Extremity Wound Location: Left, Plantar Metatarsal head first Wound Status: Open Wounding Event: Gradually Appeared Date Acquired: 08/23/2020 Weeks Of Treatment: 9 Clustered Wound: No Wound Measurements Length: (cm) 2.6 Width: (cm) 2.9 Depth: (cm) 0.1 Area: (cm) 5.922 Volume: (cm) 0.592 % Reduction in Area: 58.1% % Reduction in Volume: 79.1% Wound Description Classification: Grade 2 Exudate Amount: Medium Exudate Type: Serosanguineous Exudate Color: red, brown Treatment Notes Wound #18 (Metatarsal head first) Wound Laterality: Plantar, Left Cleanser Soap and Water Discharge Instruction: May shower and wash wound with dial antibacterial soap and water prior to dressing change. Wound Cleanser Discharge Instruction: Cleanse the wound with wound cleanser prior to applying a clean dressing using gauze sponges, not tissue or cotton balls. Peri-Wound Care Triamcinolone 15 (g) Discharge Instruction: Use triamcinolone 15 (g) as directed Zinc Oxide Ointment 30g tube Discharge Instruction: Apply Zinc Oxide to periwound with each dressing change Sween Lotion (Moisturizing lotion) Discharge Instruction: Apply moisturizing lotion as directed Topical Primary Dressing Hydrofera Blue Classic Foam, 4x4 in Discharge Instruction: Moisten with saline prior to applying to wound bed Secondary Dressing ABD Pad, 5x9 Discharge Instruction: Apply over primary dressing as directed. Zetuvit Plus 4x8 in Discharge Instruction: Apply over primary dressing as directed. CarboFLEX Odor Control Dressing, 4x4 in Discharge Instruction: Apply over primary dressing as directed. Secured With Compression Wrap Unnaboot w/Calamine, 4x10 (in/yd) Discharge Instruction: Apply Unnaboot as directed. May also  use Miliken CoFlex Calamine 2 layer compression system as alternative. Compression Stockings Add-Ons Electronic Signature(s) Signed: 06/20/2021 1:16:02 PM By: Rhae Hammock RN Entered By: Rhae Hammock on 06/20/2021 09:46:29 -------------------------------------------------------------------------------- Wound Assessment Details Patient Name: Date of Service: [name] 06/20/2021 9:30 A M Medical Record Number: [id number] Patient Account Number: [id number] Date of Birth/Sex: Treating RN: [birth year] (70 y.o. [name], [name] Primary Care Kirstie Larsen: [name] Other Clinician: Referring Graeme Menees: Treating Meir Elwood/Extender: Cornelious Bryant in Treatment: 9 Wound Status Wound Number: 19 Primary Etiology: Diabetic Wound/Ulcer of the Lower Extremity Wound Location: Left, Anterior Lower Leg Wound Status: Open Wounding Event: Gradually Appeared Date Acquired: 08/23/2020 Weeks Of Treatment: 9 Clustered Wound: No Wound Measurements Length: (cm) 2.7 Width: (cm) 1.8 Depth: (cm) 0.1 Area: (cm) 3.817 Volume: (cm) 0.382 % Reduction in Area: 73.6% % Reduction in Volume: 91.2% Wound Description Classification: Grade 2 Exudate Amount: Medium Exudate Type: Serosanguineous Exudate Color: red, brown Treatment Notes Wound #19 (Lower Leg) Wound Laterality: Left, Anterior Cleanser Soap and Water Discharge Instruction: May shower and wash wound with dial antibacterial soap and water prior to dressing change. Wound Cleanser Discharge Instruction: Cleanse the wound with wound cleanser prior to applying a clean dressing using gauze sponges, not tissue or cotton balls. Peri-Wound Care Triamcinolone 15 (  g) Discharge Instruction: Use triamcinolone 15 (g) as directed Zinc Oxide Ointment 30g tube Discharge Instruction: Apply Zinc Oxide to periwound with each dressing change Sween Lotion (Moisturizing lotion) Discharge Instruction: Apply moisturizing  lotion as directed Topical Primary Dressing Hydrofera Blue Classic Foam, 4x4 in Discharge Instruction: Moisten with saline prior to applying to wound bed Secondary Dressing ABD Pad, 5x9 Discharge Instruction: Apply over primary dressing as directed. Zetuvit Plus 4x8 in Discharge Instruction: Apply over primary dressing as directed. Secured With Compression Wrap Unnaboot w/Calamine, 4x10 (in/yd) Discharge Instruction: Apply Unnaboot as directed. May also use Miliken CoFlex Calamine 2 layer compression system as alternative. Compression Stockings Add-Ons Electronic Signature(s) Signed: 06/20/2021 1:16:02 PM By: Rhae Hammock RN Entered By: Rhae Hammock on 06/20/2021 09:46:29 -------------------------------------------------------------------------------- Wound Assessment Details Patient Name: Date of Service: [name] 06/20/2021 9:30 A M Medical Record Number: [id number] Patient Account Number: [id number] Date of Birth/Sex: Treating RN: Dec 18, 1950 (70 y.o. [name], [name] Primary Care Jeanine Caven: [name] Other Clinician: Referring Dacie Mandel: Treating [place] Zaragoza/Extender: Cornelious Bryant in Treatment: 9 Wound Status Wound Number: 22 Primary Etiology: Cyst Wound Location: Left, Lateral Lower Leg Wound Status: Open Wounding Event: Bump Date Acquired: 06/03/2021 Weeks Of Treatment: 2 Clustered Wound: No Wound Measurements Length: (cm) 4 Width: (cm) 2.5 Depth: (cm) 1.8 Area: (cm) 7.854 Volume: (cm) 14.137 % Reduction in Area: -376.3% % Reduction in Volume: -971.8% Wound Description Classification: Full Thickness Without Exposed Support Structu Exudate Amount: Large Exudate Type: Sanguinous Exudate Color: red res Treatment Notes Wound #22 (Lower Leg) Wound Laterality: Left, Lateral Cleanser Soap and Water Discharge Instruction: May shower and wash wound with dial antibacterial soap and water prior to dressing  change. Wound Cleanser Discharge Instruction: Cleanse the wound with wound cleanser prior to applying a clean dressing using gauze sponges, not tissue or cotton balls. Peri-Wound Care Triamcinolone 15 (g) Discharge Instruction: Use triamcinolone 15 (g) as directed Zinc Oxide Ointment 30g tube Discharge Instruction: Apply Zinc Oxide to periwound with each dressing change Sween Lotion (Moisturizing lotion) Discharge Instruction: Apply moisturizing lotion as directed Topical Primary Dressing Hydrofera Blue Classic Foam, 4x4 in Discharge Instruction: Moisten with saline prior to applying to wound bed Secondary Dressing ABD Pad, 5x9 Discharge Instruction: Apply over primary dressing as directed. Zetuvit Plus 4x8 in Discharge Instruction: Apply over primary dressing as directed. Secured With Compression Wrap Unnaboot w/Calamine, 4x10 (in/yd) Discharge Instruction: Apply Unnaboot as directed. May also use Miliken CoFlex Calamine 2 layer compression system as alternative. Compression Stockings Add-Ons Electronic Signature(s) Signed: 06/20/2021 1:16:02 PM By: Rhae Hammock RN Entered By: Rhae Hammock on 06/20/2021 09:46:29 -------------------------------------------------------------------------------- Vitals Details Patient Name: Date of Service: [name] 06/20/2021 9:30 A M Medical Record Number: [id number] Patient Account Number: [id number] Date of Birth/Sex: Treating RN: 1950-11-02 (70 y.o. [name], [name] Primary Care Yuchen Fedor: [name] Other Clinician: Referring Ren Grasse: Treating Fayetta Sorenson/Extender: Cornelious Bryant in Treatment: 9 Vital Signs Time Taken: 09:45 Temperature (F): 98.6 Height (in): 74 Pulse (bpm): 71 Weight (lbs): 238 Respiratory Rate (breaths/min): 17 Body Mass Index (BMI): 30.6 Blood Pressure (mmHg): 128/81 Capillary Blood Glucose (mg/dl): 172 Reference Range: 80 - 120 mg / dl Electronic  Signature(s) Signed: 06/20/2021 1:16:02 PM By: Rhae Hammock RN Entered By: Rhae Hammock on 06/20/2021 09:55:34

## 2021-06-22 ENCOUNTER — Other Ambulatory Visit: Payer: Self-pay

## 2021-06-22 ENCOUNTER — Encounter (HOSPITAL_BASED_OUTPATIENT_CLINIC_OR_DEPARTMENT_OTHER): Payer: BC Managed Care – PPO | Admitting: Internal Medicine

## 2021-06-22 DIAGNOSIS — E11621 Type 2 diabetes mellitus with foot ulcer: Secondary | ICD-10-CM | POA: Diagnosis not present

## 2021-06-22 NOTE — Progress Notes (Signed)
SHOAIB, SIEFKER ([id number]) Visit Report for 06/22/2021 HPI Details Patient Name: Date of Service: [name], [name] 06/22/2021 9:45 A M Medical Record Number: [id number] Patient Account Number: [id number] Date of Birth/Sex: Treating RN: 03-17-1951 (70 y.o. [name] Primary Care Provider: [name] Other Clinician: Referring Provider: Treating Provider/Extender: Stormy Card in Treatment: 10 History of Present Illness HPI Description: 10/11/17; Mr. Harren is a 70 year old man who tells me that in 2015 he slipped down the latter traumatizing his left leg. He developed a wound in the same spot the area that we are currently looking at. He states this closed over for the most part although he always felt it was somewhat unstable. In 2016 he hit the same area with the door of his car had this reopened. He tells me that this is never really closed although sometimes an inflow it remains open on a constant basis. He has not been using any specific dressing to this except for topical antibiotics the nature of which were not really sure. His primary doctor did send him to see Dr. [name] of interventional cardiology. He underwent an angiogram on 08/06/17 and he underwent a PTA and directional atherectomy of the lesser distal SFA and popliteal arteries which resulted in brisk improvement in blood flow. It was noted that he had 2 vessel runoff through the anterior tibial and peroneal. He is also been to see vascular and interventional radiologist. He was not felt to have any significant superficial venous insufficiency. Presumably is not a candidate for any ablation. It was suggested he come here for wound care. The patient is a type II diabetic on insulin. He also has a history of venous insufficiency. ABIs on the left were noncompressible in our clinic 10/21/17; patient we admitted to the clinic last week. 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 We put Iodosorb on him after an aggressive debridement and 3 layer compression. He complained of pain in his ankle and itching with is skin in fact he scratched the area on the medial calf superiorly at the rim of our wraps and he has 2 small open areas in that location today which are new. I changed his primary dressing today to silver collagen. As noted he is already had revascularization and does not have any significant superficial venous insufficiency that would be amenable to ablation 10/28/17; patient admitted to the clinic 2 weeks ago. He has a smaller Wound. Scratch injury from last week revealed. There is large wound over the tibial area. This is smaller. Granulation looks healthy. No need for debridement. 11/04/17; the wound on the left lateral calf looks better. Improved dimensions. Surface of this looks better. We've been maintaining him and Kerlix Coban wraps. He finds this much more comfortable. Silver collagen dressing 11/11/17; left lateral Wound continues to look healthy be making progress. Using a #5 curet I removed removed nonviable skin from the surface of the wound and then necrotic debris from the wound surface. Surface of the wound continues to look healthy. He also has an open area on the left great toenail bed. We've been using topical antibiotics. 11/19/17; left anterior lateral wound continues to look healthy but it's not closed. He also had a small wound above this on the left leg Initially traumatic wounds in the setting of significant chronic venous insufficiency and stasis dermatitis 11/25/17; left anterior wounds superiorly is closed still a small wound inferiorly.  12/02/17; left anterior tibial area. Arrives today with adherent callus. Post debridement clearly not completely closed. Hydrofera Blue under 3 layer compression. 12/09/17; left anterior tibia. Circumferential eschar however the wound bed looks stable to improved. We've been  using Hydrofera Blue under 3 layer compression 12/17/17; left anterior tibia. Apparently this was felt to be closed however when the wrap was taken off there is a skin tear to reopen wounds in the same area we've been using Hydrofera Blue under 3 layer compression 12/23/17 left anterior tibia. Not close to close this week apparently the [facility] was stuck to this again. Still circumferential eschar requiring debridement. I put a contact layer on this this time under the Hydrofera Blue 12/31/17; left anterior tibia. Wound is better slight amount of hyper-granulation. Using Hydrofera Blue over Adaptic. 01/07/18; left anterior tibia. The wound had some surface eschar however after this was removed he has no open wound.he was already revascularized by Dr. [name] when he came to our clinic with atherectomy of the left SFA and popliteal artery. He was also sent to interventional radiology for venous reflux studies. He was not felt to have significant reflux but certainly has chronic venous changes of his skin with hemosiderin deposition around this area. He will definitely need to lubricate his skin and wear compression stocking and I've talked to him about this. READMISSION 05/26/2018 This is a now 70 year old man we cared for with traumatic wounds on his left anterior lower extremity. He had been previously revascularized during that admission by Dr. [name]. Apparently in follow-up Dr. [name] noted that he had deterioration in his arterial status. He underwent a stent placement in the distal left SFA on 04/22/2018. 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 He went back to the angiography suite on 04/30/2018 he underwent PTA and balloon angioplasty of the occluded left mid anterior tibial artery, thrombotic occlusion went from 100 to 0% which reconstitutes the posterior tibial artery. He had thrombectomy and aspiration of the peroneal artery. The stent placed in the distal SFA left SFA was  still occluded. 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 He has been recuperating at [place] home in [facility] after the most recent hospitalization. He is going home tomorrow. He tells me that 2 weeks ago he traumatized the tip of his left fifth toe. He came in urgently for our review of this. This was a history of before I noted that Dr. [name] had already noted dry gangrenous changes of the left fifth toe 06/09/2018; 2-week follow-up. I did contact Dr. [name] after his last appointment and he apparently saw 1 of Dr. Irven Shelling colleagues the next day. He does not follow-up with Dr. [name] himself until Thursday of this week. 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 Nevertheless there is no evidence of infection no drainage and no pain. He had a new area that this week when we were signing him in today on the left anterior mid tibia area, this is in close proximity to the previous wound we have dealt with in this clinic. 06/23/2018; 2-week follow-up. I did not receive a recent note from Dr. [name] to review today. Our office is trying to obtain this. He is  apparently not planning to do further vascular interventions and wondered about compression to try and help with the patient's chronic venous insufficiency. However we are also concerned about the arterial flow. He arrives in clinic today with a new area on the left third toe. The areas on the calf/anterior tibia are close to closing. The left fifth toe is still mummified using Betadine. -In reviewing things with the patient he has what sounds like claudication with mild to moderate amount of activity. 06/27/2018; x-ray of his foot suggested osteomyelitis of the left third  toe. I prescribed Levaquin over the phone while we attempted to arrange a plan of care. However the patient called yesterday to report he had low-grade fever and he came in today acutely. There is been a marked deterioration in the left third toe with spreading cellulitis up into the dorsal left foot. He was referred to the emergency room. Readmission: 06/29/2020 patient presents today for reevaluation here in our clinic he was previously treated by Dr. Dellia Nims at the latter part of 2019 in 2 the beginning of 2020. Subsequently we have not seen him since that time in the interim he did have evaluation with vein and vascular specialist specifically Dr. Anice Paganini who did perform quite extensive work for a left femoral to anterior tibial artery bypass. With that being said in the interim the patient has developed significant lymphedema and has wounds that he tells me have really never healed in regard to the incision site on the left leg. He also has multiple wounds on the feet for various reasons some of which is that he tends to pick at his feet. Fortunately there is no signs of active infection systemically at this time he does have some wounds that are little bit deeper but most are fairly superficial he seems to have good blood flow and overall everything appears to be healthy I see no bone exposed and no obvious signs of osteomyelitis. I do not know that he necessarily needs a x-ray at this point although that something we could consider depending on how things progress. The patient does have a history of lymphedema, diabetes, this is type II, chronic kidney disease stage III, hypertension, and history of peripheral vascular disease. 07/05/2020; patient admitted last week. Is a patient I remember from 2019 he had a spreading infection involving the left foot and we sent him to the hospital. 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 He subsequently had a left  femoral to anterior tibial bypass by Dr.Cain vein and vascular. He also has severe lymphedema with chronic skin changes related to that on the left leg. The most problematic area that was new today was on the left medial great toe. This was apparently a small area last week there was purulent drainage which our intake nurse cultured. Also areas on the left medial foot and heel left lateral foot. He has 2 areas on the left medial calf left lateral calf in the setting of the severe lymphedema. 07/13/2020 on evaluation today patient appears to be doing better in my opinion compared to his last visit. The good news is there is no signs of active infection systemically and locally I do not see any signs of infection either. He did have an x-ray which was negative that is great news he had a culture which showed MRSA but at the same time he is been on the doxycycline which has helped. I do think we  may want to extend this for 7 additional days 1/25; patient admitted to the clinic a few weeks ago. 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 We have been putting him under compression his edema control is a lot better but he is severe verricused skin on the left leg. He is really done quite well he still has an open area on the left medial calf and the left medial first metatarsal head. We have been using silver collagen on the leg silver alginate on the foot 07/27/2020 upon evaluation today patient appears to be doing decently well in regard to his wounds. He still has a lot of dry skin on the left leg. Some of this is starting to peel back and I think he may be able to have them out by removing some that today. Fortunately there is no signs of active infection at this time on the left leg although on the right leg he does appear to have swelling and erythema as well as some mild warmth to touch. This does have been concerned about the possibility of cellulitis although  within the differential diagnosis I do think that potentially a DVT has to be at least considered. We need to rule that out before proceeding would just call in the cellulitis. Especially since he is having pain in the posterior aspect of his calf muscle. 2/8; the patient had seen sparingly. He has severe skin changes of chronic lymphedema in the left leg thickened hyperkeratotic verrucous skin. He has an open wound on the medial part of the left first met head left mid tibia. He also has a rim of nonepithelialized skin in the anterior mid tibia. He brought in the AmLactin lotion that was been prescribed although I am not sure under compression and its utility. There concern about cellulitis on the right lower leg the last time he was here. He was put on on antibiotics. His DVT rule out was negative. The right leg looks fine he is using his stocking on this area 08/10/2020 upon evaluation today patient appears to be doing well with regard to his leg currently. He has been tolerating the dressing changes without complication. Fortunately there is no signs of active infection which is great news. Overall very pleased with where things stand. 2/22; the patient still has an area on the medial part of the left first met his head. This looks better than when I last saw this earlier this month he has a rim of epithelialization but still some surface debris. Mostly everything on the left leg is healed. There is still a vulnerable in the left mid tibia area. 08/30/2020 upon evaluation today patient appears to be doing much better in regard to his wounds on his foot. 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 Nonetheless I think we will address that today I Minna send in a prescription for him in that regard. Overall though there does not appear to be any signs of significant  worsening. 09/07/2020 on evaluation today patient's wounds over his left foot appear to be doing excellent. I do not see any signs of infection there is some callus buildup this can require debridement for certain but overall I feel like he is managing quite nicely. He still using the AmLactin cream which has been beneficial for him as well. 3/22; left  foot wound is closed. There is no open area here. He is using ammonium lactate lotion to the lower extremities to help exfoliate dry cracked skin. He has compression stockings from elastic therapy in [place]. The wound on the medial part of his left first met head is healed today. READMISSION 04/12/2021 Mr. Eide is a patient we know fairly well he had a prolonged stay in clinic in 2019 with wounds on his left lateral and left anterior lower extremity in the setting of chronic venous insufficiency. More recently he was here earlier this year with predominantly an area on his left foot first metatarsal head plantar and he says the plantar foot broke down on its not long after we discharged him but he did not come back here. The last few months areas of broken down on his left anterior and again the left lateral lower extremity. The leg itself is very swollen chronically enlarged a lot of hyperkeratotic dry Berry Q skin in the left lower leg. His edema extends well into the thigh. He was seen by Dr. Donzetta Matters. He had ABIs on 03/02/2021 showing an ABI on the right of 1 with a TBI of 0.72 his ABI in the left at 1.09 TBI of 0.99. Monophasic and biphasic waveforms on the right. On the left monophasic waveforms were noted he went on to have an angiogram on 03/27/2021 this showed the aortic aortic and iliac segments were free of flow-limiting stenosis the left common femoral vein to evaluate the left femoral to anterior tibial artery bypass was unobstructed the bypass was patent without any areas of stenosis. We discharged the patient in bilateral juxta lite  stockings but very clearly that was not sufficient to control the swelling and maintain skin integrity. He is clearly going to need compression pumps. 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 This is fortunate because he is on his feet for long periods of time. 10/27; patient comes in with our intake nurse reporting copious amount of green drainage from the left anterior mid tibia the left dorsal foot and to a lesser extent the left medial mid tibia. We left the compression wrap on all week for the amount of edema in his left leg is quite a bit better. We use silver alginate as the primary dressing 11/3; edema control is good. Left anterior lower leg left medial lower leg and the plantar first metatarsal head. The left anterior lower leg required debridement. Deep tissue culture I did of this wound showed MRSA I put him on 10 days of doxycycline which she will start today. We have him in compression wraps. He has a security card and [employer] however he is retiring on November 15. We will need to then get him into a better offloading boot for the left foot perhaps a total contact cast 11/10; edema control is quite good. Left anterior and left medial lower leg wounds in the setting of chronic venous insufficiency and lymphedema. He also has a substantial area over the left plantar first metatarsal head. 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 He has significant hypergranulation on the left plantar foot wound. The patient is a diabetic but he does not have significant PAD 11/17; edema control is quite good. Left anterior and left medial lower leg wounds look better. The really concerning area remains the  area on the left plantar first metatarsal head. He has a rim of epithelialization. He has been using a surgical shoe The patient is now retired from a a  [employer] I have gone over with him the need to offload this area aggressively. Starting today with a forefoot off loader but . possibly a total contact cast. 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 Arrives with a very significant odor from I think all of the wounds on the left leg and the left foot. Because of this I did not put a total contact cast on him today but will could still consider this. His wife was having cataract surgery which is the reason he missed the appointment 12/6. I saw this man 5 days ago with a swelling below the popliteal fossa. I thought he actually might have a Baker's cyst however the DVT rule out study that we could arrange right away was negative the technician told me this was not a ruptured Baker's cyst. We attempted to get this aspirated by under ultrasound guidance in interventional radiology however all they did was an ultrasound however it shows an extensive fluid collection 62 x 8 x 9.4 in the left thigh and left calf. The patient states he thinks this started 8 days ago or so but he really is not complaining of any pain, fever or systemic symptoms. He has not ha 12/20; after some difficulty I managed to get the patient into see Dr. Donzetta Matters. Eventually he was taken into the hospital and had a drain put in the fluid collection below his left knee posteriorly extending into the posterior thigh. He still has the drain in place. Culture of this showed moderate staff aureus few Morganella and few Klebsiella he is now on doxycycline and ciprofloxacin as suggested by infectious disease he is on this for a month. The drain will remain in place until it stops draining 12/29; he comes in today with the 1 wound on his left leg and the area on the left plantar first met head significantly smaller. Both look healthy. He still has the drain in the left leg. He says  he has to change this daily. Follows up with Dr. Donzetta Matters on January 11. Electronic Signature(s) Signed: 06/22/2021 5:16:25 PM By: Linton Ham MD Entered By: Linton Ham on 06/22/2021 10:38:45 -------------------------------------------------------------------------------- Physical Exam Details Patient Name: Date of Service: [name] 06/22/2021 9:45 A M Medical Record Number: [id number] Patient Account Number: [id number] Date of Birth/Sex: Treating RN: 1950/07/30 (70 y.o. [name] Primary Care Provider: [name] Other Clinician: Referring Provider: Treating Provider/Extender: Stormy Card in Treatment: 10 Constitutional Sitting or standing Blood Pressure is within target range for patient.. Pulse regular and within target range for patient.Marland Kitchen Respirations regular, non-labored and within target range.. Temperature is normal and within the target range for the patient.Marland Kitchen Appears in no distress. Notes Wound exam; left plantar first metatarsal head and left lateral leg are considerably better. Healthy granulation measuring much smaller He still has the 2 drain sites 1 in the upper lateral left calf and the other in the lateral posterior thigh. There is open areas here with a drain in place still some scant fluid drainage noted on exam but there is no surrounding tenderness or crepitus the drain seems to be quite functional Electronic Signature(s) Signed: 06/22/2021 5:16:25 PM By:  Linton Ham MD Entered By: Linton Ham on 06/22/2021 10:39:51 -------------------------------------------------------------------------------- Physician Orders Details Patient Name: Date of Service: [name] 06/22/2021 9:45 A M Medical Record Number: [id number] Patient Account Number: [id number] Date of Birth/Sex: Treating RN: May 24, 1951 (70 y.o. [name] Primary Care Provider: [name] Other Clinician: Referring Provider: Treating  Provider/Extender: Stormy Card in Treatment: 10 Verbal / Phone Orders: No Diagnosis Coding Follow-up Appointments ppointment in 1 week. Dellia Nims on Thursday Return A Nurse Visit: - Tuesday Bathing/ Shower/ Hygiene May shower with protection but do not get wound dressing(s) wet. - Use a cast protector so you can shower without getting your wrap(s) wet Edema Control - Lymphedema / SCD / Other Elevate legs to the level of the heart or above for 30 minutes daily and/or when sitting, a frequency of: - throughout the day Avoid standing for long periods of time. Patient to wear own compression stockings every day. - on right leg; Moisturize legs daily. - Ammonium LACTATE to BLE every day. Off-Loading Wedge shoe to: - left front foot offloader Wound Treatment Wound #18 - Metatarsal head first Wound Laterality: Plantar, Left Cleanser: Soap and Water 1 x Per Week/7 Days Discharge Instructions: May shower and wash wound with dial antibacterial soap and water prior to dressing change. Cleanser: Wound Cleanser 1 x Per Week/7 Days Discharge Instructions: Cleanse the wound with wound cleanser prior to applying a clean dressing using gauze sponges, not tissue or cotton balls. Peri-Wound Care: Triamcinolone 15 (g) 1 x Per Week/7 Days Discharge Instructions: Use triamcinolone 15 (g) as directed Peri-Wound Care: Zinc Oxide Ointment 30g tube 1 x Per Week/7 Days Discharge Instructions: Apply Zinc Oxide to periwound with each dressing change Peri-Wound Care: Sween Lotion (Moisturizing lotion) 1 x Per Week/7 Days Discharge Instructions: Apply moisturizing lotion as directed Prim Dressing: Hydrofera Blue Classic Foam, 4x4 in 1 x Per Week/7 Days ary Discharge Instructions: Moisten with saline prior to applying to wound bed Secondary Dressing: ABD Pad, 5x9 1 x Per Week/7 Days Discharge Instructions: Apply over primary dressing as directed. Secondary Dressing: Zetuvit Plus 4x8 in 1 x  Per Week/7 Days Discharge Instructions: Apply over primary dressing as directed. Secondary Dressing: CarboFLEX Odor Control Dressing, 4x4 in 1 x Per Week/7 Days Discharge Instructions: Apply over primary dressing as directed. Compression Wrap: Unnaboot w/Calamine, 4x10 (in/yd) 1 x Per Week/7 Days Discharge Instructions: Apply Unnaboot as directed. May also use Miliken CoFlex Calamine 2 layer compression system as alternative. Wound #19 - Lower Leg Wound Laterality: Left, Anterior Cleanser: Soap and Water 1 x Per Week/7 Days Discharge Instructions: May shower and wash wound with dial antibacterial soap and water prior to dressing change. Cleanser: Wound Cleanser 1 x Per Week/7 Days Discharge Instructions: Cleanse the wound with wound cleanser prior to applying a clean dressing using gauze sponges, not tissue or cotton balls. Peri-Wound Care: Triamcinolone 15 (g) 1 x Per Week/7 Days Discharge Instructions: Use triamcinolone 15 (g) as directed Peri-Wound Care: Zinc Oxide Ointment 30g tube 1 x Per Week/7 Days Discharge Instructions: Apply Zinc Oxide to periwound with each dressing change Peri-Wound Care: Sween Lotion (Moisturizing lotion) 1 x Per Week/7 Days Discharge Instructions: Apply moisturizing lotion as directed Prim Dressing: Hydrofera Blue Classic Foam, 4x4 in 1 x Per Week/7 Days ary Discharge Instructions: Moisten with saline prior to applying to wound bed Secondary Dressing: ABD Pad, 5x9 1 x Per Week/7 Days Discharge Instructions: Apply over primary dressing as directed. Secondary Dressing: Zetuvit Plus 4x8 in  1 x Per Week/7 Days Discharge Instructions: Apply over primary dressing as directed. Compression Wrap: Unnaboot w/Calamine, 4x10 (in/yd) 1 x Per Week/7 Days Discharge Instructions: Apply Unnaboot as directed. May also use Miliken CoFlex Calamine 2 layer compression system as alternative. Wound #22 - Lower Leg Wound Laterality: Left, Lateral Cleanser: Soap and Water 1 x Per  Week/7 Days Discharge Instructions: May shower and wash wound with dial antibacterial soap and water prior to dressing change. Cleanser: Wound Cleanser 1 x Per Week/7 Days Discharge Instructions: Cleanse the wound with wound cleanser prior to applying a clean dressing using gauze sponges, not tissue or cotton balls. Peri-Wound Care: Triamcinolone 15 (g) 1 x Per Week/7 Days Discharge Instructions: Use triamcinolone 15 (g) as directed Peri-Wound Care: Zinc Oxide Ointment 30g tube 1 x Per Week/7 Days Discharge Instructions: Apply Zinc Oxide to periwound with each dressing change Peri-Wound Care: Sween Lotion (Moisturizing lotion) 1 x Per Week/7 Days Discharge Instructions: Apply moisturizing lotion as directed Prim Dressing: Hydrofera Blue Classic Foam, 4x4 in 1 x Per Week/7 Days ary Discharge Instructions: Moisten with saline prior to applying to wound bed Secondary Dressing: ABD Pad, 5x9 1 x Per Week/7 Days Discharge Instructions: Apply over primary dressing as directed. Secondary Dressing: Zetuvit Plus 4x8 in 1 x Per Week/7 Days Discharge Instructions: Apply over primary dressing as directed. Compression Wrap: Unnaboot w/Calamine, 4x10 (in/yd) 1 x Per Week/7 Days Discharge Instructions: Apply Unnaboot as directed. May also use Miliken CoFlex Calamine 2 layer compression system as alternative. Electronic Signature(s) Signed: 06/22/2021 5:07:42 PM By: Rhae Hammock RN Signed: 06/22/2021 5:16:25 PM By: Linton Ham MD Entered By: Rhae Hammock on 06/22/2021 10:35:02 -------------------------------------------------------------------------------- Problem List Details Patient Name: Date of Service: [name] 06/22/2021 9:45 A M Medical Record Number: [id number] Patient Account Number: [id number] Date of Birth/Sex: Treating RN: May 15, 1951 (70 y.o. [name], [name] Primary Care Provider: [name] Other Clinician: Referring Provider: Treating Provider/Extender: Stormy Card in Treatment: 10 Active Problems ICD-10 Encounter Code Description Active Date MDM Diagnosis E11.621 Type 2 diabetes mellitus with foot ulcer 04/12/2021 No Yes 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 04/12/2021 No Yes I89.0 Lymphedema, not elsewhere classified 04/12/2021 No Yes I87.322 Chronic venous hypertension (idiopathic) with inflammation of left lower 04/12/2021 No Yes extremity 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 ulcer of other part of left lower leg with other specified 04/12/2021 No Yes severity 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 of other part of left foot with other specified 04/12/2021 No Yes severity E11.42 Type 2 diabetes mellitus with diabetic polyneuropathy 04/12/2021 No Yes L02.416 Cutaneous abscess of left lower limb 06/13/2021 No Yes Inactive Problems Resolved Problems Electronic Signature(s) Signed: 06/22/2021 5:16:25 PM By: Linton Ham MD Entered By: Linton Ham on 06/22/2021 10:36:54 -------------------------------------------------------------------------------- Progress Note Details Patient Name: Date of Service: [name] 06/22/2021 9:45 A M Medical Record Number: [id number] Patient Account Number: [id number] Date of Birth/Sex: Treating RN: [birth year] (70 y.o. [name] Primary Care Provider: [name] Other Clinician: Referring Provider: Treating Provider/Extender: Stormy Card in Treatment: 10 Subjective History of Present Illness (HPI) 10/11/17; Mr. Horsey is a 70 year old man who tells me that in 2015 he slipped down the latter traumatizing his left leg. He developed a wound in the same spot the area that we are currently looking at. He states this closed over for the most part although he always felt it was somewhat unstable. In 2016 he hit the same area with the door of  his car had this reopened. He tells me that this is never really closed  although sometimes an inflow it remains open on a constant basis. He has not been using any specific dressing to this except for topical antibiotics the nature of which were not really sure. His primary doctor did send him to see Dr. [name] of interventional cardiology. He underwent an angiogram on 08/06/17 and he underwent a PTA and directional atherectomy of the lesser distal SFA and popliteal arteries which resulted in brisk improvement in blood flow. It was noted that he had 2 vessel runoff through the anterior tibial and peroneal. He is also been to see vascular and interventional radiologist. He was not felt to have any significant superficial venous insufficiency. Presumably is not a candidate for any ablation. It was suggested he come here for wound care. The patient is a type II diabetic on insulin. He also has a history of venous insufficiency. ABIs on the left were noncompressible in our clinic 10/21/17; patient we admitted to the clinic last week. 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 We put Iodosorb on him after an aggressive debridement and 3 layer compression. He complained of pain in his ankle and itching with is skin in fact he scratched the area on the medial calf superiorly at the rim of our wraps and he has 2 small open areas in that location today which are new. I changed his primary dressing today to silver collagen. As noted he is already had revascularization and does not have any significant superficial venous insufficiency that would be amenable to ablation 10/28/17; patient admitted to the clinic 2 weeks ago. He has a smaller Wound. Scratch injury from last week revealed. There is large wound over the tibial area. This is smaller. Granulation looks healthy. No need for debridement. 11/04/17; the wound on the left lateral calf looks better. Improved dimensions. Surface of this looks better. We've been  maintaining him and Kerlix Coban wraps. He finds this much more comfortable. Silver collagen dressing 11/11/17; left lateral Wound continues to look healthy be making progress. Using a #5 curet I removed removed nonviable skin from the surface of the wound and then necrotic debris from the wound surface. Surface of the wound continues to look healthy. ooHe also has an open area on the left great toenail bed. We've been using topical antibiotics. 11/19/17; left anterior lateral wound continues to look healthy but it's not closed. ooHe also had a small wound above this on the left leg ooInitially traumatic wounds in the setting of significant chronic venous insufficiency and stasis dermatitis 11/25/17; left anterior wounds superiorly is closed still a small wound inferiorly. 12/02/17; left anterior tibial area. Arrives today with adherent callus. Post debridement clearly not completely closed. Hydrofera Blue under 3 layer compression. 12/09/17; left anterior tibia. Circumferential eschar however the wound bed looks stable to improved. We've been using Hydrofera Blue under 3 layer compression 12/17/17; left anterior tibia. Apparently this was felt to be closed however when the wrap was taken off there is a skin tear to reopen wounds in the same area we've been using Hydrofera Blue under 3 layer compression 12/23/17 left anterior tibia. Not close to close this week apparently the [facility] was stuck to this again. Still circumferential eschar requiring debridement. I put a contact layer on this this time under the Hydrofera Blue 12/31/17; left anterior tibia. Wound is better slight amount of hyper-granulation.  Using Hydrofera Blue over Adaptic. 01/07/18; left anterior tibia. The wound had some surface eschar however after this was removed he has no open wound.he was already revascularized by Dr. [name] when he came to our clinic with atherectomy of the left SFA and popliteal artery. He was also sent to  interventional radiology for venous reflux studies. He was not felt to have significant reflux but certainly has chronic venous changes of his skin with hemosiderin deposition around this area. He will definitely need to lubricate his skin and wear compression stocking and I've talked to him about this. READMISSION 05/26/2018 This is a now 70 year old man we cared for with traumatic wounds on his left anterior lower extremity. He had been previously revascularized during that admission by Dr. [name]. Apparently in follow-up Dr. [name] noted that he had deterioration in his arterial status. He underwent a stent placement in the distal left SFA on 04/22/2018. 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 He went back to the angiography suite on 04/30/2018 he underwent PTA and balloon angioplasty of the occluded left mid anterior tibial artery, thrombotic occlusion went from 100 to 0% which reconstitutes the posterior tibial artery. He had thrombectomy and aspiration of the peroneal artery. The stent placed in the distal SFA left SFA was still occluded. 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 He has been rec[facility] after the most recent hospitalization. He is going home tomorrow. He tells me that 2 weeks ago he traumatized the tip of his left fifth toe. He came in urgently for our review of this. This was a history of before I noted that Dr. [name] had already noted dry gangrenous changes of the left fifth toe 06/09/2018; 2-week follow-up. I did contact Dr. [name] after his last appointment and he apparently saw 1 of Dr. Irven Shelling colleagues the next day. He does not follow-up with Dr. [name] himself until  Thursday of this week. 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 Nevertheless there is no evidence of infection no drainage and no pain. He had a new area that this week when we were signing him in today on the left anterior mid tibia area, this is in close proximity to the previous wound we have dealt with in this clinic. 06/23/2018; 2-week follow-up. I did not receive a recent note from Dr. [name] to review today. Our office is trying to obtain this. He is apparently not planning to do further vascular interventions and wondered about compression to try and help with the patient's chronic venous insufficiency. However we are also concerned about the arterial flow. ooHe arrives in clinic today with a new area on the left third toe. The areas on the calf/anterior tibia are close to closing. The left fifth toe is still mummified using Betadine. -In reviewing things with the patient he has what sounds like claudication with mild to moderate amount of activity. 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 I prescribed Levaquin over the phone while we attempted to arrange a plan of care. However the patient called yesterday to report he had low-grade fever and he came in today acutely. There is been a marked  deterioration in the left third toe with spreading cellulitis up into the dorsal left foot. He was referred to the emergency room. Readmission: 06/29/2020 patient presents today for reevaluation here in our clinic he was previously treated by Dr. Dellia Nims at the latter part of 2019 in 2 the beginning of 2020. Subsequently we have not seen him since that time in the interim he did have evaluation with vein and vascular specialist specifically Dr. Anice Paganini who did perform quite extensive work for a left femoral to anterior tibial artery bypass. With that being said in the interim the patient has developed significant lymphedema and has wounds that he  tells me have really never healed in regard to the incision site on the left leg. He also has multiple wounds on the feet for various reasons some of which is that he tends to pick at his feet. Fortunately there is no signs of active infection systemically at this time he does have some wounds that are little bit deeper but most are fairly superficial he seems to have good blood flow and overall everything appears to be healthy I see no bone exposed and no obvious signs of osteomyelitis. I do not know that he necessarily needs a x-ray at this point although that something we could consider depending on how things progress. The patient does have a history of lymphedema, diabetes, this is type II, chronic kidney disease stage III, hypertension, and history of peripheral vascular disease. 07/05/2020; patient admitted last week. Is a patient I remember from 2019 he had a spreading infection involving the left foot and we sent him to the hospital. 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 He subsequently had a left femoral to anterior tibial bypass by Dr.Cain vein and vascular. He also has severe lymphedema with chronic skin changes related to that on the left leg. The most problematic area that was new today was on the left medial great toe. This was apparently a small area last week there was purulent drainage which our intake nurse cultured. Also areas on the left medial foot and heel left lateral foot. He has 2 areas on the left medial calf left lateral calf in the setting of the severe lymphedema. 07/13/2020 on evaluation today patient appears to be doing better in my opinion compared to his last visit. The good news is there is no signs of active infection systemically and locally I do not see any signs of infection either. He did have an x-ray which was negative that is great news he had a culture which showed MRSA but at the same time he is been on the  doxycycline which has helped. I do think we may want to extend this for 7 additional days 1/25; patient admitted to the clinic a few weeks ago. 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 We have been putting him under compression his edema control is a lot better but he is severe verricused skin on the left leg. He is really done quite well he still has an open area on the left medial calf and the left medial first metatarsal head. We have been using silver collagen on the leg silver alginate on the foot 07/27/2020 upon evaluation today patient appears to be doing decently well in regard to his wounds. He still has a lot of dry skin on the left leg. Some of this is starting to peel back and  I think he may be able to have them out by removing some that today. Fortunately there is no signs of active infection at this time on the left leg although on the right leg he does appear to have swelling and erythema as well as some mild warmth to touch. This does have been concerned about the possibility of cellulitis although within the differential diagnosis I do think that potentially a DVT has to be at least considered. We need to rule that out before proceeding would just call in the cellulitis. Especially since he is having pain in the posterior aspect of his calf muscle. 2/8; the patient had seen sparingly. He has severe skin changes of chronic lymphedema in the left leg thickened hyperkeratotic verrucous skin. He has an open wound on the medial part of the left first met head left mid tibia. He also has a rim of nonepithelialized skin in the anterior mid tibia. He brought in the AmLactin lotion that was been prescribed although I am not sure under compression and its utility. There concern about cellulitis on the right lower leg the last time he was here. He was put on on antibiotics. His DVT rule out was negative. The right leg looks fine he is using his stocking  on this area 08/10/2020 upon evaluation today patient appears to be doing well with regard to his leg currently. He has been tolerating the dressing changes without complication. Fortunately there is no signs of active infection which is great news. Overall very pleased with where things stand. 2/22; the patient still has an area on the medial part of the left first met his head. This looks better than when I last saw this earlier this month he has a rim of epithelialization but still some surface debris. Mostly everything on the left leg is healed. There is still a vulnerable in the left mid tibia area. 08/30/2020 upon evaluation today patient appears to be doing much better in regard to his wounds on his foot. 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 Nonetheless I think we will address that today I Minna send in a prescription for him in that regard. Overall though there does not appear to be any signs of significant worsening. 09/07/2020 on evaluation today patient's wounds over his left foot appear to be doing excellent. I do not see any signs of infection there is some callus buildup this can require debridement for certain but overall I feel like he is managing quite nicely. He still using the AmLactin cream which has been beneficial for him as well. 3/22; left foot wound is closed. There is no open area here. He is using ammonium lactate lotion to the lower extremities to help exfoliate dry cracked skin. He has compression stockings from elastic therapy in [place]. The wound on the medial part of his left first met head is healed today. READMISSION 04/12/2021 Mr. Martorana is a patient we know fairly well he had a prolonged stay in clinic in 2019 with wounds on his left lateral and left anterior lower extremity in the setting of chronic venous insufficiency. More  recently he was here earlier this year with predominantly an area on his left foot first metatarsal head plantar and he says the plantar foot broke down on its not long after we discharged him but he did not come back here. The  last few months areas of broken down on his left anterior and again the left lateral lower extremity. The leg itself is very swollen chronically enlarged a lot of hyperkeratotic dry Berry Q skin in the left lower leg. His edema extends well into the thigh. He was seen by Dr. Donzetta Matters. He had ABIs on 03/02/2021 showing an ABI on the right of 1 with a TBI of 0.72 his ABI in the left at 1.09 TBI of 0.99. Monophasic and biphasic waveforms on the right. On the left monophasic waveforms were noted he went on to have an angiogram on 03/27/2021 this showed the aortic aortic and iliac segments were free of flow-limiting stenosis the left common femoral vein to evaluate the left femoral to anterior tibial artery bypass was unobstructed the bypass was patent without any areas of stenosis. We discharged the patient in bilateral juxta lite stockings but very clearly that was not sufficient to control the swelling and maintain skin integrity. He is clearly going to need compression pumps. 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 This is fortunate because he is on his feet for long periods of time. 10/27; patient comes in with our intake nurse reporting copious amount of green drainage from the left anterior mid tibia the left dorsal foot and to a lesser extent the left medial mid tibia. We left the compression wrap on all week for the amount of edema in his left leg is quite a bit better. We use silver alginate as the primary dressing 11/3; edema control is good. Left anterior lower leg left medial lower leg and the plantar first metatarsal head. The left anterior lower leg required debridement. Deep tissue culture I did of this wound showed  MRSA I put him on 10 days of doxycycline which she will start today. We have him in compression wraps. He has a security card and [employer] however he is retiring on November 15. We will need to then get him into a better offloading boot for the left foot perhaps a total contact cast 11/10; edema control is quite good. Left anterior and left medial lower leg wounds in the setting of chronic venous insufficiency and lymphedema. He also has a substantial area over the left plantar first metatarsal head. 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 He has significant hypergranulation on the left plantar foot wound. The patient is a diabetic but he does not have significant PAD 11/17; edema control is quite good. Left anterior and left medial lower leg wounds look better. The really concerning area remains the area on the left plantar first metatarsal head. He has a rim of epithelialization. 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 Starting today with a forefoot off loader but . possibly a total contact cast. 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 Arrives with a very significant odor from I think all of the wounds on the left leg and the left foot. Because of this I did not put a total contact cast on him today but will could still consider this. His wife  was having cataract surgery which is the reason he missed the appointment 12/6. I saw this man 5 days ago with a swelling below the popliteal fossa. I thought he actually might have a Baker's cyst however the DVT rule out study that we could arrange right away was negative the technician told me this was not a ruptured Baker's cyst. We  attempted to get this aspirated by under ultrasound guidance in interventional radiology however all they did was an ultrasound however it shows an extensive fluid collection 62 x 8 x 9.4 in the left thigh and left calf. The patient states he thinks this started 8 days ago or so but he really is not complaining of any pain, fever or systemic symptoms. He has not ha 12/20; after some difficulty I managed to get the patient into see Dr. Donzetta Matters. Eventually he was taken into the hospital and had a drain put in the fluid collection below his left knee posteriorly extending into the posterior thigh. He still has the drain in place. Culture of this showed moderate staff aureus few Morganella and few Klebsiella he is now on doxycycline and ciprofloxacin as suggested by infectious disease he is on this for a month. The drain will remain in place until it stops draining 12/29; he comes in today with the 1 wound on his left leg and the area on the left plantar first met head significantly smaller. Both look healthy. He still has the drain in the left leg. He says he has to change this daily. Follows up with Dr. Donzetta Matters on January 11. Objective Constitutional Sitting or standing Blood Pressure is within target range for patient.. Pulse regular and within target range for patient.Marland Kitchen Respirations regular, non-labored and within target range.. Temperature is normal and within the target range for the patient.Marland Kitchen Appears in no distress. Vitals Time Taken: 10:04 AM, Height: 74 in, Weight: 238 lbs, BMI: 30.6, Temperature: 98.5 F, Pulse: 77 bpm, Respiratory Rate: 17 breaths/min, Blood Pressure: 134/67 mmHg, Capillary Blood Glucose: 264 mg/dl. General Notes: Wound exam; left plantar first metatarsal head and left lateral leg are considerably better. Healthy granulation measuring much smaller He still has the 2 drain sites 1 in the upper lateral left calf and the other in the lateral posterior thigh. There is open areas here  with a drain in place still some scant fluid drainage noted on exam but there is no surrounding tenderness or crepitus the drain seems to be quite functional Integumentary (Hair, Skin) Wound #18 status is Open. Original cause of wound was Gradually Appeared. The date acquired was: 08/23/2020. The wound has been in treatment 10 weeks. The wound is located on the Left,Plantar Metatarsal head first. The wound measures 2.2cm length x 2.5cm width x 0.1cm depth; 4.32cm^2 area and 0.432cm^3 volume. There is no tunneling or undermining noted. There is a medium amount of serosanguineous drainage noted. The wound margin is distinct with the outline attached to the wound base. There is large (67-100%) red, pink granulation within the wound bed. There is no necrotic tissue within the wound bed. Wound #19 status is Open. Original cause of wound was Gradually Appeared. The date acquired was: 08/23/2020. The wound has been in treatment 10 weeks. The wound is located on the Left,Anterior Lower Leg. The wound measures 2cm length x 1.2cm width x 0.1cm depth; 1.885cm^2 area and 0.188cm^3 volume. There is no tunneling or undermining noted. There is a medium amount of serosanguineous drainage noted. There is large (67-100%) red, pink granulation within  the wound bed. There is no necrotic tissue within the wound bed. Wound #22 status is Open. Original cause of wound was Bump. The date acquired was: 06/03/2021. The wound has been in treatment 2 weeks. The wound is located on the Left,Lateral Lower Leg. The wound measures 3.4cm length x 3cm width x 1.5cm depth; 8.011cm^2 area and 12.017cm^3 volume. There is no tunneling or undermining noted. There is a medium amount of purulent drainage noted. The wound margin is distinct with the outline attached to the wound base. There is large (67-100%) red, pink granulation within the wound bed. There is a small (1-33%) amount of necrotic tissue within the wound bed including Eschar and  Adherent Slough. 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 There was a Haematologist Compression Therapy Procedure by Rhae Hammock, RN. Post procedure Diagnosis Wound #18: Same as Pre-Procedure Wound #19 Pre-procedure diagnosis of Wound #19 is a Diabetic Wound/Ulcer of the Lower Extremity located on the Left,Anterior Lower Leg . There was a Haematologist Compression Therapy Procedure by Rhae Hammock, RN. Post procedure Diagnosis Wound #19: Same as Pre-Procedure Wound #22 Pre-procedure diagnosis of Wound #22 is a Cyst located on the Left,Lateral Lower Leg . There was a Haematologist Compression Therapy Procedure by Rhae Hammock, RN. Post procedure Diagnosis Wound #22: Same as Pre-Procedure Plan Follow-up Appointments: Return Appointment in 1 week. Dellia Nims on Thursday Nurse Visit: - Tuesday Bathing/ Shower/ Hygiene: May shower with protection but do not get wound dressing(s) wet. - Use a cast protector so you can shower without getting your wrap(s) wet Edema Control - Lymphedema / SCD / Other: Elevate legs to the level of the heart or above for 30 minutes daily and/or when sitting, a frequency of: - throughout the day Avoid standing for long periods of time. Patient to wear own compression stockings every day. - on right leg; Moisturize  legs daily. - Ammonium LACTATE to BLE every day. Off-Loading: Wedge shoe to: - left front foot offloader WOUND #18: - Metatarsal head first Wound Laterality: Plantar, Left Cleanser: Soap and Water 1 x Per Week/7 Days Discharge Instructions: May shower and wash wound with dial antibacterial soap and water prior to dressing change. Cleanser: Wound Cleanser 1 x Per Week/7 Days Discharge Instructions: Cleanse the wound with wound cleanser prior to applying a clean dressing using gauze sponges, not tissue or cotton balls. Peri-Wound Care: Triamcinolone 15 (g) 1 x Per Week/7 Days Discharge Instructions: Use triamcinolone 15 (g) as directed Peri-Wound Care: Zinc Oxide Ointment 30g tube 1 x Per Week/7 Days Discharge Instructions: Apply Zinc Oxide to periwound with each dressing change Peri-Wound Care: Sween Lotion (Moisturizing lotion) 1 x Per Week/7 Days Discharge Instructions: Apply moisturizing lotion as directed Prim Dressing: Hydrofera Blue Classic Foam, 4x4 in 1 x Per Week/7 Days ary Discharge Instructions: Moisten with saline prior to applying to wound bed Secondary Dressing: ABD Pad, 5x9 1 x Per Week/7 Days Discharge Instructions: Apply over primary dressing as  directed. Secondary Dressing: Zetuvit Plus 4x8 in 1 x Per Week/7 Days Discharge Instructions: Apply over primary dressing as directed. Secondary Dressing: CarboFLEX Odor Control Dressing, 4x4 in 1 x Per Week/7 Days Discharge Instructions: Apply over primary dressing as directed. Com pression Wrap: Unnaboot w/Calamine, 4x10 (in/yd) 1 x Per Week/7 Days Discharge Instructions: Apply Unnaboot as directed. May also use Miliken CoFlex Calamine 2 layer compression system as alternative. WOUND #19: - Lower Leg Wound Laterality: Left, Anterior Cleanser: Soap and Water 1 x Per Week/7 Days Discharge Instructions: May shower and wash wound with dial antibacterial soap and water prior to dressing change. Cleanser: Wound Cleanser 1 x Per Week/7  Days Discharge Instructions: Cleanse the wound with wound cleanser prior to applying a clean dressing using gauze sponges, not tissue or cotton balls. Peri-Wound Care: Triamcinolone 15 (g) 1 x Per Week/7 Days Discharge Instructions: Use triamcinolone 15 (g) as directed Peri-Wound Care: Zinc Oxide Ointment 30g tube 1 x Per Week/7 Days Discharge Instructions: Apply Zinc Oxide to periwound with each dressing change Peri-Wound Care: Sween Lotion (Moisturizing lotion) 1 x Per Week/7 Days Discharge Instructions: Apply moisturizing lotion as directed Prim Dressing: Hydrofera Blue Classic Foam, 4x4 in 1 x Per Week/7 Days ary Discharge Instructions: Moisten with saline prior to applying to wound bed Secondary Dressing: ABD Pad, 5x9 1 x Per Week/7 Days Discharge Instructions: Apply over primary dressing as directed. Secondary Dressing: Zetuvit Plus 4x8 in 1 x Per Week/7 Days Discharge Instructions: Apply over primary dressing as directed. Com pression Wrap: Unnaboot w/Calamine, 4x10 (in/yd) 1 x Per Week/7 Days Discharge Instructions: Apply Unnaboot as directed. May also use Miliken CoFlex Calamine 2 layer compression system as alternative. WOUND #22: - Lower Leg Wound Laterality: Left, Lateral Cleanser: Soap and Water 1 x Per Week/7 Days Discharge Instructions: May shower and wash wound with dial antibacterial soap and water prior to dressing change. Cleanser: Wound Cleanser 1 x Per Week/7 Days Discharge Instructions: Cleanse the wound with wound cleanser prior to applying a clean dressing using gauze sponges, not tissue or cotton balls. Peri-Wound Care: Triamcinolone 15 (g) 1 x Per Week/7 Days Discharge Instructions: Use triamcinolone 15 (g) as directed Peri-Wound Care: Zinc Oxide Ointment 30g tube 1 x Per Week/7 Days Discharge Instructions: Apply Zinc Oxide to periwound with each dressing change Peri-Wound Care: Sween Lotion (Moisturizing lotion) 1 x Per Week/7 Days Discharge Instructions: Apply  moisturizing lotion as directed Prim Dressing: Hydrofera Blue Classic Foam, 4x4 in 1 x Per Week/7 Days ary Discharge Instructions: Moisten with saline prior to applying to wound bed Secondary Dressing: ABD Pad, 5x9 1 x Per Week/7 Days Discharge Instructions: Apply over primary dressing as directed. Secondary Dressing: Zetuvit Plus 4x8 in 1 x Per Week/7 Days Discharge Instructions: Apply over primary dressing as directed. Com pression Wrap: Unnaboot w/Calamine, 4x10 (in/yd) 1 x Per Week/7 Days Discharge Instructions: Apply Unnaboot as directed. May also use Miliken CoFlex Calamine 2 layer compression system as alternative. 1. I continued with Hydrofera Blue under compressionooUnna boot 2. Felt padding in his surgical shoe #3 sees Dr. Donzetta Matters on 1/11. He is still on ciprofloxacin and doxycycline for the culture of the drain fluid Electronic Signature(s) Signed: 06/22/2021 5:16:25 PM By: Linton Ham MD Entered By: Linton Ham on 06/22/2021 10:41:10 -------------------------------------------------------------------------------- SuperBill Details Patient Name: Date of Service: [name] 06/22/2021 Medical Record Number: [id number] Patient Account Number: [id number] Date of Birth/Sex: Treating RN: 04-17-1951 (70 y.o. [name] Primary Care Provider: [name] Other Clinician: Referring Provider: Treating  Provider/Extender: Stormy Card in Treatment: 10 Diagnosis Coding ICD-10 Codes Code Description E11.621 Type 2 diabetes mellitus with foot ulcer 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 I89.0 Lymphedema, not elsewhere classified I87.322 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verity 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 of other part of left foot with other specified  severity E11.42 Type 2 diabetes mellitus with diabetic polyneuropathy L02.416 Cutaneous abscess of left lower limb Facility Procedures Physician Procedures : CPT4 Code Description Modifier [id number] - WC PHYS LEVEL 3 - EST PT ICD-10 Diagnosis Description 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verity 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 of other part of  left foot with other specified severity L02.416 Cutaneous abscess of left lower limb Quantity: 1 Electronic Signature(s) Signed: 06/22/2021 5:07:42 PM By: Rhae Hammock RN Signed: 06/22/2021 5:16:25 PM By: Linton Ham MD Entered By: Rhae Hammock on 06/22/2021 11:03:16

## 2021-06-22 NOTE — Progress Notes (Signed)
LENNEX, PIETILA ([id number]) Visit Report for 06/20/2021 SuperBill Details Patient Name: Date of Service: [name], [name] 06/20/2021 Medical Record Number: [id number] Patient Account Number: [id number] Date of Birth/Sex: Treating RN: Apr 30, 1951 (70 y.o. [name], [name] Primary Care Provider: [name] Other Clinician: Referring Provider: Treating Provider/Extender: Cornelious Bryant in Treatment: 9 Diagnosis Coding ICD-10 Codes Code Description E11.621 Type 2 diabetes mellitus with foot ulcer 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 I89.0 Lymphedema, not elsewhere classified I87.322 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verity 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 of other part of left foot with other specified severity E11.42 Type 2 diabetes mellitus with diabetic polyneuropathy L02.416 Cutaneous abscess of left lower limb Facility Procedures CPT4 Code Description Modifier Quantity [id number] (Facility Use Only) [phone] - APPLY MULTLAY COMPRS LWR LT LEG 1 Electronic Signature(s) Signed: 06/20/2021 1:16:02 PM By: Rhae Hammock RN Signed: 06/22/2021 11:32:23 AM By: Kalman Shan DO Entered By: Rhae Hammock on 06/20/2021 09:57:35

## 2021-06-27 ENCOUNTER — Encounter (HOSPITAL_BASED_OUTPATIENT_CLINIC_OR_DEPARTMENT_OTHER): Payer: Medicare Other | Attending: Internal Medicine | Admitting: Internal Medicine

## 2021-06-27 ENCOUNTER — Other Ambulatory Visit: Payer: Self-pay

## 2021-06-27 DIAGNOSIS — B965 Pseudomonas (aeruginosa) (mallei) (pseudomallei) as the cause of diseases classified elsewhere: Secondary | ICD-10-CM | POA: Insufficient documentation

## 2021-06-27 DIAGNOSIS — L97528 Non-pressure chronic ulcer of other part of left foot with other specified severity: Secondary | ICD-10-CM | POA: Diagnosis not present

## 2021-06-27 DIAGNOSIS — E1151 Type 2 diabetes mellitus with diabetic peripheral angiopathy without gangrene: Secondary | ICD-10-CM | POA: Diagnosis not present

## 2021-06-27 DIAGNOSIS — B9562 Methicillin resistant Staphylococcus aureus infection as the cause of diseases classified elsewhere: Secondary | ICD-10-CM | POA: Diagnosis not present

## 2021-06-27 DIAGNOSIS — B961 Klebsiella pneumoniae [K. pneumoniae] as the cause of diseases classified elsewhere: Secondary | ICD-10-CM | POA: Insufficient documentation

## 2021-06-27 DIAGNOSIS — E11621 Type 2 diabetes mellitus with foot ulcer: Secondary | ICD-10-CM | POA: Insufficient documentation

## 2021-06-27 DIAGNOSIS — I87322 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: Secondary | ICD-10-CM | POA: Diagnosis not present

## 2021-06-27 DIAGNOSIS — E1142 Type 2 diabetes mellitus with diabetic polyneuropathy: Secondary | ICD-10-CM | POA: Diagnosis not present

## 2021-06-27 DIAGNOSIS — B9689 Other specified bacterial agents as the cause of diseases classified elsewhere: Secondary | ICD-10-CM | POA: Diagnosis not present

## 2021-06-27 DIAGNOSIS — I890 Lymphedema, not elsewhere classified: Secondary | ICD-10-CM | POA: Insufficient documentation

## 2021-06-27 DIAGNOSIS — L97828 Non-pressure chronic ulcer of other part of left lower leg with other specified severity: Secondary | ICD-10-CM | POA: Diagnosis not present

## 2021-06-27 DIAGNOSIS — L02416 Cutaneous abscess of left lower limb: Secondary | ICD-10-CM | POA: Diagnosis not present

## 2021-06-27 NOTE — Progress Notes (Signed)
CAM, DAUPHIN ([id number]) Visit Report for 06/27/2021 SuperBill Details Patient Name: Date of Service: [name], [name] 06/27/2021 Medical Record Number: [id number] Patient Account Number: [id number] Date of Birth/Sex: Treating RN: 1950/11/04 (71 y.o. [name] Primary Care Provider: [name] Other Clinician: Referring Provider: Treating Provider/Extender: Stormy Card in Treatment: 10 Diagnosis Coding ICD-10 Codes Code Description [phone] Type 2 diabetes mellitus with foot ulcer 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 I89.0 Lymphedema, not elsewhere classified I87.322 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verity 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 of other part of left foot with other specified severity E11.42 Type 2 diabetes mellitus with diabetic polyneuropathy L02.416 Cutaneous abscess of left lower limb Facility Procedures CPT4 Code Description Modifier Quantity [id number] (Facility Use Only) [phone] - APPLY [place] LWR LT LEG 1 ICD-10 Diagnosis Description 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verity Electronic Signature(s) Signed: 06/27/2021 9:45:57 AM By: Lorrin Jackson Signed: 06/27/2021 3:57:23 PM By: Linton Ham MD Entered By: Lorrin Jackson on 06/27/2021 09:45:55

## 2021-06-27 NOTE — Progress Notes (Signed)
[name], [name] ([id number]) Visit Report for 06/27/2021 Arrival Information Details Patient Name: Date of Service: [name], [name] 06/27/2021 9:00 A M Medical Record Number: [id number] Patient Account Number: [id number] Date of Birth/Sex: Treating RN: [birth year] (71 y.o. [name] Primary Care Emelda Kohlbeck: [name] Other Clinician: Referring Meigan Pates: Treating Gardenia Witter/Extender: Stormy Card in Treatment: 10 Visit Information History Since Last Visit Added or deleted any medications: No Patient Arrived: Ambulatory Any new allergies or adverse reactions: No Arrival Time: 09:16 Had a fall or experienced change in No Transfer Assistance: None activities of daily living that may affect Patient Identification Verified: Yes risk of falls: Secondary Verification Process Completed: Yes Signs or symptoms of abuse/neglect since No Patient Requires Transmission-Based Precautions: No last visito Patient Has Alerts: Yes Hospitalized since last visit: No Patient Alerts: ABI's: 09/22 L:1.09 R: 1. Implantable device outside of the clinic No TBI's: R:0.72 L:0.99 excluding cellular tissue based products placed in the center since last visit: Has Dressing in Place as Prescribed: Yes Has Compression in Place as Prescribed: Yes Has Footwear/Offloading in Place as Yes Prescribed: Left: Surgical Shoe with Pressure Relief Insole Pain Present Now: No Electronic Signature(s) Signed: 06/27/2021 3:19:23 PM By: Lorrin Jackson Entered By: Lorrin Jackson on 06/27/2021 09:17:18 -------------------------------------------------------------------------------- Compression Therapy Details Patient Name: Date of Service: [name]. 06/27/2021 9:00 A M Medical Record Number: [id number] Patient Account Number: [id number] Date of Birth/Sex: Treating RN: 02/10/1951 (71 y.o. [name] Primary Care Turon Kilmer: [name] Other Clinician: Referring Tifanie Gardiner: Treating  Doc Mandala/Extender: Stormy Card in Treatment: 10 Compression Therapy Performed for Wound Assessment: Wound #18 Left,Plantar Metatarsal head first Performed By: Clinician Lorrin Jackson, RN Compression Type: Double Layer Electronic Signature(s) Signed: 06/27/2021 9:44:17 AM By: Lorrin Jackson Entered By: Lorrin Jackson on 06/27/2021 09:44:17 -------------------------------------------------------------------------------- Compression Therapy Details Patient Name: Date of Service: [name]. 06/27/2021 9:00 A M Medical Record Number: [id number] Patient Account Number: [id number] Date of Birth/Sex: Treating RN: 1951-02-05 (71 y.o. [name] Primary Care Dhyana Bastone: [name] Other Clinician: Referring [name] Latour: Treating Katarzyna Wolven/Extender: Stormy Card in Treatment: 10 Compression Therapy Performed for Wound Assessment: Wound #19 Left,Anterior Lower Leg Performed By: Clinician Lorrin Jackson, RN Compression Type: Double Layer Electronic Signature(s) Signed: 06/27/2021 9:44:18 AM By: Lorrin Jackson Entered By: Lorrin Jackson on 06/27/2021 09:44:18 -------------------------------------------------------------------------------- Compression Therapy Details Patient Name: Date of Service: [name]. 06/27/2021 9:00 A M Medical Record Number: [id number] Patient Account Number: [id number] Date of Birth/Sex: Treating RN: 07/10/1950 (71 y.o. [name] Primary Care Conner Neiss: [name] Other Clinician: Referring Oleta Gunnoe: Treating Claris Guymon/Extender: Stormy Card in Treatment: 10 Compression Therapy Performed for Wound Assessment: Wound #22 Left,Lateral Lower Leg Performed By: Clinician Lorrin Jackson, RN Compression Type: Double Layer Electronic Signature(s) Signed: 06/27/2021 9:44:18 AM By: Lorrin Jackson Entered By: Lorrin Jackson on 06/27/2021  09:44:18 -------------------------------------------------------------------------------- Encounter Discharge Information Details Patient Name: Date of Service: [name]. 06/27/2021 9:00 A M Medical Record Number: [id number] Patient Account Number: [id number] Date of Birth/Sex: Treating RN: May 13, 1951 (71 y.o. [name] Primary Care Mikalyn Hermida: [name] Other Clinician: Referring Ansen Sayegh: Treating Kamsiyochukwu Spickler/Extender: Stormy Card in Treatment: 10 Encounter Discharge Information Items Discharge Condition: Stable Ambulatory Status: Ambulatory Discharge Destination: Home Transportation: Private Auto Schedule Follow-up Appointment: Yes Clinical Summary of Care: Provided on 06/27/2021 Form Type Recipient Paper Patient Patient Electronic Signature(s) Signed: 06/27/2021 9:45:38 AM By: Lorrin Jackson Entered By: Lorrin Jackson on 06/27/2021  09:45:37 -------------------------------------------------------------------------------- Patient/Caregiver Education Details Patient Name: Date of Service: [name], [name] 1/3/2023andnbsp9:00 A M Medical Record Number: [id number] Patient Account Number: [id number] Date of Birth/Gender: Treating RN: Jan 21, 1951 (71 y.o. [name] Primary Care Physician: [name] Other Clinician: Referring Physician: Treating Physician/Extender: Stormy Card in Treatment: 10 Education Assessment Education Provided To: Patient Education Topics Provided Venous: Methods: Demonstration, Explain/Verbal Responses: State content correctly Wound/Skin Impairment: Methods: Explain/Verbal, Printed Responses: State content correctly Electronic Signature(s) Signed: 06/27/2021 3:19:23 PM By: Lorrin Jackson Entered By: Lorrin Jackson on 06/27/2021 09:45:17 -------------------------------------------------------------------------------- Wound Assessment Details Patient Name: Date of Service: [name]. 06/27/2021 9:00 A M Medical Record Number: [id number] Patient Account Number: [id number] Date of Birth/Sex: Treating RN: 1951-06-05 (71 y.o. [name] Primary Care Elva Mauro: [name] Other Clinician: Referring [name] Vokes: Treating Deliana Avalos/Extender: Stormy Card in Treatment: 10 Wound Status Wound Number: 18 Primary Diabetic Wound/Ulcer of the Lower Extremity Etiology: Wound Location: Left, Plantar Metatarsal head first Wound Open Wounding Event: Gradually Appeared Status: Date Acquired: 08/23/2020 Comorbid Glaucoma, Sleep Apnea, Hypertension, Peripheral Arterial Disease, Weeks Of Treatment: 10 History: Peripheral Venous Disease, Type II Diabetes, Gout, Osteoarthritis, Clustered Wound: No Neuropathy Wound Measurements Length: (cm) 2.2 Width: (cm) 2.5 Depth: (cm) 0.1 Area: (cm) 4.32 Volume: (cm) 0.432 Wound Description Classification: Grade 2 Wound Margin: Distinct, outline attached Exudate Amount: Medium Exudate Type: Serosanguineous Exudate Color: red, brown Foul Odor After Cleansing: Slough/Fibrino % Reduction in Area: 69.4% % Reduction in Volume: 84.7% Epithelialization: Small (1-33%) Tunneling: No Undermining: No No No Wound Bed Granulation Amount: Large (67-100%) Exposed Structure Granulation Quality: Red, Pink Fascia Exposed: No Necrotic Amount: None Present (0%) Fat Layer (Subcutaneous Tissue) Exposed: Yes Tendon Exposed: No Muscle Exposed: No Joint Exposed: No Bone Exposed: No Treatment Notes Wound #18 (Metatarsal head first) Wound Laterality: Plantar, Left Cleanser Soap and Water Discharge Instruction: May shower and wash wound with dial antibacterial soap and water prior to dressing change. Wound Cleanser Discharge Instruction: Cleanse the wound with wound cleanser prior to applying a clean dressing using gauze sponges, not tissue or cotton balls. Peri-Wound Care Triamcinolone 15 (g) Discharge Instruction:  Use triamcinolone 15 (g) as directed Zinc Oxide Ointment 30g tube Discharge Instruction: Apply Zinc Oxide to periwound with each dressing change Sween Lotion (Moisturizing lotion) Discharge Instruction: Apply moisturizing lotion as directed Topical Primary Dressing Hydrofera Blue Classic Foam, 4x4 in Discharge Instruction: Moisten with saline prior to applying to wound bed Secondary Dressing ABD Pad, 5x9 Discharge Instruction: Apply over primary dressing as directed. Zetuvit Plus 4x8 in Discharge Instruction: Apply over primary dressing as directed. CarboFLEX Odor Control Dressing, 4x4 in Discharge Instruction: Apply over primary dressing as directed. Secured With Compression Wrap Unnaboot w/Calamine, 4x10 (in/yd) Discharge Instruction: Apply Unnaboot as directed. May also use Miliken CoFlex Calamine 2 layer compression system as alternative. Compression Stockings Add-Ons Electronic Signature(s) Signed: 06/27/2021 3:19:23 PM By: Lorrin Jackson Entered By: Lorrin Jackson on 06/27/2021 09:13:18 -------------------------------------------------------------------------------- Wound Assessment Details Patient Name: Date of Service: [name]. 06/27/2021 9:00 A M Medical Record Number: [id number] Patient Account Number: [id number] Date of Birth/Sex: Treating RN: 09-24-1950 (71 y.o. [name] Primary Care Xiong Haidar: [name] Other Clinician: Referring Bena Kobel: Treating Shantae Vantol/Extender: Stormy Card in Treatment: 10 Wound Status Wound Number: 19 Primary Diabetic Wound/Ulcer of the Lower Extremity Etiology: Wound Location: Left, Anterior Lower Leg Wound Open Wounding Event: Gradually Appeared Status: Date Acquired: 08/23/2020 Comorbid Glaucoma, Sleep Apnea, Hypertension, Peripheral Arterial Disease,  Weeks Of Treatment: 10 History: Peripheral Venous Disease, Type II Diabetes, Gout, Osteoarthritis, Clustered Wound: No Neuropathy Wound  Measurements Length: (cm) 2 Width: (cm) 1.2 Depth: (cm) 0.1 Area: (cm) 1.885 Volume: (cm) 0.188 % Reduction in Area: 87% % Reduction in Volume: 95.7% Epithelialization: Medium (34-66%) Tunneling: No Undermining: No Wound Description Classification: Grade 2 Wound Margin: Distinct, outline attached Exudate Amount: Medium Exudate Type: Serosanguineous Exudate Color: red, brown Foul Odor After Cleansing: No Slough/Fibrino No Wound Bed Granulation Amount: Large (67-100%) Exposed Structure Granulation Quality: Red, Pink Fascia Exposed: No Necrotic Amount: None Present (0%) Fat Layer (Subcutaneous Tissue) Exposed: Yes Tendon Exposed: No Muscle Exposed: No Joint Exposed: No Bone Exposed: No Treatment Notes Wound #19 (Lower Leg) Wound Laterality: Left, Anterior Cleanser Soap and Water Discharge Instruction: May shower and wash wound with dial antibacterial soap and water prior to dressing change. Wound Cleanser Discharge Instruction: Cleanse the wound with wound cleanser prior to applying a clean dressing using gauze sponges, not tissue or cotton balls. Peri-Wound Care Triamcinolone 15 (g) Discharge Instruction: Use triamcinolone 15 (g) as directed Zinc Oxide Ointment 30g tube Discharge Instruction: Apply Zinc Oxide to periwound with each dressing change Sween Lotion (Moisturizing lotion) Discharge Instruction: Apply moisturizing lotion as directed Topical Primary Dressing Hydrofera Blue Classic Foam, 4x4 in Discharge Instruction: Moisten with saline prior to applying to wound bed Secondary Dressing ABD Pad, 5x9 Discharge Instruction: Apply over primary dressing as directed. Zetuvit Plus 4x8 in Discharge Instruction: Apply over primary dressing as directed. Secured With Compression Wrap Unnaboot w/Calamine, 4x10 (in/yd) Discharge Instruction: Apply Unnaboot as directed. May also use Miliken CoFlex Calamine 2 layer compression system as alternative. Compression  Stockings Add-Ons Electronic Signature(s) Signed: 06/27/2021 3:19:23 PM By: Lorrin Jackson Entered By: Lorrin Jackson on 06/27/2021 09:14:10 -------------------------------------------------------------------------------- Wound Assessment Details Patient Name: Date of Service: [name]. 06/27/2021 9:00 A M Medical Record Number: [id number] Patient Account Number: [id number] Date of Birth/Sex: Treating RN: 03-11-1951 (71 y.o. [name] Primary Care Aqsa Sensabaugh: [name] Other Clinician: Referring Keldrick Pomplun: Treating Aleyda Gindlesperger/Extender: Stormy Card in Treatment: 10 Wound Status Wound Number: 22 Primary Cyst Etiology: Wound Location: Left, Lateral Lower Leg Wound Open Wounding Event: Bump Status: Date Acquired: 06/03/2021 Comorbid Glaucoma, Sleep Apnea, Hypertension, Peripheral Arterial Disease, Weeks Of Treatment: 3 History: Peripheral Venous Disease, Type II Diabetes, Gout, Osteoarthritis, Clustered Wound: No Neuropathy Wound Measurements Length: (cm) 3.4 Width: (cm) 3 Depth: (cm) 1.5 Area: (cm) 8.011 Volume: (cm) 12.017 % Reduction in Area: -385.8% % Reduction in Volume: -811.1% Epithelialization: None Tunneling: No Undermining: No Wound Description Classification: Full Thickness With Exposed Support Structures Wound Margin: Distinct, outline attached Exudate Amount: Medium Exudate Type: Purulent Exudate Color: yellow, brown, green Foul Odor After Cleansing: No Slough/Fibrino Yes Wound Bed Granulation Amount: Large (67-100%) Exposed Structure Granulation Quality: Red, Pink Fascia Exposed: No Necrotic Amount: Small (1-33%) Fat Layer (Subcutaneous Tissue) Exposed: Yes Necrotic Quality: Eschar, Adherent Slough Tendon Exposed: No Muscle Exposed: No Joint Exposed: No Bone Exposed: No Treatment Notes Wound #22 (Lower Leg) Wound Laterality: Left, Lateral Cleanser Soap and Water Discharge Instruction: May shower and wash wound  with dial antibacterial soap and water prior to dressing change. Wound Cleanser Discharge Instruction: Cleanse the wound with wound cleanser prior to applying a clean dressing using gauze sponges, not tissue or cotton balls. Peri-Wound Care Triamcinolone 15 (g) Discharge Instruction: Use triamcinolone 15 (g) as directed Zinc Oxide Ointment 30g tube Discharge Instruction: Apply Zinc Oxide to periwound with each dressing change Sween Lotion (  Moisturizing lotion) Discharge Instruction: Apply moisturizing lotion as directed Topical Primary Dressing Hydrofera Blue Classic Foam, 4x4 in Discharge Instruction: Moisten with saline prior to applying to wound bed Secondary Dressing ABD Pad, 5x9 Discharge Instruction: Apply over primary dressing as directed. Zetuvit Plus 4x8 in Discharge Instruction: Apply over primary dressing as directed. Secured With Compression Wrap Unnaboot w/Calamine, 4x10 (in/yd) Discharge Instruction: Apply Unnaboot as directed. May also use Miliken CoFlex Calamine 2 layer compression system as alternative. Compression Stockings Add-Ons Electronic Signature(s) Signed: 06/27/2021 3:19:23 PM By: Lorrin Jackson Entered By: Lorrin Jackson on 06/27/2021 09:15:19 -------------------------------------------------------------------------------- Vitals Details Patient Name: Date of Service: [name]. 06/27/2021 9:00 A M Medical Record Number: [id number] Patient Account Number: [id number] Date of Birth/Sex: Treating RN: 09/24/1950 (71 y.o. [name] Primary Care Orie Baxendale: [name] Other Clinician: Referring Faren Florence: Treating Alexie Samson/Extender: Stormy Card in Treatment: 10 Vital Signs Time Taken: 09:18 Temperature (F): 98.5 Height (in): 74 Pulse (bpm): 63 Weight (lbs): 238 Respiratory Rate (breaths/min): 18 Body Mass Index (BMI): 30.6 Blood Pressure (mmHg): 114/74 Capillary Blood Glucose (mg/dl): 139 Reference Range: 80 -  120 mg / dl Electronic Signature(s) Signed: 06/27/2021 3:19:23 PM By: Lorrin Jackson Entered By: Lorrin Jackson on 06/27/2021 09:19:15

## 2021-06-27 NOTE — Progress Notes (Signed)
[name], [name] ([id number]) Visit Report for 06/22/2021 Arrival Information Details Patient Name: Date of Service: [name], [name] 06/22/2021 9:45 A M Medical Record Number: [id number] Patient Account Number: [id number] Date of Birth/Sex: Treating RN: 21-Sep-1950 (71 y.o. [name], [name] Primary Care Ashrith Sagan: [name] Other Clinician: Referring Chayse Gracey: Treating Panda Crossin/Extender: Stormy Card in Treatment: 10 Visit Information History Since Last Visit Added or deleted any medications: No Patient Arrived: Ambulatory Any new allergies or adverse reactions: No Arrival Time: 10:04 Had a fall or experienced change in No Accompanied By: self activities of daily living that may affect Transfer Assistance: None risk of falls: Patient Identification Verified: Yes Signs or symptoms of abuse/neglect since last visito No Secondary Verification Process Completed: Yes Hospitalized since last visit: No Patient Requires Transmission-Based Precautions: No Implantable device outside of the clinic excluding No Patient Has Alerts: Yes cellular tissue based products placed in the center Patient Alerts: ABI's: 09/22 L:1.09 R: 1. since last visit: TBI's: R:0.72 L:0.99 Has Dressing in Place as Prescribed: Yes Has Compression in Place as Prescribed: Yes Pain Present Now: No Electronic Signature(s) Signed: 06/22/2021 5:07:42 PM By: Rhae Hammock RN Entered By: Rhae Hammock on 06/22/2021 10:04:37 -------------------------------------------------------------------------------- Compression Therapy Details Patient Name: Date of Service: [name]. 06/22/2021 9:45 A M Medical Record Number: [id number] Patient Account Number: [id number] Date of Birth/Sex: Treating RN: 1950/08/09 (71 y.o. [name], [name] Primary Care Waseem Suess: [name] Other Clinician: Referring Derionna Salvador: Treating Wilhelmina Hark/Extender: Stormy Card in  Treatment: 10 Compression Therapy Performed for Wound Assessment: Wound #18 Left,Plantar Metatarsal head first Performed By: Clinician Rhae Hammock, RN Compression Type: Rolena Infante Post Procedure Diagnosis Same as Pre-procedure Electronic Signature(s) Signed: 06/22/2021 5:07:42 PM By: Rhae Hammock RN Entered By: Rhae Hammock on 06/22/2021 10:33:15 -------------------------------------------------------------------------------- Compression Therapy Details Patient Name: Date of Service: [name]. 06/22/2021 9:45 A M Medical Record Number: [id number] Patient Account Number: [id number] Date of Birth/Sex: Treating RN: Oct 01, 1950 (71 y.o. [name], [name] Primary Care [name] [name]: [name] Other Clinician: Referring Tatanisha Cuthbert: Treating Michaeal Davis/Extender: Stormy Card in Treatment: 10 Compression Therapy Performed for Wound Assessment: Wound #19 Left,Anterior Lower Leg Performed By: Clinician Rhae Hammock, RN Compression Type: Rolena Infante Post Procedure Diagnosis Same as Pre-procedure Electronic Signature(s) Signed: 06/22/2021 5:07:42 PM By: Rhae Hammock RN Entered By: Rhae Hammock on 06/22/2021 10:33:15 -------------------------------------------------------------------------------- Compression Therapy Details Patient Name: Date of Service: [name]. 06/22/2021 9:45 A M Medical Record Number: [id number] Patient Account Number: [id number] Date of Birth/Sex: Treating RN: 25-Jul-1950 (71 y.o. [name], [name] Primary Care Bryant Saye: [name] Other Clinician: Referring Jlyn Cerros: Treating Maxamilian Amadon/Extender: Stormy Card in Treatment: 10 Compression Therapy Performed for Wound Assessment: Wound #22 Left,Lateral Lower Leg Performed By: Clinician Rhae Hammock, RN Compression Type: Rolena Infante Post Procedure Diagnosis Same as Pre-procedure Electronic Signature(s) Signed: 06/22/2021  5:07:42 PM By: Rhae Hammock RN Entered By: Rhae Hammock on 06/22/2021 10:33:15 -------------------------------------------------------------------------------- Encounter Discharge Information Details Patient Name: Date of Service: [name]. 06/22/2021 9:45 A M Medical Record Number: [id number] Patient Account Number: [id number] Date of Birth/Sex: Treating RN: [birth year] (71 y.o. [name], [name] Primary Care Morty Ortwein: [name] Other Clinician: Referring Francena Zender: Treating Carnel Stegman/Extender: Stormy Card in Treatment: 10 Encounter Discharge Information Items Discharge Condition: Stable Ambulatory Status: Ambulatory Discharge Destination: Home Transportation: Private Auto Accompanied By: self Schedule Follow-up Appointment: Yes Clinical Summary of Care: Patient Declined Electronic Signature(s) Signed: 06/22/2021 5:07:42 PM By: Hollie Salk,  [name] RN Entered By: Rhae Hammock on 06/22/2021 11:05:29 -------------------------------------------------------------------------------- Lower Extremity Assessment Details Patient Name: Date of Service: [name], [name] 06/22/2021 9:45 A M Medical Record Number: [id number] Patient Account Number: [id number] Date of Birth/Sex: Treating RN: 08-Dec-1950 (71 y.o. [name], [name] Primary Care Terris Germano: [name] Other Clinician: Referring Anjelique Makar: Treating Shaasia Odle/Extender: Stormy Card in Treatment: 10 Edema Assessment Assessed: [name]: Yes] [name]: No] Edema: [Left: Ye] [Right: s] Calf Left: Right: Point of Measurement: 41 cm From Medial Instep 43 cm Ankle Left: Right: Point of Measurement: 10 cm From Medial Instep 26.7 cm Vascular Assessment Pulses: Dorsalis Pedis Palpable: [Left:Yes] Posterior Tibial Palpable: [Left:Yes] Electronic Signature(s) Signed: 06/22/2021 5:07:42 PM By: Rhae Hammock RN Entered By: Rhae Hammock on 06/22/2021  10:06:39 -------------------------------------------------------------------------------- Multi Wound Chart Details Patient Name: Date of Service: [name]. 06/22/2021 9:45 A M Medical Record Number: [id number] Patient Account Number: [id number] Date of Birth/Sex: Treating RN: 27-Sep-1950 (71 y.o. [name], [name] Primary Care Sarae Nicholes: [name] Other Clinician: Referring Juanmanuel Marohl: Treating Jobeth Pangilinan/Extender: Stormy Card in Treatment: 10 Vital Signs Height(in): 74 Capillary Blood Glucose(mg/dl): 264 Weight(lbs): 238 Pulse(bpm): 73 Body Mass Index(BMI): 31 Blood Pressure(mmHg): 134/67 Temperature(F): 98.5 Respiratory Rate(breaths/min): 17 Photos: Left, Plantar Metatarsal head first Left, Anterior Lower Leg Left, Lateral Lower Leg Wound Location: Gradually Appeared Gradually Appeared Bump Wounding Event: Diabetic Wound/Ulcer of the Lower Diabetic Wound/Ulcer of the Lower Cyst Primary Etiology: Extremity Extremity Glaucoma, Sleep Apnea, Glaucoma, Sleep Apnea, Glaucoma, Sleep Apnea, Comorbid History: Hypertension, Peripheral Arterial Hypertension, Peripheral Arterial Hypertension, Peripheral Arterial Disease, Peripheral Venous Disease, Disease, Peripheral Venous Disease, Disease, Peripheral Venous Disease, Type II Diabetes, Gout, Osteoarthritis, Type II Diabetes, Gout, Osteoarthritis, Type II Diabetes, Gout, Osteoarthritis, Neuropathy Neuropathy Neuropathy 08/23/2020 08/23/2020 06/03/2021 Date Acquired: 10 10 2  Weeks of Treatment: Open Open Open Wound Status: 2.2x2.5x0.1 2x1.2x0.1 3.4x3x1.5 Measurements L x W x D (cm) 4.32 1.885 8.011 A (cm) : rea 0.432 0.188 12.017 Volume (cm) : 69.40% 87.00% -385.80% % Reduction in Area: 84.70% 95.70% -811.10% % Reduction in Volume: Grade 2 Grade 2 Full Thickness With Exposed Support Classification: Structures Medium Medium Medium Exudate A mount: Serosanguineous Serosanguineous  Purulent Exudate Type: red, brown red, brown yellow, brown, green Exudate Color: Distinct, outline attached N/A Distinct, outline attached Wound Margin: Large (67-100%) Large (67-100%) Large (67-100%) Granulation Amount: Red, Pink Red, Pink Red, Pink Granulation Quality: None Present (0%) None Present (0%) Small (1-33%) Necrotic Amount: N/A N/A Eschar, Adherent Slough Necrotic Tissue: Fascia: No Fascia: No Fascia: No Exposed Structures: Fat Layer (Subcutaneous Tissue): No Fat Layer (Subcutaneous Tissue): No Fat Layer (Subcutaneous Tissue): No Tendon: No Tendon: No Tendon: No Muscle: No Muscle: No Muscle: No Joint: No Joint: No Joint: No Bone: No Bone: No Bone: No Small (1-33%) None None Epithelialization: Compression Therapy Compression Therapy Compression Therapy Procedures Performed: Treatment Notes Electronic Signature(s) Signed: 06/22/2021 5:16:25 PM By: Linton Ham MD Signed: 06/22/2021 5:52:00 PM By: Deon Pilling RN, BSN Entered By: Linton Ham on 06/22/2021 10:37:51 -------------------------------------------------------------------------------- Multi-Disciplinary Care Plan Details Patient Name: Date of Service: [name]. 06/22/2021 9:45 A M Medical Record Number: [id number] Patient Account Number: [id number] Date of Birth/Sex: Treating RN: 1951/03/12 (71 y.o. [name] Primary Care Jon Lall: [name] Other Clinician: Referring Winefred Hillesheim: Treating Bruchy Mikel/Extender: Stormy Card in Treatment: 10 Multidisciplinary Care Plan reviewed with physician Active Inactive Soft Tissue Infection Nursing Diagnoses: Impaired tissue integrity Potential for infection: soft tissue Goals: Patient's soft tissue infection will resolve Date  Initiated: 04/20/2021 Target Resolution Date: 06/24/2021 Goal Status: Active Interventions: Assess signs and symptoms of infection every visit Provide education on  infection Treatment Activities: Culture : 04/20/2021 Education provided on Infection : 06/20/2021 Systemic antibiotics : 04/20/2021 T ordered outside of clinic : 04/20/2021 est Notes: Wound/Skin Impairment Nursing Diagnoses: Impaired tissue integrity Knowledge deficit related to ulceration/compromised skin integrity Goals: Patient will have a decrease in wound volume by X% from date: (specify in notes) Date Initiated: 04/12/2021 Date Inactivated: 04/27/2021 Target Resolution Date: 04/23/2021 Goal Status: Met Patient/caregiver will verbalize understanding of skin care regimen Date Initiated: 04/12/2021 Target Resolution Date: 06/24/2021 Goal Status: Active Ulcer/skin breakdown will have a volume reduction of 30% by week 4 Date Initiated: 04/12/2021 Date Inactivated: 04/27/2021 Target Resolution Date: 04/27/2021 Goal Status: Unmet Unmet Reason: infection Ulcer/skin breakdown will have a volume reduction of 50% by week 8 Date Initiated: 04/27/2021 Target Resolution Date: 06/24/2021 Goal Status: Active Interventions: Assess patient/caregiver ability to obtain necessary supplies Assess patient/caregiver ability to perform ulcer/skin care regimen upon admission and as needed Assess ulceration(s) every visit Notes: Electronic Signature(s) Signed: 06/22/2021 5:07:42 PM By: Rhae Hammock RN Entered By: Rhae Hammock on 06/22/2021 10:59:51 -------------------------------------------------------------------------------- Pain Assessment Details Patient Name: Date of Service: [name]. 06/22/2021 9:45 A M Medical Record Number: [id number] Patient Account Number: [id number] Date of Birth/Sex: Treating RN: [birth year] (71 y.o. [name], [name] Primary Care Deriana Vanderhoef: [name] Other Clinician: Referring Fraya Ueda: Treating Philomene Haff/Extender: Stormy Card in Treatment: 10 Active Problems Location of Pain Severity and Description of  Pain Patient Has Paino Yes Site Locations Pain Location: Generalized Pain, Pain in Ulcers With Dressing Change: Yes Duration of the Pain. Constant / Intermittento Constant Rate the pain. Current Pain Level: 4 Worst Pain Level: 10 Least Pain Level: 0 Tolerable Pain Level: 4 Character of Pain Describe the Pain: Aching Pain Management and Medication Current Pain Management: Medication: No Cold Application: No Rest: No Massage: No Activity: No T.E.N.S.: No Heat Application: No Leg drop or elevation: No Is the Current Pain Management Adequate: Adequate How does your wound impact your activities of daily livingo Sleep: No Bathing: No Appetite: No Relationship With Others: No Bladder Continence: No Emotions: No Bowel Continence: No Work: No Toileting: No Drive: No Dressing: No Hobbies: No Electronic Signature(s) Signed: 06/22/2021 5:07:42 PM By: Rhae Hammock RN Entered By: Rhae Hammock on 06/22/2021 10:06:08 -------------------------------------------------------------------------------- Patient/Caregiver Education Details Patient Name: Date of Service: [name] 12/29/2022andnbsp9:45 A M Medical Record Number: [id number] Patient Account Number: [id number] Date of Birth/Gender: Treating RN: 04-12-1951 (71 y.o. [name] Primary Care Physician: [name] Other Clinician: Referring Physician: Treating Physician/Extender: Stormy Card in Treatment: 10 Education Assessment Education Provided To: Patient Education Topics Provided Basic Hygiene: Methods: Explain/Verbal Responses: Reinforcements needed, State content correctly Electronic Signature(s) Signed: 06/22/2021 5:07:42 PM By: Rhae Hammock RN Entered By: Rhae Hammock on 06/22/2021 11:01:03 -------------------------------------------------------------------------------- Wound Assessment Details Patient Name: Date of Service: [name].  06/22/2021 9:45 A M Medical Record Number: [id number] Patient Account Number: [id number] Date of Birth/Sex: Treating RN: 1950/09/12 (71 y.o. [name] Primary Care Amaura Authier: [name] Other Clinician: Referring Ralston Venus: Treating Teaghan Melrose/Extender: Stormy Card in Treatment: 10 Wound Status Wound Number: 18 Primary Diabetic Wound/Ulcer of the Lower Extremity Etiology: Wound Location: Left, Plantar Metatarsal head first Wound Open Wounding Event: Gradually Appeared Status: Date Acquired: 08/23/2020 Comorbid Glaucoma, Sleep Apnea, Hypertension, Peripheral Arterial Disease, Weeks Of Treatment: 10 History: Peripheral Venous Disease,  Type II Diabetes, Gout, Osteoarthritis, Clustered Wound: No Neuropathy Photos Wound Measurements Length: (cm) 2.2 Width: (cm) 2.5 Depth: (cm) 0.1 Area: (cm) 4.32 Volume: (cm) 0.432 % Reduction in Area: 69.4% % Reduction in Volume: 84.7% Epithelialization: Small (1-33%) Tunneling: No Undermining: No Wound Description Classification: Grade 2 Wound Margin: Distinct, outline attached Exudate Amount: Medium Exudate Type: Serosanguineous Exudate Color: red, brown Foul Odor After Cleansing: No Slough/Fibrino No Wound Bed Granulation Amount: Large (67-100%) Exposed Structure Granulation Quality: Red, Pink Fascia Exposed: No Necrotic Amount: None Present (0%) Fat Layer (Subcutaneous Tissue) Exposed: No Tendon Exposed: No Muscle Exposed: No Joint Exposed: No Bone Exposed: No Treatment Notes Wound #18 (Metatarsal head first) Wound Laterality: Plantar, Left Cleanser Soap and Water Discharge Instruction: May shower and wash wound with dial antibacterial soap and water prior to dressing change. Wound Cleanser Discharge Instruction: Cleanse the wound with wound cleanser prior to applying a clean dressing using gauze sponges, not tissue or cotton balls. Peri-Wound Care Triamcinolone 15 (g) Discharge Instruction: Use  triamcinolone 15 (g) as directed Zinc Oxide Ointment 30g tube Discharge Instruction: Apply Zinc Oxide to periwound with each dressing change Sween Lotion (Moisturizing lotion) Discharge Instruction: Apply moisturizing lotion as directed Topical Primary Dressing Hydrofera Blue Classic Foam, 4x4 in Discharge Instruction: Moisten with saline prior to applying to wound bed Secondary Dressing ABD Pad, 5x9 Discharge Instruction: Apply over primary dressing as directed. Zetuvit Plus 4x8 in Discharge Instruction: Apply over primary dressing as directed. CarboFLEX Odor Control Dressing, 4x4 in Discharge Instruction: Apply over primary dressing as directed. Secured With Compression Wrap Unnaboot w/Calamine, 4x10 (in/yd) Discharge Instruction: Apply Unnaboot as directed. May also use Miliken CoFlex Calamine 2 layer compression system as alternative. Compression Stockings Add-Ons Electronic Signature(s) Signed: 06/22/2021 5:52:00 PM By: Deon Pilling RN, BSN Signed: 06/27/2021 8:21:58 AM By: Sandre Kitty Entered By: Sandre Kitty on 06/22/2021 10:12:02 -------------------------------------------------------------------------------- Wound Assessment Details Patient Name: Date of Service: [name]. 06/22/2021 9:45 A M Medical Record Number: [id number] Patient Account Number: [id number] Date of Birth/Sex: Treating RN: 07-06-1950 (71 y.o. [name], [name] Primary Care Mattalynn Crandle: [name] Other Clinician: Referring Yahsir Wickens: Treating Eltha Tingley/Extender: Stormy Card in Treatment: 10 Wound Status Wound Number: 19 Primary Diabetic Wound/Ulcer of the Lower Extremity Etiology: Wound Location: Left, Anterior Lower Leg Wound Open Wounding Event: Gradually Appeared Status: Date Acquired: 08/23/2020 Comorbid Glaucoma, Sleep Apnea, Hypertension, Peripheral Arterial Disease, Weeks Of Treatment: 10 History: Peripheral Venous Disease, Type II Diabetes, Gout,  Osteoarthritis, Clustered Wound: No Neuropathy Photos Wound Measurements Length: (cm) 2 Width: (cm) 1.2 Depth: (cm) 0.1 Area: (cm) 1.885 Volume: (cm) 0.188 % Reduction in Area: 87% % Reduction in Volume: 95.7% Epithelialization: None Tunneling: No Undermining: No Wound Description Classification: Grade 2 Exudate Amount: Medium Exudate Type: Serosanguineous Exudate Color: red, brown Wound Bed Granulation Amount: Large (67-100%) Exposed Structure Granulation Quality: Red, Pink Fascia Exposed: No Necrotic Amount: None Present (0%) Fat Layer (Subcutaneous Tissue) Exposed: No Tendon Exposed: No Muscle Exposed: No Joint Exposed: No Bone Exposed: No Treatment Notes Wound #19 (Lower Leg) Wound Laterality: Left, Anterior Cleanser Soap and Water Discharge Instruction: May shower and wash wound with dial antibacterial soap and water prior to dressing change. Wound Cleanser Discharge Instruction: Cleanse the wound with wound cleanser prior to applying a clean dressing using gauze sponges, not tissue or cotton balls. Peri-Wound Care Triamcinolone 15 (g) Discharge Instruction: Use triamcinolone 15 (g) as directed Zinc Oxide Ointment 30g tube Discharge Instruction: Apply Zinc Oxide to periwound with each dressing change  Sween Lotion (Moisturizing lotion) Discharge Instruction: Apply moisturizing lotion as directed Topical Primary Dressing Hydrofera Blue Classic Foam, 4x4 in Discharge Instruction: Moisten with saline prior to applying to wound bed Secondary Dressing ABD Pad, 5x9 Discharge Instruction: Apply over primary dressing as directed. Zetuvit Plus 4x8 in Discharge Instruction: Apply over primary dressing as directed. Secured With Compression Wrap Unnaboot w/Calamine, 4x10 (in/yd) Discharge Instruction: Apply Unnaboot as directed. May also use Miliken CoFlex Calamine 2 layer compression system as alternative. Compression Stockings Add-Ons Electronic  Signature(s) Signed: 06/22/2021 5:07:42 PM By: Rhae Hammock RN Signed: 06/27/2021 8:21:58 AM By: Sandre Kitty Entered By: Sandre Kitty on 06/22/2021 10:12:32 -------------------------------------------------------------------------------- Wound Assessment Details Patient Name: Date of Service: [name]. 06/22/2021 9:45 A M Medical Record Number: [id number] Patient Account Number: [id number] Date of Birth/Sex: Treating RN: [birth year] (71 y.o. [name], [name] Primary Care Foster Sonnier: [name] Other Clinician: Referring Chancy Claros: Treating Maika Kaczmarek/Extender: Stormy Card in Treatment: 10 Wound Status Wound Number: 22 Primary Cyst Etiology: Wound Location: Left, Lateral Lower Leg Wound Open Wounding Event: Bump Status: Date Acquired: 06/03/2021 Comorbid Glaucoma, Sleep Apnea, Hypertension, Peripheral Arterial Disease, Weeks Of Treatment: 2 History: Peripheral Venous Disease, Type II Diabetes, Gout, Osteoarthritis, Clustered Wound: No Neuropathy Photos Wound Measurements Length: (cm) 3.4 Width: (cm) 3 Depth: (cm) 1.5 Area: (cm) 8.011 Volume: (cm) 12.017 % Reduction in Area: -385.8% % Reduction in Volume: -811.1% Epithelialization: None Tunneling: No Undermining: No Wound Description Classification: Full Thickness With Exposed Support Structures Wound Margin: Distinct, outline attached Exudate Amount: Medium Exudate Type: Purulent Exudate Color: yellow, brown, green Foul Odor After Cleansing: No Slough/Fibrino Yes Wound Bed Granulation Amount: Large (67-100%) Exposed Structure Granulation Quality: Red, Pink Fascia Exposed: No Necrotic Amount: Small (1-33%) Fat Layer (Subcutaneous Tissue) Exposed: No Necrotic Quality: Eschar, Adherent Slough Tendon Exposed: No Muscle Exposed: No Joint Exposed: No Bone Exposed: No Treatment Notes Wound #22 (Lower Leg) Wound Laterality: Left, Lateral Cleanser Soap and  Water Discharge Instruction: May shower and wash wound with dial antibacterial soap and water prior to dressing change. Wound Cleanser Discharge Instruction: Cleanse the wound with wound cleanser prior to applying a clean dressing using gauze sponges, not tissue or cotton balls. Peri-Wound Care Triamcinolone 15 (g) Discharge Instruction: Use triamcinolone 15 (g) as directed Zinc Oxide Ointment 30g tube Discharge Instruction: Apply Zinc Oxide to periwound with each dressing change Sween Lotion (Moisturizing lotion) Discharge Instruction: Apply moisturizing lotion as directed Topical Primary Dressing Hydrofera Blue Classic Foam, 4x4 in Discharge Instruction: Moisten with saline prior to applying to wound bed Secondary Dressing ABD Pad, 5x9 Discharge Instruction: Apply over primary dressing as directed. Zetuvit Plus 4x8 in Discharge Instruction: Apply over primary dressing as directed. Secured With Compression Wrap Unnaboot w/Calamine, 4x10 (in/yd) Discharge Instruction: Apply Unnaboot as directed. May also use Miliken CoFlex Calamine 2 layer compression system as alternative. Compression Stockings Add-Ons Electronic Signature(s) Signed: 06/22/2021 5:07:42 PM By: Rhae Hammock RN Signed: 06/27/2021 8:21:58 AM By: Sandre Kitty Entered By: Sandre Kitty on 06/22/2021 10:14:54 -------------------------------------------------------------------------------- Vitals Details Patient Name: Date of Service: [name]. 06/22/2021 9:45 A M Medical Record Number: [id number] Patient Account Number: [id number] Date of Birth/Sex: Treating RN: 07/09/1950 (71 y.o. [name], [name] Primary Care Oral Hallgren: [name] Other Clinician: Referring Emillie Chasen: Treating Ruchy Wildrick/Extender: Stormy Card in Treatment: 10 Vital Signs Time Taken: 10:04 Temperature (F): 98.5 Height (in): 74 Pulse (bpm): 77 Weight (lbs): 238 Respiratory Rate (breaths/min):  17 Body Mass Index (BMI): 30.6 Blood Pressure (mmHg): 134/67  Capillary Blood Glucose (mg/dl): 264 Reference Range: 80 - 120 mg / dl Electronic Signature(s) Signed: 06/22/2021 5:07:42 PM By: Rhae Hammock RN Entered By: Rhae Hammock on 06/22/2021 10:05:10

## 2021-06-29 ENCOUNTER — Encounter (HOSPITAL_BASED_OUTPATIENT_CLINIC_OR_DEPARTMENT_OTHER): Payer: Medicare Other | Admitting: Internal Medicine

## 2021-06-29 ENCOUNTER — Other Ambulatory Visit: Payer: Self-pay

## 2021-06-29 DIAGNOSIS — E11621 Type 2 diabetes mellitus with foot ulcer: Secondary | ICD-10-CM | POA: Diagnosis not present

## 2021-06-29 NOTE — Progress Notes (Signed)
[name], [name] ([id number]) Visit Report for 06/29/2021 HPI Details Patient Name: Date of Service: COBI, [name] 06/29/2021 9:30 A M Medical Record Number: [id number] Patient Account Number: [id number] Date of Birth/Sex: Treating RN: 1950/08/28 (71 y.o. [name] Primary Care Provider: Jilda [name] Other Clinician: Referring Provider: Treating Provider/Extender: [name] in Treatment: 11 History of Present Illness HPI Description: 10/11/17; Mr. Dohrman is a 71 year old man who tells me that in 2015 he slipped down the latter traumatizing his left leg. He developed a wound in the same spot the area that we are currently looking at. He states this closed over for the most part although he always felt it was somewhat unstable. In 2016 he hit the same area with the door of his car had this reopened. He tells me that this is never really closed although sometimes an inflow it remains open on a constant basis. He has not been using any specific dressing to this except for topical antibiotics the nature of which were not really sure. His primary doctor did send him to see Dr. [name] of interventional cardiology. He underwent an angiogram on 08/06/17 and he underwent a PTA and directional atherectomy of the lesser distal SFA and popliteal arteries which resulted in brisk improvement in blood flow. It was noted that he had 2 vessel runoff through the anterior tibial and peroneal. He is also been to see vascular and interventional radiologist. He was not felt to have any significant superficial venous insufficiency. Presumably is not a candidate for any ablation. It was suggested he come here for wound care. The patient is a type II diabetic on insulin. He also has a history of venous insufficiency. ABIs on the left were noncompressible in our clinic 10/21/17; patient we admitted to the clinic last week. He has a fairly large chronic ulcer on the left lateral calf in the setting  of chronic venous insufficiency. We put Iodosorb on him after an aggressive debridement and 3 layer compression. He complained of pain in his ankle and itching with is skin in fact he scratched the area on the medial calf superiorly at the rim of our wraps and he has 2 small open areas in that location today which are new. I changed his primary dressing today to silver collagen. As noted he is already had revascularization and does not have any significant superficial venous insufficiency that would be amenable to ablation 10/28/17; patient admitted to the clinic 2 weeks ago. He has a smaller Wound. Scratch injury from last week revealed. There is large wound over the tibial area. This is smaller. Granulation looks healthy. No need for debridement. 11/04/17; the wound on the left lateral calf looks better. Improved dimensions. Surface of this looks better. We've been maintaining him and Kerlix Coban wraps. He finds this much more comfortable. Silver collagen dressing 11/11/17; left lateral Wound continues to look healthy be making progress. Using a #5 curet I removed removed nonviable skin from the surface of the wound and then necrotic debris from the wound surface. Surface of the wound continues to look healthy. He also has an open area on the left great toenail bed. We've been using topical antibiotics. 11/19/17; left anterior lateral wound continues to look healthy but it's not closed. He also had a small wound above this on the left leg Initially traumatic wounds in the setting of significant chronic venous insufficiency and stasis dermatitis 11/25/17; left anterior wounds superiorly is closed still a small wound inferiorly.  12/02/17; left anterior tibial area. Arrives today with adherent callus. Post debridement clearly not completely closed. Hydrofera Blue under 3 layer compression. 12/09/17; left anterior tibia. Circumferential eschar however the wound bed looks stable to improved. We've been using  Hydrofera Blue under 3 layer compression 12/17/17; left anterior tibia. Apparently this was felt to be closed however when the wrap was taken off there is a skin tear to reopen wounds in the same area we've been using Hydrofera Blue under 3 layer compression 12/23/17 left anterior tibia. Not close to close this week apparently the [facility] was stuck to this again. Still circumferential eschar requiring debridement. I put a contact layer on this this time under the Hydrofera Blue 12/31/17; left anterior tibia. Wound is better slight amount of hyper-granulation. Using Hydrofera Blue over Adaptic. 01/07/18; left anterior tibia. The wound had some surface eschar however after this was removed he has no open wound.he was already revascularized by Dr. [name] when he came to our clinic with atherectomy of the left SFA and popliteal artery. He was also sent to interventional radiology for venous reflux studies. He was not felt to have significant reflux but certainly has chronic venous changes of his skin with hemosiderin deposition around this area. He will definitely need to lubricate his skin and wear compression stocking and I've talked to him about this. READMISSION 05/26/2018 This is a now 72 year old man we cared for with traumatic wounds on his left anterior lower extremity. He had been previously revascularized during that admission by Dr. [name]. Apparently in follow-up Dr. [name] noted that he had deterioration in his arterial status. He underwent a stent placement in the distal left SFA on 04/22/2018. 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 He went back to the angiography suite on 04/30/2018 he underwent PTA and balloon angioplasty of the occluded left mid anterior tibial artery, thrombotic occlusion went from 100 to 0% which reconstitutes the posterior tibial artery. He had thrombectomy and aspiration of the peroneal artery. The stent placed in the distal SFA left SFA was still  occluded. 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 He has been recuperating at [facility] in [facility] after the most recent hospitalization. He is going home tomorrow. He tells me that 2 weeks ago he traumatized the tip of his left fifth toe. He came in urgently for our review of this. This was a history of before I noted that Dr. [name] had already noted dry gangrenous changes of the left fifth toe 06/09/2018; 2-week follow-up. I did contact Dr. [name] after his last appointment and he apparently saw 1 of Dr. Irven Shelling colleagues the next day. He does not follow-up with Dr. [name] himself until Thursday of this week. 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 Nevertheless there is no evidence of infection no drainage and no pain. He had a new area that this week when we were signing him in today on the left anterior mid tibia area, this is in close proximity to the previous wound we have dealt with in this clinic. 06/23/2018; 2-week follow-up. I did not receive a recent note from Dr. [name] to review today. Our office is trying to obtain this. He is  apparently not planning to do further vascular interventions and wondered about compression to try and help with the patient's chronic venous insufficiency. However we are also concerned about the arterial flow. He arrives in clinic today with a new area on the left third toe. The areas on the calf/anterior tibia are close to closing. The left fifth toe is still mummified using Betadine. -In reviewing things with the patient he has what sounds like claudication with mild to moderate amount of activity. 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 I  prescribed Levaquin over the phone while we attempted to arrange a plan of care. However the patient called yesterday to report he had low-grade fever and he came in today acutely. There is been a marked deterioration in the left third toe with spreading cellulitis up into the dorsal left foot. He was referred to the emergency room. Readmission: 06/29/2020 patient presents today for reevaluation here in our clinic he was previously treated by Dr. Dellia Nims at the latter part of 2019 in 2 the beginning of 2020. Subsequently we have not seen him since that time in the interim he did have evaluation with vein and vascular specialist specifically Dr. Anice Paganini who did perform quite extensive work for a left femoral to anterior tibial artery bypass. With that being said in the interim the patient has developed significant lymphedema and has wounds that he tells me have really never healed in regard to the incision site on the left leg. He also has multiple wounds on the feet for various reasons some of which is that he tends to pick at his feet. Fortunately there is no signs of active infection systemically at this time he does have some wounds that are little bit deeper but most are fairly superficial he seems to have good blood flow and overall everything appears to be healthy I see no bone exposed and no obvious signs of osteomyelitis. I do not know that he necessarily needs a x-ray at this point although that something we could consider depending on how things progress. The patient does have a history of lymphedema, diabetes, this is type II, chronic kidney disease stage III, hypertension, and history of peripheral vascular disease. 07/05/2020; patient admitted last week. Is a patient I remember from 2019 he had a spreading infection involving the left foot and we sent him to the hospital. 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 He subsequently had a left femoral  to anterior tibial bypass by Dr.Cain vein and vascular. He also has severe lymphedema with chronic skin changes related to that on the left leg. The most problematic area that was new today was on the left medial great toe. This was apparently a small area last week there was purulent drainage which our intake nurse cultured. Also areas on the left medial foot and heel left lateral foot. He has 2 areas on the left medial calf left lateral calf in the setting of the severe lymphedema. 07/13/2020 on evaluation today patient appears to be doing better in my opinion compared to his last visit. The good news is there is no signs of active infection systemically and locally I do not see any signs of infection either. He did have an x-ray which was negative that is great news he had a culture which showed MRSA but at the same time he is been on the doxycycline which has helped. I do think we  may want to extend this for 7 additional days 1/25; patient admitted to the clinic a few weeks ago. 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 We have been putting him under compression his edema control is a lot better but he is severe verricused skin on the left leg. He is really done quite well he still has an open area on the left medial calf and the left medial first metatarsal head. We have been using silver collagen on the leg silver alginate on the foot 07/27/2020 upon evaluation today patient appears to be doing decently well in regard to his wounds. He still has a lot of dry skin on the left leg. Some of this is starting to peel back and I think he may be able to have them out by removing some that today. Fortunately there is no signs of active infection at this time on the left leg although on the right leg he does appear to have swelling and erythema as well as some mild warmth to touch. This does have been concerned about the possibility of cellulitis although within  the differential diagnosis I do think that potentially a DVT has to be at least considered. We need to rule that out before proceeding would just call in the cellulitis. Especially since he is having pain in the posterior aspect of his calf muscle. 2/8; the patient had seen sparingly. He has severe skin changes of chronic lymphedema in the left leg thickened hyperkeratotic verrucous skin. He has an open wound on the medial part of the left first met head left mid tibia. He also has a rim of nonepithelialized skin in the anterior mid tibia. He brought in the AmLactin lotion that was been prescribed although I am not sure under compression and its utility. There concern about cellulitis on the right lower leg the last time he was here. He was put on on antibiotics. His DVT rule out was negative. The right leg looks fine he is using his stocking on this area 08/10/2020 upon evaluation today patient appears to be doing well with regard to his leg currently. He has been tolerating the dressing changes without complication. Fortunately there is no signs of active infection which is great news. Overall very pleased with where things stand. 2/22; the patient still has an area on the medial part of the left first met his head. This looks better than when I last saw this earlier this month he has a rim of epithelialization but still some surface debris. Mostly everything on the left leg is healed. There is still a vulnerable in the left mid tibia area. 08/30/2020 upon evaluation today patient appears to be doing much better in regard to his wounds on his foot. 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 Nonetheless I think we will address that today I Minna send in a prescription for him in that regard. Overall though there does not appear to be any signs of significant  worsening. 09/07/2020 on evaluation today patient's wounds over his left foot appear to be doing excellent. I do not see any signs of infection there is some callus buildup this can require debridement for certain but overall I feel like he is managing quite nicely. He still using the AmLactin cream which has been beneficial for him as well. 3/22; left  foot wound is closed. There is no open area here. He is using ammonium lactate lotion to the lower extremities to help exfoliate dry cracked skin. He has compression stockings from elastic therapy in [place]. The wound on the medial part of his left first met head is healed today. READMISSION 04/12/2021 Mr. Buehler is a patient we know fairly well he had a prolonged stay in clinic in 2019 with wounds on his left lateral and left anterior lower extremity in the setting of chronic venous insufficiency. More recently he was here earlier this year with predominantly an area on his left foot first metatarsal head plantar and he says the plantar foot broke down on its not long after we discharged him but he did not come back here. The last few months areas of broken down on his left anterior and again the left lateral lower extremity. The leg itself is very swollen chronically enlarged a lot of hyperkeratotic dry Berry Q skin in the left lower leg. His edema extends well into the thigh. He was seen by Dr. Donzetta Matters. He had ABIs on 03/02/2021 showing an ABI on the right of 1 with a TBI of 0.72 his ABI in the left at 1.09 TBI of 0.99. Monophasic and biphasic waveforms on the right. On the left monophasic waveforms were noted he went on to have an angiogram on 03/27/2021 this showed the aortic aortic and iliac segments were free of flow-limiting stenosis the left common femoral vein to evaluate the left femoral to anterior tibial artery bypass was unobstructed the bypass was patent without any areas of stenosis. We discharged the patient in bilateral juxta lite  stockings but very clearly that was not sufficient to control the swelling and maintain skin integrity. He is clearly going to need compression pumps. 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 This is fortunate because he is on his feet for long periods of time. 10/27; patient comes in with our intake nurse reporting copious amount of green drainage from the left anterior mid tibia the left dorsal foot and to a lesser extent the left medial mid tibia. We left the compression wrap on all week for the amount of edema in his left leg is quite a bit better. We use silver alginate as the primary dressing 11/3; edema control is good. Left anterior lower leg left medial lower leg and the plantar first metatarsal head. The left anterior lower leg required debridement. Deep tissue culture I did of this wound showed MRSA I put him on 10 days of doxycycline which she will start today. We have him in compression wraps. He has a security [name] and [employer] however he is retiring on November 15. We will need to then get him into a better offloading boot for the left foot perhaps a total contact cast 11/10; edema control is quite good. Left anterior and left medial lower leg wounds in the setting of chronic venous insufficiency and lymphedema. He also has a substantial area over the left plantar first metatarsal head. 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 He has significant hypergranulation on the left plantar foot wound. The patient is a diabetic but he does not have significant PAD 11/17; edema control is quite good. Left anterior and left medial lower leg wounds look better. The really concerning area remains the  area on the left plantar first metatarsal head. He has a rim of epithelialization. He has been using a surgical shoe The patient is now retired from a a  [employer] I have gone over with him the need to offload this area aggressively. Starting today with a forefoot off loader but . possibly a total contact cast. 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 Arrives with a very significant odor from I think all of the wounds on the left leg and the left foot. Because of this I did not put a total contact cast on him today but will could still consider this. His wife was having cataract surgery which is the reason he missed the appointment 12/6. I saw this man 5 days ago with a swelling below the popliteal fossa. I thought he actually might have a Baker's cyst however the DVT rule out study that we could arrange right away was negative the technician told me this was not a ruptured Baker's cyst. We attempted to get this aspirated by under ultrasound guidance in interventional radiology however all they did was an ultrasound however it shows an extensive fluid collection 62 x 8 x 9.4 in the left thigh and left calf. The patient states he thinks this started 8 days ago or so but he really is not complaining of any pain, fever or systemic symptoms. He has not ha 12/20; after some difficulty I managed to get the patient into see Dr. Donzetta Matters. Eventually he was taken into the hospital and had a drain put in the fluid collection below his left knee posteriorly extending into the posterior thigh. He still has the drain in place. Culture of this showed moderate staff aureus few Morganella and few Klebsiella he is now on doxycycline and ciprofloxacin as suggested by infectious disease he is on this for a month. The drain will remain in place until it stops draining 12/29; he comes in today with the 1 wound on his left leg and the area on the left plantar first met head significantly smaller. Both look healthy. He still has the drain in the left leg. He says  he has to change this daily. Follows up with Dr. Donzetta Matters on January 11. 06/29/2021; the wounds that I am following on the left leg and left first met head continued to be quite healthy. However the area where his inferior drain is in place had copious amounts of drainage which was green in color. The wound here is larger. Follows up with Dr. Gwenlyn Saran of vein and vascular his surgeon next week as well as infectious disease. He remains on ciprofloxacin and doxycycline. He is not complaining of excessive pain in either one of the drain areas . Electronic Signature(s) Signed: 06/29/2021 4:38:12 PM By: Linton Ham MD Entered By: Linton Ham on 06/29/2021 10:44:27 -------------------------------------------------------------------------------- Physical Exam Details Patient Name: Date of Service: [name]. 06/29/2021 9:30 A M Medical Record Number: [id number] Patient Account Number: [id number] Date of Birth/Sex: Treating RN: Jul 25, 1950 (71 y.o. [name] Primary Care Provider: Jilda [name] Other Clinician: Referring Provider: Treating Provider/Extender: [name] in Treatment: 11 Constitutional Sitting or standing Blood Pressure is within target range for patient.. Pulse regular and within target range for patient.Marland Kitchen Respirations regular, non-labored and within target range.. Temperature is normal and within the target range for the patient.Marland Kitchen Appears  in no distress. Musculoskeletal There is no tenderness in the left calf left popliteal fossa no crepitus no tenderness in the thigh towards the proximal drain site. Integumentary (Hair, Skin) No erythema. Notes Wound exam; left lateral leg wound is just about closed. His area on the medial leg is closed. His left first metatarsal head is measuring much smaller generally healthy granulation no debridement is required My major concern today was the left lower leg drain site. Notable that he had copious amounts of green  murky drainage on his dressing. The wound itself is a lot larger. We cultured the tunnel going superiorly here. There is no tenderness no crepitus clinically there did not seem to be a fluid collection although I wondered about this when I first saw the drainage Electronic Signature(s) Signed: 06/29/2021 4:38:12 PM By: Linton Ham MD Entered By: Linton Ham on 06/29/2021 10:46:33 -------------------------------------------------------------------------------- Physician Orders Details Patient Name: Date of Service: [name]. 06/29/2021 9:30 A M Medical Record Number: [id number] Patient Account Number: [id number] Date of Birth/Sex: Treating RN: [birth year] (71 y.o. [name], [name] Primary Care Provider: Jilda [name] Other Clinician: Referring Provider: Treating Provider/Extender: [name] in Treatment: 11 Verbal / Phone Orders: No Diagnosis Coding ICD-10 Coding Code Description E11.621 Type 2 diabetes mellitus with foot ulcer 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 I89.0 Lymphedema, not elsewhere classified I87.322 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verity 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 of other part of left foot with other specified severity E11.42 Type 2 diabetes mellitus with diabetic polyneuropathy L02.416 Cutaneous abscess of left lower limb Follow-up Appointments ppointment in 1 week. Dellia Nims on Thursday Return A Nurse Visit: - Tuesday Bathing/ Shower/ Hygiene May shower with protection but do not get wound dressing(s) wet. - Use a cast protector so you can shower without getting your wrap(s) wet Edema Control - Lymphedema / SCD / Other Elevate legs to the level of the heart or above for 30 minutes daily and/or when sitting, a frequency of: - throughout  the day Avoid standing for long periods of time. Patient to wear own compression stockings every day. - on right leg; Moisturize legs daily. - Ammonium LACTATE to BLE every day. Off-Loading Wedge shoe to: - left front foot offloader Wound Treatment Wound #18 - Metatarsal head first Wound Laterality: Plantar, Left Cleanser: Soap and Water 2 x Per Week/7 Days Discharge Instructions: May shower and wash wound with dial antibacterial soap and water prior to dressing change. Cleanser: Wound Cleanser 2 x Per Week/7 Days Discharge Instructions: Cleanse the wound with wound cleanser prior to applying a clean dressing using gauze sponges, not tissue or cotton balls. Peri-Wound Care: Triamcinolone 15 (g) 2 x Per Week/7 Days Discharge Instructions: Use triamcinolone 15 (g) as directed Peri-Wound Care: Zinc Oxide Ointment 30g tube 2 x Per Week/7 Days Discharge Instructions: Apply Zinc Oxide to periwound with each dressing change Peri-Wound Care: Sween Lotion (Moisturizing lotion) 2 x Per Week/7 Days Discharge Instructions: Apply moisturizing lotion as directed Prim Dressing: Hydrofera Blue Classic Foam, 4x4 in 2 x Per Week/7 Days ary Discharge Instructions: Moisten with saline prior to applying to wound bed Secondary Dressing: ABD Pad, 5x9 2 x Per Week/7 Days Discharge Instructions: Apply over primary dressing as directed. Secondary Dressing: Zetuvit Plus 4x8 in 2 x Per Week/7 Days Discharge Instructions: Apply over primary dressing as directed. Secondary  Dressing: CarboFLEX Odor Control Dressing, 4x4 in 2 x Per Week/7 Days Discharge Instructions: Apply over primary dressing as directed. Compression Wrap: CoFlex TLC XL 2-layer Compression System 4x7 (in/yd) 2 x Per Week/7 Days Discharge Instructions: Apply CoFlex 2-layer compression as directed. (alt for 4 layer) Wound #19 - Lower Leg Wound Laterality: Left, Anterior Cleanser: Soap and Water 2 x Per Week/7 Days Discharge Instructions: May shower  and wash wound with dial antibacterial soap and water prior to dressing change. Cleanser: Wound Cleanser 2 x Per Week/7 Days Discharge Instructions: Cleanse the wound with wound cleanser prior to applying a clean dressing using gauze sponges, not tissue or cotton balls. Peri-Wound Care: Triamcinolone 15 (g) 2 x Per Week/7 Days Discharge Instructions: Use triamcinolone 15 (g) as directed Peri-Wound Care: Zinc Oxide Ointment 30g tube 2 x Per Week/7 Days Discharge Instructions: Apply Zinc Oxide to periwound with each dressing change Peri-Wound Care: Sween Lotion (Moisturizing lotion) 2 x Per Week/7 Days Discharge Instructions: Apply moisturizing lotion as directed Prim Dressing: Hydrofera Blue Classic Foam, 4x4 in 2 x Per Week/7 Days ary Discharge Instructions: Moisten with saline prior to applying to wound bed Secondary Dressing: ABD Pad, 5x9 2 x Per Week/7 Days Discharge Instructions: Apply over primary dressing as directed. Secondary Dressing: Zetuvit Plus 4x8 in 2 x Per Week/7 Days Discharge Instructions: Apply over primary dressing as directed. Secondary Dressing: CarboFLEX Odor Control Dressing, 4x4 in 2 x Per Week/7 Days Discharge Instructions: Apply over primary dressing as directed. Compression Wrap: CoFlex TLC XL 2-layer Compression System 4x7 (in/yd) 2 x Per Week/7 Days Discharge Instructions: Apply CoFlex 2-layer compression as directed. (alt for 4 layer) Wound #22 - Lower Leg Wound Laterality: Left, Lateral Cleanser: Soap and Water 2 x Per Week/7 Days Discharge Instructions: May shower and wash wound with dial antibacterial soap and water prior to dressing change. Cleanser: Wound Cleanser 2 x Per Week/7 Days Discharge Instructions: Cleanse the wound with wound cleanser prior to applying a clean dressing using gauze sponges, not tissue or cotton balls. Peri-Wound Care: Triamcinolone 15 (g) 2 x Per Week/7 Days Discharge Instructions: Use triamcinolone 15 (g) as directed Peri-Wound  Care: Zinc Oxide Ointment 30g tube 2 x Per Week/7 Days Discharge Instructions: Apply Zinc Oxide to periwound with each dressing change Peri-Wound Care: Sween Lotion (Moisturizing lotion) 2 x Per Week/7 Days Discharge Instructions: Apply moisturizing lotion as directed Prim Dressing: Hydrofera Blue Classic Foam, 4x4 in 2 x Per Week/7 Days ary Discharge Instructions: Moisten with saline prior to applying to wound bed Secondary Dressing: ABD Pad, 5x9 2 x Per Week/7 Days Discharge Instructions: Apply over primary dressing as directed. Secondary Dressing: Zetuvit Plus 4x8 in 2 x Per Week/7 Days Discharge Instructions: Apply over primary dressing as directed. Secondary Dressing: CarboFLEX Odor Control Dressing, 4x4 in 2 x Per Week/7 Days Discharge Instructions: Apply over primary dressing as directed. Compression Wrap: CoFlex TLC XL 2-layer Compression System 4x7 (in/yd) 2 x Per Week/7 Days Discharge Instructions: Apply CoFlex 2-layer compression as directed. (alt for 4 layer) Laboratory erobe culture (MICRO) - Culture of left lateral wound deep tissue swab. - (ICD10 L97.828 - Non- Bacteria identified in Unspecified specimen by A pressure chronic ulcer of other part of left lower leg with other specified severity) LOINC Code: 947-6 Convenience Name: Areobic culture-specimen not specified Electronic Signature(s) Signed: 06/29/2021 4:38:12 PM By: Linton Ham MD Signed: 06/29/2021 5:33:22 PM By: Deon Pilling RN, BSN Entered By: Deon Pilling on 06/29/2021 10:36:26 -------------------------------------------------------------------------------- Problem List Details Patient Name: Date of Service:  ELICEO, GLADU. 06/29/2021 9:30 A M Medical Record Number: [id number] Patient Account Number: [id number] Date of Birth/Sex: Treating RN: 1950/10/08 (71 y.o. [name], [name] Primary Care Provider: Jilda [name] Other Clinician: Referring Provider: Treating Provider/Extender: [name] in Treatment: 11 Active Problems ICD-10 Encounter Code Description Active Date MDM Diagnosis E11.621 Type 2 diabetes mellitus with foot ulcer 04/12/2021 No Yes 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 04/12/2021 No Yes I89.0 Lymphedema, not elsewhere classified 04/12/2021 No Yes I87.322 Chronic venous hypertension (idiopathic) with inflammation of left lower 04/12/2021 No Yes extremity 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 ulcer of other part of left lower leg with other specified 04/12/2021 No Yes severity 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 of other part of left foot with other specified 04/12/2021 No Yes severity E11.42 Type 2 diabetes mellitus with diabetic polyneuropathy 04/12/2021 No Yes L02.416 Cutaneous abscess of left lower limb 06/13/2021 No Yes Inactive Problems Resolved Problems Electronic Signature(s) Signed: 06/29/2021 4:38:12 PM By: Linton Ham MD Entered By: Linton Ham on 06/29/2021 10:42:29 -------------------------------------------------------------------------------- Progress Note Details Patient Name: Date of Service: [name]. 06/29/2021 9:30 A M Medical Record Number: [id number] Patient Account Number: [id number] Date of Birth/Sex: Treating RN: [birth year] (70 y.o. [name] Primary Care Provider: Jilda [name] Other Clinician: Referring Provider: Treating Provider/Extender: [name] in Treatment: 11 Subjective History of Present Illness (HPI) 10/11/17; Mr. Hoaglin is a 71 year old man who tells me that in 2015 he slipped down the latter traumatizing his left leg. He developed a wound in the same spot the area that we are currently looking at. He states this closed over for the most part although he always felt it was somewhat unstable. In 2016 he hit the same area with the door of his car had this reopened. He tells me that this is never really closed although sometimes an  inflow it remains open on a constant basis. He has not been using any specific dressing to this except for topical antibiotics the nature of which were not really sure. His primary doctor did send him to see Dr. [name] of interventional cardiology. He underwent an angiogram on 08/06/17 and he underwent a PTA and directional atherectomy of the lesser distal SFA and popliteal arteries which resulted in brisk improvement in blood flow. It was noted that he had 2 vessel runoff through the anterior tibial and peroneal. He is also been to see vascular and interventional radiologist. He was not felt to have any significant superficial venous insufficiency. Presumably is not a candidate for any ablation. It was suggested he come here for wound care. The patient is a type II diabetic on insulin. He also has a history of venous insufficiency. ABIs on the left were noncompressible in our clinic 10/21/17; patient we admitted to the clinic last week. 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 We put Iodosorb on him after an aggressive debridement and 3 layer compression. He complained of pain in his ankle and itching with is skin in fact he scratched the area on the medial calf superiorly at the rim of our wraps and he has 2 small open areas in that location today which are new. I changed his primary dressing today to silver collagen. As noted he is already had revascularization and does not have any significant superficial venous insufficiency that would be amenable to ablation 10/28/17; patient admitted to the clinic 2  weeks ago. He has a smaller Wound. Scratch injury from last week revealed. There is large wound over the tibial area. This is smaller. Granulation looks healthy. No need for debridement. 11/04/17; the wound on the left lateral calf looks better. Improved dimensions. Surface of this looks better. We've been maintaining him and Kerlix  Coban wraps. He finds this much more comfortable. Silver collagen dressing 11/11/17; left lateral Wound continues to look healthy be making progress. Using a #5 curet I removed removed nonviable skin from the surface of the wound and then necrotic debris from the wound surface. Surface of the wound continues to look healthy. ooHe also has an open area on the left great toenail bed. We've been using topical antibiotics. 11/19/17; left anterior lateral wound continues to look healthy but it's not closed. ooHe also had a small wound above this on the left leg ooInitially traumatic wounds in the setting of significant chronic venous insufficiency and stasis dermatitis 11/25/17; left anterior wounds superiorly is closed still a small wound inferiorly. 12/02/17; left anterior tibial area. Arrives today with adherent callus. Post debridement clearly not completely closed. Hydrofera Blue under 3 layer compression. 12/09/17; left anterior tibia. Circumferential eschar however the wound bed looks stable to improved. We've been using Hydrofera Blue under 3 layer compression 12/17/17; left anterior tibia. Apparently this was felt to be closed however when the wrap was taken off there is a skin tear to reopen wounds in the same area we've been using Hydrofera Blue under 3 layer compression 12/23/17 left anterior tibia. Not close to close this week apparently the [facility] was stuck to this again. Still circumferential eschar requiring debridement. I put a contact layer on this this time under the Hydrofera Blue 12/31/17; left anterior tibia. Wound is better slight amount of hyper-granulation. Using Hydrofera Blue over Adaptic. 01/07/18; left anterior tibia. The wound had some surface eschar however after this was removed he has no open wound.he was already revascularized by Dr. [name] when he came to our clinic with atherectomy of the left SFA and popliteal artery. He was also sent to interventional radiology for  venous reflux studies. He was not felt to have significant reflux but certainly has chronic venous changes of his skin with hemosiderin deposition around this area. He will definitely need to lubricate his skin and wear compression stocking and I've talked to him about this. READMISSION 05/26/2018 This is a now 71 year old man we cared for with traumatic wounds on his left anterior lower extremity. He had been previously revascularized during that admission by Dr. [name]. Apparently in follow-up Dr. [name] noted that he had deterioration in his arterial status. He underwent a stent placement in the distal left SFA on 04/22/2018. 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 He went back to the angiography suite on 04/30/2018 he underwent PTA and balloon angioplasty of the occluded left mid anterior tibial artery, thrombotic occlusion went from 100 to 0% which reconstitutes the posterior tibial artery. He had thrombectomy and aspiration of the peroneal artery. The stent placed in the distal SFA left SFA was still occluded. 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 He has been recuperating at [place] home in [facility] after the most recent hospitalization. He is going  home tomorrow. He tells me that 2 weeks ago he traumatized the tip of his left fifth toe. He came in urgently for our review of this. This was a history of before I noted that Dr. [name] had already noted dry gangrenous changes of the left fifth toe 06/09/2018; 2-week follow-up. I did contact Dr. [name] after his last appointment and he apparently saw 1 of Dr. Irven Shelling colleagues the next day. He does not follow-up with Dr. [name] himself until Thursday of this week. 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 Nevertheless there is no evidence of infection no drainage and no pain. He had a new area that this week when we were signing him in today on the left anterior mid tibia area, this is in close proximity to the previous wound we have dealt with in this clinic. 06/23/2018; 2-week follow-up. I did not receive a recent note from Dr. [name] to review today. Our office is trying to obtain this. He is apparently not planning to do further vascular interventions and wondered about compression to try and help with the patient's chronic venous insufficiency. However we are also concerned about the arterial flow. ooHe arrives in clinic today with a new area on the left third toe. The areas on the calf/anterior tibia are close to closing. The left fifth toe is still mummified using Betadine. -In reviewing things with the patient he has what sounds like claudication with mild to moderate amount of activity. 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 I prescribed Levaquin over the phone while we attempted to arrange a plan of care. However the patient called yesterday to report he had low-grade fever and he came in today acutely. There is been a marked deterioration in the left third toe with spreading cellulitis up into the dorsal left foot. He was referred to the emergency room. Readmission: 06/29/2020 patient presents today for reevaluation here in our clinic he was previously treated by Dr. Dellia Nims at the latter part of 2019 in 2 the beginning of 2020. Subsequently we have not seen him since that time in the interim he did have evaluation with vein and vascular specialist specifically Dr. Anice Paganini who did perform quite extensive work for a left femoral to anterior tibial artery bypass. With that being said in the interim the patient has developed significant lymphedema and has wounds that he tells me have really never healed in  regard to the incision site on the left leg. He also has multiple wounds on the feet for various reasons some of which is that he tends to pick at his feet. Fortunately there is no signs of active infection systemically at this time he does have some wounds that are little bit deeper but most are fairly superficial he seems to have good blood flow and overall everything appears to be healthy I see no bone exposed and no obvious signs of osteomyelitis. I do not know that he necessarily needs a x-ray at this point although that something we could consider depending on how things progress. The patient does have a history of lymphedema, diabetes, this is type II, chronic kidney disease stage III, hypertension, and history of peripheral vascular disease. 07/05/2020; patient admitted last week. Is a patient I remember from 2019 he had a spreading infection involving the left foot and we sent him to the hospital. 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 He subsequently had a left femoral to anterior tibial bypass by Dr.Cain vein and vascular. He also has severe lymphedema with chronic skin changes related to that on the left leg. The most problematic area that was new today was on the left medial great toe. This was apparently a small area last week there was purulent drainage which our intake nurse cultured. Also areas on the left medial foot and heel left lateral foot. He has 2 areas on the left medial calf left lateral calf in the setting of the severe lymphedema. 07/13/2020 on evaluation today patient appears to be doing better in my opinion compared to his last visit. The good news is there is no signs of active infection systemically and locally I do not see any signs of infection either. He did have an x-ray which was negative that is great news he had a culture which showed MRSA but at the same time he is been on the doxycycline which has helped. I do think we  may want to extend this for 7 additional days 1/25; patient admitted to the clinic a few weeks ago. 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 We have been putting him under compression his edema control is a lot better but he is severe verricused skin on the left leg. He is really done quite well he still has an open area on the left medial calf and the left medial first metatarsal head. We have been using silver collagen on the leg silver alginate on the foot 07/27/2020 upon evaluation today patient appears to be doing decently well in regard to his wounds. He still has a lot of dry skin on the left leg. Some of this is starting to peel back and I think he may be able to have them out by removing some that today. Fortunately there is no signs of active infection at this time on the left leg although on the right leg he does appear to have swelling and erythema as well as some mild warmth to touch. This does have been concerned about the possibility of cellulitis although within the differential diagnosis I do think that potentially a DVT has to be at least considered. We need to rule that out before proceeding would just call in the cellulitis. Especially since he is having pain in the posterior aspect of his calf muscle. 2/8; the patient had seen sparingly. He has severe skin changes of chronic lymphedema in the left leg thickened hyperkeratotic verrucous skin. He has an open wound on the medial part of the left first met head left mid tibia. He also has a rim of nonepithelialized skin in the anterior mid tibia. He brought in the AmLactin lotion that was been prescribed although I am not sure under compression and its utility. There concern about cellulitis on the right lower leg the last time he was here. He was put on on antibiotics. His DVT rule out was negative. The right leg looks fine he is using his stocking on this area 08/10/2020 upon evaluation  today patient appears to be doing well with regard to his leg currently. He has been tolerating the dressing changes without complication. Fortunately there is no signs of active infection which is great news. Overall very pleased with where things stand. 2/22; the patient still has an area on the medial part of the left first met his head. This looks better than when I last saw this  earlier this month he has a rim of epithelialization but still some surface debris. Mostly everything on the left leg is healed. There is still a vulnerable in the left mid tibia area. 08/30/2020 upon evaluation today patient appears to be doing much better in regard to his wounds on his foot. 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 Nonetheless I think we will address that today I Minna send in a prescription for him in that regard. Overall though there does not appear to be any signs of significant worsening. 09/07/2020 on evaluation today patient's wounds over his left foot appear to be doing excellent. I do not see any signs of infection there is some callus buildup this can require debridement for certain but overall I feel like he is managing quite nicely. He still using the AmLactin cream which has been beneficial for him as well. 3/22; left foot wound is closed. There is no open area here. He is using ammonium lactate lotion to the lower extremities to help exfoliate dry cracked skin. He has compression stockings from elastic therapy in [place]. The wound on the medial part of his left first met head is healed today. READMISSION 04/12/2021 Mr. Shonk is a patient we know fairly well he had a prolonged stay in clinic in 2019 with wounds on his left lateral and left anterior lower extremity in the setting of chronic venous insufficiency. More recently he was here earlier this year  with predominantly an area on his left foot first metatarsal head plantar and he says the plantar foot broke down on its not long after we discharged him but he did not come back here. The last few months areas of broken down on his left anterior and again the left lateral lower extremity. The leg itself is very swollen chronically enlarged a lot of hyperkeratotic dry Berry Q skin in the left lower leg. His edema extends well into the thigh. He was seen by Dr. Donzetta Matters. He had ABIs on 03/02/2021 showing an ABI on the right of 1 with a TBI of 0.72 his ABI in the left at 1.09 TBI of 0.99. Monophasic and biphasic waveforms on the right. On the left monophasic waveforms were noted he went on to have an angiogram on 03/27/2021 this showed the aortic aortic and iliac segments were free of flow-limiting stenosis the left common femoral vein to evaluate the left femoral to anterior tibial artery bypass was unobstructed the bypass was patent without any areas of stenosis. We discharged the patient in bilateral juxta lite stockings but very clearly that was not sufficient to control the swelling and maintain skin integrity. He is clearly going to need compression pumps. 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 This is fortunate because he is on his feet for long periods of time. 10/27; patient comes in with our intake nurse reporting copious amount of green drainage from the left anterior mid tibia the left dorsal foot and to a lesser extent the left medial mid tibia. We left the compression wrap on all week for the amount of edema in his left leg is quite a bit better. We use silver alginate as the primary dressing 11/3; edema control is good. Left anterior lower leg left medial lower leg and the plantar first metatarsal  head. The left anterior lower leg required debridement. Deep tissue culture I did of this wound showed MRSA I put him on 10 days of doxycycline  which she will start today. We have him in compression wraps. He has a security [name] and [employer] however he is retiring on November 15. We will need to then get him into a better offloading boot for the left foot perhaps a total contact cast 11/10; edema control is quite good. Left anterior and left medial lower leg wounds in the setting of chronic venous insufficiency and lymphedema. He also has a substantial area over the left plantar first metatarsal head. 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 He has significant hypergranulation on the left plantar foot wound. The patient is a diabetic but he does not have significant PAD 11/17; edema control is quite good. Left anterior and left medial lower leg wounds look better. The really concerning area remains the area on the left plantar first metatarsal head. He has a rim of epithelialization. 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 Starting today with a forefoot off loader but . possibly a total contact cast. 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 Arrives with a very significant odor from I think all of the wounds on the left leg and the left foot. Because of this I did not put a total contact cast on him today but will could still consider this. His wife was having cataract surgery which is the reason he missed the appointment 12/6. I saw this man 5 days ago with a swelling below the popliteal fossa. I thought he actually might have a Baker's cyst however the DVT rule out study that we could arrange right away was negative the technician told me this was not a ruptured Baker's cyst. We attempted to get this aspirated by under  ultrasound guidance in interventional radiology however all they did was an ultrasound however it shows an extensive fluid collection 62 x 8 x 9.4 in the left thigh and left calf. The patient states he thinks this started 8 days ago or so but he really is not complaining of any pain, fever or systemic symptoms. He has not ha 12/20; after some difficulty I managed to get the patient into see Dr. Donzetta Matters. Eventually he was taken into the hospital and had a drain put in the fluid collection below his left knee posteriorly extending into the posterior thigh. He still has the drain in place. Culture of this showed moderate staff aureus few Morganella and few Klebsiella he is now on doxycycline and ciprofloxacin as suggested by infectious disease he is on this for a month. The drain will remain in place until it stops draining 12/29; he comes in today with the 1 wound on his left leg and the area on the left plantar first met head significantly smaller. Both look healthy. He still has the drain in the left leg. He says he has to change this daily. Follows up with Dr. Donzetta Matters on January 11. 06/29/2021; the wounds that I am following on the left leg and left first met head continued to be quite healthy.  However the area where his inferior drain is in place had copious amounts of drainage which was green in color. The wound here is larger. Follows up with Dr. Gwenlyn Saran of vein and vascular his surgeon next week as well as infectious disease. He remains on ciprofloxacin and doxycycline. He is not complaining of excessive pain in either one of the drain areas . Objective Constitutional Sitting or standing Blood Pressure is within target range for patient.. Pulse regular and within target range for patient.Marland Kitchen Respirations regular, non-labored and within target range.. Temperature is normal and within the target range for the patient.Marland Kitchen Appears in no distress. Vitals Time Taken: 9:50 AM, Height: 74 in, Weight: 238 lbs, BMI:  30.6, Temperature: 98.5 F, Pulse: 77 bpm, Respiratory Rate: 20 breaths/min, Blood Pressure: 138/82 mmHg, Capillary Blood Glucose: 142 mg/dl. Musculoskeletal There is no tenderness in the left calf left popliteal fossa no crepitus no tenderness in the thigh towards the proximal drain site. General Notes: Wound exam; left lateral leg wound is just about closed. His area on the medial leg is closed. His left first metatarsal head is measuring much smaller generally healthy granulation no debridement is required My major concern today was the left lower leg drain site. Notable that he had copious amounts of green murky drainage on his dressing. The wound itself is a lot larger. We cultured the tunnel going superiorly here. There is no tenderness no crepitus clinically there did not seem to be a fluid collection although I wondered about this when I first saw the drainage Integumentary (Hair, Skin) No erythema. Wound #18 status is Open. Original cause of wound was Gradually Appeared. The date acquired was: 08/23/2020. The wound has been in treatment 11 weeks. The wound is located on the Left,Plantar Metatarsal head first. The wound measures 1.9cm length x 2.5cm width x 0.1cm depth; 3.731cm^2 area and 0.373cm^3 volume. There is Fat Layer (Subcutaneous Tissue) exposed. There is no tunneling or undermining noted. There is a medium amount of serosanguineous drainage noted. The wound margin is distinct with the outline attached to the wound base. There is large (67-100%) red, pink granulation within the wound bed. There is no necrotic tissue within the wound bed. Wound #19 status is Open. Original cause of wound was Gradually Appeared. The date acquired was: 08/23/2020. The wound has been in treatment 11 weeks. The wound is located on the Left,Anterior Lower Leg. The wound measures 1cm length x 0.9cm width x 0.1cm depth; 0.707cm^2 area and 0.071cm^3 volume. There is Fat Layer (Subcutaneous Tissue) exposed.  There is no tunneling or undermining noted. There is a medium amount of serosanguineous drainage noted. The wound margin is distinct with the outline attached to the wound base. There is large (67-100%) red, pink granulation within the wound bed. There is no necrotic tissue within the wound bed. Wound #22 status is Open. Original cause of wound was Bump. The date acquired was: 06/03/2021. The wound has been in treatment 3 weeks. The wound is located on the Left,Lateral Lower Leg. The wound measures 3cm length x 2.5cm width x 1.5cm depth; 5.89cm^2 area and 8.836cm^3 volume. There is Fat Layer (Subcutaneous Tissue) exposed. There is no tunneling or undermining noted. There is a large amount of purulent drainage noted. The wound margin is thickened. There is medium (34-66%) red, pink granulation within the wound bed. There is a medium (34-66%) amount of necrotic tissue within the wound bed including Adherent Slough. General Notes: Penrose drain secured with suture to left lateral leg wound.  x1 staple fell out of wound. Provider made aware. 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 There was a Double Layer Compression Therapy Procedure by Deon Pilling, RN. Post procedure Diagnosis Wound #18: Same as Pre-Procedure Wound #19 Pre-procedure diagnosis of  Wound #19 is a Diabetic Wound/Ulcer of the Lower Extremity located on the Left,Anterior Lower Leg . There was a Double Layer Compression Therapy Procedure by Deon Pilling, RN. Post procedure Diagnosis Wound #19: Same as Pre-Procedure Wound #22 Pre-procedure diagnosis of Wound #22 is a Cyst located on the Left,Lateral Lower Leg . There was a Double Layer Compression Therapy Procedure by Deon Pilling, RN. Post procedure Diagnosis Wound #22: Same as Pre-Procedure Plan Follow-up Appointments: Return Appointment in 1 week. Dellia Nims on Thursday Nurse Visit: - Tuesday Bathing/ Shower/ Hygiene: May shower with protection but do not get wound dressing(s) wet. - Use a cast protector so you can shower without getting your wrap(s) wet Edema Control - Lymphedema / SCD / Other: Elevate legs to the level of the heart or above for 30 minutes daily and/or when sitting, a frequency of: - throughout the day Avoid standing for long periods of time. Patient to wear own compression stockings every day. - on right leg; Moisturize legs daily. - Ammonium LACTATE to BLE every day. Off-Loading: Wedge shoe to: - left front foot offloader Laboratory ordered were: Areobic culture-specimen not specified - Culture of left lateral wound deep tissue swab. WOUND #18: - Metatarsal head first Wound Laterality: Plantar, Left Cleanser: Soap and Water 2 x Per Week/7 Days Discharge Instructions: May shower and wash wound with dial antibacterial soap and water prior to dressing change. Cleanser: Wound Cleanser 2 x Per Week/7 Days Discharge Instructions: Cleanse the wound with wound cleanser prior to applying a clean dressing using gauze sponges, not tissue or cotton balls. Peri-Wound Care: Triamcinolone 15 (g) 2 x Per Week/7 Days Discharge Instructions: Use triamcinolone 15 (g) as directed Peri-Wound Care: Zinc Oxide Ointment 30g tube 2 x Per Week/7 Days Discharge Instructions: Apply Zinc Oxide to periwound with each dressing  change Peri-Wound Care: Sween Lotion (Moisturizing lotion) 2 x Per Week/7 Days Discharge Instructions: Apply moisturizing lotion as directed Prim Dressing: Hydrofera Blue Classic Foam, 4x4 in 2 x Per Week/7 Days ary Discharge Instructions: Moisten with saline prior to applying to wound bed Secondary Dressing: ABD Pad, 5x9 2 x Per Week/7 Days Discharge Instructions: Apply over primary dressing as directed. Secondary Dressing: Zetuvit Plus 4x8 in 2 x Per Week/7 Days Discharge Instructions: Apply over primary dressing as directed. Secondary Dressing: CarboFLEX Odor Control Dressing, 4x4 in 2 x Per Week/7 Days Discharge Instructions: Apply over primary dressing as directed. Com pression Wrap: CoFlex TLC XL 2-layer Compression System 4x7 (in/yd) 2 x Per Week/7 Days Discharge Instructions: Apply CoFlex 2-layer compression as directed. (alt for 4 layer) WOUND #19: - Lower Leg Wound Laterality: Left, Anterior Cleanser: Soap and Water 2 x Per Week/7 Days Discharge Instructions: May shower and wash wound  with dial antibacterial soap and water prior to dressing change. Cleanser: Wound Cleanser 2 x Per Week/7 Days Discharge Instructions: Cleanse the wound with wound cleanser prior to applying a clean dressing using gauze sponges, not tissue or cotton balls. Peri-Wound Care: Triamcinolone 15 (g) 2 x Per Week/7 Days Discharge Instructions: Use triamcinolone 15 (g) as directed Peri-Wound Care: Zinc Oxide Ointment 30g tube 2 x Per Week/7 Days Discharge Instructions: Apply Zinc Oxide to periwound with each dressing change Peri-Wound Care: Sween Lotion (Moisturizing lotion) 2 x Per Week/7 Days Discharge Instructions: Apply moisturizing lotion as directed Prim Dressing: Hydrofera Blue Classic Foam, 4x4 in 2 x Per Week/7 Days ary Discharge Instructions: Moisten with saline prior to applying to wound bed Secondary Dressing: ABD Pad, 5x9 2 x Per Week/7 Days Discharge Instructions: Apply over primary dressing  as directed. Secondary Dressing: Zetuvit Plus 4x8 in 2 x Per Week/7 Days Discharge Instructions: Apply over primary dressing as directed. Secondary Dressing: CarboFLEX Odor Control Dressing, 4x4 in 2 x Per Week/7 Days Discharge Instructions: Apply over primary dressing as directed. Com pression Wrap: CoFlex TLC XL 2-layer Compression System 4x7 (in/yd) 2 x Per Week/7 Days Discharge Instructions: Apply CoFlex 2-layer compression as directed. (alt for 4 layer) WOUND #22: - Lower Leg Wound Laterality: Left, Lateral Cleanser: Soap and Water 2 x Per Week/7 Days Discharge Instructions: May shower and wash wound with dial antibacterial soap and water prior to dressing change. Cleanser: Wound Cleanser 2 x Per Week/7 Days Discharge Instructions: Cleanse the wound with wound cleanser prior to applying a clean dressing using gauze sponges, not tissue or cotton balls. Peri-Wound Care: Triamcinolone 15 (g) 2 x Per Week/7 Days Discharge Instructions: Use triamcinolone 15 (g) as directed Peri-Wound Care: Zinc Oxide Ointment 30g tube 2 x Per Week/7 Days Discharge Instructions: Apply Zinc Oxide to periwound with each dressing change Peri-Wound Care: Sween Lotion (Moisturizing lotion) 2 x Per Week/7 Days Discharge Instructions: Apply moisturizing lotion as directed Prim Dressing: Hydrofera Blue Classic Foam, 4x4 in 2 x Per Week/7 Days ary Discharge Instructions: Moisten with saline prior to applying to wound bed Secondary Dressing: ABD Pad, 5x9 2 x Per Week/7 Days Discharge Instructions: Apply over primary dressing as directed. Secondary Dressing: Zetuvit Plus 4x8 in 2 x Per Week/7 Days Discharge Instructions: Apply over primary dressing as directed. Secondary Dressing: CarboFLEX Odor Control Dressing, 4x4 in 2 x Per Week/7 Days Discharge Instructions: Apply over primary dressing as directed. Com pression Wrap: CoFlex TLC XL 2-layer Compression System 4x7 (in/yd) 2 x Per Week/7 Days Discharge Instructions:  Apply CoFlex 2-layer compression as directed. (alt for 4 layer) 1. I did not change the dressings to the area we have been following which is Hydrofera Blue under compression. He has good edema control. Wounds continue to measure smaller 2. I am concerned about the drain site on the left posterior lateral calf I have cultured this area no addition to the antibiotics. I suspect when the drain is taken out we will have a fairly large wound in the calf to heal. 3. He sees Dr. Donzetta Matters next week I am interested in knowing whether he feels any additional imaging of these areas is required I did not order this todayo Abscess/drainable fluid collection. He also sees infectious disease Electronic Signature(s) Signed: 06/29/2021 4:38:12 PM By: Linton Ham MD Entered By: Linton Ham on 06/29/2021 10:48:46 -------------------------------------------------------------------------------- SuperBill Details Patient Name: Date of Service: [name]. 06/29/2021 Medical Record Number: [id number] Patient Account Number: [id number] Date of Birth/Sex: Treating  RN: [birth year] (71 y.o. [name], [name] Primary Care Provider: Jilda [name] Other Clinician: Referring Provider: Treating Provider/Extender: [name] in Treatment: 11 Diagnosis Coding ICD-10 Codes Code Description E11.621 Type 2 diabetes mellitus with foot ulcer 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 I89.0 Lymphedema, not elsewhere classified I87.322 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verity 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 of other part of left foot with other specified severity E11.42 Type 2 diabetes mellitus with diabetic polyneuropathy L02.416 Cutaneous abscess of left lower limb Facility Procedures CPT4 Code:  [id number] Description: (Facility Use Only) 29581LT - APPLY MULTLAY COMPRS LWR LT LEG Modifier: Quantity: 1 Physician Procedures : CPT4 Code Description Modifier [id number] - WC PHYS LEVEL 4 - EST PT ICD-10 Diagnosis Description 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 ulcer of other part of left  lower leg with other specified severity E11.621 Type 2 diabetes mellitus with foot ulcer L02.416 Cutaneous abscess of left lower limb Quantity: 1 Electronic Signature(s) Signed: 06/29/2021 4:38:12 PM By: Linton Ham MD Entered By: Linton Ham on 06/29/2021 10:49:20

## 2021-06-29 NOTE — Progress Notes (Signed)
[name], [name] ([id number]) Visit Report for 06/29/2021 Arrival Information Details Patient Name: Date of Service: [name], [name] 06/29/2021 9:30 A M Medical Record Number: [id number] Patient Account Number: [id number] Date of Birth/Sex: Treating RN: 11/03/1950 (71 y.o. [name], [name] Primary Care Cormick Moss: [name] Other Clinician: Referring Wayburn Shaler: Treating Sydna Brodowski/Extender: Stormy Card in Treatment: 11 Visit Information History Since Last Visit Added or deleted any medications: No Patient Arrived: Ambulatory Any new allergies or adverse reactions: No Arrival Time: 09:50 Had a fall or experienced change in No Accompanied By: self activities of daily living that may affect Transfer Assistance: None risk of falls: Patient Identification Verified: Yes Signs or symptoms of abuse/neglect since last visito No Secondary Verification Process Completed: Yes Hospitalized since last visit: No Patient Requires Transmission-Based Precautions: No Implantable device outside of the clinic excluding No Patient Has Alerts: Yes cellular tissue based products placed in the center Patient Alerts: ABI's: 09/22 L:1.09 R: 1. since last visit: TBI's: R:0.72 L:0.99 Has Dressing in Place as Prescribed: Yes Has Compression in Place as Prescribed: Yes Pain Present Now: No Electronic Signature(s) Signed: 06/29/2021 5:33:22 PM By: Deon Pilling RN, BSN Entered By: Deon Pilling on 06/29/2021 09:56:27 -------------------------------------------------------------------------------- Compression Therapy Details Patient Name: Date of Service: [name]. 06/29/2021 9:30 A M Medical Record Number: [id number] Patient Account Number: [id number] Date of Birth/Sex: Treating RN: [birth year] (71 y.o. [name] Primary Care Filicia Scogin: [name] Other Clinician: Referring Arlayne Liggins: Treating Garrie Woodin/Extender: Stormy Card in Treatment: 11 Compression  Therapy Performed for Wound Assessment: Wound #18 Left,Plantar Metatarsal head first Performed By: Clinician Deon Pilling, RN Compression Type: Double Layer Post Procedure Diagnosis Same as Pre-procedure Electronic Signature(s) Signed: 06/29/2021 5:33:22 PM By: Deon Pilling RN, BSN Entered By: Deon Pilling on 06/29/2021 10:20:37 -------------------------------------------------------------------------------- Compression Therapy Details Patient Name: Date of Service: [name]. 06/29/2021 9:30 A M Medical Record Number: [id number] Patient Account Number: [id number] Date of Birth/Sex: Treating RN: [birth year] (71 y.o. [name] Primary Care Joia Doyle: [name] Other Clinician: Referring Daysy Santini: Treating Haitham Dolinsky/Extender: Stormy Card in Treatment: 11 Compression Therapy Performed for Wound Assessment: Wound #22 Left,Lateral Lower Leg Performed By: Clinician Deon Pilling, RN Compression Type: Double Layer Post Procedure Diagnosis Same as Pre-procedure Electronic Signature(s) Signed: 06/29/2021 5:33:22 PM By: Deon Pilling RN, BSN Entered By: Deon Pilling on 06/29/2021 10:20:37 -------------------------------------------------------------------------------- Compression Therapy Details Patient Name: Date of Service: [name]. 06/29/2021 9:30 A M Medical Record Number: [id number] Patient Account Number: [id number] Date of Birth/Sex: Treating RN: Mar 20, 1951 (71 y.o. [name] Primary Care Christin Moline: [name] Other Clinician: Referring Ilka [name]: Treating Leeana Creer/Extender: Stormy Card in Treatment: 11 Compression Therapy Performed for Wound Assessment: Wound #19 Left,Anterior Lower Leg Performed By: Clinician Deon Pilling, RN Compression Type: Double Layer Post Procedure Diagnosis Same as Pre-procedure Electronic Signature(s) Signed: 06/29/2021 5:33:22 PM By: Deon Pilling RN, BSN Entered By: Deon Pilling on 06/29/2021 10:20:37 -------------------------------------------------------------------------------- Encounter Discharge Information Details Patient Name: Date of Service: [name]. 06/29/2021 9:30 A M Medical Record Number: [id number] Patient Account Number: [id number] Date of Birth/Sex: Treating RN: 1950/07/30 (71 y.o. [name] Primary Care Brianna Esson: [name] Other Clinician: Referring Bunnie Rehberg: Treating Lovella Hardie/Extender: Stormy Card in Treatment: 11 Encounter Discharge Information Items Discharge Condition: Stable Ambulatory Status: Ambulatory Discharge Destination: Home Transportation: Private Auto Accompanied By: self Schedule Follow-up Appointment: Yes Clinical Summary of Care: Electronic Signature(s) Signed: 06/29/2021 5:33:22 PM  By: Deon Pilling RN, BSN Entered By: Deon Pilling on 06/29/2021 10:21:23 -------------------------------------------------------------------------------- Lower Extremity Assessment Details Patient Name: Date of Service: [name], [name] 06/29/2021 9:30 A M Medical Record Number: [id number] Patient Account Number: [id number] Date of Birth/Sex: Treating RN: Jul 19, 1950 (71 y.o. [name] Primary Care Kacyn Souder: [name] Other Clinician: Referring Zyon Rosser: Treating Kamaya Keckler/Extender: Stormy Card in Treatment: 11 Edema Assessment Assessed: [name]: Yes] [name]: No] Edema: [Left: Ye] [Right: s] Calf Left: Right: Point of Measurement: 41 cm From Medial Instep 44 cm Ankle Left: Right: Point of Measurement: 10 cm From Medial Instep 27 cm Vascular Assessment Pulses: Dorsalis Pedis Palpable: [Left:Yes] Electronic Signature(s) Signed: 06/29/2021 5:33:22 PM By: Deon Pilling RN, BSN Entered By: Deon Pilling on 06/29/2021 10:00:46 -------------------------------------------------------------------------------- Multi Wound Chart Details Patient Name: Date of  Service: [name]. 06/29/2021 9:30 A M Medical Record Number: [id number] Patient Account Number: [id number] Date of Birth/Sex: Treating RN: 14-Nov-1950 (71 y.o. [name], [name] Primary Care Shelly Shoultz: [name] Other Clinician: Referring Trevell Pariseau: Treating Davyn Morandi/Extender: Stormy Card in Treatment: 11 Vital Signs Height(in): 27 Capillary Blood Glucose(mg/dl): 142 Weight(lbs): 238 Pulse(bpm): 25 Body Mass Index(BMI): 31 Blood Pressure(mmHg): 138/82 Temperature(F): 98.5 Respiratory Rate(breaths/min): 20 Photos: Left, Plantar Metatarsal head first Left, Anterior Lower Leg Left, Lateral Lower Leg Wound Location: Gradually Appeared Gradually Appeared Bump Wounding Event: Diabetic Wound/Ulcer of the Lower Diabetic Wound/Ulcer of the Lower Cyst Primary Etiology: Extremity Extremity Glaucoma, Sleep Apnea, Glaucoma, Sleep Apnea, Glaucoma, Sleep Apnea, Comorbid History: Hypertension, Peripheral Arterial Hypertension, Peripheral Arterial Hypertension, Peripheral Arterial Disease, Peripheral Venous Disease, Disease, Peripheral Venous Disease, Disease, Peripheral Venous Disease, Type II Diabetes, Gout, Osteoarthritis, Type II Diabetes, Gout, Osteoarthritis, Type II Diabetes, Gout, Osteoarthritis, Neuropathy Neuropathy Neuropathy 08/23/2020 08/23/2020 06/03/2021 Date Acquired: 11 11 3  Weeks of Treatment: Open Open Open Wound Status: 1.9x2.5x0.1 1x0.9x0.1 3x2.5x1.5 Measurements L x W x D (cm) 3.731 0.707 5.89 A (cm) : rea 0.373 0.071 8.836 Volume (cm) : 73.60% 95.10% -257.20% % Reduction in Area: 86.80% 98.40% -569.90% % Reduction in Volume: Grade 2 Grade 2 Full Thickness With Exposed Support Classification: Structures Medium Medium Large Exudate Amount: Serosanguineous Serosanguineous Purulent Exudate Type: red, brown red, brown yellow, brown, green Exudate Color: Distinct, outline attached Distinct, outline attached Thickened Wound  Margin: Large (67-100%) Large (67-100%) Medium (34-66%) Granulation Amount: Red, Pink Red, Pink Red, Pink Granulation Quality: None Present (0%) None Present (0%) Medium (34-66%) Necrotic Amount: Fat Layer (Subcutaneous Tissue): Yes Fat Layer (Subcutaneous Tissue): Yes Fat Layer (Subcutaneous Tissue): Yes Exposed Structures: Fascia: No Fascia: No Fascia: No Tendon: No Tendon: No Tendon: No Muscle: No Muscle: No Muscle: No Joint: No Joint: No Joint: No Bone: No Bone: No Bone: No Medium (34-66%) Large (67-100%) None Epithelialization: N/A N/A Penrose drain secured with suture to Assessment Notes: left lateral leg wound. x1 staple fell out of wound. Charlyn Vialpando made aware. Compression Therapy Compression Therapy Compression Therapy Procedures Performed: Treatment Notes Wound #18 (Metatarsal head first) Wound Laterality: Plantar, Left Cleanser Soap and Water Discharge Instruction: May shower and wash wound with dial antibacterial soap and water prior to dressing change. Wound Cleanser Discharge Instruction: Cleanse the wound with wound cleanser prior to applying a clean dressing using gauze sponges, not tissue or cotton balls. Peri-Wound Care Triamcinolone 15 (g) Discharge Instruction: Use triamcinolone 15 (g) as directed Zinc Oxide Ointment 30g tube Discharge Instruction: Apply Zinc Oxide to periwound with each dressing change Sween Lotion (Moisturizing lotion) Discharge Instruction: Apply moisturizing lotion as directed Topical  Primary Dressing Hydrofera Blue Classic Foam, 4x4 in Discharge Instruction: Moisten with saline prior to applying to wound bed Secondary Dressing ABD Pad, 5x9 Discharge Instruction: Apply over primary dressing as directed. Zetuvit Plus 4x8 in Discharge Instruction: Apply over primary dressing as directed. CarboFLEX Odor Control Dressing, 4x4 in Discharge Instruction: Apply over primary dressing as directed. Secured With Compression  Wrap CoFlex TLC XL 2-layer Compression System 4x7 (in/yd) Discharge Instruction: Apply CoFlex 2-layer compression as directed. (alt for 4 layer) Compression Stockings Add-Ons Wound #19 (Lower Leg) Wound Laterality: Left, Anterior Cleanser Soap and Water Discharge Instruction: May shower and wash wound with dial antibacterial soap and water prior to dressing change. Wound Cleanser Discharge Instruction: Cleanse the wound with wound cleanser prior to applying a clean dressing using gauze sponges, not tissue or cotton balls. Peri-Wound Care Triamcinolone 15 (g) Discharge Instruction: Use triamcinolone 15 (g) as directed Zinc Oxide Ointment 30g tube Discharge Instruction: Apply Zinc Oxide to periwound with each dressing change Sween Lotion (Moisturizing lotion) Discharge Instruction: Apply moisturizing lotion as directed Topical Primary Dressing Hydrofera Blue Classic Foam, 4x4 in Discharge Instruction: Moisten with saline prior to applying to wound bed Secondary Dressing ABD Pad, 5x9 Discharge Instruction: Apply over primary dressing as directed. Zetuvit Plus 4x8 in Discharge Instruction: Apply over primary dressing as directed. CarboFLEX Odor Control Dressing, 4x4 in Discharge Instruction: Apply over primary dressing as directed. Secured With Compression Wrap CoFlex TLC XL 2-layer Compression System 4x7 (in/yd) Discharge Instruction: Apply CoFlex 2-layer compression as directed. (alt for 4 layer) Compression Stockings Add-Ons Wound #22 (Lower Leg) Wound Laterality: Left, Lateral Cleanser Soap and Water Discharge Instruction: May shower and wash wound with dial antibacterial soap and water prior to dressing change. Wound Cleanser Discharge Instruction: Cleanse the wound with wound cleanser prior to applying a clean dressing using gauze sponges, not tissue or cotton balls. Peri-Wound Care Triamcinolone 15 (g) Discharge Instruction: Use triamcinolone 15 (g) as directed Zinc  Oxide Ointment 30g tube Discharge Instruction: Apply Zinc Oxide to periwound with each dressing change Sween Lotion (Moisturizing lotion) Discharge Instruction: Apply moisturizing lotion as directed Topical Primary Dressing Hydrofera Blue Classic Foam, 4x4 in Discharge Instruction: Moisten with saline prior to applying to wound bed Secondary Dressing ABD Pad, 5x9 Discharge Instruction: Apply over primary dressing as directed. Zetuvit Plus 4x8 in Discharge Instruction: Apply over primary dressing as directed. CarboFLEX Odor Control Dressing, 4x4 in Discharge Instruction: Apply over primary dressing as directed. Secured With Compression Wrap CoFlex TLC XL 2-layer Compression System 4x7 (in/yd) Discharge Instruction: Apply CoFlex 2-layer compression as directed. (alt for 4 layer) Compression Stockings Add-Ons Electronic Signature(s) Signed: 06/29/2021 4:38:12 PM By: Linton Ham MD Signed: 06/29/2021 5:33:22 PM By: Deon Pilling RN, BSN Entered By: Linton Ham on 06/29/2021 10:42:45 -------------------------------------------------------------------------------- Boyd Details Patient Name: Date of Service: [name]. 06/29/2021 9:30 A M Medical Record Number: [id number] Patient Account Number: [id number] Date of Birth/Sex: Treating RN: January 17, 1951 (71 y.o. [name] Primary Care Yalitza Teed: [name] Other Clinician: Referring Zakara Parkey: Treating Belton Peplinski/Extender: Stormy Card in Treatment: 11 Multidisciplinary Care Plan reviewed with physician Active Inactive Soft Tissue Infection Nursing Diagnoses: Impaired tissue integrity Potential for infection: soft tissue Goals: Patient's soft tissue infection will resolve Date Initiated: 04/20/2021 Target Resolution Date: 07/21/2021 Goal Status: Active Interventions: Assess signs and symptoms of infection every visit Provide education on infection Treatment  Activities: Culture : 04/20/2021 Education provided on Infection : 06/15/2021 Systemic antibiotics : 04/20/2021 T ordered outside of clinic :  04/20/2021 est Notes: Wound/Skin Impairment Nursing Diagnoses: Impaired tissue integrity Knowledge deficit related to ulceration/compromised skin integrity Goals: Patient will have a decrease in wound volume by X% from date: (specify in notes) Date Initiated: 04/12/2021 Date Inactivated: 04/27/2021 Target Resolution Date: 04/23/2021 Goal Status: Met Patient/caregiver will verbalize understanding of skin care regimen Date Initiated: 04/12/2021 Target Resolution Date: 07/21/2021 Goal Status: Active Ulcer/skin breakdown will have a volume reduction of 30% by week 4 Date Initiated: 04/12/2021 Date Inactivated: 04/27/2021 Target Resolution Date: 04/27/2021 Goal Status: Unmet Unmet Reason: infection Ulcer/skin breakdown will have a volume reduction of 50% by week 8 Date Initiated: 04/27/2021 Date Inactivated: 06/29/2021 Target Resolution Date: 06/24/2021 Goal Status: Met Interventions: Assess patient/caregiver ability to obtain necessary supplies Assess patient/caregiver ability to perform ulcer/skin care regimen upon admission and as needed Assess ulceration(s) every visit Notes: Electronic Signature(s) Signed: 06/29/2021 5:33:22 PM By: Deon Pilling RN, BSN Entered By: Deon Pilling on 06/29/2021 10:10:40 -------------------------------------------------------------------------------- Pain Assessment Details Patient Name: Date of Service: [name]. 06/29/2021 9:30 A M Medical Record Number: [id number] Patient Account Number: [id number] Date of Birth/Sex: Treating RN: January 20, 1951 (71 y.o. [name] Primary Care Nicoli Nardozzi: [name] Other Clinician: Referring Joplin Canty: Treating Jahson Emanuele/Extender: Stormy Card in Treatment: 11 Active Problems Location of Pain Severity and Description of Pain Patient Has  Paino No Site Locations Rate the pain. Current Pain Level: 0 Pain Management and Medication Current Pain Management: Medication: No Cold Application: No Rest: No Massage: No Activity: No T.E.N.S.: No Heat Application: No Leg drop or elevation: No Is the Current Pain Management Adequate: Adequate How does your wound impact your activities of daily livingo Sleep: No Bathing: No Appetite: No Relationship With Others: No Bladder Continence: No Emotions: No Bowel Continence: No Work: No Toileting: No Drive: No Dressing: No Hobbies: No Engineer, maintenance) Signed: 06/29/2021 5:33:22 PM By: Deon Pilling RN, BSN Entered By: Deon Pilling on 06/29/2021 09:57:59 -------------------------------------------------------------------------------- Patient/Caregiver Education Details Patient Name: Date of Service: [name] 1/5/2023andnbsp9:30 A M Medical Record Number: [id number] Patient Account Number: [id number] Date of Birth/Gender: Treating RN: [birth year] (71 y.o. [name] Primary Care Physician: [name] Other Clinician: Referring Physician: Treating Physician/Extender: Stormy Card in Treatment: 11 Education Assessment Education Provided To: Patient Education Topics Provided Wound/Skin Impairment: Handouts: Skin Care Do's and Dont's Methods: Explain/Verbal Responses: Reinforcements needed Electronic Signature(s) Signed: 06/29/2021 5:33:22 PM By: Deon Pilling RN, BSN Entered By: Deon Pilling on 06/29/2021 10:10:55 -------------------------------------------------------------------------------- Wound Assessment Details Patient Name: Date of Service: [name]. 06/29/2021 9:30 A M Medical Record Number: [id number] Patient Account Number: [id number] Date of Birth/Sex: Treating RN: 01/05/1951 (71 y.o. [name], [name] Primary Care Harlyn Rathmann: [name] Other Clinician: Referring Lizet Kelso: Treating Kahle Mcqueen/Extender:  Stormy Card in Treatment: 11 Wound Status Wound Number: 18 Primary Diabetic Wound/Ulcer of the Lower Extremity Etiology: Wound Location: Left, Plantar Metatarsal head first Wound Open Wounding Event: Gradually Appeared Status: Date Acquired: 08/23/2020 Comorbid Glaucoma, Sleep Apnea, Hypertension, Peripheral Arterial Disease, Weeks Of Treatment: 11 History: Peripheral Venous Disease, Type II Diabetes, Gout, Osteoarthritis, Clustered Wound: No Clustered Wound: No Neuropathy Photos Wound Measurements Length: (cm) 1.9 Width: (cm) 2.5 Depth: (cm) 0.1 Area: (cm) 3.731 Volume: (cm) 0.373 % Reduction in Area: 73.6% % Reduction in Volume: 86.8% Epithelialization: Medium (34-66%) Tunneling: No Undermining: No Wound Description Classification: Grade 2 Wound Margin: Distinct, outline attached Exudate Amount: Medium Exudate Type: Serosanguineous Exudate Color: red, brown Foul Odor After Cleansing: No Slough/Fibrino  No Wound Bed Granulation Amount: Large (67-100%) Exposed Structure Granulation Quality: Red, Pink Fascia Exposed: No Necrotic Amount: None Present (0%) Fat Layer (Subcutaneous Tissue) Exposed: Yes Tendon Exposed: No Muscle Exposed: No Joint Exposed: No Bone Exposed: No Treatment Notes Wound #18 (Metatarsal head first) Wound Laterality: Plantar, Left Cleanser Soap and Water Discharge Instruction: May shower and wash wound with dial antibacterial soap and water prior to dressing change. Wound Cleanser Discharge Instruction: Cleanse the wound with wound cleanser prior to applying a clean dressing using gauze sponges, not tissue or cotton balls. Peri-Wound Care Triamcinolone 15 (g) Discharge Instruction: Use triamcinolone 15 (g) as directed Zinc Oxide Ointment 30g tube Discharge Instruction: Apply Zinc Oxide to periwound with each dressing change Sween Lotion (Moisturizing lotion) Discharge Instruction: Apply moisturizing lotion as  directed Topical Primary Dressing Hydrofera Blue Classic Foam, 4x4 in Discharge Instruction: Moisten with saline prior to applying to wound bed Secondary Dressing ABD Pad, 5x9 Discharge Instruction: Apply over primary dressing as directed. Zetuvit Plus 4x8 in Discharge Instruction: Apply over primary dressing as directed. CarboFLEX Odor Control Dressing, 4x4 in Discharge Instruction: Apply over primary dressing as directed. Secured With Compression Wrap CoFlex TLC XL 2-layer Compression System 4x7 (in/yd) Discharge Instruction: Apply CoFlex 2-layer compression as directed. (alt for 4 layer) Compression Stockings Add-Ons Electronic Signature(s) Signed: 06/29/2021 5:33:22 PM By: Deon Pilling RN, BSN Entered By: Deon Pilling on 06/29/2021 10:11:19 -------------------------------------------------------------------------------- Wound Assessment Details Patient Name: Date of Service: [name]. 06/29/2021 9:30 A M Medical Record Number: [id number] Patient Account Number: [id number] Date of Birth/Sex: Treating RN: August 01, 1950 (71 y.o. [name], [name] Primary Care Corion Sherrod: [name] Other Clinician: Referring Mayuri Staples: Treating Miniya Miguez/Extender: Stormy Card in Treatment: 11 Wound Status Wound Number: 19 Primary Diabetic Wound/Ulcer of the Lower Extremity Etiology: Wound Location: Left, Anterior Lower Leg Wound Open Wounding Event: Gradually Appeared Status: Date Acquired: 08/23/2020 Comorbid Glaucoma, Sleep Apnea, Hypertension, Peripheral Arterial Disease, Weeks Of Treatment: 11 History: Peripheral Venous Disease, Type II Diabetes, Gout, Osteoarthritis, Clustered Wound: No Neuropathy Photos Wound Measurements Length: (cm) 1 Width: (cm) 0.9 Depth: (cm) 0.1 Area: (cm) 0.707 Volume: (cm) 0.071 % Reduction in Area: 95.1% % Reduction in Volume: 98.4% Epithelialization: Large (67-100%) Tunneling: No Undermining: No Wound  Description Classification: Grade 2 Wound Margin: Distinct, outline attached Exudate Amount: Medium Exudate Type: Serosanguineous Exudate Color: red, brown Foul Odor After Cleansing: No Slough/Fibrino No Wound Bed Granulation Amount: Large (67-100%) Exposed Structure Granulation Quality: Red, Pink Fascia Exposed: No Necrotic Amount: None Present (0%) Fat Layer (Subcutaneous Tissue) Exposed: Yes Tendon Exposed: No Muscle Exposed: No Joint Exposed: No Bone Exposed: No Treatment Notes Wound #19 (Lower Leg) Wound Laterality: Left, Anterior Cleanser Soap and Water Discharge Instruction: May shower and wash wound with dial antibacterial soap and water prior to dressing change. Wound Cleanser Discharge Instruction: Cleanse the wound with wound cleanser prior to applying a clean dressing using gauze sponges, not tissue or cotton balls. Peri-Wound Care Triamcinolone 15 (g) Discharge Instruction: Use triamcinolone 15 (g) as directed Zinc Oxide Ointment 30g tube Discharge Instruction: Apply Zinc Oxide to periwound with each dressing change Sween Lotion (Moisturizing lotion) Discharge Instruction: Apply moisturizing lotion as directed Topical Primary Dressing Hydrofera Blue Classic Foam, 4x4 in Discharge Instruction: Moisten with saline prior to applying to wound bed Secondary Dressing ABD Pad, 5x9 Discharge Instruction: Apply over primary dressing as directed. Zetuvit Plus 4x8 in Discharge Instruction: Apply over primary dressing as directed. CarboFLEX Odor Control Dressing, 4x4 in Discharge Instruction: Apply over  primary dressing as directed. Secured With Compression Wrap CoFlex TLC XL 2-layer Compression System 4x7 (in/yd) Discharge Instruction: Apply CoFlex 2-layer compression as directed. (alt for 4 layer) Compression Stockings Add-Ons Electronic Signature(s) Signed: 06/29/2021 5:33:22 PM By: Deon Pilling RN, BSN Entered By: Deon Pilling on 06/29/2021  10:11:47 -------------------------------------------------------------------------------- Wound Assessment Details Patient Name: Date of Service: [name]. 06/29/2021 9:30 A M Medical Record Number: [id number] Patient Account Number: [id number] Date of Birth/Sex: Treating RN: 09-08-1950 (71 y.o. [name], [name] Primary Care Chabeli Barsamian: [name] Other Clinician: Referring Neng Albee: Treating Katrece Roediger/Extender: Stormy Card in Treatment: 11 Wound Status Wound Number: 22 Primary Cyst Etiology: Wound Location: Left, Lateral Lower Leg Wound Open Wounding Event: Bump Status: Date Acquired: 06/03/2021 Comorbid Glaucoma, Sleep Apnea, Hypertension, Peripheral Arterial Disease, Weeks Of Treatment: 3 History: Peripheral Venous Disease, Type II Diabetes, Gout, Osteoarthritis, Clustered Wound: No Neuropathy Photos Wound Measurements Length: (cm) 3 Width: (cm) 2.5 Depth: (cm) 1.5 Area: (cm) 5.89 Volume: (cm) 8.836 % Reduction in Area: -257.2% % Reduction in Volume: -569.9% Epithelialization: None Tunneling: No Undermining: No Wound Description Classification: Full Thickness With Exposed Support Structures Wound Margin: Thickened Exudate Amount: Large Exudate Type: Purulent Exudate Color: yellow, brown, green Foul Odor After Cleansing: No Slough/Fibrino Yes Wound Bed Granulation Amount: Medium (34-66%) Exposed Structure Granulation Quality: Red, Pink Fascia Exposed: No Necrotic Amount: Medium (34-66%) Fat Layer (Subcutaneous Tissue) Exposed: Yes Necrotic Quality: Adherent Slough Tendon Exposed: No Muscle Exposed: No Joint Exposed: No Bone Exposed: No Assessment Notes Penrose drain secured with suture to left lateral leg wound. x1 staple fell out of wound. Lujean Ebright made aware. Treatment Notes Wound #22 (Lower Leg) Wound Laterality: Left, Lateral Cleanser Soap and Water Discharge Instruction: May shower and wash wound with dial  antibacterial soap and water prior to dressing change. Wound Cleanser Discharge Instruction: Cleanse the wound with wound cleanser prior to applying a clean dressing using gauze sponges, not tissue or cotton balls. Peri-Wound Care Triamcinolone 15 (g) Discharge Instruction: Use triamcinolone 15 (g) as directed Zinc Oxide Ointment 30g tube Discharge Instruction: Apply Zinc Oxide to periwound with each dressing change Sween Lotion (Moisturizing lotion) Discharge Instruction: Apply moisturizing lotion as directed Topical Primary Dressing Hydrofera Blue Classic Foam, 4x4 in Discharge Instruction: Moisten with saline prior to applying to wound bed Secondary Dressing ABD Pad, 5x9 Discharge Instruction: Apply over primary dressing as directed. Zetuvit Plus 4x8 in Discharge Instruction: Apply over primary dressing as directed. CarboFLEX Odor Control Dressing, 4x4 in Discharge Instruction: Apply over primary dressing as directed. Secured With Compression Wrap CoFlex TLC XL 2-layer Compression System 4x7 (in/yd) Discharge Instruction: Apply CoFlex 2-layer compression as directed. (alt for 4 layer) Compression Stockings Add-Ons Electronic Signature(s) Signed: 06/29/2021 5:33:22 PM By: Deon Pilling RN, BSN Entered By: Deon Pilling on 06/29/2021 10:12:16 -------------------------------------------------------------------------------- Vitals Details Patient Name: Date of Service: [name]. 06/29/2021 9:30 A M Medical Record Number: [id number] Patient Account Number: [id number] Date of Birth/Sex: Treating RN: May 09, 1951 (71 y.o. [name], [name] Primary Care Talli Kimmer: [name] Other Clinician: Referring Jasier Calabretta: Treating Josaiah Muhammed/Extender: Stormy Card in Treatment: 11 Vital Signs Time Taken: 09:50 Temperature (F): 98.5 Height (in): 74 Pulse (bpm): 77 Weight (lbs): 238 Respiratory Rate (breaths/min): 20 Body Mass Index (BMI): 30.6 Blood Pressure  (mmHg): 138/82 Capillary Blood Glucose (mg/dl): 142 Reference Range: 80 - 120 mg / dl Electronic Signature(s) Signed: 06/29/2021 5:33:22 PM By: Deon Pilling RN, BSN Entered By: Deon Pilling on 06/29/2021 09:57:47

## 2021-07-03 ENCOUNTER — Other Ambulatory Visit: Payer: Self-pay

## 2021-07-03 ENCOUNTER — Encounter (HOSPITAL_BASED_OUTPATIENT_CLINIC_OR_DEPARTMENT_OTHER): Payer: Medicare Other | Admitting: Internal Medicine

## 2021-07-03 DIAGNOSIS — E11621 Type 2 diabetes mellitus with foot ulcer: Secondary | ICD-10-CM | POA: Diagnosis not present

## 2021-07-03 NOTE — Progress Notes (Signed)
[name], [name] ([id number]) Visit Report for 07/03/2021 SuperBill Details Patient Name: Date of Service: [name], [name] 07/03/2021 Medical Record Number: [id number] Patient Account Number: [id number] Date of Birth/Sex: Treating RN: 1950/12/02 (71 y.o. 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

## 2021-07-04 LAB — AEROBIC/ANAEROBIC CULTURE W GRAM STAIN (SURGICAL/DEEP WOUND)

## 2021-07-04 NOTE — Progress Notes (Signed)
[name], [name] ([id number]) Visit Report for 07/03/2021 Arrival Information Details Patient Name: Date of Service: [name], [name] 07/03/2021 9:15 A M Medical Record Number: [id number] Patient Account Number: [id number] Date of Birth/Sex: Treating RN: [birth year] (71 y.o. [name] Primary Care Nykia Turko: [name] Other Clinician: Referring Zyaira Vejar: Treating Damaree Sargent/Extender: Cornelious Bryant in Treatment: 11 Visit Information History Since Last Visit Added or deleted any medications: No Patient Arrived: Ambulatory Any new allergies or adverse reactions: No Arrival Time: 09:17 Had a fall or experienced change in No Accompanied By: self activities of daily living that may affect Transfer Assistance: None risk of falls: Patient Identification Verified: Yes Signs or symptoms of abuse/neglect since last visito No Secondary Verification Process Completed: Yes Hospitalized since last visit: No Patient Requires Transmission-Based Precautions: No Implantable device outside of the clinic excluding No Patient Has Alerts: Yes cellular tissue based products placed in the center Patient Alerts: ABI's: 09/22 L:1.09 R: 1. since last visit: TBI's: R:0.72 L:0.99 Has Dressing in Place as Prescribed: Yes Pain Present Now: No Electronic Signature(s) Signed: 07/04/2021 3:01:20 PM By: Sandre Kitty Entered By: Sandre Kitty on 07/03/2021 09:18:24 -------------------------------------------------------------------------------- Compression Therapy Details Patient Name: Date of Service: [name]. 07/03/2021 9:15 A M Medical Record Number: [id number] Patient Account Number: [id number] Date of Birth/Sex: Treating RN: 1950-10-03 (71 y.o. [name] Primary Care Elani Delph: [name] Other Clinician: Referring Annisha Baar: Treating Joseh Sjogren/Extender: Cornelious Bryant in Treatment: 11 Compression Therapy Performed for Wound Assessment:  Wound #18 Left,Plantar Metatarsal head first Performed By: Clinician Deon Pilling, RN Compression Type: Double Layer Electronic Signature(s) Signed: 07/03/2021 4:36:05 PM By: Deon Pilling RN, BSN Entered By: Deon Pilling on 07/03/2021 09:51:28 -------------------------------------------------------------------------------- Compression Therapy Details Patient Name: Date of Service: [name]. 07/03/2021 9:15 A M Medical Record Number: [id number] Patient Account Number: [id number] Date of Birth/Sex: Treating RN: 06-Oct-1950 (71 y.o. [name] Primary Care Graham Hyun: Other Clinician: [name] Referring Markitta Ausburn: Treating Cassanda Walmer/Extender: Cornelious Bryant in Treatment: 11 Compression Therapy Performed for Wound Assessment: Wound #19 Left,Anterior Lower Leg Performed By: Clinician Deon Pilling, RN Compression Type: Double Layer Electronic Signature(s) Signed: 07/03/2021 4:36:05 PM By: Deon Pilling RN, BSN Entered By: Deon Pilling on 07/03/2021 09:51:28 -------------------------------------------------------------------------------- Compression Therapy Details Patient Name: Date of Service: [name]. 07/03/2021 9:15 A M Medical Record Number: [id number] Patient Account Number: [id number] Date of Birth/Sex: Treating RN: 12/19/1950 (71 y.o. [name] Primary Care Montine Hight: [name] Other Clinician: Referring Danie Hannig: Treating Opha Mcghee/Extender: Cornelious Bryant in Treatment: 11 Compression Therapy Performed for Wound Assessment: Wound #22 Left,Lateral Lower Leg Performed By: Clinician Deon Pilling, RN Compression Type: Double Layer Electronic Signature(s) Signed: 07/03/2021 4:36:05 PM By: Deon Pilling RN, BSN Entered By: Deon Pilling on 07/03/2021 09:51:28 -------------------------------------------------------------------------------- Encounter Discharge Information Details Patient Name: Date of  Service: [name]. 07/03/2021 9:15 A M Medical Record Number: [id number] Patient Account Number: [id number] Date of Birth/Sex: Treating RN: [birth year] (71 y.o. [name] Primary Care Maryori Weide: [name] Other Clinician: Referring Tanicia Wolaver: Treating Jaquann Guarisco/Extender: Cornelious Bryant in Treatment: 11 Encounter Discharge Information Items Discharge Condition: Stable Ambulatory Status: Ambulatory Discharge Destination: Home Transportation: Private Auto Accompanied By: self Schedule Follow-up Appointment: Yes Clinical Summary of Care: Electronic Signature(s) Signed: 07/03/2021 4:36:05 PM By: Deon Pilling RN, BSN Entered By: Deon Pilling on 07/03/2021 09:52:03 -------------------------------------------------------------------------------- Patient/Caregiver Education Details Patient Name: Date of Service: [name]. 1/9/2023andnbsp9:15 A  M Medical Record Number: [id number] Patient Account Number: [id number] Date of Birth/Gender: Treating RN: 05-12-1951 (71 y.o. [name] Primary Care Physician: [name] Other Clinician: Referring Physician: Treating Physician/Extender: Cornelious Bryant in Treatment: 11 Education Assessment Education Provided To: Patient Education Topics Provided Infection: Handouts: Infection Prevention and Management Methods: Explain/Verbal Responses: Reinforcements needed Electronic Signature(s) Signed: 07/03/2021 4:36:05 PM By: Deon Pilling RN, BSN Entered By: Deon Pilling on 07/03/2021 09:51:53 -------------------------------------------------------------------------------- Wound Assessment Details Patient Name: Date of Service: [name]. 07/03/2021 9:15 A M Medical Record Number: [id number] Patient Account Number: [id number] Date of Birth/Sex: Treating RN: 09-09-1950 (71 y.o. [name] Primary Care Chika Cichowski: [name] Other Clinician: Referring  Remigio Mcmillon: Treating Tavonte Seybold/Extender: Cornelious Bryant in Treatment: 11 Wound Status Wound Number: 18 Primary Etiology: Diabetic Wound/Ulcer of the Lower Extremity Wound Location: Left, Plantar Metatarsal head first Wound Status: Open Wounding Event: Gradually Appeared Date Acquired: 08/23/2020 Weeks Of Treatment: 11 Clustered Wound: No Wound Measurements Length: (cm) 1.9 Width: (cm) 2.5 Depth: (cm) 0.1 Area: (cm) 3.731 Volume: (cm) 0.373 % Reduction in Area: 73.6% % Reduction in Volume: 86.8% Wound Description Classification: Grade 2 Exudate Amount: Medium Exudate Type: Serosanguineous Exudate Color: red, brown Treatment Notes Wound #18 (Metatarsal head first) Wound Laterality: Plantar, Left Cleanser Soap and Water Discharge Instruction: May shower and wash wound with dial antibacterial soap and water prior to dressing change. Wound Cleanser Discharge Instruction: Cleanse the wound with wound cleanser prior to applying a clean dressing using gauze sponges, not tissue or cotton balls. Peri-Wound Care Triamcinolone 15 (g) Discharge Instruction: Use triamcinolone 15 (g) as directed Zinc Oxide Ointment 30g tube Discharge Instruction: Apply Zinc Oxide to periwound with each dressing change Sween Lotion (Moisturizing lotion) Discharge Instruction: Apply moisturizing lotion as directed Topical Primary Dressing Hydrofera Blue Classic Foam, 4x4 in Discharge Instruction: Moisten with saline prior to applying to wound bed Secondary Dressing ABD Pad, 5x9 Discharge Instruction: Apply over primary dressing as directed. Zetuvit Plus 4x8 in Discharge Instruction: Apply over primary dressing as directed. CarboFLEX Odor Control Dressing, 4x4 in Discharge Instruction: Apply over primary dressing as directed. Secured With Compression Wrap CoFlex TLC XL 2-layer Compression System 4x7 (in/yd) Discharge Instruction: Apply CoFlex 2-layer compression as directed.  (alt for 4 layer) Compression Stockings Add-Ons Electronic Signature(s) Signed: 07/03/2021 4:35:23 PM By: Lorrin Jackson Signed: 07/04/2021 3:01:20 PM By: Sandre Kitty Entered By: Sandre Kitty on 07/03/2021 09:22:32 -------------------------------------------------------------------------------- Wound Assessment Details Patient Name: Date of Service: [name]. 07/03/2021 9:15 A M Medical Record Number: [id number] Patient Account Number: [id number] Date of Birth/Sex: Treating RN: 17-Apr-1951 (71 y.o. [name] Primary Care Ferron Ishmael: [name] Other Clinician: Referring Isebella Upshur: Treating Dahiana Kulak/Extender: Cornelious Bryant in Treatment: 11 Wound Status Wound Number: 19 Primary Etiology: Diabetic Wound/Ulcer of the Lower Extremity Wound Location: Left, Anterior Lower Leg Wound Status: Open Wounding Event: Gradually Appeared Date Acquired: 08/23/2020 Weeks Of Treatment: 11 Clustered Wound: No Wound Measurements Length: (cm) 1 Width: (cm) 0.9 Depth: (cm) 0.1 Area: (cm) 0.707 Volume: (cm) 0.071 % Reduction in Area: 95.1% % Reduction in Volume: 98.4% Wound Description Classification: Grade 2 Exudate Amount: Medium Exudate Type: Serosanguineous Exudate Color: red, brown Treatment Notes Wound #19 (Lower Leg) Wound Laterality: Left, Anterior Cleanser Soap and Water Discharge Instruction: May shower and wash wound with dial antibacterial soap and water prior to dressing change. Wound Cleanser Discharge Instruction: Cleanse the wound with wound cleanser prior to applying a clean dressing using gauze  sponges, not tissue or cotton balls. Peri-Wound Care Triamcinolone 15 (g) Discharge Instruction: Use triamcinolone 15 (g) as directed Zinc Oxide Ointment 30g tube Discharge Instruction: Apply Zinc Oxide to periwound with each dressing change Sween Lotion (Moisturizing lotion) Discharge Instruction: Apply moisturizing lotion as  directed Topical Primary Dressing Hydrofera Blue Classic Foam, 4x4 in Discharge Instruction: Moisten with saline prior to applying to wound bed Secondary Dressing ABD Pad, 5x9 Discharge Instruction: Apply over primary dressing as directed. Zetuvit Plus 4x8 in Discharge Instruction: Apply over primary dressing as directed. CarboFLEX Odor Control Dressing, 4x4 in Discharge Instruction: Apply over primary dressing as directed. Secured With Compression Wrap CoFlex TLC XL 2-layer Compression System 4x7 (in/yd) Discharge Instruction: Apply CoFlex 2-layer compression as directed. (alt for 4 layer) Compression Stockings Add-Ons Electronic Signature(s) Signed: 07/03/2021 4:35:23 PM By: Lorrin Jackson Signed: 07/04/2021 3:01:20 PM By: Sandre Kitty Entered By: Sandre Kitty on 07/03/2021 09:22:32 -------------------------------------------------------------------------------- Wound Assessment Details Patient Name: Date of Service: [name]. 07/03/2021 9:15 A M Medical Record Number: [id number] Patient Account Number: [id number] Date of Birth/Sex: Treating RN: 1950/08/03 (71 y.o. [name] Primary Care Anjenette Gerbino: [name] Other Clinician: Referring Rosaland Shiffman: Treating Siddh Vandeventer/Extender: Cornelious Bryant in Treatment: 11 Wound Status Wound Number: 22 Primary Etiology: Cyst Wound Location: Left, Lateral Lower Leg Wound Status: Open Wounding Event: Bump Date Acquired: 06/03/2021 Weeks Of Treatment: 4 Clustered Wound: No Wound Measurements Length: (cm) 3 Width: (cm) 2.5 Depth: (cm) 1.5 Area: (cm) 5.89 Volume: (cm) 8.836 % Reduction in Area: -257.2% % Reduction in Volume: -569.9% Wound Description Classification: Full Thickness With Exposed Support Structure Exudate Amount: Large Exudate Type: Purulent Exudate Color: yellow, brown, green s Treatment Notes Wound #22 (Lower Leg) Wound Laterality: Left, Lateral Cleanser Soap and  Water Discharge Instruction: May shower and wash wound with dial antibacterial soap and water prior to dressing change. Wound Cleanser Discharge Instruction: Cleanse the wound with wound cleanser prior to applying a clean dressing using gauze sponges, not tissue or cotton balls. Peri-Wound Care Triamcinolone 15 (g) Discharge Instruction: Use triamcinolone 15 (g) as directed Zinc Oxide Ointment 30g tube Discharge Instruction: Apply Zinc Oxide to periwound with each dressing change Sween Lotion (Moisturizing lotion) Discharge Instruction: Apply moisturizing lotion as directed Topical Primary Dressing Hydrofera Blue Classic Foam, 4x4 in Discharge Instruction: Moisten with saline prior to applying to wound bed Secondary Dressing ABD Pad, 5x9 Discharge Instruction: Apply over primary dressing as directed. Zetuvit Plus 4x8 in Discharge Instruction: Apply over primary dressing as directed. CarboFLEX Odor Control Dressing, 4x4 in Discharge Instruction: Apply over primary dressing as directed. Secured With Compression Wrap CoFlex TLC XL 2-layer Compression System 4x7 (in/yd) Discharge Instruction: Apply CoFlex 2-layer compression as directed. (alt for 4 layer) Compression Stockings Add-Ons Electronic Signature(s) Signed: 07/03/2021 4:35:23 PM By: Lorrin Jackson Signed: 07/04/2021 3:01:20 PM By: Sandre Kitty Entered By: Sandre Kitty on 07/03/2021 09:22:32 -------------------------------------------------------------------------------- Vitals Details Patient Name: Date of Service: [name]. 07/03/2021 9:15 A M Medical Record Number: [id number] Patient Account Number: [id number] Date of Birth/Sex: Treating RN: Jan 19, 1951 (71 y.o. [name] Primary Care Luma Clopper: [name] Other Clinician: Referring Rozalyn Osland: Treating Jeanie Mccard/Extender: Cornelious Bryant in Treatment: 11 Vital Signs Time Taken: 09:18 Temperature (F): 98.3 Height (in):  74 Pulse (bpm): 66 Weight (lbs): 238 Respiratory Rate (breaths/min): 20 Body Mass Index (BMI): 30.6 Blood Pressure (mmHg): 154/91 Capillary Blood Glucose (mg/dl): 167 Reference Range: 80 - 120 mg / dl Electronic Signature(s) Signed: 07/04/2021 3:01:20 PM By: Rudean Curt,  Destiny Entered By: Sandre Kitty on 07/03/2021 09:19:38

## 2021-07-05 ENCOUNTER — Other Ambulatory Visit: Payer: Self-pay

## 2021-07-05 ENCOUNTER — Inpatient Hospital Stay: Payer: BC Managed Care – PPO | Admitting: Internal Medicine

## 2021-07-05 ENCOUNTER — Ambulatory Visit (INDEPENDENT_AMBULATORY_CARE_PROVIDER_SITE_OTHER): Payer: Medicare Other | Admitting: Internal Medicine

## 2021-07-05 ENCOUNTER — Encounter: Payer: Self-pay | Admitting: Internal Medicine

## 2021-07-05 ENCOUNTER — Ambulatory Visit (INDEPENDENT_AMBULATORY_CARE_PROVIDER_SITE_OTHER): Payer: BC Managed Care – PPO | Admitting: Physician Assistant

## 2021-07-05 VITALS — BP 149/90 | HR 64 | Temp 97.1°F | Resp 20 | Ht 74.0 in | Wt 258.8 lb

## 2021-07-05 VITALS — Wt 259.4 lb

## 2021-07-05 DIAGNOSIS — E1169 Type 2 diabetes mellitus with other specified complication: Secondary | ICD-10-CM

## 2021-07-05 DIAGNOSIS — T8189XD Other complications of procedures, not elsewhere classified, subsequent encounter: Secondary | ICD-10-CM

## 2021-07-05 DIAGNOSIS — Z9889 Other specified postprocedural states: Secondary | ICD-10-CM

## 2021-07-05 DIAGNOSIS — I739 Peripheral vascular disease, unspecified: Secondary | ICD-10-CM

## 2021-07-05 DIAGNOSIS — A4901 Methicillin susceptible Staphylococcus aureus infection, unspecified site: Secondary | ICD-10-CM | POA: Diagnosis present

## 2021-07-05 DIAGNOSIS — T8149XA Infection following a procedure, other surgical site, initial encounter: Secondary | ICD-10-CM

## 2021-07-05 DIAGNOSIS — E669 Obesity, unspecified: Secondary | ICD-10-CM

## 2021-07-05 MED ORDER — MUPIROCIN 2 % EX OINT: 1.0000 "application " | TOPICAL_OINTMENT | Freq: Two times a day (BID) | CUTANEOUS | 1 refills | Status: DC

## 2021-07-05 MED ORDER — CHLORHEXIDINE GLUCONATE 4 % EX LIQD: Freq: Every day | CUTANEOUS | 1 refills | Status: DC

## 2021-07-05 NOTE — Progress Notes (Signed)
° °  Subjective:    Patient ID: [name], male    DOB: 1951/01/21, 71 y.o.   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  Culture with polymicrobial growth including MRSA, GBS, Morgenella morganii and Klebsiella and treated with appropriate antibiotics with doxycycline and ciprofloxacin for 4 weeks through 07/03/21.  He saw vascular surgery this am and picture of the wound reviewed.  No pus, no redness.  Staples removed on the upper portion.    Review of Systems  Constitutional:  Negative for chills, fatigue and fever.  Gastrointestinal:  Negative for diarrhea and nausea.  Skin:  Positive for wound. Negative for rash.      Objective:   Physical Exam Eyes:     General: No scleral icterus. Pulmonary:     Effort: Pulmonary effort is normal.  Skin:    Findings: No rash.  Neurological:     General: No focal deficit present.     Mental Status: He is alert.  Psychiatric:        Mood and Affect: Mood normal.   SH: no current tobacco       Assessment & Plan:

## 2021-07-05 NOTE — Patient Instructions (Signed)
[facility] Five day Treatment Plan for MRSA (Staph) Decolonization  Please let us know if you have questions or concerns or do not understand the information we give you.  Prepare Chose a period when you will be uninterrupted by going away or other distractions. To ensure that your skin is in good condition, follow the Routine Skin Care principles below to reduce drying and enhance healing. Do not start while you have any active boils. Routine Skin Care principles to reduce drying and enhance healing: Avoid the use of soap when bathing or showering when performing this decolonization. DO NOT routinely use antiseptic solutions. If you need to use something, chose a soap substitute (examples -QV Wash or Cetaphil).  When drying with a towel, be gentle and pat dry your skin. Avoid rubbing the skin.  Reduce the overall frequency of bathing or showering. A short shower (3 minutes) is better than a bath in terms of its effect on the skin.  Use a simple sorbelene based-cream on your skin prior to showering and immediately after drying. (Examples: Hydroderm or other Sorbelene-based preparations). Especially protect healing or dry areas of skin in this way. Don't use a barrier cream with a vaseline base or with perfumes and additives. You are more likely to be allergic to these products, and cause further damage to your skin.  Make sure that you clean and cover any skin cuts or grazes that occur. Try to avoid picking or biting fingernails and the skin around the nails Keep your fingernails clipped short and clean to reduce problems caused by scratching.  For itchy skin, try gently massaging sorbelene-based cream into itchy areas instead of scratching it. A long-acting non-sedating antihistamine drug is the next option to try. However make sure that you are not taking medication that will interact with this drug type.                                                                          To prepare yourself for your treatment, it is recommended that you complete the following steps: Remove nose, ear and other body piercing items for several days prior to the treatment and keep them out during the treatment period  Purchase a new toothbrush, disposable razor (if used), sterident for dentures (if required) and a container of alcohol hand hygiene solution (gel or rub)  Discard old toothbrushes and razors when the treatment starts. Also discard opened deodorant rollers, skin adhesive tapes, skin creams and solutions- all of these may already be contaminated with staph  Discard pumice stone(s), sponges and disposable face cloths if used   Discard all make-up brushes, creams, and implements  Discard or hot wash all fluffy toys  Wash hair brush and comb, nail files, plastic toys and cutters in the dishwasher or purchase new ones  Remove nail varnish and artificial nails  Daily routine for 5 days     *Minimize contact with members of the community during the 5 days of treatment as much as possible* Body washes The effectiveness of the program increases if the correct procedure is used: Apply the provided antiseptic body wash (2% chlorhexidine) in the shower daily  Take care to wash hair, under the arms and   into the groin and into any folds of skin  Allow the antiseptic to remain on the skin for at least 5 minutes  Nasal ointment Disinfect your hands with alcohol gel/rub and allow to dry   Open the mupirocin 2% (Bactroban) nasal ointment.  Place small amount (size of match head) of ointment onto a clean cotton swab and massage gently around the inside of the nostril on one side, making sure not to insert it too deeply (no more than 2 cm or a little less than an inch).  Use a new cotton bud for the other nostril so that you do not contaminate the Bactroban tube with staph.   After applying the ointment, press a finger against the nose next to the nostril opening and use a circular  motion to spread the ointment within the nose  Apply the mupirocin ointment two times a day for 5 days  Disinfect your hands with alcohol rub/gel after applying the ointmentp>  Personal items (combs, razor, eyeglasses, jewelry, etc.)     Disinfect all personal items daily with an alcohol-based cleanser  House Environment and Clothes/Linens On day 2 and 5 of the treatment, clean your house, (especially the bedroom and bathroom). Clean dust off all surfaces and then vacuum clean all floor surfaces AND soft furnishings (such as your favorite chair). If your chair/couch has a vinyl or leather covering then wipe over the chair with warm soapy water and then dry with a clean towel (which should then be washed). Staph lives in skin scales from humans that contaminate the environment. This can lead to re-infection.   Disinfect the shower floor and/or bath tub daily   On days 1, 3, AND 5 of the treatment wash your clothes, underwear, pajamas and bed linen (such as towels, sheets, washcloths, and bath mats). A hot wash with laundry detergent is best (there is no need to use expensive laundry detergent or powder). Dry clothes in sun if possible. Change into clean clothes or pajamas on those days after your shower.   Do not share or exchange any personal items of clothing  Sports/Gym     Surfaces, equipment and towels, and skin-to-skin contact are all potential sources for staph re-infection Pets Dogs and other companion animals can also be colonized with the same strains of MRSA. Best to wash or replace bedding material for the animal and wash the animal at least once during the treatment period with antiseptic solution (2% chlorhexidine wash). Ensure that the skin of the animal is kept in good condition. If the pet has any chronic skin disorders, consult your vet prior to starting your treatment process. What about my partner, family, or household members? Usually when an aggressive strain of staph moves in to  a family or household, only certain members of that group get infections (boils). This is despite the fact that the strain has probably transferred itself from person to person within the group. Those without boils may also be carrying the bacterium, however they must have better resistance (immunity) or perhaps have better skin condition. Staph likes to invade through cuts, scratches and skin with dermatitis or dryness.    Follow-up after decolonization treatment  Possible approaches will vary depending on how your treatment goes. Your provider will instruct you on the follow-up best suited for you. Some options are:  Wait and see - if no further boils occur within 6 months then it is probable that the strain of staph has been eliminated from you.     Continue intermittent body washes 1-2 times per week with 2% aqueous chlorhexidine soap preparation or similar   Future antibiotic use  The best preventative approach to avoiding future problems may be to avoid use of antibiotics unless there is a strong indication. Antibiotics are often prescribed for minor infections or for respiratory infections that are mostly due to viruses. It is in your best interest to ride these infections out rather than taking antibiotics. Taking antibiotics alters your natural bacterial flora on your skin and in your gut. This may reduce your resistance against acquiring a resistant bacterial strain such as MRSA).   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   References Wiese-Posselt et al. Clin Inf Dis 2007:44:e88  CDC:  http://www.cdc.gov/ncidod/dhqp/ar_mrsa_ca.html   Bleach baths  Take a bleach baths twice a week for at least 15 minutes with any kind of soap for 3  weeks. Bleach baths can be a good treatment for people who do not have broken skin or eczema. They can help prevent MRSA from coming back. Use lotion after the bath, because a bleach bath can dry out the skin.    How much bleach to put in: an average full bathtub (adult level) holds about 40 gallons of water, add 1/4 cup bleach for each 1/4 fill of the bathtub.    

## 2021-07-05 NOTE — Progress Notes (Signed)
POST OPERATIVE OFFICE NOTE    CC:  F/u for surgery  HPI:  This is a 71 y.o. male who is s/p I&D of LLE fluid collection on 06/06/2021 by Dr. Donzetta Matters.    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  This has been complicated by infection.  He now has a large fluid collection in the left leg confirmed with CT scan does not appear to involve the bypass.  He is indicated for drainage.  Intraoperative findings: There was significant amounts of turbid fluid removed through an area on the lateral left leg that was already draining and a counterincision was made in the thigh.  A Penrose drain was placed at completion.  Pt returns today for follow up.  Pt states the lower incision popped open.  He states there is moderate drainage from the upper incision.  He is using 1/2 ABD pad daily for this.  The penrose drain remains in place.  He states the wounds on his leg and foot are improving with wrap.  He goes twice a week to the wound center for wrap changes.  He goes again tomorrow.  He finished his abx yesterday.  He has not had any fever or chills.   No Known Allergies  Current Outpatient Medications  Medication Sig Dispense Refill   acetaminophen (TYLENOL) 650 MG CR tablet Take 650 mg by mouth 2 (two) times daily as needed for pain.     clopidogrel (PLAVIX) 75 MG tablet TAKE 1 TABLET(75 MG) BY MOUTH DAILY 90 tablet 2   Colchicine 0.6 MG CAPS Take 0.6 mg by mouth at bedtime.     ferrous sulfate 325 (65 FE) MG tablet Take 1 tablet (325 mg total) by mouth 2 (two) times daily with a meal. (Patient taking differently: Take 325 mg by mouth daily with breakfast.) 60 tablet 0   Insulin Disposable Pump (V-GO 30) KIT Inject 1.25 Units into the skin continuous. 1.25 units per hour  Uses Novolog insulin in pump Vego 30  pump self contained  6   losartan (COZAAR) 100 MG tablet Take 100 mg by mouth daily with lunch.     Multiple Vitamins-Minerals (EQL MEGA SELECT MENS PO) Take 1 tablet by mouth daily.       NOVOLOG 100 UNIT/ML injection INJECT AS DIRECTED WITH VGO 30     ONETOUCH ULTRA test strip SMARTSIG:Via Meter     pravastatin (PRAVACHOL) 40 MG tablet Take 40 mg by mouth daily with lunch.   11   pregabalin (LYRICA) 50 MG capsule Take 50 mg by mouth daily with supper.     traMADol (ULTRAM) 50 MG tablet Take 1 tablet (50 mg total) by mouth at bedtime as needed for moderate pain. 20 tablet 0   Travoprost, BAK Free, (TRAVATAN) 0.004 % SOLN ophthalmic solution Place 1 drop into both eyes at bedtime.     triamterene-hydrochlorothiazide (MAXZIDE-25) 37.5-25 MG tablet Take 1 tablet by mouth daily with lunch.     No current facility-administered medications for this visit.     ROS:  See HPI  Physical Exam:  Today's Vitals   07/05/21 1208  BP: (!) 149/90  Pulse: 64  Resp: 20  Temp: (!) 97.1 F (36.2 C)  TempSrc: Temporal  SpO2: 100%  Weight: 258 lb 12.8 oz (117.4 kg)  Height: _0  (1.88 m)  PainSc: 4    Body mass index is 33.23 kg/m.   Incision:       Assessment/Plan:  This is  a 71 y.o. male who is s/p: s/p I&D of LLE fluid collection on 06/06/2021 by Dr. Donzetta Matters.    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  This has been complicated by infection.  He now has a large fluid collection in the left leg confirmed with CT scan does not appear to involve the bypass.     -pt seen with Dr. Donzetta Matters.  Staples were removed today.  Given there is still some moderate drainage, we will leave the Penrose in for a couple more weeks and he will see Dr. Donzetta Matters back in 2 weeks.  A wet to dry dressing was placed in the distal wound and ABD pad placed then ace wrap.  Pt for wound center tomorrow.  Pt will call sooner if any issues before next appt.  -he also needs referral for endocrinologist.  He was referred to Dr. Forde Dandy but apparently he is not taking new patients.  Our office will send referral.    Leontine Locket, [facility] [phone]   Clinic MD:  pt seen and  examined with Dr. Donzetta Matters

## 2021-07-05 NOTE — Assessment & Plan Note (Signed)
Wound reviewed in the picutre and no signs of infection noted.  No concerns on exam from today at vascular surgery office.  At this point, will continue to observe off of antibiotics.

## 2021-07-05 NOTE — Assessment & Plan Note (Signed)
Now colonized, previous history of MRSA.  I did discuss MRSA decolonization and instructions provided.

## 2021-07-05 NOTE — Assessment & Plan Note (Signed)
He will continue to go to wound care and recent swab noted.  No current signs of infection and no antibiotics indicated.

## 2021-07-06 ENCOUNTER — Encounter (HOSPITAL_BASED_OUTPATIENT_CLINIC_OR_DEPARTMENT_OTHER): Payer: Medicare Other | Admitting: Internal Medicine

## 2021-07-06 DIAGNOSIS — E11621 Type 2 diabetes mellitus with foot ulcer: Secondary | ICD-10-CM | POA: Diagnosis not present

## 2021-07-06 NOTE — Progress Notes (Signed)
[name] ([id number]) Visit Report for 07/06/2021 HPI Details Patient Name: Date of Service: [name], [name] 07/06/2021 12:30 PM Medical Record Number: [id number] Patient Account Number: [id number] Date of Birth/Sex: Treating RN: [birth year] (71 y.o. [name] Primary Care Provider: [name] Other Clinician: Referring Provider: Treating Provider/Extender: Stormy Card in Treatment: 12 History of Present Illness HPI Description: 10/11/17; [name] is a 71 year old man who tells me that in 2015 he slipped down the latter traumatizing his left leg. He developed a wound in the same spot the area that we are currently looking at. He states this closed over for the most part although he always felt it was somewhat unstable. In 2016 he hit the same area with the door of his car had this reopened. He tells me that this is never really closed although sometimes an inflow it remains open on a constant basis. He has not been using any specific dressing to this except for topical antibiotics the nature of which were not really sure. His primary doctor did send him to see Dr. [name] of interventional cardiology. He underwent an angiogram on 08/06/17 and he underwent a PTA and directional atherectomy of the lesser distal SFA and popliteal arteries which resulted in brisk improvement in blood flow. It was noted that he had 2 vessel runoff through the anterior tibial and peroneal. He is also been to see vascular and interventional radiologist. He was not felt to have any significant superficial venous insufficiency. Presumably is not a candidate for any ablation. It was suggested he come here for wound care. The patient is a type II diabetic on insulin. He also has a history of venous insufficiency. ABIs on the left were noncompressible in our clinic 10/21/17; patient we admitted to the clinic last week. 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 We put Iodosorb on him after an aggressive debridement and 3 layer compression. He complained of pain in his ankle and itching with is skin in fact he scratched the area on the medial calf superiorly at the rim of our wraps and he has 2 small open areas in that location today which are new. I changed his primary dressing today to silver collagen. As noted he is already had revascularization and does not have any significant superficial venous insufficiency that would be amenable to ablation 10/28/17; patient admitted to the clinic 2 weeks ago. He has a smaller Wound. Scratch injury from last week revealed. There is large wound over the tibial area. This is smaller. Granulation looks healthy. No need for debridement. 11/04/17; the wound on the left lateral calf looks better. Improved dimensions. Surface of this looks better. We've been maintaining him and Kerlix Coban wraps. He finds this much more comfortable. Silver collagen dressing 11/11/17; left lateral Wound continues to look healthy be making progress. Using a #5 curet I removed removed nonviable skin from the surface of the wound and then necrotic debris from the wound surface. Surface of the wound continues to look healthy. He also has an open area on the left great toenail bed. We've been using topical antibiotics. 11/19/17; left anterior lateral wound continues to look healthy but it's not closed. He also had a small wound above this on the left leg Initially traumatic wounds in the setting of significant chronic venous insufficiency and stasis dermatitis 11/25/17; left anterior wounds superiorly is closed still a small wound inferiorly. 12/02/17;  left anterior tibial area. Arrives today with adherent callus. Post debridement clearly not completely closed. Hydrofera Blue under 3 layer compression. 12/09/17; left anterior tibia. Circumferential eschar however the wound bed looks stable to improved. We've been  using Hydrofera Blue under 3 layer compression 12/17/17; left anterior tibia. Apparently this was felt to be closed however when the wrap was taken off there is a skin tear to reopen wounds in the same area we've been using Hydrofera Blue under 3 layer compression 12/23/17 left anterior tibia. Not close to close this week apparently the [facility] was stuck to this again. Still circumferential eschar requiring debridement. I put a contact layer on this this time under the Hydrofera Blue 12/31/17; left anterior tibia. Wound is better slight amount of hyper-granulation. Using Hydrofera Blue over Adaptic. 01/07/18; left anterior tibia. The wound had some surface eschar however after this was removed he has no open wound.he was already revascularized by Dr. [name] when he came to our clinic with atherectomy of the left SFA and popliteal artery. He was also sent to interventional radiology for venous reflux studies. He was not felt to have significant reflux but certainly has chronic venous changes of his skin with hemosiderin deposition around this area. He will definitely need to lubricate his skin and wear compression stocking and I've talked to him about this. READMISSION 05/26/2018 This is a now 71 year old man we cared for with traumatic wounds on his left anterior lower extremity. He had been previously revascularized during that admission by Dr. [name]. Apparently in follow-up Dr. [name] noted that he had deterioration in his arterial status. He underwent a stent placement in the distal left SFA on 04/22/2018. 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 He went back to the angiography suite on 04/30/2018 he underwent PTA and balloon angioplasty of the occluded left mid anterior tibial artery, thrombotic occlusion went from 100 to 0% which reconstitutes the posterior tibial artery. He had thrombectomy and aspiration of the peroneal artery. The stent placed in the distal SFA left SFA was  still occluded. 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 He has been recuperating at [place] home in [facility] after the most recent hospitalization. He is going home tomorrow. He tells me that 2 weeks ago he traumatized the tip of his left fifth toe. He came in urgently for our review of this. This was a history of before I noted that Dr. [name] had already noted dry gangrenous changes of the left fifth toe 06/09/2018; 2-week follow-up. I did contact Dr. [name] after his last appointment and he apparently saw 1 of Dr. Irven Shelling colleagues the next day. He does not follow-up with Dr. [name] himself until Thursday of this week. 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 Nevertheless there is no evidence of infection no drainage and no pain. He had a new area that this week when we were signing him in today on the left anterior mid tibia area, this is in close proximity to the previous wound we have dealt with in this clinic. 06/23/2018; 2-week follow-up. I did not receive a recent note from Dr. [name] to review today. Our office is trying to obtain this. He is apparently  not planning to do further vascular interventions and wondered about compression to try and help with the patient's chronic venous insufficiency. However we are also concerned about the arterial flow. He arrives in clinic today with a new area on the left third toe. The areas on the calf/anterior tibia are close to closing. The left fifth toe is still mummified using Betadine. -In reviewing things with the patient he has what sounds like claudication with mild to moderate amount of activity. 06/27/2018; x-ray of his foot suggested osteomyelitis of the left third  toe. I prescribed Levaquin over the phone while we attempted to arrange a plan of care. However the patient called yesterday to report he had low-grade fever and he came in today acutely. There is been a marked deterioration in the left third toe with spreading cellulitis up into the dorsal left foot. He was referred to the emergency room. Readmission: 06/29/2020 patient presents today for reevaluation here in our clinic he was previously treated by Dr. Dellia Nims at the latter part of 2019 in 2 the beginning of 2020. Subsequently we have not seen him since that time in the interim he did have evaluation with vein and vascular specialist specifically Dr. Anice Paganini who did perform quite extensive work for a left femoral to anterior tibial artery bypass. With that being said in the interim the patient has developed significant lymphedema and has wounds that he tells me have really never healed in regard to the incision site on the left leg. He also has multiple wounds on the feet for various reasons some of which is that he tends to pick at his feet. Fortunately there is no signs of active infection systemically at this time he does have some wounds that are little bit deeper but most are fairly superficial he seems to have good blood flow and overall everything appears to be healthy I see no bone exposed and no obvious signs of osteomyelitis. I do not know that he necessarily needs a x-ray at this point although that something we could consider depending on how things progress. The patient does have a history of lymphedema, diabetes, this is type II, chronic kidney disease stage III, hypertension, and history of peripheral vascular disease. 07/05/2020; patient admitted last week. Is a patient I remember from 2019 he had a spreading infection involving the left foot and we sent him to the hospital. 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 He subsequently had a left  femoral to anterior tibial bypass by Dr.Cain vein and vascular. He also has severe lymphedema with chronic skin changes related to that on the left leg. The most problematic area that was new today was on the left medial great toe. This was apparently a small area last week there was purulent drainage which our intake nurse cultured. Also areas on the left medial foot and heel left lateral foot. He has 2 areas on the left medial calf left lateral calf in the setting of the severe lymphedema. 07/13/2020 on evaluation today patient appears to be doing better in my opinion compared to his last visit. The good news is there is no signs of active infection systemically and locally I do not see any signs of infection either. He did have an x-ray which was negative that is great news he had a culture which showed MRSA but at the same time he is been on the doxycycline which has helped. I do think we may  want to extend this for 7 additional days 1/25; patient admitted to the clinic a few weeks ago. 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 We have been putting him under compression his edema control is a lot better but he is severe verricused skin on the left leg. He is really done quite well he still has an open area on the left medial calf and the left medial first metatarsal head. We have been using silver collagen on the leg silver alginate on the foot 07/27/2020 upon evaluation today patient appears to be doing decently well in regard to his wounds. He still has a lot of dry skin on the left leg. Some of this is starting to peel back and I think he may be able to have them out by removing some that today. Fortunately there is no signs of active infection at this time on the left leg although on the right leg he does appear to have swelling and erythema as well as some mild warmth to touch. This does have been concerned about the possibility of cellulitis although  within the differential diagnosis I do think that potentially a DVT has to be at least considered. We need to rule that out before proceeding would just call in the cellulitis. Especially since he is having pain in the posterior aspect of his calf muscle. 2/8; the patient had seen sparingly. He has severe skin changes of chronic lymphedema in the left leg thickened hyperkeratotic verrucous skin. He has an open wound on the medial part of the left first met head left mid tibia. He also has a rim of nonepithelialized skin in the anterior mid tibia. He brought in the AmLactin lotion that was been prescribed although I am not sure under compression and its utility. There concern about cellulitis on the right lower leg the last time he was here. He was put on on antibiotics. His DVT rule out was negative. The right leg looks fine he is using his stocking on this area 08/10/2020 upon evaluation today patient appears to be doing well with regard to his leg currently. He has been tolerating the dressing changes without complication. Fortunately there is no signs of active infection which is great news. Overall very pleased with where things stand. 2/22; the patient still has an area on the medial part of the left first met his head. This looks better than when I last saw this earlier this month he has a rim of epithelialization but still some surface debris. Mostly everything on the left leg is healed. There is still a vulnerable in the left mid tibia area. 08/30/2020 upon evaluation today patient appears to be doing much better in regard to his wounds on his foot. 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 Nonetheless I think we will address that today I Minna send in a prescription for him in that regard. Overall though there does not appear to be any signs of significant  worsening. 09/07/2020 on evaluation today patient's wounds over his left foot appear to be doing excellent. I do not see any signs of infection there is some callus buildup this can require debridement for certain but overall I feel like he is managing quite nicely. He still using the AmLactin cream which has been beneficial for him as well. 3/22; left foot  wound is closed. There is no open area here. He is using ammonium lactate lotion to the lower extremities to help exfoliate dry cracked skin. He has compression stockings from elastic therapy in [place]. The wound on the medial part of his left first met head is healed today. READMISSION 04/12/2021 Mr. Gasaway is a patient we know fairly well he had a prolonged stay in clinic in 2019 with wounds on his left lateral and left anterior lower extremity in the setting of chronic venous insufficiency. More recently he was here earlier this year with predominantly an area on his left foot first metatarsal head plantar and he says the plantar foot broke down on its not long after we discharged him but he did not come back here. The last few months areas of broken down on his left anterior and again the left lateral lower extremity. The leg itself is very swollen chronically enlarged a lot of hyperkeratotic dry Berry Q skin in the left lower leg. His edema extends well into the thigh. He was seen by Dr. Donzetta Matters. He had ABIs on 03/02/2021 showing an ABI on the right of 1 with a TBI of 0.72 his ABI in the left at 1.09 TBI of 0.99. Monophasic and biphasic waveforms on the right. On the left monophasic waveforms were noted he went on to have an angiogram on 03/27/2021 this showed the aortic aortic and iliac segments were free of flow-limiting stenosis the left common femoral vein to evaluate the left femoral to anterior tibial artery bypass was unobstructed the bypass was patent without any areas of stenosis. We discharged the patient in bilateral juxta lite  stockings but very clearly that was not sufficient to control the swelling and maintain skin integrity. He is clearly going to need compression pumps. 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 This is fortunate because he is on his feet for long periods of time. 10/27; patient comes in with our intake nurse reporting copious amount of green drainage from the left anterior mid tibia the left dorsal foot and to a lesser extent the left medial mid tibia. We left the compression wrap on all week for the amount of edema in his left leg is quite a bit better. We use silver alginate as the primary dressing 11/3; edema control is good. Left anterior lower leg left medial lower leg and the plantar first metatarsal head. The left anterior lower leg required debridement. Deep tissue culture I did of this wound showed MRSA I put him on 10 days of doxycycline which she will start today. We have him in compression wraps. He has a security card and [employer] however he is retiring on November 15. We will need to then get him into a better offloading boot for the left foot perhaps a total contact cast 11/10; edema control is quite good. Left anterior and left medial lower leg wounds in the setting of chronic venous insufficiency and lymphedema. He also has a substantial area over the left plantar first metatarsal head. 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 He has significant hypergranulation on the left plantar foot wound. The patient is a diabetic but he does not have significant PAD 11/17; edema control is quite good. Left anterior and left medial lower leg wounds look better. The really concerning area remains the area  on the left plantar first metatarsal head. He has a rim of epithelialization. He has been using a surgical shoe The patient is now retired from a a  [employer] I have gone over with him the need to offload this area aggressively. Starting today with a forefoot off loader but . possibly a total contact cast. 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 Arrives with a very significant odor from I think all of the wounds on the left leg and the left foot. Because of this I did not put a total contact cast on him today but will could still consider this. His wife was having cataract surgery which is the reason he missed the appointment 12/6. I saw this man 5 days ago with a swelling below the popliteal fossa. I thought he actually might have a Baker's cyst however the DVT rule out study that we could arrange right away was negative the technician told me this was not a ruptured Baker's cyst. We attempted to get this aspirated by under ultrasound guidance in interventional radiology however all they did was an ultrasound however it shows an extensive fluid collection 62 x 8 x 9.4 in the left thigh and left calf. The patient states he thinks this started 8 days ago or so but he really is not complaining of any pain, fever or systemic symptoms. He has not ha 12/20; after some difficulty I managed to get the patient into see Dr. Donzetta Matters. Eventually he was taken into the hospital and had a drain put in the fluid collection below his left knee posteriorly extending into the posterior thigh. He still has the drain in place. Culture of this showed moderate staff aureus few Morganella and few Klebsiella he is now on doxycycline and ciprofloxacin as suggested by infectious disease he is on this for a month. The drain will remain in place until it stops draining 12/29; he comes in today with the 1 wound on his left leg and the area on the left plantar first met head significantly smaller. Both look healthy. He still has the drain in the left leg. He says  he has to change this daily. Follows up with Dr. Donzetta Matters on January 11. 06/29/2021; the wounds that I am following on the left leg and left first met head continued to be quite healthy. However the area where his inferior drain is in place had copious amounts of drainage which was green in color. The wound here is larger. Follows up with Dr. Gwenlyn Saran of vein and vascular his surgeon next week as well as infectious disease. He remains on ciprofloxacin and doxycycline. He is not complaining of excessive pain in either one of the drain areas 1/12; the patient saw vascular surgery and infectious disease. Vascular surgery has left the drain in place as there was still some notable drainage still see him back in 2 weeks. Dr. Velna Ochs stop the doxycycline and ciprofloxacin and I do not believe he follows up with them at this point. Culture I did last week showed both doxycycline resistant MRSA and Pseudomonas not sensitive to ciprofloxacin although only in rare titers . Electronic Signature(s) Signed: 07/06/2021 4:29:39 PM By: Linton Ham MD Entered By: Linton Ham on 07/06/2021 13:37:30 -------------------------------------------------------------------------------- Physical Exam Details Patient Name: Date of Service: [name] 07/06/2021 12:30 PM Medical Record Number: [id number] Patient Account Number:  [id number] Date of Birth/Sex: Treating RN: Dec 31, 1950 (71 y.o. [name] Primary Care Provider: [name] Other Clinician: Referring Provider: Treating Provider/Extender: Stormy Card in Treatment: 12 Constitutional Patient is hypertensive.. Pulse regular and within target range for patient.Marland Kitchen Respirations regular, non-labored and within target range.. Temperature is normal and within the target range for the patient.Marland Kitchen Appears in no distress. Notes Wound exam; left lateral leg wound is just about closed. The medial wound is closed his left first plantar metatarsal  head looks smaller with healthy granulation. . He has the 2 drain sites 1 on the left lateral calf just below the knee and one on the left lateral thigh just above the knee. The calf wound is large and tunnels superiorly there is no drainage no tenderness although the patient thinks he is starting to develop another fluid collection posteriorly just above the popliteal fossa The patient has PAD and has been revascularized. He has peripheral pulses palpable although they are reduced. He does not describe claudication Electronic Signature(s) Signed: 07/06/2021 4:29:39 PM By: Linton Ham MD Entered By: Linton Ham on 07/06/2021 13:39:29 -------------------------------------------------------------------------------- Physician Orders Details Patient Name: Date of Service: [name] 07/06/2021 12:30 PM Medical Record Number: [id number] Patient Account Number: [id number] Date of Birth/Sex: Treating RN: [birth year] (71 y.o. [name] Primary Care Provider: [name] Other Clinician: Referring Provider: Treating Provider/Extender: Stormy Card in Treatment: 12 Verbal / Phone Orders: No Diagnosis Coding ICD-10 Coding Code Description E11.621 Type 2 diabetes mellitus with foot ulcer 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 I89.0 Lymphedema, not elsewhere classified I87.322 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verity 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 of other part of left foot with other specified severity E11.42 Type 2 diabetes mellitus with diabetic polyneuropathy L02.416 Cutaneous abscess of left lower limb Follow-up Appointments ppointment in 1 week. Dellia Nims on Thursday Return A Nurse Visit: - Tuesday Bathing/ Shower/ Hygiene May shower with protection but do not  get wound dressing(s) wet. - Use a cast protector so you can shower without getting your wrap(s) wet Edema Control - Lymphedema / SCD / Other Elevate legs to the level of the heart or above for 30 minutes daily and/or when sitting, a frequency of: - throughout the day Avoid standing for long periods of time. Patient to wear own compression stockings every day. - on right leg; Moisturize legs daily. - Ammonium LACTATE to BLE every day. Off-Loading Wedge shoe to: - left front foot offloader Wound Treatment Wound #18 - Metatarsal head first Wound Laterality: Plantar, Left Cleanser: Soap and Water 2 x Per Week/7 Days Discharge Instructions: May shower and wash wound with dial antibacterial soap and water prior to dressing change. Cleanser: Wound Cleanser 2 x Per Week/7 Days Discharge Instructions: Cleanse the wound with wound cleanser prior to applying a clean dressing using gauze sponges, not tissue or cotton balls. Peri-Wound Care: Triamcinolone 15 (g) 2 x Per Week/7 Days Discharge Instructions: Use triamcinolone 15 (g) as directed Peri-Wound Care: Zinc Oxide Ointment 30g tube 2 x Per Week/7 Days Discharge Instructions: Apply Zinc Oxide to periwound with each dressing change Peri-Wound Care: Sween Lotion (Moisturizing lotion) 2 x Per Week/7 Days Discharge Instructions: Apply moisturizing lotion as directed Prim Dressing: Hydrofera Blue Classic Foam, 4x4 in 2 x Per Week/7 Days ary Discharge Instructions: Moisten with saline prior to applying  to wound bed Secondary Dressing: ABD Pad, 5x9 2 x Per Week/7 Days Discharge Instructions: Apply over primary dressing as directed. Secondary Dressing: Zetuvit Plus 4x8 in 2 x Per Week/7 Days Discharge Instructions: Apply over primary dressing as directed. Secondary Dressing: CarboFLEX Odor Control Dressing, 4x4 in 2 x Per Week/7 Days Discharge Instructions: Apply over primary dressing as directed. Compression Wrap: CoFlex TLC XL 2-layer Compression  System 4x7 (in/yd) 2 x Per Week/7 Days Discharge Instructions: Apply CoFlex 2-layer compression as directed. (alt for 4 layer) Wound #19 - Lower Leg Wound Laterality: Left, Anterior Cleanser: Soap and Water 2 x Per Week/7 Days Discharge Instructions: May shower and wash wound with dial antibacterial soap and water prior to dressing change. Cleanser: Wound Cleanser 2 x Per Week/7 Days Discharge Instructions: Cleanse the wound with wound cleanser prior to applying a clean dressing using gauze sponges, not tissue or cotton balls. Peri-Wound Care: Triamcinolone 15 (g) 2 x Per Week/7 Days Discharge Instructions: Use triamcinolone 15 (g) as directed Peri-Wound Care: Zinc Oxide Ointment 30g tube 2 x Per Week/7 Days Discharge Instructions: Apply Zinc Oxide to periwound with each dressing change Peri-Wound Care: Sween Lotion (Moisturizing lotion) 2 x Per Week/7 Days Discharge Instructions: Apply moisturizing lotion as directed Prim Dressing: Hydrofera Blue Classic Foam, 4x4 in 2 x Per Week/7 Days ary Discharge Instructions: Moisten with saline prior to applying to wound bed Secondary Dressing: ABD Pad, 5x9 2 x Per Week/7 Days Discharge Instructions: Apply over primary dressing as directed. Secondary Dressing: Zetuvit Plus 4x8 in 2 x Per Week/7 Days Discharge Instructions: Apply over primary dressing as directed. Secondary Dressing: CarboFLEX Odor Control Dressing, 4x4 in 2 x Per Week/7 Days Discharge Instructions: Apply over primary dressing as directed. Compression Wrap: CoFlex TLC XL 2-layer Compression System 4x7 (in/yd) 2 x Per Week/7 Days Discharge Instructions: Apply CoFlex 2-layer compression as directed. (alt for 4 layer) Wound #22 - Lower Leg Wound Laterality: Left, Lateral Cleanser: Soap and Water 2 x Per Week/7 Days Discharge Instructions: May shower and wash wound with dial antibacterial soap and water prior to dressing change. Cleanser: Wound Cleanser 2 x Per Week/7 Days Discharge  Instructions: Cleanse the wound with wound cleanser prior to applying a clean dressing using gauze sponges, not tissue or cotton balls. Peri-Wound Care: Triamcinolone 15 (g) 2 x Per Week/7 Days Discharge Instructions: Use triamcinolone 15 (g) as directed Peri-Wound Care: Zinc Oxide Ointment 30g tube 2 x Per Week/7 Days Discharge Instructions: Apply Zinc Oxide to periwound with each dressing change Peri-Wound Care: Sween Lotion (Moisturizing lotion) 2 x Per Week/7 Days Discharge Instructions: Apply moisturizing lotion as directed Prim Dressing: KerraCel Ag Gelling Fiber Dressing, 4x5 in (silver alginate) 2 x Per Week/7 Days ary Discharge Instructions: Apply silver alginate to wound bed as instructed Secondary Dressing: ABD Pad, 5x9 2 x Per Week/7 Days Discharge Instructions: Apply over primary dressing as directed. Secondary Dressing: Zetuvit Plus 4x8 in 2 x Per Week/7 Days Discharge Instructions: Apply over primary dressing as directed. Secondary Dressing: CarboFLEX Odor Control Dressing, 4x4 in 2 x Per Week/7 Days Discharge Instructions: Apply over primary dressing as directed. Compression Wrap: CoFlex TLC XL 2-layer Compression System 4x7 (in/yd) 2 x Per Week/7 Days Discharge Instructions: Apply CoFlex 2-layer compression as directed. (alt for 4 layer) Electronic Signature(s) Signed: 07/06/2021 4:29:39 PM By: Linton Ham MD Signed: 07/06/2021 5:44:04 PM By: Lorrin Jackson Entered By: Lorrin Jackson on 07/06/2021 13:15:04 -------------------------------------------------------------------------------- Problem List Details Patient Name: Date of Service: [name] 07/06/2021 12:30 PM  Medical Record Number: [id number] Patient Account Number: [id number] Date of Birth/Sex: Treating RN: [birth year] (71 y.o. [name] Primary Care Provider: [name] Other Clinician: Referring Provider: Treating Provider/Extender: Stormy Card in Treatment:  12 Active Problems ICD-10 Encounter Code Description Active Date MDM Diagnosis E11.621 Type 2 diabetes mellitus with foot ulcer 04/12/2021 No Yes 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 04/12/2021 No Yes I89.0 Lymphedema, not elsewhere classified 04/12/2021 No Yes I87.322 Chronic venous hypertension (idiopathic) with inflammation of left lower 04/12/2021 No Yes extremity 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 ulcer of other part of left lower leg with other specified 04/12/2021 No Yes severity 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 of other part of left foot with other specified 04/12/2021 No Yes severity E11.42 Type 2 diabetes mellitus with diabetic polyneuropathy 04/12/2021 No Yes L02.416 Cutaneous abscess of left lower limb 06/13/2021 No Yes Inactive Problems Resolved Problems Electronic Signature(s) Signed: 07/06/2021 4:29:39 PM By: Linton Ham MD Entered By: Linton Ham on 07/06/2021 13:36:05 -------------------------------------------------------------------------------- Progress Note Details Patient Name: Date of Service: [name] 07/06/2021 12:30 PM Medical Record Number: [id number] Patient Account Number: [id number] Date of Birth/Sex: Treating RN: May 09, 1951 (71 y.o. [name] Primary Care Provider: [name] Other Clinician: Referring Provider: Treating Provider/Extender: Stormy Card in Treatment: 12 Subjective History of Present Illness (HPI) 10/11/17; Mr. Deery is a 71 year old man who tells me that in 2015 he slipped down the latter traumatizing his left leg. He developed a wound in the same spot the area that we are currently looking at. He states this closed over for the most part although he always felt it was somewhat unstable. In 2016 he hit the same area with the door of his car had this reopened. He tells me that this is never really closed although sometimes an inflow it remains  open on a constant basis. He has not been using any specific dressing to this except for topical antibiotics the nature of which were not really sure. His primary doctor did send him to see Dr. [name] of interventional cardiology. He underwent an angiogram on 08/06/17 and he underwent a PTA and directional atherectomy of the lesser distal SFA and popliteal arteries which resulted in brisk improvement in blood flow. It was noted that he had 2 vessel runoff through the anterior tibial and peroneal. He is also been to see vascular and interventional radiologist. He was not felt to have any significant superficial venous insufficiency. Presumably is not a candidate for any ablation. It was suggested he come here for wound care. The patient is a type II diabetic on insulin. He also has a history of venous insufficiency. ABIs on the left were noncompressible in our clinic 10/21/17; patient we admitted to the clinic last week. 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 We put Iodosorb on him after an aggressive debridement and 3 layer compression. He complained of pain in his ankle and itching with is skin in fact he scratched the area on the medial calf superiorly at the rim of our wraps and he has 2 small open areas in that location today which are new. I changed his primary dressing today to silver collagen. As noted he is already had revascularization and does not have any significant superficial venous insufficiency that would be amenable to ablation 10/28/17; patient admitted to the clinic 2 weeks ago. He has a smaller Wound. Scratch injury  from last week revealed. There is large wound over the tibial area. This is smaller. Granulation looks healthy. No need for debridement. 11/04/17; the wound on the left lateral calf looks better. Improved dimensions. Surface of this looks better. We've been maintaining him and Kerlix Coban wraps. He finds  this much more comfortable. Silver collagen dressing 11/11/17; left lateral Wound continues to look healthy be making progress. Using a #5 curet I removed removed nonviable skin from the surface of the wound and then necrotic debris from the wound surface. Surface of the wound continues to look healthy. ooHe also has an open area on the left great toenail bed. We've been using topical antibiotics. 11/19/17; left anterior lateral wound continues to look healthy but it's not closed. ooHe also had a small wound above this on the left leg ooInitially traumatic wounds in the setting of significant chronic venous insufficiency and stasis dermatitis 11/25/17; left anterior wounds superiorly is closed still a small wound inferiorly. 12/02/17; left anterior tibial area. Arrives today with adherent callus. Post debridement clearly not completely closed. Hydrofera Blue under 3 layer compression. 12/09/17; left anterior tibia. Circumferential eschar however the wound bed looks stable to improved. We've been using Hydrofera Blue under 3 layer compression 12/17/17; left anterior tibia. Apparently this was felt to be closed however when the wrap was taken off there is a skin tear to reopen wounds in the same area we've been using Hydrofera Blue under 3 layer compression 12/23/17 left anterior tibia. Not close to close this week apparently the [facility] was stuck to this again. Still circumferential eschar requiring debridement. I put a contact layer on this this time under the Hydrofera Blue 12/31/17; left anterior tibia. Wound is better slight amount of hyper-granulation. Using Hydrofera Blue over Adaptic. 01/07/18; left anterior tibia. The wound had some surface eschar however after this was removed he has no open wound.he was already revascularized by Dr. [name] when he came to our clinic with atherectomy of the left SFA and popliteal artery. He was also sent to interventional radiology for venous reflux  studies. He was not felt to have significant reflux but certainly has chronic venous changes of his skin with hemosiderin deposition around this area. He will definitely need to lubricate his skin and wear compression stocking and I've talked to him about this. READMISSION 05/26/2018 This is a now 71 year old man we cared for with traumatic wounds on his left anterior lower extremity. He had been previously revascularized during that admission by Dr. [name]. Apparently in follow-up Dr. [name] noted that he had deterioration in his arterial status. He underwent a stent placement in the distal left SFA on 04/22/2018. 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 He went back to the angiography suite on 04/30/2018 he underwent PTA and balloon angioplasty of the occluded left mid anterior tibial artery, thrombotic occlusion went from 100 to 0% which reconstitutes the posterior tibial artery. He had thrombectomy and aspiration of the peroneal artery. The stent placed in the distal SFA left SFA was still occluded. 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 He has been recuperating at [place] home in [facility] after the most recent hospitalization. He is going home tomorrow. He tells me that 2 weeks ago  he traumatized the tip of his left fifth toe. He came in urgently for our review of this. This was a history of before I noted that Dr. [name] had already noted dry gangrenous changes of the left fifth toe 06/09/2018; 2-week follow-up. I did contact Dr. [name] after his last appointment and he apparently saw 1 of Dr. Irven Shelling colleagues the next day. He does not follow-up with Dr. [name] himself until Thursday of this week. He has dry gangrene on  the tip of most of his left fifth toe. Nevertheless there is no evidence of infection no drainage and no pain. He had a new area that this week when we were signing him in today on the left anterior mid tibia area, this is in close proximity to the previous wound we have dealt with in this clinic. 06/23/2018; 2-week follow-up. I did not receive a recent note from Dr. [name] to review today. Our office is trying to obtain this. He is apparently not planning to do further vascular interventions and wondered about compression to try and help with the patient's chronic venous insufficiency. However we are also concerned about the arterial flow. ooHe arrives in clinic today with a new area on the left third toe. The areas on the calf/anterior tibia are close to closing. The left fifth toe is still mummified using Betadine. -In reviewing things with the patient he has what sounds like claudication with mild to moderate amount of activity. 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 I prescribed Levaquin over the phone while we attempted to arrange a plan of care. However the patient called yesterday to report he had low-grade fever and he came in today acutely. There is been a marked deterioration in the left third toe with spreading cellulitis up into the dorsal left foot. He was referred to the emergency room. Readmission: 06/29/2020 patient presents today for reevaluation here in our clinic he was previously treated by Dr. Dellia Nims at the latter part of 2019 in 2 the beginning of 2020. Subsequently we have not seen him since that time in the interim he did have evaluation with vein and vascular specialist specifically Dr. Anice Paganini who did perform quite extensive work for a left femoral to anterior tibial artery bypass. With that being said in the interim the patient has developed significant lymphedema and has wounds that he tells me have really never healed in regard to  the incision site on the left leg. He also has multiple wounds on the feet for various reasons some of which is that he tends to pick at his feet. Fortunately there is no signs of active infection systemically at this time he does have some wounds that are little bit deeper but most are fairly superficial he seems to have good blood flow and overall everything appears to be healthy I see no bone exposed and no obvious signs of osteomyelitis. I do not know that he necessarily needs a x-ray at this point although that something we could consider depending on how things progress. The patient does have a history of lymphedema, diabetes, this is type II, chronic kidney disease stage III, hypertension, and history of peripheral vascular disease. 07/05/2020; patient admitted last week. Is a patient I remember from 2019 he had a spreading infection involving the left foot and we sent him to the hospital. 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 He subsequently had a left femoral  to anterior tibial bypass by Dr.Cain vein and vascular. He also has severe lymphedema with chronic skin changes related to that on the left leg. The most problematic area that was new today was on the left medial great toe. This was apparently a small area last week there was purulent drainage which our intake nurse cultured. Also areas on the left medial foot and heel left lateral foot. He has 2 areas on the left medial calf left lateral calf in the setting of the severe lymphedema. 07/13/2020 on evaluation today patient appears to be doing better in my opinion compared to his last visit. The good news is there is no signs of active infection systemically and locally I do not see any signs of infection either. He did have an x-ray which was negative that is great news he had a culture which showed MRSA but at the same time he is been on the doxycycline which has helped. I do think we may want to  extend this for 7 additional days 1/25; patient admitted to the clinic a few weeks ago. 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 We have been putting him under compression his edema control is a lot better but he is severe verricused skin on the left leg. He is really done quite well he still has an open area on the left medial calf and the left medial first metatarsal head. We have been using silver collagen on the leg silver alginate on the foot 07/27/2020 upon evaluation today patient appears to be doing decently well in regard to his wounds. He still has a lot of dry skin on the left leg. Some of this is starting to peel back and I think he may be able to have them out by removing some that today. Fortunately there is no signs of active infection at this time on the left leg although on the right leg he does appear to have swelling and erythema as well as some mild warmth to touch. This does have been concerned about the possibility of cellulitis although within the differential diagnosis I do think that potentially a DVT has to be at least considered. We need to rule that out before proceeding would just call in the cellulitis. Especially since he is having pain in the posterior aspect of his calf muscle. 2/8; the patient had seen sparingly. He has severe skin changes of chronic lymphedema in the left leg thickened hyperkeratotic verrucous skin. He has an open wound on the medial part of the left first met head left mid tibia. He also has a rim of nonepithelialized skin in the anterior mid tibia. He brought in the AmLactin lotion that was been prescribed although I am not sure under compression and its utility. There concern about cellulitis on the right lower leg the last time he was here. He was put on on antibiotics. His DVT rule out was negative. The right leg looks fine he is using his stocking on this area 08/10/2020 upon evaluation today patient  appears to be doing well with regard to his leg currently. He has been tolerating the dressing changes without complication. Fortunately there is no signs of active infection which is great news. Overall very pleased with where things stand. 2/22; the patient still has an area on the medial part of the left first met his head. This looks better than when I last saw this earlier this month he has a rim of epithelialization  but still some surface debris. Mostly everything on the left leg is healed. There is still a vulnerable in the left mid tibia area. 08/30/2020 upon evaluation today patient appears to be doing much better in regard to his wounds on his foot. 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 Nonetheless I think we will address that today I Minna send in a prescription for him in that regard. Overall though there does not appear to be any signs of significant worsening. 09/07/2020 on evaluation today patient's wounds over his left foot appear to be doing excellent. I do not see any signs of infection there is some callus buildup this can require debridement for certain but overall I feel like he is managing quite nicely. He still using the AmLactin cream which has been beneficial for him as well. 3/22; left foot wound is closed. There is no open area here. He is using ammonium lactate lotion to the lower extremities to help exfoliate dry cracked skin. He has compression stockings from elastic therapy in [place]. The wound on the medial part of his left first met head is healed today. READMISSION 04/12/2021 Mr. Letizia is a patient we know fairly well he had a prolonged stay in clinic in 2019 with wounds on his left lateral and left anterior lower extremity in the setting of chronic venous insufficiency. More recently he was here earlier this year with  predominantly an area on his left foot first metatarsal head plantar and he says the plantar foot broke down on its not long after we discharged him but he did not come back here. The last few months areas of broken down on his left anterior and again the left lateral lower extremity. The leg itself is very swollen chronically enlarged a lot of hyperkeratotic dry Berry Q skin in the left lower leg. His edema extends well into the thigh. He was seen by Dr. Donzetta Matters. He had ABIs on 03/02/2021 showing an ABI on the right of 1 with a TBI of 0.72 his ABI in the left at 1.09 TBI of 0.99. Monophasic and biphasic waveforms on the right. On the left monophasic waveforms were noted he went on to have an angiogram on 03/27/2021 this showed the aortic aortic and iliac segments were free of flow-limiting stenosis the left common femoral vein to evaluate the left femoral to anterior tibial artery bypass was unobstructed the bypass was patent without any areas of stenosis. We discharged the patient in bilateral juxta lite stockings but very clearly that was not sufficient to control the swelling and maintain skin integrity. He is clearly going to need compression pumps. 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 This is fortunate because he is on his feet for long periods of time. 10/27; patient comes in with our intake nurse reporting copious amount of green drainage from the left anterior mid tibia the left dorsal foot and to a lesser extent the left medial mid tibia. We left the compression wrap on all week for the amount of edema in his left leg is quite a bit better. We use silver alginate as the primary dressing 11/3; edema control is good. Left anterior lower leg left medial lower leg and the plantar first metatarsal head. The left anterior lower leg required debridement. Deep  tissue culture I did of this wound showed MRSA I put him on 10 days of doxycycline which  she will start today. We have him in compression wraps. He has a security card and [employer] however he is retiring on November 15. We will need to then get him into a better offloading boot for the left foot perhaps a total contact cast 11/10; edema control is quite good. Left anterior and left medial lower leg wounds in the setting of chronic venous insufficiency and lymphedema. He also has a substantial area over the left plantar first metatarsal head. 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 He has significant hypergranulation on the left plantar foot wound. The patient is a diabetic but he does not have significant PAD 11/17; edema control is quite good. Left anterior and left medial lower leg wounds look better. The really concerning area remains the area on the left plantar first metatarsal head. He has a rim of epithelialization. 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 Starting today with a forefoot off loader but . possibly a total contact cast. 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 Arrives with a very significant odor from I think all of the wounds on the left leg and the left foot. Because of this I did not put a total contact cast on him today but will could still consider this. His wife was having cataract surgery which is the reason he missed the appointment 12/6. I saw this man 5 days ago with a swelling below the popliteal fossa. I thought he actually might have a Baker's cyst however the DVT rule out study that we could arrange right away was negative the technician told me this was not a ruptured Baker's cyst. We attempted to get this aspirated by under  ultrasound guidance in interventional radiology however all they did was an ultrasound however it shows an extensive fluid collection 62 x 8 x 9.4 in the left thigh and left calf. The patient states he thinks this started 8 days ago or so but he really is not complaining of any pain, fever or systemic symptoms. He has not ha 12/20; after some difficulty I managed to get the patient into see Dr. Donzetta Matters. Eventually he was taken into the hospital and had a drain put in the fluid collection below his left knee posteriorly extending into the posterior thigh. He still has the drain in place. Culture of this showed moderate staff aureus few Morganella and few Klebsiella he is now on doxycycline and ciprofloxacin as suggested by infectious disease he is on this for a month. The drain will remain in place until it stops draining 12/29; he comes in today with the 1 wound on his left leg and the area on the left plantar first met head significantly smaller. Both look healthy. He still has the drain in the left leg. He says he has to change this daily. Follows up with Dr. Donzetta Matters on January 11. 06/29/2021; the wounds that I am following on the left leg and left first met head continued to be quite healthy. However the area where his inferior drain is in  place had copious amounts of drainage which was green in color. The wound here is larger. Follows up with Dr. Gwenlyn Saran of vein and vascular his surgeon next week as well as infectious disease. He remains on ciprofloxacin and doxycycline. He is not complaining of excessive pain in either one of the drain areas 1/12; the patient saw vascular surgery and infectious disease. Vascular surgery has left the drain in place as there was still some notable drainage still see him back in 2 weeks. Dr. Velna Ochs stop the doxycycline and ciprofloxacin and I do not believe he follows up with them at this point. Culture I did last week showed both doxycycline resistant MRSA and Pseudomonas not  sensitive to ciprofloxacin although only in rare titers . Objective Constitutional Patient is hypertensive.. Pulse regular and within target range for patient.Marland Kitchen Respirations regular, non-labored and within target range.. Temperature is normal and within the target range for the patient.Marland Kitchen Appears in no distress. Vitals Time Taken: 12:48 PM, Height: 74 in, Weight: 238 lbs, BMI: 30.6, Temperature: 98.3 F, Pulse: 63 bpm, Respiratory Rate: 18 breaths/min, Blood Pressure: 156/81 mmHg, Capillary Blood Glucose: 88 mg/dl. General Notes: Wound exam; left lateral leg wound is just about closed. The medial wound is closed his left first plantar metatarsal head looks smaller with healthy granulation. oo. He has the 2 drain sites 1 on the left lateral calf just below the knee and one on the left lateral thigh just above the knee. The calf wound is large and tunnels superiorly there is no drainage no tenderness although the patient thinks he is starting to develop another fluid collection posteriorly just above the popliteal fossa oo The patient has PAD and has been revascularized. He has peripheral pulses palpable although they are reduced. He does not describe claudication Integumentary (Hair, Skin) Wound #18 status is Open. Original cause of wound was Gradually Appeared. The date acquired was: 08/23/2020. The wound has been in treatment 12 weeks. The wound is located on the Left,Plantar Metatarsal head first. The wound measures 1.8cm length x 2.6cm width x 0.1cm depth; 3.676cm^2 area and 0.368cm^3 volume. There is Fat Layer (Subcutaneous Tissue) exposed. There is no tunneling or undermining noted. There is a medium amount of serosanguineous drainage noted. The wound margin is distinct with the outline attached to the wound base. There is large (67-100%) red granulation within the wound bed. There is no necrotic tissue within the wound bed. General Notes: calloused periwound Wound #19 status is Open.  Original cause of wound was Gradually Appeared. The date acquired was: 08/23/2020. The wound has been in treatment 12 weeks. The wound is located on the Left,Anterior Lower Leg. The wound measures 0.6cm length x 0.3cm width x 0.1cm depth; 0.141cm^2 area and 0.014cm^3 volume. There is Fat Layer (Subcutaneous Tissue) exposed. There is no tunneling or undermining noted. There is a medium amount of serosanguineous drainage noted. The wound margin is distinct with the outline attached to the wound base. There is large (67-100%) red, pink granulation within the wound bed. There is no necrotic tissue within the wound bed. Wound #22 status is Open. Original cause of wound was Bump. The date acquired was: 06/03/2021. The wound has been in treatment 4 weeks. The wound is located on the Left,Lateral Lower Leg. The wound measures 3cm length x 2cm width x 1cm depth; 4.712cm^2 area and 4.712cm^3 volume. There is Fat Layer (Subcutaneous Tissue) exposed. There is no tunneling or undermining noted. There is a large amount of purulent drainage noted. The wound  margin is distinct with the outline attached to the wound base. There is large (67-100%) red granulation within the wound bed. There is a small (1-33%) amount of necrotic tissue within the wound bed including Adherent Slough. 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 There was a Double Layer Compression Therapy Procedure by Lorrin Jackson, RN. Post procedure Diagnosis Wound #18: Same as Pre-Procedure Wound #19 Pre-procedure diagnosis of Wound #19 is a Diabetic Wound/Ulcer of the Lower Extremity located on the Left,Anterior Lower Leg . There was a Double Layer Compression Therapy Procedure by Lorrin Jackson, RN. Post procedure Diagnosis Wound #19: Same as Pre-Procedure Wound #22 Pre-procedure diagnosis of Wound #22 is a Cyst located on the Left,Lateral Lower Leg . There was a Double Layer Compression Therapy Procedure by Lorrin Jackson, RN. Post procedure Diagnosis Wound #22: Same as Pre-Procedure Plan Follow-up Appointments: Return Appointment in 1 week. Dellia Nims on Thursday Nurse Visit: - Tuesday Bathing/ Shower/ Hygiene: May shower with protection but do not get wound dressing(s) wet. - Use a cast protector so you can shower without getting your wrap(s) wet Edema Control - Lymphedema / SCD / Other: Elevate legs to the level of the heart or above for 30 minutes daily and/or when sitting, a frequency of: - throughout the day Avoid standing for long periods of time. Patient to wear own compression stockings every day. - on right leg; Moisturize legs daily. - Ammonium LACTATE to BLE every day. Off-Loading: Wedge shoe to: - left front foot offloader WOUND #18: - Metatarsal head first Wound Laterality: Plantar, Left Cleanser: Soap and Water 2 x Per Week/7 Days Discharge Instructions: May shower and wash wound with dial antibacterial soap and water prior to dressing change. Cleanser: Wound Cleanser 2 x Per Week/7 Days Discharge Instructions: Cleanse the wound with wound cleanser prior to applying a clean dressing using gauze sponges, not tissue or cotton balls. Peri-Wound Care: Triamcinolone 15 (g) 2 x Per Week/7 Days Discharge Instructions: Use triamcinolone 15 (g) as directed Peri-Wound Care: Zinc  Oxide Ointment 30g tube 2 x Per Week/7 Days Discharge Instructions: Apply Zinc Oxide to periwound with each dressing change Peri-Wound Care: Sween Lotion (Moisturizing lotion) 2 x Per Week/7 Days Discharge Instructions: Apply moisturizing lotion as directed Prim Dressing: Hydrofera Blue Classic Foam, 4x4 in 2 x Per Week/7 Days ary Discharge Instructions: Moisten with saline prior to applying to wound bed Secondary Dressing: ABD Pad, 5x9 2 x Per Week/7 Days Discharge Instructions: Apply over primary dressing as directed. Secondary Dressing: Zetuvit Plus 4x8 in 2 x Per Week/7 Days Discharge Instructions: Apply over primary dressing as directed. Secondary Dressing: CarboFLEX Odor Control Dressing, 4x4 in 2 x Per Week/7 Days Discharge Instructions: Apply over primary dressing as directed. Com pression Wrap: CoFlex TLC XL 2-layer Compression System 4x7 (in/yd) 2 x Per Week/7 Days Discharge Instructions: Apply CoFlex 2-layer compression as directed. (alt for 4 layer) WOUND #19: - Lower Leg Wound Laterality: Left, Anterior Cleanser: Soap and Water  2 x Per Week/7 Days Discharge Instructions: May shower and wash wound with dial antibacterial soap and water prior to dressing change. Cleanser: Wound Cleanser 2 x Per Week/7 Days Discharge Instructions: Cleanse the wound with wound cleanser prior to applying a clean dressing using gauze sponges, not tissue or cotton balls. Peri-Wound Care: Triamcinolone 15 (g) 2 x Per Week/7 Days Discharge Instructions: Use triamcinolone 15 (g) as directed Peri-Wound Care: Zinc Oxide Ointment 30g tube 2 x Per Week/7 Days Discharge Instructions: Apply Zinc Oxide to periwound with each dressing change Peri-Wound Care: Sween Lotion (Moisturizing lotion) 2 x Per Week/7 Days Discharge Instructions: Apply moisturizing lotion as directed Prim Dressing: Hydrofera Blue Classic Foam, 4x4 in 2 x Per Week/7 Days ary Discharge Instructions: Moisten with saline prior to applying to  wound bed Secondary Dressing: ABD Pad, 5x9 2 x Per Week/7 Days Discharge Instructions: Apply over primary dressing as directed. Secondary Dressing: Zetuvit Plus 4x8 in 2 x Per Week/7 Days Discharge Instructions: Apply over primary dressing as directed. Secondary Dressing: CarboFLEX Odor Control Dressing, 4x4 in 2 x Per Week/7 Days Discharge Instructions: Apply over primary dressing as directed. Com pression Wrap: CoFlex TLC XL 2-layer Compression System 4x7 (in/yd) 2 x Per Week/7 Days Discharge Instructions: Apply CoFlex 2-layer compression as directed. (alt for 4 layer) WOUND #22: - Lower Leg Wound Laterality: Left, Lateral Cleanser: Soap and Water 2 x Per Week/7 Days Discharge Instructions: May shower and wash wound with dial antibacterial soap and water prior to dressing change. Cleanser: Wound Cleanser 2 x Per Week/7 Days Discharge Instructions: Cleanse the wound with wound cleanser prior to applying a clean dressing using gauze sponges, not tissue or cotton balls. Peri-Wound Care: Triamcinolone 15 (g) 2 x Per Week/7 Days Discharge Instructions: Use triamcinolone 15 (g) as directed Peri-Wound Care: Zinc Oxide Ointment 30g tube 2 x Per Week/7 Days Discharge Instructions: Apply Zinc Oxide to periwound with each dressing change Peri-Wound Care: Sween Lotion (Moisturizing lotion) 2 x Per Week/7 Days Discharge Instructions: Apply moisturizing lotion as directed Prim Dressing: KerraCel Ag Gelling Fiber Dressing, 4x5 in (silver alginate) 2 x Per Week/7 Days ary Discharge Instructions: Apply silver alginate to wound bed as instructed Secondary Dressing: ABD Pad, 5x9 2 x Per Week/7 Days Discharge Instructions: Apply over primary dressing as directed. Secondary Dressing: Zetuvit Plus 4x8 in 2 x Per Week/7 Days Discharge Instructions: Apply over primary dressing as directed. Secondary Dressing: CarboFLEX Odor Control Dressing, 4x4 in 2 x Per Week/7 Days Discharge Instructions: Apply over primary  dressing as directed. Com pression Wrap: CoFlex TLC XL 2-layer Compression System 4x7 (in/yd) 2 x Per Week/7 Days Discharge Instructions: Apply CoFlex 2-layer compression as directed. (alt for 4 layer) 1. I continued the Hydrofera Blue to the left leg and left plantar foot wounds. He is doing well here 2. Silver alginate in the calf where the drainage site is. 3. I do not think the culture I did last week needs to be addressed systemically I will use topical silver alginate in this area Electronic Signature(s) Signed: 07/06/2021 4:29:39 PM By: Linton Ham MD Entered By: Linton Ham on 07/06/2021 13:40:40 -------------------------------------------------------------------------------- SuperBill Details Patient Name: Date of Service: [name] 07/06/2021 Medical Record Number: [id number] Patient Account Number: [id number] Date of Birth/Sex: Treating RN: April 12, 1951 (71 y.o. [name] Primary Care Provider: [name] Other Clinician: Referring Provider: Treating Provider/Extender: Stormy Card in Treatment: 12 Diagnosis Coding ICD-10 Codes Code Description [phone] Type 2 diabetes mellitus with foot ulcer E11.51  Type 2 diabetes mellitus with diabetic peripheral angiopathy without gangrene I89.0 Lymphedema, not elsewhere classified I87.322 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verity 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 of other part of left foot with other specified severity E11.42 Type 2 diabetes mellitus with diabetic polyneuropathy L02.416 Cutaneous abscess of left lower limb Facility Procedures CPT4 Code: [id number] Description: (Facility Use Only) [phone] - APPLY MULTLAY COMPRS LWR LT LEG ICD-10 Diagnosis Description 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 ulcer of other part of left lower leg with other specified  severity Modifier: Quantity: 1 Physician Procedures : CPT4 Code Description Modifier [id number] - WC PHYS LEVEL 4 - EST PT ICD-10 Diagnosis Description 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verity 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 of other part of  left foot with other specified severity L02.416 Cutaneous abscess of left lower limb E11.621 Type 2 diabetes mellitus with foot ulcer Quantity: 1 Electronic Signature(s) Signed: 07/06/2021 4:29:39 PM By: Linton Ham MD Entered By: Linton Ham on 07/06/2021 13:41:02

## 2021-07-07 LAB — LIPID PANEL
Cholesterol: 112 (ref 0–200)
HDL: 43 (ref 35–70)
LDL Cholesterol: 55
LDl/HDL Ratio: 2.6
Triglycerides: 62 (ref 40–160)

## 2021-07-07 LAB — BASIC METABOLIC PANEL
CO2: 22 (ref 13–22)
Chloride: 105 (ref 99–108)
Glucose: 47
Potassium: 3.9 (ref 3.4–5.3)
Sodium: 138 (ref 137–147)

## 2021-07-07 LAB — HEPATIC FUNCTION PANEL
ALT: 14 (ref 10–40)
AST: 19 (ref 14–40)
Alkaline Phosphatase: 94 (ref 25–125)
Bilirubin, Total: 0.4

## 2021-07-07 LAB — COMPREHENSIVE METABOLIC PANEL
Albumin: 4.1 (ref 3.5–5.0)
Calcium: 9.3 (ref 8.7–10.7)
Globulin: 3.3

## 2021-07-07 LAB — HEMOGLOBIN A1C: Hemoglobin A1C: 9.7

## 2021-07-07 NOTE — Progress Notes (Signed)
[name] ([id number]) Visit Report for 07/06/2021 Arrival Information Details Patient Name: Date of Service: [name] 07/06/2021 12:30 [name] Medical Record Number: [id number] Patient Account Number: [id number] Date of Birth/Sex: Treating RN: 1950-12-01 (71 y.o. [name] Primary Care [name]: [name] [name]: Referring [name]: Treating [name]/Extender: [name] in Treatment: 12 Visit Information History Since Last Visit All ordered tests and consults were completed: Yes Patient Arrived: Ambulatory Added or deleted any medications: No Arrival Time: 12:42 Any new allergies or adverse reactions: No Transfer Assistance: None Had a fall or experienced change in No Patient Identification Verified: Yes activities of daily living that may affect Secondary Verification Process Completed: Yes risk of falls: Patient Requires Transmission-Based Precautions: No Signs or symptoms of abuse/neglect since last visito No Patient Has Alerts: Yes Hospitalized since last visit: No Patient Alerts: ABI's: 09/22 L:1.09 R: 1. Implantable device outside of the clinic excluding No TBI's: R:0.72 L:0.99 cellular tissue based products placed in the center since last visit: Has Dressing in Place as Prescribed: Yes Has Compression in Place as Prescribed: Yes Pain Present Now: Yes Electronic Signature(s) Signed: 07/06/2021 5:44:04 [name] By: Lorrin Jackson Entered By: Lorrin Jackson on 07/06/2021 12:45:39 -------------------------------------------------------------------------------- Compression Therapy Details Patient Name: Date of Service: [name]. 07/06/2021 12:30 [name] Medical Record Number: [id number] Patient Account Number: [id number] Date of Birth/Sex: Treating RN: 1950/09/02 (71 y.o. [name] Primary Care Keiondra Brookover: [name] [name]: Referring Caedan Sumler: Treating [name]/Extender: [name] in Treatment: 12 Compression Therapy Performed for Wound Assessment: Wound #18 Left,Plantar Metatarsal head first Performed By: [name] Lorrin Jackson, RN Compression Type: Double Layer Post Procedure Diagnosis Same as Pre-procedure Electronic Signature(s) Signed: 07/06/2021 5:44:04 [name] By: Lorrin Jackson Entered By: Lorrin Jackson on 07/06/2021 13:13:58 -------------------------------------------------------------------------------- Compression Therapy Details Patient Name: Date of Service: [name]. 07/06/2021 12:30 [name] Medical Record Number: [id number] Patient Account Number: [id number] Date of Birth/Sex: Treating RN: [birth year] (71 y.o. [name] Primary Care Dave Mergen: [name] [name]: Referring Tyleah Loh: Treating [name]/Extender: [name] in Treatment: 12 Compression Therapy Performed for Wound Assessment: Wound #19 Left,Anterior Lower Leg Performed By: [name] Lorrin Jackson, RN Compression Type: Double Layer Post Procedure Diagnosis Same as Pre-procedure Electronic Signature(s) Signed: 07/06/2021 5:44:04 [name] By: Lorrin Jackson Entered By: Lorrin Jackson on 07/06/2021 13:13:58 -------------------------------------------------------------------------------- Compression Therapy Details Patient Name: Date of Service: [name]. 07/06/2021 12:30 [name] Medical Record Number: [id number] Patient Account Number: [id number] Date of Birth/Sex: Treating RN: [birth year] (71 y.o. [name] Primary Care Mirtie Bastyr: [name] [name]: Referring Elmor Kost: Treating [name]/Extender: [name] in Treatment: 12 Compression Therapy Performed for Wound Assessment: Wound #22 Left,Lateral Lower Leg Performed By: [name] Lorrin Jackson, RN Compression Type: Double Layer Post Procedure Diagnosis Same as Pre-procedure Electronic Signature(s) Signed: 07/06/2021 5:44:04 [name] By:  Lorrin Jackson Entered By: Lorrin Jackson on 07/06/2021 13:13:58 -------------------------------------------------------------------------------- Encounter Discharge Information Details Patient Name: Date of Service: [name]. 07/06/2021 12:30 [name] Medical Record Number: [id number] Patient Account Number: [id number] Date of Birth/Sex: Treating RN: 06/28/1950 (71 y.o. [name] Primary Care Jaymon Dudek: [name] [name]: Referring [name]: Treating [name]/Extender: [name] in Treatment: 12 Encounter Discharge Information Items Discharge Condition: Stable Ambulatory Status: Ambulatory Discharge Destination: Home Transportation: Private Auto Schedule Follow-up Appointment: Yes Clinical Summary of Care: Provided on 07/06/2021 Form Type Recipient Paper Patient Patient Electronic Signature(s) Signed: 07/06/2021 2:38:01 [name] By: [name] By: Lorrin Jackson on 07/06/2021 14:38:01 -------------------------------------------------------------------------------- Lower Extremity Assessment Details Patient Name: Date of Service: [name] Medical Record Number: [id number] Patient Account Number: [id number] Date of Birth/Sex: Treating RN: 1951-03-12 (71 y.o. [name] Primary Care [name]: [name] [name]: Referring [name]: Treating [name]/Extender: [name] in Treatment: 12 Edema Assessment Assessed: [name]: Yes] [name]: No] Edema: [Left: Ye] [Right: s] Calf Left: Right: Point of Measurement: 41 cm From Medial Instep 45.2 cm Ankle Left: Right: Point of Measurement: 10 cm From Medial Instep 28.5 cm Vascular Assessment Pulses: Dorsalis Pedis Palpable: [Left:Yes] Electronic Signature(s) Signed: 07/06/2021 5:44:04 [name] By: Lorrin Jackson Entered By: Lorrin Jackson on 07/06/2021  12:56:12 -------------------------------------------------------------------------------- Multi Wound Chart Details Patient Name: Date of Service: [name]. 07/06/2021 12:30 [name] Medical Record Number: [id number] Patient Account Number: [id number] Date of Birth/Sex: Treating RN: 12/03/1950 (71 y.o. [name] Primary Care [name]: [name] [name]: Referring Clarissia Mckeen: Treating Waldon Sheerin/Extender: [name] in Treatment: 12 Vital Signs Height(in): 84 Capillary Blood Glucose(mg/dl): 78 Weight(lbs): 238 Pulse(bpm): 63 Body Mass Index(BMI): 31 Blood Pressure(mmHg): 156/81 Temperature(F): 98.3 Respiratory Rate(breaths/min): 18 Photos: Left, Plantar Metatarsal head first Left, Anterior Lower Leg Left, Lateral Lower Leg Wound Location: Gradually Appeared Gradually Appeared Bump Wounding Event: Diabetic Wound/Ulcer of the Lower Diabetic Wound/Ulcer of the Lower Cyst Primary Etiology: Extremity Extremity Glaucoma, Sleep Apnea, Glaucoma, Sleep Apnea, Glaucoma, Sleep Apnea, Comorbid History: Hypertension, Peripheral Arterial Hypertension, Peripheral Arterial Hypertension, Peripheral Arterial Disease, Peripheral Venous Disease, Disease, Peripheral Venous Disease, Disease, Peripheral Venous Disease, Type II Diabetes, Gout, Osteoarthritis, Type II Diabetes, Gout, Osteoarthritis, Type II Diabetes, Gout, Osteoarthritis, Neuropathy Neuropathy Neuropathy 08/23/2020 08/23/2020 06/03/2021 Date Acquired: _0 Weeks of Treatment: Open Open Open Wound Status: 1.8x2.6x0.1 0.6x0.3x0.1 3x2x1 Measurements L x W x D (cm) 3.676 0.141 4.712 A (cm) : rea 0.368 0.014 4.712 Volume (cm) : 74.00% 99.00% -185.70% % Reduction in Area: 87.00% 99.70% -257.20% % Reduction in Volume: Grade 2 Grade 2 Full Thickness With Exposed Support Classification: Structures Medium Medium Large Exudate Amount: Serosanguineous Serosanguineous Purulent Exudate  Type: red, brown red, brown yellow, brown, green Exudate Color: Distinct, outline attached Distinct, outline attached Distinct, outline attached Wound Margin: Large (67-100%) Large (67-100%) Large (67-100%) Granulation Amount: Red Red, Pink Red Granulation Quality: None Present (0%) None Present (0%) Small (1-33%) Necrotic Amount: Fat Layer (Subcutaneous Tissue): Yes Fat Layer (Subcutaneous Tissue): Yes Fat Layer (Subcutaneous Tissue): Yes Exposed Structures: Fascia: No Fascia: No Fascia: No Tendon: No Tendon: No Tendon: No Muscle: No Muscle: No Muscle: No Joint: No Joint: No Joint: No Bone: No Bone: No Bone: No None Large (67-100%) None Epithelialization: calloused periwound N/A N/A Assessment Notes: Compression Therapy Compression Therapy Compression Therapy Procedures Performed: Treatment Notes Electronic Signature(s) Signed: 07/06/2021 4:29:39 [name] By: Linton Ham MD Signed: 07/07/2021 1:40:06 [name] By: Deon Pilling RN, BSN Entered By: Linton Ham on 07/06/2021 13:36:14 -------------------------------------------------------------------------------- Multi-Disciplinary Care Plan Details Patient Name: Date of Service: [name]. 07/06/2021 12:30 [name] Medical Record Number: [id number] Patient Account Number: [id number] Date of Birth/Sex: Treating RN: 1950/11/07 (71 y.o. [name] Primary Care Era Parr: [name] [name]: Referring Zaviyar Rahal: Treating Jaquanna Ballentine/Extender: [name] in Treatment: 12 Multidisciplinary Care Plan reviewed with physician Active Inactive Soft Tissue Infection Nursing Diagnoses: Impaired tissue integrity Potential for infection: soft tissue Goals: Patient's soft tissue infection will resolve Date Initiated: 04/20/2021 Target Resolution Date: 07/21/2021 Goal Status: Active Interventions: Assess  signs and symptoms of infection every visit Provide education on infection Treatment  Activities: Culture : 04/20/2021 Education provided on Infection : 07/03/2021 Systemic antibiotics : 04/20/2021 T ordered outside of clinic : 04/20/2021 est Notes: Wound/Skin Impairment Nursing Diagnoses: Impaired tissue integrity Knowledge deficit related to ulceration/compromised skin integrity Goals: Patient will have a decrease in wound volume by X% from date: (specify in notes) Date Initiated: 04/12/2021 Date Inactivated: 04/27/2021 Target Resolution Date: 04/23/2021 Goal Status: Met Patient/caregiver will verbalize understanding of skin care regimen Date Initiated: 04/12/2021 Target Resolution Date: 07/21/2021 Goal Status: Active Ulcer/skin breakdown will have a volume reduction of 30% by week 4 Date Initiated: 04/12/2021 Date Inactivated: 04/27/2021 Target Resolution Date: 04/27/2021 Goal Status: Unmet Unmet Reason: infection Ulcer/skin breakdown will have a volume reduction of 50% by week 8 Date Initiated: 04/27/2021 Date Inactivated: 06/29/2021 Target Resolution Date: 06/24/2021 Goal Status: Met Interventions: Assess patient/caregiver ability to obtain necessary supplies Assess patient/caregiver ability to perform ulcer/skin care regimen upon admission and as needed Assess ulceration(s) every visit Notes: Electronic Signature(s) Signed: 07/06/2021 5:44:04 [name] By: Lorrin Jackson Entered By: Lorrin Jackson on 07/06/2021 12:42:02 -------------------------------------------------------------------------------- Pain Assessment Details Patient Name: Date of Service: [name]. 07/06/2021 12:30 [name] Medical Record Number: [id number] Patient Account Number: [id number] Date of Birth/Sex: Treating RN: 08/10/1950 (71 y.o. [name] Primary Care Denzel Etienne: [name] [name]: Referring Aleda Madl: Treating Jerrie Gullo/Extender: [name] in Treatment: 12 Active Problems Location of Pain Severity and Description of Pain Patient Has Paino  Yes Patient Has Paino Yes Site Locations Duration of the Pain. Constant / Intermittento Intermittent Rate the pain. Current Pain Level: 4 Character of Pain Describe the Pain: Tender, Throbbing Pain Management and Medication Current Pain Management: Medication: Yes Cold Application: No Rest: Yes Massage: No Activity: No T.E.N.S.: No Heat Application: No Leg drop or elevation: No Is the Current Pain Management Adequate: Adequate How does your wound impact your activities of daily livingo Sleep: No Bathing: No Appetite: No Relationship With Others: No Bladder Continence: No Emotions: No Bowel Continence: No Work: No Toileting: No Drive: No Dressing: No Hobbies: No Electronic Signature(s) Signed: 07/06/2021 5:44:04 [name] By: Lorrin Jackson Entered By: Lorrin Jackson on 07/06/2021 12:46:22 -------------------------------------------------------------------------------- Patient/Caregiver Education Details Patient Name: Date of Service: [name] 1/12/2023andnbsp12:30 [name] Medical Record Number: [id number] Patient Account Number: [id number] Date of Birth/Gender: Treating RN: [birth year] (71 y.o. [name] Primary Care Physician: [name] [name]: Referring Physician: Treating Physician/Extender: [name] in Treatment: 12 Education Assessment Education Provided To: Patient Education Topics Provided Venous: Methods: Explain/Verbal, Printed Responses: State content correctly Wound/Skin Impairment: Methods: Explain/Verbal, Printed Responses: State content correctly Electronic Signature(s) Signed: 07/06/2021 5:44:04 [name] By: Lorrin Jackson Entered By: Lorrin Jackson on 07/06/2021 12:42:25 -------------------------------------------------------------------------------- Wound Assessment Details Patient Name: Date of Service: [name]. 07/06/2021 12:30 [name] Medical Record Number: [id number] Patient Account Number:  [id number] Date of Birth/Sex: Treating RN: Oct 27, 1950 (72 y.o. [name] Primary Care Caydyn Sprung: [name] [name]: Referring Mitch Arquette: Treating Hazaiah Edgecombe/Extender: [name] in Treatment: 12 Wound Status Wound Number: 18 Primary Diabetic Wound/Ulcer of the Lower Extremity Etiology: Wound Location: Left, Plantar Metatarsal head first Wound Open Wounding Event: Gradually Appeared Status: Date Acquired: 08/23/2020 Comorbid Glaucoma, Sleep Apnea, Hypertension, Peripheral Arterial Disease, Weeks Of Treatment: 12 History: Peripheral Venous Disease, Type II Diabetes, Gout, Osteoarthritis, Clustered Wound: No Neuropathy Photos Wound Measurements Length: (cm) 1.8 Width: (cm) 2.6 Depth: (cm) 0.1 Area: (cm) 3.676 Volume: (  cm) 0.368 % Reduction in Area: 74% % Reduction in Volume: 87% Epithelialization: None Tunneling: No Undermining: No Wound Description Classification: Grade 2 Wound Margin: Distinct, outline attached Exudate Amount: Medium Exudate Type: Serosanguineous Exudate Color: red, brown Foul Odor After Cleansing: No Slough/Fibrino No Wound Bed Granulation Amount: Large (67-100%) Exposed Structure Granulation Quality: Red Fascia Exposed: No Necrotic Amount: None Present (0%) Fat Layer (Subcutaneous Tissue) Exposed: Yes Tendon Exposed: No Muscle Exposed: No Joint Exposed: No Bone Exposed: No Assessment Notes calloused periwound Treatment Notes Wound #18 (Metatarsal head first) Wound Laterality: Plantar, Left Cleanser Soap and Water Discharge Instruction: May shower and wash wound with dial antibacterial soap and water prior to dressing change. Wound Cleanser Discharge Instruction: Cleanse the wound with wound cleanser prior to applying a clean dressing using gauze sponges, not tissue or cotton balls. Peri-Wound Care Triamcinolone 15 (g) Discharge Instruction: Use triamcinolone 15 (g) as directed Zinc Oxide Ointment  30g tube Discharge Instruction: Apply Zinc Oxide to periwound with each dressing change Sween Lotion (Moisturizing lotion) Discharge Instruction: Apply moisturizing lotion as directed Topical Primary Dressing Hydrofera Blue Classic Foam, 4x4 in Discharge Instruction: Moisten with saline prior to applying to wound bed Secondary Dressing ABD Pad, 5x9 Discharge Instruction: Apply over primary dressing as directed. Zetuvit Plus 4x8 in Discharge Instruction: Apply over primary dressing as directed. CarboFLEX Odor Control Dressing, 4x4 in Discharge Instruction: Apply over primary dressing as directed. Secured With Compression Wrap CoFlex TLC XL 2-layer Compression System 4x7 (in/yd) Discharge Instruction: Apply CoFlex 2-layer compression as directed. (alt for 4 layer) Compression Stockings Add-Ons Electronic Signature(s) Signed: 07/06/2021 5:44:04 [name] By: Lorrin Jackson Entered By: Lorrin Jackson on 07/06/2021 13:03:38 -------------------------------------------------------------------------------- Wound Assessment Details Patient Name: Date of Service: [name]. 07/06/2021 12:30 [name] Medical Record Number: [id number] Patient Account Number: [id number] Date of Birth/Sex: Treating RN: [birth year] (71 y.o. [name] Primary Care Marshe Shrestha: [name] [name]: Referring Renaud Celli: Treating Emma-Lee Oddo/Extender: [name] in Treatment: 12 Wound Status Wound Number: 19 Primary Diabetic Wound/Ulcer of the Lower Extremity Etiology: Wound Location: Left, Anterior Lower Leg Wound Open Wounding Event: Gradually Appeared Status: Date Acquired: 08/23/2020 Comorbid Glaucoma, Sleep Apnea, Hypertension, Peripheral Arterial Disease, Weeks Of Treatment: 12 History: Peripheral Venous Disease, Type II Diabetes, Gout, Osteoarthritis, Clustered Wound: No Neuropathy Photos Wound Measurements Length: (cm) 0.6 Width: (cm) 0.3 Depth: (cm) 0.1 Area: (cm)  0.141 Volume: (cm) 0.014 % Reduction in Area: 99% % Reduction in Volume: 99.7% Epithelialization: Large (67-100%) Tunneling: No Undermining: No Wound Description Classification: Grade 2 Wound Margin: Distinct, outline attached Exudate Amount: Medium Exudate Type: Serosanguineous Exudate Color: red, brown Foul Odor After Cleansing: No Slough/Fibrino No Wound Bed Granulation Amount: Large (67-100%) Exposed Structure Granulation Quality: Red, Pink Fascia Exposed: No Necrotic Amount: None Present (0%) Fat Layer (Subcutaneous Tissue) Exposed: Yes Tendon Exposed: No Muscle Exposed: No Joint Exposed: No Bone Exposed: No Treatment Notes Wound #19 (Lower Leg) Wound Laterality: Left, Anterior Cleanser Soap and Water Discharge Instruction: May shower and wash wound with dial antibacterial soap and water prior to dressing change. Wound Cleanser Discharge Instruction: Cleanse the wound with wound cleanser prior to applying a clean dressing using gauze sponges, not tissue or cotton balls. Peri-Wound Care Triamcinolone 15 (g) Discharge Instruction: Use triamcinolone 15 (g) as directed Zinc Oxide Ointment 30g tube Discharge Instruction: Apply Zinc Oxide to periwound with each dressing change Sween Lotion (Moisturizing lotion) Discharge Instruction: Apply moisturizing lotion as directed Topical Primary Dressing Hydrofera Blue Classic Foam, 4x4 in Discharge Instruction:  Moisten with saline prior to applying to wound bed Secondary Dressing ABD Pad, 5x9 Discharge Instruction: Apply over primary dressing as directed. Zetuvit Plus 4x8 in Discharge Instruction: Apply over primary dressing as directed. CarboFLEX Odor Control Dressing, 4x4 in Discharge Instruction: Apply over primary dressing as directed. Secured With Compression Wrap CoFlex TLC XL 2-layer Compression System 4x7 (in/yd) Discharge Instruction: Apply CoFlex 2-layer compression as directed. (alt for 4 layer) Compression  Stockings Add-Ons Electronic Signature(s) Signed: 07/06/2021 5:44:04 [name] By: Lorrin Jackson Entered By: Lorrin Jackson on 07/06/2021 13:04:45 -------------------------------------------------------------------------------- Wound Assessment Details Patient Name: Date of Service: [name]. 07/06/2021 12:30 [name] Medical Record Number: [id number] Patient Account Number: [id number] Date of Birth/Sex: Treating RN: 06-Nov-1950 (71 y.o. [name] Primary Care Clay Solum: [name] [name]: Referring Wen Merced: Treating Elton Catalano/Extender: [name] in Treatment: 12 Wound Status Wound Number: 22 Primary Cyst Etiology: Wound Location: Left, Lateral Lower Leg Wound Open Wounding Event: Bump Status: Date Acquired: 06/03/2021 Comorbid Glaucoma, Sleep Apnea, Hypertension, Peripheral Arterial Disease, Weeks Of Treatment: 4 History: Peripheral Venous Disease, Type II Diabetes, Gout, Osteoarthritis, Clustered Wound: No Neuropathy Photos Wound Measurements Length: (cm) 3 Width: (cm) 2 Depth: (cm) 1 Area: (cm) 4.712 Volume: (cm) 4.712 % Reduction in Area: -185.7% % Reduction in Volume: -257.2% Epithelialization: None Tunneling: No Undermining: No Wound Description Classification: Full Thickness With Exposed Support Structures Wound Margin: Distinct, outline attached Exudate Amount: Large Exudate Type: Purulent Exudate Color: yellow, brown, green Foul Odor After Cleansing: No Slough/Fibrino Yes Wound Bed Granulation Amount: Large (67-100%) Exposed Structure Granulation Quality: Red Fascia Exposed: No Necrotic Amount: Small (1-33%) Fat Layer (Subcutaneous Tissue) Exposed: Yes Necrotic Quality: Adherent Slough Tendon Exposed: No Muscle Exposed: No Joint Exposed: No Bone Exposed: No Treatment Notes Wound #22 (Lower Leg) Wound Laterality: Left, Lateral Cleanser Soap and Water Discharge Instruction: May shower and wash wound with dial  antibacterial soap and water prior to dressing change. Wound Cleanser Discharge Instruction: Cleanse the wound with wound cleanser prior to applying a clean dressing using gauze sponges, not tissue or cotton balls. Peri-Wound Care Triamcinolone 15 (g) Discharge Instruction: Use triamcinolone 15 (g) as directed Zinc Oxide Ointment 30g tube Discharge Instruction: Apply Zinc Oxide to periwound with each dressing change Sween Lotion (Moisturizing lotion) Discharge Instruction: Apply moisturizing lotion as directed Topical Primary Dressing KerraCel Ag Gelling Fiber Dressing, 4x5 in (silver alginate) Discharge Instruction: Apply silver alginate to wound bed as instructed Secondary Dressing ABD Pad, 5x9 Discharge Instruction: Apply over primary dressing as directed. Zetuvit Plus 4x8 in Discharge Instruction: Apply over primary dressing as directed. CarboFLEX Odor Control Dressing, 4x4 in Discharge Instruction: Apply over primary dressing as directed. Secured With Compression Wrap CoFlex TLC XL 2-layer Compression System 4x7 (in/yd) Discharge Instruction: Apply CoFlex 2-layer compression as directed. (alt for 4 layer) Compression Stockings Add-Ons Electronic Signature(s) Signed: 07/06/2021 5:44:04 [name] By: Lorrin Jackson Entered By: Lorrin Jackson on 07/06/2021 13:05:31 -------------------------------------------------------------------------------- Vitals Details Patient Name: Date of Service: [name]. 07/06/2021 12:30 [name] Medical Record Number: [id number] Patient Account Number: [id number] Date of Birth/Sex: Treating RN: 1950-12-14 (71 y.o. [name] Primary Care Miryah Ralls: [name] [name]: Referring Tyheim Vanalstyne: Treating Analyn Matusek/Extender: [name] in Treatment: 12 Vital Signs Time Taken: 12:48 Temperature (F): 98.3 Height (in): 74 Pulse (bpm): 63 Weight (lbs): 238 Respiratory Rate (breaths/min): 18 Body Mass Index (BMI):  30.6 Blood Pressure (mmHg): 156/81 Capillary Blood Glucose (mg/dl): 88 Reference Range: 80 - 120 mg / dl Electronic Signature(s) Signed:  07/06/2021 5:44:04 [name] By: Lorrin Jackson Entered By: Lorrin Jackson on 07/06/2021 12:48:26

## 2021-07-10 ENCOUNTER — Encounter (HOSPITAL_BASED_OUTPATIENT_CLINIC_OR_DEPARTMENT_OTHER): Payer: Medicare Other | Admitting: Internal Medicine

## 2021-07-10 ENCOUNTER — Other Ambulatory Visit: Payer: Self-pay

## 2021-07-10 DIAGNOSIS — E11621 Type 2 diabetes mellitus with foot ulcer: Secondary | ICD-10-CM | POA: Diagnosis not present

## 2021-07-11 NOTE — Progress Notes (Signed)
ANGELES, PAOLUCCI ([id number]) Visit Report for 07/10/2021 SuperBill Details Patient Name: Date of Service: KELSON, QUEENAN 07/10/2021 Medical Record Number: [id number] Patient Account Number: [id number] Date of Birth/Sex: Treating RN: 1951/05/21 (71 y.o. 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

## 2021-07-11 NOTE — Progress Notes (Signed)
MACARIO, SHEAR ([id number]) Visit Report for 07/10/2021 Arrival Information Details Patient Name: Date of Service: [name], [name] 07/10/2021 9:00 A M Medical Record Number: [id number] Patient Account Number: [id number] Date of Birth/Sex: Treating RN: [name] (71 y.o. [name] Primary Care Hinda Lindor: [name] Other Clinician: Referring Sharmeka Palmisano: Treating Winni Ehrhard/Extender: Cornelious Bryant in Treatment: 12 Visit Information History Since Last Visit Added or deleted any medications: No Patient Arrived: Ambulatory Any new allergies or adverse reactions: No Arrival Time: 09:28 Had a fall or experienced change in No Accompanied By: self activities of daily living that may affect Transfer Assistance: None risk of falls: Patient Identification Verified: Yes Signs or symptoms of abuse/neglect since last visito No Secondary Verification Process Completed: Yes Hospitalized since last visit: No Patient Requires Transmission-Based Precautions: No Implantable device outside of the clinic excluding No Patient Has Alerts: Yes cellular tissue based products placed in the center Patient Alerts: ABI's: 09/22 L:1.09 R: 1. since last visit: TBI's: R:0.72 L:0.99 Has Dressing in Place as Prescribed: Yes Pain Present Now: Yes Electronic Signature(s) Signed: 07/10/2021 9:58:04 AM By: Sandre Kitty Entered By: Sandre Kitty on 07/10/2021 09:28:47 -------------------------------------------------------------------------------- Compression Therapy Details Patient Name: Date of Service: [name]. 07/10/2021 9:00 A M Medical Record Number: [id number] Patient Account Number: [id number] Date of Birth/Sex: Treating RN: [birth year] (71 y.o. [name] Primary Care Nida Manfredi: [name] Other Clinician: Referring Burnette Sautter: Treating Belladonna Lubinski/Extender: Cornelious Bryant in Treatment: 12 Compression Therapy Performed for Wound  Assessment: Wound #18 Left,Plantar Metatarsal head first Performed By: Clinician Dellie Catholic, RN Compression Type: Double Layer Electronic Signature(s) Signed: 07/11/2021 3:29:02 PM By: Dellie Catholic RN Entered By: Dellie Catholic on 07/10/2021 17:22:39 -------------------------------------------------------------------------------- Compression Therapy Details Patient Name: Date of Service: [name]. 07/10/2021 9:00 A M Medical Record Number: [id number] Patient Account Number: [id number] Date of Birth/Sex: Treating RN: [name] (72 y.o. [name] Primary Care Annali Lybrand: Other Clinician: [name] Referring Maziah Smola: Treating Pragya Lofaso/Extender: Cornelious Bryant in Treatment: 12 Compression Therapy Performed for Wound Assessment: Wound #22 Left,Lateral Lower Leg Performed By: Clinician Dellie Catholic, RN Compression Type: Double Layer Electronic Signature(s) Signed: 07/11/2021 3:29:02 PM By: Dellie Catholic RN Entered By: Dellie Catholic on 07/10/2021 17:22:39 -------------------------------------------------------------------------------- Compression Therapy Details Patient Name: Date of Service: [name]. 07/10/2021 9:00 A M Medical Record Number: [id number] Patient Account Number: [id number] Date of Birth/Sex: Treating RN: [name] (71 y.o. [name] Primary Care Nicklous Aburto: [name] Other Clinician: Referring Reeshemah Nazaryan: Treating Dafne Nield/Extender: Cornelious Bryant in Treatment: 12 Compression Therapy Performed for Wound Assessment: Wound #19 Left,Anterior Lower Leg Performed By: Clinician Dellie Catholic, RN Compression Type: Double Layer Electronic Signature(s) Signed: 07/11/2021 3:29:02 PM By: Dellie Catholic RN Entered By: Dellie Catholic on 07/10/2021 17:22:39 -------------------------------------------------------------------------------- Encounter Discharge Information Details Patient  Name: Date of Service: [name]. 07/10/2021 9:00 A M Medical Record Number: [id number] Patient Account Number: [id number] Date of Birth/Sex: Treating RN: [name] (71 y.o. [name] Primary Care [name]: [name] Other Clinician: Referring [name]: Treating [name]/Extender: Cornelious Bryant in Treatment: 12 Encounter Discharge Information Items Discharge Condition: Stable Ambulatory Status: Ambulatory Discharge Destination: Home Transportation: Private Auto Accompanied By: self Schedule Follow-up Appointment: No Clinical Summary of Care: Patient Declined Electronic Signature(s) Signed: 07/11/2021 3:29:02 PM By: Dellie Catholic RN Entered By: Dellie Catholic on 07/10/2021 17:24:23 -------------------------------------------------------------------------------- Patient/Caregiver Education Details Patient Name: Date of Service: [name] 1/16/2023andnbsp9:00 [name]  Record Number: [id number] Patient Account Number: [id number] Date of Birth/Gender: Treating RN: [name] (71 y.o. [name] Primary Care Physician: [name] Other Clinician: Referring Physician: Treating Physician/Extender: Cornelious Bryant in Treatment: 12 Education Assessment Education Provided To: Patient Education Topics Provided Infection: Methods: Explain/Verbal Responses: Return demonstration correctly Electronic Signature(s) Signed: 07/11/2021 3:29:02 PM By: Dellie Catholic RN Entered By: Dellie Catholic on 07/10/2021 17:23:38 -------------------------------------------------------------------------------- Wound Assessment Details Patient Name: Date of Service: [name]. 07/10/2021 9:00 A M Medical Record Number: [id number] Patient Account Number: [id number] Date of Birth/Sex: Treating RN: [name] (71 y.o. [name] Primary Care [name]: [name] Other Clinician: Referring  [name]: Treating [name]/Extender: Cornelious Bryant in Treatment: 12 Wound Status Wound Number: 18 Primary Etiology: Diabetic Wound/Ulcer of the Lower Extremity Wound Location: Left, Plantar Metatarsal head first Wound Status: Open Wounding Event: Gradually Appeared Date Acquired: 08/23/2020 Weeks Of Treatment: 12 Clustered Wound: No Wound Measurements Length: (cm) 1.8 Width: (cm) 2.6 Depth: (cm) 0.1 Area: (cm) 3.676 Volume: (cm) 0.368 % Reduction in Area: 74% % Reduction in Volume: 87% Wound Description Classification: Grade 2 Exudate Amount: Medium Exudate Type: Serosanguineous Exudate Color: red, brown Treatment Notes Wound #18 (Metatarsal head first) Wound Laterality: Plantar, Left Cleanser Soap and Water Discharge Instruction: May shower and wash wound with dial antibacterial soap and water prior to dressing change. Wound Cleanser Discharge Instruction: Cleanse the wound with wound cleanser prior to applying a clean dressing using gauze sponges, not tissue or cotton balls. Peri-Wound Care Triamcinolone 15 (g) Discharge Instruction: Use triamcinolone 15 (g) as directed Zinc Oxide Ointment 30g tube Discharge Instruction: Apply Zinc Oxide to periwound with each dressing change Sween Lotion (Moisturizing lotion) Discharge Instruction: Apply moisturizing lotion as directed Topical Primary Dressing Hydrofera Blue Classic Foam, 4x4 in Discharge Instruction: Moisten with saline prior to applying to wound bed Secondary Dressing ABD Pad, 5x9 Discharge Instruction: Apply over primary dressing as directed. Zetuvit Plus 4x8 in Discharge Instruction: Apply over primary dressing as directed. CarboFLEX Odor Control Dressing, 4x4 in Discharge Instruction: Apply over primary dressing as directed. Secured With Compression Wrap CoFlex TLC XL 2-layer Compression System 4x7 (in/yd) Discharge Instruction: Apply CoFlex 2-layer compression as directed. (alt  for 4 layer) Compression Stockings Add-Ons Electronic Signature(s) Signed: 07/10/2021 9:58:04 AM By: Sandre Kitty Signed: 07/11/2021 3:58:04 PM By: Lorrin Jackson Entered By: Sandre Kitty on 07/10/2021 09:30:08 -------------------------------------------------------------------------------- Wound Assessment Details Patient Name: Date of Service: [name]. 07/10/2021 9:00 A M Medical Record Number: [id number] Patient Account Number: [id number] Date of Birth/Sex: Treating RN: [name] (72 y.o. [name] Primary Care Aldred Mase: [name] Other Clinician: Referring Lester Crickenberger: Treating Vinnie Gombert/Extender: Cornelious Bryant in Treatment: 12 Wound Status Wound Number: 19 Primary Etiology: Diabetic Wound/Ulcer of the Lower Extremity Wound Location: Left, Anterior Lower Leg Wound Status: Open Wounding Event: Gradually Appeared Date Acquired: 08/23/2020 Weeks Of Treatment: 12 Clustered Wound: No Wound Measurements Length: (cm) 0.6 Width: (cm) 0.3 Depth: (cm) 0.1 Area: (cm) 0.141 Volume: (cm) 0.014 % Reduction in Area: 99% % Reduction in Volume: 99.7% Wound Description Classification: Grade 2 Exudate Amount: Medium Exudate Type: Serosanguineous Exudate Color: red, brown Treatment Notes Wound #19 (Lower Leg) Wound Laterality: Left, Anterior Cleanser Soap and Water Discharge Instruction: May shower and wash wound with dial antibacterial soap and water prior to dressing change. Wound Cleanser Discharge Instruction: Cleanse the wound with wound cleanser prior to applying a clean dressing using gauze sponges, not tissue or cotton balls. Peri-Wound  Care Triamcinolone 15 (g) Discharge Instruction: Use triamcinolone 15 (g) as directed Zinc Oxide Ointment 30g tube Discharge Instruction: Apply Zinc Oxide to periwound with each dressing change Sween Lotion (Moisturizing lotion) Discharge Instruction: Apply moisturizing lotion as  directed Topical Primary Dressing Hydrofera Blue Classic Foam, 4x4 in Discharge Instruction: Moisten with saline prior to applying to wound bed Secondary Dressing ABD Pad, 5x9 Discharge Instruction: Apply over primary dressing as directed. Zetuvit Plus 4x8 in Discharge Instruction: Apply over primary dressing as directed. CarboFLEX Odor Control Dressing, 4x4 in Discharge Instruction: Apply over primary dressing as directed. Secured With Compression Wrap CoFlex TLC XL 2-layer Compression System 4x7 (in/yd) Discharge Instruction: Apply CoFlex 2-layer compression as directed. (alt for 4 layer) Compression Stockings Add-Ons Electronic Signature(s) Signed: 07/10/2021 9:58:04 AM By: Sandre Kitty Signed: 07/11/2021 3:58:04 PM By: Lorrin Jackson Entered By: Sandre Kitty on 07/10/2021 09:30:09 -------------------------------------------------------------------------------- Wound Assessment Details Patient Name: Date of Service: [name]. 07/10/2021 9:00 A M Medical Record Number: [id number] Patient Account Number: [id number] Date of Birth/Sex: Treating RN: [name] (71 y.o. [name] Primary Care Cyerra Yim: [name] Other Clinician: Referring Radwan Cowley: Treating Azuree Minish/Extender: Cornelious Bryant in Treatment: 12 Wound Status Wound Number: 22 Primary Etiology: Cyst Wound Location: Left, Lateral Lower Leg Wound Status: Open Wounding Event: Bump Date Acquired: 06/03/2021 Weeks Of Treatment: 5 Clustered Wound: No Wound Measurements Length: (cm) 3 Width: (cm) 2 Depth: (cm) 1 Area: (cm) 4.712 Volume: (cm) 4.712 % Reduction in Area: -185.7% % Reduction in Volume: -257.2% Wound Description Classification: Full Thickness With Exposed Support Structure Exudate Amount: Large Exudate Type: Purulent Exudate Color: yellow, brown, green s Treatment Notes Wound #22 (Lower Leg) Wound Laterality: Left, Lateral Cleanser Soap and  Water Discharge Instruction: May shower and wash wound with dial antibacterial soap and water prior to dressing change. Wound Cleanser Discharge Instruction: Cleanse the wound with wound cleanser prior to applying a clean dressing using gauze sponges, not tissue or cotton balls. Peri-Wound Care Triamcinolone 15 (g) Discharge Instruction: Use triamcinolone 15 (g) as directed Zinc Oxide Ointment 30g tube Discharge Instruction: Apply Zinc Oxide to periwound with each dressing change Sween Lotion (Moisturizing lotion) Discharge Instruction: Apply moisturizing lotion as directed Topical Primary Dressing KerraCel Ag Gelling Fiber Dressing, 4x5 in (silver alginate) Discharge Instruction: Apply silver alginate to wound bed as instructed Secondary Dressing ABD Pad, 5x9 Discharge Instruction: Apply over primary dressing as directed. Zetuvit Plus 4x8 in Discharge Instruction: Apply over primary dressing as directed. CarboFLEX Odor Control Dressing, 4x4 in Discharge Instruction: Apply over primary dressing as directed. Secured With Compression Wrap CoFlex TLC XL 2-layer Compression System 4x7 (in/yd) Discharge Instruction: Apply CoFlex 2-layer compression as directed. (alt for 4 layer) Compression Stockings Add-Ons Electronic Signature(s) Signed: 07/10/2021 9:58:04 AM By: Sandre Kitty Signed: 07/11/2021 3:58:04 PM By: Lorrin Jackson Entered By: Sandre Kitty on 07/10/2021 09:30:09 -------------------------------------------------------------------------------- Vitals Details Patient Name: Date of Service: [name]. 07/10/2021 9:00 A M Medical Record Number: [id number] Patient Account Number: [id number] Date of Birth/Sex: Treating RN: [name] (71 y.o. [name] Primary Care Texie Tupou: [name] Other Clinician: Referring Ara Grandmaison: Treating Pailynn Vahey/Extender: Cornelious Bryant in Treatment: 12 Vital Signs Time Taken: 09:29 Temperature (F):  98.5 Height (in): 74 Pulse (bpm): 65 Weight (lbs): 238 Respiratory Rate (breaths/min): 18 Body Mass Index (BMI): 30.6 Blood Pressure (mmHg): 159/81 Capillary Blood Glucose (mg/dl): 151 Reference Range: 80 - 120 mg / dl Electronic Signature(s) Signed: 07/10/2021 9:58:04 AM By: Sandre Kitty Entered By: Sandre Kitty  on 07/10/2021 09:29:45

## 2021-07-13 ENCOUNTER — Other Ambulatory Visit: Payer: Self-pay

## 2021-07-13 ENCOUNTER — Encounter (HOSPITAL_BASED_OUTPATIENT_CLINIC_OR_DEPARTMENT_OTHER): Payer: Medicare Other | Admitting: Internal Medicine

## 2021-07-13 DIAGNOSIS — E11621 Type 2 diabetes mellitus with foot ulcer: Secondary | ICD-10-CM | POA: Diagnosis not present

## 2021-07-13 NOTE — Progress Notes (Signed)
[name], [name] ([id number]) Visit Report for 07/13/2021 Arrival Information Details Patient Name: Date of Service: [name], [name] 07/13/2021 2:00 PM Medical Record Number: [id number] Patient Account Number: [id number] Date of Birth/Sex: Treating RN: 06-17-1951 (71 y.o. [name], [name] Primary Care Burnis Kaser: [name] Other Clinician: Referring Charels Stambaugh: Treating Dennie Moltz/Extender: Stormy Card in Treatment: 33 Visit Information History Since Last Visit Added or deleted any medications: No Patient Arrived: Ambulatory Any new allergies or adverse reactions: No Arrival Time: 14:05 Had a fall or experienced change in No Accompanied By: self activities of daily living that may affect Transfer Assistance: None risk of falls: Patient Identification Verified: Yes Signs or symptoms of abuse/neglect since last visito No Secondary Verification Process Completed: Yes Hospitalized since last visit: No Patient Requires Transmission-Based Precautions: No Implantable device outside of the clinic excluding No Patient Has Alerts: Yes cellular tissue based products placed in the center Patient Alerts: ABI's: 09/22 L:1.09 R: 1. since last visit: TBI's: R:0.72 L:0.99 Has Dressing in Place as Prescribed: Yes Pain Present Now: Yes Electronic Signature(s) Signed: 07/13/2021 5:47:51 PM By: Rhae Hammock RN Entered By: Rhae Hammock on 07/13/2021 14:07:16 -------------------------------------------------------------------------------- Compression Therapy Details Patient Name: Date of Service: [name]. 07/13/2021 2:00 PM Medical Record Number: [id number] Patient Account Number: [id number] Date of Birth/Sex: Treating RN: 30-Jul-1950 (71 y.o. [name], [name] Primary Care Judine Arciniega: [name] Other Clinician: Referring Kazandra Forstrom: Treating Reyann Troop/Extender: Stormy Card in Treatment: 13 Compression Therapy Performed for Wound  Assessment: Wound #18 Left,Plantar Metatarsal head first Performed By: Clinician Rhae Hammock, RN Compression Type: Four Layer Post Procedure Diagnosis Same as Pre-procedure Electronic Signature(s) Signed: 07/13/2021 5:47:51 PM By: Rhae Hammock RN Entered By: Rhae Hammock on 07/13/2021 14:46:32 -------------------------------------------------------------------------------- Compression Therapy Details Patient Name: Date of Service: [name]. 07/13/2021 2:00 PM Medical Record Number: [id number] Patient Account Number: [id number] Date of Birth/Sex: Treating RN: [birth year] (71 y.o. [name], [name] Primary Care Abshir Paolini: [name] Other Clinician: Referring Yasamin Karel: Treating Maghan Jessee/Extender: Stormy Card in Treatment: 13 Compression Therapy Performed for Wound Assessment: Wound #19 Left,Anterior Lower Leg Performed By: Clinician Rhae Hammock, RN Compression Type: Four Layer Post Procedure Diagnosis Same as Pre-procedure Electronic Signature(s) Signed: 07/13/2021 5:47:51 PM By: Rhae Hammock RN Entered By: Rhae Hammock on 07/13/2021 14:46:32 -------------------------------------------------------------------------------- Compression Therapy Details Patient Name: Date of Service: [name]. 07/13/2021 2:00 PM Medical Record Number: [id number] Patient Account Number: [id number] Date of Birth/Sex: Treating RN: 1951-03-11 (71 y.o. [name], [name] Primary Care Lashonta Pilling: [name] Other Clinician: Referring Riggin Cuttino: Treating Humzah Harty/Extender: Stormy Card in Treatment: 13 Compression Therapy Performed for Wound Assessment: Wound #22 Left,Lateral Lower Leg Performed By: Clinician Rhae Hammock, RN Compression Type: Four Layer Post Procedure Diagnosis Same as Pre-procedure Electronic Signature(s) Signed: 07/13/2021 5:47:51 PM By: Rhae Hammock RN Entered By: Rhae Hammock on 07/13/2021 14:46:32 -------------------------------------------------------------------------------- Encounter Discharge Information Details Patient Name: Date of Service: [name]. 07/13/2021 2:00 PM Medical Record Number: [id number] Patient Account Number: [id number] Date of Birth/Sex: Treating RN: [birth year] (71 y.o. [name], [name] Primary Care Abraham Margulies: [name] Other Clinician: Referring Freddy Spadafora: Treating Baani Bober/Extender: Stormy Card in Treatment: 13 Encounter Discharge Information Items Discharge Condition: Stable Ambulatory Status: Ambulatory Discharge Destination: Home Transportation: Private Auto Accompanied By: self Schedule Follow-up Appointment: Yes Clinical Summary of Care: Patient Declined Electronic Signature(s) Signed: 07/13/2021 5:47:51 PM By: Rhae Hammock RN Entered By: Rhae Hammock on 07/13/2021 15:31:41 -------------------------------------------------------------------------------- Lower Extremity  Assessment Details Patient Name: Date of Service: [name], [name] 07/13/2021 2:00 PM Medical Record Number: [id number] Patient Account Number: [id number] Date of Birth/Sex: Treating RN: [birth year] (71 y.o. [name], [name] Primary Care Franciso Dierks: [name] Other Clinician: Referring Glenn Gullickson: Treating Onedia Vargus/Extender: Stormy Card in Treatment: 13 Edema Assessment Assessed: [name]: Yes] [name]: No] Edema: [Left: Ye] [Right: s] Calf Left: Right: Point of Measurement: 41 cm From Medial Instep 45.2 cm Ankle Left: Right: Point of Measurement: 10 cm From Medial Instep 28.5 cm Vascular Assessment Pulses: Dorsalis Pedis Palpable: [Left:Yes] Posterior Tibial Palpable: [Left:Yes] Electronic Signature(s) Signed: 07/13/2021 5:47:51 PM By: Rhae Hammock RN Entered By: Rhae Hammock on 07/13/2021  14:23:52 -------------------------------------------------------------------------------- Multi Wound Chart Details Patient Name: Date of Service: [name]. 07/13/2021 2:00 PM Medical Record Number: [id number] Patient Account Number: [id number] Date of Birth/Sex: Treating RN: 1951/02/27 (71 y.o. [name], [name] Primary Care Yaris Ferrell: [name] Other Clinician: Referring Raeshawn Tafolla: Treating Marianita Botkin/Extender: Stormy Card in Treatment: 13 Vital Signs Height(in): 65 Capillary Blood Glucose(mg/dl): 167 Weight(lbs): 238 Pulse(bpm): 76 Body Mass Index(BMI): 31 Blood Pressure(mmHg): 134/74 Temperature(F): 98.7 Respiratory Rate(breaths/min): 17 Photos: Left, Plantar Metatarsal head first Left, Anterior Lower Leg Left, Lateral Lower Leg Wound Location: Gradually Appeared Gradually Appeared Bump Wounding Event: Diabetic Wound/Ulcer of the Lower Diabetic Wound/Ulcer of the Lower Cyst Primary Etiology: Extremity Extremity Glaucoma, Sleep Apnea, Glaucoma, Sleep Apnea, Glaucoma, Sleep Apnea, Comorbid History: Hypertension, Peripheral Arterial Hypertension, Peripheral Arterial Hypertension, Peripheral Arterial Disease, Peripheral Venous Disease, Disease, Peripheral Venous Disease, Disease, Peripheral Venous Disease, Type II Diabetes, Gout, Osteoarthritis, Type II Diabetes, Gout, Osteoarthritis, Type II Diabetes, Gout, Osteoarthritis, Neuropathy Neuropathy Neuropathy 08/23/2020 08/23/2020 06/03/2021 Date Acquired: _0 Weeks of Treatment: Open Open Open Wound Status: 1.7x2.4x0.1 0.2x0.2x0.2 3.4x2.4x1 Measurements L x W x D (cm) 3.204 0.031 6.409 A (cm) : rea 0.32 0.006 6.409 Volume (cm) : 77.30% 99.80% -288.70% % Reduction in A rea: 88.70% 99.90% -385.90% % Reduction in Volume: 12 Position 1 (o'clock): 15 Maximum Distance 1 (cm): 10 Starting Position 1 (o'clock): 2 Ending Position 1 (o'clock): 2 Maximum Distance 1 (cm): No No  Yes Tunneling: No No Yes Undermining: Grade 2 Grade 2 Full Thickness With Exposed Support Classification: Structures Medium Medium Large Exudate Amount: Serosanguineous Serosanguineous Purulent Exudate Type: red, brown red, brown yellow, brown, green Exudate Color: Distinct, outline attached Distinct, outline attached Distinct, outline attached Wound Margin: Large (67-100%) Large (67-100%) Medium (34-66%) Granulation Amount: Red, Pink Red, Pink Red, Pink Granulation Quality: None Present (0%) None Present (0%) Medium (34-66%) Necrotic Amount: Fascia: No Fascia: No Fat Layer (Subcutaneous Tissue): Yes Exposed Structures: Fat Layer (Subcutaneous Tissue): No Fat Layer (Subcutaneous Tissue): No Fascia: No Tendon: No Tendon: No Tendon: No Muscle: No Muscle: No Muscle: No Joint: No Joint: No Joint: No Bone: No Bone: No Bone: No Small (1-33%) Large (67-100%) Small (1-33%) Epithelialization: Compression Therapy Compression Therapy Compression Therapy Procedures Performed: Treatment Notes Electronic Signature(s) Signed: 07/13/2021 4:44:32 PM By: Linton Ham MD Signed: 07/13/2021 5:47:38 PM By: Deon Pilling RN, BSN Entered By: Linton Ham on 07/13/2021 15:16:46 -------------------------------------------------------------------------------- Multi-Disciplinary Care Plan Details Patient Name: Date of Service: [name]. 07/13/2021 2:00 PM Medical Record Number: [id number] Patient Account Number: [id number] Date of Birth/Sex: Treating RN: Dec 13, 1950 (71 y.o. [name] Primary Care Mireyah Chervenak: [name] Other Clinician: Referring Tyshay Adee: Treating Inri Sobieski/Extender: Stormy Card in Treatment: [place] reviewed with physician Active Inactive Soft Tissue Infection Nursing Diagnoses: Impaired tissue  integrity Potential for infection: soft tissue Goals: Patient's soft tissue infection will  resolve Date Initiated: 04/20/2021 Target Resolution Date: 07/28/2021 Goal Status: Active Interventions: Assess signs and symptoms of infection every visit Provide education on infection Treatment Activities: Culture : 04/20/2021 Education provided on Infection : 07/10/2021 Systemic antibiotics : 04/20/2021 T ordered outside of clinic : 04/20/2021 est Notes: Wound/Skin Impairment Nursing Diagnoses: Impaired tissue integrity Knowledge deficit related to ulceration/compromised skin integrity Goals: Patient will have a decrease in wound volume by X% from date: (specify in notes) Date Initiated: 04/12/2021 Date Inactivated: 04/27/2021 Target Resolution Date: 04/23/2021 Goal Status: Met Patient/caregiver will verbalize understanding of skin care regimen Date Initiated: 04/12/2021 Target Resolution Date: 07/29/2021 Goal Status: Active Ulcer/skin breakdown will have a volume reduction of 30% by week 4 Date Initiated: 04/12/2021 Date Inactivated: 04/27/2021 Target Resolution Date: 04/27/2021 Goal Status: Unmet Unmet Reason: infection Ulcer/skin breakdown will have a volume reduction of 50% by week 8 Date Initiated: 04/27/2021 Date Inactivated: 06/29/2021 Target Resolution Date: 06/24/2021 Goal Status: Met Interventions: Assess patient/caregiver ability to obtain necessary supplies Assess patient/caregiver ability to perform ulcer/skin care regimen upon admission and as needed Assess ulceration(s) every visit Notes: Electronic Signature(s) Signed: 07/13/2021 5:47:51 PM By: Rhae Hammock RN Entered By: Rhae Hammock on 07/13/2021 14:49:42 -------------------------------------------------------------------------------- Pain Assessment Details Patient Name: Date of Service: [name]. 07/13/2021 2:00 PM Medical Record Number: [id number] Patient Account Number: [id number] Date of Birth/Sex: Treating RN: 15-Nov-1950 (71 y.o. [name], [name] Primary Care Jaquisha Frech: [name] Other Clinician: Referring Azlin Zilberman: Treating Shaterra Sanzone/Extender: Stormy Card in Treatment: 13 Active Problems Location of Pain Severity and Description of Pain Patient Has Paino Yes Patient Has Paino Yes Site Locations Pain Location: Generalized Pain, Pain in Ulcers With Dressing Change: Yes Duration of the Pain. Constant / Intermittento Constant Rate the pain. Current Pain Level: 4 Worst Pain Level: 10 Least Pain Level: 0 Tolerable Pain Level: 4 Character of Pain Describe the Pain: Aching Pain Management and Medication Current Pain Management: Medication: Yes Cold Application: No Rest: Yes Massage: No Activity: No T.E.N.S.: No Heat Application: No Leg drop or elevation: No Is the Current Pain Management Adequate: Adequate How does your wound impact your activities of daily livingo Sleep: No Bathing: No Appetite: No Relationship With Others: No Bladder Continence: No Emotions: No Bowel Continence: No Work: No Toileting: No Drive: No Dressing: No Hobbies: No Electronic Signature(s) Signed: 07/13/2021 5:47:51 PM By: Rhae Hammock RN Entered By: Rhae Hammock on 07/13/2021 14:11:03 -------------------------------------------------------------------------------- Patient/Caregiver Education Details Patient Name: Date of Service: [name] 1/19/2023andnbsp2:00 PM Medical Record Number: [id number] Patient Account Number: [id number] Date of Birth/Gender: Treating RN: 11-Jun-1951 (71 y.o. [name] Primary Care Physician: [name] Other Clinician: Referring Physician: Treating Physician/Extender: Stormy Card in Treatment: 13 Education Assessment Education Provided To: Patient Education Topics Provided Infection: Methods: Explain/Verbal Responses: State content correctly Electronic Signature(s) Signed: 07/13/2021 5:47:51 PM By: Rhae Hammock RN Entered By: Rhae Hammock  on 07/13/2021 14:50:57 -------------------------------------------------------------------------------- Wound Assessment Details Patient Name: Date of Service: [name]. 07/13/2021 2:00 PM Medical Record Number: [id number] Patient Account Number: [id number] Date of Birth/Sex: Treating RN: Jan 09, 1951 (71 y.o. [name] Primary Care Deandre Brannan: [name] Other Clinician: Referring Maniah Nading: Treating Goldman Birchall/Extender: Stormy Card in Treatment: 13 Wound Status Wound Number: 18 Primary Diabetic Wound/Ulcer of the Lower Extremity Etiology: Wound Location: Left, Plantar Metatarsal head first Wound Open Wounding Event: Gradually Appeared Status: Date Acquired: 08/23/2020 Comorbid  Glaucoma, Sleep Apnea, Hypertension, Peripheral Arterial Disease, Weeks Of Treatment: 13 History: Peripheral Venous Disease, Type II Diabetes, Gout, Osteoarthritis, Clustered Wound: No Neuropathy Photos Wound Measurements Length: (cm) 1.7 Width: (cm) 2.4 Depth: (cm) 0.1 Area: (cm) 3.204 Volume: (cm) 0.32 % Reduction in Area: 77.3% % Reduction in Volume: 88.7% Epithelialization: Small (1-33%) Tunneling: No Undermining: No Wound Description Classification: Grade 2 Wound Margin: Distinct, outline attached Exudate Amount: Medium Exudate Type: Serosanguineous Exudate Color: red, brown Foul Odor After Cleansing: No Slough/Fibrino No Wound Bed Granulation Amount: Large (67-100%) Exposed Structure Granulation Quality: Red, Pink Fascia Exposed: No Necrotic Amount: None Present (0%) Fat Layer (Subcutaneous Tissue) Exposed: No Tendon Exposed: No Muscle Exposed: No Joint Exposed: No Bone Exposed: No Treatment Notes Wound #18 (Metatarsal head first) Wound Laterality: Plantar, Left Cleanser Soap and Water Discharge Instruction: May shower and wash wound with dial antibacterial soap and water prior to dressing change. Wound Cleanser Discharge Instruction:  Cleanse the wound with wound cleanser prior to applying a clean dressing using gauze sponges, not tissue or cotton balls. Peri-Wound Care Triamcinolone 15 (g) Discharge Instruction: Use triamcinolone 15 (g) as directed Zinc Oxide Ointment 30g tube Discharge Instruction: Apply Zinc Oxide to periwound with each dressing change Sween Lotion (Moisturizing lotion) Discharge Instruction: Apply moisturizing lotion as directed Topical Primary Dressing Hydrofera Blue Classic Foam, 4x4 in Discharge Instruction: Moisten with saline prior to applying to wound bed Secondary Dressing ABD Pad, 5x9 Discharge Instruction: Apply over primary dressing as directed. Zetuvit Plus 4x8 in Discharge Instruction: Apply over primary dressing as directed. CarboFLEX Odor Control Dressing, 4x4 in Discharge Instruction: Apply over primary dressing as directed. Secured With Compression Wrap CoFlex TLC XL 2-layer Compression System 4x7 (in/yd) Discharge Instruction: Apply CoFlex 2-layer compression as directed. (alt for 4 layer) Compression Stockings Add-Ons Electronic Signature(s) Signed: 07/13/2021 2:37:26 PM By: Sandre Kitty Signed: 07/13/2021 5:47:51 PM By: Rhae Hammock RN Entered By: Sandre Kitty on 07/13/2021 14:34:00 -------------------------------------------------------------------------------- Wound Assessment Details Patient Name: Date of Service: [name]. 07/13/2021 2:00 PM Medical Record Number: [id number] Patient Account Number: [id number] Date of Birth/Sex: Treating RN: [birth year] (71 y.o. [name], [place] Primary Care Quavis Klutz: [name] Other Clinician: Referring Ardyth Kelso: Treating Bradly Sangiovanni/Extender: Stormy Card in Treatment: 13 Wound Status Wound Number: 19 Primary Diabetic Wound/Ulcer of the Lower Extremity Etiology: Wound Location: Left, Anterior Lower Leg Wound Open Wounding Event: Gradually Appeared Status: Date Acquired:  08/23/2020 Comorbid Glaucoma, Sleep Apnea, Hypertension, Peripheral Arterial Disease, Weeks Of Treatment: 13 History: Peripheral Venous Disease, Type II Diabetes, Gout, Osteoarthritis, Clustered Wound: No Neuropathy Photos Wound Measurements Length: (cm) 0.2 Width: (cm) 0.2 Depth: (cm) 0.2 Area: (cm) 0.031 Volume: (cm) 0.006 % Reduction in Area: 99.8% % Reduction in Volume: 99.9% Epithelialization: Large (67-100%) Tunneling: No Undermining: No Wound Description Classification: Grade 2 Wound Margin: Distinct, outline attached Exudate Amount: Medium Exudate Type: Serosanguineous Exudate Color: red, brown Foul Odor After Cleansing: No Slough/Fibrino No Wound Bed Granulation Amount: Large (67-100%) Exposed Structure Granulation Quality: Red, Pink Fascia Exposed: No Necrotic Amount: None Present (0%) Fat Layer (Subcutaneous Tissue) Exposed: No Tendon Exposed: No Muscle Exposed: No Joint Exposed: No Bone Exposed: No Treatment Notes Wound #19 (Lower Leg) Wound Laterality: Left, Anterior Cleanser Soap and Water Discharge Instruction: May shower and wash wound with dial antibacterial soap and water prior to dressing change. Wound Cleanser Discharge Instruction: Cleanse the wound with wound cleanser prior to applying a clean dressing using gauze sponges, not tissue or cotton balls. Peri-Wound Care Triamcinolone 15 (g)  Discharge Instruction: Use triamcinolone 15 (g) as directed Zinc Oxide Ointment 30g tube Discharge Instruction: Apply Zinc Oxide to periwound with each dressing change Sween Lotion (Moisturizing lotion) Discharge Instruction: Apply moisturizing lotion as directed Topical Primary Dressing Hydrofera Blue Classic Foam, 4x4 in Discharge Instruction: Moisten with saline prior to applying to wound bed Secondary Dressing ABD Pad, 5x9 Discharge Instruction: Apply over primary dressing as directed. Zetuvit Plus 4x8 in Discharge Instruction: Apply over primary  dressing as directed. CarboFLEX Odor Control Dressing, 4x4 in Discharge Instruction: Apply over primary dressing as directed. Secured With Compression Wrap CoFlex TLC XL 2-layer Compression System 4x7 (in/yd) Discharge Instruction: Apply CoFlex 2-layer compression as directed. (alt for 4 layer) Compression Stockings Add-Ons Electronic Signature(s) Signed: 07/13/2021 2:37:26 PM By: Sandre Kitty Signed: 07/13/2021 5:47:51 PM By: Rhae Hammock RN Entered By: Sandre Kitty on 07/13/2021 14:31:54 -------------------------------------------------------------------------------- Wound Assessment Details Patient Name: Date of Service: [name]. 07/13/2021 2:00 PM Medical Record Number: [id number] Patient Account Number: [id number] Date of Birth/Sex: Treating RN: 1950-08-28 (71 y.o. [name], [name] Primary Care Muhamed Luecke: [name] Other Clinician: Referring Macai Sisneros: Treating Yatziry Deakins/Extender: Stormy Card in Treatment: 13 Wound Status Wound Number: 22 Primary Cyst Etiology: Wound Location: Left, Lateral Lower Leg Wound Open Wounding Event: Bump Status: Date Acquired: 06/03/2021 Comorbid Glaucoma, Sleep Apnea, Hypertension, Peripheral Arterial Disease, Weeks Of Treatment: 5 History: Peripheral Venous Disease, Type II Diabetes, Gout, Osteoarthritis, Clustered Wound: No Neuropathy Photos Wound Measurements Length: (cm) 3.4 Width: (cm) 2.4 Depth: (cm) 1 Area: (cm) 6.409 Volume: (cm) 6.409 % Reduction in Area: -288.7% % Reduction in Volume: -385.9% Epithelialization: Small (1-33%) Tunneling: Yes Position (o'clock): 12 Maximum Distance: (cm) 15 Undermining: Yes Starting Position (o'clock): 10 Ending Position (o'clock): 2 Maximum Distance: (cm) 2 Wound Description Classification: Full Thickness With Exposed Support Structures Wound Margin: Distinct, outline attached Exudate Amount: Large Exudate Type: Purulent Exudate Color:  yellow, brown, green Foul Odor After Cleansing: No Slough/Fibrino Yes Wound Bed Granulation Amount: Medium (34-66%) Exposed Structure Granulation Quality: Red, Pink Fascia Exposed: No Necrotic Amount: Medium (34-66%) Fat Layer (Subcutaneous Tissue) Exposed: Yes Necrotic Quality: Adherent Slough Tendon Exposed: No Muscle Exposed: No Joint Exposed: No Bone Exposed: No Treatment Notes Wound #22 (Lower Leg) Wound Laterality: Left, Lateral Cleanser Soap and Water Discharge Instruction: May shower and wash wound with dial antibacterial soap and water prior to dressing change. Wound Cleanser Discharge Instruction: Cleanse the wound with wound cleanser prior to applying a clean dressing using gauze sponges, not tissue or cotton balls. Peri-Wound Care Triamcinolone 15 (g) Discharge Instruction: Use triamcinolone 15 (g) as directed Zinc Oxide Ointment 30g tube Discharge Instruction: Apply Zinc Oxide to periwound with each dressing change Sween Lotion (Moisturizing lotion) Discharge Instruction: Apply moisturizing lotion as directed Topical Primary Dressing KerraCel Ag Gelling Fiber Dressing, 4x5 in (silver alginate) Discharge Instruction: Apply silver alginate to wound bed as instructed Secondary Dressing ABD Pad, 5x9 Discharge Instruction: Apply over primary dressing as directed. Zetuvit Plus 4x8 in Discharge Instruction: Apply over primary dressing as directed. CarboFLEX Odor Control Dressing, 4x4 in Discharge Instruction: Apply over primary dressing as directed. Secured With Compression Wrap CoFlex TLC XL 2-layer Compression System 4x7 (in/yd) Discharge Instruction: Apply CoFlex 2-layer compression as directed. (alt for 4 layer) Compression Stockings Add-Ons Electronic Signature(s) Signed: 07/13/2021 2:37:26 PM By: Sandre Kitty Signed: 07/13/2021 5:47:51 PM By: Rhae Hammock RN Entered By: Sandre Kitty on 07/13/2021  14:34:45 -------------------------------------------------------------------------------- Vitals Details Patient Name: Date of Service: [name]. 07/13/2021 2:00 PM Medical  Record Number: [id number] Patient Account Number: [id number] Date of Birth/Sex: Treating RN: Feb 23, 1951 (71 y.o. [name], [name] Primary Care Orpha Dain: [name] Other Clinician: Referring Moniqua Engebretsen: Treating Yardley Lekas/Extender: Stormy Card in Treatment: 13 Vital Signs Time Taken: 14:07 Temperature (F): 98.7 Height (in): 74 Pulse (bpm): 64 Weight (lbs): 238 Respiratory Rate (breaths/min): 17 Body Mass Index (BMI): 30.6 Blood Pressure (mmHg): 134/74 Capillary Blood Glucose (mg/dl): 167 Reference Range: 80 - 120 mg / dl Electronic Signature(s) Signed: 07/13/2021 5:47:51 PM By: Rhae Hammock RN Entered By: Rhae Hammock on 07/13/2021 14:10:00

## 2021-07-13 NOTE — Progress Notes (Signed)
[name], [name] ([id number]) Visit Report for 06/13/2021 Arrival Information Details Patient Name: Date of Service: [name], [name] 06/13/2021 1:45 PM Medical Record Number: [id number] Patient Account Number: [id number] Date of Birth/Sex: Treating RN: 12-05-1950 (71 y.o. [name] Primary Care Provider: [name] Other Clinician: Referring Provider: Treating Provider/Extender: Stormy Card in Treatment: 8 Visit Information History Since Last Visit Added or deleted any medications: Yes Patient Arrived: Ambulatory Any new allergies or adverse reactions: No Arrival Time: 13:50 Had a fall or experienced change in No Accompanied By: spouse activities of daily living that may affect Transfer Assistance: None risk of falls: Patient Identification Verified: Yes Signs or symptoms of abuse/neglect since last visito No Secondary Verification Process Completed: Yes Hospitalized since last visit: Yes Patient Requires Transmission-Based Precautions: No Has Dressing in Place as Prescribed: Yes Patient Has Alerts: Yes Has Compression in Place as Prescribed: Yes Patient Alerts: ABI's: 09/22 L:1.09 R: 1. Pain Present Now: Yes TBI's: R:0.72 L:0.99 Electronic Signature(s) Signed: 06/13/2021 5:35:33 PM By: Levan Hurst RN, BSN Entered By: Levan Hurst on 06/13/2021 14:07:24 -------------------------------------------------------------------------------- Compression Therapy Details Patient Name: Date of Service: [name]. 06/13/2021 1:45 PM Medical Record Number: [id number] Patient Account Number: [id number] Date of Birth/Sex: Treating RN: 04-May-1951 (71 y.o. [name] Primary Care Provider: [name] Other Clinician: Referring Provider: Treating Provider/Extender: Stormy Card in Treatment: 8 Compression Therapy Performed for Wound Assessment: Wound #18 Left,Plantar Metatarsal head first Performed By: Clinician  Levan Hurst, RN Compression Type: Rolena Infante Post Procedure Diagnosis Same as Pre-procedure Electronic Signature(s) Signed: 06/13/2021 5:35:33 PM By: Levan Hurst RN, BSN Entered By: Levan Hurst on 06/13/2021 14:29:27 -------------------------------------------------------------------------------- Compression Therapy Details Patient Name: Date of Service: [name]. 06/13/2021 1:45 PM Medical Record Number: [id number] Patient Account Number: [id number] Date of Birth/Sex: Treating RN: 04/04/1951 (71 y.o. [name] Primary Care Provider: [name] Other Clinician: Referring Provider: Treating Provider/Extender: Stormy Card in Treatment: 8 Compression Therapy Performed for Wound Assessment: Wound #19 Left,Anterior Lower Leg Performed By: Clinician Levan Hurst, RN Compression Type: Rolena Infante Post Procedure Diagnosis Same as Pre-procedure Electronic Signature(s) Signed: 06/13/2021 5:35:33 PM By: Levan Hurst RN, BSN Entered By: Levan Hurst on 06/13/2021 14:29:27 -------------------------------------------------------------------------------- Compression Therapy Details Patient Name: Date of Service: [name]. 06/13/2021 1:45 PM Medical Record Number: [id number] Patient Account Number: [id number] Date of Birth/Sex: Treating RN: 06/01/1951 (71 y.o. [name] Primary Care Provider: [name] Other Clinician: Referring Provider: Treating Provider/Extender: Stormy Card in Treatment: 8 Compression Therapy Performed for Wound Assessment: Wound #22 Left,Lateral Lower Leg Performed By: Clinician Levan Hurst, RN Compression Type: Rolena Infante Post Procedure Diagnosis Same as Pre-procedure Electronic Signature(s) Signed: 06/13/2021 5:35:33 PM By: Levan Hurst RN, BSN Entered By: Levan Hurst on 06/13/2021  14:29:27 -------------------------------------------------------------------------------- Encounter Discharge Information Details Patient Name: Date of Service: [name]. 06/13/2021 1:45 PM Medical Record Number: [id number] Patient Account Number: [id number] Date of Birth/Sex: Treating RN: 10-05-1950 (71 y.o. [name] Primary Care Provider: [name] Other Clinician: Referring Provider: Treating Provider/Extender: Stormy Card in Treatment: 8 Encounter Discharge Information Items Discharge Condition: Stable Ambulatory Status: Ambulatory Discharge Destination: Home Transportation: Private Auto Accompanied By: spouse Schedule Follow-up Appointment: Yes Clinical Summary of Care: Patient Declined Electronic Signature(s) Signed: 06/13/2021 5:35:33 PM By: Levan Hurst RN, BSN Signed: 06/13/2021 5:35:33 PM By: Levan Hurst RN, BSN Entered By: Levan Hurst on 06/13/2021 17:14:14 --------------------------------------------------------------------------------  Lower Extremity Assessment Details Patient Name: Date of Service: [name], [name] 06/13/2021 1:45 PM Medical Record Number: [id number] Patient Account Number: [id number] Date of Birth/Sex: Treating RN: 1950/11/17 (71 y.o. [name] Primary Care Provider: [name] Other Clinician: Referring Provider: Treating Provider/Extender: Stormy Card in Treatment: 8 Edema Assessment Assessed: [name]: No] [name]: No] Edema: [Left: Ye] [Right: s] Calf Left: Right: Point of Measurement: 41 cm From Medial Instep 43 cm Ankle Left: Right: Point of Measurement: 10 cm From Medial Instep 26.7 cm Vascular Assessment Pulses: Dorsalis Pedis Palpable: [Left:Yes] Notes Calf 62.5 cm Electronic Signature(s) Signed: 06/13/2021 5:35:33 PM By: Levan Hurst RN, BSN Entered By: Levan Hurst on 06/13/2021  14:09:07 -------------------------------------------------------------------------------- Multi Wound Chart Details Patient Name: Date of Service: [name]. 06/13/2021 1:45 PM Medical Record Number: [id number] Patient Account Number: [id number] Date of Birth/Sex: Treating RN: [birth year] (71 y.o. [name] Primary Care Provider: [name] Other Clinician: Referring Provider: Treating Provider/Extender: Stormy Card in Treatment: 8 Vital Signs Height(in): 5 Capillary Blood Glucose(mg/dl): 245 Weight(lbs): 238 Pulse(bpm): 39 Body Mass Index(BMI): 31 Blood Pressure(mmHg): 148/81 Temperature(F): 98.1 Respiratory Rate(breaths/min): 18 Photos: [18:Left, Plantar Metatarsal head first] [19:Left, Anterior Lower Leg] [21:Left, Medial Lower Leg] Wound Location: [18:Gradually Appeared] [19:Gradually Appeared] [21:Gradually Appeared] Wounding Event: [18:Diabetic Wound/Ulcer of the Lower] [19:Diabetic Wound/Ulcer of the Lower] [21:Diabetic Wound/Ulcer of the Lower] Primary Etiology: [18:Extremity Glaucoma, Sleep Apnea,] [19:Extremity Glaucoma, Sleep Apnea,] [21:Extremity Glaucoma, Sleep Apnea,] Comorbid History: [18:Hypertension, Peripheral Arterial Disease, Peripheral Venous Disease, Disease, Peripheral Venous Disease, Disease, Peripheral Venous Disease, Type II Diabetes, Gout, Osteoarthritis, Type II Diabetes, Gout, Osteoarthritis, Type II  Diabetes, Gout, Osteoarthritis, Neuropathy 08/23/2020] [19:Hypertension, Peripheral Arterial Neuropathy 08/23/2020] [21:Hypertension, Peripheral Arterial Neuropathy 08/23/2020] Date Acquired: [18:8] [19:8] [21:8] Weeks of Treatment: [18:Open] [19:Open] [21:Healed - Epithelialized] Wound Status: [18:2.6x2.9x0.1] [19:2.7x1.8x0.1] [21:0x0x0] Measurements L x W x D (cm) [18:5.922] [19:3.817] [21:0] A (cm) : rea [18:0.592] [19:0.382] [21:0] Volume (cm) : [18:58.10%] [19:73.60%] [21:100.00%] % Reduction in A rea: [18:79.10%]  [19:91.20%] [21:100.00%] % Reduction in Volume: [18:Grade 2] [19:Grade 2] [21:Grade 2] Classification: [18:Medium] [19:Medium] [21:None Present] Exudate A mount: [18:Serosanguineous] [19:Serosanguineous] [21:N/A] Exudate Type: [18:red, brown] [19:red, brown] [21:N/A] Exudate Color: [18:Distinct, outline attached] [19:Flat and Intact] [21:N/A] Wound Margin: [18:Large (67-100%)] [19:Large (67-100%)] [21:None Present (0%)] Granulation A mount: [18:Red, Pink, Hyper-granulation] [19:Red] [21:N/A] Granulation Quality: [18:Small (1-33%)] [19:Small (1-33%)] [21:None Present (0%)] Necrotic A mount: [18:Fat Layer (Subcutaneous Tissue): Yes Fat Layer (Subcutaneous Tissue): Yes Fascia: No] Exposed Structures: [18:Fascia: No Tendon: No Muscle: No Joint: No Bone: No Small (1-33%)] [19:Fascia: No Tendon: No Muscle: No Joint: No Bone: No Small (1-33%)] [21:Fat Layer (Subcutaneous Tissue): No Tendon: No Muscle: No Joint: No Bone: No Large (67-100%)] Epithelialization: [18:N/A] [19:N/A] [21:N/A] Assessment Notes: [18:Compression Therapy] [19:Compression Therapy] [21:N/A] Wound Number: 22 N/A N/A Photos: N/A N/A Left, Lateral Lower Leg N/A N/A Wound Location: Bump N/A N/A Wounding Event: Cyst N/A N/A Primary Etiology: Glaucoma, Sleep Apnea, N/A N/A Comorbid History: Hypertension, Peripheral Arterial Disease, Peripheral Venous Disease, Type II Diabetes, Gout, Osteoarthritis, Neuropathy 06/03/2021 N/A N/A Date Acquired: 1 N/A N/A Weeks of Treatment: Open N/A N/A Wound Status: 4x2.5x1.8 N/A N/A Measurements L x W x D (cm) 7.854 N/A N/A A (cm) : rea 14.137 N/A N/A Volume (cm) : -376.30% N/A N/A % Reduction in Area: -971.80% N/A N/A % Reduction in Volume: Full Thickness Without Exposed N/A N/A Classification: Support Structures Large N/A N/A Exudate Amount: Sanguinous N/A N/A Exudate Type: red N/A N/A  Exudate Color: Well defined, not attached N/A N/A Wound Margin: Large (67-100%)  N/A N/A Granulation Amount: Red N/A N/A Granulation Quality: Small (1-33%) N/A N/A Necrotic Amount: Fat Layer (Subcutaneous Tissue): Yes N/A N/A Exposed Structures: Fascia: No Tendon: No Muscle: No Joint: No Bone: No Small (1-33%) N/A N/A Epithelialization: Penrose drain in place, secured in N/A N/A Assessment Notes: place with staples and sutures Compression Therapy N/A N/A Procedures Performed: Treatment Notes Electronic Signature(s) Signed: 06/13/2021 4:29:35 PM By: Linton Ham MD Signed: 06/13/2021 5:35:33 PM By: Levan Hurst RN, BSN Entered By: Linton Ham on 06/13/2021 14:41:46 -------------------------------------------------------------------------------- Multi-Disciplinary Care Plan Details Patient Name: Date of Service: [name]. 06/13/2021 1:45 PM Medical Record Number: [id number] Patient Account Number: [id number] Date of Birth/Sex: Treating RN: 05-Jun-1951 (71 y.o. [name] Primary Care Provider: [name] Other Clinician: Referring Provider: Treating Provider/Extender: Stormy Card in Treatment: 8 Multidisciplinary Care Plan reviewed with physician Active Inactive Soft Tissue Infection Nursing Diagnoses: Impaired tissue integrity Potential for infection: soft tissue Goals: Patient's soft tissue infection will resolve Date Initiated: 04/20/2021 Target Resolution Date: 06/24/2021 Goal Status: Active Interventions: Assess signs and symptoms of infection every visit Provide education on infection Treatment Activities: Culture : 04/20/2021 Education provided on Infection : 05/30/2021 Systemic antibiotics : 04/20/2021 T ordered outside of clinic : 04/20/2021 est Notes: Wound/Skin Impairment Nursing Diagnoses: Impaired tissue integrity Knowledge deficit related to ulceration/compromised skin integrity Goals: Patient will have a decrease in wound volume by X% from date: (specify in notes) Date  Initiated: 04/12/2021 Date Inactivated: 04/27/2021 Target Resolution Date: 04/23/2021 Goal Status: Met Patient/caregiver will verbalize understanding of skin care regimen Date Initiated: 04/12/2021 Target Resolution Date: 06/24/2021 Goal Status: Active Ulcer/skin breakdown will have a volume reduction of 30% by week 4 Date Initiated: 04/12/2021 Date Inactivated: 04/27/2021 Target Resolution Date: 04/27/2021 Goal Status: Unmet Unmet Reason: infection Ulcer/skin breakdown will have a volume reduction of 50% by week 8 Date Initiated: 04/27/2021 Target Resolution Date: 06/24/2021 Goal Status: Active Interventions: Assess patient/caregiver ability to obtain necessary supplies Assess patient/caregiver ability to perform ulcer/skin care regimen upon admission and as needed Assess ulceration(s) every visit Notes: Electronic Signature(s) Signed: 06/13/2021 5:35:33 PM By: Levan Hurst RN, BSN Entered By: Levan Hurst on 06/13/2021 17:05:01 -------------------------------------------------------------------------------- Pain Assessment Details Patient Name: Date of Service: [name]. 06/13/2021 1:45 PM Medical Record Number: [id number] Patient Account Number: [id number] Date of Birth/Sex: Treating RN: [birth year] (71 y.o. [name] Primary Care Provider: [name] Other Clinician: Referring Provider: Treating Provider/Extender: Stormy Card in Treatment: 8 Active Problems Location of Pain Severity and Description of Pain Patient Has Paino Yes Site Locations Rate the pain. Current Pain Level: 4 Pain Management and Medication Current Pain Management: Medication: No Cold Application: No Rest: No Massage: No Activity: No T.E.N.S.: No Heat Application: No Leg drop or elevation: No Is the Current Pain Management Adequate: Adequate How does your wound impact your activities of daily livingo Sleep: No Bathing: No Appetite:  No Relationship With Others: No Bladder Continence: No Emotions: No Bowel Continence: No Work: No Toileting: No Drive: No Dressing: No Hobbies: No Engineer, maintenance) Signed: 06/13/2021 5:35:33 PM By: Levan Hurst RN, BSN Entered By: Levan Hurst on 06/13/2021 14:08:01 -------------------------------------------------------------------------------- Patient/Caregiver Education Details Patient Name: Date of Service: [name] 12/20/2022andnbsp1:45 PM Medical Record Number: [id number] Patient Account Number: [id number] Date of Birth/Gender: Treating RN: September 14, 1950 (71 y.o. [name] Primary Care Physician: [name] Other Clinician: Referring Physician: Treating  Physician/Extender: Stormy Card in Treatment: 8 Education Assessment Education Provided To: Patient Education Topics Provided Wound/Skin Impairment: Methods: Explain/Verbal Responses: State content correctly [employer]) Signed: 06/13/2021 5:35:33 PM By: Levan Hurst RN, BSN Entered By: Levan Hurst on 06/13/2021 17:05:19 -------------------------------------------------------------------------------- Wound Assessment Details Patient Name: Date of Service: [name]. 06/13/2021 1:45 PM Medical Record Number: [id number] Patient Account Number: [id number] Date of Birth/Sex: Treating RN: 15-Dec-1950 (71 y.o. [name], [name] Primary Care Niyah Mamaril: [name] Other Clinician: Referring [name]: Treating [name]/Extender: Stormy Card in Treatment: 8 Wound Status Wound Number: 18 Primary Diabetic Wound/Ulcer of the Lower Extremity Etiology: Wound Location: Left, Plantar Metatarsal head first Wound Open Wounding Event: Gradually Appeared Status: Date Acquired: 08/23/2020 Comorbid Glaucoma, Sleep Apnea, Hypertension, Peripheral Arterial Disease, Weeks Of Treatment: 8 History: Peripheral Venous Disease, Type II  Diabetes, Gout, Osteoarthritis, Clustered Wound: No Neuropathy Photos Wound Measurements Length: (cm) 2.6 Width: (cm) 2.9 Depth: (cm) 0.1 Area: (cm) 5.922 Volume: (cm) 0.592 % Reduction in Area: 58.1% % Reduction in Volume: 79.1% Epithelialization: Small (1-33%) Tunneling: No Undermining: No Wound Description Classification: Grade 2 Wound Margin: Distinct, outline attached Exudate Amount: Medium Exudate Type: Serosanguineous Exudate Color: red, brown Foul Odor After Cleansing: No Slough/Fibrino No Wound Bed Granulation Amount: Large (67-100%) Exposed Structure Granulation Quality: Red, Pink, Hyper-granulation Fascia Exposed: No Necrotic Amount: Small (1-33%) Fat Layer (Subcutaneous Tissue) Exposed: Yes Necrotic Quality: Adherent Slough Tendon Exposed: No Muscle Exposed: No Joint Exposed: No Bone Exposed: No Electronic Signature(s) Signed: 07/13/2021 12:37:40 PM By: Rhae Hammock RN Entered By: Rhae Hammock on 06/13/2021 14:00:20 -------------------------------------------------------------------------------- Wound Assessment Details Patient Name: Date of Service: [name]. 06/13/2021 1:45 PM Medical Record Number: [id number] Patient Account Number: [id number] Date of Birth/Sex: Treating RN: 08/01/1950 (71 y.o. [name], [name] Primary Care Raahil Ong: [name] Other Clinician: Referring [name]: Treating [name]/Extender: Stormy Card in Treatment: 8 Wound Status Wound Number: 19 Primary Diabetic Wound/Ulcer of the Lower Extremity Etiology: Wound Location: Left, Anterior Lower Leg Wound Open Wounding Event: Gradually Appeared Status: Date Acquired: 08/23/2020 Comorbid Glaucoma, Sleep Apnea, Hypertension, Peripheral Arterial Disease, Weeks Of Treatment: 8 History: Peripheral Venous Disease, Type II Diabetes, Gout, Osteoarthritis, Clustered Wound: No Neuropathy Photos Wound Measurements Length: (cm) 2.7 Width:  (cm) 1.8 Depth: (cm) 0.1 Area: (cm) 3.817 Volume: (cm) 0.382 % Reduction in Area: 73.6% % Reduction in Volume: 91.2% Epithelialization: Small (1-33%) Tunneling: No Undermining: No Wound Description Classification: Grade 2 Wound Margin: Flat and Intact Exudate Amount: Medium Exudate Type: Serosanguineous Exudate Color: red, brown Foul Odor After Cleansing: No Slough/Fibrino No Wound Bed Granulation Amount: Large (67-100%) Exposed Structure Granulation Quality: Red Fascia Exposed: No Necrotic Amount: Small (1-33%) Fat Layer (Subcutaneous Tissue) Exposed: Yes Necrotic Quality: Adherent Slough Tendon Exposed: No Muscle Exposed: No Joint Exposed: No Bone Exposed: No Electronic Signature(s) Signed: 07/13/2021 12:37:40 PM By: Rhae Hammock RN Entered By: Rhae Hammock on 06/13/2021 14:01:58 -------------------------------------------------------------------------------- Wound Assessment Details Patient Name: Date of Service: [name]. 06/13/2021 1:45 PM Medical Record Number: [id number] Patient Account Number: [id number] Date of Birth/Sex: Treating RN: 1951/02/09 (71 y.o. [name], [name] Primary Care Sakeena Teall: [name] Other Clinician: Referring [name]: Treating Kaniel Kiang/Extender: Stormy Card in Treatment: 8 Wound Status Wound Number: 21 Primary Diabetic Wound/Ulcer of the Lower Extremity Etiology: Wound Location: Left, Medial Lower Leg Wound Healed - Epithelialized Wounding Event: Gradually Appeared Status: Date Acquired: 08/23/2020 Comorbid Glaucoma, Sleep Apnea, Hypertension, Peripheral Arterial Disease, Weeks Of Treatment: 8  History: Peripheral Venous Disease, Type II Diabetes, Gout, Osteoarthritis, Clustered Wound: No Neuropathy Photos Wound Measurements Length: (cm) Width: (cm) Depth: (cm) Area: (cm) Volume: (cm) 0 % Reduction in Area: 100% 0 % Reduction in Volume: 100% 0 Epithelialization: Large  (67-100%) 0 Tunneling: No 0 Undermining: No Wound Description Classification: Grade 2 Exudate Amount: None Present Wound Bed Granulation Amount: None Present (0%) Exposed Structure Necrotic Amount: None Present (0%) Fascia Exposed: No Fat Layer (Subcutaneous Tissue) Exposed: No Tendon Exposed: No Muscle Exposed: No Joint Exposed: No Bone Exposed: No Electronic Signature(s) Signed: 07/13/2021 12:37:40 PM By: Rhae Hammock RN Entered By: Rhae Hammock on 06/13/2021 14:03:19 -------------------------------------------------------------------------------- Wound Assessment Details Patient Name: Date of Service: [name]. 06/13/2021 1:45 PM Medical Record Number: [id number] Patient Account Number: [id number] Date of Birth/Sex: Treating RN: May 09, 1951 (71 y.o. [name], [name] Primary Care Provider: [name] Other Clinician: Referring Provider: Treating Provider/Extender: Stormy Card in Treatment: 8 Wound Status Wound Number: 22 Primary Cyst Etiology: Wound Location: Left, Lateral Lower Leg Wound Open Wounding Event: Bump Status: Date Acquired: 06/03/2021 Comorbid Glaucoma, Sleep Apnea, Hypertension, Peripheral Arterial Disease, Weeks Of Treatment: 1 History: Peripheral Venous Disease, Type II Diabetes, Gout, Osteoarthritis, Clustered Wound: No Neuropathy Photos Wound Measurements Length: (cm) 4 Width: (cm) 2.5 Depth: (cm) 1.8 Area: (cm) 7.854 Volume: (cm) 14.137 % Reduction in Area: -376.3% % Reduction in Volume: -971.8% Epithelialization: Small (1-33%) Tunneling: No Undermining: No Wound Description Classification: Full Thickness Without Exposed Support Structures Wound Margin: Well defined, not attached Exudate Amount: Large Exudate Type: Sanguinous Exudate Color: red Foul Odor After Cleansing: No Slough/Fibrino No Wound Bed Granulation Amount: Large (67-100%) Exposed Structure Granulation Quality: Red Fascia  Exposed: No Necrotic Amount: Small (1-33%) Fat Layer (Subcutaneous Tissue) Exposed: Yes Necrotic Quality: Adherent Slough Tendon Exposed: No Muscle Exposed: No Joint Exposed: No Bone Exposed: No Assessment Notes Penrose drain in place, secured in place with staples and sutures Electronic Signature(s) Signed: 06/13/2021 5:35:33 PM By: Levan Hurst RN, BSN Signed: 07/13/2021 12:37:40 PM By: Rhae Hammock RN Entered By: Levan Hurst on 06/13/2021 14:27:11 -------------------------------------------------------------------------------- Vitals Details Patient Name: Date of Service: [name]. 06/13/2021 1:45 PM Medical Record Number: [id number] Patient Account Number: [id number] Date of Birth/Sex: Treating RN: Aug 04, 1950 (71 y.o. [name] Primary Care Provider: [name] Other Clinician: Referring Provider: Treating Provider/Extender: Stormy Card in Treatment: 8 Vital Signs Time Taken: 13:50 Temperature (F): 98.1 Height (in): 74 Pulse (bpm): 80 Weight (lbs): 238 Respiratory Rate (breaths/min): 18 Body Mass Index (BMI): 30.6 Blood Pressure (mmHg): 148/81 Capillary Blood Glucose (mg/dl): 245 Reference Range: 80 - 120 mg / dl Notes glucose per pt report Electronic Signature(s) Signed: 06/13/2021 5:35:33 PM By: Levan Hurst RN, BSN Entered By: Levan Hurst on 06/13/2021 14:07:48

## 2021-07-13 NOTE — Progress Notes (Signed)
MARCK, MCCLENNY ([id number]) Visit Report for 07/13/2021 HPI Details Patient Name: Date of Service: [name], [name]. 07/13/2021 2:00 PM Medical Record Number: [id number] Patient Account Number: [id number] Date of Birth/Sex: Treating RN: 19-May-1951 (71 y.o. [name] Primary Care Provider: [name] Other Clinician: Referring Provider: Treating Provider/Extender: Stormy Card in Treatment: 13 History of Present Illness HPI Description: 10/11/17; [name] is a 71 year old man who tells me that in 2015 he slipped down the latter traumatizing his left leg. He developed a wound in the same spot the area that we are currently looking at. He states this closed over for the most part although he always felt it was somewhat unstable. In 2016 he hit the same area with the door of his car had this reopened. He tells me that this is never really closed although sometimes an inflow it remains open on a constant basis. He has not been using any specific dressing to this except for topical antibiotics the nature of which were not really sure. His primary doctor did send him to see Dr. Einar [name] of interventional cardiology. He underwent an angiogram on 08/06/17 and he underwent a PTA and directional atherectomy of the lesser distal SFA and popliteal arteries which resulted in brisk improvement in blood flow. It was noted that he had 2 vessel runoff through the anterior tibial and peroneal. He is also been to see vascular and interventional radiologist. He was not felt to have any significant superficial venous insufficiency. Presumably is not a candidate for any ablation. It was suggested he come here for wound care. The patient is a type II diabetic on insulin. He also has a history of venous insufficiency. ABIs on the left were noncompressible in our clinic 10/21/17; patient we admitted to the clinic last week. 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 We put Iodosorb on him after an aggressive debridement and 3 layer compression. He complained of pain in his ankle and itching with is skin in fact he scratched the area on the medial calf superiorly at the rim of our wraps and he has 2 small open areas in that location today which are new. I changed his primary dressing today to silver collagen. As noted he is already had revascularization and does not have any significant superficial venous insufficiency that would be amenable to ablation 10/28/17; patient admitted to the clinic 2 weeks ago. He has a smaller Wound. Scratch injury from last week revealed. There is large wound over the tibial area. This is smaller. Granulation looks healthy. No need for debridement. 11/04/17; the wound on the left lateral calf looks better. Improved dimensions. Surface of this looks better. We've been maintaining him and Kerlix Coban wraps. He finds this much more comfortable. Silver collagen dressing 11/11/17; left lateral Wound continues to look healthy be making progress. Using a #5 curet I removed removed nonviable skin from the surface of the wound and then necrotic debris from the wound surface. Surface of the wound continues to look healthy. He also has an open area on the left great toenail bed. We've been using topical antibiotics. 11/19/17; left anterior lateral wound continues to look healthy but it's not closed. He also had a small wound above this on the left leg Initially traumatic wounds in the setting of significant chronic venous insufficiency and stasis dermatitis 11/25/17; left anterior wounds superiorly is closed still a small wound inferiorly. 12/02/17;  left anterior tibial area. Arrives today with adherent callus. Post debridement clearly not completely closed. Hydrofera Blue under 3 layer compression. 12/09/17; left anterior tibia. Circumferential eschar however the wound bed looks stable to improved. We've been  using Hydrofera Blue under 3 layer compression 12/17/17; left anterior tibia. Apparently this was felt to be closed however when the wrap was taken off there is a skin tear to reopen wounds in the same area we've been using Hydrofera Blue under 3 layer compression 12/23/17 left anterior tibia. Not close to close this week apparently the [facility] was stuck to this again. Still circumferential eschar requiring debridement. I put a contact layer on this this time under the Hydrofera Blue 12/31/17; left anterior tibia. Wound is better slight amount of hyper-granulation. Using Hydrofera Blue over Adaptic. 01/07/18; left anterior tibia. The wound had some surface eschar however after this was removed he has no open wound.he was already revascularized by Dr. Einar [name] when he came to our clinic with atherectomy of the left SFA and popliteal artery. He was also sent to interventional radiology for venous reflux studies. He was not felt to have significant reflux but certainly has chronic venous changes of his skin with hemosiderin deposition around this area. He will definitely need to lubricate his skin and wear compression stocking and I've talked to him about this. READMISSION 05/26/2018 This is a now 71 year old man we cared for with traumatic wounds on his left anterior lower extremity. He had been previously revascularized during that admission by Dr. Einar [name]. Apparently in follow-up Dr. Einar [name] noted that he had deterioration in his arterial status. He underwent a stent placement in the distal left SFA on 04/22/2018. 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 He went back to the angiography suite on 04/30/2018 he underwent PTA and balloon angioplasty of the occluded left mid anterior tibial artery, thrombotic occlusion went from 100 to 0% which reconstitutes the posterior tibial artery. He had thrombectomy and aspiration of the peroneal artery. The stent placed in the distal SFA left SFA was  still occluded. 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 He has been recuperating at [place] home in [facility] after the most recent hospitalization. He is going home tomorrow. He tells me that 2 weeks ago he traumatized the tip of his left fifth toe. He came in urgently for our review of this. This was a history of before I noted that Dr. Einar [name] had already noted dry gangrenous changes of the left fifth toe 06/09/2018; 2-week follow-up. I did contact Dr. Einar [name] after his last appointment and he apparently saw 1 of Dr. Irven Shelling colleagues the next day. He does not follow-up with Dr. Einar [name] himself until Thursday of this week. 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 Nevertheless there is no evidence of infection no drainage and no pain. He had a new area that this week when we were signing him in today on the left anterior mid tibia area, this is in close proximity to the previous wound we have dealt with in this clinic. 06/23/2018; 2-week follow-up. I did not receive a recent note from Dr. Einar [name] to review today. Our office is trying to obtain this. He is apparently  not planning to do further vascular interventions and wondered about compression to try and help with the patient's chronic venous insufficiency. However we are also concerned about the arterial flow. He arrives in clinic today with a new area on the left third toe. The areas on the calf/anterior tibia are close to closing. The left fifth toe is still mummified using Betadine. -In reviewing things with the patient he has what sounds like claudication with mild to moderate amount of activity. 06/27/2018; x-ray of his foot suggested osteomyelitis of the left third  toe. I prescribed Levaquin over the phone while we attempted to arrange a plan of care. However the patient called yesterday to report he had low-grade fever and he came in today acutely. There is been a marked deterioration in the left third toe with spreading cellulitis up into the dorsal left foot. He was referred to the emergency room. Readmission: 06/29/2020 patient presents today for reevaluation here in our clinic he was previously treated by Dr. Dellia Nims at the latter part of 2019 in 2 the beginning of 2020. Subsequently we have not seen him since that time in the interim he did have evaluation with vein and vascular specialist specifically Dr. Anice Paganini who did perform quite extensive work for a left femoral to anterior tibial artery bypass. With that being said in the interim the patient has developed significant lymphedema and has wounds that he tells me have really never healed in regard to the incision site on the left leg. He also has multiple wounds on the feet for various reasons some of which is that he tends to pick at his feet. Fortunately there is no signs of active infection systemically at this time he does have some wounds that are little bit deeper but most are fairly superficial he seems to have good blood flow and overall everything appears to be healthy I see no bone exposed and no obvious signs of osteomyelitis. I do not know that he necessarily needs a x-ray at this point although that something we could consider depending on how things progress. The patient does have a history of lymphedema, diabetes, this is type II, chronic kidney disease stage III, hypertension, and history of peripheral vascular disease. 07/05/2020; patient admitted last week. Is a patient I remember from 2019 he had a spreading infection involving the left foot and we sent him to the hospital. 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 He subsequently had a left  femoral to anterior tibial bypass by Dr.Cain vein and vascular. He also has severe lymphedema with chronic skin changes related to that on the left leg. The most problematic area that was new today was on the left medial great toe. This was apparently a small area last week there was purulent drainage which our intake nurse cultured. Also areas on the left medial foot and heel left lateral foot. He has 2 areas on the left medial calf left lateral calf in the setting of the severe lymphedema. 07/13/2020 on evaluation today patient appears to be doing better in my opinion compared to his last visit. The good news is there is no signs of active infection systemically and locally I do not see any signs of infection either. He did have an x-ray which was negative that is great news he had a culture which showed MRSA but at the same time he is been on the doxycycline which has helped. I do think we may  want to extend this for 7 additional days 1/25; patient admitted to the clinic a few weeks ago. 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 We have been putting him under compression his edema control is a lot better but he is severe verricused skin on the left leg. He is really done quite well he still has an open area on the left medial calf and the left medial first metatarsal head. We have been using silver collagen on the leg silver alginate on the foot 07/27/2020 upon evaluation today patient appears to be doing decently well in regard to his wounds. He still has a lot of dry skin on the left leg. Some of this is starting to peel back and I think he may be able to have them out by removing some that today. Fortunately there is no signs of active infection at this time on the left leg although on the right leg he does appear to have swelling and erythema as well as some mild warmth to touch. This does have been concerned about the possibility of cellulitis although  within the differential diagnosis I do think that potentially a DVT has to be at least considered. We need to rule that out before proceeding would just call in the cellulitis. Especially since he is having pain in the posterior aspect of his calf muscle. 2/8; the patient had seen sparingly. He has severe skin changes of chronic lymphedema in the left leg thickened hyperkeratotic verrucous skin. He has an open wound on the medial part of the left first met head left mid tibia. He also has a rim of nonepithelialized skin in the anterior mid tibia. He brought in the AmLactin lotion that was been prescribed although I am not sure under compression and its utility. There concern about cellulitis on the right lower leg the last time he was here. He was put on on antibiotics. His DVT rule out was negative. The right leg looks fine he is using his stocking on this area 08/10/2020 upon evaluation today patient appears to be doing well with regard to his leg currently. He has been tolerating the dressing changes without complication. Fortunately there is no signs of active infection which is great news. Overall very pleased with where things stand. 2/22; the patient still has an area on the medial part of the left first met his head. This looks better than when I last saw this earlier this month he has a rim of epithelialization but still some surface debris. Mostly everything on the left leg is healed. There is still a vulnerable in the left mid tibia area. 08/30/2020 upon evaluation today patient appears to be doing much better in regard to his wounds on his foot. 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 Nonetheless I think we will address that today I Minna send in a prescription for him in that regard. Overall though there does not appear to be any signs of significant  worsening. 09/07/2020 on evaluation today patient's wounds over his left foot appear to be doing excellent. I do not see any signs of infection there is some callus buildup this can require debridement for certain but overall I feel like he is managing quite nicely. He still using the AmLactin cream which has been beneficial for him as well. 3/22; left foot  wound is closed. There is no open area here. He is using ammonium lactate lotion to the lower extremities to help exfoliate dry cracked skin. He has compression stockings from elastic therapy in [place]. The wound on the medial part of his left first met head is healed today. READMISSION 04/12/2021 Mr. Gasaway is a patient we know fairly well he had a prolonged stay in clinic in 2019 with wounds on his left lateral and left anterior lower extremity in the setting of chronic venous insufficiency. More recently he was here earlier this year with predominantly an area on his left foot first metatarsal head plantar and he says the plantar foot broke down on its not long after we discharged him but he did not come back here. The last few months areas of broken down on his left anterior and again the left lateral lower extremity. The leg itself is very swollen chronically enlarged a lot of hyperkeratotic dry Berry Q skin in the left lower leg. His edema extends well into the thigh. He was seen by Dr. Donzetta Matters. He had ABIs on 03/02/2021 showing an ABI on the right of 1 with a TBI of 0.72 his ABI in the left at 1.09 TBI of 0.99. Monophasic and biphasic waveforms on the right. On the left monophasic waveforms were noted he went on to have an angiogram on 03/27/2021 this showed the aortic aortic and iliac segments were free of flow-limiting stenosis the left common femoral vein to evaluate the left femoral to anterior tibial artery bypass was unobstructed the bypass was patent without any areas of stenosis. We discharged the patient in bilateral juxta lite  stockings but very clearly that was not sufficient to control the swelling and maintain skin integrity. He is clearly going to need compression pumps. 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 This is fortunate because he is on his feet for long periods of time. 10/27; patient comes in with our intake nurse reporting copious amount of green drainage from the left anterior mid tibia the left dorsal foot and to a lesser extent the left medial mid tibia. We left the compression wrap on all week for the amount of edema in his left leg is quite a bit better. We use silver alginate as the primary dressing 11/3; edema control is good. Left anterior lower leg left medial lower leg and the plantar first metatarsal head. The left anterior lower leg required debridement. Deep tissue culture I did of this wound showed MRSA I put him on 10 days of doxycycline which she will start today. We have him in compression wraps. He has a security card and [employer] however he is retiring on November 15. We will need to then get him into a better offloading boot for the left foot perhaps a total contact cast 11/10; edema control is quite good. Left anterior and left medial lower leg wounds in the setting of chronic venous insufficiency and lymphedema. He also has a substantial area over the left plantar first metatarsal head. 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 He has significant hypergranulation on the left plantar foot wound. The patient is a diabetic but he does not have significant PAD 11/17; edema control is quite good. Left anterior and left medial lower leg wounds look better. The really concerning area remains the area  on the left plantar first metatarsal head. He has a rim of epithelialization. He has been using a surgical shoe The patient is now retired from a a  [employer] I have gone over with him the need to offload this area aggressively. Starting today with a forefoot off loader but . possibly a total contact cast. 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 Arrives with a very significant odor from I think all of the wounds on the left leg and the left foot. Because of this I did not put a total contact cast on him today but will could still consider this. His wife was having cataract surgery which is the reason he missed the appointment 12/6. I saw this man 5 days ago with a swelling below the popliteal fossa. I thought he actually might have a Baker's cyst however the DVT rule out study that we could arrange right away was negative the technician told me this was not a ruptured Baker's cyst. We attempted to get this aspirated by under ultrasound guidance in interventional radiology however all they did was an ultrasound however it shows an extensive fluid collection 62 x 8 x 9.4 in the left thigh and left calf. The patient states he thinks this started 8 days ago or so but he really is not complaining of any pain, fever or systemic symptoms. He has not ha 12/20; after some difficulty I managed to get the patient into see Dr. Donzetta Matters. Eventually he was taken into the hospital and had a drain put in the fluid collection below his left knee posteriorly extending into the posterior thigh. He still has the drain in place. Culture of this showed moderate staff aureus few Morganella and few Klebsiella he is now on doxycycline and ciprofloxacin as suggested by infectious disease he is on this for a month. The drain will remain in place until it stops draining 12/29; he comes in today with the 1 wound on his left leg and the area on the left plantar first met head significantly smaller. Both look healthy. He still has the drain in the left leg. He says  he has to change this daily. Follows up with Dr. Donzetta Matters on January 11. 06/29/2021; the wounds that I am following on the left leg and left first met head continued to be quite healthy. However the area where his inferior drain is in place had copious amounts of drainage which was green in color. The wound here is larger. Follows up with Dr. Gwenlyn Saran of vein and vascular his surgeon next week as well as infectious disease. He remains on ciprofloxacin and doxycycline. He is not complaining of excessive pain in either one of the drain areas 1/12; the patient saw vascular surgery and infectious disease. Vascular surgery has left the drain in place as there was still some notable drainage still see him back in 2 weeks. Dr. Velna Ochs stop the doxycycline and ciprofloxacin and I do not believe he follows up with them at this point. Culture I did last week showed both doxycycline resistant MRSA and Pseudomonas not sensitive to ciprofloxacin although only in rare titers 1/19; the patient's wound on the left anterior lower leg is just about healed. We have continued healing of the area that was medially on the left leg. Left first plantar metatarsal head continues to get smaller. The major problem here  is his 2 drain sites 1 on the left upper calf and lateral thigh. There is purulent drainage still from the left lateral thigh. I gave him antibiotics last week but we still have recultured. He has the drain in the area I think this is eventually going to have to come out. I suspect there will be a connecting wound to heal here perhaps with improved VAc Electronic Signature(s) Signed: 07/13/2021 4:44:32 PM By: Linton Ham MD Entered By: Linton Ham on 07/13/2021 15:18:22 -------------------------------------------------------------------------------- Physical Exam Details Patient Name: Date of Service: [name]. 07/13/2021 2:00 PM Medical Record Number: [id number] Patient Account Number: [id number] Date of  Birth/Sex: Treating RN: 03/14/1951 (71 y.o. [name] Primary Care Provider: [name] Other Clinician: Referring Provider: Treating Provider/Extender: Stormy Card in Treatment: 13 Constitutional Sitting or standing Blood Pressure is within target range for patient.. Pulse regular and within target range for patient.Marland Kitchen Respirations regular, non-labored and within target range.. Temperature is normal and within the target range for the patient.Marland Kitchen Appears in no distress. Notes Wound exam; left anterior leg wound is just about closed. The medial wound is closed. First metatarsal head is better. These were the original wounds we were following He has the drain issues. Still purulent looking material coming out of the Wound per our intake nurse. Another culture is off. I think the sooner the drain comes out the better. He says he is still leaking fluid from the upper drain site however. Not sure if he needs additional imaging Electronic Signature(s) Signed: 07/13/2021 4:44:32 PM By: Linton Ham MD Entered By: Linton Ham on 07/13/2021 15:19:40 -------------------------------------------------------------------------------- Physician Orders Details Patient Name: Date of Service: [name]. 07/13/2021 2:00 PM Medical Record Number: [id number] Patient Account Number: [id number] Date of Birth/Sex: Treating RN: [birth year] (71 y.o. [name] Primary Care Provider: [name] Other Clinician: Referring Provider: Treating Provider/Extender: Stormy Card in Treatment: [phone] Verbal / Phone Orders: No Diagnosis Coding Follow-up Appointments ppointment in 1 week. Dellia Nims on Thursday Return A Nurse Visit: - Monday!!! Bathing/ Shower/ Hygiene May shower with protection but do not get wound dressing(s) wet. - Use a cast protector so you can shower without getting your wrap(s) wet Edema Control - Lymphedema / SCD /  Other Elevate legs to the level of the heart or above for 30 minutes daily and/or when sitting, a frequency of: - throughout the day Avoid standing for long periods of time. Patient to wear own compression stockings every day. - on right leg; Moisturize legs daily. - Ammonium LACTATE to BLE every day. Off-Loading Wedge shoe to: - left front foot offloader Wound Treatment Wound #18 - Metatarsal head first Wound Laterality: Plantar, Left Cleanser: Soap and Water 2 x Per Week/7 Days Discharge Instructions: May shower and wash wound with dial antibacterial soap and water prior to dressing change. Cleanser: Wound Cleanser 2 x Per Week/7 Days Discharge Instructions: Cleanse the wound with wound cleanser prior to applying a clean dressing using gauze sponges, not tissue or cotton balls. Peri-Wound Care: Triamcinolone 15 (g) 2 x Per Week/7 Days Discharge Instructions: Use triamcinolone 15 (g) as directed Peri-Wound Care: Zinc Oxide Ointment 30g tube 2 x Per Week/7 Days Discharge Instructions: Apply Zinc Oxide to periwound with each dressing change Peri-Wound Care: Sween Lotion (Moisturizing lotion) 2 x Per Week/7 Days Discharge Instructions: Apply moisturizing lotion as directed Prim Dressing: Hydrofera Blue Classic Foam, 4x4 in 2 x Per Week/7 Days ary Discharge Instructions: Moisten  with saline prior to applying to wound bed Secondary Dressing: ABD Pad, 5x9 2 x Per Week/7 Days Discharge Instructions: Apply over primary dressing as directed. Secondary Dressing: Zetuvit Plus 4x8 in 2 x Per Week/7 Days Discharge Instructions: Apply over primary dressing as directed. Secondary Dressing: CarboFLEX Odor Control Dressing, 4x4 in 2 x Per Week/7 Days Discharge Instructions: Apply over primary dressing as directed. Compression Wrap: CoFlex TLC XL 2-layer Compression System 4x7 (in/yd) 2 x Per Week/7 Days Discharge Instructions: Apply CoFlex 2-layer compression as directed. (alt for 4 layer) Wound #19 -  Lower Leg Wound Laterality: Left, Anterior Cleanser: Soap and Water 2 x Per Week/7 Days Discharge Instructions: May shower and wash wound with dial antibacterial soap and water prior to dressing change. Cleanser: Wound Cleanser 2 x Per Week/7 Days Discharge Instructions: Cleanse the wound with wound cleanser prior to applying a clean dressing using gauze sponges, not tissue or cotton balls. Peri-Wound Care: Triamcinolone 15 (g) 2 x Per Week/7 Days Discharge Instructions: Use triamcinolone 15 (g) as directed Peri-Wound Care: Zinc Oxide Ointment 30g tube 2 x Per Week/7 Days Discharge Instructions: Apply Zinc Oxide to periwound with each dressing change Peri-Wound Care: Sween Lotion (Moisturizing lotion) 2 x Per Week/7 Days Discharge Instructions: Apply moisturizing lotion as directed Prim Dressing: Hydrofera Blue Classic Foam, 4x4 in 2 x Per Week/7 Days ary Discharge Instructions: Moisten with saline prior to applying to wound bed Secondary Dressing: ABD Pad, 5x9 2 x Per Week/7 Days Discharge Instructions: Apply over primary dressing as directed. Secondary Dressing: Zetuvit Plus 4x8 in 2 x Per Week/7 Days Discharge Instructions: Apply over primary dressing as directed. Secondary Dressing: CarboFLEX Odor Control Dressing, 4x4 in 2 x Per Week/7 Days Discharge Instructions: Apply over primary dressing as directed. Compression Wrap: CoFlex TLC XL 2-layer Compression System 4x7 (in/yd) 2 x Per Week/7 Days Discharge Instructions: Apply CoFlex 2-layer compression as directed. (alt for 4 layer) Wound #22 - Lower Leg Wound Laterality: Left, Lateral Cleanser: Soap and Water 2 x Per Week/7 Days Discharge Instructions: May shower and wash wound with dial antibacterial soap and water prior to dressing change. Cleanser: Wound Cleanser 2 x Per Week/7 Days Discharge Instructions: Cleanse the wound with wound cleanser prior to applying a clean dressing using gauze sponges, not tissue or cotton  balls. Peri-Wound Care: Triamcinolone 15 (g) 2 x Per Week/7 Days Discharge Instructions: Use triamcinolone 15 (g) as directed Peri-Wound Care: Zinc Oxide Ointment 30g tube 2 x Per Week/7 Days Discharge Instructions: Apply Zinc Oxide to periwound with each dressing change Peri-Wound Care: Sween Lotion (Moisturizing lotion) 2 x Per Week/7 Days Discharge Instructions: Apply moisturizing lotion as directed Prim Dressing: KerraCel Ag Gelling Fiber Dressing, 4x5 in (silver alginate) 2 x Per Week/7 Days ary Discharge Instructions: Apply silver alginate to wound bed as instructed Secondary Dressing: ABD Pad, 5x9 2 x Per Week/7 Days Discharge Instructions: Apply over primary dressing as directed. Secondary Dressing: Zetuvit Plus 4x8 in 2 x Per Week/7 Days Discharge Instructions: Apply over primary dressing as directed. Secondary Dressing: CarboFLEX Odor Control Dressing, 4x4 in 2 x Per Week/7 Days Discharge Instructions: Apply over primary dressing as directed. Compression Wrap: CoFlex TLC XL 2-layer Compression System 4x7 (in/yd) 2 x Per Week/7 Days Discharge Instructions: Apply CoFlex 2-layer compression as directed. (alt for 4 layer) Laboratory naerobe culture (MICRO) - Left Lat.Leg Bacteria identified in Unspecified specimen by A LOINC Code: 742-5 Convenience Name: Anerobic culture Electronic Signature(s) Signed: 07/13/2021 4:44:32 PM By: Linton Ham MD Signed: 07/13/2021 5:47:51 PM  By: Rhae Hammock RN Entered By: Rhae Hammock on 07/13/2021 15:38:42 -------------------------------------------------------------------------------- Problem List Details Patient Name: Date of Service: [name]. 07/13/2021 2:00 PM Medical Record Number: [id number] Patient Account Number: [id number] Date of Birth/Sex: Treating RN: 1951-06-10 (71 y.o. [name], [name] Primary Care Provider: [name] Other Clinician: Referring Provider: Treating Provider/Extender: Stormy Card in Treatment: 13 Active Problems ICD-10 Encounter Code Description Active Date MDM Diagnosis E11.621 Type 2 diabetes mellitus with foot ulcer 04/12/2021 No Yes 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 04/12/2021 No Yes I89.0 Lymphedema, not elsewhere classified 04/12/2021 No Yes I87.322 Chronic venous hypertension (idiopathic) with inflammation of left lower 04/12/2021 No Yes extremity 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 ulcer of other part of left lower leg with other specified 04/12/2021 No Yes severity 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 of other part of left foot with other specified 04/12/2021 No Yes severity E11.42 Type 2 diabetes mellitus with diabetic polyneuropathy 04/12/2021 No Yes L02.416 Cutaneous abscess of left lower limb 06/13/2021 No Yes Inactive Problems Resolved Problems Electronic Signature(s) Signed: 07/13/2021 4:44:32 PM By: Linton Ham MD Entered By: Linton Ham on 07/13/2021 15:15:33 -------------------------------------------------------------------------------- Progress Note Details Patient Name: Date of Service: [name]. 07/13/2021 2:00 PM Medical Record Number: [id number] Patient Account Number: [id number] Date of Birth/Sex: Treating RN: 05/10/1951 (71 y.o. [name] Primary Care Provider: [name] Other Clinician: Referring Provider: Treating Provider/Extender: Stormy Card in Treatment: 13 Subjective History of Present Illness (HPI) 10/11/17; Mr. Saraceno is a 71 year old man who tells me that in 2015 he slipped down the latter traumatizing his left leg. He developed a wound in the same spot the area that we are currently looking at. He states this closed over for the most part although he always felt it was somewhat unstable. In 2016 he hit the same area with the door of his car had this reopened. He tells me that this is never really closed  although sometimes an inflow it remains open on a constant basis. He has not been using any specific dressing to this except for topical antibiotics the nature of which were not really sure. His primary doctor did send him to see Dr. Einar [name] of interventional cardiology. He underwent an angiogram on 08/06/17 and he underwent a PTA and directional atherectomy of the lesser distal SFA and popliteal arteries which resulted in brisk improvement in blood flow. It was noted that he had 2 vessel runoff through the anterior tibial and peroneal. He is also been to see vascular and interventional radiologist. He was not felt to have any significant superficial venous insufficiency. Presumably is not a candidate for any ablation. It was suggested he come here for wound care. The patient is a type II diabetic on insulin. He also has a history of venous insufficiency. ABIs on the left were noncompressible in our clinic 10/21/17; patient we admitted to the clinic last week. 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 We put Iodosorb on him after an aggressive debridement and 3 layer compression. He complained of pain in his ankle and itching with is skin in fact he scratched the area on the medial calf superiorly at the rim of our wraps and he has 2 small open areas in that location today which are new. I changed his primary dressing today to silver collagen. As noted he is already had revascularization and does not have  any significant superficial venous insufficiency that would be amenable to ablation 10/28/17; patient admitted to the clinic 2 weeks ago. He has a smaller Wound. Scratch injury from last week revealed. There is large wound over the tibial area. This is smaller. Granulation looks healthy. No need for debridement. 11/04/17; the wound on the left lateral calf looks better. Improved dimensions. Surface of this looks better. We've been  maintaining him and Kerlix Coban wraps. He finds this much more comfortable. Silver collagen dressing 11/11/17; left lateral Wound continues to look healthy be making progress. Using a #5 curet I removed removed nonviable skin from the surface of the wound and then necrotic debris from the wound surface. Surface of the wound continues to look healthy. ooHe also has an open area on the left great toenail bed. We've been using topical antibiotics. 11/19/17; left anterior lateral wound continues to look healthy but it's not closed. ooHe also had a small wound above this on the left leg ooInitially traumatic wounds in the setting of significant chronic venous insufficiency and stasis dermatitis 11/25/17; left anterior wounds superiorly is closed still a small wound inferiorly. 12/02/17; left anterior tibial area. Arrives today with adherent callus. Post debridement clearly not completely closed. Hydrofera Blue under 3 layer compression. 12/09/17; left anterior tibia. Circumferential eschar however the wound bed looks stable to improved. We've been using Hydrofera Blue under 3 layer compression 12/17/17; left anterior tibia. Apparently this was felt to be closed however when the wrap was taken off there is a skin tear to reopen wounds in the same area we've been using Hydrofera Blue under 3 layer compression 12/23/17 left anterior tibia. Not close to close this week apparently the [facility] was stuck to this again. Still circumferential eschar requiring debridement. I put a contact layer on this this time under the Hydrofera Blue 12/31/17; left anterior tibia. Wound is better slight amount of hyper-granulation. Using Hydrofera Blue over Adaptic. 01/07/18; left anterior tibia. The wound had some surface eschar however after this was removed he has no open wound.he was already revascularized by Dr. Einar [name] when he came to our clinic with atherectomy of the left SFA and popliteal artery. He was also sent to  interventional radiology for venous reflux studies. He was not felt to have significant reflux but certainly has chronic venous changes of his skin with hemosiderin deposition around this area. He will definitely need to lubricate his skin and wear compression stocking and I've talked to him about this. READMISSION 05/26/2018 This is a now 71 year old man we cared for with traumatic wounds on his left anterior lower extremity. He had been previously revascularized during that admission by Dr. Einar [name]. Apparently in follow-up Dr. Einar [name] noted that he had deterioration in his arterial status. He underwent a stent placement in the distal left SFA on 04/22/2018. 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 He went back to the angiography suite on 04/30/2018 he underwent PTA and balloon angioplasty of the occluded left mid anterior tibial artery, thrombotic occlusion went from 100 to 0% which reconstitutes the posterior tibial artery. He had thrombectomy and aspiration of the peroneal artery. The stent placed in the distal SFA left SFA was still occluded. 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 He  has been recuperating at [facility] in [facility] after the most recent hospitalization. He is going home tomorrow. He tells me that 2 weeks ago he traumatized the tip of his left fifth toe. He came in urgently for our review of this. This was a history of before I noted that Dr. Einar [name] had already noted dry gangrenous changes of the left fifth toe 06/09/2018; 2-week follow-up. I did contact Dr. Einar [name] after his last appointment and he apparently saw 1 of Dr. Irven Shelling colleagues the next day. He does not follow-up with Dr. Einar [name] himself until  Thursday of this week. 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 Nevertheless there is no evidence of infection no drainage and no pain. He had a new area that this week when we were signing him in today on the left anterior mid tibia area, this is in close proximity to the previous wound we have dealt with in this clinic. 06/23/2018; 2-week follow-up. I did not receive a recent note from Dr. Einar [name] to review today. Our office is trying to obtain this. He is apparently not planning to do further vascular interventions and wondered about compression to try and help with the patient's chronic venous insufficiency. However we are also concerned about the arterial flow. ooHe arrives in clinic today with a new area on the left third toe. The areas on the calf/anterior tibia are close to closing. The left fifth toe is still mummified using Betadine. -In reviewing things with the patient he has what sounds like claudication with mild to moderate amount of activity. 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 I prescribed Levaquin over the phone while we attempted to arrange a plan of care. However the patient called yesterday to report he had low-grade fever and he came in today acutely. There is been a marked deterioration in the left third toe with spreading cellulitis up into the dorsal left foot. He was referred to the emergency room. Readmission: 06/29/2020 patient presents today for reevaluation here in our clinic he was previously treated by Dr. Dellia Nims at the latter part of 2019 in 2 the beginning of 2020. Subsequently we have not seen him since that time in the interim he did have evaluation with vein and vascular specialist specifically Dr. Anice Paganini who did perform quite extensive work for a left femoral to anterior tibial artery bypass. With that being said in the interim the patient has developed significant lymphedema and has wounds that he  tells me have really never healed in regard to the incision site on the left leg. He also has multiple wounds on the feet for various reasons some of which is that he tends to pick at his feet. Fortunately there is no signs of active infection systemically at this time he does have some wounds that are little bit deeper but most are fairly superficial he seems to have good blood flow and overall everything appears to be healthy I see no bone exposed and no obvious signs of osteomyelitis. I do not know that he necessarily needs a x-ray at this point although that something we could consider depending on how things progress. The patient does have a history of lymphedema, diabetes, this is type II, chronic kidney disease stage III, hypertension, and history of peripheral vascular disease. 07/05/2020; patient admitted last week. Is a patient I remember from 2019 he had a spreading infection involving the left foot and we  sent him to the hospital. 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 He subsequently had a left femoral to anterior tibial bypass by Dr.Cain vein and vascular. He also has severe lymphedema with chronic skin changes related to that on the left leg. The most problematic area that was new today was on the left medial great toe. This was apparently a small area last week there was purulent drainage which our intake nurse cultured. Also areas on the left medial foot and heel left lateral foot. He has 2 areas on the left medial calf left lateral calf in the setting of the severe lymphedema. 07/13/2020 on evaluation today patient appears to be doing better in my opinion compared to his last visit. The good news is there is no signs of active infection systemically and locally I do not see any signs of infection either. He did have an x-ray which was negative that is great news he had a culture which showed MRSA but at the same time he is been on the  doxycycline which has helped. I do think we may want to extend this for 7 additional days 1/25; patient admitted to the clinic a few weeks ago. 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 We have been putting him under compression his edema control is a lot better but he is severe verricused skin on the left leg. He is really done quite well he still has an open area on the left medial calf and the left medial first metatarsal head. We have been using silver collagen on the leg silver alginate on the foot 07/27/2020 upon evaluation today patient appears to be doing decently well in regard to his wounds. He still has a lot of dry skin on the left leg. Some of this is starting to peel back and I think he may be able to have them out by removing some that today. Fortunately there is no signs of active infection at this time on the left leg although on the right leg he does appear to have swelling and erythema as well as some mild warmth to touch. This does have been concerned about the possibility of cellulitis although within the differential diagnosis I do think that potentially a DVT has to be at least considered. We need to rule that out before proceeding would just call in the cellulitis. Especially since he is having pain in the posterior aspect of his calf muscle. 2/8; the patient had seen sparingly. He has severe skin changes of chronic lymphedema in the left leg thickened hyperkeratotic verrucous skin. He has an open wound on the medial part of the left first met head left mid tibia. He also has a rim of nonepithelialized skin in the anterior mid tibia. He brought in the AmLactin lotion that was been prescribed although I am not sure under compression and its utility. There concern about cellulitis on the right lower leg the last time he was here. He was put on on antibiotics. His DVT rule out was negative. The right leg looks fine he is using his stocking  on this area 08/10/2020 upon evaluation today patient appears to be doing well with regard to his leg currently. He has been tolerating the dressing changes without complication. Fortunately there is no signs of active infection which is great news. Overall very pleased with where things stand. 2/22; the patient still has an area on the  medial part of the left first met his head. This looks better than when I last saw this earlier this month he has a rim of epithelialization but still some surface debris. Mostly everything on the left leg is healed. There is still a vulnerable in the left mid tibia area. 08/30/2020 upon evaluation today patient appears to be doing much better in regard to his wounds on his foot. 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 Nonetheless I think we will address that today I Minna send in a prescription for him in that regard. Overall though there does not appear to be any signs of significant worsening. 09/07/2020 on evaluation today patient's wounds over his left foot appear to be doing excellent. I do not see any signs of infection there is some callus buildup this can require debridement for certain but overall I feel like he is managing quite nicely. He still using the AmLactin cream which has been beneficial for him as well. 3/22; left foot wound is closed. There is no open area here. He is using ammonium lactate lotion to the lower extremities to help exfoliate dry cracked skin. He has compression stockings from elastic therapy in [place]. The wound on the medial part of his left first met head is healed today. READMISSION 04/12/2021 Mr. Shipp is a patient we know fairly well he had a prolonged stay in clinic in 2019 with wounds on his left lateral and left anterior lower extremity in the setting of chronic venous insufficiency. More  recently he was here earlier this year with predominantly an area on his left foot first metatarsal head plantar and he says the plantar foot broke down on its not long after we discharged him but he did not come back here. The last few months areas of broken down on his left anterior and again the left lateral lower extremity. The leg itself is very swollen chronically enlarged a lot of hyperkeratotic dry Berry Q skin in the left lower leg. His edema extends well into the thigh. He was seen by Dr. Donzetta Matters. He had ABIs on 03/02/2021 showing an ABI on the right of 1 with a TBI of 0.72 his ABI in the left at 1.09 TBI of 0.99. Monophasic and biphasic waveforms on the right. On the left monophasic waveforms were noted he went on to have an angiogram on 03/27/2021 this showed the aortic aortic and iliac segments were free of flow-limiting stenosis the left common femoral vein to evaluate the left femoral to anterior tibial artery bypass was unobstructed the bypass was patent without any areas of stenosis. We discharged the patient in bilateral juxta lite stockings but very clearly that was not sufficient to control the swelling and maintain skin integrity. He is clearly going to need compression pumps. 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 This is fortunate because he is on his feet for long periods of time. 10/27; patient comes in with our intake nurse reporting copious amount of green drainage from the left anterior mid tibia the left dorsal foot and to a lesser extent the left medial mid tibia. We left the compression wrap on all week for the amount of edema in his left leg is quite a bit better. We use silver alginate as the primary dressing  11/3; edema control is good. Left anterior lower leg left medial lower leg and the plantar first metatarsal head. The left anterior lower leg required debridement. Deep tissue culture I did of this wound showed  MRSA I put him on 10 days of doxycycline which she will start today. We have him in compression wraps. He has a security card and [employer] however he is retiring on November 15. We will need to then get him into a better offloading boot for the left foot perhaps a total contact cast 11/10; edema control is quite good. Left anterior and left medial lower leg wounds in the setting of chronic venous insufficiency and lymphedema. He also has a substantial area over the left plantar first metatarsal head. 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 He has significant hypergranulation on the left plantar foot wound. The patient is a diabetic but he does not have significant PAD 11/17; edema control is quite good. Left anterior and left medial lower leg wounds look better. The really concerning area remains the area on the left plantar first metatarsal head. He has a rim of epithelialization. 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 Starting today with a forefoot off loader but . possibly a total contact cast. 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 Arrives with a very significant odor from I think all of the wounds on the left leg and the left foot. Because of this I did not put a total contact cast on him today but will could still consider this. His wife was having cataract surgery which is the reason he missed the appointment 12/6. I saw this man 5 days ago with a swelling below the popliteal fossa. I thought he actually might have a Baker's cyst however the DVT rule out study that we could arrange right away was negative the technician told me this was not a ruptured Baker's cyst. We  attempted to get this aspirated by under ultrasound guidance in interventional radiology however all they did was an ultrasound however it shows an extensive fluid collection 62 x 8 x 9.4 in the left thigh and left calf. The patient states he thinks this started 8 days ago or so but he really is not complaining of any pain, fever or systemic symptoms. He has not ha 12/20; after some difficulty I managed to get the patient into see Dr. Donzetta Matters. Eventually he was taken into the hospital and had a drain put in the fluid collection below his left knee posteriorly extending into the posterior thigh. He still has the drain in place. Culture of this showed moderate staff aureus few Morganella and few Klebsiella he is now on doxycycline and ciprofloxacin as suggested by infectious disease he is on this for a month. The drain will remain in place until it stops draining 12/29; he comes in today with the 1 wound on his left leg and the area on the left plantar first met head significantly smaller. Both look healthy. He still has the drain in the left leg. He says he has to change this daily. Follows up with Dr. Donzetta Matters on January 11. 06/29/2021; the wounds  that I am following on the left leg and left first met head continued to be quite healthy. However the area where his inferior drain is in place had copious amounts of drainage which was green in color. The wound here is larger. Follows up with Dr. Gwenlyn Saran of vein and vascular his surgeon next week as well as infectious disease. He remains on ciprofloxacin and doxycycline. He is not complaining of excessive pain in either one of the drain areas 1/12; the patient saw vascular surgery and infectious disease. Vascular surgery has left the drain in place as there was still some notable drainage still see him back in 2 weeks. Dr. Velna Ochs stop the doxycycline and ciprofloxacin and I do not believe he follows up with them at this point. Culture I did last week showed both  doxycycline resistant MRSA and Pseudomonas not sensitive to ciprofloxacin although only in rare titers 1/19; the patient's wound on the left anterior lower leg is just about healed. We have continued healing of the area that was medially on the left leg. Left first plantar metatarsal head continues to get smaller. The major problem here is his 2 drain sites 1 on the left upper calf and lateral thigh. There is purulent drainage still from the left lateral thigh. I gave him antibiotics last week but we still have recultured. He has the drain in the area I think this is eventually going to have to come out. I suspect there will be a connecting wound to heal here perhaps with improved VAc Objective Constitutional Sitting or standing Blood Pressure is within target range for patient.. Pulse regular and within target range for patient.Marland Kitchen Respirations regular, non-labored and within target range.. Temperature is normal and within the target range for the patient.Marland Kitchen Appears in no distress. Vitals Time Taken: 2:07 PM, Height: 74 in, Weight: 238 lbs, BMI: 30.6, Temperature: 98.7 F, Pulse: 64 bpm, Respiratory Rate: 17 breaths/min, Blood Pressure: 134/74 mmHg, Capillary Blood Glucose: 167 mg/dl. General Notes: Wound exam; left anterior leg wound is just about closed. The medial wound is closed. First metatarsal head is better. These were the original wounds we were following He has the drain issues. Still purulent looking material coming out of the Wound per our intake nurse. Another culture is off. I think the sooner the drain comes out the better. He says he is still leaking fluid from the upper drain site however. Not sure if he needs additional imaging Integumentary (Hair, Skin) Wound #18 status is Open. Original cause of wound was Gradually Appeared. The date acquired was: 08/23/2020. The wound has been in treatment 13 weeks. The wound is located on the Left,Plantar Metatarsal head first. The wound measures  1.7cm length x 2.4cm width x 0.1cm depth; 3.204cm^2 area and 0.32cm^3 volume. There is no tunneling or undermining noted. There is a medium amount of serosanguineous drainage noted. The wound margin is distinct with the outline attached to the wound base. There is large (67-100%) red, pink granulation within the wound bed. There is no necrotic tissue within the wound bed. Wound #19 status is Open. Original cause of wound was Gradually Appeared. The date acquired was: 08/23/2020. The wound has been in treatment 13 weeks. The wound is located on the Left,Anterior Lower Leg. The wound measures 0.2cm length x 0.2cm width x 0.2cm depth; 0.031cm^2 area and 0.006cm^3 volume. There is no tunneling or undermining noted. There is a medium amount of serosanguineous drainage noted. The wound margin is distinct with the outline attached to  the wound base. There is large (67-100%) red, pink granulation within the wound bed. There is no necrotic tissue within the wound bed. Wound #22 status is Open. Original cause of wound was Bump. The date acquired was: 06/03/2021. The wound has been in treatment 5 weeks. The wound is located on the Left,Lateral Lower Leg. The wound measures 3.4cm length x 2.4cm width x 1cm depth; 6.409cm^2 area and 6.409cm^3 volume. There is Fat Layer (Subcutaneous Tissue) exposed. Tunneling has been noted at 12:00 with a maximum distance of 15cm. Undermining begins at 10:00 and ends at 2:00 with a maximum distance of 2cm. There is a large amount of purulent drainage noted. The wound margin is distinct with the outline attached to the wound base. There is medium (34-66%) red, pink granulation within the wound bed. There is a medium (34-66%) amount of necrotic tissue within the wound bed including Adherent Slough. 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 There was a Four Layer Compression Therapy Procedure by Rhae Hammock, RN. Post procedure Diagnosis Wound #18: Same as Pre-Procedure Wound #19 Pre-procedure diagnosis of Wound #19 is a Diabetic Wound/Ulcer of the Lower Extremity located on the Left,Anterior Lower Leg . There was a Four Layer Compression Therapy Procedure by Rhae Hammock, RN. Post procedure Diagnosis Wound #19: Same as Pre-Procedure Wound #22 Pre-procedure diagnosis of Wound #22 is a Cyst located on the Left,Lateral Lower Leg . There was a Four Layer Compression Therapy Procedure by Rhae Hammock, RN. Post procedure Diagnosis Wound #22: Same as Pre-Procedure Plan Follow-up Appointments: Return Appointment in 1 week. Dellia Nims on Thursday Nurse Visit: - Monday!!! Bathing/ Shower/ Hygiene: May shower with protection but do not get wound dressing(s) wet. - Use a cast protector so you can shower without getting your wrap(s) wet Edema Control - Lymphedema / SCD / Other: Elevate legs to the level of the heart or above for 30 minutes daily and/or when sitting, a frequency of: - throughout the day Avoid standing for long periods of time. Patient to wear own compression stockings every day. - on right leg; Moisturize legs daily. - Ammonium LACTATE to BLE every day. Off-Loading: Wedge shoe to: - left front foot offloader WOUND #18: - Metatarsal head first Wound Laterality: Plantar, Left Cleanser: Soap and  Water 2 x Per Week/7 Days Discharge Instructions: May shower and wash wound with dial antibacterial soap and water prior to dressing change. Cleanser: Wound Cleanser 2 x Per Week/7 Days Discharge Instructions: Cleanse the wound with wound cleanser prior to applying a clean dressing using gauze sponges, not tissue or cotton balls. Peri-Wound Care: Triamcinolone 15 (g) 2 x Per Week/7 Days Discharge Instructions: Use triamcinolone 15 (g) as directed Peri-Wound Care: Zinc Oxide Ointment 30g tube 2 x Per Week/7 Days Discharge Instructions: Apply Zinc Oxide to periwound with each dressing change Peri-Wound Care: Sween Lotion (Moisturizing lotion) 2 x Per Week/7 Days Discharge Instructions: Apply moisturizing lotion as directed Prim Dressing: Hydrofera Blue Classic Foam, 4x4  in 2 x Per Week/7 Days ary Discharge Instructions: Moisten with saline prior to applying to wound bed Secondary Dressing: ABD Pad, 5x9 2 x Per Week/7 Days Discharge Instructions: Apply over primary dressing as directed. Secondary Dressing: Zetuvit Plus 4x8 in 2 x Per Week/7 Days Discharge Instructions: Apply over primary dressing as directed. Secondary Dressing: CarboFLEX Odor Control Dressing, 4x4 in 2 x Per Week/7 Days Discharge Instructions: Apply over primary dressing as directed. Com pression Wrap: CoFlex TLC XL 2-layer Compression System 4x7 (in/yd) 2 x Per Week/7 Days Discharge Instructions: Apply CoFlex 2-layer compression as directed. (alt for 4 layer) WOUND #19: - Lower Leg Wound Laterality: Left, Anterior Cleanser: Soap and Water 2 x Per Week/7 Days Discharge Instructions: May shower and wash wound with dial antibacterial soap and water prior to dressing change. Cleanser: Wound Cleanser 2 x Per Week/7 Days Discharge Instructions: Cleanse the wound with wound cleanser prior to applying a clean dressing using gauze sponges, not tissue or cotton balls. Peri-Wound Care: Triamcinolone 15 (g) 2 x Per Week/7  Days Discharge Instructions: Use triamcinolone 15 (g) as directed Peri-Wound Care: Zinc Oxide Ointment 30g tube 2 x Per Week/7 Days Discharge Instructions: Apply Zinc Oxide to periwound with each dressing change Peri-Wound Care: Sween Lotion (Moisturizing lotion) 2 x Per Week/7 Days Discharge Instructions: Apply moisturizing lotion as directed Prim Dressing: Hydrofera Blue Classic Foam, 4x4 in 2 x Per Week/7 Days ary Discharge Instructions: Moisten with saline prior to applying to wound bed Secondary Dressing: ABD Pad, 5x9 2 x Per Week/7 Days Discharge Instructions: Apply over primary dressing as directed. Secondary Dressing: Zetuvit Plus 4x8 in 2 x Per Week/7 Days Discharge Instructions: Apply over primary dressing as directed. Secondary Dressing: CarboFLEX Odor Control Dressing, 4x4 in 2 x Per Week/7 Days Discharge Instructions: Apply over primary dressing as directed. Com pression Wrap: CoFlex TLC XL 2-layer Compression System 4x7 (in/yd) 2 x Per Week/7 Days Discharge Instructions: Apply CoFlex 2-layer compression as directed. (alt for 4 layer) WOUND #22: - Lower Leg Wound Laterality: Left, Lateral Cleanser: Soap and Water 2 x Per Week/7 Days Discharge Instructions: May shower and wash wound with dial antibacterial soap and water prior to dressing change. Cleanser: Wound Cleanser 2 x Per Week/7 Days Discharge Instructions: Cleanse the wound with wound cleanser prior to applying a clean dressing using gauze sponges, not tissue or cotton balls. Peri-Wound Care: Triamcinolone 15 (g) 2 x Per Week/7 Days Discharge Instructions: Use triamcinolone 15 (g) as directed Peri-Wound Care: Zinc Oxide Ointment 30g tube 2 x Per Week/7 Days Discharge Instructions: Apply Zinc Oxide to periwound with each dressing change Peri-Wound Care: Sween Lotion (Moisturizing lotion) 2 x Per Week/7 Days Discharge Instructions: Apply moisturizing lotion as directed Prim Dressing: KerraCel Ag Gelling Fiber Dressing,  4x5 in (silver alginate) 2 x Per Week/7 Days ary Discharge Instructions: Apply silver alginate to wound bed as instructed Secondary Dressing: ABD Pad, 5x9 2 x Per Week/7 Days Discharge Instructions: Apply over primary dressing as directed. Secondary Dressing: Zetuvit Plus 4x8 in 2 x Per Week/7 Days Discharge Instructions: Apply over primary dressing as directed. Secondary Dressing: CarboFLEX Odor Control Dressing, 4x4 in 2 x Per Week/7 Days Discharge Instructions: Apply over primary dressing as directed. Com pression Wrap: CoFlex TLC XL 2-layer Compression System 4x7 (in/yd) 2 x Per Week/7 Days Discharge Instructions: Apply CoFlex 2-layer compression as directed. (alt for 4 layer) 1. Continue with Hydrofera Blue based dressings to the wounds on the left foot and the anterior left leg which is just  about closed 2. We are still putting silver alginate in the areas where the drain is in place especially in the calf wound. 3. The areas where the drainage is may be very difficult to heal once this comes out. We did a culture today but no empiric antibiotics 4. I wonder about imaging the thigh area again but I am going to leave that to vascular surgery who sees him next week Electronic Signature(s) Signed: 07/13/2021 4:44:32 PM By: Linton Ham MD Entered By: Linton Ham on 07/13/2021 15:20:55 -------------------------------------------------------------------------------- SuperBill Details Patient Name: Date of Service: [name]. 07/13/2021 Medical Record Number: [id number] Patient Account Number: [id number] Date of Birth/Sex: Treating RN: [birth year] (71 y.o. [name], [place] Primary Care Provider: [name] Other Clinician: Referring Provider: Treating Provider/Extender: Stormy Card in Treatment: 13 Diagnosis Coding ICD-10 Codes Code Description E11.621 Type 2 diabetes mellitus with foot ulcer E11.51 Type 2 diabetes mellitus with diabetic  peripheral angiopathy without gangrene I89.0 Lymphedema, not elsewhere classified I87.322 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verity 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 of other part of left foot with other specified severity E11.42 Type 2 diabetes mellitus with diabetic polyneuropathy L02.416 Cutaneous abscess of left lower limb Facility Procedures CPT4 Code: 44695072 Description: (Facility Use Only) 29581LT - APPLY MULTLAY COMPRS LWR LT LEG Modifier: Quantity: 1 Physician Procedures : CPT4 Code Description Modifier [id number] - WC PHYS LEVEL 3 - EST PT ICD-10 Diagnosis Description 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verity L02.416 Cutaneous abscess of left lower limb I87.322  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 Quantity: 1 Electronic Signature(s) Signed: 07/13/2021 4:44:32 PM By: Linton Ham MD Entered By: Linton Ham on 07/13/2021 15:21:32

## 2021-07-17 ENCOUNTER — Encounter (HOSPITAL_BASED_OUTPATIENT_CLINIC_OR_DEPARTMENT_OTHER): Payer: Medicare Other | Admitting: Internal Medicine

## 2021-07-17 ENCOUNTER — Other Ambulatory Visit: Payer: Self-pay

## 2021-07-17 DIAGNOSIS — E11621 Type 2 diabetes mellitus with foot ulcer: Secondary | ICD-10-CM | POA: Diagnosis not present

## 2021-07-17 NOTE — Progress Notes (Signed)
AUTHER, LYERLY ([id number]) Visit Report for 07/17/2021 SuperBill Details Patient Name: Date of Service: [name], [name] 07/17/2021 Medical Record Number: [id number] Patient Account Number: [id number] Date of Birth/Sex: Treating RN: March 05, 1951 (71 y.o. [name] Primary Care Provider: [name] Other Clinician: Referring Provider: Treating Provider/Extender: Cornelious Bryant in Treatment: 13 Diagnosis Coding ICD-10 Codes Code Description E11.621 Type 2 diabetes mellitus with foot ulcer 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 I89.0 Lymphedema, not elsewhere classified I87.322 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verity 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 of other part of left foot with other specified severity E11.42 Type 2 diabetes mellitus with diabetic polyneuropathy L02.416 Cutaneous abscess of left lower limb Facility Procedures CPT4 Code Description Modifier Quantity [id number] (Facility Use Only) [id number] - APPLY MULTLAY COMPRS LWR LT LEG 1 Electronic Signature(s) Signed: 07/17/2021 12:01:12 PM By: Kalman Shan DO Signed: 07/17/2021 4:39:43 PM By: Baruch Gouty RN, BSN Entered By: Baruch Gouty on 07/17/2021 11:51:28

## 2021-07-17 NOTE — Progress Notes (Signed)
[name], [name] ([id number]) Visit Report for 07/17/2021 Arrival Information Details Patient Name: Date of Service: [name], [name] 07/17/2021 11:15 A M Medical Record Number: [id number] Patient Account Number: [id number] Date of Birth/Sex: Treating RN: 15-Mar-1951 (71 y.o. [name], [name] Primary Care Arleny Kruger: [name] Other Clinician: Referring Cariann Kinnamon: Treating Homer Pfeifer/Extender: Cornelious Bryant in Treatment: 38 Visit Information History Since Last Visit Added or deleted any medications: No Patient Arrived: Ambulatory Any new allergies or adverse reactions: No Arrival Time: 11:21 Had a fall or experienced change in No Accompanied By: self activities of daily living that may affect Transfer Assistance: None risk of falls: Patient Identification Verified: Yes Signs or symptoms of abuse/neglect since last visito No Secondary Verification Process Completed: Yes Hospitalized since last visit: No Patient Requires Transmission-Based Precautions: No Implantable device outside of the clinic excluding No Patient Has Alerts: Yes cellular tissue based products placed in the center Patient Alerts: ABI's: 09/22 L:1.09 R: 1. since last visit: TBI's: R:0.72 L:0.99 Has Dressing in Place as Prescribed: Yes Has Compression in Place as Prescribed: Yes Pain Present Now: Yes Electronic Signature(s) Signed: 07/17/2021 4:39:43 PM By: Baruch Gouty RN, BSN Entered By: Baruch Gouty on 07/17/2021 11:22:40 -------------------------------------------------------------------------------- Compression Therapy Details Patient Name: Date of Service: [name]. 07/17/2021 11:15 A M Medical Record Number: [id number] Patient Account Number: [id number] Date of Birth/Sex: Treating RN: 1950/11/08 (71 y.o. [name] Primary Care Faye Sanfilippo: [name] Other Clinician: Referring Nazar Kuan: Treating Cortina Vultaggio/Extender: Cornelious Bryant in Treatment:  13 Compression Therapy Performed for Wound Assessment: Wound #19 Left,Anterior Lower Leg Performed By: Clinician Baruch Gouty, RN Compression Type: Double Layer Electronic Signature(s) Signed: 07/17/2021 4:39:43 PM By: Baruch Gouty RN, BSN Entered By: Baruch Gouty on 07/17/2021 11:49:32 -------------------------------------------------------------------------------- Encounter Discharge Information Details Patient Name: Date of Service: [name]. 07/17/2021 11:15 A M Medical Record Number: [id number] Patient Account Number: [id number] Date of Birth/Sex: Treating RN: 1951-05-22 (71 y.o. [name] Primary Care Marylon Verno: [name] Other Clinician: Referring Vyolet Sakuma: Treating Ernestina Joe/Extender: Cornelious Bryant in Treatment: 13 Encounter Discharge Information Items Discharge Condition: Stable Ambulatory Status: Ambulatory Discharge Destination: Home Transportation: Private Auto Accompanied By: self Schedule Follow-up Appointment: Yes Clinical Summary of Care: Patient Declined Electronic Signature(s) Signed: 07/17/2021 4:39:43 PM By: Baruch Gouty RN, BSN Entered By: Baruch Gouty on 07/17/2021 11:51:10 -------------------------------------------------------------------------------- Pain Assessment Details Patient Name: Date of Service: [name]. 07/17/2021 11:15 A M Medical Record Number: [id number] Patient Account Number: [id number] Date of Birth/Sex: Treating RN: 1951/04/25 (71 y.o. [name] Primary Care Natia Fahmy: [name] Other Clinician: Referring Francetta Ilg: Treating Gala Padovano/Extender: Cornelious Bryant in Treatment: 13 Active Problems Location of Pain Severity and Description of Pain Patient Has Paino Yes Site Locations Pain Location: Pain in Ulcers With Dressing Change: No Rate the pain. Current Pain Level: 7 Worst Pain Level: 8 Least Pain Level: 4 Character of Pain Describe  the Pain: Aching Pain Management and Medication Current Pain Management: Medication: Yes Other: reposition Is the Current Pain Management Adequate: Adequate How does your wound impact your activities of daily livingo Sleep: Yes Bathing: No Appetite: No Relationship With Others: No Bladder Continence: No Emotions: Yes Bowel Continence: No Drive: No Toileting: No Hobbies: No Dressing: No Electronic Signature(s) Signed: 07/17/2021 4:39:43 PM By: Baruch Gouty RN, BSN Entered By: Baruch Gouty on 07/17/2021 11:24:38 -------------------------------------------------------------------------------- Patient/Caregiver Education Details Patient Name: Date of Service: [name] 1/23/2023andnbsp11:15 A M Medical Record Number:  [id number] Patient Account Number: [id number] Date of Birth/Gender: Treating RN: August 15, 1950 (71 y.o. [name] Primary Care Physician: [name] Other Clinician: Referring Physician: Treating Physician/Extender: Cornelious Bryant in Treatment: 13 Education Assessment Education Provided To: Patient Education Topics Provided Venous: Methods: Explain/Verbal Responses: Reinforcements needed, State content correctly [employer]) Signed: 07/17/2021 4:39:43 PM By: Baruch Gouty RN, BSN Entered By: Baruch Gouty on 07/17/2021 11:50:54 -------------------------------------------------------------------------------- Wound Assessment Details Patient Name: Date of Service: [name]. 07/17/2021 11:15 A M Medical Record Number: [id number] Patient Account Number: [id number] Date of Birth/Sex: Treating RN: 1951/02/15 (71 y.o. [name] Primary Care Holleigh Crihfield: [name] Other Clinician: Referring Shellby Schlink: Treating Dagmar Adcox/Extender: Cornelious Bryant in Treatment: 13 Wound Status Wound Number: 18 Primary Etiology: Diabetic Wound/Ulcer of the Lower Extremity Wound Location:  Left, Plantar Metatarsal head first Wound Status: Open Wounding Event: Gradually Appeared Date Acquired: 08/23/2020 Weeks Of Treatment: 13 Clustered Wound: No Wound Measurements Length: (cm) 1.7 Width: (cm) 2.4 Depth: (cm) 0.1 Area: (cm) 3.204 Volume: (cm) 0.32 % Reduction in Area: 77.3% % Reduction in Volume: 88.7% Wound Description Classification: Grade 2 Exudate Amount: Medium Exudate Type: Serosanguineous Exudate Color: red, brown Treatment Notes Wound #18 (Metatarsal head first) Wound Laterality: Plantar, Left Cleanser Soap and Water Discharge Instruction: May shower and wash wound with dial antibacterial soap and water prior to dressing change. Wound Cleanser Discharge Instruction: Cleanse the wound with wound cleanser prior to applying a clean dressing using gauze sponges, not tissue or cotton balls. Peri-Wound Care Triamcinolone 15 (g) Discharge Instruction: Use triamcinolone 15 (g) as directed Zinc Oxide Ointment 30g tube Discharge Instruction: Apply Zinc Oxide to periwound with each dressing change Sween Lotion (Moisturizing lotion) Discharge Instruction: Apply moisturizing lotion as directed Topical Primary Dressing Hydrofera Blue Classic Foam, 4x4 in Discharge Instruction: Moisten with saline prior to applying to wound bed Secondary Dressing ABD Pad, 5x9 Discharge Instruction: Apply over primary dressing as directed. Secured With Compression Wrap CoFlex TLC XL 2-layer Compression System 4x7 (in/yd) Discharge Instruction: Apply CoFlex 2-layer compression as directed. (alt for 4 layer) Compression Stockings Add-Ons Electronic Signature(s) Signed: 07/17/2021 4:39:43 PM By: Baruch Gouty RN, BSN Entered By: Baruch Gouty on 07/17/2021 11:48:40 -------------------------------------------------------------------------------- Wound Assessment Details Patient Name: Date of Service: [name]. 07/17/2021 11:15 A M Medical Record Number:  [id number] Patient Account Number: [id number] Date of Birth/Sex: Treating RN: 08/07/1950 (71 y.o. [name] Primary Care Keandra Medero: [name] Other Clinician: Referring Charron Coultas: Treating Lenus Trauger/Extender: Cornelious Bryant in Treatment: 13 Wound Status Wound Number: 19 Primary Etiology: Diabetic Wound/Ulcer of the Lower Extremity Wound Location: Left, Anterior Lower Leg Wound Status: Open Wounding Event: Gradually Appeared Date Acquired: 08/23/2020 Weeks Of Treatment: 13 Clustered Wound: No Wound Measurements Length: (cm) 0.2 Width: (cm) 0.3 Depth: (cm) 0.2 Area: (cm) 0.047 Volume: (cm) 0.009 % Reduction in Area: 99.7% % Reduction in Volume: 99.8% Wound Description Classification: Grade 2 Exudate Amount: Medium Exudate Type: Serosanguineous Exudate Color: red, brown Treatment Notes Wound #19 (Lower Leg) Wound Laterality: Left, Anterior Cleanser Soap and Water Discharge Instruction: May shower and wash wound with dial antibacterial soap and water prior to dressing change. Wound Cleanser Discharge Instruction: Cleanse the wound with wound cleanser prior to applying a clean dressing using gauze sponges, not tissue or cotton balls. Peri-Wound Care Triamcinolone 15 (g) Discharge Instruction: Use triamcinolone 15 (g) as directed Zinc Oxide Ointment 30g tube Discharge Instruction: Apply Zinc Oxide to periwound with each dressing change Sween Lotion (  Moisturizing lotion) Discharge Instruction: Apply moisturizing lotion as directed Topical Primary Dressing Hydrofera Blue Classic Foam, 4x4 in Discharge Instruction: Moisten with saline prior to applying to wound bed Secondary Dressing Secured With Compression Wrap CoFlex TLC XL 2-layer Compression System 4x7 (in/yd) Discharge Instruction: Apply CoFlex 2-layer compression as directed. (alt for 4 layer) Compression Stockings Add-Ons Electronic Signature(s) Signed: 07/17/2021 4:39:43 PM By:  Baruch Gouty RN, BSN Entered By: Baruch Gouty on 07/17/2021 11:48:40 -------------------------------------------------------------------------------- Wound Assessment Details Patient Name: Date of Service: [name]. 07/17/2021 11:15 A M Medical Record Number: [id number] Patient Account Number: [id number] Date of Birth/Sex: Treating RN: [birth year] (71 y.o. [name] Primary Care Sibyl Mikula: [name] Other Clinician: Referring Ivon Roedel: Treating Aniella Wandrey/Extender: Cornelious Bryant in Treatment: 13 Wound Status Wound Number: 22 Primary Etiology: Cyst Wound Location: Left, Lateral Lower Leg Wound Status: Open Wounding Event: Bump Date Acquired: 06/03/2021 Weeks Of Treatment: 6 Clustered Wound: No Wound Measurements Length: (cm) 3.4 Width: (cm) 2.4 Depth: (cm) 1 Area: (cm) 6.409 Volume: (cm) 6.409 % Reduction in Area: -288.7% % Reduction in Volume: -385.9% Wound Description Classification: Full Thickness With Exposed Support Structure Exudate Amount: Large Exudate Type: Purulent Exudate Color: yellow, brown, green s Treatment Notes Wound #22 (Lower Leg) Wound Laterality: Left, Lateral Cleanser Soap and Water Discharge Instruction: May shower and wash wound with dial antibacterial soap and water prior to dressing change. Wound Cleanser Discharge Instruction: Cleanse the wound with wound cleanser prior to applying a clean dressing using gauze sponges, not tissue or cotton balls. Peri-Wound Care Triamcinolone 15 (g) Discharge Instruction: Use triamcinolone 15 (g) as directed Zinc Oxide Ointment 30g tube Discharge Instruction: Apply Zinc Oxide to periwound with each dressing change Sween Lotion (Moisturizing lotion) Discharge Instruction: Apply moisturizing lotion as directed Topical Primary Dressing KerraCel Ag Gelling Fiber Dressing, 4x5 in (silver alginate) Discharge Instruction: Apply silver alginate to wound bed as  instructed Secondary Dressing ABD Pad, 5x9 Discharge Instruction: Apply over primary dressing as directed. Secured With Compression Wrap CoFlex TLC XL 2-layer Compression System 4x7 (in/yd) Discharge Instruction: Apply CoFlex 2-layer compression as directed. (alt for 4 layer) Compression Stockings Add-Ons Electronic Signature(s) Signed: 07/17/2021 4:39:43 PM By: Baruch Gouty RN, BSN Entered By: Baruch Gouty on 07/17/2021 11:48:40 -------------------------------------------------------------------------------- Vitals Details Patient Name: Date of Service: [name]. 07/17/2021 11:15 A M Medical Record Number: [id number] Patient Account Number: [id number] Date of Birth/Sex: Treating RN: [birth year] (71 y.o. [name] Primary Care Jori Frerichs: [name] Other Clinician: Referring Lenae Wherley: Treating Jamez Ambrocio/Extender: Cornelious Bryant in Treatment: 13 Vital Signs Time Taken: 11:24 Temperature (F): 98.3 Height (in): 74 Pulse (bpm): 64 Weight (lbs): 238 Respiratory Rate (breaths/min): 18 Body Mass Index (BMI): 30.6 Blood Pressure (mmHg): 151/79 Capillary Blood Glucose (mg/dl): 246 Reference Range: 80 - 120 mg / dl Notes glucose at present per pt's meter Electronic Signature(s) Signed: 07/17/2021 4:39:43 PM By: Baruch Gouty RN, BSN Entered By: Baruch Gouty on 07/17/2021 11:26:56

## 2021-07-18 ENCOUNTER — Ambulatory Visit (INDEPENDENT_AMBULATORY_CARE_PROVIDER_SITE_OTHER): Payer: Medicare Other | Admitting: Nurse Practitioner

## 2021-07-18 ENCOUNTER — Encounter: Payer: Self-pay | Admitting: Nurse Practitioner

## 2021-07-18 VITALS — BP 152/71 | HR 67 | Ht 73.5 in | Wt 264.6 lb

## 2021-07-18 DIAGNOSIS — E782 Mixed hyperlipidemia: Secondary | ICD-10-CM | POA: Diagnosis not present

## 2021-07-18 DIAGNOSIS — E1165 Type 2 diabetes mellitus with hyperglycemia: Secondary | ICD-10-CM

## 2021-07-18 DIAGNOSIS — Z794 Long term (current) use of insulin: Secondary | ICD-10-CM

## 2021-07-18 DIAGNOSIS — I10 Essential (primary) hypertension: Secondary | ICD-10-CM | POA: Diagnosis not present

## 2021-07-18 LAB — AEROBIC/ANAEROBIC CULTURE W GRAM STAIN (SURGICAL/DEEP WOUND)
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
## 2021-07-18 NOTE — Patient Instructions (Signed)
Diabetes Mellitus and Nutrition, Adult ?When you have diabetes, or diabetes mellitus, it is very important to have healthy eating habits because your blood sugar (glucose) levels are greatly affected by what you eat and drink. Eating healthy foods in the right amounts, at about the same times every day, can help you: ?Manage your blood glucose. ?Lower your risk of heart disease. ?Improve your blood pressure. ?Reach or maintain a healthy weight. ?What can affect my meal plan? ?Every person with diabetes is different, and each person has different needs for a meal plan. Your health care provider may recommend that you work with a dietitian to make a meal plan that is best for you. Your meal plan may vary depending on factors such as: ?The calories you need. ?The medicines you take. ?Your weight. ?Your blood glucose, blood pressure, and cholesterol levels. ?Your activity level. ?Other health conditions you have, such as heart or kidney disease. ?How do carbohydrates affect me? ?Carbohydrates, also called carbs, affect your blood glucose level more than any other type of food. Eating carbs raises the amount of glucose in your blood. ?It is important to know how many carbs you can safely have in each meal. This is different for every person. Your dietitian can help you calculate how many carbs you should have at each meal and for each snack. ?How does alcohol affect me? ?Alcohol can cause a decrease in blood glucose (hypoglycemia), especially if you use insulin or take certain diabetes medicines by mouth. Hypoglycemia can be a life-threatening condition. Symptoms of hypoglycemia, such as sleepiness, dizziness, and confusion, are similar to symptoms of having too much alcohol. ?Do not drink alcohol if: ?Your health care provider tells you not to drink. ?You are pregnant, may be pregnant, or are planning to become pregnant. ?If you drink alcohol: ?Limit how much you have to: ?0-1 drink a day for women. ?0-2 drinks a day  for men. ?Know how much alcohol is in your drink. In the [place] one drink equals one 12 oz bottle of beer (355 mL), one 5 oz glass of wine (148 mL), or one 1? oz glass of hard liquor (44 mL). ?Keep yourself hydrated with water, diet soda, or unsweetened iced tea. Keep in mind that regular soda, juice, and other mixers may contain a lot of sugar and must be counted as carbs. ?What are tips for following this plan? ?Reading food labels ?Start by checking the serving size on the Nutrition Facts label of packaged foods and drinks. The number of calories and the amount of carbs, fats, and other nutrients listed on the label are based on one serving of the item. Many items contain more than one serving per package. ?Check the total grams (g) of carbs in one serving. ?Check the number of grams of saturated fats and trans fats in one serving. Choose foods that have a low amount or none of these fats. ?Check the number of milligrams (mg) of salt (sodium) in one serving. Most people should limit total sodium intake to less than 2,300 mg per day. ?Always check the nutrition information of foods labeled as "low-fat" or "nonfat." These foods may be higher in added sugar or refined carbs and should be avoided. ?Talk to your dietitian to identify your daily goals for nutrients listed on the label. ?Shopping ?Avoid buying canned, pre-made, or processed foods. These foods tend to be high in fat, sodium, and added sugar. ?Shop around the outside edge of the grocery store. This is where you will   most often find fresh fruits and vegetables, bulk grains, fresh meats, and fresh dairy products. ?Cooking ?Use low-heat cooking methods, such as baking, instead of high-heat cooking methods, such as deep frying. ?Cook using healthy oils, such as olive, canola, or sunflower oil. ?Avoid cooking with butter, cream, or high-fat meats. ?Meal planning ?Eat meals and snacks regularly, preferably at the same times every day. Avoid going long periods of  time without eating. ?Eat foods that are high in fiber, such as fresh fruits, vegetables, beans, and whole grains. ?Eat 4-6 oz (112-168 g) of lean protein each day, such as lean meat, chicken, fish, eggs, or tofu. One ounce (oz) (28 g) of lean protein is equal to: ?1 oz (28 g) of meat, chicken, or fish. ?1 egg. ?? cup (62 g) of tofu. ?Eat some foods each day that contain healthy fats, such as avocado, nuts, seeds, and fish. ?What foods should I eat? ?Fruits ?Berries. Apples. Oranges. Peaches. Apricots. Plums. Grapes. Mangoes. Papayas. Pomegranates. Kiwi. Cherries. ?Vegetables ?Leafy greens, including lettuce, spinach, kale, chard, collard greens, mustard greens, and cabbage. Beets. Cauliflower. Broccoli. Carrots. Green beans. Tomatoes. Peppers. Onions. Cucumbers. Brussels sprouts. ?Grains ?Whole grains, such as whole-wheat or whole-grain bread, crackers, tortillas, cereal, and pasta. Unsweetened oatmeal. Quinoa. Brown or wild rice. ?Meats and other proteins ?Seafood. Poultry without skin. Lean cuts of poultry and beef. Tofu. Nuts. Seeds. ?Dairy ?Low-fat or fat-free dairy products such as milk, yogurt, and cheese. ?The items listed above may not be a complete list of foods and beverages you can eat and drink. Contact a dietitian for more information. ?What foods should I avoid? ?Fruits ?Fruits canned with syrup. ?Vegetables ?Canned vegetables. Frozen vegetables with butter or cream sauce. ?Grains ?Refined white flour and flour products such as bread, pasta, snack foods, and cereals. Avoid all processed foods. ?Meats and other proteins ?Fatty cuts of meat. Poultry with skin. Breaded or fried meats. Processed meat. Avoid saturated fats. ?Dairy ?Full-fat yogurt, cheese, or milk. ?Beverages ?Sweetened drinks, such as soda or iced tea. ?The items listed above may not be a complete list of foods and beverages you should avoid. Contact a dietitian for more information. ?Questions to ask a health care provider ?Do I need to  meet with a certified diabetes care and education specialist? ?Do I need to meet with a dietitian? ?What number can I call if I have questions? ?When are the best times to check my blood glucose? ?Where to find more information: ?American Diabetes Association: diabetes.org ?Academy of Nutrition and Dietetics: eatright.org ?National Institute of Diabetes and Digestive and Kidney Diseases: niddk.nih.gov ?Association of Diabetes Care & Education Specialists: diabeteseducator.org ?Summary ?It is important to have healthy eating habits because your blood sugar (glucose) levels are greatly affected by what you eat and drink. It is important to use alcohol carefully. ?A healthy meal plan will help you manage your blood glucose and lower your risk of heart disease. ?Your health care provider may recommend that you work with a dietitian to make a meal plan that is best for you. ?This information is not intended to replace advice given to you by your health care provider. Make sure you discuss any questions you have with your health care provider. ?Document Revised: 01/13/2020 Document Reviewed: 01/13/2020 ?Elsevier Patient Education ? 2022 [employer] ? ?

## 2021-07-19 ENCOUNTER — Encounter: Payer: Self-pay | Admitting: Vascular Surgery

## 2021-07-19 ENCOUNTER — Ambulatory Visit (INDEPENDENT_AMBULATORY_CARE_PROVIDER_SITE_OTHER): Payer: BC Managed Care – PPO | Admitting: Vascular Surgery

## 2021-07-19 ENCOUNTER — Encounter: Payer: Self-pay | Admitting: Nurse Practitioner

## 2021-07-19 ENCOUNTER — Other Ambulatory Visit: Payer: Self-pay

## 2021-07-19 VITALS — BP 125/80 | HR 63 | Temp 97.7°F | Resp 20 | Ht 73.5 in | Wt 265.0 lb

## 2021-07-19 DIAGNOSIS — I779 Disorder of arteries and arterioles, unspecified: Secondary | ICD-10-CM

## 2021-07-19 DIAGNOSIS — Z9889 Other specified postprocedural states: Secondary | ICD-10-CM

## 2021-07-19 NOTE — Progress Notes (Signed)
°  ° °  Subjective:     Patient ID: [name], male   DOB: Oct 05, 1950, 71 y.o.   MRN: [id number]  HPI 71 year old male status post I&D left lower extremity fluid collection on 06/06/2021.  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  He is now followed in the wound care center he goes twice a week.  He has been off antibiotics since his last follow-up here which was 2 weeks ago today denies any current fevers.  He has had minimal drainage from his wounds.  Does think that his left leg has been swelling more below the knee.     Review of Systems Left leg swelling    Objective:   Physical Exam Vitals:   07/19/21 1115  BP: 125/80  Pulse: 63  Resp: 20  Temp: 97.7 F (36.5 C)  SpO2: 93%   Awake alert oriented Nonlabored respirations Interviewed applied to the left leg There is minimal seropurulent drainage from both wounds where the Penrose drain is placed, the Penrose drain is removed today and dry dressings were placed.    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  Penrose drain removed today.    Plan:     Continue follow-up with wound care center, okay for wound VAC  Follow-up here in 3 months with repeat arterial duplex. If there are wound issues sooner we can certainly see him for evaluation    Yanira Tolsma C. Donzetta Matters, MD [facility] Office: [phone] Pager: [phone]

## 2021-07-20 ENCOUNTER — Encounter (HOSPITAL_BASED_OUTPATIENT_CLINIC_OR_DEPARTMENT_OTHER): Payer: Medicare Other | Admitting: Internal Medicine

## 2021-07-20 DIAGNOSIS — E11621 Type 2 diabetes mellitus with foot ulcer: Secondary | ICD-10-CM | POA: Diagnosis not present

## 2021-07-20 NOTE — Progress Notes (Signed)
ROBI, DEWOLFE ([id number]) Visit Report for 07/20/2021 HPI Details Patient Name: Date of Service: [name], [name] 07/20/2021 2:15 PM Medical Record Number: [id number] Patient Account Number: [id number] Date of Birth/Sex: Treating RN: [birth year] (71 y.o. Male) Deon Pilling Primary Care Provider: [name] Other Clinician: Referring Provider: Treating Provider/Extender: Stormy Card in Treatment: 14 History of Present Illness HPI Description: 10/11/17; [name] is a 71 year old man who tells me that in 2015 he slipped down the latter traumatizing his left leg. He developed a wound in the same spot the area that we are currently looking at. He states this closed over for the most part although he always felt it was somewhat unstable. In 2016 he hit the same area with the door of his car had this reopened. He tells me that this is never really closed although sometimes an inflow it remains open on a constant basis. He has not been using any specific dressing to this except for topical antibiotics the nature of which were not really sure. His primary doctor did send him to see Dr. [name] of interventional cardiology. He underwent an angiogram on 08/06/17 and he underwent a PTA and directional atherectomy of the lesser distal SFA and popliteal arteries which resulted in brisk improvement in blood flow. It was noted that he had 2 vessel runoff through the anterior tibial and peroneal. He is also been to see vascular and interventional radiologist. He was not felt to have any significant superficial venous insufficiency. Presumably is not a candidate for any ablation. It was suggested he come here for wound care. The patient is a type II diabetic on insulin. He also has a history of venous insufficiency. ABIs on the left were noncompressible in our clinic 10/21/17; patient we admitted to the clinic last week. 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 We put Iodosorb on him after an aggressive debridement and 3 layer compression. He complained of pain in his ankle and itching with is skin in fact he scratched the area on the medial calf superiorly at the rim of our wraps and he has 2 small open areas in that location today which are new. I changed his primary dressing today to silver collagen. As noted he is already had revascularization and does not have any significant superficial venous insufficiency that would be amenable to ablation 10/28/17; patient admitted to the clinic 2 weeks ago. He has a smaller Wound. Scratch injury from last week revealed. There is large wound over the tibial area. This is smaller. Granulation looks healthy. No need for debridement. 11/04/17; the wound on the left lateral calf looks better. Improved dimensions. Surface of this looks better. We've been maintaining him and Kerlix Coban wraps. He finds this much more comfortable. Silver collagen dressing 11/11/17; left lateral Wound continues to look healthy be making progress. Using a #5 curet I removed removed nonviable skin from the surface of the wound and then necrotic debris from the wound surface. Surface of the wound continues to look healthy. He also has an open area on the left great toenail bed. We've been using topical antibiotics. 11/19/17; left anterior lateral wound continues to look healthy but it's not closed. He also had a small wound above this on the left leg Initially traumatic wounds in the setting of significant chronic venous insufficiency and stasis dermatitis 11/25/17; left anterior wounds superiorly is closed still a small wound inferiorly. 12/02/17;  left anterior tibial area. Arrives today with adherent callus. Post debridement clearly not completely closed. Hydrofera Blue under 3 layer compression. 12/09/17; left anterior tibia. Circumferential eschar however the wound bed looks stable to improved. We've been  using Hydrofera Blue under 3 layer compression 12/17/17; left anterior tibia. Apparently this was felt to be closed however when the wrap was taken off there is a skin tear to reopen wounds in the same area we've been using Hydrofera Blue under 3 layer compression 12/23/17 left anterior tibia. Not close to close this week apparently the [facility] was stuck to this again. Still circumferential eschar requiring debridement. I put a contact layer on this this time under the Hydrofera Blue 12/31/17; left anterior tibia. Wound is better slight amount of hyper-granulation. Using Hydrofera Blue over Adaptic. 01/07/18; left anterior tibia. The wound had some surface eschar however after this was removed he has no open wound.he was already revascularized by Dr. [name] when he came to our clinic with atherectomy of the left SFA and popliteal artery. He was also sent to interventional radiology for venous reflux studies. He was not felt to have significant reflux but certainly has chronic venous changes of his skin with hemosiderin deposition around this area. He will definitely need to lubricate his skin and wear compression stocking and I've talked to him about this. READMISSION 05/26/2018 This is a now 71 year old man we cared for with traumatic wounds on his left anterior lower extremity. He had been previously revascularized during that admission by Dr. [name]. Apparently in follow-up Dr. [name] noted that he had deterioration in his arterial status. He underwent a stent placement in the distal left SFA on 04/22/2018. 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 He went back to the angiography suite on 04/30/2018 he underwent PTA and balloon angioplasty of the occluded left mid anterior tibial artery, thrombotic occlusion went from 100 to 0% which reconstitutes the posterior tibial artery. He had thrombectomy and aspiration of the peroneal artery. The stent placed in the distal SFA left SFA was  still occluded. 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 He has been recuperating at [place] home in [facility] after the most recent hospitalization. He is going home tomorrow. He tells me that 2 weeks ago he traumatized the tip of his left fifth toe. He came in urgently for our review of this. This was a history of before I noted that Dr. [name] had already noted dry gangrenous changes of the left fifth toe 06/09/2018; 2-week follow-up. I did contact Dr. [name] after his last appointment and he apparently saw 1 of Dr. Irven Shelling colleagues the next day. He does not follow-up with Dr. [name] himself until Thursday of this week. 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 Nevertheless there is no evidence of infection no drainage and no pain. He had a new area that this week when we were signing him in today on the left anterior mid tibia area, this is in close proximity to the previous wound we have dealt with in this clinic. 06/23/2018; 2-week follow-up. I did not receive a recent note from Dr. [name] to review today. Our office is trying to obtain this. He is apparently  not planning to do further vascular interventions and wondered about compression to try and help with the patient's chronic venous insufficiency. However we are also concerned about the arterial flow. He arrives in clinic today with a new area on the left third toe. The areas on the calf/anterior tibia are close to closing. The left fifth toe is still mummified using Betadine. -In reviewing things with the patient he has what sounds like claudication with mild to moderate amount of activity. 06/27/2018; x-ray of his foot suggested osteomyelitis of the left third  toe. I prescribed Levaquin over the phone while we attempted to arrange a plan of care. However the patient called yesterday to report he had low-grade fever and he came in today acutely. There is been a marked deterioration in the left third toe with spreading cellulitis up into the dorsal left foot. He was referred to the emergency room. Readmission: 06/29/2020 patient presents today for reevaluation here in our clinic he was previously treated by Dr. Dellia Nims at the latter part of 2019 in 2 the beginning of 2020. Subsequently we have not seen him since that time in the interim he did have evaluation with vein and vascular specialist specifically Dr. Anice Paganini who did perform quite extensive work for a left femoral to anterior tibial artery bypass. With that being said in the interim the patient has developed significant lymphedema and has wounds that he tells me have really never healed in regard to the incision site on the left leg. He also has multiple wounds on the feet for various reasons some of which is that he tends to pick at his feet. Fortunately there is no signs of active infection systemically at this time he does have some wounds that are little bit deeper but most are fairly superficial he seems to have good blood flow and overall everything appears to be healthy I see no bone exposed and no obvious signs of osteomyelitis. I do not know that he necessarily needs a x-ray at this point although that something we could consider depending on how things progress. The patient does have a history of lymphedema, diabetes, this is type II, chronic kidney disease stage III, hypertension, and history of peripheral vascular disease. 07/05/2020; patient admitted last week. Is a patient I remember from 2019 he had a spreading infection involving the left foot and we sent him to the hospital. 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 He subsequently had a left  femoral to anterior tibial bypass by Dr.Cain vein and vascular. He also has severe lymphedema with chronic skin changes related to that on the left leg. The most problematic area that was new today was on the left medial great toe. This was apparently a small area last week there was purulent drainage which our intake nurse cultured. Also areas on the left medial foot and heel left lateral foot. He has 2 areas on the left medial calf left lateral calf in the setting of the severe lymphedema. 07/13/2020 on evaluation today patient appears to be doing better in my opinion compared to his last visit. The good news is there is no signs of active infection systemically and locally I do not see any signs of infection either. He did have an x-ray which was negative that is great news he had a culture which showed MRSA but at the same time he is been on the doxycycline which has helped. I do think we may  want to extend this for 7 additional days 1/25; patient admitted to the clinic a few weeks ago. 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 We have been putting him under compression his edema control is a lot better but he is severe verricused skin on the left leg. He is really done quite well he still has an open area on the left medial calf and the left medial first metatarsal head. We have been using silver collagen on the leg silver alginate on the foot 07/27/2020 upon evaluation today patient appears to be doing decently well in regard to his wounds. He still has a lot of dry skin on the left leg. Some of this is starting to peel back and I think he may be able to have them out by removing some that today. Fortunately there is no signs of active infection at this time on the left leg although on the right leg he does appear to have swelling and erythema as well as some mild warmth to touch. This does have been concerned about the possibility of cellulitis although  within the differential diagnosis I do think that potentially a DVT has to be at least considered. We need to rule that out before proceeding would just call in the cellulitis. Especially since he is having pain in the posterior aspect of his calf muscle. 2/8; the patient had seen sparingly. He has severe skin changes of chronic lymphedema in the left leg thickened hyperkeratotic verrucous skin. He has an open wound on the medial part of the left first met head left mid tibia. He also has a rim of nonepithelialized skin in the anterior mid tibia. He brought in the AmLactin lotion that was been prescribed although I am not sure under compression and its utility. There concern about cellulitis on the right lower leg the last time he was here. He was put on on antibiotics. His DVT rule out was negative. The right leg looks fine he is using his stocking on this area 08/10/2020 upon evaluation today patient appears to be doing well with regard to his leg currently. He has been tolerating the dressing changes without complication. Fortunately there is no signs of active infection which is great news. Overall very pleased with where things stand. 2/22; the patient still has an area on the medial part of the left first met his head. This looks better than when I last saw this earlier this month he has a rim of epithelialization but still some surface debris. Mostly everything on the left leg is healed. There is still a vulnerable in the left mid tibia area. 08/30/2020 upon evaluation today patient appears to be doing much better in regard to his wounds on his foot. 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 Nonetheless I think we will address that today I Minna send in a prescription for him in that regard. Overall though there does not appear to be any signs of significant  worsening. 09/07/2020 on evaluation today patient's wounds over his left foot appear to be doing excellent. I do not see any signs of infection there is some callus buildup this can require debridement for certain but overall I feel like he is managing quite nicely. He still using the AmLactin cream which has been beneficial for him as well. 3/22; left foot  wound is closed. There is no open area here. He is using ammonium lactate lotion to the lower extremities to help exfoliate dry cracked skin. He has compression stockings from elastic therapy in [place]. The wound on the medial part of his left first met head is healed today. READMISSION 04/12/2021 [name] is a patient we know fairly well he had a prolonged stay in clinic in 2019 with wounds on his left lateral and left anterior lower extremity in the setting of chronic venous insufficiency. More recently he was here earlier this year with predominantly an area on his left foot first metatarsal head plantar and he says the plantar foot broke down on its not long after we discharged him but he did not come back here. The last few months areas of broken down on his left anterior and again the left lateral lower extremity. The leg itself is very swollen chronically enlarged a lot of hyperkeratotic dry Berry Q skin in the left lower leg. His edema extends well into the thigh. He was seen by Dr. Donzetta Matters. He had ABIs on 03/02/2021 showing an ABI on the right of 1 with a TBI of 0.72 his ABI in the left at 1.09 TBI of 0.99. Monophasic and biphasic waveforms on the right. On the left monophasic waveforms were noted he went on to have an angiogram on 03/27/2021 this showed the aortic aortic and iliac segments were free of flow-limiting stenosis the left common femoral vein to evaluate the left femoral to anterior tibial artery bypass was unobstructed the bypass was patent without any areas of stenosis. We discharged the patient in bilateral juxta lite  stockings but very clearly that was not sufficient to control the swelling and maintain skin integrity. He is clearly going to need compression pumps. 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 This is fortunate because he is on his feet for long periods of time. 10/27; patient comes in with our intake nurse reporting copious amount of green drainage from the left anterior mid tibia the left dorsal foot and to a lesser extent the left medial mid tibia. We left the compression wrap on all week for the amount of edema in his left leg is quite a bit better. We use silver alginate as the primary dressing 11/3; edema control is good. Left anterior lower leg left medial lower leg and the plantar first metatarsal head. The left anterior lower leg required debridement. Deep tissue culture I did of this wound showed MRSA I put him on 10 days of doxycycline which she will start today. We have him in compression wraps. He has a security card and [employer] however he is retiring on November 15. We will need to then get him into a better offloading boot for the left foot perhaps a total contact cast 11/10; edema control is quite good. Left anterior and left medial lower leg wounds in the setting of chronic venous insufficiency and lymphedema. He also has a substantial area over the left plantar first metatarsal head. 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 He has significant hypergranulation on the left plantar foot wound. The patient is a diabetic but he does not have significant PAD 11/17; edema control is quite good. Left anterior and left medial lower leg wounds look better. The really concerning area remains the area  on the left plantar first metatarsal head. He has a rim of epithelialization. He has been using a surgical shoe The patient is now retired from a a  [employer] I have gone over with him the need to offload this area aggressively. Starting today with a forefoot off loader but . possibly a total contact cast. 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 Arrives with a very significant odor from I think all of the wounds on the left leg and the left foot. Because of this I did not put a total contact cast on him today but will could still consider this. His wife was having cataract surgery which is the reason he missed the appointment 12/6. I saw this man 5 days ago with a swelling below the popliteal fossa. I thought he actually might have a Baker's cyst however the DVT rule out study that we could arrange right away was negative the technician told me this was not a ruptured Baker's cyst. We attempted to get this aspirated by under ultrasound guidance in interventional radiology however all they did was an ultrasound however it shows an extensive fluid collection 62 x 8 x 9.4 in the left thigh and left calf. The patient states he thinks this started 8 days ago or so but he really is not complaining of any pain, fever or systemic symptoms. He has not ha 12/20; after some difficulty I managed to get the patient into see Dr. Donzetta Matters. Eventually he was taken into the hospital and had a drain put in the fluid collection below his left knee posteriorly extending into the posterior thigh. He still has the drain in place. Culture of this showed moderate staff aureus few Morganella and few Klebsiella he is now on doxycycline and ciprofloxacin as suggested by infectious disease he is on this for a month. The drain will remain in place until it stops draining 12/29; he comes in today with the 1 wound on his left leg and the area on the left plantar first met head significantly smaller. Both look healthy. He still has the drain in the left leg. He says  he has to change this daily. Follows up with Dr. Donzetta Matters on January 11. 06/29/2021; the wounds that I am following on the left leg and left first met head continued to be quite healthy. However the area where his inferior drain is in place had copious amounts of drainage which was green in color. The wound here is larger. Follows up with Dr. Gwenlyn Saran of vein and vascular his surgeon next week as well as infectious disease. He remains on ciprofloxacin and doxycycline. He is not complaining of excessive pain in either one of the drain areas 1/12; the patient saw vascular surgery and infectious disease. Vascular surgery has left the drain in place as there was still some notable drainage still see him back in 2 weeks. Dr. Velna Ochs stop the doxycycline and ciprofloxacin and I do not believe he follows up with them at this point. Culture I did last week showed both doxycycline resistant MRSA and Pseudomonas not sensitive to ciprofloxacin although only in rare titers 1/19; the patient's wound on the left anterior lower leg is just about healed. We have continued healing of the area that was medially on the left leg. Left first plantar metatarsal head continues to get smaller. The major problem here  is his 2 drain sites 1 on the left upper calf and lateral thigh. There is purulent drainage still from the left lateral thigh. I gave him antibiotics last week but we still have recultured. He has the drain in the area I think this is eventually going to have to come out. I suspect there will be a connecting wound to heal here perhaps with improved VAc 1/26; the patient had his drain removed by vein and vascular on 1/25/. This was a large pocket of fluid in his left thigh that seem to tunnel into his left upper calf. 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 His mention his Penrose drain was removed today. He now has a tunneling wound on his left calf and left thigh. Both of these probe widely towards each  other although I cannot really prove that they connect. Both wounds on his lower leg anteriorly are closed and his area over the first metatarsal head on his right foot continues to improve. We are using Hydrofera Blue here. He also saw infectious disease culture of the abscess they noted was polymicrobial with MRSA, Morganella and Klebsiella he was treated with doxycycline and ciprofloxacin for 4 weeks ending on 07/03/2021. They did not recommend any further antibiotics. Notable that while he still had the Penrose drain in place last week he had purulent drainage coming out of the inferior IandD site this grew [place] [facility], MRSA and Pseudomonas but there does not appear to be any active infection in this area today with the drain out and he is not systemically unwell Electronic Signature(s) Signed: 07/20/2021 6:04:31 PM By: Linton Ham MD Entered By: Linton Ham on 07/20/2021 15:55:28 -------------------------------------------------------------------------------- Physical Exam Details Patient Name: Date of Service: [name] Garfinkel. 07/20/2021 2:15 PM Medical Record Number: [id number] Patient Account Number: [id number] Date of Birth/Sex: Treating RN: 10-02-1950 (71 y.o. Male) Deon Pilling Primary Care Provider: [name] Other Clinician: Referring Provider: Treating Provider/Extender: Stormy Card in Treatment: 3 Constitutional Patient is hypertensive.. Pulse regular and within target range for patient.Marland Kitchen Respirations regular, non-labored and within target range.. Temperature is normal and within the target range for the patient.Marland Kitchen Appears in no distress. Cardiovascular Pain was controlled in the left leg. Severe chronic venous disease. He also has PAD on the left leg but his foot is warm.. Notes Wound exam; left anterior leg wound is closed the medial wound closed several weeks ago. These were the original wounds we are dealing with. He still has a smaller  bite clean wound on the right plantar first metatarsal head. He now has the areas that they open to perform the IandD on the fluid collection these probes superiorly in the calf wound and inferiorly on the thigh wound. There is no palpable tenderness in this area no purulent drainage. Electronic Signature(s) Signed: 07/20/2021 6:04:31 PM By: Linton Ham MD Entered By: Linton Ham on 07/20/2021 15:53:52 -------------------------------------------------------------------------------- Physician Orders Details Patient Name: Date of Service: [name] Garfinkel. 07/20/2021 2:15 PM Medical Record Number: [id number] Patient Account Number: [id number] Date of Birth/Sex: Treating RN: 11-06-1950 (71 y.o. Male) Deon Pilling Primary Care Provider: [name] Other Clinician: Referring Provider: Treating Provider/Extender: Stormy Card in Treatment: 14 Verbal / Phone Orders: No Diagnosis Coding ICD-10 Coding Code Description E11.621 Type 2 diabetes mellitus with foot ulcer 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 I89.0 Lymphedema, not elsewhere classified I87.322 Chronic venous hypertension (idiopathic) with inflammation of left lower  extremity 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verity 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 of other part of left foot with other specified severity E11.42 Type 2 diabetes mellitus with diabetic polyneuropathy L02.416 Cutaneous abscess of left lower limb Follow-up Appointments ppointment in 1 week. Dellia Nims on Thursday Return A Nurse Visit: - Monday!!! Bathing/ Shower/ Hygiene May shower with protection but do not get wound dressing(s) wet. - Use a cast protector so you can shower without getting your wrap(s) wet Negative Presssure Wound Therapy Wound #22 Left,Lateral Lower Leg Wound Vac to wound continuously at 144mm/hg pressure - bring to  clinic when available Black Foam Edema Control - Lymphedema / SCD / Other Elevate legs to the level of the heart or above for 30 minutes daily and/or when sitting, a frequency of: - throughout the day Avoid standing for long periods of time. Patient to wear own compression stockings every day. - on right leg; Moisturize legs daily. - Ammonium LACTATE to BLE every day. Off-Loading Wedge shoe to: - left front foot offloader Wound Treatment Wound #18 - Metatarsal head first Wound Laterality: Plantar, Left Cleanser: Soap and Water 2 x Per Week/7 Days Discharge Instructions: May shower and wash wound with dial antibacterial soap and water prior to dressing change. Cleanser: Wound Cleanser 2 x Per Week/7 Days Discharge Instructions: Cleanse the wound with wound cleanser prior to applying a clean dressing using gauze sponges, not tissue or cotton balls. Peri-Wound Care: Triamcinolone 15 (g) 2 x Per Week/7 Days Discharge Instructions: Use triamcinolone 15 (g) as directed Peri-Wound Care: Zinc Oxide Ointment 30g tube 2 x Per Week/7 Days Discharge Instructions: Apply Zinc Oxide to periwound with each dressing change Peri-Wound Care: Sween Lotion (Moisturizing lotion) 2 x Per Week/7 Days Discharge Instructions: Apply moisturizing lotion as directed Prim Dressing: Hydrofera Blue Classic Foam, 4x4 in ary 2 x Per Week/7 Days Discharge Instructions: Moisten with saline prior to applying to wound bed Secondary Dressing: ABD Pad, 5x9 2 x Per Week/7 Days Discharge Instructions: Apply over primary dressing as directed. Secondary Dressing: Zetuvit Plus 4x8 in 2 x Per Week/7 Days Discharge Instructions: Apply over primary dressing as directed. Secondary Dressing: CarboFLEX Odor Control Dressing, 4x4 in 2 x Per Week/7 Days Discharge Instructions: Apply over primary dressing as directed. Compression Wrap: CoFlex TLC XL 2-layer Compression System 4x7 (in/yd) 2 x Per Week/7 Days Discharge Instructions: Apply  CoFlex 2-layer compression as directed. (alt for 4 layer) Wound #19 - Lower Leg Wound Laterality: Left, Anterior Cleanser: Soap and Water 2 x Per Week/7 Days Discharge Instructions: May shower and wash wound with dial antibacterial soap and water prior to dressing change. Cleanser: Wound Cleanser 2 x Per Week/7 Days Discharge Instructions: Cleanse the wound with wound cleanser prior to applying a clean dressing using gauze sponges, not tissue or cotton balls. Peri-Wound Care: Triamcinolone 15 (g) 2 x Per Week/7 Days Discharge Instructions: Use triamcinolone 15 (g) as directed Peri-Wound Care: Zinc Oxide Ointment 30g tube 2 x Per Week/7 Days Discharge Instructions: Apply Zinc Oxide to periwound with each dressing change Peri-Wound Care: Sween Lotion (Moisturizing lotion) 2 x Per Week/7 Days Discharge Instructions: Apply moisturizing lotion as directed Prim Dressing: Hydrofera Blue Classic Foam, 4x4 in 2 x Per Week/7 Days ary Discharge Instructions: Moisten with saline prior to applying to wound bed Secondary Dressing: ABD Pad, 5x9 2 x Per Week/7 Days Discharge Instructions: Apply over primary dressing as directed. Secondary Dressing: Zetuvit Plus 4x8 in 2 x Per Week/7  Days Discharge Instructions: Apply over primary dressing as directed. Secondary Dressing: CarboFLEX Odor Control Dressing, 4x4 in 2 x Per Week/7 Days Discharge Instructions: Apply over primary dressing as directed. Compression Wrap: CoFlex TLC XL 2-layer Compression System 4x7 (in/yd) 2 x Per Week/7 Days Discharge Instructions: Apply CoFlex 2-layer compression as directed. (alt for 4 layer) Wound #22 - Lower Leg Wound Laterality: Left, Lateral Cleanser: Soap and Water 2 x Per Week/7 Days Discharge Instructions: May shower and wash wound with dial antibacterial soap and water prior to dressing change. Cleanser: Wound Cleanser 2 x Per Week/7 Days Discharge Instructions: Cleanse the wound with wound cleanser prior to applying a  clean dressing using gauze sponges, not tissue or cotton balls. Peri-Wound Care: Triamcinolone 15 (g) 2 x Per Week/7 Days Discharge Instructions: Use triamcinolone 15 (g) as directed Peri-Wound Care: Zinc Oxide Ointment 30g tube 2 x Per Week/7 Days Discharge Instructions: Apply Zinc Oxide to periwound with each dressing change Peri-Wound Care: Sween Lotion (Moisturizing lotion) 2 x Per Week/7 Days Discharge Instructions: Apply moisturizing lotion as directed Prim Dressing: KerraCel Ag Gelling Fiber Dressing, 4x5 in (silver alginate) 2 x Per Week/7 Days ary Discharge Instructions: Apply silver alginate to wound bed as instructed Secondary Dressing: ABD Pad, 5x9 2 x Per Week/7 Days Discharge Instructions: Apply over primary dressing as directed. Secondary Dressing: Zetuvit Plus 4x8 in 2 x Per Week/7 Days Discharge Instructions: Apply over primary dressing as directed. Secondary Dressing: CarboFLEX Odor Control Dressing, 4x4 in 2 x Per Week/7 Days Discharge Instructions: Apply over primary dressing as directed. Compression Wrap: CoFlex TLC XL 2-layer Compression System 4x7 (in/yd) 2 x Per Week/7 Days Discharge Instructions: Apply CoFlex 2-layer compression as directed. (alt for 4 layer) Wound #23 - Upper Leg Wound Laterality: Left, Lateral Peri-Wound Care: Triamcinolone 15 (g) 2 x Per Week Discharge Instructions: Use triamcinolone 15 (g) as directed Peri-Wound Care: Sween Lotion (Moisturizing lotion) 2 x Per Week Discharge Instructions: Apply moisturizing lotion as directed Prim Dressing: KerraCel Ag Gelling Fiber Dressing, 4x5 in (silver alginate) 2 x Per Week ary Discharge Instructions: Apply silver alginate to wound bed as instructed Secondary Dressing: Zetuvit Plus Silicone Border Dressing 7x7(in/in) 2 x Per Week Discharge Instructions: Apply silicone border over primary dressing as directed. Secured With: Elastic Bandage 4 inch (ACE bandage) 2 x Per Week Discharge Instructions: Secure  with ACE bandage as directed. Electronic Signature(s) Signed: 07/20/2021 6:02:21 PM By: Deon Pilling RN, BSN Signed: 07/20/2021 6:04:31 PM By: Linton Ham MD Entered By: Deon Pilling on 07/20/2021 15:40:47 -------------------------------------------------------------------------------- Problem List Details Patient Name: Date of Service: [name] Garfinkel. 07/20/2021 2:15 PM Medical Record Number: [id number] Patient Account Number: [id number] Date of Birth/Sex: Treating RN: 31-Aug-1950 (71 y.o. Male) Baruch Gouty Primary Care Provider: [name] Other Clinician: Referring Provider: Treating Provider/Extender: Stormy Card in Treatment: 14 Active Problems ICD-10 Encounter Code Description Active Date MDM Diagnosis E11.621 Type 2 diabetes mellitus with foot ulcer 04/12/2021 No Yes 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 04/12/2021 No Yes I89.0 Lymphedema, not elsewhere classified 04/12/2021 No Yes I87.322 Chronic venous hypertension (idiopathic) with inflammation of left lower 04/12/2021 No Yes extremity 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 ulcer of other part of left lower leg with other specified 04/12/2021 No Yes severity 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 of other part of left foot with other specified 04/12/2021 No Yes severity E11.42 Type 2 diabetes mellitus with diabetic polyneuropathy 04/12/2021 No Yes 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  07/20/2021 No Yes Inactive Problems ICD-10 Code Description Active Date Inactive Date L02.416 Cutaneous abscess of left lower limb 06/13/2021 06/13/2021 Resolved Problems Electronic Signature(s) Signed: 07/20/2021 6:04:31 PM By: Linton Ham MD Entered By: Linton Ham on 07/20/2021 15:50:07 -------------------------------------------------------------------------------- Progress Note Details Patient Name: Date of  Service: [name] Garfinkel. 07/20/2021 2:15 PM Medical Record Number: [id number] Patient Account Number: [id number] Date of Birth/Sex: Treating RN: December 07, 1950 (71 y.o. Male) Deon Pilling Primary Care Provider: [name] Other Clinician: Referring Provider: Treating Provider/Extender: Stormy Card in Treatment: 14 Subjective History of Present Illness (HPI) 10/11/17; [name] is a 71 year old man who tells me that in 2015 he slipped down the latter traumatizing his left leg. He developed a wound in the same spot the area that we are currently looking at. He states this closed over for the most part although he always felt it was somewhat unstable. In 2016 he hit the same area with the door of his car had this reopened. He tells me that this is never really closed although sometimes an inflow it remains open on a constant basis. He has not been using any specific dressing to this except for topical antibiotics the nature of which were not really sure. His primary doctor did send him to see Dr. [name] of interventional cardiology. He underwent an angiogram on 08/06/17 and he underwent a PTA and directional atherectomy of the lesser distal SFA and popliteal arteries which resulted in brisk improvement in blood flow. It was noted that he had 2 vessel runoff through the anterior tibial and peroneal. He is also been to see vascular and interventional radiologist. He was not felt to have any significant superficial venous insufficiency. Presumably is not a candidate for any ablation. It was suggested he come here for wound care. The patient is a type II diabetic on insulin. He also has a history of venous insufficiency. ABIs on the left were noncompressible in our clinic 10/21/17; patient we admitted to the clinic last week. 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 We put Iodosorb on him after an aggressive  debridement and 3 layer compression. He complained of pain in his ankle and itching with is skin in fact he scratched the area on the medial calf superiorly at the rim of our wraps and he has 2 small open areas in that location today which are new. I changed his primary dressing today to silver collagen. As noted he is already had revascularization and does not have any significant superficial venous insufficiency that would be amenable to ablation 10/28/17; patient admitted to the clinic 2 weeks ago. He has a smaller Wound. Scratch injury from last week revealed. There is large wound over the tibial area. This is smaller. Granulation looks healthy. No need for debridement. 11/04/17; the wound on the left lateral calf looks better. Improved dimensions. Surface of this looks better. We've been maintaining him and Kerlix Coban wraps. He finds this much more comfortable. Silver collagen dressing 11/11/17; left lateral Wound continues to look healthy be making progress. Using a #5 curet I removed removed nonviable skin from the surface of the wound and then necrotic debris from the wound surface. Surface of the wound continues to look healthy. ooHe also has an open area on the left great toenail bed. We've been using topical antibiotics. 11/19/17; left anterior lateral wound continues to look healthy but it's not closed. ooHe also had a  small wound above this on the left leg ooInitially traumatic wounds in the setting of significant chronic venous insufficiency and stasis dermatitis 11/25/17; left anterior wounds superiorly is closed still a small wound inferiorly. 12/02/17; left anterior tibial area. Arrives today with adherent callus. Post debridement clearly not completely closed. Hydrofera Blue under 3 layer compression. 12/09/17; left anterior tibia. Circumferential eschar however the wound bed looks stable to improved. We've been using Hydrofera Blue under 3 layer compression 12/17/17; left anterior  tibia. Apparently this was felt to be closed however when the wrap was taken off there is a skin tear to reopen wounds in the same area we've been using Hydrofera Blue under 3 layer compression 12/23/17 left anterior tibia. Not close to close this week apparently the [facility] was stuck to this again. Still circumferential eschar requiring debridement. I put a contact layer on this this time under the Hydrofera Blue 12/31/17; left anterior tibia. Wound is better slight amount of hyper-granulation. Using Hydrofera Blue over Adaptic. 01/07/18; left anterior tibia. The wound had some surface eschar however after this was removed he has no open wound.he was already revascularized by Dr. [name] when he came to our clinic with atherectomy of the left SFA and popliteal artery. He was also sent to interventional radiology for venous reflux studies. He was not felt to have significant reflux but certainly has chronic venous changes of his skin with hemosiderin deposition around this area. He will definitely need to lubricate his skin and wear compression stocking and I've talked to him about this. READMISSION 05/26/2018 This is a now 71 year old man we cared for with traumatic wounds on his left anterior lower extremity. He had been previously revascularized during that admission by Dr. [name]. Apparently in follow-up Dr. [name] noted that he had deterioration in his arterial status. He underwent a stent placement in the distal left SFA on 04/22/2018. 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 He went back to the angiography suite on 04/30/2018 he underwent PTA and balloon angioplasty of the occluded left mid anterior tibial artery, thrombotic occlusion went from 100 to 0% which reconstitutes the posterior tibial artery. He had thrombectomy and aspiration of the peroneal artery. The stent placed in the distal SFA left SFA was still occluded. He was discharged on Xarelto, it was noted on the  discharge summary from this hospitalization that he had gangrene at the tip of his left fifth toe and there were expectations this would auto amputate. 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 He has been recuperating at [place] [facility] in [facility] after the most recent hospitalization. He is going home tomorrow. He tells me that 2 weeks ago he traumatized the tip of his left fifth toe. He came in urgently for our review of this. This was a history of before I noted that Dr. [name] had already noted dry gangrenous changes of the left fifth toe 06/09/2018; 2-week follow-up. I did contact Dr. [name] after his last appointment and he apparently saw 1 of Dr. Irven Shelling colleagues the next day. He does not follow-up with Dr. [name] himself until Thursday of this week. 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 Nevertheless there is no evidence of infection no drainage and no pain. He had a new area that this week when we were signing him in today on the left anterior mid tibia area, this is in close proximity to the  previous wound we have dealt with in this clinic. 06/23/2018; 2-week follow-up. I did not receive a recent note from Dr. [name] to review today. Our office is trying to obtain this. He is apparently not planning to do further vascular interventions and wondered about compression to try and help with the patient's chronic venous insufficiency. However we are also concerned about the arterial flow. ooHe arrives in clinic today with a new area on the left third toe. The areas on the calf/anterior tibia are close to closing. The left fifth toe is still mummified using Betadine. -In reviewing things with the patient he has what sounds like claudication with mild to moderate amount of activity. 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 I prescribed Levaquin over the phone while we attempted to  arrange a plan of care. However the patient called yesterday to report he had low-grade fever and he came in today acutely. There is been a marked deterioration in the left third toe with spreading cellulitis up into the dorsal left foot. He was referred to the emergency room. Readmission: 06/29/2020 patient presents today for reevaluation here in our clinic he was previously treated by Dr. Dellia Nims at the latter part of 2019 in 2 the beginning of 2020. Subsequently we have not seen him since that time in the interim he did have evaluation with vein and vascular specialist specifically Dr. Anice Paganini who did perform quite extensive work for a left femoral to anterior tibial artery bypass. With that being said in the interim the patient has developed significant lymphedema and has wounds that he tells me have really never healed in regard to the incision site on the left leg. He also has multiple wounds on the feet for various reasons some of which is that he tends to pick at his feet. Fortunately there is no signs of active infection systemically at this time he does have some wounds that are little bit deeper but most are fairly superficial he seems to have good blood flow and overall everything appears to be healthy I see no bone exposed and no obvious signs of osteomyelitis. I do not know that he necessarily needs a x-ray at this point although that something we could consider depending on how things progress. The patient does have a history of lymphedema, diabetes, this is type II, chronic kidney disease stage III, hypertension, and history of peripheral vascular disease. 07/05/2020; patient admitted last week. Is a patient I remember from 2019 he had a spreading infection involving the left foot and we sent him to the hospital. 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 He subsequently had a left femoral to anterior tibial bypass by Dr.Cain vein and vascular.  He also has severe lymphedema with chronic skin changes related to that on the left leg. The most problematic area that was new today was on the left medial great toe. This was apparently a small area last week there was purulent drainage which our intake nurse cultured. Also areas on the left medial foot and heel left lateral foot. He has 2 areas on the left medial calf left lateral calf in the setting of the severe lymphedema. 07/13/2020 on evaluation today patient appears to be doing better in my opinion compared to his last visit. The good news is there is no signs of active infection systemically and locally I do not see any signs of infection either. He did have  an x-ray which was negative that is great news he had a culture which showed MRSA but at the same time he is been on the doxycycline which has helped. I do think we may want to extend this for 7 additional days 1/25; patient admitted to the clinic a few weeks ago. 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 We have been putting him under compression his edema control is a lot better but he is severe verricused skin on the left leg. He is really done quite well he still has an open area on the left medial calf and the left medial first metatarsal head. We have been using silver collagen on the leg silver alginate on the foot 07/27/2020 upon evaluation today patient appears to be doing decently well in regard to his wounds. He still has a lot of dry skin on the left leg. Some of this is starting to peel back and I think he may be able to have them out by removing some that today. Fortunately there is no signs of active infection at this time on the left leg although on the right leg he does appear to have swelling and erythema as well as some mild warmth to touch. This does have been concerned about the possibility of cellulitis although within the differential diagnosis I do think that potentially a  DVT has to be at least considered. We need to rule that out before proceeding would just call in the cellulitis. Especially since he is having pain in the posterior aspect of his calf muscle. 2/8; the patient had seen sparingly. He has severe skin changes of chronic lymphedema in the left leg thickened hyperkeratotic verrucous skin. He has an open wound on the medial part of the left first met head left mid tibia. He also has a rim of nonepithelialized skin in the anterior mid tibia. He brought in the AmLactin lotion that was been prescribed although I am not sure under compression and its utility. There concern about cellulitis on the right lower leg the last time he was here. He was put on on antibiotics. His DVT rule out was negative. The right leg looks fine he is using his stocking on this area 08/10/2020 upon evaluation today patient appears to be doing well with regard to his leg currently. He has been tolerating the dressing changes without complication. Fortunately there is no signs of active infection which is great news. Overall very pleased with where things stand. 2/22; the patient still has an area on the medial part of the left first met his head. This looks better than when I last saw this earlier this month he has a rim of epithelialization but still some surface debris. Mostly everything on the left leg is healed. There is still a vulnerable in the left mid tibia area. 08/30/2020 upon evaluation today patient appears to be doing much better in regard to his wounds on his foot. 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 Nonetheless I think we will address that today I Minna send in a prescription for him in that regard. Overall though there does not appear to be any signs of significant worsening. 09/07/2020 on evaluation today patient's wounds over his  left foot appear to be doing excellent. I do not see any signs of infection there is some  callus buildup this can require debridement for certain but overall I feel like he is managing quite nicely. He still using the AmLactin cream which has been beneficial for him as well. 3/22; left foot wound is closed. There is no open area here. He is using ammonium lactate lotion to the lower extremities to help exfoliate dry cracked skin. He has compression stockings from elastic therapy in [place]. The wound on the medial part of his left first met head is healed today. READMISSION 04/12/2021 [name] is a patient we know fairly well he had a prolonged stay in clinic in 2019 with wounds on his left lateral and left anterior lower extremity in the setting of chronic venous insufficiency. More recently he was here earlier this year with predominantly an area on his left foot first metatarsal head plantar and he says the plantar foot broke down on its not long after we discharged him but he did not come back here. The last few months areas of broken down on his left anterior and again the left lateral lower extremity. The leg itself is very swollen chronically enlarged a lot of hyperkeratotic dry Berry Q skin in the left lower leg. His edema extends well into the thigh. He was seen by Dr. Donzetta Matters. He had ABIs on 03/02/2021 showing an ABI on the right of 1 with a TBI of 0.72 his ABI in the left at 1.09 TBI of 0.99. Monophasic and biphasic waveforms on the right. On the left monophasic waveforms were noted he went on to have an angiogram on 03/27/2021 this showed the aortic aortic and iliac segments were free of flow-limiting stenosis the left common femoral vein to evaluate the left femoral to anterior tibial artery bypass was unobstructed the bypass was patent without any areas of stenosis. We discharged the patient in bilateral juxta lite stockings but very clearly that was not sufficient to control the swelling  and maintain skin integrity. He is clearly going to need compression pumps. 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 This is fortunate because he is on his feet for long periods of time. 10/27; patient comes in with our intake nurse reporting copious amount of green drainage from the left anterior mid tibia the left dorsal foot and to a lesser extent the left medial mid tibia. We left the compression wrap on all week for the amount of edema in his left leg is quite a bit better. We use silver alginate as the primary dressing 11/3; edema control is good. Left anterior lower leg left medial lower leg and the plantar first metatarsal head. The left anterior lower leg required debridement. Deep tissue culture I did of this wound showed MRSA I put him on 10 days of doxycycline which she will start today. We have him in compression wraps. He has a security card and [employer] however he is retiring on November 15. We will need to then get him into a better offloading boot for the left foot perhaps a total contact cast 11/10; edema control is quite good. Left anterior and left medial lower leg wounds in the setting of chronic venous insufficiency and lymphedema. He also has a substantial area over the left plantar first metatarsal head. 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 He has significant hypergranulation on the left plantar foot wound.  The patient is a diabetic but he does not have significant PAD 11/17; edema control is quite good. Left anterior and left medial lower leg wounds look better. The really concerning area remains the area on the left plantar first metatarsal head. He has a rim of epithelialization. 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  Starting today with a forefoot off loader but . possibly a total contact cast. 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 Arrives with a very significant odor from I think all of the wounds on the left leg and the left foot. Because of this I did not put a total contact cast on him today but will could still consider this. His wife was having cataract surgery which is the reason he missed the appointment 12/6. I saw this man 5 days ago with a swelling below the popliteal fossa. I thought he actually might have a Baker's cyst however the DVT rule out study that we could arrange right away was negative the technician told me this was not a ruptured Baker's cyst. We attempted to get this aspirated by under ultrasound guidance in interventional radiology however all they did was an ultrasound however it shows an extensive fluid collection 62 x 8 x 9.4 in the left thigh and left calf. The patient states he thinks this started 8 days ago or so but he really is not complaining of any pain, fever or systemic symptoms. He has not ha 12/20; after some difficulty I managed to get the patient into see Dr. Donzetta Matters. Eventually he was taken into the hospital and had a drain put in the fluid collection below his left knee posteriorly extending into the posterior thigh. He still has the drain in place. Culture of this showed moderate staff aureus few Morganella and few Klebsiella he is now on doxycycline and ciprofloxacin as suggested by infectious disease he is on this for a month. The drain will remain in place until it stops draining 12/29; he comes in today with the 1 wound on his left leg and the area on the left plantar first met head significantly smaller. Both look healthy. He still has the drain in the left leg. He says he has to change this daily. Follows up with Dr. Donzetta Matters on January  11. 06/29/2021; the wounds that I am following on the left leg and left first met head continued to be quite healthy. However the area where his inferior drain is in place had copious amounts of drainage which was green in color. The wound here is larger. Follows up with Dr. Gwenlyn Saran of vein and vascular his surgeon next week as well as infectious disease. He remains on ciprofloxacin and doxycycline. He is not complaining of excessive pain in either one of the drain areas 1/12; the patient saw vascular surgery and infectious disease. Vascular surgery has left the drain in place as there was still some notable drainage still see him back in 2 weeks. Dr. Velna Ochs stop the doxycycline and ciprofloxacin and I do not believe he follows up with them at this point. Culture I did last week showed both doxycycline resistant MRSA and Pseudomonas not sensitive to ciprofloxacin although only in rare titers 1/19; the patient's wound on the  left anterior lower leg is just about healed. We have continued healing of the area that was medially on the left leg. Left first plantar metatarsal head continues to get smaller. The major problem here is his 2 drain sites 1 on the left upper calf and lateral thigh. There is purulent drainage still from the left lateral thigh. I gave him antibiotics last week but we still have recultured. He has the drain in the area I think this is eventually going to have to come out. I suspect there will be a connecting wound to heal here perhaps with improved VAc 1/26; the patient had his drain removed by vein and vascular on 1/25/. This was a large pocket of fluid in his left thigh that seem to tunnel into his left upper calf. 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 His mention his Penrose drain was removed today. He now has a tunneling wound on his left calf and left thigh. Both of these probe widely towards each other although I cannot really prove that they connect. Both  wounds on his lower leg anteriorly are closed and his area over the first metatarsal head on his right foot continues to improve. We are using Hydrofera Blue here. He also saw infectious disease culture of the abscess they noted was polymicrobial with MRSA, Morganella and Klebsiella he was treated with doxycycline and ciprofloxacin for 4 weeks ending on 07/03/2021. They did not recommend any further antibiotics. Notable that while he still had the Penrose drain in place last week he had purulent drainage coming out of the inferior IandD site this grew Dexter ER, MRSA and Pseudomonas but there does not appear to be any active infection in this area today with the drain out and he is not systemically unwell Objective Constitutional Patient is hypertensive.. Pulse regular and within target range for patient.Marland Kitchen Respirations regular, non-labored and within target range.. Temperature is normal and within the target range for the patient.Marland Kitchen Appears in no distress. Vitals Time Taken: 2:47 PM, Height: 74 in, Weight: 238 lbs, BMI: 30.6, Temperature: 98.2 F, Pulse: 76 bpm, Respiratory Rate: 18 breaths/min, Blood Pressure: 184/76 mmHg, Capillary Blood Glucose: 283 mg/dl. Cardiovascular Pain was controlled in the left leg. Severe chronic venous disease. He also has PAD on the left leg but his foot is warm.. General Notes: Wound exam; left anterior leg wound is closed the medial wound closed several weeks ago. These were the original wounds we are dealing with. He still has a smaller bite clean wound on the right plantar first metatarsal head. He now has the areas that they open to perform the IandD on the fluid collection these probes superiorly in the calf wound and inferiorly on the thigh wound. There is no palpable tenderness in this area no purulent drainage. Integumentary (Hair, Skin) Wound #18 status is Open. Original cause of wound was Gradually Appeared. The date acquired was: 08/23/2020. The wound has  been in treatment 14 weeks. The wound is located on the Left,Plantar Metatarsal head first. The wound measures 1.5cm length x 2cm width x 0.1cm depth; 2.356cm^2 area and 0.236cm^3 volume. There is Fat Layer (Subcutaneous Tissue) exposed. There is no tunneling or undermining noted. There is a medium amount of serosanguineous drainage noted. The wound margin is distinct with the outline attached to the wound base. There is large (67-100%) red granulation within the wound bed. There is no necrotic tissue within the wound bed. Wound #19 status is Open. Original cause of wound  was Gradually Appeared. The date acquired was: 08/23/2020. The wound has been in treatment 14 weeks. The wound is located on the Left,Anterior Lower Leg. The wound measures 0.2cm length x 0.2cm width x 0.1cm depth; 0.031cm^2 area and 0.003cm^3 volume. There is Fat Layer (Subcutaneous Tissue) exposed. There is no tunneling or undermining noted. There is a small amount of serosanguineous drainage noted. The wound margin is distinct with the outline attached to the wound base. There is large (67-100%) red granulation within the wound bed. There is no necrotic tissue within the wound bed. Wound #22 status is Open. Original cause of wound was Bump. The date acquired was: 06/03/2021. The wound has been in treatment 6 weeks. The wound is located on the Left,Lateral Lower Leg. The wound measures 3cm length x 2.2cm width x 1cm depth; 5.184cm^2 area and 5.184cm^3 volume. There is Fat Layer (Subcutaneous Tissue) exposed. There is no undermining noted, however, there is tunneling at 12:00 with a maximum distance of 14cm. There is a large amount of purulent drainage noted. The wound margin is distinct with the outline attached to the wound base. There is large (67-100%) red granulation within the wound bed. There is a small (1-33%) amount of necrotic tissue within the wound bed including Adherent Slough. Wound #23 status is Open. Original cause of  wound was Laceration. The date acquired was: 06/05/2021. The wound is located on the Left,Lateral Upper Leg. The wound measures 0.3cm length x 0.9cm width x 0.8cm depth; 0.212cm^2 area and 0.17cm^3 volume. There is Fat Layer (Subcutaneous Tissue) exposed. There is no undermining noted, however, there is tunneling at 6:00 with a maximum distance of 4cm. There is a medium amount of serosanguineous drainage noted. The wound margin is distinct with the outline attached to the wound base. There is large (67-100%) red granulation within the wound bed. There is no necrotic tissue within the wound bed. 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 There was a Double Layer Compression Therapy Procedure by Baruch Gouty, RN. Post procedure Diagnosis Wound #22: Same as Pre-Procedure Plan Follow-up Appointments: Return Appointment in 1 week. Dellia Nims on Thursday Nurse Visit: - Monday!!! Bathing/ Shower/ Hygiene: May shower with protection but do not get wound dressing(s) wet. - Use a cast protector so you can shower without getting your wrap(s) wet Negative Presssure Wound Therapy: Wound #22 Left,Lateral Lower Leg: Wound Vac to wound continuously at 199mm/hg pressure - bring to clinic when available Black Foam Edema Control - Lymphedema / SCD / Other: Elevate legs to the  level of the heart or above for 30 minutes daily and/or when sitting, a frequency of: - throughout the day Avoid standing for long periods of time. Patient to wear own compression stockings every day. - on right leg; Moisturize legs daily. - Ammonium LACTATE to BLE every day. Off-Loading: Wedge shoe to: - left front foot offloader WOUND #18: - Metatarsal head first Wound Laterality: Plantar, Left Cleanser: Soap and Water 2 x Per Week/7 Days  Discharge Instructions: May shower and wash wound with dial antibacterial soap and water prior to dressing change. Cleanser: Wound Cleanser 2 x Per Week/7 Days Discharge Instructions: Cleanse the wound with wound cleanser prior to applying a clean dressing using gauze sponges, not tissue or cotton balls. Peri-Wound Care: Triamcinolone 15 (g) 2 x Per Week/7 Days Discharge Instructions: Use triamcinolone 15 (g) as directed Peri-Wound Care: Zinc Oxide Ointment 30g tube 2 x Per Week/7 Days Discharge Instructions: Apply Zinc Oxide to periwound with each dressing change Peri-Wound Care: Sween Lotion (Moisturizing lotion) 2 x Per Week/7 Days Discharge Instructions: Apply moisturizing lotion as directed Prim Dressing: Hydrofera Blue Classic Foam, 4x4 in 2 x Per Week/7 Days ary Discharge Instructions: Moisten with saline prior to applying to wound bed Secondary Dressing: ABD Pad, 5x9 2 x Per Week/7 Days Discharge Instructions: Apply over primary dressing as directed. Secondary Dressing: Zetuvit Plus 4x8 in 2 x Per Week/7 Days Discharge Instructions: Apply over primary dressing as directed. Secondary Dressing: CarboFLEX Odor Control Dressing, 4x4 in 2 x Per Week/7 Days Discharge Instructions: Apply over primary dressing as directed. Com pression Wrap: CoFlex TLC XL 2-layer Compression System 4x7 (in/yd) 2 x Per Week/7 Days Discharge Instructions: Apply CoFlex 2-layer compression as directed. (alt for 4 layer) WOUND #19: - Lower Leg Wound Laterality: Left,  Anterior Cleanser: Soap and Water 2 x Per Week/7 Days Discharge Instructions: May shower and wash wound with dial antibacterial soap and water prior to dressing change. Cleanser: Wound Cleanser 2 x Per Week/7 Days Discharge Instructions: Cleanse the wound with wound cleanser prior to applying a clean dressing using gauze sponges, not tissue or cotton balls. Peri-Wound Care: Triamcinolone 15 (g) 2 x Per Week/7 Days Discharge Instructions: Use triamcinolone 15 (g) as directed Peri-Wound Care: Zinc Oxide Ointment 30g tube 2 x Per Week/7 Days Discharge Instructions: Apply Zinc Oxide to periwound with each dressing change Peri-Wound Care: Sween Lotion (Moisturizing lotion) 2 x Per Week/7 Days Discharge Instructions: Apply moisturizing lotion as directed Prim Dressing: Hydrofera Blue Classic Foam, 4x4 in 2 x Per Week/7 Days ary Discharge Instructions: Moisten with saline prior to applying to wound bed Secondary Dressing: ABD Pad, 5x9 2 x Per Week/7 Days Discharge Instructions: Apply over primary dressing as directed. Secondary Dressing: Zetuvit Plus 4x8 in 2 x Per Week/7 Days Discharge Instructions: Apply over primary dressing as directed. Secondary Dressing: CarboFLEX Odor Control Dressing, 4x4 in 2 x Per Week/7 Days Discharge Instructions: Apply over primary dressing as directed. Com pression Wrap: CoFlex TLC XL 2-layer Compression System 4x7 (in/yd) 2 x Per Week/7 Days Discharge Instructions: Apply CoFlex 2-layer compression as directed. (alt for 4 layer) WOUND #22: - Lower Leg Wound Laterality: Left, Lateral Cleanser: Soap and Water 2 x Per Week/7 Days Discharge Instructions: May shower and wash wound with dial antibacterial soap and water prior to dressing change. Cleanser: Wound Cleanser 2 x Per Week/7 Days Discharge Instructions: Cleanse the wound with wound cleanser prior to applying a clean dressing using gauze sponges, not tissue or cotton balls. Peri-Wound Care: Triamcinolone 15 (g) 2  x Per Week/7 Days Discharge Instructions: Use triamcinolone 15 (g) as directed Peri-Wound Care: Zinc Oxide Ointment 30g tube 2 x Per Week/7 Days Discharge Instructions: Apply Zinc Oxide to periwound with each dressing change Peri-Wound Care: Sween Lotion (Moisturizing lotion) 2 x Per Week/7 Days Discharge Instructions: Apply moisturizing lotion as directed Prim Dressing: KerraCel Ag Gelling Fiber Dressing, 4x5 in (silver alginate) 2 x Per Week/7 Days ary Discharge Instructions: Apply silver alginate  to wound bed as instructed Secondary Dressing: ABD Pad, 5x9 2 x Per Week/7 Days Discharge Instructions: Apply over primary dressing as directed. Secondary Dressing: Zetuvit Plus 4x8 in 2 x Per Week/7 Days Discharge Instructions: Apply over primary dressing as directed. Secondary Dressing: CarboFLEX Odor Control Dressing, 4x4 in 2 x Per Week/7 Days Discharge Instructions: Apply over primary dressing as directed. Com pression Wrap: CoFlex TLC XL 2-layer Compression System 4x7 (in/yd) 2 x Per Week/7 Days Discharge Instructions: Apply CoFlex 2-layer compression as directed. (alt for 4 layer) WOUND #23: - Upper Leg Wound Laterality: Left, Lateral Peri-Wound Care: Triamcinolone 15 (g) 2 x Per Week/ Discharge Instructions: Use triamcinolone 15 (g) as directed Peri-Wound Care: Sween Lotion (Moisturizing lotion) 2 x Per Week/ Discharge Instructions: Apply moisturizing lotion as directed Prim Dressing: KerraCel Ag Gelling Fiber Dressing, 4x5 in (silver alginate) 2 x Per Week/ ary Discharge Instructions: Apply silver alginate to wound bed as instructed Secondary Dressing: Zetuvit Plus Silicone Border Dressing 7x7(in/in) 2 x Per Week/ Discharge Instructions: Apply silicone border over primary dressing as directed. Secured With: Elastic Bandage 4 inch (ACE bandage) 2 x Per Week/ Discharge Instructions: Secure with ACE bandage as directed. 1. The 2 sites where the Penrose drain was on the left calf and  left thigh laterally will be dressed with silver alginate as best we could do. 2. I suspect this will need a wound VAC with a proximal seal 3. We are continue with Hydrofera Blue in the left foot. Still in a Coflex 2 No air. We will begin to explore this today #4 I did not feel that he required systemic antibiotics. There is no tenderness or edema in the area. And no visible purulent drainage although there was some slightly green drainage on his dressing I am going to follow this and be vigilant. He ultimately might require oral antibiotics. I did not feel strongly about this today Electronic Signature(s) Signed: 07/20/2021 6:04:31 PM By: Linton Ham MD Entered By: Linton Ham on 07/20/2021 15:57:10 -------------------------------------------------------------------------------- SuperBill Details Patient Name: Date of Service: [name] Garfinkel. 07/20/2021 Medical Record Number: [id number] Patient Account Number: [id number] Date of Birth/Sex: Treating RN: 07-12-1950 (71 y.o. Male) Deon Pilling Primary Care Provider: [name] Other Clinician: Referring Provider: Treating Provider/Extender: Stormy Card in Treatment: 14 Diagnosis Coding ICD-10 Codes Code Description E11.621 Type 2 diabetes mellitus with foot ulcer 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 I89.0 Lymphedema, not elsewhere classified I87.322 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verity 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 of other part of left foot with other specified severity E11.42 Type 2 diabetes mellitus with diabetic polyneuropathy 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 Facility Procedures CPT4 Code: [id number] Description: (Facility Use Only) [id number] - APPLY [place] LWR LT LEG Modifier: Quantity: 1 Physician Procedures : CPT4 Code Description Modifier [id number] - WC PHYS LEVEL 4 - EST PT ICD-10 Diagnosis Description E11.621 Type 2 diabetes mellitus with foot ulcer 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.128  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

## 2021-07-20 NOTE — Progress Notes (Addendum)
[name] ([id number]) Visit Report for 07/20/2021 Arrival Information Details Patient Name: Date of Service: [name], [name] 07/20/2021 2:15 PM Medical Record Number: [id number] Patient Account Number: [id number] Date of Birth/Sex: Treating RN: [birth year] (71 y.o. [name], [name] Primary Care Safi Culotta: [name] Other Clinician: Referring Simra Fiebig: Treating Kenlie Seki/Extender: Stormy Card in Treatment: 14 Visit Information History Since Last Visit Added or deleted any medications: No Patient Arrived: Ambulatory Any new allergies or adverse reactions: No Arrival Time: 14:47 Had a fall or experienced change in No Accompanied By: self activities of daily living that may affect Transfer Assistance: None risk of falls: Patient Identification Verified: Yes Signs or symptoms of abuse/neglect since last visito No Secondary Verification Process Completed: Yes Hospitalized since last visit: No Patient Requires Transmission-Based Precautions: No Implantable device outside of the clinic excluding No Patient Has Alerts: Yes cellular tissue based products placed in the center Patient Alerts: ABI's: 09/22 L:1.09 R: 1. since last visit: TBI's: R:0.72 L:0.99 Has Dressing in Place as Prescribed: Yes Pain Present Now: No Electronic Signature(s) Signed: 07/20/2021 3:23:49 PM By: Sandre Kitty Entered By: Sandre Kitty on 07/20/2021 14:47:30 -------------------------------------------------------------------------------- Complex / Palliative Patient Assessment Details Patient Name: Date of Service: [name], [name] 07/20/2021 2:15 PM Medical Record Number: [id number] Patient Account Number: [id number] Date of Birth/Sex: Treating RN: [birth year] (71 y.o. [name] Primary Care Rickesha Veracruz: [name] Other Clinician: Referring Joelle Flessner: Treating Kingston Guiles/Extender: Stormy Card in Treatment: 14 Complex Wound Management  Criteria Patient has remarkable or complex co-morbidities requiring medications or treatments that extend wound healing times. Examples: Diabetes mellitus with chronic renal failure or end stage renal disease requiring dialysis Advanced or poorly controlled rheumatoid arthritis Diabetes mellitus and end stage chronic obstructive pulmonary disease Active cancer with current chemo- or radiation therapy Type 2 DM, PVD, PAD, CKD-3, LARGE ABSCESS WITH DRAIN Palliative Wound Management Criteria Care Approach Wound Care Plan: Complex Wound Management Electronic Signature(s) Signed: 07/25/2021 4:28:46 PM By: Linton Ham MD Signed: 07/25/2021 5:37:15 PM By: Levan Hurst RN, BSN Entered By: Levan Hurst on 07/24/2021 12:47:16 -------------------------------------------------------------------------------- Compression Therapy Details Patient Name: Date of Service: [name]. 07/20/2021 2:15 PM Medical Record Number: [id number] Patient Account Number: [id number] Date of Birth/Sex: Treating RN: 02-10-1951 (71 y.o. [name] Primary Care Corina Stacy: [name] Other Clinician: Referring Dora Simeone: Treating Jashanti Clinkscale/Extender: Stormy Card in Treatment: 14 Compression Therapy Performed for Wound Assessment: Wound #22 Left,Lateral Lower Leg Performed By: Clinician Baruch Gouty, RN Compression Type: Double Layer Post Procedure Diagnosis Same as Pre-procedure Electronic Signature(s) Signed: 07/20/2021 6:02:21 PM By: Deon Pilling RN, BSN Entered By: Deon Pilling on 07/20/2021 15:41:21 -------------------------------------------------------------------------------- Encounter Discharge Information Details Patient Name: Date of Service: [name]. 07/20/2021 2:15 PM Medical Record Number: [id number] Patient Account Number: [id number] Date of Birth/Sex: Treating RN: 08/21/1950 (71 y.o. [name] Primary Care Maury Groninger: [name] Other  Clinician: Referring Tashi Band: Treating Kenner Lewan/Extender: Stormy Card in Treatment: 14 Encounter Discharge Information Items Discharge Condition: Stable Ambulatory Status: Ambulatory Discharge Destination: Home Transportation: Private Auto Accompanied By: self Schedule Follow-up Appointment: Yes Clinical Summary of Care: Patient Declined Electronic Signature(s) Signed: 07/20/2021 6:02:21 PM By: Deon Pilling RN, BSN Entered By: Deon Pilling on 07/20/2021 16:03:06 -------------------------------------------------------------------------------- Lower Extremity Assessment Details Patient Name: Date of Service: [name]. 07/20/2021 2:15 PM Medical Record Number: [id number] Patient Account Number: [id number] Date of Birth/Sex: Treating RN: [birth year] (71 y.o. [name] Primary Care  Caretha Rumbaugh: [name] Other Clinician: Referring Frutoso Dimare: Treating Jonell Brumbaugh/Extender: Stormy Card in Treatment: 14 Edema Assessment Assessed: [name]: Yes] [name]: No] Edema: [Left: Ye] [Right: s] Calf Left: Right: Point of Measurement: 41 cm From Medial Instep 47 cm Ankle Left: Right: Point of Measurement: 10 cm From Medial Instep 27 cm Vascular Assessment Pulses: Dorsalis Pedis Palpable: [Left:Yes] Electronic Signature(s) Signed: 07/20/2021 6:02:21 PM By: Deon Pilling RN, BSN Entered By: Deon Pilling on 07/20/2021 15:08:53 -------------------------------------------------------------------------------- Multi Wound Chart Details Patient Name: Date of Service: [name]. 07/20/2021 2:15 PM Medical Record Number: [id number] Patient Account Number: [id number] Date of Birth/Sex: Treating RN: 1950/09/03 (71 y.o. [name], [name] Primary Care Arline Ketter: [name] Other Clinician: Referring Matison Nuccio: Treating Donathan Buller/Extender: Stormy Card in Treatment: 14 Vital Signs Height(in): 47 Capillary Blood  Glucose(mg/dl): 283 Weight(lbs): 238 Pulse(bpm): 87 Body Mass Index(BMI): 30.6 Blood Pressure(mmHg): 184/76 Temperature(F): 98.2 Respiratory Rate(breaths/min): 18 Photos: Left, Plantar Metatarsal head first Left, Anterior Lower Leg Left, Lateral Lower Leg Wound Location: Gradually Appeared Gradually Appeared Bump Wounding Event: Diabetic Wound/Ulcer of the Lower Diabetic Wound/Ulcer of the Lower Cyst Primary Etiology: Extremity Extremity Glaucoma, Sleep Apnea, Glaucoma, Sleep Apnea, Glaucoma, Sleep Apnea, Comorbid History: Hypertension, Peripheral Arterial Hypertension, Peripheral Arterial Hypertension, Peripheral Arterial Disease, Peripheral Venous Disease, Disease, Peripheral Venous Disease, Disease, Peripheral Venous Disease, Type II Diabetes, Gout, Osteoarthritis, Type II Diabetes, Gout, Osteoarthritis, Type II Diabetes, Gout, Osteoarthritis, Neuropathy Neuropathy Neuropathy 08/23/2020 08/23/2020 06/03/2021 Date Acquired: 14 14 6  Weeks of Treatment: Open Open Open Wound Status: No No No Wound Recurrence: 1.5x2x0.1 0.2x0.2x0.1 3x2.2x1 Measurements L x W x D (cm) 2.356 0.031 5.184 A (cm) : rea 0.236 0.003 5.184 Volume (cm) : 83.30% 99.80% -214.40% % Reduction in A rea: 91.70% 99.90% -293.00% % Reduction in Volume: 12 Position 1 (o'clock): 14 Maximum Distance 1 (cm): No No Yes Tunneling: Grade 2 Grade 2 Full Thickness With Exposed Support Classification: Structures Medium Small Large Exudate Amount: Serosanguineous Serosanguineous Purulent Exudate Type: red, brown red, brown yellow, brown, green Exudate Color: Distinct, outline attached Distinct, outline attached Distinct, outline attached Wound Margin: Large (67-100%) Large (67-100%) Large (67-100%) Granulation Amount: Red Red Red Granulation Quality: None Present (0%) None Present (0%) Small (1-33%) Necrotic Amount: Fat Layer (Subcutaneous Tissue): Yes Fat Layer (Subcutaneous Tissue): Yes Fat Layer  (Subcutaneous Tissue): Yes Exposed Structures: Fascia: No Fascia: No Fascia: No Tendon: No Tendon: No Tendon: No Muscle: No Muscle: No Muscle: No Joint: No Joint: No Joint: No Bone: No Bone: No Bone: No Medium (34-66%) Large (67-100%) Small (1-33%) Epithelialization: N/A N/A Compression Therapy Procedures Performed: Wound Number: 23 N/A N/A Photos: N/A N/A Left, Lateral Upper Leg N/A N/A Wound Location: Laceration N/A N/A Wounding Event: Cyst N/A N/A Primary Etiology: Glaucoma, Sleep Apnea, N/A N/A Comorbid History: Hypertension, Peripheral Arterial Disease, Peripheral Venous Disease, Type II Diabetes, Gout, Osteoarthritis, Neuropathy 06/05/2021 N/A N/A Date Acquired: 0 N/A N/A Weeks of Treatment: Open N/A N/A Wound Status: No N/A N/A Wound Recurrence: 0.3x0.9x0.8 N/A N/A Measurements L x W x D (cm) 0.212 N/A N/A A (cm) : rea 0.17 N/A N/A Volume (cm) : 0.00% N/A N/A % Reduction in A rea: 0.00% N/A N/A % Reduction in Volume: 6 Position 1 (o'clock): 4 Maximum Distance 1 (cm): Yes N/A N/A Tunneling: Full Thickness Without Exposed N/A N/A Classification: Support Structures Medium N/A N/A Exudate Amount: Serosanguineous N/A N/A Exudate Type: red, brown N/A N/A Exudate Color: Distinct, outline attached N/A N/A Wound Margin: Large (67-100%) N/A N/A Granulation  Amount: Red N/A N/A Granulation Quality: None Present (0%) N/A N/A Necrotic Amount: Fat Layer (Subcutaneous Tissue): Yes N/A N/A Exposed Structures: Fascia: No Tendon: No Muscle: No Joint: No Bone: No None N/A N/A Epithelialization: N/A N/A N/A Procedures Performed: Treatment Notes Electronic Signature(s) Signed: 07/20/2021 6:02:21 PM By: Deon Pilling RN, BSN Signed: 07/20/2021 6:04:31 PM By: Linton Ham MD Entered By: Linton Ham on 07/20/2021 15:50:19 -------------------------------------------------------------------------------- Multi-Disciplinary Care Plan  Details Patient Name: Date of Service: [name]. 07/20/2021 2:15 PM Medical Record Number: [id number] Patient Account Number: [id number] Date of Birth/Sex: Treating RN: 10/26/1950 (71 y.o. [name] Primary Care Willer Osorno: [name] Other Clinician: Referring Shahin Knierim: Treating Racquel Arkin/Extender: Stormy Card in Treatment: [place] reviewed with physician Active Inactive Soft Tissue Infection Nursing Diagnoses: Impaired tissue integrity Potential for infection: soft tissue Goals: Patient's soft tissue infection will resolve Date Initiated: 04/20/2021 Target Resolution Date: 07/28/2021 Goal Status: Active Interventions: Assess signs and symptoms of infection every visit Provide education on infection Treatment Activities: Culture : 04/20/2021 Education provided on Infection : 07/13/2021 Systemic antibiotics : 04/20/2021 T ordered outside of clinic : 04/20/2021 est Notes: Wound/Skin Impairment Nursing Diagnoses: Impaired tissue integrity Knowledge deficit related to ulceration/compromised skin integrity Goals: Patient will have a decrease in wound volume by X% from date: (specify in notes) Date Initiated: 04/12/2021 Date Inactivated: 04/27/2021 Target Resolution Date: 04/23/2021 Goal Status: Met Patient/caregiver will verbalize understanding of skin care regimen Date Initiated: 04/12/2021 Target Resolution Date: 07/29/2021 Goal Status: Active Ulcer/skin breakdown will have a volume reduction of 30% by week 4 Date Initiated: 04/12/2021 Date Inactivated: 04/27/2021 Target Resolution Date: 04/27/2021 Goal Status: Unmet Unmet Reason: infection Ulcer/skin breakdown will have a volume reduction of 50% by week 8 Date Initiated: 04/27/2021 Date Inactivated: 06/29/2021 Target Resolution Date: 06/24/2021 Goal Status: Met Interventions: Assess patient/caregiver ability to obtain necessary supplies Assess  patient/caregiver ability to perform ulcer/skin care regimen upon admission and as needed Assess ulceration(s) every visit Notes: Electronic Signature(s) Signed: 07/20/2021 6:32:11 PM By: Baruch Gouty RN, BSN Entered By: Baruch Gouty on 07/20/2021 15:30:38 -------------------------------------------------------------------------------- Pain Assessment Details Patient Name: Date of Service: [name]. 07/20/2021 2:15 PM Medical Record Number: [id number] Patient Account Number: [id number] Date of Birth/Sex: Treating RN: [birth year] (71 y.o. [name] Primary Care Aedan Geimer: [name] Other Clinician: Referring Dylan Ruotolo: Treating Sarit Sparano/Extender: Stormy Card in Treatment: 14 Active Problems Location of Pain Severity and Description of Pain Patient Has Paino No Site Locations Pain Management and Medication Current Pain Management: Electronic Signature(s) Signed: 07/20/2021 3:23:49 PM By: Sandre Kitty Signed: 07/20/2021 6:02:21 PM By: Deon Pilling RN, BSN Entered By: Sandre Kitty on 07/20/2021 14:48:13 -------------------------------------------------------------------------------- Patient/Caregiver Education Details Patient Name: Date of Service: [name] 1/26/2023andnbsp2:15 PM Medical Record Number: [id number] Patient Account Number: [id number] Date of Birth/Gender: Treating RN: 1951/02/11 (71 y.o. [name] Primary Care Physician: [name] Other Clinician: Referring Physician: Treating Physician/Extender: Stormy Card in Treatment: 14 Education Assessment Education Provided To: Patient Education Topics Provided Infection: Methods: Explain/Verbal Responses: Reinforcements needed, State content correctly Venous: Methods: Explain/Verbal Responses: Reinforcements needed, State content correctly Electronic Signature(s) Signed: 07/20/2021 6:32:11 PM By: Baruch Gouty RN,  BSN Entered By: Baruch Gouty on 07/20/2021 15:31:25 -------------------------------------------------------------------------------- Wound Assessment Details Patient Name: Date of Service: [name]. 07/20/2021 2:15 PM Medical Record Number: [id number] Patient Account Number: [id number] Date of Birth/Sex: Treating RN: August 12, 1950 (71 y.o. [name] Primary Care Dvante Hands: [name]  Other Clinician: Referring Zeynep Fantroy: Treating Emiline Mancebo/Extender: Stormy Card in Treatment: 14 Wound Status Wound Number: 18 Primary Diabetic Wound/Ulcer of the Lower Extremity Etiology: Wound Location: Left, Plantar Metatarsal head first Wound Open Wounding Event: Gradually Appeared Status: Date Acquired: 08/23/2020 Comorbid Glaucoma, Sleep Apnea, Hypertension, Peripheral Arterial Disease, Weeks Of Treatment: 14 History: Peripheral Venous Disease, Type II Diabetes, Gout, Osteoarthritis, Clustered Wound: No Neuropathy Photos Wound Measurements Length: (cm) 1.5 Width: (cm) 2 Depth: (cm) 0.1 Area: (cm) 2.356 Volume: (cm) 0.236 % Reduction in Area: 83.3% % Reduction in Volume: 91.7% Epithelialization: Medium (34-66%) Tunneling: No Undermining: No Wound Description Classification: Grade 2 Wound Margin: Distinct, outline attached Exudate Amount: Medium Exudate Type: Serosanguineous Exudate Color: red, brown Foul Odor After Cleansing: No Slough/Fibrino No Wound Bed Granulation Amount: Large (67-100%) Exposed Structure Granulation Quality: Red Fascia Exposed: No Necrotic Amount: None Present (0%) Fat Layer (Subcutaneous Tissue) Exposed: Yes Tendon Exposed: No Muscle Exposed: No Joint Exposed: No Bone Exposed: No Electronic Signature(s) Signed: 07/20/2021 6:02:21 PM By: Deon Pilling RN, BSN Entered By: Deon Pilling on 07/20/2021 15:09:43 -------------------------------------------------------------------------------- Wound Assessment  Details Patient Name: Date of Service: [name]. 07/20/2021 2:15 PM Medical Record Number: [id number] Patient Account Number: [id number] Date of Birth/Sex: Treating RN: 10/20/1950 (71 y.o. [name], [name] Primary Care Berdena Cisek: [name] Other Clinician: Referring Lynnita Somma: Treating Desia Saban/Extender: Stormy Card in Treatment: 14 Wound Status Wound Number: 19 Primary Diabetic Wound/Ulcer of the Lower Extremity Etiology: Wound Location: Left, Anterior Lower Leg Wound Open Wounding Event: Gradually Appeared Status: Date Acquired: 08/23/2020 Comorbid Glaucoma, Sleep Apnea, Hypertension, Peripheral Arterial Disease, Weeks Of Treatment: 14 History: Peripheral Venous Disease, Type II Diabetes, Gout, Osteoarthritis, Clustered Wound: No Neuropathy Photos Wound Measurements Length: (cm) 0.2 Width: (cm) 0.2 Depth: (cm) 0.1 Area: (cm) 0.031 Volume: (cm) 0.003 % Reduction in Area: 99.8% % Reduction in Volume: 99.9% Epithelialization: Large (67-100%) Tunneling: No Undermining: No Wound Description Classification: Grade 2 Wound Margin: Distinct, outline attached Exudate Amount: Small Exudate Type: Serosanguineous Exudate Color: red, brown Foul Odor After Cleansing: No Slough/Fibrino No Wound Bed Granulation Amount: Large (67-100%) Exposed Structure Granulation Quality: Red Fascia Exposed: No Necrotic Amount: None Present (0%) Fat Layer (Subcutaneous Tissue) Exposed: Yes Tendon Exposed: No Muscle Exposed: No Joint Exposed: No Bone Exposed: No Electronic Signature(s) Signed: 07/20/2021 6:02:21 PM By: Deon Pilling RN, BSN Entered By: Deon Pilling on 07/20/2021 15:13:06 -------------------------------------------------------------------------------- Wound Assessment Details Patient Name: Date of Service: [name]. 07/20/2021 2:15 PM Medical Record Number: [id number] Patient Account Number: [id number] Date of Birth/Sex: Treating  RN: March 03, 1951 (71 y.o. [name], [name] Primary Care Courtez Twaddle: [name] Other Clinician: Referring Bradd Merlos: Treating Martina Brodbeck/Extender: Stormy Card in Treatment: 14 Wound Status Wound Number: 22 Primary Cyst Etiology: Wound Location: Left, Lateral Lower Leg Wound Open Wounding Event: Bump Status: Date Acquired: 06/03/2021 Comorbid Glaucoma, Sleep Apnea, Hypertension, Peripheral Arterial Disease, Weeks Of Treatment: 6 History: Peripheral Venous Disease, Type II Diabetes, Gout, Osteoarthritis, Clustered Wound: No Neuropathy Photos Wound Measurements Length: (cm) 3 Width: (cm) 2.2 Depth: (cm) 1 Area: (cm) 5.184 Volume: (cm) 5.184 % Reduction in Area: -214.4% % Reduction in Volume: -293% Epithelialization: Small (1-33%) Tunneling: Yes Position (o'clock): 12 Maximum Distance: (cm) 14 Undermining: No Wound Description Classification: Full Thickness With Exposed Support Structures Wound Margin: Distinct, outline attached Exudate Amount: Large Exudate Type: Purulent Exudate Color: yellow, brown, green Foul Odor After Cleansing: No Slough/Fibrino Yes Wound Bed Granulation Amount: Large (67-100%) Exposed Structure Granulation Quality: Red  Fascia Exposed: No Necrotic Amount: Small (1-33%) Fat Layer (Subcutaneous Tissue) Exposed: Yes Necrotic Quality: Adherent Slough Tendon Exposed: No Muscle Exposed: No Joint Exposed: No Bone Exposed: No Electronic Signature(s) Signed: 07/20/2021 6:02:21 PM By: Deon Pilling RN, BSN Entered By: Deon Pilling on 07/20/2021 15:15:52 -------------------------------------------------------------------------------- Wound Assessment Details Patient Name: Date of Service: [name]. 07/20/2021 2:15 PM Medical Record Number: [id number] Patient Account Number: [id number] Date of Birth/Sex: Treating RN: Oct 02, 1950 (71 y.o. [name], [name] Primary Care Huey Scalia: [name] Other Clinician: Referring  Ichael Pullara: Treating Ronia Hazelett/Extender: Stormy Card in Treatment: 14 Wound Status Wound Number: 23 Primary Cyst Etiology: Wound Location: Left, Lateral Upper Leg Wound Open Wounding Event: Laceration Status: Date Acquired: 06/05/2021 Comorbid Glaucoma, Sleep Apnea, Hypertension, Peripheral Arterial Disease, Weeks Of Treatment: 0 History: Peripheral Venous Disease, Type II Diabetes, Gout, Osteoarthritis, Clustered Wound: No Neuropathy Photos Wound Measurements Length: (cm) 0.3 Width: (cm) 0.9 Depth: (cm) 0.8 Area: (cm) 0.212 Volume: (cm) 0.17 % Reduction in Area: 0% % Reduction in Volume: 0% Epithelialization: None Tunneling: Yes Position (o'clock): 6 Maximum Distance: (cm) 4 Undermining: No Wound Description Classification: Full Thickness Without Exposed Support Structures Wound Margin: Distinct, outline attached Exudate Amount: Medium Exudate Type: Serosanguineous Exudate Color: red, brown Foul Odor After Cleansing: No Slough/Fibrino No Wound Bed Granulation Amount: Large (67-100%) Exposed Structure Granulation Quality: Red Fascia Exposed: No Necrotic Amount: None Present (0%) Fat Layer (Subcutaneous Tissue) Exposed: Yes Tendon Exposed: No Muscle Exposed: No Joint Exposed: No Bone Exposed: No Electronic Signature(s) Signed: 07/20/2021 6:02:21 PM By: Deon Pilling RN, BSN Entered By: Deon Pilling on 07/20/2021 15:18:08 -------------------------------------------------------------------------------- Vitals Details Patient Name: Date of Service: [name]. 07/20/2021 2:15 PM Medical Record Number: [id number] Patient Account Number: [id number] Date of Birth/Sex: Treating RN: 28-Apr-1951 (71 y.o. [name], [name] Primary Care Jakyron Fabro: [name] Other Clinician: Referring Devlyn Parish: Treating Darcey Demma/Extender: Stormy Card in Treatment: 14 Vital Signs Time Taken: 14:47 Temperature (F): 98.2 Height  (in): 74 Pulse (bpm): 76 Weight (lbs): 238 Respiratory Rate (breaths/min): 18 Body Mass Index (BMI): 30.6 Blood Pressure (mmHg): 184/76 Capillary Blood Glucose (mg/dl): 283 Reference Range: 80 - 120 mg / dl Electronic Signature(s) Signed: 07/20/2021 3:23:49 PM By: Sandre Kitty Entered By: Sandre Kitty on 07/20/2021 14:48:03

## 2021-07-24 ENCOUNTER — Other Ambulatory Visit: Payer: Self-pay

## 2021-07-24 ENCOUNTER — Encounter (HOSPITAL_BASED_OUTPATIENT_CLINIC_OR_DEPARTMENT_OTHER): Payer: Medicare Other | Admitting: Internal Medicine

## 2021-07-24 DIAGNOSIS — E11621 Type 2 diabetes mellitus with foot ulcer: Secondary | ICD-10-CM | POA: Diagnosis not present

## 2021-07-24 NOTE — Progress Notes (Signed)
COLBIE, DANNER ([id number]) Visit Report for 07/24/2021 Arrival Information Details Patient Name: Date of Service: [name], [name] 07/24/2021 8:45 A M Medical Record Number: [id number] Patient Account Number: [id number] Date of Birth/Sex: Treating RN: 09-Apr-1951 (71 y.o. [name] Primary Care Chenelle Benning: [name] Other Clinician: Referring Ayiana Winslett: Treating Jerusalem Brownstein/Extender: Cornelious Bryant in Treatment: 14 Visit Information History Since Last Visit Added or deleted any medications: No Patient Arrived: Ambulatory Any new allergies or adverse reactions: No Arrival Time: 08:53 Had a fall or experienced change in No Accompanied By: self activities of daily living that may affect Transfer Assistance: None risk of falls: Patient Requires Transmission-Based Precautions: No Signs or symptoms of abuse/neglect since last visito No Patient Has Alerts: Yes Hospitalized since last visit: No Patient Alerts: ABI's: 09/22 L:1.09 R: 1. Implantable device outside of the clinic excluding No TBI's: R:0.72 L:0.99 cellular tissue based products placed in the center since last visit: Has Dressing in Place as Prescribed: Yes Has Compression in Place as Prescribed: Yes Pain Present Now: No Electronic Signature(s) Signed: 07/24/2021 5:28:28 PM By: Dellie Catholic RN Entered By: Dellie Catholic on 07/24/2021 08:53:57 -------------------------------------------------------------------------------- Compression Therapy Details Patient Name: Date of Service: [name]. 07/24/2021 8:45 A M Medical Record Number: [id number] Patient Account Number: [id number] Date of Birth/Sex: Treating RN: March 09, 1951 (71 y.o. [name] Primary Care Romaine Maciolek: [name] Other Clinician: Referring Jamila Slatten: Treating Gwenn Teodoro/Extender: Cornelious Bryant in Treatment: 14 Compression Therapy Performed for Wound Assessment: Wound #18 Left,Plantar Metatarsal  head first Performed By: Clinician Dellie Catholic, RN Compression Type: Double Layer Electronic Signature(s) Signed: 07/24/2021 5:28:28 PM By: Dellie Catholic RN Entered By: Dellie Catholic on 07/24/2021 09:29:43 -------------------------------------------------------------------------------- Compression Therapy Details Patient Name: Date of Service: [name]. 07/24/2021 8:45 A M Medical Record Number: [id number] Patient Account Number: [id number] Date of Birth/Sex: Treating RN: 1950-09-07 (71 y.o. [name] Primary Care Leba Tibbitts: [name] Other Clinician: Referring Shlomie Romig: Treating Melanye Hiraldo/Extender: Cornelious Bryant in Treatment: 14 Compression Therapy Performed for Wound Assessment: Wound #19 Left,Anterior Lower Leg Performed By: Clinician Dellie Catholic, RN Compression Type: Double Layer Electronic Signature(s) Signed: 07/24/2021 5:28:28 PM By: Dellie Catholic RN Entered By: Dellie Catholic on 07/24/2021 09:29:43 -------------------------------------------------------------------------------- Compression Therapy Details Patient Name: Date of Service: [name]. 07/24/2021 8:45 A M Medical Record Number: [id number] Patient Account Number: [id number] Date of Birth/Sex: Treating RN: August 06, 1950 (71 y.o. [name] Primary Care Donovan Gatchel: [name] Other Clinician: Referring Chayce Rullo: Treating Loraine Bhullar/Extender: Cornelious Bryant in Treatment: 14 Compression Therapy Performed for Wound Assessment: Wound #22 Left,Lateral Lower Leg Performed By: Clinician Dellie Catholic, RN Compression Type: Double Layer Electronic Signature(s) Signed: 07/24/2021 5:28:28 PM By: Dellie Catholic RN Entered By: Dellie Catholic on 07/24/2021 09:29:43 -------------------------------------------------------------------------------- Compression Therapy Details Patient Name: Date of Service: [name]. 07/24/2021 8:45 A  M Medical Record Number: [id number] Patient Account Number: [id number] Date of Birth/Sex: Treating RN: Jun 10, 1951 (71 y.o. [name] Primary Care Vrinda Heckstall: [name] Other Clinician: Referring Dajah Fischman: Treating Oswin Griffith/Extender: Cornelious Bryant in Treatment: 14 Compression Therapy Performed for Wound Assessment: Wound #23 Left,Lateral Upper Leg Performed By: Clinician Dellie Catholic, RN Compression Type: Double Layer Electronic Signature(s) Signed: 07/24/2021 5:28:28 PM By: Dellie Catholic RN Entered By: Dellie Catholic on 07/24/2021 09:29:43 -------------------------------------------------------------------------------- Encounter Discharge Information Details Patient Name: Date of Service: [name]. 07/24/2021 8:45 A M Medical Record Number: [id number] Patient Account Number: [id number] Date  of Birth/Sex: Treating RN: [birth year] (71 y.o. [name] Primary Care Adael Culbreath: Other Clinician: [name] Referring Wava Kildow: Treating Donalee Gaumond/Extender: Cornelious Bryant in Treatment: 14 Encounter Discharge Information Items Discharge Condition: Stable Ambulatory Status: Ambulatory Discharge Destination: Home Transportation: Private Auto Accompanied By: self Schedule Follow-up Appointment: Yes Clinical Summary of Care: Patient Declined Electronic Signature(s) Signed: 07/24/2021 5:28:28 PM By: Dellie Catholic RN Entered By: Dellie Catholic on 07/24/2021 09:26:09 -------------------------------------------------------------------------------- Patient/Caregiver Education Details Patient Name: Date of Service: [name] 1/30/2023andnbsp8:45 A M Medical Record Number: [id number] Patient Account Number: [id number] Date of Birth/Gender: Treating RN: Nov 20, 1950 (71 y.o. [name] Primary Care Physician: [name] Other Clinician: Referring Physician: Treating Physician/Extender: Cornelious Bryant in Treatment: 14 Education Assessment Education Provided To: Patient Education Topics Provided Infection: Methods: Explain/Verbal Responses: Return demonstration correctly Electronic Signature(s) Signed: 07/24/2021 5:28:28 PM By: Dellie Catholic RN Entered By: Dellie Catholic on 07/24/2021 09:25:42 -------------------------------------------------------------------------------- Wound Assessment Details Patient Name: Date of Service: [name]. 07/24/2021 8:45 A M Medical Record Number: [id number] Patient Account Number: [id number] Date of Birth/Sex: Treating RN: [birth year] (71 y.o. [name] Primary Care Nickie Deren: [name] Other Clinician: Referring Lillyth Spong: Treating Stacia Feazell/Extender: Cornelious Bryant in Treatment: 14 Wound Status Wound Number: 18 Primary Diabetic Wound/Ulcer of the Lower Extremity Etiology: Wound Location: Left, Plantar Metatarsal head first Wound Open Wounding Event: Gradually Appeared Status: Date Acquired: 08/23/2020 Date Acquired: 08/23/2020 Comorbid Glaucoma, Sleep Apnea, Hypertension, Peripheral Arterial Disease, Weeks Of Treatment: 14 History: Peripheral Venous Disease, Type II Diabetes, Gout, Osteoarthritis, Clustered Wound: No Neuropathy Wound Measurements Length: (cm) 1.5 Width: (cm) 2 Depth: (cm) 0.1 Area: (cm) 2.356 Volume: (cm) 0.236 % Reduction in Area: 83.3% % Reduction in Volume: 91.7% Epithelialization: Medium (34-66%) Tunneling: No Undermining: No Wound Description Classification: Grade 2 Wound Margin: Distinct, outline attached Exudate Amount: Medium Exudate Type: Serosanguineous Exudate Color: red, brown Foul Odor After Cleansing: No Slough/Fibrino No Wound Bed Granulation Amount: Large (67-100%) Exposed Structure Granulation Quality: Red Fascia Exposed: No Necrotic Amount: None Present (0%) Fat Layer (Subcutaneous Tissue) Exposed: Yes Tendon Exposed:  No Muscle Exposed: No Joint Exposed: No Bone Exposed: No Treatment Notes Wound #18 (Metatarsal head first) Wound Laterality: Plantar, Left Cleanser Soap and Water Discharge Instruction: May shower and wash wound with dial antibacterial soap and water prior to dressing change. Wound Cleanser Discharge Instruction: Cleanse the wound with wound cleanser prior to applying a clean dressing using gauze sponges, not tissue or cotton balls. Peri-Wound Care Triamcinolone 15 (g) Discharge Instruction: Use triamcinolone 15 (g) as directed Zinc Oxide Ointment 30g tube Discharge Instruction: Apply Zinc Oxide to periwound with each dressing change Sween Lotion (Moisturizing lotion) Discharge Instruction: Apply moisturizing lotion as directed Topical Primary Dressing Hydrofera Blue Classic Foam, 4x4 in Discharge Instruction: Moisten with saline prior to applying to wound bed Secondary Dressing ABD Pad, 5x9 Discharge Instruction: Apply over primary dressing as directed. Zetuvit Plus 4x8 in Discharge Instruction: Apply over primary dressing as directed. CarboFLEX Odor Control Dressing, 4x4 in Discharge Instruction: Apply over primary dressing as directed. Secured With Compression Wrap CoFlex TLC XL 2-layer Compression System 4x7 (in/yd) Discharge Instruction: Apply CoFlex 2-layer compression as directed. (alt for 4 layer) Compression Stockings Add-Ons Electronic Signature(s) Signed: 07/24/2021 5:28:28 PM By: Dellie Catholic RN Entered By: Dellie Catholic on 07/24/2021 08:55:38 -------------------------------------------------------------------------------- Wound Assessment Details Patient Name: Date of Service: [name]. 07/24/2021 8:45 A M Medical Record Number: [id number] Patient Account Number: [id number] Date  of Birth/Sex: Treating RN: 1950-09-05 (71 y.o. [name] Primary Care Tricia Pledger: [name] Other Clinician: Referring Rumi Kolodziej: Treating Taiylor Virden/Extender:  Cornelious Bryant in Treatment: 14 Wound Status Wound Number: 19 Primary Diabetic Wound/Ulcer of the Lower Extremity Etiology: Wound Location: Left, Anterior Lower Leg Wound Open Wounding Event: Gradually Appeared Status: Date Acquired: 08/23/2020 Comorbid Glaucoma, Sleep Apnea, Hypertension, Peripheral Arterial Disease, Weeks Of Treatment: 14 History: Peripheral Venous Disease, Type II Diabetes, Gout, Osteoarthritis, Clustered Wound: No Neuropathy Wound Measurements Length: (cm) 0.2 Width: (cm) 0.2 Depth: (cm) 0.1 Area: (cm) 0.031 Volume: (cm) 0.003 % Reduction in Area: 99.8% % Reduction in Volume: 99.9% Epithelialization: Large (67-100%) Tunneling: No Undermining: No Wound Description Classification: Grade 2 Wound Margin: Distinct, outline attached Exudate Amount: Small Exudate Type: Serosanguineous Exudate Color: red, brown Foul Odor After Cleansing: No Slough/Fibrino No Wound Bed Granulation Amount: Large (67-100%) Exposed Structure Granulation Quality: Red Fascia Exposed: No Necrotic Amount: None Present (0%) Fat Layer (Subcutaneous Tissue) Exposed: Yes Tendon Exposed: No Muscle Exposed: No Joint Exposed: No Bone Exposed: No Treatment Notes Wound #19 (Lower Leg) Wound Laterality: Left, Anterior Cleanser Soap and Water Discharge Instruction: May shower and wash wound with dial antibacterial soap and water prior to dressing change. Wound Cleanser Discharge Instruction: Cleanse the wound with wound cleanser prior to applying a clean dressing using gauze sponges, not tissue or cotton balls. Peri-Wound Care Triamcinolone 15 (g) Discharge Instruction: Use triamcinolone 15 (g) as directed Zinc Oxide Ointment 30g tube Discharge Instruction: Apply Zinc Oxide to periwound with each dressing change Sween Lotion (Moisturizing lotion) Discharge Instruction: Apply moisturizing lotion as directed Topical Primary Dressing Hydrofera Blue Classic  Foam, 4x4 in Discharge Instruction: Moisten with saline prior to applying to wound bed Secondary Dressing ABD Pad, 5x9 Discharge Instruction: Apply over primary dressing as directed. Zetuvit Plus 4x8 in Discharge Instruction: Apply over primary dressing as directed. CarboFLEX Odor Control Dressing, 4x4 in Discharge Instruction: Apply over primary dressing as directed. Secured With Compression Wrap CoFlex TLC XL 2-layer Compression System 4x7 (in/yd) Discharge Instruction: Apply CoFlex 2-layer compression as directed. (alt for 4 layer) Compression Stockings Add-Ons Electronic Signature(s) Signed: 07/24/2021 5:28:28 PM By: Dellie Catholic RN Entered By: Dellie Catholic on 07/24/2021 08:56:12 -------------------------------------------------------------------------------- Wound Assessment Details Patient Name: Date of Service: [name]. 07/24/2021 8:45 A M Medical Record Number: [id number] Patient Account Number: [id number] Date of Birth/Sex: Treating RN: [birth year] (71 y.o. [name] Primary Care Lonny Eisen: [name] Other Clinician: Referring Katelyne Galster: Treating Mahki Spikes/Extender: Cornelious Bryant in Treatment: 14 Wound Status Wound Number: 22 Primary Cyst Etiology: Wound Location: Left, Lateral Lower Leg Wound Open Wounding Event: Bump Status: Date Acquired: 06/03/2021 Comorbid Glaucoma, Sleep Apnea, Hypertension, Peripheral Arterial Disease, Weeks Of Treatment: 7 History: Peripheral Venous Disease, Type II Diabetes, Gout, Osteoarthritis, Clustered Wound: No Neuropathy Wound Measurements Length: (cm) 3 Width: (cm) 2.2 Depth: (cm) 1 Area: (cm) 5.184 Volume: (cm) 5.184 % Reduction in Area: -214.4% % Reduction in Volume: -293% Epithelialization: Small (1-33%) Tunneling: No Undermining: No Wound Description Classification: Full Thickness With Exposed Support Structures Wound Margin: Distinct, outline attached Exudate Amount:  Large Exudate Type: Purulent Exudate Color: yellow, brown, green Foul Odor After Cleansing: No Slough/Fibrino Yes Wound Bed Granulation Amount: Large (67-100%) Exposed Structure Granulation Quality: Red Fascia Exposed: No Necrotic Amount: Small (1-33%) Fat Layer (Subcutaneous Tissue) Exposed: Yes Necrotic Quality: Adherent Slough Tendon Exposed: No Muscle Exposed: No Joint Exposed: No Bone Exposed: No Treatment Notes Wound #22 (Lower Leg) Wound Laterality: Left,  Lateral Cleanser Soap and Water Discharge Instruction: May shower and wash wound with dial antibacterial soap and water prior to dressing change. Wound Cleanser Discharge Instruction: Cleanse the wound with wound cleanser prior to applying a clean dressing using gauze sponges, not tissue or cotton balls. Peri-Wound Care Triamcinolone 15 (g) Discharge Instruction: Use triamcinolone 15 (g) as directed Zinc Oxide Ointment 30g tube Discharge Instruction: Apply Zinc Oxide to periwound with each dressing change Sween Lotion (Moisturizing lotion) Discharge Instruction: Apply moisturizing lotion as directed Topical Primary Dressing KerraCel Ag Gelling Fiber Dressing, 4x5 in (silver alginate) Discharge Instruction: Apply silver alginate to wound bed as instructed Secondary Dressing ABD Pad, 5x9 Discharge Instruction: Apply over primary dressing as directed. Zetuvit Plus 4x8 in Discharge Instruction: Apply over primary dressing as directed. CarboFLEX Odor Control Dressing, 4x4 in Discharge Instruction: Apply over primary dressing as directed. Secured With Compression Wrap CoFlex TLC XL 2-layer Compression System 4x7 (in/yd) Discharge Instruction: Apply CoFlex 2-layer compression as directed. (alt for 4 layer) Compression Stockings Add-Ons Electronic Signature(s) Signed: 07/24/2021 5:28:28 PM By: Dellie Catholic RN Entered By: Dellie Catholic on 07/24/2021  08:57:04 -------------------------------------------------------------------------------- Wound Assessment Details Patient Name: Date of Service: [name]. 07/24/2021 8:45 A M Medical Record Number: [id number] Patient Account Number: [id number] Date of Birth/Sex: Treating RN: 04-24-1951 (70 y.o. [name] Primary Care Annistyn Depass: [name] Other Clinician: Referring Benigno Check: Treating Thelonious Kauffmann/Extender: Cornelious Bryant in Treatment: 14 Wound Status Wound Number: 23 Primary Cyst Etiology: Wound Location: Left, Lateral Upper Leg Wound Open Wounding Event: Laceration Status: Date Acquired: 06/05/2021 Comorbid Glaucoma, Sleep Apnea, Hypertension, Peripheral Arterial Disease, Weeks Of Treatment: 0 History: Peripheral Venous Disease, Type II Diabetes, Gout, Osteoarthritis, Clustered Wound: No Neuropathy Wound Measurements Length: (cm) 0.3 Width: (cm) 0.9 Depth: (cm) 0.8 Area: (cm) 0.212 Volume: (cm) 0.17 % Reduction in Area: 0% % Reduction in Volume: 0% Epithelialization: None Tunneling: Yes Position (o'clock): 6 Maximum Distance: (cm) 4 Wound Description Classification: Full Thickness Without Exposed Support Structu Wound Margin: Distinct, outline attached Exudate Amount: Medium Exudate Type: Serosanguineous Exudate Color: red, brown res Foul Odor After Cleansing: No Slough/Fibrino No Wound Bed Granulation Amount: Large (67-100%) Exposed Structure Granulation Quality: Red Fascia Exposed: No Necrotic Amount: None Present (0%) Fat Layer (Subcutaneous Tissue) Exposed: Yes Tendon Exposed: No Muscle Exposed: No Joint Exposed: No Bone Exposed: No Treatment Notes Wound #23 (Upper Leg) Wound Laterality: Left, Lateral Cleanser Peri-Wound Care Triamcinolone 15 (g) Discharge Instruction: Use triamcinolone 15 (g) as directed Sween Lotion (Moisturizing lotion) Discharge Instruction: Apply moisturizing lotion as  directed Topical Primary Dressing KerraCel Ag Gelling Fiber Dressing, 4x5 in (silver alginate) Discharge Instruction: Apply silver alginate to wound bed as instructed Secondary Dressing Zetuvit Plus Silicone Border Dressing 7x7(in/in) Discharge Instruction: Apply silicone border over primary dressing as directed. Secured With Elastic Bandage 4 inch (ACE bandage) Discharge Instruction: Secure with ACE bandage as directed. Compression Wrap Compression Stockings Add-Ons Electronic Signature(s) Signed: 07/24/2021 5:28:28 PM By: Dellie Catholic RN Entered By: Dellie Catholic on 07/24/2021 08:58:14 -------------------------------------------------------------------------------- Vitals Details Patient Name: Date of Service: [name]. 07/24/2021 8:45 A M Medical Record Number: [id number] Patient Account Number: [id number] Date of Birth/Sex: Treating RN: 04/11/1951 (71 y.o. [name] Primary Care Arvine Clayburn: [name] Other Clinician: Referring Kailen Hinkle: Treating Andrea Ferrer/Extender: Cornelious Bryant in Treatment: 14 Vital Signs Time Taken: 08:54 Temperature (F): 98 Height (in): 74 Pulse (bpm): 72 Weight (lbs): 238 Respiratory Rate (breaths/min): 20 Body Mass Index (BMI): 30.6 Blood Pressure (mmHg): 177/75  Reference Range: 80 - 120 mg / dl Electronic Signature(s) Signed: 07/24/2021 5:28:28 PM By: Dellie Catholic RN Entered By: Dellie Catholic on 07/24/2021 08:54:28

## 2021-07-24 NOTE — Progress Notes (Signed)
JABEZ, MOLNER ([id number]) Visit Report for 07/24/2021 SuperBill Details Patient Name: Date of Service: MOHSEN, ODENTHAL 07/24/2021 Medical Record Number: [id number] Patient Account Number: [id number] Date of Birth/Sex: Treating RN: 1950-07-05 (71 y.o. 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

## 2021-07-25 ENCOUNTER — Other Ambulatory Visit: Payer: Self-pay | Admitting: *Deleted

## 2021-07-25 DIAGNOSIS — I739 Peripheral vascular disease, unspecified: Secondary | ICD-10-CM

## 2021-07-25 DIAGNOSIS — I779 Disorder of arteries and arterioles, unspecified: Secondary | ICD-10-CM

## 2021-07-27 ENCOUNTER — Other Ambulatory Visit: Payer: Self-pay

## 2021-07-27 ENCOUNTER — Encounter (HOSPITAL_BASED_OUTPATIENT_CLINIC_OR_DEPARTMENT_OTHER): Payer: Medicare Other | Attending: Internal Medicine | Admitting: Internal Medicine

## 2021-07-27 DIAGNOSIS — I129 Hypertensive chronic kidney disease with stage 1 through stage 4 chronic kidney disease, or unspecified chronic kidney disease: Secondary | ICD-10-CM | POA: Insufficient documentation

## 2021-07-27 DIAGNOSIS — L97828 Non-pressure chronic ulcer of other part of left lower leg with other specified severity: Secondary | ICD-10-CM | POA: Insufficient documentation

## 2021-07-27 DIAGNOSIS — E1151 Type 2 diabetes mellitus with diabetic peripheral angiopathy without gangrene: Secondary | ICD-10-CM | POA: Insufficient documentation

## 2021-07-27 DIAGNOSIS — L02416 Cutaneous abscess of left lower limb: Secondary | ICD-10-CM | POA: Diagnosis not present

## 2021-07-27 DIAGNOSIS — I890 Lymphedema, not elsewhere classified: Secondary | ICD-10-CM | POA: Diagnosis not present

## 2021-07-27 DIAGNOSIS — L97528 Non-pressure chronic ulcer of other part of left foot with other specified severity: Secondary | ICD-10-CM | POA: Diagnosis not present

## 2021-07-27 DIAGNOSIS — E1142 Type 2 diabetes mellitus with diabetic polyneuropathy: Secondary | ICD-10-CM | POA: Insufficient documentation

## 2021-07-27 DIAGNOSIS — E11621 Type 2 diabetes mellitus with foot ulcer: Secondary | ICD-10-CM | POA: Diagnosis not present

## 2021-07-27 DIAGNOSIS — L97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: Secondary | ICD-10-CM | POA: Insufficient documentation

## 2021-07-27 DIAGNOSIS — N183 Chronic kidney disease, stage 3 unspecified: Secondary | ICD-10-CM | POA: Diagnosis not present

## 2021-07-27 DIAGNOSIS — I87322 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: Secondary | ICD-10-CM | POA: Diagnosis not present

## 2021-07-27 DIAGNOSIS — I872 Venous insufficiency (chronic) (peripheral): Secondary | ICD-10-CM | POA: Diagnosis not present

## 2021-07-27 NOTE — Progress Notes (Signed)
[name], [name] ([id number]) Visit Report for 07/27/2021 HPI Details Patient Name: Date of Service: [name], [name] 07/27/2021 2:45 PM Medical Record Number: [id number] Patient Account Number: [id number] Date of Birth/Sex: Treating RN: October 22, 1950 (71 y.o. [name] Primary Care Provider: [name] Other Clinician: Referring Provider: Treating Provider/Extender: Stormy Card in Treatment: 15 History of Present Illness HPI Description: 10/11/17; [name] is a 71 year old man who tells me that in 2015 he slipped down the latter traumatizing his left leg. He developed a wound in the same spot the area that we are currently looking at. He states this closed over for the most part although he always felt it was somewhat unstable. In 2016 he hit the same area with the door of his car had this reopened. He tells me that this is never really closed although sometimes an inflow it remains open on a constant basis. He has not been using any specific dressing to this except for topical antibiotics the nature of which were not really sure. His primary doctor did send him to see Dr. [name] of interventional cardiology. He underwent an angiogram on 08/06/17 and he underwent a PTA and directional atherectomy of the lesser distal SFA and popliteal arteries which resulted in brisk improvement in blood flow. It was noted that he had 2 vessel runoff through the anterior tibial and peroneal. He is also been to see vascular and interventional radiologist. He was not felt to have any significant superficial venous insufficiency. Presumably is not a candidate for any ablation. It was suggested he come here for wound care. The patient is a type II diabetic on insulin. He also has a history of venous insufficiency. ABIs on the left were noncompressible in our clinic 10/21/17; patient we admitted to the clinic last week. 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 We put Iodosorb on him after an aggressive debridement and 3 layer compression. He complained of pain in his ankle and itching with is skin in fact he scratched the area on the medial calf superiorly at the rim of our wraps and he has 2 small open areas in that location today which are new. I changed his primary dressing today to silver collagen. As noted he is already had revascularization and does not have any significant superficial venous insufficiency that would be amenable to ablation 10/28/17; patient admitted to the clinic 2 weeks ago. He has a smaller Wound. Scratch injury from last week revealed. There is large wound over the tibial area. This is smaller. Granulation looks healthy. No need for debridement. 11/04/17; the wound on the left lateral calf looks better. Improved dimensions. Surface of this looks better. We've been maintaining him and Kerlix Coban wraps. He finds this much more comfortable. Silver collagen dressing 11/11/17; left lateral Wound continues to look healthy be making progress. Using a #5 curet I removed removed nonviable skin from the surface of the wound and then necrotic debris from the wound surface. Surface of the wound continues to look healthy. He also has an open area on the left great toenail bed. We've been using topical antibiotics. 11/19/17; left anterior lateral wound continues to look healthy but it's not closed. He also had a small wound above this on the left leg Initially traumatic wounds in the setting of significant chronic venous insufficiency and stasis dermatitis 11/25/17; left anterior wounds superiorly is closed still a small wound inferiorly. 12/02/17;  left anterior tibial area. Arrives today with adherent callus. Post debridement clearly not completely closed. Hydrofera Blue under 3 layer compression. 12/09/17; left anterior tibia. Circumferential eschar however the wound bed looks stable to improved. We've been  using Hydrofera Blue under 3 layer compression 12/17/17; left anterior tibia. Apparently this was felt to be closed however when the wrap was taken off there is a skin tear to reopen wounds in the same area we've been using Hydrofera Blue under 3 layer compression 12/23/17 left anterior tibia. Not close to close this week apparently the [facility] was stuck to this again. Still circumferential eschar requiring debridement. I put a contact layer on this this time under the Hydrofera Blue 12/31/17; left anterior tibia. Wound is better slight amount of hyper-granulation. Using Hydrofera Blue over Adaptic. 01/07/18; left anterior tibia. The wound had some surface eschar however after this was removed he has no open wound.he was already revascularized by Dr. [name] when he came to our clinic with atherectomy of the left SFA and popliteal artery. He was also sent to interventional radiology for venous reflux studies. He was not felt to have significant reflux but certainly has chronic venous changes of his skin with hemosiderin deposition around this area. He will definitely need to lubricate his skin and wear compression stocking and I've talked to him about this. READMISSION 05/26/2018 This is a now 71 year old man we cared for with traumatic wounds on his left anterior lower extremity. He had been previously revascularized during that admission by Dr. [name]. Apparently in follow-up Dr. [name] noted that he had deterioration in his arterial status. He underwent a stent placement in the distal left SFA on 04/22/2018. 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 He went back to the angiography suite on 04/30/2018 he underwent PTA and balloon angioplasty of the occluded left mid anterior tibial artery, thrombotic occlusion went from 100 to 0% which reconstitutes the posterior tibial artery. He had thrombectomy and aspiration of the peroneal artery. The stent placed in the distal SFA left SFA was  still occluded. 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 He has been recuperating at [place] home in [facility] after the most recent hospitalization. He is going home tomorrow. He tells me that 2 weeks ago he traumatized the tip of his left fifth toe. He came in urgently for our review of this. This was a history of before I noted that Dr. [name] had already noted dry gangrenous changes of the left fifth toe 06/09/2018; 2-week follow-up. I did contact Dr. [name] after his last appointment and he apparently saw 1 of Dr. Irven Shelling colleagues the next day. He does not follow-up with Dr. [name] himself until Thursday of this week. 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 Nevertheless there is no evidence of infection no drainage and no pain. He had a new area that this week when we were signing him in today on the left anterior mid tibia area, this is in close proximity to the previous wound we have dealt with in this clinic. 06/23/2018; 2-week follow-up. I did not receive a recent note from Dr. [name] to review today. Our office is trying to obtain this. He is apparently  not planning to do further vascular interventions and wondered about compression to try and help with the patient's chronic venous insufficiency. However we are also concerned about the arterial flow. He arrives in clinic today with a new area on the left third toe. The areas on the calf/anterior tibia are close to closing. The left fifth toe is still mummified using Betadine. -In reviewing things with the patient he has what sounds like claudication with mild to moderate amount of activity. 06/27/2018; x-ray of his foot suggested osteomyelitis of the left third  toe. I prescribed Levaquin over the phone while we attempted to arrange a plan of care. However the patient called yesterday to report he had low-grade fever and he came in today acutely. There is been a marked deterioration in the left third toe with spreading cellulitis up into the dorsal left foot. He was referred to the emergency room. Readmission: 06/29/2020 patient presents today for reevaluation here in our clinic he was previously treated by Dr. Dellia Nims at the latter part of 2019 in 2 the beginning of 2020. Subsequently we have not seen him since that time in the interim he did have evaluation with vein and vascular specialist specifically Dr. Anice Paganini who did perform quite extensive work for a left femoral to anterior tibial artery bypass. With that being said in the interim the patient has developed significant lymphedema and has wounds that he tells me have really never healed in regard to the incision site on the left leg. He also has multiple wounds on the feet for various reasons some of which is that he tends to pick at his feet. Fortunately there is no signs of active infection systemically at this time he does have some wounds that are little bit deeper but most are fairly superficial he seems to have good blood flow and overall everything appears to be healthy I see no bone exposed and no obvious signs of osteomyelitis. I do not know that he necessarily needs a x-ray at this point although that something we could consider depending on how things progress. The patient does have a history of lymphedema, diabetes, this is type II, chronic kidney disease stage III, hypertension, and history of peripheral vascular disease. 07/05/2020; patient admitted last week. Is a patient I remember from 2019 he had a spreading infection involving the left foot and we sent him to the hospital. 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 He subsequently had a left  femoral to anterior tibial bypass by Dr.Cain vein and vascular. He also has severe lymphedema with chronic skin changes related to that on the left leg. The most problematic area that was new today was on the left medial great toe. This was apparently a small area last week there was purulent drainage which our intake nurse cultured. Also areas on the left medial foot and heel left lateral foot. He has 2 areas on the left medial calf left lateral calf in the setting of the severe lymphedema. 07/13/2020 on evaluation today patient appears to be doing better in my opinion compared to his last visit. The good news is there is no signs of active infection systemically and locally I do not see any signs of infection either. He did have an x-ray which was negative that is great news he had a culture which showed MRSA but at the same time he is been on the doxycycline which has helped. I do think we may  want to extend this for 7 additional days 1/25; patient admitted to the clinic a few weeks ago. 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 We have been putting him under compression his edema control is a lot better but he is severe verricused skin on the left leg. He is really done quite well he still has an open area on the left medial calf and the left medial first metatarsal head. We have been using silver collagen on the leg silver alginate on the foot 07/27/2020 upon evaluation today patient appears to be doing decently well in regard to his wounds. He still has a lot of dry skin on the left leg. Some of this is starting to peel back and I think he may be able to have them out by removing some that today. Fortunately there is no signs of active infection at this time on the left leg although on the right leg he does appear to have swelling and erythema as well as some mild warmth to touch. This does have been concerned about the possibility of cellulitis although  within the differential diagnosis I do think that potentially a DVT has to be at least considered. We need to rule that out before proceeding would just call in the cellulitis. Especially since he is having pain in the posterior aspect of his calf muscle. 2/8; the patient had seen sparingly. He has severe skin changes of chronic lymphedema in the left leg thickened hyperkeratotic verrucous skin. He has an open wound on the medial part of the left first met head left mid tibia. He also has a rim of nonepithelialized skin in the anterior mid tibia. He brought in the AmLactin lotion that was been prescribed although I am not sure under compression and its utility. There concern about cellulitis on the right lower leg the last time he was here. He was put on on antibiotics. His DVT rule out was negative. The right leg looks fine he is using his stocking on this area 08/10/2020 upon evaluation today patient appears to be doing well with regard to his leg currently. He has been tolerating the dressing changes without complication. Fortunately there is no signs of active infection which is great news. Overall very pleased with where things stand. 2/22; the patient still has an area on the medial part of the left first met his head. This looks better than when I last saw this earlier this month he has a rim of epithelialization but still some surface debris. Mostly everything on the left leg is healed. There is still a vulnerable in the left mid tibia area. 08/30/2020 upon evaluation today patient appears to be doing much better in regard to his wounds on his foot. 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 Nonetheless I think we will address that today I Minna send in a prescription for him in that regard. Overall though there does not appear to be any signs of significant  worsening. 09/07/2020 on evaluation today patient's wounds over his left foot appear to be doing excellent. I do not see any signs of infection there is some callus buildup this can require debridement for certain but overall I feel like he is managing quite nicely. He still using the AmLactin cream which has been beneficial for him as well. 3/22; left foot  wound is closed. There is no open area here. He is using ammonium lactate lotion to the lower extremities to help exfoliate dry cracked skin. He has compression stockings from elastic therapy in [place]. The wound on the medial part of his left first met head is healed today. READMISSION 04/12/2021 Mr. Gasaway is a patient we know fairly well he had a prolonged stay in clinic in 2019 with wounds on his left lateral and left anterior lower extremity in the setting of chronic venous insufficiency. More recently he was here earlier this year with predominantly an area on his left foot first metatarsal head plantar and he says the plantar foot broke down on its not long after we discharged him but he did not come back here. The last few months areas of broken down on his left anterior and again the left lateral lower extremity. The leg itself is very swollen chronically enlarged a lot of hyperkeratotic dry Berry Q skin in the left lower leg. His edema extends well into the thigh. He was seen by Dr. Donzetta Matters. He had ABIs on 03/02/2021 showing an ABI on the right of 1 with a TBI of 0.72 his ABI in the left at 1.09 TBI of 0.99. Monophasic and biphasic waveforms on the right. On the left monophasic waveforms were noted he went on to have an angiogram on 03/27/2021 this showed the aortic aortic and iliac segments were free of flow-limiting stenosis the left common femoral vein to evaluate the left femoral to anterior tibial artery bypass was unobstructed the bypass was patent without any areas of stenosis. We discharged the patient in bilateral juxta lite  stockings but very clearly that was not sufficient to control the swelling and maintain skin integrity. He is clearly going to need compression pumps. 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 This is fortunate because he is on his feet for long periods of time. 10/27; patient comes in with our intake nurse reporting copious amount of green drainage from the left anterior mid tibia the left dorsal foot and to a lesser extent the left medial mid tibia. We left the compression wrap on all week for the amount of edema in his left leg is quite a bit better. We use silver alginate as the primary dressing 11/3; edema control is good. Left anterior lower leg left medial lower leg and the plantar first metatarsal head. The left anterior lower leg required debridement. Deep tissue culture I did of this wound showed MRSA I put him on 10 days of doxycycline which she will start today. We have him in compression wraps. He has a security card and [employer] however he is retiring on November 15. We will need to then get him into a better offloading boot for the left foot perhaps a total contact cast 11/10; edema control is quite good. Left anterior and left medial lower leg wounds in the setting of chronic venous insufficiency and lymphedema. He also has a substantial area over the left plantar first metatarsal head. 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 He has significant hypergranulation on the left plantar foot wound. The patient is a diabetic but he does not have significant PAD 11/17; edema control is quite good. Left anterior and left medial lower leg wounds look better. The really concerning area remains the area  on the left plantar first metatarsal head. He has a rim of epithelialization. He has been using a surgical shoe The patient is now retired from a a  [employer] I have gone over with him the need to offload this area aggressively. Starting today with a forefoot off loader but . possibly a total contact cast. 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 Arrives with a very significant odor from I think all of the wounds on the left leg and the left foot. Because of this I did not put a total contact cast on him today but will could still consider this. His wife was having cataract surgery which is the reason he missed the appointment 12/6. I saw this man 5 days ago with a swelling below the popliteal fossa. I thought he actually might have a Baker's cyst however the DVT rule out study that we could arrange right away was negative the technician told me this was not a ruptured Baker's cyst. We attempted to get this aspirated by under ultrasound guidance in interventional radiology however all they did was an ultrasound however it shows an extensive fluid collection 62 x 8 x 9.4 in the left thigh and left calf. The patient states he thinks this started 8 days ago or so but he really is not complaining of any pain, fever or systemic symptoms. He has not ha 12/20; after some difficulty I managed to get the patient into see Dr. Donzetta Matters. Eventually he was taken into the hospital and had a drain put in the fluid collection below his left knee posteriorly extending into the posterior thigh. He still has the drain in place. Culture of this showed moderate staff aureus few Morganella and few Klebsiella he is now on doxycycline and ciprofloxacin as suggested by infectious disease he is on this for a month. The drain will remain in place until it stops draining 12/29; he comes in today with the 1 wound on his left leg and the area on the left plantar first met head significantly smaller. Both look healthy. He still has the drain in the left leg. He says  he has to change this daily. Follows up with Dr. Donzetta Matters on January 11. 06/29/2021; the wounds that I am following on the left leg and left first met head continued to be quite healthy. However the area where his inferior drain is in place had copious amounts of drainage which was green in color. The wound here is larger. Follows up with Dr. Gwenlyn Saran of vein and vascular his surgeon next week as well as infectious disease. He remains on ciprofloxacin and doxycycline. He is not complaining of excessive pain in either one of the drain areas 1/12; the patient saw vascular surgery and infectious disease. Vascular surgery has left the drain in place as there was still some notable drainage still see him back in 2 weeks. Dr. Velna Ochs stop the doxycycline and ciprofloxacin and I do not believe he follows up with them at this point. Culture I did last week showed both doxycycline resistant MRSA and Pseudomonas not sensitive to ciprofloxacin although only in rare titers 1/19; the patient's wound on the left anterior lower leg is just about healed. We have continued healing of the area that was medially on the left leg. Left first plantar metatarsal head continues to get smaller. The major problem here  is his 2 drain sites 1 on the left upper calf and lateral thigh. There is purulent drainage still from the left lateral thigh. I gave him antibiotics last week but we still have recultured. He has the drain in the area I think this is eventually going to have to come out. I suspect there will be a connecting wound to heal here perhaps with improved VAc 1/26; the patient had his drain removed by vein and vascular on 1/25/. This was a large pocket of fluid in his left thigh that seem to tunnel into his left upper calf. 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 His mention his Penrose drain was removed today. He now has a tunneling wound on his left calf and left thigh. Both of these probe widely towards each  other although I cannot really prove that they connect. Both wounds on his lower leg anteriorly are closed and his area over the first metatarsal head on his right foot continues to improve. We are using Hydrofera Blue here. He also saw infectious disease culture of the abscess they noted was polymicrobial with MRSA, Morganella and Klebsiella he was treated with doxycycline and ciprofloxacin for 4 weeks ending on 07/03/2021. They did not recommend any further antibiotics. Notable that while he still had the Penrose drain in place last week he had purulent drainage coming out of the inferior IandD site this grew [facility], MRSA and Pseudomonas but there does not appear to be any active infection in this area today with the drain out and he is not systemically unwell 2/2; with regards to the drain sites the superior one on the thigh actually is closed down the one on the upper left lateral calf measures about 8 and half centimeters which is an improvement seems to be less prominent although still with a lot of drainage. The only remaining wound is over the first metatarsal head on the left foot and this looks to be continuing to improve with Hydrofera Blue. Electronic Signature(s) Signed: 07/27/2021 4:50:25 PM By: Linton Ham MD Entered By: Linton Ham on 07/27/2021 14:55:05 -------------------------------------------------------------------------------- Physical Exam Details Patient Name: Date of Service: [name] 07/27/2021 2:45 PM Medical Record Number: [id number] Patient Account Number: [id number] Date of Birth/Sex: Treating RN: 1951/04/09 (71 y.o. [name] Primary Care Provider: [name] Other Clinician: Referring Provider: Treating Provider/Extender: Stormy Card in Treatment: 23 Constitutional Patient is hypertensive.. Pulse regular and within target range for patient.Marland Kitchen Respirations regular, non-labored and within target range..  Temperature is normal and within the target range for the patient.Marland Kitchen Appears in no distress. Notes Wound exam; left anterior lower leg wound is closed and as is the tibial wound. He still has a clean smaller area on the plantar first metatarsal head. The major wound now is the drain site on the left lateral calf upper aspect. This goes in a point 5 cm. Still a lot of drainage. We will VAC that today with gauze will come back in our clinic to change this. I do not feel any increased swelling in the thigh there is no obvious return of the fluid collection which I think was probably a seroma that got secondarily infected Electronic Signature(s) Signed: 07/27/2021 4:50:25 PM By: Linton Ham MD Entered By: Linton Ham on 07/27/2021 14:56:38 -------------------------------------------------------------------------------- Physician Orders Details Patient Name: Date of Service: [name]. 07/27/2021 2:45 PM Medical Record Number: [id number] Patient Account Number: [id number] Date of Birth/Sex: Treating RN: [birth year] (  71 y.o. [name] Primary Care Provider: [name] Other Clinician: Referring Provider: Treating Provider/Extender: Stormy Card in Treatment: 15 Verbal / Phone Orders: No Diagnosis Coding ICD-10 Coding Code Description E11.621 Type 2 diabetes mellitus with foot ulcer 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 I89.0 Lymphedema, not elsewhere classified I87.322 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verity 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 Dellia Nims on Thursday with [name] Return  A Nurse Visit: - Monday for [facility] change Bathing/ Shower/ Hygiene May shower with protection but do not get wound dressing(s) wet. - Use a cast protector so you can shower without getting your wrap(s) wet Negative Presssure Wound Therapy Wound #22 Left,Lateral Lower Leg Wound Vac to wound continuously at 141mm/hg pressure - change 2 times per week in clinic Black Foam Sterile Gauze Packing - in tunnel Edema Control - Lymphedema / SCD / Other Elevate legs to the level of the heart or above for 30 minutes daily and/or when sitting, a frequency of: - throughout the day Avoid standing for long periods of time. Patient to wear own compression stockings every day. - on right leg; Moisturize legs daily. - Ammonium LACTATE to BLE every day. Off-Loading Wedge shoe to: - left front foot offloader Wound Treatment Wound #18 - Metatarsal head first Wound Laterality: Plantar, Left Cleanser: Soap and Water 2 x Per Week/7 Days Discharge Instructions: May shower and wash wound with dial antibacterial soap and water prior to dressing change. Cleanser: Wound Cleanser 2 x Per Week/7 Days Discharge Instructions: Cleanse the wound with wound cleanser prior to applying a clean dressing using gauze sponges, not tissue or cotton balls. Peri-Wound Care: Triamcinolone 15 (g) 2 x Per Week/7 Days Discharge Instructions: Use triamcinolone 15 (g) as directed Peri-Wound Care: Sween Lotion (Moisturizing lotion) 2 x Per Week/7 Days Discharge Instructions: Apply moisturizing lotion as directed Prim Dressing: Hydrofera Blue Ready Foam, 2.5 x2.5 in 2 x Per Week/7 Days ary Discharge Instructions: Apply to wound bed as instructed Secondary Dressing: ABD Pad, 5x9 2 x Per Week/7 Days Discharge Instructions: Apply over primary dressing as directed. Compression Wrap: CoFlex TLC XL 2-layer Compression System 4x7 (in/yd) 2 x Per Week/7 Days Discharge Instructions: Apply CoFlex 2-layer compression as directed. (alt for 4  layer) Wound #19 - Lower Leg Wound Laterality: Left, Anterior Cleanser: Soap and Water 2 x Per Week/7 Days Discharge Instructions: May shower and wash wound with dial antibacterial soap and water prior to dressing change. Cleanser: Wound Cleanser 2 x Per Week/7 Days Discharge Instructions: Cleanse the wound with wound cleanser prior to applying a clean dressing using gauze sponges, not tissue or cotton balls. Peri-Wound Care: Triamcinolone 15 (g) 2 x Per Week/7 Days Discharge Instructions: Use triamcinolone 15 (g) as directed Peri-Wound Care: Sween Lotion (Moisturizing lotion) 2 x Per Week/7 Days Discharge Instructions: Apply moisturizing lotion as directed Prim Dressing: Hydrofera Blue Ready Foam, 2.5 x2.5 in 2 x Per Week/7 Days ary Discharge Instructions: Apply to wound bed as instructed Secondary Dressing: Woven Gauze Sponge, Non-Sterile 4x4 in 2 x Per Week/7 Days Discharge Instructions: Apply over primary dressing as directed. Compression Wrap: CoFlex TLC XL 2-layer Compression System 4x7 (in/yd) 2 x Per Week/7 Days Discharge Instructions: Apply CoFlex  2-layer compression as directed. (alt for 4 layer) Wound #22 - Lower Leg Wound Laterality: Left, Lateral Cleanser: Soap and Water 2 x Per Week/7 Days Discharge Instructions: May shower and wash wound with dial antibacterial soap and water prior to dressing change. Cleanser: Wound Cleanser 2 x Per Week/7 Days Discharge Instructions: Cleanse the wound with wound cleanser prior to applying a clean dressing using gauze sponges, not tissue or cotton balls. Peri-Wound Care: Triamcinolone 15 (g) 2 x Per Week/7 Days Discharge Instructions: Use triamcinolone 15 (g) as directed Peri-Wound Care: Sween Lotion (Moisturizing lotion) 2 x Per Week/7 Days Discharge Instructions: Apply moisturizing lotion as directed Prim Dressing: VAC ary 2 x Per Week/7 Days Compression Wrap: CoFlex TLC XL 2-layer Compression System 4x7 (in/yd) 2 x Per Week/7  Days Discharge Instructions: Apply CoFlex 2-layer compression as directed. (alt for 4 layer) Wound #23 - Upper Leg Wound Laterality: Left, Lateral Peri-Wound Care: Triamcinolone 15 (g) 2 x Per Week Discharge Instructions: Use triamcinolone 15 (g) as directed Peri-Wound Care: Sween Lotion (Moisturizing lotion) 2 x Per Week Discharge Instructions: Apply moisturizing lotion as directed Secondary Dressing: Zetuvit Plus Silicone Border Dressing 7x7(in/in) 2 x Per Week Discharge Instructions: Apply silicone border over primary dressing as directed. Secured With: Elastic Bandage 4 inch (ACE bandage) 2 x Per Week Discharge Instructions: Secure with ACE bandage as directed. Electronic Signature(s) Signed: 07/27/2021 4:50:25 PM By: Linton Ham MD Signed: 07/27/2021 6:25:59 PM By: Baruch Gouty RN, BSN Entered By: Baruch Gouty on 07/27/2021 15:14:08 -------------------------------------------------------------------------------- Problem List Details Patient Name: Date of Service: [name] 07/27/2021 2:45 PM Medical Record Number: [id number] Patient Account Number: [id number] Date of Birth/Sex: Treating RN: 1951/03/20 (71 y.o. [name] Primary Care Provider: [name] Other Clinician: Referring Provider: Treating Provider/Extender: Stormy Card in Treatment: 15 Active Problems ICD-10 Encounter Code Description Active Date MDM Diagnosis E11.621 Type 2 diabetes mellitus with foot ulcer 04/12/2021 No Yes 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 04/12/2021 No Yes I89.0 Lymphedema, not elsewhere classified 04/12/2021 No Yes I87.322 Chronic venous hypertension (idiopathic) with inflammation of left lower 04/12/2021 No Yes extremity 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 ulcer of other part of left lower leg with other specified 04/12/2021 No Yes severity 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 of other part of left foot  with other specified 04/12/2021 No Yes severity 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 07/20/2021 No Yes Inactive Problems ICD-10 Code Description Active Date Inactive Date L02.416 Cutaneous abscess of left lower limb 06/13/2021 06/13/2021 E11.42 Type 2 diabetes mellitus with diabetic polyneuropathy 04/12/2021 04/12/2021 Resolved Problems Electronic Signature(s) Signed: 07/27/2021 4:50:25 PM By: Linton Ham MD Entered By: Linton Ham on 07/27/2021 14:53:46 -------------------------------------------------------------------------------- Progress Note Details Patient Name: Date of Service: [name]. 07/27/2021 2:45 PM Medical Record Number: [id number] Patient Account Number: [id number] Date of Birth/Sex: Treating RN: May 28, 1951 (71 y.o. [name] Primary Care Provider: [name] Other Clinician: Referring Provider: Treating Provider/Extender: Stormy Card in Treatment: 15 Subjective History of Present Illness (HPI) 10/11/17; Mr. Arnett is a 72 year old man who tells me that in 2015 he slipped down the latter traumatizing his left leg. He developed a wound in the same spot the area that we are currently looking at. He states this closed over for the most part although he always felt it was somewhat unstable. In 2016 he hit the same area with the door of his car had this reopened. He tells me that  this is never really closed although sometimes an inflow it remains open on a constant basis. He has not been using any specific dressing to this except for topical antibiotics the nature of which were not really sure. His primary doctor did send him to see Dr. [name] of interventional cardiology. He underwent an angiogram on 08/06/17 and he underwent a PTA and directional atherectomy of the lesser distal SFA and popliteal arteries which resulted in brisk improvement in blood flow. It was noted that he had 2 vessel  runoff through the anterior tibial and peroneal. He is also been to see vascular and interventional radiologist. He was not felt to have any significant superficial venous insufficiency. Presumably is not a candidate for any ablation. It was suggested he come here for wound care. The patient is a type II diabetic on insulin. He also has a history of venous insufficiency. ABIs on the left were noncompressible in our clinic 10/21/17; patient we admitted to the clinic last week. 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 We put Iodosorb on him after an aggressive debridement and 3 layer compression. He complained of pain in his ankle and itching with is skin in fact he scratched the area on the medial calf superiorly at the rim of our wraps and he has 2 small open areas in that location today which are new. I changed his primary dressing today to silver collagen. As noted he is already had revascularization and does not have any significant superficial venous insufficiency that would be amenable to ablation 10/28/17; patient admitted to the clinic 2 weeks ago. He has a smaller Wound. Scratch injury from last week revealed. There is large wound over the tibial area. This is smaller. Granulation looks healthy. No need for debridement. 11/04/17; the wound on the left lateral calf looks better. Improved dimensions. Surface of this looks better. We've been maintaining him and Kerlix Coban wraps. He finds this much more comfortable. Silver collagen dressing 11/11/17; left lateral Wound continues to look healthy be making progress. Using a #5 curet I removed removed nonviable skin from the surface of the wound and then necrotic debris from the wound surface. Surface of the wound continues to look healthy. ooHe also has an open area on the left great toenail bed. We've been using topical antibiotics. 11/19/17; left anterior lateral wound continues to  look healthy but it's not closed. ooHe also had a small wound above this on the left leg ooInitially traumatic wounds in the setting of significant chronic venous insufficiency and stasis dermatitis 11/25/17; left anterior wounds superiorly is closed still a small wound inferiorly. 12/02/17; left anterior tibial area. Arrives today with adherent callus. Post debridement clearly not completely closed. Hydrofera Blue under 3 layer compression. 12/09/17; left anterior tibia. Circumferential eschar however the wound bed looks stable to improved. We've been using Hydrofera Blue under 3 layer compression 12/17/17; left anterior tibia. Apparently this was felt to be closed however when the wrap was taken off there is a skin tear to reopen wounds in the same area we've been using Hydrofera Blue under 3 layer compression 12/23/17 left anterior tibia. Not close to close this week apparently the [facility] was stuck to this again. Still circumferential eschar requiring debridement. I put a contact layer on this this time under the Hydrofera Blue 12/31/17; left anterior tibia. Wound is better slight amount of hyper-granulation. Using Hydrofera Blue over Adaptic. 01/07/18; left anterior tibia.  The wound had some surface eschar however after this was removed he has no open wound.he was already revascularized by Dr. [name] when he came to our clinic with atherectomy of the left SFA and popliteal artery. He was also sent to interventional radiology for venous reflux studies. He was not felt to have significant reflux but certainly has chronic venous changes of his skin with hemosiderin deposition around this area. He will definitely need to lubricate his skin and wear compression stocking and I've talked to him about this. READMISSION 05/26/2018 This is a now 71 year old man we cared for with traumatic wounds on his left anterior lower extremity. He had been previously revascularized during that admission by Dr. [name].  Apparently in follow-up Dr. [name] noted that he had deterioration in his arterial status. He underwent a stent placement in the distal left SFA on 04/22/2018. 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 He went back to the angiography suite on 04/30/2018 he underwent PTA and balloon angioplasty of the occluded left mid anterior tibial artery, thrombotic occlusion went from 100 to 0% which reconstitutes the posterior tibial artery. He had thrombectomy and aspiration of the peroneal artery. The stent placed in the distal SFA left SFA was still occluded. 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 He has been recuperating at [facility] in [facility] after the most recent hospitalization. He is going home tomorrow. He tells me that 2 weeks ago he traumatized the tip of his left fifth toe. He came in urgently for our review of this. This was a history of before I noted that Dr. [name] had already noted dry gangrenous changes of the left fifth toe 06/09/2018; 2-week follow-up. I did contact Dr. [name] after his last appointment and he apparently saw 1 of Dr. Irven Shelling colleagues the next day. He does not follow-up with Dr. [name] himself until Thursday of this week. 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 Nevertheless there is no evidence of infection no drainage and no pain. He had a new area that this week when we were signing him in today on the left anterior mid tibia area, this is in close proximity to the previous wound we have dealt with in this clinic. 06/23/2018; 2-week follow-up. I did not receive a recent note from Dr. [name] to review today. Our office is trying to obtain this. He is apparently not planning  to do further vascular interventions and wondered about compression to try and help with the patient's chronic venous insufficiency. However we are also concerned about the arterial flow. ooHe arrives in clinic today with a new area on the left third toe. The areas on the calf/anterior tibia are close to closing. The left fifth toe is still mummified using Betadine. -In reviewing things with the patient he has what sounds like claudication with mild to moderate amount of activity. 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 I prescribed Levaquin over the phone while we attempted to arrange a plan of care. However the patient called yesterday to report he had low-grade fever and he came in today acutely. There is been a marked deterioration in the left third toe with spreading cellulitis  up into the dorsal left foot. He was referred to the emergency room. Readmission: 06/29/2020 patient presents today for reevaluation here in our clinic he was previously treated by Dr. Dellia Nims at the latter part of 2019 in 2 the beginning of 2020. Subsequently we have not seen him since that time in the interim he did have evaluation with vein and vascular specialist specifically Dr. Anice Paganini who did perform quite extensive work for a left femoral to anterior tibial artery bypass. With that being said in the interim the patient has developed significant lymphedema and has wounds that he tells me have really never healed in regard to the incision site on the left leg. He also has multiple wounds on the feet for various reasons some of which is that he tends to pick at his feet. Fortunately there is no signs of active infection systemically at this time he does have some wounds that are little bit deeper but most are fairly superficial he seems to have good blood flow and overall everything appears to be healthy I see no bone exposed and no obvious signs of osteomyelitis. I do not know  that he necessarily needs a x-ray at this point although that something we could consider depending on how things progress. The patient does have a history of lymphedema, diabetes, this is type II, chronic kidney disease stage III, hypertension, and history of peripheral vascular disease. 07/05/2020; patient admitted last week. Is a patient I remember from 2019 he had a spreading infection involving the left foot and we sent him to the hospital. 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 He subsequently had a left femoral to anterior tibial bypass by Dr.Cain vein and vascular. He also has severe lymphedema with chronic skin changes related to that on the left leg. The most problematic area that was new today was on the left medial great toe. This was apparently a small area last week there was purulent drainage which our intake nurse cultured. Also areas on the left medial foot and heel left lateral foot. He has 2 areas on the left medial calf left lateral calf in the setting of the severe lymphedema. 07/13/2020 on evaluation today patient appears to be doing better in my opinion compared to his last visit. The good news is there is no signs of active infection systemically and locally I do not see any signs of infection either. He did have an x-ray which was negative that is great news he had a culture which showed MRSA but at the same time he is been on the doxycycline which has helped. I do think we may want to extend this for 7 additional days 1/25; patient admitted to the clinic a few weeks ago. 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 We have been putting him under compression his edema control is a lot better but he is severe verricused skin on the left leg. He is really done quite well he still has an open area on the left medial calf and the left medial first metatarsal head. We have been using silver collagen  on the leg silver alginate on the foot 07/27/2020 upon evaluation today patient appears to be doing decently well in regard to his wounds. He still has a lot of dry skin on the left leg. Some of this is starting to peel back and I think he may be able to have them  out by removing some that today. Fortunately there is no signs of active infection at this time on the left leg although on the right leg he does appear to have swelling and erythema as well as some mild warmth to touch. This does have been concerned about the possibility of cellulitis although within the differential diagnosis I do think that potentially a DVT has to be at least considered. We need to rule that out before proceeding would just call in the cellulitis. Especially since he is having pain in the posterior aspect of his calf muscle. 2/8; the patient had seen sparingly. He has severe skin changes of chronic lymphedema in the left leg thickened hyperkeratotic verrucous skin. He has an open wound on the medial part of the left first met head left mid tibia. He also has a rim of nonepithelialized skin in the anterior mid tibia. He brought in the AmLactin lotion that was been prescribed although I am not sure under compression and its utility. There concern about cellulitis on the right lower leg the last time he was here. He was put on on antibiotics. His DVT rule out was negative. The right leg looks fine he is using his stocking on this area 08/10/2020 upon evaluation today patient appears to be doing well with regard to his leg currently. He has been tolerating the dressing changes without complication. Fortunately there is no signs of active infection which is great news. Overall very pleased with where things stand. 2/22; the patient still has an area on the medial part of the left first met his head. This looks better than when I last saw this earlier this month he has a rim of epithelialization but still some surface debris.  Mostly everything on the left leg is healed. There is still a vulnerable in the left mid tibia area. 08/30/2020 upon evaluation today patient appears to be doing much better in regard to his wounds on his foot. 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 Nonetheless I think we will address that today I Minna send in a prescription for him in that regard. Overall though there does not appear to be any signs of significant worsening. 09/07/2020 on evaluation today patient's wounds over his left foot appear to be doing excellent. I do not see any signs of infection there is some callus buildup this can require debridement for certain but overall I feel like he is managing quite nicely. He still using the AmLactin cream which has been beneficial for him as well. 3/22; left foot wound is closed. There is no open area here. He is using ammonium lactate lotion to the lower extremities to help exfoliate dry cracked skin. He has compression stockings from elastic therapy in [place]. The wound on the medial part of his left first met head is healed today. READMISSION 04/12/2021 Mr. Pomerleau is a patient we know fairly well he had a prolonged stay in clinic in 2019 with wounds on his left lateral and left anterior lower extremity in the setting of chronic venous insufficiency. More recently he was here earlier this year with predominantly an area on his left foot first metatarsal head plantar and he says the plantar foot broke down on its not long after we discharged him but he did not come back here. The last few months areas of broken down on his left  anterior and again the left lateral lower extremity. The leg itself is very swollen chronically enlarged a lot of hyperkeratotic dry Berry Q skin in the left lower leg. His edema extends well into the thigh. He was seen by Dr. Donzetta Matters.  He had ABIs on 03/02/2021 showing an ABI on the right of 1 with a TBI of 0.72 his ABI in the left at 1.09 TBI of 0.99. Monophasic and biphasic waveforms on the right. On the left monophasic waveforms were noted he went on to have an angiogram on 03/27/2021 this showed the aortic aortic and iliac segments were free of flow-limiting stenosis the left common femoral vein to evaluate the left femoral to anterior tibial artery bypass was unobstructed the bypass was patent without any areas of stenosis. We discharged the patient in bilateral juxta lite stockings but very clearly that was not sufficient to control the swelling and maintain skin integrity. He is clearly going to need compression pumps. 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 This is fortunate because he is on his feet for long periods of time. 10/27; patient comes in with our intake nurse reporting copious amount of green drainage from the left anterior mid tibia the left dorsal foot and to a lesser extent the left medial mid tibia. We left the compression wrap on all week for the amount of edema in his left leg is quite a bit better. We use silver alginate as the primary dressing 11/3; edema control is good. Left anterior lower leg left medial lower leg and the plantar first metatarsal head. The left anterior lower leg required debridement. Deep tissue culture I did of this wound showed MRSA I put him on 10 days of doxycycline which she will start today. We have him in compression wraps. He has a security card and [employer] however he is retiring on November 15. We will need to then get him into a better offloading boot for the left foot perhaps a total contact cast 11/10; edema control is quite good. Left anterior and left medial lower leg wounds in the setting of chronic venous insufficiency and lymphedema. He also has a substantial area over the left plantar first metatarsal head. I treated  him for MRSA that we identified on the major wound on the left anterior mid tibia with doxycycline and gentamicin topically. He has significant hypergranulation on the left plantar foot wound. The patient is a diabetic but he does not have significant PAD 11/17; edema control is quite good. Left anterior and left medial lower leg wounds look better. The really concerning area remains the area on the left plantar first metatarsal head. He has a rim of epithelialization. 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 Starting today with a forefoot off loader but . possibly a total contact cast. 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 Arrives with a very significant odor from I think all of the wounds on the left leg and the left foot. Because of this I did not put a total contact cast on him today but will could still consider this. His wife was having cataract surgery which is the reason he  missed the appointment 12/6. I saw this man 5 days ago with a swelling below the popliteal fossa. I thought he actually might have a Baker's cyst however the DVT rule out study that we could arrange right away was negative the technician told me this was not a ruptured Baker's cyst. We attempted to get this aspirated by under ultrasound guidance in interventional radiology however all they did was an ultrasound however it shows an extensive fluid collection 62 x 8 x 9.4 in the left thigh and left calf. The patient states he thinks this started 8 days ago or so but he really is not complaining of any pain, fever or systemic symptoms. He has not ha 12/20; after some difficulty I managed to get the patient into see Dr. Donzetta Matters. Eventually he was taken into the hospital and had a drain put in the fluid  collection below his left knee posteriorly extending into the posterior thigh. He still has the drain in place. Culture of this showed moderate staff aureus few Morganella and few Klebsiella he is now on doxycycline and ciprofloxacin as suggested by infectious disease he is on this for a month. The drain will remain in place until it stops draining 12/29; he comes in today with the 1 wound on his left leg and the area on the left plantar first met head significantly smaller. Both look healthy. He still has the drain in the left leg. He says he has to change this daily. Follows up with Dr. Donzetta Matters on January 11. 06/29/2021; the wounds that I am following on the left leg and left first met head continued to be quite healthy. However the area where his inferior drain is in place had copious amounts of drainage which was green in color. The wound here is larger. Follows up with Dr. Gwenlyn Saran of vein and vascular his surgeon next week as well as infectious disease. He remains on ciprofloxacin and doxycycline. He is not complaining of excessive pain in either one of the drain areas 1/12; the patient saw vascular surgery and infectious disease. Vascular surgery has left the drain in place as there was still some notable drainage still see him back in 2 weeks. Dr. Velna Ochs stop the doxycycline and ciprofloxacin and I do not believe he follows up with them at this point. Culture I did last week showed both doxycycline resistant MRSA and Pseudomonas not sensitive to ciprofloxacin although only in rare titers 1/19; the patient's wound on the left anterior lower leg is just about healed. We have continued healing of the area that was medially on the left leg. Left first plantar metatarsal head continues to get smaller. The major problem here is his 2 drain sites 1 on the left upper calf and lateral thigh. There is purulent drainage still from the left lateral thigh. I gave him antibiotics last week but we still have  recultured. He has the drain in the area I think this is eventually going to have to come out. I suspect there will be a connecting wound to heal here perhaps with improved VAc 1/26; the patient had his drain removed by vein and vascular on 1/25/. This was a large pocket of fluid in his left thigh that seem to tunnel into his left upper calf. 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 His mention his Penrose drain was removed today. He now has a tunneling wound on his left calf and left thigh. Both of these probe widely towards each  other although I cannot really prove that they connect. Both wounds on his lower leg anteriorly are closed and his area over the first metatarsal head on his right foot continues to improve. We are using Hydrofera Blue here. He also saw infectious disease culture of the abscess they noted was polymicrobial with MRSA, Morganella and Klebsiella he was treated with doxycycline and ciprofloxacin for 4 weeks ending on 07/03/2021. They did not recommend any further antibiotics. Notable that while he still had the Penrose drain in place last week he had purulent drainage coming out of the inferior IandD site this grew [facility], MRSA and Pseudomonas but there does not appear to be any active infection in this area today with the drain out and he is not systemically unwell 2/2; with regards to the drain sites the superior one on the thigh actually is closed down the one on the upper left lateral calf measures about 8 and half centimeters which is an improvement seems to be less prominent although still with a lot of drainage. The only remaining wound is over the first metatarsal head on the left foot and this looks to be continuing to improve with Hydrofera Blue. Objective Constitutional Patient is hypertensive.. Pulse regular and within target range for patient.Marland Kitchen Respirations regular, non-labored and within target range.. Temperature is normal and within the  target range for the patient.Marland Kitchen Appears in no distress. Vitals Time Taken: 2:15 PM, Height: 74 in, Weight: 238 lbs, BMI: 30.6, Temperature: 98.2 F, Pulse: 76 bpm, Respiratory Rate: 20 breaths/min, Blood Pressure: 181/78 mmHg. General Notes: Wound exam; left anterior lower leg wound is closed and as is the tibial wound. He still has a clean smaller area on the plantar first metatarsal head. The major wound now is the drain site on the left lateral calf upper aspect. This goes in a point 5 cm. Still a lot of drainage. We will VAC that today with gauze will come back in our clinic to change this. I do not feel any increased swelling in the thigh there is no obvious return of the fluid collection which I think was probably a seroma that got secondarily infected Integumentary (Hair, Skin) Wound #18 status is Open. Original cause of wound was Gradually Appeared. The date acquired was: 08/23/2020. The wound has been in treatment 15 weeks. The wound is located on the Left,Plantar Metatarsal head first. The wound measures 1.5cm length x 2.2cm width x 0.1cm depth; 2.592cm^2 area and 0.259cm^3 volume. There is Fat Layer (Subcutaneous Tissue) exposed. There is no tunneling or undermining noted. There is a medium amount of serosanguineous drainage noted. The wound margin is distinct with the outline attached to the wound base. There is large (67-100%) red granulation within the wound bed. There is no necrotic tissue within the wound bed. Wound #19 status is Open. Original cause of wound was Gradually Appeared. The date acquired was: 08/23/2020. The wound has been in treatment 15 weeks. The wound is located on the Left,Anterior Lower Leg. The wound measures 0.2cm length x 0.2cm width x 0.1cm depth; 0.031cm^2 area and 0.003cm^3 volume. There is Fat Layer (Subcutaneous Tissue) exposed. There is no tunneling or undermining noted. There is a small amount of serous drainage noted. The wound margin is distinct with the  outline attached to the wound base. There is small (1-33%) red granulation within the wound bed. There is no necrotic tissue within the wound bed. Wound #22 status is Open. Original cause of wound was Bump. The date acquired was:  06/03/2021. The wound has been in treatment 7 weeks. The wound is located on the Left,Lateral Lower Leg. The wound measures 2.8cm length x 2.2cm width x 0.8cm depth; 4.838cm^2 area and 3.87cm^3 volume. There is Fat Layer (Subcutaneous Tissue) exposed. There is no undermining noted, however, there is tunneling at 12:00 with a maximum distance of 8.8cm. There is a large amount of purulent drainage noted. The wound margin is distinct with the outline attached to the wound base. There is large (67-100%) red granulation within the wound bed. There is no necrotic tissue within the wound bed. Wound #23 status is Open. Original cause of wound was Laceration. The date acquired was: 06/05/2021. The wound has been in treatment 1 weeks. The wound is located on the Left,Lateral Upper Leg. The wound measures 0.3cm length x 0.3cm width x 0.4cm depth; 0.071cm^2 area and 0.028cm^3 volume. There is Fat Layer (Subcutaneous Tissue) exposed. There is no tunneling or undermining noted. There is a small amount of serosanguineous drainage noted. The wound margin is distinct with the outline attached to the wound base. There is large (67-100%) red granulation within the wound bed. There is no necrotic tissue within the wound bed. 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 There was a Double Layer Compression Therapy Procedure by Baruch Gouty, RN. Post procedure Diagnosis Wound #19: Same as Pre-Procedure Plan 1. I am continuing with Hydrofera Blue to the plantar foot on the left 2. We are going to wound VAC to the proximal drain site on the left lateral upper thigh. The distal site only has about 0.4 cm of depth I do not think it is going to be an issue putting him back on the lower wound site. Electronic Signature(s) Signed: 07/27/2021 4:50:25 PM By: Linton Ham MD Entered By: Linton Ham on 07/27/2021 14:57:44 -------------------------------------------------------------------------------- SuperBill Details Patient Name: Date of Service: [name] 07/27/2021 Medical Record Number: [id number] Patient Account Number: [id number] Date of Birth/Sex: Treating RN: March 20, 1951 (71 y.o. [name] Primary Care Provider: [name] Other Clinician: Referring Provider: Treating Provider/Extender: Stormy Card in Treatment: 15 Diagnosis Coding ICD-10 Codes Code Description [phone] Type 2 diabetes mellitus with foot ulcer 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 I89.0 Lymphedema, not elsewhere classified I87.322 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verity 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 Facility Procedures CPT4 Code: [id number] Description: 41638 - WOUND VAC-50 SQ CM OR LESS Modifier: Quantity: 1 Physician  Procedures : CPT4 Code Description Modifier [id number] - WC PHYS LEVEL 4 - EST PT ICD-10 Diagnosis Description 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verity 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 of other part of  left foot with other specified severity E11.621 Type  2 diabetes mellitus with foot ulcer 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 Quantity: 1 Electronic Signature(s) Signed: 07/27/2021 4:50:25 PM By: Linton Ham MD Signed: 07/27/2021 6:25:59 PM By: Baruch Gouty RN, BSN Entered By: Baruch Gouty on 07/27/2021 15:24:46

## 2021-07-27 NOTE — Progress Notes (Signed)
CHRISTIANJAMES, SOULE ([id number]) Visit Report for 07/27/2021 Arrival Information Details Patient Name: Date of Service: [name], [name] 07/27/2021 2:45 PM Medical Record Number: [id number] Patient Account Number: [id number] Date of Birth/Sex: Treating RN: [birth year] (71 y.o. [name], [name] Primary Care Kleber Crean: [name] Other Clinician: Referring Audiel Scheiber: Treating Chapman Matteucci/Extender: Stormy Card in Treatment: 15 Visit Information History Since Last Visit Added or deleted any medications: No Patient Arrived: Ambulatory Any new allergies or adverse reactions: No Arrival Time: 14:13 Had a fall or experienced change in No Accompanied By: self activities of daily living that may affect Transfer Assistance: None risk of falls: Patient Identification Verified: Yes Signs or symptoms of abuse/neglect since last visito No Secondary Verification Process Completed: Yes Hospitalized since last visit: No Patient Requires Transmission-Based Precautions: No Implantable device outside of the clinic excluding No Patient Has Alerts: Yes cellular tissue based products placed in the center Patient Alerts: ABI's: 09/22 L:1.09 R: 1. since last visit: TBI's: R:0.72 L:0.99 Has Dressing in Place as Prescribed: Yes Has Compression in Place as Prescribed: Yes Pain Present Now: No Electronic Signature(s) Signed: 07/27/2021 6:25:59 PM By: Baruch Gouty RN, BSN Entered By: Baruch Gouty on 07/27/2021 14:15:09 -------------------------------------------------------------------------------- Compression Therapy Details Patient Name: Date of Service: [name] 07/27/2021 2:45 PM Medical Record Number: [id number] Patient Account Number: [id number] Date of Birth/Sex: Treating RN: Jun 15, 1951 (71 y.o. [name] Primary Care Millan Legan: [name] Other Clinician: Referring Audreyana Huntsberry: Treating Raydel Hosick/Extender: Stormy Card in Treatment:  15 Compression Therapy Performed for Wound Assessment: Wound #19 Left,Anterior Lower Leg Performed By: Clinician Baruch Gouty, RN Compression Type: Double Layer Post Procedure Diagnosis Same as Pre-procedure Electronic Signature(s) Signed: 07/27/2021 6:25:59 PM By: Baruch Gouty RN, BSN Entered By: Baruch Gouty on 07/27/2021 14:46:57 -------------------------------------------------------------------------------- Encounter Discharge Information Details Patient Name: Date of Service: [name]. 07/27/2021 2:45 PM Medical Record Number: [id number] Patient Account Number: [id number] Date of Birth/Sex: Treating RN: 1950/10/03 (71 y.o. [name] Primary Care Delbra Zellars: [name] Other Clinician: Referring Johney Perotti: Treating Meztli Llanas/Extender: Stormy Card in Treatment: 15 Encounter Discharge Information Items Discharge Condition: Stable Ambulatory Status: Ambulatory Discharge Destination: Home Transportation: Private Auto Accompanied By: self Schedule Follow-up Appointment: Yes Clinical Summary of Care: Patient Declined Electronic Signature(s) Signed: 07/27/2021 6:25:59 PM By: Baruch Gouty RN, BSN Entered By: Baruch Gouty on 07/27/2021 15:18:16 -------------------------------------------------------------------------------- Lower Extremity Assessment Details Patient Name: Date of Service: [name]. 07/27/2021 2:45 PM Medical Record Number: [id number] Patient Account Number: [id number] Date of Birth/Sex: Treating RN: [birth year] (71 y.o. [name] Primary Care Maurianna Benard: [name] Other Clinician: Referring Draedyn Weidinger: Treating Cheikh Bramble/Extender: Stormy Card in Treatment: 15 Edema Assessment Assessed: [name]: No] [name]: No] Edema: [Left: Ye] [Right: s] Calf Left: Right: Point of Measurement: 41 cm From Medial Instep 44 cm Ankle Left: Right: Point of Measurement: 10 cm From Medial Instep  28 cm Vascular Assessment Pulses: Dorsalis Pedis Palpable: [Left:Yes] Electronic Signature(s) Signed: 07/27/2021 6:25:59 PM By: Baruch Gouty RN, BSN Entered By: Baruch Gouty on 07/27/2021 14:25:07 -------------------------------------------------------------------------------- Multi Wound Chart Details Patient Name: Date of Service: [name]. 07/27/2021 2:45 PM Medical Record Number: [id number] Patient Account Number: [id number] Date of Birth/Sex: Treating RN: 05-16-1951 (71 y.o. [name] Primary Care Maureen Duesing: [name] Other Clinician: Referring Lynzi Meulemans: Treating Taimi Towe/Extender: Stormy Card in Treatment: 15 Vital Signs Height(in): 74 Pulse(bpm): 51 Weight(lbs): 017 Blood Pressure(mmHg): 181/78 Body Mass Index(BMI): 30.6 Temperature(F): 98.2  Respiratory Rate(breaths/min): 20 Photos: Left, Plantar Metatarsal head first Left, Anterior Lower Leg Left, Lateral Lower Leg Wound Location: Gradually Appeared Gradually Appeared Bump Wounding Event: Diabetic Wound/Ulcer of the Lower Diabetic Wound/Ulcer of the Lower Cyst Primary Etiology: Extremity Extremity Glaucoma, Sleep Apnea, Glaucoma, Sleep Apnea, Glaucoma, Sleep Apnea, Comorbid History: Hypertension, Peripheral Arterial Hypertension, Peripheral Arterial Hypertension, Peripheral Arterial Disease, Peripheral Venous Disease, Disease, Peripheral Venous Disease, Disease, Peripheral Venous Disease, Type II Diabetes, Gout, Osteoarthritis, Type II Diabetes, Gout, Osteoarthritis, Type II Diabetes, Gout, Osteoarthritis, Neuropathy Neuropathy Neuropathy 08/23/2020 08/23/2020 06/03/2021 Date Acquired: 15 15 7  Weeks of Treatment: Open Open Open Wound Status: No No No Wound Recurrence: 1.5x2.2x0.1 0.2x0.2x0.1 2.8x2.2x0.8 Measurements L x W x D (cm) 2.592 0.031 4.838 A (cm) : rea 0.259 0.003 3.87 Volume (cm) : 81.70% 99.80% -193.40% % Reduction in A rea: 90.80% 99.90%  -193.40% % Reduction in Volume: 12 Position 1 (o'clock): 8.8 Maximum Distance 1 (cm): No No Yes Tunneling: Grade 2 Grade 2 Full Thickness With Exposed Support Classification: Structures Medium Small Large Exudate Amount: Serosanguineous Serous Purulent Exudate Type: red, brown amber yellow, brown, green Exudate Color: Distinct, outline attached Distinct, outline attached Distinct, outline attached Wound Margin: Large (67-100%) Small (1-33%) Large (67-100%) Granulation Amount: Red Red Red Granulation Quality: None Present (0%) None Present (0%) None Present (0%) Necrotic Amount: Fat Layer (Subcutaneous Tissue): Yes Fat Layer (Subcutaneous Tissue): Yes Fat Layer (Subcutaneous Tissue): Yes Exposed Structures: Fascia: No Fascia: No Fascia: No Tendon: No Tendon: No Tendon: No Muscle: No Muscle: No Muscle: No Joint: No Joint: No Joint: No Bone: No Bone: No Bone: No Small (1-33%) Large (67-100%) Small (1-33%) Epithelialization: N/A Compression Therapy Negative Pressure Wound Therapy Procedures Performed: Application (NPWT) Wound Number: 23 N/A N/A Photos: No Photos N/A N/A Left, Lateral Upper Leg N/A N/A Wound Location: Laceration N/A N/A Wounding Event: Cyst N/A N/A Primary Etiology: Glaucoma, Sleep Apnea, N/A N/A Comorbid History: Hypertension, Peripheral Arterial Disease, Peripheral Venous Disease, Type II Diabetes, Gout, Osteoarthritis, Neuropathy 06/05/2021 N/A N/A Date Acquired: 1 N/A N/A Weeks of Treatment: Open N/A N/A Wound Status: No N/A N/A Wound Recurrence: 0.3x0.3x0.4 N/A N/A Measurements L x W x D (cm) 0.071 N/A N/A A (cm) : rea 0.028 N/A N/A Volume (cm) : 66.50% N/A N/A % Reduction in Area: 83.50% N/A N/A % Reduction in Volume: No N/A N/A Tunneling: Full Thickness Without Exposed N/A N/A Classification: Support Structures Small N/A N/A Exudate Amount: Serosanguineous N/A N/A Exudate Type: red, brown N/A N/A Exudate  Color: Distinct, outline attached N/A N/A Wound Margin: Large (67-100%) N/A N/A Granulation Amount: Red N/A N/A Granulation Quality: None Present (0%) N/A N/A Necrotic Amount: Fat Layer (Subcutaneous Tissue): Yes N/A N/A Exposed Structures: Fascia: No Tendon: No Muscle: No Joint: No Bone: No Medium (34-66%) N/A N/A Epithelialization: N/A N/A N/A Procedures Performed: Treatment Notes Electronic Signature(s) Signed: 07/27/2021 4:50:25 PM By: Linton Ham MD Signed: 07/27/2021 6:25:59 PM By: Baruch Gouty RN, BSN Entered By: Linton Ham on 07/27/2021 14:53:57 -------------------------------------------------------------------------------- Multi-Disciplinary Care Plan Details Patient Name: Date of Service: [name]. 07/27/2021 2:45 PM Medical Record Number: [id number] Patient Account Number: [id number] Date of Birth/Sex: Treating RN: 03/17/1951 (71 y.o. [name] Primary Care Samay Delcarlo: [name] Other Clinician: Referring Thimothy Barretta: Treating Aleathia Purdy/Extender: Stormy Card in Treatment: 15 Multidisciplinary Care Plan reviewed with physician Active Inactive Soft Tissue Infection Nursing Diagnoses: Impaired tissue integrity Potential for infection: soft tissue Goals: Patient's soft tissue infection will resolve Date Initiated: 04/20/2021 Target Resolution Date:  08/24/2021 Goal Status: Active Interventions: Assess signs and symptoms of infection every visit Provide education on infection Treatment Activities: Culture : 04/20/2021 Education provided on Infection : 07/24/2021 Systemic antibiotics : 04/20/2021 T ordered outside of clinic : 04/20/2021 est Notes: Venous Leg Ulcer Nursing Diagnoses: Knowledge deficit related to disease process and management Potential for venous Insuffiency (use before diagnosis confirmed) Goals: Patient will maintain optimal edema control Date Initiated: 07/27/2021 Target Resolution Date:  08/24/2021 Goal Status: Active Interventions: Assess peripheral edema status every visit. Treatment Activities: Therapeutic compression applied : 07/27/2021 Notes: Wound/Skin Impairment Nursing Diagnoses: Impaired tissue integrity Knowledge deficit related to ulceration/compromised skin integrity Goals: Patient will have a decrease in wound volume by X% from date: (specify in notes) Date Initiated: 04/12/2021 Date Inactivated: 04/27/2021 Target Resolution Date: 04/23/2021 Goal Status: Met Patient/caregiver will verbalize understanding of skin care regimen Date Initiated: 04/12/2021 Target Resolution Date: 08/24/2021 Goal Status: Active Ulcer/skin breakdown will have a volume reduction of 30% by week 4 Date Initiated: 04/12/2021 Date Inactivated: 04/27/2021 Target Resolution Date: 04/27/2021 Goal Status: Unmet Unmet Reason: infection Ulcer/skin breakdown will have a volume reduction of 50% by week 8 Date Initiated: 04/27/2021 Date Inactivated: 06/29/2021 Target Resolution Date: 06/24/2021 Goal Status: Met Interventions: Assess patient/caregiver ability to obtain necessary supplies Assess patient/caregiver ability to perform ulcer/skin care regimen upon admission and as needed Assess ulceration(s) every visit Notes: Electronic Signature(s) Signed: 07/27/2021 6:25:59 PM By: Baruch Gouty RN, BSN Entered By: Baruch Gouty on 07/27/2021 14:45:28 -------------------------------------------------------------------------------- Negative Pressure Wound Therapy Application (NPWT) Details Patient Name: Date of Service: [name], [name] 07/27/2021 2:45 PM Medical Record Number: [id number] Patient Account Number: [id number] Date of Birth/Sex: Treating RN: Sep 11, 1950 (72 y.o. [name] Primary Care Ernestine Langworthy: [name] Other Clinician: Referring Karryn Kosinski: Treating Maisa Bedingfield/Extender: Stormy Card in Treatment: 15 NPWT Application Performed for: Wound #22  Left, Lateral Lower Leg Additional Injuries Covered: No Performed By: Baruch Gouty, RN Type: VAC System Coverage Size (sq cm): 6.16 Pressure Type: Constant Pressure Setting: 125 mmHG Drain Type: None Quantity of Sponges/Gauze Inserted: 2 Sponge/Dressing Type: Combination Combination: gauze in tunnel, black foam Date Initiated: 07/27/2021 Response to Treatment: good Post Procedure Diagnosis Same as Pre-procedure Electronic Signature(s) Signed: 07/27/2021 6:25:59 PM By: Baruch Gouty RN, BSN Entered By: Baruch Gouty on 07/27/2021 14:48:27 -------------------------------------------------------------------------------- Pain Assessment Details Patient Name: Date of Service: [name] 07/27/2021 2:45 PM Medical Record Number: [id number] Patient Account Number: [id number] Date of Birth/Sex: Treating RN: July 22, 1950 (71 y.o. [name] Primary Care Ardis Fullwood: [name] Other Clinician: Referring Deonne Rooks: Treating Mishell Donalson/Extender: Stormy Card in Treatment: 15 Active Problems Location of Pain Severity and Description of Pain Patient Has Paino No Site Locations Rate the pain. Current Pain Level: 0 Character of Pain Describe the Pain: Tender Pain Management and Medication Current Pain Management: Electronic Signature(s) Signed: 07/27/2021 6:25:59 PM By: Baruch Gouty RN, BSN Entered By: Baruch Gouty on 07/27/2021 14:17:36 -------------------------------------------------------------------------------- Patient/Caregiver Education Details Patient Name: Date of Service: [name] 2/2/2023andnbsp2:45 PM Medical Record Number: [id number] Patient Account Number: [id number] Date of Birth/Gender: Treating RN: [birth year] (71 y.o. [name] Primary Care Physician: [name] Other Clinician: Referring Physician: Treating Physician/Extender: Stormy Card in Treatment: 15 Education  Assessment Education Provided To: Patient Education Topics Provided Infection: Methods: Explain/Verbal Responses: Reinforcements needed Venous: Methods: Explain/Verbal Responses: Reinforcements needed, State content correctly Wound/Skin Impairment: Methods: Explain/Verbal Responses: Reinforcements needed, State content correctly Electronic Signature(s) Signed: 07/27/2021 6:25:59 PM By: Baruch Gouty  RN, BSN Entered By: Baruch Gouty on 07/27/2021 14:46:05 -------------------------------------------------------------------------------- Wound Assessment Details Patient Name: Date of Service: [name], [name] 07/27/2021 2:45 PM Medical Record Number: [id number] Patient Account Number: [id number] Date of Birth/Sex: Treating RN: 07-31-1950 (71 y.o. [name] Primary Care Savaughn Karwowski: [name] Other Clinician: Referring Lanyah Spengler: Treating Hazel Leveille/Extender: Stormy Card in Treatment: 15 Wound Status Wound Number: 18 Primary Diabetic Wound/Ulcer of the Lower Extremity Etiology: Wound Location: Left, Plantar Metatarsal head first Wound Open Wounding Event: Gradually Appeared Status: Date Acquired: 08/23/2020 Comorbid Glaucoma, Sleep Apnea, Hypertension, Peripheral Arterial Disease, Weeks Of Treatment: 15 History: Peripheral Venous Disease, Type II Diabetes, Gout, Osteoarthritis, Clustered Wound: No Neuropathy Photos Wound Measurements Length: (cm) 1.5 Width: (cm) 2.2 Depth: (cm) 0.1 Area: (cm) 2.592 Volume: (cm) 0.259 % Reduction in Area: 81.7% % Reduction in Volume: 90.8% Epithelialization: Small (1-33%) Tunneling: No Undermining: No Wound Description Classification: Grade 2 Wound Margin: Distinct, outline attached Exudate Amount: Medium Exudate Type: Serosanguineous Exudate Color: red, brown Foul Odor After Cleansing: No Slough/Fibrino No Wound Bed Granulation Amount: Large (67-100%) Exposed Structure Granulation Quality:  Red Fascia Exposed: No Necrotic Amount: None Present (0%) Fat Layer (Subcutaneous Tissue) Exposed: Yes Tendon Exposed: No Muscle Exposed: No Joint Exposed: No Bone Exposed: No Electronic Signature(s) Signed: 07/27/2021 6:25:59 PM By: Baruch Gouty RN, BSN Entered By: Baruch Gouty on 07/27/2021 14:34:17 -------------------------------------------------------------------------------- Wound Assessment Details Patient Name: Date of Service: [name]. 07/27/2021 2:45 PM Medical Record Number: [id number] Patient Account Number: [id number] Date of Birth/Sex: Treating RN: 1951-01-06 (71 y.o. [name] Primary Care Yaeli Hartung: [name] Other Clinician: Referring Jaime Dome: Treating Gracielynn Birkel/Extender: Stormy Card in Treatment: 15 Wound Status Wound Number: 19 Primary Diabetic Wound/Ulcer of the Lower Extremity Etiology: Wound Location: Left, Anterior Lower Leg Wound Open Wounding Event: Gradually Appeared Status: Date Acquired: 08/23/2020 Comorbid Glaucoma, Sleep Apnea, Hypertension, Peripheral Arterial Disease, Weeks Of Treatment: 15 History: Peripheral Venous Disease, Type II Diabetes, Gout, Osteoarthritis, Clustered Wound: No Neuropathy Photos Wound Measurements Length: (cm) 0.2 Width: (cm) 0.2 Depth: (cm) 0.1 Area: (cm) 0.031 Volume: (cm) 0.003 % Reduction in Area: 99.8% % Reduction in Volume: 99.9% Epithelialization: Large (67-100%) Tunneling: No Undermining: No Wound Description Classification: Grade 2 Wound Margin: Distinct, outline attached Exudate Amount: Small Exudate Type: Serous Exudate Color: amber Foul Odor After Cleansing: No Slough/Fibrino No Wound Bed Granulation Amount: Small (1-33%) Exposed Structure Granulation Quality: Red Fascia Exposed: No Necrotic Amount: None Present (0%) Fat Layer (Subcutaneous Tissue) Exposed: Yes Tendon Exposed: No Muscle Exposed: No Joint Exposed: No Bone Exposed:  No Electronic Signature(s) Signed: 07/27/2021 6:25:59 PM By: Baruch Gouty RN, BSN Entered By: Baruch Gouty on 07/27/2021 14:35:05 -------------------------------------------------------------------------------- Wound Assessment Details Patient Name: Date of Service: [name]. 07/27/2021 2:45 PM Medical Record Number: [id number] Patient Account Number: [id number] Date of Birth/Sex: Treating RN: 1951-05-12 (71 y.o. [name] Primary Care Jasper Ruminski: [name] Other Clinician: Referring Izola Teague: Treating Martie Muhlbauer/Extender: Stormy Card in Treatment: 15 Wound Status Wound Number: 22 Primary Cyst Etiology: Wound Location: Left, Lateral Lower Leg Wound Open Wounding Event: Bump Status: Date Acquired: 06/03/2021 Comorbid Glaucoma, Sleep Apnea, Hypertension, Peripheral Arterial Disease, Weeks Of Treatment: 7 History: Peripheral Venous Disease, Type II Diabetes, Gout, Osteoarthritis, Clustered Wound: No Neuropathy Photos Wound Measurements Length: (cm) 2.8 Width: (cm) 2.2 Depth: (cm) 0.8 Area: (cm) 4.838 Volume: (cm) 3.87 % Reduction in Area: -193.4% % Reduction in Volume: -193.4% Epithelialization: Small (1-33%) Tunneling: Yes Position (o'clock): 12 Maximum  Distance: (cm) 8.8 Undermining: No Wound Description Classification: Full Thickness With Exposed Support Structures Wound Margin: Distinct, outline attached Exudate Amount: Large Exudate Type: Purulent Exudate Color: yellow, brown, green Foul Odor After Cleansing: No Slough/Fibrino No Wound Bed Granulation Amount: Large (67-100%) Exposed Structure Granulation Quality: Red Fascia Exposed: No Necrotic Amount: None Present (0%) Fat Layer (Subcutaneous Tissue) Exposed: Yes Tendon Exposed: No Muscle Exposed: No Joint Exposed: No Bone Exposed: No Electronic Signature(s) Signed: 07/27/2021 6:25:59 PM By: Baruch Gouty RN, BSN Entered By: Baruch Gouty on 07/27/2021  14:35:56 -------------------------------------------------------------------------------- Wound Assessment Details Patient Name: Date of Service: [name]. 07/27/2021 2:45 PM Medical Record Number: [id number] Patient Account Number: [id number] Date of Birth/Sex: Treating RN: May 04, 1951 (71 y.o. [name] Primary Care Jaylin Benzel: [name] Other Clinician: Referring Amulya Quintin: Treating Brayden Betters/Extender: Stormy Card in Treatment: 15 Wound Status Wound Number: 23 Primary Cyst Etiology: Wound Location: Left, Lateral Upper Leg Wound Open Wounding Event: Laceration Status: Date Acquired: 06/05/2021 Comorbid Glaucoma, Sleep Apnea, Hypertension, Peripheral Arterial Disease, Weeks Of Treatment: 1 History: Peripheral Venous Disease, Type II Diabetes, Gout, Osteoarthritis, Clustered Wound: No Neuropathy Wound Measurements Length: (cm) 0.3 Width: (cm) 0.3 Depth: (cm) 0.4 Area: (cm) 0.071 Volume: (cm) 0.028 % Reduction in Area: 66.5% % Reduction in Volume: 83.5% Epithelialization: Medium (34-66%) Tunneling: No Undermining: No Wound Description Classification: Full Thickness Without Exposed Support Structures Wound Margin: Distinct, outline attached Exudate Amount: Small Exudate Type: Serosanguineous Exudate Color: red, brown Foul Odor After Cleansing: No Slough/Fibrino No Wound Bed Granulation Amount: Large (67-100%) Exposed Structure Granulation Quality: Red Fascia Exposed: No Necrotic Amount: None Present (0%) Fat Layer (Subcutaneous Tissue) Exposed: Yes Tendon Exposed: No Muscle Exposed: No Joint Exposed: No Bone Exposed: No Electronic Signature(s) Signed: 07/27/2021 6:25:59 PM By: Baruch Gouty RN, BSN Entered By: Baruch Gouty on 07/27/2021 14:38:32 -------------------------------------------------------------------------------- Hodgkins Details Patient Name: Date of Service: [name]. 07/27/2021 2:45 PM Medical  Record Number: [id number] Patient Account Number: [id number] Date of Birth/Sex: Treating RN: 25-Jun-1951 (71 y.o. [name] Primary Care Avyukt Cimo: [name] Other Clinician: Referring Birttany Dechellis: Treating Jeyda Siebel/Extender: Stormy Card in Treatment: 15 Vital Signs Time Taken: 14:15 Temperature (F): 98.2 Height (in): 74 Pulse (bpm): 76 Weight (lbs): 238 Respiratory Rate (breaths/min): 20 Body Mass Index (BMI): 30.6 Blood Pressure (mmHg): 181/78 Reference Range: 80 - 120 mg / dl Electronic Signature(s) Signed: 07/27/2021 6:25:59 PM By: Baruch Gouty RN, BSN Entered By: Baruch Gouty on 07/27/2021 14:16:41

## 2021-07-31 ENCOUNTER — Encounter (HOSPITAL_BASED_OUTPATIENT_CLINIC_OR_DEPARTMENT_OTHER): Payer: Medicare Other | Admitting: Internal Medicine

## 2021-07-31 ENCOUNTER — Other Ambulatory Visit: Payer: Self-pay

## 2021-07-31 DIAGNOSIS — E11621 Type 2 diabetes mellitus with foot ulcer: Secondary | ICD-10-CM | POA: Diagnosis not present

## 2021-07-31 NOTE — Progress Notes (Signed)
TRAJAN, GROVE ([id number]) Visit Report for 07/31/2021 SuperBill Details Patient Name: Date of Service: [name], [name] 07/31/2021 Medical Record Number: [id number] Patient Account Number: [id number] Date of Birth/Sex: Treating RN: 11/26/1950 (71 y.o. 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verity 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

## 2021-07-31 NOTE — Progress Notes (Signed)
[name], [name] ([id number]) Visit Report for 07/31/2021 Arrival Information Details Patient Name: Date of Service: [name], [name] 07/31/2021 9:00 A M Medical Record Number: [id number] Patient Account Number: [id number] Date of Birth/Sex: Treating RN: 09-26-1950 (71 y.o. [name], [name] Primary Care Darielle Hancher: [name] Other Clinician: Referring Trachelle Low: Treating Nyasha Rahilly/Extender: Cornelious Bryant in Treatment: 15 Visit Information History Since Last Visit Added or deleted any medications: No Patient Arrived: Ambulatory Any new allergies or adverse reactions: No Arrival Time: 09:13 Had a fall or experienced change in No Accompanied By: self activities of daily living that may affect Transfer Assistance: None risk of falls: Patient Identification Verified: Yes Signs or symptoms of abuse/neglect since last visito No Secondary Verification Process Completed: Yes Hospitalized since last visit: No Patient Requires Transmission-Based Precautions: No Implantable device outside of the clinic excluding No Patient Has Alerts: Yes cellular tissue based products placed in the center Patient Alerts: ABI's: 09/22 L:1.09 R: 1. since last visit: TBI's: R:0.72 L:0.99 Has Dressing in Place as Prescribed: Yes Has Compression in Place as Prescribed: Yes Pain Present Now: No Electronic Signature(s) Signed: 07/31/2021 4:47:34 PM By: Deon Pilling RN, BSN Entered By: Deon Pilling on 07/31/2021 09:17:59 -------------------------------------------------------------------------------- Compression Therapy Details Patient Name: Date of Service: [name] Garfinkel. 07/31/2021 9:00 A M Medical Record Number: [id number] Patient Account Number: [id number] Date of Birth/Sex: Treating RN: 19-Jan-1951 (71 y.o. [name] Primary Care Brooklynn Brandenburg: [name] Other Clinician: Referring Avira Tillison: Treating Argentina Kosch/Extender: Cornelious Bryant in Treatment:  15 Compression Therapy Performed for Wound Assessment: Wound #18 Left,Plantar Metatarsal head first Performed By: Clinician Deon Pilling, RN Compression Type: Double Layer Electronic Signature(s) Signed: 07/31/2021 4:47:34 PM By: Deon Pilling RN, BSN Entered By: Deon Pilling on 07/31/2021 09:36:54 -------------------------------------------------------------------------------- Compression Therapy Details Patient Name: Date of Service: [name] Garfinkel. 07/31/2021 9:00 A M Medical Record Number: [id number] Patient Account Number: [id number] Date of Birth/Sex: Treating RN: [birth year] (71 y.o. [name] Primary Care Shanena Pellegrino: [name] Other Clinician: Referring Keeanna Villafranca: Treating Naveya Ellerman/Extender: Cornelious Bryant in Treatment: 15 Compression Therapy Performed for Wound Assessment: Wound #19 Left,Anterior Lower Leg Performed By: Clinician Deon Pilling, RN Compression Type: Double Layer Electronic Signature(s) Signed: 07/31/2021 4:47:34 PM By: Deon Pilling RN, BSN Entered By: Deon Pilling on 07/31/2021 09:36:54 -------------------------------------------------------------------------------- Compression Therapy Details Patient Name: Date of Service: [name] Garfinkel. 07/31/2021 9:00 A M Medical Record Number: [id number] Patient Account Number: [id number] Date of Birth/Sex: Treating RN: 1950-08-29 (71 y.o. [name] Primary Care Benjimen Kelley: [name] Other Clinician: Referring Abdulkarim Eberlin: Treating Lambros Cerro/Extender: Cornelious Bryant in Treatment: 15 Compression Therapy Performed for Wound Assessment: Wound #22 Left,Lateral Lower Leg Performed By: Clinician Deon Pilling, RN Compression Type: Double Layer Electronic Signature(s) Signed: 07/31/2021 4:47:34 PM By: Deon Pilling RN, BSN Entered By: Deon Pilling on 07/31/2021 09:36:54 -------------------------------------------------------------------------------- Encounter  Discharge Information Details Patient Name: Date of Service: [name] Garfinkel. 07/31/2021 9:00 A M Medical Record Number: [id number] Patient Account Number: [id number] Date of Birth/Sex: Treating RN: 04-19-1951 (71 y.o. [name] Primary Care Luccia Reinheimer: [name] Other Clinician: Referring Almin Livingstone: Treating Wayne Wicklund/Extender: Cornelious Bryant in Treatment: 15 Encounter Discharge Information Items Discharge Condition: Stable Ambulatory Status: Ambulatory Discharge Destination: Home Transportation: Private Auto Accompanied By: self Schedule Follow-up Appointment: Yes Clinical Summary of Care: Electronic Signature(s) Signed: 07/31/2021 4:47:34 PM By: Deon Pilling RN, BSN Entered By: Deon Pilling on 07/31/2021 09:39:36 -------------------------------------------------------------------------------- Negative Pressure Wound Therapy Maintenance (  NPWT) Details Patient Name: Date of Service: [name], [name] 07/31/2021 9:00 A M Medical Record Number: [id number] Patient Account Number: [id number] Date of Birth/Sex: Treating RN: [birth year] (71 y.o. [name] Primary Care Christel Bai: [name] Other Clinician: Referring Hedaya Latendresse: Treating Daniele Yankowski/Extender: Cornelious Bryant in Treatment: 15 NPWT Maintenance Performed for: Wound #22 Left, Lateral Lower Leg Additional Injuries Covered: No Performed By: Deon Pilling, RN Type: VAC System Coverage Size (sq cm): 6.16 Pressure Type: Constant Pressure Setting: 125 mmHG Drain Type: None Primary Contact: Non-Adherent Sponge/Dressing Type: Combination : x1 gauze;x1 black foam Date Initiated: 07/27/2021 Dressing Removed: Yes Quantity of Sponges/Gauze Removed: x1 gauze and x2 black foam removed Canister Changed: Yes Canister Exudate Volume: 150 Dressing Reapplied: Yes Quantity of Sponges/Gauze Inserted: x1 gauze;x1 black foam Respones T Treatment: o tolerated well Days On NPWT :  5 Electronic Signature(s) Signed: 07/31/2021 4:47:34 PM By: Deon Pilling RN, BSN Entered By: Deon Pilling on 07/31/2021 09:45:35 -------------------------------------------------------------------------------- Patient/Caregiver Education Details Patient Name: Date of Service: [name], [name] 2/6/2023andnbsp9:00 A M Medical Record Number: [id number] Patient Account Number: [id number] Date of Birth/Gender: Treating RN: 01/30/1951 (71 y.o. [name] Primary Care Physician: [name] Other Clinician: Referring Physician: Treating Physician/Extender: Cornelious Bryant in Treatment: 15 Education Assessment Education Provided To: Patient Education Topics Provided Wound/Skin Impairment: Handouts: Skin Care Do's and Dont's Methods: Explain/Verbal Responses: Reinforcements needed Electronic Signature(s) Signed: 07/31/2021 4:47:34 PM By: Deon Pilling RN, BSN Entered By: Deon Pilling on 07/31/2021 09:39:15 -------------------------------------------------------------------------------- Wound Assessment Details Patient Name: Date of Service: [name] Garfinkel. 07/31/2021 9:00 A M Medical Record Number: [id number] Patient Account Number: [id number] Date of Birth/Sex: Treating RN: Jun 08, 1951 (71 y.o. [name], [name] Primary Care Rolena Knutson: [name] Other Clinician: Referring Roberts Bon: Treating Aamira Bischoff/Extender: Cornelious Bryant in Treatment: 15 Wound Status Wound Number: 18 Primary Etiology: Diabetic Wound/Ulcer of the Lower Extremity Wound Location: Left, Plantar Metatarsal head first Wound Status: Open Wounding Event: Gradually Appeared Date Acquired: 08/23/2020 Weeks Of Treatment: 15 Clustered Wound: No Wound Measurements Length: (cm) 1.5 Width: (cm) 2.2 Depth: (cm) 0.1 Area: (cm) 2.592 Volume: (cm) 0.259 % Reduction in Area: 81.7% % Reduction in Volume: 90.8% Wound Description Classification: Grade 2 Exudate Amount:  Medium Exudate Type: Serosanguineous Exudate Color: red, brown Treatment Notes Wound #18 (Metatarsal head first) Wound Laterality: Plantar, Left Cleanser Soap and Water Discharge Instruction: May shower and wash wound with dial antibacterial soap and water prior to dressing change. Wound Cleanser Discharge Instruction: Cleanse the wound with wound cleanser prior to applying a clean dressing using gauze sponges, not tissue or cotton balls. Peri-Wound Care Triamcinolone 15 (g) Discharge Instruction: Use triamcinolone 15 (g) as directed Sween Lotion (Moisturizing lotion) Discharge Instruction: Apply moisturizing lotion as directed Topical Primary Dressing Hydrofera Blue Ready Foam, 2.5 x2.5 in Discharge Instruction: Apply to wound bed as instructed Secondary Dressing ABD Pad, 5x9 Discharge Instruction: Apply over primary dressing as directed. Secured With Compression Wrap CoFlex TLC XL 2-layer Compression System 4x7 (in/yd) Discharge Instruction: Apply CoFlex 2-layer compression as directed. (alt for 4 layer) Compression Stockings Add-Ons Electronic Signature(s) Signed: 07/31/2021 4:47:34 PM By: Deon Pilling RN, BSN Entered By: Deon Pilling on 07/31/2021 09:22:44 -------------------------------------------------------------------------------- Wound Assessment Details Patient Name: Date of Service: [name] Garfinkel. 07/31/2021 9:00 A M Medical Record Number: [id number] Patient Account Number: [id number] Date of Birth/Sex: Treating RN: 01/23/1951 (71 y.o. [name] Primary Care Stacia Feazell: [name] Other Clinician: Referring Cherylee Rawlinson: Treating Sherita Decoste/Extender: Kalman Shan  [name] Weeks in Treatment: 15 Wound Status Wound Number: 19 Primary Etiology: Diabetic Wound/Ulcer of the Lower Extremity Wound Location: Left, Anterior Lower Leg Wound Status: Open Wounding Event: Gradually Appeared Date Acquired: 08/23/2020 Weeks Of Treatment: 15 Clustered Wound:  No Wound Measurements Length: (cm) 0.1 Width: (cm) 0.1 Depth: (cm) 0.1 Area: (cm) 0.008 Volume: (cm) 0.001 % Reduction in Area: 99.9% % Reduction in Volume: 100% Wound Description Classification: Grade 2 Exudate Amount: Small Exudate Type: Serous Exudate Color: amber Treatment Notes Wound #19 (Lower Leg) Wound Laterality: Left, Anterior Cleanser Soap and Water Discharge Instruction: May shower and wash wound with dial antibacterial soap and water prior to dressing change. Wound Cleanser Discharge Instruction: Cleanse the wound with wound cleanser prior to applying a clean dressing using gauze sponges, not tissue or cotton balls. Peri-Wound Care Triamcinolone 15 (g) Discharge Instruction: Use triamcinolone 15 (g) as directed Sween Lotion (Moisturizing lotion) Discharge Instruction: Apply moisturizing lotion as directed Topical Primary Dressing Hydrofera Blue Ready Foam, 2.5 x2.5 in Discharge Instruction: Apply to wound bed as instructed Secondary Dressing Woven Gauze Sponge, Non-Sterile 4x4 in Discharge Instruction: Apply over primary dressing as directed. Secured With Compression Wrap CoFlex TLC XL 2-layer Compression System 4x7 (in/yd) Discharge Instruction: Apply CoFlex 2-layer compression as directed. (alt for 4 layer) Compression Stockings Add-Ons Electronic Signature(s) Signed: 07/31/2021 4:47:34 PM By: Deon Pilling RN, BSN Entered By: Deon Pilling on 07/31/2021 09:22:44 -------------------------------------------------------------------------------- Wound Assessment Details Patient Name: Date of Service: [name] Garfinkel. 07/31/2021 9:00 A M Medical Record Number: [id number] Patient Account Number: [id number] Date of Birth/Sex: Treating RN: 1951-01-30 (71 y.o. [name], [name] Primary Care Sundi Slevin: [name] Other Clinician: Referring Carmina Walle: Treating Angelin Cutrone/Extender: Cornelious Bryant in Treatment: 15 Wound Status Wound Number:  22 Primary Etiology: Cyst Wound Location: Left, Lateral Lower Leg Wound Status: Open Wounding Event: Bump Date Acquired: 06/03/2021 Weeks Of Treatment: 8 Clustered Wound: No Wound Measurements Length: (cm) 2.8 Width: (cm) 2.2 Depth: (cm) 0.8 Area: (cm) 4.838 Volume: (cm) 3.87 % Reduction in Area: -193.4% % Reduction in Volume: -193.4% Wound Description Classification: Full Thickness With Exposed Support Structure Exudate Amount: Large Exudate Type: Purulent Exudate Color: yellow, brown, green s Treatment Notes Wound #22 (Lower Leg) Wound Laterality: Left, Lateral Cleanser Soap and Water Discharge Instruction: May shower and wash wound with dial antibacterial soap and water prior to dressing change. Wound Cleanser Discharge Instruction: Cleanse the wound with wound cleanser prior to applying a clean dressing using gauze sponges, not tissue or cotton balls. Peri-Wound Care Triamcinolone 15 (g) Discharge Instruction: Use triamcinolone 15 (g) as directed Sween Lotion (Moisturizing lotion) Discharge Instruction: Apply moisturizing lotion as directed Topical Primary Dressing VAC Secondary Dressing Secured With Compression Wrap CoFlex TLC XL 2-layer Compression System 4x7 (in/yd) Discharge Instruction: Apply CoFlex 2-layer compression as directed. (alt for 4 layer) Compression Stockings Add-Ons Electronic Signature(s) Signed: 07/31/2021 4:47:34 PM By: Deon Pilling RN, BSN Entered By: Deon Pilling on 07/31/2021 09:22:44 -------------------------------------------------------------------------------- Wound Assessment Details Patient Name: Date of Service: [name] Garfinkel. 07/31/2021 9:00 A M Medical Record Number: [id number] Patient Account Number: [id number] Date of Birth/Sex: Treating RN: 1950-10-22 (71 y.o. [name] Primary Care Coston Mandato: [name] Other Clinician: Referring Danella Philson: Treating Geron Mulford/Extender: Melina Copa Weeks in  Treatment: 15 Wound Status Wound Number: 23 Primary Etiology: Cyst Wound Location: Left, Lateral Upper Leg Wound Status: Open Wounding Event: Laceration Date Acquired: 06/05/2021 Weeks Of Treatment: 1 Clustered Wound: No Wound Measurements Length: (cm) 0.3 Width: (cm) 0.3  Depth: (cm) 0.4 Area: (cm) 0.071 Volume: (cm) 0.028 % Reduction in Area: 66.5% % Reduction in Volume: 83.5% Wound Description Classification: Full Thickness Without Exposed Support Structu Exudate Amount: Small Exudate Type: Serosanguineous Exudate Color: red, brown res Treatment Notes Wound #23 (Upper Leg) Wound Laterality: Left, Lateral Cleanser Peri-Wound Care Triamcinolone 15 (g) Discharge Instruction: Use triamcinolone 15 (g) as directed Sween Lotion (Moisturizing lotion) Discharge Instruction: Apply moisturizing lotion as directed Topical Primary Dressing Secondary Dressing Zetuvit Plus Silicone Border Dressing 7x7(in/in) Discharge Instruction: Apply silicone border over primary dressing as directed. Secured With Elastic Bandage 4 inch (ACE bandage) Discharge Instruction: Secure with ACE bandage as directed. Compression Wrap Compression Stockings Add-Ons Electronic Signature(s) Signed: 07/31/2021 4:47:34 PM By: Deon Pilling RN, BSN Entered By: Deon Pilling on 07/31/2021 09:22:44 -------------------------------------------------------------------------------- Vitals Details Patient Name: Date of Service: [name] Garfinkel. 07/31/2021 9:00 A M Medical Record Number: [id number] Patient Account Number: [id number] Date of Birth/Sex: Treating RN: 04/26/1951 (71 y.o. [name] Primary Care Tracye Szuch: [name] Other Clinician: Referring Aulton Routt: Treating Joyous Gleghorn/Extender: Cornelious Bryant in Treatment: 15 Vital Signs Time Taken: 09:13 Temperature (F): 97.9 Height (in): 74 Pulse (bpm): 72 Weight (lbs): 238 Respiratory Rate (breaths/min): 20 Body Mass Index  (BMI): 30.6 Blood Pressure (mmHg): 186/90 Reference Range: 80 - 120 mg / dl Electronic Signature(s) Signed: 07/31/2021 4:47:34 PM By: Deon Pilling RN, BSN Entered By: Deon Pilling on 07/31/2021 09:18:18

## 2021-08-01 ENCOUNTER — Encounter: Payer: Self-pay | Admitting: Nurse Practitioner

## 2021-08-01 ENCOUNTER — Ambulatory Visit (INDEPENDENT_AMBULATORY_CARE_PROVIDER_SITE_OTHER): Payer: Medicare Other | Admitting: Nurse Practitioner

## 2021-08-01 VITALS — BP 103/66 | HR 77 | Ht 73.5 in | Wt 266.6 lb

## 2021-08-01 DIAGNOSIS — I10 Essential (primary) hypertension: Secondary | ICD-10-CM | POA: Diagnosis not present

## 2021-08-01 DIAGNOSIS — E1165 Type 2 diabetes mellitus with hyperglycemia: Secondary | ICD-10-CM

## 2021-08-01 DIAGNOSIS — E782 Mixed hyperlipidemia: Secondary | ICD-10-CM | POA: Diagnosis not present

## 2021-08-01 DIAGNOSIS — Z794 Long term (current) use of insulin: Secondary | ICD-10-CM

## 2021-08-01 NOTE — Patient Instructions (Signed)
Diabetes Mellitus Emergency Preparedness Plan °A diabetes emergency preparedness plan is a checklist to make sure you have everything you need to manage your diabetes in case of an emergency, such as an evacuation, natural disaster, national security emergency, or pandemic lockdown. °Managing your diabetes is something you have to do all day every day. The American Diabetes Association and the American College of Endocrinology both recommend putting together an emergency diabetes kit. Your kit should include important information and documents as well as all the supplies you will need to manage your diabetes for at least 1 week. Store it in a portable, waterproof bag or container. The best time to start making your emergency kit is now. °How to make your emergency kit °Collect information and documents °Include the following information and documents in your kit: °The type of diabetes you have. °A copy of your health insurance cards and photo ID. °A list of all your other medical conditions, allergies, and surgeries. °A list of all your medicines and doses with the contact information for your pharmacy. Ask your health care provider for a list of your current medicines. °Any recent lab results, including your latest hemoglobin A1C (HbA1C). °The make, model, and serial number of your insulin pump, if you use one. Also include contact information for the manufacturer. °Contact information for people who should be notified in case of an emergency. Include your health care provider's name, address, and phone number. °Collect diabetes care items °Include the following diabetes care items in your kit: °At least a 1-week supply of: °Oral medicines. °Insulin. °Blood glucose testing supplies. These include testing strips, lancets, and extra batteries for your blood glucose monitor and pump. °A charger for the continuous glucose monitor (CGM) receiver and pump. °Any extra supplies needed for your CGM or pump. °A supply of  glucagon, glucose tablets, juice, soda, or hard candy in case of hypoglycemia. °Coolers or cold packs. °A safe container for syringes, needles, and lancets. ° °Other preparations °Other things to consider doing as part of your emergency plan: °Make sure that your mobile phone is charged and that you have an extra charger, cable, or batteries. °Choose a meeting place for family members. °Wear a medical alert or ID bracelet. °If you have a child with diabetes, make sure your child's school has a copy of his or her emergency plan, including the name of the staff member who will assist your child. °Where to find more information °American Diabetes Association: www.diabetes.org °Centers for Disease Control and Prevention: blogs.cdc.gov °Summary °A diabetes emergency preparedness plan is a checklist to make sure you have everything you need in case of an emergency. °Your kit should include important information and documents as well as all the supplies you will need to manage your condition for at least 1 week. °Store your kit in a portable, waterproof bag or container. °The best time to start making your emergency kit is now. °This information is not intended to replace advice given to you by your health care provider. Make sure you discuss any questions you have with your health care provider. °Document Revised: 12/17/2019 Document Reviewed: 12/17/2019 °[employer]vier Patient Education © 2022 [employer] ° °
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
## 2021-08-03 ENCOUNTER — Other Ambulatory Visit: Payer: Self-pay

## 2021-08-03 ENCOUNTER — Encounter (HOSPITAL_BASED_OUTPATIENT_CLINIC_OR_DEPARTMENT_OTHER): Payer: Medicare Other | Admitting: Internal Medicine

## 2021-08-03 DIAGNOSIS — E11621 Type 2 diabetes mellitus with foot ulcer: Secondary | ICD-10-CM | POA: Diagnosis not present

## 2021-08-03 NOTE — Progress Notes (Signed)
[name] ([id number]) Visit Report for 08/03/2021 Debridement Details Patient Name: Date of Service: [name] 08/03/2021 10:45 A M Medical Record Number: [id number] Patient Account Number: [id number] Date of Birth/Sex: Treating RN: 07-01-1950 (71 y.o. [name] Primary Care Provider: [name] Other Clinician: Referring Provider: Treating Provider/Extender: Stormy Card in Treatment: 16 Debridement Performed for Assessment: Wound #18 Left,Plantar Metatarsal head first Performed By: Physician Ricard Dillon., MD Debridement Type: Debridement Severity of Tissue Pre Debridement: Fat layer exposed Level of Consciousness (Pre-procedure): Awake and Alert Pre-procedure Verification/Time Out Yes - 11:25 Taken: Start Time: 11:25 T Area Debrided (L x W): otal 2 (cm) x 2 (cm) = 4 (cm) Tissue and other material debrided: Viable, Non-Viable, Callus, Subcutaneous, Skin: Epidermis Level: Skin/Subcutaneous Tissue Debridement Description: Excisional Instrument: Curette Bleeding: Minimum Hemostasis Achieved: Pressure Procedural Pain: 0 Post Procedural Pain: 0 Response to Treatment: Procedure was tolerated well Level of Consciousness (Post- Awake and Alert procedure): Post Debridement Measurements of Total Wound Length: (cm) 0.9 Width: (cm) 1.7 Depth: (cm) 0.1 Volume: (cm) 0.12 Character of Wound/Ulcer Post Debridement: Improved Severity of Tissue Post Debridement: Fat layer exposed Post Procedure Diagnosis Same as Pre-procedure Electronic Signature(s) Signed: 08/03/2021 5:29:10 PM By: Linton Ham MD Signed: 08/03/2021 6:05:40 PM By: Levan Hurst RN, BSN Entered By: Linton Ham on 08/03/2021 11:43:34 -------------------------------------------------------------------------------- HPI Details Patient Name: Date of Service: [name]. 08/03/2021 10:45 A M Medical Record Number: [id number] Patient Account Number: [id number] Date of  Birth/Sex: Treating RN: 1951-03-31 (71 y.o. [name] Primary Care Provider: [name] Other Clinician: Referring Provider: Treating Provider/Extender: Stormy Card in Treatment: 16 History of Present Illness HPI Description: 10/11/17; [name] is a 72 year old man who tells me that in 2015 he slipped down the latter traumatizing his left leg. He developed a wound in the same spot the area that we are currently looking at. He states this closed over for the most part although he always felt it was somewhat unstable. In 2016 he hit the same area with the door of his car had this reopened. He tells me that this is never really closed although sometimes an inflow it remains open on a constant basis. He has not been using any specific dressing to this except for topical antibiotics the nature of which were not really sure. His primary doctor did send him to see Dr. [name] of interventional cardiology. He underwent an angiogram on 08/06/17 and he underwent a PTA and directional atherectomy of the lesser distal SFA and popliteal arteries which resulted in brisk improvement in blood flow. It was noted that he had 2 vessel runoff through the anterior tibial and peroneal. He is also been to see vascular and interventional radiologist. He was not felt to have any significant superficial venous insufficiency. Presumably is not a candidate for any ablation. It was suggested he come here for wound care. The patient is a type II diabetic on insulin. He also has a history of venous insufficiency. ABIs on the left were noncompressible in our clinic 10/21/17; patient we admitted to the clinic last week. 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 We put Iodosorb on him after an aggressive debridement and 3 layer compression. He complained of pain in his ankle and itching with is skin in fact he scratched the area on the  medial calf superiorly at the rim of our wraps and he  has 2 small open areas in that location today which are new. I changed his primary dressing today to silver collagen. As noted he is already had revascularization and does not have any significant superficial venous insufficiency that would be amenable to ablation 10/28/17; patient admitted to the clinic 2 weeks ago. He has a smaller Wound. Scratch injury from last week revealed. There is large wound over the tibial area. This is smaller. Granulation looks healthy. No need for debridement. 11/04/17; the wound on the left lateral calf looks better. Improved dimensions. Surface of this looks better. We've been maintaining him and Kerlix Coban wraps. He finds this much more comfortable. Silver collagen dressing 11/11/17; left lateral Wound continues to look healthy be making progress. Using a #5 curet I removed removed nonviable skin from the surface of the wound and then necrotic debris from the wound surface. Surface of the wound continues to look healthy. He also has an open area on the left great toenail bed. We've been using topical antibiotics. 11/19/17; left anterior lateral wound continues to look healthy but it's not closed. He also had a small wound above this on the left leg Initially traumatic wounds in the setting of significant chronic venous insufficiency and stasis dermatitis 11/25/17; left anterior wounds superiorly is closed still a small wound inferiorly. 12/02/17; left anterior tibial area. Arrives today with adherent callus. Post debridement clearly not completely closed. Hydrofera Blue under 3 layer compression. 12/09/17; left anterior tibia. Circumferential eschar however the wound bed looks stable to improved. We've been using Hydrofera Blue under 3 layer compression 12/17/17; left anterior tibia. Apparently this was felt to be closed however when the wrap was taken off there is a skin tear to reopen wounds in the same area we've been  using Hydrofera Blue under 3 layer compression 12/23/17 left anterior tibia. Not close to close this week apparently the [facility] was stuck to this again. Still circumferential eschar requiring debridement. I put a contact layer on this this time under the Hydrofera Blue 12/31/17; left anterior tibia. Wound is better slight amount of hyper-granulation. Using Hydrofera Blue over Adaptic. 01/07/18; left anterior tibia. The wound had some surface eschar however after this was removed he has no open wound.he was already revascularized by Dr. [name] when he came to our clinic with atherectomy of the left SFA and popliteal artery. He was also sent to interventional radiology for venous reflux studies. He was not felt to have significant reflux but certainly has chronic venous changes of his skin with hemosiderin deposition around this area. He will definitely need to lubricate his skin and wear compression stocking and I've talked to him about this. READMISSION 05/26/2018 This is a now 71 year old man we cared for with traumatic wounds on his left anterior lower extremity. He had been previously revascularized during that admission by Dr. [name]. Apparently in follow-up Dr. [name] noted that he had deterioration in his arterial status. He underwent a stent placement in the distal left SFA on 04/22/2018. 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 He went back to the angiography suite on 04/30/2018 he underwent PTA and balloon angioplasty of the occluded left mid anterior tibial artery, thrombotic occlusion went from 100 to 0% which reconstitutes the posterior tibial artery. He had thrombectomy and aspiration of the peroneal artery. The stent placed in the distal SFA left SFA was still occluded. He was discharged on Xarelto, it was noted on the discharge summary from this hospitalization that he had gangrene at the tip  of his left fifth toe and there were expectations this would auto amputate.  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 He has been recuperating at [place] [facility] in [facility] after the most recent hospitalization. He is going home tomorrow. He tells me that 2 weeks ago he traumatized the tip of his left fifth toe. He came in urgently for our review of this. This was a history of before I noted that Dr. [name] had already noted dry gangrenous changes of the left fifth toe 06/09/2018; 2-week follow-up. I did contact Dr. [name] after his last appointment and he apparently saw 1 of Dr. Irven Shelling colleagues the next day. He does not follow-up with Dr. [name] himself until Thursday of this week. 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 Nevertheless there is no evidence of infection no drainage and no pain. He had a new area that this week when we were signing him in today on the left anterior mid tibia area, this is in close proximity to the previous wound we have dealt with in this clinic. 06/23/2018; 2-week follow-up. I did not receive a recent note from Dr. [name] to review today. Our office is trying to obtain this. He is apparently not planning to do further vascular interventions and wondered about compression to try and help with the patient's chronic venous insufficiency. However we are also concerned about the arterial flow. He arrives in clinic today with a new area on the left third toe. The areas on the calf/anterior tibia are close to closing. The left fifth toe is still mummified using Betadine. -In reviewing things with the patient he has what sounds like claudication with mild to moderate amount of activity. 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 I prescribed Levaquin over the phone while we attempted to arrange a plan of care. However the patient called yesterday to report he had low-grade fever and he came in today acutely. There is been a marked  deterioration in the left third toe with spreading cellulitis up into the dorsal left foot. He was referred to the emergency room. Readmission: 06/29/2020 patient presents today for reevaluation here in our clinic he was previously treated by Dr. Dellia Nims at the latter part of 2019 in 2 the beginning of 2020. Subsequently we have not seen him since that time in the interim he did have evaluation with vein and vascular specialist specifically Dr. Anice Paganini who did perform quite extensive work for a left femoral to anterior tibial artery bypass. With that being said in the interim the patient has developed significant lymphedema and has wounds that he tells me have really never healed in regard to the incision site on the left leg. He also has multiple wounds on the feet for various reasons some of which is that he tends to pick at his feet. Fortunately there is no signs of active infection systemically at this time he does have some wounds that are little bit deeper but most are fairly superficial he seems to have good blood flow and overall everything appears to be healthy I see no bone exposed and no obvious signs of osteomyelitis. I do not know that he necessarily needs a x-ray at this point although that something we could consider depending on how things progress. The patient does have a history of lymphedema, diabetes, this is type II, chronic kidney disease stage  III, hypertension, and history of peripheral vascular disease. 07/05/2020; patient admitted last week. Is a patient I remember from 2019 he had a spreading infection involving the left foot and we sent him to the hospital. 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 He subsequently had a left femoral to anterior tibial bypass by Dr.Cain vein and vascular. He also has severe lymphedema with chronic skin changes related to that on the left leg. The most problematic area that was new today was on the  left medial great toe. This was apparently a small area last week there was purulent drainage which our intake nurse cultured. Also areas on the left medial foot and heel left lateral foot. He has 2 areas on the left medial calf left lateral calf in the setting of the severe lymphedema. 07/13/2020 on evaluation today patient appears to be doing better in my opinion compared to his last visit. The good news is there is no signs of active infection systemically and locally I do not see any signs of infection either. He did have an x-ray which was negative that is great news he had a culture which showed MRSA but at the same time he is been on the doxycycline which has helped. I do think we may want to extend this for 7 additional days 1/25; patient admitted to the clinic a few weeks ago. 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 We have been putting him under compression his edema control is a lot better but he is severe verricused skin on the left leg. He is really done quite well he still has an open area on the left medial calf and the left medial first metatarsal head. We have been using silver collagen on the leg silver alginate on the foot 07/27/2020 upon evaluation today patient appears to be doing decently well in regard to his wounds. He still has a lot of dry skin on the left leg. Some of this is starting to peel back and I think he may be able to have them out by removing some that today. Fortunately there is no signs of active infection at this time on the left leg although on the right leg he does appear to have swelling and erythema as well as some mild warmth to touch. This does have been concerned about the possibility of cellulitis although within the differential diagnosis I do think that potentially a DVT has to be at least considered. We need to rule that out before proceeding would just call in the cellulitis. Especially since he is having  pain in the posterior aspect of his calf muscle. 2/8; the patient had seen sparingly. He has severe skin changes of chronic lymphedema in the left leg thickened hyperkeratotic verrucous skin. He has an open wound on the medial part of the left first met head left mid tibia. He also has a rim of nonepithelialized skin in the anterior mid tibia. He brought in the AmLactin lotion that was been prescribed although I am not sure under compression and its utility. There concern about cellulitis on the right lower leg the last time he was here. He was put on on antibiotics. His DVT rule out was negative. The right leg looks fine he is using his stocking on this area 08/10/2020 upon evaluation today patient appears to be doing well with regard to his leg currently. He has been tolerating the dressing  changes without complication. Fortunately there is no signs of active infection which is great news. Overall very pleased with where things stand. 2/22; the patient still has an area on the medial part of the left first met his head. This looks better than when I last saw this earlier this month he has a rim of epithelialization but still some surface debris. Mostly everything on the left leg is healed. There is still a vulnerable in the left mid tibia area. 08/30/2020 upon evaluation today patient appears to be doing much better in regard to his wounds on his foot. 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 Nonetheless I think we will address that today I Minna send in a prescription for him in that regard. Overall though there does not appear to be any signs of significant worsening. 09/07/2020 on evaluation today patient's wounds over his left foot appear to be doing excellent. I do not see any signs of infection there is some callus buildup this can require debridement for  certain but overall I feel like he is managing quite nicely. He still using the AmLactin cream which has been beneficial for him as well. 3/22; left foot wound is closed. There is no open area here. He is using ammonium lactate lotion to the lower extremities to help exfoliate dry cracked skin. He has compression stockings from elastic therapy in [place]. The wound on the medial part of his left first met head is healed today. READMISSION 04/12/2021 Mr. Popson is a patient we know fairly well he had a prolonged stay in clinic in 2019 with wounds on his left lateral and left anterior lower extremity in the setting of chronic venous insufficiency. More recently he was here earlier this year with predominantly an area on his left foot first metatarsal head plantar and he says the plantar foot broke down on its not long after we discharged him but he did not come back here. The last few months areas of broken down on his left anterior and again the left lateral lower extremity. The leg itself is very swollen chronically enlarged a lot of hyperkeratotic dry Berry Q skin in the left lower leg. His edema extends well into the thigh. He was seen by Dr. Donzetta Matters. He had ABIs on 03/02/2021 showing an ABI on the right of 1 with a TBI of 0.72 his ABI in the left at 1.09 TBI of 0.99. Monophasic and biphasic waveforms on the right. On the left monophasic waveforms were noted he went on to have an angiogram on 03/27/2021 this showed the aortic aortic and iliac segments were free of flow-limiting stenosis the left common femoral vein to evaluate the left femoral to anterior tibial artery bypass was unobstructed the bypass was patent without any areas of stenosis. We discharged the patient in bilateral juxta lite stockings but very clearly that was not sufficient to control the swelling and maintain skin integrity. He is clearly going to need compression pumps. The patient is a security guard at a [employer] but he is telling me  he is going to retire in 25 days. This is fortunate because he is on his feet for long periods of time. 10/27; patient comes in with our intake nurse reporting copious amount of green drainage from the left anterior mid tibia the left dorsal foot and to a lesser extent the left medial  mid tibia. We left the compression wrap on all week for the amount of edema in his left leg is quite a bit better. We use silver alginate as the primary dressing 11/3; edema control is good. Left anterior lower leg left medial lower leg and the plantar first metatarsal head. The left anterior lower leg required debridement. Deep tissue culture I did of this wound showed MRSA I put him on 10 days of doxycycline which she will start today. We have him in compression wraps. He has a security card and [employer] however he is retiring on November 15. We will need to then get him into a better offloading boot for the left foot perhaps a total contact cast 11/10; edema control is quite good. Left anterior and left medial lower leg wounds in the setting of chronic venous insufficiency and lymphedema. He also has a substantial area over the left plantar first metatarsal head. 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 He has significant hypergranulation on the left plantar foot wound. The patient is a diabetic but he does not have significant PAD 11/17; edema control is quite good. Left anterior and left medial lower leg wounds look better. The really concerning area remains the area on the left plantar first metatarsal head. He has a rim of epithelialization. 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 Starting today with a forefoot off loader but . possibly a total contact cast. 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 Arrives with a very significant odor from I think all of the wounds on the left leg and the left foot. Because of this I did not put a total contact cast on him today but will could still consider this. His wife was having cataract surgery which is the reason he missed the appointment 12/6. I saw this man 5 days ago with a swelling below the popliteal fossa. I thought he actually might have a Baker's cyst however the DVT rule out study that we could arrange right away was negative the technician told me this was not a ruptured Baker's cyst. We attempted to get this aspirated by under ultrasound guidance in interventional radiology however all they did was an ultrasound however it shows an extensive fluid collection 62 x 8 x 9.4 in the left thigh and left calf. The patient states he thinks this started 8 days ago or so but he really is not complaining of any pain, fever or systemic symptoms. He has not ha 12/20; after some difficulty I managed to get the patient into see Dr. Donzetta Matters. Eventually he was taken into the hospital and had a drain put in the fluid collection below his left knee posteriorly extending into the posterior thigh. He still has the drain in place. Culture of this showed moderate staff aureus few Morganella and few Klebsiella he is now on doxycycline and ciprofloxacin as suggested by infectious disease he is on this for a month. The drain will remain in place until it stops draining 12/29; he comes in today with the 1 wound on his left leg and the area on the left plantar first met head significantly smaller.  Both look healthy. He still has the drain in the left leg. He says he has to change this daily. Follows up with Dr. Donzetta Matters on January 11. 06/29/2021; the wounds that I am following on the left leg and left first met head continued to be quite healthy. However the area where his inferior  drain is in place had copious amounts of drainage which was green in color. The wound here is larger. Follows up with Dr. Gwenlyn Saran of vein and vascular his surgeon next week as well as infectious disease. He remains on ciprofloxacin and doxycycline. He is not complaining of excessive pain in either one of the drain areas 1/12; the patient saw vascular surgery and infectious disease. Vascular surgery has left the drain in place as there was still some notable drainage still see him back in 2 weeks. Dr. Velna Ochs stop the doxycycline and ciprofloxacin and I do not believe he follows up with them at this point. Culture I did last week showed both doxycycline resistant MRSA and Pseudomonas not sensitive to ciprofloxacin although only in rare titers 1/19; the patient's wound on the left anterior lower leg is just about healed. We have continued healing of the area that was medially on the left leg. Left first plantar metatarsal head continues to get smaller. The major problem here is his 2 drain sites 1 on the left upper calf and lateral thigh. There is purulent drainage still from the left lateral thigh. I gave him antibiotics last week but we still have recultured. He has the drain in the area I think this is eventually going to have to come out. I suspect there will be a connecting wound to heal here perhaps with improved VAc 1/26; the patient had his drain removed by vein and vascular on 1/25/. This was a large pocket of fluid in his left thigh that seem to tunnel into his left upper calf. 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 His mention his Penrose drain was removed today. He now has a tunneling wound on his left calf and left thigh. Both of these probe widely towards each other although I cannot really prove that they connect. Both wounds on his lower leg anteriorly are closed and his area over the first metatarsal head on his right foot continues to improve. We are using Hydrofera  Blue here. He also saw infectious disease culture of the abscess they noted was polymicrobial with MRSA, Morganella and Klebsiella he was treated with doxycycline and ciprofloxacin for 4 weeks ending on 07/03/2021. They did not recommend any further antibiotics. Notable that while he still had the Penrose drain in place last week he had purulent drainage coming out of the inferior IandD site this grew [facility], MRSA and Pseudomonas but there does not appear to be any active infection in this area today with the drain out and he is not systemically unwell 2/2; with regards to the drain sites the superior one on the thigh actually is closed down the one on the upper left lateral calf measures about 8 and half centimeters which is an improvement seems to be less prominent although still with a lot of drainage. The only remaining wound is over the first metatarsal head on the left foot and this looks to be continuing to improve with Hydrofera Blue. 2/9; the area on his plantar left foot continues to contract. Callus around the wound edge. The drain sites specifically have not come down in depth. We put the  wound VAC on Monday he changed the canister late last night our intake nurse reported a pocket of fluid perhaps caused by our compression wraps Electronic Signature(s) Signed: 08/03/2021 5:29:10 PM By: Linton Ham MD Entered By: Linton Ham on 08/03/2021 11:45:15 -------------------------------------------------------------------------------- Physical Exam Details Patient Name: Date of Service: [name]. 08/03/2021 10:45 A M Medical Record Number: [id number] Patient Account Number: [id number] Date of Birth/Sex: Treating RN: March 14, 1951 (71 y.o. [name] Primary Care Provider: [name] Other Clinician: Referring Provider: Treating Provider/Extender: Stormy Card in Treatment: 16 Constitutional Sitting or standing Blood Pressure is within target  range for patient.. Pulse regular and within target range for patient.Marland Kitchen Respirations regular, non-labored and within target range.. Temperature is normal and within the target range for the patient.Marland Kitchen Appears in no distress. Notes Wound exam; left anterior lower leg is closed. The area on his thigh which was the proximal drain site also seems to be closed. The drain site on his left anterior lower leg still probes superiorly 8 cm this has not come down at all. I used a #3 curette to remove callus and dry skin and from around the edge of the plantar foot wound. Also debris on the surface minimal bleeding controlled with direct pressure Electronic Signature(s) Signed: 08/03/2021 5:29:10 PM By: Linton Ham MD Entered By: Linton Ham on 08/03/2021 11:49:24 -------------------------------------------------------------------------------- Physician Orders Details Patient Name: Date of Service: [name]. 08/03/2021 10:45 A M Medical Record Number: [id number] Patient Account Number: [id number] Date of Birth/Sex: Treating RN: 10/02/1950 (71 y.o. [name] Primary Care Provider: [name] Other Clinician: Referring Provider: Treating Provider/Extender: Stormy Card in Treatment: 16 Verbal / Phone Orders: No Diagnosis Coding ICD-10 Coding Code Description E11.621 Type 2 diabetes mellitus with foot ulcer 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 I89.0 Lymphedema, not elsewhere classified I87.322 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verity 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 Dellia Nims on Thursday room  2 Return A Nurse Visit: - Monday for [facility] change Bathing/ Shower/ Hygiene May shower with protection but do not get wound dressing(s) wet. - Use a cast protector so you can shower without getting your wrap(s) wet Negative Presssure Wound Therapy Wound #22 Left,Lateral Lower Leg Wound Vac to wound continuously at 133m/hg pressure - change 2 times per week in clinic Black Foam Sterile Gauze Packing - in tunnel Edema Control - Lymphedema / SCD / Other Elevate legs to the level of the heart or above for 30 minutes daily and/or when sitting, a frequency of: - throughout the day Avoid standing for long periods of time. Patient to wear own compression stockings every day. - on right leg; Moisturize legs daily. - Ammonium LACTATE to BLE every day. Off-Loading Wedge shoe to: - left front foot offloader Wound Treatment Wound #18 - Metatarsal head first Wound Laterality: Plantar, Left Cleanser: Soap and Water 2 x Per Week/7 Days Discharge Instructions: May shower and wash wound with dial antibacterial soap and water prior to dressing change. Cleanser: Wound Cleanser 2 x Per Week/7 Days Discharge Instructions: Cleanse the wound with wound cleanser prior to applying a clean dressing using gauze sponges, not tissue or cotton balls. Peri-Wound Care: Triamcinolone  15 (g) 2 x Per Week/7 Days Discharge Instructions: Use triamcinolone 15 (g) as directed Peri-Wound Care: Sween Lotion (Moisturizing lotion) 2 x Per Week/7 Days Discharge Instructions: Apply moisturizing lotion as directed Prim Dressing: Hydrofera Blue Ready Foam, 2.5 x2.5 in 2 x Per Week/7 Days ary Discharge Instructions: Apply to wound bed as instructed Secondary Dressing: ABD Pad, 5x9 2 x Per Week/7 Days Discharge Instructions: Apply over primary dressing as directed. Compression Wrap: CoFlex TLC XL 2-layer Compression System 4x7 (in/yd) 2 x Per Week/7 Days Discharge Instructions: Apply CoFlex 2-layer compression as directed. (alt for 4  layer) Wound #19 - Lower Leg Wound Laterality: Left, Anterior Cleanser: Soap and Water 2 x Per Week/7 Days Discharge Instructions: May shower and wash wound with dial antibacterial soap and water prior to dressing change. Cleanser: Wound Cleanser 2 x Per Week/7 Days Discharge Instructions: Cleanse the wound with wound cleanser prior to applying a clean dressing using gauze sponges, not tissue or cotton balls. Peri-Wound Care: Triamcinolone 15 (g) 2 x Per Week/7 Days Discharge Instructions: Use triamcinolone 15 (g) as directed Peri-Wound Care: Sween Lotion (Moisturizing lotion) 2 x Per Week/7 Days Discharge Instructions: Apply moisturizing lotion as directed Prim Dressing: Hydrofera Blue Ready Foam, 2.5 x2.5 in ary 2 x Per Week/7 Days Discharge Instructions: Apply to wound bed as instructed Secondary Dressing: Woven Gauze Sponge, Non-Sterile 4x4 in 2 x Per Week/7 Days Discharge Instructions: Apply over primary dressing as directed. Compression Wrap: CoFlex TLC XL 2-layer Compression System 4x7 (in/yd) 2 x Per Week/7 Days Discharge Instructions: Apply CoFlex 2-layer compression as directed. (alt for 4 layer) Wound #22 - Lower Leg Wound Laterality: Left, Lateral Cleanser: Soap and Water 2 x Per Week/7 Days Discharge Instructions: May shower and wash wound with dial antibacterial soap and water prior to dressing change. Cleanser: Wound Cleanser 2 x Per Week/7 Days Discharge Instructions: Cleanse the wound with wound cleanser prior to applying a clean dressing using gauze sponges, not tissue or cotton balls. Peri-Wound Care: Triamcinolone 15 (g) 2 x Per Week/7 Days Discharge Instructions: Use triamcinolone 15 (g) as directed Peri-Wound Care: Sween Lotion (Moisturizing lotion) 2 x Per Week/7 Days Discharge Instructions: Apply moisturizing lotion as directed Prim Dressing: VAC ary 2 x Per Week/7 Days Compression Wrap: CoFlex TLC XL 2-layer Compression System 4x7 (in/yd) 2 x Per Week/7  Days Discharge Instructions: Apply CoFlex 2-layer compression as directed. (alt for 4 layer) Wound #23 - Upper Leg Wound Laterality: Left, Lateral Secondary Dressing: Zetuvit Plus Silicone Border Dressing 7x7(in/in) 2 x Per Week Discharge Instructions: Apply silicone border over primary dressing as directed. Electronic Signature(s) Signed: 08/03/2021 5:29:10 PM By: Linton Ham MD Signed: 08/03/2021 6:03:12 PM By: Baruch Gouty RN, BSN Entered By: Baruch Gouty on 08/03/2021 11:35:31 -------------------------------------------------------------------------------- Problem List Details Patient Name: Date of Service: [name]. 08/03/2021 10:45 A M Medical Record Number: [id number] Patient Account Number: [id number] Date of Birth/Sex: Treating RN: 1950/11/25 (71 y.o. [name] Primary Care Provider: [name] Other Clinician: Referring Provider: Treating Provider/Extender: Stormy Card in Treatment: 16 Active Problems ICD-10 Encounter Code Description Active Date MDM Diagnosis E11.621 Type 2 diabetes mellitus with foot ulcer 04/12/2021 No Yes 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 04/12/2021 No Yes I89.0 Lymphedema, not elsewhere classified 04/12/2021 No Yes I87.322 Chronic venous hypertension (idiopathic) with inflammation of left lower 04/12/2021 No Yes extremity 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 ulcer of other part of left lower leg with other specified 04/12/2021 No Yes severity L97.528  Non-pressure chronic ulcer of other part of left foot with other specified 04/12/2021 No Yes severity 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 07/20/2021 No Yes Inactive Problems ICD-10 Code Description Active Date Inactive Date E11.42 Type 2 diabetes mellitus with diabetic polyneuropathy 04/12/2021 04/12/2021 L02.416 Cutaneous abscess of left lower limb 06/13/2021  06/13/2021 Resolved Problems Electronic Signature(s) Signed: 08/03/2021 5:29:10 PM By: Linton Ham MD Entered By: Linton Ham on 08/03/2021 11:42:59 -------------------------------------------------------------------------------- Progress Note Details Patient Name: Date of Service: [name]. 08/03/2021 10:45 A M Medical Record Number: [id number] Patient Account Number: [id number] Date of Birth/Sex: Treating RN: Apr 19, 1951 (71 y.o. [name] Primary Care Provider: [name] Other Clinician: Referring Provider: Treating Provider/Extender: Stormy Card in Treatment: 16 Subjective History of Present Illness (HPI) 10/11/17; Mr. Hibler is a 71 year old man who tells me that in 2015 he slipped down the latter traumatizing his left leg. He developed a wound in the same spot the area that we are currently looking at. He states this closed over for the most part although he always felt it was somewhat unstable. In 2016 he hit the same area with the door of his car had this reopened. He tells me that this is never really closed although sometimes an inflow it remains open on a constant basis. He has not been using any specific dressing to this except for topical antibiotics the nature of which were not really sure. His primary doctor did send him to see Dr. [name] of interventional cardiology. He underwent an angiogram on 08/06/17 and he underwent a PTA and directional atherectomy of the lesser distal SFA and popliteal arteries which resulted in brisk improvement in blood flow. It was noted that he had 2 vessel runoff through the anterior tibial and peroneal. He is also been to see vascular and interventional radiologist. He was not felt to have any significant superficial venous insufficiency. Presumably is not a candidate for any ablation. It was suggested he come here for wound care. The patient is a type II diabetic on insulin. He also has a history of venous  insufficiency. ABIs on the left were noncompressible in our clinic 10/21/17; patient we admitted to the clinic last week. 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 We put Iodosorb on him after an aggressive debridement and 3 layer compression. He complained of pain in his ankle and itching with is skin in fact he scratched the area on the medial calf superiorly at the rim of our wraps and he has 2 small open areas in that location today which are new. I changed his primary dressing today to silver collagen. As noted he is already had revascularization and does not have any significant superficial venous insufficiency that would be amenable to ablation 10/28/17; patient admitted to the clinic 2 weeks ago. He has a smaller Wound. Scratch injury from last week revealed. There is large wound over the tibial area. This is smaller. Granulation looks healthy. No need for debridement. 11/04/17; the wound on the left lateral calf looks better. Improved dimensions. Surface of this looks better. We've been maintaining him and Kerlix Coban wraps. He finds this much more comfortable. Silver collagen dressing 11/11/17; left lateral Wound continues to look healthy be making progress. Using a #5 curet I removed removed nonviable skin from the surface of the wound and then necrotic debris from the wound surface. Surface of the wound  continues to look healthy. ooHe also has an open area on the left great toenail bed. We've been using topical antibiotics. 11/19/17; left anterior lateral wound continues to look healthy but it's not closed. ooHe also had a small wound above this on the left leg ooInitially traumatic wounds in the setting of significant chronic venous insufficiency and stasis dermatitis 11/25/17; left anterior wounds superiorly is closed still a small wound inferiorly. 12/02/17; left anterior tibial area. Arrives today with adherent callus.  Post debridement clearly not completely closed. Hydrofera Blue under 3 layer compression. 12/09/17; left anterior tibia. Circumferential eschar however the wound bed looks stable to improved. We've been using Hydrofera Blue under 3 layer compression 12/17/17; left anterior tibia. Apparently this was felt to be closed however when the wrap was taken off there is a skin tear to reopen wounds in the same area we've been using Hydrofera Blue under 3 layer compression 12/23/17 left anterior tibia. Not close to close this week apparently the [facility] was stuck to this again. Still circumferential eschar requiring debridement. I put a contact layer on this this time under the Hydrofera Blue 12/31/17; left anterior tibia. Wound is better slight amount of hyper-granulation. Using Hydrofera Blue over Adaptic. 01/07/18; left anterior tibia. The wound had some surface eschar however after this was removed he has no open wound.he was already revascularized by Dr. [name] when he came to our clinic with atherectomy of the left SFA and popliteal artery. He was also sent to interventional radiology for venous reflux studies. He was not felt to have significant reflux but certainly has chronic venous changes of his skin with hemosiderin deposition around this area. He will definitely need to lubricate his skin and wear compression stocking and I've talked to him about this. READMISSION 05/26/2018 This is a now 71 year old man we cared for with traumatic wounds on his left anterior lower extremity. He had been previously revascularized during that admission by Dr. [name]. Apparently in follow-up Dr. [name] noted that he had deterioration in his arterial status. He underwent a stent placement in the distal left SFA on 04/22/2018. 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 He went back to the angiography suite on 04/30/2018 he underwent PTA and balloon angioplasty of the occluded left mid anterior tibial  artery, thrombotic occlusion went from 100 to 0% which reconstitutes the posterior tibial artery. He had thrombectomy and aspiration of the peroneal artery. The stent placed in the distal SFA left SFA was still occluded. 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 He has been recuperating at [place] home in [facility] after the most recent hospitalization. He is going home tomorrow. He tells me that 2 weeks ago he traumatized the tip of his left fifth toe. He came in urgently for our review of this. This was a history of before I noted that Dr. [name] had already noted dry gangrenous changes of the left fifth toe 06/09/2018; 2-week follow-up. I did contact Dr. [name] after his last appointment and he apparently saw 1 of Dr. Irven Shelling colleagues the next day. He does not follow-up with Dr. [name] himself until Thursday of this week. 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 Nevertheless there is  no evidence of infection no drainage and no pain. He had a new area that this week when we were signing him in today on the left anterior mid tibia area, this is in close proximity to the previous wound we have dealt with in this clinic. 06/23/2018; 2-week follow-up. I did not receive a recent note from Dr. [name] to review today. Our office is trying to obtain this. He is apparently not planning to do further vascular interventions and wondered about compression to try and help with the patient's chronic venous insufficiency. However we are also concerned about the arterial flow. ooHe arrives in clinic today with a new area on the left third toe. The areas on the calf/anterior tibia are close to closing. The left fifth toe is still  mummified using Betadine. -In reviewing things with the patient he has what sounds like claudication with mild to moderate amount of activity. 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 I prescribed Levaquin over the phone while we attempted to arrange a plan of care. However the patient called yesterday to report he had low-grade fever and he came in today acutely. There is been a marked deterioration in the left third toe with spreading cellulitis up into the dorsal left foot. He was referred to the emergency room. Readmission: 06/29/2020 patient presents today for reevaluation here in our clinic he was previously treated by Dr. Dellia Nims at the latter part of 2019 in 2 the beginning of 2020. Subsequently we have not seen him since that time in the interim he did have evaluation with vein and vascular specialist specifically Dr. Anice Paganini who did perform quite extensive work for a left femoral to anterior tibial artery bypass. With that being said in the interim the patient has developed significant lymphedema and has wounds that he tells me have really never healed in regard to the incision site on the left leg. He also has multiple wounds on the feet for various reasons some of which is that he tends to pick at his feet. Fortunately there is no signs of active infection systemically at this time he does have some wounds that are little bit deeper but most are fairly superficial he seems to have good blood flow and overall everything appears to be healthy I see no bone exposed and no obvious signs of osteomyelitis. I do not know that he necessarily needs a x-ray at this point although that something we could consider depending on how things progress. The patient does have a history of lymphedema, diabetes, this is type II, chronic kidney disease stage III, hypertension, and history of peripheral vascular disease. 07/05/2020; patient admitted last week. Is a patient I  remember from 2019 he had a spreading infection involving the left foot and we sent him to the hospital. 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 He subsequently had a left femoral to anterior tibial bypass by Dr.Cain vein and vascular. He also has severe lymphedema with chronic skin changes related to that on the left leg. The most problematic area that was new today was on the left medial great toe. This was apparently a small area last week there was purulent drainage which our intake nurse cultured. Also areas on the left medial foot and heel left lateral foot. He has 2 areas on the left medial calf left lateral calf in the setting of the severe lymphedema. 07/13/2020 on evaluation today patient appears  to be doing better in my opinion compared to his last visit. The good news is there is no signs of active infection systemically and locally I do not see any signs of infection either. He did have an x-ray which was negative that is great news he had a culture which showed MRSA but at the same time he is been on the doxycycline which has helped. I do think we may want to extend this for 7 additional days 1/25; patient admitted to the clinic a few weeks ago. 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 We have been putting him under compression his edema control is a lot better but he is severe verricused skin on the left leg. He is really done quite well he still has an open area on the left medial calf and the left medial first metatarsal head. We have been using silver collagen on the leg silver alginate on the foot 07/27/2020 upon evaluation today patient appears to be doing decently well in regard to his wounds. He still has a lot of dry skin on the left leg. Some of this is starting to peel back and I think he may be able to have them out by removing some that today. Fortunately there is no signs of active infection  at this time on the left leg although on the right leg he does appear to have swelling and erythema as well as some mild warmth to touch. This does have been concerned about the possibility of cellulitis although within the differential diagnosis I do think that potentially a DVT has to be at least considered. We need to rule that out before proceeding would just call in the cellulitis. Especially since he is having pain in the posterior aspect of his calf muscle. 2/8; the patient had seen sparingly. He has severe skin changes of chronic lymphedema in the left leg thickened hyperkeratotic verrucous skin. He has an open wound on the medial part of the left first met head left mid tibia. He also has a rim of nonepithelialized skin in the anterior mid tibia. He brought in the AmLactin lotion that was been prescribed although I am not sure under compression and its utility. There concern about cellulitis on the right lower leg the last time he was here. He was put on on antibiotics. His DVT rule out was negative. The right leg looks fine he is using his stocking on this area 08/10/2020 upon evaluation today patient appears to be doing well with regard to his leg currently. He has been tolerating the dressing changes without complication. Fortunately there is no signs of active infection which is great news. Overall very pleased with where things stand. 2/22; the patient still has an area on the medial part of the left first met his head. This looks better than when I last saw this earlier this month he has a rim of epithelialization but still some surface debris. Mostly everything on the left leg is healed. There is still a vulnerable in the left mid tibia area. 08/30/2020 upon evaluation today patient appears to be doing much better in regard to his wounds on his foot. Fortunately there does not appear to be any signs of active infection systemically though locally we did culture this last week and it does  appear that he does have MRSA currently. Nonetheless I think we will address that today I Minna send in a prescription for him in that regard. Overall  though there does not appear to be any signs of significant worsening. 09/07/2020 on evaluation today patient's wounds over his left foot appear to be doing excellent. I do not see any signs of infection there is some callus buildup this can require debridement for certain but overall I feel like he is managing quite nicely. He still using the AmLactin cream which has been beneficial for him as well. 3/22; left foot wound is closed. There is no open area here. He is using ammonium lactate lotion to the lower extremities to help exfoliate dry cracked skin. He has compression stockings from [facility] therapy in [place]. The wound on the medial part of his left first met head is healed today. READMISSION 04/12/2021 Mr. Courter is a patient we know fairly well he had a prolonged stay in clinic in 2019 with wounds on his left lateral and left anterior lower extremity in the setting of chronic venous insufficiency. More recently he was here earlier this year with predominantly an area on his left foot first metatarsal head plantar and he says the plantar foot broke down on its not long after we discharged him but he did not come back here. The last few months areas of broken down on his left anterior and again the left lateral lower extremity. The leg itself is very swollen chronically enlarged a lot of hyperkeratotic dry Berry Q skin in the left lower leg. His edema extends well into the thigh. He was seen by Dr. Donzetta Matters. He had ABIs on 03/02/2021 showing an ABI on the right of 1 with a TBI of 0.72 his ABI in the left at 1.09 TBI of 0.99. Monophasic and biphasic waveforms on the right. On the left monophasic waveforms were noted he went on to have an angiogram on 03/27/2021 this showed the aortic aortic and iliac segments were free of flow-limiting stenosis the left  common femoral vein to evaluate the left femoral to anterior tibial artery bypass was unobstructed the bypass was patent without any areas of stenosis. We discharged the patient in bilateral juxta lite stockings but very clearly that was not sufficient to control the swelling and maintain skin integrity. He is clearly going to need compression pumps. 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 This is fortunate because he is on his feet for long periods of time. 10/27; patient comes in with our intake nurse reporting copious amount of green drainage from the left anterior mid tibia the left dorsal foot and to a lesser extent the left medial mid tibia. We left the compression wrap on all week for the amount of edema in his left leg is quite a bit better. We use silver alginate as the primary dressing 11/3; edema control is good. Left anterior lower leg left medial lower leg and the plantar first metatarsal head. The left anterior lower leg required debridement. Deep tissue culture I did of this wound showed MRSA I put him on 10 days of doxycycline which she will start today. We have him in compression wraps. He has a security card and [employer] however he is retiring on November 15. We will need to then get him into a better offloading boot for the left foot perhaps a total contact cast 11/10; edema control is quite good. Left anterior and left medial lower leg wounds in the setting of chronic venous insufficiency and lymphedema. He also has a substantial area over the  left plantar first metatarsal head. 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 He has significant hypergranulation on the left plantar foot wound. The patient is a diabetic but he does not have significant PAD 11/17; edema control is quite good. Left anterior and left medial lower leg wounds look  better. The really concerning area remains the area on the left plantar first metatarsal head. He has a rim of epithelialization. 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 Starting today with a forefoot off loader but . possibly a total contact cast. 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 Arrives with a very significant odor from I think all of the wounds on the left leg and the left foot. Because of this I did not put a total contact cast on him today but will could still consider this. His wife was having cataract surgery which is the reason he missed the appointment 12/6. I saw this man 5 days ago with a swelling below the popliteal fossa. I thought he actually might have a Baker's cyst however the DVT rule out study that we could arrange right away was negative the technician told me this was not a ruptured Baker's cyst. We attempted to get this aspirated by under ultrasound guidance in interventional radiology however all they did was an ultrasound however it shows an extensive fluid collection 62 x 8 x 9.4 in the left thigh and left calf. The patient states he thinks this started 8 days ago or so but he really is not complaining of any pain, fever or systemic symptoms. He has not ha 12/20; after some difficulty I managed to get the patient into see Dr. Donzetta Matters. Eventually he was taken into the hospital and had a drain put in the fluid collection below his left knee posteriorly extending into the posterior thigh. He still has the drain in place. Culture of this showed moderate staff aureus few Morganella and few Klebsiella he is now on doxycycline and ciprofloxacin as suggested by infectious disease he is on this for a month. The drain will remain in place until it stops  draining 12/29; he comes in today with the 1 wound on his left leg and the area on the left plantar first met head significantly smaller. Both look healthy. He still has the drain in the left leg. He says he has to change this daily. Follows up with Dr. Donzetta Matters on January 11. 06/29/2021; the wounds that I am following on the left leg and left first met head continued to be quite healthy. However the area where his inferior drain is in place had copious amounts of drainage which was green in color. The wound here is larger. Follows up with Dr. Gwenlyn Saran of vein and vascular his surgeon next week as well as infectious disease. He remains on ciprofloxacin and doxycycline. He is not complaining of excessive pain in either one of the drain areas 1/12; the patient saw vascular surgery and infectious disease. Vascular surgery has left the drain in place as there was still some notable drainage still see him back in 2 weeks. Dr. Velna Ochs stop the doxycycline and ciprofloxacin and I  do not believe he follows up with them at this point. Culture I did last week showed both doxycycline resistant MRSA and Pseudomonas not sensitive to ciprofloxacin although only in rare titers 1/19; the patient's wound on the left anterior lower leg is just about healed. We have continued healing of the area that was medially on the left leg. Left first plantar metatarsal head continues to get smaller. The major problem here is his 2 drain sites 1 on the left upper calf and lateral thigh. There is purulent drainage still from the left lateral thigh. I gave him antibiotics last week but we still have recultured. He has the drain in the area I think this is eventually going to have to come out. I suspect there will be a connecting wound to heal here perhaps with improved VAc 1/26; the patient had his drain removed by vein and vascular on 1/25/. This was a large pocket of fluid in his left thigh that seem to tunnel into his left upper calf. He  had a previous left SFA to anterior tibial artery bypass. His mention his Penrose drain was removed today. He now has a tunneling wound on his left calf and left thigh. Both of these probe widely towards each other although I cannot really prove that they connect. Both wounds on his lower leg anteriorly are closed and his area over the first metatarsal head on his right foot continues to improve. We are using Hydrofera Blue here. He also saw infectious disease culture of the abscess they noted was polymicrobial with MRSA, Morganella and Klebsiella he was treated with doxycycline and ciprofloxacin for 4 weeks ending on 07/03/2021. They did not recommend any further antibiotics. Notable that while he still had the Penrose drain in place last week he had purulent drainage coming out of the inferior IandD site this grew [facility], MRSA and Pseudomonas but there does not appear to be any active infection in this area today with the drain out and he is not systemically unwell 2/2; with regards to the drain sites the superior one on the thigh actually is closed down the one on the upper left lateral calf measures about 8 and half centimeters which is an improvement seems to be less prominent although still with a lot of drainage. The only remaining wound is over the first metatarsal head on the left foot and this looks to be continuing to improve with Hydrofera Blue. 2/9; the area on his plantar left foot continues to contract. Callus around the wound edge. The drain sites specifically have not come down in depth. We put the wound VAC on Monday he changed the canister late last night our intake nurse reported a pocket of fluid perhaps caused by our compression wraps Objective Constitutional Sitting or standing Blood Pressure is within target range for patient.. Pulse regular and within target range for patient.Marland Kitchen Respirations regular, non-labored and within target range.. Temperature is normal and within  the target range for the patient.Marland Kitchen Appears in no distress. Vitals Time Taken: 10:58 AM, Height: 74 in, Source: Stated, Weight: 238 lbs, Source: Stated, BMI: 30.6, Temperature: 98.9 F, Pulse: 66 bpm, Respiratory Rate: 20 breaths/min, Blood Pressure: 140/70 mmHg, Capillary Blood Glucose: 191 mg/dl. General Notes: glucose per pt at present per pt's meter General Notes: Wound exam; left anterior lower leg is closed. The area on his thigh which was the proximal drain site also seems to be closed. The drain site on his left anterior lower leg still probes superiorly  8 cm this has not come down at all. oo I used a #3 curette to remove callus and dry skin and from around the edge of the plantar foot wound. Also debris on the surface minimal bleeding controlled with direct pressure Integumentary (Hair, Skin) Wound #18 status is Open. Original cause of wound was Gradually Appeared. The date acquired was: 08/23/2020. The wound has been in treatment 16 weeks. The wound is located on the Left,Plantar Metatarsal head first. The wound measures 0.9cm length x 1.7cm width x 0.1cm depth; 1.202cm^2 area and 0.12cm^3 volume. There is Fat Layer (Subcutaneous Tissue) exposed. There is no tunneling or undermining noted. There is a medium amount of serosanguineous drainage noted. The wound margin is thickened. There is large (67-100%) red granulation within the wound bed. There is no necrotic tissue within the wound bed. Wound #19 status is Open. Original cause of wound was Gradually Appeared. The date acquired was: 08/23/2020. The wound has been in treatment 16 weeks. The wound is located on the Left,Anterior Lower Leg. The wound measures 0.1cm length x 0.1cm width x 0.1cm depth; 0.008cm^2 area and 0.001cm^3 volume. The wound is limited to skin breakdown. There is no tunneling or undermining noted. There is a none present amount of drainage noted. The wound margin is flat and intact. There is no granulation within the wound  bed. There is no necrotic tissue within the wound bed. Wound #22 status is Open. Original cause of wound was Bump. The date acquired was: 06/03/2021. The wound has been in treatment 8 weeks. The wound is located on the Left,Lateral Lower Leg. The wound measures 1.9cm length x 1.6cm width x 0.8cm depth; 2.388cm^2 area and 1.91cm^3 volume. There is Fat Layer (Subcutaneous Tissue) exposed. There is no undermining noted, however, there is tunneling at 12:00 with a maximum distance of 8.1cm. There is a large amount of serosanguineous drainage noted. The wound margin is distinct with the outline attached to the wound base. There is large (67-100%) red granulation within the wound bed. There is no necrotic tissue within the wound bed. Wound #23 status is Open. Original cause of wound was Laceration. The date acquired was: 06/05/2021. The wound has been in treatment 2 weeks. The wound is located on the Left,Lateral Upper Leg. The wound measures 0.3cm length x 0.2cm width x 0.2cm depth; 0.047cm^2 area and 0.009cm^3 volume. There is Fat Layer (Subcutaneous Tissue) exposed. There is no tunneling or undermining noted. There is a small amount of serosanguineous drainage noted. The wound margin is distinct with the outline attached to the wound base. There is large (67-100%) red granulation within the wound bed. There is no necrotic tissue within the wound bed. 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 There was a Excisional Skin/Subcutaneous Tissue Debridement with a total area of 4 sq cm performed by Ricard Dillon., MD. With the following instrument(s): Curette to remove Viable and Non-Viable tissue/material. Material removed includes Callus,  Subcutaneous Tissue, and Skin: Epidermis. No specimens were taken. A time out was conducted at 11:25, prior to the start of the procedure. A Minimum amount of bleeding was controlled with Pressure. The procedure was tolerated well with a pain level of 0 throughout and a pain level of 0 following the procedure. Post Debridement Measurements: 0.9cm length x 1.7cm width x 0.1cm depth; 0.12cm^3 volume. Character of Wound/Ulcer Post Debridement is improved. Severity of Tissue Post Debridement is: Fat layer exposed. Post procedure Diagnosis Wound #18: Same as Pre-Procedure Wound #22 Pre-procedure diagnosis of Wound #22 is a Cyst located on the Left,Lateral Lower Leg . There was a Double Layer Compression Therapy Procedure by Baruch Gouty, RN. Post procedure Diagnosis Wound #22: Same as Pre-Procedure Plan Follow-up Appointments: Return Appointment in 1 week. Dellia Nims on Thursday room 2 Nurse Visit: - Monday for [facility] change Bathing/ Shower/ Hygiene: May shower with protection but do not get wound dressing(s) wet. - Use a cast protector so you can shower without getting your wrap(s) wet Negative Presssure Wound Therapy: Wound #22 Left,Lateral Lower Leg: Wound Vac to wound continuously at 174m/hg pressure - change 2 times per week in clinic Black Foam Sterile Gauze Packing - in tunnel Edema Control - Lymphedema / SCD / Other: Elevate legs to the level of the heart or above for 30 minutes daily and/or when sitting, a frequency of: - throughout the day Avoid standing for long  periods of time. Patient to wear own compression stockings every day. - on right leg; Moisturize legs daily. - Ammonium LACTATE to BLE every day. Off-Loading: Wedge shoe to: - left front foot offloader WOUND #18: - Metatarsal head first Wound Laterality: Plantar, Left Cleanser: Soap and Water 2 x Per Week/7 Days Discharge Instructions: May shower and wash wound with dial antibacterial soap and water prior to dressing change. Cleanser: Wound Cleanser 2 x Per Week/7 Days Discharge Instructions: Cleanse the wound with wound cleanser prior to applying a clean dressing using gauze sponges, not tissue or cotton balls. Peri-Wound Care: Triamcinolone 15 (g) 2 x Per Week/7 Days Discharge Instructions: Use triamcinolone 15 (g) as directed Peri-Wound Care: Sween Lotion (Moisturizing lotion) 2 x Per Week/7 Days Discharge Instructions: Apply moisturizing lotion as directed Prim Dressing: Hydrofera Blue Ready Foam, 2.5 x2.5 in 2 x Per Week/7 Days ary Discharge Instructions: Apply to wound bed as instructed Secondary Dressing: ABD Pad, 5x9 2 x Per Week/7 Days Discharge Instructions: Apply over primary dressing as directed. Com pression Wrap: CoFlex TLC XL 2-layer Compression System 4x7 (in/yd) 2 x Per Week/7 Days Discharge Instructions: Apply CoFlex 2-layer compression as directed. (alt for 4 layer) WOUND #19: - Lower Leg Wound Laterality: Left, Anterior Cleanser: Soap and Water 2 x Per Week/7 Days Discharge Instructions: May shower and wash wound with dial antibacterial soap and water prior to dressing change. Cleanser: Wound Cleanser 2 x Per Week/7 Days Discharge Instructions: Cleanse the wound with wound cleanser prior to applying a clean dressing using gauze sponges, not tissue or cotton balls. Peri-Wound Care: Triamcinolone 15 (g) 2 x Per Week/7 Days Discharge Instructions: Use triamcinolone 15 (g) as directed Peri-Wound Care: Sween Lotion (Moisturizing lotion) 2 x Per Week/7 Days Discharge  Instructions: Apply moisturizing lotion as directed Prim Dressing: Hydrofera Blue Ready Foam, 2.5 x2.5 in 2 x Per Week/7 Days ary Discharge Instructions: Apply to wound bed as instructed Secondary Dressing: Woven Gauze Sponge, Non-Sterile 4x4 in 2 x Per Week/7 Days Discharge Instructions: Apply over primary dressing as directed. Com pression Wrap:  CoFlex TLC XL 2-layer Compression System 4x7 (in/yd) 2 x Per Week/7 Days Discharge Instructions: Apply CoFlex 2-layer compression as directed. (alt for 4 layer) WOUND #22: - Lower Leg Wound Laterality: Left, Lateral Cleanser: Soap and Water 2 x Per Week/7 Days Discharge Instructions: May shower and wash wound with dial antibacterial soap and water prior to dressing change. Cleanser: Wound Cleanser 2 x Per Week/7 Days Discharge Instructions: Cleanse the wound with wound cleanser prior to applying a clean dressing using gauze sponges, not tissue or cotton balls. Peri-Wound Care: Triamcinolone 15 (g) 2 x Per Week/7 Days Discharge Instructions: Use triamcinolone 15 (g) as directed Peri-Wound Care: Sween Lotion (Moisturizing lotion) 2 x Per Week/7 Days Discharge Instructions: Apply moisturizing lotion as directed Prim Dressing: VAC 2 x Per Week/7 Days ary Com pression Wrap: CoFlex TLC XL 2-layer Compression System 4x7 (in/yd) 2 x Per Week/7 Days Discharge Instructions: Apply CoFlex 2-layer compression as directed. (alt for 4 layer) WOUND #23: - Upper Leg Wound Laterality: Left, Lateral Secondary Dressing: Zetuvit Plus Silicone Border Dressing 7x7(in/in) 2 x Per Week/ Discharge Instructions: Apply silicone border over primary dressing as directed. 1. We continue with Hydrofera Blue to the left foot as the primary dressing 2. We are still putting a 4-layer equivalent compression on the lower leg. 3. Wound VAC reapplied anxious to see some improvement in the tunnel in terms of dimensions Electronic Signature(s) Signed: 08/03/2021 5:29:10 PM By: Linton Ham MD Entered By: Linton Ham on 08/03/2021 11:57:22 -------------------------------------------------------------------------------- SuperBill Details Patient Name: Date of Service: [name] 08/03/2021 Medical Record Number: [id number] Patient Account Number: [id number] Date of Birth/Sex: Treating RN: 08-15-1950 (71 y.o. [name] Primary Care Provider: [name] Other Clinician: Referring Provider: Treating Provider/Extender: Stormy Card in Treatment: 16 Diagnosis Coding ICD-10 Codes Code Description E11.621 Type 2 diabetes mellitus with foot ulcer 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 I89.0 Lymphedema, not elsewhere classified I87.322 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verity 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 Facility Procedures CPT4 Code: [id number] Description: 57017 - DEB SUBQ TISSUE 20 SQ CM/< ICD-10 Diagnosis Description 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 of other part of left foot with other specified sever Modifier: ity Quantity: 1 CPT4 Code: [id number] Description: 92330 - WOUND VAC-50 SQ CM OR LESS Modifier: Quantity: 1 Physician Procedures : CPT4 Code Description Modifier [id number] 11042 - WC PHYS SUBQ TISS 20 SQ CM ICD-10 Diagnosis Description 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 of other part of left foot with other specified severity Quantity: 1 Electronic Signature(s) Signed: 08/03/2021 5:29:10 PM By: Linton Ham MD Entered By: Linton Ham on 08/03/2021 11:57:42

## 2021-08-03 NOTE — Progress Notes (Signed)
JUDGE, DUQUE ([id number]) Visit Report for 08/03/2021 Arrival Information Details Patient Name: Date of Service: [name], [name] 08/03/2021 10:45 A M Medical Record Number: [id number] Patient Account Number: [id number] Date of Birth/Sex: Treating RN: 20-Aug-1950 (71 y.o. [name], [name] Primary Care [name]: [name] Other Clinician: Referring Anely Spiewak: Treating Remy Dia/Extender: Stormy Card in Treatment: 46 Visit Information History Since Last Visit Added or deleted any medications: No Patient Arrived: Ambulatory Any new allergies or adverse reactions: No Arrival Time: 10:55 Had a fall or experienced change in No Accompanied By: self activities of daily living that may affect Transfer Assistance: None risk of falls: Patient Identification Verified: Yes Signs or symptoms of abuse/neglect since last visito No Secondary Verification Process Completed: Yes Hospitalized since last visit: No Patient Requires Transmission-Based Precautions: No Implantable device outside of the clinic excluding No Patient Has Alerts: Yes cellular tissue based products placed in the center Patient Alerts: ABI's: 09/22 L:1.09 R: 1. since last visit: TBI's: R:0.72 L:0.99 Has Dressing in Place as Prescribed: Yes Has Compression in Place as Prescribed: Yes Pain Present Now: No Electronic Signature(s) Signed: 08/03/2021 6:03:12 PM By: Baruch Gouty RN, BSN Entered By: Baruch Gouty on 08/03/2021 10:56:29 -------------------------------------------------------------------------------- Compression Therapy Details Patient Name: Date of Service: [name]. 08/03/2021 10:45 A M Medical Record Number: [id number] Patient Account Number: [id number] Date of Birth/Sex: Treating RN: 1950/07/17 (71 y.o. [name] Primary Care Geofrey Silliman: [name] Other Clinician: Referring Tighe Gitto: Treating Nomar Broad/Extender: Stormy Card in Treatment:  16 Compression Therapy Performed for Wound Assessment: Wound #22 Left,Lateral Lower Leg Performed By: Clinician Baruch Gouty, RN Compression Type: Double Layer Post Procedure Diagnosis Same as Pre-procedure Electronic Signature(s) Signed: 08/03/2021 6:03:12 PM By: Baruch Gouty RN, BSN Entered By: Baruch Gouty on 08/03/2021 11:26:29 -------------------------------------------------------------------------------- Encounter Discharge Information Details Patient Name: Date of Service: [name]. 08/03/2021 10:45 A M Medical Record Number: [id number] Patient Account Number: [id number] Date of Birth/Sex: Treating RN: [birth year] (71 y.o. [name] Primary Care Josselyn Harkins: [name] Other Clinician: Referring Dartanion Teo: Treating Raudel Bazen/Extender: Stormy Card in Treatment: 16 Encounter Discharge Information Items Post Procedure Vitals Discharge Condition: Stable Temperature (F): 98.9 Ambulatory Status: Ambulatory Pulse (bpm): 66 Discharge Destination: Home Respiratory Rate (breaths/min): 18 Transportation: Private Auto Blood Pressure (mmHg): 140/70 Accompanied By: self Schedule Follow-up Appointment: Yes Clinical Summary of Care: Patient Declined Electronic Signature(s) Signed: 08/03/2021 6:03:12 PM By: Baruch Gouty RN, BSN Entered By: Baruch Gouty on 08/03/2021 12:00:12 -------------------------------------------------------------------------------- Lower Extremity Assessment Details Patient Name: Date of Service: [name]. 08/03/2021 10:45 A M Medical Record Number: [id number] Patient Account Number: [id number] Date of Birth/Sex: Treating RN: 1950/09/19 (71 y.o. [name] Primary Care Trysta Showman: [name] Other Clinician: Referring Elaine Middleton: Treating Enis Riecke/Extender: Stormy Card in Treatment: 16 Edema Assessment Assessed: [name]: No] [name]: No] Edema: [Left: Ye] [Right:  s] Calf Left: Right: Point of Measurement: 41 cm From Medial Instep 43 cm Ankle Left: Right: Point of Measurement: 10 cm From Medial Instep 28 cm Vascular Assessment Pulses: Dorsalis Pedis Palpable: [Left:Yes] Electronic Signature(s) Signed: 08/03/2021 6:03:12 PM By: Baruch Gouty RN, BSN Entered By: Baruch Gouty on 08/03/2021 11:21:23 -------------------------------------------------------------------------------- Multi Wound Chart Details Patient Name: Date of Service: [name]. 08/03/2021 10:45 A M Medical Record Number: [id number] Patient Account Number: [id number] Date of Birth/Sex: Treating RN: 1950-08-23 (71 y.o. [name] Primary Care Lilianne Delair: [name] Other Clinician: Referring Yona Kosek: Treating Tawania Daponte/Extender: Azzie Glatter,  [name] in Treatment: 16 Vital Signs Height(in): 74 Capillary Blood Glucose(mg/dl): 191 Weight(lbs): 238 Pulse(bpm): 34 Body Mass Index(BMI): 30.6 Blood Pressure(mmHg): 140/70 Temperature(F): 98.9 Respiratory Rate(breaths/min): 20 Photos: Left, Plantar Metatarsal head first Left, Anterior Lower Leg Left, Lateral Lower Leg Wound Location: Gradually Appeared Gradually Appeared Bump Wounding Event: Diabetic Wound/Ulcer of the Lower Diabetic Wound/Ulcer of the Lower Cyst Primary Etiology: Extremity Extremity Glaucoma, Sleep Apnea, Glaucoma, Sleep Apnea, Glaucoma, Sleep Apnea, Comorbid History: Hypertension, Peripheral Arterial Hypertension, Peripheral Arterial Hypertension, Peripheral Arterial Disease, Peripheral Venous Disease, Disease, Peripheral Venous Disease, Disease, Peripheral Venous Disease, Type II Diabetes, Gout, Osteoarthritis, Type II Diabetes, Gout, Osteoarthritis, Type II Diabetes, Gout, Osteoarthritis, Neuropathy Neuropathy Neuropathy 08/23/2020 08/23/2020 06/03/2021 Date Acquired: _0 Weeks of Treatment: Open Open Open Wound Status: No No No Wound Recurrence: 0.9x1.7x0.1  0.1x0.1x0.1 1.9x1.6x0.8 Measurements L x W x D (cm) 1.202 0.008 2.388 A (cm) : rea 0.12 0.001 1.91 Volume (cm) : 91.50% 99.90% -44.80% % Reduction in A rea: 95.80% 100.00% -44.80% % Reduction in Volume: 12 Position 1 (o'clock): 8.1 Maximum Distance 1 (cm): No No Yes Tunneling: Grade 2 Grade 2 Full Thickness With Exposed Support Classification: Structures Medium None Present Large Exudate A mount: Serosanguineous N/A Serosanguineous Exudate Type: red, brown N/A red, brown Exudate Color: Thickened Flat and Intact Distinct, outline attached Wound Margin: Large (67-100%) None Present (0%) Large (67-100%) Granulation A mount: Red N/A Red Granulation Quality: None Present (0%) None Present (0%) None Present (0%) Necrotic A mount: Fat Layer (Subcutaneous Tissue): Yes Fascia: No Fat Layer (Subcutaneous Tissue): Yes Exposed Structures: Fascia: No Fat Layer (Subcutaneous Tissue): No Fascia: No Tendon: No Tendon: No Tendon: No Muscle: No Muscle: No Muscle: No Joint: No Joint: No Joint: No Bone: No Bone: No Bone: No Limited to Skin Breakdown Small (1-33%) Large (67-100%) N/A Epithelialization: Debridement - Excisional N/A N/A Debridement: Pre-procedure Verification/Time Out 11:25 N/A N/A Taken: Callus, Subcutaneous N/A N/A Tissue Debrided: Skin/Subcutaneous Tissue N/A N/A Level: 4 N/A N/A Debridement A (sq cm): rea Curette N/A N/A Instrument: Minimum N/A N/A Bleeding: Pressure N/A N/A Hemostasis A chieved: 0 N/A N/A Procedural Pain: 0 N/A N/A Post Procedural Pain: Procedure was tolerated well N/A N/A Debridement Treatment Response: 0.9x1.7x0.1 N/A N/A Post Debridement Measurements L x W x D (cm) 0.12 N/A N/A Post Debridement Volume: (cm) Debridement N/A Compression Therapy Procedures Performed: Negative Pressure Wound Therapy Maintenance (NPWT) Wound Number: 23 N/A N/A Photos: N/A N/A Left, Lateral Upper Leg N/A N/A Wound  Location: Laceration N/A N/A Wounding Event: Cyst N/A N/A Primary Etiology: Glaucoma, Sleep Apnea, N/A N/A Comorbid History: Hypertension, Peripheral Arterial Disease, Peripheral Venous Disease, Type II Diabetes, Gout, Osteoarthritis, Neuropathy 06/05/2021 N/A N/A Date A cquired: 2 N/A N/A Weeks of Treatment: Open N/A N/A Wound Status: No N/A N/A Wound Recurrence: 0.3x0.2x0.2 N/A N/A Measurements L x W x D (cm) 0.047 N/A N/A A (cm) : rea 0.009 N/A N/A Volume (cm) : 77.80% N/A N/A % Reduction in A rea: 94.70% N/A N/A % Reduction in Volume: No N/A N/A Tunneling: Full Thickness Without Exposed N/A N/A Classification: Support Structures Small N/A N/A Exudate A mount: Serosanguineous N/A N/A Exudate Type: red, brown N/A N/A Exudate Color: Distinct, outline attached N/A N/A Wound Margin: Large (67-100%) N/A N/A Granulation A mount: Red N/A N/A Granulation Quality: None Present (0%) N/A N/A Necrotic A mount: Fat Layer (Subcutaneous Tissue): Yes N/A N/A Exposed Structures: Fascia: No Tendon: No Muscle: No Joint: No Bone: No Large (67-100%) N/A N/A Epithelialization: N/A N/A  N/A Debridement: N/A N/A N/A Tissue Debrided: N/A N/A N/A Level: N/A N/A N/A Debridement A (sq cm): rea N/A N/A N/A Instrument: N/A N/A N/A Bleeding: N/A N/A N/A Hemostasis A chieved: N/A N/A N/A Procedural Pain: N/A N/A N/A Post Procedural Pain: Debridement Treatment Response: N/A N/A N/A Post Debridement Measurements L x N/A N/A N/A W x D (cm) N/A N/A N/A Post Debridement Volume: (cm) N/A N/A N/A Procedures Performed: Treatment Notes Electronic Signature(s) Signed: 08/03/2021 5:29:10 PM By: Linton Ham MD Signed: 08/03/2021 6:05:40 PM By: Levan Hurst RN, BSN Entered By: Linton Ham on 08/03/2021 11:43:14 -------------------------------------------------------------------------------- Multi-Disciplinary Care Plan Details Patient Name: Date of  Service: [name]. 08/03/2021 10:45 A M Medical Record Number: [id number] Patient Account Number: [id number] Date of Birth/Sex: Treating RN: 1950/07/21 (71 y.o. [name] Primary Care Briawna Carver: [name] Other Clinician: Referring Olman Yono: Treating Lynna Zamorano/Extender: Stormy Card in Treatment: 16 Multidisciplinary Care Plan reviewed with physician Active Inactive Soft Tissue Infection Nursing Diagnoses: Impaired tissue integrity Potential for infection: soft tissue Goals: Patient's soft tissue infection will resolve Date Initiated: 04/20/2021 Target Resolution Date: 08/24/2021 Goal Status: Active Interventions: Assess signs and symptoms of infection every visit Provide education on infection Treatment Activities: Culture : 04/20/2021 Education provided on Infection : 07/27/2021 Systemic antibiotics : 04/20/2021 T ordered outside of clinic : 04/20/2021 est Notes: Venous Leg Ulcer Nursing Diagnoses: Knowledge deficit related to disease process and management Potential for venous Insuffiency (use before diagnosis confirmed) Goals: Patient will maintain optimal edema control Date Initiated: 07/27/2021 Target Resolution Date: 08/24/2021 Goal Status: Active Interventions: Assess peripheral edema status every visit. Treatment Activities: Therapeutic compression applied : 07/27/2021 Notes: Wound/Skin Impairment Nursing Diagnoses: Impaired tissue integrity Knowledge deficit related to ulceration/compromised skin integrity Goals: Patient will have a decrease in wound volume by X% from date: (specify in notes) Date Initiated: 04/12/2021 Date Inactivated: 04/27/2021 Target Resolution Date: 04/23/2021 Goal Status: Met Patient/caregiver will verbalize understanding of skin care regimen Date Initiated: 04/12/2021 Target Resolution Date: 08/24/2021 Goal Status: Active Ulcer/skin breakdown will have a volume reduction of 30% by week 4 Date  Initiated: 04/12/2021 Date Inactivated: 04/27/2021 Target Resolution Date: 04/27/2021 Goal Status: Unmet Unmet Reason: infection Ulcer/skin breakdown will have a volume reduction of 50% by week 8 Date Initiated: 04/27/2021 Date Inactivated: 06/29/2021 Target Resolution Date: 06/24/2021 Goal Status: Met Interventions: Assess patient/caregiver ability to obtain necessary supplies Assess patient/caregiver ability to perform ulcer/skin care regimen upon admission and as needed Assess ulceration(s) every visit Notes: Electronic Signature(s) Signed: 08/03/2021 6:03:12 PM By: Baruch Gouty RN, BSN Entered By: Baruch Gouty on 08/03/2021 11:22:30 -------------------------------------------------------------------------------- Negative Pressure Wound Therapy Maintenance (NPWT) Details Patient Name: Date of Service: [name], [name] 08/03/2021 10:45 A M Medical Record Number: [id number] Patient Account Number: [id number] Date of Birth/Sex: Treating RN: 11/15/1950 (71 y.o. [name] Primary Care Kess Mcilwain: [name] Other Clinician: Referring Shiara Mcgough: Treating Norwin Aleman/Extender: Stormy Card in Treatment: 16 NPWT Maintenance Performed for: Wound #22 Left, Lateral Lower Leg Additional Injuries Covered: No Performed By: Baruch Gouty, RN Type: VAC System Coverage Size (sq cm): 3.04 Pressure Type: Constant Pressure Setting: 125 mmHG Drain Type: None Sponge/Dressing Type: Combination : gauze and sponge Date Initiated: 07/27/2021 Dressing Removed: No Quantity of Sponges/Gauze Removed: 2 Canister Changed: No Canister Exudate Volume: 200 Dressing Reapplied: No Quantity of Sponges/Gauze Inserted: 2 Respones T Treatment: o good Days On NPWT : 8 Post Procedure Diagnosis Same as Pre-procedure Electronic Signature(s) Signed: 08/03/2021 6:03:12 PM By: Baruch Gouty  RN, BSN Entered By: Baruch Gouty on 08/03/2021  11:32:58 -------------------------------------------------------------------------------- Pain Assessment Details Patient Name: Date of Service: [name], [name] 08/03/2021 10:45 A M Medical Record Number: [id number] Patient Account Number: [id number] Date of Birth/Sex: Treating RN: [birth year] (71 y.o. [name] Primary Care Xian Apostol: [name] Other Clinician: Referring Nzinga Ferran: Treating Tequan Redmon/Extender: Stormy Card in Treatment: 16 Active Problems Location of Pain Severity and Description of Pain Patient Has Paino No Site Locations Rate the pain. Current Pain Level: 0 Pain Management and Medication Current Pain Management: Electronic Signature(s) Signed: 08/03/2021 6:03:12 PM By: Baruch Gouty RN, BSN Entered By: Baruch Gouty on 08/03/2021 10:56:48 -------------------------------------------------------------------------------- Patient/Caregiver Education Details Patient Name: Date of Service: [name] 2/9/2023andnbsp10:45 A M Medical Record Number: [id number] Patient Account Number: [id number] Date of Birth/Gender: Treating RN: April 18, 1951 (71 y.o. [name] Primary Care Physician: [name] Other Clinician: Referring Physician: Treating Physician/Extender: Stormy Card in Treatment: 16 Education Assessment Education Provided To: Patient Education Topics Provided Infection: Methods: Explain/Verbal Responses: Reinforcements needed, State content correctly Offloading: Methods: Explain/Verbal Responses: Reinforcements needed, State content correctly Venous: Methods: Explain/Verbal Responses: Reinforcements needed, State content correctly Electronic Signature(s) Signed: 08/03/2021 6:03:12 PM By: Baruch Gouty RN, BSN Entered By: Baruch Gouty on 08/03/2021 11:23:00 -------------------------------------------------------------------------------- Wound Assessment Details Patient  Name: Date of Service: [name]. 08/03/2021 10:45 A M Medical Record Number: [id number] Patient Account Number: [id number] Date of Birth/Sex: Treating RN: [birth year] (71 y.o. [name] Primary Care Lilah Mijangos: [name] Other Clinician: Referring Kalle Bernath: Treating Ildefonso Keaney/Extender: Stormy Card in Treatment: 16 Wound Status Wound Number: 18 Primary Diabetic Wound/Ulcer of the Lower Extremity Etiology: Wound Location: Left, Plantar Metatarsal head first Wound Open Wounding Event: Gradually Appeared Status: Date Acquired: 08/23/2020 Comorbid Glaucoma, Sleep Apnea, Hypertension, Peripheral Arterial Disease, Weeks Of Treatment: 16 History: Peripheral Venous Disease, Type II Diabetes, Gout, Osteoarthritis, Clustered Wound: No Neuropathy Photos Wound Measurements Length: (cm) 0.9 Width: (cm) 1.7 Depth: (cm) 0.1 Area: (cm) 1.202 Volume: (cm) 0.12 % Reduction in Area: 91.5% % Reduction in Volume: 95.8% Epithelialization: Small (1-33%) Tunneling: No Undermining: No Wound Description Classification: Grade 2 Wound Margin: Thickened Exudate Amount: Medium Exudate Type: Serosanguineous Exudate Color: red, brown Wound Bed Granulation Amount: Large (67-100%) Exposed Structure Granulation Quality: Red Fascia Exposed: No Necrotic Amount: None Present (0%) Fat Layer (Subcutaneous Tissue) Exposed: Yes Tendon Exposed: No Muscle Exposed: No Joint Exposed: No Bone Exposed: No Treatment Notes Wound #18 (Metatarsal head first) Wound Laterality: Plantar, Left Cleanser Soap and Water Discharge Instruction: May shower and wash wound with dial antibacterial soap and water prior to dressing change. Wound Cleanser Discharge Instruction: Cleanse the wound with wound cleanser prior to applying a clean dressing using gauze sponges, not tissue or cotton balls. Peri-Wound Care Triamcinolone 15 (g) Discharge Instruction: Use triamcinolone 15 (g) as  directed Sween Lotion (Moisturizing lotion) Discharge Instruction: Apply moisturizing lotion as directed Topical Primary Dressing Hydrofera Blue Ready Foam, 2.5 x2.5 in Discharge Instruction: Apply to wound bed as instructed Secondary Dressing ABD Pad, 5x9 Discharge Instruction: Apply over primary dressing as directed. Secured With Compression Wrap CoFlex TLC XL 2-layer Compression System 4x7 (in/yd) Discharge Instruction: Apply CoFlex 2-layer compression as directed. (alt for 4 layer) Compression Stockings Add-Ons Electronic Signature(s) Signed: 08/03/2021 6:03:12 PM By: Baruch Gouty RN, BSN Signed: 08/03/2021 6:05:40 PM By: Levan Hurst RN, BSN Entered By: Baruch Gouty on 08/03/2021 11:18:08 -------------------------------------------------------------------------------- Wound Assessment Details Patient Name: Date of Service: [name]  W. 08/03/2021 10:45 A M Medical Record Number: [id number] Patient Account Number: [id number] Date of Birth/Sex: Treating RN: March 20, 1951 (71 y.o. [name] Primary Care Cuma Polyakov: [name] Other Clinician: Referring Susi Goslin: Treating Mayson Sterbenz/Extender: Stormy Card in Treatment: 16 Wound Status Wound Number: 19 Primary Diabetic Wound/Ulcer of the Lower Extremity Etiology: Wound Location: Left, Anterior Lower Leg Wound Open Wounding Event: Gradually Appeared Status: Date Acquired: 08/23/2020 Comorbid Glaucoma, Sleep Apnea, Hypertension, Peripheral Arterial Disease, Weeks Of Treatment: 16 History: Peripheral Venous Disease, Type II Diabetes, Gout, Osteoarthritis, Clustered Wound: No Neuropathy Photos Wound Measurements Length: (cm) 0.1 Width: (cm) 0.1 Depth: (cm) 0.1 Area: (cm) 0.008 Volume: (cm) 0.001 Wound Description Classification: Grade 2 Wound Margin: Flat and Intact Exudate Amount: None Present Foul Odor After Cleansing: Slough/Fibrino % Reduction in Area: 99.9% % Reduction in  Volume: 100% Epithelialization: Large (67-100%) Tunneling: No Undermining: No No No Wound Bed Granulation Amount: None Present (0%) Exposed Structure Necrotic Amount: None Present (0%) Fascia Exposed: No Fat Layer (Subcutaneous Tissue) Exposed: No Tendon Exposed: No Muscle Exposed: No Joint Exposed: No Bone Exposed: No Limited to Skin Breakdown Treatment Notes Wound #19 (Lower Leg) Wound Laterality: Left, Anterior Cleanser Soap and Water Discharge Instruction: May shower and wash wound with dial antibacterial soap and water prior to dressing change. Wound Cleanser Discharge Instruction: Cleanse the wound with wound cleanser prior to applying a clean dressing using gauze sponges, not tissue or cotton balls. Peri-Wound Care Triamcinolone 15 (g) Discharge Instruction: Use triamcinolone 15 (g) as directed Sween Lotion (Moisturizing lotion) Discharge Instruction: Apply moisturizing lotion as directed Topical Primary Dressing Hydrofera Blue Ready Foam, 2.5 x2.5 in Discharge Instruction: Apply to wound bed as instructed Secondary Dressing Woven Gauze Sponge, Non-Sterile 4x4 in Discharge Instruction: Apply over primary dressing as directed. Secured With Compression Wrap CoFlex TLC XL 2-layer Compression System 4x7 (in/yd) Discharge Instruction: Apply CoFlex 2-layer compression as directed. (alt for 4 layer) Compression Stockings Add-Ons Electronic Signature(s) Signed: 08/03/2021 6:03:12 PM By: Baruch Gouty RN, BSN Signed: 08/03/2021 6:05:40 PM By: Levan Hurst RN, BSN Entered By: Baruch Gouty on 08/03/2021 11:18:59 -------------------------------------------------------------------------------- Wound Assessment Details Patient Name: Date of Service: [name]. 08/03/2021 10:45 A M Medical Record Number: [id number] Patient Account Number: [id number] Date of Birth/Sex: Treating RN: 02/14/1951 (71 y.o. [name] Primary Care Brittiney Dicostanzo: [name] Other  Clinician: Referring Nocole Zammit: Treating Trelyn Vanderlinde/Extender: Stormy Card in Treatment: 16 Wound Status Wound Number: 22 Primary Cyst Etiology: Wound Location: Left, Lateral Lower Leg Wound Open Wounding Event: Bump Status: Date Acquired: 06/03/2021 Comorbid Glaucoma, Sleep Apnea, Hypertension, Peripheral Arterial Disease, Weeks Of Treatment: 8 History: Peripheral Venous Disease, Type II Diabetes, Gout, Osteoarthritis, Clustered Wound: No Neuropathy Photos Wound Measurements Length: (cm) 1.9 Width: (cm) 1.6 Depth: (cm) 0.8 Area: (cm) 2.388 Volume: (cm) 1.91 % Reduction in Area: -44.8% % Reduction in Volume: -44.8% Tunneling: Yes Position (o'clock): 12 Maximum Distance: (cm) 8.1 Undermining: No Wound Description Classification: Full Thickness With Exposed Support Structures Wound Margin: Distinct, outline attached Exudate Amount: Large Exudate Type: Serosanguineous Exudate Color: red, brown Foul Odor After Cleansing: No Slough/Fibrino No Wound Bed Granulation Amount: Large (67-100%) Exposed Structure Granulation Quality: Red Fascia Exposed: No Necrotic Amount: None Present (0%) Fat Layer (Subcutaneous Tissue) Exposed: Yes Tendon Exposed: No Muscle Exposed: No Joint Exposed: No Bone Exposed: No Treatment Notes Wound #22 (Lower Leg) Wound Laterality: Left, Lateral Cleanser Soap and Water Discharge Instruction: May shower and wash wound with dial antibacterial soap and water prior to  dressing change. Wound Cleanser Discharge Instruction: Cleanse the wound with wound cleanser prior to applying a clean dressing using gauze sponges, not tissue or cotton balls. Peri-Wound Care Triamcinolone 15 (g) Discharge Instruction: Use triamcinolone 15 (g) as directed Sween Lotion (Moisturizing lotion) Discharge Instruction: Apply moisturizing lotion as directed Topical Primary Dressing VAC Secondary Dressing Secured With Compression Wrap CoFlex TLC  XL 2-layer Compression System 4x7 (in/yd) Discharge Instruction: Apply CoFlex 2-layer compression as directed. (alt for 4 layer) Compression Stockings Add-Ons Electronic Signature(s) Signed: 08/03/2021 6:03:12 PM By: Baruch Gouty RN, BSN Signed: 08/03/2021 6:05:40 PM By: Levan Hurst RN, BSN Entered By: Baruch Gouty on 08/03/2021 11:17:21 -------------------------------------------------------------------------------- Wound Assessment Details Patient Name: Date of Service: [name]. 08/03/2021 10:45 A M Medical Record Number: [id number] Patient Account Number: [id number] Date of Birth/Sex: Treating RN: Jul 07, 1950 (71 y.o. [name] Primary Care Toussaint Golson: [name] Other Clinician: Referring Cayton Cuevas: Treating Dexton Zwilling/Extender: Stormy Card in Treatment: 16 Wound Status Wound Number: 23 Primary Cyst Etiology: Wound Location: Left, Lateral Upper Leg Wound Open Wounding Event: Laceration Status: Date Acquired: 06/05/2021 Comorbid Glaucoma, Sleep Apnea, Hypertension, Peripheral Arterial Disease, Weeks Of Treatment: 2 History: Peripheral Venous Disease, Type II Diabetes, Gout, Osteoarthritis, Clustered Wound: No Neuropathy Photos Wound Measurements Length: (cm) 0.3 Width: (cm) 0.2 Depth: (cm) 0.2 Area: (cm) 0.047 Volume: (cm) 0.009 % Reduction in Area: 77.8% % Reduction in Volume: 94.7% Epithelialization: Large (67-100%) Tunneling: No Undermining: No Wound Description Classification: Full Thickness Without Exposed Support Str Wound Margin: Distinct, outline attached Exudate Amount: Small Exudate Type: Serosanguineous Exudate Color: red, brown uctures Wound Bed Granulation Amount: Large (67-100%) Exposed Structure Granulation Quality: Red Fascia Exposed: No Necrotic Amount: None Present (0%) Fat Layer (Subcutaneous Tissue) Exposed: Yes Tendon Exposed: No Muscle Exposed: No Joint Exposed: No Bone Exposed: No Treatment  Notes Wound #23 (Upper Leg) Wound Laterality: Left, Lateral Cleanser Peri-Wound Care Topical Primary Dressing Secondary Dressing Zetuvit Plus Silicone Border Dressing 7x7(in/in) Discharge Instruction: Apply silicone border over primary dressing as directed. Secured With Compression Wrap Compression Stockings Environmental education officer) Signed: 08/03/2021 6:03:12 PM By: Baruch Gouty RN, BSN Signed: 08/03/2021 6:05:40 PM By: Levan Hurst RN, BSN Entered By: Baruch Gouty on 08/03/2021 11:19:49 -------------------------------------------------------------------------------- Vitals Details Patient Name: Date of Service: [name]. 08/03/2021 10:45 A M Medical Record Number: [id number] Patient Account Number: [id number] Date of Birth/Sex: Treating RN: 1950/08/04 (72 y.o. [name] Primary Care Tychelle Purkey: [name] Other Clinician: Referring Yaiden Yang: Treating Tasneem Cormier/Extender: Stormy Card in Treatment: 16 Vital Signs Time Taken: 10:58 Temperature (F): 98.9 Height (in): 74 Pulse (bpm): 66 Source: Stated Respiratory Rate (breaths/min): 20 Weight (lbs): 238 Blood Pressure (mmHg): 140/70 Source: Stated Capillary Blood Glucose (mg/dl): 191 Body Mass Index (BMI): 30.6 Reference Range: 80 - 120 mg / dl Notes glucose per pt at present per pt's meter Electronic Signature(s) Signed: 08/03/2021 6:03:12 PM By: Baruch Gouty RN, BSN Entered By: Baruch Gouty on 08/03/2021 10:59:39

## 2021-08-07 ENCOUNTER — Other Ambulatory Visit: Payer: Self-pay

## 2021-08-07 ENCOUNTER — Encounter (HOSPITAL_BASED_OUTPATIENT_CLINIC_OR_DEPARTMENT_OTHER): Payer: Medicare Other | Admitting: Internal Medicine

## 2021-08-07 DIAGNOSIS — E11621 Type 2 diabetes mellitus with foot ulcer: Secondary | ICD-10-CM | POA: Diagnosis not present

## 2021-08-07 NOTE — Progress Notes (Signed)
PLATO, ALSPAUGH ([id number]) Visit Report for 08/07/2021 SuperBill Details Patient Name: Date of Service: [name], [name] 08/07/2021 Medical Record Number: [id number] Patient Account Number: [id number] Date of Birth/Sex: Treating RN: [birth year] (71 y.o. 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 Facility Procedures CPT4 Code Description Modifier Quantity [id number] - WOUND VAC-50 SQ CM OR LESS 1 ICD-10 Diagnosis Description 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verity [id number] (Facility Use Only) 29581LT - APPLY MULTLAY COMPRS LWR LT LEG 59 1 ICD-10 Diagnosis Description 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

## 2021-08-07 NOTE — Progress Notes (Signed)
[name], [name] ([id number]) Visit Report for 08/07/2021 Arrival Information Details Patient Name: Date of Service: [name], [name] 08/07/2021 9:00 A M Medical Record Number: [id number] Patient Account Number: [id number] Date of Birth/Sex: Treating RN: 08/25/1950 (71 y.o. [name] Primary Care Latania Bascomb: [name] Other Clinician: Referring Hillary Schwegler: Treating Ovidio Steele/Extender: Cornelious Bryant in Treatment: 39 Visit Information History Since Last Visit Added or deleted any medications: No Patient Arrived: Ambulatory Any new allergies or adverse reactions: No Arrival Time: 09:08 Had a fall or experienced change in No Transfer Assistance: None activities of daily living that may affect Patient Identification Verified: Yes risk of falls: Secondary Verification Process Completed: Yes Signs or symptoms of abuse/neglect since No Patient Requires Transmission-Based Precautions: No last visito Patient Has Alerts: Yes Hospitalized since last visit: No Patient Alerts: ABI's: 09/22 L:1.09 R: 1. Implantable device outside of the clinic No TBI's: R:0.72 L:0.99 excluding cellular tissue based products placed in the center since last visit: Has Dressing in Place as Prescribed: Yes Has Footwear/Offloading in Place as Yes Prescribed: Left: Surgical Shoe with Pressure Relief Insole Pain Present Now: No Electronic Signature(s) Signed: 08/07/2021 5:31:57 PM By: Lorrin Jackson Entered By: Lorrin Jackson on 08/07/2021 09:08:32 -------------------------------------------------------------------------------- Compression Therapy Details Patient Name: Date of Service: [name]. 08/07/2021 9:00 A M Medical Record Number: [id number] Patient Account Number: [id number] Date of Birth/Sex: Treating RN: 07-04-1950 (71 y.o. [name] Primary Care Shawnelle Spoerl: [name] Other Clinician: Referring Inez Rosato: Treating Jerrica Thorman/Extender: Cornelious Bryant in Treatment: 16 Compression Therapy Performed for Wound Assessment: Wound #18 Left,Plantar Metatarsal head first Performed By: Clinician Lorrin Jackson, RN Compression Type: Double Layer Electronic Signature(s) Signed: 08/07/2021 5:31:57 PM By: Lorrin Jackson Entered By: Lorrin Jackson on 08/07/2021 09:15:49 -------------------------------------------------------------------------------- Compression Therapy Details Patient Name: Date of Service: [name]. 08/07/2021 9:00 A M Medical Record Number: [id number] Patient Account Number: [id number] Date of Birth/Sex: Treating RN: 18-Apr-1951 (71 y.o. [name] Primary Care Julyanna Scholle: [name] Other Clinician: Referring Kohle Winner: Treating Imari Reen/Extender: Cornelious Bryant in Treatment: 16 Compression Therapy Performed for Wound Assessment: Wound #19 Left,Anterior Lower Leg Performed By: Clinician Lorrin Jackson, RN Compression Type: Double Layer Electronic Signature(s) Signed: 08/07/2021 5:31:57 PM By: Lorrin Jackson Entered By: Lorrin Jackson on 08/07/2021 09:15:49 -------------------------------------------------------------------------------- Compression Therapy Details Patient Name: Date of Service: [name]. 08/07/2021 9:00 A M Medical Record Number: [id number] Patient Account Number: [id number] Date of Birth/Sex: Treating RN: 08-16-1950 (71 y.o. [name] Primary Care [name]: [name] Other Clinician: Referring Aleks Nawrot: Treating Herndon Grill/Extender: Cornelious Bryant in Treatment: 16 Compression Therapy Performed for Wound Assessment: Wound #22 Left,Lateral Lower Leg Performed By: Clinician Lorrin Jackson, RN Compression Type: Double Layer Electronic Signature(s) Signed: 08/07/2021 5:31:57 PM By: Lorrin Jackson Entered By: Lorrin Jackson on 08/07/2021  09:15:49 -------------------------------------------------------------------------------- Compression Therapy Details Patient Name: Date of Service: [name]. 08/07/2021 9:00 A M Medical Record Number: [id number] Patient Account Number: [id number] Date of Birth/Sex: Treating RN: [birth year] (71 y.o. [name] Primary Care [name]: [name] Other Clinician: Referring [name]: Treating [name]/Extender: Cornelious Bryant in Treatment: 16 Compression Therapy Performed for Wound Assessment: Wound #23 Left,Lateral Upper Leg Performed By: Clinician Lorrin Jackson, RN Compression Type: Double Layer Electronic Signature(s) Signed: 08/07/2021 5:31:57 PM By: Lorrin Jackson Entered By: Lorrin Jackson on 08/07/2021 09:15:49 -------------------------------------------------------------------------------- Encounter Discharge Information Details Patient Name: Date of Service: [name]. 08/07/2021 9:00 A M Medical  Record Number: [id number] Patient Account Number: [id number] Date of Birth/Sex: Treating RN: [birth year] (71 y.o. [name] Primary Care [name]: [name] Other Clinician: Referring [name]: Treating [name]/Extender: Cornelious Bryant in Treatment: 16 Encounter Discharge Information Items Discharge Condition: Stable Ambulatory Status: Ambulatory Discharge Destination: Home Transportation: Private Auto Schedule Follow-up Appointment: Yes Clinical Summary of Care: Patient Declined Electronic Signature(s) Signed: 08/07/2021 9:43:30 AM By: Lorrin Jackson Entered By: Lorrin Jackson on 08/07/2021 09:43:30 -------------------------------------------------------------------------------- Negative Pressure Wound Therapy Maintenance (NPWT) Details Patient Name: Date of Service: [name], [name] 08/07/2021 9:00 A M Medical Record Number: [id number] Patient Account Number: [id number] Date of Birth/Sex: Treating  RN: 1951-01-01 (71 y.o. [name] Primary Care Greggory Safranek: [name] Other Clinician: Referring Abbie Berling: Treating Issacc Merlo/Extender: Cornelious Bryant in Treatment: 16 NPWT Maintenance Performed for: Wound #22 Left, Lateral Lower Leg Additional Injuries Covered: No Performed By: Lorrin Jackson, RN Type: VAC System Coverage Size (sq cm): 3.04 Pressure Type: Constant Pressure Setting: 125 mmHG Drain Type: None Sponge/Dressing Type: Combination : Black, Gauze Date Initiated: 07/27/2021 Dressing Removed: Yes Canister Changed: No Dressing Reapplied: Yes Days On NPWT : 12 Electronic Signature(s) Signed: 08/07/2021 5:31:57 PM By: Lorrin Jackson Entered By: Lorrin Jackson on 08/07/2021 09:13:27 -------------------------------------------------------------------------------- Patient/Caregiver Education Details Patient Name: Date of Service: [name] 2/13/2023andnbsp9:00 [place] Record Number: [id number] Patient Account Number: [id number] Date of Birth/Gender: Treating RN: 1951-03-15 (71 y.o. [name] Primary Care Physician: [name] Other Clinician: Referring Physician: Treating Physician/Extender: Cornelious Bryant in Treatment: 16 Education Assessment Education Provided To: Patient Education Topics Provided Infection: Methods: Explain/Verbal Responses: State content correctly Wound/Skin Impairment: Methods: Explain/Verbal Responses: State content correctly Electronic Signature(s) Signed: 08/07/2021 5:31:57 PM By: Lorrin Jackson Entered By: Lorrin Jackson on 08/07/2021 09:43:15 -------------------------------------------------------------------------------- Wound Assessment Details Patient Name: Date of Service: [name]. 08/07/2021 9:00 A M Medical Record Number: [id number] Patient Account Number: [id number] Date of Birth/Sex: Treating RN: 11/19/1950 (71 y.o. [name] Primary Care  Makayah Pauli: [name] Other Clinician: Referring Cyann Venti: Treating Saffron Busey/Extender: Cornelious Bryant in Treatment: 16 Wound Status Wound Number: 18 Primary Diabetic Wound/Ulcer of the Lower Extremity Etiology: Wound Location: Left, Plantar Metatarsal head first Wound Open Wounding Event: Gradually Appeared Status: Date Acquired: 08/23/2020 Comorbid Glaucoma, Sleep Apnea, Hypertension, Peripheral Arterial Disease, Weeks Of Treatment: 16 History: Peripheral Venous Disease, Type II Diabetes, Gout, Osteoarthritis, Clustered Wound: No Neuropathy Wound Measurements Length: (cm) 0.9 Width: (cm) 1.7 Depth: (cm) 0.1 Area: (cm) 1.202 Volume: (cm) 0.12 % Reduction in Area: 91.5% % Reduction in Volume: 95.8% Epithelialization: Small (1-33%) Tunneling: No Undermining: No Wound Description Classification: Grade 2 Wound Margin: Thickened Exudate Amount: Medium Exudate Type: Serosanguineous Exudate Color: red, brown Foul Odor After Cleansing: No Slough/Fibrino No Wound Bed Granulation Amount: Large (67-100%) Exposed Structure Granulation Quality: Red Fascia Exposed: No Necrotic Amount: None Present (0%) Fat Layer (Subcutaneous Tissue) Exposed: Yes Tendon Exposed: No Muscle Exposed: No Joint Exposed: No Bone Exposed: No Treatment Notes Wound #18 (Metatarsal head first) Wound Laterality: Plantar, Left Cleanser Soap and Water Discharge Instruction: May shower and wash wound with dial antibacterial soap and water prior to dressing change. Wound Cleanser Discharge Instruction: Cleanse the wound with wound cleanser prior to applying a clean dressing using gauze sponges, not tissue or cotton balls. Peri-Wound Care Triamcinolone 15 (g) Discharge Instruction: Use triamcinolone 15 (g) as directed Sween Lotion (Moisturizing lotion) Discharge Instruction: Apply moisturizing lotion as directed Topical Primary Dressing Hydrofera  Blue Ready Foam, 2.5 x2.5  in Discharge Instruction: Apply to wound bed as instructed Secondary Dressing ABD Pad, 5x9 Discharge Instruction: Apply over primary dressing as directed. Secured With Compression Wrap CoFlex TLC XL 2-layer Compression System 4x7 (in/yd) Discharge Instruction: Apply CoFlex 2-layer compression as directed. (alt for 4 layer) Compression Stockings Add-Ons Electronic Signature(s) Signed: 08/07/2021 5:31:57 PM By: Lorrin Jackson Entered By: Lorrin Jackson on 08/07/2021 09:11:43 -------------------------------------------------------------------------------- Wound Assessment Details Patient Name: Date of Service: [name]. 08/07/2021 9:00 A M Medical Record Number: [id number] Patient Account Number: [id number] Date of Birth/Sex: Treating RN: [birth year] (71 y.o. [name] Primary Care Hisao Doo: [name] Other Clinician: Referring Elex Mainwaring: Treating Kieran Arreguin/Extender: Cornelious Bryant in Treatment: 16 Wound Status Wound Number: 19 Primary Diabetic Wound/Ulcer of the Lower Extremity Etiology: Wound Location: Left, Anterior Lower Leg Wound Open Wounding Event: Gradually Appeared Status: Date Acquired: 08/23/2020 Comorbid Glaucoma, Sleep Apnea, Hypertension, Peripheral Arterial Disease, Weeks Of Treatment: 16 History: Peripheral Venous Disease, Type II Diabetes, Gout, Osteoarthritis, Clustered Wound: No Neuropathy Wound Measurements Length: (cm) 0.1 Width: (cm) 0.1 Depth: (cm) 0.1 Area: (cm) 0.008 Volume: (cm) 0.001 Wound Description Classification: Grade 2 Wound Margin: Distinct, outline attached Exudate Amount: None Present Foul Odor After Cleansing: Slough/Fibrino % Reduction in Area: 99.9% % Reduction in Volume: 100% Epithelialization: Large (67-100%) Tunneling: No Undermining: No No No Wound Bed Granulation Amount: Large (67-100%) Exposed Structure Granulation Quality: Red, Pink Fascia Exposed: No Necrotic Amount: None  Present (0%) Fat Layer (Subcutaneous Tissue) Exposed: Yes Tendon Exposed: No Muscle Exposed: No Joint Exposed: No Bone Exposed: No Treatment Notes Wound #19 (Lower Leg) Wound Laterality: Left, Anterior Cleanser Soap and Water Discharge Instruction: May shower and wash wound with dial antibacterial soap and water prior to dressing change. Wound Cleanser Discharge Instruction: Cleanse the wound with wound cleanser prior to applying a clean dressing using gauze sponges, not tissue or cotton balls. Peri-Wound Care Triamcinolone 15 (g) Discharge Instruction: Use triamcinolone 15 (g) as directed Sween Lotion (Moisturizing lotion) Discharge Instruction: Apply moisturizing lotion as directed Topical Primary Dressing Hydrofera Blue Ready Foam, 2.5 x2.5 in Discharge Instruction: Apply to wound bed as instructed Secondary Dressing Woven Gauze Sponge, Non-Sterile 4x4 in Discharge Instruction: Apply over primary dressing as directed. Secured With Compression Wrap CoFlex TLC XL 2-layer Compression System 4x7 (in/yd) Discharge Instruction: Apply CoFlex 2-layer compression as directed. (alt for 4 layer) Compression Stockings Add-Ons Electronic Signature(s) Signed: 08/07/2021 5:31:57 PM By: Lorrin Jackson Entered By: Lorrin Jackson on 08/07/2021 09:12:22 -------------------------------------------------------------------------------- Wound Assessment Details Patient Name: Date of Service: [name]. 08/07/2021 9:00 A M Medical Record Number: [id number] Patient Account Number: [id number] Date of Birth/Sex: Treating RN: 12-02-1950 (71 y.o. [name] Primary Care Iver Fehrenbach: [name] Other Clinician: Referring Anisha Starliper: Treating Keddrick Wyne/Extender: Cornelious Bryant in Treatment: 16 Wound Status Wound Number: 22 Primary Cyst Etiology: Etiology: Wound Location: Left, Lateral Lower Leg Wound Open Wounding Event: Bump Status: Date Acquired:  06/03/2021 Comorbid Glaucoma, Sleep Apnea, Hypertension, Peripheral Arterial Disease, Weeks Of Treatment: 9 History: Peripheral Venous Disease, Type II Diabetes, Gout, Osteoarthritis, Clustered Wound: No Neuropathy Wound Measurements Length: (cm) 1.9 Width: (cm) 1.6 Depth: (cm) 0.8 Area: (cm) 2.388 Volume: (cm) 1.91 % Reduction in Area: -44.8% % Reduction in Volume: -44.8% Epithelialization: None Tunneling: Yes Position (o'clock): 12 Maximum Distance: (cm) 8 Undermining: No Wound Description Classification: Full Thickness With Exposed Support Structures Wound Margin: Distinct, outline attached Exudate Amount: Large Exudate Type: Serosanguineous Exudate Color: red, brown Foul  Odor After Cleansing: No Slough/Fibrino No Wound Bed Granulation Amount: Large (67-100%) Exposed Structure Granulation Quality: Red Fascia Exposed: No Necrotic Amount: None Present (0%) Fat Layer (Subcutaneous Tissue) Exposed: Yes Tendon Exposed: No Muscle Exposed: No Joint Exposed: No Bone Exposed: No Treatment Notes Wound #22 (Lower Leg) Wound Laterality: Left, Lateral Cleanser Soap and Water Discharge Instruction: May shower and wash wound with dial antibacterial soap and water prior to dressing change. Wound Cleanser Discharge Instruction: Cleanse the wound with wound cleanser prior to applying a clean dressing using gauze sponges, not tissue or cotton balls. Peri-Wound Care Triamcinolone 15 (g) Discharge Instruction: Use triamcinolone 15 (g) as directed Sween Lotion (Moisturizing lotion) Discharge Instruction: Apply moisturizing lotion as directed Topical Primary Dressing VAC Secondary Dressing Secured With Compression Wrap CoFlex TLC XL 2-layer Compression System 4x7 (in/yd) Discharge Instruction: Apply CoFlex 2-layer compression as directed. (alt for 4 layer) Compression Stockings Add-Ons Electronic Signature(s) Signed: 08/07/2021 5:31:57 PM By: Lorrin Jackson Entered By:  Lorrin Jackson on 08/07/2021 09:18:43 -------------------------------------------------------------------------------- Wound Assessment Details Patient Name: Date of Service: [name]. 08/07/2021 9:00 A M Medical Record Number: [id number] Patient Account Number: [id number] Date of Birth/Sex: Treating RN: January 15, 1951 (71 y.o. [name] Primary Care Golden Emile: [name] Other Clinician: Referring Karrine Kluttz: Treating Kodie Pick/Extender: Cornelious Bryant in Treatment: 16 Wound Status Wound Number: 23 Primary Cyst Etiology: Wound Location: Left, Lateral Upper Leg Wound Open Wounding Event: Laceration Status: Date Acquired: 06/05/2021 Comorbid Glaucoma, Sleep Apnea, Hypertension, Peripheral Arterial Disease, Weeks Of Treatment: 2 History: Peripheral Venous Disease, Type II Diabetes, Gout, Osteoarthritis, Clustered Wound: No Neuropathy Wound Measurements Length: (cm) 0.3 Width: (cm) 0.3 Depth: (cm) 0.2 Area: (cm) 0.071 Volume: (cm) 0.014 % Reduction in Area: 66.5% % Reduction in Volume: 91.8% Epithelialization: Medium (34-66%) Tunneling: No Undermining: No Wound Description Classification: Full Thickness Without Exposed Support Structures Wound Margin: Distinct, outline attached Exudate Amount: Small Exudate Type: Serosanguineous Exudate Color: red, brown Foul Odor After Cleansing: No Slough/Fibrino No Wound Bed Granulation Amount: Large (67-100%) Exposed Structure Granulation Quality: Red Fascia Exposed: No Necrotic Amount: None Present (0%) Fat Layer (Subcutaneous Tissue) Exposed: Yes Tendon Exposed: No Muscle Exposed: No Joint Exposed: No Bone Exposed: No Treatment Notes Wound #23 (Upper Leg) Wound Laterality: Left, Lateral Cleanser Peri-Wound Care Topical Primary Dressing Secondary Dressing Zetuvit Plus Silicone Border Dressing 7x7(in/in) Discharge Instruction: Apply silicone border over primary dressing as  directed. Secured With Compression Wrap Compression Stockings Environmental education officer) Signed: 08/07/2021 5:31:57 PM By: Lorrin Jackson Entered By: Lorrin Jackson on 08/07/2021 09:10:34 -------------------------------------------------------------------------------- Vitals Details Patient Name: Date of Service: [name]. 08/07/2021 9:00 A M Medical Record Number: [id number] Patient Account Number: [id number] Date of Birth/Sex: Treating RN: 10/25/1950 (71 y.o. [name] Primary Care Apostolos Blagg: [name] Other Clinician: Referring Gilma Bessette: Treating Leahmarie Gasiorowski/Extender: Cornelious Bryant in Treatment: 16 Vital Signs Time Taken: 09:08 Temperature (F): 98.6 Height (in): 74 Pulse (bpm): 73 Weight (lbs): 238 Respiratory Rate (breaths/min): 18 Body Mass Index (BMI): 30.6 Blood Pressure (mmHg): 156/84 Capillary Blood Glucose (mg/dl): 316 Reference Range: 80 - 120 mg / dl Electronic Signature(s) Signed: 08/07/2021 5:31:57 PM By: Lorrin Jackson Entered By: Lorrin Jackson on 08/07/2021 09:09:05

## 2021-08-10 ENCOUNTER — Other Ambulatory Visit: Payer: Self-pay

## 2021-08-10 ENCOUNTER — Encounter (HOSPITAL_BASED_OUTPATIENT_CLINIC_OR_DEPARTMENT_OTHER): Payer: Medicare Other | Admitting: Internal Medicine

## 2021-08-10 DIAGNOSIS — E11621 Type 2 diabetes mellitus with foot ulcer: Secondary | ICD-10-CM | POA: Diagnosis not present

## 2021-08-10 NOTE — Progress Notes (Signed)
JADRIEL, SAXER ([id number]) Visit Report for 08/10/2021 HPI Details Patient Name: Date of Service: [name], [name] 08/10/2021 11:00 A M Medical Record Number: [id number] Patient Account Number: [id number] Date of Birth/Sex: Treating RN: 06/10/1951 (71 y.o. [name] Primary Care Provider: [name] Other Clinician: Referring Provider: Treating Provider/Extender: Stormy Card in Treatment: 17 History of Present Illness HPI Description: 10/11/17; [name] is a 71 year old man who tells me that in 2015 he slipped down the latter traumatizing his left leg. He developed a wound in the same spot the area that we are currently looking at. He states this closed over for the most part although he always felt it was somewhat unstable. In 2016 he hit the same area with the door of his car had this reopened. He tells me that this is never really closed although sometimes an inflow it remains open on a constant basis. He has not been using any specific dressing to this except for topical antibiotics the nature of which were not really sure. His primary doctor did send him to see Dr. [name] of interventional cardiology. He underwent an angiogram on 08/06/17 and he underwent a PTA and directional atherectomy of the lesser distal SFA and popliteal arteries which resulted in brisk improvement in blood flow. It was noted that he had 2 vessel runoff through the anterior tibial and peroneal. He is also been to see vascular and interventional radiologist. He was not felt to have any significant superficial venous insufficiency. Presumably is not a candidate for any ablation. It was suggested he come here for wound care. The patient is a type II diabetic on insulin. He also has a history of venous insufficiency. ABIs on the left were noncompressible in our clinic 10/21/17; patient we admitted to the clinic last week. 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 We put Iodosorb on him after an aggressive debridement and 3 layer compression. He complained of pain in his ankle and itching with is skin in fact he scratched the area on the medial calf superiorly at the rim of our wraps and he has 2 small open areas in that location today which are new. I changed his primary dressing today to silver collagen. As noted he is already had revascularization and does not have any significant superficial venous insufficiency that would be amenable to ablation 10/28/17; patient admitted to the clinic 2 weeks ago. He has a smaller Wound. Scratch injury from last week revealed. There is large wound over the tibial area. This is smaller. Granulation looks healthy. No need for debridement. 11/04/17; the wound on the left lateral calf looks better. Improved dimensions. Surface of this looks better. We've been maintaining him and Kerlix Coban wraps. He finds this much more comfortable. Silver collagen dressing 11/11/17; left lateral Wound continues to look healthy be making progress. Using a #5 curet I removed removed nonviable skin from the surface of the wound and then necrotic debris from the wound surface. Surface of the wound continues to look healthy. He also has an open area on the left great toenail bed. We've been using topical antibiotics. 11/19/17; left anterior lateral wound continues to look healthy but it's not closed. He also had a small wound above this on the left leg Initially traumatic wounds in the setting of significant chronic venous insufficiency and stasis dermatitis 11/25/17; left anterior wounds superiorly is closed still a small wound inferiorly.  12/02/17; left anterior tibial area. Arrives today with adherent callus. Post debridement clearly not completely closed. Hydrofera Blue under 3 layer compression. 12/09/17; left anterior tibia. Circumferential eschar however the wound bed looks stable to improved. We've been  using Hydrofera Blue under 3 layer compression 12/17/17; left anterior tibia. Apparently this was felt to be closed however when the wrap was taken off there is a skin tear to reopen wounds in the same area we've been using Hydrofera Blue under 3 layer compression 12/23/17 left anterior tibia. Not close to close this week apparently the [facility] was stuck to this again. Still circumferential eschar requiring debridement. I put a contact layer on this this time under the Hydrofera Blue 12/31/17; left anterior tibia. Wound is better slight amount of hyper-granulation. Using Hydrofera Blue over Adaptic. 01/07/18; left anterior tibia. The wound had some surface eschar however after this was removed he has no open wound.he was already revascularized by Dr. [name] when he came to our clinic with atherectomy of the left SFA and popliteal artery. He was also sent to interventional radiology for venous reflux studies. He was not felt to have significant reflux but certainly has chronic venous changes of his skin with hemosiderin deposition around this area. He will definitely need to lubricate his skin and wear compression stocking and I've talked to him about this. READMISSION 05/26/2018 This is a now 71 year old man we cared for with traumatic wounds on his left anterior lower extremity. He had been previously revascularized during that admission by Dr. [name]. Apparently in follow-up Dr. [name] noted that he had deterioration in his arterial status. He underwent a stent placement in the distal left SFA on 04/22/2018. 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 He went back to the angiography suite on 04/30/2018 he underwent PTA and balloon angioplasty of the occluded left mid anterior tibial artery, thrombotic occlusion went from 100 to 0% which reconstitutes the posterior tibial artery. He had thrombectomy and aspiration of the peroneal artery. The stent placed in the distal SFA left SFA was  still occluded. 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 He has been recuperating at [place] home in [facility] after the most recent hospitalization. He is going home tomorrow. He tells me that 2 weeks ago he traumatized the tip of his left fifth toe. He came in urgently for our review of this. This was a history of before I noted that Dr. [name] had already noted dry gangrenous changes of the left fifth toe 06/09/2018; 2-week follow-up. I did contact Dr. [name] after his last appointment and he apparently saw 1 of Dr. Irven Shelling colleagues the next day. He does not follow-up with Dr. [name] himself until Thursday of this week. 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 Nevertheless there is no evidence of infection no drainage and no pain. He had a new area that this week when we were signing him in today on the left anterior mid tibia area, this is in close proximity to the previous wound we have dealt with in this clinic. 06/23/2018; 2-week follow-up. I did not receive a recent note from Dr. [name] to review today. Our office is trying to obtain this. He is  apparently not planning to do further vascular interventions and wondered about compression to try and help with the patient's chronic venous insufficiency. However we are also concerned about the arterial flow. He arrives in clinic today with a new area on the left third toe. The areas on the calf/anterior tibia are close to closing. The left fifth toe is still mummified using Betadine. -In reviewing things with the patient he has what sounds like claudication with mild to moderate amount of activity. 06/27/2018; x-ray of his foot suggested osteomyelitis of the left third  toe. I prescribed Levaquin over the phone while we attempted to arrange a plan of care. However the patient called yesterday to report he had low-grade fever and he came in today acutely. There is been a marked deterioration in the left third toe with spreading cellulitis up into the dorsal left foot. He was referred to the emergency room. Readmission: 06/29/2020 patient presents today for reevaluation here in our clinic he was previously treated by Dr. Dellia Nims at the latter part of 2019 in 2 the beginning of 2020. Subsequently we have not seen him since that time in the interim he did have evaluation with vein and vascular specialist specifically Dr. Anice Paganini who did perform quite extensive work for a left femoral to anterior tibial artery bypass. With that being said in the interim the patient has developed significant lymphedema and has wounds that he tells me have really never healed in regard to the incision site on the left leg. He also has multiple wounds on the feet for various reasons some of which is that he tends to pick at his feet. Fortunately there is no signs of active infection systemically at this time he does have some wounds that are little bit deeper but most are fairly superficial he seems to have good blood flow and overall everything appears to be healthy I see no bone exposed and no obvious signs of osteomyelitis. I do not know that he necessarily needs a x-ray at this point although that something we could consider depending on how things progress. The patient does have a history of lymphedema, diabetes, this is type II, chronic kidney disease stage III, hypertension, and history of peripheral vascular disease. 07/05/2020; patient admitted last week. Is a patient I remember from 2019 he had a spreading infection involving the left foot and we sent him to the hospital. 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 He subsequently had a left  femoral to anterior tibial bypass by Dr.Cain vein and vascular. He also has severe lymphedema with chronic skin changes related to that on the left leg. The most problematic area that was new today was on the left medial great toe. This was apparently a small area last week there was purulent drainage which our intake nurse cultured. Also areas on the left medial foot and heel left lateral foot. He has 2 areas on the left medial calf left lateral calf in the setting of the severe lymphedema. 07/13/2020 on evaluation today patient appears to be doing better in my opinion compared to his last visit. The good news is there is no signs of active infection systemically and locally I do not see any signs of infection either. He did have an x-ray which was negative that is great news he had a culture which showed MRSA but at the same time he is been on the doxycycline which has helped. I do think we  may want to extend this for 7 additional days 1/25; patient admitted to the clinic a few weeks ago. 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 We have been putting him under compression his edema control is a lot better but he is severe verricused skin on the left leg. He is really done quite well he still has an open area on the left medial calf and the left medial first metatarsal head. We have been using silver collagen on the leg silver alginate on the foot 07/27/2020 upon evaluation today patient appears to be doing decently well in regard to his wounds. He still has a lot of dry skin on the left leg. Some of this is starting to peel back and I think he may be able to have them out by removing some that today. Fortunately there is no signs of active infection at this time on the left leg although on the right leg he does appear to have swelling and erythema as well as some mild warmth to touch. This does have been concerned about the possibility of cellulitis although  within the differential diagnosis I do think that potentially a DVT has to be at least considered. We need to rule that out before proceeding would just call in the cellulitis. Especially since he is having pain in the posterior aspect of his calf muscle. 2/8; the patient had seen sparingly. He has severe skin changes of chronic lymphedema in the left leg thickened hyperkeratotic verrucous skin. He has an open wound on the medial part of the left first met head left mid tibia. He also has a rim of nonepithelialized skin in the anterior mid tibia. He brought in the AmLactin lotion that was been prescribed although I am not sure under compression and its utility. There concern about cellulitis on the right lower leg the last time he was here. He was put on on antibiotics. His DVT rule out was negative. The right leg looks fine he is using his stocking on this area 08/10/2020 upon evaluation today patient appears to be doing well with regard to his leg currently. He has been tolerating the dressing changes without complication. Fortunately there is no signs of active infection which is great news. Overall very pleased with where things stand. 2/22; the patient still has an area on the medial part of the left first met his head. This looks better than when I last saw this earlier this month he has a rim of epithelialization but still some surface debris. Mostly everything on the left leg is healed. There is still a vulnerable in the left mid tibia area. 08/30/2020 upon evaluation today patient appears to be doing much better in regard to his wounds on his foot. 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 Nonetheless I think we will address that today I Minna send in a prescription for him in that regard. Overall though there does not appear to be any signs of significant  worsening. 09/07/2020 on evaluation today patient's wounds over his left foot appear to be doing excellent. I do not see any signs of infection there is some callus buildup this can require debridement for certain but overall I feel like he is managing quite nicely. He still using the AmLactin cream which has been beneficial for him as well. 3/22; left  foot wound is closed. There is no open area here. He is using ammonium lactate lotion to the lower extremities to help exfoliate dry cracked skin. He has compression stockings from elastic therapy in [place]. The wound on the medial part of his left first met head is healed today. READMISSION 04/12/2021 [name] is a patient we know fairly well he had a prolonged stay in clinic in 2019 with wounds on his left lateral and left anterior lower extremity in the setting of chronic venous insufficiency. More recently he was here earlier this year with predominantly an area on his left foot first metatarsal head plantar and he says the plantar foot broke down on its not long after we discharged him but he did not come back here. The last few months areas of broken down on his left anterior and again the left lateral lower extremity. The leg itself is very swollen chronically enlarged a lot of hyperkeratotic dry Berry Q skin in the left lower leg. His edema extends well into the thigh. He was seen by Dr. Donzetta Matters. He had ABIs on 03/02/2021 showing an ABI on the right of 1 with a TBI of 0.72 his ABI in the left at 1.09 TBI of 0.99. Monophasic and biphasic waveforms on the right. On the left monophasic waveforms were noted he went on to have an angiogram on 03/27/2021 this showed the aortic aortic and iliac segments were free of flow-limiting stenosis the left common femoral vein to evaluate the left femoral to anterior tibial artery bypass was unobstructed the bypass was patent without any areas of stenosis. We discharged the patient in bilateral juxta lite  stockings but very clearly that was not sufficient to control the swelling and maintain skin integrity. He is clearly going to need compression pumps. 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 This is fortunate because he is on his feet for long periods of time. 10/27; patient comes in with our intake nurse reporting copious amount of green drainage from the left anterior mid tibia the left dorsal foot and to a lesser extent the left medial mid tibia. We left the compression wrap on all week for the amount of edema in his left leg is quite a bit better. We use silver alginate as the primary dressing 11/3; edema control is good. Left anterior lower leg left medial lower leg and the plantar first metatarsal head. The left anterior lower leg required debridement. Deep tissue culture I did of this wound showed MRSA I put him on 10 days of doxycycline which she will start today. We have him in compression wraps. He has a security card and [employer] however he is retiring on November 15. We will need to then get him into a better offloading boot for the left foot perhaps a total contact cast 11/10; edema control is quite good. Left anterior and left medial lower leg wounds in the setting of chronic venous insufficiency and lymphedema. He also has a substantial area over the left plantar first metatarsal head. 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 He has significant hypergranulation on the left plantar foot wound. The patient is a diabetic but he does not have significant PAD 11/17; edema control is quite good. Left anterior and left medial lower leg wounds look better. The really concerning area remains the  area on the left plantar first metatarsal head. He has a rim of epithelialization. He has been using a surgical shoe The patient is now retired from a a  [employer] I have gone over with him the need to offload this area aggressively. Starting today with a forefoot off loader but . possibly a total contact cast. 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 Arrives with a very significant odor from I think all of the wounds on the left leg and the left foot. Because of this I did not put a total contact cast on him today but will could still consider this. His wife was having cataract surgery which is the reason he missed the appointment 12/6. I saw this man 5 days ago with a swelling below the popliteal fossa. I thought he actually might have a Baker's cyst however the DVT rule out study that we could arrange right away was negative the technician told me this was not a ruptured Baker's cyst. We attempted to get this aspirated by under ultrasound guidance in interventional radiology however all they did was an ultrasound however it shows an extensive fluid collection 62 x 8 x 9.4 in the left thigh and left calf. The patient states he thinks this started 8 days ago or so but he really is not complaining of any pain, fever or systemic symptoms. He has not ha 12/20; after some difficulty I managed to get the patient into see Dr. Donzetta Matters. Eventually he was taken into the hospital and had a drain put in the fluid collection below his left knee posteriorly extending into the posterior thigh. He still has the drain in place. Culture of this showed moderate staff aureus few Morganella and few Klebsiella he is now on doxycycline and ciprofloxacin as suggested by infectious disease he is on this for a month. The drain will remain in place until it stops draining 12/29; he comes in today with the 1 wound on his left leg and the area on the left plantar first met head significantly smaller. Both look healthy. He still has the drain in the left leg. He says  he has to change this daily. Follows up with Dr. Donzetta Matters on January 11. 06/29/2021; the wounds that I am following on the left leg and left first met head continued to be quite healthy. However the area where his inferior drain is in place had copious amounts of drainage which was green in color. The wound here is larger. Follows up with Dr. Gwenlyn Saran of vein and vascular his surgeon next week as well as infectious disease. He remains on ciprofloxacin and doxycycline. He is not complaining of excessive pain in either one of the drain areas 1/12; the patient saw vascular surgery and infectious disease. Vascular surgery has left the drain in place as there was still some notable drainage still see him back in 2 weeks. Dr. Velna Ochs stop the doxycycline and ciprofloxacin and I do not believe he follows up with them at this point. Culture I did last week showed both doxycycline resistant MRSA and Pseudomonas not sensitive to ciprofloxacin although only in rare titers 1/19; the patient's wound on the left anterior lower leg is just about healed. We have continued healing of the area that was medially on the left leg. Left first plantar metatarsal head continues to get smaller. The major problem  here is his 2 drain sites 1 on the left upper calf and lateral thigh. There is purulent drainage still from the left lateral thigh. I gave him antibiotics last week but we still have recultured. He has the drain in the area I think this is eventually going to have to come out. I suspect there will be a connecting wound to heal here perhaps with improved VAc 1/26; the patient had his drain removed by vein and vascular on 1/25/. This was a large pocket of fluid in his left thigh that seem to tunnel into his left upper calf. 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 His mention his Penrose drain was removed today. He now has a tunneling wound on his left calf and left thigh. Both of these probe widely towards each  other although I cannot really prove that they connect. Both wounds on his lower leg anteriorly are closed and his area over the first metatarsal head on his right foot continues to improve. We are using Hydrofera Blue here. He also saw infectious disease culture of the abscess they noted was polymicrobial with MRSA, Morganella and Klebsiella he was treated with doxycycline and ciprofloxacin for 4 weeks ending on 07/03/2021. They did not recommend any further antibiotics. Notable that while he still had the Penrose drain in place last week he had purulent drainage coming out of the inferior IandD site this grew [facility], MRSA and Pseudomonas but there does not appear to be any active infection in this area today with the drain out and he is not systemically unwell 2/2; with regards to the drain sites the superior one on the thigh actually is closed down the one on the upper left lateral calf measures about 8 and half centimeters which is an improvement seems to be less prominent although still with a lot of drainage. The only remaining wound is over the first metatarsal head on the left foot and this looks to be continuing to improve with Hydrofera Blue. 2/9; the area on his plantar left foot continues to contract. Callus around the wound edge. The drain sites specifically have not come down in depth. We put the wound VAC on Monday he changed the canister late last night our intake nurse reported a pocket of fluid perhaps caused by our compression wraps 2/6; continued improvement in left foot plantar wound. drainage site in the calf is not improved in terms of depth (wound vac) Electronic Signature(s) Signed: 08/10/2021 5:55:17 PM By: Linton Ham MD Entered By: Linton Ham on 08/10/2021 12:45:24 -------------------------------------------------------------------------------- Physical Exam Details Patient Name: Date of Service: [name] Garfinkel. 08/10/2021 11:00 A M Medical Record Number:  [id number] Patient Account Number: [id number] Date of Birth/Sex: Treating RN: Jul 15, 1950 (71 y.o. [name] Primary Care Provider: [name] Other Clinician: Referring Provider: Treating Provider/Extender: Stormy Card in Treatment: 11 Constitutional Patient is hypertensive.. Pulse regular and within target range for patient.Marland Kitchen Respirations regular, non-labored and within target range.. Temperature is normal and within the target range for the patient.Marland Kitchen Appears in no distress. Notes wound exam; left lower leg wounds remain closed. the area on his left thigh is closed. drain site left calf still with more depth than I cna easiy measure. no overt infection or reaccumulation of the fluid/abscess. changes vac cannister 2 x per week Electronic Signature(s) Signed: 08/10/2021 5:55:17 PM By: Linton Ham MD Entered By: Linton Ham on 08/10/2021 12:47:35 -------------------------------------------------------------------------------- Physician Orders Details Patient Name: Date of Service:  [name], [name] W. 08/10/2021 11:00 A M Medical Record Number: [id number] Patient Account Number: [id number] Date of Birth/Sex: Treating RN: [birth year] (71 y.o. [name] Primary Care Provider: [name] Other Clinician: Referring Provider: Treating Provider/Extender: Stormy Card in Treatment: [phone] Verbal / Phone Orders: No Diagnosis Coding Follow-up Appointments ppointment in 1 week. Dellia Nims on Thursday room 2 Return A Nurse Visit: - Monday for [facility] change Bathing/ Shower/ Hygiene May shower with protection but do not get wound dressing(s) wet. - Use a cast protector so you can shower without getting your wrap(s) wet Negative Presssure Wound Therapy Wound #22 Left,Lateral Lower Leg Wound Vac to wound continuously at 124mm/hg pressure - change 2 times per week in clinic Black Foam Sterile Gauze Packing - in tunnel Edema Control -  Lymphedema / SCD / Other Elevate legs to the level of the heart or above for 30 minutes daily and/or when sitting, a frequency of: - throughout the day Avoid standing for long periods of time. Patient to wear own compression stockings every day. - on right leg; Moisturize legs daily. - Ammonium LACTATE to BLE every day. Off-Loading Wedge shoe to: - left front foot offloader Wound Treatment Wound #18 - Metatarsal head first Wound Laterality: Plantar, Left Cleanser: Soap and Water 2 x Per Week/7 Days Discharge Instructions: May shower and wash wound with dial antibacterial soap and water prior to dressing change. Cleanser: Wound Cleanser 2 x Per Week/7 Days Discharge Instructions: Cleanse the wound with wound cleanser prior to applying a clean dressing using gauze sponges, not tissue or cotton balls. Peri-Wound Care: Triamcinolone 15 (g) 2 x Per Week/7 Days Discharge Instructions: Use triamcinolone 15 (g) as directed Peri-Wound Care: Sween Lotion (Moisturizing lotion) 2 x Per Week/7 Days Discharge Instructions: Apply moisturizing lotion as directed Prim Dressing: Hydrofera Blue Ready Foam, 2.5 x2.5 in 2 x Per Week/7 Days ary Discharge Instructions: Apply to wound bed as instructed Secondary Dressing: ABD Pad, 5x9 2 x Per Week/7 Days Discharge Instructions: Apply over primary dressing as directed. Compression Wrap: CoFlex TLC XL 2-layer Compression System 4x7 (in/yd) 2 x Per Week/7 Days Discharge Instructions: Apply CoFlex 2-layer compression as directed. (alt for 4 layer) Wound #19 - Lower Leg Wound Laterality: Left, Anterior Cleanser: Soap and Water 2 x Per Week/7 Days Discharge Instructions: May shower and wash wound with dial antibacterial soap and water prior to dressing change. Cleanser: Wound Cleanser 2 x Per Week/7 Days Discharge Instructions: Cleanse the wound with wound cleanser prior to applying a clean dressing using gauze sponges, not tissue or cotton balls. Peri-Wound Care:  Triamcinolone 15 (g) 2 x Per Week/7 Days Discharge Instructions: Use triamcinolone 15 (g) as directed Peri-Wound Care: Sween Lotion (Moisturizing lotion) 2 x Per Week/7 Days Discharge Instructions: Apply moisturizing lotion as directed Prim Dressing: Hydrofera Blue Ready Foam, 2.5 x2.5 in 2 x Per Week/7 Days ary Discharge Instructions: Apply to wound bed as instructed Secondary Dressing: Woven Gauze Sponge, Non-Sterile 4x4 in 2 x Per Week/7 Days Discharge Instructions: Apply over primary dressing as directed. Compression Wrap: CoFlex TLC XL 2-layer Compression System 4x7 (in/yd) 2 x Per Week/7 Days Discharge Instructions: Apply CoFlex 2-layer compression as directed. (alt for 4 layer) Wound #22 - Lower Leg Wound Laterality: Left, Lateral Cleanser: Soap and Water 2 x Per Week/7 Days Discharge Instructions: May shower and wash wound with dial antibacterial soap and water prior to dressing change. Cleanser: Wound Cleanser 2 x Per Week/7 Days Discharge Instructions: Cleanse the wound with  wound cleanser prior to applying a clean dressing using gauze sponges, not tissue or cotton balls. Peri-Wound Care: Triamcinolone 15 (g) 2 x Per Week/7 Days Discharge Instructions: Use triamcinolone 15 (g) as directed Peri-Wound Care: Sween Lotion (Moisturizing lotion) 2 x Per Week/7 Days Discharge Instructions: Apply moisturizing lotion as directed Prim Dressing: VAC ary 2 x Per Week/7 Days Compression Wrap: CoFlex TLC XL 2-layer Compression System 4x7 (in/yd) 2 x Per Week/7 Days Discharge Instructions: Apply CoFlex 2-layer compression as directed. (alt for 4 layer) Wound #23 - Upper Leg Wound Laterality: Left, Lateral Secondary Dressing: Zetuvit Plus Silicone Border Dressing 7x7(in/in) 2 x Per Week Discharge Instructions: Apply silicone border over primary dressing as directed. Electronic Signature(s) Signed: 08/10/2021 5:28:56 PM By: Baruch Gouty RN, BSN Signed: 08/10/2021 5:55:17 PM By: Linton Ham  MD Entered By: Baruch Gouty on 08/10/2021 12:20:59 -------------------------------------------------------------------------------- Problem List Details Patient Name: Date of Service: [name] Garfinkel. 08/10/2021 11:00 A M Medical Record Number: [id number] Patient Account Number: [id number] Date of Birth/Sex: Treating RN: 1951/02/25 (71 y.o. [name] Primary Care Provider: [name] Other Clinician: Referring Provider: Treating Provider/Extender: Stormy Card in Treatment: 17 Active Problems ICD-10 Encounter Code Description Active Date MDM Diagnosis E11.621 Type 2 diabetes mellitus with foot ulcer 04/12/2021 No Yes 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 04/12/2021 No Yes I89.0 Lymphedema, not elsewhere classified 04/12/2021 No Yes I87.322 Chronic venous hypertension (idiopathic) with inflammation of left lower 04/12/2021 No Yes extremity 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 ulcer of other part of left lower leg with other specified 04/12/2021 No Yes severity 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 of other part of left foot with other specified 04/12/2021 No Yes severity 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 07/20/2021 No Yes Inactive Problems ICD-10 Code Description Active Date Inactive Date E11.42 Type 2 diabetes mellitus with diabetic polyneuropathy 04/12/2021 04/12/2021 L02.416 Cutaneous abscess of left lower limb 06/13/2021 06/13/2021 Resolved Problems Electronic Signature(s) Signed: 08/10/2021 5:55:17 PM By: Linton Ham MD Entered By: Linton Ham on 08/10/2021 12:42:30 -------------------------------------------------------------------------------- Progress Note Details Patient Name: Date of Service: [name] Garfinkel. 08/10/2021 11:00 A M Medical Record Number: [id number] Patient Account Number: [id number] Date of Birth/Sex: Treating RN: 1951-04-07 (71  y.o. [name] Primary Care Provider: [name] Other Clinician: Referring Provider: Treating Provider/Extender: Stormy Card in Treatment: 17 Subjective History of Present Illness (HPI) 10/11/17; [name] is a 71 year old man who tells me that in 2015 he slipped down the latter traumatizing his left leg. He developed a wound in the same spot the area that we are currently looking at. He states this closed over for the most part although he always felt it was somewhat unstable. In 2016 he hit the same area with the door of his car had this reopened. He tells me that this is never really closed although sometimes an inflow it remains open on a constant basis. He has not been using any specific dressing to this except for topical antibiotics the nature of which were not really sure. His primary doctor did send him to see Dr. [name] of interventional cardiology. He underwent an angiogram on 08/06/17 and he underwent a PTA and directional atherectomy of the lesser distal SFA and popliteal arteries which resulted in brisk improvement in blood flow. It was noted that he had 2 vessel runoff through the anterior tibial and peroneal. He is also been to see vascular and interventional radiologist. He was not  felt to have any significant superficial venous insufficiency. Presumably is not a candidate for any ablation. It was suggested he come here for wound care. The patient is a type II diabetic on insulin. He also has a history of venous insufficiency. ABIs on the left were noncompressible in our clinic 10/21/17; patient we admitted to the clinic last week. 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 We put Iodosorb on him after an aggressive debridement and 3 layer compression. He complained of pain in his ankle and itching with is skin in fact he scratched the area on the medial calf superiorly at the rim of  our wraps and he has 2 small open areas in that location today which are new. I changed his primary dressing today to silver collagen. As noted he is already had revascularization and does not have any significant superficial venous insufficiency that would be amenable to ablation 10/28/17; patient admitted to the clinic 2 weeks ago. He has a smaller Wound. Scratch injury from last week revealed. There is large wound over the tibial area. This is smaller. Granulation looks healthy. No need for debridement. 11/04/17; the wound on the left lateral calf looks better. Improved dimensions. Surface of this looks better. We've been maintaining him and Kerlix Coban wraps. He finds this much more comfortable. Silver collagen dressing 11/11/17; left lateral Wound continues to look healthy be making progress. Using a #5 curet I removed removed nonviable skin from the surface of the wound and then necrotic debris from the wound surface. Surface of the wound continues to look healthy. ooHe also has an open area on the left great toenail bed. We've been using topical antibiotics. 11/19/17; left anterior lateral wound continues to look healthy but it's not closed. ooHe also had a small wound above this on the left leg ooInitially traumatic wounds in the setting of significant chronic venous insufficiency and stasis dermatitis 11/25/17; left anterior wounds superiorly is closed still a small wound inferiorly. 12/02/17; left anterior tibial area. Arrives today with adherent callus. Post debridement clearly not completely closed. Hydrofera Blue under 3 layer compression. 12/09/17; left anterior tibia. Circumferential eschar however the wound bed looks stable to improved. We've been using Hydrofera Blue under 3 layer compression 12/17/17; left anterior tibia. Apparently this was felt to be closed however when the wrap was taken off there is a skin tear to reopen wounds in the same area we've been using Hydrofera Blue under  3 layer compression 12/23/17 left anterior tibia. Not close to close this week apparently the [facility] was stuck to this again. Still circumferential eschar requiring debridement. I put a contact layer on this this time under the Hydrofera Blue 12/31/17; left anterior tibia. Wound is better slight amount of hyper-granulation. Using Hydrofera Blue over Adaptic. 01/07/18; left anterior tibia. The wound had some surface eschar however after this was removed he has no open wound.he was already revascularized by Dr. [name] when he came to our clinic with atherectomy of the left SFA and popliteal artery. He was also sent to interventional radiology for venous reflux studies. He was not felt to have significant reflux but certainly has chronic venous changes of his skin with hemosiderin deposition around this area. He will definitely need to lubricate his skin and wear compression stocking and I've talked to him about this. READMISSION 05/26/2018 This is a now 71 year old man we cared for with traumatic wounds on his left anterior lower  extremity. He had been previously revascularized during that admission by Dr. [name]. Apparently in follow-up Dr. [name] noted that he had deterioration in his arterial status. He underwent a stent placement in the distal left SFA on 04/22/2018. 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 He went back to the angiography suite on 04/30/2018 he underwent PTA and balloon angioplasty of the occluded left mid anterior tibial artery, thrombotic occlusion went from 100 to 0% which reconstitutes the posterior tibial artery. He had thrombectomy and aspiration of the peroneal artery. The stent placed in the distal SFA left SFA was still occluded. 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 Noninvasive studies on  05/02/2018 showed an TBI on the left at 0.43 and 0.82 on the right. He has been recuperating at [place] [facility] in [facility] after the most recent hospitalization. He is going home tomorrow. He tells me that 2 weeks ago he traumatized the tip of his left fifth toe. He came in urgently for our review of this. This was a history of before I noted that Dr. [name] had already noted dry gangrenous changes of the left fifth toe 06/09/2018; 2-week follow-up. I did contact Dr. [name] after his last appointment and he apparently saw 1 of Dr. Irven Shelling colleagues the next day. He does not follow-up with Dr. [name] himself until Thursday of this week. 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 Nevertheless there is no evidence of infection no drainage and no pain. He had a new area that this week when we were signing him in today on the left anterior mid tibia area, this is in close proximity to the previous wound we have dealt with in this clinic. 06/23/2018; 2-week follow-up. I did not receive a recent note from Dr. [name] to review today. Our office is trying to obtain this. He is apparently not planning to do further vascular interventions and wondered about compression to try and help with the patient's chronic venous insufficiency. However we are also concerned about the arterial flow. ooHe arrives in clinic today with a new area on the left third toe. The areas on the calf/anterior tibia are close to closing. The left fifth toe is still mummified using Betadine. -In reviewing things with the patient he has what sounds like claudication with mild to moderate amount of activity. 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 I prescribed Levaquin over the phone while we attempted to arrange a plan of care. However the patient called yesterday to report he had low-grade fever and he came in today acutely. There is been a marked deterioration in the left third  toe with spreading cellulitis up into the dorsal left foot. He was referred to the emergency room. Readmission: 06/29/2020 patient presents today for reevaluation here in our clinic he was previously treated by Dr. Dellia Nims at the latter part of 2019 in 2 the beginning of 2020. Subsequently we have not seen him since that time in the interim he did have evaluation with vein and vascular specialist specifically Dr. Anice Paganini who did perform quite extensive work for a left femoral to anterior tibial artery bypass. With that being said in the interim the patient has developed significant lymphedema and has wounds that he tells me have really never healed in regard  to the incision site on the left leg. He also has multiple wounds on the feet for various reasons some of which is that he tends to pick at his feet. Fortunately there is no signs of active infection systemically at this time he does have some wounds that are little bit deeper but most are fairly superficial he seems to have good blood flow and overall everything appears to be healthy I see no bone exposed and no obvious signs of osteomyelitis. I do not know that he necessarily needs a x-ray at this point although that something we could consider depending on how things progress. The patient does have a history of lymphedema, diabetes, this is type II, chronic kidney disease stage III, hypertension, and history of peripheral vascular disease. 07/05/2020; patient admitted last week. Is a patient I remember from 2019 he had a spreading infection involving the left foot and we sent him to the hospital. 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 He subsequently had a left femoral to anterior tibial bypass by Dr.[facility]ain vein and vascular. He also has severe lymphedema with chronic skin changes related to that on the left leg. The most problematic area that was new today was on the left medial great toe. This was  apparently a small area last week there was purulent drainage which our intake nurse cultured. Also areas on the left medial foot and heel left lateral foot. He has 2 areas on the left medial calf left lateral calf in the setting of the severe lymphedema. 07/13/2020 on evaluation today patient appears to be doing better in my opinion compared to his last visit. The good news is there is no signs of active infection systemically and locally I do not see any signs of infection either. He did have an x-ray which was negative that is great news he had a culture which showed MRSA but at the same time he is been on the doxycycline which has helped. I do think we may want to extend this for 7 additional days 1/25; patient admitted to the clinic a few weeks ago. 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 We have been putting him under compression his edema control is a lot better but he is severe verricused skin on the left leg. He is really done quite well he still has an open area on the left medial calf and the left medial first metatarsal head. We have been using silver collagen on the leg silver alginate on the foot 07/27/2020 upon evaluation today patient appears to be doing decently well in regard to his wounds. He still has a lot of dry skin on the left leg. Some of this is starting to peel back and I think he may be able to have them out by removing some that today. Fortunately there is no signs of active infection at this time on the left leg although on the right leg he does appear to have swelling and erythema as well as some mild warmth to touch. This does have been concerned about the possibility of cellulitis although within the differential diagnosis I do think that potentially a DVT has to be at least considered. We need to rule that out before proceeding would just call in the cellulitis. Especially since he is having pain in the posterior aspect of  his calf muscle. 2/8; the patient had seen sparingly. He has severe skin changes  of chronic lymphedema in the left leg thickened hyperkeratotic verrucous skin. He has an open wound on the medial part of the left first met head left mid tibia. He also has a rim of nonepithelialized skin in the anterior mid tibia. He brought in the AmLactin lotion that was been prescribed although I am not sure under compression and its utility. There concern about cellulitis on the right lower leg the last time he was here. He was put on on antibiotics. His DVT rule out was negative. The right leg looks fine he is using his stocking on this area 08/10/2020 upon evaluation today patient appears to be doing well with regard to his leg currently. He has been tolerating the dressing changes without complication. Fortunately there is no signs of active infection which is great news. Overall very pleased with where things stand. 2/22; the patient still has an area on the medial part of the left first met his head. This looks better than when I last saw this earlier this month he has a rim of epithelialization but still some surface debris. Mostly everything on the left leg is healed. There is still a vulnerable in the left mid tibia area. 08/30/2020 upon evaluation today patient appears to be doing much better in regard to his wounds on his foot. 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 Nonetheless I think we will address that today I Minna send in a prescription for him in that regard. Overall though there does not appear to be any signs of significant worsening. 09/07/2020 on evaluation today patient's wounds over his left foot appear to be doing excellent. I do not see any signs of infection there is some callus buildup this can require debridement for certain but overall I feel like he is  managing quite nicely. He still using the AmLactin cream which has been beneficial for him as well. 3/22; left foot wound is closed. There is no open area here. He is using ammonium lactate lotion to the lower extremities to help exfoliate dry cracked skin. He has compression stockings from elastic therapy in [place]. The wound on the medial part of his left first met head is healed today. READMISSION 04/12/2021 [name] is a patient we know fairly well he had a prolonged stay in clinic in 2019 with wounds on his left lateral and left anterior lower extremity in the setting of chronic venous insufficiency. More recently he was here earlier this year with predominantly an area on his left foot first metatarsal head plantar and he says the plantar foot broke down on its not long after we discharged him but he did not come back here. The last few months areas of broken down on his left anterior and again the left lateral lower extremity. The leg itself is very swollen chronically enlarged a lot of hyperkeratotic dry Berry Q skin in the left lower leg. His edema extends well into the thigh. He was seen by Dr. Donzetta Matters. He had ABIs on 03/02/2021 showing an ABI on the right of 1 with a TBI of 0.72 his ABI in the left at 1.09 TBI of 0.99. Monophasic and biphasic waveforms on the right. On the left monophasic waveforms were noted he went on to have an angiogram on 03/27/2021 this showed the aortic aortic and iliac segments were free of flow-limiting stenosis the left common  femoral vein to evaluate the left femoral to anterior tibial artery bypass was unobstructed the bypass was patent without any areas of stenosis. We discharged the patient in bilateral juxta lite stockings but very clearly that was not sufficient to control the swelling and maintain skin integrity. He is clearly going to need compression pumps. 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 This  is fortunate because he is on his feet for long periods of time. 10/27; patient comes in with our intake nurse reporting copious amount of green drainage from the left anterior mid tibia the left dorsal foot and to a lesser extent the left medial mid tibia. We left the compression wrap on all week for the amount of edema in his left leg is quite a bit better. We use silver alginate as the primary dressing 11/3; edema control is good. Left anterior lower leg left medial lower leg and the plantar first metatarsal head. The left anterior lower leg required debridement. Deep tissue culture I did of this wound showed MRSA I put him on 10 days of doxycycline which she will start today. We have him in compression wraps. He has a security card and [employer] however he is retiring on November 15. We will need to then get him into a better offloading boot for the left foot perhaps a total contact cast 11/10; edema control is quite good. Left anterior and left medial lower leg wounds in the setting of chronic venous insufficiency and lymphedema. He also has a substantial area over the left plantar first metatarsal head. 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 He has significant hypergranulation on the left plantar foot wound. The patient is a diabetic but he does not have significant PAD 11/17; edema control is quite good. Left anterior and left medial lower leg wounds look better. The really concerning area remains the area on the left plantar first metatarsal head. He has a rim of epithelialization. 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 Starting today with a forefoot off loader but . possibly a total contact cast. 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 Arrives with a very significant odor from I think all of the wounds on the left leg and the left foot. Because of this I did not put a total contact cast on him today but will could still consider this. His wife was having cataract surgery which is the reason he missed the appointment 12/6. I saw this man 5 days ago with a swelling below the popliteal fossa. I thought he actually might have a Baker's cyst however the DVT rule out study that we could arrange right away was negative the technician told me this was not a ruptured Baker's cyst. We attempted to get this aspirated by under ultrasound guidance in interventional radiology however all they did was an ultrasound however it shows an extensive fluid collection 62 x 8 x 9.4 in the left thigh and left calf. The patient states he thinks this started 8 days ago or so but he really is not complaining  of any pain, fever or systemic symptoms. He has not ha 12/20; after some difficulty I managed to get the patient into see Dr. Donzetta Matters. Eventually he was taken into the hospital and had a drain put in the fluid collection below his left knee posteriorly extending into the posterior thigh. He still has the drain in place. Culture of this showed moderate staff aureus few Morganella and few Klebsiella he is now on doxycycline and ciprofloxacin as suggested by infectious disease he is on this for a month. The drain will remain in place until it stops draining 12/29; he comes in today with the 1 wound on his left leg and the area on the left plantar first met head significantly smaller. Both look healthy. He still has the drain in the left leg. He says he has to change this daily. Follows up with Dr. Donzetta Matters on January 11. 06/29/2021; the wounds that I am following on the left leg and left first met head continued to be quite healthy. However the area where his inferior drain is in place had copious  amounts of drainage which was green in color. The wound here is larger. Follows up with Dr. Gwenlyn Saran of vein and vascular his surgeon next week as well as infectious disease. He remains on ciprofloxacin and doxycycline. He is not complaining of excessive pain in either one of the drain areas 1/12; the patient saw vascular surgery and infectious disease. Vascular surgery has left the drain in place as there was still some notable drainage still see him back in 2 weeks. Dr. Velna Ochs stop the doxycycline and ciprofloxacin and I do not believe he follows up with them at this point. Culture I did last week showed both doxycycline resistant MRSA and Pseudomonas not sensitive to ciprofloxacin although only in rare titers 1/19; the patient's wound on the left anterior lower leg is just about healed. We have continued healing of the area that was medially on the left leg. Left first plantar metatarsal head continues to get smaller. The major problem here is his 2 drain sites 1 on the left upper calf and lateral thigh. There is purulent drainage still from the left lateral thigh. I gave him antibiotics last week but we still have recultured. He has the drain in the area I think this is eventually going to have to come out. I suspect there will be a connecting wound to heal here perhaps with improved VAc 1/26; the patient had his drain removed by vein and vascular on 1/25/. This was a large pocket of fluid in his left thigh that seem to tunnel into his left upper calf. 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 His mention his Penrose drain was removed today. He now has a tunneling wound on his left calf and left thigh. Both of these probe widely towards each other although I cannot really prove that they connect. Both wounds on his lower leg anteriorly are closed and his area over the first metatarsal head on his right foot continues to improve. We are using Hydrofera Blue here. He also saw  infectious disease culture of the abscess they noted was polymicrobial with MRSA, Morganella and Klebsiella he was treated with doxycycline and ciprofloxacin for 4 weeks ending on 07/03/2021. They did not recommend any further antibiotics. Notable that while he still had the Penrose drain in place last week he had purulent drainage coming out of the inferior IandD site this grew [facility], MRSA and Pseudomonas but  there does not appear to be any active infection in this area today with the drain out and he is not systemically unwell 2/2; with regards to the drain sites the superior one on the thigh actually is closed down the one on the upper left lateral calf measures about 8 and half centimeters which is an improvement seems to be less prominent although still with a lot of drainage. The only remaining wound is over the first metatarsal head on the left foot and this looks to be continuing to improve with Hydrofera Blue. 2/9; the area on his plantar left foot continues to contract. Callus around the wound edge. The drain sites specifically have not come down in depth. We put the wound VAC on Monday he changed the canister late last night our intake nurse reported a pocket of fluid perhaps caused by our compression wraps 2/6; continued improvement in left foot plantar wound. drainage site in the calf is not improved in terms of depth (wound vac) Objective Constitutional Patient is hypertensive.. Pulse regular and within target range for patient.Marland Kitchen Respirations regular, non-labored and within target range.. Temperature is normal and within the target range for the patient.Marland Kitchen Appears in no distress. Vitals Time Taken: 11:28 AM, Height: 74 in, Weight: 238 lbs, BMI: 30.6, Temperature: 98.2 F, Pulse: 67 bpm, Respiratory Rate: 18 breaths/min, Blood Pressure: 151/90 mmHg, Capillary Blood Glucose: 133 mg/dl. General Notes: glucose per pt report General Notes: wound exam; left lower leg wounds remain  closed. the area on his left thigh is closed. drain site left calf still with more depth than I cna easiy measure. no overt infection or reaccumulation of the fluid/abscess. changes vac cannister 2 x per week Integumentary (Hair, Skin) Wound #18 status is Open. Original cause of wound was Gradually Appeared. The date acquired was: 08/23/2020. The wound has been in treatment 17 weeks. The wound is located on the Left,Plantar Metatarsal head first. The wound measures 1.3cm length x 1.7cm width x 0.1cm depth; 1.736cm^2 area and 0.174cm^3 volume. There is Fat Layer (Subcutaneous Tissue) exposed. There is no tunneling or undermining noted. There is a medium amount of serosanguineous drainage noted. The wound margin is thickened. There is large (67-100%) red, pink granulation within the wound bed. There is no necrotic tissue within the wound bed. Wound #19 status is Open. Original cause of wound was Gradually Appeared. The date acquired was: 08/23/2020. The wound has been in treatment 17 weeks. The wound is located on the Left,Anterior Lower Leg. The wound measures 0cm length x 0cm width x 0cm depth; 0cm^2 area and 0cm^3 volume. There is no tunneling or undermining noted. There is a none present amount of drainage noted. The wound margin is distinct with the outline attached to the wound base. There is no granulation within the wound bed. There is no necrotic tissue within the wound bed. Wound #22 status is Open. Original cause of wound was Bump. The date acquired was: 06/03/2021. The wound has been in treatment 9 weeks. The wound is located on the Left,Lateral Lower Leg. The wound measures 2.2cm length x 1cm width x 0.7cm depth; 1.728cm^2 area and 1.21cm^3 volume. There is Fat Layer (Subcutaneous Tissue) exposed. There is no undermining noted, however, there is tunneling at 12:00 with a maximum distance of 13cm. There is a large amount of sanguinous drainage noted. The wound margin is distinct with the outline  attached to the wound base. There is large (67-100%) red granulation within the wound bed. There is no necrotic tissue  within the wound bed. Wound #23 status is Open. Original cause of wound was Laceration. The date acquired was: 06/05/2021. The wound has been in treatment 3 weeks. The wound is located on the Left,Lateral Upper Leg. The wound measures 0cm length x 0cm width x 0cm depth; 0cm^2 area and 0cm^3 volume. There is no tunneling or undermining noted. There is a none present amount of drainage noted. The wound margin is distinct with the outline attached to the wound base. There is no granulation within the wound bed. There is no necrotic tissue within the wound bed. 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 There was a Double Layer Compression Therapy Procedure by Baruch Gouty, RN. Post procedure Diagnosis Wound #22: Same as Pre-Procedure Plan Follow-up Appointments: Return Appointment in 1 week. Dellia Nims on Thursday room 2 Nurse Visit: - Monday for [facility] change Bathing/ Shower/ Hygiene: May shower with protection but do not get wound dressing(s) wet. - Use a cast protector so you can shower without getting your wrap(s) wet Negative Presssure Wound Therapy: Wound #22 Left,Lateral Lower Leg: Wound Vac to wound continuously at 177mm/hg pressure - change 2 times per week in clinic Black Foam Sterile Gauze  Packing - in tunnel Edema Control - Lymphedema / SCD / Other: Elevate legs to the level of the heart or above for 30 minutes daily and/or when sitting, a frequency of: - throughout the day Avoid standing for long periods of time. Patient to wear own compression stockings every day. - on right leg; Moisturize legs daily. - Ammonium LACTATE to BLE every day. Off-Loading: Wedge shoe to: - left front foot offloader WOUND #18: - Metatarsal head first Wound Laterality: Plantar, Left Cleanser: Soap and Water 2 x Per Week/7 Days Discharge Instructions: May shower and wash wound with dial antibacterial soap and water prior to dressing change. Cleanser: Wound Cleanser 2 x Per Week/7 Days Discharge Instructions: Cleanse the wound with wound cleanser prior to applying a clean dressing using gauze sponges, not tissue or cotton balls. Peri-Wound Care: Triamcinolone 15 (g) 2 x Per Week/7 Days Discharge Instructions: Use triamcinolone 15 (g) as directed Peri-Wound Care: Sween Lotion (Moisturizing lotion) 2 x Per Week/7 Days Discharge Instructions: Apply moisturizing lotion as directed Prim Dressing: Hydrofera Blue Ready Foam, 2.5 x2.5 in 2 x Per Week/7 Days ary Discharge Instructions: Apply to wound bed as instructed Secondary Dressing: ABD Pad, 5x9 2 x Per Week/7 Days Discharge Instructions: Apply over primary dressing as directed. Com pression Wrap: CoFlex TLC XL 2-layer Compression System 4x7 (in/yd) 2 x Per Week/7 Days Discharge Instructions: Apply CoFlex 2-layer compression as directed. (alt for 4 layer) WOUND #19: - Lower Leg Wound Laterality: Left, Anterior Cleanser: Soap and Water 2 x Per Week/7 Days Discharge Instructions: May shower and wash wound with dial antibacterial soap and water prior to dressing change. Cleanser: Wound Cleanser 2 x Per Week/7 Days Discharge Instructions: Cleanse the wound with wound cleanser prior to applying a clean dressing using gauze sponges, not tissue or cotton  balls. Peri-Wound  Care: Triamcinolone 15 (g) 2 x Per Week/7 Days Discharge Instructions: Use triamcinolone 15 (g) as directed Peri-Wound Care: Sween Lotion (Moisturizing lotion) 2 x Per Week/7 Days Discharge Instructions: Apply moisturizing lotion as directed Prim Dressing: Hydrofera Blue Ready Foam, 2.5 x2.5 in 2 x Per Week/7 Days ary Discharge Instructions: Apply to wound bed as instructed Secondary Dressing: Woven Gauze Sponge, Non-Sterile 4x4 in 2 x Per Week/7 Days Discharge Instructions: Apply over primary dressing as directed. Com pression Wrap: CoFlex TLC XL 2-layer Compression System 4x7 (in/yd) 2 x Per Week/7 Days Discharge Instructions: Apply CoFlex 2-layer compression as directed. (alt for 4 layer) WOUND #22: - Lower Leg Wound Laterality: Left, Lateral Cleanser: Soap and Water 2 x Per Week/7 Days Discharge Instructions: May shower and wash wound with dial antibacterial soap and water prior to dressing change. Cleanser: Wound Cleanser 2 x Per Week/7 Days Discharge Instructions: Cleanse the wound with wound cleanser prior to applying a clean dressing using gauze sponges, not tissue or cotton balls. Peri-Wound Care: Triamcinolone 15 (g) 2 x Per Week/7 Days Discharge Instructions: Use triamcinolone 15 (g) as directed Peri-Wound Care: Sween Lotion (Moisturizing lotion) 2 x Per Week/7 Days Discharge Instructions: Apply moisturizing lotion as directed Prim Dressing: VAC 2 x Per Week/7 Days ary Com pression Wrap: CoFlex TLC XL 2-layer Compression System 4x7 (in/yd) 2 x Per Week/7 Days Discharge Instructions: Apply CoFlex 2-layer compression as directed. (alt for 4 layer) WOUND #23: - Upper Leg Wound Laterality: Left, Lateral Secondary Dressing: Zetuvit Plus Silicone Border Dressing 7x7(in/in) 2 x Per Week/ Discharge Instructions: Apply silicone border over primary dressing as directed. 1 continue hfb on the left foot, much improved 2 still vac on the calf drain site 3no obvious  re-accumulation of fluid or tenderness Electronic Signature(s) Signed: 08/10/2021 5:55:17 PM By: Linton Ham MD Entered By: Linton Ham on 08/10/2021 12:49:04 -------------------------------------------------------------------------------- SuperBill Details Patient Name: Date of Service: [name] Garfinkel. 08/10/2021 Medical Record Number: [id number] Patient Account Number: [id number] Date of Birth/Sex: Treating RN: May 08, 1951 (71 y.o. 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verity 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

## 2021-08-10 NOTE — Progress Notes (Signed)
[name], [name] ([id number]) Visit Report for 08/10/2021 Arrival Information Details Patient Name: Date of Service: [name], [name] 08/10/2021 11:00 A M Medical Record Number: [id number] Patient Account Number: [id number] Date of Birth/Sex: Treating RN: 06/13/1951 (71 y.o. [name] Primary Care Rorik Vespa: [name] Other Clinician: Referring Cheng Dec: Treating Jackey Housey/Extender: Stormy Card in Treatment: 34 Visit Information History Since Last Visit Added or deleted any medications: No Patient Arrived: Ambulatory Any new allergies or adverse reactions: No Arrival Time: 11:27 Had a fall or experienced change in No Accompanied By: alone activities of daily living that may affect Transfer Assistance: None risk of falls: Patient Identification Verified: Yes Signs or symptoms of abuse/neglect since last visito No Secondary Verification Process Completed: Yes Hospitalized since last visit: No Patient Requires Transmission-Based Precautions: No Implantable device outside of the clinic excluding No Patient Has Alerts: Yes cellular tissue based products placed in the center Patient Alerts: ABI's: 09/22 L:1.09 R: 1. since last visit: TBI's: R:0.72 L:0.99 Has Dressing in Place as Prescribed: Yes Has Compression in Place as Prescribed: Yes Pain Present Now: No Electronic Signature(s) Signed: 08/10/2021 6:28:13 PM By: Levan Hurst RN, BSN Entered By: Levan Hurst on 08/10/2021 11:28:00 -------------------------------------------------------------------------------- Compression Therapy Details Patient Name: Date of Service: [name]. 08/10/2021 11:00 A M Medical Record Number: [id number] Patient Account Number: [id number] Date of Birth/Sex: Treating RN: August 03, 1950 (71 y.o. [name] Primary Care Naudia Crosley: [name] Other Clinician: Referring Braxtyn Bojarski: Treating Peirce Deveney/Extender: Stormy Card in Treatment:  17 Compression Therapy Performed for Wound Assessment: Wound #22 Left,Lateral Lower Leg Performed By: Clinician Baruch Gouty, RN Compression Type: Double Layer Post Procedure Diagnosis Same as Pre-procedure Electronic Signature(s) Signed: 08/10/2021 5:28:56 PM By: Baruch Gouty RN, BSN Entered By: Baruch Gouty on 08/10/2021 12:19:41 -------------------------------------------------------------------------------- Encounter Discharge Information Details Patient Name: Date of Service: [name]. 08/10/2021 11:00 A M Medical Record Number: [id number] Patient Account Number: [id number] Date of Birth/Sex: Treating RN: 07-May-1951 (71 y.o. [name] Primary Care Khya Halls: [name] Other Clinician: Referring Martell Mcfadyen: Treating Waymond Meador/Extender: Stormy Card in Treatment: 17 Encounter Discharge Information Items Discharge Condition: Stable Ambulatory Status: Ambulatory Discharge Destination: Home Transportation: Private Auto Accompanied By: self Schedule Follow-up Appointment: Yes Clinical Summary of Care: Patient Declined Electronic Signature(s) Signed: 08/10/2021 5:28:56 PM By: Baruch Gouty RN, BSN Entered By: Baruch Gouty on 08/10/2021 12:43:19 -------------------------------------------------------------------------------- Lower Extremity Assessment Details Patient Name: Date of Service: [name]. 08/10/2021 11:00 A M Medical Record Number: [id number] Patient Account Number: [id number] Date of Birth/Sex: Treating RN: 1951/02/12 (71 y.o. [name] Primary Care Selisa Tensley: [name] Other Clinician: Referring Cassidi Modesitt: Treating Keyshaun Exley/Extender: Stormy Card in Treatment: 17 Edema Assessment Assessed: [name]: No] [name]: No] Edema: [Left: Ye] [Right: s] Calf Left: Right: Point of Measurement: 41 cm From Medial Instep 45 cm Ankle Left: Right: Point of Measurement: 10 cm From Medial  Instep 27 cm Vascular Assessment Pulses: Dorsalis Pedis Palpable: [Left:Yes] Electronic Signature(s) Signed: 08/10/2021 6:28:13 PM By: Levan Hurst RN, BSN Entered By: Levan Hurst on 08/10/2021 11:43:44 -------------------------------------------------------------------------------- Multi Wound Chart Details Patient Name: Date of Service: [name]. 08/10/2021 11:00 A M Medical Record Number: [id number] Patient Account Number: [id number] Date of Birth/Sex: Treating RN: 05/03/1951 (71 y.o. [name] Primary Care Pierina Schuknecht: [name] Other Clinician: Referring Kamren Heintzelman: Treating Faige Seely/Extender: Stormy Card in Treatment: 17 Vital Signs Height(in): 74 Capillary Blood Glucose(mg/dl): 133 Weight(lbs): 238 Pulse(bpm): 86  Body Mass Index(BMI): 30.6 Blood Pressure(mmHg): 151/90 Temperature(F): 98.2 Respiratory Rate(breaths/min): 18 Photos: Left, Plantar Metatarsal head first Left, Anterior Lower Leg Left, Lateral Lower Leg Wound Location: Gradually Appeared Gradually Appeared Bump Wounding Event: Diabetic Wound/Ulcer of the Lower Diabetic Wound/Ulcer of the Lower Cyst Primary Etiology: Extremity Extremity Glaucoma, Sleep Apnea, Glaucoma, Sleep Apnea, Glaucoma, Sleep Apnea, Comorbid History: Hypertension, Peripheral Arterial Hypertension, Peripheral Arterial Hypertension, Peripheral Arterial Disease, Peripheral Venous Disease, Disease, Peripheral Venous Disease, Disease, Peripheral Venous Disease, Type II Diabetes, Gout, Osteoarthritis, Type II Diabetes, Gout, Osteoarthritis, Type II Diabetes, Gout, Osteoarthritis, Neuropathy Neuropathy Neuropathy 08/23/2020 08/23/2020 06/03/2021 Date Acquired: 17 17 9  Weeks of Treatment: Open Open Open Wound Status: No No No Wound Recurrence: 1.3x1.7x0.1 0x0x0 2.2x1x0.7 Measurements L x W x D (cm) 1.736 0 1.728 A (cm) : rea 0.174 0 1.21 Volume (cm) : 87.70% 100.00% -4.80% % Reduction  in A rea: 93.80% 100.00% 8.30% % Reduction in Volume: 12 Position 1 (o'clock): 13 Maximum Distance 1 (cm): No No Yes Tunneling: Grade 2 Grade 2 Full Thickness With Exposed Support Classification: Structures Medium None Present Large Exudate Amount: Serosanguineous N/A Sanguinous Exudate Type: red, brown N/A red Exudate Color: Thickened Distinct, outline attached Distinct, outline attached Wound Margin: Large (67-100%) None Present (0%) Large (67-100%) Granulation Amount: Red, Pink N/A Red Granulation Quality: None Present (0%) None Present (0%) None Present (0%) Necrotic Amount: Fat Layer (Subcutaneous Tissue): Yes Fascia: No Fat Layer (Subcutaneous Tissue): Yes Exposed Structures: Fascia: No Fat Layer (Subcutaneous Tissue): No Fascia: No Tendon: No Tendon: No Tendon: No Muscle: No Muscle: No Muscle: No Joint: No Joint: No Joint: No Bone: No Bone: No Bone: No Small (1-33%) Large (67-100%) Small (1-33%) Epithelialization: N/A N/A Compression Therapy Procedures Performed: Negative Pressure Wound Therapy Maintenance (NPWT) Wound Number: 23 N/A N/A Photos: N/A N/A Left, Lateral Upper Leg N/A N/A Wound Location: Laceration N/A N/A Wounding Event: Cyst N/A N/A Primary Etiology: Glaucoma, Sleep Apnea, N/A N/A Comorbid History: Hypertension, Peripheral Arterial Disease, Peripheral Venous Disease, Type II Diabetes, Gout, Osteoarthritis, Neuropathy 06/05/2021 N/A N/A Date Acquired: 3 N/A N/A Weeks of Treatment: Open N/A N/A Wound Status: No N/A N/A Wound Recurrence: 0x0x0 N/A N/A Measurements L x W x D (cm) 0 N/A N/A A (cm) : rea 0 N/A N/A Volume (cm) : 100.00% N/A N/A % Reduction in Area: 100.00% N/A N/A % Reduction in Volume: No N/A N/A Tunneling: Full Thickness Without Exposed N/A N/A Classification: Support Structures None Present N/A N/A Exudate Amount: N/A N/A N/A Exudate Type: N/A N/A N/A Exudate Color: Distinct, outline  attached N/A N/A Wound Margin: None Present (0%) N/A N/A Granulation Amount: N/A N/A N/A Granulation Quality: None Present (0%) N/A N/A Necrotic Amount: Fascia: No N/A N/A Exposed Structures: Fat Layer (Subcutaneous Tissue): No Tendon: No Muscle: No Joint: No Bone: No Large (67-100%) N/A N/A Epithelialization: N/A N/A N/A Procedures Performed: Treatment Notes Electronic Signature(s) Signed: 08/10/2021 5:28:56 PM By: Baruch Gouty RN, BSN Signed: 08/10/2021 5:55:17 PM By: Linton Ham MD Entered By: Linton Ham on 08/10/2021 12:42:47 -------------------------------------------------------------------------------- Negative Pressure Wound Therapy Maintenance (NPWT) Details Patient Name: Date of Service: [name], [name] 08/10/2021 11:00 A M Medical Record Number: [id number] Patient Account Number: [id number] Date of Birth/Sex: Treating RN: [birth year] (71 y.o. [name] Primary Care Pepper Kerrick: [name] Other Clinician: Referring Emonnie Cannady: Treating Mccormick Macon/Extender: Stormy Card in Treatment: 17 NPWT Maintenance Performed for: Wound #22 Left, Lateral Lower Leg Additional Injuries Covered: No Performed By: Baruch Gouty, RN Type: [place]  Coverage Size (sq cm): 2.2 Pressure Type: Constant Pressure Setting: 125 mmHG Drain Type: None Sponge/Dressing Type: Combination : gauze and black foam Date Initiated: 07/27/2021 Dressing Removed: No Quantity of Sponges/Gauze Removed: 2 Canister Changed: No Canister Exudate Volume: 150 Dressing Reapplied: No Quantity of Sponges/Gauze Inserted: 1 gauze, 1 piece black foam Respones T Treatment: o good Days On NPWT : 15 Post Procedure Diagnosis Same as Pre-procedure Electronic Signature(s) Signed: 08/10/2021 5:28:56 PM By: Baruch Gouty RN, BSN Entered By: Baruch Gouty on 08/10/2021 12:19:02 -------------------------------------------------------------------------------- Pain  Assessment Details Patient Name: Date of Service: [name]. 08/10/2021 11:00 A M Medical Record Number: [id number] Patient Account Number: [id number] Date of Birth/Sex: Treating RN: July 22, 1950 (71 y.o. [name] Primary Care Dontarius Sheley: [name] Other Clinician: Referring Sheryl Towell: Treating Bessie Livingood/Extender: Stormy Card in Treatment: 17 Active Problems Location of Pain Severity and Description of Pain Patient Has Paino No Site Locations Pain Management and Medication Current Pain Management: Electronic Signature(s) Signed: 08/10/2021 6:28:13 PM By: Levan Hurst RN, BSN Entered By: Levan Hurst on 08/10/2021 11:31:12 -------------------------------------------------------------------------------- Wound Assessment Details Patient Name: Date of Service: [name]. 08/10/2021 11:00 A M Medical Record Number: [id number] Patient Account Number: [id number] Date of Birth/Sex: Treating RN: 1950/07/19 (71 y.o. [name] Primary Care Graden Hoshino: [name] Other Clinician: Referring Emaline Karnes: Treating Dotty Gonzalo/Extender: Stormy Card in Treatment: 17 Wound Status Wound Number: 18 Primary Diabetic Wound/Ulcer of the Lower Extremity Etiology: Wound Location: Left, Plantar Metatarsal head first Wound Open Wounding Event: Gradually Appeared Status: Date Acquired: 08/23/2020 Comorbid Glaucoma, Sleep Apnea, Hypertension, Peripheral Arterial Disease, Weeks Of Treatment: 17 History: Peripheral Venous Disease, Type II Diabetes, Gout, Osteoarthritis, Clustered Wound: No Neuropathy Photos Wound Measurements Length: (cm) 1.3 Width: (cm) 1.7 Depth: (cm) 0.1 Area: (cm) 1.736 Volume: (cm) 0.174 % Reduction in Area: 87.7% % Reduction in Volume: 93.8% Epithelialization: Small (1-33%) Tunneling: No Undermining: No Wound Description Classification: Grade 2 Wound Margin: Thickened Exudate Amount:  Medium Exudate Type: Serosanguineous Exudate Color: red, brown Foul Odor After Cleansing: No Slough/Fibrino No Wound Bed Granulation Amount: Large (67-100%) Exposed Structure Granulation Quality: Red, Pink Fascia Exposed: No Necrotic Amount: None Present (0%) Fat Layer (Subcutaneous Tissue) Exposed: Yes Tendon Exposed: No Muscle Exposed: No Joint Exposed: No Bone Exposed: No Treatment Notes Wound #18 (Metatarsal head first) Wound Laterality: Plantar, Left Cleanser Soap and Water Discharge Instruction: May shower and wash wound with dial antibacterial soap and water prior to dressing change. Wound Cleanser Discharge Instruction: Cleanse the wound with wound cleanser prior to applying a clean dressing using gauze sponges, not tissue or cotton balls. Peri-Wound Care Triamcinolone 15 (g) Discharge Instruction: Use triamcinolone 15 (g) as directed Sween Lotion (Moisturizing lotion) Discharge Instruction: Apply moisturizing lotion as directed Topical Primary Dressing Hydrofera Blue Ready Foam, 2.5 x2.5 in Discharge Instruction: Apply to wound bed as instructed Secondary Dressing ABD Pad, 5x9 Discharge Instruction: Apply over primary dressing as directed. Secured With Compression Wrap CoFlex TLC XL 2-layer Compression System 4x7 (in/yd) Discharge Instruction: Apply CoFlex 2-layer compression as directed. (alt for 4 layer) Compression Stockings Add-Ons Electronic Signature(s) Signed: 08/10/2021 6:28:13 PM By: Levan Hurst RN, BSN Entered By: Levan Hurst on 08/10/2021 11:47:51 -------------------------------------------------------------------------------- Wound Assessment Details Patient Name: Date of Service: [name]. 08/10/2021 11:00 A M Medical Record Number: [id number] Patient Account Number: [id number] Date of Birth/Sex: Treating RN: 01-Jan-1951 (71 y.o. [name] Primary Care Latonda Larrivee: [name] Other Clinician: Referring Marton Malizia: Treating  Hiawatha Merriott/Extender: Linton Ham  [name] Weeks in Treatment: 17 Wound Status Wound Number: 19 Primary Diabetic Wound/Ulcer of the Lower Extremity Etiology: Wound Location: Left, Anterior Lower Leg Wound Open Wounding Event: Gradually Appeared Status: Date Acquired: 08/23/2020 Comorbid Glaucoma, Sleep Apnea, Hypertension, Peripheral Arterial Disease, Weeks Of Treatment: 17 History: Peripheral Venous Disease, Type II Diabetes, Gout, Osteoarthritis, Clustered Wound: No Neuropathy Photos Wound Measurements Length: (cm) Width: (cm) Depth: (cm) Area: (cm) Volume: (cm) 0 % Reduction in Area: 100% 0 % Reduction in Volume: 100% 0 Epithelialization: Large (67-100%) 0 Tunneling: No 0 Undermining: No Wound Description Classification: Grade 2 Wound Margin: Distinct, outline attached Exudate Amount: None Present Foul Odor After Cleansing: No Slough/Fibrino No Wound Bed Granulation Amount: None Present (0%) Exposed Structure Necrotic Amount: None Present (0%) Fascia Exposed: No Fat Layer (Subcutaneous Tissue) Exposed: No Tendon Exposed: No Muscle Exposed: No Joint Exposed: No Bone Exposed: No Electronic Signature(s) Signed: 08/10/2021 6:28:13 PM By: Levan Hurst RN, BSN Entered By: Levan Hurst on 08/10/2021 11:48:28 -------------------------------------------------------------------------------- Wound Assessment Details Patient Name: Date of Service: [name]. 08/10/2021 11:00 A M Medical Record Number: [id number] Patient Account Number: [id number] Date of Birth/Sex: Treating RN: [birth year] (71 y.o. [name] Primary Care Shykeria Sakamoto: [name] Other Clinician: Referring Johanna Matto: Treating Tomothy Eddins/Extender: Stormy Card in Treatment: 17 Wound Status Wound Number: 22 Primary Cyst Etiology: Wound Location: Left, Lateral Lower Leg Wound Open Wounding Event: Bump Status: Date Acquired: 06/03/2021 Comorbid Glaucoma, Sleep  Apnea, Hypertension, Peripheral Arterial Disease, Weeks Of Treatment: 9 History: Peripheral Venous Disease, Type II Diabetes, Gout, Osteoarthritis, Clustered Wound: No Neuropathy Photos Wound Measurements Length: (cm) 2.2 Width: (cm) 1 Depth: (cm) 0.7 Area: (cm) 1.728 Volume: (cm) 1.21 % Reduction in Area: -4.8% % Reduction in Volume: 8.3% Epithelialization: Small (1-33%) Tunneling: Yes Position (o'clock): 12 Maximum Distance: (cm) 13 Undermining: No Wound Description Classification: Full Thickness With Exposed Support Structures Wound Margin: Distinct, outline attached Exudate Amount: Large Exudate Type: Sanguinous Exudate Color: red Foul Odor After Cleansing: No Slough/Fibrino No Wound Bed Granulation Amount: Large (67-100%) Exposed Structure Granulation Quality: Red Fascia Exposed: No Necrotic Amount: None Present (0%) Fat Layer (Subcutaneous Tissue) Exposed: Yes Tendon Exposed: No Muscle Exposed: No Joint Exposed: No Bone Exposed: No Treatment Notes Wound #22 (Lower Leg) Wound Laterality: Left, Lateral Cleanser Soap and Water Discharge Instruction: May shower and wash wound with dial antibacterial soap and water prior to dressing change. Wound Cleanser Discharge Instruction: Cleanse the wound with wound cleanser prior to applying a clean dressing using gauze sponges, not tissue or cotton balls. Peri-Wound Care Triamcinolone 15 (g) Discharge Instruction: Use triamcinolone 15 (g) as directed Sween Lotion (Moisturizing lotion) Discharge Instruction: Apply moisturizing lotion as directed Topical Primary Dressing VAC Secondary Dressing Secured With Compression Wrap CoFlex TLC XL 2-layer Compression System 4x7 (in/yd) Discharge Instruction: Apply CoFlex 2-layer compression as directed. (alt for 4 layer) Compression Stockings Add-Ons Electronic Signature(s) Signed: 08/10/2021 6:28:13 PM By: Levan Hurst RN, BSN Entered By: Levan Hurst on 08/10/2021  11:49:13 -------------------------------------------------------------------------------- Wound Assessment Details Patient Name: Date of Service: [name]. 08/10/2021 11:00 A M Medical Record Number: [id number] Patient Account Number: [id number] Date of Birth/Sex: Treating RN: 1950/07/02 (71 y.o. [name] Primary Care Kycen Spalla: [name] Other Clinician: Referring Ryelynn Guedea: Treating Hussain Maimone/Extender: Stormy Card in Treatment: 17 Wound Status Wound Number: 23 Primary Cyst Etiology: Wound Location: Left, Lateral Upper Leg Wound Open Wounding Event: Laceration Status: Date Acquired: 06/05/2021 Comorbid Glaucoma, Sleep Apnea, Hypertension, Peripheral Arterial Disease, Weeks  Of Treatment: 3 History: Peripheral Venous Disease, Type II Diabetes, Gout, Osteoarthritis, Clustered Wound: No Neuropathy Photos Wound Measurements Length: (cm) Width: (cm) Depth: (cm) Area: (cm) Volume: (cm) 0 % Reduction in Area: 100% 0 % Reduction in Volume: 100% 0 Epithelialization: Large (67-100%) 0 Tunneling: No 0 Undermining: No Wound Description Classification: Full Thickness Without Exposed Support Structures Wound Margin: Distinct, outline attached Exudate Amount: None Present Foul Odor After Cleansing: No Slough/Fibrino No Wound Bed Granulation Amount: None Present (0%) Exposed Structure Necrotic Amount: None Present (0%) Fascia Exposed: No Fat Layer (Subcutaneous Tissue) Exposed: No Tendon Exposed: No Muscle Exposed: No Joint Exposed: No Bone Exposed: No Electronic Signature(s) Signed: 08/10/2021 6:28:13 PM By: Levan Hurst RN, BSN Entered By: Levan Hurst on 08/10/2021 11:49:53 -------------------------------------------------------------------------------- [place] Details Patient Name: Date of Service: [name]. 08/10/2021 11:00 A M Medical Record Number: [id number] Patient Account Number: [id number] Date of Birth/Sex:  Treating RN: 1951/04/11 (71 y.o. [name] Primary Care Anyelin Mogle: [name] Other Clinician: Referring Mohamud Mrozek: Treating Russ Looper/Extender: Stormy Card in Treatment: 17 Vital Signs Time Taken: 11:28 Temperature (F): 98.2 Height (in): 74 Pulse (bpm): 67 Weight (lbs): 238 Respiratory Rate (breaths/min): 18 Body Mass Index (BMI): 30.6 Blood Pressure (mmHg): 151/90 Capillary Blood Glucose (mg/dl): 133 Reference Range: 80 - 120 mg / dl Notes glucose per pt report Electronic Signature(s) Signed: 08/10/2021 6:28:13 PM By: Levan Hurst RN, BSN Entered By: Levan Hurst on 08/10/2021 11:30:24

## 2021-08-14 ENCOUNTER — Encounter (HOSPITAL_BASED_OUTPATIENT_CLINIC_OR_DEPARTMENT_OTHER): Payer: Medicare Other | Admitting: Internal Medicine

## 2021-08-14 ENCOUNTER — Other Ambulatory Visit: Payer: Self-pay

## 2021-08-14 DIAGNOSIS — E11621 Type 2 diabetes mellitus with foot ulcer: Secondary | ICD-10-CM | POA: Diagnosis not present

## 2021-08-14 NOTE — Progress Notes (Signed)
GILMER, KAMINSKY ([id number]) Visit Report for 08/14/2021 [facility] Details Patient Name: Date of Service: [name], [name] 08/14/2021 Medical Record Number: [id number] Patient Account Number: [id number] Date of Birth/Sex: Treating RN: 10-Jan-1951 (71 y.o. 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

## 2021-08-14 NOTE — Progress Notes (Signed)
KALVIN, BUSS ([id number]) Visit Report for 08/14/2021 Arrival Information Details Patient Name: Date of Service: TIMARION, AGCAOILI 08/14/2021 9:15 A M Medical Record Number: [id number] Patient Account Number: [id number] Date of Birth/Sex: Treating RN: [birth year] (71 y.o. [name], [name] Primary Care Rossie Scarfone: [name] Other Clinician: Referring Jadin Creque: Treating Aislin Onofre/Extender: Stormy Card in Treatment: 32 Visit Information History Since Last Visit Added or deleted any medications: No Patient Arrived: Ambulatory Any new allergies or adverse reactions: No Arrival Time: 09:24 Had a fall or experienced change in No Accompanied By: self activities of daily living that may affect Transfer Assistance: None risk of falls: Patient Identification Verified: Yes Signs or symptoms of abuse/neglect since last visito No Secondary Verification Process Completed: Yes Hospitalized since last visit: No Patient Requires Transmission-Based Precautions: No Implantable device outside of the clinic excluding No Patient Has Alerts: Yes cellular tissue based products placed in the center Patient Alerts: ABI's: 09/22 L:1.09 R: 1. since last visit: TBI's: R:0.72 L:0.99 Has Dressing in Place as Prescribed: Yes Has Compression in Place as Prescribed: Yes Pain Present Now: No Electronic Signature(s) Signed: 08/14/2021 5:43:45 PM By: Baruch Gouty RN, BSN Entered By: Baruch Gouty on 08/14/2021 09:26:18 -------------------------------------------------------------------------------- Compression Therapy Details Patient Name: Date of Service: [name] 08/14/2021 9:15 A M Medical Record Number: [id number] Patient Account Number: [id number] Date of Birth/Sex: Treating RN: 06-Jul-1950 (71 y.o. [name] Primary Care Lyzbeth Genrich: [name] Other Clinician: Referring Kelby Lotspeich: Treating Thomasenia Dowse/Extender: Stormy Card in Treatment:  17 Compression Therapy Performed for Wound Assessment: Wound #22 Left,Lateral Lower Leg Performed By: Clinician Baruch Gouty, RN Compression Type: Double Layer Electronic Signature(s) Signed: 08/14/2021 5:43:45 PM By: Baruch Gouty RN, BSN Entered By: Baruch Gouty on 08/14/2021 10:00:19 -------------------------------------------------------------------------------- Encounter Discharge Information Details Patient Name: Date of Service: [name] 08/14/2021 9:15 A M Medical Record Number: [id number] Patient Account Number: [id number] Date of Birth/Sex: Treating RN: [birth year] (71 y.o. [name] Primary Care Morelia Cassells: [name] Other Clinician: Referring Coyt Govoni: Treating Taichi Repka/Extender: Stormy Card in Treatment: 17 Encounter Discharge Information Items Discharge Condition: Stable Ambulatory Status: Ambulatory Discharge Destination: Home Transportation: Private Auto Accompanied By: self Schedule Follow-up Appointment: Yes Clinical Summary of Care: Patient Declined Electronic Signature(s) Signed: 08/14/2021 5:43:45 PM By: Baruch Gouty RN, BSN Entered By: Baruch Gouty on 08/14/2021 10:02:25 -------------------------------------------------------------------------------- Negative Pressure Wound Therapy Maintenance (NPWT) Details Patient Name: Date of Service: VEGAS, FRITZE 08/14/2021 9:15 A M Medical Record Number: [id number] Patient Account Number: [id number] Date of Birth/Sex: Treating RN: 1951-03-12 (71 y.o. [name] Primary Care Harrol Novello: [name] Other Clinician: Referring Cyan Moultrie: Treating Lasya Vetter/Extender: Stormy Card in Treatment: 17 NPWT Maintenance Performed for: Wound #22 Left, Lateral Lower Leg Additional Injuries Covered: No Performed By: Baruch Gouty, RN Type: VAC System Coverage Size (sq cm): 2.2 Pressure Type: Constant Pressure Setting: 125 mmHG Drain Type:  None Sponge/Dressing Type: Combination : gauze and black sponge Date Initiated: 07/27/2021 Dressing Removed: No Quantity of Sponges/Gauze Removed: 2 Canister Changed: No Canister Exudate Volume: 150 Dressing Reapplied: No Quantity of Sponges/Gauze Inserted: 1 gauze and 1 black sponge Respones T Treatment: o good Days On NPWT : 19 Electronic Signature(s) Signed: 08/14/2021 5:43:45 PM By: Baruch Gouty RN, BSN Entered By: Baruch Gouty on 08/14/2021 10:01:20 -------------------------------------------------------------------------------- Patient/Caregiver Education Details Patient Name: Date of Service: [name] 2/20/2023andnbsp9:15 A M Medical Record Number: [id number] Patient Account Number: [id number] Date of Birth/Gender: Treating RN: 07/10/1950 (  71 y.o. [name] Primary Care Physician: [name] Other Clinician: Referring Physician: Treating Physician/Extender: Stormy Card in Treatment: 17 Education Assessment Education Provided To: Patient Education Topics Provided Offloading: Methods: Explain/Verbal Responses: Reinforcements needed, State content correctly Venous: Methods: Explain/Verbal Responses: Reinforcements needed, State content correctly Wound/Skin Impairment: Methods: Explain/Verbal Responses: Reinforcements needed, State content correctly Electronic Signature(s) Signed: 08/14/2021 5:43:45 PM By: Baruch Gouty RN, BSN Entered By: Baruch Gouty on 08/14/2021 10:02:09 -------------------------------------------------------------------------------- Wound Assessment Details Patient Name: Date of Service: [name] 08/14/2021 9:15 A M Medical Record Number: [id number] Patient Account Number: [id number] Date of Birth/Sex: Treating RN: 03/04/1951 (71 y.o. [name] Primary Care Goldie Dimmer: [name] Other Clinician: Referring Maze Corniel: Treating Khamiya Varin/Extender: Stormy Card in Treatment: 17 Wound Status Wound Number: 18 Primary Etiology: Diabetic Wound/Ulcer of the Lower Extremity Wound Location: Left, Plantar Metatarsal head first Wound Status: Open Wounding Event: Gradually Appeared Date Acquired: 08/23/2020 Weeks Of Treatment: 17 Clustered Wound: No Wound Measurements Length: (cm) 1.3 Width: (cm) 1.7 Depth: (cm) 0.1 Area: (cm) 1.736 Volume: (cm) 0.174 % Reduction in Area: 87.7% % Reduction in Volume: 93.8% Wound Description Classification: Grade 2 Exudate Amount: Medium Exudate Type: Serosanguineous Exudate Color: red, brown Treatment Notes Wound #18 (Metatarsal head first) Wound Laterality: Plantar, Left Cleanser Soap and Water Discharge Instruction: May shower and wash wound with dial antibacterial soap and water prior to dressing change. Wound Cleanser Discharge Instruction: Cleanse the wound with wound cleanser prior to applying a clean dressing using gauze sponges, not tissue or cotton balls. Peri-Wound Care Triamcinolone 15 (g) Discharge Instruction: Use triamcinolone 15 (g) as directed Sween Lotion (Moisturizing lotion) Discharge Instruction: Apply moisturizing lotion as directed Topical Primary Dressing Hydrofera Blue Ready Foam, 2.5 x2.5 in Discharge Instruction: Apply to wound bed as instructed Secondary Dressing ABD Pad, 5x9 Discharge Instruction: Apply over primary dressing as directed. Secured With Compression Wrap CoFlex TLC XL 2-layer Compression System 4x7 (in/yd) Discharge Instruction: Apply CoFlex 2-layer compression as directed. (alt for 4 layer) Compression Stockings Add-Ons Electronic Signature(s) Signed: 08/14/2021 5:43:45 PM By: Baruch Gouty RN, BSN Entered By: Baruch Gouty on 08/14/2021 09:59:53 -------------------------------------------------------------------------------- Wound Assessment Details Patient Name: Date of Service: [name] 08/14/2021 9:15 A M Medical Record Number:  [id number] Patient Account Number: [id number] Date of Birth/Sex: Treating RN: 1950/11/18 (71 y.o. [name] Primary Care Reegan Bouffard: [name] Other Clinician: Referring Ravon Mortellaro: Treating Charita Lindenberger/Extender: Stormy Card in Treatment: 17 Wound Status Wound Number: 22 Primary Etiology: Cyst Wound Location: Left, Lateral Lower Leg Wound Status: Open Wounding Event: Bump Date Acquired: 06/03/2021 Weeks Of Treatment: 10 Clustered Wound: No Wound Measurements Length: (cm) 2.2 Width: (cm) 1 Depth: (cm) 0.7 Area: (cm) 1.728 Volume: (cm) 1.21 % Reduction in Area: -4.8% % Reduction in Volume: 8.3% Wound Description Classification: Full Thickness With Exposed Support Structure Exudate Amount: Large Exudate Type: Sanguinous Exudate Color: red s Treatment Notes Wound #22 (Lower Leg) Wound Laterality: Left, Lateral Cleanser Soap and Water Discharge Instruction: May shower and wash wound with dial antibacterial soap and water prior to dressing change. Wound Cleanser Discharge Instruction: Cleanse the wound with wound cleanser prior to applying a clean dressing using gauze sponges, not tissue or cotton balls. Peri-Wound Care Triamcinolone 15 (g) Discharge Instruction: Use triamcinolone 15 (g) as directed Sween Lotion (Moisturizing lotion) Discharge Instruction: Apply moisturizing lotion as directed Topical Primary Dressing VAC Secondary Dressing Secured With Compression Wrap CoFlex TLC XL 2-layer Compression System 4x7 (in/yd) Discharge  Instruction: Apply CoFlex 2-layer compression as directed. (alt for 4 layer) Compression Stockings Add-Ons Electronic Signature(s) Signed: 08/14/2021 5:43:45 PM By: Baruch Gouty RN, BSN Entered By: Baruch Gouty on 08/14/2021 09:59:53 -------------------------------------------------------------------------------- Vitals Details Patient Name: Date of Service: [name] 08/14/2021 9:15 A  M Medical Record Number: [id number] Patient Account Number: [id number] Date of Birth/Sex: Treating RN: [birth year] (71 y.o. [name] Primary Care Keela Rubert: [name] Other Clinician: Referring Amarea Macdowell: Treating Kaniya Trueheart/Extender: Stormy Card in Treatment: 17 Vital Signs Time Taken: 09:27 Temperature (F): 98.3 Height (in): 74 Pulse (bpm): 78 Weight (lbs): 238 Respiratory Rate (breaths/min): 20 Body Mass Index (BMI): 30.6 Blood Pressure (mmHg): 144/78 Capillary Blood Glucose (mg/dl): 227 Reference Range: 80 - 120 mg / dl Notes glucose per pt's meter at present Electronic Signature(s) Signed: 08/14/2021 5:43:45 PM By: Baruch Gouty RN, BSN Entered By: Baruch Gouty on 08/14/2021 09:28:53

## 2021-08-17 ENCOUNTER — Encounter (HOSPITAL_BASED_OUTPATIENT_CLINIC_OR_DEPARTMENT_OTHER): Payer: Medicare Other | Admitting: Internal Medicine

## 2021-08-17 ENCOUNTER — Other Ambulatory Visit: Payer: Self-pay

## 2021-08-17 DIAGNOSIS — E11621 Type 2 diabetes mellitus with foot ulcer: Secondary | ICD-10-CM | POA: Diagnosis not present

## 2021-08-17 NOTE — Progress Notes (Signed)
CARZELL, SALDIVAR ([id number]) Visit Report for 08/17/2021 Arrival Information Details Patient Name: Date of Service: [name], [name] 08/17/2021 1:45 PM Medical Record Number: [id number] Patient Account Number: [id number] Date of Birth/Sex: Treating RN: 09-Dec-1950 (71 y.o. [name] Primary Care Ahmiya Abee: [name] Other Clinician: Referring Janesia Joswick: Treating Yamili Lichtenwalner/Extender: Stormy Card in Treatment: 18 Visit Information History Since Last Visit Added or deleted any medications: No Patient Arrived: Ambulatory Any new allergies or adverse reactions: No Arrival Time: 13:52 Had a fall or experienced change in No Accompanied By: self activities of daily living that may affect Transfer Assistance: None risk of falls: Patient Identification Verified: Yes Signs or symptoms of abuse/neglect since last visito No Patient Requires Transmission-Based Precautions: No Hospitalized since last visit: No Patient Has Alerts: Yes Implantable device outside of the clinic excluding No Patient Alerts: ABI's: 09/22 L:1.09 R: 1. cellular tissue based products placed in the center TBI's: R:0.72 L:0.99 since last visit: Has Dressing in Place as Prescribed: Yes Pain Present Now: No Electronic Signature(s) Signed: 08/17/2021 5:54:00 PM By: Dellie Catholic RN Entered By: Dellie Catholic on 08/17/2021 13:54:32 -------------------------------------------------------------------------------- Compression Therapy Details Patient Name: Date of Service: [name] Garfinkel. 08/17/2021 1:45 PM Medical Record Number: [id number] Patient Account Number: [id number] Date of Birth/Sex: Treating RN: 15-Feb-1951 (71 y.o. [name] Primary Care Luismiguel Lamere: [name] Other Clinician: Referring Ronnette Rump: Treating Jaely Silman/Extender: Stormy Card in Treatment: 18 Compression Therapy Performed for Wound Assessment: Wound #22 Left,Lateral Lower Leg Performed  By: Clinician Dellie Catholic, RN Compression Type: Four Layer Post Procedure Diagnosis Same as Pre-procedure Electronic Signature(s) Signed: 08/17/2021 5:54:00 PM By: Dellie Catholic RN Entered By: Dellie Catholic on 08/17/2021 17:50:03 -------------------------------------------------------------------------------- Encounter Discharge Information Details Patient Name: Date of Service: [name] Garfinkel. 08/17/2021 1:45 PM Medical Record Number: [id number] Patient Account Number: [id number] Date of Birth/Sex: Treating RN: [birth year] (71 y.o. [name] Primary Care Benard Minturn: [name] Other Clinician: Referring Adem Costlow: Treating Lakasha Mcfall/Extender: Stormy Card in Treatment: 18 Encounter Discharge Information Items Discharge Condition: Stable Ambulatory Status: Ambulatory Discharge Destination: Home Transportation: Private Auto Accompanied By: self Schedule Follow-up Appointment: Yes Clinical Summary of Care: Patient Declined Electronic Signature(s) Signed: 08/17/2021 5:54:00 PM By: Dellie Catholic RN Entered By: Dellie Catholic on 08/17/2021 17:53:28 -------------------------------------------------------------------------------- Lower Extremity Assessment Details Patient Name: Date of Service: [name], [name] 08/17/2021 1:45 PM Medical Record Number: [id number] Patient Account Number: [id number] Date of Birth/Sex: Treating RN: [birth year] (71 y.o. [name] Primary Care Elyshia Kumagai: [name] Other Clinician: Referring Martiza Speth: Treating Virga Haltiwanger/Extender: Stormy Card in Treatment: 18 Edema Assessment Assessed: [name]: No] [name]: No] Edema: [Left: Ye] [Right: s] Calf Left: Right: Point of Measurement: 41 cm From Medial Instep 46 cm Ankle Left: Right: Point of Measurement: 10 cm From Medial Instep 26.5 cm Electronic Signature(s) Signed: 08/17/2021 5:54:00 PM By: Dellie Catholic RN Entered By: Dellie Catholic on 08/17/2021 14:22:47 -------------------------------------------------------------------------------- Multi Wound Chart Details Patient Name: Date of Service: [name] Garfinkel. 08/17/2021 1:45 PM Medical Record Number: [id number] Patient Account Number: [id number] Date of Birth/Sex: Treating RN: 06-27-1950 (71 y.o. [name] Primary Care Breyson Kelm: [name] Other Clinician: Referring Brooklynn Brandenburg: Treating Leilanee Righetti/Extender: Stormy Card in Treatment: 18 Vital Signs Height(in): 74 Pulse(bpm): 44 Weight(lbs): 40 Blood Pressure(mmHg): 134/73 Body Mass Index(BMI): 30.6 Temperature(F): 98.6 Respiratory Rate(breaths/min): 20 Photos: [N/A:N/A] Left, Plantar Metatarsal head first Left, Lateral Lower Leg N/A Wound Location: Gradually Appeared Bump N/A Wounding Event:  Diabetic Wound/Ulcer of the Lower Cyst N/A Primary Etiology: Extremity Glaucoma, Sleep Apnea, Glaucoma, Sleep Apnea, N/A Comorbid History: Hypertension, Peripheral Arterial Hypertension, Peripheral Arterial Disease, Peripheral Venous Disease, Disease, Peripheral Venous Disease, Type II Diabetes, Gout, Osteoarthritis, Type II Diabetes, Gout, Osteoarthritis, Neuropathy Neuropathy 08/23/2020 06/03/2021 N/A Date Acquired: 18 10 N/A Weeks of Treatment: Open Open N/A Wound Status: No No N/A Wound Recurrence: 1.2x1.5x0.1 2x1x0.8 N/A Measurements L x W x D (cm) 1.414 1.571 N/A A (cm) : rea 0.141 1.257 N/A Volume (cm) : 90.00% 4.70% N/A % Reduction in A rea: 95.00% 4.70% N/A % Reduction in Volume: 12 Position 1 (o'clock): 15 Maximum Distance 1 (cm): No Yes N/A Tunneling: Grade 2 Full Thickness With Exposed Support N/A Classification: Structures Medium Large N/A Exudate A mount: Serosanguineous Sanguinous N/A Exudate Type: red, brown red N/A Exudate Color: Distinct, outline attached N/A N/A Wound Margin: Large (67-100%) Large (67-100%) N/A Granulation  Amount: Red Red, Friable N/A Granulation Quality: Small (1-33%) None Present (0%) N/A Necrotic Amount: Eschar, Adherent Slough N/A N/A Necrotic Tissue: Fat Layer (Subcutaneous Tissue): Yes Fat Layer (Subcutaneous Tissue): Yes N/A Exposed Structures: Fascia: No Fascia: No Tendon: No Tendon: No Muscle: No Muscle: No Joint: No Joint: No Bone: No Bone: No Small (1-33%) Small (1-33%) N/A Epithelialization: Treatment Notes Electronic Signature(s) Signed: 08/17/2021 5:03:55 PM By: Linton Ham MD Signed: 08/17/2021 5:54:00 PM By: Dellie Catholic RN Entered By: Linton Ham on 08/17/2021 14:35:24 -------------------------------------------------------------------------------- Multi-Disciplinary Care Plan Details Patient Name: Date of Service: [name] Garfinkel. 08/17/2021 1:45 PM Medical Record Number: [id number] Patient Account Number: [id number] Date of Birth/Sex: Treating RN: 12-08-1950 (71 y.o. [name] Primary Care Aizlyn Schifano: [name] Other Clinician: Referring Jonelle Bann: Treating Hermen Mario/Extender: Stormy Card in Treatment: 18 Multidisciplinary Care Plan reviewed with physician Active Inactive Soft Tissue Infection Nursing Diagnoses: Impaired tissue integrity Potential for infection: soft tissue Goals: Patient's soft tissue infection will resolve Date Initiated: 04/20/2021 Target Resolution Date: 08/24/2021 Goal Status: Active Interventions: Assess signs and symptoms of infection every visit Provide education on infection Treatment Activities: Culture : 04/20/2021 Education provided on Infection : 08/07/2021 Systemic antibiotics : 04/20/2021 T ordered outside of clinic : 04/20/2021 est Notes: Venous Leg Ulcer Nursing Diagnoses: Knowledge deficit related to disease process and management Potential for venous Insuffiency (use before diagnosis confirmed) Goals: Patient will maintain optimal edema control Date Initiated:  07/27/2021 Target Resolution Date: 08/24/2021 Goal Status: Active Interventions: Assess peripheral edema status every visit. Treatment Activities: Therapeutic compression applied : 07/27/2021 Notes: Wound/Skin Impairment Nursing Diagnoses: Impaired tissue integrity Knowledge deficit related to ulceration/compromised skin integrity Goals: Patient will have a decrease in wound volume by X% from date: (specify in notes) Date Initiated: 04/12/2021 Date Inactivated: 04/27/2021 Target Resolution Date: 04/23/2021 Goal Status: Met Patient/caregiver will verbalize understanding of skin care regimen Date Initiated: 04/12/2021 Target Resolution Date: 08/24/2021 Goal Status: Active Ulcer/skin breakdown will have a volume reduction of 30% by week 4 Date Initiated: 04/12/2021 Date Inactivated: 04/27/2021 Target Resolution Date: 04/27/2021 Goal Status: Unmet Unmet Reason: infection Ulcer/skin breakdown will have a volume reduction of 50% by week 8 Date Initiated: 04/27/2021 Date Inactivated: 06/29/2021 Target Resolution Date: 06/24/2021 Goal Status: Met Interventions: Assess patient/caregiver ability to obtain necessary supplies Assess patient/caregiver ability to perform ulcer/skin care regimen upon admission and as needed Assess ulceration(s) every visit Notes: Electronic Signature(s) Signed: 08/17/2021 5:54:00 PM By: Dellie Catholic RN Entered By: Dellie Catholic on 08/17/2021 17:44:33 -------------------------------------------------------------------------------- Negative Pressure Wound Therapy Maintenance (NPWT) Details Patient Name: Date of Service:  [name], [name] 08/17/2021 1:45 PM Medical Record Number: [id number] Patient Account Number: [id number] Date of Birth/Sex: Treating RN: 1950/08/08 (71 y.o. [name] Primary Care Zamir Staples: [name] Other Clinician: Referring Calle Schader: Treating Sanaya Gwilliam/Extender: Stormy Card in Treatment: 18 NPWT  Maintenance Performed for: Wound #22 Left, Lateral Lower Leg Additional Injuries Covered: No Performed By: Dellie Catholic, RN Type: VAC System Coverage Size (sq cm): 2 Pressure Type: Constant Pressure Setting: 125 mmHG Drain Type: None Primary Contact: Non-Adherent Sponge/Dressing Type: Combination : Gauze and black Date Initiated: 07/27/2021 Dressing Removed: No Quantity of Sponges/Gauze Removed: 2 Canister Changed: Yes Dressing Reapplied: Yes Quantity of Sponges/Gauze Inserted: 1 black;1 gauze Respones T Treatment: o tolerated well Days On NPWT : 22 Post Procedure Diagnosis Same as Pre-procedure Electronic Signature(s) Signed: 08/17/2021 5:54:00 PM By: Dellie Catholic RN Entered By: Dellie Catholic on 08/17/2021 17:49:30 -------------------------------------------------------------------------------- Pain Assessment Details Patient Name: Date of Service: [name], [name] 08/17/2021 1:45 PM Medical Record Number: [id number] Patient Account Number: [id number] Date of Birth/Sex: Treating RN: 08/14/1950 (71 y.o. [name] Primary Care Arsen Mangione: [name] Other Clinician: Referring Tessia Kassin: Treating Pennelope Basque/Extender: Stormy Card in Treatment: 18 Active Problems Location of Pain Severity and Description of Pain Patient Has Paino No Site Locations Pain Management and Medication Current Pain Management: Electronic Signature(s) Signed: 08/17/2021 5:54:00 PM By: Dellie Catholic RN Entered By: Dellie Catholic on 08/17/2021 14:02:24 -------------------------------------------------------------------------------- Patient/Caregiver Education Details Patient Name: Date of Service: [name] Garfinkel 2/23/2023andnbsp1:45 PM Medical Record Number: [id number] Patient Account Number: [id number] Date of Birth/Gender: Treating RN: 06-17-1951 (71 y.o. [name] Primary Care Physician: [name] Other Clinician: Referring  Physician: Treating Physician/Extender: Stormy Card in Treatment: 18 Education Assessment Education Provided To: Patient Education Topics Provided Infection: Methods: Explain/Verbal Responses: Return demonstration correctly Electronic Signature(s) Signed: 08/17/2021 5:54:00 PM By: Dellie Catholic RN Entered By: Dellie Catholic on 08/17/2021 17:45:04 -------------------------------------------------------------------------------- Wound Assessment Details Patient Name: Date of Service: [name], [name] 08/17/2021 1:45 PM Medical Record Number: [id number] Patient Account Number: [id number] Date of Birth/Sex: Treating RN: 15-Oct-1950 (71 y.o. [name] Primary Care Zamiya Dillard: [name] Other Clinician: Referring Dustyn Armbrister: Treating Destan Franchini/Extender: Stormy Card in Treatment: 18 Wound Status Wound Number: 18 Primary Diabetic Wound/Ulcer of the Lower Extremity Etiology: Wound Location: Left, Plantar Metatarsal head first Wound Open Wounding Event: Gradually Appeared Status: Date Acquired: 08/23/2020 Comorbid Glaucoma, Sleep Apnea, Hypertension, Peripheral Arterial Disease, Weeks Of Treatment: 18 History: Peripheral Venous Disease, Type II Diabetes, Gout, Osteoarthritis, Clustered Wound: No Neuropathy Photos Wound Measurements Length: (cm) 1.2 Width: (cm) 1.5 Depth: (cm) 0.1 Area: (cm) 1.414 Volume: (cm) 0.141 % Reduction in Area: 90% % Reduction in Volume: 95% Epithelialization: Small (1-33%) Tunneling: No Undermining: No Wound Description Classification: Grade 2 Wound Margin: Distinct, outline attached Exudate Amount: Medium Exudate Type: Serosanguineous Exudate Color: red, brown Foul Odor After Cleansing: No Slough/Fibrino Yes Wound Bed Granulation Amount: Large (67-100%) Exposed Structure Granulation Quality: Red Fascia Exposed: No Necrotic Amount: Small (1-33%) Fat Layer (Subcutaneous Tissue) Exposed:  Yes Necrotic Quality: Eschar, Adherent Slough Tendon Exposed: No Muscle Exposed: No Joint Exposed: No Bone Exposed: No Treatment Notes Wound #18 (Metatarsal head first) Wound Laterality: Plantar, Left Cleanser Soap and Water Discharge Instruction: May shower and wash wound with dial antibacterial soap and water prior to dressing change. Wound Cleanser Discharge Instruction: Cleanse the wound with wound cleanser prior to applying a clean dressing using gauze sponges, not tissue or cotton  balls. Peri-Wound Care Triamcinolone 15 (g) Discharge Instruction: Use triamcinolone 15 (g) as directed Sween Lotion (Moisturizing lotion) Discharge Instruction: Apply moisturizing lotion as directed Topical Primary Dressing Hydrofera Blue Ready Foam, 2.5 x2.5 in Discharge Instruction: Apply to wound bed as instructed Secondary Dressing ABD Pad, 5x9 Discharge Instruction: Apply over primary dressing as directed. Secured With Compression Wrap CoFlex TLC XL 2-layer Compression System 4x7 (in/yd) Discharge Instruction: Apply CoFlex 2-layer compression as directed. (alt for 4 layer) Compression Stockings Add-Ons Electronic Signature(s) Signed: 08/17/2021 5:54:00 PM By: Dellie Catholic RN Entered By: Dellie Catholic on 08/17/2021 14:18:46 -------------------------------------------------------------------------------- Wound Assessment Details Patient Name: Date of Service: [name], [name] 08/17/2021 1:45 PM Medical Record Number: [id number] Patient Account Number: [id number] Date of Birth/Sex: Treating RN: 1950-07-27 (71 y.o. [name] Primary Care Kailah Pennel: [name] Other Clinician: Referring Roger Fasnacht: Treating Auriel Kist/Extender: Stormy Card in Treatment: 18 Wound Status Wound Number: 22 Primary Cyst Etiology: Wound Location: Left, Lateral Lower Leg Wound Open Wounding Event: Bump Status: Date Acquired: 06/03/2021 Comorbid Glaucoma, Sleep Apnea,  Hypertension, Peripheral Arterial Disease, Weeks Of Treatment: 10 History: Peripheral Venous Disease, Type II Diabetes, Gout, Osteoarthritis, Clustered Wound: No Neuropathy Photos Wound Measurements Length: (cm) 2 Width: (cm) 1 Depth: (cm) 0.8 Area: (cm) 1.571 Volume: (cm) 1.257 % Reduction in Area: 4.7% % Reduction in Volume: 4.7% Epithelialization: Small (1-33%) Tunneling: Yes Position (o'clock): 12 Maximum Distance: (cm) 15 Wound Description Classification: Full Thickness With Exposed Support Structures Exudate Amount: Large Exudate Type: Sanguinous Exudate Color: red Foul Odor After Cleansing: No Slough/Fibrino No Wound Bed Granulation Amount: Large (67-100%) Exposed Structure Granulation Quality: Red, Friable Fascia Exposed: No Necrotic Amount: None Present (0%) Fat Layer (Subcutaneous Tissue) Exposed: Yes Tendon Exposed: No Muscle Exposed: No Joint Exposed: No Bone Exposed: No Treatment Notes Wound #22 (Lower Leg) Wound Laterality: Left, Lateral Cleanser Soap and Water Discharge Instruction: May shower and wash wound with dial antibacterial soap and water prior to dressing change. Wound Cleanser Discharge Instruction: Cleanse the wound with wound cleanser prior to applying a clean dressing using gauze sponges, not tissue or cotton balls. Peri-Wound Care Triamcinolone 15 (g) Discharge Instruction: Use triamcinolone 15 (g) as directed Sween Lotion (Moisturizing lotion) Discharge Instruction: Apply moisturizing lotion as directed Topical Primary Dressing VAC Secondary Dressing Secured With Compression Wrap CoFlex TLC XL 2-layer Compression System 4x7 (in/yd) Discharge Instruction: Apply CoFlex 2-layer compression as directed. (alt for 4 layer) Compression Stockings Add-Ons Electronic Signature(s) Signed: 08/17/2021 5:54:00 PM By: Dellie Catholic RN Entered By: Dellie Catholic on 08/17/2021  14:32:55 -------------------------------------------------------------------------------- Vitals Details Patient Name: Date of Service: [name] Garfinkel. 08/17/2021 1:45 PM Medical Record Number: [id number] Patient Account Number: [id number] Date of Birth/Sex: Treating RN: [birth year] (71 y.o. [name] Primary Care Gavrielle Streck: [name] Other Clinician: Referring Sajjad Honea: Treating Roselie Cirigliano/Extender: Stormy Card in Treatment: 18 Vital Signs Time Taken: 13:54 Temperature (F): 98.6 Height (in): 74 Pulse (bpm): 68 Weight (lbs): 238 Respiratory Rate (breaths/min): 20 Body Mass Index (BMI): 30.6 Blood Pressure (mmHg): 134/73 Reference Range: 80 - 120 mg / dl Electronic Signature(s) Signed: 08/17/2021 5:54:00 PM By: Dellie Catholic RN Entered By: Dellie Catholic on 08/17/2021 14:26:53

## 2021-08-21 ENCOUNTER — Other Ambulatory Visit: Payer: Self-pay

## 2021-08-21 ENCOUNTER — Encounter (HOSPITAL_BASED_OUTPATIENT_CLINIC_OR_DEPARTMENT_OTHER): Payer: Medicare Other | Admitting: General Surgery

## 2021-08-21 DIAGNOSIS — E11621 Type 2 diabetes mellitus with foot ulcer: Secondary | ICD-10-CM | POA: Diagnosis not present

## 2021-08-21 NOTE — Progress Notes (Signed)
[name], [name] ([id number]) Visit Report for 08/21/2021 SuperBill Details Patient Name: Date of Service: [name], [name] 08/21/2021 Medical Record Number: [id number] Patient Account Number: [id number] Date of Birth/Sex: Treating RN: Jan 26, 1951 (71 y.o. 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

## 2021-08-22 NOTE — Progress Notes (Signed)
[name] ([id number]) Visit Report for 08/17/2021 HPI Details Patient Name: Date of Service: [name] 08/17/2021 1:45 PM Medical Record Number: [id number] Patient Account Number: [id number] Date of Birth/Sex: Treating RN: 16-Aug-1950 (71 y.o. [name] Primary Care Provider: Jilda [name] Other Clinician: Referring Provider: Treating Provider/Extender: [name] in Treatment: 18 History of Present Illness HPI Description: 10/11/17; [name] is a 71 year old man who tells me that in 2015 he slipped down the latter traumatizing his left leg. He developed a wound in the same spot the area that we are currently looking at. He states this closed over for the most part although he always felt it was somewhat unstable. In 2016 he hit the same area with the door of his car had this reopened. He tells me that this is never really closed although sometimes an inflow it remains open on a constant basis. He has not been using any specific dressing to this except for topical antibiotics the nature of which were not really sure. His primary doctor did send him to see [name] of interventional cardiology. He underwent an angiogram on 08/06/17 and he underwent a PTA and directional atherectomy of the lesser distal SFA and popliteal arteries which resulted in brisk improvement in blood flow. It was noted that he had 2 vessel runoff through the anterior tibial and peroneal. He is also been to see vascular and interventional radiologist. He was not felt to have any significant superficial venous insufficiency. Presumably is not a candidate for any ablation. It was suggested he come here for wound care. The patient is a type II diabetic on insulin. He also has a history of venous insufficiency. ABIs on the left were noncompressible in our clinic 10/21/17; patient we admitted to the clinic last week. 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 We put Iodosorb on him after an aggressive debridement and 3 layer compression. He complained of pain in his ankle and itching with is skin in fact he scratched the area on the medial calf superiorly at the rim of our wraps and he has 2 small open areas in that location today which are new. I changed his primary dressing today to silver collagen. As noted he is already had revascularization and does not have any significant superficial venous insufficiency that would be amenable to ablation 10/28/17; patient admitted to the clinic 2 weeks ago. He has a smaller Wound. Scratch injury from last week revealed. There is large wound over the tibial area. This is smaller. Granulation looks healthy. No need for debridement. 11/04/17; the wound on the left lateral calf looks better. Improved dimensions. Surface of this looks better. We've been maintaining him and Kerlix Coban wraps. He finds this much more comfortable. Silver collagen dressing 11/11/17; left lateral Wound continues to look healthy be making progress. Using a #5 curet I removed removed nonviable skin from the surface of the wound and then necrotic debris from the wound surface. Surface of the wound continues to look healthy. He also has an open area on the left great toenail bed. We've been using topical antibiotics. 11/19/17; left anterior lateral wound continues to look healthy but it's not closed. He also had a small wound above this on the left leg Initially traumatic wounds in the setting of significant chronic venous insufficiency and stasis dermatitis 11/25/17; left anterior wounds superiorly is closed still a small wound inferiorly. 12/02/17;  left anterior tibial area. Arrives today with adherent callus. Post debridement clearly not completely closed. Hydrofera Blue under 3 layer compression. 12/09/17; left anterior tibia. Circumferential eschar however the wound bed looks stable to improved. We've been  using Hydrofera Blue under 3 layer compression 12/17/17; left anterior tibia. Apparently this was felt to be closed however when the wrap was taken off there is a skin tear to reopen wounds in the same area we've been using Hydrofera Blue under 3 layer compression 12/23/17 left anterior tibia. Not close to close this week apparently the [facility] was stuck to this again. Still circumferential eschar requiring debridement. I put a contact layer on this this time under the Hydrofera Blue 12/31/17; left anterior tibia. Wound is better slight amount of hyper-granulation. Using Hydrofera Blue over Adaptic. 01/07/18; left anterior tibia. The wound had some surface eschar however after this was removed he has no open wound.he was already revascularized by [name] when he came to our clinic with atherectomy of the left SFA and popliteal artery. He was also sent to interventional radiology for venous reflux studies. He was not felt to have significant reflux but certainly has chronic venous changes of his skin with hemosiderin deposition around this area. He will definitely need to lubricate his skin and wear compression stocking and I've talked to him about this. READMISSION 05/26/2018 This is a now 71 year old man we cared for with traumatic wounds on his left anterior lower extremity. He had been previously revascularized during that admission by [name]. Apparently in follow-up [name] noted that he had deterioration in his arterial status. He underwent a stent placement in the distal left SFA on 04/22/2018. 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 He went back to the angiography suite on 04/30/2018 he underwent PTA and balloon angioplasty of the occluded left mid anterior tibial artery, thrombotic occlusion went from 100 to 0% which reconstitutes the posterior tibial artery. He had thrombectomy and aspiration of the peroneal artery. The stent placed in the distal SFA left SFA was  still occluded. 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 He has been recuperating at [place] home in [facility] after the most recent hospitalization. He is going home tomorrow. He tells me that 2 weeks ago he traumatized the tip of his left fifth toe. He came in urgently for our review of this. This was a history of before I noted that [name] had already noted dry gangrenous changes of the left fifth toe 06/09/2018; 2-week follow-up. I did contact [name] after his last appointment and he apparently saw 1 of Dr. Irven Shelling colleagues the next day. He does not follow-up with [name] himself until Thursday of this week. 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 Nevertheless there is no evidence of infection no drainage and no pain. He had a new area that this week when we were signing him in today on the left anterior mid tibia area, this is in close proximity to the previous wound we have dealt with in this clinic. 06/23/2018; 2-week follow-up. I did not receive a recent note from [name] to review today. Our office is trying to obtain this. He is apparently  not planning to do further vascular interventions and wondered about compression to try and help with the patient's chronic venous insufficiency. However we are also concerned about the arterial flow. He arrives in clinic today with a new area on the left third toe. The areas on the calf/anterior tibia are close to closing. The left fifth toe is still mummified using Betadine. -In reviewing things with the patient he has what sounds like claudication with mild to moderate amount of activity. 06/27/2018; x-ray of his foot suggested osteomyelitis of the left third  toe. I prescribed Levaquin over the phone while we attempted to arrange a plan of care. However the patient called yesterday to report he had low-grade fever and he came in today acutely. There is been a marked deterioration in the left third toe with spreading cellulitis up into the dorsal left foot. He was referred to the emergency room. Readmission: 06/29/2020 patient presents today for reevaluation here in our clinic he was previously treated by Dr. Dellia Nims at the latter part of 2019 in 2 the beginning of 2020. Subsequently we have not seen him since that time in the interim he did have evaluation with vein and vascular specialist specifically Dr. Anice Paganini who did perform quite extensive work for a left femoral to anterior tibial artery bypass. With that being said in the interim the patient has developed significant lymphedema and has wounds that he tells me have really never healed in regard to the incision site on the left leg. He also has multiple wounds on the feet for various reasons some of which is that he tends to pick at his feet. Fortunately there is no signs of active infection systemically at this time he does have some wounds that are little bit deeper but most are fairly superficial he seems to have good blood flow and overall everything appears to be healthy I see no bone exposed and no obvious signs of osteomyelitis. I do not know that he necessarily needs a x-ray at this point although that something we could consider depending on how things progress. The patient does have a history of lymphedema, diabetes, this is type II, chronic kidney disease stage III, hypertension, and history of peripheral vascular disease. 07/05/2020; patient admitted last week. Is a patient I remember from 2019 he had a spreading infection involving the left foot and we sent him to the hospital. 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 He subsequently had a left  femoral to anterior tibial bypass by [facility]. He also has severe lymphedema with chronic skin changes related to that on the left leg. The most problematic area that was new today was on the left medial great toe. This was apparently a small area last week there was purulent drainage which our intake nurse cultured. Also areas on the left medial foot and heel left lateral foot. He has 2 areas on the left medial calf left lateral calf in the setting of the severe lymphedema. 07/13/2020 on evaluation today patient appears to be doing better in my opinion compared to his last visit. The good news is there is no signs of active infection systemically and locally I do not see any signs of infection either. He did have an x-ray which was negative that is great news he had a culture which showed MRSA but at the same time he is been on the doxycycline which has helped. I do think we may  want to extend this for 7 additional days 1/25; patient admitted to the clinic a few weeks ago. 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 We have been putting him under compression his edema control is a lot better but he is severe verricused skin on the left leg. He is really done quite well he still has an open area on the left medial calf and the left medial first metatarsal head. We have been using silver collagen on the leg silver alginate on the foot 07/27/2020 upon evaluation today patient appears to be doing decently well in regard to his wounds. He still has a lot of dry skin on the left leg. Some of this is starting to peel back and I think he may be able to have them out by removing some that today. Fortunately there is no signs of active infection at this time on the left leg although on the right leg he does appear to have swelling and erythema as well as some mild warmth to touch. This does have been concerned about the possibility of cellulitis although  within the differential diagnosis I do think that potentially a DVT has to be at least considered. We need to rule that out before proceeding would just call in the cellulitis. Especially since he is having pain in the posterior aspect of his calf muscle. 2/8; the patient had seen sparingly. He has severe skin changes of chronic lymphedema in the left leg thickened hyperkeratotic verrucous skin. He has an open wound on the medial part of the left first met head left mid tibia. He also has a rim of nonepithelialized skin in the anterior mid tibia. He brought in the AmLactin lotion that was been prescribed although I am not sure under compression and its utility. There concern about cellulitis on the right lower leg the last time he was here. He was put on on antibiotics. His DVT rule out was negative. The right leg looks fine he is using his stocking on this area 08/10/2020 upon evaluation today patient appears to be doing well with regard to his leg currently. He has been tolerating the dressing changes without complication. Fortunately there is no signs of active infection which is great news. Overall very pleased with where things stand. 2/22; the patient still has an area on the medial part of the left first met his head. This looks better than when I last saw this earlier this month he has a rim of epithelialization but still some surface debris. Mostly everything on the left leg is healed. There is still a vulnerable in the left mid tibia area. 08/30/2020 upon evaluation today patient appears to be doing much better in regard to his wounds on his foot. 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 Nonetheless I think we will address that today I Minna send in a prescription for him in that regard. Overall though there does not appear to be any signs of significant  worsening. 09/07/2020 on evaluation today patient's wounds over his left foot appear to be doing excellent. I do not see any signs of infection there is some callus buildup this can require debridement for certain but overall I feel like he is managing quite nicely. He still using the AmLactin cream which has been beneficial for him as well. 3/22; left foot  wound is closed. There is no open area here. He is using ammonium lactate lotion to the lower extremities to help exfoliate dry cracked skin. He has compression stockings from elastic therapy in [place]. The wound on the medial part of his left first met head is healed today. READMISSION 04/12/2021 Mr. Gasaway is a patient we know fairly well he had a prolonged stay in clinic in 2019 with wounds on his left lateral and left anterior lower extremity in the setting of chronic venous insufficiency. More recently he was here earlier this year with predominantly an area on his left foot first metatarsal head plantar and he says the plantar foot broke down on its not long after we discharged him but he did not come back here. The last few months areas of broken down on his left anterior and again the left lateral lower extremity. The leg itself is very swollen chronically enlarged a lot of hyperkeratotic dry Berry Q skin in the left lower leg. His edema extends well into the thigh. He was seen by Dr. Donzetta Matters. He had ABIs on 03/02/2021 showing an ABI on the right of 1 with a TBI of 0.72 his ABI in the left at 1.09 TBI of 0.99. Monophasic and biphasic waveforms on the right. On the left monophasic waveforms were noted he went on to have an angiogram on 03/27/2021 this showed the aortic aortic and iliac segments were free of flow-limiting stenosis the left common femoral vein to evaluate the left femoral to anterior tibial artery bypass was unobstructed the bypass was patent without any areas of stenosis. We discharged the patient in bilateral juxta lite  stockings but very clearly that was not sufficient to control the swelling and maintain skin integrity. He is clearly going to need compression pumps. 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 This is fortunate because he is on his feet for long periods of time. 10/27; patient comes in with our intake nurse reporting copious amount of green drainage from the left anterior mid tibia the left dorsal foot and to a lesser extent the left medial mid tibia. We left the compression wrap on all week for the amount of edema in his left leg is quite a bit better. We use silver alginate as the primary dressing 11/3; edema control is good. Left anterior lower leg left medial lower leg and the plantar first metatarsal head. The left anterior lower leg required debridement. Deep tissue culture I did of this wound showed MRSA I put him on 10 days of doxycycline which she will start today. We have him in compression wraps. He has a security [name] and [employer] however he is retiring on November 15. We will need to then get him into a better offloading boot for the left foot perhaps a total contact cast 11/10; edema control is quite good. Left anterior and left medial lower leg wounds in the setting of chronic venous insufficiency and lymphedema. He also has a substantial area over the left plantar first metatarsal head. 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 He has significant hypergranulation on the left plantar foot wound. The patient is a diabetic but he does not have significant PAD 11/17; edema control is quite good. Left anterior and left medial lower leg wounds look better. The really concerning area remains the area  on the left plantar first metatarsal head. He has a rim of epithelialization. He has been using a surgical shoe The patient is now retired from a a  [employer] I have gone over with him the need to offload this area aggressively. Starting today with a forefoot off loader but . possibly a total contact cast. 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 Arrives with a very significant odor from I think all of the wounds on the left leg and the left foot. Because of this I did not put a total contact cast on him today but will could still consider this. His wife was having cataract surgery which is the reason he missed the appointment 12/6. I saw this man 5 days ago with a swelling below the popliteal fossa. I thought he actually might have a Baker's cyst however the DVT rule out study that we could arrange right away was negative the technician told me this was not a ruptured Baker's cyst. We attempted to get this aspirated by under ultrasound guidance in interventional radiology however all they did was an ultrasound however it shows an extensive fluid collection 62 x 8 x 9.4 in the left thigh and left calf. The patient states he thinks this started 8 days ago or so but he really is not complaining of any pain, fever or systemic symptoms. He has not ha 12/20; after some difficulty I managed to get the patient into see Dr. Donzetta Matters. Eventually he was taken into the hospital and had a drain put in the fluid collection below his left knee posteriorly extending into the posterior thigh. He still has the drain in place. Culture of this showed moderate staff aureus few Morganella and few Klebsiella he is now on doxycycline and ciprofloxacin as suggested by infectious disease he is on this for a month. The drain will remain in place until it stops draining 12/29; he comes in today with the 1 wound on his left leg and the area on the left plantar first met head significantly smaller. Both look healthy. He still has the drain in the left leg. He says  he has to change this daily. Follows up with Dr. Donzetta Matters on January 11. 06/29/2021; the wounds that I am following on the left leg and left first met head continued to be quite healthy. However the area where his inferior drain is in place had copious amounts of drainage which was green in color. The wound here is larger. Follows up with Dr. Gwenlyn Saran of vein and vascular his surgeon next week as well as infectious disease. He remains on ciprofloxacin and doxycycline. He is not complaining of excessive pain in either one of the drain areas 1/12; the patient saw vascular surgery and infectious disease. Vascular surgery has left the drain in place as there was still some notable drainage still see him back in 2 weeks. Dr. Velna Ochs stop the doxycycline and ciprofloxacin and I do not believe he follows up with them at this point. Culture I did last week showed both doxycycline resistant MRSA and Pseudomonas not sensitive to ciprofloxacin although only in rare titers 1/19; the patient's wound on the left anterior lower leg is just about healed. We have continued healing of the area that was medially on the left leg. Left first plantar metatarsal head continues to get smaller. The major problem here  is his 2 drain sites 1 on the left upper calf and lateral thigh. There is purulent drainage still from the left lateral thigh. I gave him antibiotics last week but we still have recultured. He has the drain in the area I think this is eventually going to have to come out. I suspect there will be a connecting wound to heal here perhaps with improved VAc 1/26; the patient had his drain removed by vein and vascular on 1/25/. This was a large pocket of fluid in his left thigh that seem to tunnel into his left upper calf. 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 His mention his Penrose drain was removed today. He now has a tunneling wound on his left calf and left thigh. Both of these probe widely towards each  other although I cannot really prove that they connect. Both wounds on his lower leg anteriorly are closed and his area over the first metatarsal head on his right foot continues to improve. We are using Hydrofera Blue here. He also saw infectious disease culture of the abscess they noted was polymicrobial with MRSA, Morganella and Klebsiella he was treated with doxycycline and ciprofloxacin for 4 weeks ending on 07/03/2021. They did not recommend any further antibiotics. Notable that while he still had the Penrose drain in place last week he had purulent drainage coming out of the inferior IandD site this grew [facility], MRSA and Pseudomonas but there does not appear to be any active infection in this area today with the drain out and he is not systemically unwell 2/2; with regards to the drain sites the superior one on the thigh actually is closed down the one on the upper left lateral calf measures about 8 and half centimeters which is an improvement seems to be less prominent although still with a lot of drainage. The only remaining wound is over the first metatarsal head on the left foot and this looks to be continuing to improve with Hydrofera Blue. 2/9; the area on his plantar left foot continues to contract. Callus around the wound edge. The drain sites specifically have not come down in depth. We put the wound VAC on Monday he changed the canister late last night our intake nurse reported a pocket of fluid perhaps caused by our compression wraps 2/16; continued improvement in left foot plantar wound. drainage site in the calf is not improved in terms of depth (wound vac) 2/23; continued improvement in the left foot wound over the first metatarsal head. With regards to the drain sites the area on his thigh laterally is healed however the open area on his calf is small in terms of circumference by still probes in by about 15 cm. Within using the wound VAC. Hydrofera Blue on  his foot Electronic Signature(s) Signed: 08/17/2021 5:03:55 PM By: Linton Ham MD Entered By: Linton Ham on 08/17/2021 14:38:13 -------------------------------------------------------------------------------- Physical Exam Details Patient Name: Date of Service: [name] 08/17/2021 1:45 PM Medical Record Number: [id number] Patient Account Number: [id number] Date of Birth/Sex: Treating RN: Oct 28, 1950 (71 y.o. [name] Primary Care Provider: Jilda [name] Other Clinician: Referring Provider: Treating Provider/Extender: [name] in Treatment: 18 Constitutional Sitting or standing Blood Pressure is within target range for patient.. Pulse regular and within target range for patient.Marland Kitchen Respirations regular, non-labored and within target range.. Temperature is normal and within the target range for the patient.Marland Kitchen Appears in no distress. Notes would exam; the area over the  plantar first metatarsal head continues to be smaller in healthy granulation. We're using Hydrofera Blue in this area no debridement is necessary. That all from his JP drain on the left lateral calf still probes up 15 cm but the overall circumference/diameter of this area is a lot smaller. Electronic Signature(s) Signed: 08/17/2021 5:03:55 PM By: Linton Ham MD Entered By: Linton Ham on 08/17/2021 14:41:12 -------------------------------------------------------------------------------- Physician Orders Details Patient Name: Date of Service: [name]. 08/17/2021 1:45 PM Medical Record Number: [id number] Patient Account Number: [id number] Date of Birth/Sex: Treating RN: August 06, 1950 (71 y.o. [name] Primary Care Provider: Jilda [name] Other Clinician: Referring Provider: Treating Provider/Extender: [name] in Treatment: 18 Verbal / Phone Orders: No Diagnosis Coding ICD-10 Coding Code Description E11.621 Type 2 diabetes  mellitus with foot ulcer 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 I89.0 Lymphedema, not elsewhere classified I87.322 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verity 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 - Dr Kaylor Maiers/Dr Celine Ahr on Thursday room 2 Return A Nurse Visit: - Monday for [facility] change Bathing/ Shower/ Hygiene May shower with protection but do not get wound dressing(s) wet. - Use a cast protector so you can shower without getting your wrap(s) wet Negative Presssure Wound Therapy Wound #22 Left,Lateral Lower Leg Wound Vac to wound continuously at 129mm/hg pressure - change 2 times per week in clinic Black Foam Sterile Gauze Packing - in tunnel Edema Control - Lymphedema / SCD / Other Elevate legs to the level of the heart or above for 30 minutes daily and/or when sitting, a frequency of: - throughout the day Avoid standing for long periods of time. Patient to wear own compression stockings every day. - on right leg; Moisturize legs daily. - Ammonium LACTATE to BLE every day. Off-Loading Wedge shoe to: - left front foot offloader Wound Treatment Wound #18 - Metatarsal head first Wound Laterality: Plantar, Left Cleanser: Soap and Water 2 x Per Week/7 Days Discharge Instructions: May shower and wash wound with dial antibacterial soap and water prior to dressing change. Cleanser: Wound Cleanser 2 x Per Week/7 Days Discharge Instructions: Cleanse the wound with wound cleanser prior to applying a clean dressing using gauze sponges, not tissue or cotton balls. Peri-Wound Care: Triamcinolone 15 (g) 2 x Per Week/7 Days Discharge Instructions:  Use triamcinolone 15 (g) as directed Peri-Wound Care: Sween Lotion (Moisturizing lotion) 2 x Per Week/7 Days Discharge Instructions: Apply moisturizing lotion as directed Prim Dressing: Hydrofera Blue Ready Foam, 2.5 x2.5 in 2 x Per Week/7 Days ary Discharge Instructions: Apply to wound bed as instructed Secondary Dressing: ABD Pad, 5x9 2 x Per Week/7 Days Discharge Instructions: Apply over primary dressing as directed. Compression Wrap: CoFlex TLC XL 2-layer Compression System 4x7 (in/yd) 2 x Per Week/7 Days Discharge Instructions: Apply CoFlex 2-layer compression as directed. (alt for 4 layer) Wound #22 - Lower Leg Wound Laterality: Left, Lateral Cleanser: Soap and Water 2 x Per Week/7 Days Discharge Instructions: May shower and wash wound with dial antibacterial soap and water prior to dressing change. Cleanser: Wound Cleanser 2 x Per Week/7 Days Discharge Instructions: Cleanse the wound with wound cleanser prior to applying a clean dressing using gauze sponges, not tissue or cotton balls. Peri-Wound  Care: Triamcinolone 15 (g) 2 x Per Week/7 Days Discharge Instructions: Use triamcinolone 15 (g) as directed Peri-Wound Care: Sween Lotion (Moisturizing lotion) 2 x Per Week/7 Days Discharge Instructions: Apply moisturizing lotion as directed Prim Dressing: VAC ary 2 x Per Week/7 Days Compression Wrap: CoFlex TLC XL 2-layer Compression System 4x7 (in/yd) 2 x Per Week/7 Days Discharge Instructions: Apply CoFlex 2-layer compression as directed. (alt for 4 layer) Electronic Signature(s) Signed: 08/17/2021 5:03:55 PM By: Linton Ham MD Signed: 08/17/2021 5:54:00 PM By: Dellie Catholic RN Entered By: Dellie Catholic on 08/17/2021 14:39:53 -------------------------------------------------------------------------------- Problem List Details Patient Name: Date of Service: [name]. 08/17/2021 1:45 PM Medical Record Number: [id number] Patient Account Number: [id number] Date of  Birth/Sex: Treating RN: 03/24/1951 (71 y.o. [name] Primary Care Provider: Jilda [name] Other Clinician: Referring Provider: Treating Provider/Extender: [name] in Treatment: 18 Active Problems ICD-10 Encounter Code Description Active Date MDM Diagnosis E11.621 Type 2 diabetes mellitus with foot ulcer 04/12/2021 No Yes 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 04/12/2021 No Yes I89.0 Lymphedema, not elsewhere classified 04/12/2021 No Yes I87.322 Chronic venous hypertension (idiopathic) with inflammation of left lower 04/12/2021 No Yes extremity 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 ulcer of other part of left lower leg with other specified 04/12/2021 No Yes severity 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 of other part of left foot with other specified 04/12/2021 No Yes severity 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 07/20/2021 No Yes Inactive Problems ICD-10 Code Description Active Date Inactive Date E11.42 Type 2 diabetes mellitus with diabetic polyneuropathy 04/12/2021 04/12/2021 L02.416 Cutaneous abscess of left lower limb 06/13/2021 06/13/2021 Resolved Problems Electronic Signature(s) Signed: 08/17/2021 5:03:55 PM By: Linton Ham MD Entered By: Linton Ham on 08/17/2021 14:35:11 -------------------------------------------------------------------------------- Progress Note Details Patient Name: Date of Service: [name]. 08/17/2021 1:45 PM Medical Record Number: [id number] Patient Account Number: [id number] Date of Birth/Sex: Treating RN: 17-May-1951 (71 y.o. [name] Primary Care Provider: Jilda [name] Other Clinician: Referring Provider: Treating Provider/Extender: [name] in Treatment: 18 Subjective History of Present Illness (HPI) 10/11/17; Mr. Nehme is a 71 year old man who tells me that in 2015 he slipped down  the latter traumatizing his left leg. He developed a wound in the same spot the area that we are currently looking at. He states this closed over for the most part although he always felt it was somewhat unstable. In 2016 he hit the same area with the door of his car had this reopened. He tells me that this is never really closed although sometimes an inflow it remains open on a constant basis. He has not been using any specific dressing to this except for topical antibiotics the nature of which were not really sure. His primary doctor did send him to see [name] of interventional cardiology. He underwent an angiogram on 08/06/17 and he underwent a PTA and directional atherectomy of the lesser distal SFA and popliteal arteries which resulted in brisk improvement in blood flow. It was noted that he had 2 vessel runoff through the anterior tibial and peroneal. He is also been to see vascular and interventional radiologist. He was not felt to have any significant superficial venous insufficiency. Presumably is not a candidate for any ablation. It was suggested he come here for wound care. The patient is a type II diabetic on insulin. He also has a history of venous insufficiency. ABIs on the left were noncompressible in our clinic  10/21/17; patient we admitted to the clinic last week. 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 We put Iodosorb on him after an aggressive debridement and 3 layer compression. He complained of pain in his ankle and itching with is skin in fact he scratched the area on the medial calf superiorly at the rim of our wraps and he has 2 small open areas in that location today which are new. I changed his primary dressing today to silver collagen. As noted he is already had revascularization and does not have any significant superficial venous insufficiency that would be amenable to ablation 10/28/17; patient admitted  to the clinic 2 weeks ago. He has a smaller Wound. Scratch injury from last week revealed. There is large wound over the tibial area. This is smaller. Granulation looks healthy. No need for debridement. 11/04/17; the wound on the left lateral calf looks better. Improved dimensions. Surface of this looks better. We've been maintaining him and Kerlix Coban wraps. He finds this much more comfortable. Silver collagen dressing 11/11/17; left lateral Wound continues to look healthy be making progress. Using a #5 curet I removed removed nonviable skin from the surface of the wound and then necrotic debris from the wound surface. Surface of the wound continues to look healthy. ooHe also has an open area on the left great toenail bed. We've been using topical antibiotics. 11/19/17; left anterior lateral wound continues to look healthy but it's not closed. ooHe also had a small wound above this on the left leg ooInitially traumatic wounds in the setting of significant chronic venous insufficiency and stasis dermatitis 11/25/17; left anterior wounds superiorly is closed still a small wound inferiorly. 12/02/17; left anterior tibial area. Arrives today with adherent callus. Post debridement clearly not completely closed. Hydrofera Blue under 3 layer compression. 12/09/17; left anterior tibia. Circumferential eschar however the wound bed looks stable to improved. We've been using Hydrofera Blue under 3 layer compression 12/17/17; left anterior tibia. Apparently this was felt to be closed however when the wrap was taken off there is a skin tear to reopen wounds in the same area we've been using Hydrofera Blue under 3 layer compression 12/23/17 left anterior tibia. Not close to close this week apparently the [facility] was stuck to this again. Still circumferential eschar requiring debridement. I put a contact layer on this this time under the Hydrofera Blue 12/31/17; left anterior tibia. Wound is better slight amount  of hyper-granulation. Using Hydrofera Blue over Adaptic. 01/07/18; left anterior tibia. The wound had some surface eschar however after this was removed he has no open wound.he was already revascularized by [name] when he came to our clinic with atherectomy of the left SFA and popliteal artery. He was also sent to interventional radiology for venous reflux studies. He was not felt to have significant reflux but certainly has chronic venous changes of his skin with hemosiderin deposition around this area. He will definitely need to lubricate his skin and wear compression stocking and I've talked to him about this. READMISSION 05/26/2018 This is a now 71 year old man we cared for with traumatic wounds on his left anterior lower extremity. He had been previously revascularized during that admission by [name]. Apparently in follow-up [name] noted that he had deterioration in his arterial status. He underwent a stent placement in the distal left SFA on 04/22/2018. 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 He went back to the angiography  suite on 04/30/2018 he underwent PTA and balloon angioplasty of the occluded left mid anterior tibial artery, thrombotic occlusion went from 100 to 0% which reconstitutes the posterior tibial artery. He had thrombectomy and aspiration of the peroneal artery. The stent placed in the distal SFA left SFA was still occluded. 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 He has been recuperating at [place] [facility] in [facility] after the most recent hospitalization. He is going home tomorrow. He tells me that 2 weeks ago he traumatized the tip of his left fifth toe. He came in urgently for our review of  this. This was a history of before I noted that [name] had already noted dry gangrenous changes of the left fifth toe 06/09/2018; 2-week follow-up. I did contact [name] after his last appointment and he apparently saw 1 of Dr. Irven Shelling colleagues the next day. He does not follow-up with [name] himself until Thursday of this week. 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 Nevertheless there is no evidence of infection no drainage and no pain. He had a new area that this week when we were signing him in today on the left anterior mid tibia area, this is in close proximity to the previous wound we have dealt with in this clinic. 06/23/2018; 2-week follow-up. I did not receive a recent note from [name] to review today. Our office is trying to obtain this. He is apparently not planning to do further vascular interventions and wondered about compression to try and help with the patient's chronic venous insufficiency. However we are also concerned about the arterial flow. ooHe arrives in clinic today with a new area on the left third toe. The areas on the calf/anterior tibia are close to closing. The left fifth toe is still mummified using Betadine. -In reviewing things with the patient he has what sounds like claudication with mild to moderate amount of activity. 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 I prescribed Levaquin over the phone while we attempted to arrange a plan of care. However the patient called yesterday to report he had low-grade fever and he came in today acutely. There is been a marked deterioration in the left third toe with spreading cellulitis up into the dorsal left foot. He was referred to the emergency room. Readmission: 06/29/2020 patient presents today for reevaluation here in our clinic he was previously treated by Dr. Dellia Nims at the latter part of 2019 in 2 the beginning of 2020. Subsequently we have not seen him  since that time in the interim he did have evaluation with vein and vascular specialist specifically Dr. Anice Paganini who did perform quite extensive work for a left femoral to anterior tibial artery bypass. With that being said in the interim the patient has developed significant lymphedema and has wounds that he tells me have really never healed in regard to the incision site on the left leg. He also has multiple wounds on the feet for various reasons some of which is that he tends to pick at his feet. Fortunately there is no signs of active infection systemically at this time he does have some wounds that are little  bit deeper but most are fairly superficial he seems to have good blood flow and overall everything appears to be healthy I see no bone exposed and no obvious signs of osteomyelitis. I do not know that he necessarily needs a x-ray at this point although that something we could consider depending on how things progress. The patient does have a history of lymphedema, diabetes, this is type II, chronic kidney disease stage III, hypertension, and history of peripheral vascular disease. 07/05/2020; patient admitted last week. Is a patient I remember from 2019 he had a spreading infection involving the left foot and we sent him to the hospital. 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 He subsequently had a left femoral to anterior tibial bypass by [facility]. He also has severe lymphedema with chronic skin changes related to that on the left leg. The most problematic area that was new today was on the left medial great toe. This was apparently a small area last week there was purulent drainage which our intake nurse cultured. Also areas on the left medial foot and heel left lateral foot. He has 2 areas on the left medial calf left lateral calf in the setting of the severe lymphedema. 07/13/2020 on evaluation today patient appears to be doing  better in my opinion compared to his last visit. The good news is there is no signs of active infection systemically and locally I do not see any signs of infection either. He did have an x-ray which was negative that is great news he had a culture which showed MRSA but at the same time he is been on the doxycycline which has helped. I do think we may want to extend this for 7 additional days 1/25; patient admitted to the clinic a few weeks ago. 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 We have been putting him under compression his edema control is a lot better but he is severe verricused skin on the left leg. He is really done quite well he still has an open area on the left medial calf and the left medial first metatarsal head. We have been using silver collagen on the leg silver alginate on the foot 07/27/2020 upon evaluation today patient appears to be doing decently well in regard to his wounds. He still has a lot of dry skin on the left leg. Some of this is starting to peel back and I think he may be able to have them out by removing some that today. Fortunately there is no signs of active infection at this time on the left leg although on the right leg he does appear to have swelling and erythema as well as some mild warmth to touch. This does have been concerned about the possibility of cellulitis although within the differential diagnosis I do think that potentially a DVT has to be at least considered. We need to rule that out before proceeding would just call in the cellulitis. Especially since he is having pain in the posterior aspect of his calf muscle. 2/8; the patient had seen sparingly. He has severe skin changes of chronic lymphedema in the left leg thickened hyperkeratotic verrucous skin. He has an open wound on the medial part of the left first met head left mid tibia. He also has a rim of nonepithelialized skin in the anterior mid tibia. He  brought in the AmLactin lotion that was been  prescribed although I am not sure under compression and its utility. There concern about cellulitis on the right lower leg the last time he was here. He was put on on antibiotics. His DVT rule out was negative. The right leg looks fine he is using his stocking on this area 08/10/2020 upon evaluation today patient appears to be doing well with regard to his leg currently. He has been tolerating the dressing changes without complication. Fortunately there is no signs of active infection which is great news. Overall very pleased with where things stand. 2/22; the patient still has an area on the medial part of the left first met his head. This looks better than when I last saw this earlier this month he has a rim of epithelialization but still some surface debris. Mostly everything on the left leg is healed. There is still a vulnerable in the left mid tibia area. 08/30/2020 upon evaluation today patient appears to be doing much better in regard to his wounds on his foot. 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 Nonetheless I think we will address that today I Minna send in a prescription for him in that regard. Overall though there does not appear to be any signs of significant worsening. 09/07/2020 on evaluation today patient's wounds over his left foot appear to be doing excellent. I do not see any signs of infection there is some callus buildup this can require debridement for certain but overall I feel like he is managing quite nicely. He still using the AmLactin cream which has been beneficial for him as well. 3/22; left foot wound is closed. There is no open area here. He is using ammonium lactate lotion to the lower extremities to help exfoliate dry cracked skin. He has compression stockings from elastic therapy in  [place]. The wound on the medial part of his left first met head is healed today. READMISSION 04/12/2021 Mr. Warmoth is a patient we know fairly well he had a prolonged stay in clinic in 2019 with wounds on his left lateral and left anterior lower extremity in the setting of chronic venous insufficiency. More recently he was here earlier this year with predominantly an area on his left foot first metatarsal head plantar and he says the plantar foot broke down on its not long after we discharged him but he did not come back here. The last few months areas of broken down on his left anterior and again the left lateral lower extremity. The leg itself is very swollen chronically enlarged a lot of hyperkeratotic dry Berry Q skin in the left lower leg. His edema extends well into the thigh. He was seen by Dr. Donzetta Matters. He had ABIs on 03/02/2021 showing an ABI on the right of 1 with a TBI of 0.72 his ABI in the left at 1.09 TBI of 0.99. Monophasic and biphasic waveforms on the right. On the left monophasic waveforms were noted he went on to have an angiogram on 03/27/2021 this showed the aortic aortic and iliac segments were free of flow-limiting stenosis the left common femoral vein to evaluate the left femoral to anterior tibial artery bypass was unobstructed the bypass was patent without any areas of stenosis. We discharged the patient in bilateral juxta lite stockings but very clearly that was not sufficient to control the swelling and maintain skin integrity. He is clearly going  to need compression pumps. 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 This is fortunate because he is on his feet for long periods of time. 10/27; patient comes in with our intake nurse reporting copious amount of green drainage from the left anterior mid tibia the left dorsal foot and to a lesser extent the left medial mid tibia. We left the compression wrap on all week for the  amount of edema in his left leg is quite a bit better. We use silver alginate as the primary dressing 11/3; edema control is good. Left anterior lower leg left medial lower leg and the plantar first metatarsal head. The left anterior lower leg required debridement. Deep tissue culture I did of this wound showed MRSA I put him on 10 days of doxycycline which she will start today. We have him in compression wraps. He has a security [name] and [employer] however he is retiring on November 15. We will need to then get him into a better offloading boot for the left foot perhaps a total contact cast 11/10; edema control is quite good. Left anterior and left medial lower leg wounds in the setting of chronic venous insufficiency and lymphedema. He also has a substantial area over the left plantar first metatarsal head. 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 He has significant hypergranulation on the left plantar foot wound. The patient is a diabetic but he does not have significant PAD 11/17; edema control is quite good. Left anterior and left medial lower leg wounds look better. The really concerning area remains the area on the left plantar first metatarsal head. He has a rim of epithelialization. 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 Starting today with a forefoot off loader but . possibly a total contact cast. 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 Arrives with a very significant odor from I think all of the wounds on the left leg and the left foot. Because of this I did not put a total contact cast on him today but will could still consider this. His wife was having cataract surgery  which is the reason he missed the appointment 12/6. I saw this man 5 days ago with a swelling below the popliteal fossa. I thought he actually might have a Baker's cyst however the DVT rule out study that we could arrange right away was negative the technician told me this was not a ruptured Baker's cyst. We attempted to get this aspirated by under ultrasound guidance in interventional radiology however all they did was an ultrasound however it shows an extensive fluid collection 62 x 8 x 9.4 in the left thigh and left calf. The patient states he thinks this started 8 days ago or so but he really is not complaining of any pain, fever or systemic symptoms. He has not ha 12/20; after some difficulty I managed to get the patient into see Dr. Donzetta Matters. Eventually he was taken into the hospital and had a drain put in the fluid collection below his left knee posteriorly extending into the posterior thigh.  He still has the drain in place. Culture of this showed moderate staff aureus few Morganella and few Klebsiella he is now on doxycycline and ciprofloxacin as suggested by infectious disease he is on this for a month. The drain will remain in place until it stops draining 12/29; he comes in today with the 1 wound on his left leg and the area on the left plantar first met head significantly smaller. Both look healthy. He still has the drain in the left leg. He says he has to change this daily. Follows up with Dr. Donzetta Matters on January 11. 06/29/2021; the wounds that I am following on the left leg and left first met head continued to be quite healthy. However the area where his inferior drain is in place had copious amounts of drainage which was green in color. The wound here is larger. Follows up with Dr. Gwenlyn Saran of vein and vascular his surgeon next week as well as infectious disease. He remains on ciprofloxacin and doxycycline. He is not complaining of excessive pain in either one of the drain areas 1/12; the patient saw  vascular surgery and infectious disease. Vascular surgery has left the drain in place as there was still some notable drainage still see him back in 2 weeks. Dr. Velna Ochs stop the doxycycline and ciprofloxacin and I do not believe he follows up with them at this point. Culture I did last week showed both doxycycline resistant MRSA and Pseudomonas not sensitive to ciprofloxacin although only in rare titers 1/19; the patient's wound on the left anterior lower leg is just about healed. We have continued healing of the area that was medially on the left leg. Left first plantar metatarsal head continues to get smaller. The major problem here is his 2 drain sites 1 on the left upper calf and lateral thigh. There is purulent drainage still from the left lateral thigh. I gave him antibiotics last week but we still have recultured. He has the drain in the area I think this is eventually going to have to come out. I suspect there will be a connecting wound to heal here perhaps with improved VAc 1/26; the patient had his drain removed by vein and vascular on 1/25/. This was a large pocket of fluid in his left thigh that seem to tunnel into his left upper calf. 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 His mention his Penrose drain was removed today. He now has a tunneling wound on his left calf and left thigh. Both of these probe widely towards each other although I cannot really prove that they connect. Both wounds on his lower leg anteriorly are closed and his area over the first metatarsal head on his right foot continues to improve. We are using Hydrofera Blue here. He also saw infectious disease culture of the abscess they noted was polymicrobial with MRSA, Morganella and Klebsiella he was treated with doxycycline and ciprofloxacin for 4 weeks ending on 07/03/2021. They did not recommend any further antibiotics. Notable that while he still had the Penrose drain in place last week he had  purulent drainage coming out of the inferior IandD site this grew [facility], MRSA and Pseudomonas but there does not appear to be any active infection in this area today with the drain out and he is not systemically unwell 2/2; with regards to the drain sites the superior one on the thigh actually is closed down the one on the upper left lateral calf measures about 8  and half centimeters which is an improvement seems to be less prominent although still with a lot of drainage. The only remaining wound is over the first metatarsal head on the left foot and this looks to be continuing to improve with Hydrofera Blue. 2/9; the area on his plantar left foot continues to contract. Callus around the wound edge. The drain sites specifically have not come down in depth. We put the wound VAC on Monday he changed the canister late last night our intake nurse reported a pocket of fluid perhaps caused by our compression wraps 2/16; continued improvement in left foot plantar wound. drainage site in the calf is not improved in terms of depth (wound vac) 2/23; continued improvement in the left foot wound over the first metatarsal head. With regards to the drain sites the area on his thigh laterally is healed however the open area on his calf is small in terms of circumference by still probes in by about 15 cm. Within using the wound VAC. Hydrofera Blue on his foot Objective Constitutional Sitting or standing Blood Pressure is within target range for patient.. Pulse regular and within target range for patient.Marland Kitchen Respirations regular, non-labored and within target range.. Temperature is normal and within the target range for the patient.Marland Kitchen Appears in no distress. Vitals Time Taken: 1:54 PM, Height: 74 in, Weight: 238 lbs, BMI: 30.6, Temperature: 98.6 F, Pulse: 68 bpm, Respiratory Rate: 20 breaths/min, Blood Pressure: 134/73 mmHg. General Notes: would exam; the area over the plantar first metatarsal head  continues to be smaller in healthy granulation. We're using Hydrofera Blue in this area no debridement is necessary. ooThat all from his JP drain on the left lateral calf still probes up 15 cm but the overall circumference/diameter of this area is a lot smaller. Integumentary (Hair, Skin) Wound #18 status is Open. Original cause of wound was Gradually Appeared. The date acquired was: 08/23/2020. The wound has been in treatment 18 weeks. The wound is located on the Left,Plantar Metatarsal head first. The wound measures 1.2cm length x 1.5cm width x 0.1cm depth; 1.414cm^2 area and 0.141cm^3 volume. There is Fat Layer (Subcutaneous Tissue) exposed. There is no tunneling or undermining noted. There is a medium amount of serosanguineous drainage noted. The wound margin is distinct with the outline attached to the wound base. There is large (67-100%) red granulation within the wound bed. There is a small (1-33%) amount of necrotic tissue within the wound bed including Eschar and Adherent Slough. Wound #22 status is Open. Original cause of wound was Bump. The date acquired was: 06/03/2021. The wound has been in treatment 10 weeks. The wound is located on the Left,Lateral Lower Leg. The wound measures 2cm length x 1cm width x 0.8cm depth; 1.571cm^2 area and 1.257cm^3 volume. There is Fat Layer (Subcutaneous Tissue) exposed. There is tunneling at 12:00 with a maximum distance of 15cm. There is a large amount of sanguinous drainage noted. There is large (67-100%) red, friable granulation within the wound bed. There is no necrotic tissue within the wound bed. 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 There was a Four Layer Compression Therapy Procedure by Dellie Catholic, RN. Post procedure Diagnosis Wound #22: Same as Pre-Procedure Plan Follow-up Appointments: Return Appointment in 1 week. - Dr Fredrich Cory/Dr Celine Ahr on Thursday room 2 Nurse Visit: - Monday for [employer] change Bathing/ Shower/ Hygiene: May shower with protection but do not get wound dressing(s) wet. - Use a cast protector so you can shower without getting your wrap(s) wet Negative Presssure Wound Therapy: Wound #22 Left,Lateral Lower Leg: Wound Vac to wound continuously at 176mm/hg pressure - change 2 times per week in clinic Black Foam Sterile Gauze Packing - in tunnel Edema Control - Lymphedema / SCD / Other: Elevate legs to the level of the heart or above for 30 minutes daily and/or when sitting, a frequency of: - throughout the day Avoid standing for long periods of time. Patient to wear own compression stockings every day. - on right leg; Moisturize legs daily. - Ammonium LACTATE to BLE every day. Off-Loading: Wedge shoe to: - left front foot offloader WOUND #18: - Metatarsal head first Wound Laterality: Plantar, Left Cleanser: Soap and Water 2 x Per Week/7 Days Discharge Instructions: May shower and wash wound with dial antibacterial soap and water prior to dressing change. Cleanser: Wound Cleanser 2 x Per Week/7 Days Discharge Instructions: Cleanse the wound with wound cleanser prior to applying a clean dressing using gauze sponges, not tissue or cotton balls. Peri-Wound Care: Triamcinolone 15 (g) 2 x Per Week/7 Days Discharge Instructions: Use triamcinolone 15 (g) as directed Peri-Wound Care: Sween Lotion (Moisturizing lotion) 2 x Per Week/7  Days Discharge Instructions: Apply moisturizing lotion as directed Prim Dressing: Hydrofera Blue Ready Foam, 2.5 x2.5 in 2 x Per Week/7 Days ary Discharge Instructions: Apply to wound bed as instructed Secondary Dressing: ABD Pad, 5x9 2 x Per Week/7 Days Discharge Instructions: Apply over primary dressing as directed. Com pression Wrap: CoFlex TLC XL 2-layer Compression System 4x7 (in/yd) 2 x Per Week/7 Days Discharge Instructions: Apply CoFlex 2-layer compression as directed. (alt for 4 layer) WOUND #22: - Lower Leg Wound Laterality: Left, Lateral Cleanser: Soap and Water 2 x Per Week/7 Days Discharge Instructions: May shower and wash wound with dial antibacterial soap and water prior to dressing change. Cleanser: Wound Cleanser 2 x Per Week/7 Days Discharge Instructions: Cleanse the wound with wound cleanser prior to applying a clean dressing using gauze sponges, not tissue or cotton balls. Peri-Wound Care: Triamcinolone 15 (g) 2 x Per Week/7 Days Discharge Instructions: Use triamcinolone 15 (g) as directed Peri-Wound Care: Sween Lotion (Moisturizing lotion) 2 x Per Week/7 Days Discharge Instructions: Apply moisturizing lotion as directed Prim Dressing: VAC 2 x Per Week/7 Days ary Com pression Wrap: CoFlex TLC XL 2-layer Compression System 4x7 (in/yd) 2 x Per Week/7 Days Discharge Instructions: Apply CoFlex 2-layer compression as directed. (alt for 4 layer) #1 I'm still using the wound VAC admitted hoping that the depth of the tunnel would come down. Today it is definitively 15 cm. I think the circumference and diameter of this tunnel have come down but not the overall depth #2 his left foot wound looks quite healthy. Using Hydrofera Blue and offloading Electronic Signature(s) Signed: 08/17/2021 5:53:09 PM By: Levan Hurst RN, BSN Signed: 08/22/2021 4:13:40 PM By: Linton Ham MD Previous Signature: 08/17/2021 5:03:55 PM Version By: Linton Ham MD Entered By: Levan Hurst on  08/17/2021 17:50:40 -------------------------------------------------------------------------------- SuperBill Details Patient Name: Date of Service: [name]. 08/17/2021 Medical  Record Number: [id number] Patient Account Number: [id number] Date of Birth/Sex: Treating RN: October 10, 1950 (71 y.o. [name] Primary Care Provider: Jilda [name] Other Clinician: Referring Provider: Treating Provider/Extender: [name] in Treatment: 18 Diagnosis Coding ICD-10 Codes Code Description E11.621 Type 2 diabetes mellitus with foot ulcer 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 I89.0 Lymphedema, not elsewhere classified I87.322 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verity 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 Facility Procedures CPT4 Code: 00415930 Description: 12379 - WOUND VAC-50 SQ CM OR LESS Modifier: Quantity: 1 CPT4 Code: 90940005 Description: (Facility Use Only) 29581LT - APPLY Jarrell LWR LT LEG Modifier: Quantity: 1 Physician Procedures : CPT4 Code Description Modifier [id number] 33882 - WC PHYS LEVEL 3 - EST PT ICD-10 Diagnosis Description 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verity 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 of other part of  left foot with other specified severity Quantity: 1 Electronic Signature(s) Signed: 08/17/2021 5:54:00 PM By: Dellie Catholic RN Signed: 08/22/2021 4:13:40 PM By: Linton Ham MD Previous Signature: 08/17/2021 5:03:55 PM Version By: Linton Ham MD Entered By: Dellie Catholic on 08/17/2021 17:52:47

## 2021-08-22 NOTE — Progress Notes (Signed)
[name], [name] ([id number]) Visit Report for 08/21/2021 Arrival Information Details Patient Name: Date of Service: [name], [name] 08/21/2021 10:00 A M Medical Record Number: [id number] Patient Account Number: [id number] Date of Birth/Sex: Treating RN: 1950/10/24 (71 y.o. M) Primary Care Noelene Gang: [name] Other Clinician: Referring Ferrell Flam: Treating Tiane Szydlowski/Extender: [name] in Treatment: 18 Visit Information History Since Last Visit Added or deleted any medications: No Patient Arrived: Ambulatory Any new allergies or adverse reactions: No Arrival Time: 10:36 Had a fall or experienced change in No Accompanied By: self activities of daily living that may affect Transfer Assistance: None risk of falls: Patient Identification Verified: Yes Signs or symptoms of abuse/neglect since last visito No Secondary Verification Process Completed: Yes Hospitalized since last visit: No Patient Requires Transmission-Based Precautions: No Implantable device outside of the clinic excluding No Patient Has Alerts: Yes cellular tissue based products placed in the center Patient Alerts: ABI's: 09/22 L:1.09 R: 1. since last visit: TBI's: R:0.72 L:0.99 Has Dressing in Place as Prescribed: Yes Pain Present Now: No Electronic Signature(s) Signed: 08/22/2021 7:53:36 AM By: Sandre Kitty Entered By: Sandre Kitty on 08/21/2021 10:41:29 -------------------------------------------------------------------------------- Compression Therapy Details Patient Name: Date of Service: [name] Garfinkel. 08/21/2021 10:00 A M Medical Record Number: [id number] Patient Account Number: [id number] Date of Birth/Sex: Treating RN: [birth year] (71 y.o. [name] Primary Care Haile Toppins: [name] Other Clinician: Referring Jyren Cerasoli: Treating Ladeidra Borys/Extender: [name] in Treatment: 18 Compression Therapy Performed for Wound Assessment: Wound #22  Left,Lateral Lower Leg Performed By: Clinician Baruch Gouty, RN Compression Type: Double Layer Electronic Signature(s) Signed: 08/21/2021 4:54:19 PM By: Baruch Gouty RN, BSN Entered By: Baruch Gouty on 08/21/2021 11:15:01 -------------------------------------------------------------------------------- Encounter Discharge Information Details Patient Name: Date of Service: [name] Garfinkel. 08/21/2021 10:00 A M Medical Record Number: [id number] Patient Account Number: [id number] Date of Birth/Sex: Treating RN: Jan 06, 1951 (71 y.o. [name] Primary Care Maelie Chriswell: Other Clinician: [name] Referring Abdishakur Gottschall: Treating Alechia Lezama/Extender: [name] in Treatment: 18 Encounter Discharge Information Items Discharge Condition: Stable Ambulatory Status: Ambulatory Discharge Destination: Home Transportation: Private Auto Accompanied By: self Schedule Follow-up Appointment: Yes Clinical Summary of Care: Patient Declined Electronic Signature(s) Signed: 08/21/2021 4:54:19 PM By: Baruch Gouty RN, BSN Entered By: Baruch Gouty on 08/21/2021 11:17:27 -------------------------------------------------------------------------------- Negative Pressure Wound Therapy Maintenance (NPWT) Details Patient Name: Date of Service: [name], [name] 08/21/2021 10:00 A M Medical Record Number: [id number] Patient Account Number: [id number] Date of Birth/Sex: Treating RN: Oct 15, 1950 (71 y.o. [name] Primary Care Daymion Nazaire: [name] Other Clinician: Referring Taiz Bickle: Treating Deric Bocock/Extender: [name] in Treatment: 18 NPWT Maintenance Performed for: Wound #22 Left, Lateral Lower Leg Additional Injuries Covered: No Performed By: Baruch Gouty, RN Type: VAC System Coverage Size (sq cm): 2 Pressure Type: Constant Pressure Setting: 125 mmHG Drain Type: None Sponge/Dressing Type: Gauze Date Initiated:  07/27/2021 Dressing Removed: No Quantity of Sponges/Gauze Removed: 1 Canister Changed: No Dressing Reapplied: No Quantity of Sponges/Gauze Inserted: 1 Respones T Treatment: o good Days On NPWT : 26 Electronic Signature(s) Signed: 08/21/2021 4:54:19 PM By: Baruch Gouty RN, BSN Entered By: Baruch Gouty on 08/21/2021 11:14:41 -------------------------------------------------------------------------------- Patient/Caregiver Education Details Patient Name: Date of Service: [name], [name] 2/27/2023andnbsp10:00 [place] Record Number: [id number] Patient Account Number: [id number] Date of Birth/Gender: Treating RN: 1951/04/05 (71 y.o. [name] Primary Care Physician: [name] Other Clinician: Referring Physician: Treating Physician/Extender: [name] in Treatment: 18 Education  Assessment Education Provided To: Patient Education Topics Provided Venous: Methods: Explain/Verbal Responses: Reinforcements needed, State content correctly Electronic Signature(s) Signed: 08/21/2021 4:54:19 PM By: Baruch Gouty RN, BSN Entered By: Baruch Gouty on 08/21/2021 11:17:06 -------------------------------------------------------------------------------- Wound Assessment Details Patient Name: Date of Service: [name] Garfinkel. 08/21/2021 10:00 A M Medical Record Number: [id number] Patient Account Number: [id number] Date of Birth/Sex: Treating RN: September 23, 1950 (71 y.o. M) Primary Care Della Scrivener: [name] Other Clinician: Referring Ursula Dermody: Treating Cypher Paule/Extender: [name] in Treatment: 18 Wound Status Wound Number: 18 Primary Etiology: Diabetic Wound/Ulcer of the Lower Extremity Wound Location: Left, Plantar Metatarsal head first Wound Status: Open Wounding Event: Gradually Appeared Date Acquired: 08/23/2020 Weeks Of Treatment: 18 Clustered Wound: No Wound Measurements Length: (cm) 1.2 Width: (cm)  1.5 Depth: (cm) 0.1 Area: (cm) 1.414 Volume: (cm) 0.141 % Reduction in Area: 90% % Reduction in Volume: 95% Wound Description Classification: Grade 2 Exudate Amount: Medium Exudate Type: Serosanguineous Exudate Color: red, brown Treatment Notes Wound #18 (Metatarsal head first) Wound Laterality: Plantar, Left Cleanser Soap and Water Discharge Instruction: May shower and wash wound with dial antibacterial soap and water prior to dressing change. Wound Cleanser Discharge Instruction: Cleanse the wound with wound cleanser prior to applying a clean dressing using gauze sponges, not tissue or cotton balls. Peri-Wound Care Triamcinolone 15 (g) Discharge Instruction: Use triamcinolone 15 (g) as directed Sween Lotion (Moisturizing lotion) Discharge Instruction: Apply moisturizing lotion as directed Topical Primary Dressing Hydrofera Blue Ready Foam, 2.5 x2.5 in Discharge Instruction: Apply to wound bed as instructed Secondary Dressing ABD Pad, 5x9 Discharge Instruction: Apply over primary dressing as directed. Secured With Compression Wrap CoFlex TLC XL 2-layer Compression System 4x7 (in/yd) Discharge Instruction: Apply CoFlex 2-layer compression as directed. (alt for 4 layer) Compression Stockings Add-Ons Electronic Signature(s) Signed: 08/22/2021 7:53:36 AM By: Sandre Kitty Entered By: Sandre Kitty on 08/21/2021 10:42:07 -------------------------------------------------------------------------------- Wound Assessment Details Patient Name: Date of Service: [name], [name] 08/21/2021 10:00 A M Medical Record Number: [id number] Patient Account Number: [id number] Date of Birth/Sex: Treating RN: August 14, 1950 (71 y.o. M) Primary Care Jakayla Schweppe: [name] Other Clinician: Referring Demar Shad: Treating Nguyen Butler/Extender: [name] in Treatment: 18 Wound Status Wound Number: 22 Primary Etiology: Cyst Wound Location: Left, Lateral Lower  Leg Wound Status: Open Wounding Event: Bump Date Acquired: 06/03/2021 Weeks Of Treatment: 11 Clustered Wound: No Wound Measurements Length: (cm) 2 Width: (cm) 1 Depth: (cm) 0.8 Area: (cm) 1.571 Volume: (cm) 1.257 % Reduction in Area: 4.7% % Reduction in Volume: 4.7% Wound Description Classification: Full Thickness With Exposed Support Structure Exudate Amount: Large Exudate Type: Sanguinous Exudate Color: red s Treatment Notes Wound #22 (Lower Leg) Wound Laterality: Left, Lateral Cleanser Soap and Water Discharge Instruction: May shower and wash wound with dial antibacterial soap and water prior to dressing change. Wound Cleanser Discharge Instruction: Cleanse the wound with wound cleanser prior to applying a clean dressing using gauze sponges, not tissue or cotton balls. Peri-Wound Care Triamcinolone 15 (g) Discharge Instruction: Use triamcinolone 15 (g) as directed Sween Lotion (Moisturizing lotion) Discharge Instruction: Apply moisturizing lotion as directed Topical Primary Dressing VAC Secondary Dressing Secured With Compression Wrap CoFlex TLC XL 2-layer Compression System 4x7 (in/yd) Discharge Instruction: Apply CoFlex 2-layer compression as directed. (alt for 4 layer) Compression Stockings Add-Ons Electronic Signature(s) Signed: 08/22/2021 7:53:36 AM By: Sandre Kitty Entered By: Sandre Kitty on 08/21/2021 10:42:07 -------------------------------------------------------------------------------- Vitals Details Patient Name: Date of Service: [name] Garfinkel. 08/21/2021 10:00 A M Medical Record Number: [id number]  Patient Account Number: [id number] Date of Birth/Sex: Treating RN: 03/25/1951 (71 y.o. M) Primary Care Jackson Fetters: [name] Other Clinician: Referring Sagan Maselli: Treating Ojas Coone/Extender: [name] in Treatment: 18 Vital Signs Time Taken: 10:41 Temperature (F): 98.4 Height (in): 74 Pulse (bpm):  61 Weight (lbs): 238 Respiratory Rate (breaths/min): 20 Body Mass Index (BMI): 30.6 Blood Pressure (mmHg): 168/94 Reference Range: 80 - 120 mg / dl Electronic Signature(s) Signed: 08/22/2021 7:53:36 AM By: Sandre Kitty Entered By: Sandre Kitty on 08/21/2021 10:41:48

## 2021-08-24 ENCOUNTER — Encounter (HOSPITAL_BASED_OUTPATIENT_CLINIC_OR_DEPARTMENT_OTHER): Payer: Medicare Other | Attending: General Surgery | Admitting: General Surgery

## 2021-08-24 ENCOUNTER — Other Ambulatory Visit: Payer: Self-pay

## 2021-08-24 DIAGNOSIS — Z794 Long term (current) use of insulin: Secondary | ICD-10-CM | POA: Diagnosis not present

## 2021-08-24 DIAGNOSIS — E11621 Type 2 diabetes mellitus with foot ulcer: Secondary | ICD-10-CM | POA: Insufficient documentation

## 2021-08-24 DIAGNOSIS — L97828 Non-pressure chronic ulcer of other part of left lower leg with other specified severity: Secondary | ICD-10-CM | POA: Diagnosis not present

## 2021-08-24 DIAGNOSIS — N183 Chronic kidney disease, stage 3 unspecified: Secondary | ICD-10-CM | POA: Insufficient documentation

## 2021-08-24 DIAGNOSIS — I129 Hypertensive chronic kidney disease with stage 1 through stage 4 chronic kidney disease, or unspecified chronic kidney disease: Secondary | ICD-10-CM | POA: Insufficient documentation

## 2021-08-24 DIAGNOSIS — I87322 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: Secondary | ICD-10-CM | POA: Insufficient documentation

## 2021-08-24 DIAGNOSIS — I890 Lymphedema, not elsewhere classified: Secondary | ICD-10-CM | POA: Insufficient documentation

## 2021-08-24 DIAGNOSIS — L97528 Non-pressure chronic ulcer of other part of left foot with other specified severity: Secondary | ICD-10-CM | POA: Insufficient documentation

## 2021-08-24 DIAGNOSIS — E1122 Type 2 diabetes mellitus with diabetic chronic kidney disease: Secondary | ICD-10-CM | POA: Diagnosis not present

## 2021-08-24 DIAGNOSIS — L97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: Secondary | ICD-10-CM | POA: Diagnosis not present

## 2021-08-24 DIAGNOSIS — E1151 Type 2 diabetes mellitus with diabetic peripheral angiopathy without gangrene: Secondary | ICD-10-CM | POA: Insufficient documentation

## 2021-08-24 NOTE — Progress Notes (Signed)
[name], [name] ([id number]) Visit Report for 08/24/2021 Chief Complaint Document Details Patient Name: Date of Service: [name], [name] 08/24/2021 8:45 A M Medical Record Number: [id number] Patient Account Number: [id number] Date of Birth/Sex: Treating RN: [birth year] (71 y.o. M) Primary Care Provider: [name] Other Clinician: Referring Provider: Treating Provider/Extender: [name] in Treatment: 19 Information Obtained from: Patient Chief Complaint Left leg and foot ulcers 04/12/2021; patient is here for wounds on his left lower leg and left plantar foot over the first metatarsal head Electronic Signature(s) Signed: 08/24/2021 9:28:39 AM By: Fredirick Maudlin MD FACS Entered By: Fredirick Maudlin on 08/24/2021 09:28:39 -------------------------------------------------------------------------------- Debridement Details Patient Name: Date of Service: [name]. 08/24/2021 8:45 A M Medical Record Number: [id number] Patient Account Number: [id number] Date of Birth/Sex: Treating RN: [birth year] (71 y.o. [name] Primary Care Provider: [name] Other Clinician: Referring Provider: Treating Provider/Extender: [name] in Treatment: 19 Debridement Performed for Assessment: Wound #18 Left,Plantar Metatarsal head first Performed By: Physician Fredirick Maudlin, MD Debridement Type: Debridement Severity of Tissue Pre Debridement: Fat layer exposed Level of Consciousness (Pre-procedure): Awake and Alert Pre-procedure Verification/Time Out Yes - 09:20 Taken: Start Time: 09:20 T Area Debrided (L x W): otal 1 (cm) x 1.4 (cm) = 1.4 (cm) Tissue and other material debrided: Non-Viable, Callus Level: Non-Viable Tissue Debridement Description: Selective/Open Wound Instrument: Curette Bleeding: Minimum Hemostasis Achieved: Pressure End Time: 09:23 Procedural Pain: 0 Post Procedural Pain: 0 Response to Treatment: Procedure  was tolerated well Level of Consciousness (Post- Awake and Alert procedure): Post Debridement Measurements of Total Wound Length: (cm) 1 Width: (cm) 1.4 Depth: (cm) 0.1 Volume: (cm) 0.11 Character of Wound/Ulcer Post Debridement: Improved Severity of Tissue Post Debridement: Fat layer exposed Post Procedure Diagnosis Same as Pre-procedure Electronic Signature(s) Signed: 08/24/2021 9:49:37 AM By: Fredirick Maudlin MD FACS Signed: 08/24/2021 5:54:42 PM By: Levan Hurst RN, BSN Entered By: Levan Hurst on 08/24/2021 09:23:55 -------------------------------------------------------------------------------- HPI Details Patient Name: Date of Service: [name]. 08/24/2021 8:45 A M Medical Record Number: [id number] Patient Account Number: [id number] Date of Birth/Sex: Treating RN: 19-Dec-1950 (71 y.o. M) Primary Care Provider: [name] Other Clinician: Referring Provider: Treating Provider/Extender: [name] in Treatment: 19 History of Present Illness HPI Description: 10/11/17; Mr. Raz is a 71 year old man who tells me that in 2015 he slipped down the latter traumatizing his left leg. He developed a wound in the same spot the area that we are currently looking at. He states this closed over for the most part although he always felt it was somewhat unstable. In 2016 he hit the same area with the door of his car had this reopened. He tells me that this is never really closed although sometimes an inflow it remains open on a constant basis. He has not been using any specific dressing to this except for topical antibiotics the nature of which were not really sure. His primary doctor did send him to see Dr. [name] of interventional cardiology. He underwent an angiogram on 08/06/17 and he underwent a PTA and directional atherectomy of the lesser distal SFA and popliteal arteries which resulted in brisk improvement in blood flow. It was noted that he had 2 vessel  runoff through the anterior tibial and peroneal. He is also been to see vascular and interventional radiologist. He was not felt to have any significant superficial venous insufficiency. Presumably is not a candidate for any ablation. It was suggested he come here  for wound care. The patient is a type II diabetic on insulin. He also has a history of venous insufficiency. ABIs on the left were noncompressible in our clinic 10/21/17; patient we admitted to the clinic last week. 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 We put Iodosorb on him after an aggressive debridement and 3 layer compression. He complained of pain in his ankle and itching with is skin in fact he scratched the area on the medial calf superiorly at the rim of our wraps and he has 2 small open areas in that location today which are new. I changed his primary dressing today to silver collagen. As noted he is already had revascularization and does not have any significant superficial venous insufficiency that would be amenable to ablation 10/28/17; patient admitted to the clinic 2 weeks ago. He has a smaller Wound. Scratch injury from last week revealed. There is large wound over the tibial area. This is smaller. Granulation looks healthy. No need for debridement. 11/04/17; the wound on the left lateral calf looks better. Improved dimensions. Surface of this looks better. We've been maintaining him and Kerlix Coban wraps. He finds this much more comfortable. Silver collagen dressing 11/11/17; left lateral Wound continues to look healthy be making progress. Using a #5 curet I removed removed nonviable skin from the surface of the wound and then necrotic debris from the wound surface. Surface of the wound continues to look healthy. He also has an open area on the left great toenail bed. We've been using topical antibiotics. 11/19/17; left anterior lateral wound continues to  look healthy but it's not closed. He also had a small wound above this on the left leg Initially traumatic wounds in the setting of significant chronic venous insufficiency and stasis dermatitis 11/25/17; left anterior wounds superiorly is closed still a small wound inferiorly. 12/02/17; left anterior tibial area. Arrives today with adherent callus. Post debridement clearly not completely closed. Hydrofera Blue under 3 layer compression. 12/09/17; left anterior tibia. Circumferential eschar however the wound bed looks stable to improved. We've been using Hydrofera Blue under 3 layer compression 12/17/17; left anterior tibia. Apparently this was felt to be closed however when the wrap was taken off there is a skin tear to reopen wounds in the same area we've been using Hydrofera Blue under 3 layer compression 12/23/17 left anterior tibia. Not close to close this week apparently the [facility] was stuck to this again. Still circumferential eschar requiring debridement. I put a contact layer on this this time under the Hydrofera Blue 12/31/17; left anterior tibia. Wound is better slight amount of hyper-granulation. Using Hydrofera Blue over Adaptic. 01/07/18; left anterior tibia. The wound had some surface eschar however after this was removed he has no open wound.he was already revascularized by Dr. [name] when he came to our clinic with atherectomy of the left SFA and popliteal artery. He was also sent to interventional radiology for venous reflux studies. He was not felt to have significant reflux but certainly has chronic venous changes of his skin with hemosiderin deposition around this area. He will definitely need to lubricate his skin and wear compression stocking and I've talked to him about this. READMISSION 05/26/2018 This is a now 71 year old man we cared for with traumatic wounds on his left anterior lower extremity. He had been previously revascularized during that admission by Dr. [name].  Apparently in follow-up Dr. [name] noted that he had deterioration  in his arterial status. He underwent a stent placement in the distal left SFA on 04/22/2018. 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 He went back to the angiography suite on 04/30/2018 he underwent PTA and balloon angioplasty of the occluded left mid anterior tibial artery, thrombotic occlusion went from 100 to 0% which reconstitutes the posterior tibial artery. He had thrombectomy and aspiration of the peroneal artery. The stent placed in the distal SFA left SFA was still occluded. 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 He has been recuperating at [facility] in [facility] after the most recent hospitalization. He is going home tomorrow. He tells me that 2 weeks ago he traumatized the tip of his left fifth toe. He came in urgently for our review of this. This was a history of before I noted that Dr. [name] had already noted dry gangrenous changes of the left fifth toe 06/09/2018; 2-week follow-up. I did contact Dr. [name] after his last appointment and he apparently saw 1 of Dr. Irven Shelling colleagues the next day. He does not follow-up with Dr. [name] himself until Thursday of this week. 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 Nevertheless there is no evidence of infection no drainage and no pain. He had a new area that this week when we were signing him in today on the left anterior mid tibia area, this is in close proximity to the previous wound we have dealt with in this clinic. 06/23/2018; 2-week follow-up. I did not receive a recent note from Dr. [name] to review today. Our office is trying to obtain this. He is apparently not planning  to do further vascular interventions and wondered about compression to try and help with the patient's chronic venous insufficiency. However we are also concerned about the arterial flow. He arrives in clinic today with a new area on the left third toe. The areas on the calf/anterior tibia are close to closing. The left fifth toe is still mummified using Betadine. -In reviewing things with the patient he has what sounds like claudication with mild to moderate amount of activity. 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 I prescribed Levaquin over the phone while we attempted to arrange a plan of care. However the patient called yesterday to report he had low-grade fever and he came in today acutely. There is been a marked deterioration in the left third toe with spreading cellulitis up into the dorsal left foot. He was referred to the emergency room. Readmission: 06/29/2020 patient presents today for reevaluation here in our clinic he was previously treated by Dr. Dellia Nims at the latter part of 2019 in 2 the beginning of 2020. Subsequently we have not seen him since that time in the interim he did have evaluation with vein and vascular specialist specifically Dr. Anice Paganini who did perform quite extensive work for a left femoral to anterior tibial artery bypass. With that being said in the interim the patient has developed significant lymphedema and has wounds that he tells me have really never healed in regard to the incision site on the left leg. He also has multiple wounds on the feet for various reasons some of which is  that he tends to pick at his feet. Fortunately there is no signs of active infection systemically at this time he does have some wounds that are little bit deeper but most are fairly superficial he seems to have good blood flow and overall everything appears to be healthy I see no bone exposed and no obvious signs of osteomyelitis. I do not know that  he necessarily needs a x-ray at this point although that something we could consider depending on how things progress. The patient does have a history of lymphedema, diabetes, this is type II, chronic kidney disease stage III, hypertension, and history of peripheral vascular disease. 07/05/2020; patient admitted last week. Is a patient I remember from 2019 he had a spreading infection involving the left foot and we sent him to the hospital. 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 He subsequently had a left femoral to anterior tibial bypass by Dr.Cain vein and vascular. He also has severe lymphedema with chronic skin changes related to that on the left leg. The most problematic area that was new today was on the left medial great toe. This was apparently a small area last week there was purulent drainage which our intake nurse cultured. Also areas on the left medial foot and heel left lateral foot. He has 2 areas on the left medial calf left lateral calf in the setting of the severe lymphedema. 07/13/2020 on evaluation today patient appears to be doing better in my opinion compared to his last visit. The good news is there is no signs of active infection systemically and locally I do not see any signs of infection either. He did have an x-ray which was negative that is great news he had a culture which showed MRSA but at the same time he is been on the doxycycline which has helped. I do think we may want to extend this for 7 additional days 1/25; patient admitted to the clinic a few weeks ago. 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 We have been putting him under compression his edema control is a lot better but he is severe verricused skin on the left leg. He is really done quite well he still has an open area on the left medial calf and the left medial first metatarsal head. We have been using silver collagen on  the leg silver alginate on the foot 07/27/2020 upon evaluation today patient appears to be doing decently well in regard to his wounds. He still has a lot of dry skin on the left leg. Some of this is starting to peel back and I think he may be able to have them out by removing some that today. Fortunately there is no signs of active infection at this time on the left leg although on the right leg he does appear to have swelling and erythema as well as some mild warmth to touch. This does have been concerned about the possibility of cellulitis although within the differential diagnosis I do think that potentially a DVT has to be at least considered. We need to rule that out before proceeding would just call in the cellulitis. Especially since he is having pain in the posterior aspect of his calf muscle. 2/8; the patient had seen sparingly. He has severe skin changes of chronic lymphedema in the left leg thickened hyperkeratotic verrucous skin. He has an open wound on the medial part of the left  first met head left mid tibia. He also has a rim of nonepithelialized skin in the anterior mid tibia. He brought in the AmLactin lotion that was been prescribed although I am not sure under compression and its utility. There concern about cellulitis on the right lower leg the last time he was here. He was put on on antibiotics. His DVT rule out was negative. The right leg looks fine he is using his stocking on this area 08/10/2020 upon evaluation today patient appears to be doing well with regard to his leg currently. He has been tolerating the dressing changes without complication. Fortunately there is no signs of active infection which is great news. Overall very pleased with where things stand. 2/22; the patient still has an area on the medial part of the left first met his head. This looks better than when I last saw this earlier this month he has a rim of epithelialization but still some surface debris. Mostly  everything on the left leg is healed. There is still a vulnerable in the left mid tibia area. 08/30/2020 upon evaluation today patient appears to be doing much better in regard to his wounds on his foot. 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 Nonetheless I think we will address that today I Minna send in a prescription for him in that regard. Overall though there does not appear to be any signs of significant worsening. 09/07/2020 on evaluation today patient's wounds over his left foot appear to be doing excellent. I do not see any signs of infection there is some callus buildup this can require debridement for certain but overall I feel like he is managing quite nicely. He still using the AmLactin cream which has been beneficial for him as well. 3/22; left foot wound is closed. There is no open area here. He is using ammonium lactate lotion to the lower extremities to help exfoliate dry cracked skin. He has compression stockings from elastic therapy in [place]. The wound on the medial part of his left first met head is healed today. READMISSION 04/12/2021 Mr. Eden is a patient we know fairly well he had a prolonged stay in clinic in 2019 with wounds on his left lateral and left anterior lower extremity in the setting of chronic venous insufficiency. More recently he was here earlier this year with predominantly an area on his left foot first metatarsal head plantar and he says the plantar foot broke down on its not long after we discharged him but he did not come back here. The last few months areas of broken down on his left anterior and again the left lateral lower extremity. The leg itself is very swollen chronically enlarged a lot of hyperkeratotic dry Berry Q skin in the left lower leg. His edema extends well into the thigh. He was seen by Dr. Donzetta Matters. He had  ABIs on 03/02/2021 showing an ABI on the right of 1 with a TBI of 0.72 his ABI in the left at 1.09 TBI of 0.99. Monophasic and biphasic waveforms on the right. On the left monophasic waveforms were noted he went on to have an angiogram on 03/27/2021 this showed the aortic aortic and iliac segments were free of flow-limiting stenosis the left common femoral vein to evaluate the left femoral to anterior tibial artery bypass was unobstructed the bypass was patent without any areas of  stenosis. We discharged the patient in bilateral juxta lite stockings but very clearly that was not sufficient to control the swelling and maintain skin integrity. He is clearly going to need compression pumps. 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 This is fortunate because he is on his feet for long periods of time. 10/27; patient comes in with our intake nurse reporting copious amount of green drainage from the left anterior mid tibia the left dorsal foot and to a lesser extent the left medial mid tibia. We left the compression wrap on all week for the amount of edema in his left leg is quite a bit better. We use silver alginate as the primary dressing 11/3; edema control is good. Left anterior lower leg left medial lower leg and the plantar first metatarsal head. The left anterior lower leg required debridement. Deep tissue culture I did of this wound showed MRSA I put him on 10 days of doxycycline which she will start today. We have him in compression wraps. He has a security card and [employer] however he is retiring on November 15. We will need to then get him into a better offloading boot for the left foot perhaps a total contact cast 11/10; edema control is quite good. Left anterior and left medial lower leg wounds in the setting of chronic venous insufficiency and lymphedema. He also has a substantial area over the left plantar first metatarsal head. I treated him  for MRSA that we identified on the major wound on the left anterior mid tibia with doxycycline and gentamicin topically. He has significant hypergranulation on the left plantar foot wound. The patient is a diabetic but he does not have significant PAD 11/17; edema control is quite good. Left anterior and left medial lower leg wounds look better. The really concerning area remains the area on the left plantar first metatarsal head. He has a rim of epithelialization. 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 Starting today with a forefoot off loader but . possibly a total contact cast. 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 Arrives with a very significant odor from I think all of the wounds on the left leg and the left foot. Because of this I did not put a total contact cast on him today but will could still consider this. His wife was having cataract surgery which is the reason he missed the appointment 12/6. I saw this man 5 days ago with a swelling below the popliteal fossa. I thought he actually might have a Baker's cyst however the DVT rule out study that we could arrange right away was negative the technician told me this was not a ruptured Baker's cyst. We attempted to get this aspirated by under ultrasound guidance in interventional radiology however all they did was an ultrasound however it shows an extensive fluid collection 62 x 8 x 9.4 in the left thigh and left calf. The patient states he thinks this started 8 days ago or so but he really is not complaining of any pain, fever or systemic symptoms. He has not ha 12/20; after some difficulty I managed to get the patient into see  Dr. Donzetta Matters. Eventually he was taken into the hospital and had a drain put in the fluid  collection below his left knee posteriorly extending into the posterior thigh. He still has the drain in place. Culture of this showed moderate staff aureus few Morganella and few Klebsiella he is now on doxycycline and ciprofloxacin as suggested by infectious disease he is on this for a month. The drain will remain in place until it stops draining 12/29; he comes in today with the 1 wound on his left leg and the area on the left plantar first met head significantly smaller. Both look healthy. He still has the drain in the left leg. He says he has to change this daily. Follows up with Dr. Donzetta Matters on January 11. 06/29/2021; the wounds that I am following on the left leg and left first met head continued to be quite healthy. However the area where his inferior drain is in place had copious amounts of drainage which was green in color. The wound here is larger. Follows up with Dr. Gwenlyn Saran of vein and vascular his surgeon next week as well as infectious disease. He remains on ciprofloxacin and doxycycline. He is not complaining of excessive pain in either one of the drain areas 1/12; the patient saw vascular surgery and infectious disease. Vascular surgery has left the drain in place as there was still some notable drainage still see him back in 2 weeks. Dr. Velna Ochs stop the doxycycline and ciprofloxacin and I do not believe he follows up with them at this point. Culture I did last week showed both doxycycline resistant MRSA and Pseudomonas not sensitive to ciprofloxacin although only in rare titers 1/19; the patient's wound on the left anterior lower leg is just about healed. We have continued healing of the area that was medially on the left leg. Left first plantar metatarsal head continues to get smaller. The major problem here is his 2 drain sites 1 on the left upper calf and lateral thigh. There is purulent drainage still from the left lateral thigh. I gave him antibiotics last week but we still have  recultured. He has the drain in the area I think this is eventually going to have to come out. I suspect there will be a connecting wound to heal here perhaps with improved VAc 1/26; the patient had his drain removed by vein and vascular on 1/25/. This was a large pocket of fluid in his left thigh that seem to tunnel into his left upper calf. 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 His mention his Penrose drain was removed today. He now has a tunneling wound on his left calf and left thigh. Both of these probe widely towards each other although I cannot really prove that they connect. Both wounds on his lower leg anteriorly are closed and his area over the first metatarsal head on his right foot continues to improve. We are using Hydrofera Blue here. He also saw infectious disease culture of the abscess they noted was polymicrobial with MRSA, Morganella and Klebsiella he was treated with doxycycline and ciprofloxacin for 4 weeks ending on 07/03/2021. They did not recommend any further antibiotics. Notable that while he still had the Penrose drain in place last week he had purulent drainage coming out of the inferior IandD site this grew [facility], MRSA and Pseudomonas but there does not appear to be any active infection in this area today with the drain out and he is not systemically unwell  2/2; with regards to the drain sites the superior one on the thigh actually is closed down the one on the upper left lateral calf measures about 8 and half centimeters which is an improvement seems to be less prominent although still with a lot of drainage. The only remaining wound is over the first metatarsal head on the left foot and this looks to be continuing to improve with Hydrofera Blue. 2/9; the area on his plantar left foot continues to contract. Callus around the wound edge. The drain sites specifically have not come down in depth. We put the wound VAC on Monday he changed the  canister late last night our intake nurse reported a pocket of fluid perhaps caused by our compression wraps 2/16; continued improvement in left foot plantar wound. drainage site in the calf is not improved in terms of depth (wound vac) 2/23; continued improvement in the left foot wound over the first metatarsal head. With regards to the drain sites the area on his thigh laterally is healed however the open area on his calf is small in terms of circumference by still probes in by about 15 cm. Within using the wound VAC. Hydrofera Blue on his foot 08/24/2021: The left first metatarsal head wound continues to improve. The wound bed is healthy with just some surrounding callus. Unfortunately the open drain site on his calf remains open and tunnels at least 15 cm (the extent of a Q-tip). This is despite several weeks of wound VAC treatment. Based on reading back through the notes, there has been really no significant change in the depth of the wound, although the orifice is smaller and the more cranial wound on his thigh has closed. I suspect the tunnel tracks nearly all the way to this location. Electronic Signature(s) Signed: 08/24/2021 9:31:05 AM By: Fredirick Maudlin MD FACS Entered By: Fredirick Maudlin on 08/24/2021 09:31:05 -------------------------------------------------------------------------------- Physical Exam Details Patient Name: Date of Service: [name]. 08/24/2021 8:45 A M Medical Record Number: [id number] Patient Account Number: [id number] Date of Birth/Sex: Treating RN: 1950/09/19 (71 y.o. M) Primary Care Provider: [name] Other Clinician: Referring Provider: Treating Provider/Extender: [name] in Treatment: 19 Constitutional . . . No acute distress. Respiratory Normal work of breathing on room air. Notes 08/24/2021: Wound examthe area over the plantar first metatarsal head continues to close with good granulation. He does have some heavy  callus surrounding it which was debrided today with a curette. Unfortunately, the tunnel in his left calf is not really coming down at all in overall depth. The orifice is smaller, however. Electronic Signature(s) Signed: 08/24/2021 9:32:51 AM By: Fredirick Maudlin MD FACS Entered By: Fredirick Maudlin on 08/24/2021 09:32:51 -------------------------------------------------------------------------------- Physician Orders Details Patient Name: Date of Service: [name]. 08/24/2021 8:45 A M Medical Record Number: [id number] Patient Account Number: [id number] Date of Birth/Sex: Treating RN: Oct 15, 1950 (71 y.o. [name] Primary Care Provider: [name] Other Clinician: Referring Provider: Treating Provider/Extender: [name] in Treatment: 82 Verbal / Phone Orders: No Diagnosis Coding ICD-10 Coding Code Description E11.621 Type 2 diabetes mellitus with foot ulcer 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 I89.0 Lymphedema, not elsewhere classified I87.322 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verity 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  with other specified severity Follow-up Appointments ppointment in 1 week. - Dr Celine Ahr on Thursday Return A Nurse Visit: - Monday for [facility] change Bathing/ Shower/ Hygiene May shower with protection but do not get wound dressing(s) wet. - Use a cast protector so you can shower without getting your wrap(s) wet Negative Presssure Wound Therapy Wound #22 Left,Lateral Lower Leg Wound Vac to wound continuously at 138m/hg pressure - change 2 times per week in clinic Black Foam Sterile Gauze Packing - in tunnel Edema Control - Lymphedema / SCD /  Other Elevate legs to the level of the heart or above for 30 minutes daily and/or when sitting, a frequency of: - throughout the day Avoid standing for long periods of time. Patient to wear own compression stockings every day. - on right leg; Moisturize legs daily. - Ammonium LACTATE to BLE every day. Off-Loading Wedge shoe to: - left front foot offloader Wound Treatment Wound #18 - Metatarsal head first Wound Laterality: Plantar, Left Cleanser: Soap and Water 2 x Per Week/7 Days Discharge Instructions: May shower and wash wound with dial antibacterial soap and water prior to dressing change. Cleanser: Wound Cleanser 2 x Per Week/7 Days Discharge Instructions: Cleanse the wound with wound cleanser prior to applying a clean dressing using gauze sponges, not tissue or cotton balls. Peri-Wound Care: Triamcinolone 15 (g) 2 x Per Week/7 Days Discharge Instructions: Use triamcinolone 15 (g) as directed Peri-Wound Care: Sween Lotion (Moisturizing lotion) 2 x Per Week/7 Days Discharge Instructions: Apply moisturizing lotion as directed Prim Dressing: Hydrofera Blue Ready Foam, 2.5 x2.5 in 2 x Per Week/7 Days ary Discharge Instructions: Apply to wound bed as instructed Secondary Dressing: ABD Pad, 5x9 2 x Per Week/7 Days Discharge Instructions: Apply over primary dressing as directed. Compression Wrap: CoFlex TLC XL 2-layer Compression System 4x7 (in/yd) 2 x Per Week/7 Days Discharge Instructions: Apply CoFlex 2-layer compression as directed. (alt for 4 layer) Wound #22 - Lower Leg Wound Laterality: Left, Lateral Cleanser: Soap and Water 2 x Per Week/7 Days Discharge Instructions: May shower and wash wound with dial antibacterial soap and water prior to dressing change. Cleanser: Wound Cleanser 2 x Per Week/7 Days Discharge Instructions: Cleanse the wound with wound cleanser prior to applying a clean dressing using gauze sponges, not tissue or cotton balls. Peri-Wound Care: Triamcinolone 15 (g) 2  x Per Week/7 Days Discharge Instructions: Use triamcinolone 15 (g) as directed Peri-Wound Care: Sween Lotion (Moisturizing lotion) 2 x Per Week/7 Days Discharge Instructions: Apply moisturizing lotion as directed Compression Wrap: CoFlex TLC XL 2-layer Compression System 4x7 (in/yd) 2 x Per Week/7 Days Discharge Instructions: Apply CoFlex 2-layer compression as directed. (alt for 4 layer) [employer] [facility] Dr. CDonzetta Matters- Follow up regarding non healing IandD incision on left lower leg, 15 cm tunnel not closing with conventional treatment (wound vac), may possibly need surgical intervention to open incision to allow for healing - (ICD10 I87.322 - 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) Electronic Signature(s) Signed: 08/24/2021 9:49:37 AM By: CFredirick MaudlinMD FACS Entered By: CFredirick Maudlinon 08/24/2021 09:33:45 Prescription 08/24/2021 -------------------------------------------------------------------------------- LNita SickleMD Patient Name: Provider: [id number] of Birth: NPI#:Marice PotterSex: DEA #: [phone] #: License #: MCenterPatient Address: [address] [address] [phone] No Known Drug Allergies Provider's Orders Dr. CDonzetta Matters- ICD10:: J67.341- Follow up regarding non  healing IandD incision on left lower leg, 15 cm tunnel not closing with conventional treatment (wound vac), may possibly need surgical intervention to open incision to allow for healing Hand Signature: Date(s): Electronic Signature(s) Signed: 08/24/2021 9:49:22 AM By: Fredirick Maudlin MD FACS Entered By: Fredirick Maudlin on 08/24/2021 09:49:22 -------------------------------------------------------------------------------- Problem List Details Patient Name: Date of Service: [name]. 08/24/2021 8:45 A M Medical Record Number: [id number] Patient Account Number: [id number] Date of Birth/Sex: Treating RN: [birth year] (71 y.o. [name] Primary Care Provider: [name] Other Clinician: Referring Provider: Treating Provider/Extender: [name] in Treatment: 19 Active Problems ICD-10 Encounter Code Description Active Date MDM Diagnosis E11.621 Type 2 diabetes mellitus with foot ulcer 04/12/2021 No Yes 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 04/12/2021 No Yes I89.0 Lymphedema, not elsewhere classified 04/12/2021 No Yes I87.322 Chronic venous hypertension (idiopathic) with inflammation of left lower 04/12/2021 No Yes extremity 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 ulcer of other part of left lower leg with other specified 04/12/2021 No Yes severity 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 of other part of left foot with other specified 04/12/2021 No Yes severity 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 07/20/2021 No Yes Inactive Problems ICD-10 Code Description Active Date Inactive Date E11.42 Type 2 diabetes mellitus with diabetic polyneuropathy 04/12/2021 04/12/2021 L02.416 Cutaneous abscess of left lower limb 06/13/2021 06/13/2021 Resolved Problems Electronic Signature(s) Signed: 08/24/2021 9:27:57 AM By: Fredirick Maudlin MD FACS Entered By: Fredirick Maudlin on 08/24/2021 09:27:56 -------------------------------------------------------------------------------- Progress Note Details Patient Name: Date of Service: [name]. 08/24/2021 8:45 A M Medical Record Number: [id number] Patient Account Number: [id number] Date of Birth/Sex: Treating RN: 04-27-1951 (71 y.o. M) Primary Care Provider: [name] Other Clinician: Referring Provider: Treating Provider/Extender: [name] in Treatment: 19 Subjective Chief  Complaint Information obtained from Patient Left leg and foot ulcers 04/12/2021; patient is here for wounds on his left lower leg and left plantar foot over the first metatarsal head History of Present Illness (HPI) 10/11/17; Mr. Musa is a 71 year old man who tells me that in 2015 he slipped down the latter traumatizing his left leg. He developed a wound in the same spot the area that we are currently looking at. He states this closed over for the most part although he always felt it was somewhat unstable. In 2016 he hit the same area with the door of his car had this reopened. He tells me that this is never really closed although sometimes an inflow it remains open on a constant basis. He has not been using any specific dressing to this except for topical antibiotics the nature of which were not really sure. His primary doctor did send him to see Dr. [name] of interventional cardiology. He underwent an angiogram on 08/06/17 and he underwent a PTA and directional atherectomy of the lesser distal SFA and popliteal arteries which resulted in brisk improvement in blood flow. It was noted that he had 2 vessel runoff through the anterior tibial and peroneal. He is also been to see vascular and interventional radiologist. He was not felt to have any significant superficial venous insufficiency. Presumably is not a candidate for any ablation. It was suggested he come here for wound care. The patient is a type II diabetic on insulin. He also has a history of venous insufficiency. ABIs on the left were noncompressible in our clinic 10/21/17; patient we admitted to the clinic last week. He has a fairly large  chronic ulcer on the left lateral calf in the setting of chronic venous insufficiency. We put Iodosorb on him after an aggressive debridement and 3 layer compression. He complained of pain in his ankle and itching with is skin in fact he scratched the area on the medial calf superiorly at the rim of our wraps  and he has 2 small open areas in that location today which are new. I changed his primary dressing today to silver collagen. As noted he is already had revascularization and does not have any significant superficial venous insufficiency that would be amenable to ablation 10/28/17; patient admitted to the clinic 2 weeks ago. He has a smaller Wound. Scratch injury from last week revealed. There is large wound over the tibial area. This is smaller. Granulation looks healthy. No need for debridement. 11/04/17; the wound on the left lateral calf looks better. Improved dimensions. Surface of this looks better. We've been maintaining him and Kerlix Coban wraps. He finds this much more comfortable. Silver collagen dressing 11/11/17; left lateral Wound continues to look healthy be making progress. Using a #5 curet I removed removed nonviable skin from the surface of the wound and then necrotic debris from the wound surface. Surface of the wound continues to look healthy. ooHe also has an open area on the left great toenail bed. We've been using topical antibiotics. 11/19/17; left anterior lateral wound continues to look healthy but it's not closed. ooHe also had a small wound above this on the left leg ooInitially traumatic wounds in the setting of significant chronic venous insufficiency and stasis dermatitis 11/25/17; left anterior wounds superiorly is closed still a small wound inferiorly. 12/02/17; left anterior tibial area. Arrives today with adherent callus. Post debridement clearly not completely closed. Hydrofera Blue under 3 layer compression. 12/09/17; left anterior tibia. Circumferential eschar however the wound bed looks stable to improved. We've been using Hydrofera Blue under 3 layer compression 12/17/17; left anterior tibia. Apparently this was felt to be closed however when the wrap was taken off there is a skin tear to reopen wounds in the same area we've been using Hydrofera Blue under 3 layer  compression 12/23/17 left anterior tibia. Not close to close this week apparently the [facility] was stuck to this again. Still circumferential eschar requiring debridement. I put a contact layer on this this time under the Hydrofera Blue 12/31/17; left anterior tibia. Wound is better slight amount of hyper-granulation. Using Hydrofera Blue over Adaptic. 01/07/18; left anterior tibia. The wound had some surface eschar however after this was removed he has no open wound.he was already revascularized by Dr. [name] when he came to our clinic with atherectomy of the left SFA and popliteal artery. He was also sent to interventional radiology for venous reflux studies. He was not felt to have significant reflux but certainly has chronic venous changes of his skin with hemosiderin deposition around this area. He will definitely need to lubricate his skin and wear compression stocking and I've talked to him about this. READMISSION 05/26/2018 This is a now 71 year old man we cared for with traumatic wounds on his left anterior lower extremity. He had been previously revascularized during that admission by Dr. [name]. Apparently in follow-up Dr. [name] noted that he had deterioration in his arterial status. He underwent a stent placement in the distal left SFA on 04/22/2018. 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 He went back to the angiography suite on 04/30/2018 he underwent PTA and balloon angioplasty of the occluded left mid  anterior tibial artery, thrombotic occlusion went from 100 to 0% which reconstitutes the posterior tibial artery. He had thrombectomy and aspiration of the peroneal artery. The stent placed in the distal SFA left SFA was still occluded. 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 Noninvasive studies on 05/02/2018  showed an TBI on the left at 0.43 and 0.82 on the right. He has been recuperating at [place] [facility] in [facility] after the most recent hospitalization. He is going home tomorrow. He tells me that 2 weeks ago he traumatized the tip of his left fifth toe. He came in urgently for our review of this. This was a history of before I noted that Dr. [name] had already noted dry gangrenous changes of the left fifth toe 06/09/2018; 2-week follow-up. I did contact Dr. [name] after his last appointment and he apparently saw 1 of Dr. Irven Shelling colleagues the next day. He does not follow-up with Dr. [name] himself until Thursday of this week. 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 Nevertheless there is no evidence of infection no drainage and no pain. He had a new area that this week when we were signing him in today on the left anterior mid tibia area, this is in close proximity to the previous wound we have dealt with in this clinic. 06/23/2018; 2-week follow-up. I did not receive a recent note from Dr. [name] to review today. Our office is trying to obtain this. He is apparently not planning to do further vascular interventions and wondered about compression to try and help with the patient's chronic venous insufficiency. However we are also concerned about the arterial flow. ooHe arrives in clinic today with a new area on the left third toe. The areas on the calf/anterior tibia are close to closing. The left fifth toe is still mummified using Betadine. -In reviewing things with the patient he has what sounds like claudication with mild to moderate amount of activity. 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 I prescribed Levaquin over the phone while we attempted to arrange a plan of care. However the patient called yesterday to report he had low-grade fever and he came in today acutely. There is been a marked deterioration in the left third toe with  spreading cellulitis up into the dorsal left foot. He was referred to the emergency room. Readmission: 06/29/2020 patient presents today for reevaluation here in our clinic he was previously treated by Dr. Dellia Nims at the latter part of 2019 in 2 the beginning of 2020. Subsequently we have not seen him since that time in the interim he did have evaluation with vein and vascular specialist specifically Dr. Anice Paganini who did perform quite extensive work for a left femoral to anterior tibial artery bypass. With that being said in the interim the patient has developed significant lymphedema and has wounds that he tells me have really never healed in regard to the incision site on the left leg. He also has multiple wounds on the feet for various reasons some of which is that he tends to pick at his feet. Fortunately there is no signs of active infection systemically at this time he does have some wounds that are little bit deeper but most are fairly superficial he seems to have good blood flow  and overall everything appears to be healthy I see no bone exposed and no obvious signs of osteomyelitis. I do not know that he necessarily needs a x-ray at this point although that something we could consider depending on how things progress. The patient does have a history of lymphedema, diabetes, this is type II, chronic kidney disease stage III, hypertension, and history of peripheral vascular disease. 07/05/2020; patient admitted last week. Is a patient I remember from 2019 he had a spreading infection involving the left foot and we sent him to the hospital. 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 He subsequently had a left femoral to anterior tibial bypass by Dr.Cain vein and vascular. He also has severe lymphedema with chronic skin changes related to that on the left leg. The most problematic area that was new today was on the left medial great toe. This was apparently a  small area last week there was purulent drainage which our intake nurse cultured. Also areas on the left medial foot and heel left lateral foot. He has 2 areas on the left medial calf left lateral calf in the setting of the severe lymphedema. 07/13/2020 on evaluation today patient appears to be doing better in my opinion compared to his last visit. The good news is there is no signs of active infection systemically and locally I do not see any signs of infection either. He did have an x-ray which was negative that is great news he had a culture which showed MRSA but at the same time he is been on the doxycycline which has helped. I do think we may want to extend this for 7 additional days 1/25; patient admitted to the clinic a few weeks ago. 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 We have been putting him under compression his edema control is a lot better but he is severe verricused skin on the left leg. He is really done quite well he still has an open area on the left medial calf and the left medial first metatarsal head. We have been using silver collagen on the leg silver alginate on the foot 07/27/2020 upon evaluation today patient appears to be doing decently well in regard to his wounds. He still has a lot of dry skin on the left leg. Some of this is starting to peel back and I think he may be able to have them out by removing some that today. Fortunately there is no signs of active infection at this time on the left leg although on the right leg he does appear to have swelling and erythema as well as some mild warmth to touch. This does have been concerned about the possibility of cellulitis although within the differential diagnosis I do think that potentially a DVT has to be at least considered. We need to rule that out before proceeding would just call in the cellulitis. Especially since he is having pain in the posterior aspect of his calf  muscle. 2/8; the patient had seen sparingly. He has severe skin changes of chronic lymphedema in the left leg thickened hyperkeratotic verrucous skin. He has an open wound on the medial part of the left first met head left mid tibia. He also has a rim of nonepithelialized skin in the anterior mid tibia. He brought in the AmLactin lotion that was been prescribed although I am not sure under compression and its utility. There concern about  cellulitis on the right lower leg the last time he was here. He was put on on antibiotics. His DVT rule out was negative. The right leg looks fine he is using his stocking on this area 08/10/2020 upon evaluation today patient appears to be doing well with regard to his leg currently. He has been tolerating the dressing changes without complication. Fortunately there is no signs of active infection which is great news. Overall very pleased with where things stand. 2/22; the patient still has an area on the medial part of the left first met his head. This looks better than when I last saw this earlier this month he has a rim of epithelialization but still some surface debris. Mostly everything on the left leg is healed. There is still a vulnerable in the left mid tibia area. 08/30/2020 upon evaluation today patient appears to be doing much better in regard to his wounds on his foot. 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 Nonetheless I think we will address that today I Minna send in a prescription for him in that regard. Overall though there does not appear to be any signs of significant worsening.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 Service: [name] 08/24/2021 Medical Record Number: [id number] Patient Account Number: [id number] Date of Birth/Sex: Treating RN: 03/22/1951 (71 y.o. 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 Facility Procedures CPT4 Code: [id number] Description: 7597 - DEBRIDE WOUND 1ST 20 SQ CM OR < ICD-10 Diagnosis Description 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 of other part  of left foot with other specified severit Modifier: y Quantity: 1 Physician Procedures : CPT4 Code Description Modifier [id number] 41067 - WC PHYS LEVEL 3 - EST PT ICD-10 Diagnosis Description L97.828  Non-pressure chronic ulcer of other part of left lower leg with other specified severity Quantity: 1 : [id number] 06678 - WC PHYS DEBR WO ANESTH 20 SQ CM ICD-10 Diagnosis Description 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 of other part of left foot with other specified severity Quantity: 1 Electronic Signature(s) Signed: 08/24/2021 9:57:53 AM By: Fredirick Maudlin MD FACS Previous Signature: 08/24/2021 9:37:05 AM Version By: Fredirick Maudlin MD FACS Entered By: Fredirick Maudlin on 08/24/2021 09:57:52

## 2021-08-24 NOTE — Progress Notes (Signed)
[name], [name] ([id number]) Visit Report for 08/24/2021 Arrival Information Details Patient Name: Date of Service: [name] 08/24/2021 8:45 A M Medical Record Number: [id number] Patient Account Number: [id number] Date of Birth/Sex: Treating RN: 20-Nov-1950 (71 y.o. M) Primary Care Ekansh Sherk: [name] Other Clinician: Referring Iria Jamerson: Treating Haifa Hatton/Extender: [name] in Treatment: 59 Visit Information History Since Last Visit Added or deleted any medications: No Patient Arrived: Ambulatory Any new allergies or adverse reactions: No Arrival Time: 08:46 Had a fall or experienced change in No [name] By: self activities of daily living that may affect Transfer Assistance: None risk of falls: Patient Identification Verified: Yes Signs or symptoms of abuse/neglect since last visito No Secondary Verification Process Completed: Yes Hospitalized since last visit: No Patient Requires Transmission-Based Precautions: No Implantable device outside of the clinic excluding No Patient Has Alerts: Yes cellular tissue based products placed in the center Patient Alerts: ABI's: 09/22 L:1.09 R: 1. since last visit: TBI's: R:0.72 L:0.99 Has Dressing in Place as Prescribed: Yes Pain Present Now: Yes Electronic Signature(s) Signed: 08/24/2021 9:21:53 AM By: Sandre Kitty Entered By: Sandre Kitty on 08/24/2021 08:47:24 -------------------------------------------------------------------------------- Compression Therapy Details Patient Name: Date of Service: [name]. 08/24/2021 8:45 A M Medical Record Number: [id number] Patient Account Number: [id number] Date of Birth/Sex: Treating RN: 1951/02/02 (71 y.o. [name] Primary Care Emiah Pellicano: [name] Other Clinician: Referring Lailoni Baquera: Treating Virlee Stroschein/Extender: [name] in Treatment: 19 Compression Therapy Performed for Wound Assessment: Wound #22  Left,Lateral Lower Leg Performed By: Clinician Levan Hurst, RN Compression Type: Double Layer Post Procedure Diagnosis Same as Pre-procedure Electronic Signature(s) Signed: 08/24/2021 5:54:42 PM By: Levan Hurst RN, BSN Entered By: Levan Hurst on 08/24/2021 09:52:52 -------------------------------------------------------------------------------- Compression Therapy Details Patient Name: Date of Service: [name]. 08/24/2021 8:45 A M Medical Record Number: [id number] Patient Account Number: [id number] Date of Birth/Sex: Treating RN: 1950-10-04 (71 y.o. [name] Primary Care Reynolds Kittel: [name] Other Clinician: Referring Chakira Jachim: Treating Yolanda Huffstetler/Extender: [name] in Treatment: 19 Compression Therapy Performed for Wound Assessment: Wound #18 Left,Plantar Metatarsal head first Performed By: Clinician Levan Hurst, RN Compression Type: Double Layer Post Procedure Diagnosis Same as Pre-procedure Electronic Signature(s) Signed: 08/24/2021 5:54:42 PM By: Levan Hurst RN, BSN Entered By: Levan Hurst on 08/24/2021 09:52:52 -------------------------------------------------------------------------------- Encounter Discharge Information Details Patient Name: Date of Service: [name]. 08/24/2021 8:45 A M Medical Record Number: [id number] Patient Account Number: [id number] Date of Birth/Sex: Treating RN: 09-11-1950 (71 y.o. [name] Primary Care Srihan Brutus: [name] Other Clinician: Referring Acelyn Basham: Treating Lacy Taglieri/Extender: [name] in Treatment: 19 Encounter Discharge Information Items Post Procedure Vitals Discharge Condition: Stable Temperature (F): 98.7 Ambulatory Status: Ambulatory Pulse (bpm): 69 Discharge Destination: Home Respiratory Rate (breaths/min): 20 Transportation: Private Auto Blood Pressure (mmHg): 158/81 [name] Schedule Follow-up  Appointment: Yes Clinical Summary of Care: Patient Declined Electronic Signature(s) Signed: 08/24/2021 5:54:42 PM By: Levan Hurst RN, BSN Entered By: Levan Hurst on 08/24/2021 11:24:22 -------------------------------------------------------------------------------- Lower Extremity Assessment Details Patient Name: Date of Service: [name]. 08/24/2021 8:45 A M Medical Record Number: [id number] Patient Account Number: [id number] Date of Birth/Sex: Treating RN: 05/15/1951 (71 y.o. [name] Primary Care Lorenna Lurry: [name] Other Clinician: Referring Tahja Liao: Treating Indra Wolters/Extender: [name] in Treatment: 19 Edema Assessment Assessed: [name]: No] [name]: No] Edema: [Left: Ye] [Right: s] Calf Left: Right: Point of Measurement: 41 cm From Medial Instep 44  cm Ankle Left: Right: Point of Measurement: 10 cm From Medial Instep 27.5 cm Vascular Assessment Pulses: Dorsalis Pedis Palpable: [Left:Yes] Electronic Signature(s) Signed: 08/24/2021 5:54:42 PM By: Levan Hurst RN, BSN Entered By: Levan Hurst on 08/24/2021 09:08:43 -------------------------------------------------------------------------------- Multi Wound Chart Details Patient Name: Date of Service: [name]. 08/24/2021 8:45 A M Medical Record Number: [id number] Patient Account Number: [id number] Date of Birth/Sex: Treating RN: [birth year] (71 y.o. M) Primary Care Irini Leet: [name] Other Clinician: Referring Twila Rappa: Treating Braydyn Schultes/Extender: [name] in Treatment: 19 Vital Signs Height(in): 31 Pulse(bpm): 92 Weight(lbs): 238 Blood Pressure(mmHg): 158/81 Body Mass Index(BMI): 30.6 Temperature(F): 98.7 Respiratory Rate(breaths/min): 20 Photos: [N/A:N/A] Left, Plantar Metatarsal head first Left, Lateral Lower Leg N/A Wound Location: Gradually Appeared Bump N/A Wounding Event: Diabetic Wound/Ulcer of the Lower Cyst  N/A Primary Etiology: Extremity Glaucoma, Sleep Apnea, Glaucoma, Sleep Apnea, N/A Comorbid History: Hypertension, Peripheral Arterial Hypertension, Peripheral Arterial Disease, Peripheral Venous Disease, Disease, Peripheral Venous Disease, Type II Diabetes, Gout, Osteoarthritis, Type II Diabetes, Gout, Osteoarthritis, Neuropathy Neuropathy 08/23/2020 06/03/2021 N/A Date Acquired: 19 11 N/A Weeks of Treatment: Open Open N/A Wound Status: No No N/A Wound Recurrence: 1x1.4x0.1 1.3x0.6x0.5 N/A Measurements L x W x D (cm) 1.1 0.613 N/A A (cm) : rea 0.11 0.306 N/A Volume (cm) : 92.20% 62.80% N/A % Reduction in A rea: 96.10% 76.80% N/A % Reduction in Volume: 12 Position 1 (o'clock): 14.7 Maximum Distance 1 (cm): No Yes N/A Tunneling: Grade 2 Full Thickness With Exposed Support N/A Classification: Structures Medium Large N/A Exudate Amount: Serosanguineous Sanguinous N/A Exudate Type: red, brown red N/A Exudate Color: Flat and Intact Flat and Intact N/A Wound Margin: Large (67-100%) Large (67-100%) N/A Granulation Amount: Red, Pink Red, Pink N/A Granulation Quality: Small (1-33%) None Present (0%) N/A Necrotic Amount: Fat Layer (Subcutaneous Tissue): Yes Fat Layer (Subcutaneous Tissue): Yes N/A Exposed Structures: Fascia: No Fascia: No Tendon: No Tendon: No Muscle: No Muscle: No Joint: No Joint: No Bone: No Bone: No Small (1-33%) Small (1-33%) N/A Epithelialization: Debridement - Selective/Open Wound N/A N/A Debridement: Pre-procedure Verification/Time Out 09:20 N/A N/A Taken: Callus N/A N/A Tissue Debrided: Non-Viable Tissue N/A N/A Level: 1.4 N/A N/A Debridement A (sq cm): rea Curette N/A N/A Instrument: Minimum N/A N/A Bleeding: Pressure N/A N/A Hemostasis A chieved: 0 N/A N/A Procedural Pain: 0 N/A N/A Post Procedural Pain: Procedure was tolerated well N/A N/A Debridement Treatment Response: 1x1.4x0.1 N/A N/A Post Debridement  Measurements L x W x D (cm) 0.11 N/A N/A Post Debridement Volume: (cm) Debridement N/A N/A Procedures Performed: Treatment Notes Electronic Signature(s) Signed: 08/24/2021 9:28:28 AM By: Fredirick Maudlin MD FACS Entered By: Fredirick Maudlin on 08/24/2021 09:28:28 -------------------------------------------------------------------------------- Multi-Disciplinary Care Plan Details Patient Name: Date of Service: [name]. 08/24/2021 8:45 A M Medical Record Number: [id number] Patient Account Number: [id number] Date of Birth/Sex: Treating RN: [birth year] (71 y.o. [name] Primary Care Roshunda Keir: [name] Other Clinician: Referring Danicia Terhaar: Treating Nocole Zammit/Extender: [name] in Treatment: 19 Multidisciplinary Care Plan reviewed with physician Active Inactive Venous Leg Ulcer Nursing Diagnoses: Knowledge deficit related to disease process and management Potential for venous Insuffiency (use before diagnosis confirmed) Goals: Patient will maintain optimal edema control Date Initiated: 07/27/2021 Target Resolution Date: 09/22/2021 Goal Status: Active Interventions: Assess peripheral edema status every visit. Treatment Activities: Therapeutic compression applied : 07/27/2021 Notes: Wound/Skin Impairment Nursing Diagnoses: Impaired tissue integrity Knowledge deficit related to ulceration/compromised skin integrity Goals: Patient will have a decrease in wound volume by X%  from date: (specify in notes) Date Initiated: 04/12/2021 Date Inactivated: 04/27/2021 Target Resolution Date: 04/23/2021 Goal Status: Met Patient/caregiver will verbalize understanding of skin care regimen Date Initiated: 04/12/2021 Target Resolution Date: 09/22/2021 Goal Status: Active Ulcer/skin breakdown will have a volume reduction of 30% by week 4 Date Initiated: 04/12/2021 Date Inactivated: 04/27/2021 Target Resolution Date: 04/27/2021 Goal Status: Unmet Unmet  Reason: infection Ulcer/skin breakdown will have a volume reduction of 50% by week 8 Date Initiated: 04/27/2021 Date Inactivated: 06/29/2021 Target Resolution Date: 06/24/2021 Goal Status: Met Interventions: Assess patient/caregiver ability to obtain necessary supplies Assess patient/caregiver ability to perform ulcer/skin care regimen upon admission and as needed Assess ulceration(s) every visit Notes: Electronic Signature(s) Signed: 08/24/2021 5:54:42 PM By: Levan Hurst RN, BSN Entered By: Levan Hurst on 08/24/2021 08:51:49 -------------------------------------------------------------------------------- Negative Pressure Wound Therapy Maintenance (NPWT) Details Patient Name: Date of Service: AARIZ, MAISH 08/24/2021 8:45 A M Medical Record Number: [id number] Patient Account Number: [id number] Date of Birth/Sex: Treating RN: 1951-04-10 (71 y.o. [name] Primary Care Smt. Loder: [name] Other Clinician: Referring Onis Markoff: Treating Vihaan Gloss/Extender: [name] in Treatment: 19 NPWT Maintenance Performed for: Wound #22 Left, Lateral Lower Leg Additional Injuries Covered: No Performed By: Levan Hurst, RN Type: VAC System Coverage Size (sq cm): 0.78 Pressure Type: Constant Pressure Setting: 125 mmHG Drain Type: None Sponge/Dressing Type: Gauze Date Initiated: 07/27/2021 Dressing Removed: Yes Quantity of Sponges/Gauze Removed: 1 piece gauze, 1 piece black foam Canister Changed: Yes Dressing Reapplied: Yes Quantity of Sponges/Gauze Inserted: 1 piece sterile gauze, 1 piece black foam Respones T Treatment: o pt tolerated well Days On NPWT : 29 Post Procedure Diagnosis Same as Pre-procedure Electronic Signature(s) Signed: 08/24/2021 5:54:42 PM By: Levan Hurst RN, BSN Entered By: Levan Hurst on 08/24/2021 09:52:36 -------------------------------------------------------------------------------- Pain Assessment Details Patient  Name: Date of Service: [name]. 08/24/2021 8:45 A M Medical Record Number: [id number] Patient Account Number: [id number] Date of Birth/Sex: Treating RN: July 19, 1950 (71 y.o. M) Primary Care Syana Degraffenreid: [name] Other Clinician: Referring Kristena Wilhelmi: Treating Shelsie Tijerino/Extender: [name] in Treatment: 19 Active Problems Location of Pain Severity and Description of Pain Patient Has Paino Yes Site Locations Rate the pain. Current Pain Level: 4 Pain Management and Medication Current Pain Management: Electronic Signature(s) Signed: 08/24/2021 9:21:53 AM By: Sandre Kitty Entered By: Sandre Kitty on 08/24/2021 08:47:53 -------------------------------------------------------------------------------- Patient/Caregiver Education Details Patient Name: Date of Service: [name] 3/2/2023andnbsp8:45 A M Medical Record Number: [id number] Patient Account Number: [id number] Date of Birth/Gender: Treating RN: [birth year] (70 y.o. [name] Primary Care Physician: [name] Other Clinician: Referring Physician: Treating Physician/Extender: [name] in Treatment: 19 Education Assessment Education Provided To: Patient Education Topics Provided Wound/Skin Impairment: Methods: Explain/Verbal Responses: State content correctly [employer]) Signed: 08/24/2021 5:54:42 PM By: Levan Hurst RN, BSN Entered By: Levan Hurst on 08/24/2021 08:52:06 -------------------------------------------------------------------------------- Wound Assessment Details Patient Name: Date of Service: [name]. 08/24/2021 8:45 A M Medical Record Number: [id number] Patient Account Number: [id number] Date of Birth/Sex: Treating RN: 1951/05/14 (71 y.o. M) Primary Care Vernon Maish: [name] Other Clinician: Referring Bhavana Kady: Treating Eugine Bubb/Extender: [name] in Treatment: 19 Wound  Status Wound Number: 18 Primary Diabetic Wound/Ulcer of the Lower Extremity Etiology: Wound Location: Left, Plantar Metatarsal head first Wound Open Wounding Event: Gradually Appeared Status: Date Acquired: 08/23/2020 Comorbid Glaucoma, Sleep Apnea, Hypertension, Peripheral Arterial Disease, Weeks Of Treatment: 19 History: Peripheral Venous Disease, Type II Diabetes, Gout, Osteoarthritis, Clustered Wound:  No Neuropathy Photos Wound Measurements Length: (cm) 1 Width: (cm) 1.4 Depth: (cm) 0.1 Area: (cm) 1.1 Volume: (cm) 0.11 % Reduction in Area: 92.2% % Reduction in Volume: 96.1% Epithelialization: Small (1-33%) Tunneling: No Undermining: No Wound Description Classification: Grade 2 Wound Margin: Flat and Intact Exudate Amount: Medium Exudate Type: Serosanguineous Exudate Color: red, brown Foul Odor After Cleansing: No Slough/Fibrino Yes Wound Bed Granulation Amount: Large (67-100%) Exposed Structure Granulation Quality: Red, Pink Fascia Exposed: No Necrotic Amount: Small (1-33%) Fat Layer (Subcutaneous Tissue) Exposed: Yes Necrotic Quality: Adherent Slough Tendon Exposed: No Muscle Exposed: No Joint Exposed: No Bone Exposed: No Treatment Notes Wound #18 (Metatarsal head first) Wound Laterality: Plantar, Left Cleanser Soap and Water Discharge Instruction: May shower and wash wound with dial antibacterial soap and water prior to dressing change. Wound Cleanser Discharge Instruction: Cleanse the wound with wound cleanser prior to applying a clean dressing using gauze sponges, not tissue or cotton balls. Peri-Wound Care Triamcinolone 15 (g) Discharge Instruction: Use triamcinolone 15 (g) as directed Sween Lotion (Moisturizing lotion) Discharge Instruction: Apply moisturizing lotion as directed Topical Primary Dressing Hydrofera Blue Ready Foam, 2.5 x2.5 in Discharge Instruction: Apply to wound bed as instructed Secondary Dressing ABD Pad, 5x9 Discharge  Instruction: Apply over primary dressing as directed. Secured With Compression Wrap CoFlex TLC XL 2-layer Compression System 4x7 (in/yd) Discharge Instruction: Apply CoFlex 2-layer compression as directed. (alt for 4 layer) Compression Stockings Add-Ons Electronic Signature(s) Signed: 08/24/2021 5:54:42 PM By: Levan Hurst RN, BSN Entered By: Levan Hurst on 08/24/2021 09:06:54 -------------------------------------------------------------------------------- Wound Assessment Details Patient Name: Date of Service: [name]. 08/24/2021 8:45 A M Medical Record Number: [id number] Patient Account Number: [id number] Date of Birth/Sex: Treating RN: 1950-08-08 (71 y.o. M) Primary Care Victorio Creeden: [name] Other Clinician: Referring Trixie Maclaren: Treating Quinn Bartling/Extender: [name] in Treatment: 19 Wound Status Wound Number: 22 Primary Cyst Etiology: Wound Location: Left, Lateral Lower Leg Wound Open Wounding Event: Bump Status: Date Acquired: 06/03/2021 Comorbid Glaucoma, Sleep Apnea, Hypertension, Peripheral Arterial Disease, Weeks Of Treatment: 11 History: Peripheral Venous Disease, Type II Diabetes, Gout, Osteoarthritis, Clustered Wound: No Neuropathy Photos Wound Measurements Length: (cm) 1.3 Width: (cm) 0.6 Depth: (cm) 0.5 Area: (cm) 0.613 Volume: (cm) 0.306 % Reduction in Area: 62.8% % Reduction in Volume: 76.8% Epithelialization: Small (1-33%) Tunneling: Yes Position (o'clock): 12 Maximum Distance: (cm) 14.7 Undermining: No Wound Description Classification: Full Thickness With Exposed Support Structures Wound Margin: Flat and Intact Exudate Amount: Large Exudate Type: Sanguinous Exudate Color: red Foul Odor After Cleansing: No Slough/Fibrino No Wound Bed Granulation Amount: Large (67-100%) Exposed Structure Granulation Quality: Red, Pink Fascia Exposed: No Necrotic Amount: None Present (0%) Fat Layer (Subcutaneous Tissue)  Exposed: Yes Tendon Exposed: No Muscle Exposed: No Joint Exposed: No Bone Exposed: No Treatment Notes Wound #22 (Lower Leg) Wound Laterality: Left, Lateral Cleanser Soap and Water Discharge Instruction: May shower and wash wound with dial antibacterial soap and water prior to dressing change. Wound Cleanser Discharge Instruction: Cleanse the wound with wound cleanser prior to applying a clean dressing using gauze sponges, not tissue or cotton balls. Peri-Wound Care Triamcinolone 15 (g) Discharge Instruction: Use triamcinolone 15 (g) as directed Sween Lotion (Moisturizing lotion) Discharge Instruction: Apply moisturizing lotion as directed Topical Primary Dressing Secondary Dressing Secured With Compression Wrap CoFlex TLC XL 2-layer Compression System 4x7 (in/yd) Discharge Instruction: Apply CoFlex 2-layer compression as directed. (alt for 4 layer) Compression Stockings Add-Ons Electronic Signature(s) Signed: 08/24/2021 5:54:42 PM By: Levan Hurst RN, BSN Entered By: Levan Hurst  on 08/24/2021 09:07:50 -------------------------------------------------------------------------------- Vitals Details Patient Name: Date of Service: MILLAN, LEGAN 08/24/2021 8:45 A M Medical Record Number: [id number] Patient Account Number: [id number] Date of Birth/Sex: Treating RN: 11/04/1950 (71 y.o. M) Primary Care Precious Gilchrest: [name] Other Clinician: Referring Alayzia Pavlock: Treating Steaven Wholey/Extender: [name] in Treatment: 19 Vital Signs Time Taken: 08:47 Temperature (F): 98.7 Height (in): 74 Pulse (bpm): 69 Weight (lbs): 238 Respiratory Rate (breaths/min): 20 Body Mass Index (BMI): 30.6 Blood Pressure (mmHg): 158/81 Reference Range: 80 - 120 mg / dl Electronic Signature(s) Signed: 08/24/2021 9:21:53 AM By: Sandre Kitty Entered By: Sandre Kitty on 08/24/2021 08:47:40

## 2021-08-28 ENCOUNTER — Encounter (HOSPITAL_BASED_OUTPATIENT_CLINIC_OR_DEPARTMENT_OTHER): Payer: Medicare Other | Admitting: General Surgery

## 2021-08-28 ENCOUNTER — Other Ambulatory Visit: Payer: Self-pay

## 2021-08-28 DIAGNOSIS — E11621 Type 2 diabetes mellitus with foot ulcer: Secondary | ICD-10-CM | POA: Diagnosis not present

## 2021-08-28 NOTE — Progress Notes (Signed)
YAIR, DUSZA ([id number]) ?Visit Report for 08/28/2021 ?SuperBill Details ?Patient Name: Date of Service: ?[name], [name] 08/28/2021 ?Medical Record Number: [id number] ?Patient Account Number: [id number] ?Date of Birth/Sex: Treating RN: ?1950-08-25 (71 y.o. 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 ?M75.449 Non-pressure chronic ulcer of other part of left lower leg with other specified severity ?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 of other part of left foot with other specified severity ?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

## 2021-08-29 NOTE — Progress Notes (Signed)
ELDRIDGE, MARCOTT ([id number]) Visit Report for 08/28/2021 Arrival Information Details Patient Name: Date of Service: TAD, FANCHER 08/28/2021 10:00 A M Medical Record Number: [id number] Patient Account Number: [id number] Date of Birth/Sex: Treating RN: 1950-11-03 (71 y.o. M) Primary Care Anthonette Lesage: [name] Other Clinician: Referring Nimesh Riolo: Treating Emanii Bugbee/Extender: [name] in Treatment: 35 Visit Information History Since Last Visit Added or deleted any medications: No Patient Arrived: Ambulatory Any new allergies or adverse reactions: No Arrival Time: 09:58 Had a fall or experienced change in No Accompanied By: self activities of daily living that may affect Transfer Assistance: None risk of falls: Patient Identification Verified: Yes Signs or symptoms of abuse/neglect since last visito No Secondary Verification Process Completed: Yes Hospitalized since last visit: No Patient Requires Transmission-Based Precautions: No Implantable device outside of the clinic excluding No Patient Has Alerts: Yes cellular tissue based products placed in the center Patient Alerts: ABI's: 09/22 L:1.09 R: 1. since last visit: TBI's: R:0.72 L:0.99 Has Dressing in Place as Prescribed: Yes Pain Present Now: No Electronic Signature(s) Signed: 08/29/2021 10:00:41 AM By: Sandre Kitty Entered By: Sandre Kitty on 08/28/2021 10:00:21 -------------------------------------------------------------------------------- Compression Therapy Details Patient Name: Date of Service: [name] 08/28/2021 10:00 A M Medical Record Number: [id number] Patient Account Number: [id number] Date of Birth/Sex: Treating RN: Apr 26, 1951 (71 y.o. [name] Primary Care Koni Kannan: [name] Other Clinician: Referring Ellowyn Rieves: Treating Sundy Houchins/Extender: [name] in Treatment: 19 Compression Therapy Performed for Wound Assessment: Wound #22  Left,Lateral Lower Leg Performed By: Clinician Levan Hurst, RN Compression Type: Double Layer Electronic Signature(s) Signed: 08/28/2021 5:43:07 PM By: Levan Hurst RN, BSN Entered By: Levan Hurst on 08/28/2021 13:26:17 -------------------------------------------------------------------------------- Compression Therapy Details Patient Name: Date of Service: [name] 08/28/2021 10:00 A M Medical Record Number: [id number] Patient Account Number: [id number] Date of Birth/Sex: Treating RN: 1951-03-12 (71 y.o. [name] Primary Care Joscelin Fray: Other Clinician: [name] Referring Sunil Hue: Treating Lundon Verdejo/Extender: [name] in Treatment: 19 Compression Therapy Performed for Wound Assessment: Wound #18 Left,Plantar Metatarsal head first Performed By: Clinician Levan Hurst, RN Compression Type: Double Layer Electronic Signature(s) Signed: 08/28/2021 5:43:07 PM By: Levan Hurst RN, BSN Entered By: Levan Hurst on 08/28/2021 13:26:17 -------------------------------------------------------------------------------- Encounter Discharge Information Details Patient Name: Date of Service: [name] 08/28/2021 10:00 A M Medical Record Number: [id number] Patient Account Number: [id number] Date of Birth/Sex: Treating RN: 1951-01-17 (71 y.o. [name] Primary Care Jaz Mallick: [name] Other Clinician: Referring Louanna Vanliew: Treating Cesar Rogerson/Extender: [name] in Treatment: 19 Encounter Discharge Information Items Discharge Condition: Stable Ambulatory Status: Ambulatory Discharge Destination: Home Transportation: Private Auto Accompanied By: alone Schedule Follow-up Appointment: Yes Clinical Summary of Care: Patient Declined Electronic Signature(s) Signed: 08/28/2021 5:43:07 PM By: Levan Hurst RN, BSN Entered By: Levan Hurst on 08/28/2021  13:27:15 -------------------------------------------------------------------------------- Negative Pressure Wound Therapy Maintenance (NPWT) Details Patient Name: Date of Service: DESMON, HITCHNER 08/28/2021 10:00 A M Medical Record Number: [id number] Patient Account Number: [id number] Date of Birth/Sex: Treating RN: Feb 06, 1951 (71 y.o. [name] Primary Care Shrika Milos: [name] Other Clinician: Referring Yeslin Delio: Treating Marlinda [name]/Extender: [name] in Treatment: 19 NPWT Maintenance Performed for: Wound #22 Left, Lateral Lower Leg Additional Injuries Covered: No Performed By: Levan Hurst, RN Type: VAC System Coverage Size (sq cm): 0.78 Pressure Type: Constant Pressure Setting: 125 mmHG Drain Type: None Sponge/Dressing Type: Gauze Date Initiated: 07/27/2021 Dressing Removed: Yes Quantity of Sponges/Gauze Removed:  1 piece gauze, 1 piece black foam Canister Changed: No Dressing Reapplied: Yes Quantity of Sponges/Gauze Inserted: 1 piece gauze, 1 piece black foam Respones T Treatment: o pt tolerated well Days On NPWT: 33 Electronic Signature(s) Signed: 08/28/2021 5:43:07 PM By: Levan Hurst RN, BSN Entered By: Levan Hurst on 08/28/2021 17:03:14 -------------------------------------------------------------------------------- Wound Assessment Details Patient Name: Date of Service: [name] 08/28/2021 10:00 A M Medical Record Number: [id number] Patient Account Number: [id number] Date of Birth/Sex: Treating RN: 1950-08-25 (71 y.o. M) Primary Care Ciin Brazzel: [name] Other Clinician: Referring Karthikeya Funke: Treating Taiylor Virden/Extender: [name] in Treatment: 19 Wound Status Wound Number: 18 Primary Etiology: Diabetic Wound/Ulcer of the Lower Extremity Wound Location: Left, Plantar Metatarsal head first Wound Status: Open Wounding Event: Gradually Appeared Date Acquired: 08/23/2020 Weeks Of Treatment:  19 Clustered Wound: No Wound Measurements Length: (cm) 1 Width: (cm) 1.4 Depth: (cm) 0.1 Area: (cm) 1.1 Volume: (cm) 0.11 % Reduction in Area: 92.2% % Reduction in Volume: 96.1% Wound Description Classification: Grade 2 Exudate Amount: Medium Exudate Type: Serosanguineous Exudate Color: red, brown Treatment Notes Wound #18 (Metatarsal head first) Wound Laterality: Plantar, Left Cleanser Soap and Water Discharge Instruction: May shower and wash wound with dial antibacterial soap and water prior to dressing change. Wound Cleanser Discharge Instruction: Cleanse the wound with wound cleanser prior to applying a clean dressing using gauze sponges, not tissue or cotton balls. Peri-Wound Care Triamcinolone 15 (g) Discharge Instruction: Use triamcinolone 15 (g) as directed Sween Lotion (Moisturizing lotion) Discharge Instruction: Apply moisturizing lotion as directed Topical Primary Dressing Hydrofera Blue Ready Foam, 2.5 x2.5 in Discharge Instruction: Apply to wound bed as instructed Secondary Dressing ABD Pad, 5x9 Discharge Instruction: Apply over primary dressing as directed. Secured With Compression Wrap CoFlex TLC XL 2-layer Compression System 4x7 (in/yd) Discharge Instruction: Apply CoFlex 2-layer compression as directed. (alt for 4 layer) Compression Stockings Add-Ons Electronic Signature(s) Signed: 08/29/2021 10:00:41 AM By: Sandre Kitty Entered By: Sandre Kitty on 08/28/2021 10:01:03 -------------------------------------------------------------------------------- Wound Assessment Details Patient Name: Date of Service: [name] 08/28/2021 10:00 A M Medical Record Number: [id number] Patient Account Number: [id number] Date of Birth/Sex: Treating RN: Oct 27, 1950 (71 y.o. M) Primary Care Mirna Sutcliffe: [name] Other Clinician: Referring Nickie [name]: Treating Kaelyn Nauta/Extender: [name] in Treatment: 19 Wound Status Wound  Number: 22 Primary Etiology: Cyst Wound Location: Left, Lateral Lower Leg Wound Status: Open Wounding Event: Bump Date Acquired: 06/03/2021 Weeks Of Treatment: 12 Clustered Wound: No Wound Measurements Length: (cm) 1.3 Width: (cm) 0.6 Depth: (cm) 0.5 Area: (cm) 0.613 Volume: (cm) 0.306 % Reduction in Area: 62.8% % Reduction in Volume: 76.8% Wound Description Classification: Full Thickness With Exposed Support Structure Exudate Amount: Large Exudate Type: Sanguinous Exudate Color: red s Treatment Notes Wound #22 (Lower Leg) Wound Laterality: Left, Lateral Cleanser Soap and Water Discharge Instruction: May shower and wash wound with dial antibacterial soap and water prior to dressing change. Wound Cleanser Discharge Instruction: Cleanse the wound with wound cleanser prior to applying a clean dressing using gauze sponges, not tissue or cotton balls. Peri-Wound Care Triamcinolone 15 (g) Discharge Instruction: Use triamcinolone 15 (g) as directed Sween Lotion (Moisturizing lotion) Discharge Instruction: Apply moisturizing lotion as directed Topical Primary Dressing Secondary Dressing Secured With Compression Wrap CoFlex TLC XL 2-layer Compression System 4x7 (in/yd) Discharge Instruction: Apply CoFlex 2-layer compression as directed. (alt for 4 layer) Compression Stockings Add-Ons Electronic Signature(s) Signed: 08/29/2021 10:00:41 AM By: Sandre Kitty Entered By: Sandre Kitty on 08/28/2021 10:01:03 -------------------------------------------------------------------------------- Vitals Details  Patient Name: Date of Service: LATASHA, PUSKAS 08/28/2021 10:00 A M Medical Record Number: [id number] Patient Account Number: [id number] Date of Birth/Sex: Treating RN: February 20, 1951 (71 y.o. M) Primary Care Jenney Brester: [name] Other Clinician: Referring Donato Studley: Treating Shavon Ashmore/Extender: [name] in Treatment: 19 Vital Signs Time Taken:  10:00 Temperature (F): 98.7 Height (in): 74 Pulse (bpm): 69 Weight (lbs): 238 Respiratory Rate (breaths/min): 20 Body Mass Index (BMI): 30.6 Blood Pressure (mmHg): 158/81 Reference Range: 80 - 120 mg / dl Electronic Signature(s) Signed: 08/29/2021 10:00:41 AM By: Sandre Kitty Entered By: Sandre Kitty on 08/28/2021 10:00:45

## 2021-08-31 ENCOUNTER — Encounter (HOSPITAL_BASED_OUTPATIENT_CLINIC_OR_DEPARTMENT_OTHER): Payer: Medicare Other | Admitting: General Surgery

## 2021-08-31 ENCOUNTER — Other Ambulatory Visit: Payer: Self-pay

## 2021-08-31 DIAGNOSIS — E11621 Type 2 diabetes mellitus with foot ulcer: Secondary | ICD-10-CM | POA: Diagnosis not present

## 2021-09-01 NOTE — Progress Notes (Signed)
[name] ([id number]) Visit Report for 08/31/2021 Chief Complaint Document Details Patient Name: Date of Service: [name], [name] 08/31/2021 10:15 A M Medical Record Number: [id number] Patient Account Number: [id number] Date of Birth/Sex: Treating RN: 1951-05-26 (71 y.o. M) Primary Care Provider: Jilda [name] Other Clinician: Referring Provider: Treating Provider/Extender: [name] in Treatment: 20 Information Obtained from: Patient Chief Complaint Left leg and foot ulcers 04/12/2021; patient is here for wounds on his left lower leg and left plantar foot over the first metatarsal head Electronic Signature(s) Signed: 09/01/2021 7:14:45 AM By: [name] Maudlin MD FACS Entered By: [name] on 09/01/2021 07:14:45 -------------------------------------------------------------------------------- Debridement Details Patient Name: Date of Service: [name]. 08/31/2021 10:15 A M Medical Record Number: [id number] Patient Account Number: [id number] Date of Birth/Sex: Treating RN: 1950-08-11 (71 y.o. [name] Primary Care Provider: Jilda [name] Other Clinician: Referring Provider: Treating Provider/Extender: [name] in Treatment: 20 Debridement Performed for Assessment: Wound #18 Left,Plantar Metatarsal head first Performed By: Physician [name] Maudlin, MD Debridement Type: Debridement Severity of Tissue Pre Debridement: Fat layer exposed Level of Consciousness (Pre-procedure): Awake and Alert Pre-procedure Verification/Time Out Yes - 10:52 Taken: Start Time: 10:52 T Area Debrided (L x W): otal 1.2 (cm) x 1.4 (cm) = 1.68 (cm) Tissue and other material debrided: Non-Viable, Callus Level: Non-Viable Tissue Debridement Description: Selective/Open Wound Instrument: Curette Bleeding: Minimum Hemostasis Achieved: Pressure End Time: 10:54 Procedural Pain: 0 Post Procedural Pain: 0 Response to Treatment:  Procedure was tolerated well Level of Consciousness (Post- Awake and Alert procedure): Post Debridement Measurements of Total Wound Length: (cm) 1.2 Width: (cm) 1.4 Depth: (cm) 0.1 Volume: (cm) 0.132 Character of Wound/Ulcer Post Debridement: Improved Severity of Tissue Post Debridement: Fat layer exposed Post Procedure Diagnosis Same as Pre-procedure Electronic Signature(s) Signed: 09/01/2021 8:31:39 AM By: [name] Maudlin MD FACS Signed: 09/01/2021 1:38:56 PM By: [name] Hurst RN, BSN Entered By: [name] on 08/31/2021 10:54:50 -------------------------------------------------------------------------------- HPI Details Patient Name: Date of Service: [name]. 08/31/2021 10:15 A M Medical Record Number: [id number] Patient Account Number: [id number] Date of Birth/Sex: Treating RN: Dec 04, 1950 (71 y.o. M) Primary Care Provider: Jilda [name] Other Clinician: Referring Provider: Treating Provider/Extender: [name] in Treatment: 20 History of Present Illness HPI Description: 10/11/17; Mr. Matuszak is a 71 year old man who tells me that in 2015 he slipped down the latter traumatizing his left leg. He developed a wound in the same spot the area that we are currently looking at. He states this closed over for the most part although he always felt it was somewhat unstable. In 2016 he hit the same area with the door of his car had this reopened. He tells me that this is never really closed although sometimes an inflow it remains open on a constant basis. He has not been using any specific dressing to this except for topical antibiotics the nature of which were not really sure. His primary doctor did send him to see Dr. [name] of interventional cardiology. He underwent an angiogram on 08/06/17 and he underwent a PTA and directional atherectomy of the lesser distal SFA and popliteal arteries which resulted in brisk improvement in blood flow. It was noted that he  had 2 vessel runoff through the anterior tibial and peroneal. He is also been to see vascular and interventional radiologist. He was not felt to have any significant superficial venous insufficiency. Presumably is not a candidate for any ablation. It was suggested he come here  for wound care. The patient is a type II diabetic on insulin. He also has a history of venous insufficiency. ABIs on the left were noncompressible in our clinic 10/21/17; patient we admitted to the clinic last week. 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 We put Iodosorb on him after an aggressive debridement and 3 layer compression. He complained of pain in his ankle and itching with is skin in fact he scratched the area on the medial calf superiorly at the rim of our wraps and he has 2 small open areas in that location today which are new. I changed his primary dressing today to silver collagen. As noted he is already had revascularization and does not have any significant superficial venous insufficiency that would be amenable to ablation 10/28/17; patient admitted to the clinic 2 weeks ago. He has a smaller Wound. Scratch injury from last week revealed. There is large wound over the tibial area. This is smaller. Granulation looks healthy. No need for debridement. 11/04/17; the wound on the left lateral calf looks better. Improved dimensions. Surface of this looks better. We've been maintaining him and Kerlix Coban wraps. He finds this much more comfortable. Silver collagen dressing 11/11/17; left lateral Wound continues to look healthy be making progress. Using a #5 curet I removed removed nonviable skin from the surface of the wound and then necrotic debris from the wound surface. Surface of the wound continues to look healthy. He also has an open area on the left great toenail bed. We've been using topical antibiotics. 11/19/17; left anterior lateral wound  continues to look healthy but it's not closed. He also had a small wound above this on the left leg Initially traumatic wounds in the setting of significant chronic venous insufficiency and stasis dermatitis 11/25/17; left anterior wounds superiorly is closed still a small wound inferiorly. 12/02/17; left anterior tibial area. Arrives today with adherent callus. Post debridement clearly not completely closed. Hydrofera Blue under 3 layer compression. 12/09/17; left anterior tibia. Circumferential eschar however the wound bed looks stable to improved. We've been using Hydrofera Blue under 3 layer compression 12/17/17; left anterior tibia. Apparently this was felt to be closed however when the wrap was taken off there is a skin tear to reopen wounds in the same area we've been using Hydrofera Blue under 3 layer compression 12/23/17 left anterior tibia. Not close to close this week apparently the [facility] was stuck to this again. Still circumferential eschar requiring debridement. I put a contact layer on this this time under the Hydrofera Blue 12/31/17; left anterior tibia. Wound is better slight amount of hyper-granulation. Using Hydrofera Blue over Adaptic. 01/07/18; left anterior tibia. The wound had some surface eschar however after this was removed he has no open wound.he was already revascularized by Dr. [name] when he came to our clinic with atherectomy of the left SFA and popliteal artery. He was also sent to interventional radiology for venous reflux studies. He was not felt to have significant reflux but certainly has chronic venous changes of his skin with hemosiderin deposition around this area. He will definitely need to lubricate his skin and wear compression stocking and I've talked to him about this. READMISSION 05/26/2018 This is a now 71 year old man we cared for with traumatic wounds on his left anterior lower extremity. He had been previously revascularized during that admission by Dr.  [name]. Apparently in follow-up Dr. [name] noted that he had deterioration  in his arterial status. He underwent a stent placement in the distal left SFA on 04/22/2018. 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 He went back to the angiography suite on 04/30/2018 he underwent PTA and balloon angioplasty of the occluded left mid anterior tibial artery, thrombotic occlusion went from 100 to 0% which reconstitutes the posterior tibial artery. He had thrombectomy and aspiration of the peroneal artery. The stent placed in the distal SFA left SFA was still occluded. 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 He has been recuperating at [place] home in [facility] after the most recent hospitalization. He is going home tomorrow. He tells me that 2 weeks ago he traumatized the tip of his left fifth toe. He came in urgently for our review of this. This was a history of before I noted that Dr. [name] had already noted dry gangrenous changes of the left fifth toe 06/09/2018; 2-week follow-up. I did contact Dr. [name] after his last appointment and he apparently saw 1 of Dr. Irven Shelling colleagues the next day. He does not follow-up with Dr. [name] himself until Thursday of this week. 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 Nevertheless there is no evidence of infection no drainage and no pain. He had a new area that this week when we were signing him in today on the left anterior mid tibia area, this is in close proximity to the previous wound we have dealt with in this clinic. 06/23/2018; 2-week follow-up. I did not receive a recent note from Dr. [name] to review today. Our office is trying to obtain this. He is apparently not  planning to do further vascular interventions and wondered about compression to try and help with the patient's chronic venous insufficiency. However we are also concerned about the arterial flow. He arrives in clinic today with a new area on the left third toe. The areas on the calf/anterior tibia are close to closing. The left fifth toe is still mummified using Betadine. -In reviewing things with the patient he has what sounds like claudication with mild to moderate amount of activity. 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 I prescribed Levaquin over the phone while we attempted to arrange a plan of care. However the patient called yesterday to report he had low-grade fever and he came in today acutely. There is been a marked deterioration in the left third toe with spreading cellulitis up into the dorsal left foot. He was referred to the emergency room. Readmission: 06/29/2020 patient presents today for reevaluation here in our clinic he was previously treated by Dr. Dellia Nims at the latter part of 2019 in 2 the beginning of 2020. Subsequently we have not seen him since that time in the interim he did have evaluation with vein and vascular specialist specifically Dr. Anice Paganini who did perform quite extensive work for a left femoral to anterior tibial artery bypass. With that being said in the interim the patient has developed significant lymphedema and has wounds that he tells me have really never healed in regard to the incision site on the left leg. He also has multiple wounds on the feet for various reasons some of which is  that he tends to pick at his feet. Fortunately there is no signs of active infection systemically at this time he does have some wounds that are little bit deeper but most are fairly superficial he seems to have good blood flow and overall everything appears to be healthy I see no bone exposed and no obvious signs of osteomyelitis. I do not  know that he necessarily needs a x-ray at this point although that something we could consider depending on how things progress. The patient does have a history of lymphedema, diabetes, this is type II, chronic kidney disease stage III, hypertension, and history of peripheral vascular disease. 07/05/2020; patient admitted last week. Is a patient I remember from 2019 he had a spreading infection involving the left foot and we sent him to the hospital. 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 He subsequently had a left femoral to anterior tibial bypass by Dr.Cain vein and vascular. He also has severe lymphedema with chronic skin changes related to that on the left leg. The most problematic area that was new today was on the left medial great toe. This was apparently a small area last week there was purulent drainage which our intake nurse cultured. Also areas on the left medial foot and heel left lateral foot. He has 2 areas on the left medial calf left lateral calf in the setting of the severe lymphedema. 07/13/2020 on evaluation today patient appears to be doing better in my opinion compared to his last visit. The good news is there is no signs of active infection systemically and locally I do not see any signs of infection either. He did have an x-ray which was negative that is great news he had a culture which showed MRSA but at the same time he is been on the doxycycline which has helped. I do think we may want to extend this for 7 additional days 1/25; patient admitted to the clinic a few weeks ago. 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 We have been putting him under compression his edema control is a lot better but he is severe verricused skin on the left leg. He is really done quite well he still has an open area on the left medial calf and the left medial first metatarsal head. We have been using silver  collagen on the leg silver alginate on the foot 07/27/2020 upon evaluation today patient appears to be doing decently well in regard to his wounds. He still has a lot of dry skin on the left leg. Some of this is starting to peel back and I think he may be able to have them out by removing some that today. Fortunately there is no signs of active infection at this time on the left leg although on the right leg he does appear to have swelling and erythema as well as some mild warmth to touch. This does have been concerned about the possibility of cellulitis although within the differential diagnosis I do think that potentially a DVT has to be at least considered. We need to rule that out before proceeding would just call in the cellulitis. Especially since he is having pain in the posterior aspect of his calf muscle. 2/8; the patient had seen sparingly. He has severe skin changes of chronic lymphedema in the left leg thickened hyperkeratotic verrucous skin. He has an open wound on the medial part of the left  first met head left mid tibia. He also has a rim of nonepithelialized skin in the anterior mid tibia. He brought in the AmLactin lotion that was been prescribed although I am not sure under compression and its utility. There concern about cellulitis on the right lower leg the last time he was here. He was put on on antibiotics. His DVT rule out was negative. The right leg looks fine he is using his stocking on this area 08/10/2020 upon evaluation today patient appears to be doing well with regard to his leg currently. He has been tolerating the dressing changes without complication. Fortunately there is no signs of active infection which is great news. Overall very pleased with where things stand. 2/22; the patient still has an area on the medial part of the left first met his head. This looks better than when I last saw this earlier this month he has a rim of epithelialization but still some surface  debris. Mostly everything on the left leg is healed. There is still a vulnerable in the left mid tibia area. 08/30/2020 upon evaluation today patient appears to be doing much better in regard to his wounds on his foot. 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 Nonetheless I think we will address that today I Minna send in a prescription for him in that regard. Overall though there does not appear to be any signs of significant worsening. 09/07/2020 on evaluation today patient's wounds over his left foot appear to be doing excellent. I do not see any signs of infection there is some callus buildup this can require debridement for certain but overall I feel like he is managing quite nicely. He still using the AmLactin cream which has been beneficial for him as well. 3/22; left foot wound is closed. There is no open area here. He is using ammonium lactate lotion to the lower extremities to help exfoliate dry cracked skin. He has compression stockings from elastic therapy in . The wound on the medial part of his left first met head is healed today. READMISSION 04/12/2021 Mr. Ruderman is a patient we know fairly well he had a prolonged stay in clinic in 2019 with wounds on his left lateral and left anterior lower extremity in the setting of chronic venous insufficiency. More recently he was here earlier this year with predominantly an area on his left foot first metatarsal head plantar and he says the plantar foot broke down on its not long after we discharged him but he did not come back here. The last few months areas of broken down on his left anterior and again the left lateral lower extremity. The leg itself is very swollen chronically enlarged a lot of hyperkeratotic dry Berry Q skin in the left lower leg. His edema extends well into the thigh. He was seen by  Dr. Donzetta Matters. He had ABIs on 03/02/2021 showing an ABI on the right of 1 with a TBI of 0.72 his ABI in the left at 1.09 TBI of 0.99. Monophasic and biphasic waveforms on the right. On the left monophasic waveforms were noted he went on to have an angiogram on 03/27/2021 this showed the aortic aortic and iliac segments were free of flow-limiting stenosis the left common femoral vein to evaluate the left femoral to anterior tibial artery bypass was unobstructed the bypass was patent without any areas of  stenosis. We discharged the patient in bilateral juxta lite stockings but very clearly that was not sufficient to control the swelling and maintain skin integrity. He is clearly going to need compression pumps. 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 This is fortunate because he is on his feet for long periods of time. 10/27; patient comes in with our intake nurse reporting copious amount of green drainage from the left anterior mid tibia the left dorsal foot and to a lesser extent the left medial mid tibia. We left the compression wrap on all week for the amount of edema in his left leg is quite a bit better. We use silver alginate as the primary dressing 11/3; edema control is good. Left anterior lower leg left medial lower leg and the plantar first metatarsal head. The left anterior lower leg required debridement. Deep tissue culture I did of this wound showed MRSA I put him on 10 days of doxycycline which she will start today. We have him in compression wraps. He has a security card and [employer] however he is retiring on November 15. We will need to then get him into a better offloading boot for the left foot perhaps a total contact cast 11/10; edema control is quite good. Left anterior and left medial lower leg wounds in the setting of chronic venous insufficiency and lymphedema. He also has a substantial area over the left plantar first metatarsal head.  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 He has significant hypergranulation on the left plantar foot wound. The patient is a diabetic but he does not have significant PAD 11/17; edema control is quite good. Left anterior and left medial lower leg wounds look better. The really concerning area remains the area on the left plantar first metatarsal head. He has a rim of epithelialization. 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 Starting today with a forefoot off loader but . possibly a total contact cast. 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 Arrives with a very significant odor from I think all of the wounds on the left leg and the left foot. Because of this I did not put a total contact cast on him today but will could still consider this. His wife was having cataract surgery which is the reason he missed the appointment 12/6. I saw this man 5 days ago with a swelling below the popliteal fossa. I thought he actually might have a Baker's cyst however the DVT rule out study that we could arrange right away was negative the technician told me this was not a ruptured Baker's cyst. We attempted to get this aspirated by under ultrasound guidance in interventional radiology however all they did was an ultrasound however it shows an extensive fluid collection 62 x 8 x 9.4 in the left thigh and left calf. The patient states he thinks this started 8 days ago or so but he really is not complaining of any pain, fever or systemic symptoms. He has not ha 12/20; after some difficulty I managed to get the patient into see  Dr. Donzetta Matters. Eventually he was taken into the hospital and had a drain put in  the fluid collection below his left knee posteriorly extending into the posterior thigh. He still has the drain in place. Culture of this showed moderate staff aureus few Morganella and few Klebsiella he is now on doxycycline and ciprofloxacin as suggested by infectious disease he is on this for a month. The drain will remain in place until it stops draining 12/29; he comes in today with the 1 wound on his left leg and the area on the left plantar first met head significantly smaller. Both look healthy. He still has the drain in the left leg. He says he has to change this daily. Follows up with Dr. Donzetta Matters on January 11. 06/29/2021; the wounds that I am following on the left leg and left first met head continued to be quite healthy. However the area where his inferior drain is in place had copious amounts of drainage which was green in color. The wound here is larger. Follows up with Dr. Gwenlyn Saran of vein and vascular his surgeon next week as well as infectious disease. He remains on ciprofloxacin and doxycycline. He is not complaining of excessive pain in either one of the drain areas 1/12; the patient saw vascular surgery and infectious disease. Vascular surgery has left the drain in place as there was still some notable drainage still see him back in 2 weeks. Dr. Velna Ochs stop the doxycycline and ciprofloxacin and I do not believe he follows up with them at this point. Culture I did last week showed both doxycycline resistant MRSA and Pseudomonas not sensitive to ciprofloxacin although only in rare titers 1/19; the patient's wound on the left anterior lower leg is just about healed. We have continued healing of the area that was medially on the left leg. Left first plantar metatarsal head continues to get smaller. The major problem here is his 2 drain sites 1 on the left upper calf and lateral thigh. There is purulent drainage still from the left lateral thigh. I gave him antibiotics last week but we still  have recultured. He has the drain in the area I think this is eventually going to have to come out. I suspect there will be a connecting wound to heal here perhaps with improved VAc 1/26; the patient had his drain removed by vein and vascular on 1/25/. This was a large pocket of fluid in his left thigh that seem to tunnel into his left upper calf. 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 His mention his Penrose drain was removed today. He now has a tunneling wound on his left calf and left thigh. Both of these probe widely towards each other although I cannot really prove that they connect. Both wounds on his lower leg anteriorly are closed and his area over the first metatarsal head on his right foot continues to improve. We are using Hydrofera Blue here. He also saw infectious disease culture of the abscess they noted was polymicrobial with MRSA, Morganella and Klebsiella he was treated with doxycycline and ciprofloxacin for 4 weeks ending on 07/03/2021. They did not recommend any further antibiotics. Notable that while he still had the Penrose drain in place last week he had purulent drainage coming out of the inferior IandD site this grew [facility], MRSA and Pseudomonas but there does not appear to be any active infection in this area today with the drain out and he is not systemically unwell  2/2; with regards to the drain sites the superior one on the thigh actually is closed down the one on the upper left lateral calf measures about 8 and half centimeters which is an improvement seems to be less prominent although still with a lot of drainage. The only remaining wound is over the first metatarsal head on the left foot and this looks to be continuing to improve with Hydrofera Blue. 2/9; the area on his plantar left foot continues to contract. Callus around the wound edge. The drain sites specifically have not come down in depth. We put the wound VAC on Monday he changed the  canister late last night our intake nurse reported a pocket of fluid perhaps caused by our compression wraps 2/16; continued improvement in left foot plantar wound. drainage site in the calf is not improved in terms of depth (wound vac) 2/23; continued improvement in the left foot wound over the first metatarsal head. With regards to the drain sites the area on his thigh laterally is healed however the open area on his calf is small in terms of circumference by still probes in by about 15 cm. Within using the wound VAC. Hydrofera Blue on his foot 08/24/2021: The left first metatarsal head wound continues to improve. The wound bed is healthy with just some surrounding callus. Unfortunately the open drain site on his calf remains open and tunnels at least 15 cm (the extent of a Q-tip). This is despite several weeks of wound VAC treatment. Based on reading back through the notes, there has been really no significant change in the depth of the wound, although the orifice is smaller and the more cranial wound on his thigh has closed. I suspect the tunnel tracks nearly all the way to this location. 08/31/2021: Continued improvement in the left first metatarsal head wound. There has been absolutely no improvement to the long tunnel from his open drain site on his calf. We have tried to get him into see vascular surgery sooner to consider the possibility of simply filleting the tract open and allowing it to heal from the bottom up, likely with a wound VAC. They have not yet scheduled a sooner appointment than his current mid April Electronic Signature(s) Signed: 09/01/2021 7:16:19 AM By: [name] Maudlin MD FACS Entered By: [name] on 09/01/2021 07:16:19 -------------------------------------------------------------------------------- Physical Exam Details Patient Name: Date of Service: [name]. 08/31/2021 10:15 A M Medical Record Number: [id number] Patient Account Number: [id number] Date of  Birth/Sex: Treating RN: 19-Apr-1951 (71 y.o. M) Primary Care Provider: Jilda [name] Other Clinician: Referring Provider: Treating Provider/Extender: [name] in Treatment: 38 Constitutional He is hypertensive, but asymptomatic.. . . . No acute distress. Respiratory Normal work of breathing on room air. Notes 08/31/2021: Wound examthe plantar first metatarsal head continues to demonstrate improvement with good granulation tissue at the base. He tends to build up a fair amount of callus week to week. The tunnel from his left calf drain site, however, has made no improvement whatsoever. It remains tracking nearly all the way to the upper thigh site. Electronic Signature(s) Signed: 09/01/2021 7:17:57 AM By: [name] Maudlin MD FACS Entered By: [name] on 09/01/2021 07:17:57 -------------------------------------------------------------------------------- Physician Orders Details Patient Name: Date of Service: [name]. 08/31/2021 10:15 A M Medical Record Number: [id number] Patient Account Number: [id number] Date of Birth/Sex: Treating RN: 1951/02/13 (71 y.o. [name] Primary Care Provider: Jilda [name] Other Clinician: Referring Provider: Treating Provider/Extender: [name]  in Treatment: 20 Verbal / Phone Orders: No Diagnosis Coding ICD-10 Coding Code Description E11.621 Type 2 diabetes mellitus with foot ulcer 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 I89.0 Lymphedema, not elsewhere classified I87.322 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verity 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 Follow-up  Appointments ppointment in 1 week. - Dr Celine Ahr on Thursday Return A Nurse Visit: - Monday for [facility] change Bathing/ Shower/ Hygiene May shower with protection but do not get wound dressing(s) wet. - Use a cast protector so you can shower without getting your wrap(s) wet Negative Presssure Wound Therapy Wound #22 Left,Lateral Lower Leg Wound Vac to wound continuously at 166m/hg pressure - change 2 times per week in clinic Black Foam Sterile Gauze Packing - in tunnel Edema Control - Lymphedema / SCD / Other Elevate legs to the level of the heart or above for 30 minutes daily and/or when sitting, a frequency of: - throughout the day Avoid standing for long periods of time. Patient to wear own compression stockings every day. - on right leg; Moisturize legs daily. - Ammonium LACTATE to BLE every day. Off-Loading Wedge shoe to: - left front foot offloader Wound Treatment Wound #18 - Metatarsal head first Wound Laterality: Plantar, Left Cleanser: Soap and Water 2 x Per Week/7 Days Discharge Instructions: May shower and wash wound with dial antibacterial soap and water prior to dressing change. Cleanser: Wound Cleanser 2 x Per Week/7 Days Discharge Instructions: Cleanse the wound with wound cleanser prior to applying a clean dressing using gauze sponges, not tissue or cotton balls. Peri-Wound Care: Triamcinolone 15 (g) 2 x Per Week/7 Days Discharge Instructions: Use triamcinolone 15 (g) as directed Peri-Wound Care: Sween Lotion (Moisturizing lotion) 2 x Per Week/7 Days Discharge Instructions: Apply moisturizing lotion as directed Prim Dressing: Hydrofera Blue Ready Foam, 2.5 x2.5 in 2 x Per Week/7 Days ary Discharge Instructions: Apply to wound bed as instructed Secondary Dressing: ABD Pad, 5x9 2 x Per Week/7 Days Discharge Instructions: Apply over primary dressing as directed. Compression Wrap: CoFlex TLC XL 2-layer Compression System 4x7 (in/yd) 2 x Per Week/7 Days Discharge Instructions:  Apply CoFlex 2-layer compression as directed. (alt for 4 layer) Wound #22 - Lower Leg Wound Laterality: Left, Lateral Cleanser: Soap and Water 2 x Per Week/7 Days Discharge Instructions: May shower and wash wound with dial antibacterial soap and water prior to dressing change. Cleanser: Wound Cleanser 2 x Per Week/7 Days Discharge Instructions: Cleanse the wound with wound cleanser prior to applying a clean dressing using gauze sponges, not tissue or cotton balls. Peri-Wound Care: Triamcinolone 15 (g) 2 x Per Week/7 Days Discharge Instructions: Use triamcinolone 15 (g) as directed Peri-Wound Care: Sween Lotion (Moisturizing lotion) 2 x Per Week/7 Days Discharge Instructions: Apply moisturizing lotion as directed Compression Wrap: CoFlex TLC XL 2-layer Compression System 4x7 (in/yd) 2 x Per Week/7 Days Discharge Instructions: Apply CoFlex 2-layer compression as directed. (alt for 4 layer) Electronic Signature(s) Signed: 09/01/2021 8:31:39 AM By: CFredirick MaudlinMD FACS Entered By: CFredirick Maudlinon 09/01/2021 07:18:36 -------------------------------------------------------------------------------- Problem List Details Patient Name: Date of Service: LLucillie [name] 08/31/2021 10:15 A M Medical Record Number: [id number] Account Number: [id number] of Birth/Sex: Treating RN: [id number](71y.o. MMarcheta GrammesPrimary Care Provider: MMellody Drown  Carloyn Manner Other Clinician: Referring Provider: Treating Provider/Extender: [name] in Treatment: 20 Active Problems ICD-10 Encounter Code Description Active Date MDM Code Description Active Date MDM Diagnosis E11.621 Type 2 diabetes mellitus with foot ulcer 04/12/2021 No Yes 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 04/12/2021 No Yes I89.0 Lymphedema, not elsewhere classified 04/12/2021 No Yes I87.322 Chronic venous hypertension (idiopathic) with inflammation of left  lower 04/12/2021 No Yes extremity 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 ulcer of other part of left lower leg with other specified 04/12/2021 No Yes severity 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 of other part of left foot with other specified 04/12/2021 No Yes severity 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 07/20/2021 No Yes Inactive Problems ICD-10 Code Description Active Date Inactive Date E11.42 Type 2 diabetes mellitus with diabetic polyneuropathy 04/12/2021 04/12/2021 L02.416 Cutaneous abscess of left lower limb 06/13/2021 06/13/2021 Resolved Problems Electronic Signature(s) Signed: 09/01/2021 7:14:04 AM By: [name] Maudlin MD FACS Previous Signature: 08/31/2021 5:23:31 PM Version By: Lorrin Jackson Entered By: [name] on 09/01/2021 07:14:04 -------------------------------------------------------------------------------- Progress Note Details Patient Name: Date of Service: [name]. 08/31/2021 10:15 A M Medical Record Number: [id number] Patient Account Number: [id number] Date of Birth/Sex: Treating RN: [birth year] (71 y.o. M) Primary Care Provider: Jilda [name] Other Clinician: Referring Provider: Treating Provider/Extender: [name] in Treatment: 20 Subjective Chief Complaint Information obtained from Patient Left leg and foot ulcers 04/12/2021; patient is here for wounds on his left lower leg and left plantar foot over the first metatarsal head History of Present Illness (HPI) 10/11/17; Mr. Mitcheltree is a 71 year old man who tells me that in 2015 he slipped down the latter traumatizing his left leg. He developed a wound in the same spot the area that we are currently looking at. He states this closed over for the most part although he always felt it was somewhat unstable. In 2016 he hit the same area with the door of his car had this reopened. He tells me that this is never really closed although sometimes an  inflow it remains open on a constant basis. He has not been using any specific dressing to this except for topical antibiotics the nature of which were not really sure. His primary doctor did send him to see Dr. [name] of interventional cardiology. He underwent an angiogram on 08/06/17 and he underwent a PTA and directional atherectomy of the lesser distal SFA and popliteal arteries which resulted in brisk improvement in blood flow. It was noted that he had 2 vessel runoff through the anterior tibial and peroneal. He is also been to see vascular and interventional radiologist. He was not felt to have any significant superficial venous insufficiency. Presumably is not a candidate for any ablation. It was suggested he come here for wound care. The patient is a type II diabetic on insulin. He also has a history of venous insufficiency. ABIs on the left were noncompressible in our clinic 10/21/17; patient we admitted to the clinic last week. 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 We put Iodosorb on him after an aggressive debridement and 3 layer compression. He complained of pain in his ankle and itching with is skin in fact he scratched the area on the medial calf superiorly at the rim of our wraps and he has 2 small open areas in that location today which are new. I changed his primary dressing today  to silver collagen. As noted he is already had revascularization and does not have any significant superficial venous insufficiency that would be amenable to ablation 10/28/17; patient admitted to the clinic 2 weeks ago. He has a smaller Wound. Scratch injury from last week revealed. There is large wound over the tibial area. This is smaller. Granulation looks healthy. No need for debridement. 11/04/17; the wound on the left lateral calf looks better. Improved dimensions. Surface of this looks better. We've been maintaining him and Kerlix  Coban wraps. He finds this much more comfortable. Silver collagen dressing 11/11/17; left lateral Wound continues to look healthy be making progress. Using a #5 curet I removed removed nonviable skin from the surface of the wound and then necrotic debris from the wound surface. Surface of the wound continues to look healthy. ooHe also has an open area on the left great toenail bed. We've been using topical antibiotics. 11/19/17; left anterior lateral wound continues to look healthy but it's not closed. ooHe also had a small wound above this on the left leg ooInitially traumatic wounds in the setting of significant chronic venous insufficiency and stasis dermatitis 11/25/17; left anterior wounds superiorly is closed still a small wound inferiorly. 12/02/17; left anterior tibial area. Arrives today with adherent callus. Post debridement clearly not completely closed. Hydrofera Blue under 3 layer compression. 12/09/17; left anterior tibia. Circumferential eschar however the wound bed looks stable to improved. We've been using Hydrofera Blue under 3 layer compression 12/17/17; left anterior tibia. Apparently this was felt to be closed however when the wrap was taken off there is a skin tear to reopen wounds in the same area we've been using Hydrofera Blue under 3 layer compression 12/23/17 left anterior tibia. Not close to close this week apparently the [facility] was stuck to this again. Still circumferential eschar requiring debridement. I put a contact layer on this this time under the Hydrofera Blue 12/31/17; left anterior tibia. Wound is better slight amount of hyper-granulation. Using Hydrofera Blue over Adaptic. 01/07/18; left anterior tibia. The wound had some surface eschar however after this was removed he has no open wound.he was already revascularized by Dr. [name] when he came to our clinic with atherectomy of the left SFA and popliteal artery. He was also sent to interventional radiology for  venous reflux studies. He was not felt to have significant reflux but certainly has chronic venous changes of his skin with hemosiderin deposition around this area. He will definitely need to lubricate his skin and wear compression stocking and I've talked to him about this. READMISSION 05/26/2018 This is a now 71 year old man we cared for with traumatic wounds on his left anterior lower extremity. He had been previously revascularized during that admission by Dr. [name]. Apparently in follow-up Dr. [name] noted that he had deterioration in his arterial status. He underwent a stent placement in the distal left SFA on 04/22/2018. 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 He went back to the angiography suite on 04/30/2018 he underwent PTA and balloon angioplasty of the occluded left mid anterior tibial artery, thrombotic occlusion went from 100 to 0% which reconstitutes the posterior tibial artery. He had thrombectomy and aspiration of the peroneal artery. The stent placed in the distal SFA left SFA was still occluded. 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 Noninvasive studies on 05/02/2018  showed an TBI on the left at 0.43 and 0.82 on the right. He has been recuperating at [place] home in [facility] after the most recent hospitalization. He is going home tomorrow. He tells me that 2 weeks ago he traumatized the tip of his left fifth toe. He came in urgently for our review of this. This was a history of before I noted that Dr. [name] had already noted dry gangrenous changes of the left fifth toe 06/09/2018; 2-week follow-up. I did contact Dr. [name] after his last appointment and he apparently saw 1 of Dr. Irven Shelling colleagues the next day. He does not follow-up with Dr. [name] himself until Thursday of this week. 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 Nevertheless there is no evidence of infection no drainage and no pain. He had a new area that this week when we were signing him in today on the left anterior mid tibia area, this is in close proximity to the previous wound we have dealt with in this clinic. 06/23/2018; 2-week follow-up. I did not receive a recent note from Dr. [name] to review today. Our office is trying to obtain this. He is apparently not planning to do further vascular interventions and wondered about compression to try and help with the patient's chronic venous insufficiency. However we are also concerned about the arterial flow. ooHe arrives in clinic today with a new area on the left third toe. The areas on the calf/anterior tibia are close to closing. The left fifth toe is still mummified using Betadine. -In reviewing things with the patient he has what sounds like claudication with mild to moderate amount of activity. 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 I prescribed Levaquin over the phone while we attempted to arrange a plan of care. However the patient called yesterday to report he had low-grade fever and he came in today acutely. There is been a marked deterioration in the left third toe with spreading cellulitis up into the dorsal left foot. He was referred to the emergency room. Readmission: 06/29/2020 patient presents today for reevaluation here in our clinic he was previously treated by Dr. Dellia Nims at the latter part of 2019 in 2 the beginning of 2020. Subsequently we have not seen him since that time in the interim he did have evaluation with vein and vascular specialist specifically Dr. Anice Paganini who did perform quite extensive work for a left femoral to anterior tibial artery bypass. With that being said in the interim the patient has developed significant lymphedema and has wounds that he tells me have really never healed in  regard to the incision site on the left leg. He also has multiple wounds on the feet for various reasons some of which is that he tends to pick at his feet. Fortunately there is no signs of active infection systemically at this time he does have some wounds that are little bit deeper but most are fairly superficial he seems to have good blood flow and overall everything appears to be healthy I see no bone exposed and no obvious signs of osteomyelitis. I do not know that he necessarily needs a x-ray at this point although that something we could consider depending on how things progress. The patient does have a history of lymphedema, diabetes, this is type II, chronic kidney disease stage III, hypertension, and history of peripheral vascular disease. 07/05/2020; patient admitted last week. Is a patient I  remember from 2019 he had a spreading infection involving the left foot and we sent him to the hospital. 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 He subsequently had a left femoral to anterior tibial bypass by Dr.Cain vein and vascular. He also has severe lymphedema with chronic skin changes related to that on the left leg. The most problematic area that was new today was on the left medial great toe. This was apparently a small area last week there was purulent drainage which our intake nurse cultured. Also areas on the left medial foot and heel left lateral foot. He has 2 areas on the left medial calf left lateral calf in the setting of the severe lymphedema. 07/13/2020 on evaluation today patient appears to be doing better in my opinion compared to his last visit. The good news is there is no signs of active infection systemically and locally I do not see any signs of infection either. He did have an x-ray which was negative that is great news he had a culture which showed MRSA but at the same time he is been on the doxycycline which has helped. I do think we  may want to extend this for 7 additional days 1/25; patient admitted to the clinic a few weeks ago. 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 We have been putting him under compression his edema control is a lot better but he is severe verricused skin on the left leg. He is really done quite well he still has an open area on the left medial calf and the left medial first metatarsal head. We have been using silver collagen on the leg silver alginate on the foot 07/27/2020 upon evaluation today patient appears to be doing decently well in regard to his wounds. He still has a lot of dry skin on the left leg. Some of this is starting to peel back and I think he may be able to have them out by removing some that today. Fortunately there is no signs of active infection at this time on the left leg although on the right leg he does appear to have swelling and erythema as well as some mild warmth to touch. This does have been concerned about the possibility of cellulitis although within the differential diagnosis I do think that potentially a DVT has to be at least considered. We need to rule that out before proceeding would just call in the cellulitis. Especially since he is having pain in the posterior aspect of his calf muscle. 2/8; the patient had seen sparingly. He has severe skin changes of chronic lymphedema in the left leg thickened hyperkeratotic verrucous skin. He has an open wound on the medial part of the left first met head left mid tibia. He also has a rim of nonepithelialized skin in the anterior mid tibia. He brought in the AmLactin lotion that was been prescribed although I am not sure under compression and its utility. There concern about cellulitis on the right lower leg the last time he was here. He was put on on antibiotics. His DVT rule out was negative. The right leg looks fine he is using his stocking on this area 08/10/2020 upon evaluation  today patient appears to be doing well with regard to his leg currently. He has been tolerating the dressing changes without complication. Fortunately there is no signs of active infection which is great news. Overall very  pleased with where things stand. 2/22; the patient still has an area on the medial part of the left first met his head. This looks better than when I last saw this earlier this month he has a rim of epithelialization but still some surface debris. Mostly everything on the left leg is healed. There is still a vulnerable in the left mid tibia area. 08/30/2020 upon evaluation today patient appears to be doing much better in regard to his wounds on his foot. 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 Nonetheless I think we will address that today I Minna send in a prescription for him in that regard. Overall though there does not appear to be any signs of significant worsening. 09/07/2020 on evaluation today patient's wounds over his left foot appear to be doing excellent. I do not see any signs of infection there is some callus buildup this can require debridement for certain but overall I feel like he is managing quite nicely. He still using the AmLactin cream which has been beneficial for him as well. 3/22; left foot wound is closed. There is no open area here. He is using ammonium lactate lotion to the lower extremities to help exfoliate dry cracked skin. He has compression stockings from [employer] in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 Facility Procedures CPT4 Code: 73710626 Description: 94854 - DEBRIDE WOUND 1ST 20 SQ CM OR < ICD-10 Diagnosis Description 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 of other part of left foot with other specified severi Modifier: ty Quantity: 1 CPT4 Code: 62703500 Description: 97605 - WOUND VAC-50 SQ CM OR LESS Modifier: Quantity: 1 Physician Procedures : CPT4 Code Description Modifier [id number] 93716 - WC PHYS DEBR WO ANESTH 20 SQ CM ICD-10 Diagnosis Description 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 of other part of left foot with other specified severity Quantity: 1 Electronic Signature(s) Signed: 09/01/2021 7:23:59 AM By: [name] Maudlin MD FACS Entered By: [name] on 09/01/2021 07:23:58

## 2021-09-04 ENCOUNTER — Encounter (HOSPITAL_BASED_OUTPATIENT_CLINIC_OR_DEPARTMENT_OTHER): Payer: Medicare Other | Admitting: General Surgery

## 2021-09-04 ENCOUNTER — Other Ambulatory Visit: Payer: Self-pay

## 2021-09-04 DIAGNOSIS — E11621 Type 2 diabetes mellitus with foot ulcer: Secondary | ICD-10-CM | POA: Diagnosis not present

## 2021-09-04 NOTE — Progress Notes (Signed)
MILAS, SCHAPPELL ([id number]) ?Visit Report for 09/04/2021 ?SuperBill Details ?Patient Name: Date of Service: ?[place], [name] 09/04/2021 ?Medical Record Number: [id number] ?Patient Account Number: [id number] ?Date of Birth/Sex: Treating RN: ?[birth year] (71 y.o. 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 ?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 of other part of left foot with other specified severity ?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

## 2021-09-04 NOTE — Progress Notes (Signed)
PARAM, CAPRI ([id number]) Visit Report for 08/31/2021 Arrival Information Details Patient Name: Date of Service: [name], [name] 08/31/2021 10:15 A M Medical Record Number: [id number] Patient Account Number: [id number] Date of Birth/Sex: Treating RN: [birth year] (71 y.o. M) Primary Care Breyon Blass: [name] Other Clinician: Referring Sayda Grable: Treating Madisen Ludvigsen/Extender: [name] in Treatment: 24 Visit Information History Since Last Visit Added or deleted any medications: No Patient Arrived: Ambulatory Any new allergies or adverse reactions: No Arrival Time: 10:21 Had a fall or experienced change in No Accompanied By: self activities of daily living that may affect Transfer Assistance: None risk of falls: Patient Identification Verified: Yes Signs or symptoms of abuse/neglect since last visito No Secondary Verification Process Completed: Yes Hospitalized since last visit: No Patient Requires Transmission-Based Precautions: No Implantable device outside of the clinic excluding No Patient Has Alerts: Yes cellular tissue based products placed in the center Patient Alerts: ABI's: 09/22 L:1.09 R: 1. since last visit: TBI's: R:0.72 L:0.99 Has Dressing in Place as Prescribed: Yes Pain Present Now: No Electronic Signature(s) Signed: 09/04/2021 8:20:16 AM By: Sandre Kitty Entered By: Sandre Kitty on 08/31/2021 10:23:46 -------------------------------------------------------------------------------- Compression Therapy Details Patient Name: Date of Service: [name]. 08/31/2021 10:15 A M Medical Record Number: [id number] Patient Account Number: [id number] Date of Birth/Sex: Treating RN: 02-May-1951 (71 y.o. [name] Primary Care Yaw Escoto: [name] Other Clinician: Referring Shamyra Farias: Treating Chayne Baumgart/Extender: [name] in Treatment: 20 Compression Therapy Performed for Wound Assessment: Wound #22  Left,Lateral Lower Leg Performed By: Clinician Levan Hurst, RN Compression Type: Double Layer Post Procedure Diagnosis Same as Pre-procedure Electronic Signature(s) Signed: 09/01/2021 1:38:56 PM By: Levan Hurst RN, BSN Entered By: Levan Hurst on 08/31/2021 10:57:40 -------------------------------------------------------------------------------- Encounter Discharge Information Details Patient Name: Date of Service: [name]. 08/31/2021 10:15 A M Medical Record Number: [id number] Patient Account Number: [id number] Date of Birth/Sex: Treating RN: 03-30-1951 (71 y.o. [name] Primary Care Atarah Cadogan: [name] Other Clinician: Referring Enez Monahan: Treating Anders Hohmann/Extender: [name] in Treatment: 20 Encounter Discharge Information Items Post Procedure Vitals Discharge Condition: Stable Temperature (F): 98.2 Ambulatory Status: Ambulatory Pulse (bpm): 66 Discharge Destination: Home Respiratory Rate (breaths/min): 20 Transportation: Private Auto Blood Pressure (mmHg): 156/90 Accompanied By: alone Schedule Follow-up Appointment: Yes Clinical Summary of Care: Patient Declined Electronic Signature(s) Signed: 09/01/2021 1:38:56 PM By: Levan Hurst RN, BSN Entered By: Levan Hurst on 08/31/2021 12:16:23 -------------------------------------------------------------------------------- Lower Extremity Assessment Details Patient Name: Date of Service: [name]. 08/31/2021 10:15 A M Medical Record Number: [id number] Patient Account Number: [id number] Date of Birth/Sex: Treating RN: 1950/12/03 (71 y.o. [name] Primary Care Draven Natter: [name] Other Clinician: Referring Eugine Bubb: Treating Aadya Kindler/Extender: [name] in Treatment: 20 Edema Assessment Assessed: [name]: Yes] [name]: No] Edema: [Left: Ye] [Right: s] Calf Left: Right: Point of Measurement: 41 cm From Medial Instep 44.5  cm Ankle Left: Right: Point of Measurement: 10 cm From Medial Instep 28 cm Vascular Assessment Pulses: Dorsalis Pedis Palpable: [Left:Yes] Electronic Signature(s) Signed: 08/31/2021 5:23:31 PM By: Lorrin Jackson Entered By: Lorrin Jackson on 08/31/2021 10:31:33 -------------------------------------------------------------------------------- Multi Wound Chart Details Patient Name: Date of Service: [name]. 08/31/2021 10:15 A M Medical Record Number: [id number] Patient Account Number: [id number] Date of Birth/Sex: Treating RN: [birth year] (71 y.o. M) Primary Care Joshalyn Ancheta: [name] Other Clinician: Referring Nikki Rusnak: Treating Zariah Cavendish/Extender: [name] in Treatment: 20 Vital Signs Height(in): 74 Capillary Blood Glucose(mg/dl): 127 Weight(lbs): 026  Pulse(bpm): 55 Body Mass Index(BMI): 30.6 Blood Pressure(mmHg): 156/90 Temperature(F): 98.2 Respiratory Rate(breaths/min): 20 Photos: [N/A:N/A] Left, Plantar Metatarsal head first Left, Lateral Lower Leg N/A Wound Location: Gradually Appeared Bump N/A Wounding Event: Diabetic Wound/Ulcer of the Lower Cyst N/A Primary Etiology: Extremity Glaucoma, Sleep Apnea, Glaucoma, Sleep Apnea, N/A Comorbid History: Hypertension, Peripheral Arterial Hypertension, Peripheral Arterial Disease, Peripheral Venous Disease,Disease, Peripheral Venous Disease, Type II Diabetes, Gout, Osteoarthritis, Type II Diabetes, Gout, Osteoarthritis, Neuropathy Neuropathy 08/23/2020 06/03/2021 N/A Date Acquired: 20 12 N/A Weeks of Treatment: Open Open N/A Wound Status: No No N/A Wound Recurrence: 1.2x1.4x0.1 1x0.5x0.8 N/A Measurements L x W x D (cm) 1.319 0.393 N/A A (cm) : rea 0.132 0.314 N/A Volume (cm) : 90.70% 76.20% N/A % Reduction in A rea: 95.30% 76.20% N/A % Reduction in Volume: 12 Position 1 (o'clock): 15 Maximum Distance 1 (cm): No Yes N/A Tunneling: Grade 2 Full Thickness With Exposed  Support N/A Classification: Structures Medium Medium N/A Exudate A mount: Serosanguineous Serosanguineous N/A Exudate Type: red, brown red, brown N/A Exudate Color: Well defined, not attached Well defined, not attached N/A Wound Margin: Large (67-100%) Large (67-100%) N/A Granulation A mount: Red Red N/A Granulation Quality: None Present (0%) None Present (0%) N/A Necrotic A mount: Fat Layer (Subcutaneous Tissue): Yes Fat Layer (Subcutaneous Tissue): Yes N/A Exposed Structures: Fascia: No Fascia: No Tendon: No Tendon: No Muscle: No Muscle: No Joint: No Joint: No Bone: No Bone: No Large (67-100%) Small (1-33%) N/A Epithelialization: Debridement - Selective/Open Wound N/A N/A Debridement: Pre-procedure Verification/Time Out 10:52 N/A N/A Taken: Callus N/A N/A Tissue Debrided: Non-Viable Tissue N/A N/A Level: 1.68 N/A N/A Debridement A (sq cm): rea Curette N/A N/A Instrument: Minimum N/A N/A Bleeding: Pressure N/A N/A Hemostasis A chieved: 0 N/A N/A Procedural Pain: 0 N/A N/A Post Procedural Pain: Procedure was tolerated well N/A N/A Debridement Treatment Response: 1.2x1.4x0.1 N/A N/A Post Debridement Measurements L x W x D (cm) 0.132 N/A N/A Post Debridement Volume: (cm) Debridement Compression Therapy N/A Procedures Performed: Negative Pressure Wound Therapy Maintenance (NPWT) Treatment Notes Wound #18 (Metatarsal head first) Wound Laterality: Plantar, Left Cleanser Soap and Water Discharge Instruction: May shower and wash wound with dial antibacterial soap and water prior to dressing change. Wound Cleanser Discharge Instruction: Cleanse the wound with wound cleanser prior to applying a clean dressing using gauze sponges, not tissue or cotton balls. Peri-Wound Care Triamcinolone 15 (g) Discharge Instruction: Use triamcinolone 15 (g) as directed Sween Lotion (Moisturizing lotion) Discharge Instruction: Apply moisturizing lotion as  directed Topical Primary Dressing Hydrofera Blue Ready Foam, 2.5 x2.5 in Discharge Instruction: Apply to wound bed as instructed Secondary Dressing ABD Pad, 5x9 Discharge Instruction: Apply over primary dressing as directed. Secured With Compression Wrap CoFlex TLC XL 2-layer Compression System 4x7 (in/yd) Discharge Instruction: Apply CoFlex 2-layer compression as directed. (alt for 4 layer) Compression Stockings Add-Ons Wound #22 (Lower Leg) Wound Laterality: Left, Lateral Cleanser Soap and Water Discharge Instruction: May shower and wash wound with dial antibacterial soap and water prior to dressing change. Wound Cleanser Discharge Instruction: Cleanse the wound with wound cleanser prior to applying a clean dressing using gauze sponges, not tissue or cotton balls. Peri-Wound Care Triamcinolone 15 (g) Discharge Instruction: Use triamcinolone 15 (g) as directed Sween Lotion (Moisturizing lotion) Discharge Instruction: Apply moisturizing lotion as directed Topical Primary Dressing Secondary Dressing Secured With Compression Wrap CoFlex TLC XL 2-layer Compression System 4x7 (in/yd) Discharge Instruction: Apply CoFlex 2-layer compression as directed. (alt for 4 layer) Compression Stockings Add-Ons Electronic Signature(s) Signed:  09/01/2021 7:14:34 AM By: [name] Maudlin MD FACS Entered By: [name] on 09/01/2021 07:14:34 -------------------------------------------------------------------------------- Multi-Disciplinary Care Plan Details Patient Name: Date of Service: [name]. 08/31/2021 10:15 A M Medical Record Number: [id number] Patient Account Number: [id number] Date of Birth/Sex: Treating RN: Nov 19, 1950 (71 y.o. [name] Primary Care Ayush Boulet: [name] Other Clinician: Referring Junette Bernat: Treating Maebell Lyvers/Extender: [name] in Treatment: 20 Multidisciplinary Care Plan reviewed with physician Active  Inactive Venous Leg Ulcer Nursing Diagnoses: Knowledge deficit related to disease process and management Potential for venous Insuffiency (use before diagnosis confirmed) Goals: Patient will maintain optimal edema control Date Initiated: 07/27/2021 Target Resolution Date: 09/22/2021 Goal Status: Active Interventions: Assess peripheral edema status every visit. Treatment Activities: Therapeutic compression applied : 07/27/2021 Notes: Wound/Skin Impairment Nursing Diagnoses: Impaired tissue integrity Knowledge deficit related to ulceration/compromised skin integrity Goals: Patient will have a decrease in wound volume by X% from date: (specify in notes) Date Initiated: 04/12/2021 Date Inactivated: 04/27/2021 Target Resolution Date: 04/23/2021 Goal Status: Met Patient/caregiver will verbalize understanding of skin care regimen Date Initiated: 04/12/2021 Target Resolution Date: 09/22/2021 Goal Status: Active Ulcer/skin breakdown will have a volume reduction of 30% by week 4 Date Initiated: 04/12/2021 Date Inactivated: 04/27/2021 Target Resolution Date: 04/27/2021 Goal Status: Unmet Unmet Reason: infection Ulcer/skin breakdown will have a volume reduction of 50% by week 8 Date Initiated: 04/27/2021 Date Inactivated: 06/29/2021 Target Resolution Date: 06/24/2021 Goal Status: Met Interventions: Assess patient/caregiver ability to obtain necessary supplies Assess patient/caregiver ability to perform ulcer/skin care regimen upon admission and as needed Assess ulceration(s) every visit Notes: Electronic Signature(s) Signed: 08/31/2021 5:23:31 PM By: Lorrin Jackson Entered By: Lorrin Jackson on 08/31/2021 10:27:49 -------------------------------------------------------------------------------- Negative Pressure Wound Therapy Maintenance (NPWT) Details Patient Name: Date of Service: ALEKAI, POCOCK 08/31/2021 10:15 A M Medical Record Number: [id number] Patient Account Number: [id number] Date  of Birth/Sex: Treating RN: 1950/07/13 (71 y.o. [name] Primary Care Kerrington Sova: [name] Other Clinician: Referring Cayli Escajeda: Treating Evalee Gerard/Extender: [name] in Treatment: 20 NPWT Maintenance Performed for: Wound #22 Left, Lateral Lower Leg Additional Injuries Covered: No Performed By: Levan Hurst, RN Type: VAC System Coverage Size (sq cm): 0.5 Pressure Type: Constant Pressure Setting: 125 mmHG Drain Type: None Sponge/Dressing Type: Gauze Date Initiated: 07/27/2021 Dressing Removed: Yes Quantity of Sponges/Gauze Removed: 1 piece gauze, 1 piece black foam Canister Changed: No Dressing Reapplied: Yes Quantity of Sponges/Gauze Inserted: 1 piece gauze, 1 piece black foam Respones T Treatment: o pt tolerated well Days On NPWT : 36 Post Procedure Diagnosis Same as Pre-procedure Electronic Signature(s) Signed: 09/01/2021 1:38:56 PM By: Levan Hurst RN, BSN Entered By: Levan Hurst on 08/31/2021 10:58:53 -------------------------------------------------------------------------------- Pain Assessment Details Patient Name: Date of Service: [name]. 08/31/2021 10:15 A M Medical Record Number: [id number] Patient Account Number: [id number] Date of Birth/Sex: Treating RN: 13-Sep-1950 (71 y.o. M) Primary Care Ilyanna Baillargeon: [name] Other Clinician: Referring Tresia Revolorio: Treating Dulse Rutan/Extender: [name] in Treatment: 20 Active Problems Location of Pain Severity and Description of Pain Patient Has Paino No Site Locations Pain Management and Medication Current Pain Management: Electronic Signature(s) Signed: 09/04/2021 8:20:16 AM By: Sandre Kitty Entered By: Sandre Kitty on 08/31/2021 10:24:47 -------------------------------------------------------------------------------- Patient/Caregiver Education Details Patient Name: Date of Service: [name], [name] 3/9/2023andnbsp10:15 A M Medical  Record Number: [id number] Patient Account Number: [id number] Date of Birth/Gender: Treating RN: Jul 23, 1950 (71 y.o. [name] Primary Care Physician: [name] Other Clinician: Referring Physician: Treating Physician/Extender: [name]  [name] Weeks in Treatment: 20 Education Assessment Education Provided To: Patient Education Topics Provided Venous: Methods: Explain/Verbal, Printed Responses: State content correctly Wound/Skin Impairment: Methods: Explain/Verbal, Printed Responses: State content correctly Electronic Signature(s) Signed: 08/31/2021 5:23:31 PM By: Lorrin Jackson Entered By: Lorrin Jackson on 08/31/2021 10:28:17 -------------------------------------------------------------------------------- Wound Assessment Details Patient Name: Date of Service: [name]. 08/31/2021 10:15 A M Medical Record Number: [id number] Patient Account Number: [id number] Date of Birth/Sex: Treating RN: 06-21-1951 (71 y.o. M) Primary Care Breonna Gafford: [name] Other Clinician: Referring [name]: Treating Graclyn Lawther/Extender: [name] in Treatment: 20 Wound Status Wound Number: 18 Primary Diabetic Wound/Ulcer of the Lower Extremity Etiology: Wound Location: Left, Plantar Metatarsal head first Wound Open Wounding Event: Gradually Appeared Status: Date Acquired: 08/23/2020 Comorbid Glaucoma, Sleep Apnea, Hypertension, Peripheral Arterial Disease, Weeks Of Treatment: 20 History: Peripheral Venous Disease, Type II Diabetes, Gout, Osteoarthritis, Clustered Wound: No Neuropathy Photos Wound Measurements Length: (cm) 1.2 Width: (cm) 1.4 Depth: (cm) 0.1 Area: (cm) 1.319 Volume: (cm) 0.132 % Reduction in Area: 90.7% % Reduction in Volume: 95.3% Epithelialization: Large (67-100%) Tunneling: No Undermining: No Wound Description Classification: Grade 2 Wound Margin: Well defined, not attached Exudate Amount: Medium Exudate  Type: Serosanguineous Exudate Color: red, brown Foul Odor After Cleansing: No Slough/Fibrino No Wound Bed Granulation Amount: Large (67-100%) Exposed Structure Granulation Quality: Red Fascia Exposed: No Necrotic Amount: None Present (0%) Fat Layer (Subcutaneous Tissue) Exposed: Yes Tendon Exposed: No Muscle Exposed: No Joint Exposed: No Bone Exposed: No Treatment Notes Wound #18 (Metatarsal head first) Wound Laterality: Plantar, Left Cleanser Soap and Water Discharge Instruction: May shower and wash wound with dial antibacterial soap and water prior to dressing change. Wound Cleanser Discharge Instruction: Cleanse the wound with wound cleanser prior to applying a clean dressing using gauze sponges, not tissue or cotton balls. Peri-Wound Care Triamcinolone 15 (g) Discharge Instruction: Use triamcinolone 15 (g) as directed Sween Lotion (Moisturizing lotion) Discharge Instruction: Apply moisturizing lotion as directed Topical Primary Dressing Hydrofera Blue Ready Foam, 2.5 x2.5 in Discharge Instruction: Apply to wound bed as instructed Secondary Dressing ABD Pad, 5x9 Discharge Instruction: Apply over primary dressing as directed. Secured With Compression Wrap CoFlex TLC XL 2-layer Compression System 4x7 (in/yd) Discharge Instruction: Apply CoFlex 2-layer compression as directed. (alt for 4 layer) Compression Stockings Add-Ons Electronic Signature(s) Signed: 08/31/2021 5:23:31 PM By: Lorrin Jackson Entered By: Lorrin Jackson on 08/31/2021 10:32:35 -------------------------------------------------------------------------------- Wound Assessment Details Patient Name: Date of Service: [name]. 08/31/2021 10:15 A M Medical Record Number: [id number] Patient Account Number: [id number] Date of Birth/Sex: Treating RN: May 10, 1951 (71 y.o. M) Primary Care Armas Mcbee: [name] Other Clinician: Referring Mario Coronado: Treating Santiago Graf/Extender: [name] in Treatment: 20 Wound Status Wound Number: 22 Primary Cyst Etiology: Wound Location: Left, Lateral Lower Leg Wound Open Wounding Event: Bump Status: Date Acquired: 06/03/2021 Comorbid Glaucoma, Sleep Apnea, Hypertension, Peripheral Arterial Disease, Weeks Of Treatment: 12 History: Peripheral Venous Disease, Type II Diabetes, Gout, Osteoarthritis, Clustered Wound: No Neuropathy Photos Wound Measurements Length: (cm) 1 Width: (cm) 0.5 Depth: (cm) 0.8 Area: (cm) 0.393 Volume: (cm) 0.314 % Reduction in Area: 76.2% % Reduction in Volume: 76.2% Epithelialization: Small (1-33%) Tunneling: Yes Position (o'clock): 12 Maximum Distance: (cm) 15 Undermining: No Wound Description Classification: Full Thickness With Exposed Support Structures Wound Margin: Well defined, not attached Exudate Amount: Medium Exudate Type: Serosanguineous Exudate Color: red, brown Wound Bed Granulation Amount: Large (67-100%) Granulation Quality: Red Necrotic Amount: None Present (0%) Foul Odor After Cleansing: No Slough/Fibrino  No Exposed Structure Fascia Exposed: No Fat Layer (Subcutaneous Tissue) Exposed: Yes Tendon Exposed: No Muscle Exposed: No Joint Exposed: No Bone Exposed: No Treatment Notes Wound #22 (Lower Leg) Wound Laterality: Left, Lateral Cleanser Soap and Water Discharge Instruction: May shower and wash wound with dial antibacterial soap and water prior to dressing change. Wound Cleanser Discharge Instruction: Cleanse the wound with wound cleanser prior to applying a clean dressing using gauze sponges, not tissue or cotton balls. Peri-Wound Care Triamcinolone 15 (g) Discharge Instruction: Use triamcinolone 15 (g) as directed Sween Lotion (Moisturizing lotion) Discharge Instruction: Apply moisturizing lotion as directed Topical Primary Dressing Secondary Dressing Secured With Compression Wrap CoFlex TLC XL 2-layer Compression System 4x7 (in/yd) Discharge  Instruction: Apply CoFlex 2-layer compression as directed. (alt for 4 layer) Compression Stockings Add-Ons Electronic Signature(s) Signed: 08/31/2021 5:23:31 PM By: Lorrin Jackson Entered By: Lorrin Jackson on 08/31/2021 10:33:25 -------------------------------------------------------------------------------- Vitals Details Patient Name: Date of Service: [name]. 08/31/2021 10:15 A M Medical Record Number: [id number] Patient Account Number: [id number] Date of Birth/Sex: Treating RN: 1951/03/21 (71 y.o. M) Primary Care Lillieanna Tuohy: [name] Other Clinician: Referring Kaylub Detienne: Treating Limmie Schoenberg/Extender: [name] in Treatment: 20 Vital Signs Time Taken: 10:23 Temperature (F): 98.2 Height (in): 74 Pulse (bpm): 66 Weight (lbs): 238 Respiratory Rate (breaths/min): 20 Body Mass Index (BMI): 30.6 Blood Pressure (mmHg): 156/90 Capillary Blood Glucose (mg/dl): 127 Reference Range: 80 - 120 mg / dl Electronic Signature(s) Signed: 09/04/2021 8:20:16 AM By: Sandre Kitty Entered By: Sandre Kitty on 08/31/2021 10:24:40

## 2021-09-04 NOTE — Progress Notes (Signed)
[name], [name] ([id number]) Visit Report for 09/04/2021 Arrival Information Details Patient Name: Date of Service: [name], [name] 09/04/2021 10:30 A M Medical Record Number: [id number] Patient Account Number: [id number] Date of Birth/Sex: Treating RN: February 13, 1951 (71 y.o. [name] Primary Care [name]: [name] Other Clinician: Referring [name]: Treating [name]/Extender: [name] in Treatment: 12 Visit Information History Since Last Visit Added or deleted any medications: No Patient Arrived: Ambulatory Any new allergies or adverse reactions: No Arrival Time: 10:28 Had a fall or experienced change in No Transfer Assistance: None activities of daily living that may affect Patient Identification Verified: Yes risk of falls: Secondary Verification Process Completed: Yes Signs or symptoms of abuse/neglect since last visito No Patient Requires Transmission-Based Precautions: No Hospitalized since last visit: No Patient Has Alerts: Yes Implantable device outside of the clinic excluding No Patient Alerts: ABI's: 09/22 L:1.09 R: 1. cellular tissue based products placed in the center TBI's: R:0.72 L:0.99 since last visit: Has Dressing in Place as Prescribed: Yes Has Compression in Place as Prescribed: Yes Pain Present Now: No Electronic Signature(s) Signed: 09/04/2021 5:18:59 PM By: Lorrin Jackson Entered By: Lorrin Jackson on 09/04/2021 10:28:46 -------------------------------------------------------------------------------- Compression Therapy Details Patient Name: Date of Service: [name]. 09/04/2021 10:30 A M Medical Record Number: [id number] Patient Account Number: [id number] Date of Birth/Sex: Treating RN: 1950-07-19 (71 y.o. [name] Primary Care [name]: [name] Other Clinician: Referring Ahmon Tosi: Treating [name]/Extender: [name] in Treatment: 20 Compression Therapy Performed  for Wound Assessment: Wound #18 Left,Plantar Metatarsal head first Performed By: Clinician Lorrin Jackson, RN Compression Type: Double Layer Electronic Signature(s) Signed: 09/04/2021 5:18:59 PM By: Lorrin Jackson Entered By: Lorrin Jackson on 09/04/2021 10:36:55 -------------------------------------------------------------------------------- Encounter Discharge Information Details Patient Name: Date of Service: [name]. 09/04/2021 10:30 A M Medical Record Number: [id number] Patient Account Number: [id number] Date of Birth/Sex: Treating RN: [birth year] (71 y.o. [name] Primary Care [name]: [name] Other Clinician: Referring Rosibel Giacobbe: Treating [name]/Extender: [name] in Treatment: 20 Encounter Discharge Information Items Discharge Condition: Stable Ambulatory Status: Ambulatory Discharge Destination: Home Transportation: Private Auto Schedule Follow-up Appointment: Yes Clinical Summary of Care: Patient Declined Electronic Signature(s) Signed: 09/04/2021 11:09:05 AM By: Lorrin Jackson Entered By: Lorrin Jackson on 09/04/2021 11:09:05 -------------------------------------------------------------------------------- Negative Pressure Wound Therapy Maintenance (NPWT) Details Patient Name: Date of Service: [name] 09/04/2021 10:30 A M Medical Record Number: [id number] Patient Account Number: [id number] Date of Birth/Sex: Treating RN: [birth year] (71 y.o. [name] Primary Care [name]: [name] Other Clinician: Referring [name]: Treating [name]/Extender: [name] in Treatment: 20 NPWT Maintenance Performed for: Wound #22 Left, Lateral Lower Leg Additional Injuries Covered: No Performed By: Lorrin Jackson, RN Type: VAC System Coverage Size (sq cm): 0.5 Pressure Type: Constant Pressure Setting: 125 mmHG Drain Type: None Sponge/Dressing Type: Gauze Date Initiated:  07/27/2021 Dressing Removed: Yes Canister Changed: No Dressing Reapplied: Yes Days On NPWT : 40 Electronic Signature(s) Signed: 09/04/2021 5:18:59 PM By: Lorrin Jackson Entered By: Lorrin Jackson on 09/04/2021 10:36:26 -------------------------------------------------------------------------------- Patient/Caregiver Education Details Patient Name: Date of Service: [name] 3/13/2023andnbsp10:30 [place] Record Number: [id number] Patient Account Number: [id number] Date of Birth/Gender: Treating RN: 03-Feb-1951 (71 y.o. [name] Primary Care Physician: [name] Other Clinician: Referring Physician: Treating Physician/Extender: [name] in Treatment: 20 Education Assessment Education Provided To: Patient Education Topics Provided Venous: Methods: Explain/Verbal Responses: State content correctly Wound/Skin Impairment:  Methods: Explain/Verbal Responses: State content correctly Electronic Signature(s) Signed: 09/04/2021 5:18:59 PM By: Lorrin Jackson Entered By: Lorrin Jackson on 09/04/2021 11:08:47 -------------------------------------------------------------------------------- Wound Assessment Details Patient Name: Date of Service: [name]. 09/04/2021 10:30 A M Medical Record Number: [id number] Patient Account Number: [id number] Date of Birth/Sex: Treating RN: Jan 16, 1951 (71 y.o. [name] Primary Care [name]: [name] Other Clinician: Referring [name]: Treating [name]/Extender: [name] in Treatment: 20 Wound Status Wound Number: 18 Primary Diabetic Wound/Ulcer of the Lower Extremity Etiology: Wound Location: Left, Plantar Metatarsal head first Wound Open Wounding Event: Gradually Appeared Status: Date Acquired: 08/23/2020 Comorbid Glaucoma, Sleep Apnea, Hypertension, Peripheral Arterial Disease, Weeks Of Treatment: 20 History: Peripheral Venous Disease, Type II  Diabetes, Gout, Osteoarthritis, Clustered Wound: No Neuropathy Wound Measurements Length: (cm) 1.2 Width: (cm) 1.4 Depth: (cm) 0.1 Area: (cm) 1.319 Volume: (cm) 0.132 % Reduction in Area: 90.7% % Reduction in Volume: 95.3% Epithelialization: Large (67-100%) Tunneling: No Undermining: No Wound Description Classification: Grade 2 Wound Margin: Well defined, not attached Exudate Amount: Medium Exudate Type: Serosanguineous Exudate Color: red, brown Foul Odor After Cleansing: No Slough/Fibrino No Wound Bed Granulation Amount: Large (67-100%) Exposed Structure Granulation Quality: Red Fascia Exposed: No Necrotic Amount: None Present (0%) Fat Layer (Subcutaneous Tissue) Exposed: Yes Tendon Exposed: No Muscle Exposed: No Joint Exposed: No Bone Exposed: No Assessment Notes calloused periwound Treatment Notes Wound #18 (Metatarsal head first) Wound Laterality: Plantar, Left Cleanser Soap and Water Discharge Instruction: May shower and wash wound with dial antibacterial soap and water prior to dressing change. Wound Cleanser Discharge Instruction: Cleanse the wound with wound cleanser prior to applying a clean dressing using gauze sponges, not tissue or cotton balls. Peri-Wound Care Triamcinolone 15 (g) Discharge Instruction: Use triamcinolone 15 (g) as directed Sween Lotion (Moisturizing lotion) Discharge Instruction: Apply moisturizing lotion as directed Topical Primary Dressing Hydrofera Blue Ready Foam, 2.5 x2.5 in Discharge Instruction: Apply to wound bed as instructed Secondary Dressing ABD Pad, 5x9 Discharge Instruction: Apply over primary dressing as directed. Secured With Compression Wrap CoFlex TLC XL 2-layer Compression System 4x7 (in/yd) Discharge Instruction: Apply CoFlex 2-layer compression as directed. (alt for 4 layer) Compression Stockings Add-Ons Electronic Signature(s) Signed: 09/04/2021 5:18:59 PM By: Lorrin Jackson Entered By: Lorrin Jackson on  09/04/2021 10:33:48 -------------------------------------------------------------------------------- Wound Assessment Details Patient Name: Date of Service: [name]. 09/04/2021 10:30 A M Medical Record Number: [id number] Patient Account Number: [id number] Date of Birth/Sex: Treating RN: [birth year] (71 y.o. [name] Primary Care Audine Mangione: [name] Other Clinician: Referring Stanisha Lorenz: Treating Allanah Mcfarland/Extender: [name] in Treatment: 20 Wound Status Wound Number: 22 Primary Cyst Etiology: Wound Location: Left, Lateral Lower Leg Wound Open Wounding Event: Bump Status: Date Acquired: 06/03/2021 Comorbid Glaucoma, Sleep Apnea, Hypertension, Peripheral Arterial Disease, Weeks Of Treatment: 13 History: Peripheral Venous Disease, Type II Diabetes, Gout, Osteoarthritis, Clustered Wound: No Neuropathy Wound Measurements Length: (cm) 1 Width: (cm) 0.5 Depth: (cm) 0.8 Area: (cm) 0.393 Volume: (cm) 0.314 % Reduction in Area: 76.2% % Reduction in Volume: 76.2% Epithelialization: Medium (34-66%) Tunneling: Yes Position (o'clock): 12 Maximum Distance: (cm) 15 Undermining: No Wound Description Classification: Full Thickness With Exposed Support Structures Wound Margin: Well defined, not attached Exudate Amount: Medium Exudate Type: Serosanguineous Exudate Color: red, brown Foul Odor After Cleansing: No Slough/Fibrino No Wound Bed Granulation Amount: Large (67-100%) Exposed Structure Granulation Quality: Red Fascia Exposed: No Necrotic Amount: None Present (0%) Fat Layer (Subcutaneous Tissue) Exposed: Yes Tendon Exposed: No Muscle Exposed: No Joint Exposed:  No Bone Exposed: No Treatment Notes Wound #22 (Lower Leg) Wound Laterality: Left, Lateral Cleanser Soap and Water Discharge Instruction: May shower and wash wound with dial antibacterial soap and water prior to dressing change. Wound Cleanser Discharge Instruction:  Cleanse the wound with wound cleanser prior to applying a clean dressing using gauze sponges, not tissue or cotton balls. Peri-Wound Care Topical Primary Dressing Secondary Dressing Secured With Compression Wrap CoFlex TLC XL 2-layer Compression System 4x7 (in/yd) Discharge Instruction: Apply CoFlex 2-layer compression as directed. (alt for 4 layer) Compression Stockings Add-Ons Electronic Signature(s) Signed: 09/04/2021 5:18:59 PM By: Lorrin Jackson Entered By: Lorrin Jackson on 09/04/2021 10:34:56 -------------------------------------------------------------------------------- Vitals Details Patient Name: Date of Service: [name]. 09/04/2021 10:30 A M Medical Record Number: [id number] Patient Account Number: [id number] Date of Birth/Sex: Treating RN: 1950/09/07 (70 y.o. [name] Primary Care Jalyn Rosero: [name] Other Clinician: Referring Alahia Whicker: Treating Esiah Bazinet/Extender: [name] in Treatment: 20 Vital Signs Time Taken: 10:28 Temperature (F): 98.3 Height (in): 74 Pulse (bpm): 73 Weight (lbs): 238 Respiratory Rate (breaths/min): 18 Body Mass Index (BMI): 30.6 Blood Pressure (mmHg): 149/86 Capillary Blood Glucose (mg/dl): 139 Reference Range: 80 - 120 mg / dl Electronic Signature(s) Signed: 09/04/2021 5:18:59 PM By: Lorrin Jackson Entered By: Lorrin Jackson on 09/04/2021 10:29:47

## 2021-09-06 ENCOUNTER — Other Ambulatory Visit: Payer: Self-pay

## 2021-09-06 ENCOUNTER — Ambulatory Visit (INDEPENDENT_AMBULATORY_CARE_PROVIDER_SITE_OTHER): Payer: Medicare Other | Admitting: Vascular Surgery

## 2021-09-06 ENCOUNTER — Encounter: Payer: Self-pay | Admitting: Vascular Surgery

## 2021-09-06 VITALS — BP 133/74 | HR 62 | Temp 98.2°F | Resp 20 | Ht 73.5 in | Wt 270.0 lb

## 2021-09-06 DIAGNOSIS — S81802D Unspecified open wound, left lower leg, subsequent encounter: Secondary | ICD-10-CM
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
## 2021-09-07 ENCOUNTER — Other Ambulatory Visit: Payer: Self-pay

## 2021-09-07 ENCOUNTER — Encounter (HOSPITAL_BASED_OUTPATIENT_CLINIC_OR_DEPARTMENT_OTHER): Payer: Medicare Other | Admitting: General Surgery

## 2021-09-07 ENCOUNTER — Encounter (HOSPITAL_COMMUNITY): Payer: Self-pay | Admitting: Vascular Surgery

## 2021-09-07 MED ORDER — VANCOMYCIN HCL 1500 MG/300ML IV SOLN: 1500.0000 mg | INTRAVENOUS | Status: DC

## 2021-09-07 MED ORDER — VANCOMYCIN HCL 1500 MG/300ML IV SOLN
1500.0000 mg | INTRAVENOUS | Status: DC
  Filled 2021-09-07: qty 300

## 2021-09-07 NOTE — Progress Notes (Signed)
PCP - Dr. [name]  ?Cardiologist -  ?EKG - 06/11/21 ?Chest x-ray -  ?ECHO -  ?Cardiac Cath -  ?CPAP -  ?Fasting Blood Sugar:  200s ?Checks Blood Sugar:  3-4x/day (DEXCOM)  ?Blood Thinner Instructions: per pt, stopped plavix 3/15 for colonscopy scheduled next tues per MD. Dr. Donzetta Matters aware of this.  ?Aspirin Instructions:  ?ERAS Protcol - n/a ?COVID TEST- n/a ? ?Anesthesia review: yes ? ?------------- ? ?SDW INSTRUCTIONS: ? ?Your procedure is scheduled on Friday 3/17. Please report to [facility] Main Entrance "A" at [email or web address] and check in at the Admitting office. Call this number if you have problems the morning of surgery: [phone] ? ? ?Remember: Do not eat or drink after midnight the night before your surgery ?  ?Medications to take morning of surgery with a sip of water include: ?-pt does not plan to take any medications DOS  ? ?As of today, STOP taking any Aspirin (unless otherwise instructed by your surgeon), Aleve, Naproxen, Ibuprofen, Motrin, Advil, Goody's, BC's, all herbal medications, fish oil, and all vitamins. ? ?** PLEASE check your blood sugar the morning of your surgery when you wake up and every 2 hours until you get to the Short Stay unit. ? ?If your blood sugar is less than 70 mg/dL, you will need to treat for low blood sugar: ?Do not take insulin. ?Treat a low blood sugar (less than 70 mg/dL) with ? cup of clear juice (cranberry or apple), 4 glucose tablets, OR glucose gel. ?Recheck blood sugar in 15 minutes after treatment (to make sure it is greater than 70 mg/dL). If your blood sugar is not greater than 70 mg/dL on recheck, call [phone] for further instructions. ? ?- pt does not plan to wear insulin pump to hospital  ? ?The Morning of Surgery ?Do not wear jewelry ?Do not wear lotions, powders, colognes, or deodorant ?Do not bring valuables to the hospital. ?[facility] is not responsible for any belongings or valuables. ? ?If you are a smoker, DO NOT Smoke 24 hours prior to surgery ? ?If  you wear a CPAP at night please bring your mask the morning of surgery  ? ?Remember that you must have someone to transport you home after your surgery, and remain with you for 24 hours if you are discharged the same day. ? ?Please bring cases for contacts, glasses, hearing aids, dentures or bridgework because it cannot be worn into surgery.  ? ?Patients discharged the day of surgery will not be allowed to drive home.  ? ?Please shower the NIGHT BEFORE/MORNING OF SURGERY (use antibacterial soap like DIAL soap if possible). Wear comfortable clothes the morning of surgery. Oral Hygiene is also important to reduce your risk of infection.  Remember - BRUSH YOUR TEETH THE MORNING OF SURGERY WITH YOUR REGULAR TOOTHPASTE ? ?Patient denies shortness of breath, fever, cough and chest pain.  ? ? ?   ? ?

## 2021-09-07 NOTE — Progress Notes (Signed)
Per pt, he will bring his own wound vac and dressings to be used in OR/PACU for discharge d/t last experience he waited too long for them to find supplies. Pt informed that he can speak with OR team and Dr. Donzetta Matters about this DOS. Per pt, he will also be removing his insulin pump prior to arrival at hospital. He did not want to worry about reducing basal rates. Pt states he has his dexcom to check CBG when insulin pump has been removed.  ?

## 2021-09-07 NOTE — Progress Notes (Signed)
Pt had "dilation and curettage of uterus" listed under medical hx. Per pt, born male, identify as male, would like for hx to be removed and corrected in chart. "Dilation and curettage of uterus" hx removed from pt chart. Confirmed with Willeen Cass, NP ?

## 2021-09-08 ENCOUNTER — Other Ambulatory Visit: Payer: Self-pay

## 2021-09-08 ENCOUNTER — Ambulatory Visit (HOSPITAL_BASED_OUTPATIENT_CLINIC_OR_DEPARTMENT_OTHER): Payer: Medicare Other | Admitting: Emergency Medicine

## 2021-09-08 ENCOUNTER — Ambulatory Visit (HOSPITAL_COMMUNITY): Payer: Medicare Other | Admitting: Emergency Medicine

## 2021-09-08 ENCOUNTER — Encounter (HOSPITAL_COMMUNITY): Payer: Self-pay | Admitting: Vascular Surgery

## 2021-09-08 ENCOUNTER — Ambulatory Visit (HOSPITAL_COMMUNITY)
Admission: RE | Admit: 2021-09-08 | Discharge: 2021-09-08 | Disposition: A | Discharge status: Home / Self Care | Payer: Medicare Other | Attending: Vascular Surgery | Admitting: Vascular Surgery

## 2021-09-08 ENCOUNTER — Encounter (HOSPITAL_COMMUNITY)
Admission: RE | Disposition: A | Discharge status: Home / Self Care | Payer: Self-pay | Source: Home / Self Care | Attending: Vascular Surgery

## 2021-09-08 DIAGNOSIS — Z9641 Presence of insulin pump (external) (internal): Secondary | ICD-10-CM | POA: Diagnosis not present

## 2021-09-08 DIAGNOSIS — E1151 Type 2 diabetes mellitus with diabetic peripheral angiopathy without gangrene: Secondary | ICD-10-CM

## 2021-09-08 DIAGNOSIS — Z87891 Personal history of nicotine dependence: Secondary | ICD-10-CM | POA: Insufficient documentation

## 2021-09-08 DIAGNOSIS — T8189XA Other complications of procedures, not elsewhere classified, initial encounter: Secondary | ICD-10-CM | POA: Insufficient documentation

## 2021-09-08 DIAGNOSIS — E1142 Type 2 diabetes mellitus with diabetic polyneuropathy: Secondary | ICD-10-CM | POA: Insufficient documentation

## 2021-09-08 DIAGNOSIS — F32A Depression, unspecified: Secondary | ICD-10-CM | POA: Diagnosis not present

## 2021-09-08 DIAGNOSIS — I129 Hypertensive chronic kidney disease with stage 1 through stage 4 chronic kidney disease, or unspecified chronic kidney disease: Secondary | ICD-10-CM | POA: Insufficient documentation

## 2021-09-08 DIAGNOSIS — T8189XD Other complications of procedures, not elsewhere classified, subsequent encounter: Secondary | ICD-10-CM

## 2021-09-08 DIAGNOSIS — I10 Essential (primary) hypertension: Secondary | ICD-10-CM

## 2021-09-08 DIAGNOSIS — I739 Peripheral vascular disease, unspecified: Secondary | ICD-10-CM

## 2021-09-08 DIAGNOSIS — Z794 Long term (current) use of insulin: Secondary | ICD-10-CM | POA: Insufficient documentation

## 2021-09-08 DIAGNOSIS — M1990 Unspecified osteoarthritis, unspecified site: Secondary | ICD-10-CM | POA: Diagnosis not present

## 2021-09-08 DIAGNOSIS — Y838 Other surgical procedures as the cause of abnormal reaction of the patient, or of later complication, without mention of misadventure at the time of the procedure: Secondary | ICD-10-CM | POA: Insufficient documentation

## 2021-09-08 DIAGNOSIS — G4733 Obstructive sleep apnea (adult) (pediatric): Secondary | ICD-10-CM | POA: Diagnosis not present

## 2021-09-08 DIAGNOSIS — N183 Chronic kidney disease, stage 3 unspecified: Secondary | ICD-10-CM | POA: Insufficient documentation

## 2021-09-08 DIAGNOSIS — S81809A Unspecified open wound, unspecified lower leg, initial encounter: Secondary | ICD-10-CM

## 2021-09-08 DIAGNOSIS — Z79899 Other long term (current) drug therapy: Secondary | ICD-10-CM | POA: Diagnosis not present

## 2021-09-08 DIAGNOSIS — S81802A Unspecified open wound, left lower leg, initial encounter: Secondary | ICD-10-CM | POA: Diagnosis not present

## 2021-09-08 DIAGNOSIS — E1122 Type 2 diabetes mellitus with diabetic chronic kidney disease: Secondary | ICD-10-CM | POA: Insufficient documentation

## 2021-09-08 DIAGNOSIS — D631 Anemia in chronic kidney disease: Secondary | ICD-10-CM | POA: Insufficient documentation

## 2021-09-08 HISTORY — PX: APPLICATION OF WOUND VAC: SHX5189

## 2021-09-08 HISTORY — PX: I & D EXTREMITY: SHX5045

## 2021-09-08 LAB — POCT I-STAT, CHEM 8
BUN: 27 mg/dL — ABNORMAL HIGH (ref 8–23)
Calcium, Ion: 1.26 mmol/L (ref 1.15–1.40)
Chloride: 106 mmol/L (ref 98–111)
Creatinine, Ser: 1.7 mg/dL — ABNORMAL HIGH (ref 0.61–1.24)
Glucose, Bld: 71 mg/dL (ref 70–99)
HCT: 41 % (ref 39.0–52.0)
Hemoglobin: 13.9 g/dL (ref 13.0–17.0)
Potassium: 4.3 mmol/L (ref 3.5–5.1)
Sodium: 142 mmol/L (ref 135–145)
TCO2: 24 mmol/L (ref 22–32)

## 2021-09-08 LAB — GLUCOSE, CAPILLARY
Glucose-Capillary: 77 mg/dL (ref 70–99)
Glucose-Capillary: 61 mg/dL — ABNORMAL LOW (ref 70–99)
Glucose-Capillary: 82 mg/dL (ref 70–99)
Glucose-Capillary: 89 mg/dL (ref 70–99)

## 2021-09-08 SURGERY — IRRIGATION AND DEBRIDEMENT EXTREMITY
Anesthesia: General | Laterality: Left

## 2021-09-08 MED ORDER — ACETAMINOPHEN 500 MG PO TABS
ORAL_TABLET | ORAL | Status: AC
  Filled 2021-09-08: qty 2

## 2021-09-08 MED ORDER — LIDOCAINE 2% (20 MG/ML) 5 ML SYRINGE
INTRAMUSCULAR | Status: AC
  Filled 2021-09-08: qty 5

## 2021-09-08 MED ORDER — EPHEDRINE 5 MG/ML INJ
INTRAVENOUS | Status: AC
  Filled 2021-09-08: qty 5

## 2021-09-08 MED ORDER — HYDROMORPHONE HCL 1 MG/ML IJ SOLN: 0.2500 mg | INTRAMUSCULAR | Status: DC | PRN

## 2021-09-08 MED ORDER — PHENYLEPHRINE HCL-NACL 20-0.9 MG/250ML-% IV SOLN
INTRAVENOUS | Status: DC | PRN
  Administered 2021-09-08: 25 ug/min via INTRAVENOUS

## 2021-09-08 MED ORDER — SODIUM CHLORIDE 0.9 % IV SOLN: INTRAVENOUS | Status: DC

## 2021-09-08 MED ORDER — PHENYLEPHRINE 40 MCG/ML (10ML) SYRINGE FOR IV PUSH (FOR BLOOD PRESSURE SUPPORT)
PREFILLED_SYRINGE | INTRAVENOUS | Status: DC | PRN
  Administered 2021-09-08: 40 ug via INTRAVENOUS
  Administered 2021-09-08 (×2): 80 ug via INTRAVENOUS
  Administered 2021-09-08: 120 ug via INTRAVENOUS

## 2021-09-08 MED ORDER — CEFAZOLIN IN SODIUM CHLORIDE 3-0.9 GM/100ML-% IV SOLN
3.0000 g | INTRAVENOUS | Status: AC
  Administered 2021-09-08: 3 g via INTRAVENOUS
  Filled 2021-09-08: qty 100

## 2021-09-08 MED ORDER — ONDANSETRON HCL 4 MG/2ML IJ SOLN
INTRAMUSCULAR | Status: AC
  Filled 2021-09-08: qty 2

## 2021-09-08 MED ORDER — DEXAMETHASONE SODIUM PHOSPHATE 10 MG/ML IJ SOLN
INTRAMUSCULAR | Status: AC
  Filled 2021-09-08: qty 1

## 2021-09-08 MED ORDER — FENTANYL CITRATE (PF) 250 MCG/5ML IJ SOLN
INTRAMUSCULAR | Status: DC | PRN
  Administered 2021-09-08: 50 ug via INTRAVENOUS

## 2021-09-08 MED ORDER — CHLORHEXIDINE GLUCONATE 4 % EX LIQD: 60.0000 mL | Freq: Once | CUTANEOUS | Status: DC

## 2021-09-08 MED ORDER — MIDAZOLAM HCL 2 MG/2ML IJ SOLN
INTRAMUSCULAR | Status: AC
  Filled 2021-09-08: qty 2

## 2021-09-08 MED ORDER — FENTANYL CITRATE (PF) 250 MCG/5ML IJ SOLN
INTRAMUSCULAR | Status: AC
  Filled 2021-09-08: qty 5

## 2021-09-08 MED ORDER — PHENYLEPHRINE 40 MCG/ML (10ML) SYRINGE FOR IV PUSH (FOR BLOOD PRESSURE SUPPORT)
PREFILLED_SYRINGE | INTRAVENOUS | Status: AC
  Filled 2021-09-08: qty 10

## 2021-09-08 MED ORDER — LIDOCAINE 2% (20 MG/ML) 5 ML SYRINGE
INTRAMUSCULAR | Status: DC | PRN
  Administered 2021-09-08: 100 mg via INTRAVENOUS

## 2021-09-08 MED ORDER — DROPERIDOL 2.5 MG/ML IJ SOLN: 0.6250 mg | Freq: Once | INTRAMUSCULAR | Status: DC | PRN

## 2021-09-08 MED ORDER — ONDANSETRON HCL 4 MG/2ML IJ SOLN
INTRAMUSCULAR | Status: DC | PRN
Start: 2021-09-08 — End: 2021-09-08
  Administered 2021-09-08: 4 mg via INTRAVENOUS

## 2021-09-08 MED ORDER — EPHEDRINE SULFATE-NACL 50-0.9 MG/10ML-% IV SOSY
PREFILLED_SYRINGE | INTRAVENOUS | Status: DC | PRN
  Administered 2021-09-08: 5 mg via INTRAVENOUS
  Administered 2021-09-08: 10 mg via INTRAVENOUS
  Administered 2021-09-08 (×2): 5 mg via INTRAVENOUS

## 2021-09-08 MED ORDER — CHLORHEXIDINE GLUCONATE 0.12 % MT SOLN
OROMUCOSAL | Status: AC
  Administered 2021-09-08: 15 mL
  Filled 2021-09-08: qty 15

## 2021-09-08 MED ORDER — ACETAMINOPHEN 500 MG PO TABS
1000.0000 mg | ORAL_TABLET | Freq: Once | ORAL | Status: AC
  Administered 2021-09-08: 1000 mg via ORAL

## 2021-09-08 MED ORDER — INSULIN ASPART 100 UNIT/ML IJ SOLN: 0.0000 [IU] | INTRAMUSCULAR | Status: DC | PRN

## 2021-09-08 MED ORDER — 0.9 % SODIUM CHLORIDE (POUR BTL) OPTIME
TOPICAL | Status: DC | PRN
  Administered 2021-09-08: 1000 mL

## 2021-09-08 MED ORDER — MIDAZOLAM HCL 5 MG/5ML IJ SOLN
INTRAMUSCULAR | Status: DC | PRN
  Administered 2021-09-08: 2 mg via INTRAVENOUS

## 2021-09-08 MED ORDER — DEXAMETHASONE SODIUM PHOSPHATE 10 MG/ML IJ SOLN
INTRAMUSCULAR | Status: DC | PRN
  Administered 2021-09-08: 10 mg via INTRAVENOUS

## 2021-09-08 MED ORDER — PROPOFOL 10 MG/ML IV BOLUS
INTRAVENOUS | Status: DC | PRN
  Administered 2021-09-08: 160 mg via INTRAVENOUS

## 2021-09-08 SURGICAL SUPPLY — 44 items
BAG COUNTER SPONGE SURGICOUNT (BAG) ×2 IMPLANT
BNDG COHESIVE 6X5 TAN NS LF (GAUZE/BANDAGES/DRESSINGS) ×1 IMPLANT
BNDG ELASTIC 4X5.8 VLCR STR LF (GAUZE/BANDAGES/DRESSINGS) IMPLANT
BNDG ELASTIC 6X10 VLCR STRL LF (GAUZE/BANDAGES/DRESSINGS) ×1 IMPLANT
BNDG ELASTIC 6X5.8 VLCR STR LF (GAUZE/BANDAGES/DRESSINGS) IMPLANT
BNDG GAUZE ELAST 4 BULKY (GAUZE/BANDAGES/DRESSINGS) IMPLANT
CANISTER SUCT 3000ML PPV (MISCELLANEOUS) ×4 IMPLANT
CLIP LIGATING EXTRA MED SLVR (CLIP) ×1 IMPLANT
CLIP LIGATING EXTRA SM BLUE (MISCELLANEOUS) ×1 IMPLANT
COVER SURGICAL LIGHT HANDLE (MISCELLANEOUS) ×2 IMPLANT
DRAPE EXTREMITY T 121X128X90 (DISPOSABLE) IMPLANT
DRAPE HALF SHEET 40X57 (DRAPES) ×1 IMPLANT
DRAPE INCISE IOBAN 66X45 STRL (DRAPES) IMPLANT
DRAPE ORTHO SPLIT 77X108 STRL (DRAPES)
DRAPE SURG ORHT 6 SPLT 77X108 (DRAPES) IMPLANT
DRSG ADAPTIC 3X8 NADH LF (GAUZE/BANDAGES/DRESSINGS) IMPLANT
DRSG VAC ATS LRG SENSATRAC (GAUZE/BANDAGES/DRESSINGS) IMPLANT
DRSG VAC ATS MED SENSATRAC (GAUZE/BANDAGES/DRESSINGS) IMPLANT
ELECT REM PT RETURN 9FT ADLT (ELECTROSURGICAL) ×2
ELECTRODE REM PT RTRN 9FT ADLT (ELECTROSURGICAL) ×1 IMPLANT
GAUZE SPONGE 4X4 12PLY STRL LF (GAUZE/BANDAGES/DRESSINGS) ×2 IMPLANT
GLOVE SURG ENC MOIS LTX SZ7.5 (GLOVE) ×2 IMPLANT
GOWN STRL REUS W/ TWL LRG LVL3 (GOWN DISPOSABLE) ×3 IMPLANT
GOWN STRL REUS W/ TWL XL LVL3 (GOWN DISPOSABLE) ×1 IMPLANT
GOWN STRL REUS W/TWL LRG LVL3 (GOWN DISPOSABLE) ×6
GOWN STRL REUS W/TWL XL LVL3 (GOWN DISPOSABLE) ×2
HANDPIECE INTERPULSE COAX TIP (DISPOSABLE)
KIT BASIN OR (CUSTOM PROCEDURE TRAY) ×2 IMPLANT
KIT TURNOVER KIT B (KITS) ×2 IMPLANT
NS IRRIG 1000ML POUR BTL (IV SOLUTION) ×2 IMPLANT
PACK GENERAL/GYN (CUSTOM PROCEDURE TRAY) ×2 IMPLANT
PACK UNIVERSAL I (CUSTOM PROCEDURE TRAY) IMPLANT
PAD ARMBOARD 7.5X6 YLW CONV (MISCELLANEOUS) ×4 IMPLANT
PAD NEG PRESSURE SENSATRAC (MISCELLANEOUS) ×2 IMPLANT
SET HNDPC FAN SPRY TIP SCT (DISPOSABLE) IMPLANT
SUT ETHILON 3 0 PS 1 (SUTURE) IMPLANT
SUT VIC AB 2-0 CT1 27 (SUTURE)
SUT VIC AB 2-0 CT1 TAPERPNT 27 (SUTURE) IMPLANT
SUT VIC AB 3-0 SH 27 (SUTURE)
SUT VIC AB 3-0 SH 27X BRD (SUTURE) IMPLANT
SUT VICRYL 4-0 PS2 18IN ABS (SUTURE) IMPLANT
TOWEL GREEN STERILE (TOWEL DISPOSABLE) ×4 IMPLANT
TOWEL GREEN STERILE FF (TOWEL DISPOSABLE) ×2 IMPLANT
WATER STERILE IRR 1000ML POUR (IV SOLUTION) ×2 IMPLANT

## 2021-09-08 NOTE — Anesthesia Procedure Notes (Signed)
Procedure Name: LMA Insertion ?Date/Time: 09/08/2021 9:28 AM ?Performed by: Jenne Campus, CRNA ?Pre-anesthesia Checklist: Patient identified, Emergency Drugs available, Suction available and Patient being monitored ?Patient Re-evaluated:Patient Re-evaluated prior to induction ?Oxygen Delivery Method: Circle System Utilized ?Preoxygenation: Pre-oxygenation with 100% oxygen ?Induction Type: IV induction ?Ventilation: Mask ventilation without difficulty ?LMA: LMA inserted ?LMA Size: 4.0 ?Number of attempts: 1 ?Airway Equipment and Method: Bite block ?Placement Confirmation: positive ETCO2 ?Tube secured with: Tape ?Dental Injury: Teeth and Oropharynx as per pre-operative assessment  ? ? ? ? ?

## 2021-09-08 NOTE — Anesthesia Postprocedure Evaluation (Signed)
Anesthesia Post Note ? ?Patient: [name] ? ?Procedure(s) Performed: LEFT LOWER EXTREMITY IRRIGATION AND DEBRIDEMENT (Left) ?APPLICATION OF WOUND VAC WITH UNO BOOT (Left) ? ?  ? ?Patient location during evaluation: PACU ?Anesthesia Type: General ?Level of consciousness: sedated and patient cooperative ?Pain management: pain level controlled ?Vital Signs Assessment: post-procedure vital signs reviewed and stable ?Respiratory status: spontaneous breathing ?Cardiovascular status: stable ?Anesthetic complications: no ? ? ?No notable events documented. ? ?Last Vitals:  ?Vitals:  ? 09/08/21 1040 09/08/21 1055  ?BP: 111/72 (!) 109/58  ?Pulse: 69 62  ?Resp: 15 14  ?Temp:  36.7 ?C  ?SpO2: 95% 98%  ?  ?Last Pain:  ?Vitals:  ? 09/08/21 1040  ?TempSrc:   ?PainSc: 4   ? ? ?  ?  ?  ?  ?  ?  ? ?Nolon Nations ? ? ? ? ?

## 2021-09-08 NOTE — Op Note (Addendum)
? ? ?  Patient name: [name] MRN: [id number] DOB: [birth year] Sex: male ? ?09/08/2021 ?Pre-operative Diagnosis: left leg wound ?Post-operative diagnosis:  Same ?Surgeon:  Eda Paschal. Donzetta Matters, MD ?Assistant: Paulo Fruit, PA ?Procedure Performed: I&D of left leg wound and application of wound VAC to 6 x 2 x 1 cm wound and placement of Unna boot left lower extremity including skin and subcutaneous tissue ? ? ?Indications: 71 year old male with a history of left lower extremity bypass complicated by multiple wounds now with continued draining sinus of the left lateral leg.  He has healed most the wounds of the rest of the leg and foot but is now indicated for I&D placement of wound VAC of the left leg wound. ? ?An experienced assistant was necessary to help with opening of the wound as well as manipulation of the leg given the large size of the leg and to assist with placing wound VAC and Unna boot. ? ?Findings: There is a wound on the lateral leg that was opened to 6 total centimeters in length.  This included opening the skin and subcutaneous tissue and at completion there was adequate bleeding.  This did track up above the knee I did not open up there.  There is minimal drainage.  There was some heaped up granulation tissue that I removed with cautery.  A wound VAC was then fashioned and placed in Unna boot was placed up to the level of the wound VAC. ?  ?Procedure:  The patient was identified in the holding area and taken to the operating was placed supine operative table and LMA anesthesia was induced.  He was sterilely prepped draped in the left lower extremity, antibiotics were up-to-date and a timeout was called.  I began by opening the sinus tract cephalad and caudal to a total of 6 cm.  There was heaped up granulation tissue this was debrided using blunt DeBakey forceps and also Bovie cautery.  Hemostasis was obtained.  The wound was thoroughly irrigated.  A wound VAC was fashioned and placed there was some  difficulty getting to here to the skin so benzoin was used.  I then placed a Unna boot starting distally on the foot all the way up to the level of the wound VAC and this was wrapped with Coban and Ace wrap was placed around the wound VAC also wrapped with Coban.  He was then awakened from anesthesia having tolerated procedure without immediate complication.  All counts were correct at completion. ? ?EBL: 10 cc ? ? ? ? ?Jaylinn Hellenbrand C. Donzetta Matters, MD ?[facility] ?Office: [phone] ?Pager: [phone] ? ? ?

## 2021-09-08 NOTE — Anesthesia Preprocedure Evaluation (Addendum)
Anesthesia Evaluation  ?Patient identified by MRN, date of birth, ID band ?Patient awake ? ? ? ?Reviewed: ?Allergy & Precautions, NPO status , Patient's Chart, lab work & pertinent test results ? ?Airway ?Mallampati: III ? ?TM Distance: >3 FB ?Neck ROM: Full ? ? ? Dental ? ?(+) Dental Advisory Given, Partial Lower, Upper Dentures, Edentulous Upper, Missing ?  ?Pulmonary ?sleep apnea and Continuous Positive Airway Pressure Ventilation , former smoker,  ?  ?Pulmonary exam normal ?breath sounds clear to auscultation ? ? ? ? ? ? Cardiovascular ?hypertension, Pt. on medications ?+ Peripheral Vascular Disease  ? ?Rhythm:Regular Rate:Normal ? ? ?  ?Neuro/Psych ?PSYCHIATRIC DISORDERS Depression  Neuromuscular disease   ? GI/Hepatic ?negative GI ROS, Neg liver ROS,   ?Endo/Other  ?diabetes, Type 2, Insulin DependentObesity ? ? Renal/GU ?Renal InsufficiencyRenal disease  ? ?  ?Musculoskeletal ? ?(+) Arthritis , fluid collection left lower extremity  ? Abdominal ?  ?Peds ? Hematology ? ?(+) Blood dyscrasia (Plavix), anemia ,   ?Anesthesia Other Findings ? ? Reproductive/Obstetrics ? ?  ? ? ? ? ? ? ? ? ? ? ? ? ? ?  ?  ? ? ? ? ? ? ? ?Anesthesia Physical ? ?Anesthesia Plan ? ?ASA: 3 ? ?Anesthesia Plan: General  ? ?Post-op Pain Management: Tylenol PO (pre-op)  ? ?Induction: Intravenous ? ?PONV Risk Score and Plan: 2 and Dexamethasone, Ondansetron and Treatment may vary due to age or medical condition ? ?Airway Management Planned: LMA ? ?Additional Equipment:  ? ?Intra-op Plan:  ? ?Post-operative Plan: Extubation in OR ? ?Informed Consent: I have reviewed the patients History and Physical, chart, labs and discussed the procedure including the risks, benefits and alternatives for the proposed anesthesia with the patient or authorized representative who has indicated his/her understanding and acceptance.  ? ? ? ?Dental advisory given ? ?Plan Discussed with: CRNA ? ?Anesthesia Plan Comments: (Nuclear  stress test ?(05/13/2017) (Dr. Irven Shelling office):  ?1. The resting electrocardiogram demonstrated normal sinus rhythm, normal resting conduction and no resting arrhythmias. Stress EKG is non-diagnostic for ischemia as it a pharmacologic stress using Lexiscan. Stress symptoms included dyspnea, nausea, dizziness. ?2. Myocardial perfusion imaging is normal. Overall left ventricular systolic function was normal without regional wall motion abnormalities. The left ventricular ejection fraction was normal visually but mildly depressed at 45%. This is a low risk study. ? ?Echocardiogram ?(05/23/2017) (Dr. Irven Shelling office):  ?Left ventricle cavity is normal in size. Moderate concentric hypertrophy of the left ventricle. Normal global wall motion. Doppler evidence of grade I (impaired) diastolic dysfunction, normal LAP. Calculated EF 55%. ?No significant valvular abnormalities. ?No evidence of pulmonary hypertension.)  ? ? ? ? ? ? ?Anesthesia Quick Evaluation ? ?

## 2021-09-08 NOTE — Progress Notes (Signed)
Per Dr. Lissa [name], no need to treat CBG 61 at this time. Will check CBG again in PACU. No signs or symptoms of hypoglycemia noted per patient.  ?

## 2021-09-08 NOTE — Progress Notes (Signed)
Orthopedic Tech Progress Note ?Patient Details:  ?[name] ?1950-09-25 ?[id number] ?OR RN called requesting unna boot supplies ?Ortho Devices ?Type of Ortho Device: Unna boot ?Ortho Device/Splint Interventions: Ordered ?  ?  ? ?Shayonna Ocampo A Riki Gehring ?09/08/2021, 9:59 AM ? ?

## 2021-09-08 NOTE — Interval H&P Note (Signed)
History and Physical Interval Note: ? ?09/08/2021 ?7:27 AM ? ?[name]  has presented today for surgery, with the diagnosis of Open wound of left lower leg with complication.  The various methods of treatment have been discussed with the patient and family. After consideration of risks, benefits and other options for treatment, the patient has consented to  Procedure(s): ?LEFT LOWER EXTREMITY IRRIGATION AND DEBRIDEMENT (Left) ?APPLICATION OF WOUND VAC (Left) as a surgical intervention.  The patient's history has been reviewed, patient examined, no change in status, stable for surgery.  I have reviewed the patient's chart and labs.  Questions were answered to the patient's satisfaction.   ? ? ?[name] ? ? ?

## 2021-09-08 NOTE — Transfer of Care (Signed)
Immediate Anesthesia Transfer of Care Note ? ?Patient: [name] ? ?Procedure(s) Performed: LEFT LOWER EXTREMITY IRRIGATION AND DEBRIDEMENT (Left) ?APPLICATION OF WOUND VAC WITH UNO BOOT (Left) ? ?Patient Location: PACU ? ?Anesthesia Type:General ? ?Level of Consciousness: oriented, drowsy and patient cooperative ? ?Airway & Oxygen Therapy: Patient Spontanous Breathing and Patient connected to nasal cannula oxygen ? ?Post-op Assessment: Report given to RN and Post -op Vital signs reviewed and stable ? ?Post vital signs: Reviewed ? ?Last Vitals:  ?Vitals Value Taken Time  ?BP 119/70 09/08/21 1024  ?Temp    ?Pulse 73 09/08/21 1025  ?Resp 17 09/08/21 1025  ?SpO2 93 % 09/08/21 1025  ?Vitals shown include unvalidated device data. ? ?Last Pain:  ?Vitals:  ? 09/08/21 0717  ?TempSrc:   ?PainSc: 0-No pain  ?   ? ?  ? ?Complications: No notable events documented. ?

## 2021-09-09 ENCOUNTER — Encounter (HOSPITAL_COMMUNITY): Payer: Self-pay | Admitting: Vascular Surgery

## 2021-09-11 ENCOUNTER — Telehealth: Payer: Self-pay | Admitting: Nurse Practitioner

## 2021-09-11 NOTE — Telephone Encounter (Signed)
Pt left voicemail stating he is having phone trouble and would like to speak with the nurse to give information if you could call him back. ?

## 2021-09-11 NOTE — Telephone Encounter (Signed)
Patient states that he needs [place] to contact [facility] and tell them to send his supplies to him. They keep sending the request to his PCP ?

## 2021-09-11 NOTE — Telephone Encounter (Signed)
Called patient and left a detailed voice message to let him know that we need for him to call us back and be specific in what he gets from [facility] so we can send the correct supply order in for him. ?

## 2021-09-12 ENCOUNTER — Other Ambulatory Visit: Payer: Self-pay

## 2021-09-12 MED ORDER — DEXCOM G6 TRANSMITTER MISC
1 refills | Status: DC
Start: 2021-09-12 — End: 2021-09-13

## 2021-09-12 MED ORDER — DEXCOM G6 SENSOR MISC: 1 refills | Status: DC

## 2021-09-12 NOTE — Telephone Encounter (Signed)
Called patient and left a detailed voice message on his home and cell numbers for him to call back to give detailed information on the exact supplies he needs or he can send [place] a MyChart message also. ?

## 2021-09-12 NOTE — Telephone Encounter (Signed)
Patient called back and asked to have his Dexcom G6 sensors and transmitter sent to [place]. I have sent these in for the patient. ? ?Patient wants to start his Omnipod. Patient only has 30 days left of his Vgo 30. Can you start this for the patient? ?

## 2021-09-13 ENCOUNTER — Other Ambulatory Visit: Payer: Self-pay | Admitting: Nurse Practitioner

## 2021-09-13 MED ORDER — DEXCOM G6 SENSOR MISC: 1 refills | Status: DC

## 2021-09-13 MED ORDER — OMNIPOD DASH PODS (GEN 4) MISC: 11 refills | Status: DC

## 2021-09-13 MED ORDER — DEXCOM G6 TRANSMITTER MISC
1 refills | Status: DC
Start: 2021-09-13 — End: 2022-06-20

## 2021-09-13 MED ORDER — OMNIPOD DASH PDM (GEN 4) KIT
PACK | 0 refills | Status: DC
Start: 2021-09-13 — End: 2021-10-18

## 2021-09-13 NOTE — Telephone Encounter (Signed)
I would be happy to start this.  I sent his Omnipod kit to [facility] and his PODS to his local pharmacy on file.  Once he gets his kit, he will open and complete the registration which will jumpstart his training process

## 2021-09-13 NOTE — Telephone Encounter (Signed)
Patient called back and stated he canceled his order with [facility] and would like you to send it to [facility] ?

## 2021-09-13 NOTE — Telephone Encounter (Signed)
Done, sent it in

## 2021-09-13 NOTE — Telephone Encounter (Signed)
Spoke to the patient and [name], he is wanting the pods sent to [facility] and patient is aware the Kit went to [place] to help cover cost. Pt also ask for his dexcom supplies be sent to [facility] so he can handle everything in one place. Pt states he will call [facility] and cancel his orders with them. ?

## 2021-09-13 NOTE — Addendum Note (Signed)
Addended by: Brita Romp on: 09/13/2021 08:34 AM ? ? Modules accepted: Orders ? ?

## 2021-09-14 ENCOUNTER — Encounter (HOSPITAL_BASED_OUTPATIENT_CLINIC_OR_DEPARTMENT_OTHER): Payer: Medicare Other | Admitting: General Surgery

## 2021-09-14 ENCOUNTER — Other Ambulatory Visit: Payer: Self-pay

## 2021-09-14 DIAGNOSIS — E11621 Type 2 diabetes mellitus with foot ulcer: Secondary | ICD-10-CM | POA: Diagnosis not present

## 2021-09-14 NOTE — Progress Notes (Signed)
ALEISTER, LADY ([id number]) ?Visit Report for 09/14/2021 ?Chief Complaint Document Details ?Patient Name: Date of Service: ?[place], [name] 09/14/2021 10:00 A M ?Medical Record Number: [id number] ?Patient Account Number: [id number] ?Date of Birth/Sex: Treating RN: ?[birth year] (71 y.o. [name] ?Primary Care Provider: [name] Other Clinician: ?Referring Provider: ?Treating Provider/Extender: Fredirick Maudlin ?[name] ?Weeks in Treatment: 22 ?Information Obtained from: Patient ?Chief Complaint ?Left leg and foot ulcers ?04/12/2021; patient is here for wounds on his left lower leg and left plantar foot over the first metatarsal head ?Electronic Signature(s) ?Signed: 09/14/2021 11:15:47 AM By: Fredirick Maudlin MD FACS ?Entered By: Fredirick Maudlin on 09/14/2021 11:15:47 ?-------------------------------------------------------------------------------- ?Debridement Details ?Patient Name: Date of Service: ?[place], [name] 09/14/2021 10:00 A M ?Medical Record Number: [id number] ?Patient Account Number: [id number] ?Date of Birth/Sex: Treating RN: ?1951-06-15 (71 y.o. [name] ?Primary Care Provider: [name] Other Clinician: ?Referring Provider: ?Treating Provider/Extender: Fredirick Maudlin ?[name] ?Weeks in Treatment: 22 ?Debridement Performed for Assessment: Wound #22 Left,Lateral Lower Leg ?Performed By: Physician Fredirick Maudlin, MD ?Debridement Type: Debridement ?Level of Consciousness (Pre-procedure): Awake and Alert ?Pre-procedure Verification/Time Out Yes - 10:55 ?Taken: ?Start Time: 10:55 ?T Area Debrided (L x W): ?otal 7 (cm) x 2 (cm) = 14 (cm?) ?Tissue and other material debrided: Non-Viable, [place], Biofilm, [place] ?Level: Non-Viable Tissue ?Debridement Description: Selective/Open Wound ?Instrument: Curette ?Bleeding: Minimum ?Hemostasis Achieved: Pressure ?End Time: 10:57 ?Procedural Pain: 0 ?Post Procedural Pain: 0 ?Response to Treatment: Procedure was tolerated well ?Level of  Consciousness (Post- Awake and Alert ?procedure): ?Post Debridement Measurements of Total Wound ?Length: (cm) 7 ?Width: (cm) 2.1 ?Depth: (cm) 0.9 ?Volume: (cm?) 10.391 ?Character of Wound/Ulcer Post Debridement: Improved ?Post Procedure Diagnosis ?Same as Pre-procedure ?Electronic Signature(s) ?Signed: 09/14/2021 12:32:07 PM By: Fredirick Maudlin MD FACS ?Signed: 09/14/2021 5:45:07 PM By: Levan Hurst RN, BSN ?Entered By: Levan Hurst on 09/14/2021 10:57:56 ?-------------------------------------------------------------------------------- ?Debridement Details ?Patient Name: ?Date of Service: ?[place], [name] 09/14/2021 10:00 A M ?Medical Record Number: [id number] ?Patient Account Number: [id number] ?Date of Birth/Sex: ?Treating RN: ?06-Mar-1951 (71 y.o. [name] ?Primary Care Provider: [name] ?Other Clinician: ?Referring Provider: ?Treating Provider/Extender: Fredirick Maudlin ?[name] ?Weeks in Treatment: 22 ?Debridement Performed for Assessment: Wound #18 Left,Plantar Metatarsal head first ?Performed By: Physician Fredirick Maudlin, MD ?Debridement Type: Debridement ?Severity of Tissue Pre Debridement: Fat layer exposed ?Level of Consciousness (Pre-procedure): Awake and Alert ?Pre-procedure Verification/Time Out Yes - 10:55 ?Taken: ?Start Time: 10:57 ?T Area Debrided (L x W): ?otal 1 (cm) x 1.3 (cm) = 1.3 (cm?) ?Tissue and other material debrided: Non-Viable, [place]us ?Level: Non-Viable Tissue ?Debridement Description: Selective/Open Wound ?Instrument: Curette ?Bleeding: Minimum ?Hemostasis Achieved: Pressure ?End Time: 10:59 ?Procedural Pain: 0 ?Post Procedural Pain: 0 ?Response to Treatment: Procedure was tolerated well ?Level of Consciousness (Post- Awake and Alert ?procedure): ?Post Debridement Measurements of Total Wound ?Length: (cm) 1 ?Width: (cm) 1.3 ?Depth: (cm) 0.1 ?Volume: (cm?) 0.102 ?Character of Wound/Ulcer Post Debridement: Improved ?Severity of Tissue Post Debridement: Fat layer  exposed ?Post Procedure Diagnosis ?Same as Pre-procedure ?Electronic Signature(s) ?Signed: 09/14/2021 12:32:07 PM By: Fredirick Maudlin MD FACS ?Signed: 09/14/2021 5:45:07 PM By: Levan Hurst RN, BSN ?Entered By: Levan Hurst on 09/14/2021 10:58:52 ?-------------------------------------------------------------------------------- ?HPI Details ?Patient Name: ?Date of Service: ?[place], [name] 09/14/2021 10:00 A M ?Medical Record Number: [id number] ?Patient Account Number: [id number] ?Date of Birth/Sex: Treating RN: ?October 08, 1950 (71 y.o. [name] ?Primary Care Provider: Other Clinician: ?[name] ?Referring Provider: ?Treating Provider/Extender: Fredirick Maudlin ?[name] ?Weeks in Treatment:  22 ?History of Present Illness ?HPI Description: 10/11/17; Mr. Chui is a 71 year old man who tells me that in 2015 he slipped down the latter traumatizing his left leg. He developed a wound ?in the same spot the area that we are currently looking at. He states this closed over for the most part although he always felt it was somewhat unstable. In ?2016 he hit the same area with the door of his car had this reopened. He tells me that this is never really closed although sometimes an inflow it remains open ?on a constant basis. He has not been using any specific dressing to this except for topical antibiotics the nature of which were not really sure. ?His primary doctor did send him to see Dr. [name] of interventional cardiology. He underwent an angiogram on 08/06/17 and he underwent a PTA and directional ?atherectomy of the lesser distal SFA and popliteal arteries which resulted in brisk improvement in blood flow. It was noted that he had 2 vessel runoff through ?the anterior tibial and peroneal. He is also been to see vascular and interventional radiologist. He was not felt to have any significant superficial venous ?insufficiency. Presumably is not a candidate for any ablation. It was suggested he come here for wound  care. ?The patient is a type II diabetic on insulin. He also has a history of venous insufficiency. ?ABIs on the left were noncompressible in our clinic ?10/21/17; patient we admitted to the clinic last week. 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 We ?put Iodosorb on him after an aggressive debridement and 3 layer compression. He complained of pain in his ankle and itching with is skin in fact he scratched ?the area on the medial calf superiorly at the rim of our wraps and he has 2 small open areas in that location today which are new. I changed his primary ?dressing today to silver collagen. As noted he is already had revascularization and does not have any significant superficial venous insufficiency that would ?be amenable to ablation ?10/28/17; patient admitted to the clinic 2 weeks ago. He has a smaller Wound. Scratch injury from last week revealed. There is large wound over the tibial area. ?This is smaller. Granulation looks healthy. No need for debridement. ?11/04/17; the wound on the left lateral calf looks better. Improved dimensions. Surface of this looks better. We've been maintaining him and Kerlix Coban ?wraps. He finds this much more comfortable. Silver collagen dressing ?11/11/17; left lateral Wound continues to look healthy be making progress. Using a #5 curet I removed removed nonviable skin from the surface of the wound ?and then necrotic debris from the wound surface. Surface of the wound continues to look healthy. ?He also has an open area on the left great toenail bed. We've been using topical antibiotics. ?11/19/17; left anterior lateral wound continues to look healthy but it's not closed. ?He also had a small wound above this on the left leg ?Initially traumatic wounds in the setting of significant chronic venous insufficiency and stasis dermatitis ?11/25/17; left anterior wounds superiorly is closed still a small wound  inferiorly. ?12/02/17; left anterior tibial area. Arrives today with adherent callus. Post debridement clearly not completely closed. Hydrofera Blue under 3 layer compression. ?12/09/17; left anterior tibia. Circumferential

## 2021-09-14 NOTE — Progress Notes (Signed)
MARKEES, CARNS ([id number]) ?Visit Report for 09/14/2021 ?Arrival Information Details ?Patient Name: Date of Service: ?[place], [name] 09/14/2021 10:00 A M ?Medical Record Number: [id number] ?Patient Account Number: [id number] ?Date of Birth/Sex: Treating RN: ?03-21-1951 (71 y.o. [name] ?Primary Care Azeez Dunker: [name] Other Clinician: ?Referring Savera Donson: ?Treating Dorita Rowlands/Extender: Fredirick Maudlin ?[name] ?Weeks in Treatment: 22 ?Visit Information History Since Last Visit ?Added or deleted any medications: No ?Patient Arrived: Ambulatory ?Any new allergies or adverse reactions: No ?Arrival Time: 10:20 ?Had a fall or experienced change in No ?Accompanied By: self ?activities of daily living that may affect ?Transfer Assistance: None ?risk of falls: ?Patient Identification Verified: Yes ?Signs or symptoms of abuse/neglect since last visito No ?Secondary Verification Process Completed: Yes ?Hospitalized since last visit: Yes ?Patient Requires Transmission-Based Precautions: No ?Implantable device outside of the clinic excluding No ?Patient Has Alerts: Yes ?cellular tissue based products placed in the center ?Patient Alerts: ?ABI's: 09/22 L:1.09 R: 1. since last visit: ?TBI's: R:0.72 L:0.99 ?Has Dressing in Place as Prescribed: Yes ?Has Compression in Place as Prescribed: Yes ?Pain Present Now: Yes ?Electronic Signature(s) ?Signed: 09/14/2021 5:41:11 PM By: Baruch Gouty RN, BSN ?Entered By: Baruch Gouty on 09/14/2021 10:22:01 ?-------------------------------------------------------------------------------- ?Compression Therapy Details ?Patient Name: Date of Service: ?[place], [name] 09/14/2021 10:00 A M ?Medical Record Number: [id number] ?Patient Account Number: [id number] ?Date of Birth/Sex: Treating RN: ?[birth year] (71 y.o. [name] ?Primary Care Ressie Slevin: [name] Other Clinician: ?Referring Jasmynn Pfalzgraf: ?Treating Eyal Greenhaw/Extender: Fredirick Maudlin ?[name] ?Weeks in Treatment:  22 ?Compression Therapy Performed for Wound Assessment: Wound #22 Left,Lateral Lower Leg ?Performed By: Clinician Levan Hurst, RN ?Compression Type: Double Layer ?Post Procedure Diagnosis ?Same as Pre-procedure ?Electronic Signature(s) ?Signed: 09/14/2021 5:45:07 PM By: Levan Hurst RN, BSN ?Entered By: Levan Hurst on 09/14/2021 10:59:47 ?-------------------------------------------------------------------------------- ?Compression Therapy Details ?Patient Name: ?Date of Service: ?[place], [name] 09/14/2021 10:00 A M ?Medical Record Number: [id number] ?Patient Account Number: [id number] ?Date of Birth/Sex: ?Treating RN: ?07-14-1950 (71 y.o. [name] ?Primary Care Reilley Valentine: [name] ?Other Clinician: ?Referring Chevis Weisensel: ?Treating Desirey Keahey/Extender: Fredirick Maudlin ?[name] ?Weeks in Treatment: 22 ?Compression Therapy Performed for Wound Assessment: Wound #18 Left,Plantar Metatarsal head first ?Performed By: Clinician Levan Hurst, RN ?Compression Type: Double Layer ?Post Procedure Diagnosis ?Same as Pre-procedure ?Electronic Signature(s) ?Signed: 09/14/2021 5:45:07 PM By: Levan Hurst RN, BSN ?Entered By: Levan Hurst on 09/14/2021 10:59:47 ?-------------------------------------------------------------------------------- ?Lower Extremity Assessment Details ?Patient Name: ?Date of Service: ?[place], [name] 09/14/2021 10:00 A M ?Medical Record Number: [id number] ?Patient Account Number: [id number] ?Date of Birth/Sex: ?Treating RN: ?06-19-1951 (71 y.o. [name] ?Primary Care Paiten Boies: [name] ?Other Clinician: ?Referring Keymarion Bearman: ?Treating Geralene Afshar/Extender: Fredirick Maudlin ?[name] ?Weeks in Treatment: 22 ?Edema Assessment ?Assessed: [Left: No] [Right: No] ?Edema: [Left: Ye] [Right: s] ?Calf ?Left: Right: ?Point of Measurement: 41 cm From Medial Instep 37.4 cm ?Ankle ?Left: Right: ?Point of Measurement: 10 cm From Medial Instep 27.2 cm ?Vascular  Assessment ?Pulses: ?Dorsalis Pedis ?Palpable: [Left:No] ?Electronic Signature(s) ?Signed: 09/14/2021 5:41:11 PM By: Baruch Gouty RN, BSN ?Entered By: Baruch Gouty on 09/14/2021 10:34:22 ?-------------------------------------------------------------------------------- ?Multi Wound Chart Details ?Patient Name: ?Date of Service: ?[place], [name] 09/14/2021 10:00 A M ?Medical Record Number: [id number] ?Patient Account Number: [id number] ?Date of Birth/Sex: ?Treating RN: ?May 11, 1951 (71 y.o. [name] ?Primary Care Ger Nicks: [name] ?Other Clinician: ?Referring Yeshua Stryker: ?Treating Jonna Dittrich/Extender: Fredirick Maudlin ?[name] ?Weeks in Treatment: 22 ?Vital Signs ?Height(in): 74 ?Capillary Blood Glucose(mg/dl): 166 ?Weight(lbs): 238 ?Pulse(bpm): 77 ?Body Mass  Index(BMI): 30.6 ?Blood Pressure(mmHg): 145/89 ?Temperature(??F): 98.4 ?Respiratory Rate(breaths/min): 18 ?Photos: [N/A:N/A] ?Left, Plantar Metatarsal head first Left, Lateral Lower Leg N/A ?Wound Location: ?Gradually Appeared Bump N/A ?Wounding Event: ?Diabetic Wound/Ulcer of the Lower Cyst N/A ?Primary Etiology: ?Extremity ?Glaucoma, Sleep Apnea, Glaucoma, Sleep Apnea, N/A ?Comorbid History: ?Hypertension, Peripheral Arterial Hypertension, Peripheral Arterial ?Disease, Peripheral Venous Disease,Disease, Peripheral Venous Disease, ?Type II Diabetes, Gout, Osteoarthritis, ?Type II Diabetes, Gout, Osteoarthritis, ?Neuropathy Neuropathy ?08/23/2020 06/03/2021 N/A ?Date Acquired: ?68 14 N/A ?Weeks of Treatment: ?Open Open N/A ?Wound Status: ?No No N/A ?Wound Recurrence: ?1x1.3x0.1 7x2.1x0.9 N/A ?Measurements L x W x D (cm) ?1.021 11.545 N/A ?A (cm?) : ?rea ?0.102 10.391 N/A ?Volume (cm?) : ?92.80% -600.10% N/A ?% Reduction in A rea: ?96.40% -687.80% N/A ?% Reduction in Volume: ?12 ?Position 1 (o'clock): ?14 ?Maximum Distance 1 (cm): ?No Yes N/A ?Tunneling: ?Grade 2 Full Thickness With Exposed Support N/A ?Classification: ?Structures ?Medium Large  N/A ?Exudate A mount: ?Serosanguineous Serosanguineous N/A ?Exudate Type: ?red, brown red, brown N/A ?Exudate Color: ?Thickened Well defined, not attached N/A ?Wound Margin: ?Large (67-100%) Large (67-100%) N/A ?Granulation A mount: ?Red Red N/A ?Granulation Quality: ?None Present (0%) None Present (0%) N/A ?Necrotic A mount: ?Fat Layer (Subcutaneous Tissue): Yes Fat Layer (Subcutaneous Tissue): Yes N/A ?Exposed Structures: ?Fascia: No ?Fascia: No ?Tendon: No ?Tendon: No ?Muscle: No ?Muscle: No ?Joint: No ?Joint: No ?Bone: No ?Bone: No ?Small (1-33%) None N/A ?Epithelialization: ?Debridement - Selective/Open Wound Debridement - Selective/Open Wound N/A ?Debridement: ?Pre-procedure Verification/Time Out 10:55 10:55 N/A ?Taken: ?[employer] N/A ?Tissue Debrided: ?Non-Viable Tissue Non-Viable Tissue N/A ?Level: ?1.3 14 N/A ?Debridement A (sq cm): ?rea ?Curette Curette N/A ?Instrument: ?Minimum Minimum N/A ?Bleeding: ?Pressure Pressure N/A ?Hemostasis A chieved: ?0 0 N/A ?Procedural Pain: ?0 0 N/A ?Post Procedural Pain: ?Procedure was tolerated well Procedure was tolerated well N/A ?Debridement Treatment Response: ?1x1.3x0.1 7x2.1x0.9 N/A ?Post Debridement Measurements L x ?W x D (cm) ?0.102 10.391 N/A ?Post Debridement Volume: (cm?) ?Compression Therapy Compression Therapy N/A ?Procedures Performed: ?Debridement Debridement ?Treatment Notes ?Electronic Signature(s) ?Signed: 09/14/2021 11:15:34 AM By: Fredirick Maudlin MD FACS ?Signed: 09/14/2021 5:45:07 PM By: Levan Hurst RN, BSN ?Entered By: Fredirick Maudlin on 09/14/2021 11:15:34 ?-------------------------------------------------------------------------------- ?Multi-Disciplinary Care Plan Details ?Patient Name: ?Date of Service: ?[place], [name] 09/14/2021 10:00 A M ?Medical Record Number: [id number] ?Patient Account Number: [id number] ?Date of Birth/Sex: ?Treating RN: ?12-13-1950 (71 y.o. [name] ?Primary Care Jackqueline Aquilar: [name] ?Other  Clinician: ?Referring Lamon Rotundo: ?Treating Yoselyn Mcglade/Extender: Fredirick Maudlin ?[name] ?Weeks in Treatment: 22 ?Multidisciplinary Care Plan reviewed with physician ?Active Inactive ?Venous Leg Ulcer ?Nursing Diagnoses: ?Knowledge deficit rela

## 2021-09-15 ENCOUNTER — Telehealth: Payer: Self-pay | Admitting: Nurse Practitioner

## 2021-09-15 NOTE — Telephone Encounter (Signed)
Patient left a VM stating that [facility] is waiting on permission to ship out his supplies ?

## 2021-09-18 ENCOUNTER — Encounter (HOSPITAL_BASED_OUTPATIENT_CLINIC_OR_DEPARTMENT_OTHER): Payer: Medicare Other | Admitting: General Surgery

## 2021-09-18 ENCOUNTER — Other Ambulatory Visit: Payer: Self-pay

## 2021-09-18 DIAGNOSIS — E11621 Type 2 diabetes mellitus with foot ulcer: Secondary | ICD-10-CM | POA: Diagnosis not present

## 2021-09-18 NOTE — Progress Notes (Signed)
MARITZA, GOLDSBOROUGH ([id number]) ?Visit Report for 09/18/2021 ?SuperBill Details ?Patient Name: Date of Service: ?[place], [name] 09/18/2021 ?Medical Record Number: [id number] ?Patient Account Number: [id number] ?Date of Birth/Sex: Treating RN: ?1950/09/09 (71 y.o. [name] ?Primary Care Provider: [name] Other Clinician: ?Referring Provider: ?Treating Provider/Extender: Fredirick Maudlin ?[name] ?Weeks in Treatment: 22 ?Diagnosis Coding ?ICD-10 Codes ?Code Description ?E11.621 Type 2 diabetes mellitus with foot ulcer ?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 ?I89.0 Lymphedema, not elsewhere classified ?K59.935 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 ?T01.779 Non-pressure chronic ulcer of other part of left lower leg with other specified severity ?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 of other part of left foot with other specified severity ?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 ?Facility Procedures ?CPT4 Code Description Modifier Quantity ?39030092 33007 - WOUND VAC-50 SQ CM OR LESS 1 ?[id number] (Facility Use Only) 29581LT - APPLY MULTLAY COMPRS LWR LT LEG 1 ?Electronic Signature(s) ?Signed: 09/18/2021 2:36:24 PM By: Fredirick Maudlin MD FACS ?Signed: 09/18/2021 5:11:06 PM By: Levan Hurst RN, BSN ?Entered By: Levan Hurst on 09/18/2021 11:59:48 ?

## 2021-09-18 NOTE — Progress Notes (Signed)
[name], [name] ([id number]) ?Visit Report for 09/18/2021 ?Arrival Information Details ?Patient Name: Date of Service: ?[name], [name] 09/18/2021 11:15 A M ?Medical Record Number: [id number] ?Patient Account Number: [id number] ?Date of Birth/Sex: Treating RN: ?27-Nov-1950 (71 y.o. [name] ?Primary Care [name]: [name] Other Clinician: ?Referring [name]: ?Treating [name]/Extender: [name] ?[name] ?Weeks in Treatment: 22 ?Visit Information History Since Last Visit ?Added or deleted any medications: No ?Patient Arrived: Ambulatory ?Any new allergies or adverse reactions: No ?Arrival Time: 11:18 ?Had a fall or experienced change in No ?Accompanied By: alone ?activities of daily living that may affect ?Transfer Assistance: None ?risk of falls: ?Patient Identification Verified: Yes ?Signs or symptoms of abuse/neglect since last visito No ?Secondary Verification Process Completed: Yes ?Hospitalized since last visit: No ?Patient Requires Transmission-Based Precautions: No ?Implantable device outside of the clinic excluding No ?Patient Has Alerts: Yes ?cellular tissue based products placed in the center ?Patient Alerts: ?ABI's: 09/22 L:1.09 R: 1. since last visit: ?TBI's: R:0.72 L:0.99 ?Has Dressing in Place as Prescribed: Yes ?Has Compression in Place as Prescribed: Yes ?Pain Present Now: No ?Electronic Signature(s) ?Signed: 09/18/2021 5:11:06 PM By: Levan Hurst RN, BSN ?Entered By: Levan Hurst on 09/18/2021 11:51:48 ?-------------------------------------------------------------------------------- ?Compression Therapy Details ?Patient Name: Date of Service: ?[name], [name] 09/18/2021 11:15 A M ?Medical Record Number: [id number] ?Patient Account Number: [id number] ?Date of Birth/Sex: Treating RN: ?May 06, 1951 (71 y.o. [name] ?Primary Care [name]: [name] Other Clinician: ?Referring Alani Sabbagh: ?Treating [name]/Extender: [name] ?[name] ?Weeks in Treatment:  22 ?Compression Therapy Performed for Wound Assessment: Wound #22 Left,Lateral Lower Leg ?Performed By: Clinician Levan Hurst, RN ?Compression Type: Double Layer ?Electronic Signature(s) ?Signed: 09/18/2021 5:11:06 PM By: Levan Hurst RN, BSN ?Entered By: Levan Hurst on 09/18/2021 11:58:40 ?-------------------------------------------------------------------------------- ?Compression Therapy Details ?Patient Name: Date of Service: ?[name], [name] 09/18/2021 11:15 A M ?Medical Record Number: [id number] ?Patient Account Number: [id number] ?Date of Birth/Sex: ?Treating RN: ?Feb 27, 1951 (71 y.o. [name] ?Primary Care Babbette Dalesandro: [name] ?Other Clinician: ?Referring Elyana Grabski: ?Treating [name]/Extender: [name] ?[name] ?Weeks in Treatment: 22 ?Compression Therapy Performed for Wound Assessment: Wound #18 Left,Plantar Metatarsal head first ?Performed By: Clinician Levan Hurst, RN ?Compression Type: Double Layer ?Electronic Signature(s) ?Signed: 09/18/2021 5:11:06 PM By: Levan Hurst RN, BSN ?Entered By: Levan Hurst on 09/18/2021 11:58:40 ?-------------------------------------------------------------------------------- ?Encounter Discharge Information Details ?Patient Name: ?Date of Service: ?[name], [name] 09/18/2021 11:15 A M ?Medical Record Number: [id number] ?Patient Account Number: [id number] ?Date of Birth/Sex: ?Treating RN: ?1950/07/30 (71 y.o. [name] ?Primary Care Huyen Perazzo: [name] ?Other Clinician: ?Referring [name]: ?Treating [name]/Extender: [name] ?[name] ?Weeks in Treatment: 22 ?Encounter Discharge Information Items ?Discharge Condition: Stable ?Ambulatory Status: Ambulatory ?Discharge Destination: Home ?Transportation: Private Auto ?Accompanied By: alone ?Schedule Follow-up Appointment: Yes ?Clinical Summary of Care: Patient Declined ?Electronic Signature(s) ?Signed: 09/18/2021 5:11:06 PM By: Levan Hurst RN, BSN ?Entered By: Levan Hurst on 09/18/2021 11:59:19 ?-------------------------------------------------------------------------------- ?Negative Pressure Wound Therapy Maintenance (NPWT) Details ?Patient Name: ?Date of Service: ?[name], [name] 09/18/2021 11:15 A M ?Medical Record Number: [id number] ?Patient Account Number: [id number] ?Date of Birth/Sex: ?Treating RN: ?04-17-1951 (71 y.o. [name] ?Primary Care [name]: [name] ?Other Clinician: ?Referring [name]: ?Treating Kamyrah Feeser/Extender: [name] ?[name] ?Weeks in Treatment: 22 ?NPWT Maintenance Performed for: Wound #22 Left, Lateral Lower Leg ?Additional Injuries Covered: No ?Performed By: Levan Hurst, RN ?Type: VAC System ?Coverage Size (sq cm): 14.7 ?Pressure Type: Constant ?Pressure Setting: 125 mmHG ?Drain Type: None ?Sponge/Dressing Type: ?  Combination : white and black foam ?Date Initiated: 07/27/2021 ?Dressing Removed: Yes ?Quantity of Sponges/Gauze Removed: 1 piece white foam, 1 piece black foam ?Canister Changed: No ?Dressing Reapplied: Yes ?Quantity of Sponges/Gauze Inserted: 1 piece white foam, 1 piece black foam ?Respones T Treatment: ?o pt tolerated well ?Days On NPWT : 54 ?Electronic Signature(s) ?Signed: 09/18/2021 5:11:06 PM By: Levan Hurst RN, BSN ?Entered By: Levan Hurst on 09/18/2021 11:58:11 ?-------------------------------------------------------------------------------- ?Wound Assessment Details ?Patient Name: ?Date of Service: ?[name], [name] 09/18/2021 11:15 A M ?Medical Record Number: [id number] ?Patient Account Number: [id number] ?Date of Birth/Sex: ?Treating RN: ?January 29, 1951 (71 y.o. [name] ?Primary Care [name]: [name] ?Other Clinician: ?Referring [name]: ?Treating [name]/Extender: [name] ?[name] ?Weeks in Treatment: 22 ?Wound Status ?Wound Number: 18 Primary Diabetic Wound/Ulcer of the Lower Extremity ?Etiology: ?Wound Location: Left, Plantar Metatarsal head first ?Wound Open ?Wounding  Event: Gradually Appeared ?Status: ?Date Acquired: 08/23/2020 ?Comorbid Glaucoma, Sleep Apnea, Hypertension, Peripheral Arterial Disease, ?Weeks Of Treatment: 22 ?History: Peripheral Venous Disease, Type II Diabetes, Gout, Osteoarthritis, ?Clustered Wound: No Neuropathy ?Wound Measurements ?Length: (cm) 1 ?Width: (cm) 1.3 ?Depth: (cm) 0.1 ?Area: (cm?) 1.021 ?Volume: (cm?) 0.102 ?% Reduction in Area: 92.8% ?% Reduction in Volume: 96.4% ?Epithelialization: Small (1-33%) ?Tunneling: No ?Undermining: No ?Wound Description ?Classification: Grade 2 ?Wound Margin: Thickened ?Exudate Amount: Medium ?Exudate Type: Serosanguineous ?Exudate Color: red, brown ?Foul Odor After Cleansing: No ?Slough/Fibrino No ?Wound Bed ?Granulation Amount: Large (67-100%) Exposed Structure ?Granulation Quality: Red ?Fascia Exposed: No ?Necrotic Amount: None Present (0%) ?Fat Layer (Subcutaneous Tissue) Exposed: Yes ?Tendon Exposed: No ?Muscle Exposed: No ?Joint Exposed: No ?Bone Exposed: No ?Treatment Notes ?Wound #18 (Metatarsal head first) Wound Laterality: Plantar, Left ?Cleanser ?Soap and Water ?Discharge Instruction: May shower and wash wound with dial antibacterial soap and water prior to dressing change. ?Wound Cleanser ?Discharge Instruction: Cleanse the wound with wound cleanser prior to applying a clean dressing using gauze sponges, not tissue or cotton ?balls. ?Peri-Wound Care ?Triamcinolone 15 (g) ?Discharge Instruction: Use triamcinolone 15 (g) as directed ?Sween Lotion (Moisturizing lotion) ?Discharge Instruction: Apply moisturizing lotion as directed ?Topical ?Primary Dressing ?Hydrofera Blue Ready Foam, 2.5 x2.5 in ?Discharge Instruction: Apply to wound bed as instructed ?Secondary Dressing ?ABD Pad, 5x9 ?Discharge Instruction: Apply over primary dressing as directed. ?Secured With ?Compression Wrap ?CoFlex TLC XL 2-layer Compression System 4x7 (in/yd) ?Discharge Instruction: Apply CoFlex 2-layer compression as directed. (alt for  4 layer) ?Compression Stockings ?Add-Ons ?Electronic Signature(s) ?Signed: 09/18/2021 5:11:06 PM By: Levan Hurst RN, BSN ?Entered By: Levan Hurst on 09/18/2021 11:52:28 ?-------------------------------

## 2021-09-18 NOTE — Telephone Encounter (Signed)
Called [facility] and spoke to [place], she gave me the prior authorization line of [id number], I was able to get the [place] [employer] approved for the patient. ?PA # [id number] ? ?I called the patient and left a detailed voice message for him to call [facility] to complete his order to be sent. ?

## 2021-09-21 ENCOUNTER — Telehealth: Payer: Self-pay | Admitting: Nurse Practitioner

## 2021-09-21 ENCOUNTER — Encounter (HOSPITAL_BASED_OUTPATIENT_CLINIC_OR_DEPARTMENT_OTHER): Payer: Medicare Other | Admitting: General Surgery

## 2021-09-21 ENCOUNTER — Other Ambulatory Visit: Payer: Self-pay

## 2021-09-21 DIAGNOSIS — E11621 Type 2 diabetes mellitus with foot ulcer: Secondary | ICD-10-CM | POA: Diagnosis not present

## 2021-09-21 LAB — HM DIABETES EYE EXAM

## 2021-09-21 MED ORDER — ONETOUCH ULTRA VI STRP: ORAL_STRIP | 1 refills | Status: AC

## 2021-09-21 NOTE — Telephone Encounter (Signed)
Prescription has been sent to the requested pharmacy. ?

## 2021-09-21 NOTE — Progress Notes (Signed)
[name], [name] ([id number]) ?Visit Report for 09/21/2021 ?Arrival Information Details ?Patient Name: Date of Service: ?[name], [name]. 09/21/2021 12:45 PM ?Medical Record Number: [id number] ?Patient Account Number: [id number] ?Date of Birth/Sex: Treating RN: ?May 23, 1951 (71 y.o. [name] ?Primary Care [place] Sigmon: [name] Other Clinician: ?Referring Taylan Mayhan: ?Treating Nyella Eckels/Extender: Fredirick Maudlin ?[name] ?Weeks in Treatment: 23 ?Visit Information History Since Last Visit ?Added or deleted any medications: No ?Patient Arrived: Ambulatory ?Any new allergies or adverse reactions: No ?Arrival Time: 12:48 ?Had a fall or experienced change in No ?Transfer Assistance: None ?activities of daily living that may affect ?Patient Identification Verified: Yes ?risk of falls: ?Secondary Verification Process Completed: Yes ?Signs or symptoms of abuse/neglect since last visito No ?Patient Requires Transmission-Based Precautions: No ?Hospitalized since last visit: No ?Patient Has Alerts: Yes ?Implantable device outside of the clinic excluding No ?Patient Alerts: ?ABI's: 09/22 L:1.09 R: 1. ?cellular tissue based products placed in the center ?TBI's: R:0.72 L:0.99 ?since last visit: ?Has Dressing in Place as Prescribed: Yes ?Pain Present Now: Yes ?Electronic Signature(s) ?Signed: 09/21/2021 5:16:22 PM By: Sharyn Creamer RN, BSN ?Entered By: Sharyn Creamer on 09/21/2021 12:50:14 ?-------------------------------------------------------------------------------- ?Compression Therapy Details ?Patient Name: Date of Service: ?[name], [name]. 09/21/2021 12:45 PM ?Medical Record Number: [id number] ?Patient Account Number: [id number] ?Date of Birth/Sex: Treating RN: ?[birth year] (71 y.o. [name] ?Primary Care Aitanna Haubner: [name] Other Clinician: ?Referring Zaylei Mullane: ?Treating Wretha Laris/Extender: Fredirick Maudlin ?[name] ?Weeks in Treatment: 23 ?Compression Therapy Performed for Wound Assessment: Wound #22  Left,Lateral Lower Leg ?Performed By: Clinician Levan Hurst, RN ?Compression Type: Double Layer ?Post Procedure Diagnosis ?Same as Pre-procedure ?Electronic Signature(s) ?Signed: 09/21/2021 5:30:36 PM By: Levan Hurst RN, BSN ?Entered By: Levan Hurst on 09/21/2021 13:29:12 ?-------------------------------------------------------------------------------- ?Encounter Discharge Information Details ?Patient Name: ?Date of Service: ?[name], [name]. 09/21/2021 12:45 PM ?Medical Record Number: [id number] ?Patient Account Number: [id number] ?Date of Birth/Sex: ?Treating RN: ?1950/07/25 (71 y.o. [name] ?Primary Care Jossette Zirbel: [name] ?Other Clinician: ?Referring Banner Huckaba: ?Treating Kendyl Bissonnette/Extender: Fredirick Maudlin ?[name] ?Weeks in Treatment: 23 ?Encounter Discharge Information Items Post Procedure Vitals ?Discharge Condition: Stable ?Temperature (F): 98.2 ?Ambulatory Status: Ambulatory ?Pulse (bpm): 59 ?Discharge Destination: Home ?Respiratory Rate (breaths/min): 18 ?Transportation: Private Auto ?Blood Pressure (mmHg): 134/83 ?Accompanied By: alone ?Schedule Follow-up Appointment: Yes ?Clinical Summary of Care: Patient Declined ?Electronic Signature(s) ?Signed: 09/21/2021 5:30:36 PM By: Levan Hurst RN, BSN ?Entered By: Levan Hurst on 09/21/2021 16:11:59 ?-------------------------------------------------------------------------------- ?Lower Extremity Assessment Details ?Patient Name: ?Date of Service: ?[name], [name]. 09/21/2021 12:45 PM ?Medical Record Number: [id number] ?Patient Account Number: [id number] ?Date of Birth/Sex: ?Treating RN: ?[birth year] (71 y.o. [name] ?Primary Care Judeth Gilles: [name] ?Other Clinician: ?Referring Masson Nalepa: ?Treating Zanyah Lentsch/Extender: Fredirick Maudlin ?[name] ?Weeks in Treatment: 23 ?Edema Assessment ?Assessed: [Left: No] [Right: No] ?Edema: [Left: Ye] [Right: s] ?Calf ?Left: Right: ?Point of Measurement: 41 cm From Medial Instep 41.5  cm ?Ankle ?Left: Right: ?Point of Measurement: 10 cm From Medial Instep 27.9 cm ?Vascular Assessment ?Pulses: ?Dorsalis Pedis ?Palpable: [Left:Yes] ?Electronic Signature(s) ?Signed: 09/21/2021 5:16:22 PM By: Sharyn Creamer RN, BSN ?Entered By: Sharyn Creamer on 09/21/2021 13:03:18 ?-------------------------------------------------------------------------------- ?Multi Wound Chart Details ?Patient Name: ?Date of Service: ?[name], [name]. 09/21/2021 12:45 PM ?Medical Record Number: [id number] ?Patient Account Number: [id number] ?Date of Birth/Sex: ?Treating RN: ?07-27-1950 (71 y.o. [name] ?Primary Care Kyleigh Nannini: [name] ?Other Clinician: ?Referring Zyrus Hetland: ?Treating Lulu Hirschmann/Extender: Fredirick Maudlin ?[name] ?Weeks in Treatment: 23 ?Vital Signs ?Height(in): 74 ?Pulse(bpm): 59 ?Weight(lbs): 238 ?Blood Pressure(mmHg):  134/83 ?Body Mass Index(BMI): 30.6 ?Temperature(??F): 98.2 ?Respiratory Rate(breaths/min): 18 ?Photos: ?Left, Plantar Metatarsal head first Left, Lateral Lower Leg Left, Medial Upper Leg ?Wound Location: ?Gradually Appeared Bump Trauma ?Wounding Event: ?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 ?Primary Etiology: ?Extremity ?Glaucoma, Sleep Apnea, Glaucoma, Sleep Apnea, Glaucoma, Sleep Apnea, ?Comorbid History: ?Hypertension, Peripheral Arterial Hypertension, Peripheral Arterial Hypertension, Peripheral Arterial ?Disease, Peripheral Venous Disease, ?Disease, Peripheral Venous Disease, Disease, Peripheral Venous Disease, ?Type II Diabetes, Gout, Osteoarthritis, ?Type II Diabetes, Gout, Osteoarthritis, Type II Diabetes, Gout, Osteoarthritis, ?Neuropathy Neuropathy Neuropathy ?08/23/2020 06/03/2021 09/14/2021 ?Date Acquired: ?34 15 0 ?Weeks of Treatment: ?Open Open Open ?Wound Status: ?No No No ?Wound Recurrence: ?1x1.2x0.1 5.4x2.6x1 2.1x1x0.1 ?Measurements L x W x D (cm) ?0.942 11.027 1.649 ?A (cm?) : ?rea ?0.094 11.027 0.165 ?Volume (cm?) : ?93.30% -568.70% 0.00% ?% Reduction in A  rea: ?96.70% -736.00% 0.00% ?% Reduction in Volume: ?12 ?Position 1 (o'clock): ?13 ?Maximum Distance 1 (cm): ?No Yes No ?Tunneling: ?Grade 2 Full Thickness With Exposed Support Full Thickness Without Exposed ?Classification: ?Structures Support Structures ?Medium Large Medium ?Exudate A mount: ?Serosanguineous Serosanguineous Serosanguineous ?Exudate Type: ?red, brown red, brown red, brown ?Exudate Color: ?Thickened Well defined, not attached Flat and Intact ?Wound Margin: ?Large (67-100%) Large (67-100%) Large (67-100%) ?Granulation A mount: ?Red Red Red, Pink ?Granulation Quality: ?None Present (0%) None Present (0%) None Present (0%) ?Necrotic A mount: ?Fat Layer (Subcutaneous Tissue): Yes Fat Layer (Subcutaneous Tissue): Yes Fat Layer (Subcutaneous Tissue): Yes ?Exposed Structures: ?Fascia: No ?Fascia: No ?Fascia: No ?Tendon: No ?Tendon: No ?Tendon: No ?Muscle: No ?Muscle: No ?Muscle: No ?Joint: No ?Joint: No ?Joint: No ?Bone: No ?Bone: No ?Bone: No ?Small (1-33%) None Small (1-33%) ?Epithelialization: ?Debridement - Selective/Open Wound N/A N/A ?Debridement: ?Pre-procedure Verification/Time Out 13:28 N/A N/A ?Taken: ?Callus N/A N/A ?Tissue Debrided: ?Non-Viable Tissue N/A N/A ?Level: ?1.2 N/A N/A ?Debridement A (sq cm): ?rea ?Curette N/A N/A ?Instrument: ?Minimum N/A N/A ?Bleeding: ?Pressure N/A N/A ?Hemostasis A chieved: ?0 N/A N/A ?Procedural Pain: ?0 N/A N/A ?Post Procedural Pain: ?Procedure was tolerated well N/A N/A ?Debridement Treatment Response: ?1x1.2x0.1 N/A N/A ?Post Debridement Measurements L x ?W x D (cm) ?0.094 N/A N/A ?Post Debridement Volume: (cm?) ?Debridement Compression Therapy N/A ?Procedures Performed: ?Treatment Notes ?Electronic Signature(s) ?Signed: 09/21/2021 1:43:33 PM By: Fredirick Maudlin MD FACS ?Signed: 09/21/2021 5:30:36 PM By: Levan Hurst RN, BSN ?Entered By: Fredirick Maudlin on 09/21/2021  13:43:33 ?-------------------------------------------------------------------------------- ?Multi-Disciplinary Care Plan Details ?Patient Name: ?Date of Service: ?ZELL, HYLTON. 09/21/2021 12:45 PM ?Medical Record Number: [id number] ?Patient Account Number: [id number] ?Date of Birth/Sex: ?Treating RN: ?1

## 2021-09-21 NOTE — Telephone Encounter (Signed)
Patient is requesting a refill on one touch ultra test strips to be sent into [facility] on [address] in [place] ?

## 2021-09-21 NOTE — Progress Notes (Signed)
KIWAN, GADSDEN ([id number]) ?Visit Report for 09/21/2021 ?Chief Complaint Document Details ?Patient Name: Date of Service: ?[name], [name]. 09/21/2021 12:45 PM ?Medical Record Number: [id number] ?Patient Account Number: [id number] ?Date of Birth/Sex: Treating RN: ?[birth year] (71 y.o. [name] ?Primary Care Provider: Jilda [name] Other Clinician: ?Referring Provider: ?Treating Provider/Extender: [name] ?[name] ?Weeks in Treatment: 23 ?Information Obtained from: Patient ?Chief Complaint ?Left leg and foot ulcers ?04/12/2021; patient is here for wounds on his left lower leg and left plantar foot over the first metatarsal head ?Electronic Signature(s) ?Signed: 09/21/2021 1:47:06 PM By: [name] Maudlin MD FACS ?Entered By: [name] on 09/21/2021 13:47:06 ?-------------------------------------------------------------------------------- ?Debridement Details ?Patient Name: Date of Service: ?OTONIEL, MYHAND. 09/21/2021 12:45 PM ?Medical Record Number: [id number] ?Patient Account Number: [id number] ?Date of Birth/Sex: Treating RN: ?1950/09/08 (71 y.o. [name] ?Primary Care Provider: Jilda [name] Other Clinician: ?Referring Provider: ?Treating Provider/Extender: [name] ?[name] ?Weeks in Treatment: 23 ?Debridement Performed for Assessment: Wound #18 Left,Plantar Metatarsal head first ?Performed By: Physician [name] Maudlin, MD ?Debridement Type: Debridement ?Severity of Tissue Pre Debridement: Fat layer exposed ?Level of Consciousness (Pre-procedure): Awake and Alert ?Pre-procedure Verification/Time Out Yes - 13:28 ?Taken: ?Start Time: 13:28 ?T Area Debrided (L x W): ?otal 1 (cm) x 1.2 (cm) = 1.2 (cm?) ?Tissue and other material debrided: Non-Viable, Callus ?Level: Non-Viable Tissue ?Debridement Description: Selective/Open Wound ?Instrument: Curette ?Bleeding: Minimum ?Hemostasis Achieved: Pressure ?End Time: 13:30 ?Procedural Pain: 0 ?Post Procedural Pain: 0 ?Response to  Treatment: Procedure was tolerated well ?Level of Consciousness (Post- Awake and Alert ?procedure): ?Post Debridement Measurements of Total Wound ?Length: (cm) 1 ?Width: (cm) 1.2 ?Depth: (cm) 0.1 ?Volume: (cm?) 0.094 ?Character of Wound/Ulcer Post Debridement: Improved ?Severity of Tissue Post Debridement: Fat layer exposed ?Post Procedure Diagnosis ?Same as Pre-procedure ?Electronic Signature(s) ?Signed: 09/21/2021 5:30:36 PM By: [name] Hurst RN, BSN ?Signed: 09/21/2021 5:31:11 PM By: [name] Maudlin MD FACS ?Entered By: [name] on 09/21/2021 13:28:46 ?-------------------------------------------------------------------------------- ?HPI Details ?Patient Name: Date of Service: ?OMID, DEARDORFF. 09/21/2021 12:45 PM ?Medical Record Number: [id number] ?Patient Account Number: [id number] ?Date of Birth/Sex: Treating RN: ?1950/08/27 (71 y.o. [name] ?Primary Care Provider: Jilda [name] Other Clinician: ?Referring Provider: ?Treating Provider/Extender: [name] ?[name] ?Weeks in Treatment: 23 ?History of Present Illness ?HPI Description: 10/11/17; Mr. Jakubiak is a 71 year old man who tells me that in 2015 he slipped down the latter traumatizing his left leg. He developed a wound ?in the same spot the area that we are currently looking at. He states this closed over for the most part although he always felt it was somewhat unstable. In ?2016 he hit the same area with the door of his car had this reopened. He tells me that this is never really closed although sometimes an inflow it remains open ?on a constant basis. He has not been using any specific dressing to this except for topical antibiotics the nature of which were not really sure. ?His primary doctor did send him to see Dr. [name] of interventional cardiology. He underwent an angiogram on 08/06/17 and he underwent a PTA and directional ?atherectomy of the lesser distal SFA and popliteal arteries which resulted in brisk improvement in blood  flow. It was noted that he had 2 vessel runoff through ?the anterior tibial and peroneal. He is also been to see vascular and interventional radiologist. He was not felt to have any significant superficial venous ?insufficiency. Presumably is not a candidate for any ablation. It was suggested he come  here for wound care. ?The patient is a type II diabetic on insulin. He also has a history of venous insufficiency. ?ABIs on the left were noncompressible in our clinic ?10/21/17; patient we admitted to the clinic last week. 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 We ?put Iodosorb on him after an aggressive debridement and 3 layer compression. He complained of pain in his ankle and itching with is skin in fact he scratched ?the area on the medial calf superiorly at the rim of our wraps and he has 2 small open areas in that location today which are new. I changed his primary ?dressing today to silver collagen. As noted he is already had revascularization and does not have any significant superficial venous insufficiency that would ?be amenable to ablation ?10/28/17; patient admitted to the clinic 2 weeks ago. He has a smaller Wound. Scratch injury from last week revealed. There is large wound over the tibial area. ?This is smaller. Granulation looks healthy. No need for debridement. ?11/04/17; the wound on the left lateral calf looks better. Improved dimensions. Surface of this looks better. We've been maintaining him and Kerlix Coban ?wraps. He finds this much more comfortable. Silver collagen dressing ?11/11/17; left lateral Wound continues to look healthy be making progress. Using a #5 curet I removed removed nonviable skin from the surface of the wound ?and then necrotic debris from the wound surface. Surface of the wound continues to look healthy. ?He also has an open area on the left great toenail bed. We've been using topical antibiotics. ?11/19/17;  left anterior lateral wound continues to look healthy but it's not closed. ?He also had a small wound above this on the left leg ?Initially traumatic wounds in the setting of significant chronic venous insufficiency and stasis dermatitis ?11/25/17; left anterior wounds superiorly is closed still a small wound inferiorly. ?12/02/17; left anterior tibial area. Arrives today with adherent callus. Post debridement clearly not completely closed. Hydrofera Blue under 3 layer compression. ?12/09/17; left anterior tibia. Circumferential eschar however the wound bed looks stable to improved. We've been using Hydrofera Blue under 3 layer ?compression ?12/17/17; left anterior tibia. Apparently this was felt to be closed however when the wrap was taken off there is a skin tear to reopen wounds in the same area ?we've been using Hydrofera Blue under 3 layer compression ?12/23/17 left anterior tibia. Not close to close this week apparently the [facility] was stuck to this again. Still circumferential eschar requiring debridement. I ?put a contact layer on this this time under the Hydrofera Blue ?12/31/17; left anterior tibia. Wound is better slight amount of hyper-granulation. Using Hydrofera Blue over Adaptic. ?01/07/18; left anterior tibia. The wound had some surface eschar however after this was removed he has no open wound.he was already revascularized by Dr. ?Ganji when he came to our clinic with atherectomy of the left SFA and popliteal artery. He was also sent to interventional radiology for venous reflux studies. ?He was not felt to have significant reflux but certainly has chronic venous changes of his skin with hemosiderin deposition around this area. He will definitely ?need to lubricate his skin and wear compression stocking and I've talked to him about this. ?READMISSION ?05/26/2018 ?This is a now 71 year old man we cared for with traumatic wounds on his left anterior lower extremity. He had been previously revascularized  during that ?admission by Dr. [name]. Apparently in follow-up Dr. [name] noted that he had  deterioration in his arterial status. He underwent a stent placement in the distal left ?SFA on 04/22/2018. Unfortunately

## 2021-09-25 ENCOUNTER — Encounter (HOSPITAL_BASED_OUTPATIENT_CLINIC_OR_DEPARTMENT_OTHER): Payer: Medicare Other | Attending: General Surgery | Admitting: General Surgery

## 2021-09-25 DIAGNOSIS — E1151 Type 2 diabetes mellitus with diabetic peripheral angiopathy without gangrene: Secondary | ICD-10-CM | POA: Diagnosis not present

## 2021-09-25 DIAGNOSIS — E11621 Type 2 diabetes mellitus with foot ulcer: Secondary | ICD-10-CM | POA: Diagnosis present

## 2021-09-25 DIAGNOSIS — L97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: Secondary | ICD-10-CM | POA: Diagnosis not present

## 2021-09-25 DIAGNOSIS — Z794 Long term (current) use of insulin: Secondary | ICD-10-CM | POA: Diagnosis not present

## 2021-09-25 DIAGNOSIS — L97828 Non-pressure chronic ulcer of other part of left lower leg with other specified severity: Secondary | ICD-10-CM | POA: Insufficient documentation

## 2021-09-25 DIAGNOSIS — I129 Hypertensive chronic kidney disease with stage 1 through stage 4 chronic kidney disease, or unspecified chronic kidney disease: Secondary | ICD-10-CM | POA: Diagnosis not present

## 2021-09-25 DIAGNOSIS — I87322 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: Secondary | ICD-10-CM | POA: Insufficient documentation

## 2021-09-25 DIAGNOSIS — I890 Lymphedema, not elsewhere classified: Secondary | ICD-10-CM | POA: Insufficient documentation

## 2021-09-25 DIAGNOSIS — L97528 Non-pressure chronic ulcer of other part of left foot with other specified severity: Secondary | ICD-10-CM | POA: Diagnosis not present

## 2021-09-25 DIAGNOSIS — N183 Chronic kidney disease, stage 3 unspecified: Secondary | ICD-10-CM | POA: Diagnosis not present

## 2021-09-25 DIAGNOSIS — Z87891 Personal history of nicotine dependence: Secondary | ICD-10-CM | POA: Diagnosis not present

## 2021-09-25 DIAGNOSIS — E1122 Type 2 diabetes mellitus with diabetic chronic kidney disease: Secondary | ICD-10-CM | POA: Diagnosis not present

## 2021-09-25 NOTE — Progress Notes (Signed)
[name], [name] ([id number]) ?Visit Report for 09/25/2021 ?Arrival Information Details ?Patient Name: Date of Service: ?[name], [name] 09/25/2021 2:00 PM ?Medical Record Number: [id number] ?Patient Account Number: [id number] ?Date of Birth/Sex: Treating RN: ?1950/12/23 (71 y.o. [name]) Dellie Catholic ?Primary Care Shamera Yarberry: [name] Other Clinician: ?Referring Sherilynn Dieu: ?Treating Lucina Betty/Extender: Fredirick Maudlin ?[name] ?Weeks in Treatment: 23 ?Visit Information History Since Last Visit ?Added or deleted any medications: No ?Patient Arrived: Ambulatory ?Any new allergies or adverse reactions: No ?Arrival Time: 14:18 ?Had a fall or experienced change in No ?Accompanied By: self ?activities of daily living that may affect ?Transfer Assistance: None ?risk of falls: ?Patient Requires Transmission-Based Precautions: No ?Signs or symptoms of abuse/neglect since last visito No ?Patient Has Alerts: Yes ?Hospitalized since last visit: No ?Patient Alerts: ?ABI's: 09/22 L:1.09 R: 1. ?Implantable device outside of the clinic excluding No ?TBI's: R:0.72 L:0.99 ?cellular tissue based products placed in the center ?since last visit: ?Has Dressing in Place as Prescribed: Yes ?Has Compression in Place as Prescribed: Yes ?Pain Present Now: Yes ?Electronic Signature(s) ?Signed: 09/25/2021 5:12:48 PM By: Dellie Catholic RN ?Entered By: Dellie Catholic on 09/25/2021 14:19:26 ?-------------------------------------------------------------------------------- ?Compression Therapy Details ?Patient Name: Date of Service: ?[name], [name] 09/25/2021 2:00 PM ?Medical Record Number: [id number] ?Patient Account Number: [id number] ?Date of Birth/Sex: Treating RN: ?03-10-1951 (71 y.o. [name]) Dellie Catholic ?Primary Care Cameka Rae: [name] Other Clinician: ?Referring Mikeala Girdler: ?Treating Garan Frappier/Extender: Fredirick Maudlin ?[name] ?Weeks in Treatment: 23 ?Compression Therapy Performed for Wound Assessment: Wound #22 Left,Lateral Lower  Leg ?Performed By: Clinician Dellie Catholic, RN ?Compression Type: Double Layer ?Electronic Signature(s) ?Signed: 09/25/2021 5:12:48 PM By: Dellie Catholic RN ?Entered By: Dellie Catholic on 09/25/2021 15:19:27 ?-------------------------------------------------------------------------------- ?Compression Therapy Details ?Patient Name: Date of Service: ?[name], [name] 09/25/2021 2:00 PM ?Medical Record Number: [id number] ?Patient Account Number: [id number] ?Date of Birth/Sex: ?Treating RN: ?[birth year] (72 y.o. [name]) Dellie Catholic ?Primary Care Colman Birdwell: [name] ?Other Clinician: ?Referring Emon Miggins: ?Treating Fatou Dunnigan/Extender: Fredirick Maudlin ?[name] ?Weeks in Treatment: 23 ?Compression Therapy Performed for Wound Assessment: Wound #18 Left,Plantar Metatarsal head first ?Performed By: Clinician Dellie Catholic, RN ?Compression Type: Double Layer ?Electronic Signature(s) ?Signed: 09/25/2021 5:12:48 PM By: Dellie Catholic RN ?Entered By: Dellie Catholic on 09/25/2021 15:19:27 ?-------------------------------------------------------------------------------- ?Compression Therapy Details ?Patient Name: ?Date of Service: ?[name], [name] 09/25/2021 2:00 PM ?Medical Record Number: [id number] ?Patient Account Number: [id number] ?Date of Birth/Sex: ?Treating RN: ?22-Oct-1950 (71 y.o. [name]) Dellie Catholic ?Primary Care Massie Mees: [name] ?Other Clinician: ?Referring Meleana Commerford: ?Treating Aldwin Micalizzi/Extender: Fredirick Maudlin ?[name] ?Weeks in Treatment: 23 ?Compression Therapy Performed for Wound Assessment: Wound #24 Left,Medial Upper Leg ?Performed By: Clinician Dellie Catholic, RN ?Compression Type: Double Layer ?Electronic Signature(s) ?Signed: 09/25/2021 5:12:48 PM By: Dellie Catholic RN ?Entered By: Dellie Catholic on 09/25/2021 15:19:27 ?-------------------------------------------------------------------------------- ?Encounter Discharge Information Details ?Patient Name: ?Date of Service: ?[name], [name] 09/25/2021  2:00 PM ?Medical Record Number: [id number] ?Patient Account Number: [id number] ?Date of Birth/Sex: ?Treating RN: ?11-23-1950 (71 y.o. [name]) Dellie Catholic ?Primary Care Soua Caltagirone: [name] ?Other Clinician: ?Referring Devota Viruet: ?Treating Nichole Neyer/Extender: Fredirick Maudlin ?[name] ?Weeks in Treatment: 23 ?Encounter Discharge Information Items ?Discharge Condition: Stable ?Ambulatory Status: Ambulatory ?Discharge Destination: Home ?Transportation: Private Auto ?Accompanied By: self ?Schedule Follow-up Appointment: Yes ?Clinical Summary of Care: Patient Declined ?Electronic Signature(s) ?Signed: 09/25/2021 5:12:48 PM By: Dellie Catholic RN ?Entered By: Dellie Catholic on 09/25/2021 17:10:09 ?-------------------------------------------------------------------------------- ?Negative Pressure Wound Therapy Maintenance (NPWT) Details ?Patient Name: ?Date of Service: ?[name], [name] 09/25/2021 2:00 PM ?Medical Record Number:  [id number] ?Patient Account Number: [id number] ?Date of Birth/Sex: ?Treating RN: ?[birth year] (71 y.o. [name]) Dellie Catholic ?Primary Care Nickie [name]: [name] ?Other Clinician: ?Referring Ivone Licht: ?Treating Vaishali Baise/Extender: Fredirick Maudlin ?[name] ?Weeks in Treatment: 23 ?NPWT Maintenance Performed for: Wound #22 Left, Lateral Lower Leg ?Additional Injuries Covered: No ?Performed By: Dellie Catholic, RN ?Type: VAC System ?Coverage Size (sq cm): 14.04 ?Pressure Type: Constant ?Pressure Setting: 125 mmHG ?Drain Type: None ?Primary Contact: Non-Adherent ?Sponge/Dressing Type: ?Combination : white foam and black sponge ?Date Initiated: 07/27/2021 ?Dressing Removed: No ?Quantity of Sponges/Gauze Removed: 2 ?Canister Changed: No ?Dressing Reapplied: No ?Quantity of Sponges/Gauze Inserted: 2 ?Respones T Treatment: ?o Patient tolerated well ?Days On NPWT : 61 ?Electronic Signature(s) ?Signed: 09/25/2021 5:12:48 PM By: Dellie Catholic RN ?Entered By: Dellie Catholic on 09/25/2021  17:07:22 ?-------------------------------------------------------------------------------- ?Patient/Caregiver Education Details ?Patient Name: ?Date of Service: ?[name], [name] Footman. 4/3/2023andnbsp2:00 PM ?Medical Record Number: [id number] ?Patient Account Number: [id number] ?Date of Birth/Gender: ?Treating RN: ?1950-09-20 (71 y.o. [name]) Dellie Catholic ?Primary Care Physician: [name] ?Other Clinician: ?Referring Physician: ?Treating Physician/Extender: Fredirick Maudlin ?[name] ?Weeks in Treatment: 23 ?Education Assessment ?Education Provided To: ?Patient ?Education Topics Provided ?Wound/Skin Impairment: ?Methods: Explain/Verbal ?Responses: Return demonstration correctly ?Electronic Signature(s) ?Signed: 09/25/2021 5:12:48 PM By: Dellie Catholic RN ?Entered By: Dellie Catholic on 09/25/2021 17:08:45 ?-------------------------------------------------------------------------------- ?Wound Assessment Details ?Patient Name: ?Date of Service: ?[name], [name] 09/25/2021 2:00 PM ?Medical Record Number: [id number] ?Patient Account Number: [id number] ?Date of Birth/Sex: ?Treating RN: ?[birth year] (71 y.o. [name]) Dellie Catholic ?Primary Care [name] Dada: [name] ?Other Clinician: ?Referring Renton Berkley: ?Treating Nataleah Scioneaux/Extender: Fredirick Maudlin ?[name] ?Weeks in Treatment: 23 ?Wound Status ?Wound Number: 18 Primary Diabetic Wound/Ulcer of the Lower Extremity ?Etiology: ?Wound Location: Left, Plantar Metatarsal head first ?Wound Open ?Wounding Event: Gradually Appeared ?Status: ?Date Acquired: 08/23/2020 ?Comorbid Glaucoma, Sleep Apnea, Hypertension, Peripheral Arterial Disease, ?Weeks Of Treatment: 23 ?History: Peripheral Venous Disease, Type II Diabetes, Gout, Osteoarthritis, ?Clustered Wound: No Neuropathy ?Wound Measurements ?Length: (cm) 1 ?Width: (cm) 1.2 ?Depth: (cm) 0.1 ?Area: (cm?) 0.942 ?Volume: (cm?) 0.094 ?% Reduction in Area: 93.3% ?% Reduction in Volume: 96.7% ?Epithelialization: Small (1-33%) ?Tunneling:  No ?Undermining: No ?Wound Description ?Classification: Grade 2 ?Wound Margin: Thickened ?Exudate Amount: Medium ?Exudate Type: Serosanguineous ?Exudate Color: red, brown ?Foul Odor After Cleansing: No ?Slough/Fibrino No ?Wound Bed ?Granulatio

## 2021-09-25 NOTE — Telephone Encounter (Signed)
Called [facility] and spoke to [place] and let her know that the transmitter was not sent to the patient and he stated that the pharmacy stated the Transmitter still needed a PA. Colletta [place] stated that this was approved at the same time as his sensors for the Dexcom G6. ? ?I called the [facility] and spoke to [place] and she stated that they had to rerun the order and it went through and they are sending out to the patient. ? ?I called the patient to let him know this and he verbalized an understanding and thanked me. ?

## 2021-09-26 NOTE — Progress Notes (Signed)
KENSON, GROH ([id number]) ?Visit Report for 09/25/2021 ?SuperBill Details ?Patient Name: Date of Service: ?[name], [name]. 09/25/2021 ?Medical Record Number: [id number] ?Patient Account Number: [id number] ?Date of Birth/Sex: Treating RN: ?[birth year] (71 y.o. [name]) Dellie Catholic ?Primary Care Provider: [name] Other Clinician: ?Referring Provider: ?Treating Provider/Extender: Fredirick Maudlin ?[name] ?Weeks in Treatment: 23 ?Diagnosis Coding ?ICD-10 Codes ?Code Description ?E11.621 Type 2 diabetes mellitus with foot ulcer ?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 ?I89.0 Lymphedema, not elsewhere classified ?I71.245 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 ?Y09.983 Non-pressure chronic ulcer of other part of left lower leg with other specified severity ?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 of other part of left foot with other specified severity ?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 ?Facility Procedures ?CPT4 Code Description Modifier Quantity ?38250539 76734 - WOUND VAC-50 SQ CM OR LESS 1 ?19379024 (Facility Use Only) 29581LT - APPLY MULTLAY COMPRS LWR LT LEG 59 1 ?Electronic Signature(s) ?Signed: 09/25/2021 5:12:48 PM By: Dellie Catholic RN ?Signed: 09/26/2021 7:43:28 AM By: Fredirick Maudlin MD FACS ?Entered By: Dellie Catholic on 09/25/2021 17:12:22 ?

## 2021-09-28 ENCOUNTER — Encounter (HOSPITAL_BASED_OUTPATIENT_CLINIC_OR_DEPARTMENT_OTHER): Payer: Medicare Other | Admitting: General Surgery

## 2021-09-28 DIAGNOSIS — E11621 Type 2 diabetes mellitus with foot ulcer: Secondary | ICD-10-CM | POA: Diagnosis not present

## 2021-10-02 ENCOUNTER — Other Ambulatory Visit (HOSPITAL_BASED_OUTPATIENT_CLINIC_OR_DEPARTMENT_OTHER): Payer: Self-pay

## 2021-10-02 ENCOUNTER — Encounter (HOSPITAL_BASED_OUTPATIENT_CLINIC_OR_DEPARTMENT_OTHER): Payer: Medicare Other | Admitting: General Surgery

## 2021-10-02 ENCOUNTER — Ambulatory Visit: Payer: BC Managed Care – PPO | Attending: Internal Medicine

## 2021-10-02 DIAGNOSIS — Z23 Encounter for immunization: Secondary | ICD-10-CM

## 2021-10-02 DIAGNOSIS — E11621 Type 2 diabetes mellitus with foot ulcer: Secondary | ICD-10-CM | POA: Diagnosis not present

## 2021-10-03 ENCOUNTER — Other Ambulatory Visit (HOSPITAL_BASED_OUTPATIENT_CLINIC_OR_DEPARTMENT_OTHER): Payer: Self-pay

## 2021-10-03 MED ORDER — PFIZER COVID-19 VAC BIVALENT 30 MCG/0.3ML IM SUSP
INTRAMUSCULAR | 0 refills | Status: DC
  Filled 2021-10-03: qty 0.3, 1d supply, fill #0

## 2021-10-03 NOTE — Progress Notes (Signed)
EDAHI, KROENING ([id number]) ?Visit Report for 10/02/2021 ?Arrival Information Details ?Patient Name: Date of Service: ?[name], [name] 10/02/2021 2:00 PM ?Medical Record Number: [id number] ?Patient Account Number: [id number] ?Date of Birth/Sex: Treating RN: ?[birth year] (71 y.o. M) ?Primary Care Cameryn Schum: [name] Other Clinician: ?Referring Darvin Dials: ?Treating Brannen Koppen/Extender: Fredirick Maudlin ?[name] ?Weeks in Treatment: 24 ?Visit Information History Since Last Visit ?Added or deleted any medications: No ?Patient Arrived: Ambulatory ?Any new allergies or adverse reactions: No ?Arrival Time: 14:12 ?Had a fall or experienced change in No ?Accompanied By: self ?activities of daily living that may affect ?Transfer Assistance: None ?risk of falls: ?Patient Identification Verified: Yes ?Signs or symptoms of abuse/neglect since last visito No ?Secondary Verification Process Completed: Yes ?Hospitalized since last visit: No ?Patient Requires Transmission-Based Precautions: No ?Implantable device outside of the clinic excluding No ?Patient Has Alerts: Yes ?cellular tissue based products placed in the center ?Patient Alerts: ?ABI's: 09/22 L:1.09 R: 1. since last visit: ?TBI's: R:0.72 L:0.99 ?Has Dressing in Place as Prescribed: Yes ?Pain Present Now: No ?Electronic Signature(s) ?Signed: 10/03/2021 8:49:12 AM By: Sandre Kitty ?Entered By: Sandre Kitty on 10/02/2021 14:13:05 ?-------------------------------------------------------------------------------- ?Lower Extremity Assessment Details ?Patient Name: Date of Service: ?[name], [name] 10/02/2021 2:00 PM ?Medical Record Number: [id number] ?Patient Account Number: [id number] ?Date of Birth/Sex: Treating RN: ?1951-02-18 (71 y.o. [name] ?Primary Care Medea Deines: [name] Other Clinician: ?Referring Byron Peacock: ?Treating Oluwanifemi Susman/Extender: Fredirick Maudlin ?[name] ?Weeks in Treatment: 24 ?Edema Assessment ?Assessed: [Left: No] [Right: No] ?Edema:  [Left: Ye] [Right: s] ?Calf ?Left: Right: ?Point of Measurement: 41 cm From Medial Instep 42.5 cm ?Ankle ?Left: Right: ?Point of Measurement: 10 cm From Medial Instep 28 cm ?Vascular Assessment ?Pulses: ?Dorsalis Pedis ?Palpable: [Left:Yes] ?Electronic Signature(s) ?Signed: 10/02/2021 6:48:31 PM By: Baruch Gouty RN, BSN ?Entered By: Baruch Gouty on 10/02/2021 14:54:23 ?-------------------------------------------------------------------------------- ?Multi Wound Chart Details ?Patient Name: ?Date of Service: ?[name], [name] 10/02/2021 2:00 PM ?Medical Record Number: [id number] ?Patient Account Number: [id number] ?Date of Birth/Sex: ?Treating RN: ?[birth year] (71 y.o. [name] ?Primary Care Yahye Siebert: [name] ?Other Clinician: ?Referring Delbert Darley: ?Treating Halley Shepheard/Extender: Fredirick Maudlin ?[name] ?Weeks in Treatment: 24 ?Vital Signs ?Height(in): 74 ?Pulse(bpm): 76 ?Weight(lbs): 238 ?Blood Pressure(mmHg): 167/79 ?Body Mass Index(BMI): 30.6 ?Temperature(??F): 98.2 ?Respiratory Rate(breaths/min): 16 ?Photos: [18:No Photos Left, Plantar Metatarsal head first] [22:No Photos Left, Lateral Lower Leg] [24:No Photos Left, Medial Upper Leg] ?Wound Location: [18:Gradually Appeared] [22:Bump] [24:Trauma] ?Wounding Event: [18:Diabetic Wound/Ulcer of the Lower] [22:Cyst] [24:Abrasion] ?Primary Etiology: [18:Extremity Glaucoma, Sleep Apnea,] [22:Glaucoma, Sleep Apnea,] [24:Glaucoma, Sleep Apnea,] ?Comorbid History: [18:Hypertension, Peripheral ArterialHypertension, Peripheral Arterial Disease, Peripheral Venous Disease, Type II Diabetes, Gout, Osteoarthritis, Neuropathy 08/23/2020] [22:Neuropathy 06/03/2021] [24:Hypertension, Peripheral Arterial  ?Disease, Peripheral Venous Disease, Type II Diabetes, Gout, Osteoarthritis, Neuropathy 09/14/2021] ?Date Acquired: [18:24] [22:17] [24:1] ?Weeks of Treatment: [18:Open] [22:Open] [24:Open] ?Wound Status: [18:No] [22:No] [24:No] ?Wound Recurrence: [18:1.4x1.9x0.2]  [22:5.1x2.4x1] [24:1.6x1.2x0.1] ?Measurements L x W x D (cm) [18:2.089] [22:9.613] [24:1.508] ?A (cm?) : ?rea [18:0.418] [63:8.756] [24:0.151] ?Volume (cm?) : [18:85.20%] [22:-483.00%] [24:8.60%] ?% Reduction in A [18:rea: 85.20%] [22:-628.80%] [24:8.50%] ?% Reduction in Volume: [18:Grade 2] [22:Full Thickness With Exposed Support Full Thickness Without Exposed] ?Classification: [18:Medium] [22:Structures Large] [24:Support Structures Medium] ?Exudate A mount: [18:Serosanguineous] [22:Serosanguineous] [24:Serosanguineous] ?Exudate Type: [18:red, brown] [22:red, brown] [24:red, brown] ?Exudate Color: [18:Thickened] [22:Well defined, not attached] [24:Flat and Intact] ?Wound Margin: [18:Large (67-100%)] [22:Large (67-100%)] [24:Large (67-100%)] ?Granulation A mount: [18:Red] [22:Red] [24:Red, Pink] ?Granulation Quality: [18:None Present (0%)] [22:None Present (0%)] [24:None Present (0%)] ?Necrotic A mount: ?[  18:Fat Layer (Subcutaneous Tissue): Yes Fat Layer (Subcutaneous Tissue): Yes Fat Layer (Subcutaneous Tissue): Yes] ?Exposed Structures: ?[18:Fascia: No Tendon: No Muscle: No Joint: No Bone: No Small (1-33%)] [22:Fascia: No Tendon: No Muscle: No Joint: No Bone: No Small (1-33%)] [24:Fascia: No Tendon: No Muscle: No Joint: No Bone: No Small (1-33%)] ?Epithelialization: [18:N/A] [22:N/A] [24:Chemical/Enzymatic/Mechanical] ?Debridement: [18:N/A] [22:N/A] [24:N/A] ?Instrument: [18:N/A] [22:N/A] [24:None] ?Bleeding: ?Debridement Treatment Response: N/A [22:N/A] [24:Procedure was tolerated well] ?Post Debridement Measurements L x N/A [22:N/A] [24:1x1.2x0.1] ?W x D (cm) [18:N/A] [22:N/A] [24:0.094] ?Post Debridement Volume: (cm?) [18:T Contact Cast otal] [22:N/A] [24:Debridement] ?Treatment Notes ?Electronic Signature(s) ?Signed: 10/02/2021 6:48:31 PM By: Baruch Gouty RN, BSN ?Previous Signature: 10/02/2021 4:02:05 PM Version By: Fredirick Maudlin MD FACS ?Entered By: Baruch Gouty on 10/02/2021  18:43:02 ?-------------------------------------------------------------------------------- ?Multi-Disciplinary Care Plan Details ?Patient Name: ?Date of Service: ?[name], [name] 10/02/2021 2:00 PM ?Medical Record Number: [id number] ?Patient Account Number: [id number] ?Date of Birth/Sex: ?Treating RN: ?Jun 22, 1951 (71 y.o. [name] ?Primary Care Tra Wilemon: [name] ?Other Clinician: ?Referring Virgle Arth: ?Treating Montrae Braithwaite/Extender: Fredirick Maudlin ?[name] ?Weeks in Treatment: 24 ?Multidisciplinary Care Plan reviewed with physician ?Active Inactive ?Venous Leg Ulcer ?Nursing Diagnoses: ?Knowledge deficit related to disease process and management ?Potential for venous Insuffiency (use before diagnosis confirmed) ?Goals: ?Patient will maintain optimal edema control ?Date Initiated: 07/27/2021 ?Target Resolution Date: 10/20/2021 ?Goal Status: Active ?Interventions: ?Assess peripheral edema status every visit. ?Treatment Activities: ?Therapeutic compression applied : 07/27/2021 ?Notes: ?Wound/Skin Impairment ?Nursing Diagnoses: ?Impaired tissue integrity ?Knowledge deficit related to ulceration/compromised skin integrity ?Goals: ?Patient will have a decrease in wound volume by X% from date: (specify in notes) ?Date Initiated: 04/12/2021 ?Date Inactivated: 04/27/2021 ?Target Resolution Date: 04/23/2021 ?Goal Status: Met ?Patient/caregiver will verbalize understanding of skin care regimen ?Date Initiated: 04/12/2021 ?Target Resolution Date: 10/20/2021 ?Goal Status: Active ?Ulcer/skin breakdown will have a volume reduction of 30% by week 4 ?Date Initiated: 04/12/2021 ?Date Inactivated: 04/27/2021 ?Target Resolution Date: 04/27/2021 ?Goal Status: Unmet ?Unmet Reason: infection ?Ulcer/skin breakdown will have a volume reduction of 50% by week 8 ?Date Initiated: 04/27/2021 ?Date Inactivated: 06/29/2021 ?Target Resolution Date: 06/24/2021 ?Goal Status: Met ?Interventions: ?Assess patient/caregiver ability to obtain  necessary supplies ?Assess patient/caregiver ability to perform ulcer/skin care regimen upon admission and as needed ?Assess ulceration(s) every visit ?Notes: ?Electronic Signature(s) ?Signed: 10/02/2021 6:48:31 PM By: Baruch Gouty RN

## 2021-10-03 NOTE — Progress Notes (Signed)
? ?  Covid-19 Vaccination Clinic ? ?Name:  [name]    ?MRN: [id number] ?DOB: Nov 14, 1950 ? ?10/03/2021 ? ?[name] was observed post Covid-19 immunization for 15 minutes without incident. He was provided with Vaccine Information Sheet and instruction to access the V-Safe system.  ? ?[name] was instructed to call 911 with any severe reactions post vaccine: ?Difficulty breathing  ?Swelling of face and throat  ?A fast heartbeat  ?A bad rash all over body  ?Dizziness and weakness  ? ?Immunizations Administered   ? ? Name Date Dose VIS Date Route  ? Ambulance person Booster 10/02/2021 10:35 AM 0.3 mL 02/22/2021 Intramuscular  ? Manufacturer: [employer]: [id number]  ? Reform: [phone]  ? ?  ? ? ?

## 2021-10-03 NOTE — Progress Notes (Signed)
BARAA, TUBBS ([id number]) ?Visit Report for 10/02/2021 ?Chief Complaint Document Details ?Patient Name: Date of Service: ?[name], [name] 10/02/2021 2:00 PM ?Medical Record Number: [id number] ?Patient Account Number: [id number] ?Date of Birth/Sex: Treating RN: ?12-02-1950 (71 y.o. [name] ?Primary Care Provider: [name] Other Clinician: ?Referring Provider: ?Treating Provider/Extender: Fredirick Maudlin ?[name] ?Weeks in Treatment: 24 ?Information Obtained from: Patient ?Chief Complaint ?Left leg and foot ulcers ?04/12/2021; patient is here for wounds on his left lower leg and left plantar foot over the first metatarsal head ?Electronic Signature(s) ?Signed: 10/02/2021 4:09:39 PM By: Fredirick Maudlin MD FACS ?Entered By: Fredirick Maudlin on 10/02/2021 16:09:38 ?-------------------------------------------------------------------------------- ?Debridement Details ?Patient Name: Date of Service: ?[name], [name] 10/02/2021 2:00 PM ?Medical Record Number: [id number] ?Patient Account Number: [id number] ?Date of Birth/Sex: Treating RN: ?12-19-1950 (71 y.o. [name] ?Primary Care Provider: [name] Other Clinician: ?Referring Provider: ?Treating Provider/Extender: Fredirick Maudlin ?[name] ?Weeks in Treatment: 24 ?Debridement Performed for Assessment: Wound #24 Left,Medial Upper Leg ?Performed By: Clinician Baruch Gouty, RN ?Debridement Type: Chemical/Enzymatic/Mechanical ?Agent Used: gauze and wound cleanser ?Level of Consciousness (Pre-procedure): Awake and Alert ?Pre-procedure Verification/Time Out No ?Taken: ?Bleeding: None ?Response to Treatment: Procedure was tolerated well ?Level of Consciousness (Post- Awake and Alert ?procedure): ?Post Debridement Measurements of Total Wound ?Length: (cm) 1 ?Width: (cm) 1.2 ?Depth: (cm) 0.1 ?Volume: (cm?) 0.094 ?Character of Wound/Ulcer Post Debridement: Improved ?Post Procedure Diagnosis ?Same as Pre-procedure ?Electronic Signature(s) ?Signed:  10/02/2021 6:48:31 PM By: Baruch Gouty RN, BSN ?Signed: 10/03/2021 12:21:41 PM By: Fredirick Maudlin MD FACS ?Entered By: Baruch Gouty on 10/02/2021 18:41:26 ?-------------------------------------------------------------------------------- ?HPI Details ?Patient Name: Date of Service: ?[name], [name] 10/02/2021 2:00 PM ?Medical Record Number: [id number] ?Patient Account Number: [id number] ?Date of Birth/Sex: Treating RN: ?1951/05/31 (71 y.o. [name] ?Primary Care Provider: [name] Other Clinician: ?Referring Provider: ?Treating Provider/Extender: Fredirick Maudlin ?[name] ?Weeks in Treatment: 24 ?History of Present Illness ?HPI Description: 10/11/17; Mr. Mcandrew is a 71 year old man who tells me that in 2015 he slipped down the latter traumatizing his left leg. He developed a wound ?in the same spot the area that we are currently looking at. He states this closed over for the most part although he always felt it was somewhat unstable. In ?2016 he hit the same area with the door of his car had this reopened. He tells me that this is never really closed although sometimes an inflow it remains open ?on a constant basis. He has not been using any specific dressing to this except for topical antibiotics the nature of which were not really sure. ?His primary doctor did send him to see Dr. [name] of interventional cardiology. He underwent an angiogram on 08/06/17 and he underwent a PTA and directional ?atherectomy of the lesser distal SFA and popliteal arteries which resulted in brisk improvement in blood flow. It was noted that he had 2 vessel runoff through ?the anterior tibial and peroneal. He is also been to see vascular and interventional radiologist. He was not felt to have any significant superficial venous ?insufficiency. Presumably is not a candidate for any ablation. It was suggested he come here for wound care. ?The patient is a type II diabetic on insulin. He also has a history of venous  insufficiency. ?ABIs on the left were noncompressible in our clinic ?10/21/17; patient we admitted to the clinic last week. 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 We ?put  Iodosorb on him after an aggressive debridement and 3 layer compression. He complained of pain in his ankle and itching with is skin in fact he scratched ?the area on the medial calf superiorly at the rim of our wraps and he has 2 small open areas in that location today which are new. I changed his primary ?dressing today to silver collagen. As noted he is already had revascularization and does not have any significant superficial venous insufficiency that would ?be amenable to ablation ?10/28/17; patient admitted to the clinic 2 weeks ago. He has a smaller Wound. Scratch injury from last week revealed. There is large wound over the tibial area. ?This is smaller. Granulation looks healthy. No need for debridement. ?11/04/17; the wound on the left lateral calf looks better. Improved dimensions. Surface of this looks better. We've been maintaining him and Kerlix Coban ?wraps. He finds this much more comfortable. Silver collagen dressing ?11/11/17; left lateral Wound continues to look healthy be making progress. Using a #5 curet I removed removed nonviable skin from the surface of the wound ?and then necrotic debris from the wound surface. Surface of the wound continues to look healthy. ?He also has an open area on the left great toenail bed. We've been using topical antibiotics. ?11/19/17; left anterior lateral wound continues to look healthy but it's not closed. ?He also had a small wound above this on the left leg ?Initially traumatic wounds in the setting of significant chronic venous insufficiency and stasis dermatitis ?11/25/17; left anterior wounds superiorly is closed still a small wound inferiorly. ?12/02/17; left anterior tibial area. Arrives today with adherent callus. Post  debridement clearly not completely closed. Hydrofera Blue under 3 layer compression. ?12/09/17; left anterior tibia. Circumferential eschar however the wound bed looks stable to improved. We've been using Hydrofera Blue under 3 layer ?compression ?12/17/17; left anterior tibia. Apparently this was felt to be closed however when the wrap was taken off there is a skin tear to reopen wounds in the same area ?we've been using Hydrofera Blue under 3 layer compression ?12/23/17 left anterior tibia. Not close to close this week apparently the [facility] was stuck to this again. Still circumferential eschar requiring debridement. I ?put a contact layer on this this time under the Hydrofera Blue ?12/31/17; left anterior tibia. Wound is better slight amount of hyper-granulation. Using Hydrofera Blue over Adaptic. ?01/07/18; left anterior tibia. The wound had some surface eschar however after this was removed he has no open wound.he was already revascularized by Dr. ?Ganji when he came to our clinic with atherectomy of the left SFA and popliteal artery. He was also sent to interventional radiology for venous reflux studies. ?He was not felt to have significant reflux but certainly has chronic venous changes of his skin with hemosiderin deposition around this area. He will definitely ?need to lubricate his skin and wear compression stocking and I've talked to him about this. ?READMISSION ?05/26/2018 ?This is a now 71 year old man we cared for with traumatic wounds on his left anterior lower extremity. He had been previously revascularized during that ?admission by Dr. [name]. Apparently in follow-up Dr. [name] noted that he had deterioration in his arterial status. He underwent a stent placement in the distal left ?SFA on 04/22/2018. 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 He went back to the angiography suite on 04/30/2018 he underwent PTA and ?balloon angioplasty of the occluded left mid anterior tibial artery,  thrombotic occlusion went from 100 to 0% which reconstitutes the posterior tibial artery.  He ?had thrombectomy and aspiration of the peroneal artery. The stent placed in the distal SFA left SFA was still occluded. H

## 2021-10-05 ENCOUNTER — Encounter (HOSPITAL_BASED_OUTPATIENT_CLINIC_OR_DEPARTMENT_OTHER): Payer: Medicare Other | Admitting: General Surgery

## 2021-10-05 DIAGNOSIS — E11621 Type 2 diabetes mellitus with foot ulcer: Secondary | ICD-10-CM | POA: Diagnosis not present

## 2021-10-09 ENCOUNTER — Encounter (HOSPITAL_BASED_OUTPATIENT_CLINIC_OR_DEPARTMENT_OTHER): Payer: Medicare Other | Admitting: General Surgery

## 2021-10-09 DIAGNOSIS — E11621 Type 2 diabetes mellitus with foot ulcer: Secondary | ICD-10-CM | POA: Diagnosis not present

## 2021-10-09 NOTE — Progress Notes (Signed)
[name] ([id number]) ?Visit Report for 10/05/2021 ?Chief Complaint Document Details ?Patient Name: Date of Service: ?[name], [name] 10/05/2021 10:00 A M ?Medical Record Number: [id number] ?Patient Account Number: [id number] ?Date of Birth/Sex: Treating RN: ?[birth year] (71 y.o. [name] ?Primary Care Provider: [name] Other Clinician: ?Referring Provider: ?Treating Provider/Extender: [name] ?[name] ?Weeks in Treatment: 25 ?Information Obtained from: Patient ?Chief Complaint ?Left leg and foot ulcers ?04/12/2021; patient is here for wounds on his left lower leg and left plantar foot over the first metatarsal head ?Electronic Signature(s) ?Signed: 10/05/2021 12:39:54 PM By: [name] Maudlin MD FACS ?Entered By: [name] on 10/05/2021 12:39:54 ?-------------------------------------------------------------------------------- ?Debridement Details ?Patient Name: Date of Service: ?[name], [name] 10/05/2021 10:00 A M ?Medical Record Number: [id number] ?Patient Account Number: [id number] ?Date of Birth/Sex: Treating RN: ?10-Aug-1950 (71 y.o. [name] ?Primary Care Provider: [name] Other Clinician: ?Referring Provider: ?Treating Provider/Extender: [name] ?[name] ?Weeks in Treatment: 25 ?Debridement Performed for Assessment: Wound #18 Left,Plantar Metatarsal head first ?Performed By: Physician [name] Maudlin, MD ?Debridement Type: Debridement ?Severity of Tissue Pre Debridement: Fat layer exposed ?Level of Consciousness (Pre-procedure): Awake and Alert ?Pre-procedure Verification/Time Out Yes - 11:09 ?Taken: ?Start Time: 11:09 ?Pain Control: ?Other : benzocaine ?T Area Debrided (L x W): ?otal 2 (cm) x 2.8 (cm) = 5.6 (cm?) ?Tissue and other material debrided: Non-Viable, Callus, Slough, Rives ?Level: Non-Viable Tissue ?Debridement Description: Selective/Open Wound ?Instrument: Curette ?Bleeding: Minimum ?Hemostasis Achieved: Pressure ?Procedural Pain:  0 ?Post Procedural Pain: 0 ?Response to Treatment: Procedure was tolerated well ?Level of Consciousness (Post- Awake and Alert ?procedure): ?Post Debridement Measurements of Total Wound ?Length: (cm) 2 ?Width: (cm) 2.8 ?Depth: (cm) 0.2 ?Volume: (cm?) 0.88 ?Character of Wound/Ulcer Post Debridement: Improved ?Severity of Tissue Post Debridement: Fat layer exposed ?Post Procedure Diagnosis ?Same as Pre-procedure ?Electronic Signature(s) ?Signed: 10/05/2021 1:06:36 PM By: [name] Maudlin MD FACS ?Signed: 10/05/2021 5:23:26 PM By: Sharyn Creamer RN, BSN ?Entered By: Sharyn Creamer on 10/05/2021 11:12:48 ?-------------------------------------------------------------------------------- ?Debridement Details ?Patient Name: ?Date of Service: ?[name], [name] 10/05/2021 10:00 A M ?Medical Record Number: [id number] ?Patient Account Number: [id number] ?Date of Birth/Sex: ?Treating RN: ?24-Aug-1950 (71 y.o. [name] ?Primary Care Provider: [name] ?Other Clinician: ?Referring Provider: ?Treating Provider/Extender: [name] ?[name] ?Weeks in Treatment: 25 ?Debridement Performed for Assessment: Wound #24 Left,Medial Upper Leg ?Performed By: Physician [name] Maudlin, MD ?Debridement Type: Debridement ?Level of Consciousness (Pre-procedure): Awake and Alert ?Pre-procedure Verification/Time Out Yes - 11:09 ?Taken: ?Start Time: 11:09 ?Pain Control: ?Other : benzocaine ?T Area Debrided (L x W): ?otal 0.5 (cm) x 0.6 (cm) = 0.3 (cm?) ?Tissue and other material debrided: Non-Viable, Eschar ?Level: Non-Viable Tissue ?Debridement Description: Selective/Open Wound ?Instrument: Curette ?Bleeding: Minimum ?Hemostasis Achieved: Pressure ?Procedural Pain: 0 ?Post Procedural Pain: 0 ?Response to Treatment: Procedure was tolerated well ?Level of Consciousness (Post- Awake and Alert ?procedure): ?Post Debridement Measurements of Total Wound ?Length: (cm) 1.1 ?Width: (cm) 0.6 ?Depth: (cm) 0.1 ?Volume: (cm?) 0.052 ?Character  of Wound/Ulcer Post Debridement: Improved ?Post Procedure Diagnosis ?Same as Pre-procedure ?Electronic Signature(s) ?Signed: 10/05/2021 1:06:36 PM By: [name] Maudlin MD FACS ?Signed: 10/05/2021 5:23:26 PM By: Sharyn Creamer RN, BSN ?Entered By: Sharyn Creamer on 10/05/2021 11:14:29 ?-------------------------------------------------------------------------------- ?HPI Details ?Patient Name: ?Date of Service: ?[name], [name] 10/05/2021 10:00 A M ?Medical Record Number: [id number] ?Patient Account Number: [id number] ?Date of Birth/Sex: Treating RN: ?January 25, 1951 (71 y.o. [name] ?Primary Care Provider: Other Clinician: ?[name] ?Referring Provider: ?Treating Provider/Extender: [name] ?[name],  [name] ?Weeks in Treatment: 25 ?History of Present Illness ?HPI Description: 10/11/17; [name] is a 71 year old man who tells me that in 2015 he slipped down the latter traumatizing his left leg. He developed a wound ?in the same spot the area that we are currently looking at. He states this closed over for the most part although he always felt it was somewhat unstable. In ?2016 he hit the same area with the door of his car had this reopened. He tells me that this is never really closed although sometimes an inflow it remains open ?on a constant basis. He has not been using any specific dressing to this except for topical antibiotics the nature of which were not really sure. ?His primary doctor did send him to see Dr. [name] of interventional cardiology. He underwent an angiogram on 08/06/17 and he underwent a PTA and directional ?atherectomy of the lesser distal SFA and popliteal arteries which resulted in brisk improvement in blood flow. It was noted that he had 2 vessel runoff through ?the anterior tibial and peroneal. He is also been to see vascular and interventional radiologist. He was not felt to have any significant superficial venous ?insufficiency. Presumably is not a candidate for any ablation. It was  suggested he come here for wound care. ?The patient is a type II diabetic on insulin. He also has a history of venous insufficiency. ?ABIs on the left were noncompressible in our clinic ?10/21/17; patient we admitted to the clinic last week. 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 We ?put Iodosorb on him after an aggressive debridement and 3 layer compression. He complained of pain in his ankle and itching with is skin in fact he scratched ?the area on the medial calf superiorly at the rim of our wraps and he has 2 small open areas in that location today which are new. I changed his primary ?dressing today to silver collagen. As noted he is already had revascularization and does not have any significant superficial venous insufficiency that would ?be amenable to ablation ?10/28/17; patient admitted to the clinic 2 weeks ago. He has a smaller Wound. Scratch injury from last week revealed. There is large wound over the tibial area. ?This is smaller. Granulation looks healthy. No need for debridement. ?11/04/17; the wound on the left lateral calf looks better. Improved dimensions. Surface of this looks better. We've been maintaining him and Kerlix Coban ?wraps. He finds this much more comfortable. Silver collagen dressing ?11/11/17; left lateral Wound continues to look healthy be making progress. Using a #5 curet I removed removed nonviable skin from the surface of the wound ?and then necrotic debris from the wound surface. Surface of the wound continues to look healthy. ?He also has an open area on the left great toenail bed. We've been using topical antibiotics. ?11/19/17; left anterior lateral wound continues to look healthy but it's not closed. ?He also had a small wound above this on the left leg ?Initially traumatic wounds in the setting of significant chronic venous insufficiency and stasis dermatitis ?11/25/17; left anterior wounds superiorly is  closed still a small wound inferiorly. ?12/02/17; left anterior tibial area. Arrives today with adherent callus. Post debridement clearly not completely closed. Hydrofera Blue under 3 layer compression. ?12/09/17;

## 2021-10-09 NOTE — Progress Notes (Signed)
BRAXLEY, BALANDRAN ([id number]) ?Visit Report for 10/09/2021 ?Arrival Information Details ?Patient Name: Date of Service: ?[name], [name] 10/09/2021 11:30 A M ?Medical Record Number: [id number] ?Patient Account Number: [id number] ?Date of Birth/Sex: Treating RN: ?05/21/1951 (71 y.o. [name] ?Primary Care Valetta Mulroy: [name] Other Clinician: ?Referring Khadeeja Elden: ?Treating Cecil Bixby/Extender: Fredirick Maudlin ?[name] ?Weeks in Treatment: 25 ?Visit Information History Since Last Visit ?Added or deleted any medications: No ?Patient Arrived: Ambulatory ?Any new allergies or adverse reactions: No ?Arrival Time: 11:42 ?Had a fall or experienced change in No ?Accompanied By: alone ?activities of daily living that may affect ?Transfer Assistance: None ?risk of falls: ?Patient Identification Verified: Yes ?Signs or symptoms of abuse/neglect since last visito No ?Secondary Verification Process Completed: Yes ?Hospitalized since last visit: No ?Patient Requires Transmission-Based Precautions: No ?Implantable device outside of the clinic excluding No ?Patient Has Alerts: Yes ?cellular tissue based products placed in the center ?Patient Alerts: ?ABI's: 09/22 L:1.09 R: 1. since last visit: ?TBI's: R:0.72 L:0.99 ?Has Dressing in Place as Prescribed: Yes ?Has Footwear/Offloading in Place as Prescribed: Yes ?Left: T Contact Cast ?otal ?Pain Present Now: No ?Electronic Signature(s) ?Signed: 10/09/2021 5:45:33 PM By: Levan Hurst RN, BSN ?Entered By: Levan Hurst on 10/09/2021 11:42:32 ?-------------------------------------------------------------------------------- ?Encounter Discharge Information Details ?Patient Name: Date of Service: ?[name], [name] 10/09/2021 11:30 A M ?Medical Record Number: [id number] ?Patient Account Number: [id number] ?Date of Birth/Sex: Treating RN: ?[birth year] (71 y.o. [name] ?Primary Care Jerre Vandrunen: [name] Other Clinician: ?Referring Grettel Rames: ?Treating Zamyah Wiesman/Extender: Fredirick Maudlin ?[name] ?Weeks in Treatment: 25 ?Encounter Discharge Information Items ?Discharge Condition: Stable ?Ambulatory Status: Ambulatory ?Discharge Destination: Home ?Transportation: Private Auto ?Accompanied By: alone ?Schedule Follow-up Appointment: Yes ?Clinical Summary of Care: Patient Declined ?Electronic Signature(s) ?Signed: 10/09/2021 5:45:33 PM By: Levan Hurst RN, BSN ?Entered By: Levan Hurst on 10/09/2021 16:04:24 ?-------------------------------------------------------------------------------- ?Negative Pressure Wound Therapy Maintenance (NPWT) Details ?Patient Name: ?Date of Service: ?[name], [name] 10/09/2021 11:30 A M ?Medical Record Number: [id number] ?Patient Account Number: [id number] ?Date of Birth/Sex: ?Treating RN: ?[birth year] (71 y.o. [name] ?Primary Care Vesna Kable: [name] ?Other Clinician: ?Referring Othell Diluzio: ?Treating Tomi Grandpre/Extender: Fredirick Maudlin ?[name] ?Weeks in Treatment: 25 ?NPWT Maintenance Performed for: Wound #22 Left, Lateral Lower Leg ?Performed By: Levan Hurst, RN ?Type: VAC System ?Coverage Size (sq cm): 11.04 ?Pressure Type: Constant ?Pressure Setting: 125 mmHG ?Drain Type: None ?Sponge/Dressing Type: ?Combination : packing strip, black foam ?Date Initiated: 07/27/2021 ?Dressing Removed: Yes ?Quantity of Sponges/Gauze Removed: 1 piece packing strip, 1 piece black foam removed ?Canister Changed: No ?Dressing Reapplied: Yes ?Quantity of Sponges/Gauze Inserted: 1 piece packing strip, 1 piece black foam inserted ?Respones T Treatment: ?o pt tolerated well ?Days On NPWT : 75 ?Electronic Signature(s) ?Signed: 10/09/2021 5:45:33 PM By: Levan Hurst RN, BSN ?Entered By: Levan Hurst on 10/09/2021 16:01:33 ?-------------------------------------------------------------------------------- ?Wound Assessment Details ?Patient Name: ?Date of Service: ?[name], [name] 10/09/2021 11:30 A M ?Medical Record Number: [id number] ?Patient Account Number:  [id number] ?Date of Birth/Sex: ?Treating RN: ?11-29-1950 (71 y.o. [name] ?Primary Care Ziggy Chanthavong: [name] ?Other Clinician: ?Referring Jazminn Pomales: ?Treating Keyonta Barradas/Extender: Fredirick Maudlin ?[name] ?Weeks in Treatment: 25 ?Wound Status ?Wound Number: 22 Primary Cyst ?Etiology: ?Wound Location: Left, Lateral Lower Leg ?Wound Open ?Wounding Event: Bump ?Status: ?Date Acquired: 06/03/2021 ?Comorbid Glaucoma, Sleep Apnea, Hypertension, Peripheral Arterial Disease, ?Weeks Of Treatment: 18 ?History: Peripheral Venous Disease, Type II Diabetes, Gout, Osteoarthritis, ?Clustered Wound: No Neuropathy ?Wound Measurements ?Length: (cm) 4.8 ?Width: (cm) 2.3 ?Depth: (cm) 1 ?  Area: (cm?) 8.671 ?Volume: (cm?) 8.671 ?% Reduction in Area: -425.8% ?% Reduction in Volume: -557.4% ?Epithelialization: Small (1-33%) ?Tunneling: Yes ?Position (o'clock): 12 ?Maximum Distance: (cm) 12.3 ?Undermining: No ?Wound Description ?Classification: Full Thickness With Exposed Support Structures ?Wound Margin: Well defined, not attached ?Exudate Amount: Large ?Exudate Type: Serosanguineous ?Exudate Color: red, brown ?Foul Odor After Cleansing: No ?Slough/Fibrino No ?Wound Bed ?Granulation Amount: Large (67-100%) Exposed Structure ?Granulation Quality: Red ?Fascia Exposed: No ?Necrotic Amount: None Present (0%) ?Fat Layer (Subcutaneous Tissue) Exposed: Yes ?Tendon Exposed: No ?Muscle Exposed: No ?Joint Exposed: No ?Bone Exposed: No ?Treatment Notes ?Wound #22 (Lower Leg) Wound Laterality: Left, Lateral ?Cleanser ?Wound Cleanser ?Discharge Instruction: Cleanse the wound with wound cleanser prior to applying a clean dressing using gauze sponges, not tissue or cotton ?balls. ?Peri-Wound Care ?Skin Prep ?Discharge Instruction: Use skin prep as directed ?Topical ?Primary Dressing ?Wound vac ?Secondary Dressing ?Secured With ?Compression Wrap ?Compression Stockings ?Add-Ons ?Electronic Signature(s) ?Signed: 10/09/2021 5:45:33 PM By: Levan Hurst RN, BSN ?Entered By: Levan Hurst on 10/09/2021 11:45:15 ?-------------------------------------------------------------------------------- ?Vitals Details ?Patient Name: Date of Service: ?[place], [name] 10/09/2021 11:30 A M ?Medical Record Number: [id number] ?Patient Account Number: [id number] ?Date of Birth/Sex: Treating RN: ?[birth year] (71 y.o. [name] ?Primary Care Maressa Apollo: [name] Other Clinician: ?Referring Mica Ramdass: ?Treating Eleana Tocco/Extender: Fredirick Maudlin ?[name] ?Weeks in Treatment: 25 ?Vital Signs ?Time Taken: 11:42 ?Temperature (??F): 97.8 ?Height (in): 74 ?Pulse (bpm): 69 ?Weight (lbs): 238 ?Respiratory Rate (breaths/min): 18 ?Body Mass Index (BMI): 30.6 ?Blood Pressure (mmHg): 143/81 ?Capillary Blood Glucose (mg/dl): 169 ?Reference Range: 80 - 120 mg / dl ?Notes ?glucose per pt report ?Electronic Signature(s) ?Signed: 10/09/2021 5:45:33 PM By: Levan Hurst RN, BSN ?Entered By: Levan Hurst on 10/09/2021 11:46:44 ?

## 2021-10-09 NOTE — Progress Notes (Signed)
PLEZ, BELTON ([id number]) ?Visit Report for 10/09/2021 ?SuperBill Details ?Patient Name: Date of Service: ?[name], [name] 10/09/2021 ?Medical Record Number: [id number] ?Patient Account Number: [id number] ?Date of Birth/Sex: Treating RN: ?[birth year] (71 y.o. [name] ?Primary Care Provider: [name] Other Clinician: ?Referring Provider: ?Treating Provider/Extender: Fredirick Maudlin ?[name] ?Weeks in Treatment: 25 ?Diagnosis Coding ?ICD-10 Codes ?Code Description ?E11.621 Type 2 diabetes mellitus with foot ulcer ?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 ?I89.0 Lymphedema, not elsewhere classified ?W10.272 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 ?Z36.644 Non-pressure chronic ulcer of other part of left lower leg with other specified severity ?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 of other part of left foot with other specified severity ?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 ?Facility Procedures ?CPT4 Code Description Modifier Quantity ?[id number] - WOUND VAC-50 SQ CM OR LESS 1 ?Electronic Signature(s) ?Signed: 10/09/2021 4:15:42 PM By: Fredirick Maudlin MD FACS ?Signed: 10/09/2021 5:45:33 PM By: Levan Hurst RN, BSN ?Entered By: Levan Hurst on 10/09/2021 16:04:35 ?

## 2021-10-09 NOTE — Progress Notes (Signed)
KENDYN, ZAMAN ([id number]) ?Visit Report for 10/05/2021 ?Arrival Information Details ?Patient Name: Date of Service: ?[name], [name] 10/05/2021 10:00 A M ?Medical Record Number: [id number] ?Patient Account Number: [id number] ?Date of Birth/Sex: Treating RN: ?1950-08-10 (71 y.o. [name] ?Primary Care Janis Sol: [name] Other Clinician: ?Referring Rolland Steinert: ?Treating Daysean Tinkham/Extender: Fredirick Maudlin ?[name] ?Weeks in Treatment: 25 ?Visit Information History Since Last Visit ?Added or deleted any medications: No ?Patient Arrived: Ambulatory ?Any new allergies or adverse reactions: No ?Arrival Time: 10:24 ?Had a fall or experienced change in No ?Accompanied By: Self ?activities of daily living that may affect ?Transfer Assistance: None ?risk of falls: ?Patient Identification Verified: Yes ?Signs or symptoms of abuse/neglect since last visito No ?Secondary Verification Process Completed: Yes ?Hospitalized since last visit: No ?Patient Requires Transmission-Based Precautions: No ?Implantable device outside of the clinic excluding No ?Patient Has Alerts: Yes ?cellular tissue based products placed in the center ?Patient Alerts: ?ABI's: 09/22 L:1.09 R: 1. since last visit: ?TBI's: R:0.72 L:0.99 ?Has Dressing in Place as Prescribed: Yes ?Has Footwear/Offloading in Place as Prescribed: Yes ?Left: T Contact Cast ?otal ?Pain Present Now: Yes ?Electronic Signature(s) ?Signed: 10/05/2021 5:23:26 PM By: Sharyn Creamer RN, BSN ?Entered By: Sharyn Creamer on 10/05/2021 10:25:21 ?-------------------------------------------------------------------------------- ?Encounter Discharge Information Details ?Patient Name: Date of Service: ?[name], [name] 10/05/2021 10:00 A M ?Medical Record Number: [id number] ?Patient Account Number: [id number] ?Date of Birth/Sex: Treating RN: ?02/25/1951 (71 y.o. [name] ?Primary Care Roye Gustafson: [name] Other Clinician: ?Referring Maricus Tanzi: ?Treating Stormi Vandevelde/Extender: Fredirick Maudlin ?[name] ?Weeks in Treatment: 25 ?Encounter Discharge Information Items Post Procedure Vitals ?Discharge Condition: Stable ?Temperature (F): 98.3 ?Ambulatory Status: Ambulatory ?Pulse (bpm): 74 ?Discharge Destination: Home ?Respiratory Rate (breaths/min): 18 ?Transportation: Private Auto ?Blood Pressure (mmHg): 134/77 ?Accompanied By: self ?Schedule Follow-up Appointment: Yes ?Clinical Summary of Care: Patient Declined ?Electronic Signature(s) ?Signed: 10/05/2021 5:23:26 PM By: Sharyn Creamer RN, BSN ?Entered By: Sharyn Creamer on 10/05/2021 12:23:23 ?-------------------------------------------------------------------------------- ?Lower Extremity Assessment Details ?Patient Name: ?Date of Service: ?[name], [name] 10/05/2021 10:00 A M ?Medical Record Number: [id number] ?Patient Account Number: [id number] ?Date of Birth/Sex: ?Treating RN: ?1950/08/29 (71 y.o. [name] ?Primary Care Leiloni Smithers: [name] ?Other Clinician: ?Referring Davena Julian: ?Treating Zimri Brennen/Extender: Fredirick Maudlin ?[name] ?Weeks in Treatment: 25 ?Edema Assessment ?Assessed: [Left: No] [Right: No] ?Edema: [Left: Ye] [Right: s] ?Calf ?Left: Right: ?Point of Measurement: 41 cm From Medial Instep 43.1 cm ?Ankle ?Left: Right: ?Point of Measurement: 10 cm From Medial Instep 29.1 cm ?Vascular Assessment ?Pulses: ?Dorsalis Pedis ?Palpable: [Left:Yes] ?Electronic Signature(s) ?Signed: 10/05/2021 5:23:26 PM By: Sharyn Creamer RN, BSN ?Entered By: Sharyn Creamer on 10/05/2021 10:43:48 ?-------------------------------------------------------------------------------- ?Multi Wound Chart Details ?Patient Name: ?Date of Service: ?Hard, [name] 10/05/2021 10:00 A M ?Medical Record Number: [id number] ?Patient Account Number: [id number] ?Date of Birth/Sex: ?Treating RN: ?11/20/1950 (71 y.o. [name] ?Primary Care Kynslei Art: [name] ?Other Clinician: ?Referring Faysal Fenoglio: ?Treating Jarone Ostergaard/Extender: Fredirick Maudlin ?[name] ?Weeks in Treatment: 25 ?Vital Signs ?Height(in): 74 ?Pulse(bpm): 74 ?Weight(lbs): 238 ?Blood Pressure(mmHg): 134/77 ?Body Mass Index(BMI): 30.6 ?Temperature(??F): 98.3 ?Respiratory Rate(breaths/min): 20 ?Photos: [18:Left, Plantar Metatarsal head first] [22:Left, Lateral Lower Leg] [24:Left, Medial Upper Leg] ?Wound Location: [18:Gradually Appeared] [22:Bump] [24:Trauma] ?Wounding Event: [18:Diabetic Wound/Ulcer of the Lower] [22:Cyst] [24:Abrasion] ?Primary Etiology: [18:Extremity Glaucoma, Sleep Apnea,] [22:Glaucoma, Sleep Apnea,] [24:Glaucoma, Sleep Apnea,] ?Comorbid History: [18:Hypertension, Peripheral ArterialHypertension, Peripheral Arterial Disease, Peripheral Venous Disease, Type II Diabetes, Gout, Osteoarthritis, Neuropathy 08/23/2020] [22:Neuropathy 06/03/2021] [24:Hypertension, Peripheral Arterial  ?Disease, Peripheral Venous Disease, Type  II Diabetes, Gout, Osteoarthritis, Neuropathy 09/14/2021] ?Date Acquired: [18:25] [22:17] [24:2] ?Weeks of Treatment: [18:Open] [22:Open] [24:Open] ?Wound Status: [18:No] [22:No] [24:No] ?Wound Recurrence: [18:2x2.8x0.2] [22:4.8x2.3x1] [24:1.1x0.6x0.1] ?Measurements L x W x D (cm) [18:4.398] [22:8.671] [24:0.518] ?A (cm?) : ?rea [18:0.88] [12:8.786] [24:0.052] ?Volume (cm?) : [18:68.90%] [22:-425.80%] [24:68.60%] ?% Reduction in A [18:rea: 68.90%] [22:-557.40%] [24:68.50%] ?% Reduction in Volume: [22:12] ?Position 1 (o'clock): [22:12.3] ?Maximum Distance 1 (cm): [18:No] [22:Yes] [24:No] ?Tunneling: [18:Grade 2] [22:Full Thickness With Exposed Support Full Thickness Without Exposed] ?Classification: [18:Medium] [22:Structures Large] [24:Support Structures Medium] ?Exudate A mount: [18:Serosanguineous] [22:Serosanguineous] [24:Serosanguineous] ?Exudate Type: [18:red, brown] [22:red, brown] [24:red, brown] ?Exudate Color: [18:Thickened] [22:Well defined, not attached] [24:Flat and Intact] ?Wound Margin: [18:Large (67-100%)] [22:Large (67-100%)] [24:Large  (67-100%)] ?Granulation A mount: [18:Red] [22:Red] [24:Red, Pink] ?Granulation Quality: [18:None Present (0%)] [22:None Present (0%)] [24:None Present (0%)] ?Necrotic A mount: ?[18:Fat Layer (Subcutaneous Tissue): Yes Fat Layer (Subcutaneous Tissue): Yes Fat Layer (Subcutaneous Tissue): Yes] ?Exposed Structures: ?[18:Fascia: No Tendon: No Muscle: No Joint: No Bone: No Small (1-33%)] [22:Fascia: No Tendon: No Muscle: No Joint: No Bone: No Small (1-33%)] [24:Fascia: No Tendon: No Muscle: No Joint: No Bone: No Small (1-33%)] ?Epithelialization: [18:Debridement - Selective/Open Wound N/A] [24:Debridement - Selective/Open Wound] ?Debridement: ?Pre-procedure Verification/Time Out 11:09 [22:N/A] [76:72:09] ?Taken: [18:Other] [22:N/A] [24:Other] ?Pain Control: [18:Callus, Slough] [22:N/A] [24:Necrotic/Eschar] ?Tissue Debrided: [18:Non-Viable Tissue] [22:N/A] [24:Non-Viable Tissue] ?Level: [18:5.6] [22:N/A] [24:0.3] ?Debridement A (sq cm): [18:rea Curette] [22:N/A] [24:Curette] ?Instrument: [18:Minimum] [22:N/A] [24:Minimum] ?Bleeding: [18:Pressure] [22:N/A] [24:Pressure] ?Hemostasis A chieved: [18:0] [22:N/A] [24:0] ?Procedural Pain: [18:0] [22:N/A] [24:0] ?Post Procedural Pain: [18:Procedure was tolerated well] [22:N/A] [24:Procedure was tolerated well] ?Debridement Treatment Response: [18:2x2.8x0.2] [22:N/A] [24:1.1x0.6x0.1] ?Post Debridement Measurements L x ?W x D (cm) [18:0.88] [22:N/A] [24:0.052] ?Post Debridement Volume: (cm?) [18:Debridement] [22:Negative Pressure Wound Therapy] [24:Debridement] ?Procedures Performed: [18:T Contact Cast otal] [22:Maintenance (NPWT)] ?Treatment Notes ?Wound #18 (Metatarsal head first) Wound Laterality: Plantar, Left ?Cleanser ?Soap and Water ?Discharge Instruction: May shower and wash wound with dial antibacterial soap and water prior to dressing change. ?Wound Cleanser ?Discharge Instruction: Cleanse the wound with wound cleanser prior to applying a clean dressing using gauze  sponges, not tissue or cotton balls. ?Peri-Wound Care ?Triamcinolone 15 (g) ?Discharge Instruction: Use triamcinolone 15 (g) as directed ?Sween Lotion (Moisturizing lotion) ?Discharge Instruction: Apply moisturizing lotio

## 2021-10-12 ENCOUNTER — Encounter (HOSPITAL_BASED_OUTPATIENT_CLINIC_OR_DEPARTMENT_OTHER): Payer: Medicare Other | Admitting: General Surgery

## 2021-10-12 DIAGNOSIS — E11621 Type 2 diabetes mellitus with foot ulcer: Secondary | ICD-10-CM | POA: Diagnosis not present

## 2021-10-13 NOTE — Progress Notes (Signed)
TAMI, BARREN ([id number]) ?Visit Report for 10/12/2021 ?Arrival Information Details ?Patient Name: Date of Service: ?[place], [name] 10/12/2021 9:15 A M ?Medical Record Number: [id number] ?Patient Account Number: [id number] ?Date of Birth/Sex: Treating RN: ?[birth year] (71 y.o. [name] ?Primary Care Karem Tomaso: [name] Other Clinician: ?Referring Shandi Godfrey: ?Treating Leilanie Rauda/Extender: Fredirick Maudlin ?[name] ?Weeks in Treatment: 26 ?Visit Information History Since Last Visit ?Added or deleted any medications: No ?Patient Arrived: Ambulatory ?Any new allergies or adverse reactions: No ?Arrival Time: 09:14 ?Had a fall or experienced change in No ?Accompanied By: self ?activities of daily living that may affect ?Transfer Assistance: None ?risk of falls: ?Patient Identification Verified: Yes ?Signs or symptoms of abuse/neglect since last visito No ?Secondary Verification Process Completed: Yes ?Hospitalized since last visit: No ?Patient Requires Transmission-Based Precautions: No ?Implantable device outside of the clinic excluding No ?Patient Has Alerts: Yes ?cellular tissue based products placed in the center ?Patient Alerts: ?ABI's: 09/22 L:1.09 R: 1. since last visit: ?TBI's: R:0.72 L:0.99 ?Has Dressing in Place as Prescribed: Yes ?Has Footwear/Offloading in Place as Prescribed: Yes ?Left: T Contact Cast ?otal ?Pain Present Now: No ?Electronic Signature(s) ?Signed: 10/12/2021 5:13:15 PM By: Baruch Gouty RN, BSN ?Entered By: Baruch Gouty on 10/12/2021 09:15:03 ?-------------------------------------------------------------------------------- ?Encounter Discharge Information Details ?Patient Name: Date of Service: ?[place], [name] 10/12/2021 9:15 A M ?Medical Record Number: [id number] ?Patient Account Number: [id number] ?Date of Birth/Sex: Treating RN: ?20-Feb-1951 (71 y.o. [name] ?Primary Care Tiah Heckel: [name] Other Clinician: ?Referring Lexxi Koslow: ?Treating Ami Mally/Extender: Fredirick Maudlin ?[name] ?Weeks in Treatment: 26 ?Encounter Discharge Information Items ?Discharge Condition: Stable ?Ambulatory Status: Ambulatory ?Discharge Destination: Home ?Transportation: Private Auto ?Accompanied By: alone ?Schedule Follow-up Appointment: Yes ?Clinical Summary of Care: Patient Declined ?Electronic Signature(s) ?Signed: 10/13/2021 6:10:38 PM By: Levan Hurst RN, BSN ?Entered By: Levan Hurst on 10/12/2021 12:02:36 ?-------------------------------------------------------------------------------- ?Lower Extremity Assessment Details ?Patient Name: ?Date of Service: ?[place], [name] 10/12/2021 9:15 A M ?Medical Record Number: [id number] ?Patient Account Number: [id number] ?Date of Birth/Sex: ?Treating RN: ?[birth year] (71 y.o. [name] ?Primary Care Terrin Imparato: [name] ?Other Clinician: ?Referring Haidy Kackley: ?Treating Ayoub Arey/Extender: Fredirick Maudlin ?[name] ?Weeks in Treatment: 26 ?Edema Assessment ?Assessed: [Left: No] [Right: No] ?Edema: [Left: Ye] [Right: s] ?Calf ?Left: Right: ?Point of Measurement: 41 cm From Medial Instep 46.8 cm ?Ankle ?Left: Right: ?Point of Measurement: 10 cm From Medial Instep 28.6 cm ?Vascular Assessment ?Pulses: ?Dorsalis Pedis ?Palpable: [Left:No] ?Electronic Signature(s) ?Signed: 10/12/2021 5:13:15 PM By: Baruch Gouty RN, BSN ?Entered By: Baruch Gouty on 10/12/2021 09:31:11 ?-------------------------------------------------------------------------------- ?Multi Wound Chart Details ?Patient Name: ?Date of Service: ?[place], [name] 10/12/2021 9:15 A M ?Medical Record Number: [id number] ?Patient Account Number: [id number] ?Date of Birth/Sex: ?Treating RN: ?1951-03-09 (71 y.o. [name] ?Primary Care Kennice Finnie: [name] ?Other Clinician: ?Referring Jamarie Joplin: ?Treating Travius Crochet/Extender: Fredirick Maudlin ?[name] ?Weeks in Treatment: 26 ?Vital Signs ?Height(in): 74 ?Capillary Blood Glucose(mg/dl): 202 ?Weight(lbs): 238 ?Pulse(bpm):  65 ?Body Mass Index(BMI): 30.6 ?Blood Pressure(mmHg): 150/80 ?Temperature(??F): 98.4 ?Respiratory Rate(breaths/min): 18 ?Photos: [18:Left, Plantar Metatarsal head first] [22:Left, Lateral Lower Leg] [24:Left, Medial Upper Leg] ?Wound Location: [18:Gradually Appeared] [22:Bump] [24:Trauma] ?Wounding Event: [18:Diabetic Wound/Ulcer of the Lower] [22:Cyst] [24:Abrasion] ?Primary Etiology: [18:Extremity Glaucoma, Sleep Apnea,] [22:Glaucoma, Sleep Apnea,] [24:Glaucoma, Sleep Apnea,] ?Comorbid History: [18:Hypertension, Peripheral Arterial Disease, Peripheral Venous Disease, Disease, Peripheral Venous Disease, Disease, Peripheral Venous Disease, Type II Diabetes, Gout, Osteoarthritis, Neuropathy 08/23/2020] [22:Hypertension, Peripheral  ?Arterial Neuropathy 06/03/2021] [24:Hypertension, Peripheral Arterial Type II Diabetes, Gout, Osteoarthritis, Neuropathy 09/14/2021] ?Date Acquired: [  18:26] [22:18] [24:3] ?Weeks of Treatment: [18:Open] [22:Open] [24:Open] ?Wound Status: [18:No] [22:No] [24:No] ?Wound Recurrence: [18:1.9x3x0.1] [22:4.4x2x1.3] [24:1.2x0.7x0.1] ?Measurements L x W x D (cm) [18:4.477] [56:3.893] [24:0.66] ?A (cm?) : ?rea [18:0.448] [73:4.287] [24:0.066] ?Volume (cm?) : [18:68.30%] [22:-319.20%] [24:60.00%] ?% Reduction in A rea: [18:84.20%] [22:-581.20%] [24:60.00%] ?% Reduction in Volume: [22:12] ?Position 1 (o'clock): [22:16] ?Maximum Distance 1 (cm): [18:No] [22:Yes] [24:No] ?Tunneling: [18:Grade 2] [22:Full Thickness With Exposed Support Full Thickness Without Exposed] ?Classification: [18:Medium] [22:Structures Large] [24:Support Structures Medium] ?Exudate Amount: [18:Serosanguineous] [22:Serosanguineous] [24:Serosanguineous] ?Exudate Type: [18:red, brown] [22:red, brown] [24:red, brown] ?Exudate Color: [18:Flat and Intact] [22:Well defined, not attached] [24:Flat and Intact] ?Wound Margin: [18:Large (67-100%)] [22:Large (67-100%)] [24:Large (67-100%)] ?Granulation Amount: [18:Red] [22:Red] [24:Red,  Pink] ?Granulation Quality: [18:None Present (0%)] [22:None Present (0%)] [24:None Present (0%)] ?Necrotic Amount: ?[18:Fat Layer (Subcutaneous Tissue): Yes Fat Layer (Subcutaneous Tissue): Yes Fat Layer (Subcutaneous Tissue): Yes] ?Exposed Structures: ?[18:Fascia: No Tendon: No Muscle: No Joint: No Bone: No Small (1-33%)] [22:Fascia: No Tendon: No Muscle: No Joint: No Bone: No Small (1-33%)] [24:Fascia: No Tendon: No Muscle: No Joint: No Bone: No Medium (34-66%)] ?Epithelialization: [18:T Contact Cast otal] [22:Negative Pressure Wound Therapy] [24:N/A] ?Procedures Performed: [22:Maintenance (NPWT)] ?Treatment Notes ?Electronic Signature(s) ?Signed: 10/12/2021 10:11:23 AM By: Fredirick Maudlin MD FACS ?Signed: 10/13/2021 6:10:38 PM By: Levan Hurst RN, BSN ?Entered By: Fredirick Maudlin on 10/12/2021 10:11:22 ?-------------------------------------------------------------------------------- ?Multi-Disciplinary Care Plan Details ?Patient Name: ?Date of Service: ?[place], [name] 10/12/2021 9:15 A M ?Medical Record Number: [id number] ?Patient Account Number: [id number] ?Date of Birth/Sex: ?Treating RN: ?07-02-1950 (71 y.o. [name] ?Primary Care Jameek Bruntz: [name] ?Other Clinician: ?Referring Alejandro Gamel: ?Treating Uchechukwu Dhawan/Extender: Fredirick Maudlin ?[name] ?Weeks in Treatment: 26 ?Multidisciplinary Care Plan reviewed with physician ?Active Inactive ?Venous Leg Ulcer ?Nursing Diagnoses: ?Knowledge deficit related to disease process and management ?Potential for venous Insuffiency (use before diagnosis confirmed) ?Goals: ?Patient will maintain optimal edema control ?Date Initiated: 07/27/2021 ?Target Resolution Date: 10/20/2021 ?Goal Status: Active ?Interventions: ?Assess peripheral edema status every visit. ?Treatment Activities: ?Therapeutic compression applied : 07/27/2021 ?Notes: ?Wound/Skin Impairment ?Nursing Diagnoses: ?Impaired tissue integrity ?Knowledge deficit related to ulceration/compromised skin  integrity ?Goals: ?Patient will have a decrease in wound volume by X% from date: (specify in notes) ?Date Initiated: 04/12/2021 ?Date Inactivated: 04/27/2021 ?Target Resolution Date: 04/23/2021 ?Goal Status: Met

## 2021-10-13 NOTE — Progress Notes (Signed)
MCIHAEL, HINDERMAN ([id number]) ?Visit Report for 10/12/2021 ?Chief Complaint Document Details ?Patient Name: Date of Service: ?[name], [name] 10/12/2021 9:15 A M ?Medical Record Number: [id number] ?Patient Account Number: [id number] ?Date of Birth/Sex: Treating RN: ?25-Nov-1950 (71 y.o. [name] ?Primary Care Provider: Jilda [name] Other Clinician: ?Referring Provider: ?Treating Provider/Extender: Fredirick Maudlin ?[name] ?Weeks in Treatment: 26 ?Information Obtained from: Patient ?Chief Complaint ?Left leg and foot ulcers ?04/12/2021; patient is here for wounds on his left lower leg and left plantar foot over the first metatarsal head ?Electronic Signature(s) ?Signed: 10/12/2021 10:11:40 AM By: Fredirick Maudlin MD FACS ?Entered By: Fredirick Maudlin on 10/12/2021 10:11:40 ?-------------------------------------------------------------------------------- ?HPI Details ?Patient Name: Date of Service: ?Adel, [name] 10/12/2021 9:15 A M ?Medical Record Number: [id number] ?Patient Account Number: [id number] ?Date of Birth/Sex: Treating RN: ?[birth year] (71 y.o. [name] ?Primary Care Provider: Jilda [name] Other Clinician: ?Referring Provider: ?Treating Provider/Extender: Fredirick Maudlin ?[name] ?Weeks in Treatment: 26 ?History of Present Illness ?HPI Description: 10/11/17; Mr. Jachim is a 71 year old man who tells me that in 2015 he slipped down the latter traumatizing his left leg. He developed a wound ?in the same spot the area that we are currently looking at. He states this closed over for the most part although he always felt it was somewhat unstable. In ?2016 he hit the same area with the door of his car had this reopened. He tells me that this is never really closed although sometimes an inflow it remains open ?on a constant basis. He has not been using any specific dressing to this except for topical antibiotics the nature of which were not really sure. ?His primary doctor did send him to see  Dr. [name] of interventional cardiology. He underwent an angiogram on 08/06/17 and he underwent a PTA and directional ?atherectomy of the lesser distal SFA and popliteal arteries which resulted in brisk improvement in blood flow. It was noted that he had 2 vessel runoff through ?the anterior tibial and peroneal. He is also been to see vascular and interventional radiologist. He was not felt to have any significant superficial venous ?insufficiency. Presumably is not a candidate for any ablation. It was suggested he come here for wound care. ?The patient is a type II diabetic on insulin. He also has a history of venous insufficiency. ?ABIs on the left were noncompressible in our clinic ?10/21/17; patient we admitted to the clinic last week. 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 We ?put Iodosorb on him after an aggressive debridement and 3 layer compression. He complained of pain in his ankle and itching with is skin in fact he scratched ?the area on the medial calf superiorly at the rim of our wraps and he has 2 small open areas in that location today which are new. I changed his primary ?dressing today to silver collagen. As noted he is already had revascularization and does not have any significant superficial venous insufficiency that would ?be amenable to ablation ?10/28/17; patient admitted to the clinic 2 weeks ago. He has a smaller Wound. Scratch injury from last week revealed. There is large wound over the tibial area. ?This is smaller. Granulation looks healthy. No need for debridement. ?11/04/17; the wound on the left lateral calf looks better. Improved dimensions. Surface of this looks better. We've been maintaining him and Kerlix Coban ?wraps. He finds this much more comfortable. Silver collagen dressing ?11/11/17; left lateral Wound continues  to look healthy be making progress. Using a #5 curet I removed removed nonviable skin from the  surface of the wound ?and then necrotic debris from the wound surface. Surface of the wound continues to look healthy. ?He also has an open area on the left great toenail bed. We've been using topical antibiotics. ?11/19/17; left anterior lateral wound continues to look healthy but it's not closed. ?He also had a small wound above this on the left leg ?Initially traumatic wounds in the setting of significant chronic venous insufficiency and stasis dermatitis ?11/25/17; left anterior wounds superiorly is closed still a small wound inferiorly. ?12/02/17; left anterior tibial area. Arrives today with adherent callus. Post debridement clearly not completely closed. Hydrofera Blue under 3 layer compression. ?12/09/17; left anterior tibia. Circumferential eschar however the wound bed looks stable to improved. We've been using Hydrofera Blue under 3 layer ?compression ?12/17/17; left anterior tibia. Apparently this was felt to be closed however when the wrap was taken off there is a skin tear to reopen wounds in the same area ?we've been using Hydrofera Blue under 3 layer compression ?12/23/17 left anterior tibia. Not close to close this week apparently the [facility] was stuck to this again. Still circumferential eschar requiring debridement. I ?put a contact layer on this this time under the Hydrofera Blue ?12/31/17; left anterior tibia. Wound is better slight amount of hyper-granulation. Using Hydrofera Blue over Adaptic. ?01/07/18; left anterior tibia. The wound had some surface eschar however after this was removed he has no open wound.he was already revascularized by Dr. ?Ganji when he came to our clinic with atherectomy of the left SFA and popliteal artery. He was also sent to interventional radiology for venous reflux studies. ?He was not felt to have significant reflux but certainly has chronic venous changes of his skin with hemosiderin deposition around this area. He will definitely ?need to lubricate his skin and wear  compression stocking and I've talked to him about this. ?READMISSION ?05/26/2018 ?This is a now 71 year old man we cared for with traumatic wounds on his left anterior lower extremity. He had been previously revascularized during that ?admission by Dr. [name]. Apparently in follow-up Dr. [name] noted that he had deterioration in his arterial status. He underwent a stent placement in the distal left ?SFA on 04/22/2018. 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 He went back to the angiography suite on 04/30/2018 he underwent PTA and ?balloon angioplasty of the occluded left mid anterior tibial artery, thrombotic occlusion went from 100 to 0% which reconstitutes the posterior tibial artery. He ?had thrombectomy and aspiration of the peroneal artery. The stent placed in the distal SFA left SFA was still occluded. 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 ?He has been recuperating at [place] home in [facility] after the most recent hospitalization. He is going home tomorrow. He tells me that 2 weeks ?ago he traumatized the tip of his left fifth toe. He came in urgently for our review of this. This was a history of before I noted that Dr. [name] had already noted ?dry gangrenous changes of the left fifth toe ?06/09/2018; 2-week follow-up. I did contact Dr. [name] after his last appointment and he apparently saw 1 of Dr. Irven Shelling colleagues  the next day. He does not ?follow-up with Dr. [name] himself until Thursday of this week. 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 Nevertheless there is no evidence of ?infection no drainage and no pain. He had a new area that this week when we were signing him in today on the left anterior mid tibia  area, this is in close ?proximity to the previous wound we have dealt with in this clinic. ?06/23/2018; 2-week follow-up. I did not receive a recent note from Dr. [name] to review today. Our office is tryin

## 2021-10-16 ENCOUNTER — Encounter (HOSPITAL_BASED_OUTPATIENT_CLINIC_OR_DEPARTMENT_OTHER): Payer: Medicare Other | Admitting: General Surgery

## 2021-10-16 DIAGNOSIS — E11621 Type 2 diabetes mellitus with foot ulcer: Secondary | ICD-10-CM | POA: Diagnosis not present

## 2021-10-16 NOTE — Progress Notes (Signed)
BERWYN, BIGLEY ([id number]) ?Visit Report for 10/16/2021 ?SuperBill Details ?Patient Name: Date of Service: ?[name], [name]. 10/16/2021 ?Medical Record Number: [id number] ?Patient Account Number: [id number] ?Date of Birth/Sex: Treating RN: ?[birth year] (71 y.o. [name] ?Primary Care Provider: [name] Other Clinician: ?Referring Provider: ?Treating Provider/Extender: Fredirick Maudlin ?[name] ?Weeks in Treatment: 26 ?Diagnosis Coding ?ICD-10 Codes ?Code Description ?E11.621 Type 2 diabetes mellitus with foot ulcer ?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 ?I89.0 Lymphedema, not elsewhere classified ?N62.952 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 ?W41.324 Non-pressure chronic ulcer of other part of left lower leg with other specified severity ?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 of other part of left foot with other specified severity ?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 ?Facility Procedures ?CPT4 Code Description Modifier Quantity ?[id number] (Facility Use Only) [phone] - APPLY MULTLAY COMPRS LWR LT LEG 1 ?Electronic Signature(s) ?Signed: 10/16/2021 3:54:45 PM By: Fredirick Maudlin MD FACS ?Signed: 10/16/2021 5:29:07 PM By: Levan Hurst RN, BSN ?Entered By: Levan Hurst on 10/16/2021 12:37:59 ?

## 2021-10-18 ENCOUNTER — Ambulatory Visit: Payer: BC Managed Care – PPO | Admitting: Vascular Surgery

## 2021-10-18 ENCOUNTER — Ambulatory Visit (INDEPENDENT_AMBULATORY_CARE_PROVIDER_SITE_OTHER): Payer: Medicare Other | Admitting: Vascular Surgery

## 2021-10-18 ENCOUNTER — Encounter (HOSPITAL_COMMUNITY): Payer: BC Managed Care – PPO

## 2021-10-18 ENCOUNTER — Encounter: Payer: Self-pay | Admitting: Vascular Surgery

## 2021-10-18 VITALS — BP 137/78 | HR 58 | Temp 98.1°F | Resp 20 | Ht 73.5 in | Wt 267.0 lb

## 2021-10-18 DIAGNOSIS — I779 Disorder of arteries and arterioles, unspecified: Secondary | ICD-10-CM

## 2021-10-18 DIAGNOSIS — S81802D Unspecified open wound, left lower leg, subsequent encounter: Secondary | ICD-10-CM

## 2021-10-18 DIAGNOSIS — R600 Localized edema: Secondary | ICD-10-CM

## 2021-10-18 NOTE — Progress Notes (Signed)
RICK, CARRUTHERS ([id number]) ?Visit Report for 10/16/2021 ?Arrival Information Details ?Patient Name: Date of Service: ?[name] [name] RRY W. 10/16/2021 11:30 A M ?Medical Record Number: [id number] ?Patient Account Number: [id number] ?Date of Birth/Sex: Treating RN: ?1951-01-15 (71 y.o. [name] ?Primary Care Leesa Leifheit: [name] Other Clinician: ?Referring Berdie Malter: ?Treating Leona Alen/Extender: Fredirick Maudlin ?[name] ?Weeks in Treatment: 26 ?Visit Information History Since Last Visit ?Added or deleted any medications: No ?Patient Arrived: Ambulatory ?Any new allergies or adverse reactions: No ?Arrival Time: 11:43 ?Had a fall or experienced change in No ?Accompanied By: self ?activities of daily living that may affect ?Transfer Assistance: None ?risk of falls: ?Patient Identification Verified: Yes ?Signs or symptoms of abuse/neglect since last visito No ?Secondary Verification Process Completed: Yes ?Hospitalized since last visit: No ?Patient Requires Transmission-Based Precautions: No ?Implantable device outside of the clinic excluding No ?Patient Has Alerts: Yes ?cellular tissue based products placed in the center ?Patient Alerts: ?ABI's: 09/22 L:1.09 R: 1. since last visit: ?TBI's: R:0.72 L:0.99 ?Has Dressing in Place as Prescribed: Yes ?Pain Present Now: No ?Electronic Signature(s) ?Signed: 10/16/2021 2:08:13 PM By: Sandre Kitty ?Entered By: Sandre Kitty on 10/16/2021 11:43:57 ?-------------------------------------------------------------------------------- ?Complex / Palliative Patient Assessment Details ?Patient Name: Date of Service: ?[name] [name] RRY W. 10/16/2021 11:30 A M ?Medical Record Number: [id number] ?Patient Account Number: [id number] ?Date of Birth/Sex: Treating RN: ?[birth year] (71 y.o. [name] ?Primary Care Karthik Whittinghill: [name] Other Clinician: ?Referring Arie Powell: ?Treating Darrious Youman/Extender: Fredirick Maudlin ?[name] ?Weeks in Treatment: 26 ?Complex Wound Management  Criteria ?Patient has remarkable or complex co-morbidities requiring medications or treatments that extend wound healing times. Examples: ?Diabetes mellitus with chronic renal failure or end stage renal disease requiring dialysis ?Advanced or poorly controlled rheumatoid arthritis ?Diabetes mellitus and end stage chronic obstructive pulmonary disease ?Active cancer with current chemo- or radiation therapy ?diabetes,venous insufficiency, PAD, complicated cyst ?Palliative Wound Management Criteria ?Care Approach ?Wound Care Plan: Complex Wound Management ?Electronic Signature(s) ?Signed: 10/17/2021 11:51:50 AM By: Fredirick Maudlin MD FACS ?Signed: 10/18/2021 4:48:49 PM By: Baruch Gouty RN, BSN ?Entered By: Baruch Gouty on 10/16/2021 16:51:58 ?-------------------------------------------------------------------------------- ?Compression Therapy Details ?Patient Name: ?Date of Service: ?[name] [name] RRY W. 10/16/2021 11:30 A M ?Medical Record Number: [id number] ?Patient Account Number: [id number] ?Date of Birth/Sex: ?Treating RN: ?04-12-1951 (71 y.o. [name] ?Primary Care Randolf Sansoucie: [name] ?Other Clinician: ?Referring Markie Heffernan: ?Treating Lashika Erker/Extender: Fredirick Maudlin ?[name] ?Weeks in Treatment: 26 ?Compression Therapy Performed for Wound Assessment: Wound #22 Left,Lateral Lower Leg ?Performed By: Clinician Levan Hurst, RN ?Compression Type: Double Layer ?Electronic Signature(s) ?Signed: 10/16/2021 5:29:07 PM By: Levan Hurst RN, BSN ?Entered By: Levan Hurst on 10/16/2021 12:35:55 ?-------------------------------------------------------------------------------- ?Encounter Discharge Information Details ?Patient Name: ?Date of Service: ?[name] [name] RRY W. 10/16/2021 11:30 A M ?Medical Record Number: [id number] ?Patient Account Number: [id number] ?Date of Birth/Sex: ?Treating RN: ?1950/07/01 (71 y.o. [name] ?Primary Care Kye Silverstein: [name] ?Other Clinician: ?Referring  Jarryn Altland: ?Treating Rai Severns/Extender: Fredirick Maudlin ?[name] ?Weeks in Treatment: 26 ?Encounter Discharge Information Items ?Discharge Condition: Stable ?Ambulatory Status: Ambulatory ?Discharge Destination: Home ?Transportation: Private Auto ?Accompanied By: alone ?Schedule Follow-up Appointment: Yes ?Clinical Summary of Care: Patient Declined ?Electronic Signature(s) ?Signed: 10/16/2021 5:29:07 PM By: Levan Hurst RN, BSN ?Entered By: Levan Hurst on 10/16/2021 12:37:50 ?-------------------------------------------------------------------------------- ?Wound Assessment Details ?Patient Name: ?Date of Service: ?[name] [name] RRY W. 10/16/2021 11:30 A M ?Medical Record Number: [id number] ?Patient Account Number: [id number] ?Date of Birth/Sex: ?Treating RN: ?[birth year] (71 y.o. [name] ?Primary Care Azya Barbero: [name] ?Other Clinician: ?Referring  Avett Reineck: ?Treating Broadus Costilla/Extender: Fredirick Maudlin ?[name] ?Weeks in Treatment: 26 ?Wound Status ?Wound Number: 18 ?Primary Etiology: Diabetic Wound/Ulcer of the Lower Extremity ?Wound Location: Left, Plantar Metatarsal head first ?Wound Status: Open ?Wounding Event: Gradually Appeared ?Date Acquired: 08/23/2020 ?Weeks Of Treatment: 26 ?Clustered Wound: No ?Wound Measurements ?Length: (cm) 1.9 ?Width: (cm) 3 ?Depth: (cm) 0.1 ?Area: (cm?) 4.477 ?Volume: (cm?) 0.448 ?% Reduction in Area: 68.3% ?% Reduction in Volume: 84.2% ?Wound Description ?Classification: Grade 2 ?Exudate Amount: Medium ?Exudate Type: Serosanguineous ?Exudate Color: red, brown ?Treatment Notes ?Wound #18 (Metatarsal head first) Wound Laterality: Plantar, Left ?Cleanser ?Soap and Water ?Discharge Instruction: May shower and wash wound with dial antibacterial soap and water prior to dressing change. ?Wound Cleanser ?Discharge Instruction: Cleanse the wound with wound cleanser prior to applying a clean dressing using gauze sponges, not tissue or cotton ?balls. ?Peri-Wound  Care ?Triamcinolone 15 (g) ?Discharge Instruction: Use triamcinolone 15 (g) as directed ?Sween Lotion (Moisturizing lotion) ?Discharge Instruction: Apply moisturizing lotion as directed ?Topical ?Primary Dressing ?Promogran Prisma Matrix, 4.34 (sq in) (silver collagen) ?Discharge Instruction: Moisten collagen with saline or hydrogel ?Secondary Dressing ?ABD Pad, 5x9 ?Discharge Instruction: Apply over primary dressing as directed. ?Woven Gauze Sponge, Non-Sterile 4x4 in ?Discharge Instruction: Apply over primary dressing as directed. ?Secured With ?82M Medipore H Soft Cloth Surgical T ape, 4 x 10 (in/yd) ?Discharge Instruction: Secure with tape as directed. ?Compression Wrap ?Compression Stockings ?Add-Ons ?Electronic Signature(s) ?Signed: 10/16/2021 2:08:13 PM By: Sandre Kitty ?Signed: 10/16/2021 5:29:07 PM By: Levan Hurst RN, BSN ?Entered By: Sandre Kitty on 10/16/2021 11:44:23 ?-------------------------------------------------------------------------------- ?Wound Assessment Details ?Patient Name: ?Date of Service: ?[name] [name] RRY W. 10/16/2021 11:30 A M ?Medical Record Number: [id number] ?Patient Account Number: [id number] ?Date of Birth/Sex: ?Treating RN: ?1950-12-15 (71 y.o. [name] ?Primary Care Delno Blaisdell: ?Other Clinician: ?[name] ?Referring Mattthew Ziomek: ?Treating Tayt Moyers/Extender: Fredirick Maudlin ?[name] ?Weeks in Treatment: 26 ?Wound Status ?Wound Number: 22 ?Primary Etiology: Cyst ?Wound Location: Left, Lateral Lower Leg ?Wound Status: Open ?Wounding Event: Bump ?Date Acquired: 06/03/2021 ?Weeks Of Treatment: 19 ?Clustered Wound: No ?Wound Measurements ?Length: (cm) 4.4 ?Width: (cm) 2 ?Depth: (cm) 1.3 ?Area: (cm?) 6.912 ?Volume: (cm?) 8.985 ?% Reduction in Area: -319.2% ?% Reduction in Volume: -581.2% ?Wound Description ?Classification: Full Thickness With Exposed Support Structure ?Exudate Amount: Large ?Exudate Type: Serosanguineous ?Exudate Color: red, brown ?s ?Treatment  Notes ?Wound #22 (Lower Leg) Wound Laterality: Left, Lateral ?Cleanser ?Soap and Water ?Discharge Instruction: May shower and wash wound with dial antibacterial soap and water prior to dressing change. ?Wound Cleanser ?Discharge Instructio

## 2021-10-18 NOTE — Progress Notes (Signed)
?  ? ?  Subjective:  ?  ? Patient ID: [name], male   DOB: 1950/11/02, 71 y.o.   MRN: [id number] ? ?HPI 71 year old male well-known to me from previous left lower extremity bypass now with persistent wound of the left lateral leg.  Most recently he underwent I&D of the left leg wound and application of a wound VAC placement of an Unna boot.  Unfortunately continues to have a draining sinus.  He is followed in the wound care center. ? ? ?Review of Systems ?Draining wound left leg ?   ?Objective:  ? Physical Exam ?Vitals:  ? 10/18/21 1120  ?BP: 137/78  ?Pulse: (!) 58  ?Resp: 20  ?Temp: 98.1 ?F (36.7 ?C)  ?SpO2: 98%  ? ?Awake alert oriented ?Nonlabored respirations ?Left lower extremity lateral leg wound was able to be probed with an entire Q-tip this was repacked at bedside ?There is stable swelling of the left thigh  ?Dressing was left in place from the foot up to the mid leg ?   ?Assessment:  ?   ?71 year old male well-known to me from previous left lower extremity bypass now with persistent wound issues. ?   ?Plan:  ?   ?I have offered to reopen up the wound more extensively and placed a wound VAC but would require in-hospital stay until least the first wound VAC change.  He is going to discuss further with wound care center call to schedule if he is interested in further surgical intervention. ? ?Marlenne Ridge C. Donzetta Matters, MD ?[facility] ?Office: [phone] ?Pager: [phone] ? ?   ?

## 2021-10-18 NOTE — H&P (View-Only) (Signed)
?  ? ?  Subjective:  ?  ? Patient ID: [name], male   DOB: 1951/01/29, 71 y.o.   MRN: [id number] ? ?HPI 71 year old male well-known to me from previous left lower extremity bypass now with persistent wound of the left lateral leg.  Most recently he underwent I&D of the left leg wound and application of a wound VAC placement of an Unna boot.  Unfortunately continues to have a draining sinus.  He is followed in the wound care center. ? ? ?Review of Systems ?Draining wound left leg ?   ?Objective:  ? Physical Exam ?Vitals:  ? 10/18/21 1120  ?BP: 137/78  ?Pulse: (!) 58  ?Resp: 20  ?Temp: 98.1 ?F (36.7 ?C)  ?SpO2: 98%  ? ?Awake alert oriented ?Nonlabored respirations ?Left lower extremity lateral leg wound was able to be probed with an entire Q-tip this was repacked at bedside ?There is stable swelling of the left thigh  ?Dressing was left in place from the foot up to the mid leg ?   ?Assessment:  ?   ?71 year old male well-known to me from previous left lower extremity bypass now with persistent wound issues. ?   ?Plan:  ?   ?I have offered to reopen up the wound more extensively and placed a wound VAC but would require in-hospital stay until least the first wound VAC change.  He is going to discuss further with wound care center call to schedule if he is interested in further surgical intervention. ? ?Quina Wilbourne C. Donzetta Matters, MD ?[facility] ?Office: [phone] ?Pager: [phone] ? ?   ?

## 2021-10-19 ENCOUNTER — Encounter (HOSPITAL_BASED_OUTPATIENT_CLINIC_OR_DEPARTMENT_OTHER): Payer: Medicare Other | Admitting: General Surgery

## 2021-10-19 DIAGNOSIS — E11621 Type 2 diabetes mellitus with foot ulcer: Secondary | ICD-10-CM | POA: Diagnosis not present

## 2021-10-20 ENCOUNTER — Other Ambulatory Visit: Payer: Self-pay

## 2021-10-23 ENCOUNTER — Encounter (HOSPITAL_BASED_OUTPATIENT_CLINIC_OR_DEPARTMENT_OTHER): Payer: Medicare Other | Attending: General Surgery | Admitting: General Surgery

## 2021-10-23 DIAGNOSIS — E11621 Type 2 diabetes mellitus with foot ulcer: Secondary | ICD-10-CM | POA: Insufficient documentation

## 2021-10-23 DIAGNOSIS — L97528 Non-pressure chronic ulcer of other part of left foot with other specified severity: Secondary | ICD-10-CM | POA: Insufficient documentation

## 2021-10-23 DIAGNOSIS — L97828 Non-pressure chronic ulcer of other part of left lower leg with other specified severity: Secondary | ICD-10-CM | POA: Insufficient documentation

## 2021-10-23 DIAGNOSIS — I890 Lymphedema, not elsewhere classified: Secondary | ICD-10-CM | POA: Insufficient documentation

## 2021-10-23 DIAGNOSIS — E1151 Type 2 diabetes mellitus with diabetic peripheral angiopathy without gangrene: Secondary | ICD-10-CM | POA: Insufficient documentation

## 2021-10-23 DIAGNOSIS — I87322 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: Secondary | ICD-10-CM | POA: Insufficient documentation

## 2021-10-23 NOTE — Progress Notes (Signed)
[name], [name] ([id number]) ?Visit Report for 10/23/2021 ?SuperBill Details ?Patient Name: Date of Service: ?[name], [name] 10/23/2021 ?Medical Record Number: [id number] ?Patient Account Number: [id number] ?Date of Birth/Sex: Treating RN: ?1950/08/22 (71 y.o. [name] ?Primary Care Provider: Jilda [name] Other Clinician: ?Referring Provider: ?Treating Provider/Extender: Fredirick Maudlin ?[name] ?Weeks in Treatment: 27 ?Diagnosis Coding ?ICD-10 Codes ?Code Description ?E11.621 Type 2 diabetes mellitus with foot ulcer ?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 ?I89.0 Lymphedema, not elsewhere classified ?G01.749 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 ?S49.675 Non-pressure chronic ulcer of other part of left lower leg with other specified severity ?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 of other part of left foot with other specified severity ?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 ?Facility Procedures ?CPT4 Code Description Modifier Quantity ?[id number] 59935 - WOUND VAC-50 SQ CM OR LESS 1 ?Electronic Signature(s) ?Signed: 10/23/2021 12:09:08 PM By: Fredirick Maudlin MD FACS ?Signed: 10/23/2021 5:26:20 PM By: Baruch Gouty RN, BSN ?Entered By: Baruch Gouty on 10/23/2021 12:02:51 ?

## 2021-10-23 NOTE — Progress Notes (Signed)
GEORDIE, NOONEY ([id number]) ?Visit Report for 10/23/2021 ?Arrival Information Details ?Patient Name: Date of Service: ?[name] 10/23/2021 11:30 A M ?Medical Record Number: [id number] ?Patient Account Number: [id number] ?Date of Birth/Sex: Treating RN: ?1951-04-01 (71 y.o. [name] ?Primary Care Jourdain Guay: [name] Other Clinician: ?Referring Jahira Swiss: ?Treating Ameer Sanden/Extender: Fredirick Maudlin ?[name] ?Weeks in Treatment: 27 ?Visit Information History Since Last Visit ?Added or deleted any medications: No ?Patient Arrived: Ambulatory ?Any new allergies or adverse reactions: No ?Arrival Time: 11:29 ?Had a fall or experienced change in No ?Accompanied By: self ?activities of daily living that may affect ?Transfer Assistance: None ?risk of falls: ?Patient Identification Verified: Yes ?Signs or symptoms of abuse/neglect since last visito No ?Secondary Verification Process Completed: Yes ?Hospitalized since last visit: No ?Patient Requires Transmission-Based Precautions: No ?Implantable device outside of the clinic excluding No ?Patient Has Alerts: Yes ?cellular tissue based products placed in the center ?Patient Alerts: ?ABI's: 09/22 L:1.09 R: 1. since last visit: ?TBI's: R:0.72 L:0.99 ?Has Dressing in Place as Prescribed: Yes ?Has Footwear/Offloading in Place as Prescribed: Yes ?Left: T Contact Cast ?otal ?Pain Present Now: No ?Electronic Signature(s) ?Signed: 10/23/2021 5:26:20 PM By: Baruch Gouty RN, BSN ?Entered By: Baruch Gouty on 10/23/2021 11:31:13 ?-------------------------------------------------------------------------------- ?Encounter Discharge Information Details ?Patient Name: Date of Service: ?[place], [name] 10/23/2021 11:30 A M ?Medical Record Number: [id number] ?Patient Account Number: [id number] ?Date of Birth/Sex: Treating RN: ?1951-03-22 (71 y.o. [name] ?Primary Care Lola Lofaro: [name] Other Clinician: ?Referring Trevar Boehringer: ?Treating Vraj Denardo/Extender: Fredirick Maudlin ?[name] ?Weeks in Treatment: 27 ?Encounter Discharge Information Items ?Discharge Condition: Stable ?Ambulatory Status: Ambulatory ?Discharge Destination: Home ?Transportation: Private Auto ?Accompanied By: self ?Schedule Follow-up Appointment: Yes ?Clinical Summary of Care: Patient Declined ?Electronic Signature(s) ?Signed: 10/23/2021 5:26:20 PM By: Baruch Gouty RN, BSN ?Entered By: Baruch Gouty on 10/23/2021 12:02:41 ?-------------------------------------------------------------------------------- ?Negative Pressure Wound Therapy Maintenance (NPWT) Details ?Patient Name: ?Date of Service: ?[place], [name] 10/23/2021 11:30 A M ?Medical Record Number: [id number] ?Patient Account Number: [id number] ?Date of Birth/Sex: ?Treating RN: ?20-Feb-1951 (71 y.o. [name] ?Primary Care Herold Salguero: [name] ?Other Clinician: ?Referring Jakaden Ouzts: ?Treating Dunya Meiners/Extender: Fredirick Maudlin ?[name] ?Weeks in Treatment: 27 ?NPWT Maintenance Performed for: Wound #22 Left, Lateral Lower Leg ?Additional Injuries Covered: No ?Performed By: Baruch Gouty, RN ?Type: VAC System ?Coverage Size (sq cm): 6.02 ?Pressure Type: Constant ?Pressure Setting: 125 mmHG ?Drain Type: None ?Sponge/Dressing Type: ?Combination : white and black ?Date Initiated: 07/27/2021 ?Dressing Removed: No ?Quantity of Sponges/Gauze Removed: 2 ?Canister Changed: No ?Canister Exudate Volume: 250 ?Dressing Reapplied: No ?Quantity of Sponges/Gauze Inserted: 1 white, 1 black ?Respones T Treatment: ?o good ?Days On NPWT : 89 ?Electronic Signature(s) ?Signed: 10/23/2021 5:26:20 PM By: Baruch Gouty RN, BSN ?Entered By: Baruch Gouty on 10/23/2021 12:00:11 ?-------------------------------------------------------------------------------- ?Patient/Caregiver Education Details ?Patient Name: ?Date of Service: ?[place], [name] 5/1/2023andnbsp11:30 A M ?Medical Record Number: [id number] ?Patient Account Number: [id number] ?Date of  Birth/Gender: ?Treating RN: ?October 04, 1950 (71 y.o. [name] ?Primary Care Physician: [name] ?Other Clinician: ?Referring Physician: ?Treating Physician/Extender: Fredirick Maudlin ?[name] ?Weeks in Treatment: 27 ?Education Assessment ?Education Provided To: ?Patient ?Education Topics Provided ?Offloading: ?Methods: Explain/Verbal ?Responses: Reinforcements needed, State content correctly ?Wound/Skin Impairment: ?Methods: Explain/Verbal ?Responses: Reinforcements needed, State content correctly ?Electronic Signature(s) ?Signed: 10/23/2021 5:26:20 PM By: Baruch Gouty RN, BSN ?Entered By: Baruch Gouty on 10/23/2021 12:02:28 ?-------------------------------------------------------------------------------- ?Wound Assessment Details ?Patient Name: ?Date of Service: ?[place], [name] 10/23/2021 11:30 A M ?Medical Record Number: [id number] ?Patient  Account Number: [id number] ?Date of Birth/Sex: ?Treating RN: ?11-06-1950 (71 y.o. [name] ?Primary Care Nhu Glasby: [name] ?Other Clinician: ?Referring Bejamin Hackbart: ?Treating Nazifa Trinka/Extender: Fredirick Maudlin ?[name] ?Weeks in Treatment: 27 ?Wound Status ?Wound Number: 18 ?Primary Etiology: Diabetic Wound/Ulcer of the Lower Extremity ?Wound Location: Left, Plantar Metatarsal head first ?Wound Status: Open ?Wounding Event: Gradually Appeared ?Date Acquired: 08/23/2020 ?Weeks Of Treatment: 27 ?Clustered Wound: No ?Wound Measurements ?Length: (cm) 1.9 ?Width: (cm) 2.3 ?Depth: (cm) 0.1 ?Area: (cm?) 3.432 ?Volume: (cm?) 0.343 ?% Reduction in Area: 75.7% ?% Reduction in Volume: 87.9% ?Wound Description ?Classification: Grade 2 ?Exudate Amount: Medium ?Exudate Type: Serosanguineous ?Exudate Color: red, brown ?Treatment Notes ?Wound #18 (Metatarsal head first) Wound Laterality: Plantar, Left ?Cleanser ?Soap and Water ?Discharge Instruction: May shower and wash wound with dial antibacterial soap and water prior to dressing change. ?Wound  Cleanser ?Discharge Instruction: Cleanse the wound with wound cleanser prior to applying a clean dressing using gauze sponges, not tissue or cotton ?balls. ?Peri-Wound Care ?Triamcinolone 15 (g) ?Discharge Instruction: Use triamcinolone 15 (g) as directed ?Sween Lotion (Moisturizing lotion) ?Discharge Instruction: Apply moisturizing lotion as directed ?Topical ?Primary Dressing ?Promogran Prisma Matrix, 4.34 (sq in) (silver collagen) ?Discharge Instruction: Moisten collagen with saline or hydrogel ?Secondary Dressing ?Optifoam Non-Adhesive Dressing, 4x4 in ?Discharge Instruction: Apply over primary dressing as directed. ?Woven Gauze Sponge, Non-Sterile 4x4 in ?Discharge Instruction: Apply over primary dressing as directed. ?Secured With ?37M Medipore H Soft Cloth Surgical T ape, 4 x 10 (in/yd) ?Discharge Instruction: Secure with tape as directed. ?Compression Wrap ?Compression Stockings ?Add-Ons ?Notes ?TCC removed for surgery tomorrow per Dr. Celine Ahr ?Electronic Signature(s) ?Signed: 10/23/2021 5:26:20 PM By: Baruch Gouty RN, BSN ?Entered By: Baruch Gouty on 10/23/2021 11:34:45 ?-------------------------------------------------------------------------------- ?Wound Assessment Details ?Patient Name: ?Date of Service: ?[place], [name] 10/23/2021 11:30 A M ?Medical Record Number: [id number] ?Patient Account Number: [id number] ?Date of Birth/Sex: ?Treating RN: ?Feb 28, 1951 (71 y.o. [name] ?Primary Care Mannat Benedetti: [name] ?Other Clinician: ?Referring Dakarai Mcglocklin: ?Treating Lux Skilton/Extender: Fredirick Maudlin ?[name] ?Weeks in Treatment: 27 ?Wound Status ?Wound Number: 22 ?Primary Etiology: Cyst ?Wound Location: Left, Lateral Lower Leg ?Wound Status: Open ?Wounding Event: Bump ?Date Acquired: 06/03/2021 ?Weeks Of Treatment: 20 ?Clustered Wound: No ?Wound Measurements ?Length: (cm) 4.3 ?Width: (cm) 1.4 ?Depth: (cm) 1.4 ?Area: (cm?) 4.728 ?Volume: (cm?) 6.619 ?% Reduction in Area: -186.7% ?% Reduction in  Volume: -401.8% ?Wound Description ?Classification: Full Thickness With Exposed Support Structure ?Exudate Amount: Large ?Exudate Type: Serosanguineous ?Exudate Color: red, brown ?s ?Treatment Notes ?Wound #22 (Lower Leg) Wound Lat

## 2021-10-23 NOTE — Anesthesia Preprocedure Evaluation (Addendum)
Anesthesia Evaluation  ?Patient identified by MRN, date of birth, ID band ?Patient awake ? ? ? ?Reviewed: ?Allergy & Precautions, NPO status , Patient's Chart, lab work & pertinent test results ? ?Airway ?Mallampati: III ? ?TM Distance: >3 FB ?Neck ROM: Full ? ? ? Dental ? ?(+) Dental Advisory Given, Edentulous Upper, Missing ?  ?Pulmonary ?sleep apnea and Continuous Positive Airway Pressure Ventilation , former smoker,  ?  ?Pulmonary exam normal ?breath sounds clear to auscultation ? ? ? ? ? ? Cardiovascular ?hypertension, Pt. on medications ?+ Peripheral Vascular Disease  ?Normal cardiovascular exam ?Rhythm:Regular Rate:Normal ? ? ?  ?Neuro/Psych ?PSYCHIATRIC DISORDERS Depression  Neuromuscular disease   ? GI/Hepatic ?negative GI ROS, Neg liver ROS,   ?Endo/Other  ?diabetes (Insulin pump), Type 2, Insulin DependentObesity ? ? Renal/GU ?Renal InsufficiencyRenal disease  ? ?  ?Musculoskeletal ? ?(+) Arthritis ,  Open wound of left lower leg with complication  ? Abdominal ?  ?Peds ? Hematology ? ?(+) Blood dyscrasia (Plavix), ,   ?Anesthesia Other Findings ? ? Reproductive/Obstetrics ? ?  ? ? ? ? ? ? ? ? ? ? ? ? ? ?  ?  ? ? ? ? ? ? ? ?Anesthesia Physical ?Anesthesia Plan ? ?ASA: 3 ? ?Anesthesia Plan: General  ? ?Post-op Pain Management: Tylenol PO (pre-op)*  ? ?Induction: Intravenous ? ?PONV Risk Score and Plan: 2 and Dexamethasone and Ondansetron ? ?Airway Management Planned: LMA ? ?Additional Equipment:  ? ?Intra-op Plan:  ? ?Post-operative Plan: Extubation in OR ? ?Informed Consent: I have reviewed the patients History and Physical, chart, labs and discussed the procedure including the risks, benefits and alternatives for the proposed anesthesia with the patient or authorized representative who has indicated his/her understanding and acceptance.  ? ? ? ?Dental advisory given ? ?Plan Discussed with: CRNA ? ?Anesthesia Plan Comments:   ? ? ? ? ? ?Anesthesia Quick Evaluation ? ?

## 2021-10-23 NOTE — Progress Notes (Signed)
HARSHAAN, [name] ([id number]) ?Visit Report for 10/19/2021 ?Chief Complaint Document Details ?Patient Name: Date of Service: ?[place], [name] 10/19/2021 10:45 A M ?Medical Record Number: [id number] ?Patient Account Number: [id number] ?Date of Birth/Sex: Treating RN: ?Jun 23, 1951 (71 y.o. Janyth [name] ?Primary Care Provider: [name] Other Clinician: ?Referring Provider: ?Treating Provider/Extender: Fredirick Maudlin ?[name] ?Weeks in Treatment: 27 ?Information Obtained from: Patient ?Chief Complaint ?Left leg and foot ulcers ?04/12/2021; patient is here for wounds on his left lower leg and left plantar foot over the first metatarsal head ?Electronic Signature(s) ?Signed: 10/19/2021 12:45:18 PM By: Fredirick Maudlin MD FACS ?Entered By: Fredirick Maudlin on 10/19/2021 12:45:18 ?-------------------------------------------------------------------------------- ?HPI Details ?Patient Name: Date of Service: ?[name], [name] 10/19/2021 10:45 A M ?Medical Record Number: [id number] ?Patient Account Number: [id number] ?Date of Birth/Sex: Treating RN: ?1950-09-20 (71 y.o. Janyth [name] ?Primary Care Provider: [name] Other Clinician: ?Referring Provider: ?Treating Provider/Extender: Fredirick Maudlin ?[name] ?Weeks in Treatment: 27 ?History of Present Illness ?HPI Description: 10/11/17; [name] is a 71 year old man who tells me that in 2015 he slipped down the latter traumatizing his left leg. He developed a wound ?in the same spot the area that we are currently looking at. He states this closed over for the most part although he always felt it was somewhat unstable. In ?2016 he hit the same area with the door of his car had this reopened. He tells me that this is never really closed although sometimes an inflow it remains open ?on a constant basis. He has not been using any specific dressing to this except for topical antibiotics the nature of which were not really sure. ?His primary doctor did send him to see  Dr. [name] of interventional cardiology. He underwent an angiogram on 08/06/17 and he underwent a PTA and directional ?atherectomy of the lesser distal SFA and popliteal arteries which resulted in brisk improvement in blood flow. It was noted that he had 2 vessel runoff through ?the anterior tibial and peroneal. He is also been to see vascular and interventional radiologist. He was not felt to have any significant superficial venous ?insufficiency. Presumably is not a candidate for any ablation. It was suggested he come here for wound care. ?The patient is a type II diabetic on insulin. He also has a history of venous insufficiency. ?ABIs on the left were noncompressible in our clinic ?10/21/17; patient we admitted to the clinic last week. 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 We ?put Iodosorb on him after an aggressive debridement and 3 layer compression. He complained of pain in his ankle and itching with is skin in fact he scratched ?the area on the medial calf superiorly at the rim of our wraps and he has 2 small open areas in that location today which are new. I changed his primary ?dressing today to silver collagen. As noted he is already had revascularization and does not have any significant superficial venous insufficiency that would ?be amenable to ablation ?10/28/17; patient admitted to the clinic 2 weeks ago. He has a smaller Wound. Scratch injury from last week revealed. There is large wound over the tibial area. ?This is smaller. Granulation looks healthy. No need for debridement. ?11/04/17; the wound on the left lateral calf looks better. Improved dimensions. Surface of this looks better. We've been maintaining him and Kerlix Coban ?wraps. He finds this much more comfortable. Silver collagen dressing ?11/11/17; left lateral Wound continues  to look healthy be making progress. Using a #5 curet I removed removed nonviable skin from the  surface of the wound ?and then necrotic debris from the wound surface. Surface of the wound continues to look healthy. ?He also has an open area on the left great toenail bed. We've been using topical antibiotics. ?11/19/17; left anterior lateral wound continues to look healthy but it's not closed. ?He also had a small wound above this on the left leg ?Initially traumatic wounds in the setting of significant chronic venous insufficiency and stasis dermatitis ?11/25/17; left anterior wounds superiorly is closed still a small wound inferiorly. ?12/02/17; left anterior tibial area. Arrives today with adherent callus. Post debridement clearly not completely closed. Hydrofera Blue under 3 layer compression. ?12/09/17; left anterior tibia. Circumferential eschar however the wound bed looks stable to improved. We've been using Hydrofera Blue under 3 layer ?compression ?12/17/17; left anterior tibia. Apparently this was felt to be closed however when the wrap was taken off there is a skin tear to reopen wounds in the same area ?we've been using Hydrofera Blue under 3 layer compression ?12/23/17 left anterior tibia. Not close to close this week apparently the [facility] was stuck to this again. Still circumferential eschar requiring debridement. I ?put a contact layer on this this time under the Hydrofera Blue ?12/31/17; left anterior tibia. Wound is better slight amount of hyper-granulation. Using Hydrofera Blue over Adaptic. ?01/07/18; left anterior tibia. The wound had some surface eschar however after this was removed he has no open wound.he was already revascularized by Dr. ?Ganji when he came to our clinic with atherectomy of the left SFA and popliteal artery. He was also sent to interventional radiology for venous reflux studies. ?He was not felt to have significant reflux but certainly has chronic venous changes of his skin with hemosiderin deposition around this area. He will definitely ?need to lubricate his skin and wear  compression stocking and I've talked to him about this. ?READMISSION ?05/26/2018 ?This is a now 71 year old man we cared for with traumatic wounds on his left anterior lower extremity. He had been previously revascularized during that ?admission by Dr. [name]. Apparently in follow-up Dr. [name] noted that he had deterioration in his arterial status. He underwent a stent placement in the distal left ?SFA on 04/22/2018. 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 He went back to the angiography suite on 04/30/2018 he underwent PTA and ?balloon angioplasty of the occluded left mid anterior tibial artery, thrombotic occlusion went from 100 to 0% which reconstitutes the posterior tibial artery. He ?had thrombectomy and aspiration of the peroneal artery. The stent placed in the distal SFA left SFA was still occluded. 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 ?He has been recuperating at [place] [facility] in [facility] after the most recent hospitalization. He is going home tomorrow. He tells me that 2 weeks ?ago he traumatized the tip of his left fifth toe. He came in urgently for our review of this. This was a history of before I noted that Dr. [name] had already noted ?dry gangrenous changes of the left fifth toe ?06/09/2018; 2-week follow-up. I did contact Dr. [name] after his last appointment and he apparently saw 1 of Dr. Irven Shelling colleagues  the next day. He does not ?follow-up with Dr. [name] himself until Thursday of this week. 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 Nevertheless there is no evidence of ?infection no drainage and no pain. He had a new area that this week when we were signing him in today on the left anterior mid tibia  area, this is in close ?proximity to the previous wound we have dealt with in this clinic. ?06/23/2018; 2-week follow-up. I did not receive a recent note from Dr. [name] to review today. Our office is try

## 2021-10-23 NOTE — Progress Notes (Signed)
TWO VISITORS ARE ALLOWED TO COME WITH YOU AND STAY IN THE SURGICAL WAITING ROOM ONLY DURING PRE OP AND PROCEDURE DAY OF SURGERY.  ? ?Two VISITORS MAY VISIT WITH YOU AFTER SURGERY IN YOUR PRIVATE ROOM DURING VISITING HOURS ONLY! ? ?PCP - Dr [name] ?Cardiologist - Dr Adrian Prows ? ?Chest x-ray - n/a ?EKG - 06/11/21 ?Stress Test - 05/13/17 ?ECHO - 12/02/12 ?Cardiac Cath - n/a ? ?ICD Pacemaker/Loop - n/a ? ?Sleep Study -  Yes ?CPAP - uses CPAP nightly ? ?Informed Tama Headings, Diabetes Coordinator .  Patient's surgery less than 2 hours.  Patient would not have to remove Dexcom G6 system. ? ?Patient informed to reduce Basil rate by 20% at midnight tonight.  Requested patient to bring additional supplies per [facility] for his Dexcom G6 system. ?Treat a low blood sugar (less than 70 mg/dL) with ? cup of clear juice (cranberry or apple), 4 glucose tablets, OR glucose gel. ?Recheck blood sugar in 15 minutes after treatment (to make sure it is greater than 70 mg/dL). If your blood sugar is not greater than 70 mg/dL on recheck, call [phone] for further instructions. ? ?Blood Thinner Instructions:  Follow your surgeon's instructions on when to stop Plavix prior to surgery.  Patient informed pharmacy that he had stopped Plavix.  RN will get the LD taken on DOS and will document.   ? ?Anesthesia review: Yes ? ?STOP now taking any Aspirin (unless otherwise instructed by your surgeon), Aleve, Naproxen, Ibuprofen, Motrin, Advil, Goody's, BC's, all herbal medications, fish oil, and all vitamins.  ?

## 2021-10-23 NOTE — Progress Notes (Signed)
[name], [name] ([id number]) ?Visit Report for 10/19/2021 ?Arrival Information Details ?Patient Name: Date of Service: ?[name], [name] 10/19/2021 10:45 A M ?Medical Record Number: [id number] ?Patient Account Number: [id number] ?Date of Birth/Sex: Treating RN: ?October 10, 1950 (71 y.o. [name] ?Primary Care [name]: [name] Other Clinician: ?Referring [name]: ?Treating [name] ?[name] ?Weeks in Treatment: 27 ?Visit Information History Since Last Visit ?Added or deleted any medications: No ?Patient Arrived: Ambulatory ?Any new allergies or adverse reactions: No ?Arrival Time: 11:06 ?Had a fall or experienced change in No ?Accompanied By: self ?activities of daily living that may affect ?Transfer Assistance: None ?risk of falls: ?Patient Identification Verified: Yes ?Signs or symptoms of abuse/neglect since last visito No ?Secondary Verification Process Completed: Yes ?Hospitalized since last visit: No ?Patient Requires Transmission-Based Precautions: No ?Implantable device outside of the clinic excluding No ?Patient Has Alerts: Yes ?cellular tissue based products placed in the center ?Patient Alerts: ?ABI's: 09/22 L:1.09 R: 1. since last visit: ?TBI's: R:0.72 L:0.99 ?Has Dressing in Place as Prescribed: Yes ?Pain Present Now: No ?Electronic Signature(s) ?Signed: 10/20/2021 9:12:08 AM By: [name] ?Entered By: [name] on 10/19/2021 11:07:24 ?-------------------------------------------------------------------------------- ?Encounter Discharge Information Details ?Patient Name: Date of Service: ?[name], [name] 10/19/2021 10:45 A M ?Medical Record Number: [id number] ?Patient Account Number: [id number] ?Date of Birth/Sex: Treating RN: ?09/25/1950 (71 y.o. [name] ?Primary Care [name]: [name] Other Clinician: ?Referring [name]: ?Treating [name] ?[name] ?Weeks in Treatment: 27 ?Encounter Discharge Information  Items ?Discharge Condition: Stable ?Ambulatory Status: Ambulatory ?Discharge Destination: Home ?Transportation: Private Auto ?Accompanied By: self ?Schedule Follow-up Appointment: Yes ?Clinical Summary of Care: Patient Declined ?Electronic Signature(s) ?Signed: 10/19/2021 5:06:54 PM By: [name] ?Entered By: [name] Gouty on 10/19/2021 12:16:18 ?-------------------------------------------------------------------------------- ?Lower Extremity Assessment Details ?Patient Name: ?Date of Service: ?[name], [name] 10/19/2021 10:45 A M ?Medical Record Number: [id number] ?Patient Account Number: [id number] ?Date of Birth/Sex: ?Treating RN: ?09-27-1950 (71 y.o. [name] ?Primary Care [name]: [name] ?Other Clinician: ?Referring [name]: ?Treating [name] ?[name] ?Weeks in Treatment: 27 ?Edema Assessment ?Assessed: [Left: No] [Right: No] ?Edema: [Left: Ye] [Right: s] ?Calf ?Left: Right: ?Point of Measurement: 41 cm From Medial Instep 43 cm ?Ankle ?Left: Right: ?Point of Measurement: 10 cm From Medial Instep 28.5 cm ?Vascular Assessment ?Pulses: ?Dorsalis Pedis ?Palpable: [Left:Yes] ?Electronic Signature(s) ?Signed: 10/19/2021 5:06:54 PM By: [name] ?Entered By: [name] Gouty on 10/19/2021 11:23:11 ?-------------------------------------------------------------------------------- ?Multi Wound Chart Details ?Patient Name: ?Date of Service: ?[name], [name] 10/19/2021 10:45 A M ?Medical Record Number: [id number] ?Patient Account Number: [id number] ?Date of Birth/Sex: ?Treating RN: ?09/20/1950 (71 y.o. [name] ?Primary Care [name]: [name] ?Other Clinician: ?Referring [name]: ?Treating [name] ?[name] ?Weeks in Treatment: 27 ?Vital Signs ?Height(in): 74 ?Capillary Blood Glucose(mg/dl): 205 ?Weight(lbs): 238 ?Pulse(bpm): 83 ?Body Mass Index(BMI): 30.6 ?Blood Pressure(mmHg): 159/83 ?Temperature(??F):  98.6 ?Respiratory Rate(breaths/min): 18 ?Photos: [18:Left, Plantar Metatarsal head first] [22:Left, Lateral Lower Leg] [24:Left, Medial Upper Leg] ?Wound Location: [18:Gradually Appeared] [22:Bump] [24:Trauma] ?Wounding Event: [18:Diabetic Wound/Ulcer of the Lower] [22:Cyst] [24:Abrasion] ?Primary Etiology: [18:Extremity Glaucoma, Sleep Apnea,] [22:Glaucoma, Sleep Apnea,] [24:Glaucoma, Sleep Apnea,] ?Comorbid History: [18:Hypertension, Peripheral Arterial Disease, Peripheral Venous Disease, Disease, Peripheral Venous Disease, Disease, Peripheral Venous Disease, Type II Diabetes, Gout, Osteoarthritis, Neuropathy 08/23/2020] [22:Hypertension, Peripheral  ?Arterial Neuropathy 06/03/2021] [24:Hypertension, Peripheral Arterial Type II Diabetes, Gout, Osteoarthritis, Neuropathy 09/14/2021] ?Date Acquired: [18:27] [22:19] [24:4] ?Weeks of Treatment: [18:Open] [22:Open] [24:Open] ?Wound Status: [18:No] [22:No] [24:No] ?  Wound Recurrence: [18:1.9x2.3x0.1] [22:4.3x1.4x1.4] [24:0.8x0.7x0.1] ?Measurements L x W x D (cm) [18:3.432] [54:5.625] [24:0.44] ?A (cm?) : ?rea [18:0.343] [63:8.937] [24:0.044] ?Volume (cm?) : [18:75.70%] [22:-186.70%] [24:73.30%] ?% Reduction in A rea: [18:87.90%] [22:-401.80%] [24:73.30%] ?% Reduction in Volume: [22:12] ?Position 1 (o'clock): [22:15] ?Maximum Distance 1 (cm): [18:No] [22:Yes] [24:No] ?Tunneling: [18:Grade 2] [22:Full Thickness With Exposed Support Full Thickness Without Exposed] ?Classification: [18:Medium] [22:Structures Large] [24:Support Structures Medium] ?Exudate Amount: [18:Serosanguineous] [22:Serosanguineous] [24:Serosanguineous] ?Exudate Type: [18:red, brown] [22:red, brown] [24:red, brown] ?Exudate Color: [18:Thickened] [22:Flat and Intact] [24:Flat and Intact] ?Wound Margin: [18:Large (67-100%)] [22:Large (67-100%)] [24:Large (67-100%)] ?Granulation Amount: [18:Red, Pink] [22:Red] [24:Pink, Pale] ?Granulation Quality: [18:None Present (0%)] [22:None Present (0%)] [24:None Present  (0%)] ?Necrotic Amount: ?[18:Fat Layer (Subcutaneous Tissue): Yes Fat Layer (Subcutaneous Tissue): Yes Fat Layer (Subcutaneous Tissue): Yes] ?Exposed Structures: ?[18:Fascia: No Tendon: No Muscle: No Joint: No Bone: No Medium (34-66%)] [22:Fascia: No Tendon: No Muscle: No Joint: No Bone: No Small (1-33%)] [24:Fascia: No Tendon: No Muscle: No Joint: No Bone: No Small (1-33%)] ?Epithelialization: [18:T Contact Cast otal] [22:Negative Pressure Wound Therapy] [24:N/A] ?Procedures Performed: [22:Maintenance (NPWT)] ?Treatment Notes ?Wound #18 (Metatarsal head first) Wound Laterality: Plantar, Left ?Cleanser ?Soap and Water ?Discharge Instruction: May shower and wash wound with dial antibacterial soap and water prior to dressing change. ?Wound Cleanser ?Discharge Instruction: Cleanse the wound with wound cleanser prior to applying a clean dressing using gauze sponges, not tissue or cotton balls. ?Peri-Wound Care ?Sween Lotion (Moisturizing lotion) ?Discharge Instruction: Apply moisturizing lotion as directed ?Topical ?Primary Dressing ?Promogran Prisma Matrix, 4.34 (sq in) (silver collagen) ?Discharge Instruction: Moisten collagen with saline or hydrogel ?Secondary Dressing ?Woven Gauze Sponge, Non-Sterile 4x4 in ?Discharge Instruction: Apply over primary dressing as directed. ?Secured With ?39M Medipore H Soft Cloth Surgical T ape, 4 x 10 (in/yd) ?Discharge Instruction: Secure with tape as directed. ?Compression Wrap ?Compression Stockings ?Add-Ons ?Wound #22 (Lower Leg) Wound Laterality: Left, Lateral ?Cleanser ?Soap and Water ?Discharge Instruction: May shower and wash wound with dial antibacterial soap and water prior to dressing change. ?Wound Cleanser ?Discharge Instruction: Cleanse the wound with wound cleanser prior to applying a clean dressing using gauze sponges, not tissue or cotton balls. ?Peri-Wound Care ?Sween Lotion (Moisturizing lotion) ?Discharge Instruction: Apply moisturizing lotion as  directed ?Topical ?Primary Dressing ?VAC ?Secondary Dressing ?Secured With ?Compression Wrap ?Compression Stockings ?Add-Ons ?Wound #24 (Upper Leg) Wound Laterality: Left, Medial ?Cleanser ?Soap and Water ?Discharge Instruc

## 2021-10-24 ENCOUNTER — Inpatient Hospital Stay (HOSPITAL_COMMUNITY): Payer: BC Managed Care – PPO | Admitting: Emergency Medicine

## 2021-10-24 ENCOUNTER — Encounter (HOSPITAL_COMMUNITY)
Admission: RE | Disposition: A | Discharge status: Home / Self Care | Payer: Self-pay | Source: Home / Self Care | Attending: Vascular Surgery

## 2021-10-24 ENCOUNTER — Other Ambulatory Visit: Payer: Self-pay

## 2021-10-24 ENCOUNTER — Encounter (HOSPITAL_COMMUNITY): Payer: Self-pay | Admitting: Vascular Surgery

## 2021-10-24 ENCOUNTER — Inpatient Hospital Stay (HOSPITAL_COMMUNITY)
Admission: RE | Admit: 2021-10-24 | Discharge: 2021-10-26 | DRG: 909 | Disposition: A | Discharge status: Home / Self Care | Payer: BC Managed Care – PPO | Attending: Vascular Surgery | Admitting: Vascular Surgery

## 2021-10-24 DIAGNOSIS — Z87891 Personal history of nicotine dependence: Secondary | ICD-10-CM | POA: Diagnosis not present

## 2021-10-24 DIAGNOSIS — E1142 Type 2 diabetes mellitus with diabetic polyneuropathy: Secondary | ICD-10-CM | POA: Diagnosis present

## 2021-10-24 DIAGNOSIS — Z794 Long term (current) use of insulin: Secondary | ICD-10-CM

## 2021-10-24 DIAGNOSIS — Y838 Other surgical procedures as the cause of abnormal reaction of the patient, or of later complication, without mention of misadventure at the time of the procedure: Secondary | ICD-10-CM | POA: Diagnosis present

## 2021-10-24 DIAGNOSIS — T82898A Other specified complication of vascular prosthetic devices, implants and grafts, initial encounter: Secondary | ICD-10-CM | POA: Diagnosis not present

## 2021-10-24 DIAGNOSIS — Z6834 Body mass index (BMI) 34.0-34.9, adult: Secondary | ICD-10-CM | POA: Diagnosis not present

## 2021-10-24 DIAGNOSIS — M1990 Unspecified osteoarthritis, unspecified site: Secondary | ICD-10-CM | POA: Diagnosis present

## 2021-10-24 DIAGNOSIS — E7800 Pure hypercholesterolemia, unspecified: Secondary | ICD-10-CM | POA: Diagnosis present

## 2021-10-24 DIAGNOSIS — I129 Hypertensive chronic kidney disease with stage 1 through stage 4 chronic kidney disease, or unspecified chronic kidney disease: Secondary | ICD-10-CM | POA: Diagnosis present

## 2021-10-24 DIAGNOSIS — N183 Chronic kidney disease, stage 3 unspecified: Secondary | ICD-10-CM | POA: Diagnosis present

## 2021-10-24 DIAGNOSIS — Z9641 Presence of insulin pump (external) (internal): Secondary | ICD-10-CM | POA: Diagnosis present

## 2021-10-24 DIAGNOSIS — G4733 Obstructive sleep apnea (adult) (pediatric): Secondary | ICD-10-CM | POA: Diagnosis present

## 2021-10-24 DIAGNOSIS — E1151 Type 2 diabetes mellitus with diabetic peripheral angiopathy without gangrene: Secondary | ICD-10-CM | POA: Diagnosis present

## 2021-10-24 DIAGNOSIS — Z95820 Peripheral vascular angioplasty status with implants and grafts: Secondary | ICD-10-CM | POA: Diagnosis not present

## 2021-10-24 DIAGNOSIS — E669 Obesity, unspecified: Secondary | ICD-10-CM | POA: Diagnosis present

## 2021-10-24 DIAGNOSIS — T8189XA Other complications of procedures, not elsewhere classified, initial encounter: Principal | ICD-10-CM | POA: Diagnosis present

## 2021-10-24 DIAGNOSIS — I739 Peripheral vascular disease, unspecified: Secondary | ICD-10-CM

## 2021-10-24 DIAGNOSIS — S81802D Unspecified open wound, left lower leg, subsequent encounter: Principal | ICD-10-CM

## 2021-10-24 DIAGNOSIS — S81809A Unspecified open wound, unspecified lower leg, initial encounter: Principal | ICD-10-CM | POA: Diagnosis present

## 2021-10-24 HISTORY — PX: INCISION AND DRAINAGE OF WOUND: SHX1803

## 2021-10-24 HISTORY — PX: APPLICATION OF WOUND VAC: SHX5189

## 2021-10-24 LAB — POCT I-STAT, CHEM 8
BUN: 28 mg/dL — ABNORMAL HIGH (ref 8–23)
Calcium, Ion: 1.16 mmol/L (ref 1.15–1.40)
Chloride: 103 mmol/L (ref 98–111)
Creatinine, Ser: 1.7 mg/dL — ABNORMAL HIGH (ref 0.61–1.24)
Glucose, Bld: 218 mg/dL — ABNORMAL HIGH (ref 70–99)
HCT: 37 % — ABNORMAL LOW (ref 39.0–52.0)
Hemoglobin: 12.6 g/dL — ABNORMAL LOW (ref 13.0–17.0)
Potassium: 4 mmol/L (ref 3.5–5.1)
Sodium: 137 mmol/L (ref 135–145)
TCO2: 23 mmol/L (ref 22–32)

## 2021-10-24 LAB — GLUCOSE, CAPILLARY
Glucose-Capillary: 220 mg/dL — ABNORMAL HIGH (ref 70–99)
Glucose-Capillary: 220 mg/dL — ABNORMAL HIGH (ref 70–99)
Glucose-Capillary: 227 mg/dL — ABNORMAL HIGH (ref 70–99)
Glucose-Capillary: 293 mg/dL — ABNORMAL HIGH (ref 70–99)
Glucose-Capillary: 314 mg/dL — ABNORMAL HIGH (ref 70–99)

## 2021-10-24 LAB — CBC
HCT: 36.2 % — ABNORMAL LOW (ref 39.0–52.0)
Hemoglobin: 11.7 g/dL — ABNORMAL LOW (ref 13.0–17.0)
MCH: 25.5 pg — ABNORMAL LOW (ref 26.0–34.0)
MCHC: 32.3 g/dL (ref 30.0–36.0)
MCV: 78.9 fL — ABNORMAL LOW (ref 80.0–100.0)
Platelets: 201 10*3/uL (ref 150–400)
RBC: 4.59 MIL/uL (ref 4.22–5.81)
RDW: 17.1 % — ABNORMAL HIGH (ref 11.5–15.5)
WBC: 6.4 10*3/uL (ref 4.0–10.5)
nRBC: 0 % (ref 0.0–0.2)

## 2021-10-24 LAB — CREATININE, SERUM
Creatinine, Ser: 1.73 mg/dL — ABNORMAL HIGH (ref 0.61–1.24)
GFR, Estimated: 42 mL/min — ABNORMAL LOW (ref >60)

## 2021-10-24 LAB — SURGICAL PCR SCREEN
MRSA, PCR: POSITIVE — AB
Staphylococcus aureus: POSITIVE — AB

## 2021-10-24 SURGERY — IRRIGATION AND DEBRIDEMENT WOUND
Anesthesia: General | Site: Leg Lower | Laterality: Left

## 2021-10-24 MED ORDER — PHENOL 1.4 % MT LIQD: 1.0000 | OROMUCOSAL | Status: DC | PRN

## 2021-10-24 MED ORDER — ORAL CARE MOUTH RINSE: 15.0000 mL | Freq: Once | OROMUCOSAL | Status: AC

## 2021-10-24 MED ORDER — FENTANYL CITRATE (PF) 100 MCG/2ML IJ SOLN
25.0000 ug | INTRAMUSCULAR | Status: DC | PRN
  Administered 2021-10-24: 25 ug via INTRAVENOUS

## 2021-10-24 MED ORDER — CHLORHEXIDINE GLUCONATE 0.12 % MT SOLN: 15.0000 mL | Freq: Once | OROMUCOSAL | Status: AC

## 2021-10-24 MED ORDER — 0.9 % SODIUM CHLORIDE (POUR BTL) OPTIME
TOPICAL | Status: DC | PRN
  Administered 2021-10-24: 1000 mL

## 2021-10-24 MED ORDER — PROPOFOL 10 MG/ML IV BOLUS
INTRAVENOUS | Status: AC
  Filled 2021-10-24: qty 40

## 2021-10-24 MED ORDER — CEFAZOLIN IN SODIUM CHLORIDE 3-0.9 GM/100ML-% IV SOLN
3.0000 g | INTRAVENOUS | Status: AC
  Administered 2021-10-24: 3 g via INTRAVENOUS

## 2021-10-24 MED ORDER — HEPARIN SODIUM (PORCINE) 5000 UNIT/ML IJ SOLN
5000.0000 [IU] | Freq: Three times a day (TID) | INTRAMUSCULAR | Status: DC
  Administered 2021-10-25 – 2021-10-26 (×4): 5000 [IU] via SUBCUTANEOUS
  Filled 2021-10-24 (×4): qty 1

## 2021-10-24 MED ORDER — BISACODYL 10 MG RE SUPP
10.0000 mg | Freq: Every day | RECTAL | Status: DC | PRN
Start: 2021-10-24 — End: 2021-10-26

## 2021-10-24 MED ORDER — MIDAZOLAM HCL 2 MG/2ML IJ SOLN
INTRAMUSCULAR | Status: AC
  Filled 2021-10-24: qty 2

## 2021-10-24 MED ORDER — ACETAMINOPHEN 500 MG PO TABS: 1000.0000 mg | ORAL_TABLET | Freq: Once | ORAL | Status: AC

## 2021-10-24 MED ORDER — CHLORHEXIDINE GLUCONATE 4 % EX LIQD: 60.0000 mL | Freq: Once | CUTANEOUS | Status: DC

## 2021-10-24 MED ORDER — PROPOFOL 10 MG/ML IV BOLUS
INTRAVENOUS | Status: DC | PRN
  Administered 2021-10-24: 130 mg via INTRAVENOUS

## 2021-10-24 MED ORDER — CEFAZOLIN IN SODIUM CHLORIDE 3-0.9 GM/100ML-% IV SOLN
INTRAVENOUS | Status: AC
  Filled 2021-10-24: qty 100

## 2021-10-24 MED ORDER — POTASSIUM CHLORIDE CRYS ER 20 MEQ PO TBCR: 20.0000 meq | EXTENDED_RELEASE_TABLET | Freq: Once | ORAL | Status: DC

## 2021-10-24 MED ORDER — INSULIN PUMP
Freq: Three times a day (TID) | SUBCUTANEOUS | Status: DC
  Administered 2021-10-24: 1 via SUBCUTANEOUS
  Administered 2021-10-26: 6 via SUBCUTANEOUS
  Filled 2021-10-24: qty 1

## 2021-10-24 MED ORDER — CHLORHEXIDINE GLUCONATE CLOTH 2 % EX PADS
6.0000 | MEDICATED_PAD | Freq: Every day | CUTANEOUS | Status: DC
  Administered 2021-10-25 – 2021-10-26 (×2): 6 via TOPICAL

## 2021-10-24 MED ORDER — ACETAMINOPHEN 325 MG RE SUPP
325.0000 mg | RECTAL | Status: DC | PRN
  Filled 2021-10-24: qty 2

## 2021-10-24 MED ORDER — CHLORHEXIDINE GLUCONATE 0.12 % MT SOLN
OROMUCOSAL | Status: AC
Start: 2021-10-24 — End: 2021-10-24
  Administered 2021-10-24: 15 mL via OROMUCOSAL
  Filled 2021-10-24: qty 15

## 2021-10-24 MED ORDER — TRAMADOL HCL 50 MG PO TABS: 50.0000 mg | ORAL_TABLET | Freq: Every evening | ORAL | Status: DC | PRN

## 2021-10-24 MED ORDER — INSULIN ASPART 100 UNIT/ML IJ SOLN: 0.0000 [IU] | Freq: Three times a day (TID) | INTRAMUSCULAR | Status: DC

## 2021-10-24 MED ORDER — EPHEDRINE SULFATE (PRESSORS) 50 MG/ML IJ SOLN
INTRAMUSCULAR | Status: DC | PRN
  Administered 2021-10-24 (×2): 10 mg via INTRAVENOUS

## 2021-10-24 MED ORDER — ACETAMINOPHEN 500 MG PO TABS
ORAL_TABLET | ORAL | Status: AC
  Administered 2021-10-24: 1000 mg via ORAL
  Filled 2021-10-24: qty 2

## 2021-10-24 MED ORDER — PREGABALIN 25 MG PO CAPS
50.0000 mg | ORAL_CAPSULE | Freq: Every day | ORAL | Status: DC
  Administered 2021-10-24: 100 mg via ORAL
  Administered 2021-10-25: 50 mg via ORAL
  Filled 2021-10-24: qty 2
  Filled 2021-10-24: qty 4

## 2021-10-24 MED ORDER — FENTANYL CITRATE (PF) 100 MCG/2ML IJ SOLN
INTRAMUSCULAR | Status: AC
  Filled 2021-10-24: qty 2

## 2021-10-24 MED ORDER — LABETALOL HCL 5 MG/ML IV SOLN: 10.0000 mg | INTRAVENOUS | Status: DC | PRN

## 2021-10-24 MED ORDER — METOPROLOL TARTRATE 5 MG/5ML IV SOLN: 2.0000 mg | INTRAVENOUS | Status: DC | PRN

## 2021-10-24 MED ORDER — COLCHICINE 0.6 MG PO TABS
0.6000 mg | ORAL_TABLET | Freq: Every day | ORAL | Status: DC
  Administered 2021-10-24 – 2021-10-25 (×2): 0.6 mg via ORAL
  Filled 2021-10-24 (×2): qty 1

## 2021-10-24 MED ORDER — METANX 3-90.314-2-35 MG PO CAPS: 1.0000 | ORAL_CAPSULE | Freq: Two times a day (BID) | ORAL | Status: DC

## 2021-10-24 MED ORDER — PHENYLEPHRINE 80 MCG/ML (10ML) SYRINGE FOR IV PUSH (FOR BLOOD PRESSURE SUPPORT)
PREFILLED_SYRINGE | INTRAVENOUS | Status: DC | PRN
  Administered 2021-10-24 (×3): 80 ug via INTRAVENOUS
  Administered 2021-10-24: 160 ug via INTRAVENOUS
  Administered 2021-10-24 (×2): 80 ug via INTRAVENOUS
  Administered 2021-10-24: 120 ug via INTRAVENOUS
  Administered 2021-10-24: 80 ug via INTRAVENOUS

## 2021-10-24 MED ORDER — PRAVASTATIN SODIUM 40 MG PO TABS
40.0000 mg | ORAL_TABLET | Freq: Every day | ORAL | Status: DC
  Administered 2021-10-24 – 2021-10-25 (×2): 40 mg via ORAL
  Filled 2021-10-24 (×2): qty 1

## 2021-10-24 MED ORDER — LATANOPROST 0.005 % OP SOLN
1.0000 [drp] | Freq: Every day | OPHTHALMIC | Status: DC
  Administered 2021-10-24 – 2021-10-25 (×2): 1 [drp] via OPHTHALMIC
  Filled 2021-10-24: qty 2.5

## 2021-10-24 MED ORDER — ALUM & MAG HYDROXIDE-SIMETH 200-200-20 MG/5ML PO SUSP: 15.0000 mL | ORAL | Status: DC | PRN

## 2021-10-24 MED ORDER — POLYETHYLENE GLYCOL 3350 17 G PO PACK: 17.0000 g | PACK | Freq: Every day | ORAL | Status: DC | PRN

## 2021-10-24 MED ORDER — SODIUM CHLORIDE 0.9 % IV SOLN: INTRAVENOUS | Status: DC

## 2021-10-24 MED ORDER — CEFAZOLIN SODIUM-DEXTROSE 2-4 GM/100ML-% IV SOLN
2.0000 g | Freq: Three times a day (TID) | INTRAVENOUS | Status: AC
  Administered 2021-10-24 (×2): 2 g via INTRAVENOUS
  Filled 2021-10-24 (×2): qty 100

## 2021-10-24 MED ORDER — ESMOLOL HCL 100 MG/10ML IV SOLN
INTRAVENOUS | Status: AC
  Filled 2021-10-24: qty 10

## 2021-10-24 MED ORDER — FERROUS SULFATE 325 (65 FE) MG PO TABS
325.0000 mg | ORAL_TABLET | Freq: Every day | ORAL | Status: DC
  Administered 2021-10-25 – 2021-10-26 (×2): 325 mg via ORAL
  Filled 2021-10-24 (×2): qty 1

## 2021-10-24 MED ORDER — MORPHINE SULFATE (PF) 2 MG/ML IV SOLN: 2.0000 mg | INTRAVENOUS | Status: DC | PRN

## 2021-10-24 MED ORDER — L-METHYLFOLATE-B6-B12 3-35-2 MG PO TABS
1.0000 | ORAL_TABLET | Freq: Every day | ORAL | Status: DC
  Administered 2021-10-24 – 2021-10-26 (×3): 1 via ORAL
  Filled 2021-10-24 (×4): qty 1

## 2021-10-24 MED ORDER — HYDRALAZINE HCL 20 MG/ML IJ SOLN: 5.0000 mg | INTRAMUSCULAR | Status: DC | PRN

## 2021-10-24 MED ORDER — LOSARTAN POTASSIUM 50 MG PO TABS
100.0000 mg | ORAL_TABLET | Freq: Every day | ORAL | Status: DC
  Administered 2021-10-24 – 2021-10-25 (×2): 100 mg via ORAL
  Filled 2021-10-24 (×2): qty 2

## 2021-10-24 MED ORDER — TRIAMTERENE-HCTZ 37.5-25 MG PO TABS
1.0000 | ORAL_TABLET | Freq: Every day | ORAL | Status: DC
  Administered 2021-10-24 – 2021-10-25 (×2): 1 via ORAL
  Filled 2021-10-24 (×4): qty 1

## 2021-10-24 MED ORDER — MUPIROCIN 2 % EX OINT
1.0000 | TOPICAL_OINTMENT | Freq: Two times a day (BID) | CUTANEOUS | Status: DC
Start: 2021-10-24 — End: 2021-10-26
  Administered 2021-10-24 – 2021-10-26 (×5): 1 via NASAL
  Filled 2021-10-24 (×2): qty 22

## 2021-10-24 MED ORDER — ACETAMINOPHEN 325 MG PO TABS
325.0000 mg | ORAL_TABLET | ORAL | Status: DC | PRN
  Administered 2021-10-24 – 2021-10-25 (×2): 650 mg via ORAL
  Filled 2021-10-24 (×2): qty 2

## 2021-10-24 MED ORDER — ALBUTEROL SULFATE HFA 108 (90 BASE) MCG/ACT IN AERS
INHALATION_SPRAY | RESPIRATORY_TRACT | Status: AC
  Filled 2021-10-24: qty 6.7

## 2021-10-24 MED ORDER — ONDANSETRON HCL 4 MG/2ML IJ SOLN: 4.0000 mg | Freq: Four times a day (QID) | INTRAMUSCULAR | Status: DC | PRN

## 2021-10-24 MED ORDER — PANTOPRAZOLE SODIUM 40 MG PO TBEC
40.0000 mg | DELAYED_RELEASE_TABLET | Freq: Every day | ORAL | Status: DC
  Administered 2021-10-25 – 2021-10-26 (×2): 40 mg via ORAL
  Filled 2021-10-24 (×2): qty 1

## 2021-10-24 MED ORDER — CLOPIDOGREL BISULFATE 75 MG PO TABS
75.0000 mg | ORAL_TABLET | Freq: Every day | ORAL | Status: DC
Start: 2021-10-25 — End: 2021-10-26
  Administered 2021-10-25 – 2021-10-26 (×2): 75 mg via ORAL
  Filled 2021-10-24 (×2): qty 1

## 2021-10-24 MED ORDER — PHENYLEPHRINE HCL (PRESSORS) 10 MG/ML IV SOLN
INTRAVENOUS | Status: AC
  Filled 2021-10-24: qty 1

## 2021-10-24 MED ORDER — FENTANYL CITRATE (PF) 250 MCG/5ML IJ SOLN
INTRAMUSCULAR | Status: AC
  Filled 2021-10-24: qty 5

## 2021-10-24 MED ORDER — MIDAZOLAM HCL 2 MG/2ML IJ SOLN
INTRAMUSCULAR | Status: DC | PRN
  Administered 2021-10-24: 2 mg via INTRAVENOUS

## 2021-10-24 MED ORDER — GUAIFENESIN-DM 100-10 MG/5ML PO SYRP: 15.0000 mL | ORAL_SOLUTION | ORAL | Status: DC | PRN

## 2021-10-24 MED ORDER — OXYCODONE-ACETAMINOPHEN 5-325 MG PO TABS
1.0000 | ORAL_TABLET | ORAL | Status: DC | PRN
  Administered 2021-10-24 – 2021-10-25 (×3): 2 via ORAL
  Filled 2021-10-24 (×3): qty 2

## 2021-10-24 MED ORDER — LIDOCAINE 2% (20 MG/ML) 5 ML SYRINGE
INTRAMUSCULAR | Status: DC | PRN
  Administered 2021-10-24: 100 mg via INTRAVENOUS

## 2021-10-24 MED ORDER — FENTANYL CITRATE (PF) 100 MCG/2ML IJ SOLN
INTRAMUSCULAR | Status: DC | PRN
  Administered 2021-10-24: 100 ug via INTRAVENOUS
  Administered 2021-10-24 (×2): 50 ug via INTRAVENOUS

## 2021-10-24 MED ORDER — ONDANSETRON HCL 4 MG/2ML IJ SOLN: 4.0000 mg | Freq: Once | INTRAMUSCULAR | Status: DC | PRN

## 2021-10-24 MED ORDER — LACTATED RINGERS IV SOLN: INTRAVENOUS | Status: DC

## 2021-10-24 MED ORDER — ONDANSETRON HCL 4 MG/2ML IJ SOLN
INTRAMUSCULAR | Status: DC | PRN
Start: 2021-10-24 — End: 2021-10-24
  Administered 2021-10-24: 4 mg via INTRAVENOUS

## 2021-10-24 MED ORDER — DEXAMETHASONE SODIUM PHOSPHATE 10 MG/ML IJ SOLN
INTRAMUSCULAR | Status: DC | PRN
  Administered 2021-10-24: 4 mg via INTRAVENOUS

## 2021-10-24 SURGICAL SUPPLY — 43 items
BAG COUNTER SPONGE SURGICOUNT (BAG) ×2 IMPLANT
BAG ISOLATION DRAPE 18X18 (DRAPES) IMPLANT
BNDG COHESIVE 4X5 TAN ST LF (GAUZE/BANDAGES/DRESSINGS) ×1 IMPLANT
BNDG ELASTIC 4X5.8 VLCR STR LF (GAUZE/BANDAGES/DRESSINGS) ×1 IMPLANT
BNDG ELASTIC 6X10 VLCR STRL LF (GAUZE/BANDAGES/DRESSINGS) ×1 IMPLANT
BNDG GAUZE ELAST 4 BULKY (GAUZE/BANDAGES/DRESSINGS) ×1 IMPLANT
CANISTER SUCT 3000ML PPV (MISCELLANEOUS) ×3 IMPLANT
CANISTER WOUNDNEG PRESSURE 500 (CANNISTER) ×1 IMPLANT
COVER SURGICAL LIGHT HANDLE (MISCELLANEOUS) ×2 IMPLANT
DRAPE EXTREMITY T 121X128X90 (DISPOSABLE) ×1 IMPLANT
DRAPE HALF SHEET 40X57 (DRAPES) ×1 IMPLANT
DRAPE ISOLATION BAG 18X18 (DRAPES) ×2
DRAPE ORTHO SPLIT 77X108 STRL (DRAPES) ×4
DRAPE SURG ORHT 6 SPLT 77X108 (DRAPES) IMPLANT
DRAPE U-SHAPE 76X120 STRL (DRAPES) IMPLANT
DRSG VAC ATS LRG SENSATRAC (GAUZE/BANDAGES/DRESSINGS) ×1 IMPLANT
DRSG VAC ATS MED SENSATRAC (GAUZE/BANDAGES/DRESSINGS) IMPLANT
ELECT REM PT RETURN 9FT ADLT (ELECTROSURGICAL) ×2
ELECTRODE REM PT RTRN 9FT ADLT (ELECTROSURGICAL) ×1 IMPLANT
GLOVE BIO SURGEON STRL SZ7.5 (GLOVE) ×2 IMPLANT
GOWN STRL REUS W/ TWL LRG LVL3 (GOWN DISPOSABLE) ×3 IMPLANT
GOWN STRL REUS W/ TWL XL LVL3 (GOWN DISPOSABLE) ×1 IMPLANT
GOWN STRL REUS W/TWL LRG LVL3 (GOWN DISPOSABLE) ×2
GOWN STRL REUS W/TWL XL LVL3 (GOWN DISPOSABLE) ×2
HANDPIECE INTERPULSE COAX TIP (DISPOSABLE)
IV NS IRRIG 3000ML ARTHROMATIC (IV SOLUTION) ×1 IMPLANT
KIT BASIN OR (CUSTOM PROCEDURE TRAY) ×2 IMPLANT
KIT TURNOVER KIT B (KITS) ×2 IMPLANT
NS IRRIG 1000ML POUR BTL (IV SOLUTION) ×2 IMPLANT
PACK GENERAL/GYN (CUSTOM PROCEDURE TRAY) ×2 IMPLANT
PACK UNIVERSAL I (CUSTOM PROCEDURE TRAY) ×1 IMPLANT
PAD ARMBOARD 7.5X6 YLW CONV (MISCELLANEOUS) ×4 IMPLANT
PAD NEG PRESSURE SENSATRAC (MISCELLANEOUS) ×2 IMPLANT
PULSAVAC PLUS IRRIG FAN TIP (DISPOSABLE)
SET HNDPC FAN SPRY TIP SCT (DISPOSABLE) IMPLANT
SUT ETHILON 3 0 PS 1 (SUTURE) IMPLANT
SUT VIC AB 2-0 CTX 36 (SUTURE) IMPLANT
SUT VIC AB 3-0 SH 27 (SUTURE)
SUT VIC AB 3-0 SH 27X BRD (SUTURE) IMPLANT
SUT VICRYL 4-0 PS2 18IN ABS (SUTURE) IMPLANT
TIP FAN IRRIG PULSAVAC PLUS (DISPOSABLE) IMPLANT
TOWEL GREEN STERILE (TOWEL DISPOSABLE) ×2 IMPLANT
WATER STERILE IRR 1000ML POUR (IV SOLUTION) ×2 IMPLANT

## 2021-10-24 NOTE — Anesthesia Procedure Notes (Signed)
Procedure Name: LMA Insertion ?Date/Time: 10/24/2021 7:45 AM ?Performed by: Leonor Liv, CRNA ?Pre-anesthesia Checklist: Patient identified, Emergency Drugs available, Suction available and Patient being monitored ?Patient Re-evaluated:Patient Re-evaluated prior to induction ?Oxygen Delivery Method: Circle System Utilized ?Preoxygenation: Pre-oxygenation with 100% oxygen ?Induction Type: IV induction ?Ventilation: Mask ventilation without difficulty ?LMA: LMA inserted ?LMA Size: 5.0 ?Number of attempts: 1 ?Placement Confirmation: positive ETCO2 ?Tube secured with: Tape ?Dental Injury: Teeth and Oropharynx as per pre-operative assessment  ? ? ? ? ?

## 2021-10-24 NOTE — Interval H&P Note (Signed)
History and Physical Interval Note: ? ?10/24/2021 ?7:24 AM ? ?[name]  has presented today for surgery, with the diagnosis of Open wound of left lower leg with complication.  The various methods of treatment have been discussed with the patient and family. After consideration of risks, benefits and other options for treatment, the patient has consented to  Procedure(s): ?LEFT LEG IRRIGATION AND DEBRIDEMENT (Left) ?APPLICATION OF WOUND VAC (Left) as a surgical intervention.  The patient's history has been reviewed, patient examined, no change in status, stable for surgery.  I have reviewed the patient's chart and labs.  Questions were answered to the patient's satisfaction.   ? ? ?[name] ? ? ?

## 2021-10-24 NOTE — Op Note (Signed)
? ? ?Patient name: [name] MRN: [id number] DOB: 02/15/1951 Sex: male ? ?10/24/2021 ?Pre-operative Diagnosis: left leg wound ?Post-operative diagnosis:  Same ?Surgeon:  Eda Paschal. Donzetta Matters, MD ?Assistant: Leontine Locket, PA ?Procedure Performed: ?1.  I + D left leg skin and soft tissue with sharp excisisional debridement to 9 cm deep, 7 cm wide and 16 cm long with excision of Viabahn stent ?2.  Wound VAC placement left leg wound ?3.  Unna boot placement left lower extremity ? ?Indications: 71 year old male with a history of left SFA stenting with placement of Innova self-expanding stent 6 x 120 and then a Viabahn stent 5 x 150.  The stent subsequently occluded he required femoral to anterior tibial artery bypass with vein.  He has had multiple wound issues since that time most recently has undergone debridement from the lateral aspect of the leg but has been unable to get healing.  He is now indicated for further I&D and evaluation. ? ?Findings: The wound tracked far up to the thigh we did open this up.  I can actually not identify the bypass other than by palpation.  Identified a Viabahn stent which was removed entirely.  This did have some of the Innova self-expanding stent attached to it but I could not identify any further Innova.  Given that the Jerilee [place] is likely occluded and removing it would lead to an open SFA I elected for no further intervention of the Innova and hopefully we can get wound healing by placing a wound VAC. ? ?If we cannot get healing we will need to consider medial approach to the bypass and excision of the SFA and the Innova stent. ?  ?Procedure:  The patient was identified in the holding area and taken to the operating room where is placed supine on the operative when general anesthesia was induced.  He was sterilely prepped draped in the left lower extremity usual fashion, antibiotics were administered and timeout was called.  We began by opening the sinus tract on the lateral leg ultimately  opened this up for 16 cm using cautery.  Fibrinous exudate was debrided as was some of the capsule rind of the soft tissue and the skin with cautery.  I used a curette to remove some of the heaped up fibrinous exudate deep.  I palpated very deep there was a nerve and some tenderness to attachments that I was able to preserve but some muscle did need to be divided.  Up deep in the wound I was able to identify a Viabahn stent and this was removed and there was some of the previous Innova stent attached but I could feel no further Innova stent there.  I reviewed his CT scan this demonstrated an occluded Innova stent extending up into the SFA and I was concerned that removing the Innova entirely would cause bleeding and this will need to be done from a medial approach which we did not have prepped out today.  As I could palpate no further stent I elected to copiously irrigate the wound and obtain hemostasis and remove all fibrinous exudate including the capsule deep in the wound as well as some of the skin and soft tissue with cautery.  This was a total of 16 x 7 cm and tracking 9 cm deep.  I then fashioned a wound VAC and placed this to suction 125 mmHg.  An 3 level dry Unna boot was then placed from the foot all the way up to the level of the wound  VAC.  He tolerated procedure well without any complication.  All counts were correct at completion.  He was awakened from anesthesia and transferred recovery in stable condition ? ?EBL: 25cc ? ? ?Toni Demo C. Donzetta Matters, MD ?[facility] ?Office: [phone] ?Pager: [phone] ? ? ?

## 2021-10-24 NOTE — Transfer of Care (Signed)
Immediate Anesthesia Transfer of Care Note ? ?Patient: [name] ? ?Procedure(s) Performed: LEFT LEG IRRIGATION AND DEBRIDEMENT (Left: Leg Lower) ?APPLICATION OF WOUND VAC (Left: Leg Lower) ? ?Patient Location: PACU ? ?Anesthesia Type:General ? ?Level of Consciousness: drowsy ? ?Airway & Oxygen Therapy: Patient Spontanous Breathing and Patient connected to nasal cannula oxygen ? ?Post-op Assessment: Report given to RN and Post -op Vital signs reviewed and stable ? ?Post vital signs: Reviewed and stable ? ?Last Vitals:  ?Vitals Value Taken Time  ?BP 119/78 10/24/21 0917  ?Temp    ?Pulse 61 10/24/21 0919  ?Resp 14 10/24/21 0919  ?SpO2 94 % 10/24/21 0919  ?Vitals shown include unvalidated device data. ? ?Last Pain:  ?Vitals:  ? 10/24/21 0615  ?TempSrc: Oral  ?PainSc: 4   ?   ? ?  ? ?Complications: No notable events documented. ?

## 2021-10-24 NOTE — OR Nursing (Signed)
Stent removal by Dr. Donzetta Matters in [facility] 9 at [phone].  ?

## 2021-10-24 NOTE — Progress Notes (Signed)
Pt arrived to unit from PACU VSS, A/O x 4,  CCMD called ,CHG given, pt oriented to unit, ?Will continue to monitor.  ? ?Phoebe Sharps, RN ? ? ? ? 10/24/21 1344  ?Vitals  ?Temp 97.6 ?F (36.4 ?C)  ?Temp Source Oral  ?BP 132/76  ?MAP (mmHg) 91  ?BP Location Right Arm  ?BP Method Automatic  ?Patient Position (if appropriate) Lying  ?Pulse Rate 62  ?Pulse Rate Source Monitor  ?ECG Heart Rate 64  ?Resp 20  ?Level of Consciousness  ?Level of Consciousness Alert  ?Oxygen Therapy  ?SpO2 99 %  ?O2 Device Room Air  ?O2 Flow Rate (L/min) 0 L/min  ?Pain Assessment  ?Pain Scale 0-10  ?Pain Score 0  ?MEWS Score  ?MEWS Temp 0  ?MEWS Systolic 0  ?MEWS Pulse 0  ?MEWS RR 0  ?MEWS LOC 0  ?MEWS Score 0  ?MEWS Score Color Green  ? ? ? ?

## 2021-10-24 NOTE — Progress Notes (Addendum)
Inpatient Diabetes Program Recommendations ? ?AACE/ADA: New Consensus Statement on Inpatient Glycemic Control (2015) ? ?Target Ranges:  Prepandial:   less than 140 mg/dL ?     Peak postprandial:   less than 180 mg/dL (1-2 hours) ?     Critically ill patients:  140 - 180 mg/dL  ? ? Latest Reference Range & Units 10/24/21 06:16 10/24/21 09:18 10/24/21 13:56  ?Glucose-Capillary 70 - 99 mg/dL 220 (H) 220 (H) 227 (H)  ? ? ? ?Admit for L LE I&D + Wound Vac application ? ?History: Type 2 DM ? ?Home DM Meds: Omni Pod Insulin Pump ?       Pt reports, 0.8 units/hr Basal Rate ?       Dexcom CGM ? ?Current Orders: Insulin Pump TID AC + HS + 2am ? ? ? ?Endocrinologist: Rayetta Pigg, NP with [facility] ?Last seen in office 08/01/2021 ?Was using the VGO 30 at that visit--Was bolusing 8 units with meals ?Advised to consider switching to the [employer] ?Looks like [employer] Dash Pump started Late March ? ?--Insulin Pump Settings-- ? ?Basal Rates:0.8 units/hr ?Total Basal Insulin per 24 hours period= 19.2 units ? ?Carbohydrate Ratio: 1 unit for every 11 Grams of Carbohydrates ? ?Correction/Sensitivity Factor: 1 unit for every 43 mg/dl above Target CBG ? ?Target CBG: 120 mg/dl ? ? ? ? ?Addendum 2pm--Met w/ pt at bedside.  Pt A&O and able to manage his insulin pump independently.  Has extra pump supplies at bedside.  Pt due to change pod and Insulin around 11pm tonight when pod expires.  We reviewed his current Insulin pump settings (see above).  Pt concerned that his CBGs are >250 on his CGM Dexcom.  We talked about how he was given Decadron earlier (explained what Decadron is and why it can raise CBGs).  Pt stated he started a temporary basal rate at 1.56 units/hr which will expire and revert back to normal basal rate around 8pm tonight (0.8 unit/hr).  Pt told me he has follow up appt with ENDO on 11/01/2021.  Discussed w/ pt that we will check his fingerstick CBGs with the hospital meter as ordered and that he  may also use his CGM to assess CBGs.  Pt comfortable bolusing self and comfortable changing out pod tonight.   ? ?Spoke w/ RN caring for pt and reviewed all charting responsibilities and needs for caring for pt with insulin pump.  RN knows to check CBGs with hospital meter as ordered.  Assessment completed by this RN.   ? ?Called Leontine Locket, PA with Vascular (referral placed by PA).  Got orders to place Insulin Pump orders and d/c the Novolog SSI.   ? ? ? ?--Will follow patient during hospitalization-- ? ?Wyn Quaker RN, MSN, CDE ?Diabetes Coordinator ?Inpatient Glycemic Control Team ?Team Pager: [phone] (8a-5p) ? ?

## 2021-10-25 ENCOUNTER — Encounter (HOSPITAL_COMMUNITY): Payer: Self-pay | Admitting: Vascular Surgery

## 2021-10-25 LAB — CBC
HCT: 33.2 % — ABNORMAL LOW (ref 39.0–52.0)
Hemoglobin: 10.7 g/dL — ABNORMAL LOW (ref 13.0–17.0)
MCH: 25.2 pg — ABNORMAL LOW (ref 26.0–34.0)
MCHC: 32.2 g/dL (ref 30.0–36.0)
MCV: 78.1 fL — ABNORMAL LOW (ref 80.0–100.0)
Platelets: 192 10*3/uL (ref 150–400)
RBC: 4.25 MIL/uL (ref 4.22–5.81)
RDW: 17 % — ABNORMAL HIGH (ref 11.5–15.5)
WBC: 8.1 10*3/uL (ref 4.0–10.5)
nRBC: 0 % (ref 0.0–0.2)

## 2021-10-25 LAB — BASIC METABOLIC PANEL
Anion gap: 10 (ref 5–15)
BUN: 26 mg/dL — ABNORMAL HIGH (ref 8–23)
CO2: 23 mmol/L (ref 22–32)
Calcium: 8.9 mg/dL (ref 8.9–10.3)
Chloride: 100 mmol/L (ref 98–111)
Creatinine, Ser: 1.77 mg/dL — ABNORMAL HIGH (ref 0.61–1.24)
GFR, Estimated: 41 mL/min — ABNORMAL LOW (ref >60)
Glucose, Bld: 329 mg/dL — ABNORMAL HIGH (ref 70–99)
Potassium: 4.5 mmol/L (ref 3.5–5.1)
Sodium: 133 mmol/L — ABNORMAL LOW (ref 135–145)

## 2021-10-25 LAB — GLUCOSE, CAPILLARY
Glucose-Capillary: 293 mg/dL — ABNORMAL HIGH (ref 70–99)
Glucose-Capillary: 275 mg/dL — ABNORMAL HIGH (ref 70–99)
Glucose-Capillary: 299 mg/dL — ABNORMAL HIGH (ref 70–99)
Glucose-Capillary: 331 mg/dL — ABNORMAL HIGH (ref 70–99)

## 2021-10-25 NOTE — Progress Notes (Addendum)
?  Progress Note ? ? ? ?10/25/2021 ?8:04 AM ?1 Day Post-Op ? ?Subjective:  no complaints this morning ? ? ?Vitals:  ? 10/25/21 0000 10/25/21 0400  ?BP:  117/63  ?Pulse:  60  ?Resp:  17  ?Temp: 97.9 ?F (36.6 ?C) 98 ?F (36.7 ?C)  ?SpO2:  97%  ? ?Physical Exam: ?Lungs:  non labored ?Incisions:  wound vac left leg with good seal ?Neurologic: A&O ? ?CBC ?   ?Component Value Date/Time  ? WBC 8.1 10/25/2021 0223  ? RBC 4.25 10/25/2021 0223  ? HGB 10.7 (L) 10/25/2021 0223  ? HCT 33.2 (L) 10/25/2021 0223  ? PLT 192 10/25/2021 0223  ? MCV 78.1 (L) 10/25/2021 0223  ? MCH 25.2 (L) 10/25/2021 0223  ? MCHC 32.2 10/25/2021 0223  ? RDW 17.0 (H) 10/25/2021 0223  ? LYMPHSABS 1.6 05/30/2021 1223  ? MONOABS 0.9 05/30/2021 1223  ? EOSABS 0.5 05/30/2021 1223  ? BASOSABS 0.0 05/30/2021 1223  ? ? ?BMET ?   ?Component Value Date/Time  ? NA 133 (L) 10/25/2021 0223  ? NA 138 07/07/2021 0000  ? K 4.5 10/25/2021 0223  ? CL 100 10/25/2021 0223  ? CO2 23 10/25/2021 0223  ? GLUCOSE 329 (H) 10/25/2021 0223  ? BUN 26 (H) 10/25/2021 0223  ? BUN 24 05/31/2021 1222  ? CREATININE 1.77 (H) 10/25/2021 0223  ? CALCIUM 8.9 10/25/2021 0223  ? GFRNONAA 41 (L) 10/25/2021 0223  ? GFRAA >60 10/29/2019 0226  ? ? ?INR ?   ?Component Value Date/Time  ? INR 1.9 (H) 10/23/2019 0618  ? ? ? ?Intake/Output Summary (Last 24 hours) at 10/25/2021 0804 ?Last data filed at 10/25/2021 0500 ?Gross per 24 hour  ?Intake 1404.31 ml  ?Output 2314 ml  ?Net -909.69 ml  ? ? ? ?Assessment/Plan:  71 y.o. male is s/p wound debridement 1 Day Post-Op  ? ?Wound vac in place LLE with good seal ?Vac change tomorrow and likely discharge home ?Patient has already arranged a wound care appt for Monday 5/8 ? ? ?Dagoberto Ligas, PA-C ?[facility] ?[phone] ?10/25/2021 ?8:04 AM ? ?I have seen and evaluated the patient. I agree with the PA note as documented above.  Postop day 1 status post I&D of left leg wound with debridement and VAC placement also had placement of Unna boot.  Plan  VAC change tomorrow.  Patient is comfortable.  Likely discharge tomorrow with VAC to the left leg wound and Unna boot and follow-up in wound clinic ? ?Marty Heck, MD ?[facility] ?Office: [phone] ? ? ?

## 2021-10-25 NOTE — Progress Notes (Signed)
Mobility Specialist Progress Note ? ? 10/25/21 1235  ?Mobility  ?Activity Transferred from bed to chair  ?Level of Assistance Minimal assist, patient does 75% or more  ?Assistive Device  ?(HHA)  ?Distance Ambulated (ft) 2 ft  ?Activity Response Tolerated well  ?$Mobility charge 1 Mobility  ? ?Post Mobility: 69 HR, 143/80 BP, 100% SpO2 ? ?Received in bed having no complaints. Pt requesting no assistance throughout tx and declining a RW. SPT to chair w/ slight LOB when backing up to chair, corrected w/ minA. Left in chair w/ chair alarm on and call bell in reach. ? ?Holland Falling ?Mobility Specialist ?Phone Number [phone] ? ?

## 2021-10-25 NOTE — Progress Notes (Signed)
Mobility Specialist Progress Note ? ? 10/25/21 1845  ?Mobility  ?Activity Transferred from chair to bed  ?Level of Assistance Contact guard assist, steadying assist  ?Assistive Device None  ?Distance Ambulated (ft) 2 ft  ?Activity Response Tolerated well  ?$Mobility charge 1 Mobility  ? ?Received pt in chair requesting to get back to bed. No physical assist needed throughout but min cues needed for direction. SPT from chair to EOB and left supine w/ call bell in reach and needs met.  ? ?Holland Falling ?Mobility Specialist ?Phone Number [phone] ? ?

## 2021-10-25 NOTE — TOC Initial Note (Addendum)
Transition of Care (TOC) - Initial/Assessment Note  ?[name] Therapist, sports, BSN ?Transitions of Care ?Unit 4E- RN Case Manager ?See Treatment Team for direct phone #  ? ? ?Patient Details  ?Name: [name] ?MRN: [id number] ?Date of Birth: 18-Feb-1951 ? ?Transition of Care (TOC) CM/SW Contact:    ?Dahlia Client, Romeo Rabon, RN ?Phone Number: ?10/25/2021, 12:44 PM ? ?Clinical Narrative:                 ?Pt s/p I&D with wound VAC change, from home.  ?CM spoke with pt at bedside- per conversation pt shares he has needed DME including RW and wheelchair at home, still drives. Pt reports he goes to the [place] clinic for [facility] changes Mon/ Thur. Has home VAC through [facility]- home VAC machine at bedside in closet along with extra supplies.  ?Pt voiced that he plans to continue current plan with wound care at the clinic- next scheduled visit is for [facility]- 5/8. CM notified Olivia Mackie with [employer] for continued home VAC needs.  ?Update- 1500- received msg from [place] w/ [employer]- pt's home [facility] Rx is about to expire- needs to be updated- requesting new Rx be signed here- CM has placed [facility] home VAC order Rx on chart for signature- request to be singed prior to discharge and CM will fax on to [facility]. CM to f/u in am ?1620- new [facility] home Rx form has been signed and faxed back to [facility] ? ?Family to transport home.  ? ?TOC to follow for any additional transition needs for home.  ? ?Expected Discharge Plan: Home/Self Care ?Barriers to Discharge: Continued Medical Work up ? ? ?Patient Goals and CMS Choice ?Patient states their goals for this hospitalization and ongoing recovery are:: return home ?  ?Choice offered to / list presented to : NA ? ?Expected Discharge Plan and Services ?Expected Discharge Plan: Home/Self Care ?  ?Discharge Planning Services: CM Consult ?Post Acute Care Choice: NA ?Living arrangements for the past 2 months: [place] ?                ?DME Arranged: Vac ?DME Agency: [facility] (Has home [facility] VAC already- (home VAC at bedside)) ?  ?  ?  ?HH  Arranged: NA (pt goes to [facility] wound [facility] for  wound care M/TH) ?[place] Agency: NA ?  ?  ?  ? ?Prior Living Arrangements/Services ?Living arrangements for the past 2 months: [place] ?Lives with:: Spouse ?Patient language and need for interpreter reviewed:: Yes ?Do you feel safe going back to the place where you live?: Yes      ?Need for Family Participation in Patient Care: Yes (Comment) ?Care giver support system in place?: Yes (comment) ?Current home services: DME ?Criminal Activity/Legal Involvement Pertinent to Current Situation/Hospitalization: No - Comment as needed ? ?Activities of Daily Living ?Home Assistive Devices/Equipment: Eyeglasses, Wound Vac, Insulin Pump, CBG Meter ?ADL Screening (condition at time of admission) ?Patient's cognitive ability adequate to safely complete daily activities?: Yes ?Is the patient deaf or have difficulty hearing?: No ?Does the patient have difficulty seeing, even when wearing glasses/contacts?: No ?Does the patient have difficulty concentrating, remembering, or making decisions?: No ?Patient able to express need for assistance with ADLs?: Yes ?Does the patient have difficulty dressing or bathing?: No ?Independently performs ADLs?: Yes (appropriate for developmental age) ?Does the patient have difficulty walking or climbing stairs?: Yes ?Weakness of Legs: None ?Weakness of Arms/Hands: None ? ?Permission Sought/Granted ?  ?  ?   ?   ?   ?   ? ?  Emotional Assessment ?Appearance:: Appears stated age ?Attitude/Demeanor/Rapport: Engaged ?Affect (typically observed): Accepting, Appropriate, Pleasant ?Orientation: : Oriented to Self, Oriented to Place, Oriented to  Time, Oriented to Situation ?Alcohol / Substance Use: Not Applicable ?Psych Involvement: No (comment) ? ?Admission diagnosis:  Non-healing wound of lower extremity [S81.809A] ?Patient Active Problem List  ? Diagnosis Date Noted  ? Non-healing wound of lower extremity 10/24/2021  ? PAD (peripheral artery disease)  ([place]) 06/06/2021  ? Staphylococcus aureus infection   ? Hemorrhage   ? Ischemic leg 10/21/2019  ? Wound infection after surgery 10/21/2019  ? Type 2 diabetes mellitus with hyperglycemia, with long-term current use of insulin ([place])   ? Thrombocytosis   ? Chronic pain of left knee   ? Primary osteoarthritis of right knee   ? Effusion of lower leg joint   ? Moderate episode of recurrent major depressive disorder ([place])   ? Hypoalbuminemia due to protein-calorie malnutrition ([place])   ? Debility 12/26/2018  ? Acute on chronic renal failure ([place]) 12/19/2018  ? Hyponatremia 12/19/2018  ? History of DVT (deep vein thrombosis) 12/19/2018  ? Non-healing surgical wound 09/11/2018  ? CKD (chronic kidney disease) stage 3, GFR 30-59 ml/min 06/28/2018  ? PVD (peripheral vascular disease) ([place]) 06/28/2018  ? Diabetic foot ulcer ([place]) 06/27/2018  ? Anemia 06/27/2018  ? Benign essential HTN   ? Diabetes mellitus type 2 in obese [facility])   ? Acute blood loss anemia   ? Tachypnea   ? Leukocytosis   ? Critical limb ischemia with history of revascularization of same extremity ([place]) 04/28/2018  ? Critical lower limb ischemia ([place]) 04/28/2018  ? Claudication in peripheral vascular disease ([place]) 08/04/2017  ? IDDM (insulin dependent diabetes mellitus) 12/02/2012  ? HTN (hypertension) 12/02/2012  ? ?PCP:  [name], MD ?Pharmacy:   ?[facility] [address] [facility] ?[place] ?[place] [address] ?Phone: [phone] Fax: [phone] ? ?[facility] [place] [place] [facility] ?[address] [facility] ?[place] [address] ?Phone: [phone] Fax: [phone] ? ?[facility] [address] ?[address] ?[place] [address] ?Phone: [phone] Fax: [phone] ? ?[facility] (New Address) - [place], [place] AT Previously:  [place], [place] ?[place] ?[address] ?[place] [address] ?Phone: [phone] Fax: [phone] ? ? ? ? ?Social Determinants of Health (SDOH) Interventions ?  ? ?Readmission Risk Interventions ?   ? View : No data to display.  ?  ?  ?  ? ? ? ?

## 2021-10-25 NOTE — Anesthesia Postprocedure Evaluation (Signed)
Anesthesia Post Note ? ?Patient: CALVEN GILKES ? ?Procedure(s) Performed: LEFT LEG IRRIGATION AND DEBRIDEMENT (Left: Leg Lower) ?APPLICATION OF WOUND VAC (Left: Leg Lower) ? ?  ? ?Patient location during evaluation: PACU ?Anesthesia Type: General ?Level of consciousness: awake and alert ?Pain management: pain level controlled ?Vital Signs Assessment: post-procedure vital signs reviewed and stable ?Respiratory status: spontaneous breathing, nonlabored ventilation, respiratory function stable and patient connected to nasal cannula oxygen ?Cardiovascular status: blood pressure returned to baseline and stable ?Postop Assessment: no apparent nausea or vomiting ?Anesthetic complications: no ? ? ?No notable events documented. ? ?Last Vitals:  ?Vitals:  ? 10/25/21 0000 10/25/21 0400  ?BP:  117/63  ?Pulse:  60  ?Resp:  17  ?Temp: 36.6 ?C 36.7 ?C  ?SpO2:  97%  ?  ?Last Pain:  ?Vitals:  ? 10/25/21 0400  ?TempSrc: Oral  ?PainSc: 2   ? ? ?  ?  ?  ?  ?  ?  ? ?[place] ? ? ? ? ?

## 2021-10-26 LAB — GLUCOSE, CAPILLARY: Glucose-Capillary: 164 mg/dL — ABNORMAL HIGH (ref 70–99)

## 2021-10-26 MED ORDER — FERROUS SULFATE 325 (65 FE) MG PO TABS
325.0000 mg | ORAL_TABLET | Freq: Every day | ORAL | Status: AC
Start: 2021-10-26 — End: ?

## 2021-10-26 MED ORDER — TRAMADOL HCL 50 MG PO TABS: 50.0000 mg | ORAL_TABLET | Freq: Two times a day (BID) | ORAL | 0 refills | Status: AC | PRN

## 2021-10-26 NOTE — TOC Transition Note (Signed)
Transition of Care (TOC) - CM/SW Discharge Note ?[name] Therapist, sports, BSN ?Transitions of Care ?Unit 4E- RN Case Manager ?See Treatment Team for direct phone #  ? ? ?Patient Details  ?Name: [name] ?MRN: [id number] ?Date of Birth: 07-02-1950 ? ?Transition of Care (TOC) CM/SW Contact:  ?Dahlia Client, Romeo Rabon, RN ?Phone Number: ?10/26/2021, 9:47 AM ? ? ?Clinical Narrative:    ?Pt stable for transition home today, pt has home [facility] VAC in closet at bedside, bedside RN aware and will transition patient to home [facility] prior to discharge. Pt has appointment with wound care center on Mon for vac drsg change.  ?No further TOC needs noted, family to transport home.  ? ? ?Final next level of care: Home/Self Care ?Barriers to Discharge: Barriers Resolved ? ? ?Patient Goals and CMS Choice ?Patient states their goals for this hospitalization and ongoing recovery are:: return home ?  ?Choice offered to / list presented to : NA ? ?Discharge Placement ?  ?           ?  ? Home ?  ?  ? ?Discharge Plan and Services ?  ?Discharge Planning Services: CM Consult ?Post Acute Care Choice: NA          ?DME Arranged: Vac ?DME Agency: [facility] (Has home [facility] VAC already- (home VAC at bedside)) ?Date DME Agency Contacted: 10/26/21 ?  ?Representative spoke with at DME Agency: Olivia Mackie ?HH Arranged: NA (pt goes to [facility] wound Clinic for  wound care M/TH) ?[place] Agency: NA ?  ?  ?  ? ?Social Determinants of Health (SDOH) Interventions ?  ? ? ?Readmission Risk Interventions ? ?  10/26/2021  ?  9:47 AM  ?Readmission Risk Prevention Plan  ?Transportation Screening Complete  ?PCP or Specialist Appt within 5-7 Days Complete  ?Home Care Screening Complete  ?Medication Review (RN CM) Complete  ? ? ? ? ? ?

## 2021-10-26 NOTE — Discharge Summary (Signed)
?Discharge Summary ? ?Patient ID: ?[name] ?[id number] ?71 y.o. ?04/18/1951 ? ?Admit date: 10/24/2021 ? ?Discharge date and time: 10/26/2021 ? ?Admitting Physician: Waynetta Sandy, MD  ? ?Discharge Physician: Monica Martinez, MD ? ?Admission Diagnoses: Non-healing wound of lower extremity [S81.809A] ? ?Discharge Diagnoses: Non- healing wound of lower extremity ? ?Admission Condition: fair ? ?Discharged Condition: good ? ?Indication for Admission: 71 year old male with a history of left SFA stenting with placement of Innova self-expanding stent 6 x 120 and then a Viabahn stent 5 x 150.  The stent subsequently occluded he required femoral to anterior tibial artery bypass with vein.  He has had multiple wound issues since that time most recently has undergone debridement from the lateral aspect of the leg but has been unable to get healing.  He is now indicated for further I&D and evaluation. ? ?Hospital Course: [name] was admitted on 10/24/21 and underwent left leg I&D skin and soft tissue with sharp excisional debridement with excision of Viabhan stent, wound VAC placement and unna boot application by Dr. Donzetta Matters. He tolerated the surgery well and was taken to the PACU in stable condition.  ? ?By POD#1, he remained stable. Pain well controlled. VAC in place with good suction. Unna boot in place on left leg. Remained afebrile. Hemodynamically stable. Tolerating diet. Voiding without issues ? ?By POD#2, remained stable for discharge home. Wound VAC changed. Left leg wound well appearing. Black foam replaced and good seal obtained. Unna boot left in place. PDMP was reviewed and post operative pain medication sent to patients pharmacy. He has follow up arranged at wound care center on Monday 5/15 for wound check and unna boot change. He also will have follow up In our office in 2 weeks with Dr. Donzetta Matters.  ? ?Consults: None ? ?Treatments: antibiotics: Cefazolin, analgesia: acetaminophen and Oxycodone-Acetaminophen,  and surgery: left leg I&D skin and soft tissue with sharp excisional debridement with excision of Viabahn stent, wound VAC placement and unna boot application  ? ?Disposition: Discharge disposition: 01-Home or Self Care ? ? ? ? ? ? ?Patient Instructions:  ?Allergies as of 10/26/2021   ?No Known Allergies ?  ? ?  ?Medication List  ?  ? ?TAKE these medications   ? ?acetaminophen 650 MG CR tablet ?Commonly known as: TYLENOL ?Take 1,300 mg by mouth 2 (two) times daily as needed for pain. ?  ?clopidogrel 75 MG tablet ?Commonly known as: PLAVIX ?TAKE 1 TABLET(75 MG) BY MOUTH DAILY ?  ?Colchicine 0.6 MG Caps ?Take 0.6 mg by mouth at bedtime. ?  ?Dexcom G6 Sensor Misc ?Change sensor every 10 days to check blood glucose 4 times daily. ?  ?Dexcom G6 Transmitter Misc ?Use transmitter every 90 days, have a back up on hand ?  ?EQL MEGA SELECT MENS PO ?Take 1 tablet by mouth daily. [place] ?  ?ferrous sulfate 325 (65 FE) MG tablet ?Take 1 tablet (325 mg total) by mouth 2 (two) times daily with a meal. ?What changed: when to take this ?  ?losartan 100 MG tablet ?Commonly known as: COZAAR ?Take 100 mg by mouth daily with lunch. ?  ?Metanx 3-90.314-2-35 MG Caps ?Take 1 capsule by mouth 2 (two) times daily. ?  ?NovoLOG 100 UNIT/ML injection ?Generic drug: insulin aspart ?Inject into the skin continuous. Insulin pump ?  ?Omnipod DASH Pods (Gen 4) Misc ?Change pod every 48-72 hrs ?  ?OneTouch Ultra test strip ?Generic drug: glucose blood ?Use to check blood glucose 4 times daily. ?  ?  pravastatin 40 MG tablet ?Commonly known as: PRAVACHOL ?Take 40 mg by mouth daily with lunch. ?  ?pregabalin 50 MG capsule ?Commonly known as: LYRICA ?Take 50-100 mg by mouth at bedtime. ?  ?traMADol 50 MG tablet ?Commonly known as: ULTRAM ?Take 1 tablet (50 mg total) by mouth every 12 (twelve) hours as needed for moderate pain. ?What changed: when to take this ?  ?Travoprost (BAK Free) 0.004 % Soln ophthalmic solution ?Commonly known as: TRAVATAN ?Place 1  drop into both eyes at bedtime. ?  ?triamterene-hydrochlorothiazide 37.5-25 MG tablet ?Commonly known as: MAXZIDE-25 ?Take 1 tablet by mouth daily with lunch. ?  ? ?  ? ?Activity: activity as tolerated and no driving while on analgesics ?Diet: diabetic diet and low fat, low cholesterol diet ?Wound Care:  Wound VAC per Wound Care instructions. Left leg unna boot per wound Care ? ?Follow-up with Dr. Donzetta Matters in 2 weeks. ? ?Signed: ?Karoline Caldwell, PA-C ?10/26/2021 ?7:55 AM ?[facility] Office: [phone] ? ?

## 2021-10-26 NOTE — Progress Notes (Addendum)
?  Progress Note ? ? ? ?10/26/2021 ?7:51 AM ?2 Days Post-Op ? ?Subjective:  no complaints ? ? ?Vitals:  ? 10/25/21 2343 10/26/21 0412  ?BP: 127/66 (!) 141/85  ?Pulse: (!) 54 60  ?Resp: 17 20  ?Temp: 98 ?F (36.7 ?C) 98.2 ?F (36.8 ?C)  ?SpO2: 95% 96%  ? ?Physical Exam: ?Cardiac:  regular ?Lungs:  non labored ?Incisions:  left leg wound with healthy appearing tissue. Wound VAC changed ? ?Extremities:  well perfused and warm. Unna boot in place on left lower leg ?Neurologic: alert and oriented ? ?CBC ?   ?Component Value Date/Time  ? WBC 8.1 10/25/2021 0223  ? RBC 4.25 10/25/2021 0223  ? HGB 10.7 (L) 10/25/2021 0223  ? HCT 33.2 (L) 10/25/2021 0223  ? PLT 192 10/25/2021 0223  ? MCV 78.1 (L) 10/25/2021 0223  ? MCH 25.2 (L) 10/25/2021 0223  ? MCHC 32.2 10/25/2021 0223  ? RDW 17.0 (H) 10/25/2021 0223  ? LYMPHSABS 1.6 05/30/2021 1223  ? MONOABS 0.9 05/30/2021 1223  ? EOSABS 0.5 05/30/2021 1223  ? BASOSABS 0.0 05/30/2021 1223  ? ? ?BMET ?   ?Component Value Date/Time  ? NA 133 (L) 10/25/2021 0223  ? NA 138 07/07/2021 0000  ? K 4.5 10/25/2021 0223  ? CL 100 10/25/2021 0223  ? CO2 23 10/25/2021 0223  ? GLUCOSE 329 (H) 10/25/2021 0223  ? BUN 26 (H) 10/25/2021 0223  ? BUN 24 05/31/2021 1222  ? CREATININE 1.77 (H) 10/25/2021 0223  ? CALCIUM 8.9 10/25/2021 0223  ? GFRNONAA 41 (L) 10/25/2021 0223  ? GFRAA >60 10/29/2019 0226  ? ? ?INR ?   ?Component Value Date/Time  ? INR 1.9 (H) 10/23/2019 0618  ? ? ? ?Intake/Output Summary (Last 24 hours) at 10/26/2021 0751 ?Last data filed at 10/26/2021 9735 ?Gross per 24 hour  ?Intake --  ?Output 2950 ml  ?Net -2950 ml  ? ? ? ?Assessment/Plan:  71 y.o. male is s/p left wound debridement 2 Days Post-Op  ? ?Wound VAC replaced Left leg. Healthy appearing tissue ?Home VAC should all be arranged ?Stable for discharge home today ?He has follow up arranged in 2 weeks with Dr. Donzetta Matters ?He has follow up on 5/8 at the wound care center for [facility] change and Unna boot change ? ? ?Karoline Caldwell, PA-C ?[facility] ?[phone] ?10/26/2021 ?7:51 AM ? ?I have seen and evaluated the patient. I agree with the PA note as documented above.  Left leg VAC changed at bedside this morning.  Wound looks good.  Plan discharge today and has follow-up on Monday for VAC change in the left leg as well as Unna boot change in the wound clinic.  Follow-up with Dr. Donzetta Matters in 2 weeks.  Pain well controlled. ? ?Marty Heck, MD ?[facility] ?Office: [phone] ? ?

## 2021-10-26 NOTE — Plan of Care (Signed)
?  Problem: Education: ?Goal: Knowledge of General Education information will improve ?Description: Including pain rating scale, medication(s)/side effects and non-pharmacologic comfort measures ?Outcome: Adequate for Discharge ?  ?Problem: Health Behavior/Discharge Planning: ?Goal: Ability to manage health-related needs will improve ?Outcome: Adequate for Discharge ?  ?Problem: Clinical Measurements: ?Goal: Ability to maintain clinical measurements within normal limits will improve ?Outcome: Adequate for Discharge ?Goal: Will remain free from infection ?Outcome: Adequate for Discharge ?Goal: Diagnostic test results will improve ?Outcome: Adequate for Discharge ?Goal: Cardiovascular complication will be avoided ?Outcome: Adequate for Discharge ?  ?Problem: Activity: ?Goal: Risk for activity intolerance will decrease ?Outcome: Adequate for Discharge ?  ?Problem: Nutrition: ?Goal: Adequate nutrition will be maintained ?Outcome: Adequate for Discharge ?  ?Problem: Coping: ?Goal: Level of anxiety will decrease ?Outcome: Adequate for Discharge ?  ?Problem: Elimination: ?Goal: Will not experience complications related to urinary retention ?Outcome: Adequate for Discharge ?  ?Problem: Pain Managment: ?Goal: General experience of comfort will improve ?Outcome: Adequate for Discharge ?  ?Problem: Safety: ?Goal: Ability to remain free from injury will improve ?Outcome: Adequate for Discharge ?  ?Problem: Skin Integrity: ?Goal: Risk for impaired skin integrity will decrease ?Outcome: Adequate for Discharge ?  ?

## 2021-10-27 ENCOUNTER — Encounter (HOSPITAL_BASED_OUTPATIENT_CLINIC_OR_DEPARTMENT_OTHER): Payer: Medicare Other | Admitting: General Surgery

## 2021-10-30 ENCOUNTER — Encounter (HOSPITAL_BASED_OUTPATIENT_CLINIC_OR_DEPARTMENT_OTHER): Payer: Medicare Other | Admitting: General Surgery

## 2021-10-30 DIAGNOSIS — E1151 Type 2 diabetes mellitus with diabetic peripheral angiopathy without gangrene: Secondary | ICD-10-CM | POA: Diagnosis not present

## 2021-10-30 DIAGNOSIS — I890 Lymphedema, not elsewhere classified: Secondary | ICD-10-CM | POA: Diagnosis not present

## 2021-10-30 DIAGNOSIS — L97528 Non-pressure chronic ulcer of other part of left foot with other specified severity: Secondary | ICD-10-CM | POA: Diagnosis not present

## 2021-10-30 DIAGNOSIS — I87322 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: Secondary | ICD-10-CM | POA: Diagnosis not present

## 2021-10-30 DIAGNOSIS — E11621 Type 2 diabetes mellitus with foot ulcer: Secondary | ICD-10-CM | POA: Diagnosis present

## 2021-10-30 DIAGNOSIS — L97828 Non-pressure chronic ulcer of other part of left lower leg with other specified severity: Secondary | ICD-10-CM | POA: Diagnosis not present

## 2021-10-30 NOTE — Progress Notes (Signed)
[name] ([id number]) ?Visit Report for 10/30/2021 ?Chief Complaint Document Details ?Patient Name: Date of Service: ?[name], [name] 10/30/2021 11:30 A M ?Medical Record Number: [id number] ?Patient Account Number: [id number] ?Date of Birth/Sex: Treating RN: ?Nov 09, 1950 (71 y.o. [name] ?Primary Care Provider: [name] Other Clinician: ?Referring Provider: ?Treating Provider/Extender: [name] ?[name] ?Weeks in Treatment: 28 ?Information Obtained from: Patient ?Chief Complaint ?Left leg and foot ulcers ?04/12/2021; patient is here for wounds on his left lower leg and left plantar foot over the first metatarsal head ?Electronic Signature(s) ?Signed: 10/30/2021 12:23:55 PM By: [name] Maudlin MD FACS ?Entered By: [name] on 10/30/2021 12:23:55 ?-------------------------------------------------------------------------------- ?Debridement Details ?Patient Name: Date of Service: ?[name], [name] 10/30/2021 11:30 A M ?Medical Record Number: [id number] ?Patient Account Number: [id number] ?Date of Birth/Sex: Treating RN: ?[birth year] (71 y.o. [name] ?Primary Care Provider: [name] Other Clinician: ?Referring Provider: ?Treating Provider/Extender: [name] ?[name] ?Weeks in Treatment: 28 ?Debridement Performed for Assessment: Wound #18 Left,Plantar Metatarsal head first ?Performed By: Physician [name] Maudlin, MD ?Debridement Type: Debridement ?Severity of Tissue Pre Debridement: Fat layer exposed ?Level of Consciousness (Pre-procedure): Awake and Alert ?Pre-procedure Verification/Time Out Yes - 11:51 ?Taken: ?Start Time: 11:51 ?Pain Control: ?Other : benzocaine 20% ?T Area Debrided (L x W): ?otal 1 (cm) x 1 (cm) = 1 (cm?) ?Tissue and other material debrided: ?Non-Viable, Callus, Skin: Epidermis ?Level: Skin/Epidermis ?Debridement Description: Selective/Open Wound ?Instrument: Curette ?Bleeding: Minimum ?Hemostasis Achieved: Pressure ?Procedural Pain: 0 ?Post  Procedural Pain: 0 ?Response to Treatment: Procedure was tolerated well ?Level of Consciousness (Post- Awake and Alert ?procedure): ?Post Debridement Measurements of Total Wound ?Length: (cm) 0.8 ?Width: (cm) 1.5 ?Depth: (cm) 0.1 ?Volume: (cm?) 0.094 ?Character of Wound/Ulcer Post Debridement: Improved ?Severity of Tissue Post Debridement: Fat layer exposed ?Post Procedure Diagnosis ?Same as Pre-procedure ?Electronic Signature(s) ?Signed: 10/30/2021 12:32:15 PM By: [name] Maudlin MD FACS ?Signed: 10/30/2021 4:43:42 PM By: [name] ?Entered By: [name] on 10/30/2021 11:52:26 ?-------------------------------------------------------------------------------- ?HPI Details ?Patient Name: Date of Service: ?[name], [name] 10/30/2021 11:30 A M ?Medical Record Number: [id number] ?Patient Account Number: [id number] ?Date of Birth/Sex: Treating RN: ?11/15/1950 (71 y.o. [name] ?Primary Care Provider: [name] Other Clinician: ?Referring Provider: ?Treating Provider/Extender: [name] ?[name] ?Weeks in Treatment: 28 ?History of Present Illness ?HPI Description: 10/11/17; Mr. Glasheen is a 71 year old man who tells me that in 2015 he slipped down the latter traumatizing his left leg. He developed a wound ?in the same spot the area that we are currently looking at. He states this closed over for the most part although he always felt it was somewhat unstable. In ?2016 he hit the same area with the door of his car had this reopened. He tells me that this is never really closed although sometimes an inflow it remains open ?on a constant basis. He has not been using any specific dressing to this except for topical antibiotics the nature of which were not really sure. ?His primary doctor did send him to see Dr. [name] of interventional cardiology. He underwent an angiogram on 08/06/17 and he underwent a PTA and directional ?atherectomy of the lesser distal SFA and popliteal arteries which resulted  in brisk improvement in blood flow. It was noted that he had 2 vessel runoff through ?the anterior tibial and peroneal. He is also been to see vascular and interventional radiologist. He was not felt to have any significant superficial venous ?insufficiency. Presumably is not a candidate for any ablation.  It was suggested he come here for wound care. ?The patient is a type II diabetic on insulin. He also has a history of venous insufficiency. ?ABIs on the left were noncompressible in our clinic ?10/21/17; patient we admitted to the clinic last week. 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 We ?put Iodosorb on him after an aggressive debridement and 3 layer compression. He complained of pain in his ankle and itching with is skin in fact he scratched ?the area on the medial calf superiorly at the rim of our wraps and he has 2 small open areas in that location today which are new. I changed his primary ?dressing today to silver collagen. As noted he is already had revascularization and does not have any significant superficial venous insufficiency that would ?be amenable to ablation ?10/28/17; patient admitted to the clinic 2 weeks ago. He has a smaller Wound. Scratch injury from last week revealed. There is large wound over the tibial area. ?This is smaller. Granulation looks healthy. No need for debridement. ?11/04/17; the wound on the left lateral calf looks better. Improved dimensions. Surface of this looks better. We've been maintaining him and Kerlix Coban ?wraps. He finds this much more comfortable. Silver collagen dressing ?11/11/17; left lateral Wound continues to look healthy be making progress. Using a #5 curet I removed removed nonviable skin from the surface of the wound ?and then necrotic debris from the wound surface. Surface of the wound continues to look healthy. ?He also has an open area on the left great toenail bed. We've been using  topical antibiotics. ?11/19/17; left anterior lateral wound continues to look healthy but it's not closed. ?He also had a small wound above this on the left leg ?Initially traumatic wounds in the setting of significant chronic venous insufficiency and stasis dermatitis ?11/25/17; left anterior wounds superiorly is closed still a small wound inferiorly. ?12/02/17; left anterior tibial area. Arrives today with adherent callus. Post debridement clearly not completely closed. Hydrofera Blue under 3 layer compression. ?12/09/17; left anterior tibia. Circumferential eschar however the wound bed looks stable to improved. We've been using Hydrofera Blue under 3 layer ?compression ?12/17/17; left anterior tibia. Apparently this was felt to be closed however when the wrap was taken off there is a skin tear to reopen wounds in the same area ?we've been using Hydrofera Blue under 3 layer compression ?12/23/17 left anterior tibia. Not close to close this week apparently the [name] was stuck to this again. Still circumferential eschar requiring debridement. I ?put a contact layer on this this time under the Hydrofera Blue ?12/31/17; left anterior tibia. Wound is better slight amount of hyper-granulation. Using Hydrofera Blue over Adaptic. ?01/07/18; left anterior tibia. The wound had some surface eschar however after this was removed he has no open wound.he was already revascularized by Dr. ?Ganji when he came to our clinic with atherectomy of the left SFA and popliteal artery. He was also sent to interventional radiology for venous reflux studies. ?He was not felt to have significant reflux but certainly has chronic venous changes of his skin with hemosiderin deposition around this area. He will definitely ?need to lubricate his skin and wear compression stocking and I've talked to him about this. ?READMISSION ?05/26/2018 ?This is a now 71 year old man we cared for with traumatic wounds on his left anterior lower extremity. He had  been previously revascularized during that ?admission by Dr. [name]. Apparently in follow-up Dr.  Ganji noted that he had deterioration in his arterial status. He underwent a stent placement in the distal l

## 2021-10-30 NOTE — Progress Notes (Signed)
DIA, JEFFERYS ([id number]) ?Visit Report for 10/30/2021 ?Arrival Information Details ?Patient Name: Date of Service: ?[name], [name] 10/30/2021 11:30 A M ?Medical Record Number: [id number] ?Patient Account Number: [id number] ?Date of Birth/Sex: Treating RN: ?03-29-1951 (71 y.o. [name] ?Primary Care [name]: [name] Other Clinician: ?Referring [name]: ?Treating [name]/Extender: [name] ?[name] ?Weeks in Treatment: 28 ?Visit Information History Since Last Visit ?Added or deleted any medications: No ?Patient Arrived: Ambulatory ?Any new allergies or adverse reactions: No ?Arrival Time: 11:24 ?Had a fall or experienced change in No ?Accompanied By: self ?activities of daily living that may affect ?Transfer Assistance: None ?risk of falls: ?Patient Requires Transmission-Based Precautions: No ?Signs or symptoms of abuse/neglect since last visito No ?Patient Has Alerts: Yes ?Hospitalized since last visit: Yes ?Patient Alerts: ?ABI's: 09/22 L:1.09 R: 1. ?Implantable device outside of the clinic excluding No ?TBI's: R:0.72 L:0.99 ?cellular tissue based products placed in the center ?since last visit: ?Has Dressing in Place as Prescribed: Yes ?Pain Present Now: Yes ?Electronic Signature(s) ?Signed: 10/30/2021 4:43:42 PM By: [name] ?Entered By: [name] on 10/30/2021 11:27:22 ?-------------------------------------------------------------------------------- ?Compression Therapy Details ?Patient Name: Date of Service: ?[name], [name] 10/30/2021 11:30 A M ?Medical Record Number: [id number] ?Patient Account Number: [id number] ?Date of Birth/Sex: Treating RN: ?08-May-1951 (71 y.o. [name] ?Primary Care [name]: [name] Other Clinician: ?Referring Braeleigh Pyper: ?Treating [name]/Extender: [name] ?[name] ?Weeks in Treatment: 28 ?Compression Therapy Performed for Wound Assessment: Wound #22 Left,Lateral Lower Leg ?Performed By: Clinician [name] Peals, RN ?Compression Type: Double Layer ?Post Procedure Diagnosis ?Same as Pre-procedure ?Electronic Signature(s) ?Signed: 10/30/2021 4:43:42 PM By: [name] ?Entered By: [name] on 10/30/2021 11:59:28 ?-------------------------------------------------------------------------------- ?Encounter Discharge Information Details ?Patient Name: ?Date of Service: ?[name], [name] 10/30/2021 11:30 A M ?Medical Record Number: [id number] ?Patient Account Number: [id number] ?Date of Birth/Sex: ?Treating RN: ?Jan 29, 1951 (71 y.o. [name] ?Primary Care [name]: [name] ?Other Clinician: ?Referring [name]: ?Treating Keltie Labell/Extender: [name] ?[name] ?Weeks in Treatment: 28 ?Encounter Discharge Information Items Post Procedure Vitals ?Discharge Condition: Stable ?Temperature (F): 98.2 ?Ambulatory Status: Ambulatory ?Pulse (bpm): 84 ?Discharge Destination: Home ?Respiratory Rate (breaths/min): 18 ?Transportation: Private Auto ?Blood Pressure (mmHg): 172/82 ?Accompanied By: self ?Schedule Follow-up Appointment: Yes ?Clinical Summary of Care: Patient Declined ?Electronic Signature(s) ?Signed: 10/30/2021 4:43:42 PM By: [name] ?Entered By: [name] on 10/30/2021 12:01:17 ?-------------------------------------------------------------------------------- ?Lower Extremity Assessment Details ?Patient Name: ?Date of Service: ?[name], [name] 10/30/2021 11:30 A M ?Medical Record Number: [id number] ?Patient Account Number: [id number] ?Date of Birth/Sex: ?Treating RN: ?11-06-1950 (71 y.o. [name] ?Primary Care [name]: [name] ?Other Clinician: ?Referring [name]: ?Treating [name]/Extender: [name] ?[name] ?Weeks in Treatment: 28 ?Edema Assessment ?Assessed: [Left: No] [Right: No] ?Edema: [Left: Ye] [Right: s] ?Calf ?Left: Right: ?Point of Measurement: 41 cm From Medial Instep 41.9 cm ?Ankle ?Left: Right: ?Point of Measurement: 10 cm From  Medial Instep 26 cm ?Vascular Assessment ?Pulses: ?Dorsalis Pedis ?Palpable: [Left:Yes] ?Electronic Signature(s) ?Signed: 10/30/2021 4:43:42 PM By: [name] ?Entered By: [name] on 10/30/2021 11:33:32 ?-------------------------------------------------------------------------------- ?Multi Wound Chart Details ?Patient Name: ?Date of Service: ?[name], [name] 10/30/2021 11:30 A M ?Medical Record Number: [id number] ?Patient Account Number: [id number] ?Date of Birth/Sex: ?Treating RN: ?1951-05-29 (71 y.o. [name] ?Primary Care [name]: [name] ?Other Clinician: ?Referring [name]: ?Treating [name]/Extender: [name] ?[name] ?Weeks in Treatment: 28 ?Vital Signs ?Height(in): 74 ?Pulse(bpm): 84 ?Weight(lbs): 238 ?Blood Pressure(mmHg): 172/82 ?Body Mass Index(BMI): 30.6 ?Temperature(??F): 98.2 ?Respiratory Rate(breaths/min):  18 ?Photos: ?Left, Plantar Metatarsal head first Left, Lateral Lower Leg Left, Medial Upper Leg ?Wound Location: ?Gradually Appeared Bump Trauma ?Wounding Event: ?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 ?Primary Etiology: ?Extremity ?Glaucoma, Sleep Apnea, Glaucoma, Sleep Apnea, Glaucoma, Sleep Apnea, ?Comorbid History: ?Hypertension, Peripheral Arterial Hypertension, Peripheral Arterial Hypertension, Peripheral Arterial ?Disease, Peripheral Venous Disease, ?Disease, Peripheral Venous Disease, Disease, Peripheral Venous Disease, ?Type II Diabetes, Gout, Osteoarthritis, ?Type II Diabetes, Gout, Osteoarthritis, Type II Diabetes, Gout, Osteoarthritis, ?Neuropathy Neuropathy Neuropathy ?08/23/2020 06/03/2021 09/14/2021 ?Date Acquired: ?'28 21 5 '$ ?Weeks of Treatment: ?Open Open Open ?Wound Status: ?No No No ?Wound Recurrence: ?0.8x1.5x0.1 14.7x5.1x2 0x0x0 ?Measurements L x W x D (cm) ?0.942 58.881 0 ?A (cm?) : ?rea ?0.094 117.763 0 ?Volume (cm?) : ?93.30% -3470.70% 100.00% ?% Reduction in A rea: ?96.70% -8828.20% 100.00% ?% Reduction in Volume: ?12 ?Position  1 (o'clock): ?9.5 ?Maximum Distance 1 (cm): ?No Yes No ?Tunneling: ?Grade 2 Full Thickness With Exposed Support Full Thickness Without Exposed ?Classification: ?Structures Support Structures ?Medium Large None Present ?Exudate A mount: ?Serosanguineous Serosanguineous N/A ?Exudate Type: ?red, brown red, brown N/A ?Exudate Color: ?N/A Distinct, outline attached N/A ?Wound Margin: ?Large (67-100%) Large (67-100%) None Present (0%) ?Granulation A mount: ?Red Red N/A ?Granulation Quality: ?None Present (0%) Small (1-33%) None Present (0%) ?Necrotic A mount: ?Fat Layer (Subcutaneous Tissue): Yes Fat Layer (Subcutaneous Tissue): Yes Fascia: No ?Exposed Structures: ?Fascia: No ?Fascia: No ?Fat Layer (Subcutaneous Tissue): No ?Tendon: No ?Tendon: No ?Tendon: No ?Muscle: No ?Muscle: No ?Muscle: No ?Joint: No ?Joint: No ?Joint: No ?Bone: No ?Bone: No ?Bone: No ?None None Large (67-100%) ?Epithelialization: ?Debridement - Selective/Open Wound N/A N/A ?Debridement: ?Pre-procedure Verification/Time Out 11:51 N/A N/A ?Taken: ?Other N/A N/A ?Pain Control: ?Callus N/A N/A ?Tissue Debrided: ?Skin/Epidermis N/A N/A ?Level: ?1 N/A N/A ?Debridement A (sq cm): ?rea ?Curette N/A N/A ?Instrument: ?Minimum N/A N/A ?Bleeding: ?Pressure N/A N/A ?Hemostasis A chieved: ?0 N/A N/A ?Procedural Pain: ?0 N/A N/A ?Post Procedural Pain: ?Procedure was tolerated well N/A N/A ?Debridement Treatment Response: ?0.8x1.5x0.1 N/A N/A ?Post Debridement Measurements L x ?W x D (cm) ?0.094 N/A N/A ?Post Debridement Volume: (cm?) ?Debridement Compression Therapy N/A ?Procedures Performed: ?Treatment Notes ?Wound #18 (Metatarsal head first) Wound Laterality: Plantar, Left ?Cleanser ?Soap and Water ?Discharge Instruction: May shower and wash wound with dial antibacterial soap and water prior to dressing change. ?Wound Cleanser ?Discharge Instruction: Cleanse the wound with wound cleanser prior to applying a clean dressing using gauze sponges, not tissue or  cotton balls. ?Peri-Wound Care ?Triamcinolone 15 (g) ?Discharge Instruction: Use triamcinolone 15 (g) as directed ?Sween Lotion (Moisturizing lotion) ?Discharge Instruction: Apply moisturizing lotion as d

## 2021-11-01 ENCOUNTER — Encounter: Payer: Self-pay | Admitting: Nurse Practitioner

## 2021-11-01 ENCOUNTER — Ambulatory Visit (INDEPENDENT_AMBULATORY_CARE_PROVIDER_SITE_OTHER): Payer: Medicare Other | Admitting: Nurse Practitioner

## 2021-11-01 VITALS — BP 146/80 | HR 58 | Ht 73.5 in | Wt 269.0 lb

## 2021-11-01 DIAGNOSIS — Z794 Long term (current) use of insulin: Secondary | ICD-10-CM

## 2021-11-01 DIAGNOSIS — E1165 Type 2 diabetes mellitus with hyperglycemia: Secondary | ICD-10-CM

## 2021-11-01 DIAGNOSIS — I10 Essential (primary) hypertension: Secondary | ICD-10-CM

## 2021-11-01 DIAGNOSIS — E782 Mixed hyperlipidemia: Secondary | ICD-10-CM

## 2021-11-01 LAB — POCT GLYCOSYLATED HEMOGLOBIN (HGB A1C): HbA1c POC (<> result, manual entry): 9.9 % (ref 4.0–5.6)

## 2021-11-01 NOTE — Patient Instructions (Signed)
Diabetes Mellitus and Foot Care Foot care is an important part of your health, especially when you have diabetes. Diabetes may cause you to have problems because of poor blood flow (circulation) to your feet and legs, which can cause your skin to: Become thinner and drier. Break more easily. Heal more slowly. Peel and crack. You may also have nerve damage (neuropathy) in your legs and feet, causing decreased feeling in them. This means that you may not notice minor injuries to your feet that could lead to more serious problems. Noticing and addressing any potential problems early is the best way to prevent future foot problems. How to care for your feet Foot hygiene  Wash your feet daily with warm water and mild soap. Do not use hot water. Then, pat your feet and the areas between your toes until they are completely dry. Do not soak your feet as this can dry your skin. Trim your toenails straight across. Do not dig under them or around the cuticle. File the edges of your nails with an emery board or nail file. Apply a moisturizing lotion or petroleum jelly to the skin on your feet and to dry, brittle toenails. Use lotion that does not contain alcohol and is unscented. Do not apply lotion between your toes. Shoes and socks Wear clean socks or stockings every day. Make sure they are not too tight. Do not wear knee-high stockings since they may decrease blood flow to your legs. Wear shoes that fit properly and have enough cushioning. Always look in your shoes before you put them on to be sure there are no objects inside. To break in new shoes, wear them for just a few hours a day. This prevents injuries on your feet. Wounds, scrapes, corns, and calluses  Check your feet daily for blisters, cuts, bruises, sores, and redness. If you cannot see the bottom of your feet, use a mirror or ask someone for help. Do not cut corns or calluses or try to remove them with medicine. If you find a minor scrape,  cut, or break in the skin on your feet, keep it and the skin around it clean and dry. You may clean these areas with mild soap and water. Do not clean the area with peroxide, alcohol, or iodine. If you have a wound, scrape, corn, or callus on your foot, look at it several times a day to make sure it is healing and not infected. Check for: Redness, swelling, or pain. Fluid or blood. Warmth. Pus or a bad smell. General tips Do not cross your legs. This may decrease blood flow to your feet. Do not use heating pads or hot water bottles on your feet. They may burn your skin. If you have lost feeling in your feet or legs, you may not know this is happening until it is too late. Protect your feet from hot and cold by wearing shoes, such as at the beach or on hot pavement. Schedule a complete foot exam at least once a year (annually) or more often if you have foot problems. Report any cuts, sores, or bruises to your health care provider immediately. Where to find more information American Diabetes Association: www.diabetes.org Association of Diabetes Care & Education Specialists: www.diabeteseducator.org Contact a health care provider if: You have a medical condition that increases your risk of infection and you have any cuts, sores, or bruises on your feet. You have an injury that is not healing. You have redness on your legs or feet. You   feel burning or tingling in your legs or feet. You have pain or cramps in your legs and feet. Your legs or feet are numb. Your feet always feel cold. You have pain around any toenails. Get help right away if: You have a wound, scrape, corn, or callus on your foot and: You have pain, swelling, or redness that gets worse. You have fluid or blood coming from the wound, scrape, corn, or callus. Your wound, scrape, corn, or callus feels warm to the touch. You have pus or a bad smell coming from the wound, scrape, corn, or callus. You have a fever. You have a red  line going up your leg. Summary Check your feet every day for blisters, cuts, bruises, sores, and redness. Apply a moisturizing lotion or petroleum jelly to the skin on your feet and to dry, brittle toenails. Wear shoes that fit properly and have enough cushioning. If you have foot problems, report any cuts, sores, or bruises to your health care provider immediately. Schedule a complete foot exam at least once a year (annually) or more often if you have foot problems. This information is not intended to replace advice given to you by your health care provider. Make sure you discuss any questions you have with your health care provider. Document Revised: 12/31/2019 Document Reviewed: 12/31/2019 [employer] Patient Education  2023 [employer]  
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
## 2021-11-02 ENCOUNTER — Encounter (HOSPITAL_BASED_OUTPATIENT_CLINIC_OR_DEPARTMENT_OTHER): Payer: Medicare Other | Admitting: General Surgery

## 2021-11-02 DIAGNOSIS — E11621 Type 2 diabetes mellitus with foot ulcer: Secondary | ICD-10-CM | POA: Diagnosis not present

## 2021-11-02 NOTE — Progress Notes (Signed)
WAYMON, LASER ([id number]) ?Visit Report for 11/02/2021 ?Arrival Information Details ?Patient Name: Date of Service: ?[name], [name] 11/02/2021 10:00 A M ?Medical Record Number: [id number] ?Patient Account Number: [id number] ?Date of Birth/Sex: Treating RN: ?[birth year] (71 y.o. [name] ?Primary Care River Ambrosio: [name] Other Clinician: ?Referring Milka Windholz: ?Treating Ronald Vinsant/Extender: Fredirick Maudlin ?[name] ?Weeks in Treatment: 29 ?Visit Information History Since Last Visit ?Added or deleted any medications: No ?Patient Arrived: Ambulatory ?Any new allergies or adverse reactions: No ?Arrival Time: 09:52 ?Had a fall or experienced change in No ?Accompanied By: self ?activities of daily living that may affect ?Transfer Assistance: None ?risk of falls: ?Patient Identification Verified: Yes ?Signs or symptoms of abuse/neglect since last visito No ?Secondary Verification Process Completed: Yes ?Hospitalized since last visit: No ?Patient Requires Transmission-Based Precautions: No ?Implantable device outside of the clinic excluding No ?Patient Has Alerts: Yes ?cellular tissue based products placed in the center ?Patient Alerts: ?ABI's: 09/22 L:1.09 R: 1. since last visit: ?TBI's: R:0.72 L:0.99 ?Has Dressing in Place as Prescribed: Yes ?Pain Present Now: Yes ?Electronic Signature(s) ?Signed: 11/02/2021 4:55:03 PM By: Adline Peals ?Entered By: Adline Peals on 11/02/2021 09:54:01 ?-------------------------------------------------------------------------------- ?Compression Therapy Details ?Patient Name: Date of Service: ?[name], [name] 11/02/2021 10:00 A M ?Medical Record Number: [id number] ?Patient Account Number: [id number] ?Date of Birth/Sex: Treating RN: ?[birth year] (71 y.o. [name] ?Primary Care Elizardo Chilson: [name] Other Clinician: ?Referring Iza Preston: ?Treating Cypress Fanfan/Extender: Fredirick Maudlin ?[name] ?Weeks in Treatment: 29 ?Compression Therapy Performed for  Wound Assessment: Wound #22 Left,Lateral Lower Leg ?Performed By: Clinician Adline Peals, RN ?Compression Type: Double Layer ?Electronic Signature(s) ?Signed: 11/02/2021 4:55:03 PM By: Adline Peals ?Entered By: Adline Peals on 11/02/2021 10:45:44 ?-------------------------------------------------------------------------------- ?Encounter Discharge Information Details ?Patient Name: Date of Service: ?[name], [name] 11/02/2021 10:00 A M ?Medical Record Number: [id number] ?Patient Account Number: [id number] ?Date of Birth/Sex: ?Treating RN: ?[birth year] (71 y.o. [name] ?Primary Care Yuto Cajuste: ?Other Clinician: ?[name] ?Referring Khadejah Son: ?Treating Yaritsa Savarino/Extender: Fredirick Maudlin ?[name] ?Weeks in Treatment: 29 ?Encounter Discharge Information Items ?Discharge Condition: Stable ?Ambulatory Status: Ambulatory ?Discharge Destination: Home ?Transportation: Private Auto ?Accompanied By: self ?Schedule Follow-up Appointment: Yes ?Clinical Summary of Care: Patient Declined ?Electronic Signature(s) ?Signed: 11/02/2021 4:55:03 PM By: Adline Peals ?Entered By: Adline Peals on 11/02/2021 10:46:16 ?-------------------------------------------------------------------------------- ?Negative Pressure Wound Therapy Maintenance (NPWT) Details ?Patient Name: ?Date of Service: ?[name], [name] 11/02/2021 10:00 A M ?Medical Record Number: [id number] ?Patient Account Number: [id number] ?Date of Birth/Sex: ?Treating RN: ?12/19/1950 (71 y.o. [name] ?Primary Care Fatimah Sundquist: [name] ?Other Clinician: ?Referring Lindzey Zent: ?Treating Vaishali Baise/Extender: Fredirick Maudlin ?[name] ?Weeks in Treatment: 29 ?NPWT Maintenance Performed for: Wound #22 Left, Lateral Lower Leg ?Additional Injuries Covered: No ?Performed By: Adline Peals, RN ?Type: VAC System ?Coverage Size (sq cm): 74.97 ?Pressure Type: Constant ?Pressure Setting: 125 mmHG ?Drain Type: None ?Sponge/Dressing  Type: ?Combination : black and white ?Date Initiated: 07/27/2021 ?Dressing Removed: No ?Quantity of Sponges/Gauze Removed: 2 ?Canister Changed: No ?Dressing Reapplied: Yes ?Quantity of Sponges/Gauze Inserted: 2 ?Days On NPWT : 99 ?Electronic Signature(s) ?Signed: 11/02/2021 4:55:03 PM By: Adline Peals ?Entered By: Adline Peals on 11/02/2021 10:45:21 ?-------------------------------------------------------------------------------- ?Patient/Caregiver Education Details ?Patient Name: ?Date of Service: ?[name], [name] 5/11/2023andnbsp10:00 A M ?Medical Record Number: [id number] ?Patient Account Number: [id number] ?Date of Birth/Gender: ?Treating RN: ?31-Oct-1950 (71 y.o. [name] ?Primary Care Physician: [name] ?Other Clinician: ?Referring Physician: ?Treating Physician/Extender: Fredirick Maudlin ?[name] ?Weeks in Treatment: 29 ?Education Assessment ?Education Provided To: ?Patient ?  Education Topics Provided ?Wound/Skin Impairment: ?Methods: Explain/Verbal ?Responses: Reinforcements needed, State content correctly ?Electronic Signature(s) ?Signed: 11/02/2021 4:55:03 PM By: Adline Peals ?Entered By: Adline Peals on 11/02/2021 10:46:05 ?-------------------------------------------------------------------------------- ?Wound Assessment Details ?Patient Name: ?Date of Service: ?[name], [name] 11/02/2021 10:00 A M ?Medical Record Number: [id number] ?Patient Account Number: [id number] ?Date of Birth/Sex: ?Treating RN: ?[birth year] (71 y.o. [name] ?Primary Care Zayed Griffie: [name] ?Other Clinician: ?Referring Numair Masden: ?Treating Afra Tricarico/Extender: Fredirick Maudlin ?[name] ?Weeks in Treatment: 29 ?Wound Status ?Wound Number: 18 ?Primary Etiology: Diabetic Wound/Ulcer of the Lower Extremity ?Wound Location: Left, Plantar Metatarsal head first ?Wound Status: Open ?Wounding Event: Gradually Appeared ?Date Acquired: 08/23/2020 ?Weeks Of Treatment: 29 ?Clustered Wound:  No ?Wound Measurements ?Length: (cm) 0.8 ?Width: (cm) 1.5 ?Depth: (cm) 0.1 ?Area: (cm?) 0.942 ?Volume: (cm?) 0.094 ?% Reduction in Area: 93.3% ?% Reduction in Volume: 96.7% ?Wound Description ?Classification: Grade 2 ?Exudate Amount: Medium ?Exudate Type: Serosanguineous ?Exudate Color: red, brown ?Treatment Notes ?Wound #18 (Metatarsal head first) Wound Laterality: Plantar, Left ?Cleanser ?Soap and Water ?Discharge Instruction: May shower and wash wound with dial antibacterial soap and water prior to dressing change. ?Wound Cleanser ?Discharge Instruction: Cleanse the wound with wound cleanser prior to applying a clean dressing using gauze sponges, not tissue or cotton ?balls. ?Peri-Wound Care ?Triamcinolone 15 (g) ?Discharge Instruction: Use triamcinolone 15 (g) as directed ?Sween Lotion (Moisturizing lotion) ?Discharge Instruction: Apply moisturizing lotion as directed ?Topical ?Primary Dressing ?Promogran Prisma Matrix, 4.34 (sq in) (silver collagen) ?Discharge Instruction: Moisten collagen with saline or hydrogel ?Secondary Dressing ?Woven Gauze Sponge, Non-Sterile 4x4 in ?Discharge Instruction: Apply over primary dressing as directed. ?Secured With ?76M Medipore H Soft Cloth Surgical T ape, 4 x 10 (in/yd) ?Discharge Instruction: Secure with tape as directed. ?Compression Wrap ?CoFlex TLC XL 2-layer Compression System 4x7 (in/yd) ?Discharge Instruction: Apply CoFlex 2-layer compression as directed. (alt for 4 layer) ?Compression Stockings ?Add-Ons ?Electronic Signature(s) ?Signed: 11/02/2021 4:55:03 PM By: Adline Peals ?Entered By: Adline Peals on 11/02/2021 10:02:25 ?-------------------------------------------------------------------------------- ?Wound Assessment Details ?Patient Name: ?Date of Service: ?[name], [name] 11/02/2021 10:00 A M ?Medical Record Number: [id number] ?Patient Account Number: [id number] ?Date of Birth/Sex: ?Treating RN: ?02-27-1951 (71 y.o. [name] ?Primary Care  Adamaris King: [name] ?Other Clinician: ?Referring Sameena Artus: ?Treating Arlis Yale/Extender: Fredirick Maudlin ?[name] ?Weeks in Treatment: 29 ?Wound Status ?Wound Number: 22 ?Primary Etiology: Cyst ?[place]

## 2021-11-02 NOTE — Progress Notes (Signed)
ZEPH, RIEBEL ([id number]) ?Visit Report for 11/02/2021 ?SuperBill Details ?Patient Name: Date of Service: ?[name], [name]. 11/02/2021 ?Medical Record Number: [id number] ?Patient Account Number: [id number] ?Date of Birth/Sex: Treating RN: ?[birth year] (71 y.o. [name] ?Primary Care Provider: [name] Other Clinician: ?Referring Provider: ?Treating Provider/Extender: Fredirick Maudlin ?[name] ?Weeks in Treatment: 29 ?Diagnosis Coding ?ICD-10 Codes ?Code Description ?E11.621 Type 2 diabetes mellitus with foot ulcer ?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 ?I89.0 Lymphedema, not elsewhere classified ?S31.594 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 ?V85.929 Non-pressure chronic ulcer of other part of left lower leg with other specified severity ?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 of other part of left foot with other specified severity ?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 ?Facility Procedures ?CPT4 Code Description Modifier Quantity ?24462863 97606 - WOUND VAC-GREATER TH 50 SQ CM 1 ?[id number] (Facility Use Only) 29581LT - APPLY MULTLAY COMPRS LWR LT LEG 1 ?Electronic Signature(s) ?Signed: 11/02/2021 11:38:07 AM By: Fredirick Maudlin MD FACS ?Signed: 11/02/2021 4:55:03 PM By: Adline Peals ?Previous Signature: 11/02/2021 11:16:17 AM Version By: Fredirick Maudlin MD FACS ?Entered By: Adline Peals on 11/02/2021 11:34:33 ?

## 2021-11-06 ENCOUNTER — Encounter (HOSPITAL_BASED_OUTPATIENT_CLINIC_OR_DEPARTMENT_OTHER): Payer: Medicare Other | Admitting: General Surgery

## 2021-11-06 DIAGNOSIS — E11621 Type 2 diabetes mellitus with foot ulcer: Secondary | ICD-10-CM | POA: Diagnosis not present

## 2021-11-06 NOTE — Progress Notes (Signed)
[name] ([id number]) ?Visit Report for 11/06/2021 ?Chief Complaint Document Details ?Patient Name: Date of Service: ?[name], [name] 11/06/2021 9:00 A M ?Medical Record Number: [id number] ?Patient Account Number: [id number] ?Date of Birth/Sex: Treating RN: ?[birth year] (71 y.o. [name] ?Primary Care Provider: Jilda [name] Other Clinician: ?Referring Provider: ?Treating Provider/Extender: [name] ?[name] ?Weeks in Treatment: 29 ?Information Obtained from: Patient ?Chief Complaint ?Left leg and foot ulcers ?04/12/2021; patient is here for wounds on his left lower leg and left plantar foot over the first metatarsal head ?Electronic Signature(s) ?Signed: 11/06/2021 10:39:49 AM By: [name] Maudlin MD [name] ?Entered By: [name] on 11/06/2021 10:39:49 ?-------------------------------------------------------------------------------- ?Debridement Details ?Patient Name: Date of Service: ?[name], [name] 11/06/2021 9:00 A M ?Medical Record Number: [id number] ?Patient Account Number: [id number] ?Date of Birth/Sex: Treating RN: ?08-31-1950 (71 y.o. [name] ?Primary Care Provider: Jilda [name] Other Clinician: ?Referring Provider: ?Treating Provider/Extender: [name] ?[name] ?Weeks in Treatment: 29 ?Debridement Performed for Assessment: Wound #25 Left,Anterior Lower Leg ?Performed By: Physician [name] Maudlin, MD ?Debridement Type: Debridement ?Severity of Tissue Pre Debridement: Fat layer exposed ?Level of Consciousness (Pre-procedure): Awake and Alert ?Pre-procedure Verification/Time Out Yes - 10:10 ?Taken: ?Start Time: 10:13 ?Pain Control: ?Other : benzocaine 20% spray ?T Area Debrided (L x W): ?otal 1.2 (cm) x 1 (cm) = 1.2 (cm?) ?Tissue and other material debrided: Viable, Non-Viable, Slough, Subcutaneous, Slough ?Level: Skin/Subcutaneous Tissue ?Debridement Description: Excisional ?Instrument: Curette ?Bleeding: Minimum ?Hemostasis Achieved: Pressure ?Procedural  Pain: 0 ?Post Procedural Pain: 0 ?Response to Treatment: Procedure was tolerated well ?Level of Consciousness (Post- Awake and Alert ?procedure): ?Post Debridement Measurements of Total Wound ?Length: (cm) 1.2 ?Width: (cm) 1 ?Depth: (cm) 0.1 ?Volume: (cm?) 0.094 ?Character of Wound/Ulcer Post Debridement: Improved ?Severity of Tissue Post Debridement: Fat layer exposed ?Post Procedure Diagnosis ?Same as Pre-procedure ?Electronic Signature(s) ?Signed: 11/06/2021 11:55:28 AM By: [name] Maudlin MD [name] ?Signed: 11/06/2021 5:49:11 PM By: [name] ?Entered By: [name] Gouty on 11/06/2021 10:16:13 ?-------------------------------------------------------------------------------- ?Debridement Details ?Patient Name: ?Date of Service: ?[name], [name] 11/06/2021 9:00 A M ?Medical Record Number: [id number] ?Patient Account Number: [id number] ?Date of Birth/Sex: ?Treating RN: ?Jul 12, 1950 (71 y.o. [name] ?Primary Care Provider: Jilda [name] ?Other Clinician: ?Referring Provider: ?Treating Provider/Extender: [name] ?[name] ?Weeks in Treatment: 29 ?Debridement Performed for Assessment: Wound #18 Left,Plantar Metatarsal head first ?Performed By: Physician [name] Maudlin, MD ?Debridement Type: Debridement ?Severity of Tissue Pre Debridement: Fat layer exposed ?Level of Consciousness (Pre-procedure): Awake and Alert ?Pre-procedure Verification/Time Out Yes - 10:10 ?Taken: ?Start Time: 10:13 ?Pain Control: ?Other : benzocaine 20% spray ?T Area Debrided (L x W): ?otal 2.5 (cm) x 2.5 (cm) = 6.25 (cm?) ?Tissue and other material debrided: ?Non-Viable, Callus, Skin: Epidermis ?Level: Skin/Epidermis ?Debridement Description: Selective/Open Wound ?Instrument: Curette ?Bleeding: Minimum ?Hemostasis Achieved: Pressure ?Procedural Pain: 0 ?Post Procedural Pain: 0 ?Response to Treatment: Procedure was tolerated well ?Level of Consciousness (Post- Awake and Alert ?procedure): ?Post Debridement  Measurements of Total Wound ?Length: (cm) 1 ?Width: (cm) 1.5 ?Depth: (cm) 0.1 ?Volume: (cm?) 0.118 ?Character of Wound/Ulcer Post Debridement: Improved ?Severity of Tissue Post Debridement: Fat layer exposed ?Post Procedure Diagnosis ?Same as Pre-procedure ?Electronic Signature(s) ?Signed: 11/06/2021 11:55:28 AM By: [name] Maudlin MD [name] ?Signed: 11/06/2021 5:49:11 PM By: [name] ?Entered By: [name] Gouty on 11/06/2021 10:17:32 ?-------------------------------------------------------------------------------- ?HPI Details ?Patient Name: Date of Service: ?[name], [name] 11/06/2021 9:00 A M ?Medical Record Number: [id number] ?Patient Account Number: [id number] ?Date of Birth/Sex: Treating  RN: ?[birth year] (71 y.o. [name] ?Primary Care Provider: Jilda [name] Other Clinician: ?Referring Provider: ?Treating Provider/Extender: [name] ?[name] ?Weeks in Treatment: 29 ?History of Present Illness ?HPI Description: 10/11/17; [name] is a 71 year old man who tells me that in 2015 he slipped down the latter traumatizing his left leg. He developed a wound ?in the same spot the area that we are currently looking at. He states this closed over for the most part although he always felt it was somewhat unstable. In ?2016 he hit the same area with the door of his car had this reopened. He tells me that this is never really closed although sometimes an inflow it remains open ?on a constant basis. He has not been using any specific dressing to this except for topical antibiotics the nature of which were not really sure. ?His primary doctor did send him to see [name] of interventional cardiology. He underwent an angiogram on 08/06/17 and he underwent a PTA and directional ?atherectomy of the lesser distal SFA and popliteal arteries which resulted in brisk improvement in blood flow. It was noted that he had 2 vessel runoff through ?the anterior tibial and peroneal. He is also been to see  vascular and interventional radiologist. He was not felt to have any significant superficial venous ?insufficiency. Presumably is not a candidate for any ablation. It was suggested he come here for wound care. ?The patient is a type II diabetic on insulin. He also has a history of venous insufficiency. ?ABIs on the left were noncompressible in our clinic ?10/21/17; patient we admitted to the clinic last week. 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 We ?put Iodosorb on him after an aggressive debridement and 3 layer compression. He complained of pain in his ankle and itching with is skin in fact he scratched ?the area on the medial calf superiorly at the rim of our wraps and he has 2 small open areas in that location today which are new. I changed his primary ?dressing today to silver collagen. As noted he is already had revascularization and does not have any significant superficial venous insufficiency that would ?be amenable to ablation ?10/28/17; patient admitted to the clinic 2 weeks ago. He has a smaller Wound. Scratch injury from last week revealed. There is large wound over the tibial area. ?This is smaller. Granulation looks healthy. No need for debridement. ?11/04/17; the wound on the left lateral calf looks better. Improved dimensions. Surface of this looks better. We've been maintaining him and Kerlix Coban ?wraps. He finds this much more comfortable. Silver collagen dressing ?11/11/17; left lateral Wound continues to look healthy be making progress. Using a #5 curet I removed removed nonviable skin from the surface of the wound ?and then necrotic debris from the wound surface. Surface of the wound continues to look healthy. ?He also has an open area on the left great toenail bed. We've been using topical antibiotics. ?11/19/17; left anterior lateral wound continues to look healthy but it's not closed. ?He also had a small wound above this on  the left leg ?Initially traumatic wounds in the setting of significant chronic venous insufficiency and stasis dermatitis ?11/25/17; left anterior wounds superiorly is closed still a small wound inferiorly. ?12/02/17;

## 2021-11-06 NOTE — Progress Notes (Signed)
[name], [name] ([id number]) ?Visit Report for 11/06/2021 ?Arrival Information Details ?Patient Name: Date of Service: ?[name] [name] RRY W. 11/06/2021 9:00 A M ?Medical Record Number: [id number] ?Patient Account Number: [id number] ?Date of Birth/Sex: Treating RN: ?February 04, 1951 (71 y.o. [name] [name] ?Primary Care Sheilyn Boehlke: [name] Other Clinician: ?Referring Vincen Bejar: ?Treating Townes Fuhs/Extender: Fredirick Maudlin ?[name] ?Weeks in Treatment: 29 ?Visit Information History Since Last Visit ?Added or deleted any medications: No ?Patient Arrived: Ambulatory ?Any new allergies or adverse reactions: No ?Arrival Time: 09:35 ?Had a fall or experienced change in No ?Accompanied By: self ?activities of daily living that may affect ?Transfer Assistance: None ?risk of falls: ?Patient Identification Verified: Yes ?Signs or symptoms of abuse/neglect since last visito No ?Secondary Verification Process Completed: Yes ?Hospitalized since last visit: No ?Patient Requires Transmission-Based Precautions: No ?Implantable device outside of the clinic excluding No ?Patient Has Alerts: Yes ?cellular tissue based products placed in the center ?since last visit: ?Has Dressing in Place as Prescribed: Yes ?Has Compression in Place as Prescribed: Yes ?Pain Present Now: Yes ?Electronic Signature(s) ?Signed: 11/06/2021 5:49:11 PM By: Baruch Gouty RN, BSN ?Entered By: Baruch Gouty on 11/06/2021 09:36:47 ?-------------------------------------------------------------------------------- ?Compression Therapy Details ?Patient Name: Date of Service: ?[name] [name] RRY W. 11/06/2021 9:00 A M ?Medical Record Number: [id number] ?Patient Account Number: [id number] ?Date of Birth/Sex: Treating RN: ?1951-04-11 (71 y.o. [name] [name] ?Primary Care Kapena Hamme: [name] Other Clinician: ?Referring [place] Warriner: ?Treating Charmika Macdonnell/Extender: Fredirick Maudlin ?[name] ?Weeks in Treatment: 29 ?Compression Therapy Performed for Wound Assessment: Wound #22  Left,Lateral Lower Leg ?Performed By: Clinician Baruch Gouty, RN ?Compression Type: Double Layer ?Post Procedure Diagnosis ?Same as Pre-procedure ?Electronic Signature(s) ?Signed: 11/06/2021 5:49:11 PM By: Baruch Gouty RN, BSN ?Entered By: Baruch Gouty on 11/06/2021 09:58:12 ?-------------------------------------------------------------------------------- ?Encounter Discharge Information Details ?Patient Name: ?Date of Service: ?[name] [name] RRY W. 11/06/2021 9:00 A M ?Medical Record Number: [id number] ?Patient Account Number: [id number] ?Date of Birth/Sex: ?Treating RN: ?07-10-1950 (71 y.o. [name] [name] ?Primary Care Quatisha Zylka: [name] ?Other Clinician: ?Referring Izaias Krupka: ?Treating Mckennah Kretchmer/Extender: Fredirick Maudlin ?[name] ?Weeks in Treatment: 29 ?Encounter Discharge Information Items Post Procedure Vitals ?Discharge Condition: Stable ?Temperature (F): 98 ?Ambulatory Status: Ambulatory ?Pulse (bpm): 60 ?Discharge Destination: Home ?Respiratory Rate (breaths/min): 18 ?Transportation: Private Auto ?Blood Pressure (mmHg): 128/71 ?Accompanied By: self ?Schedule Follow-up Appointment: Yes ?Clinical Summary of Care: Patient Declined ?Electronic Signature(s) ?Signed: 11/06/2021 5:49:11 PM By: Baruch Gouty RN, BSN ?Entered By: Baruch Gouty on 11/06/2021 10:49:55 ?-------------------------------------------------------------------------------- ?Lower Extremity Assessment Details ?Patient Name: ?Date of Service: ?[name] [name] RRY W. 11/06/2021 9:00 A M ?Medical Record Number: [id number] ?Patient Account Number: [id number] ?Date of Birth/Sex: ?Treating RN: ?01/14/1951 (71 y.o. [name] [name] ?Primary Care Shawnae Leiva: [name] ?Other Clinician: ?Referring Hiroki Wint: ?Treating Ralyn Stlaurent/Extender: Fredirick Maudlin ?[name] ?Weeks in Treatment: 29 ?Edema Assessment ?Assessed: [Left: No] [Right: No] ?Edema: [Left: Ye] [Right: s] ?Calf ?Left: Right: ?Point of Measurement: 41 cm From Medial Instep 41  cm ?Ankle ?Left: Right: ?Point of Measurement: 10 cm From Medial Instep 27 cm ?Vascular Assessment ?Pulses: ?Dorsalis Pedis ?Palpable: [Left:Yes] ?Electronic Signature(s) ?Signed: 11/06/2021 5:49:11 PM By: Baruch Gouty RN, BSN ?Entered By: Baruch Gouty on 11/06/2021 09:51:58 ?-------------------------------------------------------------------------------- ?Multi Wound Chart Details ?Patient Name: ?Date of Service: ?[name] [name] RRY W. 11/06/2021 9:00 A M ?Medical Record Number: [id number] ?Patient Account Number: [id number] ?Date of Birth/Sex: ?Treating RN: ?01/04/1951 (71 y.o. [name] [name] ?Primary Care Deniece Rankin: [name] ?Other Clinician: ?Referring Montgomery Rothlisberger: ?Treating Izzy Doubek/Extender: Fredirick Maudlin ?[name] ?Weeks in Treatment: 29 ?Vital Signs ?Height(in): 74 ?Capillary  Blood Glucose(mg/dl): 240 ?Weight(lbs): 238 ?Pulse(bpm): 60 ?Body Mass Index(BMI): 30.6 ?Blood Pressure(mmHg): 128/71 ?Temperature(??F): 98 ?Respiratory Rate(breaths/min): 18 ?Photos: ?Left, Plantar Metatarsal head first Left, Lateral Lower Leg Left, Anterior Lower Leg ?Wound Location: ?Gradually Appeared Bump Gradually Appeared ?Wounding Event: ?Diabetic Wound/Ulcer of the Lower Cyst Venous Leg Ulcer ?Primary Etiology: ?Extremity ?N/A N/A Diabetic Wound/Ulcer of the Lower ?Secondary Etiology: ?Extremity ?Glaucoma, Sleep Apnea, Glaucoma, Sleep Apnea, Glaucoma, Sleep Apnea, ?Comorbid History: ?Hypertension, Peripheral Arterial Hypertension, Peripheral Arterial Hypertension, Peripheral Arterial ?Disease, Peripheral Venous Disease, Disease, Peripheral Venous Disease, Disease, Peripheral Venous Disease, ?Type II Diabetes, Gout, Osteoarthritis, Type II Diabetes, Gout, Osteoarthritis, Type II Diabetes, Gout, Osteoarthritis, ?Neuropathy Neuropathy Neuropathy ?08/23/2020 06/03/2021 11/06/2021 ?Date Acquired: ?78 22 0 ?Weeks of Treatment: ?Open Open Open ?Wound Status: ?No No No ?Wound Recurrence: ?1x1.5x0.1 16.2x5.3x3.2  1.2x1x0.1 ?Measurements L x W x D (cm) ?1.178 67.434 0.942 ?A (cm?) : ?rea ?0.118 215.79 0.094 ?Volume (cm?) : ?91.70% -3989.40% 0.00% ?% Reduction in A rea: ?95.80% -16260.10% 0.00% ?% Reduction in Volume: ?11 ?Position 1 (o'clock): ?7.2 ?Maximum Distance 1 (cm): ?No Yes No ?Tunneling: ?Grade 2 Full Thickness With Exposed Support Full Thickness Without Exposed ?Classification: ?Structures Support Structures ?Medium Large Medium ?Exudate A mount: ?Serosanguineous Serosanguineous Serous ?Exudate Type: ?red, brown red, brown amber ?Exudate Color: ?Thickened Distinct, outline attached Flat and Intact ?Wound Margin: ?Large (67-100%) Large (67-100%) Small (1-33%) ?Granulation A mount: ?Red Red Red ?Granulation Quality: ?None Present (0%) Small (1-33%) None Present (0%) ?Necrotic A mount: ?Fat Layer (Subcutaneous Tissue): Yes Fat Layer (Subcutaneous Tissue): Yes Fat Layer (Subcutaneous Tissue): Yes ?Exposed Structures: ?Fascia: No ?Fascia: No ?Fascia: No ?Tendon: No ?Tendon: No ?Tendon: No ?Muscle: No ?Muscle: No ?Muscle: No ?Joint: No ?Joint: No ?Joint: No ?Bone: No ?Bone: No ?Bone: No ?Small (1-33%) Small (1-33%) Large (67-100%) ?Epithelialization: ?Debridement - Selective/Open Wound N/A Debridement - Excisional ?Debridement: ?Pre-procedure Verification/Time Out 10:10 N/A 10:10 ?Taken: ?Other N/A Other ?Pain Control: ?Callus N/A Subcutaneous, Slough ?Tissue Debrided: ?Skin/Epidermis N/A Skin/Subcutaneous Tissue ?Level: ?6.25 N/A 1.2 ?Debridement A (sq cm): ?rea ?Curette N/A Curette ?Instrument: ?Minimum N/A Minimum ?Bleeding: ?Pressure N/A Pressure ?Hemostasis Achieved: ?0 N/A 0 ?Procedural Pain: ?0 N/A 0 ?Post Procedural Pain: ?Procedure was tolerated well N/A Procedure was tolerated well ?Debridement Treatment Response: ?1x1.5x0.1 N/A 1.2x1x0.1 ?Post Debridement Measurements L x ?W x D (cm) ?0.118 N/A 0.094 ?Post Debridement Volume: (cm?) ?Debridement Compression Therapy Debridement ?Procedures Performed: ?Negative  Pressure Wound Therapy ?Maintenance (NPWT) ?Wound Number: 26 N/A N/A ?Photos: No Photos N/A N/A ?Left, Medial Foot N/A N/A ?Wound Location: ?Gradually Appeared N/A N/A ?Wounding Event: ?Diabetic Wound/Ulcer of the Lower N/A N/

## 2021-11-08 ENCOUNTER — Ambulatory Visit (INDEPENDENT_AMBULATORY_CARE_PROVIDER_SITE_OTHER): Payer: Medicare Other | Admitting: Vascular Surgery

## 2021-11-08 ENCOUNTER — Encounter: Payer: Self-pay | Admitting: Vascular Surgery

## 2021-11-08 VITALS — BP 122/78 | HR 64 | Temp 98.2°F | Resp 20 | Ht 73.5 in | Wt 269.0 lb

## 2021-11-08 DIAGNOSIS — I739 Peripheral vascular disease, unspecified: Secondary | ICD-10-CM

## 2021-11-08 NOTE — Progress Notes (Signed)
?  ? ?  Subjective:  ?  ? Patient ID: [name], male   DOB: 08-Sep-1950, 71 y.o.   MRN: [id number] ? ?HPI 71 year old male status post I&D of left lateral leg removal of Viabahn stent.  He states that the wound is now beginning to have granulation although there is some tracking inferiorly as well.  He does not have any further fevers.  Wound VAC to be changed at the wound care center tomorrow. ? ? ?Review of Systems ?No complaints today ?   ?Objective:  ? Physical Exam ?Vitals:  ? 11/08/21 1403  ?BP: 122/78  ?Pulse: 64  ?Resp: 20  ?Temp: 98.2 ?F (36.8 ?C)  ?SpO2: 93%  ? ?Left lateral leg with wound VAC in place ?Stable swelling left lower extremity with compressive stocking in place ?   ?Assessment:  ?   ?70 year old male well-known to me after left femoral to anterior tibial artery bypass did have stenting of his left popliteal and SFA prior to my involvement in his care.  Viabahn has now been removed does have 6 x 120 Innova stent remaining that is known to be occluded.  Wound is now improving with the help of wound care. ?   ?Plan:  ?   ?Follow-up in 4 to 6 weeks for wound evaluation. ? ?At some point we may need to interrogate his left lower extremity Innova stent with CT angio particularly if he has nonhealing of his wound that stent will certainly need to be removed but I think would need to be from a medial approach where he has significant scar tissue from previous healing by secondary intention.  He is aware of the difficulty of the situation and hopefully his wound will heal without need for further surgery. ? ?Willia Lampert C. Donzetta Matters, MD ?[facility] ?Office: [phone] ?Pager: [phone] ? ?   ?

## 2021-11-09 ENCOUNTER — Encounter (HOSPITAL_BASED_OUTPATIENT_CLINIC_OR_DEPARTMENT_OTHER): Payer: Medicare Other | Admitting: General Surgery

## 2021-11-09 ENCOUNTER — Encounter: Payer: Self-pay | Admitting: Vascular Surgery

## 2021-11-09 DIAGNOSIS — E11621 Type 2 diabetes mellitus with foot ulcer: Secondary | ICD-10-CM | POA: Diagnosis not present

## 2021-11-10 NOTE — Progress Notes (Signed)
PASCUAL, MANTEL ([id number]) Visit Report for 11/09/2021 SuperBill Details Patient Name: Date of Service: [name], [name] 11/09/2021 Medical Record Number: [id number] Patient Account Number: [id number] Date of Birth/Sex: Treating RN: 08/07/1950 (71 y.o. [name] Primary Care Provider: [name] Other Clinician: Referring Provider: Treating Provider/Extender: [name] in Treatment: 30 Diagnosis Coding ICD-10 Codes Code Description E11.621 Type 2 diabetes mellitus with foot ulcer 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 I89.0 Lymphedema, not elsewhere classified I87.322 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verity 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 of other part of left foot with other specified severity Facility Procedures CPT4 Code Description Modifier Quantity [id number] 97606 - WOUND VAC-GREATER TH 50 SQ CM 1 [id number] (Facility Use Only) 29581LT - APPLY MULTLAY COMPRS LWR LT LEG 59 1 Electronic Signature(s) Signed: 11/09/2021 4:11:47 PM By: Fredirick Maudlin MD FACS Signed: 11/09/2021 4:54:14 PM By: Baruch Gouty RN, BSN Entered By: Baruch Gouty on 11/09/2021 15:57:18

## 2021-11-10 NOTE — Progress Notes (Signed)
TILDON, SILVERIA ([id number]) Visit Report for 11/09/2021 Arrival Information Details Patient Name: Date of Service: ELIOT, POPPER 11/09/2021 11:15 A M Medical Record Number: [id number] Patient Account Number: [id number] Date of Birth/Sex: Treating RN: 1951/02/27 (71 y.o. [name] Primary Care Kambree Krauss: [name] Other Clinician: Referring Chalsey Leeth: Treating Sherilynn Dieu/Extender: [name] in Treatment: 82 Visit Information History Since Last Visit Added or deleted any medications: No Patient Arrived: Ambulatory Any new allergies or adverse reactions: No Arrival Time: 11:47 Had a fall or experienced change in No Accompanied By: self activities of daily living that may affect Transfer Assistance: None risk of falls: Patient Identification Verified: Yes Signs or symptoms of abuse/neglect since last visito No Secondary Verification Process Completed: Yes Hospitalized since last visit: No Patient Requires Transmission-Based Precautions: No Implantable device outside of the clinic excluding No Patient Has Alerts: Yes cellular tissue based products placed in the center since last visit: Has Dressing in Place as Prescribed: Yes Has Compression in Place as Prescribed: Yes Pain Present Now: No Electronic Signature(s) Signed: 11/09/2021 4:54:14 PM By: Baruch Gouty RN, BSN Entered By: Baruch Gouty on 11/09/2021 11:47:48 -------------------------------------------------------------------------------- Compression Therapy Details Patient Name: Date of Service: [name] 11/09/2021 11:15 A M Medical Record Number: [id number] Patient Account Number: [id number] Date of Birth/Sex: Treating RN: [birth year] (71 y.o. [name] Primary Care Delailah Spieth: [name] Other Clinician: Referring Lakesia Dahle: Treating Ryllie Nieland/Extender: [name] in Treatment: 30 Compression Therapy Performed for Wound Assessment: Wound #25  Left,Anterior Lower Leg Performed By: Clinician Baruch Gouty, RN Compression Type: Double Layer Electronic Signature(s) Signed: 11/09/2021 4:54:14 PM By: Baruch Gouty RN, BSN Entered By: Baruch Gouty on 11/09/2021 15:55:44 -------------------------------------------------------------------------------- Encounter Discharge Information Details Patient Name: Date of Service: [name] 11/09/2021 11:15 A M Medical Record Number: [id number] Patient Account Number: [id number] Date of Birth/Sex: Treating RN: September 12, 1950 (71 y.o. [name] Primary Care Josian Lanese: [name] Other Clinician: Referring Adler Alton: Treating Yumna Ebers/Extender: [name] in Treatment: 30 Encounter Discharge Information Items Discharge Condition: Stable Ambulatory Status: Ambulatory Discharge Destination: Home Transportation: Private Auto Accompanied By: self Schedule Follow-up Appointment: Yes Clinical Summary of Care: Patient Declined Electronic Signature(s) Signed: 11/09/2021 4:54:14 PM By: Baruch Gouty RN, BSN Entered By: Baruch Gouty on 11/09/2021 15:57:01 -------------------------------------------------------------------------------- Negative Pressure Wound Therapy Maintenance (NPWT) Details Patient Name: Date of Service: BRAIAN, TIJERINA 11/09/2021 11:15 A M Medical Record Number: [id number] Patient Account Number: [id number] Date of Birth/Sex: Treating RN: 05/17/1951 (71 y.o. [name] Primary Care Annamaria Salah: [name] Other Clinician: Referring Artice Bergerson: Treating Eathen Budreau/Extender: [name] in Treatment: 30 NPWT Maintenance Performed for: Wound #22 Left, Lateral Lower Leg Additional Injuries Covered: No Performed By: Baruch Gouty, RN Type: VAC System Coverage Size (sq cm): 85.86 Pressure Type: Constant Pressure Setting: 125 mmHG Drain Type: None Sponge/Dressing Type: Combination : white and black  foam Date Initiated: 07/27/2021 Dressing Removed: No Quantity of Sponges/Gauze Removed: 2 Canister Changed: Yes Canister Exudate Volume: 250 Dressing Reapplied: No Quantity of Sponges/Gauze Inserted: 2 Respones T Treatment: o good Days On NPWT : 106 Electronic Signature(s) Signed: 11/09/2021 4:54:14 PM By: Baruch Gouty RN, BSN Entered By: Baruch Gouty on 11/09/2021 15:55:19 -------------------------------------------------------------------------------- Patient/Caregiver Education Details Patient Name: Date of Service: [name] 5/18/2023andnbsp11:15 A M Medical Record Number: [id number] Patient Account Number: [id number] Date of Birth/Gender: Treating RN: [birth year] (71 y.o. [name] Primary Care Physician: [name] Other Clinician: Referring Physician: Treating  Physician/Extender: [name] in Treatment: 39 Education Assessment Education Provided To: Patient Education Topics Provided Elevated Blood Sugar/ Impact on Healing: Methods: Explain/Verbal Responses: Reinforcements needed, State content correctly Offloading: Methods: Explain/Verbal Responses: Reinforcements needed, State content correctly Venous: Methods: Explain/Verbal Responses: Reinforcements needed, State content correctly Wound/Skin Impairment: Methods: Explain/Verbal Responses: Reinforcements needed, State content correctly Electronic Signature(s) Signed: 11/09/2021 4:54:14 PM By: Baruch Gouty RN, BSN Entered By: Baruch Gouty on 11/09/2021 15:56:43 -------------------------------------------------------------------------------- Wound Assessment Details Patient Name: Date of Service: [name] 11/09/2021 11:15 A M Medical Record Number: [id number] Patient Account Number: [id number] Date of Birth/Sex: Treating RN: 05-11-1951 (71 y.o. [name] Primary Care Jezabel Lecker: [name] Other Clinician: Referring Mclain Freer: Treating  Jorie Zee/Extender: [name] in Treatment: 30 Wound Status Wound Number: 18 Primary Etiology: Diabetic Wound/Ulcer of the Lower Extremity Wound Location: Left, Plantar Metatarsal head first Wound Status: Open Wounding Event: Gradually Appeared Date Acquired: 08/23/2020 Weeks Of Treatment: 30 Clustered Wound: No Wound Measurements Length: (cm) 1 Width: (cm) 1.5 Depth: (cm) 0.1 Area: (cm) 1.178 Volume: (cm) 0.118 % Reduction in Area: 91.7% % Reduction in Volume: 95.8% Wound Description Classification: Grade 2 Exudate Amount: Medium Exudate Type: Serosanguineous Exudate Color: red, brown Treatment Notes Wound #18 (Metatarsal head first) Wound Laterality: Plantar, Left Cleanser Soap and Water Discharge Instruction: May shower and wash wound with dial antibacterial soap and water prior to dressing change. Wound Cleanser Discharge Instruction: Cleanse the wound with wound cleanser prior to applying a clean dressing using gauze sponges, not tissue or cotton balls. Peri-Wound Care Triamcinolone 15 (g) Discharge Instruction: Use triamcinolone 15 (g) as directed Sween Lotion (Moisturizing lotion) Discharge Instruction: Apply moisturizing lotion as directed Topical Primary Dressing Promogran Prisma Matrix, 4.34 (sq in) (silver collagen) Discharge Instruction: Moisten collagen with saline or hydrogel Secondary Dressing Woven Gauze Sponge, Non-Sterile 4x4 in Discharge Instruction: Apply over primary dressing as directed. Secured With 33M Medipore H Soft Cloth Surgical T ape, 4 x 10 (in/yd) Discharge Instruction: Secure with tape as directed. Compression Wrap CoFlex TLC XL 2-layer Compression System 4x7 (in/yd) Discharge Instruction: Apply CoFlex 2-layer compression as directed. (alt for 4 layer) Compression Stockings Add-Ons Electronic Signature(s) Signed: 11/09/2021 4:54:14 PM By: Baruch Gouty RN, BSN Entered By: Baruch Gouty on 11/09/2021  15:51:30 -------------------------------------------------------------------------------- Wound Assessment Details Patient Name: Date of Service: [name] 11/09/2021 11:15 A M Medical Record Number: [id number] Patient Account Number: [id number] Date of Birth/Sex: Treating RN: 29-Jul-1950 (71 y.o. [name] Primary Care Tawanna Funk: [name] Other Clinician: Referring Wilkie Zenon: Treating Quintella Mura/Extender: [name] in Treatment: 30 Wound Status Wound Number: 22 Primary Etiology: Cyst Wound Location: Left, Lateral Lower Leg Wound Status: Open Wounding Event: Bump Date Acquired: 06/03/2021 Weeks Of Treatment: 22 Clustered Wound: No Wound Measurements Length: (cm) 16.2 Width: (cm) 5.3 Depth: (cm) 3.2 Area: (cm) 67.434 Volume: (cm) 215.79 % Reduction in Area: -3989.4% % Reduction in Volume: -16260.1% Wound Description Classification: Full Thickness With Exposed Support Structure Exudate Amount: Large Exudate Type: Serosanguineous Exudate Color: red, brown Treatment Notes Wound #22 (Lower Leg) Wound Laterality: Left, Lateral Cleanser Soap and Water Discharge Instruction: May shower and wash wound wit Wound Cleanser Discharge Instruction: Cleanse the wound with wound balls. s h dial antibacterial soap and water prior to dressing change. cleanser prior to applying a clean dressing using gauze sponges, not tissue or cotton Peri-Wound Care Triamcinolone 15 (g) Discharge Instruction: Use triamcinolone 15 (g) as directed Sween Lotion (Moisturizing lotion) Discharge Instruction:  Apply moisturizing lotion as directed Topical Primary Dressing VAC Secondary Dressing Secured With Compression Wrap CoFlex TLC XL 2-layer Compression System 4x7 (in/yd) Discharge Instruction: Apply CoFlex 2-layer compression as directed. (alt for 4 layer) Compression Stockings Add-Ons Electronic Signature(s) Signed: 11/09/2021 4:54:14 PM By:  Baruch Gouty RN, BSN Entered By: Baruch Gouty on 11/09/2021 15:51:30 -------------------------------------------------------------------------------- Wound Assessment Details Patient Name: Date of Service: [name] 11/09/2021 11:15 A M Medical Record Number: [id number] Patient Account Number: [id number] Date of Birth/Sex: Treating RN: [birth year] (71 y.o. [name] Primary Care Terence Bart: [name] Other Clinician: Referring Hiba Garry: Treating Wilmore Holsomback/Extender: [name] in Treatment: 30 Wound Status Wound Number: 25 Primary Etiology: Venous Leg Ulcer Wound Location: Left, Anterior Lower Leg Secondary Etiology: Diabetic Wound/Ulcer of the Lower Extremity Wounding Event: Gradually Appeared Wound Status: Open Date Acquired: 11/06/2021 Weeks Of Treatment: 0 Clustered Wound: No Wound Measurements Length: (cm) 1.2 Width: (cm) 1 Depth: (cm) 0.1 Area: (cm) 0.942 Volume: (cm) 0.094 % Reduction in Area: 0% % Reduction in Volume: 0% Wound Description Classification: Full Thickness Without Exposed Support Structures Exudate Amount: Medium Exudate Type: Serous Exudate Color: amber Treatment Notes Wound #25 (Lower Leg) Wound Laterality: Left, Anterior Cleanser Peri-Wound Care Sween Lotion (Moisturizing lotion) Discharge Instruction: Apply moisturizing lotion as directed Topical Primary Dressing KerraCel Ag Gelling Fiber Dressing, 2x2 in (silver alginate) Discharge Instruction: Apply silver alginate to wound bed as instructed Secondary Dressing Woven Gauze Sponge, Non-Sterile 4x4 in Discharge Instruction: Apply over primary dressing as directed. Secured With Compression Wrap CoFlex TLC XL 2-layer Compression System 4x7 (in/yd) Discharge Instruction: Apply CoFlex 2-layer compression as directed. (alt for 4 layer) Compression Stockings Add-Ons Electronic Signature(s) Signed: 11/09/2021 4:54:14 PM By: Baruch Gouty RN,  BSN Entered By: Baruch Gouty on 11/09/2021 15:51:30 -------------------------------------------------------------------------------- Wound Assessment Details Patient Name: Date of Service: [name] 11/09/2021 11:15 A M Medical Record Number: [id number] Patient Account Number: [id number] Date of Birth/Sex: Treating RN: [birth year] (71 y.o. [name] Primary Care Karra Pink: [name] Other Clinician: Referring Erine Phenix: Treating Julann Mcgilvray/Extender: [name] in Treatment: 30 Wound Status Wound Number: 26 Primary Etiology: Diabetic Wound/Ulcer of the Lower Extremity Wound Location: Left, Medial Foot Wound Status: Open Wounding Event: Gradually Appeared Date Acquired: 11/06/2021 Weeks Of Treatment: 0 Clustered Wound: No Wound Measurements Length: (cm) 0.3 Width: (cm) 0.6 Depth: (cm) 0.1 Area: (cm) 0.141 Volume: (cm) 0.014 % Reduction in Area: 0% % Reduction in Volume: 0% Wound Description Classification: Grade 1 Exudate Amount: Medium Exudate Type: Serosanguineous Exudate Color: red, brown Treatment Notes Wound #26 (Foot) Wound Laterality: Left, Medial Cleanser Soap and Water Discharge Instruction: May shower and wash wound w Wound Cleanser Discharge Instruction: Cleanse the wound with woun balls. ith dial antibacterial soap and water prior to dressing change. d cleanser prior to applying a clean dressing using gauze sponges, not tissue or cotton Peri-Wound Care Triamcinolone 15 (g) Discharge Instruction: Use triamcinolone 15 (g) as directed Sween Lotion (Moisturizing lotion) Discharge Instruction: Apply moisturizing lotion as directed Topical Primary Dressing Promogran Prisma Matrix, 4.34 (sq in) (silver collagen) Discharge Instruction: Moisten collagen with saline or hydrogel Secondary Dressing Woven Gauze Sponge, Non-Sterile 4x4 in Discharge Instruction: Apply over primary dressing as directed. Secured With 8M  Medipore H Soft Cloth Surgical T ape, 4 x 10 (in/yd) Discharge Instruction: Secure with tape as directed. Compression Wrap CoFlex TLC XL 2-layer Compression System 4x7 (in/yd) Discharge Instruction: Apply CoFlex 2-layer compression as directed. (alt for 4 layer) Compression Stockings Add-Ons  Electronic Signature(s) Signed: 11/09/2021 4:54:14 PM By: Baruch Gouty RN, BSN Entered By: Baruch Gouty on 11/09/2021 15:51:30 -------------------------------------------------------------------------------- Vitals Details Patient Name: Date of Service: [name] 11/09/2021 11:15 A M Medical Record Number: [id number] Patient Account Number: [id number] Date of Birth/Sex: Treating RN: 1951-02-15 (71 y.o. [name] Primary Care Bernadean Saling: [name] Other Clinician: Referring Cherokee Clowers: Treating Morelia Cassells/Extender: [name] in Treatment: 30 Vital Signs Time Taken: 11:48 Temperature (F): 98.2 Height (in): 74 Pulse (bpm): 61 Weight (lbs): 238 Respiratory Rate (breaths/min): 18 Body Mass Index (BMI): 30.6 Blood Pressure (mmHg): 152/82 Capillary Blood Glucose (mg/dl): 348 Reference Range: 80 - 120 mg / dl Notes glucose at present per pt's meter. pt states forgot to do insulin bolus Electronic Signature(s) Signed: 11/09/2021 4:54:14 PM By: Baruch Gouty RN, BSN Entered By: Baruch Gouty on 11/09/2021 11:50:27

## 2021-11-13 ENCOUNTER — Encounter (HOSPITAL_BASED_OUTPATIENT_CLINIC_OR_DEPARTMENT_OTHER): Payer: Medicare Other | Admitting: General Surgery

## 2021-11-13 DIAGNOSIS — E11621 Type 2 diabetes mellitus with foot ulcer: Secondary | ICD-10-CM | POA: Diagnosis not present

## 2021-11-13 NOTE — Progress Notes (Signed)
FITZGERALD, DUNNE ([id number]) Visit Report for 11/13/2021 Arrival Information Details Patient Name: Date of Service: [name], [name] 11/13/2021 11:00 A M Medical Record Number: [id number] Patient Account Number: [id number] Date of Birth/Sex: Treating RN: Feb 02, 1951 (71 y.o. [name] Primary Care [name]: [name] Other Clinician: Referring [name]: Treating [name]/Extender: [name] in Treatment: 48 Visit Information History Since Last Visit Added or deleted any medications: No Patient Arrived: Ambulatory Any new allergies or adverse reactions: No Arrival Time: 11:08 Had a fall or experienced change in No Accompanied By: self activities of daily living that may affect Transfer Assistance: None risk of falls: Patient Requires Transmission-Based Precautions: No Signs or symptoms of abuse/neglect since No Patient Has Alerts: Yes last visito Hospitalized since last visit: No Implantable device outside of the clinic No excluding cellular tissue based products placed in the center since last visit: Has Dressing in Place as Prescribed: Yes Has Compression in Place as Prescribed: Yes Has Footwear/Offloading in Place as Yes Prescribed: Left: Surgical Shoe with Pressure Relief Insole Pain Present Now: No Electronic Signature(s) Signed: 11/13/2021 4:58:18 PM By: [name] Gouty RN, BSN Entered By: [name] on 11/13/2021 11:10:09 -------------------------------------------------------------------------------- Compression Therapy Details Patient Name: Date of Service: [name]. 11/13/2021 11:00 A M Medical Record Number: [id number] Patient Account Number: [id number] Date of Birth/Sex: Treating RN: 10/20/1950 (71 y.o. [name] Primary Care [name]: [name] Other Clinician: Referring [name]: Treating [name]/Extender: [name] in Treatment: 30 Compression Therapy Performed for Wound  Assessment: Wound #22 Left,Lateral Lower Leg Performed By: Clinician [name] Gouty, RN Compression Type: Double Layer Post Procedure Diagnosis Same as Pre-procedure Electronic Signature(s) Signed: 11/13/2021 4:58:18 PM By: [name] Gouty RN, BSN Entered By: [name] on 11/13/2021 11:26:41 -------------------------------------------------------------------------------- Encounter Discharge Information Details Patient Name: Date of Service: [name]. 11/13/2021 11:00 A M Medical Record Number: [id number] Patient Account Number: [id number] Date of Birth/Sex: Treating RN: 1951/06/14 (71 y.o. [name] Primary Care [name]: [name] Other Clinician: Referring [name]: Treating [name]/Extender: [name] in Treatment: 30 Encounter Discharge Information Items Post Procedure Vitals Discharge Condition: Stable Temperature (F): 98.2 Ambulatory Status: Ambulatory Pulse (bpm): 67 Discharge Destination: Home Respiratory Rate (breaths/min): 18 Transportation: Private Auto Blood Pressure (mmHg): 153/87 Accompanied By: self Schedule Follow-up Appointment: Yes Clinical Summary of Care: Patient Declined Electronic Signature(s) Signed: 11/13/2021 4:58:18 PM By: [name] Gouty RN, BSN Entered By: [name] on 11/13/2021 11:59:01 -------------------------------------------------------------------------------- Lower Extremity Assessment Details Patient Name: Date of Service: [name]. 11/13/2021 11:00 A M Medical Record Number: [id number] Patient Account Number: [id number] Date of Birth/Sex: Treating RN: 12/01/1950 (71 y.o. [name] Primary Care [name]: [name] Other Clinician: Referring [name] Devera: Treating [name]/Extender: [name] in Treatment: 30 Edema Assessment Assessed: [name]: No] [name]: No] Edema: [Left: Ye] [Right: s] Calf Left: Right: Point of Measurement: 41 cm  From Medial Instep 44 cm Ankle Left: Right: Point of Measurement: 10 cm From Medial Instep 28 cm Vascular Assessment Pulses: Dorsalis Pedis Palpable: [Left:Yes] Electronic Signature(s) Signed: 11/13/2021 4:58:18 PM By: [name] Gouty RN, BSN Entered By: [name] on 11/13/2021 11:16:37 -------------------------------------------------------------------------------- Multi Wound Chart Details Patient Name: Date of Service: [name]. 11/13/2021 11:00 A M Medical Record Number: [id number] Patient Account Number: [id number] Date of Birth/Sex: Treating RN: [birth year] (71 y.o. 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 Primary Etiology: Extremity N/A N/A Diabetic Wound/Ulcer of the Lower Secondary Etiology: Extremity Glaucoma, Sleep Apnea, Glaucoma, Sleep Apnea, Glaucoma, Sleep Apnea, Comorbid History: Hypertension, Peripheral Arterial Hypertension, Peripheral Arterial Hypertension, Peripheral Arterial Disease, Peripheral Venous Disease, Disease, Peripheral Venous Disease, Disease, Peripheral Venous Disease, Type II Diabetes, Gout, Osteoarthritis, Type II Diabetes, Gout, Osteoarthritis, Type II Diabetes, Gout, Osteoarthritis, Neuropathy Neuropathy Neuropathy 08/23/2020 06/03/2021 11/06/2021 Date Acquired: 30 23 1  Weeks of Treatment: Open Open Open Wound Status: No No No Wound Recurrence: 0.9x1.2x0.1  16.5x4.1x3 0x0x0 Measurements L x W x D (cm) 0.848 53.132 0 A (cm) : rea 0.085 159.397 0 Volume (cm) : 94.00% -3122.10% 100.00% % Reduction in A rea: 97.00% -11984.70% 100.00% % Reduction in Volume: 11 Position 1 (o'clock): 4.5 Maximum Distance 1 (cm): No Yes No Tunneling: Grade 2 Full Thickness With Exposed Support Full Thickness Without Exposed Classification: Structures Support Structures Medium Large None Present Exudate A mount: Serosanguineous Serosanguineous N/A Exudate Type: red, brown red, brown N/A Exudate Color: Flat and Intact Distinct, outline attached N/A Wound Margin: Large (67-100%) Large (67-100%) None Present (0%) Granulation A mount: Red Red N/A Granulation Quality: None Present (0%) None Present (0%) None Present (0%) Necrotic A mount: Fat Layer (Subcutaneous Tissue): Yes Fat Layer (Subcutaneous Tissue): Yes Fascia: No Exposed Structures: Fascia: No Fascia: No Fat Layer (Subcutaneous Tissue): No Tendon: No Tendon: No Tendon: No Muscle: No Muscle: No Muscle: No Joint: No Joint: No Joint: No Bone: No Bone: No Bone: No Small (1-33%) Small (1-33%) Large (67-100%) Epithelialization: Debridement - Excisional N/A N/A Debridement: Pre-procedure Verification/Time Out 11:25 N/A N/A Taken: Subcutaneous, Slough N/A N/A Tissue Debrided: Skin/Subcutaneous Tissue N/A N/A Level: 1.08 N/A N/A Debridement A (sq cm): rea Curette N/A N/A Instrument: Minimum N/A N/A Bleeding: Pressure N/A N/A Hemostasis A chieved: 0 N/A N/A Procedural Pain: 0 N/A N/A Post Procedural Pain: Procedure was tolerated well N/A N/A Debridement Treatment Response: 0.9x1.2x0.1 N/A N/A Post Debridement Measurements L x W x D (cm) 0.085 N/A N/A Post Debridement Volume: (cm) N/A N/A N/A Assessment Notes: Debridement Compression Therapy N/A Procedures Performed: Wound Number: 26 N/A N/A Photos: N/A N/A Left, Medial Foot N/A N/A Wound Location: Gradually  Appeared N/A N/A Wounding Event: Diabetic Wound/Ulcer of the Lower N/A N/A Primary Etiology: Extremity N/A N/A N/A Secondary Etiology: Glaucoma, Sleep Apnea, N/A N/A Comorbid History: Hypertension, Peripheral Arterial Disease, Peripheral Venous Disease, Type II Diabetes, Gout, Osteoarthritis, Neuropathy 11/06/2021 N/A N/A Date A cquired: 1 N/A N/A Weeks of Treatment: Open N/A N/A Wound Status: No N/A N/A Wound Recurrence: 0x0x0 N/A N/A Measurements L x W x D (cm) 0 N/A N/A A (cm) : rea 0 N/A N/A Volume (cm) : 100.00% N/A N/A % Reduction in A rea: 100.00% N/A N/A % Reduction in Volume: No N/A N/A Tunneling: Grade 1 N/A N/A Classification: None Present N/A N/A Exudate A mount: N/A N/A N/A Exudate Type: N/A N/A N/A Exudate Color: N/A N/A N/A Wound Margin: None Present (0%) N/A N/A Granulation A mount: N/A N/A N/A Granulation Quality: None Present (0%) N/A N/A Necrotic A mount: Fascia: No N/A N/A Exposed Structures: Fat Layer (Subcutaneous Tissue): No Tendon: No Muscle: No Joint: No Bone: No  Large (67-100%) N/A N/A Epithelialization: N/A N/A N/A Debridement: N/A N/A N/A Tissue Debrided: N/A N/A N/A Level: N/A N/A N/A Debridement A (sq cm): rea N/A N/A N/A Instrument: N/A N/A N/A Bleeding: N/A N/A N/A Hemostasis A chieved: N/A N/A N/A Procedural Pain: N/A N/A N/A Post Procedural Pain: Debridement Treatment Response: N/A N/A N/A Post Debridement Measurements L x N/A N/A N/A W x D (cm) N/A N/A N/A Post Debridement Volume: (cm) scabbed N/A N/A Assessment Notes: N/A N/A N/A Procedures Performed: Treatment Notes Electronic Signature(s) Signed: 11/13/2021 11:39:54 AM By: Fredirick Maudlin MD FACS Signed: 11/13/2021 4:58:18 PM By: [name] Gouty RN, BSN Entered By: Fredirick Maudlin on 11/13/2021 11:39:54 -------------------------------------------------------------------------------- Multi-Disciplinary Care Plan Details Patient  Name: Date of Service: [name]. 11/13/2021 11:00 A M Medical Record Number: [id number] Patient Account Number: [id number] Date of Birth/Sex: Treating RN: 1951-04-07 (71 y.o. [name] Primary Care Vinita Prentiss: [name] Other Clinician: Referring Laverne Klugh: Treating Cyndee Giammarco/Extender: [name] in Treatment: 40 Multidisciplinary Care Plan reviewed with physician Active Inactive Venous Leg Ulcer Nursing Diagnoses: Knowledge deficit related to disease process and management Potential for venous Insuffiency (use before diagnosis confirmed) Goals: Patient will maintain optimal edema control Date Initiated: 07/27/2021 Target Resolution Date: 12/14/2021 Goal Status: Active Interventions: Assess peripheral edema status every visit. Treatment Activities: Therapeutic compression applied : 07/27/2021 Notes: Wound/Skin Impairment Nursing Diagnoses: Impaired tissue integrity Knowledge deficit related to ulceration/compromised skin integrity Goals: Patient will have a decrease in wound volume by X% from date: (specify in notes) Date Initiated: 04/12/2021 Date Inactivated: 04/27/2021 Target Resolution Date: 04/23/2021 Goal Status: Met Patient/caregiver will verbalize understanding of skin care regimen Date Initiated: 04/12/2021 Target Resolution Date: 12/14/2021 Goal Status: Active Ulcer/skin breakdown will have a volume reduction of 30% by week 4 Date Initiated: 04/12/2021 Date Inactivated: 04/27/2021 Target Resolution Date: 04/27/2021 Goal Status: Unmet Unmet Reason: infection Ulcer/skin breakdown will have a volume reduction of 50% by week 8 Date Initiated: 04/27/2021 Date Inactivated: 06/29/2021 Target Resolution Date: 06/24/2021 Goal Status: Met Interventions: Assess patient/caregiver ability to obtain necessary supplies Assess patient/caregiver ability to perform ulcer/skin care regimen upon admission and as needed Assess ulceration(s) every  visit Notes: Electronic Signature(s) Signed: 11/13/2021 4:58:18 PM By: [name] Gouty RN, BSN Entered By: [name] on 11/13/2021 11:20:08 -------------------------------------------------------------------------------- Pain Assessment Details Patient Name: Date of Service: [name]. 11/13/2021 11:00 A M Medical Record Number: [id number] Patient Account Number: [id number] Date of Birth/Sex: Treating RN: 05-22-1951 (71 y.o. [name] Primary Care Montavious Wierzba: [name] Other Clinician: Referring Betzaida Cremeens: Treating Ilia Dimaano/Extender: [name] in Treatment: 30 Active Problems Location of Pain Severity and Description of Pain Patient Has Paino No Site Locations Rate the pain. Current Pain Level: 0 Pain Management and Medication Current Pain Management: Electronic Signature(s) Signed: 11/13/2021 4:58:18 PM By: [name] Gouty RN, BSN Entered By: [name] on 11/13/2021 11:11:50 -------------------------------------------------------------------------------- Patient/Caregiver Education Details Patient Name: Date of Service: [name], [name] 5/22/2023andnbsp11:00 A M Medical Record Number: [id number] Patient Account Number: [id number] Date of Birth/Gender: Treating RN: 1951/03/16 (71 y.o. [name] Primary Care Physician: [name] Other Clinician: Referring Physician: Treating Physician/Extender: [name] in Treatment: 30 Education Assessment Education Provided To: Patient Education Topics Provided Venous: Methods: Explain/Verbal Responses: Reinforcements needed, State content correctly Wound/Skin Impairment: Methods: Explain/Verbal Responses: Reinforcements needed, State content correctly Electronic Signature(s) Signed: 11/13/2021 4:58:18 PM By: [name] Gouty RN, BSN Entered By: [name] on 11/13/2021  11:20:29 -------------------------------------------------------------------------------- Wound Assessment Details  Patient Name: Date of Service: [name], [name] 11/13/2021 11:00 A M Medical Record Number: [id number] Patient Account Number: [id number] Date of Birth/Sex: Treating RN: 1951-02-21 (71 y.o. [name] Primary Care Carmina Walle: [name] Other Clinician: Referring Jedrek Dinovo: Treating Airis Barbee/Extender: [name] in Treatment: 30 Wound Status Wound Number: 18 Primary Diabetic Wound/Ulcer of the Lower Extremity Etiology: Wound Location: Left, Plantar Metatarsal head first Wound Open Wounding Event: Gradually Appeared Status: Date Acquired: 08/23/2020 Comorbid Glaucoma, Sleep Apnea, Hypertension, Peripheral Arterial Disease, Weeks Of Treatment: 30 History: Peripheral Venous Disease, Type II Diabetes, Gout, Osteoarthritis, Clustered Wound: No Neuropathy Photos Wound Measurements Length: (cm) 0.9 Width: (cm) 1.2 Depth: (cm) 0.1 Area: (cm) 0.848 Volume: (cm) 0.085 % Reduction in Area: 94% % Reduction in Volume: 97% Epithelialization: Small (1-33%) Tunneling: No Undermining: No Wound Description Classification: Grade 2 Wound Margin: Flat and Intact Exudate Amount: Medium Exudate Type: Serosanguineous Exudate Color: red, brown Foul Odor After Cleansing: No Slough/Fibrino No Wound Bed Granulation Amount: Large (67-100%) Exposed Structure Granulation Quality: Red Fascia Exposed: No Necrotic Amount: None Present (0%) Fat Layer (Subcutaneous Tissue) Exposed: Yes Tendon Exposed: No Muscle Exposed: No Joint Exposed: No Bone Exposed: No Treatment Notes Wound #18 (Metatarsal head first) Wound Laterality: Plantar, Left Cleanser Soap and Water Discharge Instruction: May shower and wash wound with dial antibacterial soap and water prior to dressing change. Wound Cleanser Discharge Instruction: Cleanse the wound with wound cleanser  prior to applying a clean dressing using gauze sponges, not tissue or cotton balls. Peri-Wound Care Triamcinolone 15 (g) Discharge Instruction: Use triamcinolone 15 (g) as directed Sween Lotion (Moisturizing lotion) Discharge Instruction: Apply moisturizing lotion as directed Topical Primary Dressing Promogran Prisma Matrix, 4.34 (sq in) (silver collagen) Discharge Instruction: Moisten collagen with saline or hydrogel Secondary Dressing Optifoam Non-Adhesive Dressing, 4x4 in Discharge Instruction: Apply over primary dressing as directed. Woven Gauze Sponge, Non-Sterile 4x4 in Discharge Instruction: Apply over primary dressing as directed. Secured With 64M Medipore H Soft Cloth Surgical T ape, 4 x 10 (in/yd) Discharge Instruction: Secure with tape as directed. Compression Wrap CoFlex TLC XL 2-layer Compression System 4x7 (in/yd) Discharge Instruction: Apply CoFlex 2-layer compression as directed. (alt for 4 layer) Compression Stockings Add-Ons Electronic Signature(s) Signed: 11/13/2021 4:58:18 PM By: [name] Gouty RN, BSN Entered By: [name] on 11/13/2021 11:17:26 -------------------------------------------------------------------------------- Wound Assessment Details Patient Name: Date of Service: [name]. 11/13/2021 11:00 A M Medical Record Number: [id number] Patient Account Number: [id number] Date of Birth/Sex: Treating RN: 20-Jul-1950 (71 y.o. [name] Primary Care Evelean Bigler: [name] Other Clinician: Referring Errin Whitelaw: Treating Eleanora Guinyard/Extender: [name] in Treatment: 30 Wound Status Wound Number: 22 Primary Cyst Etiology: Wound Location: Left, Lateral Lower Leg Wound Open Wounding Event: Bump Status: Date Acquired: 06/03/2021 Comorbid Glaucoma, Sleep Apnea, Hypertension, Peripheral Arterial Disease, Weeks Of Treatment: 23 History: Peripheral Venous Disease, Type II Diabetes, Gout, Osteoarthritis, Clustered  Wound: No Neuropathy Photos Wound Measurements Length: (cm) 16.5 Width: (cm) 4.1 Depth: (cm) 3 Area: (cm) 53.132 Volume: (cm) 159.397 % Reduction in Area: -3122.1% % Reduction in Volume: -11984.7% Epithelialization: Small (1-33%) Tunneling: Yes Position (o'clock): 11 Maximum Distance: (cm) 4.5 Undermining: No Wound Description Classification: Full Thickness With Exposed Support Structures Wound Margin: Distinct, outline attached Exudate Amount: Large Exudate Type: Serosanguineous Exudate Color: red, brown Foul Odor After Cleansing: No Slough/Fibrino No Wound Bed Granulation Amount: Large (67-100%) Exposed Structure Granulation Quality: Red Fascia Exposed: No Necrotic Amount: None Present (0%) Fat Layer (Subcutaneous Tissue) Exposed: Yes  Tendon Exposed: No Muscle Exposed: No Joint Exposed: No Bone Exposed: No Treatment Notes Wound #22 (Lower Leg) Wound Laterality: Left, Lateral Cleanser Soap and Water Discharge Instruction: May shower and wash wound with dial antibacterial soap and water prior to dressing change. Wound Cleanser Discharge Instruction: Cleanse the wound with wound cleanser prior to applying a clean dressing using gauze sponges, not tissue or cotton balls. Peri-Wound Care Topical Primary Dressing Kerlix AMD Roll Dressing, 4.5x 4.1 (in/in) Discharge Instruction: moisten with saline and lay in wound bed, pack into tunnel Secondary Dressing ABD Pad, 8x10 Discharge Instruction: Apply over primary dressing as directed. Secured With 34M Medipore H Soft Cloth Surgical T ape, 4 x 10 (in/yd) Discharge Instruction: Secure with tape as directed. Compression Wrap Compression Stockings Add-Ons Electronic Signature(s) Signed: 11/13/2021 4:58:18 PM By: [name] Gouty RN, BSN Entered By: [name] on 11/13/2021 11:18:11 -------------------------------------------------------------------------------- Wound Assessment Details Patient Name: Date of  Service: [name]. 11/13/2021 11:00 A M Medical Record Number: [id number] Patient Account Number: [id number] Date of Birth/Sex: Treating RN: 09/12/1950 (71 y.o. [name] Primary Care Orene Abbasi: [name] Other Clinician: Referring Venetia Prewitt: Treating Artasia Thang/Extender: [name] in Treatment: 30 Wound Status Wound Number: 25 Primary Venous Leg Ulcer Etiology: Wound Location: Left, Anterior Lower Leg Secondary Diabetic Wound/Ulcer of the Lower Extremity Wounding Event: Gradually Appeared Etiology: Date Acquired: 11/06/2021 Wound Open Weeks Of Treatment: 1 Status: Clustered Wound: No Comorbid Glaucoma, Sleep Apnea, Hypertension, Peripheral Arterial Disease, History: Peripheral Venous Disease, Type II Diabetes, Gout, Osteoarthritis, Neuropathy Photos Wound Measurements Length: (cm) Width: (cm) Depth: (cm) Area: (cm) Volume: (cm) 0 % Reduction in Area: 100% 0 % Reduction in Volume: 100% 0 Epithelialization: Large (67-100%) 0 Tunneling: No 0 Undermining: No Wound Description Classification: Full Thickness Without Exposed Support Structures Exudate Amount: None Present Foul Odor After Cleansing: No Slough/Fibrino No Wound Bed Granulation Amount: None Present (0%) Exposed Structure Necrotic Amount: None Present (0%) Fascia Exposed: No Fat Layer (Subcutaneous Tissue) Exposed: No Tendon Exposed: No Muscle Exposed: No Joint Exposed: No Bone Exposed: No Electronic Signature(s) Signed: 11/13/2021 4:58:18 PM By: [name] Gouty RN, BSN Entered By: [name] on 11/13/2021 11:18:44 -------------------------------------------------------------------------------- Wound Assessment Details Patient Name: Date of Service: [name]. 11/13/2021 11:00 A M Medical Record Number: [id number] Patient Account Number: [id number] Date of Birth/Sex: Treating RN: [birth year] (71 y.o. [name] Primary Care Rosangela Fehrenbach: [name] Other Clinician: Referring Nadiah Corbit: Treating Chirstina Haan/Extender: [name] in Treatment: 30 Wound Status Wound Number: 26 Primary Diabetic Wound/Ulcer of the Lower Extremity Etiology: Wound Location: Left, Medial Foot Wound Open Wounding Event: Gradually Appeared Status: Date Acquired: 11/06/2021 Comorbid Glaucoma, Sleep Apnea, Hypertension, Peripheral Arterial Disease, Weeks Of Treatment: 1 History: Peripheral Venous Disease, Type II Diabetes, Gout, Osteoarthritis, Clustered Wound: No Neuropathy Photos Wound Measurements Length: (cm) Width: (cm) Depth: (cm) Area: (cm) Volume: (cm) 0 % Reduction in Area: 100% 0 % Reduction in Volume: 100% 0 Epithelialization: Large (67-100%) 0 Tunneling: No 0 Undermining: No Wound Description Classification: Grade 1 Exudate Amount: None Present Foul Odor After Cleansing: No Slough/Fibrino No Wound Bed Granulation Amount: None Present (0%) Exposed Structure Necrotic Amount: None Present (0%) Fascia Exposed: No Fat Layer (Subcutaneous Tissue) Exposed: No Tendon Exposed: No Muscle Exposed: No Joint Exposed: No Bone Exposed: No Assessment Notes scabbed Electronic Signature(s) Signed: 11/13/2021 4:58:18 PM By: [name] Gouty RN, BSN Entered By: [name] on 11/13/2021 11:19:19 -------------------------------------------------------------------------------- Vitals Details Patient Name: Date of Service: [name].  11/13/2021 11:00 A M Medical Record Number: [id number] Patient Account Number: [id number] Date of Birth/Sex: Treating RN: 22-Jun-1951 (71 y.o. [name] Primary Care Hameed Kolar: [name] Other Clinician: Referring Masai Kidd: Treating Martie Muhlbauer/Extender: [name] in Treatment: 30 Vital Signs Time Taken: 11:10 Temperature (F): 98.2 Height (in): 74 Pulse (bpm): 67 Source: Stated Respiratory Rate (breaths/min): 20 Weight (lbs): 238 Blood  Pressure (mmHg): 153/87 Source: Stated Capillary Blood Glucose (mg/dl): 287 Body Mass Index (BMI): 30.6 Reference Range: 80 - 120 mg / dl Notes glucose per pt report this am Electronic Signature(s) Signed: 11/13/2021 4:58:18 PM By: [name] Gouty RN, BSN Entered By: [name] on 11/13/2021 11:10:57

## 2021-11-13 NOTE — Progress Notes (Signed)
[name], [name] ([id number]) Visit Report for 11/13/2021 Chief Complaint Document Details Patient Name: Date of Service: [name] 11/13/2021 11:00 A M Medical Record Number: [id number] Patient Account Number: [id number] Date of Birth/Sex: Treating RN: Nov 29, 1950 (71 y.o. [name] Primary Care Provider: Jilda [name] Other Clinician: Referring Provider: Treating Provider/Extender: [name] in Treatment: 30 Information Obtained from: Patient Chief Complaint Left leg and foot ulcers 04/12/2021; patient is here for wounds on his left lower leg and left plantar foot over the first metatarsal head Electronic Signature(s) Signed: 11/13/2021 11:40:02 AM By: [name] Maudlin MD FACS Entered By: [name] on 11/13/2021 11:40:01 -------------------------------------------------------------------------------- Debridement Details Patient Name: Date of Service: [name]. 11/13/2021 11:00 A M Medical Record Number: [id number] Patient Account Number: [id number] Date of Birth/Sex: Treating RN: January 01, 1951 (71 y.o. [name] Primary Care Provider: Jilda [name] Other Clinician: Referring Provider: Treating Provider/Extender: [name] in Treatment: 30 Debridement Performed for Assessment: Wound #18 Left,Plantar Metatarsal head first Performed By: Physician [name] Maudlin, MD Debridement Type: Debridement Severity of Tissue Pre Debridement: Fat layer exposed Level of Consciousness (Pre-procedure): Awake and Alert Pre-procedure Verification/Time Out Yes - 11:25 Taken: Start Time: 11:25 T Area Debrided (L x W): otal 0.9 (cm) x 1.2 (cm) = 1.08 (cm) Tissue and other material debrided: Viable, Non-Viable, Slough, Subcutaneous, Skin: Epidermis, Slough Level: Skin/Subcutaneous Tissue Debridement Description: Excisional Instrument: Curette Bleeding: Minimum Hemostasis Achieved: Pressure Procedural Pain: 0 Post  Procedural Pain: 0 Response to Treatment: Procedure was tolerated well Level of Consciousness (Post- Awake and Alert procedure): Post Debridement Measurements of Total Wound Length: (cm) 0.9 Width: (cm) 1.2 Depth: (cm) 0.1 Volume: (cm) 0.085 Character of Wound/Ulcer Post Debridement: Improved Severity of Tissue Post Debridement: Fat layer exposed Post Procedure Diagnosis Same as Pre-procedure Electronic Signature(s) Signed: 11/13/2021 12:07:43 PM By: [name] Maudlin MD FACS Signed: 11/13/2021 4:58:18 PM By: [name] Gouty RN, BSN Entered By: [name] on 11/13/2021 11:30:36 -------------------------------------------------------------------------------- HPI Details Patient Name: Date of Service: [name]. 11/13/2021 11:00 A M Medical Record Number: [id number] Patient Account Number: [id number] Date of Birth/Sex: Treating RN: June 09, 1951 (71 y.o. [name] Primary Care Provider: Jilda [name] Other Clinician: Referring Provider: Treating Provider/Extender: [name] in Treatment: 30 History of Present Illness HPI Description: 10/11/17; [name] is a 71 year old man who tells me that in 2015 he slipped down the latter traumatizing his left leg. He developed a wound in the same spot the area that we are currently looking at. He states this closed over for the most part although he always felt it was somewhat unstable. In 2016 he hit the same area with the door of his car had this reopened. He tells me that this is never really closed although sometimes an inflow it remains open on a constant basis. He has not been using any specific dressing to this except for topical antibiotics the nature of which were not really sure. His primary doctor did send him to see Dr. [name] of interventional cardiology. He underwent an angiogram on 08/06/17 and he underwent a PTA and directional atherectomy of the lesser distal SFA and popliteal arteries which  resulted in brisk improvement in blood flow. It was noted that he had 2 vessel runoff through the anterior tibial and peroneal. He is also been to see vascular and interventional radiologist. He was not felt to have any significant superficial venous insufficiency. Presumably is not a candidate for any ablation. It  was suggested he come here for wound care. The patient is a type II diabetic on insulin. He also has a history of venous insufficiency. ABIs on the left were noncompressible in our clinic 10/21/17; patient we admitted to the clinic last week. 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 We put Iodosorb on him after an aggressive debridement and 3 layer compression. He complained of pain in his ankle and itching with is skin in fact he scratched the area on the medial calf superiorly at the rim of our wraps and he has 2 small open areas in that location today which are new. I changed his primary dressing today to silver collagen. As noted he is already had revascularization and does not have any significant superficial venous insufficiency that would be amenable to ablation 10/28/17; patient admitted to the clinic 2 weeks ago. He has a smaller Wound. Scratch injury from last week revealed. There is large wound over the tibial area. This is smaller. Granulation looks healthy. No need for debridement. 11/04/17; the wound on the left lateral calf looks better. Improved dimensions. Surface of this looks better. We've been maintaining him and Kerlix Coban wraps. He finds this much more comfortable. Silver collagen dressing 11/11/17; left lateral Wound continues to look healthy be making progress. Using a #5 curet I removed removed nonviable skin from the surface of the wound and then necrotic debris from the wound surface. Surface of the wound continues to look healthy. He also has an open area on the left great toenail bed. We've been  using topical antibiotics. 11/19/17; left anterior lateral wound continues to look healthy but it's not closed. He also had a small wound above this on the left leg Initially traumatic wounds in the setting of significant chronic venous insufficiency and stasis dermatitis 11/25/17; left anterior wounds superiorly is closed still a small wound inferiorly. 12/02/17; left anterior tibial area. Arrives today with adherent callus. Post debridement clearly not completely closed. Hydrofera Blue under 3 layer compression. 12/09/17; left anterior tibia. Circumferential eschar however the wound bed looks stable to improved. We've been using Hydrofera Blue under 3 layer compression 12/17/17; left anterior tibia. Apparently this was felt to be closed however when the wrap was taken off there is a skin tear to reopen wounds in the same area we've been using Hydrofera Blue under 3 layer compression 12/23/17 left anterior tibia. Not close to close this week apparently the [facility] was stuck to this again. Still circumferential eschar requiring debridement. I put a contact layer on this this time under the Hydrofera Blue 12/31/17; left anterior tibia. Wound is better slight amount of hyper-granulation. Using Hydrofera Blue over Adaptic. 01/07/18; left anterior tibia. The wound had some surface eschar however after this was removed he has no open wound.he was already revascularized by Dr. [name] when he came to our clinic with atherectomy of the left SFA and popliteal artery. He was also sent to interventional radiology for venous reflux studies. He was not felt to have significant reflux but certainly has chronic venous changes of his skin with hemosiderin deposition around this area. He will definitely need to lubricate his skin and wear compression stocking and I've talked to him about this. READMISSION 05/26/2018 This is a now 71 year old man we cared for with traumatic wounds on his left anterior lower extremity. He  had been previously revascularized during that admission by Dr. [name]. Apparently in follow-up Dr. [name]  noted that he had deterioration in his arterial status. He underwent a stent placement in the distal left SFA on 04/22/2018. 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 He went back to the angiography suite on 04/30/2018 he underwent PTA and balloon angioplasty of the occluded left mid anterior tibial artery, thrombotic occlusion went from 100 to 0% which reconstitutes the posterior tibial artery. He had thrombectomy and aspiration of the peroneal artery. The stent placed in the distal SFA left SFA was still occluded. 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 He has been recuperating at [facility] in [facility] after the most recent hospitalization. He is going home tomorrow. He tells me that 2 weeks ago he traumatized the tip of his left fifth toe. He came in urgently for our review of this. This was a history of before I noted that Dr. [name] had already noted dry gangrenous changes of the left fifth toe 06/09/2018; 2-week follow-up. I did contact Dr. [name] after his last appointment and he apparently saw 1 of Dr. Irven Shelling colleagues the next day. He does not follow-up with Dr. [name] himself until Thursday of this week. 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 Nevertheless there is no evidence of infection no drainage and no pain. He had a new area that this week when we were signing him in today on the left anterior mid tibia area, this is in close proximity to the previous wound we have dealt with in this clinic. 06/23/2018; 2-week follow-up. I did not receive a recent note from Dr. [name] to review today.  Our office is trying to obtain this. He is apparently not planning to do further vascular interventions and wondered about compression to try and help with the patient's chronic venous insufficiency. However we are also concerned about the arterial flow. He arrives in clinic today with a new area on the left third toe. The areas on the calf/anterior tibia are close to closing. The left fifth toe is still mummified using Betadine. -In reviewing things with the patient he has what sounds like claudication with mild to moderate amount of activity. 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 I prescribed Levaquin over the phone while we attempted to arrange a plan of care. However the patient called yesterday to report he had low-grade fever and he came in today acutely. There is been a marked deterioration in the left third toe with spreading cellulitis up into the dorsal left foot. He was referred to the emergency room. Readmission: 06/29/2020 patient presents today for reevaluation here in our clinic he was previously treated by Dr. Dellia Nims at the latter part of 2019 in 2 the beginning of 2020. Subsequently we have not seen him since that time in the interim he did have evaluation with vein and vascular specialist specifically Dr. Anice Paganini who did perform quite extensive work for a left femoral to anterior tibial artery bypass. With that being said in the interim the patient has developed significant lymphedema and has wounds that he tells me have really never healed in regard to the incision site on the left leg. He also has multiple wounds on the feet for various  reasons some of which is that he tends to pick at his feet. Fortunately there is no signs of active infection systemically at this time he does have some wounds that are little bit deeper but most are fairly superficial he seems to have good blood flow and overall everything appears to be healthy I see  no bone exposed and no obvious signs of osteomyelitis. I do not know that he necessarily needs a x-ray at this point although that something we could consider depending on how things progress. The patient does have a history of lymphedema, diabetes, this is type II, chronic kidney disease stage III, hypertension, and history of peripheral vascular disease. 07/05/2020; patient admitted last week. Is a patient I remember from 2019 he had a spreading infection involving the left foot and we sent him to the hospital. 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 He subsequently had a left femoral to anterior tibial bypass by Dr.Cain vein and vascular. He also has severe lymphedema with chronic skin changes related to that on the left leg. The most problematic area that was new today was on the left medial great toe. This was apparently a small area last week there was purulent drainage which our intake nurse cultured. Also areas on the left medial foot and heel left lateral foot. He has 2 areas on the left medial calf left lateral calf in the setting of the severe lymphedema. 07/13/2020 on evaluation today patient appears to be doing better in my opinion compared to his last visit. The good news is there is no signs of active infection systemically and locally I do not see any signs of infection either. He did have an x-ray which was negative that is great news he had a culture which showed MRSA but at the same time he is been on the doxycycline which has helped. I do think we may want to extend this for 7 additional days 1/25; patient admitted to the clinic a few weeks ago. 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 We have been putting him under compression his edema control is a lot better but he is severe verricused skin on the left leg. He is really done quite well he still has an open area on the left medial calf and the  left medial first metatarsal head. We have been using silver collagen on the leg silver alginate on the foot 07/27/2020 upon evaluation today patient appears to be doing decently well in regard to his wounds. He still has a lot of dry skin on the left leg. Some of this is starting to peel back and I think he may be able to have them out by removing some that today. Fortunately there is no signs of active infection at this time on the left leg although on the right leg he does appear to have swelling and erythema as well as some mild warmth to touch. This does have been concerned about the possibility of cellulitis although within the differential diagnosis I do think that potentially a DVT has to be at least considered. We need to rule that out before proceeding would just call in the cellulitis. Especially since he is having pain in the posterior aspect of his calf muscle. 2/8; the patient had seen sparingly. He has severe skin changes of chronic lymphedema in the left leg thickened hyperkeratotic verrucous skin. He has an open wound on the  medial part of the left first met head left mid tibia. He also has a rim of nonepithelialized skin in the anterior mid tibia. He brought in the AmLactin lotion that was been prescribed although I am not sure under compression and its utility. There concern about cellulitis on the right lower leg the last time he was here. He was put on on antibiotics. His DVT rule out was negative. The right leg looks fine he is using his stocking on this area 08/10/2020 upon evaluation today patient appears to be doing well with regard to his leg currently. He has been tolerating the dressing changes without complication. Fortunately there is no signs of active infection which is great news. Overall very pleased with where things stand. 2/22; the patient still has an area on the medial part of the left first met his head. This looks better than when I last saw this earlier this month  he has a rim of epithelialization but still some surface debris. Mostly everything on the left leg is healed. There is still a vulnerable in the left mid tibia area. 08/30/2020 upon evaluation today patient appears to be doing much better in regard to his wounds on his foot. 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 Nonetheless I think we will address that today I Minna send in a prescription for him in that regard. Overall though there does not appear to be any signs of significant worsening. 09/07/2020 on evaluation today patient's wounds over his left foot appear to be doing excellent. I do not see any signs of infection there is some callus buildup this can require debridement for certain but overall I feel like he is managing quite nicely. He still using the AmLactin cream which has been beneficial for him as well. 3/22; left foot wound is closed. There is no open area here. He is using ammonium lactate lotion to the lower extremities to help exfoliate dry cracked skin. He has compression stockings from elastic therapy in [place]. The wound on the medial part of his left first met head is healed today. READMISSION 04/12/2021 Mr. Zullo is a patient we know fairly well he had a prolonged stay in clinic in 2019 with wounds on his left lateral and left anterior lower extremity in the setting of chronic venous insufficiency. More recently he was here earlier this year with predominantly an area on his left foot first metatarsal head plantar and he says the plantar foot broke down on its not long after we discharged him but he did not come back here. The last few months areas of broken down on his left anterior and again the left lateral lower extremity. The leg itself is very swollen chronically enlarged a lot of hyperkeratotic dry Berry Q skin in the left lower  leg. His edema extends well into the thigh. He was seen by Dr. Donzetta Matters. He had ABIs on 03/02/2021 showing an ABI on the right of 1 with a TBI of 0.72 his ABI in the left at 1.09 TBI of 0.99. Monophasic and biphasic waveforms on the right. On the left monophasic waveforms were noted he went on to have an angiogram on 03/27/2021 this showed the aortic aortic and iliac segments were free of flow-limiting stenosis the left common femoral vein to evaluate the left femoral to anterior tibial artery bypass was unobstructed the bypass was  patent without any areas of stenosis. We discharged the patient in bilateral juxta lite stockings but very clearly that was not sufficient to control the swelling and maintain skin integrity. He is clearly going to need compression pumps. 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 This is fortunate because he is on his feet for long periods of time. 10/27; patient comes in with our intake nurse reporting copious amount of green drainage from the left anterior mid tibia the left dorsal foot and to a lesser extent the left medial mid tibia. We left the compression wrap on all week for the amount of edema in his left leg is quite a bit better. We use silver alginate as the primary dressing 11/3; edema control is good. Left anterior lower leg left medial lower leg and the plantar first metatarsal head. The left anterior lower leg required debridement. Deep tissue culture I did of this wound showed MRSA I put him on 10 days of doxycycline which she will start today. We have him in compression wraps. He has a security card and [employer] however he is retiring on November 15. We will need to then get him into a better offloading boot for the left foot perhaps a total contact cast 11/10; edema control is quite good. Left anterior and left medial lower leg wounds in the setting of chronic venous insufficiency and lymphedema. He also has a  substantial area over the left plantar first metatarsal head. 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 He has significant hypergranulation on the left plantar foot wound. The patient is a diabetic but he does not have significant PAD 11/17; edema control is quite good. Left anterior and left medial lower leg wounds look better. The really concerning area remains the area on the left plantar first metatarsal head. He has a rim of epithelialization. 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 Starting today with a forefoot off loader but . possibly a total contact cast. 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 Arrives with a very significant odor from I think all of the wounds on the left leg and the left foot. Because of this I did not put a total contact cast on him today but will could still consider this. His wife was having cataract surgery which is the reason he missed the appointment 12/6. I saw this man 5 days ago with a swelling below the popliteal fossa. I thought he actually might have a Baker's cyst however the DVT rule out study that we could arrange right away was negative the technician told me this was not a ruptured Baker's cyst. We attempted to get this aspirated by under ultrasound guidance in interventional radiology however all they did was an ultrasound however it shows an extensive fluid collection 62 x 8 x 9.4 in the left thigh and left calf. The patient states he thinks this started 8 days ago or so but he really is not complaining of any pain, fever or systemic symptoms. He has not ha 12/20; after some difficulty I managed to  get the patient into see Dr. Donzetta Matters.  Eventually he was taken into the hospital and had a drain put in the fluid collection below his left knee posteriorly extending into the posterior thigh. He still has the drain in place. Culture of this showed moderate staff aureus few Morganella and few Klebsiella he is now on doxycycline and ciprofloxacin as suggested by infectious disease he is on this for a month. The drain will remain in place until it stops draining 12/29; he comes in today with the 1 wound on his left leg and the area on the left plantar first met head significantly smaller. Both look healthy. He still has the drain in the left leg. He says he has to change this daily. Follows up with Dr. Donzetta Matters on January 11. 06/29/2021; the wounds that I am following on the left leg and left first met head continued to be quite healthy. However the area where his inferior drain is in place had copious amounts of drainage which was green in color. The wound here is larger. Follows up with Dr. Gwenlyn Saran of vein and vascular his surgeon next week as well as infectious disease. He remains on ciprofloxacin and doxycycline. He is not complaining of excessive pain in either one of the drain areas 1/12; the patient saw vascular surgery and infectious disease. Vascular surgery has left the drain in place as there was still some notable drainage still see him back in 2 weeks. Dr. Velna Ochs stop the doxycycline and ciprofloxacin and I do not believe he follows up with them at this point. Culture I did last week showed both doxycycline resistant MRSA and Pseudomonas not sensitive to ciprofloxacin although only in rare titers 1/19; the patient's wound on the left anterior lower leg is just about healed. We have continued healing of the area that was medially on the left leg. Left first plantar metatarsal head continues to get smaller. The major problem here is his 2 drain sites 1 on the left upper calf and lateral thigh. There is purulent drainage still from the left  lateral thigh. I gave him antibiotics last week but we still have recultured. He has the drain in the area I think this is eventually going to have to come out. I suspect there will be a connecting wound to heal here perhaps with improved VAc 1/26; the patient had his drain removed by vein and vascular on 1/25/. This was a large pocket of fluid in his left thigh that seem to tunnel into his left upper calf. 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 His [name] his Penrose drain was removed today. He now has a tunneling wound on his left calf and left thigh. Both of these probe widely towards each other although I cannot really prove that they connect. Both wounds on his lower leg anteriorly are closed and his area over the first metatarsal head on his right foot continues to improve. We are using Hydrofera Blue here. He also saw infectious disease culture of the abscess they noted was polymicrobial with MRSA, Morganella and Klebsiella he was treated with doxycycline and ciprofloxacin for 4 weeks ending on 07/03/2021. They did not recommend any further antibiotics. Notable that while he still had the Penrose drain in place last week he had purulent drainage coming out of the inferior IandD site this grew [facility], MRSA and Pseudomonas but there does not appear to be any active infection in this area today with the drain out and  he is not systemically unwell 2/2; with regards to the drain sites the superior one on the thigh actually is closed down the one on the upper left lateral calf measures about 8 and half centimeters which is an improvement seems to be less prominent although still with a lot of drainage. The only remaining wound is over the first metatarsal head on the left foot and this looks to be continuing to improve with Hydrofera Blue. 2/9; the area on his plantar left foot continues to contract. Callus around the wound edge. The drain sites specifically have not come  down in depth. We put the wound VAC on Monday he changed the canister late last night our intake nurse reported a pocket of fluid perhaps caused by our compression wraps 2/16; continued improvement in left foot plantar wound. drainage site in the calf is not improved in terms of depth (wound vac) 2/23; continued improvement in the left foot wound over the first metatarsal head. With regards to the drain sites the area on his thigh laterally is healed however the open area on his calf is small in terms of circumference by still probes in by about 15 cm. Within using the wound VAC. Hydrofera Blue on his foot 08/24/2021: The left first metatarsal head wound continues to improve. The wound bed is healthy with just some surrounding callus. Unfortunately the open drain site on his calf remains open and tunnels at least 15 cm (the extent of a Q-tip). This is despite several weeks of wound VAC treatment. Based on reading back through the notes, there has been really no significant change in the depth of the wound, although the orifice is smaller and the more cranial wound on his thigh has closed. I suspect the tunnel tracks nearly all the way to this location. 08/31/2021: Continued improvement in the left first metatarsal head wound. There has been absolutely no improvement to the long tunnel from his open drain site on his calf. We have tried to get him into see vascular surgery sooner to consider the possibility of simply filleting the tract open and allowing it to heal from the bottom up, likely with a wound VAC. They have not yet scheduled a sooner appointment than his current mid April 09/14/2021: He was seen by vascular surgery and they took him to the operating room last week. They opened a portion of the tunnel, but did not extend the entire length of the known open subcutaneous tract. I read Dr. Claretha Cooper operative note and it is not clear from that documentation why only a portion of the tract was opened.  The heaped up granulation tissue was curetted and removed from at least some portion of the tract. They did place a wound VAC and applied an Unna boot to the leg. The ulcer on his left first metatarsal head is smaller today. The bed looks good and there is just a small amount of surrounding callus. 09/21/2021: The ulcer on his left first metatarsal head looks to be stalled. There is some callus surrounding the wound but the wound bed itself does not appear particularly dynamic. The tunnel tract on his lateral left leg seems to be roughly the same length or perhaps slightly smaller but the wound bed appears healthy with good granulation tissue. He opened up a new wound on his medial thigh and the site of a prior surgical incision. He says that he did this unconsciously in his sleep by scratching. 09/28/2021: Unfortunately, the ulcer on his left first metatarsal  head has extended underneath the callus toward the dorsum of his foot. The medial thigh wounds are roughly the same. The tunnel on his lateral left leg continues to be problematic; it is longer than we are able to actually probe with a Q-tip. I am still not certain as to why Dr. Donzetta Matters did not open this up entirely when he took the patient to the operating room. We will likely be back in the same situation with just a small superficial opening in a long unhealed tract, as the open portion is granulating in nicely. 10/02/2021: The patient was initially scheduled for a nurse visit, but we are also applying a total contact cast today. The plantar foot wound looks clean without significant accumulated callus. We have been applying Prisma silver collagen to the site. 10/05/2021: The patient is here for his first total contact cast change. We have tried using gauze packing strips in the tunnel on his lateral leg wound, but this does not seem to be working any better than the white VAC foam. The foot ulcer looks about the same with minimal periwound callus.  Medial thigh wound is clean with just some overlying eschar. 10/12/2021: The plantar foot wound is stable without any significant accumulation of periwound callus. The surface is viable with good granulation tissue. The medial thigh wounds are much smaller and are epithelializing. On the other hand, he had purulent drainage coming from the tunnel on his lateral leg. He does go back to see Dr. Donzetta Matters next week and is planning to ask him why the wound tunnel was not completely opened at the time of his most recent operation. 10/19/2021: The plantar foot wound is markedly improved and has epithelial tissue coming through the surface. The medial thigh wounds are nearly closed with just a tiny open area. He did see Dr. Donzetta Matters earlier this week and apparently they did discuss the possibility of opening the sinus tract further and enabling a wound VAC application. Apparently there are some limits as to what Dr. 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 I think if we could get the tract open to the level of the popliteal fossa, this would greatly aid in her ability to get this chart closed. That being said, however, today when I probed the tract with a Q-tip, I was not able to insert the entirety of the Q-tip as I have on previous occasions. The tunnel is shorter by about 4 cm. The surface is clean with good granulation tissue and no further episodes of purulent drainage. 10/30/2021: Last week, the patient underwent surgery and had the long tract in his leg opened. There was a rind that was debrided, according to the operative report. His medial thigh ulcers are closed. The plantar foot wound is clean with a good surface and some built up surrounding callus. 11/06/2021: The overall dimensions of the large wound on his lateral leg remain about the same, but there is good granulation tissue present and the tunneling is a little bit shorter. He has a new wound on his anterior tibial  surface, in the same location where he had a similar lesion in the past. The plantar foot wound is clean with some buildup surrounding callus. Just toward the medial aspect of his foot, however, there is an area of darkening that once debrided, revealed another opening in the skin surface. 11/13/2021: The anterior tibial surface wound is closed. The plantar foot wound has some surrounding callus buildup. The area of darkening  that I debrided last week and revealed an opening in the skin surface has closed again. The tunnel in the large wound on his lateral leg has come in by about 3 cm. There is healthy granulation tissue on the entire wound surface. Electronic Signature(s) Signed: 11/13/2021 11:41:24 AM By: [name] Maudlin MD FACS Entered By: [name] on 11/13/2021 11:41:24 -------------------------------------------------------------------------------- Physical Exam Details Patient Name: Date of Service: [name]. 11/13/2021 11:00 A M Medical Record Number: [id number] Patient Account Number: [id number] Date of Birth/Sex: Treating RN: 20-Jun-1951 (71 y.o. [name] Primary Care Provider: Jilda [name] Other Clinician: Referring Provider: Treating Provider/Extender: [name] in Treatment: 30 Constitutional Slightly hypertensive. . . . No acute distress. Respiratory Normal work of breathing on room air. Notes 11/13/2021: The anterior tibial surface wound is closed. The plantar foot wound has some surrounding callus buildup. The area of darkening that I debrided last week and revealed an opening in the skin surface has closed again. The tunnel in the large wound on his lateral leg has come in by about 3 cm. There is healthy granulation tissue on the entire wound surface. Electronic Signature(s) Signed: 11/13/2021 11:45:44 AM By: [name] Maudlin MD FACS Entered By: [name] on 11/13/2021  11:45:44 -------------------------------------------------------------------------------- Physician Orders Details Patient Name: Date of Service: [name]. 11/13/2021 11:00 A M Medical Record Number: [id number] Patient Account Number: [id number] Date of Birth/Sex: Treating RN: August 19, 1950 (71 y.o. [name] Primary Care Provider: Other Clinician: Jilda [name] Referring Provider: Treating Provider/Extender: [name] in Treatment: 31 Verbal / Phone Orders: No Diagnosis Coding ICD-10 Coding Code Description E11.621 Type 2 diabetes mellitus with foot ulcer 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 I89.0 Lymphedema, not elsewhere classified I87.322 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verity 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 of other part of left foot with other specified severity Follow-up Appointments ppointment in 1 week. - Dr Celine Ahr Room 2 Return A Thurs 6/1 at 10:45 am Bathing/ [employer] May shower with protection but do not get wound dressing(s) wet. - Use a cast protector so you can shower without getting your wrap(s) wet Negative Presssure Wound Therapy Wound #22 Left,Lateral Lower Leg Wound Vac to wound continuously at 153mm/hg pressure - change 2 times per week in clinic ****HOLD VAC THIS WEEK** Black Foam White Foam Edema Control - Lymphedema / SCD / Other Elevate legs to the level of the heart or above for 30 minutes daily and/or when sitting, a frequency of: - throughout the day Avoid standing for long periods of time. Patient to wear own compression stockings every day. - on right leg; Moisturize legs daily. - Ammonium lactate to right leg daily Wound Treatment Wound #18 - Metatarsal head first Wound Laterality: Plantar, Left Cleanser: Soap  and Water 2 x Per Week/7 Days Discharge Instructions: May shower and wash wound with dial antibacterial soap and water prior to dressing change. Cleanser: Wound Cleanser 2 x Per Week/7 Days Discharge Instructions: Cleanse the wound with wound cleanser prior to applying a clean dressing using gauze sponges, not tissue or cotton balls. Peri-Wound Care: Triamcinolone 15 (g) 2 x Per Week/7 Days Discharge Instructions: Use triamcinolone 15 (g) as directed Peri-Wound Care: Sween Lotion (Moisturizing lotion) 2 x Per Week/7 Days Discharge Instructions: Apply moisturizing lotion as directed Prim Dressing: Promogran Prisma Matrix, 4.34 (sq in) (  silver collagen) 2 x Per Week/7 Days ary Discharge Instructions: Moisten collagen with saline or hydrogel Secondary Dressing: Woven Gauze Sponge, Non-Sterile 4x4 in 2 x Per Week/7 Days Discharge Instructions: Apply over primary dressing as directed. Secured With: 19M Medipore H Soft Cloth Surgical T ape, 4 x 10 (in/yd) 2 x Per Week/7 Days Discharge Instructions: Secure with tape as directed. Compression Wrap: CoFlex TLC XL 2-layer Compression System 4x7 (in/yd) 2 x Per Week/7 Days Discharge Instructions: Apply CoFlex 2-layer compression as directed. (alt for 4 layer) Wound #22 - Lower Leg Wound Laterality: Left, Lateral Cleanser: Soap and Water 1 x Per Day/10 Days Discharge Instructions: May shower and wash wound with dial antibacterial soap and water prior to dressing change. Cleanser: Wound Cleanser 1 x Per Day/10 Days Discharge Instructions: Cleanse the wound with wound cleanser prior to applying a clean dressing using gauze sponges, not tissue or cotton balls. Prim Dressing: Kerlix AMD Roll Dressing, 4.5x 4.1 (in/in) 1 x Per Day/10 Days ary Discharge Instructions: moisten with saline and lay in wound bed, pack into tunnel Secondary Dressing: ABD Pad, 8x10 1 x Per Day/10 Days Discharge Instructions: Apply over primary dressing as directed. Secured With: 19M  Medipore H Soft Cloth Surgical T ape, 4 x 10 (in/yd) 1 x Per Day/10 Days Discharge Instructions: Secure with tape as directed. Electronic Signature(s) Signed: 11/13/2021 12:07:43 PM By: [name] Maudlin MD FACS Entered By: [name] on 11/13/2021 11:46:07 -------------------------------------------------------------------------------- Problem List Details Patient Name: Date of Service: [name]. 11/13/2021 11:00 A M Medical Record Number: [id number] Patient Account Number: [id number] Date of Birth/Sex: Treating RN: May 01, 1951 (71 y.o. [name] Primary Care Provider: Jilda [name] Other Clinician: Referring Provider: Treating Provider/Extender: [name] in Treatment: 30 Active Problems ICD-10 Encounter Code Description Active Date MDM Diagnosis E11.621 Type 2 diabetes mellitus with foot ulcer 04/12/2021 No Yes 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 04/12/2021 No Yes I89.0 Lymphedema, not elsewhere classified 04/12/2021 No Yes I87.322 Chronic venous hypertension (idiopathic) with inflammation of left lower 04/12/2021 No Yes extremity 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 ulcer of other part of left lower leg with other specified 04/12/2021 No Yes severity 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 of other part of left foot with other specified 04/12/2021 No Yes severity Inactive Problems ICD-10 Code Description Active Date Inactive Date E11.42 Type 2 diabetes mellitus with diabetic polyneuropathy 04/12/2021 04/12/2021 L02.416 Cutaneous abscess of left lower limb 06/13/2021 06/13/2021 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 07/20/2021 07/20/2021 Resolved Problems Electronic Signature(s) Signed: 11/13/2021 11:32:36 AM By: [name] Maudlin MD FACS Entered By: [name] on 11/13/2021  11:32:36 -------------------------------------------------------------------------------- Progress Note Details Patient Name: Date of Service: [name]. 11/13/2021 11:00 A M Medical Record Number: [id number] Patient Account Number: [id number] Date of Birth/Sex: Treating RN: Apr 27, 1951 (71 y.o. [name] Primary Care Provider: Jilda [name] Other Clinician: Referring Provider: Treating Provider/Extender: [name] in Treatment: 30 Subjective Chief Complaint Information obtained from Patient Left leg and foot ulcers 04/12/2021; patient is here for wounds on his left lower leg and left plantar foot over the first metatarsal head History of Present Illness (HPI) 10/11/17; Mr. Lasser is a 71 year old man who tells me that in 2015 he slipped down the latter traumatizing his left leg. He developed a wound in the same spot the area that we are currently looking at. He states this closed over for the most part although he always felt it was  somewhat unstable. In 2016 he hit the same area with the door of his car had this reopened. He tells me that this is never really closed although sometimes an inflow it remains open on a constant basis. He has not been using any specific dressing to this except for topical antibiotics the nature of which were not really sure. His primary doctor did send him to see Dr. [name] of interventional cardiology. He underwent an angiogram on 08/06/17 and he underwent a PTA and directional atherectomy of the lesser distal SFA and popliteal arteries which resulted in brisk improvement in blood flow. It was noted that he had 2 vessel runoff through the anterior tibial and peroneal. He is also been to see vascular and interventional radiologist. He was not felt to have any significant superficial venous insufficiency. Presumably is not a candidate for any ablation. It was suggested he come here for wound care. The patient is a type II  diabetic on insulin. He also has a history of venous insufficiency. ABIs on the left were noncompressible in our clinic 10/21/17; patient we admitted to the clinic last week. 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 We put Iodosorb on him after an aggressive debridement and 3 layer compression. He complained of pain in his ankle and itching with is skin in fact he scratched the area on the medial calf superiorly at the rim of our wraps and he has 2 small open areas in that location today which are new. I changed his primary dressing today to silver collagen. As noted he is already had revascularization and does not have any significant superficial venous insufficiency that would be amenable to ablation 10/28/17; patient admitted to the clinic 2 weeks ago. He has a smaller Wound. Scratch injury from last week revealed. There is large wound over the tibial area. This is smaller. Granulation looks healthy. No need for debridement. 11/04/17; the wound on the left lateral calf looks better. Improved dimensions. Surface of this looks better. We've been maintaining him and Kerlix Coban wraps. He finds this much more comfortable. Silver collagen dressing 11/11/17; left lateral Wound continues to look healthy be making progress. Using a #5 curet I removed removed nonviable skin from the surface of the wound and then necrotic debris from the wound surface. Surface of the wound continues to look healthy. ooHe also has an open area on the left great toenail bed. We've been using topical antibiotics. 11/19/17; left anterior lateral wound continues to look healthy but it's not closed. ooHe also had a small wound above this on the left leg ooInitially traumatic wounds in the setting of significant chronic venous insufficiency and stasis dermatitis 11/25/17; left anterior wounds superiorly is closed still a small wound inferiorly. 12/02/17; left  anterior tibial area. Arrives today with adherent callus. Post debridement clearly not completely closed. Hydrofera Blue under 3 layer compression. 12/09/17; left anterior tibia. Circumferential eschar however the wound bed looks stable to improved. We've been using Hydrofera Blue under 3 layer compression 12/17/17; left anterior tibia. Apparently this was felt to be closed however when the wrap was taken off there is a skin tear to reopen wounds in the same area we've been using Hydrofera Blue under 3 layer compression 12/23/17 left anterior tibia. Not close to close this week apparently the [facility] was stuck to this again. Still circumferential eschar requiring debridement. I put a contact layer on this this time under the  Hydrofera Blue 12/31/17; left anterior tibia. Wound is better slight amount of hyper-granulation. Using Hydrofera Blue over Adaptic. 01/07/18; left anterior tibia. The wound had some surface eschar however after this was removed he has no open wound.he was already revascularized by Dr. [name] when he came to our clinic with atherectomy of the left SFA and popliteal artery. He was also sent to interventional radiology for venous reflux studies. He was not felt to have significant reflux but certainly has chronic venous changes of his skin with hemosiderin deposition around this area. He will definitely need to lubricate his skin and wear compression stocking and I've talked to him about this. READMISSION 05/26/2018 This is a now 71 year old man we cared for with traumatic wounds on his left anterior lower extremity. He had been previously revascularized during that admission by Dr. [name]. Apparently in follow-up Dr. [name] noted that he had deterioration in his arterial status. He underwent a stent placement in the distal left SFA on 04/22/2018. 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 He went back to the angiography suite on 04/30/2018 he underwent PTA and balloon  angioplasty of the occluded left mid anterior tibial artery, thrombotic occlusion went from 100 to 0% which reconstitutes the posterior tibial artery. He had thrombectomy and aspiration of the peroneal artery. The stent placed in the distal SFA left SFA was still occluded. 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 He has been recuperating at [facility] after the most recent hospitalization. He is going home tomorrow. He tells me that 2 weeks ago he traumatized the tip of his left fifth toe. He came in urgently for our review of this. This was a history of before I noted that Dr. [name] had already noted dry gangrenous changes of the left fifth toe 06/09/2018; 2-week follow-up. I did contact Dr. [name] after his last appointment and he apparently saw 1 of Dr. Irven Shelling colleagues the next day. He does not follow-up with Dr. [name] himself until Thursday of this week. 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 Nevertheless there is no evidence of infection no drainage and no pain. He had a new area that this week when we were signing him in today on the left anterior mid tibia area, this is in close proximity to the previous wound we have dealt with in this clinic. 06/23/2018; 2-week follow-up. I did not receive a recent note from Dr. [name] to review today. Our office is trying to obtain this. He is apparently not planning to do further vascular interventions and wondered about compression to try and help with the patient's chronic venous insufficiency. However we are also concerned about the arterial flow. ooHe arrives in clinic today with a new area on the left third toe. The areas on the calf/anterior tibia are  close to closing. The left fifth toe is still mummified using Betadine. -In reviewing things with the patient he has what sounds like claudication with mild to moderate amount of activity. 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 I prescribed Levaquin over the phone while we attempted to arrange a plan of care. However the patient called yesterday to report he had  low-grade fever and he came in today acutely. There is been a marked deterioration in the left third toe with spreading cellulitis up into the dorsal left foot. He was referred to the emergency room. Readmission: 06/29/2020 patient presents today for reevaluation here in our clinic he was previously treated by Dr. Dellia Nims at the latter part of 2019 in 2 the beginning of 2020. Subsequently we have not seen him since that time in the interim he did have evaluation with vein and vascular specialist specifically Dr. Anice Paganini who did perform quite extensive work for a left femoral to anterior tibial artery bypass. With that being said in the interim the patient has developed significant lymphedema and has wounds that he tells me have really never healed in regard to the incision site on the left leg. He also has multiple wounds on the feet for various reasons some of which is that he tends to pick at his feet. Fortunately there is no signs of active infection systemically at this time he does have some wounds that are little bit deeper but most are fairly superficial he seems to have good blood flow and overall everything appears to be healthy I see no bone exposed and no obvious signs of osteomyelitis. I do not know that he necessarily needs a x-ray at this point although that something we could consider depending on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

## 2021-11-23 ENCOUNTER — Encounter (HOSPITAL_BASED_OUTPATIENT_CLINIC_OR_DEPARTMENT_OTHER): Payer: Medicare Other | Attending: General Surgery | Admitting: General Surgery

## 2021-11-23 DIAGNOSIS — L97828 Non-pressure chronic ulcer of other part of left lower leg with other specified severity: Secondary | ICD-10-CM | POA: Insufficient documentation

## 2021-11-23 DIAGNOSIS — L97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: Secondary | ICD-10-CM | POA: Insufficient documentation

## 2021-11-23 DIAGNOSIS — E11621 Type 2 diabetes mellitus with foot ulcer: Secondary | ICD-10-CM | POA: Diagnosis not present

## 2021-11-23 DIAGNOSIS — L97528 Non-pressure chronic ulcer of other part of left foot with other specified severity: Secondary | ICD-10-CM | POA: Diagnosis not present

## 2021-11-23 DIAGNOSIS — I87332 Chronic venous hypertension (idiopathic) with ulcer and inflammation of left lower extremity: Secondary | ICD-10-CM | POA: Insufficient documentation

## 2021-11-23 DIAGNOSIS — I890 Lymphedema, not elsewhere classified: Secondary | ICD-10-CM | POA: Diagnosis not present

## 2021-11-23 DIAGNOSIS — E1151 Type 2 diabetes mellitus with diabetic peripheral angiopathy without gangrene: Secondary | ICD-10-CM | POA: Insufficient documentation

## 2021-11-23 NOTE — Progress Notes (Signed)
EARNEST, MCGILLIS ([id number]) Visit Report for 11/23/2021 Arrival Information Details Patient Name: Date of Service: [name], [name] 11/23/2021 10:45 A M Medical Record Number: [id number] Patient Account Number: [id number] Date of Birth/Sex: Treating RN: 1951-05-12 (71 y.o. [name] Primary Care Mohamad Bruso: [name] Other Clinician: Referring Syd Manges: Treating Cleavon Goldman/Extender: [name] in Treatment: 62 Visit Information History Since Last Visit Added or deleted any medications: No Patient Arrived: Ambulatory Any new allergies or adverse reactions: No Arrival Time: 10:49 Had a fall or experienced change in No Accompanied By: self activities of daily living that may affect Transfer Assistance: None risk of falls: Patient Identification Verified: Yes Signs or symptoms of abuse/neglect since last visito No Secondary Verification Process Completed: Yes Hospitalized since last visit: No Patient Requires Transmission-Based Precautions: No Implantable device outside of the clinic excluding No Patient Has Alerts: Yes cellular tissue based products placed in the center since last visit: Has Dressing in Place as Prescribed: Yes Pain Present Now: No Electronic Signature(s) Signed: 11/23/2021 5:16:25 PM By: Adline Peals Entered By: Adline Peals on 11/23/2021 10:52:37 -------------------------------------------------------------------------------- Compression Therapy Details Patient Name: Date of Service: [name]. 11/23/2021 10:45 A M Medical Record Number: [id number] Patient Account Number: [id number] Date of Birth/Sex: Treating RN: 1950/09/04 (71 y.o. [name] Primary Care Chrisette Man: [name] Other Clinician: Referring Edris Friedt: Treating Vicie Cech/Extender: [name] in Treatment: 32 Compression Therapy Performed for Wound Assessment: Wound #22 Left,Lateral Lower Leg Performed By:  Clinician Adline Peals, RN Compression Type: Double Layer Post Procedure Diagnosis Same as Pre-procedure Electronic Signature(s) Signed: 11/23/2021 5:16:25 PM By: Sabas Sous By: Adline Peals on 11/23/2021 12:00:34 -------------------------------------------------------------------------------- Encounter Discharge Information Details Patient Name: Date of Service: [name]. 11/23/2021 10:45 A M Medical Record Number: [id number] Patient Account Number: [id number] Date of Birth/Sex: Treating RN: November 24, 1950 (71 y.o. [name] Primary Care Kinleigh Nault: [name] Other Clinician: Referring Lasandra Batley: Treating Adiba Fargnoli/Extender: [name] in Treatment: 32 Encounter Discharge Information Items Post Procedure Vitals Discharge Condition: Stable Temperature (F): 98 Ambulatory Status: Ambulatory Pulse (bpm): 62 Discharge Destination: Home Respiratory Rate (breaths/min): 20 Transportation: Private Auto Blood Pressure (mmHg): 125/74 Accompanied By: self Schedule Follow-up Appointment: Yes Clinical Summary of Care: Patient Declined Electronic Signature(s) Signed: 11/23/2021 5:16:25 PM By: Adline Peals Entered By: Adline Peals on 11/23/2021 12:01:32 -------------------------------------------------------------------------------- Lower Extremity Assessment Details Patient Name: Date of Service: [name]. 11/23/2021 10:45 A M Medical Record Number: [id number] Patient Account Number: [id number] Date of Birth/Sex: Treating RN: 18-Feb-1951 (71 y.o. [name] Primary Care Derhonda Eastlick: [name] Other Clinician: Referring Zaire Levesque: Treating Nachum Derossett/Extender: [name] in Treatment: 32 Edema Assessment Assessed: [name]: No] [name]: No] Edema: [Left: Ye] [Right: s] Calf Left: Right: Point of Measurement: 41 cm From Medial Instep 44 cm Ankle Left: Right: Point of  Measurement: 10 cm From Medial Instep 30.7 cm Vascular Assessment Pulses: Dorsalis Pedis Palpable: [Left:Yes] Electronic Signature(s) Signed: 11/23/2021 5:16:25 PM By: Adline Peals Entered By: Adline Peals on 11/23/2021 10:57:41 -------------------------------------------------------------------------------- Multi Wound Chart Details Patient Name: Date of Service: [name]. 11/23/2021 10:45 A M Medical Record Number: [id number] Patient Account Number: [id number] Date of Birth/Sex: Treating RN: 04/02/1951 (71 y.o. 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 Primary Etiology: Extremity Glaucoma, Sleep Apnea, Glaucoma, Sleep Apnea, Glaucoma, Sleep Apnea, Comorbid History: Hypertension, Peripheral Arterial Hypertension, Peripheral Arterial Hypertension, Peripheral Arterial Disease, Peripheral Venous Disease, Disease, Peripheral Venous Disease, Disease, Peripheral Venous Disease, Type II Diabetes, Gout, Osteoarthritis, Type II Diabetes, Gout, Osteoarthritis, Type II Diabetes, Gout, Osteoarthritis, Neuropathy Neuropathy Neuropathy 08/23/2020 06/03/2021 09/14/2021 Date Acquired: 32 24 9 Weeks of Treatment: Open Open Open Wound Status: No No Yes Wound Recurrence: 0.8x0.7x0.1 13.7x2.8x1 3.2x1.3x0.1 Measurements L x W x D (cm) 0.44 30.128 3.267 A (cm) : rea 0.044 30.128 0.327 Volume (cm) : 96.90% -1727.00% -98.10% %  Reduction in A rea: 98.40% -2184.20% -98.20% % Reduction in Volume: 1 Position 1 (o'clock): 3.9 Maximum Distance 1 (cm): No Yes No Tunneling: Grade 2 Full Thickness With Exposed Support Full Thickness Without Exposed Classification: Structures Support Structures Medium Large Medium Exudate A mount: Serosanguineous Serosanguineous Serosanguineous Exudate Type: red, brown red, brown red, brown Exudate Color: Flat and Intact Distinct, outline attached Distinct, outline attached Wound Margin: Large (67-100%) Large (67-100%) Large (67-100%) Granulation A mount: Red Red Red Granulation Quality: None Present (0%) Small (1-33%) Small (1-33%) Necrotic A mount: Fat Layer (Subcutaneous Tissue): Yes Fat Layer (Subcutaneous Tissue): Yes Fat Layer (Subcutaneous Tissue): Yes Exposed Structures: Fascia: No Fascia: No Fascia: No Tendon: No Tendon: No Tendon: No Muscle: No Muscle: No Muscle: No Joint: No Joint: No Joint: No Bone: No Bone: No Bone: No Medium (34-66%) Small (1-33%) Medium (34-66%) Epithelialization: Debridement - Selective/Open Wound Debridement - Excisional Debridement - Excisional Debridement: Pre-procedure Verification/Time Out 11:15 11:15 11:15 Taken: Other Other Other Pain Control: Callus Subcutaneous, Slough Subcutaneous, Slough Tissue Debrided: Skin/Epidermis Skin/Subcutaneous Tissue Skin/Subcutaneous Tissue Level: 1 38.36 4.16 Debridement A (sq cm): rea Curette Curette Curette Instrument: Minimum Minimum Minimum Bleeding: Pressure Pressure Pressure Hemostasis A chieved: 0 0 0 Procedural Pain: 0 0 0 Post Procedural Pain: Procedure was tolerated well Procedure was tolerated well Procedure was tolerated well Debridement Treatment Response: 0.8x0.7x0.1 13.7x2.8x1 3.2x1.3x0.1 Post Debridement Measurements L x W x D (cm) 0.044 30.128 0.327 Post Debridement Volume: (cm) Debridement Compression Therapy Debridement Procedures  Performed: Debridement Treatment Notes Wound #18 (Metatarsal head first) Wound Laterality: Plantar, Left Cleanser Soap and Water Discharge Instruction: May shower and wash wound with dial antibacterial soap and water prior to dressing change. Wound Cleanser Discharge Instruction: Cleanse the wound with wound cleanser prior to applying a clean dressing using gauze sponges, not tissue or cotton balls. Peri-Wound Care Triamcinolone 15 (g) Discharge Instruction: Use triamcinolone 15 (g) as directed Sween Lotion (Moisturizing lotion) Discharge Instruction: Apply moisturizing lotion as directed Topical Primary Dressing Promogran Prisma Matrix, 4.34 (sq in) (silver collagen) Discharge Instruction: Moisten collagen with saline or hydrogel Secondary Dressing Woven Gauze Sponge, Non-Sterile 4x4 in Discharge Instruction: Apply over primary dressing as directed. Secured With 83M Medipore H Soft Cloth Surgical T ape, 4 x 10 (in/yd) Discharge Instruction: Secure with tape as directed. Compression Wrap CoFlex TLC XL 2-layer Compression System 4x7 (in/yd) Discharge Instruction: Apply CoFlex 2-layer compression as directed. (alt for 4 layer) Compression Stockings Add-Ons Wound #22 (Lower Leg) Wound Laterality: Left, Lateral Cleanser Soap and Water Discharge Instruction: May shower and wash wound with dial antibacterial soap and water prior to dressing change. Wound Cleanser Discharge Instruction: Cleanse the wound with wound cleanser prior to applying a clean dressing using  gauze sponges, not tissue or cotton balls. Peri-Wound Care Topical Primary Dressing VAC Secondary Dressing Secured With Compression Wrap Compression Stockings Add-Ons Wound #24R (Upper Leg) Wound Laterality: Left, Medial Cleanser Wound Cleanser Discharge Instruction: Cleanse the wound with wound cleanser prior to applying a clean dressing using gauze sponges, not tissue or cotton balls. Peri-Wound Care Sween Lotion  (Moisturizing lotion) Discharge Instruction: Apply moisturizing lotion as directed Topical Primary Dressing KerraCel Ag Gelling Fiber Dressing, 2x2 in (silver alginate) Discharge Instruction: Apply silver alginate to wound bed as instructed Secondary Dressing Bordered Gauze, 4x4 in Discharge Instruction: Apply over primary dressing as directed. Secured With Elastic Bandage 4 inch (ACE bandage) Discharge Instruction: Secure with ACE bandage as directed. Compression Wrap Compression Stockings Add-Ons Electronic Signature(s) Signed: 11/23/2021 12:01:47 PM By: Fredirick Maudlin MD FACS Signed: 11/23/2021 5:16:25 PM By: Sabas Sous By: Fredirick Maudlin on 11/23/2021 12:01:47 -------------------------------------------------------------------------------- Multi-Disciplinary Care Plan Details Patient Name: Date of Service: [name]. 11/23/2021 10:45 A M Medical Record Number: [id number] Patient Account Number: [id number] Date of Birth/Sex: Treating RN: Oct 24, 1950 (71 y.o. [name] Primary Care Stacey Maura: [name] Other Clinician: Referring Isabele Lollar: Treating Neel Buffone/Extender: [name] in Treatment: 80 Multidisciplinary Care Plan reviewed with physician Active Inactive Venous Leg Ulcer Nursing Diagnoses: Knowledge deficit related to disease process and management Potential for venous Insuffiency (use before diagnosis confirmed) Goals: Patient will maintain optimal edema control Date Initiated: 07/27/2021 Target Resolution Date: 12/14/2021 Goal Status: Active Interventions: Assess peripheral edema status every visit. Treatment Activities: Therapeutic compression applied : 07/27/2021 Notes: Wound/Skin Impairment Nursing Diagnoses: Impaired tissue integrity Knowledge deficit related to ulceration/compromised skin integrity Goals: Patient will have a decrease in wound volume by X% from date: (specify in notes) Date  Initiated: 04/12/2021 Date Inactivated: 04/27/2021 Target Resolution Date: 04/23/2021 Goal Status: Met Patient/caregiver will verbalize understanding of skin care regimen Date Initiated: 04/12/2021 Target Resolution Date: 12/14/2021 Goal Status: Active Ulcer/skin breakdown will have a volume reduction of 30% by week 4 Date Initiated: 04/12/2021 Date Inactivated: 04/27/2021 Target Resolution Date: 04/27/2021 Goal Status: Unmet Unmet Reason: infection Ulcer/skin breakdown will have a volume reduction of 50% by week 8 Date Initiated: 04/27/2021 Date Inactivated: 06/29/2021 Target Resolution Date: 06/24/2021 Goal Status: Met Interventions: Assess patient/caregiver ability to obtain necessary supplies Assess patient/caregiver ability to perform ulcer/skin care regimen upon admission and as needed Assess ulceration(s) every visit Notes: Electronic Signature(s) Signed: 11/23/2021 5:16:25 PM By: Adline Peals Entered By: Adline Peals on 11/23/2021 11:05:21 -------------------------------------------------------------------------------- Pain Assessment Details Patient Name: Date of Service: [name]. 11/23/2021 10:45 A M Medical Record Number: [id number] Patient Account Number: [id number] Date of Birth/Sex: Treating RN: [birth year] (71 y.o. [name] Primary Care Gwendalyn Mcgonagle: [name] Other Clinician: Referring Jameshia Hayashida: Treating Tyese Finken/Extender: [name] in Treatment: 32 Active Problems Location of Pain Severity and Description of Pain Patient Has Paino No Site Locations Rate the pain. Current Pain Level: 0 Pain Management and Medication Current Pain Management: Electronic Signature(s) Signed: 11/23/2021 5:16:25 PM By: Adline Peals Entered By: Adline Peals on 11/23/2021 10:52:45 -------------------------------------------------------------------------------- Patient/Caregiver Education Details Patient Name: Date  of Service: [name] 6/1/2023andnbsp10:45 A M Medical Record Number: [id number] Patient Account Number: [id number] Date of Birth/Gender: Treating RN: 11-24-1950 (71 y.o. [name] Primary Care Physician: [name] Other Clinician: Referring Physician: Treating Physician/Extender: [name] in Treatment: 54 Education Assessment Education Provided To: Patient Education Topics Provided Wound/Skin Impairment: Methods: Explain/Verbal Responses: Reinforcements needed, State content correctly  Electronic Signature(s) Signed: 11/23/2021 5:16:25 PM By: Adline Peals Entered By: Adline Peals on 11/23/2021 11:05:41 -------------------------------------------------------------------------------- Wound Assessment Details Patient Name: Date of Service: [name], [name] 11/23/2021 10:45 A M Medical Record Number: [id number] Patient Account Number: [id number] Date of Birth/Sex: Treating RN: 1950-10-23 (71 y.o. [name] Primary Care Amaryllis Malmquist: [name] Other Clinician: Referring Mahari Vankirk: Treating Airiel Oblinger/Extender: [name] in Treatment: 32 Wound Status Wound Number: 18 Primary Diabetic Wound/Ulcer of the Lower Extremity Etiology: Wound Location: Left, Plantar Metatarsal head first Wound Open Wounding Event: Gradually Appeared Status: Date Acquired: 08/23/2020 Comorbid Glaucoma, Sleep Apnea, Hypertension, Peripheral Arterial Disease, Weeks Of Treatment: 32 History: Peripheral Venous Disease, Type II Diabetes, Gout, Osteoarthritis, Clustered Wound: No Neuropathy Photos Wound Measurements Length: (cm) 0.8 Width: (cm) 0.7 Depth: (cm) 0.1 Area: (cm) 0.44 Volume: (cm) 0.044 % Reduction in Area: 96.9% % Reduction in Volume: 98.4% Epithelialization: Medium (34-66%) Tunneling: No Undermining: No Wound Description Classification: Grade 2 Wound Margin: Flat and Intact Exudate Amount:  Medium Exudate Type: Serosanguineous Exudate Color: red, brown Foul Odor After Cleansing: No Slough/Fibrino No Wound Bed Granulation Amount: Large (67-100%) Exposed Structure Granulation Quality: Red Fascia Exposed: No Necrotic Amount: None Present (0%) Fat Layer (Subcutaneous Tissue) Exposed: Yes Tendon Exposed: No Muscle Exposed: No Joint Exposed: No Bone Exposed: No Treatment Notes Wound #18 (Metatarsal head first) Wound Laterality: Plantar, Left Cleanser Soap and Water Discharge Instruction: May shower and wash wound with dial antibacterial soap and water prior to dressing change. Wound Cleanser Discharge Instruction: Cleanse the wound with wound cleanser prior to applying a clean dressing using gauze sponges, not tissue or cotton balls. Peri-Wound Care Triamcinolone 15 (g) Discharge Instruction: Use triamcinolone 15 (g) as directed Sween Lotion (Moisturizing lotion) Discharge Instruction: Apply moisturizing lotion as directed Topical Primary Dressing Promogran Prisma Matrix, 4.34 (sq in) (silver collagen) Discharge Instruction: Moisten collagen with saline or hydrogel Secondary Dressing Woven Gauze Sponge, Non-Sterile 4x4 in Discharge Instruction: Apply over primary dressing as directed. Secured With 53M Medipore H Soft Cloth Surgical T ape, 4 x 10 (in/yd) Discharge Instruction: Secure with tape as directed. Compression Wrap CoFlex TLC XL 2-layer Compression System 4x7 (in/yd) Discharge Instruction: Apply CoFlex 2-layer compression as directed. (alt for 4 layer) Compression Stockings Add-Ons Electronic Signature(s) Signed: 11/23/2021 5:16:25 PM By: Adline Peals Entered By: Adline Peals on 11/23/2021 11:09:39 -------------------------------------------------------------------------------- Wound Assessment Details Patient Name: Date of Service: [name]. 11/23/2021 10:45 A M Medical Record Number: [id number] Patient Account Number: [id number] Date of  Birth/Sex: Treating RN: 18-Jan-1951 (71 y.o. [name] Primary Care Imagene Boss: [name] Other Clinician: Referring Daiton Cowles: Treating Ettie Krontz/Extender: [name] in Treatment: 32 Wound Status Wound Number: 22 Primary Cyst Etiology: Wound Location: Left, Lateral Lower Leg Wound Open Wounding Event: Bump Status: Date Acquired: 06/03/2021 Comorbid Glaucoma, Sleep Apnea, Hypertension, Peripheral Arterial Disease, Weeks Of Treatment: 24 History: Peripheral Venous Disease, Type II Diabetes, Gout, Osteoarthritis, Clustered Wound: No Neuropathy Photos Wound Measurements Length: (cm) 13.7 Width: (cm) 2.8 Depth: (cm) 1 Area: (cm) 30.128 Volume: (cm) 30.128 % Reduction in Area: -1727% % Reduction in Volume: -2184.2% Epithelialization: Small (1-33%) Tunneling: Yes Position (o'clock): 1 Maximum Distance: (cm) 3.9 Undermining: No Wound Description Classification: Full Thickness With Exposed Support Structures Wound Margin: Distinct, outline attached Exudate Amount: Large Exudate Type: Serosanguineous Exudate Color: red, brown Foul Odor After Cleansing: No Slough/Fibrino Yes Wound Bed Granulation Amount: Large (67-100%) Exposed Structure Granulation Quality: Red Fascia Exposed: No Necrotic Amount: Small (1-33%) Fat Layer (  Subcutaneous Tissue) Exposed: Yes Necrotic Quality: Adherent Slough Tendon Exposed: No Muscle Exposed: No Joint Exposed: No Bone Exposed: No Treatment Notes Wound #22 (Lower Leg) Wound Laterality: Left, Lateral Cleanser Soap and Water Discharge Instruction: May shower and wash wound with dial antibacterial soap and water prior to dressing change. Wound Cleanser Discharge Instruction: Cleanse the wound with wound cleanser prior to applying a clean dressing using gauze sponges, not tissue or cotton balls. Peri-Wound Care Topical Primary Dressing VAC Secondary Dressing Secured With Compression  Wrap Compression Stockings Add-Ons Electronic Signature(s) Signed: 11/23/2021 5:16:25 PM By: Adline Peals Entered By: Adline Peals on 11/23/2021 11:10:30 -------------------------------------------------------------------------------- Wound Assessment Details Patient Name: Date of Service: [name]. 11/23/2021 10:45 A M Medical Record Number: [id number] Patient Account Number: [id number] Date of Birth/Sex: Treating RN: [birth year] (72 y.o. [name] Primary Care Torsha Lemus: [name] Other Clinician: Referring Kawena Lyday: Treating Cadyn Rodger/Extender: [name] in Treatment: 32 Wound Status Wound Number: 24R Primary Abrasion Etiology: Wound Location: Left, Medial Upper Leg Wound Open Wounding Event: Trauma Status: Date Acquired: 09/14/2021 Comorbid Glaucoma, Sleep Apnea, Hypertension, Peripheral Arterial Disease, Weeks Of Treatment: 9 History: Peripheral Venous Disease, Type II Diabetes, Gout, Osteoarthritis, Clustered Wound: No Neuropathy Photos Wound Measurements Length: (cm) 3.2 Width: (cm) 1.3 Depth: (cm) 0.1 Area: (cm) 3.267 Volume: (cm) 0.327 % Reduction in Area: -98.1% % Reduction in Volume: -98.2% Epithelialization: Medium (34-66%) Tunneling: No Undermining: No Wound Description Classification: Full Thickness Without Exposed Support Structures Wound Margin: Distinct, outline attached Exudate Amount: Medium Exudate Type: Serosanguineous Exudate Color: red, brown Foul Odor After Cleansing: No Slough/Fibrino Yes Wound Bed Granulation Amount: Large (67-100%) Exposed Structure Granulation Quality: Red Fascia Exposed: No Necrotic Amount: Small (1-33%) Fat Layer (Subcutaneous Tissue) Exposed: Yes Necrotic Quality: Adherent Slough Tendon Exposed: No Muscle Exposed: No Joint Exposed: No Bone Exposed: No Treatment Notes Wound #24R (Upper Leg) Wound Laterality: Left, Medial Cleanser Wound  Cleanser Discharge Instruction: Cleanse the wound with wound cleanser prior to applying a clean dressing using gauze sponges, not tissue or cotton balls. Peri-Wound Care Sween Lotion (Moisturizing lotion) Discharge Instruction: Apply moisturizing lotion as directed Topical Primary Dressing KerraCel Ag Gelling Fiber Dressing, 2x2 in (silver alginate) Discharge Instruction: Apply silver alginate to wound bed as instructed Secondary Dressing Bordered Gauze, 4x4 in Discharge Instruction: Apply over primary dressing as directed. Secured With Elastic Bandage 4 inch (ACE bandage) Discharge Instruction: Secure with ACE bandage as directed. Compression Wrap Compression Stockings Add-Ons Electronic Signature(s) Signed: 11/23/2021 5:16:25 PM By: Adline Peals Entered By: Adline Peals on 11/23/2021 11:10:04 -------------------------------------------------------------------------------- Vitals Details Patient Name: Date of Service: [name]. 11/23/2021 10:45 A M Medical Record Number: [id number] Patient Account Number: [id number] Date of Birth/Sex: Treating RN: 1950-11-11 (71 y.o. [name] Primary Care Marcianna Daily: [name] Other Clinician: Referring Alynna Hargrove: Treating Drema Eddington/Extender: [name] in Treatment: 32 Vital Signs Time Taken: 10:52 Temperature (F): 98 Height (in): 74 Pulse (bpm): 62 Weight (lbs): 238 Respiratory Rate (breaths/min): 20 Body Mass Index (BMI): 30.6 Blood Pressure (mmHg): 125/74 Capillary Blood Glucose (mg/dl): 113 Reference Range: 80 - 120 mg / dl Electronic Signature(s) Signed: 11/23/2021 5:16:25 PM By: Adline Peals Entered By: Adline Peals on 11/23/2021 10:54:39

## 2021-11-23 NOTE — Progress Notes (Signed)
FAARIS, ARIZPE ([id number]) Visit Report for 11/23/2021 Chief Complaint Document Details Patient Name: Date of Service: [name], [name] 11/23/2021 10:45 A M Medical Record Number: [id number] Patient Account Number: [id number] Date of Birth/Sex: Treating RN: 13-Jun-1951 (71 y.o. [name] Primary Care Provider: [name] Other Clinician: Referring Provider: Treating Provider/Extender: [name] in Treatment: 32 Information Obtained from: Patient Chief Complaint Left leg and foot ulcers 04/12/2021; patient is here for wounds on his left lower leg and left plantar foot over the first metatarsal head Electronic Signature(s) Signed: 11/23/2021 12:01:57 PM By: Fredirick Maudlin MD FACS Entered By: Fredirick Maudlin on 11/23/2021 12:01:56 -------------------------------------------------------------------------------- Debridement Details Patient Name: Date of Service: [name]. 11/23/2021 10:45 A M Medical Record Number: [id number] Patient Account Number: [id number] Date of Birth/Sex: Treating RN: [birth year] (71 y.o. [name] Primary Care Provider: [name] Other Clinician: Referring Provider: Treating Provider/Extender: [name] in Treatment: 32 Debridement Performed for Assessment: Wound #18 Left,Plantar Metatarsal head first Performed By: Physician Fredirick Maudlin, MD Debridement Type: Debridement Severity of Tissue Pre Debridement: Fat layer exposed Level of Consciousness (Pre-procedure): Awake and Alert Pre-procedure Verification/Time Out Yes - 11:15 Taken: Start Time: 11:15 Pain Control: Other : benzocaine 20% T Area Debrided (L x W): otal 1 (cm) x 1 (cm) = 1 (cm) Tissue and other material debrided: Non-Viable, Callus, Skin: Epidermis, Biofilm Level: Skin/Epidermis Debridement Description: Selective/Open Wound Instrument: Curette Bleeding: Minimum Hemostasis Achieved: Pressure Procedural  Pain: 0 Post Procedural Pain: 0 Response to Treatment: Procedure was tolerated well Level of Consciousness (Post- Awake and Alert procedure): Post Debridement Measurements of Total Wound Length: (cm) 0.8 Width: (cm) 0.7 Depth: (cm) 0.1 Volume: (cm) 0.044 Character of Wound/Ulcer Post Debridement: Improved Severity of Tissue Post Debridement: Fat layer exposed Post Procedure Diagnosis Same as Pre-procedure Electronic Signature(s) Signed: 11/23/2021 12:38:17 PM By: Fredirick Maudlin MD FACS Signed: 11/23/2021 5:16:25 PM By: Adline Peals Entered By: Adline Peals on 11/23/2021 11:17:22 -------------------------------------------------------------------------------- Debridement Details Patient Name: Date of Service: [name]. 11/23/2021 10:45 A M Medical Record Number: [id number] Patient Account Number: [id number] Date of Birth/Sex: Treating RN: 23-Feb-1951 (71 y.o. [name] Primary Care Provider: [name] Other Clinician: Referring Provider: Treating Provider/Extender: [name] in Treatment: 32 Debridement Performed for Assessment: Wound #24R Left,Medial Upper Leg Performed By: Physician Fredirick Maudlin, MD Debridement Type: Debridement Level of Consciousness (Pre-procedure): Awake and Alert Pre-procedure Verification/Time Out Yes - 11:15 Taken: Start Time: 11:15 Pain Control: Other : benzocaine 20% T Area Debrided (L x W): otal 3.2 (cm) x 1.3 (cm) = 4.16 (cm) Tissue and other material debrided: Viable, Non-Viable, Slough, Subcutaneous, Slough Level: Skin/Subcutaneous Tissue Debridement Description: Excisional Instrument: Curette Bleeding: Minimum Hemostasis Achieved: Pressure Procedural Pain: 0 Post Procedural Pain: 0 Response to Treatment: Procedure was tolerated well Level of Consciousness (Post- Awake and Alert procedure): Post Debridement Measurements of Total Wound Length: (cm) 3.2 Width: (cm)  1.3 Depth: (cm) 0.1 Volume: (cm) 0.327 Character of Wound/Ulcer Post Debridement: Improved Post Procedure Diagnosis Same as Pre-procedure Electronic Signature(s) Signed: 11/23/2021 12:38:17 PM By: Fredirick Maudlin MD FACS Signed: 11/23/2021 5:16:25 PM By: Adline Peals Entered By: Adline Peals on 11/23/2021 11:18:46 -------------------------------------------------------------------------------- Debridement Details Patient Name: Date of Service: [name]. 11/23/2021 10:45 A M Medical Record Number: [id number] Patient Account Number: [id number] Date of Birth/Sex: Treating RN: [birth year] (71 y.o. [name] Primary Care Provider: Other Clinician: [name] Referring Provider: Treating Provider/Extender: [name],  [name] in Treatment: 32 Debridement Performed for Assessment: Wound #22 Left,Lateral Lower Leg Performed By: Physician Fredirick Maudlin, MD Debridement Type: Debridement Level of Consciousness (Pre-procedure): Awake and Alert Pre-procedure Verification/Time Out Yes - 11:15 Taken: Start Time: 11:15 Pain Control: Other : benzocaine 20% T Area Debrided (L x W): otal 13.7 (cm) x 2.8 (cm) = 38.36 (cm) Tissue and other material debrided: Viable, Non-Viable, Slough, Subcutaneous, Slough Level: Skin/Subcutaneous Tissue Debridement Description: Excisional Instrument: Curette Bleeding: Minimum Hemostasis Achieved: Pressure Procedural Pain: 0 Post Procedural Pain: 0 Response to Treatment: Procedure was tolerated well Level of Consciousness (Post- Awake and Alert procedure): Post Debridement Measurements of Total Wound Length: (cm) 13.7 Width: (cm) 2.8 Depth: (cm) 1 Volume: (cm) 30.128 Character of Wound/Ulcer Post Debridement: Improved Post Procedure Diagnosis Same as Pre-procedure Electronic Signature(s) Signed: 11/23/2021 12:38:17 PM By: Fredirick Maudlin MD FACS Signed: 11/23/2021 5:16:25 PM By: Adline Peals Entered By: Adline Peals on 11/23/2021 11:19:43 -------------------------------------------------------------------------------- HPI Details Patient Name: Date of Service: [name]. 11/23/2021 10:45 A M Medical Record Number: [id number] Patient Account Number: [id number] Date of Birth/Sex: Treating RN: February 10, 1951 (71 y.o. [name] Primary Care Provider: [name] Other Clinician: Referring Provider: Treating Provider/Extender: [name] in Treatment: 32 History of Present Illness HPI Description: 10/11/17; [name] is a 71 year old man who tells me that in 2015 he slipped down the latter traumatizing his left leg. He developed a wound in the same spot the area that we are currently looking at. He states this closed over for the most part although he always felt it was somewhat unstable. In 2016 he hit the same area with the door of his car had this reopened. He tells me that this is never really closed although sometimes an inflow it remains open on a constant basis. He has not been using any specific dressing to this except for topical antibiotics the nature of which were not really sure. His primary doctor did send him to see Dr. [name] of interventional cardiology. He underwent an angiogram on 08/06/17 and he underwent a PTA and directional atherectomy of the lesser distal SFA and popliteal arteries which resulted in brisk improvement in blood flow. It was noted that he had 2 vessel runoff through the anterior tibial and peroneal. He is also been to see vascular and interventional radiologist. He was not felt to have any significant superficial venous insufficiency. Presumably is not a candidate for any ablation. It was suggested he come here for wound care. The patient is a type II diabetic on insulin. He also has a history of venous insufficiency. ABIs on the left were noncompressible in our clinic 10/21/17; patient we admitted to  the clinic last week. 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 We put Iodosorb on him after an aggressive debridement and 3 layer compression. He complained of pain in his ankle and itching with is skin in fact he scratched the area on the medial calf superiorly at the rim of our wraps and he has 2 small open areas in that location today which are new. I changed his primary dressing today to silver collagen. As noted he is already had revascularization and does not have any significant superficial venous insufficiency that would be amenable to ablation 10/28/17; patient admitted to the clinic 2 weeks ago. He has a smaller Wound. Scratch injury from last week revealed. There is large wound over the tibial  area. This is smaller. Granulation looks healthy. No need for debridement. 11/04/17; the wound on the left lateral calf looks better. Improved dimensions. Surface of this looks better. We've been maintaining him and Kerlix Coban wraps. He finds this much more comfortable. Silver collagen dressing 11/11/17; left lateral Wound continues to look healthy be making progress. Using a #5 curet I removed removed nonviable skin from the surface of the wound and then necrotic debris from the wound surface. Surface of the wound continues to look healthy. He also has an open area on the left great toenail bed. We've been using topical antibiotics. 11/19/17; left anterior lateral wound continues to look healthy but it's not closed. He also had a small wound above this on the left leg Initially traumatic wounds in the setting of significant chronic venous insufficiency and stasis dermatitis 11/25/17; left anterior wounds superiorly is closed still a small wound inferiorly. 12/02/17; left anterior tibial area. Arrives today with adherent callus. Post debridement clearly not completely closed. Hydrofera Blue under 3 layer compression. 12/09/17; left  anterior tibia. Circumferential eschar however the wound bed looks stable to improved. We've been using Hydrofera Blue under 3 layer compression 12/17/17; left anterior tibia. Apparently this was felt to be closed however when the wrap was taken off there is a skin tear to reopen wounds in the same area we've been using Hydrofera Blue under 3 layer compression 12/23/17 left anterior tibia. Not close to close this week apparently the [facility] was stuck to this again. Still circumferential eschar requiring debridement. I put a contact layer on this this time under the Hydrofera Blue 12/31/17; left anterior tibia. Wound is better slight amount of hyper-granulation. Using Hydrofera Blue over Adaptic. 01/07/18; left anterior tibia. The wound had some surface eschar however after this was removed he has no open wound.he was already revascularized by Dr. [name] when he came to our clinic with atherectomy of the left SFA and popliteal artery. He was also sent to interventional radiology for venous reflux studies. He was not felt to have significant reflux but certainly has chronic venous changes of his skin with hemosiderin deposition around this area. He will definitely need to lubricate his skin and wear compression stocking and I've talked to him about this. READMISSION 05/26/2018 This is a now 71 year old man we cared for with traumatic wounds on his left anterior lower extremity. He had been previously revascularized during that admission by Dr. [name]. Apparently in follow-up Dr. [name] noted that he had deterioration in his arterial status. He underwent a stent placement in the distal left SFA on 04/22/2018. 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 He went back to the angiography suite on 04/30/2018 he underwent PTA and balloon angioplasty of the occluded left mid anterior tibial artery, thrombotic occlusion went from 100 to 0% which reconstitutes the posterior tibial artery. He had  thrombectomy and aspiration of the peroneal artery. The stent placed in the distal SFA left SFA was still occluded. 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 He has been recuperating at [place] home in [facility] after the most recent hospitalization. He is going home tomorrow. He tells me that 2 weeks ago he traumatized the tip of his left fifth toe. He came  in urgently for our review of this. This was a history of before I noted that Dr. [name] had already noted dry gangrenous changes of the left fifth toe 06/09/2018; 2-week follow-up. I did contact Dr. [name] after his last appointment and he apparently saw 1 of Dr. Irven Shelling colleagues the next day. He does not follow-up with Dr. [name] himself until Thursday of this week. 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 Nevertheless there is no evidence of infection no drainage and no pain. He had a new area that this week when we were signing him in today on the left anterior mid tibia area, this is in close proximity to the previous wound we have dealt with in this clinic. 06/23/2018; 2-week follow-up. I did not receive a recent note from Dr. [name] to review today. Our office is trying to obtain this. He is apparently not planning to do further vascular interventions and wondered about compression to try and help with the patient's chronic venous insufficiency. However we are also concerned about the arterial flow. He arrives in clinic today with a new area on the left third toe. The areas on the calf/anterior tibia are close to closing. The left fifth toe is still mummified using Betadine. -In reviewing things with the patient he has what sounds like claudication with mild to  moderate amount of activity. 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 I prescribed Levaquin over the phone while we attempted to arrange a plan of care. However the patient called yesterday to report he had low-grade fever and he came in today acutely. There is been a marked deterioration in the left third toe with spreading cellulitis up into the dorsal left foot. He was referred to the emergency room. Readmission: 06/29/2020 patient presents today for reevaluation here in our clinic he was previously treated by Dr. Dellia Nims at the latter part of 2019 in 2 the beginning of 2020. Subsequently we have not seen him since that time in the interim he did have evaluation with vein and vascular specialist specifically Dr. Anice Paganini who did perform quite extensive work for a left femoral to anterior tibial artery bypass. With that being said in the interim the patient has developed significant lymphedema and has wounds that he tells me have really never healed in regard to the incision site on the left leg. He also has multiple wounds on the feet for various reasons some of which is that he tends to pick at his feet. Fortunately there is no signs of active infection systemically at this time he does have some wounds that are little bit deeper but most are fairly superficial he seems to have good blood flow and overall everything appears to be healthy I see no bone exposed and no obvious signs of osteomyelitis. I do not know that he necessarily needs a x-ray at this point although that something we could consider depending on how things progress. The patient does have a history of lymphedema, diabetes, this is type II, chronic kidney disease stage III, hypertension, and history of peripheral vascular disease. 07/05/2020; patient admitted last week. Is a patient I remember from 2019 he had a spreading infection involving the left foot and we sent him to the hospital. 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 He subsequently had a left femoral to anterior tibial bypass by Dr.Cain vein and vascular. He also  has severe lymphedema with chronic skin changes related to that on the left leg. The most problematic area that was new today was on the left medial great toe. This was apparently a small area last week there was purulent drainage which our intake nurse cultured. Also areas on the left medial foot and heel left lateral foot. He has 2 areas on the left medial calf left lateral calf in the setting of the severe lymphedema. 07/13/2020 on evaluation today patient appears to be doing better in my opinion compared to his last visit. The good news is there is no signs of active infection systemically and locally I do not see any signs of infection either. He did have an x-ray which was negative that is great news he had a culture which showed MRSA but at the same time he is been on the doxycycline which has helped. I do think we may want to extend this for 7 additional days 1/25; patient admitted to the clinic a few weeks ago. 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 We have been putting him under compression his edema control is a lot better but he is severe verricused skin on the left leg. He is really done quite well he still has an open area on the left medial calf and the left medial first metatarsal head. We have been using silver collagen on the leg silver alginate on the foot 07/27/2020 upon evaluation today patient appears to be doing decently well in regard to his wounds. He still has a lot of dry skin on the left leg. Some of this is starting to peel back and I think he may be able to have them out by removing some that today. Fortunately there is no signs of active infection at this time on the left leg although on the right leg he does appear to have swelling and erythema as well as  some mild warmth to touch. This does have been concerned about the possibility of cellulitis although within the differential diagnosis I do think that potentially a DVT has to be at least considered. We need to rule that out before proceeding would just call in the cellulitis. Especially since he is having pain in the posterior aspect of his calf muscle. 2/8; the patient had seen sparingly. He has severe skin changes of chronic lymphedema in the left leg thickened hyperkeratotic verrucous skin. He has an open wound on the medial part of the left first met head left mid tibia. He also has a rim of nonepithelialized skin in the anterior mid tibia. He brought in the AmLactin lotion that was been prescribed although I am not sure under compression and its utility. There concern about cellulitis on the right lower leg the last time he was here. He was put on on antibiotics. His DVT rule out was negative. The right leg looks fine he is using his stocking on this area 08/10/2020 upon evaluation today patient appears to be doing well with regard to his leg currently. He has been tolerating the dressing changes without complication. Fortunately there is no signs of active infection which is great news. Overall very pleased with where things stand. 2/22; the patient still has an area on the medial part of the left first met his head. This looks better than when I last saw this earlier this month he has a rim of epithelialization but still some surface debris. Mostly everything on the left leg is  healed. There is still a vulnerable in the left mid tibia area. 08/30/2020 upon evaluation today patient appears to be doing much better in regard to his wounds on his foot. 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 Nonetheless I think we will address that today I Minna send in a  prescription for him in that regard. Overall though there does not appear to be any signs of significant worsening. 09/07/2020 on evaluation today patient's wounds over his left foot appear to be doing excellent. I do not see any signs of infection there is some callus buildup this can require debridement for certain but overall I feel like he is managing quite nicely. He still using the AmLactin cream which has been beneficial for him as well. 3/22; left foot wound is closed. There is no open area here. He is using ammonium lactate lotion to the lower extremities to help exfoliate dry cracked skin. He has compression stockings from elastic therapy in [place]. The wound on the medial part of his left first met head is healed today. READMISSION 04/12/2021 Mr. Clendenning is a patient we know fairly well he had a prolonged stay in clinic in 2019 with wounds on his left lateral and left anterior lower extremity in the setting of chronic venous insufficiency. More recently he was here earlier this year with predominantly an area on his left foot first metatarsal head plantar and he says the plantar foot broke down on its not long after we discharged him but he did not come back here. The last few months areas of broken down on his left anterior and again the left lateral lower extremity. The leg itself is very swollen chronically enlarged a lot of hyperkeratotic dry Berry Q skin in the left lower leg. His edema extends well into the thigh. He was seen by Dr. Donzetta Matters. He had ABIs on 03/02/2021 showing an ABI on the right of 1 with a TBI of 0.72 his ABI in the left at 1.09 TBI of 0.99. Monophasic and biphasic waveforms on the right. On the left monophasic waveforms were noted he went on to have an angiogram on 03/27/2021 this showed the aortic aortic and iliac segments were free of flow-limiting stenosis the left common femoral vein to evaluate the left femoral to anterior tibial artery bypass was unobstructed the  bypass was patent without any areas of stenosis. We discharged the patient in bilateral juxta lite stockings but very clearly that was not sufficient to control the swelling and maintain skin integrity. He is clearly going to need compression pumps. 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 This is fortunate because he is on his feet for long periods of time. 10/27; patient comes in with our intake nurse reporting copious amount of green drainage from the left anterior mid tibia the left dorsal foot and to a lesser extent the left medial mid tibia. We left the compression wrap on all week for the amount of edema in his left leg is quite a bit better. We use silver alginate as the primary dressing 11/3; edema control is good. Left anterior lower leg left medial lower leg and the plantar first metatarsal head. The left anterior lower leg required debridement. Deep tissue culture I did of this wound showed MRSA I put  him on 10 days of doxycycline which she will start today. We have him in compression wraps. He has a security card and [employer] however he is retiring on November 15. We will need to then get him into a better offloading boot for the left foot perhaps a total contact cast 11/10; edema control is quite good. Left anterior and left medial lower leg wounds in the setting of chronic venous insufficiency and lymphedema. He also has a substantial area over the left plantar first metatarsal head. 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 He has significant hypergranulation on the left plantar foot wound. The patient is a diabetic but he does not have significant PAD 11/17; edema control is quite good. Left anterior and left medial lower leg wounds look better. The really concerning area remains the area on the left plantar first metatarsal head. He has a  rim of epithelialization. 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 Starting today with a forefoot off loader but . possibly a total contact cast. 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 Arrives with a very significant odor from I think all of the wounds on the left leg and the left foot. Because of this I did not put a total contact cast on him today but will could still consider this. His wife was having cataract surgery which is the reason he missed the appointment 12/6. I saw this man 5 days ago with a swelling below the popliteal fossa. I thought he actually might have a Baker's cyst however the DVT rule out study that we could arrange right away was negative the technician told me this was not a ruptured Baker's cyst. We attempted to get this aspirated by under ultrasound guidance in interventional radiology however all they did was an ultrasound however it shows an extensive fluid collection 62 x 8 x 9.4 in the left thigh and left calf. The patient states he thinks this started 8 days ago or so but he really is not complaining of any pain, fever or systemic symptoms. He has not ha 12/20; after some difficulty I managed to get the patient into see Dr. Donzetta Matters. Eventually he was taken into the hospital and had a drain put in the fluid collection below his left knee posteriorly extending into the posterior thigh. He still has the drain in place. Culture of this showed moderate staff aureus few Morganella and few Klebsiella he is now on doxycycline and ciprofloxacin as suggested by infectious disease he is on this for a month. The drain will remain in place until it stops draining 12/29; he comes in today with the 1 wound on his left leg and the area on the left plantar first  met head significantly smaller. Both look healthy. He still has the drain in the left leg. He says he has to change this daily. Follows up with Dr. Donzetta Matters on January 11. 06/29/2021; the wounds that I am following on the left leg and left first met head continued to be quite healthy. However the area where his inferior drain is in place had copious amounts of drainage which was green in color.  The wound here is larger. Follows up with Dr. Gwenlyn Saran of vein and vascular his surgeon next week as well as infectious disease. He remains on ciprofloxacin and doxycycline. He is not complaining of excessive pain in either one of the drain areas 1/12; the patient saw vascular surgery and infectious disease. Vascular surgery has left the drain in place as there was still some notable drainage still see him back in 2 weeks. Dr. Velna Ochs stop the doxycycline and ciprofloxacin and I do not believe he follows up with them at this point. Culture I did last week showed both doxycycline resistant MRSA and Pseudomonas not sensitive to ciprofloxacin although only in rare titers 1/19; the patient's wound on the left anterior lower leg is just about healed. We have continued healing of the area that was medially on the left leg. Left first plantar metatarsal head continues to get smaller. The major problem here is his 2 drain sites 1 on the left upper calf and lateral thigh. There is purulent drainage still from the left lateral thigh. I gave him antibiotics last week but we still have recultured. He has the drain in the area I think this is eventually going to have to come out. I suspect there will be a connecting wound to heal here perhaps with improved VAc 1/26; the patient had his drain removed by vein and vascular on 1/25/. This was a large pocket of fluid in his left thigh that seem to tunnel into his left upper calf. 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 His mention his Penrose drain was removed today. He  now has a tunneling wound on his left calf and left thigh. Both of these probe widely towards each other although I cannot really prove that they connect. Both wounds on his lower leg anteriorly are closed and his area over the first metatarsal head on his right foot continues to improve. We are using Hydrofera Blue here. He also saw infectious disease culture of the abscess they noted was polymicrobial with MRSA, Morganella and Klebsiella he was treated with doxycycline and ciprofloxacin for 4 weeks ending on 07/03/2021. They did not recommend any further antibiotics. Notable that while he still had the Penrose drain in place last week he had purulent drainage coming out of the inferior IandD site this grew [facility], MRSA and Pseudomonas but there does not appear to be any active infection in this area today with the drain out and he is not systemically unwell 2/2; with regards to the drain sites the superior one on the thigh actually is closed down the one on the upper left lateral calf measures about 8 and half centimeters which is an improvement seems to be less prominent although still with a lot of drainage. The only remaining wound is over the first metatarsal head on the left foot and this looks to be continuing to improve with Hydrofera Blue. 2/9; the area on his plantar left foot continues to contract. Callus around the wound edge. The drain sites specifically have not come down in depth. We put the wound VAC on Monday he changed the canister late last night our intake nurse reported a pocket of fluid perhaps caused by our compression wraps 2/16; continued improvement in left foot plantar wound. drainage site in the calf is not improved in terms of depth (wound vac) 2/23; continued improvement in the left foot wound over the first metatarsal head. With regards to the drain sites the area on his  thigh laterally is healed however the open area on his calf is small in terms of circumference  by still probes in by about 15 cm. Within using the wound VAC. Hydrofera Blue on his foot 08/24/2021: The left first metatarsal head wound continues to improve. The wound bed is healthy with just some surrounding callus. Unfortunately the open drain site on his calf remains open and tunnels at least 15 cm (the extent of a Q-tip). This is despite several weeks of wound VAC treatment. Based on reading back through the notes, there has been really no significant change in the depth of the wound, although the orifice is smaller and the more cranial wound on his thigh has closed. I suspect the tunnel tracks nearly all the way to this location. 08/31/2021: Continued improvement in the left first metatarsal head wound. There has been absolutely no improvement to the long tunnel from his open drain site on his calf. We have tried to get him into see vascular surgery sooner to consider the possibility of simply filleting the tract open and allowing it to heal from the bottom up, likely with a wound VAC. They have not yet scheduled a sooner appointment than his current mid April 09/14/2021: He was seen by vascular surgery and they took him to the operating room last week. They opened a portion of the tunnel, but did not extend the entire length of the known open subcutaneous tract. I read Dr. Claretha Cooper operative note and it is not clear from that documentation why only a portion of the tract was opened. The heaped up granulation tissue was curetted and removed from at least some portion of the tract. They did place a wound VAC and applied an Unna boot to the leg. The ulcer on his left first metatarsal head is smaller today. The bed looks good and there is just a small amount of surrounding callus. 09/21/2021: The ulcer on his left first metatarsal head looks to be stalled. There is some callus surrounding the wound but the wound bed itself does not appear particularly dynamic. The tunnel tract on his lateral left leg  seems to be roughly the same length or perhaps slightly smaller but the wound bed appears healthy with good granulation tissue. He opened up a new wound on his medial thigh and the site of a prior surgical incision. He says that he did this unconsciously in his sleep by scratching. 09/28/2021: Unfortunately, the ulcer on his left first metatarsal head has extended underneath the callus toward the dorsum of his foot. The medial thigh wounds are roughly the same. The tunnel on his lateral left leg continues to be problematic; it is longer than we are able to actually probe with a Q-tip. I am still not certain as to why Dr. Donzetta Matters did not open this up entirely when he took the patient to the operating room. We will likely be back in the same situation with just a small superficial opening in a long unhealed tract, as the open portion is granulating in nicely. 10/02/2021: The patient was initially scheduled for a nurse visit, but we are also applying a total contact cast today. The plantar foot wound looks clean without significant accumulated callus. We have been applying Prisma silver collagen to the site. 10/05/2021: The patient is here for his first total contact cast change. We have tried using gauze packing strips in the tunnel on his lateral leg wound, but this does not seem to be working any better than the  white VAC foam. The foot ulcer looks about the same with minimal periwound callus. Medial thigh wound is clean with just some overlying eschar. 10/12/2021: The plantar foot wound is stable without any significant accumulation of periwound callus. The surface is viable with good granulation tissue. The medial thigh wounds are much smaller and are epithelializing. On the other hand, he had purulent drainage coming from the tunnel on his lateral leg. He does go back to see Dr. Donzetta Matters next week and is planning to ask him why the wound tunnel was not completely opened at the time of his most recent  operation. 10/19/2021: The plantar foot wound is markedly improved and has epithelial tissue coming through the surface. The medial thigh wounds are nearly closed with just a tiny open area. He did see Dr. Donzetta Matters earlier this week and apparently they did discuss the possibility of opening the sinus tract further and enabling a wound VAC application. Apparently there are some limits as to what Dr. 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 I think if we could get the tract open to the level of the popliteal fossa, this would greatly aid in her ability to get this chart closed. That being said, however, today when I probed the tract with a Q-tip, I was not able to insert the entirety of the Q-tip as I have on previous occasions. The tunnel is shorter by about 4 cm. The surface is clean with good granulation tissue and no further episodes of purulent drainage. 10/30/2021: Last week, the patient underwent surgery and had the long tract in his leg opened. There was a rind that was debrided, according to the operative report. His medial thigh ulcers are closed. The plantar foot wound is clean with a good surface and some built up surrounding callus. 11/06/2021: The overall dimensions of the large wound on his lateral leg remain about the same, but there is good granulation tissue present and the tunneling is a little bit shorter. He has a new wound on his anterior tibial surface, in the same location where he had a similar lesion in the past. The plantar foot wound is clean with some buildup surrounding callus. Just toward the medial aspect of his foot, however, there is an area of darkening that once debrided, revealed another opening in the skin surface. 11/13/2021: The anterior tibial surface wound is closed. The plantar foot wound has some surrounding callus buildup. The area of darkening that I debrided last week and revealed an opening in the skin surface has closed again.  The tunnel in the large wound on his lateral leg has come in by about 3 cm. There is healthy granulation tissue on the entire wound surface. 11/23/2021: The patient was out of town last week and did wet-to-dry dressings on his large wound. 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 Unfortunately, he picked open the wound on his left medial thigh. He says that it was itching and he just could not stop scratching it until it was open again. The wound on his plantar foot is smaller and has not accumulated a tremendous amount of callus. The lateral leg wound is shallower and the tunnel has also decreased in depth. There is just a little bit of slough accumulation on the surface. Electronic Signature(s) Signed: 11/23/2021 12:07:07 PM By: Fredirick Maudlin MD FACS Entered By: Fredirick Maudlin on 11/23/2021 12:07:07 -------------------------------------------------------------------------------- Physical Exam Details Patient Name:  Date of Service: JERMALL, ISAACSON 11/23/2021 10:45 A M Medical Record Number: [id number] Patient Account Number: [id number] Date of Birth/Sex: Treating RN: 07/21/1950 (71 y.o. [name] Primary Care Provider: [name] Other Clinician: Referring Provider: Treating Provider/Extender: [name] in Treatment: 32 Constitutional . . . . No acute distress. Respiratory Normal work of breathing on room air. Notes 11/23/2021: Unfortunately, he picked open the wound on his left medial thigh. It is fairly superficial but there is some slough accumulation on the surface. The wound on his plantar foot is smaller and has not accumulated a tremendous amount of callus. The lateral leg wound is shallower and the tunnel has also decreased in depth. There is just a little bit of slough accumulation on the surface. Electronic Signature(s) Signed: 11/23/2021 12:08:02 PM By: Fredirick Maudlin MD  FACS Entered By: Fredirick Maudlin on 11/23/2021 12:08:02 -------------------------------------------------------------------------------- Physician Orders Details Patient Name: Date of Service: [name]. 11/23/2021 10:45 A M Medical Record Number: [id number] Patient Account Number: [id number] Date of Birth/Sex: Treating RN: 08-01-1950 (71 y.o. [name] Primary Care Provider: [name] Other Clinician: Referring Provider: Treating Provider/Extender: [name] in Treatment: 51 Verbal / Phone Orders: No Diagnosis Coding ICD-10 Coding Code Description E11.621 Type 2 diabetes mellitus with foot ulcer 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 I89.0 Lymphedema, not elsewhere classified I87.322 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verity 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 - Dr Celine Ahr - Room 2 - Return A Nurse Visit: - mondays Bathing/ Shower/ Hygiene May shower with protection but do not get wound dressing(s) wet. - Use a cast protector so you can shower without getting your wrap(s) wet Negative Presssure Wound Therapy Wound #22 Left,Lateral Lower Leg Wound Vac to wound continuously at 167m/hg pressure - change 2 times per week in clinic Black Foam White Foam Edema Control - Lymphedema / SCD / Other Elevate legs to the level of the heart or above for 30 minutes daily and/or when sitting, a frequency of: - throughout the day Avoid standing for long periods of time. Patient to wear own compression stockings every day. - on right leg; Moisturize legs daily. - Ammonium lactate to right leg  daily Wound Treatment Wound #18 - Metatarsal head first Wound Laterality: Plantar, Left Cleanser: Soap and Water 2 x Per Week/30 Days Discharge Instructions: May shower and wash wound with dial antibacterial soap and water prior to dressing change. Cleanser: Wound Cleanser 2 x Per Week/30 Days Discharge Instructions: Cleanse the wound with wound cleanser prior to applying a clean dressing using gauze sponges, not tissue or cotton balls. Peri-Wound Care: Triamcinolone 15 (g) 2 x Per Week/30 Days Discharge Instructions: Use triamcinolone 15 (g) as directed Peri-Wound Care: Sween Lotion (Moisturizing lotion) 2 x Per Week/30 Days Discharge Instructions: Apply moisturizing lotion as directed Prim Dressing: Promogran Prisma Matrix, 4.34 (sq in) (silver collagen) 2 x Per Week/30 Days ary Discharge Instructions: Moisten collagen with saline or hydrogel Secondary Dressing: Woven Gauze Sponge, Non-Sterile 4x4 in 2 x Per Week/30 Days Discharge Instructions: Apply over primary dressing as directed. Secured With: 74M Medipore H Soft Cloth Surgical T ape, 4 x 10 (in/yd) 2 x Per Week/30 Days  Discharge Instructions: Secure with tape as directed. Compression Wrap: CoFlex TLC XL 2-layer Compression System 4x7 (in/yd) 2 x Per Week/30 Days Discharge Instructions: Apply CoFlex 2-layer compression as directed. (alt for 4 layer) Wound #22 - Lower Leg Wound Laterality: Left, Lateral Cleanser: Soap and Water 1 x Per Week/30 Days Discharge Instructions: May shower and wash wound with dial antibacterial soap and water prior to dressing change. Cleanser: Wound Cleanser 1 x Per Week/30 Days Discharge Instructions: Cleanse the wound with wound cleanser prior to applying a clean dressing using gauze sponges, not tissue or cotton balls. Prim Dressing: VAC ary 1 x Per Week/30 Days Wound #24R - Upper Leg Wound Laterality: Left, Medial Cleanser: Wound Cleanser 2 x Per Week/30 Days Discharge Instructions: Cleanse the wound  with wound cleanser prior to applying a clean dressing using gauze sponges, not tissue or cotton balls. Peri-Wound Care: Sween Lotion (Moisturizing lotion) 2 x Per Week/30 Days Discharge Instructions: Apply moisturizing lotion as directed Prim Dressing: KerraCel Ag Gelling Fiber Dressing, 2x2 in (silver alginate) 2 x Per Week/30 Days ary Discharge Instructions: Apply silver alginate to wound bed as instructed Secondary Dressing: Bordered Gauze, 4x4 in 2 x Per Week/30 Days Discharge Instructions: Apply over primary dressing as directed. Secured With: Elastic Bandage 4 inch (ACE bandage) 2 x Per Week/30 Days Discharge Instructions: Secure with ACE bandage as directed. Electronic Signature(s) Signed: 11/23/2021 12:38:17 PM By: Fredirick Maudlin MD FACS Entered By: Fredirick Maudlin on 11/23/2021 12:09:59 -------------------------------------------------------------------------------- Problem List Details Patient Name: Date of Service: [name]. 11/23/2021 10:45 A M Medical Record Number: [id number] Patient Account Number: [id number] Date of Birth/Sex: Treating RN: Jul 29, 1950 (71 y.o. [name] Primary Care Provider: [name] Other Clinician: Referring Provider: Treating Provider/Extender: [name] in Treatment: 37 Active Problems ICD-10 Encounter Code Description Active Date MDM Diagnosis E11.621 Type 2 diabetes mellitus with foot ulcer 04/12/2021 No Yes 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 04/12/2021 No Yes I89.0 Lymphedema, not elsewhere classified 04/12/2021 No Yes I87.322 Chronic venous hypertension (idiopathic) with inflammation of left lower 04/12/2021 No Yes extremity 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 ulcer of other part of left lower leg with other specified 04/12/2021 No Yes severity 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 of other part of left foot with other specified 04/12/2021 No  Yes severity 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 07/20/2021 No Yes Inactive Problems ICD-10 Code Description Active Date Inactive Date E11.42 Type 2 diabetes mellitus with diabetic polyneuropathy 04/12/2021 04/12/2021 L02.416 Cutaneous abscess of left lower limb 06/13/2021 06/13/2021 Resolved Problems Electronic Signature(s) Signed: 11/23/2021 12:00:11 PM By: Fredirick Maudlin MD FACS Entered By: Fredirick Maudlin on 11/23/2021 12:00:11 -------------------------------------------------------------------------------- Progress Note Details Patient Name: Date of Service: [name]. 11/23/2021 10:45 A M Medical Record Number: [id number] Patient Account Number: [id number] Date of Birth/Sex: Treating RN: [birth year] (71 y.o. [name] Primary Care Provider: [name] Other Clinician: Referring Provider: Treating Provider/Extender: [name] in Treatment: 32 Subjective Chief Complaint Information obtained from Patient Left leg and foot ulcers 04/12/2021; patient is here for wounds on his left lower leg and left plantar foot over the first metatarsal head History of Present Illness (HPI) 10/11/17; [name] is a 72 year old man who tells me that in 2015 he slipped down the latter traumatizing his left leg. He developed a wound in the same spot the area that we are currently looking at. He states this closed over for the most part  although he always felt it was somewhat unstable. In 2016 he hit the same area with the door of his car had this reopened. He tells me that this is never really closed although sometimes an inflow it remains open on a constant basis. He has not been using any specific dressing to this except for topical antibiotics the nature of which were not really sure. His primary doctor did send him to see Dr. [name] of interventional cardiology. He underwent an angiogram on 08/06/17 and he  underwent a PTA and directional atherectomy of the lesser distal SFA and popliteal arteries which resulted in brisk improvement in blood flow. It was noted that he had 2 vessel runoff through the anterior tibial and peroneal. He is also been to see vascular and interventional radiologist. He was not felt to have any significant superficial venous insufficiency. Presumably is not a candidate for any ablation. It was suggested he come here for wound care. The patient is a type II diabetic on insulin. He also has a history of venous insufficiency. ABIs on the left were noncompressible in our clinic 10/21/17; patient we admitted to the clinic last week. 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 We put Iodosorb on him after an aggressive debridement and 3 layer compression. He complained of pain in his ankle and itching with is skin in fact he scratched the area on the medial calf superiorly at the rim of our wraps and he has 2 small open areas in that location today which are new. I changed his primary dressing today to silver collagen. As noted he is already had revascularization and does not have any significant superficial venous insufficiency that would be amenable to ablation 10/28/17; patient admitted to the clinic 2 weeks ago. He has a smaller Wound. Scratch injury from last week revealed. There is large wound over the tibial area. This is smaller. Granulation looks healthy. No need for debridement. 11/04/17; the wound on the left lateral calf looks better. Improved dimensions. Surface of this looks better. We've been maintaining him and Kerlix Coban wraps. He finds this much more comfortable. Silver collagen dressing 11/11/17; left lateral Wound continues to look healthy be making progress. Using a #5 curet I removed removed nonviable skin from the surface of the wound and then necrotic debris from the wound surface. Surface of the  wound continues to look healthy. ooHe also has an open area on the left great toenail bed. We've been using topical antibiotics. 11/19/17; left anterior lateral wound continues to look healthy but it's not closed. ooHe also had a small wound above this on the left leg ooInitially traumatic wounds in the setting of significant chronic venous insufficiency and stasis dermatitis 11/25/17; left anterior wounds superiorly is closed still a small wound inferiorly. 12/02/17; left anterior tibial area. Arrives today with adherent callus. Post debridement clearly not completely closed. Hydrofera Blue under 3 layer compression. 12/09/17; left anterior tibia. Circumferential eschar however the wound bed looks stable to improved. We've been using Hydrofera Blue under 3 layer compression 12/17/17; left anterior tibia. Apparently this was felt to be closed however when the wrap was taken off there is a skin tear to reopen wounds in the same area we've been using Hydrofera Blue under 3 layer compression 12/23/17 left anterior tibia. Not close to close this week apparently the [facility] was stuck to this again. Still circumferential eschar requiring debridement. I put a contact layer  on this this time under the [facility] 12/31/17; left anterior tibia. Wound is better slight amount of hyper-granulation. Using Hydrofera Blue over Adaptic. 01/07/18; left anterior tibia. The wound had some surface eschar however after this was removed he has no open wound.he was already revascularized by Dr. [name] when he came to our clinic with atherectomy of the left SFA and popliteal artery. He was also sent to interventional radiology for venous reflux studies. He was not felt to have significant reflux but certainly has chronic venous changes of his skin with hemosiderin deposition around this area. He will definitely need to lubricate his skin and wear compression stocking and I've talked to him about  this. READMISSION 05/26/2018 This is a now 71 year old man we cared for with traumatic wounds on his left anterior lower extremity. He had been previously revascularized during that admission by Dr. [name]. Apparently in follow-up Dr. [name] noted that he had deterioration in his arterial status. He underwent a stent placement in the distal left SFA on 04/22/2018. 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 He went back to the angiography suite on 04/30/2018 he underwent PTA and balloon angioplasty of the occluded left mid anterior tibial artery, thrombotic occlusion went from 100 to 0% which reconstitutes the posterior tibial artery. He had thrombectomy and aspiration of the peroneal artery. The stent placed in the distal SFA left SFA was still occluded. 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 He has been recuperating at [facility] after the most recent hospitalization. He is going home tomorrow. He tells me that 2 weeks ago he traumatized the tip of his left fifth toe. He came in urgently for our review of this. This was a history of before I noted that Dr. [name] had already noted dry gangrenous changes of the left fifth toe 06/09/2018; 2-week follow-up. I did contact Dr. [name] after his last appointment and he apparently saw 1 of Dr. Irven Shelling colleagues the next day. He does not follow-up with Dr. [name] himself until Thursday of this week. 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 Nevertheless there is no evidence of infection no drainage and no pain. He had a new area that this week when we were signing him in today on the left anterior mid tibia area, this is in close proximity to the previous  wound we have dealt with in this clinic. 06/23/2018; 2-week follow-up. I did not receive a recent note from Dr. [name] to review today. Our office is trying to obtain this. He is apparently not planning to do further vascular interventions and wondered about compression to try and help with the patient's chronic venous insufficiency. However we are also concerned about the arterial flow. ooHe arrives in clinic today with a new area on the left third toe. The areas on the calf/anterior tibia are close to closing. The left fifth toe is still mummified using Betadine. -In reviewing things with the patient he has what sounds like claudication with mild to moderate amount of activity. 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 I prescribed Levaquin over the phone while we attempted to arrange a plan of care. However the patient  called yesterday to report he had low-grade fever and he came in today acutely. There is been a marked deterioration in the left third toe with spreading cellulitis up into the dorsal left foot. He was referred to the emergency room. Readmission: 06/29/2020 patient presents today for reevaluation here in our clinic he was previously treated by Dr. Dellia Nims at the latter part of 2019 in 2 the beginning of 2020. Subsequently we have not seen him since that time in the interim he did have evaluation with vein and vascular specialist specifically Dr. Anice Paganini who did perform quite extensive work for a left femoral to anterior tibial artery bypass. With that being said in the interim the patient has developed significant lymphedema and has wounds that he tells me have really never healed in regard to the incision site on the left leg. He also has multiple wounds on the feet for various reasons some of which is that he tends to pick at his feet. Fortunately there is no signs of active infection systemically at this time he does have some wounds that are  little bit deeper but most are fairly superficial he seems to have good blood flow and overall everything appears to be healthy I see no bone exposed and no obvious signs of osteomyelitis. I do not know that he necessarily needs a x-ray at this point although that something we could consider depending on how things progress. The patient does have a history of lymphedema, diabetes, this is type II, chronic kidney disease stage III, hypertension, and history of peripheral vascular disease. 07/05/2020; patient admitted last week. Is a patient I remember from 2019 he had a spreading infection involving the left foot and we sent him to the hospital. 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 He subsequently had a left femoral to anterior tibial bypass by Dr.[facility] vein and vascular. He also has severe lymphedema with chronic skin changes related to that on the left leg. The most problematic area that was new today was on the left medial great toe. This was apparently a small area last week there was purulent drainage which our intake nurse cultured. Also areas on the left medial foot and heel left lateral foot. He has 2 areas on the left medial calf left lateral calf in the setting of the severe lymphedema. 07/13/2020 on evaluation today patient appears to be doing better in my opinion compared to his last visit. The good news is there is no signs of active infection systemically and locally I do not see any signs of infection either. He did have an x-ray which was negative that is great news he had a culture which showed MRSA but at the same time he is been on the doxycycline which has helped. I do think we may want to extend this for 7 additional days 1/25; patient admitted to the clinic a few weeks ago. 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 We have been putting him under compression his edema control is a lot better but  he is severe verricused skin on the left leg. He is really done quite well he still has an open area on the left medial calf and the left medial first metatarsal head. We have been using silver collagen on the leg silver alginate on the foot 07/27/2020 upon evaluation today patient appears to be doing decently well in regard to his wounds. He still  has a lot of dry skin on the left leg. Some of this is starting to peel back and I think he may be able to have them out by removing some that today. Fortunately there is no signs of active infection at this time on the left leg although on the right leg he does appear to have swelling and erythema as well as some mild warmth to touch. This does have been concerned about the possibility of cellulitis although within the differential diagnosis I do think that potentially a DVT has to be at least considered. We need to rule that out before proceeding would just call in the cellulitis. Especially since he is having pain in the posterior aspect of his calf muscle. 2/8; the patient had seen sparingly. He has severe skin changes of chronic lymphedema in the left leg thickened hyperkeratotic verrucous skin. He has an open wound on the medial part of the left first met head left mid tibia. He also has a rim of nonepithelialized skin in the anterior mid tibia. He brought in the AmLactin lotion that was been prescribed although I am not sure under compression and its utility. There concern about cellulitis on the right lower leg the last time he was here. He was put on on antibiotics. His DVT rule out was negative. The right leg looks fine he is using his stocking on this area 08/10/2020 upon evaluation today patient appears to be doing well with regard to his leg currently. He has been tolerating the dressing changes without complication. Fortunately there is no signs of active infection which is great news. Overall very pleased with where things stand. 2/22; the  patient still has an area on the medial part of the left first met his head. This looks better than when I last saw this earlier this month he has a rim of epithelialization but still some surface debris. Mostly everything on the left leg is healed. There is still a vulnerable in the left mid tibia area. 08/30/2020 upon evaluation today patient appears to be doing much better in regard to his wounds on his foot. 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 Nonetheless I think we will address that today I Minna send in a prescription for him in that regard. Overall though there does not appear to be any signs of significant worsening. 09/07/2020 on evaluation today patient's wounds over his left foot appear to be doing excellent. I do not see any signs of infection there is some callus buildup this can require debridement for certain but overall I feel like he is managing quite nicely. He still using the AmLactin cream which has been beneficial for him as well. 3/22; left foot wound is closed. There is no open area here. He is using ammonium lactate lotion to the lower extremities to help exfoliate dry cracked skin. He has compression stockings from elastic therapy in [place]. The wound on the medial part of his left first met head is healed today. READMISSION 04/12/2021 [name] is a patient we know fairly well he had a prolonged stay in clinic in 2019 with wounds on his left lateral and left anterior lower extremity in the setting of chronic venous insufficiency. More recently he was here earlier this year with predominantly an area on his left foot first metatarsal head plantar and he says the plantar foot  broke down on its not long after we discharged him but he did not come back here. The last few months areas of broken down on his left anterior and again the left  lateral lower extremity. The leg itself is very swollen chronically enlarged a lot of hyperkeratotic dry Berry Q skin in the left lower leg. His edema extends well into the thigh. He was seen by Dr. Donzetta Matters. He had ABIs on 03/02/2021 showing an ABI on the right of 1 with a TBI of 0.72 his ABI in the left at 1.09 TBI of 0.99. Monophasic and biphasic waveforms on the right. On the left monophasic waveforms were noted he went on to have an angiogram on 03/27/2021 this showed the aortic aortic and iliac segments were free of flow-limiting stenosis the left common femoral vein to evaluate the left femoral to anterior tibial artery bypass was unobstructed the bypass was patent without any areas of stenosis. We discharged the patient in bilateral juxta lite stockings but very clearly that was not sufficient to control the swelling and maintain skin integrity. He is clearly going to need compression pumps. 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 This is fortunate because he is on his feet for long periods of time. 10/27; patient comes in with our intake nurse reporting copious amount of green drainage from the left anterior mid tibia the left dorsal foot and to a lesser extent the left medial mid tibia. We left the compression wrap on all week for the amount of edema in his left leg is quite a bit better. We use silver alginate as the primary dressing 11/3; edema control is good. Left anterior lower leg left medial lower leg and the plantar first metatarsal head. The left anterior lower leg required debridement. Deep tissue culture I did of this wound showed MRSA I put him on 10 days of doxycycline which she will start today. We have him in compression wraps. He has a security card and [employer] however he is retiring on November 15. We will need to then get him into a better offloading boot for the left foot perhaps a total contact cast 11/10; edema control is  quite good. Left anterior and left medial lower leg wounds in the setting of chronic venous insufficiency and lymphedema. He also has a substantial area over the left plantar first metatarsal head. 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 He has significant hypergranulation on the left plantar foot wound. The patient is a diabetic but he does not have significant PAD 11/17; edema control is quite good. Left anterior and left medial lower leg wounds look better. The really concerning area remains the area on the left plantar first metatarsal head. He has a rim of epithelialization. 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 Starting today with a forefoot off loader but . possibly a total contact cast. 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 Arrives with a very significant odor from I think all of the wounds on the left leg and the left foot. Because of this  I did not put a total contact cast on him today but will could still consider this. His wife was having cataract surgery which is the reason he missed the appointment 12/6. I saw this man 5 days ago with a swelling below the popliteal fossa. I thought he actually might have a Baker's cyst however the DVT rule out study that we could arrange right away was negative the technician told me this was not a ruptured Baker's cyst. We attempted to get this aspirated by under ultrasound guidance in interventional radiology however all they did was an ultrasound however it shows an extensive fluid collection 62 x 8 x 9.4 in the left thigh and left calf. The patient states he thinks this started 8 days ago or so but he really is not complaining  of any pain, fever or systemic symptoms. He has not ha 12/20; after some difficulty I managed to get the patient into see Dr. Donzetta Matters. Eventually he was taken into the hospital and had a drain put in the fluid collection below his left knee posteriorly extending into the posterior thigh. He still has the drain in place. Culture of this showed moderate staff aureus few Morganella and few Klebsiella he is now on doxycycline and ciprofloxacin as suggested by infectious disease he is on this for a month. The drain will remain in place until it stops draining 12/29; he comes in today with the 1 wound on his left leg and the area on the left plantar first met head significantly smaller. Both look healthy. He still has the drain in the left leg. He says he has to change this daily. Follows up with Dr. Donzetta Matters on January 11. 06/29/2021; the wounds that I am following on the left leg and left first met head continued to be quite healthy. However the area where his inferior drain is in place had copious amounts of drainage which was green in color. The wound here is larger. Follows up with Dr. Gwenlyn Saran of vein and vascular his surgeon next week as well as infectious disease. He remains on ciprofloxacin and doxycycline. He is not complaining of excessive pain in either one of the drain areas 1/12; the patient saw vascular surgery and infectious disease. Vascular surgery has left the drain in place as there was still some notable drainage still see him back in 2 weeks. Dr. Velna Ochs stop the doxycycline and ciprofloxacin and I do not believe he follows up with them at this point. Culture I did last week showed both doxycycline resistant MRSA and Pseudomonas not sensitive to ciprofloxacin although only in rare titers 1/19; the patient's wound on the left anterior lower leg is just about healed. We have continued healing of the area that was medially on the left leg. Left first plantar metatarsal head continues to get  smaller. The major problem here is his 2 drain sites 1 on the left upper calf and lateral thigh. There is purulent drainage still from the left lateral thigh. I gave him antibiotics last week but we still have recultured. He has the drain in the area I think this is eventually going to have to come out. I suspect there will be a connecting wound to heal here perhaps with improved VAc 1/26; the patient had his drain removed by vein and vascular on 1/25/. This was a large pocket of fluid in his left thigh that seem to tunnel into his left upper calf. 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  His mention his Penrose drain was removed today. He now has a tunneling wound on his left calf and left thigh. Both of these probe widely towards each other although I cannot really prove that they connect. Both wounds on his lower leg anteriorly are closed and his area over the first metatarsal head on his right foot continues to improve. We are using Hydrofera Blue here. He also saw infectious disease culture of the abscess they noted was polymicrobial with MRSA, Morganella and Klebsiella he was treated with doxycycline and ciprofloxacin for 4 weeks ending on 07/03/2021. They did not recommend any further antibiotics. Notable that while he still had the Penrose drain in place last week he had purulent drainage coming out of the inferior IandD site this grew [facility], MRSA and Pseudomonas but there does not appear to be any active infection in this area today with the drain out and he is not systemically unwell 2/2; with regards to the drain sites the superior one on the thigh actually is closed down the one on the upper left lateral calf measures about 8 and half centimeters which is an improvement seems to be less prominent although still with a lot of drainage. The only remaining wound is over the first metatarsal head on the left foot and this looks to be continuing to improve with  Hydrofera Blue. 2/9; the area on his plantar left foot continues to contract. Callus around the wound edge. The drain sites specifically have not come down in depth. We put the wound VAC on Monday he changed the canister late last night our intake nurse reported a pocket of fluid perhaps caused by our compression wraps 2/16; continued improvement in left foot plantar wound. drainage site in the calf is not improved in terms of depth (wound vac) 2/23; continued improvement in the left foot wound over the first metatarsal head. With regards to the drain sites the area on his thigh laterally is healed however the open area on his calf is small in terms of circumference by still probes in by about 15 cm. Within using the wound VAC. Hydrofera Blue on his foot 08/24/2021: The left first metatarsal head wound continues to improve. The wound bed is healthy with just some surrounding callus. Unfortunately the open drain site on his calf remains open and tunnels at least 15 cm (the extent of a Q-tip). This is despite several weeks of wound VAC treatment. Based on reading back through the notes, there has been really no significant change in the depth of the wound, although the orifice is smaller and the more cranial wound on his thigh has closed. I suspect the tunnel tracks nearly all the way to this location. 08/31/2021: Continued improvement in the left first metatarsal head wound. There has been absolutely no improvement to the long tunnel from his open drain site on his calf. We have tried to get him into see vascular surgery sooner to consider the possibility of simply filleting the tract open and allowing it to heal from the bottom up, likely with a wound VAC. They have not yet scheduled a sooner appointment than his current mid April 09/14/2021: He was seen by vascular surgery and they took him to the operating room last week. They opened a portion of the tunnel, but did not extend the entire length of the  known open subcutaneous tract. I read Dr. Claretha Cooper operative note and it is not clear from that documentation why only a portion of the tract was  opened. The heaped up granulation tissue was curetted and removed from at least some portion of the tract. They did place a wound VAC and applied an Unna boot to the leg. The ulcer on his left first metatarsal head is smaller today. The bed looks good and there is just a small amount of surrounding callus. 09/21/2021: The ulcer on his left first metatarsal head looks to be stalled. There is some callus surrounding the wound but the wound bed itself does not appear particularly dynamic. The tunnel tract on his lateral left leg seems to be roughly the same length or perhaps slightly smaller but the wound bed appears healthy with good granulation tissue. He opened up a new wound on his medial thigh and the site of a prior surgical incision. He says that he did this unconsciously in his sleep by scratching. 09/28/2021: Unfortunately, the ulcer on his left first metatarsal head has extended underneath the callus toward the dorsum of his foot. The medial thigh wounds are roughly the same. The tunnel on his lateral left leg continues to be problematic; it is longer than we are able to actually probe with a Q-tip. I am still not certain as to why Dr. Donzetta Matters did not open this up entirely when he took the patient to the operating room. We will likely be back in the same situation with just a small superficial opening in a long unhealed tract, as the open portion is granulating in nicely. 10/02/2021: The patient was initially scheduled for a nurse visit, but we are also applying a total contact cast today. The plantar foot wound looks clean without significant accumulated callus. We have been applying Prisma silver collagen to the site. 10/05/2021: The patient is here for his first total contact cast change. We have tried using gauze packing strips in the tunnel on his lateral  leg wound, but this does not seem to be working any better than the white VAC foam. The foot ulcer looks about the same with minimal periwound callus. Medial thigh wound is clean with just some overlying eschar. 10/12/2021: The plantar foot wound is stable without any significant accumulation of periwound callus. The surface is viable with good granulation tissue. The medial thigh wounds are much smaller and are epithelializing. On the other hand, he had purulent drainage coming from the tunnel on his lateral leg. He does go back to see Dr. Donzetta Matters next week and is planning to ask him why the wound tunnel was not completely opened at the time of his most recent operation. 10/19/2021: The plantar foot wound is markedly improved and has epithelial tissue coming through the surface. The medial thigh wounds are nearly closed with just a tiny open area. He did see Dr. Donzetta Matters earlier this week and apparently they did discuss the possibility of opening the sinus tract further and enabling a wound VAC application. Apparently there are some limits as to what Dr. 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 I think if we could get the tract open to the level of the popliteal fossa, this would greatly aid in her ability to get this chart closed. That being said, however, today when I probed the tract with a Q-tip, I was not able to insert the entirety of the Q-tip as I have on previous occasions. The tunnel is shorter by about 4 cm. The surface is clean with good granulation tissue and no further episodes of purulent drainage. 10/30/2021: Last week, the patient underwent surgery  and had the long tract in his leg opened. There was a rind that was debrided, according to the operative report. His medial thigh ulcers are closed. The plantar foot wound is clean with a good surface and some built up surrounding callus. 11/06/2021: The overall dimensions of the large wound on his lateral leg  remain about the same, but there is good granulation tissue present and the tunneling is a little bit shorter. He has a new wound on his anterior tibial surface, in the same location where he had a similar lesion in the past. The plantar foot wound is clean with some buildup surrounding callus. Just toward the medial aspect of his foot, however, there is an area of darkening that once debrided, revealed another opening in the skin surface. 11/13/2021: The anterior tibial surface wound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

## 2021-11-27 ENCOUNTER — Encounter (HOSPITAL_BASED_OUTPATIENT_CLINIC_OR_DEPARTMENT_OTHER): Payer: Medicare Other | Admitting: General Surgery

## 2021-11-27 DIAGNOSIS — E11621 Type 2 diabetes mellitus with foot ulcer: Secondary | ICD-10-CM | POA: Diagnosis not present

## 2021-11-27 NOTE — Progress Notes (Signed)
[place], CLOWERS ([id number]) Visit Report for 11/27/2021 Arrival Information Details Patient Name: Date of Service: [name], [name] 11/27/2021 11:15 A M Medical Record Number: [id number] Patient Account Number: [id number] Date of Birth/Sex: Treating RN: December 03, 1950 (71 y.o. [name] Primary Care [place] Derusha: [name] Other Clinician: Referring Devaeh Amadi: Treating Natisha Trzcinski/Extender: [name] in Treatment: 63 Visit Information History Since Last Visit Added or deleted any medications: No Patient Arrived: Ambulatory Any new allergies or adverse reactions: No Arrival Time: 11:14 Had a fall or experienced change in No Accompanied By: self activities of daily living that may affect Transfer Assistance: None risk of falls: Patient Identification Verified: Yes Signs or symptoms of abuse/neglect since No Secondary Verification Process Completed: Yes last visito Patient Requires Transmission-Based Precautions: No Hospitalized since last visit: No Patient Has Alerts: Yes Implantable device outside of the clinic No excluding cellular tissue based products placed in the center since last visit: Has Dressing in Place as Prescribed: Yes Has Compression in Place as Prescribed: Yes Has Footwear/Offloading in Place as Yes Prescribed: Left: Surgical Shoe with Pressure Relief Insole Pain Present Now: No Electronic Signature(s) Signed: 11/27/2021 5:13:49 PM By: Baruch Gouty RN, BSN Entered By: Baruch Gouty on 11/27/2021 11:14:56 -------------------------------------------------------------------------------- Compression Therapy Details Patient Name: Date of Service: [name]. 11/27/2021 11:15 A M Medical Record Number: [id number] Patient Account Number: [id number] Date of Birth/Sex: Treating RN: May 01, 1951 (71 y.o. [name] Primary Care Kenesha Moshier: [name] Other Clinician: Referring Aryianna Earwood: Treating Shaguana Love/Extender: [name] in Treatment: 32 Compression Therapy Performed for Wound Assessment: Wound #22 Left,Lateral Lower Leg Performed By: Clinician Baruch Gouty, RN Compression Type: Double Layer Electronic Signature(s) Signed: 11/27/2021 5:13:49 PM By: Baruch Gouty RN, BSN Entered By: Baruch Gouty on 11/27/2021 12:07:27 -------------------------------------------------------------------------------- Encounter Discharge Information Details Patient Name: Date of Service: [name]. 11/27/2021 11:15 A M Medical Record Number: [id number] Patient Account Number: [id number] Date of Birth/Sex: Treating RN: 1951/05/09 (71 y.o. [name] Primary Care Lyndia Bury: [name] Other Clinician: Referring Keaghan Staton: Treating Lexxie Winberg/Extender: [name] in Treatment: 32 Encounter Discharge Information Items Discharge Condition: Stable Ambulatory Status: Ambulatory Discharge Destination: Home Transportation: Other Accompanied By: self Schedule Follow-up Appointment: Yes Clinical Summary of Care: Patient Declined Electronic Signature(s) Signed: 11/27/2021 5:13:49 PM By: Baruch Gouty RN, BSN Entered By: Baruch Gouty on 11/27/2021 12:08:21 -------------------------------------------------------------------------------- Negative Pressure Wound Therapy Maintenance (NPWT) Details Patient Name: Date of Service: [name], [name] 11/27/2021 11:15 A M Medical Record Number: [id number] Patient Account Number: [id number] Date of Birth/Sex: Treating RN: 01-16-1951 (71 y.o. [name] Primary Care Heitor Steinhoff: [name] Other Clinician: Referring Kamarion Zagami: Treating Caitlain Tweed/Extender: [name] in Treatment: 32 NPWT Maintenance Performed for: Wound #22 Left, Lateral Lower Leg Additional Injuries Covered: No Performed By: Baruch Gouty, RN Type: VAC System Coverage Size (sq cm): 38.36 Pressure Type:  Constant Pressure Setting: 125 mmHG Drain Type: None Sponge/Dressing Type: Combination : white and black foam Date Initiated: 07/27/2021 Dressing Removed: No Quantity of Sponges/Gauze Removed: 2 Canister Changed: No Dressing Reapplied: No Quantity of Sponges/Gauze Inserted: 2 Respones T Treatment: o good Days On NPWT : 124 Electronic Signature(s) Signed: 11/27/2021 5:13:49 PM By: Baruch Gouty RN, BSN Entered By: Baruch Gouty on 11/27/2021 12:05:34 -------------------------------------------------------------------------------- Patient/Caregiver Education Details Patient Name: Date of Service: [name] 6/5/2023andnbsp11:15 A M Medical Record Number: [id number] Patient Account Number: [id number] Date of Birth/Gender: Treating RN: 01-17-1951 (71 y.o. [name] Primary  Care Physician: [name] Other Clinician: Referring Physician: Treating Physician/Extender: [name] in Treatment: 52 Education Assessment Education Provided To: Patient Education Topics Provided Offloading: Methods: Explain/Verbal Responses: Reinforcements needed, State content correctly Venous: Methods: Explain/Verbal Responses: Reinforcements needed, State content correctly Electronic Signature(s) Signed: 11/27/2021 5:13:49 PM By: Baruch Gouty RN, BSN Entered By: Baruch Gouty on 11/27/2021 12:08:05 -------------------------------------------------------------------------------- Wound Assessment Details Patient Name: Date of Service: [name]. 11/27/2021 11:15 A M Medical Record Number: [id number] Patient Account Number: [id number] Date of Birth/Sex: Treating RN: 31-May-1951 (71 y.o. [name] Primary Care Kano Heckmann: [name] Other Clinician: Referring Ticara Waner: Treating Leevi Cullars/Extender: [name] in Treatment: 32 Wound Status Wound Number: 18 Primary Etiology: Diabetic Wound/Ulcer of the Lower  Extremity Wound Location: Left, Plantar Metatarsal head first Wound Status: Open Wounding Event: Gradually Appeared Date Acquired: 08/23/2020 Weeks Of Treatment: 32 Clustered Wound: No Wound Measurements Length: (cm) 0.8 Width: (cm) 0.7 Depth: (cm) 0.1 Area: (cm) 0.44 Volume: (cm) 0.044 % Reduction in Area: 96.9% % Reduction in Volume: 98.4% Wound Description Classification: Grade 2 Exudate Amount: Medium Exudate Type: Serosanguineous Exudate Color: red, brown Treatment Notes Wound #18 (Metatarsal head first) Wound Laterality: Plantar, Left Cleanser Soap and Water Discharge Instruction: May shower and wash wound with dial antibacterial soap and water prior to dressing change. Wound Cleanser Discharge Instruction: Cleanse the wound with wound cleanser prior to applying a clean dressing using gauze sponges, not tissue or cotton balls. Peri-Wound Care Triamcinolone 15 (g) Discharge Instruction: Use triamcinolone 15 (g) as directed Sween Lotion (Moisturizing lotion) Discharge Instruction: Apply moisturizing lotion as directed Topical Primary Dressing Promogran Prisma Matrix, 4.34 (sq in) (silver collagen) Discharge Instruction: Moisten collagen with saline or hydrogel Secondary Dressing Woven Gauze Sponge, Non-Sterile 4x4 in Discharge Instruction: Apply over primary dressing as directed. Secured With 6M Medipore H Soft Cloth Surgical T ape, 4 x 10 (in/yd) Discharge Instruction: Secure with tape as directed. Compression Wrap CoFlex TLC XL 2-layer Compression System 4x7 (in/yd) Discharge Instruction: Apply CoFlex 2-layer compression as directed. (alt for 4 layer) Compression Stockings Add-Ons Electronic Signature(s) Signed: 11/27/2021 5:13:49 PM By: Baruch Gouty RN, BSN Entered By: Baruch Gouty on 11/27/2021 12:04:48 -------------------------------------------------------------------------------- Wound Assessment Details Patient Name: Date of Service: [name]. 11/27/2021 11:15 A M Medical Record Number: [id number] Patient Account Number: [id number] Date of Birth/Sex: Treating RN: 11/28/1950 (71 y.o. [name] Primary Care Jed Kutch: [name] Other Clinician: Referring Zaid Tomes: Treating Sallye Lunz/Extender: [name] in Treatment: 32 Wound Status Wound Number: 22 Primary Etiology: Cyst Wound Location: Left, Lateral Lower Leg Wound Status: Open Wounding Event: Bump Date Acquired: 06/03/2021 Weeks Of Treatment: 25 Clustered Wound: No Wound Measurements Length: (cm) 13.7 Width: (cm) 2.8 Depth: (cm) 1 Area: (cm) 30.128 Volume: (cm) 30.128 % Reduction in Area: -1727% % Reduction in Volume: -2184.2% Wound Description Classification: Full Thickness With Exposed Support Structure Exudate Amount: Large Exudate Type: Serosanguineous Exudate Color: red, brown s Treatment Notes Wound #22 (Lower Leg) Wound Laterality: Left, Lateral Cleanser Soap and Water Discharge Instruction: May shower and wash wound with dial antibacterial soap and water prior to dressing change. Wound Cleanser Discharge Instruction: Cleanse the wound with wound cleanser prior to applying a clean dressing using gauze sponges, not tissue or cotton balls. Peri-Wound Care Topical Primary Dressing VAC Secondary Dressing Secured With Compression Wrap Compression Stockings Add-Ons Electronic Signature(s) Signed: 11/27/2021 5:13:49 PM By: Baruch Gouty RN, BSN Entered By: Baruch Gouty on 11/27/2021 12:04:48 -------------------------------------------------------------------------------- Wound  Assessment Details Patient Name: Date of Service: [name], [name] 11/27/2021 11:15 A M Medical Record Number: [id number] Patient Account Number: [id number] Date of Birth/Sex: Treating RN: 05-04-1951 (71 y.o. [name] Primary Care Riggs Dineen: [name] Other Clinician: Referring Dekisha Mesmer: Treating Kore Madlock/Extender:  [name] in Treatment: 32 Wound Status Wound Number: 24R Primary Etiology: Abrasion Wound Location: Left, Medial Upper Leg Wound Status: Open Wounding Event: Trauma Date Acquired: 09/14/2021 Weeks Of Treatment: 9 Clustered Wound: No Wound Measurements Length: (cm) 3.2 Width: (cm) 1.3 Depth: (cm) 0.1 Area: (cm) 3.267 Volume: (cm) 0.327 % Reduction in Area: -98.1% % Reduction in Volume: -98.2% Wound Description Classification: Full Thickness Without Exposed Support Structu Exudate Amount: Medium Exudate Type: Serosanguineous Exudate Color: red, brown res Treatment Notes Wound #24R (Upper Leg) Wound Laterality: Left, Medial Cleanser Wound Cleanser Discharge Instruction: Cleanse the wound with wound cleanser prior to applying a clean dressing using gauze sponges, not tissue or cotton balls. Peri-Wound Care Sween Lotion (Moisturizing lotion) Discharge Instruction: Apply moisturizing lotion as directed Topical Primary Dressing KerraCel Ag Gelling Fiber Dressing, 2x2 in (silver alginate) Discharge Instruction: Apply silver alginate to wound bed as instructed Secondary Dressing Bordered Gauze, 4x4 in Discharge Instruction: Apply over primary dressing as directed. Secured With Elastic Bandage 4 inch (ACE bandage) Discharge Instruction: Secure with ACE bandage as directed. Compression Wrap Compression Stockings Add-Ons Electronic Signature(s) Signed: 11/27/2021 5:13:49 PM By: Baruch Gouty RN, BSN Entered By: Baruch Gouty on 11/27/2021 12:04:48 -------------------------------------------------------------------------------- Vitals Details Patient Name: Date of Service: [name]. 11/27/2021 11:15 A M Medical Record Number: [id number] Patient Account Number: [id number] Date of Birth/Sex: Treating RN: 17-Apr-1951 (71 y.o. [name] Primary Care Santoria Chason: [name] Other Clinician: Referring Jamontae Thwaites: Treating  Laurelin Elson/Extender: [name] in Treatment: 32 Vital Signs Time Taken: 11:20 Temperature (F): 98 Height (in): 74 Pulse (bpm): 67 Weight (lbs): 238 Respiratory Rate (breaths/min): 20 Body Mass Index (BMI): 30.6 Blood Pressure (mmHg): 163/84 Capillary Blood Glucose (mg/dl): 231 Reference Range: 80 - 120 mg / dl Notes glucose per pt report this am Electronic Signature(s) Signed: 11/27/2021 5:13:49 PM By: Baruch Gouty RN, BSN Entered By: Baruch Gouty on 11/27/2021 11:20:47

## 2021-11-27 NOTE — Progress Notes (Signed)
[name], [name] ([id number]) Visit Report for 11/27/2021 SuperBill Details Patient Name: Date of Service: [name], [name] 11/27/2021 Medical Record Number: [id number] Patient Account Number: [id number] Date of Birth/Sex: Treating RN: [birth year] (71 y.o. 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

## 2021-11-30 ENCOUNTER — Encounter (HOSPITAL_BASED_OUTPATIENT_CLINIC_OR_DEPARTMENT_OTHER): Payer: Medicare Other | Admitting: General Surgery

## 2021-11-30 DIAGNOSIS — E11621 Type 2 diabetes mellitus with foot ulcer: Secondary | ICD-10-CM | POA: Diagnosis not present

## 2021-11-30 NOTE — Progress Notes (Signed)
KHAYDEN, HERZBERG ([id number]) Visit Report for 11/30/2021 Arrival Information Details Patient Name: Date of Service: [name], PIENTA 11/30/2021 10:00 A M Medical Record Number: [id number] Patient Account Number: [id number] Date of Birth/Sex: Treating RN: Jun 17, 1951 (71 y.o. [name] Primary Care Carolan Avedisian: [name] Other Clinician: Referring Nevaeha Finerty: Treating Syed Zukas/Extender: [name] in Treatment: 85 Visit Information History Since Last Visit Added or deleted any medications: No Patient Arrived: Ambulatory Any new allergies or adverse reactions: No Arrival Time: 09:59 Had a fall or experienced change in No Accompanied By: self activities of daily living that may affect Transfer Assistance: None risk of falls: Patient Identification Verified: Yes Signs or symptoms of abuse/neglect since last visito No Secondary Verification Process Completed: Yes Hospitalized since last visit: No Patient Requires Transmission-Based Precautions: No Implantable device outside of the clinic excluding No Patient Has Alerts: Yes cellular tissue based products placed in the center since last visit: Has Dressing in Place as Prescribed: Yes Pain Present Now: Yes Electronic Signature(s) Signed: 11/30/2021 4:29:02 PM By: Adline Peals Entered By: Adline Peals on 11/30/2021 10:00:04 -------------------------------------------------------------------------------- Compression Therapy Details Patient Name: Date of Service: [name] [name]. 11/30/2021 10:00 A M Medical Record Number: [id number] Patient Account Number: [id number] Date of Birth/Sex: Treating RN: 12-19-1950 (71 y.o. [name] Primary Care [name]: [name] Other Clinician: Referring [name]: Treating [name]/Extender: [name] in Treatment: 33 Compression Therapy Performed for Wound Assessment: Wound #22 Left,Lateral Lower Leg Performed By:  Clinician Adline Peals, RN Compression Type: Double Layer Post Procedure Diagnosis Same as Pre-procedure Electronic Signature(s) Signed: 11/30/2021 4:29:02 PM By: Adline Peals Entered By: Adline Peals on 11/30/2021 10:30:09 -------------------------------------------------------------------------------- Encounter Discharge Information Details Patient Name: Date of Service: [name] [name]. 11/30/2021 10:00 A M Medical Record Number: [id number] Patient Account Number: [id number] Date of Birth/Sex: Treating RN: [birth year] (71 y.o. [name] Primary Care [name]: [name] Other Clinician: Referring [name]: Treating [name]/Extender: [name] in Treatment: 33 Encounter Discharge Information Items Post Procedure Vitals Discharge Condition: Stable Temperature (F): 98.6 Ambulatory Status: Ambulatory Pulse (bpm): 71 Discharge Destination: Home Respiratory Rate (breaths/min): 20 Transportation: Private Auto Blood Pressure (mmHg): 138/77 Accompanied By: self Schedule Follow-up Appointment: Yes Clinical Summary of Care: Patient Declined Electronic Signature(s) Signed: 11/30/2021 4:29:02 PM By: Adline Peals Entered By: Adline Peals on 11/30/2021 11:09:25 -------------------------------------------------------------------------------- Lower Extremity Assessment Details Patient Name: Date of Service: [name] [name]. 11/30/2021 10:00 A M Medical Record Number: [id number] Patient Account Number: [id number] Date of Birth/Sex: Treating RN: 05/28/1951 (71 y.o. [name] Primary Care Del Overfelt: [name] Other Clinician: Referring [name]: Treating [name]/Extender: [name] in Treatment: 33 Edema Assessment Assessed: [name]: No] [name]: No] Edema: [Left: Ye] [Right: s] Calf Left: Right: Point of Measurement: 41 cm From Medial Instep 43 cm Ankle Left: Right: Point of  Measurement: 10 cm From Medial Instep 27.6 cm Vascular Assessment Pulses: Dorsalis Pedis Palpable: [Left:Yes] Electronic Signature(s) Signed: 11/30/2021 4:29:02 PM By: Adline Peals Entered By: Adline Peals on 11/30/2021 10:16:59 -------------------------------------------------------------------------------- Multi Wound Chart Details Patient Name: Date of Service: [name] [name]. 11/30/2021 10:00 A M Medical Record Number: [id number] Patient Account Number: [id number] Date of Birth/Sex: Treating RN: [birth year] (71 y.o. 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 Primary Etiology: Extremity Glaucoma, Sleep Apnea, Glaucoma, Sleep Apnea, Glaucoma, Sleep Apnea, Comorbid History: Hypertension, Peripheral Arterial Hypertension, Peripheral Arterial Hypertension, Peripheral Arterial Disease, Peripheral Venous Disease, Disease, Peripheral Venous Disease, Disease, Peripheral Venous Disease, Type II Diabetes, Gout, Osteoarthritis, Type II Diabetes, Gout, Osteoarthritis, Type II Diabetes, Gout, Osteoarthritis, Neuropathy Neuropathy Neuropathy 08/23/2020 06/03/2021 09/14/2021 Date Acquired: 33 25 10 Weeks of Treatment: Open Open Open Wound Status: No No Yes Wound Recurrence: No No Yes Clustered Wound: N/A N/A 3 Clustered Quantity: 0.8x1x0.1 13.5x3x0.1 8.5x0.6x0.1 Measurements L x W x D (cm) 0.628 31.809 4.006 A (cm) : rea 0.063  3.181 0.401 Volume (cm) : 95.60% -1829.00% -142.90% % Reduction in A rea: 97.80% -141.20% -143.00% % Reduction in Volume: 3 Position 1 (o'clock): 3.4 Maximum Distance 1 (cm): No Yes No Tunneling: Grade 2 Full Thickness With Exposed Support Full Thickness Without Exposed Classification: Structures Support Structures Medium Large Medium Exudate A mount: Serosanguineous Serosanguineous Serosanguineous Exudate Type: red, brown red, brown red, brown Exudate Color: Distinct, outline attached Distinct, outline attached Distinct, outline attached Wound Margin: Small (1-33%) Large (67-100%) Large (67-100%) Granulation A mount: Red Red Red Granulation Quality: Large (67-100%) None Present (0%) Small (1-33%) Necrotic A mount: Eschar N/A Eschar Necrotic Tissue: Fat Layer (Subcutaneous Tissue): Yes Fat Layer (Subcutaneous Tissue): Yes Fat Layer (Subcutaneous Tissue): Yes Exposed Structures: Fascia: No Fascia: No Fascia: No Tendon: No Tendon: No Tendon: No Muscle: No Muscle: No Muscle: No Joint: No Joint: No Joint: No Bone: No Bone: No Bone: No Medium (34-66%) Small (1-33%) Small (1-33%) Epithelialization: Debridement - Selective/Open Wound N/A Debridement - Excisional Debridement: Pre-procedure Verification/Time Out 10:37 N/A 10:37 Taken: Other N/A Other Pain Control: Necrotic/Eschar N/A Necrotic/Eschar, Subcutaneous, Tissue Debrided: Slough Non-Viable Tissue N/A Skin/Subcutaneous Tissue Level: 0.8 N/A 2.4 Debridement A (sq cm): rea Curette N/A Curette Instrument: Minimum N/A Minimum Bleeding: Pressure N/A Pressure Hemostasis Achieved: 0 N/A 0 Procedural Pain: 0 N/A 0 Post Procedural Pain: Procedure was tolerated well N/A Procedure was tolerated well Debridement Treatment Response: 0.2x0.2x0.1 N/A 8.5x0.6x0.1 Post Debridement Measurements L x W x D (cm) 0.003 N/A 0.401 Post Debridement Volume: (cm) Debridement Compression Therapy  Debridement Procedures Performed: Negative Pressure Wound Therapy Maintenance (NPWT) Treatment Notes Electronic Signature(s) Signed: 11/30/2021 10:49:01 AM By: Fredirick Maudlin MD FACS Signed: 11/30/2021 4:29:02 PM By: Adline Peals Entered By: Fredirick Maudlin on 11/30/2021 10:49:00 -------------------------------------------------------------------------------- Multi-Disciplinary Care Plan Details Patient Name: Date of Service: [name] [name]. 11/30/2021 10:00 A M Medical Record Number: [id number] Patient Account Number: [id number] Date of Birth/Sex: Treating RN: 08-Feb-1951 (71 y.o. [name] Primary Care Marjie Chea: [name] Other Clinician: Referring Rori Goar: Treating Ynez Eugenio/Extender: [name] in Treatment: 33 Multidisciplinary Care Plan reviewed with physician Active Inactive Venous Leg Ulcer Nursing Diagnoses: Knowledge deficit related to disease process and management Potential for venous Insuffiency (use before diagnosis confirmed) Goals: Patient will maintain optimal edema control Date Initiated: 07/27/2021 Target Resolution Date: 12/14/2021 Goal Status: Active Interventions: Assess peripheral edema status every visit. Treatment Activities: Therapeutic compression applied : 07/27/2021 Notes: Wound/Skin Impairment Nursing Diagnoses: Impaired tissue integrity Knowledge deficit related to ulceration/compromised skin integrity Goals: Patient will have a decrease in wound volume by X% from date: (specify in notes) Date Initiated: 04/12/2021 Date Inactivated: 04/27/2021 Target Resolution Date: 04/23/2021 Goal Status: Met Patient/caregiver will verbalize understanding of skin  care regimen Date Initiated: 04/12/2021 Target Resolution Date: 12/14/2021 Goal Status: Active Ulcer/skin breakdown will have a volume reduction of 30% by week 4 Date Initiated: 04/12/2021 Date Inactivated: 04/27/2021 Target Resolution Date:  04/27/2021 Goal Status: Unmet Unmet Reason: infection Ulcer/skin breakdown will have a volume reduction of 50% by week 8 Date Initiated: 04/27/2021 Date Inactivated: 06/29/2021 Target Resolution Date: 06/24/2021 Goal Status: Met Interventions: Assess patient/caregiver ability to obtain necessary supplies Assess patient/caregiver ability to perform ulcer/skin care regimen upon admission and as needed Assess ulceration(s) every visit Notes: Electronic Signature(s) Signed: 11/30/2021 4:29:02 PM By: Adline Peals Entered By: Adline Peals on 11/30/2021 09:59:25 -------------------------------------------------------------------------------- Negative Pressure Wound Therapy Maintenance (NPWT) Details Patient Name: Date of Service: [name], KEANEY 11/30/2021 10:00 A M Medical Record Number: [id number] Patient Account Number: [id number] Date of Birth/Sex: Treating RN: 03-24-1951 (71 y.o. [name] Primary Care Kalli Greenfield: [name] Other Clinician: Referring Abdullah Rizzi: Treating Willow Shidler/Extender: [name] in Treatment: 33 NPWT Maintenance Performed for: Wound #22 Left, Lateral Lower Leg Additional Injuries Covered: No Performed By: Adline Peals, RN Type: VAC System Coverage Size (sq cm): 40.5 Pressure Type: Constant Pressure Setting: 125 mmHG Drain Type: None Sponge/Dressing Type: Combination : black and white Date Initiated: 07/27/2021 Dressing Removed: Yes Quantity of Sponges/Gauze Removed: 2 Canister Changed: Yes Dressing Reapplied: Yes Quantity of Sponges/Gauze Inserted: 2 Days On NPWT : 127 Post Procedure Diagnosis Same as Pre-procedure Electronic Signature(s) Signed: 11/30/2021 4:29:02 PM By: Adline Peals Entered By: Adline Peals on 11/30/2021 10:30:46 -------------------------------------------------------------------------------- Pain Assessment Details Patient Name: Date of Service: [name] [name].  11/30/2021 10:00 A M Medical Record Number: [id number] Patient Account Number: [id number] Date of Birth/Sex: Treating RN: 1951/01/10 (71 y.o. [name] Primary Care Frank Pilger: [name] Other Clinician: Referring Doretha Goding: Treating Issabella Rix/Extender: [name] in Treatment: 33 Active Problems Location of Pain Severity and Description of Pain Patient Has Paino Yes Site Locations Pain Location: Pain Location: Pain in Ulcers Duration of the Pain. Constant / Intermittento Intermittent Rate the pain. Current Pain Level: 3 Pain Management and Medication Current Pain Management: Medication: Yes Electronic Signature(s) Signed: 11/30/2021 4:29:02 PM By: Adline Peals Entered By: Adline Peals on 11/30/2021 10:06:10 -------------------------------------------------------------------------------- Patient/Caregiver Education Details Patient Name: Date of Service: [name], [name] 6/8/2023andnbsp10:00 A M Medical Record Number: [id number] Patient Account Number: [id number] Date of Birth/Gender: Treating RN: 07-Sep-1950 (71 y.o. [name] Primary Care Physician: [name] Other Clinician: Referring Physician: Treating Physician/Extender: [name] in Treatment: 8 Education Assessment Education Provided To: Patient Education Topics Provided Wound/Skin Impairment: Methods: Explain/Verbal Responses: Reinforcements needed, State content correctly Electronic Signature(s) Signed: 11/30/2021 4:29:02 PM By: Adline Peals Entered By: Adline Peals on 11/30/2021 09:59:37 -------------------------------------------------------------------------------- Wound Assessment Details Patient Name: Date of Service: [name] [name]. 11/30/2021 10:00 A M Medical Record Number: [id number] Patient Account Number: [id number] Date of Birth/Sex: Treating RN: 1950-10-05 (71 y.o. [name] Primary  Care Aradia Estey: [name] Other Clinician: Referring Cesiah Westley: Treating Kiaria Quinnell/Extender: [name] in Treatment: 33 Wound Status Wound Number: 18 Primary Diabetic Wound/Ulcer of the Lower Extremity Etiology: Wound Location: Left, Plantar Metatarsal head first Wound Open Wounding Event: Gradually Appeared Status: Date Acquired: 08/23/2020 Comorbid Glaucoma, Sleep Apnea, Hypertension, Peripheral Arterial Disease, Weeks Of Treatment: 33 History: Peripheral Venous Disease, Type II Diabetes, Gout, Osteoarthritis, Clustered Wound: No Neuropathy Photos Wound Measurements Length: (cm) 0.8 Width: (cm) 1 Depth: (cm) 0.1 Area: (cm) 0.628 Volume: (cm) 0.063 % Reduction in  Area: 95.6% % Reduction in Volume: 97.8% Epithelialization: Medium (34-66%) Tunneling: No Undermining: No Wound Description Classification: Grade 2 Wound Margin: Distinct, outline attached Exudate Amount: Medium Exudate Type: Serosanguineous Exudate Color: red, brown Foul Odor After Cleansing: No Slough/Fibrino No Wound Bed Granulation Amount: Small (1-33%) Exposed Structure Granulation Quality: Red Fascia Exposed: No Necrotic Amount: Large (67-100%) Fat Layer (Subcutaneous Tissue) Exposed: Yes Necrotic Quality: Eschar Tendon Exposed: No Muscle Exposed: No Joint Exposed: No Bone Exposed: No Treatment Notes Wound #18 (Metatarsal head first) Wound Laterality: Plantar, Left Cleanser Soap and Water Discharge Instruction: May shower and wash wound with dial antibacterial soap and water prior to dressing change. Wound Cleanser Discharge Instruction: Cleanse the wound with wound cleanser prior to applying a clean dressing using gauze sponges, not tissue or cotton balls. Peri-Wound Care Triamcinolone 15 (g) Discharge Instruction: Use triamcinolone 15 (g) as directed Sween Lotion (Moisturizing lotion) Discharge Instruction: Apply moisturizing lotion as directed Topical Primary  Dressing Promogran Prisma Matrix, 4.34 (sq in) (silver collagen) Discharge Instruction: Moisten collagen with saline or hydrogel Secondary Dressing Woven Gauze Sponge, Non-Sterile 4x4 in Discharge Instruction: Apply over primary dressing as directed. Secured With 28M Medipore H Soft Cloth Surgical T ape, 4 x 10 (in/yd) Discharge Instruction: Secure with tape as directed. Compression Wrap CoFlex TLC XL 2-layer Compression System 4x7 (in/yd) Discharge Instruction: Apply CoFlex 2-layer compression as directed. (alt for 4 layer) Compression Stockings Add-Ons Electronic Signature(s) Signed: 11/30/2021 4:29:02 PM By: Adline Peals Signed: 11/30/2021 4:51:47 PM By: Baruch Gouty RN, BSN Entered By: Baruch Gouty on 11/30/2021 10:22:15 -------------------------------------------------------------------------------- Wound Assessment Details Patient Name: Date of Service: [name] [name]. 11/30/2021 10:00 A M Medical Record Number: [id number] Patient Account Number: [id number] Date of Birth/Sex: Treating RN: [birth year] (71 y.o. [name] Primary Care Shivam Mestas: [name] Other Clinician: Referring Kelcey Wickstrom: Treating Lachlan Mckim/Extender: [name] in Treatment: 33 Wound Status Wound Number: 22 Primary Cyst Etiology: Wound Location: Left, Lateral Lower Leg Wound Open Wounding Event: Bump Status: Date Acquired: 06/03/2021 Comorbid Glaucoma, Sleep Apnea, Hypertension, Peripheral Arterial Disease, Weeks Of Treatment: 25 History: Peripheral Venous Disease, Type II Diabetes, Gout, Osteoarthritis, Clustered Wound: No Neuropathy Photos Wound Measurements Length: (cm) 13.5 Width: (cm) 3 Depth: (cm) 0.1 Area: (cm) 31.809 Volume: (cm) 3.181 % Reduction in Area: -1829% % Reduction in Volume: -141.2% Epithelialization: Small (1-33%) Tunneling: Yes Position (o'clock): 3 Maximum Distance: (cm) 3.4 Undermining: No Wound  Description Classification: Full Thickness With Exposed Support Structures Wound Margin: Distinct, outline attached Exudate Amount: Large Exudate Type: Serosanguineous Exudate Color: red, brown Wound Bed Granulation Amount: Large (67-100%) Granulation Quality: Red Necrotic Amount: None Present (0%) Foul Odor After Cleansing: No Slough/Fibrino No Exposed Structure Fascia Exposed: No Fat Layer (Subcutaneous Tissue) Exposed: Yes Tendon Exposed: No Muscle Exposed: No Joint Exposed: No Bone Exposed: No Treatment Notes Wound #22 (Lower Leg) Wound Laterality: Left, Lateral Cleanser Soap and Water Discharge Instruction: May shower and wash wound with dial antibacterial soap and water prior to dressing change. Wound Cleanser Discharge Instruction: Cleanse the wound with wound cleanser prior to applying a clean dressing using gauze sponges, not tissue or cotton balls. Peri-Wound Care Topical Primary Dressing VAC Secondary Dressing Secured With Compression Wrap Compression Stockings Add-Ons Electronic Signature(s) Signed: 11/30/2021 4:29:02 PM By: Adline Peals Signed: 11/30/2021 4:51:47 PM By: Baruch Gouty RN, BSN Entered By: Baruch Gouty on 11/30/2021 10:23:01 -------------------------------------------------------------------------------- Wound Assessment Details Patient Name: Date of Service: [name] [name]. 11/30/2021 10:00 A M Medical Record Number: [id number] Patient Account Number:  [id number] Date of Birth/Sex: Treating RN: [birth year] (71 y.o. [name] Primary Care Maxene Byington: [name] Other Clinician: Referring Leul Narramore: Treating Jasani Lengel/Extender: [name] in Treatment: 33 Wound Status Wound Number: 24R Primary Abrasion Etiology: Wound Location: Left, Medial Upper Leg Wound Open Wounding Event: Trauma Status: Date Acquired: 09/14/2021 Comorbid Glaucoma, Sleep Apnea, Hypertension, Peripheral Arterial  Disease, Weeks Of Treatment: 10 History: Peripheral Venous Disease, Type II Diabetes, Gout, Osteoarthritis, Clustered Wound: Yes Neuropathy Photos Wound Measurements Length: (cm) 8.5 Width: (cm) 0.6 Depth: (cm) 0.1 Clustered Quantity: 3 Area: (cm) 4.006 Volume: (cm) 0.401 % Reduction in Area: -142.9% % Reduction in Volume: -143% Epithelialization: Small (1-33%) Tunneling: No Undermining: No Wound Description Classification: Full Thickness Without Exposed Support Structures Wound Margin: Distinct, outline attached Exudate Amount: Medium Exudate Type: Serosanguineous Exudate Color: red, brown Foul Odor After Cleansing: No Slough/Fibrino No Wound Bed Granulation Amount: Large (67-100%) Exposed Structure Granulation Quality: Red Fascia Exposed: No Necrotic Amount: Small (1-33%) Fat Layer (Subcutaneous Tissue) Exposed: Yes Necrotic Quality: Eschar Tendon Exposed: No Muscle Exposed: No Joint Exposed: No Bone Exposed: No Treatment Notes Wound #24R (Upper Leg) Wound Laterality: Left, Medial Cleanser Wound Cleanser Discharge Instruction: Cleanse the wound with wound cleanser prior to applying a clean dressing using gauze sponges, not tissue or cotton balls. Peri-Wound Care Sween Lotion (Moisturizing lotion) Discharge Instruction: Apply moisturizing lotion as directed Topical Primary Dressing KerraCel Ag Gelling Fiber Dressing, 2x2 in (silver alginate) Discharge Instruction: Apply silver alginate to wound bed as instructed Secondary Dressing Bordered Gauze, 4x4 in Discharge Instruction: Apply over primary dressing as directed. Secured With Elastic Bandage 4 inch (ACE bandage) Discharge Instruction: Secure with ACE bandage as directed. Compression Wrap Compression Stockings Add-Ons Electronic Signature(s) Signed: 11/30/2021 4:29:02 PM By: Adline Peals Entered By: Adline Peals on 11/30/2021  10:26:27 -------------------------------------------------------------------------------- Vitals Details Patient Name: Date of Service: [name] [name]. 11/30/2021 10:00 A M Medical Record Number: [id number] Patient Account Number: [id number] Date of Birth/Sex: Treating RN: Dec 19, 1950 (71 y.o. [name] Primary Care Damoni Causby: [name] Other Clinician: Referring Ja Pistole: Treating Dannilynn Gallina/Extender: [name] in Treatment: 33 Vital Signs Time Taken: 10:06 Temperature (F): 98.6 Height (in): 74 Pulse (bpm): 71 Weight (lbs): 238 Respiratory Rate (breaths/min): 20 Body Mass Index (BMI): 30.6 Blood Pressure (mmHg): 138/77 Capillary Blood Glucose (mg/dl): 260 Reference Range: 80 - 120 mg / dl Electronic Signature(s) Signed: 11/30/2021 4:29:02 PM By: Adline Peals Entered By: Adline Peals on 11/30/2021 50:09:38

## 2021-11-30 NOTE — Progress Notes (Signed)
[name], [name] ([id number]) Visit Report for 11/30/2021 Chief Complaint Document Details Patient Name: Date of Service: [name], [name] 11/30/2021 10:00 A M Medical Record Number: [id number] Patient Account Number: [id number] Date of Birth/Sex: Treating RN: [birth year] (71 y.o. [name] Primary Care Provider: [name] Other Clinician: Referring Provider: Treating Provider/Extender: [name] in Treatment: 33 Information Obtained from: Patient Chief Complaint Left leg and foot ulcers 04/12/2021; patient is here for wounds on his left lower leg and left plantar foot over the first metatarsal head Electronic Signature(s) Signed: 11/30/2021 10:49:12 AM By: Fredirick Maudlin MD FACS Entered By: Fredirick Maudlin on 11/30/2021 10:49:11 -------------------------------------------------------------------------------- Debridement Details Patient Name: Date of Service: [name]. 11/30/2021 10:00 A M Medical Record Number: [id number] Patient Account Number: [id number] Date of Birth/Sex: Treating RN: [birth year] (71 y.o. [name] Primary Care Provider: [name] Other Clinician: Referring Provider: Treating Provider/Extender: [name] in Treatment: 33 Debridement Performed for Assessment: Wound #24R Left,Medial Upper Leg Performed By: Physician Fredirick Maudlin, MD Debridement Type: Debridement Level of Consciousness (Pre-procedure): Awake and Alert Pre-procedure Verification/Time Out Yes - 10:37 Taken: Start Time: 10:37 Pain Control: Other : benzocaine 2o % spray T Area Debrided (L x W): otal 4 (cm) x 0.6 (cm) = 2.4 (cm) Tissue and other material debrided: Viable, Non-Viable, Eschar, Slough, Subcutaneous, Slough Level: Skin/Subcutaneous Tissue Debridement Description: Excisional Instrument: Curette Bleeding: Minimum Hemostasis Achieved: Pressure Procedural Pain: 0 Post Procedural Pain: 0 Response to  Treatment: Procedure was tolerated well Level of Consciousness (Post- Awake and Alert procedure): Post Debridement Measurements of Total Wound Length: (cm) 8.5 Width: (cm) 0.6 Depth: (cm) 0.1 Volume: (cm) 0.401 Character of Wound/Ulcer Post Debridement: Improved Post Procedure Diagnosis Same as Pre-procedure Electronic Signature(s) Signed: 11/30/2021 12:34:21 PM By: Fredirick Maudlin MD FACS Signed: 11/30/2021 4:29:02 PM By: Adline Peals Entered By: Adline Peals on 11/30/2021 10:38:27 -------------------------------------------------------------------------------- Debridement Details Patient Name: Date of Service: [name]. 11/30/2021 10:00 A M Medical Record Number: [id number] Patient Account Number: [id number] Date of Birth/Sex: Treating RN: [birth year] (71 y.o. [name] Primary Care Provider: [name] Other Clinician: Referring Provider: Treating Provider/Extender: [name] in Treatment: 33 Debridement Performed for Assessment: Wound #18 Left,Plantar Metatarsal head first Performed By: Physician Fredirick Maudlin, MD Debridement Type: Debridement Severity of Tissue Pre Debridement: Fat layer exposed Level of Consciousness (Pre-procedure): Awake and Alert Pre-procedure Verification/Time Out Yes - 10:37 Taken: Start Time: 10:37 Pain Control: Other : benzocaine 2o % spray T Area Debrided (L x W): otal 0.8 (cm) x 1 (cm) = 0.8 (cm) Tissue and other material debrided: Non-Viable, Eschar Level: Non-Viable Tissue Debridement Description: Selective/Open Wound Instrument: Curette Bleeding: Minimum Hemostasis Achieved: Pressure Procedural Pain: 0 Post Procedural Pain: 0 Response to Treatment: Procedure was tolerated well Level of Consciousness (Post- Awake and Alert procedure): Post Debridement Measurements of Total Wound Length: (cm) 0.2 Width: (cm) 0.2 Depth: (cm) 0.1 Volume: (cm) 0.003 Character of Wound/Ulcer  Post Debridement: Improved Severity of Tissue Post Debridement: Fat layer exposed Post Procedure Diagnosis Same as Pre-procedure Electronic Signature(s) Signed: 11/30/2021 12:34:21 PM By: Fredirick Maudlin MD FACS Signed: 11/30/2021 4:29:02 PM By: Adline Peals Entered By: Adline Peals on 11/30/2021 10:39:14 -------------------------------------------------------------------------------- HPI Details Patient Name: Date of Service: [name]. 11/30/2021 10:00 A M Medical Record Number: [id number] Patient Account Number: [id number] Date of Birth/Sex: Treating RN: 09-16-1950 (71 y.o. [name] Primary Care Provider: Other Clinician: [name] Referring Provider: Treating Provider/Extender:  [name] in Treatment: 33 History of Present Illness HPI Description: 10/11/17; [name] is a 71 year old man who tells me that in 2015 he slipped down the latter traumatizing his left leg. He developed a wound in the same spot the area that we are currently looking at. He states this closed over for the most part although he always felt it was somewhat unstable. In 2016 he hit the same area with the door of his car had this reopened. He tells me that this is never really closed although sometimes an inflow it remains open on a constant basis. He has not been using any specific dressing to this except for topical antibiotics the nature of which were not really sure. His primary doctor did send him to see Dr. [name] of interventional cardiology. He underwent an angiogram on 08/06/17 and he underwent a PTA and directional atherectomy of the lesser distal SFA and popliteal arteries which resulted in brisk improvement in blood flow. It was noted that he had 2 vessel runoff through the anterior tibial and peroneal. He is also been to see vascular and interventional radiologist. He was not felt to have any significant superficial venous insufficiency. Presumably  is not a candidate for any ablation. It was suggested he come here for wound care. The patient is a type II diabetic on insulin. He also has a history of venous insufficiency. ABIs on the left were noncompressible in our clinic 10/21/17; patient we admitted to the clinic last week. 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 We put Iodosorb on him after an aggressive debridement and 3 layer compression. He complained of pain in his ankle and itching with is skin in fact he scratched the area on the medial calf superiorly at the rim of our wraps and he has 2 small open areas in that location today which are new. I changed his primary dressing today to silver collagen. As noted he is already had revascularization and does not have any significant superficial venous insufficiency that would be amenable to ablation 10/28/17; patient admitted to the clinic 2 weeks ago. He has a smaller Wound. Scratch injury from last week revealed. There is large wound over the tibial area. This is smaller. Granulation looks healthy. No need for debridement. 11/04/17; the wound on the left lateral calf looks better. Improved dimensions. Surface of this looks better. We've been maintaining him and Kerlix Coban wraps. He finds this much more comfortable. Silver collagen dressing 11/11/17; left lateral Wound continues to look healthy be making progress. Using a #5 curet I removed removed nonviable skin from the surface of the wound and then necrotic debris from the wound surface. Surface of the wound continues to look healthy. He also has an open area on the left great toenail bed. We've been using topical antibiotics. 11/19/17; left anterior lateral wound continues to look healthy but it's not closed. He also had a small wound above this on the left leg Initially traumatic wounds in the setting of significant chronic venous insufficiency and stasis  dermatitis 11/25/17; left anterior wounds superiorly is closed still a small wound inferiorly. 12/02/17; left anterior tibial area. Arrives today with adherent callus. Post debridement clearly not completely closed. Hydrofera Blue under 3 layer compression. 12/09/17; left anterior tibia. Circumferential eschar however the wound bed looks stable to improved. We've been using Hydrofera Blue under 3 layer compression 12/17/17; left anterior tibia. Apparently this was  felt to be closed however when the wrap was taken off there is a skin tear to reopen wounds in the same area we've been using Hydrofera Blue under 3 layer compression 12/23/17 left anterior tibia. Not close to close this week apparently the [facility] was stuck to this again. Still circumferential eschar requiring debridement. I put a contact layer on this this time under the Hydrofera Blue 12/31/17; left anterior tibia. Wound is better slight amount of hyper-granulation. Using Hydrofera Blue over Adaptic. 01/07/18; left anterior tibia. The wound had some surface eschar however after this was removed he has no open wound.he was already revascularized by Dr. [name] when he came to our clinic with atherectomy of the left SFA and popliteal artery. He was also sent to interventional radiology for venous reflux studies. He was not felt to have significant reflux but certainly has chronic venous changes of his skin with hemosiderin deposition around this area. He will definitely need to lubricate his skin and wear compression stocking and I've talked to him about this. READMISSION 05/26/2018 This is a now 71 year old man we cared for with traumatic wounds on his left anterior lower extremity. He had been previously revascularized during that admission by Dr. [name]. Apparently in follow-up Dr. [name] noted that he had deterioration in his arterial status. He underwent a stent placement in the distal left SFA on 04/22/2018. 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 He went back to the angiography suite on 04/30/2018 he underwent PTA and balloon angioplasty of the occluded left mid anterior tibial artery, thrombotic occlusion went from 100 to 0% which reconstitutes the posterior tibial artery. He had thrombectomy and aspiration of the peroneal artery. The stent placed in the distal SFA left SFA was still occluded. 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 He has been recuperating at [facility] in [facility] after the most recent hospitalization. He is going home tomorrow. He tells me that 2 weeks ago he traumatized the tip of his left fifth toe. He came in urgently for our review of this. This was a history of before I noted that Dr. [name] had already noted dry gangrenous changes of the left fifth toe 06/09/2018; 2-week follow-up. I did contact Dr. [name] after his last appointment and he apparently saw 1 of Dr. Irven Shelling colleagues the next day. He does not follow-up with Dr. [name] himself until Thursday of this week. 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 Nevertheless there is no evidence of infection no drainage and no pain. He had a new area that this week when we were signing him in today on the left anterior mid tibia area, this is in close proximity to the previous wound we have dealt with in this clinic. 06/23/2018; 2-week follow-up. I did not receive a recent note from Dr. [name] to review today. Our office is trying to obtain this. He is apparently not planning to do further vascular interventions and wondered about compression to try and help with the patient's chronic venous insufficiency. However we are also concerned about the arterial flow. He  arrives in clinic today with a new area on the left third toe. The areas on the calf/anterior tibia are  close to closing. The left fifth toe is still mummified using Betadine. -In reviewing things with the patient he has what sounds like claudication with mild to moderate amount of activity. 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 I prescribed Levaquin over the phone while we attempted to arrange a plan of care. However the patient called yesterday to report he had low-grade fever and he came in today acutely. There is been a marked deterioration in the left third toe with spreading cellulitis up into the dorsal left foot. He was referred to the emergency room. Readmission: 06/29/2020 patient presents today for reevaluation here in our clinic he was previously treated by Dr. Dellia Nims at the latter part of 2019 in 2 the beginning of 2020. Subsequently we have not seen him since that time in the interim he did have evaluation with vein and vascular specialist specifically Dr. Anice Paganini who did perform quite extensive work for a left femoral to anterior tibial artery bypass. With that being said in the interim the patient has developed significant lymphedema and has wounds that he tells me have really never healed in regard to the incision site on the left leg. He also has multiple wounds on the feet for various reasons some of which is that he tends to pick at his feet. Fortunately there is no signs of active infection systemically at this time he does have some wounds that are little bit deeper but most are fairly superficial he seems to have good blood flow and overall everything appears to be healthy I see no bone exposed and no obvious signs of osteomyelitis. I do not know that he necessarily needs a x-ray at this point although that something we could consider depending on how things progress. The patient does have a history of lymphedema, diabetes, this is type II,  chronic kidney disease stage III, hypertension, and history of peripheral vascular disease. 07/05/2020; patient admitted last week. Is a patient I remember from 2019 he had a spreading infection involving the left foot and we sent him to the hospital. 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 He subsequently had a left femoral to anterior tibial bypass by Dr.Cain vein and vascular. He also has severe lymphedema with chronic skin changes related to that on the left leg. The most problematic area that was new today was on the left medial great toe. This was apparently a small area last week there was purulent drainage which our intake nurse cultured. Also areas on the left medial foot and heel left lateral foot. He has 2 areas on the left medial calf left lateral calf in the setting of the severe lymphedema. 07/13/2020 on evaluation today patient appears to be doing better in my opinion compared to his last visit. The good news is there is no signs of active infection systemically and locally I do not see any signs of infection either. He did have an x-ray which was negative that is great news he had a culture which showed MRSA but at the same time he is been on the doxycycline which has helped. I do think we may want to extend this for 7 additional days 1/25; patient admitted to the clinic a few weeks ago. 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 We have been putting him under compression his edema control is a lot better but he is severe verricused skin  on the left leg. He is really done quite well he still has an open area on the left medial calf and the left medial first metatarsal head. We have been using silver collagen on the leg silver alginate on the foot 07/27/2020 upon evaluation today patient appears to be doing decently well in regard to his wounds. He still has a lot of dry skin on the left leg. Some of this  is starting to peel back and I think he may be able to have them out by removing some that today. Fortunately there is no signs of active infection at this time on the left leg although on the right leg he does appear to have swelling and erythema as well as some mild warmth to touch. This does have been concerned about the possibility of cellulitis although within the differential diagnosis I do think that potentially a DVT has to be at least considered. We need to rule that out before proceeding would just call in the cellulitis. Especially since he is having pain in the posterior aspect of his calf muscle. 2/8; the patient had seen sparingly. He has severe skin changes of chronic lymphedema in the left leg thickened hyperkeratotic verrucous skin. He has an open wound on the medial part of the left first met head left mid tibia. He also has a rim of nonepithelialized skin in the anterior mid tibia. He brought in the AmLactin lotion that was been prescribed although I am not sure under compression and its utility. There concern about cellulitis on the right lower leg the last time he was here. He was put on on antibiotics. His DVT rule out was negative. The right leg looks fine he is using his stocking on this area 08/10/2020 upon evaluation today patient appears to be doing well with regard to his leg currently. He has been tolerating the dressing changes without complication. Fortunately there is no signs of active infection which is great news. Overall very pleased with where things stand. 2/22; the patient still has an area on the medial part of the left first met his head. This looks better than when I last saw this earlier this month he has a rim of epithelialization but still some surface debris. Mostly everything on the left leg is healed. There is still a vulnerable in the left mid tibia area. 08/30/2020 upon evaluation today patient appears to be doing much better in regard to his wounds on his  foot. 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 Nonetheless I think we will address that today I Minna send in a prescription for him in that regard. Overall though there does not appear to be any signs of significant worsening. 09/07/2020 on evaluation today patient's wounds over his left foot appear to be doing excellent. I do not see any signs of infection there is some callus buildup this can require debridement for certain but overall I feel like he is managing quite nicely. He still using the AmLactin cream which has been beneficial for him as well. 3/22; left foot wound is closed. There is no open area here. He is using ammonium lactate lotion to the lower extremities to help exfoliate dry cracked skin. He has compression stockings from elastic therapy in [place]. The wound on the medial part of his left first met head is healed today. READMISSION 04/12/2021 Mr.  [name] is a patient we know fairly well he had a prolonged stay in clinic in 2019 with wounds on his left lateral and left anterior lower extremity in the setting of chronic venous insufficiency. More recently he was here earlier this year with predominantly an area on his left foot first metatarsal head plantar and he says the plantar foot broke down on its not long after we discharged him but he did not come back here. The last few months areas of broken down on his left anterior and again the left lateral lower extremity. The leg itself is very swollen chronically enlarged a lot of hyperkeratotic dry Berry Q skin in the left lower leg. His edema extends well into the thigh. He was seen by Dr. Donzetta Matters. He had ABIs on 03/02/2021 showing an ABI on the right of 1 with a TBI of 0.72 his ABI in the left at 1.09 TBI of 0.99. Monophasic and biphasic waveforms on the right. On the left monophasic waveforms were  noted he went on to have an angiogram on 03/27/2021 this showed the aortic aortic and iliac segments were free of flow-limiting stenosis the left common femoral vein to evaluate the left femoral to anterior tibial artery bypass was unobstructed the bypass was patent without any areas of stenosis. We discharged the patient in bilateral juxta lite stockings but very clearly that was not sufficient to control the swelling and maintain skin integrity. He is clearly going to need compression pumps. 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 This is fortunate because he is on his feet for long periods of time. 10/27; patient comes in with our intake nurse reporting copious amount of green drainage from the left anterior mid tibia the left dorsal foot and to a lesser extent the left medial mid tibia. We left the compression wrap on all week for the amount of edema in his left leg is quite a bit better. We use silver alginate as the primary dressing 11/3; edema control is good. Left anterior lower leg left medial lower leg and the plantar first metatarsal head. The left anterior lower leg required debridement. Deep tissue culture I did of this wound showed MRSA I put him on 10 days of doxycycline which she will start today. We have him in compression wraps. He has a security card and [employer] however he is retiring on November 15. We will need to then get him into a better offloading boot for the left foot perhaps a total contact cast 11/10; edema control is quite good. Left anterior and left medial lower leg wounds in the setting of chronic venous insufficiency and lymphedema. He also has a substantial area over the left plantar first metatarsal head. 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 He has significant hypergranulation on the left plantar foot wound. The  patient is a diabetic but he does not have significant PAD 11/17; edema control is quite good. Left anterior and left medial lower leg wounds look better. The really concerning area remains the area on the left plantar first metatarsal head. He has a rim of epithelialization. 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 Starting today with a forefoot off loader but .  possibly a total contact cast. 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 Arrives with a very significant odor from I think all of the wounds on the left leg and the left foot. Because of this I did not put a total contact cast on him today but will could still consider this. His wife was having cataract surgery which is the reason he missed the appointment 12/6. I saw this man 5 days ago with a swelling below the popliteal fossa. I thought he actually might have a Baker's cyst however the DVT rule out study that we could arrange right away was negative the technician told me this was not a ruptured Baker's cyst. We attempted to get this aspirated by under ultrasound guidance in interventional radiology however all they did was an ultrasound however it shows an extensive fluid collection 62 x 8 x 9.4 in the left thigh and left calf. The patient states he thinks this started 8 days ago or so but he really is not complaining of any pain, fever or systemic symptoms. He has not ha 12/20; after some difficulty I managed to get the patient into see Dr. Donzetta Matters. Eventually he was taken into the hospital and had a drain put in the fluid collection below his left knee posteriorly extending into the posterior thigh. He still has the drain in place. Culture of this showed moderate staff aureus few Morganella and few Klebsiella he is  now on doxycycline and ciprofloxacin as suggested by infectious disease he is on this for a month. The drain will remain in place until it stops draining 12/29; he comes in today with the 1 wound on his left leg and the area on the left plantar first met head significantly smaller. Both look healthy. He still has the drain in the left leg. He says he has to change this daily. Follows up with Dr. Donzetta Matters on January 11. 06/29/2021; the wounds that I am following on the left leg and left first met head continued to be quite healthy. However the area where his inferior drain is in place had copious amounts of drainage which was green in color. The wound here is larger. Follows up with Dr. Gwenlyn Saran of vein and vascular his surgeon next week as well as infectious disease. He remains on ciprofloxacin and doxycycline. He is not complaining of excessive pain in either one of the drain areas 1/12; the patient saw vascular surgery and infectious disease. Vascular surgery has left the drain in place as there was still some notable drainage still see him back in 2 weeks. Dr. Velna Ochs stop the doxycycline and ciprofloxacin and I do not believe he follows up with them at this point. Culture I did last week showed both doxycycline resistant MRSA and Pseudomonas not sensitive to ciprofloxacin although only in rare titers 1/19; the patient's wound on the left anterior lower leg is just about healed. We have continued healing of the area that was medially on the left leg. Left first plantar metatarsal head continues to get smaller. The major problem here is his 2 drain sites 1 on the left upper calf and lateral thigh. There is purulent drainage still from the left lateral thigh. I gave him antibiotics last week but we still have recultured. He has the drain in the area I think this is eventually going to have to come  out. I suspect there will be a connecting wound to heal here perhaps with improved VAc 1/26; the patient had his  drain removed by vein and vascular on 1/25/. This was a large pocket of fluid in his left thigh that seem to tunnel into his left upper calf. 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 His mention his Penrose drain was removed today. He now has a tunneling wound on his left calf and left thigh. Both of these probe widely towards each other although I cannot really prove that they connect. Both wounds on his lower leg anteriorly are closed and his area over the first metatarsal head on his right foot continues to improve. We are using Hydrofera Blue here. He also saw infectious disease culture of the abscess they noted was polymicrobial with MRSA, Morganella and Klebsiella he was treated with doxycycline and ciprofloxacin for 4 weeks ending on 07/03/2021. They did not recommend any further antibiotics. Notable that while he still had the Penrose drain in place last week he had purulent drainage coming out of the inferior IandD site this grew [facility], MRSA and Pseudomonas but there does not appear to be any active infection in this area today with the drain out and he is not systemically unwell 2/2; with regards to the drain sites the superior one on the thigh actually is closed down the one on the upper left lateral calf measures about 8 and half centimeters which is an improvement seems to be less prominent although still with a lot of drainage. The only remaining wound is over the first metatarsal head on the left foot and this looks to be continuing to improve with Hydrofera Blue. 2/9; the area on his plantar left foot continues to contract. Callus around the wound edge. The drain sites specifically have not come down in depth. We put the wound VAC on Monday he changed the canister late last night our intake nurse reported a pocket of fluid perhaps caused by our compression wraps 2/16; continued improvement in left foot plantar wound. drainage site in the calf is not improved  in terms of depth (wound vac) 2/23; continued improvement in the left foot wound over the first metatarsal head. With regards to the drain sites the area on his thigh laterally is healed however the open area on his calf is small in terms of circumference by still probes in by about 15 cm. Within using the wound VAC. Hydrofera Blue on his foot 08/24/2021: The left first metatarsal head wound continues to improve. The wound bed is healthy with just some surrounding callus. Unfortunately the open drain site on his calf remains open and tunnels at least 15 cm (the extent of a Q-tip). This is despite several weeks of wound VAC treatment. Based on reading back through the notes, there has been really no significant change in the depth of the wound, although the orifice is smaller and the more cranial wound on his thigh has closed. I suspect the tunnel tracks nearly all the way to this location. 08/31/2021: Continued improvement in the left first metatarsal head wound. There has been absolutely no improvement to the long tunnel from his open drain site on his calf. We have tried to get him into see vascular surgery sooner to consider the possibility of simply filleting the tract open and allowing it to heal from the bottom up, likely with a wound VAC. They have not yet scheduled a sooner appointment than his current mid  April 09/14/2021: He was seen by vascular surgery and they took him to the operating room last week. They opened a portion of the tunnel, but did not extend the entire length of the known open subcutaneous tract. I read Dr. Claretha Cooper operative note and it is not clear from that documentation why only a portion of the tract was opened. The heaped up granulation tissue was curetted and removed from at least some portion of the tract. They did place a wound VAC and applied an Unna boot to the leg. The ulcer on his left first metatarsal head is smaller today. The bed looks good and there is just a small  amount of surrounding callus. 09/21/2021: The ulcer on his left first metatarsal head looks to be stalled. There is some callus surrounding the wound but the wound bed itself does not appear particularly dynamic. The tunnel tract on his lateral left leg seems to be roughly the same length or perhaps slightly smaller but the wound bed appears healthy with good granulation tissue. He opened up a new wound on his medial thigh and the site of a prior surgical incision. He says that he did this unconsciously in his sleep by scratching. 09/28/2021: Unfortunately, the ulcer on his left first metatarsal head has extended underneath the callus toward the dorsum of his foot. The medial thigh wounds are roughly the same. The tunnel on his lateral left leg continues to be problematic; it is longer than we are able to actually probe with a Q-tip. I am still not certain as to why Dr. Donzetta Matters did not open this up entirely when he took the patient to the operating room. We will likely be back in the same situation with just a small superficial opening in a long unhealed tract, as the open portion is granulating in nicely. 10/02/2021: The patient was initially scheduled for a nurse visit, but we are also applying a total contact cast today. The plantar foot wound looks clean without significant accumulated callus. We have been applying Prisma silver collagen to the site. 10/05/2021: The patient is here for his first total contact cast change. We have tried using gauze packing strips in the tunnel on his lateral leg wound, but this does not seem to be working any better than the white VAC foam. The foot ulcer looks about the same with minimal periwound callus. Medial thigh wound is clean with just some overlying eschar. 10/12/2021: The plantar foot wound is stable without any significant accumulation of periwound callus. The surface is viable with good granulation tissue. The medial thigh wounds are much smaller and are  epithelializing. On the other hand, he had purulent drainage coming from the tunnel on his lateral leg. He does go back to see Dr. Donzetta Matters next week and is planning to ask him why the wound tunnel was not completely opened at the time of his most recent operation. 10/19/2021: The plantar foot wound is markedly improved and has epithelial tissue coming through the surface. The medial thigh wounds are nearly closed with just a tiny open area. He did see Dr. Donzetta Matters earlier this week and apparently they did discuss the possibility of opening the sinus tract further and enabling a wound VAC application. Apparently there are some limits as to what Dr. 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 I think if we could get the tract open to the level of the popliteal fossa, this would greatly aid in her ability to get this chart  closed. That being said, however, today when I probed the tract with a Q-tip, I was not able to insert the entirety of the Q-tip as I have on previous occasions. The tunnel is shorter by about 4 cm. The surface is clean with good granulation tissue and no further episodes of purulent drainage. 10/30/2021: Last week, the patient underwent surgery and had the long tract in his leg opened. There was a rind that was debrided, according to the operative report. His medial thigh ulcers are closed. The plantar foot wound is clean with a good surface and some built up surrounding callus. 11/06/2021: The overall dimensions of the large wound on his lateral leg remain about the same, but there is good granulation tissue present and the tunneling is a little bit shorter. He has a new wound on his anterior tibial surface, in the same location where he had a similar lesion in the past. The plantar foot wound is clean with some buildup surrounding callus. Just toward the medial aspect of his foot, however, there is an area of darkening that once debrided, revealed another  opening in the skin surface. 11/13/2021: The anterior tibial surface wound is closed. The plantar foot wound has some surrounding callus buildup. The area of darkening that I debrided last week and revealed an opening in the skin surface has closed again. The tunnel in the large wound on his lateral leg has come in by about 3 cm. There is healthy granulation tissue on the entire wound surface. 11/23/2021: The patient was out of town last week and did wet-to-dry dressings on his large wound. 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 Unfortunately, he picked open the wound on his left medial thigh. He says that it was itching and he just could not stop scratching it until it was open again. The wound on his plantar foot is smaller and has not accumulated a tremendous amount of callus. The lateral leg wound is shallower and the tunnel has also decreased in depth. There is just a little bit of slough accumulation on the surface. 11/30/2021: Another portion of his left medial thigh has been opened up. All of these wounds are fairly superficial with just a little bit of slough and eschar accumulation. The wound on his plantar foot is almost closed with just a bit of eschar and periwound callus accumulation. The lateral leg wound is nearly flush with the surrounding skin and the tunnel is markedly shallower. Electronic Signature(s) Signed: 11/30/2021 10:50:11 AM By: Fredirick Maudlin MD FACS Entered By: Fredirick Maudlin on 11/30/2021 10:50:11 -------------------------------------------------------------------------------- Physical Exam Details Patient Name: Date of Service: [name]. 11/30/2021 10:00 A M Medical Record Number: [id number] Patient Account Number: [id number] Date of Birth/Sex: Treating RN: [birth year] (71 y.o. [name] Primary Care Provider: [name] Other Clinician: Referring Provider: Treating  Provider/Extender: [name] in Treatment: 33 Constitutional . . . . No acute distress.Marland Kitchen Respiratory Normal work of breathing on room air.. Notes 11/30/2021: Another portion of his left medial thigh has been opened up. All of these wounds are fairly superficial with just a little bit of slough and eschar accumulation. The wound on his plantar foot is almost closed with just a bit of eschar and periwound callus accumulation. The lateral leg wound is nearly flush with the surrounding skin and the tunnel is markedly shallower. Electronic Signature(s) Signed: 11/30/2021 10:50:48 AM By: Fredirick Maudlin MD FACS  Entered By: Fredirick Maudlin on 11/30/2021 10:50:48 -------------------------------------------------------------------------------- Physician Orders Details Patient Name: Date of Service: HILDING, QUINTANAR 11/30/2021 10:00 A M Medical Record Number: [id number] Patient Account Number: [id number] Date of Birth/Sex: Treating RN: Jul 05, 1950 (71 y.o. [name] Primary Care Provider: [name] Other Clinician: Referring Provider: Treating Provider/Extender: [name] in Treatment: 57 Verbal / Phone Orders: No Diagnosis Coding ICD-10 Coding Code Description E11.621 Type 2 diabetes mellitus with foot ulcer 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 I89.0 Lymphedema, not elsewhere classified I87.322 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verity 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 - Dr Celine Ahr - Room 2 - Return A Nurse Visit: - mondays Bathing/ Shower/ Hygiene May shower with  protection but do not get wound dressing(s) wet. - Use a cast protector so you can shower without getting your wrap(s) wet Negative Presssure Wound Therapy Wound #22 Left,Lateral Lower Leg Wound Vac to wound continuously at 144m/hg pressure - change 2 times per week in clinic Black Foam White Foam Edema Control - Lymphedema / SCD / Other Elevate legs to the level of the heart or above for 30 minutes daily and/or when sitting, a frequency of: - throughout the day Avoid standing for long periods of time. Patient to wear own compression stockings every day. - on right leg; Moisturize legs daily. - Ammonium lactate to right leg daily Wound Treatment Wound #18 - Metatarsal head first Wound Laterality: Plantar, Left Cleanser: Soap and Water 2 x Per Week/30 Days Discharge Instructions: May shower and wash wound with dial antibacterial soap and water prior to dressing change. Cleanser: Wound Cleanser 2 x Per Week/30 Days Discharge Instructions: Cleanse the wound with wound cleanser prior to applying a clean dressing using gauze sponges, not tissue or cotton balls. Peri-Wound Care: Triamcinolone 15 (g) 2 x Per Week/30 Days Discharge Instructions: Use triamcinolone 15 (g) as directed Peri-Wound Care: Sween Lotion (Moisturizing lotion) 2 x Per Week/30 Days Discharge Instructions: Apply moisturizing lotion as directed Prim Dressing: Promogran Prisma Matrix, 4.34 (sq in) (silver collagen) 2 x Per Week/30 Days ary Discharge Instructions: Moisten collagen with saline or hydrogel Secondary Dressing: Woven Gauze Sponge, Non-Sterile 4x4 in 2 x Per Week/30 Days Discharge Instructions: Apply over primary dressing as directed. Secured With: 74M Medipore H Soft Cloth Surgical T ape, 4 x 10 (in/yd) 2 x Per Week/30 Days Discharge Instructions: Secure with tape as directed. Compression Wrap: CoFlex TLC XL 2-layer Compression System 4x7 (in/yd) 2 x Per Week/30 Days Discharge Instructions: Apply CoFlex 2-layer  compression as directed. (alt for 4 layer) Wound #22 - Lower Leg Wound Laterality: Left, Lateral Cleanser: Soap and Water 1 x Per Week/30 Days Discharge Instructions: May shower and wash wound with dial antibacterial soap and water prior to dressing change. Cleanser: Wound Cleanser 1 x Per Week/30 Days Discharge Instructions: Cleanse the wound with wound cleanser prior to applying a clean dressing using gauze sponges, not tissue or cotton balls. Prim Dressing: VAC ary 1 x Per Week/30 Days Wound #24R - Upper Leg Wound Laterality: Left, Medial Cleanser: Wound Cleanser 2 x Per Week/30 Days Discharge Instructions: Cleanse the wound with wound cleanser prior to applying a clean dressing using gauze sponges, not tissue or  cotton balls. Peri-Wound Care: Sween Lotion (Moisturizing lotion) 2 x Per Week/30 Days Discharge Instructions: Apply moisturizing lotion as directed Prim Dressing: KerraCel Ag Gelling Fiber Dressing, 2x2 in (silver alginate) 2 x Per Week/30 Days ary Discharge Instructions: Apply silver alginate to wound bed as instructed Secondary Dressing: Bordered Gauze, 4x4 in 2 x Per Week/30 Days Discharge Instructions: Apply over primary dressing as directed. Secured With: Elastic Bandage 4 inch (ACE bandage) 2 x Per Week/30 Days Discharge Instructions: Secure with ACE bandage as directed. Electronic Signature(s) Signed: 11/30/2021 12:34:21 PM By: Fredirick Maudlin MD FACS Entered By: Fredirick Maudlin on 11/30/2021 10:51:10 -------------------------------------------------------------------------------- Problem List Details Patient Name: Date of Service: [name]. 11/30/2021 10:00 A M Medical Record Number: [id number] Patient Account Number: [id number] Date of Birth/Sex: Treating RN: 1951/06/05 (71 y.o. [name] Primary Care Provider: [name] Other Clinician: Referring Provider: Treating Provider/Extender: [name] in Treatment:  84 Active Problems ICD-10 Encounter Code Description Active Date MDM Diagnosis E11.621 Type 2 diabetes mellitus with foot ulcer 04/12/2021 No Yes 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 04/12/2021 No Yes I89.0 Lymphedema, not elsewhere classified 04/12/2021 No Yes I87.322 Chronic venous hypertension (idiopathic) with inflammation of left lower 04/12/2021 No Yes extremity 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 ulcer of other part of left lower leg with other specified 04/12/2021 No Yes severity 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 of other part of left foot with other specified 04/12/2021 No Yes severity 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 07/20/2021 No Yes Inactive Problems ICD-10 Code Description Active Date Inactive Date E11.42 Type 2 diabetes mellitus with diabetic polyneuropathy 04/12/2021 04/12/2021 L02.416 Cutaneous abscess of left lower limb 06/13/2021 06/13/2021 Resolved Problems Electronic Signature(s) Signed: 11/30/2021 10:48:34 AM By: Fredirick Maudlin MD FACS Signed: 11/30/2021 10:48:34 AM By: Fredirick Maudlin MD FACS Entered By: Fredirick Maudlin on 11/30/2021 10:48:34 -------------------------------------------------------------------------------- Progress Note Details Patient Name: Date of Service: [name]. 11/30/2021 10:00 A M Medical Record Number: [id number] Patient Account Number: [id number] Date of Birth/Sex: Treating RN: [birth year] (71 y.o. [name] Primary Care Provider: [name] Other Clinician: Referring Provider: Treating Provider/Extender: [name] in Treatment: 33 Subjective Chief Complaint Information obtained from Patient Left leg and foot ulcers 04/12/2021; patient is here for wounds on his left lower leg and left plantar foot over the first metatarsal head History of Present Illness (HPI) 10/11/17; [name] is a 71 year old man  who tells me that in 2015 he slipped down the latter traumatizing his left leg. He developed a wound in the same spot the area that we are currently looking at. He states this closed over for the most part although he always felt it was somewhat unstable. In 2016 he hit the same area with the door of his car had this reopened. He tells me that this is never really closed although sometimes an inflow it remains open on a constant basis. He has not been using any specific dressing to this except for topical antibiotics the nature of which were not really sure. His primary doctor did send him to see Dr. [name] of interventional cardiology. He underwent an angiogram on 08/06/17 and he underwent a PTA and directional atherectomy of the lesser distal SFA and popliteal arteries which resulted in brisk improvement in blood flow. It was noted that he had 2 vessel runoff through the anterior tibial and peroneal. He is also been to see vascular and interventional radiologist. He was  not felt to have any significant superficial venous insufficiency. Presumably is not a candidate for any ablation. It was suggested he come here for wound care. The patient is a type II diabetic on insulin. He also has a history of venous insufficiency. ABIs on the left were noncompressible in our clinic 10/21/17; patient we admitted to the clinic last week. 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 We put Iodosorb on him after an aggressive debridement and 3 layer compression. He complained of pain in his ankle and itching with is skin in fact he scratched the area on the medial calf superiorly at the rim of our wraps and he has 2 small open areas in that location today which are new. I changed his primary dressing today to silver collagen. As noted he is already had revascularization and does not have any significant superficial venous insufficiency that would be  amenable to ablation 10/28/17; patient admitted to the clinic 2 weeks ago. He has a smaller Wound. Scratch injury from last week revealed. There is large wound over the tibial area. This is smaller. Granulation looks healthy. No need for debridement. 11/04/17; the wound on the left lateral calf looks better. Improved dimensions. Surface of this looks better. We've been maintaining him and Kerlix Coban wraps. He finds this much more comfortable. Silver collagen dressing 11/11/17; left lateral Wound continues to look healthy be making progress. Using a #5 curet I removed removed nonviable skin from the surface of the wound and then necrotic debris from the wound surface. Surface of the wound continues to look healthy. ooHe also has an open area on the left great toenail bed. We've been using topical antibiotics. 11/19/17; left anterior lateral wound continues to look healthy but it's not closed. ooHe also had a small wound above this on the left leg ooInitially traumatic wounds in the setting of significant chronic venous insufficiency and stasis dermatitis 11/25/17; left anterior wounds superiorly is closed still a small wound inferiorly. 12/02/17; left anterior tibial area. Arrives today with adherent callus. Post debridement clearly not completely closed. Hydrofera Blue under 3 layer compression. 12/09/17; left anterior tibia. Circumferential eschar however the wound bed looks stable to improved. We've been using Hydrofera Blue under 3 layer compression 12/17/17; left anterior tibia. Apparently this was felt to be closed however when the wrap was taken off there is a skin tear to reopen wounds in the same area we've been using Hydrofera Blue under 3 layer compression 12/23/17 left anterior tibia. Not close to close this week apparently the [facility] was stuck to this again. Still circumferential eschar requiring debridement. I put a contact layer on this this time under the Hydrofera Blue 12/31/17;  left anterior tibia. Wound is better slight amount of hyper-granulation. Using Hydrofera Blue over Adaptic. 01/07/18; left anterior tibia. The wound had some surface eschar however after this was removed he has no open wound.he was already revascularized by Dr. [name] when he came to our clinic with atherectomy of the left SFA and popliteal artery. He was also sent to interventional radiology for venous reflux studies. He was not felt to have significant reflux but certainly has chronic venous changes of his skin with hemosiderin deposition around this area. He will definitely need to lubricate his skin and wear compression stocking and I've talked to him about this. READMISSION 05/26/2018 This is a now 71 year old man we cared for with traumatic wounds on his left anterior  lower extremity. He had been previously revascularized during that admission by Dr. [name]. Apparently in follow-up Dr. [name] noted that he had deterioration in his arterial status. He underwent a stent placement in the distal left SFA on 04/22/2018. 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 He went back to the angiography suite on 04/30/2018 he underwent PTA and balloon angioplasty of the occluded left mid anterior tibial artery, thrombotic occlusion went from 100 to 0% which reconstitutes the posterior tibial artery. He had thrombectomy and aspiration of the peroneal artery. The stent placed in the distal SFA left SFA was still occluded. 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 He has been recuperating at [place] home in [facility] after the most recent hospitalization. He is going home tomorrow. He tells me that 2 weeks ago he traumatized the tip of his left  fifth toe. He came in urgently for our review of this. This was a history of before I noted that Dr. [name] had already noted dry gangrenous changes of the left fifth toe 06/09/2018; 2-week follow-up. I did contact Dr. [name] after his last appointment and he apparently saw 1 of Dr. Irven Shelling colleagues the next day. He does not follow-up with Dr. [name] himself until Thursday of this week. 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 Nevertheless there is no evidence of infection no drainage and no pain. He had a new area that this week when we were signing him in today on the left anterior mid tibia area, this is in close proximity to the previous wound we have dealt with in this clinic. 06/23/2018; 2-week follow-up. I did not receive a recent note from Dr. [name] to review today. Our office is trying to obtain this. He is apparently not planning to do further vascular interventions and wondered about compression to try and help with the patient's chronic venous insufficiency. However we are also concerned about the arterial flow. ooHe arrives in clinic today with a new area on the left third toe. The areas on the calf/anterior tibia are close to closing. The left fifth toe is still mummified using Betadine. -In reviewing things with the patient he has what sounds like claudication with mild to moderate amount of activity. 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 I prescribed Levaquin over the phone while we attempted to arrange a plan of care. However the patient called yesterday to report he had low-grade fever and he came in today acutely. There is been a marked deterioration in the left third toe with spreading cellulitis up into the dorsal left foot. He was referred to the emergency room. Readmission: 06/29/2020 patient presents today for reevaluation here in our clinic he was previously treated by Dr. Dellia Nims at the latter part of 2019 in 2 the  beginning of 2020. Subsequently we have not seen him since that time in the interim he did have evaluation with vein and vascular specialist specifically Dr. Anice Paganini who did perform quite extensive work for a left femoral to anterior tibial artery bypass. With that being said in the interim the patient has developed significant lymphedema and has wounds that he tells me have really never healed in  regard to the incision site on the left leg. He also has multiple wounds on the feet for various reasons some of which is that he tends to pick at his feet. Fortunately there is no signs of active infection systemically at this time he does have some wounds that are little bit deeper but most are fairly superficial he seems to have good blood flow and overall everything appears to be healthy I see no bone exposed and no obvious signs of osteomyelitis. I do not know that he necessarily needs a x-ray at this point although that something we could consider depending on how things progress. The patient does have a history of lymphedema, diabetes, this is type II, chronic kidney disease stage III, hypertension, and history of peripheral vascular disease. 07/05/2020; patient admitted last week. Is a patient I remember from 2019 he had a spreading infection involving the left foot and we sent him to the hospital. 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 He subsequently had a left femoral to anterior tibial bypass by Dr.[facility] vein and vascular. He also has severe lymphedema with chronic skin changes related to that on the left leg. The most problematic area that was new today was on the left medial great toe. This was apparently a small area last week there was purulent drainage which our intake nurse cultured. Also areas on the left medial foot and heel left lateral foot. He has 2 areas on the left medial calf left lateral calf in the setting of the severe  lymphedema. 07/13/2020 on evaluation today patient appears to be doing better in my opinion compared to his last visit. The good news is there is no signs of active infection systemically and locally I do not see any signs of infection either. He did have an x-ray which was negative that is great news he had a culture which showed MRSA but at the same time he is been on the doxycycline which has helped. I do think we may want to extend this for 7 additional days 1/25; patient admitted to the clinic a few weeks ago. 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 We have been putting him under compression his edema control is a lot better but he is severe verricused skin on the left leg. He is really done quite well he still has an open area on the left medial calf and the left medial first metatarsal head. We have been using silver collagen on the leg silver alginate on the foot 07/27/2020 upon evaluation today patient appears to be doing decently well in regard to his wounds. He still has a lot of dry skin on the left leg. Some of this is starting to peel back and I think he may be able to have them out by removing some that today. Fortunately there is no signs of active infection at this time on the left leg although on the right leg he does appear to have swelling and erythema as well as some mild warmth to touch. This does have been concerned about the possibility of cellulitis although within the differential diagnosis I do think that potentially a DVT has to be at least considered. We need to rule that out before proceeding would just call in the cellulitis. Especially since he is having pain in the posterior aspect of his calf muscle. 2/8; the patient had seen sparingly. He has severe skin changes  of chronic lymphedema in the left leg thickened hyperkeratotic verrucous skin. He has an open wound on the medial part of the left first met head left mid tibia. He  also has a rim of nonepithelialized skin in the anterior mid tibia. He brought in the AmLactin lotion that was been prescribed although I am not sure under compression and its utility. There concern about cellulitis on the right lower leg the last time he was here. He was put on on antibiotics. His DVT rule out was negative. The right leg looks fine he is using his stocking on this area 08/10/2020 upon evaluation today patient appears to be doing well with regard to his leg currently. He has been tolerating the dressing changes without complication. Fortunately there is no signs of active infection which is great news. Overall very pleased with where things stand. 2/22; the patient still has an area on the medial part of the left first met his head. This looks better than when I last saw this earlier this month he has a rim of epithelialization but still some surface debris. Mostly everything on the left leg is healed. There is still a vulnerable in the left mid tibia area. 08/30/2020 upon evaluation today patient appears to be doing much better in regard to his wounds on his foot. 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 Nonetheless I think we will address that today I Minna send in a prescription for him in that regard. Overall though there does not appear to be any signs of significant worsening. 09/07/2020 on evaluation today patient's wounds over his left foot appear to be doing excellent. I do not see any signs of infection there is some callus buildup this can require debridement for certain but overall I feel like he is managing quite nicely. He still using the AmLactin cream which has been beneficial for him as well. 3/22; left foot wound is closed. There is no open area here. He is using ammonium lactate lotion to the lower extremities to help exfoliate dry  cracked skin. He has compression stockings from elastic therapy in [place]. The wound on the medial part of his left first met head is healed today. READMISSION 04/12/2021 [name] is a patient we know fairly well he had a prolonged stay in clinic in 2019 with wounds on his left lateral and left anterior lower extremity in the setting of chronic venous insufficiency. More recently he was here earlier this year with predominantly an area on his left foot first metatarsal head plantar and he says the plantar foot broke down on its not long after we discharged him but he did not come back here. The last few months areas of broken down on his left anterior and again the left lateral lower extremity. The leg itself is very swollen chronically enlarged a lot of hyperkeratotic dry Berry Q skin in the left lower leg. His edema extends well into the thigh. He was seen by Dr. Donzetta Matters. He had ABIs on 03/02/2021 showing an ABI on the right of 1 with a TBI of 0.72 his ABI in the left at 1.09 TBI of 0.99. Monophasic and biphasic waveforms on the right. On the left monophasic waveforms were noted he went on to have an angiogram on 03/27/2021 this showed the aortic aortic and iliac segments were free of flow-limiting stenosis the left  common femoral vein to evaluate the left femoral to anterior tibial artery bypass was unobstructed the bypass was patent without any areas of stenosis. We discharged the patient in bilateral juxta lite stockings but very clearly that was not sufficient to control the swelling and maintain skin integrity. He is clearly going to need compression pumps. 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 This is fortunate because he is on his feet for long periods of time. 10/27; patient comes in with our intake nurse reporting copious amount of green drainage from the left anterior mid tibia the left dorsal foot and to a lesser extent the left  medial mid tibia. We left the compression wrap on all week for the amount of edema in his left leg is quite a bit better. We use silver alginate as the primary dressing 11/3; edema control is good. Left anterior lower leg left medial lower leg and the plantar first metatarsal head. The left anterior lower leg required debridement. Deep tissue culture I did of this wound showed MRSA I put him on 10 days of doxycycline which she will start today. We have him in compression wraps. He has a security card and [employer] however he is retiring on November 15. We will need to then get him into a better offloading boot for the left foot perhaps a total contact cast 11/10; edema control is quite good. Left anterior and left medial lower leg wounds in the setting of chronic venous insufficiency and lymphedema. He also has a substantial area over the left plantar first metatarsal head. 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 He has significant hypergranulation on the left plantar foot wound. The patient is a diabetic but he does not have significant PAD 11/17; edema control is quite good. Left anterior and left medial lower leg wounds look better. The really concerning area remains the area on the left plantar first metatarsal head. He has a rim of epithelialization. 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 Starting today with a forefoot off loader but . possibly a total contact cast. 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 Arrives with a very significant odor from I think all of the wounds on the left leg and the left foot. Because of this I did not put a total contact cast on him today but will  could still consider this. His wife was having cataract surgery which is the reason he missed the appointment 12/6. I saw this man 5 days ago with a swelling below the popliteal fossa. I thought he actually might have a Baker's cyst however the DVT rule out study that we could arrange right away was negative the technician told me this was not a ruptured Baker's cyst. We attempted to get this aspirated by under ultrasound guidance in interventional radiology however all they did was an ultrasound however it shows an extensive fluid collection 62 x 8 x 9.4 in the left thigh and left calf. The patient states he thinks this started 8 days ago or so but he really is not  complaining of any pain, fever or systemic symptoms. He has not ha 12/20; after some difficulty I managed to get the patient into see Dr. Donzetta Matters. Eventually he was taken into the hospital and had a drain put in the fluid collection below his left knee posteriorly extending into the posterior thigh. He still has the drain in place. Culture of this showed moderate staff aureus few Morganella and few Klebsiella he is now on doxycycline and ciprofloxacin as suggested by infectious disease he is on this for a month. The drain will remain in place until it stops draining 12/29; he comes in today with the 1 wound on his left leg and the area on the left plantar first met head significantly smaller. Both look healthy. He still has the drain in the left leg. He says he has to change this daily. Follows up with Dr. Donzetta Matters on January 11. 06/29/2021; the wounds that I am following on the left leg and left first met head continued to be quite healthy. However the area where his inferior drain is in place had copious amounts of drainage which was green in color. The wound here is larger. Follows up with Dr. Gwenlyn Saran of vein and vascular his surgeon next week as well as infectious disease. He remains on ciprofloxacin and doxycycline. He is not complaining of  excessive pain in either one of the drain areas 1/12; the patient saw vascular surgery and infectious disease. Vascular surgery has left the drain in place as there was still some notable drainage still see him back in 2 weeks. Dr. Velna Ochs stop the doxycycline and ciprofloxacin and I do not believe he follows up with them at this point. Culture I did last week showed both doxycycline resistant MRSA and Pseudomonas not sensitive to ciprofloxacin although only in rare titers 1/19; the patient's wound on the left anterior lower leg is just about healed. We have continued healing of the area that was medially on the left leg. Left first plantar metatarsal head continues to get smaller. The major problem here is his 2 drain sites 1 on the left upper calf and lateral thigh. There is purulent drainage still from the left lateral thigh. I gave him antibiotics last week but we still have recultured. He has the drain in the area I think this is eventually going to have to come out. I suspect there will be a connecting wound to heal here perhaps with improved VAc 1/26; the patient had his drain removed by vein and vascular on 1/25/. This was a large pocket of fluid in his left thigh that seem to tunnel into his left upper calf. 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 His mention his Penrose drain was removed today. He now has a tunneling wound on his left calf and left thigh. Both of these probe widely towards each other although I cannot really prove that they connect. Both wounds on his lower leg anteriorly are closed and his area over the first metatarsal head on his right foot continues to improve. We are using Hydrofera Blue here. He also saw infectious disease culture of the abscess they noted was polymicrobial with MRSA, Morganella and Klebsiella he was treated with doxycycline and ciprofloxacin for 4 weeks ending on 07/03/2021. They did not recommend any further antibiotics. Notable that  while he still had the Penrose drain in place last week he had purulent drainage coming out of the inferior IandD site this grew [facility], MRSA and Pseudomonas  but there does not appear to be any active infection in this area today with the drain out and he is not systemically unwell 2/2; with regards to the drain sites the superior one on the thigh actually is closed down the one on the upper left lateral calf measures about 8 and half centimeters which is an improvement seems to be less prominent although still with a lot of drainage. The only remaining wound is over the first metatarsal head on the left foot and this looks to be continuing to improve with Hydrofera Blue. 2/9; the area on his plantar left foot continues to contract. Callus around the wound edge. The drain sites specifically have not come down in depth. We put the wound VAC on Monday he changed the canister late last night our intake nurse reported a pocket of fluid perhaps caused by our compression wraps 2/16; continued improvement in left foot plantar wound. drainage site in the calf is not improved in terms of depth (wound vac) 2/23; continued improvement in the left foot wound over the first metatarsal head. With regards to the drain sites the area on his thigh laterally is healed however the open area on his calf is small in terms of circumference by still probes in by about 15 cm. Within using the wound VAC. Hydrofera Blue on his foot 08/24/2021: The left first metatarsal head wound continues to improve. The wound bed is healthy with just some surrounding callus. Unfortunately the open drain site on his calf remains open and tunnels at least 15 cm (the extent of a Q-tip). This is despite several weeks of wound VAC treatment. Based on reading back through the notes, there has been really no significant change in the depth of the wound, although the orifice is smaller and the more cranial wound on his thigh has closed. I  suspect the tunnel tracks nearly all the way to this location. 08/31/2021: Continued improvement in the left first metatarsal head wound. There has been absolutely no improvement to the long tunnel from his open drain site on his calf. We have tried to get him into see vascular surgery sooner to consider the possibility of simply filleting the tract open and allowing it to heal from the bottom up, likely with a wound VAC. They have not yet scheduled a sooner appointment than his current mid April 09/14/2021: He was seen by vascular surgery and they took him to the operating room last week. They opened a portion of the tunnel, but did not extend the entire length of the known open subcutaneous tract. I read Dr. Claretha Cooper operative note and it is not clear from that documentation why only a portion of the tract was opened. The heaped up granulation tissue was curetted and removed from at least some portion of the tract. They did place a wound VAC and applied an Unna boot to the leg. The ulcer on his left first metatarsal head is smaller today. The bed looks good and there is just a small amount of surrounding callus. 09/21/2021: The ulcer on his left first metatarsal head looks to be stalled. There is some callus surrounding the wound but the wound bed itself does not appear particularly dynamic. The tunnel tract on his lateral left leg seems to be roughly the same length or perhaps slightly smaller but the wound bed appears healthy with good granulation tissue. He opened up a new wound on his medial thigh and the site of a prior surgical incision. He says that  he did this unconsciously in his sleep by scratching. 09/28/2021: Unfortunately, the ulcer on his left first metatarsal head has extended underneath the callus toward the dorsum of his foot. The medial thigh wounds are roughly the same. The tunnel on his lateral left leg continues to be problematic; it is longer than we are able to actually probe with a  Q-tip. I am still not certain as to why Dr. Donzetta Matters did not open this up entirely when he took the patient to the operating room. We will likely be back in the same situation with just a small superficial opening in a long unhealed tract, as the open portion is granulating in nicely. 10/02/2021: The patient was initially scheduled for a nurse visit, but we are also applying a total contact cast today. The plantar foot wound looks clean without significant accumulated callus. We have been applying Prisma silver collagen to the site. 10/05/2021: The patient is here for his first total contact cast change. We have tried using gauze packing strips in the tunnel on his lateral leg wound, but this does not seem to be working any better than the white VAC foam. The foot ulcer looks about the same with minimal periwound callus. Medial thigh wound is clean with just some overlying eschar. 10/12/2021: The plantar foot wound is stable without any significant accumulation of periwound callus. The surface is viable with good granulation tissue. The medial thigh wounds are much smaller and are epithelializing. On the other hand, he had purulent drainage coming from the tunnel on his lateral leg. He does go back to see Dr. Donzetta Matters next week and is planning to ask him why the wound tunnel was not completely opened at the time of his most recent operation. 10/19/2021: The plantar foot wound is markedly improved and has epithelial tissue coming through the surface. The medial thigh wounds are nearly closed with just a tiny open area. He did see Dr. Donzetta Matters earlier this week and apparently they did discuss the possibility of opening the sinus tract further and enabling a wound VAC application. Apparently there are some limits as to what Dr. 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 I think if we could get the tract open to the level of the popliteal fossa, this would greatly aid in her ability to  get this chart closed. That being said, however, today when I probed the tract with a Q-tip, I was not able to insert the entirety of the Q-tip as I have on previous occasions. The tunnel is shorter by about 4 cm. The surface is clean with good granulation tissue and no further episodes of purulent drainage. 10/30/2021: Last week, the patient underwent surgery and had the long tract in his leg opened. There was a rind that was debrided, according to the operative report. His medial thigh ulcers are closed. The plantar foot wound is clean with a good surface and some built up surrounding callus. 11/06/2021: The overall dimensions of the large wound on his lateral leg remain about the same, but there is good granulation tissue present and the tunneling is a little bit shorter. He has a new wound on his anterior tibial surface, in the same location where he had a similar lesion in the past. The plantar foot wound is clean with some buildup surrounding callus. Just toward the medial aspect of his foot, however, there is an area of darkening that once debrided, revealed another opening in the skin surface. 11/13/2021: The anterior  tibial surface wound is closed. The plantar foot wound has some surrounding callus buildup. The area of darkening that I debrided last week and revealed an opening in the skin surface has closed again. The tunnel in the large wound on his lateral leg has come in by about 3 cm. There is healthy granulation tissue on the entire wound surface. 11/23/2021: The patient was out of town last week and did wet-to-dry dressings on his large wound. 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 Unfortunately, he picked open the wound on his left medial thigh. He says that it was itching and he just could not stop scratching it until it was open again. The wound on his plantar foot is smaller and has not accumulated a tremendous amount  of callus. The lateral leg wound is shallower and the tunnel has also decreased in depth. There is just a little bit of slough accumulation on the surface. 11/30/2021: Another portion of his left medial thigh has been opened up. All of these wounds are fairly superficial with just a little bit of slough and eschar accumulation. The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

## 2021-12-01 NOTE — Telephone Encounter (Signed)
Patient has been seen in office. Dr Donzetta Matters has reviewed pictures.

## 2021-12-04 ENCOUNTER — Encounter (HOSPITAL_BASED_OUTPATIENT_CLINIC_OR_DEPARTMENT_OTHER): Payer: Medicare Other | Admitting: General Surgery

## 2021-12-04 DIAGNOSIS — E11621 Type 2 diabetes mellitus with foot ulcer: Secondary | ICD-10-CM | POA: Diagnosis not present

## 2021-12-04 NOTE — Progress Notes (Signed)
EBENEZER, MCCASKEY ([id number]) Visit Report for 12/04/2021 SuperBill Details Patient Name: Date of Service: [name], PANAS 12/04/2021 Medical Record Number: [id number] Patient Account Number: [id number] Date of Birth/Sex: Treating RN: 03/22/1951 (72 y.o. [name] Primary Care Provider: [name] Other Clinician: Referring Provider: Treating Provider/Extender: [name] in Treatment: 33 Diagnosis Coding ICD-10 Codes Code Description E11.621 Type 2 diabetes mellitus with foot ulcer 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 I89.0 Lymphedema, not elsewhere classified I87.322 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verity 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

## 2021-12-04 NOTE — Progress Notes (Signed)
[name] ([id number]) Visit Report for 12/04/2021 Arrival Information Details Patient Name: Date of Service: [name], [name] 12/04/2021 11:15 A M Medical Record Number: [id number] Patient Account Number: [id number] Date of Birth/Sex: Treating RN: 01-24-1951 (71 y.o. [name] Primary Care [name]: [name] Other Clinician: Referring [name]: Treating [name]/Extender: [name] in Treatment: 52 Visit Information History Since Last Visit Added or deleted any medications: No Patient Arrived: Ambulatory Any new allergies or adverse reactions: No Arrival Time: 11:18 Had a fall or experienced change in No Accompanied By: self activities of daily living that may affect Transfer Assistance: None risk of falls: Patient Identification Verified: Yes Signs or symptoms of abuse/neglect since last visito No Secondary Verification Process Completed: Yes Hospitalized since last visit: No Patient Requires Transmission-Based Precautions: No Implantable device outside of the clinic excluding No Patient Has Alerts: Yes cellular tissue based products placed in the center since last visit: Has Dressing in Place as Prescribed: Yes Has Compression in Place as Prescribed: Yes Pain Present Now: No Electronic Signature(s) Signed: 12/04/2021 5:17:06 PM By: [name] Entered By: [name] on 12/04/2021 11:19:44 -------------------------------------------------------------------------------- Compression Therapy Details Patient Name: Date of Service: [name]. 12/04/2021 11:15 A M Medical Record Number: [id number] Patient Account Number: [id number] Date of Birth/Sex: Treating RN: Jan 31, 1951 (71 y.o. [name] Primary Care [name]: [name] Other Clinician: Referring Zemira [name]: Treating [name]/Extender: [name] in Treatment: 33 Compression Therapy Performed for Wound Assessment: Wound  #18 Left,Plantar Metatarsal head first Performed By: Clinician [name] Peals, RN Compression Type: Double Layer Electronic Signature(s) Signed: 12/04/2021 5:17:06 PM By: [name] By: [name] on 12/04/2021 11:56:08 -------------------------------------------------------------------------------- Encounter Discharge Information Details Patient Name: Date of Service: [name]. 12/04/2021 11:15 A M Medical Record Number: [id number] Patient Account Number: [id number] Date of Birth/Sex: Treating RN: 09-02-1950 (71 y.o. [name] Primary Care Bradin [name]: [name] Other Clinician: Referring [name]: Treating [name]/Extender: [name] in Treatment: 33 Encounter Discharge Information Items Discharge Condition: Stable Ambulatory Status: Ambulatory Discharge Destination: Home Transportation: Private Auto Accompanied By: self Schedule Follow-up Appointment: Yes Clinical Summary of Care: Patient Declined Electronic Signature(s) Signed: 12/04/2021 5:17:06 PM By: [name] Entered By: [name] on 12/04/2021 11:57:31 -------------------------------------------------------------------------------- Negative Pressure Wound Therapy Maintenance (NPWT) Details Patient Name: Date of Service: [name], [name] 12/04/2021 11:15 A M Medical Record Number: [id number] Patient Account Number: [id number] Date of Birth/Sex: Treating RN: Nov 01, 1950 (71 y.o. [name] Primary Care [name]: [name] Other Clinician: Referring [name]: Treating [name]/Extender: [name] in Treatment: 33 NPWT Maintenance Performed for: Wound #22 Left, Lateral Lower Leg Additional Injuries Covered: No Performed By: [name] Peals, RN Type: VAC System Coverage Size (sq cm): 40.5 Pressure Type: Constant Pressure Setting: 125 mmHG Drain Type: None Sponge/Dressing  Type: Combination : white and black Date Initiated: 07/27/2021 Dressing Removed: Yes Quantity of Sponges/Gauze Removed: 2 Canister Changed: No Dressing Reapplied: Yes Quantity of Sponges/Gauze Inserted: 2 Days On NPWT : 131 Electronic Signature(s) Signed: 12/04/2021 5:17:06 PM By: [name] Entered By: [name] on 12/04/2021 11:56:48 -------------------------------------------------------------------------------- Patient/Caregiver Education Details Patient Name: Date of Service: [name] 6/12/2023andnbsp11:15 A M Medical Record Number: [id number] Patient Account Number: [id number] Date of Birth/Gender: Treating RN: 03-Feb-1951 (71 y.o. [name] Primary Care Physician: [name] Other Clinician: Referring Physician: Treating Physician/Extender: [name] in Treatment: 67 Education Assessment Education Provided To: Patient Education Topics  Provided Wound/Skin Impairment: Methods: Explain/Verbal Responses: Reinforcements needed, State content correctly Electronic Signature(s) Signed: 12/04/2021 5:17:06 PM By: [name] Entered By: [name] on 12/04/2021 11:57:16 -------------------------------------------------------------------------------- Wound Assessment Details Patient Name: Date of Service: [name], [name] 12/04/2021 11:15 A M Medical Record Number: [id number] Patient Account Number: [id number] Date of Birth/Sex: Treating RN: 12/19/1950 (71 y.o. [name] Primary Care [name]: [name] Other Clinician: Referring [name]: Treating [name]/Extender: [name] in Treatment: 33 Wound Status Wound Number: 18 Primary Diabetic Wound/Ulcer of the Lower Extremity Etiology: Wound Location: Left, Plantar Metatarsal head first Wound Open Wounding Event: Gradually Appeared Status: Date Acquired: 08/23/2020 Comorbid Glaucoma, Sleep Apnea,  Hypertension, Peripheral Arterial Disease, Weeks Of Treatment: 33 History: Peripheral Venous Disease, Type II Diabetes, Gout, Osteoarthritis, Clustered Wound: No Neuropathy Wound Measurements Length: (cm) 0.8 Width: (cm) 1 Depth: (cm) 0.1 Area: (cm) 0.628 Volume: (cm) 0.063 % Reduction in Area: 95.6% % Reduction in Volume: 97.8% Epithelialization: Medium (34-66%) Wound Description Classification: Grade 2 Wound Margin: Distinct, outline attached Exudate Amount: Medium Exudate Type: Serosanguineous Exudate Color: red, brown Foul Odor After Cleansing: No Slough/Fibrino No Wound Bed Granulation Amount: Small (1-33%) Exposed Structure Granulation Quality: Red Fascia Exposed: No Necrotic Amount: Large (67-100%) Fat Layer (Subcutaneous Tissue) Exposed: Yes Necrotic Quality: Eschar Tendon Exposed: No Muscle Exposed: No Joint Exposed: No Bone Exposed: No Treatment Notes Wound #18 (Metatarsal head first) Wound Laterality: Plantar, Left Cleanser Soap and Water Discharge Instruction: May shower and wash wound with dial antibacterial soap and water prior to dressing change. Wound Cleanser Discharge Instruction: Cleanse the wound with wound cleanser prior to applying a clean dressing using gauze sponges, not tissue or cotton balls. Peri-Wound Care Triamcinolone 15 (g) Discharge Instruction: Use triamcinolone 15 (g) as directed Sween Lotion (Moisturizing lotion) Discharge Instruction: Apply moisturizing lotion as directed Topical Primary Dressing Promogran Prisma Matrix, 4.34 (sq in) (silver collagen) Discharge Instruction: Moisten collagen with saline or hydrogel Secondary Dressing Woven Gauze Sponge, Non-Sterile 4x4 in Discharge Instruction: Apply over primary dressing as directed. Secured With 77M Medipore H Soft Cloth Surgical T ape, 4 x 10 (in/yd) Discharge Instruction: Secure with tape as directed. Compression Wrap CoFlex TLC XL 2-layer Compression System 4x7  (in/yd) Discharge Instruction: Apply CoFlex 2-layer compression as directed. (alt for 4 layer) Compression Stockings Add-Ons Electronic Signature(s) Signed: 12/04/2021 5:17:06 PM By: [name] Entered By: [name] on 12/04/2021 11:20:18 -------------------------------------------------------------------------------- Wound Assessment Details Patient Name: Date of Service: [name], [name] 12/04/2021 11:15 A M Medical Record Number: [id number] Patient Account Number: [id number] Date of Birth/Sex: Treating RN: Feb 09, 1951 (71 y.o. [name] Primary Care Lamount Bankson: [name] Other Clinician: Referring Jaylenn Altier: Treating Anthem Frazer/Extender: [name] in Treatment: 33 Wound Status Wound Number: 22 Primary Cyst Etiology: Wound Location: Left, Lateral Lower Leg Wound Open Wounding Event: Bump Status: Date Acquired: 06/03/2021 Comorbid Glaucoma, Sleep Apnea, Hypertension, Peripheral Arterial Disease, Weeks Of Treatment: 26 History: Peripheral Venous Disease, Type II Diabetes, Gout, Osteoarthritis, Clustered Wound: No Neuropathy Wound Measurements Length: (cm) 13.5 Width: (cm) 3 Depth: (cm) 0.1 Area: (cm) 31.809 Volume: (cm) 3.181 % Reduction in Area: -1829% % Reduction in Volume: -141.2% Epithelialization: Small (1-33%) Wound Description Classification: Full Thickness With Exposed Support Structures Wound Margin: Distinct, outline attached Exudate Amount: Large Exudate Type: Serosanguineous Exudate Color: red, brown Foul Odor After Cleansing: No Slough/Fibrino No Wound Bed Granulation Amount: Large (67-100%) Exposed Structure Granulation Quality: Red Fascia Exposed: No Necrotic Amount: None Present (0%) Fat Layer (Subcutaneous Tissue) Exposed:  Yes Tendon Exposed: No Muscle Exposed: No Joint Exposed: No Bone Exposed: No Treatment Notes Wound #22 (Lower Leg) Wound Laterality: Left, Lateral Cleanser Soap and  Water Discharge Instruction: May shower and wash wound with dial antibacterial soap and water prior to dressing change. Wound Cleanser Discharge Instruction: Cleanse the wound with wound cleanser prior to applying a clean dressing using gauze sponges, not tissue or cotton balls. Peri-Wound Care Topical Primary Dressing VAC Secondary Dressing Secured With Compression Wrap Compression Stockings Add-Ons Electronic Signature(s) Signed: 12/04/2021 5:17:06 PM By: [name] Entered By: [name] on 12/04/2021 11:20:24 -------------------------------------------------------------------------------- Wound Assessment Details Patient Name: Date of Service: [name]. 12/04/2021 11:15 A M Medical Record Number: [id number] Patient Account Number: [id number] Date of Birth/Sex: Treating RN: [birth year] (71 y.o. [name] Primary Care Kenyata Guess: [name] Other Clinician: Referring Vicie Cech: Treating Charletha Dalpe/Extender: [name] in Treatment: 33 Wound Status Wound Number: 24R Primary Abrasion Etiology: Wound Location: Left, Medial Upper Leg Wound Open Wounding Event: Trauma Status: Date Acquired: 09/14/2021 Comorbid Glaucoma, Sleep Apnea, Hypertension, Peripheral Arterial Disease, Weeks Of Treatment: 10 History: Peripheral Venous Disease, Type II Diabetes, Gout, Osteoarthritis, Clustered Wound: Yes Neuropathy Wound Measurements Length: (cm) 8.5 Width: (cm) 0.6 Depth: (cm) 0.1 Clustered Quantity: 3 Area: (cm) 4.006 Volume: (cm) 0.401 % Reduction in Area: -142.9% % Reduction in Volume: -143% Epithelialization: Small (1-33%) Wound Description Classification: Full Thickness Without Exposed Support Structures Wound Margin: Distinct, outline attached Exudate Amount: Medium Exudate Type: Serosanguineous Exudate Color: red, brown Foul Odor After Cleansing: No Slough/Fibrino No Wound Bed Granulation Amount: Large  (67-100%) Exposed Structure Granulation Quality: Red Fascia Exposed: No Necrotic Amount: Small (1-33%) Fat Layer (Subcutaneous Tissue) Exposed: Yes Necrotic Quality: Eschar Tendon Exposed: No Muscle Exposed: No Joint Exposed: No Bone Exposed: No Treatment Notes Wound #24R (Upper Leg) Wound Laterality: Left, Medial Cleanser Wound Cleanser Discharge Instruction: Cleanse the wound with wound cleanser prior to applying a clean dressing using gauze sponges, not tissue or cotton balls. Peri-Wound Care Sween Lotion (Moisturizing lotion) Discharge Instruction: Apply moisturizing lotion as directed Topical Primary Dressing KerraCel Ag Gelling Fiber Dressing, 2x2 in (silver alginate) Discharge Instruction: Apply silver alginate to wound bed as instructed Secondary Dressing Bordered Gauze, 4x4 in Discharge Instruction: Apply over primary dressing as directed. Secured With Elastic Bandage 4 inch (ACE bandage) Discharge Instruction: Secure with ACE bandage as directed. Compression Wrap Compression Stockings Add-Ons Electronic Signature(s) Signed: 12/04/2021 5:17:06 PM By: [name] Entered By: [name] on 12/04/2021 11:20:31 -------------------------------------------------------------------------------- Vitals Details Patient Name: Date of Service: [name]. 12/04/2021 11:15 A M Medical Record Number: [id number] Patient Account Number: [id number] Date of Birth/Sex: Treating RN: 08-25-1950 (71 y.o. [name] Primary Care Ysabelle Goodroe: [name] Other Clinician: Referring Anastasia Tompson: Treating Yeray Tomas/Extender: [name] in Treatment: 33 Vital Signs Time Taken: 11:22 Temperature (F): 98.4 Height (in): 74 Pulse (bpm): 66 Weight (lbs): 238 Respiratory Rate (breaths/min): 18 Body Mass Index (BMI): 30.6 Blood Pressure (mmHg): 150/74 Reference Range: 80 - 120 mg / dl Electronic Signature(s) Signed: 12/04/2021 5:17:06  PM By: [name] Entered By: [name] on 12/04/2021 11:22:50

## 2021-12-07 ENCOUNTER — Encounter (HOSPITAL_BASED_OUTPATIENT_CLINIC_OR_DEPARTMENT_OTHER): Payer: Medicare Other | Admitting: General Surgery

## 2021-12-07 DIAGNOSIS — E11621 Type 2 diabetes mellitus with foot ulcer: Secondary | ICD-10-CM | POA: Diagnosis not present

## 2021-12-08 NOTE — Progress Notes (Signed)
[name], [name] ([id number]) Visit Report for 12/07/2021 Arrival Information Details Patient Name: Date of Service: [name], [name] 12/07/2021 10:45 A M Medical Record Number: [id number] Patient Account Number: [id number] Date of Birth/Sex: Treating RN: [birth year] (71 y.o. [name] Primary Care [name]: [name] Other Clinician: Referring [name]: Treating [name]/Extender: [name] in Treatment: 52 Visit Information History Since Last Visit Added or deleted any medications: No Patient Arrived: Ambulatory Any new allergies or adverse reactions: No Arrival Time: 11:57 Had a fall or experienced change in No Accompanied By: alone activities of daily living that may affect Transfer Assistance: None risk of falls: Patient Identification Verified: Yes Signs or symptoms of abuse/neglect since last visito No Secondary Verification Process Completed: Yes Hospitalized since last visit: No Patient Requires Transmission-Based Precautions: No Implantable device outside of the clinic excluding No Patient Has Alerts: Yes cellular tissue based products placed in the center since last visit: Has Dressing in Place as Prescribed: Yes Has Compression in Place as Prescribed: Yes Pain Present Now: No Electronic Signature(s) Signed: 12/07/2021 5:36:06 PM By: [name] Hurst RN, BSN Entered By: [name] on 12/07/2021 11:58:18 -------------------------------------------------------------------------------- Compression Therapy Details Patient Name: Date of Service: [name]. 12/07/2021 10:45 A M Medical Record Number: [id number] Patient Account Number: [id number] Date of Birth/Sex: Treating RN: 04-15-1951 (71 y.o. [name] Primary Care Ethin Drummond: [name] Other Clinician: Referring Keyarah Mcroy: Treating Evona Westra/Extender: [name] in Treatment: 34 Compression Therapy Performed for Wound Assessment: Wound #22  Left,Lateral Lower Leg Performed By: Clinician [name] Hurst, RN Compression Type: Double Layer Post Procedure Diagnosis Same as Pre-procedure Electronic Signature(s) Signed: 12/07/2021 5:36:06 PM By: [name] Hurst RN, BSN Entered By: [name] on 12/07/2021 12:14:36 -------------------------------------------------------------------------------- Compression Therapy Details Patient Name: Date of Service: [name]. 12/07/2021 10:45 A M Medical Record Number: [id number] Patient Account Number: [id number] Date of Birth/Sex: Treating RN: 05/18/1951 (71 y.o. [name] Primary Care Shaka Zech: [name] Other Clinician: Referring India Jolin: Treating Hitomi Slape/Extender: [name] in Treatment: 34 Compression Therapy Performed for Wound Assessment: Wound #18 Left,Plantar Metatarsal head first Performed By: Clinician [name] Hurst, RN Compression Type: Double Layer Post Procedure Diagnosis Same as Pre-procedure Electronic Signature(s) Signed: 12/07/2021 5:36:06 PM By: [name] Hurst RN, BSN Entered By: [name] on 12/07/2021 12:14:36 -------------------------------------------------------------------------------- Encounter Discharge Information Details Patient Name: Date of Service: [name]. 12/07/2021 10:45 A M Medical Record Number: [id number] Patient Account Number: [id number] Date of Birth/Sex: Treating RN: 06-Nov-1950 (71 y.o. [name] Primary Care Shakeeta Godette: [name] Other Clinician: Referring Ishana Blades: Treating Freman Lapage/Extender: [name] in Treatment: 20 Encounter Discharge Information Items Post Procedure Vitals Discharge Condition: Stable Temperature (F): 98.2 Ambulatory Status: Ambulatory Pulse (bpm): 57 Discharge Destination: Home Respiratory Rate (breaths/min): 18 Transportation: Private Auto Blood Pressure (mmHg): 132/78 Accompanied By: alone Schedule Follow-up  Appointment: Yes Clinical Summary of Care: Patient Declined Electronic Signature(s) Signed: 12/07/2021 5:36:06 PM By: [name] Hurst RN, BSN Entered By: [name] on 12/07/2021 17:21:34 -------------------------------------------------------------------------------- Lower Extremity Assessment Details Patient Name: Date of Service: [name]. 12/07/2021 10:45 A M Medical Record Number: [id number] Patient Account Number: [id number] Date of Birth/Sex: Treating RN: 1951-05-05 (71 y.o. [name] Primary Care Jearline Hirschhorn: [name] Other Clinician: Referring Teliah Buffalo: Treating Layali Freund/Extender: [name] in Treatment: 34 Edema Assessment Assessed: [name]: No] [name]: No] Edema: [Left: Ye] [Right: s] Calf Left: Right: Point of Measurement: 41 cm From Medial Instep  43.4 cm Ankle Left: Right: Point of Measurement: 10 cm From Medial Instep 28 cm Vascular Assessment Pulses: Dorsalis Pedis Palpable: [Left:Yes] Electronic Signature(s) Signed: 12/07/2021 5:36:06 PM By: [name] Hurst RN, BSN Entered By: [name] on 12/07/2021 12:00:00 -------------------------------------------------------------------------------- Multi Wound Chart Details Patient Name: Date of Service: [name]. 12/07/2021 10:45 A M Medical Record Number: [id number] Patient Account Number: [id number] Date of Birth/Sex: Treating RN: 10-Apr-1951 (71 y.o. 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 Primary Etiology: Extremity Glaucoma, Sleep Apnea, Glaucoma, Sleep Apnea, Glaucoma, Sleep Apnea, Comorbid History: Hypertension, Peripheral Arterial Hypertension, Peripheral Arterial Hypertension, Peripheral Arterial Disease, Peripheral Venous Disease, Disease, Peripheral Venous Disease, Disease, Peripheral Venous Disease, Type II Diabetes, Gout, Osteoarthritis, Type II Diabetes, Gout, Osteoarthritis, Type II Diabetes, Gout, Osteoarthritis, Neuropathy Neuropathy Neuropathy 08/23/2020 06/03/2021 09/14/2021 Date Acquired: 34 26 11 Weeks of Treatment: Open Open Open Wound Status: No No Yes Wound Recurrence: No No Yes Clustered Wound: N/A N/A 3 Clustered Quantity: 0.2x0.2x0.1 11x3.4x0.2 0.5x0.6x0.1 Measurements L x W x D (cm) 0.031 29.374 0.236 A (cm) : rea 0.003 5.875 0.024 Volume (cm) : 99.80% -1681.30% 85.70% % Reduction in A rea: 99.90% -345.40% 85.50% % Reduction in Volume: 3 Position 1 (o'clock): 3.2 Maximum Distance 1 (cm): No Yes No Tunneling: Grade 2 Full Thickness With Exposed Support Full Thickness Without Exposed Classification: Structures Support Structures Small Large Medium Exudate A mount: Serosanguineous Serosanguineous Serosanguineous Exudate Type: red, brown red, brown red, brown Exudate Color: Thickened Distinct, outline attached Distinct, outline attached Wound Margin: Large (67-100%) Large (67-100%) Large (67-100%) Granulation A mount: Red Red, Pink Red, Hyper-granulation Granulation Quality: Small (1-33%) Small (1-33%) Small (1-33%) Necrotic A mount: Eschar Adherent Slough Eschar Necrotic Tissue: Fat Layer (Subcutaneous Tissue): Yes Fat Layer (Subcutaneous Tissue): Yes Fat Layer (Subcutaneous Tissue): Yes Exposed Structures: Fascia: No Fascia: No Fascia: No Tendon: No Tendon: No Tendon: No Muscle: No Muscle: No Muscle: No Joint: No Joint: No Joint:  No Bone: No Bone: No Bone: No Medium (34-66%) Small (1-33%) Medium (34-66%) Epithelialization: Debridement - Selective/Open Wound N/A N/A Debridement: Pre-procedure Verification/Time Out 12:07 N/A N/A Taken: Necrotic/Eschar, Callus N/A N/A Tissue Debrided: Non-Viable Tissue N/A N/A Level: 1 N/A N/A Debridement A (sq cm): rea Curette N/A N/A Instrument: Minimum N/A N/A Bleeding: Pressure N/A N/A Hemostasis A chieved: 0 N/A N/A Procedural Pain: 0 N/A N/A Post Procedural Pain: Procedure was tolerated well N/A N/A Debridement Treatment Response: 0.2x0.2x0.1 N/A N/A Post Debridement Measurements L x W x D (cm) 0.003 N/A N/A Post Debridement Volume: (cm) Compression Therapy Compression Therapy N/A Procedures Performed: Debridement Negative Pressure Wound Therapy Maintenance (NPWT) Treatment Notes Electronic Signature(s) Signed: 12/07/2021 12:38:51 PM By: Fredirick Maudlin MD FACS Signed: 12/08/2021 5:15:13 PM By: Adline Peals Entered By: Fredirick Maudlin on 12/07/2021 12:38:50 -------------------------------------------------------------------------------- Multi-Disciplinary Care Plan Details Patient Name: Date of Service: [name]. 12/07/2021 10:45 A M Medical Record Number: [id number] Patient Account Number: [id number] Date of Birth/Sex: Treating RN: August 20, 1950 (71 y.o. [name] Primary Care Archit Leger: [name] Other Clinician: Referring Rui Wordell: Treating Asley Baskerville/Extender: [name] in Treatment: [place]  reviewed with physician Active Inactive Venous Leg Ulcer Nursing Diagnoses: Knowledge deficit related to disease process and management Potential for venous Insuffiency (use before diagnosis confirmed) Goals: Patient will maintain optimal edema control Date Initiated: 07/27/2021 Target Resolution Date: 12/14/2021 Goal Status: Active Interventions: Assess peripheral edema status every  visit. Treatment Activities: Therapeutic compression applied : 07/27/2021 Notes: Wound/Skin Impairment Nursing Diagnoses: Impaired tissue integrity Knowledge deficit related to ulceration/compromised skin integrity Goals: Patient will have a decrease in wound volume by X% from date: (specify in notes) Date Initiated: 04/12/2021 Date Inactivated: 04/27/2021 Target Resolution Date: 04/23/2021 Goal Status: Met Patient/caregiver will verbalize understanding of skin care regimen Date Initiated: 04/12/2021 Target Resolution Date: 12/14/2021 Goal Status: Active Ulcer/skin breakdown will have a volume reduction of 30% by week 4 Date Initiated: 04/12/2021 Date Inactivated: 04/27/2021 Target Resolution Date: 04/27/2021 Goal Status: Unmet Unmet Reason: infection Ulcer/skin breakdown will have a volume reduction of 50% by week 8 Date Initiated: 04/27/2021 Date Inactivated: 06/29/2021 Target Resolution Date: 06/24/2021 Goal Status: Met Interventions: Assess patient/caregiver ability to obtain necessary supplies Assess patient/caregiver ability to perform ulcer/skin care regimen upon admission and as needed Assess ulceration(s) every visit Notes: Electronic Signature(s) Signed: 12/07/2021 5:36:06 PM By: [name] Hurst RN, BSN Entered By: [name] on 12/07/2021 17:20:22 -------------------------------------------------------------------------------- Negative Pressure Wound Therapy Maintenance (NPWT) Details Patient Name: Date of Service: [name], [name] 12/07/2021 10:45 A M Medical Record Number: [id number] Patient Account Number: [id number] Date of Birth/Sex: Treating RN: [birth year] (71 y.o. [name] Primary Care Jennesis [name]: [name] Other Clinician: Referring Eustacia Urbanek: Treating Glendene Wyer/Extender: [name] in Treatment: 34 NPWT Maintenance Performed for: Wound #22 Left, Lateral Lower Leg Additional Injuries Covered: No Performed By: [name] Hurst, RN Type: VAC System Coverage Size (sq cm): 37.4 Pressure Type: Constant Pressure Setting: 125 mmHG Drain Type: None Sponge/Dressing Type: Combination : white and black foam Date Initiated: 07/27/2021 Dressing Removed: Yes Quantity of Sponges/Gauze Removed: 1 piece white foam, 1 piece black foam removed Canister Changed: No Dressing Reapplied: Yes Quantity of Sponges/Gauze Inserted: 1 piece white foam, 1 piece black foam Respones T Treatment: o pt tolerated well Days On NPWT : 134 Post Procedure Diagnosis Same as Pre-procedure Electronic Signature(s) Signed: 12/07/2021 5:36:06 PM By: [name] Hurst RN, BSN Entered By: [name] on 12/07/2021 12:14:21 -------------------------------------------------------------------------------- Pain Assessment Details Patient Name: Date of Service: [name]. 12/07/2021 10:45 A M Medical Record Number: [id number] Patient Account Number: [id number] Date of Birth/Sex: Treating RN: 1950-12-08 (71 y.o. [name] Primary Care Lizann Edelman: [name] Other Clinician: Referring Deserai Cansler: Treating Muneer Leider/Extender: [name] in Treatment: 34 Active Problems Location of Pain Severity and Description of Pain Patient Has Paino No Site Locations Pain Management and Medication Current Pain Management: Electronic Signature(s) Signed: 12/07/2021 5:36:06 PM By: [name] Hurst RN, BSN Entered By: [name] on 12/07/2021 11:59:45 -------------------------------------------------------------------------------- Patient/Caregiver Education Details Patient Name: Date of Service: [name] 6/15/2023andnbsp10:45 A M Medical Record Number: [id number] Patient Account Number: [id number] Date of Birth/Gender: Treating RN: [birth year] (71 y.o. [name] Primary Care Physician: [name] Other Clinician: Referring Physician: Treating Physician/Extender: [name] in Treatment: 70 Education Assessment Education Provided To: Patient Education Topics Provided Wound/Skin Impairment: Methods: Explain/Verbal Responses: State content correctly [employer]) Signed: 12/07/2021 5:36:06 PM By: [name] Hurst RN, BSN Entered By: [name] on 12/07/2021 17:20:35 -------------------------------------------------------------------------------- Wound Assessment Details Patient Name: Date of Service: [name]. 12/07/2021 10:45 A M Medical Record  Number: [id number] Patient Account Number: [id number] Date of Birth/Sex: Treating RN: 03/19/1951 (71 y.o. [name] Primary Care Janayah Zavada: [name] Other Clinician: Referring Colleene Swarthout: Treating Ronae Noell/Extender: [name] in Treatment: 34 Wound Status Wound Number: 18 Primary Diabetic Wound/Ulcer of the Lower Extremity Etiology: Wound Location: Left, Plantar Metatarsal head first Wound Open Wounding Event: Gradually Appeared Status: Date Acquired: 08/23/2020 Comorbid Glaucoma, Sleep Apnea, Hypertension, Peripheral Arterial Disease, Weeks Of Treatment: 34 History: Peripheral Venous Disease, Type II Diabetes, Gout, Osteoarthritis, Clustered Wound: No Neuropathy Photos Wound Measurements Length: (cm) 0.2 Width: (cm) 0.2 Depth: (cm) 0.1 Area: (cm) 0.031 Volume: (cm) 0.003 % Reduction in Area: 99.8% % Reduction in Volume: 99.9% Epithelialization: Medium (34-66%) Tunneling: No Undermining: No Wound Description Classification: Grade 2 Wound Margin: Thickened Exudate Amount: Small Exudate Type: Serosanguineous Exudate Color: red, brown Foul Odor After Cleansing: No Slough/Fibrino No Wound Bed Granulation Amount: Large (67-100%) Exposed Structure Granulation Quality: Red Fascia Exposed: No Necrotic Amount: Small (1-33%) Fat Layer (Subcutaneous Tissue) Exposed: Yes Necrotic Quality: Eschar Tendon Exposed: No Muscle Exposed:  No Joint Exposed: No Bone Exposed: No Treatment Notes Wound #18 (Metatarsal head first) Wound Laterality: Plantar, Left Cleanser Soap and Water Discharge Instruction: May shower and wash wound with dial antibacterial soap and water prior to dressing change. Wound Cleanser Discharge Instruction: Cleanse the wound with wound cleanser prior to applying a clean dressing using gauze sponges, not tissue or cotton balls. Peri-Wound Care Triamcinolone 15 (g) Discharge Instruction: Use triamcinolone 15 (g) as directed Sween Lotion (Moisturizing lotion) Discharge Instruction: Apply moisturizing lotion as directed Topical Primary Dressing Promogran Prisma Matrix, 4.34 (sq in) (silver collagen) Discharge Instruction: Moisten collagen with saline or hydrogel Secondary Dressing Woven Gauze Sponge, Non-Sterile 4x4 in Discharge Instruction: Apply over primary dressing as directed. Secured With 31M Medipore H Soft Cloth Surgical T ape, 4 x 10 (in/yd) Discharge Instruction: Secure with tape as directed. Compression Wrap CoFlex TLC XL 2-layer Compression System 4x7 (in/yd) Discharge Instruction: Apply CoFlex 2-layer compression as directed. (alt for 4 layer) Compression Stockings Add-Ons Electronic Signature(s) Signed: 12/07/2021 5:36:06 PM By: [name] Hurst RN, BSN Entered By: [name] on 12/07/2021 12:03:15 -------------------------------------------------------------------------------- Wound Assessment Details Patient Name: Date of Service: [name]. 12/07/2021 10:45 A M Medical Record Number: [id number] Patient Account Number: [id number] Date of Birth/Sex: Treating RN: 1950/12/25 (71 y.o. [name] Primary Care Domnique Vantine: [name] Other Clinician: Referring Jona Zappone: Treating Joanne Salah/Extender: [name] in Treatment: 34 Wound Status Wound Number: 22 Primary Cyst Etiology: Wound Location: Left, Lateral Lower Leg Wound Open Wounding  Event: Bump Status: Date Acquired: 06/03/2021 Comorbid Glaucoma, Sleep Apnea, Hypertension, Peripheral Arterial Disease, Weeks Of Treatment: 26 History: Peripheral Venous Disease, Type II Diabetes, Gout, Osteoarthritis, Clustered Wound: No Neuropathy Photos Wound Measurements Length: (cm) 11 Width: (cm) 3.4 Depth: (cm) 0.2 Area: (cm) 29.374 Volume: (cm) 5.875 % Reduction in Area: -1681.3% % Reduction in Volume: -345.4% Epithelialization: Small (1-33%) Tunneling: Yes Position (o'clock): 3 Maximum Distance: (cm) 3.2 Undermining: No Wound Description Classification: Full Thickness With Exposed Support Structures Wound Margin: Distinct, outline attached Exudate Amount: Large Exudate Type: Serosanguineous Exudate Color: red, brown Foul Odor After Cleansing: No Slough/Fibrino Yes Wound Bed Granulation Amount: Large (67-100%) Exposed Structure Granulation Quality: Red, Pink Fascia Exposed: No Necrotic Amount: Small (1-33%) Fat Layer (Subcutaneous Tissue) Exposed: Yes Necrotic Quality: Adherent Slough Tendon Exposed: No Muscle Exposed: No Joint Exposed: No Bone Exposed: No Treatment Notes Wound #22 (Lower Leg) Wound Laterality: Left, Lateral Cleanser Soap  and Water Discharge Instruction: May shower and wash wound with dial antibacterial soap and water prior to dressing change. Wound Cleanser Discharge Instruction: Cleanse the wound with wound cleanser prior to applying a clean dressing using gauze sponges, not tissue or cotton balls. Peri-Wound Care Topical Primary Dressing VAC Secondary Dressing Secured With Compression Wrap Compression Stockings Add-Ons Electronic Signature(s) Signed: 12/07/2021 5:36:06 PM By: [name] Hurst RN, BSN Entered By: [name] on 12/07/2021 12:04:22 -------------------------------------------------------------------------------- Wound Assessment Details Patient Name: Date of Service: [name]. 12/07/2021 10:45 A  M Medical Record Number: [id number] Patient Account Number: [id number] Date of Birth/Sex: Treating RN: [birth year] (71 y.o. [name] Primary Care Lisamarie Coke: [name] Other Clinician: Referring Perrin Gens: Treating Imogine Carvell/Extender: [name] in Treatment: 34 Wound Status Wound Number: 24R Primary Abrasion Etiology: Wound Location: Left, Medial Upper Leg Wound Open Wounding Event: Trauma Status: Date Acquired: 09/14/2021 Comorbid Glaucoma, Sleep Apnea, Hypertension, Peripheral Arterial Disease, Weeks Of Treatment: 11 History: Peripheral Venous Disease, Type II Diabetes, Gout, Osteoarthritis, Clustered Wound: Yes Neuropathy Photos Wound Measurements Length: (cm) 0.5 Width: (cm) 0.6 Depth: (cm) 0.1 Clustered Quantity: 3 Area: (cm) 0.236 Volume: (cm) 0.024 % Reduction in Area: 85.7% % Reduction in Volume: 85.5% Epithelialization: Medium (34-66%) Tunneling: No Undermining: No Wound Description Classification: Full Thickness Without Exposed Support Structures Wound Margin: Distinct, outline attached Exudate Amount: Medium Exudate Type: Serosanguineous Exudate Color: red, brown Foul Odor After Cleansing: No Slough/Fibrino Yes Wound Bed Granulation Amount: Large (67-100%) Exposed Structure Granulation Quality: Red, Hyper-granulation Fascia Exposed: No Necrotic Amount: Small (1-33%) Fat Layer (Subcutaneous Tissue) Exposed: Yes Necrotic Quality: Eschar Tendon Exposed: No Muscle Exposed: No Joint Exposed: No Bone Exposed: No Treatment Notes Wound #24R (Upper Leg) Wound Laterality: Left, Medial Cleanser Wound Cleanser Discharge Instruction: Cleanse the wound with wound cleanser prior to applying a clean dressing using gauze sponges, not tissue or cotton balls. Peri-Wound Care Sween Lotion (Moisturizing lotion) Discharge Instruction: Apply moisturizing lotion as directed Topical Primary Dressing KerraCel Ag Gelling Fiber Dressing,  2x2 in (silver alginate) Discharge Instruction: Apply silver alginate to wound bed as instructed Secondary Dressing Bordered Gauze, 4x4 in Discharge Instruction: Apply over primary dressing as directed. Secured With Elastic Bandage 4 inch (ACE bandage) Discharge Instruction: Secure with ACE bandage as directed. Compression Wrap Compression Stockings Add-Ons Electronic Signature(s) Signed: 12/07/2021 5:36:06 PM By: [name] Hurst RN, BSN Entered By: [name] on 12/07/2021 12:03:56 -------------------------------------------------------------------------------- Vitals Details Patient Name: Date of Service: [name]. 12/07/2021 10:45 A M Medical Record Number: [id number] Patient Account Number: [id number] Date of Birth/Sex: Treating RN: 1950-11-17 (71 y.o. [name] Primary Care Gelisa Tieken: [name] Other Clinician: Referring Jaleel Allen: Treating Drishti Pepperman/Extender: [name] in Treatment: 34 Vital Signs Time Taken: 11:57 Temperature (F): 98.2 Height (in): 74 Pulse (bpm): 57 Weight (lbs): 238 Respiratory Rate (breaths/min): 18 Body Mass Index (BMI): 30.6 Blood Pressure (mmHg): 132/78 Capillary Blood Glucose (mg/dl): 180 Reference Range: 80 - 120 mg / dl Notes glucose per pt report this AM Electronic Signature(s) Signed: 12/07/2021 5:36:06 PM By: [name] Hurst RN, BSN Entered By: [name] on 12/07/2021 11:58:57

## 2021-12-08 NOTE — Progress Notes (Signed)
[name], [name] ([id number]) Visit Report for 12/07/2021 Chief Complaint Document Details Patient Name: Date of Service: [name] 12/07/2021 10:45 A M Medical Record Number: [id number] Patient Account Number: [id number] Date of Birth/Sex: Treating RN: 08-Jun-1951 (71 y.o. [name] Primary Care Provider: [name] Other Clinician: Referring Provider: Treating Provider/Extender: [name] in Treatment: 34 Information Obtained from: Patient Chief Complaint Left leg and foot ulcers 04/12/2021; patient is here for wounds on his left lower leg and left plantar foot over the first metatarsal head Electronic Signature(s) Signed: 12/07/2021 12:38:59 PM By: Fredirick Maudlin MD FACS Entered By: Fredirick Maudlin on 12/07/2021 12:38:58 -------------------------------------------------------------------------------- Debridement Details Patient Name: Date of Service: [name]. 12/07/2021 10:45 A M Medical Record Number: [id number] Patient Account Number: [id number] Date of Birth/Sex: Treating RN: [birth year] (71 y.o. [name] Primary Care Provider: [name] Other Clinician: Referring Provider: Treating Provider/Extender: [name] in Treatment: 34 Debridement Performed for Assessment: Wound #18 Left,Plantar Metatarsal head first Performed By: Physician Fredirick Maudlin, MD Debridement Type: Debridement Severity of Tissue Pre Debridement: Fat layer exposed Level of Consciousness (Pre-procedure): Awake and Alert Pre-procedure Verification/Time Out Yes - 12:07 Taken: Start Time: 12:07 T Area Debrided (L x W): otal 1 (cm) x 1 (cm) = 1 (cm) Tissue and other material debrided: Non-Viable, Callus, Eschar Level: Non-Viable Tissue Debridement Description: Selective/Open Wound Instrument: Curette Bleeding: Minimum Hemostasis Achieved: Pressure End Time: 12:08 Procedural Pain: 0 Post Procedural Pain:  0 Response to Treatment: Procedure was tolerated well Level of Consciousness (Post- Awake and Alert procedure): Post Debridement Measurements of Total Wound Length: (cm) 0.2 Width: (cm) 0.2 Depth: (cm) 0.1 Volume: (cm) 0.003 Character of Wound/Ulcer Post Debridement: Improved Severity of Tissue Post Debridement: Fat layer exposed Post Procedure Diagnosis Same as Pre-procedure Electronic Signature(s) Signed: 12/07/2021 1:33:22 PM By: Fredirick Maudlin MD FACS Signed: 12/07/2021 5:36:06 PM By: Levan Hurst RN, BSN Entered By: Levan Hurst on 12/07/2021 12:08:18 -------------------------------------------------------------------------------- HPI Details Patient Name: Date of Service: [name]. 12/07/2021 10:45 A M Medical Record Number: [id number] Patient Account Number: [id number] Date of Birth/Sex: Treating RN: 03/24/1951 (71 y.o. [name] Primary Care Provider: [name] Other Clinician: Referring Provider: Treating Provider/Extender: [name] in Treatment: 34 History of Present Illness HPI Description: 10/11/17; [name] is a 71 year old man who tells me that in 2015 he slipped down the latter traumatizing his left leg. He developed a wound in the same spot the area that we are currently looking at. He states this closed over for the most part although he always felt it was somewhat unstable. In 2016 he hit the same area with the door of his car had this reopened. He tells me that this is never really closed although sometimes an inflow it remains open on a constant basis. He has not been using any specific dressing to this except for topical antibiotics the nature of which were not really sure. His primary doctor did send him to see Dr. [name] of interventional cardiology. He underwent an angiogram on 08/06/17 and he underwent a PTA and directional atherectomy of the lesser distal SFA and popliteal arteries which resulted in brisk  improvement in blood flow. It was noted that he had 2 vessel runoff through the anterior tibial and peroneal. He is also been to see vascular and interventional radiologist. He was not felt to have any significant superficial venous insufficiency. Presumably is not a candidate for any ablation. It  was suggested he come here for wound care. The patient is a type II diabetic on insulin. He also has a history of venous insufficiency. ABIs on the left were noncompressible in our clinic 10/21/17; patient we admitted to the clinic last week. 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 We put Iodosorb on him after an aggressive debridement and 3 layer compression. He complained of pain in his ankle and itching with is skin in fact he scratched the area on the medial calf superiorly at the rim of our wraps and he has 2 small open areas in that location today which are new. I changed his primary dressing today to silver collagen. As noted he is already had revascularization and does not have any significant superficial venous insufficiency that would be amenable to ablation 10/28/17; patient admitted to the clinic 2 weeks ago. He has a smaller Wound. Scratch injury from last week revealed. There is large wound over the tibial area. This is smaller. Granulation looks healthy. No need for debridement. 11/04/17; the wound on the left lateral calf looks better. Improved dimensions. Surface of this looks better. We've been maintaining him and Kerlix Coban wraps. He finds this much more comfortable. Silver collagen dressing 11/11/17; left lateral Wound continues to look healthy be making progress. Using a #5 curet I removed removed nonviable skin from the surface of the wound and then necrotic debris from the wound surface. Surface of the wound continues to look healthy. He also has an open area on the left great toenail bed. We've been using topical  antibiotics. 11/19/17; left anterior lateral wound continues to look healthy but it's not closed. He also had a small wound above this on the left leg Initially traumatic wounds in the setting of significant chronic venous insufficiency and stasis dermatitis 11/25/17; left anterior wounds superiorly is closed still a small wound inferiorly. 12/02/17; left anterior tibial area. Arrives today with adherent callus. Post debridement clearly not completely closed. Hydrofera Blue under 3 layer compression. 12/09/17; left anterior tibia. Circumferential eschar however the wound bed looks stable to improved. We've been using Hydrofera Blue under 3 layer compression 12/17/17; left anterior tibia. Apparently this was felt to be closed however when the wrap was taken off there is a skin tear to reopen wounds in the same area we've been using Hydrofera Blue under 3 layer compression 12/23/17 left anterior tibia. Not close to close this week apparently the [facility] was stuck to this again. Still circumferential eschar requiring debridement. I put a contact layer on this this time under the Hydrofera Blue 12/31/17; left anterior tibia. Wound is better slight amount of hyper-granulation. Using Hydrofera Blue over Adaptic. 01/07/18; left anterior tibia. The wound had some surface eschar however after this was removed he has no open wound.he was already revascularized by Dr. [name] when he came to our clinic with atherectomy of the left SFA and popliteal artery. He was also sent to interventional radiology for venous reflux studies. He was not felt to have significant reflux but certainly has chronic venous changes of his skin with hemosiderin deposition around this area. He will definitely need to lubricate his skin and wear compression stocking and I've talked to him about this. READMISSION 05/26/2018 This is a now 71 year old man we cared for with traumatic wounds on his left anterior lower extremity. He had been  previously revascularized during that admission by Dr. [name]. Apparently in follow-up Dr. [name]  noted that he had deterioration in his arterial status. He underwent a stent placement in the distal left SFA on 04/22/2018. 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 He went back to the angiography suite on 04/30/2018 he underwent PTA and balloon angioplasty of the occluded left mid anterior tibial artery, thrombotic occlusion went from 100 to 0% which reconstitutes the posterior tibial artery. He had thrombectomy and aspiration of the peroneal artery. The stent placed in the distal SFA left SFA was still occluded. 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 He has been recuperating at [facility] in [facility] after the most recent hospitalization. He is going home tomorrow. He tells me that 2 weeks ago he traumatized the tip of his left fifth toe. He came in urgently for our review of this. This was a history of before I noted that Dr. [name] had already noted dry gangrenous changes of the left fifth toe 06/09/2018; 2-week follow-up. I did contact Dr. [name] after his last appointment and he apparently saw 1 of Dr. Irven Shelling colleagues the next day. He does not follow-up with Dr. [name] himself until Thursday of this week. 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 Nevertheless there is no evidence of infection no drainage and no pain. He had a new area that this week when we were signing him in today on the left anterior mid tibia area, this is in close proximity to the previous wound we have dealt with in this clinic. 06/23/2018; 2-week follow-up. I did not receive a recent note from Dr. [name] to review today. Our  office is trying to obtain this. He is apparently not planning to do further vascular interventions and wondered about compression to try and help with the patient's chronic venous insufficiency. However we are also concerned about the arterial flow. He arrives in clinic today with a new area on the left third toe. The areas on the calf/anterior tibia are close to closing. The left fifth toe is still mummified using Betadine. -In reviewing things with the patient he has what sounds like claudication with mild to moderate amount of activity. 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 I prescribed Levaquin over the phone while we attempted to arrange a plan of care. However the patient called yesterday to report he had low-grade fever and he came in today acutely. There is been a marked deterioration in the left third toe with spreading cellulitis up into the dorsal left foot. He was referred to the emergency room. Readmission: 06/29/2020 patient presents today for reevaluation here in our clinic he was previously treated by Dr. Dellia Nims at the latter part of 2019 in 2 the beginning of 2020. Subsequently we have not seen him since that time in the interim he did have evaluation with vein and vascular specialist specifically Dr. Anice Paganini who did perform quite extensive work for a left femoral to anterior tibial artery bypass. With that being said in the interim the patient has developed significant lymphedema and has wounds that he tells me have really never healed in regard to the incision site on the left leg. He also has multiple wounds on the feet for various  reasons some of which is that he tends to pick at his feet. Fortunately there is no signs of active infection systemically at this time he does have some wounds that are little bit deeper but most are fairly superficial he seems to have good blood flow and overall everything appears to be healthy I see no bone  exposed and no obvious signs of osteomyelitis. I do not know that he necessarily needs a x-ray at this point although that something we could consider depending on how things progress. The patient does have a history of lymphedema, diabetes, this is type II, chronic kidney disease stage III, hypertension, and history of peripheral vascular disease. 07/05/2020; patient admitted last week. Is a patient I remember from 2019 he had a spreading infection involving the left foot and we sent him to the hospital. 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 He subsequently had a left femoral to anterior tibial bypass by Dr.Cain vein and vascular. He also has severe lymphedema with chronic skin changes related to that on the left leg. The most problematic area that was new today was on the left medial great toe. This was apparently a small area last week there was purulent drainage which our intake nurse cultured. Also areas on the left medial foot and heel left lateral foot. He has 2 areas on the left medial calf left lateral calf in the setting of the severe lymphedema. 07/13/2020 on evaluation today patient appears to be doing better in my opinion compared to his last visit. The good news is there is no signs of active infection systemically and locally I do not see any signs of infection either. He did have an x-ray which was negative that is great news he had a culture which showed MRSA but at the same time he is been on the doxycycline which has helped. I do think we may want to extend this for 7 additional days 1/25; patient admitted to the clinic a few weeks ago. 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 We have been putting him under compression his edema control is a lot better but he is severe verricused skin on the left leg. He is really done quite well he still has an open area on the left medial calf and the left  medial first metatarsal head. We have been using silver collagen on the leg silver alginate on the foot 07/27/2020 upon evaluation today patient appears to be doing decently well in regard to his wounds. He still has a lot of dry skin on the left leg. Some of this is starting to peel back and I think he may be able to have them out by removing some that today. Fortunately there is no signs of active infection at this time on the left leg although on the right leg he does appear to have swelling and erythema as well as some mild warmth to touch. This does have been concerned about the possibility of cellulitis although within the differential diagnosis I do think that potentially a DVT has to be at least considered. We need to rule that out before proceeding would just call in the cellulitis. Especially since he is having pain in the posterior aspect of his calf muscle. 2/8; the patient had seen sparingly. He has severe skin changes of chronic lymphedema in the left leg thickened hyperkeratotic verrucous skin. He has an open wound on the  medial part of the left first met head left mid tibia. He also has a rim of nonepithelialized skin in the anterior mid tibia. He brought in the AmLactin lotion that was been prescribed although I am not sure under compression and its utility. There concern about cellulitis on the right lower leg the last time he was here. He was put on on antibiotics. His DVT rule out was negative. The right leg looks fine he is using his stocking on this area 08/10/2020 upon evaluation today patient appears to be doing well with regard to his leg currently. He has been tolerating the dressing changes without complication. Fortunately there is no signs of active infection which is great news. Overall very pleased with where things stand. 2/22; the patient still has an area on the medial part of the left first met his head. This looks better than when I last saw this earlier this month he  has a rim of epithelialization but still some surface debris. Mostly everything on the left leg is healed. There is still a vulnerable in the left mid tibia area. 08/30/2020 upon evaluation today patient appears to be doing much better in regard to his wounds on his foot. 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 Nonetheless I think we will address that today I Minna send in a prescription for him in that regard. Overall though there does not appear to be any signs of significant worsening. 09/07/2020 on evaluation today patient's wounds over his left foot appear to be doing excellent. I do not see any signs of infection there is some callus buildup this can require debridement for certain but overall I feel like he is managing quite nicely. He still using the AmLactin cream which has been beneficial for him as well. 3/22; left foot wound is closed. There is no open area here. He is using ammonium lactate lotion to the lower extremities to help exfoliate dry cracked skin. He has compression stockings from elastic therapy in [place]. The wound on the medial part of his left first met head is healed today. READMISSION 04/12/2021 Mr. Diesing is a patient we know fairly well he had a prolonged stay in clinic in 2019 with wounds on his left lateral and left anterior lower extremity in the setting of chronic venous insufficiency. More recently he was here earlier this year with predominantly an area on his left foot first metatarsal head plantar and he says the plantar foot broke down on its not long after we discharged him but he did not come back here. The last few months areas of broken down on his left anterior and again the left lateral lower extremity. The leg itself is very swollen chronically enlarged a lot of hyperkeratotic dry Berry Q skin in the left lower  leg. His edema extends well into the thigh. He was seen by Dr. Donzetta Matters. He had ABIs on 03/02/2021 showing an ABI on the right of 1 with a TBI of 0.72 his ABI in the left at 1.09 TBI of 0.99. Monophasic and biphasic waveforms on the right. On the left monophasic waveforms were noted he went on to have an angiogram on 03/27/2021 this showed the aortic aortic and iliac segments were free of flow-limiting stenosis the left common femoral vein to evaluate the left femoral to anterior tibial artery bypass was unobstructed the bypass was  patent without any areas of stenosis. We discharged the patient in bilateral juxta lite stockings but very clearly that was not sufficient to control the swelling and maintain skin integrity. He is clearly going to need compression pumps. 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 This is fortunate because he is on his feet for long periods of time. 10/27; patient comes in with our intake nurse reporting copious amount of green drainage from the left anterior mid tibia the left dorsal foot and to a lesser extent the left medial mid tibia. We left the compression wrap on all week for the amount of edema in his left leg is quite a bit better. We use silver alginate as the primary dressing 11/3; edema control is good. Left anterior lower leg left medial lower leg and the plantar first metatarsal head. The left anterior lower leg required debridement. Deep tissue culture I did of this wound showed MRSA I put him on 10 days of doxycycline which she will start today. We have him in compression wraps. He has a security card and [employer] however he is retiring on November 15. We will need to then get him into a better offloading boot for the left foot perhaps a total contact cast 11/10; edema control is quite good. Left anterior and left medial lower leg wounds in the setting of chronic venous insufficiency and lymphedema. He also has a  substantial area over the left plantar first metatarsal head. 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 He has significant hypergranulation on the left plantar foot wound. The patient is a diabetic but he does not have significant PAD 11/17; edema control is quite good. Left anterior and left medial lower leg wounds look better. The really concerning area remains the area on the left plantar first metatarsal head. He has a rim of epithelialization. 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 Starting today with a forefoot off loader but . possibly a total contact cast. 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 Arrives with a very significant odor from I think all of the wounds on the left leg and the left foot. Because of this I did not put a total contact cast on him today but will could still consider this. His wife was having cataract surgery which is the reason he missed the appointment 12/6. I saw this man 5 days ago with a swelling below the popliteal fossa. I thought he actually might have a Baker's cyst however the DVT rule out study that we could arrange right away was negative the technician told me this was not a ruptured Baker's cyst. We attempted to get this aspirated by under ultrasound guidance in interventional radiology however all they did was an ultrasound however it shows an extensive fluid collection 62 x 8 x 9.4 in the left thigh and left calf. The patient states he thinks this started 8 days ago or so but he really is not complaining of any pain, fever or systemic symptoms. He has not ha 12/20; after some difficulty I managed to  get the patient into see Dr. Donzetta Matters.  Eventually he was taken into the hospital and had a drain put in the fluid collection below his left knee posteriorly extending into the posterior thigh. He still has the drain in place. Culture of this showed moderate staff aureus few Morganella and few Klebsiella he is now on doxycycline and ciprofloxacin as suggested by infectious disease he is on this for a month. The drain will remain in place until it stops draining 12/29; he comes in today with the 1 wound on his left leg and the area on the left plantar first met head significantly smaller. Both look healthy. He still has the drain in the left leg. He says he has to change this daily. Follows up with Dr. Donzetta Matters on January 11. 06/29/2021; the wounds that I am following on the left leg and left first met head continued to be quite healthy. However the area where his inferior drain is in place had copious amounts of drainage which was green in color. The wound here is larger. Follows up with Dr. Gwenlyn Saran of vein and vascular his surgeon next week as well as infectious disease. He remains on ciprofloxacin and doxycycline. He is not complaining of excessive pain in either one of the drain areas 1/12; the patient saw vascular surgery and infectious disease. Vascular surgery has left the drain in place as there was still some notable drainage still see him back in 2 weeks. Dr. Velna Ochs stop the doxycycline and ciprofloxacin and I do not believe he follows up with them at this point. Culture I did last week showed both doxycycline resistant MRSA and Pseudomonas not sensitive to ciprofloxacin although only in rare titers 1/19; the patient's wound on the left anterior lower leg is just about healed. We have continued healing of the area that was medially on the left leg. Left first plantar metatarsal head continues to get smaller. The major problem here is his 2 drain sites 1 on the left upper calf and lateral thigh. There is purulent drainage still from the left  lateral thigh. I gave him antibiotics last week but we still have recultured. He has the drain in the area I think this is eventually going to have to come out. I suspect there will be a connecting wound to heal here perhaps with improved VAc 1/26; the patient had his drain removed by vein and vascular on 1/25/. This was a large pocket of fluid in his left thigh that seem to tunnel into his left upper calf. 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 His mention his Penrose drain was removed today. He now has a tunneling wound on his left calf and left thigh. Both of these probe widely towards each other although I cannot really prove that they connect. Both wounds on his lower leg anteriorly are closed and his area over the first metatarsal head on his right foot continues to improve. We are using Hydrofera Blue here. He also saw infectious disease culture of the abscess they noted was polymicrobial with MRSA, Morganella and Klebsiella he was treated with doxycycline and ciprofloxacin for 4 weeks ending on 07/03/2021. They did not recommend any further antibiotics. Notable that while he still had the Penrose drain in place last week he had purulent drainage coming out of the inferior IandD site this grew [facility], MRSA and Pseudomonas but there does not appear to be any active infection in this area today with the drain out and  he is not systemically unwell 2/2; with regards to the drain sites the superior one on the thigh actually is closed down the one on the upper left lateral calf measures about 8 and half centimeters which is an improvement seems to be less prominent although still with a lot of drainage. The only remaining wound is over the first metatarsal head on the left foot and this looks to be continuing to improve with Hydrofera Blue. 2/9; the area on his plantar left foot continues to contract. Callus around the wound edge. The drain sites specifically have not come  down in depth. We put the wound VAC on Monday he changed the canister late last night our intake nurse reported a pocket of fluid perhaps caused by our compression wraps 2/16; continued improvement in left foot plantar wound. drainage site in the calf is not improved in terms of depth (wound vac) 2/23; continued improvement in the left foot wound over the first metatarsal head. With regards to the drain sites the area on his thigh laterally is healed however the open area on his calf is small in terms of circumference by still probes in by about 15 cm. Within using the wound VAC. Hydrofera Blue on his foot 08/24/2021: The left first metatarsal head wound continues to improve. The wound bed is healthy with just some surrounding callus. Unfortunately the open drain site on his calf remains open and tunnels at least 15 cm (the extent of a Q-tip). This is despite several weeks of wound VAC treatment. Based on reading back through the notes, there has been really no significant change in the depth of the wound, although the orifice is smaller and the more cranial wound on his thigh has closed. I suspect the tunnel tracks nearly all the way to this location. 08/31/2021: Continued improvement in the left first metatarsal head wound. There has been absolutely no improvement to the long tunnel from his open drain site on his calf. We have tried to get him into see vascular surgery sooner to consider the possibility of simply filleting the tract open and allowing it to heal from the bottom up, likely with a wound VAC. They have not yet scheduled a sooner appointment than his current mid April 09/14/2021: He was seen by vascular surgery and they took him to the operating room last week. They opened a portion of the tunnel, but did not extend the entire length of the known open subcutaneous tract. I read Dr. Claretha Cooper operative note and it is not clear from that documentation why only a portion of the tract was opened.  The heaped up granulation tissue was curetted and removed from at least some portion of the tract. They did place a wound VAC and applied an Unna boot to the leg. The ulcer on his left first metatarsal head is smaller today. The bed looks good and there is just a small amount of surrounding callus. 09/21/2021: The ulcer on his left first metatarsal head looks to be stalled. There is some callus surrounding the wound but the wound bed itself does not appear particularly dynamic. The tunnel tract on his lateral left leg seems to be roughly the same length or perhaps slightly smaller but the wound bed appears healthy with good granulation tissue. He opened up a new wound on his medial thigh and the site of a prior surgical incision. He says that he did this unconsciously in his sleep by scratching. 09/28/2021: Unfortunately, the ulcer on his left first metatarsal  head has extended underneath the callus toward the dorsum of his foot. The medial thigh wounds are roughly the same. The tunnel on his lateral left leg continues to be problematic; it is longer than we are able to actually probe with a Q-tip. I am still not certain as to why Dr. Donzetta Matters did not open this up entirely when he took the patient to the operating room. We will likely be back in the same situation with just a small superficial opening in a long unhealed tract, as the open portion is granulating in nicely. 10/02/2021: The patient was initially scheduled for a nurse visit, but we are also applying a total contact cast today. The plantar foot wound looks clean without significant accumulated callus. We have been applying Prisma silver collagen to the site. 10/05/2021: The patient is here for his first total contact cast change. We have tried using gauze packing strips in the tunnel on his lateral leg wound, but this does not seem to be working any better than the white VAC foam. The foot ulcer looks about the same with minimal periwound callus.  Medial thigh wound is clean with just some overlying eschar. 10/12/2021: The plantar foot wound is stable without any significant accumulation of periwound callus. The surface is viable with good granulation tissue. The medial thigh wounds are much smaller and are epithelializing. On the other hand, he had purulent drainage coming from the tunnel on his lateral leg. He does go back to see Dr. Donzetta Matters next week and is planning to ask him why the wound tunnel was not completely opened at the time of his most recent operation. 10/19/2021: The plantar foot wound is markedly improved and has epithelial tissue coming through the surface. The medial thigh wounds are nearly closed with just a tiny open area. He did see Dr. Donzetta Matters earlier this week and apparently they did discuss the possibility of opening the sinus tract further and enabling a wound VAC application. Apparently there are some limits as to what Dr. 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 I think if we could get the tract open to the level of the popliteal fossa, this would greatly aid in her ability to get this chart closed. That being said, however, today when I probed the tract with a Q-tip, I was not able to insert the entirety of the Q-tip as I have on previous occasions. The tunnel is shorter by about 4 cm. The surface is clean with good granulation tissue and no further episodes of purulent drainage. 10/30/2021: Last week, the patient underwent surgery and had the long tract in his leg opened. There was a rind that was debrided, according to the operative report. His medial thigh ulcers are closed. The plantar foot wound is clean with a good surface and some built up surrounding callus. 11/06/2021: The overall dimensions of the large wound on his lateral leg remain about the same, but there is good granulation tissue present and the tunneling is a little bit shorter. He has a new wound on his anterior tibial  surface, in the same location where he had a similar lesion in the past. The plantar foot wound is clean with some buildup surrounding callus. Just toward the medial aspect of his foot, however, there is an area of darkening that once debrided, revealed another opening in the skin surface. 11/13/2021: The anterior tibial surface wound is closed. The plantar foot wound has some surrounding callus buildup. The area of darkening  that I debrided last week and revealed an opening in the skin surface has closed again. The tunnel in the large wound on his lateral leg has come in by about 3 cm. There is healthy granulation tissue on the entire wound surface. 11/23/2021: The patient was out of town last week and did wet-to-dry dressings on his large wound. 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 Unfortunately, he picked open the wound on his left medial thigh. He says that it was itching and he just could not stop scratching it until it was open again. The wound on his plantar foot is smaller and has not accumulated a tremendous amount of callus. The lateral leg wound is shallower and the tunnel has also decreased in depth. There is just a little bit of slough accumulation on the surface. 11/30/2021: Another portion of his left medial thigh has been opened up. All of these wounds are fairly superficial with just a little bit of slough and eschar accumulation. The wound on his plantar foot is almost closed with just a bit of eschar and periwound callus accumulation. The lateral leg wound is nearly flush with the surrounding skin and the tunnel is markedly shallower. 12/07/2021: There is just 1 open area on his left medial thigh. It is clean with just a little bit of perimeter eschar. The wound on his plantar foot continues to contract and just has some eschar and periwound callus accumulation. The lateral leg wound is closing at the more distal  aspect and the tunnel is smaller. The surface is nearly flush with the surrounding skin and it has a good bed of granulation tissue. Electronic Signature(s) Signed: 12/07/2021 12:41:34 PM By: Fredirick Maudlin MD FACS Entered By: Fredirick Maudlin on 12/07/2021 12:41:34 -------------------------------------------------------------------------------- Physical Exam Details Patient Name: Date of Service: [name]. 12/07/2021 10:45 A M Medical Record Number: [id number] Patient Account Number: [id number] Date of Birth/Sex: Treating RN: 10/06/1950 (71 y.o. [name] Primary Care Provider: [name] Other Clinician: Referring Provider: Treating Provider/Extender: [name] in Treatment: 34 Constitutional . Slightly bradycardic, asymptomatic.. . . No acute distress.Marland Kitchen Respiratory Normal work of breathing on room air.. Notes 12/07/2021: There is just 1 open area on his left medial thigh. It is clean with just a little bit of perimeter eschar. The wound on his plantar foot continues to contract and just has some eschar and periwound callus accumulation. The lateral leg wound is closing at the more distal aspect and the tunnel is smaller. The surface is nearly flush with the surrounding skin and it has a good bed of granulation tissue. Electronic Signature(s) Signed: 12/07/2021 12:42:10 PM By: Fredirick Maudlin MD FACS Entered By: Fredirick Maudlin on 12/07/2021 12:42:09 -------------------------------------------------------------------------------- Physician Orders Details Patient Name: Date of Service: [name]. 12/07/2021 10:45 A M Medical Record Number: [id number] Patient Account Number: [id number] Date of Birth/Sex: Treating RN: 1951/03/31 (71 y.o. [name] Primary Care Provider: [name] Other Clinician: Referring Provider: Treating Provider/Extender: [name] in Treatment: 31 Verbal / Phone  Orders: No Diagnosis Coding ICD-10 Coding Code Description E11.621 Type 2 diabetes mellitus with foot ulcer 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 I89.0 Lymphedema, not elsewhere classified I87.322 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verity 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 of  other part of left foot with 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 - Dr Celine Ahr - Room 2 - Thursday 6/22 at 7:45 Return A Nurse Visit: - Monday 6/19 at 2:00 pm (Room 3) Bathing/ Shower/ Hygiene May shower with protection but do not get wound dressing(s) wet. - Use a cast protector so you can shower without getting your wrap(s) wet Negative Presssure Wound Therapy Wound #22 Left,Lateral Lower Leg Wound Vac to wound continuously at 116m/hg pressure - change 2 times per week in clinic Black Foam White Foam Edema Control - Lymphedema / SCD / Other Elevate legs to the level of the heart or above for 30 minutes daily and/or when sitting, a frequency of: - throughout the day Avoid standing for long periods of time. Patient to wear own compression stockings every day. - on right leg; Moisturize legs daily. - Ammonium lactate to right leg daily Wound Treatment Wound #18 - Metatarsal head first Wound Laterality: Plantar, Left Cleanser: Soap and Water 2 x Per Week/30 Days Discharge Instructions: May shower and wash wound with dial antibacterial soap and water prior to dressing change. Cleanser: Wound Cleanser 2 x Per Week/30 Days Discharge Instructions: Cleanse the wound with wound cleanser prior to applying a clean dressing using gauze sponges, not tissue or cotton balls. Peri-Wound Care: Triamcinolone 15 (g) 2 x Per Week/30  Days Discharge Instructions: Use triamcinolone 15 (g) as directed Peri-Wound Care: Sween Lotion (Moisturizing lotion) 2 x Per Week/30 Days Discharge Instructions: Apply moisturizing lotion as directed Prim Dressing: Promogran Prisma Matrix, 4.34 (sq in) (silver collagen) 2 x Per Week/30 Days ary Discharge Instructions: Moisten collagen with saline or hydrogel Secondary Dressing: Woven Gauze Sponge, Non-Sterile 4x4 in 2 x Per Week/30 Days Discharge Instructions: Apply over primary dressing as directed. Secured With: 26M Medipore H Soft Cloth Surgical T ape, 4 x 10 (in/yd) 2 x Per Week/30 Days Discharge Instructions: Secure with tape as directed. Compression Wrap: CoFlex TLC XL 2-layer Compression System 4x7 (in/yd) 2 x Per Week/30 Days Discharge Instructions: Apply CoFlex 2-layer compression as directed. (alt for 4 layer) Wound #22 - Lower Leg Wound Laterality: Left, Lateral Cleanser: Soap and Water 1 x Per Week/30 Days Discharge Instructions: May shower and wash wound with dial antibacterial soap and water prior to dressing change. Cleanser: Wound Cleanser 1 x Per Week/30 Days Discharge Instructions: Cleanse the wound with wound cleanser prior to applying a clean dressing using gauze sponges, not tissue or cotton balls. Prim Dressing: VAC ary 1 x Per Week/30 Days Wound #24R - Upper Leg Wound Laterality: Left, Medial Cleanser: Wound Cleanser 2 x Per Week/30 Days Discharge Instructions: Cleanse the wound with wound cleanser prior to applying a clean dressing using gauze sponges, not tissue or cotton balls. Peri-Wound Care: Sween Lotion (Moisturizing lotion) 2 x Per Week/30 Days Discharge Instructions: Apply moisturizing lotion as directed Prim Dressing: KerraCel Ag Gelling Fiber Dressing, 2x2 in (silver alginate) 2 x Per Week/30 Days ary Discharge Instructions: Apply silver alginate to wound bed as instructed Secondary Dressing: Bordered Gauze, 4x4 in 2 x Per Week/30 Days Discharge  Instructions: Apply over primary dressing as directed. Secured With: Elastic Bandage 4 inch (ACE bandage) 2 x Per Week/30 Days Discharge Instructions: Secure with ACE bandage as directed. Electronic Signature(s) Signed: 12/07/2021 1:33:22 PM By: CFredirick MaudlinMD FACS Entered By: CFredirick Maudlinon 12/07/2021 12:42:29 -------------------------------------------------------------------------------- Problem List Details Patient Name: Date of Service: LLucillie [name]  12/07/2021 10:45 A M Medical Record Number: [id number] Patient Account Number: [id number] Date of Birth/Sex: Treating RN: Jun 13, 1951 (71 y.o. [name] Primary Care Provider: [name] Other Clinician: Referring Provider: Treating Provider/Extender: [name] in Treatment: 11 Active Problems ICD-10 Encounter Code Description Active Date MDM Diagnosis E11.621 Type 2 diabetes mellitus with foot ulcer 04/12/2021 No Yes 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 04/12/2021 No Yes I89.0 Lymphedema, not elsewhere classified 04/12/2021 No Yes I87.322 Chronic venous hypertension (idiopathic) with inflammation of left lower 04/12/2021 No Yes extremity 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 ulcer of other part of left lower leg with other specified 04/12/2021 No Yes severity 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 of other part of left foot with other specified 04/12/2021 No Yes severity 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 07/20/2021 No Yes Inactive Problems ICD-10 Code Description Active Date Inactive Date E11.42 Type 2 diabetes mellitus with diabetic polyneuropathy 04/12/2021 04/12/2021 L02.416 Cutaneous abscess of left lower limb 06/13/2021 06/13/2021 Resolved Problems Electronic Signature(s) Signed: 12/07/2021 12:38:41 PM By: Fredirick Maudlin MD FACS Entered By: Fredirick Maudlin on 12/07/2021  12:38:41 -------------------------------------------------------------------------------- Progress Note Details Patient Name: Date of Service: [name]. 12/07/2021 10:45 A M Medical Record Number: [id number] Patient Account Number: [id number] Date of Birth/Sex: Treating RN: 1951-02-06 (71 y.o. [name] Primary Care Provider: [name] Other Clinician: Referring Provider: Treating Provider/Extender: [name] in Treatment: 34 Subjective Chief Complaint Information obtained from Patient Left leg and foot ulcers 04/12/2021; patient is here for wounds on his left lower leg and left plantar foot over the first metatarsal head History of Present Illness (HPI) 10/11/17; Mr. Ambroise is a 71 year old man who tells me that in 2015 he slipped down the latter traumatizing his left leg. He developed a wound in the same spot the area that we are currently looking at. He states this closed over for the most part although he always felt it was somewhat unstable. In 2016 he hit the same area with the door of his car had this reopened. He tells me that this is never really closed although sometimes an inflow it remains open on a constant basis. He has not been using any specific dressing to this except for topical antibiotics the nature of which were not really sure. His primary doctor did send him to see Dr. [name] of interventional cardiology. He underwent an angiogram on 08/06/17 and he underwent a PTA and directional atherectomy of the lesser distal SFA and popliteal arteries which resulted in brisk improvement in blood flow. It was noted that he had 2 vessel runoff through the anterior tibial and peroneal. He is also been to see vascular and interventional radiologist. He was not felt to have any significant superficial venous insufficiency. Presumably is not a candidate for any ablation. It was suggested he come here for wound care. The patient is a type II  diabetic on insulin. He also has a history of venous insufficiency. ABIs on the left were noncompressible in our clinic 10/21/17; patient we admitted to the clinic last week. 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 We put Iodosorb on him after an aggressive debridement and 3 layer compression. He complained of pain in his ankle and itching with is skin in fact he scratched the area on the medial calf superiorly at the rim of our wraps and he has 2 small  open areas in that location today which are new. I changed his primary dressing today to silver collagen. As noted he is already had revascularization and does not have any significant superficial venous insufficiency that would be amenable to ablation 10/28/17; patient admitted to the clinic 2 weeks ago. He has a smaller Wound. Scratch injury from last week revealed. There is large wound over the tibial area. This is smaller. Granulation looks healthy. No need for debridement. 11/04/17; the wound on the left lateral calf looks better. Improved dimensions. Surface of this looks better. We've been maintaining him and Kerlix Coban wraps. He finds this much more comfortable. Silver collagen dressing 11/11/17; left lateral Wound continues to look healthy be making progress. Using a #5 curet I removed removed nonviable skin from the surface of the wound and then necrotic debris from the wound surface. Surface of the wound continues to look healthy. ooHe also has an open area on the left great toenail bed. We've been using topical antibiotics. 11/19/17; left anterior lateral wound continues to look healthy but it's not closed. ooHe also had a small wound above this on the left leg ooInitially traumatic wounds in the setting of significant chronic venous insufficiency and stasis dermatitis 11/25/17; left anterior wounds superiorly is closed still a small wound inferiorly. 12/02/17; left  anterior tibial area. Arrives today with adherent callus. Post debridement clearly not completely closed. Hydrofera Blue under 3 layer compression. 12/09/17; left anterior tibia. Circumferential eschar however the wound bed looks stable to improved. We've been using Hydrofera Blue under 3 layer compression 12/17/17; left anterior tibia. Apparently this was felt to be closed however when the wrap was taken off there is a skin tear to reopen wounds in the same area we've been using Hydrofera Blue under 3 layer compression 12/23/17 left anterior tibia. Not close to close this week apparently the [facility] was stuck to this again. Still circumferential eschar requiring debridement. I put a contact layer on this this time under the Hydrofera Blue 12/31/17; left anterior tibia. Wound is better slight amount of hyper-granulation. Using Hydrofera Blue over Adaptic. 01/07/18; left anterior tibia. The wound had some surface eschar however after this was removed he has no open wound.he was already revascularized by Dr. [name] when he came to our clinic with atherectomy of the left SFA and popliteal artery. He was also sent to interventional radiology for venous reflux studies. He was not felt to have significant reflux but certainly has chronic venous changes of his skin with hemosiderin deposition around this area. He will definitely need to lubricate his skin and wear compression stocking and I've talked to him about this. READMISSION 05/26/2018 This is a now 71 year old man we cared for with traumatic wounds on his left anterior lower extremity. He had been previously revascularized during that admission by Dr. [name]. Apparently in follow-up Dr. [name] noted that he had deterioration in his arterial status. He underwent a stent placement in the distal left SFA on 04/22/2018. 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 He went back to the angiography suite on 04/30/2018 he underwent PTA and balloon  angioplasty of the occluded left mid anterior tibial artery, thrombotic occlusion went from 100 to 0% which reconstitutes the posterior tibial artery. He had thrombectomy and aspiration of the peroneal artery. The stent placed in the distal SFA left SFA was still occluded. He was discharged on Xarelto, it was noted on the discharge summary from this hospitalization that he had gangrene at the tip of his  left fifth toe and there were expectations this would auto amputate. 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 He has been recuperating at [facility] after the most recent hospitalization. He is going home tomorrow. He tells me that 2 weeks ago he traumatized the tip of his left fifth toe. He came in urgently for our review of this. This was a history of before I noted that Dr. [name] had already noted dry gangrenous changes of the left fifth toe 06/09/2018; 2-week follow-up. I did contact Dr. [name] after his last appointment and he apparently saw 1 of Dr. Irven Shelling colleagues the next day. He does not follow-up with Dr. [name] himself until Thursday of this week. 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 Nevertheless there is no evidence of infection no drainage and no pain. He had a new area that this week when we were signing him in today on the left anterior mid tibia area, this is in close proximity to the previous wound we have dealt with in this clinic. 06/23/2018; 2-week follow-up. I did not receive a recent note from Dr. [name] to review today. Our office is trying to obtain this. He is apparently not planning to do further vascular interventions and wondered about compression to try and help with the patient's chronic venous insufficiency. However we are also concerned about the arterial flow. ooHe arrives in clinic today with a new area on the left third toe. The areas on the calf/anterior tibia are  close to closing. The left fifth toe is still mummified using Betadine. -In reviewing things with the patient he has what sounds like claudication with mild to moderate amount of activity. 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 I prescribed Levaquin over the phone while we attempted to arrange a plan of care. However the patient called yesterday to report he had low-grade fever and he came in today acutely. There is been a marked deterioration in the left third toe with spreading cellulitis up into the dorsal left foot. He was referred to the emergency room. Readmission: 06/29/2020 patient presents today for reevaluation here in our clinic he was previously treated by Dr. Dellia Nims at the latter part of 2019 in 2 the beginning of 2020. Subsequently we have not seen him since that time in the interim he did have evaluation with vein and vascular specialist specifically Dr. Anice Paganini who did perform quite extensive work for a left femoral to anterior tibial artery bypass. With that being said in the interim the patient has developed significant lymphedema and has wounds that he tells me have really never healed in regard to the incision site on the left leg. He also has multiple wounds on the feet for various reasons some of which is that he tends to pick at his feet. Fortunately there is no signs of active infection systemically at this time he does have some wounds that are little bit deeper but most are fairly superficial he seems to have good blood flow and overall everything appears to be healthy I see no bone exposed and no obvious signs of osteomyelitis. I do not know that he necessarily needs a x-ray at this point although that something we could consider depending on how things progress. The patient does have a history of lymphedema, diabetes, this is type II, chronic kidney disease stage III, hypertension, and  history of peripheral vascular  disease. 07/05/2020; patient admitted last week. Is a patient I remember from 2019 he had a spreading infection involving the left foot and we sent him to the hospital. 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 He subsequently had a left femoral to anterior tibial bypass by Dr.[facility] vein and vascular. He also has severe lymphedema with chronic skin changes related to that on the left leg. The most problematic area that was new today was on the left medial great toe. This was apparently a small area last week there was purulent drainage which our intake nurse cultured. Also areas on the left medial foot and heel left lateral foot. He has 2 areas on the left medial calf left lateral calf in the setting of the severe lymphedema. 07/13/2020 on evaluation today patient appears to be doing better in my opinion compared to his last visit. The good news is there is no signs of active infection systemically and locally I do not see any signs of infection either. He did have an x-ray which was negative that is great news he had a culture which showed MRSA but at the same time he is been on the doxycycline which has helped. I do think we may want to extend this for 7 additional days 1/25; patient admitted to the clinic a few weeks ago. 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 We have been putting him under compression his edema control is a lot better but he is severe verricused skin on the left leg. He is really done quite well he still has an open area on the left medial calf and the left medial first metatarsal head. We have been using silver collagen on the leg silver alginate on the foot 07/27/2020 upon evaluation today patient appears to be doing decently well in regard to his wounds. He still has a lot of dry skin on the left leg. Some of this is starting to peel back and I think he may be able to have them out by removing  some that today. Fortunately there is no signs of active infection at this time on the left leg although on the right leg he does appear to have swelling and erythema as well as some mild warmth to touch. This does have been concerned about the possibility of cellulitis although within the differential diagnosis I do think that potentially a DVT has to be at least considered. We need to rule that out before proceeding would just call in the cellulitis. Especially since he is having pain in the posterior aspect of his calf muscle. 2/8; the patient had seen sparingly. He has severe skin changes of chronic lymphedema in the left leg thickened hyperkeratotic verrucous skin. He has an open wound on the medial part of the left first met head left mid tibia. He also has a rim of nonepithelialized skin in the anterior mid tibia. He brought in the AmLactin lotion that was been prescribed although I am not sure under compression and its utility. There concern about cellulitis on the right lower leg the last time he was here. He was put on on antibiotics. His DVT rule out was negative. The right leg looks fine he is using his stocking on this area 08/10/2020 upon evaluation today patient appears to be doing well with regard to his leg currently. He has been tolerating the dressing changes without complication.  Fortunately there is no signs of active infection which is great news. Overall very pleased with where things stand. 2/22; the patient still has an area on the medial part of the left first met his head. This looks better than when I last saw this earlier this month he has a rim of epithelialization but still some surface debris. Mostly everything on the left leg is healed. There is still a vulnerable in the left mid tibia area. 08/30/2020 upon evaluation today patient appears to be doing much better in regard to his wounds on his foot. Fortunately there does not appear to be any signs of active infection  systemically though locally we did culture this last week and it does appear that he does have MRSA currently. Nonetheless I think we will address that today I Minna send in a prescription for him in that regard. Overall though there does not appear to be any signs of significant worsening. 09/07/2020 on evaluation today patient's wounds over his left foot appear to be doing excellent. I do not see any signs of infection there is some callus buildup this can require debridement for certain but overall I feel like he is managing quite nicely. He still using the AmLactin cream which has been beneficial for him as well. 3/22; left foot wound is closed. There is no open area here. He is using ammonium lactate lotion to the lower extremities to help exfoliate dry cracked skin. He has compression stockings from [facility] in [place]. The wound on the medial part of his left first met head is healed today. READMISSION 04/12/2021 Mr. Banes is a patient we know fairly well he had a prolonged stay in clinic in 2019 with wounds on his left lateral and left anterior lower extremity in the setting of chronic venous insufficiency. More recently he was here earlier this year with predominantly an area on his left foot first metatarsal head plantar and he says the plantar foot broke down on its not long after we discharged him but he did not come back here. The last few months areas of broken down on his left anterior and again the left lateral lower extremity. The leg itself is very swollen chronically enlarged a lot of hyperkeratotic dry Berry Q skin in the left lower leg. His edema extends well into the thigh. He was seen by Dr. Donzetta Matters. He had ABIs on 03/02/2021 showing an ABI on the right of 1 with a TBI of 0.72 his ABI in the left at 1.09 TBI of 0.99. Monophasic and biphasic waveforms on the right. On the left monophasic waveforms were noted he went on to have an angiogram on 03/27/2021 this showed the aortic  aortic and iliac segments were free of flow-limiting stenosis the left common femoral vein to evaluate the left femoral to anterior tibial artery bypass was unobstructed the bypass was patent without any areas of stenosis. We discharged the patient in bilateral juxta lite stockings but very clearly that was not sufficient to control the swelling and maintain skin integrity. He is clearly going to need compression pumps. 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 This is fortunate because he is on his feet for long periods of time. 10/27; patient comes in with our intake nurse reporting copious amount of green drainage from the left anterior mid tibia the left dorsal foot and to a lesser extent the left medial mid tibia. We  left the compression wrap on all week for the amount of edema in his left leg is quite a bit better. We use silver alginate as the primary dressing 11/3; edema control is good. Left anterior lower leg left medial lower leg and the plantar first metatarsal head. The left anterior lower leg required debridement. Deep tissue culture I did of this wound showed MRSA I put him on 10 days of doxycycline which she will start today. We have him in compression wraps. He has a security card and [employer] however he is retiring on November 15. We will need to then get him into a better offloading boot for the left foot perhaps a total contact cast 11/10; edema control is quite good. Left anterior and left medial lower leg wounds in the setting of chronic venous insufficiency and lymphedema. He also has a substantial area over the left plantar first metatarsal head. 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 He has significant hypergranulation on the left plantar foot wound. The patient is a diabetic but he does not have significant PAD 11/17; edema  control is quite good. Left anterior and left medial lower leg wounds look better. The really concerning area remains the area on the left plantar first metatarsal head. He has a rim of epithelialization. 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 Starting today with a forefoot off loader but . possibly a total contact cast. 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 Arrives with a very significant odor from I think all of the wounds on the left leg and the left foot. Because of this I did not put a total contact cast on him today but will could still consider this. His wife was having cataract surgery which is the reason he missed the appointment 12/6. I saw this man 5 days ago with a swelling below the popliteal fossa. I thought he actually might have a Baker's cyst however the DVT rule out study that we could arrange right away was negative the technician told me this was not a ruptured Baker's cyst. We attempted to get this aspirated by under ultrasound guidance in interventional radiology however all they did was an ultrasound however it shows an extensive fluid collection 62 x 8 x 9.4 in the left thigh and left calf. The patient states he thinks this started 8 days ago or so but he really is not complaining of any pain, fever or systemic symptoms. He has not ha 12/20; after some difficulty I managed to get the patient into see Dr. Donzetta Matters. Eventually he was taken into the hospital and had a drain put in the fluid collection below his left knee posteriorly extending into the posterior thigh. He still has the drain in place. Culture of this showed moderate staff aureus few Morganella and few Klebsiella he is now on doxycycline and ciprofloxacin as suggested by infectious disease he  is on this for a month. The drain will remain in place until it stops draining 12/29; he comes in today with the 1 wound on his left leg and the area on the left plantar first met head significantly smaller. Both look  healthy. He still has the drain in the left leg. He says he has to change this daily. Follows up with Dr. Donzetta Matters on January 11. 06/29/2021; the wounds that I am following on the left leg and left first met head continued to be quite healthy. However the area where his inferior drain is in place had copious amounts of drainage which was green in color. The wound here is larger. Follows up with Dr. Gwenlyn Saran of vein and vascular his surgeon next week as well as infectious disease. He remains on ciprofloxacin and doxycycline. He is not complaining of excessive pain in either one of the drain areas 1/12; the patient saw vascular surgery and infectious disease. Vascular surgery has left the drain in place as there was still some notable drainage still see him back in 2 weeks. Dr. Velna Ochs stop the doxycycline and ciprofloxacin and I do not believe he follows up with them at this point. Culture I did last week showed both doxycycline resistant MRSA and Pseudomonas not sensitive to ciprofloxacin although only in rare titers 1/19; the patient's wound on the left anterior lower leg is just about healed. We have continued healing of the area that was medially on the left leg. Left first plantar metatarsal head continues to get smaller. The major problem here is his 2 drain sites 1 on the left upper calf and lateral thigh. There is purulent drainage still from the left lateral thigh. I gave him antibiotics last week but we still have recultured. He has the drain in the area I think this is eventually going to have to come out. I suspect there will be a connecting wound to heal here perhaps with improved VAc 1/26; the patient had his drain removed by vein and vascular on 1/25/. This was a large pocket of fluid  in his left thigh that seem to tunnel into his left upper calf. 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 His mention his Penrose drain was removed today. He now has a tunneling wound on his left calf and left thigh. Both of these probe widely towards each other although I cannot really prove that they connect. Both wounds on his lower leg anteriorly are closed and his area over the first metatarsal head on his right foot continues to improve. We are using Hydrofera Blue here. He also saw infectious disease culture of the abscess they noted was polymicrobial with MRSA, Morganella and Klebsiella he was treated with doxycycline and ciprofloxacin for 4 weeks ending on 07/03/2021. They did not recommend any further antibiotics. Notable that while he still had the Penrose drain in place last week he had purulent drainage coming out of the inferior IandD site this grew [facility], MRSA and Pseudomonas but there does not appear to be any active infection in this area today with the drain out and he is not systemically unwell 2/2; with regards to the drain sites the superior one on the thigh actually is closed down the one on the upper left lateral calf measures about 8 and half centimeters which is an improvement seems to be less prominent although still with a lot of drainage. The only remaining wound is over the first metatarsal head on the left foot and this looks to be continuing to improve with Hydrofera Blue. 2/9; the area on his plantar left foot continues to contract. Callus around the wound edge. The drain sites specifically have not come down in depth. We put the wound VAC on  Monday he changed the canister late last night our intake nurse reported a pocket of fluid perhaps caused by our compression wraps 2/16; continued improvement in left foot plantar wound. drainage site in the calf is not improved in terms of depth (wound vac) 2/23; continued improvement in the left foot  wound over the first metatarsal head. With regards to the drain sites the area on his thigh laterally is healed however the open area on his calf is small in terms of circumference by still probes in by about 15 cm. Within using the wound VAC. Hydrofera Blue on his foot 08/24/2021: The left first metatarsal head wound continues to improve. The wound bed is healthy with just some surrounding callus. Unfortunately the open drain site on his calf remains open and tunnels at least 15 cm (the extent of a Q-tip). This is despite several weeks of wound VAC treatment. Based on reading back through the notes, there has been really no significant change in the depth of the wound, although the orifice is smaller and the more cranial wound on his thigh has closed. I suspect the tunnel tracks nearly all the way to this location. 08/31/2021: Continued improvement in the left first metatarsal head wound. There has been absolutely no improvement to the long tunnel from his open drain site on his calf. We have tried to get him into see vascular surgery sooner to consider the possibility of simply filleting the tract open and allowing it to heal from the bottom up, likely with a wound VAC. They have not yet scheduled a sooner appointment than his current mid April 09/14/2021: He was seen by vascular surgery and they took him to the operating room last week. They opened a portion of the tunnel, but did not extend the entire length of the known open subcutaneous tract. I read Dr. Claretha Cooper operative note and it is not clear from that documentation why only a portion of the tract was opened. The heaped up granulation tissue was curetted and removed from at least some portion of the tract. They did place a wound VAC and applied an Unna boot to the leg. The ulcer on his left first metatarsal head is smaller today. The bed looks good and there is just a small amount of surrounding callus. 09/21/2021: The ulcer on his left first  metatarsal head looks to be stalled. There is some callus surrounding the wound but the wound bed itself does not appear particularly dynamic. The tunnel tract on his lateral left leg seems to be roughly the same length or perhaps slightly smaller but the wound bed appears healthy with good granulation tissue. He opened up a new wound on his medial thigh and the site of a prior surgical incision. He says that he did this unconsciously in his sleep by scratching. 09/28/2021: Unfortunately, the ulcer on his left first metatarsal head has extended underneath the callus toward the dorsum of his foot. The medial thigh wounds are roughly the same. The tunnel on his lateral left leg continues to be problematic; it is longer than we are able to actually probe with a Q-tip. I am still not certain as to why Dr. Donzetta Matters did not open this up entirely when he took the patient to the operating room. We will likely be back in the same situation with just a small superficial opening in a long unhealed tract, as the open portion is granulating in nicely. 10/02/2021: The patient was initially scheduled for a nurse visit, but we  are also applying a total contact cast today. The plantar foot wound looks clean without significant accumulated callus. We have been applying Prisma silver collagen to the site. 10/05/2021: The patient is here for his first total contact cast change. We have tried using gauze packing strips in the tunnel on his lateral leg wound, but this does not seem to be working any better than the white VAC foam. The foot ulcer looks about the same with minimal periwound callus. Medial thigh wound is clean with just some overlying eschar. 10/12/2021: The plantar foot wound is stable without any significant accumulation of periwound callus. The surface is viable with good granulation tissue. The medial thigh wounds are much smaller and are epithelializing. On the other hand, he had purulent drainage coming from the  tunnel on his lateral leg. He does go back to see Dr. Donzetta Matters next week and is planning to ask him why the wound tunnel was not completely opened at the time of his most recent operation. 10/19/2021: The plantar foot wound is markedly improved and has epithelial tissue coming through the surface. The medial thigh wounds are nearly closed with just a tiny open area. He did see Dr. Donzetta Matters earlier this week and apparently they did discuss the possibility of opening the sinus tract further and enabling a wound VAC application. Apparently there are some limits as to what Dr. 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 I think if we could get the tract open to the level of the popliteal fossa, this would greatly aid in her ability to get this chart closed. That being said, however, today when I probed the tract with a Q-tip, I was not able to insert the entirety of the Q-tip as I have on previous occasions. The tunnel is shorter by about 4 cm. The surface is clean with good granulation tissue and no further episodes of purulent drainage. 10/30/2021: Last week, the patient underwent surgery and had the long tract in his leg opened. There was a rind that was debrided, according to the operative report. His medial thigh ulcers are closed. The plantar foot wound is clean with a good surface and some built up surrounding callus. 11/06/2021: The overall dimensions of the large wound on his lateral leg remain about the same, but there is good granulation tissue present and the tunneling is a little bit shorter. He has a new wound on his anterior tibial surface, in the same location where he had a similar lesion in the past. The plantar foot wound is clean with some buildup surrounding callus. Just toward the medial aspect of his foot, however, there is an area of darkening that once debrided, revealed another opening in the skin surface. 11/13/2021: The anterior tibial surface wound is  closed. The plantar foot wound has some surrounding callus buildup. The area of darkening that I debrided last week and revealed an opening in the skin surface has closed again. The tunnel in the large wound on his lateral leg has come in by about 3 cm. There is healthy granulation tissue on the entire wound surface. 11/23/2021: The patient was out of town last week and did wet-to-dry dressings on his large wound. 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 Unfortunately, he picked open the wound on his left medial thigh. He says that it was itching and he just could not stop scratching it until it was open again.  The wound on his plantar foot is smaller and has not accumulated a tremendous amount of callus. The lateral leg wound is shallower and the tunnel has also decreased in depth. There is just a little bit of slough accumulation on the surface. 11/30/2021: Another portion of his left medial thigh has been opened up. All of these wounds are fairly superficial with just a little bit of slough and eschar accumulation. The wound on his plantar foot is almost closed with just a bit of eschar and periwound callus accumulation. The lateral leg wound is nearly flush with the surrounding skin and the tunnel is markedly shallower. 12/07/2021: There is just 1 open area on his left medial thigh. It is clean with just a little bit of perimeter eschar. The wound on his plantar foot continues to contract and just has some eschar and periwound callus accumulation. The lateral leg wound is closing at the more distal aspect and the tunnel is smaller. The surface is nearly flush with the surrounding skin and it has a good bed of granulation tissue. Patient History Information obtained from Patient. Family History Diabetes - Mother, Heart Disease - Paternal Grandparents,Mother,Father,Siblings, Stroke - Father, No family history of Cancer, Hereditary  Spherocytosis, Hypertension, Kidney Disease, Lung Disease, Seizures, Thyroid Problems, Tuberculosis. Social History Former smoker - quit 1999, Marital Status - Married, Alcohol Use - Moderate, Drug Use - No History, Caffeine Use - Rarely. Medical History Eyes Patient has history of Glaucoma - both eyes Denies history of Cataracts, Optic Neuritis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

## 2021-12-11 ENCOUNTER — Encounter (HOSPITAL_BASED_OUTPATIENT_CLINIC_OR_DEPARTMENT_OTHER): Payer: Medicare Other | Admitting: General Surgery

## 2021-12-11 DIAGNOSIS — E11621 Type 2 diabetes mellitus with foot ulcer: Secondary | ICD-10-CM | POA: Diagnosis not present

## 2021-12-11 NOTE — Progress Notes (Signed)
[name], [name] ([id number]) Visit Report for 12/11/2021 SuperBill Details Patient Name: Date of Service: [name], [name] 12/11/2021 Medical Record Number: [id number] Patient Account Number: [id number] Date of Birth/Sex: Treating RN: 1951/06/09 (71 y.o. [name] Primary Care Provider: [name] Other Clinician: Referring Provider: Treating Provider/Extender: [name] in Treatment: 34 Diagnosis Coding ICD-10 Codes Code Description E11.621 Type 2 diabetes mellitus with foot ulcer 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 I89.0 Lymphedema, not elsewhere classified I87.322 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verity 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 Facility Procedures CPT4 Code Description Modifier Quantity [id number] 97605 - WOUND VAC-50 SQ CM OR LESS 1 ICD-10 Diagnosis Description 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verity [id number] (Facility Use Only) 29581LT - APPLY MULTLAY COMPRS LWR LT LEG 1 ICD-10 Diagnosis Description 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verity Electronic Signature(s) Signed: 12/11/2021 5:02:10 PM By: Fredirick Maudlin MD FACS Signed: 12/11/2021 5:25:20 PM By: Adline Peals Entered By: Adline Peals on 12/11/2021 14:44:20

## 2021-12-11 NOTE — Progress Notes (Signed)
[name], [name] ([id number]) Visit Report for 12/11/2021 Arrival Information Details Patient Name: Date of Service: [name], [name] 12/11/2021 2:00 PM Medical Record Number: [id number] Patient Account Number: [id number] Date of Birth/Sex: Treating RN: [birth year] (71 y.o. [name] Primary Care [name]: [name] Other Clinician: Referring [name]: Treating [name]/Extender: [name] in Treatment: 44 Visit Information History Since Last Visit Added or deleted any medications: No Patient Arrived: Ambulatory Any new allergies or adverse reactions: No Arrival Time: 14:09 Had a fall or experienced change in No Accompanied By: self activities of daily living that may affect Transfer Assistance: None risk of falls: Patient Identification Verified: Yes Signs or symptoms of abuse/neglect since last visito No Secondary Verification Process Completed: Yes Hospitalized since last visit: No Patient Requires Transmission-Based Precautions: No Implantable device outside of the clinic excluding No Patient Has Alerts: Yes cellular tissue based products placed in the center since last visit: Has Dressing in Place as Prescribed: Yes Pain Present Now: No Electronic Signature(s) Signed: 12/11/2021 5:25:20 PM By: [name] Entered By: [name] on 12/11/2021 14:11:11 -------------------------------------------------------------------------------- Compression Therapy Details Patient Name: Date of Service: [name]. 12/11/2021 2:00 PM Medical Record Number: [id number] Patient Account Number: [id number] Date of Birth/Sex: Treating RN: 1951-05-07 (71 y.o. [name] Primary Care [name]: [name] Other Clinician: Referring [name]: Treating [name]/Extender: [name] in Treatment: 34 Compression Therapy Performed for Wound Assessment: Wound #22 Left,Lateral Lower Leg Performed By:  Clinician [name] Peals, RN Compression Type: Double Layer Electronic Signature(s) Signed: 12/11/2021 5:25:20 PM By: [name] Entered By: [name] on 12/11/2021 14:16:22 -------------------------------------------------------------------------------- Encounter Discharge Information Details Patient Name: Date of Service: [name]. 12/11/2021 2:00 PM Medical Record Number: [id number] Patient Account Number: [id number] Date of Birth/Sex: Treating RN: 12-09-1950 (71 y.o. [name] Primary Care [name]: Other Clinician: Jilda [name] Referring [name]: Treating [name]/Extender: [name] in Treatment: 34 Encounter Discharge Information Items Discharge Condition: Stable Ambulatory Status: Ambulatory Discharge Destination: Home Transportation: Private Auto Accompanied By: self Schedule Follow-up Appointment: Yes Clinical Summary of Care: Patient Declined Electronic Signature(s) Signed: 12/11/2021 5:25:20 PM By: [name] Entered By: [name] on 12/11/2021 14:43:50 -------------------------------------------------------------------------------- Negative Pressure Wound Therapy Maintenance (NPWT) Details Patient Name: Date of Service: [name], [name] 12/11/2021 2:00 PM Medical Record Number: [id number] Patient Account Number: [id number] Date of Birth/Sex: Treating RN: [birth year] (71 y.o. [name] Primary Care [name]: [name] Other Clinician: Referring [name]: Treating [name]/Extender: [name] in Treatment: 34 NPWT Maintenance Performed for: Wound #22 Left, Lateral Lower Leg Additional Injuries Covered: No Performed By: [name] Peals, RN Type: VAC System Coverage Size (sq cm): 37.4 Pressure Type: Constant Pressure Setting: 125 mmHG Drain Type: None Sponge/Dressing Type: Combination : white and black Date Initiated:  07/27/2021 Dressing Removed: Yes Quantity of Sponges/Gauze Removed: 2 Canister Changed: Yes Dressing Reapplied: Yes Quantity of Sponges/Gauze Inserted: 2 Days On NPWT : 138 Electronic Signature(s) Signed: 12/11/2021 5:25:20 PM By: [name] Entered By: [name] on 12/11/2021 14:16:03 -------------------------------------------------------------------------------- Patient/Caregiver Education Details Patient Name: Date of Service: [name] 6/19/2023andnbsp2:00 PM Medical Record Number: [id number] Patient Account Number: [id number] Date of Birth/Gender: Treating RN: 09-25-1950 (71 y.o. [name] Primary Care Physician: [name] Other Clinician: Referring Physician: Treating Physician/Extender: [name] in Treatment: 65 Education Assessment Education Provided To: Patient Education Topics Provided Wound/Skin Impairment: Methods: Explain/Verbal Responses: Reinforcements needed, State content correctly [employer])  Signed: 12/11/2021 5:25:20 PM By: [name] By: [name] on 12/11/2021 14:43:39 -------------------------------------------------------------------------------- Wound Assessment Details Patient Name: Date of Service: [name], [name] 12/11/2021 2:00 PM Medical Record Number: [id number] Patient Account Number: [id number] Date of Birth/Sex: Treating RN: April 22, 1951 (71 y.o. [name] Primary Care [name]: [name] Other Clinician: Referring [name]: Treating [name]/Extender: [name] in Treatment: 34 Wound Status Wound Number: 18 Primary Diabetic Wound/Ulcer of the Lower Extremity Etiology: Wound Location: Left, Plantar Metatarsal head first Wound Open Wounding Event: Gradually Appeared Status: Date Acquired: 08/23/2020 Comorbid Glaucoma, Sleep Apnea, Hypertension, Peripheral Arterial Disease, Weeks Of Treatment:  34 History: Peripheral Venous Disease, Type II Diabetes, Gout, Osteoarthritis, Clustered Wound: No Neuropathy Wound Measurements Length: (cm) 0.2 Width: (cm) 0.2 Depth: (cm) 0.1 Area: (cm) 0.031 Volume: (cm) 0.003 % Reduction in Area: 99.8% % Reduction in Volume: 99.9% Epithelialization: Medium (34-66%) Wound Description Classification: Grade 2 Wound Margin: Thickened Exudate Amount: Small Exudate Type: Serosanguineous Exudate Color: red, brown Foul Odor After Cleansing: No Slough/Fibrino No Wound Bed Granulation Amount: Large (67-100%) Exposed Structure Granulation Quality: Red Fascia Exposed: No Necrotic Amount: Small (1-33%) Fat Layer (Subcutaneous Tissue) Exposed: Yes Necrotic Quality: Eschar Tendon Exposed: No Muscle Exposed: No Joint Exposed: No Bone Exposed: No Treatment Notes Wound #18 (Metatarsal head first) Wound Laterality: Plantar, Left Cleanser Soap and Water Discharge Instruction: May shower and wash wound with dial antibacterial soap and water prior to dressing change. Wound Cleanser Discharge Instruction: Cleanse the wound with wound cleanser prior to applying a clean dressing using gauze sponges, not tissue or cotton balls. Peri-Wound Care Triamcinolone 15 (g) Discharge Instruction: Use triamcinolone 15 (g) as directed Sween Lotion (Moisturizing lotion) Discharge Instruction: Apply moisturizing lotion as directed Topical Primary Dressing Promogran Prisma Matrix, 4.34 (sq in) (silver collagen) Discharge Instruction: Moisten collagen with saline or hydrogel Secondary Dressing Woven Gauze Sponge, Non-Sterile 4x4 in Discharge Instruction: Apply over primary dressing as directed. Secured With 52M Medipore H Soft Cloth Surgical T ape, 4 x 10 (in/yd) Discharge Instruction: Secure with tape as directed. Compression Wrap CoFlex TLC XL 2-layer Compression System 4x7 (in/yd) Discharge Instruction: Apply CoFlex 2-layer compression as directed. (alt for 4  layer) Compression Stockings Add-Ons Electronic Signature(s) Signed: 12/11/2021 5:25:20 PM By: [name] Entered By: [name] on 12/11/2021 14:15:21 -------------------------------------------------------------------------------- Wound Assessment Details Patient Name: Date of Service: [name], [name] 12/11/2021 2:00 PM Medical Record Number: [id number] Patient Account Number: [id number] Date of Birth/Sex: Treating RN: 10/15/1950 (71 y.o. [name] Primary Care Damarien Nyman: [name] Other Clinician: Referring Val Farnam: Treating Teresha Hanks/Extender: [name] in Treatment: 34 Wound Status Wound Number: 22 Primary Cyst Etiology: Wound Location: Left, Lateral Lower Leg Wound Open Wounding Event: Bump Status: Date Acquired: 06/03/2021 Comorbid Glaucoma, Sleep Apnea, Hypertension, Peripheral Arterial Disease, Weeks Of Treatment: 27 History: Peripheral Venous Disease, Type II Diabetes, Gout, Osteoarthritis, Clustered Wound: No Neuropathy Wound Measurements Length: (cm) 11 Width: (cm) 3.4 Depth: (cm) 0.2 Area: (cm) 29.374 Volume: (cm) 5.875 % Reduction in Area: -1681.3% % Reduction in Volume: -345.4% Epithelialization: Small (1-33%) Wound Description Classification: Full Thickness With Exposed Support Structures Wound Margin: Distinct, outline attached Exudate Amount: Large Exudate Type: Serosanguineous Exudate Color: red, brown Foul Odor After Cleansing: No Slough/Fibrino Yes Wound Bed Granulation Amount: Large (67-100%) Exposed Structure Granulation Quality: Red, Pink Fascia Exposed: No Necrotic Amount: Small (1-33%) Fat Layer (Subcutaneous Tissue) Exposed: Yes Necrotic Quality: Adherent Slough Tendon Exposed: No Muscle Exposed: No Joint Exposed: No Bone Exposed: No  Treatment Notes Wound #22 (Lower Leg) Wound Laterality: Left, Lateral Cleanser Soap and Water Discharge Instruction: May shower and wash  wound with dial antibacterial soap and water prior to dressing change. Wound Cleanser Discharge Instruction: Cleanse the wound with wound cleanser prior to applying a clean dressing using gauze sponges, not tissue or cotton balls. Peri-Wound Care Topical Primary Dressing VAC Secondary Dressing Secured With Compression Wrap Compression Stockings Add-Ons Electronic Signature(s) Signed: 12/11/2021 5:25:20 PM By: [name] Entered By: [name] on 12/11/2021 14:15:27 -------------------------------------------------------------------------------- Wound Assessment Details Patient Name: Date of Service: [name]. 12/11/2021 2:00 PM Medical Record Number: [id number] Patient Account Number: [id number] Date of Birth/Sex: Treating RN: 04-10-1951 (71 y.o. [name] Primary Care Leyla Soliz: [name] Other Clinician: Referring Madhav Mohon: Treating Tammara Massing/Extender: [name] in Treatment: 34 Wound Status Wound Number: 24R Primary Abrasion Etiology: Wound Location: Left, Medial Upper Leg Wound Open Wounding Event: Trauma Status: Date Acquired: 09/14/2021 Comorbid Glaucoma, Sleep Apnea, Hypertension, Peripheral Arterial Disease, Weeks Of Treatment: 11 History: Peripheral Venous Disease, Type II Diabetes, Gout, Osteoarthritis, Clustered Wound: Yes Neuropathy Wound Measurements Length: (cm) 0.5 Width: (cm) 0.6 Depth: (cm) 0.1 Clustered Quantity: 3 Area: (cm) 0.236 Volume: (cm) 0.024 % Reduction in Area: 85.7% % Reduction in Volume: 85.5% Epithelialization: Medium (34-66%) Wound Description Classification: Full Thickness Without Exposed Support Structures Wound Margin: Distinct, outline attached Exudate Amount: Medium Exudate Type: Serosanguineous Exudate Color: red, brown Foul Odor After Cleansing: No Slough/Fibrino Yes Wound Bed Granulation Amount: Large (67-100%) Exposed Structure Granulation Quality: Red,  Hyper-granulation Fascia Exposed: No Necrotic Amount: Small (1-33%) Fat Layer (Subcutaneous Tissue) Exposed: Yes Necrotic Quality: Eschar Tendon Exposed: No Muscle Exposed: No Joint Exposed: No Bone Exposed: No Treatment Notes Wound #24R (Upper Leg) Wound Laterality: Left, Medial Cleanser Wound Cleanser Discharge Instruction: Cleanse the wound with wound cleanser prior to applying a clean dressing using gauze sponges, not tissue or cotton balls. Peri-Wound Care Sween Lotion (Moisturizing lotion) Discharge Instruction: Apply moisturizing lotion as directed Topical Primary Dressing KerraCel Ag Gelling Fiber Dressing, 2x2 in (silver alginate) Discharge Instruction: Apply silver alginate to wound bed as instructed Secondary Dressing Bordered Gauze, 4x4 in Discharge Instruction: Apply over primary dressing as directed. Secured With Elastic Bandage 4 inch (ACE bandage) Discharge Instruction: Secure with ACE bandage as directed. Compression Wrap Compression Stockings Add-Ons Electronic Signature(s) Signed: 12/11/2021 5:25:20 PM By: [name] Entered By: [name] on 12/11/2021 14:15:34 -------------------------------------------------------------------------------- Vitals Details Patient Name: Date of Service: [name]. 12/11/2021 2:00 PM Medical Record Number: [id number] Patient Account Number: [id number] Date of Birth/Sex: Treating RN: Dec 17, 1950 (71 y.o. [name] Primary Care Jameca Chumley: [name] Other Clinician: Referring Emmalia Heyboer: Treating Therma Lasure/Extender: [name] in Treatment: 34 Vital Signs Time Taken: 14:13 Temperature (F): 98.3 Height (in): 74 Pulse (bpm): 77 Weight (lbs): 238 Respiratory Rate (breaths/min): 18 Body Mass Index (BMI): 30.6 Blood Pressure (mmHg): 152/80 Reference Range: 80 - 120 mg / dl Electronic Signature(s) Signed: 12/11/2021 5:25:20 PM By: [name] Entered By:  [name] on 12/11/2021 14:13:40

## 2021-12-13 ENCOUNTER — Encounter: Payer: Self-pay | Admitting: Nurse Practitioner

## 2021-12-13 ENCOUNTER — Ambulatory Visit (INDEPENDENT_AMBULATORY_CARE_PROVIDER_SITE_OTHER): Payer: Medicare Other | Admitting: Nurse Practitioner

## 2021-12-13 VITALS — BP 134/83 | HR 60 | Ht 73.5 in | Wt 267.0 lb

## 2021-12-13 DIAGNOSIS — Z794 Long term (current) use of insulin: Secondary | ICD-10-CM | POA: Diagnosis not present

## 2021-12-13 DIAGNOSIS — I10 Essential (primary) hypertension: Secondary | ICD-10-CM | POA: Diagnosis not present

## 2021-12-13 DIAGNOSIS — E782 Mixed hyperlipidemia: Secondary | ICD-10-CM | POA: Diagnosis not present

## 2021-12-13 DIAGNOSIS — E1165 Type 2 diabetes mellitus with hyperglycemia: Secondary | ICD-10-CM

## 2021-12-13 LAB — POCT UA - MICROALBUMIN
Albumin/Creatinine Ratio, Urine, POC: <30
Creatinine, POC: 100 mg/dL
Microalbumin Ur, POC: 30 mg/L
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
## 2021-12-14 ENCOUNTER — Encounter (HOSPITAL_BASED_OUTPATIENT_CLINIC_OR_DEPARTMENT_OTHER): Payer: Medicare Other | Admitting: General Surgery

## 2021-12-14 DIAGNOSIS — E11621 Type 2 diabetes mellitus with foot ulcer: Secondary | ICD-10-CM | POA: Diagnosis not present

## 2021-12-14 NOTE — Progress Notes (Addendum)
ELGAR, SCOGGINS ([id number]) Visit Report for 12/14/2021 Chief Complaint Document Details Patient Name: Date of Service: MONTAY, VANVOORHIS 12/14/2021 7:45 A M Medical Record Number: [id number] Patient Account Number: [id number] Date of Birth/Sex: Treating RN: 12-Sep-1950 (71 y.o. [name] Primary Care Provider: [name] Other Clinician: Referring Provider: Treating Provider/Extender: [name] in Treatment: 71 Information Obtained from: Patient Chief Complaint Left leg and foot ulcers 04/12/2021; patient is here for wounds on his left lower leg and left plantar foot over the first metatarsal head Electronic Signature(s) Signed: 12/14/2021 8:17:58 AM By: Fredirick Maudlin MD FACS Entered By: Fredirick Maudlin on 12/14/2021 08:17:58 -------------------------------------------------------------------------------- HPI Details Patient Name: Date of Service: [name] 12/14/2021 7:45 A M Medical Record Number: [id number] Patient Account Number: [id number] Date of Birth/Sex: Treating RN: 05/21/1951 (71 y.o. [name] Primary Care Provider: [name] Other Clinician: Referring Provider: Treating Provider/Extender: [name] in Treatment: 35 History of Present Illness HPI Description: 10/11/17; Mr. Riera is a 71 year old man who tells me that in 2015 he slipped down the latter traumatizing his left leg. He developed a wound in the same spot the area that we are currently looking at. He states this closed over for the most part although he always felt it was somewhat unstable. In 2016 he hit the same area with the door of his car had this reopened. He tells me that this is never really closed although sometimes an inflow it remains open on a constant basis. He has not been using any specific dressing to this except for topical antibiotics the nature of which were not really sure. His primary doctor did send him to  see Dr. [name] of interventional cardiology. He underwent an angiogram on 08/06/17 and he underwent a PTA and directional atherectomy of the lesser distal SFA and popliteal arteries which resulted in brisk improvement in blood flow. It was noted that he had 2 vessel runoff through the anterior tibial and peroneal. He is also been to see vascular and interventional radiologist. He was not felt to have any significant superficial venous insufficiency. Presumably is not a candidate for any ablation. It was suggested he come here for wound care. The patient is a type II diabetic on insulin. He also has a history of venous insufficiency. ABIs on the left were noncompressible in our clinic 10/21/17; patient we admitted to the clinic last week. 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 We put Iodosorb on him after an aggressive debridement and 3 layer compression. He complained of pain in his ankle and itching with is skin in fact he scratched the area on the medial calf superiorly at the rim of our wraps and he has 2 small open areas in that location today which are new. I changed his primary dressing today to silver collagen. As noted he is already had revascularization and does not have any significant superficial venous insufficiency that would be amenable to ablation 10/28/17; patient admitted to the clinic 2 weeks ago. He has a smaller Wound. Scratch injury from last week revealed. There is large wound over the tibial area. This is smaller. Granulation looks healthy. No need for debridement. 11/04/17; the wound on the left lateral calf looks better. Improved dimensions. Surface of this looks better. We've been maintaining him and Kerlix Coban wraps. He finds this much more comfortable. Silver collagen dressing 11/11/17; left lateral Wound continues  to look healthy be making progress. Using a #5 curet I removed removed nonviable skin from the  surface of the wound and then necrotic debris from the wound surface. Surface of the wound continues to look healthy. He also has an open area on the left great toenail bed. We've been using topical antibiotics. 11/19/17; left anterior lateral wound continues to look healthy but it's not closed. He also had a small wound above this on the left leg Initially traumatic wounds in the setting of significant chronic venous insufficiency and stasis dermatitis 11/25/17; left anterior wounds superiorly is closed still a small wound inferiorly. 12/02/17; left anterior tibial area. Arrives today with adherent callus. Post debridement clearly not completely closed. Hydrofera Blue under 3 layer compression. 12/09/17; left anterior tibia. Circumferential eschar however the wound bed looks stable to improved. We've been using Hydrofera Blue under 3 layer compression 12/17/17; left anterior tibia. Apparently this was felt to be closed however when the wrap was taken off there is a skin tear to reopen wounds in the same area we've been using Hydrofera Blue under 3 layer compression 12/23/17 left anterior tibia. Not close to close this week apparently the [facility] was stuck to this again. Still circumferential eschar requiring debridement. I put a contact layer on this this time under the Hydrofera Blue 12/31/17; left anterior tibia. Wound is better slight amount of hyper-granulation. Using Hydrofera Blue over Adaptic. 01/07/18; left anterior tibia. The wound had some surface eschar however after this was removed he has no open wound.he was already revascularized by Dr. [name] when he came to our clinic with atherectomy of the left SFA and popliteal artery. He was also sent to interventional radiology for venous reflux studies. He was not felt to have significant reflux but certainly has chronic venous changes of his skin with hemosiderin deposition around this area. He will definitely need to lubricate his skin and wear  compression stocking and I've talked to him about this. READMISSION 05/26/2018 This is a now 71 year old man we cared for with traumatic wounds on his left anterior lower extremity. He had been previously revascularized during that admission by Dr. [name]. Apparently in follow-up Dr. [name] noted that he had deterioration in his arterial status. He underwent a stent placement in the distal left SFA on 04/22/2018. 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 He went back to the angiography suite on 04/30/2018 he underwent PTA and balloon angioplasty of the occluded left mid anterior tibial artery, thrombotic occlusion went from 100 to 0% which reconstitutes the posterior tibial artery. He had thrombectomy and aspiration of the peroneal artery. The stent placed in the distal SFA left SFA was still occluded. 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 He has been recuperating at [facility] after the most recent hospitalization. He is going home tomorrow. He tells me that 2 weeks ago he traumatized the tip of his left fifth toe. He came in urgently for our review of this. This was a history of before I noted that Dr. [name] had already noted dry gangrenous changes of the left fifth toe 06/09/2018; 2-week follow-up. I did contact Dr. [name] after his last appointment and he apparently saw 1 of Dr. Irven Shelling colleagues  the next day. He does not follow-up with Dr. [name] himself until Thursday of this week. 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 Nevertheless there is no evidence of infection no drainage and no pain. He had a new area that this week when we were signing him in today on the left anterior mid tibia  area, this is in close proximity to the previous wound we have dealt with in this clinic. 06/23/2018; 2-week follow-up. I did not receive a recent note from Dr. [name] to review today. Our office is trying to obtain this. He is apparently not planning to do further vascular interventions and wondered about compression to try and help with the patient's chronic venous insufficiency. However we are also concerned about the arterial flow. He arrives in clinic today with a new area on the left third toe. The areas on the calf/anterior tibia are close to closing. The left fifth toe is still mummified using Betadine. -In reviewing things with the patient he has what sounds like claudication with mild to moderate amount of activity. 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 I prescribed Levaquin over the phone while we attempted to arrange a plan of care. However the patient called yesterday to report he had low-grade fever and he came in today acutely. There is been a marked deterioration in the left third toe with spreading cellulitis up into the dorsal left foot. He was referred to the emergency room. Readmission: 06/29/2020 patient presents today for reevaluation here in our clinic he was previously treated by Dr. Dellia Nims at the latter part of 2019 in 2 the beginning of 2020. Subsequently we have not seen him since that time in the interim he did have evaluation with vein and vascular specialist specifically Dr. Anice Paganini who did perform quite extensive work for a left femoral to anterior tibial artery bypass. With that being said in the interim the patient has developed significant lymphedema and has wounds that he tells me have really never healed in regard to the incision site on the left leg. He also has multiple wounds on the feet for various reasons some of which is that he tends to pick at his feet. Fortunately there is no signs of active infection systemically at  this time he does have some wounds that are little bit deeper but most are fairly superficial he seems to have good blood flow and overall everything appears to be healthy I see no bone exposed and no obvious signs of osteomyelitis. I do not know that he necessarily needs a x-ray at this point although that something we could consider depending on how things progress. The patient does have a history of lymphedema, diabetes, this is type II, chronic kidney disease stage III, hypertension, and history of peripheral vascular disease. 07/05/2020; patient admitted last week. Is a patient I remember from 2019 he had a spreading infection involving the left foot and we sent him to the hospital. 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 He subsequently had a left femoral to anterior tibial bypass by Dr.Cain vein and vascular. He also has severe lymphedema with chronic skin changes related to that on the left leg. The most problematic area that was new today was on the left medial great toe. This was apparently a small area last week there was purulent drainage which our intake nurse cultured. Also areas on the  left medial foot and heel left lateral foot. He has 2 areas on the left medial calf left lateral calf in the setting of the severe lymphedema. 07/13/2020 on evaluation today patient appears to be doing better in my opinion compared to his last visit. The good news is there is no signs of active infection systemically and locally I do not see any signs of infection either. He did have an x-ray which was negative that is great news he had a culture which showed MRSA but at the same time he is been on the doxycycline which has helped. I do think we may want to extend this for 7 additional days 1/25; patient admitted to the clinic a few weeks ago. 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 We have been putting him under  compression his edema control is a lot better but he is severe verricused skin on the left leg. He is really done quite well he still has an open area on the left medial calf and the left medial first metatarsal head. We have been using silver collagen on the leg silver alginate on the foot 07/27/2020 upon evaluation today patient appears to be doing decently well in regard to his wounds. He still has a lot of dry skin on the left leg. Some of this is starting to peel back and I think he may be able to have them out by removing some that today. Fortunately there is no signs of active infection at this time on the left leg although on the right leg he does appear to have swelling and erythema as well as some mild warmth to touch. This does have been concerned about the possibility of cellulitis although within the differential diagnosis I do think that potentially a DVT has to be at least considered. We need to rule that out before proceeding would just call in the cellulitis. Especially since he is having pain in the posterior aspect of his calf muscle. 2/8; the patient had seen sparingly. He has severe skin changes of chronic lymphedema in the left leg thickened hyperkeratotic verrucous skin. He has an open wound on the medial part of the left first met head left mid tibia. He also has a rim of nonepithelialized skin in the anterior mid tibia. He brought in the AmLactin lotion that was been prescribed although I am not sure under compression and its utility. There concern about cellulitis on the right lower leg the last time he was here. He was put on on antibiotics. His DVT rule out was negative. The right leg looks fine he is using his stocking on this area 08/10/2020 upon evaluation today patient appears to be doing well with regard to his leg currently. He has been tolerating the dressing changes without complication. Fortunately there is no signs of active infection which is great news. Overall very  pleased with where things stand. 2/22; the patient still has an area on the medial part of the left first met his head. This looks better than when I last saw this earlier this month he has a rim of epithelialization but still some surface debris. Mostly everything on the left leg is healed. There is still a vulnerable in the left mid tibia area. 08/30/2020 upon evaluation today patient appears to be doing much better in regard to his wounds on his foot. Fortunately there does not appear to be any signs of active infection systemically though locally we did culture  this last week and it does appear that he does have MRSA currently. Nonetheless I think we will address that today I Minna send in a prescription for him in that regard. Overall though there does not appear to be any signs of significant worsening. 09/07/2020 on evaluation today patient's wounds over his left foot appear to be doing excellent. I do not see any signs of infection there is some callus buildup this can require debridement for certain but overall I feel like he is managing quite nicely. He still using the AmLactin cream which has been beneficial for him as well. 3/22; left foot wound is closed. There is no open area here. He is using ammonium lactate lotion to the lower extremities to help exfoliate dry cracked skin. He has compression stockings from elastic therapy in [place]. The wound on the medial part of his left first met head is healed today. READMISSION 04/12/2021 Mr. Human is a patient we know fairly well he had a prolonged stay in clinic in 2019 with wounds on his left lateral and left anterior lower extremity in the setting of chronic venous insufficiency. More recently he was here earlier this year with predominantly an area on his left foot first metatarsal head plantar and he says the plantar foot broke down on its not long after we discharged him but he did not come back here. The last few months areas of broken  down on his left anterior and again the left lateral lower extremity. The leg itself is very swollen chronically enlarged a lot of hyperkeratotic dry Berry Q skin in the left lower leg. His edema extends well into the thigh. He was seen by Dr. Donzetta Matters. He had ABIs on 03/02/2021 showing an ABI on the right of 1 with a TBI of 0.72 his ABI in the left at 1.09 TBI of 0.99. Monophasic and biphasic waveforms on the right. On the left monophasic waveforms were noted he went on to have an angiogram on 03/27/2021 this showed the aortic aortic and iliac segments were free of flow-limiting stenosis the left common femoral vein to evaluate the left femoral to anterior tibial artery bypass was unobstructed the bypass was patent without any areas of stenosis. We discharged the patient in bilateral juxta lite stockings but very clearly that was not sufficient to control the swelling and maintain skin integrity. He is clearly going to need compression pumps. 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 This is fortunate because he is on his feet for long periods of time. 10/27; patient comes in with our intake nurse reporting copious amount of green drainage from the left anterior mid tibia the left dorsal foot and to a lesser extent the left medial mid tibia. We left the compression wrap on all week for the amount of edema in his left leg is quite a bit better. We use silver alginate as the primary dressing 11/3; edema control is good. Left anterior lower leg left medial lower leg and the plantar first metatarsal head. The left anterior lower leg required debridement. Deep tissue culture I did of this wound showed MRSA I put him on 10 days of doxycycline which she will start today. We have him in compression wraps. He has a security card and [employer] however he is retiring on November 15. We will need to then get him into a better offloading boot for the left foot perhaps a  total contact cast 11/10; edema control is quite good. Left anterior and left medial lower leg wounds in the setting of chronic venous insufficiency and lymphedema. He also has a substantial area over the left plantar first metatarsal head. 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 He has significant hypergranulation on the left plantar foot wound. The patient is a diabetic but he does not have significant PAD 11/17; edema control is quite good. Left anterior and left medial lower leg wounds look better. The really concerning area remains the area on the left plantar first metatarsal head. He has a rim of epithelialization. 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 Starting today with a forefoot off loader but . possibly a total contact cast. 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 Arrives with a very significant odor from I think all of the wounds on the left leg and the left foot. Because of this I did not put a total contact cast on him today but will could still consider this. His wife was having cataract surgery which is the reason he missed the appointment 12/6. I saw this man 5 days ago with a swelling below the popliteal fossa. I thought he actually might have a Baker's cyst however the DVT rule out study that we could arrange right away was negative the technician told me this was not a ruptured Baker's cyst. We attempted to get this aspirated by under ultrasound guidance in interventional radiology however all they did was an ultrasound however it shows an extensive fluid collection 62 x 8 x 9.4 in the left thigh and left calf. The patient states he thinks this started 8 days  ago or so but he really is not complaining of any pain, fever or systemic symptoms. He has not ha 12/20; after some difficulty I managed to get the patient into see Dr. Donzetta Matters. Eventually he was taken into the hospital and had a drain put in the fluid collection below his left knee posteriorly extending into the posterior thigh. He still has the drain in place. Culture of this showed moderate staff aureus few Morganella and few Klebsiella he is now on doxycycline and ciprofloxacin as suggested by infectious disease he is on this for a month. The drain will remain in place until it stops draining 12/29; he comes in today with the 1 wound on his left leg and the area on the left plantar first met head significantly smaller. Both look healthy. He still has the drain in the left leg. He says he has to change this daily. Follows up with Dr. Donzetta Matters on January 11. 06/29/2021; the wounds that I am following on the left leg and left first met head continued to be quite healthy. However the area where his inferior drain is in place had copious amounts of drainage which was green in color. The wound here is larger. Follows up with Dr. Gwenlyn Saran of vein and vascular his surgeon next week as well as infectious disease. He remains on ciprofloxacin and doxycycline. He is not complaining of excessive pain in either one of the drain areas 1/12; the patient saw vascular surgery and  infectious disease. Vascular surgery has left the drain in place as there was still some notable drainage still see him back in 2 weeks. Dr. Velna Ochs stop the doxycycline and ciprofloxacin and I do not believe he follows up with them at this point. Culture I did last week showed both doxycycline resistant MRSA and Pseudomonas not sensitive to ciprofloxacin although only in rare titers 1/19; the patient's wound on the left anterior lower leg is just about healed. We have continued healing of the area that was medially on the left leg. Left first plantar  metatarsal head continues to get smaller. The major problem here is his 2 drain sites 1 on the left upper calf and lateral thigh. There is purulent drainage still from the left lateral thigh. I gave him antibiotics last week but we still have recultured. He has the drain in the area I think this is eventually going to have to come out. I suspect there will be a connecting wound to heal here perhaps with improved VAc 1/26; the patient had his drain removed by vein and vascular on 1/25/. This was a large pocket of fluid in his left thigh that seem to tunnel into his left upper calf. 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 His mention his Penrose drain was removed today. He now has a tunneling wound on his left calf and left thigh. Both of these probe widely towards each other although I cannot really prove that they connect. Both wounds on his lower leg anteriorly are closed and his area over the first metatarsal head on his right foot continues to improve. We are using Hydrofera Blue here. He also saw infectious disease culture of the abscess they noted was polymicrobial with MRSA, Morganella and Klebsiella he was treated with doxycycline and ciprofloxacin for 4 weeks ending on 07/03/2021. They did not recommend any further antibiotics. Notable that while he still had the Penrose drain in place last week he had purulent drainage coming out of the inferior IandD site this grew [facility], MRSA and Pseudomonas but there does not appear to be any active infection in this area today with the drain out and he is not systemically unwell 2/2; with regards to the drain sites the superior one on the thigh actually is closed down the one on the upper left lateral calf measures about 8 and half centimeters which is an improvement seems to be less prominent although still with a lot of drainage. The only remaining wound is over the first metatarsal head on the left foot and this looks to be  continuing to improve with Hydrofera Blue. 2/9; the area on his plantar left foot continues to contract. Callus around the wound edge. The drain sites specifically have not come down in depth. We put the wound VAC on Monday he changed the canister late last night our intake nurse reported a pocket of fluid perhaps caused by our compression wraps 2/16; continued improvement in left foot plantar wound. drainage site in the calf is not improved in terms of depth (wound vac) 2/23; continued improvement in the left foot wound over the first metatarsal head. With regards to the drain sites the area on his thigh laterally is healed however the open area on his calf is small in terms of circumference by still probes in by about 15 cm. Within using the wound VAC. Hydrofera Blue on his foot 08/24/2021: The left first metatarsal head wound continues to improve. The wound bed is  healthy with just some surrounding callus. Unfortunately the open drain site on his calf remains open and tunnels at least 15 cm (the extent of a Q-tip). This is despite several weeks of wound VAC treatment. Based on reading back through the notes, there has been really no significant change in the depth of the wound, although the orifice is smaller and the more cranial wound on his thigh has closed. I suspect the tunnel tracks nearly all the way to this location. 08/31/2021: Continued improvement in the left first metatarsal head wound. There has been absolutely no improvement to the long tunnel from his open drain site on his calf. We have tried to get him into see vascular surgery sooner to consider the possibility of simply filleting the tract open and allowing it to heal from the bottom up, likely with a wound VAC. They have not yet scheduled a sooner appointment than his current mid April 09/14/2021: He was seen by vascular surgery and they took him to the operating room last week. They opened a portion of the tunnel, but did not extend  the entire length of the known open subcutaneous tract. I read Dr. Claretha Cooper operative note and it is not clear from that documentation why only a portion of the tract was opened. The heaped up granulation tissue was curetted and removed from at least some portion of the tract. They did place a wound VAC and applied an Unna boot to the leg. The ulcer on his left first metatarsal head is smaller today. The bed looks good and there is just a small amount of surrounding callus. 09/21/2021: The ulcer on his left first metatarsal head looks to be stalled. There is some callus surrounding the wound but the wound bed itself does not appear particularly dynamic. The tunnel tract on his lateral left leg seems to be roughly the same length or perhaps slightly smaller but the wound bed appears healthy with good granulation tissue. He opened up a new wound on his medial thigh and the site of a prior surgical incision. He says that he did this unconsciously in his sleep by scratching. 09/28/2021: Unfortunately, the ulcer on his left first metatarsal head has extended underneath the callus toward the dorsum of his foot. The medial thigh wounds are roughly the same. The tunnel on his lateral left leg continues to be problematic; it is longer than we are able to actually probe with a Q-tip. I am still not certain as to why Dr. Donzetta Matters did not open this up entirely when he took the patient to the operating room. We will likely be back in the same situation with just a small superficial opening in a long unhealed tract, as the open portion is granulating in nicely. 10/02/2021: The patient was initially scheduled for a nurse visit, but we are also applying a total contact cast today. The plantar foot wound looks clean without significant accumulated callus. We have been applying Prisma silver collagen to the site. 10/05/2021: The patient is here for his first total contact cast change. We have tried using gauze packing strips in  the tunnel on his lateral leg wound, but this does not seem to be working any better than the white VAC foam. The foot ulcer looks about the same with minimal periwound callus. Medial thigh wound is clean with just some overlying eschar. 10/12/2021: The plantar foot wound is stable without any significant accumulation of periwound callus. The surface is viable with good granulation tissue. The medial thigh  wounds are much smaller and are epithelializing. On the other hand, he had purulent drainage coming from the tunnel on his lateral leg. He does go back to see Dr. Donzetta Matters next week and is planning to ask him why the wound tunnel was not completely opened at the time of his most recent operation. 10/19/2021: The plantar foot wound is markedly improved and has epithelial tissue coming through the surface. The medial thigh wounds are nearly closed with just a tiny open area. He did see Dr. Donzetta Matters earlier this week and apparently they did discuss the possibility of opening the sinus tract further and enabling a wound VAC application. Apparently there are some limits as to what Dr. 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 I think if we could get the tract open to the level of the popliteal fossa, this would greatly aid in her ability to get this chart closed. That being said, however, today when I probed the tract with a Q-tip, I was not able to insert the entirety of the Q-tip as I have on previous occasions. The tunnel is shorter by about 4 cm. The surface is clean with good granulation tissue and no further episodes of purulent drainage. 10/30/2021: Last week, the patient underwent surgery and had the long tract in his leg opened. There was a rind that was debrided, according to the operative report. His medial thigh ulcers are closed. The plantar foot wound is clean with a good surface and some built up surrounding callus. 11/06/2021: The overall dimensions of the large  wound on his lateral leg remain about the same, but there is good granulation tissue present and the tunneling is a little bit shorter. He has a new wound on his anterior tibial surface, in the same location where he had a similar lesion in the past. The plantar foot wound is clean with some buildup surrounding callus. Just toward the medial aspect of his foot, however, there is an area of darkening that once debrided, revealed another opening in the skin surface. 11/13/2021: The anterior tibial surface wound is closed. The plantar foot wound has some surrounding callus buildup. The area of darkening that I debrided last week and revealed an opening in the skin surface has closed again. The tunnel in the large wound on his lateral leg has come in by about 3 cm. There is healthy granulation tissue on the entire wound surface. 11/23/2021: The patient was out of town last week and did wet-to-dry dressings on his large wound. 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 Unfortunately, he picked open the wound on his left medial thigh. He says that it was itching and he just could not stop scratching it until it was open again. The wound on his plantar foot is smaller and has not accumulated a tremendous amount of callus. The lateral leg wound is shallower and the tunnel has also decreased in depth. There is just a little bit of slough accumulation on the surface. 11/30/2021: Another portion of his left medial thigh has been opened up. All of these wounds are fairly superficial with just a little bit of slough and eschar accumulation. The wound on his plantar foot is almost closed with just a bit of eschar and periwound callus accumulation. The lateral leg wound is nearly flush with the surrounding skin and the tunnel is markedly shallower. 12/07/2021: There is just 1 open area on his  left medial thigh. It is clean with just a little bit of  perimeter eschar. The wound on his plantar foot continues to contract and just has some eschar and periwound callus accumulation. The lateral leg wound is closing at the more distal aspect and the tunnel is smaller. The surface is nearly flush with the surrounding skin and it has a good bed of granulation tissue. 12/14/2021: The thigh and foot wounds are closed. The lateral leg wound has closed over approximately half of its length. The tunnel continues to contract and the surface is now flush with the surrounding skin. The wound bed has robust granulation tissue. Electronic Signature(s) Signed: 12/14/2021 8:18:51 AM By: Fredirick Maudlin MD FACS Entered By: Fredirick Maudlin on 12/14/2021 08:18:51 -------------------------------------------------------------------------------- Physical Exam Details Patient Name: Date of Service: [name] 12/14/2021 7:45 A M Medical Record Number: [id number] Patient Account Number: [id number] Date of Birth/Sex: Treating RN: 1950/08/19 (71 y.o. [name] Primary Care Provider: [name] Other Clinician: Referring Provider: Treating Provider/Extender: [name] in Treatment: 35 Constitutional . . . . No acute distress.Marland Kitchen Respiratory Normal work of breathing on room air.. Notes 12/14/2021: The thigh and foot wounds are closed. The lateral leg wound has closed over approximately half of its length. The tunnel continues to contract and the surface is now flush with the surrounding skin. The wound bed has robust granulation tissue. Electronic Signature(s) Signed: 12/14/2021 8:19:18 AM By: Fredirick Maudlin MD FACS Entered By: Fredirick Maudlin on 12/14/2021 08:19:18 -------------------------------------------------------------------------------- Physician Orders Details Patient Name: Date of Service: [name] 12/14/2021 7:45 A M Medical Record Number: [id number] Patient Account Number: [id number] Date of  Birth/Sex: Treating RN: 1951/03/21 (71 y.o. [name] Primary Care Provider: [name] Other Clinician: Referring Provider: Treating Provider/Extender: [name] in Treatment: 73 Verbal / Phone Orders: No Diagnosis Coding ICD-10 Coding Code Description E11.621 Type 2 diabetes mellitus with foot ulcer 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 I89.0 Lymphedema, not elsewhere classified I87.322 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verity Follow-up Appointments ppointment in 1 week. - Dr Celine Ahr - Room 2 - 6/30 at 8:30 Return A Nurse Visit: - Monday 6/26 at 11:30 Bathing/ Shower/ Hygiene May shower with protection but do not get wound dressing(s) wet. - Use a cast protector so you can shower without getting your wrap(s) wet Negative Presssure Wound Therapy Wound #22 Left,Lateral Lower Leg Wound Vac to wound continuously at 155m/hg pressure - change 2 times per week in clinic Black Foam White Foam Edema Control - Lymphedema / SCD / Other Elevate legs to the level of the heart or above for 30 minutes daily and/or when sitting, a frequency of: - throughout the day Avoid standing for long periods of time. Patient to wear own compression stockings every day. - on right leg; Moisturize legs daily. - Ammonium lactate to right leg daily Wound Treatment Wound #22 - Lower Leg Wound Laterality: Left, Lateral Cleanser: Soap and Water 1 x Per Week/30 Days Discharge Instructions: May shower and wash wound with dial antibacterial soap and water prior to dressing change. Cleanser: Wound Cleanser 1 x Per Week/30 Days Discharge Instructions: Cleanse the wound with wound cleanser prior to applying a clean dressing using gauze sponges, not tissue or cotton balls. Peri-Wound Care:  Triamcinolone 15 (g) 1 x Per Week/30 Days Discharge Instructions: Use  triamcinolone 15 (g) as directed Peri-Wound Care: Sween Lotion (Moisturizing lotion) 1 x Per Week/30 Days Discharge Instructions: Apply moisturizing lotion as directed Prim Dressing: VAC ary 1 x Per Week/30 Days Compression Wrap: CoFlex TLC XL 2-layer Compression System 4x7 (in/yd) 1 x Per Week/30 Days Discharge Instructions: Apply CoFlex 2-layer compression as directed. (alt for 4 layer) Electronic Signature(s) Signed: 12/14/2021 9:49:43 AM By: Fredirick Maudlin MD FACS Entered By: Fredirick Maudlin on 12/14/2021 08:19:35 -------------------------------------------------------------------------------- Problem List Details Patient Name: Date of Service: [name] 12/14/2021 7:45 A M Medical Record Number: [id number] Patient Account Number: [id number] Date of Birth/Sex: Treating RN: February 14, 1951 (71 y.o. [name] Primary Care Provider: [name] Other Clinician: Referring Provider: Treating Provider/Extender: [name] in Treatment: 68 Active Problems ICD-10 Encounter Code Description Active Date MDM Diagnosis E11.621 Type 2 diabetes mellitus with foot ulcer 04/12/2021 No Yes 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 04/12/2021 No Yes I89.0 Lymphedema, not elsewhere classified 04/12/2021 No Yes I87.322 Chronic venous hypertension (idiopathic) with inflammation of left lower 04/12/2021 No Yes extremity 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 ulcer of other part of left lower leg with other specified 04/12/2021 No Yes severity Inactive Problems ICD-10 Code Description Active Date Inactive Date E11.42 Type 2 diabetes mellitus with diabetic polyneuropathy 04/12/2021 04/12/2021 L02.416 Cutaneous abscess of left lower limb 06/13/2021 06/13/2021 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 of other part of left foot with other specified severity  04/12/2021 04/12/2021 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 07/20/2021 07/20/2021 Resolved Problems Electronic Signature(s) Signed: 12/14/2021 8:17:11 AM By: Fredirick Maudlin MD FACS Entered By: Fredirick Maudlin on 12/14/2021 08:17:11 -------------------------------------------------------------------------------- Progress Note Details Patient Name: Date of Service: [name] 12/14/2021 7:45 A M Medical Record Number: [id number] Patient Account Number: [id number] Date of Birth/Sex: Treating RN: 1951-01-15 (71 y.o. [name] Primary Care Provider: [name] Other Clinician: Referring Provider: Treating Provider/Extender: [name] in Treatment: 34 Subjective Chief Complaint Information obtained from Patient Left leg and foot ulcers 04/12/2021; patient is here for wounds on his left lower leg and left plantar foot over the first metatarsal head History of Present Illness (HPI) 10/11/17; Mr. Turnbaugh is a 71 year old man who tells me that in 2015 he slipped down the latter traumatizing his left leg. He developed a wound in the same spot the area that we are currently looking at. He states this closed over for the most part although he always felt it was somewhat unstable. In 2016 he hit the same area with the door of his car had this reopened. He tells me that this is never really closed although sometimes an inflow it remains open on a constant basis. He has not been using any specific dressing to this except for topical antibiotics the nature of which were not really sure. His primary doctor did send him to see Dr. [name] of interventional cardiology. He underwent an angiogram on 08/06/17 and he underwent a PTA and directional atherectomy of the lesser distal SFA and popliteal arteries which resulted in brisk improvement in blood flow. It was noted that he had 2 vessel runoff through the anterior tibial and  peroneal. He is also been to see vascular and interventional radiologist. He was not felt to have any significant superficial venous insufficiency. Presumably is not a candidate for any ablation. It was suggested he come here for wound care. The patient is a type II diabetic on insulin. He also has a  history of venous insufficiency. ABIs on the left were noncompressible in our clinic 10/21/17; patient we admitted to the clinic last week. 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 We put Iodosorb on him after an aggressive debridement and 3 layer compression. He complained of pain in his ankle and itching with is skin in fact he scratched the area on the medial calf superiorly at the rim of our wraps and he has 2 small open areas in that location today which are new. I changed his primary dressing today to silver collagen. As noted he is already had revascularization and does not have any significant superficial venous insufficiency that would be amenable to ablation 10/28/17; patient admitted to the clinic 2 weeks ago. He has a smaller Wound. Scratch injury from last week revealed. There is large wound over the tibial area. This is smaller. Granulation looks healthy. No need for debridement. 11/04/17; the wound on the left lateral calf looks better. Improved dimensions. Surface of this looks better. We've been maintaining him and Kerlix Coban wraps. He finds this much more comfortable. Silver collagen dressing 11/11/17; left lateral Wound continues to look healthy be making progress. Using a #5 curet I removed removed nonviable skin from the surface of the wound and then necrotic debris from the wound surface. Surface of the wound continues to look healthy. ooHe also has an open area on the left great toenail bed. We've been using topical antibiotics. 11/19/17; left anterior lateral wound continues to look healthy but it's not  closed. ooHe also had a small wound above this on the left leg ooInitially traumatic wounds in the setting of significant chronic venous insufficiency and stasis dermatitis 11/25/17; left anterior wounds superiorly is closed still a small wound inferiorly. 12/02/17; left anterior tibial area. Arrives today with adherent callus. Post debridement clearly not completely closed. Hydrofera Blue under 3 layer compression. 12/09/17; left anterior tibia. Circumferential eschar however the wound bed looks stable to improved. We've been using Hydrofera Blue under 3 layer compression 12/17/17; left anterior tibia. Apparently this was felt to be closed however when the wrap was taken off there is a skin tear to reopen wounds in the same area we've been using Hydrofera Blue under 3 layer compression 12/23/17 left anterior tibia. Not close to close this week apparently the [facility] was stuck to this again. Still circumferential eschar requiring debridement. I put a contact layer on this this time under the Hydrofera Blue 12/31/17; left anterior tibia. Wound is better slight amount of hyper-granulation. Using Hydrofera Blue over Adaptic. 01/07/18; left anterior tibia. The wound had some surface eschar however after this was removed he has no open wound.he was already revascularized by Dr. [name] when he came to our clinic with atherectomy of the left SFA and popliteal artery. He was also sent to interventional radiology for venous reflux studies. He was not felt to have significant reflux but certainly has chronic venous changes of his skin with hemosiderin deposition around this area. He will definitely need to lubricate his skin and wear compression stocking and I've talked to him about this. READMISSION 05/26/2018 This is a now 71 year old man we cared for with traumatic wounds on his left anterior lower extremity. He had been previously revascularized during that admission by Dr. [name]. Apparently in follow-up  Dr. [name] noted that he had deterioration in his arterial status. He underwent a stent placement in the distal left SFA on 04/22/2018.  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 He went back to the angiography suite on 04/30/2018 he underwent PTA and balloon angioplasty of the occluded left mid anterior tibial artery, thrombotic occlusion went from 100 to 0% which reconstitutes the posterior tibial artery. He had thrombectomy and aspiration of the peroneal artery. The stent placed in the distal SFA left SFA was still occluded. 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 He has been recuperating at [place] [facility] in [facility] after the most recent hospitalization. He is going home tomorrow. He tells me that 2 weeks ago he traumatized the tip of his left fifth toe. He came in urgently for our review of this. This was a history of before I noted that Dr. [name] had already noted dry gangrenous changes of the left fifth toe 06/09/2018; 2-week follow-up. I did contact Dr. [name] after his last appointment and he apparently saw 1 of Dr. Irven Shelling colleagues the next day. He does not follow-up with Dr. [name] himself until Thursday of this week. 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 Nevertheless there is no evidence of infection no drainage and no pain. He had a new area that this week when we were signing him in today on the left anterior mid tibia area, this is in close proximity to the previous wound we have dealt with in this clinic. 06/23/2018; 2-week follow-up. I did not receive a recent note from Dr. [name] to review today. Our office is trying to obtain this. He is apparently not planning to do further vascular  interventions and wondered about compression to try and help with the patient's chronic venous insufficiency. However we are also concerned about the arterial flow. ooHe arrives in clinic today with a new area on the left third toe. The areas on the calf/anterior tibia are close to closing. The left fifth toe is still mummified using Betadine. -In reviewing things with the patient he has what sounds like claudication with mild to moderate amount of activity. 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 I prescribed Levaquin over the phone while we attempted to arrange a plan of care. However the patient called yesterday to report he had low-grade fever and he came in today acutely. There is been a marked deterioration in the left third toe with spreading cellulitis up into the dorsal left foot. He was referred to the emergency room. Readmission: 06/29/2020 patient presents today for reevaluation here in our clinic he was previously treated by Dr. Dellia Nims at the latter part of 2019 in 2 the beginning of 2020. Subsequently we have not seen him since that time in the interim he did have evaluation with vein and vascular specialist specifically Dr. Anice Paganini who did perform quite extensive work for a left femoral to anterior tibial artery bypass. With that being said in the interim the patient has developed significant lymphedema and has wounds that he tells me have really never healed in regard to the incision site on the left leg. He also has multiple wounds on the feet for various reasons some of which is that he tends to pick at his feet. Fortunately there is no signs of active infection  systemically at this time he does have some wounds that are little bit deeper but most are fairly superficial he seems to have good blood flow and overall everything appears to be healthy I see no bone exposed and no obvious signs of osteomyelitis. I do not know that he necessarily needs  a x-ray at this point although that something we could consider depending on how things progress. The patient does have a history of lymphedema, diabetes, this is type II, chronic kidney disease stage III, hypertension, and history of peripheral vascular disease. 07/05/2020; patient admitted last week. Is a patient I remember from 2019 he had a spreading infection involving the left foot and we sent him to the hospital. 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 He subsequently had a left femoral to anterior tibial bypass by Dr.Cain vein and vascular. He also has severe lymphedema with chronic skin changes related to that on the left leg. The most problematic area that was new today was on the left medial great toe. This was apparently a small area last week there was purulent drainage which our intake nurse cultured. Also areas on the left medial foot and heel left lateral foot. He has 2 areas on the left medial calf left lateral calf in the setting of the severe lymphedema. 07/13/2020 on evaluation today patient appears to be doing better in my opinion compared to his last visit. The good news is there is no signs of active infection systemically and locally I do not see any signs of infection either. He did have an x-ray which was negative that is great news he had a culture which showed MRSA but at the same time he is been on the doxycycline which has helped. I do think we may want to extend this for 7 additional days 1/25; patient admitted to the clinic a few weeks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

## 2021-12-14 NOTE — Progress Notes (Signed)
[name], [name] ([id number]) Visit Report for 12/14/2021 Arrival Information Details Patient Name: Date of Service: [name], [name] 12/14/2021 7:45 A M Medical Record Number: [id number] Patient Account Number: [id number] Date of Birth/Sex: Treating RN: November 28, 1950 (71 y.o. [name] Primary Care Lakeyshia Tuckerman: [name] Other Clinician: Referring Winn Muehl: Treating Goran Olden/Extender: [name] in Treatment: 54 Visit Information History Since Last Visit Added or deleted any medications: No Patient Arrived: Ambulatory Any new allergies or adverse reactions: No Arrival Time: 07:44 Had a fall or experienced change in No Accompanied By: self activities of daily living that may affect Transfer Assistance: None risk of falls: Patient Identification Verified: Yes Signs or symptoms of abuse/neglect since last visito No Secondary Verification Process Completed: Yes Hospitalized since last visit: No Patient Requires Transmission-Based Precautions: No Implantable device outside of the clinic excluding No Patient Has Alerts: Yes cellular tissue based products placed in the center since last visit: Has Dressing in Place as Prescribed: Yes Has Compression in Place as Prescribed: Yes Pain Present Now: No Electronic Signature(s) Signed: 12/14/2021 5:07:36 PM By: Adline Peals Entered By: Adline Peals on 12/14/2021 07:49:39 -------------------------------------------------------------------------------- Encounter Discharge Information Details Patient Name: Date of Service: [name] Garfinkel. 12/14/2021 7:45 A M Medical Record Number: [id number] Patient Account Number: [id number] Date of Birth/Sex: Treating RN: Apr 04, 1951 (71 y.o. [name] Primary Care Kassadi Presswood: [name] Other Clinician: Referring Donnivan Villena: Treating Khori Rosevear/Extender: [name] in Treatment: 1 Encounter Discharge Information  Items Discharge Condition: Stable Ambulatory Status: Ambulatory Discharge Destination: Home Transportation: Private Auto Accompanied By: self Schedule Follow-up Appointment: Yes Clinical Summary of Care: Patient Declined Electronic Signature(s) Signed: 12/14/2021 5:07:36 PM By: Sabas Sous By: Adline Peals on 12/14/2021 08:45:06 -------------------------------------------------------------------------------- Lower Extremity Assessment Details Patient Name: Date of Service: [name], [name] 12/14/2021 7:45 A M Medical Record Number: [id number] Patient Account Number: [id number] Date of Birth/Sex: Treating RN: 1951-05-16 (71 y.o. [name] Primary Care Zyonna Vardaman: [name] Other Clinician: Referring Meztli Llanas: Treating Lory Nowaczyk/Extender: [name] in Treatment: 35 Edema Assessment Assessed: [name]: No] [name]: No] Edema: [Left: Ye] [Right: s] Calf Left: Right: Point of Measurement: 41 cm From Medial Instep 44.3 cm Ankle Left: Right: Point of Measurement: 10 cm From Medial Instep 28.5 cm Vascular Assessment Pulses: Dorsalis Pedis Palpable: [Left:Yes] Electronic Signature(s) Signed: 12/14/2021 5:07:36 PM By: Adline Peals Entered By: Adline Peals on 12/14/2021 07:58:47 -------------------------------------------------------------------------------- Multi Wound Chart Details Patient Name: Date of Service: [name] Garfinkel. 12/14/2021 7:45 A M Medical Record Number: [id number] Patient Account Number: [id number] Date of Birth/Sex: Treating RN: 1950-09-22 (71 y.o. 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 Primary Etiology: Extremity Glaucoma, Sleep Apnea, Glaucoma, Sleep Apnea, Glaucoma, Sleep Apnea, Comorbid History: Hypertension, Peripheral Arterial Hypertension, Peripheral Arterial Hypertension, Peripheral Arterial Disease, Peripheral Venous Disease, Disease, Peripheral Venous Disease, Disease, Peripheral Venous Disease, Type II Diabetes, Gout, Osteoarthritis, Type II Diabetes, Gout, Osteoarthritis, Type II Diabetes, Gout, Osteoarthritis, Neuropathy Neuropathy Neuropathy 08/23/2020 06/03/2021 09/14/2021 Date Acquired: 35 27 12 Weeks of Treatment: Open Open Open Wound Status: No No Yes Wound Recurrence: No No Yes Clustered Wound:  N/A N/A 3 Clustered Quantity: 0x0x0 8.6x2.7x0.2 0x0x0 Measurements L x W x D (cm) 0 18.237 0 A (cm) : rea 0 3.647 0 Volume (cm) : 100.00% -1005.90% 100.00% % Reduction in A rea: 100.00% -176.50% 100.00% % Reduction in Volume: 3 Position 1 (o'clock): 3 Maximum Distance 1 (cm): No Yes No Tunneling: Grade 2 Full Thickness With Exposed Support Full Thickness Without Exposed Classification: Structures Support Structures None Present Large None Present Exudate Amount: N/A Serosanguineous N/A Exudate Type: N/A red, brown N/A Exudate Color: Thickened Distinct, outline attached Distinct, outline attached Wound Margin: None Present (0%) Large (67-100%) None Present (0%) Granulation Amount: N/A Red, Pink N/A Granulation Quality: None Present (0%) None Present (0%) None Present (0%) Necrotic Amount: Fascia: No Fat Layer (Subcutaneous Tissue): Yes Fascia: No Exposed Structures: Fat Layer (Subcutaneous Tissue): No Fascia: No Fat Layer (Subcutaneous Tissue): No Tendon: No Tendon: No Tendon: No Muscle: No Muscle:  No Muscle: No Joint: No Joint: No Joint: No Bone: No Bone: No Bone: No Large (67-100%) Small (1-33%) Large (67-100%) Epithelialization: N/A Negative Pressure Wound Therapy N/A Procedures Performed: Maintenance (NPWT) Treatment Notes Electronic Signature(s) Signed: 12/14/2021 8:17:49 AM By: Fredirick Maudlin MD FACS Signed: 12/14/2021 5:07:36 PM By: Adline Peals Entered By: Fredirick Maudlin on 12/14/2021 08:17:48 -------------------------------------------------------------------------------- Multi-Disciplinary Care Plan Details Patient Name: Date of Service: [name] Garfinkel. 12/14/2021 7:45 A M Medical Record Number: [id number] Patient Account Number: [id number] Date of Birth/Sex: Treating RN: February 27, 1951 (71 y.o. [name] Primary Care Kavontae Pritchard: [name] Other Clinician: Referring Marishka Rentfrow: Treating Hailie Searight/Extender: [name] in Treatment: 63 Multidisciplinary Care Plan reviewed with physician Active Inactive Venous Leg Ulcer Nursing Diagnoses: Knowledge deficit related to disease process and management Potential for venous Insuffiency (use before diagnosis confirmed) Goals: Patient will maintain optimal edema control Date Initiated: 07/27/2021 Target Resolution Date: 01/17/2022 Goal Status: Active Interventions: Assess peripheral edema status every visit. Treatment Activities: Therapeutic compression applied : 07/27/2021 Notes: Electronic Signature(s) Signed: 12/14/2021 5:07:36 PM By: Adline Peals Entered By: Adline Peals on 12/14/2021 08:05:10 -------------------------------------------------------------------------------- Negative Pressure Wound Therapy Maintenance (NPWT) Details Patient Name: Date of Service: [name], [name] 12/14/2021 7:45 A M Medical Record Number: [id number] Patient Account Number: [id number] Date of Birth/Sex: Treating RN: 1950/12/29 (71 y.o. [name] Primary Care  Lula Michaux: [name] Other Clinician: Referring Jonella Redditt: Treating Wenceslao Loper/Extender: [name] in Treatment: 35 NPWT Maintenance Performed for: Wound #22 Left, Lateral Lower Leg Additional Injuries Covered: No Performed By: Adline Peals, RN Type: VAC System Coverage Size (sq cm): 23.22 Pressure Type: Constant Pressure Setting: 125 mmHG Drain Type: None Sponge/Dressing Type: Combination : black and white Date Initiated: 07/27/2021 Dressing Removed: Yes Quantity of Sponges/Gauze Removed: 2 Canister Changed: No Dressing Reapplied: Yes Quantity of Sponges/Gauze Inserted: 2 Days On NPWT : 141 Post Procedure Diagnosis Same as Pre-procedure Electronic Signature(s) Signed: 12/14/2021 5:07:36 PM By: Adline Peals Entered By: Adline Peals on 12/14/2021 08:15:30 -------------------------------------------------------------------------------- Pain Assessment Details Patient Name: Date of Service: [name], [name] 12/14/2021 7:45 A M Medical Record Number: [id number] Patient Account Number: [id number] Date of Birth/Sex: Treating RN: 10-Mar-1951 (71 y.o. [name] Primary Care Elliot Simoneaux: [name] Other Clinician: Referring Kortez Murtagh: Treating Kimberlee Shoun/Extender: [name] in Treatment: 35 Active Problems Location of Pain Severity and Description of Pain Patient Has Paino No Site Locations Site Locations Rate the pain. Current Pain Level: 0 Pain Management and Medication Current Pain Management: Electronic Signature(s) Signed: 12/14/2021 5:07:36 PM By: Sabas Sous  By: Adline Peals on 12/14/2021 07:50:47 -------------------------------------------------------------------------------- Patient/Caregiver Education Details Patient Name: Date of Service: [name], [name] 6/22/2023andnbsp7:45 A M Medical Record Number: [id number] Patient Account Number: [id number] Date of  Birth/Gender: Treating RN: [birth year] (71 y.o. [name] Primary Care Physician: [name] Other Clinician: Referring Physician: Treating Physician/Extender: [name] in Treatment: 74 Education Assessment Education Provided To: Patient Education Topics Provided Wound/Skin Impairment: Methods: Explain/Verbal Responses: Reinforcements needed, State content correctly Electronic Signature(s) Signed: 12/14/2021 5:07:36 PM By: Adline Peals Entered By: Adline Peals on 12/14/2021 08:05:23 -------------------------------------------------------------------------------- Wound Assessment Details Patient Name: Date of Service: [name] Garfinkel. 12/14/2021 7:45 A M Medical Record Number: [id number] Patient Account Number: [id number] Date of Birth/Sex: Treating RN: 06-Dec-1950 (71 y.o. [name] Primary Care Deane Melick: [name] Other Clinician: Referring Yisroel Mullendore: Treating Shanae Luo/Extender: [name] in Treatment: 35 Wound Status Wound Number: 18 Primary Diabetic Wound/Ulcer of the Lower Extremity Etiology: Wound Location: Left, Plantar Metatarsal head first Wound Open Wounding Event: Gradually Appeared Status: Date Acquired: 08/23/2020 Comorbid Glaucoma, Sleep Apnea, Hypertension, Peripheral Arterial Disease, Weeks Of Treatment: 35 History: Peripheral Venous Disease, Type II Diabetes, Gout, Osteoarthritis, Clustered Wound: No Neuropathy Photos Wound Measurements Length: (cm) Width: (cm) Depth: (cm) Area: (cm) Volume: (cm) 0 % Reduction in Area: 100% 0 % Reduction in Volume: 100% 0 Epithelialization: Large (67-100%) 0 Tunneling: No 0 Undermining: No Wound Description Classification: Grade 2 Wound Margin: Thickened Exudate Amount: None Present Foul Odor After Cleansing: No Slough/Fibrino No Wound Bed Granulation Amount: None Present (0%) Exposed Structure Necrotic Amount: None  Present (0%) Fascia Exposed: No Fat Layer (Subcutaneous Tissue) Exposed: No Tendon Exposed: No Muscle Exposed: No Joint Exposed: No Bone Exposed: No Electronic Signature(s) Signed: 12/14/2021 5:07:36 PM By: Adline Peals Entered By: Adline Peals on 12/14/2021 08:04:07 -------------------------------------------------------------------------------- Wound Assessment Details Patient Name: Date of Service: [name] Garfinkel. 12/14/2021 7:45 A M Medical Record Number: [id number] Patient Account Number: [id number] Date of Birth/Sex: Treating RN: 03-17-1951 (71 y.o. [name] Primary Care Dayanis Bergquist: [name] Other Clinician: Referring Tayton Decaire: Treating Ahnaf Caponi/Extender: [name] in Treatment: 35 Wound Status Wound Number: 22 Primary Cyst Etiology: Wound Location: Left, Lateral Lower Leg Wound Open Wounding Event: Bump Status: Date Acquired: 06/03/2021 Comorbid Glaucoma, Sleep Apnea, Hypertension, Peripheral Arterial Disease, Weeks Of Treatment: 27 Weeks Of Treatment: 27 History: Peripheral Venous Disease, Type II Diabetes, Gout, Osteoarthritis, Clustered Wound: No Neuropathy Photos Wound Measurements Length: (cm) 8.6 Width: (cm) 2.7 Depth: (cm) 0.2 Area: (cm) 18.237 Volume: (cm) 3.647 % Reduction in Area: -1005.9% % Reduction in Volume: -176.5% Epithelialization: Small (1-33%) Tunneling: Yes Position (o'clock): 3 Maximum Distance: (cm) 3 Undermining: No Wound Description Classification: Full Thickness With Exposed Support Structures Wound Margin: Distinct, outline attached Exudate Amount: Large Exudate Type: Serosanguineous Exudate Color: red, brown Foul Odor After Cleansing: No Slough/Fibrino No Wound Bed Granulation Amount: Large (67-100%) Exposed Structure Granulation Quality: Red, Pink Fascia Exposed: No Necrotic Amount: None Present (0%) Fat Layer (Subcutaneous Tissue) Exposed: Yes Tendon Exposed:  No Muscle Exposed: No Joint Exposed: No Bone Exposed: No Treatment Notes Wound #22 (Lower Leg) Wound Laterality: Left, Lateral Cleanser Soap and Water Discharge Instruction: May shower and wash wound with dial antibacterial soap and water prior to dressing change. Wound Cleanser Discharge Instruction: Cleanse the wound with wound cleanser prior to applying a clean dressing using gauze sponges, not tissue or cotton balls. Peri-Wound Care Triamcinolone 15 (g) Discharge Instruction: Use triamcinolone 15 (g) as directed Sween  Lotion (Moisturizing lotion) Discharge Instruction: Apply moisturizing lotion as directed Topical Primary Dressing VAC Secondary Dressing Secured With Compression Wrap CoFlex TLC XL 2-layer Compression System 4x7 (in/yd) Discharge Instruction: Apply CoFlex 2-layer compression as directed. (alt for 4 layer) Compression Stockings Add-Ons Electronic Signature(s) Signed: 12/14/2021 5:07:36 PM By: Adline Peals Entered By: Adline Peals on 12/14/2021 08:04:33 -------------------------------------------------------------------------------- Wound Assessment Details Patient Name: Date of Service: [name] Garfinkel. 12/14/2021 7:45 A M Medical Record Number: [id number] Patient Account Number: [id number] Date of Birth/Sex: Treating RN: [birth year] (71 y.o. [name] Primary Care Maybelle Depaoli: [name] Other Clinician: Referring Lavren Lewan: Treating Katalyna Socarras/Extender: [name] in Treatment: 35 Wound Status Wound Number: [id number] Primary Abrasion Etiology: Wound Location: Left, Medial Upper Leg Wound Open Wounding Event: Trauma Status: Date Acquired: 09/14/2021 Comorbid Glaucoma, Sleep Apnea, Hypertension, Peripheral Arterial Disease, Weeks Of Treatment: 12 History: Peripheral Venous Disease, Type II Diabetes, Gout, Osteoarthritis, Clustered Wound: Yes Neuropathy Photos Wound Measurements Length: (cm) Width:  (cm) Depth: (cm) Clustered Quantity: Area: (cm) Volume: (cm) 0 % Reduction in Area: 100% 0 % Reduction in Volume: 100% 0 Epithelialization: Large (67-100%) 3 Tunneling: No 0 Undermining: No 0 Wound Description Classification: Full Thickness Without Exposed Support Structures Wound Margin: Distinct, outline attached Exudate Amount: None Present Foul Odor After Cleansing: No Slough/Fibrino No Wound Bed Granulation Amount: None Present (0%) Exposed Structure Necrotic Amount: None Present (0%) Fascia Exposed: No Fat Layer (Subcutaneous Tissue) Exposed: No Tendon Exposed: No Muscle Exposed: No Joint Exposed: No Bone Exposed: No Electronic Signature(s) Signed: 12/14/2021 5:07:36 PM By: Adline Peals Entered By: Adline Peals on 12/14/2021 08:03:46 -------------------------------------------------------------------------------- [place] Details Patient Name: Date of Service: [name] Garfinkel. 12/14/2021 7:45 A M Medical Record Number: [id number] Patient Account Number: [id number] Date of Birth/Sex: Treating RN: 1951-01-17 (71 y.o. [name] Primary Care Jay Haskew: [name] Other Clinician: Referring Draya Felker: Treating Hilton Saephan/Extender: [name] in Treatment: 35 Vital Signs Time Taken: 07:50 Temperature (F): 98.3 Height (in): 74 Pulse (bpm): 70 Weight (lbs): 238 Respiratory Rate (breaths/min): 18 Body Mass Index (BMI): 30.6 Blood Pressure (mmHg): 137/72 Reference Range: 80 - 120 mg / dl Electronic Signature(s) Signed: 12/14/2021 5:07:36 PM By: Adline Peals Entered By: Adline Peals on 12/14/2021 07:50:40

## 2021-12-18 ENCOUNTER — Encounter (HOSPITAL_BASED_OUTPATIENT_CLINIC_OR_DEPARTMENT_OTHER): Payer: Medicare Other | Admitting: General Surgery

## 2021-12-18 DIAGNOSIS — E11621 Type 2 diabetes mellitus with foot ulcer: Secondary | ICD-10-CM | POA: Diagnosis not present

## 2021-12-20 ENCOUNTER — Ambulatory Visit (INDEPENDENT_AMBULATORY_CARE_PROVIDER_SITE_OTHER): Payer: BC Managed Care – PPO | Admitting: Vascular Surgery

## 2021-12-20 ENCOUNTER — Encounter: Payer: Self-pay | Admitting: Vascular Surgery

## 2021-12-20 VITALS — BP 115/70 | HR 71 | Temp 98.6°F | Resp 20 | Ht 73.5 in | Wt 268.1 lb

## 2021-12-20 DIAGNOSIS — I739 Peripheral vascular disease, unspecified: Secondary | ICD-10-CM

## 2021-12-20 DIAGNOSIS — S81802D Unspecified open wound, left lower leg, subsequent encounter: Secondary | ICD-10-CM

## 2021-12-20 NOTE — Progress Notes (Signed)
     Subjective:     Patient ID: [name], male   DOB: 02-Sep-1950, 71 y.o.   MRN: [id number]  HPI 71 year old male status post I&D of left lower extremity wound with a femoral to anterior tibial artery bypass.  Wound is now healing well under the direction of Dr. Celine Ahr at the wound center.  He denies any fevers or chills.   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  We discussed the only way to remove the noncovered stent would be for medial approach but if the wound heals there is no indication for that.  He will follow-up in 3 months with left lower extremity bypass graft duplex at which time we will have been 1 year since the last duplex and hopefully his wound is healed.  If there are wound issues between now and then he can certainly see me sooner and we would consider CT scan to evaluate his residual stent location.  Alona Danford C. Donzetta Matters, MD [facility] Office: [phone] Pager: [phone]

## 2021-12-22 ENCOUNTER — Encounter (HOSPITAL_BASED_OUTPATIENT_CLINIC_OR_DEPARTMENT_OTHER): Payer: Medicare Other | Admitting: General Surgery

## 2021-12-22 DIAGNOSIS — E11621 Type 2 diabetes mellitus with foot ulcer: Secondary | ICD-10-CM | POA: Diagnosis not present

## 2021-12-22 NOTE — Progress Notes (Signed)
[name], [name] ([id number]) Visit Report for 12/22/2021 Arrival Information Details Patient Name: Date of Service: [name], [name] 12/22/2021 8:30 A M Medical Record Number: [id number] Patient Account Number: [id number] Date of Birth/Sex: Treating RN: 11/21/1950 (71 y.o. [name], [name] Primary Care Ilianna Bown: [name] Other Clinician: Referring [name]: Treating [name] in Treatment: [name] Visit Information History Since Last Visit Added or deleted any medications: No Patient Arrived: Ambulatory Any new allergies or adverse reactions: No Arrival Time: 08:30 Had a fall or experienced change in No Accompanied By: self activities of daily living that may affect Transfer Assistance: None risk of falls: Patient Identification Verified: Yes Signs or symptoms of abuse/neglect since last visito No Secondary Verification Process Completed: Yes Hospitalized since last visit: No Patient Requires Transmission-Based Precautions: No Implantable device outside of the clinic excluding No Patient Has Alerts: Yes cellular tissue based products placed in the center since last visit: Has Dressing in Place as Prescribed: Yes Pain Present Now: No Electronic Signature(s) Signed: 12/22/2021 11:23:28 AM By: [name] Entered By: [name] on 12/22/2021 08:31:00 -------------------------------------------------------------------------------- Compression Therapy Details Patient Name: Date of Service: [name]. 12/22/2021 8:30 A M Medical Record Number: [id number] Patient Account Number: [id number] Date of Birth/Sex: Treating RN: Nov 01, 1950 (71 y.o. [name] Primary Care Dacy Enrico: [name] Other Clinician: Referring Shakeel [name]: Treating Amilya Haver/Extender: [name] Compression Therapy Performed for Wound Assessment: Wound #22 Left,Lateral Lower Leg Performed By:  Clinician [name] Peals, RN Compression Type: Double Layer Post Procedure Diagnosis Same as Pre-procedure Electronic Signature(s) Signed: 12/22/2021 5:03:19 PM By: [name] By: [name] on 12/22/2021 16:41:46 -------------------------------------------------------------------------------- Encounter Discharge Information Details Patient Name: Date of Service: [name]. 12/22/2021 8:30 A M Medical Record Number: [id number] Patient Account Number: [id number] Date of Birth/Sex: Treating RN: June 13, 1951 ([name] y.o. [name] Primary Care Lithzy [name]: [name] Other Clinician: Referring [name]: Treating [name]/Extender: [name] Encounter Discharge Information Items Post Procedure Vitals Discharge Condition: Stable Temperature (F): 98.1 Ambulatory Status: Ambulatory Pulse (bpm): 68 Discharge Destination: Home Respiratory Rate (breaths/min): 18 Transportation: Private Auto Blood Pressure (mmHg): 148/68 Accompanied By: self Schedule Follow-up Appointment: Yes Clinical Summary of Care: Patient Declined Electronic Signature(s) Signed: 12/22/2021 5:03:19 PM By: [name] Entered By: [name] on 12/22/2021 09:23:12 -------------------------------------------------------------------------------- Lower Extremity Assessment Details Patient Name: Date of Service: [name]. 12/22/2021 8:30 A M Medical Record Number: [id number] Patient Account Number: [id number] Date of Birth/Sex: Treating RN: January 20, 1951 (71 y.o. [name] Primary Care [place]: [name] Other Clinician: Referring Makeisha Jentsch: Treating [name]/Extender: [name] Edema Assessment Assessed: [name]: No] [name]: No] Edema: [Left: Ye] [Right: s] Calf Left: Right: Point of Measurement: 41 cm From Medial Instep 44 cm Ankle Left: Right: Point of  Measurement: 10 cm From Medial Instep 28.7 cm Vascular Assessment Pulses: Dorsalis Pedis Palpable: [Left:Yes] Electronic Signature(s) Signed: 12/22/2021 5:39:10 PM By: [name] Catholic RN Entered By: [name] on 12/22/2021 08:37:53 -------------------------------------------------------------------------------- Multi Wound Chart Details Patient Name: Date of Service: [name]. 12/22/2021 8:30 A M Medical Record Number: [id number] Patient Account Number: [id number] Date of Birth/Sex: Treating RN: 1950/12/09 (70 y.o. 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 Primary Etiology: Extremity Glaucoma, Sleep Apnea, Glaucoma, Sleep Apnea, Glaucoma, Sleep Apnea, Comorbid History: Hypertension, Peripheral Arterial Hypertension, Peripheral Arterial Hypertension, Peripheral Arterial Disease, Peripheral Venous Disease, Disease, Peripheral Venous Disease, Disease, Peripheral Venous Disease, Type II Diabetes, Gout, Osteoarthritis, Type II Diabetes, Gout, Osteoarthritis, Type II Diabetes, Gout, Osteoarthritis, Neuropathy Neuropathy Neuropathy 08/23/2020 06/03/2021 09/14/2021 Date Acquired: [name] 28 13 Weeks of Treatment: Open Open Open Wound Status: No No Yes Wound Recurrence: No No Yes Clustered Wound: N/A N/A 3 Clustered Quantity: 0.2x0.2x0.1 9.5x2.2x0.1 0.3x0.3x0.1 Measurements L x W x D (cm) 0.031  16.415 0.071 A (cm) : rea 0.003 1.641 0.007 Volume (cm) : 99.80% -895.50% 95.70% % Reduction in A rea: 99.90% -24.40% 95.80% % Reduction in Volume: 3 Position 1 (o'clock): 3.1 Maximum Distance 1 (cm): No Yes No Tunneling: Grade 2 Full Thickness With Exposed Support Full Thickness Without Exposed Classification: Structures Support Structures Medium Medium Small Exudate A mount: Serosanguineous Serosanguineous Serosanguineous Exudate Type: red, brown red, brown red, brown Exudate Color: Thickened Distinct, outline attached Distinct, outline attached Wound Margin: Large (67-100%) Large (67-100%) Large (67-100%) Granulation A mount: Red Red, Pink Red Granulation Quality: Small (1-33%) None Present (0%) Small (1-33%) Necrotic A mount: Fat Layer (Subcutaneous Tissue): Yes Fat Layer (Subcutaneous Tissue): Yes Fat Layer (Subcutaneous Tissue): Yes Exposed Structures: Fascia: No Fascia: No Fascia: No Tendon: No Tendon: No Tendon: No Muscle: No Muscle: No Muscle: No Joint: No Joint: No Joint: No Bone: No Bone: No Bone: No Large (67-100%) Small (1-33%) Large (67-100%) Epithelialization: Debridement - Excisional Debridement - Excisional Debridement - Selective/Open Wound Debridement: Pre-procedure Verification/Time Out 08:47 08:47 08:47 Taken: Lidocaine 4% Topical Solution Lidocaine 4% Topical Solution Lidocaine 4% Topical Solution Pain Control: Callus, Subcutaneous, Slough Subcutaneous, [employer] Tissue Debrided: Skin/Subcutaneous Tissue Skin/Subcutaneous Tissue Non-Viable Tissue Level: 0.04 20.9 0.09 Debridement A (sq cm): rea Curette Curette Curette Instrument: Minimum Minimum Minimum Bleeding: Pressure Pressure Pressure Hemostasis A chieved: 0 0 0 Procedural Pain: 0 0 0 Post Procedural Pain: Procedure was tolerated well Procedure was tolerated well Procedure was tolerated well Debridement Treatment Response: 0.2x0.2x0.1 9.5x2.2x0.1  0.3x0.3x0.1 Post Debridement Measurements L x W x D (cm) 0.003 1.641 0.007 Post Debridement Volume: (cm) Debridement Debridement Debridement Procedures Performed: Treatment Notes Electronic Signature(s) Signed: 12/22/2021 8:50:41 AM By: Fredirick Maudlin MD FACS Signed: 12/22/2021 5:03:19 PM By: [name] By: Fredirick Maudlin on 12/22/2021 08:50:40 -------------------------------------------------------------------------------- Multi-Disciplinary Care Plan Details Patient Name: Date of Service: [name]. 12/22/2021 8:30 A M Medical Record Number: [id number] Patient Account Number: [id number] Date of Birth/Sex: Treating RN: 1950/08/31 (71 y.o. [name] Primary Care Desarea [place]: [name] Other Clinician: Referring Shakiyla Kook: Treating Homar Weinkauf/Extender: [name] Multidisciplinary Care Plan reviewed with physician Active Inactive Venous Leg Ulcer Nursing Diagnoses: Knowledge deficit related to disease process and management Potential for venous Insuffiency (use before diagnosis confirmed) Goals: Patient will maintain optimal edema control Date Initiated: 07/27/2021 Target Resolution Date: 01/17/2022 Goal Status: Active Interventions: Assess peripheral edema status every visit. Treatment Activities: Therapeutic compression applied : 07/27/2021 Notes: Electronic Signature(s) Signed: 12/22/2021 5:03:19 PM By: [name] Entered By: [name] on 12/22/2021 08:38:38 -------------------------------------------------------------------------------- Negative Pressure Wound Therapy Maintenance (NPWT) Details Patient Name: Date of Service: DAWAN, FARNEY. 12/22/2021 8:30 A M Medical Record Number:  [id number] Patient Account Number: [id number] Date of Birth/Sex: Treating RN: 08-Nov-1950 (71 y.o. [name] Primary Care Jaclyn Andy: [name] Other Clinician: Referring  Adela Esteban: Treating Gerlene Glassburn/Extender: [name] NPWT Maintenance Performed for: Wound #22 Left, Lateral Lower Leg Performed By: [name] Peals, RN Type: VAC System Coverage Size (sq cm): 20.9 Pressure Type: Constant Pressure Setting: 125 mmHG Drain Type: None Sponge/Dressing Type: Combination : black and white Date Initiated: 07/27/2021 Dressing Removed: No Canister Changed: No Dressing Reapplied: Yes Quantity of Sponges/Gauze Inserted: 2 Days On NPWT : 149 Post Procedure Diagnosis Same as Pre-procedure Electronic Signature(s) Signed: 12/22/2021 5:03:19 PM By: [name] Entered By: [name] on 12/22/2021 16:42:23 -------------------------------------------------------------------------------- Pain Assessment Details Patient Name: Date of Service: [name]. 12/22/2021 8:30 A M Medical Record Number: [id number] Patient Account Number: [id number] Date of Birth/Sex: Treating RN: 25-Apr-1951 (71 y.o. [name] Primary Care Moksha Dorgan: [name] Other Clinician: Referring Havilah Topor: Treating Lynsi Dooner/Extender: [name] Active Problems Location of Pain Severity and Description of Pain Patient Has Paino No Site Locations Pain Management and Medication Current Pain Management: Electronic Signature(s) Signed: 12/22/2021 11:23:28 AM By: [name] Signed: 12/22/2021 5:03:19 PM By: [name] Entered By: [name] on 12/22/2021 08:33:15 -------------------------------------------------------------------------------- Patient/Caregiver Education Details Patient Name: Date of Service: [name] 6/30/2023andnbsp8:30 [place] Record Number: [id number] Patient Account Number: [id number] Date of Birth/Gender: Treating RN: 1950-09-17 (71 y.o. [name] Primary Care Physician: [name] Other Clinician: Referring  Physician: Treating Physician/Extender: [name] in Treatment: 65 Education Assessment Education Provided To: Patient Education Topics Provided Wound/Skin Impairment: Methods: Explain/Verbal Responses: Reinforcements needed, State content correctly Electronic Signature(s) Signed: 12/22/2021 5:03:19 PM By: [name] By: [name] on 12/22/2021 08:38:49 -------------------------------------------------------------------------------- Wound Assessment Details Patient Name: Date of Service: [name]. 12/22/2021 8:30 A M Medical Record Number: [id number] Patient Account Number: [id number] Date of Birth/Sex: Treating RN: 12-Apr-1951 (71 y.o. [name] Primary Care Osiah Haring: [name] Other Clinician: Referring Kealan Buchan: Treating Kyser Wandel/Extender: [name] Wound Status Wound Number: 18 Primary Diabetic Wound/Ulcer of the Lower Extremity Etiology: Wound Location: Left, Plantar Metatarsal head first Wound Open Wounding Event: Gradually Appeared Status: Date Acquired: 08/23/2020 Comorbid Glaucoma, Sleep Apnea, Hypertension, Peripheral Arterial Disease, Weeks Of Treatment: [name] History: Peripheral Venous Disease, Type II Diabetes, Gout, Osteoarthritis, Clustered Wound: No Neuropathy Wound Measurements Length: (cm) 0.2 Width: (cm) 0.2 Depth: (cm) 0.1 Area: (cm) 0.031 Volume: (cm) 0.003 % Reduction in Area: 99.8% % Reduction in Volume: 99.9% Epithelialization: Large (67-100%) Tunneling: No Undermining: No Wound Description Classification: Grade 2 Wound Margin: Thickened Exudate Amount: Medium Exudate Type: Serosanguineous Exudate Color: red, brown Foul Odor After Cleansing: No Slough/Fibrino Yes Wound Bed Granulation Amount: Large (67-100%) Exposed Structure Granulation Quality: Red Fascia Exposed: No Necrotic Amount: Small (1-33%) Fat Layer (Subcutaneous  Tissue) Exposed: Yes Necrotic Quality: Adherent Slough Tendon Exposed: No Muscle Exposed: No Joint Exposed: No Bone Exposed: No Treatment Notes Wound #18 (Metatarsal head first) Wound Laterality: Plantar, Left Cleanser Soap and Water Discharge Instruction: May shower and wash wound with dial antibacterial soap and water prior to dressing change. Wound Cleanser Discharge Instruction: Cleanse the wound with wound cleanser prior to applying a clean dressing using gauze sponges, not tissue or cotton balls. Peri-Wound Care Triamcinolone 15 (g) Discharge Instruction: Use triamcinolone 15 (g) as directed Sween Lotion (Moisturizing lotion) Discharge Instruction: Apply moisturizing lotion as  directed Topical Primary Dressing Promogran Prisma Matrix, 4.34 (sq in) (silver collagen) Discharge Instruction: Moisten collagen with saline or hydrogel Secondary Dressing Woven Gauze Sponge, Non-Sterile 4x4 in Discharge Instruction: Apply over primary dressing as directed. Secured With 79M Medipore H Soft Cloth Surgical T ape, 4 x 10 (in/yd) Discharge Instruction: Secure with tape as directed. Compression Wrap Compression Stockings Add-Ons Electronic Signature(s) Signed: 12/22/2021 5:03:19 PM By: [name] Entered By: [name] on 12/22/2021 08:47:07 -------------------------------------------------------------------------------- Wound Assessment Details Patient Name: Date of Service: [name]. 12/22/2021 8:30 A M Medical Record Number: [id number] Patient Account Number: [id number] Date of Birth/Sex: Treating RN: 03/17/1951 (71 y.o. [name] Primary Care Mystery Schrupp: [name] Other Clinician: Referring Laureano Hetzer: Treating Arneta Mahmood/Extender: [name] Wound Status Wound Number: 22 Primary Cyst Etiology: Wound Location: Left, Lateral Lower Leg Wound Open Wounding Event: Bump Status: Date Acquired:  06/03/2021 Comorbid Glaucoma, Sleep Apnea, Hypertension, Peripheral Arterial Disease, Weeks Of Treatment: 28 History: Peripheral Venous Disease, Type II Diabetes, Gout, Osteoarthritis, Clustered Wound: No Neuropathy Photos Wound Measurements Length: (cm) 9.5 Width: (cm) 2.2 Depth: (cm) 0.1 Area: (cm) 16.415 Volume: (cm) 1.641 % Reduction in Area: -895.5% % Reduction in Volume: -24.4% Epithelialization: Small (1-33%) Tunneling: Yes Position (o'clock): 3 Maximum Distance: (cm) 3.1 Undermining: No Wound Description Classification: Full Thickness With Exposed Support Structures Wound Margin: Distinct, outline attached Exudate Amount: Medium Exudate Type: Serosanguineous Exudate Color: red, brown Foul Odor After Cleansing: No Slough/Fibrino No Wound Bed Granulation Amount: Large (67-100%) Exposed Structure Granulation Quality: Red, Pink Fascia Exposed: No Necrotic Amount: None Present (0%) Fat Layer (Subcutaneous Tissue) Exposed: Yes Tendon Exposed: No Muscle Exposed: No Joint Exposed: No Bone Exposed: No Treatment Notes Wound #22 (Lower Leg) Wound Laterality: Left, Lateral Cleanser Soap and Water Discharge Instruction: May shower and wash wound with dial antibacterial soap and water prior to dressing change. Wound Cleanser Discharge Instruction: Cleanse the wound with wound cleanser prior to applying a clean dressing using gauze sponges, not tissue or cotton balls. Peri-Wound Care Triamcinolone 15 (g) Discharge Instruction: Use triamcinolone 15 (g) as directed Sween Lotion (Moisturizing lotion) Discharge Instruction: Apply moisturizing lotion as directed Topical Primary Dressing VAC Secondary Dressing Secured With Compression Wrap CoFlex TLC XL 2-layer Compression System 4x7 (in/yd) Discharge Instruction: Apply CoFlex 2-layer compression as directed. (alt for 4 layer) Compression Stockings Add-Ons Electronic Signature(s) Signed: 12/22/2021 5:03:19 PM By:  [name] Signed: 12/22/2021 5:39:10 PM By: [name] Catholic RN Signed: 12/22/2021 5:39:10 PM By: [name] Catholic RN Entered By: [name] on 12/22/2021 08:49:51 -------------------------------------------------------------------------------- Wound Assessment Details Patient Name: Date of Service: [name]. 12/22/2021 8:30 A M Medical Record Number: [id number] Patient Account Number: [id number] Date of Birth/Sex: Treating RN: 1951-01-23 (71 y.o. [name] Primary Care Newell Wafer: [name] Other Clinician: Referring Berkeley Veldman: Treating Jamiaya Bina/Extender: [name] Wound Status Wound Number: [id number] Primary Abrasion Etiology: Wound Location: Left, Medial Upper Leg Wound Open Wounding Event: Trauma Status: Date Acquired: 09/14/2021 Comorbid Glaucoma, Sleep Apnea, Hypertension, Peripheral Arterial Disease, Weeks Of Treatment: 13 History: Peripheral Venous Disease, Type II Diabetes, Gout, Osteoarthritis, Clustered Wound: Yes Neuropathy Wound Measurements Length: (cm) 0.3 Width: (cm) 0.3 Depth: (cm) 0.1 Clustered Quantity: 3 Area: (cm) 0.071 Volume: (cm) 0.007 % Reduction in Area: 95.7% % Reduction in Volume: 95.8% Epithelialization: Large (67-100%) Tunneling: No Undermining: No Wound Description Classification: Full Thickness Without Exposed Support Structures Wound Margin: Distinct, outline attached Exudate Amount: Small Exudate Type: Serosanguineous Exudate Color:  red, brown Foul Odor After Cleansing: No Slough/Fibrino Yes Wound Bed Granulation Amount: Large (67-100%) Exposed Structure Granulation Quality: Red Fascia Exposed: No Necrotic Amount: Small (1-33%) Fat Layer (Subcutaneous Tissue) Exposed: Yes Necrotic Quality: Adherent Slough Tendon Exposed: No Muscle Exposed: No Joint Exposed: No Bone Exposed: No Treatment Notes Wound #24R (Upper Leg) Wound Laterality: Left,  Medial Cleanser Soap and Water Discharge Instruction: May shower and wash wound with dial antibacterial soap and water prior to dressing change. Wound Cleanser Discharge Instruction: Cleanse the wound with wound cleanser prior to applying a clean dressing using gauze sponges, not tissue or cotton balls. Peri-Wound Care Triamcinolone 15 (g) Discharge Instruction: Use triamcinolone 15 (g) as directed Sween Lotion (Moisturizing lotion) Discharge Instruction: Apply moisturizing lotion as directed Topical Primary Dressing KerraCel Ag Gelling Fiber Dressing, 2x2 in (silver alginate) Discharge Instruction: Apply silver alginate to wound bed as instructed Secondary Dressing Woven Gauze Sponge, Non-Sterile 4x4 in Discharge Instruction: Apply over primary dressing as directed. Secured With Elastic Bandage 4 inch (ACE bandage) Discharge Instruction: Secure with ACE bandage as directed. Compression Wrap Compression Stockings Add-Ons Electronic Signature(s) Signed: 12/22/2021 5:03:19 PM By: [name] Entered By: [name] on 12/22/2021 08:45:52 -------------------------------------------------------------------------------- Vitals Details Patient Name: Date of Service: [name]. 12/22/2021 8:30 A M Medical Record Number: [id number] Patient Account Number: [id number] Date of Birth/Sex: Treating RN: 10-31-1950 (71 y.o. [name] Primary Care Jaqulyn Chancellor: [name] Other Clinician: Referring Kyarah Enamorado: Treating Janeisha [name]/Extender: [name] Vital Signs Time Taken: 08:31 Temperature (F): 98.1 Height (in): 74 Pulse (bpm): 68 Weight (lbs): 238 Respiratory Rate (breaths/min): 18 Body Mass Index (BMI): 30.6 Blood Pressure (mmHg): 148/78 Capillary Blood Glucose (mg/dl): 254 Reference Range: 80 - 120 mg / dl Electronic Signature(s) Signed: 12/22/2021 11:23:28 AM By: [name] Entered By: [name]  on 12/22/2021 08:33:06

## 2021-12-25 ENCOUNTER — Encounter (HOSPITAL_BASED_OUTPATIENT_CLINIC_OR_DEPARTMENT_OTHER): Payer: Medicare Other | Attending: General Surgery | Admitting: General Surgery

## 2021-12-25 DIAGNOSIS — L84 Corns and callosities: Secondary | ICD-10-CM | POA: Insufficient documentation

## 2021-12-25 DIAGNOSIS — L97828 Non-pressure chronic ulcer of other part of left lower leg with other specified severity: Secondary | ICD-10-CM | POA: Diagnosis not present

## 2021-12-25 DIAGNOSIS — E1151 Type 2 diabetes mellitus with diabetic peripheral angiopathy without gangrene: Secondary | ICD-10-CM | POA: Diagnosis not present

## 2021-12-25 DIAGNOSIS — I87322 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: Secondary | ICD-10-CM | POA: Diagnosis not present

## 2021-12-25 DIAGNOSIS — I890 Lymphedema, not elsewhere classified: Secondary | ICD-10-CM | POA: Insufficient documentation

## 2021-12-25 NOTE — Progress Notes (Signed)
SHANA, YOUNGE ([id number]) Visit Report for 12/22/2021 Chief Complaint Document Details Patient Name: Date of Service: ALVAH, LAGROW 12/22/2021 8:30 A M Medical Record Number: [id number] Patient Account Number: [id number] Date of Birth/Sex: Treating RN: September 17, 1950 (71 y.o. [name] Primary Care Provider: [name] Other Clinician: Referring Provider: Treating Provider/Extender: [name] in Treatment: 36 Information Obtained from: Patient Chief Complaint Left leg and foot ulcers 04/12/2021; patient is here for wounds on his left lower leg and left plantar foot over the first metatarsal head Electronic Signature(s) Signed: 12/22/2021 8:51:04 AM By: Fredirick Maudlin MD FACS Entered By: Fredirick Maudlin on 12/22/2021 08:51:04 -------------------------------------------------------------------------------- Debridement Details Patient Name: Date of Service: [name]. 12/22/2021 8:30 A M Medical Record Number: [id number] Patient Account Number: [id number] Date of Birth/Sex: Treating RN: 1950/12/20 (71 y.o. [name] Primary Care Provider: [name] Other Clinician: Referring Provider: Treating Provider/Extender: [name] in Treatment: 36 Debridement Performed for Assessment: Wound #24R Left,Medial Upper Leg Performed By: Physician Fredirick Maudlin, MD Debridement Type: Debridement Level of Consciousness (Pre-procedure): Awake and Alert Pre-procedure Verification/Time Out Yes - 08:47 Taken: Start Time: 08:47 Pain Control: Lidocaine 4% T opical Solution T Area Debrided (L x W): otal 0.3 (cm) x 0.3 (cm) = 0.09 (cm) Tissue and other material debrided: Non-Viable, Slough, Slough Level: Non-Viable Tissue Debridement Description: Selective/Open Wound Instrument: Curette Bleeding: Minimum Hemostasis Achieved: Pressure Procedural Pain: 0 Post Procedural Pain: 0 Response to Treatment: Procedure  was tolerated well Level of Consciousness (Post- Awake and Alert procedure): Post Debridement Measurements of Total Wound Length: (cm) 0.3 Width: (cm) 0.3 Depth: (cm) 0.1 Volume: (cm) 0.007 Character of Wound/Ulcer Post Debridement: Improved Post Procedure Diagnosis Same as Pre-procedure Electronic Signature(s) Signed: 12/22/2021 11:01:22 AM By: Fredirick Maudlin MD FACS Signed: 12/22/2021 5:03:19 PM By: Adline Peals Entered By: Adline Peals on 12/22/2021 08:48:20 -------------------------------------------------------------------------------- Debridement Details Patient Name: Date of Service: [name]. 12/22/2021 8:30 A M Medical Record Number: [id number] Patient Account Number: [id number] Date of Birth/Sex: Treating RN: [birth year] (71 y.o. [name] Primary Care Provider: [name] Other Clinician: Referring Provider: Treating Provider/Extender: [name] in Treatment: 36 Debridement Performed for Assessment: Wound #18 Left,Plantar Metatarsal head first Performed By: Physician Fredirick Maudlin, MD Debridement Type: Debridement Severity of Tissue Pre Debridement: Fat layer exposed Level of Consciousness (Pre-procedure): Awake and Alert Pre-procedure Verification/Time Out Yes - 08:47 Taken: Start Time: 08:47 Pain Control: Lidocaine 4% T opical Solution T Area Debrided (L x W): otal 0.2 (cm) x 0.2 (cm) = 0.04 (cm) Tissue and other material debrided: Viable, Non-Viable, Callus, Slough, Subcutaneous, Slough Level: Skin/Subcutaneous Tissue Debridement Description: Excisional Instrument: Curette Bleeding: Minimum Hemostasis Achieved: Pressure Procedural Pain: 0 Post Procedural Pain: 0 Response to Treatment: Procedure was tolerated well Level of Consciousness (Post- Awake and Alert procedure): Post Debridement Measurements of Total Wound Length: (cm) 0.2 Width: (cm) 0.2 Depth: (cm) 0.1 Volume: (cm)  0.003 Character of Wound/Ulcer Post Debridement: Improved Severity of Tissue Post Debridement: Fat layer exposed Post Procedure Diagnosis Same as Pre-procedure Electronic Signature(s) Signed: 12/22/2021 11:01:22 AM By: Fredirick Maudlin MD FACS Signed: 12/22/2021 5:03:19 PM By: Adline Peals Entered By: Adline Peals on 12/22/2021 08:48:45 -------------------------------------------------------------------------------- Debridement Details Patient Name: Date of Service: [name]. 12/22/2021 8:30 A M Medical Record Number: [id number] Patient Account Number: [id number] Date of Birth/Sex: Treating RN: March 18, 1951 (71 y.o. [name] Primary Care Provider: Other Clinician: [name] Referring Provider: Treating Provider/Extender: Celine Ahr,  Lindaann Pascal, Carloyn Manner Weeks in Treatment: 36 Debridement Performed for Assessment: Wound #22 Left,Lateral Lower Leg Performed By: Physician Fredirick Maudlin, MD Debridement Type: Debridement Level of Consciousness (Pre-procedure): Awake and Alert Pre-procedure Verification/Time Out Yes - 08:47 Taken: Start Time: 08:47 Pain Control: Lidocaine 4% T opical Solution T Area Debrided (L x W): otal 9.5 (cm) x 2.2 (cm) = 20.9 (cm) Tissue and other material debrided: Viable, Non-Viable, Slough, Subcutaneous, Slough Level: Skin/Subcutaneous Tissue Debridement Description: Excisional Instrument: Curette Bleeding: Minimum Hemostasis Achieved: Pressure Procedural Pain: 0 Post Procedural Pain: 0 Response to Treatment: Procedure was tolerated well Level of Consciousness (Post- Awake and Alert procedure): Post Debridement Measurements of Total Wound Length: (cm) 9.5 Width: (cm) 2.2 Depth: (cm) 0.1 Volume: (cm) 1.641 Character of Wound/Ulcer Post Debridement: Improved Post Procedure Diagnosis Same as Pre-procedure Electronic Signature(s) Signed: 12/22/2021 11:01:22 AM By: Fredirick Maudlin MD FACS Signed: 12/22/2021 5:03:19 PM  By: Adline Peals Entered By: Adline Peals on 12/22/2021 08:49:19 -------------------------------------------------------------------------------- HPI Details Patient Name: Date of Service: [name]. 12/22/2021 8:30 A M Medical Record Number: [id number] Patient Account Number: [id number] Date of Birth/Sex: Treating RN: 03/04/1951 (71 y.o. [name] Primary Care Provider: [name] Other Clinician: Referring Provider: Treating Provider/Extender: [name] in Treatment: 23 History of Present Illness HPI Description: 10/11/17; Mr. Nibert is a 71 year old man who tells me that in 2015 he slipped down the latter traumatizing his left leg. He developed a wound in the same spot the area that we are currently looking at. He states this closed over for the most part although he always felt it was somewhat unstable. In 2016 he hit the same area with the door of his car had this reopened. He tells me that this is never really closed although sometimes an inflow it remains open on a constant basis. He has not been using any specific dressing to this except for topical antibiotics the nature of which were not really sure. His primary doctor did send him to see Dr. [name] of interventional cardiology. He underwent an angiogram on 08/06/17 and he underwent a PTA and directional atherectomy of the lesser distal SFA and popliteal arteries which resulted in brisk improvement in blood flow. It was noted that he had 2 vessel runoff through the anterior tibial and peroneal. He is also been to see vascular and interventional radiologist. He was not felt to have any significant superficial venous insufficiency. Presumably is not a candidate for any ablation. It was suggested he come here for wound care. The patient is a type II diabetic on insulin. He also has a history of venous insufficiency. ABIs on the left were noncompressible in our clinic 10/21/17; patient  we admitted to the clinic last week. 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 We put Iodosorb on him after an aggressive debridement and 3 layer compression. He complained of pain in his ankle and itching with is skin in fact he scratched the area on the medial calf superiorly at the rim of our wraps and he has 2 small open areas in that location today which are new. I changed his primary dressing today to silver collagen. As noted he is already had revascularization and does not have any significant superficial venous insufficiency that would be amenable to ablation 10/28/17; patient admitted to the clinic 2 weeks ago. He has a smaller Wound. Scratch injury from last week revealed. There is large wound  over the tibial area. This is smaller. Granulation looks healthy. No need for debridement. 11/04/17; the wound on the left lateral calf looks better. Improved dimensions. Surface of this looks better. We've been maintaining him and Kerlix Coban wraps. He finds this much more comfortable. Silver collagen dressing 11/11/17; left lateral Wound continues to look healthy be making progress. Using a #5 curet I removed removed nonviable skin from the surface of the wound and then necrotic debris from the wound surface. Surface of the wound continues to look healthy. He also has an open area on the left great toenail bed. We've been using topical antibiotics. 11/19/17; left anterior lateral wound continues to look healthy but it's not closed. He also had a small wound above this on the left leg Initially traumatic wounds in the setting of significant chronic venous insufficiency and stasis dermatitis 11/25/17; left anterior wounds superiorly is closed still a small wound inferiorly. 12/02/17; left anterior tibial area. Arrives today with adherent callus. Post debridement clearly not completely closed. Hydrofera Blue under 3 layer  compression. 12/09/17; left anterior tibia. Circumferential eschar however the wound bed looks stable to improved. We've been using Hydrofera Blue under 3 layer compression 12/17/17; left anterior tibia. Apparently this was felt to be closed however when the wrap was taken off there is a skin tear to reopen wounds in the same area we've been using Hydrofera Blue under 3 layer compression 12/23/17 left anterior tibia. Not close to close this week apparently the [facility] was stuck to this again. Still circumferential eschar requiring debridement. I put a contact layer on this this time under the Hydrofera Blue 12/31/17; left anterior tibia. Wound is better slight amount of hyper-granulation. Using Hydrofera Blue over Adaptic. 01/07/18; left anterior tibia. The wound had some surface eschar however after this was removed he has no open wound.he was already revascularized by Dr. [name] when he came to our clinic with atherectomy of the left SFA and popliteal artery. He was also sent to interventional radiology for venous reflux studies. He was not felt to have significant reflux but certainly has chronic venous changes of his skin with hemosiderin deposition around this area. He will definitely need to lubricate his skin and wear compression stocking and I've talked to him about this. READMISSION 05/26/2018 This is a now 71 year old man we cared for with traumatic wounds on his left anterior lower extremity. He had been previously revascularized during that admission by Dr. [name]. Apparently in follow-up Dr. [name] noted that he had deterioration in his arterial status. He underwent a stent placement in the distal left SFA on 04/22/2018. 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 He went back to the angiography suite on 04/30/2018 he underwent PTA and balloon angioplasty of the occluded left mid anterior tibial artery, thrombotic occlusion went from 100 to 0% which reconstitutes the posterior  tibial artery. He had thrombectomy and aspiration of the peroneal artery. The stent placed in the distal SFA left SFA was still occluded. 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 He has been recuperating at [place] home in [facility] after the most recent hospitalization. He is going home tomorrow. He tells me that 2 weeks ago he traumatized the tip of his left fifth  toe. He came in urgently for our review of this. This was a history of before I noted that Dr. [name] had already noted dry gangrenous changes of the left fifth toe 06/09/2018; 2-week follow-up. I did contact Dr. [name] after his last appointment and he apparently saw 1 of Dr. Irven Shelling colleagues the next day. He does not follow-up with Dr. [name] himself until Thursday of this week. 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 Nevertheless there is no evidence of infection no drainage and no pain. He had a new area that this week when we were signing him in today on the left anterior mid tibia area, this is in close proximity to the previous wound we have dealt with in this clinic. 06/23/2018; 2-week follow-up. I did not receive a recent note from Dr. [name] to review today. Our office is trying to obtain this. He is apparently not planning to do further vascular interventions and wondered about compression to try and help with the patient's chronic venous insufficiency. However we are also concerned about the arterial flow. He arrives in clinic today with a new area on the left third toe. The areas on the calf/anterior tibia are close to closing. The left fifth toe is still mummified using Betadine. -In reviewing things with the patient he has what sounds like  claudication with mild to moderate amount of activity. 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 I prescribed Levaquin over the phone while we attempted to arrange a plan of care. However the patient called yesterday to report he had low-grade fever and he came in today acutely. There is been a marked deterioration in the left third toe with spreading cellulitis up into the dorsal left foot. He was referred to the emergency room. Readmission: 06/29/2020 patient presents today for reevaluation here in our clinic he was previously treated by Dr. Dellia Nims at the latter part of 2019 in 2 the beginning of 2020. Subsequently we have not seen him since that time in the interim he did have evaluation with vein and vascular specialist specifically Dr. Anice Paganini who did perform quite extensive work for a left femoral to anterior tibial artery bypass. With that being said in the interim the patient has developed significant lymphedema and has wounds that he tells me have really never healed in regard to the incision site on the left leg. He also has multiple wounds on the feet for various reasons some of which is that he tends to pick at his feet. Fortunately there is no signs of active infection systemically at this time he does have some wounds that are little bit deeper but most are fairly superficial he seems to have good blood flow and overall everything appears to be healthy I see no bone exposed and no obvious signs of osteomyelitis. I do not know that he necessarily needs a x-ray at this point although that something we could consider depending on how things progress. The patient does have a history of lymphedema, diabetes, this is type II, chronic kidney disease stage III, hypertension, and history of peripheral vascular disease. 07/05/2020; patient admitted last week. Is a patient I remember from 2019 he had a spreading infection involving the left foot and we sent him to  the hospital. 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 He subsequently had a left femoral to anterior tibial bypass by Dr.Cain vein and  vascular. He also has severe lymphedema with chronic skin changes related to that on the left leg. The most problematic area that was new today was on the left medial great toe. This was apparently a small area last week there was purulent drainage which our intake nurse cultured. Also areas on the left medial foot and heel left lateral foot. He has 2 areas on the left medial calf left lateral calf in the setting of the severe lymphedema. 07/13/2020 on evaluation today patient appears to be doing better in my opinion compared to his last visit. The good news is there is no signs of active infection systemically and locally I do not see any signs of infection either. He did have an x-ray which was negative that is great news he had a culture which showed MRSA but at the same time he is been on the doxycycline which has helped. I do think we may want to extend this for 7 additional days 1/25; patient admitted to the clinic a few weeks ago. 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 We have been putting him under compression his edema control is a lot better but he is severe verricused skin on the left leg. He is really done quite well he still has an open area on the left medial calf and the left medial first metatarsal head. We have been using silver collagen on the leg silver alginate on the foot 07/27/2020 upon evaluation today patient appears to be doing decently well in regard to his wounds. He still has a lot of dry skin on the left leg. Some of this is starting to peel back and I think he may be able to have them out by removing some that today. Fortunately there is no signs of active infection at this time on the left leg although on the right leg he does appear to have swelling  and erythema as well as some mild warmth to touch. This does have been concerned about the possibility of cellulitis although within the differential diagnosis I do think that potentially a DVT has to be at least considered. We need to rule that out before proceeding would just call in the cellulitis. Especially since he is having pain in the posterior aspect of his calf muscle. 2/8; the patient had seen sparingly. He has severe skin changes of chronic lymphedema in the left leg thickened hyperkeratotic verrucous skin. He has an open wound on the medial part of the left first met head left mid tibia. He also has a rim of nonepithelialized skin in the anterior mid tibia. He brought in the AmLactin lotion that was been prescribed although I am not sure under compression and its utility. There concern about cellulitis on the right lower leg the last time he was here. He was put on on antibiotics. His DVT rule out was negative. The right leg looks fine he is using his stocking on this area 08/10/2020 upon evaluation today patient appears to be doing well with regard to his leg currently. He has been tolerating the dressing changes without complication. Fortunately there is no signs of active infection which is great news. Overall very pleased with where things stand. 2/22; the patient still has an area on the medial part of the left first met his head. This looks better than when I last saw this earlier this month he has a rim of epithelialization but still some surface debris. Mostly everything on the  left leg is healed. There is still a vulnerable in the left mid tibia area. 08/30/2020 upon evaluation today patient appears to be doing much better in regard to his wounds on his foot. 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 Nonetheless I think we will address that today  I Minna send in a prescription for him in that regard. Overall though there does not appear to be any signs of significant worsening. 09/07/2020 on evaluation today patient's wounds over his left foot appear to be doing excellent. I do not see any signs of infection there is some callus buildup this can require debridement for certain but overall I feel like he is managing quite nicely. He still using the AmLactin cream which has been beneficial for him as well. 3/22; left foot wound is closed. There is no open area here. He is using ammonium lactate lotion to the lower extremities to help exfoliate dry cracked skin. He has compression stockings from elastic therapy in [place]. The wound on the medial part of his left first met head is healed today. READMISSION 04/12/2021 Mr. Maker is a patient we know fairly well he had a prolonged stay in clinic in 2019 with wounds on his left lateral and left anterior lower extremity in the setting of chronic venous insufficiency. More recently he was here earlier this year with predominantly an area on his left foot first metatarsal head plantar and he says the plantar foot broke down on its not long after we discharged him but he did not come back here. The last few months areas of broken down on his left anterior and again the left lateral lower extremity. The leg itself is very swollen chronically enlarged a lot of hyperkeratotic dry Berry Q skin in the left lower leg. His edema extends well into the thigh. He was seen by Dr. Donzetta Matters. He had ABIs on 03/02/2021 showing an ABI on the right of 1 with a TBI of 0.72 his ABI in the left at 1.09 TBI of 0.99. Monophasic and biphasic waveforms on the right. On the left monophasic waveforms were noted he went on to have an angiogram on 03/27/2021 this showed the aortic aortic and iliac segments were free of flow-limiting stenosis the left common femoral vein to evaluate the left femoral to anterior tibial artery bypass  was unobstructed the bypass was patent without any areas of stenosis. We discharged the patient in bilateral juxta lite stockings but very clearly that was not sufficient to control the swelling and maintain skin integrity. He is clearly going to need compression pumps. 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 This is fortunate because he is on his feet for long periods of time. 10/27; patient comes in with our intake nurse reporting copious amount of green drainage from the left anterior mid tibia the left dorsal foot and to a lesser extent the left medial mid tibia. We left the compression wrap on all week for the amount of edema in his left leg is quite a bit better. We use silver alginate as the primary dressing 11/3; edema control is good. Left anterior lower leg left medial lower leg and the plantar first metatarsal head. The left anterior lower leg required debridement. Deep tissue culture I did of this wound showed  MRSA I put him on 10 days of doxycycline which she will start today. We have him in compression wraps. He has a security card and [employer] however he is retiring on November 15. We will need to then get him into a better offloading boot for the left foot perhaps a total contact cast 11/10; edema control is quite good. Left anterior and left medial lower leg wounds in the setting of chronic venous insufficiency and lymphedema. He also has a substantial area over the left plantar first metatarsal head. 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 He has significant hypergranulation on the left plantar foot wound. The patient is a diabetic but he does not have significant PAD 11/17; edema control is quite good. Left anterior and left medial lower leg wounds look better. The really concerning area remains the area on the left plantar first  metatarsal head. He has a rim of epithelialization. 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 Starting today with a forefoot off loader but . possibly a total contact cast. 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 Arrives with a very significant odor from I think all of the wounds on the left leg and the left foot. Because of this I did not put a total contact cast on him today but will could still consider this. His wife was having cataract surgery which is the reason he missed the appointment 12/6. I saw this man 5 days ago with a swelling below the popliteal fossa. I thought he actually might have a Baker's cyst however the DVT rule out study that we could arrange right away was negative the technician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 tibial area. This is smaller. Granulation looks healthy. No need for debridement. 11/04/17; the wound on the left lateral calf looks  better. Improved dimensions. Surface of this looks better. We've been maintaining him and Kerlix Coban wraps. He finds this much more comfortable. Silver collagen dressing 11/11/17; left lateral Wound continues to look healthy be making progress. Using a #5 curet I removed removed nonviable skin from the surface of the wound and then necrotic debris from the wound surface. Surface of the wound continues to look healthy. ooHe also has an open area on the left great toenail bed. We've been using topical antibiotics. 11/19/17; left anterior lateral wound continues to look healthy but it's not closed. ooHe also had a small wound above this on the left leg ooInitially traumatic wounds in the setting of significant chronic venous insufficiency and stasis dermatitis 11/25/17; left anterior wounds superiorly is closed still a small wound inferiorly. 12/02/17; left anterior tibial area. Arrives today with adherent callus. Post debridement clearly not completely closed. Hydrofera Blue under 3 layer compression. 12/09/17; left anterior tibia. Circumferential eschar however the wound bed looks stable to improved. We've been using Hydrofera Blue under 3 layer compression 12/17/17; left anterior tibia. Apparently this was felt to be closed however when the wrap was taken off there is a skin tear to reopen wounds in the same area we've been using Hydrofera Blue under 3 layer compression 12/23/17 left anterior tibia. Not close to close this week apparently the [facility] was stuck to this again. Still circumferential eschar requiring debridement. I put a contact layer on this this time under the Hydrofera Blue 12/31/17; left anterior tibia. Wound is better slight amount of hyper-granulation. Using Hydrofera Blue over Adaptic. 01/07/18; left anterior tibia. The wound had some surface eschar however after this was removed he has no open wound.he was already revascularized by Dr. [name] when he came to our clinic with  atherectomy of the left SFA and popliteal artery. He was also sent to interventional radiology for venous reflux studies. He was not felt to have significant reflux but certainly has chronic venous changes of his skin with hemosiderin deposition around this area. He will definitely need to lubricate his skin and wear compression stocking and I've talked to him about this. READMISSION 05/26/2018 This is a now 71 year old man we cared for with traumatic wounds on his left anterior lower extremity. He had been previously revascularized during that admission by Dr. [name]. Apparently in follow-up Dr. [name] noted that he had deterioration in his arterial status. He underwent a stent placement in the distal left SFA on 04/22/2018. 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 He went back to the angiography suite on 04/30/2018 he underwent PTA and balloon angioplasty of the occluded left mid anterior tibial artery, thrombotic  occlusion went from 100 to 0% which reconstitutes the posterior tibial artery. He had thrombectomy and aspiration of the peroneal artery. The stent placed in the distal SFA left SFA was still occluded. 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 He has been recuperating at [facility] in [facility] after the most recent hospitalization. He is going home tomorrow. He tells me that 2 weeks ago he traumatized the tip of his left fifth toe. He came in urgently for our review of this. This was a history of before I noted that Dr. [name] had already noted dry gangrenous changes of the left fifth toe 06/09/2018; 2-week follow-up. I did contact Dr. [name] after his last appointment and he apparently saw 1 of Dr. Irven Shelling colleagues  the next day. He does not follow-up with Dr. [name] himself until Thursday of this week. 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 Nevertheless there is no evidence of infection no drainage and no pain. He had a new area that this week when we were signing him in today on the left anterior mid tibia area, this is in close proximity to the previous wound we have dealt with in this clinic. 06/23/2018; 2-week follow-up. I did not receive a recent note from Dr. [name] to review today. Our office is trying to obtain this. He is apparently not planning to do further vascular interventions and wondered about compression to try and help with the patient's chronic venous insufficiency. However we are also concerned about the arterial flow. ooHe arrives in clinic today with a new area on the left third toe. The areas on the calf/anterior tibia are close to closing. The left fifth toe is still mummified using Betadine. -In reviewing things with the patient he has what sounds like claudication with mild to moderate amount of activity. 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 I prescribed Levaquin over the phone while we attempted to arrange a plan of care. However the patient called yesterday to report he had low-grade fever and he came in today acutely. There is been a marked deterioration in the left third toe with spreading cellulitis up into the dorsal left foot. He was referred to the emergency room. Readmission: 06/29/2020 patient presents today for reevaluation here in our clinic he was previously treated by Dr. Dellia Nims at the latter part of 2019 in 2 the beginning of 2020. Subsequently we have not seen him since that time in the interim he did have evaluation with vein and vascular specialist specifically Dr. Anice Paganini who did perform quite extensive work for a left femoral to anterior tibial artery bypass. With that being said in the interim the  patient has developed significant lymphedema and has wounds that he tells me have really never healed in regard to the incision site on the left leg. He also has multiple wounds on the feet for various reasons some of which is that he tends to pick at his feet. Fortunately there is no signs of active infection systemically at this time he does have some wounds that are little bit deeper but most are fairly superficial he seems to have good blood flow and overall everything appears  to be healthy I see no bone exposed and no obvious signs of osteomyelitis. I do not know that he necessarily needs a x-ray at this point although that something we could consider depending on how things progress. The patient does have a history of lymphedema, diabetes, this is type II, chronic kidney disease stage III, hypertension, and history of peripheral vascular disease. 07/05/2020; patient admitted last week. Is a patient I remember from 2019 he had a spreading infection involving the left foot and we sent him to the hospital. 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 He subsequently had a left femoral to anterior tibial bypass by Dr.Cain vein and vascular. He also has severe lymphedema with chronic skin changes related to that on the left leg. The most problematic area that was new today was on the left medial great toe. This was apparently a small area last week there was purulent drainage which our intake nurse cultured. Also areas on the left medial foot and heel left lateral foot. He has 2 areas on the left medial calf left lateral calf in the setting of the severe lymphedema. 07/13/2020 on evaluation today patient appears to be doing better in my opinion compared to his last visit. The good news is there is no signs of active infection systemically and locally I do not see any signs of infection either. He did have an x-ray which was negative that is great news he had a  culture which showed MRSA but at the same time he is been on the doxycycline which has helped. I do think we may want to extend this for 7 additional days 1/25; patient admitted to the clinic a few weeks ago. 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 We have been putting him under compression his edema control is a lot better but he is severe verricused skin on the left leg. He is really done quite well he still has an open area on the left medial calf and the left medial first metatarsal head. We have been using silver collagen on the leg silver alginate on the foot 07/27/2020 upon evaluation today patient appears to be doing decently well in regard to his wounds. He still has a lot of dry skin on the left leg. Some of this is starting to peel back and I think he may be able to have them out by removing some that today. Fortunately there is no signs of active infection at this time on the left leg although on the right leg he does appear to have swelling and erythema as well as some mild warmth to touch. This does have been concerned about the possibility of cellulitis although within the differential diagnosis I do think that potentially a DVT has to be at least considered. We need to rule that out before proceeding would just call in the cellulitis. Especially since he is having pain in the posterior aspect of his calf muscle. 2/8; the patient had seen sparingly. He has severe skin changes of chronic lymphedema in the left leg thickened hyperkeratotic verrucous skin. He has an open wound on the medial part of the left first met head left mid tibia. He also has a rim of nonepithelialized skin in the anterior mid tibia. He brought in the AmLactin lotion that was been prescribed although I am not sure under compression and its utility. There concern about cellulitis on the right lower  leg the last time he was here. He was put on on antibiotics. His DVT rule  out was negative. The right leg looks fine he is using his stocking on this area 08/10/2020 upon evaluation today patient appears to be doing well with regard to his leg currently. He has been tolerating the dressing changes without complication. Fortunately there is no signs of active infection which is great news. Overall very pleased with where things stand. 2/22; the patient still has an area on the medial part of the left first met his head. This looks better than when I last saw this earlier this month he has a rim of epithelialization but still some surface debris. Mostly everything on the left leg is healed. There is still a vulnerable in the left mid tibia area. 08/30/2020 upon evaluation today patient appears to be doing much better in regard to his wounds on his foot. 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 Nonetheless I think we will address that today I Minna send in a prescription for him in that regard. Overall though there does not appear to be any signs of significant worsening. 09/07/2020 on evaluation today patient's wounds over his left foot appear to be doing excellent. I do not see any signs of infection there is some callus buildup this can require debridement for certain but overall I feel like he is managing quite nicely. He still using the AmLactin cream which has been beneficial for him as well. 3/22; left foot wound is closed. There is no open area here. He is using ammonium lactate lotion to the lower extremities to help exfoliate dry cracked skin. He has compression stockings from elastic therapy in [place]. The wound on the medial part of his left first met head is healed today. READMISSION 04/12/2021 Mr. Gilcrease is a patient we know fairly well he had a prolonged stay in clinic in 2019 with wounds on his left lateral and left anterior  lower extremity in the setting of chronic venous insufficiency. More recently he was here earlier this year with predominantly an area on his left foot first metatarsal head plantar and he says the plantar foot broke down on its not long after we discharged him but he did not come back here. The last few months areas of broken down on his left anterior and again the left lateral lower extremity. The leg itself is very swollen chronically enlarged a lot of hyperkeratotic dry Berry Q skin in the left lower leg. His edema extends well into the thigh. He was seen by Dr. Donzetta Matters. He had ABIs on 03/02/2021 showing an ABI on the right of 1 with a TBI of 0.72 his ABI in the left at 1.09 TBI of 0.99. Monophasic and biphasic waveforms on the right. On the left monophasic waveforms were noted he went on to have an angiogram on 03/27/2021 this showed the aortic aortic and iliac segments were free of flow-limiting stenosis the left common femoral vein to evaluate the left femoral to anterior tibial artery bypass was unobstructed the bypass was patent without any areas of stenosis. We discharged the patient in bilateral juxta lite stockings but very clearly that was not sufficient to control the swelling and maintain skin integrity. He is clearly going to need compression pumps. The patient is a security guard at a [employer] but he is telling me  he is going to retire in 25 days. This is fortunate because he is on his feet for long periods of time. 10/27; patient comes in with our intake nurse reporting copious amount of green drainage from the left anterior mid tibia the left dorsal foot and to a lesser extent the left medial mid tibia. We left the compression wrap on all week for the amount of edema in his left leg is quite a bit better. We use silver alginate as the primary dressing 11/3; edema control is good. Left anterior lower leg left medial lower leg and the plantar first metatarsal head. The left anterior lower leg  required debridement. Deep tissue culture I did of this wound showed MRSA I put him on 10 days of doxycycline which she will start today. We have him in compression wraps. He has a security card and [employer] however he is retiring on November 15. We will need to then get him into a better offloading boot for the left foot perhaps a total contact cast 11/10; edema control is quite good. Left anterior and left medial lower leg wounds in the setting of chronic venous insufficiency and lymphedema. He also has a substantial area over the left plantar first metatarsal head. 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 He has significant hypergranulation on the left plantar foot wound. The patient is a diabetic but he does not have significant PAD 11/17; edema control is quite good. Left anterior and left medial lower leg wounds look better. The really concerning area remains the area on the left plantar first metatarsal head. He has a rim of epithelialization. 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 Starting today with a forefoot off loader but . possibly a total contact cast. 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 Arrives with a very significant odor from I think all of the wounds on the left leg and the left foot. Because of this I did not put a total contact cast on him today but will could still consider this. His wife was having cataract surgery which is the reason he missed the appointment 12/6. I saw this man 5 days ago with a swelling below the popliteal fossa. I thought he actually might have a Baker's cyst however the DVT rule out study that we could arrange right away was negative  the technician told me this was not a ruptured Baker's cyst. We attempted to get this aspirated by under ultrasound guidance in interventional radiology however all they did was an ultrasound however it shows an extensive fluid collection 62 x 8 x 9.4 in the left thigh and left calf. The patient states he thinks this started 8 days ago or so but he really is not complaining of any pain, fever or systemic symptoms. He has not ha 12/20; after some difficulty I managed to get the patient into see Dr. Donzetta Matters. Eventually he was taken into the hospital and had a drain put in the fluid collection below his left knee posteriorly extending into the posterior thigh. He still has the drain in place. Culture of this showed moderate staff aureus few Morganella and few  Klebsiella he is now on doxycycline and ciprofloxacin as suggested by infectious disease he is on this for a month. The drain will remain in place until it stops draining 12/29; he comes in today with the 1 wound on his left leg and the area on the left plantar first met head significantly smaller. Both look healthy. He still has the drain in the left leg. He says he has to change this daily. Follows up with Dr. Donzetta Matters on January 11. 06/29/2021; the wounds that I am following on the left leg and left first met head continued to be quite healthy. However the area where his inferior drain is in place had copious amounts of drainage which was green in color. The wound here is larger. Follows up with Dr. Gwenlyn Saran of vein and vascular his surgeon next week as well as infectious disease. He remains on ciprofloxacin and doxycycline. He is not complaining of excessive pain in either one of the drain areas 1/12; the patient saw vascular surgery and infectious disease. Vascular surgery has left the drain in place as there was still some notable drainage still see him back in 2 weeks. Dr. Velna Ochs stop the doxycycline and ciprofloxacin and I do not believe he follows up with  them at this point. Culture I did last week showed both doxycycline resistant MRSA and Pseudomonas not sensitive to ciprofloxacin although only in rare titers 1/19; the patient's wound on the left anterior lower leg is just about healed. We have continued healing of the area that was medially on the left leg. Left first plantar metatarsal head continues to get smaller. The major problem here is his 2 drain sites 1 on the left upper calf and lateral thigh. There is purulent drainage still from the left lateral thigh. I gave him antibiotics last week but we still have recultured. He has the drain in the area I think this is eventually going to have to come out. I suspect there will be a connecting wound to heal here perhaps with improved VAc 1/26; the patient had his drain removed by vein and vascular on 1/25/. This was a large pocket of fluid in his left thigh that seem to tunnel into his left upper calf. 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 His mention his Penrose drain was removed today. He now has a tunneling wound on his left calf and left thigh. Both of these probe widely towards each other although I cannot really prove that they connect. Both wounds on his lower leg anteriorly are closed and his area over the first metatarsal head on his right foot continues to improve. We are using Hydrofera Blue here. He also saw infectious disease culture of the abscess they noted was polymicrobial with MRSA, Morganella and Klebsiella he was treated with doxycycline and ciprofloxacin for 4 weeks ending on 07/03/2021. They did not recommend any further antibiotics. Notable that while he still had the Penrose drain in place last week he had purulent drainage coming out of the inferior IandD site this grew [facility], MRSA and Pseudomonas but there does not appear to be any active infection in this area today with the drain out and he is not systemically unwell 2/2; with regards to the  drain sites the superior one on the thigh actually is closed down the one on the upper left lateral calf measures about 8 and half centimeters which is an improvement seems to be less prominent although still with a lot of  drainage. The only remaining wound is over the first metatarsal head on the left foot and this looks to be continuing to improve with Hydrofera Blue. 2/9; the area on his plantar left foot continues to contract. Callus around the wound edge. The drain sites specifically have not come down in depth. We put the wound VAC on Monday he changed the canister late last night our intake nurse reported a pocket of fluid perhaps caused by our compression wraps 2/16; continued improvement in left foot plantar wound. drainage site in the calf is not improved in terms of depth (wound vac) 2/23; continued improvement in the left foot wound over the first metatarsal head. With regards to the drain sites the area on his thigh laterally is healed however the open area on his calf is small in terms of circumference by still probes in by about 15 cm. Within using the wound VAC. Hydrofera Blue on his foot 08/24/2021: The left first metatarsal head wound continues to improve. The wound bed is healthy with just some surrounding callus. Unfortunately the open drain site on his calf remains open and tunnels at least 15 cm (the extent of a Q-tip). This is despite several weeks of wound VAC treatment. Based on reading back through the notes, there has been really no significant change in the depth of the wound, although the orifice is smaller and the more cranial wound on his thigh has closed. I suspect the tunnel tracks nearly all the way to this location. 08/31/2021: Continued improvement in the left first metatarsal head wound. There has been absolutely no improvement to the long tunnel from his open drain site on his calf. We have tried to get him into see vascular surgery sooner to consider the possibility  of simply filleting the tract open and allowing it to heal from the bottom up, likely with a wound VAC. They have not yet scheduled a sooner appointment than his current mid April 09/14/2021: He was seen by vascular surgery and they took him to the operating room last week. They opened a portion of the tunnel, but did not extend the entire length of the known open subcutaneous tract. I read Dr. Claretha Cooper operative note and it is not clear from that documentation why only a portion of the tract was opened. The heaped up granulation tissue was curetted and removed from at least some portion of the tract. They did place a wound VAC and applied an Unna boot to the leg. The ulcer on his left first metatarsal head is smaller today. The bed looks good and there is just a small amount of surrounding callus. 09/21/2021: The ulcer on his left first metatarsal head looks to be stalled. There is some callus surrounding the wound but the wound bed itself does not appear particularly dynamic. The tunnel tract on his lateral left leg seems to be roughly the same length or perhaps slightly smaller but the wound bed appears healthy with good granulation tissue. He opened up a new wound on his medial thigh and the site of a prior surgical incision. He says that he did this unconsciously in his sleep by scratching. 09/28/2021: Unfortunately, the ulcer on his left first metatarsal head has extended underneath the callus toward the dorsum of his foot. The medial thigh wounds are roughly the same. The tunnel on his lateral left leg continues to be problematic; it is longer than we are able to actually probe with a Q-tip. I am still not certain as to why  Dr. Donzetta Matters did not open this up entirely when he took the patient to the operating room. We will likely be back in the same situation with just a small superficial opening in a long unhealed tract, as the open portion is granulating in nicely. 10/02/2021: The patient was initially  scheduled for a nurse visit, but we are also applying a total contact cast today. The plantar foot wound looks clean without significant accumulated callus. We have been applying Prisma silver collagen to the site. 10/05/2021: The patient is here for his first total contact cast change. We have tried using gauze packing strips in the tunnel on his lateral leg wound, but this does not seem to be working any better than the white VAC foam. The foot ulcer looks about the same with minimal periwound callus.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

## 2021-12-27 NOTE — Progress Notes (Signed)
[name], [name] ([id number]) Visit Report for 12/25/2021 Arrival Information Details Patient Name: Date of Service: [name], [name] 12/25/2021 11:15 A M Medical Record Number: [id number] Patient Account Number: [id number] Date of Birth/Sex: Treating RN: 1951-01-08 (71 y.o. [name] Primary Care Jamani Bearce: [name] Other Clinician: Referring Abisai Coble: Treating Irean Kendricks/Extender: [name] in Treatment: 43 Visit Information History Since Last Visit Added or deleted any medications: No Patient Arrived: Ambulatory Any new allergies or adverse reactions: No Arrival Time: 11:29 Had a fall or experienced change in No Accompanied By: self activities of daily living that may affect Transfer Assistance: None risk of falls: Patient Identification Verified: Yes Signs or symptoms of abuse/neglect since last visito No Secondary Verification Process Completed: Yes Hospitalized since last visit: No Patient Requires Transmission-Based Precautions: No Implantable device outside of the clinic excluding No Patient Has Alerts: Yes cellular tissue based products placed in the center since last visit: Has Dressing in Place as Prescribed: Yes Has Compression in Place as Prescribed: Yes Pain Present Now: No Electronic Signature(s) Signed: 12/27/2021 4:36:02 PM By: Sharyn Creamer RN, BSN Entered By: Sharyn Creamer on 12/25/2021 11:30:10 -------------------------------------------------------------------------------- Compression Therapy Details Patient Name: Date of Service: [name] Garfinkel. 12/25/2021 11:15 A M Medical Record Number: [id number] Patient Account Number: [id number] Date of Birth/Sex: Treating RN: February 07, 1951 (71 y.o. [name] Primary Care Quanda Pavlicek: [name] Other Clinician: Referring Emmit Oriley: Treating Yassen Kinnett/Extender: [name] in Treatment: 36 Compression Therapy Performed for Wound Assessment: Wound #22  Left,Lateral Lower Leg Performed By: Clinician Sharyn Creamer, RN Compression Type: Double Layer Electronic Signature(s) Signed: 12/27/2021 4:36:02 PM By: Sharyn Creamer RN, BSN Entered By: Sharyn Creamer on 12/25/2021 11:35:56 -------------------------------------------------------------------------------- Encounter Discharge Information Details Patient Name: Date of Service: [name] Garfinkel. 12/25/2021 11:15 A M Medical Record Number: [id number] Patient Account Number: [id number] Date of Birth/Sex: Treating RN: 1950/11/23 (71 y.o. [name] Primary Care Shaquana Buel: [name] Other Clinician: Referring Madesyn Ast: Treating Angellee Cohill/Extender: [name] in Treatment: 36 Encounter Discharge Information Items Discharge Condition: Stable Ambulatory Status: Ambulatory Discharge Destination: Home Transportation: Private Auto Accompanied By: self Schedule Follow-up Appointment: Yes Clinical Summary of Care: Patient Declined Electronic Signature(s) Signed: 12/27/2021 4:36:02 PM By: Sharyn Creamer RN, BSN Entered By: Sharyn Creamer on 12/25/2021 11:53:26 -------------------------------------------------------------------------------- Negative Pressure Wound Therapy Maintenance (NPWT) Details Patient Name: Date of Service: [name], [name] 12/25/2021 11:15 A M Medical Record Number: [id number] Patient Account Number: [id number] Date of Birth/Sex: Treating RN: 1950-12-22 (70 y.o. [name] Primary Care Daphnie Venturini: [name] Other Clinician: Referring Wilsie Kern: Treating Hollis Tuller/Extender: [name] in Treatment: 36 NPWT Maintenance Performed for: Wound #22 Left, Lateral Lower Leg Performed By: Sharyn Creamer, RN Type: VAC System Coverage Size (sq cm): 20.9 Pressure Type: Constant Pressure Setting: 125 mmHG Drain Type: None Sponge/Dressing Type: Combination : white in tunnel, black Date Initiated: 07/27/2021 Dressing  Removed: No Quantity of Sponges/Gauze Removed: 2 Canister Changed: No Dressing Reapplied: No Quantity of Sponges/Gauze Inserted: 1 whit and 1 black Respones T Treatment: o good Days On NPWT : 152 Electronic Signature(s) Signed: 12/27/2021 4:36:02 PM By: Sharyn Creamer RN, BSN Entered By: Sharyn Creamer on 12/25/2021 11:55:11 -------------------------------------------------------------------------------- Patient/Caregiver Education Details Patient Name: Date of Service: [name] Garfinkel 7/3/2023andnbsp11:15 A M Medical Record Number: [id number] Patient Account Number: [id number] Date of Birth/Gender: Treating RN: Oct 18, 1950 (71 y.o. [name] Primary Care Physician: [name] Other Clinician: Referring Physician: Treating Physician/Extender: [name],  [name] in Treatment: 36 Education Assessment Education Provided To: Patient Education Topics Provided Venous: Methods: Explain/Verbal Responses: Reinforcements needed, State content correctly Wound/Skin Impairment: Methods: Explain/Verbal Responses: Reinforcements needed, State content correctly Electronic Signature(s) Signed: 12/27/2021 4:36:02 PM By: Sharyn Creamer RN, BSN Entered By: Sharyn Creamer on 12/25/2021 11:53:10 -------------------------------------------------------------------------------- Wound Assessment Details Patient Name: Date of Service: [name] Garfinkel. 12/25/2021 11:15 A M Medical Record Number: [id number] Patient Account Number: [id number] Date of Birth/Sex: Treating RN: Sep 20, 1950 (71 y.o. [name] Primary Care Toluwanimi Radebaugh: [name] Other Clinician: Referring Dione Mccombie: Treating Sade Hollon/Extender: [name] in Treatment: 36 Wound Status Wound Number: 18 Primary Etiology: Diabetic Wound/Ulcer of the Lower Extremity Wound Location: Left, Plantar Metatarsal head first Wound Status: Open Wounding Event: Gradually Appeared Date  Acquired: 08/23/2020 Weeks Of Treatment: 36 Clustered Wound: No Wound Measurements Length: (cm) 0.2 Width: (cm) 0.2 Depth: (cm) 0.1 Area: (cm) 0.031 Volume: (cm) 0.003 % Reduction in Area: 99.8% % Reduction in Volume: 99.9% Wound Description Classification: Grade 2 Exudate Amount: Medium Exudate Type: Serosanguineous Exudate Color: red, brown Treatment Notes Wound #18 (Metatarsal head first) Wound Laterality: Plantar, Left Cleanser Soap and Water Discharge Instruction: May shower and wash wound with dial antibacterial soap and water prior to dressing change. Wound Cleanser Discharge Instruction: Cleanse the wound with wound cleanser prior to applying a clean dressing using gauze sponges, not tissue or cotton balls. Peri-Wound Care Triamcinolone 15 (g) Discharge Instruction: Use triamcinolone 15 (g) as directed Sween Lotion (Moisturizing lotion) Discharge Instruction: Apply moisturizing lotion as directed Topical Primary Dressing Promogran Prisma Matrix, 4.34 (sq in) (silver collagen) Discharge Instruction: Moisten collagen with saline or hydrogel Secondary Dressing Woven Gauze Sponge, Non-Sterile 4x4 in Discharge Instruction: Apply over primary dressing as directed. Secured With 37M Medipore H Soft Cloth Surgical T ape, 4 x 10 (in/yd) Discharge Instruction: Secure with tape as directed. Compression Wrap Compression Stockings Add-Ons Electronic Signature(s) Signed: 12/27/2021 4:36:02 PM By: Sharyn Creamer RN, BSN Entered By: Sharyn Creamer on 12/25/2021 11:31:15 -------------------------------------------------------------------------------- Wound Assessment Details Patient Name: Date of Service: [name] Garfinkel. 12/25/2021 11:15 A M Medical Record Number: [id number] Patient Account Number: [id number] Date of Birth/Sex: Treating RN: March 29, 1951 (71 y.o. [name] Primary Care Makara Lanzo: [name] Other Clinician: Referring Vandell Kun: Treating  Joseguadalupe Stan/Extender: [name] in Treatment: 36 Wound Status Wound Number: 22 Primary Etiology: Cyst Wound Location: Left, Lateral Lower Leg Wound Status: Open Wounding Event: Bump Date Acquired: 06/03/2021 Weeks Of Treatment: 29 Clustered Wound: No Wound Measurements Length: (cm) 9.5 Width: (cm) 2.2 Depth: (cm) 0.1 Area: (cm) 16.415 Volume: (cm) 1.641 % Reduction in Area: -895.5% % Reduction in Volume: -24.4% Wound Description Classification: Full Thickness With Exposed Support Structure Exudate Amount: Medium Exudate Type: Serosanguineous Exudate Color: red, brown s Treatment Notes Wound #22 (Lower Leg) Wound Laterality: Left, Lateral Cleanser Soap and Water Discharge Instruction: May shower and wash wound with dial antibacterial soap and water prior to dressing change. Wound Cleanser Discharge Instruction: Cleanse the wound with wound cleanser prior to applying a clean dressing using gauze sponges, not tissue or cotton balls. Peri-Wound Care Triamcinolone 15 (g) Discharge Instruction: Use triamcinolone 15 (g) as directed Sween Lotion (Moisturizing lotion) Discharge Instruction: Apply moisturizing lotion as directed Topical Primary Dressing VAC Secondary Dressing Secured With Compression Wrap CoFlex TLC XL 2-layer Compression System 4x7 (in/yd) Discharge Instruction: Apply CoFlex 2-layer compression as directed. (alt for 4 layer) Compression Stockings Add-Ons Electronic Signature(s) Signed: 12/27/2021 4:36:02 PM By: Sharyn Creamer RN,  BSN Entered By: Sharyn Creamer on 12/25/2021 11:31:15 -------------------------------------------------------------------------------- Wound Assessment Details Patient Name: Date of Service: [name], [name] 12/25/2021 11:15 A M Medical Record Number: [id number] Patient Account Number: [id number] Date of Birth/Sex: Treating RN: 1951/06/03 (71 y.o. [name] Primary Care Carvel Huskins: [name] Other Clinician: Referring Marsia Cino: Treating Bren Borys/Extender: [name] in Treatment: 36 Wound Status Wound Number: [id number] Primary Etiology: Abrasion Wound Location: Left, Medial Upper Leg Wound Status: Open Wounding Event: Trauma Date Acquired: 09/14/2021 Weeks Of Treatment: 13 Clustered Wound: Yes Wound Measurements Length: (cm) 0.3 Width: (cm) 0.3 Depth: (cm) 0.1 Area: (cm) 0.071 Volume: (cm) 0.007 % Reduction in Area: 95.7% % Reduction in Volume: 95.8% Wound Description Classification: Full Thickness Without Exposed Support Structu Exudate Amount: Small Exudate Type: Serosanguineous Exudate Color: red, brown res Treatment Notes Wound #24R (Upper Leg) Wound Laterality: Left, Medial Cleanser Soap and Water Discharge Instruction: May shower and wash wound with dial antibacterial soap and water prior to dressing change. Wound Cleanser Discharge Instruction: Cleanse the wound with wound cleanser prior to applying a clean dressing using gauze sponges, not tissue or cotton balls. Peri-Wound Care Triamcinolone 15 (g) Discharge Instruction: Use triamcinolone 15 (g) as directed Sween Lotion (Moisturizing lotion) Discharge Instruction: Apply moisturizing lotion as directed Topical Primary Dressing KerraCel Ag Gelling Fiber Dressing, 2x2 in (silver alginate) Discharge Instruction: Apply silver alginate to wound bed as instructed Secondary Dressing Woven Gauze Sponge, Non-Sterile 4x4 in Discharge Instruction: Apply over primary dressing as directed. Secured With Elastic Bandage 4 inch (ACE bandage) Discharge Instruction: Secure with ACE bandage as directed. Compression Wrap Compression Stockings Add-Ons Electronic Signature(s) Signed: 12/27/2021 4:36:02 PM By: Sharyn Creamer RN, BSN Entered By: Sharyn Creamer on 12/25/2021 11:31:15 -------------------------------------------------------------------------------- Vitals Details Patient  Name: Date of Service: [name] Garfinkel. 12/25/2021 11:15 A M Medical Record Number: [id number] Patient Account Number: [id number] Date of Birth/Sex: Treating RN: Mar 02, 1951 (71 y.o. [name] Primary Care Arika Mainer: [name] Other Clinician: Referring Kadar Chance: Treating Evalyse Stroope/Extender: [name] in Treatment: 36 Vital Signs Time Taken: 11:30 Temperature (F): 98.8 Height (in): 74 Pulse (bpm): 69 Weight (lbs): 238 Respiratory Rate (breaths/min): 18 Body Mass Index (BMI): 30.6 Blood Pressure (mmHg): 163/77 Capillary Blood Glucose (mg/dl): 214 Reference Range: 80 - 120 mg / dl Electronic Signature(s) Signed: 12/27/2021 4:36:02 PM By: Sharyn Creamer RN, BSN Entered By: Sharyn Creamer on 12/25/2021 11:30:41

## 2021-12-27 NOTE — Progress Notes (Signed)
MEHDI, GIRONDA ([id number]) Visit Report for 12/25/2021 SuperBill Details Patient Name: Date of Service: CHANE, COWDEN 12/25/2021 Medical Record Number: [id number] Patient Account Number: [id number] Date of Birth/Sex: Treating RN: 03/14/1951 (71 y.o. 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

## 2021-12-28 ENCOUNTER — Other Ambulatory Visit: Payer: Self-pay

## 2021-12-28 ENCOUNTER — Encounter (HOSPITAL_BASED_OUTPATIENT_CLINIC_OR_DEPARTMENT_OTHER): Payer: Medicare Other | Admitting: General Surgery

## 2021-12-28 DIAGNOSIS — I779 Disorder of arteries and arterioles, unspecified: Secondary | ICD-10-CM

## 2021-12-28 DIAGNOSIS — E1151 Type 2 diabetes mellitus with diabetic peripheral angiopathy without gangrene: Secondary | ICD-10-CM | POA: Diagnosis not present

## 2021-12-28 DIAGNOSIS — I739 Peripheral vascular disease, unspecified: Secondary | ICD-10-CM

## 2021-12-28 NOTE — Progress Notes (Signed)
AUGUSTINE, LEVERETTE ([id number]) Visit Report for 12/28/2021 Arrival Information Details Patient Name: Date of Service: [name], [name] 12/28/2021 10:15 A M Medical Record Number: [id number] Patient Account Number: [id number] Date of Birth/Sex: Treating RN: 1950/10/18 (71 y.o. [name] Primary Care Jesika Men: [name] Other Clinician: Referring Behr Cislo: Treating Sherolyn Trettin/Extender: [name] in Treatment: 55 Visit Information History Since Last Visit Added or deleted any medications: No Patient Arrived: Ambulatory Any new allergies or adverse reactions: No Arrival Time: 10:14 Had a fall or experienced change in No Accompanied By: self activities of daily living that may affect Transfer Assistance: None risk of falls: Patient Identification Verified: Yes Signs or symptoms of abuse/neglect since last visito No Secondary Verification Process Completed: Yes Hospitalized since last visit: No Patient Requires Transmission-Based Precautions: No Implantable device outside of the clinic excluding No Patient Has Alerts: Yes cellular tissue based products placed in the center since last visit: Has Dressing in Place as Prescribed: Yes Has Compression in Place as Prescribed: Yes Pain Present Now: No Electronic Signature(s) Signed: 12/28/2021 5:06:25 PM By: Baruch Gouty RN, BSN Entered By: Baruch Gouty on 12/28/2021 10:24:21 -------------------------------------------------------------------------------- Compression Therapy Details Patient Name: Date of Service: [name]. 12/28/2021 10:15 A M Medical Record Number: [id number] Patient Account Number: [id number] Date of Birth/Sex: Treating RN: Jul 08, 1950 (71 y.o. [name] Primary Care Stellarose Cerny: [name] Other Clinician: Referring Pharell Rolfson: Treating Rika Daughdrill/Extender: [name] in Treatment: 37 Compression Therapy Performed for Wound Assessment: Wound #22  Left,Lateral Lower Leg Performed By: Clinician Baruch Gouty, RN Compression Type: Double Layer Post Procedure Diagnosis Same as Pre-procedure Electronic Signature(s) Signed: 12/28/2021 5:06:25 PM By: Baruch Gouty RN, BSN Entered By: Baruch Gouty on 12/28/2021 10:49:39 -------------------------------------------------------------------------------- Lower Extremity Assessment Details Patient Name: Date of Service: [name]. 12/28/2021 10:15 A M Medical Record Number: [id number] Patient Account Number: [id number] Date of Birth/Sex: Treating RN: 05/06/1951 (71 y.o. [name] Primary Care Ciclaly Mulcahey: [name] Other Clinician: Referring Kassity Woodson: Treating Jette Lewan/Extender: [name] in Treatment: 37 Edema Assessment Assessed: [name]: No] [name]: No] Edema: [Left: Ye] [Right: s] Calf Left: Right: Point of Measurement: 41 cm From Medial Instep 42 cm Ankle Left: Right: Point of Measurement: 10 cm From Medial Instep 28.4 cm Vascular Assessment Pulses: Dorsalis Pedis Palpable: [Left:Yes] Electronic Signature(s) Signed: 12/28/2021 5:06:25 PM By: Baruch Gouty RN, BSN Entered By: Baruch Gouty on 12/28/2021 10:34:27 -------------------------------------------------------------------------------- Multi Wound Chart Details Patient Name: Date of Service: [name]. 12/28/2021 10:15 A M Medical Record Number: [id number] Patient Account Number: [id number] Date of Birth/Sex: Treating RN: 07/22/1950 (71 y.o. [name] Primary Care Elhadji Pecore: [name] Other Clinician: Referring Tonjua Rossetti: Treating Sarahi Borland/Extender: [name] in Treatment: 37 Vital Signs Height(in): 74 Capillary Blood Glucose(mg/dl): 182 Weight(lbs): 238 Pulse(bpm): 80 Body Mass Index(BMI): 30.6 Blood Pressure(mmHg): 134/80 Temperature(F): 98.1 Respiratory Rate(breaths/min): 18 Photos: [18:Left, Plantar Metatarsal head  first] [22:Left, Lateral Lower Leg] [24R:Left, Medial Upper Leg] Wound Location: [18:Gradually Appeared] [22:Bump] [24R:Trauma] Wounding Event: [18:Diabetic Wound/Ulcer of the Lower] [22:Cyst] [24R:Abrasion] Primary Etiology: [18:Extremity Glaucoma, Sleep Apnea,] [22:Glaucoma, Sleep Apnea,] [24R:Glaucoma, Sleep Apnea,] Comorbid History: [18:Hypertension, Peripheral Arterial Disease, Peripheral Venous Disease, Disease, Peripheral Venous Disease, Disease, Peripheral Venous Disease, Type II Diabetes, Gout, Osteoarthritis, Neuropathy 08/23/2020] [22:Hypertension, Peripheral  Arterial Neuropathy 06/03/2021] [24R:Hypertension, Peripheral Arterial Type II Diabetes, Gout, Osteoarthritis, Neuropathy 09/14/2021] Date Acquired: [18:37] [22:29] [24R:14] Weeks of Treatment: [18:Open] [22:Open] [24R:Healed - Epithelialized] Wound Status: [18:No] [22:No] [24R:Yes] Wound Recurrence: [  18:No] [22:No] [24R:Yes] Clustered Wound: [18:0.2x0.7x0.1] [22:8.8x2.6x0.1] [24R:0x0x0] Measurements L x W x D (cm) [18:0.11] [22:17.97] [24R:0] A (cm) : rea [18:0.011] [22:1.797] [24R:0] Volume (cm) : [18:99.20%] [22:-989.80%] [24R:100.00%] % Reduction in A rea: [18:99.60%] [22:-36.20%] [24R:100.00%] % Reduction in Volume: [22:3] Position 1 (o'clock): [22:0.5] Maximum Distance 1 (cm): [18:No] [22:Yes] [24R:No] Tunneling: [18:Grade 2] [22:Full Thickness With Exposed Support Full Thickness Without Exposed] Classification: [18:Small] [22:Structures Medium] [24R:Support Structures None Present] Exudate Amount: [18:Serosanguineous] [22:Serosanguineous] [24R:N/A] Exudate Type: [18:red, brown] [22:red, brown] [24R:N/A] Exudate Color: [18:Flat and Intact] [22:Flat and Intact] [24R:N/A] Wound Margin: [18:Large (67-100%)] [22:Large (67-100%)] [24R:None Present (0%)] Granulation Amount: [18:Red] [22:Red] [24R:N/A] Granulation Quality: [18:None Present (0%)] [22:None Present (0%)] [24R:None Present (0%)] Necrotic Amount: [18:Fat Layer  (Subcutaneous Tissue): Yes Fat Layer (Subcutaneous Tissue): Yes Fascia: No] Exposed Structures: [18:Fascia: No Tendon: No Muscle: No Joint: No Bone: No Medium (34-66%)] [22:Fascia: No Tendon: No Muscle: No Joint: No Bone: No Small (1-33%)] [24R:Fat Layer (Subcutaneous Tissue): No Tendon: No Muscle: No Joint: No Bone: No Large (67-100%)] Epithelialization: [18:N/A] [22:Compression Therapy] [24R:N/A] Procedures Performed: [22:Negative Pressure Wound Therapy Maintenance (NPWT)] Treatment Notes Electronic Signature(s) Signed: 12/28/2021 11:02:41 AM By: Fredirick Maudlin MD FACS Signed: 12/28/2021 5:06:25 PM By: Baruch Gouty RN, BSN Entered By: Fredirick Maudlin on 12/28/2021 11:02:40 -------------------------------------------------------------------------------- Multi-Disciplinary Care Plan Details Patient Name: Date of Service: [name]. 12/28/2021 10:15 A M Medical Record Number: [id number] Patient Account Number: [id number] Date of Birth/Sex: Treating RN: Apr 01, 1951 (71 y.o. [name] Primary Care Yarisbel [name]: [name] Other Clinician: Referring Jonnathan Birman: Treating Nihar Klus/Extender: [name] in Treatment: 37 Multidisciplinary Care Plan reviewed with physician Active Inactive Venous Leg Ulcer Nursing Diagnoses: Knowledge deficit related to disease process and management Potential for venous Insuffiency (use before diagnosis confirmed) Goals: Patient will maintain optimal edema control Date Initiated: 07/27/2021 Target Resolution Date: 01/17/2022 Goal Status: Active Interventions: Assess peripheral edema status every visit. Treatment Activities: Therapeutic compression applied : 07/27/2021 Notes: Electronic Signature(s) Signed: 12/28/2021 5:06:25 PM By: Baruch Gouty RN, BSN Entered By: Baruch Gouty on 12/28/2021 10:43:35 -------------------------------------------------------------------------------- Negative Pressure Wound Therapy  Maintenance (NPWT) Details Patient Name: Date of Service: DANGELO, [name] 12/28/2021 10:15 A M Medical Record Number: [id number] Patient Account Number: [id number] Date of Birth/Sex: Treating RN: 1951-02-17 (71 y.o. [name] Primary Care Tajuan Dufault: [name] Other Clinician: Referring Ryden Wainer: Treating Royer Cristobal/Extender: [name] in Treatment: 37 NPWT Maintenance Performed for: Wound #22 Left, Lateral Lower Leg Additional Injuries Covered: No Performed By: Baruch Gouty, RN Type: VAC System Coverage Size (sq cm): 22.88 Pressure Type: Constant Pressure Setting: 125 mmHG Drain Type: None Sponge/Dressing Type: Foam, Black Date Initiated: 07/27/2021 Dressing Removed: No Quantity of Sponges/Gauze Removed: 1 Canister Changed: No Dressing Reapplied: No Quantity of Sponges/Gauze Inserted: 1 Respones T Treatment: o goon Days On NPWT : 155 Post Procedure Diagnosis Same as Pre-procedure Electronic Signature(s) Signed: 12/28/2021 5:06:25 PM By: Baruch Gouty RN, BSN Entered By: Baruch Gouty on 12/28/2021 10:49:20 -------------------------------------------------------------------------------- Pain Assessment Details Patient Name: Date of Service: [name]. 12/28/2021 10:15 A M Medical Record Number: [id number] Patient Account Number: [id number] Date of Birth/Sex: Treating RN: 10/11/1950 (71 y.o. [name] Primary Care Kooper Chriswell: [name] Other Clinician: Referring Petrita Blunck: Treating Matthew Pais/Extender: [name] in Treatment: 37 Active Problems Location of Pain Severity and Description of Pain Patient Has Paino No Site Locations Rate the pain. Current Pain Level: 0 Pain Management and Medication Current Pain Management: Electronic Signature(s) Signed: 12/28/2021  5:06:25 PM By: Baruch Gouty RN, BSN Entered By: Baruch Gouty on 12/28/2021  10:29:13 -------------------------------------------------------------------------------- Patient/Caregiver Education Details Patient Name: Date of Service: [name], [name] 7/6/2023andnbsp10:15 A M Medical Record Number: [id number] Patient Account Number: [id number] Date of Birth/Gender: Treating RN: 1950/10/29 (71 y.o. [name] Primary Care Physician: [name] Other Clinician: Referring Physician: Treating Physician/Extender: [name] in Treatment: 70 Education Assessment Education Provided To: Patient Education Topics Provided Venous: Methods: Explain/Verbal Responses: Reinforcements needed, State content correctly Wound/Skin Impairment: Methods: Explain/Verbal Responses: Reinforcements needed, State content correctly Electronic Signature(s) Signed: 12/28/2021 5:06:25 PM By: Baruch Gouty RN, BSN Entered By: Baruch Gouty on 12/28/2021 10:43:58 -------------------------------------------------------------------------------- Wound Assessment Details Patient Name: Date of Service: [name]. 12/28/2021 10:15 A M Medical Record Number: [id number] Patient Account Number: [id number] Date of Birth/Sex: Treating RN: [birth year] (71 y.o. [name] Primary Care Parlee Amescua: [name] Other Clinician: Referring Lizett Chowning: Treating Leighton Brickley/Extender: [name] Wound Status Wound Number: 18 Primary Diabetic Wound/Ulcer of the Lower Extremity Etiology: Wound Location: Left, Plantar Metatarsal head first Wound Open Wounding Event: Gradually Appeared Status: Date Acquired: 08/23/2020 Comorbid Glaucoma, Sleep Apnea, Hypertension, Peripheral Arterial Disease, Weeks Of Treatment: 37 History: Peripheral Venous Disease, Type II Diabetes, Gout, Osteoarthritis, Clustered Wound: No Neuropathy Photos Wound Measurements Length: (cm) 0.2 Width: (cm) 0.7 Depth: (cm) 0.1 Area: (cm)  0.11 Volume: (cm) 0.011 % Reduction in Area: 99.2% % Reduction in Volume: 99.6% Epithelialization: Medium (34-66%) Tunneling: No Undermining: No Wound Description Classification: Grade 2 Wound Margin: Flat and Intact Exudate Amount: Small Exudate Type: Serosanguineous Exudate Color: red, brown Wound Bed Granulation Amount: Large (67-100%) Exposed Structure Granulation Quality: Red Fascia Exposed: No Necrotic Amount: None Present (0%) Fat Layer (Subcutaneous Tissue) Exposed: Yes Tendon Exposed: No Muscle Exposed: No Joint Exposed: No Bone Exposed: No Electronic Signature(s) Signed: 12/28/2021 5:06:25 PM By: Baruch Gouty RN, BSN Entered By: Baruch Gouty on 12/28/2021 10:41:48 -------------------------------------------------------------------------------- Wound Assessment Details Patient Name: Date of Service: [name]. 12/28/2021 10:15 A M Medical Record Number: [id number] Patient Account Number: [id number] Date of Birth/Sex: Treating RN: [birth year] (71 y.o. [name] Primary Care Rai Sinagra: [name] Other Clinician: Referring Averyana Pillars: Treating Krystin Keeven/Extender: [name] in Treatment: 54 Wound Status Wound Number: 26 Primary Cyst Etiology: Wound Location: Left, Lateral Lower Leg Wound Open Wounding Event: Bump Status: Date Acquired: 06/03/2021 Comorbid Glaucoma, Sleep Apnea, Hypertension, Peripheral Arterial Disease, Weeks Of Treatment: 29 History: Peripheral Venous Disease, Type II Diabetes, Gout, Osteoarthritis, Clustered Wound: No Neuropathy Photos Wound Measurements Length: (cm) 8.8 Width: (cm) 2.6 Depth: (cm) 0.1 Area: (cm) 17.97 Volume: (cm) 1.797 % Reduction in Area: -989.8% % Reduction in Volume: -36.2% Epithelialization: Small (1-33%) Tunneling: Yes Position (o'clock): 3 Maximum Distance: (cm) 0.5 Undermining: No Wound Description Classification: Full Thickness With Exposed Support  Structures Wound Margin: Flat and Intact Exudate Amount: Medium Exudate Type: Serosanguineous Exudate Color: red, brown Foul Odor After Cleansing: No Slough/Fibrino No Wound Bed Granulation Amount: Large (67-100%) Exposed Structure Granulation Quality: Red Fascia Exposed: No Necrotic Amount: None Present (0%) Fat Layer (Subcutaneous Tissue) Exposed: Yes Tendon Exposed: No Muscle Exposed: No Joint Exposed: No Bone Exposed: No Electronic Signature(s) Signed: 12/28/2021 5:06:25 PM By: Baruch Gouty RN, BSN Entered By: Baruch Gouty on 12/28/2021 10:42:20 -------------------------------------------------------------------------------- Wound Assessment Details Patient Name: Date of Service: [name]. 12/28/2021 10:15 A M Medical Record Number: [id number] Patient Account Number: [id number] Date of Birth/Sex: Treating RN: 1951/03/14 (70  y.o. [name] Primary Care Nykiah Ma: Other Clinician: [name] Referring Deveney Bayon: Treating Leeanna Slaby/Extender: [name] in Treatment: 73 Wound Status Wound Number: [id number] Primary Abrasion Etiology: Wound Location: Left, Medial Upper Leg Wound Healed - Epithelialized Wounding Event: Trauma Status: Date Acquired: 09/14/2021 Comorbid Glaucoma, Sleep Apnea, Hypertension, Peripheral Arterial Disease, Weeks Of Treatment: 14 History: Peripheral Venous Disease, Type II Diabetes, Gout, Osteoarthritis, Clustered Wound: Yes Neuropathy Photos Wound Measurements Length: (cm) Width: (cm) Depth: (cm) Area: (cm) Volume: (cm) 0 % Reduction in Area: 100% 0 % Reduction in Volume: 100% 0 Epithelialization: Large (67-100%) 0 Tunneling: No 0 Undermining: No Wound Description Classification: Full Thickness Without Exposed Support Structu Exudate Amount: None Present res Wound Bed Granulation Amount: None Present (0%) Exposed Structure Necrotic Amount: None Present (0%) Fascia Exposed: No Fat Layer  (Subcutaneous Tissue) Exposed: No Tendon Exposed: No Muscle Exposed: No Joint Exposed: No Bone Exposed: No Electronic Signature(s) Signed: 12/28/2021 5:06:25 PM By: Baruch Gouty RN, BSN Entered By: Baruch Gouty on 12/28/2021 10:41:16 -------------------------------------------------------------------------------- [place] Details Patient Name: Date of Service: [name]. 12/28/2021 10:15 A M Medical Record Number: [id number] Patient Account Number: [id number] Date of Birth/Sex: Treating RN: July 29, 1950 (71 y.o. [name] Primary Care Dreon Pineda: [name] Other Clinician: Referring Elisabel Hanover: Treating Pearlie Lafosse/Extender: [name] in Treatment: 37 Vital Signs Time Taken: 10:28 Temperature (F): 98.1 Height (in): 74 Pulse (bpm): 69 Weight (lbs): 238 Respiratory Rate (breaths/min): 18 Body Mass Index (BMI): 30.6 Blood Pressure (mmHg): 134/80 Capillary Blood Glucose (mg/dl): 182 Reference Range: 80 - 120 mg / dl Notes glucose per pt report this am Electronic Signature(s) Signed: 12/28/2021 5:06:25 PM By: Baruch Gouty RN, BSN Entered By: Baruch Gouty on 12/28/2021 10:29:04

## 2021-12-28 NOTE — Progress Notes (Addendum)
BERN, FARE ([id number]) Visit Report for 12/28/2021 Chief Complaint Document Details Patient Name: Date of Service: JAHKAI, YANDELL 12/28/2021 10:15 A M Medical Record Number: [id number] Patient Account Number: [id number] Date of Birth/Sex: Treating RN: 05/22/1951 (71 y.o. [name] Primary Care Provider: [name] Other Clinician: Referring Provider: Treating Provider/Extender: [name] in Treatment: 37 Information Obtained from: Patient Chief Complaint Left leg and foot ulcers 04/12/2021; patient is here for wounds on his left lower leg and left plantar foot over the first metatarsal head Electronic Signature(s) Signed: 12/28/2021 11:02:47 AM By: Fredirick Maudlin MD FACS Entered By: Fredirick Maudlin on 12/28/2021 11:02:47 -------------------------------------------------------------------------------- HPI Details Patient Name: Date of Service: [name] 12/28/2021 10:15 A M Medical Record Number: [id number] Patient Account Number: [id number] Date of Birth/Sex: Treating RN: June 01, 1951 (71 y.o. [name] Primary Care Provider: [name] Other Clinician: Referring Provider: Treating Provider/Extender: [name] in Treatment: 71 History of Present Illness HPI Description: 10/11/17; Mr. Laraia is a 71 year old man who tells me that in 2015 he slipped down the latter traumatizing his left leg. He developed a wound in the same spot the area that we are currently looking at. He states this closed over for the most part although he always felt it was somewhat unstable. In 2016 he hit the same area with the door of his car had this reopened. He tells me that this is never really closed although sometimes an inflow it remains open on a constant basis. He has not been using any specific dressing to this except for topical antibiotics the nature of which were not really sure. His primary doctor did send him to see  Dr. [name] of interventional cardiology. He underwent an angiogram on 08/06/17 and he underwent a PTA and directional atherectomy of the lesser distal SFA and popliteal arteries which resulted in brisk improvement in blood flow. It was noted that he had 2 vessel runoff through the anterior tibial and peroneal. He is also been to see vascular and interventional radiologist. He was not felt to have any significant superficial venous insufficiency. Presumably is not a candidate for any ablation. It was suggested he come here for wound care. The patient is a type II diabetic on insulin. He also has a history of venous insufficiency. ABIs on the left were noncompressible in our clinic 10/21/17; patient we admitted to the clinic last week. 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 We put Iodosorb on him after an aggressive debridement and 3 layer compression. He complained of pain in his ankle and itching with is skin in fact he scratched the area on the medial calf superiorly at the rim of our wraps and he has 2 small open areas in that location today which are new. I changed his primary dressing today to silver collagen. As noted he is already had revascularization and does not have any significant superficial venous insufficiency that would be amenable to ablation 10/28/17; patient admitted to the clinic 2 weeks ago. He has a smaller Wound. Scratch injury from last week revealed. There is large wound over the tibial area. This is smaller. Granulation looks healthy. No need for debridement. 11/04/17; the wound on the left lateral calf looks better. Improved dimensions. Surface of this looks better. We've been maintaining him and Kerlix Coban wraps. He finds this much more comfortable. Silver collagen dressing 11/11/17; left lateral Wound continues  to look healthy be making progress. Using a #5 curet I removed removed nonviable skin from the  surface of the wound and then necrotic debris from the wound surface. Surface of the wound continues to look healthy. He also has an open area on the left great toenail bed. We've been using topical antibiotics. 11/19/17; left anterior lateral wound continues to look healthy but it's not closed. He also had a small wound above this on the left leg Initially traumatic wounds in the setting of significant chronic venous insufficiency and stasis dermatitis 11/25/17; left anterior wounds superiorly is closed still a small wound inferiorly. 12/02/17; left anterior tibial area. Arrives today with adherent callus. Post debridement clearly not completely closed. Hydrofera Blue under 3 layer compression. 12/09/17; left anterior tibia. Circumferential eschar however the wound bed looks stable to improved. We've been using Hydrofera Blue under 3 layer compression 12/17/17; left anterior tibia. Apparently this was felt to be closed however when the wrap was taken off there is a skin tear to reopen wounds in the same area we've been using Hydrofera Blue under 3 layer compression 12/23/17 left anterior tibia. Not close to close this week apparently the [facility] was stuck to this again. Still circumferential eschar requiring debridement. I put a contact layer on this this time under the Hydrofera Blue 12/31/17; left anterior tibia. Wound is better slight amount of hyper-granulation. Using Hydrofera Blue over Adaptic. 01/07/18; left anterior tibia. The wound had some surface eschar however after this was removed he has no open wound.he was already revascularized by Dr. [name] when he came to our clinic with atherectomy of the left SFA and popliteal artery. He was also sent to interventional radiology for venous reflux studies. He was not felt to have significant reflux but certainly has chronic venous changes of his skin with hemosiderin deposition around this area. He will definitely need to lubricate his skin and wear  compression stocking and I've talked to him about this. READMISSION 05/26/2018 This is a now 71 year old man we cared for with traumatic wounds on his left anterior lower extremity. He had been previously revascularized during that admission by Dr. [name]. Apparently in follow-up Dr. [name] noted that he had deterioration in his arterial status. He underwent a stent placement in the distal left SFA on 04/22/2018. 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 He went back to the angiography suite on 04/30/2018 he underwent PTA and balloon angioplasty of the occluded left mid anterior tibial artery, thrombotic occlusion went from 100 to 0% which reconstitutes the posterior tibial artery. He had thrombectomy and aspiration of the peroneal artery. The stent placed in the distal SFA left SFA was still occluded. 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 He has been recuperating at [facility] after the most recent hospitalization. He is going home tomorrow. He tells me that 2 weeks ago he traumatized the tip of his left fifth toe. He came in urgently for our review of this. This was a history of before I noted that Dr. [name] had already noted dry gangrenous changes of the left fifth toe 06/09/2018; 2-week follow-up. I did contact Dr. [name] after his last appointment and he apparently saw 1 of Dr. Irven Shelling colleagues  the next day. He does not follow-up with Dr. [name] himself until Thursday of this week. 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 Nevertheless there is no evidence of infection no drainage and no pain. He had a new area that this week when we were signing him in today on the left anterior mid tibia  area, this is in close proximity to the previous wound we have dealt with in this clinic. 06/23/2018; 2-week follow-up. I did not receive a recent note from Dr. [name] to review today. Our office is trying to obtain this. He is apparently not planning to do further vascular interventions and wondered about compression to try and help with the patient's chronic venous insufficiency. However we are also concerned about the arterial flow. He arrives in clinic today with a new area on the left third toe. The areas on the calf/anterior tibia are close to closing. The left fifth toe is still mummified using Betadine. -In reviewing things with the patient he has what sounds like claudication with mild to moderate amount of activity. 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 I prescribed Levaquin over the phone while we attempted to arrange a plan of care. However the patient called yesterday to report he had low-grade fever and he came in today acutely. There is been a marked deterioration in the left third toe with spreading cellulitis up into the dorsal left foot. He was referred to the emergency room. Readmission: 06/29/2020 patient presents today for reevaluation here in our clinic he was previously treated by Dr. Dellia Nims at the latter part of 2019 in 2 the beginning of 2020. Subsequently we have not seen him since that time in the interim he did have evaluation with vein and vascular specialist specifically Dr. Anice Paganini who did perform quite extensive work for a left femoral to anterior tibial artery bypass. With that being said in the interim the patient has developed significant lymphedema and has wounds that he tells me have really never healed in regard to the incision site on the left leg. He also has multiple wounds on the feet for various reasons some of which is that he tends to pick at his feet. Fortunately there is no signs of active infection systemically at  this time he does have some wounds that are little bit deeper but most are fairly superficial he seems to have good blood flow and overall everything appears to be healthy I see no bone exposed and no obvious signs of osteomyelitis. I do not know that he necessarily needs a x-ray at this point although that something we could consider depending on how things progress. The patient does have a history of lymphedema, diabetes, this is type II, chronic kidney disease stage III, hypertension, and history of peripheral vascular disease. 07/05/2020; patient admitted last week. Is a patient I remember from 2019 he had a spreading infection involving the left foot and we sent him to the hospital. 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 He subsequently had a left femoral to anterior tibial bypass by Dr.Cain vein and vascular. He also has severe lymphedema with chronic skin changes related to that on the left leg. The most problematic area that was new today was on the left medial great toe. This was apparently a small area last week there was purulent drainage which our intake nurse cultured. Also areas on the  left medial foot and heel left lateral foot. He has 2 areas on the left medial calf left lateral calf in the setting of the severe lymphedema. 07/13/2020 on evaluation today patient appears to be doing better in my opinion compared to his last visit. The good news is there is no signs of active infection systemically and locally I do not see any signs of infection either. He did have an x-ray which was negative that is great news he had a culture which showed MRSA but at the same time he is been on the doxycycline which has helped. I do think we may want to extend this for 7 additional days 1/25; patient admitted to the clinic a few weeks ago. 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 We have been putting him under  compression his edema control is a lot better but he is severe verricused skin on the left leg. He is really done quite well he still has an open area on the left medial calf and the left medial first metatarsal head. We have been using silver collagen on the leg silver alginate on the foot 07/27/2020 upon evaluation today patient appears to be doing decently well in regard to his wounds. He still has a lot of dry skin on the left leg. Some of this is starting to peel back and I think he may be able to have them out by removing some that today. Fortunately there is no signs of active infection at this time on the left leg although on the right leg he does appear to have swelling and erythema as well as some mild warmth to touch. This does have been concerned about the possibility of cellulitis although within the differential diagnosis I do think that potentially a DVT has to be at least considered. We need to rule that out before proceeding would just call in the cellulitis. Especially since he is having pain in the posterior aspect of his calf muscle. 2/8; the patient had seen sparingly. He has severe skin changes of chronic lymphedema in the left leg thickened hyperkeratotic verrucous skin. He has an open wound on the medial part of the left first met head left mid tibia. He also has a rim of nonepithelialized skin in the anterior mid tibia. He brought in the AmLactin lotion that was been prescribed although I am not sure under compression and its utility. There concern about cellulitis on the right lower leg the last time he was here. He was put on on antibiotics. His DVT rule out was negative. The right leg looks fine he is using his stocking on this area 08/10/2020 upon evaluation today patient appears to be doing well with regard to his leg currently. He has been tolerating the dressing changes without complication. Fortunately there is no signs of active infection which is great news. Overall very  pleased with where things stand. 2/22; the patient still has an area on the medial part of the left first met his head. This looks better than when I last saw this earlier this month he has a rim of epithelialization but still some surface debris. Mostly everything on the left leg is healed. There is still a vulnerable in the left mid tibia area. 08/30/2020 upon evaluation today patient appears to be doing much better in regard to his wounds on his foot. Fortunately there does not appear to be any signs of active infection systemically though locally we did culture  this last week and it does appear that he does have MRSA currently. Nonetheless I think we will address that today I Minna send in a prescription for him in that regard. Overall though there does not appear to be any signs of significant worsening. 09/07/2020 on evaluation today patient's wounds over his left foot appear to be doing excellent. I do not see any signs of infection there is some callus buildup this can require debridement for certain but overall I feel like he is managing quite nicely. He still using the AmLactin cream which has been beneficial for him as well. 3/22; left foot wound is closed. There is no open area here. He is using ammonium lactate lotion to the lower extremities to help exfoliate dry cracked skin. He has compression stockings from elastic therapy in [place]. The wound on the medial part of his left first met head is healed today. READMISSION 04/12/2021 Mr. Rahrig is a patient we know fairly well he had a prolonged stay in clinic in 2019 with wounds on his left lateral and left anterior lower extremity in the setting of chronic venous insufficiency. More recently he was here earlier this year with predominantly an area on his left foot first metatarsal head plantar and he says the plantar foot broke down on its not long after we discharged him but he did not come back here. The last few months areas of broken  down on his left anterior and again the left lateral lower extremity. The leg itself is very swollen chronically enlarged a lot of hyperkeratotic dry Berry Q skin in the left lower leg. His edema extends well into the thigh. He was seen by Dr. Donzetta Matters. He had ABIs on 03/02/2021 showing an ABI on the right of 1 with a TBI of 0.72 his ABI in the left at 1.09 TBI of 0.99. Monophasic and biphasic waveforms on the right. On the left monophasic waveforms were noted he went on to have an angiogram on 03/27/2021 this showed the aortic aortic and iliac segments were free of flow-limiting stenosis the left common femoral vein to evaluate the left femoral to anterior tibial artery bypass was unobstructed the bypass was patent without any areas of stenosis. We discharged the patient in bilateral juxta lite stockings but very clearly that was not sufficient to control the swelling and maintain skin integrity. He is clearly going to need compression pumps. 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 This is fortunate because he is on his feet for long periods of time. 10/27; patient comes in with our intake nurse reporting copious amount of green drainage from the left anterior mid tibia the left dorsal foot and to a lesser extent the left medial mid tibia. We left the compression wrap on all week for the amount of edema in his left leg is quite a bit better. We use silver alginate as the primary dressing 11/3; edema control is good. Left anterior lower leg left medial lower leg and the plantar first metatarsal head. The left anterior lower leg required debridement. Deep tissue culture I did of this wound showed MRSA I put him on 10 days of doxycycline which she will start today. We have him in compression wraps. He has a security card and [employer] however he is retiring on November 15. We will need to then get him into a better offloading boot for the left foot perhaps a  total contact cast 11/10; edema control is quite good. Left anterior and left medial lower leg wounds in the setting of chronic venous insufficiency and lymphedema. He also has a substantial area over the left plantar first metatarsal head. 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 He has significant hypergranulation on the left plantar foot wound. The patient is a diabetic but he does not have significant PAD 11/17; edema control is quite good. Left anterior and left medial lower leg wounds look better. The really concerning area remains the area on the left plantar first metatarsal head. He has a rim of epithelialization. 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 Starting today with a forefoot off loader but . possibly a total contact cast. 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 Arrives with a very significant odor from I think all of the wounds on the left leg and the left foot. Because of this I did not put a total contact cast on him today but will could still consider this. His wife was having cataract surgery which is the reason he missed the appointment 12/6. I saw this man 5 days ago with a swelling below the popliteal fossa. I thought he actually might have a Baker's cyst however the DVT rule out study that we could arrange right away was negative the technician told me this was not a ruptured Baker's cyst. We attempted to get this aspirated by under ultrasound guidance in interventional radiology however all they did was an ultrasound however it shows an extensive fluid collection 62 x 8 x 9.4 in the left thigh and left calf. The patient states he thinks this started 8 days  ago or so but he really is not complaining of any pain, fever or systemic symptoms. He has not ha 12/20; after some difficulty I managed to get the patient into see Dr. Donzetta Matters. Eventually he was taken into the hospital and had a drain put in the fluid collection below his left knee posteriorly extending into the posterior thigh. He still has the drain in place. Culture of this showed moderate staff aureus few Morganella and few Klebsiella he is now on doxycycline and ciprofloxacin as suggested by infectious disease he is on this for a month. The drain will remain in place until it stops draining 12/29; he comes in today with the 1 wound on his left leg and the area on the left plantar first met head significantly smaller. Both look healthy. He still has the drain in the left leg. He says he has to change this daily. Follows up with Dr. Donzetta Matters on January 11. 06/29/2021; the wounds that I am following on the left leg and left first met head continued to be quite healthy. However the area where his inferior drain is in place had copious amounts of drainage which was green in color. The wound here is larger. Follows up with Dr. Gwenlyn Saran of vein and vascular his surgeon next week as well as infectious disease. He remains on ciprofloxacin and doxycycline. He is not complaining of excessive pain in either one of the drain areas 1/12; the patient saw vascular surgery and  infectious disease. Vascular surgery has left the drain in place as there was still some notable drainage still see him back in 2 weeks. Dr. Velna Ochs stop the doxycycline and ciprofloxacin and I do not believe he follows up with them at this point. Culture I did last week showed both doxycycline resistant MRSA and Pseudomonas not sensitive to ciprofloxacin although only in rare titers 1/19; the patient's wound on the left anterior lower leg is just about healed. We have continued healing of the area that was medially on the left leg. Left first plantar  metatarsal head continues to get smaller. The major problem here is his 2 drain sites 1 on the left upper calf and lateral thigh. There is purulent drainage still from the left lateral thigh. I gave him antibiotics last week but we still have recultured. He has the drain in the area I think this is eventually going to have to come out. I suspect there will be a connecting wound to heal here perhaps with improved VAc 1/26; the patient had his drain removed by vein and vascular on 1/25/. This was a large pocket of fluid in his left thigh that seem to tunnel into his left upper calf. 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 His mention his Penrose drain was removed today. He now has a tunneling wound on his left calf and left thigh. Both of these probe widely towards each other although I cannot really prove that they connect. Both wounds on his lower leg anteriorly are closed and his area over the first metatarsal head on his right foot continues to improve. We are using Hydrofera Blue here. He also saw infectious disease culture of the abscess they noted was polymicrobial with MRSA, Morganella and Klebsiella he was treated with doxycycline and ciprofloxacin for 4 weeks ending on 07/03/2021. They did not recommend any further antibiotics. Notable that while he still had the Penrose drain in place last week he had purulent drainage coming out of the inferior IandD site this grew [facility], MRSA and Pseudomonas but there does not appear to be any active infection in this area today with the drain out and he is not systemically unwell 2/2; with regards to the drain sites the superior one on the thigh actually is closed down the one on the upper left lateral calf measures about 8 and half centimeters which is an improvement seems to be less prominent although still with a lot of drainage. The only remaining wound is over the first metatarsal head on the left foot and this looks to be  continuing to improve with Hydrofera Blue. 2/9; the area on his plantar left foot continues to contract. Callus around the wound edge. The drain sites specifically have not come down in depth. We put the wound VAC on Monday he changed the canister late last night our intake nurse reported a pocket of fluid perhaps caused by our compression wraps 2/16; continued improvement in left foot plantar wound. drainage site in the calf is not improved in terms of depth (wound vac) 2/23; continued improvement in the left foot wound over the first metatarsal head. With regards to the drain sites the area on his thigh laterally is healed however the open area on his calf is small in terms of circumference by still probes in by about 15 cm. Within using the wound VAC. Hydrofera Blue on his foot 08/24/2021: The left first metatarsal head wound continues to improve. The wound bed is  healthy with just some surrounding callus. Unfortunately the open drain site on his calf remains open and tunnels at least 15 cm (the extent of a Q-tip). This is despite several weeks of wound VAC treatment. Based on reading back through the notes, there has been really no significant change in the depth of the wound, although the orifice is smaller and the more cranial wound on his thigh has closed. I suspect the tunnel tracks nearly all the way to this location. 08/31/2021: Continued improvement in the left first metatarsal head wound. There has been absolutely no improvement to the long tunnel from his open drain site on his calf. We have tried to get him into see vascular surgery sooner to consider the possibility of simply filleting the tract open and allowing it to heal from the bottom up, likely with a wound VAC. They have not yet scheduled a sooner appointment than his current mid April 09/14/2021: He was seen by vascular surgery and they took him to the operating room last week. They opened a portion of the tunnel, but did not extend  the entire length of the known open subcutaneous tract. I read Dr. Claretha Cooper operative note and it is not clear from that documentation why only a portion of the tract was opened. The heaped up granulation tissue was curetted and removed from at least some portion of the tract. They did place a wound VAC and applied an Unna boot to the leg. The ulcer on his left first metatarsal head is smaller today. The bed looks good and there is just a small amount of surrounding callus. 09/21/2021: The ulcer on his left first metatarsal head looks to be stalled. There is some callus surrounding the wound but the wound bed itself does not appear particularly dynamic. The tunnel tract on his lateral left leg seems to be roughly the same length or perhaps slightly smaller but the wound bed appears healthy with good granulation tissue. He opened up a new wound on his medial thigh and the site of a prior surgical incision. He says that he did this unconsciously in his sleep by scratching. 09/28/2021: Unfortunately, the ulcer on his left first metatarsal head has extended underneath the callus toward the dorsum of his foot. The medial thigh wounds are roughly the same. The tunnel on his lateral left leg continues to be problematic; it is longer than we are able to actually probe with a Q-tip. I am still not certain as to why Dr. Donzetta Matters did not open this up entirely when he took the patient to the operating room. We will likely be back in the same situation with just a small superficial opening in a long unhealed tract, as the open portion is granulating in nicely. 10/02/2021: The patient was initially scheduled for a nurse visit, but we are also applying a total contact cast today. The plantar foot wound looks clean without significant accumulated callus. We have been applying Prisma silver collagen to the site. 10/05/2021: The patient is here for his first total contact cast change. We have tried using gauze packing strips in  the tunnel on his lateral leg wound, but this does not seem to be working any better than the white VAC foam. The foot ulcer looks about the same with minimal periwound callus. Medial thigh wound is clean with just some overlying eschar. 10/12/2021: The plantar foot wound is stable without any significant accumulation of periwound callus. The surface is viable with good granulation tissue. The medial thigh  wounds are much smaller and are epithelializing. On the other hand, he had purulent drainage coming from the tunnel on his lateral leg. He does go back to see Dr. Donzetta Matters next week and is planning to ask him why the wound tunnel was not completely opened at the time of his most recent operation. 10/19/2021: The plantar foot wound is markedly improved and has epithelial tissue coming through the surface. The medial thigh wounds are nearly closed with just a tiny open area. He did see Dr. Donzetta Matters earlier this week and apparently they did discuss the possibility of opening the sinus tract further and enabling a wound VAC application. Apparently there are some limits as to what Dr. 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 I think if we could get the tract open to the level of the popliteal fossa, this would greatly aid in her ability to get this chart closed. That being said, however, today when I probed the tract with a Q-tip, I was not able to insert the entirety of the Q-tip as I have on previous occasions. The tunnel is shorter by about 4 cm. The surface is clean with good granulation tissue and no further episodes of purulent drainage. 10/30/2021: Last week, the patient underwent surgery and had the long tract in his leg opened. There was a rind that was debrided, according to the operative report. His medial thigh ulcers are closed. The plantar foot wound is clean with a good surface and some built up surrounding callus. 11/06/2021: The overall dimensions of the large  wound on his lateral leg remain about the same, but there is good granulation tissue present and the tunneling is a little bit shorter. He has a new wound on his anterior tibial surface, in the same location where he had a similar lesion in the past. The plantar foot wound is clean with some buildup surrounding callus. Just toward the medial aspect of his foot, however, there is an area of darkening that once debrided, revealed another opening in the skin surface. 11/13/2021: The anterior tibial surface wound is closed. The plantar foot wound has some surrounding callus buildup. The area of darkening that I debrided last week and revealed an opening in the skin surface has closed again. The tunnel in the large wound on his lateral leg has come in by about 3 cm. There is healthy granulation tissue on the entire wound surface. 11/23/2021: The patient was out of town last week and did wet-to-dry dressings on his large wound. 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 Unfortunately, he picked open the wound on his left medial thigh. He says that it was itching and he just could not stop scratching it until it was open again. The wound on his plantar foot is smaller and has not accumulated a tremendous amount of callus. The lateral leg wound is shallower and the tunnel has also decreased in depth. There is just a little bit of slough accumulation on the surface. 11/30/2021: Another portion of his left medial thigh has been opened up. All of these wounds are fairly superficial with just a little bit of slough and eschar accumulation. The wound on his plantar foot is almost closed with just a bit of eschar and periwound callus accumulation. The lateral leg wound is nearly flush with the surrounding skin and the tunnel is markedly shallower. 12/07/2021: There is just 1 open area on his  left medial thigh. It is clean with just a little bit of  perimeter eschar. The wound on his plantar foot continues to contract and just has some eschar and periwound callus accumulation. The lateral leg wound is closing at the more distal aspect and the tunnel is smaller. The surface is nearly flush with the surrounding skin and it has a good bed of granulation tissue. 12/14/2021: The thigh and foot wounds are closed. The lateral leg wound has closed over approximately half of its length. The tunnel continues to contract and the surface is now flush with the surrounding skin. The wound bed has robust granulation tissue. 12/22/2021: The thigh and foot wounds have reopened. The foot wound has a lot of callus accumulation around and over it. The thigh wound is tiny with just a little bit of slough in the wound bed. The lateral leg wound continues to contract. His vascular surgeon took the wound VAC off earlier in the week and the patient has been doing wet-to-dry dressings. There is a little slough accumulation on the surface. The tunnel is about 3 cm in depth at this point. 12/28/2021: The thigh wound is closed again. The foot wound has some callus that subsequently has peeled back exposing just a small slit of a wound. The lateral leg wound Is down to about half the size that it originally was and the tunnel is down to about half a centimeter in depth. Electronic Signature(s) Signed: 12/28/2021 11:07:22 AM By: Fredirick Maudlin MD FACS Previous Signature: 12/28/2021 11:04:39 AM Version By: Fredirick Maudlin MD FACS Entered By: Fredirick Maudlin on 12/28/2021 11:07:22 -------------------------------------------------------------------------------- Physical Exam Details Patient Name: Date of Service: [name] 12/28/2021 10:15 A M Medical Record Number: [id number] Patient Account Number: [id number] Date of Birth/Sex: Treating RN: 1951/01/20 (71 y.o. [name] Primary Care Provider: [name] Other Clinician: Referring Provider: Treating  Provider/Extender: [name] in Treatment: 37 Constitutional . . . . No acute distress.Marland Kitchen Respiratory Normal work of breathing on room air.. Notes 12/28/2021: The thigh wound is closed again. The foot wound has some callus that subsequently has peeled back exposing just a small slit of a wound. The lateral leg wound is down to about half the size that it originally was and the tunnel is down to about half a centimeter in depth. Electronic Signature(s) Signed: 12/28/2021 11:14:31 AM By: Fredirick Maudlin MD FACS Previous Signature: 12/28/2021 11:07:42 AM Version By: Fredirick Maudlin MD FACS Entered By: Fredirick Maudlin on 12/28/2021 11:14:31 -------------------------------------------------------------------------------- Physician Orders Details Patient Name: Date of Service: [name] 12/28/2021 10:15 A M Medical Record Number: [id number] Patient Account Number: [id number] Date of Birth/Sex: Treating RN: 1951/04/26 (71 y.o. [name] Primary Care Provider: [name] Other Clinician: Referring Provider: Treating Provider/Extender: [name] in Treatment: [phone] Verbal / Phone Orders: No Diagnosis Coding ICD-10 Coding Code Description E11.621 Type 2 diabetes mellitus with foot ulcer 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 I89.0 Lymphedema, not elsewhere classified I87.322 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verity 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 - Dr Celine Ahr - Room 1 Return A Thursday 7/13 @ 12:15 am Nurse Visit: - Monday 7/10 at 11:30 AM Bathing/ Shower/  Hygiene May shower with protection but do not get wound dressing(s) wet. - Use a cast protector so you can shower without getting your wrap(s) wet Negative Presssure Wound Therapy Wound #22 Left,Lateral Lower Leg Wound Vac to wound continuously at 183m/hg pressure - change 2 times per week in clinic Black Foam Edema Control - Lymphedema / SCD / Other Elevate legs to the level of the heart or above for 30 minutes daily and/or when sitting, a frequency of: - throughout the day Avoid standing for long periods of time. Patient to wear own compression stockings every day. - on right leg; Moisturize legs daily. - Ammonium lactate to right leg daily Wound Treatment Wound #18 - Metatarsal head first Wound Laterality: Plantar, Left Cleanser: Soap and Water 1 x Per Week/30 Days Discharge Instructions: May shower and wash wound with dial antibacterial soap and water prior to dressing change. Cleanser: Wound Cleanser 1 x Per Week/30 Days Discharge Instructions: Cleanse the wound with wound cleanser prior to applying a clean dressing using gauze sponges, not tissue or cotton balls. Peri-Wound Care: Triamcinolone 15 (g) 1 x Per Week/30 Days Discharge Instructions: Use triamcinolone 15 (g) as directed Peri-Wound Care: Sween Lotion (Moisturizing lotion) 1 x Per Week/30 Days Discharge Instructions: Apply moisturizing lotion as directed Prim Dressing: Promogran Prisma Matrix, 4.34 (sq in) (silver collagen) 1 x Per Week/30 Days ary Discharge Instructions: Moisten collagen with saline or hydrogel Secondary Dressing: Optifoam Non-Adhesive Dressing, 4x4 in 1 x Per Week/30 Days Discharge Instructions: Apply over primary dressing cut to make foam donut Secondary Dressing: Woven Gauze Sponge, Non-Sterile 4x4 in 1 x Per Week/30 Days Discharge Instructions: Apply over primary dressing as directed. Secured With: 7M Medipore H Soft Cloth Surgical T ape, 4 x 10 (in/yd) 1 x Per Week/30 Days Discharge Instructions:  Secure with tape as directed. Wound #22 - Lower Leg Wound Laterality: Left, Lateral Cleanser: Soap and Water 1 x Per Week/30 Days Discharge Instructions: May shower and wash wound with dial antibacterial soap and water prior to dressing change. Cleanser: Wound Cleanser 1 x Per Week/30 Days Discharge Instructions: Cleanse the wound with wound cleanser prior to applying a clean dressing using gauze sponges, not tissue or cotton balls. Peri-Wound Care: Triamcinolone 15 (g) 1 x Per Week/30 Days Discharge Instructions: Use triamcinolone 15 (g) as directed Peri-Wound Care: Sween Lotion (Moisturizing lotion) 1 x Per Week/30 Days Discharge Instructions: Apply moisturizing lotion as directed Prim Dressing: VAC ary 1 x Per Week/30 Days Compression Wrap: CoFlex TLC XL 2-layer Compression System 4x7 (in/yd) 1 x Per Week/30 Days Discharge Instructions: Apply CoFlex 2-layer compression as directed. (alt for 4 layer) Electronic Signature(s) Signed: 12/28/2021 12:58:53 PM By: CFredirick MaudlinMD FACS Entered By: CFredirick Maudlinon 12/28/2021 11:14:54 -------------------------------------------------------------------------------- Problem List Details Patient Name: Date of Service: LLucillie Garfinkel 12/28/2021 10:15 A M Medical Record Number: [id number] Account Number: [id number] of Birth/Sex: Treating RN: [birth year](71y.o. MUlyses Amor LVaughan BastaPrimary Care Provider: MJilda PandaOther Clinician: Referring Provider: Treating Provider/Extender: CBonnielee Haffin Treatment: 37 Active Problems ICD-10 Encounter Code Description Active Date MDM Diagnosis E11.621 Type 2 diabetes mellitus with foot ulcer 04/12/2021 No Yes 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 04/12/2021 No Yes I89.0 Lymphedema, not elsewhere classified 04/12/2021 No Yes I87.322 Chronic venous hypertension (idiopathic) with inflammation of left lower 04/12/2021  No Yes extremity 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 ulcer of other part of left lower leg with other specified 04/12/2021 No Yes severity L97.528  Non-pressure chronic ulcer of other part of left foot with other specified 04/12/2021 No Yes severity 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 07/20/2021 No Yes Inactive Problems ICD-10 Code Description Active Date Inactive Date E11.42 Type 2 diabetes mellitus with diabetic polyneuropathy 04/12/2021 04/12/2021 L02.416 Cutaneous abscess of left lower limb 06/13/2021 06/13/2021 Resolved Problems Electronic Signature(s) Signed: 12/28/2021 11:01:43 AM By: Fredirick Maudlin MD FACS Entered By: Fredirick Maudlin on 12/28/2021 11:01:43 -------------------------------------------------------------------------------- Progress Note Details Patient Name: Date of Service: [name] 12/28/2021 10:15 A M Medical Record Number: [id number] Patient Account Number: [id number] Date of Birth/Sex: Treating RN: 1950-09-12 (71 y.o. [name] Primary Care Provider: [name] Other Clinician: Referring Provider: Treating Provider/Extender: [name] in Treatment: 37 Subjective Chief Complaint Information obtained from Patient Left leg and foot ulcers 04/12/2021; patient is here for wounds on his left lower leg and left plantar foot over the first metatarsal head History of Present Illness (HPI) 10/11/17; Mr. Massenburg is a 71 year old man who tells me that in 2015 he slipped down the latter traumatizing his left leg. He developed a wound in the same spot the area that we are currently looking at. He states this closed over for the most part although he always felt it was somewhat unstable. In 2016 he hit the same area with the door of his car had this reopened. He tells me that this is never really closed although sometimes an inflow it remains open on a constant basis. He has not been using any  specific dressing to this except for topical antibiotics the nature of which were not really sure. His primary doctor did send him to see Dr. [name] of interventional cardiology. He underwent an angiogram on 08/06/17 and he underwent a PTA and directional atherectomy of the lesser distal SFA and popliteal arteries which resulted in brisk improvement in blood flow. It was noted that he had 2 vessel runoff through the anterior tibial and peroneal. He is also been to see vascular and interventional radiologist. He was not felt to have any significant superficial venous insufficiency. Presumably is not a candidate for any ablation. It was suggested he come here for wound care. The patient is a type II diabetic on insulin. He also has a history of venous insufficiency. ABIs on the left were noncompressible in our clinic 10/21/17; patient we admitted to the clinic last week. 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 We put Iodosorb on him after an aggressive debridement and 3 layer compression. He complained of pain in his ankle and itching with is skin in fact he scratched the area on the medial calf superiorly at the rim of our wraps and he has 2 small open areas in that location today which are new. I changed his primary dressing today to silver collagen. As noted he is already had revascularization and does not have any significant superficial venous insufficiency that would be amenable to ablation 10/28/17; patient admitted to the clinic 2 weeks ago. He has a smaller Wound. Scratch injury from last week revealed. There is large wound over the tibial area. This is smaller. Granulation looks healthy. No need for debridement. 11/04/17; the wound on the left lateral calf looks better. Improved dimensions. Surface of this looks better. We've been maintaining him and Kerlix Coban wraps. He finds this much more comfortable. Silver collagen  dressing 11/11/17; left lateral Wound continues to  look healthy be making progress. Using a #5 curet I removed removed nonviable skin from the surface of the wound and then necrotic debris from the wound surface. Surface of the wound continues to look healthy. ooHe also has an open area on the left great toenail bed. We've been using topical antibiotics. 11/19/17; left anterior lateral wound continues to look healthy but it's not closed. ooHe also had a small wound above this on the left leg ooInitially traumatic wounds in the setting of significant chronic venous insufficiency and stasis dermatitis 11/25/17; left anterior wounds superiorly is closed still a small wound inferiorly. 12/02/17; left anterior tibial area. Arrives today with adherent callus. Post debridement clearly not completely closed. Hydrofera Blue under 3 layer compression. 12/09/17; left anterior tibia. Circumferential eschar however the wound bed looks stable to improved. We've been using Hydrofera Blue under 3 layer compression 12/17/17; left anterior tibia. Apparently this was felt to be closed however when the wrap was taken off there is a skin tear to reopen wounds in the same area we've been using Hydrofera Blue under 3 layer compression 12/23/17 left anterior tibia. Not close to close this week apparently the [facility] was stuck to this again. Still circumferential eschar requiring debridement. I put a contact layer on this this time under the Hydrofera Blue 12/31/17; left anterior tibia. Wound is better slight amount of hyper-granulation. Using Hydrofera Blue over Adaptic. 01/07/18; left anterior tibia. The wound had some surface eschar however after this was removed he has no open wound.he was already revascularized by Dr. [name] when he came to our clinic with atherectomy of the left SFA and popliteal artery. He was also sent to interventional radiology for venous reflux studies. He was not felt to have significant reflux  but certainly has chronic venous changes of his skin with hemosiderin deposition around this area. He will definitely need to lubricate his skin and wear compression stocking and I've talked to him about this. READMISSION 05/26/2018 This is a now 71 year old man we cared for with traumatic wounds on his left anterior lower extremity. He had been previously revascularized during that admission by Dr. [name]. Apparently in follow-up Dr. [name] noted that he had deterioration in his arterial status. He underwent a stent placement in the distal left SFA on 04/22/2018. 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 He went back to the angiography suite on 04/30/2018 he underwent PTA and balloon angioplasty of the occluded left mid anterior tibial artery, thrombotic occlusion went from 100 to 0% which reconstitutes the posterior tibial artery. He had thrombectomy and aspiration of the peroneal artery. The stent placed in the distal SFA left SFA was still occluded. 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 He has been recuperating at [facility] in [facility] after the most recent hospitalization. He is going home tomorrow. He tells me that 2 weeks ago he traumatized the tip of his left fifth toe. He came in urgently for our review of this. This was a history of before I noted that Dr. [name] had already noted dry gangrenous changes of the left fifth toe 06/09/2018; 2-week follow-up. I did contact Dr. [name] after his last appointment and he apparently saw 1 of Dr. Irven Shelling colleagues the next  day. He does not follow-up with Dr. [name] himself until Thursday of this week. 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 Nevertheless  there is no evidence of infection no drainage and no pain. He had a new area that this week when we were signing him in today on the left anterior mid tibia area, this is in close proximity to the previous wound we have dealt with in this clinic. 06/23/2018; 2-week follow-up. I did not receive a recent note from Dr. [name] to review today. Our office is trying to obtain this. He is apparently not planning to do further vascular interventions and wondered about compression to try and help with the patient's chronic venous insufficiency. However we are also concerned about the arterial flow. ooHe arrives in clinic today with a new area on the left third toe. The areas on the calf/anterior tibia are close to closing. The left fifth toe is still mummified using Betadine. -In reviewing things with the patient he has what sounds like claudication with mild to moderate amount of activity. 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 I prescribed Levaquin over the phone while we attempted to arrange a plan of care. However the patient called yesterday to report he had low-grade fever and he came in today acutely. There is been a marked deterioration in the left third toe with spreading cellulitis up into the dorsal left foot. He was referred to the emergency room. Readmission: 06/29/2020 patient presents today for reevaluation here in our clinic he was previously treated by Dr. Dellia Nims at the latter part of 2019 in 2 the beginning of 2020. Subsequently we have not seen him since that time in the interim he did have evaluation with vein and vascular specialist specifically Dr. Anice Paganini who did perform quite extensive work for a left femoral to anterior tibial artery bypass. With that being said in the interim the patient has developed significant lymphedema and has wounds that he tells me have really never healed in regard to the incision site on the left leg. He also has  multiple wounds on the feet for various reasons some of which is that he tends to pick at his feet. Fortunately there is no signs of active infection systemically at this time he does have some wounds that are little bit deeper but most are fairly superficial he seems to have good blood flow and overall everything appears to be healthy I see no bone exposed and no obvious signs of osteomyelitis. I do not know that he necessarily needs a x-ray at this point although that something we could consider depending on how things progress. The patient does have a history of lymphedema, diabetes, this is type II, chronic kidney disease stage III, hypertension, and history of peripheral vascular disease. 07/05/2020; patient admitted last week. Is a patient I remember from 2019 he had a spreading infection involving the left foot and we sent him to the hospital. 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 He subsequently had a left femoral to anterior tibial bypass by Dr.[facility]. He also has severe lymphedema with chronic skin changes related to that on the left leg. The most problematic area that was new today was on the left medial great toe. This was apparently a small area last week there was purulent drainage which our intake nurse cultured. Also areas on the left medial  foot and heel left lateral foot. He has 2 areas on the left medial calf left lateral calf in the setting of the severe lymphedema. 07/13/2020 on evaluation today patient appears to be doing better in my opinion compared to his last visit. The good news is there is no signs of active infection systemically and locally I do not see any signs of infection either. He did have an x-ray which was negative that is great news he had a culture which showed MRSA but at the same time he is been on the doxycycline which has helped. I do think we may want to extend this for 7 additional days 1/25;  patient admitted to the clinic a few weeks ago. 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 We have been putting him under compression his edema control is a lot better but he is severe verricused skin on the left leg. He is really done quite well he still has an open area on the left medial calf and the left medial first metatarsal head. We have been using silver collagen on the leg silver alginate on the foot 07/27/2020 upon evaluation today patient appears to be doing decently well in regard to his wounds. He still has a lot of dry skin on the left leg. Some of this is starting to peel back and I think he may be able to have them out by removing some that today. Fortunately there is no signs of active infection at this time on the left leg although on the right leg he does appear to have swelling and erythema as well as some mild warmth to touch. This does have been concerned about the possibility of cellulitis although within the differential diagnosis I do think that potentially a DVT has to be at least considered. We need to rule that out before proceeding would just call in the cellulitis. Especially since he is having pain in the posterior aspect of his calf muscle. 2/8; the patient had seen sparingly. He has severe skin changes of chronic lymphedema in the left leg thickened hyperkeratotic verrucous skin. He has an open wound on the medial part of the left first met head left mid tibia. He also has a rim of nonepithelialized skin in the anterior mid tibia. He brought in the AmLactin lotion that was been prescribed although I am not sure under compression and its utility. There concern about cellulitis on the right lower leg the last time he was here. He was put on on antibiotics. His DVT rule out was negative. The right leg looks fine he is using his stocking on this area 08/10/2020 upon evaluation today patient appears to be doing well with regard to  his leg currently. He has been tolerating the dressing changes without complication. Fortunately there is no signs of active infection which is great news. Overall very pleased with where things stand. 2/22; the patient still has an area on the medial part of the left first met his head. This looks better than when I last saw this earlier this month he has a rim of epithelialization but still some surface debris. Mostly everything on the left leg is healed. There is still a vulnerable in the left mid tibia area. 08/30/2020 upon evaluation today patient appears to be doing much better in regard to his wounds on his foot. Fortunately there does not appear to be any signs of active infection systemically though locally we did culture this last  week and it does appear that he does have MRSA currently. Nonetheless I think we will address that today I Minna send in a prescription for him in that regard. Overall though there does not appear to be any signs of significant worsening. 09/07/2020 on evaluation today patient's wounds over his left foot appear to be doing excellent. I do not see any signs of infection there is some callus buildup this can require debridement for certain but overall I feel like he is managing quite nicely. He still using the AmLactin cream which has been beneficial for him as well. 3/22; left foot wound is closed. There is no open area here. He is using ammonium lactate lotion to the lower extremities to help exfoliate dry cracked skin. He has compression stockings from elastic therapy in [place]. The wound on the medial part of his left first met head is healed today. READMISSION 04/12/2021 Mr. Eng is a patient we know fairly well he had a prolonged stay in clinic in 2019 with wounds on his left lateral and left anterior lower extremity in the setting of chronic venous insufficiency. More recently he was here earlier this year with predominantly an area on his left foot first  metatarsal head plantar and he says the plantar foot broke down on its not long after we discharged him but he did not come back here. The last few months areas of broken down on his left anterior and again the left lateral lower extremity. The leg itself is very swollen chronically enlarged a lot of hyperkeratotic dry Berry Q skin in the left lower leg. His edema extends well into the thigh. He was seen by Dr. Donzetta Matters. He had ABIs on 03/02/2021 showing an ABI on the right of 1 with a TBI of 0.72 his ABI in the left at 1.09 TBI of 0.99. Monophasic and biphasic waveforms on the right. On the left monophasic waveforms were noted he went on to have an angiogram on 03/27/2021 this showed the aortic aortic and iliac segments were free of flow-limiting stenosis the left common femoral vein to evaluate the left femoral to anterior tibial artery bypass was unobstructed the bypass was patent without any areas of stenosis. We discharged the patient in bilateral juxta lite stockings but very clearly that was not sufficient to control the swelling and maintain skin integrity. He is clearly going to need compression pumps. 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 This is fortunate because he is on his feet for long periods of time. 10/27; patient comes in with our intake nurse reporting copious amount of green drainage from the left anterior mid tibia the left dorsal foot and to a lesser extent the left medial mid tibia. We left the compression wrap on all week for the amount of edema in his left leg is quite a bit better. We use silver alginate as the primary dressing 11/3; edema control is good. Left anterior lower leg left medial lower leg and the plantar first metatarsal head. The left anterior lower leg required debridement. Deep tissue culture I did of this wound showed MRSA I put him on 10 days of doxycycline which she will start today. We have him in  compression wraps. He has a security card and [employer] however he is retiring on November 15. We will need to then get him into a better offloading boot for the left foot perhaps a total contact  cast 11/10; edema control is quite good. Left anterior and left medial lower leg wounds in the setting of chronic venous insufficiency and lymphedema. He also has a substantial area over the left plantar first metatarsal head. 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 He has significant hypergranulation on the left plantar foot wound. The patient is a diabetic but he does not have significant PAD 11/17; edema control is quite good. Left anterior and left medial lower leg wounds look better. The really concerning area remains the area on the left plantar first metatarsal head. He has a rim of epithelialization. 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 Starting today with a forefoot off loader but . possibly a total contact cast. 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 Arrives with a very significant odor from I think all of the wounds on the left leg and the left foot. Because of this I did not put a total contact cast on him today but will could still consider this. His wife was having cataract surgery which is the reason he missed the appointment 12/6. I saw this man 5 days ago with a swelling below the popliteal fossa. I thought he actually might have a Baker's cyst however the DVT rule out study that we could arrange right away was negative the technician told me this was not a ruptured Baker's cyst. We attempted to get this aspirated by under ultrasound guidance in interventional radiology  however all they did was an ultrasound however it shows an extensive fluid collection 62 x 8 x 9.4 in the left thigh and left calf. The patient states he thinks this started 8 days ago or so but he really is not complaining of any pain, fever or systemic symptoms. He has not ha 12/20; after some difficulty I managed to get the patient into see Dr. Donzetta Matters. Eventually he was taken into the hospital and had a drain put in the fluid collection below his left knee posteriorly extending into the posterior thigh. He still has the drain in place. Culture of this showed moderate staff aureus few Morganella and few Klebsiella he is now on doxycycline and ciprofloxacin as suggested by infectious disease he is on this for a month. The drain will remain in place until it stops draining 12/29; he comes in today with the 1 wound on his left leg and the area on the left plantar first met head significantly smaller. Both look healthy. He still has the drain in the left leg. He says he has to change this daily. Follows up with Dr. Donzetta Matters on January 11. 06/29/2021; the wounds that I am following on the left leg and left first met head continued to be quite healthy. However the area where his inferior drain is in place had copious amounts of drainage which was green in color. The wound here is larger. Follows up with Dr. Gwenlyn Saran of vein and vascular his surgeon next week as well as infectious disease. He remains on ciprofloxacin and doxycycline. He is not complaining of excessive pain in either one of the drain areas 1/12; the patient saw vascular surgery and infectious disease.  Vascular surgery has left the drain in place as there was still some notable drainage still see him back in 2 weeks. Dr. Velna Ochs stop the doxycycline and ciprofloxacin and I do not believe he follows up with them at this point. Culture I did last week showed both doxycycline resistant MRSA and Pseudomonas not sensitive to ciprofloxacin although only in rare  titers 1/19; the patient's wound on the left anterior lower leg is just about healed. We have continued healing of the area that was medially on the left leg. Left first plantar metatarsal head continues to get smaller. The major problem here is his 2 drain sites 1 on the left upper calf and lateral thigh. There is purulent drainage still from the left lateral thigh. I gave him antibiotics last week but we still have recultured. He has the drain in the area I think this is eventually going to have to come out. I suspect there will be a connecting wound to heal here perhaps with improved VAc 1/26; the patient had his drain removed by vein and vascular on 1/25/. This was a large pocket of fluid in his left thigh that seem to tunnel into his left upper calf. 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 His mention his Penrose drain was removed today. He now has a tunneling wound on his left calf and left thigh. Both of these probe widely towards each other although I cannot really prove that they connect. Both wounds on his lower leg anteriorly are closed and his area over the first metatarsal head on his right foot continues to improve. We are using Hydrofera Blue here. He also saw infectious disease culture of the abscess they noted was polymicrobial with MRSA, Morganella and Klebsiella he was treated with doxycycline and ciprofloxacin for 4 weeks ending on 07/03/2021. They did not recommend any further antibiotics. Notable that while he still had the Penrose drain in place last week he had purulent drainage coming out of the inferior IandD site this grew [facility], MRSA and Pseudomonas but there does not appear to be any active infection in this area today with the drain out and he is not systemically unwell 2/2; with regards to the drain sites the superior one on the thigh actually is closed down the one on the upper left lateral calf measures about 8 and half centimeters which is  an improvement seems to be less prominent although still with a lot of drainage. The only remaining wound is over the first metatarsal head on the left foot and this looks to be continuing to improve with Hydrofera Blue. 2/9; the area on his plantar left foot continues to contract. Callus around the wound edge. The drain sites specifically have not come down in depth. We put the wound VAC on Monday he changed the canister late last night our intake nurse reported a pocket of fluid perhaps caused by our compression wraps 2/16; continued improvement in left foot plantar wound. drainage site in the calf is not improved in terms of depth (wound vac) 2/23; continued improvement in the left foot wound over the first metatarsal head. With regards to the drain sites the area on his thigh laterally is healed however the open area on his calf is small in terms of circumference by still probes in by about 15 cm. Within using the wound VAC. Hydrofera Blue on his foot 08/24/2021: The left first metatarsal head wound continues to improve. The wound bed is healthy with  just some surrounding callus. Unfortunately the open drain site on his calf remains open and tunnels at least 15 cm (the extent of a Q-tip). This is despite several weeks of wound VAC treatment. Based on reading back through the notes, there has been really no significant change in the depth of the wound, although the orifice is smaller and the more cranial wound on his thigh has closed. I suspect the tunnel tracks nearly all the way to this location. 08/31/2021: Continued improvement in the left first metatarsal head wound. There has been absolutely no improvement to the long tunnel from his open drain site on his calf. We have tried to get him into see vascular surgery sooner to consider the possibility of simply filleting the tract open and allowing it to heal from the bottom up, likely with a wound VAC. They have not yet scheduled a sooner  appointment than his current mid April 09/14/2021: He was seen by vascular surgery and they took him to the operating room last week. They opened a portion of the tunnel, but did not extend the entire length of the known open subcutaneous tract. I read Dr. Claretha Cooper operative note and it is not clear from that documentation why only a portion of the tract was opened. The heaped up granulation tissue was curetted and removed from at least some portion of the tract. They did place a wound VAC and applied an Unna boot to the leg. The ulcer on his left first metatarsal head is smaller today. The bed looks good and there is just a small amount of surrounding callus. 09/21/2021: The ulcer on his left first metatarsal head looks to be stalled. There is some callus surrounding the wound but the wound bed itself does not appear particularly dynamic. The tunnel tract on his lateral left leg seems to be roughly the same length or perhaps slightly smaller but the wound bed appears healthy with good granulation tissue. He opened up a new wound on his medial thigh and the site of a prior surgical incision. He says that he did this unconsciously in his sleep by scratching. 09/28/2021: Unfortunately, the ulcer on his left first metatarsal head has extended underneath the callus toward the dorsum of his foot. The medial thigh wounds are roughly the same. The tunnel on his lateral left leg continues to be problematic; it is longer than we are able to actually probe with a Q-tip. I am still not certain as to why Dr. Donzetta Matters did not open this up entirely when he took the patient to the operating room. We will likely be back in the same situation with just a small superficial opening in a long unhealed tract, as the open portion is granulating in nicely. 10/02/2021: The patient was initially scheduled for a nurse visit, but we are also applying a total contact cast today. The plantar foot wound looks clean without significant  accumulated callus. We have been applying Prisma silver collagen to the site. 10/05/2021: The patient is here for his first total contact cast change. We have tried using gauze packing strips in the tunnel on his lateral leg wound, but this does not seem to be working any better than the white VAC foam. The foot ulcer looks about the same with minimal periwound callus. Medial thigh wound is clean with just some overlying eschar. 10/12/2021: The plantar foot wound is stable without any significant accumulation of periwound callus. The surface is viable with good granulation tissue. The medial thigh wounds are  much smaller and are epithelializing. On the other hand, he had purulent drainage coming from the tunnel on his lateral leg. He does go back to see Dr. Donzetta Matters next week and is planning to ask him why the wound tunnel was not completely opened at the time of his most recent operation. 10/19/2021: The plantar foot wound is markedly improved and has epithelial tissue coming through the surface. The medial thigh wounds are nearly closed with just a tiny open area. He did see Dr. Donzetta Matters earlier this week and apparently they did discuss the possibility of opening the sinus tract further and enabling a wound VAC application. Apparently there are some limits as to what Dr. 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 I think if we could get the tract open to the level of the popliteal fossa, this would greatly aid in her ability to get this chart closed. That being said, however, today when I probed the tract with a Q-tip, I was not able to insert the entirety of the Q-tip as I have on previous occasions. The tunnel is shorter by about 4 cm. The surface is clean with good granulation tissue and no further episodes of purulent drainage. 10/30/2021: Last week, the patient underwent surgery and had the long tract in his leg opened. There was a rind that was debrided, according to the  operative report. His medial thigh ulcers are closed. The plantar foot wound is clean with a good surface and some built up surrounding callus. 11/06/2021: The overall dimensions of the large wound on his lateral leg remain about the same, but there is good granulation tissue present and the tunneling is a little bit shorter. He has a new wound on his anterior tibial surface, in the same location where he had a similar lesion in the past. The plantar foot wound is clean with some buildup surrounding callus. Just toward the medial aspect of his foot, however, there is an area of darkening that once debrided, revealed another opening in the skin surface. 11/13/2021: The anterior tibial surface wound is closed. The plantar foot wound has some surrounding callus buildup. The area of darkening that I debrided last week and revealed an opening in the skin surface has closed again. The tunnel in the large wound on his lateral leg has come in by about 3 cm. There is healthy granulation tissue on the entire wound surface. 11/23/2021: The patient was out of town last week and did wet-to-dry dressings on his large wound. 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 Unfortunately, he picked open the wound on his left medial thigh. He says that it was itching and he just could not stop scratching it until it was open again. The wound on his plantar foot is smaller and has not accumulated a tremendous amount of callus. The lateral leg wound is shallower and the tunnel has also decreased in depth. There is just a little bit of slough accumulation on the surface. 11/30/2021: Another portion of his left medial thigh has been opened up. All of these wounds are fairly superficial with just a little bit of slough and eschar accumulation. The wound on his plantar foot is almost closed with just a bit of eschar and periwound callus accumulation. The lateral leg  wound is nearly flush with the surrounding skin and the tunnel is markedly shallower. 12/07/2021: There is just 1 open area on his left  medial thigh. It is clean with just a little bit of perimeter eschar. The wound on his plantar foot continues to contract and just has some eschar and periwound callus accumulation. The lateral leg wound is closing at the more distal aspect and the tunnel is smaller. The surface is nearly flush with the surrounding skin and it has a good bed of granulation tissue. 12/14/2021: The thigh and foot wounds are closed. The lateral leg wound has closed over approximately half of its length. The tunnel continues to contract and the surface is now flush with the surrounding skin. The wound bed has robust granulation tissue. 12/22/2021: The thigh and foot wounds have reopened. The foot wound has a lot of callus accumulation around and over it. The thigh wound is tiny with just a little bit of slough in the wound bed. The lateral leg wound continues to contract. His vascular surgeon took the wound VAC off earlier in the week and the patient has been doing wet-to-dry dressings. There is a little slough accumulation on the surface. The tunnel is about 3 cm in depth at this point. 12/28/2021: The thigh wound is closed again. The foot wound has some callus that subsequently has peeled back exposing just a small slit of a wound. The lateral leg wound Is down to about half the size that it originally was and the tunnel is down to about half a centimeter in depth. Patient History Information obtained from Patient. Family History Diabetes - Mother, Heart Disease - Paternal Grandparents,Mother,Father,Siblings, Stroke - Father, No family history of Cancer, Hereditary Spherocytosis, Hypertension, Kidney Disease, Lung Disease, Seizures, Thyroid Problems, Tuberculosis. Social History Former smoker - quit 1999, Marital Status - Married, Alcohol Use - Moderate, Drug Use - No History, Caffeine  Use - Rarely. Medical History Eyes Patient has history of Glaucoma - both eyes Denies history of Cataracts, Optic Neuritis Ear/Nose/Mouth/Throat Denies history of Chronic sinus problems/congestion, Middle ear problems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

## 2022-01-01 ENCOUNTER — Encounter (HOSPITAL_BASED_OUTPATIENT_CLINIC_OR_DEPARTMENT_OTHER): Payer: Medicare Other | Admitting: General Surgery

## 2022-01-01 DIAGNOSIS — E1151 Type 2 diabetes mellitus with diabetic peripheral angiopathy without gangrene: Secondary | ICD-10-CM | POA: Diagnosis not present

## 2022-01-01 NOTE — Progress Notes (Signed)
AKSHAR, STARNES ([id number]) Visit Report for 01/01/2022 Arrival Information Details Patient Name: Date of Service: [name], [name] 01/01/2022 11:30 A M Medical Record Number: [id number] Patient Account Number: [id number] Date of Birth/Sex: Treating RN: 09-21-1950 (71 y.o. [name] Primary Care [name]: [name] Other Clinician: Referring Kelven Flater: Treating Sebasthian Stailey/Extender: [name] in Treatment: 62 Visit Information History Since Last Visit Added or deleted any medications: No Patient Arrived: Ambulatory Any new allergies or adverse reactions: No Arrival Time: 11:30 Had a fall or experienced change in No Accompanied By: self activities of daily living that may affect Transfer Assistance: None risk of falls: Patient Identification Verified: Yes Signs or symptoms of abuse/neglect since last visito No Secondary Verification Process Completed: Yes Hospitalized since last visit: No Patient Requires Transmission-Based Precautions: No Implantable device outside of the clinic excluding No Patient Has Alerts: Yes cellular tissue based products placed in the center since last visit: Has Dressing in Place as Prescribed: Yes Pain Present Now: No Electronic Signature(s) Signed: 01/01/2022 5:43:50 PM By: Adline Peals Entered By: Adline Peals on 01/01/2022 11:30:22 -------------------------------------------------------------------------------- Compression Therapy Details Patient Name: Date of Service: [name]. 01/01/2022 11:30 A M Medical Record Number: [id number] Patient Account Number: [id number] Date of Birth/Sex: Treating RN: [birth year] (71 y.o. [name] Primary Care Chandra Asher: [name] Other Clinician: Referring Donyae Kilner: Treating Aleksey Newbern/Extender: [name] in Treatment: 37 Compression Therapy Performed for Wound Assessment: Wound #22 Left,Lateral Lower Leg Performed By:  Clinician Adline Peals, RN Compression Type: Double Layer Electronic Signature(s) Signed: 01/01/2022 5:43:50 PM By: Sabas Sous By: Adline Peals on 01/01/2022 11:31:47 -------------------------------------------------------------------------------- Encounter Discharge Information Details Patient Name: Date of Service: [name]. 01/01/2022 11:30 A M Medical Record Number: [id number] Patient Account Number: [id number] Date of Birth/Sex: Treating RN: 07-22-1950 (71 y.o. [name] Primary Care Nel Stoneking: Other Clinician: [name] Referring Axel Frisk: Treating Shaneil Yazdi/Extender: [name] in Treatment: 37 Encounter Discharge Information Items Discharge Condition: Stable Ambulatory Status: Ambulatory Discharge Destination: Home Transportation: Private Auto Accompanied By: self Schedule Follow-up Appointment: Yes Clinical Summary of Care: Patient Declined Electronic Signature(s) Signed: 01/01/2022 5:43:50 PM By: Adline Peals Entered By: Adline Peals on 01/01/2022 11:53:05 -------------------------------------------------------------------------------- Negative Pressure Wound Therapy Maintenance (NPWT) Details Patient Name: Date of Service: [name], [name] 01/01/2022 11:30 A M Medical Record Number: [id number] Patient Account Number: [id number] Date of Birth/Sex: Treating RN: 1951-06-03 (71 y.o. [name] Primary Care Zoa Dowty: [name] Other Clinician: Referring Emmet Messer: Treating Thimothy Barretta/Extender: [name] in Treatment: 37 NPWT Maintenance Performed for: Wound #22 Left, Lateral Lower Leg Additional Injuries Covered: No Performed By: Adline Peals, RN Type: VAC System Coverage Size (sq cm): 22.88 Pressure Type: Constant Pressure Setting: 125 mmHG Drain Type: None Sponge/Dressing Type: Foam, Black Date Initiated: 07/27/2021 Dressing Removed:  Yes Quantity of Sponges/Gauze Removed: 1 Canister Changed: Yes Dressing Reapplied: Yes Quantity of Sponges/Gauze Inserted: 1 Days On NPWT : 159 Electronic Signature(s) Signed: 01/01/2022 5:43:50 PM By: Adline Peals Entered By: Adline Peals on 01/01/2022 11:31:35 -------------------------------------------------------------------------------- Patient/Caregiver Education Details Patient Name: Date of Service: [name] 7/10/2023andnbsp11:30 A M Medical Record Number: [id number] Patient Account Number: [id number] Date of Birth/Gender: Treating RN: 1950-12-18 (71 y.o. [name] Primary Care Physician: [name] Other Clinician: Referring Physician: Treating Physician/Extender: [name] in Treatment: 27 Education Assessment Education Provided To: Patient Education Topics Provided Wound/Skin Impairment: Methods: Explain/Verbal Responses: Reinforcements needed, State content correctly  Electronic Signature(s) Signed: 01/01/2022 5:43:50 PM By: Adline Peals Entered By: Adline Peals on 01/01/2022 11:52:52 -------------------------------------------------------------------------------- Wound Assessment Details Patient Name: Date of Service: [name], [name] 01/01/2022 11:30 A M Medical Record Number: [id number] Patient Account Number: [id number] Date of Birth/Sex: Treating RN: 1951-01-13 (71 y.o. [name] Primary Care Jessalyn Hinojosa: [name] Other Clinician: Referring Barnie Sopko: Treating [place] Creswell/Extender: [name] in Treatment: 37 Wound Status Wound Number: 18 Primary Diabetic Wound/Ulcer of the Lower Extremity Etiology: Wound Location: Left, Plantar Metatarsal head first Wound Open Wounding Event: Gradually Appeared Status: Date Acquired: 08/23/2020 Comorbid Glaucoma, Sleep Apnea, Hypertension, Peripheral Arterial Disease, Weeks Of Treatment: 37 History: Peripheral  Venous Disease, Type II Diabetes, Gout, Osteoarthritis, Clustered Wound: No Neuropathy Wound Measurements Length: (cm) 0.2 Width: (cm) 0.7 Depth: (cm) 0.1 Area: (cm) 0.11 Volume: (cm) 0.011 % Reduction in Area: 99.2% % Reduction in Volume: 99.6% Epithelialization: Medium (34-66%) Wound Description Classification: Grade 2 Wound Margin: Flat and Intact Exudate Amount: Small Exudate Type: Serosanguineous Exudate Color: red, brown Wound Bed Granulation Amount: Large (67-100%) Exposed Structure Granulation Quality: Red Fascia Exposed: No Necrotic Amount: None Present (0%) Fat Layer (Subcutaneous Tissue) Exposed: Yes Tendon Exposed: No Muscle Exposed: No Joint Exposed: No Bone Exposed: No Treatment Notes Wound #18 (Metatarsal head first) Wound Laterality: Plantar, Left Cleanser Soap and Water Discharge Instruction: May shower and wash wound with dial antibacterial soap and water prior to dressing change. Wound Cleanser Discharge Instruction: Cleanse the wound with wound cleanser prior to applying a clean dressing using gauze sponges, not tissue or cotton balls. Peri-Wound Care Triamcinolone 15 (g) Discharge Instruction: Use triamcinolone 15 (g) as directed Sween Lotion (Moisturizing lotion) Discharge Instruction: Apply moisturizing lotion as directed Topical Primary Dressing Promogran Prisma Matrix, 4.34 (sq in) (silver collagen) Discharge Instruction: Moisten collagen with saline or hydrogel Secondary Dressing Optifoam Non-Adhesive Dressing, 4x4 in Discharge Instruction: Apply over primary dressing cut to make foam donut Woven Gauze Sponge, Non-Sterile 4x4 in Discharge Instruction: Apply over primary dressing as directed. Secured With 3M Medipore H Soft Cloth Surgical T ape, 4 x 10 (in/yd) Discharge Instruction: Secure with tape as directed. Compression Wrap Compression Stockings Add-Ons Electronic Signature(s) Signed: 01/01/2022 5:43:50 PM By: Adline Peals Entered By: Adline Peals on 01/01/2022 11:30:45 -------------------------------------------------------------------------------- Wound Assessment Details Patient Name: Date of Service: [name], [name] 01/01/2022 11:30 A M Medical Record Number: [id number] Patient Account Number: [id number] Date of Birth/Sex: Treating RN: [birth year] (71 y.o. [name] Primary Care Emilly Lavey: [name] Other Clinician: Referring Kaiulani Sitton: Treating Marlan Steward/Extender: [name] in Treatment: 32 Wound Status Wound Number: 22 Primary Cyst Etiology: Wound Location: Left, Lateral Lower Leg Wound Open Wounding Event: Bump Status: Date Acquired: 06/03/2021 Comorbid Glaucoma, Sleep Apnea, Hypertension, Peripheral Arterial Disease, Weeks Of Treatment: 30 History: Peripheral Venous Disease, Type II Diabetes, Gout, Osteoarthritis, Clustered Wound: No Neuropathy Wound Measurements Length: (cm) 8.8 Width: (cm) 2.6 Depth: (cm) 0.1 Area: (cm) 17.97 Volume: (cm) 1.797 % Reduction in Area: -989.8% % Reduction in Volume: -36.2% Epithelialization: Small (1-33%) Wound Description Classification: Full Thickness With Exposed Support Structures Wound Margin: Flat and Intact Exudate Amount: Medium Exudate Type: Serosanguineous Exudate Color: red, brown Foul Odor After Cleansing: No Slough/Fibrino No Wound Bed Granulation Amount: Large (67-100%) Exposed Structure Granulation Quality: Red Fascia Exposed: No Necrotic Amount: None Present (0%) Fat Layer (Subcutaneous Tissue) Exposed: Yes Tendon Exposed: No Muscle Exposed: No Joint Exposed: No Bone Exposed: No Treatment Notes Wound #22 (Lower Leg) Wound Laterality: Left, Lateral Cleanser  Soap and Water Discharge Instruction: May shower and wash wound with dial antibacterial soap and water prior to dressing change. Wound Cleanser Discharge Instruction: Cleanse the wound with wound cleanser prior to  applying a clean dressing using gauze sponges, not tissue or cotton balls. Peri-Wound Care Triamcinolone 15 (g) Discharge Instruction: Use triamcinolone 15 (g) as directed Sween Lotion (Moisturizing lotion) Discharge Instruction: Apply moisturizing lotion as directed Topical Primary Dressing VAC Secondary Dressing Secured With Compression Wrap CoFlex TLC XL 2-layer Compression System 4x7 (in/yd) Discharge Instruction: Apply CoFlex 2-layer compression as directed. (alt for 4 layer) Compression Stockings Add-Ons Electronic Signature(s) Signed: 01/01/2022 5:43:50 PM By: Adline Peals Entered By: Adline Peals on 01/01/2022 11:30:51 -------------------------------------------------------------------------------- Vitals Details Patient Name: Date of Service: [name]. 01/01/2022 11:30 A M Medical Record Number: [id number] Patient Account Number: [id number] Date of Birth/Sex: Treating RN: 15-Apr-1951 (71 y.o. [name] Primary Care Clariza Sickman: [name] Other Clinician: Referring Alphus Zeck: Treating Kyrell Ruacho/Extender: [name] in Treatment: 37 Vital Signs Time Taken: 11:32 Temperature (F): 98 Height (in): 74 Pulse (bpm): 73 Weight (lbs): 238 Respiratory Rate (breaths/min): 18 Body Mass Index (BMI): 30.6 Blood Pressure (mmHg): 127/62 Reference Range: 80 - 120 mg / dl Electronic Signature(s) Signed: 01/01/2022 5:43:50 PM By: Adline Peals Entered By: Adline Peals on 01/01/2022 11:33:01

## 2022-01-01 NOTE — Progress Notes (Signed)
EAGAN, SHIFFLETT ([id number]) Visit Report for 01/01/2022 SuperBill Details Patient Name: Date of Service: [name], [name] 01/01/2022 Medical Record Number: [id number] Patient Account Number: [id number] Date of Birth/Sex: Treating RN: 26-May-1951 (71 y.o. 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 Facility Procedures CPT4 Code Description Modifier Quantity [id number] - WOUND VAC-50 SQ CM OR LESS 1 ICD-10 Diagnosis Description 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verity [id number] (Facility Use Only) [id number] - APPLY MULTLAY COMPRS LWR LT LEG 1 ICD-10 Diagnosis Description 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verity Electronic Signature(s) Signed: 01/01/2022 12:30:27 PM By: Fredirick Maudlin MD FACS Signed: 01/01/2022 5:43:50 PM By: Adline Peals Entered By: Adline Peals on 01/01/2022 11:53:32

## 2022-01-04 ENCOUNTER — Encounter (HOSPITAL_BASED_OUTPATIENT_CLINIC_OR_DEPARTMENT_OTHER): Payer: Medicare Other | Admitting: General Surgery

## 2022-01-04 DIAGNOSIS — E1151 Type 2 diabetes mellitus with diabetic peripheral angiopathy without gangrene: Secondary | ICD-10-CM | POA: Diagnosis not present

## 2022-01-04 NOTE — Progress Notes (Signed)
[name], [name] ([id number]) Visit Report for 01/04/2022 Arrival Information Details Patient Name: Date of Service: [name], [name] 01/04/2022 12:30 PM Medical Record Number: [id number] Patient Account Number: [id number] Date of Birth/Sex: Treating RN: 05-23-1951 (71 y.o. [name] Primary Care [name]: [name] Other Clinician: Referring [name]: Treating [name]/Extender: [name] in Treatment: 48 Visit Information History Since Last Visit Added or deleted any medications: No Patient Arrived: Ambulatory Any new allergies or adverse reactions: No Arrival Time: 13:09 Had a fall or experienced change in No Accompanied By: self activities of daily living that may affect Transfer Assistance: None risk of falls: Patient Identification Verified: Yes Signs or symptoms of abuse/neglect since last visito No Secondary Verification Process Completed: Yes Hospitalized since last visit: No Patient Requires Transmission-Based Precautions: No Implantable device outside of the clinic excluding No Patient Has Alerts: Yes cellular tissue based products placed in the center since last visit: Has Dressing in Place as Prescribed: Yes Has Compression in Place as Prescribed: Yes Pain Present Now: No Electronic Signature(s) Signed: 01/04/2022 5:30:43 PM By: Baruch Gouty RN, BSN Entered By: Baruch Gouty on 01/04/2022 13:10:18 -------------------------------------------------------------------------------- Compression Therapy Details Patient Name: Date of Service: [name]. 01/04/2022 12:30 PM Medical Record Number: [id number] Patient Account Number: [id number] Date of Birth/Sex: Treating RN: 1950/07/06 (71 y.o. [name] Primary Care [name]: [name] Other Clinician: Referring [name]: Treating [name]/Extender: [name] in Treatment: 38 Compression Therapy Performed for Wound Assessment: Wound #22  Left,Lateral Lower Leg Performed By: Clinician Baruch Gouty, RN Compression Type: Double Layer Post Procedure Diagnosis Same as Pre-procedure Electronic Signature(s) Signed: 01/04/2022 5:30:43 PM By: Baruch Gouty RN, BSN Entered By: Baruch Gouty on 01/04/2022 13:58:32 -------------------------------------------------------------------------------- Encounter Discharge Information Details Patient Name: Date of Service: [name]. 01/04/2022 12:30 PM Medical Record Number: [id number] Patient Account Number: [id number] Date of Birth/Sex: Treating RN: 1951-03-24 (71 y.o. [name] Primary Care [name]: [name] Other Clinician: Referring [name]: Treating [name]/Extender: [name] in Treatment: 39 Encounter Discharge Information Items Discharge Condition: Stable Ambulatory Status: Ambulatory Discharge Destination: Home Transportation: Private Auto Accompanied By: self Schedule Follow-up Appointment: Yes Clinical Summary of Care: Patient Declined Electronic Signature(s) Signed: 01/04/2022 5:30:43 PM By: Baruch Gouty RN, BSN Entered By: Baruch Gouty on 01/04/2022 13:58:54 -------------------------------------------------------------------------------- Lower Extremity Assessment Details Patient Name: Date of Service: [name]. 01/04/2022 12:30 PM Medical Record Number: [id number] Patient Account Number: [id number] Date of Birth/Sex: Treating RN: 06-14-1951 (71 y.o. [name] Primary Care [name]: [name] Other Clinician: Referring [name]: Treating [name]/Extender: [name] in Treatment: 38 Edema Assessment Assessed: [Left: No] [Right: No] Edema: [Left: Ye] [Right: s] Calf Left: Right: Point of Measurement: 41 cm From Medial Instep 42 cm Ankle Left: Right: Point of Measurement: 10 cm From Medial Instep 27 cm Vascular Assessment Pulses: Dorsalis  Pedis Palpable: [Left:Yes] Electronic Signature(s) Signed: 01/04/2022 5:30:43 PM By: Baruch Gouty RN, BSN Entered By: Baruch Gouty on 01/04/2022 13:16:54 -------------------------------------------------------------------------------- Multi Wound Chart Details Patient Name: Date of Service: [name]. 01/04/2022 12:30 PM Medical Record Number: [id number] Patient Account Number: [id number] Date of Birth/Sex: Treating RN: September 11, 1950 (71 y.o. [name] Primary Care [name]: [name] Other Clinician: Referring [name]: Treating [name]/Extender: [name] in Treatment: 38 Vital Signs Height(in): 74 Capillary Blood Glucose(mg/dl): 174 Weight(lbs): 238 Pulse(bpm): 73 Body Mass Index(BMI): 30.6 Blood Pressure(mmHg): 136/78 Temperature(F): 99 Respiratory Rate(breaths/min): 20 Photos: [N/A:N/A] Left,  Plantar Metatarsal head first Left, Lateral Lower Leg N/A Wound Location: Gradually Appeared Bump N/A Wounding Event: Diabetic Wound/Ulcer of the Lower Cyst N/A Primary Etiology: Extremity Glaucoma, Sleep Apnea, Glaucoma, Sleep Apnea, N/A Comorbid History: Hypertension, Peripheral Arterial Hypertension, Peripheral Arterial Disease, Peripheral Venous Disease, Disease, Peripheral Venous Disease, Type II Diabetes, Gout, Osteoarthritis,Type II Diabetes, Gout, Osteoarthritis, Neuropathy Neuropathy 08/23/2020 06/03/2021 N/A Date Acquired: 39 30 N/A Weeks of Treatment: Open Open N/A Wound Status: No No N/A Wound Recurrence: 0x0x0 8.8x2x0.1 N/A Measurements L x W x D (cm) 0 13.823 N/A A (cm) : rea 0 1.382 N/A Volume (cm) : 100.00% -738.30% N/A % Reduction in Area: 100.00% -4.80% N/A % Reduction in Volume: Grade 2 Full Thickness With Exposed Support N/A Classification: Structures Small Medium N/A Exudate Amount: Serosanguineous Serosanguineous N/A Exudate Type: red, brown red, brown N/A Exudate Color: Flat and Intact  Flat and Intact N/A Wound Margin: None Present (0%) Large (67-100%) N/A Granulation Amount: N/A Red N/A Granulation Quality: None Present (0%) None Present (0%) N/A Necrotic Amount: Fascia: No Fat Layer (Subcutaneous Tissue): Yes N/A Exposed Structures: Fat Layer (Subcutaneous Tissue): No Fascia: No Tendon: No Tendon: No Muscle: No Muscle: No Joint: No Joint: No Bone: No Bone: No Large (67-100%) Small (1-33%) N/A Epithelialization: Paring/cutting 1 benign hyperkeratotic Negative Pressure Wound Therapy N/A Procedures Performed: lesion Maintenance (NPWT) Treatment Notes Electronic Signature(s) Signed: 01/04/2022 1:40:59 PM By: Fredirick Maudlin MD FACS Signed: 01/04/2022 5:30:43 PM By: Baruch Gouty RN, BSN Entered By: Fredirick Maudlin on 01/04/2022 13:40:59 -------------------------------------------------------------------------------- Multi-Disciplinary Care Plan Details Patient Name: Date of Service: [name]. 01/04/2022 12:30 PM Medical Record Number: [id number] Patient Account Number: [id number] Date of Birth/Sex: Treating RN: Jul 14, 1950 (71 y.o. [name] Primary Care Ariz Terrones: [name] Other Clinician: Referring Bennetta Rudden: Treating Merlon Alcorta/Extender: [name] in Treatment: 38 Multidisciplinary Care Plan reviewed with physician Active Inactive Venous Leg Ulcer Nursing Diagnoses: Knowledge deficit related to disease process and management Potential for venous Insuffiency (use before diagnosis confirmed) Goals: Patient will maintain optimal edema control Date Initiated: 07/27/2021 Target Resolution Date: 01/17/2022 Goal Status: Active Interventions: Assess peripheral edema status every visit. Treatment Activities: Therapeutic compression applied : 07/27/2021 Notes: Wound/Skin Impairment Nursing Diagnoses: Impaired tissue integrity Knowledge deficit related to ulceration/compromised skin integrity Goals: Patient  will have a decrease in wound volume by X% from date: (specify in notes) Date Initiated: 04/12/2021 Date Inactivated: 01/04/2022 Target Resolution Date: 04/23/2021 Goal Status: Met Patient/caregiver will verbalize understanding of skin care regimen Date Initiated: 01/04/2022 Target Resolution Date: 02/01/2022 Goal Status: Active Ulcer/skin breakdown will have a volume reduction of 30% by week 4 Date Initiated: 04/12/2021 Date Inactivated: 04/27/2021 Target Resolution Date: 04/27/2021 Goal Status: Unmet Unmet Reason: infection Ulcer/skin breakdown will have a volume reduction of 50% by week 8 Date Initiated: 04/27/2021 Date Inactivated: 06/29/2021 Target Resolution Date: 06/24/2021 Goal Status: Met Interventions: Assess patient/caregiver ability to obtain necessary supplies Assess patient/caregiver ability to perform ulcer/skin care regimen upon admission and as needed Assess ulceration(s) every visit Notes: Electronic Signature(s) Signed: 01/04/2022 5:30:43 PM By: Baruch Gouty RN, BSN Entered By: Baruch Gouty on 01/04/2022 13:24:49 -------------------------------------------------------------------------------- Negative Pressure Wound Therapy Maintenance (NPWT) Details Patient Name: Date of Service: IOAN, LANDINI 01/04/2022 12:30 PM Medical Record Number: [id number] Patient Account Number: [id number] Date of Birth/Sex: Treating RN: [birth year] (71 y.o. [name] Primary Care Lacinda Curvin: [name] Other Clinician: Referring Katricia Prehn: Treating Nychelle Cassata/Extender: [name] in Treatment: 38 NPWT Maintenance Performed for: Wound #22 Left,  Lateral Lower Leg Additional Injuries Covered: No Performed By: Baruch Gouty, RN Type: VAC System Coverage Size (sq cm): 17.6 Pressure Type: Constant Pressure Setting: 125 mmHG Drain Type: None Sponge/Dressing Type: Foam, Black Date Initiated: 07/27/2021 Dressing Removed: No Quantity of Sponges/Gauze  Removed: 1 Canister Changed: No Dressing Reapplied: No Quantity of Sponges/Gauze Inserted: 1 Respones T Treatment: o good Days On NPWT : 162 Post Procedure Diagnosis Same as Pre-procedure Electronic Signature(s) Signed: 01/04/2022 5:30:43 PM By: Baruch Gouty RN, BSN Entered By: Baruch Gouty on 01/04/2022 13:34:50 -------------------------------------------------------------------------------- Pain Assessment Details Patient Name: Date of Service: [name]. 01/04/2022 12:30 PM Medical Record Number: [id number] Patient Account Number: [id number] Date of Birth/Sex: Treating RN: 09-Aug-1950 (71 y.o. [name] Primary Care Dazja Houchin: [name] Other Clinician: Referring Jaymison Luber: Treating Joellen Tullos/Extender: [name] in Treatment: 38 Active Problems Location of Pain Severity and Description of Pain Patient Has Paino No Site Locations Rate the pain. Rate the pain. Current Pain Level: 0 Pain Management and Medication Current Pain Management: Electronic Signature(s) Signed: 01/04/2022 5:30:43 PM By: Baruch Gouty RN, BSN Entered By: Baruch Gouty on 01/04/2022 13:13:11 -------------------------------------------------------------------------------- Patient/Caregiver Education Details Patient Name: Date of Service: [name] 7/13/2023andnbsp12:30 PM Medical Record Number: [id number] Patient Account Number: [id number] Date of Birth/Gender: Treating RN: 07-03-1950 (71 y.o. [name] Primary Care Physician: [name] Other Clinician: Referring Physician: Treating Physician/Extender: [name] in Treatment: 44 Education Assessment Education Provided To: Patient Education Topics Provided Welcome T [facility]: o Methods: Explain/Verbal Responses: Reinforcements needed, State content correctly Wound/Skin Impairment: Methods: Explain/Verbal Responses: Reinforcements  needed, State content correctly Electronic Signature(s) Signed: 01/04/2022 5:30:43 PM By: Baruch Gouty RN, BSN Entered By: Baruch Gouty on 01/04/2022 13:25:24 -------------------------------------------------------------------------------- Wound Assessment Details Patient Name: Date of Service: [name]. 01/04/2022 12:30 PM Medical Record Number: [id number] Patient Account Number: [id number] Date of Birth/Sex: Treating RN: 10-Oct-1950 (71 y.o. [name] Primary Care Nancy Manuele: [name] Other Clinician: Referring Brendan Gruwell: Treating Demareon Coldwell/Extender: [name] in Treatment: 38 Wound Status Wound Number: 18 Primary Diabetic Wound/Ulcer of the Lower Extremity Etiology: Wound Location: Left, Plantar Metatarsal head first Wound Open Wounding Event: Gradually Appeared Status: Date Acquired: 08/23/2020 Comorbid Glaucoma, Sleep Apnea, Hypertension, Peripheral Arterial Disease, Weeks Of Treatment: 38 History: Peripheral Venous Disease, Type II Diabetes, Gout, Osteoarthritis, Clustered Wound: No Neuropathy Photos Wound Measurements Length: (cm) Width: (cm) Depth: (cm) Area: (cm) Volume: (cm) 0 % Reduction in Area: 100% 0 % Reduction in Volume: 100% 0 Epithelialization: Large (67-100%) 0 Tunneling: No 0 Undermining: No Wound Description Classification: Grade 2 Wound Margin: Flat and Intact Exudate Amount: Small Exudate Type: Serosanguineous Exudate Color: red, brown Wound Bed Granulation Amount: None Present (0%) Exposed Structure Necrotic Amount: None Present (0%) Fascia Exposed: No Fat Layer (Subcutaneous Tissue) Exposed: No Tendon Exposed: No Muscle Exposed: No Joint Exposed: No Bone Exposed: No Electronic Signature(s) Signed: 01/04/2022 5:30:43 PM By: Baruch Gouty RN, BSN Entered By: Baruch Gouty on 01/04/2022 13:26:52 -------------------------------------------------------------------------------- Wound  Assessment Details Patient Name: Date of Service: [name]. 01/04/2022 12:30 PM Medical Record Number: [id number] Patient Account Number: [id number] Date of Birth/Sex: Treating RN: Apr 07, 1951 (71 y.o. [name] Primary Care Robena Ewy: [name] Other Clinician: Referring Taedyn Glasscock: Treating Barbi Kumagai/Extender: [name] in Treatment: 38 Wound Status Wound Number: 22 Primary Cyst Etiology: Wound Location: Left, Lateral Lower Leg Wound Open Wounding Event: Bump Status: Date Acquired: 06/03/2021 Comorbid Glaucoma, Sleep  Apnea, Hypertension, Peripheral Arterial Disease, Weeks Of Treatment: 30 History: Peripheral Venous Disease, Type II Diabetes, Gout, Osteoarthritis, Clustered Wound: No Neuropathy Photos Wound Measurements Length: (cm) 8.8 Width: (cm) 2 Depth: (cm) 0.1 Area: (cm) 13.823 Volume: (cm) 1.382 % Reduction in Area: -738.3% % Reduction in Volume: -4.8% Epithelialization: Small (1-33%) Tunneling: No Undermining: No Wound Description Classification: Full Thickness With Exposed Support Structures Wound Margin: Flat and Intact Exudate Amount: Medium Exudate Type: Serosanguineous Exudate Color: red, brown Foul Odor After Cleansing: No Slough/Fibrino No Wound Bed Granulation Amount: Large (67-100%) Exposed Structure Granulation Quality: Red Fascia Exposed: No Necrotic Amount: None Present (0%) Fat Layer (Subcutaneous Tissue) Exposed: Yes Tendon Exposed: No Muscle Exposed: No Joint Exposed: No Bone Exposed: No Treatment Notes Wound #22 (Lower Leg) Wound Laterality: Left, Lateral Cleanser Soap and Water Discharge Instruction: May shower and wash wound with dial antibacterial soap and water prior to dressing change. Wound Cleanser Discharge Instruction: Cleanse the wound with wound cleanser prior to applying a clean dressing using gauze sponges, not tissue or cotton balls. Peri-Wound Care Triamcinolone 15  (g) Discharge Instruction: Use triamcinolone 15 (g) as directed Sween Lotion (Moisturizing lotion) Discharge Instruction: Apply moisturizing lotion as directed Topical Primary Dressing VAC Secondary Dressing Secured With Compression Wrap CoFlex TLC XL 2-layer Compression System 4x7 (in/yd) Discharge Instruction: Apply CoFlex 2-layer compression as directed. (alt for 4 layer) Compression Stockings Add-Ons Electronic Signature(s) Signed: 01/04/2022 5:30:43 PM By: Baruch Gouty RN, BSN Entered By: Baruch Gouty on 01/04/2022 13:27:28 -------------------------------------------------------------------------------- Vitals Details Patient Name: Date of Service: [name]. 01/04/2022 12:30 PM Medical Record Number: [id number] Patient Account Number: [id number] Date of Birth/Sex: Treating RN: [birth year] (71 y.o. [name] Primary Care Beaux Wedemeyer: [name] Other Clinician: Referring Ozzy Bohlken: Treating Shahidah Nesbitt/Extender: [name] in Treatment: 38 Vital Signs Time Taken: 13:12 Temperature (F): 99 Height (in): 74 Pulse (bpm): 73 Weight (lbs): 238 Respiratory Rate (breaths/min): 20 Body Mass Index (BMI): 30.6 Blood Pressure (mmHg): 136/78 Capillary Blood Glucose (mg/dl): 174 Reference Range: 80 - 120 mg / dl Notes glucose per pts meter at present Electronic Signature(s) Signed: 01/04/2022 5:30:43 PM By: Baruch Gouty RN, BSN Entered By: Baruch Gouty on 01/04/2022 13:13:04

## 2022-01-08 NOTE — Progress Notes (Signed)
ARLESTER, KEEHAN ([id number]) Visit Report for 01/04/2022 Chief Complaint Document Details Patient Name: Date of Service: DWON, SKY 01/04/2022 12:30 PM Medical Record Number: [id number] Patient Account Number: [id number] Date of Birth/Sex: Treating RN: 1950/07/23 (71 y.o. [name] Primary Care Provider: [name] Other Clinician: Referring Provider: Treating Provider/Extender: [name] Information Obtained from: Patient Chief Complaint Left leg and foot ulcers 04/12/2021; patient is here for wounds on his left lower leg and left plantar foot over the first metatarsal head Electronic Signature(s) Signed: 01/04/2022 1:41:08 PM By: Fredirick Maudlin MD FACS Entered By: Fredirick Maudlin on 01/04/2022 13:41:08 -------------------------------------------------------------------------------- HPI Details Patient Name: Date of Service: [name] 01/04/2022 12:30 PM Medical Record Number: [id number] Patient Account Number: [id number] Date of Birth/Sex: Treating RN: [birth year] (71 y.o. [name] Primary Care Provider: [name] Other Clinician: Referring Provider: Treating Provider/Extender: [name] in Treatment: 25 History of Present Illness HPI Description: 10/11/17; Mr. Gilkison is a 71 year old man who tells me that in 2015 he slipped down the latter traumatizing his left leg. He developed a wound in the same spot the area that we are currently looking at. He states this closed over for the most part although he always felt it was somewhat unstable. In 2016 he hit the same area with the door of his car had this reopened. He tells me that this is never really closed although sometimes an inflow it remains open on a constant basis. He has not been using any specific dressing to this except for topical antibiotics the nature of which were not really sure. His primary doctor did send him to see  Dr. [name] of interventional cardiology. He underwent an angiogram on 08/06/17 and he underwent a PTA and directional atherectomy of the lesser distal SFA and popliteal arteries which resulted in brisk improvement in blood flow. It was noted that he had 2 vessel runoff through the anterior tibial and peroneal. He is also been to see vascular and interventional radiologist. He was not felt to have any significant superficial venous insufficiency. Presumably is not a candidate for any ablation. It was suggested he come here for wound care. The patient is a type II diabetic on insulin. He also has a history of venous insufficiency. ABIs on the left were noncompressible in our clinic 10/21/17; patient we admitted to the clinic last week. 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 We put Iodosorb on him after an aggressive debridement and 3 layer compression. He complained of pain in his ankle and itching with is skin in fact he scratched the area on the medial calf superiorly at the rim of our wraps and he has 2 small open areas in that location today which are new. I changed his primary dressing today to silver collagen. As noted he is already had revascularization and does not have any significant superficial venous insufficiency that would be amenable to ablation 10/28/17; patient admitted to the clinic 2 weeks ago. He has a smaller Wound. Scratch injury from last week revealed. There is large wound over the tibial area. This is smaller. Granulation looks healthy. No need for debridement. 11/04/17; the wound on the left lateral calf looks better. Improved dimensions. Surface of this looks better. We've been maintaining him and Kerlix Coban wraps. He finds this much more comfortable. Silver collagen dressing 11/11/17; left lateral Wound continues to look  healthy be making progress. Using a #5 curet I removed removed nonviable skin from the  surface of the wound and then necrotic debris from the wound surface. Surface of the wound continues to look healthy. He also has an open area on the left great toenail bed. We've been using topical antibiotics. 11/19/17; left anterior lateral wound continues to look healthy but it's not closed. He also had a small wound above this on the left leg Initially traumatic wounds in the setting of significant chronic venous insufficiency and stasis dermatitis 11/25/17; left anterior wounds superiorly is closed still a small wound inferiorly. 12/02/17; left anterior tibial area. Arrives today with adherent callus. Post debridement clearly not completely closed. Hydrofera Blue under 3 layer compression. 12/09/17; left anterior tibia. Circumferential eschar however the wound bed looks stable to improved. We've been using Hydrofera Blue under 3 layer compression 12/17/17; left anterior tibia. Apparently this was felt to be closed however when the wrap was taken off there is a skin tear to reopen wounds in the same area we've been using Hydrofera Blue under 3 layer compression 12/23/17 left anterior tibia. Not close to close this week apparently the [facility] was stuck to this again. Still circumferential eschar requiring debridement. I put a contact layer on this this time under the Hydrofera Blue 12/31/17; left anterior tibia. Wound is better slight amount of hyper-granulation. Using Hydrofera Blue over Adaptic. 01/07/18; left anterior tibia. The wound had some surface eschar however after this was removed he has no open wound.he was already revascularized by Dr. [name] when he came to our clinic with atherectomy of the left SFA and popliteal artery. He was also sent to interventional radiology for venous reflux studies. He was not felt to have significant reflux but certainly has chronic venous changes of his skin with hemosiderin deposition around this area. He will definitely need to lubricate his skin and wear  compression stocking and I've talked to him about this. READMISSION 05/26/2018 This is a now 72 year old man we cared for with traumatic wounds on his left anterior lower extremity. He had been previously revascularized during that admission by Dr. [name]. Apparently in follow-up Dr. [name] noted that he had deterioration in his arterial status. He underwent a stent placement in the distal left SFA on 04/22/2018. 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 He went back to the angiography suite on 04/30/2018 he underwent PTA and balloon angioplasty of the occluded left mid anterior tibial artery, thrombotic occlusion went from 100 to 0% which reconstitutes the posterior tibial artery. He had thrombectomy and aspiration of the peroneal artery. The stent placed in the distal SFA left SFA was still occluded. 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 He has been recuperating at [place] home in [facility] after the most recent hospitalization. He is going home tomorrow. He tells me that 2 weeks ago he traumatized the tip of his left fifth toe. He came in urgently for our review of this. This was a history of before I noted that Dr. [name] had already noted dry gangrenous changes of the left fifth toe 06/09/2018; 2-week follow-up. I did contact Dr. [name] after his last appointment and he apparently saw 1 of Dr. Irven Shelling colleagues the next  day. He does not follow-up with Dr. [name] himself until Thursday of this week. 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 Nevertheless there is no evidence of infection no drainage and no pain. He had a new area that this week when we were signing him in today on the left anterior mid tibia  area, this is in close proximity to the previous wound we have dealt with in this clinic. 06/23/2018; 2-week follow-up. I did not receive a recent note from Dr. [name] to review today. Our office is trying to obtain this. He is apparently not planning to do further vascular interventions and wondered about compression to try and help with the patient's chronic venous insufficiency. However we are also concerned about the arterial flow. He arrives in clinic today with a new area on the left third toe. The areas on the calf/anterior tibia are close to closing. The left fifth toe is still mummified using Betadine. -In reviewing things with the patient he has what sounds like claudication with mild to moderate amount of activity. 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 I prescribed Levaquin over the phone while we attempted to arrange a plan of care. However the patient called yesterday to report he had low-grade fever and he came in today acutely. There is been a marked deterioration in the left third toe with spreading cellulitis up into the dorsal left foot. He was referred to the emergency room. Readmission: 06/29/2020 patient presents today for reevaluation here in our clinic he was previously treated by Dr. Dellia Nims at the latter part of 2019 in 2 the beginning of 2020. Subsequently we have not seen him since that time in the interim he did have evaluation with vein and vascular specialist specifically Dr. Anice Paganini who did perform quite extensive work for a left femoral to anterior tibial artery bypass. With that being said in the interim the patient has developed significant lymphedema and has wounds that he tells me have really never healed in regard to the incision site on the left leg. He also has multiple wounds on the feet for various reasons some of which is that he tends to pick at his feet. Fortunately there is no signs of active infection systemically at  this time he does have some wounds that are little bit deeper but most are fairly superficial he seems to have good blood flow and overall everything appears to be healthy I see no bone exposed and no obvious signs of osteomyelitis. I do not know that he necessarily needs a x-ray at this point although that something we could consider depending on how things progress. The patient does have a history of lymphedema, diabetes, this is type II, chronic kidney disease stage III, hypertension, and history of peripheral vascular disease. 07/05/2020; patient admitted last week. Is a patient I remember from 2019 he had a spreading infection involving the left foot and we sent him to the hospital. 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 He subsequently had a left femoral to anterior tibial bypass by Dr.Cain vein and vascular. He also has severe lymphedema with chronic skin changes related to that on the left leg. The most problematic area that was new today was on the left medial great toe. This was apparently a small area last week there was purulent drainage which our intake nurse cultured. Also areas on the left medial  foot and heel left lateral foot. He has 2 areas on the left medial calf left lateral calf in the setting of the severe lymphedema. 07/13/2020 on evaluation today patient appears to be doing better in my opinion compared to his last visit. The good news is there is no signs of active infection systemically and locally I do not see any signs of infection either. He did have an x-ray which was negative that is great news he had a culture which showed MRSA but at the same time he is been on the doxycycline which has helped. I do think we may want to extend this for 7 additional days 1/25; patient admitted to the clinic a few weeks ago. 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 We have been putting him under  compression his edema control is a lot better but he is severe verricused skin on the left leg. He is really done quite well he still has an open area on the left medial calf and the left medial first metatarsal head. We have been using silver collagen on the leg silver alginate on the foot 07/27/2020 upon evaluation today patient appears to be doing decently well in regard to his wounds. He still has a lot of dry skin on the left leg. Some of this is starting to peel back and I think he may be able to have them out by removing some that today. Fortunately there is no signs of active infection at this time on the left leg although on the right leg he does appear to have swelling and erythema as well as some mild warmth to touch. This does have been concerned about the possibility of cellulitis although within the differential diagnosis I do think that potentially a DVT has to be at least considered. We need to rule that out before proceeding would just call in the cellulitis. Especially since he is having pain in the posterior aspect of his calf muscle. 2/8; the patient had seen sparingly. He has severe skin changes of chronic lymphedema in the left leg thickened hyperkeratotic verrucous skin. He has an open wound on the medial part of the left first met head left mid tibia. He also has a rim of nonepithelialized skin in the anterior mid tibia. He brought in the AmLactin lotion that was been prescribed although I am not sure under compression and its utility. There concern about cellulitis on the right lower leg the last time he was here. He was put on on antibiotics. His DVT rule out was negative. The right leg looks fine he is using his stocking on this area 08/10/2020 upon evaluation today patient appears to be doing well with regard to his leg currently. He has been tolerating the dressing changes without complication. Fortunately there is no signs of active infection which is great news. Overall very  pleased with where things stand. 2/22; the patient still has an area on the medial part of the left first met his head. This looks better than when I last saw this earlier this month he has a rim of epithelialization but still some surface debris. Mostly everything on the left leg is healed. There is still a vulnerable in the left mid tibia area. 08/30/2020 upon evaluation today patient appears to be doing much better in regard to his wounds on his foot. Fortunately there does not appear to be any signs of active infection systemically though locally we did culture this last  week and it does appear that he does have MRSA currently. Nonetheless I think we will address that today I Minna send in a prescription for him in that regard. Overall though there does not appear to be any signs of significant worsening. 09/07/2020 on evaluation today patient's wounds over his left foot appear to be doing excellent. I do not see any signs of infection there is some callus buildup this can require debridement for certain but overall I feel like he is managing quite nicely. He still using the AmLactin cream which has been beneficial for him as well. 3/22; left foot wound is closed. There is no open area here. He is using ammonium lactate lotion to the lower extremities to help exfoliate dry cracked skin. He has compression stockings from elastic therapy in [place]. The wound on the medial part of his left first met head is healed today. READMISSION 04/12/2021 Mr. Prins is a patient we know fairly well he had a prolonged stay in clinic in 2019 with wounds on his left lateral and left anterior lower extremity in the setting of chronic venous insufficiency. More recently he was here earlier this year with predominantly an area on his left foot first metatarsal head plantar and he says the plantar foot broke down on its not long after we discharged him but he did not come back here. The last few months areas of broken  down on his left anterior and again the left lateral lower extremity. The leg itself is very swollen chronically enlarged a lot of hyperkeratotic dry Berry Q skin in the left lower leg. His edema extends well into the thigh. He was seen by Dr. Donzetta Matters. He had ABIs on 03/02/2021 showing an ABI on the right of 1 with a TBI of 0.72 his ABI in the left at 1.09 TBI of 0.99. Monophasic and biphasic waveforms on the right. On the left monophasic waveforms were noted he went on to have an angiogram on 03/27/2021 this showed the aortic aortic and iliac segments were free of flow-limiting stenosis the left common femoral vein to evaluate the left femoral to anterior tibial artery bypass was unobstructed the bypass was patent without any areas of stenosis. We discharged the patient in bilateral juxta lite stockings but very clearly that was not sufficient to control the swelling and maintain skin integrity. He is clearly going to need compression pumps. 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 This is fortunate because he is on his feet for long periods of time. 10/27; patient comes in with our intake nurse reporting copious amount of green drainage from the left anterior mid tibia the left dorsal foot and to a lesser extent the left medial mid tibia. We left the compression wrap on all week for the amount of edema in his left leg is quite a bit better. We use silver alginate as the primary dressing 11/3; edema control is good. Left anterior lower leg left medial lower leg and the plantar first metatarsal head. The left anterior lower leg required debridement. Deep tissue culture I did of this wound showed MRSA I put him on 10 days of doxycycline which she will start today. We have him in compression wraps. He has a security card and [employer] however he is retiring on November 15. We will need to then get him into a better offloading boot for the left foot perhaps a  total contact  cast 11/10; edema control is quite good. Left anterior and left medial lower leg wounds in the setting of chronic venous insufficiency and lymphedema. He also has a substantial area over the left plantar first metatarsal head. 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 He has significant hypergranulation on the left plantar foot wound. The patient is a diabetic but he does not have significant PAD 11/17; edema control is quite good. Left anterior and left medial lower leg wounds look better. The really concerning area remains the area on the left plantar first metatarsal head. He has a rim of epithelialization. 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 Starting today with a forefoot off loader but . possibly a total contact cast. 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 Arrives with a very significant odor from I think all of the wounds on the left leg and the left foot. Because of this I did not put a total contact cast on him today but will could still consider this. His wife was having cataract surgery which is the reason he missed the appointment 12/6. I saw this man 5 days ago with a swelling below the popliteal fossa. I thought he actually might have a Baker's cyst however the DVT rule out study that we could arrange right away was negative the technician told me this was not a ruptured Baker's cyst. We attempted to get this aspirated by under ultrasound guidance in interventional radiology however all they did was an ultrasound however it shows an extensive fluid collection 62 x 8 x 9.4 in the left thigh and left calf. The patient states he thinks this started 8 days  ago or so but he really is not complaining of any pain, fever or systemic symptoms. He has not ha 12/20; after some difficulty I managed to get the patient into see Dr. Donzetta Matters. Eventually he was taken into the hospital and had a drain put in the fluid collection below his left knee posteriorly extending into the posterior thigh. He still has the drain in place. Culture of this showed moderate staff aureus few Morganella and few Klebsiella he is now on doxycycline and ciprofloxacin as suggested by infectious disease he is on this for a month. The drain will remain in place until it stops draining 12/29; he comes in today with the 1 wound on his left leg and the area on the left plantar first met head significantly smaller. Both look healthy. He still has the drain in the left leg. He says he has to change this daily. Follows up with Dr. Donzetta Matters on January 11. 06/29/2021; the wounds that I am following on the left leg and left first met head continued to be quite healthy. However the area where his inferior drain is in place had copious amounts of drainage which was green in color. The wound here is larger. Follows up with Dr. Gwenlyn Saran of vein and vascular his surgeon next week as well as infectious disease. He remains on ciprofloxacin and doxycycline. He is not complaining of excessive pain in either one of the drain areas 1/12; the patient saw vascular surgery and infectious disease.  Vascular surgery has left the drain in place as there was still some notable drainage still see him back in 2 weeks. Dr. Velna Ochs stop the doxycycline and ciprofloxacin and I do not believe he follows up with them at this point. Culture I did last week showed both doxycycline resistant MRSA and Pseudomonas not sensitive to ciprofloxacin although only in rare titers 1/19; the patient's wound on the left anterior lower leg is just about healed. We have continued healing of the area that was medially on the left leg. Left first plantar  metatarsal head continues to get smaller. The major problem here is his 2 drain sites 1 on the left upper calf and lateral thigh. There is purulent drainage still from the left lateral thigh. I gave him antibiotics last week but we still have recultured. He has the drain in the area I think this is eventually going to have to come out. I suspect there will be a connecting wound to heal here perhaps with improved VAc 1/26; the patient had his drain removed by vein and vascular on 1/25/. This was a large pocket of fluid in his left thigh that seem to tunnel into his left upper calf. 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 His mention his Penrose drain was removed today. He now has a tunneling wound on his left calf and left thigh. Both of these probe widely towards each other although I cannot really prove that they connect. Both wounds on his lower leg anteriorly are closed and his area over the first metatarsal head on his right foot continues to improve. We are using Hydrofera Blue here. He also saw infectious disease culture of the abscess they noted was polymicrobial with MRSA, Morganella and Klebsiella he was treated with doxycycline and ciprofloxacin for 4 weeks ending on 07/03/2021. They did not recommend any further antibiotics. Notable that while he still had the Penrose drain in place last week he had purulent drainage coming out of the inferior IandD site this grew [facility], MRSA and Pseudomonas but there does not appear to be any active infection in this area today with the drain out and he is not systemically unwell 2/2; with regards to the drain sites the superior one on the thigh actually is closed down the one on the upper left lateral calf measures about 8 and half centimeters which is an improvement seems to be less prominent although still with a lot of drainage. The only remaining wound is over the first metatarsal head on the left foot and this looks to be  continuing to improve with Hydrofera Blue. 2/9; the area on his plantar left foot continues to contract. Callus around the wound edge. The drain sites specifically have not come down in depth. We put the wound VAC on Monday he changed the canister late last night our intake nurse reported a pocket of fluid perhaps caused by our compression wraps 2/16; continued improvement in left foot plantar wound. drainage site in the calf is not improved in terms of depth (wound vac) 2/23; continued improvement in the left foot wound over the first metatarsal head. With regards to the drain sites the area on his thigh laterally is healed however the open area on his calf is small in terms of circumference by still probes in by about 15 cm. Within using the wound VAC. Hydrofera Blue on his foot 08/24/2021: The left first metatarsal head wound continues to improve. The wound bed is healthy with  just some surrounding callus. Unfortunately the open drain site on his calf remains open and tunnels at least 15 cm (the extent of a Q-tip). This is despite several weeks of wound VAC treatment. Based on reading back through the notes, there has been really no significant change in the depth of the wound, although the orifice is smaller and the more cranial wound on his thigh has closed. I suspect the tunnel tracks nearly all the way to this location. 08/31/2021: Continued improvement in the left first metatarsal head wound. There has been absolutely no improvement to the long tunnel from his open drain site on his calf. We have tried to get him into see vascular surgery sooner to consider the possibility of simply filleting the tract open and allowing it to heal from the bottom up, likely with a wound VAC. They have not yet scheduled a sooner appointment than his current mid April 09/14/2021: He was seen by vascular surgery and they took him to the operating room last week. They opened a portion of the tunnel, but did not extend  the entire length of the known open subcutaneous tract. I read Dr. Claretha Cooper operative note and it is not clear from that documentation why only a portion of the tract was opened. The heaped up granulation tissue was curetted and removed from at least some portion of the tract. They did place a wound VAC and applied an Unna boot to the leg. The ulcer on his left first metatarsal head is smaller today. The bed looks good and there is just a small amount of surrounding callus. 09/21/2021: The ulcer on his left first metatarsal head looks to be stalled. There is some callus surrounding the wound but the wound bed itself does not appear particularly dynamic. The tunnel tract on his lateral left leg seems to be roughly the same length or perhaps slightly smaller but the wound bed appears healthy with good granulation tissue. He opened up a new wound on his medial thigh and the site of a prior surgical incision. He says that he did this unconsciously in his sleep by scratching. 09/28/2021: Unfortunately, the ulcer on his left first metatarsal head has extended underneath the callus toward the dorsum of his foot. The medial thigh wounds are roughly the same. The tunnel on his lateral left leg continues to be problematic; it is longer than we are able to actually probe with a Q-tip. I am still not certain as to why Dr. Donzetta Matters did not open this up entirely when he took the patient to the operating room. We will likely be back in the same situation with just a small superficial opening in a long unhealed tract, as the open portion is granulating in nicely. 10/02/2021: The patient was initially scheduled for a nurse visit, but we are also applying a total contact cast today. The plantar foot wound looks clean without significant accumulated callus. We have been applying Prisma silver collagen to the site. 10/05/2021: The patient is here for his first total contact cast change. We have tried using gauze packing strips in  the tunnel on his lateral leg wound, but this does not seem to be working any better than the white VAC foam. The foot ulcer looks about the same with minimal periwound callus. Medial thigh wound is clean with just some overlying eschar. 10/12/2021: The plantar foot wound is stable without any significant accumulation of periwound callus. The surface is viable with good granulation tissue. The medial thigh wounds are  much smaller and are epithelializing. On the other hand, he had purulent drainage coming from the tunnel on his lateral leg. He does go back to see Dr. Donzetta Matters next week and is planning to ask him why the wound tunnel was not completely opened at the time of his most recent operation. 10/19/2021: The plantar foot wound is markedly improved and has epithelial tissue coming through the surface. The medial thigh wounds are nearly closed with just a tiny open area. He did see Dr. Donzetta Matters earlier this week and apparently they did discuss the possibility of opening the sinus tract further and enabling a wound VAC application. Apparently there are some limits as to what Dr. 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 I think if we could get the tract open to the level of the popliteal fossa, this would greatly aid in her ability to get this chart closed. That being said, however, today when I probed the tract with a Q-tip, I was not able to insert the entirety of the Q-tip as I have on previous occasions. The tunnel is shorter by about 4 cm. The surface is clean with good granulation tissue and no further episodes of purulent drainage. 10/30/2021: Last week, the patient underwent surgery and had the long tract in his leg opened. There was a rind that was debrided, according to the operative report. His medial thigh ulcers are closed. The plantar foot wound is clean with a good surface and some built up surrounding callus. 11/06/2021: The overall dimensions of the large  wound on his lateral leg remain about the same, but there is good granulation tissue present and the tunneling is a little bit shorter. He has a new wound on his anterior tibial surface, in the same location where he had a similar lesion in the past. The plantar foot wound is clean with some buildup surrounding callus. Just toward the medial aspect of his foot, however, there is an area of darkening that once debrided, revealed another opening in the skin surface. 11/13/2021: The anterior tibial surface wound is closed. The plantar foot wound has some surrounding callus buildup. The area of darkening that I debrided last week and revealed an opening in the skin surface has closed again. The tunnel in the large wound on his lateral leg has come in by about 3 cm. There is healthy granulation tissue on the entire wound surface. 11/23/2021: The patient was out of town last week and did wet-to-dry dressings on his large wound. 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 Unfortunately, he picked open the wound on his left medial thigh. He says that it was itching and he just could not stop scratching it until it was open again. The wound on his plantar foot is smaller and has not accumulated a tremendous amount of callus. The lateral leg wound is shallower and the tunnel has also decreased in depth. There is just a little bit of slough accumulation on the surface. 11/30/2021: Another portion of his left medial thigh has been opened up. All of these wounds are fairly superficial with just a little bit of slough and eschar accumulation. The wound on his plantar foot is almost closed with just a bit of eschar and periwound callus accumulation. The lateral leg wound is nearly flush with the surrounding skin and the tunnel is markedly shallower. 12/07/2021: There is just 1 open area on his left medial  thigh. It is clean with just a little bit of  perimeter eschar. The wound on his plantar foot continues to contract and just has some eschar and periwound callus accumulation. The lateral leg wound is closing at the more distal aspect and the tunnel is smaller. The surface is nearly flush with the surrounding skin and it has a good bed of granulation tissue. 12/14/2021: The thigh and foot wounds are closed. The lateral leg wound has closed over approximately half of its length. The tunnel continues to contract and the surface is now flush with the surrounding skin. The wound bed has robust granulation tissue. 12/22/2021: The thigh and foot wounds have reopened. The foot wound has a lot of callus accumulation around and over it. The thigh wound is tiny with just a little bit of slough in the wound bed. The lateral leg wound continues to contract. His vascular surgeon took the wound VAC off earlier in the week and the patient has been doing wet-to-dry dressings. There is a little slough accumulation on the surface. The tunnel is about 3 cm in depth at this point. 12/28/2021: The thigh wound is closed again. The foot wound has some callus that subsequently has peeled back exposing just a small slit of a wound. The lateral leg wound Is down to about half the size that it originally was and the tunnel is down to about half a centimeter in depth. 01/04/2022: The thigh wound remains closed. The foot wound has heavy callus overlying the wound site. Once this was debrided, the wound was found to be closed. The lateral leg wound is smaller again this week and very superficial. No tunnel could be identified. Electronic Signature(s) Signed: 01/04/2022 1:44:21 PM By: Fredirick Maudlin MD FACS Entered By: Fredirick Maudlin on 01/04/2022 13:44:21 -------------------------------------------------------------------------------- Paring/cutting 1 benign hyperkeratotic lesion Details Patient Name: Date of Service: YECHESKEL, KUREK 01/04/2022 12:30 PM Medical Record  Number: [id number] Patient Account Number: [id number] Date of Birth/Sex: Treating RN: 05/03/1951 (71 y.o. [name] Primary Care Provider: [name] Other Clinician: Referring Provider: Treating Provider/Extender: [name] in Treatment: 97 Procedure Performed for: Wound #18 Left,Plantar Metatarsal head first Performed By: Physician Fredirick Maudlin, MD Post Procedure Diagnosis Same as Pre-procedure Notes using #3 curette Electronic Signature(s) Signed: 01/04/2022 1:54:34 PM By: Fredirick Maudlin MD FACS Signed: 01/04/2022 5:30:43 PM By: Baruch Gouty RN, BSN Entered By: Baruch Gouty on 01/04/2022 13:34:05 -------------------------------------------------------------------------------- Physical Exam Details Patient Name: Date of Service: [name] 01/04/2022 12:30 PM Medical Record Number: [id number] Patient Account Number: [id number] Date of Birth/Sex: Treating RN: 1950/08/31 (71 y.o. [name] Primary Care Provider: [name] Other Clinician: Referring Provider: Treating Provider/Extender: [name] in Treatment: 80 Constitutional . . . . No acute distress.Marland Kitchen Respiratory Normal work of breathing on room air.. Notes 01/04/2022: The thigh wound remains closed. The foot wound has heavy callus overlying the wound site. Once this was debrided, the wound was found to be closed. The lateral leg wound is smaller again this week and very superficial. No tunnel could be identified. Electronic Signature(s) Signed: 01/04/2022 1:44:52 PM By: Fredirick Maudlin MD FACS Entered By: Fredirick Maudlin on 01/04/2022 13:44:52 -------------------------------------------------------------------------------- Physician Orders Details Patient Name: Date of Service: [name] 01/04/2022 12:30 PM Medical Record Number: [id number] Patient Account Number: [id number] Date of Birth/Sex: Treating RN: 1951/01/28 (71 y.o.  [name] Primary Care Provider: Other Clinician: [name] Referring Provider: Treating Provider/Extender: Lorina Rabon  Weeks in Treatment: 21 Verbal / Phone Orders: No Diagnosis Coding ICD-10 Coding Code Description E11.621 Type 2 diabetes mellitus with foot ulcer 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 I89.0 Lymphedema, not elsewhere classified I87.322 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verity 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 - Dr Celine Ahr - Room 1 Return A Friday 7/21 @ 10:30 am Nurse Visit: - Tuesday 7/18 @ 11:15 RM 1 Bathing/ Shower/ Hygiene May shower with protection but do not get wound dressing(s) wet. - Use a cast protector so you can shower without getting your wrap(s) wet Negative Presssure Wound Therapy Wound #22 Left,Lateral Lower Leg Wound Vac to wound continuously at 126m/hg pressure - change 2 times per week in clinic Black Foam Edema Control - Lymphedema / SCD / Other Elevate legs to the level of the heart or above for 30 minutes daily and/or when sitting, a frequency of: - throughout the day Avoid standing for long periods of time. Patient to wear own compression stockings every day. - on right leg; Moisturize legs daily. - Ammonium lactate to right leg daily Wound Treatment Wound #22 - Lower Leg Wound Laterality: Left, Lateral Cleanser: Soap and Water 1 x Per Week/30 Days Discharge Instructions: May shower and wash wound with dial antibacterial soap and water prior to dressing change. Cleanser: Wound Cleanser 1 x Per Week/30 Days Discharge Instructions: Cleanse the wound  with wound cleanser prior to applying a clean dressing using gauze sponges, not tissue or cotton balls. Peri-Wound Care: Triamcinolone 15 (g) 1 x Per Week/30 Days Discharge Instructions: Use triamcinolone 15 (g) as directed Peri-Wound Care: Sween Lotion (Moisturizing lotion) 1 x Per Week/30 Days Discharge Instructions: Apply moisturizing lotion as directed Prim Dressing: VAC ary 1 x Per Week/30 Days Compression Wrap: CoFlex TLC XL 2-layer Compression System 4x7 (in/yd) 1 x Per Week/30 Days Discharge Instructions: Apply CoFlex 2-layer compression as directed. (alt for 4 layer) Electronic Signature(s) Signed: 01/04/2022 1:54:34 PM By: CFredirick MaudlinMD FACS Entered By: CFredirick Maudlinon 01/04/2022 13:45:07 -------------------------------------------------------------------------------- Problem List Details Patient Name: Date of Service: LLucillie Garfinkel 01/04/2022 12:30 PM Medical Record Number: [id number] Account Number: [id number] of Birth/Sex: Treating RN: [birth year](71y.o. MErnestene MentionPrimary Care Provider: Other Clinician: MJilda PandaReferring Provider: Treating Provider/Extender: CBonnielee Haffin Treatment:: 65Active Problems ICD-10 Encounter Code Description Active Date MDM Diagnosis 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 04/12/2021 No Yes I89.0 Lymphedema, not elsewhere classified 04/12/2021 No Yes I87.322 Chronic venous hypertension (idiopathic) with inflammation of left lower 04/12/2021 No Yes extremity 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 ulcer of other part of left lower leg with other specified 04/12/2021 No Yes severity Inactive Problems ICD-10 Code Description Active Date Inactive Date E11.42 Type 2 diabetes mellitus with diabetic polyneuropathy 04/12/2021 04/12/2021 L02.416 Cutaneous abscess of left lower limb 06/13/2021 06/13/2021 L97.128 Non-pressure chronic ulcer of left  thigh with other specified severity 07/20/2021 07/20/2021 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 of other part of left foot with other specified severity 04/12/2021 04/12/2021 EK35.465Type 2 diabetes mellitus with foot ulcer 04/12/2021 04/12/2021 Resolved Problems Electronic Signature(s) Signed: 01/04/2022 1:40:51 PM By: CFredirick MaudlinMD FACS Entered By: CFredirick Maudlinon 01/04/2022 13:40:50 -------------------------------------------------------------------------------- Progress  Note Details Patient Name: Date of Service: CARLUS, STAY 01/04/2022 12:30 PM Medical Record Number: [id number] Patient Account Number: [id number] Date of Birth/Sex: Treating RN: 02-Sep-1950 (71 y.o. [name] Primary Care Provider: [name] Other Clinician: Referring Provider: Treating Provider/Extender: [name] Subjective Chief Complaint Information obtained from Patient Left leg and foot ulcers 04/12/2021; patient is here for wounds on his left lower leg and left plantar foot over the first metatarsal head History of Present Illness (HPI) 10/11/17; Mr. Chew is a 71 year old man who tells me that in 2015 he slipped down the latter traumatizing his left leg. He developed a wound in the same spot the area that we are currently looking at. He states this closed over for the most part although he always felt it was somewhat unstable. In 2016 he hit the same area with the door of his car had this reopened. He tells me that this is never really closed although sometimes an inflow it remains open on a constant basis. He has not been using any specific dressing to this except for topical antibiotics the nature of which were not really sure. His primary doctor did send him to see Dr. [name] of interventional cardiology. He underwent an angiogram on 08/06/17 and he underwent a PTA and directional atherectomy of the lesser distal SFA and popliteal arteries which  resulted in brisk improvement in blood flow. It was noted that he had 2 vessel runoff through the anterior tibial and peroneal. He is also been to see vascular and interventional radiologist. He was not felt to have any significant superficial venous insufficiency. Presumably is not a candidate for any ablation. It was suggested he come here for wound care. The patient is a type II diabetic on insulin. He also has a history of venous insufficiency. ABIs on the left were noncompressible in our clinic 10/21/17; patient we admitted to the clinic last week. 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 We put Iodosorb on him after an aggressive debridement and 3 layer compression. He complained of pain in his ankle and itching with is skin in fact he scratched the area on the medial calf superiorly at the rim of our wraps and he has 2 small open areas in that location today which are new. I changed his primary dressing today to silver collagen. As noted he is already had revascularization and does not have any significant superficial venous insufficiency that would be amenable to ablation 10/28/17; patient admitted to the clinic 2 weeks ago. He has a smaller Wound. Scratch injury from last week revealed. There is large wound over the tibial area. This is smaller. Granulation looks healthy. No need for debridement. 11/04/17; the wound on the left lateral calf looks better. Improved dimensions. Surface of this looks better. We've been maintaining him and Kerlix Coban wraps. He finds this much more comfortable. Silver collagen dressing 11/11/17; left lateral Wound continues to look healthy be making progress. Using a #5 curet I removed removed nonviable skin from the surface of the wound and then necrotic debris from the wound surface. Surface of the wound continues to look healthy. ooHe also has an open area on the left great toenail bed. We've  been using topical antibiotics. 11/19/17; left anterior lateral wound continues to look healthy but it's not closed. ooHe also had a small wound above this on the left leg ooInitially traumatic wounds  in the setting of significant chronic venous insufficiency and stasis dermatitis 11/25/17; left anterior wounds superiorly is closed still a small wound inferiorly. 12/02/17; left anterior tibial area. Arrives today with adherent callus. Post debridement clearly not completely closed. Hydrofera Blue under 3 layer compression. 12/09/17; left anterior tibia. Circumferential eschar however the wound bed looks stable to improved. We've been using Hydrofera Blue under 3 layer compression 12/17/17; left anterior tibia. Apparently this was felt to be closed however when the wrap was taken off there is a skin tear to reopen wounds in the same area we've been using Hydrofera Blue under 3 layer compression 12/23/17 left anterior tibia. Not close to close this week apparently the [facility] was stuck to this again. Still circumferential eschar requiring debridement. I put a contact layer on this this time under the Hydrofera Blue 12/31/17; left anterior tibia. Wound is better slight amount of hyper-granulation. Using Hydrofera Blue over Adaptic. 01/07/18; left anterior tibia. The wound had some surface eschar however after this was removed he has no open wound.he was already revascularized by Dr. [name] when he came to our clinic with atherectomy of the left SFA and popliteal artery. He was also sent to interventional radiology for venous reflux studies. He was not felt to have significant reflux but certainly has chronic venous changes of his skin with hemosiderin deposition around this area. He will definitely need to lubricate his skin and wear compression stocking and I've talked to him about this. READMISSION 05/26/2018 This is a now 71 year old man we cared for with traumatic wounds on his left anterior lower  extremity. He had been previously revascularized during that admission by Dr. [name]. Apparently in follow-up Dr. [name] noted that he had deterioration in his arterial status. He underwent a stent placement in the distal left SFA on 04/22/2018. 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 He went back to the angiography suite on 04/30/2018 he underwent PTA and balloon angioplasty of the occluded left mid anterior tibial artery, thrombotic occlusion went from 100 to 0% which reconstitutes the posterior tibial artery. He had thrombectomy and aspiration of the peroneal artery. The stent placed in the distal SFA left SFA was still occluded. 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 He has been recuperating at [place] home in [facility] after the most recent hospitalization. He is going home tomorrow. He tells me that 2 weeks ago he traumatized the tip of his left fifth toe. He came in urgently for our review of this. This was a history of before I noted that Dr. [name] had already noted dry gangrenous changes of the left fifth toe 06/09/2018; 2-week follow-up. I did contact Dr. [name] after his last appointment and he apparently saw 1 of Dr. Irven Shelling colleagues the next day. He does not follow-up with Dr. [name] himself until Thursday of this week. 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 Nevertheless there is no evidence of infection no drainage and no pain. He had a new area that this week when we were signing him in today on the left anterior mid tibia area, this is in close proximity to the previous wound we have dealt with in this clinic. 06/23/2018; 2-week follow-up.  I did not receive a recent note from Dr. [name] to  review today. Our office is trying to obtain this. He is apparently not planning to do further vascular interventions and wondered about compression to try and help with the patient's chronic venous insufficiency. However we are also concerned about the arterial flow. ooHe arrives in clinic today with a new area on the left third toe. The areas on the calf/anterior tibia are close to closing. The left fifth toe is still mummified using Betadine. -In reviewing things with the patient he has what sounds like claudication with mild to moderate amount of activity. 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 I prescribed Levaquin over the phone while we attempted to arrange a plan of care. However the patient called yesterday to report he had low-grade fever and he came in today acutely. There is been a marked deterioration in the left third toe with spreading cellulitis up into the dorsal left foot. He was referred to the emergency room. Readmission: 06/29/2020 patient presents today for reevaluation here in our clinic he was previously treated by Dr. Dellia Nims at the latter part of 2019 in 2 the beginning of 2020. Subsequently we have not seen him since that time in the interim he did have evaluation with vein and vascular specialist specifically Dr. Anice Paganini who did perform quite extensive work for a left femoral to anterior tibial artery bypass. With that being said in the interim the patient has developed significant lymphedema and has wounds that he tells me have really never healed in regard to the incision site on the left leg. He also has multiple wounds on the feet for various reasons some of which is that he tends to pick at his feet. Fortunately there is no signs of active infection systemically at this time he does have some wounds that are little bit deeper but most are fairly superficial he seems to have good blood flow and overall everything appears to be  healthy I see no bone exposed and no obvious signs of osteomyelitis. I do not know that he necessarily needs a x-ray at this point although that something we could consider depending on how things progress. The patient does have a history of lymphedema, diabetes, this is type II, chronic kidney disease stage III, hypertension, and history of peripheral vascular disease. 07/05/2020; patient admitted last week. Is a patient I remember from 2019 he had a spreading infection involving the left foot and we sent him to the hospital. 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 He subsequently had a left femoral to anterior tibial bypass by Dr.[facility]. He also has severe lymphedema with chronic skin changes related to that on the left leg. The most problematic area that was new today was on the left medial great toe. This was apparently a small area last week there was purulent drainage which our intake nurse cultured. Also areas on the left medial foot and heel left lateral foot. He has 2 areas on the left medial calf left lateral calf in the setting of the severe lymphedema. 07/13/2020 on evaluation today patient appears to be doing better in my opinion compared to his last visit. The good news is there is no signs of active infection systemically and locally I do not see any signs of infection either. He did have an x-ray which was negative that is great news he had a  culture which showed MRSA but at the same time he is been on the doxycycline which has helped. I do think we may want to extend this for 7 additional days 1/25; patient admitted to the clinic a few weeks ago. 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 We have been putting him under compression his edema control is a lot better but he is severe verricused skin on the left leg. He is really done quite well he still has an open area on the left medial  calf and the left medial first metatarsal head. We have been using silver collagen on the leg silver alginate on the foot 07/27/2020 upon evaluation today patient appears to be doing decently well in regard to his wounds. He still has a lot of dry skin on the left leg. Some of this is starting to peel back and I think he may be able to have them out by removing some that today. Fortunately there is no signs of active infection at this time on the left leg although on the right leg he does appear to have swelling and erythema as well as some mild warmth to touch. This does have been concerned about the possibility of cellulitis although within the differential diagnosis I do think that potentially a DVT has to be at least considered. We need to rule that out before proceeding would just call in the cellulitis. Especially since he is having pain in the posterior aspect of his calf muscle. 2/8; the patient had seen sparingly. He has severe skin changes of chronic lymphedema in the left leg thickened hyperkeratotic verrucous skin. He has an open wound on the medial part of the left first met head left mid tibia. He also has a rim of nonepithelialized skin in the anterior mid tibia. He brought in the AmLactin lotion that was been prescribed although I am not sure under compression and its utility. There concern about cellulitis on the right lower leg the last time he was here. He was put on on antibiotics. His DVT rule out was negative. The right leg looks fine he is using his stocking on this area 08/10/2020 upon evaluation today patient appears to be doing well with regard to his leg currently. He has been tolerating the dressing changes without complication. Fortunately there is no signs of active infection which is great news. Overall very pleased with where things stand. 2/22; the patient still has an area on the medial part of the left first met his head. This looks better than when I last saw this  earlier this month he has a rim of epithelialization but still some surface debris. Mostly everything on the left leg is healed. There is still a vulnerable in the left mid tibia area. 08/30/2020 upon evaluation today patient appears to be doing much better in regard to his wounds on his foot. 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 Nonetheless I think we will address that today I Minna send in a prescription for him in that regard. Overall though there does not appear to be any signs of significant worsening. 09/07/2020 on evaluation today patient's wounds over his left foot appear to be doing excellent. I do not see any signs of infection there is some callus buildup this can require debridement for certain but overall I feel  like he is managing quite nicely. He still using the AmLactin cream which has been beneficial for him as well. 3/22; left foot wound is closed. There is no open area here. He is using ammonium lactate lotion to the lower extremities to help exfoliate dry cracked skin. He has compression stockings from elastic therapy in [place]. The wound on the medial part of his left first met head is healed today. READMISSION 04/12/2021 Mr. Debrosse is a patient we know fairly well he had a prolonged stay in clinic in 2019 with wounds on his left lateral and left anterior lower extremity in the setting of chronic venous insufficiency. More recently he was here earlier this year with predominantly an area on his left foot first metatarsal head plantar and he says the plantar foot broke down on its not long after we discharged him but he did not come back here. The last few months areas of broken down on his left anterior and again the left lateral lower extremity. The leg itself is very swollen chronically enlarged a lot of hyperkeratotic dry Berry Q skin in  the left lower leg. His edema extends well into the thigh. He was seen by Dr. Donzetta Matters. He had ABIs on 03/02/2021 showing an ABI on the right of 1 with a TBI of 0.72 his ABI in the left at 1.09 TBI of 0.99. Monophasic and biphasic waveforms on the right. On the left monophasic waveforms were noted he went on to have an angiogram on 03/27/2021 this showed the aortic aortic and iliac segments were free of flow-limiting stenosis the left common femoral vein to evaluate the left femoral to anterior tibial artery bypass was unobstructed the bypass was patent without any areas of stenosis. We discharged the patient in bilateral juxta lite stockings but very clearly that was not sufficient to control the swelling and maintain skin integrity. He is clearly going to need compression pumps. 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 This is fortunate because he is on his feet for long periods of time. 10/27; patient comes in with our intake nurse reporting copious amount of green drainage from the left anterior mid tibia the left dorsal foot and to a lesser extent the left medial mid tibia. We left the compression wrap on all week for the amount of edema in his left leg is quite a bit better. We use silver alginate as the primary dressing 11/3; edema control is good. Left anterior lower leg left medial lower leg and the plantar first metatarsal head. The left anterior lower leg required debridement. Deep tissue culture I did of this wound showed MRSA I put him on 10 days of doxycycline which she will start today. We have him in compression wraps. He has a security card and [employer] however he is retiring on November 15. We will need to then get him into a better offloading boot for the left foot perhaps a total contact cast 11/10; edema control is quite good. Left anterior and left medial lower leg wounds in the setting of chronic venous insufficiency and lymphedema.  He also has a substantial area over the left plantar first metatarsal head. 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 He has significant hypergranulation on the left plantar foot wound. The patient is a diabetic but he does not have significant  PAD 11/17; edema control is quite good. Left anterior and left medial lower leg wounds look better. The really concerning area remains the area on the left plantar first metatarsal head. He has a rim of epithelialization. 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 Starting today with a forefoot off loader but . possibly a total contact cast. 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 Arrives with a very significant odor from I think all of the wounds on the left leg and the left foot. Because of this I did not put a total contact cast on him today but will could still consider this. His wife was having cataract surgery which is the reason he missed the appointment 12/6. I saw this man 5 days ago with a swelling below the popliteal fossa. I thought he actually might have a Baker's cyst however the DVT rule out study that we could arrange right away was negative the technician told me this was not a ruptured Baker's cyst. We attempted to get this aspirated by under ultrasound guidance in interventional radiology however all they did was an ultrasound however it shows an extensive fluid collection 62 x 8 x 9.4 in the left thigh and left calf. The patient states he thinks this started 8 days ago or so but he really is not complaining of any pain, fever or systemic symptoms. He has not ha 12/20; after some difficulty I managed to get the patient into see  Dr. Donzetta Matters. Eventually he was taken into the hospital and had a drain put in the fluid collection below his left knee posteriorly extending into the posterior thigh. He still has the drain in place. Culture of this showed moderate staff aureus few Morganella and few Klebsiella he is now on doxycycline and ciprofloxacin as suggested by infectious disease he is on this for a month. The drain will remain in place until it stops draining 12/29; he comes in today with the 1 wound on his left leg and the area on the left plantar first met head significantly smaller. Both look healthy. He still has the drain in the left leg. He says he has to change this daily. Follows up with Dr. Donzetta Matters on January 11. 06/29/2021; the wounds that I am following on the left leg and left first met head continued to be quite healthy. However the area where his inferior drain is in place had copious amounts of drainage which was green in color. The wound here is larger. Follows up with Dr. Gwenlyn Saran of vein and vascular his surgeon next week as well as infectious disease. He remains on ciprofloxacin and doxycycline. He is not complaining of excessive pain in either one of the drain areas 1/12; the patient saw vascular surgery and infectious disease. Vascular surgery has left the drain in place as there was still some notable drainage still see him back in 2 weeks. Dr. Velna Ochs stop the doxycycline and ciprofloxacin and I do not believe he follows up with them at this point. Culture I did last week showed both doxycycline resistant MRSA and Pseudomonas not sensitive to ciprofloxacin although only in rare titers 1/19; the patient's wound on the left anterior lower leg is just about healed. We have continued healing  of the area that was medially on the left leg. Left first plantar metatarsal head continues to get smaller. The major problem here is his 2 drain sites 1 on the left upper calf and lateral thigh. There is purulent drainage still from  the left lateral thigh. I gave him antibiotics last week but we still have recultured. He has the drain in the area I think this is eventually going to have to come out. I suspect there will be a connecting wound to heal here perhaps with improved VAc 1/26; the patient had his drain removed by vein and vascular on 1/25/. This was a large pocket of fluid in his left thigh that seem to tunnel into his left upper calf. 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 His mention his Penrose drain was removed today. He now has a tunneling wound on his left calf and left thigh. Both of these probe widely towards each other although I cannot really prove that they connect. Both wounds on his lower leg anteriorly are closed and his area over the first metatarsal head on his right foot continues to improve. We are using Hydrofera Blue here. He also saw infectious disease culture of the abscess they noted was polymicrobial with MRSA, Morganella and Klebsiella he was treated with doxycycline and ciprofloxacin for 4 weeks ending on 07/03/2021. They did not recommend any further antibiotics. Notable that while he still had the Penrose drain in place last week he had purulent drainage coming out of the inferior IandD site this grew [facility], MRSA and Pseudomonas but there does not appear to be any active infection in this area today with the drain out and he is not systemically unwell 2/2; with regards to the drain sites the superior one on the thigh actually is closed down the one on the upper left lateral calf measures about 8 and half centimeters which is an improvement seems to be less prominent although still with a lot of drainage. The only remaining wound is over the first metatarsal head on the left foot and this looks to be continuing to improve with Hydrofera Blue. 2/9; the area on his plantar left foot continues to contract. Callus around the wound edge. The drain sites specifically have  not come down in depth. We put the wound VAC on Monday he changed the canister late last night our intake nurse reported a pocket of fluid perhaps caused by our compression wraps 2/16; continued improvement in left foot plantar wound. drainage site in the calf is not improved in terms of depth (wound vac) 2/23; continued improvement in the left foot wound over the first metatarsal head. With regards to the drain sites the area on his thigh laterally is healed however the open area on his calf is small in terms of circumference by still probes in by about 15 cm. Within using the wound VAC. Hydrofera Blue on his foot 08/24/2021: The left first metatarsal head wound continues to improve. The wound bed is healthy with just some surrounding callus. Unfortunately the open drain site on his calf remains open and tunnels at least 15 cm (the extent of a Q-tip). This is despite several weeks of wound VAC treatment. Based on reading back through the notes, there has been really no significant change in the depth of the wound, although the orifice is smaller and the more cranial wound on his thigh has closed. I suspect the tunnel tracks nearly all the way to this location.  08/31/2021: Continued improvement in the left first metatarsal head wound. There has been absolutely no improvement to the long tunnel from his open drain site on his calf. We have tried to get him into see vascular surgery sooner to consider the possibility of simply filleting the tract open and allowing it to heal from the bottom up, likely with a wound VAC. They have not yet scheduled a sooner appointment than his current mid April 09/14/2021: He was seen by vascular surgery and they took him to the operating room last week. They opened a portion of the tunnel, but did not extend the entire length of the known open subcutaneous tract. I read Dr. Claretha Cooper operative note and it is not clear from that documentation why only a portion of the tract was  opened. The heaped up granulation tissue was curetted and removed from at least some portion of the tract. They did place a wound VAC and applied an Unna boot to the leg. The ulcer on his left first metatarsal head is smaller today. The bed looks good and there is just a small amount of surrounding callus. 09/21/2021: The ulcer on his left first metatarsal head looks to be stalled. There is some callus surrounding the wound but the wound bed itself does not appear particularly dynamic. The tunnel tract on his lateral left leg seems to be roughly the same length or perhaps slightly smaller but the wound bed appears healthy with good granulation tissue. He opened up a new wound on his medial thigh and the site of a prior surgical incision. He says that he did this unconsciously in his sleep by scratching. 09/28/2021: Unfortunately, the ulcer on his left first metatarsal head has extended underneath the callus toward the dorsum of his foot. The medial thigh wounds are roughly the same. The tunnel on his lateral left leg continues to be problematic; it is longer than we are able to actually probe with a Q-tip. I am still not certain as to why Dr. Donzetta Matters did not open this up entirely when he took the patient to the operating room. We will likely be back in the same situation with just a small superficial opening in a long unhealed tract, as the open portion is granulating in nicely. 10/02/2021: The patient was initially scheduled for a nurse visit, but we are also applying a total contact cast today. The plantar foot wound looks clean without significant accumulated callus. We have been applying Prisma silver collagen to the site. 10/05/2021: The patient is here for his first total contact cast change. We have tried using gauze packing strips in the tunnel on his lateral leg wound, but this does not seem to be working any better than the white VAC foam. The foot ulcer looks about the same with minimal periwound  callus. Medial thigh wound is clean with just some overlying eschar. 10/12/2021: The plantar foot wound is stable without any significant accumulation of periwound callus. The surface is viable with good granulation tissue. The medial thigh wounds are much smaller and are epithelializing. On the other hand, he had purulent drainage coming from the tunnel on his lateral leg. He does go back to see Dr. Donzetta Matters next week and is planning to ask him why the wound tunnel was not completely opened at the time of his most recent operation. 10/19/2021: The plantar foot wound is markedly improved and has epithelial tissue coming through the surface. The medial thigh wounds are nearly closed with just a tiny open  area. He did see Dr. Donzetta Matters earlier this week and apparently they did discuss the possibility of opening the sinus tract further and enabling a wound VAC application. Apparently there are some limits as to what Dr. 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 I think if we could get the tract open to the level of the popliteal fossa, this would greatly aid in her ability to get this chart closed. That being said, however, today when I probed the tract with a Q-tip, I was not able to insert the entirety of the Q-tip as I have on previous occasions. The tunnel is shorter by about 4 cm. The surface is clean with good granulation tissue and no further episodes of purulent drainage. 10/30/2021: Last week, the patient underwent surgery and had the long tract in his leg opened. There was a rind that was debrided, according to the operative report. His medial thigh ulcers are closed. The plantar foot wound is clean with a good surface and some built up surrounding callus. 11/06/2021: The overall dimensions of the large wound on his lateral leg remain about the same, but there is good granulation tissue present and the tunneling is a little bit shorter. He has a new wound on his anterior  tibial surface, in the same location where he had a similar lesion in the past. The plantar foot wound is clean with some buildup surrounding callus. Just toward the medial aspect of his foot, however, there is an area of darkening that once debrided, revealed another opening in the skin surface. 11/13/2021: The anterior tibial surface wound is closed. The plantar foot wound has some surrounding callus buildup. The area of darkening that I debrided last week and revealed an opening in the skin surface has closed again. The tunnel in the large wound on his lateral leg has come in by about 3 cm. There is healthy granulation tissue on the entire wound surface. 11/23/2021: The patient was out of town last week and did wet-to-dry dressings on his large wound. 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 Unfortunately, he picked open the wound on his left medial thigh. He says that it was itching and he just could not stop scratching it until it was open again. The wound on his plantar foot is smaller and has not accumulated a tremendous amount of callus. The lateral leg wound is shallower and the tunnel has also decreased in depth. There is just a little bit of slough accumulation on the surface. 11/30/2021: Another portion of his left medial thigh has been opened up. All of these wounds are fairly superficial with just a little bit of slough and eschar accumulation. The wound on his plantar foot is almost closed with just a bit of eschar and periwound callus accumulation. The lateral leg wound is nearly flush with the surrounding skin and the tunnel is markedly shallower. 12/07/2021: There is just 1 open area on his left medial thigh. It is clean with just a little bit of perimeter eschar. The wound on his plantar foot continues to contract and just has some eschar and periwound callus accumulation. The lateral leg wound is closing at the more  distal aspect and the tunnel is smaller. The surface is nearly flush with the surrounding skin and it has a good bed of granulation tissue. 12/14/2021: The thigh and foot wounds are closed. The lateral leg wound has closed over approximately  half of its length. The tunnel continues to contract and the surface is now flush with the surrounding skin. The wound bed has robust granulation tissue. 12/22/2021: The thigh and foot wounds have reopened. The foot wound has a lot of callus accumulation around and over it. The thigh wound is tiny with just a little bit of slough in the wound bed. The lateral leg wound continues to contract. His vascular surgeon took the wound VAC off earlier in the week and the patient has been doing wet-to-dry dressings. There is a little slough accumulation on the surface. The tunnel is about 3 cm in depth at this point. 12/28/2021: The thigh wound is closed again. The foot wound has some callus that subsequently has peeled back exposing just a small slit of a wound. The lateral leg wound Is down to about half the size that it originally was and the tunnel is down to about half a centimeter in depth. 01/04/2022: The thigh wound remains closed. The foot wound has heavy callus overlying the wound site. Once this was debrided, the wound was found to be closed. The lateral leg wound is smaller again this week and very superficial. No tunnel could be identified. Patient History Information obtained from Patient. Family History Diabetes - Mother, Heart Disease - Paternal Grandparents,Mother,Father,Siblings, Stroke - Father, No family history of Cancer, Hereditary Spherocytosis, Hypertension, Kidney Disease, Lung Disease, Seizures, Thyroid Problems, Tuberculosis. Social History Former smoker - quit 1999, Marital Status - Married, Alcohol Use - Moderate, Drug Use - No History, Caffeine Use - Rarely. Medical History Eyes Patient has history of Glaucoma - both eyes Denies history of  Cataracts, Optic Neuritis Ear/Nose/Mouth/Throat Denies history of Chronic sinus problems/congestion, Middle ear problems Hematologic/Lymphatic Denies history of Anemia, Hemophilia, Human Immunodeficiency Virus, Lymphedema, Sickle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

## 2022-01-09 ENCOUNTER — Encounter (HOSPITAL_BASED_OUTPATIENT_CLINIC_OR_DEPARTMENT_OTHER): Payer: Medicare Other | Admitting: General Surgery

## 2022-01-09 DIAGNOSIS — E1151 Type 2 diabetes mellitus with diabetic peripheral angiopathy without gangrene: Secondary | ICD-10-CM | POA: Diagnosis not present

## 2022-01-12 ENCOUNTER — Encounter (HOSPITAL_BASED_OUTPATIENT_CLINIC_OR_DEPARTMENT_OTHER): Payer: Medicare Other | Admitting: General Surgery

## 2022-01-12 DIAGNOSIS — E1151 Type 2 diabetes mellitus with diabetic peripheral angiopathy without gangrene: Secondary | ICD-10-CM | POA: Diagnosis not present

## 2022-01-12 NOTE — Progress Notes (Signed)
[name], [name] ([id number]) Visit Report for 01/12/2022 Arrival Information Details Patient Name: Date of Service: [name], [name] 01/12/2022 10:30 A M Medical Record Number: [id number] Patient Account Number: [id number] Date of Birth/Sex: Treating RN: 03/25/1951 (71 y.o. [name] Primary Care Annalynne Ibanez: [name] Other Clinician: Referring Esten Dollar: Treating Juanelle Trueheart/Extender: [name] in Treatment: 39 Visit Information History Since Last Visit Added or deleted any medications: No Patient Arrived: Ambulatory Any new allergies or adverse reactions: No Arrival Time: 11:11 Had a fall or experienced change in No Accompanied By: self activities of daily living that may affect Transfer Assistance: None risk of falls: Patient Identification Verified: Yes Signs or symptoms of abuse/neglect since last visito No Secondary Verification Process Completed: Yes Implantable device outside of the clinic excluding No Patient Requires Transmission-Based Precautions: No cellular tissue based products placed in the center Patient Has Alerts: Yes since last visit: Has Dressing in Place as Prescribed: Yes Has Compression in Place as Prescribed: Yes Pain Present Now: Yes Electronic Signature(s) Signed: 01/12/2022 6:12:58 PM By: Baruch Gouty RN, BSN Entered By: Baruch Gouty on 01/12/2022 11:12:58 -------------------------------------------------------------------------------- Compression Therapy Details Patient Name: Date of Service: [name]. 01/12/2022 10:30 A M Medical Record Number: [id number] Patient Account Number: [id number] Date of Birth/Sex: Treating RN: February 10, 1951 (71 y.o. [name] Primary Care Zeynab Klett: [name] Other Clinician: Referring Talal Fritchman: Treating Lamine Laton/Extender: [name] in Treatment: 39 Compression Therapy Performed for Wound Assessment: Wound #22 Left,Lateral Lower Leg Performed  By: Clinician Baruch Gouty, RN Compression Type: Double Layer Post Procedure Diagnosis Same as Pre-procedure Electronic Signature(s) Signed: 01/12/2022 6:12:58 PM By: Baruch Gouty RN, BSN Entered By: Baruch Gouty on 01/12/2022 11:35:46 -------------------------------------------------------------------------------- Encounter Discharge Information Details Patient Name: Date of Service: [name]. 01/12/2022 10:30 A M Medical Record Number: [id number] Patient Account Number: [id number] Date of Birth/Sex: Treating RN: 1950-08-27 (71 y.o. [name] Primary Care Taiya Nutting: [name] Other Clinician: Referring Jissel Slavens: Treating Laresha Bacorn/Extender: [name] in Treatment: 39 Encounter Discharge Information Items Post Procedure Vitals Discharge Condition: Stable Temperature (F): 97.6 Ambulatory Status: Ambulatory Pulse (bpm): 65 Discharge Destination: Home Respiratory Rate (breaths/min): 18 Transportation: Private Auto Blood Pressure (mmHg): 168/92 Accompanied By: self Schedule Follow-up Appointment: Yes Clinical Summary of Care: Patient Declined Electronic Signature(s) Signed: 01/12/2022 6:12:58 PM By: Baruch Gouty RN, BSN Entered By: Baruch Gouty on 01/12/2022 11:53:41 -------------------------------------------------------------------------------- Lower Extremity Assessment Details Patient Name: Date of Service: [name]. 01/12/2022 10:30 A M Medical Record Number: [id number] Patient Account Number: [id number] Date of Birth/Sex: Treating RN: 1950-11-20 (71 y.o. [name] Primary Care Janis Sol: [name] Other Clinician: Referring Paris Chiriboga: Treating Damarri Rampy/Extender: [name] in Treatment: 39 Edema Assessment Assessed: [name]: No] [Right: No] Edema: [Left: Ye] [Right: s] Calf Left: Right: Point of Measurement: 41 cm From Medial Instep 47.5 cm Ankle Left: Right: Point  of Measurement: 10 cm From Medial Instep 27.5 cm Vascular Assessment Pulses: Dorsalis Pedis Palpable: [Left:Yes] Electronic Signature(s) Signed: 01/12/2022 6:12:58 PM By: Baruch Gouty RN, BSN Entered By: Baruch Gouty on 01/12/2022 11:27:45 -------------------------------------------------------------------------------- Multi Wound Chart Details Patient Name: Date of Service: [name]. 01/12/2022 10:30 A M Medical Record Number: [id number] Patient Account Number: [id number] Date of Birth/Sex: Treating RN: 07-Nov-1950 (71 y.o. [name] Primary Care Julliana Whitmyer: [name] Other Clinician: Referring Kenzley Ke: Treating Neyda Durango/Extender: [name] in Treatment: 39 Vital Signs Height(in): 74 Capillary Blood Glucose(mg/dl): 284 Weight(lbs): 238  Pulse(bpm): 65 Body Mass Index(BMI): 30.6 Blood Pressure(mmHg): 168/92 Temperature(F): 97.6 Respiratory Rate(breaths/min): 18 Photos: [N/A:N/A] Left, Lateral Lower Leg N/A N/A Wound Location: Bump N/A N/A Wounding Event: Cyst N/A N/A Primary Etiology: Glaucoma, Sleep Apnea, N/A N/A Comorbid History: Hypertension, Peripheral Arterial Disease, Peripheral Venous Disease, Type II Diabetes, Gout, Osteoarthritis, Neuropathy 06/03/2021 N/A N/A Date Acquired: 19 N/A N/A Weeks of Treatment: Open N/A N/A Wound Status: No N/A N/A Wound Recurrence: 9x1.9x0.1 N/A N/A Measurements L x W x D (cm) 13.43 N/A N/A A (cm) : rea 1.343 N/A N/A Volume (cm) : -714.40% N/A N/A % Reduction in A rea: -1.80% N/A N/A % Reduction in Volume: Full Thickness With Exposed Support N/A N/A Classification: Structures Medium N/A N/A Exudate A mount: Serosanguineous N/A N/A Exudate Type: red, brown N/A N/A Exudate Color: Fibrotic scar, thickened scar N/A N/A Wound Margin: Large (67-100%) N/A N/A Granulation A mount: Red N/A N/A Granulation Quality: Small (1-33%) N/A N/A Necrotic A mount: Fat  Layer (Subcutaneous Tissue): Yes N/A N/A Exposed Structures: Fascia: No Tendon: No Muscle: No Joint: No Bone: No Small (1-33%) N/A N/A Epithelialization: Debridement - Selective/Open Wound N/A N/A Debridement: Pre-procedure Verification/Time Out 11:35 N/A N/A Taken: Slough N/A N/A Tissue Debrided: Non-Viable Tissue N/A N/A Level: 17.1 N/A N/A Debridement A (sq cm): rea Curette N/A N/A Instrument: Minimum N/A N/A Bleeding: Pressure N/A N/A Hemostasis A chieved: 0 N/A N/A Procedural Pain: 0 N/A N/A Post Procedural Pain: Procedure was tolerated well N/A N/A Debridement Treatment Response: 9x1.9x0.1 N/A N/A Post Debridement Measurements L x W x D (cm) 1.343 N/A N/A Post Debridement Volume: (cm) Compression Therapy N/A N/A Procedures Performed: Debridement Treatment Notes Electronic Signature(s) Signed: 01/12/2022 11:41:01 AM By: Fredirick Maudlin MD FACS Signed: 01/12/2022 6:12:58 PM By: Baruch Gouty RN, BSN Entered By: Fredirick Maudlin on 01/12/2022 11:41:01 -------------------------------------------------------------------------------- Multi-Disciplinary Care Plan Details Patient Name: Date of Service: [name]. 01/12/2022 10:30 A M Medical Record Number: [id number] Patient Account Number: [id number] Date of Birth/Sex: Treating RN: [birth year] (71 y.o. [name] Primary Care Rayvion Stumph: [name] Other Clinician: Referring Albie Bazin: Treating Novak Stgermaine/Extender: [name] in Treatment: 39 Multidisciplinary Care Plan reviewed with physician Active Inactive Venous Leg Ulcer Nursing Diagnoses: Knowledge deficit related to disease process and management Potential for venous Insuffiency (use before diagnosis confirmed) Goals: Patient will maintain optimal edema control Date Initiated: 07/27/2021 Target Resolution Date: 01/17/2022 Goal Status: Active Interventions: Assess peripheral edema status every  visit. Treatment Activities: Therapeutic compression applied : 07/27/2021 Notes: Wound/Skin Impairment Nursing Diagnoses: Impaired tissue integrity Knowledge deficit related to ulceration/compromised skin integrity Goals: Patient will have a decrease in wound volume by X% from date: (specify in notes) Date Initiated: 04/12/2021 Date Inactivated: 01/04/2022 Target Resolution Date: 04/23/2021 Goal Status: Met Patient/caregiver will verbalize understanding of skin care regimen Date Initiated: 01/04/2022 Target Resolution Date: 02/01/2022 Goal Status: Active Ulcer/skin breakdown will have a volume reduction of 30% by week 4 Date Initiated: 04/12/2021 Date Inactivated: 04/27/2021 Target Resolution Date: 04/27/2021 Goal Status: Unmet Unmet Reason: infection Ulcer/skin breakdown will have a volume reduction of 50% by week 8 Date Initiated: 04/27/2021 Date Inactivated: 06/29/2021 Target Resolution Date: 06/24/2021 Goal Status: Met Interventions: Assess patient/caregiver ability to obtain necessary supplies Assess patient/caregiver ability to perform ulcer/skin care regimen upon admission and as needed Assess ulceration(s) every visit Notes: Electronic Signature(s) Signed: 01/12/2022 6:12:58 PM By: Baruch Gouty RN, BSN Entered By: Baruch Gouty on 01/12/2022 11:31:51 -------------------------------------------------------------------------------- Pain Assessment Details Patient Name: Date of Service: [name] Royalty  W. 01/12/2022 10:30 A M Medical Record Number: [id number] Patient Account Number: [id number] Date of Birth/Sex: Treating RN: [birth year] (71 y.o. [name] Primary Care Jermesha Sottile: [name] Other Clinician: Referring Ranetta Armacost: Treating Giulianna Rocha/Extender: [name] in Treatment: 39 Active Problems Location of Pain Severity and Description of Pain Patient Has Paino Yes Site Locations Pain Location: Generalized Pain With Dressing  Change: No Duration of the Pain. Constant / Intermittento Intermittent Rate the pain. Current Pain Level: 2 Character of Pain Describe the Pain: Other: sore Pain Management and Medication Current Pain Management: Notes reports soreness on bottom of feet from increased walking the past few days Electronic Signature(s) Signed: 01/12/2022 6:12:58 PM By: Baruch Gouty RN, BSN Entered By: Baruch Gouty on 01/12/2022 11:14:02 -------------------------------------------------------------------------------- Patient/Caregiver Education Details Patient Name: Date of Service: [name] 7/21/2023andnbsp10:30 Jennette Record Number: [id number] Patient Account Number: [id number] Date of Birth/Gender: Treating RN: Nov 08, 1950 (71 y.o. [name] Primary Care Physician: [name] Other Clinician: Referring Physician: Treating Physician/Extender: [name] in Treatment: 63 Education Assessment Education Provided To: Patient Education Topics Provided Venous: Methods: Explain/Verbal Responses: Reinforcements needed, State content correctly Wound/Skin Impairment: Methods: Explain/Verbal Responses: Reinforcements needed, State content correctly Electronic Signature(s) Signed: 01/12/2022 6:12:58 PM By: Baruch Gouty RN, BSN Entered By: Baruch Gouty on 01/12/2022 11:32:30 -------------------------------------------------------------------------------- Wound Assessment Details Patient Name: Date of Service: [name]. 01/12/2022 10:30 A M Medical Record Number: [id number] Patient Account Number: [id number] Date of Birth/Sex: Treating RN: [birth year] (71 y.o. [name] Primary Care Jamarkus Lisbon: [name] Other Clinician: Referring Randy Whitener: Treating Payslee Bateson/Extender: [name] in Treatment: 39 Wound Status Wound Number: 22 Primary Cyst Etiology: Wound Location: Left, Lateral Lower Leg Wound  Open Wounding Event: Bump Status: Date Acquired: 06/03/2021 Comorbid Glaucoma, Sleep Apnea, Hypertension, Peripheral Arterial Disease, Weeks Of Treatment: 31 History: Peripheral Venous Disease, Type II Diabetes, Gout, Osteoarthritis, Clustered Wound: No Neuropathy Photos Wound Measurements Length: (cm) 9 Width: (cm) 1.9 Depth: (cm) 0.1 Area: (cm) 13.43 Volume: (cm) 1.343 % Reduction in Area: -714.4% % Reduction in Volume: -1.8% Epithelialization: Small (1-33%) Tunneling: No Undermining: No Wound Description Classification: Full Thickness With Exposed Support Structures Wound Margin: Fibrotic scar, thickened scar Exudate Amount: Medium Exudate Type: Serosanguineous Exudate Color: red, brown Foul Odor After Cleansing: No Slough/Fibrino Yes Wound Bed Granulation Amount: Large (67-100%) Exposed Structure Granulation Quality: Red Fascia Exposed: No Necrotic Amount: Small (1-33%) Fat Layer (Subcutaneous Tissue) Exposed: Yes Necrotic Quality: Adherent Slough Tendon Exposed: No Muscle Exposed: No Joint Exposed: No Bone Exposed: No Treatment Notes Wound #22 (Lower Leg) Wound Laterality: Left, Lateral Cleanser Soap and Water Discharge Instruction: May shower and wash wound with dial antibacterial soap and water prior to dressing change. Wound Cleanser Discharge Instruction: Cleanse the wound with wound cleanser prior to applying a clean dressing using gauze sponges, not tissue or cotton balls. Peri-Wound Care Triamcinolone 15 (g) Discharge Instruction: Use triamcinolone 15 (g) as directed Sween Lotion (Moisturizing lotion) Discharge Instruction: Apply moisturizing lotion as directed Topical Primary Dressing Promogran Prisma Matrix, 4.34 (sq in) (silver collagen) Discharge Instruction: Moisten collagen with saline or hydrogel Secondary Dressing Zetuvit Plus Silicone Border Dressing 4x4 (in/in) Discharge Instruction: Apply silicone border over primary dressing as  directed. Secured With Compression Wrap CoFlex TLC XL 2-layer Compression System 4x7 (in/yd) Discharge Instruction: Apply CoFlex 2-layer compression as directed. (alt for 4 layer) Compression Stockings Add-Ons Electronic Signature(s) Signed: 01/12/2022 6:12:58 PM By: Baruch Gouty RN, BSN Entered By: Johna Roles,  Linda on 01/12/2022 11:30:47 -------------------------------------------------------------------------------- Vitals Details Patient Name: Date of Service: [name], [name] 01/12/2022 10:30 A M Medical Record Number: [id number] Patient Account Number: [id number] Date of Birth/Sex: Treating RN: [birth year] (71 y.o. [name] Primary Care Lauralynn Loeb: [name] Other Clinician: Referring Erasto Sleight: Treating Karema Tocci/Extender: [name] in Treatment: 39 Vital Signs Time Taken: 11:16 Temperature (F): 97.6 Height (in): 74 Pulse (bpm): 65 Weight (lbs): 238 Respiratory Rate (breaths/min): 18 Body Mass Index (BMI): 30.6 Blood Pressure (mmHg): 168/92 Capillary Blood Glucose (mg/dl): 284 Reference Range: 80 - 120 mg / dl Notes glucose per pt's meter at present Electronic Signature(s) Signed: 01/12/2022 6:12:58 PM By: Baruch Gouty RN, BSN Entered By: Baruch Gouty on 01/12/2022 11:17:20

## 2022-01-12 NOTE — Progress Notes (Signed)
SHIVAN, HODES ([id number]) Visit Report for 01/12/2022 Chief Complaint Document Details Patient Name: Date of Service: DELVON, CHIPPS 01/12/2022 10:30 A M Medical Record Number: [id number] Patient Account Number: [id number] Date of Birth/Sex: Treating RN: Sep 02, 1950 (71 y.o. [name] Primary Care Provider: [name] Other Clinician: Referring Provider: Treating Provider/Extender: [name] in Treatment: 39 Information Obtained from: Patient Chief Complaint Left leg and foot ulcers 04/12/2021; patient is here for wounds on his left lower leg and left plantar foot over the first metatarsal head Electronic Signature(s) Signed: 01/12/2022 11:46:22 AM By: Fredirick Maudlin MD FACS Entered By: Fredirick Maudlin on 01/12/2022 11:46:22 -------------------------------------------------------------------------------- Debridement Details Patient Name: Date of Service: [name] 01/12/2022 10:30 A M Medical Record Number: [id number] Patient Account Number: [id number] Date of Birth/Sex: Treating RN: Feb 18, 1951 (71 y.o. [name] Primary Care Provider: [name] Other Clinician: Referring Provider: Treating Provider/Extender: [name] in Treatment: 39 Debridement Performed for Assessment: Wound #22 Left,Lateral Lower Leg Performed By: Physician Fredirick Maudlin, MD Debridement Type: Debridement Level of Consciousness (Pre-procedure): Awake and Alert Pre-procedure Verification/Time Out Yes - 11:35 Taken: Start Time: 11:35 T Area Debrided (L x W): otal 9 (cm) x 1.9 (cm) = 17.1 (cm) Tissue and other material debrided: Non-Viable, Slough, Biofilm, Slough Level: Non-Viable Tissue Debridement Description: Selective/Open Wound Instrument: Curette Bleeding: Minimum Hemostasis Achieved: Pressure Procedural Pain: 0 Post Procedural Pain: 0 Response to Treatment: Procedure was tolerated well Level of Consciousness  (Post- Awake and Alert procedure): Post Debridement Measurements of Total Wound Length: (cm) 9 Width: (cm) 1.9 Depth: (cm) 0.1 Volume: (cm) 1.343 Character of Wound/Ulcer Post Debridement: Improved Post Procedure Diagnosis Same as Pre-procedure Electronic Signature(s) Signed: 01/12/2022 2:58:58 PM By: Fredirick Maudlin MD FACS Signed: 01/12/2022 6:12:58 PM By: Baruch Gouty RN, BSN Entered By: Baruch Gouty on 01/12/2022 11:37:21 -------------------------------------------------------------------------------- HPI Details Patient Name: Date of Service: [name] 01/12/2022 10:30 A M Medical Record Number: [id number] Patient Account Number: [id number] Date of Birth/Sex: Treating RN: January 21, 1951 (71 y.o. [name] Primary Care Provider: [name] Other Clinician: Referring Provider: Treating Provider/Extender: [name] in Treatment: 39 History of Present Illness HPI Description: 10/11/17; Mr. Kirsch is a 71 year old man who tells me that in 2015 he slipped down the latter traumatizing his left leg. He developed a wound in the same spot the area that we are currently looking at. He states this closed over for the most part although he always felt it was somewhat unstable. In 2016 he hit the same area with the door of his car had this reopened. He tells me that this is never really closed although sometimes an inflow it remains open on a constant basis. He has not been using any specific dressing to this except for topical antibiotics the nature of which were not really sure. His primary doctor did send him to see Dr. [name] of interventional cardiology. He underwent an angiogram on 08/06/17 and he underwent a PTA and directional atherectomy of the lesser distal SFA and popliteal arteries which resulted in brisk improvement in blood flow. It was noted that he had 2 vessel runoff through the anterior tibial and peroneal. He is also been to see  vascular and interventional radiologist. He was not felt to have any significant superficial venous insufficiency. Presumably is not a candidate for any ablation. It was suggested he come here for wound care. The patient is a type II diabetic on insulin. He also  has a history of venous insufficiency. ABIs on the left were noncompressible in our clinic 10/21/17; patient we admitted to the clinic last week. 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 We put Iodosorb on him after an aggressive debridement and 3 layer compression. He complained of pain in his ankle and itching with is skin in fact he scratched the area on the medial calf superiorly at the rim of our wraps and he has 2 small open areas in that location today which are new. I changed his primary dressing today to silver collagen. As noted he is already had revascularization and does not have any significant superficial venous insufficiency that would be amenable to ablation 10/28/17; patient admitted to the clinic 2 weeks ago. He has a smaller Wound. Scratch injury from last week revealed. There is large wound over the tibial area. This is smaller. Granulation looks healthy. No need for debridement. 11/04/17; the wound on the left lateral calf looks better. Improved dimensions. Surface of this looks better. We've been maintaining him and Kerlix Coban wraps. He finds this much more comfortable. Silver collagen dressing 11/11/17; left lateral Wound continues to look healthy be making progress. Using a #5 curet I removed removed nonviable skin from the surface of the wound and then necrotic debris from the wound surface. Surface of the wound continues to look healthy. He also has an open area on the left great toenail bed. We've been using topical antibiotics. 11/19/17; left anterior lateral wound continues to look healthy but it's not closed. He also had a small wound above this on  the left leg Initially traumatic wounds in the setting of significant chronic venous insufficiency and stasis dermatitis 11/25/17; left anterior wounds superiorly is closed still a small wound inferiorly. 12/02/17; left anterior tibial area. Arrives today with adherent callus. Post debridement clearly not completely closed. Hydrofera Blue under 3 layer compression. 12/09/17; left anterior tibia. Circumferential eschar however the wound bed looks stable to improved. We've been using Hydrofera Blue under 3 layer compression 12/17/17; left anterior tibia. Apparently this was felt to be closed however when the wrap was taken off there is a skin tear to reopen wounds in the same area we've been using Hydrofera Blue under 3 layer compression 12/23/17 left anterior tibia. Not close to close this week apparently the [facility] was stuck to this again. Still circumferential eschar requiring debridement. I put a contact layer on this this time under the Hydrofera Blue 12/31/17; left anterior tibia. Wound is better slight amount of hyper-granulation. Using Hydrofera Blue over Adaptic. 01/07/18; left anterior tibia. The wound had some surface eschar however after this was removed he has no open wound.he was already revascularized by Dr. [name] when he came to our clinic with atherectomy of the left SFA and popliteal artery. He was also sent to interventional radiology for venous reflux studies. He was not felt to have significant reflux but certainly has chronic venous changes of his skin with hemosiderin deposition around this area. He will definitely need to lubricate his skin and wear compression stocking and I've talked to him about this. READMISSION 05/26/2018 This is a now 71 year old man we cared for with traumatic wounds on his left anterior lower extremity. He had been previously revascularized during that admission by Dr. [name]. Apparently in follow-up Dr. [name] noted that he had deterioration in his  arterial status. He underwent a stent placement in the distal left SFA  on 04/22/2018. 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 He went back to the angiography suite on 04/30/2018 he underwent PTA and balloon angioplasty of the occluded left mid anterior tibial artery, thrombotic occlusion went from 100 to 0% which reconstitutes the posterior tibial artery. He had thrombectomy and aspiration of the peroneal artery. The stent placed in the distal SFA left SFA was still occluded. 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 He has been recuperating at [place] [facility] in [facility] after the most recent hospitalization. He is going home tomorrow. He tells me that 2 weeks ago he traumatized the tip of his left fifth toe. He came in urgently for our review of this. This was a history of before I noted that Dr. [name] had already noted dry gangrenous changes of the left fifth toe 06/09/2018; 2-week follow-up. I did contact Dr. [name] after his last appointment and he apparently saw 1 of Dr. Irven Shelling colleagues the next day. He does not follow-up with Dr. [name] himself until Thursday of this week. 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 Nevertheless there is no evidence of infection no drainage and no pain. He had a new area that this week when we were signing him in today on the left anterior mid tibia area, this is in close proximity to the previous wound we have dealt with in this clinic. 06/23/2018; 2-week follow-up. I did not receive a recent note from Dr. [name] to review today. Our office is trying to obtain this. He is apparently not planning to do further vascular interventions and wondered about compression to try  and help with the patient's chronic venous insufficiency. However we are also concerned about the arterial flow. He arrives in clinic today with a new area on the left third toe. The areas on the calf/anterior tibia are close to closing. The left fifth toe is still mummified using Betadine. -In reviewing things with the patient he has what sounds like claudication with mild to moderate amount of activity. 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 I prescribed Levaquin over the phone while we attempted to arrange a plan of care. However the patient called yesterday to report he had low-grade fever and he came in today acutely. There is been a marked deterioration in the left third toe with spreading cellulitis up into the dorsal left foot. He was referred to the emergency room. Readmission: 06/29/2020 patient presents today for reevaluation here in our clinic he was previously treated by Dr. Dellia Nims at the latter part of 2019 in 2 the beginning of 2020. Subsequently we have not seen him since that time in the interim he did have evaluation with vein and vascular specialist specifically Dr. Anice Paganini who did perform quite extensive work for a left femoral to anterior tibial artery bypass. With that being said in the interim the patient has developed significant lymphedema and has wounds that he tells me have really never healed in regard to the incision site on the left leg. He also has multiple wounds on the feet for various reasons some of which is that he tends to pick at his feet. Fortunately there is no signs of  active infection systemically at this time he does have some wounds that are little bit deeper but most are fairly superficial he seems to have good blood flow and overall everything appears to be healthy I see no bone exposed and no obvious signs of osteomyelitis. I do not know that he necessarily needs a x-ray at this point although that something we could  consider depending on how things progress. The patient does have a history of lymphedema, diabetes, this is type II, chronic kidney disease stage III, hypertension, and history of peripheral vascular disease. 07/05/2020; patient admitted last week. Is a patient I remember from 2019 he had a spreading infection involving the left foot and we sent him to the hospital. 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 He subsequently had a left femoral to anterior tibial bypass by Dr.Cain vein and vascular. He also has severe lymphedema with chronic skin changes related to that on the left leg. The most problematic area that was new today was on the left medial great toe. This was apparently a small area last week there was purulent drainage which our intake nurse cultured. Also areas on the left medial foot and heel left lateral foot. He has 2 areas on the left medial calf left lateral calf in the setting of the severe lymphedema. 07/13/2020 on evaluation today patient appears to be doing better in my opinion compared to his last visit. The good news is there is no signs of active infection systemically and locally I do not see any signs of infection either. He did have an x-ray which was negative that is great news he had a culture which showed MRSA but at the same time he is been on the doxycycline which has helped. I do think we may want to extend this for 7 additional days 1/25; patient admitted to the clinic a few weeks ago. 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 We have been putting him under compression his edema control is a lot better but he is severe verricused skin on the left leg. He is really done quite well he still has an open area on the left medial calf and the left medial first metatarsal head. We have been using silver collagen on the leg silver alginate on the foot 07/27/2020 upon evaluation today patient  appears to be doing decently well in regard to his wounds. He still has a lot of dry skin on the left leg. Some of this is starting to peel back and I think he may be able to have them out by removing some that today. Fortunately there is no signs of active infection at this time on the left leg although on the right leg he does appear to have swelling and erythema as well as some mild warmth to touch. This does have been concerned about the possibility of cellulitis although within the differential diagnosis I do think that potentially a DVT has to be at least considered. We need to rule that out before proceeding would just call in the cellulitis. Especially since he is having pain in the posterior aspect of his calf muscle. 2/8; the patient had seen sparingly. He has severe skin changes of chronic lymphedema in the left leg thickened hyperkeratotic verrucous skin. He has an open wound on the medial part of the left first met head left mid tibia. He also has a rim of nonepithelialized skin  in the anterior mid tibia. He brought in the AmLactin lotion that was been prescribed although I am not sure under compression and its utility. There concern about cellulitis on the right lower leg the last time he was here. He was put on on antibiotics. His DVT rule out was negative. The right leg looks fine he is using his stocking on this area 08/10/2020 upon evaluation today patient appears to be doing well with regard to his leg currently. He has been tolerating the dressing changes without complication. Fortunately there is no signs of active infection which is great news. Overall very pleased with where things stand. 2/22; the patient still has an area on the medial part of the left first met his head. This looks better than when I last saw this earlier this month he has a rim of epithelialization but still some surface debris. Mostly everything on the left leg is healed. There is still a vulnerable in the  left mid tibia area. 08/30/2020 upon evaluation today patient appears to be doing much better in regard to his wounds on his foot. 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 Nonetheless I think we will address that today I Minna send in a prescription for him in that regard. Overall though there does not appear to be any signs of significant worsening. 09/07/2020 on evaluation today patient's wounds over his left foot appear to be doing excellent. I do not see any signs of infection there is some callus buildup this can require debridement for certain but overall I feel like he is managing quite nicely. He still using the AmLactin cream which has been beneficial for him as well. 3/22; left foot wound is closed. There is no open area here. He is using ammonium lactate lotion to the lower extremities to help exfoliate dry cracked skin. He has compression stockings from elastic therapy in [place]. The wound on the medial part of his left first met head is healed today. READMISSION 04/12/2021 Mr. Roskelley is a patient we know fairly well he had a prolonged stay in clinic in 2019 with wounds on his left lateral and left anterior lower extremity in the setting of chronic venous insufficiency. More recently he was here earlier this year with predominantly an area on his left foot first metatarsal head plantar and he says the plantar foot broke down on its not long after we discharged him but he did not come back here. The last few months areas of broken down on his left anterior and again the left lateral lower extremity. The leg itself is very swollen chronically enlarged a lot of hyperkeratotic dry Berry Q skin in the left lower leg. His edema extends well into the thigh. He was seen by Dr. Donzetta Matters. He had ABIs on 03/02/2021 showing an ABI on the right of 1 with a TBI of 0.72 his  ABI in the left at 1.09 TBI of 0.99. Monophasic and biphasic waveforms on the right. On the left monophasic waveforms were noted he went on to have an angiogram on 03/27/2021 this showed the aortic aortic and iliac segments were free of flow-limiting stenosis the left common femoral vein to evaluate the left femoral to anterior tibial artery bypass was unobstructed the bypass was patent without any areas of stenosis. We discharged the patient in bilateral juxta lite stockings but very clearly that  was not sufficient to control the swelling and maintain skin integrity. He is clearly going to need compression pumps. 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 This is fortunate because he is on his feet for long periods of time. 10/27; patient comes in with our intake nurse reporting copious amount of green drainage from the left anterior mid tibia the left dorsal foot and to a lesser extent the left medial mid tibia. We left the compression wrap on all week for the amount of edema in his left leg is quite a bit better. We use silver alginate as the primary dressing 11/3; edema control is good. Left anterior lower leg left medial lower leg and the plantar first metatarsal head. The left anterior lower leg required debridement. Deep tissue culture I did of this wound showed MRSA I put him on 10 days of doxycycline which she will start today. We have him in compression wraps. He has a security card and [employer] however he is retiring on November 15. We will need to then get him into a better offloading boot for the left foot perhaps a total contact cast 11/10; edema control is quite good. Left anterior and left medial lower leg wounds in the setting of chronic venous insufficiency and lymphedema. He also has a substantial area over the left plantar first metatarsal head. I treated him for MRSA that we identified on the major wound on the left anterior mid  tibia with doxycycline and gentamicin topically. He has significant hypergranulation on the left plantar foot wound. The patient is a diabetic but he does not have significant PAD 11/17; edema control is quite good. Left anterior and left medial lower leg wounds look better. The really concerning area remains the area on the left plantar first metatarsal head. He has a rim of epithelialization. 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 Starting today with a forefoot off loader but . possibly a total contact cast. 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 Arrives with a very significant odor from I think all of the wounds on the left leg and the left foot. Because of this I did not put a total contact cast on him today but will could still consider this. His wife was having cataract surgery which is the reason he missed the appointment 12/6. I saw this man 5 days ago with a swelling below the popliteal fossa. I thought he actually might have a Baker's cyst however the DVT rule out study that we could arrange right away was negative the technician told me this was not a ruptured Baker's cyst. We attempted to get this aspirated by under ultrasound guidance in interventional radiology however all they did was an ultrasound however it shows an extensive fluid collection 62 x 8 x 9.4 in the left thigh and left calf. The patient states he thinks this started 8 days ago or so but he really is not complaining of any pain, fever or systemic symptoms. He has not ha 12/20; after some difficulty I managed to get the patient into see Dr. Donzetta Matters. Eventually he was taken into the hospital and had a drain put  in the fluid collection below his left knee posteriorly extending into the posterior  thigh. He still has the drain in place. Culture of this showed moderate staff aureus few Morganella and few Klebsiella he is now on doxycycline and ciprofloxacin as suggested by infectious disease he is on this for a month. The drain will remain in place until it stops draining 12/29; he comes in today with the 1 wound on his left leg and the area on the left plantar first met head significantly smaller. Both look healthy. He still has the drain in the left leg. He says he has to change this daily. Follows up with Dr. Donzetta Matters on January 11. 06/29/2021; the wounds that I am following on the left leg and left first met head continued to be quite healthy. However the area where his inferior drain is in place had copious amounts of drainage which was green in color. The wound here is larger. Follows up with Dr. Gwenlyn Saran of vein and vascular his surgeon next week as well as infectious disease. He remains on ciprofloxacin and doxycycline. He is not complaining of excessive pain in either one of the drain areas 1/12; the patient saw vascular surgery and infectious disease. Vascular surgery has left the drain in place as there was still some notable drainage still see him back in 2 weeks. Dr. Velna Ochs stop the doxycycline and ciprofloxacin and I do not believe he follows up with them at this point. Culture I did last week showed both doxycycline resistant MRSA and Pseudomonas not sensitive to ciprofloxacin although only in rare titers 1/19; the patient's wound on the left anterior lower leg is just about healed. We have continued healing of the area that was medially on the left leg. Left first plantar metatarsal head continues to get smaller. The major problem here is his 2 drain sites 1 on the left upper calf and lateral thigh. There is purulent drainage still from the left lateral thigh. I gave him antibiotics last week but we still have recultured. He has the drain in the area I think this is eventually going to have  to come out. I suspect there will be a connecting wound to heal here perhaps with improved VAc 1/26; the patient had his drain removed by vein and vascular on 1/25/. This was a large pocket of fluid in his left thigh that seem to tunnel into his left upper calf. 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 His mention his Penrose drain was removed today. He now has a tunneling wound on his left calf and left thigh. Both of these probe widely towards each other although I cannot really prove that they connect. Both wounds on his lower leg anteriorly are closed and his area over the first metatarsal head on his right foot continues to improve. We are using Hydrofera Blue here. He also saw infectious disease culture of the abscess they noted was polymicrobial with MRSA, Morganella and Klebsiella he was treated with doxycycline and ciprofloxacin for 4 weeks ending on 07/03/2021. They did not recommend any further antibiotics. Notable that while he still had the Penrose drain in place last week he had purulent drainage coming out of the inferior IandD site this grew [facility], MRSA and Pseudomonas but there does not appear to be any active infection in this area today with the drain out and he is not systemically unwell 2/2; with regards to the drain sites the superior one on the thigh actually  is closed down the one on the upper left lateral calf measures about 8 and half centimeters which is an improvement seems to be less prominent although still with a lot of drainage. The only remaining wound is over the first metatarsal head on the left foot and this looks to be continuing to improve with Hydrofera Blue. 2/9; the area on his plantar left foot continues to contract. Callus around the wound edge. The drain sites specifically have not come down in depth. We put the wound VAC on Monday he changed the canister late last night our intake nurse reported a pocket of fluid perhaps caused by  our compression wraps 2/16; continued improvement in left foot plantar wound. drainage site in the calf is not improved in terms of depth (wound vac) 2/23; continued improvement in the left foot wound over the first metatarsal head. With regards to the drain sites the area on his thigh laterally is healed however the open area on his calf is small in terms of circumference by still probes in by about 15 cm. Within using the wound VAC. Hydrofera Blue on his foot 08/24/2021: The left first metatarsal head wound continues to improve. The wound bed is healthy with just some surrounding callus. Unfortunately the open drain site on his calf remains open and tunnels at least 15 cm (the extent of a Q-tip). This is despite several weeks of wound VAC treatment. Based on reading back through the notes, there has been really no significant change in the depth of the wound, although the orifice is smaller and the more cranial wound on his thigh has closed. I suspect the tunnel tracks nearly all the way to this location. 08/31/2021: Continued improvement in the left first metatarsal head wound. There has been absolutely no improvement to the long tunnel from his open drain site on his calf. We have tried to get him into see vascular surgery sooner to consider the possibility of simply filleting the tract open and allowing it to heal from the bottom up, likely with a wound VAC. They have not yet scheduled a sooner appointment than his current mid April 09/14/2021: He was seen by vascular surgery and they took him to the operating room last week. They opened a portion of the tunnel, but did not extend the entire length of the known open subcutaneous tract. I read Dr. Claretha Cooper operative note and it is not clear from that documentation why only a portion of the tract was opened. The heaped up granulation tissue was curetted and removed from at least some portion of the tract. They did place a wound VAC and applied an Unna  boot to the leg. The ulcer on his left first metatarsal head is smaller today. The bed looks good and there is just a small amount of surrounding callus. 09/21/2021: The ulcer on his left first metatarsal head looks to be stalled. There is some callus surrounding the wound but the wound bed itself does not appear particularly dynamic. The tunnel tract on his lateral left leg seems to be roughly the same length or perhaps slightly smaller but the wound bed appears healthy with good granulation tissue. He opened up a new wound on his medial thigh and the site of a prior surgical incision. He says that he did this unconsciously in his sleep by scratching. 09/28/2021: Unfortunately, the ulcer on his left first metatarsal head has extended underneath the callus toward the dorsum of his foot. The medial thigh wounds are roughly the  same. The tunnel on his lateral left leg continues to be problematic; it is longer than we are able to actually probe with a Q-tip. I am still not certain as to why Dr. Donzetta Matters did not open this up entirely when he took the patient to the operating room. We will likely be back in the same situation with just a small superficial opening in a long unhealed tract, as the open portion is granulating in nicely. 10/02/2021: The patient was initially scheduled for a nurse visit, but we are also applying a total contact cast today. The plantar foot wound looks clean without significant accumulated callus. We have been applying Prisma silver collagen to the site. 10/05/2021: The patient is here for his first total contact cast change. We have tried using gauze packing strips in the tunnel on his lateral leg wound, but this does not seem to be working any better than the white VAC foam. The foot ulcer looks about the same with minimal periwound callus. Medial thigh wound is clean with just some overlying eschar. 10/12/2021: The plantar foot wound is stable without any significant accumulation of  periwound callus. The surface is viable with good granulation tissue. The medial thigh wounds are much smaller and are epithelializing. On the other hand, he had purulent drainage coming from the tunnel on his lateral leg. He does go back to see Dr. Donzetta Matters next week and is planning to ask him why the wound tunnel was not completely opened at the time of his most recent operation. 10/19/2021: The plantar foot wound is markedly improved and has epithelial tissue coming through the surface. The medial thigh wounds are nearly closed with just a tiny open area. He did see Dr. Donzetta Matters earlier this week and apparently they did discuss the possibility of opening the sinus tract further and enabling a wound VAC application. Apparently there are some limits as to what Dr. 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 I think if we could get the tract open to the level of the popliteal fossa, this would greatly aid in her ability to get this chart closed. That being said, however, today when I probed the tract with a Q-tip, I was not able to insert the entirety of the Q-tip as I have on previous occasions. The tunnel is shorter by about 4 cm. The surface is clean with good granulation tissue and no further episodes of purulent drainage. 10/30/2021: Last week, the patient underwent surgery and had the long tract in his leg opened. There was a rind that was debrided, according to the operative report. His medial thigh ulcers are closed. The plantar foot wound is clean with a good surface and some built up surrounding callus. 11/06/2021: The overall dimensions of the large wound on his lateral leg remain about the same, but there is good granulation tissue present and the tunneling is a little bit shorter. He has a new wound on his anterior tibial surface, in the same location where he had a similar lesion in the past. The plantar foot wound is clean with some buildup surrounding callus. Just  toward the medial aspect of his foot, however, there is an area of darkening that once debrided, revealed another opening in the skin surface. 11/13/2021: The anterior tibial surface wound is closed. The plantar foot wound has some surrounding callus buildup. The area of darkening that I debrided last week and revealed an opening in the skin surface has closed again. The tunnel in  the large wound on his lateral leg has come in by about 3 cm. There is healthy granulation tissue on the entire wound surface. 11/23/2021: The patient was out of town last week and did wet-to-dry dressings on his large wound. 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 Unfortunately, he picked open the wound on his left medial thigh. He says that it was itching and he just could not stop scratching it until it was open again. The wound on his plantar foot is smaller and has not accumulated a tremendous amount of callus. The lateral leg wound is shallower and the tunnel has also decreased in depth. There is just a little bit of slough accumulation on the surface. 11/30/2021: Another portion of his left medial thigh has been opened up. All of these wounds are fairly superficial with just a little bit of slough and eschar accumulation. The wound on his plantar foot is almost closed with just a bit of eschar and periwound callus accumulation. The lateral leg wound is nearly flush with the surrounding skin and the tunnel is markedly shallower. 12/07/2021: There is just 1 open area on his left medial thigh. It is clean with just a little bit of perimeter eschar. The wound on his plantar foot continues to contract and just has some eschar and periwound callus accumulation. The lateral leg wound is closing at the more distal aspect and the tunnel is smaller. The surface is nearly flush with the surrounding skin and it has a good bed of granulation tissue. 12/14/2021: The  thigh and foot wounds are closed. The lateral leg wound has closed over approximately half of its length. The tunnel continues to contract and the surface is now flush with the surrounding skin. The wound bed has robust granulation tissue. 12/22/2021: The thigh and foot wounds have reopened. The foot wound has a lot of callus accumulation around and over it. The thigh wound is tiny with just a little bit of slough in the wound bed. The lateral leg wound continues to contract. His vascular surgeon took the wound VAC off earlier in the week and the patient has been doing wet-to-dry dressings. There is a little slough accumulation on the surface. The tunnel is about 3 cm in depth at this point. 12/28/2021: The thigh wound is closed again. The foot wound has some callus that subsequently has peeled back exposing just a small slit of a wound. The lateral leg wound Is down to about half the size that it originally was and the tunnel is down to about half a centimeter in depth. 01/04/2022: The thigh wound remains closed. The foot wound has heavy callus overlying the wound site. Once this was debrided, the wound was found to be closed. The lateral leg wound is smaller again this week and very superficial. No tunnel could be identified. 01/12/2022: The thigh and foot wounds both remain closed. The lateral leg wound is now nearly flush with the skin surface. There is good granulation tissue present with a light layer of slough. Electronic Signature(s) Signed: 01/12/2022 11:47:05 AM By: Fredirick Maudlin MD FACS Entered By: Fredirick Maudlin on 01/12/2022 11:47:05 -------------------------------------------------------------------------------- Physical Exam Details Patient Name: Date of Service: [name] 01/12/2022 10:30 A M Medical Record Number: [id number] Patient Account Number: [id number] Date of Birth/Sex: Treating RN: 10-Nov-1950 (71 y.o. [name] Primary Care Provider: [name] Other  Clinician: Referring Provider: Treating Provider/Extender: Lorina Rabon  Weeks in Treatment: 39 Constitutional Hypertensive, asymptomatic. . . . No acute distress.Marland Kitchen Respiratory Normal work of breathing on room air.. Notes 01/12/2022: The thigh and foot wounds both remain closed. The lateral leg wound is now nearly flush with the skin surface. There is good granulation tissue present with a light layer of slough. Electronic Signature(s) Signed: 01/12/2022 11:47:37 AM By: Fredirick Maudlin MD FACS Entered By: Fredirick Maudlin on 01/12/2022 11:47:37 -------------------------------------------------------------------------------- Physician Orders Details Patient Name: Date of Service: [name] 01/12/2022 10:30 A M Medical Record Number: [id number] Patient Account Number: [id number] Date of Birth/Sex: Treating RN: 1951/02/10 (71 y.o. [name] Primary Care Provider: [name] Other Clinician: Referring Provider: Treating Provider/Extender: [name] in Treatment: 19 Verbal / Phone Orders: No Diagnosis Coding ICD-10 Coding Code Description 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 I89.0 Lymphedema, not elsewhere classified I87.322 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verity Follow-up Appointments ppointment in 1 week. - Dr Celine Ahr - Room 1 Return A Friday 7/28 @ 11:15 am Bathing/ Shower/ Hygiene May shower with protection but do not get wound dressing(s) wet. - Use a cast protector so you can shower without getting your wrap(s) wet Negative Presssure Wound Therapy Wound #22 Left,Lateral Lower Leg Discontinue wound vac Edema Control - Lymphedema / SCD / Other Elevate legs to the level of the heart or above for 30 minutes daily and/or when  sitting, a frequency of: - throughout the day Avoid standing for long periods of time. Patient to wear own compression stockings every day. - on right leg; Moisturize legs daily. - Ammonium lactate to right leg daily Wound Treatment Wound #22 - Lower Leg Wound Laterality: Left, Lateral Cleanser: Soap and Water 1 x Per Week/30 Days Discharge Instructions: May shower and wash wound with dial antibacterial soap and water prior to dressing change. Cleanser: Wound Cleanser 1 x Per Week/30 Days Discharge Instructions: Cleanse the wound with wound cleanser prior to applying a clean dressing using gauze sponges, not tissue or cotton balls. Peri-Wound Care: Triamcinolone 15 (g) 1 x Per Week/30 Days Discharge Instructions: Use triamcinolone 15 (g) as directed Peri-Wound Care: Sween Lotion (Moisturizing lotion) 1 x Per Week/30 Days Discharge Instructions: Apply moisturizing lotion as directed Prim Dressing: Promogran Prisma Matrix, 4.34 (sq in) (silver collagen) 1 x Per Week/30 Days ary Discharge Instructions: Moisten collagen with saline or hydrogel Secondary Dressing: Zetuvit Plus Silicone Border Dressing 4x4 (in/in) 1 x Per Week/30 Days Discharge Instructions: Apply silicone border over primary dressing as directed. Compression Wrap: CoFlex TLC XL 2-layer Compression System 4x7 (in/yd) 1 x Per Week/30 Days Discharge Instructions: Apply CoFlex 2-layer compression as directed. (alt for 4 layer) Electronic Signature(s) Signed: 01/12/2022 2:58:58 PM By: Fredirick Maudlin MD FACS Entered By: Fredirick Maudlin on 01/12/2022 11:48:05 -------------------------------------------------------------------------------- Problem List Details Patient Name: Date of Service: [name] 01/12/2022 10:30 A M Medical Record Number: [id number] Patient Account Number: [id number] Date of Birth/Sex: Treating RN: Oct 29, 1950 (71 y.o. [name] Primary Care Provider: [name] Other Clinician: Referring  Provider: Treating Provider/Extender: [name] in Treatment: 78 Active Problems ICD-10 Encounter Code Description Active Date MDM Diagnosis 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 04/12/2021 No Yes I89.0 Lymphedema, not elsewhere classified 04/12/2021 No Yes I87.322 Chronic venous hypertension (idiopathic) with inflammation of left lower 04/12/2021 No Yes extremity  U44.034 Non-pressure chronic ulcer of other part of left lower leg with other specified 04/12/2021 No Yes severity Inactive Problems ICD-10 Code Description Active Date Inactive Date E11.621 Type 2 diabetes mellitus with foot ulcer 04/12/2021 04/12/2021 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 of other part of left foot with other specified severity 04/12/2021 04/12/2021 E11.42 Type 2 diabetes mellitus with diabetic polyneuropathy 04/12/2021 04/12/2021 L02.416 Cutaneous abscess of left lower limb 06/13/2021 06/13/2021 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 07/20/2021 07/20/2021 Resolved Problems Electronic Signature(s) Signed: 01/12/2022 11:40:53 AM By: Fredirick Maudlin MD FACS Entered By: Fredirick Maudlin on 01/12/2022 11:40:52 -------------------------------------------------------------------------------- Progress Note Details Patient Name: Date of Service: [name] 01/12/2022 10:30 A M Medical Record Number: [id number] Patient Account Number: [id number] Date of Birth/Sex: Treating RN: 1951-06-07 (71 y.o. [name] Primary Care Provider: [name] Other Clinician: Referring Provider: Treating Provider/Extender: [name] in Treatment: 39 Subjective Chief Complaint Information obtained from Patient Left leg and foot ulcers 04/12/2021; patient is here for wounds on his left lower leg and left plantar foot over the first metatarsal head History of Present Illness  (HPI) 10/11/17; Mr. Elpers is a 71 year old man who tells me that in 2015 he slipped down the latter traumatizing his left leg. He developed a wound in the same spot the area that we are currently looking at. He states this closed over for the most part although he always felt it was somewhat unstable. In 2016 he hit the same area with the door of his car had this reopened. He tells me that this is never really closed although sometimes an inflow it remains open on a constant basis. He has not been using any specific dressing to this except for topical antibiotics the nature of which were not really sure. His primary doctor did send him to see Dr. [name] of interventional cardiology. He underwent an angiogram on 08/06/17 and he underwent a PTA and directional atherectomy of the lesser distal SFA and popliteal arteries which resulted in brisk improvement in blood flow. It was noted that he had 2 vessel runoff through the anterior tibial and peroneal. He is also been to see vascular and interventional radiologist. He was not felt to have any significant superficial venous insufficiency. Presumably is not a candidate for any ablation. It was suggested he come here for wound care. The patient is a type II diabetic on insulin. He also has a history of venous insufficiency. ABIs on the left were noncompressible in our clinic 10/21/17; patient we admitted to the clinic last week. 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 We put Iodosorb on him after an aggressive debridement and 3 layer compression. He complained of pain in his ankle and itching with is skin in fact he scratched the area on the medial calf superiorly at the rim of our wraps and he has 2 small open areas in that location today which are new. I changed his primary dressing today to silver collagen. As noted he is already had revascularization and does not have any significant  superficial venous insufficiency that would be amenable to ablation 10/28/17; patient admitted to the clinic 2 weeks ago. He has a smaller Wound. Scratch injury from last week revealed. There is large wound over the tibial area. This is smaller. Granulation looks healthy. No need for debridement. 11/04/17; the wound on the left lateral calf looks better. Improved dimensions. Surface  of this looks better. We've been maintaining him and Kerlix Coban wraps. He finds this much more comfortable. Silver collagen dressing 11/11/17; left lateral Wound continues to look healthy be making progress. Using a #5 curet I removed removed nonviable skin from the surface of the wound and then necrotic debris from the wound surface. Surface of the wound continues to look healthy. ooHe also has an open area on the left great toenail bed. We've been using topical antibiotics. 11/19/17; left anterior lateral wound continues to look healthy but it's not closed. ooHe also had a small wound above this on the left leg ooInitially traumatic wounds in the setting of significant chronic venous insufficiency and stasis dermatitis 11/25/17; left anterior wounds superiorly is closed still a small wound inferiorly. 12/02/17; left anterior tibial area. Arrives today with adherent callus. Post debridement clearly not completely closed. Hydrofera Blue under 3 layer compression. 12/09/17; left anterior tibia. Circumferential eschar however the wound bed looks stable to improved. We've been using Hydrofera Blue under 3 layer compression 12/17/17; left anterior tibia. Apparently this was felt to be closed however when the wrap was taken off there is a skin tear to reopen wounds in the same area we've been using Hydrofera Blue under 3 layer compression 12/23/17 left anterior tibia. Not close to close this week apparently the [facility] was stuck to this again. Still circumferential eschar requiring debridement. I put a contact layer on this  this time under the Hydrofera Blue 12/31/17; left anterior tibia. Wound is better slight amount of hyper-granulation. Using Hydrofera Blue over Adaptic. 01/07/18; left anterior tibia. The wound had some surface eschar however after this was removed he has no open wound.he was already revascularized by Dr. [name] when he came to our clinic with atherectomy of the left SFA and popliteal artery. He was also sent to interventional radiology for venous reflux studies. He was not felt to have significant reflux but certainly has chronic venous changes of his skin with hemosiderin deposition around this area. He will definitely need to lubricate his skin and wear compression stocking and I've talked to him about this. READMISSION 05/26/2018 This is a now 71 year old man we cared for with traumatic wounds on his left anterior lower extremity. He had been previously revascularized during that admission by Dr. [name]. Apparently in follow-up Dr. [name] noted that he had deterioration in his arterial status. He underwent a stent placement in the distal left SFA on 04/22/2018. 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 He went back to the angiography suite on 04/30/2018 he underwent PTA and balloon angioplasty of the occluded left mid anterior tibial artery, thrombotic occlusion went from 100 to 0% which reconstitutes the posterior tibial artery. He had thrombectomy and aspiration of the peroneal artery. The stent placed in the distal SFA left SFA was still occluded. 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 He has been recuperating at [facility] in [facility] after the most recent hospitalization. He is going home tomorrow. He tells me that 2  weeks ago he traumatized the tip of his left fifth toe. He came in urgently for our review of this. This was a history of before I noted that Dr. [name] had already noted dry gangrenous changes  of the left fifth toe 06/09/2018; 2-week follow-up. I did contact Dr. [name] after his last appointment and he apparently saw 1 of Dr. Irven Shelling colleagues the next day. He does not follow-up with Dr. [name] himself until Thursday of this week. 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 Nevertheless there is no evidence of infection no drainage and no pain. He had a new area that this week when we were signing him in today on the left anterior mid tibia area, this is in close proximity to the previous wound we have dealt with in this clinic. 06/23/2018; 2-week follow-up. I did not receive a recent note from Dr. [name] to review today. Our office is trying to obtain this. He is apparently not planning to do further vascular interventions and wondered about compression to try and help with the patient's chronic venous insufficiency. However we are also concerned about the arterial flow. ooHe arrives in clinic today with a new area on the left third toe. The areas on the calf/anterior tibia are close to closing. The left fifth toe is still mummified using Betadine. -In reviewing things with the patient he has what sounds like claudication with mild to moderate amount of activity. 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 I prescribed Levaquin over the phone while we attempted to arrange a plan of care. However the patient called yesterday to report he had low-grade fever and he came in today acutely. There is been a marked deterioration in the left third toe with spreading cellulitis up into the dorsal left foot. He was referred to the emergency room. Readmission: 06/29/2020 patient presents today for reevaluation here in our clinic he was previously treated by Dr.  Dellia Nims at the latter part of 2019 in 2 the beginning of 2020. Subsequently we have not seen him since that time in the interim he did have evaluation with vein and vascular specialist specifically Dr. Anice Paganini who did perform quite extensive work for a left femoral to anterior tibial artery bypass. With that being said in the interim the patient has developed significant lymphedema and has wounds that he tells me have really never healed in regard to the incision site on the left leg. He also has multiple wounds on the feet for various reasons some of which is that he tends to pick at his feet. Fortunately there is no signs of active infection systemically at this time he does have some wounds that are little bit deeper but most are fairly superficial he seems to have good blood flow and overall everything appears to be healthy I see no bone exposed and no obvious signs of osteomyelitis. I do not know that he necessarily needs a x-ray at this point although that something we could consider depending on how things progress. The patient does have a history of lymphedema, diabetes, this is type II, chronic kidney disease stage III, hypertension, and history of peripheral vascular disease. 07/05/2020; patient admitted last week. Is a patient I remember from 2019 he had a spreading infection involving the left foot and we sent him to the hospital. 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 He subsequently had a left femoral to anterior tibial bypass by Dr.[facility] vein [facility] vascular. He also has severe lymphedema with chronic skin changes related to that on the left leg. The most problematic area that was new today was on the  left medial great toe. This was apparently a small area last week there was purulent drainage which our intake nurse cultured. Also areas on the left medial foot and heel left lateral foot. He has 2 areas on the left medial calf left lateral calf in  the setting of the severe lymphedema. 07/13/2020 on evaluation today patient appears to be doing better in my opinion compared to his last visit. The good news is there is no signs of active infection systemically and locally I do not see any signs of infection either. He did have an x-ray which was negative that is great news he had a culture which showed MRSA but at the same time he is been on the doxycycline which has helped. I do think we may want to extend this for 7 additional days 1/25; patient admitted to the clinic a few weeks ago. 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 We have been putting him under compression his edema control is a lot better but he is severe verricused skin on the left leg. He is really done quite well he still has an open area on the left medial calf and the left medial first metatarsal head. We have been using silver collagen on the leg silver alginate on the foot 07/27/2020 upon evaluation today patient appears to be doing decently well in regard to his wounds. He still has a lot of dry skin on the left leg. Some of this is starting to peel back and I think he may be able to have them out by removing some that today. Fortunately there is no signs of active infection at this time on the left leg although on the right leg he does appear to have swelling and erythema as well as some mild warmth to touch. This does have been concerned about the possibility of cellulitis although within the differential diagnosis I do think that potentially a DVT has to be at least considered. We need to rule that out before proceeding would just call in the cellulitis. Especially since he is having pain in the posterior aspect of his calf muscle. 2/8; the patient had seen sparingly. He has severe skin changes of chronic lymphedema in the left leg thickened hyperkeratotic verrucous skin. He has an open wound on the medial part of the left first  met head left mid tibia. He also has a rim of nonepithelialized skin in the anterior mid tibia. He brought in the AmLactin lotion that was been prescribed although I am not sure under compression and its utility. There concern about cellulitis on the right lower leg the last time he was here. He was put on on antibiotics. His DVT rule out was negative. The right leg looks fine he is using his stocking on this area 08/10/2020 upon evaluation today patient appears to be doing well with regard to his leg currently. He has been tolerating the dressing changes without complication. Fortunately there is no signs of active infection which is great news. Overall very pleased with where things stand. 2/22; the patient still has an area on the medial part of the left first met his head. This looks better than when I last saw this earlier this month he has a rim of epithelialization but still some surface debris. Mostly everything on the left leg is healed. There is still a vulnerable in the left mid tibia area. 08/30/2020 upon evaluation today patient appears to be doing much better in  regard to his wounds on his foot. 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 Nonetheless I think we will address that today I Minna send in a prescription for him in that regard. Overall though there does not appear to be any signs of significant worsening. 09/07/2020 on evaluation today patient's wounds over his left foot appear to be doing excellent. I do not see any signs of infection there is some callus buildup this can require debridement for certain but overall I feel like he is managing quite nicely. He still using the AmLactin cream which has been beneficial for him as well. 3/22; left foot wound is closed. There is no open area here. He is using ammonium lactate lotion to the lower  extremities to help exfoliate dry cracked skin. He has compression stockings from [employer] therapy in [place]. The wound on the medial part of his left first met head is healed today. READMISSION 04/12/2021 Mr. Heyden is a patient we know fairly well he had a prolonged stay in clinic in 2019 with wounds on his left lateral and left anterior lower extremity in the setting of chronic venous insufficiency. More recently he was here earlier this year with predominantly an area on his left foot first metatarsal head plantar and he says the plantar foot broke down on its not long after we discharged him but he did not come back here. The last few months areas of broken down on his left anterior and again the left lateral lower extremity. The leg itself is very swollen chronically enlarged a lot of hyperkeratotic dry Berry Q skin in the left lower leg. His edema extends well into the thigh. He was seen by Dr. Donzetta Matters. He had ABIs on 03/02/2021 showing an ABI on the right of 1 with a TBI of 0.72 his ABI in the left at 1.09 TBI of 0.99. Monophasic and biphasic waveforms on the right. On the left monophasic waveforms were noted he went on to have an angiogram on 03/27/2021 this showed the aortic aortic and iliac segments were free of flow-limiting stenosis the left common femoral vein to evaluate the left femoral to anterior tibial artery bypass was unobstructed the bypass was patent without any areas of stenosis. We discharged the patient in bilateral juxta lite stockings but very clearly that was not sufficient to control the swelling and maintain skin integrity. He is clearly going to need compression pumps. 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 This is fortunate because he is on his feet for long periods of time. 10/27; patient comes in with our intake nurse reporting copious amount of green drainage from the left anterior mid tibia the left dorsal foot  and to a lesser extent the left medial mid tibia. We left the compression wrap on all week for the amount of edema in his left leg is quite a bit better. We use silver alginate as the primary dressing 11/3; edema control is good. Left anterior lower leg left medial lower leg and the plantar first metatarsal head. The left anterior lower leg required debridement. Deep tissue culture I did of this wound showed MRSA I put him on 10 days of doxycycline which she will start today. We have him in compression wraps. He has a security card and [employer]  however he is retiring on November 15. We will need to then get him into a better offloading boot for the left foot perhaps a total contact cast 11/10; edema control is quite good. Left anterior and left medial lower leg wounds in the setting of chronic venous insufficiency and lymphedema. He also has a substantial area over the left plantar first metatarsal head. 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 He has significant hypergranulation on the left plantar foot wound. The patient is a diabetic but he does not have significant PAD 11/17; edema control is quite good. Left anterior and left medial lower leg wounds look better. The really concerning area remains the area on the left plantar first metatarsal head. He has a rim of epithelialization. 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 Starting today with a forefoot off loader but . possibly a total contact cast. 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 Arrives with a very significant odor from I think all of the wounds on the left leg and the left foot. Because of this I did not put a total  contact cast on him today but will could still consider this. His wife was having cataract surgery which is the reason he missed the appointment 12/6. I saw this man 5 days ago with a swelling below the popliteal fossa. I thought he actually might have a Baker's cyst however the DVT rule out study that we could arrange right away was negative the technician told me this was not a ruptured Baker's cyst. We attempted to get this aspirated by under ultrasound guidance in interventional radiology however all they did was an ultrasound however it shows an extensive fluid collection 62 x 8 x 9.4 in the left thigh and left calf. The patient states he thinks this started 8 days ago or so but he really is not complaining of any pain, fever or systemic symptoms. He has not ha 12/20; after some difficulty I managed to get the patient into see Dr. Donzetta Matters. Eventually he was taken into the hospital and had a drain put in the fluid collection below his left knee posteriorly extending into the posterior thigh. He still has the drain in place. Culture of this showed moderate staff aureus few Morganella and few Klebsiella he is now on doxycycline and ciprofloxacin as suggested by infectious disease he is on this for a month. The drain will remain in place until it stops draining 12/29; he comes in today with the 1 wound on his left leg and the area on the left plantar first met head significantly smaller. Both look healthy. He still has the drain in the left leg. He says he has to change this daily. Follows up with Dr. Donzetta Matters on January 11. 06/29/2021; the wounds that I am following on the left leg and left first met head continued to be quite healthy. However the area where his inferior drain is in place had copious amounts of drainage which was green in color. The wound here is larger. Follows up with Dr. Gwenlyn Saran of vein and vascular his surgeon next week as well as infectious disease. He remains  on ciprofloxacin and  doxycycline. He is not complaining of excessive pain in either one of the drain areas 1/12; the patient saw vascular surgery and infectious disease. Vascular surgery has left the drain in place as there was still some notable drainage still see him back in 2 weeks. Dr. Velna Ochs stop the doxycycline and ciprofloxacin and I do not believe he follows up with them at this point. Culture I did last week showed both doxycycline resistant MRSA and Pseudomonas not sensitive to ciprofloxacin although only in rare titers 1/19; the patient's wound on the left anterior lower leg is just about healed. We have continued healing of the area that was medially on the left leg. Left first plantar metatarsal head continues to get smaller. The major problem here is his 2 drain sites 1 on the left upper calf and lateral thigh. There is purulent drainage still from the left lateral thigh. I gave him antibiotics last week but we still have recultured. He has the drain in the area I think this is eventually going to have to come out. I suspect there will be a connecting wound to heal here perhaps with improved VAc 1/26; the patient had his drain removed by vein and vascular on 1/25/. This was a large pocket of fluid in his left thigh that seem to tunnel into his left upper calf. 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 His mention his Penrose drain was removed today. He now has a tunneling wound on his left calf and left thigh. Both of these probe widely towards each other although I cannot really prove that they connect. Both wounds on his lower leg anteriorly are closed and his area over the first metatarsal head on his right foot continues to improve. We are using Hydrofera Blue here. He also saw infectious disease culture of the abscess they noted was polymicrobial with MRSA, Morganella and Klebsiella he was treated with doxycycline and ciprofloxacin for 4 weeks ending on 07/03/2021. They did not recommend  any further antibiotics. Notable that while he still had the Penrose drain in place last week he had purulent drainage coming out of the inferior IandD site this grew [facility], MRSA and Pseudomonas but there does not appear to be any active infection in this area today with the drain out and he is not systemically unwell 2/2; with regards to the drain sites the superior one on the thigh actually is closed down the one on the upper left lateral calf measures about 8 and half centimeters which is an improvement seems to be less prominent although still with a lot of drainage. The only remaining wound is over the first metatarsal head on the left foot and this looks to be continuing to improve with Hydrofera Blue. 2/9; the area on his plantar left foot continues to contract. Callus around the wound edge. The drain sites specifically have not come down in depth. We put the wound VAC on Monday he changed the canister late last night our intake nurse reported a pocket of fluid perhaps caused by our compression wraps 2/16; continued improvement in left foot plantar wound. drainage site in the calf is not improved in terms of depth (wound vac) 2/23; continued improvement in the left foot wound over the first metatarsal head. With regards to the drain sites the area on his thigh laterally is healed however the open area on his calf is small in terms of circumference by still probes in by about 15  cm. Within using the wound VAC. Hydrofera Blue on his foot 08/24/2021: The left first metatarsal head wound continues to improve. The wound bed is healthy with just some surrounding callus. Unfortunately the open drain site on his calf remains open and tunnels at least 15 cm (the extent of a Q-tip). This is despite several weeks of wound VAC treatment. Based on reading back through the notes, there has been really no significant change in the depth of the wound, although the orifice is smaller and the more cranial  wound on his thigh has closed. I suspect the tunnel tracks nearly all the way to this location. 08/31/2021: Continued improvement in the left first metatarsal head wound. There has been absolutely no improvement to the long tunnel from his open drain site on his calf. We have tried to get him into see vascular surgery sooner to consider the possibility of simply filleting the tract open and allowing it to heal from the bottom up, likely with a wound VAC. They have not yet scheduled a sooner appointment than his current mid April 09/14/2021: He was seen by vascular surgery and they took him to the operating room last week. They opened a portion of the tunnel, but did not extend the entire length of the known open subcutaneous tract. I read Dr. Claretha Cooper operative note and it is not clear from that documentation why only a portion of the tract was opened. The heaped up granulation tissue was curetted and removed from at least some portion of the tract. They did place a wound VAC and applied an Unna boot to the leg. The ulcer on his left first metatarsal head is smaller today. The bed looks good and there is just a small amount of surrounding callus. 09/21/2021: The ulcer on his left first metatarsal head looks to be stalled. There is some callus surrounding the wound but the wound bed itself does not appear particularly dynamic. The tunnel tract on his lateral left leg seems to be roughly the same length or perhaps slightly smaller but the wound bed appears healthy with good granulation tissue. He opened up a new wound on his medial thigh and the site of a prior surgical incision. He says that he did this unconsciously in his sleep by scratching. 09/28/2021: Unfortunately, the ulcer on his left first metatarsal head has extended underneath the callus toward the dorsum of his foot. The medial thigh wounds are roughly the same. The tunnel on his lateral left leg continues to be problematic; it is longer than we are  able to actually probe with a Q-tip. I am still not certain as to why Dr. Donzetta Matters did not open this up entirely when he took the patient to the operating room. We will likely be back in the same situation with just a small superficial opening in a long unhealed tract, as the open portion is granulating in nicely. 10/02/2021: The patient was initially scheduled for a nurse visit, but we are also applying a total contact cast today. The plantar foot wound looks clean without significant accumulated callus. We have been applying Prisma silver collagen to the site. 10/05/2021: The patient is here for his first total contact cast change. We have tried using gauze packing strips in the tunnel on his lateral leg wound, but this does not seem to be working any better than the white VAC foam. The foot ulcer looks about the same with minimal periwound callus. Medial thigh wound is clean with just some overlying eschar.  10/12/2021: The plantar foot wound is stable without any significant accumulation of periwound callus. The surface is viable with good granulation tissue. The medial thigh wounds are much smaller and are epithelializing. On the other hand, he had purulent drainage coming from the tunnel on his lateral leg. He does go back to see Dr. Donzetta Matters next week and is planning to ask him why the wound tunnel was not completely opened at the time of his most recent operation. 10/19/2021: The plantar foot wound is markedly improved and has epithelial tissue coming through the surface. The medial thigh wounds are nearly closed with just a tiny open area. He did see Dr. Donzetta Matters earlier this week and apparently they did discuss the possibility of opening the sinus tract further and enabling a wound VAC application. Apparently there are some limits as to what Dr. 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 I think if we could get the tract open to the level of the popliteal fossa, this would  greatly aid in her ability to get this chart closed. That being said, however, today when I probed the tract with a Q-tip, I was not able to insert the entirety of the Q-tip as I have on previous occasions. The tunnel is shorter by about 4 cm. The surface is clean with good granulation tissue and no further episodes of purulent drainage. 10/30/2021: Last week, the patient underwent surgery and had the long tract in his leg opened. There was a rind that was debrided, according to the operative report. His medial thigh ulcers are closed. The plantar foot wound is clean with a good surface and some built up surrounding callus. 11/06/2021: The overall dimensions of the large wound on his lateral leg remain about the same, but there is good granulation tissue present and the tunneling is a little bit shorter. He has a new wound on his anterior tibial surface, in the same location where he had a similar lesion in the past. The plantar foot wound is clean with some buildup surrounding callus. Just toward the medial aspect of his foot, however, there is an area of darkening that once debrided, revealed another opening in the skin surface. 11/13/2021: The anterior tibial surface wound is closed. The plantar foot wound has some surrounding callus buildup. The area of darkening that I debrided last week and revealed an opening in the skin surface has closed again. The tunnel in the large wound on his lateral leg has come in by about 3 cm. There is healthy granulation tissue on the entire wound surface. 11/23/2021: The patient was out of town last week and did wet-to-dry dressings on his large wound. 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 Unfortunately, he picked open the wound on his left medial thigh. He says that it was itching and he just could not stop scratching it until it was open again. The wound on his plantar foot is smaller and has not  accumulated a tremendous amount of callus. The lateral leg wound is shallower and the tunnel has also decreased in depth. There is just a little bit of slough accumulation on the surface. 11/30/2021: Another portion of his left medial thigh has been opened up. All of these wounds are fairly superficial with just a little bit of slough and eschar accumulation. The wound on his plantar foot is almost closed with just a bit of eschar and periwound callus accumulation.  The lateral leg wound is nearly flush with the surrounding skin and the tunnel is markedly shallower. 12/07/2021: There is just 1 open area on his left medial thigh. It is clean with just a little bit of perimeter eschar. The wound on his plantar foot continues to contract and just has some eschar and periwound callus accumulation. The lateral leg wound is closing at the more distal aspect and the tunnel is smaller. The surface is nearly flush with the surrounding skin and it has a good bed of granulation tissue. 12/14/2021: The thigh and foot wounds are closed. The lateral leg wound has closed over approximately half of its length. The tunnel continues to contract and the surface is now flush with the surrounding skin. The wound bed has robust granulation tissue. 12/22/2021: The thigh and foot wounds have reopened. The foot wound has a lot of callus accumulation around and over it. The thigh wound is tiny with just a little bit of slough in the wound bed. The lateral leg wound continues to contract. His vascular surgeon took the wound VAC off earlier in the week and the patient has been doing wet-to-dry dressings. There is a little slough accumulation on the surface. The tunnel is about 3 cm in depth at this point. 12/28/2021: The thigh wound is closed again. The foot wound has some callus that subsequently has peeled back exposing just a small slit of a wound. The lateral leg wound Is down to about half the size that it originally was and the  tunnel is down to about half a centimeter in depth. 01/04/2022: The thigh wound remains closed. The foot wound has heavy callus overlying the wound site. Once this was debrided, the wound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

## 2022-01-17 NOTE — Progress Notes (Signed)
JORGELUIS, GURGANUS ([id number]) Visit Report for 01/09/2022 SuperBill Details Patient Name: Date of Service: OVIE, CORNELIO 01/09/2022 Medical Record Number: [id number] Patient Account Number: [id number] Date of Birth/Sex: Treating RN: 1951-04-30 (71 y.o. 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

## 2022-01-17 NOTE — Progress Notes (Signed)
JUANITA, DEVINCENT ([id number]) Visit Report for 01/09/2022 Arrival Information Details Patient Name: Date of Service: [name], [name] 01/09/2022 11:15 A M Medical Record Number: [id number] Patient Account Number: [id number] Date of Birth/Sex: Treating RN: 07-11-1950 (71 y.o. [name], [name] Primary [name] [name]: [name] Other Clinician: Donavan [name] Referring [name]: Treating [name] in Treatment: 51 Visit Information History Since Last Visit All ordered tests and consults were completed: Yes Patient Arrived: Ambulatory Added or deleted any medications: No Arrival Time: 11:52 Any new allergies or adverse reactions: No Accompanied By: self Had a fall or experienced change in No Transfer Assistance: None activities of daily living that may affect Patient Identification Verified: Yes risk of falls: Secondary Verification Process Completed: Yes Signs or symptoms of abuse/neglect since last visito No Patient Requires Transmission-Based Precautions: No Hospitalized since last visit: No Patient Has Alerts: Yes Implantable device outside of the clinic excluding No cellular tissue based products placed in the center since last visit: Pain Present Now: No Electronic Signature(s) Signed: 01/17/2022 5:39:28 PM By: [name] CHT EMT BS , , Entered By: [name] on 01/09/2022 11:53:13 -------------------------------------------------------------------------------- Compression Therapy Details Patient Name: Date of Service: [name]. 01/09/2022 11:15 A M Medical Record Number: [id number] Patient Account Number: [id number] Date of Birth/Sex: Treating RN: 11-19-1950 (71 y.o. [name] Nami Strawder: [name] Other Clinician: Referring Ameia Morency: Treating [name]/Extender: [name] in Treatment: 38 Compression Therapy Performed for Wound Assessment: Wound #22  Left,Lateral Lower Leg Performed By: Clinician [name] Gouty, RN Compression Type: Double Layer Electronic Signature(s) Signed: 01/17/2022 5:39:28 PM By: [name] CHT EMT BS , , Entered By: [name] on 01/09/2022 12:28:19 -------------------------------------------------------------------------------- Encounter Discharge Information Details Patient Name: Date of Service: [name]. 01/09/2022 11:15 A M Medical Record Number: [id number] Patient Account Number: [id number] Date of Birth/Sex: Treating RN: 14-Aug-1950 (71 y.o. [name] Billee Balcerzak: Other Clinician: Jilda [name] Referring [name]: Treating Jsean Taussig/Extender: [name] in Treatment: 78 Encounter Discharge Information Items Discharge Condition: Stable Ambulatory Status: Ambulatory Discharge Destination: Home Transportation: Private Auto Accompanied By: self Schedule Follow-up Appointment: Yes Clinical Summary of [name]: Patient Declined Electronic Signature(s) Signed: 01/17/2022 5:39:28 PM By: [name] CHT EMT BS , , Entered By: [name] on 01/09/2022 12:29:06 -------------------------------------------------------------------------------- Negative Pressure Wound Therapy Maintenance (NPWT) Details Patient Name: Date of Service: [name] 01/09/2022 11:15 A M Medical Record Number: [id number] Patient Account Number: [id number] Date of Birth/Sex: Treating RN: Mar 09, 1951 (70 y.o. [name] Marykate Heuberger: [name] Other Clinician: Referring Foster Frericks: Treating [name]/Extender: [name] in Treatment: 38 NPWT Maintenance Performed for: Wound #22 Left, Lateral Lower Leg Performed By: [name] Gouty, RN Type: VAC System Coverage Size (sq cm): 17.6 Pressure Type: Constant Pressure Setting: 125 mmHG Drain Type: None Sponge/Dressing Type: Foam, Black Date Initiated:  07/27/2021 Dressing Removed: No Quantity of Sponges/Gauze Removed: 1 Canister Changed: No Dressing Reapplied: No Quantity of Sponges/Gauze Inserted: 1 Respones T Treatment: o good Days On NPWT : 167 Electronic Signature(s) Signed: 01/17/2022 5:39:28 PM By: [name] CHT EMT BS , , Entered By: [name] on 01/09/2022 12:28:04 -------------------------------------------------------------------------------- Patient/Caregiver Education Details Patient Name: Date of Service: [name] 7/18/2023andnbsp11:15 A M Medical Record Number: [id number] Patient Account Number: [id number] Date of Birth/Gender: Treating RN: [birth year] (71 y.o. [name] Physician: [name] Other Clinician: Referring Physician: Treating Physician/Extender: [name],  [name] in Treatment: 38 Education Assessment Education Provided To: Patient Education Topics Provided Venous: Methods: Explain/Verbal Responses: Reinforcements needed, State content correctly [employer]) Signed: 01/17/2022 5:39:28 PM By: [name] CHT EMT BS , , Entered By: [name] on 01/09/2022 12:28:50 -------------------------------------------------------------------------------- Wound Assessment Details Patient Name: Date of Service: [name]. 01/09/2022 11:15 A M Medical Record Number: [id number] Patient Account Number: [id number] Date of Birth/Sex: Treating RN: [birth year] (71 y.o. [name] Clessie Karras: [name] Other Clinician: Referring Jamyla Ard: Treating [name]/Extender: [name] in Treatment: 38 Wound Status Wound Number: 22 Primary Etiology: Cyst Wound Location: Left, Lateral Lower Leg Wound Status: Open Wounding Event: Bump Date Acquired: 06/03/2021 Weeks Of Treatment: 31 Clustered Wound: No Wound Measurements Length: (cm) 8.8 Width: (cm) 2 Depth: (cm) 0.1 Area:  (cm) 13.823 Volume: (cm) 1.382 % Reduction in Area: -738.3% % Reduction in Volume: -4.8% Wound Description Classification: Full Thickness With Exposed Support Structures Exudate Amount: Medium Exudate Type: Serosanguineous Exudate Color: red, brown Electronic Signature(s) Signed: 01/09/2022 5:31:58 PM By: [name] Gouty RN, BSN Signed: 01/17/2022 5:39:28 PM By: [name] CHT EMT BS , , Entered By: [name] on 01/09/2022 12:27:24 -------------------------------------------------------------------------------- Vitals Details Patient Name: Date of Service: [name]. 01/09/2022 11:15 A M Medical Record Number: [id number] Patient Account Number: [id number] Date of Birth/Sex: Treating RN: 1951/02/01 (71 y.o. [name] Adalynn Corne: [name] Other Clinician: Donavan [name] Referring Simranjit Thayer: Treating Rondarius Kadrmas/Extender: [name] in Treatment: 38 Vital Signs Time Taken: 11:54 Temperature (F): 98 Height (in): 74 Pulse (bpm): 58 Weight (lbs): 238 Respiratory Rate (breaths/min): 18 Body Mass Index (BMI): 30.6 Blood Pressure (mmHg): 150/87 Reference Range: 80 - 120 mg / dl Electronic Signature(s) Signed: 01/17/2022 5:39:28 PM By: [name] CHT EMT BS , , Entered By: [name] on 01/09/2022 11:54:53

## 2022-01-19 ENCOUNTER — Encounter (HOSPITAL_BASED_OUTPATIENT_CLINIC_OR_DEPARTMENT_OTHER): Payer: Medicare Other | Admitting: General Surgery

## 2022-01-19 DIAGNOSIS — E1151 Type 2 diabetes mellitus with diabetic peripheral angiopathy without gangrene: Secondary | ICD-10-CM | POA: Diagnosis not present

## 2022-01-19 NOTE — Progress Notes (Signed)
ROBBI, SCURLOCK ([id number]) Visit Report for 01/19/2022 Chief Complaint Document Details Patient Name: Date of Service: [name], [name] 01/19/2022 11:15 A M Medical Record Number: [id number] Patient Account Number: [id number] Date of Birth/Sex: Treating RN: [birth year] (71 y.o. M) Primary Care Provider: [name] Other Clinician: Referring Provider: Treating Provider/Extender: [name] in Treatment: 40 Information Obtained from: Patient Chief Complaint Left leg and foot ulcers 04/12/2021; patient is here for wounds on his left lower leg and left plantar foot over the first metatarsal head Electronic Signature(s) Signed: 01/19/2022 11:58:23 AM By: Fredirick Maudlin MD FACS Entered By: Fredirick Maudlin on 01/19/2022 11:58:23 -------------------------------------------------------------------------------- Debridement Details Patient Name: Date of Service: [name]. 01/19/2022 11:15 A M Medical Record Number: [id number] Patient Account Number: [id number] Date of Birth/Sex: Treating RN: [birth year] (71 y.o. [name], [name] Primary Care Provider: [name] Other Clinician: Referring Provider: Treating Provider/Extender: [name] in Treatment: 40 Debridement Performed for Assessment: Wound #22 Left,Lateral Lower Leg Performed By: Physician Fredirick Maudlin, MD Debridement Type: Debridement Level of Consciousness (Pre-procedure): Awake and Alert Pre-procedure Verification/Time Out Yes - 11:42 Taken: Start Time: 11:42 T Area Debrided (L x W): otal 9.5 (cm) x 1.8 (cm) = 17.1 (cm) Tissue and other material debrided: Non-Viable, Slough, Biofilm, Slough Level: Non-Viable Tissue Debridement Description: Selective/Open Wound Instrument: Curette Bleeding: Minimum Hemostasis Achieved: Pressure Procedural Pain: 0 Post Procedural Pain: 0 Response to Treatment: Procedure was tolerated well Level of Consciousness (Post- Awake  and Alert procedure): Post Debridement Measurements of Total Wound Length: (cm) 9.5 Width: (cm) 1.8 Depth: (cm) 0.1 Volume: (cm) 1.343 Character of Wound/Ulcer Post Debridement: Improved Post Procedure Diagnosis Same as Pre-procedure Electronic Signature(s) Signed: 01/19/2022 12:03:47 PM By: Fredirick Maudlin MD FACS Signed: 01/19/2022 2:55:40 PM By: Baruch Gouty RN, BSN Entered By: Baruch Gouty on 01/19/2022 11:45:04 -------------------------------------------------------------------------------- HPI Details Patient Name: Date of Service: [name]. 01/19/2022 11:15 A M Medical Record Number: [id number] Patient Account Number: [id number] Date of Birth/Sex: Treating RN: 04-Sep-1950 (71 y.o. M) Primary Care Provider: [name] Other Clinician: Referring Provider: Treating Provider/Extender: [name] in Treatment: 40 History of Present Illness HPI Description: 10/11/17; [name] is a 71 year old man who tells me that in 2015 he slipped down the latter traumatizing his left leg. He developed a wound in the same spot the area that we are currently looking at. He states this closed over for the most part although he always felt it was somewhat unstable. In 2016 he hit the same area with the door of his car had this reopened. He tells me that this is never really closed although sometimes an inflow it remains open on a constant basis. He has not been using any specific dressing to this except for topical antibiotics the nature of which were not really sure. His primary doctor did send him to see Dr. [name] of interventional cardiology. He underwent an angiogram on 08/06/17 and he underwent a PTA and directional atherectomy of the lesser distal SFA and popliteal arteries which resulted in brisk improvement in blood flow. It was noted that he had 2 vessel runoff through the anterior tibial and peroneal. He is also been to see vascular and interventional  radiologist. He was not felt to have any significant superficial venous insufficiency. Presumably is not a candidate for any ablation. It was suggested he come here for wound care. The patient is a type II diabetic on insulin. He also has a history of  venous insufficiency. ABIs on the left were noncompressible in our clinic 10/21/17; patient we admitted to the clinic last week. 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 We put Iodosorb on him after an aggressive debridement and 3 layer compression. He complained of pain in his ankle and itching with is skin in fact he scratched the area on the medial calf superiorly at the rim of our wraps and he has 2 small open areas in that location today which are new. I changed his primary dressing today to silver collagen. As noted he is already had revascularization and does not have any significant superficial venous insufficiency that would be amenable to ablation 10/28/17; patient admitted to the clinic 2 weeks ago. He has a smaller Wound. Scratch injury from last week revealed. There is large wound over the tibial area. This is smaller. Granulation looks healthy. No need for debridement. 11/04/17; the wound on the left lateral calf looks better. Improved dimensions. Surface of this looks better. We've been maintaining him and Kerlix Coban wraps. He finds this much more comfortable. Silver collagen dressing 11/11/17; left lateral Wound continues to look healthy be making progress. Using a #5 curet I removed removed nonviable skin from the surface of the wound and then necrotic debris from the wound surface. Surface of the wound continues to look healthy. He also has an open area on the left great toenail bed. We've been using topical antibiotics. 11/19/17; left anterior lateral wound continues to look healthy but it's not closed. He also had a small wound above this on the left leg Initially  traumatic wounds in the setting of significant chronic venous insufficiency and stasis dermatitis 11/25/17; left anterior wounds superiorly is closed still a small wound inferiorly. 12/02/17; left anterior tibial area. Arrives today with adherent callus. Post debridement clearly not completely closed. Hydrofera Blue under 3 layer compression. 12/09/17; left anterior tibia. Circumferential eschar however the wound bed looks stable to improved. We've been using Hydrofera Blue under 3 layer compression 12/17/17; left anterior tibia. Apparently this was felt to be closed however when the wrap was taken off there is a skin tear to reopen wounds in the same area we've been using Hydrofera Blue under 3 layer compression 12/23/17 left anterior tibia. Not close to close this week apparently the [facility] was stuck to this again. Still circumferential eschar requiring debridement. I put a contact layer on this this time under the Hydrofera Blue 12/31/17; left anterior tibia. Wound is better slight amount of hyper-granulation. Using Hydrofera Blue over Adaptic. 01/07/18; left anterior tibia. The wound had some surface eschar however after this was removed he has no open wound.he was already revascularized by Dr. [name] when he came to our clinic with atherectomy of the left SFA and popliteal artery. He was also sent to interventional radiology for venous reflux studies. He was not felt to have significant reflux but certainly has chronic venous changes of his skin with hemosiderin deposition around this area. He will definitely need to lubricate his skin and wear compression stocking and I've talked to him about this. READMISSION 05/26/2018 This is a now 71 year old man we cared for with traumatic wounds on his left anterior lower extremity. He had been previously revascularized during that admission by Dr. [name]. Apparently in follow-up Dr. [name] noted that he had deterioration in his arterial status. He underwent a  stent placement in the distal left SFA on 04/22/2018. 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 He went back to the angiography suite on 04/30/2018 he underwent PTA and balloon angioplasty of the occluded left mid anterior tibial artery, thrombotic occlusion went from 100 to 0% which reconstitutes the posterior tibial artery. He had thrombectomy and aspiration of the peroneal artery. The stent placed in the distal SFA left SFA was still occluded. 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 He has been recuperating at [facility] in [facility] after the most recent hospitalization. He is going home tomorrow. He tells me that 2 weeks ago he traumatized the tip of his left fifth toe. He came in urgently for our review of this. This was a history of before I noted that Dr. [name] had already noted dry gangrenous changes of the left fifth toe 06/09/2018; 2-week follow-up. I did contact Dr. [name] after his last appointment and he apparently saw 1 of Dr. Irven Shelling colleagues the next day. He does not follow-up with Dr. [name] himself until Thursday of this week. 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 Nevertheless there is no evidence of infection no drainage and no pain. He had a new area that this week when we were signing him in today on the left anterior mid tibia area, this is in close proximity to the previous wound we have dealt with in this clinic. 06/23/2018; 2-week follow-up. I did not receive a recent note from Dr. [name] to review today. Our office is trying to obtain this. He is apparently not planning to do further vascular interventions and wondered about compression to try and help with the patient's  chronic venous insufficiency. However we are also concerned about the arterial flow. He arrives in clinic today with a new area on the left third toe. The areas on the calf/anterior tibia are close to closing. The left fifth toe is still mummified using Betadine. -In reviewing things with the patient he has what sounds like claudication with mild to moderate amount of activity. 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 I prescribed Levaquin over the phone while we attempted to arrange a plan of care. However the patient called yesterday to report he had low-grade fever and he came in today acutely. There is been a marked deterioration in the left third toe with spreading cellulitis up into the dorsal left foot. He was referred to the emergency room. Readmission: 06/29/2020 patient presents today for reevaluation here in our clinic he was previously treated by Dr. Dellia Nims at the latter part of 2019 in 2 the beginning of 2020. Subsequently we have not seen him since that time in the interim he did have evaluation with vein and vascular specialist specifically Dr. Anice Paganini who did perform quite extensive work for a left femoral to anterior tibial artery bypass. With that being said in the interim the patient has developed significant lymphedema and has wounds that he tells me have really never healed in regard to the incision site on the left leg. He also has multiple wounds on the feet for various reasons some of which is that he tends to pick at his feet. Fortunately there is no signs of active infection systemically at  this time he does have some wounds that are little bit deeper but most are fairly superficial he seems to have good blood flow and overall everything appears to be healthy I see no bone exposed and no obvious signs of osteomyelitis. I do not know that he necessarily needs a x-ray at this point although that something we could consider depending on how  things progress. The patient does have a history of lymphedema, diabetes, this is type II, chronic kidney disease stage III, hypertension, and history of peripheral vascular disease. 07/05/2020; patient admitted last week. Is a patient I remember from 2019 he had a spreading infection involving the left foot and we sent him to the hospital. 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 He subsequently had a left femoral to anterior tibial bypass by Dr.Cain vein and vascular. He also has severe lymphedema with chronic skin changes related to that on the left leg. The most problematic area that was new today was on the left medial great toe. This was apparently a small area last week there was purulent drainage which our intake nurse cultured. Also areas on the left medial foot and heel left lateral foot. He has 2 areas on the left medial calf left lateral calf in the setting of the severe lymphedema. 07/13/2020 on evaluation today patient appears to be doing better in my opinion compared to his last visit. The good news is there is no signs of active infection systemically and locally I do not see any signs of infection either. He did have an x-ray which was negative that is great news he had a culture which showed MRSA but at the same time he is been on the doxycycline which has helped. I do think we may want to extend this for 7 additional days 1/25; patient admitted to the clinic a few weeks ago. 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 We have been putting him under compression his edema control is a lot better but he is severe verricused skin on the left leg. He is really done quite well he still has an open area on the left medial calf and the left medial first metatarsal head. We have been using silver collagen on the leg silver alginate on the foot 07/27/2020 upon evaluation today patient appears to be doing decently  well in regard to his wounds. He still has a lot of dry skin on the left leg. Some of this is starting to peel back and I think he may be able to have them out by removing some that today. Fortunately there is no signs of active infection at this time on the left leg although on the right leg he does appear to have swelling and erythema as well as some mild warmth to touch. This does have been concerned about the possibility of cellulitis although within the differential diagnosis I do think that potentially a DVT has to be at least considered. We need to rule that out before proceeding would just call in the cellulitis. Especially since he is having pain in the posterior aspect of his calf muscle. 2/8; the patient had seen sparingly. He has severe skin changes of chronic lymphedema in the left leg thickened hyperkeratotic verrucous skin. He has an open wound on the medial part of the left first met head left mid tibia. He also has a rim of nonepithelialized skin in the anterior mid  tibia. He brought in the AmLactin lotion that was been prescribed although I am not sure under compression and its utility. There concern about cellulitis on the right lower leg the last time he was here. He was put on on antibiotics. His DVT rule out was negative. The right leg looks fine he is using his stocking on this area 08/10/2020 upon evaluation today patient appears to be doing well with regard to his leg currently. He has been tolerating the dressing changes without complication. Fortunately there is no signs of active infection which is great news. Overall very pleased with where things stand. 2/22; the patient still has an area on the medial part of the left first met his head. This looks better than when I last saw this earlier this month he has a rim of epithelialization but still some surface debris. Mostly everything on the left leg is healed. There is still a vulnerable in the left mid tibia area. 08/30/2020  upon evaluation today patient appears to be doing much better in regard to his wounds on his foot. 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 Nonetheless I think we will address that today I Minna send in a prescription for him in that regard. Overall though there does not appear to be any signs of significant worsening. 09/07/2020 on evaluation today patient's wounds over his left foot appear to be doing excellent. I do not see any signs of infection there is some callus buildup this can require debridement for certain but overall I feel like he is managing quite nicely. He still using the AmLactin cream which has been beneficial for him as well. 3/22; left foot wound is closed. There is no open area here. He is using ammonium lactate lotion to the lower extremities to help exfoliate dry cracked skin. He has compression stockings from elastic therapy in [place]. The wound on the medial part of his left first met head is healed today. READMISSION 04/12/2021 [name] is a patient we know fairly well he had a prolonged stay in clinic in 2019 with wounds on his left lateral and left anterior lower extremity in the setting of chronic venous insufficiency. More recently he was here earlier this year with predominantly an area on his left foot first metatarsal head plantar and he says the plantar foot broke down on its not long after we discharged him but he did not come back here. The last few months areas of broken down on his left anterior and again the left lateral lower extremity. The leg itself is very swollen chronically enlarged a lot of hyperkeratotic dry Berry Q skin in the left lower leg. His edema extends well into the thigh. He was seen by Dr. Donzetta Matters. He had ABIs on 03/02/2021 showing an ABI on the right of 1 with a TBI of 0.72 his ABI in the left at 1.09 TBI of  0.99. Monophasic and biphasic waveforms on the right. On the left monophasic waveforms were noted he went on to have an angiogram on 03/27/2021 this showed the aortic aortic and iliac segments were free of flow-limiting stenosis the left common femoral vein to evaluate the left femoral to anterior tibial artery bypass was unobstructed the bypass was patent without any areas of stenosis. We discharged the patient in bilateral juxta lite stockings but very clearly that was not sufficient to  control the swelling and maintain skin integrity. He is clearly going to need compression pumps. 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 This is fortunate because he is on his feet for long periods of time. 10/27; patient comes in with our intake nurse reporting copious amount of green drainage from the left anterior mid tibia the left dorsal foot and to a lesser extent the left medial mid tibia. We left the compression wrap on all week for the amount of edema in his left leg is quite a bit better. We use silver alginate as the primary dressing 11/3; edema control is good. Left anterior lower leg left medial lower leg and the plantar first metatarsal head. The left anterior lower leg required debridement. Deep tissue culture I did of this wound showed MRSA I put him on 10 days of doxycycline which she will start today. We have him in compression wraps. He has a security card and [employer] however he is retiring on November 15. We will need to then get him into a better offloading boot for the left foot perhaps a total contact cast 11/10; edema control is quite good. Left anterior and left medial lower leg wounds in the setting of chronic venous insufficiency and lymphedema. He also has a substantial area over the left plantar first metatarsal head. I treated him for MRSA that we identified on the major wound on the left anterior mid tibia with doxycycline and  gentamicin topically. He has significant hypergranulation on the left plantar foot wound. The patient is a diabetic but he does not have significant PAD 11/17; edema control is quite good. Left anterior and left medial lower leg wounds look better. The really concerning area remains the area on the left plantar first metatarsal head. He has a rim of epithelialization. 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 Starting today with a forefoot off loader but . possibly a total contact cast. 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 Arrives with a very significant odor from I think all of the wounds on the left leg and the left foot. Because of this I did not put a total contact cast on him today but will could still consider this. His wife was having cataract surgery which is the reason he missed the appointment 12/6. I saw this man 5 days ago with a swelling below the popliteal fossa. I thought he actually might have a Baker's cyst however the DVT rule out study that we could arrange right away was negative the technician told me this was not a ruptured Baker's cyst. We attempted to get this aspirated by under ultrasound guidance in interventional radiology however all they did was an ultrasound however it shows an extensive fluid collection 62 x 8 x 9.4 in the left thigh and left calf. The patient states he thinks this started 8 days ago or so but he really is not complaining of any pain, fever or systemic symptoms. He has not ha 12/20; after some difficulty I managed to get the patient into see Dr. Donzetta Matters. Eventually he was taken into the hospital and had a drain put in the fluid collection  below his left knee posteriorly extending into the posterior thigh. He still has the drain in  place. Culture of this showed moderate staff aureus few Morganella and few Klebsiella he is now on doxycycline and ciprofloxacin as suggested by infectious disease he is on this for a month. The drain will remain in place until it stops draining 12/29; he comes in today with the 1 wound on his left leg and the area on the left plantar first met head significantly smaller. Both look healthy. He still has the drain in the left leg. He says he has to change this daily. Follows up with Dr. Donzetta Matters on January 11. 06/29/2021; the wounds that I am following on the left leg and left first met head continued to be quite healthy. However the area where his inferior drain is in place had copious amounts of drainage which was green in color. The wound here is larger. Follows up with Dr. Gwenlyn Saran of vein and vascular his surgeon next week as well as infectious disease. He remains on ciprofloxacin and doxycycline. He is not complaining of excessive pain in either one of the drain areas 1/12; the patient saw vascular surgery and infectious disease. Vascular surgery has left the drain in place as there was still some notable drainage still see him back in 2 weeks. Dr. Velna Ochs stop the doxycycline and ciprofloxacin and I do not believe he follows up with them at this point. Culture I did last week showed both doxycycline resistant MRSA and Pseudomonas not sensitive to ciprofloxacin although only in rare titers 1/19; the patient's wound on the left anterior lower leg is just about healed. We have continued healing of the area that was medially on the left leg. Left first plantar metatarsal head continues to get smaller. The major problem here is his 2 drain sites 1 on the left upper calf and lateral thigh. There is purulent drainage still from the left lateral thigh. I gave him antibiotics last week but we still have recultured. He has the drain in the area I think this is eventually going to have to come out. I suspect there  will be a connecting wound to heal here perhaps with improved VAc 1/26; the patient had his drain removed by vein and vascular on 1/25/. This was a large pocket of fluid in his left thigh that seem to tunnel into his left upper calf. 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 His mention his Penrose drain was removed today. He now has a tunneling wound on his left calf and left thigh. Both of these probe widely towards each other although I cannot really prove that they connect. Both wounds on his lower leg anteriorly are closed and his area over the first metatarsal head on his right foot continues to improve. We are using Hydrofera Blue here. He also saw infectious disease culture of the abscess they noted was polymicrobial with MRSA, Morganella and Klebsiella he was treated with doxycycline and ciprofloxacin for 4 weeks ending on 07/03/2021. They did not recommend any further antibiotics. Notable that while he still had the Penrose drain in place last week he had purulent drainage coming out of the inferior IandD site this grew [name], MRSA and Pseudomonas but there does not appear to be any active infection in this area today with the drain out and he is not systemically unwell 2/2; with regards to the drain sites the superior one on the thigh actually is closed down the  one on the upper left lateral calf measures about 8 and half centimeters which is an improvement seems to be less prominent although still with a lot of drainage. The only remaining wound is over the first metatarsal head on the left foot and this looks to be continuing to improve with Hydrofera Blue. 2/9; the area on his plantar left foot continues to contract. Callus around the wound edge. The drain sites specifically have not come down in depth. We put the wound VAC on Monday he changed the canister late last night our intake nurse reported a pocket of fluid perhaps caused by our compression wraps 2/16;  continued improvement in left foot plantar wound. drainage site in the calf is not improved in terms of depth (wound vac) 2/23; continued improvement in the left foot wound over the first metatarsal head. With regards to the drain sites the area on his thigh laterally is healed however the open area on his calf is small in terms of circumference by still probes in by about 15 cm. Within using the wound VAC. Hydrofera Blue on his foot 08/24/2021: The left first metatarsal head wound continues to improve. The wound bed is healthy with just some surrounding callus. Unfortunately the open drain site on his calf remains open and tunnels at least 15 cm (the extent of a Q-tip). This is despite several weeks of wound VAC treatment. Based on reading back through the notes, there has been really no significant change in the depth of the wound, although the orifice is smaller and the more cranial wound on his thigh has closed. I suspect the tunnel tracks nearly all the way to this location. 08/31/2021: Continued improvement in the left first metatarsal head wound. There has been absolutely no improvement to the long tunnel from his open drain site on his calf. We have tried to get him into see vascular surgery sooner to consider the possibility of simply filleting the tract open and allowing it to heal from the bottom up, likely with a wound VAC. They have not yet scheduled a sooner appointment than his current mid April 09/14/2021: He was seen by vascular surgery and they took him to the operating room last week. They opened a portion of the tunnel, but did not extend the entire length of the known open subcutaneous tract. I read Dr. Claretha Cooper operative note and it is not clear from that documentation why only a portion of the tract was opened. The heaped up granulation tissue was curetted and removed from at least some portion of the tract. They did place a wound VAC and applied an Unna boot to the leg. The ulcer on  his left first metatarsal head is smaller today. The bed looks good and there is just a small amount of surrounding callus. 09/21/2021: The ulcer on his left first metatarsal head looks to be stalled. There is some callus surrounding the wound but the wound bed itself does not appear particularly dynamic. The tunnel tract on his lateral left leg seems to be roughly the same length or perhaps slightly smaller but the wound bed appears healthy with good granulation tissue. He opened up a new wound on his medial thigh and the site of a prior surgical incision. He says that he did this unconsciously in his sleep by scratching. 09/28/2021: Unfortunately, the ulcer on his left first metatarsal head has extended underneath the callus toward the dorsum of his foot. The medial thigh wounds are roughly the same. The tunnel on  his lateral left leg continues to be problematic; it is longer than we are able to actually probe with a Q-tip. I am still not certain as to why Dr. Donzetta Matters did not open this up entirely when he took the patient to the operating room. We will likely be back in the same situation with just a small superficial opening in a long unhealed tract, as the open portion is granulating in nicely. 10/02/2021: The patient was initially scheduled for a nurse visit, but we are also applying a total contact cast today. The plantar foot wound looks clean without significant accumulated callus. We have been applying Prisma silver collagen to the site. 10/05/2021: The patient is here for his first total contact cast change. We have tried using gauze packing strips in the tunnel on his lateral leg wound, but this does not seem to be working any better than the white VAC foam. The foot ulcer looks about the same with minimal periwound callus. Medial thigh wound is clean with just some overlying eschar. 10/12/2021: The plantar foot wound is stable without any significant accumulation of periwound callus. The surface is  viable with good granulation tissue. The medial thigh wounds are much smaller and are epithelializing. On the other hand, he had purulent drainage coming from the tunnel on his lateral leg. He does go back to see Dr. Donzetta Matters next week and is planning to ask him why the wound tunnel was not completely opened at the time of his most recent operation. 10/19/2021: The plantar foot wound is markedly improved and has epithelial tissue coming through the surface. The medial thigh wounds are nearly closed with just a tiny open area. He did see Dr. Donzetta Matters earlier this week and apparently they did discuss the possibility of opening the sinus tract further and enabling a wound VAC application. Apparently there are some limits as to what Dr. 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 I think if we could get the tract open to the level of the popliteal fossa, this would greatly aid in her ability to get this chart closed. That being said, however, today when I probed the tract with a Q-tip, I was not able to insert the entirety of the Q-tip as I have on previous occasions. The tunnel is shorter by about 4 cm. The surface is clean with good granulation tissue and no further episodes of purulent drainage. 10/30/2021: Last week, the patient underwent surgery and had the long tract in his leg opened. There was a rind that was debrided, according to the operative report. His medial thigh ulcers are closed. The plantar foot wound is clean with a good surface and some built up surrounding callus. 11/06/2021: The overall dimensions of the large wound on his lateral leg remain about the same, but there is good granulation tissue present and the tunneling is a little bit shorter. He has a new wound on his anterior tibial surface, in the same location where he had a similar lesion in the past. The plantar foot wound is clean with some buildup surrounding callus. Just toward the medial aspect of his  foot, however, there is an area of darkening that once debrided, revealed another opening in the skin surface. 11/13/2021: The anterior tibial surface wound is closed. The plantar foot wound has some surrounding callus buildup. The area of darkening that I debrided last week and revealed an opening in the skin surface has closed again. The tunnel in the large wound on  his lateral leg has come in by about 3 cm. There is healthy granulation tissue on the entire wound surface. 11/23/2021: The patient was out of town last week and did wet-to-dry dressings on his large wound. 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 Unfortunately, he picked open the wound on his left medial thigh. He says that it was itching and he just could not stop scratching it until it was open again. The wound on his plantar foot is smaller and has not accumulated a tremendous amount of callus. The lateral leg wound is shallower and the tunnel has also decreased in depth. There is just a little bit of slough accumulation on the surface. 11/30/2021: Another portion of his left medial thigh has been opened up. All of these wounds are fairly superficial with just a little bit of slough and eschar accumulation. The wound on his plantar foot is almost closed with just a bit of eschar and periwound callus accumulation. The lateral leg wound is nearly flush with the surrounding skin and the tunnel is markedly shallower. 12/07/2021: There is just 1 open area on his left medial thigh. It is clean with just a little bit of perimeter eschar. The wound on his plantar foot continues to contract and just has some eschar and periwound callus accumulation. The lateral leg wound is closing at the more distal aspect and the tunnel is smaller. The surface is nearly flush with the surrounding skin and it has a good bed of granulation tissue. 12/14/2021: The thigh and foot wounds are closed.  The lateral leg wound has closed over approximately half of its length. The tunnel continues to contract and the surface is now flush with the surrounding skin. The wound bed has robust granulation tissue. 12/22/2021: The thigh and foot wounds have reopened. The foot wound has a lot of callus accumulation around and over it. The thigh wound is tiny with just a little bit of slough in the wound bed. The lateral leg wound continues to contract. His vascular surgeon took the wound VAC off earlier in the week and the patient has been doing wet-to-dry dressings. There is a little slough accumulation on the surface. The tunnel is about 3 cm in depth at this point. 12/28/2021: The thigh wound is closed again. The foot wound has some callus that subsequently has peeled back exposing just a small slit of a wound. The lateral leg wound Is down to about half the size that it originally was and the tunnel is down to about half a centimeter in depth. 01/04/2022: The thigh wound remains closed. The foot wound has heavy callus overlying the wound site. Once this was debrided, the wound was found to be closed. The lateral leg wound is smaller again this week and very superficial. No tunnel could be identified. 01/12/2022: The thigh and foot wounds both remain closed. The lateral leg wound is now nearly flush with the skin surface. There is good granulation tissue present with a light layer of slough. 01/19/2022: Due to the way his wrap was placed, the patient did not change the dressing on his thigh at all and so the foam was saturated and his skin is macerated. There is a light layer of slough on the wound surface. The underlying granulation tissue is robust and healthy-appearing. He has heavy callus buildup at the site of his first metatarsal head wound which is still healed. Electronic Signature(s) Signed: 01/19/2022 11:59:35  AM By: Fredirick Maudlin MD FACS Entered By: Fredirick Maudlin on 01/19/2022  11:59:35 -------------------------------------------------------------------------------- Paring/cutting 1 benign hyperkeratotic lesion Details Patient Name: Date of Service: KAMANI, LEWTER 01/19/2022 11:15 A M Medical Record Number: [id number] Patient Account Number: [id number] Date of Birth/Sex: Treating RN: 1951-06-09 (71 y.o. [name] Primary Care Provider: [name] Other Clinician: Referring Provider: Treating Provider/Extender: [name] in Treatment: 40 Procedure Performed for: Non-Wound Location Performed By: Physician Fredirick Maudlin, MD Post Procedure Diagnosis Same as Pre-procedure Notes Lt 1st metatarsal head Electronic Signature(s) Signed: 01/19/2022 12:03:47 PM By: Fredirick Maudlin MD FACS Signed: 01/19/2022 2:55:40 PM By: Baruch Gouty RN, BSN Entered By: Baruch Gouty on 01/19/2022 11:46:08 -------------------------------------------------------------------------------- Physical Exam Details Patient Name: Date of Service: [name]. 01/19/2022 11:15 A M Medical Record Number: [id number] Patient Account Number: [id number] Date of Birth/Sex: Treating RN: 05/09/1951 (71 y.o. M) Primary Care Provider: [name] Other Clinician: Referring Provider: Treating Provider/Extender: [name] in Treatment: 51 Constitutional Hypertensive, asymptomatic. . . . No acute distress.Marland Kitchen Respiratory Normal work of breathing on room air.. Notes 01/19/2022: There was a lot of moisture buildup around his thigh wound and his skin is macerated. There is a light layer of slough on the wound surface. The underlying granulation tissue is robust and healthy-appearing. He has heavy callus buildup at the site of his first metatarsal head wound which is still healed. Electronic Signature(s) Signed: 01/19/2022 12:00:34 PM By: Fredirick Maudlin MD FACS Entered By: Fredirick Maudlin on 01/19/2022  12:00:34 -------------------------------------------------------------------------------- Physician Orders Details Patient Name: Date of Service: [name]. 01/19/2022 11:15 A M Medical Record Number: [id number] Patient Account Number: [id number] Date of Birth/Sex: Treating RN: 11/29/1950 (71 y.o. [name] Primary Care Provider: [name] Other Clinician: Referring Provider: Treating Provider/Extender: [name] in Treatment: [phone] Verbal / Phone Orders: No Diagnosis Coding ICD-10 Coding Code Description 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 I89.0 Lymphedema, not elsewhere classified I87.322 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verity L84 Corns and callosities Follow-up Appointments ppointment in 2 weeks. - Dr Celine Ahr - Room 1 Return A Thursday 8/10 @ 08:15 am Nurse Visit: - Rm 2 Thursday 01/25/2022 @ 1:30 Bathing/ Shower/ Hygiene May shower with protection but do not get wound dressing(s) wet. - Use a cast protector so you can shower without getting your wrap(s) wet Negative Presssure Wound Therapy Wound #22 Left,Lateral Lower Leg Discontinue wound vac Edema Control - Lymphedema / SCD / Other Elevate legs to the level of the heart or above for 30 minutes daily and/or when sitting, a frequency of: - throughout the day Avoid standing for long periods of time. Patient to wear own compression stockings every day. - on right leg; Moisturize legs daily. - Ammonium lactate to right leg daily Wound Treatment Wound #22 - Lower Leg Wound Laterality: Left, Lateral Cleanser: Soap and Water 1 x Per Week/30 Days Discharge Instructions: May shower and wash wound with dial antibacterial soap and water prior to dressing change. Cleanser: Wound Cleanser 1 x Per Week/30  Days Discharge Instructions: Cleanse the wound with wound cleanser prior to applying a clean dressing using gauze sponges, not tissue or cotton balls. Peri-Wound Care: Triamcinolone 15 (g) 1 x Per Week/30 Days Discharge Instructions: Use triamcinolone 15 (g) as directed Peri-Wound Care: Zinc Oxide Ointment 30g tube 1  x Per Week/30 Days Discharge Instructions: Apply Zinc Oxide to periwound with each dressing change Peri-Wound Care: Sween Lotion (Moisturizing lotion) 1 x Per Week/30 Days Discharge Instructions: Apply moisturizing lotion to the leg Prim Dressing: KerraCel Ag Gelling Fiber Dressing, 4x5 in (silver alginate) 1 x Per Week/30 Days ary Discharge Instructions: Apply silver alginate to wound bed as instructed Secondary Dressing: Zetuvit Plus Silicone Border Dressing 4x4 (in/in) 1 x Per Week/30 Days Discharge Instructions: Apply silicone border over primary dressing as directed. Compression Wrap: CoFlex TLC XL 2-layer Compression System 4x7 (in/yd) 1 x Per Week/30 Days Discharge Instructions: Apply CoFlex 2-layer compression as directed. (alt for 4 layer) Electronic Signature(s) Signed: 01/19/2022 12:03:47 PM By: Fredirick Maudlin MD FACS Entered By: Fredirick Maudlin on 01/19/2022 12:01:02 -------------------------------------------------------------------------------- Problem List Details Patient Name: Date of Service: [name]. 01/19/2022 11:15 A M Medical Record Number: [id number] Patient Account Number: [id number] Date of Birth/Sex: Treating RN: [birth year] (72 y.o. [name] Primary Care Provider: [name] Other Clinician: Referring Provider: Treating Provider/Extender: [name] in Treatment: 46 Active Problems ICD-10 Encounter Code Description Active Date MDM Diagnosis 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 04/12/2021 No Yes I89.0 Lymphedema, not elsewhere classified 04/12/2021 No  Yes I87.322 Chronic venous hypertension (idiopathic) with inflammation of left lower 04/12/2021 No Yes extremity 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 ulcer of other part of left lower leg with other specified 04/12/2021 No Yes severity Inactive Problems ICD-10 Code Description Active Date Inactive Date E11.621 Type 2 diabetes mellitus with foot ulcer 04/12/2021 04/12/2021 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 of other part of left foot with other specified severity 04/12/2021 04/12/2021 E11.42 Type 2 diabetes mellitus with diabetic polyneuropathy 04/12/2021 04/12/2021 L02.416 Cutaneous abscess of left lower limb 06/13/2021 06/13/2021 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 07/20/2021 07/20/2021 Resolved Problems Electronic Signature(s) Signed: 01/19/2022 11:58:02 AM By: Fredirick Maudlin MD FACS Entered By: Fredirick Maudlin on 01/19/2022 11:58:02 -------------------------------------------------------------------------------- Progress Note Details Patient Name: Date of Service: [name]. 01/19/2022 11:15 A M Medical Record Number: [id number] Patient Account Number: [id number] Date of Birth/Sex: Treating RN: Nov 30, 1950 (71 y.o. M) Primary Care Provider: [name] Other Clinician: Referring Provider: Treating Provider/Extender: [name] in Treatment: 64 Subjective Chief Complaint Information obtained from Patient Left leg and foot ulcers 04/12/2021; patient is here for wounds on his left lower leg and left plantar foot over the first metatarsal head History of Present Illness (HPI) 10/11/17; Mr. Dahlem is a 71 year old man who tells me that in 2015 he slipped down the latter traumatizing his left leg. He developed a wound in the same spot the area that we are currently looking at. He states this closed over for the most part although he always felt it was somewhat unstable. In 2016 he hit the same area with the door of his  car had this reopened. He tells me that this is never really closed although sometimes an inflow it remains open on a constant basis. He has not been using any specific dressing to this except for topical antibiotics the nature of which were not really sure. His primary doctor did send him to see Dr. [name] of interventional cardiology. He underwent an angiogram on 08/06/17 and he underwent a PTA and directional atherectomy of the lesser distal SFA and popliteal arteries which resulted in brisk improvement in blood flow. It was noted that he had 2 vessel runoff through the anterior tibial and peroneal. He  is also been to see vascular and interventional radiologist. He was not felt to have any significant superficial venous insufficiency. Presumably is not a candidate for any ablation. It was suggested he come here for wound care. The patient is a type II diabetic on insulin. He also has a history of venous insufficiency. ABIs on the left were noncompressible in our clinic 10/21/17; patient we admitted to the clinic last week. 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 We put Iodosorb on him after an aggressive debridement and 3 layer compression. He complained of pain in his ankle and itching with is skin in fact he scratched the area on the medial calf superiorly at the rim of our wraps and he has 2 small open areas in that location today which are new. I changed his primary dressing today to silver collagen. As noted he is already had revascularization and does not have any significant superficial venous insufficiency that would be amenable to ablation 10/28/17; patient admitted to the clinic 2 weeks ago. He has a smaller Wound. Scratch injury from last week revealed. There is large wound over the tibial area. This is smaller. Granulation looks healthy. No need for debridement. 11/04/17; the wound on the left lateral calf looks better.  Improved dimensions. Surface of this looks better. We've been maintaining him and Kerlix Coban wraps. He finds this much more comfortable. Silver collagen dressing 11/11/17; left lateral Wound continues to look healthy be making progress. Using a #5 curet I removed removed nonviable skin from the surface of the wound and then necrotic debris from the wound surface. Surface of the wound continues to look healthy. ooHe also has an open area on the left great toenail bed. We've been using topical antibiotics. 11/19/17; left anterior lateral wound continues to look healthy but it's not closed. ooHe also had a small wound above this on the left leg ooInitially traumatic wounds in the setting of significant chronic venous insufficiency and stasis dermatitis 11/25/17; left anterior wounds superiorly is closed still a small wound inferiorly. 12/02/17; left anterior tibial area. Arrives today with adherent callus. Post debridement clearly not completely closed. Hydrofera Blue under 3 layer compression. 12/09/17; left anterior tibia. Circumferential eschar however the wound bed looks stable to improved. We've been using Hydrofera Blue under 3 layer compression 12/17/17; left anterior tibia. Apparently this was felt to be closed however when the wrap was taken off there is a skin tear to reopen wounds in the same area we've been using Hydrofera Blue under 3 layer compression 12/23/17 left anterior tibia. Not close to close this week apparently the [facility] was stuck to this again. Still circumferential eschar requiring debridement. I put a contact layer on this this time under the Hydrofera Blue 12/31/17; left anterior tibia. Wound is better slight amount of hyper-granulation. Using Hydrofera Blue over Adaptic. 01/07/18; left anterior tibia. The wound had some surface eschar however after this was removed he has no open wound.he was already revascularized by Dr. [name] when he came to our clinic with atherectomy  of the left SFA and popliteal artery. He was also sent to interventional radiology for venous reflux studies. He was not felt to have significant reflux but certainly has chronic venous changes of his skin with hemosiderin deposition around this area. He will definitely need to lubricate his skin and wear compression stocking and I've talked to him about this. READMISSION 05/26/2018 This is a now 71 year old  man we cared for with traumatic wounds on his left anterior lower extremity. He had been previously revascularized during that admission by Dr. [name]. Apparently in follow-up Dr. [name] noted that he had deterioration in his arterial status. He underwent a stent placement in the distal left SFA on 04/22/2018. 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 He went back to the angiography suite on 04/30/2018 he underwent PTA and balloon angioplasty of the occluded left mid anterior tibial artery, thrombotic occlusion went from 100 to 0% which reconstitutes the posterior tibial artery. He had thrombectomy and aspiration of the peroneal artery. The stent placed in the distal SFA left SFA was still occluded. 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 He has been recuperating at [facility] in [facility] after the most recent hospitalization. He is going home tomorrow. He tells me that 2 weeks ago he traumatized the tip of his left fifth toe. He came in urgently for our review of this. This was a history of before I noted that Dr. [name] had already noted dry gangrenous changes of the left fifth toe 06/09/2018; 2-week follow-up. I did contact Dr. [name] after his last appointment and he apparently saw 1 of Dr. Irven Shelling colleagues the next  day. He does not follow-up with Dr. [name] himself until Thursday of this week. 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 Nevertheless there is no evidence of infection no drainage and no pain. He had a new area that this week when we were signing him in today on the left anterior mid tibia area, this is in close proximity to the previous wound we have dealt with in this clinic. 06/23/2018; 2-week follow-up. I did not receive a recent note from Dr. [name] to review today. Our office is trying to obtain this. He is apparently not planning to do further vascular interventions and wondered about compression to try and help with the patient's chronic venous insufficiency. However we are also concerned about the arterial flow. ooHe arrives in clinic today with a new area on the left third toe. The areas on the calf/anterior tibia are close to closing. The left fifth toe is still mummified using Betadine. -In reviewing things with the patient he has what sounds like claudication with mild to moderate amount of activity. 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 I prescribed Levaquin over the phone while we attempted to arrange a plan of care. However the patient called yesterday to report he had low-grade fever and he came in today acutely. There is been a marked deterioration in the left third toe with spreading cellulitis up into the dorsal left foot. He was referred to the emergency room. Readmission: 06/29/2020 patient presents today for reevaluation here in our clinic he was previously treated by Dr. Dellia Nims at the latter part of 2019 in 2 the beginning of 2020. Subsequently we have not seen him since that time in the interim he did have evaluation with vein and vascular specialist specifically Dr. Anice Paganini who did perform quite extensive work for a left femoral to anterior tibial artery bypass. With that being said in the interim the patient has  developed significant lymphedema and  has wounds that he tells me have really never healed in regard to the incision site on the left leg. He also has multiple wounds on the feet for various reasons some of which is that he tends to pick at his feet. Fortunately there is no signs of active infection systemically at this time he does have some wounds that are little bit deeper but most are fairly superficial he seems to have good blood flow and overall everything appears to be healthy I see no bone exposed and no obvious signs of osteomyelitis. I do not know that he necessarily needs a x-ray at this point although that something we could consider depending on how things progress. The patient does have a history of lymphedema, diabetes, this is type II, chronic kidney disease stage III, hypertension, and history of peripheral vascular disease. 07/05/2020; patient admitted last week. Is a patient I remember from 2019 he had a spreading infection involving the left foot and we sent him to the hospital. 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 He subsequently had a left femoral to anterior tibial bypass by Dr.[facility] vein and vascular. He also has severe lymphedema with chronic skin changes related to that on the left leg. The most problematic area that was new today was on the left medial great toe. This was apparently a small area last week there was purulent drainage which our intake nurse cultured. Also areas on the left medial foot and heel left lateral foot. He has 2 areas on the left medial calf left lateral calf in the setting of the severe lymphedema. 07/13/2020 on evaluation today patient appears to be doing better in my opinion compared to his last visit. The good news is there is no signs of active infection systemically and locally I do not see any signs of infection either. He did have an x-ray which was negative that is great news he had a culture which  showed MRSA but at the same time he is been on the doxycycline which has helped. I do think we may want to extend this for 7 additional days 1/25; patient admitted to the clinic a few weeks ago. 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 We have been putting him under compression his edema control is a lot better but he is severe verricused skin on the left leg. He is really done quite well he still has an open area on the left medial calf and the left medial first metatarsal head. We have been using silver collagen on the leg silver alginate on the foot 07/27/2020 upon evaluation today patient appears to be doing decently well in regard to his wounds. He still has a lot of dry skin on the left leg. Some of this is starting to peel back and I think he may be able to have them out by removing some that today. Fortunately there is no signs of active infection at this time on the left leg although on the right leg he does appear to have swelling and erythema as well as some mild warmth to touch. This does have been concerned about the possibility of cellulitis although within the differential diagnosis I do think that potentially a DVT has to be at least considered. We need to rule that out before proceeding would just call in the cellulitis. Especially since he is having pain in the posterior aspect of his calf muscle.  2/8; the patient had seen sparingly. He has severe skin changes of chronic lymphedema in the left leg thickened hyperkeratotic verrucous skin. He has an open wound on the medial part of the left first met head left mid tibia. He also has a rim of nonepithelialized skin in the anterior mid tibia. He brought in the AmLactin lotion that was been prescribed although I am not sure under compression and its utility. There concern about cellulitis on the right lower leg the last time he was here. He was put on on antibiotics. His DVT rule out was  negative. The right leg looks fine he is using his stocking on this area 08/10/2020 upon evaluation today patient appears to be doing well with regard to his leg currently. He has been tolerating the dressing changes without complication. Fortunately there is no signs of active infection which is great news. Overall very pleased with where things stand. 2/22; the patient still has an area on the medial part of the left first met his head. This looks better than when I last saw this earlier this month he has a rim of epithelialization but still some surface debris. Mostly everything on the left leg is healed. There is still a vulnerable in the left mid tibia area. 08/30/2020 upon evaluation today patient appears to be doing much better in regard to his wounds on his foot. 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 Nonetheless I think we will address that today I Minna send in a prescription for him in that regard. Overall though there does not appear to be any signs of significant worsening. 09/07/2020 on evaluation today patient's wounds over his left foot appear to be doing excellent. I do not see any signs of infection there is some callus buildup this can require debridement for certain but overall I feel like he is managing quite nicely. He still using the AmLactin cream which has been beneficial for him as well. 3/22; left foot wound is closed. There is no open area here. He is using ammonium lactate lotion to the lower extremities to help exfoliate dry cracked skin. He has compression stockings from elastic therapy in [place]. The wound on the medial part of his left first met head is healed today. READMISSION 04/12/2021 Mr. Minton is a patient we know fairly well he had a prolonged stay in clinic in 2019 with wounds on his left lateral and left anterior lower  extremity in the setting of chronic venous insufficiency. More recently he was here earlier this year with predominantly an area on his left foot first metatarsal head plantar and he says the plantar foot broke down on its not long after we discharged him but he did not come back here. The last few months areas of broken down on his left anterior and again the left lateral lower extremity. The leg itself is very swollen chronically enlarged a lot of hyperkeratotic dry Berry Q skin in the left lower leg. His edema extends well into the thigh. He was seen by Dr. Donzetta Matters. He had ABIs on 03/02/2021 showing an ABI on the right of 1 with a TBI of 0.72 his ABI in the left at 1.09 TBI of 0.99. Monophasic and biphasic waveforms on the right. On the left monophasic waveforms were noted he went on to have an angiogram on 03/27/2021 this showed the aortic  aortic and iliac segments were free of flow-limiting stenosis the left common femoral vein to evaluate the left femoral to anterior tibial artery bypass was unobstructed the bypass was patent without any areas of stenosis. We discharged the patient in bilateral juxta lite stockings but very clearly that was not sufficient to control the swelling and maintain skin integrity. He is clearly going to need compression pumps. 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 This is fortunate because he is on his feet for long periods of time. 10/27; patient comes in with our intake nurse reporting copious amount of green drainage from the left anterior mid tibia the left dorsal foot and to a lesser extent the left medial mid tibia. We left the compression wrap on all week for the amount of edema in his left leg is quite a bit better. We use silver alginate as the primary dressing 11/3; edema control is good. Left anterior lower leg left medial lower leg and the plantar first metatarsal head. The left anterior lower leg required  debridement. Deep tissue culture I did of this wound showed MRSA I put him on 10 days of doxycycline which she will start today. We have him in compression wraps. He has a security card and [employer] however he is retiring on November 15. We will need to then get him into a better offloading boot for the left foot perhaps a total contact cast 11/10; edema control is quite good. Left anterior and left medial lower leg wounds in the setting of chronic venous insufficiency and lymphedema. He also has a substantial area over the left plantar first metatarsal head. 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 He has significant hypergranulation on the left plantar foot wound. The patient is a diabetic but he does not have significant PAD 11/17; edema control is quite good. Left anterior and left medial lower leg wounds look better. The really concerning area remains the area on the left plantar first metatarsal head. He has a rim of epithelialization. 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 Starting today with a forefoot off loader but . possibly a total contact cast. 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 Arrives with a very significant odor from I think all of the wounds on the left leg and the left foot. Because of this I did not put a total contact cast on him today but will could still consider this. His wife was having cataract surgery which is the reason he missed the appointment 12/6. I saw this man 5 days ago with a swelling below the popliteal fossa. I thought he actually might have a Baker's cyst however the DVT rule out study that we could arrange right away was negative the  technician told me this was not a ruptured Baker's cyst. We attempted to get this aspirated by under ultrasound guidance in interventional radiology however all they did was an ultrasound however it shows an extensive fluid collection 62 x 8 x 9.4 in the left thigh and left calf. The patient states he thinks this  started 8 days ago or so but he really is not complaining of any pain, fever or systemic symptoms. He has not ha 12/20; after some difficulty I managed to get the patient into see Dr. Donzetta Matters. Eventually he was taken into the hospital and had a drain put in the fluid collection below his left knee posteriorly extending into the posterior thigh. He still has the drain in place. Culture of this showed moderate staff aureus few Morganella and few Klebsiella he is now on doxycycline and ciprofloxacin as suggested by infectious disease he is on this for a month. The drain will remain in place until it stops draining 12/29; he comes in today with the 1 wound on his left leg and the area on the left plantar first met head significantly smaller. Both look healthy. He still has the drain in the left leg. He says he has to change this daily. Follows up with Dr. Donzetta Matters on January 11. 06/29/2021; the wounds that I am following on the left leg and left first met head continued to be quite healthy. However the area where his inferior drain is in place had copious amounts of drainage which was green in color. The wound here is larger. Follows up with Dr. Gwenlyn Saran of vein and vascular his surgeon next week as well as infectious disease. He remains on ciprofloxacin and doxycycline. He is not complaining of excessive pain in either one of the drain areas 1/12; the patient saw vascular surgery and infectious disease. Vascular surgery has left the drain in place as there was still some notable drainage still see him back in 2 weeks. Dr. Velna Ochs stop the doxycycline and ciprofloxacin and I do not believe he follows up with  them at this point. Culture I did last week showed both doxycycline resistant MRSA and Pseudomonas not sensitive to ciprofloxacin although only in rare titers 1/19; the patient's wound on the left anterior lower leg is just about healed. We have continued healing of the area that was medially on the left leg. Left first plantar metatarsal head continues to get smaller. The major problem here is his 2 drain sites 1 on the left upper calf and lateral thigh. There is purulent drainage still from the left lateral thigh. I gave him antibiotics last week but we still have recultured. He has the drain in the area I think this is eventually going to have to come out. I suspect there will be a connecting wound to heal here perhaps with improved VAc 1/26; the patient had his drain removed by vein and vascular on 1/25/. This was a large pocket of fluid in his left thigh that seem to tunnel into his left upper calf. 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 His mention his Penrose drain was removed today. He now has a tunneling wound on his left calf and left thigh. Both of these probe widely towards each other although I cannot really prove that they connect. Both wounds on his lower leg anteriorly are closed and his area over the first metatarsal head on his right foot continues to improve. We are using Hydrofera Blue here. He also saw infectious disease culture of the abscess they noted was polymicrobial with MRSA, Morganella and Klebsiella he was treated with doxycycline and ciprofloxacin for 4 weeks ending on 07/03/2021. They did not recommend any further antibiotics. Notable that while he still had the Penrose drain in place last week he had purulent drainage coming out of the  inferior IandD site this grew [facility], MRSA and Pseudomonas but there does not appear to be any active infection in this area today with the drain out and he is not systemically unwell 2/2; with regards to the  drain sites the superior one on the thigh actually is closed down the one on the upper left lateral calf measures about 8 and half centimeters which is an improvement seems to be less prominent although still with a lot of drainage. The only remaining wound is over the first metatarsal head on the left foot and this looks to be continuing to improve with Hydrofera Blue. 2/9; the area on his plantar left foot continues to contract. Callus around the wound edge. The drain sites specifically have not come down in depth. We put the wound VAC on Monday he changed the canister late last night our intake nurse reported a pocket of fluid perhaps caused by our compression wraps 2/16; continued improvement in left foot plantar wound. drainage site in the calf is not improved in terms of depth (wound vac) 2/23; continued improvement in the left foot wound over the first metatarsal head. With regards to the drain sites the area on his thigh laterally is healed however the open area on his calf is small in terms of circumference by still probes in by about 15 cm. Within using the wound VAC. Hydrofera Blue on his foot 08/24/2021: The left first metatarsal head wound continues to improve. The wound bed is healthy with just some surrounding callus. Unfortunately the open drain site on his calf remains open and tunnels at least 15 cm (the extent of a Q-tip). This is despite several weeks of wound VAC treatment. Based on reading back through the notes, there has been really no significant change in the depth of the wound, although the orifice is smaller and the more cranial wound on his thigh has closed. I suspect the tunnel tracks nearly all the way to this location. 08/31/2021: Continued improvement in the left first metatarsal head wound. There has been absolutely no improvement to the long tunnel from his open drain site on his calf. We have tried to get him into see vascular surgery sooner to consider the possibility  of simply filleting the tract open and allowing it to heal from the bottom up, likely with a wound VAC. They have not yet scheduled a sooner appointment than his current mid April 09/14/2021: He was seen by vascular surgery and they took him to the operating room last week. They opened a portion of the tunnel, but did not extend the entire length of the known open subcutaneous tract. I read Dr. Claretha Cooper operative note and it is not clear from that documentation why only a portion of the tract was opened. The heaped up granulation tissue was curetted and removed from at least some portion of the tract. They did place a wound VAC and applied an Unna boot to the leg. The ulcer on his left first metatarsal head is smaller today. The bed looks good and there is just a small amount of surrounding callus. 09/21/2021: The ulcer on his left first metatarsal head looks to be stalled. There is some callus surrounding the wound but the wound bed itself does not appear particularly dynamic. The tunnel tract on his lateral left leg seems to be roughly the same length or perhaps slightly smaller but the wound bed appears healthy with good granulation tissue. He opened up a new wound on his medial thigh  and the site of a prior surgical incision. He says that he did this unconsciously in his sleep by scratching. 09/28/2021: Unfortunately, the ulcer on his left first metatarsal head has extended underneath the callus toward the dorsum of his foot. The medial thigh wounds are roughly the same. The tunnel on his lateral left leg continues to be problematic; it is longer than we are able to actually probe with a Q-tip. I am still not certain as to why Dr. Donzetta Matters did not open this up entirely when he took the patient to the operating room. We will likely be back in the same situation with just a small superficial opening in a long unhealed tract, as the open portion is granulating in nicely. 10/02/2021: The patient was initially  scheduled for a nurse visit, but we are also applying a total contact cast today. The plantar foot wound looks clean without significant accumulated callus. We have been applying Prisma silver collagen to the site. 10/05/2021: The patient is here for his first total contact cast change. We have tried using gauze packing strips in the tunnel on his lateral leg wound, but this does not seem to be working any better than the white VAC foam. The foot ulcer looks about the same with minimal periwound callus. Medial thigh wound is clean with just some overlying eschar. 10/12/2021: The plantar foot wound is stable without any significant accumulation of periwound callus. The surface is viable with good granulation tissue. The medial thigh wounds are much smaller and are epithelializing. On the other hand, he had purulent drainage coming from the tunnel on his lateral leg. He does go back to see Dr. Donzetta Matters next week and is planning to ask him why the wound tunnel was not completely opened at the time of his most recent operation. 10/19/2021: The plantar foot wound is markedly improved and has epithelial tissue coming through the surface. The medial thigh wounds are nearly closed with just a tiny open area. He did see Dr. Donzetta Matters earlier this week and apparently they did discuss the possibility of opening the sinus tract further and enabling a wound VAC application. Apparently there are some limits as to what Dr. 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 I think if we could get the tract open to the level of the popliteal fossa, this would greatly aid in her ability to get this chart closed. That being said, however, today when I probed the tract with a Q-tip, I was not able to insert the entirety of the Q-tip as I have on previous occasions. The tunnel is shorter by about 4 cm. The surface is clean with good granulation tissue and no further episodes of purulent  drainage. 10/30/2021: Last week, the patient underwent surgery and had the long tract in his leg opened. There was a rind that was debrided, according to the operative report. His medial thigh ulcers are closed. The plantar foot wound is clean with a good surface and some built up surrounding callus. 11/06/2021: The overall dimensions of the large wound on his lateral leg remain about the same, but there is good granulation tissue present and the tunneling is a little bit shorter. He has a new wound on his anterior tibial surface, in the same location where he had a similar lesion in the past. The plantar foot wound is clean with some buildup surrounding callus. Just toward the medial aspect of his foot, however, there is an area of darkening that once  debrided, revealed another opening in the skin surface. 11/13/2021: The anterior tibial surface wound is closed. The plantar foot wound has some surrounding callus buildup. The area of darkening that I debrided last week and revealed an opening in the skin surface has closed again. The tunnel in the large wound on his lateral leg has come in by about 3 cm. There is healthy granulation tissue on the entire wound surface. 11/23/2021: The patient was out of town last week and did wet-to-dry dressings on his large wound. 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 Unfortunately, he picked open the wound on his left medial thigh. He says that it was itching and he just could not stop scratching it until it was open again. The wound on his plantar foot is smaller and has not accumulated a tremendous amount of callus. The lateral leg wound is shallower and the tunnel has also decreased in depth. There is just a little bit of slough accumulation on the surface. 11/30/2021: Another portion of his left medial thigh has been opened up. All of these wounds are fairly superficial with just a little bit of  slough and eschar accumulation. The wound on his plantar foot is almost closed with just a bit of eschar and periwound callus accumulation. The lateral leg wound is nearly flush with the surrounding skin and the tunnel is markedly shallower. 12/07/2021: There is just 1 open area on his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

## 2022-01-19 NOTE — Progress Notes (Signed)
TROYE, HIEMSTRA ([id number]) Visit Report for 01/19/2022 Arrival Information Details Patient Name: Date of Service: [name], [name] 01/19/2022 11:15 A M Medical Record Number: [id number] Patient Account Number: [id number] Date of Birth/Sex: Treating RN: 1951-06-03 (71 y.o. [name] Primary Care Latesia Norrington: [name] Other Clinician: Referring Donella Pascarella: Treating Karmin Kasprzak/Extender: [name] in Treatment: 36 Visit Information History Since Last Visit All ordered tests and consults were completed: Yes Patient Arrived: Ambulatory Added or deleted any medications: No Arrival Time: 11:20 Any new allergies or adverse reactions: No Accompanied By: son Had a fall or experienced change in No Transfer Assistance: None activities of daily living that may affect Patient Requires Transmission-Based Precautions: No risk of falls: Patient Has Alerts: Yes Signs or symptoms of abuse/neglect since last visito No Hospitalized since last visit: No Implantable device outside of the clinic excluding No cellular tissue based products placed in the center since last visit: Has Dressing in Place as Prescribed: Yes Has Compression in Place as Prescribed: Yes Pain Present Now: No Electronic Signature(s) Signed: 01/19/2022 2:55:40 PM By: Baruch Gouty RN, BSN Entered By: Baruch Gouty on 01/19/2022 11:22:06 -------------------------------------------------------------------------------- Compression Therapy Details Patient Name: Date of Service: [name]. 01/19/2022 11:15 A M Medical Record Number: [id number] Patient Account Number: [id number] Date of Birth/Sex: Treating RN: [birth year] (71 y.o. M) Primary Care Saraiya Kozma: [name] Other Clinician: Referring Milka Windholz: Treating Camary Sosa/Extender: [name] in Treatment: 40 Compression Therapy Performed for Wound Assessment: Wound #22 Left,Lateral Lower Leg Performed By: Clinician  Baruch Gouty, RN Compression Type: Double Layer Post Procedure Diagnosis Same as Pre-procedure Electronic Signature(s) Signed: 01/19/2022 5:28:07 PM By: Blanche East RN Entered By: Blanche East on 01/19/2022 17:26:59 -------------------------------------------------------------------------------- Encounter Discharge Information Details Patient Name: Date of Service: [name]. 01/19/2022 11:15 A M Medical Record Number: [id number] Patient Account Number: [id number] Date of Birth/Sex: Treating RN: 11/15/1950 (71 y.o. [name] Primary Care Corneilus Heggie: [name] Other Clinician: Referring Jerrad Mendibles: Treating Zira Helinski/Extender: [name] in Treatment: 42 Encounter Discharge Information Items Post Procedure Vitals Discharge Condition: Stable Temperature (F): 98.6 Ambulatory Status: Ambulatory Pulse (bpm): 63 Discharge Destination: Home Respiratory Rate (breaths/min): 18 Transportation: Private Auto Blood Pressure (mmHg): 180/73 Accompanied By: son Schedule Follow-up Appointment: Yes Clinical Summary of Care: Electronic Signature(s) Signed: 01/19/2022 2:55:40 PM By: Baruch Gouty RN, BSN Entered By: Baruch Gouty on 01/19/2022 11:56:28 -------------------------------------------------------------------------------- Lower Extremity Assessment Details Patient Name: Date of Service: [name]. 01/19/2022 11:15 A M Medical Record Number: [id number] Patient Account Number: [id number] Date of Birth/Sex: Treating RN: Jun 14, 1951 (71 y.o. [name] Primary Care Donshay Lupinski: [name] Other Clinician: Referring Jersi Mcmaster: Treating Dorothia Passmore/Extender: [name] in Treatment: 40 Edema Assessment Assessed: [name]: No] [Right: No] Edema: [Left: Ye] [Right: s] Calf Left: Right: Point of Measurement: 41 cm From Medial Instep 43.5 cm Ankle Left: Right: Point of Measurement: 10 cm From Medial Instep  27.3 cm Vascular Assessment Pulses: Dorsalis Pedis Palpable: [Left:Yes] Electronic Signature(s) Signed: 01/19/2022 2:55:40 PM By: Baruch Gouty RN, BSN Entered By: Baruch Gouty on 01/19/2022 11:30:58 -------------------------------------------------------------------------------- Multi Wound Chart Details Patient Name: Date of Service: [name]. 01/19/2022 11:15 A M Medical Record Number: [id number] Patient Account Number: [id number] Date of Birth/Sex: Treating RN: August 28, 1950 (71 y.o. M) Primary Care Ved Martos: [name] Other Clinician: Referring Aneliese Beaudry: Treating Totiana Everson/Extender: [name] in Treatment: 40 Vital Signs Height(in): 74 Capillary Blood Glucose(mg/dl): 128 Weight(lbs): 238 Pulse(bpm): 73 Body  Mass Index(BMI): 30.6 Blood Pressure(mmHg): 180/73 Temperature(F): 98.6 Respiratory Rate(breaths/min): 18 Photos: [N/A:N/A] Left, Lateral Lower Leg N/A N/A Wound Location: Bump N/A N/A Wounding Event: Cyst N/A N/A Primary Etiology: Glaucoma, Sleep Apnea, N/A N/A Comorbid History: Hypertension, Peripheral Arterial Disease, Peripheral Venous Disease, Type II Diabetes, Gout, Osteoarthritis, Neuropathy 06/03/2021 N/A N/A Date Acquired: 69 N/A N/A Weeks of Treatment: Open N/A N/A Wound Status: No N/A N/A Wound Recurrence: 9.5x1.8x0.1 N/A N/A Measurements L x W x D (cm) 13.43 N/A N/A A (cm) : rea 1.343 N/A N/A Volume (cm) : -714.40% N/A N/A % Reduction in A rea: -1.80% N/A N/A % Reduction in Volume: Full Thickness With Exposed Support N/A N/A Classification: Structures Medium N/A N/A Exudate A mount: Serosanguineous N/A N/A Exudate Type: red, brown N/A N/A Exudate Color: Fibrotic scar, thickened scar N/A N/A Wound Margin: Large (67-100%) N/A N/A Granulation A mount: Red N/A N/A Granulation Quality: Small (1-33%) N/A N/A Necrotic A mount: Fat Layer (Subcutaneous Tissue): Yes N/A N/A Exposed  Structures: Fascia: No Tendon: No Muscle: No Joint: No Bone: No Small (1-33%) N/A N/A Epithelialization: Debridement - Selective/Open Wound N/A N/A Debridement: Pre-procedure Verification/Time Out 11:42 N/A N/A Taken: Slough N/A N/A Tissue Debrided: Non-Viable Tissue N/A N/A Level: 17.1 N/A N/A Debridement A (sq cm): rea Curette N/A N/A Instrument: Minimum N/A N/A Bleeding: Pressure N/A N/A Hemostasis A chieved: 0 N/A N/A Procedural Pain: 0 N/A N/A Post Procedural Pain: Procedure was tolerated well N/A N/A Debridement Treatment Response: 9.5x1.8x0.1 N/A N/A Post Debridement Measurements L x W x D (cm) 1.343 N/A N/A Post Debridement Volume: (cm) Debridement N/A N/A Procedures Performed: Treatment Notes Wound #22 (Lower Leg) Wound Laterality: Left, Lateral Cleanser Soap and Water Discharge Instruction: May shower and wash wound with dial antibacterial soap and water prior to dressing change. Wound Cleanser Discharge Instruction: Cleanse the wound with wound cleanser prior to applying a clean dressing using gauze sponges, not tissue or cotton balls. Peri-Wound Care Triamcinolone 15 (g) Discharge Instruction: Use triamcinolone 15 (g) as directed Zinc Oxide Ointment 30g tube Discharge Instruction: Apply Zinc Oxide to periwound with each dressing change Sween Lotion (Moisturizing lotion) Discharge Instruction: Apply moisturizing lotion to the leg Topical Primary Dressing KerraCel Ag Gelling Fiber Dressing, 4x5 in (silver alginate) Discharge Instruction: Apply silver alginate to wound bed as instructed Secondary Dressing Zetuvit Plus Silicone Border Dressing 4x4 (in/in) Discharge Instruction: Apply silicone border over primary dressing as directed. Secured With Compression Wrap CoFlex TLC XL 2-layer Compression System 4x7 (in/yd) Discharge Instruction: Apply CoFlex 2-layer compression as directed. (alt for 4 layer) Compression Stockings Add-Ons Electronic  Signature(s) Signed: 01/19/2022 11:58:10 AM By: Fredirick Maudlin MD FACS Entered By: Fredirick Maudlin on 01/19/2022 11:58:10 -------------------------------------------------------------------------------- Multi-Disciplinary Care Plan Details Patient Name: Date of Service: [name]. 01/19/2022 11:15 A M Medical Record Number: [id number] Patient Account Number: [id number] Date of Birth/Sex: Treating RN: 05/21/1951 (71 y.o. [name] Primary Care Decari Duggar: [name] Other Clinician: Referring Mikeala Girdler: Treating Cay Kath/Extender: [name] in Treatment: 35 Multidisciplinary Care Plan reviewed with physician Active Inactive Venous Leg Ulcer Nursing Diagnoses: Knowledge deficit related to disease process and management Potential for venous Insuffiency (use before diagnosis confirmed) Goals: Patient will maintain optimal edema control Date Initiated: 07/27/2021 Target Resolution Date: 02/16/2022 Goal Status: Active Interventions: Assess peripheral edema status every visit. Treatment Activities: Therapeutic compression applied : 07/27/2021 Notes: Wound/Skin Impairment Nursing Diagnoses: Impaired tissue integrity Knowledge deficit related to ulceration/compromised skin integrity Goals: Patient will have a decrease in wound  volume by X% from date: (specify in notes) Date Initiated: 04/12/2021 Date Inactivated: 01/04/2022 Target Resolution Date: 04/23/2021 Goal Status: Met Patient/caregiver will verbalize understanding of skin care regimen Date Initiated: 01/04/2022 Target Resolution Date: 02/01/2022 Goal Status: Active Ulcer/skin breakdown will have a volume reduction of 30% by week 4 Date Initiated: 04/12/2021 Date Inactivated: 04/27/2021 Target Resolution Date: 04/27/2021 Goal Status: Unmet Unmet Reason: infection Ulcer/skin breakdown will have a volume reduction of 50% by week 8 Date Initiated: 04/27/2021 Date Inactivated:  06/29/2021 Target Resolution Date: 06/24/2021 Goal Status: Met Interventions: Assess patient/caregiver ability to obtain necessary supplies Assess patient/caregiver ability to perform ulcer/skin care regimen upon admission and as needed Assess ulceration(s) every visit Notes: Electronic Signature(s) Signed: 01/19/2022 2:55:40 PM By: Baruch Gouty RN, BSN Entered By: Baruch Gouty on 01/19/2022 11:34:06 -------------------------------------------------------------------------------- Pain Assessment Details Patient Name: Date of Service: [name]. 01/19/2022 11:15 A M Medical Record Number: [id number] Patient Account Number: [id number] Date of Birth/Sex: Treating RN: [birth year] (71 y.o. [name] Primary Care Theoplis Garciagarcia: [name] Other Clinician: Referring Laurice Iglesia: Treating Twylah Bennetts/Extender: [name] in Treatment: 40 Active Problems Location of Pain Severity and Description of Pain Patient Has Paino No Site Locations Rate the pain. Rate the pain. Current Pain Level: 0 Pain Management and Medication Current Pain Management: Electronic Signature(s) Signed: 01/19/2022 2:55:40 PM By: Baruch Gouty RN, BSN Entered By: Baruch Gouty on 01/19/2022 11:23:09 -------------------------------------------------------------------------------- Patient/Caregiver Education Details Patient Name: Date of Service: [place], [name] 7/28/2023andnbsp11:15 A M Medical Record Number: [id number] Patient Account Number: [id number] Date of Birth/Gender: Treating RN: 07-21-1950 (71 y.o. [name] Primary Care Physician: [name] Other Clinician: Referring Physician: Treating Physician/Extender: [name] in Treatment: 56 Education Assessment Education Provided To: Patient Education Topics Provided Venous: Methods: Explain/Verbal Responses: Reinforcements needed, State content correctly Wound/Skin  Impairment: Methods: Explain/Verbal Responses: Reinforcements needed, State content correctly Electronic Signature(s) Signed: 01/19/2022 2:55:40 PM By: Baruch Gouty RN, BSN Entered By: Baruch Gouty on 01/19/2022 11:34:34 -------------------------------------------------------------------------------- Wound Assessment Details Patient Name: Date of Service: [name]. 01/19/2022 11:15 A M Medical Record Number: [id number] Patient Account Number: [id number] Date of Birth/Sex: Treating RN: 09-26-1950 (71 y.o. [name] Primary Care Mistee Soliman: [name] Other Clinician: Referring Keithon Mccoin: Treating Cresencio Reesor/Extender: [name] in Treatment: 29 Wound Status Wound Number: 22 Primary Cyst Etiology: Wound Location: Left, Lateral Lower Leg Wound Open Wounding Event: Bump Status: Date Acquired: 06/03/2021 Comorbid Glaucoma, Sleep Apnea, Hypertension, Peripheral Arterial Disease, Weeks Of Treatment: 32 History: Peripheral Venous Disease, Type II Diabetes, Gout, Osteoarthritis, Clustered Wound: No Neuropathy Photos Wound Measurements Length: (cm) 9.5 Width: (cm) 1.8 Depth: (cm) 0.1 Area: (cm) 13.43 Volume: (cm) 1.343 % Reduction in Area: -714.4% % Reduction in Volume: -1.8% Epithelialization: Small (1-33%) Tunneling: No Undermining: No Wound Description Classification: Full Thickness With Exposed Support Structures Wound Margin: Fibrotic scar, thickened scar Exudate Amount: Medium Exudate Type: Serosanguineous Exudate Color: red, brown Foul Odor After Cleansing: No Slough/Fibrino Yes Wound Bed Granulation Amount: Large (67-100%) Exposed Structure Granulation Quality: Red Fascia Exposed: No Necrotic Amount: Small (1-33%) Fat Layer (Subcutaneous Tissue) Exposed: Yes Necrotic Quality: Adherent Slough Tendon Exposed: No Muscle Exposed: No Joint Exposed: No Bone Exposed: No Treatment Notes Wound #22 (Lower Leg) Wound  Laterality: Left, Lateral Cleanser Soap and Water Discharge Instruction: May shower and wash wound with dial antibacterial soap and water prior to dressing change. Wound Cleanser Discharge Instruction: Cleanse the wound with wound cleanser prior to applying a  clean dressing using gauze sponges, not tissue or cotton balls. Peri-Wound Care Triamcinolone 15 (g) Discharge Instruction: Use triamcinolone 15 (g) as directed Zinc Oxide Ointment 30g tube Discharge Instruction: Apply Zinc Oxide to periwound with each dressing change Sween Lotion (Moisturizing lotion) Discharge Instruction: Apply moisturizing lotion to the leg Topical Primary Dressing KerraCel Ag Gelling Fiber Dressing, 4x5 in (silver alginate) Discharge Instruction: Apply silver alginate to wound bed as instructed Secondary Dressing Zetuvit Plus Silicone Border Dressing 4x4 (in/in) Discharge Instruction: Apply silicone border over primary dressing as directed. Secured With Compression Wrap CoFlex TLC XL 2-layer Compression System 4x7 (in/yd) Discharge Instruction: Apply CoFlex 2-layer compression as directed. (alt for 4 layer) Compression Stockings Add-Ons Electronic Signature(s) Signed: 01/19/2022 2:55:40 PM By: Baruch Gouty RN, BSN Signed: 01/19/2022 4:47:37 PM By: Sabas Sous By: Adline Peals on 01/19/2022 11:33:13 -------------------------------------------------------------------------------- Vitals Details Patient Name: Date of Service: [name]. 01/19/2022 11:15 A M Medical Record Number: [id number] Patient Account Number: [id number] Date of Birth/Sex: Treating RN: 1951-04-28 (71 y.o. [name] Primary Care Oviya Ammar: [name] Other Clinician: Referring Aamir Mclinden: Treating Theodoros Stjames/Extender: [name] in Treatment: 40 Vital Signs Time Taken: 11:22 Temperature (F): 98.6 Height (in): 74 Pulse (bpm): 63 Weight (lbs): 238 Respiratory Rate  (breaths/min): 18 Body Mass Index (BMI): 30.6 Blood Pressure (mmHg): 180/73 Capillary Blood Glucose (mg/dl): 128 Reference Range: 80 - 120 mg / dl Electronic Signature(s) Signed: 01/19/2022 2:55:40 PM By: Baruch Gouty RN, BSN Entered By: Baruch Gouty on 01/19/2022 11:22:59

## 2022-01-25 ENCOUNTER — Encounter (HOSPITAL_BASED_OUTPATIENT_CLINIC_OR_DEPARTMENT_OTHER): Payer: Medicare Other | Attending: Internal Medicine | Admitting: Internal Medicine

## 2022-01-25 DIAGNOSIS — E1122 Type 2 diabetes mellitus with diabetic chronic kidney disease: Secondary | ICD-10-CM | POA: Diagnosis not present

## 2022-01-25 DIAGNOSIS — Z794 Long term (current) use of insulin: Secondary | ICD-10-CM | POA: Insufficient documentation

## 2022-01-25 DIAGNOSIS — E1151 Type 2 diabetes mellitus with diabetic peripheral angiopathy without gangrene: Secondary | ICD-10-CM | POA: Insufficient documentation

## 2022-01-25 DIAGNOSIS — N183 Chronic kidney disease, stage 3 unspecified: Secondary | ICD-10-CM | POA: Insufficient documentation

## 2022-01-25 DIAGNOSIS — G4730 Sleep apnea, unspecified: Secondary | ICD-10-CM | POA: Diagnosis not present

## 2022-01-25 DIAGNOSIS — L97528 Non-pressure chronic ulcer of other part of left foot with other specified severity: Secondary | ICD-10-CM | POA: Diagnosis not present

## 2022-01-25 DIAGNOSIS — Z87891 Personal history of nicotine dependence: Secondary | ICD-10-CM | POA: Insufficient documentation

## 2022-01-25 DIAGNOSIS — E11621 Type 2 diabetes mellitus with foot ulcer: Secondary | ICD-10-CM | POA: Insufficient documentation

## 2022-01-25 DIAGNOSIS — E1140 Type 2 diabetes mellitus with diabetic neuropathy, unspecified: Secondary | ICD-10-CM | POA: Insufficient documentation

## 2022-01-25 DIAGNOSIS — I129 Hypertensive chronic kidney disease with stage 1 through stage 4 chronic kidney disease, or unspecified chronic kidney disease: Secondary | ICD-10-CM | POA: Diagnosis not present

## 2022-01-25 DIAGNOSIS — I87322 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: Secondary | ICD-10-CM | POA: Insufficient documentation

## 2022-01-25 DIAGNOSIS — L97828 Non-pressure chronic ulcer of other part of left lower leg with other specified severity: Secondary | ICD-10-CM | POA: Diagnosis not present

## 2022-01-25 DIAGNOSIS — I890 Lymphedema, not elsewhere classified: Secondary | ICD-10-CM | POA: Insufficient documentation

## 2022-01-25 NOTE — Progress Notes (Signed)
LUCA, DYAR ([id number]) Visit Report for 01/25/2022 SuperBill Details Patient Name: Date of Service: [name], [name] 01/25/2022 Medical Record Number: [id number] Patient Account Number: [id number] Date of Birth/Sex: Treating RN: 1951/01/12 (71 y.o. [name] Primary Care Provider: [name] Other Clinician: Referring Provider: Treating Provider/Extender: Cornelious Bryant in Treatment: 41 Diagnosis Coding ICD-10 Codes Code Description 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 I89.0 Lymphedema, not elsewhere classified I87.322 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verity L84 Corns and callosities Facility Procedures CPT4 Code Description Modifier Quantity [id number] (Facility Use Only) [phone] - APPLY MULTLAY COMPRS LWR LT LEG 1 Electronic Signature(s) Signed: 01/25/2022 3:37:06 PM By: Adline Peals Signed: 01/25/2022 3:55:06 PM By: Kalman Shan DO Entered By: Adline Peals on 01/25/2022 13:59:10

## 2022-01-25 NOTE — Progress Notes (Signed)
BEVAN, DISNEY ([id number]) Visit Report for 01/25/2022 Arrival Information Details Patient Name: Date of Service: [name], [name] 01/25/2022 1:30 PM Medical Record Number: [id number] Patient Account Number: [id number] Date of Birth/Sex: Treating RN: [birth year] (71 y.o. [name] Primary Care Kyngston Pickelsimer: [name] Other Clinician: Referring Soma Lizak: Treating Raia Amico/Extender: Cornelious Bryant in Treatment: 80 Visit Information History Since Last Visit Added or deleted any medications: No Patient Arrived: Ambulatory Any new allergies or adverse reactions: No Arrival Time: 13:57 Had a fall or experienced change in No Accompanied By: self activities of daily living that may affect Transfer Assistance: None risk of falls: Patient Identification Verified: Yes Signs or symptoms of abuse/neglect since last visito No Secondary Verification Process Completed: Yes Hospitalized since last visit: No Patient Requires Transmission-Based Precautions: No Implantable device outside of the clinic excluding No Patient Has Alerts: Yes cellular tissue based products placed in the center since last visit: Has Dressing in Place as Prescribed: Yes Has Compression in Place as Prescribed: Yes Pain Present Now: No Electronic Signature(s) Signed: 01/25/2022 3:37:06 PM By: Adline Peals Entered By: Adline Peals on 01/25/2022 13:57:41 -------------------------------------------------------------------------------- Compression Therapy Details Patient Name: Date of Service: [name]. 01/25/2022 1:30 PM Medical Record Number: [id number] Patient Account Number: [id number] Date of Birth/Sex: Treating RN: August 27, 1950 (71 y.o. [name] Primary Care Lorianna Spadaccini: [name] Other Clinician: Referring Calla Wedekind: Treating Kathren Scearce/Extender: Cornelious Bryant in Treatment: 70 Compression Therapy Performed for Wound Assessment: Wound #22  Left,Lateral Lower Leg Performed By: Clinician Adline Peals, RN Compression Type: Double Layer Electronic Signature(s) Signed: 01/25/2022 3:37:06 PM By: Sabas Sous By: Adline Peals on 01/25/2022 13:58:15 -------------------------------------------------------------------------------- Encounter Discharge Information Details Patient Name: Date of Service: [name]. 01/25/2022 1:30 PM Medical Record Number: [id number] Patient Account Number: [id number] Date of Birth/Sex: Treating RN: 12/21/1950 (71 y.o. [name] Primary Care Bengie Kaucher: [name] Other Clinician: Referring Jamesen Stahnke: Treating Tomekia Helton/Extender: Cornelious Bryant in Treatment: 3 Encounter Discharge Information Items Discharge Condition: Stable Ambulatory Status: Ambulatory Discharge Destination: Home Transportation: Private Auto Accompanied By: self Schedule Follow-up Appointment: Yes Clinical Summary of Care: Patient Declined Electronic Signature(s) Signed: 01/25/2022 3:37:06 PM By: Sabas Sous By: Adline Peals on 01/25/2022 13:58:55 -------------------------------------------------------------------------------- Patient/Caregiver Education Details Patient Name: Date of Service: [name] 8/3/2023andnbsp1:30 PM Medical Record Number: [id number] Patient Account Number: [id number] Date of Birth/Gender: Treating RN: 30-Aug-1950 (71 y.o. [name] Primary Care Physician: [name] Other Clinician: Referring Physician: Treating Physician/Extender: Cornelious Bryant in Treatment: 7 Education Assessment Education Provided To: Patient Education Topics Provided Wound/Skin Impairment: Methods: Explain/Verbal Responses: Reinforcements needed, State content correctly Electronic Signature(s) Signed: 01/25/2022 3:37:06 PM By: Adline Peals Entered By: Adline Peals on 01/25/2022  13:58:44 -------------------------------------------------------------------------------- Wound Assessment Details Patient Name: Date of Service: [name]. 01/25/2022 1:30 PM Medical Record Number: [id number] Patient Account Number: [id number] Date of Birth/Sex: Treating RN: 11/16/1950 (71 y.o. [name] Primary Care Kyia Rhude: [name] Other Clinician: Referring Kamaria Lucia: Treating Alyviah Crandle/Extender: Cornelious Bryant in Treatment: 41 Wound Status Wound Number: 22 Primary Cyst Etiology: Wound Location: Left, Lateral Lower Leg Wound Open Wounding Event: Bump Wounding Event: Bump Status: Date Acquired: 06/03/2021 Comorbid Glaucoma, Sleep Apnea, Hypertension, Peripheral Arterial Disease, Weeks Of Treatment: 33 History: Peripheral Venous Disease, Type II Diabetes, Gout, Osteoarthritis, Clustered Wound: No Neuropathy Wound Measurements Length: (cm) 9.5 Width: (cm) 1.8 Depth: (cm) 0.1 Area: (cm) 13.43 Volume: (cm) 1.343 % Reduction  in Area: -714.4% % Reduction in Volume: -1.8% Epithelialization: Small (1-33%) Wound Description Classification: Full Thickness With Exposed Support Structures Wound Margin: Fibrotic scar, thickened scar Exudate Amount: Medium Exudate Type: Serosanguineous Exudate Color: red, brown Foul Odor After Cleansing: No Slough/Fibrino Yes Wound Bed Granulation Amount: Large (67-100%) Exposed Structure Granulation Quality: Red Fascia Exposed: No Necrotic Amount: Small (1-33%) Fat Layer (Subcutaneous Tissue) Exposed: Yes Necrotic Quality: Adherent Slough Tendon Exposed: No Muscle Exposed: No Joint Exposed: No Bone Exposed: No Treatment Notes Wound #22 (Lower Leg) Wound Laterality: Left, Lateral Cleanser Soap and Water Discharge Instruction: May shower and wash wound with dial antibacterial soap and water prior to dressing change. Wound Cleanser Discharge Instruction: Cleanse the wound with wound cleanser  prior to applying a clean dressing using gauze sponges, not tissue or cotton balls. Peri-Wound Care Triamcinolone 15 (g) Discharge Instruction: Use triamcinolone 15 (g) as directed Zinc Oxide Ointment 30g tube Discharge Instruction: Apply Zinc Oxide to periwound with each dressing change Sween Lotion (Moisturizing lotion) Discharge Instruction: Apply moisturizing lotion to the leg Topical Primary Dressing KerraCel Ag Gelling Fiber Dressing, 4x5 in (silver alginate) Discharge Instruction: Apply silver alginate to wound bed as instructed Secondary Dressing Zetuvit Plus Silicone Border Dressing 4x4 (in/in) Discharge Instruction: Apply silicone border over primary dressing as directed. Secured With Compression Wrap CoFlex TLC XL 2-layer Compression System 4x7 (in/yd) Discharge Instruction: Apply CoFlex 2-layer compression as directed. (alt for 4 layer) Compression Stockings Add-Ons Electronic Signature(s) Signed: 01/25/2022 3:37:06 PM By: Adline Peals Entered By: Adline Peals on 01/25/2022 13:58:01 -------------------------------------------------------------------------------- Vitals Details Patient Name: Date of Service: [name]. 01/25/2022 1:30 PM Medical Record Number: [id number] Patient Account Number: [id number] Date of Birth/Sex: Treating RN: 1950-08-05 (71 y.o. [name] Primary Care Tobenna Needs: [name] Other Clinician: Referring Maimouna Rondeau: Treating Calinda Stockinger/Extender: Cornelious Bryant in Treatment: 29 Vital Signs Time Taken: 14:02 Temperature (F): 98.4 Height (in): 74 Pulse (bpm): 68 Weight (lbs): 238 Respiratory Rate (breaths/min): 18 Body Mass Index (BMI): 30.6 Blood Pressure (mmHg): 164/80 Reference Range: 80 - 120 mg / dl Electronic Signature(s) Signed: 01/25/2022 3:37:06 PM By: Adline Peals Entered By: Adline Peals on 01/25/2022 14:02:43

## 2022-02-01 ENCOUNTER — Encounter (HOSPITAL_BASED_OUTPATIENT_CLINIC_OR_DEPARTMENT_OTHER): Payer: Medicare Other | Admitting: General Surgery

## 2022-02-01 DIAGNOSIS — E1151 Type 2 diabetes mellitus with diabetic peripheral angiopathy without gangrene: Secondary | ICD-10-CM | POA: Diagnosis not present

## 2022-02-01 NOTE — Progress Notes (Signed)
KHYLER, ESCHMANN ([id number]) Visit Report for 02/01/2022 Chief Complaint Document Details Patient Name: Date of Service: [name], [name] 02/01/2022 8:15 A M Medical Record Number: [id number] Patient Account Number: [id number] Date of Birth/Sex: Treating RN: 1950-10-07 (71 y.o. [name] Primary Care Provider: [name] Other Clinician: Referring Provider: Treating Provider/Extender: [name] in Treatment: 42 Information Obtained from: Patient Chief Complaint Left leg and foot ulcers 04/12/2021; patient is here for wounds on his left lower leg and left plantar foot over the first metatarsal head Electronic Signature(s) Signed: 02/01/2022 9:31:33 AM By: Fredirick Maudlin MD FACS Entered By: Fredirick Maudlin on 02/01/2022 09:31:33 -------------------------------------------------------------------------------- Debridement Details Patient Name: Date of Service: [name]. 02/01/2022 8:15 A M Medical Record Number: [id number] Patient Account Number: [id number] Date of Birth/Sex: Treating RN: January 26, 1951 (71 y.o. [name] Primary Care Provider: [name] Other Clinician: Referring Provider: Treating Provider/Extender: [name] in Treatment: 42 Debridement Performed for Assessment: Wound #22 Left,Lateral Lower Leg Performed By: Physician Fredirick Maudlin, MD Debridement Type: Debridement Level of Consciousness (Pre-procedure): Awake and Alert Pre-procedure Verification/Time Out Yes - 08:40 Taken: Start Time: 08:41 Pain Control: Lidocaine 4% T opical Solution T Area Debrided (L x W): otal 16.2 (cm) x 1.2 (cm) = 19.44 (cm) Tissue and other material debrided: Non-Viable, Slough, Biofilm, Slough Level: Non-Viable Tissue Debridement Description: Selective/Open Wound Instrument: Curette Bleeding: Minimum Hemostasis Achieved: Pressure Procedural Pain: 0 Post Procedural Pain: 0 Response to Treatment:  Procedure was tolerated well Level of Consciousness (Post- Awake and Alert procedure): Post Debridement Measurements of Total Wound Length: (cm) 16.2 Width: (cm) 1.2 Depth: (cm) 0.1 Volume: (cm) 1.527 Character of Wound/Ulcer Post Debridement: Improved Post Procedure Diagnosis Same as Pre-procedure Electronic Signature(s) Signed: 02/01/2022 10:04:09 AM By: Fredirick Maudlin MD FACS Signed: 02/01/2022 5:20:00 PM By: Baruch Gouty RN, BSN Entered By: Baruch Gouty on 02/01/2022 08:43:44 -------------------------------------------------------------------------------- Debridement Details Patient Name: Date of Service: [name]. 02/01/2022 8:15 A M Medical Record Number: [id number] Patient Account Number: [id number] Date of Birth/Sex: Treating RN: 1951/01/29 (71 y.o. [name] Primary Care Provider: [name] Other Clinician: Referring Provider: Treating Provider/Extender: [name] in Treatment: 42 Debridement Performed for Assessment: Wound #27 Left,Medial Foot Performed By: Physician Fredirick Maudlin, MD Debridement Type: Debridement Severity of Tissue Pre Debridement: Fat layer exposed Level of Consciousness (Pre-procedure): Awake and Alert Pre-procedure Verification/Time Out Yes - 08:40 Taken: Start Time: 08:41 Pain Control: Lidocaine 4% T opical Solution T Area Debrided (L x W): otal 2 (cm) x 2.5 (cm) = 5 (cm) Tissue and other material debrided: Viable, Non-Viable, Callus, Subcutaneous, Skin: Epidermis Level: Skin/Subcutaneous Tissue Debridement Description: Excisional Instrument: Curette Bleeding: Minimum Hemostasis Achieved: Pressure Procedural Pain: 0 Post Procedural Pain: 0 Response to Treatment: Procedure was tolerated well Level of Consciousness (Post- Awake and Alert procedure): Post Debridement Measurements of Total Wound Length: (cm) 1.3 Width: (cm) 1 Depth: (cm) 0.1 Volume: (cm) 0.102 Character of  Wound/Ulcer Post Debridement: Improved Severity of Tissue Post Debridement: Fat layer exposed Post Procedure Diagnosis Same as Pre-procedure Electronic Signature(s) Signed: 02/01/2022 10:04:09 AM By: Fredirick Maudlin MD FACS Signed: 02/01/2022 5:20:00 PM By: Baruch Gouty RN, BSN Entered By: Baruch Gouty on 02/01/2022 08:50:16 -------------------------------------------------------------------------------- HPI Details Patient Name: Date of Service: [name]. 02/01/2022 8:15 A M Medical Record Number: [id number] Patient Account Number: [id number] Date of Birth/Sex: Treating RN: 1951-05-09 (71 y.o. [name] Primary Care Provider: Other Clinician: [name] Referring Provider:  Treating Provider/Extender: [name] in Treatment: 46 History of Present Illness HPI Description: 10/11/17; [name] is a 71 year old man who tells me that in 2015 he slipped down the latter traumatizing his left leg. He developed a wound in the same spot the area that we are currently looking at. He states this closed over for the most part although he always felt it was somewhat unstable. In 2016 he hit the same area with the door of his car had this reopened. He tells me that this is never really closed although sometimes an inflow it remains open on a constant basis. He has not been using any specific dressing to this except for topical antibiotics the nature of which were not really sure. His primary doctor did send him to see Dr. [name] of interventional cardiology. He underwent an angiogram on 08/06/17 and he underwent a PTA and directional atherectomy of the lesser distal SFA and popliteal arteries which resulted in brisk improvement in blood flow. It was noted that he had 2 vessel runoff through the anterior tibial and peroneal. He is also been to see vascular and interventional radiologist. He was not felt to have any significant superficial venous insufficiency.  Presumably is not a candidate for any ablation. It was suggested he come here for wound care. The patient is a type II diabetic on insulin. He also has a history of venous insufficiency. ABIs on the left were noncompressible in our clinic 10/21/17; patient we admitted to the clinic last week. 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 We put Iodosorb on him after an aggressive debridement and 3 layer compression. He complained of pain in his ankle and itching with is skin in fact he scratched the area on the medial calf superiorly at the rim of our wraps and he has 2 small open areas in that location today which are new. I changed his primary dressing today to silver collagen. As noted he is already had revascularization and does not have any significant superficial venous insufficiency that would be amenable to ablation 10/28/17; patient admitted to the clinic 2 weeks ago. He has a smaller Wound. Scratch injury from last week revealed. There is large wound over the tibial area. This is smaller. Granulation looks healthy. No need for debridement. 11/04/17; the wound on the left lateral calf looks better. Improved dimensions. Surface of this looks better. We've been maintaining him and Kerlix Coban wraps. He finds this much more comfortable. Silver collagen dressing 11/11/17; left lateral Wound continues to look healthy be making progress. Using a #5 curet I removed removed nonviable skin from the surface of the wound and then necrotic debris from the wound surface. Surface of the wound continues to look healthy. He also has an open area on the left great toenail bed. We've been using topical antibiotics. 11/19/17; left anterior lateral wound continues to look healthy but it's not closed. He also had a small wound above this on the left leg Initially traumatic wounds in the setting of significant chronic venous insufficiency and stasis  dermatitis 11/25/17; left anterior wounds superiorly is closed still a small wound inferiorly. 12/02/17; left anterior tibial area. Arrives today with adherent callus. Post debridement clearly not completely closed. Hydrofera Blue under 3 layer compression. 12/09/17; left anterior tibia. Circumferential eschar however the wound bed looks stable to improved. We've been using Hydrofera Blue under 3 layer compression 12/17/17; left anterior tibia. Apparently  this was felt to be closed however when the wrap was taken off there is a skin tear to reopen wounds in the same area we've been using Hydrofera Blue under 3 layer compression 12/23/17 left anterior tibia. Not close to close this week apparently the [facility] was stuck to this again. Still circumferential eschar requiring debridement. I put a contact layer on this this time under the Hydrofera Blue 12/31/17; left anterior tibia. Wound is better slight amount of hyper-granulation. Using Hydrofera Blue over Adaptic. 01/07/18; left anterior tibia. The wound had some surface eschar however after this was removed he has no open wound.he was already revascularized by Dr. [name] when he came to our clinic with atherectomy of the left SFA and popliteal artery. He was also sent to interventional radiology for venous reflux studies. He was not felt to have significant reflux but certainly has chronic venous changes of his skin with hemosiderin deposition around this area. He will definitely need to lubricate his skin and wear compression stocking and I've talked to him about this. READMISSION 05/26/2018 This is a now 71 year old man we cared for with traumatic wounds on his left anterior lower extremity. He had been previously revascularized during that admission by Dr. [name]. Apparently in follow-up Dr. [name] noted that he had deterioration in his arterial status. He underwent a stent placement in the distal left SFA on 04/22/2018. 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 He went back to the angiography suite on 04/30/2018 he underwent PTA and balloon angioplasty of the occluded left mid anterior tibial artery, thrombotic occlusion went from 100 to 0% which reconstitutes the posterior tibial artery. He had thrombectomy and aspiration of the peroneal artery. The stent placed in the distal SFA left SFA was still occluded. 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 He has been recuperating at [place] [facility] in [facility] after the most recent hospitalization. He is going home tomorrow. He tells me that 2 weeks ago he traumatized the tip of his left fifth toe. He came in urgently for our review of this. This was a history of before I noted that Dr. [name] had already noted dry gangrenous changes of the left fifth toe 06/09/2018; 2-week follow-up. I did contact Dr. [name] after his last appointment and he apparently saw 1 of Dr. Irven Shelling colleagues the next day. He does not follow-up with Dr. [name] himself until Thursday of this week. 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 Nevertheless there is no evidence of infection no drainage and no pain. He had a new area that this week when we were signing him in today on the left anterior mid tibia area, this is in close proximity to the previous wound we have dealt with in this clinic. 06/23/2018; 2-week follow-up. I did not receive a recent note from Dr. [name] to review today. Our office is trying to obtain this. He is apparently not planning to do further vascular interventions and wondered about compression to try and help with the patient's chronic venous insufficiency. However we are also concerned about the arterial flow. He  arrives in clinic today with a new area on the left third toe. The areas on the calf/anterior  tibia are close to closing. The left fifth toe is still mummified using Betadine. -In reviewing things with the patient he has what sounds like claudication with mild to moderate amount of activity. 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 I prescribed Levaquin over the phone while we attempted to arrange a plan of care. However the patient called yesterday to report he had low-grade fever and he came in today acutely. There is been a marked deterioration in the left third toe with spreading cellulitis up into the dorsal left foot. He was referred to the emergency room. Readmission: 06/29/2020 patient presents today for reevaluation here in our clinic he was previously treated by Dr. Dellia Nims at the latter part of 2019 in 2 the beginning of 2020. Subsequently we have not seen him since that time in the interim he did have evaluation with vein and vascular specialist specifically Dr. Anice Paganini who did perform quite extensive work for a left femoral to anterior tibial artery bypass. With that being said in the interim the patient has developed significant lymphedema and has wounds that he tells me have really never healed in regard to the incision site on the left leg. He also has multiple wounds on the feet for various reasons some of which is that he tends to pick at his feet. Fortunately there is no signs of active infection systemically at this time he does have some wounds that are little bit deeper but most are fairly superficial he seems to have good blood flow and overall everything appears to be healthy I see no bone exposed and no obvious signs of osteomyelitis. I do not know that he necessarily needs a x-ray at this point although that something we could consider depending on how things progress. The patient does have a history of lymphedema, diabetes, this is type II,  chronic kidney disease stage III, hypertension, and history of peripheral vascular disease. 07/05/2020; patient admitted last week. Is a patient I remember from 2019 he had a spreading infection involving the left foot and we sent him to the hospital. 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 He subsequently had a left femoral to anterior tibial bypass by Dr.Cain vein and vascular. He also has severe lymphedema with chronic skin changes related to that on the left leg. The most problematic area that was new today was on the left medial great toe. This was apparently a small area last week there was purulent drainage which our intake nurse cultured. Also areas on the left medial foot and heel left lateral foot. He has 2 areas on the left medial calf left lateral calf in the setting of the severe lymphedema. 07/13/2020 on evaluation today patient appears to be doing better in my opinion compared to his last visit. The good news is there is no signs of active infection systemically and locally I do not see any signs of infection either. He did have an x-ray which was negative that is great news he had a culture which showed MRSA but at the same time he is been on the doxycycline which has helped. I do think we may want to extend this for 7 additional days 1/25; patient admitted to the clinic a few weeks ago. 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 We have been putting him under compression his edema control is a lot better but he is severe  verricused skin on the left leg. He is really done quite well he still has an open area on the left medial calf and the left medial first metatarsal head. We have been using silver collagen on the leg silver alginate on the foot 07/27/2020 upon evaluation today patient appears to be doing decently well in regard to his wounds. He still has a lot of dry skin on the left leg. Some of this  is starting to peel back and I think he may be able to have them out by removing some that today. Fortunately there is no signs of active infection at this time on the left leg although on the right leg he does appear to have swelling and erythema as well as some mild warmth to touch. This does have been concerned about the possibility of cellulitis although within the differential diagnosis I do think that potentially a DVT has to be at least considered. We need to rule that out before proceeding would just call in the cellulitis. Especially since he is having pain in the posterior aspect of his calf muscle. 2/8; the patient had seen sparingly. He has severe skin changes of chronic lymphedema in the left leg thickened hyperkeratotic verrucous skin. He has an open wound on the medial part of the left first met head left mid tibia. He also has a rim of nonepithelialized skin in the anterior mid tibia. He brought in the AmLactin lotion that was been prescribed although I am not sure under compression and its utility. There concern about cellulitis on the right lower leg the last time he was here. He was put on on antibiotics. His DVT rule out was negative. The right leg looks fine he is using his stocking on this area 08/10/2020 upon evaluation today patient appears to be doing well with regard to his leg currently. He has been tolerating the dressing changes without complication. Fortunately there is no signs of active infection which is great news. Overall very pleased with where things stand. 2/22; the patient still has an area on the medial part of the left first met his head. This looks better than when I last saw this earlier this month he has a rim of epithelialization but still some surface debris. Mostly everything on the left leg is healed. There is still a vulnerable in the left mid tibia area. 08/30/2020 upon evaluation today patient appears to be doing much better in regard to his wounds on his  foot. 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 Nonetheless I think we will address that today I Minna send in a prescription for him in that regard. Overall though there does not appear to be any signs of significant worsening. 09/07/2020 on evaluation today patient's wounds over his left foot appear to be doing excellent. I do not see any signs of infection there is some callus buildup this can require debridement for certain but overall I feel like he is managing quite nicely. He still using the AmLactin cream which has been beneficial for him as well. 3/22; left foot wound is closed. There is no open area here. He is using ammonium lactate lotion to the lower extremities to help exfoliate dry cracked skin. He has compression stockings from elastic therapy in [place]. The wound on the medial part of his left first met head is healed today. READMISSION  04/12/2021 Mr. Sitts is a patient we know fairly well he had a prolonged stay in clinic in 2019 with wounds on his left lateral and left anterior lower extremity in the setting of chronic venous insufficiency. More recently he was here earlier this year with predominantly an area on his left foot first metatarsal head plantar and he says the plantar foot broke down on its not long after we discharged him but he did not come back here. The last few months areas of broken down on his left anterior and again the left lateral lower extremity. The leg itself is very swollen chronically enlarged a lot of hyperkeratotic dry Berry Q skin in the left lower leg. His edema extends well into the thigh. He was seen by Dr. Donzetta Matters. He had ABIs on 03/02/2021 showing an ABI on the right of 1 with a TBI of 0.72 his ABI in the left at 1.09 TBI of 0.99. Monophasic and biphasic waveforms on the right. On the left monophasic waveforms were  noted he went on to have an angiogram on 03/27/2021 this showed the aortic aortic and iliac segments were free of flow-limiting stenosis the left common femoral vein to evaluate the left femoral to anterior tibial artery bypass was unobstructed the bypass was patent without any areas of stenosis. We discharged the patient in bilateral juxta lite stockings but very clearly that was not sufficient to control the swelling and maintain skin integrity. He is clearly going to need compression pumps. 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 This is fortunate because he is on his feet for long periods of time. 10/27; patient comes in with our intake nurse reporting copious amount of green drainage from the left anterior mid tibia the left dorsal foot and to a lesser extent the left medial mid tibia. We left the compression wrap on all week for the amount of edema in his left leg is quite a bit better. We use silver alginate as the primary dressing 11/3; edema control is good. Left anterior lower leg left medial lower leg and the plantar first metatarsal head. The left anterior lower leg required debridement. Deep tissue culture I did of this wound showed MRSA I put him on 10 days of doxycycline which she will start today. We have him in compression wraps. He has a security card and [employer] however he is retiring on November 15. We will need to then get him into a better offloading boot for the left foot perhaps a total contact cast 11/10; edema control is quite good. Left anterior and left medial lower leg wounds in the setting of chronic venous insufficiency and lymphedema. He also has a substantial area over the left plantar first metatarsal head. 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 He has significant hypergranulation on the left plantar foot wound. The  patient is a diabetic but he does not have significant PAD 11/17; edema control is quite good. Left anterior and left medial lower leg wounds look better. The really concerning area remains the area on the left plantar first metatarsal head. He has a rim of epithelialization. 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 Starting today with a forefoot off loader  but . possibly a total contact cast. 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 Arrives with a very significant odor from I think all of the wounds on the left leg and the left foot. Because of this I did not put a total contact cast on him today but will could still consider this. His wife was having cataract surgery which is the reason he missed the appointment 12/6. I saw this man 5 days ago with a swelling below the popliteal fossa. I thought he actually might have a Baker's cyst however the DVT rule out study that we could arrange right away was negative the technician told me this was not a ruptured Baker's cyst. We attempted to get this aspirated by under ultrasound guidance in interventional radiology however all they did was an ultrasound however it shows an extensive fluid collection 62 x 8 x 9.4 in the left thigh and left calf. The patient states he thinks this started 8 days ago or so but he really is not complaining of any pain, fever or systemic symptoms. He has not ha 12/20; after some difficulty I managed to get the patient into see Dr. Donzetta Matters. Eventually he was taken into the hospital and had a drain put in the fluid collection below his left knee posteriorly extending into the posterior thigh. He still has the drain in place. Culture of this showed moderate staff aureus few Morganella and few Klebsiella he is  now on doxycycline and ciprofloxacin as suggested by infectious disease he is on this for a month. The drain will remain in place until it stops draining 12/29; he comes in today with the 1 wound on his left leg and the area on the left plantar first met head significantly smaller. Both look healthy. He still has the drain in the left leg. He says he has to change this daily. Follows up with Dr. Donzetta Matters on January 11. 06/29/2021; the wounds that I am following on the left leg and left first met head continued to be quite healthy. However the area where his inferior drain is in place had copious amounts of drainage which was green in color. The wound here is larger. Follows up with Dr. Gwenlyn Saran of vein and vascular his surgeon next week as well as infectious disease. He remains on ciprofloxacin and doxycycline. He is not complaining of excessive pain in either one of the drain areas 1/12; the patient saw vascular surgery and infectious disease. Vascular surgery has left the drain in place as there was still some notable drainage still see him back in 2 weeks. Dr. Velna Ochs stop the doxycycline and ciprofloxacin and I do not believe he follows up with them at this point. Culture I did last week showed both doxycycline resistant MRSA and Pseudomonas not sensitive to ciprofloxacin although only in rare titers 1/19; the patient's wound on the left anterior lower leg is just about healed. We have continued healing of the area that was medially on the left leg. Left first plantar metatarsal head continues to get smaller. The major problem here is his 2 drain sites 1 on the left upper calf and lateral thigh. There is purulent drainage still from the left lateral thigh. I gave him antibiotics last week but we still have recultured. He has the drain in the area I think this is eventually going to have  to come out. I suspect there will be a connecting wound to heal here perhaps with improved VAc 1/26; the patient had his  drain removed by vein and vascular on 1/25/. This was a large pocket of fluid in his left thigh that seem to tunnel into his left upper calf. 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 His mention his Penrose drain was removed today. He now has a tunneling wound on his left calf and left thigh. Both of these probe widely towards each other although I cannot really prove that they connect. Both wounds on his lower leg anteriorly are closed and his area over the first metatarsal head on his right foot continues to improve. We are using Hydrofera Blue here. He also saw infectious disease culture of the abscess they noted was polymicrobial with MRSA, Morganella and Klebsiella he was treated with doxycycline and ciprofloxacin for 4 weeks ending on 07/03/2021. They did not recommend any further antibiotics. Notable that while he still had the Penrose drain in place last week he had purulent drainage coming out of the inferior IandD site this grew [facility], MRSA and Pseudomonas but there does not appear to be any active infection in this area today with the drain out and he is not systemically unwell 2/2; with regards to the drain sites the superior one on the thigh actually is closed down the one on the upper left lateral calf measures about 8 and half centimeters which is an improvement seems to be less prominent although still with a lot of drainage. The only remaining wound is over the first metatarsal head on the left foot and this looks to be continuing to improve with Hydrofera Blue. 2/9; the area on his plantar left foot continues to contract. Callus around the wound edge. The drain sites specifically have not come down in depth. We put the wound VAC on Monday he changed the canister late last night our intake nurse reported a pocket of fluid perhaps caused by our compression wraps 2/16; continued improvement in left foot plantar wound. drainage site in the calf is not improved  in terms of depth (wound vac) 2/23; continued improvement in the left foot wound over the first metatarsal head. With regards to the drain sites the area on his thigh laterally is healed however the open area on his calf is small in terms of circumference by still probes in by about 15 cm. Within using the wound VAC. Hydrofera Blue on his foot 08/24/2021: The left first metatarsal head wound continues to improve. The wound bed is healthy with just some surrounding callus. Unfortunately the open drain site on his calf remains open and tunnels at least 15 cm (the extent of a Q-tip). This is despite several weeks of wound VAC treatment. Based on reading back through the notes, there has been really no significant change in the depth of the wound, although the orifice is smaller and the more cranial wound on his thigh has closed. I suspect the tunnel tracks nearly all the way to this location. 08/31/2021: Continued improvement in the left first metatarsal head wound. There has been absolutely no improvement to the long tunnel from his open drain site on his calf. We have tried to get him into see vascular surgery sooner to consider the possibility of simply filleting the tract open and allowing it to heal from the bottom up, likely with a wound VAC. They have not yet scheduled a sooner appointment than his  current mid April 09/14/2021: He was seen by vascular surgery and they took him to the operating room last week. They opened a portion of the tunnel, but did not extend the entire length of the known open subcutaneous tract. I read Dr. Claretha Cooper operative note and it is not clear from that documentation why only a portion of the tract was opened. The heaped up granulation tissue was curetted and removed from at least some portion of the tract. They did place a wound VAC and applied an Unna boot to the leg. The ulcer on his left first metatarsal head is smaller today. The bed looks good and there is just a small  amount of surrounding callus. 09/21/2021: The ulcer on his left first metatarsal head looks to be stalled. There is some callus surrounding the wound but the wound bed itself does not appear particularly dynamic. The tunnel tract on his lateral left leg seems to be roughly the same length or perhaps slightly smaller but the wound bed appears healthy with good granulation tissue. He opened up a new wound on his medial thigh and the site of a prior surgical incision. He says that he did this unconsciously in his sleep by scratching. 09/28/2021: Unfortunately, the ulcer on his left first metatarsal head has extended underneath the callus toward the dorsum of his foot. The medial thigh wounds are roughly the same. The tunnel on his lateral left leg continues to be problematic; it is longer than we are able to actually probe with a Q-tip. I am still not certain as to why Dr. Donzetta Matters did not open this up entirely when he took the patient to the operating room. We will likely be back in the same situation with just a small superficial opening in a long unhealed tract, as the open portion is granulating in nicely. 10/02/2021: The patient was initially scheduled for a nurse visit, but we are also applying a total contact cast today. The plantar foot wound looks clean without significant accumulated callus. We have been applying Prisma silver collagen to the site. 10/05/2021: The patient is here for his first total contact cast change. We have tried using gauze packing strips in the tunnel on his lateral leg wound, but this does not seem to be working any better than the white VAC foam. The foot ulcer looks about the same with minimal periwound callus. Medial thigh wound is clean with just some overlying eschar. 10/12/2021: The plantar foot wound is stable without any significant accumulation of periwound callus. The surface is viable with good granulation tissue. The medial thigh wounds are much smaller and are  epithelializing. On the other hand, he had purulent drainage coming from the tunnel on his lateral leg. He does go back to see Dr. Donzetta Matters next week and is planning to ask him why the wound tunnel was not completely opened at the time of his most recent operation. 10/19/2021: The plantar foot wound is markedly improved and has epithelial tissue coming through the surface. The medial thigh wounds are nearly closed with just a tiny open area. He did see Dr. Donzetta Matters earlier this week and apparently they did discuss the possibility of opening the sinus tract further and enabling a wound VAC application. Apparently there are some limits as to what Dr. 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 I think if we could get the tract open to the level of the popliteal fossa, this would greatly aid in her ability to get  this chart closed. That being said, however, today when I probed the tract with a Q-tip, I was not able to insert the entirety of the Q-tip as I have on previous occasions. The tunnel is shorter by about 4 cm. The surface is clean with good granulation tissue and no further episodes of purulent drainage. 10/30/2021: Last week, the patient underwent surgery and had the long tract in his leg opened. There was a rind that was debrided, according to the operative report. His medial thigh ulcers are closed. The plantar foot wound is clean with a good surface and some built up surrounding callus. 11/06/2021: The overall dimensions of the large wound on his lateral leg remain about the same, but there is good granulation tissue present and the tunneling is a little bit shorter. He has a new wound on his anterior tibial surface, in the same location where he had a similar lesion in the past. The plantar foot wound is clean with some buildup surrounding callus. Just toward the medial aspect of his foot, however, there is an area of darkening that once debrided, revealed another  opening in the skin surface. 11/13/2021: The anterior tibial surface wound is closed. The plantar foot wound has some surrounding callus buildup. The area of darkening that I debrided last week and revealed an opening in the skin surface has closed again. The tunnel in the large wound on his lateral leg has come in by about 3 cm. There is healthy granulation tissue on the entire wound surface. 11/23/2021: The patient was out of town last week and did wet-to-dry dressings on his large wound. 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 Unfortunately, he picked open the wound on his left medial thigh. He says that it was itching and he just could not stop scratching it until it was open again. The wound on his plantar foot is smaller and has not accumulated a tremendous amount of callus. The lateral leg wound is shallower and the tunnel has also decreased in depth. There is just a little bit of slough accumulation on the surface. 11/30/2021: Another portion of his left medial thigh has been opened up. All of these wounds are fairly superficial with just a little bit of slough and eschar accumulation. The wound on his plantar foot is almost closed with just a bit of eschar and periwound callus accumulation. The lateral leg wound is nearly flush with the surrounding skin and the tunnel is markedly shallower. 12/07/2021: There is just 1 open area on his left medial thigh. It is clean with just a little bit of perimeter eschar. The wound on his plantar foot continues to contract and just has some eschar and periwound callus accumulation. The lateral leg wound is closing at the more distal aspect and the tunnel is smaller. The surface is nearly flush with the surrounding skin and it has a good bed of granulation tissue. 12/14/2021: The thigh and foot wounds are closed. The lateral leg wound has closed over approximately half of its length. The tunnel  continues to contract and the surface is now flush with the surrounding skin. The wound bed has robust granulation tissue. 12/22/2021: The thigh and foot wounds have reopened. The foot wound has a lot of callus accumulation around and over it. The thigh wound is tiny with just a little bit of slough in the wound bed. The lateral leg wound continues to contract. His vascular surgeon  took the wound VAC off earlier in the week and the patient has been doing wet-to-dry dressings. There is a little slough accumulation on the surface. The tunnel is about 3 cm in depth at this point. 12/28/2021: The thigh wound is closed again. The foot wound has some callus that subsequently has peeled back exposing just a small slit of a wound. The lateral leg wound Is down to about half the size that it originally was and the tunnel is down to about half a centimeter in depth. 01/04/2022: The thigh wound remains closed. The foot wound has heavy callus overlying the wound site. Once this was debrided, the wound was found to be closed. The lateral leg wound is smaller again this week and very superficial. No tunnel could be identified. 01/12/2022: The thigh and foot wounds both remain closed. The lateral leg wound is now nearly flush with the skin surface. There is good granulation tissue present with a light layer of slough. 01/19/2022: Due to the way his wrap was placed, the patient did not change the dressing on his thigh at all and so the foam was saturated and his skin is macerated. There is a light layer of slough on the wound surface. The underlying granulation tissue is robust and healthy-appearing. He has heavy callus buildup at the site of his first metatarsal head wound which is still healed. 02/01/2022: He has been in silver alginate. When he removed the dressing from his thigh wound, however, some leg, superficially reopening a portion of the wound that had healed. In addition, underneath the callus at his left first  metatarsal head, there appears to be a blister and the wound appears to be open again. Electronic Signature(s) Signed: 02/01/2022 9:32:57 AM By: Fredirick Maudlin MD FACS Entered By: Fredirick Maudlin on 02/01/2022 09:32:57 -------------------------------------------------------------------------------- Physical Exam Details Patient Name: Date of Service: [name]. 02/01/2022 8:15 A M Medical Record Number: [id number] Patient Account Number: [id number] Date of Birth/Sex: Treating RN: 1951-05-10 (71 y.o. [name] Primary Care Provider: [name] Other Clinician: Referring Provider: Treating Provider/Extender: [name] in Treatment: 42 Constitutional . Bradycardic, asymptomatic.. . . No acute distress.Marland Kitchen Respiratory Normal work of breathing on room air.. Notes 02/01/2022: When he removed the dressing from his thigh wound, some dry skin lifted, superficially reopening a portion of the wound that had healed. In addition, underneath the callus at his left first metatarsal head, there appears to be a blister and the wound appears to be open again. Electronic Signature(s) Signed: 02/01/2022 9:35:07 AM By: Fredirick Maudlin MD FACS Entered By: Fredirick Maudlin on 02/01/2022 09:35:07 -------------------------------------------------------------------------------- Physician Orders Details Patient Name: Date of Service: [name]. 02/01/2022 8:15 A M Medical Record Number: [id number] Patient Account Number: [id number] Date of Birth/Sex: Treating RN: August 05, 1950 (71 y.o. [name] Primary Care Provider: [name] Other Clinician: Referring Provider: Treating Provider/Extender: [name] in Treatment: 48 Verbal / Phone Orders: No Diagnosis Coding ICD-10 Coding Code Description 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 I89.0 Lymphedema, not elsewhere  classified I87.322 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verity 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 of other part of left foot with other specified severity Follow-up Appointments ppointment in 1 week. - Dr Celine Ahr - Room 1 Return A Thurs 8/17 @ 08:15 am Anesthetic Wound #22 Left,Lateral Lower Leg Topical Lidocaine 4%  added to wound bed Wound #27 Left,Medial Foot Topical Lidocaine 4% added to wound bed Bathing/ Shower/ Hygiene May shower with protection but do not get wound dressing(s) wet. - Use a cast protector so you can shower without getting your wrap(s) wet Edema Control - Lymphedema / SCD / Other Elevate legs to the level of the heart or above for 30 minutes daily and/or when sitting, a frequency of: - throughout the day Avoid standing for long periods of time. Patient to wear own compression stockings every day. - on right leg; Moisturize legs daily. - Ammonium lactate to right leg daily Wound Treatment Wound #22 - Lower Leg Wound Laterality: Left, Lateral Cleanser: Soap and Water 3 x Per Week/30 Days Discharge Instructions: May shower and wash wound with dial antibacterial soap and water prior to dressing change. Cleanser: Wound Cleanser 3 x Per Week/30 Days Discharge Instructions: Cleanse the wound with wound cleanser prior to applying a clean dressing using gauze sponges, not tissue or cotton balls. Peri-Wound Care: Triamcinolone 15 (g) 3 x Per Week/30 Days Discharge Instructions: Use triamcinolone 15 (g) as directed Peri-Wound Care: Sween Lotion (Moisturizing lotion) 3 x Per Week/30 Days Discharge Instructions: Apply moisturizing lotion to the leg Prim Dressing: KerraCel Ag Gelling Fiber Dressing, 4x5 in (silver alginate) 3 x Per Week/30 Days ary Discharge Instructions: Apply silver alginate to wound bed as instructed Secondary Dressing: ABD  Pad, 8x10 (Generic) 3 x Per Week/30 Days Discharge Instructions: Apply over primary dressing as directed. Secured With: Elastic Bandage 4 inch (ACE bandage) 3 x Per Week/30 Days Discharge Instructions: Secure with ACE bandage as directed. Compression Wrap: CoFlex TLC XL 2-layer Compression System 4x7 (in/yd) 3 x Per Week/30 Days Discharge Instructions: Apply CoFlex 2-layer compression as directed. (alt for 4 layer) Wound #27 - Foot Wound Laterality: Left, Medial Peri-Wound Care: Sween Lotion (Moisturizing lotion) 1 x Per Week Discharge Instructions: Apply moisturizing lotion as directed Prim Dressing: KerraCel Ag Gelling Fiber Dressing, 2x2 in (silver alginate) 1 x Per Week ary Discharge Instructions: Apply silver alginate to wound bed as instructed Secondary Dressing: Optifoam Non-Adhesive Dressing, 4x4 in 1 x Per Week Discharge Instructions: Apply over primary dressing cut to make foam donut Secondary Dressing: Woven Gauze Sponges 2x2 in 1 x Per Week Discharge Instructions: Apply over primary dressing as directed. Secured With: 33M Medipore H Soft Cloth Surgical T ape, 4 x 10 (in/yd) 1 x Per Week Discharge Instructions: Secure with tape as directed. Compression Wrap: CoFlex TLC XL 2-layer Compression System 4x7 (in/yd) 1 x Per Week Discharge Instructions: Apply CoFlex 2-layer compression as directed. (alt for 4 layer) Electronic Signature(s) Signed: 02/01/2022 10:04:09 AM By: Fredirick Maudlin MD FACS Entered By: Fredirick Maudlin on 02/01/2022 09:35:22 -------------------------------------------------------------------------------- Problem List Details Patient Name: Date of Service: [name]. 02/01/2022 8:15 A M Medical Record Number: [id number] Patient Account Number: [id number] Date of Birth/Sex: Treating RN: February 28, 1951 (71 y.o. [name] Primary Care Provider: [name] Other Clinician: Referring Provider: Treating Provider/Extender: [name] in Treatment: 42 Active Problems ICD-10 Encounter Code Description Active Date MDM Diagnosis 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 04/12/2021 No Yes I89.0 Lymphedema, not elsewhere classified 04/12/2021 No Yes I87.322 Chronic venous hypertension (idiopathic) with inflammation of left lower 04/12/2021 No Yes extremity 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 ulcer of other part of left lower leg with other specified 04/12/2021 No Yes severity 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 of other part of left foot with other specified 04/12/2021 No Yes  severity Inactive Problems ICD-10 Code Description Active Date Inactive Date E11.621 Type 2 diabetes mellitus with foot ulcer 04/12/2021 04/12/2021 E11.42 Type 2 diabetes mellitus with diabetic polyneuropathy 04/12/2021 04/12/2021 L02.416 Cutaneous abscess of left lower limb 06/13/2021 06/13/2021 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 02/01/2022 8:15 A M Medical Record Number: [id number] Patient Account Number: [id number] Date of Birth/Sex: Treating RN: 1950-12-09 (71 y.o. [name] Primary Care Provider: [name] Other Clinician: Referring Provider: Treating Provider/Extender: [name] in Treatment: 42 Subjective Chief Complaint Information obtained from Patient Left leg and foot ulcers 04/12/2021; patient is here for wounds on his left lower leg and left plantar foot over the first metatarsal head History of Present Illness (HPI) 10/11/17; Mr. Arns is a 71 year old man who tells me that in 2015 he  slipped down the latter traumatizing his left leg. He developed a wound in the same spot the area that we are currently looking at. He states this closed over for the most part although he always felt it was somewhat unstable. In 2016 he hit the same area with the door of his car had this reopened. He tells me that this is never really closed although sometimes an inflow it remains open on a constant basis. He has not been using any specific dressing to this except for topical antibiotics the nature of which were not really sure. His primary doctor did send him to see Dr. [name] of interventional cardiology. He underwent an angiogram on 08/06/17 and he underwent a PTA and directional atherectomy of the lesser distal SFA and popliteal arteries which resulted in brisk improvement in blood flow. It was noted that he had 2 vessel runoff through the anterior tibial and peroneal. He is also been to see vascular and interventional radiologist. He was not felt to have any significant superficial venous insufficiency. Presumably is not a candidate for any ablation. It was suggested he come here for wound care. The patient is a type II diabetic on insulin. He also has a history of venous insufficiency. ABIs on the left were noncompressible in our clinic 10/21/17; patient we admitted to the clinic last week. 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 We put Iodosorb on him after an aggressive debridement and 3 layer compression. He complained of pain in his ankle and itching with is skin in fact he scratched the area on the medial calf superiorly at the rim of our wraps and he has 2 small open areas in that location today which are new. I changed his primary dressing today to silver collagen. As noted he is already had revascularization and does not have any significant superficial venous insufficiency that would be amenable to ablation 10/28/17;  patient admitted to the clinic 2 weeks ago. He has a smaller Wound. Scratch injury from last week revealed. There is large wound over the tibial area. This is smaller. Granulation looks healthy. No need for debridement. 11/04/17; the wound on the left lateral calf looks better. Improved dimensions. Surface of this looks better. We've been maintaining him and Kerlix Coban wraps. He finds this much more comfortable. Silver collagen dressing 11/11/17; left lateral Wound continues to look healthy be making progress. Using  a #5 curet I removed removed nonviable skin from the surface of the wound and then necrotic debris from the wound surface. Surface of the wound continues to look healthy. ooHe also has an open area on the left great toenail bed. We've been using topical antibiotics. 11/19/17; left anterior lateral wound continues to look healthy but it's not closed. ooHe also had a small wound above this on the left leg ooInitially traumatic wounds in the setting of significant chronic venous insufficiency and stasis dermatitis 11/25/17; left anterior wounds superiorly is closed still a small wound inferiorly. 12/02/17; left anterior tibial area. Arrives today with adherent callus. Post debridement clearly not completely closed. Hydrofera Blue under 3 layer compression. 12/09/17; left anterior tibia. Circumferential eschar however the wound bed looks stable to improved. We've been using Hydrofera Blue under 3 layer compression 12/17/17; left anterior tibia. Apparently this was felt to be closed however when the wrap was taken off there is a skin tear to reopen wounds in the same area we've been using Hydrofera Blue under 3 layer compression 12/23/17 left anterior tibia. Not close to close this week apparently the [facility] was stuck to this again. Still circumferential eschar requiring debridement. I put a contact layer on this this time under the Hydrofera Blue 12/31/17; left anterior tibia. Wound is  better slight amount of hyper-granulation. Using Hydrofera Blue over Adaptic. 01/07/18; left anterior tibia. The wound had some surface eschar however after this was removed he has no open wound.he was already revascularized by Dr. [name] when he came to our clinic with atherectomy of the left SFA and popliteal artery. He was also sent to interventional radiology for venous reflux studies. He was not felt to have significant reflux but certainly has chronic venous changes of his skin with hemosiderin deposition around this area. He will definitely need to lubricate his skin and wear compression stocking and I've talked to him about this. READMISSION 05/26/2018 This is a now 71 year old man we cared for with traumatic wounds on his left anterior lower extremity. He had been previously revascularized during that admission by Dr. [name]. Apparently in follow-up Dr. [name] noted that he had deterioration in his arterial status. He underwent a stent placement in the distal left SFA on 04/22/2018. 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 He went back to the angiography suite on 04/30/2018 he underwent PTA and balloon angioplasty of the occluded left mid anterior tibial artery, thrombotic occlusion went from 100 to 0% which reconstitutes the posterior tibial artery. He had thrombectomy and aspiration of the peroneal artery. The stent placed in the distal SFA left SFA was still occluded. 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 He has been recuperating at [facility] in [place] [facility] after the most recent hospitalization. He is going home tomorrow. He tells me that 2 weeks ago he traumatized the tip of his left fifth toe. He came in urgently  for our review of this. This was a history of before I noted that Dr. [name] had already noted dry gangrenous changes of the left fifth toe 06/09/2018; 2-week follow-up. I did contact Dr. [name] after his last appointment and he apparently saw 1 of Dr. Irven Shelling colleagues the next day. He does not follow-up  with Dr. [name] himself until Thursday of this week. 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 Nevertheless there is no evidence of infection no drainage and no pain. He had a new area that this week when we were signing him in today on the left anterior mid tibia area, this is in close proximity to the previous wound we have dealt with in this clinic. 06/23/2018; 2-week follow-up. I did not receive a recent note from Dr. [name] to review today. Our office is trying to obtain this. He is apparently not planning to do further vascular interventions and wondered about compression to try and help with the patient's chronic venous insufficiency. However we are also concerned about the arterial flow. ooHe arrives in clinic today with a new area on the left third toe. The areas on the calf/anterior tibia are close to closing. The left fifth toe is still mummified using Betadine. -In reviewing things with the patient he has what sounds like claudication with mild to moderate amount of activity. 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 I prescribed Levaquin over the phone while we attempted to arrange a plan of care. However the patient called yesterday to report he had low-grade fever and he came in today acutely. There is been a marked deterioration in the left third toe with spreading cellulitis up into the dorsal left foot. He was referred to the emergency room. Readmission: 06/29/2020 patient presents today for reevaluation here in our clinic he was previously treated by Dr. Dellia Nims at the latter part of 2019 in 2 the beginning of 2020. Subsequently we  have not seen him since that time in the interim he did have evaluation with vein and vascular specialist specifically Dr. Anice Paganini who did perform quite extensive work for a left femoral to anterior tibial artery bypass. With that being said in the interim the patient has developed significant lymphedema and has wounds that he tells me have really never healed in regard to the incision site on the left leg. He also has multiple wounds on the feet for various reasons some of which is that he tends to pick at his feet. Fortunately there is no signs of active infection systemically at this time he does have some wounds that are little bit deeper but most are fairly superficial he seems to have good blood flow and overall everything appears to be healthy I see no bone exposed and no obvious signs of osteomyelitis. I do not know that he necessarily needs a x-ray at this point although that something we could consider depending on how things progress. The patient does have a history of lymphedema, diabetes, this is type II, chronic kidney disease stage III, hypertension, and history of peripheral vascular disease. 07/05/2020; patient admitted last week. Is a patient I remember from 2019 he had a spreading infection involving the left foot and we sent him to the hospital. 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 He subsequently had a left femoral to anterior tibial bypass by Dr.[facility] vein and vascular. He also has severe lymphedema with chronic skin changes related to that on the left leg. The most problematic area that was new today was on the left medial great toe. This was apparently a small area last week there was purulent drainage which our intake nurse cultured. Also areas on the left medial foot and heel left lateral foot.  He has 2 areas on the left medial calf left lateral calf in the setting of the severe lymphedema. 07/13/2020 on evaluation today patient  appears to be doing better in my opinion compared to his last visit. The good news is there is no signs of active infection systemically and locally I do not see any signs of infection either. He did have an x-ray which was negative that is great news he had a culture which showed MRSA but at the same time he is been on the doxycycline which has helped. I do think we may want to extend this for 7 additional days 1/25; patient admitted to the clinic a few weeks ago. 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 We have been putting him under compression his edema control is a lot better but he is severe verricused skin on the left leg. He is really done quite well he still has an open area on the left medial calf and the left medial first metatarsal head. We have been using silver collagen on the leg silver alginate on the foot 07/27/2020 upon evaluation today patient appears to be doing decently well in regard to his wounds. He still has a lot of dry skin on the left leg. Some of this is starting to peel back and I think he may be able to have them out by removing some that today. Fortunately there is no signs of active infection at this time on the left leg although on the right leg he does appear to have swelling and erythema as well as some mild warmth to touch. This does have been concerned about the possibility of cellulitis although within the differential diagnosis I do think that potentially a DVT has to be at least considered. We need to rule that out before proceeding would just call in the cellulitis. Especially since he is having pain in the posterior aspect of his calf muscle. 2/8; the patient had seen sparingly. He has severe skin changes of chronic lymphedema in the left leg thickened hyperkeratotic verrucous skin. He has an open wound on the medial part of the left first met head left mid tibia. He also has a rim of nonepithelialized skin in the  anterior mid tibia. He brought in the AmLactin lotion that was been prescribed although I am not sure under compression and its utility. There concern about cellulitis on the right lower leg the last time he was here. He was put on on antibiotics. His DVT rule out was negative. The right leg looks fine he is using his stocking on this area 08/10/2020 upon evaluation today patient appears to be doing well with regard to his leg currently. He has been tolerating the dressing changes without complication. Fortunately there is no signs of active infection which is great news. Overall very pleased with where things stand. 2/22; the patient still has an area on the medial part of the left first met his head. This looks better than when I last saw this earlier this month he has a rim of epithelialization but still some surface debris. Mostly everything on the left leg is healed. There is still a vulnerable in the left mid tibia area. 08/30/2020 upon evaluation today patient appears to be doing much better in regard to his wounds on his foot. Fortunately there does not appear to be any signs of active infection systemically though locally we did culture this last week and it does appear that  he does have MRSA currently. Nonetheless I think we will address that today I Minna send in a prescription for him in that regard. Overall though there does not appear to be any signs of significant worsening. 09/07/2020 on evaluation today patient's wounds over his left foot appear to be doing excellent. I do not see any signs of infection there is some callus buildup this can require debridement for certain but overall I feel like he is managing quite nicely. He still using the AmLactin cream which has been beneficial for him as well. 3/22; left foot wound is closed. There is no open area here. He is using ammonium lactate lotion to the lower extremities to help exfoliate dry cracked skin. He has compression stockings from  elastic therapy in [place]. The wound on the medial part of his left first met head is healed today. READMISSION 04/12/2021 Mr. Frater is a patient we know fairly well he had a prolonged stay in clinic in 2019 with wounds on his left lateral and left anterior lower extremity in the setting of chronic venous insufficiency. More recently he was here earlier this year with predominantly an area on his left foot first metatarsal head plantar and he says the plantar foot broke down on its not long after we discharged him but he did not come back here. The last few months areas of broken down on his left anterior and again the left lateral lower extremity. The leg itself is very swollen chronically enlarged a lot of hyperkeratotic dry Berry Q skin in the left lower leg. His edema extends well into the thigh. He was seen by Dr. Donzetta Matters. He had ABIs on 03/02/2021 showing an ABI on the right of 1 with a TBI of 0.72 his ABI in the left at 1.09 TBI of 0.99. Monophasic and biphasic waveforms on the right. On the left monophasic waveforms were noted he went on to have an angiogram on 03/27/2021 this showed the aortic aortic and iliac segments were free of flow-limiting stenosis the left common femoral vein to evaluate the left femoral to anterior tibial artery bypass was unobstructed the bypass was patent without any areas of stenosis. We discharged the patient in bilateral juxta lite stockings but very clearly that was not sufficient to control the swelling and maintain skin integrity. He is clearly going to need compression pumps. 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 This is fortunate because he is on his feet for long periods of time. 10/27; patient comes in with our intake nurse reporting copious amount of green drainage from the left anterior mid tibia the left dorsal foot and to a lesser extent the left medial mid tibia. We left the compression wrap on  all week for the amount of edema in his left leg is quite a bit better. We use silver alginate as the primary dressing 11/3; edema control is good. Left anterior lower leg left medial lower leg and the plantar first metatarsal head. The left anterior lower leg required debridement. Deep tissue culture I did of this wound showed MRSA I put him on 10 days of doxycycline which she will start today. We have him in compression wraps. He has a security card and [employer] however he is retiring on November 15. We will need to then get him into a better offloading boot for the left foot perhaps a total contact cast 11/10; edema control is quite  good. Left anterior and left medial lower leg wounds in the setting of chronic venous insufficiency and lymphedema. He also has a substantial area over the left plantar first metatarsal head. 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 He has significant hypergranulation on the left plantar foot wound. The patient is a diabetic but he does not have significant PAD 11/17; edema control is quite good. Left anterior and left medial lower leg wounds look better. The really concerning area remains the area on the left plantar first metatarsal head. He has a rim of epithelialization. 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 Starting today with a forefoot off loader but . possibly a total contact cast. 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 Arrives with a very significant odor from I think all of the wounds on the left leg and the left foot. Because of this I did not put a total contact cast on him today but will could still consider this. His wife was having  cataract surgery which is the reason he missed the appointment 12/6. I saw this man 5 days ago with a swelling below the popliteal fossa. I thought he actually might have a Baker's cyst however the DVT rule out study that we could arrange right away was negative the technician told me this was not a ruptured Baker's cyst. We attempted to get this aspirated by under ultrasound guidance in interventional radiology however all they did was an ultrasound however it shows an extensive fluid collection 62 x 8 x 9.4 in the left thigh and left calf. The patient states he thinks this started 8 days ago or so but he really is not complaining of any pain, fever or systemic symptoms. He has not ha 12/20; after some difficulty I managed to get the patient into see Dr. Donzetta Matters. Eventually he was taken into the hospital and had a drain put in the fluid collection below his left knee posteriorly extending into the posterior thigh. He still has the drain in place. Culture of this showed moderate staff aureus few Morganella and few Klebsiella he is now on doxycycline and ciprofloxacin as suggested by infectious disease he is on this for a month. The drain will remain in place until it stops draining 12/29; he comes in today with the 1 wound on his left leg and the area on the left plantar first met head significantly smaller. Both look healthy. He still has the drain in the left leg. He says he has to change this daily. Follows up with Dr. Donzetta Matters on January 11. 06/29/2021; the wounds that I am following on the left leg and left first met head continued to be quite healthy. However the area where his inferior drain is in place had copious amounts of drainage which was green in color. The wound here is larger. Follows up with Dr. Gwenlyn Saran of vein and vascular his surgeon next week as well as infectious disease. He remains on ciprofloxacin and doxycycline. He is not complaining of excessive pain in either one of the drain  areas 1/12; the patient saw vascular surgery and infectious disease. Vascular surgery has left the  drain in place as there was still some notable drainage still see him back in 2 weeks. Dr. Velna Ochs stop the doxycycline and ciprofloxacin and I do not believe he follows up with them at this point. Culture I did last week showed both doxycycline resistant MRSA and Pseudomonas not sensitive to ciprofloxacin although only in rare titers 1/19; the patient's wound on the left anterior lower leg is just about healed. We have continued healing of the area that was medially on the left leg. Left first plantar metatarsal head continues to get smaller. The major problem here is his 2 drain sites 1 on the left upper calf and lateral thigh. There is purulent drainage still from the left lateral thigh. I gave him antibiotics last week but we still have recultured. He has the drain in the area I think this is eventually going to have to come out. I suspect there will be a connecting wound to heal here perhaps with improved VAc 1/26; the patient had his drain removed by vein and vascular on 1/25/. This was a large pocket of fluid in his left thigh that seem to tunnel into his left upper calf. 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 His mention his Penrose drain was removed today. He now has a tunneling wound on his left calf and left thigh. Both of these probe widely towards each other although I cannot really prove that they connect. Both wounds on his lower leg anteriorly are closed and his area over the first metatarsal head on his right foot continues to improve. We are using Hydrofera Blue here. He also saw infectious disease culture of the abscess they noted was polymicrobial with MRSA, Morganella and Klebsiella he was treated with doxycycline and ciprofloxacin for 4 weeks ending on 07/03/2021. They did not recommend any further antibiotics. Notable that while he still had the Penrose drain in  place last week he had purulent drainage coming out of the inferior IandD site this grew [facility], MRSA and Pseudomonas but there does not appear to be any active infection in this area today with the drain out and he is not systemically unwell 2/2; with regards to the drain sites the superior one on the thigh actually is closed down the one on the upper left lateral calf measures about 8 and half centimeters which is an improvement seems to be less prominent although still with a lot of drainage. The only remaining wound is over the first metatarsal head on the left foot and this looks to be continuing to improve with Hydrofera Blue. 2/9; the area on his plantar left foot continues to contract. Callus around the wound edge. The drain sites specifically have not come down in depth. We put the wound VAC on Monday he changed the canister late last night our intake nurse reported a pocket of fluid perhaps caused by our compression wraps 2/16; continued improvement in left foot plantar wound. drainage site in the calf is not improved in terms of depth (wound vac) 2/23; continued improvement in the left foot wound over the first metatarsal head. With regards to the drain sites the area on his thigh laterally is healed however the open area on his calf is small in terms of circumference by still probes in by about 15 cm. Within using the wound VAC. Hydrofera Blue on his foot 08/24/2021: The left first metatarsal head wound continues to improve. The wound bed is healthy with just some surrounding callus. Unfortunately the  open drain site on his calf remains open and tunnels at least 15 cm (the extent of a Q-tip). This is despite several weeks of wound VAC treatment. Based on reading back through the notes, there has been really no significant change in the depth of the wound, although the orifice is smaller and the more cranial wound on his thigh has closed. I suspect the tunnel tracks nearly all the  way to this location. 08/31/2021: Continued improvement in the left first metatarsal head wound. There has been absolutely no improvement to the long tunnel from his open drain site on his calf. We have tried to get him into see vascular surgery sooner to consider the possibility of simply filleting the tract open and allowing it to heal from the bottom up, likely with a wound VAC. They have not yet scheduled a sooner appointment than his current mid April 09/14/2021: He was seen by vascular surgery and they took him to the operating room last week. They opened a portion of the tunnel, but did not extend the entire length of the known open subcutaneous tract. I read Dr. Claretha Cooper operative note and it is not clear from that documentation why only a portion of the tract was opened. The heaped up granulation tissue was curetted and removed from at least some portion of the tract. They did place a wound VAC and applied an Unna boot to the leg. The ulcer on his left first metatarsal head is smaller today. The bed looks good and there is just a small amount of surrounding callus. 09/21/2021: The ulcer on his left first metatarsal head looks to be stalled. There is some callus surrounding the wound but the wound bed itself does not appear particularly dynamic. The tunnel tract on his lateral left leg seems to be roughly the same length or perhaps slightly smaller but the wound bed appears healthy with good granulation tissue. He opened up a new wound on his medial thigh and the site of a prior surgical incision. He says that he did this unconsciously in his sleep by scratching. 09/28/2021: Unfortunately, the ulcer on his left first metatarsal head has extended underneath the callus toward the dorsum of his foot. The medial thigh wounds are roughly the same. The tunnel on his lateral left leg continues to be problematic; it is longer than we are able to actually probe with a Q-tip. I am still not certain as to why Dr.  Donzetta Matters did not open this up entirely when he took the patient to the operating room. We will likely be back in the same situation with just a small superficial opening in a long unhealed tract, as the open portion is granulating in nicely. 10/02/2021: The patient was initially scheduled for a nurse visit, but we are also applying a total contact cast today. The plantar foot wound looks clean without significant accumulated callus. We have been applying Prisma silver collagen to the site. 10/05/2021: The patient is here for his first total contact cast change. We have tried using gauze packing strips in the tunnel on his lateral leg wound, but this does not seem to be working any better than the white VAC foam. The foot ulcer looks about the same with minimal periwound callus. Medial thigh wound is clean with just some overlying eschar. 10/12/2021: The plantar foot wound is stable without any significant accumulation of periwound callus. The surface is viable with good granulation tissue. The medial thigh wounds are much smaller and are epithelializing. On  the other hand, he had purulent drainage coming from the tunnel on his lateral leg. He does go back to see Dr. Donzetta Matters next week and is planning to ask him why the wound tunnel was not completely opened at the time of his most recent operation. 10/19/2021: The plantar foot wound is markedly improved and has epithelial tissue coming through the surface. The medial thigh wounds are nearly closed with just a tiny open area. He did see Dr. Donzetta Matters earlier this week and apparently they did discuss the possibility of opening the sinus tract further and enabling a wound VAC application. Apparently there are some limits as to what Dr. 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 I think if we could get the tract open to the level of the popliteal fossa, this would greatly aid in her ability to get this chart closed. That being said,  however, today when I probed the tract with a Q-tip, I was not able to insert the entirety of the Q-tip as I have on previous occasions. The tunnel is shorter by about 4 cm. The surface is clean with good granulation tissue and no further episodes of purulent drainage. 10/30/2021: Last week, the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

## 2022-02-01 NOTE — Progress Notes (Signed)
DEV, DHONDT ([id number]) Visit Report for 02/01/2022 Arrival Information Details Patient Name: Date of Service: DASHIELL, FRANCHINO 02/01/2022 8:15 A M Medical Record Number: [id number] Patient Account Number: [id number] Date of Birth/Sex: Treating RN: [birth year] (71 y.o. [name] Primary Care Willi Borow[place]: [name] Other Clinician: Referring Timmothy Baranowski: Treating Mycheal Veldhuizen/Extender: [name] in Treatment: 42 Visit Information History Since Last Visit Added or deleted any medications: No Patient Arrived: Ambulatory Any new allergies or adverse reactions: No Arrival Time: 08:15 Had a fall or experienced change in No Accompanied By: self activities of daily living that may affect Transfer Assistance: None risk of falls: Patient Identification Verified: Yes Signs or symptoms of abuse/neglect since last visito No Secondary Verification Process Completed: Yes Hospitalized since last visit: No Patient Requires Transmission-Based Precautions: No Implantable device outside of the clinic excluding No Patient Has Alerts: Yes cellular tissue based products placed in the center since last visit: Has Dressing in Place as Prescribed: Yes Has Compression in Place as Prescribed: Yes Pain Present Now: Yes Electronic Signature(s) Signed: 02/01/2022 5:20:00 PM By: Baruch Gouty RN, BSN Entered By: Baruch Gouty on 02/01/2022 08:17:13 -------------------------------------------------------------------------------- Compression Therapy Details Patient Name: Date of Service: [name] 02/01/2022 8:15 A M Medical Record Number: [id number] Patient Account Number: [id number] Date of Birth/Sex: Treating RN: 02/14/1951 (71 y.o. [name] Primary Care Eilee Schader: [name] Other Clinician: Referring Sugey Trevathan: Treating Feliz Lincoln/Extender: [name] in Treatment: 42 Compression Therapy Performed for Wound Assessment: Wound #22  Left,Lateral Lower Leg Performed By: Clinician Baruch Gouty, RN Compression Type: Double Layer Post Procedure Diagnosis Same as Pre-procedure Electronic Signature(s) Signed: 02/01/2022 5:20:00 PM By: Baruch Gouty RN, BSN Entered By: Baruch Gouty on 02/01/2022 08:50:34 -------------------------------------------------------------------------------- Encounter Discharge Information Details Patient Name: Date of Service: [name] 02/01/2022 8:15 A M Medical Record Number: [id number] Patient Account Number: [id number] Date of Birth/Sex: Treating RN: 05/16/1951 (71 y.o. [name] Primary Care Terresa Marlett: [name] Other Clinician: Referring Heloise Gordan: Treating Nikolas Casher/Extender: [name] in Treatment: 20 Encounter Discharge Information Items Post Procedure Vitals Discharge Condition: Stable Temperature (F): 97.9 Ambulatory Status: Ambulatory Pulse (bpm): 52 Discharge Destination: Home Respiratory Rate (breaths/min): 18 Transportation: Private Auto Blood Pressure (mmHg): 128/70 Accompanied By: self Schedule Follow-up Appointment: Yes Clinical Summary of Care: Patient Declined Electronic Signature(s) Signed: 02/01/2022 5:20:00 PM By: Baruch Gouty RN, BSN Entered By: Baruch Gouty on 02/01/2022 09:13:45 -------------------------------------------------------------------------------- Lower Extremity Assessment Details Patient Name: Date of Service: [name] 02/01/2022 8:15 A M Medical Record Number: [id number] Patient Account Number: [id number] Date of Birth/Sex: Treating RN: 11/18/1950 (71 y.o. [name] Primary Care [place] Viereck: [name] Other Clinician: Referring Armani Brar: Treating Senia Even/Extender: [name] in Treatment: 42 Edema Assessment Assessed: [name]: No] [name]: No] Edema: [Left: Ye] [Right: s] Calf Left: Right: Point of Measurement: 41 cm From Medial Instep 46  cm Ankle Left: Right: Point of Measurement: 10 cm From Medial Instep 27.5 cm Vascular Assessment Pulses: Dorsalis Pedis Palpable: [Left:Yes] Electronic Signature(s) Signed: 02/01/2022 5:20:00 PM By: Baruch Gouty RN, BSN Entered By: Baruch Gouty on 02/01/2022 08:26:00 -------------------------------------------------------------------------------- Multi Wound Chart Details Patient Name: Date of Service: [name] 02/01/2022 8:15 A M Medical Record Number: [id number] Patient Account Number: [id number] Date of Birth/Sex: Treating RN: 11-06-1950 (71 y.o. [name] Primary Care Mazal Ebey: [name] Other Clinician: Referring Paiden Cavell: Treating Aris Moman/Extender: [name] in Treatment: 42 Vital Signs Height(in): 74 Capillary  Blood Glucose(mg/dl): 146 Weight(lbs): 238 Pulse(bpm): 43 Body Mass Index(BMI): 30.6 Blood Pressure(mmHg): 128/70 Temperature(F): 97.9 Respiratory Rate(breaths/min): 18 Photos: [27:No Photos] [N/A:N/A] Left, Lateral Lower Leg Left, Medial Foot N/A Wound Location: Bump Blister N/A Wounding Event: Cyst Diabetic Wound/Ulcer of the Lower N/A Primary Etiology: Extremity Glaucoma, Sleep Apnea, Glaucoma, Sleep Apnea, N/A Comorbid History: Hypertension, Peripheral Arterial Hypertension, Peripheral Arterial Disease, Peripheral Venous Disease, Disease, Peripheral Venous Disease, Type II Diabetes, Gout, Osteoarthritis, Type II Diabetes, Gout, Osteoarthritis, Neuropathy Neuropathy 06/03/2021 02/01/2022 N/A Date Acquired: 34 0 N/A Weeks of Treatment: Open Open N/A Wound Status: No No N/A Wound Recurrence: 16.2x1.2x0.1 1.3x1x0.1 N/A Measurements L x W x D (cm) 15.268 1.021 N/A A (cm) : rea 1.527 0.102 N/A Volume (cm) : -825.90% N/A N/A % Reduction in A rea: -15.80% N/A N/A % Reduction in Volume: Full Thickness With Exposed Support Grade 1 N/A Classification: Structures Medium Medium N/A Exudate  A mount: Serosanguineous Serosanguineous N/A Exudate Type: red, brown red, brown N/A Exudate Color: Fibrotic scar, thickened scar Distinct, outline attached N/A Wound Margin: Large (67-100%) Large (67-100%) N/A Granulation A mount: Red Red N/A Granulation Quality: Small (1-33%) None Present (0%) N/A Necrotic A mount: Fat Layer (Subcutaneous Tissue): Yes Fat Layer (Subcutaneous Tissue): Yes N/A Exposed Structures: Fascia: No Fascia: No Tendon: No Tendon: No Muscle: No Muscle: No Joint: No Joint: No Bone: No Bone: No Small (1-33%) Small (1-33%) N/A Epithelialization: Debridement - Selective/Open Wound Debridement - Excisional N/A Debridement: Pre-procedure Verification/Time Out 08:40 08:40 N/A Taken: Lidocaine 4% Topical Solution Lidocaine 4% Topical Solution N/A Pain Control: Slough Callus, Subcutaneous N/A Tissue Debrided: Non-Viable Tissue Skin/Subcutaneous Tissue N/A Level: 19.44 5 N/A Debridement A (sq cm): rea Curette Curette N/A Instrument: Minimum Minimum N/A Bleeding: Pressure Pressure N/A Hemostasis A chieved: 0 0 N/A Procedural Pain: 0 0 N/A Post Procedural Pain: Procedure was tolerated well Procedure was tolerated well N/A Debridement Treatment Response: Post Debridement Measurements L x 16.2x1.2x0.1 1.3x1x0.1 N/A W x D (cm) 1.527 0.102 N/A Post Debridement Volume: (cm) Compression Therapy Debridement N/A Procedures Performed: Debridement Treatment Notes Wound #22 (Lower Leg) Wound Laterality: Left, Lateral Cleanser Soap and Water Discharge Instruction: May shower and wash wound with dial antibacterial soap and water prior to dressing change. Wound Cleanser Discharge Instruction: Cleanse the wound with wound cleanser prior to applying a clean dressing using gauze sponges, not tissue or cotton balls. Peri-Wound Care Triamcinolone 15 (g) Discharge Instruction: Use triamcinolone 15 (g) as directed Sween Lotion (Moisturizing  lotion) Discharge Instruction: Apply moisturizing lotion to the leg Topical Primary Dressing KerraCel Ag Gelling Fiber Dressing, 4x5 in (silver alginate) Discharge Instruction: Apply silver alginate to wound bed as instructed Secondary Dressing ABD Pad, 8x10 Discharge Instruction: Apply over primary dressing as directed. Secured With Elastic Bandage 4 inch (ACE bandage) Discharge Instruction: Secure with ACE bandage as directed. Compression Wrap CoFlex TLC XL 2-layer Compression System 4x7 (in/yd) Discharge Instruction: Apply CoFlex 2-layer compression as directed. (alt for 4 layer) Compression Stockings Add-Ons Wound #27 (Foot) Wound Laterality: Left, Medial Cleanser Peri-Wound Care Sween Lotion (Moisturizing lotion) Discharge Instruction: Apply moisturizing lotion as directed Topical Primary Dressing KerraCel Ag Gelling Fiber Dressing, 2x2 in (silver alginate) Discharge Instruction: Apply silver alginate to wound bed as instructed Secondary Dressing Optifoam Non-Adhesive Dressing, 4x4 in Discharge Instruction: Apply over primary dressing cut to make foam donut Woven Gauze Sponges 2x2 in Discharge Instruction: Apply over primary dressing as directed. Secured With 36M Medipore H Soft Cloth Surgical T ape, 4 x 10 (in/yd) Discharge Instruction: Secure  with tape as directed. Compression Wrap CoFlex TLC XL 2-layer Compression System 4x7 (in/yd) Discharge Instruction: Apply CoFlex 2-layer compression as directed. (alt for 4 layer) Compression Stockings Add-Ons Electronic Signature(s) Signed: 02/01/2022 9:31:24 AM By: Fredirick Maudlin MD FACS Signed: 02/01/2022 5:20:00 PM By: Baruch Gouty RN, BSN Entered By: Fredirick Maudlin on 02/01/2022 09:31:23 -------------------------------------------------------------------------------- Multi-Disciplinary Care Plan Details Patient Name: Date of Service: [name] 02/01/2022 8:15 A M Medical Record Number: [id number] Patient  Account Number: [id number] Date of Birth/Sex: Treating RN: January 10, 1951 (71 y.o. [name] Primary Care Carrina Schoenberger: [name] Other Clinician: Referring Yakir Wenke: Treating Anniebell Bedore/Extender: [name] in Treatment: 35 Multidisciplinary Care Plan reviewed with physician Active Inactive Venous Leg Ulcer Nursing Diagnoses: Knowledge deficit related to disease process and management Potential for venous Insuffiency (use before diagnosis confirmed) Goals: Patient will maintain optimal edema control Date Initiated: 07/27/2021 Target Resolution Date: 02/16/2022 Goal Status: Active Interventions: Assess peripheral edema status every visit. Treatment Activities: Therapeutic compression applied : 07/27/2021 Notes: Wound/Skin Impairment Nursing Diagnoses: Impaired tissue integrity Knowledge deficit related to ulceration/compromised skin integrity Goals: Patient will have a decrease in wound volume by X% from date: (specify in notes) Date Initiated: 04/12/2021 Date Inactivated: 01/04/2022 Target Resolution Date: 04/23/2021 Goal Status: Met Patient/caregiver will verbalize understanding of skin care regimen Date Initiated: 01/04/2022 Target Resolution Date: 03/01/2022 Goal Status: Active Ulcer/skin breakdown will have a volume reduction of 30% by week 4 Date Initiated: 04/12/2021 Date Inactivated: 04/27/2021 Target Resolution Date: 04/27/2021 Goal Status: Unmet Unmet Reason: infection Ulcer/skin breakdown will have a volume reduction of 50% by week 8 Date Initiated: 04/27/2021 Date Inactivated: 06/29/2021 Target Resolution Date: 06/24/2021 Goal Status: Met Interventions: Assess patient/caregiver ability to obtain necessary supplies Assess patient/caregiver ability to perform ulcer/skin care regimen upon admission and as needed Assess ulceration(s) every visit Notes: Electronic Signature(s) Signed: 02/01/2022 5:20:00 PM By: Baruch Gouty RN, BSN Entered  By: Baruch Gouty on 02/01/2022 08:36:20 -------------------------------------------------------------------------------- Pain Assessment Details Patient Name: Date of Service: [name] 02/01/2022 8:15 A M Medical Record Number: [id number] Patient Account Number: [id number] Date of Birth/Sex: Treating RN: 1951-04-29 (71 y.o. [name] Primary Care Margerite Impastato: [name] Other Clinician: Referring Jolisa Intriago: Treating Adeliz Tonkinson/Extender: [name] in Treatment: 42 Active Problems Location of Pain Severity and Description of Pain Patient Has Paino Yes Site Locations Pain Location: Pain in Ulcers With Dressing Change: Yes Duration of the Pain. Constant / Intermittento Intermittent Rate the pain. Current Pain Level: 0 Worst Pain Level: 8 Least Pain Level: 0 Character of Pain Describe the Pain: Sharp, Tender Pain Management and Medication Current Pain Management: Medication: Yes Is the Current Pain Management Adequate: Adequate How does your wound impact your activities of daily livingo Sleep: No Bathing: No Appetite: No Relationship With Others: No Bladder Continence: No Emotions: No Bowel Continence: No Work: No Toileting: No Drive: No Dressing: No Hobbies: No Notes reports pain in ulcer with movement Electronic Signature(s) Signed: 02/01/2022 5:20:00 PM By: Baruch Gouty RN, BSN Entered By: Baruch Gouty on 02/01/2022 08:20:33 -------------------------------------------------------------------------------- Patient/Caregiver Education Details Patient Name: Date of Service: [name] 8/10/2023andnbsp8:15 A M Medical Record Number: [id number] Patient Account Number: [id number] Date of Birth/Gender: Treating RN: 12-11-1950 (71 y.o. [name] Primary Care Physician: [name] Other Clinician: Referring Physician: Treating Physician/Extender: [name] in Treatment:  20 Education Assessment Education Provided To: Patient Education Topics Provided Elevated Blood Sugar/ Impact on Healing: Methods: Explain/Verbal Responses: Reinforcements needed, State content  correctly Venous: Methods: Explain/Verbal Responses: Reinforcements needed, State content correctly Wound/Skin Impairment: Methods: Explain/Verbal Responses: Reinforcements needed, State content correctly Electronic Signature(s) Signed: 02/01/2022 5:20:00 PM By: Baruch Gouty RN, BSN Entered By: Baruch Gouty on 02/01/2022 08:37:01 -------------------------------------------------------------------------------- Wound Assessment Details Patient Name: Date of Service: [name] 02/01/2022 8:15 A M Medical Record Number: [id number] Patient Account Number: [id number] Date of Birth/Sex: Treating RN: 06/24/1951 (71 y.o. [name] Primary Care Meghana Tullo: [name] Other Clinician: Referring Yuri Fana: Treating Calla Wedekind/Extender: [name] in Treatment: 42 Wound Status Wound Number: 22 Primary Cyst Etiology: Wound Location: Left, Lateral Lower Leg Wound Open Wounding Event: Bump Status: Date Acquired: 06/03/2021 Comorbid Glaucoma, Sleep Apnea, Hypertension, Peripheral Arterial Disease, Weeks Of Treatment: 34 History: Peripheral Venous Disease, Type II Diabetes, Gout, Osteoarthritis, Clustered Wound: No Neuropathy Photos Wound Measurements Length: (cm) 16.2 Width: (cm) 1.2 Depth: (cm) 0.1 Area: (cm) 15.268 Volume: (cm) 1.527 % Reduction in Area: -825.9% % Reduction in Volume: -15.8% Epithelialization: Small (1-33%) Tunneling: No Undermining: No Wound Description Classification: Full Thickness With Exposed Support Structures Wound Margin: Fibrotic scar, thickened scar Exudate Amount: Medium Exudate Type: Serosanguineous Exudate Color: red, brown Foul Odor After Cleansing: No Slough/Fibrino Yes Wound Bed Granulation Amount:  Large (67-100%) Exposed Structure Granulation Quality: Red Fascia Exposed: No Necrotic Amount: Small (1-33%) Fat Layer (Subcutaneous Tissue) Exposed: Yes Necrotic Quality: Adherent Slough Tendon Exposed: No Muscle Exposed: No Joint Exposed: No Bone Exposed: No Treatment Notes Wound #22 (Lower Leg) Wound Laterality: Left, Lateral Cleanser Soap and Water Discharge Instruction: May shower and wash wound with dial antibacterial soap and water prior to dressing change. Wound Cleanser Discharge Instruction: Cleanse the wound with wound cleanser prior to applying a clean dressing using gauze sponges, not tissue or cotton balls. Peri-Wound Care Triamcinolone 15 (g) Discharge Instruction: Use triamcinolone 15 (g) as directed Sween Lotion (Moisturizing lotion) Discharge Instruction: Apply moisturizing lotion to the leg Topical Primary Dressing KerraCel Ag Gelling Fiber Dressing, 4x5 in (silver alginate) Discharge Instruction: Apply silver alginate to wound bed as instructed Secondary Dressing ABD Pad, 8x10 Discharge Instruction: Apply over primary dressing as directed. Secured With Elastic Bandage 4 inch (ACE bandage) Discharge Instruction: Secure with ACE bandage as directed. Compression Wrap CoFlex TLC XL 2-layer Compression System 4x7 (in/yd) Discharge Instruction: Apply CoFlex 2-layer compression as directed. (alt for 4 layer) Compression Stockings Add-Ons Electronic Signature(s) Signed: 02/01/2022 5:20:00 PM By: Baruch Gouty RN, BSN Entered By: Baruch Gouty on 02/01/2022 08:34:13 -------------------------------------------------------------------------------- Wound Assessment Details Patient Name: Date of Service: [name] 02/01/2022 8:15 A M Medical Record Number: [id number] Patient Account Number: [id number] Date of Birth/Sex: Treating RN: 10-15-1950 (71 y.o. [name] Primary Care Airelle Everding: [name] Other Clinician: Referring Anjel Pardo: Treating  Sundiata Ferrick/Extender: [name] in Treatment: 42 Wound Status Wound Number: 27 Primary Diabetic Wound/Ulcer of the Lower Extremity Etiology: Wound Location: Left, Medial Foot Wound Open Wounding Event: Blister Status: Date Acquired: 02/01/2022 Comorbid Glaucoma, Sleep Apnea, Hypertension, Peripheral Arterial Disease, Weeks Of Treatment: 0 History: Peripheral Venous Disease, Type II Diabetes, Gout, Osteoarthritis, Clustered Wound: No Neuropathy Wound Measurements Length: (cm) 1.3 Width: (cm) 1 Depth: (cm) 0.1 Area: (cm) 1.021 Volume: (cm) 0.102 % Reduction in Area: % Reduction in Volume: Epithelialization: Small (1-33%) Tunneling: No Undermining: No Wound Description Classification: Grade 1 Wound Margin: Distinct, outline attached Exudate Amount: Medium Exudate Type: Serosanguineous Exudate Color: red, brown Foul Odor After Cleansing: No Slough/Fibrino No Wound Bed Granulation Amount: Large (67-100%) Exposed Structure Granulation Quality:  Red Fascia Exposed: No Necrotic Amount: None Present (0%) Fat Layer (Subcutaneous Tissue) Exposed: Yes Tendon Exposed: No Muscle Exposed: No Joint Exposed: No Bone Exposed: No Treatment Notes Wound #27 (Foot) Wound Laterality: Left, Medial Cleanser Peri-Wound Care Sween Lotion (Moisturizing lotion) Discharge Instruction: Apply moisturizing lotion as directed Topical Primary Dressing KerraCel Ag Gelling Fiber Dressing, 2x2 in (silver alginate) Discharge Instruction: Apply silver alginate to wound bed as instructed Secondary Dressing Optifoam Non-Adhesive Dressing, 4x4 in Discharge Instruction: Apply over primary dressing cut to make foam donut Woven Gauze Sponges 2x2 in Discharge Instruction: Apply over primary dressing as directed. Secured With 56M Medipore H Soft Cloth Surgical T ape, 4 x 10 (in/yd) Discharge Instruction: Secure with tape as directed. Compression Wrap CoFlex TLC XL 2-layer  Compression System 4x7 (in/yd) Discharge Instruction: Apply CoFlex 2-layer compression as directed. (alt for 4 layer) Compression Stockings Add-Ons Electronic Signature(s) Signed: 02/01/2022 5:20:00 PM By: Baruch Gouty RN, BSN Entered By: Baruch Gouty on 02/01/2022 08:49:31 -------------------------------------------------------------------------------- Vitals Details Patient Name: Date of Service: [name] 02/01/2022 8:15 A M Medical Record Number: [id number] Patient Account Number: [id number] Date of Birth/Sex: Treating RN: 09/01/1950 (71 y.o. [name] Primary Care Mikhail Hallenbeck: [name] Other Clinician: Referring Jasie Meleski: Treating Zeriah Baysinger/Extender: [name] in Treatment: 42 Vital Signs Time Taken: 08:18 Temperature (F): 97.9 Height (in): 74 Pulse (bpm): 52 Source: Stated Respiratory Rate (breaths/min): 18 Weight (lbs): 238 Blood Pressure (mmHg): 128/70 Source: Stated Capillary Blood Glucose (mg/dl): 146 Body Mass Index (BMI): 30.6 Reference Range: 80 - 120 mg / dl Notes glucose per pt's meter at present Electronic Signature(s) Signed: 02/01/2022 5:20:00 PM By: Baruch Gouty RN, BSN Entered By: Baruch Gouty on 02/01/2022 08:19:31

## 2022-02-08 ENCOUNTER — Encounter (HOSPITAL_BASED_OUTPATIENT_CLINIC_OR_DEPARTMENT_OTHER): Payer: Medicare Other | Admitting: General Surgery

## 2022-02-08 DIAGNOSIS — E1151 Type 2 diabetes mellitus with diabetic peripheral angiopathy without gangrene: Secondary | ICD-10-CM | POA: Diagnosis not present

## 2022-02-08 NOTE — Progress Notes (Signed)
[name], [name] ([id number]) Visit Report for 02/08/2022 Arrival Information Details Patient Name: Date of Service: [name], [name] 02/08/2022 8:15 A M Medical Record Number: [id number] Patient Account Number: [id number] Date of Birth/Sex: Treating RN: 1950-12-11 (71 y.o. [name] Primary Care Zackeriah Kissler: [name] Other Clinician: Referring [name]: Treating [name]/Extender: [name] in Treatment: 18 Visit Information History Since Last Visit Added or deleted any medications: No Patient Arrived: Ambulatory Any new allergies or adverse reactions: No Arrival Time: 08:15 Had a fall or experienced change in No Accompanied By: self activities of daily living that may affect Transfer Assistance: None risk of falls: Patient Identification Verified: Yes Signs or symptoms of abuse/neglect since No Secondary Verification Process Completed: Yes last visito Patient Requires Transmission-Based Precautions: No Hospitalized since last visit: No Patient Has Alerts: Yes Implantable device outside of the clinic No excluding cellular tissue based products placed in the center since last visit: Has Dressing in Place as Prescribed: Yes Has Compression in Place as Prescribed: Yes Has Footwear/Offloading in Place as Yes Prescribed: Left: Surgical Shoe with Pressure Relief Insole Pain Present Now: Yes Electronic Signature(s) Signed: 02/08/2022 4:24:19 PM By: Sharyn Creamer RN, BSN Entered By: Sharyn Creamer on 02/08/2022 15:36:05 -------------------------------------------------------------------------------- Compression Therapy Details Patient Name: Date of Service: [name]. 02/08/2022 8:15 A M Medical Record Number: [id number] Patient Account Number: [id number] Date of Birth/Sex: Treating RN: Jul 21, 1950 (71 y.o. [name] Primary Care [name]: [name] Other Clinician: Referring Kayley Zeiders: Treating Tradarius Reinwald/Extender: [name] in Treatment: 00 Compression Therapy Performed for Wound Assessment: Wound #22 Left,Lateral Lower Leg Performed By: Clinician Sharyn Creamer, RN Compression Type: Four Layer Post Procedure Diagnosis Same as Pre-procedure Electronic Signature(s) Signed: 02/08/2022 4:24:19 PM By: Sharyn Creamer RN, BSN Entered By: Sharyn Creamer on 02/08/2022 15:36:36 -------------------------------------------------------------------------------- Compression Therapy Details Patient Name: Date of Service: [name]. 02/08/2022 8:15 A M Medical Record Number: [id number] Patient Account Number: [id number] Date of Birth/Sex: Treating RN: [birth year] (71 y.o. [name] Primary Care [name]: [name] Other Clinician: Referring Kameron Glazebrook: Treating [name]/Extender: [name] in Treatment: 32 Compression Therapy Performed for Wound Assessment: Wound #27 Left,Medial Foot Performed By: Clinician Sharyn Creamer, RN Compression Type: Four Layer Post Procedure Diagnosis Same as Pre-procedure Electronic Signature(s) Signed: 02/08/2022 4:24:19 PM By: Sharyn Creamer RN, BSN Entered By: Sharyn Creamer on 02/08/2022 15:36:36 -------------------------------------------------------------------------------- Encounter Discharge Information Details Patient Name: Date of Service: [name]. 02/08/2022 8:15 A M Medical Record Number: [id number] Patient Account Number: [id number] Date of Birth/Sex: Treating RN: Sep 30, 1950 (71 y.o. [name] Primary Care [name]: [name] Other Clinician: Referring [name]: Treating [name]/Extender: [name] in Treatment: 74 Encounter Discharge Information Items Post Procedure Vitals Discharge Condition: Stable Temperature (F): 98.2 Ambulatory Status: Ambulatory Pulse (bpm): 54 Discharge Destination: Home Respiratory Rate (breaths/min): 18 Transportation:  Private Auto Blood Pressure (mmHg): 118/68 Accompanied By: self Schedule Follow-up Appointment: Yes Clinical Summary of Care: Patient Declined Electronic Signature(s) Signed: 02/08/2022 4:24:19 PM By: Sharyn Creamer RN, BSN Entered By: Sharyn Creamer on 02/08/2022 15:38:32 -------------------------------------------------------------------------------- Lower Extremity Assessment Details Patient Name: Date of Service: [name]. 02/08/2022 8:15 A M Medical Record Number: [id number] Patient Account Number: [id number] Date of Birth/Sex: Treating RN: Apr 10, 1951 (71 y.o. [name] Primary Care Gerber Penza: [name] Other Clinician: Referring [name]: Treating [name]/Extender: [name] in Treatment: 43 Edema Assessment Assessed: [name]: Yes] [name]: No] Edema: [Left: Ye] [name]:  s] Calf Left: Right: Point of Measurement: 41 cm From Medial Instep 47.8 cm Ankle Left: Right: Point of Measurement: 10 cm From Medial Instep 29 cm Vascular Assessment Pulses: Dorsalis Pedis Palpable: [Left:Yes] Electronic Signature(s) Signed: 02/08/2022 4:41:53 PM By: [name] Entered By: [name] on 02/08/2022 08:26:31 -------------------------------------------------------------------------------- Multi Wound Chart Details Patient Name: Date of Service: [name]. 02/08/2022 8:15 A M Medical Record Number: [id number] Patient Account Number: [id number] Date of Birth/Sex: Treating RN: 12/07/1950 (71 y.o. M) Primary Care [name]: [name] Other Clinician: Referring [name]: Treating Weston Fulco/Extender: [name] in Treatment: 40 Vital Signs Height(in): 74 Capillary Blood Glucose(mg/dl): 191 Weight(lbs): 238 Pulse(bpm): 87 Body Mass Index(BMI): 30.6 Blood Pressure(mmHg): 118/68 Temperature(F): 98.2 Respiratory Rate(breaths/min): 18 Photos: [N/A:N/A] Left, Lateral Lower Leg Left, Medial Foot  N/A Wound Location: Bump Blister N/A Wounding Event: Cyst Diabetic Wound/Ulcer of the Lower N/A Primary Etiology: Extremity Glaucoma, Sleep Apnea, Glaucoma, Sleep Apnea, N/A Comorbid History: Hypertension, Peripheral Arterial Hypertension, Peripheral Arterial Disease, Peripheral Venous Disease, Disease, Peripheral Venous Disease, Type II Diabetes, Gout, Osteoarthritis, Type II Diabetes, Gout, Osteoarthritis, Neuropathy Neuropathy 06/03/2021 02/01/2022 N/A Date Acquired: 35 1 N/A Weeks of Treatment: Open Open N/A Wound Status: No No N/A Wound Recurrence: 15.5x1.8x0.1 0.6x0.3x0.1 N/A Measurements L x W x D (cm) 21.913 0.141 N/A A (cm) : rea 2.191 0.014 N/A Volume (cm) : -1228.90% 86.20% N/A % Reduction in Area: -66.10% 86.30% N/A % Reduction in Volume: Full Thickness With Exposed Support Grade 1 N/A Classification: Structures Medium Medium N/A Exudate A mount: Serosanguineous Serosanguineous N/A Exudate Type: red, brown red, brown N/A Exudate Color: Fibrotic scar, thickened scar Distinct, outline attached N/A Wound Margin: Large (67-100%) Large (67-100%) N/A Granulation A mount: Red Red N/A Granulation Quality: Small (1-33%) None Present (0%) N/A Necrotic A mount: Fat Layer (Subcutaneous Tissue): Yes Fat Layer (Subcutaneous Tissue): Yes N/A Exposed Structures: Fascia: No Fascia: No Tendon: No Tendon: No Muscle: No Muscle: No Joint: No Joint: No Bone: No Bone: No Small (1-33%) Medium (34-66%) N/A Epithelialization: Debridement - Selective/Open Wound Debridement - Selective/Open Wound N/A Debridement: Pre-procedure Verification/Time Out 08:37 08:37 N/A Taken: Lidocaine 5% topical ointment Lidocaine 5% topical ointment N/A Pain Control: Necrotic/Eschar, Psychologist, prison and probation services N/A Tissue Debrided: Non-Viable Tissue Non-Viable Tissue N/A Level: 27.9 0.18 N/A Debridement A (sq cm): rea Curette Curette N/A Instrument: Minimum Minimum  N/A Bleeding: Pressure Pressure N/A Hemostasis A chieved: 0 0 N/A Procedural Pain: 0 0 N/A Post Procedural Pain: Procedure was tolerated well Procedure was tolerated well N/A Debridement Treatment Response: 15.5x1.8x0.1 0.6x0.3x0.1 N/A Post Debridement Measurements L x W x D (cm) 2.191 0.014 N/A Post Debridement Volume: (cm) N/A maceration noted N/A Assessment Notes: Debridement Debridement N/A Procedures Performed: Treatment Notes Electronic Signature(s) Signed: 02/08/2022 8:47:17 AM By: Fredirick Maudlin MD FACS Entered By: Fredirick Maudlin on 02/08/2022 08:47:17 -------------------------------------------------------------------------------- Multi-Disciplinary Care Plan Details Patient Name: Date of Service: [name]. 02/08/2022 8:15 A M Medical Record Number: [id number] Patient Account Number: [id number] Date of Birth/Sex: Treating RN: 10/09/1950 (71 y.o. [name] Primary Care Dontai Pember: [name] Other Clinician: Referring Laray Rivkin: Treating Fawn Desrocher/Extender: [name] in Treatment: 109 Multidisciplinary Care Plan reviewed with physician Active Inactive Venous Leg Ulcer Nursing Diagnoses: Knowledge deficit related to disease process and management Potential for venous Insuffiency (use before diagnosis confirmed) Goals: Patient will maintain optimal edema control Date Initiated: 07/27/2021 Target Resolution Date: 02/16/2022 Goal Status: Active Interventions: Assess peripheral edema status every visit. Treatment Activities: Therapeutic compression applied : 07/27/2021  Notes: Wound/Skin Impairment Nursing Diagnoses: Impaired tissue integrity Knowledge deficit related to ulceration/compromised skin integrity Goals: Patient will have a decrease in wound volume by X% from date: (specify in notes) Date Initiated: 04/12/2021 Date Inactivated: 01/04/2022 Target Resolution Date: 04/23/2021 Goal Status: Met Patient/caregiver  will verbalize understanding of skin care regimen Date Initiated: 01/04/2022 Target Resolution Date: 03/01/2022 Goal Status: Active Ulcer/skin breakdown will have a volume reduction of 30% by week 4 Date Initiated: 04/12/2021 Date Inactivated: 04/27/2021 Target Resolution Date: 04/27/2021 Goal Status: Unmet Unmet Reason: infection Ulcer/skin breakdown will have a volume reduction of 50% by week 8 Date Initiated: 04/27/2021 Date Inactivated: 06/29/2021 Target Resolution Date: 06/24/2021 Goal Status: Met Interventions: Assess patient/caregiver ability to obtain necessary supplies Assess patient/caregiver ability to perform ulcer/skin care regimen upon admission and as needed Assess ulceration(s) every visit Notes: Electronic Signature(s) Signed: 02/08/2022 4:24:19 PM By: Sharyn Creamer RN, BSN Entered By: Sharyn Creamer on 02/08/2022 08:43:03 -------------------------------------------------------------------------------- Pain Assessment Details Patient Name: Date of Service: [name]. 02/08/2022 8:15 A M Medical Record Number: [id number] Patient Account Number: [id number] Date of Birth/Sex: Treating RN: Jul 05, 1950 (71 y.o. [name] Primary Care Jourdin Connors: [name] Other Clinician: Referring Eugenia Eldredge: Treating Leonel Mccollum/Extender: [name] in Treatment: 66 Active Problems Location of Pain Severity and Description of Pain Patient Has Paino Yes Site Locations Pain Location: Pain in Ulcers With Dressing Change: Yes Rate the pain. Current Pain Level: 7 Character of Pain Describe the Pain: Tender, Throbbing Pain Management and Medication Current Pain Management: Medication: Yes Cold Application: No Rest: Yes Massage: No Activity: No T.E.N.S.: No Heat Application: No Leg drop or elevation: No Is the Current Pain Management Adequate: Adequate How does your wound impact your activities of daily livingo Sleep: No Bathing:  No Appetite: No Relationship With Others: No Bladder Continence: No Emotions: No Bowel Continence: No Work: No Toileting: No Drive: No Dressing: No Hobbies: No Electronic Signature(s) Signed: 02/08/2022 4:41:53 PM By: [name] Entered By: [name] on 02/08/2022 08:17:03 -------------------------------------------------------------------------------- Patient/Caregiver Education Details Patient Name: Date of Service: [place], [name] 8/17/2023andnbsp8:15 A M Medical Record Number: [id number] Patient Account Number: [id number] Date of Birth/Gender: Treating RN: [birth year] (71 y.o. [name] Primary Care Physician: [name] Other Clinician: Referring Physician: Treating Physician/Extender: [name] in Treatment: 52 Education Assessment Education Provided To: Patient Education Topics Provided Wound/Skin Impairment: Methods: Explain/Verbal Responses: State content correctly [employer]) Signed: 02/08/2022 4:24:19 PM By: Sharyn Creamer RN, BSN Entered By: Sharyn Creamer on 02/08/2022 15:37:06 -------------------------------------------------------------------------------- Wound Assessment Details Patient Name: Date of Service: [name]. 02/08/2022 8:15 A M Medical Record Number: [id number] Patient Account Number: [id number] Date of Birth/Sex: Treating RN: [birth year] (71 y.o. [name] Primary Care Dinita Migliaccio: [name] Other Clinician: Referring [name] Modisette: Treating Amillia Biffle/Extender: [name] in Treatment: 43 Wound Status Wound Number: 22 Primary Cyst Etiology: Wound Location: Left, Lateral Lower Leg Wound Open Wounding Event: Bump Status: Date Acquired: 06/03/2021 Comorbid Glaucoma, Sleep Apnea, Hypertension, Peripheral Arterial Disease, Weeks Of Treatment: 35 History: Peripheral Venous Disease, Type II Diabetes, Gout, Osteoarthritis, Clustered Wound: No  Neuropathy Photos Wound Measurements Length: (cm) 15.5 Width: (cm) 1.8 Depth: (cm) 0.1 Area: (cm) 21.913 Volume: (cm) 2.191 % Reduction in Area: -1228.9% % Reduction in Volume: -66.1% Epithelialization: Small (1-33%) Tunneling: No Undermining: No Wound Description Classification: Full Thickness With Exposed Support Structures Wound Margin: Fibrotic scar, thickened scar Exudate Amount: Medium Exudate Type: Serosanguineous Exudate Color: red, brown Foul Odor  After Cleansing: No Slough/Fibrino Yes Wound Bed Granulation Amount: Large (67-100%) Exposed Structure Granulation Quality: Red Fascia Exposed: No Necrotic Amount: Small (1-33%) Fat Layer (Subcutaneous Tissue) Exposed: Yes Necrotic Quality: Adherent Slough Tendon Exposed: No Muscle Exposed: No Joint Exposed: No Bone Exposed: No Treatment Notes Wound #22 (Lower Leg) Wound Laterality: Left, Lateral Cleanser Soap and Water Discharge Instruction: May shower and wash wound with dial antibacterial soap and water prior to dressing change. Wound Cleanser Discharge Instruction: Cleanse the wound with wound cleanser prior to applying a clean dressing using gauze sponges, not tissue or cotton balls. Peri-Wound Care Triamcinolone 15 (g) Discharge Instruction: Use triamcinolone 15 (g) as directed Sween Lotion (Moisturizing lotion) Discharge Instruction: Apply moisturizing lotion to the leg Topical Primary Dressing KerraCel Ag Gelling Fiber Dressing, 4x5 in (silver alginate) Discharge Instruction: Apply silver alginate to wound bed as instructed Secondary Dressing ABD Pad, 8x10 Discharge Instruction: Apply over primary dressing as directed. Secured With Elastic Bandage 4 inch (ACE bandage) Discharge Instruction: Secure with ACE bandage as directed. Compression Wrap CoFlex TLC XL 2-layer Compression System 4x7 (in/yd) Discharge Instruction: Apply CoFlex 2-layer compression as directed. (alt for 4 layer) Compression  Stockings Add-Ons Electronic Signature(s) Signed: 02/08/2022 4:41:53 PM By: [name] Entered By: [name] on 02/08/2022 08:30:36 -------------------------------------------------------------------------------- Wound Assessment Details Patient Name: Date of Service: [name]. 02/08/2022 8:15 A M Medical Record Number: [id number] Patient Account Number: [id number] Date of Birth/Sex: Treating RN: Apr 12, 1951 (71 y.o. [name] Primary Care Fizza Scales: [name] Other Clinician: Referring Armany Mano: Treating Yitzchak Kothari/Extender: [name] in Treatment: 43 Wound Status Wound Number: 27 Primary Diabetic Wound/Ulcer of the Lower Extremity Etiology: Wound Location: Left, Medial Foot Wound Open Wounding Event: Blister Status: Date Acquired: 02/01/2022 Comorbid Glaucoma, Sleep Apnea, Hypertension, Peripheral Arterial Disease, Weeks Of Treatment: 1 History: Peripheral Venous Disease, Type II Diabetes, Gout, Osteoarthritis, Clustered Wound: No Neuropathy Photos Wound Measurements Length: (cm) 0.6 Width: (cm) 0.3 Depth: (cm) 0.1 Area: (cm) 0.141 Volume: (cm) 0.014 % Reduction in Area: 86.2% % Reduction in Volume: 86.3% Epithelialization: Medium (34-66%) Tunneling: No Undermining: No Wound Description Classification: Grade 1 Wound Margin: Distinct, outline attached Exudate Amount: Medium Exudate Type: Serosanguineous Exudate Color: red, brown Foul Odor After Cleansing: No Slough/Fibrino No Wound Bed Granulation Amount: Large (67-100%) Exposed Structure Granulation Quality: Red Fascia Exposed: No Necrotic Amount: None Present (0%) Fat Layer (Subcutaneous Tissue) Exposed: Yes Tendon Exposed: No Muscle Exposed: No Joint Exposed: No Bone Exposed: No Assessment Notes maceration noted Treatment Notes Wound #27 (Foot) Wound Laterality: Left, Medial Cleanser Peri-Wound Care Sween Lotion (Moisturizing lotion) Discharge  Instruction: Apply moisturizing lotion as directed Topical Primary Dressing KerraCel Ag Gelling Fiber Dressing, 2x2 in (silver alginate) Discharge Instruction: Apply silver alginate to wound bed as instructed Secondary Dressing Optifoam Non-Adhesive Dressing, 4x4 in Discharge Instruction: Apply over primary dressing cut to make foam donut Woven Gauze Sponges 2x2 in Discharge Instruction: Apply over primary dressing as directed. Secured With 28M Medipore H Soft Cloth Surgical T ape, 4 x 10 (in/yd) Discharge Instruction: Secure with tape as directed. Compression Wrap CoFlex TLC XL 2-layer Compression System 4x7 (in/yd) Discharge Instruction: Apply CoFlex 2-layer compression as directed. (alt for 4 layer) Compression Stockings Add-Ons Electronic Signature(s) Signed: 02/08/2022 4:41:53 PM By: [name] Entered By: [name] on 02/08/2022 08:31:08 -------------------------------------------------------------------------------- Vitals Details Patient Name: Date of Service: [name]. 02/08/2022 8:15 A M Medical Record Number: [id number] Patient Account Number: [id number] Date of Birth/Sex: Treating RN: 01-06-1951 (71 y.o. M)  [name] Primary Care Aarti Mankowski: [name] Other Clinician: Referring Dejanee Thibeaux: Treating Raeonna Milo/Extender: [name] in Treatment: 35 Vital Signs Time Taken: 08:17 Temperature (F): 98.2 Height (in): 74 Pulse (bpm): 54 Weight (lbs): 238 Respiratory Rate (breaths/min): 18 Body Mass Index (BMI): 30.6 Blood Pressure (mmHg): 118/68 Capillary Blood Glucose (mg/dl): 191 Reference Range: 80 - 120 mg / dl Electronic Signature(s) Signed: 02/08/2022 4:41:53 PM By: [name] Entered By: [name] on 02/08/2022 08:19:46

## 2022-02-08 NOTE — Progress Notes (Signed)
[name], ANGERER ([id number]) Visit Report for 02/08/2022 Chief Complaint Document Details Patient Name: Date of Service: [name], UREY 02/08/2022 8:15 A M Medical Record Number: [id number] Patient Account Number: [id number] Date of Birth/Sex: Treating RN: [birth year] (71 y.o. M) Primary Care Provider: [name] Other Clinician: Referring Provider: Treating Provider/Extender: [name] in Treatment: 43 Information Obtained from: Patient Chief Complaint Left leg and foot ulcers 04/12/2021; patient is here for wounds on his left lower leg and left plantar foot over the first metatarsal head Electronic Signature(s) Signed: 02/08/2022 8:47:25 AM By: Fredirick Maudlin MD FACS Entered By: Fredirick Maudlin on 02/08/2022 08:47:25 -------------------------------------------------------------------------------- Debridement Details Patient Name: Date of Service: [name]. 02/08/2022 8:15 A M Medical Record Number: [id number] Patient Account Number: [id number] Date of Birth/Sex: Treating RN: October 16, 1950 (71 y.o. [name] Primary Care Provider: [name] Other Clinician: Referring Provider: Treating Provider/Extender: [name] in Treatment: 43 Debridement Performed for Assessment: Wound #22 Left,Lateral Lower Leg Performed By: Physician Fredirick Maudlin, MD Debridement Type: Debridement Level of Consciousness (Pre-procedure): Awake and Alert Pre-procedure Verification/Time Out Yes - 08:37 Taken: Start Time: 08:37 Pain Control: Lidocaine 5% topical ointment T Area Debrided (L x W): otal 15.5 (cm) x 1.8 (cm) = 27.9 (cm) Tissue and other material debrided: Non-Viable, Eschar, Slough, Slough Level: Non-Viable Tissue Debridement Description: Selective/Open Wound Instrument: Curette Bleeding: Minimum Hemostasis Achieved: Pressure Procedural Pain: 0 Post Procedural Pain: 0 Response to Treatment: Procedure was tolerated  well Level of Consciousness (Post- Awake and Alert procedure): Post Debridement Measurements of Total Wound Length: (cm) 15.5 Width: (cm) 1.8 Depth: (cm) 0.1 Volume: (cm) 2.191 Character of Wound/Ulcer Post Debridement: Improved Post Procedure Diagnosis Same as Pre-procedure Electronic Signature(s) Signed: 02/08/2022 12:42:48 PM By: Fredirick Maudlin MD FACS Signed: 02/08/2022 4:24:19 PM By: Sharyn Creamer RN, BSN Entered By: Sharyn Creamer on 02/08/2022 08:39:24 -------------------------------------------------------------------------------- Debridement Details Patient Name: Date of Service: [name]. 02/08/2022 8:15 A M Medical Record Number: [id number] Patient Account Number: [id number] Date of Birth/Sex: Treating RN: 1950-09-29 (71 y.o. [name] Primary Care Provider: [name] Other Clinician: Referring Provider: Treating Provider/Extender: [name] in Treatment: 43 Debridement Performed for Assessment: Wound #27 Left,Medial Foot Performed By: Physician Fredirick Maudlin, MD Debridement Type: Debridement Severity of Tissue Pre Debridement: Fat layer exposed Level of Consciousness (Pre-procedure): Awake and Alert Pre-procedure Verification/Time Out Yes - 08:37 Taken: Start Time: 08:37 Pain Control: Lidocaine 5% topical ointment T Area Debrided (L x W): otal 0.6 (cm) x 0.3 (cm) = 0.18 (cm) Tissue and other material debrided: Non-Viable, Eschar Level: Non-Viable Tissue Debridement Description: Selective/Open Wound Instrument: Curette Bleeding: Minimum Hemostasis Achieved: Pressure Procedural Pain: 0 Post Procedural Pain: 0 Response to Treatment: Procedure was tolerated well Level of Consciousness (Post- Awake and Alert procedure): Post Debridement Measurements of Total Wound Length: (cm) 0.6 Width: (cm) 0.3 Depth: (cm) 0.1 Volume: (cm) 0.014 Character of Wound/Ulcer Post Debridement: Improved Severity of Tissue Post  Debridement: Fat layer exposed Post Procedure Diagnosis Same as Pre-procedure Electronic Signature(s) Signed: 02/08/2022 12:42:48 PM By: Fredirick Maudlin MD FACS Signed: 02/08/2022 4:24:19 PM By: Sharyn Creamer RN, BSN Entered By: Sharyn Creamer on 02/08/2022 08:41:58 -------------------------------------------------------------------------------- HPI Details Patient Name: Date of Service: [name]. 02/08/2022 8:15 A M Medical Record Number: [id number] Patient Account Number: [id number] Date of Birth/Sex: Treating RN: 1951/03/29 (71 y.o. M) Primary Care Provider: Other Clinician: [name] Referring Provider: Treating Provider/Extender: [name] in Treatment:  43 History of Present Illness HPI Description: 10/11/17; [name] is a 71 year old man who tells me that in 2015 he slipped down the latter traumatizing his left leg. He developed a wound in the same spot the area that we are currently looking at. He states this closed over for the most part although he always felt it was somewhat unstable. In 2016 he hit the same area with the door of his car had this reopened. He tells me that this is never really closed although sometimes an inflow it remains open on a constant basis. He has not been using any specific dressing to this except for topical antibiotics the nature of which were not really sure. His primary doctor did send him to see Dr. [name] of interventional cardiology. He underwent an angiogram on 08/06/17 and he underwent a PTA and directional atherectomy of the lesser distal SFA and popliteal arteries which resulted in brisk improvement in blood flow. It was noted that he had 2 vessel runoff through the anterior tibial and peroneal. He is also been to see vascular and interventional radiologist. He was not felt to have any significant superficial venous insufficiency. Presumably is not a candidate for any ablation. It was suggested he come here for  wound care. The patient is a type II diabetic on insulin. He also has a history of venous insufficiency. ABIs on the left were noncompressible in our clinic 10/21/17; patient we admitted to the clinic last week. 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 We put Iodosorb on him after an aggressive debridement and 3 layer compression. He complained of pain in his ankle and itching with is skin in fact he scratched the area on the medial calf superiorly at the rim of our wraps and he has 2 small open areas in that location today which are new. I changed his primary dressing today to silver collagen. As noted he is already had revascularization and does not have any significant superficial venous insufficiency that would be amenable to ablation 10/28/17; patient admitted to the clinic 2 weeks ago. He has a smaller Wound. Scratch injury from last week revealed. There is large wound over the tibial area. This is smaller. Granulation looks healthy. No need for debridement. 11/04/17; the wound on the left lateral calf looks better. Improved dimensions. Surface of this looks better. We've been maintaining him and Kerlix Coban wraps. He finds this much more comfortable. Silver collagen dressing 11/11/17; left lateral Wound continues to look healthy be making progress. Using a #5 curet I removed removed nonviable skin from the surface of the wound and then necrotic debris from the wound surface. Surface of the wound continues to look healthy. He also has an open area on the left great toenail bed. We've been using topical antibiotics. 11/19/17; left anterior lateral wound continues to look healthy but it's not closed. He also had a small wound above this on the left leg Initially traumatic wounds in the setting of significant chronic venous insufficiency and stasis dermatitis 11/25/17; left anterior wounds superiorly is closed still a small wound  inferiorly. 12/02/17; left anterior tibial area. Arrives today with adherent callus. Post debridement clearly not completely closed. Hydrofera Blue under 3 layer compression. 12/09/17; left anterior tibia. Circumferential eschar however the wound bed looks stable to improved. We've been using Hydrofera Blue under 3 layer compression 12/17/17; left anterior tibia. Apparently this was felt to be closed however when the  wrap was taken off there is a skin tear to reopen wounds in the same area we've been using Hydrofera Blue under 3 layer compression 12/23/17 left anterior tibia. Not close to close this week apparently the [facility] was stuck to this again. Still circumferential eschar requiring debridement. I put a contact layer on this this time under the Hydrofera Blue 12/31/17; left anterior tibia. Wound is better slight amount of hyper-granulation. Using Hydrofera Blue over Adaptic. 01/07/18; left anterior tibia. The wound had some surface eschar however after this was removed he has no open wound.he was already revascularized by Dr. [name] when he came to our clinic with atherectomy of the left SFA and popliteal artery. He was also sent to interventional radiology for venous reflux studies. He was not felt to have significant reflux but certainly has chronic venous changes of his skin with hemosiderin deposition around this area. He will definitely need to lubricate his skin and wear compression stocking and I've talked to him about this. READMISSION 05/26/2018 This is a now 71 year old man we cared for with traumatic wounds on his left anterior lower extremity. He had been previously revascularized during that admission by Dr. [name]. Apparently in follow-up Dr. [name] noted that he had deterioration in his arterial status. He underwent a stent placement in the distal left SFA on 04/22/2018. 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 He went back to the angiography suite on 04/30/2018 he  underwent PTA and balloon angioplasty of the occluded left mid anterior tibial artery, thrombotic occlusion went from 100 to 0% which reconstitutes the posterior tibial artery. He had thrombectomy and aspiration of the peroneal artery. The stent placed in the distal SFA left SFA was still occluded. 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 He has been recuperating at [facility] after the most recent hospitalization. He is going home tomorrow. He tells me that 2 weeks ago he traumatized the tip of his left fifth toe. He came in urgently for our review of this. This was a history of before I noted that Dr. [name] had already noted dry gangrenous changes of the left fifth toe 06/09/2018; 2-week follow-up. I did contact Dr. [name] after his last appointment and he apparently saw 1 of Dr. Irven Shelling colleagues the next day. He does not follow-up with Dr. [name] himself until Thursday of this week. 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 Nevertheless there is no evidence of infection no drainage and no pain. He had a new area that this week when we were signing him in today on the left anterior mid tibia area, this is in close proximity to the previous wound we have dealt with in this clinic. 06/23/2018; 2-week follow-up. I did not receive a recent note from Dr. [name] to review today. Our office is trying to obtain this. He is apparently not planning to do further vascular interventions and wondered about compression to try and help with the patient's chronic venous insufficiency. However we are also concerned about the arterial flow. He arrives in clinic today with a new area on the left third toe. The areas on the  calf/anterior tibia are close to closing. The left fifth toe  is still mummified using Betadine. -In reviewing things with the patient he has what sounds like claudication with mild to moderate amount of activity. 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 I prescribed Levaquin over the phone while we attempted to arrange a plan of care. However the patient called yesterday to report he had low-grade fever and he came in today acutely. There is been a marked deterioration in the left third toe with spreading cellulitis up into the dorsal left foot. He was referred to the emergency room. Readmission: 06/29/2020 patient presents today for reevaluation here in our clinic he was previously treated by Dr. Dellia Nims at the latter part of 2019 in 2 the beginning of 2020. Subsequently we have not seen him since that time in the interim he did have evaluation with vein and vascular specialist specifically Dr. Anice Paganini who did perform quite extensive work for a left femoral to anterior tibial artery bypass. With that being said in the interim the patient has developed significant lymphedema and has wounds that he tells me have really never healed in regard to the incision site on the left leg. He also has multiple wounds on the feet for various reasons some of which is that he tends to pick at his feet. Fortunately there is no signs of active infection systemically at this time he does have some wounds that are little bit deeper but most are fairly superficial he seems to have good blood flow and overall everything appears to be healthy I see no bone exposed and no obvious signs of osteomyelitis. I do not know that he necessarily needs a x-ray at this point although that something we could consider depending on how things progress. The patient does have a history of lymphedema, diabetes, this is type II, chronic kidney disease stage III, hypertension, and history of peripheral  vascular disease. 07/05/2020; patient admitted last week. Is a patient I remember from 2019 he had a spreading infection involving the left foot and we sent him to the hospital. 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 He subsequently had a left femoral to anterior tibial bypass by Dr.Cain vein and vascular. He also has severe lymphedema with chronic skin changes related to that on the left leg. The most problematic area that was new today was on the left medial great toe. This was apparently a small area last week there was purulent drainage which our intake nurse cultured. Also areas on the left medial foot and heel left lateral foot. He has 2 areas on the left medial calf left lateral calf in the setting of the severe lymphedema. 07/13/2020 on evaluation today patient appears to be doing better in my opinion compared to his last visit. The good news is there is no signs of active infection systemically and locally I do not see any signs of infection either. He did have an x-ray which was negative that is great news he had a culture which showed MRSA but at the same time he is been on the doxycycline which has helped. I do think we may want to extend this for 7 additional days 1/25; patient admitted to the clinic a few weeks ago. 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 We have been putting him under compression his edema control is a lot better but he is severe verricused skin on the left leg. He is really  done quite well he still has an open area on the left medial calf and the left medial first metatarsal head. We have been using silver collagen on the leg silver alginate on the foot 07/27/2020 upon evaluation today patient appears to be doing decently well in regard to his wounds. He still has a lot of dry skin on the left leg. Some of this is starting to peel back and I think he may be able to have them out by  removing some that today. Fortunately there is no signs of active infection at this time on the left leg although on the right leg he does appear to have swelling and erythema as well as some mild warmth to touch. This does have been concerned about the possibility of cellulitis although within the differential diagnosis I do think that potentially a DVT has to be at least considered. We need to rule that out before proceeding would just call in the cellulitis. Especially since he is having pain in the posterior aspect of his calf muscle. 2/8; the patient had seen sparingly. He has severe skin changes of chronic lymphedema in the left leg thickened hyperkeratotic verrucous skin. He has an open wound on the medial part of the left first met head left mid tibia. He also has a rim of nonepithelialized skin in the anterior mid tibia. He brought in the AmLactin lotion that was been prescribed although I am not sure under compression and its utility. There concern about cellulitis on the right lower leg the last time he was here. He was put on on antibiotics. His DVT rule out was negative. The right leg looks fine he is using his stocking on this area 08/10/2020 upon evaluation today patient appears to be doing well with regard to his leg currently. He has been tolerating the dressing changes without complication. Fortunately there is no signs of active infection which is great news. Overall very pleased with where things stand. 2/22; the patient still has an area on the medial part of the left first met his head. This looks better than when I last saw this earlier this month he has a rim of epithelialization but still some surface debris. Mostly everything on the left leg is healed. There is still a vulnerable in the left mid tibia area. 08/30/2020 upon evaluation today patient appears to be doing much better in regard to his wounds on his foot. Fortunately there does not appear to be any signs of active  infection systemically though locally we did culture this last week and it does appear that he does have MRSA currently. Nonetheless I think we will address that today I Minna send in a prescription for him in that regard. Overall though there does not appear to be any signs of significant worsening. 09/07/2020 on evaluation today patient's wounds over his left foot appear to be doing excellent. I do not see any signs of infection there is some callus buildup this can require debridement for certain but overall I feel like he is managing quite nicely. He still using the AmLactin cream which has been beneficial for him as well. 3/22; left foot wound is closed. There is no open area here. He is using ammonium lactate lotion to the lower extremities to help exfoliate dry cracked skin. He has compression stockings from elastic therapy in [place]. The wound on the medial part of his left first met head is healed today. READMISSION 04/12/2021 Mr. Vint is a patient we know fairly  well he had a prolonged stay in clinic in 2019 with wounds on his left lateral and left anterior lower extremity in the setting of chronic venous insufficiency. More recently he was here earlier this year with predominantly an area on his left foot first metatarsal head plantar and he says the plantar foot broke down on its not long after we discharged him but he did not come back here. The last few months areas of broken down on his left anterior and again the left lateral lower extremity. The leg itself is very swollen chronically enlarged a lot of hyperkeratotic dry Berry Q skin in the left lower leg. His edema extends well into the thigh. He was seen by Dr. Donzetta Matters. He had ABIs on 03/02/2021 showing an ABI on the right of 1 with a TBI of 0.72 his ABI in the left at 1.09 TBI of 0.99. Monophasic and biphasic waveforms on the right. On the left monophasic waveforms were noted he went on to have an angiogram on 03/27/2021 this showed the  aortic aortic and iliac segments were free of flow-limiting stenosis the left common femoral vein to evaluate the left femoral to anterior tibial artery bypass was unobstructed the bypass was patent without any areas of stenosis. We discharged the patient in bilateral juxta lite stockings but very clearly that was not sufficient to control the swelling and maintain skin integrity. He is clearly going to need compression pumps. 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 This is fortunate because he is on his feet for long periods of time. 10/27; patient comes in with our intake nurse reporting copious amount of green drainage from the left anterior mid tibia the left dorsal foot and to a lesser extent the left medial mid tibia. We left the compression wrap on all week for the amount of edema in his left leg is quite a bit better. We use silver alginate as the primary dressing 11/3; edema control is good. Left anterior lower leg left medial lower leg and the plantar first metatarsal head. The left anterior lower leg required debridement. Deep tissue culture I did of this wound showed MRSA I put him on 10 days of doxycycline which she will start today. We have him in compression wraps. He has a security card and [employer] however he is retiring on November 15. We will need to then get him into a better offloading boot for the left foot perhaps a total contact cast 11/10; edema control is quite good. Left anterior and left medial lower leg wounds in the setting of chronic venous insufficiency and lymphedema. He also has a substantial area over the left plantar first metatarsal head. 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 He has significant hypergranulation on the left plantar foot wound. The patient is a diabetic but he does not have significant PAD 11/17;  edema control is quite good. Left anterior and left medial lower leg wounds look better. The really concerning area remains the area on the left plantar first metatarsal head. He has a rim of epithelialization. 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 Starting today with a forefoot off loader but . possibly a total contact cast. 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 Arrives with a very significant odor from I think all of the wounds on the left leg and the left foot. Because of this I did not put a total contact cast on him today but will could still consider this. His wife was having cataract surgery which is the reason he missed the appointment 12/6. I saw this man 5 days ago with a swelling below the popliteal fossa. I thought he actually might have a Baker's cyst however the DVT rule out study that we could arrange right away was negative the technician told me this was not a ruptured Baker's cyst. We attempted to get this aspirated by under ultrasound guidance in interventional radiology however all they did was an ultrasound however it shows an extensive fluid collection 62 x 8 x 9.4 in the left thigh and left calf. The patient states he thinks this started 8 days ago or so but he really is not complaining of any pain, fever or systemic symptoms. He has not ha 12/20; after some difficulty I managed to get the patient into see Dr. Donzetta Matters. Eventually he was taken into the hospital and had a drain put in the fluid collection below his left knee posteriorly extending into the posterior thigh. He still has the drain in place. Culture of this showed moderate staff aureus few Morganella and few Klebsiella he is now on doxycycline and ciprofloxacin as suggested by infectious  disease he is on this for a month. The drain will remain in place until it stops draining 12/29; he comes in today with the 1 wound on his left leg and the area on the left plantar first met head significantly smaller. Both look healthy. He still has the drain in the left leg. He says he has to change this daily. Follows up with Dr. Donzetta Matters on January 11. 06/29/2021; the wounds that I am following on the left leg and left first met head continued to be quite healthy. However the area where his inferior drain is in place had copious amounts of drainage which was green in color. The wound here is larger. Follows up with Dr. Gwenlyn Saran of vein and vascular his surgeon next week as well as infectious disease. He remains on ciprofloxacin and doxycycline. He is not complaining of excessive pain in either one of the drain areas 1/12; the patient saw vascular surgery and infectious disease. Vascular surgery has left the drain in place as there was still some notable drainage still see him back in 2 weeks. Dr. Velna Ochs stop the doxycycline and ciprofloxacin and I do not believe he follows up with them at this point. Culture I did last week showed both doxycycline resistant MRSA and Pseudomonas not sensitive to ciprofloxacin although only in rare titers 1/19; the patient's wound on the left anterior lower leg is just about healed. We have continued healing of the area that was medially on the left leg. Left first plantar metatarsal head continues to get smaller. The major problem here is his 2 drain sites 1 on the left upper calf and lateral thigh. There is purulent drainage still from the left lateral thigh. I gave him antibiotics last week but we still have recultured. He has the drain in the area I think this is eventually going to have to come out. I suspect there will be a  connecting wound to heal here perhaps with improved VAc 1/26; the patient had his drain removed by vein and vascular on 1/25/. This was a large  pocket of fluid in his left thigh that seem to tunnel into his left upper calf. 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 His mention his Penrose drain was removed today. He now has a tunneling wound on his left calf and left thigh. Both of these probe widely towards each other although I cannot really prove that they connect. Both wounds on his lower leg anteriorly are closed and his area over the first metatarsal head on his right foot continues to improve. We are using Hydrofera Blue here. He also saw infectious disease culture of the abscess they noted was polymicrobial with MRSA, Morganella and Klebsiella he was treated with doxycycline and ciprofloxacin for 4 weeks ending on 07/03/2021. They did not recommend any further antibiotics. Notable that while he still had the Penrose drain in place last week he had purulent drainage coming out of the inferior IandD site this grew [facility], MRSA and Pseudomonas but there does not appear to be any active infection in this area today with the drain out and he is not systemically unwell 2/2; with regards to the drain sites the superior one on the thigh actually is closed down the one on the upper left lateral calf measures about 8 and half centimeters which is an improvement seems to be less prominent although still with a lot of drainage. The only remaining wound is over the first metatarsal head on the left foot and this looks to be continuing to improve with Hydrofera Blue. 2/9; the area on his plantar left foot continues to contract. Callus around the wound edge. The drain sites specifically have not come down in depth. We put the wound VAC on Monday he changed the canister late last night our intake nurse reported a pocket of fluid perhaps caused by our compression wraps 2/16; continued improvement in left foot plantar wound. drainage site in the calf is not improved in terms of depth (wound vac) 2/23; continued improvement in  the left foot wound over the first metatarsal head. With regards to the drain sites the area on his thigh laterally is healed however the open area on his calf is small in terms of circumference by still probes in by about 15 cm. Within using the wound VAC. Hydrofera Blue on his foot 08/24/2021: The left first metatarsal head wound continues to improve. The wound bed is healthy with just some surrounding callus. Unfortunately the open drain site on his calf remains open and tunnels at least 15 cm (the extent of a Q-tip). This is despite several weeks of wound VAC treatment. Based on reading back through the notes, there has been really no significant change in the depth of the wound, although the orifice is smaller and the more cranial wound on his thigh has closed. I suspect the tunnel tracks nearly all the way to this location. 08/31/2021: Continued improvement in the left first metatarsal head wound. There has been absolutely no improvement to the long tunnel from his open drain site on his calf. We have tried to get him into see vascular surgery sooner to consider the possibility of simply filleting the tract open and allowing it to heal from the bottom up, likely with a wound VAC. They have not yet scheduled a sooner appointment than his current mid April 09/14/2021: He was seen by vascular  surgery and they took him to the operating room last week. They opened a portion of the tunnel, but did not extend the entire length of the known open subcutaneous tract. I read Dr. Claretha Cooper operative note and it is not clear from that documentation why only a portion of the tract was opened. The heaped up granulation tissue was curetted and removed from at least some portion of the tract. They did place a wound VAC and applied an Unna boot to the leg. The ulcer on his left first metatarsal head is smaller today. The bed looks good and there is just a small amount of surrounding callus. 09/21/2021: The ulcer on his  left first metatarsal head looks to be stalled. There is some callus surrounding the wound but the wound bed itself does not appear particularly dynamic. The tunnel tract on his lateral left leg seems to be roughly the same length or perhaps slightly smaller but the wound bed appears healthy with good granulation tissue. He opened up a new wound on his medial thigh and the site of a prior surgical incision. He says that he did this unconsciously in his sleep by scratching. 09/28/2021: Unfortunately, the ulcer on his left first metatarsal head has extended underneath the callus toward the dorsum of his foot. The medial thigh wounds are roughly the same. The tunnel on his lateral left leg continues to be problematic; it is longer than we are able to actually probe with a Q-tip. I am still not certain as to why Dr. Donzetta Matters did not open this up entirely when he took the patient to the operating room. We will likely be back in the same situation with just a small superficial opening in a long unhealed tract, as the open portion is granulating in nicely. 10/02/2021: The patient was initially scheduled for a nurse visit, but we are also applying a total contact cast today. The plantar foot wound looks clean without significant accumulated callus. We have been applying Prisma silver collagen to the site. 10/05/2021: The patient is here for his first total contact cast change. We have tried using gauze packing strips in the tunnel on his lateral leg wound, but this does not seem to be working any better than the white VAC foam. The foot ulcer looks about the same with minimal periwound callus. Medial thigh wound is clean with just some overlying eschar. 10/12/2021: The plantar foot wound is stable without any significant accumulation of periwound callus. The surface is viable with good granulation tissue. The medial thigh wounds are much smaller and are epithelializing. On the other hand, he had purulent drainage  coming from the tunnel on his lateral leg. He does go back to see Dr. Donzetta Matters next week and is planning to ask him why the wound tunnel was not completely opened at the time of his most recent operation. 10/19/2021: The plantar foot wound is markedly improved and has epithelial tissue coming through the surface. The medial thigh wounds are nearly closed with just a tiny open area. He did see Dr. Donzetta Matters earlier this week and apparently they did discuss the possibility of opening the sinus tract further and enabling a wound VAC application. Apparently there are some limits as to what Dr. 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 I think if we could get the tract open to the level of the popliteal fossa, this would greatly aid in her ability to get this chart closed. That being said, however, today when  I probed the tract with a Q-tip, I was not able to insert the entirety of the Q-tip as I have on previous occasions. The tunnel is shorter by about 4 cm. The surface is clean with good granulation tissue and no further episodes of purulent drainage. 10/30/2021: Last week, the patient underwent surgery and had the long tract in his leg opened. There was a rind that was debrided, according to the operative report. His medial thigh ulcers are closed. The plantar foot wound is clean with a good surface and some built up surrounding callus. 11/06/2021: The overall dimensions of the large wound on his lateral leg remain about the same, but there is good granulation tissue present and the tunneling is a little bit shorter. He has a new wound on his anterior tibial surface, in the same location where he had a similar lesion in the past. The plantar foot wound is clean with some buildup surrounding callus. Just toward the medial aspect of his foot, however, there is an area of darkening that once debrided, revealed another opening in the skin surface. 11/13/2021: The anterior tibial  surface wound is closed. The plantar foot wound has some surrounding callus buildup. The area of darkening that I debrided last week and revealed an opening in the skin surface has closed again. The tunnel in the large wound on his lateral leg has come in by about 3 cm. There is healthy granulation tissue on the entire wound surface. 11/23/2021: The patient was out of town last week and did wet-to-dry dressings on his large wound. 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 Unfortunately, he picked open the wound on his left medial thigh. He says that it was itching and he just could not stop scratching it until it was open again. The wound on his plantar foot is smaller and has not accumulated a tremendous amount of callus. The lateral leg wound is shallower and the tunnel has also decreased in depth. There is just a little bit of slough accumulation on the surface. 11/30/2021: Another portion of his left medial thigh has been opened up. All of these wounds are fairly superficial with just a little bit of slough and eschar accumulation. The wound on his plantar foot is almost closed with just a bit of eschar and periwound callus accumulation. The lateral leg wound is nearly flush with the surrounding skin and the tunnel is markedly shallower. 12/07/2021: There is just 1 open area on his left medial thigh. It is clean with just a little bit of perimeter eschar. The wound on his plantar foot continues to contract and just has some eschar and periwound callus accumulation. The lateral leg wound is closing at the more distal aspect and the tunnel is smaller. The surface is nearly flush with the surrounding skin and it has a good bed of granulation tissue. 12/14/2021: The thigh and foot wounds are closed. The lateral leg wound has closed over approximately half of its length. The tunnel continues to contract and the surface is now flush with the  surrounding skin. The wound bed has robust granulation tissue. 12/22/2021: The thigh and foot wounds have reopened. The foot wound has a lot of callus accumulation around and over it. The thigh wound is tiny with just a little bit of slough in the wound bed. The lateral leg wound continues to contract. His vascular surgeon took the wound VAC off earlier in the week  and the patient has been doing wet-to-dry dressings. There is a little slough accumulation on the surface. The tunnel is about 3 cm in depth at this point. 12/28/2021: The thigh wound is closed again. The foot wound has some callus that subsequently has peeled back exposing just a small slit of a wound. The lateral leg wound Is down to about half the size that it originally was and the tunnel is down to about half a centimeter in depth. 01/04/2022: The thigh wound remains closed. The foot wound has heavy callus overlying the wound site. Once this was debrided, the wound was found to be closed. The lateral leg wound is smaller again this week and very superficial. No tunnel could be identified. 01/12/2022: The thigh and foot wounds both remain closed. The lateral leg wound is now nearly flush with the skin surface. There is good granulation tissue present with a light layer of slough. 01/19/2022: Due to the way his wrap was placed, the patient did not change the dressing on his thigh at all and so the foam was saturated and his skin is macerated. There is a light layer of slough on the wound surface. The underlying granulation tissue is robust and healthy-appearing. He has heavy callus buildup at the site of his first metatarsal head wound which is still healed. 02/01/2022: He has been in silver alginate. When he removed the dressing from his thigh wound, however, some leg, superficially reopening a portion of the wound that had healed. In addition, underneath the callus at his left first metatarsal head, there appears to be a blister and the wound  appears to be open again. 02/08/2022: The lateral leg wound has contracted substantially. There is eschar and a light layer of slough present. He says that it is starting to pull and is uncomfortable. On inspection, there is some puckering of the scar and the eschar is quite dry; this may account for his symptoms. On his first metatarsal head, the wound is much smaller with just some eschar on the surface. The callus has not reaccumulated. He reports that he had a blister come up on his medial thigh wound at the distal aspect. It popped and there is now an opening in his skin again. Looking back through his [place] of wound photos, there is what looks like a permanent suture just deep to this location and it may be trying to erode through. We have been using silver alginate on his wounds. Electronic Signature(s) Signed: 02/08/2022 8:53:39 AM By: Fredirick Maudlin MD FACS Entered By: Fredirick Maudlin on 02/08/2022 08:53:38 -------------------------------------------------------------------------------- Physical Exam Details Patient Name: Date of Service: [name]. 02/08/2022 8:15 A M Medical Record Number: [id number] Patient Account Number: [id number] Date of Birth/Sex: Treating RN: 19-Aug-1950 (70 y.o. M) Primary Care Provider: [name] Other Clinician: Referring Provider: Treating Provider/Extender: [name] in Treatment: 94 Constitutional . Bradycardic, asymptomatic.. . . No acute distress.Marland Kitchen Respiratory Normal work of breathing on room air.. Notes 02/08/2022: The lateral leg wound has contracted substantially. There is eschar and a light layer of slough present. On his first metatarsal head, the wound is much smaller with just some eschar on the surface. The callus has not reaccumulated. He reports that he had a blister come up on his medial thigh wound at the distal aspect. It popped and there is now an opening in his skin again. Electronic  Signature(s) Signed: 02/08/2022 8:57:32 AM By: Fredirick Maudlin MD FACS Entered By: Fredirick Maudlin on 02/08/2022 08:57:32 --------------------------------------------------------------------------------  Physician Orders Details Patient Name: Date of Service: ROLDAN, LAFOREST 02/08/2022 8:15 A M Medical Record Number: [id number] Patient Account Number: [id number] Date of Birth/Sex: Treating RN: 15-Apr-1951 (71 y.o. [name] Primary Care Provider: [name] Other Clinician: Referring Provider: Treating Provider/Extender: [name] in Treatment: 60 Verbal / Phone Orders: No Diagnosis Coding ICD-10 Coding Code Description 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 I89.0 Lymphedema, not elsewhere classified I87.322 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verity 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 - Dr Celine Ahr - Room 1 Return A Bathing/ Shower/ Hygiene May shower with protection but do not get wound dressing(s) wet. - Use a cast protector so you can shower without getting your wrap(s) wet Edema Control - Lymphedema / SCD / Other Elevate legs to the level of the heart or above for 30 minutes daily and/or when sitting, a frequency of: - throughout the day Avoid standing for long periods of time. Patient to wear own compression stockings every day. - on right leg; Moisturize legs daily. - Ammonium lactate to right leg daily Wound Treatment Wound #22 - Lower Leg Wound Laterality: Left, Lateral Cleanser: Soap and Water 3 x Per Week/30 Days Discharge Instructions: May shower and wash wound with dial  antibacterial soap and water prior to dressing change. Cleanser: Wound Cleanser 3 x Per Week/30 Days Discharge Instructions: Cleanse the wound with wound cleanser prior to applying a clean dressing using gauze sponges, not tissue or cotton balls. Peri-Wound Care: Triamcinolone 15 (g) 3 x Per Week/30 Days Discharge Instructions: Use triamcinolone 15 (g) as directed Peri-Wound Care: Sween Lotion (Moisturizing lotion) 3 x Per Week/30 Days Discharge Instructions: Apply moisturizing lotion to the leg Prim Dressing: KerraCel Ag Gelling Fiber Dressing, 4x5 in (silver alginate) 3 x Per Week/30 Days ary Discharge Instructions: Apply silver alginate to wound bed as instructed Secondary Dressing: ABD Pad, 8x10 (Generic) 3 x Per Week/30 Days Discharge Instructions: Apply over primary dressing as directed. Secured With: Elastic Bandage 4 inch (ACE bandage) 3 x Per Week/30 Days Discharge Instructions: Secure with ACE bandage as directed. Compression Wrap: CoFlex TLC XL 2-layer Compression System 4x7 (in/yd) 3 x Per Week/30 Days Discharge Instructions: Apply CoFlex 2-layer compression as directed. (alt for 4 layer) Wound #27 - Foot Wound Laterality: Left, Medial Peri-Wound Care: Sween Lotion (Moisturizing lotion) 1 x Per Week Discharge Instructions: Apply moisturizing lotion as directed Prim Dressing: KerraCel Ag Gelling Fiber Dressing, 2x2 in (silver alginate) 1 x Per Week ary Discharge Instructions: Apply silver alginate to wound bed as instructed Secondary Dressing: Optifoam Non-Adhesive Dressing, 4x4 in 1 x Per Week Discharge Instructions: Apply over primary dressing cut to make foam donut Secondary Dressing: Woven Gauze Sponges 2x2 in 1 x Per Week Discharge Instructions: Apply over primary dressing as directed. Secured With: 74M Medipore H Soft Cloth Surgical T ape, 4 x 10 (in/yd) 1 x Per Week Discharge Instructions: Secure with tape as directed. Compression Wrap: CoFlex TLC XL 2-layer Compression  System 4x7 (in/yd) 1 x Per Week Discharge Instructions: Apply CoFlex 2-layer compression as directed. (alt for 4 layer) Electronic Signature(s) Signed: 02/08/2022 12:42:48 PM By: Fredirick Maudlin MD FACS Entered By: Fredirick Maudlin on 02/08/2022 08:57:49 -------------------------------------------------------------------------------- Problem List Details  Patient Name: Date of Service: KALUB, MORILLO 02/08/2022 8:15 A M Medical Record Number: [id number] Patient Account Number: [id number] Date of Birth/Sex: Treating RN: 1950-09-08 (71 y.o. M) Primary Care Provider: [name] Other Clinician: Referring Provider: Treating Provider/Extender: [name] in Treatment: 57 Active Problems ICD-10 Encounter Code Description Active Date MDM Diagnosis 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 04/12/2021 No Yes I89.0 Lymphedema, not elsewhere classified 04/12/2021 No Yes I87.322 Chronic venous hypertension (idiopathic) with inflammation of left lower 04/12/2021 No Yes extremity 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 ulcer of other part of left lower leg with other specified 04/12/2021 No Yes severity 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 of other part of left foot with other specified 04/12/2021 No Yes severity 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 07/20/2021 No Yes Inactive Problems ICD-10 Code Description Active Date Inactive Date E11.621 Type 2 diabetes mellitus with foot ulcer 04/12/2021 04/12/2021 E11.42 Type 2 diabetes mellitus with diabetic polyneuropathy 04/12/2021 04/12/2021 L02.416 Cutaneous abscess of left lower limb 06/13/2021 06/13/2021 Resolved Problems Electronic Signature(s) Signed: 02/08/2022 8:47:01 AM By: Fredirick Maudlin MD FACS Entered By: Fredirick Maudlin on 02/08/2022 08:47:00 -------------------------------------------------------------------------------- Progress  Note Details Patient Name: Date of Service: [name]. 02/08/2022 8:15 A M Medical Record Number: [id number] Patient Account Number: [id number] Date of Birth/Sex: Treating RN: [birth year] (71 y.o. M) Primary Care Provider: [name] Other Clinician: Referring Provider: Treating Provider/Extender: [name] in Treatment: 36 Subjective Chief Complaint Information obtained from Patient Left leg and foot ulcers 04/12/2021; patient is here for wounds on his left lower leg and left plantar foot over the first metatarsal head History of Present Illness (HPI) 10/11/17; [name] is a 71 year old man who tells me that in 2015 he slipped down the latter traumatizing his left leg. He developed a wound in the same spot the area that we are currently looking at. He states this closed over for the most part although he always felt it was somewhat unstable. In 2016 he hit the same area with the door of his car had this reopened. He tells me that this is never really closed although sometimes an inflow it remains open on a constant basis. He has not been using any specific dressing to this except for topical antibiotics the nature of which were not really sure. His primary doctor did send him to see Dr. [name] of interventional cardiology. He underwent an angiogram on 08/06/17 and he underwent a PTA and directional atherectomy of the lesser distal SFA and popliteal arteries which resulted in brisk improvement in blood flow. It was noted that he had 2 vessel runoff through the anterior tibial and peroneal. He is also been to see vascular and interventional radiologist. He was not felt to have any significant superficial venous insufficiency. Presumably is not a candidate for any ablation. It was suggested he come here for wound care. The patient is a type II diabetic on insulin. He also has a history of venous insufficiency. ABIs on the left were noncompressible in our  clinic 10/21/17; patient we admitted to the clinic last week. 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 We put Iodosorb on him after an aggressive debridement and 3 layer compression. He complained of pain in his ankle and itching with is skin in fact he scratched the area on the medial calf superiorly at the rim of our wraps and  he has 2 small open areas in that location today which are new. I changed his primary dressing today to silver collagen. As noted he is already had revascularization and does not have any significant superficial venous insufficiency that would be amenable to ablation 10/28/17; patient admitted to the clinic 2 weeks ago. He has a smaller Wound. Scratch injury from last week revealed. There is large wound over the tibial area. This is smaller. Granulation looks healthy. No need for debridement. 11/04/17; the wound on the left lateral calf looks better. Improved dimensions. Surface of this looks better. We've been maintaining him and Kerlix Coban wraps. He finds this much more comfortable. Silver collagen dressing 11/11/17; left lateral Wound continues to look healthy be making progress. Using a #5 curet I removed removed nonviable skin from the surface of the wound and then necrotic debris from the wound surface. Surface of the wound continues to look healthy. ooHe also has an open area on the left great toenail bed. We've been using topical antibiotics. 11/19/17; left anterior lateral wound continues to look healthy but it's not closed. ooHe also had a small wound above this on the left leg ooInitially traumatic wounds in the setting of significant chronic venous insufficiency and stasis dermatitis 11/25/17; left anterior wounds superiorly is closed still a small wound inferiorly. 12/02/17; left anterior tibial area. Arrives today with adherent callus. Post debridement clearly not completely closed. Hydrofera Blue  under 3 layer compression. 12/09/17; left anterior tibia. Circumferential eschar however the wound bed looks stable to improved. We've been using Hydrofera Blue under 3 layer compression 12/17/17; left anterior tibia. Apparently this was felt to be closed however when the wrap was taken off there is a skin tear to reopen wounds in the same area we've been using Hydrofera Blue under 3 layer compression 12/23/17 left anterior tibia. Not close to close this week apparently the [facility] was stuck to this again. Still circumferential eschar requiring debridement. I put a contact layer on this this time under the Hydrofera Blue 12/31/17; left anterior tibia. Wound is better slight amount of hyper-granulation. Using Hydrofera Blue over Adaptic. 01/07/18; left anterior tibia. The wound had some surface eschar however after this was removed he has no open wound.he was already revascularized by Dr. [name] when he came to our clinic with atherectomy of the left SFA and popliteal artery. He was also sent to interventional radiology for venous reflux studies. He was not felt to have significant reflux but certainly has chronic venous changes of his skin with hemosiderin deposition around this area. He will definitely need to lubricate his skin and wear compression stocking and I've talked to him about this. READMISSION 05/26/2018 This is a now 71 year old man we cared for with traumatic wounds on his left anterior lower extremity. He had been previously revascularized during that admission by Dr. [name]. Apparently in follow-up Dr. [name] noted that he had deterioration in his arterial status. He underwent a stent placement in the distal left SFA on 04/22/2018. 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 He went back to the angiography suite on 04/30/2018 he underwent PTA and balloon angioplasty of the occluded left mid anterior tibial artery, thrombotic occlusion went from 100 to 0% which reconstitutes  the posterior tibial artery. He had thrombectomy and aspiration of the peroneal artery. The stent placed in the distal SFA left SFA was still occluded. He was discharged on Xarelto, it was noted on the discharge summary from this hospitalization that he had gangrene at  the tip of his left fifth toe and there were expectations this would auto amputate. 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 He has been recuperating at [place] home in [facility] after the most recent hospitalization. He is going home tomorrow. He tells me that 2 weeks ago he traumatized the tip of his left fifth toe. He came in urgently for our review of this. This was a history of before I noted that Dr. [name] had already noted dry gangrenous changes of the left fifth toe 06/09/2018; 2-week follow-up. I did contact Dr. [name] after his last appointment and he apparently saw 1 of Dr. Irven Shelling colleagues the next day. He does not follow-up with Dr. [name] himself until Thursday of this week. 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 Nevertheless there is no evidence of infection no drainage and no pain. He had a new area that this week when we were signing him in today on the left anterior mid tibia area, this is in close proximity to the previous wound we have dealt with in this clinic. 06/23/2018; 2-week follow-up. I did not receive a recent note from Dr. [name] to review today. Our office is trying to obtain this. He is apparently not planning to do further vascular interventions and wondered about compression to try and help with the patient's chronic venous insufficiency. However we are also concerned about the arterial flow. ooHe arrives in clinic today with a new area on the left third toe. The areas on the calf/anterior tibia are close to closing. The left fifth toe is still mummified using Betadine. -In reviewing things with the patient he has what  sounds like claudication with mild to moderate amount of activity. 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 I prescribed Levaquin over the phone while we attempted to arrange a plan of care. However the patient called yesterday to report he had low-grade fever and he came in today acutely. There is been a marked deterioration in the left third toe with spreading cellulitis up into the dorsal left foot. He was referred to the emergency room. Readmission: 06/29/2020 patient presents today for reevaluation here in our clinic he was previously treated by Dr. Dellia Nims at the latter part of 2019 in 2 the beginning of 2020. Subsequently we have not seen him since that time in the interim he did have evaluation with vein and vascular specialist specifically Dr. Anice Paganini who did perform quite extensive work for a left femoral to anterior tibial artery bypass. With that being said in the interim the patient has developed significant lymphedema and has wounds that he tells me have really never healed in regard to the incision site on the left leg. He also has multiple wounds on the feet for various reasons some of which is that he tends to pick at his feet. Fortunately there is no signs of active infection systemically at this time he does have some wounds that are little bit deeper but most are fairly superficial he seems to have good blood flow and overall everything appears to be healthy I see no bone exposed and no obvious signs of osteomyelitis. I do not know that he necessarily needs a x-ray at this point although that something we could consider depending on how things progress. The patient does have a history of lymphedema, diabetes, this is type II, chronic kidney disease  stage III, hypertension, and history of peripheral vascular disease. 07/05/2020; patient admitted last week. Is a patient I remember from 2019 he had a spreading infection involving the left foot and we  sent him to the hospital. 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 He subsequently had a left femoral to anterior tibial bypass by Dr.Cain vein and vascular. He also has severe lymphedema with chronic skin changes related to that on the left leg. The most problematic area that was new today was on the left medial great toe. This was apparently a small area last week there was purulent drainage which our intake nurse cultured. Also areas on the left medial foot and heel left lateral foot. He has 2 areas on the left medial calf left lateral calf in the setting of the severe lymphedema. 07/13/2020 on evaluation today patient appears to be doing better in my opinion compared to his last visit. The good news is there is no signs of active infection systemically and locally I do not see any signs of infection either. He did have an x-ray which was negative that is great news he had a culture which showed MRSA but at the same time he is been on the doxycycline which has helped. I do think we may want to extend this for 7 additional days 1/25; patient admitted to the clinic a few weeks ago. 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 We have been putting him under compression his edema control is a lot better but he is severe verricused skin on the left leg. He is really done quite well he still has an open area on the left medial calf and the left medial first metatarsal head. We have been using silver collagen on the leg silver alginate on the foot 07/27/2020 upon evaluation today patient appears to be doing decently well in regard to his wounds. He still has a lot of dry skin on the left leg. Some of this is starting to peel back and I think he may be able to have them out by removing some that today. Fortunately there is no signs of active infection at this time on the left leg although on the right leg he does appear to have  swelling and erythema as well as some mild warmth to touch. This does have been concerned about the possibility of cellulitis although within the differential diagnosis I do think that potentially a DVT has to be at least considered. We need to rule that out before proceeding would just call in the cellulitis. Especially since he is having pain in the posterior aspect of his calf muscle. 2/8; the patient had seen sparingly. He has severe skin changes of chronic lymphedema in the left leg thickened hyperkeratotic verrucous skin. He has an open wound on the medial part of the left first met head left mid tibia. He also has a rim of nonepithelialized skin in the anterior mid tibia. He brought in the AmLactin lotion that was been prescribed although I am not sure under compression and its utility. There concern about cellulitis on the right lower leg the last time he was here. He was put on on antibiotics. His DVT rule out was negative. The right leg looks fine he is using his stocking on this area 08/10/2020 upon evaluation today patient appears to be doing well with regard to his leg currently. He has been tolerating the  dressing changes without complication. Fortunately there is no signs of active infection which is great news. Overall very pleased with where things stand. 2/22; the patient still has an area on the medial part of the left first met his head. This looks better than when I last saw this earlier this month he has a rim of epithelialization but still some surface debris. Mostly everything on the left leg is healed. There is still a vulnerable in the left mid tibia area. 08/30/2020 upon evaluation today patient appears to be doing much better in regard to his wounds on his foot. 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 Nonetheless I think we will address  that today I Minna send in a prescription for him in that regard. Overall though there does not appear to be any signs of significant worsening. 09/07/2020 on evaluation today patient's wounds over his left foot appear to be doing excellent. I do not see any signs of infection there is some callus buildup this can require debridement for certain but overall I feel like he is managing quite nicely. He still using the AmLactin cream which has been beneficial for him as well. 3/22; left foot wound is closed. There is no open area here. He is using ammonium lactate lotion to the lower extremities to help exfoliate dry cracked skin. He has compression stockings from elastic therapy in [place]. The wound on the medial part of his left first met head is healed today. READMISSION 04/12/2021 [name] is a patient we know fairly well he had a prolonged stay in clinic in 2019 with wounds on his left lateral and left anterior lower extremity in the setting of chronic venous insufficiency. More recently he was here earlier this year with predominantly an area on his left foot first metatarsal head plantar and he says the plantar foot broke down on its not long after we discharged him but he did not come back here. The last few months areas of broken down on his left anterior and again the left lateral lower extremity. The leg itself is very swollen chronically enlarged a lot of hyperkeratotic dry Berry Q skin in the left lower leg. His edema extends well into the thigh. He was seen by Dr. Donzetta Matters. He had ABIs on 03/02/2021 showing an ABI on the right of 1 with a TBI of 0.72 his ABI in the left at 1.09 TBI of 0.99. Monophasic and biphasic waveforms on the right. On the left monophasic waveforms were noted he went on to have an angiogram on 03/27/2021 this showed the aortic aortic and iliac segments were free of flow-limiting stenosis the left common femoral vein to evaluate the left femoral to anterior tibial artery  bypass was unobstructed the bypass was patent without any areas of stenosis. We discharged the patient in bilateral juxta lite stockings but very clearly that was not sufficient to control the swelling and maintain skin integrity. He is clearly going to need compression pumps. 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 This is fortunate because he is on his feet for long periods of time. 10/27; patient comes in with our intake nurse reporting copious amount of green drainage from the left anterior mid tibia the left dorsal foot and to a lesser extent the left  medial mid tibia. We left the compression wrap on all week for the amount of edema in his left leg is quite a bit better. We use silver alginate as the primary dressing 11/3; edema control is good. Left anterior lower leg left medial lower leg and the plantar first metatarsal head. The left anterior lower leg required debridement. Deep tissue culture I did of this wound showed MRSA I put him on 10 days of doxycycline which she will start today. We have him in compression wraps. He has a security card and [employer] however he is retiring on November 15. We will need to then get him into a better offloading boot for the left foot perhaps a total contact cast 11/10; edema control is quite good. Left anterior and left medial lower leg wounds in the setting of chronic venous insufficiency and lymphedema. He also has a substantial area over the left plantar first metatarsal head. 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 He has significant hypergranulation on the left plantar foot wound. The patient is a diabetic but he does not have significant PAD 11/17; edema control is quite good. Left anterior and left medial lower leg wounds look better. The really concerning area remains the area on the left  plantar first metatarsal head. He has a rim of epithelialization. 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 Starting today with a forefoot off loader but . possibly a total contact cast. 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 Arrives with a very significant odor from I think all of the wounds on the left leg and the left foot. Because of this I did not put a total contact cast on him today but will could still consider this. His wife was having cataract surgery which is the reason he missed the appointment 12/6. I saw this man 5 days ago with a swelling below the popliteal fossa. I thought he actually might have a Baker's cyst however the DVT rule out study that we could arrange right away was negative the technician told me this was not a ruptured Baker's cyst. We attempted to get this aspirated by under ultrasound guidance in interventional radiology however all they did was an ultrasound however it shows an extensive fluid collection 62 x 8 x 9.4 in the left thigh and left calf. The patient states he thinks this started 8 days ago or so but he really is not complaining of any pain, fever or systemic symptoms. He has not ha 12/20; after some difficulty I managed to get the patient into see Dr. Donzetta Matters. Eventually he was taken into the hospital and had a drain put in the fluid collection below his left knee posteriorly extending into the posterior thigh. He still has the drain in place. Culture of this showed moderate staff aureus few Morganella and few Klebsiella he is now on doxycycline and ciprofloxacin as suggested by infectious disease he is on this for a month. The drain will remain in place until it stops draining 12/29; he comes in today with the 1 wound on his left leg  and the area on the left plantar first met head  significantly smaller. Both look healthy. He still has the drain in the left leg. He says he has to change this daily. Follows up with Dr. Donzetta Matters on January 11. 06/29/2021; the wounds that I am following on the left leg and left first met head continued to be quite healthy. However the area where his inferior drain is in place had copious amounts of drainage which was green in color. The wound here is larger. Follows up with Dr. Gwenlyn Saran of vein and vascular his surgeon next week as well as infectious disease. He remains on ciprofloxacin and doxycycline. He is not complaining of excessive pain in either one of the drain areas 1/12; the patient saw vascular surgery and infectious disease. Vascular surgery has left the drain in place as there was still some notable drainage still see him back in 2 weeks. Dr. Velna Ochs stop the doxycycline and ciprofloxacin and I do not believe he follows up with them at this point. Culture I did last week showed both doxycycline resistant MRSA and Pseudomonas not sensitive to ciprofloxacin although only in rare titers 1/19; the patient's wound on the left anterior lower leg is just about healed. We have continued healing of the area that was medially on the left leg. Left first plantar metatarsal head continues to get smaller. The major problem here is his 2 drain sites 1 on the left upper calf and lateral thigh. There is purulent drainage still from the left lateral thigh. I gave him antibiotics last week but we still have recultured. He has the drain in the area I think this is eventually going to have to come out. I suspect there will be a connecting wound to heal here perhaps with improved VAc 1/26; the patient had his drain removed by vein and vascular on 1/25/. This was a large pocket of fluid in his left thigh that seem to tunnel into his left upper calf. 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 His mention  his Penrose drain was removed today. He now has a tunneling wound on his left calf and left thigh. Both of these probe widely towards each other although I cannot really prove that they connect. Both wounds on his lower leg anteriorly are closed and his area over the first metatarsal head on his right foot continues to improve. We are using Hydrofera Blue here. He also saw infectious disease culture of the abscess they noted was polymicrobial with MRSA, Morganella and Klebsiella he was treated with doxycycline and ciprofloxacin for 4 weeks ending on 07/03/2021. They did not recommend any further antibiotics. Notable that while he still had the Penrose drain in place last week he had purulent drainage coming out of the inferior IandD site this grew [facility], MRSA and Pseudomonas but there does not appear to be any active infection in this area today with the drain out and he is not systemically unwell 2/2; with regards to the drain sites the superior one on the thigh actually is closed down the one on the upper left lateral calf measures about 8 and half centimeters which is an improvement seems to be less prominent although still with a lot of drainage. The only remaining wound is over the first metatarsal head on the left foot and this looks to be continuing to improve with Hydrofera Blue. 2/9; the area on his plantar left foot continues to contract. Callus around the wound edge. The drain sites specifically have not come down in depth. We put  the wound VAC on Monday he changed the canister late last night our intake nurse reported a pocket of fluid perhaps caused by our compression wraps 2/16; continued improvement in left foot plantar wound. drainage site in the calf is not improved in terms of depth (wound vac) 2/23; continued improvement in the left foot wound over the first metatarsal head. With regards to the drain sites the area on his thigh laterally is healed however the open area on his  calf is small in terms of circumference by still probes in by about 15 cm. Within using the wound VAC. Hydrofera Blue on his foot 08/24/2021: The left first metatarsal head wound continues to improve. The wound bed is healthy with just some surrounding callus. Unfortunately the open drain site on his calf remains open and tunnels at least 15 cm (the extent of a Q-tip). This is despite several weeks of wound VAC treatment. Based on reading back through the notes, there has been really no significant change in the depth of the wound, although the orifice is smaller and the more cranial wound on his thigh has closed. I suspect the tunnel tracks nearly all the way to this location. 08/31/2021: Continued improvement in the left first metatarsal head wound. There has been absolutely no improvement to the long tunnel from his open drain site on his calf. We have tried to get him into see vascular surgery sooner to consider the possibility of simply filleting the tract open and allowing it to heal from the bottom up, likely with a wound VAC. They have not yet scheduled a sooner appointment than his current mid April 09/14/2021: He was seen by vascular surgery and they took him to the operating room last week. They opened a portion of the tunnel, but did not extend the entire length of the known open subcutaneous tract. I read Dr. Claretha Cooper operative note and it is not clear from that documentation why only a portion of the tract was opened. The heaped up granulation tissue was curetted and removed from at least some portion of the tract. They did place a wound VAC and applied an Unna boot to the leg. The ulcer on his left first metatarsal head is smaller today. The bed looks good and there is just a small amount of surrounding callus. 09/21/2021: The ulcer on his left first metatarsal head looks to be stalled. There is some callus surrounding the wound but the wound bed itself does not appear particularly dynamic. The  tunnel tract on his lateral left leg seems to be roughly the same length or perhaps slightly smaller but the wound bed appears healthy with good granulation tissue. He opened up a new wound on his medial thigh and the site of a prior surgical incision. He says that he did this unconsciously in his sleep by scratching. 09/28/2021: Unfortunately, the ulcer on his left first metatarsal head has extended underneath the callus toward the dorsum of his foot. The medial thigh wounds are roughly the same. The tunnel on his lateral left leg continues to be problematic; it is longer than we are able to actually probe with a Q-tip. I am still not certain as to why Dr. Donzetta Matters did not open this up entirely when he took the patient to the operating room. We will likely be back in the same situation with just a small superficial opening in a long unhealed tract, as the open portion is granulating in nicely. 10/02/2021: The patient was initially scheduled for a  nurse visit, but we are also applying a total contact cast today. The plantar foot wound looks clean without significant accumulated callus. We have been applying Prisma silver collagen to the site. 10/05/2021: The patient is here for his first total contact cast change. We have tried using gauze packing strips in the tunnel on his lateral leg wound, but this does not seem to be working any better than the white VAC foam. The foot ulcer looks about the same with minimal periwound callus. Medial thigh wound is clean with just some overlying eschar. 10/12/2021: The plantar foot wound is stable without any significant accumulation of periwound callus. The surface is viable with good granulation tissue. The medial thigh wounds are much smaller and are epithelializing. On the other hand, he had purulent drainage coming from the tunnel on his lateral leg. He does go back to see Dr. Donzetta Matters next week and is planning to ask him why the wound tunnel was not completely opened at  the time of his most recent operation. 10/19/2021: The plantar foot wound is markedly improved and has epithelial tissue coming through the surface. The medial thigh wounds are nearly closed with just a tiny open area. He did see Dr. Donzetta Matters earlier this week and apparently they did discuss the possibility of opening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

## 2022-02-15 ENCOUNTER — Encounter (HOSPITAL_BASED_OUTPATIENT_CLINIC_OR_DEPARTMENT_OTHER): Payer: Medicare Other | Admitting: General Surgery

## 2022-02-15 DIAGNOSIS — E1151 Type 2 diabetes mellitus with diabetic peripheral angiopathy without gangrene: Secondary | ICD-10-CM | POA: Diagnosis not present

## 2022-02-15 NOTE — Progress Notes (Signed)
[name], [name] ([id number]) Visit Report for 02/15/2022 Chief Complaint Document Details Patient Name: Date of Service: [name], [name] 02/15/2022 8:15 A M Medical Record Number: [id number] Patient Account Number: [id number] Date of Birth/Sex: Treating RN: [birth year] (71 y.o. M) Primary Care Provider: [name] Other Clinician: Referring Provider: Treating Provider/Extender: [name] in Treatment: 31 Information Obtained from: Patient Chief Complaint Left leg and foot ulcers 04/12/2021; patient is here for wounds on his left lower leg and left plantar foot over the first metatarsal head Electronic Signature(s) Signed: 02/15/2022 9:20:09 AM By: Fredirick Maudlin MD FACS Entered By: Fredirick Maudlin on 02/15/2022 09:20:09 -------------------------------------------------------------------------------- Debridement Details Patient Name: Date of Service: [name]. 02/15/2022 8:15 A M Medical Record Number: [id number] Patient Account Number: [id number] Date of Birth/Sex: Treating RN: Jun 17, 1951 (71 y.o. [name] Primary Care Provider: [name] Other Clinician: Referring Provider: Treating Provider/Extender: [name] in Treatment: 44 Debridement Performed for Assessment: Wound #22 Left,Lateral Lower Leg Performed By: Physician Fredirick Maudlin, MD Debridement Type: Debridement Level of Consciousness (Pre-procedure): Awake and Alert Pre-procedure Verification/Time Out Yes - 08:55 Taken: Start Time: 08:55 Pain Control: Lidocaine 4% T opical Solution T Area Debrided (L x W): otal 10 (cm) x 1.4 (cm) = 14 (cm) Tissue and other material debrided: Non-Viable, Eschar, Slough, Slough Level: Non-Viable Tissue Debridement Description: Selective/Open Wound Instrument: Curette Bleeding: Minimum Hemostasis Achieved: Pressure Procedural Pain: 0 Post Procedural Pain: 0 Response to Treatment: Procedure was tolerated  well Level of Consciousness (Post- Awake and Alert procedure): Post Debridement Measurements of Total Wound Length: (cm) 14.8 Width: (cm) 1.4 Depth: (cm) 0.1 Volume: (cm) 1.627 Character of Wound/Ulcer Post Debridement: Improved Post Procedure Diagnosis Same as Pre-procedure Electronic Signature(s) Signed: 02/15/2022 10:03:26 AM By: Fredirick Maudlin MD FACS Signed: 02/15/2022 5:23:24 PM By: Baruch Gouty RN, BSN Entered By: Baruch Gouty on 02/15/2022 08:57:21 -------------------------------------------------------------------------------- Debridement Details Patient Name: Date of Service: [name]. 02/15/2022 8:15 A M Medical Record Number: [id number] Patient Account Number: [id number] Date of Birth/Sex: Treating RN: 03/16/1951 (71 y.o. [name] Primary Care Provider: [name] Other Clinician: Referring Provider: Treating Provider/Extender: [name] in Treatment: 44 Debridement Performed for Assessment: Wound #27 Left,Medial Foot Performed By: Physician Fredirick Maudlin, MD Debridement Type: Debridement Severity of Tissue Pre Debridement: Fat layer exposed Level of Consciousness (Pre-procedure): Awake and Alert Pre-procedure Verification/Time Out Yes - 08:55 Taken: Start Time: 08:55 Pain Control: Lidocaine 4% T opical Solution T Area Debrided (L x W): otal 2 (cm) x 1.5 (cm) = 3 (cm) Tissue and other material debrided: Non-Viable, Callus, Slough, Slough Level: Non-Viable Tissue Debridement Description: Selective/Open Wound Instrument: Curette Bleeding: Minimum Hemostasis Achieved: Pressure Procedural Pain: 0 Post Procedural Pain: 0 Response to Treatment: Procedure was tolerated well Level of Consciousness (Post- Awake and Alert procedure): Post Debridement Measurements of Total Wound Length: (cm) 0.4 Width: (cm) 1.2 Depth: (cm) 0.1 Volume: (cm) 0.038 Character of Wound/Ulcer Post Debridement: Improved Severity  of Tissue Post Debridement: Fat layer exposed Post Procedure Diagnosis Same as Pre-procedure Electronic Signature(s) Signed: 02/15/2022 10:03:26 AM By: Fredirick Maudlin MD FACS Signed: 02/15/2022 5:23:24 PM By: Baruch Gouty RN, BSN Entered By: Baruch Gouty on 02/15/2022 08:59:10 -------------------------------------------------------------------------------- HPI Details Patient Name: Date of Service: [name]. 02/15/2022 8:15 A M Medical Record Number: [id number] Patient Account Number: [id number] Date of Birth/Sex: Treating RN: 01-Jan-1951 (71 y.o. M) Primary Care Provider: Other Clinician: [name] Referring Provider: Treating Provider/Extender: [name],  [name] in Treatment: 44 History of Present Illness HPI Description: 10/11/17; Mr. Stallbaumer is a 71 year old man who tells me that in 2015 he slipped down the latter traumatizing his left leg. He developed a wound in the same spot the area that we are currently looking at. He states this closed over for the most part although he always felt it was somewhat unstable. In 2016 he hit the same area with the door of his car had this reopened. He tells me that this is never really closed although sometimes an inflow it remains open on a constant basis. He has not been using any specific dressing to this except for topical antibiotics the nature of which were not really sure. His primary doctor did send him to see Dr. [name] of interventional cardiology. He underwent an angiogram on 08/06/17 and he underwent a PTA and directional atherectomy of the lesser distal SFA and popliteal arteries which resulted in brisk improvement in blood flow. It was noted that he had 2 vessel runoff through the anterior tibial and peroneal. He is also been to see vascular and interventional radiologist. He was not felt to have any significant superficial venous insufficiency. Presumably is not a candidate for any ablation. It was suggested  he come here for wound care. The patient is a type II diabetic on insulin. He also has a history of venous insufficiency. ABIs on the left were noncompressible in our clinic 10/21/17; patient we admitted to the clinic last week. 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 We put Iodosorb on him after an aggressive debridement and 3 layer compression. He complained of pain in his ankle and itching with is skin in fact he scratched the area on the medial calf superiorly at the rim of our wraps and he has 2 small open areas in that location today which are new. I changed his primary dressing today to silver collagen. As noted he is already had revascularization and does not have any significant superficial venous insufficiency that would be amenable to ablation 10/28/17; patient admitted to the clinic 2 weeks ago. He has a smaller Wound. Scratch injury from last week revealed. There is large wound over the tibial area. This is smaller. Granulation looks healthy. No need for debridement. 11/04/17; the wound on the left lateral calf looks better. Improved dimensions. Surface of this looks better. We've been maintaining him and Kerlix Coban wraps. He finds this much more comfortable. Silver collagen dressing 11/11/17; left lateral Wound continues to look healthy be making progress. Using a #5 curet I removed removed nonviable skin from the surface of the wound and then necrotic debris from the wound surface. Surface of the wound continues to look healthy. He also has an open area on the left great toenail bed. We've been using topical antibiotics. 11/19/17; left anterior lateral wound continues to look healthy but it's not closed. He also had a small wound above this on the left leg Initially traumatic wounds in the setting of significant chronic venous insufficiency and stasis dermatitis 11/25/17; left anterior wounds superiorly is closed still a  small wound inferiorly. 12/02/17; left anterior tibial area. Arrives today with adherent callus. Post debridement clearly not completely closed. Hydrofera Blue under 3 layer compression. 12/09/17; left anterior tibia. Circumferential eschar however the wound bed looks stable to improved. We've been using Hydrofera Blue under 3 layer compression 12/17/17; left anterior tibia. Apparently this was felt to be  closed however when the wrap was taken off there is a skin tear to reopen wounds in the same area we've been using Hydrofera Blue under 3 layer compression 12/23/17 left anterior tibia. Not close to close this week apparently the [facility] was stuck to this again. Still circumferential eschar requiring debridement. I put a contact layer on this this time under the Hydrofera Blue 12/31/17; left anterior tibia. Wound is better slight amount of hyper-granulation. Using Hydrofera Blue over Adaptic. 01/07/18; left anterior tibia. The wound had some surface eschar however after this was removed he has no open wound.he was already revascularized by Dr. [name] when he came to our clinic with atherectomy of the left SFA and popliteal artery. He was also sent to interventional radiology for venous reflux studies. He was not felt to have significant reflux but certainly has chronic venous changes of his skin with hemosiderin deposition around this area. He will definitely need to lubricate his skin and wear compression stocking and I've talked to him about this. READMISSION 05/26/2018 This is a now 71 year old man we cared for with traumatic wounds on his left anterior lower extremity. He had been previously revascularized during that admission by Dr. [name]. Apparently in follow-up Dr. [name] noted that he had deterioration in his arterial status. He underwent a stent placement in the distal left SFA on 04/22/2018. 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 He went back to the angiography suite on  04/30/2018 he underwent PTA and balloon angioplasty of the occluded left mid anterior tibial artery, thrombotic occlusion went from 100 to 0% which reconstitutes the posterior tibial artery. He had thrombectomy and aspiration of the peroneal artery. The stent placed in the distal SFA left SFA was still occluded. 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 He has been recuperating at [facility] in [facility] after the most recent hospitalization. He is going home tomorrow. He tells me that 2 weeks ago he traumatized the tip of his left fifth toe. He came in urgently for our review of this. This was a history of before I noted that Dr. [name] had already noted dry gangrenous changes of the left fifth toe 06/09/2018; 2-week follow-up. I did contact Dr. [name] after his last appointment and he apparently saw 1 of Dr. Irven Shelling colleagues the next day. He does not follow-up with Dr. [name] himself until Thursday of this week. 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 Nevertheless there is no evidence of infection no drainage and no pain. He had a new area that this week when we were signing him in today on the left anterior mid tibia area, this is in close proximity to the previous wound we have dealt with in this clinic. 06/23/2018; 2-week follow-up. I did not receive a recent note from Dr. [name] to review today. Our office is trying to obtain this. He is apparently not planning to do further vascular interventions and wondered about compression to try and help with the patient's chronic venous insufficiency. However we are also concerned about the arterial flow. He arrives in clinic today with a new area on the left third toe. The  areas on the calf/anterior tibia are close to closing.  The left fifth toe is still mummified using Betadine. -In reviewing things with the patient he has what sounds like claudication with mild to moderate amount of activity. 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 I prescribed Levaquin over the phone while we attempted to arrange a plan of care. However the patient called yesterday to report he had low-grade fever and he came in today acutely. There is been a marked deterioration in the left third toe with spreading cellulitis up into the dorsal left foot. He was referred to the emergency room. Readmission: 06/29/2020 patient presents today for reevaluation here in our clinic he was previously treated by Dr. Dellia Nims at the latter part of 2019 in 2 the beginning of 2020. Subsequently we have not seen him since that time in the interim he did have evaluation with vein and vascular specialist specifically Dr. Anice Paganini who did perform quite extensive work for a left femoral to anterior tibial artery bypass. With that being said in the interim the patient has developed significant lymphedema and has wounds that he tells me have really never healed in regard to the incision site on the left leg. He also has multiple wounds on the feet for various reasons some of which is that he tends to pick at his feet. Fortunately there is no signs of active infection systemically at this time he does have some wounds that are little bit deeper but most are fairly superficial he seems to have good blood flow and overall everything appears to be healthy I see no bone exposed and no obvious signs of osteomyelitis. I do not know that he necessarily needs a x-ray at this point although that something we could consider depending on how things progress. The patient does have a history of lymphedema, diabetes, this is type II, chronic kidney disease stage III, hypertension, and history of  peripheral vascular disease. 07/05/2020; patient admitted last week. Is a patient I remember from 2019 he had a spreading infection involving the left foot and we sent him to the hospital. 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 He subsequently had a left femoral to anterior tibial bypass by Dr.Cain vein and vascular. He also has severe lymphedema with chronic skin changes related to that on the left leg. The most problematic area that was new today was on the left medial great toe. This was apparently a small area last week there was purulent drainage which our intake nurse cultured. Also areas on the left medial foot and heel left lateral foot. He has 2 areas on the left medial calf left lateral calf in the setting of the severe lymphedema. 07/13/2020 on evaluation today patient appears to be doing better in my opinion compared to his last visit. The good news is there is no signs of active infection systemically and locally I do not see any signs of infection either. He did have an x-ray which was negative that is great news he had a culture which showed MRSA but at the same time he is been on the doxycycline which has helped. I do think we may want to extend this for 7 additional days 1/25; patient admitted to the clinic a few weeks ago. 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 We have been putting him under compression his edema control is a lot better but he is severe verricused skin on the left  leg. He is really done quite well he still has an open area on the left medial calf and the left medial first metatarsal head. We have been using silver collagen on the leg silver alginate on the foot 07/27/2020 upon evaluation today patient appears to be doing decently well in regard to his wounds. He still has a lot of dry skin on the left leg. Some of this is starting to peel back and I think he may be able to have  them out by removing some that today. Fortunately there is no signs of active infection at this time on the left leg although on the right leg he does appear to have swelling and erythema as well as some mild warmth to touch. This does have been concerned about the possibility of cellulitis although within the differential diagnosis I do think that potentially a DVT has to be at least considered. We need to rule that out before proceeding would just call in the cellulitis. Especially since he is having pain in the posterior aspect of his calf muscle. 2/8; the patient had seen sparingly. He has severe skin changes of chronic lymphedema in the left leg thickened hyperkeratotic verrucous skin. He has an open wound on the medial part of the left first met head left mid tibia. He also has a rim of nonepithelialized skin in the anterior mid tibia. He brought in the AmLactin lotion that was been prescribed although I am not sure under compression and its utility. There concern about cellulitis on the right lower leg the last time he was here. He was put on on antibiotics. His DVT rule out was negative. The right leg looks fine he is using his stocking on this area 08/10/2020 upon evaluation today patient appears to be doing well with regard to his leg currently. He has been tolerating the dressing changes without complication. Fortunately there is no signs of active infection which is great news. Overall very pleased with where things stand. 2/22; the patient still has an area on the medial part of the left first met his head. This looks better than when I last saw this earlier this month he has a rim of epithelialization but still some surface debris. Mostly everything on the left leg is healed. There is still a vulnerable in the left mid tibia area. 08/30/2020 upon evaluation today patient appears to be doing much better in regard to his wounds on his foot. Fortunately there does not appear to be any signs of  active infection systemically though locally we did culture this last week and it does appear that he does have MRSA currently. Nonetheless I think we will address that today I Minna send in a prescription for him in that regard. Overall though there does not appear to be any signs of significant worsening. 09/07/2020 on evaluation today patient's wounds over his left foot appear to be doing excellent. I do not see any signs of infection there is some callus buildup this can require debridement for certain but overall I feel like he is managing quite nicely. He still using the AmLactin cream which has been beneficial for him as well. 3/22; left foot wound is closed. There is no open area here. He is using ammonium lactate lotion to the lower extremities to help exfoliate dry cracked skin. He has compression stockings from elastic therapy in [place]. The wound on the medial part of his left first met head is healed today. READMISSION 04/12/2021 Mr. Ellsworth is a  patient we know fairly well he had a prolonged stay in clinic in 2019 with wounds on his left lateral and left anterior lower extremity in the setting of chronic venous insufficiency. More recently he was here earlier this year with predominantly an area on his left foot first metatarsal head plantar and he says the plantar foot broke down on its not long after we discharged him but he did not come back here. The last few months areas of broken down on his left anterior and again the left lateral lower extremity. The leg itself is very swollen chronically enlarged a lot of hyperkeratotic dry Berry Q skin in the left lower leg. His edema extends well into the thigh. He was seen by Dr. Donzetta Matters. He had ABIs on 03/02/2021 showing an ABI on the right of 1 with a TBI of 0.72 his ABI in the left at 1.09 TBI of 0.99. Monophasic and biphasic waveforms on the right. On the left monophasic waveforms were noted he went on to have an angiogram on 03/27/2021 this  showed the aortic aortic and iliac segments were free of flow-limiting stenosis the left common femoral vein to evaluate the left femoral to anterior tibial artery bypass was unobstructed the bypass was patent without any areas of stenosis. We discharged the patient in bilateral juxta lite stockings but very clearly that was not sufficient to control the swelling and maintain skin integrity. He is clearly going to need compression pumps. 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 This is fortunate because he is on his feet for long periods of time. 10/27; patient comes in with our intake nurse reporting copious amount of green drainage from the left anterior mid tibia the left dorsal foot and to a lesser extent the left medial mid tibia. We left the compression wrap on all week for the amount of edema in his left leg is quite a bit better. We use silver alginate as the primary dressing 11/3; edema control is good. Left anterior lower leg left medial lower leg and the plantar first metatarsal head. The left anterior lower leg required debridement. Deep tissue culture I did of this wound showed MRSA I put him on 10 days of doxycycline which she will start today. We have him in compression wraps. He has a security card and [employer] however he is retiring on November 15. We will need to then get him into a better offloading boot for the left foot perhaps a total contact cast 11/10; edema control is quite good. Left anterior and left medial lower leg wounds in the setting of chronic venous insufficiency and lymphedema. He also has a substantial area over the left plantar first metatarsal head. 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 He has significant hypergranulation on the left plantar foot wound. The patient is a diabetic but he does not have significant  PAD 11/17; edema control is quite good. Left anterior and left medial lower leg wounds look better. The really concerning area remains the area on the left plantar first metatarsal head. He has a rim of epithelialization. 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 Starting today with a forefoot off loader but . possibly a total  contact cast. 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 Arrives with a very significant odor from I think all of the wounds on the left leg and the left foot. Because of this I did not put a total contact cast on him today but will could still consider this. His wife was having cataract surgery which is the reason he missed the appointment 12/6. I saw this man 5 days ago with a swelling below the popliteal fossa. I thought he actually might have a Baker's cyst however the DVT rule out study that we could arrange right away was negative the technician told me this was not a ruptured Baker's cyst. We attempted to get this aspirated by under ultrasound guidance in interventional radiology however all they did was an ultrasound however it shows an extensive fluid collection 62 x 8 x 9.4 in the left thigh and left calf. The patient states he thinks this started 8 days ago or so but he really is not complaining of any pain, fever or systemic symptoms. He has not ha 12/20; after some difficulty I managed to get the patient into see Dr. Donzetta Matters. Eventually he was taken into the hospital and had a drain put in the fluid collection below his left knee posteriorly extending into the posterior thigh. He still has the drain in place. Culture of this showed moderate staff aureus few Morganella and few Klebsiella he is now on doxycycline and ciprofloxacin as suggested by  infectious disease he is on this for a month. The drain will remain in place until it stops draining 12/29; he comes in today with the 1 wound on his left leg and the area on the left plantar first met head significantly smaller. Both look healthy. He still has the drain in the left leg. He says he has to change this daily. Follows up with Dr. Donzetta Matters on January 11. 06/29/2021; the wounds that I am following on the left leg and left first met head continued to be quite healthy. However the area where his inferior drain is in place had copious amounts of drainage which was green in color. The wound here is larger. Follows up with Dr. Gwenlyn Saran of vein and vascular his surgeon next week as well as infectious disease. He remains on ciprofloxacin and doxycycline. He is not complaining of excessive pain in either one of the drain areas 1/12; the patient saw vascular surgery and infectious disease. Vascular surgery has left the drain in place as there was still some notable drainage still see him back in 2 weeks. Dr. Velna Ochs stop the doxycycline and ciprofloxacin and I do not believe he follows up with them at this point. Culture I did last week showed both doxycycline resistant MRSA and Pseudomonas not sensitive to ciprofloxacin although only in rare titers 1/19; the patient's wound on the left anterior lower leg is just about healed. We have continued healing of the area that was medially on the left leg. Left first plantar metatarsal head continues to get smaller. The major problem here is his 2 drain sites 1 on the left upper calf and lateral thigh. There is purulent drainage still from the left lateral thigh. I gave him antibiotics last week but we still have recultured. He has the drain in the area I think this is eventually going to have to come out. I suspect  there will be a connecting wound to heal here perhaps with improved VAc 1/26; the patient had his drain removed by vein and vascular on 1/25/. This was a  large pocket of fluid in his left thigh that seem to tunnel into his left upper calf. 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 His mention his Penrose drain was removed today. He now has a tunneling wound on his left calf and left thigh. Both of these probe widely towards each other although I cannot really prove that they connect. Both wounds on his lower leg anteriorly are closed and his area over the first metatarsal head on his right foot continues to improve. We are using Hydrofera Blue here. He also saw infectious disease culture of the abscess they noted was polymicrobial with MRSA, Morganella and Klebsiella he was treated with doxycycline and ciprofloxacin for 4 weeks ending on 07/03/2021. They did not recommend any further antibiotics. Notable that while he still had the Penrose drain in place last week he had purulent drainage coming out of the inferior IandD site this grew [facility], MRSA and Pseudomonas but there does not appear to be any active infection in this area today with the drain out and he is not systemically unwell 2/2; with regards to the drain sites the superior one on the thigh actually is closed down the one on the upper left lateral calf measures about 8 and half centimeters which is an improvement seems to be less prominent although still with a lot of drainage. The only remaining wound is over the first metatarsal head on the left foot and this looks to be continuing to improve with Hydrofera Blue. 2/9; the area on his plantar left foot continues to contract. Callus around the wound edge. The drain sites specifically have not come down in depth. We put the wound VAC on Monday he changed the canister late last night our intake nurse reported a pocket of fluid perhaps caused by our compression wraps 2/16; continued improvement in left foot plantar wound. drainage site in the calf is not improved in terms of depth (wound vac) 2/23; continued  improvement in the left foot wound over the first metatarsal head. With regards to the drain sites the area on his thigh laterally is healed however the open area on his calf is small in terms of circumference by still probes in by about 15 cm. Within using the wound VAC. Hydrofera Blue on his foot 08/24/2021: The left first metatarsal head wound continues to improve. The wound bed is healthy with just some surrounding callus. Unfortunately the open drain site on his calf remains open and tunnels at least 15 cm (the extent of a Q-tip). This is despite several weeks of wound VAC treatment. Based on reading back through the notes, there has been really no significant change in the depth of the wound, although the orifice is smaller and the more cranial wound on his thigh has closed. I suspect the tunnel tracks nearly all the way to this location. 08/31/2021: Continued improvement in the left first metatarsal head wound. There has been absolutely no improvement to the long tunnel from his open drain site on his calf. We have tried to get him into see vascular surgery sooner to consider the possibility of simply filleting the tract open and allowing it to heal from the bottom up, likely with a wound VAC. They have not yet scheduled a sooner appointment than his current mid April 09/14/2021: He  was seen by vascular surgery and they took him to the operating room last week. They opened a portion of the tunnel, but did not extend the entire length of the known open subcutaneous tract. I read Dr. Claretha Cooper operative note and it is not clear from that documentation why only a portion of the tract was opened. The heaped up granulation tissue was curetted and removed from at least some portion of the tract. They did place a wound VAC and applied an Unna boot to the leg. The ulcer on his left first metatarsal head is smaller today. The bed looks good and there is just a small amount of surrounding callus. 09/21/2021: The  ulcer on his left first metatarsal head looks to be stalled. There is some callus surrounding the wound but the wound bed itself does not appear particularly dynamic. The tunnel tract on his lateral left leg seems to be roughly the same length or perhaps slightly smaller but the wound bed appears healthy with good granulation tissue. He opened up a new wound on his medial thigh and the site of a prior surgical incision. He says that he did this unconsciously in his sleep by scratching. 09/28/2021: Unfortunately, the ulcer on his left first metatarsal head has extended underneath the callus toward the dorsum of his foot. The medial thigh wounds are roughly the same. The tunnel on his lateral left leg continues to be problematic; it is longer than we are able to actually probe with a Q-tip. I am still not certain as to why Dr. Donzetta Matters did not open this up entirely when he took the patient to the operating room. We will likely be back in the same situation with just a small superficial opening in a long unhealed tract, as the open portion is granulating in nicely. 10/02/2021: The patient was initially scheduled for a nurse visit, but we are also applying a total contact cast today. The plantar foot wound looks clean without significant accumulated callus. We have been applying Prisma silver collagen to the site. 10/05/2021: The patient is here for his first total contact cast change. We have tried using gauze packing strips in the tunnel on his lateral leg wound, but this does not seem to be working any better than the white VAC foam. The foot ulcer looks about the same with minimal periwound callus. Medial thigh wound is clean with just some overlying eschar. 10/12/2021: The plantar foot wound is stable without any significant accumulation of periwound callus. The surface is viable with good granulation tissue. The medial thigh wounds are much smaller and are epithelializing. On the other hand, he had purulent  drainage coming from the tunnel on his lateral leg. He does go back to see Dr. Donzetta Matters next week and is planning to ask him why the wound tunnel was not completely opened at the time of his most recent operation. 10/19/2021: The plantar foot wound is markedly improved and has epithelial tissue coming through the surface. The medial thigh wounds are nearly closed with just a tiny open area. He did see Dr. Donzetta Matters earlier this week and apparently they did discuss the possibility of opening the sinus tract further and enabling a wound VAC application. Apparently there are some limits as to what Dr. 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 I think if we could get the tract open to the level of the popliteal fossa, this would greatly aid in her ability to get this chart closed. That being  said, however, today when I probed the tract with a Q-tip, I was not able to insert the entirety of the Q-tip as I have on previous occasions. The tunnel is shorter by about 4 cm. The surface is clean with good granulation tissue and no further episodes of purulent drainage. 10/30/2021: Last week, the patient underwent surgery and had the long tract in his leg opened. There was a rind that was debrided, according to the operative report. His medial thigh ulcers are closed. The plantar foot wound is clean with a good surface and some built up surrounding callus. 11/06/2021: The overall dimensions of the large wound on his lateral leg remain about the same, but there is good granulation tissue present and the tunneling is a little bit shorter. He has a new wound on his anterior tibial surface, in the same location where he had a similar lesion in the past. The plantar foot wound is clean with some buildup surrounding callus. Just toward the medial aspect of his foot, however, there is an area of darkening that once debrided, revealed another opening in the skin surface. 11/13/2021: The anterior  tibial surface wound is closed. The plantar foot wound has some surrounding callus buildup. The area of darkening that I debrided last week and revealed an opening in the skin surface has closed again. The tunnel in the large wound on his lateral leg has come in by about 3 cm. There is healthy granulation tissue on the entire wound surface. 11/23/2021: The patient was out of town last week and did wet-to-dry dressings on his large wound. 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 Unfortunately, he picked open the wound on his left medial thigh. He says that it was itching and he just could not stop scratching it until it was open again. The wound on his plantar foot is smaller and has not accumulated a tremendous amount of callus. The lateral leg wound is shallower and the tunnel has also decreased in depth. There is just a little bit of slough accumulation on the surface. 11/30/2021: Another portion of his left medial thigh has been opened up. All of these wounds are fairly superficial with just a little bit of slough and eschar accumulation. The wound on his plantar foot is almost closed with just a bit of eschar and periwound callus accumulation. The lateral leg wound is nearly flush with the surrounding skin and the tunnel is markedly shallower. 12/07/2021: There is just 1 open area on his left medial thigh. It is clean with just a little bit of perimeter eschar. The wound on his plantar foot continues to contract and just has some eschar and periwound callus accumulation. The lateral leg wound is closing at the more distal aspect and the tunnel is smaller. The surface is nearly flush with the surrounding skin and it has a good bed of granulation tissue. 12/14/2021: The thigh and foot wounds are closed. The lateral leg wound has closed over approximately half of its length. The tunnel continues to contract and the surface is now flush  with the surrounding skin. The wound bed has robust granulation tissue. 12/22/2021: The thigh and foot wounds have reopened. The foot wound has a lot of callus accumulation around and over it. The thigh wound is tiny with just a little bit of slough in the wound bed. The lateral leg wound continues to contract. His vascular surgeon took the wound VAC off  earlier in the week and the patient has been doing wet-to-dry dressings. There is a little slough accumulation on the surface. The tunnel is about 3 cm in depth at this point. 12/28/2021: The thigh wound is closed again. The foot wound has some callus that subsequently has peeled back exposing just a small slit of a wound. The lateral leg wound Is down to about half the size that it originally was and the tunnel is down to about half a centimeter in depth. 01/04/2022: The thigh wound remains closed. The foot wound has heavy callus overlying the wound site. Once this was debrided, the wound was found to be closed. The lateral leg wound is smaller again this week and very superficial. No tunnel could be identified. 01/12/2022: The thigh and foot wounds both remain closed. The lateral leg wound is now nearly flush with the skin surface. There is good granulation tissue present with a light layer of slough. 01/19/2022: Due to the way his wrap was placed, the patient did not change the dressing on his thigh at all and so the foam was saturated and his skin is macerated. There is a light layer of slough on the wound surface. The underlying granulation tissue is robust and healthy-appearing. He has heavy callus buildup at the site of his first metatarsal head wound which is still healed. 02/01/2022: He has been in silver alginate. When he removed the dressing from his thigh wound, however, some leg, superficially reopening a portion of the wound that had healed. In addition, underneath the callus at his left first metatarsal head, there appears to be a blister and  the wound appears to be open again. 02/08/2022: The lateral leg wound has contracted substantially. There is eschar and a light layer of slough present. He says that it is starting to pull and is uncomfortable. On inspection, there is some puckering of the scar and the eschar is quite dry; this may account for his symptoms. On his first metatarsal head, the wound is much smaller with just some eschar on the surface. The callus has not reaccumulated. He reports that he had a blister come up on his medial thigh wound at the distal aspect. It popped and there is now an opening in his skin again. Looking back through his [place] of wound photos, there is what looks like a permanent suture just deep to this location and it may be trying to erode through. We have been using silver alginate on his wounds. 02/15/2022: The lateral leg wound is about half the size it was last week. It is clean with just a little perimeter eschar and light slough. The wound on his first metatarsal head is about the same with heavy callus overlying it. The medial thigh wound is closed again. He does have some skin changes on the top of his foot that looks potentially yeast related. Electronic Signature(s) Signed: 02/15/2022 9:23:15 AM By: Fredirick Maudlin MD FACS Entered By: Fredirick Maudlin on 02/15/2022 09:23:15 -------------------------------------------------------------------------------- Physical Exam Details Patient Name: Date of Service: [name]. 02/15/2022 8:15 A M Medical Record Number: [id number] Patient Account Number: [id number] Date of Birth/Sex: Treating RN: 1951/04/09 (71 y.o. M) Primary Care Provider: [name] Other Clinician: Referring Provider: Treating Provider/Extender: [name] in Treatment: 2 Constitutional Slightly hypertensive. Slightly bradycardic, asymptomatic.. . . No acute distress.Marland Kitchen Respiratory Normal work of breathing on room air.. Notes 02/15/2022: The  lateral leg wound is about half the size it was last week. It is  clean with just a little perimeter eschar and light slough. The wound on his first metatarsal head is about the same with heavy callus overlying it. The medial thigh wound is closed again. He does have some skin changes on the top of his foot that looks potentially yeast related. Electronic Signature(s) Signed: 02/15/2022 9:23:48 AM By: Fredirick Maudlin MD FACS Entered By: Fredirick Maudlin on 02/15/2022 09:23:47 -------------------------------------------------------------------------------- Physician Orders Details Patient Name: Date of Service: [name]. 02/15/2022 8:15 A M Medical Record Number: [id number] Patient Account Number: [id number] Date of Birth/Sex: Treating RN: [birth year] (71 y.o. [name] Primary Care Provider: [name] Other Clinician: Referring Provider: Treating Provider/Extender: [name] in Treatment: 64 Verbal / Phone Orders: No Diagnosis Coding ICD-10 Coding Code Description 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 I89.0 Lymphedema, not elsewhere classified I87.322 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verity 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 - Dr Celine Ahr - Room 1 Return A Thursday 8/31 @ 10:30 am Anesthetic Wound #22 Left,Lateral Lower Leg (In clinic) Topical Lidocaine 4% applied to wound bed Wound #27 Left,Medial Foot (In clinic) Topical Lidocaine 4% applied to wound bed Bathing/ Shower/ Hygiene May shower with protection but do not get wound dressing(s) wet. - Use a cast protector so you can  shower without getting your wrap(s) wet Edema Control - Lymphedema / SCD / Other Elevate legs to the level of the heart or above for 30 minutes daily and/or when sitting, a frequency of: - throughout the day Avoid standing for long periods of time. Patient to wear own compression stockings every day. - on right leg; Moisturize legs daily. - Ammonium lactate to right leg daily Wound Treatment Wound #22 - Lower Leg Wound Laterality: Left, Lateral Cleanser: Soap and Water 3 x Per Week/30 Days Discharge Instructions: May shower and wash wound with dial antibacterial soap and water prior to dressing change. Cleanser: Wound Cleanser 3 x Per Week/30 Days Discharge Instructions: Cleanse the wound with wound cleanser prior to applying a clean dressing using gauze sponges, not tissue or cotton balls. Peri-Wound Care: Triamcinolone 15 (g) 3 x Per Week/30 Days Discharge Instructions: Use triamcinolone 15 (g) as directed Peri-Wound Care: Sween Lotion (Moisturizing lotion) 3 x Per Week/30 Days Discharge Instructions: Apply moisturizing lotion to the leg Prim Dressing: KerraCel Ag Gelling Fiber Dressing, 4x5 in (silver alginate) 3 x Per Week/30 Days ary Discharge Instructions: Apply silver alginate to wound bed as instructed Secondary Dressing: ABD Pad, 8x10 (Generic) 3 x Per Week/30 Days Discharge Instructions: Apply over primary dressing as directed. Secured With: Elastic Bandage 4 inch (ACE bandage) 3 x Per Week/30 Days Discharge Instructions: Secure with ACE bandage as directed. Compression Wrap: CoFlex TLC XL 2-layer Compression System 4x7 (in/yd) 3 x Per Week/30 Days Discharge Instructions: Apply CoFlex 2-layer compression as directed. (alt for 4 layer) Wound #27 - Foot Wound Laterality: Left, Medial Peri-Wound Care: Sween Lotion (Moisturizing lotion) 1 x Per Week Discharge Instructions: Apply moisturizing lotion as directed Topical: Ketoconazole Cream 2% 1 x Per Week Discharge Instructions:  Apply Ketoconazole to top of foot Prim Dressing: KerraCel Ag Gelling Fiber Dressing, 2x2 in (silver alginate) 1 x Per Week ary Discharge Instructions: Apply silver alginate to  wound bed as instructed Secondary Dressing: Optifoam Non-Adhesive Dressing, 4x4 in 1 x Per Week Discharge Instructions: Apply over primary dressing cut to make foam donut Secondary Dressing: Woven Gauze Sponges 2x2 in 1 x Per Week Discharge Instructions: Apply over primary dressing as directed. Secured With: 63M Medipore H Soft Cloth Surgical T ape, 4 x 10 (in/yd) 1 x Per Week Discharge Instructions: Secure with tape as directed. Compression Wrap: CoFlex TLC XL 2-layer Compression System 4x7 (in/yd) 1 x Per Week Discharge Instructions: Apply CoFlex 2-layer compression as directed. (alt for 4 layer) Patient Medications llergies: No Known Drug Allergies A Notifications Medication Indication Start End prior to debridement 02/15/2022 lidocaine DOSE topical 4 % cream - cream topical Electronic Signature(s) Signed: 02/15/2022 10:03:26 AM By: Fredirick Maudlin MD FACS Entered By: Fredirick Maudlin on 02/15/2022 09:24:08 -------------------------------------------------------------------------------- Problem List Details Patient Name: Date of Service: [name]. 02/15/2022 8:15 A M Medical Record Number: [id number] Patient Account Number: [id number] Date of Birth/Sex: Treating RN: Jun 02, 1951 (71 y.o. M) Primary Care Provider: [name] Other Clinician: Referring Provider: Treating Provider/Extender: [name] in Treatment: 81 Active Problems ICD-10 Encounter Code Description Active Date MDM Diagnosis 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 04/12/2021 No Yes I89.0 Lymphedema, not elsewhere classified 04/12/2021 No Yes I87.322 Chronic venous hypertension (idiopathic) with inflammation of left lower 04/12/2021 No Yes extremity L97.828  Non-pressure chronic ulcer of other part of left lower leg with other specified 04/12/2021 No Yes severity 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 of other part of left foot with other specified 04/12/2021 No Yes severity 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 07/20/2021 No Yes Inactive Problems ICD-10 Code Description Active Date Inactive Date E11.621 Type 2 diabetes mellitus with foot ulcer 04/12/2021 04/12/2021 E11.42 Type 2 diabetes mellitus with diabetic polyneuropathy 04/12/2021 04/12/2021 L02.416 Cutaneous abscess of left lower limb 06/13/2021 06/13/2021 Resolved Problems Electronic Signature(s) Signed: 02/15/2022 9:19:55 AM By: Fredirick Maudlin MD FACS Entered By: Fredirick Maudlin on 02/15/2022 09:19:55 -------------------------------------------------------------------------------- Progress Note Details Patient Name: Date of Service: [name]. 02/15/2022 8:15 A M Medical Record Number: [id number] Patient Account Number: [id number] Date of Birth/Sex: Treating RN: 02/26/1951 (71 y.o. M) Primary Care Provider: [name] Other Clinician: Referring Provider: Treating Provider/Extender: [name] in Treatment: 44 Subjective Chief Complaint Information obtained from Patient Left leg and foot ulcers 04/12/2021; patient is here for wounds on his left lower leg and left plantar foot over the first metatarsal head History of Present Illness (HPI) 10/11/17; Mr. Awbrey is a 71 year old man who tells me that in 2015 he slipped down the latter traumatizing his left leg. He developed a wound in the same spot the area that we are currently looking at. He states this closed over for the most part although he always felt it was somewhat unstable. In 2016 he hit the same area with the door of his car had this reopened. He tells me that this is never really closed although sometimes an inflow it remains open on a constant basis.  He has not been using any specific dressing to this except for topical antibiotics the nature of which were not really sure. His primary doctor did send him to see Dr. [name] of interventional cardiology. He underwent an angiogram on 08/06/17 and he underwent a PTA and directional atherectomy of the lesser distal SFA and popliteal arteries which resulted in brisk improvement in blood flow. It was noted that he had  2 vessel runoff through the anterior tibial and peroneal. He is also been to see vascular and interventional radiologist. He was not felt to have any significant superficial venous insufficiency. Presumably is not a candidate for any ablation. It was suggested he come here for wound care. The patient is a type II diabetic on insulin. He also has a history of venous insufficiency. ABIs on the left were noncompressible in our clinic 10/21/17; patient we admitted to the clinic last week. 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 We put Iodosorb on him after an aggressive debridement and 3 layer compression. He complained of pain in his ankle and itching with is skin in fact he scratched the area on the medial calf superiorly at the rim of our wraps and he has 2 small open areas in that location today which are new. I changed his primary dressing today to silver collagen. As noted he is already had revascularization and does not have any significant superficial venous insufficiency that would be amenable to ablation 10/28/17; patient admitted to the clinic 2 weeks ago. He has a smaller Wound. Scratch injury from last week revealed. There is large wound over the tibial area. This is smaller. Granulation looks healthy. No need for debridement. 11/04/17; the wound on the left lateral calf looks better. Improved dimensions. Surface of this looks better. We've been maintaining him and Kerlix Coban wraps. He finds this much more comfortable.  Silver collagen dressing 11/11/17; left lateral Wound continues to look healthy be making progress. Using a #5 curet I removed removed nonviable skin from the surface of the wound and then necrotic debris from the wound surface. Surface of the wound continues to look healthy. ooHe also has an open area on the left great toenail bed. We've been using topical antibiotics. 11/19/17; left anterior lateral wound continues to look healthy but it's not closed. ooHe also had a small wound above this on the left leg ooInitially traumatic wounds in the setting of significant chronic venous insufficiency and stasis dermatitis 11/25/17; left anterior wounds superiorly is closed still a small wound inferiorly. 12/02/17; left anterior tibial area. Arrives today with adherent callus. Post debridement clearly not completely closed. Hydrofera Blue under 3 layer compression. 12/09/17; left anterior tibia. Circumferential eschar however the wound bed looks stable to improved. We've been using Hydrofera Blue under 3 layer compression 12/17/17; left anterior tibia. Apparently this was felt to be closed however when the wrap was taken off there is a skin tear to reopen wounds in the same area we've been using Hydrofera Blue under 3 layer compression 12/23/17 left anterior tibia. Not close to close this week apparently the [facility] was stuck to this again. Still circumferential eschar requiring debridement. I put a contact layer on this this time under the Hydrofera Blue 12/31/17; left anterior tibia. Wound is better slight amount of hyper-granulation. Using Hydrofera Blue over Adaptic. 01/07/18; left anterior tibia. The wound had some surface eschar however after this was removed he has no open wound.he was already revascularized by Dr. [name] when he came to our clinic with atherectomy of the left SFA and popliteal artery. He was also sent to interventional radiology for venous reflux studies. He was not felt to have  significant reflux but certainly has chronic venous changes of his skin with hemosiderin deposition around this area. He will definitely need to lubricate his skin and wear compression stocking and I've talked to  him about this. READMISSION 05/26/2018 This is a now 71 year old man we cared for with traumatic wounds on his left anterior lower extremity. He had been previously revascularized during that admission by Dr. [name]. Apparently in follow-up Dr. [name] noted that he had deterioration in his arterial status. He underwent a stent placement in the distal left SFA on 04/22/2018. 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 He went back to the angiography suite on 04/30/2018 he underwent PTA and balloon angioplasty of the occluded left mid anterior tibial artery, thrombotic occlusion went from 100 to 0% which reconstitutes the posterior tibial artery. He had thrombectomy and aspiration of the peroneal artery. The stent placed in the distal SFA left SFA was still occluded. 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 He has been recuperating at [facility] after the most recent hospitalization. He is going home tomorrow. He tells me that 2 weeks ago he traumatized the tip of his left fifth toe. He came in urgently for our review of this. This was a history of before I noted that Dr. [name] had already noted dry gangrenous changes of the left fifth toe 06/09/2018; 2-week follow-up. I did contact Dr. [name] after his last appointment and he apparently saw 1 of Dr. Irven Shelling colleagues the next day. He does not follow-up with Dr. [name] himself until Thursday of this week. He has dry gangrene on the tip of most of his left fifth  toe. Nevertheless there is no evidence of infection no drainage and no pain. He had a new area that this week when we were signing him in today on the left anterior mid tibia area, this is in close proximity to the previous wound we have dealt with in this clinic. 06/23/2018; 2-week follow-up. I did not receive a recent note from Dr. [name] to review today. Our office is trying to obtain this. He is apparently not planning to do further vascular interventions and wondered about compression to try and help with the patient's chronic venous insufficiency. However we are also concerned about the arterial flow. ooHe arrives in clinic today with a new area on the left third toe. The areas on the calf/anterior tibia are close to closing. The left fifth toe is still mummified using Betadine. -In reviewing things with the patient he has what sounds like claudication with mild to moderate amount of activity. 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 I prescribed Levaquin over the phone while we attempted to arrange a plan of care. However the patient called yesterday to report he had low-grade fever and he came in today acutely. There is been a marked deterioration in the left third toe with spreading cellulitis up into the dorsal left foot. He was referred to the emergency room. Readmission: 06/29/2020 patient presents today for reevaluation here in our clinic he was previously treated by Dr. Dellia Nims at the latter part of 2019 in 2 the beginning of 2020. Subsequently we have not seen him since that time in the interim he did have evaluation with vein and vascular specialist specifically Dr. Anice Paganini who did perform quite extensive work for a left femoral to anterior tibial artery bypass. With that being said in  the interim the patient has developed significant lymphedema and has wounds that he tells me have really never healed in regard to the incision site on the left leg.  He also has multiple wounds on the feet for various reasons some of which is that he tends to pick at his feet. Fortunately there is no signs of active infection systemically at this time he does have some wounds that are little bit deeper but most are fairly superficial he seems to have good blood flow and overall everything appears to be healthy I see no bone exposed and no obvious signs of osteomyelitis. I do not know that he necessarily needs a x-ray at this point although that something we could consider depending on how things progress. The patient does have a history of lymphedema, diabetes, this is type II, chronic kidney disease stage III, hypertension, and history of peripheral vascular disease. 07/05/2020; patient admitted last week. Is a patient I remember from 2019 he had a spreading infection involving the left foot and we sent him to the hospital. 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 He subsequently had a left femoral to anterior tibial bypass by Dr.Cain vein and vascular. He also has severe lymphedema with chronic skin changes related to that on the left leg. The most problematic area that was new today was on the left medial great toe. This was apparently a small area last week there was purulent drainage which our intake nurse cultured. Also areas on the left medial foot and heel left lateral foot. He has 2 areas on the left medial calf left lateral calf in the setting of the severe lymphedema. 07/13/2020 on evaluation today patient appears to be doing better in my opinion compared to his last visit. The good news is there is no signs of active infection systemically and locally I do not see any signs of infection either. He did have an x-ray which was negative that is great news he had a culture which showed MRSA but at the same time he is been on the doxycycline which has helped. I do think we may want to extend this for 7 additional  days 1/25; patient admitted to the clinic a few weeks ago. 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 We have been putting him under compression his edema control is a lot better but he is severe verricused skin on the left leg. He is really done quite well he still has an open area on the left medial calf and the left medial first metatarsal head. We have been using silver collagen on the leg silver alginate on the foot 07/27/2020 upon evaluation today patient appears to be doing decently well in regard to his wounds. He still has a lot of dry skin on the left leg. Some of this is starting to peel back and I think he may be able to have them out by removing some that today. Fortunately there is no signs of active infection at this time on the left leg although on the right leg he does appear to have swelling and erythema as well as some mild warmth to touch. This does have been concerned about the possibility of cellulitis although within the differential diagnosis I do think that potentially a DVT has to be at least considered. We need to rule that out before proceeding would just call in the cellulitis. Especially since he is  having pain in the posterior aspect of his calf muscle. 2/8; the patient had seen sparingly. He has severe skin changes of chronic lymphedema in the left leg thickened hyperkeratotic verrucous skin. He has an open wound on the medial part of the left first met head left mid tibia. He also has a rim of nonepithelialized skin in the anterior mid tibia. He brought in the AmLactin lotion that was been prescribed although I am not sure under compression and its utility. There concern about cellulitis on the right lower leg the last time he was here. He was put on on antibiotics. His DVT rule out was negative. The right leg looks fine he is using his stocking on this area 08/10/2020 upon evaluation today patient appears to be doing well with  regard to his leg currently. He has been tolerating the dressing changes without complication. Fortunately there is no signs of active infection which is great news. Overall very pleased with where things stand. 2/22; the patient still has an area on the medial part of the left first met his head. This looks better than when I last saw this earlier this month he has a rim of epithelialization but still some surface debris. Mostly everything on the left leg is healed. There is still a vulnerable in the left mid tibia area. 08/30/2020 upon evaluation today patient appears to be doing much better in regard to his wounds on his foot. 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 Nonetheless I think we will address that today I Minna send in a prescription for him in that regard. Overall though there does not appear to be any signs of significant worsening. 09/07/2020 on evaluation today patient's wounds over his left foot appear to be doing excellent. I do not see any signs of infection there is some callus buildup this can require debridement for certain but overall I feel like he is managing quite nicely. He still using the AmLactin cream which has been beneficial for him as well. 3/22; left foot wound is closed. There is no open area here. He is using ammonium lactate lotion to the lower extremities to help exfoliate dry cracked skin. He has compression stockings from elastic therapy in [place]. The wound on the medial part of his left first met head is healed today. READMISSION 04/12/2021 Mr. Boudreau is a patient we know fairly well he had a prolonged stay in clinic in 2019 with wounds on his left lateral and left anterior lower extremity in the setting of chronic venous insufficiency. More recently he was here earlier this year with predominantly an area on his left foot  first metatarsal head plantar and he says the plantar foot broke down on its not long after we discharged him but he did not come back here. The last few months areas of broken down on his left anterior and again the left lateral lower extremity. The leg itself is very swollen chronically enlarged a lot of hyperkeratotic dry Berry Q skin in the left lower leg. His edema extends well into the thigh. He was seen by Dr. Donzetta Matters. He had ABIs on 03/02/2021 showing an ABI on the right of 1 with a TBI of 0.72 his ABI in the left at 1.09 TBI of 0.99. Monophasic and biphasic waveforms on the right. On the left monophasic waveforms were noted he went on  to have an angiogram on 03/27/2021 this showed the aortic aortic and iliac segments were free of flow-limiting stenosis the left common femoral vein to evaluate the left femoral to anterior tibial artery bypass was unobstructed the bypass was patent without any areas of stenosis. We discharged the patient in bilateral juxta lite stockings but very clearly that was not sufficient to control the swelling and maintain skin integrity. He is clearly going to need compression pumps. 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 This is fortunate because he is on his feet for long periods of time. 10/27; patient comes in with our intake nurse reporting copious amount of green drainage from the left anterior mid tibia the left dorsal foot and to a lesser extent the left medial mid tibia. We left the compression wrap on all week for the amount of edema in his left leg is quite a bit better. We use silver alginate as the primary dressing 11/3; edema control is good. Left anterior lower leg left medial lower leg and the plantar first metatarsal head. The left anterior lower leg required debridement. Deep tissue culture I did of this wound showed MRSA I put him on 10 days of doxycycline which she will start today. We have him in  compression wraps. He has a security card and [employer] however he is retiring on November 15. We will need to then get him into a better offloading boot for the left foot perhaps a total contact cast 11/10; edema control is quite good. Left anterior and left medial lower leg wounds in the setting of chronic venous insufficiency and lymphedema. He also has a substantial area over the left plantar first metatarsal head. 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 He has significant hypergranulation on the left plantar foot wound. The patient is a diabetic but he does not have significant PAD 11/17; edema control is quite good. Left anterior and left medial lower leg wounds look better. The really concerning area remains the area on the left plantar first metatarsal head. He has a rim of epithelialization. 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 Starting today with a forefoot off loader but . possibly a total contact cast. 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 Arrives with a very significant odor from I think all of the wounds on the left leg and the left foot. Because of this I did not put a total contact cast on him today but will could still consider this. His wife was having cataract surgery which is the reason he missed the appointment 12/6. I saw this man 5 days ago with a swelling below the popliteal fossa. I thought he actually might have a Baker's cyst however the DVT rule out study that we could arrange right away was negative the technician told me this was not a ruptured Baker's cyst. We attempted to get this aspirated by under ultrasound guidance in interventional radiology  however all they did was an ultrasound however it shows an extensive fluid collection 62 x 8 x 9.4 in the left  thigh and left calf. The patient states he thinks this started 8 days ago or so but he really is not complaining of any pain, fever or systemic symptoms. He has not ha 12/20; after some difficulty I managed to get the patient into see Dr. Donzetta Matters. Eventually he was taken into the hospital and had a drain put in the fluid collection below his left knee posteriorly extending into the posterior thigh. He still has the drain in place. Culture of this showed moderate staff aureus few Morganella and few Klebsiella he is now on doxycycline and ciprofloxacin as suggested by infectious disease he is on this for a month. The drain will remain in place until it stops draining 12/29; he comes in today with the 1 wound on his left leg and the area on the left plantar first met head significantly smaller. Both look healthy. He still has the drain in the left leg. He says he has to change this daily. Follows up with Dr. Donzetta Matters on January 11. 06/29/2021; the wounds that I am following on the left leg and left first met head continued to be quite healthy. However the area where his inferior drain is in place had copious amounts of drainage which was green in color. The wound here is larger. Follows up with Dr. Gwenlyn Saran of vein and vascular his surgeon next week as well as infectious disease. He remains on ciprofloxacin and doxycycline. He is not complaining of excessive pain in either one of the drain areas 1/12; the patient saw vascular surgery and infectious disease. Vascular surgery has left the drain in place as there was still some notable drainage still see him back in 2 weeks. Dr. Velna Ochs stop the doxycycline and ciprofloxacin and I do not believe he follows up with them at this point. Culture I did last week showed both doxycycline resistant MRSA and Pseudomonas not sensitive to ciprofloxacin although only in rare  titers 1/19; the patient's wound on the left anterior lower leg is just about healed. We have continued healing of the area that was medially on the left leg. Left first plantar metatarsal head continues to get smaller. The major problem here is his 2 drain sites 1 on the left upper calf and lateral thigh. There is purulent drainage still from the left lateral thigh. I gave him antibiotics last week but we still have recultured. He has the drain in the area I think this is eventually going to have to come out. I suspect there will be a connecting wound to heal here perhaps with improved VAc 1/26; the patient had his drain removed by vein and vascular on 1/25/. This was a large pocket of fluid in his left thigh that seem to tunnel into his left upper calf. 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 His mention his Penrose drain was removed today. He now has a tunneling wound on his left calf and left thigh. Both of these probe widely towards each other although I cannot really prove that they connect. Both wounds on his lower leg anteriorly are closed and his area over the first metatarsal head on his right foot continues to improve. We are using Hydrofera Blue here. He also saw infectious disease culture of the abscess they noted was polymicrobial with MRSA, Morganella and Klebsiella he was treated with doxycycline and ciprofloxacin for 4 weeks ending on 07/03/2021. They did not recommend any further antibiotics. Notable that while he still had the Penrose drain in place  last week he had purulent drainage coming out of the inferior IandD site this grew [facility], MRSA and Pseudomonas but there does not appear to be any active infection in this area today with the drain out and he is not systemically unwell 2/2; with regards to the drain sites the superior one on the thigh actually is closed down the one on the upper left lateral calf measures about 8 and half centimeters which is  an improvement seems to be less prominent although still with a lot of drainage. The only remaining wound is over the first metatarsal head on the left foot and this looks to be continuing to improve with Hydrofera Blue. 2/9; the area on his plantar left foot continues to contract. Callus around the wound edge. The drain sites specifically have not come down in depth. We put the wound VAC on Monday he changed the canister late last night our intake nurse reported a pocket of fluid perhaps caused by our compression wraps 2/16; continued improvement in left foot plantar wound. drainage site in the calf is not improved in terms of depth (wound vac) 2/23; continued improvement in the left foot wound over the first metatarsal head. With regards to the drain sites the area on his thigh laterally is healed however the open area on his calf is small in terms of circumference by still probes in by about 15 cm. Within using the wound VAC. Hydrofera Blue on his foot 08/24/2021: The left first metatarsal head wound continues to improve. The wound bed is healthy with just some surrounding callus. Unfortunately the open drain site on his calf remains open and tunnels at least 15 cm (the extent of a Q-tip). This is despite several weeks of wound VAC treatment. Based on reading back through the notes, there has been really no significant change in the depth of the wound, although the orifice is smaller and the more cranial wound on his thigh has closed. I suspect the tunnel tracks nearly all the way to this location. 08/31/2021: Continued improvement in the left first metatarsal head wound. There has been absolutely no improvement to the long tunnel from his open drain site on his calf. We have tried to get him into see vascular surgery sooner to consider the possibility of simply filleting the tract open and allowing it to heal from the bottom up, likely with a wound VAC. They have not yet scheduled a sooner  appointment than his current mid April 09/14/2021: He was seen by vascular surgery and they took him to the operating room last week. They opened a portion of the tunnel, but did not extend the entire length of the known open subcutaneous tract. I read Dr. Claretha Cooper operative note and it is not clear from that documentation why only a portion of the tract was opened. The heaped up granulation tissue was curetted and removed from at least some portion of the tract. They did place a wound VAC and applied an Unna boot to the leg. The ulcer on his left first metatarsal head is smaller today. The bed looks good and there is just a small amount of surrounding callus. 09/21/2021: The ulcer on his left first metatarsal head looks to be stalled. There is some callus surrounding the wound but the wound bed itself does not appear particularly dynamic. The tunnel tract on his lateral left leg seems to be roughly the same length or perhaps slightly smaller but the wound bed appears healthy with good granulation tissue.  He opened up a new wound on his medial thigh and the site of a prior surgical incision. He says that he did this unconsciously in his sleep by scratching. 09/28/2021: Unfortunately, the ulcer on his left first metatarsal head has extended underneath the callus toward the dorsum of his foot. The medial thigh wounds are roughly the same. The tunnel on his lateral left leg continues to be problematic; it is longer than we are able to actually probe with a Q-tip. I am still not certain as to why Dr. Donzetta Matters did not open this up entirely when he took the patient to the operating room. We will likely be back in the same situation with just a small superficial opening in a long unhealed tract, as the open portion is granulating in nicely. 10/02/2021: The patient was initially scheduled for a nurse visit, but we are also applying a total contact cast today. The plantar foot wound looks clean without significant  accumulated callus. We have been applying Prisma silver collagen to the site. 10/05/2021: The patient is here for his first total contact cast change. We have tried using gauze packing strips in the tunnel on his lateral leg wound, but this does not seem to be working any better than the white VAC foam. The foot ulcer looks about the same with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

## 2022-02-15 NOTE — Progress Notes (Signed)
[name], [name] ([id number]) Visit Report for 02/15/2022 Arrival Information Details Patient Name: Date of Service: [name], [name] 02/15/2022 8:15 A M Medical Record Number: [id number] Patient Account Number: [id number] Date of Birth/Sex: Treating RN: 1951-04-16 (71 y.o. [name] Primary Care Isaiha Asare: [name] Other Clinician: Referring Atasha Colebank: Treating Chapel Silverthorn/Extender: [name] in Treatment: 67 Visit Information History Since Last Visit Added or deleted any medications: No Patient Arrived: Ambulatory Any new allergies or adverse reactions: No Arrival Time: 08:18 Had a fall or experienced change in No Accompanied By: sister activities of daily living that may affect Transfer Assistance: None risk of falls: Patient Identification Verified: Yes Signs or symptoms of abuse/neglect since last visito No Secondary Verification Process Completed: Yes Hospitalized since last visit: No Patient Requires Transmission-Based Precautions: No Implantable device outside of the clinic excluding No Patient Has Alerts: Yes cellular tissue based products placed in the center since last visit: Has Dressing in Place as Prescribed: Yes Has Compression in Place as Prescribed: Yes Pain Present Now: Yes Electronic Signature(s) Signed: 02/15/2022 5:23:24 PM By: Baruch Gouty RN, BSN Entered By: Baruch Gouty on 02/15/2022 08:22:26 -------------------------------------------------------------------------------- Compression Therapy Details Patient Name: Date of Service: [name]. 02/15/2022 8:15 A M Medical Record Number: [id number] Patient Account Number: [id number] Date of Birth/Sex: Treating RN: 07/28/1950 (71 y.o. [name] Primary Care Zailen Albarran: [name] Other Clinician: Referring Yentl Verge: Treating Judie Hollick/Extender: [name] in Treatment: 62 Compression Therapy Performed for Wound Assessment: Wound  #22 Left,Lateral Lower Leg Performed By: Clinician Baruch Gouty, RN Compression Type: Double Layer Post Procedure Diagnosis Same as Pre-procedure Electronic Signature(s) Signed: 02/15/2022 5:23:24 PM By: Baruch Gouty RN, BSN Entered By: Baruch Gouty on 02/15/2022 09:17:01 -------------------------------------------------------------------------------- Encounter Discharge Information Details Patient Name: Date of Service: [name]. 02/15/2022 8:15 A M Medical Record Number: [id number] Patient Account Number: [id number] Date of Birth/Sex: Treating RN: Jun 25, 1951 (71 y.o. [name] Primary Care Leib Elahi: [name] Other Clinician: Referring Donnisha Besecker: Treating Devanta Daniel/Extender: [name] in Treatment: 94 Encounter Discharge Information Items Post Procedure Vitals Discharge Condition: Stable Temperature (F): 97.9 Ambulatory Status: Ambulatory Pulse (bpm): 58 Discharge Destination: Home Respiratory Rate (breaths/min): 18 Transportation: Private Auto Blood Pressure (mmHg): 144/72 Accompanied By: sister Schedule Follow-up Appointment: Yes Clinical Summary of Care: Patient Declined Electronic Signature(s) Signed: 02/15/2022 5:23:24 PM By: Baruch Gouty RN, BSN Entered By: Baruch Gouty on 02/15/2022 09:18:19 -------------------------------------------------------------------------------- Lower Extremity Assessment Details Patient Name: Date of Service: [name]. 02/15/2022 8:15 A M Medical Record Number: [id number] Patient Account Number: [id number] Date of Birth/Sex: Treating RN: [birth year] (71 y.o. [name] Primary Care Georgine Wiltse: [name] Other Clinician: Referring Michaeljoseph Revolorio: Treating Anairis Knick/Extender: [name] in Treatment: 44 Edema Assessment Assessed: [name]: No] [name]: No] Edema: [Left: Ye] [Right: s] Calf Left: Right: Point of Measurement: 41 cm From Medial Instep  43.5 cm Ankle Left: Right: Point of Measurement: 10 cm From Medial Instep 28.5 cm Vascular Assessment Pulses: Dorsalis Pedis Palpable: [Left:Yes] Electronic Signature(s) Signed: 02/15/2022 5:23:24 PM By: Baruch Gouty RN, BSN Entered By: Baruch Gouty on 02/15/2022 08:30:24 -------------------------------------------------------------------------------- Multi Wound Chart Details Patient Name: Date of Service: [name]. 02/15/2022 8:15 A M Medical Record Number: [id number] Patient Account Number: [id number] Date of Birth/Sex: Treating RN: Apr 05, 1951 (71 y.o. M) Primary Care Tamer Baughman: [name] Other Clinician: Referring Lashaundra Lehrmann: Treating Abhijot Straughter/Extender: [name] in Treatment: 44 Vital Signs Height(in): 74 Capillary Blood Glucose(mg/dl):  306 Weight(lbs): 238 Pulse(bpm): 65 Body Mass Index(BMI): 30.6 Blood Pressure(mmHg): 144/72 Temperature(F): 97.9 Respiratory Rate(breaths/min): 18 Photos: [N/A:N/A] Left, Lateral Lower Leg Left, Medial Foot N/A Wound Location: Bump Blister N/A Wounding Event: Cyst Diabetic Wound/Ulcer of the Lower N/A Primary Etiology: Extremity Glaucoma, Sleep Apnea, Glaucoma, Sleep Apnea, N/A Comorbid History: Hypertension, Peripheral Arterial Hypertension, Peripheral Arterial Disease, Peripheral Venous Disease, Disease, Peripheral Venous Disease, Type II Diabetes, Gout, Osteoarthritis, Type II Diabetes, Gout, Osteoarthritis, Neuropathy Neuropathy 06/03/2021 02/01/2022 N/A Date Acquired: 36 2 N/A Weeks of Treatment: Open Open N/A Wound Status: No No N/A Wound Recurrence: Yes No N/A Clustered Wound: 3 N/A N/A Clustered Quantity: 14.8x1.4x0.1 0.2x1.2x0.1 N/A Measurements L x W x D (cm) 16.273 0.188 N/A A (cm) : rea 1.627 0.019 N/A Volume (cm) : -886.80% 81.60% N/A % Reduction in A rea: -23.40% 81.40% N/A % Reduction in Volume: Full Thickness With Exposed Support Grade 1  N/A Classification: Structures Medium Medium N/A Exudate A mount: Serosanguineous Serosanguineous N/A Exudate Type: red, brown red, brown N/A Exudate Color: Fibrotic scar, thickened scar Distinct, outline attached N/A Wound Margin: Large (67-100%) Large (67-100%) N/A Granulation A mount: Red Red N/A Granulation Quality: Small (1-33%) None Present (0%) N/A Necrotic A mount: Fat Layer (Subcutaneous Tissue): Yes Fat Layer (Subcutaneous Tissue): Yes N/A Exposed Structures: Fascia: No Fascia: No Tendon: No Tendon: No Muscle: No Muscle: No Joint: No Joint: No Bone: No Bone: No Medium (34-66%) Medium (34-66%) N/A Epithelialization: Debridement - Selective/Open Wound Debridement - Selective/Open Wound N/A Debridement: Pre-procedure Verification/Time Out 08:55 08:55 N/A Taken: Lidocaine 4% Topical Solution Lidocaine 4% T opical Solution N/A Pain Control: Necrotic/Eschar, Slough Callus, Slough N/A Tissue Debrided: Non-Viable Tissue Non-Viable Tissue N/A Level: 14 3 N/A Debridement A (sq cm): rea Curette Curette N/A Instrument: Minimum Minimum N/A Bleeding: Pressure Pressure N/A Hemostasis A chieved: 0 0 N/A Procedural Pain: 0 0 N/A Post Procedural Pain: Procedure was tolerated well Procedure was tolerated well N/A Debridement Treatment Response: 14.8x1.4x0.1 0.4x1.2x0.1 N/A Post Debridement Measurements L x W x D (cm) 1.627 0.038 N/A Post Debridement Volume: (cm) Compression Therapy Debridement N/A Procedures Performed: Debridement Treatment Notes Wound #22 (Lower Leg) Wound Laterality: Left, Lateral Cleanser Soap and Water Discharge Instruction: May shower and wash wound with dial antibacterial soap and water prior to dressing change. Wound Cleanser Discharge Instruction: Cleanse the wound with wound cleanser prior to applying a clean dressing using gauze sponges, not tissue or cotton balls. Peri-Wound Care Triamcinolone 15 (g) Discharge Instruction:  Use triamcinolone 15 (g) as directed Sween Lotion (Moisturizing lotion) Discharge Instruction: Apply moisturizing lotion to the leg Topical Primary Dressing KerraCel Ag Gelling Fiber Dressing, 4x5 in (silver alginate) Discharge Instruction: Apply silver alginate to wound bed as instructed Secondary Dressing ABD Pad, 8x10 Discharge Instruction: Apply over primary dressing as directed. Secured With Elastic Bandage 4 inch (ACE bandage) Discharge Instruction: Secure with ACE bandage as directed. Compression Wrap CoFlex TLC XL 2-layer Compression System 4x7 (in/yd) Discharge Instruction: Apply CoFlex 2-layer compression as directed. (alt for 4 layer) Compression Stockings Add-Ons Wound #27 (Foot) Wound Laterality: Left, Medial Cleanser Peri-Wound Care Sween Lotion (Moisturizing lotion) Discharge Instruction: Apply moisturizing lotion as directed Topical Ketoconazole Cream 2% Discharge Instruction: Apply Ketoconazole to top of foot Primary Dressing KerraCel Ag Gelling Fiber Dressing, 2x2 in (silver alginate) Discharge Instruction: Apply silver alginate to wound bed as instructed Secondary Dressing Optifoam Non-Adhesive Dressing, 4x4 in Discharge Instruction: Apply over primary dressing cut to make foam donut Woven Gauze Sponges 2x2 in Discharge Instruction: Apply over primary  dressing as directed. Secured With 53M Medipore H Soft Cloth Surgical T ape, 4 x 10 (in/yd) Discharge Instruction: Secure with tape as directed. Compression Wrap CoFlex TLC XL 2-layer Compression System 4x7 (in/yd) Discharge Instruction: Apply CoFlex 2-layer compression as directed. (alt for 4 layer) Compression Stockings Add-Ons Electronic Signature(s) Signed: 02/15/2022 9:20:02 AM By: Fredirick Maudlin MD FACS Entered By: Fredirick Maudlin on 02/15/2022 09:20:02 -------------------------------------------------------------------------------- Multi-Disciplinary Care Plan Details Patient Name: Date of  Service: [name]. 02/15/2022 8:15 A M Medical Record Number: [id number] Patient Account Number: [id number] Date of Birth/Sex: Treating RN: 10/18/1950 (71 y.o. [name] Primary Care Anival Pasha: [name] Other Clinician: Referring Egbert Seidel: Treating Avett Reineck/Extender: [name] in Treatment: 29 Multidisciplinary Care Plan reviewed with physician Active Inactive Venous Leg Ulcer Nursing Diagnoses: Knowledge deficit related to disease process and management Potential for venous Insuffiency (use before diagnosis confirmed) Goals: Patient will maintain optimal edema control Date Initiated: 07/27/2021 Target Resolution Date: 03/15/2022 Goal Status: Active Interventions: Assess peripheral edema status every visit. Treatment Activities: Therapeutic compression applied : 07/27/2021 Notes: Wound/Skin Impairment Nursing Diagnoses: Impaired tissue integrity Knowledge deficit related to ulceration/compromised skin integrity Goals: Patient will have a decrease in wound volume by X% from date: (specify in notes) Date Initiated: 04/12/2021 Date Inactivated: 01/04/2022 Target Resolution Date: 04/23/2021 Goal Status: Met Patient/caregiver will verbalize understanding of skin care regimen Date Initiated: 01/04/2022 Target Resolution Date: 03/01/2022 Goal Status: Active Ulcer/skin breakdown will have a volume reduction of 30% by week 4 Date Initiated: 04/12/2021 Date Inactivated: 04/27/2021 Target Resolution Date: 04/27/2021 Goal Status: Unmet Unmet Reason: infection Ulcer/skin breakdown will have a volume reduction of 50% by week 8 Date Initiated: 04/27/2021 Date Inactivated: 06/29/2021 Target Resolution Date: 06/24/2021 Goal Status: Met Interventions: Assess patient/caregiver ability to obtain necessary supplies Assess patient/caregiver ability to perform ulcer/skin care regimen upon admission and as needed Assess ulceration(s) every  visit Notes: Electronic Signature(s) Signed: 02/15/2022 5:23:24 PM By: Baruch Gouty RN, BSN Entered By: Baruch Gouty on 02/15/2022 08:42:58 -------------------------------------------------------------------------------- Pain Assessment Details Patient Name: Date of Service: [name]. 02/15/2022 8:15 A M Medical Record Number: [id number] Patient Account Number: [id number] Date of Birth/Sex: Treating RN: Jun 20, 1951 (71 y.o. [name] Primary Care Belisa Eichholz: [name] Other Clinician: Referring Briante Loveall: Treating Rudolf Blizard/Extender: [name] in Treatment: 24 Active Problems Location of Pain Severity and Description of Pain Patient Has Paino Yes Site Locations Pain Location: Pain in Ulcers With Dressing Change: Yes Duration of the Pain. Constant / Intermittento Intermittent Rate the pain. Current Pain Level: 3 Worst Pain Level: 7 Least Pain Level: 0 Character of Pain Describe the Pain: Aching, Sharp Pain Management and Medication Current Pain Management: Is the Current Pain Management Adequate: Adequate How does your wound impact your activities of daily livingo Sleep: No Bathing: No Appetite: No Relationship With Others: No Bladder Continence: No Emotions: No Bowel Continence: No Work: No Toileting: No Drive: No Dressing: No Hobbies: No Notes pain in wound with walking and bending knee Electronic Signature(s) Signed: 02/15/2022 5:23:24 PM By: Baruch Gouty RN, BSN Entered By: Baruch Gouty on 02/15/2022 08:24:59 -------------------------------------------------------------------------------- Patient/Caregiver Education Details Patient Name: Date of Service: [name] 8/24/2023andnbsp8:15 A M Medical Record Number: [id number] Patient Account Number: [id number] Date of Birth/Gender: Treating RN: 08/26/1950 (70 y.o. [name] Primary Care Physician: [name] Other Clinician: Referring  Physician: Treating Physician/Extender: [name] in Treatment: 80 Education Assessment Education Provided To: Patient Education Topics Provided Elevated Blood  Sugar/ Impact on Healing: Methods: Explain/Verbal Responses: Reinforcements needed, State content correctly Venous: Methods: Explain/Verbal Responses: Reinforcements needed, State content correctly Wound/Skin Impairment: Methods: Explain/Verbal Responses: Reinforcements needed, State content correctly Electronic Signature(s) Signed: 02/15/2022 5:23:24 PM By: Baruch Gouty RN, BSN Entered By: Baruch Gouty on 02/15/2022 08:43:32 -------------------------------------------------------------------------------- Wound Assessment Details Patient Name: Date of Service: [name]. 02/15/2022 8:15 A M Medical Record Number: [id number] Patient Account Number: [id number] Date of Birth/Sex: Treating RN: 07-29-1950 (71 y.o. [name] Primary Care Charnell Peplinski: [name] Other Clinician: Referring Daruis Swaim: Treating Adama Ivins/Extender: [name] in Treatment: 44 Wound Status Wound Number: 22 Primary Cyst Etiology: Wound Location: Left, Lateral Lower Leg Wound Open Wounding Event: Bump Status: Date Acquired: 06/03/2021 Comorbid Glaucoma, Sleep Apnea, Hypertension, Peripheral Arterial Disease, Weeks Of Treatment: 36 History: Peripheral Venous Disease, Type II Diabetes, Gout, Osteoarthritis, Clustered Wound: Yes Neuropathy Photos Wound Measurements Length: (cm) 14.8 Width: (cm) 1.4 Depth: (cm) 0.1 Clustered Quantity: 3 Area: (cm) 16.273 Volume: (cm) 1.627 % Reduction in Area: -886.8% % Reduction in Volume: -23.4% Epithelialization: Medium (34-66%) Tunneling: No Undermining: No Wound Description Classification: Full Thickness With Exposed Support Structures Wound Margin: Fibrotic scar, thickened scar Exudate Amount: Medium Exudate Type:  Serosanguineous Exudate Color: red, brown Foul Odor After Cleansing: No Slough/Fibrino Yes Wound Bed Granulation Amount: Large (67-100%) Exposed Structure Granulation Quality: Red Fascia Exposed: No Necrotic Amount: Small (1-33%) Fat Layer (Subcutaneous Tissue) Exposed: Yes Necrotic Quality: Adherent Slough Tendon Exposed: No Muscle Exposed: No Joint Exposed: No Bone Exposed: No Treatment Notes Wound #22 (Lower Leg) Wound Laterality: Left, Lateral Cleanser Soap and Water Discharge Instruction: May shower and wash wound with dial antibacterial soap and water prior to dressing change. Wound Cleanser Discharge Instruction: Cleanse the wound with wound cleanser prior to applying a clean dressing using gauze sponges, not tissue or cotton balls. Peri-Wound Care Triamcinolone 15 (g) Discharge Instruction: Use triamcinolone 15 (g) as directed Sween Lotion (Moisturizing lotion) Discharge Instruction: Apply moisturizing lotion to the leg Topical Primary Dressing KerraCel Ag Gelling Fiber Dressing, 4x5 in (silver alginate) Discharge Instruction: Apply silver alginate to wound bed as instructed Secondary Dressing ABD Pad, 8x10 Discharge Instruction: Apply over primary dressing as directed. Secured With Elastic Bandage 4 inch (ACE bandage) Discharge Instruction: Secure with ACE bandage as directed. Compression Wrap CoFlex TLC XL 2-layer Compression System 4x7 (in/yd) Discharge Instruction: Apply CoFlex 2-layer compression as directed. (alt for 4 layer) Compression Stockings Add-Ons Electronic Signature(s) Signed: 02/15/2022 5:23:24 PM By: Baruch Gouty RN, BSN Entered By: Baruch Gouty on 02/15/2022 08:44:18 -------------------------------------------------------------------------------- Wound Assessment Details Patient Name: Date of Service: [name]. 02/15/2022 8:15 A M Medical Record Number: [id number] Patient Account Number: [id number] Date of Birth/Sex: Treating  RN: 1951-02-21 (72 y.o. [name] Primary Care Keymoni Mccaster: [name] Other Clinician: Referring Awanda Wilcock: Treating Soriyah Osberg/Extender: [name] in Treatment: 44 Wound Status Wound Number: 27 Primary Diabetic Wound/Ulcer of the Lower Extremity Etiology: Wound Location: Left, Medial Foot Wound Open Wounding Event: Blister Status: Date Acquired: 02/01/2022 Comorbid Glaucoma, Sleep Apnea, Hypertension, Peripheral Arterial Disease, Weeks Of Treatment: 2 History: Peripheral Venous Disease, Type II Diabetes, Gout, Osteoarthritis, Clustered Wound: No Neuropathy Photos Wound Measurements Length: (cm) 0.2 Width: (cm) 1.2 Depth: (cm) 0.1 Area: (cm) 0.188 Volume: (cm) 0.019 % Reduction in Area: 81.6% % Reduction in Volume: 81.4% Epithelialization: Medium (34-66%) Tunneling: No Undermining: No Wound Description Classification: Grade 1 Wound Margin: Distinct, outline attached Exudate Amount: Medium Exudate Type: Serosanguineous Exudate Color: red, brown  Foul Odor After Cleansing: No Slough/Fibrino No Wound Bed Granulation Amount: Large (67-100%) Exposed Structure Granulation Quality: Red Fascia Exposed: No Necrotic Amount: None Present (0%) Fat Layer (Subcutaneous Tissue) Exposed: Yes Tendon Exposed: No Muscle Exposed: No Joint Exposed: No Bone Exposed: No Treatment Notes Wound #27 (Foot) Wound Laterality: Left, Medial Cleanser Peri-Wound Care Sween Lotion (Moisturizing lotion) Discharge Instruction: Apply moisturizing lotion as directed Topical Ketoconazole Cream 2% Discharge Instruction: Apply Ketoconazole to top of foot Primary Dressing KerraCel Ag Gelling Fiber Dressing, 2x2 in (silver alginate) Discharge Instruction: Apply silver alginate to wound bed as instructed Secondary Dressing Optifoam Non-Adhesive Dressing, 4x4 in Discharge Instruction: Apply over primary dressing cut to make foam donut Woven Gauze Sponges 2x2  in Discharge Instruction: Apply over primary dressing as directed. Secured With 5M Medipore H Soft Cloth Surgical T ape, 4 x 10 (in/yd) Discharge Instruction: Secure with tape as directed. Compression Wrap CoFlex TLC XL 2-layer Compression System 4x7 (in/yd) Discharge Instruction: Apply CoFlex 2-layer compression as directed. (alt for 4 layer) Compression Stockings Add-Ons Electronic Signature(s) Signed: 02/15/2022 5:23:24 PM By: Baruch Gouty RN, BSN Signed: 02/15/2022 5:51:42 PM By: Dellie Catholic RN Entered By: Dellie Catholic on 02/15/2022 08:40:23 -------------------------------------------------------------------------------- Vitals Details Patient Name: Date of Service: [name]. 02/15/2022 8:15 A M Medical Record Number: [id number] Patient Account Number: [id number] Date of Birth/Sex: Treating RN: 1950/09/05 (71 y.o. [name] Primary Care Berkley Cronkright: [name] Other Clinician: Referring Sabine Tenenbaum: Treating Krysten Veronica/Extender: [name] in Treatment: 31 Vital Signs Time Taken: 08:22 Temperature (F): 97.9 Height (in): 74 Pulse (bpm): 58 Weight (lbs): 238 Respiratory Rate (breaths/min): 18 Body Mass Index (BMI): 30.6 Blood Pressure (mmHg): 144/72 Capillary Blood Glucose (mg/dl): 306 Reference Range: 80 - 120 mg / dl Notes glucose at present per pt's meter Electronic Signature(s) Signed: 02/15/2022 5:23:24 PM By: Baruch Gouty RN, BSN Entered By: Baruch Gouty on 02/15/2022 08:23:50

## 2022-02-16 ENCOUNTER — Encounter: Payer: Self-pay | Admitting: Nurse Practitioner

## 2022-02-19 NOTE — Telephone Encounter (Signed)
Any idea where I would send a referral to?  I went through the list and none of them seemed to be right.

## 2022-02-22 ENCOUNTER — Encounter (HOSPITAL_BASED_OUTPATIENT_CLINIC_OR_DEPARTMENT_OTHER): Payer: Medicare Other | Admitting: General Surgery

## 2022-02-22 DIAGNOSIS — E1151 Type 2 diabetes mellitus with diabetic peripheral angiopathy without gangrene: Secondary | ICD-10-CM | POA: Diagnosis not present

## 2022-02-22 NOTE — Progress Notes (Signed)
[name], [name] ([id number]) Visit Report for 02/22/2022 Chief Complaint Document Details Patient Name: Date of Service: [name], [name] 02/22/2022 10:30 A M Medical Record Number: [id number] Patient Account Number: [id number] Date of Birth/Sex: Treating RN: 16-Aug-1950 (71 y.o. [name] Primary Care Provider: Jilda [name] Other Clinician: Referring Provider: Treating Provider/Extender: [name] in Treatment: 59 Information Obtained from: Patient Chief Complaint Left leg and foot ulcers 04/12/2021; patient is here for wounds on his left lower leg and left plantar foot over the first metatarsal head Electronic Signature(s) Signed: 02/22/2022 11:30:15 AM By: [name] Maudlin MD FACS Entered By: [name] on 02/22/2022 11:30:15 -------------------------------------------------------------------------------- Debridement Details Patient Name: Date of Service: [name]. 02/22/2022 10:30 A M Medical Record Number: [id number] Patient Account Number: [id number] Date of Birth/Sex: Treating RN: March 16, 1951 (71 y.o. [name] Primary Care Provider: Jilda [name] Other Clinician: Referring Provider: Treating Provider/Extender: [name] in Treatment: 45 Debridement Performed for Assessment: Wound #27 Left,Medial Foot Performed By: Physician [name] Maudlin, MD Debridement Type: Debridement Severity of Tissue Pre Debridement: Fat layer exposed Level of Consciousness (Pre-procedure): Awake and Alert Pre-procedure Verification/Time Out Yes - 11:20 Taken: Start Time: 11:20 Pain Control: Lidocaine 4% T opical Solution T Area Debrided (L x W): otal 0.5 (cm) x 0.5 (cm) = 0.25 (cm) Tissue and other material debrided: Non-Viable, Callus, Slough, Slough Level: Non-Viable Tissue Debridement Description: Selective/Open Wound Instrument: Curette Bleeding: Minimum Hemostasis Achieved: Pressure Procedural Pain: 0 Post  Procedural Pain: 0 Response to Treatment: Procedure was tolerated well Level of Consciousness (Post- Awake and Alert procedure): Post Debridement Measurements of Total Wound Length: (cm) 0.3 Width: (cm) 0.3 Depth: (cm) 0.1 Volume: (cm) 0.007 Character of Wound/Ulcer Post Debridement: Improved Severity of Tissue Post Debridement: Fat layer exposed Post Procedure Diagnosis Same as Pre-procedure Electronic Signature(s) Signed: 02/22/2022 12:18:52 PM By: [name] Maudlin MD FACS Signed: 02/22/2022 6:25:40 PM By: [name] Gouty RN, BSN Entered By: [name] on 02/22/2022 11:24:53 -------------------------------------------------------------------------------- Debridement Details Patient Name: Date of Service: [name]. 02/22/2022 10:30 A M Medical Record Number: [id number] Patient Account Number: [id number] Date of Birth/Sex: Treating RN: 1951-04-21 (71 y.o. [name] Primary Care Provider: Jilda [name] Other Clinician: Referring Provider: Treating Provider/Extender: [name] in Treatment: 45 Debridement Performed for Assessment: Wound #22 Left,Lateral Lower Leg Performed By: Physician [name] Maudlin, MD Debridement Type: Debridement Level of Consciousness (Pre-procedure): Awake and Alert Pre-procedure Verification/Time Out Yes - 11:20 Taken: Start Time: 11:20 Pain Control: Lidocaine 4% T opical Solution T Area Debrided (L x W): otal 7 (cm) x 1.2 (cm) = 8.4 (cm) Tissue and other material debrided: Non-Viable, Slough, Slough Level: Non-Viable Tissue Debridement Description: Selective/Open Wound Instrument: Curette Bleeding: Minimum Hemostasis Achieved: Pressure Procedural Pain: 0 Post Procedural Pain: 0 Response to Treatment: Procedure was tolerated well Level of Consciousness (Post- Awake and Alert procedure): Post Debridement Measurements of Total Wound Length: (cm) 12 Width: (cm) 1.2 Depth: (cm) 0.1 Volume: (cm)  1.131 Character of Wound/Ulcer Post Debridement: Improved Post Procedure Diagnosis Same as Pre-procedure Electronic Signature(s) Signed: 02/22/2022 12:18:52 PM By: [name] Maudlin MD FACS Signed: 02/22/2022 6:25:40 PM By: [name] Gouty RN, BSN Entered By: [name] on 02/22/2022 11:26:20 -------------------------------------------------------------------------------- HPI Details Patient Name: Date of Service: [name]. 02/22/2022 10:30 A M Medical Record Number: [id number] Patient Account Number: [id number] Date of Birth/Sex: Treating RN: 1950/07/04 (71 y.o. [name] Primary Care Provider: Other Clinician: Jilda [name] Referring Provider: Treating Provider/Extender:  [name] Weeks in Treatment: 58 History of Present Illness HPI Description: 10/11/17; [name] is a 71 year old man who tells me that in 2015 he slipped down the latter traumatizing his left leg. He developed a wound in the same spot the area that we are currently looking at. He states this closed over for the most part although he always felt it was somewhat unstable. In 2016 he hit the same area with the door of his car had this reopened. He tells me that this is never really closed although sometimes an inflow it remains open on a constant basis. He has not been using any specific dressing to this except for topical antibiotics the nature of which were not really sure. His primary doctor did send him to see Dr. Einar [name] of interventional cardiology. He underwent an angiogram on 08/06/17 and he underwent a PTA and directional atherectomy of the lesser distal SFA and popliteal arteries which resulted in brisk improvement in blood flow. It was noted that he had 2 vessel runoff through the anterior tibial and peroneal. He is also been to see vascular and interventional radiologist. He was not felt to have any significant superficial venous insufficiency. Presumably is not a candidate for  any ablation. It was suggested he come here for wound care. The patient is a type II diabetic on insulin. He also has a history of venous insufficiency. ABIs on the left were noncompressible in our clinic 10/21/17; patient we admitted to the clinic last week. 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 We put Iodosorb on him after an aggressive debridement and 3 layer compression. He complained of pain in his ankle and itching with is skin in fact he scratched the area on the medial calf superiorly at the rim of our wraps and he has 2 small open areas in that location today which are new. I changed his primary dressing today to silver collagen. As noted he is already had revascularization and does not have any significant superficial venous insufficiency that would be amenable to ablation 10/28/17; patient admitted to the clinic 2 weeks ago. He has a smaller Wound. Scratch injury from last week revealed. There is large wound over the tibial area. This is smaller. Granulation looks healthy. No need for debridement. 11/04/17; the wound on the left lateral calf looks better. Improved dimensions. Surface of this looks better. We've been maintaining him and Kerlix Coban wraps. He finds this much more comfortable. Silver collagen dressing 11/11/17; left lateral Wound continues to look healthy be making progress. Using a #5 curet I removed removed nonviable skin from the surface of the wound and then necrotic debris from the wound surface. Surface of the wound continues to look healthy. He also has an open area on the left great toenail bed. We've been using topical antibiotics. 11/19/17; left anterior lateral wound continues to look healthy but it's not closed. He also had a small wound above this on the left leg Initially traumatic wounds in the setting of significant chronic venous insufficiency and stasis dermatitis 11/25/17; left anterior  wounds superiorly is closed still a small wound inferiorly. 12/02/17; left anterior tibial area. Arrives today with adherent callus. Post debridement clearly not completely closed. Hydrofera Blue under 3 layer compression. 12/09/17; left anterior tibia. Circumferential eschar however the wound bed looks stable to improved. We've been using Hydrofera Blue under 3 layer compression 12/17/17; left anterior tibia. Apparently this was  felt to be closed however when the wrap was taken off there is a skin tear to reopen wounds in the same area we've been using Hydrofera Blue under 3 layer compression 12/23/17 left anterior tibia. Not close to close this week apparently the [facility] was stuck to this again. Still circumferential eschar requiring debridement. I put a contact layer on this this time under the Hydrofera Blue 12/31/17; left anterior tibia. Wound is better slight amount of hyper-granulation. Using Hydrofera Blue over Adaptic. 01/07/18; left anterior tibia. The wound had some surface eschar however after this was removed he has no open wound.he was already revascularized by Dr. Einar [name] when he came to our clinic with atherectomy of the left SFA and popliteal artery. He was also sent to interventional radiology for venous reflux studies. He was not felt to have significant reflux but certainly has chronic venous changes of his skin with hemosiderin deposition around this area. He will definitely need to lubricate his skin and wear compression stocking and I've talked to him about this. READMISSION 05/26/2018 This is a now 71 year old man we cared for with traumatic wounds on his left anterior lower extremity. He had been previously revascularized during that admission by Dr. Einar [name]. Apparently in follow-up Dr. Einar [name] noted that he had deterioration in his arterial status. He underwent a stent placement in the distal left SFA on 04/22/2018. 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 He went  back to the angiography suite on 04/30/2018 he underwent PTA and balloon angioplasty of the occluded left mid anterior tibial artery, thrombotic occlusion went from 100 to 0% which reconstitutes the posterior tibial artery. He had thrombectomy and aspiration of the peroneal artery. The stent placed in the distal SFA left SFA was still occluded. 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 He has been recuperating [place] in [facility] after the most recent hospitalization. He is going home tomorrow. He tells me that 2 weeks ago he traumatized the tip of his left fifth toe. He came in urgently for our review of this. This was a history of before I noted that Dr. Einar [name] had already noted dry gangrenous changes of the left fifth toe 06/09/2018; 2-week follow-up. I did contact Dr. Einar [name] after his last appointment and he apparently saw 1 of Dr. Irven Shelling colleagues the next day. He does not follow-up with Dr. Einar [name] himself until Thursday of this week. 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 Nevertheless there is no evidence of infection no drainage and no pain. He had a new area that this week when we were signing him in today on the left anterior mid tibia area, this is in close proximity to the previous wound we have dealt with in this clinic. 06/23/2018; 2-week follow-up. I did not receive a recent note from Dr. Einar [name] to review today. Our office is trying to obtain this. He is apparently not planning to do further vascular interventions and wondered about compression to try and help with the patient's chronic venous insufficiency. However we are also concerned about the arterial flow. He arrives in clinic today with a new  area on the left third toe. The areas on the calf/anterior tibia are  close to closing. The left fifth toe is still mummified using Betadine. -In reviewing things with the patient he has what sounds like claudication with mild to moderate amount of activity. 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 I prescribed Levaquin over the phone while we attempted to arrange a plan of care. However the patient called yesterday to report he had low-grade fever and he came in today acutely. There is been a marked deterioration in the left third toe with spreading cellulitis up into the dorsal left foot. He was referred to the emergency room. Readmission: 06/29/2020 patient presents today for reevaluation here in our clinic he was previously treated by Dr. Dellia Nims at the latter part of 2019 in 2 the beginning of 2020. Subsequently we have not seen him since that time in the interim he did have evaluation with vein and vascular specialist specifically Dr. Anice Paganini who did perform quite extensive work for a left femoral to anterior tibial artery bypass. With that being said in the interim the patient has developed significant lymphedema and has wounds that he tells me have really never healed in regard to the incision site on the left leg. He also has multiple wounds on the feet for various reasons some of which is that he tends to pick at his feet. Fortunately there is no signs of active infection systemically at this time he does have some wounds that are little bit deeper but most are fairly superficial he seems to have good blood flow and overall everything appears to be healthy I see no bone exposed and no obvious signs of osteomyelitis. I do not know that he necessarily needs a x-ray at this point although that something we could consider depending on how things progress. The patient does have a history of lymphedema, diabetes, this is type II, chronic kidney disease stage III,  hypertension, and history of peripheral vascular disease. 07/05/2020; patient admitted last week. Is a patient I remember from 2019 he had a spreading infection involving the left foot and we sent him to the hospital. 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 He subsequently had a left femoral to anterior tibial bypass by Dr.Cain vein and vascular. He also has severe lymphedema with chronic skin changes related to that on the left leg. The most problematic area that was new today was on the left medial great toe. This was apparently a small area last week there was purulent drainage which our intake nurse cultured. Also areas on the left medial foot and heel left lateral foot. He has 2 areas on the left medial calf left lateral calf in the setting of the severe lymphedema. 07/13/2020 on evaluation today patient appears to be doing better in my opinion compared to his last visit. The good news is there is no signs of active infection systemically and locally I do not see any signs of infection either. He did have an x-ray which was negative that is great news he had a culture which showed MRSA but at the same time he is been on the doxycycline which has helped. I do think we may want to extend this for 7 additional days 1/25; patient admitted to the clinic a few weeks ago. 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 We have been putting him under compression his edema control is a lot better but he is severe verricused skin  on the left leg. He is really done quite well he still has an open area on the left medial calf and the left medial first metatarsal head. We have been using silver collagen on the leg silver alginate on the foot 07/27/2020 upon evaluation today patient appears to be doing decently well in regard to his wounds. He still has a lot of dry skin on the left leg. Some of this is starting to peel back and I  think he may be able to have them out by removing some that today. Fortunately there is no signs of active infection at this time on the left leg although on the right leg he does appear to have swelling and erythema as well as some mild warmth to touch. This does have been concerned about the possibility of cellulitis although within the differential diagnosis I do think that potentially a DVT has to be at least considered. We need to rule that out before proceeding would just call in the cellulitis. Especially since he is having pain in the posterior aspect of his calf muscle. 2/8; the patient had seen sparingly. He has severe skin changes of chronic lymphedema in the left leg thickened hyperkeratotic verrucous skin. He has an open wound on the medial part of the left first met head left mid tibia. He also has a rim of nonepithelialized skin in the anterior mid tibia. He brought in the AmLactin lotion that was been prescribed although I am not sure under compression and its utility. There concern about cellulitis on the right lower leg the last time he was here. He was put on on antibiotics. His DVT rule out was negative. The right leg looks fine he is using his stocking on this area 08/10/2020 upon evaluation today patient appears to be doing well with regard to his leg currently. He has been tolerating the dressing changes without complication. Fortunately there is no signs of active infection which is great news. Overall very pleased with where things stand. 2/22; the patient still has an area on the medial part of the left first met his head. This looks better than when I last saw this earlier this month he has a rim of epithelialization but still some surface debris. Mostly everything on the left leg is healed. There is still a vulnerable in the left mid tibia area. 08/30/2020 upon evaluation today patient appears to be doing much better in regard to his wounds on his foot. Fortunately there does  not appear to be any signs of active infection systemically though locally we did culture this last week and it does appear that he does have MRSA currently. Nonetheless I think we will address that today I Minna send in a prescription for him in that regard. Overall though there does not appear to be any signs of significant worsening. 09/07/2020 on evaluation today patient's wounds over his left foot appear to be doing excellent. I do not see any signs of infection there is some callus buildup this can require debridement for certain but overall I feel like he is managing quite nicely. He still using the AmLactin cream which has been beneficial for him as well. 3/22; left foot wound is closed. There is no open area here. He is using ammonium lactate lotion to the lower extremities to help exfoliate dry cracked skin. He has compression stockings from elastic therapy in [place]. The wound on the medial part of his left first met head is healed today. READMISSION 04/12/2021 Mr.  Aispuro is a patient we know fairly well he had a prolonged stay in clinic in 2019 with wounds on his left lateral and left anterior lower extremity in the setting of chronic venous insufficiency. More recently he was here earlier this year with predominantly an area on his left foot first metatarsal head plantar and he says the plantar foot broke down on its not long after we discharged him but he did not come back here. The last few months areas of broken down on his left anterior and again the left lateral lower extremity. The leg itself is very swollen chronically enlarged a lot of hyperkeratotic dry Berry Q skin in the left lower leg. His edema extends well into the thigh. He was seen by Dr. Donzetta Matters. He had ABIs on 03/02/2021 showing an ABI on the right of 1 with a TBI of 0.72 his ABI in the left at 1.09 TBI of 0.99. Monophasic and biphasic waveforms on the right. On the left monophasic waveforms were noted he went on to have an  angiogram on 03/27/2021 this showed the aortic aortic and iliac segments were free of flow-limiting stenosis the left common femoral vein to evaluate the left femoral to anterior tibial artery bypass was unobstructed the bypass was patent without any areas of stenosis. We discharged the patient in bilateral juxta lite stockings but very clearly that was not sufficient to control the swelling and maintain skin integrity. He is clearly going to need compression pumps. 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 This is fortunate because he is on his feet for long periods of time. 10/27; patient comes in with our intake nurse reporting copious amount of green drainage from the left anterior mid tibia the left dorsal foot and to a lesser extent the left medial mid tibia. We left the compression wrap on all week for the amount of edema in his left leg is quite a bit better. We use silver alginate as the primary dressing 11/3; edema control is good. Left anterior lower leg left medial lower leg and the plantar first metatarsal head. The left anterior lower leg required debridement. Deep tissue culture I did of this wound showed MRSA I put him on 10 days of doxycycline which she will start today. We have him in compression wraps. He has a security card and [employer] however he is retiring on November 15. We will need to then get him into a better offloading boot for the left foot perhaps a total contact cast 11/10; edema control is quite good. Left anterior and left medial lower leg wounds in the setting of chronic venous insufficiency and lymphedema. He also has a substantial area over the left plantar first metatarsal head. 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 He has significant hypergranulation on the left plantar foot wound. The patient is a diabetic but he does  not have significant PAD 11/17; edema control is quite good. Left anterior and left medial lower leg wounds look better. The really concerning area remains the area on the left plantar first metatarsal head. He has a rim of epithelialization. 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 Starting today with a forefoot off loader but .  possibly a total contact cast. 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 Arrives with a very significant odor from I think all of the wounds on the left leg and the left foot. Because of this I did not put a total contact cast on him today but will could still consider this. His wife was having cataract surgery which is the reason he missed the appointment 12/6. I saw this man 5 days ago with a swelling below the popliteal fossa. I thought he actually might have a Baker's cyst however the DVT rule out study that we could arrange right away was negative the technician told me this was not a ruptured Baker's cyst. We attempted to get this aspirated by under ultrasound guidance in interventional radiology however all they did was an ultrasound however it shows an extensive fluid collection 62 x 8 x 9.4 in the left thigh and left calf. The patient states he thinks this started 8 days ago or so but he really is not complaining of any pain, fever or systemic symptoms. He has not ha 12/20; after some difficulty I managed to get the patient into see Dr. Donzetta Matters. Eventually he was taken into the hospital and had a drain put in the fluid collection below his left knee posteriorly extending into the posterior thigh. He still has the drain in place. Culture of this showed moderate staff aureus few Morganella and few Klebsiella he is now on doxycycline and  ciprofloxacin as suggested by infectious disease he is on this for a month. The drain will remain in place until it stops draining 12/29; he comes in today with the 1 wound on his left leg and the area on the left plantar first met head significantly smaller. Both look healthy. He still has the drain in the left leg. He says he has to change this daily. Follows up with Dr. Donzetta Matters on January 11. 06/29/2021; the wounds that I am following on the left leg and left first met head continued to be quite healthy. However the area where his inferior drain is in place had copious amounts of drainage which was green in color. The wound here is larger. Follows up with Dr. Gwenlyn Saran of vein and vascular his surgeon next week as well as infectious disease. He remains on ciprofloxacin and doxycycline. He is not complaining of excessive pain in either one of the drain areas 1/12; the patient saw vascular surgery and infectious disease. Vascular surgery has left the drain in place as there was still some notable drainage still see him back in 2 weeks. Dr. Velna Ochs stop the doxycycline and ciprofloxacin and I do not believe he follows up with them at this point. Culture I did last week showed both doxycycline resistant MRSA and Pseudomonas not sensitive to ciprofloxacin although only in rare titers 1/19; the patient's wound on the left anterior lower leg is just about healed. We have continued healing of the area that was medially on the left leg. Left first plantar metatarsal head continues to get smaller. The major problem here is his 2 drain sites 1 on the left upper calf and lateral thigh. There is purulent drainage still from the left lateral thigh. I gave him antibiotics last week but we still have recultured. He has the drain in the area I think this is eventually going to have to come  out. I suspect there will be a connecting wound to heal here perhaps with improved VAc 1/26; the patient had his drain removed by vein and  vascular on 1/25/. This was a large pocket of fluid in his left thigh that seem to tunnel into his left upper calf. 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 His [name] his Penrose drain was removed today. He now has a tunneling wound on his left calf and left thigh. Both of these probe widely towards each other although I cannot really prove that they connect. Both wounds on his lower leg anteriorly are closed and his area over the first metatarsal head on his right foot continues to improve. We are using Hydrofera Blue here. He also saw infectious disease culture of the abscess they noted was polymicrobial with MRSA, Morganella and Klebsiella he was treated with doxycycline and ciprofloxacin for 4 weeks ending on 07/03/2021. They did not recommend any further antibiotics. Notable that while he still had the Penrose drain in place last week he had purulent drainage coming out of the inferior IandD site this grew [facility], MRSA and Pseudomonas but there does not appear to be any active infection in this area today with the drain out and he is not systemically unwell 2/2; with regards to the drain sites the superior one on the thigh actually is closed down the one on the upper left lateral calf measures about 8 and half centimeters which is an improvement seems to be less prominent although still with a lot of drainage. The only remaining wound is over the first metatarsal head on the left foot and this looks to be continuing to improve with Hydrofera Blue. 2/9; the area on his plantar left foot continues to contract. Callus around the wound edge. The drain sites specifically have not come down in depth. We put the wound VAC on Monday he changed the canister late last night our intake nurse reported a pocket of fluid perhaps caused by our compression wraps 2/16; continued improvement in left foot plantar wound. drainage site in the calf is not improved in terms of depth (wound  vac) 2/23; continued improvement in the left foot wound over the first metatarsal head. With regards to the drain sites the area on his thigh laterally is healed however the open area on his calf is small in terms of circumference by still probes in by about 15 cm. Within using the wound VAC. Hydrofera Blue on his foot 08/24/2021: The left first metatarsal head wound continues to improve. The wound bed is healthy with just some surrounding callus. Unfortunately the open drain site on his calf remains open and tunnels at least 15 cm (the extent of a Q-tip). This is despite several weeks of wound VAC treatment. Based on reading back through the notes, there has been really no significant change in the depth of the wound, although the orifice is smaller and the more cranial wound on his thigh has closed. I suspect the tunnel tracks nearly all the way to this location. 08/31/2021: Continued improvement in the left first metatarsal head wound. There has been absolutely no improvement to the long tunnel from his open drain site on his calf. We have tried to get him into see vascular surgery sooner to consider the possibility of simply filleting the tract open and allowing it to heal from the bottom up, likely with a wound VAC. They have not yet scheduled a sooner appointment than his current mid  April 09/14/2021: He was seen by vascular surgery and they took him to the operating room last week. They opened a portion of the tunnel, but did not extend the entire length of the known open subcutaneous tract. I read Dr. Claretha Cooper operative note and it is not clear from that documentation why only a portion of the tract was opened. The heaped up granulation tissue was curetted and removed from at least some portion of the tract. They did place a wound VAC and applied an Unna boot to the leg. The ulcer on his left first metatarsal head is smaller today. The bed looks good and there is just a small amount of surrounding  callus. 09/21/2021: The ulcer on his left first metatarsal head looks to be stalled. There is some callus surrounding the wound but the wound bed itself does not appear particularly dynamic. The tunnel tract on his lateral left leg seems to be roughly the same length or perhaps slightly smaller but the wound bed appears healthy with good granulation tissue. He opened up a new wound on his medial thigh and the site of a prior surgical incision. He says that he did this unconsciously in his sleep by scratching. 09/28/2021: Unfortunately, the ulcer on his left first metatarsal head has extended underneath the callus toward the dorsum of his foot. The medial thigh wounds are roughly the same. The tunnel on his lateral left leg continues to be problematic; it is longer than we are able to actually probe with a Q-tip. I am still not certain as to why Dr. Donzetta Matters did not open this up entirely when he took the patient to the operating room. We will likely be back in the same situation with just a small superficial opening in a long unhealed tract, as the open portion is granulating in nicely. 10/02/2021: The patient was initially scheduled for a nurse visit, but we are also applying a total contact cast today. The plantar foot wound looks clean without significant accumulated callus. We have been applying Prisma silver collagen to the site. 10/05/2021: The patient is here for his first total contact cast change. We have tried using gauze packing strips in the tunnel on his lateral leg wound, but this does not seem to be working any better than the white VAC foam. The foot ulcer looks about the same with minimal periwound callus. Medial thigh wound is clean with just some overlying eschar. 10/12/2021: The plantar foot wound is stable without any significant accumulation of periwound callus. The surface is viable with good granulation tissue. The medial thigh wounds are much smaller and are epithelializing. On the other  hand, he had purulent drainage coming from the tunnel on his lateral leg. He does go back to see Dr. Donzetta Matters next week and is planning to ask him why the wound tunnel was not completely opened at the time of his most recent operation. 10/19/2021: The plantar foot wound is markedly improved and has epithelial tissue coming through the surface. The medial thigh wounds are nearly closed with just a tiny open area. He did see Dr. Donzetta Matters earlier this week and apparently they did discuss the possibility of opening the sinus tract further and enabling a wound VAC application. Apparently there are some limits as to what Dr. 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 I think if we could get the tract open to the level of the popliteal fossa, this would greatly aid in her ability to get this chart  closed. That being said, however, today when I probed the tract with a Q-tip, I was not able to insert the entirety of the Q-tip as I have on previous occasions. The tunnel is shorter by about 4 cm. The surface is clean with good granulation tissue and no further episodes of purulent drainage. 10/30/2021: Last week, the patient underwent surgery and had the long tract in his leg opened. There was a rind that was debrided, according to the operative report. His medial thigh ulcers are closed. The plantar foot wound is clean with a good surface and some built up surrounding callus. 11/06/2021: The overall dimensions of the large wound on his lateral leg remain about the same, but there is good granulation tissue present and the tunneling is a little bit shorter. He has a new wound on his anterior tibial surface, in the same location where he had a similar lesion in the past. The plantar foot wound is clean with some buildup surrounding callus. Just toward the medial aspect of his foot, however, there is an area of darkening that once debrided, revealed another opening in the skin  surface. 11/13/2021: The anterior tibial surface wound is closed. The plantar foot wound has some surrounding callus buildup. The area of darkening that I debrided last week and revealed an opening in the skin surface has closed again. The tunnel in the large wound on his lateral leg has come in by about 3 cm. There is healthy granulation tissue on the entire wound surface. 11/23/2021: The patient was out of town last week and did wet-to-dry dressings on his large wound. 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 Unfortunately, he picked open the wound on his left medial thigh. He says that it was itching and he just could not stop scratching it until it was open again. The wound on his plantar foot is smaller and has not accumulated a tremendous amount of callus. The lateral leg wound is shallower and the tunnel has also decreased in depth. There is just a little bit of slough accumulation on the surface. 11/30/2021: Another portion of his left medial thigh has been opened up. All of these wounds are fairly superficial with just a little bit of slough and eschar accumulation. The wound on his plantar foot is almost closed with just a bit of eschar and periwound callus accumulation. The lateral leg wound is nearly flush with the surrounding skin and the tunnel is markedly shallower. 12/07/2021: There is just 1 open area on his left medial thigh. It is clean with just a little bit of perimeter eschar. The wound on his plantar foot continues to contract and just has some eschar and periwound callus accumulation. The lateral leg wound is closing at the more distal aspect and the tunnel is smaller. The surface is nearly flush with the surrounding skin and it has a good bed of granulation tissue. 12/14/2021: The thigh and foot wounds are closed. The lateral leg wound has closed over approximately half of its length. The tunnel continues to contract  and the surface is now flush with the surrounding skin. The wound bed has robust granulation tissue. 12/22/2021: The thigh and foot wounds have reopened. The foot wound has a lot of callus accumulation around and over it. The thigh wound is tiny with just a little bit of slough in the wound bed. The lateral leg wound continues to contract. His vascular surgeon took the  wound VAC off earlier in the week and the patient has been doing wet-to-dry dressings. There is a little slough accumulation on the surface. The tunnel is about 3 cm in depth at this point. 12/28/2021: The thigh wound is closed again. The foot wound has some callus that subsequently has peeled back exposing just a small slit of a wound. The lateral leg wound Is down to about half the size that it originally was and the tunnel is down to about half a centimeter in depth. 01/04/2022: The thigh wound remains closed. The foot wound has heavy callus overlying the wound site. Once this was debrided, the wound was found to be closed. The lateral leg wound is smaller again this week and very superficial. No tunnel could be identified. 01/12/2022: The thigh and foot wounds both remain closed. The lateral leg wound is now nearly flush with the skin surface. There is good granulation tissue present with a light layer of slough. 01/19/2022: Due to the way his wrap was placed, the patient did not change the dressing on his thigh at all and so the foam was saturated and his skin is macerated. There is a light layer of slough on the wound surface. The underlying granulation tissue is robust and healthy-appearing. He has heavy callus buildup at the site of his first metatarsal head wound which is still healed. 02/01/2022: He has been in silver alginate. When he removed the dressing from his thigh wound, however, some leg, superficially reopening a portion of the wound that had healed. In addition, underneath the callus at his left first metatarsal head, there  appears to be a blister and the wound appears to be open again. 02/08/2022: The lateral leg wound has contracted substantially. There is eschar and a light layer of slough present. He says that it is starting to pull and is uncomfortable. On inspection, there is some puckering of the scar and the eschar is quite dry; this may account for his symptoms. On his first metatarsal head, the wound is much smaller with just some eschar on the surface. The callus has not reaccumulated. He reports that he had a blister come up on his medial thigh wound at the distal aspect. It popped and there is now an opening in his skin again. Looking back through his [place] of wound photos, there is what looks like a permanent suture just deep to this location and it may be trying to erode through. We have been using silver alginate on his wounds. 02/15/2022: The lateral leg wound is about half the size it was last week. It is clean with just a little perimeter eschar and light slough. The wound on his first metatarsal head is about the same with heavy callus overlying it. The medial thigh wound is closed again. He does have some skin changes on the top of his foot that looks potentially yeast related. 02/22/2022: The skin on the top of his foot improved with the use of a topical antifungal. The lateral leg wound continues to contract and is again smaller this week. There is a little bit of slough and eschar on the surface. The first metatarsal head wound is a little bit smaller but has reaccumulated a thick callus over the top. He decided to try to trim his toenail and ultimately took the entire nail off of his left great toe. Electronic Signature(s) Signed: 02/22/2022 11:32:59 AM By: [name] Maudlin MD FACS Entered By: [name] on 02/22/2022 11:32:59 -------------------------------------------------------------------------------- Physical Exam Details Patient Name:  Date of Service: LAUREANO, HETZER 02/22/2022  10:30 A M Medical Record Number: [id number] Patient Account Number: [id number] Date of Birth/Sex: Treating RN: [birth year] (71 y.o. [name] Primary Care Provider: Jilda [name] Other Clinician: Referring Provider: Treating Provider/Extender: [name] in Treatment: 45 Constitutional . . . . No acute distress.Marland Kitchen Respiratory Normal work of breathing on room air.. Notes 02/22/2022: The skin on the top of his foot improved with the use of a topical antifungal. The lateral leg wound continues to contract and is again smaller this week. There is a little bit of slough and eschar on the surface. The first metatarsal head wound is a little bit smaller but has reaccumulated a thick callus over the top. He decided to try to trim his toenail and ultimately took the entire nail off of his left great toe. Electronic Signature(s) Signed: 02/22/2022 11:33:31 AM By: [name] Maudlin MD FACS Entered By: [name] on 02/22/2022 11:33:31 -------------------------------------------------------------------------------- Physician Orders Details Patient Name: Date of Service: [name]. 02/22/2022 10:30 A M Medical Record Number: [id number] Patient Account Number: [id number] Date of Birth/Sex: Treating RN: 01/30/1951 (71 y.o. [name] Primary Care Provider: Jilda [name] Other Clinician: Referring Provider: Treating Provider/Extender: [name] in Treatment: 19 Verbal / Phone Orders: No Diagnosis Coding ICD-10 Coding Code Description 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 I89.0 Lymphedema, not elsewhere classified I87.322 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verity 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 of other part of left foot with other specified  severity Follow-up Appointments ppointment in 1 week. - Dr Celine Ahr - Room 4 Return A Friday 9/8 @ 08:45 am ppointment in 2 weeks. - Thurs 9/14 @ 08:15 am RM 1 Return A Anesthetic Wound #22 Left,Lateral Lower Leg (In clinic) Topical Lidocaine 4% applied to wound bed Wound #27 Left,Medial Foot (In clinic) Topical Lidocaine 4% applied to wound bed Bathing/ Shower/ Hygiene May shower with protection but do not get wound dressing(s) wet. - Use a cast protector so you can shower without getting your wrap(s) wet Edema Control - Lymphedema / SCD / Other Elevate legs to the level of the heart or above for 30 minutes daily and/or when sitting, a frequency of: - throughout the day Avoid standing for long periods of time. Patient to wear own compression stockings every day. - on right leg; Moisturize legs daily. - Ammonium lactate to right leg daily Wound Treatment Wound #22 - Lower Leg Wound Laterality: Left, Lateral Cleanser: Soap and Water 3 x Per Week/30 Days Discharge Instructions: May shower and wash wound with dial antibacterial soap and water prior to dressing change. Cleanser: Wound Cleanser 3 x Per Week/30 Days Discharge Instructions: Cleanse the wound with wound cleanser prior to applying a clean dressing using gauze sponges, not tissue or cotton balls. Peri-Wound Care: Triamcinolone 15 (g) 3 x Per Week/30 Days Discharge Instructions: Use triamcinolone 15 (g) as directed Peri-Wound Care: Sween Lotion (Moisturizing lotion) 3 x Per Week/30 Days Discharge Instructions: Apply moisturizing lotion to the leg Prim Dressing: KerraCel Ag Gelling Fiber Dressing, 4x5 in (silver alginate) 3 x Per Week/30 Days ary Discharge Instructions: Apply silver alginate to wound bed as instructed Secondary Dressing: ABD Pad, 8x10 (Generic) 3 x Per Week/30 Days Discharge Instructions: Apply over primary dressing as directed. Secured With: Elastic Bandage 4 inch (ACE bandage) 3 x Per  Week/30 Days Discharge  Instructions: Secure with ACE bandage as directed. Compression Wrap: CoFlex TLC XL 2-layer Compression System 4x7 (in/yd) 3 x Per Week/30 Days Discharge Instructions: Apply CoFlex 2-layer compression as directed. (alt for 4 layer) Wound #27 - Foot Wound Laterality: Left, Medial Peri-Wound Care: Sween Lotion (Moisturizing lotion) 1 x Per Week Discharge Instructions: Apply moisturizing lotion as directed Topical: Ketoconazole Cream 2% 1 x Per Week Discharge Instructions: Apply Ketoconazole to top of foot Prim Dressing: KerraCel Ag Gelling Fiber Dressing, 2x2 in (silver alginate) 1 x Per Week ary Discharge Instructions: Apply silver alginate to wound bed as instructed Secondary Dressing: Optifoam Non-Adhesive Dressing, 4x4 in 1 x Per Week Discharge Instructions: Apply over primary dressing cut to make foam donut Secondary Dressing: Woven Gauze Sponges 2x2 in 1 x Per Week Discharge Instructions: Apply over primary dressing as directed. Secured With: 36M Medipore H Soft Cloth Surgical T ape, 4 x 10 (in/yd) 1 x Per Week Discharge Instructions: Secure with tape as directed. Compression Wrap: CoFlex TLC XL 2-layer Compression System 4x7 (in/yd) 1 x Per Week Discharge Instructions: Apply CoFlex 2-layer compression as directed. (alt for 4 layer) Electronic Signature(s) Signed: 02/22/2022 12:18:52 PM By: [name] Maudlin MD FACS Entered By: [name] on 02/22/2022 11:33:52 -------------------------------------------------------------------------------- Problem List Details Patient Name: Date of Service: [name]. 02/22/2022 10:30 A M Medical Record Number: [id number] Patient Account Number: [id number] Date of Birth/Sex: Treating RN: June 14, 1951 (71 y.o. [name] Primary Care Provider: Jilda [name] Other Clinician: Referring Provider: Treating Provider/Extender: [name] in Treatment: 45 Active Problems ICD-10 Encounter Code Description Active  Date MDM Diagnosis 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 04/12/2021 No Yes I89.0 Lymphedema, not elsewhere classified 04/12/2021 No Yes I87.322 Chronic venous hypertension (idiopathic) with inflammation of left lower 04/12/2021 No Yes extremity 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 ulcer of other part of left lower leg with other specified 04/12/2021 No Yes severity 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 of other part of left foot with other specified 04/12/2021 No Yes severity Inactive Problems ICD-10 Code Description Active Date Inactive Date E11.621 Type 2 diabetes mellitus with foot ulcer 04/12/2021 04/12/2021 E11.42 Type 2 diabetes mellitus with diabetic polyneuropathy 04/12/2021 04/12/2021 L02.416 Cutaneous abscess of left lower limb 06/13/2021 06/13/2021 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 02/22/2022 10:30 A M Medical Record Number: [id number] Patient Account Number: [id number] Date of Birth/Sex: Treating RN: Jun 25, 1951 (71 y.o. [name] Primary Care Provider: Jilda [name] Other Clinician: Referring Provider: Treating Provider/Extender: [name] in Treatment: 71 Subjective Chief Complaint Information obtained from Patient Left leg and foot ulcers 04/12/2021; patient is here for wounds on his left lower leg and left plantar foot over the first metatarsal head History of Present Illness (HPI) 10/11/17; Mr. Toruno is a 71 year old man who tells me that in 2015 he slipped down the latter traumatizing his left leg. He developed a wound in the same  spot the area that we are currently looking at. He states this closed over for the most part although he always felt it was somewhat unstable. In 2016 he hit the same area with the door of his car had this reopened. He tells me that this is never really closed although sometimes an inflow  it remains open on a constant basis. He has not been using any specific dressing to this except for topical antibiotics the nature of which were not really sure. His primary doctor did send him to see Dr. Einar [name] of interventional cardiology. He underwent an angiogram on 08/06/17 and he underwent a PTA and directional atherectomy of the lesser distal SFA and popliteal arteries which resulted in brisk improvement in blood flow. It was noted that he had 2 vessel runoff through the anterior tibial and peroneal. He is also been to see vascular and interventional radiologist. He was not felt to have any significant superficial venous insufficiency. Presumably is not a candidate for any ablation. It was suggested he come here for wound care. The patient is a type II diabetic on insulin. He also has a history of venous insufficiency. ABIs on the left were noncompressible in our clinic 10/21/17; patient we admitted to the clinic last week. 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 We put Iodosorb on him after an aggressive debridement and 3 layer compression. He complained of pain in his ankle and itching with is skin in fact he scratched the area on the medial calf superiorly at the rim of our wraps and he has 2 small open areas in that location today which are new. I changed his primary dressing today to silver collagen. As noted he is already had revascularization and does not have any significant superficial venous insufficiency that would be amenable to ablation 10/28/17; patient admitted to the clinic 2 weeks ago. He has a smaller Wound. Scratch injury from  last week revealed. There is large wound over the tibial area. This is smaller. Granulation looks healthy. No need for debridement. 11/04/17; the wound on the left lateral calf looks better. Improved dimensions. Surface of this looks better. We've been maintaining him and Kerlix Coban wraps. He finds this much more comfortable. Silver collagen dressing 11/11/17; left lateral Wound continues to look healthy be making progress. Using a #5 curet I removed removed nonviable skin from the surface of the wound and then necrotic debris from the wound surface. Surface of the wound continues to look healthy. ooHe also has an open area on the left great toenail bed. We've been using topical antibiotics. 11/19/17; left anterior lateral wound continues to look healthy but it's not closed. ooHe also had a small wound above this on the left leg ooInitially traumatic wounds in the setting of significant chronic venous insufficiency and stasis dermatitis 11/25/17; left anterior wounds superiorly is closed still a small wound inferiorly. 12/02/17; left anterior tibial area. Arrives today with adherent callus. Post debridement clearly not completely closed. Hydrofera Blue under 3 layer compression. 12/09/17; left anterior tibia. Circumferential eschar however the wound bed looks stable to improved. We've been using Hydrofera Blue under 3 layer compression 12/17/17; left anterior tibia. Apparently this was felt to be closed however when the wrap was taken off there is a skin tear to reopen wounds in the same area we've been using Hydrofera Blue under 3 layer compression 12/23/17 left anterior tibia. Not close to close this week apparently the [facility] was stuck to this again. Still circumferential eschar requiring debridement. I put a contact layer on this this time under the Hydrofera Blue 12/31/17; left anterior tibia. Wound is better slight amount of hyper-granulation. Using Hydrofera Blue over Adaptic. 01/07/18;  left anterior tibia. The wound had some surface eschar however after this  was removed he has no open wound.he was already revascularized by Dr. Einar [name] when he came to our clinic with atherectomy of the left SFA and popliteal artery. He was also sent to interventional radiology for venous reflux studies. He was not felt to have significant reflux but certainly has chronic venous changes of his skin with hemosiderin deposition around this area. He will definitely need to lubricate his skin and wear compression stocking and I've talked to him about this. READMISSION 05/26/2018 This is a now 71 year old man we cared for with traumatic wounds on his left anterior lower extremity. He had been previously revascularized during that admission by Dr. Einar [name]. Apparently in follow-up Dr. Einar [name] noted that he had deterioration in his arterial status. He underwent a stent placement in the distal left SFA on 04/22/2018. 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 He went back to the angiography suite on 04/30/2018 he underwent PTA and balloon angioplasty of the occluded left mid anterior tibial artery, thrombotic occlusion went from 100 to 0% which reconstitutes the posterior tibial artery. He had thrombectomy and aspiration of the peroneal artery. The stent placed in the distal SFA left SFA was still occluded. 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 He has been recuperating at [place] home in [facility] after the most recent hospitalization. He is going home tomorrow. He tells me that 2 weeks ago he traumatized the tip of his left fifth toe. He came in urgently for our review of this. This was a history of before I noted that Dr. Einar [name] had already  noted dry gangrenous changes of the left fifth toe 06/09/2018; 2-week follow-up. I did contact Dr. Einar [name] after his last appointment and he apparently saw 1 of Dr. Irven Shelling colleagues the next day. He does not follow-up with Dr. Einar [name] himself until Thursday of this week. 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 Nevertheless there is no evidence of infection no drainage and no pain. He had a new area that this week when we were signing him in today on the left anterior mid tibia area, this is in close proximity to the previous wound we have dealt with in this clinic. 06/23/2018; 2-week follow-up. I did not receive a recent note from Dr. Einar [name] to review today. Our office is trying to obtain this. He is apparently not planning to do further vascular interventions and wondered about compression to try and help with the patient's chronic venous insufficiency. However we are also concerned about the arterial flow. ooHe arrives in clinic today with a new area on the left third toe. The areas on the calf/anterior tibia are close to closing. The left fifth toe is still mummified using Betadine. -In reviewing things with the patient he has what sounds like claudication with mild to moderate amount of activity. 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 I prescribed Levaquin over the phone while we attempted to arrange a plan of care. However the patient called yesterday to report he had low-grade fever and he came in today acutely. There is been a marked deterioration in the left third toe with spreading cellulitis up into the dorsal left foot. He was referred to  the emergency room. Readmission: 06/29/2020 patient presents today for reevaluation here in our clinic he was previously treated by Dr. Dellia Nims at the latter part of 2019 in 2 the beginning of 2020. Subsequently we have not seen him since that time in the interim he did have evaluation with vein and  vascular specialist specifically Dr. Anice Paganini who did perform quite extensive work for a left femoral to anterior tibial artery bypass. With that being said in the interim the patient has developed significant lymphedema and has wounds that he tells me have really never healed in regard to the incision site on the left leg. He also has multiple wounds on the feet for various reasons some of which is that he tends to pick at his feet. Fortunately there is no signs of active infection systemically at this time he does have some wounds that are little bit deeper but most are fairly superficial he seems to have good blood flow and overall everything appears to be healthy I see no bone exposed and no obvious signs of osteomyelitis. I do not know that he necessarily needs a x-ray at this point although that something we could consider depending on how things progress. The patient does have a history of lymphedema, diabetes, this is type II, chronic kidney disease stage III, hypertension, and history of peripheral vascular disease. 07/05/2020; patient admitted last week. Is a patient I remember from 2019 he had a spreading infection involving the left foot and we sent him to the hospital. 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 He subsequently had a left femoral to anterior tibial bypass by Dr.Cain vein and vascular. He also has severe lymphedema with chronic skin changes related to that on the left leg. The most problematic area that was new today was on the left medial great toe. This was apparently a small area last week there was purulent drainage which our intake nurse cultured. Also areas on the left medial foot and heel left lateral foot. He has 2 areas on the left medial calf left lateral calf in the setting of the severe lymphedema. 07/13/2020 on evaluation today patient appears to be doing better in my opinion compared to his last visit. The good news is  there is no signs of active infection systemically and locally I do not see any signs of infection either. He did have an x-ray which was negative that is great news he had a culture which showed MRSA but at the same time he is been on the doxycycline which has helped. I do think we may want to extend this for 7 additional days 1/25; patient admitted to the clinic a few weeks ago. 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 We have been putting him under compression his edema control is a lot better but he is severe verricused skin on the left leg. He is really done quite well he still has an open area on the left medial calf and the left medial first metatarsal head. We have been using silver collagen on the leg silver alginate on the foot 07/27/2020 upon evaluation today patient appears to be doing decently well in regard to his wounds. He still has a lot of dry skin on the left leg. Some of this is starting to peel back and I think he may be able to have them out by removing some that today. Fortunately there is no  signs of active infection at this time on the left leg although on the right leg he does appear to have swelling and erythema as well as some mild warmth to touch. This does have been concerned about the possibility of cellulitis although within the differential diagnosis I do think that potentially a DVT has to be at least considered. We need to rule that out before proceeding would just call in the cellulitis. Especially since he is having pain in the posterior aspect of his calf muscle. 2/8; the patient had seen sparingly. He has severe skin changes of chronic lymphedema in the left leg thickened hyperkeratotic verrucous skin. He has an open wound on the medial part of the left first met head left mid tibia. He also has a rim of nonepithelialized skin in the anterior mid tibia. He brought in the AmLactin lotion that was been prescribed although  I am not sure under compression and its utility. There concern about cellulitis on the right lower leg the last time he was here. He was put on on antibiotics. His DVT rule out was negative. The right leg looks fine he is using his stocking on this area 08/10/2020 upon evaluation today patient appears to be doing well with regard to his leg currently. He has been tolerating the dressing changes without complication. Fortunately there is no signs of active infection which is great news. Overall very pleased with where things stand. 2/22; the patient still has an area on the medial part of the left first met his head. This looks better than when I last saw this earlier this month he has a rim of epithelialization but still some surface debris. Mostly everything on the left leg is healed. There is still a vulnerable in the left mid tibia area. 08/30/2020 upon evaluation today patient appears to be doing much better in regard to his wounds on his foot. 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 Nonetheless I think we will address that today I Minna send in a prescription for him in that regard. Overall though there does not appear to be any signs of significant worsening. 09/07/2020 on evaluation today patient's wounds over his left foot appear to be doing excellent. I do not see any signs of infection there is some callus buildup this can require debridement for certain but overall I feel like he is managing quite nicely. He still using the AmLactin cream which has been beneficial for him as well. 3/22; left foot wound is closed. There is no open area here. He is using ammonium lactate lotion to the lower extremities to help exfoliate dry cracked skin. He has compression stockings from elastic therapy in [place]. The wound on the medial part of his left first met head is  healed today. READMISSION 04/12/2021 Mr. Urquidi is a patient we know fairly well he had a prolonged stay in clinic in 2019 with wounds on his left lateral and left anterior lower extremity in the setting of chronic venous insufficiency. More recently he was here earlier this year with predominantly an area on his left foot first metatarsal head plantar and he says the plantar foot broke down on its not long after we discharged him but he did not come back here. The last few months areas of broken down on his left anterior and again the left lateral lower extremity. The  leg itself is very swollen chronically enlarged a lot of hyperkeratotic dry Berry Q skin in the left lower leg. His edema extends well into the thigh. He was seen by Dr. Donzetta Matters. He had ABIs on 03/02/2021 showing an ABI on the right of 1 with a TBI of 0.72 his ABI in the left at 1.09 TBI of 0.99. Monophasic and biphasic waveforms on the right. On the left monophasic waveforms were noted he went on to have an angiogram on 03/27/2021 this showed the aortic aortic and iliac segments were free of flow-limiting stenosis the left common femoral vein to evaluate the left femoral to anterior tibial artery bypass was unobstructed the bypass was patent without any areas of stenosis. We discharged the patient in bilateral juxta lite stockings but very clearly that was not sufficient to control the swelling and maintain skin integrity. He is clearly going to need compression pumps. 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 This is fortunate because he is on his feet for long periods of time. 10/27; patient comes in with our intake nurse reporting copious amount of green drainage from the left anterior mid tibia the left dorsal foot and to a lesser extent the left medial mid tibia. We left the compression wrap on all week for the amount of edema in his left leg is quite a bit better. We use silver  alginate as the primary dressing 11/3; edema control is good. Left anterior lower leg left medial lower leg and the plantar first metatarsal head. The left anterior lower leg required debridement. Deep tissue culture I did of this wound showed MRSA I put him on 10 days of doxycycline which she will start today. We have him in compression wraps. He has a security card and [employer] however he is retiring on November 15. We will need to then get him into a better offloading boot for the left foot perhaps a total contact cast 11/10; edema control is quite good. Left anterior and left medial lower leg wounds in the setting of chronic venous insufficiency and lymphedema. He also has a substantial area over the left plantar first metatarsal head. 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 He has significant hypergranulation on the left plantar foot wound. The patient is a diabetic but he does not have significant PAD 11/17; edema control is quite good. Left anterior and left medial lower leg wounds look better. The really concerning area remains the area on the left plantar first metatarsal head. He has a rim of epithelialization. 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 Starting today with a forefoot off loader but . possibly a total contact cast. 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 Arrives with a very significant odor from I think all of the wounds on the left leg and the left foot. Because of this I did not put a total contact cast on him today but will could still consider this. His wife was having cataract surgery which is the reason he missed the appointment 12/6. I saw this man 66  days ago with a swelling below the popliteal fossa. I thought he actually might have a Baker's cyst however the DVT rule out study that we could arrange right away was negative the technician told me this was not a ruptured Baker's cyst. We attempted to get this aspirated by under ultrasound guidance in interventional radiology however all they did was an ultrasound however it shows an extensive fluid collection 62 x 8 x 9.4 in the left thigh and left calf. The patient states he thinks this started 8 days ago or so but he really is not complaining of any pain, fever or systemic symptoms. He has not ha 12/20; after some difficulty I managed to get the patient into see Dr. Donzetta Matters. Eventually he was taken into the hospital and had a drain put in the fluid collection below his left knee posteriorly extending into the posterior thigh. He still has the drain in place. Culture of this showed moderate staff aureus few Morganella and few Klebsiella he is now on doxycycline and ciprofloxacin as suggested by infectious disease he is on this for a month. The drain will remain in place until it stops draining 12/29; he comes in today with the 1 wound on his left leg and the area on the left plantar first met head significantly smaller. Both look healthy. He still has the drain in the left leg. He says he has to change this daily. Follows up with Dr. Donzetta Matters on January 11. 06/29/2021; the wounds that I am following on the left leg and left first met head continued to be quite healthy. However the area where his inferior drain is in place had copious amounts of drainage which was green in color. The wound here is larger. Follows up with Dr. Gwenlyn Saran of vein and vascular his surgeon next week as well as infectious disease. He remains on ciprofloxacin and doxycycline. He is not complaining of excessive pain in either one of the drain areas 1/12; the patient saw vascular surgery and infectious disease. Vascular surgery has left the  drain in place as there was still some notable drainage still see him back in 2 weeks. Dr. Velna Ochs stop the doxycycline and ciprofloxacin and I do not believe he follows up with them at this point. Culture I did last week showed both doxycycline resistant MRSA and Pseudomonas not sensitive to ciprofloxacin although only in rare titers 1/19; the patient's wound on the left anterior lower leg is just about healed. We have continued healing of the area that was medially on the left leg. Left first plantar metatarsal head continues to get smaller. The major problem here is his 2 drain sites 1 on the left upper calf and lateral thigh. There is purulent drainage still from the left lateral thigh. I gave him antibiotics last week but we still have recultured. He has the drain in the area I think this is eventually going to have to come out. I suspect there will be a connecting wound to heal here perhaps with improved VAc 1/26; the patient had his drain removed by vein and vascular on 1/25/. This was a large pocket of fluid in his left thigh that seem to tunnel into his left upper calf. 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 His [name] his Penrose drain was removed today. He now has a tunneling wound on his left calf and left thigh. Both of these probe widely towards each other although I cannot really prove that they connect. Both  wounds on his lower leg anteriorly are closed and his area over the first metatarsal head on his right foot continues to improve. We are using Hydrofera Blue here. He also saw infectious disease culture of the abscess they noted was polymicrobial with MRSA, Morganella and Klebsiella he was treated with doxycycline and ciprofloxacin for 4 weeks ending on 07/03/2021. They did not recommend any further antibiotics. Notable that while he still had the Penrose drain in place last week he had purulent drainage coming out of the inferior IandD site this grew [place]  ER, MRSA and Pseudomonas but there does not appear to be any active infection in this area today with the drain out and he is not systemically unwell 2/2; with regards to the drain sites the superior one on the thigh actually is closed down the one on the upper left lateral calf measures about 8 and half centimeters which is an improvement seems to be less prominent although still with a lot of drainage. The only remaining wound is over the first metatarsal head on the left foot and this looks to be continuing to improve with Hydrofera Blue. 2/9; the area on his plantar left foot continues to contract. Callus around the wound edge. The drain sites specifically have not come down in depth. We put the wound VAC on Monday he changed the canister late last night our intake nurse reported a pocket of fluid perhaps caused by our compression wraps 2/16; continued improvement in left foot plantar wound. drainage site in the calf is not improved in terms of depth (wound vac) 2/23; continued improvement in the left foot wound over the first metatarsal head. With regards to the drain sites the area on his thigh laterally is healed however the open area on his calf is small in terms of circumference by still probes in by about 15 cm. Within using the wound VAC. Hydrofera Blue on his foot 08/24/2021: The left first metatarsal head wound continues to improve. The wound bed is healthy with just some surrounding callus. Unfortunately the open drain site on his calf remains open and tunnels at least 15 cm (the extent of a Q-tip). This is despite several weeks of wound VAC treatment. Based on reading back through the notes, there has been really no significant change in the depth of the wound, although the orifice is smaller and the more cranial wound on his thigh has closed. I suspect the tunnel tracks nearly all the way to this location. 08/31/2021: Continued improvement in the left first metatarsal head wound. There  has been absolutely no improvement to the long tunnel from his open drain site on his calf. We have tried to get him into see vascular surgery sooner to consider the possibility of simply filleting the tract open and allowing it to heal from the bottom up, likely with a wound VAC. They have not yet scheduled a sooner appointment than his current mid April 09/14/2021: He was seen by vascular surgery and they took him to the operating room last week. They opened a portion of the tunnel, but did not extend the entire length of the known open subcutaneous tract. I read Dr. Claretha Cooper operative note and it is not clear from that documentation why only a portion of the tract was opened. The heaped up granulation tissue was curetted and removed from at least some portion of the tract. They did place a wound VAC and applied an Unna boot to the leg. The ulcer on his left first  metatarsal head is smaller today. The bed looks good and there is just a small amount of surrounding callus. 09/21/2021: The ulcer on his left first metatarsal head looks to be stalled. There is some callus surrounding the wound but the wound bed itself does not appear particularly dynamic. The tunnel tract on his lateral left leg seems to be roughly the same length or perhaps slightly smaller but the wound bed appears healthy with good granulation tissue. He opened up a new wound on his medial thigh and the site of a prior surgical incision. He says that he did this unconsciously in his sleep by scratching. 09/28/2021: Unfortunately, the ulcer on his left first metatarsal head has extended underneath the callus toward the dorsum of his foot. The medial thigh wounds are roughly the same. The tunnel on his lateral left leg continues to be problematic; it is longer than we are able to actually probe with a Q-tip. I am still not certain as to why Dr. Donzetta Matters did not open this up entirely when he took the patient to the operating room. We will likely be  back in the same situation with just a small superficial opening in a long unhealed tract, as the open portion is granulating in nicely. 10/02/2021: The patient was initially scheduled for a nurse visit, but we are also applying a total contact cast today. The plantar foot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

## 2022-02-22 NOTE — Progress Notes (Signed)
JOTHAM, AHN ([id number]) Visit Report for 02/22/2022 Arrival Information Details Patient Name: Date of Service: [name], [name] 02/22/2022 10:30 A M Medical Record Number: [id number] Patient Account Number: [id number] Date of Birth/Sex: Treating RN: 06/07/1951 (71 y.o. [name] Primary Care Albie Bazin: [name] Other Clinician: Referring Royal Vandevoort: Treating Djuna Frechette/Extender: [name] in Treatment: 74 Visit Information History Since Last Visit Added or deleted any medications: No Patient Arrived: Ambulatory Any new allergies or adverse reactions: No Arrival Time: 10:43 Had a fall or experienced change in No Accompanied By: self activities of daily living that may affect Transfer Assistance: None risk of falls: Patient Identification Verified: Yes Signs or symptoms of abuse/neglect since last visito No Secondary Verification Process Completed: Yes Hospitalized since last visit: No Patient Requires Transmission-Based Precautions: No Implantable device outside of the clinic excluding No Patient Has Alerts: Yes cellular tissue based products placed in the center since last visit: Has Compression in Place as Prescribed: Yes Pain Present Now: No Electronic Signature(s) Signed: 02/22/2022 4:12:33 PM By: Erenest Blank Entered By: Erenest Blank on 02/22/2022 10:43:35 -------------------------------------------------------------------------------- Lower Extremity Assessment Details Patient Name: Date of Service: [name]. 02/22/2022 10:30 A M Medical Record Number: [id number] Patient Account Number: [id number] Date of Birth/Sex: Treating RN: 12/06/1950 (71 y.o. [name] Primary Care Claudell Wohler: [name] Other Clinician: Referring Kayah Hecker: Treating Shekia Kuper/Extender: [name] in Treatment: 45 Edema Assessment Assessed: [name]: No] [name]: No] Edema: [Left: Ye] [Right: s] Calf Left:  Right: Point of Measurement: 41 cm From Medial Instep 45 cm Ankle Left: Right: Point of Measurement: 10 cm From Medial Instep 29 cm Electronic Signature(s) Signed: 02/22/2022 4:12:33 PM By: Erenest Blank Signed: 02/22/2022 6:25:40 PM By: Baruch Gouty RN, BSN Entered By: Erenest Blank on 02/22/2022 10:53:58 -------------------------------------------------------------------------------- Multi Wound Chart Details Patient Name: Date of Service: [name]. 02/22/2022 10:30 A M Medical Record Number: [id number] Patient Account Number: [id number] Date of Birth/Sex: Treating RN: Oct 31, 1950 (71 y.o. [name] Primary Care Mando Blatz: [name] Other Clinician: Referring Zareth Rippetoe: Treating Caelin Rayl/Extender: [name] in Treatment: 31 Vital Signs Height(in): 74 Capillary Blood Glucose(mg/dl): 227 Weight(lbs): 238 Pulse(bpm): 68 Body Mass Index(BMI): 30.6 Blood Pressure(mmHg): 125/84 Temperature(F): 98.1 Respiratory Rate(breaths/min): 18 Photos: [N/A:N/A] Left, Lateral Lower Leg Left, Medial Foot N/A Wound Location: Bump Blister N/A Wounding Event: Cyst Diabetic Wound/Ulcer of the Lower N/A Primary Etiology: Extremity Glaucoma, Sleep Apnea, Glaucoma, Sleep Apnea, N/A Comorbid History: Hypertension, Peripheral Arterial Hypertension, Peripheral Arterial Disease, Peripheral Venous Disease, Disease, Peripheral Venous Disease, Type II Diabetes, Gout, Osteoarthritis, Type II Diabetes, Gout, Osteoarthritis, Neuropathy Neuropathy 06/03/2021 02/01/2022 N/A Date Acquired: 37 3 N/A Weeks of Treatment: Open Open N/A Wound Status: No No N/A Wound Recurrence: Yes No N/A Clustered Wound: 3 N/A N/A Clustered Quantity: 12x1.2x0.1 0.1x0.1x0.1 N/A Measurements L x W x D (cm) 11.31 0.008 N/A A (cm) : rea 1.131 0.001 N/A Volume (cm) : -585.90% 99.20% N/A % Reduction in A rea: 14.30% 99.00% N/A % Reduction in Volume: Full Thickness  With Exposed Support Grade 1 N/A Classification: Structures Medium Medium N/A Exudate A mount: Serosanguineous Serosanguineous N/A Exudate Type: red, brown red, brown N/A Exudate Color: Fibrotic scar, thickened scar Distinct, outline attached N/A Wound Margin: Large (67-100%) Large (67-100%) N/A Granulation A mount: Red Red N/A Granulation Quality: Small (1-33%) None Present (0%) N/A Necrotic A mount: Fat Layer (Subcutaneous Tissue): Yes Fat Layer (Subcutaneous Tissue): Yes N/A Exposed Structures: Fascia: No Fascia: No Tendon: No  Tendon: No Muscle: No Muscle: No Joint: No Joint: No Bone: No Bone: No Medium (34-66%) Medium (34-66%) N/A Epithelialization: Debridement - Selective/Open Wound Debridement - Selective/Open Wound N/A Debridement: Pre-procedure Verification/Time Out 11:20 11:20 N/A Taken: Lidocaine 4% Topical Solution Lidocaine 4% T opical Solution N/A Pain Control: [employer], Slough N/A Tissue Debrided: Non-Viable Tissue Non-Viable Tissue N/A Level: 8.4 0.25 N/A Debridement A (sq cm): rea Curette Curette N/A Instrument: Minimum Minimum N/A Bleeding: Pressure Pressure N/A Hemostasis Achieved: 0 0 N/A Procedural Pain: 0 0 N/A Post Procedural Pain: Procedure was tolerated well Procedure was tolerated well N/A Debridement Treatment Response: 12x1.2x0.1 0.3x0.3x0.1 N/A Post Debridement Measurements L x W x D (cm) 1.131 0.007 N/A Post Debridement Volume: (cm) Debridement Debridement N/A Procedures Performed: Treatment Notes Electronic Signature(s) Signed: 02/22/2022 11:29:38 AM By: Fredirick Maudlin MD FACS Signed: 02/22/2022 6:25:40 PM By: Baruch Gouty RN, BSN Entered By: Fredirick Maudlin on 02/22/2022 11:29:37 -------------------------------------------------------------------------------- Multi-Disciplinary Care Plan Details Patient Name: Date of Service: [name]. 02/22/2022 10:30 A M Medical Record Number: [id number] Patient  Account Number: [id number] Date of Birth/Sex: Treating RN: [birth year] (71 y.o. [name] Primary Care Makylee Sanborn: [name] Other Clinician: Referring Nyree Applegate: Treating Arma Reining/Extender: [name] in Treatment: 75 Multidisciplinary Care Plan reviewed with physician Active Inactive Venous Leg Ulcer Nursing Diagnoses: Knowledge deficit related to disease process and management Potential for venous Insuffiency (use before diagnosis confirmed) Goals: Patient will maintain optimal edema control Date Initiated: 07/27/2021 Target Resolution Date: 03/15/2022 Goal Status: Active Interventions: Assess peripheral edema status every visit. Treatment Activities: Therapeutic compression applied : 07/27/2021 Notes: Wound/Skin Impairment Nursing Diagnoses: Impaired tissue integrity Knowledge deficit related to ulceration/compromised skin integrity Goals: Patient will have a decrease in wound volume by X% from date: (specify in notes) Date Initiated: 04/12/2021 Date Inactivated: 01/04/2022 Target Resolution Date: 04/23/2021 Goal Status: Met Patient/caregiver will verbalize understanding of skin care regimen Date Initiated: 01/04/2022 Target Resolution Date: 03/01/2022 Goal Status: Active Ulcer/skin breakdown will have a volume reduction of 30% by week 4 Date Initiated: 04/12/2021 Date Inactivated: 04/27/2021 Target Resolution Date: 04/27/2021 Goal Status: Unmet Unmet Reason: infection Ulcer/skin breakdown will have a volume reduction of 50% by week 8 Date Initiated: 04/27/2021 Date Inactivated: 06/29/2021 Target Resolution Date: 06/24/2021 Goal Status: Met Interventions: Assess patient/caregiver ability to obtain necessary supplies Assess patient/caregiver ability to perform ulcer/skin care regimen upon admission and as needed Assess ulceration(s) every visit Notes: Electronic Signature(s) Signed: 02/22/2022 6:25:40 PM By: Baruch Gouty RN, BSN Entered  By: Baruch Gouty on 02/22/2022 18:24:35 -------------------------------------------------------------------------------- Pain Assessment Details Patient Name: Date of Service: [name]. 02/22/2022 10:30 A M Medical Record Number: [id number] Patient Account Number: [id number] Date of Birth/Sex: Treating RN: 10/29/1950 (71 y.o. [name] Primary Care Ashiya Kinkead: [name] Other Clinician: Referring Danaya Geddis: Treating Deyci Gesell/Extender: [name] in Treatment: 45 Active Problems Location of Pain Severity and Description of Pain Patient Has Paino No Site Locations Pain Management and Medication Current Pain Management: Electronic Signature(s) Signed: 02/22/2022 4:12:33 PM By: Erenest Blank Signed: 02/22/2022 6:25:40 PM By: Baruch Gouty RN, BSN Entered By: Erenest Blank on 02/22/2022 10:44:26 -------------------------------------------------------------------------------- Patient/Caregiver Education Details Patient Name: Date of Service: [name] 8/31/2023andnbsp10:30 A M Medical Record Number: [id number] Patient Account Number: [id number] Date of Birth/Gender: Treating RN: 07-16-1950 (71 y.o. [name] Primary Care Physician: [name] Other Clinician: Referring Physician: Treating Physician/Extender: [name] in Treatment: 87 Education Assessment Education Provided To: Patient Education Topics  Provided Wound/Skin Impairment: Methods: Explain/Verbal Responses: Reinforcements needed, State content correctly Electronic Signature(s) Signed: 02/22/2022 6:25:40 PM By: Baruch Gouty RN, BSN Entered By: Baruch Gouty on 02/22/2022 18:24:54 -------------------------------------------------------------------------------- Wound Assessment Details Patient Name: Date of Service: [name]. 02/22/2022 10:30 A M Medical Record Number: [id number] Patient Account Number:  [id number] Date of Birth/Sex: Treating RN: May 11, 1951 (71 y.o. [name] Primary Care Rusty Villella: [name] Other Clinician: Referring Shuree Brossart: Treating Halea Lieb/Extender: [name] in Treatment: 86 Wound Status Wound Number: 22 Primary Cyst Etiology: Wound Location: Left, Lateral Lower Leg Wound Open Wounding Event: Bump Status: Date Acquired: 06/03/2021 Comorbid Glaucoma, Sleep Apnea, Hypertension, Peripheral Arterial Disease, Weeks Of Treatment: 37 History: Peripheral Venous Disease, Type II Diabetes, Gout, Osteoarthritis, Clustered Wound: Yes Neuropathy Photos Wound Measurements Length: (cm) 12 Width: (cm) 1.2 Depth: (cm) 0.1 Clustered Quantity: 3 Area: (cm) 11.31 Volume: (cm) 1.131 % Reduction in Area: -585.9% % Reduction in Volume: 14.3% Epithelialization: Medium (34-66%) Tunneling: No Undermining: No Wound Description Classification: Full Thickness With Exposed Support Structures Wound Margin: Fibrotic scar, thickened scar Exudate Amount: Medium Exudate Type: Serosanguineous Exudate Color: red, brown Wound Bed Granulation Amount: Large (67-100%) Granulation Quality: Red Necrotic Amount: Small (1-33%) Necrotic Quality: Adherent Slough Foul Odor After Cleansing: No Slough/Fibrino Yes Exposed Structure Fascia Exposed: No Fat Layer (Subcutaneous Tissue) Exposed: Yes Tendon Exposed: No Muscle Exposed: No Joint Exposed: No Bone Exposed: No Electronic Signature(s) Signed: 02/22/2022 4:12:33 PM By: Erenest Blank Signed: 02/22/2022 6:25:40 PM By: Baruch Gouty RN, BSN Entered By: Erenest Blank on 02/22/2022 10:57:31 -------------------------------------------------------------------------------- Wound Assessment Details Patient Name: Date of Service: [name]. 02/22/2022 10:30 A M Medical Record Number: [id number] Patient Account Number: [id number] Date of Birth/Sex: Treating RN: August 20, 1950 (71 y.o. [name] Primary Care Moustafa Mossa: [name] Other Clinician: Referring Demontez Novack: Treating Ameliana Brashear/Extender: [name] in Treatment: 43 Wound Status Wound Number: 27 Primary Diabetic Wound/Ulcer of the Lower Extremity Etiology: Wound Location: Left, Medial Foot Wound Open Wounding Event: Blister Status: Date Acquired: 02/01/2022 Comorbid Glaucoma, Sleep Apnea, Hypertension, Peripheral Arterial Disease, Weeks Of Treatment: 3 History: Peripheral Venous Disease, Type II Diabetes, Gout, Osteoarthritis, Clustered Wound: No Neuropathy Photos Wound Measurements Length: (cm) 0.1 Width: (cm) 0.1 Depth: (cm) 0.1 Area: (cm) 0.008 Volume: (cm) 0.001 % Reduction in Area: 99.2% % Reduction in Volume: 99% Epithelialization: Medium (34-66%) Tunneling: No Undermining: No Wound Description Classification: Grade 1 Wound Margin: Distinct, outline attached Exudate Amount: Medium Exudate Type: Serosanguineous Exudate Color: red, brown Foul Odor After Cleansing: No Slough/Fibrino No Wound Bed Granulation Amount: Large (67-100%) Exposed Structure Granulation Quality: Red Fascia Exposed: No Necrotic Amount: None Present (0%) Fat Layer (Subcutaneous Tissue) Exposed: Yes Tendon Exposed: No Muscle Exposed: No Joint Exposed: No Bone Exposed: No Electronic Signature(s) Signed: 02/22/2022 4:12:33 PM By: Erenest Blank Signed: 02/22/2022 6:25:40 PM By: Baruch Gouty RN, BSN Entered By: Erenest Blank on 02/22/2022 10:56:50 -------------------------------------------------------------------------------- Vitals Details Patient Name: Date of Service: [name]. 02/22/2022 10:30 A M Medical Record Number: [id number] Patient Account Number: [id number] Date of Birth/Sex: Treating RN: 12/10/1950 (71 y.o. [name] Primary Care Aniyiah Zell: [name] Other Clinician: Referring Lycia Sachdeva: Treating Tiffanee Mcnee/Extender: [name] in Treatment: 45 Vital Signs Time Taken: 10:43 Temperature (F): 98.1 Height (in): 74 Pulse (bpm): 81 Weight (lbs): 238 Respiratory Rate (breaths/min): 18 Body Mass Index (BMI): 30.6 Blood Pressure (mmHg): 125/84 Capillary Blood Glucose (mg/dl): 227 Reference Range: 80 - 120 mg / dl Electronic Signature(s)  Signed: 02/22/2022 4:12:33 PM By: Erenest Blank Entered By: Erenest Blank on 02/22/2022 10:44:17

## 2022-02-23 ENCOUNTER — Encounter: Payer: Self-pay | Admitting: Nurse Practitioner

## 2022-03-01 MED ORDER — INSULIN ASPART 100 UNIT/ML IJ SOLN: INTRAMUSCULAR | 3 refills | Status: DC

## 2022-03-02 ENCOUNTER — Encounter (HOSPITAL_BASED_OUTPATIENT_CLINIC_OR_DEPARTMENT_OTHER): Payer: Medicare Other | Attending: General Surgery | Admitting: General Surgery

## 2022-03-02 DIAGNOSIS — E1122 Type 2 diabetes mellitus with diabetic chronic kidney disease: Secondary | ICD-10-CM | POA: Insufficient documentation

## 2022-03-02 DIAGNOSIS — I129 Hypertensive chronic kidney disease with stage 1 through stage 4 chronic kidney disease, or unspecified chronic kidney disease: Secondary | ICD-10-CM | POA: Diagnosis not present

## 2022-03-02 DIAGNOSIS — L97528 Non-pressure chronic ulcer of other part of left foot with other specified severity: Secondary | ICD-10-CM | POA: Diagnosis not present

## 2022-03-02 DIAGNOSIS — Z794 Long term (current) use of insulin: Secondary | ICD-10-CM | POA: Diagnosis not present

## 2022-03-02 DIAGNOSIS — I872 Venous insufficiency (chronic) (peripheral): Secondary | ICD-10-CM | POA: Diagnosis not present

## 2022-03-02 DIAGNOSIS — N183 Chronic kidney disease, stage 3 unspecified: Secondary | ICD-10-CM | POA: Diagnosis not present

## 2022-03-02 DIAGNOSIS — I890 Lymphedema, not elsewhere classified: Secondary | ICD-10-CM | POA: Diagnosis not present

## 2022-03-02 DIAGNOSIS — E11621 Type 2 diabetes mellitus with foot ulcer: Secondary | ICD-10-CM | POA: Diagnosis present

## 2022-03-02 DIAGNOSIS — L97828 Non-pressure chronic ulcer of other part of left lower leg with other specified severity: Secondary | ICD-10-CM | POA: Insufficient documentation

## 2022-03-02 NOTE — Progress Notes (Signed)
[name], [name] ([id number]) Visit Report for 03/02/2022 Arrival Information Details Patient Name: Date of Service: [name], [name] 03/02/2022 8:45 A M Medical Record Number: [id number] Patient Account Number: [id number] Date of Birth/Sex: Treating RN: Mar 07, 1951 (71 y.o. [name] Primary Care [name]: [name] [name]: [name] [name]: [name] [name]: [name] in Treatment: 19 Visit Information History Since Last Visit All ordered tests and consults were completed: Yes Patient Arrived: Ambulatory Added or deleted any medications: No Arrival Time: 08:53 Any new allergies or adverse reactions: No Accompanied By: Self Had a fall or experienced change in No Transfer Assistance: None activities of daily living that may affect Patient Requires Transmission-Based Precautions: No risk of falls: Patient Has Alerts: Yes Signs or symptoms of abuse/neglect since last visito No Hospitalized since last visit: No Implantable device outside of the clinic excluding No cellular tissue based products placed in the center since last visit: Has Dressing in Place as Prescribed: Yes Has Compression in Place as Prescribed: Yes Pain Present Now: Yes Electronic Signature(s) Signed: 03/02/2022 6:17:39 PM By: [name] Catholic RN Entered By: [name] on 03/02/2022 08:57:36 -------------------------------------------------------------------------------- Encounter Discharge Information Details Patient Name: Date of Service: [name]. 03/02/2022 8:45 A M Medical Record Number: [id number] Patient Account Number: [id number] Date of Birth/Sex: Treating RN: 26-Jul-1950 (71 y.o. [name] Primary Care [name]: [name] [name]: [name] [name]: [name] [name]: [name] in Treatment: 31 Encounter Discharge Information Items Post Procedure Vitals Discharge Condition:  Stable Temperature (F): 98.7 Ambulatory Status: Ambulatory Pulse (bpm): 55 Discharge Destination: Home Respiratory Rate (breaths/min): 18 Transportation: Private Auto Blood Pressure (mmHg): 125/79 Accompanied By: self Schedule Follow-up Appointment: Yes Clinical Summary of Care: Electronic Signature(s) Signed: 03/02/2022 5:05:59 PM By: [name] East RN Entered By: [name] on 03/02/2022 12:33:46 -------------------------------------------------------------------------------- Lower Extremity Assessment Details Patient Name: Date of Service: [name], [name] 03/02/2022 8:45 A M Medical Record Number: [id number] Patient Account Number: [id number] Date of Birth/Sex: Treating RN: July 03, 1950 (71 y.o. [name] Primary Care [name]: [name] [name]: [name] [name]: [name] [name]: [name] in Treatment: 46 Edema Assessment Assessed: [name]: No] [name]: No] Edema: [Left: Ye] [Right: s] Calf Left: Right: Point of Measurement: 41 cm From Medial Instep 42.5 cm Ankle Left: Right: Point of Measurement: 10 cm From Medial Instep 28 cm Vascular Assessment Pulses: Dorsalis Pedis Palpable: [Left:Yes] Electronic Signature(s) Signed: 03/02/2022 6:17:39 PM By: [name] Catholic RN Entered By: [name] on 03/02/2022 09:07:51 -------------------------------------------------------------------------------- Multi Wound Chart Details Patient Name: Date of Service: [name]. 03/02/2022 8:45 A M Medical Record Number: [id number] Patient Account Number: [id number] Date of Birth/Sex: Treating RN: Nov 07, 1950 (71 y.o. [name] Primary Care [name]: [name] [name]: [name] [name]: [name] [name]: [name] in Treatment: 46 Vital Signs Height(in): 74 Capillary Blood Glucose(mg/dl): 105 Weight(lbs): 238 Pulse(bpm): 44 Body Mass Index(BMI): 30.6 Blood  Pressure(mmHg): 125/79 Temperature(F): 98.7 Respiratory Rate(breaths/min): 18 Photos: [22:Left, Lateral Lower Leg] [27:Left, Medial Foot] [N/A:N/A N/A] Wound Location: [22:Bump] [27:Blister] [N/A:N/A] Wounding Event: [22:Cyst] [27:Diabetic Wound/Ulcer of the Lower] [N/A:N/A] Primary Etiology: [22:Glaucoma, Sleep Apnea,] [27:Extremity Glaucoma, Sleep Apnea,] [N/A:N/A] Comorbid History: [22:Hypertension, Peripheral Arterial Disease, Peripheral Venous Disease, Disease, Peripheral Venous Disease, Type II Diabetes, Gout, Osteoarthritis, Type II Diabetes, Gout, Osteoarthritis, Neuropathy 06/03/2021] [27:Hypertension,  Peripheral Arterial Neuropathy 02/01/2022] [N/A:N/A] Date Acquired: [22:38] [27:4] [N/A:N/A] Weeks of Treatment: [22:Open] [27:Open] [N/A:N/A] Wound Status: [22:No] [27:No] [N/A:N/A] Wound Recurrence: [22:Yes] [27:No] [N/A:N/A]  Clustered Wound: [22:3] [27:N/A] [N/A:N/A] Clustered Quantity: [22:10x2x0.1] [27:4x4x0.3] [N/A:N/A] Measurements L x W x D (cm) [76:72.094] [27:12.566] [N/A:N/A] A (cm) : rea [22:1.571] [27:3.77] [N/A:N/A] Volume (cm) : [22:-852.60%] [27:-1130.80%] [N/A:N/A] % Reduction in A [22:rea: -19.10%] [27:-3596.10%] [N/A:N/A] % Reduction in Volume: [22:Full Thickness With Exposed Support Grade 1] [N/A:N/A] Classification: [22:Structures Medium] [27:Medium] [N/A:N/A] Exudate A mount: [22:Serosanguineous] [27:Serosanguineous] [N/A:N/A] Exudate Type: [22:red, brown] [27:red, brown] [N/A:N/A] Exudate Color: [22:Fibrotic scar, thickened scar] [27:Distinct, outline attached] [N/A:N/A] Wound Margin: [22:Large (67-100%)] [27:Small (1-33%)] [N/A:N/A] Granulation A mount: [22:Red, Pink] [27:Red, Pale] [N/A:N/A] Granulation Quality: [22:Small (1-33%)] [27:Large (67-100%)] [N/A:N/A] Necrotic A mount: [22:Adherent Slough] [27:Eschar, Adherent Slough] [N/A:N/A] Necrotic Tissue: [22:Fat Layer (Subcutaneous Tissue): Yes Fat Layer (Subcutaneous Tissue): Yes N/A] Exposed  Structures: [22:Fascia: No Tendon: No Muscle: No Joint: No Bone: No Medium (34-66%)] [27:Fascia: No Tendon: No Muscle: No Joint: No Bone: No Small (1-33%)] [N/A:N/A] Epithelialization: [22:Debridement - Selective/Open Wound Debridement - Excisional] [N/A:N/A] Debridement: Pre-procedure Verification/Time Out 09:18 [70:96:28] [N/A:N/A] Taken: [22:Lidocaine 5% topical ointment] [27:Lidocaine 5% topical ointment] [N/A:N/A] Pain Control: [22:Necrotic/Eschar, Slough] [27:Necrotic/Eschar, Subcutaneous,] [N/A:N/A] Tissue Debrided: [22:Non-Viable Tissue] [27:Slough Skin/Subcutaneous Tissue] [N/A:N/A] Level: [22:20] [27:16] [N/A:N/A] Debridement A (sq cm): [22:rea Curette] [27:Curette, Forceps, Scissors] [N/A:N/A] Instrument: [22:Minimum] [27:Minimum] [N/A:N/A] Bleeding: [22:Pressure] [27:Pressure] [N/A:N/A] Hemostasis Achieved: [22:0] [27:0] [N/A:N/A] Procedural Pain: [22:0] [27:0] [N/A:N/A] Post Procedural Pain: [22:Procedure was tolerated well] [27:Procedure was tolerated well] [N/A:N/A] Debridement Treatment Response: [22:10x2x0.1] [27:4x4x0.3] [N/A:N/A] Post Debridement Measurements L x W x D (cm) [22:1.571] [27:3.77] [N/A:N/A] Post Debridement Volume: (cm) [22:Debridement] [27:Debridement] [N/A:N/A] Treatment Notes Electronic Signature(s) Signed: 03/02/2022 9:37:16 AM By: Fredirick Maudlin MD FACS Signed: 03/02/2022 5:05:59 PM By: [name] East RN Entered By: Fredirick Maudlin on 03/02/2022 09:37:16 -------------------------------------------------------------------------------- Multi-Disciplinary Care Plan Details Patient Name: Date of Service: [name]. 03/02/2022 8:45 A M Medical Record Number: [id number] Patient Account Number: [id number] Date of Birth/Sex: Treating RN: 01/19/1951 (71 y.o. [name] Primary Care Laquandra Carrillo: [name] [name]: [name] Jathan Balling: [name] Camron Essman/Extender: [name] in Treatment: 82 Multidisciplinary Care  Plan reviewed with physician Active Inactive Venous Leg Ulcer Nursing Diagnoses: Knowledge deficit related to disease process and management Potential for venous Insuffiency (use before diagnosis confirmed) Goals: Patient will maintain optimal edema control Date Initiated: 07/27/2021 Target Resolution Date: 03/15/2022 Goal Status: Active Interventions: Assess peripheral edema status every visit. Treatment Activities: Therapeutic compression applied : 07/27/2021 Notes: Wound/Skin Impairment Nursing Diagnoses: Impaired tissue integrity Knowledge deficit related to ulceration/compromised skin integrity Goals: Patient will have a decrease in wound volume by X% from date: (specify in notes) Date Initiated: 04/12/2021 Date Inactivated: 01/04/2022 Target Resolution Date: 04/23/2021 Goal Status: Met Patient/caregiver will verbalize understanding of skin care regimen Date Initiated: 01/04/2022 Target Resolution Date: 03/30/2022 Goal Status: Active Ulcer/skin breakdown will have a volume reduction of 30% by week 4 Date Initiated: 04/12/2021 Date Inactivated: 04/27/2021 Target Resolution Date: 04/27/2021 Goal Status: Unmet Unmet Reason: infection Ulcer/skin breakdown will have a volume reduction of 50% by week 8 Date Initiated: 04/27/2021 Date Inactivated: 06/29/2021 Target Resolution Date: 06/24/2021 Goal Status: Met Interventions: Assess patient/caregiver ability to obtain necessary supplies Assess patient/caregiver ability to perform ulcer/skin care regimen upon admission and as needed Assess ulceration(s) every visit Notes: Electronic Signature(s) Signed: 03/02/2022 5:05:59 PM By: [name] East RN Entered By: [name] on 03/02/2022 12:31:52 -------------------------------------------------------------------------------- Pain Assessment Details Patient Name: Date of Service: [name]. 03/02/2022 8:45 A M Medical Record Number: [id number] Patient Account Number:  [id number] Date of Birth/Sex: Treating RN: Jul 08, 1950 (71 y.o.  [name] Primary Care Amijah Timothy: [name] [name]: [name] Dason Mosley: [name] Idamay Hosein/Extender: [name] in Treatment: 27 Active Problems Location of Pain Severity and Description of Pain Patient Has Paino Yes Patient Has Paino Yes Site Locations With Dressing Change: No Duration of the Pain. Constant / Intermittento Intermittent Rate the pain. Current Pain Level: 5 Character of Pain Describe the Pain: Sharp Pain Management and Medication Current Pain Management: Electronic Signature(s) Signed: 03/02/2022 6:17:39 PM By: [name] Catholic RN Entered By: [name] on 03/02/2022 08:58:44 -------------------------------------------------------------------------------- Patient/Caregiver Education Details Patient Name: Date of Service: [name] 9/8/2023andnbsp8:45 A M Medical Record Number: [id number] Patient Account Number: [id number] Date of Birth/Gender: Treating RN: 09/28/1950 (71 y.o. [name] Primary Care Physician: [name] [name]: [name] Physician: [name] Physician/Extender: [name] in Treatment: 69 Education Assessment Education Provided To: Patient Education Topics Provided Wound/Skin Impairment: Methods: Explain/Verbal Responses: Reinforcements needed, State content correctly Electronic Signature(s) Signed: 03/02/2022 5:05:59 PM By: [name] East RN Entered By: [name] on 03/02/2022 09:14:30 -------------------------------------------------------------------------------- Wound Assessment Details Patient Name: Date of Service: [name]. 03/02/2022 8:45 A M Medical Record Number: [id number] Patient Account Number: [id number] Date of Birth/Sex: Treating RN: [birth year] (71 y.o. [name] Primary Care Orchid Glassberg: [name] [name]: [name]  Myra Weng: [name] Dotsie Gillette/Extender: [name] in Treatment: 46 Wound Status Wound Number: 22 Primary Cyst Etiology: Wound Location: Left, Lateral Lower Leg Wound Open Wounding Event: Bump Status: Date Acquired: 06/03/2021 Comorbid Glaucoma, Sleep Apnea, Hypertension, Peripheral Arterial Disease, Weeks Of Treatment: 38 History: Peripheral Venous Disease, Type II Diabetes, Gout, Osteoarthritis, Clustered Wound: Yes Neuropathy Photos Wound Measurements Length: (cm) 10 Width: (cm) 2 Depth: (cm) 0.1 Clustered Quantity: 3 Area: (cm) 15.708 Volume: (cm) 1.571 % Reduction in Area: -852.6% % Reduction in Volume: -19.1% Epithelialization: Medium (34-66%) Tunneling: No Undermining: No Wound Description Classification: Full Thickness With Exposed Support Structures Wound Margin: Fibrotic scar, thickened scar Exudate Amount: Medium Exudate Type: Serosanguineous Exudate Color: red, brown Foul Odor After Cleansing: No Slough/Fibrino Yes Wound Bed Granulation Amount: Large (67-100%) Exposed Structure Granulation Quality: Red, Pink Fascia Exposed: No Necrotic Amount: Small (1-33%) Fat Layer (Subcutaneous Tissue) Exposed: Yes Necrotic Quality: Adherent Slough Tendon Exposed: No Muscle Exposed: No Joint Exposed: No Bone Exposed: No Treatment Notes Wound #22 (Lower Leg) Wound Laterality: Left, Lateral Cleanser Soap and Water Discharge Instruction: May shower and wash wound with dial antibacterial soap and water prior to dressing change. Wound Cleanser Discharge Instruction: Cleanse the wound with wound cleanser prior to applying a clean dressing using gauze sponges, not tissue or cotton balls. Peri-Wound Care Triamcinolone 15 (g) Discharge Instruction: Use triamcinolone 15 (g) as directed Sween Lotion (Moisturizing lotion) Discharge Instruction: Apply moisturizing lotion to the leg Topical Primary Dressing KerraCel Ag Gelling Fiber Dressing, 4x5  in (silver alginate) Discharge Instruction: Apply silver alginate to wound bed as instructed Secondary Dressing ABD Pad, 8x10 Discharge Instruction: Apply over primary dressing as directed. Secured With Elastic Bandage 4 inch (ACE bandage) Discharge Instruction: Secure with ACE bandage as directed. Compression Wrap CoFlex TLC XL 2-layer Compression System 4x7 (in/yd) Discharge Instruction: Apply CoFlex 2-layer compression as directed. (alt for 4 layer) Compression Stockings Add-Ons Electronic Signature(s) Signed: 03/02/2022 5:05:59 PM By: [name] East RN Signed: 03/02/2022 6:17:39 PM By: [name] Catholic RN Entered By: [name] on 03/02/2022 09:12:58 -------------------------------------------------------------------------------- Wound Assessment Details Patient Name: Date of Service: [name]. 03/02/2022 8:45 A M Medical Record Number: [id number]  Patient Account Number: [id number] Date of Birth/Sex: Treating RN: February 17, 1951 (71 y.o. [name] Primary Care Asharia Lotter: [name] [name]: [name] Sherard Sutch: [name] Ester Mabe/Extender: [name] in Treatment: 46 Wound Status Wound Number: 27 Primary Diabetic Wound/Ulcer of the Lower Extremity Etiology: Wound Location: Left, Medial Foot Wound Open Wounding Event: Blister Status: Date Acquired: 02/01/2022 Comorbid Glaucoma, Sleep Apnea, Hypertension, Peripheral Arterial Disease, Weeks Of Treatment: 4 History: Peripheral Venous Disease, Type II Diabetes, Gout, Osteoarthritis, Clustered Wound: No Neuropathy Photos Wound Measurements Length: (cm) 4 Width: (cm) 4 Depth: (cm) 0.3 Area: (cm) 12.566 Volume: (cm) 3.77 % Reduction in Area: -1130.8% % Reduction in Volume: -3596.1% Epithelialization: Small (1-33%) Undermining: No Wound Description Classification: Grade 1 Wound Margin: Distinct, outline attached Exudate Amount: Medium Exudate Type: Serosanguineous Exudate  Color: red, brown Foul Odor After Cleansing: No Slough/Fibrino No Wound Bed Granulation Amount: Small (1-33%) Exposed Structure Granulation Quality: Red, Pale Fascia Exposed: No Necrotic Amount: Large (67-100%) Fat Layer (Subcutaneous Tissue) Exposed: Yes Necrotic Quality: Eschar, Adherent Slough Tendon Exposed: No Muscle Exposed: No Joint Exposed: No Bone Exposed: No Treatment Notes Wound #27 (Foot) Wound Laterality: Left, Medial Cleanser Peri-Wound Care Sween Lotion (Moisturizing lotion) Discharge Instruction: Apply moisturizing lotion as directed Topical Ketoconazole Cream 2% Discharge Instruction: Apply Ketoconazole to top of foot Primary Dressing Hydrofera Blue Classic Foam, 4x4 in Discharge Instruction: Moisten with saline prior to applying to wound bed Secondary Dressing Optifoam Non-Adhesive Dressing, 4x4 in Discharge Instruction: Apply over primary dressing cut to make foam donut Woven Gauze Sponges 2x2 in Discharge Instruction: Apply over primary dressing as directed. Secured With 14M Medipore H Soft Cloth Surgical T ape, 4 x 10 (in/yd) Discharge Instruction: Secure with tape as directed. Compression Wrap Kerlix Roll 4.5x3.1 (in/yd) Discharge Instruction: Apply Kerlix and Coban compression as directed. Coban Self-Adherent Wrap 4x5 (in/yd) Discharge Instruction: Apply over Kerlix as directed. Compression Stockings Add-Ons Electronic Signature(s) Signed: 03/02/2022 5:05:59 PM By: [name] East RN Signed: 03/02/2022 6:17:39 PM By: [name] Catholic RN Entered By: [name] on 03/02/2022 09:13:42 -------------------------------------------------------------------------------- Vitals Details Patient Name: Date of Service: [name]. 03/02/2022 8:45 A M Medical Record Number: [id number] Patient Account Number: [id number] Date of Birth/Sex: Treating RN: August 31, 1950 (71 y.o. [name] Primary Care Gentle Hoge: [name] [name]: [name]  Saryiah Bencosme: [name] Alene Bergerson/Extender: [name] in Treatment: 46 Vital Signs Time Taken: 08:57 Temperature (F): 98.7 Height (in): 74 Pulse (bpm): 55 Weight (lbs): 238 Respiratory Rate (breaths/min): 18 Body Mass Index (BMI): 30.6 Blood Pressure (mmHg): 125/79 Capillary Blood Glucose (mg/dl): 105 Reference Range: 80 - 120 mg / dl Electronic Signature(s) Signed: 03/02/2022 6:17:39 PM By: [name] Catholic RN Entered By: [name] on 03/02/2022 08:58:19

## 2022-03-02 NOTE — Progress Notes (Signed)
KESHAV, WINEGAR ([id number]) Visit Report for 03/02/2022 Chief Complaint Document Details Patient Name: Date of Service: JASMEET, GEHL 03/02/2022 8:45 A M Medical Record Number: [id number] Patient Account Number: [id number] Date of Birth/Sex: Treating RN: 1951-04-06 (71 y.o. [name] Primary Care Provider: [name] Other Clinician: Referring Provider: Treating Provider/Extender: [name] in Treatment: 46 Information Obtained from: Patient Chief Complaint Left leg and foot ulcers 04/12/2021; patient is here for wounds on his left lower leg and left plantar foot over the first metatarsal head Electronic Signature(s) Signed: 03/02/2022 9:37:29 AM By: Fredirick Maudlin MD FACS Entered By: Fredirick Maudlin on 03/02/2022 09:37:29 -------------------------------------------------------------------------------- Debridement Details Patient Name: Date of Service: [name] 03/02/2022 8:45 A M Medical Record Number: [id number] Patient Account Number: [id number] Date of Birth/Sex: Treating RN: 10-21-1950 (71 y.o. [name] Primary Care Provider: [name] Other Clinician: Referring Provider: Treating Provider/Extender: [name] in Treatment: 46 Debridement Performed for Assessment: Wound #27 Left,Medial Foot Performed By: Physician Fredirick Maudlin, MD Debridement Type: Debridement Severity of Tissue Pre Debridement: Fat layer exposed Level of Consciousness (Pre-procedure): Awake and Alert Pre-procedure Verification/Time Out Yes - 09:18 Taken: Start Time: 09:19 Pain Control: Lidocaine 5% topical ointment T Area Debrided (L x W): otal 4.2 (cm) x 4 (cm) = 16.8 (cm) Tissue and other material debrided: Viable, Non-Viable, Eschar, Slough, Subcutaneous, Skin: Epidermis, Slough Level: Skin/Subcutaneous Tissue Debridement Description: Excisional Instrument: Curette, Forceps, Scissors Bleeding: Minimum Hemostasis  Achieved: Pressure Procedural Pain: 0 Post Procedural Pain: 0 Response to Treatment: Procedure was tolerated well Level of Consciousness (Post- Awake and Alert procedure): Post Debridement Measurements of Total Wound Length: (cm) 4.2 Width: (cm) 4 Depth: (cm) 0.3 Volume: (cm) 3.958 Character of Wound/Ulcer Post Debridement: Requires Further Debridement Severity of Tissue Post Debridement: Fat layer exposed Post Procedure Diagnosis Same as Pre-procedure Notes Scribed by Blanche East, Rn for Dr. Celine Ahr Electronic Signature(s) Signed: 03/02/2022 9:44:05 AM By: Fredirick Maudlin MD FACS Signed: 03/02/2022 6:17:39 PM By: Dellie Catholic RN Entered By: Dellie Catholic on 03/02/2022 09:38:47 -------------------------------------------------------------------------------- Debridement Details Patient Name: Date of Service: [name] 03/02/2022 8:45 A M Medical Record Number: [id number] Patient Account Number: [id number] Date of Birth/Sex: Treating RN: 25-May-1951 (71 y.o. [name] Primary Care Provider: [name] Other Clinician: Referring Provider: Treating Provider/Extender: [name] in Treatment: 46 Debridement Performed for Assessment: Wound #22 Left,Lateral Lower Leg Performed By: Physician Fredirick Maudlin, MD Debridement Type: Debridement Level of Consciousness (Pre-procedure): Awake and Alert Pre-procedure Verification/Time Out Yes - 09:18 Taken: Start Time: 09:19 Pain Control: Lidocaine 5% topical ointment T Area Debrided (L x W): otal 10 (cm) x 2 (cm) = 20 (cm) Tissue and other material debrided: Non-Viable, Eschar, Slough, Slough Level: Non-Viable Tissue Debridement Description: Selective/Open Wound Instrument: Curette Bleeding: Minimum Hemostasis Achieved: Pressure Procedural Pain: 0 Post Procedural Pain: 0 Response to Treatment: Procedure was tolerated well Level of Consciousness (Post- Awake and Alert procedure): Post  Debridement Measurements of Total Wound Length: (cm) 10 Width: (cm) 2 Depth: (cm) 0.1 Volume: (cm) 1.571 Character of Wound/Ulcer Post Debridement: Improved Post Procedure Diagnosis Same as Pre-procedure Notes Scribing for Dr. Celine Ahr by Blanche East, RN Electronic Signature(s) Signed: 03/02/2022 4:51:12 PM By: Fredirick Maudlin MD FACS Signed: 03/02/2022 5:05:59 PM By: Blanche East RN Previous Signature: 03/02/2022 9:44:05 AM Version By: Fredirick Maudlin MD FACS Entered By: Blanche East on 03/02/2022 12:29:47 -------------------------------------------------------------------------------- HPI Details Patient Name: Date of Service: [name] 03/02/2022  8:45 A M Medical Record Number: [id number] Patient Account Number: [id number] Date of Birth/Sex: Treating RN: July 07, 1950 (71 y.o. [name] Primary Care Provider: [name] Other Clinician: Referring Provider: Treating Provider/Extender: [name] in Treatment: 68 History of Present Illness HPI Description: 10/11/17; Mr. Kedzierski is a 71 year old man who tells me that in 2015 he slipped down the latter traumatizing his left leg. He developed a wound in the same spot the area that we are currently looking at. He states this closed over for the most part although he always felt it was somewhat unstable. In 2016 he hit the same area with the door of his car had this reopened. He tells me that this is never really closed although sometimes an inflow it remains open on a constant basis. He has not been using any specific dressing to this except for topical antibiotics the nature of which were not really sure. His primary doctor did send him to see Dr. [name] of interventional cardiology. He underwent an angiogram on 08/06/17 and he underwent a PTA and directional atherectomy of the lesser distal SFA and popliteal arteries which resulted in brisk improvement in blood flow. It was noted that he had 2 vessel runoff  through the anterior tibial and peroneal. He is also been to see vascular and interventional radiologist. He was not felt to have any significant superficial venous insufficiency. Presumably is not a candidate for any ablation. It was suggested he come here for wound care. The patient is a type II diabetic on insulin. He also has a history of venous insufficiency. ABIs on the left were noncompressible in our clinic 10/21/17; patient we admitted to the clinic last week. 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 We put Iodosorb on him after an aggressive debridement and 3 layer compression. He complained of pain in his ankle and itching with is skin in fact he scratched the area on the medial calf superiorly at the rim of our wraps and he has 2 small open areas in that location today which are new. I changed his primary dressing today to silver collagen. As noted he is already had revascularization and does not have any significant superficial venous insufficiency that would be amenable to ablation 10/28/17; patient admitted to the clinic 2 weeks ago. He has a smaller Wound. Scratch injury from last week revealed. There is large wound over the tibial area. This is smaller. Granulation looks healthy. No need for debridement. 11/04/17; the wound on the left lateral calf looks better. Improved dimensions. Surface of this looks better. We've been maintaining him and Kerlix Coban wraps. He finds this much more comfortable. Silver collagen dressing 11/11/17; left lateral Wound continues to look healthy be making progress. Using a #5 curet I removed removed nonviable skin from the surface of the wound and then necrotic debris from the wound surface. Surface of the wound continues to look healthy. He also has an open area on the left great toenail bed. We've been using topical antibiotics. 11/19/17; left anterior lateral wound continues to look  healthy but it's not closed. He also had a small wound above this on the left leg Initially traumatic wounds in the setting of significant chronic venous insufficiency and stasis dermatitis 11/25/17; left anterior wounds superiorly is closed still a small wound inferiorly. 12/02/17; left anterior tibial area. Arrives today with adherent callus. Post debridement clearly not completely closed. Hydrofera Blue under  3 layer compression. 12/09/17; left anterior tibia. Circumferential eschar however the wound bed looks stable to improved. We've been using Hydrofera Blue under 3 layer compression 12/17/17; left anterior tibia. Apparently this was felt to be closed however when the wrap was taken off there is a skin tear to reopen wounds in the same area we've been using Hydrofera Blue under 3 layer compression 12/23/17 left anterior tibia. Not close to close this week apparently the [employer] was stuck to this again. Still circumferential eschar requiring debridement. I put a contact layer on this this time under the Hydrofera Blue 12/31/17; left anterior tibia. Wound is better slight amount of hyper-granulation. Using Hydrofera Blue over Adaptic. 01/07/18; left anterior tibia. The wound had some surface eschar however after this was removed he has no open wound.he was already revascularized by Dr. [name] when he came to our clinic with atherectomy of the left SFA and popliteal artery. He was also sent to interventional radiology for venous reflux studies. He was not felt to have significant reflux but certainly has chronic venous changes of his skin with hemosiderin deposition around this area. He will definitely need to lubricate his skin and wear compression stocking and I've talked to him about this. READMISSION 05/26/2018 This is a now 71 year old man we cared for with traumatic wounds on his left anterior lower extremity. He had been previously revascularized during that admission by Dr. [name]. Apparently  in follow-up Dr. [name] noted that he had deterioration in his arterial status. He underwent a stent placement in the distal left SFA on 04/22/2018. 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 He went back to the angiography suite on 04/30/2018 he underwent PTA and balloon angioplasty of the occluded left mid anterior tibial artery, thrombotic occlusion went from 100 to 0% which reconstitutes the posterior tibial artery. He had thrombectomy and aspiration of the peroneal artery. The stent placed in the distal SFA left SFA was still occluded. 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 He has been recuperating at [facility] in [facility] after the most recent hospitalization. He is going home tomorrow. He tells me that 2 weeks ago he traumatized the tip of his left fifth toe. He came in urgently for our review of this. This was a history of before I noted that Dr. [name] had already noted dry gangrenous changes of the left fifth toe 06/09/2018; 2-week follow-up. I did contact Dr. [name] after his last appointment and he apparently saw 1 of Dr. Irven Shelling colleagues the next day. He does not follow-up with Dr. [name] himself until Thursday of this week. 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 Nevertheless there is no evidence of infection no drainage and no pain. He had a new area that this week when we were signing him in today on the left anterior mid tibia area, this is in close proximity to the previous wound we have dealt with in this clinic. 06/23/2018; 2-week follow-up. I did not receive a recent note from Dr. [name] to review today. Our office is trying to obtain this. He is apparently not planning to do further  vascular interventions and wondered about compression to try and help with the patient's  chronic venous insufficiency. However we are also concerned about the arterial flow. He arrives in clinic today with a new area on the left third toe. The areas on the calf/anterior tibia are close to closing. The left fifth toe is still mummified using Betadine. -In reviewing things with the patient he has what sounds like claudication with mild to moderate amount of activity. 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 I prescribed Levaquin over the phone while we attempted to arrange a plan of care. However the patient called yesterday to report he had low-grade fever and he came in today acutely. There is been a marked deterioration in the left third toe with spreading cellulitis up into the dorsal left foot. He was referred to the emergency room. Readmission: 06/29/2020 patient presents today for reevaluation here in our clinic he was previously treated by Dr. Dellia Nims at the latter part of 2019 in 2 the beginning of 2020. Subsequently we have not seen him since that time in the interim he did have evaluation with vein and vascular specialist specifically Dr. Anice Paganini who did perform quite extensive work for a left femoral to anterior tibial artery bypass. With that being said in the interim the patient has developed significant lymphedema and has wounds that he tells me have really never healed in regard to the incision site on the left leg. He also has multiple wounds on the feet for various reasons some of which is that he tends to pick at his feet. Fortunately there is no signs of active infection systemically at this time he does have some wounds that are little bit deeper but most are fairly superficial he seems to have good blood flow and overall everything appears to be healthy I see no bone exposed and no obvious signs of osteomyelitis. I do not know that he necessarily  needs a x-ray at this point although that something we could consider depending on how things progress. The patient does have a history of lymphedema, diabetes, this is type II, chronic kidney disease stage III, hypertension, and history of peripheral vascular disease. 07/05/2020; patient admitted last week. Is a patient I remember from 2019 he had a spreading infection involving the left foot and we sent him to the hospital. 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 He subsequently had a left femoral to anterior tibial bypass by Dr.Cain vein and vascular. He also has severe lymphedema with chronic skin changes related to that on the left leg. The most problematic area that was new today was on the left medial great toe. This was apparently a small area last week there was purulent drainage which our intake nurse cultured. Also areas on the left medial foot and heel left lateral foot. He has 2 areas on the left medial calf left lateral calf in the setting of the severe lymphedema. 07/13/2020 on evaluation today patient appears to be doing better in my opinion compared to his last visit. The good news is there is no signs of active infection systemically and locally I do not see any signs of infection either. He did have an x-ray which was negative that is great news he had a culture which showed MRSA but at the same time he is been on the doxycycline which has helped. I do think we may want to extend this for 7 additional days 1/25; patient admitted to the clinic a few weeks ago. 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 We have been putting him under compression his edema control is a lot better but he is severe verricused skin on the left leg. He is really done quite well he still has an open area on the left medial calf and the left medial first metatarsal head. We have been using silver collagen on the leg silver  alginate on the foot 07/27/2020 upon evaluation today patient appears to be doing decently well in regard to his wounds. He still has a lot of dry skin on the left leg. Some of this is starting to peel back and I think he may be able to have them out by removing some that today. Fortunately there is no signs of active infection at this time on the left leg although on the right leg he does appear to have swelling and erythema as well as some mild warmth to touch. This does have been concerned about the possibility of cellulitis although within the differential diagnosis I do think that potentially a DVT has to be at least considered. We need to rule that out before proceeding would just call in the cellulitis. Especially since he is having pain in the posterior aspect of his calf muscle. 2/8; the patient had seen sparingly. He has severe skin changes of chronic lymphedema in the left leg thickened hyperkeratotic verrucous skin. He has an open wound on the medial part of the left first met head left mid tibia. He also has a rim of nonepithelialized skin in the anterior mid tibia. He brought in the AmLactin lotion that was been prescribed although I am not sure under compression and its utility. There concern about cellulitis on the right lower leg the last time he was here. He was put on on antibiotics. His DVT rule out was negative. The right leg looks fine he is using his stocking on this area 08/10/2020 upon evaluation today patient appears to be doing well with regard to his leg currently. He has been tolerating the dressing changes without complication. Fortunately there is no signs of active infection which is great news. Overall very pleased with where things stand. 2/22; the patient still has an area on the medial part of the left first met his head. This looks better than when I last saw this earlier this month he has a rim of epithelialization but still some surface debris. Mostly everything on  the left leg is healed. There is still a vulnerable in the left mid tibia area. 08/30/2020 upon evaluation today patient appears to be doing much better in regard to his wounds on his foot. 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 Nonetheless I think we will address that today I Minna send in a prescription for him in that regard. Overall though there does not appear to be any signs of significant worsening. 09/07/2020 on evaluation today patient's wounds over his left foot appear to be doing excellent. I do not see any signs of infection there is some callus buildup this can require debridement for certain but overall I feel like he is managing quite nicely. He still using the AmLactin cream which has been beneficial for him as well. 3/22; left foot wound is closed. There is no open area here. He is using ammonium lactate lotion to the lower extremities  to help exfoliate dry cracked skin. He has compression stockings from elastic therapy in [place]. The wound on the medial part of his left first met head is healed today. READMISSION 04/12/2021 Mr. Vanosdol is a patient we know fairly well he had a prolonged stay in clinic in 2019 with wounds on his left lateral and left anterior lower extremity in the setting of chronic venous insufficiency. More recently he was here earlier this year with predominantly an area on his left foot first metatarsal head plantar and he says the plantar foot broke down on its not long after we discharged him but he did not come back here. The last few months areas of broken down on his left anterior and again the left lateral lower extremity. The leg itself is very swollen chronically enlarged a lot of hyperkeratotic dry Berry Q skin in the left lower leg. His edema extends well into the thigh. He was seen by Dr. Donzetta Matters. He had ABIs on  03/02/2021 showing an ABI on the right of 1 with a TBI of 0.72 his ABI in the left at 1.09 TBI of 0.99. Monophasic and biphasic waveforms on the right. On the left monophasic waveforms were noted he went on to have an angiogram on 03/27/2021 this showed the aortic aortic and iliac segments were free of flow-limiting stenosis the left common femoral vein to evaluate the left femoral to anterior tibial artery bypass was unobstructed the bypass was patent without any areas of stenosis. We discharged the patient in bilateral juxta lite stockings but very clearly that was not sufficient to control the swelling and maintain skin integrity. He is clearly going to need compression pumps. 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 This is fortunate because he is on his feet for long periods of time. 10/27; patient comes in with our intake nurse reporting copious amount of green drainage from the left anterior mid tibia the left dorsal foot and to a lesser extent the left medial mid tibia. We left the compression wrap on all week for the amount of edema in his left leg is quite a bit better. We use silver alginate as the primary dressing 11/3; edema control is good. Left anterior lower leg left medial lower leg and the plantar first metatarsal head. The left anterior lower leg required debridement. Deep tissue culture I did of this wound showed MRSA I put him on 10 days of doxycycline which she will start today. We have him in compression wraps. He has a security card and [employer] however he is retiring on November 15. We will need to then get him into a better offloading boot for the left foot perhaps a total contact cast 11/10; edema control is quite good. Left anterior and left medial lower leg wounds in the setting of chronic venous insufficiency and lymphedema. He also has a substantial area over the left plantar first metatarsal head. I treated him for MRSA  that we identified on the major wound on the left anterior mid tibia with doxycycline and gentamicin topically. He has significant hypergranulation on the left plantar foot wound. The patient is a diabetic but he does not have significant PAD 11/17; edema control is quite good. Left anterior and left medial lower leg wounds look better. The really concerning area remains the area on the left plantar first metatarsal head. He has a rim of epithelialization. He has been using a  surgical shoe The patient is now retired from a a [employer] I have gone over with him the need to offload this area aggressively. Starting today with a forefoot off loader but . possibly a total contact cast. 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 Arrives with a very significant odor from I think all of the wounds on the left leg and the left foot. Because of this I did not put a total contact cast on him today but will could still consider this. His wife was having cataract surgery which is the reason he missed the appointment 12/6. I saw this man 5 days ago with a swelling below the popliteal fossa. I thought he actually might have a Baker's cyst however the DVT rule out study that we could arrange right away was negative the technician told me this was not a ruptured Baker's cyst. We attempted to get this aspirated by under ultrasound guidance in interventional radiology however all they did was an ultrasound however it shows an extensive fluid collection 62 x 8 x 9.4 in the left thigh and left calf. The patient states he thinks this started 8 days ago or so but he really is not complaining of any pain, fever or systemic symptoms. He has not ha 12/20; after some difficulty I managed to get the patient into see Dr. Donzetta Matters. Eventually he was taken into the hospital and had a drain put in the fluid  collection below his left knee posteriorly extending into the posterior thigh. He still has the drain in place. Culture of this showed moderate staff aureus few Morganella and few Klebsiella he is now on doxycycline and ciprofloxacin as suggested by infectious disease he is on this for a month. The drain will remain in place until it stops draining 12/29; he comes in today with the 1 wound on his left leg and the area on the left plantar first met head significantly smaller. Both look healthy. He still has the drain in the left leg. He says he has to change this daily. Follows up with Dr. Donzetta Matters on January 11. 06/29/2021; the wounds that I am following on the left leg and left first met head continued to be quite healthy. However the area where his inferior drain is in place had copious amounts of drainage which was green in color. The wound here is larger. Follows up with Dr. Gwenlyn Saran of vein and vascular his surgeon next week as well as infectious disease. He remains on ciprofloxacin and doxycycline. He is not complaining of excessive pain in either one of the drain areas 1/12; the patient saw vascular surgery and infectious disease. Vascular surgery has left the drain in place as there was still some notable drainage still see him back in 2 weeks. Dr. Velna Ochs stop the doxycycline and ciprofloxacin and I do not believe he follows up with them at this point. Culture I did last week showed both doxycycline resistant MRSA and Pseudomonas not sensitive to ciprofloxacin although only in rare titers 1/19; the patient's wound on the left anterior lower leg is just about healed. We have continued healing of the area that was medially on the left leg. Left first plantar metatarsal head continues to get smaller. The major problem here is his 2 drain sites 1 on the left upper calf and lateral thigh. There is purulent drainage still  from the left lateral thigh. I gave him antibiotics last week but we still have  recultured. He has the drain in the area I think this is eventually going to have to come out. I suspect there will be a connecting wound to heal here perhaps with improved VAc 1/26; the patient had his drain removed by vein and vascular on 1/25/. This was a large pocket of fluid in his left thigh that seem to tunnel into his left upper calf. 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 His mention his Penrose drain was removed today. He now has a tunneling wound on his left calf and left thigh. Both of these probe widely towards each other although I cannot really prove that they connect. Both wounds on his lower leg anteriorly are closed and his area over the first metatarsal head on his right foot continues to improve. We are using Hydrofera Blue here. He also saw infectious disease culture of the abscess they noted was polymicrobial with MRSA, Morganella and Klebsiella he was treated with doxycycline and ciprofloxacin for 4 weeks ending on 07/03/2021. They did not recommend any further antibiotics. Notable that while he still had the Penrose drain in place last week he had purulent drainage coming out of the inferior IandD site this grew [facility], MRSA and Pseudomonas but there does not appear to be any active infection in this area today with the drain out and he is not systemically unwell 2/2; with regards to the drain sites the superior one on the thigh actually is closed down the one on the upper left lateral calf measures about 8 and half centimeters which is an improvement seems to be less prominent although still with a lot of drainage. The only remaining wound is over the first metatarsal head on the left foot and this looks to be continuing to improve with Hydrofera Blue. 2/9; the area on his plantar left foot continues to contract. Callus around the wound edge. The drain sites specifically have not come down in depth. We put the wound VAC on Monday he changed the  canister late last night our intake nurse reported a pocket of fluid perhaps caused by our compression wraps 2/16; continued improvement in left foot plantar wound. drainage site in the calf is not improved in terms of depth (wound vac) 2/23; continued improvement in the left foot wound over the first metatarsal head. With regards to the drain sites the area on his thigh laterally is healed however the open area on his calf is small in terms of circumference by still probes in by about 15 cm. Within using the wound VAC. Hydrofera Blue on his foot 08/24/2021: The left first metatarsal head wound continues to improve. The wound bed is healthy with just some surrounding callus. Unfortunately the open drain site on his calf remains open and tunnels at least 15 cm (the extent of a Q-tip). This is despite several weeks of wound VAC treatment. Based on reading back through the notes, there has been really no significant change in the depth of the wound, although the orifice is smaller and the more cranial wound on his thigh has closed. I suspect the tunnel tracks nearly all the way to this location. 08/31/2021: Continued improvement in the left first metatarsal head wound. There has been absolutely no improvement to the long tunnel from his open drain site on his calf. We have tried to get him into see vascular surgery sooner to consider the  possibility of simply filleting the tract open and allowing it to heal from the bottom up, likely with a wound VAC. They have not yet scheduled a sooner appointment than his current mid April 09/14/2021: He was seen by vascular surgery and they took him to the operating room last week. They opened a portion of the tunnel, but did not extend the entire length of the known open subcutaneous tract. I read Dr. Claretha Cooper operative note and it is not clear from that documentation why only a portion of the tract was opened. The heaped up granulation tissue was curetted and removed  from at least some portion of the tract. They did place a wound VAC and applied an Unna boot to the leg. The ulcer on his left first metatarsal head is smaller today. The bed looks good and there is just a small amount of surrounding callus. 09/21/2021: The ulcer on his left first metatarsal head looks to be stalled. There is some callus surrounding the wound but the wound bed itself does not appear particularly dynamic. The tunnel tract on his lateral left leg seems to be roughly the same length or perhaps slightly smaller but the wound bed appears healthy with good granulation tissue. He opened up a new wound on his medial thigh and the site of a prior surgical incision. He says that he did this unconsciously in his sleep by scratching. 09/28/2021: Unfortunately, the ulcer on his left first metatarsal head has extended underneath the callus toward the dorsum of his foot. The medial thigh wounds are roughly the same. The tunnel on his lateral left leg continues to be problematic; it is longer than we are able to actually probe with a Q-tip. I am still not certain as to why Dr. Donzetta Matters did not open this up entirely when he took the patient to the operating room. We will likely be back in the same situation with just a small superficial opening in a long unhealed tract, as the open portion is granulating in nicely. 10/02/2021: The patient was initially scheduled for a nurse visit, but we are also applying a total contact cast today. The plantar foot wound looks clean without significant accumulated callus. We have been applying Prisma silver collagen to the site. 10/05/2021: The patient is here for his first total contact cast change. We have tried using gauze packing strips in the tunnel on his lateral leg wound, but this does not seem to be working any better than the white VAC foam. The foot ulcer looks about the same with minimal periwound callus. Medial thigh wound is clean with just some overlying  eschar. 10/12/2021: The plantar foot wound is stable without any significant accumulation of periwound callus. The surface is viable with good granulation tissue. The medial thigh wounds are much smaller and are epithelializing. On the other hand, he had purulent drainage coming from the tunnel on his lateral leg. He does go back to see Dr. Donzetta Matters next week and is planning to ask him why the wound tunnel was not completely opened at the time of his most recent operation. 10/19/2021: The plantar foot wound is markedly improved and has epithelial tissue coming through the surface. The medial thigh wounds are nearly closed with just a tiny open area. He did see Dr. Donzetta Matters earlier this week and apparently they did discuss the possibility of opening the sinus tract further and enabling a wound VAC application. Apparently there are some limits as to what Dr. Donzetta Matters feels comfortable opening, presumably  in relationship to his bypass graft. I think if we could get the tract open to the level of the popliteal fossa, this would greatly aid in her ability to get this chart closed. That being said, however, today when I probed the tract with a Q-tip, I was not able to insert the entirety of the Q-tip as I have on previous occasions. The tunnel is shorter by about 4 cm. The surface is clean with good granulation tissue and no further episodes of purulent drainage. 10/30/2021: Last week, the patient underwent surgery and had the long tract in his leg opened. There was a rind that was debrided, according to the operative report. His medial thigh ulcers are closed. The plantar foot wound is clean with a good surface and some built up surrounding callus. 11/06/2021: The overall dimensions of the large wound on his lateral leg remain about the same, but there is good granulation tissue present and the tunneling is a little bit shorter. He has a new wound on his anterior tibial surface, in the same location where he had a similar  lesion in the past. The plantar foot wound is clean with some buildup surrounding callus. Just toward the medial aspect of his foot, however, there is an area of darkening that once debrided, revealed another opening in the skin surface. 11/13/2021: The anterior tibial surface wound is closed. The plantar foot wound has some surrounding callus buildup. The area of darkening that I debrided last week and revealed an opening in the skin surface has closed again. The tunnel in the large wound on his lateral leg has come in by about 3 cm. There is healthy granulation tissue on the entire wound surface. 11/23/2021: The patient was out of town last week and did wet-to-dry dressings on his large wound. 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 Unfortunately, he picked open the wound on his left medial thigh. He says that it was itching and he just could not stop scratching it until it was open again. The wound on his plantar foot is smaller and has not accumulated a tremendous amount of callus. The lateral leg wound is shallower and the tunnel has also decreased in depth. There is just a little bit of slough accumulation on the surface. 11/30/2021: Another portion of his left medial thigh has been opened up. All of these wounds are fairly superficial with just a little bit of slough and eschar accumulation. The wound on his plantar foot is almost closed with just a bit of eschar and periwound callus accumulation. The lateral leg wound is nearly flush with the surrounding skin and the tunnel is markedly shallower. 12/07/2021: There is just 1 open area on his left medial thigh. It is clean with just a little bit of perimeter eschar. The wound on his plantar foot continues to contract and just has some eschar and periwound callus accumulation. The lateral leg wound is closing at the more distal aspect and the tunnel is smaller. The surface is nearly  flush with the surrounding skin and it has a good bed of granulation tissue. 12/14/2021: The thigh and foot wounds are closed. The lateral leg wound has closed over approximately half of its length. The tunnel continues to contract and the surface is now flush with the surrounding skin. The wound bed has robust granulation tissue. 12/22/2021: The thigh and foot wounds have reopened. The foot wound has a lot of callus  accumulation around and over it. The thigh wound is tiny with just a little bit of slough in the wound bed. The lateral leg wound continues to contract. His vascular surgeon took the wound VAC off earlier in the week and the patient has been doing wet-to-dry dressings. There is a little slough accumulation on the surface. The tunnel is about 3 cm in depth at this point. 12/28/2021: The thigh wound is closed again. The foot wound has some callus that subsequently has peeled back exposing just a small slit of a wound. The lateral leg wound Is down to about half the size that it originally was and the tunnel is down to about half a centimeter in depth. 01/04/2022: The thigh wound remains closed. The foot wound has heavy callus overlying the wound site. Once this was debrided, the wound was found to be closed. The lateral leg wound is smaller again this week and very superficial. No tunnel could be identified. 01/12/2022: The thigh and foot wounds both remain closed. The lateral leg wound is now nearly flush with the skin surface. There is good granulation tissue present with a light layer of slough. 01/19/2022: Due to the way his wrap was placed, the patient did not change the dressing on his thigh at all and so the foam was saturated and his skin is macerated. There is a light layer of slough on the wound surface. The underlying granulation tissue is robust and healthy-appearing. He has heavy callus buildup at the site of his first metatarsal head wound which is still healed. 02/01/2022: He has  been in silver alginate. When he removed the dressing from his thigh wound, however, some leg, superficially reopening a portion of the wound that had healed. In addition, underneath the callus at his left first metatarsal head, there appears to be a blister and the wound appears to be open again. 02/08/2022: The lateral leg wound has contracted substantially. There is eschar and a light layer of slough present. He says that it is starting to pull and is uncomfortable. On inspection, there is some puckering of the scar and the eschar is quite dry; this may account for his symptoms. On his first metatarsal head, the wound is much smaller with just some eschar on the surface. The callus has not reaccumulated. He reports that he had a blister come up on his medial thigh wound at the distal aspect. It popped and there is now an opening in his skin again. Looking back through his [place] of wound photos, there is what looks like a permanent suture just deep to this location and it may be trying to erode through. We have been using silver alginate on his wounds. 02/15/2022: The lateral leg wound is about half the size it was last week. It is clean with just a little perimeter eschar and light slough. The wound on his first metatarsal head is about the same with heavy callus overlying it. The medial thigh wound is closed again. He does have some skin changes on the top of his foot that looks potentially yeast related. 02/22/2022: The skin on the top of his foot improved with the use of a topical antifungal. The lateral leg wound continues to contract and is again smaller this week. There is a little bit of slough and eschar on the surface. The first metatarsal head wound is a little bit smaller but has reaccumulated a thick callus over the top. He decided to try to trim his toenail and ultimately took  the entire nail off of his left great toe. 03/02/2022: His lateral leg wound continues to improve, as does the  wound on his left great toe. Unfortunately, it appears that somehow his foot got wet and moisture seeped in through the opening causing his skin to lift. There is a large wound now overlying his first metatarsal on both the plantar, medial, and dorsal portion of his foot. There is necrotic tissue and slough present underneath the shaggy macerated skin. Electronic Signature(s) Signed: 03/02/2022 9:39:14 AM By: Fredirick Maudlin MD FACS Entered By: Fredirick Maudlin on 03/02/2022 09:39:13 -------------------------------------------------------------------------------- Physical Exam Details Patient Name: Date of Service: [name] 03/02/2022 8:45 A M Medical Record Number: [id number] Patient Account Number: [id number] Date of Birth/Sex: Treating RN: 07/24/1950 (71 y.o. [name] Primary Care Provider: [name] Other Clinician: Referring Provider: Treating Provider/Extender: [name] in Treatment: 50 Constitutional . Slightly bradycardic, asymptomatic.. . . No acute distress.Marland Kitchen Respiratory Normal work of breathing on room air.. Notes 03/02/2022: His lateral leg wound continues to improve, as does the wound on his left great toe. Unfortunately, it appears that somehow his foot got wet and moisture seeped in through the opening causing his skin to lift. There is a large wound now overlying his first metatarsal on both the plantar, medial, and dorsal portion of his foot. There is necrotic tissue and slough present underneath the shaggy macerated skin. Electronic Signature(s) Signed: 03/02/2022 9:39:55 AM By: Fredirick Maudlin MD FACS Entered By: Fredirick Maudlin on 03/02/2022 09:39:55 -------------------------------------------------------------------------------- Physician Orders Details Patient Name: Date of Service: [name] 03/02/2022 8:45 A M Medical Record Number: [id number] Patient Account Number: [id number] Date of Birth/Sex: Treating  RN: 05/12/1951 (71 y.o. 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 - Dr Celine Ahr - Room 1 Return A Thursday 9/14 @ 08:15 am Anesthetic Wound #22 Left,Lateral Lower Leg (In clinic) Topical Lidocaine 5% applied to wound bed - In clinic, prior to debridement Bathing/ Shower/ Hygiene May shower with protection but do not get wound dressing(s) wet. - Use a cast protector so you can shower without getting your wrap(s) wet Edema Control - Lymphedema / SCD / Other Elevate legs to the level of the heart or above for 30 minutes daily and/or when sitting, a frequency of: - throughout the day Avoid standing for long periods of time. Patient to wear own compression stockings every day. - on right leg; Moisturize legs daily. - Ammonium lactate to right leg daily Wound Treatment Wound #22 - Lower Leg Wound Laterality: Left, Lateral Cleanser: Soap and Water 3 x Per Week/30 Days Discharge Instructions: May shower and wash wound with dial antibacterial soap and water prior to dressing change. Cleanser: Wound Cleanser 3 x Per Week/30 Days Discharge Instructions: Cleanse the wound with wound cleanser prior to applying a clean dressing using gauze sponges, not tissue or cotton balls. Peri-Wound Care: Triamcinolone 15 (g) 3 x Per Week/30 Days Discharge  Instructions: Use triamcinolone 15 (g) as directed  Peri-Wound Care: Sween Lotion (Moisturizing lotion) 3 x Per Week/30 Days Discharge Instructions: Apply moisturizing lotion to the leg Prim Dressing: KerraCel Ag Gelling Fiber Dressing, 4x5 in (silver alginate) 3 x Per Week/30 Days ary Discharge Instructions: Apply silver alginate to wound bed as instructed Secondary Dressing: ABD Pad, 8x10 (Generic) 3 x Per Week/30 Days Discharge Instructions: Apply over primary dressing as directed. Secured With: Elastic Bandage 4 inch (ACE bandage) 3 x Per Week/30 Days Discharge Instructions: Secure with ACE bandage as directed. Compression Wrap: CoFlex TLC XL 2-layer Compression System 4x7 (in/yd) 3 x Per Week/30 Days Discharge Instructions: Apply CoFlex 2-layer compression as directed. (alt for 4 layer) Wound #27 - Foot Wound Laterality: Left, Medial Peri-Wound Care: Sween Lotion (Moisturizing lotion) 1 x Per Week Discharge Instructions: Apply moisturizing lotion as directed Topical: Ketoconazole Cream 2% 1 x Per Week Discharge Instructions: Apply Ketoconazole to top of foot Prim Dressing: Hydrofera Blue Classic Foam, 4x4 in 1 x Per Week ary Discharge Instructions: Moisten with saline prior to applying to wound bed Secondary Dressing: Optifoam Non-Adhesive Dressing, 4x4 in 1 x Per Week Discharge Instructions: Apply over primary dressing cut to make foam donut Secondary Dressing: Woven Gauze Sponges 2x2 in 1 x Per Week Discharge Instructions: Apply over primary dressing as directed. Secured With: 56M Medipore H Soft Cloth Surgical T ape, 4 x 10 (in/yd) 1 x Per Week Discharge Instructions: Secure with tape as directed. Compression Wrap: Kerlix Roll 4.5x3.1 (in/yd) 1 x Per Week Discharge Instructions: Apply Kerlix and Coban compression as directed. Compression Wrap: Coban Self-Adherent Wrap 4x5 (in/yd) 1 x Per Week Discharge Instructions: Apply over Kerlix as directed. Electronic Signature(s) Signed:  03/02/2022 9:44:05 AM By: Fredirick Maudlin MD FACS Previous Signature: 03/02/2022 9:11:13 AM Version By: Fredirick Maudlin MD FACS Entered By: Fredirick Maudlin on 03/02/2022 09:40:14 -------------------------------------------------------------------------------- Problem List Details Patient Name: Date of Service: [name] 03/02/2022 8:45 A M Medical Record Number: [id number] Patient Account Number: [id number] Date of Birth/Sex: Treating RN: 01-Feb-1951 (71 y.o. [name] Primary Care Provider: [name] Other Clinician: Referring Provider: Treating Provider/Extender: [name] in Treatment: 40 Active Problems ICD-10 Encounter Code Description Active Date MDM Diagnosis 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 04/12/2021 No Yes I89.0 Lymphedema, not elsewhere classified 04/12/2021 No Yes I87.322 Chronic venous hypertension (idiopathic) with inflammation of left lower 04/12/2021 No Yes extremity 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 ulcer of other part of left lower leg with other specified 04/12/2021 No Yes severity 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 of other part of left foot with other specified 04/12/2021 No Yes severity Inactive Problems ICD-10 Code Description Active Date Inactive Date E11.621 Type 2 diabetes mellitus with foot ulcer 04/12/2021 04/12/2021 E11.42 Type 2 diabetes mellitus with diabetic polyneuropathy 04/12/2021 04/12/2021 L02.416 Cutaneous abscess of left lower limb 06/13/2021 06/13/2021 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  03/02/2022 8:45 A M Medical Record Number: [id number] Patient Account Number: [id number] Date of Birth/Sex: Treating RN: [birth year] (71 y.o. [name] Primary Care Provider: [name] Other Clinician: Referring Provider: Treating Provider/Extender: [name] in Treatment: 48 Subjective Chief Complaint Information obtained from Patient Left leg and foot ulcers 04/12/2021; patient is here for wounds on his left lower leg and left plantar  foot over the first metatarsal head History of Present Illness (HPI) 10/11/17; Mr. Meaders is a 71 year old man who tells me that in 2015 he slipped down the latter traumatizing his left leg. He developed a wound in the same spot the area that we are currently looking at. He states this closed over for the most part although he always felt it was somewhat unstable. In 2016 he hit the same area with the door of his car had this reopened. He tells me that this is never really closed although sometimes an inflow it remains open on a constant basis. He has not been using any specific dressing to this except for topical antibiotics the nature of which were not really sure. His primary doctor did send him to see Dr. [name] of interventional cardiology. He underwent an angiogram on 08/06/17 and he underwent a PTA and directional atherectomy of the lesser distal SFA and popliteal arteries which resulted in brisk improvement in blood flow. It was noted that he had 2 vessel runoff through the anterior tibial and peroneal. He is also been to see vascular and interventional radiologist. He was not felt to have any significant superficial venous insufficiency. Presumably is not a candidate for any ablation. It was suggested he come here for wound care. The patient is a type II diabetic on insulin. He also has a history of venous insufficiency. ABIs on the left were noncompressible in our clinic 10/21/17; patient we admitted to the clinic last  week. 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 We put Iodosorb on him after an aggressive debridement and 3 layer compression. He complained of pain in his ankle and itching with is skin in fact he scratched the area on the medial calf superiorly at the rim of our wraps and he has 2 small open areas in that location today which are new. I changed his primary dressing today to silver collagen. As noted he is already had revascularization and does not have any significant superficial venous insufficiency that would be amenable to ablation 10/28/17; patient admitted to the clinic 2 weeks ago. He has a smaller Wound. Scratch injury from last week revealed. There is large wound over the tibial area. This is smaller. Granulation looks healthy. No need for debridement. 11/04/17; the wound on the left lateral calf looks better. Improved dimensions. Surface of this looks better. We've been maintaining him and Kerlix Coban wraps. He finds this much more comfortable. Silver collagen dressing 11/11/17; left lateral Wound continues to look healthy be making progress. Using a #5 curet I removed removed nonviable skin from the surface of the wound and then necrotic debris from the wound surface. Surface of the wound continues to look healthy. ooHe also has an open area on the left great toenail bed. We've been using topical antibiotics. 11/19/17; left anterior lateral wound continues to look healthy but it's not closed. ooHe also had a small wound above this on the left leg ooInitially traumatic wounds in the setting of significant chronic venous insufficiency and stasis dermatitis 11/25/17; left anterior wounds superiorly is closed still a small wound inferiorly. 12/02/17; left anterior tibial area. Arrives today with adherent callus. Post debridement clearly not completely closed. Hydrofera Blue under 3 layer compression. 12/09/17; left anterior  tibia. Circumferential eschar however the wound bed looks stable to improved. We've been using Hydrofera Blue under 3 layer compression 12/17/17; left anterior tibia. Apparently this was felt to be  closed however when the wrap was taken off there is a skin tear to reopen wounds in the same area we've been using Hydrofera Blue under 3 layer compression 12/23/17 left anterior tibia. Not close to close this week apparently the [facility] was stuck to this again. Still circumferential eschar requiring debridement. I put a contact layer on this this time under the Hydrofera Blue 12/31/17; left anterior tibia. Wound is better slight amount of hyper-granulation. Using Hydrofera Blue over Adaptic. 01/07/18; left anterior tibia. The wound had some surface eschar however after this was removed he has no open wound.he was already revascularized by Dr. [name] when he came to our clinic with atherectomy of the left SFA and popliteal artery. He was also sent to interventional radiology for venous reflux studies. He was not felt to have significant reflux but certainly has chronic venous changes of his skin with hemosiderin deposition around this area. He will definitely need to lubricate his skin and wear compression stocking and I've talked to him about this. READMISSION 05/26/2018 This is a now 71 year old man we cared for with traumatic wounds on his left anterior lower extremity. He had been previously revascularized during that admission by Dr. [name]. Apparently in follow-up Dr. [name] noted that he had deterioration in his arterial status. He underwent a stent placement in the distal left SFA on 04/22/2018. 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 He went back to the angiography suite on 04/30/2018 he underwent PTA and balloon angioplasty of the occluded left mid anterior tibial artery, thrombotic occlusion went from 100 to 0% which reconstitutes the posterior tibial artery. He had thrombectomy  and aspiration of the peroneal artery. The stent placed in the distal SFA left SFA was still occluded. 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 He has been recuperating at [place] [facility] in [facility] after the most recent hospitalization. He is going home tomorrow. He tells me that 2 weeks ago he traumatized the tip of his left fifth toe. He came in urgently for our review of this. This was a history of before I noted that Dr. [name] had already noted dry gangrenous changes of the left fifth toe 06/09/2018; 2-week follow-up. I did contact Dr. [name] after his last appointment and he apparently saw 1 of Dr. Irven Shelling colleagues the next day. He does not follow-up with Dr. [name] himself until Thursday of this week. 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 Nevertheless there is no evidence of infection no drainage and no pain. He had a new area that this week when we were signing him in today on the left anterior mid tibia area, this is in close proximity to the previous wound we have dealt with in this clinic. 06/23/2018; 2-week follow-up. I did not receive a recent note from Dr. [name] to review today. Our office is trying to obtain this. He is apparently not planning to do further vascular interventions and wondered about compression to try and help with the patient's chronic venous insufficiency. However we are also concerned about the arterial flow. ooHe arrives in clinic today with a new area on the left third toe. The areas on the calf/anterior tibia are close to closing. The  left fifth toe is still mummified using Betadine. -In reviewing things with the patient he has what sounds like claudication with mild to moderate  amount of activity. 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 I prescribed Levaquin over the phone while we attempted to arrange a plan of care. However the patient called yesterday to report he had low-grade fever and he came in today acutely. There is been a marked deterioration in the left third toe with spreading cellulitis up into the dorsal left foot. He was referred to the emergency room. Readmission: 06/29/2020 patient presents today for reevaluation here in our clinic he was previously treated by Dr. Dellia Nims at the latter part of 2019 in 2 the beginning of 2020. Subsequently we have not seen him since that time in the interim he did have evaluation with vein and vascular specialist specifically Dr. Anice Paganini who did perform quite extensive work for a left femoral to anterior tibial artery bypass. With that being said in the interim the patient has developed significant lymphedema and has wounds that he tells me have really never healed in regard to the incision site on the left leg. He also has multiple wounds on the feet for various reasons some of which is that he tends to pick at his feet. Fortunately there is no signs of active infection systemically at this time he does have some wounds that are little bit deeper but most are fairly superficial he seems to have good blood flow and overall everything appears to be healthy I see no bone exposed and no obvious signs of osteomyelitis. I do not know that he necessarily needs a x-ray at this point although that something we could consider depending on how things progress. The patient does have a history of lymphedema, diabetes, this is type II, chronic kidney disease stage III, hypertension, and history of peripheral vascular disease. 07/05/2020; patient admitted last week. Is a patient I remember from 2019 he had a spreading infection involving the left foot and we sent him to the hospital. 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 He subsequently had a left femoral to anterior tibial bypass by Dr.Cain vein and vascular. He also has severe lymphedema with chronic skin changes related to that on the left leg. The most problematic area that was new today was on the left medial great toe. This was apparently a small area last week there was purulent drainage which our intake nurse cultured. Also areas on the left medial foot and heel left lateral foot. He has 2 areas on the left medial calf left lateral calf in the setting of the severe lymphedema. 07/13/2020 on evaluation today patient appears to be doing better in my opinion compared to his last visit. The good news is there is no signs of active infection systemically and locally I do not see any signs of infection either. He did have an x-ray which was negative that is great news he had a culture which showed MRSA but at the same time he is been on the doxycycline which has helped. I do think we may want to extend this for 7 additional days 1/25; patient admitted to the clinic a few weeks ago. 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 We have been putting him under compression his edema control is a lot better but he is severe verricused skin on the left  leg. He is really done quite well he still has an open area on the left medial calf and the left medial first metatarsal head. We have been using silver collagen on the leg silver alginate on the foot 07/27/2020 upon evaluation today patient appears to be doing decently well in regard to his wounds. He still has a lot of dry skin on the left leg. Some of this is starting to peel back and I think he may be able to have them out by removing some that today. Fortunately there is no signs of active infection at this time on the left leg although on the right leg he does appear to have swelling and erythema as well as some  mild warmth to touch. This does have been concerned about the possibility of cellulitis although within the differential diagnosis I do think that potentially a DVT has to be at least considered. We need to rule that out before proceeding would just call in the cellulitis. Especially since he is having pain in the posterior aspect of his calf muscle. 2/8; the patient had seen sparingly. He has severe skin changes of chronic lymphedema in the left leg thickened hyperkeratotic verrucous skin. He has an open wound on the medial part of the left first met head left mid tibia. He also has a rim of nonepithelialized skin in the anterior mid tibia. He brought in the AmLactin lotion that was been prescribed although I am not sure under compression and its utility. There concern about cellulitis on the right lower leg the last time he was here. He was put on on antibiotics. His DVT rule out was negative. The right leg looks fine he is using his stocking on this area 08/10/2020 upon evaluation today patient appears to be doing well with regard to his leg currently. He has been tolerating the dressing changes without complication. Fortunately there is no signs of active infection which is great news. Overall very pleased with where things stand. 2/22; the patient still has an area on the medial part of the left first met his head. This looks better than when I last saw this earlier this month he has a rim of epithelialization but still some surface debris. Mostly everything on the left leg is healed. There is still a vulnerable in the left mid tibia area. 08/30/2020 upon evaluation today patient appears to be doing much better in regard to his wounds on his foot. 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 Nonetheless I think we will address that today I Minna send in a  prescription for him in that regard. Overall though there does not appear to be any signs of significant worsening. 09/07/2020 on evaluation today patient's wounds over his left foot appear to be doing excellent. I do not see any signs of infection there is some callus buildup this can require debridement for certain but overall I feel like he is managing quite nicely. He still using the AmLactin cream which has been beneficial for him as well. 3/22; left foot wound is closed. There is no open area here. He is using ammonium lactate lotion to the lower extremities to help exfoliate dry cracked skin. He has compression stockings from elastic therapy in [place]. The wound on the medial part of his left first met head is healed today. READMISSION 04/12/2021 Mr. Bielefeld is a  patient we know fairly well he had a prolonged stay in clinic in 2019 with wounds on his left lateral and left anterior lower extremity in the setting of chronic venous insufficiency. More recently he was here earlier this year with predominantly an area on his left foot first metatarsal head plantar and he says the plantar foot broke down on its not long after we discharged him but he did not come back here. The last few months areas of broken down on his left anterior and again the left lateral lower extremity. The leg itself is very swollen chronically enlarged a lot of hyperkeratotic dry Berry Q skin in the left lower leg. His edema extends well into the thigh. He was seen by Dr. Donzetta Matters. He had ABIs on 03/02/2021 showing an ABI on the right of 1 with a TBI of 0.72 his ABI in the left at 1.09 TBI of 0.99. Monophasic and biphasic waveforms on the right. On the left monophasic waveforms were noted he went on to have an angiogram on 03/27/2021 this showed the aortic aortic and iliac segments were free of flow-limiting stenosis the left common femoral vein to evaluate the left femoral to anterior tibial artery bypass was unobstructed the  bypass was patent without any areas of stenosis. We discharged the patient in bilateral juxta lite stockings but very clearly that was not sufficient to control the swelling and maintain skin integrity. He is clearly going to need compression pumps. 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 This is fortunate because he is on his feet for long periods of time. 10/27; patient comes in with our intake nurse reporting copious amount of green drainage from the left anterior mid tibia the left dorsal foot and to a lesser extent the left medial mid tibia. We left the compression wrap on all week for the amount of edema in his left leg is quite a bit better. We use silver alginate as the primary dressing 11/3; edema control is good. Left anterior lower leg left medial lower leg and the plantar first metatarsal head. The left anterior lower leg required debridement. Deep tissue culture I did of this wound showed MRSA I put him on 10 days of doxycycline which she will start today. We have him in compression wraps. He has a security card and [employer] however he is retiring on November 15. We will need to then get him into a better offloading boot for the left foot perhaps a total contact cast 11/10; edema control is quite good. Left anterior and left medial lower leg wounds in the setting of chronic venous insufficiency and lymphedema. He also has a substantial area over the left plantar first metatarsal head. 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 He has significant hypergranulation on the left plantar foot wound. The patient is a diabetic but he does not have significant PAD 11/17; edema control is quite good. Left anterior and left medial lower leg wounds look better. The really concerning area remains the area on the left plantar first metatarsal head. He has a  rim of epithelialization. 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 Starting today with a forefoot off loader but . possibly a total  contact cast. 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 Arrives with a very significant odor from I think all of the wounds on the left leg and the left foot. Because of this I did not put a total contact cast on him today but will could still consider this. His wife was having cataract surgery which is the reason he missed the appointment 12/6. I saw this man 5 days ago with a swelling below the popliteal fossa. I thought he actually might have a Baker's cyst however the DVT rule out study that we could arrange right away was negative the technician told me this was not a ruptured Baker's cyst. We attempted to get this aspirated by under ultrasound guidance in interventional radiology however all they did was an ultrasound however it shows an extensive fluid collection 62 x 8 x 9.4 in the left thigh and left calf. The patient states he thinks this started 8 days ago or so but he really is not complaining of any pain, fever or systemic symptoms. He has not ha 12/20; after some difficulty I managed to get the patient into see Dr. Donzetta Matters. Eventually he was taken into the hospital and had a drain put in the fluid collection below his left knee posteriorly extending into the posterior thigh. He still has the drain in place. Culture of this showed moderate staff aureus few Morganella and few Klebsiella he is now on doxycycline and ciprofloxacin as suggested by infectious disease he is on this for a month. The drain will remain in place until it stops draining 12/29; he comes in today with the 1 wound on his left leg and the area on the left plantar first  met head significantly smaller. Both look healthy. He still has the drain in the left leg. He says he has to change this daily. Follows up with Dr. Donzetta Matters on January 11. 06/29/2021; the wounds that I am following on the left leg and left first met head continued to be quite healthy. However the area where his inferior drain is in place had copious amounts of drainage which was green in color. The wound here is larger. Follows up with Dr. Gwenlyn Saran of vein and vascular his surgeon next week as well as infectious disease. He remains on ciprofloxacin and doxycycline. He is not complaining of excessive pain in either one of the drain areas 1/12; the patient saw vascular surgery and infectious disease. Vascular surgery has left the drain in place as there was still some notable drainage still see him back in 2 weeks. Dr. Velna Ochs stop the doxycycline and ciprofloxacin and I do not believe he follows up with them at this point. Culture I did last week showed both doxycycline resistant MRSA and Pseudomonas not sensitive to ciprofloxacin although only in rare titers 1/19; the patient's wound on the left anterior lower leg is just about healed. We have continued healing of the area that was medially on the left leg. Left first plantar metatarsal head continues to get smaller. The major problem here is his 2 drain sites 1 on the left upper calf and lateral thigh. There is purulent drainage still from the left lateral thigh. I gave him antibiotics last week but we still have recultured. He has the drain in the area I think this is eventually going to have to come out. I suspect  there will be a connecting wound to heal here perhaps with improved VAc 1/26; the patient had his drain removed by vein and vascular on 1/25/. This was a large pocket of fluid in his left thigh that seem to tunnel into his left upper calf. 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 His mention his Penrose drain was removed today. He  now has a tunneling wound on his left calf and left thigh. Both of these probe widely towards each other although I cannot really prove that they connect. Both wounds on his lower leg anteriorly are closed and his area over the first metatarsal head on his right foot continues to improve. We are using Hydrofera Blue here. He also saw infectious disease culture of the abscess they noted was polymicrobial with MRSA, Morganella and Klebsiella he was treated with doxycycline and ciprofloxacin for 4 weeks ending on 07/03/2021. They did not recommend any further antibiotics. Notable that while he still had the Penrose drain in place last week he had purulent drainage coming out of the inferior IandD site this grew [facility], MRSA and Pseudomonas but there does not appear to be any active infection in this area today with the drain out and he is not systemically unwell 2/2; with regards to the drain sites the superior one on the thigh actually is closed down the one on the upper left lateral calf measures about 8 and half centimeters which is an improvement seems to be less prominent although still with a lot of drainage. The only remaining wound is over the first metatarsal head on the left foot and this looks to be continuing to improve with Hydrofera Blue. 2/9; the area on his plantar left foot continues to contract. Callus around the wound edge. The drain sites specifically have not come down in depth. We put the wound VAC on Monday he changed the canister late last night our intake nurse reported a pocket of fluid perhaps caused by our compression wraps 2/16; continued improvement in left foot plantar wound. drainage site in the calf is not improved in terms of depth (wound vac) 2/23; continued improvement in the left foot wound over the first metatarsal head. With regards to the drain sites the area on his thigh laterally is healed however the open area on his calf is small in terms of circumference  by still probes in by about 15 cm. Within using the wound VAC. Hydrofera Blue on his foot 08/24/2021: The left first metatarsal head wound continues to improve. The wound bed is healthy with just some surrounding callus. Unfortunately the open drain site on his calf remains open and tunnels at least 15 cm (the extent of a Q-tip). This is despite several weeks of wound VAC treatment. Based on reading back through the notes, there has been really no significant change in the depth of the wound, although the orifice is smaller and the more cranial wound on his thigh has closed. I suspect the tunnel tracks nearly all the way to this location. 08/31/2021: Continued improvement in the left first metatarsal head wound. There has been absolutely no improvement to the long tunnel from his open drain site on his calf. We have tried to get him into see vascular surgery sooner to consider the possibility of simply filleting the tract open and allowing it to heal from the bottom up, likely with a wound VAC. They have not yet scheduled a sooner appointment than his current mid April 09/14/2021: He  was seen by vascular surgery and they took him to the operating room last week. They opened a portion of the tunnel, but did not extend the entire length of the known open subcutaneous tract. I read Dr. Claretha Cooper operative note and it is not clear from that documentation why only a portion of the tract was opened. The heaped up granulation tissue was curetted and removed from at least some portion of the tract. They did place a wound VAC and applied an Unna boot to the leg. The ulcer on his left first metatarsal head is smaller today. The bed looks good and there is just a small amount of surrounding callus. 09/21/2021: The ulcer on his left first metatarsal head looks to be stalled. There is some callus surrounding the wound but the wound bed itself does not appear particularly dynamic. The tunnel tract on his lateral left leg  seems to be roughly the same length or perhaps slightly smaller but the wound bed appears healthy with good granulation tissue. He opened up a new wound on his medial thigh and the site of a prior surgical incision. He says that he did this unconsciously in his sleep by scratching. 09/28/2021: Unfortunately, the ulcer on his left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 with other specified severity Facility Procedures CPT4 Code: [id number] Description: [id number] - DEB SUBQ TISSUE 20 SQ CM/< ICD-10 Diagnosis Description 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 of other part of left foot with other specified severi Modifier: ty Quantity: 1 CPT4 Code: [id number] I Description: 40814 -  DEBRIDE WOUND 1ST 20 SQ CM OR < ICD-10 Diagnosis Description CD-10 Diagnosis Description 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veri Modifier: ty Quantity: 1 Physician Procedures : CPT4 Code Description Modifier [id number] - WC PHYS LEVEL 3 - EST PT 25 ICD-10 Diagnosis Description 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verity 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 of other part of  left foot with other specified severity 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

## 2022-03-08 ENCOUNTER — Encounter (HOSPITAL_BASED_OUTPATIENT_CLINIC_OR_DEPARTMENT_OTHER): Payer: Medicare Other | Admitting: General Surgery

## 2022-03-08 DIAGNOSIS — E11621 Type 2 diabetes mellitus with foot ulcer: Secondary | ICD-10-CM | POA: Diagnosis not present

## 2022-03-09 NOTE — Progress Notes (Signed)
SHEIKH, LEVERICH ([id number]) Visit Report for 03/08/2022 Chief Complaint Document Details Patient Name: Date of Service: DAVONE, SHINAULT 03/08/2022 8:15 A M Medical Record Number: [id number] Patient Account Number: [id number] Date of Birth/Sex: Treating RN: April 23, 1951 (71 y.o. [name] Primary Care Provider: [name] Other Clinician: Referring Provider: Treating Provider/Extender: [name] in Treatment: 75 Information Obtained from: Patient Chief Complaint Left leg and foot ulcers 04/12/2021; patient is here for wounds on his left lower leg and left plantar foot over the first metatarsal head Electronic Signature(s) Signed: 03/08/2022 9:10:45 AM By: Fredirick Maudlin MD FACS Entered By: Fredirick Maudlin on 03/08/2022 09:10:45 -------------------------------------------------------------------------------- Debridement Details Patient Name: Date of Service: [name] 03/08/2022 8:15 A M Medical Record Number: [id number] Patient Account Number: [id number] Date of Birth/Sex: Treating RN: [birth year] (71 y.o. [name], [name] Primary Care Provider: [name] Other Clinician: Referring Provider: Treating Provider/Extender: [name] in Treatment: 47 Debridement Performed for Assessment: Wound #22 Left,Lateral Lower Leg Performed By: Physician Fredirick Maudlin, MD Debridement Type: Debridement Level of Consciousness (Pre-procedure): Awake and Alert Pre-procedure Verification/Time Out Yes - 08:50 Taken: Start Time: 08:52 Pain Control: Lidocaine 4% T opical Solution T Area Debrided (L x W): otal 5 (cm) x 1.4 (cm) = 7 (cm) Tissue and other material debrided: Non-Viable, Slough, Biofilm, Slough Level: Non-Viable Tissue Debridement Description: Selective/Open Wound Instrument: Curette Bleeding: Minimum Hemostasis Achieved: Pressure Procedural Pain: 0 Post Procedural Pain: 0 Response to Treatment: Procedure was  tolerated well Level of Consciousness (Post- Awake and Alert procedure): Post Debridement Measurements of Total Wound Length: (cm) 9.5 Width: (cm) 1.4 Depth: (cm) 0.1 Volume: (cm) 1.045 Character of Wound/Ulcer Post Debridement: Improved Post Procedure Diagnosis Same as Pre-procedure Notes scribed by Baruch Gouty, RN for Dr. Celine Ahr Electronic Signature(s) Signed: 03/08/2022 5:22:18 PM By: Baruch Gouty RN, BSN Signed: 03/09/2022 10:03:44 AM By: Fredirick Maudlin MD FACS Previous Signature: 03/08/2022 10:03:28 AM Version By: Fredirick Maudlin MD FACS Entered By: Baruch Gouty on 03/08/2022 16:18:15 -------------------------------------------------------------------------------- Debridement Details Patient Name: Date of Service: [name] 03/08/2022 8:15 A M Medical Record Number: [id number] Patient Account Number: [id number] Date of Birth/Sex: Treating RN: Feb 16, 1951 (71 y.o. [name] Primary Care Provider: [name] Other Clinician: Referring Provider: Treating Provider/Extender: [name] in Treatment: 47 Debridement Performed for Assessment: Wound #27 Left,Medial Foot Performed By: Physician Fredirick Maudlin, MD Debridement Type: Debridement Severity of Tissue Pre Debridement: Fat layer exposed Level of Consciousness (Pre-procedure): Awake and Alert Pre-procedure Verification/Time Out Yes - 08:50 Taken: Start Time: 08:52 Pain Control: Lidocaine 4% T opical Solution T Area Debrided (L x W): otal 3.7 (cm) x 3.4 (cm) = 12.58 (cm) Tissue and other material debrided: Viable, Non-Viable, Slough, Subcutaneous, Skin: Epidermis, Slough Level: Skin/Subcutaneous Tissue Debridement Description: Excisional Instrument: Curette Bleeding: Minimum Hemostasis Achieved: Pressure Procedural Pain: 0 Post Procedural Pain: 0 Response to Treatment: Procedure was tolerated well Level of Consciousness (Post- Awake and  Alert procedure): Post Debridement Measurements of Total Wound Length: (cm) 3.7 Width: (cm) 3.4 Depth: (cm) 0.3 Volume: (cm) 2.964 Character of Wound/Ulcer Post Debridement: Improved Severity of Tissue Post Debridement: Fat layer exposed Post Procedure Diagnosis Same as Pre-procedure Notes scribed by Baruch Gouty, RN for Dr. Celine Ahr Electronic Signature(s) Signed: 03/08/2022 5:22:18 PM By: Baruch Gouty RN, BSN Signed: 03/09/2022 10:03:44 AM By: Fredirick Maudlin MD FACS Previous Signature: 03/08/2022 10:03:28 AM Version By: Fredirick Maudlin MD FACS Entered By: Baruch Gouty on 03/08/2022 16:18:25 -------------------------------------------------------------------------------- HPI Details  Patient Name: Date of Service: DEMETRIOUS, RAINFORD 03/08/2022 8:15 A M Medical Record Number: [id number] Patient Account Number: [id number] Date of Birth/Sex: Treating RN: 08-22-1950 (71 y.o. [name] Primary Care Provider: [name] Other Clinician: Referring Provider: Treating Provider/Extender: [name] in Treatment: 33 History of Present Illness HPI Description: 10/11/17; Mr. Iversen is a 71 year old man who tells me that in 2015 he slipped down the latter traumatizing his left leg. He developed a wound in the same spot the area that we are currently looking at. He states this closed over for the most part although he always felt it was somewhat unstable. In 2016 he hit the same area with the door of his car had this reopened. He tells me that this is never really closed although sometimes an inflow it remains open on a constant basis. He has not been using any specific dressing to this except for topical antibiotics the nature of which were not really sure. His primary doctor did send him to see Dr. [name] of interventional cardiology. He underwent an angiogram on 08/06/17 and he underwent a PTA and directional atherectomy of the lesser distal SFA and popliteal  arteries which resulted in brisk improvement in blood flow. It was noted that he had 2 vessel runoff through the anterior tibial and peroneal. He is also been to see vascular and interventional radiologist. He was not felt to have any significant superficial venous insufficiency. Presumably is not a candidate for any ablation. It was suggested he come here for wound care. The patient is a type II diabetic on insulin. He also has a history of venous insufficiency. ABIs on the left were noncompressible in our clinic 10/21/17; patient we admitted to the clinic last week. 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 We put Iodosorb on him after an aggressive debridement and 3 layer compression. He complained of pain in his ankle and itching with is skin in fact he scratched the area on the medial calf superiorly at the rim of our wraps and he has 2 small open areas in that location today which are new. I changed his primary dressing today to silver collagen. As noted he is already had revascularization and does not have any significant superficial venous insufficiency that would be amenable to ablation 10/28/17; patient admitted to the clinic 2 weeks ago. He has a smaller Wound. Scratch injury from last week revealed. There is large wound over the tibial area. This is smaller. Granulation looks healthy. No need for debridement. 11/04/17; the wound on the left lateral calf looks better. Improved dimensions. Surface of this looks better. We've been maintaining him and Kerlix Coban wraps. He finds this much more comfortable. Silver collagen dressing 11/11/17; left lateral Wound continues to look healthy be making progress. Using a #5 curet I removed removed nonviable skin from the surface of the wound and then necrotic debris from the wound surface. Surface of the wound continues to look healthy. He also has an open area on the left great toenail  bed. We've been using topical antibiotics. 11/19/17; left anterior lateral wound continues to look healthy but it's not closed. He also had a small wound above this on the left leg Initially traumatic wounds in the setting of significant chronic venous insufficiency and stasis dermatitis 11/25/17; left anterior wounds superiorly is closed still a small wound inferiorly. 12/02/17; left anterior tibial area. Arrives today with adherent  callus. Post debridement clearly not completely closed. Hydrofera Blue under 3 layer compression. 12/09/17; left anterior tibia. Circumferential eschar however the wound bed looks stable to improved. We've been using Hydrofera Blue under 3 layer compression 12/17/17; left anterior tibia. Apparently this was felt to be closed however when the wrap was taken off there is a skin tear to reopen wounds in the same area we've been using Hydrofera Blue under 3 layer compression 12/23/17 left anterior tibia. Not close to close this week apparently the [facility] was stuck to this again. Still circumferential eschar requiring debridement. I put a contact layer on this this time under the Hydrofera Blue 12/31/17; left anterior tibia. Wound is better slight amount of hyper-granulation. Using Hydrofera Blue over Adaptic. 01/07/18; left anterior tibia. The wound had some surface eschar however after this was removed he has no open wound.he was already revascularized by Dr. [name] when he came to our clinic with atherectomy of the left SFA and popliteal artery. He was also sent to interventional radiology for venous reflux studies. He was not felt to have significant reflux but certainly has chronic venous changes of his skin with hemosiderin deposition around this area. He will definitely need to lubricate his skin and wear compression stocking and I've talked to him about this. READMISSION 05/26/2018 This is a now 71 year old man we cared for with traumatic wounds on his left anterior  lower extremity. He had been previously revascularized during that admission by Dr. [name]. Apparently in follow-up Dr. [name] noted that he had deterioration in his arterial status. He underwent a stent placement in the distal left SFA on 04/22/2018. 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 He went back to the angiography suite on 04/30/2018 he underwent PTA and balloon angioplasty of the occluded left mid anterior tibial artery, thrombotic occlusion went from 100 to 0% which reconstitutes the posterior tibial artery. He had thrombectomy and aspiration of the peroneal artery. The stent placed in the distal SFA left SFA was still occluded. 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 He has been recuperating at [place] home in [facility] after the most recent hospitalization. He is going home tomorrow. He tells me that 2 weeks ago he traumatized the tip of his left fifth toe. He came in urgently for our review of this. This was a history of before I noted that Dr. [name] had already noted dry gangrenous changes of the left fifth toe 06/09/2018; 2-week follow-up. I did contact Dr. [name] after his last appointment and he apparently saw 1 of Dr. Irven Shelling colleagues the next day. He does not follow-up with Dr. [name] himself until Thursday of this week. 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 Nevertheless there is no evidence of infection no drainage and no pain. He had a new area that this week when we were signing him in today on the left anterior mid tibia area, this is in close proximity to the previous wound we have dealt with in this clinic. 06/23/2018; 2-week follow-up. I did not receive a recent note from Dr.  [name] to review today. Our office is trying to obtain this. He is apparently not planning to do further vascular interventions and  wondered about compression to try and help with the patient's chronic venous insufficiency. However we are also concerned about the arterial flow. He arrives in clinic today with a new area on the left third toe. The areas on the calf/anterior tibia are close to closing. The left fifth toe is still mummified using Betadine. -In reviewing things with the patient he has what sounds like claudication with mild to moderate amount of activity. 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 I prescribed Levaquin over the phone while we attempted to arrange a plan of care. However the patient called yesterday to report he had low-grade fever and he came in today acutely. There is been a marked deterioration in the left third toe with spreading cellulitis up into the dorsal left foot. He was referred to the emergency room. Readmission: 06/29/2020 patient presents today for reevaluation here in our clinic he was previously treated by Dr. Dellia Nims at the latter part of 2019 in 2 the beginning of 2020. Subsequently we have not seen him since that time in the interim he did have evaluation with vein and vascular specialist specifically Dr. Anice Paganini who did perform quite extensive work for a left femoral to anterior tibial artery bypass. With that being said in the interim the patient has developed significant lymphedema and has wounds that he tells me have really never healed in regard to the incision site on the left leg. He also has multiple wounds on the feet for various reasons some of which is that he tends to pick at his feet. Fortunately there is no signs of active infection systemically at this time he does have some wounds that are little bit deeper but most are fairly superficial he seems to have good blood flow and overall everything appears to  be healthy I see no bone exposed and no obvious signs of osteomyelitis. I do not know that he necessarily needs a x-ray at this point although that something we could consider depending on how things progress. The patient does have a history of lymphedema, diabetes, this is type II, chronic kidney disease stage III, hypertension, and history of peripheral vascular disease. 07/05/2020; patient admitted last week. Is a patient I remember from 2019 he had a spreading infection involving the left foot and we sent him to the hospital. 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 He subsequently had a left femoral to anterior tibial bypass by Dr.Cain vein and vascular. He also has severe lymphedema with chronic skin changes related to that on the left leg. The most problematic area that was new today was on the left medial great toe. This was apparently a small area last week there was purulent drainage which our intake nurse cultured. Also areas on the left medial foot and heel left lateral foot. He has 2 areas on the left medial calf left lateral calf in the setting of the severe lymphedema. 07/13/2020 on evaluation today patient appears to be doing better in my opinion compared to his last visit. The good news is there is no signs of active infection systemically and locally I do not see any signs of infection either. He did have an x-ray which was negative that is great news he had a culture which showed MRSA but at the same time he is been on the doxycycline which has helped. I do think we may want to extend this for 7 additional days 1/25;  patient admitted to the clinic a few weeks ago. 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 We have been putting him under compression his edema control is a lot better but he is severe verricused skin on the left leg. He is really done quite well he still has an open area on the left  medial calf and the left medial first metatarsal head. We have been using silver collagen on the leg silver alginate on the foot 07/27/2020 upon evaluation today patient appears to be doing decently well in regard to his wounds. He still has a lot of dry skin on the left leg. Some of this is starting to peel back and I think he may be able to have them out by removing some that today. Fortunately there is no signs of active infection at this time on the left leg although on the right leg he does appear to have swelling and erythema as well as some mild warmth to touch. This does have been concerned about the possibility of cellulitis although within the differential diagnosis I do think that potentially a DVT has to be at least considered. We need to rule that out before proceeding would just call in the cellulitis. Especially since he is having pain in the posterior aspect of his calf muscle. 2/8; the patient had seen sparingly. He has severe skin changes of chronic lymphedema in the left leg thickened hyperkeratotic verrucous skin. He has an open wound on the medial part of the left first met head left mid tibia. He also has a rim of nonepithelialized skin in the anterior mid tibia. He brought in the AmLactin lotion that was been prescribed although I am not sure under compression and its utility. There concern about cellulitis on the right lower leg the last time he was here. He was put on on antibiotics. His DVT rule out was negative. The right leg looks fine he is using his stocking on this area 08/10/2020 upon evaluation today patient appears to be doing well with regard to his leg currently. He has been tolerating the dressing changes without complication. Fortunately there is no signs of active infection which is great news. Overall very pleased with where things stand. 2/22; the patient still has an area on the medial part of the left first met his head. This looks better than when I last saw this  earlier this month he has a rim of epithelialization but still some surface debris. Mostly everything on the left leg is healed. There is still a vulnerable in the left mid tibia area. 08/30/2020 upon evaluation today patient appears to be doing much better in regard to his wounds on his foot. 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 Nonetheless I think we will address that today I Minna send in a prescription for him in that regard. Overall though there does not appear to be any signs of significant worsening. 09/07/2020 on evaluation today patient's wounds over his left foot appear to be doing excellent. I do not see any signs of infection there is some callus buildup this can require debridement for certain but overall I feel like he is managing quite nicely. He still using the AmLactin cream which has been beneficial for him as well. 3/22; left foot wound is closed. There is no open area here.  He is using ammonium lactate lotion to the lower extremities to help exfoliate dry cracked skin. He has compression stockings from elastic therapy in [place]. The wound on the medial part of his left first met head is healed today. READMISSION 04/12/2021 Mr. Reisz is a patient we know fairly well he had a prolonged stay in clinic in 2019 with wounds on his left lateral and left anterior lower extremity in the setting of chronic venous insufficiency. More recently he was here earlier this year with predominantly an area on his left foot first metatarsal head plantar and he says the plantar foot broke down on its not long after we discharged him but he did not come back here. The last few months areas of broken down on his left anterior and again the left lateral lower extremity. The leg itself is very swollen chronically enlarged a lot of hyperkeratotic dry Berry Q skin in  the left lower leg. His edema extends well into the thigh. He was seen by Dr. Donzetta Matters. He had ABIs on 03/02/2021 showing an ABI on the right of 1 with a TBI of 0.72 his ABI in the left at 1.09 TBI of 0.99. Monophasic and biphasic waveforms on the right. On the left monophasic waveforms were noted he went on to have an angiogram on 03/27/2021 this showed the aortic aortic and iliac segments were free of flow-limiting stenosis the left common femoral vein to evaluate the left femoral to anterior tibial artery bypass was unobstructed the bypass was patent without any areas of stenosis. We discharged the patient in bilateral juxta lite stockings but very clearly that was not sufficient to control the swelling and maintain skin integrity. He is clearly going to need compression pumps. 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 This is fortunate because he is on his feet for long periods of time. 10/27; patient comes in with our intake nurse reporting copious amount of green drainage from the left anterior mid tibia the left dorsal foot and to a lesser extent the left medial mid tibia. We left the compression wrap on all week for the amount of edema in his left leg is quite a bit better. We use silver alginate as the primary dressing 11/3; edema control is good. Left anterior lower leg left medial lower leg and the plantar first metatarsal head. The left anterior lower leg required debridement. Deep tissue culture I did of this wound showed MRSA I put him on 10 days of doxycycline which she will start today. We have him in compression wraps. He has a security card and [employer] however he is retiring on November 15. We will need to then get him into a better offloading boot for the left foot perhaps a total contact cast 11/10; edema control is quite good. Left anterior and left medial lower leg wounds in the setting of chronic venous insufficiency and lymphedema.  He also has a substantial area over the left plantar first metatarsal head. 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 He has significant hypergranulation on the left plantar foot wound. The patient is a diabetic but he does not have significant PAD 11/17; edema control is quite good. Left anterior and left medial lower leg wounds look better. The really concerning area remains the area on the left plantar first metatarsal head. He  has a rim of epithelialization. 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 Starting today with a forefoot off loader but . possibly a total contact cast. 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 Arrives with a very significant odor from I think all of the wounds on the left leg and the left foot. Because of this I did not put a total contact cast on him today but will could still consider this. His wife was having cataract surgery which is the reason he missed the appointment 12/6. I saw this man 5 days ago with a swelling below the popliteal fossa. I thought he actually might have a Baker's cyst however the DVT rule out study that we could arrange right away was negative the technician told me this was not a ruptured Baker's cyst. We attempted to get this aspirated by under ultrasound guidance in interventional radiology however all they did was an ultrasound however it shows an extensive fluid collection 62 x 8 x 9.4 in the left thigh and left calf. The patient states he thinks this started 8 days ago or so but he really is not complaining of any pain, fever or systemic symptoms. He has not ha 12/20; after some difficulty I managed to get the patient into see  Dr. Donzetta Matters. Eventually he was taken into the hospital and had a drain put in the fluid collection below his left knee posteriorly extending into the posterior thigh. He still has the drain in place. Culture of this showed moderate staff aureus few Morganella and few Klebsiella he is now on doxycycline and ciprofloxacin as suggested by infectious disease he is on this for a month. The drain will remain in place until it stops draining 12/29; he comes in today with the 1 wound on his left leg and the area on the left plantar first met head significantly smaller. Both look healthy. He still has the drain in the left leg. He says he has to change this daily. Follows up with Dr. Donzetta Matters on January 11. 06/29/2021; the wounds that I am following on the left leg and left first met head continued to be quite healthy. However the area where his inferior drain is in place had copious amounts of drainage which was green in color. The wound here is larger. Follows up with Dr. Gwenlyn Saran of vein and vascular his surgeon next week as well as infectious disease. He remains on ciprofloxacin and doxycycline. He is not complaining of excessive pain in either one of the drain areas 1/12; the patient saw vascular surgery and infectious disease. Vascular surgery has left the drain in place as there was still some notable drainage still see him back in 2 weeks. Dr. Velna Ochs stop the doxycycline and ciprofloxacin and I do not believe he follows up with them at this point. Culture I did last week showed both doxycycline resistant MRSA and Pseudomonas not sensitive to ciprofloxacin although only in rare titers 1/19; the patient's wound on the left anterior lower leg is just about healed. We have continued healing of the area that was medially on the left leg. Left first plantar metatarsal head continues to get smaller. The major problem here is his 2 drain sites 1 on the left  upper calf and lateral thigh. There is purulent drainage still from  the left lateral thigh. I gave him antibiotics last week but we still have recultured. He has the drain in the area I think this is eventually going to have to come out. I suspect there will be a connecting wound to heal here perhaps with improved VAc 1/26; the patient had his drain removed by vein and vascular on 1/25/. This was a large pocket of fluid in his left thigh that seem to tunnel into his left upper calf. 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 His mention his Penrose drain was removed today. He now has a tunneling wound on his left calf and left thigh. Both of these probe widely towards each other although I cannot really prove that they connect. Both wounds on his lower leg anteriorly are closed and his area over the first metatarsal head on his right foot continues to improve. We are using Hydrofera Blue here. He also saw infectious disease culture of the abscess they noted was polymicrobial with MRSA, Morganella and Klebsiella he was treated with doxycycline and ciprofloxacin for 4 weeks ending on 07/03/2021. They did not recommend any further antibiotics. Notable that while he still had the Penrose drain in place last week he had purulent drainage coming out of the inferior IandD site this grew [facility], MRSA and Pseudomonas but there does not appear to be any active infection in this area today with the drain out and he is not systemically unwell 2/2; with regards to the drain sites the superior one on the thigh actually is closed down the one on the upper left lateral calf measures about 8 and half centimeters which is an improvement seems to be less prominent although still with a lot of drainage. The only remaining wound is over the first metatarsal head on the left foot and this looks to be continuing to improve with Hydrofera Blue. 2/9; the area on his plantar left foot continues to contract. Callus around the wound edge. The drain sites specifically have  not come down in depth. We put the wound VAC on Monday he changed the canister late last night our intake nurse reported a pocket of fluid perhaps caused by our compression wraps 2/16; continued improvement in left foot plantar wound. drainage site in the calf is not improved in terms of depth (wound vac) 2/23; continued improvement in the left foot wound over the first metatarsal head. With regards to the drain sites the area on his thigh laterally is healed however the open area on his calf is small in terms of circumference by still probes in by about 15 cm. Within using the wound VAC. Hydrofera Blue on his foot 08/24/2021: The left first metatarsal head wound continues to improve. The wound bed is healthy with just some surrounding callus. Unfortunately the open drain site on his calf remains open and tunnels at least 15 cm (the extent of a Q-tip). This is despite several weeks of wound VAC treatment. Based on reading back through the notes, there has been really no significant change in the depth of the wound, although the orifice is smaller and the more cranial wound on his thigh has closed. I suspect the tunnel tracks nearly all the way to this location. 08/31/2021: Continued improvement in the left first metatarsal head wound. There has been absolutely no improvement to the long tunnel from his open drain site on his calf. We have tried to  get him into see vascular surgery sooner to consider the possibility of simply filleting the tract open and allowing it to heal from the bottom up, likely with a wound VAC. They have not yet scheduled a sooner appointment than his current mid April 09/14/2021: He was seen by vascular surgery and they took him to the operating room last week. They opened a portion of the tunnel, but did not extend the entire length of the known open subcutaneous tract. I read Dr. Claretha Cooper operative note and it is not clear from that documentation why only a portion of the tract was  opened. The heaped up granulation tissue was curetted and removed from at least some portion of the tract. They did place a wound VAC and applied an Unna boot to the leg. The ulcer on his left first metatarsal head is smaller today. The bed looks good and there is just a small amount of surrounding callus. 09/21/2021: The ulcer on his left first metatarsal head looks to be stalled. There is some callus surrounding the wound but the wound bed itself does not appear particularly dynamic. The tunnel tract on his lateral left leg seems to be roughly the same length or perhaps slightly smaller but the wound bed appears healthy with good granulation tissue. He opened up a new wound on his medial thigh and the site of a prior surgical incision. He says that he did this unconsciously in his sleep by scratching. 09/28/2021: Unfortunately, the ulcer on his left first metatarsal head has extended underneath the callus toward the dorsum of his foot. The medial thigh wounds are roughly the same. The tunnel on his lateral left leg continues to be problematic; it is longer than we are able to actually probe with a Q-tip. I am still not certain as to why Dr. Donzetta Matters did not open this up entirely when he took the patient to the operating room. We will likely be back in the same situation with just a small superficial opening in a long unhealed tract, as the open portion is granulating in nicely. 10/02/2021: The patient was initially scheduled for a nurse visit, but we are also applying a total contact cast today. The plantar foot wound looks clean without significant accumulated callus. We have been applying Prisma silver collagen to the site. 10/05/2021: The patient is here for his first total contact cast change. We have tried using gauze packing strips in the tunnel on his lateral leg wound, but this does not seem to be working any better than the white VAC foam. The foot ulcer looks about the same with minimal periwound  callus. Medial thigh wound is clean with just some overlying eschar. 10/12/2021: The plantar foot wound is stable without any significant accumulation of periwound callus. The surface is viable with good granulation tissue. The medial thigh wounds are much smaller and are epithelializing. On the other hand, he had purulent drainage coming from the tunnel on his lateral leg. He does go back to see Dr. Donzetta Matters next week and is planning to ask him why the wound tunnel was not completely opened at the time of his most recent operation. 10/19/2021: The plantar foot wound is markedly improved and has epithelial tissue coming through the surface. The medial thigh wounds are nearly closed with just a tiny open area. He did see Dr. Donzetta Matters earlier this week and apparently they did discuss the possibility of opening the sinus tract further and enabling a wound VAC application. Apparently there are some  limits as to what Dr. 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 I think if we could get the tract open to the level of the popliteal fossa, this would greatly aid in her ability to get this chart closed. That being said, however, today when I probed the tract with a Q-tip, I was not able to insert the entirety of the Q-tip as I have on previous occasions. The tunnel is shorter by about 4 cm. The surface is clean with good granulation tissue and no further episodes of purulent drainage. 10/30/2021: Last week, the patient underwent surgery and had the long tract in his leg opened. There was a rind that was debrided, according to the operative report. His medial thigh ulcers are closed. The plantar foot wound is clean with a good surface and some built up surrounding callus. 11/06/2021: The overall dimensions of the large wound on his lateral leg remain about the same, but there is good granulation tissue present and the tunneling is a little bit shorter. He has a new wound on his anterior  tibial surface, in the same location where he had a similar lesion in the past. The plantar foot wound is clean with some buildup surrounding callus. Just toward the medial aspect of his foot, however, there is an area of darkening that once debrided, revealed another opening in the skin surface. 11/13/2021: The anterior tibial surface wound is closed. The plantar foot wound has some surrounding callus buildup. The area of darkening that I debrided last week and revealed an opening in the skin surface has closed again. The tunnel in the large wound on his lateral leg has come in by about 3 cm. There is healthy granulation tissue on the entire wound surface. 11/23/2021: The patient was out of town last week and did wet-to-dry dressings on his large wound. 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 Unfortunately, he picked open the wound on his left medial thigh. He says that it was itching and he just could not stop scratching it until it was open again. The wound on his plantar foot is smaller and has not accumulated a tremendous amount of callus. The lateral leg wound is shallower and the tunnel has also decreased in depth. There is just a little bit of slough accumulation on the surface. 11/30/2021: Another portion of his left medial thigh has been opened up. All of these wounds are fairly superficial with just a little bit of slough and eschar accumulation. The wound on his plantar foot is almost closed with just a bit of eschar and periwound callus accumulation. The lateral leg wound is nearly flush with the surrounding skin and the tunnel is markedly shallower. 12/07/2021: There is just 1 open area on his left medial thigh. It is clean with just a little bit of perimeter eschar. The wound on his plantar foot continues to contract and just has some eschar and periwound callus accumulation. The lateral leg wound is closing at the more  distal aspect and the tunnel is smaller. The surface is nearly flush with the surrounding skin and it has a good bed of granulation tissue. 12/14/2021: The thigh and foot wounds are closed. The lateral leg wound has closed over approximately half of its length. The tunnel continues to contract and the surface is now flush with the surrounding skin. The wound bed has robust granulation tissue. 12/22/2021: The thigh and foot wounds  have reopened. The foot wound has a lot of callus accumulation around and over it. The thigh wound is tiny with just a little bit of slough in the wound bed. The lateral leg wound continues to contract. His vascular surgeon took the wound VAC off earlier in the week and the patient has been doing wet-to-dry dressings. There is a little slough accumulation on the surface. The tunnel is about 3 cm in depth at this point. 12/28/2021: The thigh wound is closed again. The foot wound has some callus that subsequently has peeled back exposing just a small slit of a wound. The lateral leg wound Is down to about half the size that it originally was and the tunnel is down to about half a centimeter in depth. 01/04/2022: The thigh wound remains closed. The foot wound has heavy callus overlying the wound site. Once this was debrided, the wound was found to be closed. The lateral leg wound is smaller again this week and very superficial. No tunnel could be identified. 01/12/2022: The thigh and foot wounds both remain closed. The lateral leg wound is now nearly flush with the skin surface. There is good granulation tissue present with a light layer of slough. 01/19/2022: Due to the way his wrap was placed, the patient did not change the dressing on his thigh at all and so the foam was saturated and his skin is macerated. There is a light layer of slough on the wound surface. The underlying granulation tissue is robust and healthy-appearing. He has heavy callus buildup at the site of his first  metatarsal head wound which is still healed. 02/01/2022: He has been in silver alginate. When he removed the dressing from his thigh wound, however, some leg, superficially reopening a portion of the wound that had healed. In addition, underneath the callus at his left first metatarsal head, there appears to be a blister and the wound appears to be open again. 02/08/2022: The lateral leg wound has contracted substantially. There is eschar and a light layer of slough present. He says that it is starting to pull and is uncomfortable. On inspection, there is some puckering of the scar and the eschar is quite dry; this may account for his symptoms. On his first metatarsal head, the wound is much smaller with just some eschar on the surface. The callus has not reaccumulated. He reports that he had a blister come up on his medial thigh wound at the distal aspect. It popped and there is now an opening in his skin again. Looking back through his [place] of wound photos, there is what looks like a permanent suture just deep to this location and it may be trying to erode through. We have been using silver alginate on his wounds. 02/15/2022: The lateral leg wound is about half the size it was last week. It is clean with just a little perimeter eschar and light slough. The wound on his first metatarsal head is about the same with heavy callus overlying it. The medial thigh wound is closed again. He does have some skin changes on the top of his foot that looks potentially yeast related. 02/22/2022: The skin on the top of his foot improved with the use of a topical antifungal. The lateral leg wound continues to contract and is again smaller this week. There is a little bit of slough and eschar on the surface. The first metatarsal head wound is a little bit smaller but has reaccumulated a thick callus over the top. He  decided to try to trim his toenail and ultimately took the entire nail off of his left great  toe. 03/02/2022: His lateral leg wound continues to improve, as does the wound on his left great toe. Unfortunately, it appears that somehow his foot got wet and moisture seeped in through the opening causing his skin to lift. There is a large wound now overlying his first metatarsal on both the plantar, medial, and dorsal portion of his foot. There is necrotic tissue and slough present underneath the shaggy macerated skin. 03/08/2022: The lateral leg wound is smaller again today. There is just a light layer of slough and eschar on the surface. The great toe wound is smaller again today. The first metatarsal wound is a little bit smaller today and does not look nearly as necrotic and macerated. There is still slough and nonviable tissue present. Electronic Signature(s) Signed: 03/08/2022 9:11:51 AM By: Fredirick Maudlin MD FACS Entered By: Fredirick Maudlin on 03/08/2022 09:11:51 -------------------------------------------------------------------------------- Physical Exam Details Patient Name: Date of Service: [name] 03/08/2022 8:15 A M Medical Record Number: [id number] Patient Account Number: [id number] Date of Birth/Sex: Treating RN: 06/20/1951 (71 y.o. [name] Primary Care Provider: [name] Other Clinician: Referring Provider: Treating Provider/Extender: [name] in Treatment: 76 Constitutional Hypertensive, asymptomatic. . . . No acute distress.Marland Kitchen Respiratory Normal work of breathing on room air.. Notes 03/08/2022: The lateral leg wound is smaller again today. There is just a light layer of slough and eschar on the surface. The great toe wound is smaller again today. The first metatarsal wound is a little bit smaller today and does not look nearly as necrotic and macerated. There is still slough and nonviable tissue present. Electronic Signature(s) Signed: 03/08/2022 9:12:37 AM By: Fredirick Maudlin MD FACS Entered By: Fredirick Maudlin on  03/08/2022 09:12:37 -------------------------------------------------------------------------------- Physician Orders Details Patient Name: Date of Service: [name] 03/08/2022 8:15 A M Medical Record Number: [id number] Patient Account Number: [id number] Date of Birth/Sex: Treating RN: 26-May-1951 (71 y.o. 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 - Dr Celine Ahr - Room 1 Return A Anesthetic Wound #22 Left,Lateral Lower Leg (In clinic) Topical Lidocaine 4% applied to wound bed Bathing/ Shower/ Hygiene May shower with protection but do not get wound dressing(s) wet. - Use a cast protector so you can shower without getting your wrap(s) wet Edema Control - Lymphedema / SCD / Other Elevate legs to the level of the heart or above for 30 minutes daily and/or when sitting, a frequency of: - throughout the day Avoid standing for long periods of time. Patient to wear own compression stockings every day. - on right leg; Moisturize legs daily. - Ammonium lactate to right leg daily Off-Loading Total Contact Cast to Left Lower Extremity Wound Treatment Wound #22 - Lower Leg Wound Laterality: Left, Lateral Cleanser: Soap and Water 3 x Per Week/30 Days Discharge Instructions: May shower and wash wound with dial antibacterial soap and water prior to  dressing  change. Cleanser: Wound Cleanser 3 x Per Week/30 Days Discharge Instructions: Cleanse the wound with wound cleanser prior to applying a clean dressing using gauze sponges, not tissue or cotton balls. Peri-Wound Care: Sween Lotion (Moisturizing lotion) 3 x Per Week/30 Days Discharge Instructions: Apply moisturizing lotion to the leg Prim Dressing: KerraCel Ag Gelling Fiber Dressing, 4x5 in (silver alginate) 3 x Per Week/30 Days ary Discharge Instructions: Apply silver alginate to wound bed as instructed Secondary Dressing: ABD Pad, 8x10 (Generic) 3 x Per Week/30 Days Discharge Instructions: Apply over primary dressing as directed. Secured With: Elastic Bandage 4 inch (ACE bandage) 3 x Per Week/30 Days Discharge Instructions: Secure with ACE bandage as directed. Wound #27 - Foot Wound Laterality: Left, Medial Peri-Wound Care: Sween Lotion (Moisturizing lotion) 1 x Per Week Discharge Instructions: Apply moisturizing lotion as directed Topical: Ketoconazole Cream 2% 1 x Per Week Discharge Instructions: Apply Ketoconazole to top of foot Prim Dressing: Hydrofera Blue Classic Foam, 4x4 in 1 x Per Week ary Discharge Instructions: Moisten with saline prior to applying to wound bed Secondary Dressing: Woven Gauze Sponges 2x2 in 1 x Per Week Discharge Instructions: Apply over primary dressing as directed. Secondary Dressing: Zetuvit Plus 4x8 in 1 x Per Week Discharge Instructions: Apply over primary dressing as directed. Secured With: 57M Medipore H Soft Cloth Surgical T ape, 4 x 10 (in/yd) 1 x Per Week Discharge Instructions: Secure with tape as directed. Electronic Signature(s) Signed: 03/08/2022 10:03:28 AM By: Fredirick Maudlin MD FACS Entered By: Fredirick Maudlin on 03/08/2022 09:13:13 -------------------------------------------------------------------------------- Problem List Details Patient Name: Date of Service: [name] 03/08/2022 8:15 A M Medical Record Number:  [id number] Patient Account Number: [id number] Date of Birth/Sex: Treating RN: 1951/02/25 (71 y.o. [name] Primary Care Provider: [name] Other Clinician: Referring Provider: Treating Provider/Extender: [name] in Treatment: 37 Active Problems ICD-10 Encounter Code Description Active Date MDM Diagnosis 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 04/12/2021 No Yes I89.0 Lymphedema, not elsewhere classified 04/12/2021 No Yes I87.322 Chronic venous hypertension (idiopathic) with inflammation of left lower 04/12/2021 No Yes extremity 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 ulcer of other part of left lower leg with other specified 04/12/2021 No Yes severity 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 of other part of left foot with other specified 04/12/2021 No Yes severity Inactive Problems ICD-10 Code Description Active Date Inactive Date E11.621 Type 2 diabetes mellitus with foot ulcer 04/12/2021 04/12/2021 E11.42 Type 2 diabetes mellitus with diabetic polyneuropathy 04/12/2021 04/12/2021 L02.416 Cutaneous abscess of left lower limb 06/13/2021 06/13/2021 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 03/08/2022 8:15 A M Medical Record Number: [id number] Patient Account Number: [id number] Date of Birth/Sex: Treating RN: 1950-11-21 (71 y.o. [name] Primary Care Provider: [name] Other Clinician: Referring Provider: Treating Provider/Extender: [name] in Treatment: 70 Subjective Chief Complaint Information obtained from  Patient Left leg and foot ulcers 04/12/2021; patient is here for wounds on his left lower leg and left plantar foot over the first metatarsal head History of Present Illness (HPI) 10/11/17; Mr. Drewes is a 71 year old man who tells me that in 2015 he slipped down the latter traumatizing his left leg. He developed a wound in the same spot the area that we are currently looking  at. He states this closed over for the most part although he always felt it was somewhat unstable. In 2016 he hit the same area with the door of his car had this reopened. He tells me that this is never really closed although sometimes an inflow it remains open on a constant basis. He has not been using any specific dressing to this except for topical antibiotics the nature of which were not really sure. His primary doctor did send him to see Dr. [name] of interventional cardiology. He underwent an angiogram on 08/06/17 and he underwent a PTA and directional atherectomy of the lesser distal SFA and popliteal arteries which resulted in brisk improvement in blood flow. It was noted that he had 2 vessel runoff through the anterior tibial and peroneal. He is also been to see vascular and interventional radiologist. He was not felt to have any significant superficial venous insufficiency. Presumably is not a candidate for any ablation. It was suggested he come here for wound care. The patient is a type II diabetic on insulin. He also has a history of venous insufficiency. ABIs on the left were noncompressible in our clinic 10/21/17; patient we admitted to the clinic last week. 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 We put Iodosorb on him after an aggressive debridement and 3 layer compression. He complained of pain in his ankle and itching with is skin in fact he scratched the area on the medial calf superiorly at the rim of our wraps and he has 2 small open areas in  that location today which are new. I changed his primary dressing today to silver collagen. As noted he is already had revascularization and does not have any significant superficial venous insufficiency that would be amenable to ablation 10/28/17; patient admitted to the clinic 2 weeks ago. He has a smaller Wound. Scratch injury from last week revealed. There is large wound over the tibial area. This is smaller. Granulation looks healthy. No need for debridement. 11/04/17; the wound on the left lateral calf looks better. Improved dimensions. Surface of this looks better. We've been maintaining him and Kerlix Coban wraps. He finds this much more comfortable. Silver collagen dressing 11/11/17; left lateral Wound continues to look healthy be making progress. Using a #5 curet I removed removed nonviable skin from the surface of the wound and then necrotic debris from the wound surface. Surface of the wound continues to look healthy. ooHe also has an open area on the left great toenail bed. We've been using topical antibiotics. 11/19/17; left anterior lateral wound continues to look healthy but it's not closed. ooHe also had a small wound above this on the left leg ooInitially traumatic wounds in the setting of significant chronic venous insufficiency and stasis dermatitis 11/25/17; left anterior wounds superiorly is closed still a small wound inferiorly. 12/02/17; left anterior tibial area. Arrives today with adherent callus. Post debridement clearly not completely closed. Hydrofera Blue under 3 layer compression. 12/09/17; left anterior tibia. Circumferential eschar however the wound bed looks stable to improved. We've been using Hydrofera Blue under 3 layer compression 12/17/17; left anterior tibia. Apparently this was felt to be closed however when the wrap was taken off there is a skin tear to reopen wounds in the same area we've been using Hydrofera Blue under 3 layer compression 12/23/17 left anterior  tibia. Not close to close this week apparently the [facility] was stuck to this again.  Still circumferential eschar requiring debridement. I put a contact layer on this this time under the Hydrofera Blue 12/31/17; left anterior tibia. Wound is better slight amount of hyper-granulation. Using Hydrofera Blue over Adaptic. 01/07/18; left anterior tibia. The wound had some surface eschar however after this was removed he has no open wound.he was already revascularized by Dr. [name] when he came to our clinic with atherectomy of the left SFA and popliteal artery. He was also sent to interventional radiology for venous reflux studies. He was not felt to have significant reflux but certainly has chronic venous changes of his skin with hemosiderin deposition around this area. He will definitely need to lubricate his skin and wear compression stocking and I've talked to him about this. READMISSION 05/26/2018 This is a now 71 year old man we cared for with traumatic wounds on his left anterior lower extremity. He had been previously revascularized during that admission by Dr. [name]. Apparently in follow-up Dr. [name] noted that he had deterioration in his arterial status. He underwent a stent placement in the distal left SFA on 04/22/2018. 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 He went back to the angiography suite on 04/30/2018 he underwent PTA and balloon angioplasty of the occluded left mid anterior tibial artery, thrombotic occlusion went from 100 to 0% which reconstitutes the posterior tibial artery. He had thrombectomy and aspiration of the peroneal artery. The stent placed in the distal SFA left SFA was still occluded. 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 Noninvasive studies on 05/02/2018 showed an TBI on the left at 0.43 and  0.82 on the right. He has been recuperating at [facility] after the most recent hospitalization. He is going home tomorrow. He tells me that 2 weeks ago he traumatized the tip of his left fifth toe. He came in urgently for our review of this. This was a history of before I noted that Dr. [name] had already noted dry gangrenous changes of the left fifth toe 06/09/2018; 2-week follow-up. I did contact Dr. [name] after his last appointment and he apparently saw 1 of Dr. Irven Shelling colleagues the next day. He does not follow-up with Dr. [name] himself until Thursday of this week. 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 Nevertheless there is no evidence of infection no drainage and no pain. He had a new area that this week when we were signing him in today on the left anterior mid tibia area, this is in close proximity to the previous wound we have dealt with in this clinic. 06/23/2018; 2-week follow-up. I did not receive a recent note from Dr. [name] to review today. Our office is trying to obtain this. He is apparently not planning to do further vascular interventions and wondered about compression to try and help with the patient's chronic venous insufficiency. However we are also concerned about the arterial flow. ooHe arrives in clinic today with a new area on the left third toe. The areas on the calf/anterior tibia are close to closing. The left fifth toe is still mummified using Betadine. -In reviewing things with the patient he has what sounds like claudication with mild to moderate amount of activity. 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 I prescribed Levaquin over the phone while we attempted  to arrange a plan of care. However the patient called yesterday to report he had low-grade fever and he came in today acutely. There is been a marked deterioration in the left third toe with spreading cellulitis up into the  dorsal left foot. He was referred to the emergency room. Readmission: 06/29/2020 patient presents today for reevaluation here in our clinic he was previously treated by Dr. Dellia Nims at the latter part of 2019 in 2 the beginning of 2020. Subsequently we have not seen him since that time in the interim he did have evaluation with vein and vascular specialist specifically Dr. Anice Paganini who did perform quite extensive work for a left femoral to anterior tibial artery bypass. With that being said in the interim the patient has developed significant lymphedema and has wounds that he tells me have really never healed in regard to the incision site on the left leg. He also has multiple wounds on the feet for various reasons some of which is that he tends to pick at his feet. Fortunately there is no signs of active infection systemically at this time he does have some wounds that are little bit deeper but most are fairly superficial he seems to have good blood flow and overall everything appears to be healthy I see no bone exposed and no obvious signs of osteomyelitis. I do not know that he necessarily needs a x-ray at this point although that something we could consider depending on how things progress. The patient does have a history of lymphedema, diabetes, this is type II, chronic kidney disease stage III, hypertension, and history of peripheral vascular disease. 07/05/2020; patient admitted last week. Is a patient I remember from 2019 he had a spreading infection involving the left foot and we sent him to the hospital. 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 He subsequently had a left femoral to anterior tibial bypass by Dr.Cain vein and vascular. He also has severe lymphedema with chronic skin changes related to that on the left leg. The most problematic area that was new today was on the left medial great toe. This was apparently a small area last week there was  purulent drainage which our intake nurse cultured. Also areas on the left medial foot and heel left lateral foot. He has 2 areas on the left medial calf left lateral calf in the setting of the severe lymphedema. 07/13/2020 on evaluation today patient appears to be doing better in my opinion compared to his last visit. The good news is there is no signs of active infection systemically and locally I do not see any signs of infection either. He did have an x-ray which was negative that is great news he had a culture which showed MRSA but at the same time he is been on the doxycycline which has helped. I do think we may want to extend this for 7 additional days 1/25; patient admitted to the clinic a few weeks ago. 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 We have been putting him under compression his edema control is a lot better but he is severe verricused skin on the left leg. He is really done quite well he still has an open area on the left medial calf and the left medial first metatarsal head. We have been using silver collagen on the leg silver alginate on the foot 07/27/2020 upon evaluation today patient appears to be doing  decently well in regard to his wounds. He still has a lot of dry skin on the left leg. Some of this is starting to peel back and I think he may be able to have them out by removing some that today. Fortunately there is no signs of active infection at this time on the left leg although on the right leg he does appear to have swelling and erythema as well as some mild warmth to touch. This does have been concerned about the possibility of cellulitis although within the differential diagnosis I do think that potentially a DVT has to be at least considered. We need to rule that out before proceeding would just call in the cellulitis. Especially since he is having pain in the posterior aspect of his calf muscle. 2/8; the patient had seen  sparingly. He has severe skin changes of chronic lymphedema in the left leg thickened hyperkeratotic verrucous skin. He has an open wound on the medial part of the left first met head left mid tibia. He also has a rim of nonepithelialized skin in the anterior mid tibia. He brought in the AmLactin lotion that was been prescribed although I am not sure under compression and its utility. There concern about cellulitis on the right lower leg the last time he was here. He was put on on antibiotics. His DVT rule out was negative. The right leg looks fine he is using his stocking on this area 08/10/2020 upon evaluation today patient appears to be doing well with regard to his leg currently. He has been tolerating the dressing changes without complication. Fortunately there is no signs of active infection which is great news. Overall very pleased with where things stand. 2/22; the patient still has an area on the medial part of the left first met his head. This looks better than when I last saw this earlier this month he has a rim of epithelialization but still some surface debris. Mostly everything on the left leg is healed. There is still a vulnerable in the left mid tibia area. 08/30/2020 upon evaluation today patient appears to be doing much better in regard to his wounds on his foot. 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 Nonetheless I think we will address that today I Minna send in a prescription for him in that regard. Overall though there does not appear to be any signs of significant worsening. 09/07/2020 on evaluation today patient's wounds over his left foot appear to be doing excellent. I do not see any signs of infection there is some callus buildup this can require debridement for certain but overall I feel like he is managing quite nicely. He still using the  AmLactin cream which has been beneficial for him as well. 3/22; left foot wound is closed. There is no open area here. He is using ammonium lactate lotion to the lower extremities to help exfoliate dry cracked skin. He has compression stockings from elastic therapy in [place]. The wound on the medial part of his left first met head is healed today. READMISSION 04/12/2021 Mr. Rengel is a patient we know fairly well he had a prolonged stay in clinic in 2019 with wounds on his left lateral and left anterior lower extremity in the setting of chronic venous insufficiency. More recently he was here earlier this year with predominantly an area on his left foot  first metatarsal head plantar and he says the plantar foot broke down on its not long after we discharged him but he did not come back here. The last few months areas of broken down on his left anterior and again the left lateral lower extremity. The leg itself is very swollen chronically enlarged a lot of hyperkeratotic dry Berry Q skin in the left lower leg. His edema extends well into the thigh. He was seen by Dr. Donzetta Matters. He had ABIs on 03/02/2021 showing an ABI on the right of 1 with a TBI of 0.72 his ABI in the left at 1.09 TBI of 0.99. Monophasic and biphasic waveforms on the right. On the left monophasic waveforms were noted he went on to have an angiogram on 03/27/2021 this showed the aortic aortic and iliac segments were free of flow-limiting stenosis the left common femoral vein to evaluate the left femoral to anterior tibial artery bypass was unobstructed the bypass was patent without any areas of stenosis. We discharged the patient in bilateral juxta lite stockings but very clearly that was not sufficient to control the swelling and maintain skin integrity. He is clearly going to need compression pumps. 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 This is fortunate because he is on his feet  for long periods of time. 10/27; patient comes in with our intake nurse reporting copious amount of green drainage from the left anterior mid tibia the left dorsal foot and to a lesser extent the left medial mid tibia. We left the compression wrap on all week for the amount of edema in his left leg is quite a bit better. We use silver alginate as the primary dressing 11/3; edema control is good. Left anterior lower leg left medial lower leg and the plantar first metatarsal head. The left anterior lower leg required debridement. Deep tissue culture I did of this wound showed MRSA I put him on 10 days of doxycycline which she will start today. We have him in compression wraps. He has a security card and [employer] however he is retiring on November 15. We will need to then get him into a better offloading boot for the left foot perhaps a total contact cast 11/10; edema control is quite good. Left anterior and left medial lower leg wounds in the setting of chronic venous insufficiency and lymphedema. He also has a substantial area over the left plantar first metatarsal head. 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 He has significant hypergranulation on the left plantar foot wound. The patient is a diabetic but he does not have significant PAD 11/17; edema control is quite good. Left anterior and left medial lower leg wounds look better. The really concerning area remains the area on the left plantar first metatarsal head. He has a rim of epithelialization. 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 Starting today with a forefoot off loader but . possibly a total contact cast. 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 Arrives with a very significant odor from I think all of the wounds on  the left leg and the left foot. Because of this I did not put a total contact cast on him today but will could still consider this. His wife was having cataract surgery which is the reason he missed the appointment 12/6. I saw this man 5 days ago with a swelling below the popliteal fossa. I thought he actually might have a Baker's cyst however the DVT rule out study that we could arrange right away was negative the technician told me this was not a ruptured Baker's cyst. We attempted to get this aspirated by under ultrasound guidance in interventional radiology however all they did was an ultrasound however it shows an extensive fluid collection 62 x 8 x 9.4 in the left thigh and left calf. The patient states he thinks this started 8 days ago or so but he really is not complaining of any pain, fever or systemic symptoms. He has not ha 12/20; after some difficulty I managed to get the patient into see Dr. Donzetta Matters. Eventually he was taken into the hospital and had a drain put in the fluid collection below his left knee posteriorly extending into the posterior thigh. He still has the drain in place. Culture of this showed moderate staff aureus few Morganella and few Klebsiella he is now on doxycycline and ciprofloxacin as suggested by infectious disease he is on this for a month. The drain will remain in place until it stops draining 12/29; he comes in today with the 1 wound on his left leg and the area on the left plantar first met head significantly smaller. Both look healthy. He still has the drain in the left leg. He says he has to change this daily. Follows up with Dr. Donzetta Matters on January 11. 06/29/2021; the wounds that I am following on the left leg and left first met head continued to be quite healthy. However the area where his inferior drain is in place had copious amounts of drainage which was green in color.  The wound here is larger. Follows up with Dr. Gwenlyn Saran of vein and vascular his surgeon next week as well as infectious disease. He remains on ciprofloxacin and doxycycline. He is not complaining of excessive pain in either one of the drain areas 1/12; the patient saw vascular surgery and infectious disease. Vascular surgery has left the drain in place as there was still some notable drainage still see him back in 2 weeks. Dr. Velna Ochs stop the doxycycline and ciprofloxacin and I do not believe he follows up with them at this point. Culture I did last week showed both doxycycline resistant MRSA and Pseudomonas not sensitive to ciprofloxacin although only in rare titers 1/19; the patient's wound on the left anterior lower leg is just about healed. We have continued healing of the area that was medially on the left leg. Left first plantar metatarsal head continues to get smaller. The major problem here is his 2 drain sites 1 on the left upper calf and lateral thigh. There is purulent drainage still from the left lateral thigh. I gave him antibiotics last week but we still have recultured. He has the drain in the area I think this is eventually going to have to come out. I suspect there will be a connecting wound to heal here perhaps with improved VAc 1/26; the patient had his drain removed by vein and vascular on 1/25/. This was a large pocket of fluid in his left thigh that seem to tunnel into his left upper calf. He had  a previous left SFA to anterior tibial artery bypass. His mention his Penrose drain was removed today. He now has a tunneling wound on his left calf and left thigh. Both of these probe widely towards each other although I cannot really prove that they connect. Both wounds on his lower leg anteriorly are closed and his area over the first metatarsal head on his right foot continues to improve. We are using Hydrofera Blue here. He also saw infectious disease culture of the abscess they noted  was polymicrobial with MRSA, Morganella and Klebsiella he was treated with doxycycline and ciprofloxacin for 4 weeks ending on 07/03/2021. They did not recommend any further antibiotics. Notable that while he still had the Penrose drain in place last week he had purulent drainage coming out of the inferior IandD site this grew [place] [facility], MRSA and Pseudomonas but there does not appear to be any active infection in this area today with the drain out and he is not systemically unwell 2/2; with regards to the drain sites the superior one on the thigh actually is closed down the one on the upper left lateral calf measures about 8 and half centimeters which is an improvement seems to be less prominent although still with a lot of drainage. The only remaining wound is over the first metatarsal head on the left foot and this looks to be continuing to improve with Hydrofera Blue. 2/9; the area on his plantar left foot continues to contract. Callus around the wound edge. The drain sites specifically have not come down in depth. We put the wound VAC on Monday he changed the canister late last night our intake nurse reported a pocket of fluid perhaps caused by our compression wraps 2/16; continued improvement in left foot plantar wound. drainage site in the calf is not improved in terms of depth (wound vac) 2/23; continued improvement in the left foot wound over the first metatarsal head. With regards to the drain sites the area on his thigh laterally is healed however the open area on his calf is small in terms of circumference by still probes in by about 15 cm. Within using the wound VAC. Hydrofera Blue on his foot 08/24/2021: The left first metatarsal head wound continues to improve. The wound bed is healthy with just some surrounding callus. Unfortunately the open drain site on his calf remains open and tunnels at least 15 cm (the extent of a Q-tip). This is despite several weeks of wound VAC treatment.  Based on reading back through the notes, there has been really no significant change in the depth of the wound, although the orifice is smaller and the more cranial wound on his thigh has closed. I suspect the tunnel tracks nearly all the way to this location. 08/31/2021: Continued improvement in the left first metatarsal head wound. There has been absolutely no improvement to the long tunnel from his open drain site on his calf. We have tried to get him into see vascular surgery sooner to consider the possibility of simply filleting the tract open and allowing it to heal from the bottom up, likely with a wound VAC. They have not yet scheduled a sooner appointment than his current mid April 09/14/2021: He was seen by vascular surgery and they took him to the operating room last week. They opened a portion of the tunnel, but did not extend the entire length of the known open subcutaneous tract. I read Dr. Claretha Cooper operative note and it is not clear from that  documentation why only a portion of the tract was opened. The heaped up granulation tissue was curetted and removed from at least some portion of the tract. They did place a wound VAC and applied an Unna boot to the leg. The ulcer on his left first metatarsal head is smaller today. The bed looks good and there is just a small amount of surrounding callus. 09/21/2021: The ulcer on his left first metatarsal head looks to be stalled. There is some callus surrounding the wound but the wound bed itself does not appear particularly dynamic. The tunnel tract on his lateral left leg seems to be roughly the same length or perhaps slightly smaller but the wound bed appears healthy with good granulation tissue. He opened up a new wound on his medial thigh and the site of a prior surgical incision. He says that he did this unconsciously in his sleep by scratching. 09/28/2021: Unfortunately, the ulcer on his left first metatarsal head has extended underneath the callus  toward the dorsum of his foot. The medial thigh wounds are roughly the same. The tunnel on his lateral left leg continues to be problematic; it is longer than we are able to actually probe with a Q-tip. I am still not certain as to why Dr. Donzetta Matters did not open this up entirely when he took the patient to the operating room. We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 Birth/Sex: Treating RN: Mar 02, 1951 (71 y.o. 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 Facility Procedures CPT4 Code: 77824235 Description: [id number] - DEBRIDE WOUND 1ST 20 SQ CM OR < ICD-10 Diagnosis Description 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 Modifier: verity Quantity: 1 CPT4 Code: [id number] Description: 76195 - APPLY TOTAL CONTACT LEG CAST ICD-10 Diagnosis Description 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 of other part of left foot with other specified severit Modifier: y Quantity: 1 Physician Procedures : CPT4 Code Description Modifier [id number] - WC PHYS LEVEL 3 - EST PT 25 ICD-10 Diagnosis Description 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verity 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 of other part of  left foot with other specified  severity I89.0 Lymphedema, not elsewhere classified 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

## 2022-03-09 NOTE — Progress Notes (Signed)
[name] ([id number]) Visit Report for 03/08/2022 Arrival Information Details Patient Name: Date of Service: [name], [name] 03/08/2022 8:15 A M Medical Record Number: [id number] Patient Account Number: [id number] Date of Birth/Sex: Treating RN: [birth year] (70 y.o. [name] Primary Care Kinze Labo: [name] Other Clinician: Referring Sadrac Zeoli: Treating Tudor Chandley/Extender: [name] in Treatment: 100 Visit Information History Since Last Visit All ordered tests and consults were completed: Yes Patient Arrived: Ambulatory Added or deleted any medications: No Arrival Time: 08:14 Any new allergies or adverse reactions: No Accompanied By: self Had a fall or experienced change in No Transfer Assistance: None activities of daily living that may affect Patient Requires Transmission-Based Precautions: No risk of falls: Patient Has Alerts: Yes Signs or symptoms of abuse/neglect since last visito No Hospitalized since last visit: No Implantable device outside of the clinic excluding No cellular tissue based products placed in the center since last visit: Has Dressing in Place as Prescribed: Yes Has Compression in Place as Prescribed: Yes Pain Present Now: Yes Electronic Signature(s) Signed: 03/09/2022 5:03:05 PM By: Blanche East RN Entered By: Blanche East on 03/08/2022 08:15:16 -------------------------------------------------------------------------------- Encounter Discharge Information Details Patient Name: Date of Service: [name]. 03/08/2022 8:15 A M Medical Record Number: [id number] Patient Account Number: [id number] Date of Birth/Sex: Treating RN: 07/24/1950 (71 y.o. [name] Primary Care Monasia Lair: [name] Other Clinician: Referring Tashayla Therien: Treating Jaxzen Vanhorn/Extender: [name] in Treatment: 70 Encounter Discharge Information Items Post Procedure Vitals Discharge Condition:  Stable Temperature (F): 97.8 Ambulatory Status: Ambulatory Pulse (bpm): 61 Discharge Destination: Home Respiratory Rate (breaths/min): 20 Transportation: Private Auto Blood Pressure (mmHg): 159/78 Accompanied By: self Schedule Follow-up Appointment: Yes Clinical Summary of Care: Electronic Signature(s) Signed: 03/09/2022 5:03:05 PM By: Blanche East RN Entered By: Blanche East on 03/08/2022 09:43:51 -------------------------------------------------------------------------------- Lower Extremity Assessment Details Patient Name: Date of Service: [name] 03/08/2022 8:15 A M Medical Record Number: [id number] Patient Account Number: [id number] Date of Birth/Sex: Treating RN: 11-Aug-1950 (71 y.o. [name] Primary Care Asanti Craigo: [name] Other Clinician: Referring Hjalmer Iovino: Treating Lorelai Huyser/Extender: [name] in Treatment: 47 Edema Assessment Assessed: [name]: No] [Right: No] Edema: [Left: Ye] [Right: s] Calf Left: Right: Point of Measurement: 41 cm From Medial Instep 46.5 cm Ankle Left: Right: Point of Measurement: 10 cm From Medial Instep 28.5 cm Vascular Assessment Pulses: Dorsalis Pedis Palpable: [Left:Yes] Electronic Signature(s) Signed: 03/09/2022 5:03:05 PM By: Blanche East RN Entered By: Blanche East on 03/08/2022 08:19:40 -------------------------------------------------------------------------------- Multi Wound Chart Details Patient Name: Date of Service: [name]. 03/08/2022 8:15 A M Medical Record Number: [id number] Patient Account Number: [id number] Date of Birth/Sex: Treating RN: Oct 03, 1950 (71 y.o. [name] Primary Care Demont Linford: [name] Other Clinician: Referring Jahmiyah Dullea: Treating Cortasia Screws/Extender: [name] in Treatment: 14 Vital Signs Height(in): 74 Capillary Blood Glucose(mg/dl): 226 Weight(lbs): 238 Pulse(bpm): 32 Body Mass Index(BMI): 30.6 Blood  Pressure(mmHg): 159/78 Temperature(F): 97.8 Respiratory Rate(breaths/min): 20 Photos: [22:Left, Lateral Lower Leg] [27:Left, Medial Foot] [N/A:N/A N/A] Wound Location: [22:Bump] [27:Blister] [N/A:N/A] Wounding Event: [22:Cyst] [27:Diabetic Wound/Ulcer of the Lower] [N/A:N/A] Primary Etiology: [22:Glaucoma, Sleep Apnea,] [27:Extremity Glaucoma, Sleep Apnea,] [N/A:N/A] Comorbid History: [22:Hypertension, Peripheral Arterial Disease, Peripheral Venous Disease, Disease, Peripheral Venous Disease, Type II Diabetes, Gout, Osteoarthritis, Type II Diabetes, Gout, Osteoarthritis, Neuropathy 06/03/2021] [27:Hypertension,  Peripheral Arterial Neuropathy 02/01/2022] [N/A:N/A] Date Acquired: [22:39] [27:5] [N/A:N/A] Weeks of Treatment: [22:Open] [27:Open] [N/A:N/A] Wound Status: [22:No] [27:No] [N/A:N/A] Wound Recurrence: [22:Yes] [27:No] [N/A:N/A]  Clustered Wound: [22:3] [27:N/A] [N/A:N/A] Clustered Quantity: [22:9.5x1.4x0.1] [27:3.7x3.4x0.3] [N/A:N/A] Measurements L x W x D (cm) [22:10.446] [27:9.88] [N/A:N/A] A (cm) : rea [22:1.045] [27:2.964] [N/A:N/A] Volume (cm) : [22:-533.50%] [27:-867.70%] [N/A:N/A] % Reduction in A [22:rea: 20.80%] [27:-2805.90%] [N/A:N/A] % Reduction in Volume: [27:3] Starting Position 1 (o'clock): [27:6] Ending Position 1 (o'clock): [27:0.6] Maximum Distance 1 (cm): [22:No] [27:Yes] [N/A:N/A] Undermining: [22:Full Thickness With Exposed Support Grade 1] [N/A:N/A] Classification: [22:Structures Medium] [27:Medium] [N/A:N/A] Exudate A mount: [22:Serosanguineous] [27:Serosanguineous] [N/A:N/A] Exudate Type: [22:red, brown] [27:red, brown] [N/A:N/A] Exudate Color: [22:Fibrotic scar, thickened scar] [27:Distinct, outline attached] [N/A:N/A] Wound Margin: [22:Large (67-100%)] [27:Small (1-33%)] [N/A:N/A] Granulation A mount: [22:Red, Pink] [27:Red, Pale] [N/A:N/A] Granulation Quality: [22:Small (1-33%)] [27:Large (67-100%)] [N/A:N/A] Necrotic A mount: [22:Adherent Slough]  [27:Eschar, Adherent Slough] [N/A:N/A] Necrotic Tissue: [22:Fat Layer (Subcutaneous Tissue): Yes Fat Layer (Subcutaneous Tissue): Yes N/A] Exposed Structures: [22:Fascia: No Tendon: No Muscle: No Joint: No Bone: No Medium (34-66%)] [27:Fascia: No Tendon: No Muscle: No Joint: No Bone: No Small (1-33%)] [N/A:N/A] Epithelialization: [22:Debridement - Selective/Open Wound Debridement - Excisional] [N/A:N/A] Debridement: Pre-procedure Verification/Time Out 08:50 [27:08:50] [N/A:N/A] Taken: [22:Lidocaine 4% Topical Solution] [27:Lidocaine 4% Topical Solution] [N/A:N/A] Pain Control: [22:Slough] [27:Subcutaneous, Slough] [N/A:N/A] Tissue Debrided: [22:Non-Viable Tissue] [27:Skin/Subcutaneous Tissue] [N/A:N/A] Level: [22:7] [27:12.58] [N/A:N/A] Debridement A (sq cm): [22:rea Curette] [27:Curette] [N/A:N/A] Instrument: [22:Minimum] [27:Minimum] [N/A:N/A] Bleeding: [22:Pressure] [27:Pressure] [N/A:N/A] Hemostasis A chieved: [22:0] [27:0] [N/A:N/A] Procedural Pain: [22:0] [27:0] [N/A:N/A] Post Procedural Pain: [22:Procedure was tolerated well] [27:Procedure was tolerated well] [N/A:N/A] Debridement Treatment Response: [22:9.5x1.4x0.1] [27:3.7x3.4x0.3] [N/A:N/A] Post Debridement Measurements L x W x D (cm) [22:1.045] [27:2.964] [N/A:N/A] Post Debridement Volume: (cm) [22:Debridement] [27:Debridement] [N/A:N/A] Procedures Performed: [27:T Contact Cast otal] Treatment Notes Electronic Signature(s) Signed: 03/08/2022 9:10:37 AM By: Fredirick Maudlin MD FACS Signed: 03/08/2022 5:22:18 PM By: Baruch Gouty RN, BSN Entered By: Fredirick Maudlin on 03/08/2022 09:10:36 -------------------------------------------------------------------------------- Multi-Disciplinary Care Plan Details Patient Name: Date of Service: [name]. 03/08/2022 8:15 A M Medical Record Number: [id number] Patient Account Number: [id number] Date of Birth/Sex: Treating RN: 10/15/1950 (71 y.o. [name] Primary Care  Ellorie Kindall: [name] Other Clinician: Referring Taira Knabe: Treating Nayeli Calvert/Extender: [name] in Treatment: 67 Multidisciplinary Care Plan reviewed with physician Active Inactive Venous Leg Ulcer Nursing Diagnoses: Knowledge deficit related to disease process and management Potential for venous Insuffiency (use before diagnosis confirmed) Goals: Patient will maintain optimal edema control Date Initiated: 07/27/2021 Target Resolution Date: 03/15/2022 Goal Status: Active Interventions: Assess peripheral edema status every visit. Treatment Activities: Therapeutic compression applied : 07/27/2021 Notes: Wound/Skin Impairment Nursing Diagnoses: Impaired tissue integrity Knowledge deficit related to ulceration/compromised skin integrity Goals: Patient will have a decrease in wound volume by X% from date: (specify in notes) Date Initiated: 04/12/2021 Date Inactivated: 01/04/2022 Target Resolution Date: 04/23/2021 Goal Status: Met Patient/caregiver will verbalize understanding of skin care regimen Date Initiated: 01/04/2022 Target Resolution Date: 03/30/2022 Goal Status: Active Ulcer/skin breakdown will have a volume reduction of 30% by week 4 Date Initiated: 04/12/2021 Date Inactivated: 04/27/2021 Target Resolution Date: 04/27/2021 Goal Status: Unmet Unmet Reason: infection Ulcer/skin breakdown will have a volume reduction of 50% by week 8 Date Initiated: 04/27/2021 Date Inactivated: 06/29/2021 Target Resolution Date: 06/24/2021 Goal Status: Met Interventions: Assess patient/caregiver ability to obtain necessary supplies Assess patient/caregiver ability to perform ulcer/skin care regimen upon admission and as needed Assess ulceration(s) every visit Notes: Electronic Signature(s) Signed: 03/09/2022 5:03:05 PM By: Blanche East RN Entered By: Blanche East on 03/08/2022  08:26:51 -------------------------------------------------------------------------------- Pain Assessment Details Patient Name: Date of Service: [name],  [place] [facility]. 03/08/2022 8:15 A M Medical Record Number: [id number] Patient Account Number: [id number] Date of Birth/Sex: Treating RN: 1950-08-31 (71 y.o. [name] Primary Care Layal Javid: [name] Other Clinician: Referring Murlene Revell: Treating Orly Quimby/Extender: [name] in Treatment: 49 Active Problems Location of Pain Severity and Description of Pain Patient Has Paino Yes Site Locations Rate the pain. Current Pain Level: 6 Pain Management and Medication Current Pain Management: Notes Pain to the lateral left leg wound Electronic Signature(s) Signed: 03/09/2022 5:03:05 PM By: Blanche East RN Entered By: Blanche East on 03/08/2022 08:16:06 -------------------------------------------------------------------------------- Patient/Caregiver Education Details Patient Name: Date of Service: [name] 9/14/2023andnbsp8:15 A M Medical Record Number: [id number] Patient Account Number: [id number] Date of Birth/Gender: Treating RN: 1950/12/12 (71 y.o. [name] Primary Care Physician: [name] Other Clinician: Referring Physician: Treating Physician/Extender: [name] in Treatment: 58 Education Assessment Education Provided To: Patient Education Topics Provided Wound/Skin Impairment: Methods: Explain/Verbal Responses: Reinforcements needed, State content correctly Electronic Signature(s) Signed: 03/09/2022 5:03:05 PM By: Blanche East RN Entered By: Blanche East on 03/08/2022 08:27:07 -------------------------------------------------------------------------------- Wound Assessment Details Patient Name: Date of Service: [name]. 03/08/2022 8:15 A M Medical Record Number: [id number] Patient Account Number: [id number] Date of Birth/Sex: Treating  RN: 1951/05/22 (71 y.o. [name] Primary Care Esbeidy Mclaine: [name] Other Clinician: Referring Jovie Swanner: Treating Ernestina Joe/Extender: [name] in Treatment: 47 Wound Status Wound Number: 22 Primary Cyst Etiology: Wound Location: Left, Lateral Lower Leg Wound Open Wounding Event: Bump Status: Date Acquired: 06/03/2021 Comorbid Glaucoma, Sleep Apnea, Hypertension, Peripheral Arterial Disease, Weeks Of Treatment: 39 History: Peripheral Venous Disease, Type II Diabetes, Gout, Osteoarthritis, Clustered Wound: Yes Neuropathy Photos Wound Measurements Length: (cm) 9.5 Width: (cm) 1.4 Depth: (cm) 0.1 Clustered Quantity: 3 Area: (cm) 10.446 Volume: (cm) 1.045 % Reduction in Area: -533.5% % Reduction in Volume: 20.8% Epithelialization: Medium (34-66%) Tunneling: No Undermining: No Wound Description Classification: Full Thickness With Exposed Support Structures Wound Margin: Fibrotic scar, thickened scar Exudate Amount: Medium Exudate Type: Serosanguineous Exudate Color: red, brown Foul Odor After Cleansing: No Slough/Fibrino Yes Wound Bed Granulation Amount: Large (67-100%) Exposed Structure Granulation Quality: Red, Pink Fascia Exposed: No Necrotic Amount: Small (1-33%) Fat Layer (Subcutaneous Tissue) Exposed: Yes Necrotic Quality: Adherent Slough Tendon Exposed: No Muscle Exposed: No Joint Exposed: No Bone Exposed: No Treatment Notes Wound #22 (Lower Leg) Wound Laterality: Left, Lateral Cleanser Soap and Water Discharge Instruction: May shower and wash wound with dial antibacterial soap and water prior to dressing change. Wound Cleanser Discharge Instruction: Cleanse the wound with wound cleanser prior to applying a clean dressing using gauze sponges, not tissue or cotton balls. Peri-Wound Care Sween Lotion (Moisturizing lotion) Discharge Instruction: Apply moisturizing lotion to the leg Topical Primary Dressing KerraCel Ag  Gelling Fiber Dressing, 4x5 in (silver alginate) Discharge Instruction: Apply silver alginate to wound bed as instructed Secondary Dressing ABD Pad, 8x10 Discharge Instruction: Apply over primary dressing as directed. Secured With Elastic Bandage 4 inch (ACE bandage) Discharge Instruction: Secure with ACE bandage as directed. Compression Wrap Compression Stockings Add-Ons Electronic Signature(s) Signed: 03/09/2022 5:03:05 PM By: Blanche East RN Entered By: Blanche East on 03/08/2022 08:25:35 -------------------------------------------------------------------------------- Wound Assessment Details Patient Name: Date of Service: [name]. 03/08/2022 8:15 A M Medical Record Number: [id number] Patient Account Number: [id number] Date of Birth/Sex: Treating RN: Dec 25, 1950 (71 y.o. [name] Primary Care Audley Hinojos: [name] Other Clinician: Referring Markeith Jue: Treating Idalie Canto/Extender: [name]  in Treatment: 47 Wound Status Wound Number: 27 Primary Diabetic Wound/Ulcer of the Lower Extremity Etiology: Wound Location: Left, Medial Foot Wound Open Wounding Event: Blister Status: Date Acquired: 02/01/2022 Comorbid Glaucoma, Sleep Apnea, Hypertension, Peripheral Arterial Disease, Weeks Of Treatment: 5 History: Peripheral Venous Disease, Type II Diabetes, Gout, Osteoarthritis, Clustered Wound: No Neuropathy Photos Wound Measurements Length: (cm) 3.7 Width: (cm) 3.4 Depth: (cm) 0.3 Area: (cm) 9.88 Volume: (cm) 2.964 Wound Description Classification: Grade 1 Wound Margin: Distinct, outline attached Exudate Amount: Medium Exudate Type: Serosanguineous Exudate Color: red, brown Foul Odor After Cleansing: No Slough/Fibrino Yes % Reduction in Area: -867.7% % Reduction in Volume: -2805.9% Epithelialization: Small (1-33%) Tunneling: No Undermining: Yes Starting Position (o'clock): 3 Ending Position (o'clock): 6 Maximum Distance:  (cm) 0.6 Wound Bed Granulation Amount: Small (1-33%) Exposed Structure Granulation Quality: Red, Pale Fascia Exposed: No Necrotic Amount: Large (67-100%) Fat Layer (Subcutaneous Tissue) Exposed: Yes Necrotic Quality: Eschar, Adherent Slough Tendon Exposed: No Muscle Exposed: No Joint Exposed: No Bone Exposed: No Treatment Notes Wound #27 (Foot) Wound Laterality: Left, Medial Cleanser Peri-Wound Care Sween Lotion (Moisturizing lotion) Discharge Instruction: Apply moisturizing lotion as directed Topical Ketoconazole Cream 2% Discharge Instruction: Apply Ketoconazole to top of foot Primary Dressing Hydrofera Blue Classic Foam, 4x4 in Discharge Instruction: Moisten with saline prior to applying to wound bed Secondary Dressing Woven Gauze Sponges 2x2 in Discharge Instruction: Apply over primary dressing as directed. Zetuvit Plus 4x8 in Discharge Instruction: Apply over primary dressing as directed. Secured With 39M Medipore H Soft Cloth Surgical T ape, 4 x 10 (in/yd) Discharge Instruction: Secure with tape as directed. Compression Wrap Compression Stockings Add-Ons Electronic Signature(s) Signed: 03/09/2022 5:03:05 PM By: Blanche East RN Entered By: Blanche East on 03/08/2022 08:25:02 -------------------------------------------------------------------------------- Vitals Details Patient Name: Date of Service: [name]. 03/08/2022 8:15 A M Medical Record Number: [id number] Patient Account Number: [id number] Date of Birth/Sex: Treating RN: 24-Mar-1951 (71 y.o. [name] Primary Care Curry Seefeldt: [name] Other Clinician: Referring Arwilda Georgia: Treating Ayden Hardwick/Extender: [name] in Treatment: 69 Vital Signs Time Taken: 08:15 Temperature (F): 97.8 Height (in): 74 Pulse (bpm): 61 Weight (lbs): 238 Respiratory Rate (breaths/min): 20 Body Mass Index (BMI): 30.6 Blood Pressure (mmHg): 159/78 Capillary Blood Glucose (mg/dl):  226 Reference Range: 80 - 120 mg / dl Electronic Signature(s) Signed: 03/09/2022 5:03:05 PM By: Blanche East RN Entered By: Blanche East on 03/08/2022 08:15:41

## 2022-03-11 ENCOUNTER — Other Ambulatory Visit: Payer: Self-pay | Admitting: Nurse Practitioner

## 2022-03-15 ENCOUNTER — Encounter (HOSPITAL_BASED_OUTPATIENT_CLINIC_OR_DEPARTMENT_OTHER): Payer: Medicare Other | Admitting: General Surgery

## 2022-03-15 DIAGNOSIS — E11621 Type 2 diabetes mellitus with foot ulcer: Secondary | ICD-10-CM | POA: Diagnosis not present

## 2022-03-15 NOTE — Progress Notes (Signed)
[name], [name] ([id number]) Visit Report for 03/15/2022 Arrival Information Details Patient Name: Date of Service: [name] 03/15/2022 10:30 A M Medical Record Number: [id number] Patient Account Number: [id number] Date of Birth/Sex: Treating RN: [birth year] (71 y.o. [name] Primary Care Chelesea Weiand: [name] Other Clinician: Referring Jerris Keltz: Treating Nike Southwell/Extender: [name] in Treatment: 36 Visit Information History Since Last Visit Added or deleted any medications: No Patient Arrived: Ambulatory Any new allergies or adverse reactions: No Arrival Time: 10:49 Had a fall or experienced change in No Accompanied By: self activities of daily living that may affect Transfer Assistance: None risk of falls: Patient Identification Verified: Yes Signs or symptoms of abuse/neglect since last visito No Secondary Verification Process Completed: Yes Hospitalized since last visit: No Patient Requires Transmission-Based Precautions: No Implantable device outside of the clinic excluding No Patient Has Alerts: Yes cellular tissue based products placed in the center since last visit: Has Dressing in Place as Prescribed: Yes Has Footwear/Offloading in Place as Prescribed: Yes Left: T Contact Cast otal Pain Present Now: No Electronic Signature(s) Signed: 03/15/2022 5:15:24 PM By: Baruch Gouty RN, BSN Entered By: Baruch Gouty on 03/15/2022 10:50:01 -------------------------------------------------------------------------------- Encounter Discharge Information Details Patient Name: Date of Service: [name] Garfinkel. 03/15/2022 10:30 A M Medical Record Number: [id number] Patient Account Number: [id number] Date of Birth/Sex: Treating RN: 1950/10/18 (71 y.o. [name] Primary Care Annalaya Wile: [name] Other Clinician: Referring Bijon Mineer: Treating Lona Six/Extender: [name] in Treatment: 48 Encounter  Discharge Information Items Post Procedure Vitals Discharge Condition: Stable Temperature (F): 97.7 Ambulatory Status: Ambulatory Pulse (bpm): 57 Discharge Destination: Home Respiratory Rate (breaths/min): 18 Transportation: Private Auto Blood Pressure (mmHg): 141/77 Accompanied By: self Schedule Follow-up Appointment: Yes Clinical Summary of Care: Patient Declined Electronic Signature(s) Signed: 03/15/2022 5:15:24 PM By: Baruch Gouty RN, BSN Entered By: Baruch Gouty on 03/15/2022 12:09:24 -------------------------------------------------------------------------------- Lower Extremity Assessment Details Patient Name: Date of Service: [name] Garfinkel. 03/15/2022 10:30 A M Medical Record Number: [id number] Patient Account Number: [id number] Date of Birth/Sex: Treating RN: 1951-03-28 (71 y.o. [name] Primary Care Maziah Smola: [name] Other Clinician: Referring Alexandre Lightsey: Treating Vallory Oetken/Extender: [name] in Treatment: 48 Edema Assessment Assessed: [name]: No] [name]: No] Edema: [Left: Ye] [Right: s] Calf Left: Right: Point of Measurement: 41 cm From Medial Instep 46.5 cm Ankle Left: Right: Point of Measurement: 10 cm From Medial Instep 28.5 cm Vascular Assessment Pulses: Dorsalis Pedis Palpable: [Left:Yes] Electronic Signature(s) Signed: 03/15/2022 5:15:24 PM By: Baruch Gouty RN, BSN Entered By: Baruch Gouty on 03/15/2022 10:59:02 -------------------------------------------------------------------------------- Multi Wound Chart Details Patient Name: Date of Service: [name] Garfinkel. 03/15/2022 10:30 A M Medical Record Number: [id number] Patient Account Number: [id number] Date of Birth/Sex: Treating RN: [birth year] (71 y.o. [name], [name] Primary Care Kyelle Urbas: [name] Other Clinician: Referring Kadeja Granada: Treating Cherene Dobbins/Extender: [name] in Treatment: 33 Vital  Signs Height(in): 74 Capillary Blood Glucose(mg/dl): 261 Weight(lbs): 238 Pulse(bpm): 35 Body Mass Index(BMI): 30.6 Blood Pressure(mmHg): 141/77 Temperature(F): 07.7 Respiratory Rate(breaths/min): 18 Photos: [22:Left, Lateral Lower Leg] [27:Left, Medial Foot] [N/A:N/A N/A] Wound Location: [22:Bump] [27:Blister] [N/A:N/A] Wounding Event: [22:Cyst] [27:Diabetic Wound/Ulcer of the Lower] [N/A:N/A] Primary Etiology: [22:Glaucoma, Sleep Apnea,] [27:Extremity Glaucoma, Sleep Apnea,] [N/A:N/A] Comorbid History: [22:Hypertension, Peripheral Arterial Disease, Peripheral Venous Disease, Disease, Peripheral Venous Disease, Type II Diabetes, Gout, Osteoarthritis, Type II Diabetes, Gout, Osteoarthritis, Neuropathy 06/03/2021] [27:Hypertension,  Peripheral Arterial Neuropathy 02/01/2022] [N/A:N/A] Date Acquired: [22:40] [27:6] [N/A:N/A] Weeks of Treatment: [22:Open] [27:Open] [  N/A:N/A] Wound Status: [22:No] [27:No] [N/A:N/A] Wound Recurrence: [22:Yes] [27:No] [N/A:N/A] Clustered Wound: [22:3] [27:N/A] [N/A:N/A] Clustered Quantity: [22:8.5x1.5x0.1] [27:2.4x2.9x0.2] [N/A:N/A] Measurements L x W x D (cm) [76:28.315] [27:5.466] [N/A:N/A] A (cm) : rea [22:1.001] [27:1.093] [N/A:N/A] Volume (cm) : [22:-507.30%] [27:-435.40%] [N/A:N/A] % Reduction in A [22:rea: 24.10%] [27:-971.60%] [N/A:N/A] % Reduction in Volume: [22:Full Thickness With Exposed Support Grade 1] [N/A:N/A] Classification: [22:Structures Medium] [27:Medium] [N/A:N/A] Exudate A mount: [22:Serosanguineous] [27:Serosanguineous] [N/A:N/A] Exudate Type: [22:red, brown] [27:red, brown] [N/A:N/A] Exudate Color: [22:Fibrotic scar, thickened scar] [27:Distinct, outline attached] [N/A:N/A] Wound Margin: [22:Large (67-100%)] [27:Large (67-100%)] [N/A:N/A] Granulation A mount: [22:Red, Pink] [27:Red, Friable] [N/A:N/A] Granulation Quality: [22:Small (1-33%)] [27:Small (1-33%)] [N/A:N/A] Necrotic A mount: [22:Fat Layer (Subcutaneous Tissue): Yes  Fat Layer (Subcutaneous Tissue): Yes N/A] Exposed Structures: [22:Fascia: No Tendon: No Muscle: No Joint: No Bone: No Medium (34-66%)] [27:Fascia: No Tendon: No Muscle: No Joint: No Bone: No Small (1-33%)] [N/A:N/A] Epithelialization: [22:Debridement - Selective/Open Wound Debridement - Selective/Open Wound N/A] Debridement: Pre-procedure Verification/Time Out 11:15 [27:11:15] [N/A:N/A] Taken: [22:Lidocaine 4% Topical Solution] [27:Lidocaine 4% T opical Solution] [N/A:N/A] Pain Control: [22:Necrotic/Eschar, Slough] [27:Callus, Slough] [N/A:N/A] Tissue Debrided: [22:Non-Viable Tissue] [27:Non-Viable Tissue] [N/A:N/A] Level: [22:12.75] [27:6.96] [N/A:N/A] Debridement A (sq cm): [22:rea Curette] [27:Curette] [N/A:N/A] Instrument: [22:Minimum] [27:Minimum] [N/A:N/A] Bleeding: [22:Pressure] [27:Pressure] [N/A:N/A] Hemostasis A chieved: [22:0] [27:0] [N/A:N/A] Procedural Pain: [22:0] [27:0] [N/A:N/A] Post Procedural Pain: [22:Procedure was tolerated well] [27:Procedure was tolerated well] [N/A:N/A] Debridement Treatment Response: [22:8.5x1.5x0.1] [27:2.4x2.9x0.2] [N/A:N/A] Post Debridement Measurements L x W x D (cm) [22:1.001] [27:1.093] [N/A:N/A] Post Debridement Volume: (cm) [22:Debridement] [27:Debridement] [N/A:N/A] Procedures Performed: [27:T Contact Cast otal] Treatment Notes Electronic Signature(s) Signed: 03/15/2022 11:26:06 AM By: Fredirick Maudlin MD FACS Signed: 03/15/2022 5:15:24 PM By: Baruch Gouty RN, BSN Entered By: Fredirick Maudlin on 03/15/2022 11:26:06 -------------------------------------------------------------------------------- Multi-Disciplinary Care Plan Details Patient Name: Date of Service: [name] Garfinkel. 03/15/2022 10:30 A M Medical Record Number: [id number] Patient Account Number: [id number] Date of Birth/Sex: Treating RN: May 31, 1951 (71 y.o. [name] Primary Care Denecia Brunette: [name] Other Clinician: Referring Cherrill Scrima: Treating  Fajr Fife/Extender: [name] in Treatment: 48 Multidisciplinary Care Plan reviewed with physician Active Inactive Venous Leg Ulcer Nursing Diagnoses: Knowledge deficit related to disease process and management Potential for venous Insuffiency (use before diagnosis confirmed) Goals: Patient will maintain optimal edema control Date Initiated: 07/27/2021 Target Resolution Date: 04/12/2022 Goal Status: Active Interventions: Assess peripheral edema status every visit. Treatment Activities: Therapeutic compression applied : 07/27/2021 Notes: Wound/Skin Impairment Nursing Diagnoses: Impaired tissue integrity Knowledge deficit related to ulceration/compromised skin integrity Goals: Patient will have a decrease in wound volume by X% from date: (specify in notes) Date Initiated: 04/12/2021 Date Inactivated: 01/04/2022 Target Resolution Date: 04/23/2021 Goal Status: Met Patient/caregiver will verbalize understanding of skin care regimen Date Initiated: 01/04/2022 Target Resolution Date: 03/30/2022 Goal Status: Active Ulcer/skin breakdown will have a volume reduction of 30% by week 4 Date Initiated: 04/12/2021 Date Inactivated: 04/27/2021 Target Resolution Date: 04/27/2021 Goal Status: Unmet Unmet Reason: infection Ulcer/skin breakdown will have a volume reduction of 50% by week 8 Date Initiated: 04/27/2021 Date Inactivated: 06/29/2021 Target Resolution Date: 06/24/2021 Goal Status: Met Interventions: Assess patient/caregiver ability to obtain necessary supplies Assess patient/caregiver ability to perform ulcer/skin care regimen upon admission and as needed Assess ulceration(s) every visit Notes: Electronic Signature(s) Signed: 03/15/2022 5:15:24 PM By: Baruch Gouty RN, BSN Entered By: Baruch Gouty on 03/15/2022 11:12:47 -------------------------------------------------------------------------------- Pain Assessment Details Patient Name: Date of  Service: [name] Garfinkel. 03/15/2022 10:30 A M Medical Record Number: [id number] Patient  Account Number: [id number] Date of Birth/Sex: Treating RN: [birth year] (72 y.o. [name] Primary Care Rowan Pollman: [name] Other Clinician: Referring Illona Bulman: Treating Belissa Kooy/Extender: [name] in Treatment: 48 Active Problems Location of Pain Severity and Description of Pain Patient Has Paino No Patient Has Paino No Site Locations Rate the pain. Current Pain Level: 0 Pain Management and Medication Current Pain Management: Electronic Signature(s) Signed: 03/15/2022 5:15:24 PM By: Baruch Gouty RN, BSN Entered By: Baruch Gouty on 03/15/2022 10:52:02 -------------------------------------------------------------------------------- Patient/Caregiver Education Details Patient Name: Date of Service: [name] Garfinkel 9/21/2023andnbsp10:30 [place] Record Number: [id number] Patient Account Number: [id number] Date of Birth/Gender: Treating RN: Jan 31, 1951 (71 y.o. [name] Primary Care Physician: [name] Other Clinician: Referring Physician: Treating Physician/Extender: [name] in Treatment: 37 Education Assessment Education Provided To: Patient Education Topics Provided Offloading: Methods: Explain/Verbal Responses: Reinforcements needed, State content correctly Wound/Skin Impairment: Methods: Explain/Verbal Responses: Reinforcements needed, State content correctly Electronic Signature(s) Signed: 03/15/2022 5:15:24 PM By: Baruch Gouty RN, BSN Entered By: Baruch Gouty on 03/15/2022 11:13:07 -------------------------------------------------------------------------------- Wound Assessment Details Patient Name: Date of Service: [name] Garfinkel. 03/15/2022 10:30 A M Medical Record Number: [id number] Patient Account Number: [id number] Date of Birth/Sex: Treating RN: 09/08/1950 (71 y.o. [name] Primary Care Ritaj Dullea: [name] Other Clinician: Referring Cotina Freedman: Treating Anastazia Creek/Extender: [name] in Treatment: 45 Wound Status Wound Number: 22 Primary Cyst Etiology: Wound Location: Left, Lateral Lower Leg Wound Open Wounding Event: Bump Status: Date Acquired: 06/03/2021 Comorbid Glaucoma, Sleep Apnea, Hypertension, Peripheral Arterial Disease, Weeks Of Treatment: 40 History: Peripheral Venous Disease, Type II Diabetes, Gout, Osteoarthritis, Clustered Wound: Yes Neuropathy Photos Wound Measurements Length: (cm) 8.5 Width: (cm) 1.5 Depth: (cm) 0.1 Clustered Quantity: 3 Area: (cm) 10.014 Volume: (cm) 1.001 % Reduction in Area: -507.3% % Reduction in Volume: 24.1% Epithelialization: Medium (34-66%) Tunneling: No Undermining: No Wound Description Classification: Full Thickness With Exposed Support Structures Wound Margin: Fibrotic scar, thickened scar Exudate Amount: Medium Exudate Type: Serosanguineous Exudate Color: red, brown Foul Odor After Cleansing: No Slough/Fibrino Yes Wound Bed Granulation Amount: Large (67-100%) Exposed Structure Granulation Quality: Red, Pink Fascia Exposed: No Necrotic Amount: Small (1-33%) Fat Layer (Subcutaneous Tissue) Exposed: Yes Necrotic Quality: Adherent Slough Tendon Exposed: No Muscle Exposed: No Joint Exposed: No Bone Exposed: No Treatment Notes Wound #22 (Lower Leg) Wound Laterality: Left, Lateral Cleanser Soap and Water Discharge Instruction: May shower and wash wound with dial antibacterial soap and water prior to dressing change. Wound Cleanser Discharge Instruction: Cleanse the wound with wound cleanser prior to applying a clean dressing using gauze sponges, not tissue or cotton balls. Peri-Wound Care Sween Lotion (Moisturizing lotion) Discharge Instruction: Apply moisturizing lotion to the leg Topical Primary Dressing KerraCel Ag Gelling Fiber Dressing, 4x5 in  (silver alginate) Discharge Instruction: Apply silver alginate to wound bed as instructed Secondary Dressing ABD Pad, 8x10 Discharge Instruction: Apply over primary dressing as directed. Secured With Elastic Bandage 4 inch (ACE bandage) Discharge Instruction: Secure with ACE bandage as directed. Compression Wrap Compression Stockings Add-Ons Electronic Signature(s) Signed: 03/15/2022 5:15:24 PM By: Baruch Gouty RN, BSN Entered By: Baruch Gouty on 03/15/2022 11:09:17 -------------------------------------------------------------------------------- Wound Assessment Details Patient Name: Date of Service: [name] Garfinkel. 03/15/2022 10:30 A M Medical Record Number: [id number] Patient Account Number: [id number] Date of Birth/Sex: Treating RN: 1950/11/05 (71 y.o. [name] Primary Care Stacee Earp: [name] Other Clinician: Referring Nitika Jackowski: Treating Keshauna Degraffenreid/Extender: [name] in Treatment: 48 Wound  Status Wound Number: 27 Primary Diabetic Wound/Ulcer of the Lower Extremity Etiology: Wound Location: Left, Medial Foot Wound Open Wounding Event: Blister Status: Date Acquired: 02/01/2022 Comorbid Glaucoma, Sleep Apnea, Hypertension, Peripheral Arterial Disease, Weeks Of Treatment: 6 History: Peripheral Venous Disease, Type II Diabetes, Gout, Osteoarthritis, Clustered Wound: No Neuropathy Photos Wound Measurements Length: (cm) 2.4 Width: (cm) 2.9 Depth: (cm) 0.2 Area: (cm) 5.466 Volume: (cm) 1.093 % Reduction in Area: -435.4% % Reduction in Volume: -971.6% Epithelialization: Small (1-33%) Tunneling: No Undermining: No Wound Description Classification: Grade 1 Wound Margin: Distinct, outline attached Exudate Amount: Medium Exudate Type: Serosanguineous Exudate Color: red, brown Foul Odor After Cleansing: No Slough/Fibrino Yes Wound Bed Granulation Amount: Large (67-100%) Exposed Structure Granulation Quality: Red,  Friable Fascia Exposed: No Necrotic Amount: Small (1-33%) Fat Layer (Subcutaneous Tissue) Exposed: Yes Necrotic Quality: Adherent Slough Tendon Exposed: No Muscle Exposed: No Joint Exposed: No Bone Exposed: No Treatment Notes Wound #27 (Foot) Wound Laterality: Left, Medial Cleanser Peri-Wound Care Sween Lotion (Moisturizing lotion) Discharge Instruction: Apply moisturizing lotion as directed Topical Primary Dressing Hydrofera Blue Classic Foam, 4x4 in Discharge Instruction: Moisten with saline prior to applying to wound bed Secondary Dressing Woven Gauze Sponges 2x2 in Discharge Instruction: Apply over primary dressing as directed. Zetuvit Plus 4x8 in Discharge Instruction: Apply over primary dressing as directed. Secured With 66M Medipore H Soft Cloth Surgical T ape, 4 x 10 (in/yd) Discharge Instruction: Secure with tape as directed. Compression Wrap Compression Stockings Add-Ons Electronic Signature(s) Signed: 03/15/2022 5:15:24 PM By: Baruch Gouty RN, BSN Entered By: Baruch Gouty on 03/15/2022 11:10:10 -------------------------------------------------------------------------------- Vitals Details Patient Name: Date of Service: [name] Garfinkel. 03/15/2022 10:30 A M Medical Record Number: [id number] Patient Account Number: [id number] Date of Birth/Sex: Treating RN: 1950-07-24 (71 y.o. [name] Primary Care Shanan Mcmiller: [name] Other Clinician: Referring Tyeshia Cornforth: Treating Malasha Kleppe/Extender: [name] in Treatment: 48 Vital Signs Time Taken: 10:50 Temperature (F): 97.7 Height (in): 74 Pulse (bpm): 57 Weight (lbs): 238 Respiratory Rate (breaths/min): 18 Body Mass Index (BMI): 30.6 Blood Pressure (mmHg): 141/77 Capillary Blood Glucose (mg/dl): 261 Reference Range: 80 - 120 mg / dl Notes glucose per pt's meter at present Electronic Signature(s) Signed: 03/15/2022 5:15:24 PM By: Baruch Gouty RN, BSN Signed:  03/15/2022 5:15:24 PM By: Baruch Gouty RN, BSN Entered By: Baruch Gouty on 03/15/2022 12:09:35

## 2022-03-15 NOTE — Progress Notes (Signed)
[name], [name] ([id number]) Visit Report for 03/15/2022 Chief Complaint Document Details Patient Name: Date of Service: DRAYLEN, LOBUE 03/15/2022 10:30 A M Medical Record Number: [id number] Patient Account Number: [id number] Date of Birth/Sex: Treating RN: 04/22/1951 (71 y.o. [name] Primary Care Provider: [name] Other Clinician: Referring Provider: Treating Provider/Extender: [name] in Treatment: 48 Information Obtained from: Patient Chief Complaint Left leg and foot ulcers 04/12/2021; patient is here for wounds on his left lower leg and left plantar foot over the first metatarsal head Electronic Signature(s) Signed: 03/15/2022 11:27:23 AM By: Fredirick Maudlin MD FACS Entered By: Fredirick Maudlin on 03/15/2022 11:27:23 -------------------------------------------------------------------------------- Debridement Details Patient Name: Date of Service: [name]. 03/15/2022 10:30 A M Medical Record Number: [id number] Patient Account Number: [id number] Date of Birth/Sex: Treating RN: [name], 1950 (71 y.o. [name], [name] Primary Care Provider: [name] Other Clinician: Referring Provider: Treating Provider/Extender: [name] in Treatment: 48 Debridement Performed for Assessment: Wound #22 Left,Lateral Lower Leg Performed By: Physician Fredirick Maudlin, MD Debridement Type: Debridement Level of Consciousness (Pre-procedure): Awake and Alert Pre-procedure Verification/Time Out Yes - 11:15 Taken: Start Time: 11:15 Pain Control: Lidocaine 4% T opical Solution T Area Debrided (L x W): otal 8.5 (cm) x 1.5 (cm) = 12.75 (cm) Tissue and other material debrided: Non-Viable, Eschar, Slough, Slough Level: Non-Viable Tissue Debridement Description: Selective/Open Wound Instrument: Curette Bleeding: Minimum Hemostasis Achieved: Pressure Procedural Pain: 0 Post Procedural Pain: 0 Response to Treatment:  Procedure was tolerated well Level of Consciousness (Post- Awake and Alert procedure): Post Debridement Measurements of Total Wound Length: (cm) 8.5 Width: (cm) 1.5 Depth: (cm) 0.1 Volume: (cm) 1.001 Character of Wound/Ulcer Post Debridement: Improved Post Procedure Diagnosis Same as Pre-procedure Notes scribed by Baruch Gouty, RN for Dr. Celine Ahr Electronic Signature(s) Signed: 03/15/2022 11:44:41 AM By: Fredirick Maudlin MD FACS Signed: 03/15/2022 5:15:24 PM By: Baruch Gouty RN, BSN Entered By: Baruch Gouty on 03/15/2022 11:19:14 -------------------------------------------------------------------------------- HPI Details Patient Name: Date of Service: [name]. 03/15/2022 10:30 A M Medical Record Number: [id number] Patient Account Number: [id number] Date of Birth/Sex: Treating RN: Nov 25, 1950 (71 y.o. [name] Primary Care Provider: [name] Other Clinician: Referring Provider: Treating Provider/Extender: [name] in Treatment: 74 History of Present Illness HPI Description: 10/11/17; Mr. Taniguchi is a 71 year old man who tells me that in 2015 he slipped down the latter traumatizing his left leg. He developed a wound in the same spot the area that we are currently looking at. He states this closed over for the most part although he always felt it was somewhat unstable. In 2016 he hit the same area with the door of his car had this reopened. He tells me that this is never really closed although sometimes an inflow it remains open on a constant basis. He has not been using any specific dressing to this except for topical antibiotics the nature of which were not really sure. His primary doctor did send him to see Dr. [name] of interventional cardiology. He underwent an angiogram on 08/06/17 and he underwent a PTA and directional atherectomy of the lesser distal SFA and popliteal arteries which resulted in brisk improvement in blood flow. It  was noted that he had 2 vessel runoff through the anterior tibial and peroneal. He is also been to see vascular and interventional radiologist. He was not felt to have any significant superficial venous insufficiency. Presumably is not a candidate for any ablation. It was suggested he  come here for wound care. The patient is a type II diabetic on insulin. He also has a history of venous insufficiency. ABIs on the left were noncompressible in our clinic 10/21/17; patient we admitted to the clinic last week. 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 We put Iodosorb on him after an aggressive debridement and 3 layer compression. He complained of pain in his ankle and itching with is skin in fact he scratched the area on the medial calf superiorly at the rim of our wraps and he has 2 small open areas in that location today which are new. I changed his primary dressing today to silver collagen. As noted he is already had revascularization and does not have any significant superficial venous insufficiency that would be amenable to ablation 10/28/17; patient admitted to the clinic 2 weeks ago. He has a smaller Wound. Scratch injury from last week revealed. There is large wound over the tibial area. This is smaller. Granulation looks healthy. No need for debridement. 11/04/17; the wound on the left lateral calf looks better. Improved dimensions. Surface of this looks better. We've been maintaining him and Kerlix Coban wraps. He finds this much more comfortable. Silver collagen dressing 11/11/17; left lateral Wound continues to look healthy be making progress. Using a #5 curet I removed removed nonviable skin from the surface of the wound and then necrotic debris from the wound surface. Surface of the wound continues to look healthy. He also has an open area on the left great toenail bed. We've been using topical antibiotics. 11/19/17; left  anterior lateral wound continues to look healthy but it's not closed. He also had a small wound above this on the left leg Initially traumatic wounds in the setting of significant chronic venous insufficiency and stasis dermatitis 11/25/17; left anterior wounds superiorly is closed still a small wound inferiorly. 12/02/17; left anterior tibial area. Arrives today with adherent callus. Post debridement clearly not completely closed. Hydrofera Blue under 3 layer compression. 12/09/17; left anterior tibia. Circumferential eschar however the wound bed looks stable to improved. We've been using Hydrofera Blue under 3 layer compression 12/17/17; left anterior tibia. Apparently this was felt to be closed however when the wrap was taken off there is a skin tear to reopen wounds in the same area we've been using Hydrofera Blue under 3 layer compression 12/23/17 left anterior tibia. Not close to close this week apparently the [facility] was stuck to this again. Still circumferential eschar requiring debridement. I put a contact layer on this this time under the Hydrofera Blue 12/31/17; left anterior tibia. Wound is better slight amount of hyper-granulation. Using Hydrofera Blue over Adaptic. 01/07/18; left anterior tibia. The wound had some surface eschar however after this was removed he has no open wound.he was already revascularized by Dr. [name] when he came to our clinic with atherectomy of the left SFA and popliteal artery. He was also sent to interventional radiology for venous reflux studies. He was not felt to have significant reflux but certainly has chronic venous changes of his skin with hemosiderin deposition around this area. He will definitely need to lubricate his skin and wear compression stocking and I've talked to him about this. READMISSION 05/26/2018 This is a now 71 year old man we cared for with traumatic wounds on his left anterior lower extremity. He had been previously revascularized during  that admission by Dr. [name]. Apparently in follow-up Dr. [name] noted that he  had deterioration in his arterial status. He underwent a stent placement in the distal left SFA on 04/22/2018. 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 He went back to the angiography suite on 04/30/2018 he underwent PTA and balloon angioplasty of the occluded left mid anterior tibial artery, thrombotic occlusion went from 100 to 0% which reconstitutes the posterior tibial artery. He had thrombectomy and aspiration of the peroneal artery. The stent placed in the distal SFA left SFA was still occluded. 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 He has been recuperating at [place] home in [facility] after the most recent hospitalization. He is going home tomorrow. He tells me that 2 weeks ago he traumatized the tip of his left fifth toe. He came in urgently for our review of this. This was a history of before I noted that Dr. [name] had already noted dry gangrenous changes of the left fifth toe 06/09/2018; 2-week follow-up. I did contact Dr. [name] after his last appointment and he apparently saw 1 of Dr. Irven Shelling colleagues the next day. He does not follow-up with Dr. [name] himself until Thursday of this week. 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 Nevertheless there is no evidence of infection no drainage and no pain. He had a new area that this week when we were signing him in today on the left anterior mid tibia area, this is in close proximity to the previous wound we have dealt with in this clinic. 06/23/2018; 2-week follow-up. I did not receive a recent note from Dr. [name] to review today. Our office is trying to obtain this. He is  apparently not planning to do further vascular interventions and wondered about compression to try and help with the patient's chronic venous insufficiency. However we are also concerned about the arterial flow. He arrives in clinic today with a new area on the left third toe. The areas on the calf/anterior tibia are close to closing. The left fifth toe is still mummified using Betadine. -In reviewing things with the patient he has what sounds like claudication with mild to moderate amount of activity. 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 I prescribed Levaquin over the phone while we attempted to arrange a plan of care. However the patient called yesterday to report he had low-grade fever and he came in today acutely. There is been a marked deterioration in the left third toe with spreading cellulitis up into the dorsal left foot. He was referred to the emergency room. Readmission: 06/29/2020 patient presents today for reevaluation here in our clinic he was previously treated by Dr. Dellia Nims at the latter part of 2019 in 2 the beginning of 2020. Subsequently we have not seen him since that time in the interim he did have evaluation with vein and vascular specialist specifically Dr. Anice Paganini who did perform quite extensive work for a left femoral to anterior tibial artery bypass. With that being said in the interim the patient has developed significant lymphedema and has wounds that he tells me have really never healed in regard to the incision site on the left leg. He also has multiple wounds on the feet for various reasons some of  which is that he tends to pick at his feet. Fortunately there is no signs of active infection systemically at this time he does have some wounds that are little bit deeper but most are fairly superficial he seems to have good blood flow and overall everything appears to be healthy I see no bone exposed and no obvious signs of  osteomyelitis. I do not know that he necessarily needs a x-ray at this point although that something we could consider depending on how things progress. The patient does have a history of lymphedema, diabetes, this is type II, chronic kidney disease stage III, hypertension, and history of peripheral vascular disease. 07/05/2020; patient admitted last week. Is a patient I remember from 2019 he had a spreading infection involving the left foot and we sent him to the hospital. 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 He subsequently had a left femoral to anterior tibial bypass by Dr.Cain vein and vascular. He also has severe lymphedema with chronic skin changes related to that on the left leg. The most problematic area that was new today was on the left medial great toe. This was apparently a small area last week there was purulent drainage which our intake nurse cultured. Also areas on the left medial foot and heel left lateral foot. He has 2 areas on the left medial calf left lateral calf in the setting of the severe lymphedema. 07/13/2020 on evaluation today patient appears to be doing better in my opinion compared to his last visit. The good news is there is no signs of active infection systemically and locally I do not see any signs of infection either. He did have an x-ray which was negative that is great news he had a culture which showed MRSA but at the same time he is been on the doxycycline which has helped. I do think we may want to extend this for 7 additional days 1/25; patient admitted to the clinic a few weeks ago. 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 We have been putting him under compression his edema control is a lot better but he is severe verricused skin on the left leg. He is really done quite well he still has an open area on the left medial calf and the left medial first metatarsal head. We  have been using silver collagen on the leg silver alginate on the foot 07/27/2020 upon evaluation today patient appears to be doing decently well in regard to his wounds. He still has a lot of dry skin on the left leg. Some of this is starting to peel back and I think he may be able to have them out by removing some that today. Fortunately there is no signs of active infection at this time on the left leg although on the right leg he does appear to have swelling and erythema as well as some mild warmth to touch. This does have been concerned about the possibility of cellulitis although within the differential diagnosis I do think that potentially a DVT has to be at least considered. We need to rule that out before proceeding would just call in the cellulitis. Especially since he is having pain in the posterior aspect of his calf muscle. 2/8; the patient had seen sparingly. He has severe skin changes of chronic lymphedema in the left leg thickened hyperkeratotic verrucous skin. He has an open wound on the medial part of  the left first met head left mid tibia. He also has a rim of nonepithelialized skin in the anterior mid tibia. He brought in the AmLactin lotion that was been prescribed although I am not sure under compression and its utility. There concern about cellulitis on the right lower leg the last time he was here. He was put on on antibiotics. His DVT rule out was negative. The right leg looks fine he is using his stocking on this area 08/10/2020 upon evaluation today patient appears to be doing well with regard to his leg currently. He has been tolerating the dressing changes without complication. Fortunately there is no signs of active infection which is great news. Overall very pleased with where things stand. 2/22; the patient still has an area on the medial part of the left first met his head. This looks better than when I last saw this earlier this month he has a rim of epithelialization but  still some surface debris. Mostly everything on the left leg is healed. There is still a vulnerable in the left mid tibia area. 08/30/2020 upon evaluation today patient appears to be doing much better in regard to his wounds on his foot. 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 Nonetheless I think we will address that today I Minna send in a prescription for him in that regard. Overall though there does not appear to be any signs of significant worsening. 09/07/2020 on evaluation today patient's wounds over his left foot appear to be doing excellent. I do not see any signs of infection there is some callus buildup this can require debridement for certain but overall I feel like he is managing quite nicely. He still using the AmLactin cream which has been beneficial for him as well. 3/22; left foot wound is closed. There is no open area here. He is using ammonium lactate lotion to the lower extremities to help exfoliate dry cracked skin. He has compression stockings from elastic therapy in [place]. The wound on the medial part of his left first met head is healed today. READMISSION 04/12/2021 Mr. Napoleon is a patient we know fairly well he had a prolonged stay in clinic in 2019 with wounds on his left lateral and left anterior lower extremity in the setting of chronic venous insufficiency. More recently he was here earlier this year with predominantly an area on his left foot first metatarsal head plantar and he says the plantar foot broke down on its not long after we discharged him but he did not come back here. The last few months areas of broken down on his left anterior and again the left lateral lower extremity. The leg itself is very swollen chronically enlarged a lot of hyperkeratotic dry Berry Q skin in the left lower leg. His edema extends well into the  thigh. He was seen by Dr. Donzetta Matters. He had ABIs on 03/02/2021 showing an ABI on the right of 1 with a TBI of 0.72 his ABI in the left at 1.09 TBI of 0.99. Monophasic and biphasic waveforms on the right. On the left monophasic waveforms were noted he went on to have an angiogram on 03/27/2021 this showed the aortic aortic and iliac segments were free of flow-limiting stenosis the left common femoral vein to evaluate the left femoral to anterior tibial artery bypass was unobstructed the bypass was patent without any  areas of stenosis. We discharged the patient in bilateral juxta lite stockings but very clearly that was not sufficient to control the swelling and maintain skin integrity. He is clearly going to need compression pumps. 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 This is fortunate because he is on his feet for long periods of time. 10/27; patient comes in with our intake nurse reporting copious amount of green drainage from the left anterior mid tibia the left dorsal foot and to a lesser extent the left medial mid tibia. We left the compression wrap on all week for the amount of edema in his left leg is quite a bit better. We use silver alginate as the primary dressing 11/3; edema control is good. Left anterior lower leg left medial lower leg and the plantar first metatarsal head. The left anterior lower leg required debridement. Deep tissue culture I did of this wound showed MRSA I put him on 10 days of doxycycline which she will start today. We have him in compression wraps. He has a security card and [employer] however he is retiring on November 15. We will need to then get him into a better offloading boot for the left foot perhaps a total contact cast 11/10; edema control is quite good. Left anterior and left medial lower leg wounds in the setting of chronic venous insufficiency and lymphedema. He also has a substantial area over the left plantar  first metatarsal head. 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 He has significant hypergranulation on the left plantar foot wound. The patient is a diabetic but he does not have significant PAD 11/17; edema control is quite good. Left anterior and left medial lower leg wounds look better. The really concerning area remains the area on the left plantar first metatarsal head. He has a rim of epithelialization. 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 Starting today with a forefoot off loader but . possibly a total contact cast. 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 Arrives with a very significant odor from I think all of the wounds on the left leg and the left foot. Because of this I did not put a total contact cast on him today but will could still consider this. His wife was having cataract surgery which is the reason he missed the appointment 12/6. I saw this man 5 days ago with a swelling below the popliteal fossa. I thought he actually might have a Baker's cyst however the DVT rule out study that we could arrange right away was negative the technician told me this was not a ruptured Baker's cyst. We attempted to get this aspirated by under ultrasound guidance in interventional radiology however all they did was an ultrasound however it shows an extensive fluid collection 62 x 8 x 9.4 in the left thigh and left calf. The patient states he thinks this started 8 days ago or so but he really is not complaining of any pain, fever or systemic symptoms. He has not ha 12/20; after some difficulty I managed to get the patient  into see Dr. Donzetta Matters. Eventually he was taken into the hospital  and had a drain put in the fluid collection below his left knee posteriorly extending into the posterior thigh. He still has the drain in place. Culture of this showed moderate staff aureus few Morganella and few Klebsiella he is now on doxycycline and ciprofloxacin as suggested by infectious disease he is on this for a month. The drain will remain in place until it stops draining 12/29; he comes in today with the 1 wound on his left leg and the area on the left plantar first met head significantly smaller. Both look healthy. He still has the drain in the left leg. He says he has to change this daily. Follows up with Dr. Donzetta Matters on January 11. 06/29/2021; the wounds that I am following on the left leg and left first met head continued to be quite healthy. However the area where his inferior drain is in place had copious amounts of drainage which was green in color. The wound here is larger. Follows up with Dr. Gwenlyn Saran of vein and vascular his surgeon next week as well as infectious disease. He remains on ciprofloxacin and doxycycline. He is not complaining of excessive pain in either one of the drain areas 1/12; the patient saw vascular surgery and infectious disease. Vascular surgery has left the drain in place as there was still some notable drainage still see him back in 2 weeks. Dr. Velna Ochs stop the doxycycline and ciprofloxacin and I do not believe he follows up with them at this point. Culture I did last week showed both doxycycline resistant MRSA and Pseudomonas not sensitive to ciprofloxacin although only in rare titers 1/19; the patient's wound on the left anterior lower leg is just about healed. We have continued healing of the area that was medially on the left leg. Left first plantar metatarsal head continues to get smaller. The major problem here is his 2 drain sites 1 on the left upper calf and lateral thigh. There is purulent drainage still from the left lateral thigh. I gave him antibiotics  last week but we still have recultured. He has the drain in the area I think this is eventually going to have to come out. I suspect there will be a connecting wound to heal here perhaps with improved VAc 1/26; the patient had his drain removed by vein and vascular on 1/25/. This was a large pocket of fluid in his left thigh that seem to tunnel into his left upper calf. 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 His mention his Penrose drain was removed today. He now has a tunneling wound on his left calf and left thigh. Both of these probe widely towards each other although I cannot really prove that they connect. Both wounds on his lower leg anteriorly are closed and his area over the first metatarsal head on his right foot continues to improve. We are using Hydrofera Blue here. He also saw infectious disease culture of the abscess they noted was polymicrobial with MRSA, Morganella and Klebsiella he was treated with doxycycline and ciprofloxacin for 4 weeks ending on 07/03/2021. They did not recommend any further antibiotics. Notable that while he still had the Penrose drain in place last week he had purulent drainage coming out of the inferior IandD site this grew [facility], MRSA and Pseudomonas but there does not appear to be any active infection in this area today with the drain out and he is not  systemically unwell 2/2; with regards to the drain sites the superior one on the thigh actually is closed down the one on the upper left lateral calf measures about 8 and half centimeters which is an improvement seems to be less prominent although still with a lot of drainage. The only remaining wound is over the first metatarsal head on the left foot and this looks to be continuing to improve with Hydrofera Blue. 2/9; the area on his plantar left foot continues to contract. Callus around the wound edge. The drain sites specifically have not come down in depth. We put the wound VAC on  Monday he changed the canister late last night our intake nurse reported a pocket of fluid perhaps caused by our compression wraps 2/16; continued improvement in left foot plantar wound. drainage site in the calf is not improved in terms of depth (wound vac) 2/23; continued improvement in the left foot wound over the first metatarsal head. With regards to the drain sites the area on his thigh laterally is healed however the open area on his calf is small in terms of circumference by still probes in by about 15 cm. Within using the wound VAC. Hydrofera Blue on his foot 08/24/2021: The left first metatarsal head wound continues to improve. The wound bed is healthy with just some surrounding callus. Unfortunately the open drain site on his calf remains open and tunnels at least 15 cm (the extent of a Q-tip). This is despite several weeks of wound VAC treatment. Based on reading back through the notes, there has been really no significant change in the depth of the wound, although the orifice is smaller and the more cranial wound on his thigh has closed. I suspect the tunnel tracks nearly all the way to this location. 08/31/2021: Continued improvement in the left first metatarsal head wound. There has been absolutely no improvement to the long tunnel from his open drain site on his calf. We have tried to get him into see vascular surgery sooner to consider the possibility of simply filleting the tract open and allowing it to heal from the bottom up, likely with a wound VAC. They have not yet scheduled a sooner appointment than his current mid April 09/14/2021: He was seen by vascular surgery and they took him to the operating room last week. They opened a portion of the tunnel, but did not extend the entire length of the known open subcutaneous tract. I read Dr. Claretha Cooper operative note and it is not clear from that documentation why only a portion of the tract was opened. The heaped up granulation tissue was  curetted and removed from at least some portion of the tract. They did place a wound VAC and applied an Unna boot to the leg. The ulcer on his left first metatarsal head is smaller today. The bed looks good and there is just a small amount of surrounding callus. 09/21/2021: The ulcer on his left first metatarsal head looks to be stalled. There is some callus surrounding the wound but the wound bed itself does not appear particularly dynamic. The tunnel tract on his lateral left leg seems to be roughly the same length or perhaps slightly smaller but the wound bed appears healthy with good granulation tissue. He opened up a new wound on his medial thigh and the site of a prior surgical incision. He says that he did this unconsciously in his sleep by scratching. 09/28/2021: Unfortunately, the ulcer on his left first metatarsal head has extended  underneath the callus toward the dorsum of his foot. The medial thigh wounds are roughly the same. The tunnel on his lateral left leg continues to be problematic; it is longer than we are able to actually probe with a Q-tip. I am still not certain as to why Dr. Donzetta Matters did not open this up entirely when he took the patient to the operating room. We will likely be back in the same situation with just a small superficial opening in a long unhealed tract, as the open portion is granulating in nicely. 10/02/2021: The patient was initially scheduled for a nurse visit, but we are also applying a total contact cast today. The plantar foot wound looks clean without significant accumulated callus. We have been applying Prisma silver collagen to the site. 10/05/2021: The patient is here for his first total contact cast change. We have tried using gauze packing strips in the tunnel on his lateral leg wound, but this does not seem to be working any better than the white VAC foam. The foot ulcer looks about the same with minimal periwound callus. Medial thigh wound is clean with just  some overlying eschar. 10/12/2021: The plantar foot wound is stable without any significant accumulation of periwound callus. The surface is viable with good granulation tissue. The medial thigh wounds are much smaller and are epithelializing. On the other hand, he had purulent drainage coming from the tunnel on his lateral leg. He does go back to see Dr. Donzetta Matters next week and is planning to ask him why the wound tunnel was not completely opened at the time of his most recent operation. 10/19/2021: The plantar foot wound is markedly improved and has epithelial tissue coming through the surface. The medial thigh wounds are nearly closed with just a tiny open area. He did see Dr. Donzetta Matters earlier this week and apparently they did discuss the possibility of opening the sinus tract further and enabling a wound VAC application. Apparently there are some limits as to what Dr. 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 I think if we could get the tract open to the level of the popliteal fossa, this would greatly aid in her ability to get this chart closed. That being said, however, today when I probed the tract with a Q-tip, I was not able to insert the entirety of the Q-tip as I have on previous occasions. The tunnel is shorter by about 4 cm. The surface is clean with good granulation tissue and no further episodes of purulent drainage. 10/30/2021: Last week, the patient underwent surgery and had the long tract in his leg opened. There was a rind that was debrided, according to the operative report. His medial thigh ulcers are closed. The plantar foot wound is clean with a good surface and some built up surrounding callus. 11/06/2021: The overall dimensions of the large wound on his lateral leg remain about the same, but there is good granulation tissue present and the tunneling is a little bit shorter. He has a new wound on his anterior tibial surface, in the same location where he  had a similar lesion in the past. The plantar foot wound is clean with some buildup surrounding callus. Just toward the medial aspect of his foot, however, there is an area of darkening that once debrided, revealed another opening in the skin surface. 11/13/2021: The anterior tibial surface wound is closed. The plantar foot wound has some surrounding callus buildup. The area of darkening that I debrided  last week and revealed an opening in the skin surface has closed again. The tunnel in the large wound on his lateral leg has come in by about 3 cm. There is healthy granulation tissue on the entire wound surface. 11/23/2021: The patient was out of town last week and did wet-to-dry dressings on his large wound. 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 Unfortunately, he picked open the wound on his left medial thigh. He says that it was itching and he just could not stop scratching it until it was open again. The wound on his plantar foot is smaller and has not accumulated a tremendous amount of callus. The lateral leg wound is shallower and the tunnel has also decreased in depth. There is just a little bit of slough accumulation on the surface. 11/30/2021: Another portion of his left medial thigh has been opened up. All of these wounds are fairly superficial with just a little bit of slough and eschar accumulation. The wound on his plantar foot is almost closed with just a bit of eschar and periwound callus accumulation. The lateral leg wound is nearly flush with the surrounding skin and the tunnel is markedly shallower. 12/07/2021: There is just 1 open area on his left medial thigh. It is clean with just a little bit of perimeter eschar. The wound on his plantar foot continues to contract and just has some eschar and periwound callus accumulation. The lateral leg wound is closing at the more distal aspect and the tunnel is smaller.  The surface is nearly flush with the surrounding skin and it has a good bed of granulation tissue. 12/14/2021: The thigh and foot wounds are closed. The lateral leg wound has closed over approximately half of its length. The tunnel continues to contract and the surface is now flush with the surrounding skin. The wound bed has robust granulation tissue. 12/22/2021: The thigh and foot wounds have reopened. The foot wound has a lot of callus accumulation around and over it. The thigh wound is tiny with just a little bit of slough in the wound bed. The lateral leg wound continues to contract. His vascular surgeon took the wound VAC off earlier in the week and the patient has been doing wet-to-dry dressings. There is a little slough accumulation on the surface. The tunnel is about 3 cm in depth at this point. 12/28/2021: The thigh wound is closed again. The foot wound has some callus that subsequently has peeled back exposing just a small slit of a wound. The lateral leg wound Is down to about half the size that it originally was and the tunnel is down to about half a centimeter in depth. 01/04/2022: The thigh wound remains closed. The foot wound has heavy callus overlying the wound site. Once this was debrided, the wound was found to be closed. The lateral leg wound is smaller again this week and very superficial. No tunnel could be identified. 01/12/2022: The thigh and foot wounds both remain closed. The lateral leg wound is now nearly flush with the skin surface. There is good granulation tissue present with a light layer of slough. 01/19/2022: Due to the way his wrap was placed, the patient did not change the dressing on his thigh at all and so the foam was saturated and his skin is macerated. There is a light layer of slough on the wound surface. The underlying granulation tissue is robust and healthy-appearing. He has heavy  callus buildup at the site of his first metatarsal head wound which is still  healed. 02/01/2022: He has been in silver alginate. When he removed the dressing from his thigh wound, however, some leg, superficially reopening a portion of the wound that had healed. In addition, underneath the callus at his left first metatarsal head, there appears to be a blister and the wound appears to be open again. 02/08/2022: The lateral leg wound has contracted substantially. There is eschar and a light layer of slough present. He says that it is starting to pull and is uncomfortable. On inspection, there is some puckering of the scar and the eschar is quite dry; this may account for his symptoms. On his first metatarsal head, the wound is much smaller with just some eschar on the surface. The callus has not reaccumulated. He reports that he had a blister come up on his medial thigh wound at the distal aspect. It popped and there is now an opening in his skin again. Looking back through his [place] of wound photos, there is what looks like a permanent suture just deep to this location and it may be trying to erode through. We have been using silver alginate on his wounds. 02/15/2022: The lateral leg wound is about half the size it was last week. It is clean with just a little perimeter eschar and light slough. The wound on his first metatarsal head is about the same with heavy callus overlying it. The medial thigh wound is closed again. He does have some skin changes on the top of his foot that looks potentially yeast related. 02/22/2022: The skin on the top of his foot improved with the use of a topical antifungal. The lateral leg wound continues to contract and is again smaller this week. There is a little bit of slough and eschar on the surface. The first metatarsal head wound is a little bit smaller but has reaccumulated a thick callus over the top. He decided to try to trim his toenail and ultimately took the entire nail off of his left great toe. 03/02/2022: His lateral leg wound continues  to improve, as does the wound on his left great toe. Unfortunately, it appears that somehow his foot got wet and moisture seeped in through the opening causing his skin to lift. There is a large wound now overlying his first metatarsal on both the plantar, medial, and dorsal portion of his foot. There is necrotic tissue and slough present underneath the shaggy macerated skin. 03/08/2022: The lateral leg wound is smaller again today. There is just a light layer of slough and eschar on the surface. The great toe wound is smaller again today. The first metatarsal wound is a little bit smaller today and does not look nearly as necrotic and macerated. There is still slough and nonviable tissue present. 03/15/2022: The lateral leg wound is narrower and just has a little bit of light slough buildup. The first metatarsal wound still has a fair amount of moisture affecting the periwound skin. The great toe wound is healed. Electronic Signature(s) Signed: 03/15/2022 11:29:07 AM By: Fredirick Maudlin MD FACS Entered By: Fredirick Maudlin on 03/15/2022 11:29:07 -------------------------------------------------------------------------------- Physical Exam Details Patient Name: Date of Service: [name]. 03/15/2022 10:30 A M Medical Record Number: [id number] Patient Account Number: [id number] Date of Birth/Sex: Treating RN: [birth year] (71 y.o. [name] Primary Care Provider: [name] Other Clinician: Referring Provider: Treating Provider/Extender: [name] in Treatment: 1 Constitutional Slightly hypertensive.  Slightly bradycardic, asymptomatic.. . . No acute distress.Marland Kitchen Respiratory Normal work of breathing on room air.. Notes 03/15/2022: The lateral leg wound is narrower and just has a little bit of light slough buildup. The first metatarsal wound still has a fair amount of moisture affecting the periwound skin. The great toe wound is healed. Electronic  Signature(s) Signed: 03/15/2022 11:31:04 AM By: Fredirick Maudlin MD FACS Entered By: Fredirick Maudlin on 03/15/2022 11:31:04 -------------------------------------------------------------------------------- Physician Orders Details Patient Name: Date of Service: [name]. 03/15/2022 10:30 A M Medical Record Number: [id number] Patient Account Number: [id number] Date of Birth/Sex: Treating RN: February 14, 1951 (71 y.o. [name] Primary Care Provider: [name] Other Clinician: Referring Provider: Treating Provider/Extender: [name] in Treatment: 35 Verbal / Phone Orders: No Diagnosis Coding ICD-10 Coding Code Description 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 I89.0 Lymphedema, not elsewhere classified I87.322 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verity 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 of other part of left foot with other specified severity Follow-up Appointments ppointment in 1 week. - Dr Celine Ahr - Room 1 Return A Anesthetic Wound #22 Left,Lateral Lower Leg (In clinic) Topical Lidocaine 4% applied to wound bed Wound #27 Left,Medial Foot (In clinic) Topical Lidocaine 4% applied to wound bed Bathing/ Shower/ Hygiene May shower with protection but do not get wound dressing(s) wet. - Use a cast protector so you can shower without getting your wrap(s) wet Edema Control - Lymphedema / SCD / Other Elevate legs to the level of the heart or above for 30 minutes daily and/or when sitting, a frequency of: - throughout the day Avoid standing for long periods of time. Patient to wear own compression stockings every day. - on right leg; Moisturize legs daily. - Ammonium lactate to right leg daily Off-Loading Total Contact Cast to Left Lower Extremity Wound Treatment Wound  #22 - Lower Leg Wound Laterality: Left, Lateral Cleanser: Soap and Water 3 x Per Week/30 Days Discharge Instructions: May shower and wash wound with dial antibacterial soap and water prior to dressing change. Cleanser: Wound Cleanser 3 x Per Week/30 Days Discharge Instructions: Cleanse the wound with wound cleanser prior to applying a clean dressing using gauze sponges, not tissue or cotton balls. Peri-Wound Care: Sween Lotion (Moisturizing lotion) 3 x Per Week/30 Days Discharge Instructions: Apply moisturizing lotion to the leg Prim Dressing: KerraCel Ag Gelling Fiber Dressing, 4x5 in (silver alginate) 3 x Per Week/30 Days ary Discharge Instructions: Apply silver alginate to wound bed as instructed Secondary Dressing: ABD Pad, 8x10 (Generic) 3 x Per Week/30 Days Discharge Instructions: Apply over primary dressing as directed. Secured With: Elastic Bandage 4 inch (ACE bandage) 3 x Per Week/30 Days Discharge Instructions: Secure with ACE bandage as directed. Wound #27 - Foot Wound Laterality: Left, Medial Peri-Wound Care: Sween Lotion (Moisturizing lotion) 1 x Per Week Discharge Instructions: Apply moisturizing lotion as directed Prim Dressing: Hydrofera Blue Classic Foam, 4x4 in 1 x Per Week ary Discharge Instructions: Moisten with saline prior to applying to wound bed Secondary Dressing: Woven Gauze Sponges 2x2 in 1 x Per Week Discharge Instructions: Apply over primary dressing as directed. Secondary Dressing: Zetuvit Plus 4x8 in 1 x Per Week Discharge Instructions: Apply over primary dressing as directed. Secured With: 47M Medipore H Soft Cloth Surgical T ape, 4 x 10 (in/yd) 1 x Per Week Discharge Instructions: Secure with tape as directed.  Electronic Signature(s) Signed: 03/15/2022 11:44:41 AM By: Fredirick Maudlin MD FACS Entered By: Fredirick Maudlin on 03/15/2022 11:31:16 -------------------------------------------------------------------------------- Problem List Details Patient  Name: Date of Service: [name]. 03/15/2022 10:30 A M Medical Record Number: [id number] Patient Account Number: [id number] Date of Birth/Sex: Treating RN: [birth year] (72 y.o. [name] Primary Care Provider: [name] Other Clinician: Referring Provider: Treating Provider/Extender: [name] in Treatment: 48 Active Problems ICD-10 Encounter Code Description Active Date MDM Diagnosis 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 04/12/2021 No Yes I89.0 Lymphedema, not elsewhere classified 04/12/2021 No Yes I87.322 Chronic venous hypertension (idiopathic) with inflammation of left lower 04/12/2021 No Yes extremity 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 ulcer of other part of left lower leg with other specified 04/12/2021 No Yes severity 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 of other part of left foot with other specified 04/12/2021 No Yes severity Inactive Problems ICD-10 Code Description Active Date Inactive Date E11.621 Type 2 diabetes mellitus with foot ulcer 04/12/2021 04/12/2021 E11.42 Type 2 diabetes mellitus with diabetic polyneuropathy 04/12/2021 04/12/2021 L02.416 Cutaneous abscess of left lower limb 06/13/2021 06/13/2021 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 03/15/2022 10:30 A M Medical Record Number: [id number] Patient Account Number: [id number] Date of Birth/Sex: Treating RN: 1950/07/30 (71 y.o. [name] Primary Care Provider: [name] Other Clinician: Referring Provider: Treating Provider/Extender: [name] in Treatment: 48 Subjective Chief Complaint Information obtained from Patient Left leg and foot ulcers 04/12/2021; patient is here for wounds on his left lower leg and left plantar foot over the first metatarsal head History of Present Illness (HPI) 10/11/17; Mr. Selover is a 71 year old man who tells me that in 2015 he slipped down the latter traumatizing his left leg. He developed a wound in the same spot the area that we are currently looking at. He states this closed over for the most part although he always felt it was somewhat unstable. In 2016 he hit the same area with the door of his car had this reopened. He tells me that this is never really closed although sometimes an inflow it remains open on a constant basis. He has not been using any specific dressing to this except for topical antibiotics the nature of which were not really sure. His primary doctor did send him to see Dr. [name] of interventional cardiology. He underwent an angiogram on 08/06/17 and he underwent a PTA and directional atherectomy of the lesser distal SFA and popliteal arteries which resulted in brisk improvement in blood flow. It was noted that he had 2 vessel runoff through the anterior tibial and peroneal. He is also been to see vascular and interventional radiologist. He was not felt to have any significant superficial venous insufficiency. Presumably is not a candidate for any ablation. It was suggested he come here for wound care. The patient is a type II diabetic on insulin. He also has a history of venous insufficiency. ABIs on the left were noncompressible in our clinic 10/21/17; patient we admitted to the clinic last week. 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 We put Iodosorb on him after an aggressive debridement and 3 layer compression. He complained of pain in  his ankle and itching with is skin in fact he  scratched the area on the medial calf superiorly at the rim of our wraps and he has 2 small open areas in that location today which are new. I changed his primary dressing today to silver collagen. As noted he is already had revascularization and does not have any significant superficial venous insufficiency that would be amenable to ablation 10/28/17; patient admitted to the clinic 2 weeks ago. He has a smaller Wound. Scratch injury from last week revealed. There is large wound over the tibial area. This is smaller. Granulation looks healthy. No need for debridement. 11/04/17; the wound on the left lateral calf looks better. Improved dimensions. Surface of this looks better. We've been maintaining him and Kerlix Coban wraps. He finds this much more comfortable. Silver collagen dressing 11/11/17; left lateral Wound continues to look healthy be making progress. Using a #5 curet I removed removed nonviable skin from the surface of the wound and then necrotic debris from the wound surface. Surface of the wound continues to look healthy. ooHe also has an open area on the left great toenail bed. We've been using topical antibiotics. 11/19/17; left anterior lateral wound continues to look healthy but it's not closed. ooHe also had a small wound above this on the left leg ooInitially traumatic wounds in the setting of significant chronic venous insufficiency and stasis dermatitis 11/25/17; left anterior wounds superiorly is closed still a small wound inferiorly. 12/02/17; left anterior tibial area. Arrives today with adherent callus. Post debridement clearly not completely closed. Hydrofera Blue under 3 layer compression. 12/09/17; left anterior tibia. Circumferential eschar however the wound bed looks stable to improved. We've been using Hydrofera Blue under 3 layer compression 12/17/17; left anterior tibia. Apparently this was felt to be closed however when the wrap was taken off there is a skin tear to reopen  wounds in the same area we've been using Hydrofera Blue under 3 layer compression 12/23/17 left anterior tibia. Not close to close this week apparently the [facility] was stuck to this again. Still circumferential eschar requiring debridement. I put a contact layer on this this time under the Hydrofera Blue 12/31/17; left anterior tibia. Wound is better slight amount of hyper-granulation. Using Hydrofera Blue over Adaptic. 01/07/18; left anterior tibia. The wound had some surface eschar however after this was removed he has no open wound.he was already revascularized by Dr. [name] when he came to our clinic with atherectomy of the left SFA and popliteal artery. He was also sent to interventional radiology for venous reflux studies. He was not felt to have significant reflux but certainly has chronic venous changes of his skin with hemosiderin deposition around this area. He will definitely need to lubricate his skin and wear compression stocking and I've talked to him about this. READMISSION 05/26/2018 This is a now 71 year old man we cared for with traumatic wounds on his left anterior lower extremity. He had been previously revascularized during that admission by Dr. [name]. Apparently in follow-up Dr. [name] noted that he had deterioration in his arterial status. He underwent a stent placement in the distal left SFA on 04/22/2018. 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 He went back to the angiography suite on 04/30/2018 he underwent PTA and balloon angioplasty of the occluded left mid anterior tibial artery, thrombotic occlusion went from 100 to 0% which reconstitutes the posterior tibial artery. He had thrombectomy and aspiration of the peroneal artery. The stent placed in the distal SFA  left SFA was still occluded. He was discharged on Xarelto, it was noted on the discharge summary from this hospitalization that he had gangrene at the tip of his left fifth toe and there were  expectations this would auto amputate. 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 He has been recuperating at [place] [facility] in [facility] after the most recent hospitalization. He is going home tomorrow. He tells me that 2 weeks ago he traumatized the tip of his left fifth toe. He came in urgently for our review of this. This was a history of before I noted that Dr. [name] had already noted dry gangrenous changes of the left fifth toe 06/09/2018; 2-week follow-up. I did contact Dr. [name] after his last appointment and he apparently saw 1 of Dr. Irven Shelling colleagues the next day. He does not follow-up with Dr. [name] himself until Thursday of this week. 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 Nevertheless there is no evidence of infection no drainage and no pain. He had a new area that this week when we were signing him in today on the left anterior mid tibia area, this is in close proximity to the previous wound we have dealt with in this clinic. 06/23/2018; 2-week follow-up. I did not receive a recent note from Dr. [name] to review today. Our office is trying to obtain this. He is apparently not planning to do further vascular interventions and wondered about compression to try and help with the patient's chronic venous insufficiency. However we are also concerned about the arterial flow. ooHe arrives in clinic today with a new area on the left third toe. The areas on the calf/anterior tibia are close to closing. The left fifth toe is still mummified using Betadine. -In reviewing things with the patient he has what sounds like claudication with mild to moderate amount of activity. 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 I prescribed Levaquin over the phone while we attempted to arrange a plan of care. However the patient called yesterday to report he had low-grade fever and he came in today  acutely. There is been a marked deterioration in the left third toe with spreading cellulitis up into the dorsal left foot. He was referred to the emergency room. Readmission: 06/29/2020 patient presents today for reevaluation here in our clinic he was previously treated by Dr. Dellia Nims at the latter part of 2019 in 2 the beginning of 2020. Subsequently we have not seen him since that time in the interim he did have evaluation with vein and vascular specialist specifically Dr. Anice Paganini who did perform quite extensive work for a left femoral to anterior tibial artery bypass. With that being said in the interim the patient has developed significant lymphedema and has wounds that he tells me have really never healed in regard to the incision site on the left leg. He also has multiple wounds on the feet for various reasons some of which is that he tends to pick at his feet. Fortunately there is no signs of active infection systemically at this time he does have some wounds that are little bit deeper but most are fairly superficial he seems to have good blood flow and overall everything appears to be healthy I see no bone exposed and no obvious signs of osteomyelitis. I do not know that he necessarily needs a x-ray at this point although  that something we could consider depending on how things progress. The patient does have a history of lymphedema, diabetes, this is type II, chronic kidney disease stage III, hypertension, and history of peripheral vascular disease. 07/05/2020; patient admitted last week. Is a patient I remember from 2019 he had a spreading infection involving the left foot and we sent him to the hospital. 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 He subsequently had a left femoral to anterior tibial bypass by Dr.Cain vein and vascular. He also has severe lymphedema with chronic skin changes related to that on the left leg. The most problematic area  that was new today was on the left medial great toe. This was apparently a small area last week there was purulent drainage which our intake nurse cultured. Also areas on the left medial foot and heel left lateral foot. He has 2 areas on the left medial calf left lateral calf in the setting of the severe lymphedema. 07/13/2020 on evaluation today patient appears to be doing better in my opinion compared to his last visit. The good news is there is no signs of active infection systemically and locally I do not see any signs of infection either. He did have an x-ray which was negative that is great news he had a culture which showed MRSA but at the same time he is been on the doxycycline which has helped. I do think we may want to extend this for 7 additional days 1/25; patient admitted to the clinic a few weeks ago. 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 We have been putting him under compression his edema control is a lot better but he is severe verricused skin on the left leg. He is really done quite well he still has an open area on the left medial calf and the left medial first metatarsal head. We have been using silver collagen on the leg silver alginate on the foot 07/27/2020 upon evaluation today patient appears to be doing decently well in regard to his wounds. He still has a lot of dry skin on the left leg. Some of this is starting to peel back and I think he may be able to have them out by removing some that today. Fortunately there is no signs of active infection at this time on the left leg although on the right leg he does appear to have swelling and erythema as well as some mild warmth to touch. This does have been concerned about the possibility of cellulitis although within the differential diagnosis I do think that potentially a DVT has to be at least considered. We need to rule that out before proceeding would just call in the cellulitis.  Especially since he is having pain in the posterior aspect of his calf muscle. 2/8; the patient had seen sparingly. He has severe skin changes of chronic lymphedema in the left leg thickened hyperkeratotic verrucous skin. He has an open wound on the medial part of the left first met head left mid tibia. He also has a rim of nonepithelialized skin in the anterior mid tibia. He brought in the AmLactin lotion that was been prescribed although I am not sure under compression and its utility. There concern about cellulitis on the right lower leg the last time he was here. He was put on on antibiotics. His DVT rule out was negative. The right leg looks fine he is using  his stocking on this area 08/10/2020 upon evaluation today patient appears to be doing well with regard to his leg currently. He has been tolerating the dressing changes without complication. Fortunately there is no signs of active infection which is great news. Overall very pleased with where things stand. 2/22; the patient still has an area on the medial part of the left first met his head. This looks better than when I last saw this earlier this month he has a rim of epithelialization but still some surface debris. Mostly everything on the left leg is healed. There is still a vulnerable in the left mid tibia area. 08/30/2020 upon evaluation today patient appears to be doing much better in regard to his wounds on his foot. 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 Nonetheless I think we will address that today I Minna send in a prescription for him in that regard. Overall though there does not appear to be any signs of significant worsening. 09/07/2020 on evaluation today patient's wounds over his left foot appear to be doing excellent. I do not see any signs of infection there is some callus buildup this can  require debridement for certain but overall I feel like he is managing quite nicely. He still using the AmLactin cream which has been beneficial for him as well. 3/22; left foot wound is closed. There is no open area here. He is using ammonium lactate lotion to the lower extremities to help exfoliate dry cracked skin. He has compression stockings from elastic therapy in [place]. The wound on the medial part of his left first met head is healed today. READMISSION 04/12/2021 [name] is a patient we know fairly well he had a prolonged stay in clinic in 2019 with wounds on his left lateral and left anterior lower extremity in the setting of chronic venous insufficiency. More recently he was here earlier this year with predominantly an area on his left foot first metatarsal head plantar and he says the plantar foot broke down on its not long after we discharged him but he did not come back here. The last few months areas of broken down on his left anterior and again the left lateral lower extremity. The leg itself is very swollen chronically enlarged a lot of hyperkeratotic dry Berry Q skin in the left lower leg. His edema extends well into the thigh. He was seen by Dr. Donzetta Matters. He had ABIs on 03/02/2021 showing an ABI on the right of 1 with a TBI of 0.72 his ABI in the left at 1.09 TBI of 0.99. Monophasic and biphasic waveforms on the right. On the left monophasic waveforms were noted he went on to have an angiogram on 03/27/2021 this showed the aortic aortic and iliac segments were free of flow-limiting stenosis the left common femoral vein to evaluate the left femoral to anterior tibial artery bypass was unobstructed the bypass was patent without any areas of stenosis. We discharged the patient in bilateral juxta lite stockings but very clearly that was not sufficient to control the swelling and maintain skin integrity. He is clearly going to need compression pumps. The patient is a security guard at a  [employer] but he is telling me he is going to retire in 25 days. This is fortunate because he is on his feet for long periods of time. 10/27; patient comes in with  our intake nurse reporting copious amount of green drainage from the left anterior mid tibia the left dorsal foot and to a lesser extent the left medial mid tibia. We left the compression wrap on all week for the amount of edema in his left leg is quite a bit better. We use silver alginate as the primary dressing 11/3; edema control is good. Left anterior lower leg left medial lower leg and the plantar first metatarsal head. The left anterior lower leg required debridement. Deep tissue culture I did of this wound showed MRSA I put him on 10 days of doxycycline which she will start today. We have him in compression wraps. He has a security card and [employer] however he is retiring on November 15. We will need to then get him into a better offloading boot for the left foot perhaps a total contact cast 11/10; edema control is quite good. Left anterior and left medial lower leg wounds in the setting of chronic venous insufficiency and lymphedema. He also has a substantial area over the left plantar first metatarsal head. 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 He has significant hypergranulation on the left plantar foot wound. The patient is a diabetic but he does not have significant PAD 11/17; edema control is quite good. Left anterior and left medial lower leg wounds look better. The really concerning area remains the area on the left plantar first metatarsal head. He has a rim of epithelialization. 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 Starting today with a forefoot off loader but . possibly a total contact cast. He already has had amputation of all  his toes except the big toe on the left 12/1; he missed his appointment last week therefore the same wrap was on for 2 weeks. Arrives with a very significant odor from I think all of the wounds on the left leg and the left foot. Because of this I did not put a total contact cast on him today but will could still consider this. His wife was having cataract surgery which is the reason he missed the appointment 12/6. I saw this man 5 days ago with a swelling below the popliteal fossa. I thought he actually might have a Baker's cyst however the DVT rule out study that we could arrange right away was negative the technician told me this was not a ruptured Baker's cyst. We attempted to get this aspirated by under ultrasound guidance in interventional radiology however all they did was an ultrasound however it shows an extensive fluid collection 62 x 8 x 9.4 in the left thigh and left calf. The patient states he thinks this started 8 days ago or so but he really is not complaining of any pain, fever or systemic symptoms. He has not ha 12/20; after some difficulty I managed to get the patient into see Dr. Donzetta Matters. Eventually he was taken into the hospital and had a drain put in the fluid collection below his left knee posteriorly extending into the posterior thigh. He still has the drain in place. Culture of this showed moderate staff aureus few Morganella and few Klebsiella he is now on doxycycline and ciprofloxacin as suggested by infectious disease he is on this for a month. The drain will remain in place until it  stops draining 12/29; he comes in today with the 1 wound on his left leg and the area on the left plantar first met head significantly smaller. Both look healthy. He still has the drain in the left leg. He says he has to change this daily. Follows up with Dr. Donzetta Matters on January 11. 06/29/2021; the wounds that I am following on the left leg and left first met head continued to be quite healthy. However the  area where his inferior drain is in place had copious amounts of drainage which was green in color. The wound here is larger. Follows up with Dr. Gwenlyn Saran of vein and vascular his surgeon next week as well as infectious disease. He remains on ciprofloxacin and doxycycline. He is not complaining of excessive pain in either one of the drain areas 1/12; the patient saw vascular surgery and infectious disease. Vascular surgery has left the drain in place as there was still some notable drainage still see him back in 2 weeks. Dr. Velna Ochs stop the doxycycline and ciprofloxacin and I do not believe he follows up with them at this point. Culture I did last week showed both doxycycline resistant MRSA and Pseudomonas not sensitive to ciprofloxacin although only in rare titers 1/19; the patient's wound on the left anterior lower leg is just about healed. We have continued healing of the area that was medially on the left leg. Left first plantar metatarsal head continues to get smaller. The major problem here is his 2 drain sites 1 on the left upper calf and lateral thigh. There is purulent drainage still from the left lateral thigh. I gave him antibiotics last week but we still have recultured. He has the drain in the area I think this is eventually going to have to come out. I suspect there will be a connecting wound to heal here perhaps with improved VAc 1/26; the patient had his drain removed by vein and vascular on 1/25/. This was a large pocket of fluid in his left thigh that seem to tunnel into his left upper calf. 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 His mention his Penrose drain was removed today. He now has a tunneling wound on his left calf and left thigh. Both of these probe widely towards each other although I cannot really prove that they connect. Both wounds on his lower leg anteriorly are closed and his area over the first metatarsal head on his right foot continues to improve.  We are using Hydrofera Blue here. He also saw infectious disease culture of the abscess they noted was polymicrobial with MRSA, Morganella and Klebsiella he was treated with doxycycline and ciprofloxacin for 4 weeks ending on 07/03/2021. They did not recommend any further antibiotics. Notable that while he still had the Penrose drain in place last week he had purulent drainage coming out of the inferior IandD site this grew [facility], MRSA and Pseudomonas but there does not appear to be any active infection in this area today with the drain out and he is not systemically unwell 2/2; with regards to the drain sites the superior one on the thigh actually is closed down the one on the upper left lateral calf measures about 8 and half centimeters which is an improvement seems to be less prominent although still with a lot of drainage. The only remaining wound is over the first metatarsal head on the left foot and this looks to be continuing to improve with Hydrofera Blue. 2/9; the  area on his plantar left foot continues to contract. Callus around the wound edge. The drain sites specifically have not come down in depth. We put the wound VAC on Monday he changed the canister late last night our intake nurse reported a pocket of fluid perhaps caused by our compression wraps 2/16; continued improvement in left foot plantar wound. drainage site in the calf is not improved in terms of depth (wound vac) 2/23; continued improvement in the left foot wound over the first metatarsal head. With regards to the drain sites the area on his thigh laterally is healed however the open area on his calf is small in terms of circumference by still probes in by about 15 cm. Within using the wound VAC. Hydrofera Blue on his foot 08/24/2021: The left first metatarsal head wound continues to improve. The wound bed is healthy with just some surrounding callus. Unfortunately the open drain site on his calf remains open and  tunnels at least 15 cm (the extent of a Q-tip). This is despite several weeks of wound VAC treatment. Based on reading back through the notes, there has been really no significant change in the depth of the wound, although the orifice is smaller and the more cranial wound on his thigh has closed. I suspect the tunnel tracks nearly all the way to this location. 08/31/2021: Continued improvement in the left first metatarsal head wound. There has been absolutely no improvement to the long tunnel from his open drain site on his calf. We have tried to get him into see vascular surgery sooner to consider the possibility of simply filleting the tract open and allowing it to heal from the bottom up, likely with a wound VAC. They have not yet scheduled a sooner appointment than his current mid April 09/14/2021: He was seen by vascular surgery and they took him to the operating room last week. They opened a portion of the tunnel, but did not extend the entire length of the known open subcutaneous tract. I read Dr. Claretha Cooper operative note and it is not clear from that documentation why only a portion of the tract was opened. The heaped up granulation tissue was curetted and removed from at least some portion of the tract. They did place a wound VAC and applied an Unna boot to the leg. The ulcer on his left first metatarsal head is smaller today. The bed looks good and there is just a small amount of surrounding callus. 09/21/2021: The ulcer on his left first metatarsal head looks to be stalled. There is some callus surrounding the wound but the wound bed itself does not appear particularly dynamic. The tunnel tract on his lateral left leg seems to be roughly the same length or perhaps slightly smaller but the wound bed appears healthy with good granulation tissue. He opened up a new wound on his medial thigh and the site of a prior surgical incision. He says that he did this unconsciously in his sleep by  scratching. 09/28/2021: Unfortunately, the ulcer on his left first metatarsal head has extended underneath the callus toward the dorsum of his foot. The medial thigh wounds are roughly the same. The tunnel on his lateral left leg continues to be problematic; it is longer than we are able to actually probe with a Q-tip. I am still not certain as to why Dr. Donzetta Matters did not open this up entirely when he took the patient to the operating room. We will likely be back in the same situation with  just a small superficial opening in a long unhealed tract, as the open portion is granulating in nicely. 10/02/2021: The patient was initially scheduled for a nurse visit, but we are also applying a total contact cast today. The plantar foot wound looks clean without significant accumulated callus. We have been applying Prisma silver collagen to the site. 10/05/2021: The patient is here for his first total contact cast change. We have tried using gauze packing strips in the tunnel on his lateral leg wound, but this does not seem to be working any better than the white VAC foam. The foot ulcer looks about the same with minimal periwound callus. Medial thigh wound is clean with just some overlying eschar. 10/12/2021: The plantar foot wound is stable without any significant accumulation of periwound callus. The surface is viable with good granulation tissue. The medial thigh wounds are much smaller and are epithelializing. On the other hand, he had purulent drainage coming from the tunnel on his lateral leg. He does go back to see Dr. Donzetta Matters next week and is planning to ask him why the wound tunnel was not completely opened at the time of his most recent operation. 10/19/2021: The plantar foot wound is markedly improved and has epithelial tissue coming through the surface. The medial thigh wounds are nearly closed with just a tiny open area. He did see Dr. Donzetta Matters earlier this week and apparently they did discuss the possibility of  opening the sinus tract further and enabling a wound VAC application. Apparently there are some limits as to what Dr. 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 I think if we could get the tract open to the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

## 2022-03-16 ENCOUNTER — Encounter: Payer: Self-pay | Admitting: Vascular Surgery

## 2022-03-20 ENCOUNTER — Ambulatory Visit (INDEPENDENT_AMBULATORY_CARE_PROVIDER_SITE_OTHER): Payer: Medicare Other | Admitting: Nurse Practitioner

## 2022-03-20 ENCOUNTER — Encounter: Payer: Self-pay | Admitting: Nurse Practitioner

## 2022-03-20 VITALS — BP 135/86 | HR 71 | Ht 73.5 in | Wt 273.0 lb

## 2022-03-20 DIAGNOSIS — I10 Essential (primary) hypertension: Secondary | ICD-10-CM

## 2022-03-20 DIAGNOSIS — E1165 Type 2 diabetes mellitus with hyperglycemia: Secondary | ICD-10-CM | POA: Diagnosis not present

## 2022-03-20 DIAGNOSIS — Z794 Long term (current) use of insulin: Secondary | ICD-10-CM | POA: Diagnosis not present

## 2022-03-20 DIAGNOSIS — E782 Mixed hyperlipidemia: Secondary | ICD-10-CM | POA: Diagnosis not present

## 2022-03-20 LAB — POCT GLYCOSYLATED HEMOGLOBIN (HGB A1C): Hemoglobin A1C: 9.4 % — AB (ref 4.0–5.6)
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
## 2022-03-21 ENCOUNTER — Other Ambulatory Visit (HOSPITAL_COMMUNITY): Payer: BC Managed Care – PPO

## 2022-03-21 ENCOUNTER — Encounter: Payer: Self-pay | Admitting: Nurse Practitioner

## 2022-03-21 ENCOUNTER — Encounter (HOSPITAL_COMMUNITY): Payer: BC Managed Care – PPO

## 2022-03-21 ENCOUNTER — Ambulatory Visit: Payer: BC Managed Care – PPO

## 2022-03-22 ENCOUNTER — Encounter (HOSPITAL_BASED_OUTPATIENT_CLINIC_OR_DEPARTMENT_OTHER): Payer: Medicare Other | Admitting: General Surgery

## 2022-03-22 DIAGNOSIS — E11621 Type 2 diabetes mellitus with foot ulcer: Secondary | ICD-10-CM | POA: Diagnosis not present

## 2022-03-22 NOTE — Progress Notes (Signed)
TYLYN, DERWIN ([id number]) Visit Report for 03/22/2022 Arrival Information Details Patient Name: Date of Service: JARIS, KOHLES 03/22/2022 10:30 A M Medical Record Number: [id number] Patient Account Number: [id number] Date of Birth/Sex: Treating RN: [birth year] (71 y.o. [name] Primary Care Lamya Lausch: [name] Other Clinician: Referring Jamarious Febo: Treating Jasaiah Karwowski/Extender: [name] in Treatment: 50 Visit Information History Since Last Visit Added or deleted any medications: No Patient Arrived: Ambulatory Any new allergies or adverse reactions: No Arrival Time: 10:38 Had a fall or experienced change in No Accompanied By: self activities of daily living that may affect Transfer Assistance: None risk of falls: Patient Identification Verified: Yes Signs or symptoms of abuse/neglect since last visito No Secondary Verification Process Completed: Yes Hospitalized since last visit: No Patient Requires Transmission-Based Precautions: No Implantable device outside of the clinic excluding No Patient Has Alerts: Yes cellular tissue based products placed in the center since last visit: Has Dressing in Place as Prescribed: Yes Has Footwear/Offloading in Place as Prescribed: Yes Left: T Contact Cast otal Pain Present Now: No Electronic Signature(s) Signed: 03/22/2022 5:24:26 PM By: Baruch Gouty RN, BSN Entered By: Baruch Gouty on 03/22/2022 10:42:46 -------------------------------------------------------------------------------- Encounter Discharge Information Details Patient Name: Date of Service: [name] 03/22/2022 10:30 A M Medical Record Number: [id number] Patient Account Number: [id number] Date of Birth/Sex: Treating RN: [birth year] (71 y.o. [name] Primary Care Tacora Athanas: [name] Other Clinician: Referring Kadie Balestrieri: Treating Jamiya Nims/Extender: [name] in Treatment: 78 Encounter  Discharge Information Items Post Procedure Vitals Discharge Condition: Stable Temperature (F): 98.1 Ambulatory Status: Ambulatory Pulse (bpm): 58 Discharge Destination: Home Respiratory Rate (breaths/min): 18 Transportation: Private Auto Blood Pressure (mmHg): 162/86 Accompanied By: self Schedule Follow-up Appointment: Yes Clinical Summary of Care: Patient Declined Electronic Signature(s) Signed: 03/22/2022 5:24:26 PM By: Baruch Gouty RN, BSN Entered By: Baruch Gouty on 03/22/2022 13:15:41 -------------------------------------------------------------------------------- Lower Extremity Assessment Details Patient Name: Date of Service: [name] 03/22/2022 10:30 A M Medical Record Number: [id number] Patient Account Number: [id number] Date of Birth/Sex: Treating RN: 04-01-1951 (71 y.o. [name] Primary Care Karuna Balducci: [name] Other Clinician: Referring Emir Nack: Treating Naleah Kofoed/Extender: [name] in Treatment: 49 Edema Assessment Assessed: [name]: No] [Right: No] Edema: [Left: Ye] [Right: s] Calf Left: Right: Point of Measurement: 41 cm From Medial Instep 45 cm Ankle Left: Right: Point of Measurement: 10 cm From Medial Instep 28.5 cm Vascular Assessment Pulses: Dorsalis Pedis Palpable: [Left:No] Electronic Signature(s) Signed: 03/22/2022 5:24:26 PM By: Baruch Gouty RN, BSN Entered By: Baruch Gouty on 03/22/2022 10:58:26 -------------------------------------------------------------------------------- Multi Wound Chart Details Patient Name: Date of Service: [name] 03/22/2022 10:30 A M Medical Record Number: [id number] Patient Account Number: [id number] Date of Birth/Sex: Treating RN: [birth year] (71 y.o. M) Primary Care Fabian Walder: [name] Other Clinician: Referring Daniyal Tabor: Treating Shenia Alan/Extender: [name] in Treatment: 49 Vital Signs Height(in): 74 Capillary  Blood Glucose(mg/dl): 98 Weight(lbs): 238 Pulse(bpm): 59 Body Mass Index(BMI): 30.6 Blood Pressure(mmHg): 162/86 Temperature(F): 98.1 Respiratory Rate(breaths/min): 18 Photos: [22:Left, Lateral Lower Leg] [27:Left, Medial Foot] [N/A:N/A N/A] Wound Location: [22:Bump] [27:Blister] [N/A:N/A] Wounding Event: [22:Cyst] [27:Diabetic Wound/Ulcer of the Lower] [N/A:N/A] Primary Etiology: [22:Glaucoma, Sleep Apnea,] [27:Extremity Glaucoma, Sleep Apnea,] [N/A:N/A] Comorbid History: [22:Hypertension, Peripheral Arterial Disease, Peripheral Venous Disease, Disease, Peripheral Venous Disease, Type II Diabetes, Gout, Osteoarthritis, Type II Diabetes, Gout, Osteoarthritis, Neuropathy 06/03/2021] [27:Hypertension,  Peripheral Arterial Neuropathy 02/01/2022] [N/A:N/A] Date Acquired: [22:41] [27:7] [N/A:N/A] Weeks of Treatment: [22:Open] [27:Open] [N/A:N/A] Wound  Status: [22:No] [27:No] [N/A:N/A] Wound Recurrence: [22:Yes] [27:No] [N/A:N/A] Clustered Wound: [22:3] [27:N/A] [N/A:N/A] Clustered Quantity: [22:2x1.4x0.1] [27:1.9x2.4x0.1] [N/A:N/A] Measurements L x W x D (cm) [22:2.199] [27:3.581] [N/A:N/A] A (cm) : rea [22:0.22] [25:6.389] [N/A:N/A] Volume (cm) : [22:-33.40%] [27:-250.70%] [N/A:N/A] % Reduction in A [22:rea: 83.30%] [27:-251.00%] [N/A:N/A] % Reduction in Volume: [22:Full Thickness With Exposed Support Grade 1] [N/A:N/A] Classification: [22:Structures Medium] [27:Medium] [N/A:N/A] Exudate A mount: [22:Serosanguineous] [27:Serosanguineous] [N/A:N/A] Exudate Type: [22:red, brown] [27:red, brown] [N/A:N/A] Exudate Color: [22:Fibrotic scar, thickened scar] [27:Distinct, outline attached] [N/A:N/A] Wound Margin: [22:Medium (34-66%)] [27:Large (67-100%)] [N/A:N/A] Granulation A mount: [22:Red, Pale] [27:Red, Friable] [N/A:N/A] Granulation Quality: [22:Medium (34-66%)] [27:None Present (0%)] [N/A:N/A] Necrotic A mount: [22:Fat Layer (Subcutaneous Tissue): Yes Fat Layer (Subcutaneous Tissue):  Yes N/A] Exposed Structures: [22:Fascia: No Tendon: No Muscle: No Joint: No Bone: No Medium (34-66%)] [27:Fascia: No Tendon: No Muscle: No Joint: No Bone: No Small (1-33%)] [N/A:N/A] Epithelialization: [22:Debridement - Selective/Open Wound N/A] [N/A:N/A] Debridement: Pre-procedure Verification/Time Out 11:10 [27:N/A] [N/A:N/A] Taken: [22:Necrotic/Eschar, Slough] [27:N/A] [N/A:N/A] Tissue Debrided: [22:Non-Viable Tissue] [27:N/A] [N/A:N/A] Level: [22:2.8] [27:N/A] [N/A:N/A] Debridement A (sq cm): [22:rea Curette] [27:N/A] [N/A:N/A] Instrument: [22:Minimum] [27:N/A] [N/A:N/A] Bleeding: [22:Pressure] [27:N/A] [N/A:N/A] Hemostasis A chieved: [22:0] [27:N/A] [N/A:N/A] Procedural Pain: [22:0] [27:N/A] [N/A:N/A] Post Procedural Pain: [22:Procedure was tolerated well] [27:N/A] [N/A:N/A] Debridement Treatment Response: [22:2x1.4x0.1] [27:N/A] [N/A:N/A] Post Debridement Measurements L x W x D (cm) [22:0.22] [27:N/A] [N/A:N/A] Post Debridement Volume: (cm) [22:Debridement] [27:T Contact Cast otal] [N/A:N/A] Treatment Notes Electronic Signature(s) Signed: 03/22/2022 11:15:20 AM By: Fredirick Maudlin MD FACS Entered By: Fredirick Maudlin on 03/22/2022 11:15:20 -------------------------------------------------------------------------------- Multi-Disciplinary Care Plan Details Patient Name: Date of Service: [name] 03/22/2022 10:30 A M Medical Record Number: [id number] Patient Account Number: [id number] Date of Birth/Sex: Treating RN: 10-25-1950 (70 y.o. [name] Primary Care Lundon Rosier: [name] Other Clinician: Referring Solita Macadam: Treating Fayette Gasner/Extender: [name] in Treatment: 67 Multidisciplinary Care Plan reviewed with physician Active Inactive Venous Leg Ulcer Nursing Diagnoses: Knowledge deficit related to disease process and management Potential for venous Insuffiency (use before diagnosis confirmed) Goals: Patient will maintain  optimal edema control Date Initiated: 07/27/2021 Target Resolution Date: 04/12/2022 Goal Status: Active Interventions: Assess peripheral edema status every visit. Treatment Activities: Therapeutic compression applied : 07/27/2021 Notes: Wound/Skin Impairment Nursing Diagnoses: Impaired tissue integrity Knowledge deficit related to ulceration/compromised skin integrity Goals: Patient will have a decrease in wound volume by X% from date: (specify in notes) Date Initiated: 04/12/2021 Date Inactivated: 01/04/2022 Target Resolution Date: 04/23/2021 Goal Status: Met Patient/caregiver will verbalize understanding of skin care regimen Date Initiated: 01/04/2022 Target Resolution Date: 03/30/2022 Goal Status: Active Ulcer/skin breakdown will have a volume reduction of 30% by week 4 Date Initiated: 04/12/2021 Date Inactivated: 04/27/2021 Target Resolution Date: 04/27/2021 Goal Status: Unmet Unmet Reason: infection Ulcer/skin breakdown will have a volume reduction of 50% by week 8 Date Initiated: 04/27/2021 Date Inactivated: 06/29/2021 Target Resolution Date: 06/24/2021 Goal Status: Met Interventions: Assess patient/caregiver ability to obtain necessary supplies Assess patient/caregiver ability to perform ulcer/skin care regimen upon admission and as needed Assess ulceration(s) every visit Notes: Electronic Signature(s) Signed: 03/22/2022 5:24:26 PM By: Baruch Gouty RN, BSN Entered By: Baruch Gouty on 03/22/2022 11:04:36 -------------------------------------------------------------------------------- Pain Assessment Details Patient Name: Date of Service: [name] 03/22/2022 10:30 A M Medical Record Number: [id number] Patient Account Number: [id number] Date of Birth/Sex: Treating RN: 1951/01/28 (71 y.o. [name] Primary Care Garrett Bowring: [name] Other Clinician: Referring Montavious Wierzba: Treating Deshanae Lindo/Extender: [name] in Treatment:  [phone]  Active Problems Location of Pain Severity and Description of Pain Patient Has Paino No Site Locations Rate the pain. Rate the pain. Current Pain Level: 0 Pain Management and Medication Current Pain Management: Electronic Signature(s) Signed: 03/22/2022 5:24:26 PM By: Baruch Gouty RN, BSN Entered By: Baruch Gouty on 03/22/2022 10:44:09 -------------------------------------------------------------------------------- Patient/Caregiver Education Details Patient Name: Date of Service: [name] 9/28/2023andnbsp10:30 [place] Record Number: [id number] Patient Account Number: [id number] Date of Birth/Gender: Treating RN: [birth year] (71 y.o. [name] Primary Care Physician: [name] Other Clinician: Referring Physician: Treating Physician/Extender: [name] in Treatment: 15 Education Assessment Education Provided To: Patient Education Topics Provided Offloading: Methods: Explain/Verbal Responses: Reinforcements needed, State content correctly Wound/Skin Impairment: Methods: Explain/Verbal Responses: Reinforcements needed, State content correctly Electronic Signature(s) Signed: 03/22/2022 5:24:26 PM By: Baruch Gouty RN, BSN Entered By: Baruch Gouty on 03/22/2022 11:05:02 -------------------------------------------------------------------------------- Wound Assessment Details Patient Name: Date of Service: [name] 03/22/2022 10:30 A M Medical Record Number: [id number] Patient Account Number: [id number] Date of Birth/Sex: Treating RN: 03-Jul-1950 (71 y.o. [name] Primary Care Sondra Blixt: [name] Other Clinician: Referring Merrick Maggio: Treating Madylin Fairbank/Extender: [name] in Treatment: 49 Wound Status Wound Number: 22 Primary Cyst Etiology: Wound Location: Left, Lateral Lower Leg Wound Open Wounding Event: Bump Status: Date Acquired: 06/03/2021 Comorbid  Glaucoma, Sleep Apnea, Hypertension, Peripheral Arterial Disease, Weeks Of Treatment: 41 History: Peripheral Venous Disease, Type II Diabetes, Gout, Osteoarthritis, Clustered Wound: Yes Neuropathy Photos Wound Measurements Length: (cm) 2 Width: (cm) 1.4 Depth: (cm) 0.1 Clustered Quantity: 3 Area: (cm) 2.199 Volume: (cm) 0.22 % Reduction in Area: -33.4% % Reduction in Volume: 83.3% Epithelialization: Medium (34-66%) Tunneling: No Undermining: No Wound Description Classification: Full Thickness With Exposed Support Structures Wound Margin: Fibrotic scar, thickened scar Exudate Amount: Medium Exudate Type: Serosanguineous Exudate Color: red, brown Foul Odor After Cleansing: No Slough/Fibrino Yes Wound Bed Granulation Amount: Medium (34-66%) Exposed Structure Granulation Quality: Red, Pale Fascia Exposed: No Necrotic Amount: Medium (34-66%) Fat Layer (Subcutaneous Tissue) Exposed: Yes Necrotic Quality: Adherent Slough Tendon Exposed: No Muscle Exposed: No Joint Exposed: No Bone Exposed: No Treatment Notes Wound #22 (Lower Leg) Wound Laterality: Left, Lateral Cleanser Soap and Water Discharge Instruction: May shower and wash wound with dial antibacterial soap and water prior to dressing change. Wound Cleanser Discharge Instruction: Cleanse the wound with wound cleanser prior to applying a clean dressing using gauze sponges, not tissue or cotton balls. Peri-Wound Care Sween Lotion (Moisturizing lotion) Discharge Instruction: Apply moisturizing lotion to the leg Topical Primary Dressing KerraCel Ag Gelling Fiber Dressing, 4x5 in (silver alginate) Discharge Instruction: Apply silver alginate to wound bed as instructed Secondary Dressing ABD Pad, 8x10 Discharge Instruction: Apply over primary dressing as directed. Secured With Elastic Bandage 4 inch (ACE bandage) Discharge Instruction: Secure with ACE bandage as directed. Compression Wrap Compression  Stockings Add-Ons Electronic Signature(s) Signed: 03/22/2022 5:24:26 PM By: Baruch Gouty RN, BSN Entered By: Baruch Gouty on 03/22/2022 11:03:16 -------------------------------------------------------------------------------- Wound Assessment Details Patient Name: Date of Service: [name] 03/22/2022 10:30 A M Medical Record Number: [id number] Patient Account Number: [id number] Date of Birth/Sex: Treating RN: 1951-04-12 (71 y.o. [name] Primary Care Saaya Procell: [name] Other Clinician: Referring Hollis Tuller: Treating Christabella Alvira/Extender: [name] in Treatment: 49 Wound Status Wound Number: 27 Primary Diabetic Wound/Ulcer of the Lower Extremity Etiology: Wound Location: Left, Medial Foot Wound Open Wounding Event: Blister Status: Date Acquired: 02/01/2022 Comorbid Glaucoma, Sleep Apnea, Hypertension, Peripheral Arterial Disease,  Weeks Of Treatment: 7 History: Peripheral Venous Disease, Type II Diabetes, Gout, Osteoarthritis, Clustered Wound: No Neuropathy Photos Wound Measurements Length: (cm) 1.9 Width: (cm) 2.4 Depth: (cm) 0.1 Area: (cm) 3.581 Volume: (cm) 0.358 % Reduction in Area: -250.7% % Reduction in Volume: -251% Epithelialization: Small (1-33%) Tunneling: No Undermining: No Wound Description Classification: Grade 1 Wound Margin: Distinct, outline attached Exudate Amount: Medium Exudate Type: Serosanguineous Exudate Color: red, brown Foul Odor After Cleansing: No Slough/Fibrino No Wound Bed Granulation Amount: Large (67-100%) Exposed Structure Granulation Quality: Red, Friable Fascia Exposed: No Necrotic Amount: None Present (0%) Fat Layer (Subcutaneous Tissue) Exposed: Yes Tendon Exposed: No Muscle Exposed: No Joint Exposed: No Bone Exposed: No Treatment Notes Wound #27 (Foot) Wound Laterality: Left, Medial Cleanser Peri-Wound Care Sween Lotion (Moisturizing lotion) Discharge Instruction: Apply  moisturizing lotion as directed Topical Ketoconazole Cream 2% Discharge Instruction: Apply Ketoconazole to dorsal foot Primary Dressing Hydrofera Blue Classic Foam, 4x4 in Discharge Instruction: Moisten with saline prior to applying to wound bed Secondary Dressing Woven Gauze Sponges 2x2 in Discharge Instruction: Apply over primary dressing as directed. Zetuvit Plus 4x8 in Discharge Instruction: Apply over primary dressing as directed. Secured With 36M Medipore H Soft Cloth Surgical T ape, 4 x 10 (in/yd) Discharge Instruction: Secure with tape as directed. Compression Wrap Compression Stockings Add-Ons Electronic Signature(s) Signed: 03/22/2022 5:24:26 PM By: Baruch Gouty RN, BSN Entered By: Baruch Gouty on 03/22/2022 11:02:50 -------------------------------------------------------------------------------- Vitals Details Patient Name: Date of Service: [name] 03/22/2022 10:30 A M Medical Record Number: [id number] Patient Account Number: [id number] Date of Birth/Sex: Treating RN: Nov 12, 1950 (71 y.o. [name] Primary Care Tomasita Beevers: [name] Other Clinician: Referring Tyreona Panjwani: Treating Iva Montelongo/Extender: [name] in Treatment: 36 Vital Signs Time Taken: 10:43 Temperature (F): 98.1 Height (in): 74 Pulse (bpm): 58 Weight (lbs): 238 Respiratory Rate (breaths/min): 18 Body Mass Index (BMI): 30.6 Blood Pressure (mmHg): 162/86 Capillary Blood Glucose (mg/dl): 98 Reference Range: 80 - 120 mg / dl Notes glucose per pt's meter at present Electronic Signature(s) Signed: 03/22/2022 5:24:26 PM By: Baruch Gouty RN, BSN Entered By: Baruch Gouty on 03/22/2022 10:44:02

## 2022-03-22 NOTE — Progress Notes (Signed)
[name], [name] ([id number]) Visit Report for 03/22/2022 Chief Complaint Document Details Patient Name: Date of Service: [name], [name] 03/22/2022 10:30 A M Medical Record Number: [id number] Patient Account Number: [id number] Date of Birth/Sex: Treating RN: 07-18-1950 (71 y.o. M) Primary Care Provider: [name] Other Clinician: Referring Provider: Treating Provider/Extender: [name] in Treatment: 49 Information Obtained from: Patient Chief Complaint Left leg and foot ulcers 04/12/2021; patient is here for wounds on his left lower leg and left plantar foot over the first metatarsal head Electronic Signature(s) Signed: 03/22/2022 11:15:28 AM By: Fredirick Maudlin MD FACS Entered By: Fredirick Maudlin on 03/22/2022 11:15:27 -------------------------------------------------------------------------------- Debridement Details Patient Name: Date of Service: [name] 03/22/2022 10:30 A M Medical Record Number: [id number] Patient Account Number: [id number] Date of Birth/Sex: Treating RN: Apr 13, 1951 (71 y.o. [name] Primary Care Provider: [name] Other Clinician: Referring Provider: Treating Provider/Extender: [name] in Treatment: 49 Debridement Performed for Assessment: Wound #22 Left,Lateral Lower Leg Performed By: Physician Fredirick Maudlin, MD Debridement Type: Debridement Level of Consciousness (Pre-procedure): Awake and Alert Pre-procedure Verification/Time Out Yes - 11:10 Taken: Start Time: 11:10 T Area Debrided (L x W): otal 2 (cm) x 1.4 (cm) = 2.8 (cm) Tissue and other material debrided: Non-Viable, Eschar, Slough, Slough Level: Non-Viable Tissue Debridement Description: Selective/Open Wound Instrument: Curette Bleeding: Minimum Hemostasis Achieved: Pressure Procedural Pain: 0 Post Procedural Pain: 0 Response to Treatment: Procedure was tolerated well Level of Consciousness (Post- Awake and  Alert procedure): Post Debridement Measurements of Total Wound Length: (cm) 2 Width: (cm) 1.4 Depth: (cm) 0.1 Volume: (cm) 0.22 Character of Wound/Ulcer Post Debridement: Improved Post Procedure Diagnosis Same as Pre-procedure Notes scribed by Baruch Gouty, RN for Dr. Celine Ahr Electronic Signature(s) Signed: 03/22/2022 12:21:30 PM By: Fredirick Maudlin MD FACS Signed: 03/22/2022 5:24:26 PM By: Baruch Gouty RN, BSN Entered By: Baruch Gouty on 03/22/2022 11:13:10 -------------------------------------------------------------------------------- HPI Details Patient Name: Date of Service: [name] 03/22/2022 10:30 A M Medical Record Number: [id number] Patient Account Number: [id number] Date of Birth/Sex: Treating RN: 1950-11-08 (71 y.o. M) Primary Care Provider: [name] Other Clinician: Referring Provider: Treating Provider/Extender: [name] in Treatment: 18 History of Present Illness HPI Description: 10/11/17; Mr. Methot is a 71 year old man who tells me that in 2015 he slipped down the latter traumatizing his left leg. He developed a wound in the same spot the area that we are currently looking at. He states this closed over for the most part although he always felt it was somewhat unstable. In 2016 he hit the same area with the door of his car had this reopened. He tells me that this is never really closed although sometimes an inflow it remains open on a constant basis. He has not been using any specific dressing to this except for topical antibiotics the nature of which were not really sure. His primary doctor did send him to see Dr. [name] of interventional cardiology. He underwent an angiogram on 08/06/17 and he underwent a PTA and directional atherectomy of the lesser distal SFA and popliteal arteries which resulted in brisk improvement in blood flow. It was noted that he had 2 vessel runoff through the anterior tibial and peroneal. He is  also been to see vascular and interventional radiologist. He was not felt to have any significant superficial venous insufficiency. Presumably is not a candidate for any ablation. It was suggested he come here for wound care. The patient is a type II  diabetic on insulin. He also has a history of venous insufficiency. ABIs on the left were noncompressible in our clinic 10/21/17; patient we admitted to the clinic last week. 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 We put Iodosorb on him after an aggressive debridement and 3 layer compression. He complained of pain in his ankle and itching with is skin in fact he scratched the area on the medial calf superiorly at the rim of our wraps and he has 2 small open areas in that location today which are new. I changed his primary dressing today to silver collagen. As noted he is already had revascularization and does not have any significant superficial venous insufficiency that would be amenable to ablation 10/28/17; patient admitted to the clinic 2 weeks ago. He has a smaller Wound. Scratch injury from last week revealed. There is large wound over the tibial area. This is smaller. Granulation looks healthy. No need for debridement. 11/04/17; the wound on the left lateral calf looks better. Improved dimensions. Surface of this looks better. We've been maintaining him and Kerlix Coban wraps. He finds this much more comfortable. Silver collagen dressing 11/11/17; left lateral Wound continues to look healthy be making progress. Using a #5 curet I removed removed nonviable skin from the surface of the wound and then necrotic debris from the wound surface. Surface of the wound continues to look healthy. He also has an open area on the left great toenail bed. We've been using topical antibiotics. 11/19/17; left anterior lateral wound continues to look healthy but it's not closed. He also had a small  wound above this on the left leg Initially traumatic wounds in the setting of significant chronic venous insufficiency and stasis dermatitis 11/25/17; left anterior wounds superiorly is closed still a small wound inferiorly. 12/02/17; left anterior tibial area. Arrives today with adherent callus. Post debridement clearly not completely closed. Hydrofera Blue under 3 layer compression. 12/09/17; left anterior tibia. Circumferential eschar however the wound bed looks stable to improved. We've been using Hydrofera Blue under 3 layer compression 12/17/17; left anterior tibia. Apparently this was felt to be closed however when the wrap was taken off there is a skin tear to reopen wounds in the same area we've been using Hydrofera Blue under 3 layer compression 12/23/17 left anterior tibia. Not close to close this week apparently the [facility] was stuck to this again. Still circumferential eschar requiring debridement. I put a contact layer on this this time under the Hydrofera Blue 12/31/17; left anterior tibia. Wound is better slight amount of hyper-granulation. Using Hydrofera Blue over Adaptic. 01/07/18; left anterior tibia. The wound had some surface eschar however after this was removed he has no open wound.he was already revascularized by Dr. [name] when he came to our clinic with atherectomy of the left SFA and popliteal artery. He was also sent to interventional radiology for venous reflux studies. He was not felt to have significant reflux but certainly has chronic venous changes of his skin with hemosiderin deposition around this area. He will definitely need to lubricate his skin and wear compression stocking and I've talked to him about this. READMISSION 05/26/2018 This is a now 71 year old man we cared for with traumatic wounds on his left anterior lower extremity. He had been previously revascularized during that admission by Dr. [name]. Apparently in follow-up Dr. [name] noted that he had  deterioration in his arterial status. He underwent a stent placement  in the distal left SFA on 04/22/2018. 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 He went back to the angiography suite on 04/30/2018 he underwent PTA and balloon angioplasty of the occluded left mid anterior tibial artery, thrombotic occlusion went from 100 to 0% which reconstitutes the posterior tibial artery. He had thrombectomy and aspiration of the peroneal artery. The stent placed in the distal SFA left SFA was still occluded. 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 He has been recuperating at [facility] in [facility] after the most recent hospitalization. He is going home tomorrow. He tells me that 2 weeks ago he traumatized the tip of his left fifth toe. He came in urgently for our review of this. This was a history of before I noted that Dr. [name] had already noted dry gangrenous changes of the left fifth toe 06/09/2018; 2-week follow-up. I did contact Dr. [name] after his last appointment and he apparently saw 1 of Dr. Irven Shelling colleagues the next day. He does not follow-up with Dr. [name] himself until Thursday of this week. 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 Nevertheless there is no evidence of infection no drainage and no pain. He had a new area that this week when we were signing him in today on the left anterior mid tibia area, this is in close proximity to the previous wound we have dealt with in this clinic. 06/23/2018; 2-week follow-up. I did not receive a recent note from Dr. [name] to review today. Our office is trying to obtain this. He is apparently not planning to do further vascular interventions and wondered about  compression to try and help with the patient's chronic venous insufficiency. However we are also concerned about the arterial flow. He arrives in clinic today with a new area on the left third toe. The areas on the calf/anterior tibia are close to closing. The left fifth toe is still mummified using Betadine. -In reviewing things with the patient he has what sounds like claudication with mild to moderate amount of activity. 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 I prescribed Levaquin over the phone while we attempted to arrange a plan of care. However the patient called yesterday to report he had low-grade fever and he came in today acutely. There is been a marked deterioration in the left third toe with spreading cellulitis up into the dorsal left foot. He was referred to the emergency room. Readmission: 06/29/2020 patient presents today for reevaluation here in our clinic he was previously treated by Dr. Dellia Nims at the latter part of 2019 in 2 the beginning of 2020. Subsequently we have not seen him since that time in the interim he did have evaluation with vein and vascular specialist specifically Dr. Anice Paganini who did perform quite extensive work for a left femoral to anterior tibial artery bypass. With that being said in the interim the patient has developed significant lymphedema and has wounds that he tells me have really never healed in regard to the incision site on the left leg. He also has multiple wounds on the feet for various reasons some of which is that he tends to pick at his feet. Fortunately  there is no signs of active infection systemically at this time he does have some wounds that are little bit deeper but most are fairly superficial he seems to have good blood flow and overall everything appears to be healthy I see no bone exposed and no obvious signs of osteomyelitis. I do not know that he necessarily needs a x-ray at this point although that  something we could consider depending on how things progress. The patient does have a history of lymphedema, diabetes, this is type II, chronic kidney disease stage III, hypertension, and history of peripheral vascular disease. 07/05/2020; patient admitted last week. Is a patient I remember from 2019 he had a spreading infection involving the left foot and we sent him to the hospital. 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 He subsequently had a left femoral to anterior tibial bypass by Dr.Cain vein and vascular. He also has severe lymphedema with chronic skin changes related to that on the left leg. The most problematic area that was new today was on the left medial great toe. This was apparently a small area last week there was purulent drainage which our intake nurse cultured. Also areas on the left medial foot and heel left lateral foot. He has 2 areas on the left medial calf left lateral calf in the setting of the severe lymphedema. 07/13/2020 on evaluation today patient appears to be doing better in my opinion compared to his last visit. The good news is there is no signs of active infection systemically and locally I do not see any signs of infection either. He did have an x-ray which was negative that is great news he had a culture which showed MRSA but at the same time he is been on the doxycycline which has helped. I do think we may want to extend this for 7 additional days 1/25; patient admitted to the clinic a few weeks ago. 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 We have been putting him under compression his edema control is a lot better but he is severe verricused skin on the left leg. He is really done quite well he still has an open area on the left medial calf and the left medial first metatarsal head. We have been using silver collagen on the leg silver alginate on the foot 07/27/2020 upon  evaluation today patient appears to be doing decently well in regard to his wounds. He still has a lot of dry skin on the left leg. Some of this is starting to peel back and I think he may be able to have them out by removing some that today. Fortunately there is no signs of active infection at this time on the left leg although on the right leg he does appear to have swelling and erythema as well as some mild warmth to touch. This does have been concerned about the possibility of cellulitis although within the differential diagnosis I do think that potentially a DVT has to be at least considered. We need to rule that out before proceeding would just call in the cellulitis. Especially since he is having pain in the posterior aspect of his calf muscle. 2/8; the patient had seen sparingly. He has severe skin changes of chronic lymphedema in the left leg thickened hyperkeratotic verrucous skin. He has an open wound on the medial part of the left first met head left mid tibia. He also has  a rim of nonepithelialized skin in the anterior mid tibia. He brought in the AmLactin lotion that was been prescribed although I am not sure under compression and its utility. There concern about cellulitis on the right lower leg the last time he was here. He was put on on antibiotics. His DVT rule out was negative. The right leg looks fine he is using his stocking on this area 08/10/2020 upon evaluation today patient appears to be doing well with regard to his leg currently. He has been tolerating the dressing changes without complication. Fortunately there is no signs of active infection which is great news. Overall very pleased with where things stand. 2/22; the patient still has an area on the medial part of the left first met his head. This looks better than when I last saw this earlier this month he has a rim of epithelialization but still some surface debris. Mostly everything on the left leg is healed. There is  still a vulnerable in the left mid tibia area. 08/30/2020 upon evaluation today patient appears to be doing much better in regard to his wounds on his foot. 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 Nonetheless I think we will address that today I Minna send in a prescription for him in that regard. Overall though there does not appear to be any signs of significant worsening. 09/07/2020 on evaluation today patient's wounds over his left foot appear to be doing excellent. I do not see any signs of infection there is some callus buildup this can require debridement for certain but overall I feel like he is managing quite nicely. He still using the AmLactin cream which has been beneficial for him as well. 3/22; left foot wound is closed. There is no open area here. He is using ammonium lactate lotion to the lower extremities to help exfoliate dry cracked skin. He has compression stockings from elastic therapy in [place]. The wound on the medial part of his left first met head is healed today. READMISSION 04/12/2021 Mr. Croucher is a patient we know fairly well he had a prolonged stay in clinic in 2019 with wounds on his left lateral and left anterior lower extremity in the setting of chronic venous insufficiency. More recently he was here earlier this year with predominantly an area on his left foot first metatarsal head plantar and he says the plantar foot broke down on its not long after we discharged him but he did not come back here. The last few months areas of broken down on his left anterior and again the left lateral lower extremity. The leg itself is very swollen chronically enlarged a lot of hyperkeratotic dry Berry Q skin in the left lower leg. His edema extends well into the thigh. He was seen by Dr. Donzetta Matters. He had ABIs on 03/02/2021 showing an ABI on the right  of 1 with a TBI of 0.72 his ABI in the left at 1.09 TBI of 0.99. Monophasic and biphasic waveforms on the right. On the left monophasic waveforms were noted he went on to have an angiogram on 03/27/2021 this showed the aortic aortic and iliac segments were free of flow-limiting stenosis the left common femoral vein to evaluate the left femoral to anterior tibial artery bypass was unobstructed the bypass was patent without any areas of stenosis. We discharged the patient in bilateral juxta lite  stockings but very clearly that was not sufficient to control the swelling and maintain skin integrity. He is clearly going to need compression pumps. 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 This is fortunate because he is on his feet for long periods of time. 10/27; patient comes in with our intake nurse reporting copious amount of green drainage from the left anterior mid tibia the left dorsal foot and to a lesser extent the left medial mid tibia. We left the compression wrap on all week for the amount of edema in his left leg is quite a bit better. We use silver alginate as the primary dressing 11/3; edema control is good. Left anterior lower leg left medial lower leg and the plantar first metatarsal head. The left anterior lower leg required debridement. Deep tissue culture I did of this wound showed MRSA I put him on 10 days of doxycycline which she will start today. We have him in compression wraps. He has a security card and [employer] however he is retiring on November 15. We will need to then get him into a better offloading boot for the left foot perhaps a total contact cast 11/10; edema control is quite good. Left anterior and left medial lower leg wounds in the setting of chronic venous insufficiency and lymphedema. He also has a substantial area over the left plantar first metatarsal head. I treated him for MRSA that we identified on the major wound  on the left anterior mid tibia with doxycycline and gentamicin topically. He has significant hypergranulation on the left plantar foot wound. The patient is a diabetic but he does not have significant PAD 11/17; edema control is quite good. Left anterior and left medial lower leg wounds look better. The really concerning area remains the area on the left plantar first metatarsal head. He has a rim of epithelialization. 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 Starting today with a forefoot off loader but . possibly a total contact cast. 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 Arrives with a very significant odor from I think all of the wounds on the left leg and the left foot. Because of this I did not put a total contact cast on him today but will could still consider this. His wife was having cataract surgery which is the reason he missed the appointment 12/6. I saw this man 5 days ago with a swelling below the popliteal fossa. I thought he actually might have a Baker's cyst however the DVT rule out study that we could arrange right away was negative the technician told me this was not a ruptured Baker's cyst. We attempted to get this aspirated by under ultrasound guidance in interventional radiology however all they did was an ultrasound however it shows an extensive fluid collection 62 x 8 x 9.4 in the left thigh and left calf. The patient states he thinks this started 8 days ago or so but he really is not complaining of any pain, fever or systemic symptoms. He has not ha 12/20; after some difficulty I managed to get the patient into see Dr. Donzetta Matters. Eventually he was taken into the hospital  and had a drain put in the fluid collection below his left knee posteriorly extending  into the posterior thigh. He still has the drain in place. Culture of this showed moderate staff aureus few Morganella and few Klebsiella he is now on doxycycline and ciprofloxacin as suggested by infectious disease he is on this for a month. The drain will remain in place until it stops draining 12/29; he comes in today with the 1 wound on his left leg and the area on the left plantar first met head significantly smaller. Both look healthy. He still has the drain in the left leg. He says he has to change this daily. Follows up with Dr. Donzetta Matters on January 11. 06/29/2021; the wounds that I am following on the left leg and left first met head continued to be quite healthy. However the area where his inferior drain is in place had copious amounts of drainage which was green in color. The wound here is larger. Follows up with Dr. Gwenlyn Saran of vein and vascular his surgeon next week as well as infectious disease. He remains on ciprofloxacin and doxycycline. He is not complaining of excessive pain in either one of the drain areas 1/12; the patient saw vascular surgery and infectious disease. Vascular surgery has left the drain in place as there was still some notable drainage still see him back in 2 weeks. Dr. Velna Ochs stop the doxycycline and ciprofloxacin and I do not believe he follows up with them at this point. Culture I did last week showed both doxycycline resistant MRSA and Pseudomonas not sensitive to ciprofloxacin although only in rare titers 1/19; the patient's wound on the left anterior lower leg is just about healed. We have continued healing of the area that was medially on the left leg. Left first plantar metatarsal head continues to get smaller. The major problem here is his 2 drain sites 1 on the left upper calf and lateral thigh. There is purulent drainage still from the left lateral thigh. I gave him antibiotics last week but we still have recultured. He has the drain in the area I think this is  eventually going to have to come out. I suspect there will be a connecting wound to heal here perhaps with improved VAc 1/26; the patient had his drain removed by vein and vascular on 1/25/. This was a large pocket of fluid in his left thigh that seem to tunnel into his left upper calf. 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 His mention his Penrose drain was removed today. He now has a tunneling wound on his left calf and left thigh. Both of these probe widely towards each other although I cannot really prove that they connect. Both wounds on his lower leg anteriorly are closed and his area over the first metatarsal head on his right foot continues to improve. We are using Hydrofera Blue here. He also saw infectious disease culture of the abscess they noted was polymicrobial with MRSA, Morganella and Klebsiella he was treated with doxycycline and ciprofloxacin for 4 weeks ending on 07/03/2021. They did not recommend any further antibiotics. Notable that while he still had the Penrose drain in place last week he had purulent drainage coming out of the inferior IandD site this grew [facility] [place] [facility], MRSA and Pseudomonas but there does not appear to be any active infection in this area today with the drain out and he is not systemically unwell 2/2; with regards to the drain sites the superior  one on the thigh actually is closed down the one on the upper left lateral calf measures about 8 and half centimeters which is an improvement seems to be less prominent although still with a lot of drainage. The only remaining wound is over the first metatarsal head on the left foot and this looks to be continuing to improve with Hydrofera Blue. 2/9; the area on his plantar left foot continues to contract. Callus around the wound edge. The drain sites specifically have not come down in depth. We put the wound VAC on Monday he changed the canister late last night our intake nurse reported a pocket of  fluid perhaps caused by our compression wraps 2/16; continued improvement in left foot plantar wound. drainage site in the calf is not improved in terms of depth (wound vac) 2/23; continued improvement in the left foot wound over the first metatarsal head. With regards to the drain sites the area on his thigh laterally is healed however the open area on his calf is small in terms of circumference by still probes in by about 15 cm. Within using the wound VAC. Hydrofera Blue on his foot 08/24/2021: The left first metatarsal head wound continues to improve. The wound bed is healthy with just some surrounding callus. Unfortunately the open drain site on his calf remains open and tunnels at least 15 cm (the extent of a Q-tip). This is despite several weeks of wound VAC treatment. Based on reading back through the notes, there has been really no significant change in the depth of the wound, although the orifice is smaller and the more cranial wound on his thigh has closed. I suspect the tunnel tracks nearly all the way to this location. 08/31/2021: Continued improvement in the left first metatarsal head wound. There has been absolutely no improvement to the long tunnel from his open drain site on his calf. We have tried to get him into see vascular surgery sooner to consider the possibility of simply filleting the tract open and allowing it to heal from the bottom up, likely with a wound VAC. They have not yet scheduled a sooner appointment than his current mid April 09/14/2021: He was seen by vascular surgery and they took him to the operating room last week. They opened a portion of the tunnel, but did not extend the entire length of the known open subcutaneous tract. I read Dr. Claretha Cooper operative note and it is not clear from that documentation why only a portion of the tract was opened. The heaped up granulation tissue was curetted and removed from at least some portion of the tract. They did place a wound VAC  and applied an Unna boot to the leg. The ulcer on his left first metatarsal head is smaller today. The bed looks good and there is just a small amount of surrounding callus. 09/21/2021: The ulcer on his left first metatarsal head looks to be stalled. There is some callus surrounding the wound but the wound bed itself does not appear particularly dynamic. The tunnel tract on his lateral left leg seems to be roughly the same length or perhaps slightly smaller but the wound bed appears healthy with good granulation tissue. He opened up a new wound on his medial thigh and the site of a prior surgical incision. He says that he did this unconsciously in his sleep by scratching. 09/28/2021: Unfortunately, the ulcer on his left first metatarsal head has extended underneath the callus toward the dorsum of his foot. The medial  thigh wounds are roughly the same. The tunnel on his lateral left leg continues to be problematic; it is longer than we are able to actually probe with a Q-tip. I am still not certain as to why Dr. Donzetta Matters did not open this up entirely when he took the patient to the operating room. We will likely be back in the same situation with just a small superficial opening in a long unhealed tract, as the open portion is granulating in nicely. 10/02/2021: The patient was initially scheduled for a nurse visit, but we are also applying a total contact cast today. The plantar foot wound looks clean without significant accumulated callus. We have been applying Prisma silver collagen to the site. 10/05/2021: The patient is here for his first total contact cast change. We have tried using gauze packing strips in the tunnel on his lateral leg wound, but this does not seem to be working any better than the white VAC foam. The foot ulcer looks about the same with minimal periwound callus. Medial thigh wound is clean with just some overlying eschar. 10/12/2021: The plantar foot wound is stable without any  significant accumulation of periwound callus. The surface is viable with good granulation tissue. The medial thigh wounds are much smaller and are epithelializing. On the other hand, he had purulent drainage coming from the tunnel on his lateral leg. He does go back to see Dr. Donzetta Matters next week and is planning to ask him why the wound tunnel was not completely opened at the time of his most recent operation. 10/19/2021: The plantar foot wound is markedly improved and has epithelial tissue coming through the surface. The medial thigh wounds are nearly closed with just a tiny open area. He did see Dr. Donzetta Matters earlier this week and apparently they did discuss the possibility of opening the sinus tract further and enabling a wound VAC application. Apparently there are some limits as to what Dr. 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 I think if we could get the tract open to the level of the popliteal fossa, this would greatly aid in her ability to get this chart closed. That being said, however, today when I probed the tract with a Q-tip, I was not able to insert the entirety of the Q-tip as I have on previous occasions. The tunnel is shorter by about 4 cm. The surface is clean with good granulation tissue and no further episodes of purulent drainage. 10/30/2021: Last week, the patient underwent surgery and had the long tract in his leg opened. There was a rind that was debrided, according to the operative report. His medial thigh ulcers are closed. The plantar foot wound is clean with a good surface and some built up surrounding callus. 11/06/2021: The overall dimensions of the large wound on his lateral leg remain about the same, but there is good granulation tissue present and the tunneling is a little bit shorter. He has a new wound on his anterior tibial surface, in the same location where he had a similar lesion in the past. The plantar foot wound is clean with some  buildup surrounding callus. Just toward the medial aspect of his foot, however, there is an area of darkening that once debrided, revealed another opening in the skin surface. 11/13/2021: The anterior tibial surface wound is closed. The plantar foot wound has some surrounding callus buildup. The area of darkening that I debrided last week and revealed an opening in the skin surface has  closed again. The tunnel in the large wound on his lateral leg has come in by about 3 cm. There is healthy granulation tissue on the entire wound surface. 11/23/2021: The patient was out of town last week and did wet-to-dry dressings on his large wound. 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 Unfortunately, he picked open the wound on his left medial thigh. He says that it was itching and he just could not stop scratching it until it was open again. The wound on his plantar foot is smaller and has not accumulated a tremendous amount of callus. The lateral leg wound is shallower and the tunnel has also decreased in depth. There is just a little bit of slough accumulation on the surface. 11/30/2021: Another portion of his left medial thigh has been opened up. All of these wounds are fairly superficial with just a little bit of slough and eschar accumulation. The wound on his plantar foot is almost closed with just a bit of eschar and periwound callus accumulation. The lateral leg wound is nearly flush with the surrounding skin and the tunnel is markedly shallower. 12/07/2021: There is just 1 open area on his left medial thigh. It is clean with just a little bit of perimeter eschar. The wound on his plantar foot continues to contract and just has some eschar and periwound callus accumulation. The lateral leg wound is closing at the more distal aspect and the tunnel is smaller. The surface is nearly flush with the surrounding skin and it has a good bed of  granulation tissue. 12/14/2021: The thigh and foot wounds are closed. The lateral leg wound has closed over approximately half of its length. The tunnel continues to contract and the surface is now flush with the surrounding skin. The wound bed has robust granulation tissue. 12/22/2021: The thigh and foot wounds have reopened. The foot wound has a lot of callus accumulation around and over it. The thigh wound is tiny with just a little bit of slough in the wound bed. The lateral leg wound continues to contract. His vascular surgeon took the wound VAC off earlier in the week and the patient has been doing wet-to-dry dressings. There is a little slough accumulation on the surface. The tunnel is about 3 cm in depth at this point. 12/28/2021: The thigh wound is closed again. The foot wound has some callus that subsequently has peeled back exposing just a small slit of a wound. The lateral leg wound Is down to about half the size that it originally was and the tunnel is down to about half a centimeter in depth. 01/04/2022: The thigh wound remains closed. The foot wound has heavy callus overlying the wound site. Once this was debrided, the wound was found to be closed. The lateral leg wound is smaller again this week and very superficial. No tunnel could be identified. 01/12/2022: The thigh and foot wounds both remain closed. The lateral leg wound is now nearly flush with the skin surface. There is good granulation tissue present with a light layer of slough. 01/19/2022: Due to the way his wrap was placed, the patient did not change the dressing on his thigh at all and so the foam was saturated and his skin is macerated. There is a light layer of slough on the wound surface. The underlying granulation tissue is robust and healthy-appearing. He has heavy callus buildup at the site of his first metatarsal head wound  which is still healed. 02/01/2022: He has been in silver alginate. When he removed the dressing from  his thigh wound, however, some leg, superficially reopening a portion of the wound that had healed. In addition, underneath the callus at his left first metatarsal head, there appears to be a blister and the wound appears to be open again. 02/08/2022: The lateral leg wound has contracted substantially. There is eschar and a light layer of slough present. He says that it is starting to pull and is uncomfortable. On inspection, there is some puckering of the scar and the eschar is quite dry; this may account for his symptoms. On his first metatarsal head, the wound is much smaller with just some eschar on the surface. The callus has not reaccumulated. He reports that he had a blister come up on his medial thigh wound at the distal aspect. It popped and there is now an opening in his skin again. Looking back through his [place] of wound photos, there is what looks like a permanent suture just deep to this location and it may be trying to erode through. We have been using silver alginate on his wounds. 02/15/2022: The lateral leg wound is about half the size it was last week. It is clean with just a little perimeter eschar and light slough. The wound on his first metatarsal head is about the same with heavy callus overlying it. The medial thigh wound is closed again. He does have some skin changes on the top of his foot that looks potentially yeast related. 02/22/2022: The skin on the top of his foot improved with the use of a topical antifungal. The lateral leg wound continues to contract and is again smaller this week. There is a little bit of slough and eschar on the surface. The first metatarsal head wound is a little bit smaller but has reaccumulated a thick callus over the top. He decided to try to trim his toenail and ultimately took the entire nail off of his left great toe. 03/02/2022: His lateral leg wound continues to improve, as does the wound on his left great toe. Unfortunately, it appears that  somehow his foot got wet and moisture seeped in through the opening causing his skin to lift. There is a large wound now overlying his first metatarsal on both the plantar, medial, and dorsal portion of his foot. There is necrotic tissue and slough present underneath the shaggy macerated skin. 03/08/2022: The lateral leg wound is smaller again today. There is just a light layer of slough and eschar on the surface. The great toe wound is smaller again today. The first metatarsal wound is a little bit smaller today and does not look nearly as necrotic and macerated. There is still slough and nonviable tissue present. 03/15/2022: The lateral leg wound is narrower and just has a little bit of light slough buildup. The first metatarsal wound still has a fair amount of moisture affecting the periwound skin. The great toe wound is healed. 03/22/2022: The lateral leg wound is now isolated to just at the level of his knee. There is some eschar and slough accumulation. The first metatarsal head wound has epithelialized tremendously and is about half the size that it was last week. He still has some maceration on the top of his foot and a fungal odor is present. Electronic Signature(s) Signed: 03/22/2022 11:16:19 AM By: Fredirick Maudlin MD FACS Entered By: Fredirick Maudlin on 03/22/2022 11:16:19 -------------------------------------------------------------------------------- Physical Exam Details Patient Name: Date of Service: [place],  [place] [facility] W. 03/22/2022 10:30 A M Medical Record Number: [id number] Patient Account Number: [id number] Date of Birth/Sex: Treating RN: [birth year] (71 y.o. M) Primary Care Provider: [name] Other Clinician: Referring Provider: Treating Provider/Extender: [name] in Treatment: 81 Constitutional Hypertensive, asymptomatic. Slightly bradycardic, asymptomatic.. . . No acute distress.Marland Kitchen Respiratory Normal work of breathing on room  air.. Notes 03/22/2022: The lateral leg wound is now isolated to just at the level of his knee. There is some eschar and slough accumulation. The first metatarsal head wound has epithelialized tremendously and is about half the size that it was last week. He still has some maceration on the top of his foot and a fungal odor is present. Electronic Signature(s) Signed: 03/22/2022 11:16:53 AM By: Fredirick Maudlin MD FACS Entered By: Fredirick Maudlin on 03/22/2022 11:16:53 -------------------------------------------------------------------------------- Physician Orders Details Patient Name: Date of Service: [name] 03/22/2022 10:30 A M Medical Record Number: [id number] Patient Account Number: [id number] Date of Birth/Sex: Treating RN: 04/10/1951 (71 y.o. [name] Primary Care Provider: [name] Other Clinician: Referring Provider: Treating Provider/Extender: [name] in Treatment: 59 Verbal / Phone Orders: No Diagnosis Coding ICD-10 Coding Code Description 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 I89.0 Lymphedema, not elsewhere classified I87.322 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verity 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 of other part of left foot with other specified severity Follow-up Appointments ppointment in 1 week. - Dr Celine Ahr - Room 1 Return A Thursday 10/5 @ 10: 30 am Anesthetic Wound #22 Left,Lateral Lower Leg (In clinic) Topical Lidocaine 4% applied to wound bed Wound #27 Left,Medial Foot (In clinic) Topical Lidocaine 4% applied to wound bed Bathing/ Shower/ Hygiene May shower with protection but do not get wound dressing(s) wet. - Use a cast protector so you can shower without getting your wrap(s) wet Edema Control - Lymphedema / SCD /  Other Elevate legs to the level of the heart or above for 30 minutes daily and/or when sitting, a frequency of: - throughout the day Avoid standing for long periods of time. Patient to wear own compression stockings every day. - on right leg; Moisturize legs daily. - Ammonium lactate to right leg daily Off-Loading Total Contact Cast to Left Lower Extremity Wound Treatment Wound #22 - Lower Leg Wound Laterality: Left, Lateral Cleanser: Soap and Water 3 x Per Week/30 Days Discharge Instructions: May shower and wash wound with dial antibacterial soap and water prior to dressing change. Cleanser: Wound Cleanser 3 x Per Week/30 Days Discharge Instructions: Cleanse the wound with wound cleanser prior to applying a clean dressing using gauze sponges, not tissue or cotton balls. Peri-Wound Care: Sween Lotion (Moisturizing lotion) 3 x Per Week/30 Days Discharge Instructions: Apply moisturizing lotion to the leg Prim Dressing: KerraCel Ag Gelling Fiber Dressing, 4x5 in (silver alginate) 3 x Per Week/30 Days ary Discharge Instructions: Apply silver alginate to wound bed as instructed Secondary Dressing: ABD Pad, 8x10 (Generic) 3 x Per Week/30 Days Discharge Instructions: Apply over primary dressing as directed. Secured With: Elastic Bandage 4 inch (ACE bandage) 3 x Per Week/30 Days Discharge Instructions: Secure with ACE bandage as directed. Wound #27 - Foot Wound Laterality: Left, Medial Peri-Wound Care: Sween Lotion (Moisturizing lotion) 1 x Per Week Discharge Instructions: Apply moisturizing lotion as directed Topical: Ketoconazole Cream 2% 1 x Per Week Discharge Instructions: Apply Ketoconazole to  dorsal foot Prim Dressing: Hydrofera Blue Classic Foam, 4x4 in 1 x Per Week ary Discharge Instructions: Moisten with saline prior to applying to wound bed Secondary Dressing: Woven Gauze Sponges 2x2 in 1 x Per Week Discharge Instructions: Apply over primary dressing as directed. Secondary Dressing:  Zetuvit Plus 4x8 in 1 x Per Week Discharge Instructions: Apply over primary dressing as directed. Secured With: 7M Medipore H Soft Cloth Surgical T ape, 4 x 10 (in/yd) 1 x Per Week Discharge Instructions: Secure with tape as directed. Electronic Signature(s) Signed: 03/22/2022 12:21:30 PM By: Fredirick Maudlin MD FACS Entered By: Fredirick Maudlin on 03/22/2022 11:17:08 -------------------------------------------------------------------------------- Problem List Details Patient Name: Date of Service: [name] 03/22/2022 10:30 A M Medical Record Number: [id number] Patient Account Number: [id number] Date of Birth/Sex: Treating RN: 02/02/1951 (71 y.o. [name] Primary Care Provider: Other Clinician: [name] Referring Provider: Treating Provider/Extender: [name] in Treatment: 37 Active Problems ICD-10 Encounter Code Description Active Date MDM Diagnosis 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 04/12/2021 No Yes I89.0 Lymphedema, not elsewhere classified 04/12/2021 No Yes I87.322 Chronic venous hypertension (idiopathic) with inflammation of left lower 04/12/2021 No Yes extremity L97.828 Non-pressure chronic ulcer of other part of left lower leg with other specified 04/12/2021 No Yes severity 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 of other part of left foot with other specified 04/12/2021 No Yes severity Inactive Problems ICD-10 Code Description Active Date Inactive Date E11.621 Type 2 diabetes mellitus with foot ulcer 04/12/2021 04/12/2021 E11.42 Type 2 diabetes mellitus with diabetic polyneuropathy 04/12/2021 04/12/2021 L02.416 Cutaneous abscess of left lower limb 06/13/2021 06/13/2021 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 03/22/2022 10:30 A M Medical Record Number: [id number] Patient Account Number: [id number] Date of Birth/Sex: Treating RN: 08-06-1950 (71 y.o. M) Primary Care Provider: [name] Other Clinician: Referring Provider: Treating Provider/Extender: [name] in Treatment: 49 Subjective Chief Complaint Information obtained from Patient Left leg and foot ulcers 04/12/2021; patient is here for wounds on his left lower leg and left plantar foot over the first metatarsal head History of Present Illness (HPI) 10/11/17; Mr. Gulas is a 71 year old man who tells me that in 2015 he slipped down the latter traumatizing his left leg. He developed a wound in the same spot the area that we are currently looking at. He states this closed over for the most part although he always felt it was somewhat unstable. In 2016 he hit the same area with the door of his car had this reopened. He tells me that this is never really closed although sometimes an inflow it remains open on a constant basis. He has not been using any specific dressing to this except for topical antibiotics the nature of which were not really sure. His primary doctor did send him to see Dr. [name] of interventional cardiology. He underwent an angiogram on 08/06/17 and he underwent a PTA and directional atherectomy of the lesser distal SFA and popliteal arteries which resulted in brisk improvement in blood flow. It was noted that he had 2 vessel runoff through the anterior tibial and peroneal. He is also been to see vascular and interventional radiologist. He was not felt to have any significant superficial venous insufficiency. Presumably is not  a candidate for any ablation. It was suggested he come here for wound care. The  patient is a type II diabetic on insulin. He also has a history of venous insufficiency. ABIs on the left were noncompressible in our clinic 10/21/17; patient we admitted to the clinic last week. 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 We put Iodosorb on him after an aggressive debridement and 3 layer compression. He complained of pain in his ankle and itching with is skin in fact he scratched the area on the medial calf superiorly at the rim of our wraps and he has 2 small open areas in that location today which are new. I changed his primary dressing today to silver collagen. As noted he is already had revascularization and does not have any significant superficial venous insufficiency that would be amenable to ablation 10/28/17; patient admitted to the clinic 2 weeks ago. He has a smaller Wound. Scratch injury from last week revealed. There is large wound over the tibial area. This is smaller. Granulation looks healthy. No need for debridement. 11/04/17; the wound on the left lateral calf looks better. Improved dimensions. Surface of this looks better. We've been maintaining him and Kerlix Coban wraps. He finds this much more comfortable. Silver collagen dressing 11/11/17; left lateral Wound continues to look healthy be making progress. Using a #5 curet I removed removed nonviable skin from the surface of the wound and then necrotic debris from the wound surface. Surface of the wound continues to look healthy. ooHe also has an open area on the left great toenail bed. We've been using topical antibiotics. 11/19/17; left anterior lateral wound continues to look healthy but it's not closed. ooHe also had a small wound above this on the left leg ooInitially traumatic wounds in the setting of significant chronic venous insufficiency and stasis dermatitis 11/25/17; left anterior wounds superiorly is closed still a small wound  inferiorly. 12/02/17; left anterior tibial area. Arrives today with adherent callus. Post debridement clearly not completely closed. Hydrofera Blue under 3 layer compression. 12/09/17; left anterior tibia. Circumferential eschar however the wound bed looks stable to improved. We've been using Hydrofera Blue under 3 layer compression 12/17/17; left anterior tibia. Apparently this was felt to be closed however when the wrap was taken off there is a skin tear to reopen wounds in the same area we've been using Hydrofera Blue under 3 layer compression 12/23/17 left anterior tibia. Not close to close this week apparently the [facility] was stuck to this again. Still circumferential eschar requiring debridement. I put a contact layer on this this time under the Hydrofera Blue 12/31/17; left anterior tibia. Wound is better slight amount of hyper-granulation. Using Hydrofera Blue over Adaptic. 01/07/18; left anterior tibia. The wound had some surface eschar however after this was removed he has no open wound.he was already revascularized by Dr. [name] when he came to our clinic with atherectomy of the left SFA and popliteal artery. He was also sent to interventional radiology for venous reflux studies. He was not felt to have significant reflux but certainly has chronic venous changes of his skin with hemosiderin deposition around this area. He will definitely need to lubricate his skin and wear compression stocking and I've talked to him about this. READMISSION 05/26/2018 This is a now 71 year old man we cared for with traumatic wounds on his left anterior lower extremity. He had been previously revascularized during that admission by Dr.  Ganji. Apparently in follow-up Dr. [name] noted that he had deterioration in his arterial status. He underwent a stent placement in the distal left SFA on 04/22/2018. 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 He went back to the angiography suite on 04/30/2018 he  underwent PTA and balloon angioplasty of the occluded left mid anterior tibial artery, thrombotic occlusion went from 100 to 0% which reconstitutes the posterior tibial artery. He had thrombectomy and aspiration of the peroneal artery. The stent placed in the distal SFA left SFA was still occluded. 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 He has been recuperating at [place] home in [facility] after the most recent hospitalization. He is going home tomorrow. He tells me that 2 weeks ago he traumatized the tip of his left fifth toe. He came in urgently for our review of this. This was a history of before I noted that Dr. [name] had already noted dry gangrenous changes of the left fifth toe 06/09/2018; 2-week follow-up. I did contact Dr. [name] after his last appointment and he apparently saw 1 of Dr. Irven Shelling colleagues the next day. He does not follow-up with Dr. [name] himself until Thursday of this week. 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 Nevertheless there is no evidence of infection no drainage and no pain. He had a new area that this week when we were signing him in today on the left anterior mid tibia area, this is in close proximity to the previous wound we have dealt with in this clinic. 06/23/2018; 2-week follow-up. I did not receive a recent note from Dr. [name] to review today. Our office is trying to obtain this. He is apparently not planning to do further vascular interventions and wondered about compression to try and help with the patient's chronic venous insufficiency. However we are also concerned about the arterial flow. ooHe arrives in clinic today with a new area on the left third toe. The areas on the  calf/anterior tibia are close to closing. The left fifth toe is still mummified using Betadine. -In reviewing things with the patient he has what sounds like claudication with mild to moderate amount of activity. 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 I prescribed Levaquin over the phone while we attempted to arrange a plan of care. However the patient called yesterday to report he had low-grade fever and he came in today acutely. There is been a marked deterioration in the left third toe with spreading cellulitis up into the dorsal left foot. He was referred to the emergency room. Readmission: 06/29/2020 patient presents today for reevaluation here in our clinic he was previously treated by Dr. Dellia Nims at the latter part of 2019 in 2 the beginning of 2020. Subsequently we have not seen him since that time in the interim he did have evaluation with vein and vascular specialist specifically Dr. Anice Paganini who did perform quite extensive work for a left femoral to anterior tibial artery bypass. With that being said in the interim the patient has developed significant lymphedema and has wounds that he tells me have really never healed in regard to the incision site on the left leg. He also has  multiple wounds on the feet for various reasons some of which is that he tends to pick at his feet. Fortunately there is no signs of active infection systemically at this time he does have some wounds that are little bit deeper but most are fairly superficial he seems to have good blood flow and overall everything appears to be healthy I see no bone exposed and no obvious signs of osteomyelitis. I do not know that he necessarily needs a x-ray at this point although that something we could consider depending on how things progress. The patient does have a history of lymphedema, diabetes, this is type II, chronic kidney disease stage III, hypertension, and history of peripheral  vascular disease. 07/05/2020; patient admitted last week. Is a patient I remember from 2019 he had a spreading infection involving the left foot and we sent him to the hospital. 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 He subsequently had a left femoral to anterior tibial bypass by Dr.Cain vein and vascular. He also has severe lymphedema with chronic skin changes related to that on the left leg. The most problematic area that was new today was on the left medial great toe. This was apparently a small area last week there was purulent drainage which our intake nurse cultured. Also areas on the left medial foot and heel left lateral foot. He has 2 areas on the left medial calf left lateral calf in the setting of the severe lymphedema. 07/13/2020 on evaluation today patient appears to be doing better in my opinion compared to his last visit. The good news is there is no signs of active infection systemically and locally I do not see any signs of infection either. He did have an x-ray which was negative that is great news he had a culture which showed MRSA but at the same time he is been on the doxycycline which has helped. I do think we may want to extend this for 7 additional days 1/25; patient admitted to the clinic a few weeks ago. 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 We have been putting him under compression his edema control is a lot better but he is severe verricused skin on the left leg. He is really done quite well he still has an open area on the left medial calf and the left medial first metatarsal head. We have been using silver collagen on the leg silver alginate on the foot 07/27/2020 upon evaluation today patient appears to be doing decently well in regard to his wounds. He still has a lot of dry skin on the left leg. Some of this is starting to peel back and I think he may be able to have them out by  removing some that today. Fortunately there is no signs of active infection at this time on the left leg although on the right leg he does appear to have swelling and erythema as well as some mild warmth to touch. This does have been concerned about the possibility of cellulitis although within the differential diagnosis I do think that potentially a DVT has to be at least considered. We need to rule that out before proceeding would just call in the cellulitis. Especially since he is having pain in the posterior aspect of his calf muscle. 2/8; the patient had seen sparingly. He has severe skin changes of chronic lymphedema in the left leg thickened hyperkeratotic verrucous skin.  He has an open wound on the medial part of the left first met head left mid tibia. He also has a rim of nonepithelialized skin in the anterior mid tibia. He brought in the AmLactin lotion that was been prescribed although I am not sure under compression and its utility. There concern about cellulitis on the right lower leg the last time he was here. He was put on on antibiotics. His DVT rule out was negative. The right leg looks fine he is using his stocking on this area 08/10/2020 upon evaluation today patient appears to be doing well with regard to his leg currently. He has been tolerating the dressing changes without complication. Fortunately there is no signs of active infection which is great news. Overall very pleased with where things stand. 2/22; the patient still has an area on the medial part of the left first met his head. This looks better than when I last saw this earlier this month he has a rim of epithelialization but still some surface debris. Mostly everything on the left leg is healed. There is still a vulnerable in the left mid tibia area. 08/30/2020 upon evaluation today patient appears to be doing much better in regard to his wounds on his foot. Fortunately there does not appear to be any signs of active  infection systemically though locally we did culture this last week and it does appear that he does have MRSA currently. Nonetheless I think we will address that today I Minna send in a prescription for him in that regard. Overall though there does not appear to be any signs of significant worsening. 09/07/2020 on evaluation today patient's wounds over his left foot appear to be doing excellent. I do not see any signs of infection there is some callus buildup this can require debridement for certain but overall I feel like he is managing quite nicely. He still using the AmLactin cream which has been beneficial for him as well. 3/22; left foot wound is closed. There is no open area here. He is using ammonium lactate lotion to the lower extremities to help exfoliate dry cracked skin. He has compression stockings from elastic therapy in [place]. The wound on the medial part of his left first met head is healed today. READMISSION 04/12/2021 Mr. Doner is a patient we know fairly well he had a prolonged stay in clinic in 2019 with wounds on his left lateral and left anterior lower extremity in the setting of chronic venous insufficiency. More recently he was here earlier this year with predominantly an area on his left foot first metatarsal head plantar and he says the plantar foot broke down on its not long after we discharged him but he did not come back here. The last few months areas of broken down on his left anterior and again the left lateral lower extremity. The leg itself is very swollen chronically enlarged a lot of hyperkeratotic dry Berry Q skin in the left lower leg. His edema extends well into the thigh. He was seen by Dr. Donzetta Matters. He had ABIs on 03/02/2021 showing an ABI on the right of 1 with a TBI of 0.72 his ABI in the left at 1.09 TBI of 0.99. Monophasic and biphasic waveforms on the right. On the left monophasic waveforms were noted he went on to have an angiogram on 03/27/2021 this showed the  aortic aortic and iliac segments were free of flow-limiting stenosis the left common femoral vein to evaluate the left femoral to anterior tibial  artery bypass was unobstructed the bypass was patent without any areas of stenosis. We discharged the patient in bilateral juxta lite stockings but very clearly that was not sufficient to control the swelling and maintain skin integrity. He is clearly going to need compression pumps. 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 This is fortunate because he is on his feet for long periods of time. 10/27; patient comes in with our intake nurse reporting copious amount of green drainage from the left anterior mid tibia the left dorsal foot and to a lesser extent the left medial mid tibia. We left the compression wrap on all week for the amount of edema in his left leg is quite a bit better. We use silver alginate as the primary dressing 11/3; edema control is good. Left anterior lower leg left medial lower leg and the plantar first metatarsal head. The left anterior lower leg required debridement. Deep tissue culture I did of this wound showed MRSA I put him on 10 days of doxycycline which she will start today. We have him in compression wraps. He has a security card and [employer] however he is retiring on November 15. We will need to then get him into a better offloading boot for the left foot perhaps a total contact cast 11/10; edema control is quite good. Left anterior and left medial lower leg wounds in the setting of chronic venous insufficiency and lymphedema. He also has a substantial area over the left plantar first metatarsal head. 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 He has significant hypergranulation on the left plantar foot wound. The patient is a diabetic but he does not have significant PAD 11/17;  edema control is quite good. Left anterior and left medial lower leg wounds look better. The really concerning area remains the area on the left plantar first metatarsal head. He has a rim of epithelialization. 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 Starting today with a forefoot off loader but . possibly a total contact cast. 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 Arrives with a very significant odor from I think all of the wounds on the left leg and the left foot. Because of this I did not put a total contact cast on him today but will could still consider this. His wife was having cataract surgery which is the reason he missed the appointment 12/6. I saw this man 5 days ago with a swelling below the popliteal fossa. I thought he actually might have a Baker's cyst however the DVT rule out study that we could arrange right away was negative the technician told me this was not a ruptured Baker's cyst. We attempted to get this aspirated by under ultrasound guidance in interventional radiology however all they did was an ultrasound however it shows an extensive fluid collection 62 x 8 x 9.4 in the left thigh and left calf. The patient states he thinks this started 8 days ago or so but he really is not complaining of any pain, fever or systemic symptoms. He has not ha  12/20; after some difficulty I managed to get the patient into see Dr. Donzetta Matters. Eventually he was taken into the hospital and had a drain put in the fluid collection below his left knee posteriorly extending into the posterior thigh. He still has the drain in place. Culture of this showed moderate staff aureus few Morganella and few Klebsiella he is now on doxycycline and ciprofloxacin as suggested by infectious  disease he is on this for a month. The drain will remain in place until it stops draining 12/29; he comes in today with the 1 wound on his left leg and the area on the left plantar first met head significantly smaller. Both look healthy. He still has the drain in the left leg. He says he has to change this daily. Follows up with Dr. Donzetta Matters on January 11. 06/29/2021; the wounds that I am following on the left leg and left first met head continued to be quite healthy. However the area where his inferior drain is in place had copious amounts of drainage which was green in color. The wound here is larger. Follows up with Dr. Gwenlyn Saran of vein and vascular his surgeon next week as well as infectious disease. He remains on ciprofloxacin and doxycycline. He is not complaining of excessive pain in either one of the drain areas 1/12; the patient saw vascular surgery and infectious disease. Vascular surgery has left the drain in place as there was still some notable drainage still see him back in 2 weeks. Dr. Velna Ochs stop the doxycycline and ciprofloxacin and I do not believe he follows up with them at this point. Culture I did last week showed both doxycycline resistant MRSA and Pseudomonas not sensitive to ciprofloxacin although only in rare titers 1/19; the patient's wound on the left anterior lower leg is just about healed. We have continued healing of the area that was medially on the left leg. Left first plantar metatarsal head continues to get smaller. The major problem here is his 2 drain sites 1 on the left upper calf and lateral thigh. There is purulent drainage still from the left lateral thigh. I gave him antibiotics last week but we still have recultured. He has the drain in the area I think this is eventually going to have to come out. I suspect there will be a connecting wound to heal here perhaps with improved VAc 1/26; the patient had his drain removed by vein and vascular on 1/25/. This was a large  pocket of fluid in his left thigh that seem to tunnel into his left upper calf. 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 His mention his Penrose drain was removed today. He now has a tunneling wound on his left calf and left thigh. Both of these probe widely towards each other although I cannot really prove that they connect. Both wounds on his lower leg anteriorly are closed and his area over the first metatarsal head on his right foot continues to improve. We are using Hydrofera Blue here. He also saw infectious disease culture of the abscess they noted was polymicrobial with MRSA, Morganella and Klebsiella he was treated with doxycycline and ciprofloxacin for 4 weeks ending on 07/03/2021. They did not recommend any further antibiotics. Notable that while he still had the Penrose drain in place last week he had purulent drainage coming out of the inferior IandD site this grew [facility], MRSA and Pseudomonas but there does not appear to be any active infection in this  area today with the drain out and he is not systemically unwell 2/2; with regards to the drain sites the superior one on the thigh actually is closed down the one on the upper left lateral calf measures about 8 and half centimeters which is an improvement seems to be less prominent although still with a lot of drainage. The only remaining wound is over the first metatarsal head on the left foot and this looks to be continuing to improve with Hydrofera Blue. 2/9; the area on his plantar left foot continues to contract. Callus around the wound edge. The drain sites specifically have not come down in depth. We put the wound VAC on Monday he changed the canister late last night our intake nurse reported a pocket of fluid perhaps caused by our compression wraps 2/16; continued improvement in left foot plantar wound. drainage site in the calf is not improved in terms of depth (wound vac) 2/23; continued improvement in  the left foot wound over the first metatarsal head. With regards to the drain sites the area on his thigh laterally is healed however the open area on his calf is small in terms of circumference by still probes in by about 15 cm. Within using the wound VAC. Hydrofera Blue on his foot 08/24/2021: The left first metatarsal head wound continues to improve. The wound bed is healthy with just some surrounding callus. Unfortunately the open drain site on his calf remains open and tunnels at least 15 cm (the extent of a Q-tip). This is despite several weeks of wound VAC treatment. Based on reading back through the notes, there has been really no significant change in the depth of the wound, although the orifice is smaller and the more cranial wound on his thigh has closed. I suspect the tunnel tracks nearly all the way to this location. 08/31/2021: Continued improvement in the left first metatarsal head wound. There has been absolutely no improvement to the long tunnel from his open drain site on his calf. We have tried to get him into see vascular surgery sooner to consider the possibility of simply filleting the tract open and allowing it to heal from the bottom up, likely with a wound VAC. They have not yet scheduled a sooner appointment than his current mid April 09/14/2021: He was seen by vascular surgery and they took him to the operating room last week. They opened a portion of the tunnel, but did not extend the entire length of the known open subcutaneous tract. I read Dr. Claretha Cooper operative note and it is not clear from that documentation why only a portion of the tract was opened. The heaped up granulation tissue was curetted and removed from at least some portion of the tract. They did place a wound VAC and applied an Unna boot to the leg. The ulcer on his left first metatarsal head is smaller today. The bed looks good and there is just a small amount of surrounding callus. 09/21/2021: The ulcer on his  left first metatarsal head looks to be stalled. There is some callus surrounding the wound but the wound bed itself does not appear particularly dynamic. The tunnel tract on his lateral left leg seems to be roughly the same length or perhaps slightly smaller but the wound bed appears healthy with good granulation tissue. He opened up a new wound on his medial thigh and the site of a prior surgical incision. He says that he did this unconsciously in his sleep by scratching. 09/28/2021: Unfortunately,  the ulcer on his left first metatarsal head has extended underneath the callus toward the dorsum of his foot. The medial thigh wounds are roughly the same. The tunnel on his lateral left leg continues to be problematic; it is longer than we are able to actually probe with a Q-tip. I am still not certain as to why Dr. Donzetta Matters did not open this up entirely when he took the patient to the operating room. We will likely be back in the same situation with just a small superficial opening in a long unhealed tract, as the open portion is granulating in nicely. 10/02/2021: The patient was initially scheduled for a nurse visit, but we are also applying a total contact cast today. The plantar foot wound looks clean without significant accumulated callus. We have been applying Prisma silver collagen to the site. 10/05/2021: The patient is here for his first total contact cast change. We have tried using gauze packing strips in the tunnel on his lateral leg wound, but this does not seem to be working any better than the white VAC foam. The foot ulcer looks about the same with minimal periwound callus. Medial thigh wound is clean with just some overlying eschar. 10/12/2021: The plantar foot wound is stable without any significant accumulation of periwound callus. The surface is viable with good granulation tissue. The medial thigh wounds are much smaller and are epithelializing. On the other hand, he had purulent drainage  coming from the tunnel on his lateral leg. He does go back to see Dr. Donzetta Matters next week and is planning to ask him why the wound tunnel was not completely opened at the time of his most recent operation. 10/19/2021: The plantar foot wound is markedly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 of other part of left foot with other specified severity Quantity: 1 Electronic Signature(s) Signed: 03/22/2022 2:53:26 PM By: Fredirick Maudlin MD FACS Signed: 03/22/2022 5:24:26 PM By: Baruch Gouty RN, BSN Previous Signature: 03/22/2022 11:22:45 AM Version By: Fredirick Maudlin MD FACS Entered By: Baruch Gouty on 03/22/2022 13:14:33

## 2022-03-29 ENCOUNTER — Ambulatory Visit (INDEPENDENT_AMBULATORY_CARE_PROVIDER_SITE_OTHER): Payer: Medicare Other | Admitting: Physician Assistant

## 2022-03-29 ENCOUNTER — Ambulatory Visit (INDEPENDENT_AMBULATORY_CARE_PROVIDER_SITE_OTHER)
Admission: RE | Admit: 2022-03-29 | Discharge: 2022-03-29 | Disposition: A | Discharge status: Home / Self Care | Payer: Medicare Other | Source: Ambulatory Visit | Attending: Vascular Surgery | Admitting: Vascular Surgery

## 2022-03-29 ENCOUNTER — Encounter (HOSPITAL_BASED_OUTPATIENT_CLINIC_OR_DEPARTMENT_OTHER): Payer: Medicare Other | Attending: General Surgery | Admitting: General Surgery

## 2022-03-29 ENCOUNTER — Other Ambulatory Visit: Payer: Self-pay

## 2022-03-29 ENCOUNTER — Ambulatory Visit (HOSPITAL_COMMUNITY)
Admission: RE | Admit: 2022-03-29 | Discharge: 2022-03-29 | Disposition: A | Discharge status: Home / Self Care | Payer: Medicare Other | Source: Ambulatory Visit | Attending: Vascular Surgery | Admitting: Vascular Surgery

## 2022-03-29 VITALS — BP 142/76 | HR 70 | Temp 97.6°F | Ht 73.0 in | Wt 269.6 lb

## 2022-03-29 DIAGNOSIS — Z794 Long term (current) use of insulin: Secondary | ICD-10-CM | POA: Diagnosis not present

## 2022-03-29 DIAGNOSIS — N183 Chronic kidney disease, stage 3 unspecified: Secondary | ICD-10-CM | POA: Diagnosis not present

## 2022-03-29 DIAGNOSIS — I739 Peripheral vascular disease, unspecified: Secondary | ICD-10-CM

## 2022-03-29 DIAGNOSIS — L97528 Non-pressure chronic ulcer of other part of left foot with other specified severity: Secondary | ICD-10-CM | POA: Diagnosis not present

## 2022-03-29 DIAGNOSIS — S81802D Unspecified open wound, left lower leg, subsequent encounter: Secondary | ICD-10-CM | POA: Diagnosis not present

## 2022-03-29 DIAGNOSIS — E1151 Type 2 diabetes mellitus with diabetic peripheral angiopathy without gangrene: Secondary | ICD-10-CM | POA: Diagnosis not present

## 2022-03-29 DIAGNOSIS — I87322 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: Secondary | ICD-10-CM | POA: Diagnosis not present

## 2022-03-29 DIAGNOSIS — E11621 Type 2 diabetes mellitus with foot ulcer: Secondary | ICD-10-CM | POA: Diagnosis present

## 2022-03-29 DIAGNOSIS — E1122 Type 2 diabetes mellitus with diabetic chronic kidney disease: Secondary | ICD-10-CM | POA: Diagnosis not present

## 2022-03-29 DIAGNOSIS — I872 Venous insufficiency (chronic) (peripheral): Secondary | ICD-10-CM | POA: Insufficient documentation

## 2022-03-29 DIAGNOSIS — L97828 Non-pressure chronic ulcer of other part of left lower leg with other specified severity: Secondary | ICD-10-CM | POA: Diagnosis not present

## 2022-03-29 DIAGNOSIS — I779 Disorder of arteries and arterioles, unspecified: Secondary | ICD-10-CM

## 2022-03-29 DIAGNOSIS — I890 Lymphedema, not elsewhere classified: Secondary | ICD-10-CM | POA: Insufficient documentation
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
## 2022-03-29 NOTE — Progress Notes (Signed)
[name] ([id number]) Visit Report for 03/29/2022 Chief Complaint Document Details Patient Name: Date of Service: [name], [name] 03/29/2022 10:30 A M Medical Record Number: [id number] Patient Account Number: [id number] Date of Birth/Sex: Treating RN: October 11, 1950 (71 y.o. M) Primary Care Provider: [name] Other Clinician: Referring Provider: Treating Provider/Extender: [name] in Treatment: 9 Information Obtained from: Patient Chief Complaint Left leg and foot ulcers 04/12/2021; patient is here for wounds on his left lower leg and left plantar foot over the first metatarsal head Electronic Signature(s) Signed: 03/29/2022 11:57:14 AM By: Fredirick Maudlin MD FACS Entered By: Fredirick Maudlin on 03/29/2022 11:57:13 -------------------------------------------------------------------------------- Debridement Details Patient Name: Date of Service: [name]. 03/29/2022 10:30 A M Medical Record Number: [id number] Patient Account Number: [id number] Date of Birth/Sex: Treating RN: 09-10-1950 (71 y.o. [name] Primary Care Provider: [name] Other Clinician: Referring Provider: Treating Provider/Extender: [name] in Treatment: 50 Debridement Performed for Assessment: Wound #22 Left,Lateral Lower Leg Performed By: Physician Fredirick Maudlin, MD Debridement Type: Debridement Level of Consciousness (Pre-procedure): Awake and Alert Pre-procedure Verification/Time Out Yes - 11:20 Taken: Start Time: 11:23 T Area Debrided (L x W): otal 5 (cm) x 1.3 (cm) = 6.5 (cm) Tissue and other material debrided: Non-Viable, Eschar, Slough, Slough Level: Non-Viable Tissue Debridement Description: Selective/Open Wound Instrument: Curette Bleeding: Minimum Hemostasis Achieved: Pressure Procedural Pain: 0 Post Procedural Pain: 0 Response to Treatment: Procedure was tolerated well Level of Consciousness (Post- Awake and  Alert procedure): Post Debridement Measurements of Total Wound Length: (cm) 5.4 Width: (cm) 1.3 Depth: (cm) 0.1 Volume: (cm) 0.551 Character of Wound/Ulcer Post Debridement: Improved Post Procedure Diagnosis Same as Pre-procedure Notes scribed by Baruch Gouty, RN for Dr. Celine Ahr Electronic Signature(s) Signed: 03/29/2022 12:28:30 PM By: Fredirick Maudlin MD FACS Signed: 03/29/2022 6:18:14 PM By: Baruch Gouty RN, BSN Entered By: Baruch Gouty on 03/29/2022 11:26:52 -------------------------------------------------------------------------------- Debridement Details Patient Name: Date of Service: [name]. 03/29/2022 10:30 A M Medical Record Number: [id number] Patient Account Number: [id number] Date of Birth/Sex: Treating RN: 1951/06/02 (71 y.o. [name] Primary Care Provider: [name] Other Clinician: Referring Provider: Treating Provider/Extender: [name] in Treatment: 50 Debridement Performed for Assessment: Wound #27 Left,Medial Foot Performed By: Physician Fredirick Maudlin, MD Debridement Type: Debridement Severity of Tissue Pre Debridement: Fat layer exposed Level of Consciousness (Pre-procedure): Awake and Alert Pre-procedure Verification/Time Out Yes - 11:20 Taken: Start Time: 11:23 T Area Debrided (L x W): otal 3 (cm) x 3 (cm) = 9 (cm) Tissue and other material debrided: Non-Viable, Callus, Skin: Epidermis, Biofilm Level: Skin/Epidermis Debridement Description: Selective/Open Wound Instrument: Curette Bleeding: Minimum Hemostasis Achieved: Pressure Procedural Pain: 0 Post Procedural Pain: 0 Response to Treatment: Procedure was tolerated well Level of Consciousness (Post- Awake and Alert procedure): Post Debridement Measurements of Total Wound Length: (cm) 1.3 Width: (cm) 2 Depth: (cm) 0.1 Volume: (cm) 0.204 Character of Wound/Ulcer Post Debridement: Improved Severity of Tissue Post Debridement: Fat  layer exposed Post Procedure Diagnosis Same as Pre-procedure Notes scribed by Baruch Gouty, RN for Dr. Celine Ahr Electronic Signature(s) Signed: 03/29/2022 12:28:30 PM By: Fredirick Maudlin MD FACS Signed: 03/29/2022 6:18:14 PM By: Baruch Gouty RN, BSN Entered By: Baruch Gouty on 03/29/2022 11:27:12 -------------------------------------------------------------------------------- HPI Details Patient Name: Date of Service: [name]. 03/29/2022 10:30 A M Medical Record Number: [id number] Patient Account Number: [id number] Date of Birth/Sex: Treating RN: November 25, 1950 (71 y.o. M) Primary Care Provider: [name] Other Clinician: Referring Provider: Treating  Provider/Extender: [name] in Treatment: 50 History of Present Illness HPI Description: 10/11/17; [name] is a 71 year old man who tells me that in 2015 he slipped down the latter traumatizing his left leg. He developed a wound in the same spot the area that we are currently looking at. He states this closed over for the most part although he always felt it was somewhat unstable. In 2016 he hit the same area with the door of his car had this reopened. He tells me that this is never really closed although sometimes an inflow it remains open on a constant basis. He has not been using any specific dressing to this except for topical antibiotics the nature of which were not really sure. His primary doctor did send him to see Dr. [name] of interventional cardiology. He underwent an angiogram on 08/06/17 and he underwent a PTA and directional atherectomy of the lesser distal SFA and popliteal arteries which resulted in brisk improvement in blood flow. It was noted that he had 2 vessel runoff through the anterior tibial and peroneal. He is also been to see vascular and interventional radiologist. He was not felt to have any significant superficial venous insufficiency. Presumably is not a candidate for any  ablation. It was suggested he come here for wound care. The patient is a type II diabetic on insulin. He also has a history of venous insufficiency. ABIs on the left were noncompressible in our clinic 10/21/17; patient we admitted to the clinic last week. 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 We put Iodosorb on him after an aggressive debridement and 3 layer compression. He complained of pain in his ankle and itching with is skin in fact he scratched the area on the medial calf superiorly at the rim of our wraps and he has 2 small open areas in that location today which are new. I changed his primary dressing today to silver collagen. As noted he is already had revascularization and does not have any significant superficial venous insufficiency that would be amenable to ablation 10/28/17; patient admitted to the clinic 2 weeks ago. He has a smaller Wound. Scratch injury from last week revealed. There is large wound over the tibial area. This is smaller. Granulation looks healthy. No need for debridement. 11/04/17; the wound on the left lateral calf looks better. Improved dimensions. Surface of this looks better. We've been maintaining him and Kerlix Coban wraps. He finds this much more comfortable. Silver collagen dressing 11/11/17; left lateral Wound continues to look healthy be making progress. Using a #5 curet I removed removed nonviable skin from the surface of the wound and then necrotic debris from the wound surface. Surface of the wound continues to look healthy. He also has an open area on the left great toenail bed. We've been using topical antibiotics. 11/19/17; left anterior lateral wound continues to look healthy but it's not closed. He also had a small wound above this on the left leg Initially traumatic wounds in the setting of significant chronic venous insufficiency and stasis dermatitis 11/25/17; left anterior wounds  superiorly is closed still a small wound inferiorly. 12/02/17; left anterior tibial area. Arrives today with adherent callus. Post debridement clearly not completely closed. Hydrofera Blue under 3 layer compression. 12/09/17; left anterior tibia. Circumferential eschar however the wound bed looks stable to improved. We've been using Hydrofera Blue under 3 layer compression 12/17/17; left anterior tibia. Apparently this  was felt to be closed however when the wrap was taken off there is a skin tear to reopen wounds in the same area we've been using Hydrofera Blue under 3 layer compression 12/23/17 left anterior tibia. Not close to close this week apparently the [facility] was stuck to this again. Still circumferential eschar requiring debridement. I put a contact layer on this this time under the Hydrofera Blue 12/31/17; left anterior tibia. Wound is better slight amount of hyper-granulation. Using Hydrofera Blue over Adaptic. 01/07/18; left anterior tibia. The wound had some surface eschar however after this was removed he has no open wound.he was already revascularized by Dr. [name] when he came to our clinic with atherectomy of the left SFA and popliteal artery. He was also sent to interventional radiology for venous reflux studies. He was not felt to have significant reflux but certainly has chronic venous changes of his skin with hemosiderin deposition around this area. He will definitely need to lubricate his skin and wear compression stocking and I've talked to him about this. READMISSION 05/26/2018 This is a now 71 year old man we cared for with traumatic wounds on his left anterior lower extremity. He had been previously revascularized during that admission by Dr. [name]. Apparently in follow-up Dr. [name] noted that he had deterioration in his arterial status. He underwent a stent placement in the distal left SFA on 04/22/2018. 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 He went back  to the angiography suite on 04/30/2018 he underwent PTA and balloon angioplasty of the occluded left mid anterior tibial artery, thrombotic occlusion went from 100 to 0% which reconstitutes the posterior tibial artery. He had thrombectomy and aspiration of the peroneal artery. The stent placed in the distal SFA left SFA was still occluded. 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 He has been recuperating at [place] home in [facility] after the most recent hospitalization. He is going home tomorrow. He tells me that 2 weeks ago he traumatized the tip of his left fifth toe. He came in urgently for our review of this. This was a history of before I noted that Dr. [name] had already noted dry gangrenous changes of the left fifth toe 06/09/2018; 2-week follow-up. I did contact Dr. [name] after his last appointment and he apparently saw 1 of Dr. Irven Shelling colleagues the next day. He does not follow-up with Dr. [name] himself until Thursday of this week. 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 Nevertheless there is no evidence of infection no drainage and no pain. He had a new area that this week when we were signing him in today on the left anterior mid tibia area, this is in close proximity to the previous wound we have dealt with in this clinic. 06/23/2018; 2-week follow-up. I did not receive a recent note from Dr. [name] to review today. Our office is trying to obtain this. He is apparently not planning to do further vascular interventions and wondered about compression to try and help with the patient's chronic venous insufficiency. However we are also concerned about the arterial flow. He arrives in clinic today with a new area on  the left third toe. The areas on the calf/anterior tibia  are close to closing. The left fifth toe is still mummified using Betadine. -In reviewing things with the patient he has what sounds like claudication with mild to moderate amount of activity. 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 I prescribed Levaquin over the phone while we attempted to arrange a plan of care. However the patient called yesterday to report he had low-grade fever and he came in today acutely. There is been a marked deterioration in the left third toe with spreading cellulitis up into the dorsal left foot. He was referred to the emergency room. Readmission: 06/29/2020 patient presents today for reevaluation here in our clinic he was previously treated by Dr. Dellia Nims at the latter part of 2019 in 2 the beginning of 2020. Subsequently we have not seen him since that time in the interim he did have evaluation with vein and vascular specialist specifically Dr. Anice Paganini who did perform quite extensive work for a left femoral to anterior tibial artery bypass. With that being said in the interim the patient has developed significant lymphedema and has wounds that he tells me have really never healed in regard to the incision site on the left leg. He also has multiple wounds on the feet for various reasons some of which is that he tends to pick at his feet. Fortunately there is no signs of active infection systemically at this time he does have some wounds that are little bit deeper but most are fairly superficial he seems to have good blood flow and overall everything appears to be healthy I see no bone exposed and no obvious signs of osteomyelitis. I do not know that he necessarily needs a x-ray at this point although that something we could consider depending on how things progress. The patient does have a history of lymphedema, diabetes, this is type II, chronic kidney disease stage III,  hypertension, and history of peripheral vascular disease. 07/05/2020; patient admitted last week. Is a patient I remember from 2019 he had a spreading infection involving the left foot and we sent him to the hospital. 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 He subsequently had a left femoral to anterior tibial bypass by Dr.Cain vein and vascular. He also has severe lymphedema with chronic skin changes related to that on the left leg. The most problematic area that was new today was on the left medial great toe. This was apparently a small area last week there was purulent drainage which our intake nurse cultured. Also areas on the left medial foot and heel left lateral foot. He has 2 areas on the left medial calf left lateral calf in the setting of the severe lymphedema. 07/13/2020 on evaluation today patient appears to be doing better in my opinion compared to his last visit. The good news is there is no signs of active infection systemically and locally I do not see any signs of infection either. He did have an x-ray which was negative that is great news he had a culture which showed MRSA but at the same time he is been on the doxycycline which has helped. I do think we may want to extend this for 7 additional days 1/25; patient admitted to the clinic a few weeks ago. 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 We have been putting him under compression his edema control is a lot better but he is severe verricused  skin on the left leg. He is really done quite well he still has an open area on the left medial calf and the left medial first metatarsal head. We have been using silver collagen on the leg silver alginate on the foot 07/27/2020 upon evaluation today patient appears to be doing decently well in regard to his wounds. He still has a lot of dry skin on the left leg. Some of this is starting to peel back and I  think he may be able to have them out by removing some that today. Fortunately there is no signs of active infection at this time on the left leg although on the right leg he does appear to have swelling and erythema as well as some mild warmth to touch. This does have been concerned about the possibility of cellulitis although within the differential diagnosis I do think that potentially a DVT has to be at least considered. We need to rule that out before proceeding would just call in the cellulitis. Especially since he is having pain in the posterior aspect of his calf muscle. 2/8; the patient had seen sparingly. He has severe skin changes of chronic lymphedema in the left leg thickened hyperkeratotic verrucous skin. He has an open wound on the medial part of the left first met head left mid tibia. He also has a rim of nonepithelialized skin in the anterior mid tibia. He brought in the AmLactin lotion that was been prescribed although I am not sure under compression and its utility. There concern about cellulitis on the right lower leg the last time he was here. He was put on on antibiotics. His DVT rule out was negative. The right leg looks fine he is using his stocking on this area 08/10/2020 upon evaluation today patient appears to be doing well with regard to his leg currently. He has been tolerating the dressing changes without complication. Fortunately there is no signs of active infection which is great news. Overall very pleased with where things stand. 2/22; the patient still has an area on the medial part of the left first met his head. This looks better than when I last saw this earlier this month he has a rim of epithelialization but still some surface debris. Mostly everything on the left leg is healed. There is still a vulnerable in the left mid tibia area. 08/30/2020 upon evaluation today patient appears to be doing much better in regard to his wounds on his foot. Fortunately there does  not appear to be any signs of active infection systemically though locally we did culture this last week and it does appear that he does have MRSA currently. Nonetheless I think we will address that today I Minna send in a prescription for him in that regard. Overall though there does not appear to be any signs of significant worsening. 09/07/2020 on evaluation today patient's wounds over his left foot appear to be doing excellent. I do not see any signs of infection there is some callus buildup this can require debridement for certain but overall I feel like he is managing quite nicely. He still using the AmLactin cream which has been beneficial for him as well. 3/22; left foot wound is closed. There is no open area here. He is using ammonium lactate lotion to the lower extremities to help exfoliate dry cracked skin. He has compression stockings from elastic therapy in [place]. The wound on the medial part of his left first met head is healed today. READMISSION 04/12/2021  Mr. Clarin is a patient we know fairly well he had a prolonged stay in clinic in 2019 with wounds on his left lateral and left anterior lower extremity in the setting of chronic venous insufficiency. More recently he was here earlier this year with predominantly an area on his left foot first metatarsal head plantar and he says the plantar foot broke down on its not long after we discharged him but he did not come back here. The last few months areas of broken down on his left anterior and again the left lateral lower extremity. The leg itself is very swollen chronically enlarged a lot of hyperkeratotic dry Berry Q skin in the left lower leg. His edema extends well into the thigh. He was seen by Dr. Donzetta Matters. He had ABIs on 03/02/2021 showing an ABI on the right of 1 with a TBI of 0.72 his ABI in the left at 1.09 TBI of 0.99. Monophasic and biphasic waveforms on the right. On the left monophasic waveforms were noted he went on to have an  angiogram on 03/27/2021 this showed the aortic aortic and iliac segments were free of flow-limiting stenosis the left common femoral vein to evaluate the left femoral to anterior tibial artery bypass was unobstructed the bypass was patent without any areas of stenosis. We discharged the patient in bilateral juxta lite stockings but very clearly that was not sufficient to control the swelling and maintain skin integrity. He is clearly going to need compression pumps. 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 This is fortunate because he is on his feet for long periods of time. 10/27; patient comes in with our intake nurse reporting copious amount of green drainage from the left anterior mid tibia the left dorsal foot and to a lesser extent the left medial mid tibia. We left the compression wrap on all week for the amount of edema in his left leg is quite a bit better. We use silver alginate as the primary dressing 11/3; edema control is good. Left anterior lower leg left medial lower leg and the plantar first metatarsal head. The left anterior lower leg required debridement. Deep tissue culture I did of this wound showed MRSA I put him on 10 days of doxycycline which she will start today. We have him in compression wraps. He has a security card and [employer] however he is retiring on November 15. We will need to then get him into a better offloading boot for the left foot perhaps a total contact cast 11/10; edema control is quite good. Left anterior and left medial lower leg wounds in the setting of chronic venous insufficiency and lymphedema. He also has a substantial area over the left plantar first metatarsal head. 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 He has significant hypergranulation on the left plantar foot wound. The patient is a diabetic but he does  not have significant PAD 11/17; edema control is quite good. Left anterior and left medial lower leg wounds look better. The really concerning area remains the area on the left plantar first metatarsal head. He has a rim of epithelialization. 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 Starting today with a forefoot off loader but .  possibly a total contact cast. 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 Arrives with a very significant odor from I think all of the wounds on the left leg and the left foot. Because of this I did not put a total contact cast on him today but will could still consider this. His wife was having cataract surgery which is the reason he missed the appointment 12/6. I saw this man 5 days ago with a swelling below the popliteal fossa. I thought he actually might have a Baker's cyst however the DVT rule out study that we could arrange right away was negative the technician told me this was not a ruptured Baker's cyst. We attempted to get this aspirated by under ultrasound guidance in interventional radiology however all they did was an ultrasound however it shows an extensive fluid collection 62 x 8 x 9.4 in the left thigh and left calf. The patient states he thinks this started 8 days ago or so but he really is not complaining of any pain, fever or systemic symptoms. He has not ha 12/20; after some difficulty I managed to get the patient into see Dr. Donzetta Matters. Eventually he was taken into the hospital and had a drain put in the fluid collection below his left knee posteriorly extending into the posterior thigh. He still has the drain in place. Culture of this showed moderate staff aureus few Morganella and few Klebsiella he is now on doxycycline and  ciprofloxacin as suggested by infectious disease he is on this for a month. The drain will remain in place until it stops draining 12/29; he comes in today with the 1 wound on his left leg and the area on the left plantar first met head significantly smaller. Both look healthy. He still has the drain in the left leg. He says he has to change this daily. Follows up with Dr. Donzetta Matters on January 11. 06/29/2021; the wounds that I am following on the left leg and left first met head continued to be quite healthy. However the area where his inferior drain is in place had copious amounts of drainage which was green in color. The wound here is larger. Follows up with Dr. Gwenlyn Saran of vein and vascular his surgeon next week as well as infectious disease. He remains on ciprofloxacin and doxycycline. He is not complaining of excessive pain in either one of the drain areas 1/12; the patient saw vascular surgery and infectious disease. Vascular surgery has left the drain in place as there was still some notable drainage still see him back in 2 weeks. Dr. Velna Ochs stop the doxycycline and ciprofloxacin and I do not believe he follows up with them at this point. Culture I did last week showed both doxycycline resistant MRSA and Pseudomonas not sensitive to ciprofloxacin although only in rare titers 1/19; the patient's wound on the left anterior lower leg is just about healed. We have continued healing of the area that was medially on the left leg. Left first plantar metatarsal head continues to get smaller. The major problem here is his 2 drain sites 1 on the left upper calf and lateral thigh. There is purulent drainage still from the left lateral thigh. I gave him antibiotics last week but we still have recultured. He has the drain in the area I think this is eventually going to have to come  out. I suspect there will be a connecting wound to heal here perhaps with improved VAc 1/26; the patient had his drain removed by vein and  vascular on 1/25/. This was a large pocket of fluid in his left thigh that seem to tunnel into his left upper calf. 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 His mention his Penrose drain was removed today. He now has a tunneling wound on his left calf and left thigh. Both of these probe widely towards each other although I cannot really prove that they connect. Both wounds on his lower leg anteriorly are closed and his area over the first metatarsal head on his right foot continues to improve. We are using Hydrofera Blue here. He also saw infectious disease culture of the abscess they noted was polymicrobial with MRSA, Morganella and Klebsiella he was treated with doxycycline and ciprofloxacin for 4 weeks ending on 07/03/2021. They did not recommend any further antibiotics. Notable that while he still had the Penrose drain in place last week he had purulent drainage coming out of the inferior IandD site this grew [facility], MRSA and Pseudomonas but there does not appear to be any active infection in this area today with the drain out and he is not systemically unwell 2/2; with regards to the drain sites the superior one on the thigh actually is closed down the one on the upper left lateral calf measures about 8 and half centimeters which is an improvement seems to be less prominent although still with a lot of drainage. The only remaining wound is over the first metatarsal head on the left foot and this looks to be continuing to improve with Hydrofera Blue. 2/9; the area on his plantar left foot continues to contract. Callus around the wound edge. The drain sites specifically have not come down in depth. We put the wound VAC on Monday he changed the canister late last night our intake nurse reported a pocket of fluid perhaps caused by our compression wraps 2/16; continued improvement in left foot plantar wound. drainage site in the calf is not improved in terms of depth (wound  vac) 2/23; continued improvement in the left foot wound over the first metatarsal head. With regards to the drain sites the area on his thigh laterally is healed however the open area on his calf is small in terms of circumference by still probes in by about 15 cm. Within using the wound VAC. Hydrofera Blue on his foot 08/24/2021: The left first metatarsal head wound continues to improve. The wound bed is healthy with just some surrounding callus. Unfortunately the open drain site on his calf remains open and tunnels at least 15 cm (the extent of a Q-tip). This is despite several weeks of wound VAC treatment. Based on reading back through the notes, there has been really no significant change in the depth of the wound, although the orifice is smaller and the more cranial wound on his thigh has closed. I suspect the tunnel tracks nearly all the way to this location. 08/31/2021: Continued improvement in the left first metatarsal head wound. There has been absolutely no improvement to the long tunnel from his open drain site on his calf. We have tried to get him into see vascular surgery sooner to consider the possibility of simply filleting the tract open and allowing it to heal from the bottom up, likely with a wound VAC. They have not yet scheduled a sooner appointment than his current mid  April 09/14/2021: He was seen by vascular surgery and they took him to the operating room last week. They opened a portion of the tunnel, but did not extend the entire length of the known open subcutaneous tract. I read Dr. Claretha Cooper operative note and it is not clear from that documentation why only a portion of the tract was opened. The heaped up granulation tissue was curetted and removed from at least some portion of the tract. They did place a wound VAC and applied an Unna boot to the leg. The ulcer on his left first metatarsal head is smaller today. The bed looks good and there is just a small amount of surrounding  callus. 09/21/2021: The ulcer on his left first metatarsal head looks to be stalled. There is some callus surrounding the wound but the wound bed itself does not appear particularly dynamic. The tunnel tract on his lateral left leg seems to be roughly the same length or perhaps slightly smaller but the wound bed appears healthy with good granulation tissue. He opened up a new wound on his medial thigh and the site of a prior surgical incision. He says that he did this unconsciously in his sleep by scratching. 09/28/2021: Unfortunately, the ulcer on his left first metatarsal head has extended underneath the callus toward the dorsum of his foot. The medial thigh wounds are roughly the same. The tunnel on his lateral left leg continues to be problematic; it is longer than we are able to actually probe with a Q-tip. I am still not certain as to why Dr. Donzetta Matters did not open this up entirely when he took the patient to the operating room. We will likely be back in the same situation with just a small superficial opening in a long unhealed tract, as the open portion is granulating in nicely. 10/02/2021: The patient was initially scheduled for a nurse visit, but we are also applying a total contact cast today. The plantar foot wound looks clean without significant accumulated callus. We have been applying Prisma silver collagen to the site. 10/05/2021: The patient is here for his first total contact cast change. We have tried using gauze packing strips in the tunnel on his lateral leg wound, but this does not seem to be working any better than the white VAC foam. The foot ulcer looks about the same with minimal periwound callus. Medial thigh wound is clean with just some overlying eschar. 10/12/2021: The plantar foot wound is stable without any significant accumulation of periwound callus. The surface is viable with good granulation tissue. The medial thigh wounds are much smaller and are epithelializing. On the other  hand, he had purulent drainage coming from the tunnel on his lateral leg. He does go back to see Dr. Donzetta Matters next week and is planning to ask him why the wound tunnel was not completely opened at the time of his most recent operation. 10/19/2021: The plantar foot wound is markedly improved and has epithelial tissue coming through the surface. The medial thigh wounds are nearly closed with just a tiny open area. He did see Dr. Donzetta Matters earlier this week and apparently they did discuss the possibility of opening the sinus tract further and enabling a wound VAC application. Apparently there are some limits as to what Dr. 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 I think if we could get the tract open to the level of the popliteal fossa, this would greatly aid in her ability to get this chart  closed. That being said, however, today when I probed the tract with a Q-tip, I was not able to insert the entirety of the Q-tip as I have on previous occasions. The tunnel is shorter by about 4 cm. The surface is clean with good granulation tissue and no further episodes of purulent drainage. 10/30/2021: Last week, the patient underwent surgery and had the long tract in his leg opened. There was a rind that was debrided, according to the operative report. His medial thigh ulcers are closed. The plantar foot wound is clean with a good surface and some built up surrounding callus. 11/06/2021: The overall dimensions of the large wound on his lateral leg remain about the same, but there is good granulation tissue present and the tunneling is a little bit shorter. He has a new wound on his anterior tibial surface, in the same location where he had a similar lesion in the past. The plantar foot wound is clean with some buildup surrounding callus. Just toward the medial aspect of his foot, however, there is an area of darkening that once debrided, revealed another opening in the skin  surface. 11/13/2021: The anterior tibial surface wound is closed. The plantar foot wound has some surrounding callus buildup. The area of darkening that I debrided last week and revealed an opening in the skin surface has closed again. The tunnel in the large wound on his lateral leg has come in by about 3 cm. There is healthy granulation tissue on the entire wound surface. 11/23/2021: The patient was out of town last week and did wet-to-dry dressings on his large wound. 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 Unfortunately, he picked open the wound on his left medial thigh. He says that it was itching and he just could not stop scratching it until it was open again. The wound on his plantar foot is smaller and has not accumulated a tremendous amount of callus. The lateral leg wound is shallower and the tunnel has also decreased in depth. There is just a little bit of slough accumulation on the surface. 11/30/2021: Another portion of his left medial thigh has been opened up. All of these wounds are fairly superficial with just a little bit of slough and eschar accumulation. The wound on his plantar foot is almost closed with just a bit of eschar and periwound callus accumulation. The lateral leg wound is nearly flush with the surrounding skin and the tunnel is markedly shallower. 12/07/2021: There is just 1 open area on his left medial thigh. It is clean with just a little bit of perimeter eschar. The wound on his plantar foot continues to contract and just has some eschar and periwound callus accumulation. The lateral leg wound is closing at the more distal aspect and the tunnel is smaller. The surface is nearly flush with the surrounding skin and it has a good bed of granulation tissue. 12/14/2021: The thigh and foot wounds are closed. The lateral leg wound has closed over approximately half of its length. The tunnel continues to contract  and the surface is now flush with the surrounding skin. The wound bed has robust granulation tissue. 12/22/2021: The thigh and foot wounds have reopened. The foot wound has a lot of callus accumulation around and over it. The thigh wound is tiny with just a little bit of slough in the wound bed. The lateral leg wound continues to contract. His vascular surgeon took the  wound VAC off earlier in the week and the patient has been doing wet-to-dry dressings. There is a little slough accumulation on the surface. The tunnel is about 3 cm in depth at this point. 12/28/2021: The thigh wound is closed again. The foot wound has some callus that subsequently has peeled back exposing just a small slit of a wound. The lateral leg wound Is down to about half the size that it originally was and the tunnel is down to about half a centimeter in depth. 01/04/2022: The thigh wound remains closed. The foot wound has heavy callus overlying the wound site. Once this was debrided, the wound was found to be closed. The lateral leg wound is smaller again this week and very superficial. No tunnel could be identified. 01/12/2022: The thigh and foot wounds both remain closed. The lateral leg wound is now nearly flush with the skin surface. There is good granulation tissue present with a light layer of slough. 01/19/2022: Due to the way his wrap was placed, the patient did not change the dressing on his thigh at all and so the foam was saturated and his skin is macerated. There is a light layer of slough on the wound surface. The underlying granulation tissue is robust and healthy-appearing. He has heavy callus buildup at the site of his first metatarsal head wound which is still healed. 02/01/2022: He has been in silver alginate. When he removed the dressing from his thigh wound, however, some leg, superficially reopening a portion of the wound that had healed. In addition, underneath the callus at his left first metatarsal head, there  appears to be a blister and the wound appears to be open again. 02/08/2022: The lateral leg wound has contracted substantially. There is eschar and a light layer of slough present. He says that it is starting to pull and is uncomfortable. On inspection, there is some puckering of the scar and the eschar is quite dry; this may account for his symptoms. On his first metatarsal head, the wound is much smaller with just some eschar on the surface. The callus has not reaccumulated. He reports that he had a blister come up on his medial thigh wound at the distal aspect. It popped and there is now an opening in his skin again. Looking back through his [place] of wound photos, there is what looks like a permanent suture just deep to this location and it may be trying to erode through. We have been using silver alginate on his wounds. 02/15/2022: The lateral leg wound is about half the size it was last week. It is clean with just a little perimeter eschar and light slough. The wound on his first metatarsal head is about the same with heavy callus overlying it. The medial thigh wound is closed again. He does have some skin changes on the top of his foot that looks potentially yeast related. 02/22/2022: The skin on the top of his foot improved with the use of a topical antifungal. The lateral leg wound continues to contract and is again smaller this week. There is a little bit of slough and eschar on the surface. The first metatarsal head wound is a little bit smaller but has reaccumulated a thick callus over the top. He decided to try to trim his toenail and ultimately took the entire nail off of his left great toe. 03/02/2022: His lateral leg wound continues to improve, as does the wound on his left great toe. Unfortunately, it appears that somehow his foot  got wet and moisture seeped in through the opening causing his skin to lift. There is a large wound now overlying his first metatarsal on both the plantar,  medial, and dorsal portion of his foot. There is necrotic tissue and slough present underneath the shaggy macerated skin. 03/08/2022: The lateral leg wound is smaller again today. There is just a light layer of slough and eschar on the surface. The great toe wound is smaller again today. The first metatarsal wound is a little bit smaller today and does not look nearly as necrotic and macerated. There is still slough and nonviable tissue present. 03/15/2022: The lateral leg wound is narrower and just has a little bit of light slough buildup. The first metatarsal wound still has a fair amount of moisture affecting the periwound skin. The great toe wound is healed. 03/22/2022: The lateral leg wound is now isolated to just at the level of his knee. There is some eschar and slough accumulation. The first metatarsal head wound has epithelialized tremendously and is about half the size that it was last week. He still has some maceration on the top of his foot and a fungal odor is present. 03/29/2022: T oday the patient's foot was macerated, suggesting that the cast got wet. The patient has also been picking at his dry skin and has enlarged the wound on his left lateral leg. In the time between having his cast removed and my evaluation, he had picked more dry skin and opened up additional wounds on his Achilles area and dorsal foot. The plantar first metatarsal head wound, however, is smaller and clean with just macerated callus around the perimeter and light slough on the surface. The lateral leg wound measured a little bit larger but is also fairly clean with eschar and minimal slough. Electronic Signature(s) Signed: 03/29/2022 11:59:37 AM By: Fredirick Maudlin MD FACS Entered By: Fredirick Maudlin on 03/29/2022 11:59:37 -------------------------------------------------------------------------------- Physical Exam Details Patient Name: Date of Service: [name]. 03/29/2022 10:30 A M Medical Record  Number: [id number] Patient Account Number: [id number] Date of Birth/Sex: Treating RN: 19-Jun-1951 (71 y.o. M) Primary Care Provider: [name] Other Clinician: Referring Provider: Treating Provider/Extender: [name] in Treatment: 50 Constitutional . . . . No acute distress.Marland Kitchen Respiratory Normal work of breathing on room air.. Notes 03/29/2022: T oday the patient's foot was macerated, suggesting that the cast got wet. The patient has also been picking at his dry skin and has enlarged the wound on his left lateral leg. In the time between having his cast removed and my evaluation, he had picked more dry skin and opened up additional wounds on his Achilles area and dorsal foot. The plantar first metatarsal head wound, however, is smaller and clean with just macerated callus around the perimeter and light slough on the surface. The lateral leg wound measured a little bit larger but is also fairly clean with eschar and minimal slough. Electronic Signature(s) Signed: 03/29/2022 12:01:30 PM By: Fredirick Maudlin MD FACS Entered By: Fredirick Maudlin on 03/29/2022 12:01:30 -------------------------------------------------------------------------------- Physician Orders Details Patient Name: Date of Service: [name]. 03/29/2022 10:30 A M Medical Record Number: [id number] Patient Account Number: [id number] Date of Birth/Sex: Treating RN: 1950/10/18 (71 y.o. 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 - Dr Celine Ahr - Room 1 TCC Return A Anesthetic Wound #22 Left,Lateral Lower Leg (In clinic) Topical Lidocaine 4% applied to wound bed Wound #27 Left,Medial Foot (In clinic) Topical Lidocaine 4% applied to wound bed Bathing/ Shower/ Hygiene May shower with protection but do not get wound dressing(s) wet. - Use a cast protector so you can shower without getting your cast) wet Edema Control - Lymphedema / SCD / Other Elevate legs to the level of the heart or above for 30 minutes daily and/or when sitting, a frequency of: - throughout the day Avoid standing for long periods of time. Patient to wear own compression stockings every day. - on right leg; Moisturize legs daily. - Ammonium lactate to right leg daily Compression stocking or Garment 20-30 mm/Hg pressure to: - left leg daily Off-Loading Other: - minimal weight bearing left foot Wound Treatment Wound #22 - Lower Leg Wound Laterality: Left, Lateral Cleanser: Soap and Water 3 x Per Week/30 Days Discharge Instructions: May shower and wash wound with dial antibacterial soap and water prior to dressing change. Cleanser: Wound Cleanser 3 x Per Week/30 Days Discharge Instructions: Cleanse the wound with wound cleanser prior to applying a clean dressing using gauze sponges, not tissue or cotton balls. Peri-Wound Care: Sween Lotion (Moisturizing lotion) 3 x Per Week/30 Days Discharge Instructions: Apply moisturizing lotion to the leg Prim Dressing: KerraCel Ag Gelling Fiber Dressing, 4x5 in (silver alginate) 3 x Per Week/30 Days ary Discharge Instructions: Apply silver alginate to wound bed as instructed Secondary Dressing: ABD Pad, 8x10 (Generic) 3 x Per Week/30 Days Discharge Instructions: Apply over primary dressing as directed. Secured With: Elastic  Bandage 4 inch (ACE bandage) 3 x Per Week/30 Days Discharge Instructions: Secure with ACE bandage as directed. Wound #27 - Foot Wound Laterality: Left, Medial Peri-Wound Care: Sween Lotion (Moisturizing lotion) 1 x Per Week Discharge Instructions: Apply moisturizing lotion as directed Prim Dressing: Hydrofera Blue Classic Foam, 4x4 in 1 x Per Week ary Discharge Instructions: Moisten with saline prior to applying to wound bed Secondary Dressing: Optifoam Non-Adhesive Dressing, 4x4 in 1 x Per Week Discharge Instructions: Apply over primary dressing cut to make foam donut Secondary Dressing: Woven Gauze Sponges 2x2 in 1 x Per Week Discharge Instructions: Apply over primary dressing as directed. Secured With: 84M Medipore H Soft Cloth Surgical T ape, 4 x 10 (in/yd) 1 x Per Week Discharge Instructions: Secure with tape as directed. Electronic Signature(s) Signed: 03/29/2022 12:28:30 PM By: Fredirick Maudlin MD FACS Entered By: Fredirick Maudlin on 03/29/2022 12:01:52 -------------------------------------------------------------------------------- Problem List Details Patient Name: Date of Service: [name]. 03/29/2022 10:30 A M Medical Record Number: [id number] Patient Account Number: [id number] Date of Birth/Sex: Treating RN: 1950/10/20 (71 y.o. M) Primary Care Provider: [name] Other Clinician: Referring Provider: Treating Provider/Extender: [name] in Treatment: 60 Active Problems ICD-10 Encounter Code Description Active Date MDM Diagnosis 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 04/12/2021 No Yes I89.0 Lymphedema, not elsewhere classified 04/12/2021 No Yes I87.322 Chronic venous hypertension (idiopathic) with inflammation of left lower 04/12/2021 No Yes extremity L97.828 Non-pressure chronic ulcer of other part of left lower leg with other specified 04/12/2021 No Yes severity  J19.417 Non-pressure  chronic ulcer of other part of left foot with other specified 04/12/2021 No Yes severity Inactive Problems ICD-10 Code Description Active Date Inactive Date E11.621 Type 2 diabetes mellitus with foot ulcer 04/12/2021 04/12/2021 E11.42 Type 2 diabetes mellitus with diabetic polyneuropathy 04/12/2021 04/12/2021 L02.416 Cutaneous abscess of left lower limb 06/13/2021 06/13/2021 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 03/29/2022 10:30 A M Medical Record Number: [id number] Patient Account Number: [id number] Date of Birth/Sex: Treating RN: Jan 24, 1951 (71 y.o. M) Primary Care Provider: [name] Other Clinician: Referring Provider: Treating Provider/Extender: [name] in Treatment: 74 Subjective Chief Complaint Information obtained from Patient Left leg and foot ulcers 04/12/2021; patient is here for wounds on his left lower leg and left plantar foot over the first metatarsal head History of Present Illness (HPI) 10/11/17; Mr. Noteboom is a 71 year old man who tells me that in 2015 he slipped down the latter traumatizing his left leg. He developed a wound in the same spot the area that we are currently looking at. He states this closed over for the most part although he always felt it was somewhat unstable. In 2016 he hit the same area with the door of his car had this reopened. He tells me that this is never really closed although sometimes an inflow it remains open on a constant basis. He has not been using any specific dressing to this except for topical antibiotics the nature of which were not really  sure. His primary doctor did send him to see Dr. [name] of interventional cardiology. He underwent an angiogram on 08/06/17 and he underwent a PTA and directional atherectomy of the lesser distal SFA and popliteal arteries which resulted in brisk improvement in blood flow. It was noted that he had 2 vessel runoff through the anterior tibial and peroneal. He is also been to see vascular and interventional radiologist. He was not felt to have any significant superficial venous insufficiency. Presumably is not a candidate for any ablation. It was suggested he come here for wound care. The patient is a type II diabetic on insulin. He also has a history of venous insufficiency. ABIs on the left were noncompressible in our clinic 10/21/17; patient we admitted to the clinic last week. 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 We put Iodosorb on him after an aggressive debridement and 3 layer compression. He complained of pain in his ankle and itching with is skin in fact he scratched the area on the medial calf superiorly at the rim of our wraps and he has 2 small open areas in that location today which are new. I changed his primary dressing today to silver collagen. As noted he is already had revascularization and does not have any significant superficial venous insufficiency that would be amenable to ablation 10/28/17; patient admitted to the clinic 2 weeks ago. He has a smaller Wound. Scratch injury from last week revealed. There is large wound over the tibial area. This is smaller. Granulation looks healthy. No need for debridement. 11/04/17; the wound on the left lateral calf looks better. Improved dimensions. Surface of this looks better. We've been maintaining him and Kerlix Coban wraps. He finds this much more comfortable. Silver  collagen dressing 11/11/17; left lateral Wound continues to look healthy be making progress. Using a #5 curet I  removed removed nonviable skin from the surface of the wound and then necrotic debris from the wound surface. Surface of the wound continues to look healthy. ooHe also has an open area on the left great toenail bed. We've been using topical antibiotics. 11/19/17; left anterior lateral wound continues to look healthy but it's not closed. ooHe also had a small wound above this on the left leg ooInitially traumatic wounds in the setting of significant chronic venous insufficiency and stasis dermatitis 11/25/17; left anterior wounds superiorly is closed still a small wound inferiorly. 12/02/17; left anterior tibial area. Arrives today with adherent callus. Post debridement clearly not completely closed. Hydrofera Blue under 3 layer compression. 12/09/17; left anterior tibia. Circumferential eschar however the wound bed looks stable to improved. We've been using Hydrofera Blue under 3 layer compression 12/17/17; left anterior tibia. Apparently this was felt to be closed however when the wrap was taken off there is a skin tear to reopen wounds in the same area we've been using Hydrofera Blue under 3 layer compression 12/23/17 left anterior tibia. Not close to close this week apparently the [facility] was stuck to this again. Still circumferential eschar requiring debridement. I put a contact layer on this this time under the Hydrofera Blue 12/31/17; left anterior tibia. Wound is better slight amount of hyper-granulation. Using Hydrofera Blue over Adaptic. 01/07/18; left anterior tibia. The wound had some surface eschar however after this was removed he has no open wound.he was already revascularized by Dr. [name] when he came to our clinic with atherectomy of the left SFA and popliteal artery. He was also sent to interventional radiology for venous reflux studies. He was not felt to have significant reflux but certainly has chronic venous changes of his skin with hemosiderin deposition around this area. He  will definitely need to lubricate his skin and wear compression stocking and I've talked to him about this. READMISSION 05/26/2018 This is a now 71 year old man we cared for with traumatic wounds on his left anterior lower extremity. He had been previously revascularized during that admission by Dr. [name]. Apparently in follow-up Dr. [name] noted that he had deterioration in his arterial status. He underwent a stent placement in the distal left SFA on 04/22/2018. 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 He went back to the angiography suite on 04/30/2018 he underwent PTA and balloon angioplasty of the occluded left mid anterior tibial artery, thrombotic occlusion went from 100 to 0% which reconstitutes the posterior tibial artery. He had thrombectomy and aspiration of the peroneal artery. The stent placed in the distal SFA left SFA was still occluded. 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 He has been recuperating at [facility] after the most recent hospitalization. He is going home tomorrow. He tells me that 2 weeks ago he traumatized the tip of his left fifth toe. He came in urgently for our review of this. This was a history of before I noted that Dr. [name] had already noted dry gangrenous changes of the left fifth toe 06/09/2018; 2-week follow-up. I did contact Dr. [name] after his last appointment and he  apparently saw 1 of Dr. Irven Shelling colleagues the next day. He does not follow-up with Dr. [name] himself until Thursday of this week. 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 Nevertheless there is no evidence of infection no drainage and no pain. He had a new area that this week when we  were signing him in today on the left anterior mid tibia area, this is in close proximity to the previous wound we have dealt with in this clinic. 06/23/2018; 2-week follow-up. I did not receive a recent note from Dr. [name] to review today. Our office is trying to obtain this. He is apparently not planning to do further vascular interventions and wondered about compression to try and help with the patient's chronic venous insufficiency. However we are also concerned about the arterial flow. ooHe arrives in clinic today with a new area on the left third toe. The areas on the calf/anterior tibia are close to closing. The left fifth toe is still mummified using Betadine. -In reviewing things with the patient he has what sounds like claudication with mild to moderate amount of activity. 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 I prescribed Levaquin over the phone while we attempted to arrange a plan of care. However the patient called yesterday to report he had low-grade fever and he came in today acutely. There is been a marked deterioration in the left third toe with spreading cellulitis up into the dorsal left foot. He was referred to the emergency room. Readmission: 06/29/2020 patient presents today for reevaluation here in our clinic he was previously treated by Dr. Dellia Nims at the latter part of 2019 in 2 the beginning of 2020. Subsequently we have not seen him since that time in the interim he did have evaluation with vein and vascular specialist specifically Dr. Anice Paganini who did perform quite extensive work for a left femoral to anterior tibial artery bypass. With that being said in the interim the patient has developed significant lymphedema and has wounds that he tells me have really never healed in regard to the incision site on the left leg. He also has multiple wounds on the feet for various reasons some of which is that he tends to pick at his feet.  Fortunately there is no signs of active infection systemically at this time he does have some wounds that are little bit deeper but most are fairly superficial he seems to have good blood flow and overall everything appears to be healthy I see no bone exposed and no obvious signs of osteomyelitis. I do not know that he necessarily needs a x-ray at this point although that something we could consider depending on how things progress. The patient does have a history of lymphedema, diabetes, this is type II, chronic kidney disease stage III, hypertension, and history of peripheral vascular disease. 07/05/2020; patient admitted last week. Is a patient I remember from 2019 he had a spreading infection involving the left foot and we sent him to the hospital. 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 He subsequently had a left femoral to anterior tibial bypass by Dr.Cain vein and vascular. He also has severe lymphedema with chronic skin changes related to that on the left leg. The most problematic area that was new today was on the left medial great toe. This was apparently a small area last week there was purulent drainage which our  intake nurse cultured. Also areas on the left medial foot and heel left lateral foot. He has 2 areas on the left medial calf left lateral calf in the setting of the severe lymphedema. 07/13/2020 on evaluation today patient appears to be doing better in my opinion compared to his last visit. The good news is there is no signs of active infection systemically and locally I do not see any signs of infection either. He did have an x-ray which was negative that is great news he had a culture which showed MRSA but at the same time he is been on the doxycycline which has helped. I do think we may want to extend this for 7 additional days 1/25; patient admitted to the clinic a few weeks ago. 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 We have been putting him under compression his edema control is a lot better but he is severe verricused skin on the left leg. He is really done quite well he still has an open area on the left medial calf and the left medial first metatarsal head. We have been using silver collagen on the leg silver alginate on the foot 07/27/2020 upon evaluation today patient appears to be doing decently well in regard to his wounds. He still has a lot of dry skin on the left leg. Some of this is starting to peel back and I think he may be able to have them out by removing some that today. Fortunately there is no signs of active infection at this time on the left leg although on the right leg he does appear to have swelling and erythema as well as some mild warmth to touch. This does have been concerned about the possibility of cellulitis although within the differential diagnosis I do think that potentially a DVT has to be at least considered. We need to rule that out before proceeding would just call in the cellulitis. Especially since he is having pain in the posterior aspect of his calf muscle. 2/8; the patient had seen sparingly. He has severe skin changes of chronic lymphedema in the left leg thickened hyperkeratotic verrucous skin. He has an open wound on the medial part of the left first met head left mid tibia. He also has a rim of nonepithelialized skin in the anterior mid tibia. He brought in the AmLactin lotion that was been prescribed although I am not sure under compression and its utility. There concern about cellulitis on the right lower leg the last time he was here. He was put on on antibiotics. His DVT rule out was negative. The right leg looks fine he is using his stocking on this area 08/10/2020 upon evaluation today patient appears to be doing well with regard to his leg currently. He has been tolerating the dressing changes without complication. Fortunately there is  no signs of active infection which is great news. Overall very pleased with where things stand. 2/22; the patient still has an area on the medial part of the left first met his head. This looks better than when I last saw this earlier this month he has a rim of epithelialization but still some surface debris. Mostly everything on the left leg is healed. There is still a vulnerable in the left mid tibia area. 08/30/2020 upon evaluation today patient appears to be doing much better in regard to his wounds on his foot. Fortunately there does not appear to be any signs of active  infection systemically though locally we did culture this last week and it does appear that he does have MRSA currently. Nonetheless I think we will address that today I Minna send in a prescription for him in that regard. Overall though there does not appear to be any signs of significant worsening. 09/07/2020 on evaluation today patient's wounds over his left foot appear to be doing excellent. I do not see any signs of infection there is some callus buildup this can require debridement for certain but overall I feel like he is managing quite nicely. He still using the AmLactin cream which has been beneficial for him as well. 3/22; left foot wound is closed. There is no open area here. He is using ammonium lactate lotion to the lower extremities to help exfoliate dry cracked skin. He has compression stockings from elastic therapy in [place]. The wound on the medial part of his left first met head is healed today. READMISSION 04/12/2021 Mr. Bucks is a patient we know fairly well he had a prolonged stay in clinic in 2019 with wounds on his left lateral and left anterior lower extremity in the setting of chronic venous insufficiency. More recently he was here earlier this year with predominantly an area on his left foot first metatarsal head plantar and he says the plantar foot broke down on its not long after we discharged him but he  did not come back here. The last few months areas of broken down on his left anterior and again the left lateral lower extremity. The leg itself is very swollen chronically enlarged a lot of hyperkeratotic dry Berry Q skin in the left lower leg. His edema extends well into the thigh. He was seen by Dr. Donzetta Matters. He had ABIs on 03/02/2021 showing an ABI on the right of 1 with a TBI of 0.72 his ABI in the left at 1.09 TBI of 0.99. Monophasic and biphasic waveforms on the right. On the left monophasic waveforms were noted he went on to have an angiogram on 03/27/2021 this showed the aortic aortic and iliac segments were free of flow-limiting stenosis the left common femoral vein to evaluate the left femoral to anterior tibial artery bypass was unobstructed the bypass was patent without any areas of stenosis. We discharged the patient in bilateral juxta lite stockings but very clearly that was not sufficient to control the swelling and maintain skin integrity. He is clearly going to need compression pumps. 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 This is fortunate because he is on his feet for long periods of time. 10/27; patient comes in with our intake nurse reporting copious amount of green drainage from the left anterior mid tibia the left dorsal foot and to a lesser extent the left medial mid tibia. We left the compression wrap on all week for the amount of edema in his left leg is quite a bit better. We use silver alginate as the primary dressing 11/3; edema control is good. Left anterior lower leg left medial lower leg and the plantar first metatarsal head. The left anterior lower leg required debridement. Deep tissue culture I did of this wound showed MRSA I put him on 10 days of doxycycline which she will start today. We have him in compression wraps. He has a security card and [employer] however he is retiring on November 15. We will need to then get  him into a better offloading  boot for the left foot perhaps a total contact cast 11/10; edema control is quite good. Left anterior and left medial lower leg wounds in the setting of chronic venous insufficiency and lymphedema. He also has a substantial area over the left plantar first metatarsal head. 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 He has significant hypergranulation on the left plantar foot wound. The patient is a diabetic but he does not have significant PAD 11/17; edema control is quite good. Left anterior and left medial lower leg wounds look better. The really concerning area remains the area on the left plantar first metatarsal head. He has a rim of epithelialization. 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 Starting today with a forefoot off loader but . possibly a total contact cast. 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 Arrives with a very significant odor from I think all of the wounds on the left leg and the left foot. Because of this I did not put a total contact cast on him today but will could still consider this. His wife was having cataract surgery which is the reason he missed the appointment 12/6. I saw this man 5 days ago with a swelling below the popliteal fossa. I thought he actually might have a Baker's cyst however the DVT rule out study that we could arrange right away was negative the technician told me this was not a ruptured Baker's cyst. We attempted to get this aspirated by under ultrasound guidance in interventional radiology however all they did was an ultrasound however it shows an extensive fluid collection 62 x 8 x 9.4 in the left thigh  and left calf. The patient states he thinks this started 8 days ago or so but he really is not complaining of any pain, fever or systemic symptoms. He has not ha 12/20; after some difficulty I managed to get the patient into see Dr. Donzetta Matters. Eventually he was taken into the hospital and had a drain put in the fluid collection below his left knee posteriorly extending into the posterior thigh. He still has the drain in place. Culture of this showed moderate staff aureus few Morganella and few Klebsiella he is now on doxycycline and ciprofloxacin as suggested by infectious disease he is on this for a month. The drain will remain in place until it stops draining 12/29; he comes in today with the 1 wound on his left leg and the area on the left plantar first met head significantly smaller. Both look healthy. He still has the drain in the left leg. He says he has to change this daily. Follows up with Dr. Donzetta Matters on January 11. 06/29/2021; the wounds that I am following on the left leg and left first met head continued to be quite healthy. However the area where his inferior drain is in place had copious amounts of drainage which was green in color. The wound here is larger. Follows up with Dr. Gwenlyn Saran of vein and vascular his surgeon next week as well as infectious disease. He remains on ciprofloxacin and doxycycline. He is not complaining of excessive pain in either one of the drain areas  1/12; the patient saw vascular surgery and infectious disease. Vascular surgery has left the drain in place as there was still some notable drainage still see him back in 2 weeks. Dr. Velna Ochs stop the doxycycline and ciprofloxacin and I do not believe he follows up with them at this point. Culture I did last week showed both doxycycline resistant MRSA and Pseudomonas not sensitive to ciprofloxacin although only in rare titers 1/19; the patient's wound on the left anterior lower leg is just about healed. We have continued healing of  the area that was medially on the left leg. Left first plantar metatarsal head continues to get smaller. The major problem here is his 2 drain sites 1 on the left upper calf and lateral thigh. There is purulent drainage still from the left lateral thigh. I gave him antibiotics last week but we still have recultured. He has the drain in the area I think this is eventually going to have to come out. I suspect there will be a connecting wound to heal here perhaps with improved VAc 1/26; the patient had his drain removed by vein and vascular on 1/25/. This was a large pocket of fluid in his left thigh that seem to tunnel into his left upper calf. 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 His mention his Penrose drain was removed today. He now has a tunneling wound on his left calf and left thigh. Both of these probe widely towards each other although I cannot really prove that they connect. Both wounds on his lower leg anteriorly are closed and his area over the first metatarsal head on his right foot continues to improve. We are using Hydrofera Blue here. He also saw infectious disease culture of the abscess they noted was polymicrobial with MRSA, Morganella and Klebsiella he was treated with doxycycline and ciprofloxacin for 4 weeks ending on 07/03/2021. They did not recommend any further antibiotics. Notable that while he still had the Penrose drain in place last week he had purulent drainage coming out of the inferior IandD site this grew [facility], MRSA and Pseudomonas but there does not appear to be any active infection in this area today with the drain out and he is not systemically unwell 2/2; with regards to the drain sites the superior one on the thigh actually is closed down the one on the upper left lateral calf measures about 8 and half centimeters which is an improvement seems to be less prominent although still with a lot of drainage. The only remaining wound is over  the first metatarsal head on the left foot and this looks to be continuing to improve with Hydrofera Blue. 2/9; the area on his plantar left foot continues to contract. Callus around the wound edge. The drain sites specifically have not come down in depth. We put the wound VAC on Monday he changed the canister late last night our intake nurse reported a pocket of fluid perhaps caused by our compression wraps 2/16; continued improvement in left foot plantar wound. drainage site in the calf is not improved in terms of depth (wound vac) 2/23; continued improvement in the left foot wound over the first metatarsal head. With regards to the drain sites the area on his thigh laterally is healed however the open area on his calf is small in terms of circumference by still probes in by about 15 cm. Within using the wound VAC. Hydrofera Blue on his foot 08/24/2021: The left first metatarsal head wound  continues to improve. The wound bed is healthy with just some surrounding callus. Unfortunately the open drain site on his calf remains open and tunnels at least 15 cm (the extent of a Q-tip). This is despite several weeks of wound VAC treatment. Based on reading back through the notes, there has been really no significant change in the depth of the wound, although the orifice is smaller and the more cranial wound on his thigh has closed. I suspect the tunnel tracks nearly all the way to this location. 08/31/2021: Continued improvement in the left first metatarsal head wound. There has been absolutely no improvement to the long tunnel from his open drain site on his calf. We have tried to get him into see vascular surgery sooner to consider the possibility of simply filleting the tract open and allowing it to heal from the bottom up, likely with a wound VAC. They have not yet scheduled a sooner appointment than his current mid April 09/14/2021: He was seen by vascular surgery and they took him to the operating room  last week. They opened a portion of the tunnel, but did not extend the entire length of the known open subcutaneous tract. I read Dr. Claretha Cooper operative note and it is not clear from that documentation why only a portion of the tract was opened. The heaped up granulation tissue was curetted and removed from at least some portion of the tract. They did place a wound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

## 2022-03-29 NOTE — Progress Notes (Signed)
[name] ([id number]) Visit Report for 03/29/2022 Arrival Information Details Patient Name: Date of Service: [name] 03/29/2022 10:30 A M Medical Record Number: [id number] Patient Account Number: [id number] Date of Birth/Sex: Treating RN: 08/23/1950 (71 y.o. [name] Primary Care Trevor Wilkie: [name] Other Clinician: Referring [name] [name]: Treating Shaela Boer/Extender: [name] in Treatment: 20 Visit Information History Since Last Visit Added or deleted any medications: No Patient Arrived: Ambulatory Any new allergies or adverse reactions: No Arrival Time: 10:53 Had a fall or experienced change in No Accompanied By: self activities of daily living that may affect Transfer Assistance: None risk of falls: Patient Identification Verified: Yes Signs or symptoms of abuse/neglect since last visito No Secondary Verification Process Completed: Yes Hospitalized since last visit: No Patient Requires Transmission-Based Precautions: No Implantable device outside of the clinic excluding No Patient Has Alerts: Yes cellular tissue based products placed in the center since last visit: Has Dressing in Place as Prescribed: Yes Has Footwear/Offloading in Place as Prescribed: Yes Left: T Contact Cast otal Pain Present Now: No Electronic Signature(s) Signed: 03/29/2022 6:18:14 PM By: Baruch Gouty RN, BSN Entered By: Baruch Gouty on 03/29/2022 10:54:03 -------------------------------------------------------------------------------- Encounter Discharge Information Details Patient Name: Date of Service: [name]. 03/29/2022 10:30 A M Medical Record Number: [id number] Patient Account Number: [id number] Date of Birth/Sex: Treating RN: 03-27-1951 (71 y.o. [name] Primary Care Antonietta Lansdowne: [name] Other Clinician: Referring Christian Borgerding: Treating Kimela Malstrom/Extender: [name] in Treatment: 5 Encounter  Discharge Information Items Post Procedure Vitals Discharge Condition: Stable Temperature (F): 98.4 Ambulatory Status: Ambulatory Pulse (bpm): 62 Discharge Destination: Home Respiratory Rate (breaths/min): 18 Transportation: Private Auto Blood Pressure (mmHg): 120/64 Accompanied By: self Schedule Follow-up Appointment: Yes Clinical Summary of Care: Patient Declined Electronic Signature(s) Signed: 03/29/2022 6:18:14 PM By: Baruch Gouty RN, BSN Entered By: Baruch Gouty on 03/29/2022 17:04:44 -------------------------------------------------------------------------------- Lower Extremity Assessment Details Patient Name: Date of Service: [name]. 03/29/2022 10:30 A M Medical Record Number: [id number] Patient Account Number: [id number] Date of Birth/Sex: Treating RN: 1950/11/04 (71 y.o. [name] Primary Care Fahim Kats: [name] Other Clinician: Referring Darell Saputo: Treating Laini Urick/Extender: [name] in Treatment: 50 Edema Assessment Assessed: [Left: No] [Right: No] Edema: [Left: Ye] [Right: s] Calf Left: Right: Point of Measurement: 41 cm From Medial Instep 45.3 cm Ankle Left: Right: Point of Measurement: 10 cm From Medial Instep 29.3 cm Vascular Assessment Pulses: Dorsalis Pedis Palpable: [Left:Yes] Electronic Signature(s) Signed: 03/29/2022 6:18:14 PM By: Baruch Gouty RN, BSN Entered By: Baruch Gouty on 03/29/2022 11:11:49 -------------------------------------------------------------------------------- Multi Wound Chart Details Patient Name: Date of Service: [name]. 03/29/2022 10:30 A M Medical Record Number: [id number] Patient Account Number: [id number] Date of Birth/Sex: Treating RN: 1951-05-03 (71 y.o. M) Primary Care Woodson Macha: [name] Other Clinician: Referring Delshon Blanchfield: Treating Shailen Thielen/Extender: [name] in Treatment: 49 Vital Signs Height(in): 74 Capillary  Blood Glucose(mg/dl): 241 Weight(lbs): 238 Pulse(bpm): 70 Body Mass Index(BMI): 30.6 Blood Pressure(mmHg): 120/63 Temperature(F): 98.4 Respiratory Rate(breaths/min): 18 Photos: [22:Left, Lateral Lower Leg] [27:Left, Medial Foot] [N/A:N/A N/A] Wound Location: [22:Bump] [27:Blister] [N/A:N/A] Wounding Event: [22:Cyst] [27:Diabetic Wound/Ulcer of the Lower] [N/A:N/A] Primary Etiology: [22:Glaucoma, Sleep Apnea,] [27:Extremity Glaucoma, Sleep Apnea,] [N/A:N/A] Comorbid History: [22:Hypertension, Peripheral Arterial Disease, Peripheral Venous Disease, Disease, Peripheral Venous Disease, Type II Diabetes, Gout, Osteoarthritis, Type II Diabetes, Gout, Osteoarthritis, Neuropathy 06/03/2021] [27:Hypertension,  Peripheral Arterial Neuropathy 02/01/2022] [N/A:N/A] Date Acquired: [22:42] [27:8] [N/A:N/A] Weeks of Treatment: [22:Open] [27:Open] [N/A:N/A] Wound  Status: [22:No] [27:No] [N/A:N/A] Wound Recurrence: [22:Yes] [27:No] [N/A:N/A] Clustered Wound: [22:3] [27:N/A] [N/A:N/A] Clustered Quantity: [22:5.4x1.3x0.1] [27:1.3x2x0.1] [N/A:N/A] Measurements L x W x D (cm) [86:7.619] [27:2.042] [N/A:N/A] A (cm) : rea [22:0.551] [50:9.326] [N/A:N/A] Volume (cm) : [22:-234.30%] [27:-100.00%] [N/A:N/A] % Reduction in A [22:rea: 58.20%] [27:-100.00%] [N/A:N/A] % Reduction in Volume: [22:Full Thickness With Exposed Support Grade 1] [N/A:N/A] Classification: [22:Structures Medium] [27:Medium] [N/A:N/A] Exudate A mount: [22:Serosanguineous] [27:Serosanguineous] [N/A:N/A] Exudate Type: [22:red, brown] [27:red, brown] [N/A:N/A] Exudate Color: [22:Fibrotic scar, thickened scar] [27:Distinct, outline attached] [N/A:N/A] Wound Margin: [22:Large (67-100%)] [27:Large (67-100%)] [N/A:N/A] Granulation A mount: [22:Red, Pale] [27:Red] [N/A:N/A] Granulation Quality: [22:Small (1-33%)] [27:None Present (0%)] [N/A:N/A] Necrotic A mount: [22:Fat Layer (Subcutaneous Tissue): Yes Fat Layer (Subcutaneous Tissue): Yes  N/A] Exposed Structures: [22:Fascia: No Tendon: No Muscle: No Joint: No Bone: No Small (1-33%)] [27:Fascia: No Tendon: No Muscle: No Joint: No Bone: No Small (1-33%)] [N/A:N/A] Epithelialization: [22:Debridement - Selective/Open Wound Debridement - Selective/Open Wound N/A] Debridement: Pre-procedure Verification/Time Out 11:20 [27:11:20] [N/A:N/A] Taken: [22:Necrotic/Eschar, Slough] [27:Callus] [N/A:N/A] Tissue Debrided: [22:Non-Viable Tissue] [27:Skin/Epidermis] [N/A:N/A] Level: [22:6.5] [27:9] [N/A:N/A] Debridement A (sq cm): [22:rea Curette] [27:Curette] [N/A:N/A] Instrument: [22:Minimum] [27:Minimum] [N/A:N/A] Bleeding: [22:Pressure] [27:Pressure] [N/A:N/A] Hemostasis A chieved: [22:0] [27:0] [N/A:N/A] Procedural Pain: [22:0] [27:0] [N/A:N/A] Post Procedural Pain: [22:Procedure was tolerated well] [27:Procedure was tolerated well] [N/A:N/A] Debridement Treatment Response: [22:5.4x1.3x0.1] [27:1.3x2x0.1] [N/A:N/A] Post Debridement Measurements L x W x D (cm) [22:0.551] [71:2.458] [N/A:N/A] Post Debridement Volume: (cm) [22:Scarring: Yes] [27:Callus: Yes] [N/A:N/A] Periwound Skin Texture: [22:Dry/Scaly: Yes] [27:Maceration: Yes] [N/A:N/A] Periwound Skin Moisture: [22:No Abnormalities Noted] [27:No Abnormalities Noted] [N/A:N/A] Periwound Skin Color: [22:No Abnormality] [27:No Abnormality] [N/A:N/A] Temperature: [22:Debridement] [27:Debridement] [N/A:N/A] Treatment Notes Electronic Signature(s) Signed: 03/29/2022 11:57:07 AM By: Fredirick Maudlin MD FACS Entered By: Fredirick Maudlin on 03/29/2022 11:57:06 -------------------------------------------------------------------------------- Multi-Disciplinary Care Plan Details Patient Name: Date of Service: [name]. 03/29/2022 10:30 A M Medical Record Number: [id number] Patient Account Number: [id number] Date of Birth/Sex: Treating RN: August 07, 1950 (71 y.o. [name] Primary Care Provider: [name] Other  Clinician: Referring Provider: Treating Provider/Extender: [name] in Treatment: 82 Multidisciplinary Care Plan reviewed with physician Active Inactive Venous Leg Ulcer Nursing Diagnoses: Knowledge deficit related to disease process and management Potential for venous Insuffiency (use before diagnosis confirmed) Goals: Patient will maintain optimal edema control Date Initiated: 07/27/2021 Target Resolution Date: 04/12/2022 Goal Status: Active Interventions: Assess peripheral edema status every visit. Treatment Activities: Therapeutic compression applied : 07/27/2021 Notes: Wound/Skin Impairment Nursing Diagnoses: Impaired tissue integrity Knowledge deficit related to ulceration/compromised skin integrity Goals: Patient will have a decrease in wound volume by X% from date: (specify in notes) Date Initiated: 04/12/2021 Date Inactivated: 01/04/2022 Target Resolution Date: 04/23/2021 Goal Status: Met Patient/caregiver will verbalize understanding of skin care regimen Date Initiated: 01/04/2022 Target Resolution Date: 04/27/2022 Goal Status: Active Ulcer/skin breakdown will have a volume reduction of 30% by week 4 Date Initiated: 04/12/2021 Date Inactivated: 04/27/2021 Target Resolution Date: 04/27/2021 Goal Status: Unmet Unmet Reason: infection Ulcer/skin breakdown will have a volume reduction of 50% by week 8 Date Initiated: 04/27/2021 Date Inactivated: 06/29/2021 Target Resolution Date: 06/24/2021 Goal Status: Met Interventions: Assess patient/caregiver ability to obtain necessary supplies Assess patient/caregiver ability to perform ulcer/skin care regimen upon admission and as needed Assess ulceration(s) every visit Notes: Electronic Signature(s) Signed: 03/29/2022 6:18:14 PM By: Baruch Gouty RN, BSN Entered By: Baruch Gouty on 03/29/2022 11:18:14 -------------------------------------------------------------------------------- Pain Assessment  Details Patient Name: Date of Service: [name]. 03/29/2022 10:30 A M Medical Record Number: [id number]  Patient Account Number: [id number] Date of Birth/Sex: Treating RN: 1951-03-28 (71 y.o. [name] Primary Care Provider: [name] Other Clinician: Referring Provider: Treating Provider/Extender: [name] in Treatment: 50 Active Problems Location of Pain Severity and Description of Pain Patient Has Paino No Patient Has Paino No Site Locations Rate the pain. Current Pain Level: 0 Pain Management and Medication Current Pain Management: Electronic Signature(s) Signed: 03/29/2022 6:18:14 PM By: Baruch Gouty RN, BSN Entered By: Baruch Gouty on 03/29/2022 10:55:04 -------------------------------------------------------------------------------- Patient/Caregiver Education Details Patient Name: Date of Service: [name] 10/5/2023andnbsp10:30 A M Medical Record Number: [id number] Patient Account Number: [id number] Date of Birth/Gender: Treating RN: 11/29/1950 (71 y.o. [name] Primary Care Physician: [name] Other Clinician: Referring Physician: Treating Physician/Extender: [name] in Treatment: 22 Education Assessment Education Provided To: Patient Education Topics Provided Offloading: Methods: Explain/Verbal Responses: Reinforcements needed, State content correctly Wound/Skin Impairment: Methods: Explain/Verbal Responses: Reinforcements needed, State content correctly Electronic Signature(s) Signed: 03/29/2022 6:18:14 PM By: Baruch Gouty RN, BSN Entered By: Baruch Gouty on 03/29/2022 11:18:44 -------------------------------------------------------------------------------- Wound Assessment Details Patient Name: Date of Service: [name]. 03/29/2022 10:30 A M Medical Record Number: [id number] Patient Account Number: [id number] Date of Birth/Sex: Treating  RN: 10-09-1950 (71 y.o. [name] Primary Care Provider: [name] Other Clinician: Referring Provider: Treating Provider/Extender: [name] in Treatment: 57 Wound Status Wound Number: 22 Primary Cyst Etiology: Wound Location: Left, Lateral Lower Leg Wound Open Wounding Event: Bump Status: Date Acquired: 06/03/2021 Comorbid Glaucoma, Sleep Apnea, Hypertension, Peripheral Arterial Disease, Weeks Of Treatment: 42 History: Peripheral Venous Disease, Type II Diabetes, Gout, Osteoarthritis, Clustered Wound: Yes Neuropathy Photos Wound Measurements Length: (cm) 5 Width: (cm) 1 Depth: (cm) 0 Clustered Quantity: 3 Area: (cm) Volume: (cm) .4 % Reduction in Area: -234.3% .3 % Reduction in Volume: 58.2% .1 Epithelialization: Small (1-33%) Tunneling: No 5.513 Undermining: No 0.551 Wound Description Classification: Full Thickness With Exposed Support Structures Wound Margin: Fibrotic scar, thickened scar Exudate Amount: Medium Exudate Type: Serosanguineous Exudate Color: red, brown Foul Odor After Cleansing: No Slough/Fibrino Yes Wound Bed Granulation Amount: Large (67-100%) Exposed Structure Granulation Quality: Red, Pale Fascia Exposed: No Necrotic Amount: Small (1-33%) Fat Layer (Subcutaneous Tissue) Exposed: Yes Necrotic Quality: Adherent Slough Tendon Exposed: No Muscle Exposed: No Joint Exposed: No Bone Exposed: No Periwound Skin Texture Texture Color No Abnormalities Noted: No No Abnormalities Noted: Yes Scarring: Yes Temperature / Pain Temperature: No Abnormality Moisture No Abnormalities Noted: No Dry / Scaly: Yes Treatment Notes Wound #22 (Lower Leg) Wound Laterality: Left, Lateral Cleanser Soap and Water Discharge Instruction: May shower and wash wound with dial antibacterial soap and water prior to dressing change. Wound Cleanser Discharge Instruction: Cleanse the wound with wound cleanser prior to applying a  clean dressing using gauze sponges, not tissue or cotton balls. Peri-Wound Care Sween Lotion (Moisturizing lotion) Discharge Instruction: Apply moisturizing lotion to the leg Topical Primary Dressing KerraCel Ag Gelling Fiber Dressing, 4x5 in (silver alginate) Discharge Instruction: Apply silver alginate to wound bed as instructed Secondary Dressing ABD Pad, 8x10 Discharge Instruction: Apply over primary dressing as directed. Secured With Elastic Bandage 4 inch (ACE bandage) Discharge Instruction: Secure with ACE bandage as directed. Compression Wrap Compression Stockings Add-Ons Electronic Signature(s) Signed: 03/29/2022 6:18:14 PM By: Baruch Gouty RN, BSN Entered By: Baruch Gouty on 03/29/2022 11:16:26 -------------------------------------------------------------------------------- Wound Assessment Details Patient Name: Date of Service: [name]. 03/29/2022 10:30 A M Medical Record Number: [id number] Patient  Account Number: [id number] Date of Birth/Sex: Treating RN: 24-Feb-1951 (71 y.o. [name] Primary Care Deah Ottaway: [name] Other Clinician: Referring Bleu Moisan: Treating Kalayah Leske/Extender: [name] in Treatment: 19 Wound Status Wound Number: 27 Primary Diabetic Wound/Ulcer of the Lower Extremity Etiology: Wound Location: Left, Medial Foot Wound Open Wounding Event: Blister Status: Date Acquired: 02/01/2022 Comorbid Glaucoma, Sleep Apnea, Hypertension, Peripheral Arterial Disease, Weeks Of Treatment: 8 History: Peripheral Venous Disease, Type II Diabetes, Gout, Osteoarthritis, Clustered Wound: No Neuropathy Photos Wound Measurements Length: (cm) 1.3 Width: (cm) 2 Depth: (cm) 0.1 Area: (cm) 2.042 Volume: (cm) 0.204 Wound Description Classification: Grade 1 Wound Margin: Distinct, outline attached Exudate Amount: Medium Exudate Type: Serosanguineous Exudate Color: red, brown Foul Odor After Cleansing:  No Slough/Fibrino No % Reduction in Area: -100% % Reduction in Volume: -100% Epithelialization: Small (1-33%) Tunneling: No Undermining: No Wound Bed Granulation Amount: Large (67-100%) Exposed Structure Granulation Quality: Red Fascia Exposed: No Necrotic Amount: None Present (0%) Fat Layer (Subcutaneous Tissue) Exposed: Yes Tendon Exposed: No Muscle Exposed: No Joint Exposed: No Bone Exposed: No Periwound Skin Texture Texture Color No Abnormalities Noted: No No Abnormalities Noted: Yes Callus: Yes Temperature / Pain Temperature: No Abnormality Moisture No Abnormalities Noted: No Maceration: Yes Treatment Notes Wound #27 (Foot) Wound Laterality: Left, Medial Cleanser Peri-Wound Care Sween Lotion (Moisturizing lotion) Discharge Instruction: Apply moisturizing lotion as directed Topical Primary Dressing Hydrofera Blue Classic Foam, 4x4 in Discharge Instruction: Moisten with saline prior to applying to wound bed Secondary Dressing Optifoam Non-Adhesive Dressing, 4x4 in Discharge Instruction: Apply over primary dressing cut to make foam donut Woven Gauze Sponges 2x2 in Discharge Instruction: Apply over primary dressing as directed. Secured With 70M Medipore H Soft Cloth Surgical T ape, 4 x 10 (in/yd) Discharge Instruction: Secure with tape as directed. Compression Wrap Compression Stockings Add-Ons Electronic Signature(s) Signed: 03/29/2022 6:18:14 PM By: Baruch Gouty RN, BSN Entered By: Baruch Gouty on 03/29/2022 11:17:10 -------------------------------------------------------------------------------- Vitals Details Patient Name: Date of Service: [name]. 03/29/2022 10:30 A M Medical Record Number: [id number] Patient Account Number: [id number] Date of Birth/Sex: Treating RN: 1950/09/17 (70 y.o. [name] Primary Care Deroy Noah: [name] Other Clinician: Referring Gladyse Corvin: Treating Vance Hochmuth/Extender: [name] in Treatment: 50 Vital Signs Time Taken: 10:54 Temperature (F): 98.4 Height (in): 74 Pulse (bpm): 62 Weight (lbs): 238 Respiratory Rate (breaths/min): 18 Body Mass Index (BMI): 30.6 Blood Pressure (mmHg): 120/63 Capillary Blood Glucose (mg/dl): 241 Reference Range: 80 - 120 mg / dl Notes glucose per pt's meter at present Electronic Signature(s) Signed: 03/29/2022 6:18:14 PM By: Baruch Gouty RN, BSN Entered By: Baruch Gouty on 03/29/2022 10:54:48

## 2022-04-02 ENCOUNTER — Encounter (HOSPITAL_BASED_OUTPATIENT_CLINIC_OR_DEPARTMENT_OTHER): Payer: Medicare Other | Admitting: General Surgery

## 2022-04-02 DIAGNOSIS — I87322 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: Secondary | ICD-10-CM | POA: Diagnosis not present

## 2022-04-02 NOTE — Progress Notes (Signed)
JAHAAN, VANWAGNER ([id number]) [id number] Page 1 of 9 Visit Report for 04/02/2022 Arrival Information Details Patient Name: Date of Service: [place] 04/02/2022 8:30 A M Medical Record Number: [id number] Patient Account Number: [id number] Date of Birth/Sex: Treating RN: 09/04/1950 (71 y.o. [name] Primary Care Perle Brickhouse: [name] Other Clinician: Referring Casee Knepp: Treating Laniesha Das/Extender: [name] in Treatment: 44 Visit Information History Since Last Visit Added or deleted any medications: No Patient Arrived: Ambulatory Any new allergies or adverse reactions: No Arrival Time: 08:37 Had a fall or experienced change in No Accompanied By: self activities of daily living that may affect Transfer Assistance: None risk of falls: Patient Identification Verified: Yes Signs or symptoms of abuse/neglect since last visito No Secondary Verification Process Completed: Yes Hospitalized since last visit: No Patient Requires Transmission-Based Precautions: No Implantable device outside of the clinic excluding No Patient Has Alerts: Yes cellular tissue based products placed in the center since last visit: Has Dressing in Place as Prescribed: Yes Has Compression in Place as Prescribed: No Pain Present Now: No Electronic Signature(s) Signed: 04/02/2022 4:29:03 PM By: Baruch Gouty RN, BSN Entered By: Baruch Gouty on 04/02/2022 08:38:34 -------------------------------------------------------------------------------- Encounter Discharge Information Details Patient Name: Date of Service: [name] 04/02/2022 8:30 A M Medical Record Number: [id number] Patient Account Number: [id number] Date of Birth/Sex: Treating RN: 1950/09/13 (71 y.o. [name] Primary Care Cletis Clack: [name] Other Clinician: Referring Zeda Gangwer: Treating Raiyah Speakman/Extender: [name] in Treatment:  23 Encounter Discharge Information Items Discharge Condition: Stable Ambulatory Status: Ambulatory Discharge Destination: Home Transportation: Private Auto Accompanied By: self Schedule Follow-up Appointment: Yes Clinical Summary of Care: Patient Declined Electronic Signature(s) Signed: 04/02/2022 4:29:03 PM By: Baruch Gouty RN, BSN Entered By: Baruch Gouty on 04/02/2022 09:24:18 [name] ([id number]) [id number] Page 2 of 9 -------------------------------------------------------------------------------- Lower Extremity Assessment Details Patient Name: Date of Service: ILH[place], MADAN 04/02/2022 8:30 A M Medical Record Number: [id number] Patient Account Number: [id number] Date of Birth/Sex: Treating RN: 1951/03/05 (71 y.o. [name] Primary Care Maxum Cassarino: [name] Other Clinician: Referring Erven Ramson: Treating Jakim Drapeau/Extender: [name] in Treatment: 50 Edema Assessment Assessed: [Left: No] [Right: No] Edema: [Left: Ye] [Right: s] Calf Left: Right: Point of Measurement: 41 cm From Medial Instep 44 cm Ankle Left: Right: Point of Measurement: 10 cm From Medial Instep 28.5 cm Vascular Assessment Pulses: Dorsalis Pedis Palpable: [Left:Yes] Electronic Signature(s) Signed: 04/02/2022 4:29:03 PM By: Baruch Gouty RN, BSN Entered By: Baruch Gouty on 04/02/2022 08:45:29 -------------------------------------------------------------------------------- Multi Wound Chart Details Patient Name: Date of Service: [name] 04/02/2022 8:30 A M Medical Record Number: [id number] Patient Account Number: [id number] Date of Birth/Sex: Treating RN: [birth year] (71 y.o. M) Primary Care Vara Mairena: [name] Other Clinician: Referring Dhanvi Boesen: Treating Noah Lembke/Extender: [name] in Treatment: 65 Vital Signs Height(in): 74 Capillary Blood Glucose(mg/dl): 176 Weight(lbs):  238 Pulse(bpm): 42 Body Mass Index(BMI): 30.6 Blood Pressure(mmHg): 148/77 Temperature(F): 97.8 Respiratory Rate(breaths/min): 18 [22:Photos: No Photos Left, Lateral Lower Leg Wound Location: Bump Wounding Event: Cyst Primary Etiology: Glaucoma, Sleep Apnea, Comorbid History: Hypertension, Peripheral Arterial Disease, Peripheral Venous Disease, Disease, Peripheral Venous Disease, Type II  Diabetes, Gout, Osteoarthritis, Type II Diabetes, Gout, Osteoarthritis,] [27:No Photos Left, Medial Foot Blister Diabetic Wound/Ulcer of the Lower Extremity Glaucoma, Sleep Apnea, Hypertension, Peripheral Arterial] [N/A:N/A N/A N/A N/A N/A] BOB, DAVERSA ([id number]) [22:Neuropathy 06/03/2021 Date Acquired: 23 Weeks of Treatment: Open Wound Status: No Wound Recurrence: Yes Clustered Wound:  3 Clustered Quantity: 5.4x1.3x0.1 Measurements L x W x D (cm) 5.513 A (cm) : rea 0.551 Volume (cm) : -234.30% % Reduction in  Area: 58.20% % Reduction in Volume: Full Thickness With Exposed Support Grade 1 Classification: Structures Medium Exudate Amount: Serosanguineous Exudate Type: red, brown Exudate Color: Fibrotic scar, thickened scar Wound Margin: Medium (34-66%)  Granulation Amount: Red, Pale Granulation Quality: Medium (34-66%) Necrotic Amount: Fat Layer (Subcutaneous Tissue): Yes Fat Layer (Subcutaneous Tissue): Yes N/A Exposed Structures: Fascia: No Tendon: No Muscle: No Joint: No Bone: No Medium (34-66%)  Epithelialization: Scarring: Yes Periwound Skin Texture: Dry/Scaly: Yes Periwound Skin Moisture: No Abnormalities Noted Periwound Skin Color: No Abnormality Temperature: N/A Procedures Performed:] [27:Neuropathy 02/01/2022 8 Open No No N/A 1.1x1.9x0.1  1.641 0.164 -60.70% -60.80% Medium Serosanguineous red, brown Distinct, outline attached Large (67-100%) Red Small (1-33%) Fascia: No Tendon: No Muscle: No Joint: No Bone: No Small (1-33%) Callus: Yes Dry/Scaly: Yes Maceration: No No Abnormalities Noted  No Abnormality  T Contact Cast otal] [N/A:[id number] Page 3 of 9 N/A N/A N/A N/A N/A N/A N/A N/A N/A N/A N/A N/A N/A N/A N/A N/A N/A N/A N/A N/A N/A N/A N/A N/A N/A] Treatment Notes Wound #22 (Lower Leg) Wound Laterality: Left, Lateral Cleanser Soap and Water Discharge Instruction: May shower and wash wound with dial antibacterial soap and water prior to dressing change. Wound Cleanser Discharge Instruction: Cleanse the wound with wound cleanser prior to applying a clean dressing using gauze sponges, not tissue or cotton balls. Peri-Wound Care Sween Lotion (Moisturizing lotion) Discharge Instruction: Apply moisturizing lotion to the leg Topical Primary Dressing KerraCel Ag Gelling Fiber Dressing, 4x5 in (silver alginate) Discharge Instruction: Apply silver alginate to wound bed as instructed Secondary Dressing ABD Pad, 8x10 Discharge Instruction: Apply over primary dressing as directed. Secured With Elastic Bandage 4 inch (ACE bandage) Discharge Instruction: Secure with ACE bandage as directed. Compression Wrap Compression Stockings Add-Ons Wound #27 (Foot) Wound Laterality: Left, Medial Cleanser Peri-Wound Care Sween Lotion (Moisturizing lotion) Discharge Instruction: Apply moisturizing lotion as directed Topical Primary Dressing DECLIN, RAJAN ([id number]) [id number] Page 4 of 9 Hydrofera Blue Classic Foam, 4x4 in Discharge Instruction: Moisten with saline prior to applying to wound bed Secondary Dressing Optifoam Non-Adhesive Dressing, 4x4 in Discharge Instruction: Apply over primary dressing cut to make foam donut Woven Gauze Sponges 2x2 in Discharge Instruction: Apply over primary dressing as directed. Secured With 51M Medipore H Soft Cloth Surgical T ape, 4 x 10 (in/yd) Discharge Instruction: Secure with tape as directed. Compression Wrap Compression Stockings Add-Ons Electronic Signature(s) Signed: 04/02/2022 9:43:40 AM By:  Fredirick Maudlin MD FACS Entered By: Fredirick Maudlin on 04/02/2022 09:43:40 -------------------------------------------------------------------------------- Multi-Disciplinary Care Plan Details Patient Name: Date of Service: [name] 04/02/2022 8:30 A M Medical Record Number: [id number] Patient Account Number: [id number] Date of Birth/Sex: Treating RN: 1951-05-07 (71 y.o. [name] Primary Care Brenten Janney: [name] Other Clinician: Referring Kassim Guertin: Treating Kedra Mcglade/Extender: [name] in Treatment: 11 Multidisciplinary Care Plan reviewed with physician Active Inactive Venous Leg Ulcer Nursing Diagnoses: Knowledge deficit related to disease process and management Potential for venous Insuffiency (use before diagnosis confirmed) Goals: Patient will maintain optimal edema control Date Initiated: 07/27/2021 Target Resolution Date: 04/12/2022 Goal Status: Active Interventions: Assess peripheral edema status every visit. Treatment Activities: Therapeutic compression applied : 07/27/2021 Notes: Wound/Skin Impairment Nursing Diagnoses: Impaired tissue integrity Knowledge deficit related to ulceration/compromised skin integrity Goals: Patient will have a decrease in wound volume by X% from date: (specify in notes)  Date Initiated: 04/12/2021 Date Inactivated: 01/04/2022 Target Resolution Date: 04/23/2021 Goal Status: Met CLESTER, CHLEBOWSKI ([id number]) [id number] Page 5 of 9 Patient/caregiver will verbalize understanding of skin care regimen Date Initiated: 01/04/2022 Target Resolution Date: 04/27/2022 Goal Status: Active Ulcer/skin breakdown will have a volume reduction of 30% by week 4 Date Initiated: 04/12/2021 Date Inactivated: 04/27/2021 Target Resolution Date: 04/27/2021 Goal Status: Unmet Unmet Reason: infection Ulcer/skin breakdown will have a volume reduction of 50% by week 8 Date Initiated: 04/27/2021 Date  Inactivated: 06/29/2021 Target Resolution Date: 06/24/2021 Goal Status: Met Interventions: Assess patient/caregiver ability to obtain necessary supplies Assess patient/caregiver ability to perform ulcer/skin care regimen upon admission and as needed Assess ulceration(s) every visit Notes: Electronic Signature(s) Signed: 04/02/2022 4:29:03 PM By: Baruch Gouty RN, BSN Entered By: Baruch Gouty on 04/02/2022 09:22:44 -------------------------------------------------------------------------------- Pain Assessment Details Patient Name: Date of Service: [name] 04/02/2022 8:30 A M Medical Record Number: [id number] Patient Account Number: [id number] Date of Birth/Sex: Treating RN: 07-Apr-1951 (71 y.o. [name] Primary Care Kiaja Shorty: [name] Other Clinician: Referring Trinnity Breunig: Treating Shyloh Krinke/Extender: [name] in Treatment: 50 Active Problems Location of Pain Severity and Description of Pain Patient Has Paino No Site Locations Rate the pain. Current Pain Level: 0 Pain Management and Medication Current Pain Management: Electronic Signature(s) Signed: 04/02/2022 4:29:03 PM By: Baruch Gouty RN, BSN Entered By: Baruch Gouty on 04/02/2022 08:39:39 [name] ([id number]) [id number] Page 6 of 9 -------------------------------------------------------------------------------- Patient/Caregiver Education Details Patient Name: Date of Service: [place]AV[place], SENSING 10/9/2023andnbsp8:30 A M Medical Record Number: [id number] Patient Account Number: [id number] Date of Birth/Gender: Treating RN: 12-Aug-1950 (71 y.o. [name] Primary Care Physician: [name] Other Clinician: Referring Physician: Treating Physician/Extender: [name] in Treatment: 65 Education Assessment Education Provided To: Patient Education Topics Provided Offloading: Methods:  Explain/Verbal Responses: Reinforcements needed, State content correctly Wound/Skin Impairment: Methods: Explain/Verbal Responses: Reinforcements needed, State content correctly Electronic Signature(s) Signed: 04/02/2022 4:29:03 PM By: Baruch Gouty RN, BSN Entered By: Baruch Gouty on 04/02/2022 09:23:11 -------------------------------------------------------------------------------- Wound Assessment Details Patient Name: Date of Service: [name] 04/02/2022 8:30 A M Medical Record Number: [id number] Patient Account Number: [id number] Date of Birth/Sex: Treating RN: 03-07-1951 (71 y.o. [name] Primary Care Ty Buntrock: [name] Other Clinician: Referring Monzerat Handler: Treating Sparkles Mcneely/Extender: [name] in Treatment: 90 Wound Status Wound Number: 22 Primary Cyst Etiology: Wound Location: Left, Lateral Lower Leg Wound Open Wounding Event: Bump Status: Date Acquired: 06/03/2021 Comorbid Glaucoma, Sleep Apnea, Hypertension, Peripheral Arterial Disease, Weeks Of Treatment: 43 History: Peripheral Venous Disease, Type II Diabetes, Gout, Osteoarthritis, Clustered Wound: Yes Neuropathy Wound Measurements Length: (cm) Width: (cm) Depth: (cm) Clustered Quantity: Area: (cm) Volume: (cm) 5.4 % Reduction in Area: -234.3% 1.3 % Reduction in Volume: 58.2% 0.1 Epithelialization: Medium (34-66%) 3 Tunneling: No 5.513 Undermining: No 0.551 Wound Description Classification: Full Thickness With Exposed Support Structures Wound Margin: Fibrotic scar, thickened scar Exudate Amount: Medium Exudate Type: Serosanguineous IMRE, VECCHIONE ([id number]) Exudate Color: red, brown Foul Odor After Cleansing: No Slough/Fibrino Yes [id number] Page 7 of 9 Wound Bed Granulation Amount: Medium (34-66%) Exposed Structure Granulation Quality: Red, Pale Fascia Exposed: No Necrotic Amount: Medium (34-66%) Fat Layer  (Subcutaneous Tissue) Exposed: Yes Necrotic Quality: Adherent Slough Tendon Exposed: No Muscle Exposed: No Joint Exposed: No Bone Exposed: No Periwound Skin Texture Texture Color No Abnormalities Noted: No No Abnormalities Noted: Yes Scarring: Yes Temperature / Pain Temperature: No Abnormality Moisture No Abnormalities Noted: No  Dry / Scaly: Yes Treatment Notes Wound #22 (Lower Leg) Wound Laterality: Left, Lateral Cleanser Soap and Water Discharge Instruction: May shower and wash wound with dial antibacterial soap and water prior to dressing change. Wound Cleanser Discharge Instruction: Cleanse the wound with wound cleanser prior to applying a clean dressing using gauze sponges, not tissue or cotton balls. Peri-Wound Care Sween Lotion (Moisturizing lotion) Discharge Instruction: Apply moisturizing lotion to the leg Topical Primary Dressing KerraCel Ag Gelling Fiber Dressing, 4x5 in (silver alginate) Discharge Instruction: Apply silver alginate to wound bed as instructed Secondary Dressing ABD Pad, 8x10 Discharge Instruction: Apply over primary dressing as directed. Secured With Elastic Bandage 4 inch (ACE bandage) Discharge Instruction: Secure with ACE bandage as directed. Compression Wrap Compression Stockings Add-Ons Electronic Signature(s) Signed: 04/02/2022 4:29:03 PM By: Baruch Gouty RN, BSN Entered By: Baruch Gouty on 04/02/2022 08:53:14 -------------------------------------------------------------------------------- Wound Assessment Details Patient Name: Date of Service: [name] 04/02/2022 8:30 A M Medical Record Number: [id number] Patient Account Number: [id number] Date of Birth/Sex: Treating RN: 1950/08/05 (71 y.o. [name] Primary Care Rosabella Edgin: [name] Other Clinician: Referring Roselene Gray: Treating Ezri Landers/Extender: [name] in Treatment: [address] Arnette Norris ([id number])  [id number] Page 8 of 9 Wound Status Wound Number: 27 Primary Diabetic Wound/Ulcer of the Lower Extremity Etiology: Wound Location: Left, Medial Foot Wound Open Wounding Event: Blister Status: Date Acquired: 02/01/2022 Comorbid Glaucoma, Sleep Apnea, Hypertension, Peripheral Arterial Disease, Weeks Of Treatment: 8 History: Peripheral Venous Disease, Type II Diabetes, Gout, Osteoarthritis, Clustered Wound: No Neuropathy Wound Measurements Length: (cm) 1.1 Width: (cm) 1.9 Depth: (cm) 0.1 Area: (cm) 1.641 Volume: (cm) 0.164 % Reduction in Area: -60.7% % Reduction in Volume: -60.8% Epithelialization: Small (1-33%) Tunneling: No Undermining: No Wound Description Classification: Grade 1 Wound Margin: Distinct, outline attached Exudate Amount: Medium Exudate Type: Serosanguineous Exudate Color: red, brown Foul Odor After Cleansing: No Slough/Fibrino No Wound Bed Granulation Amount: Large (67-100%) Exposed Structure Granulation Quality: Red Fascia Exposed: No Necrotic Amount: Small (1-33%) Fat Layer (Subcutaneous Tissue) Exposed: Yes Necrotic Quality: Adherent Slough Tendon Exposed: No Muscle Exposed: No Joint Exposed: No Bone Exposed: No Periwound Skin Texture Texture Color No Abnormalities Noted: No No Abnormalities Noted: Yes Callus: Yes Temperature / Pain Temperature: No Abnormality Moisture No Abnormalities Noted: No Dry / Scaly: Yes Maceration: No Treatment Notes Wound #27 (Foot) Wound Laterality: Left, Medial Cleanser Peri-Wound Care Sween Lotion (Moisturizing lotion) Discharge Instruction: Apply moisturizing lotion as directed Topical Primary Dressing Hydrofera Blue Classic Foam, 4x4 in Discharge Instruction: Moisten with saline prior to applying to wound bed Secondary Dressing Optifoam Non-Adhesive Dressing, 4x4 in Discharge Instruction: Apply over primary dressing cut to make foam donut Woven Gauze Sponges 2x2  in Discharge Instruction: Apply over primary dressing as directed. Secured With 93M Medipore H Soft Cloth Surgical T ape, 4 x 10 (in/yd) Discharge Instruction: Secure with tape as directed. Compression Wrap Compression Stockings Add-Ons Electronic Signature(s) ENRIGUE, HASHIMI ([id number]) [id number] Page 9 of 9 Signed: 04/02/2022 4:29:03 PM By: Baruch Gouty RN, BSN Entered By: Baruch Gouty on 04/02/2022 08:53:48 -------------------------------------------------------------------------------- Vitals Details Patient Name: Date of Service: [name] 04/02/2022 8:30 A M Medical Record Number: [id number] Patient Account Number: [id number] Date of Birth/Sex: Treating RN: 02/15/1951 (71 y.o. [name] Primary Care Meilyn Heindl: [name] Other Clinician: Referring Shayan Bramhall: Treating [employer]/Extender: [name] in Treatment: 50 Vital Signs Time Taken: 08:38 Temperature (F): 97.8 Height (in): 74 Pulse (bpm): 66 Weight (lbs): 238 Respiratory Rate (breaths/min): 18  Body Mass Index (BMI): 30.6 Blood Pressure (mmHg): 148/77 Capillary Blood Glucose (mg/dl): 176 Reference Range: 80 - 120 mg / dl Notes glucose per pt report this am Electronic Signature(s) Signed: 04/02/2022 4:29:03 PM By: Baruch Gouty RN, BSN Entered By: Baruch Gouty on 04/02/2022 08:39:32

## 2022-04-02 NOTE — Progress Notes (Signed)
[name], [name] ([id number]) [id number] Page 1 of 17 Visit Report for 04/02/2022 Chief Complaint Document Details Patient Name: Date of Service: [name], [name] 04/02/2022 8:30 A M Medical Record Number: [id number] Patient Account Number: [id number] Date of Birth/Sex: Treating RN: [birth year] (71 y.o. M) Primary Care Provider: Jilda [name] Other Clinician: Referring Provider: Treating Provider/Extender: [name] in Treatment: 9 Information Obtained from: Patient Chief Complaint Left leg and foot ulcers 04/12/2021; patient is here for wounds on his left lower leg and left plantar foot over the first metatarsal head Electronic Signature(s) Signed: 04/02/2022 9:43:47 AM By: [name] Maudlin MD FACS Entered By: [name] on 04/02/2022 09:43:47 -------------------------------------------------------------------------------- HPI Details Patient Name: Date of Service: [name]. 04/02/2022 8:30 A M Medical Record Number: [id number] Patient Account Number: [id number] Date of Birth/Sex: Treating RN: May 26, 1951 (71 y.o. M) Primary Care Provider: Jilda [name] Other Clinician: Referring Provider: Treating Provider/Extender: [name] in Treatment: 53 History of Present Illness HPI Description: 10/11/17; [name] is a 71 year old man who tells me that in 2015 he slipped down the latter traumatizing his left leg. He developed a wound in the same spot the area that we are currently looking at. He states this closed over for the most part although he always felt it was somewhat unstable. In 2016 he hit the same area with the door of his car had this reopened. He tells me that this is never really closed although sometimes an inflow it remains open on a constant basis. He has not been using any specific dressing to this except for topical antibiotics the nature of which were not really sure. His primary doctor  did send him to see Dr. Einar [name] of interventional cardiology. He underwent an angiogram on 08/06/17 and he underwent a PTA and directional atherectomy of the lesser distal SFA and popliteal arteries which resulted in brisk improvement in blood flow. It was noted that he had 2 vessel runoff through the anterior tibial and peroneal. He is also been to see vascular and interventional radiologist. He was not felt to have any significant superficial venous insufficiency. Presumably is not a candidate for any ablation. It was suggested he come here for wound care. The patient is a type II diabetic on insulin. He also has a history of venous insufficiency. ABIs on the left were noncompressible in our clinic 10/21/17; patient we admitted to the clinic last week. 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 We put Iodosorb on him after an aggressive debridement and 3 layer compression. He complained of pain in his ankle and itching with is skin in fact he scratched the area on the medial calf superiorly at the rim of our wraps and he has 2 small open areas in that location today which are new. I changed his primary dressing today to silver collagen. As noted he is already had revascularization and does not have any significant superficial venous insufficiency that would be amenable to ablation 10/28/17; patient admitted to the clinic 2 weeks ago. He has a smaller Wound. Scratch injury from last week revealed. There is large wound over the tibial area. This is smaller. Granulation looks healthy. No need for debridement. 11/04/17; the wound on the left lateral calf looks better. Improved dimensions. Surface of this looks better. We've been maintaining him and Kerlix Coban wraps. He finds this much more comfortable. Silver collagen dressing 11/11/17; left lateral Wound  continues to look healthy be making progress. Using a #5 curet I removed removed nonviable  skin from the surface of the wound and then necrotic debris from the wound surface. Surface of the wound continues to look healthy. He also has an open area on the left great toenail bed. We've been using topical antibiotics. 11/19/17; left anterior lateral wound continues to look healthy but it's not closed. He also had a small wound above this on the left leg Initially traumatic wounds in the setting of significant chronic venous insufficiency and stasis dermatitis 11/25/17; left anterior wounds superiorly is closed still a small wound inferiorly. [name], [name] ([id number]) [id number] Page 2 of 17 12/02/17; left anterior tibial area. Arrives today with adherent callus. Post debridement clearly not completely closed. Hydrofera Blue under 3 layer compression. 12/09/17; left anterior tibia. Circumferential eschar however the wound bed looks stable to improved. We've been using Hydrofera Blue under 3 layer compression 12/17/17; left anterior tibia. Apparently this was felt to be closed however when the wrap was taken off there is a skin tear to reopen wounds in the same area we've been using Hydrofera Blue under 3 layer compression 12/23/17 left anterior tibia. Not close to close this week apparently the [facility] was stuck to this again. Still circumferential eschar requiring debridement. I put a contact layer on this this time under the Hydrofera Blue 12/31/17; left anterior tibia. Wound is better slight amount of hyper-granulation. Using Hydrofera Blue over Adaptic. 01/07/18; left anterior tibia. The wound had some surface eschar however after this was removed he has no open wound.he was already revascularized by Dr. Einar [name] when he came to our clinic with atherectomy of the left SFA and popliteal artery. He was also sent to interventional radiology for venous reflux studies. He was not felt to have significant reflux but certainly has chronic venous changes of his skin with  hemosiderin deposition around this area. He will definitely need to lubricate his skin and wear compression stocking and I've talked to him about this. READMISSION 05/26/2018 This is a now 71 year old man we cared for with traumatic wounds on his left anterior lower extremity. He had been previously revascularized during that admission by Dr. Einar [name]. Apparently in follow-up Dr. Einar [name] noted that he had deterioration in his arterial status. He underwent a stent placement in the distal left SFA on 04/22/2018. 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 He went back to the angiography suite on 04/30/2018 he underwent PTA and balloon angioplasty of the occluded left mid anterior tibial artery, thrombotic occlusion went from 100 to 0% which reconstitutes the posterior tibial artery. He had thrombectomy and aspiration of the peroneal artery. The stent placed in the distal SFA left SFA was still occluded. 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 He has been recuperating at [facility] in [facility] after the most recent hospitalization. He is going home tomorrow. He tells me that 2 weeks ago he traumatized the tip of his left fifth toe. He came in urgently for our review of this. This was a history of before I noted that Dr. Einar [name] had already noted dry gangrenous changes of the left fifth toe 06/09/2018; 2-week follow-up. I did contact Dr. Einar [name] after his last  appointment and he apparently saw 1 of Dr. Irven Shelling colleagues the next day. He does not follow-up with Dr. Einar [name] himself until Thursday of this week. 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 Nevertheless there is no evidence of infection no drainage and no pain.  He had a new area that this week when we were signing him in today on the left anterior mid tibia area, this is in close proximity to the previous wound we have dealt with in this clinic. 06/23/2018; 2-week follow-up. I did not receive a recent note from Dr. Einar [name] to review today. Our office is trying to obtain this. He is apparently not planning to do further vascular interventions and wondered about compression to try and help with the patient's chronic venous insufficiency. However we are also concerned about the arterial flow. He arrives in clinic today with a new area on the left third toe. The areas on the calf/anterior tibia are close to closing. The left fifth toe is still mummified using Betadine. -In reviewing things with the patient he has what sounds like claudication with mild to moderate amount of activity. 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 I prescribed Levaquin over the phone while we attempted to arrange a plan of care. However the patient called yesterday to report he had low-grade fever and he came in today acutely. There is been a marked deterioration in the left third toe with spreading cellulitis up into the dorsal left foot. He was referred to the emergency room. Readmission: 06/29/2020 patient presents today for reevaluation here in our clinic he was previously treated by Dr. Dellia Nims at the latter part of 2019 in 2 the beginning of 2020. Subsequently we have not seen him since that time in the interim he did have evaluation with vein and vascular specialist specifically Dr. Anice Paganini who did perform quite extensive work for a left femoral to anterior tibial artery bypass. With that being said in the interim the patient has developed significant lymphedema and has wounds that he tells me have really never healed in regard to the incision site on the left leg. He also has multiple wounds on the feet for various reasons some of which is  that he tends to pick at his feet. Fortunately there is no signs of active infection systemically at this time he does have some wounds that are little bit deeper but most are fairly superficial he seems to have good blood flow and overall everything appears to be healthy I see no bone exposed and no obvious signs of osteomyelitis. I do not know that he necessarily needs a x-ray at this point although that something we could consider depending on how things progress. The patient does have a history of lymphedema, diabetes, this is type II, chronic kidney disease stage III, hypertension, and history of peripheral vascular disease. 07/05/2020; patient admitted last week. Is a patient I remember from 2019 he had a spreading infection involving the left foot and we sent him to the hospital. 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 He subsequently had a left femoral to anterior tibial bypass by Dr.Cain vein and vascular. He also has severe lymphedema with chronic skin changes related to that on the left leg. The most problematic area that was new today was on the left medial great toe. This was apparently a small area last week there was purulent  drainage which our intake nurse cultured. Also areas on the left medial foot and heel left lateral foot. He has 2 areas on the left medial calf left lateral calf in the setting of the severe lymphedema. 07/13/2020 on evaluation today patient appears to be doing better in my opinion compared to his last visit. The good news is there is no signs of active infection systemically and locally I do not see any signs of infection either. He did have an x-ray which was negative that is great news he had a culture which showed MRSA but at the same time he is been on the doxycycline which has helped. I do think we may want to extend this for 7 additional days 1/25; patient admitted to the clinic a few weeks ago. 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 We have been putting him under compression his edema control is a lot better but he is severe verricused skin on the left leg. He is really done quite well he still has an open area on the left medial calf and the left medial first metatarsal head. We have been using silver collagen on the leg silver alginate on the foot 07/27/2020 upon evaluation today patient appears to be doing decently well in regard to his wounds. He still has a lot of dry skin on the left leg. Some of this is starting to peel back and I think he may be able to have them out by removing some that today. Fortunately there is no signs of active infection at this time on the left leg although on the right leg he does appear to have swelling and erythema as well as some mild warmth to touch. This does have been concerned about the possibility of cellulitis although within the differential diagnosis I do think that potentially a DVT has to be at least considered. We need to rule that out before proceeding would just call in the cellulitis. Especially since he is having pain in the posterior aspect of his calf muscle. 2/8; the patient had seen sparingly. He has severe skin changes of chronic lymphedema in the left leg thickened hyperkeratotic verrucous skin. He has an open wound on the medial part of the left first met head left mid tibia. He also has a rim of nonepithelialized skin in the anterior mid tibia. He brought in the AmLactin lotion that was been prescribed although I am not sure under compression and its utility. There concern about cellulitis on the right lower leg the last time he was here. He was put on on antibiotics. His DVT rule out was negative. The right leg looks fine he is using his stocking on this area 08/10/2020 upon evaluation today patient appears to be doing well with regard to his leg currently. He has been tolerating the dressing changes  without complication. Fortunately there is no signs of active infection which is great news. Overall very pleased with where things stand. 2/22; the patient still has an area on the medial part of the left first met his head. This looks better than when I last saw this earlier this month he has a rim of epithelialization but still some surface debris. Mostly everything on the left leg is healed. There is still a vulnerable in the left mid tibia area. 08/30/2020 upon evaluation today patient appears to be doing much better in regard to his wounds on his foot. Fortunately there does not appear to be any  signs of active infection systemically though locally we did culture this last week and it does appear that he does have MRSA currently. Nonetheless I think we will address that today I Minna send in a prescription for him in that regard. Overall though there does not appear to be any signs of significant worsening. 09/07/2020 on evaluation today patient's wounds over his left foot appear to be doing excellent. I do not see any signs of infection there is some callus buildup this can require debridement for certain but overall I feel like he is managing quite nicely. He still using the AmLactin cream which has been beneficial for him as well. [name], [name] ([id number]) [id number] Page 3 of 17 3/22; left foot wound is closed. There is no open area here. He is using ammonium lactate lotion to the lower extremities to help exfoliate dry cracked skin. He has compression stockings from elastic therapy in [place]. The wound on the medial part of his left first met head is healed today. READMISSION 04/12/2021 Mr. Viverette is a patient we know fairly well he had a prolonged stay in clinic in 2019 with wounds on his left lateral and left anterior lower extremity in the setting of chronic venous insufficiency. More recently he was here earlier this year with predominantly an area on his  left foot first metatarsal head plantar and he says the plantar foot broke down on its not long after we discharged him but he did not come back here. The last few months areas of broken down on his left anterior and again the left lateral lower extremity. The leg itself is very swollen chronically enlarged a lot of hyperkeratotic dry Berry Q skin in the left lower leg. His edema extends well into the thigh. He was seen by Dr. Donzetta Matters. He had ABIs on 03/02/2021 showing an ABI on the right of 1 with a TBI of 0.72 his ABI in the left at 1.09 TBI of 0.99. Monophasic and biphasic waveforms on the right. On the left monophasic waveforms were noted he went on to have an angiogram on 03/27/2021 this showed the aortic aortic and iliac segments were free of flow-limiting stenosis the left common femoral vein to evaluate the left femoral to anterior tibial artery bypass was unobstructed the bypass was patent without any areas of stenosis. We discharged the patient in bilateral juxta lite stockings but very clearly that was not sufficient to control the swelling and maintain skin integrity. He is clearly going to need compression pumps. 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 This is fortunate because he is on his feet for long periods of time. 10/27; patient comes in with our intake nurse reporting copious amount of green drainage from the left anterior mid tibia the left dorsal foot and to a lesser extent the left medial mid tibia. We left the compression wrap on all week for the amount of edema in his left leg is quite a bit better. We use silver alginate as the primary dressing 11/3; edema control is good. Left anterior lower leg left medial lower leg and the plantar first metatarsal head. The left anterior lower leg required debridement. Deep tissue culture I did of this wound showed MRSA I put him on 10 days of doxycycline which she will start today. We  have him in compression wraps. He has a security card and [employer] however he is retiring on November  15. We will need to then get him into a better offloading boot for the left foot perhaps a total contact cast 11/10; edema control is quite good. Left anterior and left medial lower leg wounds in the setting of chronic venous insufficiency and lymphedema. He also has a substantial area over the left plantar first metatarsal head. 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 He has significant hypergranulation on the left plantar foot wound. The patient is a diabetic but he does not have significant PAD 11/17; edema control is quite good. Left anterior and left medial lower leg wounds look better. The really concerning area remains the area on the left plantar first metatarsal head. He has a rim of epithelialization. 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 Starting today with a forefoot off loader but . possibly a total contact cast. 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 Arrives with a very significant odor from I think all of the wounds on the left leg and the left foot. Because of this I did not put a total contact cast on him today but will could still consider this. His wife was having cataract surgery which is the reason he missed the appointment 12/6. I saw this man 5 days ago with a swelling below the popliteal fossa. I thought he actually might have a Baker's cyst however the DVT rule out study that we could arrange right away was negative the technician told me this was not a ruptured Baker's cyst. We attempted to get this aspirated by under ultrasound guidance in interventional  radiology however all they did was an ultrasound however it shows an extensive fluid collection 62 x 8 x 9.4 in the left thigh and left calf. The patient states he thinks this started 8 days ago or so but he really is not complaining of any pain, fever or systemic symptoms. He has not ha 12/20; after some difficulty I managed to get the patient into see Dr. Donzetta Matters. Eventually he was taken into the hospital and had a drain put in the fluid collection below his left knee posteriorly extending into the posterior thigh. He still has the drain in place. Culture of this showed moderate staff aureus few Morganella and few Klebsiella he is now on doxycycline and ciprofloxacin as suggested by infectious disease he is on this for a month. The drain will remain in place until it stops draining 12/29; he comes in today with the 1 wound on his left leg and the area on the left plantar first met head significantly smaller. Both look healthy. He still has the drain in the left leg. He says he has to change this daily. Follows up with Dr. Donzetta Matters on January 11. 06/29/2021; the wounds that I am following on the left leg and left first met head continued to be quite healthy. However the area where his inferior drain is in place had copious amounts of drainage which was green in color. The wound here is larger. Follows up with Dr. Gwenlyn Saran of vein and vascular his surgeon next week as well as infectious disease. He remains on ciprofloxacin and doxycycline. He is  not complaining of excessive pain in either one of the drain areas 1/12; the patient saw vascular surgery and infectious disease. Vascular surgery has left the drain in place as there was still some notable drainage still see him back in 2 weeks. Dr. Velna Ochs stop the doxycycline and ciprofloxacin and I do not believe he follows up with them at this point. Culture I did last week showed both doxycycline resistant MRSA and Pseudomonas not sensitive to ciprofloxacin although  only in rare titers 1/19; the patient's wound on the left anterior lower leg is just about healed. We have continued healing of the area that was medially on the left leg. Left first plantar metatarsal head continues to get smaller. The major problem here is his 2 drain sites 1 on the left upper calf and lateral thigh. There is purulent drainage still from the left lateral thigh. I gave him antibiotics last week but we still have recultured. He has the drain in the area I think this is eventually going to have to come out. I suspect there will be a connecting wound to heal here perhaps with improved VAc 1/26; the patient had his drain removed by vein and vascular on 1/25/. This was a large pocket of fluid in his left thigh that seem to tunnel into his left upper calf. 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 His mention his Penrose drain was removed today. He now has a tunneling wound on his left calf and left thigh. Both of these probe widely towards each other although I cannot really prove that they connect. Both wounds on his lower leg anteriorly are closed and his area over the first metatarsal head on his right foot continues to improve. We are using Hydrofera Blue here. He also saw infectious disease culture of the abscess they noted was polymicrobial with MRSA, Morganella and Klebsiella he was treated with doxycycline and ciprofloxacin for 4 weeks ending on 07/03/2021. They did not recommend any further antibiotics. Notable that while he still had the Penrose drain in place last week he had purulent drainage coming out of the inferior IandD site this grew [facility], MRSA and Pseudomonas but there does not appear to be any active infection in this area today with the drain out and he is not systemically unwell 2/2; with regards to the drain sites the superior one on the thigh actually is closed down the one on the upper left lateral calf measures about 8 and  half centimeters which is an improvement seems to be less prominent although still with a lot of drainage. The only remaining wound is over the first metatarsal head on the left foot and this looks to be continuing to improve with Hydrofera Blue. 2/9; the area on his plantar left foot continues to contract. Callus around the wound edge. The drain sites specifically have not come down in depth. We put the wound VAC on Monday he changed the canister late last night our intake nurse reported a pocket of fluid perhaps caused by our compression wraps 2/16; continued improvement in left foot plantar wound. drainage site in the calf is not improved in terms of depth (wound vac) 2/23; continued improvement in the left foot wound over the first metatarsal head. With regards to the drain sites the area on his thigh laterally is healed GURJIT, [name] ([id number]) [id number] Page 4 of 17 however the open area on his calf is small in terms of circumference by still probes in  by about 15 cm. Within using the wound VAC. Hydrofera Blue on his foot 08/24/2021: The left first metatarsal head wound continues to improve. The wound bed is healthy with just some surrounding callus. Unfortunately the open drain site on his calf remains open and tunnels at least 15 cm (the extent of a Q-tip). This is despite several weeks of wound VAC treatment. Based on reading back through the notes, there has been really no significant change in the depth of the wound, although the orifice is smaller and the more cranial wound on his thigh has closed. I suspect the tunnel tracks nearly all the way to this location. 08/31/2021: Continued improvement in the left first metatarsal head wound. There has been absolutely no improvement to the long tunnel from his open drain site on his calf. We have tried to get him into see vascular surgery sooner to consider the possibility of simply filleting the tract open and  allowing it to heal from the bottom up, likely with a wound VAC. They have not yet scheduled a sooner appointment than his current mid April 09/14/2021: He was seen by vascular surgery and they took him to the operating room last week. They opened a portion of the tunnel, but did not extend the entire length of the known open subcutaneous tract. I read Dr. Claretha Cooper operative note and it is not clear from that documentation why only a portion of the tract was opened. The heaped up granulation tissue was curetted and removed from at least some portion of the tract. They did place a wound VAC and applied an Unna boot to the leg. The ulcer on his left first metatarsal head is smaller today. The bed looks good and there is just a small amount of surrounding callus. 09/21/2021: The ulcer on his left first metatarsal head looks to be stalled. There is some callus surrounding the wound but the wound bed itself does not appear particularly dynamic. The tunnel tract on his lateral left leg seems to be roughly the same length or perhaps slightly smaller but the wound bed appears healthy with good granulation tissue. He opened up a new wound on his medial thigh and the site of a prior surgical incision. He says that he did this unconsciously in his sleep by scratching. 09/28/2021: Unfortunately, the ulcer on his left first metatarsal head has extended underneath the callus toward the dorsum of his foot. The medial thigh wounds are roughly the same. The tunnel on his lateral left leg continues to be problematic; it is longer than we are able to actually probe with a Q-tip. I am still not certain as to why Dr. Donzetta Matters did not open this up entirely when he took the patient to the operating room. We will likely be back in the same situation with just a small superficial opening in a long unhealed tract, as the open portion is granulating in nicely. 10/02/2021: The patient was initially scheduled for a nurse visit, but we are  also applying a total contact cast today. The plantar foot wound looks clean without significant accumulated callus. We have been applying Prisma silver collagen to the site. 10/05/2021: The patient is here for his first total contact cast change. We have tried using gauze packing strips in the tunnel on his lateral leg wound, but this does not seem to be working any better than the white VAC foam. The foot ulcer looks about the same with minimal periwound callus. Medial thigh wound is clean with just  some overlying eschar. 10/12/2021: The plantar foot wound is stable without any significant accumulation of periwound callus. The surface is viable with good granulation tissue. The medial thigh wounds are much smaller and are epithelializing. On the other hand, he had purulent drainage coming from the tunnel on his lateral leg. He does go back to see Dr. Donzetta Matters next week and is planning to ask him why the wound tunnel was not completely opened at the time of his most recent operation. 10/19/2021: The plantar foot wound is markedly improved and has epithelial tissue coming through the surface. The medial thigh wounds are nearly closed with just a tiny open area. He did see Dr. Donzetta Matters earlier this week and apparently they did discuss the possibility of opening the sinus tract further and enabling a wound VAC application. Apparently there are some limits as to what Dr. 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 I think if we could get the tract open to the level of the popliteal fossa, this would greatly aid in her ability to get this chart closed. That being said, however, today when I probed the tract with a Q-tip, I was not able to insert the entirety of the Q-tip as I have on previous occasions. The tunnel is shorter by about 4 cm. The surface is clean with good granulation tissue and no further episodes of purulent drainage. 10/30/2021: Last week, the patient underwent  surgery and had the long tract in his leg opened. There was a rind that was debrided, according to the operative report. His medial thigh ulcers are closed. The plantar foot wound is clean with a good surface and some built up surrounding callus. 11/06/2021: The overall dimensions of the large wound on his lateral leg remain about the same, but there is good granulation tissue present and the tunneling is a little bit shorter. He has a new wound on his anterior tibial surface, in the same location where he had a similar lesion in the past. The plantar foot wound is clean with some buildup surrounding callus. Just toward the medial aspect of his foot, however, there is an area of darkening that once debrided, revealed another opening in the skin surface. 11/13/2021: The anterior tibial surface wound is closed. The plantar foot wound has some surrounding callus buildup. The area of darkening that I debrided last week and revealed an opening in the skin surface has closed again. The tunnel in the large wound on his lateral leg has come in by about 3 cm. There is healthy granulation tissue on the entire wound surface. 11/23/2021: The patient was out of town last week and did wet-to-dry dressings on his large wound. 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 Unfortunately, he picked open the wound on his left medial thigh. He says that it was itching and he just could not stop scratching it until it was open again. The wound on his plantar foot is smaller and has not accumulated a tremendous amount of callus. The lateral leg wound is shallower and the tunnel has also decreased in depth. There is just a little bit of slough accumulation on the surface. 11/30/2021: Another portion of his left medial thigh has been opened up. All of these wounds are fairly superficial with just a little bit of slough and eschar accumulation. The wound on his plantar  foot is almost closed with just a bit of eschar and  periwound callus accumulation. The lateral leg wound is nearly flush with the surrounding skin and the tunnel is markedly shallower. 12/07/2021: There is just 1 open area on his left medial thigh. It is clean with just a little bit of perimeter eschar. The wound on his plantar foot continues to contract and just has some eschar and periwound callus accumulation. The lateral leg wound is closing at the more distal aspect and the tunnel is smaller. The surface is nearly flush with the surrounding skin and it has a good bed of granulation tissue. 12/14/2021: The thigh and foot wounds are closed. The lateral leg wound has closed over approximately half of its length. The tunnel continues to contract and the surface is now flush with the surrounding skin. The wound bed has robust granulation tissue. 12/22/2021: The thigh and foot wounds have reopened. The foot wound has a lot of callus accumulation around and over it. The thigh wound is tiny with just a little bit of slough in the wound bed. The lateral leg wound continues to contract. His vascular surgeon took the wound VAC off earlier in the week and the patient has been doing wet-to-dry dressings. There is a little slough accumulation on the surface. The tunnel is about 3 cm in depth at this point. 12/28/2021: The thigh wound is closed again. The foot wound has some callus that subsequently has peeled back exposing just a small slit of a wound. The lateral leg wound Is down to about half the size that it originally was and the tunnel is down to about half a centimeter in depth. 01/04/2022: The thigh wound remains closed. The foot wound has heavy callus overlying the wound site. Once this was debrided, the wound was found to be closed. The lateral leg wound is smaller again this week and very superficial. No tunnel could be identified. 01/12/2022: The thigh and foot wounds both remain closed. The lateral leg  wound is now nearly flush with the skin surface. There is good granulation tissue present with a light layer of slough. 01/19/2022: Due to the way his wrap was placed, the patient did not change the dressing on his thigh at all and so the foam was saturated and his skin is macerated. There is a light layer of slough on the wound surface. The underlying granulation tissue is robust and healthy-appearing. He has heavy callus buildup at the site of his first metatarsal head wound which is still healed. 02/01/2022: He has been in silver alginate. When he removed the dressing from his thigh wound, however, some leg, superficially reopening a portion of the wound that had healed. In addition, underneath the callus at his left first metatarsal head, there appears to be a blister and the wound appears to be open again. [name], [name] ([id number]) [id number] Page 5 of 17 02/08/2022: The lateral leg wound has contracted substantially. There is eschar and a light layer of slough present. He says that it is starting to pull and is uncomfortable. On inspection, there is some puckering of the scar and the eschar is quite dry; this may account for his symptoms. On his first metatarsal head, the wound is much smaller with just some eschar on the surface. The callus has not reaccumulated. He reports that he had a blister come up on his medial thigh wound at the distal aspect. It popped and there is now an opening in his skin again. Looking back through his Roland of wound photos, there is what looks like a  permanent suture just deep to this location and it may be trying to erode through. We have been using silver alginate on his wounds. 02/15/2022: The lateral leg wound is about half the size it was last week. It is clean with just a little perimeter eschar and light slough. The wound on his first metatarsal head is about the same with heavy callus overlying it. The medial thigh wound is closed  again. He does have some skin changes on the top of his foot that looks potentially yeast related. 02/22/2022: The skin on the top of his foot improved with the use of a topical antifungal. The lateral leg wound continues to contract and is again smaller this week. There is a little bit of slough and eschar on the surface. The first metatarsal head wound is a little bit smaller but has reaccumulated a thick callus over the top. He decided to try to trim his toenail and ultimately took the entire nail off of his left great toe. 03/02/2022: His lateral leg wound continues to improve, as does the wound on his left great toe. Unfortunately, it appears that somehow his foot got wet and moisture seeped in through the opening causing his skin to lift. There is a large wound now overlying his first metatarsal on both the plantar, medial, and dorsal portion of his foot. There is necrotic tissue and slough present underneath the shaggy macerated skin. 03/08/2022: The lateral leg wound is smaller again today. There is just a light layer of slough and eschar on the surface. The great toe wound is smaller again today. The first metatarsal wound is a little bit smaller today and does not look nearly as necrotic and macerated. There is still slough and nonviable tissue present. 03/15/2022: The lateral leg wound is narrower and just has a little bit of light slough buildup. The first metatarsal wound still has a fair amount of moisture affecting the periwound skin. The great toe wound is healed. 03/22/2022: The lateral leg wound is now isolated to just at the level of his knee. There is some eschar and slough accumulation. The first metatarsal head wound has epithelialized tremendously and is about half the size that it was last week. He still has some maceration on the top of his foot and a fungal odor is present. 03/29/2022: T oday the patient's foot was macerated, suggesting that the cast got wet. The patient has also  been picking at his dry skin and has enlarged the wound on his left lateral leg. In the time between having his cast removed and my evaluation, he had picked more dry skin and opened up additional wounds on his Achilles area and dorsal foot. The plantar first metatarsal head wound, however, is smaller and clean with just macerated callus around the perimeter and light slough on the surface. The lateral leg wound measured a little bit larger but is also fairly clean with eschar and minimal slough. 04/02/2022: The patient had vascular studies done last Friday and so his cast was not applied. He is here today to have that done. 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 Electronic Signature(s) Signed: 04/02/2022 9:44:26 AM By: [name] Maudlin MD FACS Entered By: [name] on 04/02/2022 09:44:26 -------------------------------------------------------------------------------- Physical Exam Details Patient Name: Date of Service: [name]. 04/02/2022 8:30 A M Medical Record Number: [id number] Patient Account Number: [id number] Date of Birth/Sex: Treating RN: [birth year] (71 y.o. M) Primary Care Provider: Jilda [name] Other Clinician: Referring Provider: Treating Provider/Extender:  [name] in Treatment: 60 Constitutional Slightly hypertensive. . . . No acute distress.Marland Kitchen Respiratory Normal work of breathing on room air.. Notes 04/02/2022: The wounds are unchanged from Friday's visit. Electronic Signature(s) Signed: 04/02/2022 9:45:32 AM By: [name] Maudlin MD FACS Entered By: [name] on 04/02/2022 09:45:32 [name] ([id number]) [id number] Page 6 of 17 -------------------------------------------------------------------------------- Physician Orders Details Patient Name: Date of Service: [name], [name] 04/02/2022 8:30 A M Medical Record Number: [id number] Patient Account Number: [id number] Date of  Birth/Sex: Treating RN: 30-Jan-1951 (71 y.o. [name] Primary Care Provider: Jilda [name] Other Clinician: Referring Provider: Treating Provider/Extender: [name] in Treatment: 33 Verbal / Phone Orders: No Diagnosis Coding ICD-10 Coding Code Description 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 I89.0 Lymphedema, not elsewhere classified I87.322 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verity L97.528 Non-pressure chronic ulcer of other part of left foot with other specified severity Follow-up Appointments ppointment in 1 week. - Dr Celine Ahr - Room 1 TCC Return A Thursday 10/12 @ 1:15 pm Anesthetic Wound #22 Left,Lateral Lower Leg (In clinic) Topical Lidocaine 4% applied to wound bed Wound #27 Left,Medial Foot (In clinic) Topical Lidocaine 4% applied to wound bed Bathing/ Shower/ Hygiene May shower with protection but do not get wound dressing(s) wet. - Use a cast protector so you can shower without getting your cast) wet Edema Control - Lymphedema / SCD / Other Elevate legs to the level of the heart or above for 30 minutes daily and/or when sitting, a frequency of: - throughout the day Avoid standing for long periods of time. Patient to wear own compression stockings every day. - on right leg; Moisturize legs daily. - Ammonium lactate to right leg daily Compression stocking or Garment 20-30 mm/Hg pressure to: - left leg daily Off-Loading Total Contact Cast to Left Lower Extremity Other: - minimal weight bearing left foot Wound Treatment Wound #22 - Lower Leg Wound Laterality: Left, Lateral Cleanser: Soap and Water 3 x Per Week/30 Days Discharge Instructions: May shower and wash wound with dial antibacterial soap and water prior to dressing change. Cleanser: Wound Cleanser  3 x Per Week/30 Days Discharge Instructions: Cleanse the wound with wound cleanser prior to applying a clean dressing using gauze sponges, not tissue or cotton balls. Peri-Wound Care: Sween Lotion (Moisturizing lotion) 3 x Per Week/30 Days Discharge Instructions: Apply moisturizing lotion to the leg Prim Dressing: KerraCel Ag Gelling Fiber Dressing, 4x5 in (silver alginate) 3 x Per Week/30 Days ary Discharge Instructions: Apply silver alginate to wound bed as instructed Secondary Dressing: ABD Pad, 8x10 (Generic) 3 x Per Week/30 Days Discharge Instructions: Apply over primary dressing as directed. Secured With: Elastic Bandage 4 inch (ACE bandage) 3 x Per Week/30 Days Discharge Instructions: Secure with ACE bandage as directed. Wound #27 - Foot Wound Laterality: Left, Medial Peri-Wound Care: Sween Lotion (Moisturizing lotion) 1 x Per Week Discharge Instructions: Apply moisturizing lotion as directed Prim Dressing: Hydrofera Blue Classic Foam, 4x4 in 1 x Per Week ary Discharge Instructions: Moisten with saline prior to applying to wound bed Secondary Dressing: Optifoam Non-Adhesive Dressing, 4x4 in 1 x Per Week Discharge Instructions: Apply over primary dressing cut to make foam donut [name], [name] ([id number]) [id number] Page 7 of 17 Secondary Dressing: Woven Gauze Sponges 2x2 in 1 x Per Week Discharge Instructions: Apply over primary dressing as  directed. Secured With: 55M Medipore H Soft Cloth Surgical T ape, 4 x 10 (in/yd) 1 x Per Week Discharge Instructions: Secure with tape as directed. Electronic Signature(s) Signed: 04/02/2022 10:14:13 AM By: [name] Maudlin MD FACS Entered By: [name] on 04/02/2022 09:45:46 -------------------------------------------------------------------------------- Problem List Details Patient Name: Date of Service: [name]. 04/02/2022 8:30 A M Medical Record Number: [id number] Patient Account Number:  [id number] Date of Birth/Sex: Treating RN: Apr 07, 1951 (71 y.o. M) Primary Care Provider: Jilda [name] Other Clinician: Referring Provider: Treating Provider/Extender: [name] in Treatment: 19 Active Problems ICD-10 Encounter Code Description Active Date MDM Diagnosis 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 04/12/2021 No Yes I89.0 Lymphedema, not elsewhere classified 04/12/2021 No Yes I87.322 Chronic venous hypertension (idiopathic) with inflammation of left lower 04/12/2021 No Yes extremity L97.828 Non-pressure chronic ulcer of other part of left lower leg with other specified 04/12/2021 No Yes severity L97.528 Non-pressure chronic ulcer of other part of left foot with other specified 04/12/2021 No Yes severity Inactive Problems ICD-10 Code Description Active Date Inactive Date E11.621 Type 2 diabetes mellitus with foot ulcer 04/12/2021 04/12/2021 E11.42 Type 2 diabetes mellitus with diabetic polyneuropathy 04/12/2021 04/12/2021 L02.416 Cutaneous abscess of left lower limb 06/13/2021 06/13/2021 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 04/02/2022 8:30 A M Medical Record Number: [id number] Patient Account Number: [id number] Date of Birth/Sex: Treating RN: Sep 02, 1950 (71 y.o. M) Primary Care Provider: Jilda [name] Other Clinician: Referring Provider: Treating Provider/Extender: [name] in Treatment: 36 Subjective Chief Complaint Information  obtained from Patient Left leg and foot ulcers 04/12/2021; patient is here for wounds on his left lower leg and left plantar foot over the first metatarsal head History of Present Illness (HPI) 10/11/17; Mr. Vittorio is a 71 year old man who tells me that in 2015 he slipped down the latter traumatizing his left leg. He developed a wound in the same spot the area that we are currently looking at. He states this closed over for the most part although he always felt it was somewhat unstable. In 2016 he hit the same area with the door of his car had this reopened. He tells me that this is never really closed although sometimes an inflow it remains open on a constant basis. He has not been using any specific dressing to this except for topical antibiotics the nature of which were not really sure. His primary doctor did send him to see Dr. Einar [name] of interventional cardiology. He underwent an angiogram on 08/06/17 and he underwent a PTA and directional atherectomy of the lesser distal SFA and popliteal arteries which resulted in brisk improvement in blood flow. It was noted that he had 2 vessel runoff through the anterior tibial and peroneal. He is also been to see vascular and interventional radiologist. He was not felt to have any significant superficial venous insufficiency. Presumably is not a candidate for any ablation. It was suggested he come here for wound care. The patient is a type II diabetic on insulin. He also has a history of venous insufficiency. ABIs on the left were noncompressible in our clinic 10/21/17; patient we admitted to the clinic last week. He has a fairly large chronic  ulcer on the left lateral calf in the setting of chronic venous insufficiency. We put Iodosorb on him after an aggressive debridement and 3 layer compression. He complained of pain in his ankle and itching with is skin in fact he scratched the area on the medial calf superiorly at the rim of our wraps and he has 2 small  open areas in that location today which are new. I changed his primary dressing today to silver collagen. As noted he is already had revascularization and does not have any significant superficial venous insufficiency that would be amenable to ablation 10/28/17; patient admitted to the clinic 2 weeks ago. He has a smaller Wound. Scratch injury from last week revealed. There is large wound over the tibial area. This is smaller. Granulation looks healthy. No need for debridement. 11/04/17; the wound on the left lateral calf looks better. Improved dimensions. Surface of this looks better. We've been maintaining him and Kerlix Coban wraps. He finds this much more comfortable. Silver collagen dressing 11/11/17; left lateral Wound continues to look healthy be making progress. Using a #5 curet I removed removed nonviable skin from the surface of the wound and then necrotic debris from the wound surface. Surface of the wound continues to look healthy. ooHe also has an open area on the left great toenail bed. We've been using topical antibiotics. 11/19/17; left anterior lateral wound continues to look healthy but it's not closed. ooHe also had a small wound above this on the left leg ooInitially traumatic wounds in the setting of significant chronic venous insufficiency and stasis dermatitis 11/25/17; left anterior wounds superiorly is closed still a small wound inferiorly. 12/02/17; left anterior tibial area. Arrives today with adherent callus. Post debridement clearly not completely closed. Hydrofera Blue under 3 layer compression. 12/09/17; left anterior tibia. Circumferential eschar however the wound bed looks stable to improved. We've been using Hydrofera Blue under 3 layer compression 12/17/17; left anterior tibia. Apparently this was felt to be closed however when the wrap was taken off there is a skin tear to reopen wounds in the same area we've been using Hydrofera Blue under 3 layer compression 12/23/17  left anterior tibia. Not close to close this week apparently the [facility] was stuck to this again. Still circumferential eschar requiring debridement. I put a contact layer on this this time under the Hydrofera Blue 12/31/17; left anterior tibia. Wound is better slight amount of hyper-granulation. Using Hydrofera Blue over Adaptic. 01/07/18; left anterior tibia. The wound had some surface eschar however after this was removed he has no open wound.he was already revascularized by Dr. Einar [name] when he came to our clinic with atherectomy of the left SFA and popliteal artery. He was also sent to interventional radiology for venous reflux studies. He was not felt to have significant reflux but certainly has chronic venous changes of his skin with hemosiderin deposition around this area. He will definitely need to lubricate his skin and wear compression stocking and I've talked to him about this. READMISSION 05/26/2018 This is a now 71 year old man we cared for with traumatic wounds on his left anterior lower extremity. He had been previously revascularized during that admission by Dr. Einar [name]. Apparently in follow-up Dr. Einar [name] noted that he had deterioration in his arterial status. He underwent a stent placement in the distal left SFA on 04/22/2018. 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 He went back to the angiography suite on 04/30/2018 he underwent PTA and balloon angioplasty of the occluded left mid anterior  tibial artery, thrombotic occlusion went from 100 to 0% which reconstitutes the posterior tibial artery. He had thrombectomy and aspiration of the peroneal artery. The stent placed in the distal SFA left SFA was still occluded. 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 Noninvasive studies on 05/02/2018 showed an TBI on the left  at 0.43 and 0.82 on the right. NEVAEH, CASILLAS ([id number]) [id number] Page 9 of 17 He has been recuperating at [facility] after the most recent hospitalization. He is going home tomorrow. He tells me that 2 weeks ago he traumatized the tip of his left fifth toe. He came in urgently for our review of this. This was a history of before I noted that Dr. Einar [name] had already noted dry gangrenous changes of the left fifth toe 06/09/2018; 2-week follow-up. I did contact Dr. Einar [name] after his last appointment and he apparently saw 1 of Dr. Irven Shelling colleagues the next day. He does not follow-up with Dr. Einar [name] himself until Thursday of this week. 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 Nevertheless there is no evidence of infection no drainage and no pain. He had a new area that this week when we were signing him in today on the left anterior mid tibia area, this is in close proximity to the previous wound we have dealt with in this clinic. 06/23/2018; 2-week follow-up. I did not receive a recent note from Dr. Einar [name] to review today. Our office is trying to obtain this. He is apparently not planning to do further vascular interventions and wondered about compression to try and help with the patient's chronic venous insufficiency. However we are also concerned about the arterial flow. ooHe arrives in clinic today with a new area on the left third toe. The areas on the calf/anterior tibia are close to closing. The left fifth toe is still mummified using Betadine. -In reviewing things with the patient he has what sounds like claudication with mild to moderate amount of activity. 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 I prescribed Levaquin over the phone while we attempted to arrange a plan of care. However the patient called yesterday to report he had low-grade fever and he came in today acutely. There is  been a marked deterioration in the left third toe with spreading cellulitis up into the dorsal left foot. He was referred to the emergency room. Readmission: 06/29/2020 patient presents today for reevaluation here in our clinic he was previously treated by Dr. Dellia Nims at the latter part of 2019 in 2 the beginning of 2020. Subsequently we have not seen him since that time in the interim he did have evaluation with vein and vascular specialist specifically Dr. Anice Paganini who did perform quite extensive work for a left femoral to anterior tibial artery bypass. With that being said in the interim the patient has developed significant lymphedema and has wounds that he tells me have really never healed in regard to the incision site on the left leg. He also has multiple wounds on the feet for various reasons some of which is that he tends to pick at his feet. Fortunately there is no signs of active infection systemically at this time he does have some wounds that are little bit deeper but most are fairly  superficial he seems to have good blood flow and overall everything appears to be healthy I see no bone exposed and no obvious signs of osteomyelitis. I do not know that he necessarily needs a x-ray at this point although that something we could consider depending on how things progress. The patient does have a history of lymphedema, diabetes, this is type II, chronic kidney disease stage III, hypertension, and history of peripheral vascular disease. 07/05/2020; patient admitted last week. Is a patient I remember from 2019 he had a spreading infection involving the left foot and we sent him to the hospital. 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 He subsequently had a left femoral to anterior tibial bypass by Dr.Cain vein and vascular. He also has severe lymphedema with chronic skin changes related to that on the left leg. The most problematic area that was new today  was on the left medial great toe. This was apparently a small area last week there was purulent drainage which our intake nurse cultured. Also areas on the left medial foot and heel left lateral foot. He has 2 areas on the left medial calf left lateral calf in the setting of the severe lymphedema. 07/13/2020 on evaluation today patient appears to be doing better in my opinion compared to his last visit. The good news is there is no signs of active infection systemically and locally I do not see any signs of infection either. He did have an x-ray which was negative that is great news he had a culture which showed MRSA but at the same time he is been on the doxycycline which has helped. I do think we may want to extend this for 7 additional days 1/25; patient admitted to the clinic a few weeks ago. 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 We have been putting him under compression his edema control is a lot better but he is severe verricused skin on the left leg. He is really done quite well he still has an open area on the left medial calf and the left medial first metatarsal head. We have been using silver collagen on the leg silver alginate on the foot 07/27/2020 upon evaluation today patient appears to be doing decently well in regard to his wounds. He still has a lot of dry skin on the left leg. Some of this is starting to peel back and I think he may be able to have them out by removing some that today. Fortunately there is no signs of active infection at this time on the left leg although on the right leg he does appear to have swelling and erythema as well as some mild warmth to touch. This does have been concerned about the possibility of cellulitis although within the differential diagnosis I do think that potentially a DVT has to be at least considered. We need to rule that out before proceeding would just call in the cellulitis. Especially since he  is having pain in the posterior aspect of his calf muscle. 2/8; the patient had seen sparingly. He has severe skin changes of chronic lymphedema in the left leg thickened hyperkeratotic verrucous skin. He has an open wound on the medial part of the left first met head left mid tibia. He also has a rim of nonepithelialized skin in the anterior mid tibia. He brought in the AmLactin lotion that was been prescribed although I am not sure  under compression and its utility. There concern about cellulitis on the right lower leg the last time he was here. He was put on on antibiotics. His DVT rule out was negative. The right leg looks fine he is using his stocking on this area 08/10/2020 upon evaluation today patient appears to be doing well with regard to his leg currently. He has been tolerating the dressing changes without complication. Fortunately there is no signs of active infection which is great news. Overall very pleased with where things stand. 2/22; the patient still has an area on the medial part of the left first met his head. This looks better than when I last saw this earlier this month he has a rim of epithelialization but still some surface debris. Mostly everything on the left leg is healed. There is still a vulnerable in the left mid tibia area. 08/30/2020 upon evaluation today patient appears to be doing much better in regard to his wounds on his foot. 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 Nonetheless I think we will address that today I Minna send in a prescription for him in that regard. Overall though there does not appear to be any signs of significant worsening. 09/07/2020 on evaluation today patient's wounds over his left foot appear to be doing excellent. I do not see any signs of infection there is some callus buildup this can require debridement  for certain but overall I feel like he is managing quite nicely. He still using the AmLactin cream which has been beneficial for him as well. 3/22; left foot wound is closed. There is no open area here. He is using ammonium lactate lotion to the lower extremities to help exfoliate dry cracked skin. He has compression stockings from elastic therapy in [place]. The wound on the medial part of his left first met head is healed today. READMISSION 04/12/2021 Mr. Navis is a patient we know fairly well he had a prolonged stay in clinic in 2019 with wounds on his left lateral and left anterior lower extremity in the setting of chronic venous insufficiency. More recently he was here earlier this year with predominantly an area on his left foot first metatarsal head plantar and he says the plantar foot broke down on its not long after we discharged him but he did not come back here. The last few months areas of broken down on his left anterior and again the left lateral lower extremity. The leg itself is very swollen chronically enlarged a lot of hyperkeratotic dry Berry Q skin in the left lower leg. His edema extends well into the thigh. He was seen by Dr. Donzetta Matters. He had ABIs on 03/02/2021 showing an ABI on the right of 1 with a TBI of 0.72 his ABI in the left at 1.09 TBI of 0.99. Monophasic and biphasic waveforms on the right. On the left monophasic waveforms were noted he went on to have an angiogram on 03/27/2021 this showed the aortic aortic and iliac segments were free of flow-limiting stenosis the left common femoral vein to evaluate the left femoral to anterior tibial artery bypass was unobstructed the bypass was patent without any areas of stenosis. We discharged the patient in bilateral juxta lite stockings but very clearly that was not sufficient to control the swelling and maintain skin integrity. He is clearly going to need compression pumps. The patient  is a security guard at a [employer] but he is telling  me he is going to retire in 25 days. This is fortunate because he is on his feet for long periods of time. 10/27; patient comes in with our intake nurse reporting copious amount of green drainage from the left anterior mid tibia the left dorsal foot and to a lesser extent the left medial mid tibia. We left the compression wrap on all week for the amount of edema in his left leg is quite a bit better. We use silver alginate as KENGO, STURGES ([id number]) [id number] Page 10 of 17 the primary dressing 11/3; edema control is good. Left anterior lower leg left medial lower leg and the plantar first metatarsal head. The left anterior lower leg required debridement. Deep tissue culture I did of this wound showed MRSA I put him on 10 days of doxycycline which she will start today. We have him in compression wraps. He has a security card and [employer] however he is retiring on November 15. We will need to then get him into a better offloading boot for the left foot perhaps a total contact cast 11/10; edema control is quite good. Left anterior and left medial lower leg wounds in the setting of chronic venous insufficiency and lymphedema. He also has a substantial area over the left plantar first metatarsal head. 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 He has significant hypergranulation on the left plantar foot wound. The patient is a diabetic but he does not have significant PAD 11/17; edema control is quite good. Left anterior and left medial lower leg wounds look better. The really concerning area remains the area on the left plantar first metatarsal head. He has a rim of epithelialization. 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 Starting today with a forefoot off loader but . possibly  a total contact cast. 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 Arrives with a very significant odor from I think all of the wounds on the left leg and the left foot. Because of this I did not put a total contact cast on him today but will could still consider this. His wife was having cataract surgery which is the reason he missed the appointment 12/6. I saw this man 5 days ago with a swelling below the popliteal fossa. I thought he actually might have a Baker's cyst however the DVT rule out study that we could arrange right away was negative the technician told me this was not a ruptured Baker's cyst. We attempted to get this aspirated by under ultrasound guidance in interventional radiology however all they did was an ultrasound however it shows an extensive fluid collection 62 x 8 x 9.4 in the left thigh and left calf. The patient states he thinks this started 8 days ago or so but he really is not complaining of any pain, fever or systemic symptoms. He has not ha 12/20; after some difficulty I managed to get the patient into see Dr. Donzetta Matters. Eventually he was taken into the hospital and had a drain put in the fluid collection below his left knee posteriorly extending into  the posterior thigh. He still has the drain in place. Culture of this showed moderate staff aureus few Morganella and few Klebsiella he is now on doxycycline and ciprofloxacin as suggested by infectious disease he is on this for a month. The drain will remain in place until it stops draining 12/29; he comes in today with the 1 wound on his left leg and the area on the left plantar first met head significantly smaller. Both look healthy. He still has the drain in the left leg. He says he has to change this daily. Follows up with Dr. Donzetta Matters on January 11. 06/29/2021; the wounds that I am following on the left leg and  left first met head continued to be quite healthy. However the area where his inferior drain is in place had copious amounts of drainage which was green in color. The wound here is larger. Follows up with Dr. Gwenlyn Saran of vein and vascular his surgeon next week as well as infectious disease. He remains on ciprofloxacin and doxycycline. He is not complaining of excessive pain in either one of the drain areas 1/12; the patient saw vascular surgery and infectious disease. Vascular surgery has left the drain in place as there was still some notable drainage still see him back in 2 weeks. Dr. Velna Ochs stop the doxycycline and ciprofloxacin and I do not believe he follows up with them at this point. Culture I did last week showed both doxycycline resistant MRSA and Pseudomonas not sensitive to ciprofloxacin although only in rare titers 1/19; the patient's wound on the left anterior lower leg is just about healed. We have continued healing of the area that was medially on the left leg. Left first plantar metatarsal head continues to get smaller. The major problem here is his 2 drain sites 1 on the left upper calf and lateral thigh. There is purulent drainage still from the left lateral thigh. I gave him antibiotics last week but we still have recultured. He has the drain in the area I think this is eventually going to have to come out. I suspect there will be a connecting wound to heal here perhaps with improved VAc 1/26; the patient had his drain removed by vein and vascular on 1/25/. This was a large pocket of fluid in his left thigh that seem to tunnel into his left upper calf. 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 His mention his Penrose drain was removed today. He now has a tunneling wound on his left calf and left thigh. Both of these probe widely towards each other although I cannot really prove that they connect. Both wounds on his lower leg anteriorly are closed and his area over the  first metatarsal head on his right foot continues to improve. We are using Hydrofera Blue here. He also saw infectious disease culture of the abscess they noted was polymicrobial with MRSA, Morganella and Klebsiella he was treated with doxycycline and ciprofloxacin for 4 weeks ending on 07/03/2021. They did not recommend any further antibiotics. Notable that while he still had the Penrose drain in place last week he had purulent drainage coming out of the inferior IandD site this grew [facility], MRSA and Pseudomonas but there does not appear to be any active infection in this area today with the drain out and he is not systemically unwell 2/2; with regards to the drain sites the superior one on the thigh actually is closed down the one on the upper left lateral calf  measures about 8 and half centimeters which is an improvement seems to be less prominent although still with a lot of drainage. The only remaining wound is over the first metatarsal head on the left foot and this looks to be continuing to improve with Hydrofera Blue. 2/9; the area on his plantar left foot continues to contract. Callus around the wound edge. The drain sites specifically have not come down in depth. We put the wound VAC on Monday he changed the canister late last night our intake nurse reported a pocket of fluid perhaps caused by our compression wraps 2/16; continued improvement in left foot plantar wound. drainage site in the calf is not improved in terms of depth (wound vac) 2/23; continued improvement in the left foot wound over the first metatarsal head. With regards to the drain sites the area on his thigh laterally is healed however the open area on his calf is small in terms of circumference by still probes in by about 15 cm. Within using the wound VAC. Hydrofera Blue on his foot 08/24/2021: The left first metatarsal head wound continues to improve. The wound bed is healthy with just some surrounding callus.  Unfortunately the open drain site on his calf remains open and tunnels at least 15 cm (the extent of a Q-tip). This is despite several weeks of wound VAC treatment. Based on reading back through the notes, there has been really no significant change in the depth of the wound, although the orifice is smaller and the more cranial wound on his thigh has closed. I suspect the tunnel tracks nearly all the way to this location. 08/31/2021: Continued improvement in the left first metatarsal head wound. There has been absolutely no improvement to the long tunnel from his open drain site on his calf. We have tried to get him into see vascular surgery sooner to consider the possibility of simply filleting the tract open and allowing it to heal from the bottom up, likely with a wound VAC. They have not yet scheduled a sooner appointment than his current mid April 09/14/2021: He was seen by vascular surgery and they took him to the operating room last week. They opened a portion of the tunnel, but did not extend the entire length of the known open subcutaneous tract. I read Dr. Claretha Cooper operative note and it is not clear from that documentation why only a portion of the tract was opened. The heaped up granulation tissue was curetted and removed from at least some portion of the tract. They did place a wound VAC and applied an Unna boot to the leg. The ulcer on his left first metatarsal head is smaller today. The bed looks good and there is just a small amount of surrounding callus. 09/21/2021: The ulcer on his left first metatarsal head looks to be stalled. There is some callus surrounding the wound but the wound bed itself does not appear particularly dynamic. The tunnel tract on his lateral left leg seems to be roughly the same length or perhaps slightly smaller but the wound bed appears healthy with good granulation tissue. He opened up a new wound on his medial thigh and the site of a prior surgical incision. He says  that he did this unconsciously in his sleep by scratching. 09/28/2021: Unfortunately, the ulcer on his left first metatarsal head has extended underneath the callus toward the dorsum of his foot. The medial thigh wounds are roughly the same. The tunnel on his lateral left leg continues to be  problematic; it is longer than we are able to actually probe with a Q-tip. I am still not [name], [name] ([id number]) [id number] Page 11 of 17 certain as to why Dr. Donzetta Matters did not open this up entirely when he took the patient to the operating room. We will likely be back in the same situation with just a small superficial opening in a long unhealed tract, as the open portion is granulating in nicely. 10/02/2021: The patient was initially scheduled for a nurse visit, but we are also applying a total contact cast today. The plantar foot wound looks clean without significant accumulated callus. We have been applying Prisma silver collagen to the site. 10/05/2021: The patient is here for his first total contact cast change. We have tried using gauze packing strips in the tunnel on his lateral leg wound, but this does not seem to be working any better than the white VAC foam. The foot ulcer looks about the same with minimal periwound callus. Medial thigh wound is clean with just some overlying eschar. 10/12/2021: The plantar foot wound is stable without any significant accumulation of periwound callus. The surface is viable with good granulation tissue. The medial thigh wounds are much smaller and are epithelializing. On the other hand, he had purulent drainage coming from the tunnel on his lateral leg. He does go back to see Dr. Donzetta Matters next week and is planning to ask him why the wound tunnel was not completely opened at the time of his most recent operation. 10/19/2021: The plantar foot wound is markedly improved and has epithelial tissue coming through the surface. The medial thigh wounds are  nearly closed with just a tiny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

## 2022-04-05 ENCOUNTER — Encounter (HOSPITAL_BASED_OUTPATIENT_CLINIC_OR_DEPARTMENT_OTHER): Payer: Medicare Other | Admitting: General Surgery

## 2022-04-05 DIAGNOSIS — I87322 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: Secondary | ICD-10-CM | POA: Diagnosis not present

## 2022-04-05 NOTE — Progress Notes (Signed)
[name], STOLAR ([id number]) [id number] Page 1 of 17 Visit Report for 04/05/2022 Chief Complaint Document Details Patient Name: Date of Service: [name], VANAMAN 04/05/2022 1:15 PM Medical Record Number: [id number] Patient Account Number: [id number] Date of Birth/Sex: Treating RN: 1950/10/16 (71 y.o. M) Primary Care Provider: [name] Other Clinician: Referring Provider: Treating Provider/Extender: [name] in Treatment: 65 Information Obtained from: Patient Chief Complaint Left leg and foot ulcers 04/12/2021; patient is here for wounds on his left lower leg and left plantar foot over the first metatarsal head Electronic Signature(s) Signed: 04/05/2022 2:38:53 PM By: Fredirick Maudlin MD FACS Entered By: Fredirick Maudlin on 04/05/2022 14:38:53 -------------------------------------------------------------------------------- Debridement Details Patient Name: Date of Service: [name]. 04/05/2022 1:15 PM Medical Record Number: [id number] Patient Account Number: [id number] Date of Birth/Sex: Treating RN: Feb 21, 1951 (71 y.o. [name] Primary Care Provider: [name] Other Clinician: Referring Provider: Treating Provider/Extender: [name] in Treatment: 51 Debridement Performed for Assessment: Wound #22 Left,Lateral Lower Leg Performed By: Physician Fredirick Maudlin, MD Debridement Type: Debridement Level of Consciousness (Pre-procedure): Awake and Alert Pre-procedure Verification/Time Out Yes - 14:20 Taken: Start Time: 14:20 T Area Debrided (L x W): otal 5.2 (cm) x 1.2 (cm) = 6.24 (cm) Tissue and other material debrided: Non-Viable, Biofilm Level: Non-Viable Tissue Debridement Description: Selective/Open Wound Instrument: Curette Bleeding: Minimum Hemostasis Achieved: Pressure End Time: 14:22 Procedural Pain: 0 Post Procedural Pain: 0 Response to Treatment: Procedure was  tolerated well Level of Consciousness (Post- Awake and Alert procedure): Post Debridement Measurements of Total Wound Length: (cm) 5.2 Width: (cm) 1.2 Depth: (cm) 0.1 Volume: (cm) 0.49 Character of Wound/Ulcer Post Debridement: Improved Post Procedure Diagnosis [name] ([id number]) [id number] Page 2 of 17 Same as Pre-procedure Notes Scribed for Dr. Celine Ahr by J.Scotton Electronic Signature(s) Signed: 04/05/2022 3:03:51 PM By: Fredirick Maudlin MD FACS Signed: 04/05/2022 5:50:33 PM By: Dellie Catholic RN Entered By: Dellie Catholic on 04/05/2022 14:23:31 -------------------------------------------------------------------------------- HPI Details Patient Name: Date of Service: [name]. 04/05/2022 1:15 PM Medical Record Number: [id number] Patient Account Number: [id number] Date of Birth/Sex: Treating RN: 29-May-1951 (71 y.o. M) Primary Care Provider: [name] Other Clinician: Referring Provider: Treating Provider/Extender: [name] in Treatment: 51 History of Present Illness HPI Description: 10/11/17; Mr. Westberg is a 71 year old man who tells me that in 2015 he slipped down the latter traumatizing his left leg. He developed a wound in the same spot the area that we are currently looking at. He states this closed over for the most part although he always felt it was somewhat unstable. In 2016 he hit the same area with the door of his car had this reopened. He tells me that this is never really closed although sometimes an inflow it remains open on a constant basis. He has not been using any specific dressing to this except for topical antibiotics the nature of which were not really sure. His primary doctor did send him to see Dr. [name] of interventional cardiology. He underwent an angiogram on 08/06/17 and he underwent a PTA and directional atherectomy of the lesser distal SFA and popliteal arteries which resulted in  brisk improvement in blood flow. It was noted that he had 2 vessel runoff through the anterior tibial and peroneal. He is also been to see vascular and interventional radiologist. He was not felt to have any significant superficial venous insufficiency. Presumably is not a candidate for any ablation. It was suggested he come here  for wound care. The patient is a type II diabetic on insulin. He also has a history of venous insufficiency. ABIs on the left were noncompressible in our clinic 10/21/17; patient we admitted to the clinic last week. 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 We put Iodosorb on him after an aggressive debridement and 3 layer compression. He complained of pain in his ankle and itching with is skin in fact he scratched the area on the medial calf superiorly at the rim of our wraps and he has 2 small open areas in that location today which are new. I changed his primary dressing today to silver collagen. As noted he is already had revascularization and does not have any significant superficial venous insufficiency that would be amenable to ablation 10/28/17; patient admitted to the clinic 2 weeks ago. He has a smaller Wound. Scratch injury from last week revealed. There is large wound over the tibial area. This is smaller. Granulation looks healthy. No need for debridement. 11/04/17; the wound on the left lateral calf looks better. Improved dimensions. Surface of this looks better. We've been maintaining him and Kerlix Coban wraps. He finds this much more comfortable. Silver collagen dressing 11/11/17; left lateral Wound continues to look healthy be making progress. Using a #5 curet I removed removed nonviable skin from the surface of the wound and then necrotic debris from the wound surface. Surface of the wound continues to look healthy. He also has an open area on the left great toenail bed. We've been using topical  antibiotics. 11/19/17; left anterior lateral wound continues to look healthy but it's not closed. He also had a small wound above this on the left leg Initially traumatic wounds in the setting of significant chronic venous insufficiency and stasis dermatitis 11/25/17; left anterior wounds superiorly is closed still a small wound inferiorly. 12/02/17; left anterior tibial area. Arrives today with adherent callus. Post debridement clearly not completely closed. Hydrofera Blue under 3 layer compression. 12/09/17; left anterior tibia. Circumferential eschar however the wound bed looks stable to improved. We've been using Hydrofera Blue under 3 layer compression 12/17/17; left anterior tibia. Apparently this was felt to be closed however when the wrap was taken off there is a skin tear to reopen wounds in the same area we've been using Hydrofera Blue under 3 layer compression 12/23/17 left anterior tibia. Not close to close this week apparently the [facility] was stuck to this again. Still circumferential eschar requiring debridement. I put a contact layer on this this time under the Hydrofera Blue 12/31/17; left anterior tibia. Wound is better slight amount of hyper-granulation. Using Hydrofera Blue over Adaptic. 01/07/18; left anterior tibia. The wound had some surface eschar however after this was removed he has no open wound.he was already revascularized by Dr. [name] when he came to our clinic with atherectomy of the left SFA and popliteal artery. He was also sent to interventional radiology for venous reflux studies. He was not felt to have significant reflux but certainly has chronic venous changes of his skin with hemosiderin deposition around this area. He will definitely need to lubricate his skin and wear compression stocking and I've talked to him about this. READMISSION 05/26/2018 This is a now 71 year old man we cared for with traumatic wounds on his left anterior lower extremity. He had been  previously revascularized during that admission by Dr. [name]. Apparently in follow-up Dr. [name] noted that he had deterioration  in his arterial status. He underwent a stent placement in the distal left SFA on 04/22/2018. 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 He went back to the angiography suite on 04/30/2018 he underwent PTA and balloon angioplasty of the occluded left mid anterior tibial artery, thrombotic occlusion went from 100 to 0% which reconstitutes the posterior tibial artery. He had thrombectomy and aspiration of the peroneal artery. The stent placed in the distal SFA left SFA was still occluded. 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 SHAKA, CARDIN ([id number]) [id number] Page 3 of 17 He has been recuperating at [place] home in [facility] after the most recent hospitalization. He is going home tomorrow. He tells me that 2 weeks ago he traumatized the tip of his left fifth toe. He came in urgently for our review of this. This was a history of before I noted that Dr. [name] had already noted dry gangrenous changes of the left fifth toe 06/09/2018; 2-week follow-up. I did contact Dr. [name] after his last appointment and he apparently saw 1 of Dr. Irven Shelling colleagues the next day. He does not follow-up with Dr. [name] himself until Thursday of this week. 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 Nevertheless there is no evidence of infection no drainage and no pain. He had a new area that this week when we were signing him in today on the left anterior mid tibia area, this is in close proximity to the previous wound we have dealt with in this clinic. 06/23/2018; 2-week  follow-up. I did not receive a recent note from Dr. [name] to review today. Our office is trying to obtain this. He is apparently not planning to do further vascular interventions and wondered about compression to try and help with the patient's chronic venous insufficiency. However we are also concerned about the arterial flow. He arrives in clinic today with a new area on the left third toe. The areas on the calf/anterior tibia are close to closing. The left fifth toe is still mummified using Betadine. -In reviewing things with the patient he has what sounds like claudication with mild to moderate amount of activity. 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 I prescribed Levaquin over the phone while we attempted to arrange a plan of care. However the patient called yesterday to report he had low-grade fever and he came in today acutely. There is been a marked deterioration in the left third toe with spreading cellulitis up into the dorsal left foot. He was referred to the emergency room. Readmission: 06/29/2020 patient presents today for reevaluation here in our clinic he was previously treated by Dr. Dellia Nims at the latter part of 2019 in 2 the beginning of 2020. Subsequently we have not seen him since that time in the interim he did have evaluation with vein and vascular specialist specifically Dr. Anice Paganini who did perform quite extensive work for a left femoral to anterior tibial artery bypass. With that being said in the interim the patient has developed significant lymphedema and has wounds that he tells me have really never healed in regard to the incision site on the left leg. He also has multiple wounds on  the feet for various reasons some of which is that he tends to pick at his feet. Fortunately there is no signs of active infection systemically at this time he does have some wounds that are little bit deeper but most are fairly superficial he seems to have  good blood flow and overall everything appears to be healthy I see no bone exposed and no obvious signs of osteomyelitis. I do not know that he necessarily needs a x-ray at this point although that something we could consider depending on how things progress. The patient does have a history of lymphedema, diabetes, this is type II, chronic kidney disease stage III, hypertension, and history of peripheral vascular disease. 07/05/2020; patient admitted last week. Is a patient I remember from 2019 he had a spreading infection involving the left foot and we sent him to the hospital. 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 He subsequently had a left femoral to anterior tibial bypass by Dr.Cain vein and vascular. He also has severe lymphedema with chronic skin changes related to that on the left leg. The most problematic area that was new today was on the left medial great toe. This was apparently a small area last week there was purulent drainage which our intake nurse cultured. Also areas on the left medial foot and heel left lateral foot. He has 2 areas on the left medial calf left lateral calf in the setting of the severe lymphedema. 07/13/2020 on evaluation today patient appears to be doing better in my opinion compared to his last visit. The good news is there is no signs of active infection systemically and locally I do not see any signs of infection either. He did have an x-ray which was negative that is great news he had a culture which showed MRSA but at the same time he is been on the doxycycline which has helped. I do think we may want to extend this for 7 additional days 1/25; patient admitted to the clinic a few weeks ago. 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 We have been putting him under compression his edema control is a lot better but he is severe verricused skin on the left leg. He is really done  quite well he still has an open area on the left medial calf and the left medial first metatarsal head. We have been using silver collagen on the leg silver alginate on the foot 07/27/2020 upon evaluation today patient appears to be doing decently well in regard to his wounds. He still has a lot of dry skin on the left leg. Some of this is starting to peel back and I think he may be able to have them out by removing some that today. Fortunately there is no signs of active infection at this time on the left leg although on the right leg he does appear to have swelling and erythema as well as some mild warmth to touch. This does have been concerned about the possibility of cellulitis although within the differential diagnosis I do think that potentially a DVT has to be at least considered. We need to rule that out before proceeding would just call in the cellulitis. Especially since he is having pain in the posterior aspect of his calf muscle. 2/8; the patient had seen sparingly. He has severe skin changes of chronic lymphedema in the left leg thickened hyperkeratotic verrucous skin. He has an  open wound on the medial part of the left first met head left mid tibia. He also has a rim of nonepithelialized skin in the anterior mid tibia. He brought in the AmLactin lotion that was been prescribed although I am not sure under compression and its utility. There concern about cellulitis on the right lower leg the last time he was here. He was put on on antibiotics. His DVT rule out was negative. The right leg looks fine he is using his stocking on this area 08/10/2020 upon evaluation today patient appears to be doing well with regard to his leg currently. He has been tolerating the dressing changes without complication. Fortunately there is no signs of active infection which is great news. Overall very pleased with where things stand. 2/22; the patient still has an area on the medial part of the left first met his  head. This looks better than when I last saw this earlier this month he has a rim of epithelialization but still some surface debris. Mostly everything on the left leg is healed. There is still a vulnerable in the left mid tibia area. 08/30/2020 upon evaluation today patient appears to be doing much better in regard to his wounds on his foot. 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 Nonetheless I think we will address that today I Minna send in a prescription for him in that regard. Overall though there does not appear to be any signs of significant worsening. 09/07/2020 on evaluation today patient's wounds over his left foot appear to be doing excellent. I do not see any signs of infection there is some callus buildup this can require debridement for certain but overall I feel like he is managing quite nicely. He still using the AmLactin cream which has been beneficial for him as well. 3/22; left foot wound is closed. There is no open area here. He is using ammonium lactate lotion to the lower extremities to help exfoliate dry cracked skin. He has compression stockings from elastic therapy in [place]. The wound on the medial part of his left first met head is healed today. READMISSION 04/12/2021 Mr. Drone is a patient we know fairly well he had a prolonged stay in clinic in 2019 with wounds on his left lateral and left anterior lower extremity in the setting of chronic venous insufficiency. More recently he was here earlier this year with predominantly an area on his left foot first metatarsal head plantar and he says the plantar foot broke down on its not long after we discharged him but he did not come back here. The last few months areas of broken down on his left anterior and again the left lateral lower extremity. The leg itself is very swollen chronically  enlarged a lot of hyperkeratotic dry Berry Q skin in the left lower leg. His edema extends well into the thigh. He was seen by Dr. Donzetta Matters. He had ABIs on 03/02/2021 showing an ABI on the right of 1 with a TBI of 0.72 his ABI in the left at 1.09 TBI of 0.99. Monophasic and biphasic waveforms on the right. On the left monophasic waveforms were noted he went on to have an angiogram on 03/27/2021 this showed the aortic aortic and iliac segments were free of flow-limiting stenosis the left common femoral vein to evaluate the left femoral to anterior tibial artery bypass was  unobstructed the bypass was patent without any areas of stenosis. We discharged the patient in bilateral juxta lite stockings but very clearly that was not sufficient to control the swelling and maintain skin integrity. He is clearly going to need compression pumps. 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 This is fortunate because he is on his feet for long periods of time. 10/27; patient comes in with our intake nurse reporting copious amount of green drainage from the left anterior mid tibia the left dorsal foot and to a lesser extent the left medial mid tibia. We left the compression wrap on all week for the amount of edema in his left leg is quite a bit better. We use silver alginate as PEYTON, SPENGLER ([id number]) [phone].pdf Page 4 of 17 the primary dressing 11/3; edema control is good. Left anterior lower leg left medial lower leg and the plantar first metatarsal head. The left anterior lower leg required debridement. Deep tissue culture I did of this wound showed MRSA I put him on 10 days of doxycycline which she will start today. We have him in compression wraps. He has a security card and [employer] however he is retiring on November 15. We will need to then get him into a better offloading boot for the left foot perhaps a total contact cast 11/10; edema  control is quite good. Left anterior and left medial lower leg wounds in the setting of chronic venous insufficiency and lymphedema. He also has a substantial area over the left plantar first metatarsal head. 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 He has significant hypergranulation on the left plantar foot wound. The patient is a diabetic but he does not have significant PAD 11/17; edema control is quite good. Left anterior and left medial lower leg wounds look better. The really concerning area remains the area on the left plantar first metatarsal head. He has a rim of epithelialization. 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 Starting today with a forefoot off loader but . possibly a total contact cast. 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 Arrives with a very significant odor from I think all of the wounds on the left leg and the left foot. Because of this I did not put a total contact cast on him today but will could still consider this. His wife was having cataract surgery which is the reason he missed the appointment 12/6. I saw this man 5 days ago with a swelling below the popliteal fossa. I thought he actually might have a Baker's cyst however the DVT rule out study that we could arrange right away was negative the technician told me this was not a ruptured Baker's cyst. We attempted to get this aspirated by under ultrasound guidance in interventional radiology however all they did was an ultrasound however it shows an extensive fluid collection 62 x 8 x 9.4 in the left thigh and left calf. The patient states he thinks this started 8 days ago or so but he really is not  complaining of any pain, fever  or systemic symptoms. He has not ha 12/20; after some difficulty I managed to get the patient into see Dr. Donzetta Matters. Eventually he was taken into the hospital and had a drain put in the fluid collection below his left knee posteriorly extending into the posterior thigh. He still has the drain in place. Culture of this showed moderate staff aureus few Morganella and few Klebsiella he is now on doxycycline and ciprofloxacin as suggested by infectious disease he is on this for a month. The drain will remain in place until it stops draining 12/29; he comes in today with the 1 wound on his left leg and the area on the left plantar first met head significantly smaller. Both look healthy. He still has the drain in the left leg. He says he has to change this daily. Follows up with Dr. Donzetta Matters on January 11. 06/29/2021; the wounds that I am following on the left leg and left first met head continued to be quite healthy. However the area where his inferior drain is in place had copious amounts of drainage which was green in color. The wound here is larger. Follows up with Dr. Gwenlyn Saran of vein and vascular his surgeon next week as well as infectious disease. He remains on ciprofloxacin and doxycycline. He is not complaining of excessive pain in either one of the drain areas 1/12; the patient saw vascular surgery and infectious disease. Vascular surgery has left the drain in place as there was still some notable drainage still see him back in 2 weeks. Dr. Velna Ochs stop the doxycycline and ciprofloxacin and I do not believe he follows up with them at this point. Culture I did last week showed both doxycycline resistant MRSA and Pseudomonas not sensitive to ciprofloxacin although only in rare titers 1/19; the patient's wound on the left anterior lower leg is just about healed. We have continued healing of the area that was medially on the left leg. Left first plantar metatarsal head continues to get  smaller. The major problem here is his 2 drain sites 1 on the left upper calf and lateral thigh. There is purulent drainage still from the left lateral thigh. I gave him antibiotics last week but we still have recultured. He has the drain in the area I think this is eventually going to have to come out. I suspect there will be a connecting wound to heal here perhaps with improved VAc 1/26; the patient had his drain removed by vein and vascular on 1/25/. This was a large pocket of fluid in his left thigh that seem to tunnel into his left upper calf. 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 His mention his Penrose drain was removed today. He now has a tunneling wound on his left calf and left thigh. Both of these probe widely towards each other although I cannot really prove that they connect. Both wounds on his lower leg anteriorly are closed and his area over the first metatarsal head on his right foot continues to improve. We are using Hydrofera Blue here. He also saw infectious disease culture of the abscess they noted was polymicrobial with MRSA, Morganella and Klebsiella he was treated with doxycycline and ciprofloxacin for 4 weeks ending on 07/03/2021. They did not recommend any further antibiotics. Notable that while he still had the Penrose drain in place last week he had purulent drainage coming out of the inferior IandD site this grew [facility], MRSA and Pseudomonas but there does not appear to  be any active infection in this area today with the drain out and he is not systemically unwell 2/2; with regards to the drain sites the superior one on the thigh actually is closed down the one on the upper left lateral calf measures about 8 and half centimeters which is an improvement seems to be less prominent although still with a lot of drainage. The only remaining wound is over the first metatarsal head on the left foot and this looks to be continuing to improve with  Hydrofera Blue. 2/9; the area on his plantar left foot continues to contract. Callus around the wound edge. The drain sites specifically have not come down in depth. We put the wound VAC on Monday he changed the canister late last night our intake nurse reported a pocket of fluid perhaps caused by our compression wraps 2/16; continued improvement in left foot plantar wound. drainage site in the calf is not improved in terms of depth (wound vac) 2/23; continued improvement in the left foot wound over the first metatarsal head. With regards to the drain sites the area on his thigh laterally is healed however the open area on his calf is small in terms of circumference by still probes in by about 15 cm. Within using the wound VAC. Hydrofera Blue on his foot 08/24/2021: The left first metatarsal head wound continues to improve. The wound bed is healthy with just some surrounding callus. Unfortunately the open drain site on his calf remains open and tunnels at least 15 cm (the extent of a Q-tip). This is despite several weeks of wound VAC treatment. Based on reading back through the notes, there has been really no significant change in the depth of the wound, although the orifice is smaller and the more cranial wound on his thigh has closed. I suspect the tunnel tracks nearly all the way to this location. 08/31/2021: Continued improvement in the left first metatarsal head wound. There has been absolutely no improvement to the long tunnel from his open drain site on his calf. We have tried to get him into see vascular surgery sooner to consider the possibility of simply filleting the tract open and allowing it to heal from the bottom up, likely with a wound VAC. They have not yet scheduled a sooner appointment than his current mid April 09/14/2021: He was seen by vascular surgery and they took him to the operating room last week. They opened a portion of the tunnel, but did not extend the entire length of the  known open subcutaneous tract. I read Dr. Claretha Cooper operative note and it is not clear from that documentation why only a portion of the tract was opened. The heaped up granulation tissue was curetted and removed from at least some portion of the tract. They did place a wound VAC and applied an Unna boot to the leg. The ulcer on his left first metatarsal head is smaller today. The bed looks good and there is just a small amount of surrounding callus. 09/21/2021: The ulcer on his left first metatarsal head looks to be stalled. There is some callus surrounding the wound but the wound bed itself does not appear particularly dynamic. The tunnel tract on his lateral left leg seems to be roughly the same length or perhaps slightly smaller but the wound bed appears healthy with good granulation tissue. He opened up a new wound on his medial thigh and the site of a prior surgical incision. He says that he did this unconsciously in  his sleep by scratching. 09/28/2021: Unfortunately, the ulcer on his left first metatarsal head has extended underneath the callus toward the dorsum of his foot. The medial thigh wounds are roughly the same. The tunnel on his lateral left leg continues to be problematic; it is longer than we are able to actually probe with a Q-tip. I am still not PASCO, MARCHITTO ([id number]) [id number] Page 5 of 17 certain as to why Dr. Donzetta Matters did not open this up entirely when he took the patient to the operating room. We will likely be back in the same situation with just a small superficial opening in a long unhealed tract, as the open portion is granulating in nicely. 10/02/2021: The patient was initially scheduled for a nurse visit, but we are also applying a total contact cast today. The plantar foot wound looks clean without significant accumulated callus. We have been applying Prisma silver collagen to the site. 10/05/2021: The patient is here for his first total contact cast  change. We have tried using gauze packing strips in the tunnel on his lateral leg wound, but this does not seem to be working any better than the white VAC foam. The foot ulcer looks about the same with minimal periwound callus. Medial thigh wound is clean with just some overlying eschar. 10/12/2021: The plantar foot wound is stable without any significant accumulation of periwound callus. The surface is viable with good granulation tissue. The medial thigh wounds are much smaller and are epithelializing. On the other hand, he had purulent drainage coming from the tunnel on his lateral leg. He does go back to see Dr. Donzetta Matters next week and is planning to ask him why the wound tunnel was not completely opened at the time of his most recent operation. 10/19/2021: The plantar foot wound is markedly improved and has epithelial tissue coming through the surface. The medial thigh wounds are nearly closed with just a tiny open area. He did see Dr. Donzetta Matters earlier this week and apparently they did discuss the possibility of opening the sinus tract further and enabling a wound VAC application. Apparently there are some limits as to what Dr. 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 I think if we could get the tract open to the level of the popliteal fossa, this would greatly aid in her ability to get this chart closed. That being said, however, today when I probed the tract with a Q-tip, I was not able to insert the entirety of the Q-tip as I have on previous occasions. The tunnel is shorter by about 4 cm. The surface is clean with good granulation tissue and no further episodes of purulent drainage. 10/30/2021: Last week, the patient underwent surgery and had the long tract in his leg opened. There was a rind that was debrided, according to the operative report. His medial thigh ulcers are closed. The plantar foot wound is clean with a good surface and some built up surrounding  callus. 11/06/2021: The overall dimensions of the large wound on his lateral leg remain about the same, but there is good granulation tissue present and the tunneling is a little bit shorter. He has a new wound on his anterior tibial surface, in the same location where he had a similar lesion in the past. The plantar foot wound is clean with some buildup surrounding callus. Just toward the medial aspect of his foot, however, there is an area of darkening that once debrided, revealed another opening in the skin  surface. 11/13/2021: The anterior tibial surface wound is closed. The plantar foot wound has some surrounding callus buildup. The area of darkening that I debrided last week and revealed an opening in the skin surface has closed again. The tunnel in the large wound on his lateral leg has come in by about 3 cm. There is healthy granulation tissue on the entire wound surface. 11/23/2021: The patient was out of town last week and did wet-to-dry dressings on his large wound. 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 Unfortunately, he picked open the wound on his left medial thigh. He says that it was itching and he just could not stop scratching it until it was open again. The wound on his plantar foot is smaller and has not accumulated a tremendous amount of callus. The lateral leg wound is shallower and the tunnel has also decreased in depth. There is just a little bit of slough accumulation on the surface. 11/30/2021: Another portion of his left medial thigh has been opened up. All of these wounds are fairly superficial with just a little bit of slough and eschar accumulation. The wound on his plantar foot is almost closed with just a bit of eschar and periwound callus accumulation. The lateral leg wound is nearly flush with the surrounding skin and the tunnel is markedly shallower. 12/07/2021: There is just 1 open area on his left  medial thigh. It is clean with just a little bit of perimeter eschar. The wound on his plantar foot continues to contract and just has some eschar and periwound callus accumulation. The lateral leg wound is closing at the more distal aspect and the tunnel is smaller. The surface is nearly flush with the surrounding skin and it has a good bed of granulation tissue. 12/14/2021: The thigh and foot wounds are closed. The lateral leg wound has closed over approximately half of its length. The tunnel continues to contract and the surface is now flush with the surrounding skin. The wound bed has robust granulation tissue. 12/22/2021: The thigh and foot wounds have reopened. The foot wound has a lot of callus accumulation around and over it. The thigh wound is tiny with just a little bit of slough in the wound bed. The lateral leg wound continues to contract. His vascular surgeon took the wound VAC off earlier in the week and the patient has been doing wet-to-dry dressings. There is a little slough accumulation on the surface. The tunnel is about 3 cm in depth at this point. 12/28/2021: The thigh wound is closed again. The foot wound has some callus that subsequently has peeled back exposing just a small slit of a wound. The lateral leg wound Is down to about half the size that it originally was and the tunnel is down to about half a centimeter in depth. 01/04/2022: The thigh wound remains closed. The foot wound has heavy callus overlying the wound site. Once this was debrided, the wound was found to be closed. The lateral leg wound is smaller again this week and very superficial. No tunnel could be identified. 01/12/2022: The thigh and foot wounds both remain closed. The lateral leg wound is now nearly flush with the skin surface. There is good granulation tissue present with a light layer of slough. 01/19/2022: Due to the way his wrap was placed, the patient did not change the dressing on his thigh at all and so  the foam was saturated and  his skin is macerated. There is a light layer of slough on the wound surface. The underlying granulation tissue is robust and healthy-appearing. He has heavy callus buildup at the site of his first metatarsal head wound which is still healed. 02/01/2022: He has been in silver alginate. When he removed the dressing from his thigh wound, however, some leg, superficially reopening a portion of the wound that had healed. In addition, underneath the callus at his left first metatarsal head, there appears to be a blister and the wound appears to be open again. 02/08/2022: The lateral leg wound has contracted substantially. There is eschar and a light layer of slough present. He says that it is starting to pull and is uncomfortable. On inspection, there is some puckering of the scar and the eschar is quite dry; this may account for his symptoms. On his first metatarsal head, the wound is much smaller with just some eschar on the surface. The callus has not reaccumulated. He reports that he had a blister come up on his medial thigh wound at the distal aspect. It popped and there is now an opening in his skin again. Looking back through his [place] of wound photos, there is what looks like a permanent suture just deep to this location and it may be trying to erode through. We have been using silver alginate on his wounds. 02/15/2022: The lateral leg wound is about half the size it was last week. It is clean with just a little perimeter eschar and light slough. The wound on his first metatarsal head is about the same with heavy callus overlying it. The medial thigh wound is closed again. He does have some skin changes on the top of his foot that looks potentially yeast related. 02/22/2022: The skin on the top of his foot improved with the use of a topical antifungal. The lateral leg wound continues to contract and is again smaller this week. There is a little bit of slough and eschar on  the surface. The first metatarsal head wound is a little bit smaller but has reaccumulated a thick callus over the top. He decided to try to trim his toenail and ultimately took the entire nail off of his left great toe. 03/02/2022: His lateral leg wound continues to improve, as does the wound on his left great toe. Unfortunately, it appears that somehow his foot got wet and moisture seeped in through the opening causing his skin to lift. There is a large wound now overlying his first metatarsal on both the plantar, medial, and dorsal portion of his foot. There is necrotic tissue and slough present underneath the shaggy macerated skin. 03/08/2022: The lateral leg wound is smaller again today. There is just a light layer of slough and eschar on the surface. The great toe wound is smaller again today. The first metatarsal wound is a little bit smaller today and does not look nearly as necrotic and macerated. There is still slough and nonviable tissue present. 03/15/2022: The lateral leg wound is narrower and just has a little bit of light slough buildup. The first metatarsal wound still has a fair amount of moisture [name], [name] ([id number]) [phone].pdf Page 6 of 17 affecting the periwound skin. The great toe wound is healed. 03/22/2022: The lateral leg wound is now isolated to just at the level of his knee. There is some eschar and slough accumulation. The first metatarsal head wound has epithelialized tremendously and is about half the size that it was last week.  He still has some maceration on the top of his foot and a fungal odor is present. 03/29/2022: T oday the patient's foot was macerated, suggesting that the cast got wet. The patient has also been picking at his dry skin and has enlarged the wound on his left lateral leg. In the time between having his cast removed and my evaluation, he had picked more dry skin and opened up additional wounds on his Achilles area and  dorsal foot. The plantar first metatarsal head wound, however, is smaller and clean with just macerated callus around the perimeter and light slough on the surface. The lateral leg wound measured a little bit larger but is also fairly clean with eschar and minimal slough. 04/02/2022: The patient had vascular studies done last Friday and so his cast was not applied. He is here today to have that done. 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 04/05/2022: Both wounds are smaller and quite clean. There is just a little biofilm on the lateral leg wound. Electronic Signature(s) Signed: 04/05/2022 2:39:59 PM By: Fredirick Maudlin MD FACS Entered By: Fredirick Maudlin on 04/05/2022 14:39:59 -------------------------------------------------------------------------------- Physical Exam Details Patient Name: Date of Service: [name] 04/05/2022 1:15 PM Medical Record Number: [id number] Patient Account Number: [id number] Date of Birth/Sex: Treating RN: [birth year] (71 y.o. M) Primary Care Provider: [name] Other Clinician: Referring Provider: Treating Provider/Extender: [name] in Treatment: 77 Constitutional Hypertensive, asymptomatic. . . . No acute distress.Marland Kitchen Respiratory Normal work of breathing on room air.. Notes 04/05/2022: Both wounds are smaller and quite clean. There is just a little biofilm on the lateral leg wound. Electronic Signature(s) Signed: 04/05/2022 2:54:54 PM By: Fredirick Maudlin MD FACS Previous Signature: 04/05/2022 2:51:59 PM Version By: Fredirick Maudlin MD FACS Entered By: Fredirick Maudlin on 04/05/2022 14:54:54 -------------------------------------------------------------------------------- Physician Orders Details Patient Name: Date of Service: [name]. 04/05/2022 1:15 PM Medical Record Number: [id number] Patient Account Number: [id number] Date of Birth/Sex: Treating RN: 06/21/1951 (71 y.o. 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 - Dr Celine Ahr - Room 1 TCC Return A Anesthetic Wound #22 Left,Lateral Lower Leg (In clinic) Topical Lidocaine 4% applied to wound bed Wound #27 Left,Medial Foot (In clinic) Topical Lidocaine 4% applied to wound bed Bathing/ Shower/ Hygiene May shower with protection but do not get wound dressing(s) wet. - Use a cast protector so you can shower without getting your cast) wet Edema Control - Lymphedema / SCD / Other Elevate legs to the level of the heart or above for 30 minutes daily and/or when sitting, a frequency of: - throughout the day Avoid standing for long periods of time. Patient to wear own compression stockings every day. - on right leg; Moisturize legs daily. - Ammonium lactate to right leg daily Compression stocking or Garment 20-30 mm/Hg pressure to: - left leg daily Off-Loading Total Contact Cast to Left Lower Extremity - TCC left leg Other: -  minimal weight bearing left foot Wound Treatment Wound #22 - Lower Leg Wound Laterality: Left, Lateral Cleanser: Soap and Water 3 x Per Week/30 Days Discharge Instructions: May shower and wash wound with dial antibacterial soap and water prior to dressing change. Cleanser:  Wound Cleanser 3 x Per Week/30 Days Discharge Instructions: Cleanse the wound with wound cleanser prior to applying a clean dressing using gauze sponges, not tissue or cotton balls. Peri-Wound Care: Sween Lotion (Moisturizing lotion) 3 x Per Week/30 Days Discharge Instructions: Apply moisturizing lotion to the leg Prim Dressing: KerraCel Ag Gelling Fiber Dressing, 4x5 in (silver alginate) 3 x Per Week/30 Days ary Discharge Instructions: Apply silver alginate to wound bed as instructed Secondary Dressing: ABD Pad, 8x10 (Generic) 3 x Per Week/30 Days Discharge Instructions: Apply over primary dressing as directed. Secured With: Elastic Bandage 4 inch (ACE bandage) 3 x Per Week/30 Days Discharge Instructions: Secure with ACE bandage as directed. Wound #27 - Foot Wound Laterality: Left, Medial Peri-Wound Care: Sween Lotion (Moisturizing lotion) 1 x Per Week Discharge Instructions: Apply moisturizing lotion as directed Prim Dressing: Hydrofera Blue Classic Foam, 4x4 in 1 x Per Week ary Discharge Instructions: Moisten with saline prior to applying to wound bed Secondary Dressing: Optifoam Non-Adhesive Dressing, 4x4 in 1 x Per Week Discharge Instructions: Apply over primary dressing cut to make foam donut Secondary Dressing: Woven Gauze Sponges 2x2 in 1 x Per Week Discharge Instructions: Apply over primary dressing as directed. Secured With: 81M Medipore H Soft Cloth Surgical T ape, 4 x 10 (in/yd) 1 x Per Week Discharge Instructions: Secure with tape as directed. Add-Ons: TCC-EZ Cast Boot, [place]arc[place],Large 1 x Per Week Discharge Instructions: SIZE 3 TCC Electronic Signature(s) Signed: 04/05/2022 4:11:25 PM By: Fredirick Maudlin MD FACS Signed: 04/05/2022 5:50:33 PM By: Dellie Catholic RN Entered By: Dellie Catholic on 04/05/2022 15:15:00 [name] ([id number]) [id number] Page 8 of  17 -------------------------------------------------------------------------------- Problem List Details Patient Name: Date of Service: JAIQUAN, TEMME 04/05/2022 1:15 PM Medical Record Number: [id number] Patient Account Number: [id number] Date of Birth/Sex: Treating RN: 1950-07-27 (71 y.o. M) Primary Care Provider: [name] Other Clinician: Referring Provider: Treating Provider/Extender: [name] in Treatment: 47 Active Problems ICD-10 Encounter Code Description Active Date MDM Diagnosis 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 04/12/2021 No Yes I89.0 Lymphedema, not elsewhere classified 04/12/2021 No Yes I87.322 Chronic venous hypertension (idiopathic) with inflammation of left lower 04/12/2021 No Yes extremity L97.828 Non-pressure chronic ulcer of other part of left lower leg with other specified 04/12/2021 No Yes severity L97.528 Non-pressure chronic ulcer of other part of left foot with other specified 04/12/2021 No Yes severity Inactive Problems ICD-10 Code Description Active Date Inactive Date E11.621 Type 2 diabetes mellitus with foot ulcer 04/12/2021 04/12/2021 E11.42 Type 2 diabetes mellitus with diabetic polyneuropathy 04/12/2021 04/12/2021 L02.416 Cutaneous abscess of left lower limb 06/13/2021 06/13/2021 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 M) Primary Care Provider: [name] Other Clinician: Referring Provider: Treating Provider/Extender: [name] in Treatment: 29 Subjective Chief Complaint Information obtained from Patient Left leg and  foot ulcers 04/12/2021; patient is here for wounds on his left lower leg and left plantar foot over the first metatarsal head History of Present Illness (HPI) 10/11/17; Mr. Prusinski is a 71 year old man who tells me that in 2015 he slipped down the latter traumatizing his left leg. He developed a wound in the same spot the area that we are currently looking at. He states this closed over for the most part although he always felt it was somewhat unstable. In 2016 he hit the same area with the door of his car had this reopened. He tells me that this is never really closed although sometimes an inflow it remains open on a constant basis. He has not been using any specific dressing to this except for topical antibiotics the nature of which were not really sure. His primary doctor did send him to see Dr. [name] of interventional cardiology. He underwent an angiogram on 08/06/17 and he underwent a PTA and directional atherectomy of the lesser distal SFA and popliteal arteries which resulted in brisk improvement in blood flow. It was noted that he had 2 vessel runoff through the anterior tibial and peroneal. He is also been to see vascular and interventional radiologist. He was not felt to have any significant superficial venous insufficiency. Presumably is not a candidate for any ablation. It was suggested he come here for wound care. The patient is a type II diabetic on insulin. He also has a history of venous insufficiency. ABIs on the left were noncompressible in our clinic 10/21/17; patient we admitted to the clinic last week. 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 We put  Iodosorb on him after an aggressive debridement and 3 layer compression. He complained of pain in his ankle and itching with is skin in fact he scratched the area on the medial calf superiorly at the rim of our wraps and he has 2 small open areas in that location today which are new. I changed his primary dressing today to silver collagen. As noted he is already had revascularization and does not have any significant superficial venous insufficiency that would be amenable to ablation 10/28/17; patient admitted to the clinic 2 weeks ago. He has a smaller Wound. Scratch injury from last week revealed. There is large wound over the tibial area. This is smaller. Granulation looks healthy. No need for debridement. 11/04/17; the wound on the left lateral calf looks better. Improved dimensions. Surface of this looks better. We've been maintaining him and Kerlix Coban wraps. He finds this much more comfortable. Silver collagen dressing 11/11/17; left lateral Wound continues to look healthy be making progress. Using a #5 curet I removed removed nonviable skin from the surface of the wound and then necrotic debris from the wound surface. Surface of the wound continues to look healthy. ooHe also has an open area on the left great toenail bed. We've been using topical antibiotics. 11/19/17; left anterior lateral wound continues to look healthy but it's not closed. ooHe also had a small wound above this on the left leg ooInitially traumatic wounds in the setting of significant chronic venous insufficiency and stasis dermatitis 11/25/17; left anterior wounds superiorly is closed still a small wound inferiorly. 12/02/17; left anterior tibial area. Arrives today with adherent callus. Post debridement clearly not completely closed. Hydrofera Blue under 3 layer compression. 12/09/17; left anterior tibia. Circumferential eschar however the wound bed looks stable to improved. We've been using Hydrofera  Blue under 3  layer compression 12/17/17; left anterior tibia. Apparently this was felt to be closed however when the wrap was taken off there is a skin tear to reopen wounds in the same area we've been using Hydrofera Blue under 3 layer compression 12/23/17 left anterior tibia. Not close to close this week apparently the [facility] was stuck to this again. Still circumferential eschar requiring debridement. I put a contact layer on this this time under the Hydrofera Blue 12/31/17; left anterior tibia. Wound is better slight amount of hyper-granulation. Using Hydrofera Blue over Adaptic. 01/07/18; left anterior tibia. The wound had some surface eschar however after this was removed he has no open wound.he was already revascularized by Dr. [name] when he came to our clinic with atherectomy of the left SFA and popliteal artery. He was also sent to interventional radiology for venous reflux studies. He was not felt to have significant reflux but certainly has chronic venous changes of his skin with hemosiderin deposition around this area. He will definitely need to lubricate his skin and wear compression stocking and I've talked to him about this. READMISSION 05/26/2018 This is a now 71 year old man we cared for with traumatic wounds on his left anterior lower extremity. He had been previously revascularized during that admission by Dr. [name]. Apparently in follow-up Dr. [name] noted that he had deterioration in his arterial status. He underwent a stent placement in the distal left SFA on 04/22/2018. 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 He went back to the angiography suite on 04/30/2018 he underwent PTA and balloon angioplasty of the occluded left mid anterior tibial artery, thrombotic occlusion went from 100 to 0% which reconstitutes the posterior tibial artery. He had thrombectomy and aspiration of the peroneal artery. The stent placed in the distal SFA left SFA was still occluded. He was  discharged on Xarelto, it was noted on the discharge summary from this hospitalization that he had gangrene at the tip of his left fifth toe and there were expectations this would auto amputate. 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 He has been recuperating at [facility] after the most recent hospitalization. He is going home tomorrow. He tells me that 2 weeks ago he traumatized the tip of his left fifth toe. He came in urgently for our review of this. This was a history of before I noted that Dr. [name] had already noted dry gangrenous changes of the left fifth toe 06/09/2018; 2-week follow-up. I did contact Dr. [name] after his last appointment and he apparently saw 1 of Dr. Irven Shelling colleagues the next day. He does not follow-up with Dr. [name] himself until Thursday of this week. 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 Nevertheless there is no evidence of infection no drainage and no pain. He had a new area that this week when we were signing him in today on the left anterior mid tibia area, this is in close proximity to the previous wound we have dealt with in this clinic. 06/23/2018; 2-week follow-up. I did not receive a recent note from Dr. [name] to review today. Our office is trying to obtain this. He is apparently not planning to do further vascular interventions and wondered about compression to try and help with the patient's chronic venous insufficiency. However we are also concerned about the arterial flow. ooHe arrives in clinic today with a new area  on the left third toe. The areas on the calf/anterior tibia are close to closing. The left fifth toe is still mummified DAHMIR, EPPERLY ([id number]) [id number] Page 10 of 17 using Betadine. -In reviewing things with the patient he has what sounds like claudication with mild to moderate amount of activity. 06/27/2018;  x-ray of his foot suggested osteomyelitis of the left third toe. I prescribed Levaquin over the phone while we attempted to arrange a plan of care. However the patient called yesterday to report he had low-grade fever and he came in today acutely. There is been a marked deterioration in the left third toe with spreading cellulitis up into the dorsal left foot. He was referred to the emergency room. Readmission: 06/29/2020 patient presents today for reevaluation here in our clinic he was previously treated by Dr. Dellia Nims at the latter part of 2019 in 2 the beginning of 2020. Subsequently we have not seen him since that time in the interim he did have evaluation with vein and vascular specialist specifically Dr. Anice Paganini who did perform quite extensive work for a left femoral to anterior tibial artery bypass. With that being said in the interim the patient has developed significant lymphedema and has wounds that he tells me have really never healed in regard to the incision site on the left leg. He also has multiple wounds on the feet for various reasons some of which is that he tends to pick at his feet. Fortunately there is no signs of active infection systemically at this time he does have some wounds that are little bit deeper but most are fairly superficial he seems to have good blood flow and overall everything appears to be healthy I see no bone exposed and no obvious signs of osteomyelitis. I do not know that he necessarily needs a x-ray at this point although that something we could consider depending on how things progress. The patient does have a history of lymphedema, diabetes, this is type II, chronic kidney disease stage III, hypertension, and history of peripheral vascular disease. 07/05/2020; patient admitted last week. Is a patient I remember from 2019 he had a spreading infection involving the left foot and we sent him to the hospital. 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 He subsequently had a left femoral to anterior tibial bypass by Dr.Cain vein and vascular. He also has severe lymphedema with chronic skin changes related to that on the left leg. The most problematic area that was new today was on the left medial great toe. This was apparently a small area last week there was purulent drainage which our intake nurse cultured. Also areas on the left medial foot and heel left lateral foot. He has 2 areas on the left medial calf left lateral calf in the setting of the severe lymphedema. 07/13/2020 on evaluation today patient appears to be doing better in my opinion compared to his last visit. The good news is there is no signs of active infection systemically and locally I do not see any signs of infection either. He did have an x-ray which was negative that is great news he had a culture which showed MRSA but at the same time he is been on the doxycycline which has helped. I do think we may want to extend this for 7 additional days 1/25; patient admitted to the clinic a few weeks ago. 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 We have been putting him under compression his edema control is a lot better but he is severe verricused skin on the left leg. He is really done quite well he still has an open area on the left medial calf and the left medial first metatarsal head. We have been using silver collagen on the leg silver alginate on the foot 07/27/2020 upon evaluation today patient appears to be doing decently well in regard to his wounds. He still has a lot of dry skin on the left leg. Some of this is starting to peel back and I think he may be able to have them out by removing some that today. Fortunately there is no signs of active infection at this time on the left leg although on the right leg he does appear to have swelling and erythema as well as some mild warmth to touch. This does have  been concerned about the possibility of cellulitis although within the differential diagnosis I do think that potentially a DVT has to be at least considered. We need to rule that out before proceeding would just call in the cellulitis. Especially since he is having pain in the posterior aspect of his calf muscle. 2/8; the patient had seen sparingly. He has severe skin changes of chronic lymphedema in the left leg thickened hyperkeratotic verrucous skin. He has an open wound on the medial part of the left first met head left mid tibia. He also has a rim of nonepithelialized skin in the anterior mid tibia. He brought in the AmLactin lotion that was been prescribed although I am not sure under compression and its utility. There concern about cellulitis on the right lower leg the last time he was here. He was put on on antibiotics. His DVT rule out was negative. The right leg looks fine he is using his stocking on this area 08/10/2020 upon evaluation today patient appears to be doing well with regard to his leg currently. He has been tolerating the dressing changes without complication. Fortunately there is no signs of active infection which is great news. Overall very pleased with where things stand. 2/22; the patient still has an area on the medial part of the left first met his head. This looks better than when I last saw this earlier this month he has a rim of epithelialization but still some surface debris. Mostly everything on the left leg is healed. There is still a vulnerable in the left mid tibia area. 08/30/2020 upon evaluation today patient appears to be doing much better in regard to his wounds on his foot. 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 Nonetheless I think we will address that today I Minna send in a prescription for him in that regard. Overall  though there does not appear to be any signs of significant worsening. 09/07/2020 on evaluation today patient's wounds over his left foot appear to be doing excellent. I do not see any signs of infection there is some callus buildup this can require debridement for certain but overall I feel like he is managing quite nicely. He still using the AmLactin cream which has been beneficial for him as well. 3/22; left foot wound is closed. There is no open area here. He is using ammonium lactate lotion to the lower extremities to help exfoliate dry cracked skin. He has compression stockings from  elastic therapy in [place]. The wound on the medial part of his left first met head is healed today. READMISSION 04/12/2021 Mr. Baumgardner is a patient we know fairly well he had a prolonged stay in clinic in 2019 with wounds on his left lateral and left anterior lower extremity in the setting of chronic venous insufficiency. More recently he was here earlier this year with predominantly an area on his left foot first metatarsal head plantar and he says the plantar foot broke down on its not long after we discharged him but he did not come back here. The last few months areas of broken down on his left anterior and again the left lateral lower extremity. The leg itself is very swollen chronically enlarged a lot of hyperkeratotic dry Berry Q skin in the left lower leg. His edema extends well into the thigh. He was seen by Dr. Donzetta Matters. He had ABIs on 03/02/2021 showing an ABI on the right of 1 with a TBI of 0.72 his ABI in the left at 1.09 TBI of 0.99. Monophasic and biphasic waveforms on the right. On the left monophasic waveforms were noted he went on to have an angiogram on 03/27/2021 this showed the aortic aortic and iliac segments were free of flow-limiting stenosis the left common femoral vein to evaluate the left femoral to anterior tibial artery bypass was unobstructed the bypass was patent without any areas of  stenosis. We discharged the patient in bilateral juxta lite stockings but very clearly that was not sufficient to control the swelling and maintain skin integrity. He is clearly going to need compression pumps. 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 This is fortunate because he is on his feet for long periods of time. 10/27; patient comes in with our intake nurse reporting copious amount of green drainage from the left anterior mid tibia the left dorsal foot and to a lesser extent the left medial mid tibia. We left the compression wrap on all week for the amount of edema in his left leg is quite a bit better. We use silver alginate as the primary dressing 11/3; edema control is good. Left anterior lower leg left medial lower leg and the plantar first metatarsal head. The left anterior lower leg required debridement. Deep tissue culture I did of this wound showed MRSA I put him on 10 days of doxycycline which she will start today. We have him in compression wraps. He has a security card and [employer] however he is retiring on November 15. We will need to then get him into a better offloading boot for the left foot perhaps a total contact cast 11/10; edema control is quite good. Left anterior and left medial lower leg wounds in the setting of chronic venous insufficiency and lymphedema. He also has a substantial area over the left plantar first metatarsal head. 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 He has significant hypergranulation on the left plantar foot wound. The patient is a diabetic but he does not have significant PAD 11/17; edema control is quite good. Left anterior and left medial lower leg wounds look better. The really concerning area remains the area on the left plantar [name], HAIG ([id number]) [phone].pdf Page  11 of 17 first metatarsal head. He has a rim of epithelialization. He has been using a surgical shoe  The patient is now retired from a a [employer] I have gone over with him the need to offload this area aggressively. Starting today with a forefoot off loader but . possibly a total contact cast. 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 Arrives with a very significant odor from I think all of the wounds on the left leg and the left foot. Because of this I did not put a total contact cast on him today but will could still consider this. His wife was having cataract surgery which is the reason he missed the appointment 12/6. I saw this man 5 days ago with a swelling below the popliteal fossa. I thought he actually might have a Baker's cyst however the DVT rule out study that we could arrange right away was negative the technician told me this was not a ruptured Baker's cyst. We attempted to get this aspirated by under ultrasound guidance in interventional radiology however all they did was an ultrasound however it shows an extensive fluid collection 62 x 8 x 9.4 in the left thigh and left calf. The patient states he thinks this started 8 days ago or so but he really is not complaining of any pain, fever or systemic symptoms. He has not ha 12/20; after some difficulty I managed to get the patient into see Dr. Donzetta Matters. Eventually he was taken into the hospital and had a drain put in the fluid collection below his left knee posteriorly extending into the posterior thigh. He still has the drain in place. Culture of this showed moderate staff aureus few Morganella and few Klebsiella he is now on doxycycline and ciprofloxacin as suggested by infectious disease he is on this for a month. The drain will remain in place until it stops draining 12/29; he comes in today with the 1 wound on his left leg  and the area on the left plantar first met head significantly smaller. Both look healthy. He still has the drain in the left leg. He says he has to change this daily. Follows up with Dr. Donzetta Matters on January 11. 06/29/2021; the wounds that I am following on the left leg and left first met head continued to be quite healthy. However the area where his inferior drain is in place had copious amounts of drainage which was green in color. The wound here is larger. Follows up with Dr. Gwenlyn Saran of vein and vascular his surgeon next week as well as infectious disease. He remains on ciprofloxacin and doxycycline. He is not complaining of excessive pain in either one of the drain areas 1/12; the patient saw vascular surgery and infectious disease. Vascular surgery has left the drain in place as there was still some notable drainage still see him back in 2 weeks. Dr. Velna Ochs stop the doxycycline and ciprofloxacin and I do not believe he follows up with them at this point. Culture I did last week showed both doxycycline resistant MRSA and Pseudomonas not sensitive to ciprofloxacin although only in rare titers 1/19; the patient's wound on the left anterior lower leg is just about healed. We have continued healing of the area that was medially on the left leg. Left first plantar metatarsal head continues to get smaller. The major problem here is his 2 drain sites 1 on the left upper calf and lateral thigh. There is purulent drainage still from the  left lateral thigh. I gave him antibiotics last week but we still have recultured. He has the drain in the area I think this is eventually going to have to come out. I suspect there will be a connecting wound to heal here perhaps with improved VAc 1/26; the patient had his drain removed by vein and vascular on 1/25/. This was a large pocket of fluid in his left thigh that seem to tunnel into his left upper calf. 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 His mention  his Penrose drain was removed today. He now has a tunneling wound on his left calf and left thigh. Both of these probe widely towards each other although I cannot really prove that they connect. Both wounds on his lower leg anteriorly are closed and his area over the first metatarsal head on his right foot continues to improve. We are using Hydrofera Blue here. He also saw infectious disease culture of the abscess they noted was polymicrobial with MRSA, Morganella and Klebsiella he was treated with doxycycline and ciprofloxacin for 4 weeks ending on 07/03/2021. They did not recommend any further antibiotics. Notable that while he still had the Penrose drain in place last week he had purulent drainage coming out of the inferior IandD site this grew [facility], MRSA and Pseudomonas but there does not appear to be any active infection in this area today with the drain out and he is not systemically unwell 2/2; with regards to the drain sites the superior one on the thigh actually is closed down the one on the upper left lateral calf measures about 8 and half centimeters which is an improvement seems to be less prominent although still with a lot of drainage. The only remaining wound is over the first metatarsal head on the left foot and this looks to be continuing to improve with Hydrofera Blue. 2/9; the area on his plantar left foot continues to contract. Callus around the wound edge. The drain sites specifically have not come down in depth. We put the wound VAC on Monday he changed the canister late last night our intake nurse reported a pocket of fluid perhaps caused by our compression wraps 2/16; continued improvement in left foot plantar wound. drainage site in the calf is not improved in terms of depth (wound vac) 2/23; continued improvement in the left foot wound over the first metatarsal head. With regards to the drain sites the area on his thigh laterally is healed however the open area on his  calf is small in terms of circumference by still probes in by about 15 cm. Within using the wound VAC. Hydrofera Blue on his foot 08/24/2021: The left first metatarsal head wound continues to improve. The wound bed is healthy with just some surrounding callus. Unfortunately the open drain site on his calf remains open and tunnels at least 15 cm (the extent of a Q-tip). This is despite several weeks of wound VAC treatment. Based on reading back through the notes, there has been really no significant change in the depth of the wound, although the orifice is smaller and the more cranial wound on his thigh has closed. I suspect the tunnel tracks nearly all the way to this location. 08/31/2021: Continued improvement in the left first metatarsal head wound. There has been absolutely no improvement to the long tunnel from his open drain site on his calf. We have tried to get him into see vascular surgery sooner to consider the possibility of  simply filleting the tract open and allowing it to heal from the bottom up, likely with a wound VAC. They have not yet scheduled a sooner appointment than his current mid April 09/14/2021: He was seen by vascular surgery and they took him to the operating room last week. They opened a portion of the tunnel, but did not extend the entire length of the known open subcutaneous tract. I read Dr. Claretha Cooper operative note and it is not clear from that documentation why only a portion of the tract was opened. The heaped up granulation tissue was curetted and removed from at least some portion of the tract. They did place a wound VAC and applied an Unna boot to the leg. The ulcer on his left first metatarsal head is smaller today. The bed looks good and there is just a small amount of surrounding callus. 09/21/2021: The ulcer on his left first metatarsal head looks to be stalled. There is some callus surrounding the wound but the wound bed itself does not appear particularly dynamic. The  tunnel tract on his lateral left leg seems to be roughly the same length or perhaps slightly smaller but the wound bed appears healthy with good granulation tissue. He opened up a new wound on his medial thigh and the site of a prior surgical incision. He says that he did this unconsciously in his sleep by scratching. 09/28/2021: Unfortunately, the ulcer on his left first metatarsal head has extended underneath the callus toward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 Description L97.528 Non-pressure chronic ulcer of other part of left foot with other specified severity Electronic Signature(s) Signed: 04/05/2022 2:56:12 PM By: Fredirick Maudlin MD FACS Entered By: Fredirick Maudlin on 04/05/2022 14:56:11

## 2022-04-05 NOTE — Progress Notes (Signed)
SIRAJ, DERMODY ([id number]) [id number] Page 1 of 7 Visit Report for 04/05/2022 Arrival Information Details Patient Name: Date of Service: [name], [name] 04/05/2022 1:15 PM Medical Record Number: [id number] Patient Account Number: [id number] Date of Birth/Sex: Treating RN: 12/07/1950 (71 y.o. [name] Primary Care Yasmene Salomone: [name] Other Clinician: Referring Rylin Seavey: Treating Tabbitha Janvrin/Extender: [name] in Treatment: 29 Visit Information History Since Last Visit Added or deleted any medications: No Patient Arrived: Ambulatory Any new allergies or adverse reactions: No Arrival Time: 13:44 Had a fall or experienced change in No Accompanied By: self activities of daily living that may affect Transfer Assistance: None risk of falls: Patient Identification Verified: Yes Signs or symptoms of abuse/neglect since last visito No Patient Requires Transmission-Based Precautions: No Hospitalized since last visit: No Patient Has Alerts: Yes Implantable device outside of the clinic excluding No cellular tissue based products placed in the center since last visit: Has Dressing in Place as Prescribed: Yes Has Compression in Place as Prescribed: Yes Pain Present Now: No Electronic Signature(s) Signed: 04/05/2022 5:50:33 PM By: Dellie Catholic RN Entered By: Dellie Catholic on 04/05/2022 13:44:31 -------------------------------------------------------------------------------- Encounter Discharge Information Details Patient Name: Date of Service: [name] Garfinkel. 04/05/2022 1:15 PM Medical Record Number: [id number] Patient Account Number: [id number] Date of Birth/Sex: Treating RN: December 12, 1950 (71 y.o. [name] Primary Care Lasaro Primm: [name] Other Clinician: Referring Tura Roller: Treating Eura Radabaugh/Extender: [name] in Treatment: 62 Encounter Discharge Information Items Post Procedure  Vitals Discharge Condition: Stable Temperature (F): 98.5 Ambulatory Status: Ambulatory Pulse (bpm): 66 Discharge Destination: Home Respiratory Rate (breaths/min): 18 Transportation: Private Auto Blood Pressure (mmHg): 162/83 Accompanied By: self Schedule Follow-up Appointment: Yes Clinical Summary of Care: Patient Declined Electronic Signature(s) Signed: 04/05/2022 5:50:33 PM By: Dellie Catholic RN Entered By: Dellie Catholic on 04/05/2022 17:43:49 [place], Arnette Norris ([id number]) [id number] Page 2 of 7 -------------------------------------------------------------------------------- Lower Extremity Assessment Details Patient Name: Date of Service: [name], [name] 04/05/2022 1:15 PM Medical Record Number: [id number] Patient Account Number: [id number] Date of Birth/Sex: Treating RN: 1950-11-30 (71 y.o. [name] Primary Care Kyri Shader: [name] Other Clinician: Referring Gaile Allmon: Treating Lima Chillemi/Extender: [name] in Treatment: 51 Edema Assessment Assessed: [name]: No] [name]: No] Edema: [Left: Ye] [Right: s] Calf Left: Right: Point of Measurement: 41 cm From Medial Instep 44.9 cm Ankle Left: Right: Point of Measurement: 10 cm From Medial Instep 25 cm Vascular Assessment Pulses: Dorsalis Pedis Palpable: [Left:Yes] Electronic Signature(s) Signed: 04/05/2022 5:50:33 PM By: Dellie Catholic RN Entered By: Dellie Catholic on 04/05/2022 13:59:08 -------------------------------------------------------------------------------- Multi Wound Chart Details Patient Name: Date of Service: [name] Garfinkel. 04/05/2022 1:15 PM Medical Record Number: [id number] Patient Account Number: [id number] Date of Birth/Sex: Treating RN: [birth year] (71 y.o. M) Primary Care Brixton Schnapp: [name] Other Clinician: Referring Charelle Petrakis: Treating Orva Gwaltney/Extender: [name] in Treatment: 41 Vital  Signs Height(in): 53 Pulse(bpm): 55 Weight(lbs): 238 Blood Pressure(mmHg): 162/83 Body Mass Index(BMI): 30.6 Temperature(F): 98.5 Respiratory Rate(breaths/min): 18 [22:Photos: No Photos Left, Lateral Lower Leg Wound Location: Bump Wounding Event: Cyst Primary Etiology: Glaucoma, Sleep Apnea, Comorbid History: Hypertension, Peripheral Arterial Disease, Peripheral Venous Disease, Disease, Peripheral Venous Disease, Type II  Diabetes, Gout, Osteoarthritis, Type II Diabetes, Gout, Osteoarthritis,] [27:No Photos Left, Medial Foot Blister Diabetic Wound/Ulcer of the Lower Extremity Glaucoma, Sleep Apnea, Hypertension, Peripheral Arterial] [N/A:N/A N/A N/A N/A N/A] AC, COLAN ([id number]) [22:Neuropathy 06/03/2021 Date Acquired: 36 Weeks of Treatment: Open Wound Status: No Wound Recurrence: Yes Clustered  Wound: 3 Clustered Quantity: 5.2x1.2x0.1 Measurements L x W x D (cm) 4.901 A (cm) : rea 0.49 Volume (cm) : -197.20% % Reduction in  Area: 62.90% % Reduction in Volume: Full Thickness With Exposed Support Grade 1 Classification: Structures Medium Exudate A mount: Serosanguineous Exudate Type: red, brown Exudate Color: Fibrotic scar, thickened scar Wound Margin: Large (67-100%)  Granulation A mount: Red, Pale Granulation Quality: Small (1-33%) Necrotic A mount: Fat Layer (Subcutaneous Tissue): Yes Fat Layer (Subcutaneous Tissue): Yes N/A Exposed Structures: Fascia: No Tendon: No Muscle: No Joint: No Bone: No Medium (34-66%)  Epithelialization: Debridement - Selective/Open Wound Debridement - Selective/Open Wound N/A Debridement: Pre-procedure Verification/Time Out 14:20 Taken: N/A Tissue Debrided: Non-Viable Tissue Level: 6.24 Debridement A (sq cm): rea Curette Instrument:  Minimum Bleeding: Pressure Hemostasis A chieved: 0 Procedural Pain: 0 Post Procedural Pain: Procedure was tolerated well Debridement Treatment Response: 5.2x1.2x0.1 Post Debridement Measurements L x W x D (cm) 0.49 Post Debridement  Volume: (cm)  Scarring: Yes Periwound Skin Texture: Dry/Scaly: Yes Periwound Skin Moisture: No Abnormalities Noted Periwound Skin Color: No Abnormality Temperature: Debridement Procedures Performed:] [27:Neuropathy 02/01/2022 9 Open No No N/A 1x1.8x0.1 1.414 0.141  -38.50% -38.20% Medium Serosanguineous red, brown Distinct, outline attached Large (67-100%) Red Small (1-33%) Fascia: No Tendon: No Muscle: No Joint: No Bone: No Small (1-33%) 14:20 Callus, Slough Non-Viable Tissue 1.8 Curette Minimum Pressure 0 0  Procedure was tolerated well 1x1.8x0.1 0.141 Callus: Yes Dry/Scaly: Yes Maceration: No No Abnormalities Noted No Abnormality Debridement T Contact Cast otal] [[id number] Page 3 of 7 N/A N/A N/A N/A N/A N/A N/A N/A N/A N/A  N/A N/A N/A N/A N/A N/A N/A N/A N/A N/A N/A N/A N/A N/A N/A N/A N/A N/A N/A N/A N/A N/A N/A N/A N/A N/A N/A] Treatment Notes Electronic Signature(s) Signed: 04/05/2022 2:35:29 PM By: Fredirick Maudlin MD FACS Entered By: Fredirick Maudlin on 04/05/2022 14:35:29 -------------------------------------------------------------------------------- Pain Assessment Details Patient Name: Date of Service: [name] Garfinkel. 04/05/2022 1:15 PM Medical Record Number: [id number] Patient Account Number: [id number] Date of Birth/Sex: Treating RN: [birth year] (71 y.o. [name] Primary Care Shaily Librizzi: [name] Other Clinician: Referring Reniyah Gootee: Treating Kayliee Atienza/Extender: [name] in Treatment: 14 Active Problems Location of Pain Severity and Description of Pain Patient Has Paino No Site Locations [name], [name] ([id number]) [phone].pdf Page 4 of 7 Pain Management and Medication Current Pain Management: Electronic Signature(s) Signed: 04/05/2022 5:50:33 PM By: Dellie Catholic RN Entered By: Dellie Catholic on 04/05/2022  14:02:25 -------------------------------------------------------------------------------- Wound Assessment Details Patient Name: Date of Service: [name], [name] 04/05/2022 1:15 PM Medical Record Number: [id number] Patient Account Number: [id number] Date of Birth/Sex: Treating RN: Feb 06, 1951 (71 y.o. [name] Primary Care Demarius Archila: [name] Other Clinician: Referring Davaughn Hillyard: Treating Leira Regino/Extender: [name] in Treatment: 31 Wound Status Wound Number: 22 Primary Cyst Etiology: Wound Location: Left, Lateral Lower Leg Wound Open Wounding Event: Bump Status: Date Acquired: 06/03/2021 Comorbid Glaucoma, Sleep Apnea, Hypertension, Peripheral Arterial Disease, Weeks Of Treatment: 43 History: Peripheral Venous Disease, Type II Diabetes, Gout, Osteoarthritis, Clustered Wound: Yes Neuropathy Wound Measurements Length: (cm) Width: (cm) Depth: (cm) Clustered Quantity: Area: (cm) Volume: (cm) 5.2 % Reduction in Area: -197.2% 1.2 % Reduction in Volume: 62.9% 0.1 Epithelialization: Medium (34-66%) 3 Tunneling: No 4.901 Undermining: No 0.49 Wound Description Classification: Full Thickness With Exposed Suppo Wound Margin: Fibrotic scar, thickened scar Exudate Amount: Medium Exudate Type: Serosanguineous Exudate Color: red, brown rt Structures Foul Odor After Cleansing: No Slough/Fibrino Yes Wound  Bed Granulation Amount: Large (67-100%) Exposed Structure Granulation Quality: Red, Pale Fascia Exposed: No Necrotic Amount: Small (1-33%) Fat Layer (Subcutaneous Tissue) Exposed: Yes Necrotic Quality: Adherent Slough Tendon Exposed: No Muscle Exposed: No Joint Exposed: No Bone Exposed: No [name] ([id number]) [id number] Page 5 of 7 Periwound Skin Texture Texture Color No Abnormalities Noted: No No Abnormalities Noted: Yes Scarring: Yes Temperature / Pain Temperature: No Abnormality Moisture No  Abnormalities Noted: No Dry / Scaly: Yes Treatment Notes Wound #22 (Lower Leg) Wound Laterality: Left, Lateral Cleanser Soap and Water Discharge Instruction: May shower and wash wound with dial antibacterial soap and water prior to dressing change. Wound Cleanser Discharge Instruction: Cleanse the wound with wound cleanser prior to applying a clean dressing using gauze sponges, not tissue or cotton balls. Peri-Wound Care Sween Lotion (Moisturizing lotion) Discharge Instruction: Apply moisturizing lotion to the leg Topical Primary Dressing KerraCel Ag Gelling Fiber Dressing, 4x5 in (silver alginate) Discharge Instruction: Apply silver alginate to wound bed as instructed Secondary Dressing ABD Pad, 8x10 Discharge Instruction: Apply over primary dressing as directed. Secured With Elastic Bandage 4 inch (ACE bandage) Discharge Instruction: Secure with ACE bandage as directed. Compression Wrap Compression Stockings Add-Ons Electronic Signature(s) Signed: 04/05/2022 5:50:33 PM By: Dellie Catholic RN Entered By: Dellie Catholic on 04/05/2022 14:00:40 -------------------------------------------------------------------------------- Wound Assessment Details Patient Name: Date of Service: [name], [name] 04/05/2022 1:15 PM Medical Record Number: [id number] Patient Account Number: [id number] Date of Birth/Sex: Treating RN: 28-Feb-1951 (71 y.o. [name] Primary Care Glen Blatchley: [name] Other Clinician: Referring Kitzia Camus: Treating Zamorah Ailes/Extender: [name] in Treatment: 37 Wound Status Wound Number: 27 Primary Diabetic Wound/Ulcer of the Lower Extremity Etiology: Wound Location: Left, Medial Foot Wound Open Wounding Event: Blister Status: Date Acquired: 02/01/2022 Comorbid Glaucoma, Sleep Apnea, Hypertension, Peripheral Arterial Disease, Weeks Of Treatment: 9 History: Peripheral Venous Disease, Type II Diabetes, Gout,  Osteoarthritis, Clustered Wound: No Neuropathy Wound Measurements Length: (cm) 1 Deoliveira, Arnette Norris ([id number]) Width: (cm) 1.8 Depth: (cm) 0.1 Area: (cm) 1.414 Volume: (cm) 0.141 % Reduction in Area: -38.5% [id number] Page 6 of 7 % Reduction in Volume: -38.2% Epithelialization: Small (1-33%) Tunneling: No Undermining: No Wound Description Classification: Grade 1 Wound Margin: Distinct, outline attached Exudate Amount: Medium Exudate Type: Serosanguineous Exudate Color: red, brown Foul Odor After Cleansing: No Slough/Fibrino No Wound Bed Granulation Amount: Large (67-100%) Exposed Structure Granulation Quality: Red Fascia Exposed: No Necrotic Amount: Small (1-33%) Fat Layer (Subcutaneous Tissue) Exposed: Yes Necrotic Quality: Adherent Slough Tendon Exposed: No Muscle Exposed: No Joint Exposed: No Bone Exposed: No Periwound Skin Texture Texture Color No Abnormalities Noted: No No Abnormalities Noted: Yes Callus: Yes Temperature / Pain Temperature: No Abnormality Moisture No Abnormalities Noted: No Dry / Scaly: Yes Maceration: No Treatment Notes Wound #27 (Foot) Wound Laterality: Left, Medial Cleanser Peri-Wound Care Sween Lotion (Moisturizing lotion) Discharge Instruction: Apply moisturizing lotion as directed Topical Primary Dressing Hydrofera Blue Classic Foam, 4x4 in Discharge Instruction: Moisten with saline prior to applying to wound bed Secondary Dressing Optifoam Non-Adhesive Dressing, 4x4 in Discharge Instruction: Apply over primary dressing cut to make foam donut Woven Gauze Sponges 2x2 in Discharge Instruction: Apply over primary dressing as directed. Secured With 19M Medipore H Soft Cloth Surgical T ape, 4 x 10 (in/yd) Discharge Instruction: Secure with tape as directed. Compression Wrap Compression Stockings Add-Ons TCC-EZ Cast Boot, [place] Discharge Instruction: SIZE 3 TCC Electronic Signature(s) Signed:  04/05/2022 5:50:33 PM By: Dellie Catholic RN Entered By: Dellie Catholic on 04/05/2022 14:01:40 Mcmains, Fritz Pickerel  W ([id number]) [id number] Page 7 of 7 -------------------------------------------------------------------------------- Vitals Details Patient Name: Date of Service: [name], [name] 04/05/2022 1:15 PM Medical Record Number: [id number] Patient Account Number: [id number] Date of Birth/Sex: Treating RN: 02-22-1951 (71 y.o. [name] Primary Care Jeanna Giuffre: [name] Other Clinician: Referring Deaisha Welborn: Treating Braxton Weisbecker/Extender: [name] in Treatment: 70 Vital Signs Time Taken: 13:45 Temperature (F): 98.5 Height (in): 74 Pulse (bpm): 66 Weight (lbs): 238 Respiratory Rate (breaths/min): 18 Body Mass Index (BMI): 30.6 Blood Pressure (mmHg): 162/83 Reference Range: 80 - 120 mg / dl Electronic Signature(s) Signed: 04/05/2022 5:50:33 PM By: Dellie Catholic RN Entered By: Dellie Catholic on 04/05/2022 14:04:20

## 2022-04-13 ENCOUNTER — Encounter (HOSPITAL_BASED_OUTPATIENT_CLINIC_OR_DEPARTMENT_OTHER): Payer: Medicare Other | Admitting: Internal Medicine

## 2022-04-13 DIAGNOSIS — I87322 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: Secondary | ICD-10-CM | POA: Diagnosis not present

## 2022-04-13 NOTE — Progress Notes (Signed)
[name], [name] ([id number]) [id number] Page 1 of 14 Visit Report for 04/13/2022 HPI Details Patient Name: Date of Service: [name], [name] 04/13/2022 8:15 A M Medical Record Number: [id number] Patient Account Number: [id number] Date of Birth/Sex: Treating RN: Jun 18, 1951 (71 y.o. M) Primary Care Provider: Jilda [name] Other Clinician: Referring Provider: Treating Provider/Extender: [name] in Treatment: 72 History of Present Illness HPI Description: 10/11/17; [name] is a 71 year old man who tells me that in 2015 he slipped down the latter traumatizing his left leg. He developed a wound in the same spot the area that we are currently looking at. He states this closed over for the most part although he always felt it was somewhat unstable. In 2016 he hit the same area with the door of his car had this reopened. He tells me that this is never really closed although sometimes an inflow it remains open on a constant basis. He has not been using any specific dressing to this except for topical antibiotics the nature of which were not really sure. His primary doctor did send him to see [name] of interventional cardiology. He underwent an angiogram on 08/06/17 and he underwent a PTA and directional atherectomy of the lesser distal SFA and popliteal arteries which resulted in brisk improvement in blood flow. It was noted that he had 2 vessel runoff through the anterior tibial and peroneal. He is also been to see vascular and interventional radiologist. He was not felt to have any significant superficial venous insufficiency. Presumably is not a candidate for any ablation. It was suggested he come here for wound care. The patient is a type II diabetic on insulin. He also has a history of venous insufficiency. ABIs on the left were noncompressible in our clinic 10/21/17; patient we admitted to the clinic last week. He has a fairly large chronic  ulcer on the left lateral calf in the setting of chronic venous insufficiency. We put Iodosorb on him after an aggressive debridement and 3 layer compression. He complained of pain in his ankle and itching with is skin in fact he scratched the area on the medial calf superiorly at the rim of our wraps and he has 2 small open areas in that location today which are new. I changed his primary dressing today to silver collagen. As noted he is already had revascularization and does not have any significant superficial venous insufficiency that would be amenable to ablation 10/28/17; patient admitted to the clinic 2 weeks ago. He has a smaller Wound. Scratch injury from last week revealed. There is large wound over the tibial area. This is smaller. Granulation looks healthy. No need for debridement. 11/04/17; the wound on the left lateral calf looks better. Improved dimensions. Surface of this looks better. We've been maintaining him and Kerlix Coban wraps. He finds this much more comfortable. Silver collagen dressing 11/11/17; left lateral Wound continues to look healthy be making progress. Using a #5 curet I removed removed nonviable skin from the surface of the wound and then necrotic debris from the wound surface. Surface of the wound continues to look healthy. He also has an open area on the left great toenail bed. We've been using topical antibiotics. 11/19/17; left anterior lateral wound continues to look healthy but it's not closed. He also had a small wound above this on the left leg Initially traumatic wounds in the setting of significant chronic venous insufficiency and stasis dermatitis 11/25/17; left anterior wounds superiorly is closed still a  small wound inferiorly. 12/02/17; left anterior tibial area. Arrives today with adherent callus. Post debridement clearly not completely closed. Hydrofera Blue under 3 layer compression. 12/09/17; left anterior tibia. Circumferential eschar however the wound  bed looks stable to improved. We've been using Hydrofera Blue under 3 layer compression 12/17/17; left anterior tibia. Apparently this was felt to be closed however when the wrap was taken off there is a skin tear to reopen wounds in the same area we've been using Hydrofera Blue under 3 layer compression 12/23/17 left anterior tibia. Not close to close this week apparently the [name] was stuck to this again. Still circumferential eschar requiring debridement. I put a contact layer on this this time under the Hydrofera Blue 12/31/17; left anterior tibia. Wound is better slight amount of hyper-granulation. Using Hydrofera Blue over Adaptic. 01/07/18; left anterior tibia. The wound had some surface eschar however after this was removed he has no open wound.he was already revascularized by [name] when he came to our clinic with atherectomy of the left SFA and popliteal artery. He was also sent to interventional radiology for venous reflux studies. He was not felt to have significant reflux but certainly has chronic venous changes of his skin with hemosiderin deposition around this area. He will definitely need to lubricate his skin and wear compression stocking and I've talked to him about this. READMISSION 05/26/2018 This is a now 71 year old man we cared for with traumatic wounds on his left anterior lower extremity. He had been previously revascularized during that admission by [name]. Apparently in follow-up [name] noted that he had deterioration in his arterial status. He underwent a stent placement in the distal left SFA on 04/22/2018. 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 He went back to the angiography suite on 04/30/2018 he underwent PTA and balloon angioplasty of the occluded left mid anterior tibial artery, thrombotic occlusion went from 100 to 0% which reconstitutes the posterior tibial artery. He had thrombectomy and aspiration of the peroneal artery. The stent  placed in the distal SFA left SFA was still occluded. 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 He has been recuperating at [place] home in [facility] after the most recent hospitalization. He is going home tomorrow. He tells me that 2 weeks ago he traumatized the tip of his left fifth toe. He came in urgently for our review of this. This was a history of before I noted that [name] had already noted dry gangrenous changes of the left fifth toe 06/09/2018; 2-week follow-up. I did contact [name] after his last appointment and he apparently saw 1 of Dr. Irven Shelling colleagues the next day. He does not follow-up with [name] himself until Thursday of this week. 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 Nevertheless there is no evidence of infection no drainage and no pain. He had a new area that this week when we were signing him in today on the left anterior mid tibia area, this is in close proximity to the previous wound we have dealt with in this clinic. 06/23/2018; 2-week follow-up. I did not receive a recent note from [name] to review today. Our office is trying to obtain  this. He is apparently not planning to do further vascular interventions and wondered about compression to try and help with the patient's chronic venous insufficiency. However we are also concerned about the arterial flow. He arrives in clinic today with a new area on the left third toe. The areas on the calf/anterior tibia are close to closing. The left fifth toe is still mummified [facility] ([id number]) [id number] Page 2 of 14 using Betadine. -In reviewing things with the patient he has what sounds like  claudication with mild to moderate amount of activity. 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 I prescribed Levaquin over the phone while we attempted to arrange a plan of care. However the patient called yesterday to report he had low-grade fever and he came in today acutely. There is been a marked deterioration in the left third toe with spreading cellulitis up into the dorsal left foot. He was referred to the emergency room. Readmission: 06/29/2020 patient presents today for reevaluation here in our clinic he was previously treated by Dr. Dellia Nims at the latter part of 2019 in 2 the beginning of 2020. Subsequently we have not seen him since that time in the interim he did have evaluation with vein and vascular specialist specifically Dr. Anice Paganini who did perform quite extensive work for a left femoral to anterior tibial artery bypass. With that being said in the interim the patient has developed significant lymphedema and has wounds that he tells me have really never healed in regard to the incision site on the left leg. He also has multiple wounds on the feet for various reasons some of which is that he tends to pick at his feet. Fortunately there is no signs of active infection systemically at this time he does have some wounds that are little bit deeper but most are fairly superficial he seems to have good blood flow and overall everything appears to be healthy I see no bone exposed and no obvious signs of osteomyelitis. I do not know that he necessarily needs a x-ray at this point although that something we could consider depending on how things progress. The patient does have a history of lymphedema, diabetes, this is type II, chronic kidney disease stage III, hypertension, and history of peripheral vascular disease. 07/05/2020; patient admitted last week. Is a patient I remember from 2019 he had a spreading infection involving the left foot and we sent him to  the hospital. 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 He subsequently had a left femoral to anterior tibial bypass by [facility]. He also has severe lymphedema with chronic skin changes related to that on the left leg. The most problematic area that was new today was on the left medial great toe. This was apparently a small area last week there was purulent drainage which our intake nurse cultured. Also areas on the left medial foot and heel left lateral foot. He has 2 areas on the left medial calf left lateral calf in the setting of the severe lymphedema. 07/13/2020 on evaluation today patient appears to be doing better in my opinion compared to his last visit. The good news is there is no signs of active infection systemically and locally I do not see any signs of infection either. He did have an x-ray which was negative that is great news he had a culture which showed MRSA but at the same time he  is been on the doxycycline which has helped. I do think we may want to extend this for 7 additional days 1/25; patient admitted to the clinic a few weeks ago. 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 We have been putting him under compression his edema control is a lot better but he is severe verricused skin on the left leg. He is really done quite well he still has an open area on the left medial calf and the left medial first metatarsal head. We have been using silver collagen on the leg silver alginate on the foot 07/27/2020 upon evaluation today patient appears to be doing decently well in regard to his wounds. He still has a lot of dry skin on the left leg. Some of this is starting to peel back and I think he may be able to have them out by removing some that today. Fortunately there is no signs of active infection at this time on the left leg although on the right leg he does appear to have swelling  and erythema as well as some mild warmth to touch. This does have been concerned about the possibility of cellulitis although within the differential diagnosis I do think that potentially a DVT has to be at least considered. We need to rule that out before proceeding would just call in the cellulitis. Especially since he is having pain in the posterior aspect of his calf muscle. 2/8; the patient had seen sparingly. He has severe skin changes of chronic lymphedema in the left leg thickened hyperkeratotic verrucous skin. He has an open wound on the medial part of the left first met head left mid tibia. He also has a rim of nonepithelialized skin in the anterior mid tibia. He brought in the AmLactin lotion that was been prescribed although I am not sure under compression and its utility. There concern about cellulitis on the right lower leg the last time he was here. He was put on on antibiotics. His DVT rule out was negative. The right leg looks fine he is using his stocking on this area 08/10/2020 upon evaluation today patient appears to be doing well with regard to his leg currently. He has been tolerating the dressing changes without complication. Fortunately there is no signs of active infection which is great news. Overall very pleased with where things stand. 2/22; the patient still has an area on the medial part of the left first met his head. This looks better than when I last saw this earlier this month he has a rim of epithelialization but still some surface debris. Mostly everything on the left leg is healed. There is still a vulnerable in the left mid tibia area. 08/30/2020 upon evaluation today patient appears to be doing much better in regard to his wounds on his foot. 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 Nonetheless I think we will address that today  I Minna send in a prescription for him in that regard. Overall though there does not appear to be any signs of significant worsening. 09/07/2020 on evaluation today patient's wounds over his left foot appear to be doing excellent. I do not see any signs of infection there is some callus buildup this can require debridement for certain but overall I feel like he is managing quite nicely. He still using the  AmLactin cream which has been beneficial for him as well. 3/22; left foot wound is closed. There is no open area here. He is using ammonium lactate lotion to the lower extremities to help exfoliate dry cracked skin. He has compression stockings from elastic therapy in [place]. The wound on the medial part of his left first met head is healed today. READMISSION 04/12/2021 Mr. Brindisi is a patient we know fairly well he had a prolonged stay in clinic in 2019 with wounds on his left lateral and left anterior lower extremity in the setting of chronic venous insufficiency. More recently he was here earlier this year with predominantly an area on his left foot first metatarsal head plantar and he says the plantar foot broke down on its not long after we discharged him but he did not come back here. The last few months areas of broken down on his left anterior and again the left lateral lower extremity. The leg itself is very swollen chronically enlarged a lot of hyperkeratotic dry Berry Q skin in the left lower leg. His edema extends well into the thigh. He was seen by Dr. Donzetta Matters. He had ABIs on 03/02/2021 showing an ABI on the right of 1 with a TBI of 0.72 his ABI in the left at 1.09 TBI of 0.99. Monophasic and biphasic waveforms on the right. On the left monophasic waveforms were noted he went on to have an angiogram on 03/27/2021 this showed the aortic aortic and iliac segments were free of flow-limiting stenosis the left common femoral vein to evaluate the left femoral to anterior tibial artery bypass  was unobstructed the bypass was patent without any areas of stenosis. We discharged the patient in bilateral juxta lite stockings but very clearly that was not sufficient to control the swelling and maintain skin integrity. He is clearly going to need compression pumps. 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 This is fortunate because he is on his feet for long periods of time. 10/27; patient comes in with our intake nurse reporting copious amount of green drainage from the left anterior mid tibia the left dorsal foot and to a lesser extent the left medial mid tibia. We left the compression wrap on all week for the amount of edema in his left leg is quite a bit better. We use silver alginate as the primary dressing 11/3; edema control is good. Left anterior lower leg left medial lower leg and the plantar first metatarsal head. The left anterior lower leg required debridement. Deep tissue culture I did of this wound showed MRSA I put him on 10 days of doxycycline which she will start today. We have him in compression wraps. He has a security [name] and [employer] however he is retiring on November 15. We will need to then get him into a better offloading boot for the left foot perhaps a total contact cast 11/10; edema control is quite good. Left anterior and left medial lower leg wounds in the setting of chronic venous insufficiency and lymphedema. He also has a substantial area over the left plantar first metatarsal head. 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 He has significant hypergranulation on the left plantar foot wound. The patient is a diabetic but he does not have significant PAD 11/17; edema control is quite good. Left anterior and left  medial lower leg wounds look better. The really concerning area remains the area on the left plantar [name], [name] ([id number]) [id number] Page 3 of 14 first metatarsal head. He has a rim of epithelialization. 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 Starting today with a forefoot off loader but . possibly a total contact cast. 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 Arrives with a very significant odor from I think all of the wounds on the left leg and the left foot. Because of this I did not put a total contact cast on him today but will could still consider this. His wife was having cataract surgery which is the reason he missed the appointment 12/6. I saw this man 5 days ago with a swelling below the popliteal fossa. I thought he actually might have a Baker's cyst however the DVT rule out study that we could arrange right away was negative the technician told me this was not a ruptured Baker's cyst. We attempted to get this aspirated by under ultrasound guidance in interventional radiology however all they did was an ultrasound however it shows an extensive fluid collection 62 x 8 x 9.4 in the left thigh and left calf. The patient states he thinks this started 8 days ago or so but he really is not complaining of any pain, fever or systemic symptoms. He has not ha 12/20; after some difficulty I managed to get the patient into see Dr. Donzetta Matters. Eventually he was taken into the hospital and had a drain put in the fluid collection below his left knee posteriorly extending into the posterior thigh. He still has the drain in place. Culture of this showed moderate staff aureus few Morganella and few Klebsiella he is now on doxycycline and ciprofloxacin as suggested by infectious disease he is on this for a month. The drain will remain in place until it stops  draining 12/29; he comes in today with the 1 wound on his left leg and the area on the left plantar first met head significantly smaller. Both look healthy. He still has the drain in the left leg. He says he has to change this daily. Follows up with Dr. Donzetta Matters on January 11. 06/29/2021; the wounds that I am following on the left leg and left first met head continued to be quite healthy. However the area where his inferior drain is in place had copious amounts of drainage which was green in color. The wound here is larger. Follows up with Dr. Gwenlyn Saran of vein and vascular his surgeon next week as well as infectious disease. He remains on ciprofloxacin and doxycycline. He is not complaining of excessive pain in either one of the drain areas 1/12; the patient saw vascular surgery and infectious disease. Vascular surgery has left the drain in place as there was still some notable drainage still see him back in 2 weeks. Dr. Velna Ochs stop the doxycycline and ciprofloxacin and I do not believe he follows up with them at this point. Culture I did last week showed both doxycycline resistant MRSA and Pseudomonas not sensitive to ciprofloxacin although only in rare titers 1/19; the patient's wound on the left anterior lower leg is just about healed. We have continued healing of  the area that was medially on the left leg. Left first plantar metatarsal head continues to get smaller. The major problem here is his 2 drain sites 1 on the left upper calf and lateral thigh. There is purulent drainage still from the left lateral thigh. I gave him antibiotics last week but we still have recultured. He has the drain in the area I think this is eventually going to have to come out. I suspect there will be a connecting wound to heal here perhaps with improved VAc 1/26; the patient had his drain removed by vein and vascular on 1/25/. This was a large pocket of fluid in his left thigh that seem to tunnel into his left upper calf. He  had a previous left SFA to anterior tibial artery bypass. His mention his Penrose drain was removed today. He now has a tunneling wound on his left calf and left thigh. Both of these probe widely towards each other although I cannot really prove that they connect. Both wounds on his lower leg anteriorly are closed and his area over the first metatarsal head on his right foot continues to improve. We are using Hydrofera Blue here. He also saw infectious disease culture of the abscess they noted was polymicrobial with MRSA, Morganella and Klebsiella he was treated with doxycycline and ciprofloxacin for 4 weeks ending on 07/03/2021. They did not recommend any further antibiotics. Notable that while he still had the Penrose drain in place last week he had purulent drainage coming out of the inferior IandD site this grew [facility], MRSA and Pseudomonas but there does not appear to be any active infection in this area today with the drain out and he is not systemically unwell 2/2; with regards to the drain sites the superior one on the thigh actually is closed down the one on the upper left lateral calf measures about 8 and half centimeters which is an improvement seems to be less prominent although still with a lot of drainage. The only remaining wound is over the first metatarsal head on the left foot and this looks to be continuing to improve with Hydrofera Blue. 2/9; the area on his plantar left foot continues to contract. Callus around the wound edge. The drain sites specifically have not come down in depth. We put the wound VAC on Monday he changed the canister late last night our intake nurse reported a pocket of fluid perhaps caused by our compression wraps 2/16; continued improvement in left foot plantar wound. drainage site in the calf is not improved in terms of depth (wound vac) 2/23; continued improvement in the left foot wound over the first metatarsal head. With regards to the drain sites  the area on his thigh laterally is healed however the open area on his calf is small in terms of circumference by still probes in by about 15 cm. Within using the wound VAC. Hydrofera Blue on his foot 08/24/2021: The left first metatarsal head wound continues to improve. The wound bed is healthy with just some surrounding callus. Unfortunately the open drain site on his calf remains open and tunnels at least 15 cm (the extent of a Q-tip). This is despite several weeks of wound VAC treatment. Based on reading back through the notes, there has been really no significant change in the depth of the wound, although the orifice is smaller and the more cranial wound on his thigh has closed. I suspect the tunnel tracks nearly all the way to this location. 08/31/2021:  Continued improvement in the left first metatarsal head wound. There has been absolutely no improvement to the long tunnel from his open drain site on his calf. We have tried to get him into see vascular surgery sooner to consider the possibility of simply filleting the tract open and allowing it to heal from the bottom up, likely with a wound VAC. They have not yet scheduled a sooner appointment than his current mid April 09/14/2021: He was seen by vascular surgery and they took him to the operating room last week. They opened a portion of the tunnel, but did not extend the entire length of the known open subcutaneous tract. I read Dr. Claretha Cooper operative note and it is not clear from that documentation why only a portion of the tract was opened. The heaped up granulation tissue was curetted and removed from at least some portion of the tract. They did place a wound VAC and applied an Unna boot to the leg. The ulcer on his left first metatarsal head is smaller today. The bed looks good and there is just a small amount of surrounding callus. 09/21/2021: The ulcer on his left first metatarsal head looks to be stalled. There is some callus surrounding the  wound but the wound bed itself does not appear particularly dynamic. The tunnel tract on his lateral left leg seems to be roughly the same length or perhaps slightly smaller but the wound bed appears healthy with good granulation tissue. He opened up a new wound on his medial thigh and the site of a prior surgical incision. He says that he did this unconsciously in his sleep by scratching. 09/28/2021: Unfortunately, the ulcer on his left first metatarsal head has extended underneath the callus toward the dorsum of his foot. The medial thigh wounds are roughly the same. The tunnel on his lateral left leg continues to be problematic; it is longer than we are able to actually probe with a Q-tip. I am still not certain as to why Dr. Donzetta Matters did not open this up entirely when he took the patient to the operating room. We will likely be back in the same situation with just a small superficial opening in a long unhealed tract, as the open portion is granulating in nicely. 10/02/2021: The patient was initially scheduled for a nurse visit, but we are also applying a total contact cast today. The plantar foot wound looks clean without significant accumulated callus. We have been applying Prisma silver collagen to the site. 10/05/2021: The patient is here for his first total contact cast change. We have tried using gauze packing strips in the tunnel on his lateral leg wound, but this does not seem to be working any better than the white VAC foam. The foot ulcer looks about the same with minimal periwound callus. Medial thigh wound is clean with just some overlying eschar. 10/12/2021: The plantar foot wound is stable without any significant accumulation of periwound callus. The surface is viable with good granulation tissue. The DAO, MEMMOTT ([id number]) [id number] Page 4 of 14 medial thigh wounds are much smaller and are epithelializing. On the other hand, he had purulent drainage coming  from the tunnel on his lateral leg. He does go back to see Dr. Donzetta Matters next week and is planning to ask him why the wound tunnel was not completely opened at the time of his most recent operation. 10/19/2021: The plantar foot wound is markedly improved and has epithelial tissue coming through the surface. The medial thigh wounds  are nearly closed with just a tiny open area. He did see Dr. Donzetta Matters earlier this week and apparently they did discuss the possibility of opening the sinus tract further and enabling a wound VAC application. Apparently there are some limits as to what Dr. 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 I think if we could get the tract open to the level of the popliteal fossa, this would greatly aid in her ability to get this chart closed. That being said, however, today when I probed the tract with a Q-tip, I was not able to insert the entirety of the Q-tip as I have on previous occasions. The tunnel is shorter by about 4 cm. The surface is clean with good granulation tissue and no further episodes of purulent drainage. 10/30/2021: Last week, the patient underwent surgery and had the long tract in his leg opened. There was a rind that was debrided, according to the operative report. His medial thigh ulcers are closed. The plantar foot wound is clean with a good surface and some built up surrounding callus. 11/06/2021: The overall dimensions of the large wound on his lateral leg remain about the same, but there is good granulation tissue present and the tunneling is a little bit shorter. He has a new wound on his anterior tibial surface, in the same location where he had a similar lesion in the past. The plantar foot wound is clean with some buildup surrounding callus. Just toward the medial aspect of his foot, however, there is an area of darkening that once debrided, revealed another opening in the skin surface. 11/13/2021: The anterior tibial surface  wound is closed. The plantar foot wound has some surrounding callus buildup. The area of darkening that I debrided last week and revealed an opening in the skin surface has closed again. The tunnel in the large wound on his lateral leg has come in by about 3 cm. There is healthy granulation tissue on the entire wound surface. 11/23/2021: The patient was out of town last week and did wet-to-dry dressings on his large wound. 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 Unfortunately, he picked open the wound on his left medial thigh. He says that it was itching and he just could not stop scratching it until it was open again. The wound on his plantar foot is smaller and has not accumulated a tremendous amount of callus. The lateral leg wound is shallower and the tunnel has also decreased in depth. There is just a little bit of slough accumulation on the surface. 11/30/2021: Another portion of his left medial thigh has been opened up. All of these wounds are fairly superficial with just a little bit of slough and eschar accumulation. The wound on his plantar foot is almost closed with just a bit of eschar and periwound callus accumulation. The lateral leg wound is nearly flush with the surrounding skin and the tunnel is markedly shallower. 12/07/2021: There is just 1 open area on his left medial thigh. It is clean with just a little bit of perimeter eschar. The wound on his plantar foot continues to contract and just has some eschar and periwound callus accumulation. The lateral leg wound is closing at the more distal aspect and the tunnel is smaller. The surface is nearly flush with the surrounding skin and it has a good bed of granulation tissue. 12/14/2021: The thigh and foot wounds are closed. The  lateral leg wound has closed over approximately half of its length. The tunnel continues to contract and the surface is now flush with the  surrounding skin. The wound bed has robust granulation tissue. 12/22/2021: The thigh and foot wounds have reopened. The foot wound has a lot of callus accumulation around and over it. The thigh wound is tiny with just a little bit of slough in the wound bed. The lateral leg wound continues to contract. His vascular surgeon took the wound VAC off earlier in the week and the patient has been doing wet-to-dry dressings. There is a little slough accumulation on the surface. The tunnel is about 3 cm in depth at this point. 12/28/2021: The thigh wound is closed again. The foot wound has some callus that subsequently has peeled back exposing just a small slit of a wound. The lateral leg wound Is down to about half the size that it originally was and the tunnel is down to about half a centimeter in depth. 01/04/2022: The thigh wound remains closed. The foot wound has heavy callus overlying the wound site. Once this was debrided, the wound was found to be closed. The lateral leg wound is smaller again this week and very superficial. No tunnel could be identified. 01/12/2022: The thigh and foot wounds both remain closed. The lateral leg wound is now nearly flush with the skin surface. There is good granulation tissue present with a light layer of slough. 01/19/2022: Due to the way his wrap was placed, the patient did not change the dressing on his thigh at all and so the foam was saturated and his skin is macerated. There is a light layer of slough on the wound surface. The underlying granulation tissue is robust and healthy-appearing. He has heavy callus buildup at the site of his first metatarsal head wound which is still healed. 02/01/2022: He has been in silver alginate. When he removed the dressing from his thigh wound, however, some leg, superficially reopening a portion of the wound that had healed. In addition, underneath the callus at his left first metatarsal head, there appears to be a blister and the wound  appears to be open again. 02/08/2022: The lateral leg wound has contracted substantially. There is eschar and a light layer of slough present. He says that it is starting to pull and is uncomfortable. On inspection, there is some puckering of the scar and the eschar is quite dry; this may account for his symptoms. On his first metatarsal head, the wound is much smaller with just some eschar on the surface. The callus has not reaccumulated. He reports that he had a blister come up on his medial thigh wound at the distal aspect. It popped and there is now an opening in his skin again. Looking back through his [place] of wound photos, there is what looks like a permanent suture just deep to this location and it may be trying to erode through. We have been using silver alginate on his wounds. 02/15/2022: The lateral leg wound is about half the size it was last week. It is clean with just a little perimeter eschar and light slough. The wound on his first metatarsal head is about the same with heavy callus overlying it. The medial thigh wound is closed again. He does have some skin changes on the top of his foot that looks potentially yeast related. 02/22/2022: The skin on the top of his foot improved with the use of a topical antifungal. The lateral leg wound continues  to contract and is again smaller this week. There is a little bit of slough and eschar on the surface. The first metatarsal head wound is a little bit smaller but has reaccumulated a thick callus over the top. He decided to try to trim his toenail and ultimately took the entire nail off of his left great toe. 03/02/2022: His lateral leg wound continues to improve, as does the wound on his left great toe. Unfortunately, it appears that somehow his foot got wet and moisture seeped in through the opening causing his skin to lift. There is a large wound now overlying his first metatarsal on both the plantar, medial, and dorsal portion of his foot.  There is necrotic tissue and slough present underneath the shaggy macerated skin. 03/08/2022: The lateral leg wound is smaller again today. There is just a light layer of slough and eschar on the surface. The great toe wound is smaller again today. The first metatarsal wound is a little bit smaller today and does not look nearly as necrotic and macerated. There is still slough and nonviable tissue present. 03/15/2022: The lateral leg wound is narrower and just has a little bit of light slough buildup. The first metatarsal wound still has a fair amount of moisture affecting the periwound skin. The great toe wound is healed. 03/22/2022: The lateral leg wound is now isolated to just at the level of his knee. There is some eschar and slough accumulation. The first metatarsal head wound has epithelialized tremendously and is about half the size that it was last week. He still has some maceration on the top of his foot and a fungal odor is present. 03/29/2022: T oday the patient's foot was macerated, suggesting that the cast got wet. The patient has also been picking at his dry skin and has enlarged the wound on his left lateral leg. In the time between having his cast removed and my evaluation, he had picked more dry skin and opened up additional wounds on his Achilles area and dorsal foot. The plantar first metatarsal head wound, however, is smaller and clean with just macerated callus around the perimeter and light slough on the surface. The lateral leg wound measured a little bit larger but is also fairly clean with eschar and minimal slough. [name], [name] ([id number]) [id number] Page 5 of 14 04/02/2022: The patient had vascular studies done last Friday and so his cast was not applied. He is here today to have that done. 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 04/05/2022: Both wounds are smaller and quite clean. There is just a little biofilm on the lateral leg  wound. 10/20; the patient has a wound on the left lateral surgical incision at the level of his lateral knee this looks clean and improved. He is using silver alginate. He also has an area on his left medial foot for which she is using Hydrofera Blue under a total contact cast both wounds are measuring smaller Electronic Signature(s) Signed: 04/13/2022 4:47:23 PM By: Linton Ham MD Entered By: Linton Ham on 04/13/2022 09:58:46 -------------------------------------------------------------------------------- Physical Exam Details Patient Name: Date of Service: [name]. 04/13/2022 8:15 A M Medical Record Number: [id number] Patient Account Number: [id number] Date of Birth/Sex: Treating RN: [birth year] (71 y.o. M) Primary Care Provider: Jilda [name] Other Clinician: Referring Provider: Treating Provider/Extender: [name] in Treatment: 29 Constitutional Patient is hypertensive.. Pulse regular and within target range for patient.Marland Kitchen Respirations regular, non-labored and within target range.. Temperature is  normal and within the target range for the patient.Marland Kitchen Appears in no distress. Notes Wound exam; his wounds actually look quite good both 1 both of them. We will continue silver alginate to the wound on the left medial upper leg and continue with Hydrofera Blue on the foot wound. T contact cast applied in the standard fashion. otal Electronic Signature(s) Signed: 04/13/2022 4:47:23 PM By: Linton Ham MD Entered By: Linton Ham on 04/13/2022 10:00:02 -------------------------------------------------------------------------------- Physician Orders Details Patient Name: Date of Service: [name]. 04/13/2022 8:15 A M Medical Record Number: [id number] Patient Account Number: [id number] Date of Birth/Sex: Treating RN: 1950/09/14 (71 y.o. [name] Primary Care Provider: Jilda [name] Other Clinician: Referring Provider: Treating  Provider/Extender: [name] in Treatment: 108 Verbal / Phone Orders: No Diagnosis Coding ICD-10 Coding Code Description 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 I89.0 Lymphedema, not elsewhere classified I87.322 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verity L97.528 Non-pressure chronic ulcer of other part of left foot with other specified severity Follow-up Appointments ppointment in 1 week. - Dr Celine Ahr - Room 1 TCC Return A Anesthetic [name], [name] ([id number]) [id number] Page 6 of 14 Wound #22 Left,Lateral Lower Leg (In clinic) Topical Lidocaine 4% applied to wound bed Wound #27 Left,Medial Foot (In clinic) Topical Lidocaine 4% applied to wound bed Bathing/ Shower/ Hygiene May shower with protection but do not get wound dressing(s) wet. - Use a cast protector so you can shower without getting your cast wet Edema Control - Lymphedema / SCD / Other Elevate legs to the level of the heart or above for 30 minutes daily and/or when sitting, a frequency of: - throughout the day Avoid standing for long periods of time. Patient to wear own compression stockings every day. - on right leg; Moisturize legs daily. - Ammonium lactate to right leg daily Compression stocking or Garment 20-30 mm/Hg pressure to: - left leg daily Off-Loading Total Contact Cast to Left Lower Extremity - TCC left leg Other: - minimal weight bearing left foot Wound Treatment Wound #22 - Lower Leg Wound Laterality: Left, Lateral Cleanser: Soap and Water 3 x Per Week/30 Days Discharge Instructions: May shower and wash wound with dial antibacterial soap and water prior to dressing change. Cleanser: Wound Cleanser 3 x Per Week/30 Days Discharge Instructions: Cleanse the wound with wound  cleanser prior to applying a clean dressing using gauze sponges, not tissue or cotton balls. Peri-Wound Care: Sween Lotion (Moisturizing lotion) 3 x Per Week/30 Days Discharge Instructions: Apply moisturizing lotion to the leg Prim Dressing: KerraCel Ag Gelling Fiber Dressing, 4x5 in (silver alginate) 3 x Per Week/30 Days ary Discharge Instructions: Apply silver alginate to wound bed as instructed Secondary Dressing: ABD Pad, 8x10 (Generic) 3 x Per Week/30 Days Discharge Instructions: Apply over primary dressing as directed. Secured With: Elastic Bandage 4 inch (ACE bandage) 3 x Per Week/30 Days Discharge Instructions: Secure with ACE bandage as directed. Wound #27 - Foot Wound Laterality: Left, Medial Peri-Wound Care: Sween Lotion (Moisturizing lotion) 1 x Per Week Discharge Instructions: Apply moisturizing lotion as directed Prim Dressing: Hydrofera Blue Classic Foam, 4x4 in 1 x Per Week ary Discharge Instructions: Moisten with saline prior to applying to wound bed Secondary Dressing: Optifoam Non-Adhesive Dressing, 4x4 in 1 x Per Week Discharge Instructions: Apply over primary dressing cut to make foam donut Secondary Dressing: Woven Gauze  Sponges 2x2 in 1 x Per Week Discharge Instructions: Apply over primary dressing as directed. Secured With: 45M Medipore H Soft Cloth Surgical T ape, 4 x 10 (in/yd) 1 x Per Week Discharge Instructions: Secure with tape as directed. Add-Ons: TCC-EZ Cast Boot, Charcot,Large 1 x Per Week Discharge Instructions: SIZE 3 TCC Electronic Signature(s) Signed: 04/13/2022 11:14:03 AM By: Baruch Gouty RN, BSN Signed: 04/13/2022 4:47:23 PM By: Linton Ham MD Entered By: Baruch Gouty on 04/13/2022 08:50:36 -------------------------------------------------------------------------------- Problem List Details Patient Name: Date of Service: [name]. 04/13/2022 8:15 A Kathrin Penner ([id number]) [id number] Page 7 of  14 Medical Record Number: [id number] Patient Account Number: [id number] Date of Birth/Sex: Treating RN: October 19, 1950 (71 y.o. [name] Primary Care Provider: Jilda [name] Other Clinician: Referring Provider: Treating Provider/Extender: [name] in Treatment: 52 Active Problems ICD-10 Encounter Code Description Active Date MDM Diagnosis 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 04/12/2021 No Yes I89.0 Lymphedema, not elsewhere classified 04/12/2021 No Yes I87.322 Chronic venous hypertension (idiopathic) with inflammation of left lower 04/12/2021 No Yes extremity L97.828 Non-pressure chronic ulcer of other part of left lower leg with other specified 04/12/2021 No Yes severity L97.528 Non-pressure chronic ulcer of other part of left foot with other specified 04/12/2021 No Yes severity Inactive Problems ICD-10 Code Description Active Date Inactive Date E11.621 Type 2 diabetes mellitus with foot ulcer 04/12/2021 04/12/2021 E11.42 Type 2 diabetes mellitus with diabetic polyneuropathy 04/12/2021 04/12/2021 L02.416 Cutaneous abscess of left lower limb 06/13/2021 06/13/2021 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 04/13/2022 8:15 A M Medical Record Number: [id number] Patient Account Number: [id number] Date of Birth/Sex: Treating RN: 03-May-1951 (71 y.o. M) Primary Care Provider: Jilda [name] Other Clinician: Referring Provider: Treating Provider/Extender: [name] in Treatment: [address] [place] ([id number])  [phone].pdf Page 8 of 14 Subjective History of Present Illness (HPI) 10/11/17; Mr. Montanari is a 71 year old man who tells me that in 2015 he slipped down the latter traumatizing his left leg. He developed a wound in the same spot the area that we are currently looking at. He states this closed over for the most part although he always felt it was somewhat unstable. In 2016 he hit the same area with the door of his car had this reopened. He tells me that this is never really closed although sometimes an inflow it remains open on a constant basis. He has not been using any specific dressing to this except for topical antibiotics the nature of which were not really sure. His primary doctor did send him to see [name] of interventional cardiology. He underwent an angiogram on 08/06/17 and he underwent a PTA and directional atherectomy of the lesser distal SFA and popliteal arteries which resulted in brisk improvement in blood flow. It was noted that he had 2 vessel runoff through the anterior tibial and peroneal. He is also been to see vascular and interventional radiologist. He was not felt to have any significant superficial venous insufficiency. Presumably is not a candidate for any ablation. It was suggested he come here for wound care. The patient is a type II diabetic on insulin. He also has a history of venous insufficiency. ABIs on the left were noncompressible in our clinic  10/21/17; patient we admitted to the clinic last week. 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 We put Iodosorb on him after an aggressive debridement and 3 layer compression. He complained of pain in his ankle and itching with is skin in fact he scratched the area on the medial calf superiorly at the rim of our wraps and he has 2 small open areas in that location today which are new. I changed his primary dressing today to silver  collagen. As noted he is already had revascularization and does not have any significant superficial venous insufficiency that would be amenable to ablation 10/28/17; patient admitted to the clinic 2 weeks ago. He has a smaller Wound. Scratch injury from last week revealed. There is large wound over the tibial area. This is smaller. Granulation looks healthy. No need for debridement. 11/04/17; the wound on the left lateral calf looks better. Improved dimensions. Surface of this looks better. We've been maintaining him and Kerlix Coban wraps. He finds this much more comfortable. Silver collagen dressing 11/11/17; left lateral Wound continues to look healthy be making progress. Using a #5 curet I removed removed nonviable skin from the surface of the wound and then necrotic debris from the wound surface. Surface of the wound continues to look healthy. ooHe also has an open area on the left great toenail bed. We've been using topical antibiotics. 11/19/17; left anterior lateral wound continues to look healthy but it's not closed. ooHe also had a small wound above this on the left leg ooInitially traumatic wounds in the setting of significant chronic venous insufficiency and stasis dermatitis 11/25/17; left anterior wounds superiorly is closed still a small wound inferiorly. 12/02/17; left anterior tibial area. Arrives today with adherent callus. Post debridement clearly not completely closed. Hydrofera Blue under 3 layer compression. 12/09/17; left anterior tibia. Circumferential eschar however the wound bed looks stable to improved. We've been using Hydrofera Blue under 3 layer compression 12/17/17; left anterior tibia. Apparently this was felt to be closed however when the wrap was taken off there is a skin tear to reopen wounds in the same area we've been using Hydrofera Blue under 3 layer compression 12/23/17 left anterior tibia. Not close to close this week apparently the [facility] was stuck to this  again. Still circumferential eschar requiring debridement. I put a contact layer on this this time under the Hydrofera Blue 12/31/17; left anterior tibia. Wound is better slight amount of hyper-granulation. Using Hydrofera Blue over Adaptic. 01/07/18; left anterior tibia. The wound had some surface eschar however after this was removed he has no open wound.he was already revascularized by [name] when he came to our clinic with atherectomy of the left SFA and popliteal artery. He was also sent to interventional radiology for venous reflux studies. He was not felt to have significant reflux but certainly has chronic venous changes of his skin with hemosiderin deposition around this area. He will definitely need to lubricate his skin and wear compression stocking and I've talked to him about this. READMISSION 05/26/2018 This is a now 71 year old man we cared for with traumatic wounds on his left anterior lower extremity. He had been previously revascularized during that admission by [name]. Apparently in follow-up [name] noted that he had deterioration in his arterial status. He underwent a stent placement in the distal left SFA on 04/22/2018. 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 He went back to the angiography  suite on 04/30/2018 he underwent PTA and balloon angioplasty of the occluded left mid anterior tibial artery, thrombotic occlusion went from 100 to 0% which reconstitutes the posterior tibial artery. He had thrombectomy and aspiration of the peroneal artery. The stent placed in the distal SFA left SFA was still occluded. 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 He has been recuperating at [place] home in [facility]  after the most recent hospitalization. He is going home tomorrow. He tells me that 2 weeks ago he traumatized the tip of his left fifth toe. He came in urgently for our review of this. This was a history of before I noted that [name] had already noted dry gangrenous changes of the left fifth toe 06/09/2018; 2-week follow-up. I did contact [name] after his last appointment and he apparently saw 1 of Dr. Irven Shelling colleagues the next day. He does not follow-up with [name] himself until Thursday of this week. 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 Nevertheless there is no evidence of infection no drainage and no pain. He had a new area that this week when we were signing him in today on the left anterior mid tibia area, this is in close proximity to the previous wound we have dealt with in this clinic. 06/23/2018; 2-week follow-up. I did not receive a recent note from [name] to review today. Our office is trying to obtain this. He is apparently not planning to do further vascular interventions and wondered about compression to try and help with the patient's chronic venous insufficiency. However we are also concerned about the arterial flow. ooHe arrives in clinic today with a new area on the left third toe. The areas on the calf/anterior tibia are close to closing. The left fifth toe is still mummified using Betadine. -In reviewing things with the patient he has what sounds like claudication with mild to moderate amount of activity. 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 I prescribed Levaquin over the phone while we attempted to arrange a plan of care. However the patient called yesterday to report he had low-grade fever and he came in today acutely. There is been a marked deterioration in the left third toe with spreading cellulitis up into the dorsal left foot. He was referred to the emergency room. Readmission: 06/29/2020 patient  presents today for reevaluation here in our clinic he was previously treated by Dr. Dellia Nims at the latter part of 2019 in 2 the beginning of 2020. Subsequently we have not seen him since that time in the interim he did have evaluation with vein and vascular specialist specifically Dr. Anice Paganini who did perform quite extensive work for a left femoral to anterior tibial artery bypass. With that being said in the interim the patient has developed significant lymphedema and has wounds that he tells me have really never healed in regard to the incision site on the left leg. He also has multiple wounds on the feet for various reasons some of which is that he tends to pick at his feet. Fortunately there is no signs of active infection systemically at this time he does have some wounds that are little  bit deeper but most are fairly superficial he seems to have good blood flow and overall everything appears to be healthy I see no bone exposed and no obvious signs of osteomyelitis. I do not know that he necessarily needs a x-ray at this point although that something we could consider depending on how things progress. The patient does have a history of lymphedema, diabetes, this is type II, chronic kidney disease stage III, hypertension, and history of peripheral vascular disease. 07/05/2020; patient admitted last week. Is a patient I remember from 2019 he had a spreading infection involving the left foot and we sent him to the hospital. 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 He subsequently had a left femoral to anterior tibial bypass by [facility]. He also has severe lymphedema with chronic skin changes related to that on the left leg. The most problematic area that was new today was on the left medial great toe. This was apparently a small area last week there was purulent drainage which [name], [name] ([id number]) [id number]  Page 9 of 14 our intake nurse cultured. Also areas on the left medial foot and heel left lateral foot. He has 2 areas on the left medial calf left lateral calf in the setting of the severe lymphedema. 07/13/2020 on evaluation today patient appears to be doing better in my opinion compared to his last visit. The good news is there is no signs of active infection systemically and locally I do not see any signs of infection either. He did have an x-ray which was negative that is great news he had a culture which showed MRSA but at the same time he is been on the doxycycline which has helped. I do think we may want to extend this for 7 additional days 1/25; patient admitted to the clinic a few weeks ago. 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 We have been putting him under compression his edema control is a lot better but he is severe verricused skin on the left leg. He is really done quite well he still has an open area on the left medial calf and the left medial first metatarsal head. We have been using silver collagen on the leg silver alginate on the foot 07/27/2020 upon evaluation today patient appears to be doing decently well in regard to his wounds. He still has a lot of dry skin on the left leg. Some of this is starting to peel back and I think he may be able to have them out by removing some that today. Fortunately there is no signs of active infection at this time on the left leg although on the right leg he does appear to have swelling and erythema as well as some mild warmth to touch. This does have been concerned about the possibility of cellulitis although within the differential diagnosis I do think that potentially a DVT has to be at least considered. We need to rule that out before proceeding would just call in the cellulitis. Especially since he is having pain in the posterior aspect of his calf muscle. 2/8; the patient had seen sparingly. He  has severe skin changes of chronic lymphedema in the left leg thickened hyperkeratotic verrucous skin. He has an open wound on the medial part of the left first met head left mid tibia. He also has a rim of nonepithelialized skin in the anterior mid tibia.  He brought in the AmLactin lotion that was been prescribed although I am not sure under compression and its utility. There concern about cellulitis on the right lower leg the last time he was here. He was put on on antibiotics. His DVT rule out was negative. The right leg looks fine he is using his stocking on this area 08/10/2020 upon evaluation today patient appears to be doing well with regard to his leg currently. He has been tolerating the dressing changes without complication. Fortunately there is no signs of active infection which is great news. Overall very pleased with where things stand. 2/22; the patient still has an area on the medial part of the left first met his head. This looks better than when I last saw this earlier this month he has a rim of epithelialization but still some surface debris. Mostly everything on the left leg is healed. There is still a vulnerable in the left mid tibia area. 08/30/2020 upon evaluation today patient appears to be doing much better in regard to his wounds on his foot. 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 Nonetheless I think we will address that today I Minna send in a prescription for him in that regard. Overall though there does not appear to be any signs of significant worsening. 09/07/2020 on evaluation today patient's wounds over his left foot appear to be doing excellent. I do not see any signs of infection there is some callus buildup this can require debridement for certain but overall I feel like he is managing quite nicely. He still using the AmLactin cream  which has been beneficial for him as well. 3/22; left foot wound is closed. There is no open area here. He is using ammonium lactate lotion to the lower extremities to help exfoliate dry cracked skin. He has compression stockings from elastic therapy in [place]. The wound on the medial part of his left first met head is healed today. READMISSION 04/12/2021 Mr. Perrelli is a patient we know fairly well he had a prolonged stay in clinic in 2019 with wounds on his left lateral and left anterior lower extremity in the setting of chronic venous insufficiency. More recently he was here earlier this year with predominantly an area on his left foot first metatarsal head plantar and he says the plantar foot broke down on its not long after we discharged him but he did not come back here. The last few months areas of broken down on his left anterior and again the left lateral lower extremity. The leg itself is very swollen chronically enlarged a lot of hyperkeratotic dry Berry Q skin in the left lower leg. His edema extends well into the thigh. He was seen by Dr. Donzetta Matters. He had ABIs on 03/02/2021 showing an ABI on the right of 1 with a TBI of 0.72 his ABI in the left at 1.09 TBI of 0.99. Monophasic and biphasic waveforms on the right. On the left monophasic waveforms were noted he went on to have an angiogram on 03/27/2021 this showed the aortic aortic and iliac segments were free of flow-limiting stenosis the left common femoral vein to evaluate the left femoral to anterior tibial artery bypass was unobstructed the bypass was patent without any areas of stenosis. We discharged the patient in bilateral juxta lite stockings but very clearly that was not sufficient to control the  swelling and maintain skin integrity. He is clearly going to need compression pumps. 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 This is fortunate because he is on his feet for long periods  of time. 10/27; patient comes in with our intake nurse reporting copious amount of green drainage from the left anterior mid tibia the left dorsal foot and to a lesser extent the left medial mid tibia. We left the compression wrap on all week for the amount of edema in his left leg is quite a bit better. We use silver alginate as the primary dressing 11/3; edema control is good. Left anterior lower leg left medial lower leg and the plantar first metatarsal head. The left anterior lower leg required debridement. Deep tissue culture I did of this wound showed MRSA I put him on 10 days of doxycycline which she will start today. We have him in compression wraps. He has a security [name] and [employer] however he is retiring on November 15. We will need to then get him into a better offloading boot for the left foot perhaps a total contact cast 11/10; edema control is quite good. Left anterior and left medial lower leg wounds in the setting of chronic venous insufficiency and lymphedema. He also has a substantial area over the left plantar first metatarsal head. 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 He has significant hypergranulation on the left plantar foot wound. The patient is a diabetic but he does not have significant PAD 11/17; edema control is quite good. Left anterior and left medial lower leg wounds look better. The really concerning area remains the area on the left plantar first metatarsal head. He has a rim of epithelialization. 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 Starting today with a forefoot off loader but . possibly a total contact cast. 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 Arrives with a very significant odor from I think all of the wounds on the left leg and the left foot. Because of this I did not put a total contact cast on him today but will could still consider this. His wife was having cataract surgery which is the reason he missed the appointment 12/6. I saw this man 5 days ago with a swelling below the popliteal fossa. I thought he actually might have a Baker's cyst however the DVT rule out study that we could arrange right away was negative the technician told me this was not a ruptured Baker's cyst. We attempted to get this aspirated by under ultrasound guidance in interventional radiology however all they did was an ultrasound however it shows an extensive fluid collection 62 x 8 x 9.4 in the left thigh and left calf. The patient states he thinks this started 8 days ago or so but he really is not complaining of any pain, fever or systemic symptoms. He has not ha 12/20; after some difficulty I managed to get the patient into see Dr. Donzetta Matters. Eventually he was taken into the hospital and had a drain put in the fluid collection below  his left knee posteriorly extending into the posterior thigh. He still has the drain in place. Culture of this showed moderate staff aureus few Morganella and few Klebsiella he is now on doxycycline and ciprofloxacin as suggested by infectious disease he is on this for a month. The drain will remain in place until it stops draining 12/29; he comes in today with the 1 wound on his left leg and the area on the left plantar first met head significantly smaller. Both look healthy. He still has the drain in the left leg. He says he has to change this daily. Follows up with Dr. Donzetta Matters on January 11. [name], [name] ([id number]) [id number] Page 10 of 14 06/29/2021; the wounds that I am following on the left leg and left first met head continued to be quite healthy. However the area where his inferior drain is  in place had copious amounts of drainage which was green in color. The wound here is larger. Follows up with Dr. Gwenlyn Saran of vein and vascular his surgeon next week as well as infectious disease. He remains on ciprofloxacin and doxycycline. He is not complaining of excessive pain in either one of the drain areas 1/12; the patient saw vascular surgery and infectious disease. Vascular surgery has left the drain in place as there was still some notable drainage still see him back in 2 weeks. Dr. Velna Ochs stop the doxycycline and ciprofloxacin and I do not believe he follows up with them at this point. Culture I did last week showed both doxycycline resistant MRSA and Pseudomonas not sensitive to ciprofloxacin although only in rare titers 1/19; the patient's wound on the left anterior lower leg is just about healed. We have continued healing of the area that was medially on the left leg. Left first plantar metatarsal head continues to get smaller. The major problem here is his 2 drain sites 1 on the left upper calf and lateral thigh. There is purulent drainage still from the left lateral thigh. I gave him antibiotics last week but we still have recultured. He has the drain in the area I think this is eventually going to have to come out. I suspect there will be a connecting wound to heal here perhaps with improved VAc 1/26; the patient had his drain removed by vein and vascular on 1/25/. This was a large pocket of fluid in his left thigh that seem to tunnel into his left upper calf. 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 His mention his Penrose drain was removed today. He now has a tunneling wound on his left calf and left thigh. Both of these probe widely towards each other although I cannot really prove that they connect. Both wounds on his lower leg anteriorly are closed and his area over the first metatarsal head on his right foot continues to improve. We are using Hydrofera Blue  here. He also saw infectious disease culture of the abscess they noted was polymicrobial with MRSA, Morganella and Klebsiella he was treated with doxycycline and ciprofloxacin for 4 weeks ending on 07/03/2021. They did not recommend any further antibiotics. Notable that while he still had the Penrose drain in place last week he had purulent drainage coming out of the inferior IandD site this grew [facility], MRSA and Pseudomonas but there does not appear to be any active infection in this area today with the drain out and he is not systemically unwell 2/2; with regards to the drain sites the superior one  on the thigh actually is closed down the one on the upper left lateral calf measures about 8 and half centimeters which is an improvement seems to be less prominent although still with a lot of drainage. The only remaining wound is over the first metatarsal head on the left foot and this looks to be continuing to improve with Hydrofera Blue. 2/9; the area on his plantar left foot continues to contract. Callus around the wound edge. The drain sites specifically have not come down in depth. We put the wound VAC on Monday he changed the canister late last night our intake nurse reported a pocket of fluid perhaps caused by our compression wraps 2/16; continued improvement in left foot plantar wound. drainage site in the calf is not improved in terms of depth (wound vac) 2/23; continued improvement in the left foot wound over the first metatarsal head. With regards to the drain sites the area on his thigh laterally is healed however the open area on his calf is small in terms of circumference by still probes in by about 15 cm. Within using the wound VAC. Hydrofera Blue on his foot 08/24/2021: The left first metatarsal head wound continues to improve. The wound bed is healthy with just some surrounding callus. Unfortunately the open drain site on his calf remains open and tunnels at least 15 cm (the extent  of a Q-tip). This is despite several weeks of wound VAC treatment. Based on reading back through the notes, there has been really no significant change in the depth of the wound, although the orifice is smaller and the more cranial wound on his thigh has closed. I suspect the tunnel tracks nearly all the way to this location. 08/31/2021: Continued improvement in the left first metatarsal head wound. There has been absolutely no improvement to the long tunnel from his open drain site on his calf. We have tried to get him into see vascular surgery sooner to consider the possibility of simply filleting the tract open and allowing it to heal from the bottom up, likely with a wound VAC. They have not yet scheduled a sooner appointment than his current mid April 09/14/2021: He was seen by vascular surgery and they took him to the operating room last week. They opened a portion of the tunnel, but did not extend the entire length of the known open subcutaneous tract. I read Dr. Claretha Cooper operative note and it is not clear from that documentation why only a portion of the tract was opened. The heaped up granulation tissue was curetted and removed from at least some portion of the tract. They did place a wound VAC and applied an Unna boot to the leg. The ulcer on his left first metatarsal head is smaller today. The bed looks good and there is just a small amount of surrounding callus. 09/21/2021: The ulcer on his left first metatarsal head looks to be stalled. There is some callus surrounding the wound but the wound bed itself does not appear particularly dynamic. The tunnel tract on his lateral left leg seems to be roughly the same length or perhaps slightly smaller but the wound bed appears healthy with good granulation tissue. He opened up a new wound on his medial thigh and the site of a prior surgical incision. He says that he did this unconsciously in his sleep by scratching. 09/28/2021: Unfortunately, the ulcer on  his left first metatarsal head has extended underneath the callus toward the dorsum of his foot. The medial thigh  wounds are roughly the same. The tunnel on his lateral left leg continues to be problematic; it is longer than we are able to actually probe with a Q-tip. I am still not certain as to why Dr. Donzetta Matters did not open this up entirely when he took the patient to the operating room. We will likely be back in the same situation with just a small superficial opening in a long unhealed tract, as the open portion is granulating in nicely. 10/02/2021: The patient was initially scheduled for a nurse visit, but we are also applying a total contact cast today. The plantar foot wound looks clean without significant accumulated callus. We have been applying Prisma silver collagen to the site. 10/05/2021: The patient is here for his first total contact cast change. We have tried using gauze packing strips in the tunnel on his lateral leg wound, but this does not seem to be working any better than the white VAC foam. The foot ulcer looks about the same with minimal periwound callus. Medial thigh wound is clean with just some overlying eschar. 10/12/2021: The plantar foot wound is stable without any significant accumulation of periwound callus. The surface is viable with good granulation tissue. The medial thigh wounds are much smaller and are epithelializing. On the other hand, he had purulent drainage coming from the tunnel on his lateral leg. He does go back to see Dr. Donzetta Matters next week and is planning to ask him why the wound tunnel was not completely opened at the time of his most recent operation. 10/19/2021: The plantar foot wound is markedly improved and has epithelial tissue coming through the surface. The medial thigh wounds are nearly closed with just a tiny open area. He did see Dr. Donzetta Matters earlier this week and apparently they did discuss the possibility of opening the sinus tract further and enabling a wound  VAC application. Apparently there are some limits as to what Dr. 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 I think if we could get the tract open to the level of the popliteal fossa, this would greatly aid in her ability to get this chart closed. That being said, however, today when I probed the tract with a Q-tip, I was not able to insert the entirety of the Q-tip as I have on previous occasions. The tunnel is shorter by about 4 cm. The surface is clean with good granulation tissue and no further episodes of purulent drainage. 10/30/2021: Last week, the patient underwent surgery and had the long tract in his leg opened. There was a rind that was debrided, according to the operative report. His medial thigh ulcers are closed. The plantar foot wound is clean with a good surface and some built up surrounding callus. 11/06/2021: The overall dimensions of the large wound on his lateral leg remain about the same, but there is good granulation tissue present and the tunneling is a little bit shorter. He has a new wound on his anterior tibial surface, in the same location where he had a similar lesion in the past. The plantar foot wound is clean with some buildup surrounding callus. Just toward the medial aspect of his foot, however, there is an area of darkening that once debrided, revealed another opening in the skin surface. 11/13/2021: The anterior tibial surface wound is closed. The plantar foot wound has some surrounding callus buildup. The area of darkening that I debrided last week and revealed an opening in the skin surface has closed again.  The tunnel in the large wound on his lateral leg has come in by about 3 cm. There is healthy granulation tissue on the entire wound surface. 11/23/2021: The patient was out of town last week and did wet-to-dry dressings on his large wound. He says that he rented an Forensic psychologist and was [name], [name] ([id number])  [id number] Page 11 of 14 able to avoid walking for much of his vacation. Unfortunately, he picked open the wound on his left medial thigh. He says that it was itching and he just could not stop scratching it until it was open again. The wound on his plantar foot is smaller and has not accumulated a tremendous amount of callus. The lateral leg wound is shallower and the tunnel has also decreased in depth. There is just a little bit of slough accumulation on the surface. 11/30/2021: Another portion of his left medial thigh has been opened up. All of these wounds are fairly superficial with just a little bit of slough and eschar accumulation. The wound on his plantar foot is almost closed with just a bit of eschar and periwound callus accumulation. The lateral leg wound is nearly flush with the surrounding skin and the tunnel is markedly shallower. 12/07/2021: There is just 1 open area on his left medial thigh. It is clean with just a little bit of perimeter eschar. The wound on his plantar foot continues to contract and just has some eschar and periwound callus accumulation. The lateral leg wound is closing at the more distal aspect and the tunnel is smaller. The surface is nearly flush with the surrounding skin and it has a good bed of granulation tissue. 12/14/2021: The thigh and foot wounds are closed. The lateral leg wound has closed over approximately half of its length. The tunnel continues to contract and the surface is now flush with the surrounding skin. The wound bed has robust granulation tissue. 12/22/2021: The thigh and foot wounds have reopened. The foot wound has a lot of callus accumulation around and over it. The thigh wound is tiny with just a little bit of slough in the wound bed. The lateral leg wound continues to contract. His vascular surgeon took the wound VAC off earlier in the week and the patient has been doing wet-to-dry dressings. There is a little  slough accumulation on the surface. The tunnel is about 3 cm in depth at this point. 12/28/2021: The thigh wound is closed again. The foot wound has some callus that subsequently has peeled back exposing just a small slit of a wound. The lateral leg wound Is down to about half the size that it originally was and the tunnel is down to about half a centimeter in depth. 01/04/2022: The thigh wound remains closed. The foot wound has heavy callus overlying the wound site. Once this was debrided, the wound was found to be closed. The lateral leg wound is smaller again this week and very superficial. No tunnel could be identified. 01/12/2022: The thigh and foot wounds both remain closed. The lateral leg wound is now nearly flush with the skin surface. There is good granulation tissue present with a light layer of slough. 01/19/2022: Due to the way his wrap was placed, the patient did not change the dressing on his thigh at all and so the foam was saturated and his skin is macerated. There is a light layer of slough on the wound surface. The underlying granulation tissue is robust and healthy-appearing. He has heavy callus buildup at  the site of his first metatarsal head wound which is still healed. 02/01/2022: He has been in silver alginate. When he removed the dressing from his thigh wound, however, some leg, superficially reopening a portion of the wound that had healed. In addition, underneath the callus at his left first metatarsal head, there appears to be a blister and the wound appears to be open again. 02/08/2022: The lateral leg wound has contracted substantially. There is eschar and a light layer of slough present. He says that it is starting to pull and is uncomfortable. On inspection, there is some puckering of the scar and the eschar is quite dry; this may account for his symptoms. On his first metatarsal head, the wound is much smaller with just some eschar on the surface. The callus has not  reaccumulated. He reports that he had a blister come up on his medial thigh wound at the distal aspect. It popped and there is now an opening in his skin again. Looking back through his G[place] of wound photos, there is what looks like a permanent suture just deep to this location and it may be trying to erode through. We have been using silver alginate on his wounds. 02/15/2022: The lateral leg wound is about half the size it was last week. It is clean with just a little perimeter eschar and light slough. The wound on his first metatarsal head is about the same with heavy callus overlying it. The medial thigh wound is closed again. He does have some skin changes on the top of his foot that looks potentially yeast related. 02/22/2022: The skin on the top of his foot improved with the use of a topical antifungal. The lateral leg wound continues to contract and is again smaller this week. There is a little bit of slough and eschar on the surface. The first metatarsal head wound is a little bit smaller but has reaccumulated a thick callus over the top. He decided to try to trim his toenail and ultimately took the entire nail off of his left great toe. 03/02/2022: His lateral leg wound continues to improve, as does the wound on his left great toe. Unfortunately, it appears that somehow his foot got wet and moisture seeped in through the opening causing his skin to lift. There is a large wound now overlying his first metatarsal on both the plantar, medial, and dorsal portion of his foot. There is necrotic tissue and slough present underneath the shaggy macerated skin. 03/08/2022: The lateral leg wound is smaller again today. There is just a light layer of slough and eschar on the surface. The great toe wound is smaller again today. The first metatarsal wound is a little bit smaller today and does not look nearly as necrotic and macerated. There is still slough and nonviable tissue present. 03/15/2022: The  lateral leg wound is narrower and just has a little bit of light slough buildup. The first metatarsal wound still has a fair amount of moisture affecting the periwound skin. The great toe wound is healed. 03/22/2022: The lateral leg wound is now isolated to just at the level of his knee. There is some eschar and slough accumulation. The first metatarsal head wound has epithelialized tremendously and is about half the size that it was last week. He still has some maceration on the top of his foot and a fungal odor is present. 03/29/2022: T oday the patient's foot was macerated, suggesting that the cast got wet. The patient has also been picking  at his dry skin and has enlarged the wound on his left lateral leg. In the time between having his cast removed and my evaluation, he had picked more dry skin and opened up additional wounds on his Achilles area and dorsal foot. The plantar first metatarsal head wound, however, is smaller and clean with just macerated callus around the perimeter and light slough on the surface. The lateral leg wound measured a little bit larger but is also fairly clean with eschar and minimal slough. 04/02/2022: The patient had vascular studies done last Friday and so his cast was not applied. He is here today to have that done. 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 04/05/2022: Both wounds are smaller and quite clean. There is just a little biofilm on the lateral leg wound. 10/20; the patient has a wound on the left lateral surgical incision at the level of his lateral knee this looks clean and improved. He is using silver alginate. He also has an area on his left medial foot for which she is using Hydrofera Blue under a total contact cast both wounds are measuring smaller Objective Constitutional Patient is hypertensive.. Pulse regular and within target range for patient.Marland Kitchen Respirations regular, non-labored and within target range.. Temperature is normal  and within the target range for the patient.Marland Kitchen Appears in no distress. [name], [name] ([id number]) [id number] Page 12 of 14 Vitals Time Taken: 8:10 AM, Height: 74 in, Weight: 238 lbs, BMI: 30.6, Temperature: 98.7 F, Pulse: 72 bpm, Respiratory Rate: 20 breaths/min, Blood Pressure: 152/83 mmHg, Capillary Blood Glucose: 119 mg/dl. General Notes: glucose per pt report this am General Notes: Wound exam; his wounds actually look quite good both 1 both of them. We will continue silver alginate to the wound on the left medial upper leg and continue with Hydrofera Blue on the foot wound. T contact cast applied in the standard fashion. otal Integumentary (Hair, Skin) Wound #22 status is Open. Original cause of wound was Bump. The date acquired was: 06/03/2021. The wound has been in treatment 44 weeks. The wound is located on the Left,Lateral Lower Leg. The wound measures 5cm length x 1.5cm width x 0.1cm depth; 5.89cm^2 area and 0.589cm^3 volume. There is Fat Layer (Subcutaneous Tissue) exposed. There is no tunneling or undermining noted. There is a medium amount of serosanguineous drainage noted. The wound margin is fibrotic, thickened scar. There is large (67-100%) red, pale granulation within the wound bed. There is no necrotic tissue within the wound bed. The periwound skin appearance had no abnormalities noted for color. The periwound skin appearance exhibited: Scarring, Dry/Scaly. Periwound temperature was noted as No Abnormality. The periwound has tenderness on palpation. Wound #27 status is Open. Original cause of wound was Blister. The date acquired was: 02/01/2022. The wound has been in treatment 10 weeks. The wound is located on the Left,Medial Foot. The wound measures 0.8cm length x 1.4cm width x 0.1cm depth; 0.88cm^2 area and 0.088cm^3 volume. There is Fat Layer (Subcutaneous Tissue) exposed. There is no tunneling or undermining noted. There is a medium amount of  serosanguineous drainage noted. The wound margin is distinct with the outline attached to the wound base. There is large (67-100%) red granulation within the wound bed. There is no necrotic tissue within the wound bed. The periwound skin appearance had no abnormalities noted for color. The periwound skin appearance exhibited: Callus, Dry/Scaly. The periwound skin appearance did not exhibit: Maceration. Periwound temperature was noted as No Abnormality. 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 There was a T Contact Cast otal Procedure by Ricard Dillon., MD. Post procedure Diagnosis Wound #27: Same as Pre-Procedure Plan Follow-up Appointments: Return Appointment in 1 week. - Dr Celine Ahr - Room 1 TCC Anesthetic: Wound #22 Left,Lateral Lower Leg: (In clinic) Topical Lidocaine 4% applied to wound bed Wound #27 Left,Medial Foot: (In clinic) Topical Lidocaine 4% applied to wound bed Bathing/ Shower/ Hygiene: May shower with protection but do not get wound dressing(s) wet. - Use a cast protector so you can shower without getting your cast wet Edema Control - Lymphedema / SCD / Other: Elevate legs to the level of the heart or above for 30 minutes daily and/or when sitting, a frequency of: - throughout the day Avoid standing for long periods of time. Patient to wear own compression stockings every day. - on right leg; Moisturize legs daily. - Ammonium lactate to right leg daily Compression  stocking or Garment 20-30 mm/Hg pressure to: - left leg daily Off-Loading: T Contact Cast to Left Lower Extremity - TCC left leg otal Other: - minimal weight bearing left foot WOUND #22: - Lower Leg Wound Laterality: Left, Lateral Cleanser: Soap and Water 3 x Per Week/30 Days Discharge Instructions: May shower and wash wound with dial antibacterial soap and water prior to dressing change. Cleanser: Wound Cleanser 3 x Per Week/30 Days Discharge Instructions: Cleanse the wound with wound cleanser prior to applying a clean dressing using gauze sponges, not tissue or cotton balls. Peri-Wound Care: Sween Lotion (Moisturizing lotion) 3 x Per Week/30 Days Discharge Instructions: Apply moisturizing lotion to the leg Prim Dressing: KerraCel Ag Gelling Fiber Dressing, 4x5 in (silver alginate) 3 x Per Week/30 Days ary Discharge Instructions: Apply silver alginate to wound bed as instructed Secondary Dressing: ABD Pad, 8x10 (Generic) 3 x Per Week/30 Days Discharge Instructions: Apply over primary dressing as directed. Secured With: Elastic Bandage 4 inch (ACE bandage) 3 x Per Week/30 Days Discharge Instructions: Secure with ACE bandage as directed. [name], [name] ([id number]) [id number] Page 13 of 14 WOUND #27: - Foot Wound Laterality: Left, Medial Peri-Wound Care: Sween Lotion (Moisturizing lotion) 1 x Per Week/ Discharge Instructions: Apply moisturizing lotion as directed Prim Dressing: Hydrofera Blue Classic Foam, 4x4 in 1 x Per Week/ ary Discharge Instructions: Moisten with saline prior to applying to wound bed Secondary Dressing: Optifoam Non-Adhesive Dressing, 4x4 in 1 x Per Week/ Discharge Instructions: Apply over primary dressing cut to make foam donut Secondary Dressing: Woven Gauze Sponges 2x2 in 1 x Per Week/ Discharge Instructions: Apply over primary dressing as directed. Secured With: 51M Medipore H Soft Cloth Surgical T ape, 4 x 10 (in/yd) 1 x Per  Week/ Discharge Instructions: Secure with tape as directed. Add-Ons: TCC-EZ Cast Boot, Charcot,Large 1 x Per Week/ Discharge Instructions: SIZE 3 TCC No change in the primary dressings total contact cast reapplied in the standard fashion Electronic Signature(s) Signed: 04/13/2022 4:47:23 PM By: Linton Ham MD Entered By: Linton Ham on 04/13/2022 10:00:37 -------------------------------------------------------------------------------- Total Contact Cast Details Patient Name: Date of Service: [name]. 04/13/2022 8:15 A M Medical Record Number: [id number]  Patient Account Number: [id number] Date of Birth/Sex: Treating RN: 07-27-1950 (71 y.o. M) Primary Care Provider: Jilda [name] Other Clinician: Referring Provider: Treating Provider/Extender: [name] in Treatment: 52 T Contact Cast Applied for Wound Assessment: otal Wound #27 Left,Medial Foot Performed By: Physician Ricard Dillon., MD Post Procedure Diagnosis Same as Pre-procedure Electronic Signature(s) Signed: 04/13/2022 4:47:23 PM By: Linton Ham MD Entered By: Linton Ham on 04/13/2022 09:57:33 -------------------------------------------------------------------------------- SuperBill Details Patient Name: Date of Service: [name] 04/13/2022 Medical Record Number: [id number] Patient Account Number: [id number] Date of Birth/Sex: Treating RN: 02/04/1951 (71 y.o. [name] Primary Care Provider: Jilda [name] Other Clinician: Referring Provider: Treating Provider/Extender: [name] in Treatment: 52 Diagnosis Coding ICD-10 Codes Code Description 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 I89.0 Lymphedema, not elsewhere classified I87.322 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 L97.828 Non-pressure chronic ulcer of other part of left lower leg with  other specified severity KISHAWN, [name] ([id number]) [id number] Page 14 of 14 L97.528 Non-pressure chronic ulcer of other part of left foot with other specified severity Facility Procedures : CPT4 Code: 11572620 Description: [phone] - APPLY TOTAL CONTACT LEG CAST ICD-10 Diagnosis Description L97.528 Non-pressure chronic ulcer of other part of left foot with other specified sever Modifier: ity Quantity: 1 Physician Procedures : CPT4 Code Description Modifier [id number] - WC PHYS APPLY TOTAL CONTACT CAST ICD-10 Diagnosis Description L97.528 Non-pressure chronic ulcer of other part of left foot with other specified severity Quantity: 1 Electronic Signature(s) Signed: 04/13/2022 4:47:23 PM By: Linton Ham MD Entered By: Linton Ham on 04/13/2022 10:00:45

## 2022-04-13 NOTE — Progress Notes (Signed)
[name], [name] ([id number]) [id number] Page 1 of 10 Visit Report for 04/13/2022 Arrival Information Details Patient Name: Date of Service: [name], [name] 04/13/2022 8:15 A M Medical Record Number: [id number] Patient Account Number: [id number] Date of Birth/Sex: Treating RN: [birth year] (71 y.o. M) Primary Care Janaye Corp: [name] Other Clinician: Referring Javon Hupfer: Treating Licet Dunphy/Extender: Stormy Card in Treatment: 52 Visit Information History Since Last Visit All ordered tests and consults were completed: No Patient Arrived: Ambulatory Added or deleted any medications: No Arrival Time: 08:16 Any new allergies or adverse reactions: No Transfer Assistance: None Had a fall or experienced change in No Patient Identification Verified: Yes activities of daily living that may affect Secondary Verification Process Completed: Yes risk of falls: Patient Requires Transmission-Based Precautions: No Signs or symptoms of abuse/neglect since last visito No Patient Has Alerts: Yes Hospitalized since last visit: No Implantable device outside of the clinic excluding No cellular tissue based products placed in the center since last visit: Pain Present Now: No Electronic Signature(s) Signed: 04/13/2022 11:34:44 AM By: Worthy Rancher Entered By: Worthy Rancher on 04/13/2022 08:16:34 -------------------------------------------------------------------------------- Encounter Discharge Information Details Patient Name: Date of Service: [name]. 04/13/2022 8:15 A M Medical Record Number: [id number] Patient Account Number: [id number] Date of Birth/Sex: Treating RN: 12-12-1950 (71 y.o. [name] Primary Care [place] Lapaglia: [name] Other Clinician: Referring Londynn Sonoda: Treating Kass Herberger/Extender: Stormy Card in Treatment: 4 Encounter Discharge Information Items Discharge Condition: Stable Ambulatory Status:  Ambulatory Discharge Destination: Home Transportation: Private Auto Accompanied By: self Schedule Follow-up Appointment: Yes Clinical Summary of Care: Patient Declined Electronic Signature(s) Signed: 04/13/2022 11:14:03 AM By: Baruch Gouty RN, BSN Entered By: Baruch Gouty on 04/13/2022 09:27:55 [name] ([id number]) [id number] Page 2 of 10 -------------------------------------------------------------------------------- Lower Extremity Assessment Details Patient Name: Date of Service: [name], [name] 04/13/2022 8:15 A M Medical Record Number: [id number] Patient Account Number: [id number] Date of Birth/Sex: Treating RN: [birth year] (71 y.o. [name] Primary Care Phi Avans: [name] Other Clinician: Referring Meshell Abdulaziz: Treating Ikesha Siller/Extender: Stormy Card in Treatment: 52 Edema Assessment Assessed: [name]: No] [name]: No] Edema: [Left: Ye] [Right: s] Calf Left: Right: Point of Measurement: 41 cm From Medial Instep 46 cm Ankle Left: Right: Point of Measurement: 10 cm From Medial Instep 28.5 cm Vascular Assessment Pulses: Dorsalis Pedis Palpable: [Left:No] Electronic Signature(s) Signed: 04/13/2022 11:14:03 AM By: Baruch Gouty RN, BSN Entered By: Baruch Gouty on 04/13/2022 08:33:31 -------------------------------------------------------------------------------- Multi Wound Chart Details Patient Name: Date of Service: [name]. 04/13/2022 8:15 A M Medical Record Number: [id number] Patient Account Number: [id number] Date of Birth/Sex: Treating RN: September 12, 1950 (71 y.o. M) Primary Care Tenessa Marsee: [name] Other Clinician: Referring Jahmir Salo: Treating Laramie Meissner/Extender: Stormy Card in Treatment: 52 Vital Signs Height(in): 74 Capillary Blood Glucose(mg/dl): 119 Weight(lbs): 238 Pulse(bpm): 72 Body Mass Index(BMI): 30.6 Blood Pressure(mmHg):  152/83 Temperature(F): 98.7 Respiratory Rate(breaths/min): 20 [22:Photos:] [N/A:N/A [id number] Page 3 of 10] Left, Lateral Lower Leg Left, Medial Foot N/A Wound Location: Bump Blister N/A Wounding Event: Cyst Diabetic Wound/Ulcer of the Lower N/A Primary Etiology: Extremity Glaucoma, Sleep Apnea, Glaucoma, Sleep Apnea, N/A Comorbid History: Hypertension, Peripheral Arterial Hypertension, Peripheral Arterial Disease, Peripheral Venous Disease, Disease, Peripheral Venous Disease, Type II Diabetes, Gout, Osteoarthritis, Type II Diabetes, Gout, Osteoarthritis, Neuropathy Neuropathy 06/03/2021 02/01/2022 N/A Date Acquired: 50 10 N/A Weeks of Treatment: Open Open N/A Wound Status: No No N/A Wound Recurrence: Yes No N/A Clustered Wound: 3 N/A N/A Clustered Quantity: 5x1.5x0.1  0.8x1.4x0.1 N/A Measurements L x W x D (cm) 5.89 0.88 N/A A (cm) : rea 0.589 0.088 N/A Volume (cm) : -257.20% 13.80% N/A % Reduction in Area: 55.30% 13.70% N/A % Reduction in Volume: Full Thickness With Exposed Support Grade 1 N/A Classification: Structures Medium Medium N/A Exudate Amount: Serosanguineous Serosanguineous N/A Exudate Type: red, brown red, brown N/A Exudate Color: Fibrotic scar, thickened scar Distinct, outline attached N/A Wound Margin: Large (67-100%) Large (67-100%) N/A Granulation Amount: Red, Pale Red N/A Granulation Quality: None Present (0%) None Present (0%) N/A Necrotic Amount: Fat Layer (Subcutaneous Tissue): Yes Fat Layer (Subcutaneous Tissue): Yes N/A Exposed Structures: Fascia: No Fascia: No Tendon: No Tendon: No Muscle: No Muscle: No Joint: No Joint: No Bone: No Bone: No Small (1-33%) Small (1-33%) N/A Epithelialization: Scarring: Yes Callus: Yes N/A Periwound Skin Texture: Dry/Scaly: Yes Dry/Scaly: Yes N/A Periwound Skin Moisture: Maceration: No No Abnormalities Noted No Abnormalities Noted N/A Periwound Skin  Color: No Abnormality No Abnormality N/A Temperature: Yes N/A N/A Tenderness on Palpation: N/A T Contact Cast otal N/A Procedures Performed: Treatment Notes Wound #22 (Lower Leg) Wound Laterality: Left, Lateral Cleanser Soap and Water Discharge Instruction: May shower and wash wound with dial antibacterial soap and water prior to dressing change. Wound Cleanser Discharge Instruction: Cleanse the wound with wound cleanser prior to applying a clean dressing using gauze sponges, not tissue or cotton balls. Peri-Wound Care Sween Lotion (Moisturizing lotion) Discharge Instruction: Apply moisturizing lotion to the leg Topical Primary Dressing KerraCel Ag Gelling Fiber Dressing, 4x5 in (silver alginate) Discharge Instruction: Apply silver alginate to wound bed as instructed Secondary Dressing ABD Pad, 8x10 Discharge Instruction: Apply over primary dressing as directed. Secured With Elastic Bandage 4 inch (ACE bandage) Discharge Instruction: Secure with ACE bandage as directed. Compression Wrap Compression Stockings Add-Ons Wound #27 (Foot) Wound Laterality: Left, Medial BROK, STOCKING ([id number]) [id number] Page 4 of 10 Cleanser Peri-Wound Care Sween Lotion (Moisturizing lotion) Discharge Instruction: Apply moisturizing lotion as directed Topical Primary Dressing Hydrofera Blue Classic Foam, 4x4 in Discharge Instruction: Moisten with saline prior to applying to wound bed Secondary Dressing Optifoam Non-Adhesive Dressing, 4x4 in Discharge Instruction: Apply over primary dressing cut to make foam donut Woven Gauze Sponges 2x2 in Discharge Instruction: Apply over primary dressing as directed. Secured With 41M Medipore H Soft Cloth Surgical T ape, 4 x 10 (in/yd) Discharge Instruction: Secure with tape as directed. Compression Wrap Compression Stockings Add-Ons TCC-EZ Cast Boot, [place] Discharge Instruction: SIZE 3 TCC Electronic  Signature(s) Signed: 04/13/2022 4:47:23 PM By: Linton Ham MD Entered By: Linton Ham on 04/13/2022 09:57:24 -------------------------------------------------------------------------------- Multi-Disciplinary Care Plan Details Patient Name: Date of Service: [name]. 04/13/2022 8:15 A M Medical Record Number: [id number] Patient Account Number: [id number] Date of Birth/Sex: Treating RN: [birth year] (71 y.o. [name] Primary Care Gizelle Whetsel: [name] Other Clinician: Referring Journie Howson: Treating Landon Bassford/Extender: Stormy Card in Treatment: 52 Multidisciplinary Care Plan reviewed with physician Active Inactive Venous Leg Ulcer Nursing Diagnoses: Knowledge deficit related to disease process and management Potential for venous Insuffiency (use before diagnosis confirmed) Goals: Patient will maintain optimal edema control Date Initiated: 07/27/2021 Target Resolution Date: 05/10/2022 Goal Status: Active Interventions: Assess peripheral edema status every visit. Treatment Activities: Therapeutic compression applied : 07/27/2021 Notes: JOSEJULIAN, TARANGO ([id number]) [phone].pdf Page 5 of 10 Wound/Skin Impairment Nursing Diagnoses: Impaired tissue integrity Knowledge deficit related to ulceration/compromised skin integrity Goals: Patient will have a decrease in wound volume by X% from date: (specify in notes) Date Initiated:  04/12/2021 Date Inactivated: 01/04/2022 Target Resolution Date: 04/23/2021 Goal Status: Met Patient/caregiver will verbalize understanding of skin care regimen Date Initiated: 01/04/2022 Target Resolution Date: 04/27/2022 Goal Status: Active Ulcer/skin breakdown will have a volume reduction of 30% by week 4 Date Initiated: 04/12/2021 Date Inactivated: 04/27/2021 Target Resolution Date: 04/27/2021 Goal Status: Unmet Unmet Reason: infection Ulcer/skin breakdown will have a volume reduction of 50%  by week 8 Date Initiated: 04/27/2021 Date Inactivated: 06/29/2021 Target Resolution Date: 06/24/2021 Goal Status: Met Interventions: Assess patient/caregiver ability to obtain necessary supplies Assess patient/caregiver ability to perform ulcer/skin care regimen upon admission and as needed Assess ulceration(s) every visit Notes: Electronic Signature(s) Signed: 04/13/2022 11:14:03 AM By: Baruch Gouty RN, BSN Entered By: Baruch Gouty on 04/13/2022 08:37:54 -------------------------------------------------------------------------------- Pain Assessment Details Patient Name: Date of Service: [name]. 04/13/2022 8:15 A M Medical Record Number: [id number] Patient Account Number: [id number] Date of Birth/Sex: Treating RN: 1951/06/24 (71 y.o. M) Primary Care Sanari Offner: [name] Other Clinician: Referring Tatiyanna Lashley: Treating Ilyanna Baillargeon/Extender: Stormy Card in Treatment: 52 Active Problems Location of Pain Severity and Description of Pain Patient Has Paino No Site Locations Rate the pain. Current Pain Level: 0 Pain Management and Medication Current Pain Management: RASHAN, [place] ([id number]) [phone].pdf Page 6 of 10 Electronic Signature(s) Signed: 04/13/2022 11:14:03 AM By: Baruch Gouty RN, BSN Entered By: Baruch Gouty on 04/13/2022 08:21:35 -------------------------------------------------------------------------------- Patient/Caregiver Education Details Patient Name: Date of Service: [name] 10/20/2023andnbsp8:15 A M Medical Record Number: [id number] Patient Account Number: [id number] Date of Birth/Gender: Treating RN: [birth year] (71 y.o. [name] Primary Care Physician: [name] Other Clinician: Referring Physician: Treating Physician/Extender: Stormy Card in Treatment: 62 Education Assessment Education Provided To: Patient Education Topics  Provided Offloading: Methods: Explain/Verbal Responses: Reinforcements needed, State content correctly Wound/Skin Impairment: Methods: Explain/Verbal Responses: Reinforcements needed, State content correctly Electronic Signature(s) Signed: 04/13/2022 11:14:03 AM By: Baruch Gouty RN, BSN Entered By: Baruch Gouty on 04/13/2022 08:38:20 -------------------------------------------------------------------------------- Wound Assessment Details Patient Name: Date of Service: [name]. 04/13/2022 8:15 A M Medical Record Number: [id number] Patient Account Number: [id number] Date of Birth/Sex: Treating RN: 05/25/1951 (71 y.o. [name] Primary Care Kahliya Fraleigh: [name] Other Clinician: Referring Morna Flud: Treating Milas Schappell/Extender: Stormy Card in Treatment: 52 Wound Status Wound Number: 66 Primary Cyst Etiology: Wound Location: Left, Lateral Lower Leg Wound Open Wounding Event: Bump Status: Date Acquired: 06/03/2021 Comorbid Glaucoma, Sleep Apnea, Hypertension, Peripheral Arterial Disease, Weeks Of Treatment: 44 History: Peripheral Venous Disease, Type II Diabetes, Gout, Osteoarthritis, Clustered Wound: Yes Neuropathy Photos DEWAN, EMOND ([id number]) [id number] Page 7 of 10 Wound Measurements Length: (cm) Width: (cm) Depth: (cm) Clustered Quantity: Area: (cm) Volume: (cm) 5 % Reduction in Area: -257.2% 1.5 % Reduction in Volume: 55.3% 0.1 Epithelialization: Small (1-33%) 3 Tunneling: No 5.89 Undermining: No 0.589 Wound Description Classification: Full Thickness With Exposed Support Structures Wound Margin: Fibrotic scar, thickened scar Exudate Amount: Medium Exudate Type: Serosanguineous Exudate Color: red, brown Foul Odor After Cleansing: No Slough/Fibrino No Wound Bed Granulation Amount: Large (67-100%) Exposed Structure Granulation Quality: Red, Pale Fascia Exposed: No Necrotic Amount:  None Present (0%) Fat Layer (Subcutaneous Tissue) Exposed: Yes Tendon Exposed: No Muscle Exposed: No Joint Exposed: No Bone Exposed: No Periwound Skin Texture Texture Color No Abnormalities Noted: No No Abnormalities Noted: Yes Scarring: Yes Temperature / Pain Temperature: No Abnormality Moisture No Abnormalities Noted: No Tenderness on Palpation: Yes Dry / Scaly: Yes Treatment Notes Wound #22 (Lower Leg) Wound  Laterality: Left, Lateral Cleanser Soap and Water Discharge Instruction: May shower and wash wound with dial antibacterial soap and water prior to dressing change. Wound Cleanser Discharge Instruction: Cleanse the wound with wound cleanser prior to applying a clean dressing using gauze sponges, not tissue or cotton balls. Peri-Wound Care Sween Lotion (Moisturizing lotion) Discharge Instruction: Apply moisturizing lotion to the leg Topical Primary Dressing KerraCel Ag Gelling Fiber Dressing, 4x5 in (silver alginate) Discharge Instruction: Apply silver alginate to wound bed as instructed Secondary Dressing ABD Pad, 8x10 Discharge Instruction: Apply over primary dressing as directed. Secured With Elastic Bandage 4 inch (ACE bandage) Discharge Instruction: Secure with ACE bandage as directed. JUNIE, ENGRAM ([id number]) [id number] Page 8 of 10 Compression Wrap Compression Stockings Add-Ons Electronic Signature(s) Signed: 04/13/2022 11:14:03 AM By: Baruch Gouty RN, BSN Signed: 04/13/2022 11:34:44 AM By: Worthy Rancher Entered By: Worthy Rancher on 04/13/2022 08:38:36 -------------------------------------------------------------------------------- Wound Assessment Details Patient Name: Date of Service: [name]. 04/13/2022 8:15 A M Medical Record Number: [id number] Patient Account Number: [id number] Date of Birth/Sex: Treating RN: 12/05/1950 (71 y.o. [name] Primary Care Avacyn Kloosterman: [name] Other Clinician: Referring  Jahmya Onofrio: Treating Keyonni Percival/Extender: Stormy Card in Treatment: 52 Wound Status Wound Number: 27 Primary Diabetic Wound/Ulcer of the Lower Extremity Etiology: Wound Location: Left, Medial Foot Wound Open Wounding Event: Blister Status: Date Acquired: 02/01/2022 Comorbid Glaucoma, Sleep Apnea, Hypertension, Peripheral Arterial Disease, Weeks Of Treatment: 10 History: Peripheral Venous Disease, Type II Diabetes, Gout, Osteoarthritis, Clustered Wound: No Neuropathy Photos Wound Measurements Length: (cm) 0.8 Width: (cm) 1.4 Depth: (cm) 0.1 Area: (cm) 0.88 Volume: (cm) 0.088 % Reduction in Area: 13.8% % Reduction in Volume: 13.7% Epithelialization: Small (1-33%) Tunneling: No Undermining: No Wound Description Classification: Grade 1 Wound Margin: Distinct, outline attached Exudate Amount: Medium Exudate Type: Serosanguineous Exudate Color: red, brown Foul Odor After Cleansing: No Slough/Fibrino No Wound Bed Granulation Amount: Large (67-100%) Exposed Structure Granulation Quality: Red Fascia Exposed: No Necrotic Amount: None Present (0%) Fat Layer (Subcutaneous Tissue) Exposed: Yes Tendon Exposed: No Muscle Exposed: No Joint Exposed: No Bone Exposed: No [address] CHAISE, MAHABIR ([id number]) [id number] Page 9 of 10 Texture Color No Abnormalities Noted: No No Abnormalities Noted: Yes Callus: Yes Temperature / Pain Temperature: No Abnormality Moisture No Abnormalities Noted: No Dry / Scaly: Yes Maceration: No Treatment Notes Wound #27 (Foot) Wound Laterality: Left, Medial Cleanser Peri-Wound Care Sween Lotion (Moisturizing lotion) Discharge Instruction: Apply moisturizing lotion as directed Topical Primary Dressing Hydrofera Blue Classic Foam, 4x4 in Discharge Instruction: Moisten with saline prior to applying to wound bed Secondary Dressing Optifoam Non-Adhesive Dressing, 4x4 in Discharge  Instruction: Apply over primary dressing cut to make foam donut Woven Gauze Sponges 2x2 in Discharge Instruction: Apply over primary dressing as directed. Secured With 88M Medipore H Soft Cloth Surgical T ape, 4 x 10 (in/yd) Discharge Instruction: Secure with tape as directed. Compression Wrap Compression Stockings Add-Ons TCC-EZ Cast Boot, [place] Discharge Instruction: SIZE 3 TCC Electronic Signature(s) Signed: 04/13/2022 11:14:03 AM By: Baruch Gouty RN, BSN Signed: 04/13/2022 11:34:44 AM By: Worthy Rancher Entered By: Worthy Rancher on 04/13/2022 08:39:19 -------------------------------------------------------------------------------- Hope Details Patient Name: Date of Service: [name]. 04/13/2022 8:15 A M Medical Record Number: [id number] Patient Account Number: [id number] Date of Birth/Sex: Treating RN: 1951-05-20 (71 y.o. M) Primary Care Laniesha Das: [name] Other Clinician: Referring Sharita Bienaime: Treating Dalphine Cowie/Extender: Stormy Card in Treatment: 52 Vital Signs Time Taken: 08:10 Temperature (F): 98.7 Height (in): 74 Pulse (bpm):  72 Weight (lbs): 238 Respiratory Rate (breaths/min): 20 Body Mass Index (BMI): 30.6 Blood Pressure (mmHg): 152/83 Capillary Blood Glucose (mg/dl): 119 Reference Range: 80 - 120 mg / dl Notes KELSEN, CELONA ([id number]) [id number] Page 10 of 10 glucose per pt report this am Electronic Signature(s) Signed: 04/13/2022 11:14:03 AM By: Baruch Gouty RN, BSN Entered By: Baruch Gouty on 04/13/2022 08:43:38

## 2022-04-20 ENCOUNTER — Encounter (HOSPITAL_BASED_OUTPATIENT_CLINIC_OR_DEPARTMENT_OTHER): Payer: Medicare Other | Admitting: General Surgery

## 2022-04-20 DIAGNOSIS — I87322 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: Secondary | ICD-10-CM | POA: Diagnosis not present

## 2022-04-24 NOTE — Progress Notes (Signed)
[name] ([id number]) [id number] Page 1 of 18 Visit Report for 04/20/2022 Chief Complaint Document Details Patient Name: Date of Service: [name], [name] 04/20/2022 8:00 A M Medical Record Number: [id number] Patient Account Number: [id number] Date of Birth/Sex: Treating RN: 11-Sep-1950 (71 y.o. M) Primary Care Provider: [name] Other Clinician: Referring Provider: Treating Provider/Extender: [name] in Treatment: 39 Information Obtained from: Patient Chief Complaint Left leg and foot ulcers 04/12/2021; patient is here for wounds on his left lower leg and left plantar foot over the first metatarsal head Electronic Signature(s) Signed: 04/20/2022 8:30:00 AM By: Fredirick Maudlin MD FACS Entered By: Fredirick Maudlin on 04/20/2022 08:29:59 -------------------------------------------------------------------------------- Debridement Details Patient Name: Date of Service: [name]. 04/20/2022 8:00 A M Medical Record Number: [id number] Patient Account Number: [id number] Date of Birth/Sex: Treating RN: 1950-07-05 (71 y.o. [name] Primary Care Provider: [name] Other Clinician: Referring Provider: Treating Provider/Extender: [name] in Treatment: 53 Debridement Performed for Assessment: Wound #22 Left,Lateral Lower Leg Performed By: Physician Fredirick Maudlin, MD Debridement Type: Debridement Level of Consciousness (Pre-procedure): Awake and Alert Pre-procedure Verification/Time Out Yes - 08:25 Taken: Start Time: 08:26 Pain Control: Lidocaine 4% T opical Solution T Area Debrided (L x W): otal 8.1 (cm) x 1.5 (cm) = 12.15 (cm) Tissue and other material debrided: Non-Viable, Eschar Level: Non-Viable Tissue Debridement Description: Selective/Open Wound Instrument: Curette Bleeding: Minimum Hemostasis Achieved: Pressure Procedural Pain: 0 Post Procedural Pain: 0 Response  to Treatment: Procedure was tolerated well Level of Consciousness (Post- Awake and Alert procedure): Post Debridement Measurements of Total Wound Length: (cm) 8.1 Width: (cm) 1.5 Depth: (cm) 0.1 Volume: (cm) 0.954 Character of Wound/Ulcer Post Debridement: Requires Further Debridement Post Procedure Diagnosis [name] ([id number]) [id number] Page 2 of 18 Same as Pre-procedure Notes Scribed for Dr. Celine Ahr by Blanche East, RN Electronic Signature(s) Signed: 04/20/2022 9:14:53 AM By: Fredirick Maudlin MD FACS Signed: 04/24/2022 4:16:20 PM By: Blanche East RN Entered By: Blanche East on 04/20/2022 08:27:45 -------------------------------------------------------------------------------- HPI Details Patient Name: Date of Service: [name]. 04/20/2022 8:00 A M Medical Record Number: [id number] Patient Account Number: [id number] Date of Birth/Sex: Treating RN: 1951-03-01 (71 y.o. M) Primary Care Provider: [name] Other Clinician: Referring Provider: Treating Provider/Extender: [name] in Treatment: 76 History of Present Illness HPI Description: 10/11/17; [name] is a 71 year old man who tells me that in 2015 he slipped down the latter traumatizing his left leg. He developed a wound in the same spot the area that we are currently looking at. He states this closed over for the most part although he always felt it was somewhat unstable. In 2016 he hit the same area with the door of his car had this reopened. He tells me that this is never really closed although sometimes an inflow it remains open on a constant basis. He has not been using any specific dressing to this except for topical antibiotics the nature of which were not really sure. His primary doctor did send him to see Dr. [name] of interventional cardiology. He underwent an angiogram on 08/06/17 and he underwent a PTA and directional atherectomy of the lesser  distal SFA and popliteal arteries which resulted in brisk improvement in blood flow. It was noted that he had 2 vessel runoff through the anterior tibial and peroneal. He is also been to see vascular and interventional radiologist. He was not felt to have any significant superficial venous insufficiency. Presumably is not  a candidate for any ablation. It was suggested he come here for wound care. The patient is a type II diabetic on insulin. He also has a history of venous insufficiency. ABIs on the left were noncompressible in our clinic 10/21/17; patient we admitted to the clinic last week. 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 We put Iodosorb on him after an aggressive debridement and 3 layer compression. He complained of pain in his ankle and itching with is skin in fact he scratched the area on the medial calf superiorly at the rim of our wraps and he has 2 small open areas in that location today which are new. I changed his primary dressing today to silver collagen. As noted he is already had revascularization and does not have any significant superficial venous insufficiency that would be amenable to ablation 10/28/17; patient admitted to the clinic 2 weeks ago. He has a smaller Wound. Scratch injury from last week revealed. There is large wound over the tibial area. This is smaller. Granulation looks healthy. No need for debridement. 11/04/17; the wound on the left lateral calf looks better. Improved dimensions. Surface of this looks better. We've been maintaining him and Kerlix Coban wraps. He finds this much more comfortable. Silver collagen dressing 11/11/17; left lateral Wound continues to look healthy be making progress. Using a #5 curet I removed removed nonviable skin from the surface of the wound and then necrotic debris from the wound surface. Surface of the wound continues to look healthy. He also has an open area on  the left great toenail bed. We've been using topical antibiotics. 11/19/17; left anterior lateral wound continues to look healthy but it's not closed. He also had a small wound above this on the left leg Initially traumatic wounds in the setting of significant chronic venous insufficiency and stasis dermatitis 11/25/17; left anterior wounds superiorly is closed still a small wound inferiorly. 12/02/17; left anterior tibial area. Arrives today with adherent callus. Post debridement clearly not completely closed. Hydrofera Blue under 3 layer compression. 12/09/17; left anterior tibia. Circumferential eschar however the wound bed looks stable to improved. We've been using Hydrofera Blue under 3 layer compression 12/17/17; left anterior tibia. Apparently this was felt to be closed however when the wrap was taken off there is a skin tear to reopen wounds in the same area we've been using Hydrofera Blue under 3 layer compression 12/23/17 left anterior tibia. Not close to close this week apparently the [facility] was stuck to this again. Still circumferential eschar requiring debridement. I put a contact layer on this this time under the Hydrofera Blue 12/31/17; left anterior tibia. Wound is better slight amount of hyper-granulation. Using Hydrofera Blue over Adaptic. 01/07/18; left anterior tibia. The wound had some surface eschar however after this was removed he has no open wound.he was already revascularized by Dr. [name] when he came to our clinic with atherectomy of the left SFA and popliteal artery. He was also sent to interventional radiology for venous reflux studies. He was not felt to have significant reflux but certainly has chronic venous changes of his skin with hemosiderin deposition around this area. He will definitely need to lubricate his skin and wear compression stocking and I've talked to him about this. READMISSION 05/26/2018 This is a now 71 year old man we cared for with traumatic wounds  on his left anterior lower extremity. He had been previously revascularized during that admission by Dr.  Ganji. Apparently in follow-up Dr. [name] noted that he had deterioration in his arterial status. He underwent a stent placement in the distal left SFA on 04/22/2018. 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 He went back to the angiography suite on 04/30/2018 he underwent PTA and balloon angioplasty of the occluded left mid anterior tibial artery, thrombotic occlusion went from 100 to 0% which reconstitutes the posterior tibial artery. He had thrombectomy and aspiration of the peroneal artery. The stent placed in the distal SFA left SFA was still occluded. 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 [name], [name] ([id number]) [id number] Page 3 of 18 He has been recuperating at [place] [facility] in [facility] after the most recent hospitalization. He is going home tomorrow. He tells me that 2 weeks ago he traumatized the tip of his left fifth toe. He came in urgently for our review of this. This was a history of before I noted that Dr. [name] had already noted dry gangrenous changes of the left fifth toe 06/09/2018; 2-week follow-up. I did contact Dr. [name] after his last appointment and he apparently saw 1 of Dr. Irven Shelling colleagues the next day. He does not follow-up with Dr. [name] himself until Thursday of this week. 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 Nevertheless there is no evidence of infection no drainage and no pain. He had a new area that this week when we were signing him in today on the left anterior mid tibia area, this is in close proximity to the previous wound we have  dealt with in this clinic. 06/23/2018; 2-week follow-up. I did not receive a recent note from Dr. [name] to review today. Our office is trying to obtain this. He is apparently not planning to do further vascular interventions and wondered about compression to try and help with the patient's chronic venous insufficiency. However we are also concerned about the arterial flow. He arrives in clinic today with a new area on the left third toe. The areas on the calf/anterior tibia are close to closing. The left fifth toe is still mummified using Betadine. -In reviewing things with the patient he has what sounds like claudication with mild to moderate amount of activity. 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 I prescribed Levaquin over the phone while we attempted to arrange a plan of care. However the patient called yesterday to report he had low-grade fever and he came in today acutely. There is been a marked deterioration in the left third toe with spreading cellulitis up into the dorsal left foot. He was referred to the emergency room. Readmission: 06/29/2020 patient presents today for reevaluation here in our clinic he was previously treated by Dr. Dellia Nims at the latter part of 2019 in 2 the beginning of 2020. Subsequently we have not seen him since that time in the interim he did have evaluation with vein and vascular specialist specifically Dr. Anice Paganini who did perform quite extensive work for a left femoral to anterior tibial artery bypass. With that being said in the interim the patient has developed significant lymphedema and has wounds that he tells me have really never healed in regard to the incision  site on the left leg. He also has multiple wounds on the feet for various reasons some of which is that he tends to pick at his feet. Fortunately there is no signs of active infection systemically at this time he does have some wounds that are little bit deeper  but most are fairly superficial he seems to have good blood flow and overall everything appears to be healthy I see no bone exposed and no obvious signs of osteomyelitis. I do not know that he necessarily needs a x-ray at this point although that something we could consider depending on how things progress. The patient does have a history of lymphedema, diabetes, this is type II, chronic kidney disease stage III, hypertension, and history of peripheral vascular disease. 07/05/2020; patient admitted last week. Is a patient I remember from 2019 he had a spreading infection involving the left foot and we sent him to the hospital. 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 He subsequently had a left femoral to anterior tibial bypass by Dr.Cain vein and vascular. He also has severe lymphedema with chronic skin changes related to that on the left leg. The most problematic area that was new today was on the left medial great toe. This was apparently a small area last week there was purulent drainage which our intake nurse cultured. Also areas on the left medial foot and heel left lateral foot. He has 2 areas on the left medial calf left lateral calf in the setting of the severe lymphedema. 07/13/2020 on evaluation today patient appears to be doing better in my opinion compared to his last visit. The good news is there is no signs of active infection systemically and locally I do not see any signs of infection either. He did have an x-ray which was negative that is great news he had a culture which showed MRSA but at the same time he is been on the doxycycline which has helped. I do think we may want to extend this for 7 additional days 1/25; patient admitted to the clinic a few weeks ago. 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 We have been putting him under compression his edema control is a lot better but he is severe  verricused skin on the left leg. He is really done quite well he still has an open area on the left medial calf and the left medial first metatarsal head. We have been using silver collagen on the leg silver alginate on the foot 07/27/2020 upon evaluation today patient appears to be doing decently well in regard to his wounds. He still has a lot of dry skin on the left leg. Some of this is starting to peel back and I think he may be able to have them out by removing some that today. Fortunately there is no signs of active infection at this time on the left leg although on the right leg he does appear to have swelling and erythema as well as some mild warmth to touch. This does have been concerned about the possibility of cellulitis although within the differential diagnosis I do think that potentially a DVT has to be at least considered. We need to rule that out before proceeding would just call in the cellulitis. Especially since he is having pain in the posterior aspect of his calf muscle. 2/8; the patient had seen sparingly. He has severe skin changes of chronic lymphedema  in the left leg thickened hyperkeratotic verrucous skin. He has an open wound on the medial part of the left first met head left mid tibia. He also has a rim of nonepithelialized skin in the anterior mid tibia. He brought in the AmLactin lotion that was been prescribed although I am not sure under compression and its utility. There concern about cellulitis on the right lower leg the last time he was here. He was put on on antibiotics. His DVT rule out was negative. The right leg looks fine he is using his stocking on this area 08/10/2020 upon evaluation today patient appears to be doing well with regard to his leg currently. He has been tolerating the dressing changes without complication. Fortunately there is no signs of active infection which is great news. Overall very pleased with where things stand. 2/22; the patient still has  an area on the medial part of the left first met his head. This looks better than when I last saw this earlier this month he has a rim of epithelialization but still some surface debris. Mostly everything on the left leg is healed. There is still a vulnerable in the left mid tibia area. 08/30/2020 upon evaluation today patient appears to be doing much better in regard to his wounds on his foot. 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 Nonetheless I think we will address that today I Minna send in a prescription for him in that regard. Overall though there does not appear to be any signs of significant worsening. 09/07/2020 on evaluation today patient's wounds over his left foot appear to be doing excellent. I do not see any signs of infection there is some callus buildup this can require debridement for certain but overall I feel like he is managing quite nicely. He still using the AmLactin cream which has been beneficial for him as well. 3/22; left foot wound is closed. There is no open area here. He is using ammonium lactate lotion to the lower extremities to help exfoliate dry cracked skin. He has compression stockings from elastic therapy in [place]. The wound on the medial part of his left first met head is healed today. READMISSION 04/12/2021 Mr. Hennick is a patient we know fairly well he had a prolonged stay in clinic in 2019 with wounds on his left lateral and left anterior lower extremity in the setting of chronic venous insufficiency. More recently he was here earlier this year with predominantly an area on his left foot first metatarsal head plantar and he says the plantar foot broke down on its not long after we discharged him but he did not come back here. The last few months areas of broken down on his left anterior and again the left lateral lower  extremity. The leg itself is very swollen chronically enlarged a lot of hyperkeratotic dry Berry Q skin in the left lower leg. His edema extends well into the thigh. He was seen by Dr. Donzetta Matters. He had ABIs on 03/02/2021 showing an ABI on the right of 1 with a TBI of 0.72 his ABI in the left at 1.09 TBI of 0.99. Monophasic and biphasic waveforms on the right. On the left monophasic waveforms were noted he went on to have an angiogram on 03/27/2021 this showed the aortic aortic and iliac segments were free of flow-limiting stenosis the left common femoral vein  to evaluate the left femoral to anterior tibial artery bypass was unobstructed the bypass was patent without any areas of stenosis. We discharged the patient in bilateral juxta lite stockings but very clearly that was not sufficient to control the swelling and maintain skin integrity. He is clearly going to need compression pumps. 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 This is fortunate because he is on his feet for long periods of time. 10/27; patient comes in with our intake nurse reporting copious amount of green drainage from the left anterior mid tibia the left dorsal foot and to a lesser extent the left medial mid tibia. We left the compression wrap on all week for the amount of edema in his left leg is quite a bit better. We use silver alginate as [name], [name] ([id number]) [id number] Page 4 of 18 the primary dressing 11/3; edema control is good. Left anterior lower leg left medial lower leg and the plantar first metatarsal head. The left anterior lower leg required debridement. Deep tissue culture I did of this wound showed MRSA I put him on 10 days of doxycycline which she will start today. We have him in compression wraps. He has a security card and [employer] however he is retiring on November 15. We will need to then get him into a better offloading boot for the  left foot perhaps a total contact cast 11/10; edema control is quite good. Left anterior and left medial lower leg wounds in the setting of chronic venous insufficiency and lymphedema. He also has a substantial area over the left plantar first metatarsal head. 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 He has significant hypergranulation on the left plantar foot wound. The patient is a diabetic but he does not have significant PAD 11/17; edema control is quite good. Left anterior and left medial lower leg wounds look better. The really concerning area remains the area on the left plantar first metatarsal head. He has a rim of epithelialization. 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 Starting today with a forefoot off loader but . possibly a total contact cast. 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 Arrives with a very significant odor from I think all of the wounds on the left leg and the left foot. Because of this I did not put a total contact cast on him today but will could still consider this. His wife was having cataract surgery which is the reason he missed the appointment 12/6. I saw this man 5 days ago with a swelling below the popliteal fossa. I thought he actually might have a Baker's cyst however the DVT rule out study that we could arrange right away was negative the technician told me this was not a ruptured Baker's cyst. We attempted to get this aspirated by under ultrasound guidance in interventional radiology however all they did was an ultrasound however it shows an extensive fluid collection 62 x 8 x 9.4 in the left thigh and left calf. The patient states he thinks  this started 8 days ago or  so but he really is not complaining of any pain, fever or systemic symptoms. He has not ha 12/20; after some difficulty I managed to get the patient into see Dr. Donzetta Matters. Eventually he was taken into the hospital and had a drain put in the fluid collection below his left knee posteriorly extending into the posterior thigh. He still has the drain in place. Culture of this showed moderate staff aureus few Morganella and few Klebsiella he is now on doxycycline and ciprofloxacin as suggested by infectious disease he is on this for a month. The drain will remain in place until it stops draining 12/29; he comes in today with the 1 wound on his left leg and the area on the left plantar first met head significantly smaller. Both look healthy. He still has the drain in the left leg. He says he has to change this daily. Follows up with Dr. Donzetta Matters on January 11. 06/29/2021; the wounds that I am following on the left leg and left first met head continued to be quite healthy. However the area where his inferior drain is in place had copious amounts of drainage which was green in color. The wound here is larger. Follows up with Dr. Gwenlyn Saran of vein and vascular his surgeon next week as well as infectious disease. He remains on ciprofloxacin and doxycycline. He is not complaining of excessive pain in either one of the drain areas 1/12; the patient saw vascular surgery and infectious disease. Vascular surgery has left the drain in place as there was still some notable drainage still see him back in 2 weeks. Dr. Velna Ochs stop the doxycycline and ciprofloxacin and I do not believe he follows up with them at this point. Culture I did last week showed both doxycycline resistant MRSA and Pseudomonas not sensitive to ciprofloxacin although only in rare titers 1/19; the patient's wound on the left anterior lower leg is just about healed. We have continued healing of the area that was medially on the left leg.  Left first plantar metatarsal head continues to get smaller. The major problem here is his 2 drain sites 1 on the left upper calf and lateral thigh. There is purulent drainage still from the left lateral thigh. I gave him antibiotics last week but we still have recultured. He has the drain in the area I think this is eventually going to have to come out. I suspect there will be a connecting wound to heal here perhaps with improved VAc 1/26; the patient had his drain removed by vein and vascular on 1/25/. This was a large pocket of fluid in his left thigh that seem to tunnel into his left upper calf. 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 His mention his Penrose drain was removed today. He now has a tunneling wound on his left calf and left thigh. Both of these probe widely towards each other although I cannot really prove that they connect. Both wounds on his lower leg anteriorly are closed and his area over the first metatarsal head on his right foot continues to improve. We are using Hydrofera Blue here. He also saw infectious disease culture of the abscess they noted was polymicrobial with MRSA, Morganella and Klebsiella he was treated with doxycycline and ciprofloxacin for 4 weeks ending on 07/03/2021. They did not recommend any further antibiotics. Notable that while he still had the Penrose drain in place last week he had purulent drainage coming out of the inferior IandD site this grew  [facility], MRSA and Pseudomonas but there does not appear to be any active infection in this area today with the drain out and he is not systemically unwell 2/2; with regards to the drain sites the superior one on the thigh actually is closed down the one on the upper left lateral calf measures about 8 and half centimeters which is an improvement seems to be less prominent although still with a lot of drainage. The only remaining wound is over the first metatarsal head on the left foot and  this looks to be continuing to improve with Hydrofera Blue. 2/9; the area on his plantar left foot continues to contract. Callus around the wound edge. The drain sites specifically have not come down in depth. We put the wound VAC on Monday he changed the canister late last night our intake nurse reported a pocket of fluid perhaps caused by our compression wraps 2/16; continued improvement in left foot plantar wound. drainage site in the calf is not improved in terms of depth (wound vac) 2/23; continued improvement in the left foot wound over the first metatarsal head. With regards to the drain sites the area on his thigh laterally is healed however the open area on his calf is small in terms of circumference by still probes in by about 15 cm. Within using the wound VAC. Hydrofera Blue on his foot 08/24/2021: The left first metatarsal head wound continues to improve. The wound bed is healthy with just some surrounding callus. Unfortunately the open drain site on his calf remains open and tunnels at least 15 cm (the extent of a Q-tip). This is despite several weeks of wound VAC treatment. Based on reading back through the notes, there has been really no significant change in the depth of the wound, although the orifice is smaller and the more cranial wound on his thigh has closed. I suspect the tunnel tracks nearly all the way to this location. 08/31/2021: Continued improvement in the left first metatarsal head wound. There has been absolutely no improvement to the long tunnel from his open drain site on his calf. We have tried to get him into see vascular surgery sooner to consider the possibility of simply filleting the tract open and allowing it to heal from the bottom up, likely with a wound VAC. They have not yet scheduled a sooner appointment than his current mid April 09/14/2021: He was seen by vascular surgery and they took him to the operating room last week. They opened a portion of the tunnel,  but did not extend the entire length of the known open subcutaneous tract. I read Dr. Claretha Cooper operative note and it is not clear from that documentation why only a portion of the tract was opened. The heaped up granulation tissue was curetted and removed from at least some portion of the tract. They did place a wound VAC and applied an Unna boot to the leg. The ulcer on his left first metatarsal head is smaller today. The bed looks good and there is just a small amount of surrounding callus. 09/21/2021: The ulcer on his left first metatarsal head looks to be stalled. There is some callus surrounding the wound but the wound bed itself does not appear particularly dynamic. The tunnel tract on his lateral left leg seems to be roughly the same length or perhaps slightly smaller but the wound bed appears healthy with good granulation tissue. He opened up a new wound on his medial thigh and the site of a  prior surgical incision. He says that he did this unconsciously in his sleep by scratching. 09/28/2021: Unfortunately, the ulcer on his left first metatarsal head has extended underneath the callus toward the dorsum of his foot. The medial thigh wounds are roughly the same. The tunnel on his lateral left leg continues to be problematic; it is longer than we are able to actually probe with a Q-tip. I am still not [name], [name] ([id number]) [id number] Page 5 of 18 certain as to why Dr. Donzetta Matters did not open this up entirely when he took the patient to the operating room. We will likely be back in the same situation with just a small superficial opening in a long unhealed tract, as the open portion is granulating in nicely. 10/02/2021: The patient was initially scheduled for a nurse visit, but we are also applying a total contact cast today. The plantar foot wound looks clean without significant accumulated callus. We have been applying Prisma silver collagen to the site. 10/05/2021: The  patient is here for his first total contact cast change. We have tried using gauze packing strips in the tunnel on his lateral leg wound, but this does not seem to be working any better than the white VAC foam. The foot ulcer looks about the same with minimal periwound callus. Medial thigh wound is clean with just some overlying eschar. 10/12/2021: The plantar foot wound is stable without any significant accumulation of periwound callus. The surface is viable with good granulation tissue. The medial thigh wounds are much smaller and are epithelializing. On the other hand, he had purulent drainage coming from the tunnel on his lateral leg. He does go back to see Dr. Donzetta Matters next week and is planning to ask him why the wound tunnel was not completely opened at the time of his most recent operation. 10/19/2021: The plantar foot wound is markedly improved and has epithelial tissue coming through the surface. The medial thigh wounds are nearly closed with just a tiny open area. He did see Dr. Donzetta Matters earlier this week and apparently they did discuss the possibility of opening the sinus tract further and enabling a wound VAC application. Apparently there are some limits as to what Dr. 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 I think if we could get the tract open to the level of the popliteal fossa, this would greatly aid in her ability to get this chart closed. That being said, however, today when I probed the tract with a Q-tip, I was not able to insert the entirety of the Q-tip as I have on previous occasions. The tunnel is shorter by about 4 cm. The surface is clean with good granulation tissue and no further episodes of purulent drainage. 10/30/2021: Last week, the patient underwent surgery and had the long tract in his leg opened. There was a rind that was debrided, according to the operative report. His medial thigh ulcers are closed. The plantar foot wound is clean with a  good surface and some built up surrounding callus. 11/06/2021: The overall dimensions of the large wound on his lateral leg remain about the same, but there is good granulation tissue present and the tunneling is a little bit shorter. He has a new wound on his anterior tibial surface, in the same location where he had a similar lesion in the past. The plantar foot wound is clean with some buildup surrounding callus. Just toward the medial aspect of his foot, however, there is an area  of darkening that once debrided, revealed another opening in the skin surface. 11/13/2021: The anterior tibial surface wound is closed. The plantar foot wound has some surrounding callus buildup. The area of darkening that I debrided last week and revealed an opening in the skin surface has closed again. The tunnel in the large wound on his lateral leg has come in by about 3 cm. There is healthy granulation tissue on the entire wound surface. 11/23/2021: The patient was out of town last week and did wet-to-dry dressings on his large wound. 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 Unfortunately, he picked open the wound on his left medial thigh. He says that it was itching and he just could not stop scratching it until it was open again. The wound on his plantar foot is smaller and has not accumulated a tremendous amount of callus. The lateral leg wound is shallower and the tunnel has also decreased in depth. There is just a little bit of slough accumulation on the surface. 11/30/2021: Another portion of his left medial thigh has been opened up. All of these wounds are fairly superficial with just a little bit of slough and eschar accumulation. The wound on his plantar foot is almost closed with just a bit of eschar and periwound callus accumulation. The lateral leg wound is nearly flush with the surrounding skin and the tunnel is markedly shallower. 12/07/2021:  There is just 1 open area on his left medial thigh. It is clean with just a little bit of perimeter eschar. The wound on his plantar foot continues to contract and just has some eschar and periwound callus accumulation. The lateral leg wound is closing at the more distal aspect and the tunnel is smaller. The surface is nearly flush with the surrounding skin and it has a good bed of granulation tissue. 12/14/2021: The thigh and foot wounds are closed. The lateral leg wound has closed over approximately half of its length. The tunnel continues to contract and the surface is now flush with the surrounding skin. The wound bed has robust granulation tissue. 12/22/2021: The thigh and foot wounds have reopened. The foot wound has a lot of callus accumulation around and over it. The thigh wound is tiny with just a little bit of slough in the wound bed. The lateral leg wound continues to contract. His vascular surgeon took the wound VAC off earlier in the week and the patient has been doing wet-to-dry dressings. There is a little slough accumulation on the surface. The tunnel is about 3 cm in depth at this point. 12/28/2021: The thigh wound is closed again. The foot wound has some callus that subsequently has peeled back exposing just a small slit of a wound. The lateral leg wound Is down to about half the size that it originally was and the tunnel is down to about half a centimeter in depth. 01/04/2022: The thigh wound remains closed. The foot wound has heavy callus overlying the wound site. Once this was debrided, the wound was found to be closed. The lateral leg wound is smaller again this week and very superficial. No tunnel could be identified. 01/12/2022: The thigh and foot wounds both remain closed. The lateral leg wound is now nearly flush with the skin surface. There is good granulation tissue present with a light layer of slough. 01/19/2022: Due to the way his wrap was placed, the patient did not change the  dressing on  his thigh at all and so the foam was saturated and his skin is macerated. There is a light layer of slough on the wound surface. The underlying granulation tissue is robust and healthy-appearing. He has heavy callus buildup at the site of his first metatarsal head wound which is still healed. 02/01/2022: He has been in silver alginate. When he removed the dressing from his thigh wound, however, some leg, superficially reopening a portion of the wound that had healed. In addition, underneath the callus at his left first metatarsal head, there appears to be a blister and the wound appears to be open again. 02/08/2022: The lateral leg wound has contracted substantially. There is eschar and a light layer of slough present. He says that it is starting to pull and is uncomfortable. On inspection, there is some puckering of the scar and the eschar is quite dry; this may account for his symptoms. On his first metatarsal head, the wound is much smaller with just some eschar on the surface. The callus has not reaccumulated. He reports that he had a blister come up on his medial thigh wound at the distal aspect. It popped and there is now an opening in his skin again. Looking back through his [place] of wound photos, there is what looks like a permanent suture just deep to this location and it may be trying to erode through. We have been using silver alginate on his wounds. 02/15/2022: The lateral leg wound is about half the size it was last week. It is clean with just a little perimeter eschar and light slough. The wound on his first metatarsal head is about the same with heavy callus overlying it. The medial thigh wound is closed again. He does have some skin changes on the top of his foot that looks potentially yeast related. 02/22/2022: The skin on the top of his foot improved with the use of a topical antifungal. The lateral leg wound continues to contract and is again smaller this week. There is a  little bit of slough and eschar on the surface. The first metatarsal head wound is a little bit smaller but has reaccumulated a thick callus over the top. He decided to try to trim his toenail and ultimately took the entire nail off of his left great toe. 03/02/2022: His lateral leg wound continues to improve, as does the wound on his left great toe. Unfortunately, it appears that somehow his foot got wet and moisture seeped in through the opening causing his skin to lift. There is a large wound now overlying his first metatarsal on both the plantar, medial, and dorsal portion of his foot. There is necrotic tissue and slough present underneath the shaggy macerated skin. 03/08/2022: The lateral leg wound is smaller again today. There is just a light layer of slough and eschar on the surface. The great toe wound is smaller again today. The first metatarsal wound is a little bit smaller today and does not look nearly as necrotic and macerated. There is still slough and nonviable tissue present. 03/15/2022: The lateral leg wound is narrower and just has a little bit of light slough buildup. The first metatarsal wound still has a fair amount of moisture [name], [name] ([id number]) [id number] Page 6 of 18 affecting the periwound skin. The great toe wound is healed. 03/22/2022: The lateral leg wound is now isolated to just at the level of his knee. There is some eschar and slough accumulation. The first metatarsal head wound has epithelialized tremendously  and is about half the size that it was last week. He still has some maceration on the top of his foot and a fungal odor is present. 03/29/2022: T oday the patient's foot was macerated, suggesting that the cast got wet. The patient has also been picking at his dry skin and has enlarged the wound on his left lateral leg. In the time between having his cast removed and my evaluation, he had picked more dry skin and opened up additional  wounds on his Achilles area and dorsal foot. The plantar first metatarsal head wound, however, is smaller and clean with just macerated callus around the perimeter and light slough on the surface. The lateral leg wound measured a little bit larger but is also fairly clean with eschar and minimal slough. 04/02/2022: The patient had vascular studies done last Friday and so his cast was not applied. He is here today to have that done. 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 04/05/2022: Both wounds are smaller and quite clean. There is just a little biofilm on the lateral leg wound. 10/20; the patient has a wound on the left lateral surgical incision at the level of his lateral knee this looks clean and improved. He is using silver alginate. He also has an area on his left medial foot for which she is using Hydrofera Blue under a total contact cast both wounds are measuring smaller 04/20/2022: The plantar foot wound has contracted considerably and is very close to closing. The lateral leg wound was measured a little larger, but there was a tiny open area that was included in the measurements that was not included last week. He has some eschar around the perimeter but otherwise the wound looks clean. Electronic Signature(s) Signed: 04/20/2022 8:30:57 AM By: Fredirick Maudlin MD FACS Entered By: Fredirick Maudlin on 04/20/2022 08:30:57 -------------------------------------------------------------------------------- Physical Exam Details Patient Name: Date of Service: [name]. 04/20/2022 8:00 A M Medical Record Number: [id number] Patient Account Number: [id number] Date of Birth/Sex: Treating RN: [birth year] (71 y.o. M) Primary Care Provider: [name] Other Clinician: Referring Provider: Treating Provider/Extender: [name] in Treatment: 11 Constitutional Hypertensive, asymptomatic. Slightly bradycardic, asymptomatic.. . . No acute  distress.Marland Kitchen Respiratory Normal work of breathing on room air.. Notes 04/20/2022: The plantar foot wound has contracted considerably and is very close to closing. The lateral leg wound was measured a little larger, but there was a tiny open area that was included in the measurements that was not included last week. He has some eschar around the perimeter but otherwise the wound looks clean. Electronic Signature(s) Signed: 04/20/2022 8:31:35 AM By: Fredirick Maudlin MD FACS Entered By: Fredirick Maudlin on 04/20/2022 08:31:35 -------------------------------------------------------------------------------- Physician Orders Details Patient Name: Date of Service: [name]. 04/20/2022 8:00 A M Medical Record Number: [id number] Patient Account Number: [id number] Date of Birth/Sex: Treating RN: 1951/04/06 (71 y.o. 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 -  Dr Celine Ahr - Room 1 TCC Return A Anesthetic Wound #22 Left,Lateral Lower Leg (In clinic) Topical Lidocaine 4% applied to wound bed Wound #27 Left,Medial Foot (In clinic) Topical Lidocaine 4% applied to wound bed Bathing/ Shower/ Hygiene May shower with protection but do not get wound dressing(s) wet. - Use a  cast protector so you can shower without getting your cast wet Edema Control - Lymphedema / SCD / Other Elevate legs to the level of the heart or above for 30 minutes daily and/or when sitting, a frequency of: - throughout the day Avoid standing for long periods of time. Patient to wear own compression stockings every day. - on right leg; Moisturize legs daily. - Ammonium lactate to right leg daily Compression stocking or Garment 20-30 mm/Hg pressure to: - left leg daily Off-Loading Total Contact Cast to Left Lower Extremity - TCC left leg Other: - minimal weight bearing left foot Wound Treatment Wound #22 - Lower Leg Wound Laterality: Left, Lateral Cleanser: Soap and Water 3 x Per Week/30 Days Discharge Instructions: May shower and wash wound with dial antibacterial soap and water prior to dressing change. Cleanser: Wound Cleanser 3 x Per Week/30 Days Discharge Instructions: Cleanse the wound with wound cleanser prior to applying a clean dressing using gauze sponges, not tissue or cotton balls. Peri-Wound Care: Sween Lotion (Moisturizing lotion) 3 x Per Week/30 Days Discharge Instructions: Apply moisturizing lotion to the leg Prim Dressing: KerraCel Ag Gelling Fiber Dressing, 4x5 in (silver alginate) 3 x Per Week/30 Days ary Discharge Instructions: Apply silver alginate to wound bed as instructed Secondary Dressing: ABD Pad, 8x10 (Generic) 3 x Per Week/30 Days Discharge Instructions: Apply over primary dressing as directed. Secured With: Elastic Bandage 4 inch (ACE bandage) 3 x Per Week/30 Days Discharge Instructions: Secure with ACE bandage as directed. Wound #27 - Foot Wound Laterality: Left, Medial Peri-Wound Care: Sween Lotion (Moisturizing lotion) 1 x Per Week Discharge Instructions: Apply moisturizing lotion as directed Prim Dressing: Hydrofera Blue Classic Foam, 4x4 in 1 x Per Week ary Discharge Instructions: Moisten with saline prior to applying to wound bed Secondary  Dressing: Optifoam Non-Adhesive Dressing, 4x4 in 1 x Per Week Discharge Instructions: Apply over primary dressing cut to make foam donut Secondary Dressing: Woven Gauze Sponges 2x2 in 1 x Per Week Discharge Instructions: Apply over primary dressing as directed. Secured With: 67M Medipore H Soft Cloth Surgical T ape, 4 x 10 (in/yd) 1 x Per Week Discharge Instructions: Secure with tape as directed. Add-Ons: TCC-EZ Cast Boot, Charcot,Large 1 x Per Week Discharge Instructions: SIZE 3 TCC ALBION, WEATHERHOLTZ ([id number]) [id number] Page 8 of 18 Electronic Signature(s) Signed: 04/20/2022 9:14:53 AM By: Fredirick Maudlin MD FACS Entered By: Fredirick Maudlin on 04/20/2022 08:31:59 -------------------------------------------------------------------------------- Problem List Details Patient Name: Date of Service: [name]. 04/20/2022 8:00 A M Medical Record Number: [id number] Patient Account Number: [id number] Date of Birth/Sex: Treating RN: 1951/04/05 (71 y.o. [name] Primary Care Provider: [name] Other Clinician: Referring Provider: Treating Provider/Extender: [name] in Treatment: 01 Active Problems ICD-10 Encounter Code Description Active Date MDM Diagnosis 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 04/12/2021 No Yes I89.0 Lymphedema, not elsewhere classified 04/12/2021 No Yes I87.322 Chronic venous hypertension (idiopathic) with inflammation of left lower 04/12/2021 No Yes extremity L97.828 Non-pressure chronic ulcer of other part of left lower leg with other specified 04/12/2021 No Yes severity L97.528 Non-pressure chronic ulcer of other part of left foot with other specified 04/12/2021  No Yes severity Inactive Problems ICD-10 Code Description Active Date Inactive Date E11.621 Type 2 diabetes mellitus with foot ulcer 04/12/2021 04/12/2021 E11.42 Type 2 diabetes mellitus  with diabetic polyneuropathy 04/12/2021 04/12/2021 L02.416 Cutaneous abscess of left lower limb 06/13/2021 06/13/2021 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 M) Primary Care Provider: [name] Other Clinician: Referring Provider: Treating Provider/Extender: [name] in Treatment: 55 Subjective Chief Complaint Information obtained from Patient Left leg and foot ulcers 04/12/2021; patient is here for wounds on his left lower leg and left plantar foot over the first metatarsal head History of Present Illness (HPI) 10/11/17; Mr. Vantrease is a 71 year old man who tells me that in 2015 he slipped down the latter traumatizing his left leg. He developed a wound in the same spot the area that we are currently looking at. He states this closed over for the most part although he always felt it was somewhat unstable. In 2016 he hit the same area with the door of his car had this reopened. He tells me that this is never really closed although sometimes an inflow it remains open on a constant basis. He has not been using any specific dressing to this except for topical antibiotics the nature of which were not really sure. His primary doctor did send him to see Dr. [name] of interventional cardiology. He underwent an angiogram on 08/06/17 and he underwent a PTA and directional atherectomy of the lesser  distal SFA and popliteal arteries which resulted in brisk improvement in blood flow. It was noted that he had 2 vessel runoff through the anterior tibial and peroneal. He is also been to see vascular and interventional radiologist. He was not felt to have any significant superficial venous insufficiency. Presumably is not a candidate for any ablation. It was suggested he come here for wound care. The patient is a type II diabetic on insulin. He also has a history of venous insufficiency. ABIs on the left were noncompressible in our clinic 10/21/17; patient we admitted to the clinic last week. 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 We put Iodosorb on him after an aggressive debridement and 3 layer compression. He complained of pain in his ankle and itching with is skin in fact he scratched the area on the medial calf superiorly at the rim of our wraps and he has 2 small open areas in that location today which are new. I changed his primary dressing today to silver collagen. As noted he is already had revascularization and does not have any significant superficial venous insufficiency that would be amenable to ablation 10/28/17; patient admitted to the clinic 2 weeks ago. He has a smaller Wound. Scratch injury from last week revealed. There is large wound over the tibial area. This is smaller. Granulation looks healthy. No need for debridement. 11/04/17; the wound on the left lateral calf looks better. Improved dimensions. Surface of this looks better. We've been maintaining him and Kerlix Coban wraps. He finds this much more comfortable. Silver collagen dressing 11/11/17; left lateral  Wound continues to look healthy be making progress. Using a #5 curet I removed removed nonviable skin from the surface of the wound and then necrotic debris from the wound surface. Surface of the wound continues to look healthy. ooHe also has an open area  on the left great toenail bed. We've been using topical antibiotics. 11/19/17; left anterior lateral wound continues to look healthy but it's not closed. ooHe also had a small wound above this on the left leg ooInitially traumatic wounds in the setting of significant chronic venous insufficiency and stasis dermatitis 11/25/17; left anterior wounds superiorly is closed still a small wound inferiorly. 12/02/17; left anterior tibial area. Arrives today with adherent callus. Post debridement clearly not completely closed. Hydrofera Blue under 3 layer compression. 12/09/17; left anterior tibia. Circumferential eschar however the wound bed looks stable to improved. We've been using Hydrofera Blue under 3 layer compression 12/17/17; left anterior tibia. Apparently this was felt to be closed however when the wrap was taken off there is a skin tear to reopen wounds in the same area we've been using Hydrofera Blue under 3 layer compression 12/23/17 left anterior tibia. Not close to close this week apparently the [facility] was stuck to this again. Still circumferential eschar requiring debridement. I put a contact layer on this this time under the Hydrofera Blue 12/31/17; left anterior tibia. Wound is better slight amount of hyper-granulation. Using Hydrofera Blue over Adaptic. 01/07/18; left anterior tibia. The wound had some surface eschar however after this was removed he has no open wound.he was already revascularized by Dr. [name] when he came to our clinic with atherectomy of the left SFA and popliteal artery. He was also sent to interventional radiology for venous reflux studies. He was not felt to have significant reflux but certainly has chronic venous changes of his skin with hemosiderin deposition around this area. He will definitely need to lubricate his skin and wear compression stocking and I've talked to him about this. READMISSION 05/26/2018 This is a now 71 year old man we cared for with traumatic  wounds on his left anterior lower extremity. He had been previously revascularized during that admission by Dr. [name]. Apparently in follow-up Dr. [name] noted that he had deterioration in his arterial status. He underwent a stent placement in the distal left SFA on 04/22/2018. 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 He went back to the angiography suite on 04/30/2018 he underwent PTA and balloon angioplasty of the occluded left mid anterior tibial artery, thrombotic occlusion went from 100 to 0% which reconstitutes the posterior tibial artery. He had thrombectomy and aspiration of the peroneal artery. The stent placed in the distal SFA left SFA was still occluded. 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 He has been recuperating at [place] in [facility] after the most recent hospitalization. He is going home tomorrow. He tells me that 2 weeks ago he traumatized the tip of his left fifth toe. He came in urgently for our review of this. This was a history of before I noted that Dr. [name] had already noted dry gangrenous changes of the left fifth toe 06/09/2018; 2-week follow-up. I did contact Dr. [name] after his last appointment and he apparently saw 1 of Dr.  Ganji's colleagues the next day. He does not follow-up with Dr. [name] himself until Thursday of this week. 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 Nevertheless there is no evidence of infection no drainage and no pain. He had a new area that this week when we were signing him in today on the left anterior mid tibia area, this is in close proximity to the previous wound we have dealt with in this clinic. 06/23/2018; 2-week follow-up. I did not  receive a recent note from Dr. [name] to review today. Our office is trying to obtain this. He is apparently not planning to do further vascular interventions and wondered about compression to try and help with the patient's chronic venous insufficiency. However we are also [name], [name] ([id number]) [id number] Page 10 of 18 concerned about the arterial flow. ooHe arrives in clinic today with a new area on the left third toe. The areas on the calf/anterior tibia are close to closing. The left fifth toe is still mummified using Betadine. -In reviewing things with the patient he has what sounds like claudication with mild to moderate amount of activity. 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 I prescribed Levaquin over the phone while we attempted to arrange a plan of care. However the patient called yesterday to report he had low-grade fever and he came in today acutely. There is been a marked deterioration in the left third toe with spreading cellulitis up into the dorsal left foot. He was referred to the emergency room. Readmission: 06/29/2020 patient presents today for reevaluation here in our clinic he was previously treated by Dr. Dellia Nims at the latter part of 2019 in 2 the beginning of 2020. Subsequently we have not seen him since that time in the interim he did have evaluation with vein and vascular specialist specifically Dr. Anice Paganini who did perform quite extensive work for a left femoral to anterior tibial artery bypass. With that being said in the interim the patient has developed significant lymphedema and has wounds that he tells me have really never healed in regard to the incision site on the left leg. He also has multiple wounds on the feet for various reasons some of which is that he tends to pick at his feet. Fortunately there is no signs of active infection systemically at this time he does have some wounds that are  little bit deeper but most are fairly superficial he seems to have good blood flow and overall everything appears to be healthy I see no bone exposed and no obvious signs of osteomyelitis. I do not know that he necessarily needs a x-ray at this point although that something we could consider depending on how things progress. The patient does have a history of lymphedema, diabetes, this is type II, chronic kidney disease stage III, hypertension, and history of peripheral vascular disease. 07/05/2020; patient admitted last week. Is a patient I remember from 2019 he had a spreading infection involving the left foot and we sent him to the hospital. 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 He subsequently had a left femoral to anterior tibial bypass by Dr.Cain vein and vascular. He also has severe lymphedema with chronic skin changes related to that on the left leg. The most problematic area that was new today was on the left medial great toe. This was apparently a small area last week there was  purulent drainage which our intake nurse cultured. Also areas on the left medial foot and heel left lateral foot. He has 2 areas on the left medial calf left lateral calf in the setting of the severe lymphedema. 07/13/2020 on evaluation today patient appears to be doing better in my opinion compared to his last visit. The good news is there is no signs of active infection systemically and locally I do not see any signs of infection either. He did have an x-ray which was negative that is great news he had a culture which showed MRSA but at the same time he is been on the doxycycline which has helped. I do think we may want to extend this for 7 additional days 1/25; patient admitted to the clinic a few weeks ago. 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 We have been putting him under compression his edema control is a lot better but  he is severe verricused skin on the left leg. He is really done quite well he still has an open area on the left medial calf and the left medial first metatarsal head. We have been using silver collagen on the leg silver alginate on the foot 07/27/2020 upon evaluation today patient appears to be doing decently well in regard to his wounds. He still has a lot of dry skin on the left leg. Some of this is starting to peel back and I think he may be able to have them out by removing some that today. Fortunately there is no signs of active infection at this time on the left leg although on the right leg he does appear to have swelling and erythema as well as some mild warmth to touch. This does have been concerned about the possibility of cellulitis although within the differential diagnosis I do think that potentially a DVT has to be at least considered. We need to rule that out before proceeding would just call in the cellulitis. Especially since he is having pain in the posterior aspect of his calf muscle. 2/8; the patient had seen sparingly. He has severe skin changes of chronic lymphedema in the left leg thickened hyperkeratotic verrucous skin. He has an open wound on the medial part of the left first met head left mid tibia. He also has a rim of nonepithelialized skin in the anterior mid tibia. He brought in the AmLactin lotion that was been prescribed although I am not sure under compression and its utility. There concern about cellulitis on the right lower leg the last time he was here. He was put on on antibiotics. His DVT rule out was negative. The right leg looks fine he is using his stocking on this area 08/10/2020 upon evaluation today patient appears to be doing well with regard to his leg currently. He has been tolerating the dressing changes without complication. Fortunately there is no signs of active infection which is great news. Overall very pleased with where things stand. 2/22; the  patient still has an area on the medial part of the left first met his head. This looks better than when I last saw this earlier this month he has a rim of epithelialization but still some surface debris. Mostly everything on the left leg is healed. There is still a vulnerable in the left mid tibia area. 08/30/2020 upon evaluation today patient appears to be doing much better in regard to his wounds on his foot. Fortunately there does not appear to be  any signs of active infection systemically though locally we did culture this last week and it does appear that he does have MRSA currently. Nonetheless I think we will address that today I Minna send in a prescription for him in that regard. Overall though there does not appear to be any signs of significant worsening. 09/07/2020 on evaluation today patient's wounds over his left foot appear to be doing excellent. I do not see any signs of infection there is some callus buildup this can require debridement for certain but overall I feel like he is managing quite nicely. He still using the AmLactin cream which has been beneficial for him as well. 3/22; left foot wound is closed. There is no open area here. He is using ammonium lactate lotion to the lower extremities to help exfoliate dry cracked skin. He has compression stockings from elastic therapy in [place]. The wound on the medial part of his left first met head is healed today. READMISSION 04/12/2021 Mr. Mahler is a patient we know fairly well he had a prolonged stay in clinic in 2019 with wounds on his left lateral and left anterior lower extremity in the setting of chronic venous insufficiency. More recently he was here earlier this year with predominantly an area on his left foot first metatarsal head plantar and he says the plantar foot broke down on its not long after we discharged him but he did not come back here. The last few months areas of broken down on his left anterior and again the left  lateral lower extremity. The leg itself is very swollen chronically enlarged a lot of hyperkeratotic dry Berry Q skin in the left lower leg. His edema extends well into the thigh. He was seen by Dr. Donzetta Matters. He had ABIs on 03/02/2021 showing an ABI on the right of 1 with a TBI of 0.72 his ABI in the left at 1.09 TBI of 0.99. Monophasic and biphasic waveforms on the right. On the left monophasic waveforms were noted he went on to have an angiogram on 03/27/2021 this showed the aortic aortic and iliac segments were free of flow-limiting stenosis the left common femoral vein to evaluate the left femoral to anterior tibial artery bypass was unobstructed the bypass was patent without any areas of stenosis. We discharged the patient in bilateral juxta lite stockings but very clearly that was not sufficient to control the swelling and maintain skin integrity. He is clearly going to need compression pumps. 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 This is fortunate because he is on his feet for long periods of time. 10/27; patient comes in with our intake nurse reporting copious amount of green drainage from the left anterior mid tibia the left dorsal foot and to a lesser extent the left medial mid tibia. We left the compression wrap on all week for the amount of edema in his left leg is quite a bit better. We use silver alginate as the primary dressing 11/3; edema control is good. Left anterior lower leg left medial lower leg and the plantar first metatarsal head. The left anterior lower leg required debridement. Deep tissue culture I did of this wound showed MRSA I put him on 10 days of doxycycline which she will start today. We have him in compression wraps. He has a security card and [employer] however he is retiring on November 15. We will need to then get him into  a better offloading boot for the left foot perhaps a total contact cast 11/10; edema control is  quite good. Left anterior and left medial lower leg wounds in the setting of chronic venous insufficiency and lymphedema. He also has a substantial area over the left plantar first metatarsal head. 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 He has significant hypergranulation on the left plantar foot wound. The patient is a diabetic but he does not have significant PAD [name], [name] ([id number]) [id number] Page 11 of 18 11/17; edema control is quite good. Left anterior and left medial lower leg wounds look better. The really concerning area remains the area on the left plantar first metatarsal head. He has a rim of epithelialization. 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 Starting today with a forefoot off loader but . possibly a total contact cast. 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 Arrives with a very significant odor from I think all of the wounds on the left leg and the left foot. Because of this I did not put a total contact cast on him today but will could still consider this. His wife was having cataract surgery which is the reason he missed the appointment 12/6. I saw this man 5 days ago with a swelling below the popliteal fossa. I thought he actually might have a Baker's cyst however the DVT rule out study that we could arrange right away was negative the technician told me this was not a ruptured Baker's cyst. We attempted to get this aspirated by under ultrasound guidance in interventional radiology however all they did was an ultrasound however it shows an extensive fluid collection 62 x 8 x 9.4 in the left thigh and left calf. The patient  states he thinks this started 8 days ago or so but he really is not complaining of any pain, fever or systemic symptoms. He has not ha 12/20; after some difficulty I managed to get the patient into see Dr. Donzetta Matters. Eventually he was taken into the hospital and had a drain put in the fluid collection below his left knee posteriorly extending into the posterior thigh. He still has the drain in place. Culture of this showed moderate staff aureus few Morganella and few Klebsiella he is now on doxycycline and ciprofloxacin as suggested by infectious disease he is on this for a month. The drain will remain in place until it stops draining 12/29; he comes in today with the 1 wound on his left leg and the area on the left plantar first met head significantly smaller. Both look healthy. He still has the drain in the left leg. He says he has to change this daily. Follows up with Dr. Donzetta Matters on January 11. 06/29/2021; the wounds that I am following on the left leg and left first met head continued to be quite healthy. However the area where his inferior drain is in place had copious amounts of drainage which was green in color. The wound here is larger. Follows up with Dr. Gwenlyn Saran of vein and vascular his surgeon next week as well as infectious disease. He remains on ciprofloxacin and doxycycline. He  is not complaining of excessive pain in either one of the drain areas 1/12; the patient saw vascular surgery and infectious disease. Vascular surgery has left the drain in place as there was still some notable drainage still see him back in 2 weeks. Dr. Velna Ochs stop the doxycycline and ciprofloxacin and I do not believe he follows up with them at this point. Culture I did last week showed both doxycycline resistant MRSA and Pseudomonas not sensitive to ciprofloxacin although only in rare titers 1/19; the patient's wound on the left anterior lower leg is just about healed. We have continued healing of the area that was medially on  the left leg. Left first plantar metatarsal head continues to get smaller. The major problem here is his 2 drain sites 1 on the left upper calf and lateral thigh. There is purulent drainage still from the left lateral thigh. I gave him antibiotics last week but we still have recultured. He has the drain in the area I think this is eventually going to have to come out. I suspect there will be a connecting wound to heal here perhaps with improved VAc 1/26; the patient had his drain removed by vein and vascular on 1/25/. This was a large pocket of fluid in his left thigh that seem to tunnel into his left upper calf. 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 His mention his Penrose drain was removed today. He now has a tunneling wound on his left calf and left thigh. Both of these probe widely towards each other although I cannot really prove that they connect. Both wounds on his lower leg anteriorly are closed and his area over the first metatarsal head on his right foot continues to improve. We are using Hydrofera Blue here. He also saw infectious disease culture of the abscess they noted was polymicrobial with MRSA, Morganella and Klebsiella he was treated with doxycycline and ciprofloxacin for 4 weeks ending on 07/03/2021. They did not recommend any further antibiotics. Notable that while he still had the Penrose drain in place last week he had purulent drainage coming out of the inferior IandD site this grew [facility], MRSA and Pseudomonas but there does not appear to be any active infection in this area today with the drain out and he is not systemically unwell 2/2; with regards to the drain sites the superior one on the thigh actually is closed down the one on the upper left lateral calf measures about 8 and half centimeters which is an improvement seems to be less prominent although still with a lot of drainage. The only remaining wound is over the first metatarsal head on the  left foot and this looks to be continuing to improve with Hydrofera Blue. 2/9; the area on his plantar left foot continues to contract. Callus around the wound edge. The drain sites specifically have not come down in depth. We put the wound VAC on Monday he changed the canister late last night our intake nurse reported a pocket of fluid perhaps caused by our compression wraps 2/16; continued improvement in left foot plantar wound. drainage site in the calf is not improved in terms of depth (wound vac) 2/23; continued improvement in the left foot wound over the first metatarsal head. With regards to the drain sites the area on his thigh laterally is healed however the open area on his calf is small in terms of circumference by still probes in by about 15 cm. Within using the wound  VAC. Hydrofera Blue on his foot 08/24/2021: The left first metatarsal head wound continues to improve. The wound bed is healthy with just some surrounding callus. Unfortunately the open drain site on his calf remains open and tunnels at least 15 cm (the extent of a Q-tip). This is despite several weeks of wound VAC treatment. Based on reading back through the notes, there has been really no significant change in the depth of the wound, although the orifice is smaller and the more cranial wound on his thigh has closed. I suspect the tunnel tracks nearly all the way to this location. 08/31/2021: Continued improvement in the left first metatarsal head wound. There has been absolutely no improvement to the long tunnel from his open drain site on his calf. We have tried to get him into see vascular surgery sooner to consider the possibility of simply filleting the tract open and allowing it to heal from the bottom up, likely with a wound VAC. They have not yet scheduled a sooner appointment than his current mid April 09/14/2021: He was seen by vascular surgery and they took him to the operating room last week. They opened a portion of  the tunnel, but did not extend the entire length of the known open subcutaneous tract. I read Dr. Claretha Cooper operative note and it is not clear from that documentation why only a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

## 2022-04-24 NOTE — Progress Notes (Signed)
[name], [name] ([id number]) [id number] Page 1 of 9 Visit Report for 04/20/2022 Arrival Information Details Patient Name: Date of Service: [name], [name] 04/20/2022 8:00 A M Medical Record Number: [id number] Patient Account Number: [id number] Date of Birth/Sex: Treating RN: [name] (71 y.o. [name] Primary Care [name]: [name] Other Clinician: Referring [name]: Treating [name]/Extender: Bonnielee [name] in Treatment: 30 Visit Information History Since Last Visit Added or deleted any medications: No Patient Arrived: Ambulatory Any new allergies or adverse reactions: No Arrival Time: 08:08 Had a fall or experienced change in No Accompanied By: self activities of daily living that may affect Transfer Assistance: None risk of falls: Patient Requires Transmission-Based Precautions: No Signs or symptoms of abuse/neglect since last visito No Patient Has Alerts: Yes Hospitalized since last visit: No Implantable device outside of the clinic excluding No cellular tissue based products placed in the center since last visit: Has Dressing in Place as Prescribed: Yes Pain Present Now: Yes Electronic Signature(s) Signed: 04/24/2022 4:16:20 [name] By: [name] East RN Entered By: [name] on 04/20/2022 08:08:44 -------------------------------------------------------------------------------- Encounter Discharge Information Details Patient Name: Date of Service: [name]. 04/20/2022 8:00 A M Medical Record Number: [id number] Patient Account Number: [id number] Date of Birth/Sex: Treating RN: [birth year] (71 y.o. [name] Primary Care Margreat Widener: [name] Other Clinician: Referring [name]: Treating [name]/Extender: Bonnielee [name] in Treatment: 53 Encounter Discharge Information Items Post Procedure Vitals Discharge Condition: Stable Temperature (F): 98.2 Ambulatory Status:  Ambulatory Pulse (bpm): 58 Discharge Destination: Home Respiratory Rate (breaths/min): 18 Transportation: Private Auto Blood Pressure (mmHg): 153/73 Accompanied By: self Schedule Follow-up Appointment: Yes Clinical Summary of Care: Electronic Signature(s) Signed: 04/24/2022 4:16:20 [name] By: [name] East RN Entered By: [name] on 04/20/2022 09:01:31 [name] ([id number]) [id number] Page 2 of 9 -------------------------------------------------------------------------------- Lower Extremity Assessment Details Patient Name: Date of Service: [name], SANTILLANES 04/20/2022 8:00 A M Medical Record Number: [id number] Patient Account Number: [id number] Date of Birth/Sex: Treating RN: [name] (71 y.o. [name] Primary Care Ervey Fallin: [name] Other Clinician: Referring [name]: Treating [name]/Extender: Bonnielee [name] in Treatment: 53 Edema Assessment Assessed: [name]: No] [Right: No] Edema: [Left: Ye] [Right: s] Calf Left: Right: Point of Measurement: 41 cm From Medial Instep 46 cm Ankle Left: Right: Point of Measurement: 10 cm From Medial Instep 28.5 cm Vascular Assessment Pulses: Dorsalis Pedis Palpable: [Left:Yes] Electronic Signature(s) Signed: 04/24/2022 4:16:20 [name] By: [name] East RN Entered By: [name] on 04/20/2022 08:15:03 -------------------------------------------------------------------------------- Multi Wound Chart Details Patient Name: Date of Service: [name]. 04/20/2022 8:00 A M Medical Record Number: [id number] Patient Account Number: [id number] Date of Birth/Sex: Treating RN: [birth year] (71 y.o. M) Primary Care [name]: [name] Other Clinician: Referring [name]: Treating [name]/Extender: Bonnielee [name] in Treatment: 25 Vital Signs Height(in): 74 Capillary Blood Glucose(mg/dl): 197 Weight(lbs): 238 Pulse(bpm): 58 Body Mass Index(BMI):  30.6 Blood Pressure(mmHg): 153/73 Temperature(F): 98.2 Respiratory Rate(breaths/min): 18 [22:Photos:] [N/A:N/A [id number] Page 3 of 9] Left, Lateral Lower Leg Left, Medial Foot N/A Wound Location: Bump Blister N/A Wounding Event: Cyst Diabetic Wound/Ulcer of the Lower N/A Primary Etiology: Extremity Glaucoma, Sleep Apnea, Glaucoma, Sleep Apnea, N/A Comorbid History: Hypertension, Peripheral Arterial Hypertension, Peripheral Arterial Disease, Peripheral Venous Disease, Disease, Peripheral Venous Disease, Type II Diabetes, Gout, Osteoarthritis, Type II Diabetes, Gout, Osteoarthritis, Neuropathy Neuropathy 06/03/2021 02/01/2022 N/A Date Acquired: 25 11 N/A Weeks of Treatment: Open Open N/A Wound Status: No No N/A Wound Recurrence: Yes No  N/A Clustered Wound: 3 N/A N/A Clustered Quantity: 8.1x1.5x0.1 0.3x0.6x0.1 N/A Measurements L x W x D (cm) 9.543 0.141 N/A A (cm) : rea 0.954 0.014 N/A Volume (cm) : -478.70% 86.20% N/A % Reduction in A rea: 27.70% 86.30% N/A % Reduction in Volume: Full Thickness With Exposed Support Grade 1 N/A Classification: Structures Medium Medium N/A Exudate A mount: Serosanguineous Serosanguineous N/A Exudate Type: red, brown red, brown N/A Exudate Color: Fibrotic scar, thickened scar Distinct, outline attached N/A Wound Margin: Large (67-100%) Large (67-100%) N/A Granulation A mount: Red, Pale Red N/A Granulation Quality: None Present (0%) None Present (0%) N/A Necrotic A mount: Fat Layer (Subcutaneous Tissue): Yes Fat Layer (Subcutaneous Tissue): Yes N/A Exposed Structures: Fascia: No Fascia: No Tendon: No Tendon: No Muscle: No Muscle: No Joint: No Joint: No Bone: No Bone: No Small (1-33%) Small (1-33%) N/A Epithelialization: Debridement - Selective/Open Wound N/A N/A Debridement: Pre-procedure Verification/Time Out 08:25 N/A N/A Taken: Lidocaine 4% Topical Solution N/A N/A Pain  Control: Necrotic/Eschar N/A N/A Tissue Debrided: Non-Viable Tissue N/A N/A Level: 12.15 N/A N/A Debridement A (sq cm): rea Curette N/A N/A Instrument: Minimum N/A N/A Bleeding: Pressure N/A N/A Hemostasis A chieved: 0 N/A N/A Procedural Pain: 0 N/A N/A Post Procedural Pain: Procedure was tolerated well N/A N/A Debridement Treatment Response: 8.1x1.5x0.1 N/A N/A Post Debridement Measurements L x W x D (cm) 0.954 N/A N/A Post Debridement Volume: (cm) Scarring: Yes Callus: Yes N/A Periwound Skin Texture: Dry/Scaly: Yes Dry/Scaly: Yes N/A Periwound Skin Moisture: Maceration: No No Abnormalities Noted No Abnormalities Noted N/A Periwound Skin Color: No Abnormality No Abnormality N/A Temperature: Yes N/A N/A Tenderness on Palpation: Debridement T Contact Cast otal N/A Procedures Performed: Treatment Notes Electronic Signature(s) Signed: 04/20/2022 8:29:53 AM By: Fredirick Maudlin MD FACS Entered By: Fredirick Maudlin on 04/20/2022 08:29:53 -------------------------------------------------------------------------------- Multi-Disciplinary Care Plan Details Patient Name: Date of Service: [name]. 04/20/2022 8:00 A M Medical Record Number: [id number] Patient Account Number: [id number] Date of Birth/Sex: Treating RN: [birth year] [name] (71 y.o. [name] Primary Care Madiline Saffran: [name] Other Clinician: Referring Sheng Pritz: Treating Ethelreda Sukhu/Extender: Velda Shell ([id number]) [id number] Page 4 of 9 Weeks in Treatment: [place] reviewed with physician Active Inactive Venous Leg Ulcer Nursing Diagnoses: Knowledge deficit related to disease process and management Potential for venous Insuffiency (use before diagnosis confirmed) Goals: Patient will maintain optimal edema control Date Initiated: 07/27/2021 Target Resolution Date: 05/10/2022 Goal Status:  Active Interventions: Assess peripheral edema status every visit. Treatment Activities: Therapeutic compression applied : 07/27/2021 Notes: Wound/Skin Impairment Nursing Diagnoses: Impaired tissue integrity Knowledge deficit related to ulceration/compromised skin integrity Goals: Patient will have a decrease in wound volume by X% from date: (specify in notes) Date Initiated: 04/12/2021 Date Inactivated: 01/04/2022 Target Resolution Date: 04/23/2021 Goal Status: Met Patient/caregiver will verbalize understanding of skin care regimen Date Initiated: 01/04/2022 Target Resolution Date: 04/27/2022 Goal Status: Active Ulcer/skin breakdown will have a volume reduction of 30% by week 4 Date Initiated: 04/12/2021 Date Inactivated: 04/27/2021 Target Resolution Date: 04/27/2021 Goal Status: Unmet Unmet Reason: infection Ulcer/skin breakdown will have a volume reduction of 50% by week 8 Date Initiated: 04/27/2021 Date Inactivated: 06/29/2021 Target Resolution Date: 06/24/2021 Goal Status: Met Interventions: Assess patient/caregiver ability to obtain necessary supplies Assess patient/caregiver ability to perform ulcer/skin care regimen upon admission and as needed Assess ulceration(s) every visit Notes: Electronic Signature(s) Signed: 04/24/2022 4:16:20 [name] By: [name] East RN Entered By: [name] on 04/20/2022 08:20:16 -------------------------------------------------------------------------------- Pain Assessment Details Patient Name: Date  of Service: [name], [name] 04/20/2022 8:00 A M Medical Record Number: [id number] Patient Account Number: [id number] Date of Birth/Sex: Treating RN: [name] (71 y.o. [name] Primary Care Leyli Kevorkian: [name] Other Clinician: Referring Kateri Balch: Treating Kayana Thoen/Extender: Bonnielee [name] in Treatment: [address] [place]TER[place], GERST ([id number]) [id number] Page 5 of 9 Location of Pain  Severity and Description of Pain Patient Has Paino Yes Site Locations Rate the pain. Current Pain Level: 7 Character of Pain Describe the Pain: Aching, Burning Pain Management and Medication Current Pain Management: Electronic Signature(s) Signed: 04/24/2022 4:16:20 [name] By: [name] East RN Entered By: [name] on 04/20/2022 08:09:26 -------------------------------------------------------------------------------- Patient/Caregiver Education Details Patient Name: Date of Service: [name] 10/27/2023andnbsp8:00 A M Medical Record Number: [id number] Patient Account Number: [id number] Date of Birth/Gender: Treating RN: [name] [name] (71 y.o. [name] Primary Care Physician: [name] Other Clinician: Referring Physician: Treating Physician/Extender: Bonnielee [name] in Treatment: 16 Education Assessment Education Provided To: Patient Education Topics Provided Wound/Skin Impairment: Methods: Explain/Verbal Responses: Reinforcements needed, State content correctly Electronic Signature(s) Signed: 04/24/2022 4:16:20 [name] By: [name] East RN Entered By: [name] on 04/20/2022 08:20:29 [name] ([id number]) [id number] Page 6 of 9 -------------------------------------------------------------------------------- Wound Assessment Details Patient Name: Date of Service: [name], [name] 04/20/2022 8:00 A M Medical Record Number: [id number] Patient Account Number: [id number] Date of Birth/Sex: Treating RN: [birth year] [name] (71 y.o. [name] Primary Care Akshitha Culmer: [name] Other Clinician: Referring Anaiza Behrens: Treating Winslow Ederer/Extender: Bonnielee [name] in Treatment: 53 Wound Status Wound Number: 22 Primary Cyst Etiology: Wound Location: Left, Lateral Lower Leg Wound Open Wounding Event: Bump Status: Date Acquired: 06/03/2021 Comorbid Glaucoma, Sleep Apnea, Hypertension, Peripheral  Arterial Disease, Weeks Of Treatment: 45 History: Peripheral Venous Disease, Type II Diabetes, Gout, Osteoarthritis, Clustered Wound: Yes Neuropathy Photos Wound Measurements Length: (cm) Width: (cm) Depth: (cm) Clustered Quantity: Area: (cm) Volume: (cm) 8.1 % Reduction in Area: -478.7% 1.5 % Reduction in Volume: 27.7% 0.1 Epithelialization: Small (1-33%) 3 Tunneling: No 9.543 Undermining: No 0.954 Wound Description Classification: Full Thickness With Exposed Support Structures Wound Margin: Fibrotic scar, thickened scar Exudate Amount: Medium Exudate Type: Serosanguineous Exudate Color: red, brown Foul Odor After Cleansing: No Slough/Fibrino No Wound Bed Granulation Amount: Large (67-100%) Exposed Structure Granulation Quality: Red, Pale Fascia Exposed: No Necrotic Amount: None Present (0%) Fat Layer (Subcutaneous Tissue) Exposed: Yes Tendon Exposed: No Muscle Exposed: No Joint Exposed: No Bone Exposed: No Periwound Skin Texture Texture Color No Abnormalities Noted: No No Abnormalities Noted: Yes Scarring: Yes Temperature / Pain Temperature: No Abnormality Moisture No Abnormalities Noted: No Tenderness on Palpation: Yes Dry / Scaly: Yes Treatment Notes Wound #22 (Lower Leg) Wound Laterality: Left, Lateral Cleanser Soap and Water Discharge Instruction: May shower and wash wound with dial antibacterial soap and water prior to dressing change. Wound Cleanser Discharge Instruction: Cleanse the wound with wound cleanser prior to applying a clean dressing using gauze sponges, not tissue or cotton balls. [name], [name] ([id number]) [id number] Page 7 of 9 Peri-Wound Care Sween Lotion (Moisturizing lotion) Discharge Instruction: Apply moisturizing lotion to the leg Topical Primary Dressing KerraCel Ag Gelling Fiber Dressing, 4x5 in (silver alginate) Discharge Instruction: Apply silver alginate to wound bed as instructed Secondary  Dressing ABD Pad, 8x10 Discharge Instruction: Apply over primary dressing as directed. Secured With Elastic Bandage 4 inch (ACE bandage) Discharge Instruction: Secure with ACE bandage as directed. Compression Wrap Compression Stockings Add-Ons Electronic Signature(s) Signed: 04/24/2022 4:16:20 [name] By:  Zochol, Jamie RN Entered By: [name] on 04/20/2022 08:17:34 -------------------------------------------------------------------------------- Wound Assessment Details Patient Name: Date of Service: [name], [name] 04/20/2022 8:00 A M Medical Record Number: [id number] Patient Account Number: [id number] Date of Birth/Sex: Treating RN: [birth year] [name] (71 y.o. [name] Primary Care Yida Hyams: [name] Other Clinician: Referring Nikiesha Milford: Treating Saree Krogh/Extender: Bonnielee [name] in Treatment: 53 Wound Status Wound Number: 27 Primary Diabetic Wound/Ulcer of the Lower Extremity Etiology: Wound Location: Left, Medial Foot Wound Open Wounding Event: Blister Status: Date Acquired: 02/01/2022 Comorbid Glaucoma, Sleep Apnea, Hypertension, Peripheral Arterial Disease, Weeks Of Treatment: 11 History: Peripheral Venous Disease, Type II Diabetes, Gout, Osteoarthritis, Clustered Wound: No Neuropathy Photos Wound Measurements Length: (cm) 0.3 Width: (cm) 0.6 Depth: (cm) 0.1 Area: (cm) 0.141 Volume: (cm) 0.014 [name], [name] ([id number]) Wound Description Classification: Grade 1 Wound Margin: Distinct, outline attached Exudate Amount: Medium Exudate Type: Serosanguineous Exudate Color: red, brown Foul Odor After Cleansing: No Slough/Fibrino No % Reduction in Area: 86.2% % Reduction in Volume: 86.3% Epithelialization: Small (1-33%) Tunneling: No Undermining: No [id number] Page 8 of 9 Wound Bed Granulation Amount: Large (67-100%) Exposed Structure Granulation Quality: Red Fascia Exposed: No Necrotic Amount: None Present  (0%) Fat Layer (Subcutaneous Tissue) Exposed: Yes Tendon Exposed: No Muscle Exposed: No Joint Exposed: No Bone Exposed: No Periwound Skin Texture Texture Color No Abnormalities Noted: No No Abnormalities Noted: Yes Callus: Yes Temperature / Pain Temperature: No Abnormality Moisture No Abnormalities Noted: No Dry / Scaly: Yes Maceration: No Treatment Notes Wound #27 (Foot) Wound Laterality: Left, Medial Cleanser Peri-Wound Care Sween Lotion (Moisturizing lotion) Discharge Instruction: Apply moisturizing lotion as directed Topical Primary Dressing Hydrofera Blue Classic Foam, 4x4 in Discharge Instruction: Moisten with saline prior to applying to wound bed Secondary Dressing Optifoam Non-Adhesive Dressing, 4x4 in Discharge Instruction: Apply over primary dressing cut to make foam donut Woven Gauze Sponges 2x2 in Discharge Instruction: Apply over primary dressing as directed. Secured With 23M Medipore H Soft Cloth Surgical T ape, 4 x 10 (in/yd) Discharge Instruction: Secure with tape as directed. Compression Wrap Compression Stockings Add-Ons TCC-EZ Cast Boot, Leslie Discharge Instruction: SIZE 3 TCC Electronic Signature(s) Signed: 04/24/2022 4:16:20 [name] By: [name] East RN Entered By: [name] on 04/20/2022 08:18:09 -------------------------------------------------------------------------------- [place] Details Patient Name: Date of Service: [name]. 04/20/2022 8:00 A Kathrin Penner ([id number]) [id number] Page 9 of 9 Medical Record Number: [id number] Patient Account Number: [id number] Date of Birth/Sex: Treating RN: [birth year] (71 y.o. [name] Primary Care Kasheena Sambrano: [name] Other Clinician: Referring Jacquis Paxton: Treating Joss Friedel/Extender: Bonnielee [name] in Treatment: 36 Vital Signs Time Taken: 08:08 Temperature (F): 98.2 Height (in): 74 Pulse (bpm): 58 Weight (lbs):  238 Respiratory Rate (breaths/min): 18 Body Mass Index (BMI): 30.6 Blood Pressure (mmHg): 153/73 Capillary Blood Glucose (mg/dl): 197 Reference Range: 80 - 120 mg / dl Electronic Signature(s) Signed: 04/24/2022 4:16:20 [name] By: [name] East RN Entered By: [name] on 04/20/2022 08:09:14

## 2022-04-27 ENCOUNTER — Encounter (HOSPITAL_BASED_OUTPATIENT_CLINIC_OR_DEPARTMENT_OTHER): Payer: Medicare Other | Attending: General Surgery | Admitting: General Surgery

## 2022-04-27 DIAGNOSIS — E1122 Type 2 diabetes mellitus with diabetic chronic kidney disease: Secondary | ICD-10-CM | POA: Insufficient documentation

## 2022-04-27 DIAGNOSIS — L97828 Non-pressure chronic ulcer of other part of left lower leg with other specified severity: Secondary | ICD-10-CM | POA: Insufficient documentation

## 2022-04-27 DIAGNOSIS — N183 Chronic kidney disease, stage 3 unspecified: Secondary | ICD-10-CM | POA: Insufficient documentation

## 2022-04-27 DIAGNOSIS — E1151 Type 2 diabetes mellitus with diabetic peripheral angiopathy without gangrene: Secondary | ICD-10-CM | POA: Insufficient documentation

## 2022-04-27 DIAGNOSIS — I129 Hypertensive chronic kidney disease with stage 1 through stage 4 chronic kidney disease, or unspecified chronic kidney disease: Secondary | ICD-10-CM | POA: Insufficient documentation

## 2022-04-27 DIAGNOSIS — I87332 Chronic venous hypertension (idiopathic) with ulcer and inflammation of left lower extremity: Secondary | ICD-10-CM | POA: Insufficient documentation

## 2022-04-27 DIAGNOSIS — I890 Lymphedema, not elsewhere classified: Secondary | ICD-10-CM | POA: Diagnosis not present

## 2022-04-27 DIAGNOSIS — E11621 Type 2 diabetes mellitus with foot ulcer: Secondary | ICD-10-CM | POA: Insufficient documentation

## 2022-04-27 DIAGNOSIS — L97528 Non-pressure chronic ulcer of other part of left foot with other specified severity: Secondary | ICD-10-CM | POA: Diagnosis not present

## 2022-04-28 NOTE — Progress Notes (Signed)
[name] ([id number]) [id number] Page 1 of 9 Visit Report for 04/27/2022 Arrival Information Details Patient Name: Date of Service: [name], [name] 04/27/2022 10:30 A M Medical Record Number: [id number] Patient Account Number: [id number] Date of Birth/Sex: Treating RN: 26-Jan-1951 (71 y.o. M) Primary Care Barbara Keng: [name] Other Clinician: Referring Elgin Carn: Treating Griffith Santilli/Extender: [name] in Treatment: 27 Visit Information History Since Last Visit All ordered tests and consults were completed: No Patient Arrived: Ambulatory Added or deleted any medications: No Arrival Time: 10:32 Any new allergies or adverse reactions: No Accompanied By: self Had a fall or experienced change in No Transfer Assistance: None activities of daily living that may affect Patient Identification Verified: Yes risk of falls: Secondary Verification Process Completed: Yes Signs or symptoms of abuse/neglect since last visito No Patient Requires Transmission-Based Precautions: No Hospitalized since last visit: No Patient Has Alerts: Yes Implantable device outside of the clinic excluding No cellular tissue based products placed in the center since last visit: Has Dressing in Place as Prescribed: Yes Has Footwear/Offloading in Place as Prescribed: Yes Left: T Contact Cast otal Pain Present Now: Yes Electronic Signature(s) Signed: 04/27/2022 5:27:34 PM By: Baruch Gouty RN, BSN Entered By: Baruch Gouty on 04/27/2022 10:42:41 -------------------------------------------------------------------------------- Encounter Discharge Information Details Patient Name: Date of Service: [name]. 04/27/2022 10:30 A M Medical Record Number: [id number] Patient Account Number: [id number] Date of Birth/Sex: Treating RN: [birth year] (71 y.o. [name] Primary Care Jaevin Medearis: [name] Other Clinician: Referring Jozy Mcphearson: Treating  Ruslan Mccabe/Extender: [name] in Treatment: 37 Encounter Discharge Information Items Post Procedure Vitals Discharge Condition: Stable Temperature (F): 97.8 Ambulatory Status: Ambulatory Pulse (bpm): 58 Discharge Destination: Home Respiratory Rate (breaths/min): 18 Transportation: Private Auto Blood Pressure (mmHg): 159/84 Accompanied By: self Schedule Follow-up Appointment: Yes Clinical Summary of Care: Patient Declined Electronic Signature(s) Signed: 04/27/2022 5:27:34 PM By: Baruch Gouty RN, BSN Entered By: Baruch Gouty on 04/27/2022 11:42:08 [name] ([id number]) [id number] Page 2 of 9 -------------------------------------------------------------------------------- Lower Extremity Assessment Details Patient Name: Date of Service: [name], [name] 04/27/2022 10:30 A M Medical Record Number: [id number] Patient Account Number: [id number] Date of Birth/Sex: Treating RN: August 14, 1950 (71 y.o. [name] Primary Care Betzaira Mentel: [name] Other Clinician: Referring Marsh Heckler: Treating Ryot Burrous/Extender: [name] in Treatment: 54 Edema Assessment Assessed: [name]: No] [Right: No] Edema: [Left: Ye] [Right: s] Calf Left: Right: Point of Measurement: 41 cm From Medial Instep 43.5 cm Ankle Left: Right: Point of Measurement: 10 cm From Medial Instep 28.5 cm Vascular Assessment Pulses: Dorsalis Pedis Palpable: [Left:Yes] Electronic Signature(s) Signed: 04/27/2022 5:27:34 PM By: Baruch Gouty RN, BSN Entered By: Baruch Gouty on 04/27/2022 11:02:02 -------------------------------------------------------------------------------- Multi Wound Chart Details Patient Name: Date of Service: [name]. 04/27/2022 10:30 A M Medical Record Number: [id number] Patient Account Number: [id number] Date of Birth/Sex: Treating RN: 1950/10/10 (71 y.o. M) Primary Care Kristina Mcnorton: [name] Other  Clinician: Referring Rykin Route: Treating Breeann Reposa/Extender: [name] in Treatment: 67 Vital Signs Height(in): 74 Capillary Blood Glucose(mg/dl): 184 Weight(lbs): 238 Pulse(bpm): 58 Body Mass Index(BMI): 30.6 Blood Pressure(mmHg): 159/84 Temperature(F): 97.9 Respiratory Rate(breaths/min): 20 [22:Photos:] [N/A:N/A [id number] Page 3 of 9] Left, Lateral Lower Leg Left, Medial Foot N/A Wound Location: Bump Blister N/A Wounding Event: Cyst Diabetic Wound/Ulcer of the Lower N/A Primary Etiology: Extremity [name], Sleep Apnea, [name], Sleep Apnea, N/A Comorbid History: Hypertension, Peripheral Arterial Hypertension, Peripheral Arterial Disease, Peripheral Venous Disease, Disease, Peripheral Venous Disease, Type II Diabetes,  Gout, Osteoarthritis, Type II Diabetes, Gout, Osteoarthritis, Neuropathy Neuropathy 06/03/2021 02/01/2022 N/A Date Acquired: 76 12 N/A Weeks of Treatment: Open Open N/A Wound Status: No No N/A Wound Recurrence: Yes No N/A Clustered Wound: 3 N/A N/A Clustered Quantity: 8x2.4x0.1 0.1x0.1x0.1 N/A Measurements L x W x D (cm) 15.08 0.008 N/A A (cm) : rea 1.508 0.001 N/A Volume (cm) : -814.50% 99.20% N/A % Reduction in A rea: -14.30% 99.00% N/A % Reduction in Volume: Full Thickness With Exposed Support Grade 1 N/A Classification: Structures Medium Small N/A Exudate A mount: Serosanguineous Serous N/A Exudate Type: red, brown amber N/A Exudate Color: Fibrotic scar, thickened scar Distinct, outline attached N/A Wound Margin: Large (67-100%) None Present (0%) N/A Granulation A mount: Red N/A N/A Granulation Quality: None Present (0%) None Present (0%) N/A Necrotic A mount: Fat Layer (Subcutaneous Tissue): Yes Fascia: No N/A Exposed Structures: Fascia: No Fat Layer (Subcutaneous Tissue): No Tendon: No Tendon: No Muscle: No Muscle: No Joint: No Joint: No Bone: No Bone: No Small  (1-33%) Large (67-100%) N/A Epithelialization: Debridement - Selective/Open Wound N/A N/A Debridement: Pre-procedure Verification/Time Out 11:10 N/A N/A Taken: Slough N/A N/A Tissue Debrided: Non-Viable Tissue N/A N/A Level: 19.2 N/A N/A Debridement A (sq cm): rea Curette N/A N/A Instrument: Minimum N/A N/A Bleeding: Pressure N/A N/A Hemostasis A chieved: 0 N/A N/A Procedural Pain: 0 N/A N/A Post Procedural Pain: Procedure was tolerated well N/A N/A Debridement Treatment Response: 8x2.4x0.1 N/A N/A Post Debridement Measurements L x W x D (cm) 1.508 N/A N/A Post Debridement Volume: (cm) Scarring: Yes Callus: Yes N/A Periwound Skin Texture: Dry/Scaly: Yes Dry/Scaly: Yes N/A Periwound Skin Moisture: Maceration: No No Abnormalities Noted No Abnormalities Noted N/A Periwound Skin Color: No Abnormality No Abnormality N/A Temperature: Yes N/A N/A Tenderness on Palpation: Debridement T Contact Cast otal N/A Procedures Performed: Treatment Notes Electronic Signature(s) Signed: 04/27/2022 11:18:33 AM By: Fredirick Maudlin MD FACS Entered By: Fredirick Maudlin on 04/27/2022 11:18:33 -------------------------------------------------------------------------------- Multi-Disciplinary Care Plan Details Patient Name: Date of Service: [name]. 04/27/2022 10:30 A M Medical Record Number: [id number] Patient Account Number: [id number] Date of Birth/Sex: Treating RN: September 12, 1950 (71 y.o. [name] Primary Care Venancio Chenier: [name] Other Clinician: Referring Kuzey Ogata: Treating Langston Tuberville/Extender: [name] in Treatment: [address], [place] ([id number]) [id number] Page 4 of 9 Multidisciplinary Care Plan reviewed with physician Active Inactive Venous Leg Ulcer Nursing Diagnoses: Knowledge deficit related to disease process and management Potential for venous Insuffiency (use before diagnosis  confirmed) Goals: Patient will maintain optimal edema control Date Initiated: 07/27/2021 Target Resolution Date: 05/10/2022 Goal Status: Active Interventions: Assess peripheral edema status every visit. Treatment Activities: Therapeutic compression applied : 07/27/2021 Notes: Wound/Skin Impairment Nursing Diagnoses: Impaired tissue integrity Knowledge deficit related to ulceration/compromised skin integrity Goals: Patient will have a decrease in wound volume by X% from date: (specify in notes) Date Initiated: 04/12/2021 Date Inactivated: 01/04/2022 Target Resolution Date: 04/23/2021 Goal Status: Met Patient/caregiver will verbalize understanding of skin care regimen Date Initiated: 01/04/2022 Target Resolution Date: 05/25/2022 Goal Status: Active Ulcer/skin breakdown will have a volume reduction of 30% by week 4 Date Initiated: 04/12/2021 Date Inactivated: 04/27/2021 Target Resolution Date: 04/27/2021 Goal Status: Unmet Unmet Reason: infection Ulcer/skin breakdown will have a volume reduction of 50% by week 8 Date Initiated: 04/27/2021 Date Inactivated: 06/29/2021 Target Resolution Date: 06/24/2021 Goal Status: Met Interventions: Assess patient/caregiver ability to obtain necessary supplies Assess patient/caregiver ability to perform ulcer/skin care regimen upon admission and as needed Assess ulceration(s) every visit Notes: Electronic  Signature(s) Signed: 04/27/2022 5:27:34 PM By: Baruch Gouty RN, BSN Entered By: Baruch Gouty on 04/27/2022 11:05:06 -------------------------------------------------------------------------------- Pain Assessment Details Patient Name: Date of Service: [name]. 04/27/2022 10:30 A M Medical Record Number: [id number] Patient Account Number: [id number] Date of Birth/Sex: Treating RN: 27-May-1951 (71 y.o. M) Primary Care Pennye Beeghly: [name] Other Clinician: Referring Shelton Square: Treating Lovie Agresta/Extender: [name] in Treatment: [address] [place], BADE ([id number]) [id number] Page 5 of 9 Location of Pain Severity and Description of Pain Patient Has Paino Yes Site Locations Pain Location: Pain in Ulcers With Dressing Change: No Duration of the Pain. Constant / Intermittento Intermittent Rate the pain. Current Pain Level: 0 Worst Pain Level: 5 Least Pain Level: 0 Tolerable Pain Level: 2 Character of Pain Describe the Pain: Aching, Tender Pain Management and Medication Current Pain Management: Medication: Yes Is the Current Pain Management Adequate: Adequate How does your wound impact your activities of daily livingo Sleep: No Bathing: No Appetite: No Relationship With Others: No Bladder Continence: No Emotions: No Bowel Continence: No Work: No Toileting: No Drive: No Dressing: No Hobbies: No Engineer, maintenance) Signed: 04/27/2022 5:27:34 PM By: Baruch Gouty RN, BSN Entered By: Baruch Gouty on 04/27/2022 10:54:46 -------------------------------------------------------------------------------- Patient/Caregiver Education Details Patient Name: Date of Service: [name] 11/3/2023andnbsp10:30 A M Medical Record Number: [id number] Patient Account Number: [id number] Date of Birth/Gender: Treating RN: October 15, 1950 (71 y.o. [name] Primary Care Physician: [name] Other Clinician: Referring Physician: Treating Physician/Extender: [name] in Treatment: 39 Education Assessment Education Provided To: Patient Education Topics Provided Offloading: Methods: Explain/Verbal Responses: Reinforcements needed, State content correctly Wound/Skin Impairment: Methods: Explain/Verbal Responses: Reinforcements needed, State content correctly [name], [name] ([id number]) [id number] Page 6 of 9 Electronic Signature(s) Signed: 04/27/2022 5:27:34 PM By: Baruch Gouty  RN, BSN Entered By: Baruch Gouty on 04/27/2022 11:05:29 -------------------------------------------------------------------------------- Wound Assessment Details Patient Name: Date of Service: [name]. 04/27/2022 10:30 A M Medical Record Number: [id number] Patient Account Number: [id number] Date of Birth/Sex: Treating RN: 06-21-1951 (71 y.o. [name] Primary Care Lovell Nuttall: [name] Other Clinician: Referring Newell Wafer: Treating Manna Gose/Extender: [name] in Treatment: 54 Wound Status Wound Number: 22 Primary Cyst Etiology: Wound Location: Left, Lateral Lower Leg Wound Open Wounding Event: Bump Status: Date Acquired: 06/03/2021 Comorbid [name], Sleep Apnea, Hypertension, Peripheral Arterial Disease, Weeks Of Treatment: 46 History: Peripheral Venous Disease, Type II Diabetes, Gout, Osteoarthritis, Clustered Wound: Yes Neuropathy Photos Wound Measurements Length: (cm) Width: (cm) Depth: (cm) Clustered Quantity: Area: (cm) Volume: (cm) 8 % Reduction in Area: -814.5% 2.4 % Reduction in Volume: -14.3% 0.1 Epithelialization: Small (1-33%) 3 Tunneling: No 15.08 Undermining: No 1.508 Wound Description Classification: Full Thickness With Exposed Support Structures Wound Margin: Fibrotic scar, thickened scar Exudate Amount: Medium Exudate Type: Serosanguineous Exudate Color: red, brown Foul Odor After Cleansing: No Slough/Fibrino No Wound Bed Granulation Amount: Large (67-100%) Exposed Structure Granulation Quality: Red Fascia Exposed: No Necrotic Amount: None Present (0%) Fat Layer (Subcutaneous Tissue) Exposed: Yes Tendon Exposed: No Muscle Exposed: No Joint Exposed: No Bone Exposed: No Periwound Skin Texture Texture Color No Abnormalities Noted: No No Abnormalities Noted: Yes Scarring: Yes Temperature / Pain Temperature: No Abnormality Moisture No Abnormalities Noted: No Tenderness on Palpation: [name], [name] ([id number]) [id number] Page 7 of 9 Dry / Scaly: Yes Treatment Notes Wound #22 (Lower Leg) Wound Laterality: Left, Lateral Cleanser Soap and Water Discharge Instruction: May shower and wash wound with dial antibacterial  soap and water prior to dressing change. Wound Cleanser Discharge Instruction: Cleanse the wound with wound cleanser prior to applying a clean dressing using gauze sponges, not tissue or cotton balls. Peri-Wound Care Sween Lotion (Moisturizing lotion) Discharge Instruction: Apply moisturizing lotion to the leg Topical Skintegrity Hydrogel 4 (oz) Discharge Instruction: Apply hydrogel as directed Primary Dressing Promogran Prisma Matrix, 4.34 (sq in) (silver collagen) Discharge Instruction: Moisten collagen with saline or hydrogel Secondary Dressing ABD Pad, 8x10 Discharge Instruction: Apply over primary dressing as directed. Secured With Elastic Bandage 4 inch (ACE bandage) Discharge Instruction: Secure with ACE bandage as directed. Compression Wrap Compression Stockings Add-Ons Electronic Signature(s) Signed: 04/27/2022 12:02:27 PM By: Worthy Rancher Signed: 04/27/2022 5:27:34 PM By: Baruch Gouty RN, BSN Entered By: Worthy Rancher on 04/27/2022 11:06:27 -------------------------------------------------------------------------------- Wound Assessment Details Patient Name: Date of Service: [name]. 04/27/2022 10:30 A M Medical Record Number: [id number] Patient Account Number: [id number] Date of Birth/Sex: Treating RN: Jan 10, 1951 (71 y.o. [name] Primary Care Geneive Sandstrom: [name] Other Clinician: Referring Avrielle Fry: Treating Seanpatrick Maisano/Extender: [name] in Treatment: 54 Wound Status Wound Number: 27 Primary Diabetic Wound/Ulcer of the Lower Extremity Etiology: Wound Location: Left, Medial Foot Wound Open Wounding Event: Blister Status: Date Acquired: 02/01/2022 Comorbid [name],  Sleep Apnea, Hypertension, Peripheral Arterial Disease, Weeks Of Treatment: 12 History: Peripheral Venous Disease, Type II Diabetes, Gout, Osteoarthritis, Clustered Wound: No Neuropathy Photos [name], [name] ([id number]) [id number] Page 8 of 9 Wound Measurements Length: (cm) 0.1 Width: (cm) 0.1 Depth: (cm) 0.1 Area: (cm) 0.008 Volume: (cm) 0.001 % Reduction in Area: 99.2% % Reduction in Volume: 99% Epithelialization: Large (67-100%) Tunneling: No Undermining: No Wound Description Classification: Grade 1 Wound Margin: Distinct, outline attached Exudate Amount: Small Exudate Type: Serous Exudate Color: amber Foul Odor After Cleansing: No Slough/Fibrino No Wound Bed Granulation Amount: None Present (0%) Exposed Structure Necrotic Amount: None Present (0%) Fascia Exposed: No Fat Layer (Subcutaneous Tissue) Exposed: No Tendon Exposed: No Muscle Exposed: No Joint Exposed: No Bone Exposed: No Periwound Skin Texture Texture Color No Abnormalities Noted: No No Abnormalities Noted: Yes Callus: Yes Temperature / Pain Temperature: No Abnormality Moisture No Abnormalities Noted: No Dry / Scaly: Yes Maceration: No Treatment Notes Wound #27 (Foot) Wound Laterality: Left, Medial Cleanser Peri-Wound Care Sween Lotion (Moisturizing lotion) Discharge Instruction: Apply moisturizing lotion as directed Topical Primary Dressing Hydrofera Blue Classic Foam, 4x4 in Discharge Instruction: Moisten with saline prior to applying to wound bed Secondary Dressing Optifoam Non-Adhesive Dressing, 4x4 in Discharge Instruction: Apply over primary dressing cut to make foam donut Woven Gauze Sponges 2x2 in Discharge Instruction: Apply over primary dressing as directed. Secured With 82M Medipore H Soft Cloth Surgical T ape, 4 x 10 (in/yd) Discharge Instruction: Secure with tape as directed. Compression Wrap Compression Stockings [name], [name] ([id number])  [id number] Page 9 of [place], [place] Discharge Instruction: SIZE 3 TCC Electronic Signature(s) Signed: 04/27/2022 12:02:27 PM By: Worthy Rancher Signed: 04/27/2022 5:27:34 PM By: Baruch Gouty RN, BSN Entered By: Worthy Rancher on 04/27/2022 11:05:55 -------------------------------------------------------------------------------- Vitals Details Patient Name: Date of Service: [name]. 04/27/2022 10:30 A M Medical Record Number: [id number] Patient Account Number: [id number] Date of Birth/Sex: Treating RN: 1951/02/13 (71 y.o. M) Primary Care Lyanna Blystone: [name] Other Clinician: Referring Kenedie Dirocco: Treating Havannah Streat/Extender: [name] in Treatment: 43 Vital Signs Time Taken: 10:30 Temperature (F): 97.9 Height (in): 74 Pulse (bpm): 58 Weight (lbs): 238 Respiratory Rate (breaths/min): 20 Body Mass Index (BMI): 30.6  Blood Pressure (mmHg): 159/84 Capillary Blood Glucose (mg/dl): 184 Reference Range: 80 - 120 mg / dl Notes glucose per pt report this am Electronic Signature(s) Signed: 04/27/2022 5:27:34 PM By: Baruch Gouty RN, BSN Entered By: Baruch Gouty on 04/27/2022 10:43:13

## 2022-04-28 NOTE — Progress Notes (Signed)
[name] ([id number]) [id number] Page 1 of 18 Visit Report for 04/27/2022 Chief Complaint Document Details Patient Name: Date of Service: [name], [name] 04/27/2022 10:30 A M Medical Record Number: [id number] Patient Account Number: [id number] Date of Birth/Sex: Treating RN: January 09, 1951 (71 y.o. M) Primary Care Provider: Jilda [name] Other Clinician: Referring Provider: Treating Provider/Extender: [name] in Treatment: 41 Information Obtained from: Patient Chief Complaint Left leg and foot ulcers 04/12/2021; patient is here for wounds on his left lower leg and left plantar foot over the first metatarsal head Electronic Signature(s) Signed: 04/27/2022 11:19:03 AM By: [name] Maudlin MD FACS Entered By: [name] on 04/27/2022 11:19:03 -------------------------------------------------------------------------------- Debridement Details Patient Name: Date of Service: [name]. 04/27/2022 10:30 A M Medical Record Number: [id number] Patient Account Number: [id number] Date of Birth/Sex: Treating RN: [birth year] (71 y.o. [name], [name] Primary Care Provider: Jilda [name] Other Clinician: Referring Provider: Treating Provider/Extender: [name] in Treatment: 54 Debridement Performed for Assessment: Wound #22 Left,Lateral Lower Leg Performed By: Physician [name] Maudlin, MD Debridement Type: Debridement Level of Consciousness (Pre-procedure): Awake and Alert Pre-procedure Verification/Time Out Yes - 11:10 Taken: Start Time: 11:11 T Area Debrided (L x W): otal 8 (cm) x 2.4 (cm) = 19.2 (cm) Tissue and other material debrided: Non-Viable, Slough, Biofilm, Slough Level: Non-Viable Tissue Debridement Description: Selective/Open Wound Instrument: Curette Bleeding: Minimum Hemostasis Achieved: Pressure Procedural Pain: 0 Post Procedural Pain: 0 Response to Treatment: Procedure was  tolerated well Level of Consciousness (Post- Awake and Alert procedure): Post Debridement Measurements of Total Wound Length: (cm) 8 Width: (cm) 2.4 Depth: (cm) 0.1 Volume: (cm) 1.508 Character of Wound/Ulcer Post Debridement: Improved Post Procedure Diagnosis Same as [name] ([id number]) [id number] Page 2 of 18 Notes scribed by [name] Gouty, RN for Dr. Celine Ahr Electronic Signature(s) Signed: 04/27/2022 11:57:30 AM By: [name] Maudlin MD FACS Signed: 04/27/2022 5:27:34 PM By: [name] Gouty RN, BSN Entered By: [name] on 04/27/2022 11:14:16 -------------------------------------------------------------------------------- HPI Details Patient Name: Date of Service: [name]. 04/27/2022 10:30 A M Medical Record Number: [id number] Patient Account Number: [id number] Date of Birth/Sex: Treating RN: 12/28/1950 (71 y.o. M) Primary Care Provider: Jilda [name] Other Clinician: Referring Provider: Treating Provider/Extender: [name] in Treatment: 76 History of Present Illness HPI Description: 10/11/17; [name] is a 71 year old man who tells me that in 2015 he slipped down the latter traumatizing his left leg. He developed a wound in the same spot the area that we are currently looking at. He states this closed over for the most part although he always felt it was somewhat unstable. In 2016 he hit the same area with the door of his car had this reopened. He tells me that this is never really closed although sometimes an inflow it remains open on a constant basis. He has not been using any specific dressing to this except for topical antibiotics the nature of which were not really sure. His primary doctor did send him to see Dr. [name] of interventional cardiology. He underwent an angiogram on 08/06/17 and he underwent a PTA and directional atherectomy of the lesser distal SFA and popliteal arteries which  resulted in brisk improvement in blood flow. It was noted that he had 2 vessel runoff through the anterior tibial and peroneal. He is also been to see vascular and interventional radiologist. He was not felt to have any significant superficial venous insufficiency. Presumably is not a candidate for any ablation. It  was suggested he come here for wound care. The patient is a type II diabetic on insulin. He also has a history of venous insufficiency. ABIs on the left were noncompressible in our clinic 10/21/17; patient we admitted to the clinic last week. 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 We put Iodosorb on him after an aggressive debridement and 3 layer compression. He complained of pain in his ankle and itching with is skin in fact he scratched the area on the medial calf superiorly at the rim of our wraps and he has 2 small open areas in that location today which are new. I changed his primary dressing today to silver collagen. As noted he is already had revascularization and does not have any significant superficial venous insufficiency that would be amenable to ablation 10/28/17; patient admitted to the clinic 2 weeks ago. He has a smaller Wound. Scratch injury from last week revealed. There is large wound over the tibial area. This is smaller. Granulation looks healthy. No need for debridement. 11/04/17; the wound on the left lateral calf looks better. Improved dimensions. Surface of this looks better. We've been maintaining him and Kerlix Coban wraps. He finds this much more comfortable. Silver collagen dressing 11/11/17; left lateral Wound continues to look healthy be making progress. Using a #5 curet I removed removed nonviable skin from the surface of the wound and then necrotic debris from the wound surface. Surface of the wound continues to look healthy. He also has an open area on the left great toenail bed. We've been  using topical antibiotics. 11/19/17; left anterior lateral wound continues to look healthy but it's not closed. He also had a small wound above this on the left leg Initially traumatic wounds in the setting of significant chronic venous insufficiency and stasis dermatitis 11/25/17; left anterior wounds superiorly is closed still a small wound inferiorly. 12/02/17; left anterior tibial area. Arrives today with adherent callus. Post debridement clearly not completely closed. Hydrofera Blue under 3 layer compression. 12/09/17; left anterior tibia. Circumferential eschar however the wound bed looks stable to improved. We've been using Hydrofera Blue under 3 layer compression 12/17/17; left anterior tibia. Apparently this was felt to be closed however when the wrap was taken off there is a skin tear to reopen wounds in the same area we've been using Hydrofera Blue under 3 layer compression 12/23/17 left anterior tibia. Not close to close this week apparently the [facility] was stuck to this again. Still circumferential eschar requiring debridement. I put a contact layer on this this time under the Hydrofera Blue 12/31/17; left anterior tibia. Wound is better slight amount of hyper-granulation. Using Hydrofera Blue over Adaptic. 01/07/18; left anterior tibia. The wound had some surface eschar however after this was removed he has no open wound.he was already revascularized by Dr. [name] when he came to our clinic with atherectomy of the left SFA and popliteal artery. He was also sent to interventional radiology for venous reflux studies. He was not felt to have significant reflux but certainly has chronic venous changes of his skin with hemosiderin deposition around this area. He will definitely need to lubricate his skin and wear compression stocking and I've talked to him about this. READMISSION 05/26/2018 This is a now 71 year old man we cared for with traumatic wounds on his left anterior lower extremity. He  had been previously revascularized during that admission by Dr. [name]. Apparently in follow-up Dr. [name]  noted that he had deterioration in his arterial status. He underwent a stent placement in the distal left SFA on 04/22/2018. 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 He went back to the angiography suite on 04/30/2018 he underwent PTA and balloon angioplasty of the occluded left mid anterior tibial artery, thrombotic occlusion went from 100 to 0% which reconstitutes the posterior tibial artery. He had thrombectomy and aspiration of the peroneal artery. The stent placed in the distal SFA left SFA was still occluded. 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 He has been recuperating at [place] home in [facility] after the most recent hospitalization. He is going home tomorrow. He tells me that 2 weeks ago he traumatized the tip of his left fifth toe. He came in urgently for our review of this. This was a history of before I noted that Dr. [name] had already noted KHADIR, ROAM ([id number]) [id number] Page 3 of 18 dry gangrenous changes of the left fifth toe 06/09/2018; 2-week follow-up. I did contact Dr. [name] after his last appointment and he apparently saw 1 of Dr. Irven Shelling colleagues the next day. He does not follow-up with Dr. [name] himself until Thursday of this week. 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 Nevertheless there is no evidence of infection no drainage and no pain. He had a new area that this week when we were signing him in today on the left anterior mid tibia area, this is in close proximity to the previous wound we have dealt with in this clinic. 06/23/2018;  2-week follow-up. I did not receive a recent note from Dr. [name] to review today. Our office is trying to obtain this. He is apparently not planning to do further vascular interventions and wondered about compression to try and help with the patient's chronic venous insufficiency. However we are also concerned about the arterial flow. He arrives in clinic today with a new area on the left third toe. The areas on the calf/anterior tibia are close to closing. The left fifth toe is still mummified using Betadine. -In reviewing things with the patient he has what sounds like claudication with mild to moderate amount of activity. 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 I prescribed Levaquin over the phone while we attempted to arrange a plan of care. However the patient called yesterday to report he had low-grade fever and he came in today acutely. There is been a marked deterioration in the left third toe with spreading cellulitis up into the dorsal left foot. He was referred to the emergency room. Readmission: 06/29/2020 patient presents today for reevaluation here in our clinic he was previously treated by Dr. Dellia Nims at the latter part of 2019 in 2 the beginning of 2020. Subsequently we have not seen him since that time in the interim he did have evaluation with vein and vascular specialist specifically Dr. Anice Paganini who did perform quite extensive work for a left femoral to anterior tibial artery bypass. With that being said in the interim the patient has developed significant lymphedema and has wounds that he tells me have really never healed in regard to the incision site on the left leg. He  also has multiple wounds on the feet for various reasons some of which is that he tends to pick at his feet. Fortunately there is no signs of active infection systemically at this time he does have some wounds that are little bit deeper but most are fairly superficial he seems  to have good blood flow and overall everything appears to be healthy I see no bone exposed and no obvious signs of osteomyelitis. I do not know that he necessarily needs a x-ray at this point although that something we could consider depending on how things progress. The patient does have a history of lymphedema, diabetes, this is type II, chronic kidney disease stage III, hypertension, and history of peripheral vascular disease. 07/05/2020; patient admitted last week. Is a patient I remember from 2019 he had a spreading infection involving the left foot and we sent him to the hospital. 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 He subsequently had a left femoral to anterior tibial bypass by Dr.Cain vein and vascular. He also has severe lymphedema with chronic skin changes related to that on the left leg. The most problematic area that was new today was on the left medial great toe. This was apparently a small area last week there was purulent drainage which our intake nurse cultured. Also areas on the left medial foot and heel left lateral foot. He has 2 areas on the left medial calf left lateral calf in the setting of the severe lymphedema. 07/13/2020 on evaluation today patient appears to be doing better in my opinion compared to his last visit. The good news is there is no signs of active infection systemically and locally I do not see any signs of infection either. He did have an x-ray which was negative that is great news he had a culture which showed MRSA but at the same time he is been on the doxycycline which has helped. I do think we may want to extend this for 7 additional days 1/25; patient admitted to the clinic a few weeks ago. 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 We have been putting him under compression his edema control is a lot better but he is severe verricused skin on the left leg. He is really  done quite well he still has an open area on the left medial calf and the left medial first metatarsal head. We have been using silver collagen on the leg silver alginate on the foot 07/27/2020 upon evaluation today patient appears to be doing decently well in regard to his wounds. He still has a lot of dry skin on the left leg. Some of this is starting to peel back and I think he may be able to have them out by removing some that today. Fortunately there is no signs of active infection at this time on the left leg although on the right leg he does appear to have swelling and erythema as well as some mild warmth to touch. This does have been concerned about the possibility of cellulitis although within the differential diagnosis I do think that potentially a DVT has to be at least considered. We need to rule that out before proceeding would just call in the cellulitis. Especially since he is having pain in the posterior aspect of his calf muscle. 2/8; the patient had seen sparingly. He has severe skin changes of chronic lymphedema in the left leg thickened hyperkeratotic  verrucous skin. He has an open wound on the medial part of the left first met head left mid tibia. He also has a rim of nonepithelialized skin in the anterior mid tibia. He brought in the AmLactin lotion that was been prescribed although I am not sure under compression and its utility. There concern about cellulitis on the right lower leg the last time he was here. He was put on on antibiotics. His DVT rule out was negative. The right leg looks fine he is using his stocking on this area 08/10/2020 upon evaluation today patient appears to be doing well with regard to his leg currently. He has been tolerating the dressing changes without complication. Fortunately there is no signs of active infection which is great news. Overall very pleased with where things stand. 2/22; the patient still has an area on the medial part of the left first  met his head. This looks better than when I last saw this earlier this month he has a rim of epithelialization but still some surface debris. Mostly everything on the left leg is healed. There is still a vulnerable in the left mid tibia area. 08/30/2020 upon evaluation today patient appears to be doing much better in regard to his wounds on his foot. 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 Nonetheless I think we will address that today I Minna send in a prescription for him in that regard. Overall though there does not appear to be any signs of significant worsening. 09/07/2020 on evaluation today patient's wounds over his left foot appear to be doing excellent. I do not see any signs of infection there is some callus buildup this can require debridement for certain but overall I feel like he is managing quite nicely. He still using the AmLactin cream which has been beneficial for him as well. 3/22; left foot wound is closed. There is no open area here. He is using ammonium lactate lotion to the lower extremities to help exfoliate dry cracked skin. He has compression stockings from elastic therapy in [place]. The wound on the medial part of his left first met head is healed today. READMISSION 04/12/2021 Mr. Lolli is a patient we know fairly well he had a prolonged stay in clinic in 2019 with wounds on his left lateral and left anterior lower extremity in the setting of chronic venous insufficiency. More recently he was here earlier this year with predominantly an area on his left foot first metatarsal head plantar and he says the plantar foot broke down on its not long after we discharged him but he did not come back here. The last few months areas of broken down on his left anterior and again the left lateral lower extremity. The leg itself is very swollen  chronically enlarged a lot of hyperkeratotic dry Berry Q skin in the left lower leg. His edema extends well into the thigh. He was seen by Dr. Donzetta Matters. He had ABIs on 03/02/2021 showing an ABI on the right of 1 with a TBI of 0.72 his ABI in the left at 1.09 TBI of 0.99. Monophasic and biphasic waveforms on the right. On the left monophasic waveforms were noted he went on to have an angiogram on 03/27/2021 this showed the aortic aortic and iliac segments were free of flow-limiting stenosis the left common femoral vein to evaluate the left femoral to  anterior tibial artery bypass was unobstructed the bypass was patent without any areas of stenosis. We discharged the patient in bilateral juxta lite stockings but very clearly that was not sufficient to control the swelling and maintain skin integrity. He is clearly going to need compression pumps. 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 This is fortunate because he is on his feet for long periods of time. 10/27; patient comes in with our intake nurse reporting copious amount of green drainage from the left anterior mid tibia the left dorsal foot and to a lesser extent the left medial mid tibia. We left the compression wrap on all week for the amount of edema in his left leg is quite a bit better. We use silver alginate as the primary dressing 11/3; edema control is good. Left anterior lower leg left medial lower leg and the plantar first metatarsal head. The left anterior lower leg required debridement. [name], [name] ([id number]) [id number] Page 4 of 18 Deep tissue culture I did of this wound showed MRSA I put him on 10 days of doxycycline which she will start today. We have him in compression wraps. He has a security card and [employer] however he is retiring on November 15. We will need to then get him into a better offloading boot for the left foot perhaps a total contact  cast 11/10; edema control is quite good. Left anterior and left medial lower leg wounds in the setting of chronic venous insufficiency and lymphedema. He also has a substantial area over the left plantar first metatarsal head. 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 He has significant hypergranulation on the left plantar foot wound. The patient is a diabetic but he does not have significant PAD 11/17; edema control is quite good. Left anterior and left medial lower leg wounds look better. The really concerning area remains the area on the left plantar first metatarsal head. He has a rim of epithelialization. 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 Starting today with a forefoot off loader but . possibly a total contact cast. 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 Arrives with a very significant odor from I think all of the wounds on the left leg and the left foot. Because of this I did not put a total contact cast on him today but will could still consider this. His wife was having cataract surgery which is the reason he missed the appointment 12/6. I saw this man 5 days ago with a swelling below the popliteal fossa. I thought he actually might have a Baker's cyst however the DVT rule out study that we could arrange right away was negative the technician told me this was not a ruptured Baker's cyst. We attempted to get this aspirated by under ultrasound guidance in interventional radiology however all they did was an ultrasound however it shows an extensive fluid collection 62 x 8 x 9.4 in the left thigh and left calf. The patient states he thinks this started 8 days ago or so but  he really is not  complaining of any pain, fever or systemic symptoms. He has not ha 12/20; after some difficulty I managed to get the patient into see Dr. Donzetta Matters. Eventually he was taken into the hospital and had a drain put in the fluid collection below his left knee posteriorly extending into the posterior thigh. He still has the drain in place. Culture of this showed moderate staff aureus few Morganella and few Klebsiella he is now on doxycycline and ciprofloxacin as suggested by infectious disease he is on this for a month. The drain will remain in place until it stops draining 12/29; he comes in today with the 1 wound on his left leg and the area on the left plantar first met head significantly smaller. Both look healthy. He still has the drain in the left leg. He says he has to change this daily. Follows up with Dr. Donzetta Matters on January 11. 06/29/2021; the wounds that I am following on the left leg and left first met head continued to be quite healthy. However the area where his inferior drain is in place had copious amounts of drainage which was green in color. The wound here is larger. Follows up with Dr. Gwenlyn Saran of vein and vascular his surgeon next week as well as infectious disease. He remains on ciprofloxacin and doxycycline. He is not complaining of excessive pain in either one of the drain areas 1/12; the patient saw vascular surgery and infectious disease. Vascular surgery has left the drain in place as there was still some notable drainage still see him back in 2 weeks. Dr. Velna Ochs stop the doxycycline and ciprofloxacin and I do not believe he follows up with them at this point. Culture I did last week showed both doxycycline resistant MRSA and Pseudomonas not sensitive to ciprofloxacin although only in rare titers 1/19; the patient's wound on the left anterior lower leg is just about healed. We have continued healing of the area that was medially on the left leg. Left first plantar metatarsal head  continues to get smaller. The major problem here is his 2 drain sites 1 on the left upper calf and lateral thigh. There is purulent drainage still from the left lateral thigh. I gave him antibiotics last week but we still have recultured. He has the drain in the area I think this is eventually going to have to come out. I suspect there will be a connecting wound to heal here perhaps with improved VAc 1/26; the patient had his drain removed by vein and vascular on 1/25/. This was a large pocket of fluid in his left thigh that seem to tunnel into his left upper calf. 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 His mention his Penrose drain was removed today. He now has a tunneling wound on his left calf and left thigh. Both of these probe widely towards each other although I cannot really prove that they connect. Both wounds on his lower leg anteriorly are closed and his area over the first metatarsal head on his right foot continues to improve. We are using Hydrofera Blue here. He also saw infectious disease culture of the abscess they noted was polymicrobial with MRSA, Morganella and Klebsiella he was treated with doxycycline and ciprofloxacin for 4 weeks ending on 07/03/2021. They did not recommend any further antibiotics. Notable that while he still had the Penrose drain in place last week he had purulent drainage coming out of the inferior IandD site this grew [facility], MRSA and Pseudomonas but  there does not appear to be any active infection in this area today with the drain out and he is not systemically unwell 2/2; with regards to the drain sites the superior one on the thigh actually is closed down the one on the upper left lateral calf measures about 8 and half centimeters which is an improvement seems to be less prominent although still with a lot of drainage. The only remaining wound is over the first metatarsal head on the left foot and this looks to be continuing to  improve with Hydrofera Blue. 2/9; the area on his plantar left foot continues to contract. Callus around the wound edge. The drain sites specifically have not come down in depth. We put the wound VAC on Monday he changed the canister late last night our intake nurse reported a pocket of fluid perhaps caused by our compression wraps 2/16; continued improvement in left foot plantar wound. drainage site in the calf is not improved in terms of depth (wound vac) 2/23; continued improvement in the left foot wound over the first metatarsal head. With regards to the drain sites the area on his thigh laterally is healed however the open area on his calf is small in terms of circumference by still probes in by about 15 cm. Within using the wound VAC. Hydrofera Blue on his foot 08/24/2021: The left first metatarsal head wound continues to improve. The wound bed is healthy with just some surrounding callus. Unfortunately the open drain site on his calf remains open and tunnels at least 15 cm (the extent of a Q-tip). This is despite several weeks of wound VAC treatment. Based on reading back through the notes, there has been really no significant change in the depth of the wound, although the orifice is smaller and the more cranial wound on his thigh has closed. I suspect the tunnel tracks nearly all the way to this location. 08/31/2021: Continued improvement in the left first metatarsal head wound. There has been absolutely no improvement to the long tunnel from his open drain site on his calf. We have tried to get him into see vascular surgery sooner to consider the possibility of simply filleting the tract open and allowing it to heal from the bottom up, likely with a wound VAC. They have not yet scheduled a sooner appointment than his current mid April 09/14/2021: He was seen by vascular surgery and they took him to the operating room last week. They opened a portion of the tunnel, but did not extend the entire  length of the known open subcutaneous tract. I read Dr. Claretha Cooper operative note and it is not clear from that documentation why only a portion of the tract was opened. The heaped up granulation tissue was curetted and removed from at least some portion of the tract. They did place a wound VAC and applied an Unna boot to the leg. The ulcer on his left first metatarsal head is smaller today. The bed looks good and there is just a small amount of surrounding callus. 09/21/2021: The ulcer on his left first metatarsal head looks to be stalled. There is some callus surrounding the wound but the wound bed itself does not appear particularly dynamic. The tunnel tract on his lateral left leg seems to be roughly the same length or perhaps slightly smaller but the wound bed appears healthy with good granulation tissue. He opened up a new wound on his medial thigh and the site of a prior surgical incision. He says that  he did this unconsciously in his sleep by scratching. 09/28/2021: Unfortunately, the ulcer on his left first metatarsal head has extended underneath the callus toward the dorsum of his foot. The medial thigh wounds are roughly the same. The tunnel on his lateral left leg continues to be problematic; it is longer than we are able to actually probe with a Q-tip. I am still not certain as to why Dr. Donzetta Matters did not open this up entirely when he took the patient to the operating room. We will likely be back in the same situation with just a small superficial opening in a long unhealed tract, as the open portion is granulating in nicely. [name], [name] ([id number]) [id number] Page 5 of 18 10/02/2021: The patient was initially scheduled for a nurse visit, but we are also applying a total contact cast today. The plantar foot wound looks clean without significant accumulated callus. We have been applying Prisma silver collagen to the site. 10/05/2021: The patient is here for his first  total contact cast change. We have tried using gauze packing strips in the tunnel on his lateral leg wound, but this does not seem to be working any better than the white VAC foam. The foot ulcer looks about the same with minimal periwound callus. Medial thigh wound is clean with just some overlying eschar. 10/12/2021: The plantar foot wound is stable without any significant accumulation of periwound callus. The surface is viable with good granulation tissue. The medial thigh wounds are much smaller and are epithelializing. On the other hand, he had purulent drainage coming from the tunnel on his lateral leg. He does go back to see Dr. Donzetta Matters next week and is planning to ask him why the wound tunnel was not completely opened at the time of his most recent operation. 10/19/2021: The plantar foot wound is markedly improved and has epithelial tissue coming through the surface. The medial thigh wounds are nearly closed with just a tiny open area. He did see Dr. Donzetta Matters earlier this week and apparently they did discuss the possibility of opening the sinus tract further and enabling a wound VAC application. Apparently there are some limits as to what Dr. 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 I think if we could get the tract open to the level of the popliteal fossa, this would greatly aid in her ability to get this chart closed. That being said, however, today when I probed the tract with a Q-tip, I was not able to insert the entirety of the Q-tip as I have on previous occasions. The tunnel is shorter by about 4 cm. The surface is clean with good granulation tissue and no further episodes of purulent drainage. 10/30/2021: Last week, the patient underwent surgery and had the long tract in his leg opened. There was a rind that was debrided, according to the operative report. His medial thigh ulcers are closed. The plantar foot wound is clean with a good surface and some built up  surrounding callus. 11/06/2021: The overall dimensions of the large wound on his lateral leg remain about the same, but there is good granulation tissue present and the tunneling is a little bit shorter. He has a new wound on his anterior tibial surface, in the same location where he had a similar lesion in the past. The plantar foot wound is clean with some buildup surrounding callus. Just toward the medial aspect of his foot, however, there is an area of darkening that once debrided, revealed  another opening in the skin surface. 11/13/2021: The anterior tibial surface wound is closed. The plantar foot wound has some surrounding callus buildup. The area of darkening that I debrided last week and revealed an opening in the skin surface has closed again. The tunnel in the large wound on his lateral leg has come in by about 3 cm. There is healthy granulation tissue on the entire wound surface. 11/23/2021: The patient was out of town last week and did wet-to-dry dressings on his large wound. 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 Unfortunately, he picked open the wound on his left medial thigh. He says that it was itching and he just could not stop scratching it until it was open again. The wound on his plantar foot is smaller and has not accumulated a tremendous amount of callus. The lateral leg wound is shallower and the tunnel has also decreased in depth. There is just a little bit of slough accumulation on the surface. 11/30/2021: Another portion of his left medial thigh has been opened up. All of these wounds are fairly superficial with just a little bit of slough and eschar accumulation. The wound on his plantar foot is almost closed with just a bit of eschar and periwound callus accumulation. The lateral leg wound is nearly flush with the surrounding skin and the tunnel is markedly shallower. 12/07/2021: There is just 1 open area on  his left medial thigh. It is clean with just a little bit of perimeter eschar. The wound on his plantar foot continues to contract and just has some eschar and periwound callus accumulation. The lateral leg wound is closing at the more distal aspect and the tunnel is smaller. The surface is nearly flush with the surrounding skin and it has a good bed of granulation tissue. 12/14/2021: The thigh and foot wounds are closed. The lateral leg wound has closed over approximately half of its length. The tunnel continues to contract and the surface is now flush with the surrounding skin. The wound bed has robust granulation tissue. 12/22/2021: The thigh and foot wounds have reopened. The foot wound has a lot of callus accumulation around and over it. The thigh wound is tiny with just a little bit of slough in the wound bed. The lateral leg wound continues to contract. His vascular surgeon took the wound VAC off earlier in the week and the patient has been doing wet-to-dry dressings. There is a little slough accumulation on the surface. The tunnel is about 3 cm in depth at this point. 12/28/2021: The thigh wound is closed again. The foot wound has some callus that subsequently has peeled back exposing just a small slit of a wound. The lateral leg wound Is down to about half the size that it originally was and the tunnel is down to about half a centimeter in depth. 01/04/2022: The thigh wound remains closed. The foot wound has heavy callus overlying the wound site. Once this was debrided, the wound was found to be closed. The lateral leg wound is smaller again this week and very superficial. No tunnel could be identified. 01/12/2022: The thigh and foot wounds both remain closed. The lateral leg wound is now nearly flush with the skin surface. There is good granulation tissue present with a light layer of slough. 01/19/2022: Due to the way his wrap was placed, the patient did not change the dressing on his thigh at all  and so  the foam was saturated and his skin is macerated. There is a light layer of slough on the wound surface. The underlying granulation tissue is robust and healthy-appearing. He has heavy callus buildup at the site of his first metatarsal head wound which is still healed. 02/01/2022: He has been in silver alginate. When he removed the dressing from his thigh wound, however, some leg, superficially reopening a portion of the wound that had healed. In addition, underneath the callus at his left first metatarsal head, there appears to be a blister and the wound appears to be open again. 02/08/2022: The lateral leg wound has contracted substantially. There is eschar and a light layer of slough present. He says that it is starting to pull and is uncomfortable. On inspection, there is some puckering of the scar and the eschar is quite dry; this may account for his symptoms. On his first metatarsal head, the wound is much smaller with just some eschar on the surface. The callus has not reaccumulated. He reports that he had a blister come up on his medial thigh wound at the distal aspect. It popped and there is now an opening in his skin again. Looking back through his [place] of wound photos, there is what looks like a permanent suture just deep to this location and it may be trying to erode through. We have been using silver alginate on his wounds. 02/15/2022: The lateral leg wound is about half the size it was last week. It is clean with just a little perimeter eschar and light slough. The wound on his first metatarsal head is about the same with heavy callus overlying it. The medial thigh wound is closed again. He does have some skin changes on the top of his foot that looks potentially yeast related. 02/22/2022: The skin on the top of his foot improved with the use of a topical antifungal. The lateral leg wound continues to contract and is again smaller this week. There is a little bit of slough and  eschar on the surface. The first metatarsal head wound is a little bit smaller but has reaccumulated a thick callus over the top. He decided to try to trim his toenail and ultimately took the entire nail off of his left great toe. 03/02/2022: His lateral leg wound continues to improve, as does the wound on his left great toe. Unfortunately, it appears that somehow his foot got wet and moisture seeped in through the opening causing his skin to lift. There is a large wound now overlying his first metatarsal on both the plantar, medial, and dorsal portion of his foot. There is necrotic tissue and slough present underneath the shaggy macerated skin. 03/08/2022: The lateral leg wound is smaller again today. There is just a light layer of slough and eschar on the surface. The great toe wound is smaller again today. The first metatarsal wound is a little bit smaller today and does not look nearly as necrotic and macerated. There is still slough and nonviable tissue present. 03/15/2022: The lateral leg wound is narrower and just has a little bit of light slough buildup. The first metatarsal wound still has a fair amount of moisture affecting the periwound skin. The great toe wound is healed. [name], [name] ([id number]) [id number] Page 6 of 18 03/22/2022: The lateral leg wound is now isolated to just at the level of his knee. There is some eschar and slough accumulation. The first metatarsal head wound has epithelialized tremendously and is about half the size  that it was last week. He still has some maceration on the top of his foot and a fungal odor is present. 03/29/2022: T oday the patient's foot was macerated, suggesting that the cast got wet. The patient has also been picking at his dry skin and has enlarged the wound on his left lateral leg. In the time between having his cast removed and my evaluation, he had picked more dry skin and opened up additional wounds on his Achilles  area and dorsal foot. The plantar first metatarsal head wound, however, is smaller and clean with just macerated callus around the perimeter and light slough on the surface. The lateral leg wound measured a little bit larger but is also fairly clean with eschar and minimal slough. 04/02/2022: The patient had vascular studies done last Friday and so his cast was not applied. He is here today to have that done. 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 04/05/2022: Both wounds are smaller and quite clean. There is just a little biofilm on the lateral leg wound. 10/20; the patient has a wound on the left lateral surgical incision at the level of his lateral knee this looks clean and improved. He is using silver alginate. He also has an area on his left medial foot for which she is using Hydrofera Blue under a total contact cast both wounds are measuring smaller 04/20/2022: The plantar foot wound has contracted considerably and is very close to closing. The lateral leg wound was measured a little larger, but there was a tiny open area that was included in the measurements that was not included last week. He has some eschar around the perimeter but otherwise the wound looks clean. 04/27/2022: The lateral leg wound looks better this week. He says that midweek, he felt it was very dry and began applying hydrogel to the site. I think this was beneficial. The foot wound is nearly closed underneath a thick layer of dry skin and callus. Electronic Signature(s) Signed: 04/27/2022 11:21:13 AM By: [name] Maudlin MD FACS Entered By: [name] on 04/27/2022 11:21:12 -------------------------------------------------------------------------------- Physical Exam Details Patient Name: Date of Service: [name]. 04/27/2022 10:30 A M Medical Record Number: [id number] Patient Account Number: [id number] Date of Birth/Sex: Treating RN: 24-Jun-1951 (71 y.o. M) Primary Care Provider: Jilda [name]  Other Clinician: Referring Provider: Treating Provider/Extender: [name] in Treatment: 55 Constitutional Hypertensive, asymptomatic. Slightly bradycardic, asymptomatic.. . . No acute distress.Marland Kitchen Respiratory Normal work of breathing on room air.. Notes 04/27/2022: The lateral leg wound looks better this week. He says that midweek, he felt it was very dry and began applying hydrogel to the site. I think this was beneficial. The foot wound is nearly closed underneath a thick layer of dry skin and callus. Electronic Signature(s) Signed: 04/27/2022 11:22:28 AM By: [name] Maudlin MD FACS Entered By: [name] on 04/27/2022 11:22:28 -------------------------------------------------------------------------------- Physician Orders Details Patient Name: Date of Service: [name]. 04/27/2022 10:30 A M Medical Record Number: [id number] Patient Account Number: [id number] Date of Birth/Sex: Treating RN: 05/05/1951 (71 y.o. 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 - Dr Celine Ahr - Room 1 TCC Return A Anesthetic Wound #22 Left,Lateral Lower Leg (In clinic) Topical Lidocaine 4% applied to wound bed Wound #27 Left,Medial Foot (In  clinic) Topical Lidocaine 4% applied to wound bed Bathing/ Shower/ Hygiene May shower with protection but do not get wound dressing(s) wet. - Use a cast protector so you can shower without getting your cast wet Edema Control - Lymphedema / SCD / Other Elevate legs to the level of the heart or above for 30 minutes daily and/or when sitting, a frequency of: - throughout the day Avoid standing for long periods of time. Patient to wear own compression stockings every day. - on right leg; Moisturize legs daily. - Ammonium lactate to right leg daily Compression stocking or Garment 20-30 mm/Hg pressure to: - left leg daily Off-Loading Total Contact Cast to Left Lower Extremity - TCC left leg Other: - minimal weight bearing left foot Wound Treatment Wound #22 - Lower Leg Wound Laterality: Left, Lateral Cleanser: Soap and Water 3 x Per Week/30 Days Discharge Instructions: May shower and wash wound with dial antibacterial soap and water prior to dressing change. Cleanser: Wound Cleanser 3 x Per Week/30 Days Discharge Instructions: Cleanse the wound with wound cleanser prior to applying a clean dressing using gauze sponges, not tissue or cotton balls. Peri-Wound Care: Sween Lotion (Moisturizing lotion) 3 x Per Week/30 Days Discharge Instructions: Apply moisturizing lotion to the leg Topical: Skintegrity Hydrogel 4 (oz) 3 x Per Week/30 Days Discharge Instructions: Apply hydrogel as directed Prim Dressing: Promogran Prisma Matrix, 4.34 (sq in) (silver collagen) 3 x Per Week/30 Days ary Discharge Instructions: Moisten collagen with saline or hydrogel Secondary Dressing: ABD Pad, 8x10 (Generic) 3 x Per Week/30 Days Discharge Instructions: Apply over primary dressing as directed. Secured With: Elastic Bandage 4 inch (ACE bandage) 3 x Per Week/30 Days Discharge Instructions: Secure with ACE bandage as directed. Wound #27 - Foot Wound Laterality: Left, Medial Peri-Wound Care: Sween Lotion (Moisturizing  lotion) 1 x Per Week Discharge Instructions: Apply moisturizing lotion as directed Prim Dressing: Hydrofera Blue Classic Foam, 4x4 in 1 x Per Week ary Discharge Instructions: Moisten with saline prior to applying to wound bed Secondary Dressing: Optifoam Non-Adhesive Dressing, 4x4 in 1 x Per Week Discharge Instructions: Apply over primary dressing cut to make foam donut Secondary Dressing: Woven Gauze Sponges 2x2 in 1 x Per Week Discharge Instructions: Apply over primary dressing as directed. Secured With: 48M Medipore H Soft Cloth Surgical T ape, 4 x 10 (in/yd) 1 x Per Week Discharge Instructions: Secure with tape as directed. Add-Ons: TCC-EZ Cast Boot, Charcot,Large 1 x Per Week Discharge Instructions: SIZE 3 TCC HARUTYUN, MONTEVERDE ([id number]) [id number] Page 8 of 18 Electronic Signature(s) Signed: 04/27/2022 11:57:30 AM By: [name] Maudlin MD FACS Entered By: [name] on 04/27/2022 11:22:39 -------------------------------------------------------------------------------- Problem List Details Patient Name: Date of Service: [name]. 04/27/2022 10:30 A M Medical Record Number: [id number] Patient Account Number: [id number] Date of Birth/Sex: Treating RN: 07-27-1950 (71 y.o. [name] Primary Care Provider: Jilda [name] Other Clinician: Referring Provider: Treating Provider/Extender: [name] in Treatment: 80 Active Problems ICD-10 Encounter Code Description Active Date MDM Diagnosis 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 04/12/2021 No Yes I89.0 Lymphedema, not elsewhere classified 04/12/2021 No Yes I87.322  Chronic venous hypertension (idiopathic) with inflammation of left lower 04/12/2021 No Yes extremity L97.828 Non-pressure chronic ulcer of other part of left lower leg with other specified 04/12/2021 No Yes severity L97.528 Non-pressure chronic ulcer of other  part of left foot with other specified 04/12/2021 No Yes severity Inactive Problems ICD-10 Code Description Active Date Inactive Date E11.621 Type 2 diabetes mellitus with foot ulcer 04/12/2021 04/12/2021 E11.42 Type 2 diabetes mellitus with diabetic polyneuropathy 04/12/2021 04/12/2021 L02.416 Cutaneous abscess of left lower limb 06/13/2021 06/13/2021 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 M) Primary Care Provider: Jilda [name] Other Clinician: Referring Provider: Treating Provider/Extender: [name] in Treatment: 23 Subjective Chief Complaint Information obtained from Patient Left leg and foot ulcers 04/12/2021; patient is here for wounds on his left lower leg and left plantar foot over the first metatarsal head History of Present Illness (HPI) 10/11/17; Mr. Seawood is a 71 year old man who tells me that in 2015 he slipped down the latter traumatizing his left leg. He developed a wound in the same spot the area that we are currently looking at. He states this closed over for the most part although he always felt it was somewhat unstable. In 2016 he hit the same area with the door of his car had this reopened. He tells me that this is never really closed although sometimes an inflow it remains open on a constant basis. He has not been using any specific dressing to this except for  topical antibiotics the nature of which were not really sure. His primary doctor did send him to see Dr. [name] of interventional cardiology. He underwent an angiogram on 08/06/17 and he underwent a PTA and directional atherectomy of the lesser distal SFA and popliteal arteries which resulted in brisk improvement in blood flow. It was noted that he had 2 vessel runoff through the anterior tibial and peroneal. He is also been to see vascular and interventional radiologist. He was not felt to have any significant superficial venous insufficiency. Presumably is not a candidate for any ablation. It was suggested he come here for wound care. The patient is a type II diabetic on insulin. He also has a history of venous insufficiency. ABIs on the left were noncompressible in our clinic 10/21/17; patient we admitted to the clinic last week. 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 We put Iodosorb on him after an aggressive debridement and 3 layer compression. He complained of pain in his ankle and itching with is skin in fact he scratched the area on the medial calf superiorly at the rim of our wraps and he has 2 small open areas in that location today which are new. I changed his primary dressing today to silver collagen. As noted he is already had revascularization and does not have any significant superficial venous insufficiency that would be amenable to ablation 10/28/17; patient admitted to the clinic 2 weeks ago. He has a smaller Wound. Scratch injury from last week revealed. There is large wound over the tibial area. This is  smaller. Granulation looks healthy. No need for debridement. 11/04/17; the wound on the left lateral calf looks better. Improved dimensions. Surface of this looks better. We've been maintaining him and Kerlix Coban wraps. He finds this much more comfortable. Silver collagen dressing 11/11/17; left lateral Wound  continues to look healthy be making progress. Using a #5 curet I removed removed nonviable skin from the surface of the wound and then necrotic debris from the wound surface. Surface of the wound continues to look healthy. ooHe also has an open area on the left great toenail bed. We've been using topical antibiotics. 11/19/17; left anterior lateral wound continues to look healthy but it's not closed. ooHe also had a small wound above this on the left leg ooInitially traumatic wounds in the setting of significant chronic venous insufficiency and stasis dermatitis 11/25/17; left anterior wounds superiorly is closed still a small wound inferiorly. 12/02/17; left anterior tibial area. Arrives today with adherent callus. Post debridement clearly not completely closed. Hydrofera Blue under 3 layer compression. 12/09/17; left anterior tibia. Circumferential eschar however the wound bed looks stable to improved. We've been using Hydrofera Blue under 3 layer compression 12/17/17; left anterior tibia. Apparently this was felt to be closed however when the wrap was taken off there is a skin tear to reopen wounds in the same area we've been using Hydrofera Blue under 3 layer compression 12/23/17 left anterior tibia. Not close to close this week apparently the [facility] was stuck to this again. Still circumferential eschar requiring debridement. I put a contact layer on this this time under the Hydrofera Blue 12/31/17; left anterior tibia. Wound is better slight amount of hyper-granulation. Using Hydrofera Blue over Adaptic. 01/07/18; left anterior tibia. The wound had some surface eschar however after this was removed he has no open wound.he was already revascularized by Dr. [name] when he came to our clinic with atherectomy of the left SFA and popliteal artery. He was also sent to interventional radiology for venous reflux studies. He was not felt to have significant reflux but certainly has chronic venous changes  of his skin with hemosiderin deposition around this area. He will definitely need to lubricate his skin and wear compression stocking and I've talked to him about this. READMISSION 05/26/2018 This is a now 71 year old man we cared for with traumatic wounds on his left anterior lower extremity. He had been previously revascularized during that admission by Dr. [name]. Apparently in follow-up Dr. [name] noted that he had deterioration in his arterial status. He underwent a stent placement in the distal left SFA on 04/22/2018. 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 He went back to the angiography suite on 04/30/2018 he underwent PTA and balloon angioplasty of the occluded left mid anterior tibial artery, thrombotic occlusion went from 100 to 0% which reconstitutes the posterior tibial artery. He had thrombectomy and aspiration of the peroneal artery. The stent placed in the distal SFA left SFA was still occluded. 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 He has been recuperating at [facility] in [facility] after the most recent hospitalization. He is going home tomorrow. He tells me that 2 weeks ago he traumatized the tip of his left fifth toe. He came in urgently for  our review of this. This was a history of before I noted that Dr. [name] had already noted dry gangrenous changes of the left fifth toe 06/09/2018; 2-week follow-up. I did contact Dr. [name] after his last appointment and he apparently saw 1 of Dr. Irven Shelling colleagues the next day. He does not follow-up with Dr. [name] himself until Thursday of this week. 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 Nevertheless there is no evidence of infection no  drainage and no pain. He had a new area that this week when we were signing him in today on the left anterior mid tibia area, this is in close proximity to the previous wound we have dealt with in this clinic. [name], [name] ([id number]) [id number] Page 10 of 18 06/23/2018; 2-week follow-up. I did not receive a recent note from Dr. [name] to review today. Our office is trying to obtain this. He is apparently not planning to do further vascular interventions and wondered about compression to try and help with the patient's chronic venous insufficiency. However we are also concerned about the arterial flow. ooHe arrives in clinic today with a new area on the left third toe. The areas on the calf/anterior tibia are close to closing. The left fifth toe is still mummified using Betadine. -In reviewing things with the patient he has what sounds like claudication with mild to moderate amount of activity. 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 I prescribed Levaquin over the phone while we attempted to arrange a plan of care. However the patient called yesterday to report he had low-grade fever and he came in today acutely. There is been a marked deterioration in the left third toe with spreading cellulitis up into the dorsal left foot. He was referred to the emergency room. Readmission: 06/29/2020 patient presents today for reevaluation here in our clinic he was previously treated by Dr. Dellia Nims at the latter part of 2019 in 2 the beginning of 2020. Subsequently we have not seen him since that time in the interim he did have evaluation with vein and vascular specialist specifically Dr. Anice Paganini who did perform quite extensive work for a left femoral to anterior tibial artery bypass. With that being said in the interim the patient has developed significant lymphedema and has wounds that he tells me have really never healed in regard to the  incision site on the left leg. He also has multiple wounds on the feet for various reasons some of which is that he tends to pick at his feet. Fortunately there is no signs of active infection systemically at this time he does have some wounds that are little bit deeper but most are fairly superficial he seems to have good blood flow and overall everything appears to be healthy I see no bone exposed and no obvious signs of osteomyelitis. I do not know that he necessarily needs a x-ray at this point although that something we could consider depending on how things progress. The patient does have a history of lymphedema, diabetes, this is type II, chronic kidney disease stage III, hypertension, and history of peripheral vascular disease. 07/05/2020; patient admitted last week. Is a patient I remember from 2019 he had a spreading infection involving the left foot and we sent him to the hospital. 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 He subsequently had a left femoral to anterior tibial bypass by  Dr.Cain vein and vascular. He also has severe lymphedema with chronic skin changes related to that on the left leg. The most problematic area that was new today was on the left medial great toe. This was apparently a small area last week there was purulent drainage which our intake nurse cultured. Also areas on the left medial foot and heel left lateral foot. He has 2 areas on the left medial calf left lateral calf in the setting of the severe lymphedema. 07/13/2020 on evaluation today patient appears to be doing better in my opinion compared to his last visit. The good news is there is no signs of active infection systemically and locally I do not see any signs of infection either. He did have an x-ray which was negative that is great news he had a culture which showed MRSA but at the same time he is been on the doxycycline which has helped. I do think we may want to  extend this for 7 additional days 1/25; patient admitted to the clinic a few weeks ago. 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 We have been putting him under compression his edema control is a lot better but he is severe verricused skin on the left leg. He is really done quite well he still has an open area on the left medial calf and the left medial first metatarsal head. We have been using silver collagen on the leg silver alginate on the foot 07/27/2020 upon evaluation today patient appears to be doing decently well in regard to his wounds. He still has a lot of dry skin on the left leg. Some of this is starting to peel back and I think he may be able to have them out by removing some that today. Fortunately there is no signs of active infection at this time on the left leg although on the right leg he does appear to have swelling and erythema as well as some mild warmth to touch. This does have been concerned about the possibility of cellulitis although within the differential diagnosis I do think that potentially a DVT has to be at least considered. We need to rule that out before proceeding would just call in the cellulitis. Especially since he is having pain in the posterior aspect of his calf muscle. 2/8; the patient had seen sparingly. He has severe skin changes of chronic lymphedema in the left leg thickened hyperkeratotic verrucous skin. He has an open wound on the medial part of the left first met head left mid tibia. He also has a rim of nonepithelialized skin in the anterior mid tibia. He brought in the AmLactin lotion that was been prescribed although I am not sure under compression and its utility. There concern about cellulitis on the right lower leg the last time he was here. He was put on on antibiotics. His DVT rule out was negative. The right leg looks fine he is using his stocking on this area 08/10/2020 upon evaluation today patient  appears to be doing well with regard to his leg currently. He has been tolerating the dressing changes without complication. Fortunately there is no signs of active infection which is great news. Overall very pleased with where things stand. 2/22; the patient still has an area on the medial part of the left first met his head. This looks better than when I last saw this earlier this month he has a rim of epithelialization but still some surface debris.  Mostly everything on the left leg is healed. There is still a vulnerable in the left mid tibia area. 08/30/2020 upon evaluation today patient appears to be doing much better in regard to his wounds on his foot. 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 Nonetheless I think we will address that today I Minna send in a prescription for him in that regard. Overall though there does not appear to be any signs of significant worsening. 09/07/2020 on evaluation today patient's wounds over his left foot appear to be doing excellent. I do not see any signs of infection there is some callus buildup this can require debridement for certain but overall I feel like he is managing quite nicely. He still using the AmLactin cream which has been beneficial for him as well. 3/22; left foot wound is closed. There is no open area here. He is using ammonium lactate lotion to the lower extremities to help exfoliate dry cracked skin. He has compression stockings from elastic therapy in [place]. The wound on the medial part of his left first met head is healed today. READMISSION 04/12/2021 Mr. Leitzke is a patient we know fairly well he had a prolonged stay in clinic in 2019 with wounds on his left lateral and left anterior lower extremity in the setting of chronic venous insufficiency. More recently he was here earlier this year with  predominantly an area on his left foot first metatarsal head plantar and he says the plantar foot broke down on its not long after we discharged him but he did not come back here. The last few months areas of broken down on his left anterior and again the left lateral lower extremity. The leg itself is very swollen chronically enlarged a lot of hyperkeratotic dry Berry Q skin in the left lower leg. His edema extends well into the thigh. He was seen by Dr. Donzetta Matters. He had ABIs on 03/02/2021 showing an ABI on the right of 1 with a TBI of 0.72 his ABI in the left at 1.09 TBI of 0.99. Monophasic and biphasic waveforms on the right. On the left monophasic waveforms were noted he went on to have an angiogram on 03/27/2021 this showed the aortic aortic and iliac segments were free of flow-limiting stenosis the left common femoral vein to evaluate the left femoral to anterior tibial artery bypass was unobstructed the bypass was patent without any areas of stenosis. We discharged the patient in bilateral juxta lite stockings but very clearly that was not sufficient to control the swelling and maintain skin integrity. He is clearly going to need compression pumps. 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 This is fortunate because he is on his feet for long periods of time. 10/27; patient comes in with our intake nurse reporting copious amount of green drainage from the left anterior mid tibia the left dorsal foot and to a lesser extent the left medial mid tibia. We left the compression wrap on all week for the amount of edema in his left leg is quite a bit better. We use silver alginate as the primary dressing 11/3; edema control is good. Left anterior lower leg left medial lower leg and the plantar first metatarsal head. The left anterior lower leg required debridement. Deep tissue culture I did of  this wound showed MRSA I put him on 10 days of doxycycline which  she will start today. We have him in compression wraps. He has a security card and [employer] however he is retiring on November 15. We will need to then get him into a better offloading boot for the left foot perhaps a total contact cast 11/10; edema control is quite good. Left anterior and left medial lower leg wounds in the setting of chronic venous insufficiency and lymphedema. He also has a substantial area over the left plantar first metatarsal head. I treated him for MRSA that we identified on the major wound on the left anterior mid tibia with [name], [name] ([id number]) [id number] Page 11 of 18 doxycycline and gentamicin topically. He has significant hypergranulation on the left plantar foot wound. The patient is a diabetic but he does not have significant PAD 11/17; edema control is quite good. Left anterior and left medial lower leg wounds look better. The really concerning area remains the area on the left plantar first metatarsal head. He has a rim of epithelialization. 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 Starting today with a forefoot off loader but . possibly a total contact cast. 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 Arrives with a very significant odor from I think all of the wounds on the left leg and the left foot. Because of this I did not put a total contact cast on him today but will could still consider this. His wife was having cataract surgery which is the reason he missed the appointment 12/6. I saw this man 5 days ago with a swelling below the popliteal fossa. I thought he actually might have a Baker's cyst however the DVT rule out study that we could arrange right away was negative the technician told me this was not a  ruptured Baker's cyst. We attempted to get this aspirated by under ultrasound guidance in interventional radiology however all they did was an ultrasound however it shows an extensive fluid collection 62 x 8 x 9.4 in the left thigh and left calf. The patient states he thinks this started 8 days ago or so but he really is not complaining of any pain, fever or systemic symptoms. He has not ha 12/20; after some difficulty I managed to get the patient into see Dr. Donzetta Matters. Eventually he was taken into the hospital and had a drain put in the fluid collection below his left knee posteriorly extending into the posterior thigh. He still has the drain in place. Culture of this showed moderate staff aureus few Morganella and few Klebsiella he is now on doxycycline and ciprofloxacin as suggested by infectious disease he is on this for a month. The drain will remain in place until it stops draining 12/29; he comes in today with the 1 wound on his left leg and the area on the left plantar first met head significantly smaller. Both look healthy. He still has the drain in the left leg. He says he has to change this daily. Follows up with Dr. Donzetta Matters on January 11. 06/29/2021; the wounds that I am following on the left leg and left first met head continued to be quite healthy. However the area where his  inferior drain is in place had copious amounts of drainage which was green in color. The wound here is larger. Follows up with Dr. Gwenlyn Saran of vein and vascular his surgeon next week as well as infectious disease. He remains on ciprofloxacin and doxycycline. He is not complaining of excessive pain in either one of the drain areas 1/12; the patient saw vascular surgery and infectious disease. Vascular surgery has left the drain in place as there was still some notable drainage still see him back in 2 weeks. Dr. Velna Ochs stop the doxycycline and ciprofloxacin and I do not believe he follows up with them at this point. Culture I did  last week showed both doxycycline resistant MRSA and Pseudomonas not sensitive to ciprofloxacin although only in rare titers 1/19; the patient's wound on the left anterior lower leg is just about healed. We have continued healing of the area that was medially on the left leg. Left first plantar metatarsal head continues to get smaller. The major problem here is his 2 drain sites 1 on the left upper calf and lateral thigh. There is purulent drainage still from the left lateral thigh. I gave him antibiotics last week but we still have recultured. He has the drain in the area I think this is eventually going to have to come out. I suspect there will be a connecting wound to heal here perhaps with improved VAc 1/26; the patient had his drain removed by vein and vascular on 1/25/. This was a large pocket of fluid in his left thigh that seem to tunnel into his left upper calf. 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 His mention his Penrose drain was removed today. He now has a tunneling wound on his left calf and left thigh. Both of these probe widely towards each other although I cannot really prove that they connect. Both wounds on his lower leg anteriorly are closed and his area over the first metatarsal head on his right foot continues to improve. We are using Hydrofera Blue here. He also saw infectious disease culture of the abscess they noted was polymicrobial with MRSA, Morganella and Klebsiella he was treated with doxycycline and ciprofloxacin for 4 weeks ending on 07/03/2021. They did not recommend any further antibiotics. Notable that while he still had the Penrose drain in place last week he had purulent drainage coming out of the inferior IandD site this grew [facility], MRSA and Pseudomonas but there does not appear to be any active infection in this area today with the drain out and he is not systemically unwell 2/2; with regards to the drain sites the superior one on the  thigh actually is closed down the one on the upper left lateral calf measures about 8 and half centimeters which is an improvement seems to be less prominent although still with a lot of drainage. The only remaining wound is over the first metatarsal head on the left foot and this looks to be continuing to improve with Hydrofera Blue. 2/9; the area on his plantar left foot continues to contract. Callus around the wound edge. The drain sites specifically have not come down in depth. We put the wound VAC on Monday he changed the canister late last night our intake nurse reported a pocket of fluid perhaps caused by our compression wraps 2/16; continued improvement in left foot plantar wound. drainage site in the calf is not improved in terms of depth (wound vac) 2/23; continued improvement in the left foot  wound over the first metatarsal head. With regards to the drain sites the area on his thigh laterally is healed however the open area on his calf is small in terms of circumference by still probes in by about 15 cm. Within using the wound VAC. Hydrofera Blue on his foot 08/24/2021: The left first metatarsal head wound continues to improve. The wound bed is healthy with just some surrounding callus. Unfortunately the open drain site on his calf remains open and tunnels at least 15 cm (the extent of a Q-tip). This is despite several weeks of wound VAC treatment. Based on reading back through the notes, there has been really no significant change in the depth of the wound, although the orifice is smaller and the more cranial wound on his thigh has closed. I suspect the tunnel tracks nearly all the way to this location. 08/31/2021: Continued improvement in the left first metatarsal head wound. There has been absolutely no improvement to the long tunnel from his open drain site on his calf. We have tried to get him into see vascular surgery sooner to consider the possibility of simply filleting the tract open  and allowing it to heal from the bottom up, likely with a wound VAC. They have not yet scheduled a sooner appointment than his current mid April 09/14/2021: He was seen by vascular surgery and they took him to the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

## 2022-05-04 ENCOUNTER — Encounter (HOSPITAL_BASED_OUTPATIENT_CLINIC_OR_DEPARTMENT_OTHER): Payer: Medicare Other | Admitting: General Surgery

## 2022-05-04 DIAGNOSIS — I87332 Chronic venous hypertension (idiopathic) with ulcer and inflammation of left lower extremity: Secondary | ICD-10-CM | POA: Diagnosis not present

## 2022-05-07 NOTE — Progress Notes (Signed)
[name] ([id number]) [id number] Page 1 of 18 Visit Report for 05/04/2022 Chief Complaint Document Details Patient Name: Date of Service: [name], [name] 05/04/2022 8:45 A M Medical Record Number: [id number] Patient Account Number: [id number] Date of Birth/Sex: Treating RN: [birth year] (71 y.o. M) Primary Care Provider: [name] Other Clinician: Referring Provider: Treating Provider/Extender: [name] in Treatment: 62 Information Obtained from: Patient Chief Complaint Left leg and foot ulcers 04/12/2021; patient is here for wounds on his left lower leg and left plantar foot over the first metatarsal head Electronic Signature(s) Signed: 05/04/2022 9:46:43 AM By: Fredirick Maudlin MD FACS Entered By: Fredirick Maudlin on 05/04/2022 09:46:43 -------------------------------------------------------------------------------- Debridement Details Patient Name: Date of Service: [name]. 05/04/2022 8:45 A M Medical Record Number: [id number] Patient Account Number: [id number] Date of Birth/Sex: Treating RN: 24-Jan-1951 (71 y.o. [name] Primary Care Provider: [name] Other Clinician: Referring Provider: Treating Provider/Extender: [name] in Treatment: 55 Debridement Performed for Assessment: Wound #22 Left,Lateral Lower Leg Performed By: Physician Fredirick Maudlin, MD Debridement Type: Debridement Level of Consciousness (Pre-procedure): Awake and Alert Pre-procedure Verification/Time Out Yes - 09:28 Taken: Start Time: 09:29 Pain Control: Lidocaine 5% topical ointment T Area Debrided (L x W): otal 8 (cm) x 1.4 (cm) = 11.2 (cm) Tissue and other material debrided: Non-Viable, Slough, Slough Level: Non-Viable Tissue Debridement Description: Selective/Open Wound Instrument: Curette Bleeding: Minimum Hemostasis Achieved: Pressure Procedural Pain: 0 Post Procedural Pain:  0 Response to Treatment: Procedure was tolerated well Level of Consciousness (Post- Awake and Alert procedure): Post Debridement Measurements of Total Wound Length: (cm) 8 Width: (cm) 1.4 Depth: (cm) 0.1 Volume: (cm) 0.88 Character of Wound/Ulcer Post Debridement: Requires Further Debridement Post Procedure Diagnosis [name] ([id number]) [id number] Page 2 of 18 Same as Pre-procedure Notes Scribed for Dr. Celine Ahr by Blanche East, RN Electronic Signature(s) Signed: 05/04/2022 12:06:25 PM By: Fredirick Maudlin MD FACS Signed: 05/07/2022 5:04:49 PM By: Blanche East RN Entered By: Blanche East on 05/04/2022 09:31:28 -------------------------------------------------------------------------------- Debridement Details Patient Name: Date of Service: [name]. 05/04/2022 8:45 A M Medical Record Number: [id number] Patient Account Number: [id number] Date of Birth/Sex: Treating RN: [birth year] (71 y.o. [name] Primary Care Provider: [name] Other Clinician: Referring Provider: Treating Provider/Extender: [name] in Treatment: 55 Debridement Performed for Assessment: Wound #28 Left,Anterior Lower Leg Performed By: Physician Fredirick Maudlin, MD Debridement Type: Debridement Severity of Tissue Pre Debridement: Fat layer exposed Level of Consciousness (Pre-procedure): Awake and Alert Pre-procedure Verification/Time Out Yes - 09:28 Taken: Start Time: 09:29 Pain Control: Lidocaine 5% topical ointment T Area Debrided (L x W): otal 0.8 (cm) x 0.5 (cm) = 0.4 (cm) Tissue and other material debrided: Non-Viable, Slough, Slough Level: Non-Viable Tissue Debridement Description: Selective/Open Wound Instrument: Curette Bleeding: Minimum Hemostasis Achieved: Pressure Procedural Pain: 0 Post Procedural Pain: 0 Response to Treatment: Procedure was tolerated well Level of Consciousness (Post- Awake and  Alert procedure): Post Debridement Measurements of Total Wound Length: (cm) 0.8 Width: (cm) 0.5 Depth: (cm) 0.1 Volume: (cm) 0.031 Character of Wound/Ulcer Post Debridement: Requires Further Debridement Severity of Tissue Post Debridement: Fat layer exposed Post Procedure Diagnosis Same as Pre-procedure Notes Scribed for Dr. Celine Ahr by Blanche East, RN Electronic Signature(s) Signed: 05/04/2022 12:06:25 PM By: Fredirick Maudlin MD FACS Signed: 05/07/2022 5:04:49 PM By: Blanche East RN Entered By: Blanche East on 05/04/2022 09:31:43 [name] ([id number]) [id number] Page 3 of 18 -------------------------------------------------------------------------------- HPI Details Patient Name: Date of  Service: [name] 05/04/2022 8:45 A M Medical Record Number: [id number] Patient Account Number: [id number] Date of Birth/Sex: Treating RN: 07/26/1950 (71 y.o. M) Primary Care Provider: [name] Other Clinician: Referring Provider: Treating Provider/Extender: [name] in Treatment: 12 History of Present Illness HPI Description: 10/11/17; Mr. Studley is a 71 year old man who tells me that in 2015 he slipped down the latter traumatizing his left leg. He developed a wound in the same spot the area that we are currently looking at. He states this closed over for the most part although he always felt it was somewhat unstable. In 2016 he hit the same area with the door of his car had this reopened. He tells me that this is never really closed although sometimes an inflow it remains open on a constant basis. He has not been using any specific dressing to this except for topical antibiotics the nature of which were not really sure. His primary doctor did send him to see Dr. [name] of interventional cardiology. He underwent an angiogram on 08/06/17 and he underwent a PTA and directional atherectomy of the lesser distal SFA and popliteal  arteries which resulted in brisk improvement in blood flow. It was noted that he had 2 vessel runoff through the anterior tibial and peroneal. He is also been to see vascular and interventional radiologist. He was not felt to have any significant superficial venous insufficiency. Presumably is not a candidate for any ablation. It was suggested he come here for wound care. The patient is a type II diabetic on insulin. He also has a history of venous insufficiency. ABIs on the left were noncompressible in our clinic 10/21/17; patient we admitted to the clinic last week. 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 We put Iodosorb on him after an aggressive debridement and 3 layer compression. He complained of pain in his ankle and itching with is skin in fact he scratched the area on the medial calf superiorly at the rim of our wraps and he has 2 small open areas in that location today which are new. I changed his primary dressing today to silver collagen. As noted he is already had revascularization and does not have any significant superficial venous insufficiency that would be amenable to ablation 10/28/17; patient admitted to the clinic 2 weeks ago. He has a smaller Wound. Scratch injury from last week revealed. There is large wound over the tibial area. This is smaller. Granulation looks healthy. No need for debridement. 11/04/17; the wound on the left lateral calf looks better. Improved dimensions. Surface of this looks better. We've been maintaining him and Kerlix Coban wraps. He finds this much more comfortable. Silver collagen dressing 11/11/17; left lateral Wound continues to look healthy be making progress. Using a #5 curet I removed removed nonviable skin from the surface of the wound and then necrotic debris from the wound surface. Surface of the wound continues to look healthy. He also has an open area on the left great toenail  bed. We've been using topical antibiotics. 11/19/17; left anterior lateral wound continues to look healthy but it's not closed. He also had a small wound above this on the left leg Initially traumatic wounds in the setting of significant chronic venous insufficiency and stasis dermatitis 11/25/17; left anterior wounds superiorly is closed still a small wound inferiorly. 12/02/17; left anterior tibial area. Arrives today with adherent callus. Post debridement clearly not completely  closed. Hydrofera Blue under 3 layer compression. 12/09/17; left anterior tibia. Circumferential eschar however the wound bed looks stable to improved. We've been using Hydrofera Blue under 3 layer compression 12/17/17; left anterior tibia. Apparently this was felt to be closed however when the wrap was taken off there is a skin tear to reopen wounds in the same area we've been using Hydrofera Blue under 3 layer compression 12/23/17 left anterior tibia. Not close to close this week apparently the [facility] was stuck to this again. Still circumferential eschar requiring debridement. I put a contact layer on this this time under the Hydrofera Blue 12/31/17; left anterior tibia. Wound is better slight amount of hyper-granulation. Using Hydrofera Blue over Adaptic. 01/07/18; left anterior tibia. The wound had some surface eschar however after this was removed he has no open wound.he was already revascularized by Dr. [name] when he came to our clinic with atherectomy of the left SFA and popliteal artery. He was also sent to interventional radiology for venous reflux studies. He was not felt to have significant reflux but certainly has chronic venous changes of his skin with hemosiderin deposition around this area. He will definitely need to lubricate his skin and wear compression stocking and I've talked to him about this. READMISSION 05/26/2018 This is a now 71 year old man we cared for with traumatic wounds on his left anterior  lower extremity. He had been previously revascularized during that admission by Dr. [name]. Apparently in follow-up Dr. [name] noted that he had deterioration in his arterial status. He underwent a stent placement in the distal left SFA on 04/22/2018. 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 He went back to the angiography suite on 04/30/2018 he underwent PTA and balloon angioplasty of the occluded left mid anterior tibial artery, thrombotic occlusion went from 100 to 0% which reconstitutes the posterior tibial artery. He had thrombectomy and aspiration of the peroneal artery. The stent placed in the distal SFA left SFA was still occluded. 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 He has been recuperating at [place] home in [facility] after the most recent hospitalization. He is going home tomorrow. He tells me that 2 weeks ago he traumatized the tip of his left fifth toe. He came in urgently for our review of this. This was a history of before I noted that Dr. [name] had already noted dry gangrenous changes of the left fifth toe 06/09/2018; 2-week follow-up. I did contact Dr. [name] after his last appointment and he apparently saw 1 of Dr. Irven Shelling colleagues the next day. He does not follow-up with Dr. [name] himself until Thursday of this week. 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 Nevertheless there is no evidence of infection no drainage and no pain. He had a new area that this week when we were signing him in today on the left anterior mid tibia area, this is in close proximity to the previous wound we have dealt with in this clinic. 06/23/2018; 2-week follow-up. I did not receive a recent note from Dr.  [name] to review today. Our office is trying to obtain this. He is apparently not planning to do further vascular interventions and wondered about compression to try and  help with the patient's chronic venous insufficiency. However we are also concerned about the arterial flow. He arrives in clinic today with a new area on the left third toe. The areas on the calf/anterior tibia are close to closing. The left fifth toe is still mummified using Betadine. -In reviewing things with the patient he has what sounds like claudication with mild to moderate amount of activity. 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 I prescribed Levaquin over the phone while we attempted to arrange a plan of care. However the patient called yesterday to report he had low-grade fever and he came in today acutely. There is been a marked deterioration in the left third toe with spreading cellulitis up into the dorsal left foot. He was referred to the emergency room. Readmission: 06/29/2020 patient presents today for reevaluation here in our clinic he was previously treated by Dr. Dellia Nims at the latter part of 2019 in 2 the beginning of 2020. Subsequently we have not seen him since that time in the interim he did have evaluation with vein and vascular specialist specifically Dr. Anice Paganini who did perform quite extensive work for a left femoral to anterior tibial artery bypass. With that being said in the interim the patient has developed significant MICKEY, ESGUERRA ([id number]) [id number] Page 4 of 18 lymphedema and has wounds that he tells me have really never healed in regard to the incision site on the left leg. He also has multiple wounds on the feet for various reasons some of which is that he tends to pick at his feet. Fortunately there is no signs of active infection systemically at this time he does have some wounds that are little bit deeper but most are fairly  superficial he seems to have good blood flow and overall everything appears to be healthy I see no bone exposed and no obvious signs of osteomyelitis. I do not know that he necessarily needs a x-ray at this point although that something we could consider depending on how things progress. The patient does have a history of lymphedema, diabetes, this is type II, chronic kidney disease stage III, hypertension, and history of peripheral vascular disease. 07/05/2020; patient admitted last week. Is a patient I remember from 2019 he had a spreading infection involving the left foot and we sent him to the hospital. 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 He subsequently had a left femoral to anterior tibial bypass by Dr.Cain vein and vascular. He also has severe lymphedema with chronic skin changes related to that on the left leg. The most problematic area that was new today was on the left medial great toe. This was apparently a small area last week there was purulent drainage which our intake nurse cultured. Also areas on the left medial foot and heel left lateral foot. He has 2 areas on the left medial calf left lateral calf in the setting of the severe lymphedema. 07/13/2020 on evaluation today patient appears to be doing better in my opinion compared to his last visit. The good news is there is no signs of active infection systemically and locally I do not see any signs of infection either. He did have an x-ray which was negative that is great news he had a culture which showed MRSA but at the same time he is been on the doxycycline which has helped. I do think we may want to extend this for 7  additional days 1/25; patient admitted to the clinic a few weeks ago. 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 We have been putting him under compression his edema control is a lot better but he is severe verricused skin on the  left leg. He is really done quite well he still has an open area on the left medial calf and the left medial first metatarsal head. We have been using silver collagen on the leg silver alginate on the foot 07/27/2020 upon evaluation today patient appears to be doing decently well in regard to his wounds. He still has a lot of dry skin on the left leg. Some of this is starting to peel back and I think he may be able to have them out by removing some that today. Fortunately there is no signs of active infection at this time on the left leg although on the right leg he does appear to have swelling and erythema as well as some mild warmth to touch. This does have been concerned about the possibility of cellulitis although within the differential diagnosis I do think that potentially a DVT has to be at least considered. We need to rule that out before proceeding would just call in the cellulitis. Especially since he is having pain in the posterior aspect of his calf muscle. 2/8; the patient had seen sparingly. He has severe skin changes of chronic lymphedema in the left leg thickened hyperkeratotic verrucous skin. He has an open wound on the medial part of the left first met head left mid tibia. He also has a rim of nonepithelialized skin in the anterior mid tibia. He brought in the AmLactin lotion that was been prescribed although I am not sure under compression and its utility. There concern about cellulitis on the right lower leg the last time he was here. He was put on on antibiotics. His DVT rule out was negative. The right leg looks fine he is using his stocking on this area 08/10/2020 upon evaluation today patient appears to be doing well with regard to his leg currently. He has been tolerating the dressing changes without complication. Fortunately there is no signs of active infection which is great news. Overall very pleased with where things stand. 2/22; the patient still has an area on the medial  part of the left first met his head. This looks better than when I last saw this earlier this month he has a rim of epithelialization but still some surface debris. Mostly everything on the left leg is healed. There is still a vulnerable in the left mid tibia area. 08/30/2020 upon evaluation today patient appears to be doing much better in regard to his wounds on his foot. 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 Nonetheless I think we will address that today I Minna send in a prescription for him in that regard. Overall though there does not appear to be any signs of significant worsening. 09/07/2020 on evaluation today patient's wounds over his left foot appear to be doing excellent. I do not see any signs of infection there is some callus buildup this can require debridement for certain but overall I feel like he is managing quite nicely. He still using the AmLactin cream which has been beneficial for him as well. 3/22; left foot wound is closed. There is no  open area here. He is using ammonium lactate lotion to the lower extremities to help exfoliate dry cracked skin. He has compression stockings from elastic therapy in . The wound on the medial part of his left first met head is healed today. READMISSION 04/12/2021 Mr. Odonell is a patient we know fairly well he had a prolonged stay in clinic in 2019 with wounds on his left lateral and left anterior lower extremity in the setting of chronic venous insufficiency. More recently he was here earlier this year with predominantly an area on his left foot first metatarsal head plantar and he says the plantar foot broke down on its not long after we discharged him but he did not come back here. The last few months areas of broken down on his left anterior and again the left lateral lower extremity. The leg itself  is very swollen chronically enlarged a lot of hyperkeratotic dry Berry Q skin in the left lower leg. His edema extends well into the thigh. He was seen by Dr. Donzetta Matters. He had ABIs on 03/02/2021 showing an ABI on the right of 1 with a TBI of 0.72 his ABI in the left at 1.09 TBI of 0.99. Monophasic and biphasic waveforms on the right. On the left monophasic waveforms were noted he went on to have an angiogram on 03/27/2021 this showed the aortic aortic and iliac segments were free of flow-limiting stenosis the left common femoral vein to evaluate the left femoral to anterior tibial artery bypass was unobstructed the bypass was patent without any areas of stenosis. We discharged the patient in bilateral juxta lite stockings but very clearly that was not sufficient to control the swelling and maintain skin integrity. He is clearly going to need compression pumps. 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 This is fortunate because he is on his feet for long periods of time. 10/27; patient comes in with our intake nurse reporting copious amount of green drainage from the left anterior mid tibia the left dorsal foot and to a lesser extent the left medial mid tibia. We left the compression wrap on all week for the amount of edema in his left leg is quite a bit better. We use silver alginate as the primary dressing 11/3; edema control is good. Left anterior lower leg left medial lower leg and the plantar first metatarsal head. The left anterior lower leg required debridement. Deep tissue culture I did of this wound showed MRSA I put him on 10 days of doxycycline which she will start today. We have him in compression wraps. He has a security card and [employer] however he is retiring on November 15. We will need to then get him into a better offloading boot for the left foot perhaps a total contact cast 11/10; edema control is quite good. Left anterior and left medial  lower leg wounds in the setting of chronic venous insufficiency and lymphedema. He also has a substantial area over the left plantar first metatarsal head. 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 He has significant hypergranulation on the left plantar foot wound. The patient is a diabetic but he does not have significant PAD 11/17; edema control is quite good. Left anterior and left medial lower leg wounds look better. The really concerning area remains the area on the left plantar first  metatarsal head. He has a rim of epithelialization. 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 Starting today with a forefoot off loader but . possibly a total contact cast. 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 Arrives with a very significant odor from I think all of the wounds on the left leg and the left foot. Because of this I did not put a total contact cast on him today but will could still consider this. His wife was having cataract surgery which is the reason he missed the appointment 12/6. I saw this man 5 days ago with a swelling below the popliteal fossa. I thought he actually might have a Baker's cyst however the DVT rule out study that we could arrange right away was negative the technician told me this was not a ruptured Baker's cyst. We attempted to get this aspirated by under ultrasound guidance in interventional radiology however all they did was an ultrasound however it shows an extensive fluid collection 62 x 8 x 9.4 in the left thigh and left calf. The patient states he thinks this started 8 days ago or so but he really is not complaining of any pain, fever or systemic symptoms. He  has not ha [name], [name] ([id number]) [id number] Page 5 of 18 12/20; after some difficulty I managed to get the patient into see Dr. Donzetta Matters. Eventually he was taken into the hospital and had a drain put in the fluid collection below his left knee posteriorly extending into the posterior thigh. He still has the drain in place. Culture of this showed moderate staff aureus few Morganella and few Klebsiella he is now on doxycycline and ciprofloxacin as suggested by infectious disease he is on this for a month. The drain will remain in place until it stops draining 12/29; he comes in today with the 1 wound on his left leg and the area on the left plantar first met head significantly smaller. Both look healthy. He still has the drain in the left leg. He says he has to change this daily. Follows up with Dr. Donzetta Matters on January 11. 06/29/2021; the wounds that I am following on the left leg and left first met head continued to be quite healthy. However the area where his inferior drain is in place had copious amounts of drainage which was green in color. The wound here is larger. Follows up with Dr. Gwenlyn Saran of vein and vascular his surgeon next week as well as infectious disease. He remains on ciprofloxacin and doxycycline. He is not complaining of excessive pain in either one of the drain areas 1/12; the patient saw vascular surgery and infectious disease. Vascular surgery has left the drain in place as there was still some notable drainage still see him back in 2 weeks. Dr. Velna Ochs stop the doxycycline and ciprofloxacin and I do not believe he follows up with them at this point. Culture I did last week showed both doxycycline resistant MRSA and Pseudomonas not sensitive to ciprofloxacin although only in rare titers 1/19; the patient's wound on the left anterior lower leg is just about healed. We have continued healing of the area that was medially on the left leg. Left first plantar  metatarsal head continues to get smaller.  The major problem here is his 2 drain sites 1 on the left upper calf and lateral thigh. There is purulent drainage still from the left lateral thigh. I gave him antibiotics last week but we still have recultured. He has the drain in the area I think this is eventually going to have to come out. I suspect there will be a connecting wound to heal here perhaps with improved VAc 1/26; the patient had his drain removed by vein and vascular on 1/25/. This was a large pocket of fluid in his left thigh that seem to tunnel into his left upper calf. 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 His mention his Penrose drain was removed today. He now has a tunneling wound on his left calf and left thigh. Both of these probe widely towards each other although I cannot really prove that they connect. Both wounds on his lower leg anteriorly are closed and his area over the first metatarsal head on his right foot continues to improve. We are using Hydrofera Blue here. He also saw infectious disease culture of the abscess they noted was polymicrobial with MRSA, Morganella and Klebsiella he was treated with doxycycline and ciprofloxacin for 4 weeks ending on 07/03/2021. They did not recommend any further antibiotics. Notable that while he still had the Penrose drain in place last week he had purulent drainage coming out of the inferior IandD site this grew [facility], MRSA and Pseudomonas but there does not appear to be any active infection in this area today with the drain out and he is not systemically unwell 2/2; with regards to the drain sites the superior one on the thigh actually is closed down the one on the upper left lateral calf measures about 8 and half centimeters which is an improvement seems to be less prominent although still with a lot of drainage. The only remaining wound is over the first metatarsal head on the left foot and this looks to be  continuing to improve with Hydrofera Blue. 2/9; the area on his plantar left foot continues to contract. Callus around the wound edge. The drain sites specifically have not come down in depth. We put the wound VAC on Monday he changed the canister late last night our intake nurse reported a pocket of fluid perhaps caused by our compression wraps 2/16; continued improvement in left foot plantar wound. drainage site in the calf is not improved in terms of depth (wound vac) 2/23; continued improvement in the left foot wound over the first metatarsal head. With regards to the drain sites the area on his thigh laterally is healed however the open area on his calf is small in terms of circumference by still probes in by about 15 cm. Within using the wound VAC. Hydrofera Blue on his foot 08/24/2021: The left first metatarsal head wound continues to improve. The wound bed is healthy with just some surrounding callus. Unfortunately the open drain site on his calf remains open and tunnels at least 15 cm (the extent of a Q-tip). This is despite several weeks of wound VAC treatment. Based on reading back through the notes, there has been really no significant change in the depth of the wound, although the orifice is smaller and the more cranial wound on his thigh has closed. I suspect the tunnel tracks nearly all the way to this location. 08/31/2021: Continued improvement in the left first metatarsal head wound. There has been absolutely no improvement to the long tunnel  from his open drain site on his calf. We have tried to get him into see vascular surgery sooner to consider the possibility of simply filleting the tract open and allowing it to heal from the bottom up, likely with a wound VAC. They have not yet scheduled a sooner appointment than his current mid April 09/14/2021: He was seen by vascular surgery and they took him to the operating room last week. They opened a portion of the tunnel, but did not extend  the entire length of the known open subcutaneous tract. I read Dr. Claretha Cooper operative note and it is not clear from that documentation why only a portion of the tract was opened. The heaped up granulation tissue was curetted and removed from at least some portion of the tract. They did place a wound VAC and applied an Unna boot to the leg. The ulcer on his left first metatarsal head is smaller today. The bed looks good and there is just a small amount of surrounding callus. 09/21/2021: The ulcer on his left first metatarsal head looks to be stalled. There is some callus surrounding the wound but the wound bed itself does not appear particularly dynamic. The tunnel tract on his lateral left leg seems to be roughly the same length or perhaps slightly smaller but the wound bed appears healthy with good granulation tissue. He opened up a new wound on his medial thigh and the site of a prior surgical incision. He says that he did this unconsciously in his sleep by scratching. 09/28/2021: Unfortunately, the ulcer on his left first metatarsal head has extended underneath the callus toward the dorsum of his foot. The medial thigh wounds are roughly the same. The tunnel on his lateral left leg continues to be problematic; it is longer than we are able to actually probe with a Q-tip. I am still not certain as to why Dr. Donzetta Matters did not open this up entirely when he took the patient to the operating room. We will likely be back in the same situation with just a small superficial opening in a long unhealed tract, as the open portion is granulating in nicely. 10/02/2021: The patient was initially scheduled for a nurse visit, but we are also applying a total contact cast today. The plantar foot wound looks clean without significant accumulated callus. We have been applying Prisma silver collagen to the site. 10/05/2021: The patient is here for his first total contact cast change. We have tried using gauze packing strips in  the tunnel on his lateral leg wound, but this does not seem to be working any better than the white VAC foam. The foot ulcer looks about the same with minimal periwound callus. Medial thigh wound is clean with just some overlying eschar. 10/12/2021: The plantar foot wound is stable without any significant accumulation of periwound callus. The surface is viable with good granulation tissue. The medial thigh wounds are much smaller and are epithelializing. On the other hand, he had purulent drainage coming from the tunnel on his lateral leg. He does go back to see Dr. Donzetta Matters next week and is planning to ask him why the wound tunnel was not completely opened at the time of his most recent operation. 10/19/2021: The plantar foot wound is markedly improved and has epithelial tissue coming through the surface. The medial thigh wounds are nearly closed with just a tiny open area. He did see Dr. Donzetta Matters earlier this week and apparently they did discuss the possibility of opening the sinus  tract further and enabling a wound VAC application. Apparently there are some limits as to what Dr. 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 I think if we could get the tract open to the level of the popliteal fossa, this would greatly aid in her ability to get this chart closed. That being said, however, today when I probed the tract with a Q-tip, I was not able to insert the entirety of the Q-tip as I have on previous occasions. The tunnel is shorter by about 4 cm. The surface is clean with good granulation tissue and no further episodes of purulent drainage. 10/30/2021: Last week, the patient underwent surgery and had the long tract in his leg opened. There was a rind that was debrided, according to the operative report. His medial thigh ulcers are closed. The plantar foot wound is clean with a good surface and some built up surrounding callus. [name], [name] ([id number])  [id number] Page 6 of 18 11/06/2021: The overall dimensions of the large wound on his lateral leg remain about the same, but there is good granulation tissue present and the tunneling is a little bit shorter. He has a new wound on his anterior tibial surface, in the same location where he had a similar lesion in the past. The plantar foot wound is clean with some buildup surrounding callus. Just toward the medial aspect of his foot, however, there is an area of darkening that once debrided, revealed another opening in the skin surface. 11/13/2021: The anterior tibial surface wound is closed. The plantar foot wound has some surrounding callus buildup. The area of darkening that I debrided last week and revealed an opening in the skin surface has closed again. The tunnel in the large wound on his lateral leg has come in by about 3 cm. There is healthy granulation tissue on the entire wound surface. 11/23/2021: The patient was out of town last week and did wet-to-dry dressings on his large wound. 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 Unfortunately, he picked open the wound on his left medial thigh. He says that it was itching and he just could not stop scratching it until it was open again. The wound on his plantar foot is smaller and has not accumulated a tremendous amount of callus. The lateral leg wound is shallower and the tunnel has also decreased in depth. There is just a little bit of slough accumulation on the surface. 11/30/2021: Another portion of his left medial thigh has been opened up. All of these wounds are fairly superficial with just a little bit of slough and eschar accumulation. The wound on his plantar foot is almost closed with just a bit of eschar and periwound callus accumulation. The lateral leg wound is nearly flush with the surrounding skin and the tunnel is markedly  shallower. 12/07/2021: There is just 1 open area on his left medial thigh. It is clean with just a little bit of perimeter eschar. The wound on his plantar foot continues to contract and just has some eschar and periwound callus accumulation. The lateral leg wound is closing at the more distal aspect and the tunnel is smaller. The surface is nearly flush with the surrounding skin and it has a good bed of granulation tissue. 12/14/2021: The thigh and foot wounds are closed. The lateral leg wound has closed over approximately half of its length. The tunnel continues to contract and the  surface is now flush with the surrounding skin. The wound bed has robust granulation tissue. 12/22/2021: The thigh and foot wounds have reopened. The foot wound has a lot of callus accumulation around and over it. The thigh wound is tiny with just a little bit of slough in the wound bed. The lateral leg wound continues to contract. His vascular surgeon took the wound VAC off earlier in the week and the patient has been doing wet-to-dry dressings. There is a little slough accumulation on the surface. The tunnel is about 3 cm in depth at this point. 12/28/2021: The thigh wound is closed again. The foot wound has some callus that subsequently has peeled back exposing just a small slit of a wound. The lateral leg wound Is down to about half the size that it originally was and the tunnel is down to about half a centimeter in depth. 01/04/2022: The thigh wound remains closed. The foot wound has heavy callus overlying the wound site. Once this was debrided, the wound was found to be closed. The lateral leg wound is smaller again this week and very superficial. No tunnel could be identified. 01/12/2022: The thigh and foot wounds both remain closed. The lateral leg wound is now nearly flush with the skin surface. There is good granulation tissue present with a light layer of slough. 01/19/2022: Due to the way his wrap was placed, the  patient did not change the dressing on his thigh at all and so the foam was saturated and his skin is macerated. There is a light layer of slough on the wound surface. The underlying granulation tissue is robust and healthy-appearing. He has heavy callus buildup at the site of his first metatarsal head wound which is still healed. 02/01/2022: He has been in silver alginate. When he removed the dressing from his thigh wound, however, some leg, superficially reopening a portion of the wound that had healed. In addition, underneath the callus at his left first metatarsal head, there appears to be a blister and the wound appears to be open again. 02/08/2022: The lateral leg wound has contracted substantially. There is eschar and a light layer of slough present. He says that it is starting to pull and is uncomfortable. On inspection, there is some puckering of the scar and the eschar is quite dry; this may account for his symptoms. On his first metatarsal head, the wound is much smaller with just some eschar on the surface. The callus has not reaccumulated. He reports that he had a blister come up on his medial thigh wound at the distal aspect. It popped and there is now an opening in his skin again. Looking back through his [place] of wound photos, there is what looks like a permanent suture just deep to this location and it may be trying to erode through. We have been using silver alginate on his wounds. 02/15/2022: The lateral leg wound is about half the size it was last week. It is clean with just a little perimeter eschar and light slough. The wound on his first metatarsal head is about the same with heavy callus overlying it. The medial thigh wound is closed again. He does have some skin changes on the top of his foot that looks potentially yeast related. 02/22/2022: The skin on the top of his foot improved with the use of a topical antifungal. The lateral leg wound continues to contract and is again  smaller this week. There is a little bit of slough and eschar on  the surface. The first metatarsal head wound is a little bit smaller but has reaccumulated a thick callus over the top. He decided to try to trim his toenail and ultimately took the entire nail off of his left great toe. 03/02/2022: His lateral leg wound continues to improve, as does the wound on his left great toe. Unfortunately, it appears that somehow his foot got wet and moisture seeped in through the opening causing his skin to lift. There is a large wound now overlying his first metatarsal on both the plantar, medial, and dorsal portion of his foot. There is necrotic tissue and slough present underneath the shaggy macerated skin. 03/08/2022: The lateral leg wound is smaller again today. There is just a light layer of slough and eschar on the surface. The great toe wound is smaller again today. The first metatarsal wound is a little bit smaller today and does not look nearly as necrotic and macerated. There is still slough and nonviable tissue present. 03/15/2022: The lateral leg wound is narrower and just has a little bit of light slough buildup. The first metatarsal wound still has a fair amount of moisture affecting the periwound skin. The great toe wound is healed. 03/22/2022: The lateral leg wound is now isolated to just at the level of his knee. There is some eschar and slough accumulation. The first metatarsal head wound has epithelialized tremendously and is about half the size that it was last week. He still has some maceration on the top of his foot and a fungal odor is present. 03/29/2022: T oday the patient's foot was macerated, suggesting that the cast got wet. The patient has also been picking at his dry skin and has enlarged the wound on his left lateral leg. In the time between having his cast removed and my evaluation, he had picked more dry skin and opened up additional wounds on his Achilles area and dorsal foot. The  plantar first metatarsal head wound, however, is smaller and clean with just macerated callus around the perimeter and light slough on the surface. The lateral leg wound measured a little bit larger but is also fairly clean with eschar and minimal slough. 04/02/2022: The patient had vascular studies done last Friday and so his cast was not applied. He is here today to have that done. 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 04/05/2022: Both wounds are smaller and quite clean. There is just a little biofilm on the lateral leg wound. 10/20; the patient has a wound on the left lateral surgical incision at the level of his lateral knee this looks clean and improved. He is using silver alginate. He also has an area on his left medial foot for which she is using Hydrofera Blue under a total contact cast both wounds are measuring smaller 04/20/2022: The plantar foot wound has contracted considerably and is very close to closing. The lateral leg wound was measured a little larger, but there was a tiny open area that was included in the measurements that was not included last week. He has some eschar around the perimeter but otherwise the wound looks clean. [name], [name] ([id number]) [id number] Page 7 of 18 04/27/2022: The lateral leg wound looks better this week. He says that midweek, he felt it was very dry and began applying hydrogel to the site. I think this was beneficial. The foot wound is nearly closed underneath a thick layer of dry skin and callus. 05/04/2022: The foot wound is healed. He has developed  a new small ulcer on his anterior tibial surface about midway up his leg. It has a little slough on the surface. The lateral leg wound still is fairly dry, but clean with just a little biofilm on the surface. Electronic Signature(s) Signed: 05/04/2022 9:47:51 AM By: Fredirick Maudlin MD FACS Entered By: Fredirick Maudlin on 05/04/2022  09:47:51 -------------------------------------------------------------------------------- Physical Exam Details Patient Name: Date of Service: [name]. 05/04/2022 8:45 A M Medical Record Number: [id number] Patient Account Number: [id number] Date of Birth/Sex: Treating RN: 1951-01-19 (71 y.o. M) Primary Care Provider: [name] Other Clinician: Referring Provider: Treating Provider/Extender: [name] in Treatment: 55 Constitutional . . . . No acute distress.Marland Kitchen Respiratory Normal work of breathing on room air.. Notes 05/04/2022: The foot wound is healed. He has developed a new small ulcer on his anterior tibial surface about midway up his leg. It has a little slough on the surface. The lateral leg wound still is fairly dry, but clean with just a little biofilm on the surface. Electronic Signature(s) Signed: 05/04/2022 9:49:31 AM By: Fredirick Maudlin MD FACS Entered By: Fredirick Maudlin on 05/04/2022 09:49:31 -------------------------------------------------------------------------------- Physician Orders Details Patient Name: Date of Service: [name]. 05/04/2022 8:45 A M Medical Record Number: [id number] Patient Account Number: [id number] Date of Birth/Sex: Treating RN: 1951-05-04 (71 y.o. [name] Primary Care Provider: [name] Other Clinician: Referring Provider: Treating Provider/Extender: [name] in Treatment: 22 Verbal / Phone Orders: No Diagnosis Coding Follow-up Appointments ppointment in 1 week. - Dr Celine Ahr - Room 1 Return A Anesthetic Wound #22 Left,Lateral Lower Leg (In clinic) Topical Lidocaine 4% applied to wound bed Bathing/ Shower/ Hygiene May shower with protection but do not get wound dressing(s) wet. - Use a cast protector so you can shower without getting your cast wet Edema Control - Lymphedema / SCD / Other Elevate legs to the level of the heart or above for 30 minutes  daily and/or when sitting, a frequency of: - throughout the day Avoid standing for long periods of time. ABDELRAHMAN, NAIR ([id number]) [id number] Page 8 of 18 Patient to wear own compression stockings every day. - on right leg; Moisturize legs daily. - Ammonium lactate to right leg daily Compression stocking or Garment 20-30 mm/Hg pressure to: - left leg daily Off-Loading Other: - minimal weight bearing left foot Wound Treatment Wound #22 - Lower Leg Wound Laterality: Left, Lateral Cleanser: Soap and Water 3 x Per Week/30 Days Discharge Instructions: May shower and wash wound with dial antibacterial soap and water prior to dressing change. Cleanser: Wound Cleanser 3 x Per Week/30 Days Discharge Instructions: Cleanse the wound with wound cleanser prior to applying a clean dressing using gauze sponges, not tissue or cotton balls. Peri-Wound Care: Sween Lotion (Moisturizing lotion) 3 x Per Week/30 Days Discharge Instructions: Apply moisturizing lotion to the leg Topical: Skintegrity Hydrogel 4 (oz) 3 x Per Week/30 Days Discharge Instructions: Apply hydrogel as directed Prim Dressing: Promogran Prisma Matrix, 4.34 (sq in) (silver collagen) 3 x Per Week/30 Days ary Discharge Instructions: Moisten collagen with saline or hydrogel Secondary Dressing: ABD Pad, 8x10 (Generic) 3 x Per Week/30 Days Discharge Instructions: Apply over primary dressing as directed. Secured With: Elastic Bandage 4 inch (ACE bandage) 3 x Per Week/30 Days Discharge Instructions: Secure with ACE bandage as directed. Electronic Signature(s) Signed: 05/04/2022 12:06:25 PM By: Fredirick Maudlin MD FACS Signed: 05/07/2022 5:04:49 PM By: Blanche East RN Entered By: Blanche East on 05/04/2022 10:00:29 -------------------------------------------------------------------------------- Problem  List Details Patient Name: Date of Service: ZHAIRE, LOCKER 05/04/2022 8:45 A M Medical Record Number:  [id number] Patient Account Number: [id number] Date of Birth/Sex: Treating RN: 21-Mar-1951 (71 y.o. 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 04/12/2021 No Yes severity TRACEY, STEWART ([id number]) [id number] Page 9 of 18 Inactive Problems ICD-10 Code Description Active Date Inactive Date E11.621 Type 2 diabetes mellitus with foot ulcer 04/12/2021 04/12/2021 E11.42 Type 2 diabetes mellitus with diabetic polyneuropathy 04/12/2021 04/12/2021 L02.416 Cutaneous abscess of left lower limb 06/13/2021 06/13/2021 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 05/04/2022 8:45 A M Medical Record Number: [id number] Patient Account Number: [id number] Date of Birth/Sex: Treating RN: 04/25/1951 (71 y.o. M) Primary Care Provider: [name] Other Clinician: Referring Provider: Treating Provider/Extender: [name] in Treatment:  65 Subjective Chief Complaint Information obtained from Patient Left leg and foot ulcers 04/12/2021; patient is here for wounds on his left lower leg and left plantar foot over the first metatarsal head History of Present Illness (HPI) 10/11/17; Mr. Natzke is a 71 year old man who tells me that in 2015 he slipped down the latter traumatizing his left leg. He developed a wound in the same spot the area that we are currently looking at. He states this closed over for the most part although he always felt it was somewhat unstable. In 2016 he hit the same area with the door of his car had this reopened. He tells me that this is never really closed although sometimes an inflow it remains open on a constant basis. He has not been using any specific dressing to this except for topical antibiotics the nature of which were not really sure. His primary doctor did send him to see Dr. [name] of interventional cardiology. He underwent an angiogram on 08/06/17 and he underwent a PTA and directional atherectomy of the lesser distal SFA and popliteal arteries which resulted in brisk improvement in blood flow. It was noted that he had 2 vessel runoff through the anterior tibial and peroneal. He is also been to see vascular and interventional radiologist. He was not felt to have any significant superficial venous insufficiency. Presumably is not a candidate for any ablation. It was suggested he come here for wound care. The patient is a type II diabetic on insulin. He also has a history of venous insufficiency. ABIs on the left were noncompressible in our clinic 10/21/17; patient we admitted to the clinic last week. 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 We put Iodosorb on him after an aggressive debridement and 3 layer compression. He complained of pain in his ankle and itching with is skin in fact he scratched the area on the medial  calf superiorly  at the rim of our wraps and he has 2 small open areas in that location today which are new. I changed his primary dressing today to silver collagen. As noted he is already had revascularization and does not have any significant superficial venous insufficiency that would be amenable to ablation 10/28/17; patient admitted to the clinic 2 weeks ago. He has a smaller Wound. Scratch injury from last week revealed. There is large wound over the tibial area. This is smaller. Granulation looks healthy. No need for debridement. 11/04/17; the wound on the left lateral calf looks better. Improved dimensions. Surface of this looks better. We've been maintaining him and Kerlix Coban wraps. He finds this much more comfortable. Silver collagen dressing 11/11/17; left lateral Wound continues to look healthy be making progress. Using a #5 curet I removed removed nonviable skin from the surface of the wound and then necrotic debris from the wound surface. Surface of the wound continues to look healthy. ooHe also has an open area on the left great toenail bed. We've been using topical antibiotics. [name], [name] ([id number]) [id number] Page 10 of 18 11/19/17; left anterior lateral wound continues to look healthy but it's not closed. ooHe also had a small wound above this on the left leg ooInitially traumatic wounds in the setting of significant chronic venous insufficiency and stasis dermatitis 11/25/17; left anterior wounds superiorly is closed still a small wound inferiorly. 12/02/17; left anterior tibial area. Arrives today with adherent callus. Post debridement clearly not completely closed. Hydrofera Blue under 3 layer compression. 12/09/17; left anterior tibia. Circumferential eschar however the wound bed looks stable to improved. We've been using Hydrofera Blue under 3 layer compression 12/17/17; left anterior tibia. Apparently this was felt to be closed however when the wrap was taken  off there is a skin tear to reopen wounds in the same area we've been using Hydrofera Blue under 3 layer compression 12/23/17 left anterior tibia. Not close to close this week apparently the [facility] was stuck to this again. Still circumferential eschar requiring debridement. I put a contact layer on this this time under the Hydrofera Blue 12/31/17; left anterior tibia. Wound is better slight amount of hyper-granulation. Using Hydrofera Blue over Adaptic. 01/07/18; left anterior tibia. The wound had some surface eschar however after this was removed he has no open wound.he was already revascularized by Dr. [name] when he came to our clinic with atherectomy of the left SFA and popliteal artery. He was also sent to interventional radiology for venous reflux studies. He was not felt to have significant reflux but certainly has chronic venous changes of his skin with hemosiderin deposition around this area. He will definitely need to lubricate his skin and wear compression stocking and I've talked to him about this. READMISSION 05/26/2018 This is a now 71 year old man we cared for with traumatic wounds on his left anterior lower extremity. He had been previously revascularized during that admission by Dr. [name]. Apparently in follow-up Dr. [name] noted that he had deterioration in his arterial status. He underwent a stent placement in the distal left SFA on 04/22/2018. 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 He went back to the angiography suite on 04/30/2018 he underwent PTA and balloon angioplasty of the occluded left mid anterior tibial artery, thrombotic occlusion went from 100 to 0% which reconstitutes the posterior tibial artery. He had thrombectomy and aspiration of the peroneal artery. The stent placed in the distal SFA left SFA was still occluded. He was  discharged on Xarelto, it was noted on the discharge summary from this hospitalization that he had gangrene at the tip of his  left fifth toe and there were expectations this would auto amputate. 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 He has been recuperating at [facility] in [facility] after the most recent hospitalization. He is going home tomorrow. He tells me that 2 weeks ago he traumatized the tip of his left fifth toe. He came in urgently for our review of this. This was a history of before I noted that Dr. [name] had already noted dry gangrenous changes of the left fifth toe 06/09/2018; 2-week follow-up. I did contact Dr. [name] after his last appointment and he apparently saw 1 of Dr. Irven Shelling colleagues the next day. He does not follow-up with Dr. [name] himself until Thursday of this week. 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 Nevertheless there is no evidence of infection no drainage and no pain. He had a new area that this week when we were signing him in today on the left anterior mid tibia area, this is in close proximity to the previous wound we have dealt with in this clinic. 06/23/2018; 2-week follow-up. I did not receive a recent note from Dr. [name] to review today. Our office is trying to obtain this. He is apparently not planning to do further vascular interventions and wondered about compression to try and help with the patient's chronic venous insufficiency. However we are also concerned about the arterial flow. ooHe arrives in clinic today with a new area on the left third toe. The areas on the calf/anterior tibia are close to closing. The left fifth toe is still mummified using Betadine. -In reviewing things with the patient he has what sounds like claudication with mild to moderate amount of activity. 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 I prescribed Levaquin over the phone while we attempted to arrange a plan of care. However the patient called yesterday to report he had  low-grade fever and he came in today acutely. There is been a marked deterioration in the left third toe with spreading cellulitis up into the dorsal left foot. He was referred to the emergency room. Readmission: 06/29/2020 patient presents today for reevaluation here in our clinic he was previously treated by Dr. Dellia Nims at the latter part of 2019 in 2 the beginning of 2020. Subsequently we have not seen him since that time in the interim he did have evaluation with vein and vascular specialist specifically Dr. Anice Paganini who did perform quite extensive work for a left femoral to anterior tibial artery bypass. With that being said in the interim the patient has developed significant lymphedema and has wounds that he tells me have really never healed in regard to the incision site on the left leg. He also has multiple wounds on the feet for various reasons some of which is that he tends to pick at his feet. Fortunately there is no signs of active infection systemically at this time he does have some wounds that are little bit deeper but most are fairly superficial he seems to have good blood flow and overall everything appears to be healthy I see no bone exposed and no obvious signs of osteomyelitis. I do not know that he necessarily needs a x-ray at this point although that something we could consider depending on  how things progress. The patient does have a history of lymphedema, diabetes, this is type II, chronic kidney disease stage III, hypertension, and history of peripheral vascular disease. 07/05/2020; patient admitted last week. Is a patient I remember from 2019 he had a spreading infection involving the left foot and we sent him to the hospital. 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 He subsequently had a left femoral to anterior tibial bypass by Dr.Cain vein and vascular. He also has severe lymphedema with chronic skin changes related to that on the  left leg. The most problematic area that was new today was on the left medial great toe. This was apparently a small area last week there was purulent drainage which our intake nurse cultured. Also areas on the left medial foot and heel left lateral foot. He has 2 areas on the left medial calf left lateral calf in the setting of the severe lymphedema. 07/13/2020 on evaluation today patient appears to be doing better in my opinion compared to his last visit. The good news is there is no signs of active infection systemically and locally I do not see any signs of infection either. He did have an x-ray which was negative that is great news he had a culture which showed MRSA but at the same time he is been on the doxycycline which has helped. I do think we may want to extend this for 7 additional days 1/25; patient admitted to the clinic a few weeks ago. 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 We have been putting him under compression his edema control is a lot better but he is severe verricused skin on the left leg. He is really done quite well he still has an open area on the left medial calf and the left medial first metatarsal head. We have been using silver collagen on the leg silver alginate on the foot 07/27/2020 upon evaluation today patient appears to be doing decently well in regard to his wounds. He still has a lot of dry skin on the left leg. Some of this is starting to peel back and I think he may be able to have them out by removing some that today. Fortunately there is no signs of active infection at this time on the left leg although on the right leg he does appear to have swelling and erythema as well as some mild warmth to touch. This does have been concerned about the possibility of cellulitis although within the differential diagnosis I do think that potentially a DVT has to be at least considered. We need to rule that out before proceeding  would just call in the cellulitis. Especially since he is having pain in the posterior aspect of his calf muscle. 2/8; the patient had seen sparingly. He has severe skin changes of chronic lymphedema in the left leg thickened hyperkeratotic verrucous skin. He has an open wound on the medial part of the left first met head left mid tibia. He also has a rim of nonepithelialized skin in the anterior mid tibia. He brought in the AmLactin lotion that was been prescribed although I am not sure under compression and its utility. There concern about cellulitis on the right lower leg the last time he was here. He was put on on antibiotics. His DVT rule out was negative. The right leg looks fine he is using his stocking on this area 08/10/2020 upon  evaluation today patient appears to be doing well with regard to his leg currently. He has been tolerating the dressing changes without complication. Fortunately there is no signs of active infection which is great news. Overall very pleased with where things stand. 2/22; the patient still has an area on the medial part of the left first met his head. This looks better than when I last saw this earlier this month he has a rim of epithelialization but still some surface debris. Mostly everything on the left leg is healed. There is still a vulnerable in the left mid tibia area. 08/30/2020 upon evaluation today patient appears to be doing much better in regard to his wounds on his foot. 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 Nonetheless I think we will address that today I Minna send in a prescription for him in that regard. Overall though there does not appear to be any signs of significant worsening. [name], [name] ([id number]) [id number] Page 11 of 18 09/07/2020 on evaluation today patient's wounds over  his left foot appear to be doing excellent. I do not see any signs of infection there is some callus buildup this can require debridement for certain but overall I feel like he is managing quite nicely. He still using the AmLactin cream which has been beneficial for him as well. 3/22; left foot wound is closed. There is no open area here. He is using ammonium lactate lotion to the lower extremities to help exfoliate dry cracked skin. He has compression stockings from elastic therapy in [place]. The wound on the medial part of his left first met head is healed today. READMISSION 04/12/2021 Mr. Liese is a patient we know fairly well he had a prolonged stay in clinic in 2019 with wounds on his left lateral and left anterior lower extremity in the setting of chronic venous insufficiency. More recently he was here earlier this year with predominantly an area on his left foot first metatarsal head plantar and he says the plantar foot broke down on its not long after we discharged him but he did not come back here. The last few months areas of broken down on his left anterior and again the left lateral lower extremity. The leg itself is very swollen chronically enlarged a lot of hyperkeratotic dry Berry Q skin in the left lower leg. His edema extends well into the thigh. He was seen by Dr. Donzetta Matters. He had ABIs on 03/02/2021 showing an ABI on the right of 1 with a TBI of 0.72 his ABI in the left at 1.09 TBI of 0.99. Monophasic and biphasic waveforms on the right. On the left monophasic waveforms were noted he went on to have an angiogram on 03/27/2021 this showed the aortic aortic and iliac segments were free of flow-limiting stenosis the left common femoral vein to evaluate the left femoral to anterior tibial artery bypass was unobstructed the bypass was patent without any areas of stenosis. We discharged the patient in bilateral juxta lite stockings but very clearly that was not sufficient to control the  swelling and maintain skin integrity. He is clearly going to need compression pumps. 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 This is fortunate because he is on his feet for long periods of time. 10/27; patient comes  in with our intake nurse reporting copious amount of green drainage from the left anterior mid tibia the left dorsal foot and to a lesser extent the left medial mid tibia. We left the compression wrap on all week for the amount of edema in his left leg is quite a bit better. We use silver alginate as the primary dressing 11/3; edema control is good. Left anterior lower leg left medial lower leg and the plantar first metatarsal head. The left anterior lower leg required debridement. Deep tissue culture I did of this wound showed MRSA I put him on 10 days of doxycycline which she will start today. We have him in compression wraps. He has a security card and [employer] however he is retiring on November 15. We will need to then get him into a better offloading boot for the left foot perhaps a total contact cast 11/10; edema control is quite good. Left anterior and left medial lower leg wounds in the setting of chronic venous insufficiency and lymphedema. He also has a substantial area over the left plantar first metatarsal head. 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 He has significant hypergranulation on the left plantar foot wound. The patient is a diabetic but he does not have significant PAD 11/17; edema control is quite good. Left anterior and left medial lower leg wounds look better. The really concerning area remains the area on the left plantar first metatarsal head. He has a rim of epithelialization. He has been using a surgical shoe The patient is now retired from a a [employer] I have gone over with him the need to offload this area  aggressively. Starting today with a forefoot off loader but . possibly a total contact cast. 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 Arrives with a very significant odor from I think all of the wounds on the left leg and the left foot. Because of this I did not put a total contact cast on him today but will could still consider this. His wife was having cataract surgery which is the reason he missed the appointment 12/6. I saw this man 5 days ago with a swelling below the popliteal fossa. I thought he actually might have a Baker's cyst however the DVT rule out study that we could arrange right away was negative the technician told me this was not a ruptured Baker's cyst. We attempted to get this aspirated by under ultrasound guidance in interventional radiology however all they did was an ultrasound however it shows an extensive fluid collection 62 x 8 x 9.4 in the left thigh and left calf. The patient states he thinks this started 8 days ago or so but he really is not complaining of any pain, fever or systemic symptoms. He has not ha 12/20; after some difficulty I managed to get the patient into see Dr. Donzetta Matters. Eventually he was taken into the hospital and had a drain put in the fluid collection below his left knee posteriorly extending into the posterior thigh. He still has the drain in place. Culture of this showed moderate staff aureus few Morganella and few Klebsiella he is now on doxycycline and ciprofloxacin as suggested by infectious disease he is on this for a month. The drain will remain in place  until it stops draining 12/29; he comes in today with the 1 wound on his left leg and the area on the left plantar first met head significantly smaller. Both look healthy. He still has the drain in the left leg. He says he has to change this daily. Follows up with Dr. Donzetta Matters on  January 11. 06/29/2021; the wounds that I am following on the left leg and left first met head continued to be quite healthy. However the area where his inferior drain is in place had copious amounts of drainage which was green in color. The wound here is larger. Follows up with Dr. Gwenlyn Saran of vein and vascular his surgeon next week as well as infectious disease. He remains on ciprofloxacin and doxycycline. He is not complaining of excessive pain in either one of the drain areas 1/12; the patient saw vascular surgery and infectious disease. Vascular surgery has left the drain in place as there was still some notable drainage still see him back in 2 weeks. Dr. Velna Ochs stop the doxycycline and ciprofloxacin and I do not believe he follows up with them at this point. Culture I did last week showed both doxycycline resistant MRSA and Pseudomonas not sensitive to ciprofloxacin although only in rare titers 1/19; the patient's wound on the left anterior lower leg is just about healed. We have continued healing of the area that was medially on the left leg. Left first plantar metatarsal head continues to get smaller. The major problem here is his 2 drain sites 1 on the left upper calf and lateral thigh. There is purulent drainage still from the left lateral thigh. I gave him antibiotics last week but we still have recultured. He has the drain in the area I think this is eventually going to have to come out. I suspect there will be a connecting wound to heal here perhaps with improved VAc 1/26; the patient had his drain removed by vein and vascular on 1/25/. This was a large pocket of fluid in his left thigh that seem to tunnel into his left upper calf. 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 His mention his Penrose drain was removed today. He now has a tunneling wound on his left calf and left thigh. Both of these probe widely towards each other although I cannot really prove that they connect.  Both wounds on his lower leg anteriorly are closed and his area over the first metatarsal head on his right foot continues to improve. We are using Hydrofera Blue here. He also saw infectious disease culture of the abscess they noted was polymicrobial with MRSA, Morganella and Klebsiella he was treated with doxycycline and ciprofloxacin for 4 weeks ending on 07/03/2021. They did not recommend any further antibiotics. Notable that while he still had the Penrose drain in place last week he had purulent drainage coming out of the inferior IandD site this grew [facility], MRSA and Pseudomonas but there does not appear to be any active infection in this area today with the drain out and he is not systemically unwell 2/2; with regards to the drain sites the superior one on the thigh actually is closed down the one on the upper left lateral calf measures about 8 and half centimeters which is an improvement seems to be less prominent although still with a lot of drainage. The only remaining wound is over the first metatarsal head on the left foot and this looks to be continuing to improve with Hydrofera Blue.  2/9; the area on his plantar left foot continues to contract. Callus around the wound edge. The drain sites specifically have not come down in depth. We put the wound VAC on Monday he changed the canister late last night our intake nurse reported a [name], [name] ([id number]) [id number] Page 12 of 18 pocket of fluid perhaps caused by our compression wraps 2/16; continued improvement in left foot plantar wound. drainage site in the calf is not improved in terms of depth (wound vac) 2/23; continued improvement in the left foot wound over the first metatarsal head. With regards to the drain sites the area on his thigh laterally is healed however the open area on his calf is small in terms of circumference by still probes in by about 15 cm. Within using the wound VAC. Hydrofera  Blue on his foot 08/24/2021: The left first metatarsal head wound continues to improve. The wound bed is healthy with just some surrounding callus. Unfortunately the open drain site on his calf remains open and tunnels at least 15 cm (the extent of a Q-tip). This is despite several weeks of wound VAC treatment. Based on reading back through the notes,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

## 2022-05-07 NOTE — Progress Notes (Signed)
SHYLER, HOLZMAN ([id number]) [id number] Page 1 of 10 Visit Report for 05/04/2022 Arrival Information Details Patient Name: Date of Service: [name], [name] 05/04/2022 8:45 A M Medical Record Number: [id number] Patient Account Number: [id number] Date of Birth/Sex: Treating RN: 08-16-1950 (71 y.o. [name] Primary Care Juliyah Mergen: [name] Other Clinician: Referring Adal Sereno: Treating Merwin Breden/Extender: [name] in Treatment: 47 Visit Information History Since Last Visit Added or deleted any medications: No Patient Arrived: Ambulatory Any new allergies or adverse reactions: No Arrival Time: 08:54 Had a fall or experienced change in No Accompanied By: self activities of daily living that may affect Transfer Assistance: None risk of falls: Patient Requires Transmission-Based Precautions: No Signs or symptoms of abuse/neglect since last visito No Patient Has Alerts: Yes Hospitalized since last visit: No Implantable device outside of the clinic excluding No cellular tissue based products placed in the center since last visit: Has Dressing in Place as Prescribed: Yes Pain Present Now: Yes Electronic Signature(s) Signed: 05/07/2022 5:04:49 PM By: Blanche East RN Entered By: Blanche East on 05/04/2022 08:55:24 -------------------------------------------------------------------------------- Encounter Discharge Information Details Patient Name: Date of Service: [name]. 05/04/2022 8:45 A M Medical Record Number: [id number] Patient Account Number: [id number] Date of Birth/Sex: Treating RN: 04-28-1951 (71 y.o. [name] Primary Care Ndeye Tenorio: [name] Other Clinician: Referring Toby Breithaupt: Treating Vicke Plotner/Extender: [name] in Treatment: 36 Encounter Discharge Information Items Post Procedure Vitals Discharge Condition: Stable Temperature (F): 98.4 Ambulatory Status:  Ambulatory Pulse (bpm): 60 Discharge Destination: Home Respiratory Rate (breaths/min): 16 Transportation: Private Auto Blood Pressure (mmHg): 139/76 Accompanied By: self Schedule Follow-up Appointment: Yes Clinical Summary of Care: Electronic Signature(s) Signed: 05/07/2022 5:04:49 PM By: Blanche East RN Entered By: Blanche East on 05/04/2022 09:49:48 [name] ([id number]) [id number] Page 2 of 10 -------------------------------------------------------------------------------- Lower Extremity Assessment Details Patient Name: Date of Service: [name], [name] 05/04/2022 8:45 A M Medical Record Number: [id number] Patient Account Number: [id number] Date of Birth/Sex: Treating RN: 1950-10-04 (71 y.o. [name] Primary Care Kye Silverstein: [name] Other Clinician: Referring Jeanita Carneiro: Treating Haille Pardi/Extender: [name] in Treatment: 55 Edema Assessment Assessed: [name]: No] [Right: No] Edema: [Left: Ye] [Right: s] Calf Left: Right: Point of Measurement: 41 cm From Medial Instep 47 cm Ankle Left: Right: Point of Measurement: 10 cm From Medial Instep 29 cm Vascular Assessment Pulses: Dorsalis Pedis Palpable: [Left:Yes] Electronic Signature(s) Signed: 05/07/2022 5:04:49 PM By: Blanche East RN Entered By: Blanche East on 05/04/2022 09:07:15 -------------------------------------------------------------------------------- Multi Wound Chart Details Patient Name: Date of Service: [name]. 05/04/2022 8:45 A M Medical Record Number: [id number] Patient Account Number: [id number] Date of Birth/Sex: Treating RN: January 13, 1951 (71 y.o. 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/Ulcer of the Lower Primary Etiology: Extremity Extremity Glaucoma, Sleep Apnea, Glaucoma, Sleep Apnea, Glaucoma, Sleep Apnea, Comorbid History: Hypertension, Peripheral Arterial Hypertension, Peripheral Arterial Hypertension, Peripheral Arterial Disease, Peripheral Venous Disease, Disease, Peripheral Venous Disease, Disease, Peripheral Venous Disease, Type II Diabetes, Gout, Osteoarthritis, Type II Diabetes, Gout, Osteoarthritis, Type II Diabetes, Gout, Osteoarthritis, Neuropathy Neuropathy Neuropathy  06/03/2021 02/01/2022 05/04/2022 Date Acquired: 1 13 0 Weeks of Treatment: Open Open Open Wound Status: No No No Wound Recurrence: Yes No No Clustered Wound: 3 N/A N/A Clustered Quantity: 8x1.4x0.1 0.1x0.1x0.1 0.8x0.5x0.1 Measurements L x W x D (cm) 8.796 0.008 0.314 A (cm) : rea 0.88 0.001 0.031 Volume (cm) : -433.40% 99.20% N/A % Reduction in A rea: 33.30% 99.00% N/A % Reduction in Volume: Full Thickness With Exposed Support Grade 1 Grade 1 Classification: Structures Medium Small Small Exudate A mount: Serosanguineous Serous Serous Exudate Type: red, brown amber amber Exudate Color: Fibrotic scar, thickened scar Distinct, outline attached Flat and Intact Wound Margin: Large (67-100%) None Present (0%) Medium (34-66%) Granulation A mount: Pink N/A Pink Granulation Quality: None Present (0%) None Present (0%) Medium (34-66%) Necrotic A mount: Fat Layer (Subcutaneous Tissue): Yes Fascia: No Fat Layer (Subcutaneous Tissue): Yes Exposed Structures: Fascia: No Fat Layer (Subcutaneous Tissue): No Fascia: No Tendon:  No Tendon: No Tendon: No Muscle: No Muscle: No Joint: No Joint: No Joint: No Bone: No Bone: No Bone: No Medium (34-66%) Large (67-100%) Small (1-33%) Epithelialization: Debridement - Selective/Open Wound N/A Debridement - Selective/Open Wound Debridement: Pre-procedure Verification/Time Out 09:28 N/A 09:28 Taken: Lidocaine 5% topical ointment N/A Lidocaine 5% topical ointment Pain Control: [place] Tissue Debrided: Non-Viable Tissue N/A Non-Viable Tissue Level: 11.2 N/A 0.4 Debridement A (sq cm): rea Curette N/A Curette Instrument: Minimum N/A Minimum Bleeding: Pressure N/A Pressure Hemostasis A chieved: 0 N/A 0 Procedural Pain: 0 N/A 0 Post Procedural Pain: Procedure was tolerated well N/A Procedure was tolerated well Debridement Treatment Response: 8x1.4x0.1 N/A 0.8x0.5x0.1 Post Debridement Measurements L x W x D (cm) 0.88 N/A 0.031 Post Debridement Volume: (cm) Scarring: Yes Callus: Yes Periwound Skin Texture: Dry/Scaly: Yes Dry/Scaly: Yes Maceration: Yes Periwound Skin Moisture: Maceration: No No Abnormalities Noted No Abnormalities Noted No Abnormalities Noted Periwound Skin Color: No Abnormality No Abnormality N/A Temperature: Yes N/A N/A Tenderness on Palpation: Debridement N/A Debridement Procedures Performed: Treatment Notes Electronic Signature(s) Signed: 05/04/2022 9:46:33 AM By: Fredirick Maudlin MD FACS Entered By: Fredirick Maudlin on 05/04/2022 09:46:32 -------------------------------------------------------------------------------- Multi-Disciplinary Care Plan Details Patient Name: Date of Service: [name]. 05/04/2022 8:45 A M Medical Record Number: [id number] Patient Account Number: [id number] Date of Birth/Sex: Treating RN: [birth year] (71 y.o. [name] Primary Care Keiondre Colee: [name] Other Clinician: Referring Johnmichael Melhorn: Treating Dailon Sheeran/Extender: Demarquis, Osley  ([id number]) [id number] Page 4 of 10 Weeks in Treatment: [place] reviewed with physician Active Inactive Venous Leg Ulcer Nursing Diagnoses: Knowledge deficit related to disease process and management Potential for venous Insuffiency (use before diagnosis confirmed) Goals: Patient will maintain optimal edema control Date Initiated: 07/27/2021 Target Resolution Date: 05/10/2022 Goal Status: Active Interventions: Assess peripheral edema status every visit. Treatment Activities: Therapeutic compression applied : 07/27/2021 Notes: Wound/Skin Impairment Nursing Diagnoses: Impaired tissue integrity Knowledge deficit related to ulceration/compromised skin integrity Goals: Patient will have a decrease in wound volume by X% from date: (specify in notes) Date Initiated: 04/12/2021 Date Inactivated: 01/04/2022 Target Resolution Date: 04/23/2021 Goal Status: Met Patient/caregiver will verbalize understanding of skin care regimen Date Initiated: 01/04/2022 Target Resolution Date: 05/25/2022 Goal Status: Active Ulcer/skin breakdown will have a volume reduction of 30% by week 4 Date Initiated: 04/12/2021 Date Inactivated: 04/27/2021 Target Resolution Date: 04/27/2021 Goal Status: Unmet Unmet Reason: infection Ulcer/skin breakdown will have a volume reduction of 50% by week 8 Date Initiated: 04/27/2021 Date Inactivated: 06/29/2021 Target Resolution Date: 06/24/2021 Goal Status: Met Interventions:  Assess patient/caregiver ability to obtain necessary supplies Assess patient/caregiver ability to perform ulcer/skin care regimen upon admission and as needed Assess ulceration(s) every visit Notes: Electronic Signature(s) Signed: 05/07/2022 5:04:49 PM By: Blanche East RN Entered By: Blanche East on 05/04/2022 09:14:19 -------------------------------------------------------------------------------- Pain Assessment Details Patient Name: Date of  Service: [name]. 05/04/2022 8:45 A M Medical Record Number: [id number] Patient Account Number: [id number] Date of Birth/Sex: Treating RN: Jan 26, 1951 (71 y.o. [name] Primary Care Ashleyann Shoun: [name] Other Clinician: Referring Baylei Siebels: Treating Kierston Plasencia/Extender: [name] in Treatment: [address] [name], [name] ([id number]) [id number] Page 5 of 10 Location of Pain Severity and Description of Pain Patient Has Paino Yes Site Locations Rate the pain. Current Pain Level: 5 Character of Pain Describe the Pain: Aching Pain Management and Medication Current Pain Management: Electronic Signature(s) Signed: 05/07/2022 5:04:49 PM By: Blanche East RN Entered By: Blanche East on 05/04/2022 08:57:43 -------------------------------------------------------------------------------- Patient/Caregiver Education Details Patient Name: Date of Service: [name] 11/10/2023andnbsp8:45 A M Medical Record Number: [id number] Patient Account Number: [id number] Date of Birth/Gender: Treating RN: May 05, 1951 (71 y.o. [name] Primary Care Physician: [name] Other Clinician: Referring Physician: Treating Physician/Extender: [name] in Treatment: 23 Education Assessment Education Provided To: Patient Education Topics Provided Elevated Blood Sugar/ Impact on Healing: Methods: Explain/Verbal Responses: Reinforcements needed, State content correctly Wound/Skin Impairment: Methods: Explain/Verbal Responses: Reinforcements needed, State content correctly Electronic Signature(s) Signed: 05/07/2022 5:04:49 PM By: Blanche East RN Entered By: Blanche East on 05/04/2022 [place], [place] W ([id number]) [id number] Page 6 of 10 -------------------------------------------------------------------------------- Wound Assessment Details Patient Name: Date  of Service: CAMDYN, BESKE 05/04/2022 8:45 A M Medical Record Number: [id number] Patient Account Number: [id number] Date of Birth/Sex: Treating RN: [birth year] (71 y.o. [name] Primary Care Donnovan Stamour: [name] Other Clinician: Referring Yolando Gillum: Treating Jocie Meroney/Extender: [name] in Treatment: 56 Wound Status Wound Number: 22 Primary Cyst Etiology: Wound Location: Left, Lateral Lower Leg Wound Open Wounding Event: Bump Status: Date Acquired: 06/03/2021 Comorbid Glaucoma, Sleep Apnea, Hypertension, Peripheral Arterial Disease, Weeks Of Treatment: 47 History: Peripheral Venous Disease, Type II Diabetes, Gout, Osteoarthritis, Clustered Wound: Yes Neuropathy Photos Wound Measurements Length: (cm) Width: (cm) Depth: (cm) Clustered Quantity: Area: (cm) Volume: (cm) 8 % Reduction in Area: -433.4% 1.4 % Reduction in Volume: 33.3% 0.1 Epithelialization: Medium (34-66%) 3 Tunneling: No 8.796 Undermining: No 0.88 Wound Description Classification: Full Thickness With Exposed Suppo Wound Margin: Fibrotic scar, thickened scar Exudate Amount: Medium Exudate Type: Serosanguineous Exudate Color: red, brown rt Structures Foul Odor After Cleansing: No Slough/Fibrino No Wound Bed Granulation Amount: Large (67-100%) Exposed Structure Granulation Quality: Pink Fascia Exposed: No Necrotic Amount: None Present (0%) Fat Layer (Subcutaneous Tissue) Exposed: Yes Tendon Exposed: No Muscle Exposed: No Joint Exposed: No Bone Exposed: No Periwound Skin Texture Texture Color No Abnormalities Noted: No No Abnormalities Noted: Yes Scarring: Yes Temperature / Pain Temperature: No Abnormality Moisture No Abnormalities Noted: No Tenderness on Palpation: Yes Dry / Scaly: Yes Treatment Notes Wound #22 (Lower Leg) Wound Laterality: Left, Lateral [name] ([id number]) [id number] Page 7 of 10 Cleanser Soap and  Water Discharge Instruction: May shower and wash wound with dial antibacterial soap and water prior to dressing change. Wound Cleanser Discharge Instruction: Cleanse the wound with wound cleanser prior to applying a clean dressing using gauze sponges, not tissue or cotton balls. Peri-Wound Care Sween Lotion (Moisturizing lotion) Discharge Instruction: Apply moisturizing lotion to the leg Topical  Skintegrity Hydrogel 4 (oz) Discharge Instruction: Apply hydrogel as directed Primary Dressing Promogran Prisma Matrix, 4.34 (sq in) (silver collagen) Discharge Instruction: Moisten collagen with saline or hydrogel Secondary Dressing ABD Pad, 8x10 Discharge Instruction: Apply over primary dressing as directed. Secured With Elastic Bandage 4 inch (ACE bandage) Discharge Instruction: Secure with ACE bandage as directed. Compression Wrap Compression Stockings Add-Ons Electronic Signature(s) Signed: 05/07/2022 5:04:49 PM By: Blanche East RN Entered By: Blanche East on 05/04/2022 09:12:16 -------------------------------------------------------------------------------- Wound Assessment Details Patient Name: Date of Service: [name]. 05/04/2022 8:45 A M Medical Record Number: [id number] Patient Account Number: [id number] Date of Birth/Sex: Treating RN: 1950/12/29 (70 y.o. [name] Primary Care Meet Weathington: [name] Other Clinician: Referring Aliah Eriksson: Treating Zaviyar Rahal/Extender: [name] in Treatment: 45 Wound Status Wound Number: 27 Primary Diabetic Wound/Ulcer of the Lower Extremity Etiology: Wound Location: Left, Medial Foot Wound Healed - Epithelialized Wounding Event: Blister Status: Date Acquired: 02/01/2022 Comorbid Glaucoma, Sleep Apnea, Hypertension, Peripheral Arterial Disease, Weeks Of Treatment: 13 History: Peripheral Venous Disease, Type II Diabetes, Gout, Osteoarthritis, Clustered Wound: No Neuropathy Photos TRELLIS, VANOVERBEKE  ([id number]) [id number] Page 8 of 10 Wound Measurements Length: (cm) Width: (cm) Depth: (cm) Area: (cm) Volume: (cm) 0 % Reduction in Area: 99.2% 0 % Reduction in Volume: 99% 0 Epithelialization: Large (67-100%) 0 Tunneling: No 0 Undermining: No Wound Description Classification: Grade 1 Wound Margin: Distinct, outline attached Exudate Amount: Small Exudate Type: Serous Exudate Color: amber Foul Odor After Cleansing: No Slough/Fibrino No Wound Bed Granulation Amount: None Present (0%) Exposed Structure Necrotic Amount: None Present (0%) Fascia Exposed: No Fat Layer (Subcutaneous Tissue) Exposed: No Tendon Exposed: No Muscle Exposed: No Joint Exposed: No Bone Exposed: No Periwound Skin Texture Texture Color No Abnormalities Noted: No No Abnormalities Noted: Yes Callus: Yes Temperature / Pain Temperature: No Abnormality Moisture No Abnormalities Noted: No Dry / Scaly: Yes Maceration: No Treatment Notes Wound #27 (Foot) Wound Laterality: Left, Medial Cleanser Peri-Wound Care Sween Lotion (Moisturizing lotion) Discharge Instruction: Apply moisturizing lotion as directed Topical Primary Dressing Hydrofera Blue Classic Foam, 4x4 in Discharge Instruction: Moisten with saline prior to applying to wound bed Secondary Dressing Optifoam Non-Adhesive Dressing, 4x4 in Discharge Instruction: Apply over primary dressing cut to make foam donut Woven Gauze Sponges 2x2 in Discharge Instruction: Apply over primary dressing as directed. Secured With 65M Medipore H Soft Cloth Surgical T ape, 4 x 10 (in/yd) Discharge Instruction: Secure with tape as directed. Compression Wrap Compression Stockings Add-Ons TCC-EZ Cast Boot, [place] Discharge Instruction: SIZE 3 TCC Electronic Signature(s) Signed: 05/07/2022 5:04:49 PM By: Blanche East RN Entered By: Blanche East on 05/04/2022 09:51:44 [name] ([id number])  [id number] Page 9 of 10 -------------------------------------------------------------------------------- Wound Assessment Details Patient Name: Date of Service: [name], [name] 05/04/2022 8:45 A M Medical Record Number: [id number] Patient Account Number: [id number] Date of Birth/Sex: Treating RN: 12-15-1950 (71 y.o. [name] Primary Care Avaneesh Pepitone: [name] Other Clinician: Referring Hyman Crossan: Treating Evangelos Paulino/Extender: [name] in Treatment: 21 Wound Status Wound Number: 28 Primary Diabetic Wound/Ulcer of the Lower Extremity Etiology: Wound Location: Left, Anterior Lower Leg Wound Open Wounding Event: Pressure Injury Status: Date Acquired: 05/04/2022 Comorbid Glaucoma, Sleep Apnea, Hypertension, Peripheral Arterial Disease, Weeks Of Treatment: 0 History: Peripheral Venous Disease, Type II Diabetes, Gout, Osteoarthritis, Clustered Wound: No Neuropathy Photos Wound Measurements Length: (cm) 0 Width: (cm) 0 Depth: (cm) 0 Area: (cm) Volume: (cm) .8 % Reduction in Area: .5 % Reduction in Volume: .1 Epithelialization: Small (  1-33%) 0.314 Tunneling: No 0.031 Undermining: No Wound Description Classification: Grade 1 Wound Margin: Flat and Intact Exudate Amount: Small Exudate Type: Serous Exudate Color: amber Foul Odor After Cleansing: No Slough/Fibrino Yes Wound Bed Granulation Amount: Medium (34-66%) Exposed Structure Granulation Quality: Pink Fascia Exposed: No Necrotic Amount: Medium (34-66%) Fat Layer (Subcutaneous Tissue) Exposed: Yes Necrotic Quality: Adherent Slough Tendon Exposed: No Joint Exposed: No Bone Exposed: No Periwound Skin Texture Texture Color No Abnormalities Noted: No No Abnormalities Noted: Yes Moisture No Abnormalities Noted: No Maceration: Yes Treatment Notes Wound #28 (Lower Leg) Wound Laterality: Left, Anterior Cleanser Peri-Wound Care DEVON, PRETTY ([id number])  [id number] Page 10 of 10 Topical Primary Dressing Secondary Dressing Secured With Compression Wrap Compression Stockings Add-Ons Electronic Signature(s) Signed: 05/07/2022 5:04:49 PM By: Blanche East RN Entered By: Blanche East on 05/04/2022 09:13:31 -------------------------------------------------------------------------------- Vitals Details Patient Name: Date of Service: [name]. 05/04/2022 8:45 A M Medical Record Number: [id number] Patient Account Number: [id number] Date of Birth/Sex: Treating RN: 05-30-1951 (71 y.o. [name] Primary Care Adeeb Konecny: [name] Other Clinician: Referring Matilynn Dacey: Treating Renny Gunnarson/Extender: [name] in Treatment: 47 Vital Signs Time Taken: 08:55 Temperature (F): 98.4 Height (in): 74 Pulse (bpm): 60 Weight (lbs): 238 Respiratory Rate (breaths/min): 16 Body Mass Index (BMI): 30.6 Blood Pressure (mmHg): 139/76 Capillary Blood Glucose (mg/dl): 169 Reference Range: 80 - 120 mg / dl Electronic Signature(s) Signed: 05/07/2022 5:04:49 PM By: Blanche East RN Entered By: Blanche East on 05/04/2022 08:57:31

## 2022-05-11 ENCOUNTER — Encounter (HOSPITAL_BASED_OUTPATIENT_CLINIC_OR_DEPARTMENT_OTHER): Payer: Medicare Other | Admitting: General Surgery

## 2022-05-11 DIAGNOSIS — I87332 Chronic venous hypertension (idiopathic) with ulcer and inflammation of left lower extremity: Secondary | ICD-10-CM | POA: Diagnosis not present

## 2022-05-12 NOTE — Progress Notes (Signed)
ERNST, CUMPSTON ([id number]) [id number] Page 1 of 18 Visit Report for 05/11/2022 Chief Complaint Document Details Patient Name: Date of Service: [name], [name] 05/11/2022 10:30 A M Medical Record Number: [id number] Patient Account Number: [id number] Date of Birth/Sex: Treating RN: 1951-06-02 (71 y.o. M) Primary Care Provider: Jilda [name] Other Clinician: Referring Provider: Treating Provider/Extender: [name] in Treatment: 56 Information Obtained from: Patient Chief Complaint Left leg and foot ulcers 04/12/2021; patient is here for wounds on his left lower leg and left plantar foot over the first metatarsal head Electronic Signature(s) Signed: 05/11/2022 12:38:46 PM By: Fredirick Maudlin MD FACS Entered By: Fredirick Maudlin on 05/11/2022 12:38:46 -------------------------------------------------------------------------------- Debridement Details Patient Name: Date of Service: [name]. 05/11/2022 10:30 A M Medical Record Number: [id number] Patient Account Number: [id number] Date of Birth/Sex: Treating RN: [birth year] (71 y.o. [name], [name] Primary Care Provider: Jilda [name] Other Clinician: Referring Provider: Treating Provider/Extender: [name] in Treatment: 56 Debridement Performed for Assessment: Wound #22 Left,Lateral Lower Leg Performed By: Physician Fredirick Maudlin, MD Debridement Type: Debridement Level of Consciousness (Pre-procedure): Awake and Alert Pre-procedure Verification/Time Out Yes - 11:00 Taken: Start Time: 11:03 Pain Control: Lidocaine 4% T opical Solution T Area Debrided (L x W): otal 3.7 (cm) x 1.8 (cm) = 6.66 (cm) Tissue and other material debrided: Non-Viable, Eschar, Slough, Slough Level: Non-Viable Tissue Debridement Description: Selective/Open Wound Instrument: Curette Bleeding: Minimum Hemostasis Achieved: Pressure Procedural Pain: 0 Post  Procedural Pain: 0 Response to Treatment: Procedure was tolerated well Level of Consciousness (Post- Awake and Alert procedure): Post Debridement Measurements of Total Wound Length: (cm) 3.7 Width: (cm) 1.8 Depth: (cm) 0.1 Volume: (cm) 0.523 Character of Wound/Ulcer Post Debridement: Improved Post Procedure Diagnosis [name] ([id number]) [id number] Page 2 of 18 Same as Pre-procedure Notes Scribed for Dr. Celine Ahr by Baruch Gouty, RN Electronic Signature(s) Signed: 05/11/2022 12:43:40 PM By: Fredirick Maudlin MD FACS Signed: 05/11/2022 5:40:17 PM By: Baruch Gouty RN, BSN Entered By: Baruch Gouty on 05/11/2022 11:06:49 -------------------------------------------------------------------------------- HPI Details Patient Name: Date of Service: [name]. 05/11/2022 10:30 A M Medical Record Number: [id number] Patient Account Number: [id number] Date of Birth/Sex: Treating RN: [birth year] (71 y.o. M) Primary Care Provider: Jilda [name] Other Clinician: Referring Provider: Treating Provider/Extender: [name] in Treatment: 19 History of Present Illness HPI Description: 10/11/17; [name] is a 71 year old man who tells me that in 2015 he slipped down the latter traumatizing his left leg. He developed a wound in the same spot the area that we are currently looking at. He states this closed over for the most part although he always felt it was somewhat unstable. In 2016 he hit the same area with the door of his car had this reopened. He tells me that this is never really closed although sometimes an inflow it remains open on a constant basis. He has not been using any specific dressing to this except for topical antibiotics the nature of which were not really sure. His primary doctor did send him to see Dr. [name] of interventional cardiology. He underwent an angiogram on 08/06/17 and he underwent a PTA and  directional atherectomy of the lesser distal SFA and popliteal arteries which resulted in brisk improvement in blood flow. It was noted that he had 2 vessel runoff through the anterior tibial and peroneal. He is also been to see vascular and interventional radiologist. He was not felt to have any significant superficial venous insufficiency. Presumably is  not a candidate for any ablation. It was suggested he come here for wound care. The patient is a type II diabetic on insulin. He also has a history of venous insufficiency. ABIs on the left were noncompressible in our clinic 10/21/17; patient we admitted to the clinic last week. 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 We put Iodosorb on him after an aggressive debridement and 3 layer compression. He complained of pain in his ankle and itching with is skin in fact he scratched the area on the medial calf superiorly at the rim of our wraps and he has 2 small open areas in that location today which are new. I changed his primary dressing today to silver collagen. As noted he is already had revascularization and does not have any significant superficial venous insufficiency that would be amenable to ablation 10/28/17; patient admitted to the clinic 2 weeks ago. He has a smaller Wound. Scratch injury from last week revealed. There is large wound over the tibial area. This is smaller. Granulation looks healthy. No need for debridement. 11/04/17; the wound on the left lateral calf looks better. Improved dimensions. Surface of this looks better. We've been maintaining him and Kerlix Coban wraps. He finds this much more comfortable. Silver collagen dressing 11/11/17; left lateral Wound continues to look healthy be making progress. Using a #5 curet I removed removed nonviable skin from the surface of the wound and then necrotic debris from the wound surface. Surface of the wound continues to look  healthy. He also has an open area on the left great toenail bed. We've been using topical antibiotics. 11/19/17; left anterior lateral wound continues to look healthy but it's not closed. He also had a small wound above this on the left leg Initially traumatic wounds in the setting of significant chronic venous insufficiency and stasis dermatitis 11/25/17; left anterior wounds superiorly is closed still a small wound inferiorly. 12/02/17; left anterior tibial area. Arrives today with adherent callus. Post debridement clearly not completely closed. Hydrofera Blue under 3 layer compression. 12/09/17; left anterior tibia. Circumferential eschar however the wound bed looks stable to improved. We've been using Hydrofera Blue under 3 layer compression 12/17/17; left anterior tibia. Apparently this was felt to be closed however when the wrap was taken off there is a skin tear to reopen wounds in the same area we've been using Hydrofera Blue under 3 layer compression 12/23/17 left anterior tibia. Not close to close this week apparently the [facility] was stuck to this again. Still circumferential eschar requiring debridement. I put a contact layer on this this time under the Hydrofera Blue 12/31/17; left anterior tibia. Wound is better slight amount of hyper-granulation. Using Hydrofera Blue over Adaptic. 01/07/18; left anterior tibia. The wound had some surface eschar however after this was removed he has no open wound.he was already revascularized by Dr. [name] when he came to our clinic with atherectomy of the left SFA and popliteal artery. He was also sent to interventional radiology for venous reflux studies. He was not felt to have significant reflux but certainly has chronic venous changes of his skin with hemosiderin deposition around this area. He will definitely need to lubricate his skin and wear compression stocking and I've talked to him about this. READMISSION 05/26/2018 This is a now 71 year old  man we cared for with traumatic wounds on his left anterior lower extremity. He had been previously revascularized during that admission by  Dr. [name]. Apparently in follow-up Dr. [name] noted that he had deterioration in his arterial status. He underwent a stent placement in the distal left SFA on 04/22/2018. 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 He went back to the angiography suite on 04/30/2018 he underwent PTA and balloon angioplasty of the occluded left mid anterior tibial artery, thrombotic occlusion went from 100 to 0% which reconstitutes the posterior tibial artery. He had thrombectomy and aspiration of the peroneal artery. The stent placed in the distal SFA left SFA was still occluded. 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 [name], [name] ([id number]) [id number] Page 3 of 18 He has been recuperating at [place] home in [facility] after the most recent hospitalization. He is going home tomorrow. He tells me that 2 weeks ago he traumatized the tip of his left fifth toe. He came in urgently for our review of this. This was a history of before I noted that Dr. [name] had already noted dry gangrenous changes of the left fifth toe 06/09/2018; 2-week follow-up. I did contact Dr. [name] after his last appointment and he apparently saw 1 of Dr. Irven Shelling colleagues the next day. He does not follow-up with Dr. [name] himself until Thursday of this week. 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 Nevertheless there is no evidence of infection no drainage and no pain. He had a new area that this week when we were signing him in today on the left anterior mid tibia area, this is in  close proximity to the previous wound we have dealt with in this clinic. 06/23/2018; 2-week follow-up. I did not receive a recent note from Dr. [name] to review today. Our office is trying to obtain this. He is apparently not planning to do further vascular interventions and wondered about compression to try and help with the patient's chronic venous insufficiency. However we are also concerned about the arterial flow. He arrives in clinic today with a new area on the left third toe. The areas on the calf/anterior tibia are close to closing. The left fifth toe is still mummified using Betadine. -In reviewing things with the patient he has what sounds like claudication with mild to moderate amount of activity. 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 I prescribed Levaquin over the phone while we attempted to arrange a plan of care. However the patient called yesterday to report he had low-grade fever and he came in today acutely. There is been a marked deterioration in the left third toe with spreading cellulitis up into the dorsal left foot. He was referred to the emergency room. Readmission: 06/29/2020 patient presents today for reevaluation here in our clinic he was previously treated by Dr. Dellia Nims at the latter part of 2019 in 2 the beginning of 2020. Subsequently we have not seen him since that time in the interim he did have evaluation with vein and vascular specialist specifically Dr. Anice Paganini who did perform quite extensive work for a left femoral to anterior tibial artery bypass. With that being said in the interim the patient has developed significant lymphedema and has wounds that he tells me have really never healed in regard to the  incision site on the left leg. He also has multiple wounds on the feet for various reasons some of which is that he tends to pick at his feet. Fortunately there is no signs of active infection systemically at this time he does  have some wounds that are little bit deeper but most are fairly superficial he seems to have good blood flow and overall everything appears to be healthy I see no bone exposed and no obvious signs of osteomyelitis. I do not know that he necessarily needs a x-ray at this point although that something we could consider depending on how things progress. The patient does have a history of lymphedema, diabetes, this is type II, chronic kidney disease stage III, hypertension, and history of peripheral vascular disease. 07/05/2020; patient admitted last week. Is a patient I remember from 2019 he had a spreading infection involving the left foot and we sent him to the hospital. 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 He subsequently had a left femoral to anterior tibial bypass by Dr.Cain vein and vascular. He also has severe lymphedema with chronic skin changes related to that on the left leg. The most problematic area that was new today was on the left medial great toe. This was apparently a small area last week there was purulent drainage which our intake nurse cultured. Also areas on the left medial foot and heel left lateral foot. He has 2 areas on the left medial calf left lateral calf in the setting of the severe lymphedema. 07/13/2020 on evaluation today patient appears to be doing better in my opinion compared to his last visit. The good news is there is no signs of active infection systemically and locally I do not see any signs of infection either. He did have an x-ray which was negative that is great news he had a culture which showed MRSA but at the same time he is been on the doxycycline which has helped. I do think we may want to extend this for 7 additional days 1/25; patient admitted to the clinic a few weeks ago. 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 We have been putting him under compression his edema  control is a lot better but he is severe verricused skin on the left leg. He is really done quite well he still has an open area on the left medial calf and the left medial first metatarsal head. We have been using silver collagen on the leg silver alginate on the foot 07/27/2020 upon evaluation today patient appears to be doing decently well in regard to his wounds. He still has a lot of dry skin on the left leg. Some of this is starting to peel back and I think he may be able to have them out by removing some that today. Fortunately there is no signs of active infection at this time on the left leg although on the right leg he does appear to have swelling and erythema as well as some mild warmth to touch. This does have been concerned about the possibility of cellulitis although within the differential diagnosis I do think that potentially a DVT has to be at least considered. We need to rule that out before proceeding would just call in the cellulitis. Especially since he is having pain in the posterior aspect of his calf muscle. 2/8; the patient had seen sparingly. He has severe skin changes of chronic  lymphedema in the left leg thickened hyperkeratotic verrucous skin. He has an open wound on the medial part of the left first met head left mid tibia. He also has a rim of nonepithelialized skin in the anterior mid tibia. He brought in the AmLactin lotion that was been prescribed although I am not sure under compression and its utility. There concern about cellulitis on the right lower leg the last time he was here. He was put on on antibiotics. His DVT rule out was negative. The right leg looks fine he is using his stocking on this area 08/10/2020 upon evaluation today patient appears to be doing well with regard to his leg currently. He has been tolerating the dressing changes without complication. Fortunately there is no signs of active infection which is great news. Overall very pleased with where  things stand. 2/22; the patient still has an area on the medial part of the left first met his head. This looks better than when I last saw this earlier this month he has a rim of epithelialization but still some surface debris. Mostly everything on the left leg is healed. There is still a vulnerable in the left mid tibia area. 08/30/2020 upon evaluation today patient appears to be doing much better in regard to his wounds on his foot. 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 Nonetheless I think we will address that today I Minna send in a prescription for him in that regard. Overall though there does not appear to be any signs of significant worsening. 09/07/2020 on evaluation today patient's wounds over his left foot appear to be doing excellent. I do not see any signs of infection there is some callus buildup this can require debridement for certain but overall I feel like he is managing quite nicely. He still using the AmLactin cream which has been beneficial for him as well. 3/22; left foot wound is closed. There is no open area here. He is using ammonium lactate lotion to the lower extremities to help exfoliate dry cracked skin. He has compression stockings from elastic therapy in [place]. The wound on the medial part of his left first met head is healed today. READMISSION 04/12/2021 Mr. Piatt is a patient we know fairly well he had a prolonged stay in clinic in 2019 with wounds on his left lateral and left anterior lower extremity in the setting of chronic venous insufficiency. More recently he was here earlier this year with predominantly an area on his left foot first metatarsal head plantar and he says the plantar foot broke down on its not long after we discharged him but he did not come back here. The last few months areas of broken down on his  left anterior and again the left lateral lower extremity. The leg itself is very swollen chronically enlarged a lot of hyperkeratotic dry Berry Q skin in the left lower leg. His edema extends well into the thigh. He was seen by Dr. Donzetta Matters. He had ABIs on 03/02/2021 showing an ABI on the right of 1 with a TBI of 0.72 his ABI in the left at 1.09 TBI of 0.99. Monophasic and biphasic waveforms on the right. On the left monophasic waveforms were noted he went on to have an angiogram on 03/27/2021 this showed the aortic aortic and iliac segments were free of flow-limiting stenosis the left common femoral  vein to evaluate the left femoral to anterior tibial artery bypass was unobstructed the bypass was patent without any areas of stenosis. We discharged the patient in bilateral juxta lite stockings but very clearly that was not sufficient to control the swelling and maintain skin integrity. He is clearly going to need compression pumps. 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 This is fortunate because he is on his feet for long periods of time. 10/27; patient comes in with our intake nurse reporting copious amount of green drainage from the left anterior mid tibia the left dorsal foot and to a lesser extent the left medial mid tibia. We left the compression wrap on all week for the amount of edema in his left leg is quite a bit better. We use silver alginate as [name], [name] ([id number]) [id number] Page 4 of 18 the primary dressing 11/3; edema control is good. Left anterior lower leg left medial lower leg and the plantar first metatarsal head. The left anterior lower leg required debridement. Deep tissue culture I did of this wound showed MRSA I put him on 10 days of doxycycline which she will start today. We have him in compression wraps. He has a security card and [employer] however he is retiring on November 15. We will need to  then get him into a better offloading boot for the left foot perhaps a total contact cast 11/10; edema control is quite good. Left anterior and left medial lower leg wounds in the setting of chronic venous insufficiency and lymphedema. He also has a substantial area over the left plantar first metatarsal head. 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 He has significant hypergranulation on the left plantar foot wound. The patient is a diabetic but he does not have significant PAD 11/17; edema control is quite good. Left anterior and left medial lower leg wounds look better. The really concerning area remains the area on the left plantar first metatarsal head. He has a rim of epithelialization. 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 Starting today with a forefoot off loader but . possibly a total contact cast. 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 Arrives with a very significant odor from I think all of the wounds on the left leg and the left foot. Because of this I did not put a total contact cast on him today but will could still consider this. His wife was having cataract surgery which is the reason he missed the appointment 12/6. I saw this man 5 days ago with a swelling below the popliteal fossa. I thought he actually might have a Baker's cyst however the DVT rule out study that we could arrange right away was negative the technician told me this was not a ruptured Baker's cyst. We attempted to get this aspirated by under ultrasound guidance in interventional radiology however all they did was an ultrasound however it shows an extensive fluid collection 62 x 8 x 9.4 in the left  thigh and left calf. The patient states he thinks this started 8 days ago  or so but he really is not complaining of any pain, fever or systemic symptoms. He has not ha 12/20; after some difficulty I managed to get the patient into see Dr. Donzetta Matters. Eventually he was taken into the hospital and had a drain put in the fluid collection below his left knee posteriorly extending into the posterior thigh. He still has the drain in place. Culture of this showed moderate staff aureus few Morganella and few Klebsiella he is now on doxycycline and ciprofloxacin as suggested by infectious disease he is on this for a month. The drain will remain in place until it stops draining 12/29; he comes in today with the 1 wound on his left leg and the area on the left plantar first met head significantly smaller. Both look healthy. He still has the drain in the left leg. He says he has to change this daily. Follows up with Dr. Donzetta Matters on January 11. 06/29/2021; the wounds that I am following on the left leg and left first met head continued to be quite healthy. However the area where his inferior drain is in place had copious amounts of drainage which was green in color. The wound here is larger. Follows up with Dr. Gwenlyn Saran of vein and vascular his surgeon next week as well as infectious disease. He remains on ciprofloxacin and doxycycline. He is not complaining of excessive pain in either one of the drain areas 1/12; the patient saw vascular surgery and infectious disease. Vascular surgery has left the drain in place as there was still some notable drainage still see him back in 2 weeks. Dr. Velna Ochs stop the doxycycline and ciprofloxacin and I do not believe he follows up with them at this point. Culture I did last week showed both doxycycline resistant MRSA and Pseudomonas not sensitive to ciprofloxacin although only in rare titers 1/19; the patient's wound on the left anterior lower leg is just about healed. We have continued  healing of the area that was medially on the left leg. Left first plantar metatarsal head continues to get smaller. The major problem here is his 2 drain sites 1 on the left upper calf and lateral thigh. There is purulent drainage still from the left lateral thigh. I gave him antibiotics last week but we still have recultured. He has the drain in the area I think this is eventually going to have to come out. I suspect there will be a connecting wound to heal here perhaps with improved VAc 1/26; the patient had his drain removed by vein and vascular on 1/25/. This was a large pocket of fluid in his left thigh that seem to tunnel into his left upper calf. 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 His mention his Penrose drain was removed today. He now has a tunneling wound on his left calf and left thigh. Both of these probe widely towards each other although I cannot really prove that they connect. Both wounds on his lower leg anteriorly are closed and his area over the first metatarsal head on his right foot continues to improve. We are using Hydrofera Blue here. He also saw infectious disease culture of the abscess they noted was polymicrobial with MRSA, Morganella and Klebsiella he was treated with doxycycline and ciprofloxacin for 4 weeks ending on 07/03/2021. They did not recommend any further antibiotics. Notable that while he still had the Penrose drain in place last week he had purulent drainage coming out of the inferior IandD site this  grew [facility], MRSA and Pseudomonas but there does not appear to be any active infection in this area today with the drain out and he is not systemically unwell 2/2; with regards to the drain sites the superior one on the thigh actually is closed down the one on the upper left lateral calf measures about 8 and half centimeters which is an improvement seems to be less prominent although still with a lot of drainage. The only remaining wound  is over the first metatarsal head on the left foot and this looks to be continuing to improve with Hydrofera Blue. 2/9; the area on his plantar left foot continues to contract. Callus around the wound edge. The drain sites specifically have not come down in depth. We put the wound VAC on Monday he changed the canister late last night our intake nurse reported a pocket of fluid perhaps caused by our compression wraps 2/16; continued improvement in left foot plantar wound. drainage site in the calf is not improved in terms of depth (wound vac) 2/23; continued improvement in the left foot wound over the first metatarsal head. With regards to the drain sites the area on his thigh laterally is healed however the open area on his calf is small in terms of circumference by still probes in by about 15 cm. Within using the wound VAC. Hydrofera Blue on his foot 08/24/2021: The left first metatarsal head wound continues to improve. The wound bed is healthy with just some surrounding callus. Unfortunately the open drain site on his calf remains open and tunnels at least 15 cm (the extent of a Q-tip). This is despite several weeks of wound VAC treatment. Based on reading back through the notes, there has been really no significant change in the depth of the wound, although the orifice is smaller and the more cranial wound on his thigh has closed. I suspect the tunnel tracks nearly all the way to this location. 08/31/2021: Continued improvement in the left first metatarsal head wound. There has been absolutely no improvement to the long tunnel from his open drain site on his calf. We have tried to get him into see vascular surgery sooner to consider the possibility of simply filleting the tract open and allowing it to heal from the bottom up, likely with a wound VAC. They have not yet scheduled a sooner appointment than his current mid April 09/14/2021: He was seen by vascular surgery and they took him to the operating  room last week. They opened a portion of the tunnel, but did not extend the entire length of the known open subcutaneous tract. I read Dr. Claretha Cooper operative note and it is not clear from that documentation why only a portion of the tract was opened. The heaped up granulation tissue was curetted and removed from at least some portion of the tract. They did place a wound VAC and applied an Unna boot to the leg. The ulcer on his left first metatarsal head is smaller today. The bed looks good and there is just a small amount of surrounding callus. 09/21/2021: The ulcer on his left first metatarsal head looks to be stalled. There is some callus surrounding the wound but the wound bed itself does not appear particularly dynamic. The tunnel tract on his lateral left leg seems to be roughly the same length or perhaps slightly smaller but the wound bed appears healthy with good granulation tissue. He opened up a new wound on his medial thigh and the site of  a prior surgical incision. He says that he did this unconsciously in his sleep by scratching. 09/28/2021: Unfortunately, the ulcer on his left first metatarsal head has extended underneath the callus toward the dorsum of his foot. The medial thigh wounds are roughly the same. The tunnel on his lateral left leg continues to be problematic; it is longer than we are able to actually probe with a Q-tip. I am still not [name], [name] ([id number]) [id number] Page 5 of 18 certain as to why Dr. Donzetta Matters did not open this up entirely when he took the patient to the operating room. We will likely be back in the same situation with just a small superficial opening in a long unhealed tract, as the open portion is granulating in nicely. 10/02/2021: The patient was initially scheduled for a nurse visit, but we are also applying a total contact cast today. The plantar foot wound looks clean without significant accumulated callus. We have been applying  Prisma silver collagen to the site. 10/05/2021: The patient is here for his first total contact cast change. We have tried using gauze packing strips in the tunnel on his lateral leg wound, but this does not seem to be working any better than the white VAC foam. The foot ulcer looks about the same with minimal periwound callus. Medial thigh wound is clean with just some overlying eschar. 10/12/2021: The plantar foot wound is stable without any significant accumulation of periwound callus. The surface is viable with good granulation tissue. The medial thigh wounds are much smaller and are epithelializing. On the other hand, he had purulent drainage coming from the tunnel on his lateral leg. He does go back to see Dr. Donzetta Matters next week and is planning to ask him why the wound tunnel was not completely opened at the time of his most recent operation. 10/19/2021: The plantar foot wound is markedly improved and has epithelial tissue coming through the surface. The medial thigh wounds are nearly closed with just a tiny open area. He did see Dr. Donzetta Matters earlier this week and apparently they did discuss the possibility of opening the sinus tract further and enabling a wound VAC application. Apparently there are some limits as to what Dr. 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 I think if we could get the tract open to the level of the popliteal fossa, this would greatly aid in her ability to get this chart closed. That being said, however, today when I probed the tract with a Q-tip, I was not able to insert the entirety of the Q-tip as I have on previous occasions. The tunnel is shorter by about 4 cm. The surface is clean with good granulation tissue and no further episodes of purulent drainage. 10/30/2021: Last week, the patient underwent surgery and had the long tract in his leg opened. There was a rind that was debrided, according to the operative report. His medial thigh ulcers  are closed. The plantar foot wound is clean with a good surface and some built up surrounding callus. 11/06/2021: The overall dimensions of the large wound on his lateral leg remain about the same, but there is good granulation tissue present and the tunneling is a little bit shorter. He has a new wound on his anterior tibial surface, in the same location where he had a similar lesion in the past. The plantar foot wound is clean with some buildup surrounding callus. Just toward the medial aspect of his foot, however, there is an  area of darkening that once debrided, revealed another opening in the skin surface. 11/13/2021: The anterior tibial surface wound is closed. The plantar foot wound has some surrounding callus buildup. The area of darkening that I debrided last week and revealed an opening in the skin surface has closed again. The tunnel in the large wound on his lateral leg has come in by about 3 cm. There is healthy granulation tissue on the entire wound surface. 11/23/2021: The patient was out of town last week and did wet-to-dry dressings on his large wound. 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 Unfortunately, he picked open the wound on his left medial thigh. He says that it was itching and he just could not stop scratching it until it was open again. The wound on his plantar foot is smaller and has not accumulated a tremendous amount of callus. The lateral leg wound is shallower and the tunnel has also decreased in depth. There is just a little bit of slough accumulation on the surface. 11/30/2021: Another portion of his left medial thigh has been opened up. All of these wounds are fairly superficial with just a little bit of slough and eschar accumulation. The wound on his plantar foot is almost closed with just a bit of eschar and periwound callus accumulation. The lateral leg wound is nearly flush with the surrounding  skin and the tunnel is markedly shallower. 12/07/2021: There is just 1 open area on his left medial thigh. It is clean with just a little bit of perimeter eschar. The wound on his plantar foot continues to contract and just has some eschar and periwound callus accumulation. The lateral leg wound is closing at the more distal aspect and the tunnel is smaller. The surface is nearly flush with the surrounding skin and it has a good bed of granulation tissue. 12/14/2021: The thigh and foot wounds are closed. The lateral leg wound has closed over approximately half of its length. The tunnel continues to contract and the surface is now flush with the surrounding skin. The wound bed has robust granulation tissue. 12/22/2021: The thigh and foot wounds have reopened. The foot wound has a lot of callus accumulation around and over it. The thigh wound is tiny with just a little bit of slough in the wound bed. The lateral leg wound continues to contract. His vascular surgeon took the wound VAC off earlier in the week and the patient has been doing wet-to-dry dressings. There is a little slough accumulation on the surface. The tunnel is about 3 cm in depth at this point. 12/28/2021: The thigh wound is closed again. The foot wound has some callus that subsequently has peeled back exposing just a small slit of a wound. The lateral leg wound Is down to about half the size that it originally was and the tunnel is down to about half a centimeter in depth. 01/04/2022: The thigh wound remains closed. The foot wound has heavy callus overlying the wound site. Once this was debrided, the wound was found to be closed. The lateral leg wound is smaller again this week and very superficial. No tunnel could be identified. 01/12/2022: The thigh and foot wounds both remain closed. The lateral leg wound is now nearly flush with the skin surface. There is good granulation tissue present with a light layer of slough. 01/19/2022: Due to the  way his wrap was placed, the patient did not change the dressing  on his thigh at all and so the foam was saturated and his skin is macerated. There is a light layer of slough on the wound surface. The underlying granulation tissue is robust and healthy-appearing. He has heavy callus buildup at the site of his first metatarsal head wound which is still healed. 02/01/2022: He has been in silver alginate. When he removed the dressing from his thigh wound, however, some leg, superficially reopening a portion of the wound that had healed. In addition, underneath the callus at his left first metatarsal head, there appears to be a blister and the wound appears to be open again. 02/08/2022: The lateral leg wound has contracted substantially. There is eschar and a light layer of slough present. He says that it is starting to pull and is uncomfortable. On inspection, there is some puckering of the scar and the eschar is quite dry; this may account for his symptoms. On his first metatarsal head, the wound is much smaller with just some eschar on the surface. The callus has not reaccumulated. He reports that he had a blister come up on his medial thigh wound at the distal aspect. It popped and there is now an opening in his skin again. Looking back through his [place] of wound photos, there is what looks like a permanent suture just deep to this location and it may be trying to erode through. We have been using silver alginate on his wounds. 02/15/2022: The lateral leg wound is about half the size it was last week. It is clean with just a little perimeter eschar and light slough. The wound on his first metatarsal head is about the same with heavy callus overlying it. The medial thigh wound is closed again. He does have some skin changes on the top of his foot that looks potentially yeast related. 02/22/2022: The skin on the top of his foot improved with the use of a topical antifungal. The lateral leg wound continues  to contract and is again smaller this week. There is a little bit of slough and eschar on the surface. The first metatarsal head wound is a little bit smaller but has reaccumulated a thick callus over the top. He decided to try to trim his toenail and ultimately took the entire nail off of his left great toe. 03/02/2022: His lateral leg wound continues to improve, as does the wound on his left great toe. Unfortunately, it appears that somehow his foot got wet and moisture seeped in through the opening causing his skin to lift. There is a large wound now overlying his first metatarsal on both the plantar, medial, and dorsal portion of his foot. There is necrotic tissue and slough present underneath the shaggy macerated skin. 03/08/2022: The lateral leg wound is smaller again today. There is just a light layer of slough and eschar on the surface. The great toe wound is smaller again today. The first metatarsal wound is a little bit smaller today and does not look nearly as necrotic and macerated. There is still slough and nonviable tissue present. 03/15/2022: The lateral leg wound is narrower and just has a little bit of light slough buildup. The first metatarsal wound still has a fair amount of moisture [name], [name] ([id number]) [id number] Page 6 of 18 affecting the periwound skin. The great toe wound is healed. 03/22/2022: The lateral leg wound is now isolated to just at the level of his knee. There is some eschar and slough accumulation. The first metatarsal head wound has epithelialized  tremendously and is about half the size that it was last week. He still has some maceration on the top of his foot and a fungal odor is present. 03/29/2022: T oday the patient's foot was macerated, suggesting that the cast got wet. The patient has also been picking at his dry skin and has enlarged the wound on his left lateral leg. In the time between having his cast removed and my  evaluation, he had picked more dry skin and opened up additional wounds on his Achilles area and dorsal foot. The plantar first metatarsal head wound, however, is smaller and clean with just macerated callus around the perimeter and light slough on the surface. The lateral leg wound measured a little bit larger but is also fairly clean with eschar and minimal slough. 04/02/2022: The patient had vascular studies done last Friday and so his cast was not applied. He is here today to have that done. 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 04/05/2022: Both wounds are smaller and quite clean. There is just a little biofilm on the lateral leg wound. 10/20; the patient has a wound on the left lateral surgical incision at the level of his lateral knee this looks clean and improved. He is using silver alginate. He also has an area on his left medial foot for which she is using Hydrofera Blue under a total contact cast both wounds are measuring smaller 04/20/2022: The plantar foot wound has contracted considerably and is very close to closing. The lateral leg wound was measured a little larger, but there was a tiny open area that was included in the measurements that was not included last week. He has some eschar around the perimeter but otherwise the wound looks clean. 04/27/2022: The lateral leg wound looks better this week. He says that midweek, he felt it was very dry and began applying hydrogel to the site. I think this was beneficial. The foot wound is nearly closed underneath a thick layer of dry skin and callus. 05/04/2022: The foot wound is healed. He has developed a new small ulcer on his anterior tibial surface about midway up his leg. It has a little slough on the surface. The lateral leg wound still is fairly dry, but clean with just a little biofilm on the surface. 05/11/2022: The wound on his foot reopened on Wednesday. A large blister formed which then broke open revealing the fat  layer underneath. The ulcer on his anterior tibial surface is a little bit larger this week. The lateral leg wound has much better moisture balance this week. Fortunately, prior to his foot wound reopening, he did get the cast made for his orthotic. Electronic Signature(s) Signed: 05/11/2022 12:39:57 PM By: Fredirick Maudlin MD FACS Entered By: Fredirick Maudlin on 05/11/2022 12:39:57 -------------------------------------------------------------------------------- Physical Exam Details Patient Name: Date of Service: [name]. 05/11/2022 10:30 A M Medical Record Number: [id number] Patient Account Number: [id number] Date of Birth/Sex: Treating RN: 1951-01-26 (71 y.o. M) Primary Care Provider: Jilda [name] Other Clinician: Referring Provider: Treating Provider/Extender: [name] in Treatment: 34 Constitutional . . . . No acute distress.Marland Kitchen Respiratory Normal work of breathing on room air.. Notes 05/11/2022: The wound on his foot reopened on Wednesday. A large blister formed which then broke open revealing the fat layer underneath. The ulcer on his anterior tibial surface is a little bit larger this week. The lateral leg wound has much better moisture balance this week. Electronic Signature(s) Signed: 05/11/2022 12:41:22 PM By:  Fredirick Maudlin MD FACS Entered By: Fredirick Maudlin on 05/11/2022 12:41:22 -------------------------------------------------------------------------------- Physician Orders Details Patient Name: Date of Service: KYEN, TAITE 05/11/2022 10:30 A M Medical Record Number: [id number] Patient Account Number: [id number] KENNEITH, STIEF ([id number]) [id number] Page 7 of 18 Date of Birth/Sex: Treating RN: 1950-11-24 (71 y.o. [name] Primary Care Provider: Other Clinician: Jilda [name] Referring Provider: Treating Provider/Extender: [name] in Treatment: 33 Verbal /  Phone Orders: No Diagnosis Coding ICD-10 Coding Code Description 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 I89.0 Lymphedema, not elsewhere classified I87.322 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verity E11.621 Type 2 diabetes mellitus with foot ulcer L97.528 Non-pressure chronic ulcer of other part of left foot with other specified severity Follow-up Appointments ppointment in 1 week. - Dr Celine Ahr - Room 1 Return A Tuesday 11/21 @ 10:30 am Anesthetic Wound #22 Left,Lateral Lower Leg (In clinic) Topical Lidocaine 4% applied to wound bed Bathing/ Shower/ Hygiene May shower with protection but do not get wound dressing(s) wet. - Use a cast protector so you can shower without getting your cast wet Edema Control - Lymphedema / SCD / Other Elevate legs to the level of the heart or above for 30 minutes daily and/or when sitting, a frequency of: - throughout the day Avoid standing for long periods of time. Patient to wear own compression stockings every day. - on right leg; Moisturize legs daily. Compression stocking or Garment 20-30 mm/Hg pressure to: - left leg daily Off-Loading Total Contact Cast to Left Lower Extremity Other: - minimal weight bearing left foot Wound Treatment Wound #22 - Lower Leg Wound Laterality: Left, Lateral Cleanser: Soap and Water 3 x Per Week/30 Days Discharge Instructions: May shower and wash wound with dial antibacterial soap and water prior to dressing change. Cleanser: Wound Cleanser 3 x Per Week/30 Days Discharge Instructions: Cleanse the wound with wound cleanser prior to applying a clean dressing using gauze sponges, not tissue or cotton balls. Peri-Wound Care: Sween Lotion (Moisturizing lotion) 3 x Per Week/30 Days Discharge Instructions: Apply moisturizing lotion to the  leg Topical: Skintegrity Hydrogel 4 (oz) 3 x Per Week/30 Days Discharge Instructions: Apply hydrogel as directed Prim Dressing: Promogran Prisma Matrix, 4.34 (sq in) (silver collagen) 3 x Per Week/30 Days ary Discharge Instructions: Moisten collagen with saline or hydrogel Secondary Dressing: ABD Pad, 8x10 (Generic) 3 x Per Week/30 Days Discharge Instructions: Apply over primary dressing as directed. Secured With: Elastic Bandage 4 inch (ACE bandage) 3 x Per Week/30 Days Discharge Instructions: Secure with ACE bandage as directed. Wound #27R - Foot Wound Laterality: Left, Medial Prim Dressing: KerraCel Ag Gelling Fiber Dressing, 2x2 in (silver alginate) 1 x Per Week ary Discharge Instructions: Apply silver alginate to wound bed as instructed Secondary Dressing: Optifoam Non-Adhesive Dressing, 4x4 in 1 x Per Week Discharge Instructions: Apply over primary dressing cut to make foam donut Secured With: 64M Medipore H Soft Cloth Surgical T ape, 4 x 10 (in/yd) 1 x Per Week Discharge Instructions: Secure with tape as directed. Wound #28 - Lower Leg Wound Laterality: Left, Anterior Prim Dressing: KerraCel Ag Gelling Fiber Dressing, 2x2 in (silver alginate) ary 1 x Per Ceasar Lund ([id number]) [id number] Page 8 of 18 Discharge Instructions: Apply silver alginate to wound bed as instructed Secondary Dressing: Zetuvit Plus Silicone Border Dressing 4x4 (in/in) 1 x Per Week Discharge  Instructions: Apply silicone border over primary dressing as directed. Electronic Signature(s) Signed: 05/11/2022 12:43:40 PM By: Fredirick Maudlin MD FACS Entered By: Fredirick Maudlin on 05/11/2022 12:41:39 -------------------------------------------------------------------------------- Problem List Details Patient Name: Date of Service: [name]. 05/11/2022 10:30 A M Medical Record Number: [id number] Patient Account Number: [id number] Date of Birth/Sex: Treating  RN: 1950/12/15 (71 y.o. M) Primary Care Provider: Jilda [name] Other Clinician: Referring Provider: Treating Provider/Extender: [name] in Treatment: 52 Active Problems ICD-10 Encounter Code Description Active Date MDM Diagnosis 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 04/12/2021 No Yes I89.0 Lymphedema, not elsewhere classified 04/12/2021 No Yes I87.322 Chronic venous hypertension (idiopathic) with inflammation of left lower 04/12/2021 No Yes extremity L97.828 Non-pressure chronic ulcer of other part of left lower leg with other specified 04/12/2021 No Yes severity E11.621 Type 2 diabetes mellitus with foot ulcer 04/12/2021 No Yes L97.528 Non-pressure chronic ulcer of other part of left foot with other specified 04/12/2021 No Yes severity Inactive Problems ICD-10 Code Description Active Date Inactive Date E11.42 Type 2 diabetes mellitus with diabetic polyneuropathy 04/12/2021 04/12/2021 L02.416 Cutaneous abscess of left lower limb 06/13/2021 06/13/2021 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 05/11/2022 10:30 A M Medical Record Number: [id number] Patient Account Number: [id number] Date of Birth/Sex: Treating RN: Oct 28, 1950 (71 y.o. M) Primary Care Provider: Jilda [name] Other Clinician: Referring Provider: Treating Provider/Extender: [name] in Treatment: 56 Subjective Chief Complaint Information obtained from Patient Left leg and foot  ulcers 04/12/2021; patient is here for wounds on his left lower leg and left plantar foot over the first metatarsal head History of Present Illness (HPI) 10/11/17; Mr. Langenbach is a 71 year old man who tells me that in 2015 he slipped down the latter traumatizing his left leg. He developed a wound in the same spot the area that we are currently looking at. He states this closed over for the most part although he always felt it was somewhat unstable. In 2016 he hit the same area with the door of his car had this reopened. He tells me that this is never really closed although sometimes an inflow it remains open on a constant basis. He has not been using any specific dressing to this except for topical antibiotics the nature of which were not really sure. His primary doctor did send him to see Dr. [name] of interventional cardiology. He underwent an angiogram on 08/06/17 and he underwent a PTA and directional atherectomy of the lesser distal SFA and popliteal arteries which resulted in brisk improvement in blood flow. It was noted that he had 2 vessel runoff through the anterior tibial and peroneal. He is also been to see vascular and interventional radiologist. He was not felt to have any significant superficial venous insufficiency. Presumably is not a candidate for any ablation. It was suggested he come here for wound care. The patient is a type II diabetic on insulin. He also has a history of venous insufficiency. ABIs on the left were noncompressible in our clinic 10/21/17; patient we admitted to the clinic last week. 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 We put Iodosorb  on him after an aggressive debridement and 3 layer compression. He complained of pain in his ankle and itching with is skin in fact he scratched the area on the medial calf superiorly at the rim of our wraps and he has 2 small open areas in that location today which are  new. I changed his primary dressing today to silver collagen. As noted he is already had revascularization and does not have any significant superficial venous insufficiency that would be amenable to ablation 10/28/17; patient admitted to the clinic 2 weeks ago. He has a smaller Wound. Scratch injury from last week revealed. There is large wound over the tibial area. This is smaller. Granulation looks healthy. No need for debridement. 11/04/17; the wound on the left lateral calf looks better. Improved dimensions. Surface of this looks better. We've been maintaining him and Kerlix Coban wraps. He finds this much more comfortable. Silver collagen dressing 11/11/17; left lateral Wound continues to look healthy be making progress. Using a #5 curet I removed removed nonviable skin from the surface of the wound and then necrotic debris from the wound surface. Surface of the wound continues to look healthy. ooHe also has an open area on the left great toenail bed. We've been using topical antibiotics. 11/19/17; left anterior lateral wound continues to look healthy but it's not closed. ooHe also had a small wound above this on the left leg ooInitially traumatic wounds in the setting of significant chronic venous insufficiency and stasis dermatitis 11/25/17; left anterior wounds superiorly is closed still a small wound inferiorly. 12/02/17; left anterior tibial area. Arrives today with adherent callus. Post debridement clearly not completely closed. Hydrofera Blue under 3 layer compression. 12/09/17; left anterior tibia. Circumferential eschar however the wound bed looks stable to improved. We've been using Hydrofera Blue under 3 layer compression 12/17/17; left anterior tibia. Apparently this was felt to be closed however when the wrap was taken off there is a skin tear to reopen wounds in the same area we've been using Hydrofera Blue under 3 layer compression 12/23/17 left anterior tibia. Not close to close this  week apparently the [facility] was stuck to this again. Still circumferential eschar requiring debridement. I put a contact layer on this this time under the Hydrofera Blue 12/31/17; left anterior tibia. Wound is better slight amount of hyper-granulation. Using Hydrofera Blue over Adaptic. 01/07/18; left anterior tibia. The wound had some surface eschar however after this was removed he has no open wound.he was already revascularized by Dr. [name] when he came to our clinic with atherectomy of the left SFA and popliteal artery. He was also sent to interventional radiology for venous reflux studies. He was not felt to have significant reflux but certainly has chronic venous changes of his skin with hemosiderin deposition around this area. He will definitely need to lubricate his skin and wear compression stocking and I've talked to him about this. READMISSION 05/26/2018 This is a now 71 year old man we cared for with traumatic wounds on his left anterior lower extremity. He had been previously revascularized during that admission by Dr. [name]. Apparently in follow-up Dr. [name] noted that he had deterioration in his arterial status. He underwent a stent placement in the distal left SFA on 04/22/2018. 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 He went back to the angiography suite on 04/30/2018 he underwent PTA and balloon angioplasty of the occluded left mid anterior tibial artery, thrombotic occlusion went from 100 to 0% which reconstitutes the posterior tibial artery. He  had thrombectomy and aspiration of the peroneal artery. The stent placed in the distal SFA left SFA was still occluded. 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 [name], [name] ([id number]) [id number] Page 10 of 18 He has been recuperating at [facility] in [facility] after the most recent hospitalization. He is going home tomorrow. He tells me that 2 weeks ago he traumatized the tip of his left fifth toe. He came in urgently for our review of this. This was a history of before I noted that Dr. [name] had already noted dry gangrenous changes of the left fifth toe 06/09/2018; 2-week follow-up. I did contact Dr. [name] after his last appointment and he apparently saw 1 of Dr. Irven Shelling colleagues the next day. He does not follow-up with Dr. [name] himself until Thursday of this week. 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 Nevertheless there is no evidence of infection no drainage and no pain. He had a new area that this week when we were signing him in today on the left anterior mid tibia area, this is in close proximity to the previous wound we have dealt with in this clinic. 06/23/2018; 2-week follow-up. I did not receive a recent note from Dr. [name] to review today. Our office is trying to obtain this. He is apparently not planning to do further vascular interventions and wondered about compression to try and help with the patient's chronic venous insufficiency. However we are also concerned about the arterial flow. ooHe arrives in clinic today with a new area on the left third toe. The areas on the calf/anterior tibia are close to closing. The left fifth toe is still mummified using Betadine. -In reviewing things with the patient he has what sounds like claudication with mild to moderate amount of activity. 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 I prescribed Levaquin over the phone while we attempted to arrange a plan of care. However the patient called yesterday to report he had low-grade fever and he came in today acutely. There is been a marked deterioration in the left  third toe with spreading cellulitis up into the dorsal left foot. He was referred to the emergency room. Readmission: 06/29/2020 patient presents today for reevaluation here in our clinic he was previously treated by Dr. Dellia Nims at the latter part of 2019 in 2 the beginning of 2020. Subsequently we have not seen him since that time in the interim he did have evaluation with vein and vascular specialist specifically Dr. Anice Paganini who did perform quite extensive work for a left femoral to anterior tibial artery bypass. With that being said in the interim the patient has developed significant lymphedema and has wounds that he tells me have really never healed in regard to the incision site on the left leg. He also has multiple wounds on the feet for various reasons some of which is that he tends to pick at his feet. Fortunately there is no signs of active infection systemically at this time he does have some wounds that are little bit deeper but most are fairly superficial he seems to have good blood flow and overall everything appears to be healthy I  see no bone exposed and no obvious signs of osteomyelitis. I do not know that he necessarily needs a x-ray at this point although that something we could consider depending on how things progress. The patient does have a history of lymphedema, diabetes, this is type II, chronic kidney disease stage III, hypertension, and history of peripheral vascular disease. 07/05/2020; patient admitted last week. Is a patient I remember from 2019 he had a spreading infection involving the left foot and we sent him to the hospital. 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 He subsequently had a left femoral to anterior tibial bypass by Dr.Cain vein and vascular. He also has severe lymphedema with chronic skin changes related to that on the left leg. The most problematic area that was new today was on the left medial great toe. This  was apparently a small area last week there was purulent drainage which our intake nurse cultured. Also areas on the left medial foot and heel left lateral foot. He has 2 areas on the left medial calf left lateral calf in the setting of the severe lymphedema. 07/13/2020 on evaluation today patient appears to be doing better in my opinion compared to his last visit. The good news is there is no signs of active infection systemically and locally I do not see any signs of infection either. He did have an x-ray which was negative that is great news he had a culture which showed MRSA but at the same time he is been on the doxycycline which has helped. I do think we may want to extend this for 7 additional days 1/25; patient admitted to the clinic a few weeks ago. 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 We have been putting him under compression his edema control is a lot better but he is severe verricused skin on the left leg. He is really done quite well he still has an open area on the left medial calf and the left medial first metatarsal head. We have been using silver collagen on the leg silver alginate on the foot 07/27/2020 upon evaluation today patient appears to be doing decently well in regard to his wounds. He still has a lot of dry skin on the left leg. Some of this is starting to peel back and I think he may be able to have them out by removing some that today. Fortunately there is no signs of active infection at this time on the left leg although on the right leg he does appear to have swelling and erythema as well as some mild warmth to touch. This does have been concerned about the possibility of cellulitis although within the differential diagnosis I do think that potentially a DVT has to be at least considered. We need to rule that out before proceeding would just call in the cellulitis. Especially since he is having pain in the posterior aspect  of his calf muscle. 2/8; the patient had seen sparingly. He has severe skin changes of chronic lymphedema in the left leg thickened hyperkeratotic verrucous skin. He has an open wound on the medial part of the left first met head left mid tibia. He also has a rim of nonepithelialized skin in the anterior mid tibia. He brought in the AmLactin lotion that was been prescribed although I am not sure under compression and its utility. There concern about cellulitis on the right lower leg the last  time he was here. He was put on on antibiotics. His DVT rule out was negative. The right leg looks fine he is using his stocking on this area 08/10/2020 upon evaluation today patient appears to be doing well with regard to his leg currently. He has been tolerating the dressing changes without complication. Fortunately there is no signs of active infection which is great news. Overall very pleased with where things stand. 2/22; the patient still has an area on the medial part of the left first met his head. This looks better than when I last saw this earlier this month he has a rim of epithelialization but still some surface debris. Mostly everything on the left leg is healed. There is still a vulnerable in the left mid tibia area. 08/30/2020 upon evaluation today patient appears to be doing much better in regard to his wounds on his foot. 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 Nonetheless I think we will address that today I Minna send in a prescription for him in that regard. Overall though there does not appear to be any signs of significant worsening. 09/07/2020 on evaluation today patient's wounds over his left foot appear to be doing excellent. I do not see any signs of infection there is some callus buildup this can require debridement for certain but overall I feel like he  is managing quite nicely. He still using the AmLactin cream which has been beneficial for him as well. 3/22; left foot wound is closed. There is no open area here. He is using ammonium lactate lotion to the lower extremities to help exfoliate dry cracked skin. He has compression stockings from elastic therapy in . The wound on the medial part of his left first met head is healed today. READMISSION 04/12/2021 Mr. Peden is a patient we know fairly well he had a prolonged stay in clinic in 2019 with wounds on his left lateral and left anterior lower extremity in the setting of chronic venous insufficiency. More recently he was here earlier this year with predominantly an area on his left foot first metatarsal head plantar and he says the plantar foot broke down on its not long after we discharged him but he did not come back here. The last few months areas of broken down on his left anterior and again the left lateral lower extremity. The leg itself is very swollen chronically enlarged a lot of hyperkeratotic dry Berry Q skin in the left lower leg. His edema extends well into the thigh. He was seen by Dr. Donzetta Matters. He had ABIs on 03/02/2021 showing an ABI on the right of 1 with a TBI of 0.72 his ABI in the left at 1.09 TBI of 0.99. Monophasic and biphasic waveforms on the right. On the left monophasic waveforms were noted he went on to have an angiogram on 03/27/2021 this showed the aortic aortic and iliac segments were free of flow-limiting stenosis the left common femoral vein to evaluate the left femoral to anterior tibial artery bypass was unobstructed the bypass was patent without any areas of stenosis. We discharged the patient in bilateral juxta lite stockings but very clearly that was not sufficient to control the swelling and maintain skin integrity. He is clearly going to need compression pumps. 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  This is fortunate because he is on his feet for long periods of time. 10/27; patient comes in with our intake nurse reporting copious amount of green drainage from the left anterior mid tibia the left dorsal foot and to a lesser extent the left medial mid tibia. We left the compression wrap on all week for the amount of edema in his left leg is quite a bit better. We use silver alginate as [name], DULAY ([id number]) [id number] Page 11 of 18 the primary dressing 11/3; edema control is good. Left anterior lower leg left medial lower leg and the plantar first metatarsal head. The left anterior lower leg required debridement. Deep tissue culture I did of this wound showed MRSA I put him on 10 days of doxycycline which she will start today. We have him in compression wraps. He has a security card and [employer] however he is retiring on November 15. We will need to then get him into a better offloading boot for the left foot perhaps a total contact cast 11/10; edema control is quite good. Left anterior and left medial lower leg wounds in the setting of chronic venous insufficiency and lymphedema. He also has a substantial area over the left plantar first metatarsal head. 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 He has significant hypergranulation on the left plantar foot wound. The patient is a diabetic but he does not have significant PAD 11/17; edema control is quite good. Left anterior and left medial lower leg wounds look better. The really concerning area remains the area on the left plantar first metatarsal head. He has a rim of epithelialization. 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 Starting today with a forefoot off loader but . possibly a total contact cast. 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 Arrives with a very significant odor from I think all of the wounds on the left leg and the left foot. Because of this I did not put a total contact cast on him today but will could still consider this. His wife was having cataract surgery which is the reason he missed the appointment 12/6. I saw this man 5 days ago with a swelling below the popliteal fossa. I thought he actually might have a Baker's cyst however the DVT rule out study that we could arrange right away was negative the technician told me this was not a ruptured Baker's cyst. We attempted to get this aspirated by under ultrasound guidance in interventional radiology however all they did was an ultrasound however it shows an extensive fluid collection 62 x 8 x 9.4 in the left thigh and left calf. The patient states he thinks this started 8 days ago or so but he really is not complaining of any pain, fever or systemic symptoms. He has not ha 12/20; after some difficulty I managed to get the patient into see Dr. Donzetta Matters. Eventually he was taken into the hospital and had a drain put in the fluid collection below his left knee posteriorly extending into the posterior thigh. He still has the drain in place. Culture of this showed moderate staff  aureus few Morganella and few Klebsiella he is now on doxycycline and ciprofloxacin as suggested by infectious disease he is on this for a month. The drain will remain in place until it stops draining 12/29; he comes in today with the 1 wound on his left leg and the area on the left plantar first met head significantly smaller. Both look healthy. He still has the drain in the left leg. He says he has to change this daily. Follows up with Dr. Donzetta Matters on January 11. 06/29/2021; the wounds that I am following on the left leg and left first met head continued to be  quite healthy. However the area where his inferior drain is in place had copious amounts of drainage which was green in color. The wound here is larger. Follows up with Dr. Gwenlyn Saran of vein and vascular his surgeon next week as well as infectious disease. He remains on ciprofloxacin and doxycycline. He is not complaining of excessive pain in either one of the drain areas 1/12; the patient saw vascular surgery and infectious disease. Vascular surgery has left the drain in place as there was still some notable drainage still see him back in 2 weeks. Dr. Velna Ochs stop the doxycycline and ciprofloxacin and I do not believe he follows up with them at this point. Culture I did last week showed both doxycycline resistant MRSA and Pseudomonas not sensitive to ciprofloxacin although only in rare titers 1/19; the patient's wound on the left anterior lower leg is just about healed. We have continued healing of the area that was medially on the left leg. Left first plantar metatarsal head continues to get smaller. The major problem here is his 2 drain sites 1 on the left upper calf and lateral thigh. There is purulent drainage still from the left lateral thigh. I gave him antibiotics last week but we still have recultured. He has the drain in the area I think this is eventually going to have to come out. I suspect there will be a connecting wound to heal here perhaps with improved VAc 1/26; the patient had his drain removed by vein and vascular on 1/25/. This was a large pocket of fluid in his left thigh that seem to tunnel into his left upper calf. 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 His mention his Penrose drain was removed today. He now has a tunneling wound on his left calf and left thigh. Both of these probe widely towards each other although I cannot really prove that they connect. Both wounds on his lower leg anteriorly are closed and his area over the first metatarsal head on his right  foot continues to improve. We are using Hydrofera Blue here. He also saw infectious disease culture of the abscess they noted was polymicrobial with MRSA, Morganella and Klebsiella he was treated with doxycycline and ciprofloxacin for 4 weeks ending on 07/03/2021. They did not recommend any further antibiotics. Notable that while he still had the Penrose drain in place last week he had purulent drainage coming out of the inferior IandD site this grew [facility], MRSA and Pseudomonas but there does not appear to be any active infection in this area today with the drain out and he is not systemically unwell 2/2; with regards to the drain sites the superior one on the thigh actually is closed down the one on the upper left lateral calf measures about 8 and half centimeters which is an improvement seems to be less prominent although  still with a lot of drainage. The only remaining wound is over the first metatarsal head on the left foot and this looks to be continuing to improve with Hydrofera Blue. 2/9; the area on his plantar left foot continues to contract. Callus around the wound edge. The drain sites specifically have not come down in depth. We put the wound VAC on Monday he changed the canister late last night our intake nurse reported a pocket of fluid perhaps caused by our compression wraps 2/16; continued improvement in left foot plantar wound. drainage site in the calf is not improved in terms of depth (wound vac) 2/23; continued improvement in the left foot wound over the first metatarsal head. With regards to the drain sites the area on his thigh laterally is healed however the open area on his calf is small in terms of circumference by still probes in by about 15 cm. Within using the wound VAC. Hydrofera Blue on his foot 08/24/2021: The left first metatarsal head wound continues to improve. The wound bed is healthy with just some surrounding callus. Unfortunately the open drain site on his  calf remains open and tunnels at least 15 cm (the extent of a Q-tip). This is despite several weeks of wound VAC treatment. Based on reading back through the notes, there has been really no significant change in the depth of the wound, although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

## 2022-05-12 NOTE — Progress Notes (Signed)
DERRIOUS, BOLOGNA ([id number]) [id number] Page 1 of 11 Visit Report for 05/11/2022 Arrival Information Details Patient Name: Date of Service: [name], [name] 05/11/2022 10:30 A M Medical Record Number: [id number] Patient Account Number: [id number] Date of Birth/Sex: Treating RN: [birth year] (71 y.o. [name], [name] Primary Care Kyrollos Cordell: [name] Other Clinician: Referring Kamea Dacosta: Treating Yazen Rosko/Extender: [name] in Treatment: 30 Visit Information History Since Last Visit Added or deleted any medications: No Patient Arrived: Ambulatory Any new allergies or adverse reactions: No Arrival Time: 10:33 Had a fall or experienced change in No Accompanied By: self activities of daily living that may affect Transfer Assistance: None risk of falls: Patient Identification Verified: Yes Signs or symptoms of abuse/neglect since last visito No Secondary Verification Process Completed: Yes Hospitalized since last visit: No Patient Requires Transmission-Based Precautions: No Implantable device outside of the clinic excluding No Patient Has Alerts: Yes cellular tissue based products placed in the center since last visit: Has Dressing in Place as Prescribed: Yes Has Compression in Place as Prescribed: No Pain Present Now: No Electronic Signature(s) Signed: 05/11/2022 5:40:17 PM By: Baruch Gouty RN, BSN Entered By: Baruch Gouty on 05/11/2022 10:35:09 -------------------------------------------------------------------------------- Encounter Discharge Information Details Patient Name: Date of Service: [name]. 05/11/2022 10:30 A M Medical Record Number: [id number] Patient Account Number: [id number] Date of Birth/Sex: Treating RN: [birth year] (71 y.o. [name] Primary Care Eunice Winecoff: [name] Other Clinician: Referring Bibiana Gillean: Treating Leslea Vowles/Extender: [name] in Treatment:  16 Encounter Discharge Information Items Post Procedure Vitals Discharge Condition: Stable Temperature (F): 98.3 Ambulatory Status: Ambulatory Pulse (bpm): 62 Discharge Destination: Home Respiratory Rate (breaths/min): 18 Transportation: Private Auto Blood Pressure (mmHg): 126/76 Accompanied By: self Schedule Follow-up Appointment: Yes Clinical Summary of Care: Patient Declined Electronic Signature(s) Signed: 05/11/2022 5:40:17 PM By: Baruch Gouty RN, BSN Entered By: Baruch Gouty on 05/11/2022 11:46:45 [name] ([id number]) [id number] Page 2 of 11 -------------------------------------------------------------------------------- Lower Extremity Assessment Details Patient Name: Date of Service: [name], [name] 05/11/2022 10:30 A M Medical Record Number: [id number] Patient Account Number: [id number] Date of Birth/Sex: Treating RN: 09/17/1950 (71 y.o. [name] Primary Care [name]: [name] Other Clinician: Referring Mihran Lebarron: Treating Tildon Silveria/Extender: [name] in Treatment: 56 Edema Assessment Assessed: [name]: No] [Right: No] Edema: [Left: Ye] [Right: s] Calf Left: Right: Point of Measurement: 41 cm From Medial Instep 47 cm Ankle Left: Right: Point of Measurement: 10 cm From Medial Instep 29.3 cm Vascular Assessment Pulses: Dorsalis Pedis Palpable: [Left:Yes] Electronic Signature(s) Signed: 05/11/2022 5:40:17 PM By: Baruch Gouty RN, BSN Entered By: Baruch Gouty on 05/11/2022 10:44:16 -------------------------------------------------------------------------------- Multi Wound Chart Details Patient Name: Date of Service: [name]. 05/11/2022 10:30 A M Medical Record Number: [id number] Patient Account Number: [id number] Date of Birth/Sex: Treating RN: 1950/09/19 (71 y.o. M) Primary Care Capucine Tryon: [name] Other Clinician: Referring Elara Cocke: Treating Anysia Choi/Extender:  [name] in Treatment: 56 Vital Signs Height(in): 74 Capillary Blood Glucose(mg/dl): 169 Weight(lbs): 238 Pulse(bpm): 62 Body Mass Index(BMI): 30.6 Blood Pressure(mmHg): 126/76 Temperature(F): 98.3 Respiratory Rate(breaths/min): 18 [22:Photos:] [[id number] Page 3 of 11] Left, Lateral Lower Leg Left, Medial Foot Left, Anterior Lower Leg Wound Location: Bump Blister Pressure Injury Wounding Event: Cyst Diabetic Wound/Ulcer of the Lower Diabetic Wound/Ulcer of the Lower Primary Etiology: Extremity Extremity Glaucoma, Sleep Apnea, Glaucoma, Sleep Apnea, Glaucoma, Sleep Apnea, Comorbid History: Hypertension, Peripheral Arterial Hypertension, Peripheral Arterial Hypertension, Peripheral Arterial Disease, Peripheral Venous Disease, Disease, Peripheral Venous Disease, Disease,  Peripheral Venous Disease, Type II Diabetes, Gout, Osteoarthritis, Type II Diabetes, Gout, Osteoarthritis, Type II Diabetes, Gout, Osteoarthritis, Neuropathy Neuropathy Neuropathy 06/03/2021 02/01/2022 05/04/2022 Date Acquired: 40 14 1 Weeks of Treatment: Open Open Open Wound Status: No Yes No Wound Recurrence: Yes No No Clustered Wound: 3 N/A N/A Clustered Quantity: 3.7x1.8x0.1 2.3x3.6x0.1 2x1.3x0.1 Measurements L x W x D (cm) 5.231 6.503 2.042 A (cm) : rea 0.523 0.65 0.204 Volume (cm) : -217.20% -536.90% -550.30% % Reduction in A rea: 60.30% -537.30% -558.10% % Reduction in Volume: Full Thickness With Exposed Support Grade 1 Grade 1 Classification: Structures Medium Medium Medium Exudate A mount: Serous Serous Serous Exudate Type: amber amber amber Exudate Color: Fibrotic scar, thickened scar Distinct, outline attached Flat and Intact Wound Margin: Medium (34-66%) Large (67-100%) Large (67-100%) Granulation A mount: Pink Red Pink Granulation Quality: Medium (34-66%) Small (1-33%) None Present (0%) Necrotic A mount: Fat Layer  (Subcutaneous Tissue): Yes Fat Layer (Subcutaneous Tissue): Yes Fat Layer (Subcutaneous Tissue): Yes Exposed Structures: Fascia: No Fascia: No Fascia: No Tendon: No Tendon: No Tendon: No Muscle: No Muscle: No Joint: No Joint: No Joint: No Bone: No Bone: No Bone: No Small (1-33%) Medium (34-66%) Small (1-33%) Epithelialization: Debridement - Selective/Open Wound N/A N/A Debridement: Pre-procedure Verification/Time Out 11:00 N/A N/A Taken: Lidocaine 4% Topical Solution N/A N/A Pain Control: Necrotic/Eschar, Slough N/A N/A Tissue Debrided: Non-Viable Tissue N/A N/A Level: 6.66 N/A N/A Debridement A (sq cm): rea Curette N/A N/A Instrument: Minimum N/A N/A Bleeding: Pressure N/A N/A Hemostasis A chieved: 0 N/A N/A Procedural Pain: 0 N/A N/A Post Procedural Pain: Procedure was tolerated well N/A N/A Debridement Treatment Response: 3.7x1.8x0.1 N/A N/A Post Debridement Measurements L x W x D (cm) 0.523 N/A N/A Post Debridement Volume: (cm) Scarring: Yes Callus: Yes No Abnormalities Noted Periwound Skin Texture: Dry/Scaly: Yes Maceration: Yes Dry/Scaly: Yes Periwound Skin Moisture: Dry/Scaly: No Maceration: No Hemosiderin Staining: Yes No Abnormalities Noted Hemosiderin Staining: Yes Periwound Skin Color: No Abnormality No Abnormality No Abnormality Temperature: Yes N/A N/A Tenderness on Palpation: Debridement T Contact Cast otal N/A Procedures Performed: Treatment Notes Wound #22 (Lower Leg) Wound Laterality: Left, Lateral Cleanser Soap and Water Discharge Instruction: May shower and wash wound with dial antibacterial soap and water prior to dressing change. Wound Cleanser Discharge Instruction: Cleanse the wound with wound cleanser prior to applying a clean dressing using gauze sponges, not tissue or cotton balls. Peri-Wound Care Sween Lotion (Moisturizing lotion) Discharge Instruction: Apply moisturizing lotion to the leg Topical Skintegrity  Hydrogel 4 (oz) Discharge Instruction: Apply hydrogel as directed Primary Dressing Promogran Prisma Matrix, 4.34 (sq in) (silver collagen) Discharge Instruction: Moisten collagen with saline or hydrogel [name], [name] ([id number]) [id number] Page 4 of 11 Secondary Dressing ABD Pad, 8x10 Discharge Instruction: Apply over primary dressing as directed. Secured With Elastic Bandage 4 inch (ACE bandage) Discharge Instruction: Secure with ACE bandage as directed. Compression Wrap Compression Stockings Add-Ons Wound #27R (Foot) Wound Laterality: Left, Medial Cleanser Peri-Wound Care Topical Primary Dressing KerraCel Ag Gelling Fiber Dressing, 2x2 in (silver alginate) Discharge Instruction: Apply silver alginate to wound bed as instructed Secondary Dressing Optifoam Non-Adhesive Dressing, 4x4 in Discharge Instruction: Apply over primary dressing cut to make foam donut Secured With [place] [facility] T ape, 4 x 10 (in/yd) Discharge Instruction: Secure with tape as directed. Compression Wrap Compression Stockings Add-Ons Wound #28 (Lower Leg) Wound Laterality: Left, Anterior Cleanser Peri-Wound Care Topical Primary Dressing KerraCel Ag Gelling Fiber Dressing, 2x2 in (silver alginate) Discharge Instruction: Apply  silver alginate to wound bed as instructed Secondary Dressing Zetuvit Plus Silicone Border Dressing 4x4 (in/in) Discharge Instruction: Apply silicone border over primary dressing as directed. Secured With Compression Wrap Compression Stockings Environmental education officer) Signed: 05/11/2022 12:38:34 PM By: Fredirick Maudlin MD FACS Entered By: Fredirick Maudlin on 05/11/2022 12:38:34 [name] ([id number]) [id number] Page 5 of 11 -------------------------------------------------------------------------------- Multi-Disciplinary Care Plan Details Patient Name: Date of Service: [name], [name]  05/11/2022 10:30 A M Medical Record Number: [id number] Patient Account Number: [id number] Date of Birth/Sex: Treating RN: 07-02-1950 (71 y.o. [name] Primary Care Lilie Vezina: [name] Other Clinician: Referring Cyree Chuong: Treating Shiri Hodapp/Extender: [name] in Treatment: 55 Multidisciplinary Care Plan reviewed with physician Active Inactive Venous Leg Ulcer Nursing Diagnoses: Knowledge deficit related to disease process and management Potential for venous Insuffiency (use before diagnosis confirmed) Goals: Patient will maintain optimal edema control Date Initiated: 07/27/2021 Target Resolution Date: 06/07/2022 Goal Status: Active Interventions: Assess peripheral edema status every visit. Treatment Activities: Therapeutic compression applied : 07/27/2021 Notes: Wound/Skin Impairment Nursing Diagnoses: Impaired tissue integrity Knowledge deficit related to ulceration/compromised skin integrity Goals: Patient will have a decrease in wound volume by X% from date: (specify in notes) Date Initiated: 04/12/2021 Date Inactivated: 01/04/2022 Target Resolution Date: 04/23/2021 Goal Status: Met Patient/caregiver will verbalize understanding of skin care regimen Date Initiated: 01/04/2022 Target Resolution Date: 05/25/2022 Goal Status: Active Ulcer/skin breakdown will have a volume reduction of 30% by week 4 Date Initiated: 04/12/2021 Date Inactivated: 04/27/2021 Target Resolution Date: 04/27/2021 Goal Status: Unmet Unmet Reason: infection Ulcer/skin breakdown will have a volume reduction of 50% by week 8 Date Initiated: 04/27/2021 Date Inactivated: 06/29/2021 Target Resolution Date: 06/24/2021 Goal Status: Met Interventions: Assess patient/caregiver ability to obtain necessary supplies Assess patient/caregiver ability to perform ulcer/skin care regimen upon admission and as needed Assess ulceration(s) every visit Notes: Electronic  Signature(s) Signed: 05/11/2022 5:40:17 PM By: Baruch Gouty RN, BSN Entered By: Baruch Gouty on 05/11/2022 11:02:53 -------------------------------------------------------------------------------- Pain Assessment Details Patient Name: Date of Service: [name]. 05/11/2022 10:30 A Kathrin Penner ([id number]) [id number] Page 6 of 11 Medical Record Number: [id number] Patient Account Number: [id number] Date of Birth/Sex: Treating RN: Feb 17, 1951 (71 y.o. [name] Primary Care Clenton Esper: [name] Other Clinician: Referring Brooklin Rieger: Treating Aadarsh Cozort/Extender: [name] in Treatment: 3 Active Problems Location of Pain Severity and Description of Pain Patient Has Paino No Site Locations Rate the pain. Current Pain Level: 0 Pain Management and Medication Current Pain Management: Electronic Signature(s) Signed: 05/11/2022 5:40:17 PM By: Baruch Gouty RN, BSN Entered By: Baruch Gouty on 05/11/2022 10:42:54 -------------------------------------------------------------------------------- Patient/Caregiver Education Details Patient Name: Date of Service: [name] 11/17/2023andnbsp10:30 A M Medical Record Number: [id number] Patient Account Number: [id number] Date of Birth/Gender: Treating RN: 1950/12/06 (71 y.o. [name] Primary Care Physician: [name] Other Clinician: Referring Physician: Treating Physician/Extender: [name] in Treatment: 37 Education Assessment Education Provided To: Patient Education Topics Provided Elevated Blood Sugar/ Impact on Healing: Methods: Explain/Verbal Responses: Reinforcements needed, State content correctly Nutrition: Offloading: Methods: Explain/Verbal Responses: Reinforcements needed, State content correctly Venous: Methods: Explain/Verbal Responses: Reinforcements needed, State content correctly [name], [name]  ([id number]) [id number] Page 7 of 11 Wound/Skin Impairment: Methods: Explain/Verbal Responses: Reinforcements needed, State content correctly Electronic Signature(s) Signed: 05/11/2022 5:40:17 PM By: Baruch Gouty RN, BSN Entered By: Baruch Gouty on 05/11/2022 11:04:12 -------------------------------------------------------------------------------- Wound Assessment Details Patient Name: Date of Service: [name]. 05/11/2022 10:30 A M  Medical Record Number: [id number] Patient Account Number: [id number] Date of Birth/Sex: Treating RN: Apr 10, 1951 (71 y.o. [name] Primary Care Musa Rewerts: [name] Other Clinician: Referring Zeki Bedrosian: Treating Ari Bernabei/Extender: [name] in Treatment: 7 Wound Status Wound Number: 22 Primary Cyst Etiology: Wound Location: Left, Lateral Lower Leg Wound Open Wounding Event: Bump Status: Date Acquired: 06/03/2021 Comorbid Glaucoma, Sleep Apnea, Hypertension, Peripheral Arterial Disease, Weeks Of Treatment: 48 History: Peripheral Venous Disease, Type II Diabetes, Gout, Osteoarthritis, Clustered Wound: Yes Neuropathy Photos Wound Measurements Length: (cm) Width: (cm) Depth: (cm) Clustered Quantity: Area: (cm) Volume: (cm) 3.7 % Reduction in Area: -217.2% 1.8 % Reduction in Volume: 60.3% 0.1 Epithelialization: Small (1-33%) 3 Tunneling: No 5.231 Undermining: No 0.523 Wound Description Classification: Full Thickness With Exposed Support Structures Wound Margin: Fibrotic scar, thickened scar Exudate Amount: Medium Exudate Type: Serous Exudate Color: amber Foul Odor After Cleansing: No Slough/Fibrino No Wound Bed Granulation Amount: Medium (34-66%) Exposed Structure Granulation Quality: Pink Fascia Exposed: No Necrotic Amount: Medium (34-66%) Fat Layer (Subcutaneous Tissue) Exposed: Yes Necrotic Quality: Adherent Slough Tendon Exposed: No Muscle Exposed:  No Joint Exposed: No Bone Exposed: No [address] BLAYTON, HUTTNER ([id number]) [id number] Page 8 of 11 Texture Color No Abnormalities Noted: No No Abnormalities Noted: No Scarring: Yes Hemosiderin Staining: Yes Moisture Temperature / Pain No Abnormalities Noted: No Temperature: No Abnormality Dry / Scaly: Yes Tenderness on Palpation: Yes Treatment Notes Wound #22 (Lower Leg) Wound Laterality: Left, Lateral Cleanser Soap and Water Discharge Instruction: May shower and wash wound with dial antibacterial soap and water prior to dressing change. Wound Cleanser Discharge Instruction: Cleanse the wound with wound cleanser prior to applying a clean dressing using gauze sponges, not tissue or cotton balls. Peri-Wound Care Sween Lotion (Moisturizing lotion) Discharge Instruction: Apply moisturizing lotion to the leg Topical Skintegrity Hydrogel 4 (oz) Discharge Instruction: Apply hydrogel as directed Primary Dressing Promogran Prisma Matrix, 4.34 (sq in) (silver collagen) Discharge Instruction: Moisten collagen with saline or hydrogel Secondary Dressing ABD Pad, 8x10 Discharge Instruction: Apply over primary dressing as directed. Secured With Elastic Bandage 4 inch (ACE bandage) Discharge Instruction: Secure with ACE bandage as directed. Compression Wrap Compression Stockings Add-Ons Electronic Signature(s) Signed: 05/11/2022 5:40:17 PM By: Baruch Gouty RN, BSN Entered By: Baruch Gouty on 05/11/2022 10:53:15 -------------------------------------------------------------------------------- Wound Assessment Details Patient Name: Date of Service: [name]. 05/11/2022 10:30 A M Medical Record Number: [id number] Patient Account Number: [id number] Date of Birth/Sex: Treating RN: Feb 26, 1951 (71 y.o. [name] Primary Care Shareena Nusz: [name] Other Clinician: Referring Prescious Hurless: Treating Giovannie Scerbo/Extender: [name] in Treatment: 56 Wound Status Wound Number: [id number] Primary Diabetic Wound/Ulcer of the Lower Extremity Etiology: Wound Location: Left, Medial Foot Wound Open Wounding Event: Blister Status: Date Acquired: 02/01/2022 Comorbid Glaucoma, Sleep Apnea, Hypertension, Peripheral Arterial Disease, Weeks Of Treatment: 14 History: Peripheral Venous Disease, Type II Diabetes, Gout, Osteoarthritis, Clustered Wound: No Neuropathy Photos [name], [name] ([id number]) [id number] Page 9 of 11 Wound Measurements Length: (cm) 2.3 Width: (cm) 3.6 Depth: (cm) 0.1 Area: (cm) 6.503 Volume: (cm) 0.65 % Reduction in Area: -536.9% % Reduction in Volume: -537.3% Epithelialization: Medium (34-66%) Tunneling: No Undermining: No Wound Description Classification: Grade 1 Wound Margin: Distinct, outline attached Exudate Amount: Medium Exudate Type: Serous Exudate Color: amber Foul Odor After Cleansing: No Slough/Fibrino No Wound Bed Granulation Amount: Large (67-100%) Exposed Structure Granulation Quality: Red Fascia Exposed: No Necrotic Amount: Small (1-33%) Fat Layer (Subcutaneous Tissue) Exposed: Yes Tendon Exposed: No Muscle Exposed: No  Joint Exposed: No Bone Exposed: No Periwound Skin Texture Texture Color No Abnormalities Noted: No No Abnormalities Noted: Yes Callus: Yes Temperature / Pain Temperature: No Abnormality Moisture No Abnormalities Noted: No Dry / Scaly: No Maceration: Yes Treatment Notes Wound #27R (Foot) Wound Laterality: Left, Medial Cleanser Peri-Wound Care Topical Primary Dressing KerraCel Ag Gelling Fiber Dressing, 2x2 in (silver alginate) Discharge Instruction: Apply silver alginate to wound bed as instructed Secondary Dressing Optifoam Non-Adhesive Dressing, 4x4 in Discharge Instruction: Apply over primary dressing cut to make foam donut Secured With [place] [facility] T ape, 4 x 10  (in/yd) Discharge Instruction: Secure with tape as directed. Compression Wrap Compression Stockings Add-Ons Electronic Signature(s) Signed: 05/11/2022 5:40:17 PM By: Baruch Gouty RN, BSN [name] ([id number]) [id number] Page 10 of 11 Signed: 05/11/2022 5:40:17 PM By: Baruch Gouty RN, BSN Entered By: Baruch Gouty on 05/11/2022 10:53:44 -------------------------------------------------------------------------------- Wound Assessment Details Patient Name: Date of Service: [name]. 05/11/2022 10:30 A M Medical Record Number: [id number] Patient Account Number: [id number] Date of Birth/Sex: Treating RN: 1950-11-08 (71 y.o. [name] Primary Care Larenda Reedy: [name] Other Clinician: Referring Dalis Beers: Treating Quantae Martel/Extender: [name] in Treatment: 56 Wound Status Wound Number: 28 Primary Diabetic Wound/Ulcer of the Lower Extremity Etiology: Wound Location: Left, Anterior Lower Leg Wound Open Wounding Event: Pressure Injury Status: Date Acquired: 05/04/2022 Comorbid Glaucoma, Sleep Apnea, Hypertension, Peripheral Arterial Disease, Weeks Of Treatment: 1 History: Peripheral Venous Disease, Type II Diabetes, Gout, Osteoarthritis, Clustered Wound: No Neuropathy Photos Wound Measurements Length: (cm) 2 Width: (cm) 1.3 Depth: (cm) 0.1 Area: (cm) 2.042 Volume: (cm) 0.204 % Reduction in Area: -550.3% % Reduction in Volume: -558.1% Epithelialization: Small (1-33%) Tunneling: No Undermining: No Wound Description Classification: Grade 1 Wound Margin: Flat and Intact Exudate Amount: Medium Exudate Type: Serous Exudate Color: amber Foul Odor After Cleansing: No Slough/Fibrino Yes Wound Bed Granulation Amount: Large (67-100%) Exposed Structure Granulation Quality: Pink Fascia Exposed: No Necrotic Amount: None Present (0%) Fat Layer (Subcutaneous Tissue) Exposed: Yes Tendon Exposed:  No Joint Exposed: No Bone Exposed: No Periwound Skin Texture Texture Color No Abnormalities Noted: Yes No Abnormalities Noted: No Hemosiderin Staining: Yes Moisture No Abnormalities Noted: No Temperature / Pain Dry / Scaly: Yes Temperature: No Abnormality Maceration: No Treatment Notes [name], [name] ([id number]) [id number] Page 11 of 11 Wound #28 (Lower Leg) Wound Laterality: Left, Anterior Cleanser Peri-Wound Care Topical Primary Dressing KerraCel Ag Gelling Fiber Dressing, 2x2 in (silver alginate) Discharge Instruction: Apply silver alginate to wound bed as instructed Secondary Dressing Zetuvit Plus Silicone Border Dressing 4x4 (in/in) Discharge Instruction: Apply silicone border over primary dressing as directed. Secured With Compression Wrap Compression Stockings Environmental education officer) Signed: 05/11/2022 5:40:17 PM By: Baruch Gouty RN, BSN Entered By: Baruch Gouty on 05/11/2022 10:54:26 -------------------------------------------------------------------------------- Vitals Details Patient Name: Date of Service: [name]. 05/11/2022 10:30 A M Medical Record Number: [id number] Patient Account Number: [id number] Date of Birth/Sex: Treating RN: 02/16/1951 (71 y.o. [name] Primary Care Zacheriah Stumpe: [name] Other Clinician: Referring Yetzali Weld: Treating Yamilex Borgwardt/Extender: [name] in Treatment: 56 Vital Signs Time Taken: 10:41 Temperature (F): 98.3 Height (in): 74 Pulse (bpm): 62 Weight (lbs): 238 Respiratory Rate (breaths/min): 18 Body Mass Index (BMI): 30.6 Blood Pressure (mmHg): 126/76 Capillary Blood Glucose (mg/dl): 169 Reference Range: 80 - 120 mg / dl Notes glucose per pts meter at present Electronic Signature(s) Signed: 05/11/2022 5:40:17 PM By: Baruch Gouty RN, BSN Entered By: Baruch Gouty on 05/11/2022  10:42:42

## 2022-05-15 ENCOUNTER — Encounter (HOSPITAL_BASED_OUTPATIENT_CLINIC_OR_DEPARTMENT_OTHER): Payer: Medicare Other | Admitting: General Surgery

## 2022-05-15 DIAGNOSIS — I87332 Chronic venous hypertension (idiopathic) with ulcer and inflammation of left lower extremity: Secondary | ICD-10-CM | POA: Diagnosis not present

## 2022-05-16 NOTE — Progress Notes (Signed)
[name] ([id number]) [id number] Page 1 of 18 Visit Report for 05/15/2022 Chief Complaint Document Details Patient Name: Date of Service: [name], [name] 05/15/2022 10:30 A M Medical Record Number: [id number] Patient Account Number: [id number] Date of Birth/Sex: Treating RN: Jun 05, 1951 (71 y.o. M) Primary Care Provider: Jilda [name] Other Clinician: Referring Provider: Treating Provider/Extender: [name] in Treatment: 56 Information Obtained from: Patient Chief Complaint Left leg and foot ulcers 04/12/2021; patient is here for wounds on his left lower leg and left plantar foot over the first metatarsal head Electronic Signature(s) Signed: 05/15/2022 12:17:44 PM By: [name] Maudlin MD FACS Entered By: [name] on 05/15/2022 12:17:44 -------------------------------------------------------------------------------- Debridement Details Patient Name: Date of Service: [name]. 05/15/2022 10:30 A M Medical Record Number: [id number] Patient Account Number: [id number] Date of Birth/Sex: Treating RN: 1950-12-20 (71 y.o. [name] Primary Care Provider: Jilda [name] Other Clinician: Referring Provider: Treating Provider/Extender: [name] in Treatment: 56 Debridement Performed for Assessment: Wound #22 Left,Lateral Lower Leg Performed By: Physician [name] Maudlin, MD Debridement Type: Debridement Level of Consciousness (Pre-procedure): Awake and Alert Pre-procedure Verification/Time Out Yes - 11:05 Taken: Start Time: 11:08 Pain Control: Lidocaine 4% T opical Solution T Area Debrided (L x W): otal 5.6 (cm) x 1.8 (cm) = 10.08 (cm) Tissue and other material debrided: Non-Viable, Eschar, Slough, Slough Level: Non-Viable Tissue Debridement Description: Selective/Open Wound Instrument: Curette Bleeding: Minimum Hemostasis Achieved: Pressure Procedural Pain: 0 Post  Procedural Pain: 0 Response to Treatment: Procedure was tolerated well Level of Consciousness (Post- Awake and Alert procedure): Post Debridement Measurements of Total Wound Length: (cm) 5.6 Width: (cm) 1.8 Depth: (cm) 0.1 Volume: (cm) 0.792 Character of Wound/Ulcer Post Debridement: Improved Post Procedure Diagnosis [name] ([id number]) [id number] Page 2 of 18 Same as Pre-procedure Notes scribed by Baruch Gouty, RN for Dr. Celine Ahr Electronic Signature(s) Signed: 05/15/2022 12:59:49 PM By: [name] Maudlin MD FACS Signed: 05/16/2022 3:43:48 PM By: Baruch Gouty RN, BSN Entered By: Baruch Gouty on 05/15/2022 11:46:10 -------------------------------------------------------------------------------- HPI Details Patient Name: Date of Service: [name]. 05/15/2022 10:30 A M Medical Record Number: [id number] Patient Account Number: [id number] Date of Birth/Sex: Treating RN: [birth year] (71 y.o. M) Primary Care Provider: Jilda [name] Other Clinician: Referring Provider: Treating Provider/Extender: [name] in Treatment: 7 History of Present Illness HPI Description: 10/11/17; [name] is a 71 year old man who tells me that in 2015 he slipped down the latter traumatizing his left leg. He developed a wound in the same spot the area that we are currently looking at. He states this closed over for the most part although he always felt it was somewhat unstable. In 2016 he hit the same area with the door of his car had this reopened. He tells me that this is never really closed although sometimes an inflow it remains open on a constant basis. He has not been using any specific dressing to this except for topical antibiotics the nature of which were not really sure. His primary doctor did send him to see Dr. [name] of interventional cardiology. He underwent an angiogram on 08/06/17 and he underwent a PTA and  directional atherectomy of the lesser distal SFA and popliteal arteries which resulted in brisk improvement in blood flow. It was noted that he had 2 vessel runoff through the anterior tibial and peroneal. He is also been to see vascular and interventional radiologist. He was not felt to have any significant superficial venous insufficiency. Presumably is  not a candidate for any ablation. It was suggested he come here for wound care. The patient is a type II diabetic on insulin. He also has a history of venous insufficiency. ABIs on the left were noncompressible in our clinic 10/21/17; patient we admitted to the clinic last week. 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 We put Iodosorb on him after an aggressive debridement and 3 layer compression. He complained of pain in his ankle and itching with is skin in fact he scratched the area on the medial calf superiorly at the rim of our wraps and he has 2 small open areas in that location today which are new. I changed his primary dressing today to silver collagen. As noted he is already had revascularization and does not have any significant superficial venous insufficiency that would be amenable to ablation 10/28/17; patient admitted to the clinic 2 weeks ago. He has a smaller Wound. Scratch injury from last week revealed. There is large wound over the tibial area. This is smaller. Granulation looks healthy. No need for debridement. 11/04/17; the wound on the left lateral calf looks better. Improved dimensions. Surface of this looks better. We've been maintaining him and Kerlix Coban wraps. He finds this much more comfortable. Silver collagen dressing 11/11/17; left lateral Wound continues to look healthy be making progress. Using a #5 curet I removed removed nonviable skin from the surface of the wound and then necrotic debris from the wound surface. Surface of the wound continues to look  healthy. He also has an open area on the left great toenail bed. We've been using topical antibiotics. 11/19/17; left anterior lateral wound continues to look healthy but it's not closed. He also had a small wound above this on the left leg Initially traumatic wounds in the setting of significant chronic venous insufficiency and stasis dermatitis 11/25/17; left anterior wounds superiorly is closed still a small wound inferiorly. 12/02/17; left anterior tibial area. Arrives today with adherent callus. Post debridement clearly not completely closed. Hydrofera Blue under 3 layer compression. 12/09/17; left anterior tibia. Circumferential eschar however the wound bed looks stable to improved. We've been using Hydrofera Blue under 3 layer compression 12/17/17; left anterior tibia. Apparently this was felt to be closed however when the wrap was taken off there is a skin tear to reopen wounds in the same area we've been using Hydrofera Blue under 3 layer compression 12/23/17 left anterior tibia. Not close to close this week apparently the [facility] was stuck to this again. Still circumferential eschar requiring debridement. I put a contact layer on this this time under the Hydrofera Blue 12/31/17; left anterior tibia. Wound is better slight amount of hyper-granulation. Using Hydrofera Blue over Adaptic. 01/07/18; left anterior tibia. The wound had some surface eschar however after this was removed he has no open wound.he was already revascularized by Dr. [name] when he came to our clinic with atherectomy of the left SFA and popliteal artery. He was also sent to interventional radiology for venous reflux studies. He was not felt to have significant reflux but certainly has chronic venous changes of his skin with hemosiderin deposition around this area. He will definitely need to lubricate his skin and wear compression stocking and I've talked to him about this. READMISSION 05/26/2018 This is a now 71 year old  man we cared for with traumatic wounds on his left anterior lower extremity. He had been previously revascularized during that admission by  Dr. [name]. Apparently in follow-up Dr. [name] noted that he had deterioration in his arterial status. He underwent a stent placement in the distal left SFA on 04/22/2018. 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 He went back to the angiography suite on 04/30/2018 he underwent PTA and balloon angioplasty of the occluded left mid anterior tibial artery, thrombotic occlusion went from 100 to 0% which reconstitutes the posterior tibial artery. He had thrombectomy and aspiration of the peroneal artery. The stent placed in the distal SFA left SFA was still occluded. 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 DELANCE, [name] ([id number]) [id number] Page 3 of 18 He has been recuperating at [place] in [facility] after the most recent hospitalization. He is going home tomorrow. He tells me that 2 weeks ago he traumatized the tip of his left fifth toe. He came in urgently for our review of this. This was a history of before I noted that Dr. [name] had already noted dry gangrenous changes of the left fifth toe 06/09/2018; 2-week follow-up. I did contact Dr. [name] after his last appointment and he apparently saw 1 of Dr. Irven Shelling colleagues the next day. He does not follow-up with Dr. [name] himself until Thursday of this week. 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 Nevertheless there is no evidence of infection no drainage and no pain. He had a new area that this week when we were signing him in today on the left anterior mid tibia area, this is in  close proximity to the previous wound we have dealt with in this clinic. 06/23/2018; 2-week follow-up. I did not receive a recent note from Dr. [name] to review today. Our office is trying to obtain this. He is apparently not planning to [name] further vascular interventions and wondered about compression to try and help with the patient's chronic venous insufficiency. However we are also concerned about the arterial flow. He arrives in clinic today with a new area on the left third toe. The areas on the calf/anterior tibia are close to closing. The left fifth toe is still mummified using Betadine. -In reviewing things with the patient he has what sounds like claudication with mild to moderate amount of activity. 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 I prescribed Levaquin over the phone while we attempted to arrange a plan of care. However the patient called yesterday to report he had low-grade fever and he came in today acutely. There is been a marked deterioration in the left third toe with spreading cellulitis up into the dorsal left foot. He was referred to the emergency room. Readmission: 06/29/2020 patient presents today for reevaluation here in our clinic he was previously treated by Dr. Dellia Nims at the latter part of 2019 in 2 the beginning of 2020. Subsequently we have not seen him since that time in the interim he did have evaluation with vein and vascular specialist specifically Dr. Anice Paganini who did perform quite extensive work for a left femoral to anterior tibial artery bypass. With that being said in the interim the patient has developed significant lymphedema and has wounds that he tells me have really never healed in regard to the  incision site on the left leg. He also has multiple wounds on the feet for various reasons some of which is that he tends to pick at his feet. Fortunately there is no signs of active infection systemically at this time he does  have some wounds that are little bit deeper but most are fairly superficial he seems to have good blood flow and overall everything appears to be healthy I see no bone exposed and no obvious signs of osteomyelitis. I [name] not know that he necessarily needs a x-ray at this point although that something we could consider depending on how things progress. The patient does have a history of lymphedema, diabetes, this is type II, chronic kidney disease stage III, hypertension, and history of peripheral vascular disease. 07/05/2020; patient admitted last week. Is a patient I remember from 2019 he had a spreading infection involving the left foot and we sent him to the hospital. 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 He subsequently had a left femoral to anterior tibial bypass by Dr.Cain vein and vascular. He also has severe lymphedema with chronic skin changes related to that on the left leg. The most problematic area that was new today was on the left medial great toe. This was apparently a small area last week there was purulent drainage which our intake nurse cultured. Also areas on the left medial foot and heel left lateral foot. He has 2 areas on the left medial calf left lateral calf in the setting of the severe lymphedema. 07/13/2020 on evaluation today patient appears to be doing better in my opinion compared to his last visit. The good news is there is no signs of active infection systemically and locally I [name] not see any signs of infection either. He did have an x-ray which was negative that is great news he had a culture which showed MRSA but at the same time he is been on the doxycycline which has helped. I [name] think we may want to extend this for 7 additional days 1/25; patient admitted to the clinic a few weeks ago. 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 We have been putting him under compression his edema  control is a lot better but he is severe verricused skin on the left leg. He is really done quite well he still has an open area on the left medial calf and the left medial first metatarsal head. We have been using silver collagen on the leg silver alginate on the foot 07/27/2020 upon evaluation today patient appears to be doing decently well in regard to his wounds. He still has a lot of dry skin on the left leg. Some of this is starting to peel back and I think he may be able to have them out by removing some that today. Fortunately there is no signs of active infection at this time on the left leg although on the right leg he does appear to have swelling and erythema as well as some mild warmth to touch. This does have been concerned about the possibility of cellulitis although within the differential diagnosis I [name] think that potentially a DVT has to be at least considered. We need to rule that out before proceeding would just call in the cellulitis. Especially since he is having pain in the posterior aspect of his calf muscle. 2/8; the patient had seen sparingly. He has severe skin changes of chronic  lymphedema in the left leg thickened hyperkeratotic verrucous skin. He has an open wound on the medial part of the left first met head left mid tibia. He also has a rim of nonepithelialized skin in the anterior mid tibia. He brought in the AmLactin lotion that was been prescribed although I am not sure under compression and its utility. There concern about cellulitis on the right lower leg the last time he was here. He was put on on antibiotics. His DVT rule out was negative. The right leg looks fine he is using his stocking on this area 08/10/2020 upon evaluation today patient appears to be doing well with regard to his leg currently. He has been tolerating the dressing changes without complication. Fortunately there is no signs of active infection which is great news. Overall very pleased with where  things stand. 2/22; the patient still has an area on the medial part of the left first met his head. This looks better than when I last saw this earlier this month he has a rim of epithelialization but still some surface debris. Mostly everything on the left leg is healed. There is still a vulnerable in the left mid tibia area. 08/30/2020 upon evaluation today patient appears to be doing much better in regard to his wounds on his foot. 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 Nonetheless I think we will address that today I Minna send in a prescription for him in that regard. Overall though there does not appear to be any signs of significant worsening. 09/07/2020 on evaluation today patient's wounds over his left foot appear to be doing excellent. I [name] not see any signs of infection there is some callus buildup this can require debridement for certain but overall I feel like he is managing quite nicely. He still using the AmLactin cream which has been beneficial for him as well. 3/22; left foot wound is closed. There is no open area here. He is using ammonium lactate lotion to the lower extremities to help exfoliate dry cracked skin. He has compression stockings from elastic therapy in [place]. The wound on the medial part of his left first met head is healed today. READMISSION 04/12/2021 Mr. Strubel is a patient we know fairly well he had a prolonged stay in clinic in 2019 with wounds on his left lateral and left anterior lower extremity in the setting of chronic venous insufficiency. More recently he was here earlier this year with predominantly an area on his left foot first metatarsal head plantar and he says the plantar foot broke down on its not long after we discharged him but he did not come back here. The last few months areas of broken down on his  left anterior and again the left lateral lower extremity. The leg itself is very swollen chronically enlarged a lot of hyperkeratotic dry Berry Q skin in the left lower leg. His edema extends well into the thigh. He was seen by Dr. Donzetta Matters. He had ABIs on 03/02/2021 showing an ABI on the right of 1 with a TBI of 0.72 his ABI in the left at 1.09 TBI of 0.99. Monophasic and biphasic waveforms on the right. On the left monophasic waveforms were noted he went on to have an angiogram on 03/27/2021 this showed the aortic aortic and iliac segments were free of flow-limiting stenosis the left common femoral  vein to evaluate the left femoral to anterior tibial artery bypass was unobstructed the bypass was patent without any areas of stenosis. We discharged the patient in bilateral juxta lite stockings but very clearly that was not sufficient to control the swelling and maintain skin integrity. He is clearly going to need compression pumps. 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 This is fortunate because he is on his feet for long periods of time. 10/27; patient comes in with our intake nurse reporting copious amount of green drainage from the left anterior mid tibia the left dorsal foot and to a lesser extent the left medial mid tibia. We left the compression wrap on all week for the amount of edema in his left leg is quite a bit better. We use silver alginate as DIONNE, ROSSA ([id number]) [id number] Page 4 of 18 the primary dressing 11/3; edema control is good. Left anterior lower leg left medial lower leg and the plantar first metatarsal head. The left anterior lower leg required debridement. Deep tissue culture I did of this wound showed MRSA I put him on 10 days of doxycycline which she will start today. We have him in compression wraps. He has a security card and [employer] however he is retiring on November 15. We will need to  then get him into a better offloading boot for the left foot perhaps a total contact cast 11/10; edema control is quite good. Left anterior and left medial lower leg wounds in the setting of chronic venous insufficiency and lymphedema. He also has a substantial area over the left plantar first metatarsal head. 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 He has significant hypergranulation on the left plantar foot wound. The patient is a diabetic but he does not have significant PAD 11/17; edema control is quite good. Left anterior and left medial lower leg wounds look better. The really concerning area remains the area on the left plantar first metatarsal head. He has a rim of epithelialization. 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 Starting today with a forefoot off loader but . possibly a total contact cast. 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 Arrives with a very significant odor from I think all of the wounds on the left leg and the left foot. Because of this I did not put a total contact cast on him today but will could still consider this. His wife was having cataract surgery which is the reason he missed the appointment 12/6. I saw this man 5 days ago with a swelling below the popliteal fossa. I thought he actually might have a Baker's cyst however the DVT rule out study that we could arrange right away was negative the technician told me this was not a ruptured Baker's cyst. We attempted to get this aspirated by under ultrasound guidance in interventional radiology however all they did was an ultrasound however it shows an extensive fluid collection 62 x 8 x 9.4 in the left  thigh and left calf. The patient states he thinks this started 8 days ago  or so but he really is not complaining of any pain, fever or systemic symptoms. He has not ha 12/20; after some difficulty I managed to get the patient into see Dr. Donzetta Matters. Eventually he was taken into the hospital and had a drain put in the fluid collection below his left knee posteriorly extending into the posterior thigh. He still has the drain in place. Culture of this showed moderate staff aureus few Morganella and few Klebsiella he is now on doxycycline and ciprofloxacin as suggested by infectious disease he is on this for a month. The drain will remain in place until it stops draining 12/29; he comes in today with the 1 wound on his left leg and the area on the left plantar first met head significantly smaller. Both look healthy. He still has the drain in the left leg. He says he has to change this daily. Follows up with Dr. Donzetta Matters on January 11. 06/29/2021; the wounds that I am following on the left leg and left first met head continued to be quite healthy. However the area where his inferior drain is in place had copious amounts of drainage which was green in color. The wound here is larger. Follows up with Dr. Gwenlyn Saran of vein and vascular his surgeon next week as well as infectious disease. He remains on ciprofloxacin and doxycycline. He is not complaining of excessive pain in either one of the drain areas 1/12; the patient saw vascular surgery and infectious disease. Vascular surgery has left the drain in place as there was still some notable drainage still see him back in 2 weeks. Dr. Velna Ochs stop the doxycycline and ciprofloxacin and I [name] not believe he follows up with them at this point. Culture I did last week showed both doxycycline resistant MRSA and Pseudomonas not sensitive to ciprofloxacin although only in rare titers 1/19; the patient's wound on the left anterior lower leg is just about healed. We have continued  healing of the area that was medially on the left leg. Left first plantar metatarsal head continues to get smaller. The major problem here is his 2 drain sites 1 on the left upper calf and lateral thigh. There is purulent drainage still from the left lateral thigh. I gave him antibiotics last week but we still have recultured. He has the drain in the area I think this is eventually going to have to come out. I suspect there will be a connecting wound to heal here perhaps with improved VAc 1/26; the patient had his drain removed by vein and vascular on 1/25/. This was a large pocket of fluid in his left thigh that seem to tunnel into his left upper calf. 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 His mention his Penrose drain was removed today. He now has a tunneling wound on his left calf and left thigh. Both of these probe widely towards each other although I cannot really prove that they connect. Both wounds on his lower leg anteriorly are closed and his area over the first metatarsal head on his right foot continues to improve. We are using Hydrofera Blue here. He also saw infectious disease culture of the abscess they noted was polymicrobial with MRSA, Morganella and Klebsiella he was treated with doxycycline and ciprofloxacin for 4 weeks ending on 07/03/2021. They did not recommend any further antibiotics. Notable that while he still had the Penrose drain in place last week he had purulent drainage coming out of the inferior IandD site this  grew [place] [facility], MRSA and Pseudomonas but there does not appear to be any active infection in this area today with the drain out and he is not systemically unwell 2/2; with regards to the drain sites the superior one on the thigh actually is closed down the one on the upper left lateral calf measures about 8 and half centimeters which is an improvement seems to be less prominent although still with a lot of drainage. The only remaining wound  is over the first metatarsal head on the left foot and this looks to be continuing to improve with Hydrofera Blue. 2/9; the area on his plantar left foot continues to contract. Callus around the wound edge. The drain sites specifically have not come down in depth. We put the wound VAC on Monday he changed the canister late last night our intake nurse reported a pocket of fluid perhaps caused by our compression wraps 2/16; continued improvement in left foot plantar wound. drainage site in the calf is not improved in terms of depth (wound vac) 2/23; continued improvement in the left foot wound over the first metatarsal head. With regards to the drain sites the area on his thigh laterally is healed however the open area on his calf is small in terms of circumference by still probes in by about 15 cm. Within using the wound VAC. Hydrofera Blue on his foot 08/24/2021: The left first metatarsal head wound continues to improve. The wound bed is healthy with just some surrounding callus. Unfortunately the open drain site on his calf remains open and tunnels at least 15 cm (the extent of a Q-tip). This is despite several weeks of wound VAC treatment. Based on reading back through the notes, there has been really no significant change in the depth of the wound, although the orifice is smaller and the more cranial wound on his thigh has closed. I suspect the tunnel tracks nearly all the way to this location. 08/31/2021: Continued improvement in the left first metatarsal head wound. There has been absolutely no improvement to the long tunnel from his open drain site on his calf. We have tried to get him into see vascular surgery sooner to consider the possibility of simply filleting the tract open and allowing it to heal from the bottom up, likely with a wound VAC. They have not yet scheduled a sooner appointment than his current mid April 09/14/2021: He was seen by vascular surgery and they took him to the operating  room last week. They opened a portion of the tunnel, but did not extend the entire length of the known open subcutaneous tract. I read Dr. Claretha Cooper operative note and it is not clear from that documentation why only a portion of the tract was opened. The heaped up granulation tissue was curetted and removed from at least some portion of the tract. They did place a wound VAC and applied an Unna boot to the leg. The ulcer on his left first metatarsal head is smaller today. The bed looks good and there is just a small amount of surrounding callus. 09/21/2021: The ulcer on his left first metatarsal head looks to be stalled. There is some callus surrounding the wound but the wound bed itself does not appear particularly dynamic. The tunnel tract on his lateral left leg seems to be roughly the same length or perhaps slightly smaller but the wound bed appears healthy with good granulation tissue. He opened up a new wound on his medial thigh and the site of  a prior surgical incision. He says that he did this unconsciously in his sleep by scratching. 09/28/2021: Unfortunately, the ulcer on his left first metatarsal head has extended underneath the callus toward the dorsum of his foot. The medial thigh wounds are roughly the same. The tunnel on his lateral left leg continues to be problematic; it is longer than we are able to actually probe with a Q-tip. I am still not [name], [name] ([id number]) [id number] Page 5 of 18 certain as to why Dr. Donzetta Matters did not open this up entirely when he took the patient to the operating room. We will likely be back in the same situation with just a small superficial opening in a long unhealed tract, as the open portion is granulating in nicely. 10/02/2021: The patient was initially scheduled for a nurse visit, but we are also applying a total contact cast today. The plantar foot wound looks clean without significant accumulated callus. We have been applying  Prisma silver collagen to the site. 10/05/2021: The patient is here for his first total contact cast change. We have tried using gauze packing strips in the tunnel on his lateral leg wound, but this does not seem to be working any better than the white VAC foam. The foot ulcer looks about the same with minimal periwound callus. Medial thigh wound is clean with just some overlying eschar. 10/12/2021: The plantar foot wound is stable without any significant accumulation of periwound callus. The surface is viable with good granulation tissue. The medial thigh wounds are much smaller and are epithelializing. On the other hand, he had purulent drainage coming from the tunnel on his lateral leg. He does go back to see Dr. Donzetta Matters next week and is planning to ask him why the wound tunnel was not completely opened at the time of his most recent operation. 10/19/2021: The plantar foot wound is markedly improved and has epithelial tissue coming through the surface. The medial thigh wounds are nearly closed with just a tiny open area. He did see Dr. Donzetta Matters earlier this week and apparently they did discuss the possibility of opening the sinus tract further and enabling a wound VAC application. Apparently there are some limits as to what Dr. 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 I think if we could get the tract open to the level of the popliteal fossa, this would greatly aid in her ability to get this chart closed. That being said, however, today when I probed the tract with a Q-tip, I was not able to insert the entirety of the Q-tip as I have on previous occasions. The tunnel is shorter by about 4 cm. The surface is clean with good granulation tissue and no further episodes of purulent drainage. 10/30/2021: Last week, the patient underwent surgery and had the long tract in his leg opened. There was a rind that was debrided, according to the operative report. His medial thigh ulcers  are closed. The plantar foot wound is clean with a good surface and some built up surrounding callus. 11/06/2021: The overall dimensions of the large wound on his lateral leg remain about the same, but there is good granulation tissue present and the tunneling is a little bit shorter. He has a new wound on his anterior tibial surface, in the same location where he had a similar lesion in the past. The plantar foot wound is clean with some buildup surrounding callus. Just toward the medial aspect of his foot, however, there is an  area of darkening that once debrided, revealed another opening in the skin surface. 11/13/2021: The anterior tibial surface wound is closed. The plantar foot wound has some surrounding callus buildup. The area of darkening that I debrided last week and revealed an opening in the skin surface has closed again. The tunnel in the large wound on his lateral leg has come in by about 3 cm. There is healthy granulation tissue on the entire wound surface. 11/23/2021: The patient was out of town last week and did wet-to-dry dressings on his large wound. 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 Unfortunately, he picked open the wound on his left medial thigh. He says that it was itching and he just could not stop scratching it until it was open again. The wound on his plantar foot is smaller and has not accumulated a tremendous amount of callus. The lateral leg wound is shallower and the tunnel has also decreased in depth. There is just a little bit of slough accumulation on the surface. 11/30/2021: Another portion of his left medial thigh has been opened up. All of these wounds are fairly superficial with just a little bit of slough and eschar accumulation. The wound on his plantar foot is almost closed with just a bit of eschar and periwound callus accumulation. The lateral leg wound is nearly flush with the surrounding  skin and the tunnel is markedly shallower. 12/07/2021: There is just 1 open area on his left medial thigh. It is clean with just a little bit of perimeter eschar. The wound on his plantar foot continues to contract and just has some eschar and periwound callus accumulation. The lateral leg wound is closing at the more distal aspect and the tunnel is smaller. The surface is nearly flush with the surrounding skin and it has a good bed of granulation tissue. 12/14/2021: The thigh and foot wounds are closed. The lateral leg wound has closed over approximately half of its length. The tunnel continues to contract and the surface is now flush with the surrounding skin. The wound bed has robust granulation tissue. 12/22/2021: The thigh and foot wounds have reopened. The foot wound has a lot of callus accumulation around and over it. The thigh wound is tiny with just a little bit of slough in the wound bed. The lateral leg wound continues to contract. His vascular surgeon took the wound VAC off earlier in the week and the patient has been doing wet-to-dry dressings. There is a little slough accumulation on the surface. The tunnel is about 3 cm in depth at this point. 12/28/2021: The thigh wound is closed again. The foot wound has some callus that subsequently has peeled back exposing just a small slit of a wound. The lateral leg wound Is down to about half the size that it originally was and the tunnel is down to about half a centimeter in depth. 01/04/2022: The thigh wound remains closed. The foot wound has heavy callus overlying the wound site. Once this was debrided, the wound was found to be closed. The lateral leg wound is smaller again this week and very superficial. No tunnel could be identified. 01/12/2022: The thigh and foot wounds both remain closed. The lateral leg wound is now nearly flush with the skin surface. There is good granulation tissue present with a light layer of slough. 01/19/2022: Due to the  way his wrap was placed, the patient did not change the dressing  on his thigh at all and so the foam was saturated and his skin is macerated. There is a light layer of slough on the wound surface. The underlying granulation tissue is robust and healthy-appearing. He has heavy callus buildup at the site of his first metatarsal head wound which is still healed. 02/01/2022: He has been in silver alginate. When he removed the dressing from his thigh wound, however, some leg, superficially reopening a portion of the wound that had healed. In addition, underneath the callus at his left first metatarsal head, there appears to be a blister and the wound appears to be open again. 02/08/2022: The lateral leg wound has contracted substantially. There is eschar and a light layer of slough present. He says that it is starting to pull and is uncomfortable. On inspection, there is some puckering of the scar and the eschar is quite dry; this may account for his symptoms. On his first metatarsal head, the wound is much smaller with just some eschar on the surface. The callus has not reaccumulated. He reports that he had a blister come up on his medial thigh wound at the distal aspect. It popped and there is now an opening in his skin again. Looking back through his [place] of wound photos, there is what looks like a permanent suture just deep to this location and it may be trying to erode through. We have been using silver alginate on his wounds. 02/15/2022: The lateral leg wound is about half the size it was last week. It is clean with just a little perimeter eschar and light slough. The wound on his first metatarsal head is about the same with heavy callus overlying it. The medial thigh wound is closed again. He does have some skin changes on the top of his foot that looks potentially yeast related. 02/22/2022: The skin on the top of his foot improved with the use of a topical antifungal. The lateral leg wound continues  to contract and is again smaller this week. There is a little bit of slough and eschar on the surface. The first metatarsal head wound is a little bit smaller but has reaccumulated a thick callus over the top. He decided to try to trim his toenail and ultimately took the entire nail off of his left great toe. 03/02/2022: His lateral leg wound continues to improve, as does the wound on his left great toe. Unfortunately, it appears that somehow his foot got wet and moisture seeped in through the opening causing his skin to lift. There is a large wound now overlying his first metatarsal on both the plantar, medial, and dorsal portion of his foot. There is necrotic tissue and slough present underneath the shaggy macerated skin. 03/08/2022: The lateral leg wound is smaller again today. There is just a light layer of slough and eschar on the surface. The great toe wound is smaller again today. The first metatarsal wound is a little bit smaller today and does not look nearly as necrotic and macerated. There is still slough and nonviable tissue present. 03/15/2022: The lateral leg wound is narrower and just has a little bit of light slough buildup. The first metatarsal wound still has a fair amount of moisture [name], [name] ([id number]) [id number] Page 6 of 18 affecting the periwound skin. The great toe wound is healed. 03/22/2022: The lateral leg wound is now isolated to just at the level of his knee. There is some eschar and slough accumulation. The first metatarsal head wound has epithelialized  tremendously and is about half the size that it was last week. He still has some maceration on the top of his foot and a fungal odor is present. 03/29/2022: T oday the patient's foot was macerated, suggesting that the cast got wet. The patient has also been picking at his dry skin and has enlarged the wound on his left lateral leg. In the time between having his cast removed and my  evaluation, he had picked more dry skin and opened up additional wounds on his Achilles area and dorsal foot. The plantar first metatarsal head wound, however, is smaller and clean with just macerated callus around the perimeter and light slough on the surface. The lateral leg wound measured a little bit larger but is also fairly clean with eschar and minimal slough. 04/02/2022: The patient had vascular studies done last Friday and so his cast was not applied. He is here today to have that done. 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 04/05/2022: Both wounds are smaller and quite clean. There is just a little biofilm on the lateral leg wound. 10/20; the patient has a wound on the left lateral surgical incision at the level of his lateral knee this looks clean and improved. He is using silver alginate. He also has an area on his left medial foot for which she is using Hydrofera Blue under a total contact cast both wounds are measuring smaller 04/20/2022: The plantar foot wound has contracted considerably and is very close to closing. The lateral leg wound was measured a little larger, but there was a tiny open area that was included in the measurements that was not included last week. He has some eschar around the perimeter but otherwise the wound looks clean. 04/27/2022: The lateral leg wound looks better this week. He says that midweek, he felt it was very dry and began applying hydrogel to the site. I think this was beneficial. The foot wound is nearly closed underneath a thick layer of dry skin and callus. 05/04/2022: The foot wound is healed. He has developed a new small ulcer on his anterior tibial surface about midway up his leg. It has a little slough on the surface. The lateral leg wound still is fairly dry, but clean with just a little biofilm on the surface. 05/11/2022: The wound on his foot reopened on Wednesday. A large blister formed which then broke open revealing the fat  layer underneath. The ulcer on his anterior tibial surface is a little bit larger this week. The lateral leg wound has much better moisture balance this week. Fortunately, prior to his foot wound reopening, he did get the cast made for his orthotic. 05/15/2022: Already, the left medial foot wound has improved. The tissue is less macerated and the surface is clean. The ulcer on his anterior tibial surface continues to enlarge. This seems likely secondary to accumulated moisture. The lateral leg wound continues to have an improved moisture balance with the use of collagen. Electronic Signature(s) Signed: 05/15/2022 12:20:12 PM By: [name] Maudlin MD FACS Entered By: [name] on 05/15/2022 12:20:12 -------------------------------------------------------------------------------- Physical Exam Details Patient Name: Date of Service: [name]. 05/15/2022 10:30 A M Medical Record Number: [id number] Patient Account Number: [id number] Date of Birth/Sex: Treating RN: 1950-09-17 (71 y.o. M) Primary Care Provider: Jilda [name] Other Clinician: Referring Provider: Treating Provider/Extender: [name] in Treatment: 52 Constitutional Slightly hypertensive. Bradycardic, asymptomatic.. . . No acute distress. Respiratory Normal work of breathing on room air. Notes  05/15/2022: Already, the left medial foot wound has improved. The tissue is less macerated and the surface is clean. The ulcer on his anterior tibial surface continues to enlarge. This seems likely secondary to accumulated moisture. The lateral leg wound continues to have an improved moisture balance with the use of collagen. Electronic Signature(s) Signed: 05/15/2022 12:20:50 PM By: [name] Maudlin MD FACS Entered By: [name] on 05/15/2022 12:20:50 [name] ([id number]) [id number] Page 7 of  18 -------------------------------------------------------------------------------- Physician Orders Details Patient Name: Date of Service: [name], [name] 05/15/2022 10:30 A M Medical Record Number: [id number] Patient Account Number: [id number] Date of Birth/Sex: Treating RN: 04/19/1951 (71 y.o. 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 - Dr Celine Ahr - Room 1 Return A Tuesday 12/1 @ 10:30 am Anesthetic Wound #22 Left,Lateral Lower Leg (In clinic) Topical Lidocaine 4% applied to wound bed Bathing/ Shower/ Hygiene May shower with protection but [name] not get wound dressing(s) wet. - Use a cast protector so you can shower without getting your cast wet Edema Control - Lymphedema / SCD / Other Elevate legs to the level of the heart or above for 30 minutes daily and/or when sitting, a frequency of: - throughout the day Avoid standing for long periods of time. Patient to wear own compression stockings every day. - on right leg; Moisturize legs daily. Compression stocking or Garment 20-30 mm/Hg pressure to: - left leg daily Off-Loading Total Contact Cast to Left Lower Extremity Other: - minimal weight bearing left foot Wound Treatment Wound #22 - Lower Leg Wound Laterality: Left,  Lateral Cleanser: Soap and Water 3 x Per Week/30 Days Discharge Instructions: May shower and wash wound with dial antibacterial soap and water prior to dressing change. Cleanser: Wound Cleanser 3 x Per Week/30 Days Discharge Instructions: Cleanse the wound with wound cleanser prior to applying a clean dressing using gauze sponges, not tissue or cotton balls. Peri-Wound Care: Sween Lotion (Moisturizing lotion) 3 x Per Week/30 Days Discharge Instructions: Apply moisturizing lotion to the leg Topical: Skintegrity Hydrogel 4 (oz) 3 x Per Week/30 Days Discharge Instructions: Apply hydrogel as directed Prim Dressing: Promogran Prisma Matrix, 4.34 (sq in) (silver collagen) 3 x Per Week/30 Days ary Discharge Instructions: Moisten collagen with saline or hydrogel Secondary Dressing: ABD Pad, 8x10 (Generic) 3 x Per Week/30 Days Discharge Instructions: Apply over primary dressing as directed. Secured With: Elastic Bandage 4 inch (ACE bandage) 3 x Per Week/30 Days Discharge Instructions: Secure with ACE bandage as directed. Wound #27R - Foot Wound Laterality: Left, Medial Prim Dressing: KerraCel Ag Gelling Fiber Dressing, 2x2 in (silver alginate) 1 x Per Week ary Discharge Instructions: Apply silver alginate to wound bed as instructed Secondary Dressing: Optifoam Non-Adhesive Dressing, 4x4 in 1 x Per Week Discharge Instructions: Apply over primary dressing cut to make foam donut Secondary Dressing: Woven Gauze Sponge, Non-Sterile 4x4 in 1 x Per Ceasar Lund ([id number]) [id number] Page 8 of 18 Discharge Instructions: Apply  over primary dressing as directed. Secured With: 57M Medipore H Soft Cloth Surgical T ape, 4 x 10 (in/yd) 1 x Per Week Discharge Instructions: Secure with tape as directed. Wound #28 - Lower Leg Wound Laterality: Left, Anterior Prim Dressing: KerraCel Ag Gelling Fiber Dressing, 2x2 in (silver alginate) 1 x Per Week ary Discharge Instructions:  Apply silver alginate to wound bed as instructed Secondary Dressing: Woven Gauze Sponge, Non-Sterile 4x4 in 1 x Per Week Discharge Instructions: Apply over primary dressing as directed. Electronic Signature(s) Signed: 05/15/2022 12:59:49 PM By: [name] Maudlin MD FACS Entered By: [name] on 05/15/2022 12:21:04 -------------------------------------------------------------------------------- Problem List Details Patient Name: Date of Service: [name]. 05/15/2022 10:30 A M Medical Record Number: [id number] Patient Account Number: [id number] Date of Birth/Sex: Treating RN: 1951-04-15 (71 y.o. M) Primary Care Provider: Jilda [name] Other Clinician: Referring Provider: Treating Provider/Extender: [name] in Treatment: 91 Active Problems ICD-10 Encounter Code Description Active Date MDM Diagnosis E11.621 Type 2 diabetes mellitus with foot ulcer 04/12/2021 No Yes 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 04/12/2021 No Yes I89.0 Lymphedema, not elsewhere classified 04/12/2021 No Yes I87.322 Chronic venous hypertension (idiopathic) with inflammation of left lower 04/12/2021 No Yes extremity L97.828 Non-pressure chronic ulcer of other part of left lower leg with other specified 04/12/2021 No Yes severity L97.528 Non-pressure chronic ulcer of other part of left foot with other specified 04/12/2021 No Yes severity Inactive Problems ICD-10 Code Description Active Date Inactive Date E11.42 Type 2 diabetes mellitus with diabetic polyneuropathy 04/12/2021 04/12/2021 [name] ([id number]) [id number] Page 9 of 18 L02.416 Cutaneous abscess of left lower limb 06/13/2021 06/13/2021 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 05/15/2022 10:30 A M Medical Record Number: [id number] Patient Account Number: [id number] Date of Birth/Sex: Treating RN: 01-Nov-1950 (71 y.o. M) Primary Care Provider: Jilda [name] Other Clinician: Referring Provider: Treating Provider/Extender: [name] in Treatment: 56 Subjective Chief Complaint Information obtained from Patient Left leg and foot ulcers 04/12/2021; patient is here for wounds on his left lower leg and left plantar foot over the first metatarsal head History of Present Illness (HPI) 10/11/17; Mr. Hartwell is a 71 year old man who tells me that in 2015 he slipped down the latter traumatizing his left leg. He developed a wound in the same spot the area that we are currently looking at. He states this closed over for the most part although he always felt it was somewhat unstable. In 2016 he hit the same area with the door of his car had this reopened. He tells me that this is never really closed although sometimes an inflow it remains open on a constant basis. He has not been using any specific dressing to this except for topical antibiotics the nature of which were not really sure. His primary doctor did send him to see Dr. [name] of interventional cardiology. He underwent an angiogram on 08/06/17 and he underwent a PTA and directional atherectomy of the lesser distal SFA and popliteal arteries which resulted in brisk improvement in blood flow. It was noted that he had 2 vessel runoff through the anterior tibial and peroneal. He is also been to see vascular and interventional radiologist. He was not felt to have any significant superficial  venous insufficiency. Presumably is not a candidate for any ablation. It was suggested he come here for wound  care. The patient is a type II diabetic on insulin. He also has a history of venous insufficiency. ABIs on the left were noncompressible in our clinic 10/21/17; patient we admitted to the clinic last week. 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 We put Iodosorb on him after an aggressive debridement and 3 layer compression. He complained of pain in his ankle and itching with is skin in fact he scratched the area on the medial calf superiorly at the rim of our wraps and he has 2 small open areas in that location today which are new. I changed his primary dressing today to silver collagen. As noted he is already had revascularization and does not have any significant superficial venous insufficiency that would be amenable to ablation 10/28/17; patient admitted to the clinic 2 weeks ago. He has a smaller Wound. Scratch injury from last week revealed. There is large wound over the tibial area. This is smaller. Granulation looks healthy. No need for debridement. 11/04/17; the wound on the left lateral calf looks better. Improved dimensions. Surface of this looks better. We've been maintaining him and Kerlix Coban wraps. He finds this much more comfortable. Silver collagen dressing 11/11/17; left lateral Wound continues to look healthy be making progress. Using a #5 curet I removed removed nonviable skin from the surface of the wound and then necrotic debris from the wound surface. Surface of the wound continues to look healthy. ooHe also has an open area on the left great toenail bed. We've been using topical antibiotics. 11/19/17; left anterior lateral wound continues to look healthy but it's not closed. ooHe also had a small wound above this on the left leg ooInitially traumatic wounds in the setting of significant chronic venous insufficiency and stasis dermatitis 11/25/17; left anterior wounds superiorly is closed still a small wound  inferiorly. 12/02/17; left anterior tibial area. Arrives today with adherent callus. Post debridement clearly not completely closed. Hydrofera Blue under 3 layer compression. 12/09/17; left anterior tibia. Circumferential eschar however the wound bed looks stable to improved. We've been using Hydrofera Blue under 3 layer compression 12/17/17; left anterior tibia. Apparently this was felt to be closed however when the wrap was taken off there is a skin tear to reopen wounds in the same area we've been using Hydrofera Blue under 3 layer compression 12/23/17 left anterior tibia. Not close to close this week apparently the [facility] was stuck to this again. Still circumferential eschar requiring debridement. I put a contact layer on this this time under the Hydrofera Blue 12/31/17; left anterior tibia. Wound is better slight amount of hyper-granulation. Using Hydrofera Blue over Adaptic. 01/07/18; left anterior tibia. The wound had some surface eschar however after this was removed he has no open wound.he was already revascularized by Dr. [name] when he came to our clinic with atherectomy of the left SFA and popliteal artery. He was also sent to interventional radiology for venous reflux studies. He was not felt to have significant reflux but certainly has chronic venous changes of his skin with hemosiderin deposition around this area. He will definitely need to lubricate his skin and wear compression stocking and I've talked to him about this. READMISSION 05/26/2018 [name] ([id number]) [id number] Page 10 of 18 This is a now 71 year old man we cared for with traumatic wounds on his  left anterior lower extremity. He had been previously revascularized during that admission by Dr. [name]. Apparently in follow-up Dr. [name] noted that he had deterioration in his arterial status. He underwent a stent placement in the distal left SFA on 04/22/2018. 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 He went back to the angiography suite on 04/30/2018 he underwent PTA and balloon angioplasty of the occluded left mid anterior tibial artery, thrombotic occlusion went from 100 to 0% which reconstitutes the posterior tibial artery. He had thrombectomy and aspiration of the peroneal artery. The stent placed in the distal SFA left SFA was still occluded. 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 He has been recuperating at [place] home in [facility] after the most recent hospitalization. He is going home tomorrow. He tells me that 2 weeks ago he traumatized the tip of his left fifth toe. He came in urgently for our review of this. This was a history of before I noted that Dr. [name] had already noted dry gangrenous changes of the left fifth toe 06/09/2018; 2-week follow-up. I did contact Dr. [name] after his last appointment and he apparently saw 1 of Dr. Irven Shelling colleagues the next day. He does not follow-up with Dr. [name] himself until Thursday of this week. 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 Nevertheless there is no evidence of infection no drainage and no pain. He had a new area that this week when we were signing him in today on the left anterior mid tibia area, this is in close proximity to the previous wound we have dealt with in this clinic. 06/23/2018; 2-week follow-up. I did not receive a recent note from Dr. [name] to review today. Our office is trying to obtain this. He is apparently not planning to [name] further vascular interventions and wondered about compression to try and help with the patient's chronic venous insufficiency. However we are also concerned about the arterial flow. ooHe  arrives in clinic today with a new area on the left third toe. The areas on the calf/anterior tibia are close to closing. The left fifth toe is still mummified using Betadine. -In reviewing things with the patient he has what sounds like claudication with mild to moderate amount of activity. 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 I prescribed Levaquin over the phone while we attempted to arrange a plan of care. However the patient called yesterday to report he had low-grade fever and he came in today acutely. There is been a marked deterioration in the left third toe with spreading cellulitis up into the dorsal left foot. He was referred to the emergency room. Readmission: 06/29/2020 patient presents today for reevaluation here in our clinic he was previously treated by Dr. Dellia Nims at the latter part of 2019 in 2 the beginning of 2020. Subsequently we have not seen him since that time in the interim he did have evaluation with vein and vascular specialist specifically Dr. Anice Paganini who did perform quite extensive work for a left femoral to anterior tibial artery bypass. With that being said in the interim the patient has developed significant lymphedema and has wounds that he tells me have really never  healed in regard to the incision site on the left leg. He also has multiple wounds on the feet for various reasons some of which is that he tends to pick at his feet. Fortunately there is no signs of active infection systemically at this time he does have some wounds that are little bit deeper but most are fairly superficial he seems to have good blood flow and overall everything appears to be healthy I see no bone exposed and no obvious signs of osteomyelitis. I [name] not know that he necessarily needs a x-ray at this point although that something we could consider depending on how things progress. The patient does have a history of lymphedema, diabetes, this is type II,  chronic kidney disease stage III, hypertension, and history of peripheral vascular disease. 07/05/2020; patient admitted last week. Is a patient I remember from 2019 he had a spreading infection involving the left foot and we sent him to the hospital. 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 He subsequently had a left femoral to anterior tibial bypass by Dr.Cain vein and vascular. He also has severe lymphedema with chronic skin changes related to that on the left leg. The most problematic area that was new today was on the left medial great toe. This was apparently a small area last week there was purulent drainage which our intake nurse cultured. Also areas on the left medial foot and heel left lateral foot. He has 2 areas on the left medial calf left lateral calf in the setting of the severe lymphedema. 07/13/2020 on evaluation today patient appears to be doing better in my opinion compared to his last visit. The good news is there is no signs of active infection systemically and locally I [name] not see any signs of infection either. He did have an x-ray which was negative that is great news he had a culture which showed MRSA but at the same time he is been on the doxycycline which has helped. I [name] think we may want to extend this for 7 additional days 1/25; patient admitted to the clinic a few weeks ago. 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 We have been putting him under compression his edema control is a lot better but he is severe verricused skin on the left leg. He is really done quite well he still has an open area on the left medial calf and the left medial first metatarsal head. We have been using silver collagen on the leg silver alginate on the foot 07/27/2020 upon evaluation today patient appears to be doing decently well in regard to his wounds. He still has a lot of dry skin on the left leg. Some of this  is starting to peel back and I think he may be able to have them out by removing some that today. Fortunately there is no signs of active infection at this time on the left leg although on the right leg he does appear to have swelling and erythema as well as some mild warmth to touch. This does have been concerned about the possibility of cellulitis although within the differential diagnosis I [name] think that potentially a DVT has to be at least considered. We need to rule that out before proceeding would just call in the cellulitis. Especially since he is having pain in the posterior aspect of his calf muscle. 2/8; the patient had seen sparingly. He has  severe skin changes of chronic lymphedema in the left leg thickened hyperkeratotic verrucous skin. He has an open wound on the medial part of the left first met head left mid tibia. He also has a rim of nonepithelialized skin in the anterior mid tibia. He brought in the AmLactin lotion that was been prescribed although I am not sure under compression and its utility. There concern about cellulitis on the right lower leg the last time he was here. He was put on on antibiotics. His DVT rule out was negative. The right leg looks fine he is using his stocking on this area 08/10/2020 upon evaluation today patient appears to be doing well with regard to his leg currently. He has been tolerating the dressing changes without complication. Fortunately there is no signs of active infection which is great news. Overall very pleased with where things stand. 2/22; the patient still has an area on the medial part of the left first met his head. This looks better than when I last saw this earlier this month he has a rim of epithelialization but still some surface debris. Mostly everything on the left leg is healed. There is still a vulnerable in the left mid tibia area. 08/30/2020 upon evaluation today patient appears to be doing much better in regard to his wounds on his  foot. 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 Nonetheless I think we will address that today I Minna send in a prescription for him in that regard. Overall though there does not appear to be any signs of significant worsening. 09/07/2020 on evaluation today patient's wounds over his left foot appear to be doing excellent. I [name] not see any signs of infection there is some callus buildup this can require debridement for certain but overall I feel like he is managing quite nicely. He still using the AmLactin cream which has been beneficial for him as well. 3/22; left foot wound is closed. There is no open area here. He is using ammonium lactate lotion to the lower extremities to help exfoliate dry cracked skin. He has compression stockings from elastic therapy in [place]. The wound on the medial part of his left first met head is healed today. READMISSION 04/12/2021 Mr. Lomanto is a patient we know fairly well he had a prolonged stay in clinic in 2019 with wounds on his left lateral and left anterior lower extremity in the setting of chronic venous insufficiency. More recently he was here earlier this year with predominantly an area on his left foot first metatarsal head plantar and he says the plantar foot broke down on its not long after we discharged him but he did not come back here. The last few months areas of broken down on his left anterior and again the left lateral lower extremity. The leg itself is very swollen chronically enlarged a lot of hyperkeratotic dry Berry Q skin in the left lower leg. His edema extends well into the thigh. He was seen by Dr. Donzetta Matters. He had ABIs on 03/02/2021 showing an ABI on the right of 1 with a TBI of 0.72 his ABI in the left at 1.09 TBI of 0.99. Monophasic and biphasic waveforms on the right. On the left monophasic waveforms were  noted he went on to have an angiogram on 03/27/2021 this showed the aortic aortic and iliac segments were free of flow-limiting  stenosis the left common femoral vein to evaluate the left femoral to anterior tibial artery bypass was JEPTHA, HINNENKAMP ([id number]) [phone].pdf Page 11 of 18 unobstructed the bypass was patent without any areas of stenosis. We discharged the patient in bilateral juxta lite stockings but very clearly that was not sufficient to control the swelling and maintain skin integrity. He is clearly going to need compression pumps. 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 This is fortunate because he is on his feet for long periods of time. 10/27; patient comes in with our intake nurse reporting copious amount of green drainage from the left anterior mid tibia the left dorsal foot and to a lesser extent the left medial mid tibia. We left the compression wrap on all week for the amount of edema in his left leg is quite a bit better. We use silver alginate as the primary dressing 11/3; edema control is good. Left anterior lower leg left medial lower leg and the plantar first metatarsal head. The left anterior lower leg required debridement. Deep tissue culture I did of this wound showed MRSA I put him on 10 days of doxycycline which she will start today. We have him in compression wraps. He has a security card and [employer] however he is retiring on November 15. We will need to then get him into a better offloading boot for the left foot perhaps a total contact cast 11/10; edema control is quite good. Left anterior and left medial lower leg wounds in the setting of chronic venous insufficiency and lymphedema. He also has a substantial area over the left plantar first metatarsal head. I treated him for MRSA that we identified on the major wound on the left anterior mid tibia with doxycycline and gentamicin  topically. He has significant hypergranulation on the left plantar foot wound. The patient is a diabetic but he does not have significant PAD 11/17; edema control is quite good. Left anterior and left medial lower leg wounds look better. The really concerning area remains the area on the left plantar first metatarsal head. He has a rim of epithelialization. 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 Starting today with a forefoot off loader but . possibly a total contact cast. 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 Arrives with a very significant odor from I think all of the wounds on the left leg and the left foot. Because of this I did not put a total contact cast on him today but will could still consider this. His wife was having cataract surgery which is the reason he missed the appointment 12/6. I saw this man 5 days ago with a swelling below the popliteal fossa. I thought he actually might have a Baker's cyst however the DVT rule out study that we could arrange right away was negative the technician told me this was not a ruptured Baker's cyst. We attempted to get this aspirated by under ultrasound guidance in interventional radiology however all they did was an ultrasound however it shows an extensive fluid collection 62 x 8 x 9.4 in the left thigh and left calf. The patient states he thinks this  started 8 days ago or so but he really is not complaining of any pain, fever or systemic symptoms. He has not ha 12/20; after some difficulty I managed to get the patient into see Dr. Donzetta Matters. Eventually he was taken into the hospital and had a drain put in the fluid collection below his left knee posteriorly extending into the posterior thigh. He still has the drain in place.  Culture of this showed moderate staff aureus few Morganella and few Klebsiella he is now on doxycycline and ciprofloxacin as suggested by infectious disease he is on this for a month. The drain will remain in place until it stops draining 12/29; he comes in today with the 1 wound on his left leg and the area on the left plantar first met head significantly smaller. Both look healthy. He still has the drain in the left leg. He says he has to change this daily. Follows up with Dr. Donzetta Matters on January 11. 06/29/2021; the wounds that I am following on the left leg and left first met head continued to be quite healthy. However the area where his inferior drain is in place had copious amounts of drainage which was green in color. The wound here is larger. Follows up with Dr. Gwenlyn Saran of vein and vascular his surgeon next week as well as infectious disease. He remains on ciprofloxacin and doxycycline. He is not complaining of excessive pain in either one of the drain areas 1/12; the patient saw vascular surgery and infectious disease. Vascular surgery has left the drain in place as there was still some notable drainage still see him back in 2 weeks. Dr. Velna Ochs stop the doxycycline and ciprofloxacin and I [name] not believe he follows up with them at this point. Culture I did last week showed both doxycycline resistant MRSA and Pseudomonas not sensitive to ciprofloxacin although only in rare titers 1/19; the patient's wound on the left anterior lower leg is just about healed. We have continued healing of the area that was medially on the left leg. Left first plantar metatarsal head continues to get smaller. The major problem here is his 2 drain sites 1 on the left upper calf and lateral thigh. There is purulent drainage still from the left lateral thigh. I gave him antibiotics last week but we still have recultured. He has the drain in the area I think this is eventually going to have to come out. I suspect there will be  a connecting wound to heal here perhaps with improved VAc 1/26; the patient had his drain removed by vein and vascular on 1/25/. This was a large pocket of fluid in his left thigh that seem to tunnel into his left upper calf. 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 His mention his Penrose drain was removed today. He now has a tunneling wound on his left calf and left thigh. Both of these probe widely towards each other although I cannot really prove that they connect. Both wounds on his lower leg anteriorly are closed and his area over the first metatarsal head on his right foot continues to improve. We are using Hydrofera Blue here. He also saw infectious disease culture of the abscess they noted was polymicrobial with MRSA, Morganella and Klebsiella he was treated with doxycycline and ciprofloxacin for 4 weeks ending on 07/03/2021. They did not recommend any further antibiotics. Notable that while he still had the Penrose drain in place last week he had purulent drainage coming out of  the inferior IandD site this grew [facility], MRSA and Pseudomonas but there does not appear to be any active infection in this area today with the drain out and he is not systemically unwell 2/2; with regards to the drain sites the superior one on the thigh actually is closed down the one on the upper left lateral calf measures about 8 and half centimeters which is an improvement seems to be less prominent although still with a lot of drainage. The only remaining wound is over the first metatarsal head on the left foot and this looks to be continuing to improve with Hydrofera Blue. 2/9; the area on his plantar left foot continues to contract. Callus around the wound edge. The drain sites specifically have not come down in depth. We put the wound VAC on Monday he changed the canister late last night our intake nurse reported a pocket of fluid perhaps caused by our compression wraps 2/16;  continued improvement in left foot plantar wound. drainage site in the calf is not improved in terms of depth (wound vac) 2/23; continued improvement in the left foot wound over the first metatarsal head. With regards to the drain sites the area on his thigh laterally is healed however the open area on his calf is small in terms of circumference by still probes in by about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

## 2022-05-21 NOTE — Progress Notes (Signed)
[name] ([id number]) [id number] Page 1 of 11 Visit Report for 05/15/2022 Arrival Information Details Patient Name: Date of Service: [name], [name] 05/15/2022 10:30 A M Medical Record Number: [id number] Patient Account Number: [id number] Date of Birth/Sex: Treating RN: Aug 12, 1950 (71 y.o. [name] Primary Care Destin Vinsant: [name] Other Clinician: Referring Francoise Chojnowski: Treating Azka Steger/Extender: [name] in Treatment: 86 Visit Information History Since Last Visit Added or deleted any medications: No Patient Arrived: Ambulatory Any new allergies or adverse reactions: No Arrival Time: 10:39 Had a fall or experienced change in No Accompanied By: self activities of daily living that may affect Transfer Assistance: None risk of falls: Patient Requires Transmission-Based Precautions: No Signs or symptoms of abuse/neglect since last No Patient Has Alerts: Yes visito Hospitalized since last visit: No Implantable device outside of the clinic No excluding cellular tissue based products placed in the center since last visit: Has Dressing in Place as Prescribed: Yes Has Footwear/Offloading in Place as Yes Prescribed: Left: Removable Cast Walker/Walking Boot Pain Present Now: No Electronic Signature(s) Signed: 05/21/2022 4:55:50 PM By: Blanche East RN Entered By: Blanche East on 05/15/2022 10:40:08 -------------------------------------------------------------------------------- Encounter Discharge Information Details Patient Name: Date of Service: [name]. 05/15/2022 10:30 A M Medical Record Number: [id number] Patient Account Number: [id number] Date of Birth/Sex: Treating RN: [birth year] (71 y.o. [name] Primary Care Vansh Reckart: [name] Other Clinician: Referring Aailyah Dunbar: Treating Satoshi Kalas/Extender: [name] in Treatment: 34 Encounter Discharge Information Items  Post Procedure Vitals Discharge Condition: Stable Temperature (F): 98.2 Ambulatory Status: Ambulatory Pulse (bpm): 53 Discharge Destination: Home Respiratory Rate (breaths/min): 18 Transportation: Private Auto Blood Pressure (mmHg): 148/77 Accompanied By: self Schedule Follow-up Appointment: Yes Clinical Summary of Care: Patient Declined Electronic Signature(s) Signed: 05/16/2022 3:43:48 PM By: Baruch Gouty RN, BSN Entered By: Baruch Gouty on 05/15/2022 11:47:32 [name] ([id number]) [id number] Page 2 of 11 -------------------------------------------------------------------------------- Lower Extremity Assessment Details Patient Name: Date of Service: [name] 05/15/2022 10:30 A M Medical Record Number: [id number] Patient Account Number: [id number] Date of Birth/Sex: Treating RN: [birth year] (71 y.o. [name] Primary Care Doneta Bayman: [name] Other Clinician: Referring Coltrane Tugwell: Treating Cherine Drumgoole/Extender: [name] in Treatment: 56 Edema Assessment Assessed: [name]: No] [Right: No] Edema: [Left: Ye] [Right: s] Calf Left: Right: Point of Measurement: 41 cm From Medial Instep 46 cm Ankle Left: Right: Point of Measurement: 10 cm From Medial Instep 29 cm Vascular Assessment Pulses: Dorsalis Pedis Palpable: [Left:Yes] Electronic Signature(s) Signed: 05/21/2022 4:55:50 PM By: Blanche East RN Entered By: Blanche East on 05/15/2022 10:50:00 -------------------------------------------------------------------------------- Multi Wound Chart Details Patient Name: Date of Service: [name]. 05/15/2022 10:30 A M Medical Record Number: [id number] Patient Account Number: [id number] Date of Birth/Sex: Treating RN: 06/12/1951 (71 y.o. 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/Ulcer of the Lower Primary Etiology: Extremity Extremity Glaucoma, Sleep Apnea, Glaucoma, Sleep Apnea, Glaucoma, Sleep Apnea, Comorbid History: Hypertension, Peripheral Arterial Hypertension, Peripheral Arterial Hypertension, Peripheral Arterial Disease, Peripheral Venous Disease, Disease, Peripheral Venous Disease, Disease, Peripheral Venous Disease, Type II Diabetes,  Gout, Osteoarthritis, Type II Diabetes, Gout, Osteoarthritis, Type II Diabetes, Gout, Osteoarthritis, Neuropathy Neuropathy Neuropathy 06/03/2021 02/01/2022 05/04/2022 Date Acquired: 50 14 1 Weeks of Treatment: Open Open Open Wound Status: No Yes No Wound Recurrence: Yes No No Clustered Wound: 3 N/A N/A Clustered Quantity: 5.6x1.8x0.1 1x1.3x0.1 1.8x1.6x0.1 Measurements L x W x D (cm) 7.917 1.021 2.262 A (cm) : rea 0.792 0.102 0.226 Volume (cm) : -380.10% 0.00% -620.40% % Reduction in A rea: 40.00% 0.00% -629.00% % Reduction in Volume: Full Thickness With Exposed Support Grade 1 Grade 1 Classification: Structures Medium Medium Medium Exudate A mount: Serous Serous Serous Exudate Type: amber amber amber Exudate Color: Fibrotic scar, thickened scar Distinct, outline attached Flat and Intact Wound Margin: Medium (34-66%) Large (67-100%) Large (67-100%) Granulation A mount: Pink Red Pink Granulation Quality: Medium (34-66%) Small (1-33%) None Present (0%) Necrotic A mount: Fat Layer (Subcutaneous Tissue): Yes Fat Layer (Subcutaneous Tissue): Yes Fat  Layer (Subcutaneous Tissue): Yes Exposed Structures: Fascia: No Fascia: No Fascia: No Tendon: No Tendon: No Tendon: No Muscle: No Muscle: No Joint: No Joint: No Joint: No Bone: No Bone: No Bone: No Small (1-33%) Medium (34-66%) Small (1-33%) Epithelialization: Debridement - Selective/Open Wound N/A N/A Debridement: Pre-procedure Verification/Time Out 11:05 N/A N/A Taken: Lidocaine 4% Topical Solution N/A N/A Pain Control: Necrotic/Eschar, Slough N/A N/A Tissue Debrided: Non-Viable Tissue N/A N/A Level: 10.08 N/A N/A Debridement A (sq cm): rea Curette N/A N/A Instrument: Minimum N/A N/A Bleeding: Pressure N/A N/A Hemostasis A chieved: 0 N/A N/A Procedural Pain: 0 N/A N/A Post Procedural Pain: Procedure was tolerated well N/A N/A Debridement Treatment Response: 5.6x1.8x0.1 N/A N/A Post Debridement Measurements L x W x D (cm) 0.792 N/A N/A Post Debridement Volume: (cm) Scarring: Yes Callus: Yes No Abnormalities Noted Periwound Skin Texture: Dry/Scaly: Yes Maceration: Yes Dry/Scaly: Yes Periwound Skin Moisture: Dry/Scaly: No Maceration: No Hemosiderin Staining: Yes No Abnormalities Noted Hemosiderin Staining: Yes Periwound Skin Color: No Abnormality No Abnormality No Abnormality Temperature: Yes N/A N/A Tenderness on Palpation: Debridement T Contact Cast otal N/A Procedures Performed: Treatment Notes Wound #22 (Lower Leg) Wound Laterality: Left, Lateral Cleanser Soap and Water Discharge Instruction: May shower and wash wound with dial antibacterial soap and water prior to dressing change. Wound Cleanser Discharge Instruction: Cleanse the wound with wound cleanser prior to applying a clean dressing using gauze sponges, not tissue or cotton balls. Peri-Wound Care Sween Lotion (Moisturizing lotion) Discharge Instruction: Apply moisturizing lotion to the leg Topical Skintegrity Hydrogel 4 (oz) Discharge Instruction: Apply hydrogel as  directed Primary Dressing Promogran Prisma Matrix, 4.34 (sq in) (silver collagen) MACIEJ, SCHWEITZER ([id number]) [id number] Page 4 of 11 Discharge Instruction: Moisten collagen with saline or hydrogel Secondary Dressing ABD Pad, 8x10 Discharge Instruction: Apply over primary dressing as directed. Secured With Elastic Bandage 4 inch (ACE bandage) Discharge Instruction: Secure with ACE bandage as directed. Compression Wrap Compression Stockings Add-Ons Wound #27R (Foot) Wound Laterality: Left, Medial Cleanser Peri-Wound Care Topical Primary Dressing KerraCel Ag Gelling Fiber Dressing, 2x2 in (silver alginate) Discharge Instruction: Apply silver alginate to wound bed as instructed Secondary Dressing Optifoam Non-Adhesive Dressing, 4x4 in Discharge Instruction: Apply over primary dressing cut to make foam donut Woven Gauze Sponge, Non-Sterile 4x4 in Discharge Instruction: Apply over primary dressing as directed. Secured With 41M Medipore H Soft Cloth Surgical T ape, 4 x 10 (in/yd) Discharge Instruction: Secure with tape as directed. Compression Wrap Compression Stockings Add-Ons Wound #28 (Lower Leg) Wound Laterality: Left, Anterior Cleanser Peri-Wound Care Topical Primary Dressing KerraCel Ag Gelling Fiber  Dressing, 2x2 in (silver alginate) Discharge Instruction: Apply silver alginate to wound bed as instructed Secondary Dressing Woven Gauze Sponge, Non-Sterile 4x4 in Discharge Instruction: Apply over primary dressing as directed. Secured With Compression Wrap Compression Stockings Environmental education officer) Signed: 05/15/2022 12:17:33 PM By: Fredirick Maudlin MD FACS Entered By: Fredirick Maudlin on 05/15/2022 12:17:33 [name] ([id number]) [id number] Page 5 of 11 -------------------------------------------------------------------------------- Multi-Disciplinary Care Plan Details Patient Name: Date of  Service: DEONDRICK, SEARLS 05/15/2022 10:30 A M Medical Record Number: [id number] Patient Account Number: [id number] Date of Birth/Sex: Treating RN: 1950/07/10 (72 y.o. [name] Primary Care Ether Wolters: [name] Other Clinician: Referring Trayquan Kolakowski: Treating Jody Aguinaga/Extender: [name] in Treatment: 31 Multidisciplinary Care Plan reviewed with physician Active Inactive Venous Leg Ulcer Nursing Diagnoses: Knowledge deficit related to disease process and management Potential for venous Insuffiency (use before diagnosis confirmed) Goals: Patient will maintain optimal edema control Date Initiated: 07/27/2021 Target Resolution Date: 06/07/2022 Goal Status: Active Interventions: Assess peripheral edema status every visit. Treatment Activities: Therapeutic compression applied : 07/27/2021 Notes: Wound/Skin Impairment Nursing Diagnoses: Impaired tissue integrity Knowledge deficit related to ulceration/compromised skin integrity Goals: Patient will have a decrease in wound volume by X% from date: (specify in notes) Date Initiated: 04/12/2021 Date Inactivated: 01/04/2022 Target Resolution Date: 04/23/2021 Goal Status: Met Patient/caregiver will verbalize understanding of skin care regimen Date Initiated: 01/04/2022 Target Resolution Date: 05/25/2022 Goal Status: Active Ulcer/skin breakdown will have a volume reduction of 30% by week 4 Date Initiated: 04/12/2021 Date Inactivated: 04/27/2021 Target Resolution Date: 04/27/2021 Goal Status: Unmet Unmet Reason: infection Ulcer/skin breakdown will have a volume reduction of 50% by week 8 Date Initiated: 04/27/2021 Date Inactivated: 06/29/2021 Target Resolution Date: 06/24/2021 Goal Status: Met Interventions: Assess patient/caregiver ability to obtain necessary supplies Assess patient/caregiver ability to perform ulcer/skin care regimen upon admission and as needed Assess ulceration(s) every  visit Notes: Electronic Signature(s) Signed: 05/16/2022 3:43:48 PM By: Baruch Gouty RN, BSN Entered By: Baruch Gouty on 05/15/2022 11:14:15 [name] ([id number]) [id number] Page 6 of 11 -------------------------------------------------------------------------------- Pain Assessment Details Patient Name: Date of Service: CASY, BRUNETTO 05/15/2022 10:30 A M Medical Record Number: [id number] Patient Account Number: [id number] Date of Birth/Sex: Treating RN: 05/25/1951 (71 y.o. [name] Primary Care Keisa Blow: [name] Other Clinician: Referring Hetvi Shawhan: Treating Shonique Pelphrey/Extender: [name] in Treatment: 28 Active Problems Location of Pain Severity and Description of Pain Patient Has Paino No Site Locations Pain Management and Medication Current Pain Management: Electronic Signature(s) Signed: 05/21/2022 4:55:50 PM By: Blanche East RN Entered By: Blanche East on 05/15/2022 10:43:59 -------------------------------------------------------------------------------- Patient/Caregiver Education Details Patient Name: Date of Service: [name] 11/21/2023andnbsp10:30 A M Medical Record Number: [id number] Patient Account Number: [id number] Date of Birth/Gender: Treating RN: 1950-08-28 (71 y.o. [name] Primary Care Physician: [name] Other Clinician: Referring Physician: Treating Physician/Extender: [name] in Treatment: 45 Education Assessment Education Provided To: Patient Education Topics Provided Offloading: Methods: Explain/Verbal Responses: Reinforcements needed, State content correctly Wound/Skin Impairment: Methods: Explain/Verbal Responses: Reinforcements needed, State content correctly BOY, DELAMATER ([id number]) [id number].pdf Page 7 of 11 Electronic Signature(s) Signed: 05/16/2022 3:43:48 PM By: Baruch Gouty RN,  BSN Entered By: Baruch Gouty on 05/15/2022 11:14:46 -------------------------------------------------------------------------------- Wound Assessment Details Patient Name: Date of Service: [name]. 05/15/2022 10:30 A M Medical Record Number: [id number] Patient Account Number: [id number] Date of Birth/Sex: Treating RN: 03/29/1951 (71 y.o. [name] Primary Care Lyrik Buresh: [name] Other Clinician: Referring  Taegan Standage: Treating Laquasia Pincus/Extender: [name] in Treatment: 56 Wound Status Wound Number: 22 Primary Cyst Etiology: Wound Location: Left, Lateral Lower Leg Wound Open Wounding Event: Bump Status: Date Acquired: 06/03/2021 Comorbid Glaucoma, Sleep Apnea, Hypertension, Peripheral Arterial Disease, Weeks Of Treatment: 49 History: Peripheral Venous Disease, Type II Diabetes, Gout, Osteoarthritis, Clustered Wound: Yes Neuropathy Photos Wound Measurements Length: (cm) Width: (cm) Depth: (cm) Clustered Quantity: Area: (cm) Volume: (cm) 5.6 % Reduction in Area: -380.1% 1.8 % Reduction in Volume: 40% 0.1 Epithelialization: Small (1-33%) 3 Tunneling: No 7.917 Undermining: No 0.792 Wound Description Classification: Full Thickness With Exposed Support Structures Wound Margin: Fibrotic scar, thickened scar Exudate Amount: Medium Exudate Type: Serous Exudate Color: amber Foul Odor After Cleansing: No Slough/Fibrino No Wound Bed Granulation Amount: Medium (34-66%) Exposed Structure Granulation Quality: Pink Fascia Exposed: No Necrotic Amount: Medium (34-66%) Fat Layer (Subcutaneous Tissue) Exposed: Yes Necrotic Quality: Adherent Slough Tendon Exposed: No Muscle Exposed: No Joint Exposed: No Bone Exposed: No Periwound Skin Texture Texture Color No Abnormalities Noted: No No Abnormalities Noted: No Scarring: Yes Hemosiderin Staining: Yes Moisture Temperature / Pain No Abnormalities Noted: No Temperature: No  Abnormality LAKER, THOMPSON ([id number]) [id number] Page 8 of 11 Dry / Scaly: Yes Tenderness on Palpation: Yes Treatment Notes Wound #22 (Lower Leg) Wound Laterality: Left, Lateral Cleanser Soap and Water Discharge Instruction: May shower and wash wound with dial antibacterial soap and water prior to dressing change. Wound Cleanser Discharge Instruction: Cleanse the wound with wound cleanser prior to applying a clean dressing using gauze sponges, not tissue or cotton balls. Peri-Wound Care Sween Lotion (Moisturizing lotion) Discharge Instruction: Apply moisturizing lotion to the leg Topical Skintegrity Hydrogel 4 (oz) Discharge Instruction: Apply hydrogel as directed Primary Dressing Promogran Prisma Matrix, 4.34 (sq in) (silver collagen) Discharge Instruction: Moisten collagen with saline or hydrogel Secondary Dressing ABD Pad, 8x10 Discharge Instruction: Apply over primary dressing as directed. Secured With Elastic Bandage 4 inch (ACE bandage) Discharge Instruction: Secure with ACE bandage as directed. Compression Wrap Compression Stockings Add-Ons Electronic Signature(s) Signed: 05/21/2022 4:55:50 PM By: Blanche East RN Entered By: Blanche East on 05/15/2022 10:54:37 -------------------------------------------------------------------------------- Wound Assessment Details Patient Name: Date of Service: [name]. 05/15/2022 10:30 A M Medical Record Number: [id number] Patient Account Number: [id number] Date of Birth/Sex: Treating RN: 1950/11/30 (71 y.o. [name] Primary Care Dylyn Mclaren: [name] Other Clinician: Referring Brittney Mucha: Treating Nadim Malia/Extender: [name] in Treatment: 56 Wound Status Wound Number: [id number] Primary Diabetic Wound/Ulcer of the Lower Extremity Etiology: Wound Location: Left, Medial Foot Wound Open Wounding Event: Blister Status: Date Acquired: 02/01/2022 Comorbid Glaucoma,  Sleep Apnea, Hypertension, Peripheral Arterial Disease, Weeks Of Treatment: 14 History: Peripheral Venous Disease, Type II Diabetes, Gout, Osteoarthritis, Clustered Wound: No Neuropathy Photos CLARICE, BONAVENTURE ([id number]) [id number] Page 9 of 11 Wound Measurements Length: (cm) 1 Width: (cm) 1.3 Depth: (cm) 0.1 Area: (cm) 1.021 Volume: (cm) 0.102 % Reduction in Area: 0% % Reduction in Volume: 0% Epithelialization: Medium (34-66%) Tunneling: No Undermining: No Wound Description Classification: Grade 1 Wound Margin: Distinct, outline attached Exudate Amount: Medium Exudate Type: Serous Exudate Color: amber Foul Odor After Cleansing: No Slough/Fibrino No Wound Bed Granulation Amount: Large (67-100%) Exposed Structure Granulation Quality: Red Fascia Exposed: No Necrotic Amount: Small (1-33%) Fat Layer (Subcutaneous Tissue) Exposed: Yes Tendon Exposed: No Muscle Exposed: No Joint Exposed: No Bone Exposed: No Periwound Skin Texture Texture Color No Abnormalities Noted: No No Abnormalities Noted: Yes Callus: Yes Temperature / Pain Temperature: No Abnormality Moisture  No Abnormalities Noted: No Dry / Scaly: No Maceration: Yes Treatment Notes Wound #27R (Foot) Wound Laterality: Left, Medial Cleanser Peri-Wound Care Topical Primary Dressing KerraCel Ag Gelling Fiber Dressing, 2x2 in (silver alginate) Discharge Instruction: Apply silver alginate to wound bed as instructed Secondary Dressing Optifoam Non-Adhesive Dressing, 4x4 in Discharge Instruction: Apply over primary dressing cut to make foam donut Woven Gauze Sponge, Non-Sterile 4x4 in Discharge Instruction: Apply over primary dressing as directed. Secured With 63M Medipore H Soft Cloth Surgical T ape, 4 x 10 (in/yd) Discharge Instruction: Secure with tape as directed. Compression Wrap Compression Stockings Add-Ons IVEN, EARNHART ([id number]) [id number].pdf Page  10 of 11 Electronic Signature(s) Signed: 05/21/2022 4:55:50 PM By: Blanche East RN Entered By: Blanche East on 05/15/2022 10:56:27 -------------------------------------------------------------------------------- Wound Assessment Details Patient Name: Date of Service: DARSHAN, SOLANKI 05/15/2022 10:30 A M Medical Record Number: [id number] Patient Account Number: [id number] Date of Birth/Sex: Treating RN: 28-Oct-1950 (71 y.o. [name] Primary Care Shenna Brissette: [name] Other Clinician: Referring Lasonja Lakins: Treating Samel Bruna/Extender: [name] in Treatment: 56 Wound Status Wound Number: 28 Primary Diabetic Wound/Ulcer of the Lower Extremity Etiology: Wound Location: Left, Anterior Lower Leg Wound Open Wounding Event: Pressure Injury Status: Date Acquired: 05/04/2022 Comorbid Glaucoma, Sleep Apnea, Hypertension, Peripheral Arterial Disease, Weeks Of Treatment: 1 History: Peripheral Venous Disease, Type II Diabetes, Gout, Osteoarthritis, Clustered Wound: No Neuropathy Photos Wound Measurements Length: (cm) 1.8 Width: (cm) 1.6 Depth: (cm) 0.1 Area: (cm) 2.262 Volume: (cm) 0.226 % Reduction in Area: -620.4% % Reduction in Volume: -629% Epithelialization: Small (1-33%) Tunneling: No Undermining: No Wound Description Classification: Grade 1 Wound Margin: Flat and Intact Exudate Amount: Medium Exudate Type: Serous Exudate Color: amber Foul Odor After Cleansing: No Slough/Fibrino Yes Wound Bed Granulation Amount: Large (67-100%) Exposed Structure Granulation Quality: Pink Fascia Exposed: No Necrotic Amount: None Present (0%) Fat Layer (Subcutaneous Tissue) Exposed: Yes Tendon Exposed: No Joint Exposed: No Bone Exposed: No Periwound Skin Texture Texture Color No Abnormalities Noted: Yes No Abnormalities Noted: No Hemosiderin Staining: Yes Moisture No Abnormalities Noted: No Temperature / Pain Dry / Scaly: Yes Temperature: No  Abnormality Maceration: No PRASHANT, GLOSSER ([id number]) [id number] Page 11 of 11 Treatment Notes Wound #28 (Lower Leg) Wound Laterality: Left, Anterior Cleanser Peri-Wound Care Topical Primary Dressing KerraCel Ag Gelling Fiber Dressing, 2x2 in (silver alginate) Discharge Instruction: Apply silver alginate to wound bed as instructed Secondary Dressing Woven Gauze Sponge, Non-Sterile 4x4 in Discharge Instruction: Apply over primary dressing as directed. Secured With Compression Wrap Compression Stockings Environmental education officer) Signed: 05/21/2022 4:55:50 PM By: Blanche East RN Entered By: Blanche East on 05/15/2022 10:56:54 -------------------------------------------------------------------------------- Vitals Details Patient Name: Date of Service: [name]. 05/15/2022 10:30 A M Medical Record Number: [id number] Patient Account Number: [id number] Date of Birth/Sex: Treating RN: 1951-04-19 (71 y.o. [name] Primary Care Kalayah Leske: [name] Other Clinician: Referring Lashawnda Hancox: Treating Criselda Starke/Extender: [name] in Treatment: 56 Vital Signs Time Taken: 10:40 Temperature (F): 98.2 Height (in): 74 Pulse (bpm): 53 Weight (lbs): 238 Respiratory Rate (breaths/min): 16 Body Mass Index (BMI): 30.6 Blood Pressure (mmHg): 148/77 Capillary Blood Glucose (mg/dl): 68 Reference Range: 80 - 120 mg / dl Electronic Signature(s) Signed: 05/21/2022 4:55:50 PM By: Blanche East RN Entered By: Blanche East on 05/15/2022 10:42:10

## 2022-05-25 ENCOUNTER — Encounter (HOSPITAL_BASED_OUTPATIENT_CLINIC_OR_DEPARTMENT_OTHER): Payer: Medicare Other | Attending: General Surgery | Admitting: General Surgery

## 2022-05-25 DIAGNOSIS — M1990 Unspecified osteoarthritis, unspecified site: Secondary | ICD-10-CM | POA: Diagnosis not present

## 2022-05-25 DIAGNOSIS — Z833 Family history of diabetes mellitus: Secondary | ICD-10-CM | POA: Diagnosis not present

## 2022-05-25 DIAGNOSIS — E1122 Type 2 diabetes mellitus with diabetic chronic kidney disease: Secondary | ICD-10-CM | POA: Insufficient documentation

## 2022-05-25 DIAGNOSIS — I872 Venous insufficiency (chronic) (peripheral): Secondary | ICD-10-CM | POA: Diagnosis not present

## 2022-05-25 DIAGNOSIS — G4730 Sleep apnea, unspecified: Secondary | ICD-10-CM | POA: Insufficient documentation

## 2022-05-25 DIAGNOSIS — I890 Lymphedema, not elsewhere classified: Secondary | ICD-10-CM | POA: Diagnosis not present

## 2022-05-25 DIAGNOSIS — E11621 Type 2 diabetes mellitus with foot ulcer: Secondary | ICD-10-CM | POA: Insufficient documentation

## 2022-05-25 DIAGNOSIS — I129 Hypertensive chronic kidney disease with stage 1 through stage 4 chronic kidney disease, or unspecified chronic kidney disease: Secondary | ICD-10-CM | POA: Diagnosis not present

## 2022-05-25 DIAGNOSIS — L97828 Non-pressure chronic ulcer of other part of left lower leg with other specified severity: Secondary | ICD-10-CM | POA: Diagnosis not present

## 2022-05-25 DIAGNOSIS — N183 Chronic kidney disease, stage 3 unspecified: Secondary | ICD-10-CM | POA: Diagnosis not present

## 2022-05-25 DIAGNOSIS — E1151 Type 2 diabetes mellitus with diabetic peripheral angiopathy without gangrene: Secondary | ICD-10-CM | POA: Insufficient documentation

## 2022-05-25 DIAGNOSIS — M109 Gout, unspecified: Secondary | ICD-10-CM | POA: Diagnosis not present

## 2022-05-25 DIAGNOSIS — H409 Unspecified glaucoma: Secondary | ICD-10-CM | POA: Diagnosis not present

## 2022-05-25 DIAGNOSIS — L97528 Non-pressure chronic ulcer of other part of left foot with other specified severity: Secondary | ICD-10-CM | POA: Diagnosis not present

## 2022-05-25 DIAGNOSIS — L299 Pruritus, unspecified: Secondary | ICD-10-CM | POA: Diagnosis not present

## 2022-05-25 DIAGNOSIS — E1140 Type 2 diabetes mellitus with diabetic neuropathy, unspecified: Secondary | ICD-10-CM | POA: Insufficient documentation

## 2022-05-25 DIAGNOSIS — Z794 Long term (current) use of insulin: Secondary | ICD-10-CM | POA: Insufficient documentation

## 2022-05-25 NOTE — Progress Notes (Signed)
[name], [name] ([id number]) [id number] Page 1 of 19 Visit Report for 05/25/2022 Chief Complaint Document Details Patient Name: Date of Service: [name], [name] 05/25/2022 10:30 A M Medical Record Number: [id number] Patient Account Number: [id number] Date of Birth/Sex: Treating RN: [birth year] (71 y.o. M) Primary Care Provider: [name] Other Clinician: Referring Provider: Treating Provider/Extender: [name] in Treatment: 58 Information Obtained from: Patient Chief Complaint Left leg and foot ulcers 04/12/2021; patient is here for wounds on his left lower leg and left plantar foot over the first metatarsal head Electronic Signature(s) Signed: 05/25/2022 11:25:10 AM By: Fredirick Maudlin MD FACS Entered By: Fredirick Maudlin on 05/25/2022 11:25:10 -------------------------------------------------------------------------------- Debridement Details Patient Name: Date of Service: [name] 05/25/2022 10:30 A M Medical Record Number: [id number] Patient Account Number: [id number] Date of Birth/Sex: Treating RN: 06-Mar-1951 (71 y.o. [name] Primary Care Provider: [name] Other Clinician: Referring Provider: Treating Provider/Extender: [name] in Treatment: 58 Debridement Performed for Assessment: Wound #28 Left,Anterior Lower Leg Performed By: Physician Fredirick Maudlin, MD Debridement Type: Debridement Severity of Tissue Pre Debridement: Fat layer exposed Level of Consciousness (Pre-procedure): Awake and Alert Pre-procedure Verification/Time Out Yes - 11:13 Taken: Start Time: 11:13 Pain Control: Lidocaine 4% T opical Solution T Area Debrided (L x W): otal 3 (cm) x 1.4 (cm) = 4.2 (cm) Tissue and other material debrided: Non-Viable, Eschar, Slough, Slough Level: Non-Viable Tissue Debridement Description: Selective/Open Wound Instrument: Curette Bleeding: Minimum Hemostasis  Achieved: Pressure End Time: 11:14 Procedural Pain: 0 Post Procedural Pain: 0 Response to Treatment: Procedure was tolerated well Level of Consciousness (Post- Awake and Alert procedure): Post Debridement Measurements of Total Wound Length: (cm) 3 Width: (cm) 1.4 Depth: (cm) 0.1 Volume: (cm) 0.33 Character of Wound/Ulcer Post Debridement: Improved [name] ([id number]) [id number] Page 2 of 19 Severity of Tissue Post Debridement: Fat layer exposed Post Procedure Diagnosis Same as Pre-procedure Notes Scribed for Dr. Celine Ahr by J.Scotton Electronic Signature(s) Signed: 05/25/2022 11:58:57 AM By: Fredirick Maudlin MD FACS Signed: 05/25/2022 4:47:16 PM By: Dellie Catholic RN Entered By: Dellie Catholic on 05/25/2022 11:16:36 -------------------------------------------------------------------------------- Debridement Details Patient Name: Date of Service: [name] 05/25/2022 10:30 A M Medical Record Number: [id number] Patient Account Number: [id number] Date of Birth/Sex: Treating RN: Jul 13, 1950 (71 y.o. [name] Primary Care Provider: [name] Other Clinician: Referring Provider: Treating Provider/Extender: [name] in Treatment: 58 Debridement Performed for Assessment: Wound #22 Left,Lateral Lower Leg Performed By: Physician Fredirick Maudlin, MD Debridement Type: Debridement Level of Consciousness (Pre-procedure): Awake and Alert Pre-procedure Verification/Time Out Yes - 11:13 Taken: Start Time: 11:13 Pain Control: Lidocaine 4% T opical Solution T Area Debrided (L x W): otal 5.5 (cm) x 1.5 (cm) = 8.25 (cm) Tissue and other material debrided: Non-Viable, Slough, Slough Level: Non-Viable Tissue Debridement Description: Selective/Open Wound Instrument: Curette Bleeding: Minimum Hemostasis Achieved: Pressure End Time: 11:14 Procedural Pain: 0 Post Procedural Pain: 0 Response to Treatment:  Procedure was tolerated well Level of Consciousness (Post- Awake and Alert procedure): Post Debridement Measurements of Total Wound Length: (cm) 5.5 Width: (cm) 1.5 Depth: (cm) 0.1 Volume: (cm) 0.648 Character of Wound/Ulcer Post Debridement: Improved Post Procedure Diagnosis Same as Pre-procedure Notes Scribed for Dr. Celine Ahr by J.Scotton Electronic Signature(s) Signed: 05/25/2022 11:58:57 AM By: Fredirick Maudlin MD FACS Signed: 05/25/2022 4:47:16 PM By: Dellie Catholic RN Entered By: Dellie Catholic on 05/25/2022 11:18:00 [name] ([id number]) [id number] Page 3 of 19 -------------------------------------------------------------------------------- HPI Details Patient Name: Date  of Service: [name], [name] 05/25/2022 10:30 A M Medical Record Number: [id number] Patient Account Number: [id number] Date of Birth/Sex: Treating RN: [birth year] (71 y.o. M) Primary Care Provider: [name] Other Clinician: Referring Provider: Treating Provider/Extender: [name] in Treatment: 26 History of Present Illness HPI Description: 10/11/17; Mr. Plantz is a 71 year old man who tells me that in 2015 he slipped down the latter traumatizing his left leg. He developed a wound in the same spot the area that we are currently looking at. He states this closed over for the most part although he always felt it was somewhat unstable. In 2016 he hit the same area with the door of his car had this reopened. He tells me that this is never really closed although sometimes an inflow it remains open on a constant basis. He has not been using any specific dressing to this except for topical antibiotics the nature of which were not really sure. His primary doctor did send him to see Dr. [name] of interventional cardiology. He underwent an angiogram on 08/06/17 and he underwent a PTA and directional atherectomy of the lesser distal SFA and popliteal arteries  which resulted in brisk improvement in blood flow. It was noted that he had 2 vessel runoff through the anterior tibial and peroneal. He is also been to see vascular and interventional radiologist. He was not felt to have any significant superficial venous insufficiency. Presumably is not a candidate for any ablation. It was suggested he come here for wound care. The patient is a type II diabetic on insulin. He also has a history of venous insufficiency. ABIs on the left were noncompressible in our clinic 10/21/17; patient we admitted to the clinic last week. 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 We put Iodosorb on him after an aggressive debridement and 3 layer compression. He complained of pain in his ankle and itching with is skin in fact he scratched the area on the medial calf superiorly at the rim of our wraps and he has 2 small open areas in that location today which are new. I changed his primary dressing today to silver collagen. As noted he is already had revascularization and does not have any significant superficial venous insufficiency that would be amenable to ablation 10/28/17; patient admitted to the clinic 2 weeks ago. He has a smaller Wound. Scratch injury from last week revealed. There is large wound over the tibial area. This is smaller. Granulation looks healthy. No need for debridement. 11/04/17; the wound on the left lateral calf looks better. Improved dimensions. Surface of this looks better. We've been maintaining him and Kerlix Coban wraps. He finds this much more comfortable. Silver collagen dressing 11/11/17; left lateral Wound continues to look healthy be making progress. Using a #5 curet I removed removed nonviable skin from the surface of the wound and then necrotic debris from the wound surface. Surface of the wound continues to look healthy. He also has an open area on the left great toenail bed. We've  been using topical antibiotics. 11/19/17; left anterior lateral wound continues to look healthy but it's not closed. He also had a small wound above this on the left leg Initially traumatic wounds in the setting of significant chronic venous insufficiency and stasis dermatitis 11/25/17; left anterior wounds superiorly is closed still a small wound inferiorly. 12/02/17; left anterior tibial area. Arrives today with adherent callus. Post debridement clearly not  completely closed. Hydrofera Blue under 3 layer compression. 12/09/17; left anterior tibia. Circumferential eschar however the wound bed looks stable to improved. We've been using Hydrofera Blue under 3 layer compression 12/17/17; left anterior tibia. Apparently this was felt to be closed however when the wrap was taken off there is a skin tear to reopen wounds in the same area we've been using Hydrofera Blue under 3 layer compression 12/23/17 left anterior tibia. Not close to close this week apparently the [facility] was stuck to this again. Still circumferential eschar requiring debridement. I put a contact layer on this this time under the Hydrofera Blue 12/31/17; left anterior tibia. Wound is better slight amount of hyper-granulation. Using Hydrofera Blue over Adaptic. 01/07/18; left anterior tibia. The wound had some surface eschar however after this was removed he has no open wound.he was already revascularized by Dr. [name] when he came to our clinic with atherectomy of the left SFA and popliteal artery. He was also sent to interventional radiology for venous reflux studies. He was not felt to have significant reflux but certainly has chronic venous changes of his skin with hemosiderin deposition around this area. He will definitely need to lubricate his skin and wear compression stocking and I've talked to him about this. READMISSION 05/26/2018 This is a now 71 year old man we cared for with traumatic wounds on his left anterior lower  extremity. He had been previously revascularized during that admission by Dr. [name]. Apparently in follow-up Dr. [name] noted that he had deterioration in his arterial status. He underwent a stent placement in the distal left SFA on 04/22/2018. 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 He went back to the angiography suite on 04/30/2018 he underwent PTA and balloon angioplasty of the occluded left mid anterior tibial artery, thrombotic occlusion went from 100 to 0% which reconstitutes the posterior tibial artery. He had thrombectomy and aspiration of the peroneal artery. The stent placed in the distal SFA left SFA was still occluded. 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 He has been recuperating [facility] after the most recent hospitalization. He is going home tomorrow. He tells me that 2 weeks ago he traumatized the tip of his left fifth toe. He came in urgently for our review of this. This was a history of before I noted that Dr. [name] had already noted dry gangrenous changes of the left fifth toe 06/09/2018; 2-week follow-up. I did contact Dr. [name] after his last appointment and he apparently saw 1 of Dr. Irven Shelling colleagues the next day. He does not follow-up with Dr. [name] himself until Thursday of this week. 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 Nevertheless there is no evidence of infection no drainage and no pain. He had a new area that this week when we were signing him in today on the left anterior mid tibia area, this is in close proximity to the previous wound we have dealt with in this clinic. 06/23/2018; 2-week follow-up. I did not receive a recent note from Dr. [name] to  review today. Our office is trying to obtain this. He is apparently not planning to do further vascular interventions and wondered about compression to try  and help with the patient's chronic venous insufficiency. However we are also concerned about the arterial flow. He arrives in clinic today with a new area on the left third toe. The areas on the calf/anterior tibia are close to closing. The left fifth toe is still mummified using Betadine. -In reviewing things with the patient he has what sounds like claudication with mild to moderate amount of activity. 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 I prescribed Levaquin over the phone while we attempted to arrange a plan of care. However the patient called yesterday to report he had low-grade fever and he came in today acutely. There is been a marked deterioration in the left third toe ATHENS, LEBEAU ([id number]) [id number] Page 4 of 19 with spreading cellulitis up into the dorsal left foot. He was referred to the emergency room. Readmission: 06/29/2020 patient presents today for reevaluation here in our clinic he was previously treated by Dr. Dellia Nims at the latter part of 2019 in 2 the beginning of 2020. Subsequently we have not seen him since that time in the interim he did have evaluation with vein and vascular specialist specifically Dr. Anice Paganini who did perform quite extensive work for a left femoral to anterior tibial artery bypass. With that being said in the interim the patient has developed significant lymphedema and has wounds that he tells me have really never healed in regard to the incision site on the left leg. He also has multiple wounds on the feet for various reasons some of which is that he tends to pick at his feet. Fortunately there is no signs of active infection systemically at this time he does have some wounds that are little bit deeper but most are fairly  superficial he seems to have good blood flow and overall everything appears to be healthy I see no bone exposed and no obvious signs of osteomyelitis. I do not know that he necessarily needs a x-ray at this point although that something we could consider depending on how things progress. The patient does have a history of lymphedema, diabetes, this is type II, chronic kidney disease stage III, hypertension, and history of peripheral vascular disease. 07/05/2020; patient admitted last week. Is a patient I remember from 2019 he had a spreading infection involving the left foot and we sent him to the hospital. 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 He subsequently had a left femoral to anterior tibial bypass by Dr.Cain vein and vascular. He also has severe lymphedema with chronic skin changes related to that on the left leg. The most problematic area that was new today was on the left medial great toe. This was apparently a small area last week there was purulent drainage which our intake nurse cultured. Also areas on the left medial foot and heel left lateral foot. He has 2 areas on the left medial calf left lateral calf in the setting of the severe lymphedema. 07/13/2020 on evaluation today patient appears to be doing better in my opinion compared to his last visit. The good news is there is no signs of active infection systemically and locally I do not see any signs of infection either. He did have an x-ray which was negative that is great news he had a culture which showed MRSA but at the same time he is been on the doxycycline which has helped. I do think we may want to extend this for  7 additional days 1/25; patient admitted to the clinic a few weeks ago. 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 We have been putting him under compression his edema control is a lot better but he is severe verricused skin on the  left leg. He is really done quite well he still has an open area on the left medial calf and the left medial first metatarsal head. We have been using silver collagen on the leg silver alginate on the foot 07/27/2020 upon evaluation today patient appears to be doing decently well in regard to his wounds. He still has a lot of dry skin on the left leg. Some of this is starting to peel back and I think he may be able to have them out by removing some that today. Fortunately there is no signs of active infection at this time on the left leg although on the right leg he does appear to have swelling and erythema as well as some mild warmth to touch. This does have been concerned about the possibility of cellulitis although within the differential diagnosis I do think that potentially a DVT has to be at least considered. We need to rule that out before proceeding would just call in the cellulitis. Especially since he is having pain in the posterior aspect of his calf muscle. 2/8; the patient had seen sparingly. He has severe skin changes of chronic lymphedema in the left leg thickened hyperkeratotic verrucous skin. He has an open wound on the medial part of the left first met head left mid tibia. He also has a rim of nonepithelialized skin in the anterior mid tibia. He brought in the AmLactin lotion that was been prescribed although I am not sure under compression and its utility. There concern about cellulitis on the right lower leg the last time he was here. He was put on on antibiotics. His DVT rule out was negative. The right leg looks fine he is using his stocking on this area 08/10/2020 upon evaluation today patient appears to be doing well with regard to his leg currently. He has been tolerating the dressing changes without complication. Fortunately there is no signs of active infection which is great news. Overall very pleased with where things stand. 2/22; the patient still has an area on the medial  part of the left first met his head. This looks better than when I last saw this earlier this month he has a rim of epithelialization but still some surface debris. Mostly everything on the left leg is healed. There is still a vulnerable in the left mid tibia area. 08/30/2020 upon evaluation today patient appears to be doing much better in regard to his wounds on his foot. 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 Nonetheless I think we will address that today I Minna send in a prescription for him in that regard. Overall though there does not appear to be any signs of significant worsening. 09/07/2020 on evaluation today patient's wounds over his left foot appear to be doing excellent. I do not see any signs of infection there is some callus buildup this can require debridement for certain but overall I feel like he is managing quite nicely. He still using the AmLactin cream which has been beneficial for him as well. 3/22; left foot wound is closed. There is  no open area here. He is using ammonium lactate lotion to the lower extremities to help exfoliate dry cracked skin. He has compression stockings from elastic therapy in [place]. The wound on the medial part of his left first met head is healed today. READMISSION 04/12/2021 Mr. Mauger is a patient we know fairly well he had a prolonged stay in clinic in 2019 with wounds on his left lateral and left anterior lower extremity in the setting of chronic venous insufficiency. More recently he was here earlier this year with predominantly an area on his left foot first metatarsal head plantar and he says the plantar foot broke down on its not long after we discharged him but he did not come back here. The last few months areas of broken down on his left anterior and again the left lateral lower extremity. The leg itself  is very swollen chronically enlarged a lot of hyperkeratotic dry Berry Q skin in the left lower leg. His edema extends well into the thigh. He was seen by Dr. Donzetta Matters. He had ABIs on 03/02/2021 showing an ABI on the right of 1 with a TBI of 0.72 his ABI in the left at 1.09 TBI of 0.99. Monophasic and biphasic waveforms on the right. On the left monophasic waveforms were noted he went on to have an angiogram on 03/27/2021 this showed the aortic aortic and iliac segments were free of flow-limiting stenosis the left common femoral vein to evaluate the left femoral to anterior tibial artery bypass was unobstructed the bypass was patent without any areas of stenosis. We discharged the patient in bilateral juxta lite stockings but very clearly that was not sufficient to control the swelling and maintain skin integrity. He is clearly going to need compression pumps. 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 This is fortunate because he is on his feet for long periods of time. 10/27; patient comes in with our intake nurse reporting copious amount of green drainage from the left anterior mid tibia the left dorsal foot and to a lesser extent the left medial mid tibia. We left the compression wrap on all week for the amount of edema in his left leg is quite a bit better. We use silver alginate as the primary dressing 11/3; edema control is good. Left anterior lower leg left medial lower leg and the plantar first metatarsal head. The left anterior lower leg required debridement. Deep tissue culture I did of this wound showed MRSA I put him on 10 days of doxycycline which she will start today. We have him in compression wraps. He has a security card and [employer] however he is retiring on November 15. We will need to then get him into a better offloading boot for the left foot perhaps a total contact cast 11/10; edema control is quite good. Left anterior and left medial  lower leg wounds in the setting of chronic venous insufficiency and lymphedema. He also has a substantial area over the left plantar first metatarsal head. 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 He has significant hypergranulation on the left plantar foot wound. The patient is a diabetic but he does not have significant PAD 11/17; edema control is quite good. Left anterior and left medial lower leg wounds look better. The really concerning area remains the area on the left plantar  first metatarsal head. He has a rim of epithelialization. 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 Starting today with a forefoot off loader but . possibly a total contact cast. 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 Arrives with a very significant odor from I think all of the wounds on [name], [name] ([id number]) [id number] Page 5 of 19 the left leg and the left foot. Because of this I did not put a total contact cast on him today but will could still consider this. His wife was having cataract surgery which is the reason he missed the appointment 12/6. I saw this man 5 days ago with a swelling below the popliteal fossa. I thought he actually might have a Baker's cyst however the DVT rule out study that we could arrange right away was negative the technician told me this was not a ruptured Baker's cyst. We attempted to get this aspirated by under ultrasound guidance in interventional radiology however all they did was an ultrasound however it shows an extensive fluid collection 62 x 8 x 9.4 in the left thigh and left calf. The patient states he thinks this started 8 days ago  or so but he really is not complaining of any pain, fever or systemic symptoms. He has not ha 12/20; after some difficulty I managed to get the patient into see Dr. Donzetta Matters. Eventually he was taken into the hospital and had a drain put in the fluid collection below his left knee posteriorly extending into the posterior thigh. He still has the drain in place. Culture of this showed moderate staff aureus few Morganella and few Klebsiella he is now on doxycycline and ciprofloxacin as suggested by infectious disease he is on this for a month. The drain will remain in place until it stops draining 12/29; he comes in today with the 1 wound on his left leg and the area on the left plantar first met head significantly smaller. Both look healthy. He still has the drain in the left leg. He says he has to change this daily. Follows up with Dr. Donzetta Matters on January 11. 06/29/2021; the wounds that I am following on the left leg and left first met head continued to be quite healthy. However the area where his inferior drain is in place had copious amounts of drainage which was green in color. The wound here is larger. Follows up with Dr. Gwenlyn Saran of vein and vascular his surgeon next week as well as infectious disease. He remains on ciprofloxacin and doxycycline. He is not complaining of excessive pain in either one of the drain areas 1/12; the patient saw vascular surgery and infectious disease. Vascular surgery has left the drain in place as there was still some notable drainage still see him back in 2 weeks. Dr. Velna Ochs stop the doxycycline and ciprofloxacin and I do not believe he follows up with them at this point. Culture I did last week showed both doxycycline resistant MRSA and Pseudomonas not sensitive to ciprofloxacin although only in rare titers 1/19; the patient's wound on the left anterior lower leg is just about healed. We have continued healing of the area that was medially on the left leg. Left first plantar  metatarsal head continues to get  smaller. The major problem here is his 2 drain sites 1 on the left upper calf and lateral thigh. There is purulent drainage still from the left lateral thigh. I gave him antibiotics last week but we still have recultured. He has the drain in the area I think this is eventually going to have to come out. I suspect there will be a connecting wound to heal here perhaps with improved VAc 1/26; the patient had his drain removed by vein and vascular on 1/25/. This was a large pocket of fluid in his left thigh that seem to tunnel into his left upper calf. 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 His mention his Penrose drain was removed today. He now has a tunneling wound on his left calf and left thigh. Both of these probe widely towards each other although I cannot really prove that they connect. Both wounds on his lower leg anteriorly are closed and his area over the first metatarsal head on his right foot continues to improve. We are using Hydrofera Blue here. He also saw infectious disease culture of the abscess they noted was polymicrobial with MRSA, Morganella and Klebsiella he was treated with doxycycline and ciprofloxacin for 4 weeks ending on 07/03/2021. They did not recommend any further antibiotics. Notable that while he still had the Penrose drain in place last week he had purulent drainage coming out of the inferior IandD site this grew [facility], MRSA and Pseudomonas but there does not appear to be any active infection in this area today with the drain out and he is not systemically unwell 2/2; with regards to the drain sites the superior one on the thigh actually is closed down the one on the upper left lateral calf measures about 8 and half centimeters which is an improvement seems to be less prominent although still with a lot of drainage. The only remaining wound is over the first metatarsal head on the left foot and this looks to be  continuing to improve with Hydrofera Blue. 2/9; the area on his plantar left foot continues to contract. Callus around the wound edge. The drain sites specifically have not come down in depth. We put the wound VAC on Monday he changed the canister late last night our intake nurse reported a pocket of fluid perhaps caused by our compression wraps 2/16; continued improvement in left foot plantar wound. drainage site in the calf is not improved in terms of depth (wound vac) 2/23; continued improvement in the left foot wound over the first metatarsal head. With regards to the drain sites the area on his thigh laterally is healed however the open area on his calf is small in terms of circumference by still probes in by about 15 cm. Within using the wound VAC. Hydrofera Blue on his foot 08/24/2021: The left first metatarsal head wound continues to improve. The wound bed is healthy with just some surrounding callus. Unfortunately the open drain site on his calf remains open and tunnels at least 15 cm (the extent of a Q-tip). This is despite several weeks of wound VAC treatment. Based on reading back through the notes, there has been really no significant change in the depth of the wound, although the orifice is smaller and the more cranial wound on his thigh has closed. I suspect the tunnel tracks nearly all the way to this location. 08/31/2021: Continued improvement in the left first metatarsal head wound. There has been absolutely no improvement to the long  tunnel from his open drain site on his calf. We have tried to get him into see vascular surgery sooner to consider the possibility of simply filleting the tract open and allowing it to heal from the bottom up, likely with a wound VAC. They have not yet scheduled a sooner appointment than his current mid April 09/14/2021: He was seen by vascular surgery and they took him to the operating room last week. They opened a portion of the tunnel, but did not extend  the entire length of the known open subcutaneous tract. I read Dr. Claretha Cooper operative note and it is not clear from that documentation why only a portion of the tract was opened. The heaped up granulation tissue was curetted and removed from at least some portion of the tract. They did place a wound VAC and applied an Unna boot to the leg. The ulcer on his left first metatarsal head is smaller today. The bed looks good and there is just a small amount of surrounding callus. 09/21/2021: The ulcer on his left first metatarsal head looks to be stalled. There is some callus surrounding the wound but the wound bed itself does not appear particularly dynamic. The tunnel tract on his lateral left leg seems to be roughly the same length or perhaps slightly smaller but the wound bed appears healthy with good granulation tissue. He opened up a new wound on his medial thigh and the site of a prior surgical incision. He says that he did this unconsciously in his sleep by scratching. 09/28/2021: Unfortunately, the ulcer on his left first metatarsal head has extended underneath the callus toward the dorsum of his foot. The medial thigh wounds are roughly the same. The tunnel on his lateral left leg continues to be problematic; it is longer than we are able to actually probe with a Q-tip. I am still not certain as to why Dr. Donzetta Matters did not open this up entirely when he took the patient to the operating room. We will likely be back in the same situation with just a small superficial opening in a long unhealed tract, as the open portion is granulating in nicely. 10/02/2021: The patient was initially scheduled for a nurse visit, but we are also applying a total contact cast today. The plantar foot wound looks clean without significant accumulated callus. We have been applying Prisma silver collagen to the site. 10/05/2021: The patient is here for his first total contact cast change. We have tried using gauze packing strips in  the tunnel on his lateral leg wound, but this does not seem to be working any better than the white VAC foam. The foot ulcer looks about the same with minimal periwound callus. Medial thigh wound is clean with just some overlying eschar. 10/12/2021: The plantar foot wound is stable without any significant accumulation of periwound callus. The surface is viable with good granulation tissue. The medial thigh wounds are much smaller and are epithelializing. On the other hand, he had purulent drainage coming from the tunnel on his lateral leg. He does go back to see Dr. Donzetta Matters next week and is planning to ask him why the wound tunnel was not completely opened at the time of his most recent operation. 10/19/2021: The plantar foot wound is markedly improved and has epithelial tissue coming through the surface. The medial thigh wounds are nearly closed with just a tiny open area. He did see Dr. Donzetta Matters earlier this week and apparently they did discuss the possibility of opening the  sinus tract further and enabling a AKUL, LEGGETTE ([id number]) [id number] Page 6 of 19 wound VAC application. Apparently there are some limits as to what Dr. 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 I think if we could get the tract open to the level of the popliteal fossa, this would greatly aid in her ability to get this chart closed. That being said, however, today when I probed the tract with a Q-tip, I was not able to insert the entirety of the Q-tip as I have on previous occasions. The tunnel is shorter by about 4 cm. The surface is clean with good granulation tissue and no further episodes of purulent drainage. 10/30/2021: Last week, the patient underwent surgery and had the long tract in his leg opened. There was a rind that was debrided, according to the operative report. His medial thigh ulcers are closed. The plantar foot wound is clean with a good surface and  some built up surrounding callus. 11/06/2021: The overall dimensions of the large wound on his lateral leg remain about the same, but there is good granulation tissue present and the tunneling is a little bit shorter. He has a new wound on his anterior tibial surface, in the same location where he had a similar lesion in the past. The plantar foot wound is clean with some buildup surrounding callus. Just toward the medial aspect of his foot, however, there is an area of darkening that once debrided, revealed another opening in the skin surface. 11/13/2021: The anterior tibial surface wound is closed. The plantar foot wound has some surrounding callus buildup. The area of darkening that I debrided last week and revealed an opening in the skin surface has closed again. The tunnel in the large wound on his lateral leg has come in by about 3 cm. There is healthy granulation tissue on the entire wound surface. 11/23/2021: The patient was out of town last week and did wet-to-dry dressings on his large wound. 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 Unfortunately, he picked open the wound on his left medial thigh. He says that it was itching and he just could not stop scratching it until it was open again. The wound on his plantar foot is smaller and has not accumulated a tremendous amount of callus. The lateral leg wound is shallower and the tunnel has also decreased in depth. There is just a little bit of slough accumulation on the surface. 11/30/2021: Another portion of his left medial thigh has been opened up. All of these wounds are fairly superficial with just a little bit of slough and eschar accumulation. The wound on his plantar foot is almost closed with just a bit of eschar and periwound callus accumulation. The lateral leg wound is nearly flush with the surrounding skin and the tunnel is markedly shallower. 12/07/2021: There is just 1  open area on his left medial thigh. It is clean with just a little bit of perimeter eschar. The wound on his plantar foot continues to contract and just has some eschar and periwound callus accumulation. The lateral leg wound is closing at the more distal aspect and the tunnel is smaller. The surface is nearly flush with the surrounding skin and it has a good bed of granulation tissue. 12/14/2021: The thigh and foot wounds are closed. The lateral leg wound has closed over approximately half of its length. The tunnel continues to contract and  the surface is now flush with the surrounding skin. The wound bed has robust granulation tissue. 12/22/2021: The thigh and foot wounds have reopened. The foot wound has a lot of callus accumulation around and over it. The thigh wound is tiny with just a little bit of slough in the wound bed. The lateral leg wound continues to contract. His vascular surgeon took the wound VAC off earlier in the week and the patient has been doing wet-to-dry dressings. There is a little slough accumulation on the surface. The tunnel is about 3 cm in depth at this point. 12/28/2021: The thigh wound is closed again. The foot wound has some callus that subsequently has peeled back exposing just a small slit of a wound. The lateral leg wound Is down to about half the size that it originally was and the tunnel is down to about half a centimeter in depth. 01/04/2022: The thigh wound remains closed. The foot wound has heavy callus overlying the wound site. Once this was debrided, the wound was found to be closed. The lateral leg wound is smaller again this week and very superficial. No tunnel could be identified. 01/12/2022: The thigh and foot wounds both remain closed. The lateral leg wound is now nearly flush with the skin surface. There is good granulation tissue present with a light layer of slough. 01/19/2022: Due to the way his wrap was placed, the patient did not change the dressing on his  thigh at all and so the foam was saturated and his skin is macerated. There is a light layer of slough on the wound surface. The underlying granulation tissue is robust and healthy-appearing. He has heavy callus buildup at the site of his first metatarsal head wound which is still healed. 02/01/2022: He has been in silver alginate. When he removed the dressing from his thigh wound, however, some leg, superficially reopening a portion of the wound that had healed. In addition, underneath the callus at his left first metatarsal head, there appears to be a blister and the wound appears to be open again. 02/08/2022: The lateral leg wound has contracted substantially. There is eschar and a light layer of slough present. He says that it is starting to pull and is uncomfortable. On inspection, there is some puckering of the scar and the eschar is quite dry; this may account for his symptoms. On his first metatarsal head, the wound is much smaller with just some eschar on the surface. The callus has not reaccumulated. He reports that he had a blister come up on his medial thigh wound at the distal aspect. It popped and there is now an opening in his skin again. Looking back through his [place] of wound photos, there is what looks like a permanent suture just deep to this location and it may be trying to erode through. We have been using silver alginate on his wounds. 02/15/2022: The lateral leg wound is about half the size it was last week. It is clean with just a little perimeter eschar and light slough. The wound on his first metatarsal head is about the same with heavy callus overlying it. The medial thigh wound is closed again. He does have some skin changes on the top of his foot that looks potentially yeast related. 02/22/2022: The skin on the top of his foot improved with the use of a topical antifungal. The lateral leg wound continues to contract and is again smaller this week. There is a little bit of  slough and eschar  on the surface. The first metatarsal head wound is a little bit smaller but has reaccumulated a thick callus over the top. He decided to try to trim his toenail and ultimately took the entire nail off of his left great toe. 03/02/2022: His lateral leg wound continues to improve, as does the wound on his left great toe. Unfortunately, it appears that somehow his foot got wet and moisture seeped in through the opening causing his skin to lift. There is a large wound now overlying his first metatarsal on both the plantar, medial, and dorsal portion of his foot. There is necrotic tissue and slough present underneath the shaggy macerated skin. 03/08/2022: The lateral leg wound is smaller again today. There is just a light layer of slough and eschar on the surface. The great toe wound is smaller again today. The first metatarsal wound is a little bit smaller today and does not look nearly as necrotic and macerated. There is still slough and nonviable tissue present. 03/15/2022: The lateral leg wound is narrower and just has a little bit of light slough buildup. The first metatarsal wound still has a fair amount of moisture affecting the periwound skin. The great toe wound is healed. 03/22/2022: The lateral leg wound is now isolated to just at the level of his knee. There is some eschar and slough accumulation. The first metatarsal head wound has epithelialized tremendously and is about half the size that it was last week. He still has some maceration on the top of his foot and a fungal odor is present. 03/29/2022: T oday the patient's foot was macerated, suggesting that the cast got wet. The patient has also been picking at his dry skin and has enlarged the wound on his left lateral leg. In the time between having his cast removed and my evaluation, he had picked more dry skin and opened up additional wounds on his Achilles area and dorsal foot. The plantar first metatarsal head wound, however,  is smaller and clean with just macerated callus around the perimeter and light slough on the surface. The lateral leg wound measured a little bit larger but is also fairly clean with eschar and minimal slough. 04/02/2022: The patient had vascular studies done last Friday and so his cast was not applied. He is here today to have that done. 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 04/05/2022: Both wounds are smaller and quite clean. There is just a little biofilm on the lateral leg wound. [name], [name] ([id number]) [id number] Page 7 of 19 10/20; the patient has a wound on the left lateral surgical incision at the level of his lateral knee this looks clean and improved. He is using silver alginate. He also has an area on his left medial foot for which she is using Hydrofera Blue under a total contact cast both wounds are measuring smaller 04/20/2022: The plantar foot wound has contracted considerably and is very close to closing. The lateral leg wound was measured a little larger, but there was a tiny open area that was included in the measurements that was not included last week. He has some eschar around the perimeter but otherwise the wound looks clean. 04/27/2022: The lateral leg wound looks better this week. He says that midweek, he felt it was very dry and began applying hydrogel to the site. I think this was beneficial. The foot wound is nearly closed underneath a thick layer of dry skin and callus. 05/04/2022: The foot wound is healed. He has  developed a new small ulcer on his anterior tibial surface about midway up his leg. It has a little slough on the surface. The lateral leg wound still is fairly dry, but clean with just a little biofilm on the surface. 05/11/2022: The wound on his foot reopened on Wednesday. A large blister formed which then broke open revealing the fat layer underneath. The ulcer on his anterior tibial surface is a little bit larger  this week. The lateral leg wound has much better moisture balance this week. Fortunately, prior to his foot wound reopening, he did get the cast made for his orthotic. 05/15/2022: Already, the left medial foot wound has improved. The tissue is less macerated and the surface is clean. The ulcer on his anterior tibial surface continues to enlarge. This seems likely secondary to accumulated moisture. The lateral leg wound continues to have an improved moisture balance with the use of collagen. 05/25/2022: The medial foot wound continues to contract. It is now substantially smaller with just a little slough on the surface. The anterior tibial surface wound continues to enlarge further. Once again, this seems to be secondary to moisture. The lateral leg wound does not seem to be changing much in size, but the moisture balance is better. Electronic Signature(s) Signed: 05/25/2022 11:26:03 AM By: Fredirick Maudlin MD FACS Entered By: Fredirick Maudlin on 05/25/2022 11:26:03 -------------------------------------------------------------------------------- Physical Exam Details Patient Name: Date of Service: [name] 05/25/2022 10:30 A M Medical Record Number: [id number] Patient Account Number: [id number] Date of Birth/Sex: Treating RN: [birth year] (71 y.o. M) Primary Care Provider: [name] Other Clinician: Referring Provider: Treating Provider/Extender: [name] in Treatment: 27 Constitutional . . . . No acute distress. Respiratory Normal work of breathing on room air. Notes 05/25/2022: The medial foot wound continues to contract. It is now substantially smaller with just a little slough on the surface. The anterior tibial surface wound continues to enlarge further. Once again, this seems to be secondary to moisture. The lateral leg wound does not seem to be changing much in size, but the moisture balance is better. Electronic Signature(s) Signed: 05/25/2022  11:26:37 AM By: Fredirick Maudlin MD FACS Entered By: Fredirick Maudlin on 05/25/2022 11:26:37 -------------------------------------------------------------------------------- Physician Orders Details Patient Name: Date of Service: [name] 05/25/2022 10:30 A M Medical Record Number: [id number] Patient Account Number: [id number] Date of Birth/Sex: Treating RN: 1951-01-03 (71 y.o. 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 - Dr Celine Ahr - Room 1 Return A Anesthetic Wound #22 Left,Lateral Lower Leg (In clinic) Topical Lidocaine 4% applied to wound bed Bathing/ Shower/ Hygiene May shower with protection but do not get wound dressing(s) wet. - Use a cast protector so you can shower without getting your cast wet Edema Control - Lymphedema / SCD / Other Elevate legs to the level of the heart or above for 30 minutes daily and/or when  sitting, a frequency of: - throughout the day Avoid standing for long periods of time. Patient to wear own compression stockings every day. - on right leg; Moisturize legs daily. Compression stocking or Garment 20-30 mm/Hg pressure to: - left leg daily Off-Loading Total Contact Cast  to Left Lower Extremity Other: - minimal weight bearing left foot Wound Treatment Wound #22 - Lower Leg Wound Laterality: Left, Lateral Cleanser: Soap and Water 3 x Per Week/30 Days Discharge Instructions: May shower and wash wound with dial antibacterial soap and water prior to dressing change. Cleanser: Wound Cleanser 3 x Per Week/30 Days Discharge Instructions: Cleanse the wound with wound cleanser prior to applying a clean dressing using gauze sponges, not tissue or cotton balls. Peri-Wound Care: Sween Lotion (Moisturizing lotion) 3 x Per Week/30 Days Discharge Instructions: Apply moisturizing lotion to the leg Topical: Skintegrity Hydrogel 4 (oz) 3 x Per Week/30 Days Discharge Instructions: Apply hydrogel as directed Prim Dressing: Promogran Prisma Matrix, 4.34 (sq in) (silver collagen) 3 x Per Week/30 Days ary Discharge Instructions: Moisten collagen with saline or hydrogel Secondary Dressing: ABD Pad, 8x10 (Generic) 3 x Per Week/30 Days Discharge Instructions: Apply over primary dressing as directed. Secured With: Elastic Bandage 4 inch (ACE bandage) 3 x Per Week/30 Days Discharge Instructions: Secure with ACE bandage as directed. Wound #27R - Foot Wound Laterality: Left, Medial Prim Dressing: KerraCel Ag Gelling Fiber Dressing, 2x2 in (silver alginate) 1 x Per Week/30 Days ary Discharge Instructions: Apply silver alginate to wound bed as instructed Secondary Dressing: Optifoam Non-Adhesive Dressing, 4x4 in 1 x Per Week/30 Days Discharge Instructions: Apply over primary dressing cut to make foam donut Secondary Dressing: Woven Gauze Sponge, Non-Sterile 4x4 in 1 x Per Week/30 Days Discharge Instructions: Apply over primary dressing as directed. Secured With: 80M Medipore H Soft Cloth Surgical T ape, 4 x 10 (in/yd) 1 x Per Week/30 Days Discharge Instructions: Secure with tape as directed. Wound #28 - Lower Leg Wound Laterality: Left, Anterior Prim Dressing: KerraCel Ag Gelling Fiber  Dressing, 2x2 in (silver alginate) 1 x Per Week/30 Days ary Discharge Instructions: Apply silver alginate to wound bed as instructed Secondary Dressing: Woven Gauze Sponge, Non-Sterile 4x4 in 1 x Per Week/30 Days Discharge Instructions: Apply over primary dressing as directed. [name], [name] ([id number]) [id number] Page 9 of 19 Electronic Signature(s) Signed: 05/25/2022 11:58:57 AM By: Fredirick Maudlin MD FACS Entered By: Fredirick Maudlin on 05/25/2022 11:27:48 -------------------------------------------------------------------------------- Problem List Details Patient Name: Date of Service: [name] 05/25/2022 10:30 A M Medical Record Number: [id number] Patient Account Number: [id number] Date of Birth/Sex: Treating RN: [birth year] (71 y.o. M) Primary Care Provider: [name] Other Clinician: Referring Provider: Treating Provider/Extender: [name] in Treatment: 47 Active Problems ICD-10 Encounter Code Description Active Date MDM Diagnosis E11.621 Type 2 diabetes mellitus with foot ulcer 04/12/2021 No Yes 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 04/12/2021 No Yes I89.0 Lymphedema, not elsewhere classified 04/12/2021 No Yes I87.322 Chronic venous hypertension (idiopathic) with inflammation of left lower 04/12/2021 No Yes extremity L97.828 Non-pressure chronic ulcer of other part of left lower leg with other specified 04/12/2021 No Yes severity L97.528 Non-pressure chronic ulcer of other part of left foot with other specified 04/12/2021 No Yes severity Inactive Problems ICD-10 Code Description Active Date Inactive Date E11.42 Type 2 diabetes mellitus with diabetic polyneuropathy 04/12/2021 04/12/2021 L02.416 Cutaneous abscess of left lower limb 06/13/2021 06/13/2021 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 07/20/2021 07/20/2021  Resolved  Problems Electronic Signature(s) [name], [name] ([id number]) [id number] Page 10 of 19 Signed: 05/25/2022 11:24:49 AM By: Fredirick Maudlin MD FACS Entered By: Fredirick Maudlin on 05/25/2022 11:24:49 -------------------------------------------------------------------------------- Progress Note Details Patient Name: Date of Service: [name] 05/25/2022 10:30 A M Medical Record Number: [id number] Patient Account Number: [id number] Date of Birth/Sex: Treating RN: 03/02/1951 (71 y.o. M) Primary Care Provider: [name] Other Clinician: Referring Provider: Treating Provider/Extender: [name] in Treatment: 34 Subjective Chief Complaint Information obtained from Patient Left leg and foot ulcers 04/12/2021; patient is here for wounds on his left lower leg and left plantar foot over the first metatarsal head History of Present Illness (HPI) 10/11/17; Mr. Redmon is a 71 year old man who tells me that in 2015 he slipped down the latter traumatizing his left leg. He developed a wound in the same spot the area that we are currently looking at. He states this closed over for the most part although he always felt it was somewhat unstable. In 2016 he hit the same area with the door of his car had this reopened. He tells me that this is never really closed although sometimes an inflow it remains open on a constant basis. He has not been using any specific dressing to this except for topical antibiotics the nature of which were not really sure. His primary doctor did send him to see Dr. [name] of interventional cardiology. He underwent an angiogram on 08/06/17 and he underwent a PTA and directional atherectomy of the lesser distal SFA and popliteal arteries which resulted in brisk improvement in blood flow. It was noted that he had 2 vessel runoff through the anterior tibial and peroneal. He is also been to see vascular and interventional  radiologist. He was not felt to have any significant superficial venous insufficiency. Presumably is not a candidate for any ablation. It was suggested he come here for wound care. The patient is a type II diabetic on insulin. He also has a history of venous insufficiency. ABIs on the left were noncompressible in our clinic 10/21/17; patient we admitted to the clinic last week. 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 We put Iodosorb on him after an aggressive debridement and 3 layer compression. He complained of pain in his ankle and itching with is skin in fact he scratched the area on the medial calf superiorly at the rim of our wraps and he has 2 small open areas in that location today which are new. I changed his primary dressing today to silver collagen. As noted he is already had revascularization and does not have any significant superficial venous insufficiency that would be amenable to ablation 10/28/17; patient admitted to the clinic 2 weeks ago. He has a smaller Wound. Scratch injury from last week revealed. There is large wound over the tibial area. This is smaller. Granulation looks healthy. No need for debridement. 11/04/17; the wound on the left lateral calf looks better. Improved dimensions. Surface of this looks better. We've been maintaining him and Kerlix Coban wraps. He finds this much more comfortable. Silver collagen dressing 11/11/17; left lateral Wound continues to look healthy be making progress. Using a #5 curet I removed removed nonviable skin from the surface of the wound and then necrotic debris from the wound surface. Surface of the wound continues to look healthy. ooHe also has an open area on the left great toenail bed. We've been using  topical antibiotics. 11/19/17; left anterior lateral wound continues to look healthy but it's not closed. ooHe also had a small wound above this on the left  leg ooInitially traumatic wounds in the setting of significant chronic venous insufficiency and stasis dermatitis 11/25/17; left anterior wounds superiorly is closed still a small wound inferiorly. 12/02/17; left anterior tibial area. Arrives today with adherent callus. Post debridement clearly not completely closed. Hydrofera Blue under 3 layer compression. 12/09/17; left anterior tibia. Circumferential eschar however the wound bed looks stable to improved. We've been using Hydrofera Blue under 3 layer compression 12/17/17; left anterior tibia. Apparently this was felt to be closed however when the wrap was taken off there is a skin tear to reopen wounds in the same area we've been using Hydrofera Blue under 3 layer compression 12/23/17 left anterior tibia. Not close to close this week apparently the [facility] was stuck to this again. Still circumferential eschar requiring debridement. I put a contact layer on this this time under the Hydrofera Blue 12/31/17; left anterior tibia. Wound is better slight amount of hyper-granulation. Using Hydrofera Blue over Adaptic. 01/07/18; left anterior tibia. The wound had some surface eschar however after this was removed he has no open wound.he was already revascularized by Dr. [name] when he came to our clinic with atherectomy of the left SFA and popliteal artery. He was also sent to interventional radiology for venous reflux studies. He was not felt to have significant reflux but certainly has chronic venous changes of his skin with hemosiderin deposition around this area. He will definitely need to lubricate his skin and wear compression stocking and I've talked to him about this. READMISSION 05/26/2018 This is a now 71 year old man we cared for with traumatic wounds on his left anterior lower extremity. He had been previously revascularized during that admission by Dr. [name]. Apparently in follow-up Dr. [name] noted that he had deterioration in his arterial  status. He underwent a stent placement in the distal left SFA on 04/22/2018. 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 He went back to the angiography suite on 04/30/2018 he underwent PTA and balloon angioplasty of the occluded left mid anterior tibial artery, thrombotic occlusion went from 100 to 0% which reconstitutes the posterior tibial artery. He had thrombectomy and aspiration of the peroneal artery. The stent placed in the distal SFA left SFA was still occluded. 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 He has been recuperating at [facility] in [facility] after the most recent hospitalization. He is going home tomorrow. He tells me that 2 weeks ago he traumatized the tip of his left fifth toe. He came in urgently for our review of this. This was a history of before I noted that Dr. [name] had already noted dry gangrenous changes of the left fifth toe 06/09/2018; 2-week follow-up. I did contact Dr. [name] after his last appointment and he apparently saw 1 of Dr. Irven Shelling colleagues the next day. He does not follow-up with Dr. [name] himself until Thursday of this week. 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 Nevertheless there is no evidence of [name], [name] ([id number]) [id number] Page 11 of 19 infection no drainage and no pain. He  had a new area that this week when we were signing him in today on the left anterior mid tibia area, this is in close proximity to the previous wound we have dealt with in this clinic. 06/23/2018; 2-week follow-up. I did not receive a recent note from Dr. [name] to review today. Our office is trying to obtain this. He is apparently not planning  to do further vascular interventions and wondered about compression to try and help with the patient's chronic venous insufficiency. However we are also concerned about the arterial flow. ooHe arrives in clinic today with a new area on the left third toe. The areas on the calf/anterior tibia are close to closing. The left fifth toe is still mummified using Betadine. -In reviewing things with the patient he has what sounds like claudication with mild to moderate amount of activity. 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 I prescribed Levaquin over the phone while we attempted to arrange a plan of care. However the patient called yesterday to report he had low-grade fever and he came in today acutely. There is been a marked deterioration in the left third toe with spreading cellulitis up into the dorsal left foot. He was referred to the emergency room. Readmission: 06/29/2020 patient presents today for reevaluation here in our clinic he was previously treated by Dr. Dellia Nims at the latter part of 2019 in 2 the beginning of 2020. Subsequently we have not seen him since that time in the interim he did have evaluation with vein and vascular specialist specifically Dr. Anice Paganini who did perform quite extensive work for a left femoral to anterior tibial artery bypass. With that being said in the interim the patient has developed significant lymphedema and has wounds that he tells me have really never healed in regard to the incision site on the left leg. He also has multiple wounds on the feet for various reasons some of which is that he tends to pick at his feet. Fortunately there is no signs of active infection systemically at this time he does have some wounds that are little bit deeper but most are fairly superficial he seems to have good blood flow and overall everything appears to be healthy I see no bone exposed and no obvious signs of osteomyelitis. I do not know  that he necessarily needs a x-ray at this point although that something we could consider depending on how things progress. The patient does have a history of lymphedema, diabetes, this is type II, chronic kidney disease stage III, hypertension, and history of peripheral vascular disease. 07/05/2020; patient admitted last week. Is a patient I remember from 2019 he had a spreading infection involving the left foot and we sent him to the hospital. 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 He subsequently had a left femoral to anterior tibial bypass by Dr.Cain vein and vascular. He also has severe lymphedema with chronic skin changes related to that on the left leg. The most problematic area that was new today was on the left medial great toe. This was apparently a small area last week there was purulent drainage which our intake nurse cultured. Also areas on the left medial foot and heel left lateral foot. He has 2 areas on the left medial calf left lateral calf in the setting of the severe lymphedema. 07/13/2020 on evaluation today patient appears to be doing better in my opinion compared to his  last visit. The good news is there is no signs of active infection systemically and locally I do not see any signs of infection either. He did have an x-ray which was negative that is great news he had a culture which showed MRSA but at the same time he is been on the doxycycline which has helped. I do think we may want to extend this for 7 additional days 1/25; patient admitted to the clinic a few weeks ago. 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 We have been putting him under compression his edema control is a lot better but he is severe verricused skin on the left leg. He is really done quite well he still has an open area on the left medial calf and the left medial first metatarsal head. We have been using silver collagen  on the leg silver alginate on the foot 07/27/2020 upon evaluation today patient appears to be doing decently well in regard to his wounds. He still has a lot of dry skin on the left leg. Some of this is starting to peel back and I think he may be able to have them out by removing some that today. Fortunately there is no signs of active infection at this time on the left leg although on the right leg he does appear to have swelling and erythema as well as some mild warmth to touch. This does have been concerned about the possibility of cellulitis although within the differential diagnosis I do think that potentially a DVT has to be at least considered. We need to rule that out before proceeding would just call in the cellulitis. Especially since he is having pain in the posterior aspect of his calf muscle. 2/8; the patient had seen sparingly. He has severe skin changes of chronic lymphedema in the left leg thickened hyperkeratotic verrucous skin. He has an open wound on the medial part of the left first met head left mid tibia. He also has a rim of nonepithelialized skin in the anterior mid tibia. He brought in the AmLactin lotion that was been prescribed although I am not sure under compression and its utility. There concern about cellulitis on the right lower leg the last time he was here. He was put on on antibiotics. His DVT rule out was negative. The right leg looks fine he is using his stocking on this area 08/10/2020 upon evaluation today patient appears to be doing well with regard to his leg currently. He has been tolerating the dressing changes without complication. Fortunately there is no signs of active infection which is great news. Overall very pleased with where things stand. 2/22; the patient still has an area on the medial part of the left first met his head. This looks better than when I last saw this earlier this month he has a rim of epithelialization but still some surface debris.  Mostly everything on the left leg is healed. There is still a vulnerable in the left mid tibia area. 08/30/2020 upon evaluation today patient appears to be doing much better in regard to his wounds on his foot. 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 Nonetheless I think we will address that today I Minna send in a prescription for him in that regard. Overall though there does not appear to be any signs of  significant worsening. 09/07/2020 on evaluation today patient's wounds over his left foot appear to be doing excellent. I do not see any signs of infection there is some callus buildup this can require debridement for certain but overall I feel like he is managing quite nicely. He still using the AmLactin cream which has been beneficial for him as well. 3/22; left foot wound is closed. There is no open area here. He is using ammonium lactate lotion to the lower extremities to help exfoliate dry cracked skin. He has compression stockings from elastic therapy in [place]. The wound on the medial part of his left first met head is healed today. READMISSION 04/12/2021 Mr. Derksen is a patient we know fairly well he had a prolonged stay in clinic in 2019 with wounds on his left lateral and left anterior lower extremity in the setting of chronic venous insufficiency. More recently he was here earlier this year with predominantly an area on his left foot first metatarsal head plantar and he says the plantar foot broke down on its not long after we discharged him but he did not come back here. The last few months areas of broken down on his left anterior and again the left lateral lower extremity. The leg itself is very swollen chronically enlarged a lot of hyperkeratotic dry Berry Q skin in the left lower leg. His edema extends well into the thigh. He was seen by Dr. Donzetta Matters.  He had ABIs on 03/02/2021 showing an ABI on the right of 1 with a TBI of 0.72 his ABI in the left at 1.09 TBI of 0.99. Monophasic and biphasic waveforms on the right. On the left monophasic waveforms were noted he went on to have an angiogram on 03/27/2021 this showed the aortic aortic and iliac segments were free of flow-limiting stenosis the left common femoral vein to evaluate the left femoral to anterior tibial artery bypass was unobstructed the bypass was patent without any areas of stenosis. We discharged the patient in bilateral juxta lite stockings but very clearly that was not sufficient to control the swelling and maintain skin integrity. He is clearly going to need compression pumps. 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 This is fortunate because he is on his feet for long periods of time. 10/27; patient comes in with our intake nurse reporting copious amount of green drainage from the left anterior mid tibia the left dorsal foot and to a lesser extent the left medial mid tibia. We left the compression wrap on all week for the amount of edema in his left leg is quite a bit better. We use silver alginate as the primary dressing 11/3; edema control is good. Left anterior lower leg left medial lower leg and the plantar first metatarsal head. The left anterior lower leg required debridement. Deep tissue culture I did of this wound showed MRSA I put him on 10 days of doxycycline which she will start today. We have him in compression wraps. He has a security card and [employer] however he is retiring on November 15. We will need to then get him into a better offloading boot for the left foot perhaps a total contact cast [name], [name] ([id number]) [id number] Page 12 of 19 11/10; edema control is quite good. Left anterior and left medial lower leg wounds in the setting of chronic venous insufficiency and lymphedema. He  also has a substantial area over the  left plantar first metatarsal head. 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 He has significant hypergranulation on the left plantar foot wound. The patient is a diabetic but he does not have significant PAD 11/17; edema control is quite good. Left anterior and left medial lower leg wounds look better. The really concerning area remains the area on the left plantar first metatarsal head. He has a rim of epithelialization. 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 Starting today with a forefoot off loader but . possibly a total contact cast. 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 Arrives with a very significant odor from I think all of the wounds on the left leg and the left foot. Because of this I did not put a total contact cast on him today but will could still consider this. His wife was having cataract surgery which is the reason he missed the appointment 12/6. I saw this man 5 days ago with a swelling below the popliteal fossa. I thought he actually might have a Baker's cyst however the DVT rule out study that we could arrange right away was negative the technician told me this was not a ruptured Baker's cyst. We attempted to get this aspirated by under ultrasound guidance in interventional radiology however all they did was an ultrasound however it shows an extensive fluid collection 62 x 8 x 9.4 in the left thigh and left calf. The patient states he thinks this started 8 days ago or so but he really is not complaining of any pain, fever or systemic symptoms. He has not ha 12/20; after some difficulty I managed to get the patient into see Dr.  Donzetta Matters. Eventually he was taken into the hospital and had a drain put in the fluid collection below his left knee posteriorly extending into the posterior thigh. He still has the drain in place. Culture of this showed moderate staff aureus few Morganella and few Klebsiella he is now on doxycycline and ciprofloxacin as suggested by infectious disease he is on this for a month. The drain will remain in place until it stops draining 12/29; he comes in today with the 1 wound on his left leg and the area on the left plantar first met head significantly smaller. Both look healthy. He still has the drain in the left leg. He says he has to change this daily. Follows up with Dr. Donzetta Matters on January 11. 06/29/2021; the wounds that I am following on the left leg and left first met head continued to be quite healthy. However the area where his inferior drain is in place had copious amounts of drainage which was green in color. The wound here is larger. Follows up with Dr. Gwenlyn Saran of vein and vascular his surgeon next week as well as infectious disease. He remains on ciprofloxacin and doxycycline. He is not complaining of excessive pain in either one of the drain areas 1/12; the patient saw vascular surgery and infectious disease. Vascular surgery has left the drain in place as there was still some notable drainage still see him back in 2 weeks. Dr. Velna Ochs stop the doxycycline and ciprofloxacin and I do  not believe he follows up with them at this point. Culture I did last week showed both doxycycline resistant MRSA and Pseudomonas not sensitive to ciprofloxacin although only in rare titers 1/19; the patient's wound on the left anterior lower leg is just about healed. We have continued healing of the area that was medially on the left leg. Left first plantar metatarsal head continues to get smaller. The major problem here is his 2 drain sites 1 on the left upper calf and lateral thigh. There is purulent drainage still from the  left lateral thigh. I gave him antibiotics last week but we still have recultured. He has the drain in the area I think this is eventually going to have to come out. I suspect there will be a connecting wound to heal here perhaps with improved VAc 1/26; the patient had his drain removed by vein and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

## 2022-05-28 NOTE — Progress Notes (Signed)
SHEM, PLEMMONS ([id number]) [id number] Page 1 of 10 Visit Report for 05/25/2022 Arrival Information Details Patient Name: Date of Service: [name], [name] 05/25/2022 10:30 A M Medical Record Number: [id number] Patient Account Number: [id number] Date of Birth/Sex: Treating RN: 12/08/1950 (71 y.o. [name] Primary Care Annalie Wenner: [name] Other Clinician: Referring Phyllis Whitefield: Treating Tyeisha Dinan/Extender: [name] in Treatment: 73 Visit Information History Since Last Visit Added or deleted any medications: No Patient Arrived: Ambulatory Any new allergies or adverse reactions: No Arrival Time: 10:52 Had a fall or experienced change in No Accompanied By: self activities of daily living that may affect Transfer Assistance: None risk of falls: Patient Identification Verified: Yes Signs or symptoms of abuse/neglect since last visito No Secondary Verification Process Completed: Yes Hospitalized since last visit: No Patient Requires Transmission-Based Precautions: No Implantable device outside of the clinic excluding No Patient Has Alerts: Yes cellular tissue based products placed in the center since last visit: Has Dressing in Place as Prescribed: Yes Pain Present Now: No Electronic Signature(s) Signed: 05/28/2022 5:02:57 PM By: Blanche East RN Entered By: Blanche East on 05/25/2022 10:52:47 -------------------------------------------------------------------------------- Encounter Discharge Information Details Patient Name: Date of Service: [name]. 05/25/2022 10:30 A M Medical Record Number: [id number] Patient Account Number: [id number] Date of Birth/Sex: Treating RN: [birth year] (71 y.o. [name] Primary Care Akshar Starnes: [name] Other Clinician: Referring Rye Decoste: Treating Tadd Holtmeyer/Extender: [name] in Treatment: 34 Encounter Discharge Information Items Post Procedure  Vitals Discharge Condition: Stable Temperature (F): 98.3 Ambulatory Status: Ambulatory Pulse (bpm): 62 Discharge Destination: Home Respiratory Rate (breaths/min): 16 Transportation: Private Auto Blood Pressure (mmHg): 137/77 Accompanied By: self Schedule Follow-up Appointment: Yes Clinical Summary of Care: Patient Declined Electronic Signature(s) Signed: 05/25/2022 4:47:16 PM By: Dellie Catholic RN Entered By: Dellie Catholic on 05/25/2022 12:01:32 [name] ([id number]) [id number] Page 2 of 10 -------------------------------------------------------------------------------- Lower Extremity Assessment Details Patient Name: Date of Service: [name] 05/25/2022 10:30 A M Medical Record Number: [id number] Patient Account Number: [id number] Date of Birth/Sex: Treating RN: 07-25-1950 (71 y.o. [name] Primary Care Phelix Fudala: [name] Other Clinician: Referring Kagan Hietpas: Treating Zelda Reames/Extender: [name] in Treatment: 58 Edema Assessment Assessed: [Left: No] [Right: No] Edema: [Left: Ye] [Right: s] Calf Left: Right: Point of Measurement: 41 cm From Medial Instep 4 cm Ankle Left: Right: Point of Measurement: 10 cm From Medial Instep 29 cm Vascular Assessment Pulses: Dorsalis Pedis Palpable: [Left:Yes] Electronic Signature(s) Signed: 05/28/2022 5:02:57 PM By: Blanche East RN Entered By: Blanche East on 05/25/2022 10:53:46 -------------------------------------------------------------------------------- Multi Wound Chart Details Patient Name: Date of Service: [name]. 05/25/2022 10:30 A M Medical Record Number: [id number] Patient Account Number: [id number] Date of Birth/Sex: Treating RN: 1950-08-07 (71 y.o. M) Primary Care Jonael Paradiso: [name] Other Clinician: Referring Swati Granberry: Treating Krysti Hickling/Extender: [name] in Treatment: 58 Vital Signs Height(in):  74 Capillary Blood Glucose(mg/dl): 205 Weight(lbs): 238 Pulse(bpm): 62 Body Mass Index(BMI): 30.6 Blood Pressure(mmHg): 137/77 Temperature(F): 98.3 Respiratory Rate(breaths/min): 16 [22:Photos:] [[id number] Page 3 of 10] Left, Lateral Lower Leg Left, Medial Foot Left, Anterior Lower Leg Wound Location: Bump Blister Pressure Injury Wounding Event: Cyst Diabetic Wound/Ulcer of the Lower Diabetic Wound/Ulcer of the Lower Primary Etiology: Extremity Extremity Glaucoma, Sleep Apnea, Glaucoma, Sleep Apnea, Glaucoma, Sleep Apnea, Comorbid History: Hypertension, Peripheral Arterial Hypertension, Peripheral Arterial Hypertension, Peripheral Arterial Disease, Peripheral Venous Disease, Disease, Peripheral Venous Disease, Disease, Peripheral Venous Disease, Type II Diabetes, Gout, Osteoarthritis, Type II  Diabetes, Gout, Osteoarthritis, Type II Diabetes, Gout, Osteoarthritis, Neuropathy Neuropathy Neuropathy 06/03/2021 02/01/2022 05/04/2022 Date Acquired: 9 16 3 Weeks of Treatment: Open Open Open Wound Status: No Yes No Wound Recurrence: Yes No No Clustered Wound: 3 N/A N/A Clustered Quantity: 5.5x1.5x0.1 0.8x1x0.1 3x1.4x0.1 Measurements L x W x D (cm) 6.48 0.628 3.299 A (cm) : rea 0.648 0.063 0.33 Volume (cm) : -293.00% 38.50% -950.60% % Reduction in A rea: 50.90% 38.20% -964.50% % Reduction in Volume: Full Thickness With Exposed Support Grade 1 Grade 1 Classification: Structures Medium Medium Medium Exudate A mount: Serous Serous Serous Exudate Type: amber amber amber Exudate Color: Fibrotic scar, thickened scar Distinct, outline attached Flat and Intact Wound Margin: Medium (34-66%) Large (67-100%) Large (67-100%) Granulation A mount: Pink Red Pink Granulation Quality: Medium (34-66%) Small (1-33%) None Present (0%) Necrotic A mount: Fat Layer (Subcutaneous Tissue): Yes Fat Layer (Subcutaneous Tissue): Yes Fat Layer (Subcutaneous  Tissue): Yes Exposed Structures: Fascia: No Fascia: No Fascia: No Tendon: No Tendon: No Tendon: No Muscle: No Muscle: No Joint: No Joint: No Joint: No Bone: No Bone: No Bone: No Small (1-33%) Medium (34-66%) Small (1-33%) Epithelialization: Debridement - Selective/Open Wound N/A Debridement - Selective/Open Wound Debridement: Pre-procedure Verification/Time Out 11:13 N/A 11:13 Taken: Lidocaine 4% Topical Solution N/A Lidocaine 4% Topical Solution Pain Control: Slough N/A Necrotic/Eschar, Slough Tissue Debrided: Non-Viable Tissue N/A Non-Viable Tissue Level: 8.25 N/A 4.2 Debridement A (sq cm): rea Curette N/A Curette Instrument: Minimum N/A Minimum Bleeding: Pressure N/A Pressure Hemostasis A chieved: 0 N/A 0 Procedural Pain: 0 N/A 0 Post Procedural Pain: Procedure was tolerated well N/A Procedure was tolerated well Debridement Treatment Response: 5.5x1.5x0.1 N/A 3x1.4x0.1 Post Debridement Measurements L x W x D (cm) 0.648 N/A 0.33 Post Debridement Volume: (cm) Scarring: Yes Callus: Yes No Abnormalities Noted Periwound Skin Texture: Dry/Scaly: Yes Maceration: Yes Maceration: Yes Periwound Skin Moisture: Dry/Scaly: No Dry/Scaly: No Hemosiderin Staining: Yes No Abnormalities Noted Hemosiderin Staining: Yes Periwound Skin Color: No Abnormality No Abnormality No Abnormality Temperature: Yes N/A N/A Tenderness on Palpation: Debridement N/A Debridement Procedures Performed: Treatment Notes Electronic Signature(s) Signed: 05/25/2022 11:25:00 AM By: Fredirick Maudlin MD FACS Entered By: Fredirick Maudlin on 05/25/2022 11:24:59 -------------------------------------------------------------------------------- Multi-Disciplinary Care Plan Details Patient Name: Date of Service: [name]. 05/25/2022 10:30 A M Medical Record Number: [id number] Patient Account Number: [id number] Date of Birth/Sex: Treating RN: [birth year] (71 y.o. [name] Primary Care Aerin Delany: [name] Other Clinician: Referring Jayleon Mcfarlane: Treating Landri Dorsainvil/Extender: Jimmey, Hengel ([id number]) [id number] Page 4 of 10 Weeks in Treatment: Floyd reviewed with physician Active Inactive Venous Leg Ulcer Nursing Diagnoses: Knowledge deficit related to disease process and management Potential for venous Insuffiency (use before diagnosis confirmed) Goals: Patient will maintain optimal edema control Date Initiated: 07/27/2021 Target Resolution Date: 09/23/2022 Goal Status: Active Interventions: Assess peripheral edema status every visit. Treatment Activities: Therapeutic compression applied : 07/27/2021 Notes: Wound/Skin Impairment Nursing Diagnoses: Impaired tissue integrity Knowledge deficit related to ulceration/compromised skin integrity Goals: Patient will have a decrease in wound volume by X% from date: (specify in notes) Date Initiated: 04/12/2021 Date Inactivated: 01/04/2022 Target Resolution Date: 04/23/2021 Goal Status: Met Patient/caregiver will verbalize understanding of skin care regimen Date Initiated: 01/04/2022 Target Resolution Date: 09/23/2022 Goal Status: Active Ulcer/skin breakdown will have a volume reduction of 30% by week 4 Date Initiated: 04/12/2021 Date Inactivated: 04/27/2021 Target Resolution Date: 04/27/2021 Goal Status: Unmet Unmet Reason: infection Ulcer/skin breakdown will have a volume reduction of 50% by week  8 Date Initiated: 04/27/2021 Date Inactivated: 06/29/2021 Target Resolution Date: 06/24/2021 Goal Status: Met Interventions: Assess patient/caregiver ability to obtain necessary supplies Assess patient/caregiver ability to perform ulcer/skin care regimen upon admission and as needed Assess ulceration(s) every visit Notes: Electronic Signature(s) Signed: 05/25/2022 4:47:16 PM By: Dellie Catholic RN Entered By: Dellie Catholic on 05/25/2022 11:58:35 -------------------------------------------------------------------------------- Pain Assessment Details Patient Name: Date of Service: [name]. 05/25/2022 10:30 A M Medical Record Number: [id number] Patient Account Number: [id number] Date of Birth/Sex: Treating RN: 1950-12-30 (71 y.o. [name] Primary Care Keelin Sheridan: [name] Other Clinician: Referring Hashir Deleeuw: Treating Jay Kempe/Extender: [name] in Treatment: [address] JERMIAH, SODERMAN ([id number]) [id number] Page 5 of 10 Location of Pain Severity and Description of Pain Patient Has Paino No Site Locations Rate the pain. Current Pain Level: 0 Pain Management and Medication Current Pain Management: Electronic Signature(s) Signed: 05/28/2022 5:02:57 PM By: Blanche East RN Entered By: Blanche East on 05/25/2022 10:53:19 -------------------------------------------------------------------------------- Patient/Caregiver Education Details Patient Name: Date of Service: [name] 12/1/2023andnbsp10:30 [place] Record Number: [id number] Patient Account Number: [id number] Date of Birth/Gender: Treating RN: 05-31-1951 (71 y.o. [name] Primary Care Physician: [name] Other Clinician: Referring Physician: Treating Physician/Extender: [name] in Treatment: 19 Education Assessment Education Provided To: Patient Education Topics Provided Wound/Skin Impairment: Methods: Explain/Verbal Responses: Return demonstration correctly Electronic Signature(s) Signed: 05/25/2022 4:47:16 PM By: Dellie Catholic RN Entered By: Dellie Catholic on 05/25/2022 11:58:52 -------------------------------------------------------------------------------- Wound Assessment Details Patient Name: Date of Service: [name]. 05/25/2022 10:30 A Kathrin Penner ([id number])  [id number] Page 6 of 10 Medical Record Number: [id number] Patient Account Number: [id number] Date of Birth/Sex: Treating RN: 1951-03-11 (71 y.o. [name] Primary Care Tanveer Brammer: [name] Other Clinician: Referring Anarosa Kubisiak: Treating Collyn Selk/Extender: [name] in Treatment: 58 Wound Status Wound Number: 22 Primary Cyst Etiology: Wound Location: Left, Lateral Lower Leg Wound Open Wounding Event: Bump Status: Date Acquired: 06/03/2021 Comorbid Glaucoma, Sleep Apnea, Hypertension, Peripheral Arterial Disease, Weeks Of Treatment: 50 History: Peripheral Venous Disease, Type II Diabetes, Gout, Osteoarthritis, Clustered Wound: Yes Neuropathy Photos Wound Measurements Length: (cm) Width: (cm) Depth: (cm) Clustered Quantity: Area: (cm) Volume: (cm) 5.5 % Reduction in Area: -293% 1.5 % Reduction in Volume: 50.9% 0.1 Epithelialization: Small (1-33%) 3 Tunneling: No 6.48 Undermining: No 0.648 Wound Description Classification: Full Thickness With Exposed Support Structures Wound Margin: Fibrotic scar, thickened scar Exudate Amount: Medium Exudate Type: Serous Exudate Color: amber Foul Odor After Cleansing: No Slough/Fibrino No Wound Bed Granulation Amount: Medium (34-66%) Exposed Structure Granulation Quality: Pink Fascia Exposed: No Necrotic Amount: Medium (34-66%) Fat Layer (Subcutaneous Tissue) Exposed: Yes Necrotic Quality: Adherent Slough Tendon Exposed: No Muscle Exposed: No Joint Exposed: No Bone Exposed: No Periwound Skin Texture Texture Color No Abnormalities Noted: No No Abnormalities Noted: No Scarring: Yes Hemosiderin Staining: Yes Moisture Temperature / Pain No Abnormalities Noted: No Temperature: No Abnormality Dry / Scaly: Yes Tenderness on Palpation: Yes Treatment Notes Wound #22 (Lower Leg) Wound Laterality: Left, Lateral Cleanser Soap and Water Discharge Instruction: May shower  and wash wound with dial antibacterial soap and water prior to dressing change. Wound Cleanser Discharge Instruction: Cleanse the wound with wound cleanser prior to applying a clean dressing using gauze sponges, not tissue or cotton balls. Peri-Wound Care Sween Lotion (Moisturizing lotion) QUINCE, SANTANA ([id number]) [id number] Page 7 of 10 Discharge Instruction: Apply moisturizing lotion to the leg Topical Skintegrity Hydrogel 4 (oz)  Discharge Instruction: Apply hydrogel as directed Primary Dressing Promogran Prisma Matrix, 4.34 (sq in) (silver collagen) Discharge Instruction: Moisten collagen with saline or hydrogel Secondary Dressing ABD Pad, 8x10 Discharge Instruction: Apply over primary dressing as directed. Secured With Elastic Bandage 4 inch (ACE bandage) Discharge Instruction: Secure with ACE bandage as directed. Compression Wrap Compression Stockings Add-Ons Electronic Signature(s) Signed: 05/28/2022 5:02:57 PM By: Blanche East RN Entered By: Blanche East on 05/25/2022 10:58:16 -------------------------------------------------------------------------------- Wound Assessment Details Patient Name: Date of Service: [name]. 05/25/2022 10:30 A M Medical Record Number: [id number] Patient Account Number: [id number] Date of Birth/Sex: Treating RN: 08-May-1951 (71 y.o. [name] Primary Care Nishant Schrecengost: [name] Other Clinician: Referring Tristin Gladman: Treating Kayana Thoen/Extender: [name] in Treatment: 58 Wound Status Wound Number: [id number] Primary Diabetic Wound/Ulcer of the Lower Extremity Etiology: Wound Location: Left, Medial Foot Wound Open Wounding Event: Blister Status: Date Acquired: 02/01/2022 Comorbid Glaucoma, Sleep Apnea, Hypertension, Peripheral Arterial Disease, Weeks Of Treatment: 16 History: Peripheral Venous Disease, Type II Diabetes, Gout, Osteoarthritis, Clustered Wound: No  Neuropathy Photos Wound Measurements Length: (cm) 0.8 Width: (cm) 1 Depth: (cm) 0.1 Area: (cm) 0.628 Volume: (cm) 0.063 VIRL, COBLE ([id number]) Wound Description Classification: Grade 1 Wound Margin: Distinct, outline attached Exudate Amount: Medium Exudate Type: Serous Exudate Color: amber Foul Odor After Cleansing: No Slough/Fibrino No % Reduction in Area: 38.5% % Reduction in Volume: 38.2% Epithelialization: Medium (34-66%) Tunneling: No Undermining: No [id number] Page 8 of 10 Wound Bed Granulation Amount: Large (67-100%) Exposed Structure Granulation Quality: Red Fascia Exposed: No Necrotic Amount: Small (1-33%) Fat Layer (Subcutaneous Tissue) Exposed: Yes Tendon Exposed: No Muscle Exposed: No Joint Exposed: No Bone Exposed: No Periwound Skin Texture Texture Color No Abnormalities Noted: No No Abnormalities Noted: Yes Callus: Yes Temperature / Pain Temperature: No Abnormality Moisture No Abnormalities Noted: No Dry / Scaly: No Maceration: Yes Treatment Notes Wound #27R (Foot) Wound Laterality: Left, Medial Cleanser Peri-Wound Care Topical Primary Dressing KerraCel Ag Gelling Fiber Dressing, 2x2 in (silver alginate) Discharge Instruction: Apply silver alginate to wound bed as instructed Secondary Dressing Optifoam Non-Adhesive Dressing, 4x4 in Discharge Instruction: Apply over primary dressing cut to make foam donut Woven Gauze Sponge, Non-Sterile 4x4 in Discharge Instruction: Apply over primary dressing as directed. Secured With 56M Medipore H Soft Cloth Surgical T ape, 4 x 10 (in/yd) Discharge Instruction: Secure with tape as directed. Compression Wrap Compression Stockings Add-Ons Electronic Signature(s) Signed: 05/28/2022 5:02:57 PM By: Blanche East RN Entered By: Blanche East on 05/25/2022 10:58:48 -------------------------------------------------------------------------------- Wound Assessment Details Patient  Name: Date of Service: [name]. 05/25/2022 10:30 A M Medical Record Number: [id number] Patient Account Number: [id number] Date of Birth/Sex: Treating RN: 27-Mar-1951 (71 y.o. [name] Primary Care Kamau Weatherall: [name] Other Clinician: Referring Kingdom Vanzanten: Treating Polette Nofsinger/Extender: [name] in Treatment: [address], [place] ([id number]) [id number] Page 9 of 10 Wound Status Wound Number: 28 Primary Diabetic Wound/Ulcer of the Lower Extremity Etiology: Wound Location: Left, Anterior Lower Leg Wound Open Wounding Event: Pressure Injury Status: Date Acquired: 05/04/2022 Comorbid Glaucoma, Sleep Apnea, Hypertension, Peripheral Arterial Disease, Weeks Of Treatment: 3 History: Peripheral Venous Disease, Type II Diabetes, Gout, Osteoarthritis, Clustered Wound: No Neuropathy Photos Wound Measurements Length: (cm) 3 Width: (cm) 1.4 Depth: (cm) 0.1 Area: (cm) 3.299 Volume: (cm) 0.33 % Reduction in Area: -950.6% % Reduction in Volume: -964.5% Epithelialization: Small (1-33%) Tunneling: No Undermining: No Wound Description Classification: Grade 1 Wound Margin: Flat and Intact Exudate Amount: Medium Exudate Type: Serous  Exudate Color: amber Foul Odor After Cleansing: No Slough/Fibrino Yes Wound Bed Granulation Amount: Large (67-100%) Exposed Structure Granulation Quality: Pink Fascia Exposed: No Necrotic Amount: None Present (0%) Fat Layer (Subcutaneous Tissue) Exposed: Yes Tendon Exposed: No Joint Exposed: No Bone Exposed: No Periwound Skin Texture Texture Color No Abnormalities Noted: Yes No Abnormalities Noted: No Hemosiderin Staining: Yes Moisture No Abnormalities Noted: No Temperature / Pain Dry / Scaly: No Temperature: No Abnormality Maceration: Yes Treatment Notes Wound #28 (Lower Leg) Wound Laterality: Left, Anterior Cleanser Peri-Wound Care Topical Primary Dressing KerraCel Ag Gelling  Fiber Dressing, 2x2 in (silver alginate) Discharge Instruction: Apply silver alginate to wound bed as instructed Secondary Dressing Woven Gauze Sponge, Non-Sterile 4x4 in Discharge Instruction: Apply over primary dressing as directed. Secured With Compression KYMARI, NUON ([id number]) [id number] Page 10 of 10 Compression Stockings Add-Ons Electronic Signature(s) Signed: 05/28/2022 5:02:57 PM By: Blanche East RN Entered By: Blanche East on 05/25/2022 10:59:19 -------------------------------------------------------------------------------- Vitals Details Patient Name: Date of Service: [name]. 05/25/2022 10:30 A M Medical Record Number: [id number] Patient Account Number: [id number] Date of Birth/Sex: Treating RN: [birth year] (71 y.o. [name] Primary Care Donnajean Chesnut: [name] Other Clinician: Referring Carmeline Kowal: Treating Beyza Bellino/Extender: [name] in Treatment: 39 Vital Signs Time Taken: 10:45 Temperature (F): 98.3 Height (in): 74 Pulse (bpm): 62 Weight (lbs): 238 Respiratory Rate (breaths/min): 16 Body Mass Index (BMI): 30.6 Blood Pressure (mmHg): 137/77 Capillary Blood Glucose (mg/dl): 205 Reference Range: 80 - 120 mg / dl Electronic Signature(s) Signed: 05/28/2022 5:02:57 PM By: Blanche East RN Entered By: Blanche East on 05/25/2022 10:53:12

## 2022-06-01 ENCOUNTER — Encounter (HOSPITAL_BASED_OUTPATIENT_CLINIC_OR_DEPARTMENT_OTHER): Payer: Medicare Other | Admitting: General Surgery

## 2022-06-01 DIAGNOSIS — E11621 Type 2 diabetes mellitus with foot ulcer: Secondary | ICD-10-CM | POA: Diagnosis not present

## 2022-06-01 NOTE — Progress Notes (Signed)
JACQUESE, HACKMAN ([id number]) [id number] Page 1 of 18 Visit Report for 06/01/2022 Chief Complaint Document Details Patient Name: Date of Service: [name], [name] 06/01/2022 10:30 A M Medical Record Number: [id number] Patient Account Number: [id number] Date of Birth/Sex: Treating RN: [birth year] (71 y.o. M) Primary Care Provider: [name] Other Clinician: Referring Provider: Treating Provider/Extender: [name] in Treatment: 59 Information Obtained from: Patient Chief Complaint Left leg and foot ulcers 04/12/2021; patient is here for wounds on his left lower leg and left plantar foot over the first metatarsal head Electronic Signature(s) Signed: 06/01/2022 11:34:16 AM By: Fredirick Maudlin MD FACS Entered By: Fredirick Maudlin on 06/01/2022 11:34:16 -------------------------------------------------------------------------------- Debridement Details Patient Name: Date of Service: [name]. 06/01/2022 10:30 A M Medical Record Number: [id number] Patient Account Number: [id number] Date of Birth/Sex: Treating RN: 1951-06-14 (71 y.o. [name] Primary Care Provider: [name] Other Clinician: Referring Provider: Treating Provider/Extender: [name] in Treatment: 59 Debridement Performed for Assessment: Wound #22 Left,Proximal,Lateral Lower Leg Performed By: Physician Fredirick Maudlin, MD Debridement Type: Debridement Level of Consciousness (Pre-procedure): Awake and Alert Pre-procedure Verification/Time Out Yes - 11:12 Taken: Start Time: 11:12 Pain Control: Lidocaine 5% topical ointment T Area Debrided (L x W): otal 4.9 (cm) x 1.3 (cm) = 6.37 (cm) Tissue and other material debrided: Non-Viable, Slough, Subcutaneous, Slough Level: Skin/Subcutaneous Tissue Debridement Description: Excisional Instrument: Curette Bleeding: Minimum Hemostasis Achieved: Pressure End Time: 11:13 Procedural  Pain: 0 Post Procedural Pain: 0 Response to Treatment: Procedure was tolerated well Level of Consciousness (Post- Awake and Alert procedure): Post Debridement Measurements of Total Wound Length: (cm) 4.9 Width: (cm) 1.3 Depth: (cm) 0.1 Volume: (cm) 0.5 Character of Wound/Ulcer Post Debridement: Improved TILLMON, KISLING ([id number]) [id number] Page 2 of 18 Post Procedure Diagnosis Same as Pre-procedure Notes Scribed for Dr. Celine Ahr by J.Scotton Electronic Signature(s) Signed: 06/01/2022 12:01:03 PM By: Dellie Catholic RN Signed: 06/01/2022 12:03:59 PM By: Fredirick Maudlin MD FACS Entered By: Dellie Catholic on 06/01/2022 11:19:57 -------------------------------------------------------------------------------- HPI Details Patient Name: Date of Service: [name]. 06/01/2022 10:30 A M Medical Record Number: [id number] Patient Account Number: [id number] Date of Birth/Sex: Treating RN: 10-20-1950 (71 y.o. M) Primary Care Provider: [name] Other Clinician: Referring Provider: Treating Provider/Extender: [name] in Treatment: 71 History of Present Illness HPI Description: 10/11/17; Mr. Sidor is a 71 year old man who tells me that in 2015 he slipped down the latter traumatizing his left leg. He developed a wound in the same spot the area that we are currently looking at. He states this closed over for the most part although he always felt it was somewhat unstable. In 2016 he hit the same area with the door of his car had this reopened. He tells me that this is never really closed although sometimes an inflow it remains open on a constant basis. He has not been using any specific dressing to this except for topical antibiotics the nature of which were not really sure. His primary doctor did send him to see Dr. [name] of interventional cardiology. He underwent an angiogram on 08/06/17 and he underwent a PTA and  directional atherectomy of the lesser distal SFA and popliteal arteries which resulted in brisk improvement in blood flow. It was noted that he had 2 vessel runoff through the anterior tibial and peroneal. He is also been to see vascular and interventional radiologist. He was not felt to have any significant superficial venous insufficiency. Presumably is not a  candidate for any ablation. It was suggested he come here for wound care. The patient is a type II diabetic on insulin. He also has a history of venous insufficiency. ABIs on the left were noncompressible in our clinic 10/21/17; patient we admitted to the clinic last week. 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 We put Iodosorb on him after an aggressive debridement and 3 layer compression. He complained of pain in his ankle and itching with is skin in fact he scratched the area on the medial calf superiorly at the rim of our wraps and he has 2 small open areas in that location today which are new. I changed his primary dressing today to silver collagen. As noted he is already had revascularization and does not have any significant superficial venous insufficiency that would be amenable to ablation 10/28/17; patient admitted to the clinic 2 weeks ago. He has a smaller Wound. Scratch injury from last week revealed. There is large wound over the tibial area. This is smaller. Granulation looks healthy. No need for debridement. 11/04/17; the wound on the left lateral calf looks better. Improved dimensions. Surface of this looks better. We've been maintaining him and Kerlix Coban wraps. He finds this much more comfortable. Silver collagen dressing 11/11/17; left lateral Wound continues to look healthy be making progress. Using a #5 curet I removed removed nonviable skin from the surface of the wound and then necrotic debris from the wound surface. Surface of the wound continues to look  healthy. He also has an open area on the left great toenail bed. We've been using topical antibiotics. 11/19/17; left anterior lateral wound continues to look healthy but it's not closed. He also had a small wound above this on the left leg Initially traumatic wounds in the setting of significant chronic venous insufficiency and stasis dermatitis 11/25/17; left anterior wounds superiorly is closed still a small wound inferiorly. 12/02/17; left anterior tibial area. Arrives today with adherent callus. Post debridement clearly not completely closed. Hydrofera Blue under 3 layer compression. 12/09/17; left anterior tibia. Circumferential eschar however the wound bed looks stable to improved. We've been using Hydrofera Blue under 3 layer compression 12/17/17; left anterior tibia. Apparently this was felt to be closed however when the wrap was taken off there is a skin tear to reopen wounds in the same area we've been using Hydrofera Blue under 3 layer compression 12/23/17 left anterior tibia. Not close to close this week apparently the [facility] was stuck to this again. Still circumferential eschar requiring debridement. I put a contact layer on this this time under the Hydrofera Blue 12/31/17; left anterior tibia. Wound is better slight amount of hyper-granulation. Using Hydrofera Blue over Adaptic. 01/07/18; left anterior tibia. The wound had some surface eschar however after this was removed he has no open wound.he was already revascularized by Dr. [name] when he came to our clinic with atherectomy of the left SFA and popliteal artery. He was also sent to interventional radiology for venous reflux studies. He was not felt to have significant reflux but certainly has chronic venous changes of his skin with hemosiderin deposition around this area. He will definitely need to lubricate his skin and wear compression stocking and I've talked to him about this. READMISSION 05/26/2018 This is a now 71 year old  man we cared for with traumatic wounds on his left anterior lower extremity. He had been previously revascularized during that admission by Dr. [name].  Apparently in follow-up Dr. [name] noted that he had deterioration in his arterial status. He underwent a stent placement in the distal left SFA on 04/22/2018. 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 He went back to the angiography suite on 04/30/2018 he underwent PTA and balloon angioplasty of the occluded left mid anterior tibial artery, thrombotic occlusion went from 100 to 0% which reconstitutes the posterior tibial artery. He had thrombectomy and aspiration of the peroneal artery. The stent placed in the distal SFA left SFA was still occluded. He was discharged on Xarelto, it was noted on the discharge summary from this hospitalization that he had gangrene at the tip of his left fifth toe and there were expectations this would auto ASHDON, GILLSON ([id number]) [id number] Page 3 of 18 amputate. 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 He has been recuperating at [place] [facility] in [facility] after the most recent hospitalization. He is going home tomorrow. He tells me that 2 weeks ago he traumatized the tip of his left fifth toe. He came in urgently for our review of this. This was a history of before I noted that Dr. [name] had already noted dry gangrenous changes of the left fifth toe 06/09/2018; 2-week follow-up. I did contact Dr. [name] after his last appointment and he apparently saw 1 of Dr. Irven Shelling colleagues the next day. He does not follow-up with Dr. [name] himself until Thursday of this week. 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 Nevertheless there is no evidence of infection no drainage and no pain. He had a new area that this week when we were signing him in today on the left anterior mid tibia area, this is in  close proximity to the previous wound we have dealt with in this clinic. 06/23/2018; 2-week follow-up. I did not receive a recent note from Dr. [name] to review today. Our office is trying to obtain this. He is apparently not planning to do further vascular interventions and wondered about compression to try and help with the patient's chronic venous insufficiency. However we are also concerned about the arterial flow. He arrives in clinic today with a new area on the left third toe. The areas on the calf/anterior tibia are close to closing. The left fifth toe is still mummified using Betadine. -In reviewing things with the patient he has what sounds like claudication with mild to moderate amount of activity. 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 I prescribed Levaquin over the phone while we attempted to arrange a plan of care. However the patient called yesterday to report he had low-grade fever and he came in today acutely. There is been a marked deterioration in the left third toe with spreading cellulitis up into the dorsal left foot. He was referred to the emergency room. Readmission: 06/29/2020 patient presents today for reevaluation here in our clinic he was previously treated by Dr. Dellia Nims at the latter part of 2019 in 2 the beginning of 2020. Subsequently we have not seen him since that time in the interim he did have evaluation with vein and vascular specialist specifically Dr. Anice Paganini who did perform quite extensive work for a left femoral to anterior tibial artery bypass. With that being said in the interim the patient has developed significant lymphedema and has wounds that he tells me have really never healed in regard to the incision site  on the left leg. He also has multiple wounds on the feet for various reasons some of which is that he tends to pick at his feet. Fortunately there is no signs of active infection systemically at this time he does  have some wounds that are little bit deeper but most are fairly superficial he seems to have good blood flow and overall everything appears to be healthy I see no bone exposed and no obvious signs of osteomyelitis. I do not know that he necessarily needs a x-ray at this point although that something we could consider depending on how things progress. The patient does have a history of lymphedema, diabetes, this is type II, chronic kidney disease stage III, hypertension, and history of peripheral vascular disease. 07/05/2020; patient admitted last week. Is a patient I remember from 2019 he had a spreading infection involving the left foot and we sent him to the hospital. 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 He subsequently had a left femoral to anterior tibial bypass by Dr.Cain vein and vascular. He also has severe lymphedema with chronic skin changes related to that on the left leg. The most problematic area that was new today was on the left medial great toe. This was apparently a small area last week there was purulent drainage which our intake nurse cultured. Also areas on the left medial foot and heel left lateral foot. He has 2 areas on the left medial calf left lateral calf in the setting of the severe lymphedema. 07/13/2020 on evaluation today patient appears to be doing better in my opinion compared to his last visit. The good news is there is no signs of active infection systemically and locally I do not see any signs of infection either. He did have an x-ray which was negative that is great news he had a culture which showed MRSA but at the same time he is been on the doxycycline which has helped. I do think we may want to extend this for 7 additional days 1/25; patient admitted to the clinic a few weeks ago. 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 We have been putting him under compression his edema  control is a lot better but he is severe verricused skin on the left leg. He is really done quite well he still has an open area on the left medial calf and the left medial first metatarsal head. We have been using silver collagen on the leg silver alginate on the foot 07/27/2020 upon evaluation today patient appears to be doing decently well in regard to his wounds. He still has a lot of dry skin on the left leg. Some of this is starting to peel back and I think he may be able to have them out by removing some that today. Fortunately there is no signs of active infection at this time on the left leg although on the right leg he does appear to have swelling and erythema as well as some mild warmth to touch. This does have been concerned about the possibility of cellulitis although within the differential diagnosis I do think that potentially a DVT has to be at least considered. We need to rule that out before proceeding would just call in the cellulitis. Especially since he is having pain in the posterior aspect of his calf muscle. 2/8; the patient had seen sparingly. He has severe skin changes of chronic lymphedema in  the left leg thickened hyperkeratotic verrucous skin. He has an open wound on the medial part of the left first met head left mid tibia. He also has a rim of nonepithelialized skin in the anterior mid tibia. He brought in the AmLactin lotion that was been prescribed although I am not sure under compression and its utility. There concern about cellulitis on the right lower leg the last time he was here. He was put on on antibiotics. His DVT rule out was negative. The right leg looks fine he is using his stocking on this area 08/10/2020 upon evaluation today patient appears to be doing well with regard to his leg currently. He has been tolerating the dressing changes without complication. Fortunately there is no signs of active infection which is great news. Overall very pleased with where  things stand. 2/22; the patient still has an area on the medial part of the left first met his head. This looks better than when I last saw this earlier this month he has a rim of epithelialization but still some surface debris. Mostly everything on the left leg is healed. There is still a vulnerable in the left mid tibia area. 08/30/2020 upon evaluation today patient appears to be doing much better in regard to his wounds on his foot. 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 Nonetheless I think we will address that today I Minna send in a prescription for him in that regard. Overall though there does not appear to be any signs of significant worsening. 09/07/2020 on evaluation today patient's wounds over his left foot appear to be doing excellent. I do not see any signs of infection there is some callus buildup this can require debridement for certain but overall I feel like he is managing quite nicely. He still using the AmLactin cream which has been beneficial for him as well. 3/22; left foot wound is closed. There is no open area here. He is using ammonium lactate lotion to the lower extremities to help exfoliate dry cracked skin. He has compression stockings from elastic therapy in [place]. The wound on the medial part of his left first met head is healed today. READMISSION 04/12/2021 Mr. Ebron is a patient we know fairly well he had a prolonged stay in clinic in 2019 with wounds on his left lateral and left anterior lower extremity in the setting of chronic venous insufficiency. More recently he was here earlier this year with predominantly an area on his left foot first metatarsal head plantar and he says the plantar foot broke down on its not long after we discharged him but he did not come back here. The last few months areas of broken down on his  left anterior and again the left lateral lower extremity. The leg itself is very swollen chronically enlarged a lot of hyperkeratotic dry Berry Q skin in the left lower leg. His edema extends well into the thigh. He was seen by Dr. Donzetta Matters. He had ABIs on 03/02/2021 showing an ABI on the right of 1 with a TBI of 0.72 his ABI in the left at 1.09 TBI of 0.99. Monophasic and biphasic waveforms on the right. On the left monophasic waveforms were noted he went on to have an angiogram on 03/27/2021 this showed the aortic aortic and iliac segments were free of flow-limiting stenosis the left common femoral vein to  evaluate the left femoral to anterior tibial artery bypass was unobstructed the bypass was patent without any areas of stenosis. We discharged the patient in bilateral juxta lite stockings but very clearly that was not sufficient to control the swelling and maintain skin integrity. He is clearly going to need compression pumps. 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 This is fortunate because he is on his feet for long periods of time. JAIDYN, KUHL ([id number]) [id number] Page 4 of 18 10/27; patient comes in with our intake nurse reporting copious amount of green drainage from the left anterior mid tibia the left dorsal foot and to a lesser extent the left medial mid tibia. We left the compression wrap on all week for the amount of edema in his left leg is quite a bit better. We use silver alginate as the primary dressing 11/3; edema control is good. Left anterior lower leg left medial lower leg and the plantar first metatarsal head. The left anterior lower leg required debridement. Deep tissue culture I did of this wound showed MRSA I put him on 10 days of doxycycline which she will start today. We have him in compression wraps. He has a security card and [employer] however he is retiring on November 15. We will need to  then get him into a better offloading boot for the left foot perhaps a total contact cast 11/10; edema control is quite good. Left anterior and left medial lower leg wounds in the setting of chronic venous insufficiency and lymphedema. He also has a substantial area over the left plantar first metatarsal head. 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 He has significant hypergranulation on the left plantar foot wound. The patient is a diabetic but he does not have significant PAD 11/17; edema control is quite good. Left anterior and left medial lower leg wounds look better. The really concerning area remains the area on the left plantar first metatarsal head. He has a rim of epithelialization. 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 Starting today with a forefoot off loader but . possibly a total contact cast. 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 Arrives with a very significant odor from I think all of the wounds on the left leg and the left foot. Because of this I did not put a total contact cast on him today but will could still consider this. His wife was having cataract surgery which is the reason he missed the appointment 12/6. I saw this man 5 days ago with a swelling below the popliteal fossa. I thought he actually might have a Baker's cyst however the DVT rule out study that we could arrange right away was negative the technician told me this was not a ruptured Baker's cyst. We attempted to get this aspirated by under ultrasound guidance in interventional radiology however all they did was an ultrasound however it shows an extensive fluid collection 62 x 8 x 9.4 in the left  thigh and left calf. The patient states he thinks this started 8 days ago or so  but he really is not complaining of any pain, fever or systemic symptoms. He has not ha 12/20; after some difficulty I managed to get the patient into see Dr. Donzetta Matters. Eventually he was taken into the hospital and had a drain put in the fluid collection below his left knee posteriorly extending into the posterior thigh. He still has the drain in place. Culture of this showed moderate staff aureus few Morganella and few Klebsiella he is now on doxycycline and ciprofloxacin as suggested by infectious disease he is on this for a month. The drain will remain in place until it stops draining 12/29; he comes in today with the 1 wound on his left leg and the area on the left plantar first met head significantly smaller. Both look healthy. He still has the drain in the left leg. He says he has to change this daily. Follows up with Dr. Donzetta Matters on January 11. 06/29/2021; the wounds that I am following on the left leg and left first met head continued to be quite healthy. However the area where his inferior drain is in place had copious amounts of drainage which was green in color. The wound here is larger. Follows up with Dr. Gwenlyn Saran of vein and vascular his surgeon next week as well as infectious disease. He remains on ciprofloxacin and doxycycline. He is not complaining of excessive pain in either one of the drain areas 1/12; the patient saw vascular surgery and infectious disease. Vascular surgery has left the drain in place as there was still some notable drainage still see him back in 2 weeks. Dr. Velna Ochs stop the doxycycline and ciprofloxacin and I do not believe he follows up with them at this point. Culture I did last week showed both doxycycline resistant MRSA and Pseudomonas not sensitive to ciprofloxacin although only in rare titers 1/19; the patient's wound on the left anterior lower leg is just about healed. We have continued  healing of the area that was medially on the left leg. Left first plantar metatarsal head continues to get smaller. The major problem here is his 2 drain sites 1 on the left upper calf and lateral thigh. There is purulent drainage still from the left lateral thigh. I gave him antibiotics last week but we still have recultured. He has the drain in the area I think this is eventually going to have to come out. I suspect there will be a connecting wound to heal here perhaps with improved VAc 1/26; the patient had his drain removed by vein and vascular on 1/25/. This was a large pocket of fluid in his left thigh that seem to tunnel into his left upper calf. 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 His mention his Penrose drain was removed today. He now has a tunneling wound on his left calf and left thigh. Both of these probe widely towards each other although I cannot really prove that they connect. Both wounds on his lower leg anteriorly are closed and his area over the first metatarsal head on his right foot continues to improve. We are using Hydrofera Blue here. He also saw infectious disease culture of the abscess they noted was polymicrobial with MRSA, Morganella and Klebsiella he was treated with doxycycline and ciprofloxacin for 4 weeks ending on 07/03/2021. They did not recommend any further antibiotics. Notable that while he still had the Penrose drain in place last week he had purulent drainage coming out of the inferior IandD site this grew [place]  ER, MRSA and Pseudomonas but there does not appear to be any active infection in this area today with the drain out and he is not systemically unwell 2/2; with regards to the drain sites the superior one on the thigh actually is closed down the one on the upper left lateral calf measures about 8 and half centimeters which is an improvement seems to be less prominent although still with a lot of drainage. The only remaining wound  is over the first metatarsal head on the left foot and this looks to be continuing to improve with Hydrofera Blue. 2/9; the area on his plantar left foot continues to contract. Callus around the wound edge. The drain sites specifically have not come down in depth. We put the wound VAC on Monday he changed the canister late last night our intake nurse reported a pocket of fluid perhaps caused by our compression wraps 2/16; continued improvement in left foot plantar wound. drainage site in the calf is not improved in terms of depth (wound vac) 2/23; continued improvement in the left foot wound over the first metatarsal head. With regards to the drain sites the area on his thigh laterally is healed however the open area on his calf is small in terms of circumference by still probes in by about 15 cm. Within using the wound VAC. Hydrofera Blue on his foot 08/24/2021: The left first metatarsal head wound continues to improve. The wound bed is healthy with just some surrounding callus. Unfortunately the open drain site on his calf remains open and tunnels at least 15 cm (the extent of a Q-tip). This is despite several weeks of wound VAC treatment. Based on reading back through the notes, there has been really no significant change in the depth of the wound, although the orifice is smaller and the more cranial wound on his thigh has closed. I suspect the tunnel tracks nearly all the way to this location. 08/31/2021: Continued improvement in the left first metatarsal head wound. There has been absolutely no improvement to the long tunnel from his open drain site on his calf. We have tried to get him into see vascular surgery sooner to consider the possibility of simply filleting the tract open and allowing it to heal from the bottom up, likely with a wound VAC. They have not yet scheduled a sooner appointment than his current mid April 09/14/2021: He was seen by vascular surgery and they took him to the operating  room last week. They opened a portion of the tunnel, but did not extend the entire length of the known open subcutaneous tract. I read Dr. Claretha Cooper operative note and it is not clear from that documentation why only a portion of the tract was opened. The heaped up granulation tissue was curetted and removed from at least some portion of the tract. They did place a wound VAC and applied an Unna boot to the leg. The ulcer on his left first metatarsal head is smaller today. The bed looks good and there is just a small amount of surrounding callus. 09/21/2021: The ulcer on his left first metatarsal head looks to be stalled. There is some callus surrounding the wound but the wound bed itself does not appear particularly dynamic. The tunnel tract on his lateral left leg seems to be roughly the same length or perhaps slightly smaller but the wound bed appears healthy with good granulation tissue. He opened up a new wound on his medial thigh and the site of a prior  surgical incision. He says that he did this unconsciously in his sleep by scratching. MOSTYN, [name] ([id number]) [id number] Page 5 of 18 09/28/2021: Unfortunately, the ulcer on his left first metatarsal head has extended underneath the callus toward the dorsum of his foot. The medial thigh wounds are roughly the same. The tunnel on his lateral left leg continues to be problematic; it is longer than we are able to actually probe with a Q-tip. I am still not certain as to why Dr. Donzetta Matters did not open this up entirely when he took the patient to the operating room. We will likely be back in the same situation with just a small superficial opening in a long unhealed tract, as the open portion is granulating in nicely. 10/02/2021: The patient was initially scheduled for a nurse visit, but we are also applying a total contact cast today. The plantar foot wound looks clean without significant accumulated callus. We have been applying  Prisma silver collagen to the site. 10/05/2021: The patient is here for his first total contact cast change. We have tried using gauze packing strips in the tunnel on his lateral leg wound, but this does not seem to be working any better than the white VAC foam. The foot ulcer looks about the same with minimal periwound callus. Medial thigh wound is clean with just some overlying eschar. 10/12/2021: The plantar foot wound is stable without any significant accumulation of periwound callus. The surface is viable with good granulation tissue. The medial thigh wounds are much smaller and are epithelializing. On the other hand, he had purulent drainage coming from the tunnel on his lateral leg. He does go back to see Dr. Donzetta Matters next week and is planning to ask him why the wound tunnel was not completely opened at the time of his most recent operation. 10/19/2021: The plantar foot wound is markedly improved and has epithelial tissue coming through the surface. The medial thigh wounds are nearly closed with just a tiny open area. He did see Dr. Donzetta Matters earlier this week and apparently they did discuss the possibility of opening the sinus tract further and enabling a wound VAC application. Apparently there are some limits as to what Dr. 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 I think if we could get the tract open to the level of the popliteal fossa, this would greatly aid in her ability to get this chart closed. That being said, however, today when I probed the tract with a Q-tip, I was not able to insert the entirety of the Q-tip as I have on previous occasions. The tunnel is shorter by about 4 cm. The surface is clean with good granulation tissue and no further episodes of purulent drainage. 10/30/2021: Last week, the patient underwent surgery and had the long tract in his leg opened. There was a rind that was debrided, according to the operative report. His medial thigh ulcers  are closed. The plantar foot wound is clean with a good surface and some built up surrounding callus. 11/06/2021: The overall dimensions of the large wound on his lateral leg remain about the same, but there is good granulation tissue present and the tunneling is a little bit shorter. He has a new wound on his anterior tibial surface, in the same location where he had a similar lesion in the past. The plantar foot wound is clean with some buildup surrounding callus. Just toward the medial aspect of his foot, however, there is an area of  darkening that once debrided, revealed another opening in the skin surface. 11/13/2021: The anterior tibial surface wound is closed. The plantar foot wound has some surrounding callus buildup. The area of darkening that I debrided last week and revealed an opening in the skin surface has closed again. The tunnel in the large wound on his lateral leg has come in by about 3 cm. There is healthy granulation tissue on the entire wound surface. 11/23/2021: The patient was out of town last week and did wet-to-dry dressings on his large wound. 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 Unfortunately, he picked open the wound on his left medial thigh. He says that it was itching and he just could not stop scratching it until it was open again. The wound on his plantar foot is smaller and has not accumulated a tremendous amount of callus. The lateral leg wound is shallower and the tunnel has also decreased in depth. There is just a little bit of slough accumulation on the surface. 11/30/2021: Another portion of his left medial thigh has been opened up. All of these wounds are fairly superficial with just a little bit of slough and eschar accumulation. The wound on his plantar foot is almost closed with just a bit of eschar and periwound callus accumulation. The lateral leg wound is nearly flush with the surrounding  skin and the tunnel is markedly shallower. 12/07/2021: There is just 1 open area on his left medial thigh. It is clean with just a little bit of perimeter eschar. The wound on his plantar foot continues to contract and just has some eschar and periwound callus accumulation. The lateral leg wound is closing at the more distal aspect and the tunnel is smaller. The surface is nearly flush with the surrounding skin and it has a good bed of granulation tissue. 12/14/2021: The thigh and foot wounds are closed. The lateral leg wound has closed over approximately half of its length. The tunnel continues to contract and the surface is now flush with the surrounding skin. The wound bed has robust granulation tissue. 12/22/2021: The thigh and foot wounds have reopened. The foot wound has a lot of callus accumulation around and over it. The thigh wound is tiny with just a little bit of slough in the wound bed. The lateral leg wound continues to contract. His vascular surgeon took the wound VAC off earlier in the week and the patient has been doing wet-to-dry dressings. There is a little slough accumulation on the surface. The tunnel is about 3 cm in depth at this point. 12/28/2021: The thigh wound is closed again. The foot wound has some callus that subsequently has peeled back exposing just a small slit of a wound. The lateral leg wound Is down to about half the size that it originally was and the tunnel is down to about half a centimeter in depth. 01/04/2022: The thigh wound remains closed. The foot wound has heavy callus overlying the wound site. Once this was debrided, the wound was found to be closed. The lateral leg wound is smaller again this week and very superficial. No tunnel could be identified. 01/12/2022: The thigh and foot wounds both remain closed. The lateral leg wound is now nearly flush with the skin surface. There is good granulation tissue present with a light layer of slough. 01/19/2022: Due to the  way his wrap was placed, the patient did not change the dressing on his  thigh at all and so the foam was saturated and his skin is macerated. There is a light layer of slough on the wound surface. The underlying granulation tissue is robust and healthy-appearing. He has heavy callus buildup at the site of his first metatarsal head wound which is still healed. 02/01/2022: He has been in silver alginate. When he removed the dressing from his thigh wound, however, some leg, superficially reopening a portion of the wound that had healed. In addition, underneath the callus at his left first metatarsal head, there appears to be a blister and the wound appears to be open again. 02/08/2022: The lateral leg wound has contracted substantially. There is eschar and a light layer of slough present. He says that it is starting to pull and is uncomfortable. On inspection, there is some puckering of the scar and the eschar is quite dry; this may account for his symptoms. On his first metatarsal head, the wound is much smaller with just some eschar on the surface. The callus has not reaccumulated. He reports that he had a blister come up on his medial thigh wound at the distal aspect. It popped and there is now an opening in his skin again. Looking back through his Quinn of wound photos, there is what looks like a permanent suture just deep to this location and it may be trying to erode through. We have been using silver alginate on his wounds. 02/15/2022: The lateral leg wound is about half the size it was last week. It is clean with just a little perimeter eschar and light slough. The wound on his first metatarsal head is about the same with heavy callus overlying it. The medial thigh wound is closed again. He does have some skin changes on the top of his foot that looks potentially yeast related. 02/22/2022: The skin on the top of his foot improved with the use of a topical antifungal. The lateral leg wound continues  to contract and is again smaller this week. There is a little bit of slough and eschar on the surface. The first metatarsal head wound is a little bit smaller but has reaccumulated a thick callus over the top. He decided to try to trim his toenail and ultimately took the entire nail off of his left great toe. 03/02/2022: His lateral leg wound continues to improve, as does the wound on his left great toe. Unfortunately, it appears that somehow his foot got wet and moisture seeped in through the opening causing his skin to lift. There is a large wound now overlying his first metatarsal on both the plantar, medial, and dorsal portion of his foot. There is necrotic tissue and slough present underneath the shaggy macerated skin. 03/08/2022: The lateral leg wound is smaller again today. There is just a light layer of slough and eschar on the surface. The great toe wound is smaller again today. The first metatarsal wound is a little bit smaller today and does not look nearly as necrotic and macerated. There is still slough and nonviable tissue present. MESSI, TWEDT ([id number]) [id number] Page 6 of 18 03/15/2022: The lateral leg wound is narrower and just has a little bit of light slough buildup. The first metatarsal wound still has a fair amount of moisture affecting the periwound skin. The great toe wound is healed. 03/22/2022: The lateral leg wound is now isolated to just at the level of his knee. There is some eschar and slough accumulation. The first metatarsal head wound has epithelialized tremendously and  is about half the size that it was last week. He still has some maceration on the top of his foot and a fungal odor is present. 03/29/2022: T oday the patient's foot was macerated, suggesting that the cast got wet. The patient has also been picking at his dry skin and has enlarged the wound on his left lateral leg. In the time between having his cast removed and my  evaluation, he had picked more dry skin and opened up additional wounds on his Achilles area and dorsal foot. The plantar first metatarsal head wound, however, is smaller and clean with just macerated callus around the perimeter and light slough on the surface. The lateral leg wound measured a little bit larger but is also fairly clean with eschar and minimal slough. 04/02/2022: The patient had vascular studies done last Friday and so his cast was not applied. He is here today to have that done. 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 04/05/2022: Both wounds are smaller and quite clean. There is just a little biofilm on the lateral leg wound. 10/20; the patient has a wound on the left lateral surgical incision at the level of his lateral knee this looks clean and improved. He is using silver alginate. He also has an area on his left medial foot for which she is using Hydrofera Blue under a total contact cast both wounds are measuring smaller 04/20/2022: The plantar foot wound has contracted considerably and is very close to closing. The lateral leg wound was measured a little larger, but there was a tiny open area that was included in the measurements that was not included last week. He has some eschar around the perimeter but otherwise the wound looks clean. 04/27/2022: The lateral leg wound looks better this week. He says that midweek, he felt it was very dry and began applying hydrogel to the site. I think this was beneficial. The foot wound is nearly closed underneath a thick layer of dry skin and callus. 05/04/2022: The foot wound is healed. He has developed a new small ulcer on his anterior tibial surface about midway up his leg. It has a little slough on the surface. The lateral leg wound still is fairly dry, but clean with just a little biofilm on the surface. 05/11/2022: The wound on his foot reopened on Wednesday. A large blister formed which then broke open revealing the fat  layer underneath. The ulcer on his anterior tibial surface is a little bit larger this week. The lateral leg wound has much better moisture balance this week. Fortunately, prior to his foot wound reopening, he did get the cast made for his orthotic. 05/15/2022: Already, the left medial foot wound has improved. The tissue is less macerated and the surface is clean. The ulcer on his anterior tibial surface continues to enlarge. This seems likely secondary to accumulated moisture. The lateral leg wound continues to have an improved moisture balance with the use of collagen. 05/25/2022: The medial foot wound continues to contract. It is now substantially smaller with just a little slough on the surface. The anterior tibial surface wound continues to enlarge further. Once again, this seems to be secondary to moisture. The lateral leg wound does not seem to be changing much in size, but the moisture balance is better. 06/01/2022: The anterior tibial wound is closed. The medial foot wound is down to just a very small, couple of millimeters, opening. The lateral leg wound has good moisture balance, but remains unchanged in  size. Electronic Signature(s) Signed: 06/01/2022 11:36:25 AM By: Fredirick Maudlin MD FACS Entered By: Fredirick Maudlin on 06/01/2022 11:36:24 -------------------------------------------------------------------------------- Physical Exam Details Patient Name: Date of Service: [name]. 06/01/2022 10:30 A M Medical Record Number: [id number] Patient Account Number: [id number] Date of Birth/Sex: Treating RN: Sep 10, 1950 (71 y.o. M) Primary Care Provider: [name] Other Clinician: Referring Provider: Treating Provider/Extender: [name] in Treatment: 14 Constitutional He is hypertensive, but asymptomatic.. . . . No acute distress. Respiratory Normal work of breathing on room air. Notes 06/01/2022: The anterior tibial wound is closed. The medial foot  wound is down to just a very small, couple of millimeters, opening. The lateral leg wound has good moisture balance, but remains unchanged in size. Electronic Signature(s) Signed: 06/01/2022 11:38:57 AM By: Fredirick Maudlin MD FACS Entered By: Fredirick Maudlin on 06/01/2022 11:38:57 [name] ([id number]) [id number] Page 7 of 18 -------------------------------------------------------------------------------- Physician Orders Details Patient Name: Date of Service: LORD, LANCOUR 06/01/2022 10:30 A M Medical Record Number: [id number] Patient Account Number: [id number] Date of Birth/Sex: Treating RN: 06-06-1951 (71 y.o. 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 - Dr Celine Ahr - Room 1 Return A Anesthetic Wound #22 Left,Proximal,Lateral Lower Leg (In clinic) Topical Lidocaine 4% applied to wound bed Bathing/ Shower/ Hygiene May shower with protection but do not get wound dressing(s) wet. - Use a cast protector so you can shower without getting your cast wet Edema Control - Lymphedema / SCD / Other Elevate legs to the level of the heart or above for 30 minutes daily and/or when sitting, a frequency of: - throughout the day Avoid standing for long periods of  time. Patient to wear own compression stockings every day. - on right leg; Moisturize legs daily. Compression stocking or Garment 20-30 mm/Hg pressure to: - left leg daily Off-Loading Total Contact Cast to Left Lower Extremity Other: - minimal weight bearing left foot Wound Treatment Wound #22 - Lower Leg Wound Laterality: Left, Lateral, Proximal Cleanser: Soap and Water 3 x Per Week/30 Days Discharge Instructions: May shower and wash wound with dial antibacterial soap and water prior to dressing change. Cleanser: Wound Cleanser 3 x Per Week/30 Days Discharge Instructions: Cleanse the wound with wound cleanser prior to applying a clean dressing using gauze sponges, not tissue or cotton balls. Peri-Wound Care: Sween Lotion (Moisturizing lotion) 3 x Per Week/30 Days Discharge Instructions: Apply moisturizing lotion to the leg Topical: Skintegrity Hydrogel 4 (oz) 3 x Per Week/30 Days Discharge Instructions: Apply hydrogel as directed Prim Dressing: Endoform 2x2 in 3 x Per Week/30 Days ary Discharge Instructions: Moisten with Hydrogel or saline Secondary Dressing: ABD Pad, 8x10 (Generic) 3 x Per Week/30 Days Discharge Instructions: Apply over primary dressing as directed. Secured With: Elastic Bandage 4 inch (ACE bandage) 3 x Per Week/30 Days Discharge Instructions: Secure with ACE bandage as directed. Wound #27R - Foot Wound Laterality: Left, Medial Prim Dressing: KerraCel Ag Gelling Fiber  Dressing, 2x2 in (silver alginate) ary 1 x Per Week/30 Days DEKLYN, GIBBON ([id number]) [id number] Page 8 of 18 Discharge Instructions: Apply silver alginate to wound bed as instructed Secondary Dressing: Optifoam Non-Adhesive Dressing, 4x4 in 1 x Per Week/30 Days Discharge Instructions: Apply over primary dressing cut to make foam donut Secondary Dressing: Woven Gauze Sponge, Non-Sterile 4x4 in 1 x Per Week/30 Days Discharge Instructions: Apply over primary dressing as  directed. Secured With: 64M Medipore H Soft Cloth Surgical T ape, 4 x 10 (in/yd) 1 x Per Week/30 Days Discharge Instructions: Secure with tape as directed. Add-Ons: TCC 1 x Per Week/30 Days Discharge Instructions: left foot/leg Electronic Signature(s) Signed: 06/01/2022 12:01:03 PM By: Dellie Catholic RN Signed: 06/01/2022 12:03:59 PM By: Fredirick Maudlin MD FACS Entered By: Dellie Catholic on 06/01/2022 11:55:24 -------------------------------------------------------------------------------- Problem List Details Patient Name: Date of Service: [name]. 06/01/2022 10:30 A M Medical Record Number: [id number] Patient Account Number: [id number] Date of Birth/Sex: Treating RN: 07/20/1950 (71 y.o. M) Primary Care Provider: [name] Other Clinician: Referring Provider: Treating Provider/Extender: [name] in Treatment: 72 Active Problems ICD-10 Encounter Code Description Active Date MDM Diagnosis E11.621 Type 2 diabetes mellitus with foot ulcer 04/12/2021 No Yes 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 04/12/2021 No Yes I89.0 Lymphedema, not elsewhere classified 04/12/2021 No Yes I87.322 Chronic venous hypertension (idiopathic) with inflammation of left lower 04/12/2021 No Yes extremity L97.828 Non-pressure chronic ulcer of other part of left lower leg with other specified 04/12/2021 No Yes severity L97.528 Non-pressure chronic ulcer of other part of left foot with other specified 04/12/2021 No Yes severity Inactive Problems ICD-10 Code Description Active Date Inactive Date E11.42 Type 2 diabetes mellitus with diabetic polyneuropathy 04/12/2021 04/12/2021 [name] ([id number]) [id number] Page 9 of 18 L02.416 Cutaneous abscess of left lower limb 06/13/2021 06/13/2021 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 06/01/2022 10:30 A M Medical Record Number: [id number] Patient Account Number: [id number] Date of Birth/Sex: Treating RN: [birth year] (71 y.o. M) Primary Care Provider: [name] Other Clinician: Referring Provider: Treating Provider/Extender: [name] in Treatment: 69 Subjective Chief Complaint Information obtained from Patient Left leg and foot ulcers 04/12/2021; patient is here for wounds on his left lower leg and left plantar foot over the first metatarsal head History of Present Illness (HPI) 10/11/17; Mr. Hanrahan is a 71 year old man who tells me that in 2015 he slipped down the latter traumatizing his left leg. He developed a wound in the same spot the area that we are currently looking at. He states this closed over for the most part although he always felt it was somewhat unstable. In 2016 he hit the same area with the door of his car had this reopened. He tells me that this is never really closed although sometimes an inflow it remains open on a constant basis. He has not been using any specific dressing to this except for topical antibiotics the nature of which were not really sure. His primary doctor did send him to see Dr. [name] of interventional cardiology. He underwent an angiogram on 08/06/17 and he underwent a PTA and directional atherectomy of the lesser distal SFA and popliteal arteries which resulted in brisk improvement in blood flow.  It was noted that he had 2 vessel runoff through the anterior tibial and peroneal. He is also been to see vascular and interventional radiologist. He was not felt to have any significant superficial venous insufficiency.  Presumably is not a candidate for any ablation. It was suggested he come here for wound care. The patient is a type II diabetic on insulin. He also has a history of venous insufficiency. ABIs on the left were noncompressible in our clinic 10/21/17; patient we admitted to the clinic last week. 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 We put Iodosorb on him after an aggressive debridement and 3 layer compression. He complained of pain in his ankle and itching with is skin in fact he scratched the area on the medial calf superiorly at the rim of our wraps and he has 2 small open areas in that location today which are new. I changed his primary dressing today to silver collagen. As noted he is already had revascularization and does not have any significant superficial venous insufficiency that would be amenable to ablation 10/28/17; patient admitted to the clinic 2 weeks ago. He has a smaller Wound. Scratch injury from last week revealed. There is large wound over the tibial area. This is smaller. Granulation looks healthy. No need for debridement. 11/04/17; the wound on the left lateral calf looks better. Improved dimensions. Surface of this looks better. We've been maintaining him and Kerlix Coban wraps. He finds this much more comfortable. Silver collagen dressing 11/11/17; left lateral Wound continues to look healthy be making progress. Using a #5 curet I removed removed nonviable skin from the surface of the wound and then necrotic debris from the wound surface. Surface of the wound continues to look healthy. ooHe also has an open area on the left great toenail bed. We've been using topical antibiotics. 11/19/17; left anterior lateral wound continues to look healthy but it's not closed. ooHe also had a small wound above this on the left leg ooInitially traumatic wounds in the setting of significant chronic venous insufficiency and  stasis dermatitis 11/25/17; left anterior wounds superiorly is closed still a small wound inferiorly. 12/02/17; left anterior tibial area. Arrives today with adherent callus. Post debridement clearly not completely closed. Hydrofera Blue under 3 layer compression. 12/09/17; left anterior tibia. Circumferential eschar however the wound bed looks stable to improved. We've been using Hydrofera Blue under 3 layer compression 12/17/17; left anterior tibia. Apparently this was felt to be closed however when the wrap was taken off there is a skin tear to reopen wounds in the same area we've been using Hydrofera Blue under 3 layer compression 12/23/17 left anterior tibia. Not close to close this week apparently the [facility] was stuck to this again. Still circumferential eschar requiring debridement. I put a contact layer on this this time under the Hydrofera Blue 12/31/17; left anterior tibia. Wound is better slight amount of hyper-granulation. Using Hydrofera Blue over Adaptic. 01/07/18; left anterior tibia. The wound had some surface eschar however after this was removed he has no open wound.he was already revascularized by Dr. [name] when he came to our clinic with atherectomy of the left SFA and popliteal artery. He was also sent to interventional radiology for venous reflux studies. He was not felt to have significant reflux but certainly has chronic venous changes of his skin with hemosiderin deposition around this area. He will definitely need to lubricate his skin and wear  compression stocking and I've talked to him about this. JHONNY, CALIXTO ([id number]) [id number] Page 10 of 18 READMISSION 05/26/2018 This is a now 71 year old man we cared for with traumatic wounds on his left anterior lower extremity. He had been previously revascularized during that admission by Dr. [name]. Apparently in follow-up Dr. [name] noted that he had deterioration in his arterial status. He underwent  a stent placement in the distal left SFA on 04/22/2018. 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 He went back to the angiography suite on 04/30/2018 he underwent PTA and balloon angioplasty of the occluded left mid anterior tibial artery, thrombotic occlusion went from 100 to 0% which reconstitutes the posterior tibial artery. He had thrombectomy and aspiration of the peroneal artery. The stent placed in the distal SFA left SFA was still occluded. 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 He has [facility] after the most recent hospitalization. He is going home tomorrow. He tells me that 2 weeks ago he traumatized the tip of his left fifth toe. He came in urgently for our review of this. This was a history of before I noted that Dr. [name] had already noted dry gangrenous changes of the left fifth toe 06/09/2018; 2-week follow-up. I did contact Dr. [name] after his last appointment and he apparently saw 1 of Dr. Irven Shelling colleagues the next day. He does not follow-up with Dr. [name] himself until Thursday of this week. 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 Nevertheless there is no evidence of infection no drainage and no pain. He had a new area that this week when we were signing him in today on the left anterior mid tibia area, this is in close proximity to the previous wound we have dealt with in this clinic. 06/23/2018; 2-week follow-up. I did not receive a recent note from Dr. [name] to review today. Our office is trying to obtain this. He is apparently not planning to do further vascular interventions and wondered about compression to try and help with the patient's  chronic venous insufficiency. However we are also concerned about the arterial flow. ooHe arrives in clinic today with a new area on the left third toe. The areas on the calf/anterior tibia are close to closing. The left fifth toe is still mummified using Betadine. -In reviewing things with the patient he has what sounds like claudication with mild to moderate amount of activity. 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 I prescribed Levaquin over the phone while we attempted to arrange a plan of care. However the patient called yesterday to report he had low-grade fever and he came in today acutely. There is been a marked deterioration in the left third toe with spreading cellulitis up into the dorsal left foot. He was referred to the emergency room. Readmission: 06/29/2020 patient presents today for reevaluation here in our clinic he was previously treated by Dr. Dellia Nims at the latter part of 2019 in 2 the beginning of 2020. Subsequently we have not seen him since that time in the interim he did have evaluation with vein and vascular specialist specifically Dr. Anice Paganini who did perform quite  extensive work for a left femoral to anterior tibial artery bypass. With that being said in the interim the patient has developed significant lymphedema and has wounds that he tells me have really never healed in regard to the incision site on the left leg. He also has multiple wounds on the feet for various reasons some of which is that he tends to pick at his feet. Fortunately there is no signs of active infection systemically at this time he does have some wounds that are little bit deeper but most are fairly superficial he seems to have good blood flow and overall everything appears to be healthy I see no bone exposed and no obvious signs of osteomyelitis. I do not know that he necessarily needs a x-ray at this point although that something we could consider depending on  how things progress. The patient does have a history of lymphedema, diabetes, this is type II, chronic kidney disease stage III, hypertension, and history of peripheral vascular disease. 07/05/2020; patient admitted last week. Is a patient I remember from 2019 he had a spreading infection involving the left foot and we sent him to the hospital. 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 He subsequently had a left femoral to anterior tibial bypass by Dr.Cain vein and vascular. He also has severe lymphedema with chronic skin changes related to that on the left leg. The most problematic area that was new today was on the left medial great toe. This was apparently a small area last week there was purulent drainage which our intake nurse cultured. Also areas on the left medial foot and heel left lateral foot. He has 2 areas on the left medial calf left lateral calf in the setting of the severe lymphedema. 07/13/2020 on evaluation today patient appears to be doing better in my opinion compared to his last visit. The good news is there is no signs of active infection systemically and locally I do not see any signs of infection either. He did have an x-ray which was negative that is great news he had a culture which showed MRSA but at the same time he is been on the doxycycline which has helped. I do think we may want to extend this for 7 additional days 1/25; patient admitted to the clinic a few weeks ago. 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 We have been putting him under compression his edema control is a lot better but he is severe verricused skin on the left leg. He is really done quite well he still has an open area on the left medial calf and the left medial first metatarsal head. We have been using silver collagen on the leg silver alginate on the foot 07/27/2020 upon evaluation today patient appears to be doing  decently well in regard to his wounds. He still has a lot of dry skin on the left leg. Some of this is starting to peel back and I think he may be able to have them out by removing some that today. Fortunately there is no signs of active infection at this time on the left leg although on the right leg he does appear to have swelling and erythema as well as some mild warmth to touch. This does have been concerned about the possibility of cellulitis although within the differential diagnosis I do think that potentially a DVT has to be at least considered. We need  to rule that out before proceeding would just call in the cellulitis. Especially since he is having pain in the posterior aspect of his calf muscle. 2/8; the patient had seen sparingly. He has severe skin changes of chronic lymphedema in the left leg thickened hyperkeratotic verrucous skin. He has an open wound on the medial part of the left first met head left mid tibia. He also has a rim of nonepithelialized skin in the anterior mid tibia. He brought in the AmLactin lotion that was been prescribed although I am not sure under compression and its utility. There concern about cellulitis on the right lower leg the last time he was here. He was put on on antibiotics. His DVT rule out was negative. The right leg looks fine he is using his stocking on this area 08/10/2020 upon evaluation today patient appears to be doing well with regard to his leg currently. He has been tolerating the dressing changes without complication. Fortunately there is no signs of active infection which is great news. Overall very pleased with where things stand. 2/22; the patient still has an area on the medial part of the left first met his head. This looks better than when I last saw this earlier this month he has a rim of epithelialization but still some surface debris. Mostly everything on the left leg is healed. There is still a vulnerable in the left mid tibia  area. 08/30/2020 upon evaluation today patient appears to be doing much better in regard to his wounds on his foot. 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 Nonetheless I think we will address that today I Minna send in a prescription for him in that regard. Overall though there does not appear to be any signs of significant worsening. 09/07/2020 on evaluation today patient's wounds over his left foot appear to be doing excellent. I do not see any signs of infection there is some callus buildup this can require debridement for certain but overall I feel like he is managing quite nicely. He still using the AmLactin cream which has been beneficial for him as well. 3/22; left foot wound is closed. There is no open area here. He is using ammonium lactate lotion to the lower extremities to help exfoliate dry cracked skin. He has compression stockings from elastic therapy in [place]. The wound on the medial part of his left first met head is healed today. READMISSION 04/12/2021 [name] is a patient we know fairly well he had a prolonged stay in clinic in 2019 with wounds on his left lateral and left anterior lower extremity in the setting of chronic venous insufficiency. More recently he was here earlier this year with predominantly an area on his left foot first metatarsal head plantar and he says the plantar foot broke down on its not long after we discharged him but he did not come back here. The last few months areas of broken down on his left anterior and again the left lateral lower extremity. The leg itself is very swollen chronically enlarged a lot of hyperkeratotic dry Berry Q skin in the left lower leg. His edema extends well into the thigh. He was seen by Dr. Donzetta Matters. He had ABIs on 03/02/2021 showing an ABI on the right of 1 with a TBI of 0.72 his ABI in the  left at 1.09 TBI of 0.99. Monophasic  and NESHAWN, AIRD ([id number]) [id number] Page 11 of 18 biphasic waveforms on the right. On the left monophasic waveforms were noted he went on to have an angiogram on 03/27/2021 this showed the aortic aortic and iliac segments were free of flow-limiting stenosis the left common femoral vein to evaluate the left femoral to anterior tibial artery bypass was unobstructed the bypass was patent without any areas of stenosis. We discharged the patient in bilateral juxta lite stockings but very clearly that was not sufficient to control the swelling and maintain skin integrity. He is clearly going to need compression pumps. 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 This is fortunate because he is on his feet for long periods of time. 10/27; patient comes in with our intake nurse reporting copious amount of green drainage from the left anterior mid tibia the left dorsal foot and to a lesser extent the left medial mid tibia. We left the compression wrap on all week for the amount of edema in his left leg is quite a bit better. We use silver alginate as the primary dressing 11/3; edema control is good. Left anterior lower leg left medial lower leg and the plantar first metatarsal head. The left anterior lower leg required debridement. Deep tissue culture I did of this wound showed MRSA I put him on 10 days of doxycycline which she will start today. We have him in compression wraps. He has a security card and [employer] however he is retiring on November 15. We will need to then get him into a better offloading boot for the left foot perhaps a total contact cast 11/10; edema control is quite good. Left anterior and left medial lower leg wounds in the setting of chronic venous insufficiency and lymphedema. He also has a substantial area over the left plantar first metatarsal head. I treated him for  MRSA that we identified on the major wound on the left anterior mid tibia with doxycycline and gentamicin topically. He has significant hypergranulation on the left plantar foot wound. The patient is a diabetic but he does not have significant PAD 11/17; edema control is quite good. Left anterior and left medial lower leg wounds look better. The really concerning area remains the area on the left plantar first metatarsal head. He has a rim of epithelialization. 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 Starting today with a forefoot off loader but . possibly a total contact cast. 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 Arrives with a very significant odor from I think all of the wounds on the left leg and the left foot. Because of this I did not put a total contact cast on him today but will could still consider this. His wife was having cataract surgery which is the reason he missed the appointment 12/6. I saw this man 5 days ago with a swelling below the popliteal fossa. I thought he actually might have a Baker's cyst however the DVT rule out study that we could arrange right away was negative the technician told me this was not a ruptured Baker's cyst. We attempted to get this aspirated by under ultrasound guidance in  interventional radiology however all they did was an ultrasound however it shows an extensive fluid collection 62 x 8 x 9.4 in the left thigh and left calf. The patient states he thinks this started 8 days ago or so but he really is not complaining of any pain, fever or systemic symptoms. He has not ha 12/20; after some difficulty I managed to get the patient into see Dr. Donzetta Matters. Eventually he was taken into the hospital and had a drain put in the fluid  collection below his left knee posteriorly extending into the posterior thigh. He still has the drain in place. Culture of this showed moderate staff aureus few Morganella and few Klebsiella he is now on doxycycline and ciprofloxacin as suggested by infectious disease he is on this for a month. The drain will remain in place until it stops draining 12/29; he comes in today with the 1 wound on his left leg and the area on the left plantar first met head significantly smaller. Both look healthy. He still has the drain in the left leg. He says he has to change this daily. Follows up with Dr. Donzetta Matters on January 11. 06/29/2021; the wounds that I am following on the left leg and left first met head continued to be quite healthy. However the area where his inferior drain is in place had copious amounts of drainage which was green in color. The wound here is larger. Follows up with Dr. Gwenlyn Saran of vein and vascular his surgeon next week as well as infectious disease. He remains on ciprofloxacin and doxycycline. He is not complaining of excessive pain in either one of the drain areas 1/12; the patient saw vascular surgery and infectious disease. Vascular surgery has left the drain in place as there was still some notable drainage still see him back in 2 weeks. Dr. Velna Ochs stop the doxycycline and ciprofloxacin and I do not believe he follows up with them at this point. Culture I did last week showed both doxycycline resistant MRSA and Pseudomonas not sensitive to ciprofloxacin although only in rare titers 1/19; the patient's wound on the left anterior lower leg is just about healed. We have continued healing of the area that was medially on the left leg. Left first plantar metatarsal head continues to get smaller. The major problem here is his 2 drain sites 1 on the left upper calf and lateral thigh. There is purulent drainage still from the left lateral thigh. I gave him antibiotics last week but we still have  recultured. He has the drain in the area I think this is eventually going to have to come out. I suspect there will be a connecting wound to heal here perhaps with improved VAc 1/26; the patient had his drain removed by vein and vascular on 1/25/. This was a large pocket of fluid in his left thigh that seem to tunnel into his left upper calf. 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 His mention his Penrose drain was removed today. He now has a tunneling wound on his left calf and left thigh. Both of these probe widely towards each other although I cannot really prove that they connect. Both wounds on his lower leg anteriorly are closed and his area over the first metatarsal head on his right foot continues to improve. We are using Hydrofera Blue here. He also saw infectious disease culture of the abscess they noted was polymicrobial with MRSA, Morganella and Klebsiella he was treated with doxycycline and  ciprofloxacin for 4 weeks ending on 07/03/2021. They did not recommend any further antibiotics. Notable that while he still had the Penrose drain in place last week he had purulent drainage coming out of the inferior IandD site this grew [facility], MRSA and Pseudomonas but there does not appear to be any active infection in this area today with the drain out and he is not systemically unwell 2/2; with regards to the drain sites the superior one on the thigh actually is closed down the one on the upper left lateral calf measures about 8 and half centimeters which is an improvement seems to be less prominent although still with a lot of drainage. The only remaining wound is over the first metatarsal head on the left foot and this looks to be continuing to improve with Hydrofera Blue. 2/9; the area on his plantar left foot continues to contract. Callus around the wound edge. The drain sites specifically have not come down in depth. We put the wound VAC on Monday he changed the  canister late last night our intake nurse reported a pocket of fluid perhaps caused by our compression wraps 2/16; continued improvement in left foot plantar wound. drainage site in the calf is not improved in terms of depth (wound vac) 2/23; continued improvement in the left foot wound over the first metatarsal head. With regards to the drain sites the area on his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

## 2022-06-02 ENCOUNTER — Encounter: Payer: Self-pay | Admitting: Nurse Practitioner

## 2022-06-02 NOTE — Progress Notes (Signed)
[name], [name] ([id number]) [id number] Page 1 of 10 Visit Report for 06/01/2022 Arrival Information Details Patient Name: Date of Service: [name], [name] 06/01/2022 10:30 A M Medical Record Number: [id number] Patient Account Number: [id number] Date of Birth/Sex: Treating RN: 1950/11/08 (71 y.o. [name] Primary Care Trisa Cranor: [name] Other Clinician: Referring Ova Meegan: Treating Rashidi Loh/Extender: [name] in Treatment: 86 Visit Information History Since Last Visit Added or deleted any medications: No Patient Arrived: Ambulatory Any new allergies or adverse reactions: No Arrival Time: 10:47 Had a fall or experienced change in No Accompanied By: self activities of daily living that may affect Transfer Assistance: None risk of falls: Patient Identification Verified: Yes Signs or symptoms of abuse/neglect since last visito No Secondary Verification Process Completed: Yes Hospitalized since last visit: No Patient Requires Transmission-Based Precautions: No Implantable device outside of the clinic excluding No Patient Has Alerts: Yes cellular tissue based products placed in the center since last visit: Has Dressing in Place as Prescribed: Yes Has Footwear/Offloading in Place as Prescribed: Yes Left: T Contact Cast otal Pain Present Now: No Electronic Signature(s) Signed: 06/01/2022 4:13:22 PM By: Adline Peals Entered By: Adline Peals on 06/01/2022 10:47:48 -------------------------------------------------------------------------------- Encounter Discharge Information Details Patient Name: Date of Service: [name]. 06/01/2022 10:30 A M Medical Record Number: [id number] Patient Account Number: [id number] Date of Birth/Sex: Treating RN: 1951-02-12 (71 y.o. [name] Primary Care Najmo Pardue: [name] Other Clinician: Referring Patrycja Mumpower: Treating Verdell Kincannon/Extender: [name] in Treatment: 25 Encounter Discharge Information Items Post Procedure Vitals Discharge Condition: Stable Temperature (F): 97.3 Ambulatory Status: Ambulatory Pulse (bpm): 66 Discharge Destination: Home Respiratory Rate (breaths/min): 18 Transportation: Private Auto Blood Pressure (mmHg): 154/83 Accompanied By: self Schedule Follow-up Appointment: Yes Clinical Summary of Care: Patient Declined Electronic Signature(s) Signed: 06/01/2022 12:01:03 PM By: Dellie Catholic RN Entered By: Dellie Catholic on 06/01/2022 11:57:20 [name] ([id number]) [id number] Page 2 of 10 -------------------------------------------------------------------------------- Lower Extremity Assessment Details Patient Name: Date of Service: [name] 06/01/2022 10:30 A M Medical Record Number: [id number] Patient Account Number: [id number] Date of Birth/Sex: Treating RN: 1951/04/17 (71 y.o. [name] Primary Care Jaedan Huttner: [name] Other Clinician: Referring Avrohom Mckelvin: Treating Lavere Shinsky/Extender: [name] in Treatment: 59 Edema Assessment Assessed: [name]: No] [name]: No] Edema: [Left: Ye] [Right: s] Calf Left: Right: Point of Measurement: 41 cm From Medial Instep 46 cm Ankle Left: Right: Point of Measurement: 10 cm From Medial Instep 29 cm Vascular Assessment Pulses: Dorsalis Pedis Palpable: [Left:Yes] Electronic Signature(s) Signed: 06/01/2022 12:01:03 PM By: Dellie Catholic RN Signed: 06/01/2022 4:13:22 PM By: Adline Peals Entered By: Dellie Catholic on 06/01/2022 10:57:29 -------------------------------------------------------------------------------- Multi Wound Chart Details Patient Name: Date of Service: [name]. 06/01/2022 10:30 A M Medical Record Number: [id number] Patient Account Number: [id number] Date of Birth/Sex: Treating RN: [birth year] (71 y.o. M) Primary Care Modesto Ganoe:  [name] Other Clinician: Referring Tameisha Covell: Treating Carsyn Boster/Extender: [name] in Treatment: 37 Vital Signs Height(in): 74 Pulse(bpm): 66 Weight(lbs): 238 Blood Pressure(mmHg): 154/83 Body Mass Index(BMI): 30.6 Temperature(F): 97.3 Respiratory Rate(breaths/min): 18 [22:Photos:] Left, Proximal, Lateral Lower Leg Left, Medial Foot Left, Anterior Lower Leg Wound Location: [name], [name] ([id number]) [id number] Page 3 of 10 Bump Blister Pressure Injury Wounding Event: Cyst Diabetic Wound/Ulcer of the Lower Diabetic Wound/Ulcer of the Lower Primary Etiology: Extremity Extremity Glaucoma, Sleep Apnea, Glaucoma, Sleep Apnea, Glaucoma, Sleep Apnea, Comorbid History: Hypertension, Peripheral Arterial Hypertension, Peripheral Arterial Hypertension, Peripheral Arterial  Disease, Peripheral Venous Disease, Disease, Peripheral Venous Disease, Disease, Peripheral Venous Disease, Type II Diabetes, Gout, Osteoarthritis, Type II Diabetes, Gout, Osteoarthritis, Type II Diabetes, Gout, Osteoarthritis, Neuropathy Neuropathy Neuropathy 06/03/2021 02/01/2022 05/04/2022 Date Acquired: 48 17 4 Weeks of Treatment: Open Open Healed - Epithelialized Wound Status: No Yes No Wound Recurrence: Yes No No Clustered Wound: 3 N/A N/A Clustered Quantity: 4.9x1.3x0.1 0.3x0.3x0.1 0x0x0 Measurements L x W x D (cm) 5.003 0.071 0 A (cm) : rea 0.5 0.007 0 Volume (cm) : -203.40% 93.00% 100.00% % Reduction in A rea: 62.10% 93.10% 100.00% % Reduction in Volume: Full Thickness With Exposed Support Grade 1 Grade 1 Classification: Structures Medium Medium None Present Exudate A mount: Serous Serous N/A Exudate Type: amber amber N/A Exudate Color: Fibrotic scar, thickened scar Distinct, outline attached Flat and Intact Wound Margin: Medium (34-66%) Large (67-100%) None Present (0%) Granulation A mount: Pink Red N/A Granulation  Quality: Medium (34-66%) Small (1-33%) None Present (0%) Necrotic A mount: Eschar, Adherent Slough Adherent Slough N/A Necrotic Tissue: Fat Layer (Subcutaneous Tissue): Yes Fat Layer (Subcutaneous Tissue): Yes Fascia: No Exposed Structures: Fascia: No Fascia: No Fat Layer (Subcutaneous Tissue): No Tendon: No Tendon: No Tendon: No Muscle: No Muscle: No Muscle: No Joint: No Joint: No Joint: No Bone: No Bone: No Bone: No Small (1-33%) Medium (34-66%) Large (67-100%) Epithelialization: Debridement - Excisional N/A N/A Debridement: Pre-procedure Verification/Time Out 11:12 N/A N/A Taken: Lidocaine 5% topical ointment N/A N/A Pain Control: Subcutaneous, Slough N/A N/A Tissue Debrided: Skin/Subcutaneous Tissue N/A N/A Level: 6.37 N/A N/A Debridement A (sq cm): rea Curette N/A N/A Instrument: Minimum N/A N/A Bleeding: Pressure N/A N/A Hemostasis A chieved: 0 N/A N/A Procedural Pain: 0 N/A N/A Post Procedural Pain: Procedure was tolerated well N/A N/A Debridement Treatment Response: 4.9x1.3x0.1 N/A N/A Post Debridement Measurements L x W x D (cm) 0.5 N/A N/A Post Debridement Volume: (cm) Scarring: Yes Callus: Yes Excoriation: No Periwound Skin Texture: Induration: No Callus: No Crepitus: No Rash: No Scarring: No Dry/Scaly: Yes Maceration: Yes Maceration: No Periwound Skin Moisture: Dry/Scaly: No Dry/Scaly: No Hemosiderin Staining: Yes No Abnormalities Noted Atrophie Blanche: No Periwound Skin Color: Cyanosis: No Ecchymosis: No Erythema: No Hemosiderin Staining: No Mottled: No Pallor: No Rubor: No No Abnormality No Abnormality No Abnormality Temperature: Yes N/A N/A Tenderness on Palpation: Debridement T Contact Cast otal N/A Procedures Performed: Treatment Notes Electronic Signature(s) Signed: 06/01/2022 11:34:05 AM By: Fredirick Maudlin MD FACS Entered By: Fredirick Maudlin on 06/01/2022 11:34:05 [name] ([id number])  [id number] Page 4 of 10 -------------------------------------------------------------------------------- Multi-Disciplinary Care Plan Details Patient Name: Date of Service: [name], [name] 06/01/2022 10:30 A M Medical Record Number: [id number] Patient Account Number: [id number] Date of Birth/Sex: Treating RN: 12/04/1950 (71 y.o. [name] Primary Care [name] [name]: [name] Other Clinician: Referring Anup Brigham: Treating Moria Brophy/Extender: [name] in Treatment: 30 Multidisciplinary Care Plan reviewed with physician Active Inactive Venous Leg Ulcer Nursing Diagnoses: Knowledge deficit related to disease process and management Potential for venous Insuffiency (use before diagnosis confirmed) Goals: Patient will maintain optimal edema control Date Initiated: 07/27/2021 Target Resolution Date: 09/23/2022 Goal Status: Active Interventions: Assess peripheral edema status every visit. Treatment Activities: Therapeutic compression applied : 07/27/2021 Notes: Wound/Skin Impairment Nursing Diagnoses: Impaired tissue integrity Knowledge deficit related to ulceration/compromised skin integrity Goals: Patient will have a decrease in wound volume by X% from date: (specify in notes) Date Initiated: 04/12/2021 Date Inactivated: 01/04/2022 Target Resolution Date: 04/23/2021 Goal Status: Met Patient/caregiver will verbalize understanding of skin care regimen  Date Initiated: 01/04/2022 Target Resolution Date: 09/23/2022 Goal Status: Active Ulcer/skin breakdown will have a volume reduction of 30% by week 4 Date Initiated: 04/12/2021 Date Inactivated: 04/27/2021 Target Resolution Date: 04/27/2021 Goal Status: Unmet Unmet Reason: infection Ulcer/skin breakdown will have a volume reduction of 50% by week 8 Date Initiated: 04/27/2021 Date Inactivated: 06/29/2021 Target Resolution Date: 06/24/2021 Goal Status: Met Interventions: Assess  patient/caregiver ability to obtain necessary supplies Assess patient/caregiver ability to perform ulcer/skin care regimen upon admission and as needed Assess ulceration(s) every visit Notes: Electronic Signature(s) Signed: 06/01/2022 4:13:22 PM By: Adline Peals Entered By: Adline Peals on 06/01/2022 10:49:17 [name] ([id number]) [id number] Page 5 of 10 -------------------------------------------------------------------------------- Pain Assessment Details Patient Name: Date of Service: [name], [name] 06/01/2022 10:30 A M Medical Record Number: [id number] Patient Account Number: [id number] Date of Birth/Sex: Treating RN: 1950/11/17 (71 y.o. [name] Primary Care Shana Zavaleta: [name] Other Clinician: Referring Nashali Ditmer: Treating Relda Agosto/Extender: [name] in Treatment: 36 Active Problems Location of Pain Severity and Description of Pain Patient Has Paino No Site Locations Rate the pain. Current Pain Level: 0 Pain Management and Medication Current Pain Management: Electronic Signature(s) Signed: 06/01/2022 4:13:22 PM By: Adline Peals Entered By: Adline Peals on 06/01/2022 10:48:16 -------------------------------------------------------------------------------- Patient/Caregiver Education Details Patient Name: Date of Service: [name] 12/8/2023andnbsp10:30 A M Medical Record Number: [id number] Patient Account Number: [id number] Date of Birth/Gender: Treating RN: 10/13/1950 (71 y.o. [name] Primary Care Physician: [name] Other Clinician: Referring Physician: Treating Physician/Extender: [name] in Treatment: 44 Education Assessment Education Provided To: Patient Education Topics Provided Offloading: Methods: Explain/Verbal Responses: Reinforcements needed, State content correctly RAEDYN, WENKE ([id number])  [id number] Page 6 of 10 Electronic Signature(s) Signed: 06/01/2022 4:13:22 PM By: Adline Peals Entered By: Adline Peals on 06/01/2022 10:49:32 -------------------------------------------------------------------------------- Wound Assessment Details Patient Name: Date of Service: [name], [name] 06/01/2022 10:30 A M Medical Record Number: [id number] Patient Account Number: [id number] Date of Birth/Sex: Treating RN: 1950-08-09 (71 y.o. [name] Primary Care Patrena Santalucia: [name] Other Clinician: Referring Koltyn Kelsay: Treating Jinnie Onley/Extender: [name] in Treatment: 59 Wound Status Wound Number: 22 Primary Cyst Etiology: Wound Location: Left, Proximal, Lateral Lower Leg Wound Open Wounding Event: Bump Status: Date Acquired: 06/03/2021 Comorbid Glaucoma, Sleep Apnea, Hypertension, Peripheral Arterial Disease, Weeks Of Treatment: 51 History: Peripheral Venous Disease, Type II Diabetes, Gout, Osteoarthritis, Clustered Wound: Yes Neuropathy Photos Wound Measurements Length: (cm) Width: (cm) Depth: (cm) Clustered Quantity: Area: (cm) Volume: (cm) 4.9 % Reduction in Area: -203.4% 1.3 % Reduction in Volume: 62.1% 0.1 Epithelialization: Small (1-33%) 3 Tunneling: No 5.003 Undermining: No 0.5 Wound Description Classification: Full Thickness With Exposed Support Structures Wound Margin: Fibrotic scar, thickened scar Exudate Amount: Medium Exudate Type: Serous Exudate Color: amber Foul Odor After Cleansing: No Slough/Fibrino No Wound Bed Granulation Amount: Medium (34-66%) Exposed Structure Granulation Quality: Pink Fascia Exposed: No Necrotic Amount: Medium (34-66%) Fat Layer (Subcutaneous Tissue) Exposed: Yes Necrotic Quality: Eschar, Adherent Slough Tendon Exposed: No Muscle Exposed: No Joint Exposed: No Bone Exposed: No Periwound Skin Texture Texture Color No Abnormalities Noted: No No  Abnormalities Noted: No Scarring: Yes Hemosiderin Staining: Yes [name], [name] ([id number]) [id number] Page 7 of 10 Moisture Temperature / Pain No Abnormalities Noted: No Temperature: No Abnormality Dry / Scaly: Yes Tenderness on Palpation: Yes Treatment Notes Wound #22 (Lower Leg) Wound Laterality: Left, Lateral, Proximal Cleanser Soap and Water Discharge Instruction: May shower and wash wound with dial antibacterial  soap and water prior to dressing change. Wound Cleanser Discharge Instruction: Cleanse the wound with wound cleanser prior to applying a clean dressing using gauze sponges, not tissue or cotton balls. Peri-Wound Care Sween Lotion (Moisturizing lotion) Discharge Instruction: Apply moisturizing lotion to the leg Topical Skintegrity Hydrogel 4 (oz) Discharge Instruction: Apply hydrogel as directed Primary Dressing Endoform 2x2 in Discharge Instruction: Moisten with Hydrogel or saline Secondary Dressing ABD Pad, 8x10 Discharge Instruction: Apply over primary dressing as directed. Secured With Elastic Bandage 4 inch (ACE bandage) Discharge Instruction: Secure with ACE bandage as directed. Compression Wrap Compression Stockings Add-Ons Electronic Signature(s) Signed: 06/01/2022 12:01:03 PM By: Dellie Catholic RN Entered By: Dellie Catholic on 06/01/2022 11:07:23 -------------------------------------------------------------------------------- Wound Assessment Details Patient Name: Date of Service: [name]. 06/01/2022 10:30 A M Medical Record Number: [id number] Patient Account Number: [id number] Date of Birth/Sex: Treating RN: Nov 12, 1950 (71 y.o. [name] Primary Care Xsavier Seeley: [name] Other Clinician: Referring Wray Goehring: Treating Bryant Saye/Extender: [name] in Treatment: 59 Wound Status Wound Number: 40N Primary Diabetic Wound/Ulcer of the Lower Extremity Etiology: Wound Location: Left,  Medial Foot Wound Open Wounding Event: Blister Status: Date Acquired: 02/01/2022 Comorbid Glaucoma, Sleep Apnea, Hypertension, Peripheral Arterial Disease, Weeks Of Treatment: 17 History: Peripheral Venous Disease, Type II Diabetes, Gout, Osteoarthritis, Clustered Wound: No Neuropathy Photos [name], [name] ([id number]) [id number] Page 8 of 10 Wound Measurements Length: (cm) 0.3 Width: (cm) 0.3 Depth: (cm) 0.1 Area: (cm) 0.071 Volume: (cm) 0.007 % Reduction in Area: 93% % Reduction in Volume: 93.1% Epithelialization: Medium (34-66%) Tunneling: No Undermining: No Wound Description Classification: Grade 1 Wound Margin: Distinct, outline attached Exudate Amount: Medium Exudate Type: Serous Exudate Color: amber Foul Odor After Cleansing: No Slough/Fibrino No Wound Bed Granulation Amount: Large (67-100%) Exposed Structure Granulation Quality: Red Fascia Exposed: No Necrotic Amount: Small (1-33%) Fat Layer (Subcutaneous Tissue) Exposed: Yes Necrotic Quality: Adherent Slough Tendon Exposed: No Muscle Exposed: No Joint Exposed: No Bone Exposed: No Periwound Skin Texture Texture Color No Abnormalities Noted: No No Abnormalities Noted: Yes Callus: Yes Temperature / Pain Temperature: No Abnormality Moisture No Abnormalities Noted: No Dry / Scaly: No Maceration: Yes Treatment Notes Wound #27R (Foot) Wound Laterality: Left, Medial Cleanser Peri-Wound Care Topical Primary Dressing KerraCel Ag Gelling Fiber Dressing, 2x2 in (silver alginate) Discharge Instruction: Apply silver alginate to wound bed as instructed Secondary Dressing Optifoam Non-Adhesive Dressing, 4x4 in Discharge Instruction: Apply over primary dressing cut to make foam donut Woven Gauze Sponge, Non-Sterile 4x4 in Discharge Instruction: Apply over primary dressing as directed. Secured With 42M Medipore H Soft Cloth Surgical T ape, 4 x 10 (in/yd) Discharge Instruction: Secure  with tape as directed. Compression Wrap Compression Stockings Add-Ons TCC [name], [name] ([id number]) [id number] Page 9 of 10 Discharge Instruction: left foot/leg Electronic Signature(s) Signed: 06/01/2022 12:01:03 PM By: Dellie Catholic RN Entered By: Dellie Catholic on 06/01/2022 11:08:08 -------------------------------------------------------------------------------- Wound Assessment Details Patient Name: Date of Service: [name]. 06/01/2022 10:30 A M Medical Record Number: [id number] Patient Account Number: [id number] Date of Birth/Sex: Treating RN: 10/27/1950 (71 y.o. [name] Primary Care Bentley Fissel: [name] Other Clinician: Referring Enez Monahan: Treating Janis Cuffe/Extender: [name] in Treatment: 59 Wound Status Wound Number: 28 Primary Diabetic Wound/Ulcer of the Lower Extremity Etiology: Wound Location: Left, Anterior Lower Leg Wound Healed - Epithelialized Wounding Event: Pressure Injury Status: Date Acquired: 05/04/2022 Comorbid Glaucoma, Sleep Apnea, Hypertension, Peripheral Arterial Disease, Weeks Of Treatment: 4 History: Peripheral Venous Disease, Type II Diabetes, Gout, Osteoarthritis,  Clustered Wound: No Neuropathy Photos Wound Measurements Length: (cm) Width: (cm) Depth: (cm) Area: (cm) Volume: (cm) 0 % Reduction in Area: 100% 0 % Reduction in Volume: 100% 0 Epithelialization: Large (67-100%) 0 Tunneling: No 0 Undermining: No Wound Description Classification: Grade 1 Wound Margin: Flat and Intact Exudate Amount: None Present Foul Odor After Cleansing: No Slough/Fibrino No Wound Bed Granulation Amount: None Present (0%) Exposed Structure Necrotic Amount: None Present (0%) Fascia Exposed: No Fat Layer (Subcutaneous Tissue) Exposed: No Tendon Exposed: No Muscle Exposed: No Joint Exposed: No Bone Exposed: No Periwound Skin Texture Texture Color No Abnormalities Noted:  Yes No Abnormalities Noted: Yes Moisture Temperature / Pain No Abnormalities Noted: No Temperature: No Abnormality Dry / Scaly: No [name], [name] ([id number]) [id number] Page 10 of 10 Maceration: No Electronic Signature(s) Signed: 06/01/2022 12:01:03 PM By: Dellie Catholic RN Entered By: Dellie Catholic on 06/01/2022 11:16:53 -------------------------------------------------------------------------------- Vitals Details Patient Name: Date of Service: [name]. 06/01/2022 10:30 A M Medical Record Number: [id number] Patient Account Number: [id number] Date of Birth/Sex: Treating RN: 1950-11-27 (71 y.o. [name] Primary Care Kashauna Celmer: [name] Other Clinician: Referring Clemente Dewey: Treating Amario Longmore/Extender: [name] in Treatment: 11 Vital Signs Time Taken: 10:47 Temperature (F): 97.3 Height (in): 74 Pulse (bpm): 66 Weight (lbs): 238 Respiratory Rate (breaths/min): 18 Body Mass Index (BMI): 30.6 Blood Pressure (mmHg): 154/83 Reference Range: 80 - 120 mg / dl Electronic Signature(s) Signed: 06/01/2022 4:13:22 PM By: Adline Peals Entered By: Adline Peals on 06/01/2022 10:48:09

## 2022-06-04 MED ORDER — DEXCOM G7 SENSOR MISC
1.0000 | 3 refills | Status: DC
Start: 2022-06-04 — End: 2023-02-22

## 2022-06-08 ENCOUNTER — Encounter (HOSPITAL_BASED_OUTPATIENT_CLINIC_OR_DEPARTMENT_OTHER): Payer: Medicare Other | Admitting: Internal Medicine

## 2022-06-08 DIAGNOSIS — E11621 Type 2 diabetes mellitus with foot ulcer: Secondary | ICD-10-CM | POA: Diagnosis not present

## 2022-06-09 NOTE — Progress Notes (Signed)
[name], [name] ([id number]) [id number] Page 1 of 10 Visit Report for 06/08/2022 Arrival Information Details Patient Name: Date of Service: [name], [name] 06/08/2022 11:00 A M Medical Record Number: [id number] Patient Account Number: [id number] Date of Birth/Sex: Treating RN: 07-08-1950 (71 y.o. M) Primary Care Alphia Behanna: [name] Other Clinician: Referring Madelyn Tlatelpa: Treating Deaundra Dupriest/Extender: Stormy Card in Treatment: 74 Visit Information History Since Last Visit All ordered tests and consults were completed: No Patient Arrived: Ambulatory Added or deleted any medications: No Arrival Time: 11:17 Any new allergies or adverse reactions: No Accompanied By: self Had a fall or experienced change in No Transfer Assistance: None activities of daily living that may affect Patient Identification Verified: Yes risk of falls: Secondary Verification Process Completed: Yes Signs or symptoms of abuse/neglect since last visito No Patient Requires Transmission-Based Precautions: No Hospitalized since last visit: No Patient Has Alerts: Yes Implantable device outside of the clinic excluding No cellular tissue based products placed in the center since last visit: Pain Present Now: No Electronic Signature(s) Signed: 06/08/2022 11:57:43 AM By: Worthy Rancher Entered By: Worthy Rancher on 06/08/2022 11:17:36 -------------------------------------------------------------------------------- Compression Therapy Details Patient Name: Date of Service: [name], [name] 06/08/2022 11:00 A M Medical Record Number: [id number] Patient Account Number: [id number] Date of Birth/Sex: Treating RN: 1951-06-23 (71 y.o. [name] Primary Care Delorese Sellin: [name] Other Clinician: Referring Derric Dealmeida: Treating Helane Briceno/Extender: Stormy Card in Treatment: 60 Compression Therapy Performed for Wound Assessment: Wound #29 Left,Anterior  Lower Leg Performed By: Clinician Dellie Catholic, RN Compression Type: Three Layer Post Procedure Diagnosis Same as Pre-procedure Electronic Signature(s) Signed: 06/08/2022 6:13:17 PM By: Dellie Catholic RN Entered By: Dellie Catholic on 06/08/2022 12:13:45 [name] ([id number]) [id number] Page 2 of 10 -------------------------------------------------------------------------------- Encounter Discharge Information Details Patient Name: Date of Service: [name], [name] 06/08/2022 11:00 A M Medical Record Number: [id number] Patient Account Number: [id number] Date of Birth/Sex: Treating RN: 06/05/1951 (71 y.o. [name] Primary Care Shima Compere: [name] Other Clinician: Referring Delona Clasby: Treating Trayven Lumadue/Extender: Stormy Card in Treatment: 25 Encounter Discharge Information Items Discharge Condition: Stable Ambulatory Status: Ambulatory Discharge Destination: Home Transportation: Private Auto Accompanied By: self Schedule Follow-up Appointment: Yes Clinical Summary of Care: Patient Declined Electronic Signature(s) Signed: 06/08/2022 6:13:17 PM By: Dellie Catholic RN Entered By: Dellie Catholic on 06/08/2022 18:03:30 -------------------------------------------------------------------------------- Lower Extremity Assessment Details Patient Name: Date of Service: [name], [name] 06/08/2022 11:00 A M Medical Record Number: [id number] Patient Account Number: [id number] Date of Birth/Sex: Treating RN: 10-04-1950 (71 y.o. [name] Primary Care Deangleo Passage: [name] Other Clinician: Referring Fahmida Jurich: Treating Lynix Bonine/Extender: Stormy Card in Treatment: 60 Edema Assessment Assessed: [name]: No] [name]: No] Edema: [Left: Ye] [Right: s] Calf Left: Right: Point of Measurement: 41 cm From Medial Instep 46 cm Ankle Left: Right: Point of Measurement: 10 cm From Medial Instep 29  cm Vascular Assessment Pulses: Dorsalis Pedis Palpable: [Left:Yes] Electronic Signature(s) Signed: 06/08/2022 6:13:17 PM By: Dellie Catholic RN Entered By: Dellie Catholic on 06/08/2022 11:53:54 Multi Wound Chart Details -------------------------------------------------------------------------------- [name] ([id number]) [id number] Page 3 of 10 Patient Name: Date of Service: [name], [name] 06/08/2022 11:00 A M Medical Record Number: [id number] Patient Account Number: [id number] Date of Birth/Sex: Treating RN: Dec 26, 1950 (71 y.o. M) Primary Care Glendora Clouatre: [name] Other Clinician: Referring Helayna Dun: Treating Eirik Schueler/Extender: Stormy Card in Treatment: 60 Vital Signs Height(in): 74 Capillary Blood Glucose(mg/dl): 238 Weight(lbs): 238 Pulse(bpm): 66 Body Mass  Index(BMI): 30.6 Blood Pressure(mmHg): 127/76 Temperature(F): 98.2 Respiratory Rate(breaths/min): 20 Wound Assessments Wound Number: [id number] Photos: Left, Proximal, Lateral Lower Leg Left, Medial Foot Left, Anterior Lower Leg Wound Location: Bump Blister Gradually Appeared Wounding Event: Cyst Diabetic Wound/Ulcer of the Lower Abrasion Primary Etiology: Extremity Glaucoma, Sleep Apnea, Glaucoma, Sleep Apnea, Glaucoma, Sleep Apnea, Comorbid History: Hypertension, Peripheral Arterial Hypertension, Peripheral Arterial Hypertension, Peripheral Arterial Disease, Peripheral Venous Disease, Disease, Peripheral Venous Disease, Disease, Peripheral Venous Disease, Type II Diabetes, Gout, Osteoarthritis, Type II Diabetes, Gout, Osteoarthritis, Type II Diabetes, Gout, Osteoarthritis, Neuropathy Neuropathy Neuropathy 06/03/2021 02/01/2022 06/04/2022 Date Acquired: 52 18 0 Weeks of Treatment: Open Healed - Epithelialized Open Wound Status: No Yes No Wound Recurrence: Yes No No Clustered Wound: 3 N/A N/A Clustered Quantity: 4.8x1.3x0.1 0x0x0  2.5x1.6x0.1 Measurements L x W x D (cm) 4.901 0 3.142 A (cm) : rea 0.49 0 0.314 Volume (cm) : -197.20% 100.00% N/A % Reduction in Area: 62.90% 100.00% N/A % Reduction in Volume: Full Thickness With Exposed Support Grade 1 Full Thickness Without Exposed Classification: Structures Support Structures Medium None Present Medium Exudate A mount: Serous N/A Serosanguineous Exudate Type: amber N/A red, brown Exudate Color: Fibrotic scar, thickened scar Distinct, outline attached N/A Wound Margin: Medium (34-66%) None Present (0%) Large (67-100%) Granulation Amount: Pink N/A Red Granulation Quality: Medium (34-66%) None Present (0%) N/A Necrotic Amount: Eschar, Adherent Slough N/A N/A Necrotic Tissue: Fat Layer (Subcutaneous Tissue): Yes Fascia: No Fat Layer (Subcutaneous Tissue): Yes Exposed Structures: Fascia: No Fat Layer (Subcutaneous Tissue): No Fascia: No Tendon: No Tendon: No Tendon: No Muscle: No Muscle: No Muscle: No Joint: No Joint: No Joint: No Bone: No Bone: No Bone: No Small (1-33%) Large (67-100%) N/A Epithelialization: Scarring: Yes Callus: Yes Scarring: Yes Periwound Skin Texture: Dry/Scaly: Yes Maceration: Yes Dry/Scaly: Yes Periwound Skin Moisture: Dry/Scaly: No Hemosiderin Staining: Yes No Abnormalities Noted Hemosiderin Staining: Yes Periwound Skin Color: No Abnormality No Abnormality No Abnormality Temperature: Yes N/A N/A Tenderness on Palpation: N/A N/A Compression Therapy Procedures Performed: Treatment Notes Electronic Signature(s) Signed: 06/08/2022 4:40:59 PM By: Linton Ham MD Entered By: Linton Ham on 06/08/2022 13:01:09 [name] ([id number]) [id number] Page 4 of 10 -------------------------------------------------------------------------------- Multi-Disciplinary Care Plan Details Patient Name: Date of Service: BUELL, PARCEL 06/08/2022 11:00 A M Medical Record Number:  [id number] Patient Account Number: [id number] Date of Birth/Sex: Treating RN: [birth year] (71 y.o. [name] Primary Care Giuseppe Duchemin: [name] Other Clinician: Referring Damarea Merkel: Treating Codie Krogh/Extender: Stormy Card in Treatment: 29 Multidisciplinary Care Plan reviewed with physician Active Inactive Venous Leg Ulcer Nursing Diagnoses: Knowledge deficit related to disease process and management Potential for venous Insuffiency (use before diagnosis confirmed) Goals: Patient will maintain optimal edema control Date Initiated: 07/27/2021 Target Resolution Date: 02/23/2023 Goal Status: Active Interventions: Assess peripheral edema status every visit. Treatment Activities: Therapeutic compression applied : 07/27/2021 Notes: Wound/Skin Impairment Nursing Diagnoses: Impaired tissue integrity Knowledge deficit related to ulceration/compromised skin integrity Goals: Patient will have a decrease in wound volume by X% from date: (specify in notes) Date Initiated: 04/12/2021 Date Inactivated: 01/04/2022 Target Resolution Date: 04/23/2021 Goal Status: Met Patient/caregiver will verbalize understanding of skin care regimen Date Initiated: 01/04/2022 Target Resolution Date: 02/23/2023 Goal Status: Active Ulcer/skin breakdown will have a volume reduction of 30% by week 4 Date Initiated: 04/12/2021 Date Inactivated: 04/27/2021 Target Resolution Date: 04/27/2021 Goal Status: Unmet Unmet Reason: infection Ulcer/skin breakdown will have a volume reduction of 50% by week 8 Date Initiated: 04/27/2021 Date Inactivated: 06/29/2021 Target Resolution  Date: 06/24/2021 Goal Status: Met Interventions: Assess patient/caregiver ability to obtain necessary supplies Assess patient/caregiver ability to perform ulcer/skin care regimen upon admission and as needed Assess ulceration(s) every visit Notes: Electronic Signature(s) Signed: 06/08/2022 6:13:17 PM By: Dellie Catholic  RN Entered By: Dellie Catholic on 06/08/2022 12:40:18 [name] ([id number]) [id number] Page 5 of 10 -------------------------------------------------------------------------------- Pain Assessment Details Patient Name: Date of Service: EATHAN, GROMAN 06/08/2022 11:00 A M Medical Record Number: [id number] Patient Account Number: [id number] Date of Birth/Sex: Treating RN: April 05, 1951 (71 y.o. M) Primary Care Jc Veron: [name] Other Clinician: Referring Medardo Hassing: Treating Jerika Wales/Extender: Stormy Card in Treatment: 60 Active Problems Location of Pain Severity and Description of Pain Patient Has Paino No Site Locations Pain Management and Medication Current Pain Management: Electronic Signature(s) Signed: 06/08/2022 11:57:43 AM By: Worthy Rancher Entered By: Worthy Rancher on 06/08/2022 11:18:12 -------------------------------------------------------------------------------- Patient/Caregiver Education Details Patient Name: Date of Service: Khatib, [name] 12/15/2023andnbsp11:00 A M Medical Record Number: [id number] Patient Account Number: [id number] Date of Birth/Gender: Treating RN: 05/31/1951 (71 y.o. [name] Primary Care Physician: [name] Other Clinician: Referring Physician: Treating Physician/Extender: Stormy Card in Treatment: 60 Education Assessment Education Provided To: Patient Education Topics Provided Wound/Skin Impairment: Methods: Explain/Verbal Responses: Return demonstration correctly EDILBERTO, ROOSEVELT ([id number]) [id number].pdf Page 6 of 10 Electronic Signature(s) Signed: 06/08/2022 6:13:17 PM By: Dellie Catholic RN Entered By: Dellie Catholic on 06/08/2022 12:40:38 -------------------------------------------------------------------------------- Wound Assessment Details Patient Name: Date of Service: SELSO, MANNOR 06/08/2022 11:00 A  M Medical Record Number: [id number] Patient Account Number: [id number] Date of Birth/Sex: Treating RN: [birth year] (71 y.o. M) Primary Care Kyara Boxer: [name] Other Clinician: Referring Josel Keo: Treating Kristofor Michalowski/Extender: Stormy Card in Treatment: 60 Wound Status Wound Number: 22 Primary Cyst Etiology: Wound Location: Left, Proximal, Lateral Lower Leg Wound Open Wounding Event: Bump Status: Date Acquired: 06/03/2021 Comorbid Glaucoma, Sleep Apnea, Hypertension, Peripheral Arterial Disease, Weeks Of Treatment: 52 History: Peripheral Venous Disease, Type II Diabetes, Gout, Osteoarthritis, Clustered Wound: Yes Neuropathy Photos Wound Measurements Length: (cm) Width: (cm) Depth: (cm) Clustered Quantity: Area: (cm) Volume: (cm) 4.8 % Reduction in Area: -197.2% 1.3 % Reduction in Volume: 62.9% 0.1 Epithelialization: Small (1-33%) 3 Tunneling: No 4.901 Undermining: No 0.49 Wound Description Classification: Full Thickness With Exposed Support Structures Wound Margin: Fibrotic scar, thickened scar Exudate Amount: Medium Exudate Type: Serous Exudate Color: amber Foul Odor After Cleansing: No Slough/Fibrino No Wound Bed Granulation Amount: Medium (34-66%) Exposed Structure Granulation Quality: Pink Fascia Exposed: No Necrotic Amount: Medium (34-66%) Fat Layer (Subcutaneous Tissue) Exposed: Yes Necrotic Quality: Eschar, Adherent Slough Tendon Exposed: No Muscle Exposed: No Joint Exposed: No Bone Exposed: No Periwound Skin Texture Texture Color No Abnormalities Noted: No No Abnormalities Noted: No Scarring: Yes Hemosiderin Staining: SIMUEL, STEBNER ([id number]) [id number] Page 7 of 10 Moisture Temperature / Pain No Abnormalities Noted: No Temperature: No Abnormality Dry / Scaly: Yes Tenderness on Palpation: Yes Treatment Notes Wound #22 (Lower Leg) Wound Laterality: Left, Lateral, Proximal Cleanser Soap  and Water Discharge Instruction: May shower and wash wound with dial antibacterial soap and water prior to dressing change. Wound Cleanser Discharge Instruction: Cleanse the wound with wound cleanser prior to applying a clean dressing using gauze sponges, not tissue or cotton balls. Peri-Wound Care Sween Lotion (Moisturizing lotion) Discharge Instruction: Apply moisturizing lotion to the leg Topical Skintegrity Hydrogel 4 (oz) Discharge Instruction: Apply hydrogel as directed Primary Dressing Endoform 2x2 in Discharge Instruction: Moisten with Hydrogel or saline  Secondary Dressing ABD Pad, 8x10 Discharge Instruction: Apply over primary dressing as directed. Secured With Elastic Bandage 4 inch (ACE bandage) Discharge Instruction: Secure with ACE bandage as directed. Compression Wrap Compression Stockings Add-Ons Electronic Signature(s) Signed: 06/08/2022 6:13:17 PM By: Dellie Catholic RN Previous Signature: 06/08/2022 11:57:43 AM Version By: Worthy Rancher Entered By: Dellie Catholic on 06/08/2022 12:01:30 -------------------------------------------------------------------------------- Wound Assessment Details Patient Name: Date of Service: [name] 06/08/2022 11:00 A M Medical Record Number: [id number] Patient Account Number: [id number] Date of Birth/Sex: Treating RN: 04-Feb-1951 (71 y.o. M) Primary Care Kerrion Kemppainen: [name] Other Clinician: Referring Mekia Dipinto: Treating Travaughn [name]/Extender: Stormy Card in Treatment: 60 Wound Status Wound Number: [id number] Primary Diabetic Wound/Ulcer of the Lower Extremity Etiology: Wound Location: Left, Medial Foot Wound Healed - Epithelialized Wounding Event: Blister Status: Date Acquired: 02/01/2022 Comorbid Glaucoma, Sleep Apnea, Hypertension, Peripheral Arterial Disease, Weeks Of Treatment: 18 History: Peripheral Venous Disease, Type II Diabetes, Gout, Osteoarthritis, Clustered Wound: No  Neuropathy Photos JOSEDEJESUS, MARCUM ([id number]) [id number] Page 8 of 10 Wound Measurements Length: (cm) Width: (cm) Depth: (cm) Area: (cm) Volume: (cm) 0 % Reduction in Area: 100% 0 % Reduction in Volume: 100% 0 Epithelialization: Large (67-100%) 0 Tunneling: No 0 Undermining: No Wound Description Classification: Grade 1 Wound Margin: Distinct, outline attached Exudate Amount: None Present Foul Odor After Cleansing: No Slough/Fibrino No Wound Bed Granulation Amount: None Present (0%) Exposed Structure Necrotic Amount: None Present (0%) Fascia Exposed: No Fat Layer (Subcutaneous Tissue) Exposed: No Tendon Exposed: No Muscle Exposed: No Joint Exposed: No Bone Exposed: No Periwound Skin Texture Texture Color No Abnormalities Noted: Yes No Abnormalities Noted: Yes Moisture Temperature / Pain No Abnormalities Noted: Yes Temperature: No Abnormality Electronic Signature(s) Signed: 06/08/2022 6:13:17 PM By: Dellie Catholic RN Previous Signature: 06/08/2022 11:57:43 AM Version By: Worthy Rancher Entered By: Dellie Catholic on 06/08/2022 12:12:42 -------------------------------------------------------------------------------- Wound Assessment Details Patient Name: Date of Service: [name] 06/08/2022 11:00 A M Medical Record Number: [id number] Patient Account Number: [id number] Date of Birth/Sex: Treating RN: 1950/08/05 (71 y.o. [name] Primary Care Zavian Slowey: [name] Other Clinician: Referring Teeghan Hammer: Treating Calahan Pak/Extender: Stormy Card in Treatment: 60 Wound Status Wound Number: 29 Primary Abrasion Etiology: Wound Location: Left, Anterior Lower Leg Wound Open Wounding Event: Gradually Appeared Status: Date Acquired: 06/04/2022 Comorbid Glaucoma, Sleep Apnea, Hypertension, Peripheral Arterial Disease, Weeks Of Treatment: 0 History: Peripheral Venous Disease, Type II Diabetes, Gout,  Osteoarthritis, Clustered Wound: No Neuropathy Photos LUCAN, RINER ([id number]) [id number] Page 9 of 10 Wound Measurements Length: (cm) 2.5 Width: (cm) 1.6 Depth: (cm) 0.1 Area: (cm) 3.142 Volume: (cm) 0.314 % Reduction in Area: % Reduction in Volume: Tunneling: No Undermining: No Wound Description Classification: Full Thickness Without Exposed Suppor Exudate Amount: Medium Exudate Type: Serosanguineous Exudate Color: red, brown t Structures Foul Odor After Cleansing: No Slough/Fibrino No Wound Bed Granulation Amount: Large (67-100%) Exposed Structure Granulation Quality: Red Fascia Exposed: No Fat Layer (Subcutaneous Tissue) Exposed: Yes Tendon Exposed: No Muscle Exposed: No Joint Exposed: No Bone Exposed: No Periwound Skin Texture Texture Color No Abnormalities Noted: No No Abnormalities Noted: No Scarring: Yes Hemosiderin Staining: Yes Moisture Temperature / Pain No Abnormalities Noted: No Temperature: No Abnormality Dry / Scaly: Yes Treatment Notes Wound #29 (Lower Leg) Wound Laterality: Left, Anterior Cleanser Peri-Wound Care Sween Lotion (Moisturizing lotion) Discharge Instruction: Apply moisturizing lotion as directed Topical Primary Dressing Sorbalgon AG Dressing 2x2 (in/in) Discharge Instruction: Apply to wound bed as instructed Secondary Dressing Secured With [place]  Soft Cloth Surgical T 2x10 (in/yd) ape Discharge Instruction: Secure with tape as directed. Compression Wrap ThreePress (3 layer compression wrap) Discharge Instruction: Apply three layer compression as directed. Compression Stockings Add-Ons Electronic Signature(s) Signed: 06/08/2022 6:13:17 PM By: Dellie Catholic RN [name], Arnette Norris ([id number]) [id number] Page 10 of 10 Signed: 06/08/2022 6:13:17 PM By: Dellie Catholic RN Entered By: Dellie Catholic on 06/08/2022  12:00:26 -------------------------------------------------------------------------------- Vitals Details Patient Name: Date of Service: [name] 06/08/2022 11:00 A M Medical Record Number: [id number] Patient Account Number: [id number] Date of Birth/Sex: Treating RN: [birth year] (71 y.o. M) Primary Care Lorilynn Lehr: [name] Other Clinician: Referring Ethan Kasperski: Treating Avia Merkley/Extender: Stormy Card in Treatment: 60 Vital Signs Time Taken: 11:15 Temperature (F): 98.2 Height (in): 74 Pulse (bpm): 71 Weight (lbs): 238 Respiratory Rate (breaths/min): 20 Body Mass Index (BMI): 30.6 Blood Pressure (mmHg): 127/76 Capillary Blood Glucose (mg/dl): 238 Reference Range: 80 - 120 mg / dl Electronic Signature(s) Signed: 06/08/2022 11:57:43 AM By: Worthy Rancher Entered By: Worthy Rancher on 06/08/2022 11:18:04

## 2022-06-09 NOTE — Progress Notes (Signed)
BRONX, BROGDEN ([id number]) [id number] Page 1 of 14 Visit Report for 06/08/2022 HPI Details Patient Name: Date of Service: [name], [name] 06/08/2022 11:00 A M Medical Record Number: [id number] Patient Account Number: [id number] Date of Birth/Sex: Treating RN: 21-Jan-1951 (71 y.o. M) Primary Care Provider: [name] Other Clinician: Referring Provider: Treating Provider/Extender: Stormy Card in Treatment: 23 History of Present Illness HPI Description: 10/11/17; Mr. Woolverton is a 71 year old man who tells me that in 2015 he slipped down the latter traumatizing his left leg. He developed a wound in the same spot the area that we are currently looking at. He states this closed over for the most part although he always felt it was somewhat unstable. In 2016 he hit the same area with the door of his car had this reopened. He tells me that this is never really closed although sometimes an inflow it remains open on a constant basis. He has not been using any specific dressing to this except for topical antibiotics the nature of which were not really sure. His primary doctor did send him to see Dr. [name] of interventional cardiology. He underwent an angiogram on 08/06/17 and he underwent a PTA and directional atherectomy of the lesser distal SFA and popliteal arteries which resulted in brisk improvement in blood flow. It was noted that he had 2 vessel runoff through the anterior tibial and peroneal. He is also been to see vascular and interventional radiologist. He was not felt to have any significant superficial venous insufficiency. Presumably is not a candidate for any ablation. It was suggested he come here for wound care. The patient is a type II diabetic on insulin. He also has a history of venous insufficiency. ABIs on the left were noncompressible in our clinic 10/21/17; patient we admitted to the clinic last week. He has a fairly large chronic  ulcer on the left lateral calf in the setting of chronic venous insufficiency. We put Iodosorb on him after an aggressive debridement and 3 layer compression. He complained of pain in his ankle and itching with is skin in fact he scratched the area on the medial calf superiorly at the rim of our wraps and he has 2 small open areas in that location today which are new. I changed his primary dressing today to silver collagen. As noted he is already had revascularization and does not have any significant superficial venous insufficiency that would be amenable to ablation 10/28/17; patient admitted to the clinic 2 weeks ago. He has a smaller Wound. Scratch injury from last week revealed. There is large wound over the tibial area. This is smaller. Granulation looks healthy. No need for debridement. 11/04/17; the wound on the left lateral calf looks better. Improved dimensions. Surface of this looks better. We've been maintaining him and Kerlix Coban wraps. He finds this much more comfortable. Silver collagen dressing 11/11/17; left lateral Wound continues to look healthy be making progress. Using a #5 curet I removed removed nonviable skin from the surface of the wound and then necrotic debris from the wound surface. Surface of the wound continues to look healthy. He also has an open area on the left great toenail bed. We've been using topical antibiotics. 11/19/17; left anterior lateral wound continues to look healthy but it's not closed. He also had a small wound above this on the left leg Initially traumatic wounds in the setting of significant chronic venous insufficiency and stasis dermatitis 11/25/17; left anterior wounds superiorly is closed still a  small wound inferiorly. 12/02/17; left anterior tibial area. Arrives today with adherent callus. Post debridement clearly not completely closed. Hydrofera Blue under 3 layer compression. 12/09/17; left anterior tibia. Circumferential eschar however the wound  bed looks stable to improved. We've been using Hydrofera Blue under 3 layer compression 12/17/17; left anterior tibia. Apparently this was felt to be closed however when the wrap was taken off there is a skin tear to reopen wounds in the same area we've been using Hydrofera Blue under 3 layer compression 12/23/17 left anterior tibia. Not close to close this week apparently the [facility] was stuck to this again. Still circumferential eschar requiring debridement. I put a contact layer on this this time under the Hydrofera Blue 12/31/17; left anterior tibia. Wound is better slight amount of hyper-granulation. Using Hydrofera Blue over Adaptic. 01/07/18; left anterior tibia. The wound had some surface eschar however after this was removed he has no open wound.he was already revascularized by Dr. [name] when he came to our clinic with atherectomy of the left SFA and popliteal artery. He was also sent to interventional radiology for venous reflux studies. He was not felt to have significant reflux but certainly has chronic venous changes of his skin with hemosiderin deposition around this area. He will definitely need to lubricate his skin and wear compression stocking and I've talked to him about this. READMISSION 05/26/2018 This is a now 71 year old man we cared for with traumatic wounds on his left anterior lower extremity. He had been previously revascularized during that admission by Dr. [name]. Apparently in follow-up Dr. [name] noted that he had deterioration in his arterial status. He underwent a stent placement in the distal left SFA on 04/22/2018. 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 He went back to the angiography suite on 04/30/2018 he underwent PTA and balloon angioplasty of the occluded left mid anterior tibial artery, thrombotic occlusion went from 100 to 0% which reconstitutes the posterior tibial artery. He had thrombectomy and aspiration of the peroneal artery. The stent  placed in the distal SFA left SFA was still occluded. 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 He has been recuperating at [place] home in [facility] after the most recent hospitalization. He is going home tomorrow. He tells me that 2 weeks ago he traumatized the tip of his left fifth toe. He came in urgently for our review of this. This was a history of before I noted that Dr. [name] had already noted dry gangrenous changes of the left fifth toe 06/09/2018; 2-week follow-up. I did contact Dr. [name] after his last appointment and he apparently saw 1 of Dr. Irven Shelling colleagues the next day. He does not follow-up with Dr. [name] himself until Thursday of this week. 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 Nevertheless there is no evidence of infection no drainage and no pain. He had a new area that this week when we were signing him in today on the left anterior mid tibia area, this is in close proximity to the previous wound we have dealt with in this clinic. 06/23/2018; 2-week follow-up. I did not receive a recent note from Dr. [name] to review today. Our office is trying to obtain  this. He is apparently not planning to do further vascular interventions and wondered about compression to try and help with the patient's chronic venous insufficiency. However we are also concerned about the arterial flow. He arrives in clinic today with a new area on the left third toe. The areas on the calf/anterior tibia are close to closing. The left fifth toe is still mummified [name], [name] ([id number]) [id number] Page 2 of 14 using Betadine. -In reviewing things with the patient he has what sounds like  claudication with mild to moderate amount of activity. 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 I prescribed Levaquin over the phone while we attempted to arrange a plan of care. However the patient called yesterday to report he had low-grade fever and he came in today acutely. There is been a marked deterioration in the left third toe with spreading cellulitis up into the dorsal left foot. He was referred to the emergency room. Readmission: 06/29/2020 patient presents today for reevaluation here in our clinic he was previously treated by Dr. Dellia Nims at the latter part of 2019 in 2 the beginning of 2020. Subsequently we have not seen him since that time in the interim he did have evaluation with vein and vascular specialist specifically Dr. Anice Paganini who did perform quite extensive work for a left femoral to anterior tibial artery bypass. With that being said in the interim the patient has developed significant lymphedema and has wounds that he tells me have really never healed in regard to the incision site on the left leg. He also has multiple wounds on the feet for various reasons some of which is that he tends to pick at his feet. Fortunately there is no signs of active infection systemically at this time he does have some wounds that are little bit deeper but most are fairly superficial he seems to have good blood flow and overall everything appears to be healthy I see no bone exposed and no obvious signs of osteomyelitis. I do not know that he necessarily needs a x-ray at this point although that something we could consider depending on how things progress. The patient does have a history of lymphedema, diabetes, this is type II, chronic kidney disease stage III, hypertension, and history of peripheral vascular disease. 07/05/2020; patient admitted last week. Is a patient I remember from 2019 he had a spreading infection involving the left foot and we sent him to  the hospital. 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 He subsequently had a left femoral to anterior tibial bypass by Dr.Cain vein and vascular. He also has severe lymphedema with chronic skin changes related to that on the left leg. The most problematic area that was new today was on the left medial great toe. This was apparently a small area last week there was purulent drainage which our intake nurse cultured. Also areas on the left medial foot and heel left lateral foot. He has 2 areas on the left medial calf left lateral calf in the setting of the severe lymphedema. 07/13/2020 on evaluation today patient appears to be doing better in my opinion compared to his last visit. The good news is there is no signs of active infection systemically and locally I do not see any signs of infection either. He did have an x-ray which was negative that is great news he had a culture which showed MRSA but at the same time he  is been on the doxycycline which has helped. I do think we may [name] to extend this for 7 additional days 1/25; patient admitted to the clinic a few weeks ago. 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 We have been putting him under compression his edema control is a lot better but he is severe verricused skin on the left leg. He is really done quite well he still has an open area on the left medial calf and the left medial first metatarsal head. We have been using silver collagen on the leg silver alginate on the foot 07/27/2020 upon evaluation today patient appears to be doing decently well in regard to his wounds. He still has a lot of dry skin on the left leg. Some of this is starting to peel back and I think he may be able to have them out by removing some that today. Fortunately there is no signs of active infection at this time on the left leg although on the right leg he does appear to have swelling  and erythema as well as some mild warmth to touch. This does have been concerned about the possibility of cellulitis although within the differential diagnosis I do think that potentially a DVT has to be at least considered. We need to rule that out before proceeding would just call in the cellulitis. Especially since he is having pain in the posterior aspect of his calf muscle. 2/8; the patient had seen sparingly. He has severe skin changes of chronic lymphedema in the left leg thickened hyperkeratotic verrucous skin. He has an open wound on the medial part of the left first met head left mid tibia. He also has a rim of nonepithelialized skin in the anterior mid tibia. He brought in the AmLactin lotion that was been prescribed although I am not sure under compression and its utility. There concern about cellulitis on the right lower leg the last time he was here. He was put on on antibiotics. His DVT rule out was negative. The right leg looks fine he is using his stocking on this area 08/10/2020 upon evaluation today patient appears to be doing well with regard to his leg currently. He has been tolerating the dressing changes without complication. Fortunately there is no signs of active infection which is great news. Overall very pleased with where things stand. 2/22; the patient still has an area on the medial part of the left first met his head. This looks better than when I last saw this earlier this month he has a rim of epithelialization but still some surface debris. Mostly everything on the left leg is healed. There is still a vulnerable in the left mid tibia area. 08/30/2020 upon evaluation today patient appears to be doing much better in regard to his wounds on his foot. 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 Nonetheless I think we will address that today  I Minna send in a prescription for him in that regard. Overall though there does not appear to be any signs of significant worsening. 09/07/2020 on evaluation today patient's wounds over his left foot appear to be doing excellent. I do not see any signs of infection there is some callus buildup this can require debridement for certain but overall I feel like he is managing quite nicely. He still using the  AmLactin cream which has been beneficial for him as well. 3/22; left foot wound is closed. There is no open area here. He is using ammonium lactate lotion to the lower extremities to help exfoliate dry cracked skin. He has compression stockings from elastic therapy in [place]. The wound on the medial part of his left first met head is healed today. READMISSION 04/12/2021 Mr. Ficken is a patient we know fairly well he had a prolonged stay in clinic in 2019 with wounds on his left lateral and left anterior lower extremity in the setting of chronic venous insufficiency. More recently he was here earlier this year with predominantly an area on his left foot first metatarsal head plantar and he says the plantar foot broke down on its not long after we discharged him but he did not come back here. The last few months areas of broken down on his left anterior and again the left lateral lower extremity. The leg itself is very swollen chronically enlarged a lot of hyperkeratotic dry Berry Q skin in the left lower leg. His edema extends well into the thigh. He was seen by Dr. Donzetta Matters. He had ABIs on 03/02/2021 showing an ABI on the right of 1 with a TBI of 0.72 his ABI in the left at 1.09 TBI of 0.99. Monophasic and biphasic waveforms on the right. On the left monophasic waveforms were noted he went on to have an angiogram on 03/27/2021 this showed the aortic aortic and iliac segments were free of flow-limiting stenosis the left common femoral vein to evaluate the left femoral to anterior tibial artery bypass  was unobstructed the bypass was patent without any areas of stenosis. We discharged the patient in bilateral juxta lite stockings but very clearly that was not sufficient to control the swelling and maintain skin integrity. He is clearly going to need compression pumps. 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 This is fortunate because he is on his feet for long periods of time. 10/27; patient comes in with our intake nurse reporting copious amount of green drainage from the left anterior mid tibia the left dorsal foot and to a lesser extent the left medial mid tibia. We left the compression wrap on all week for the amount of edema in his left leg is quite a bit better. We use silver alginate as the primary dressing 11/3; edema control is good. Left anterior lower leg left medial lower leg and the plantar first metatarsal head. The left anterior lower leg required debridement. Deep tissue culture I did of this wound showed MRSA I put him on 10 days of doxycycline which she will start today. We have him in compression wraps. He has a security card and [employer] however he is retiring on November 15. We will need to then get him into a better offloading boot for the left foot perhaps a total contact cast 11/10; edema control is quite good. Left anterior and left medial lower leg wounds in the setting of chronic venous insufficiency and lymphedema. He also has a substantial area over the left plantar first metatarsal head. 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 He has significant hypergranulation on the left plantar foot wound. The patient is a diabetic but he does not have significant PAD 11/17; edema control is quite good. Left anterior and left  medial lower leg wounds look better. The really concerning area remains the area on the left plantar [name], [name] ([id number]) [id number] Page 3 of 14 first metatarsal head. He has a rim of epithelialization. 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 Starting today with a forefoot off loader but . possibly a total contact cast. 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 Arrives with a very significant odor from I think all of the wounds on the left leg and the left foot. Because of this I did not put a total contact cast on him today but will could still consider this. His wife was having cataract surgery which is the reason he missed the appointment 12/6. I saw this man 5 days ago with a swelling below the popliteal fossa. I thought he actually might have a Baker's cyst however the DVT rule out study that we could arrange right away was negative the technician told me this was not a ruptured Baker's cyst. We attempted to get this aspirated by under ultrasound guidance in interventional radiology however all they did was an ultrasound however it shows an extensive fluid collection 62 x 8 x 9.4 in the left thigh and left calf. The patient states he thinks this started 8 days ago or so but he really is not complaining of any pain, fever or systemic symptoms. He has not ha 12/20; after some difficulty I managed to get the patient into see Dr. Donzetta Matters. Eventually he was taken into the hospital and had a drain put in the fluid collection below his left knee posteriorly extending into the posterior thigh. He still has the drain in place. Culture of this showed moderate staff aureus few Morganella and few Klebsiella he is now on doxycycline and ciprofloxacin as suggested by infectious disease he is on this for a month. The drain will remain in place until it stops  draining 12/29; he comes in today with the 1 wound on his left leg and the area on the left plantar first met head significantly smaller. Both look healthy. He still has the drain in the left leg. He says he has to change this daily. Follows up with Dr. Donzetta Matters on January 11. 06/29/2021; the wounds that I am following on the left leg and left first met head continued to be quite healthy. However the area where his inferior drain is in place had copious amounts of drainage which was green in color. The wound here is larger. Follows up with Dr. Gwenlyn Saran of vein and vascular his surgeon next week as well as infectious disease. He remains on ciprofloxacin and doxycycline. He is not complaining of excessive pain in either one of the drain areas 1/12; the patient saw vascular surgery and infectious disease. Vascular surgery has left the drain in place as there was still some notable drainage still see him back in 2 weeks. Dr. Velna Ochs stop the doxycycline and ciprofloxacin and I do not believe he follows up with them at this point. Culture I did last week showed both doxycycline resistant MRSA and Pseudomonas not sensitive to ciprofloxacin although only in rare titers 1/19; the patient's wound on the left anterior lower leg is just about healed. We have continued healing of  the area that was medially on the left leg. Left first plantar metatarsal head continues to get smaller. The major problem here is his 2 drain sites 1 on the left upper calf and lateral thigh. There is purulent drainage still from the left lateral thigh. I gave him antibiotics last week but we still have recultured. He has the drain in the area I think this is eventually going to have to come out. I suspect there will be a connecting wound to heal here perhaps with improved VAc 1/26; the patient had his drain removed by vein and vascular on 1/25/. This was a large pocket of fluid in his left thigh that seem to tunnel into his left upper calf. He  had a previous left SFA to anterior tibial artery bypass. His mention his Penrose drain was removed today. He now has a tunneling wound on his left calf and left thigh. Both of these probe widely towards each other although I cannot really prove that they connect. Both wounds on his lower leg anteriorly are closed and his area over the first metatarsal head on his right foot continues to improve. We are using Hydrofera Blue here. He also saw infectious disease culture of the abscess they noted was polymicrobial with MRSA, Morganella and Klebsiella he was treated with doxycycline and ciprofloxacin for 4 weeks ending on 07/03/2021. They did not recommend any further antibiotics. Notable that while he still had the Penrose drain in place last week he had purulent drainage coming out of the inferior IandD site this grew [facility], MRSA and Pseudomonas but there does not appear to be any active infection in this area today with the drain out and he is not systemically unwell 2/2; with regards to the drain sites the superior one on the thigh actually is closed down the one on the upper left lateral calf measures about 8 and half centimeters which is an improvement seems to be less prominent although still with a lot of drainage. The only remaining wound is over the first metatarsal head on the left foot and this looks to be continuing to improve with Hydrofera Blue. 2/9; the area on his plantar left foot continues to contract. Callus around the wound edge. The drain sites specifically have not come down in depth. We put the wound VAC on Monday he changed the canister late last night our intake nurse reported a pocket of fluid perhaps caused by our compression wraps 2/16; continued improvement in left foot plantar wound. drainage site in the calf is not improved in terms of depth (wound vac) 2/23; continued improvement in the left foot wound over the first metatarsal head. With regards to the drain sites  the area on his thigh laterally is healed however the open area on his calf is small in terms of circumference by still probes in by about 15 cm. Within using the wound VAC. Hydrofera Blue on his foot 08/24/2021: The left first metatarsal head wound continues to improve. The wound bed is healthy with just some surrounding callus. Unfortunately the open drain site on his calf remains open and tunnels at least 15 cm (the extent of a Q-tip). This is despite several weeks of wound VAC treatment. Based on reading back through the notes, there has been really no significant change in the depth of the wound, although the orifice is smaller and the more cranial wound on his thigh has closed. I suspect the tunnel tracks nearly all the way to this location. 08/31/2021:  Continued improvement in the left first metatarsal head wound. There has been absolutely no improvement to the long tunnel from his open drain site on his calf. We have tried to get him into see vascular surgery sooner to consider the possibility of simply filleting the tract open and allowing it to heal from the bottom up, likely with a wound VAC. They have not yet scheduled a sooner appointment than his current mid April 09/14/2021: He was seen by vascular surgery and they took him to the operating room last week. They opened a portion of the tunnel, but did not extend the entire length of the known open subcutaneous tract. I read Dr. Claretha Cooper operative note and it is not clear from that documentation why only a portion of the tract was opened. The heaped up granulation tissue was curetted and removed from at least some portion of the tract. They did place a wound VAC and applied an Unna boot to the leg. The ulcer on his left first metatarsal head is smaller today. The bed looks good and there is just a small amount of surrounding callus. 09/21/2021: The ulcer on his left first metatarsal head looks to be stalled. There is some callus surrounding the  wound but the wound bed itself does not appear particularly dynamic. The tunnel tract on his lateral left leg seems to be roughly the same length or perhaps slightly smaller but the wound bed appears healthy with good granulation tissue. He opened up a new wound on his medial thigh and the site of a prior surgical incision. He says that he did this unconsciously in his sleep by scratching. 09/28/2021: Unfortunately, the ulcer on his left first metatarsal head has extended underneath the callus toward the dorsum of his foot. The medial thigh wounds are roughly the same. The tunnel on his lateral left leg continues to be problematic; it is longer than we are able to actually probe with a Q-tip. I am still not certain as to why Dr. Donzetta Matters did not open this up entirely when he took the patient to the operating room. We will likely be back in the same situation with just a small superficial opening in a long unhealed tract, as the open portion is granulating in nicely. 10/02/2021: The patient was initially scheduled for a nurse visit, but we are also applying a total contact cast today. The plantar foot wound looks clean without significant accumulated callus. We have been applying Prisma silver collagen to the site. 10/05/2021: The patient is here for his first total contact cast change. We have tried using gauze packing strips in the tunnel on his lateral leg wound, but this does not seem to be working any better than the white VAC foam. The foot ulcer looks about the same with minimal periwound callus. Medial thigh wound is clean with just some overlying eschar. 10/12/2021: The plantar foot wound is stable without any significant accumulation of periwound callus. The surface is viable with good granulation tissue. The ALESSIO, BOGAN ([id number]) [id number] Page 4 of 14 medial thigh wounds are much smaller and are epithelializing. On the other hand, he had purulent drainage coming  from the tunnel on his lateral leg. He does go back to see Dr. Donzetta Matters next week and is planning to ask him why the wound tunnel was not completely opened at the time of his most recent operation. 10/19/2021: The plantar foot wound is markedly improved and has epithelial tissue coming through the surface. The medial thigh wounds  are nearly closed with just a tiny open area. He did see Dr. Donzetta Matters earlier this week and apparently they did discuss the possibility of opening the sinus tract further and enabling a wound VAC application. Apparently there are some limits as to what Dr. 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 I think if we could get the tract open to the level of the popliteal fossa, this would greatly aid in her ability to get this chart closed. That being said, however, today when I probed the tract with a Q-tip, I was not able to insert the entirety of the Q-tip as I have on previous occasions. The tunnel is shorter by about 4 cm. The surface is clean with good granulation tissue and no further episodes of purulent drainage. 10/30/2021: Last week, the patient underwent surgery and had the long tract in his leg opened. There was a rind that was debrided, according to the operative report. His medial thigh ulcers are closed. The plantar foot wound is clean with a good surface and some built up surrounding callus. 11/06/2021: The overall dimensions of the large wound on his lateral leg remain about the same, but there is good granulation tissue present and the tunneling is a little bit shorter. He has a new wound on his anterior tibial surface, in the same location where he had a similar lesion in the past. The plantar foot wound is clean with some buildup surrounding callus. Just toward the medial aspect of his foot, however, there is an area of darkening that once debrided, revealed another opening in the skin surface. 11/13/2021: The anterior tibial surface  wound is closed. The plantar foot wound has some surrounding callus buildup. The area of darkening that I debrided last week and revealed an opening in the skin surface has closed again. The tunnel in the large wound on his lateral leg has come in by about 3 cm. There is healthy granulation tissue on the entire wound surface. 11/23/2021: The patient was out of town last week and did wet-to-dry dressings on his large wound. 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 Unfortunately, he picked open the wound on his left medial thigh. He says that it was itching and he just could not stop scratching it until it was open again. The wound on his plantar foot is smaller and has not accumulated a tremendous amount of callus. The lateral leg wound is shallower and the tunnel has also decreased in depth. There is just a little bit of slough accumulation on the surface. 11/30/2021: Another portion of his left medial thigh has been opened up. All of these wounds are fairly superficial with just a little bit of slough and eschar accumulation. The wound on his plantar foot is almost closed with just a bit of eschar and periwound callus accumulation. The lateral leg wound is nearly flush with the surrounding skin and the tunnel is markedly shallower. 12/07/2021: There is just 1 open area on his left medial thigh. It is clean with just a little bit of perimeter eschar. The wound on his plantar foot continues to contract and just has some eschar and periwound callus accumulation. The lateral leg wound is closing at the more distal aspect and the tunnel is smaller. The surface is nearly flush with the surrounding skin and it has a good bed of granulation tissue. 12/14/2021: The thigh and foot wounds are closed. The  lateral leg wound has closed over approximately half of its length. The tunnel continues to contract and the surface is now flush with the  surrounding skin. The wound bed has robust granulation tissue. 12/22/2021: The thigh and foot wounds have reopened. The foot wound has a lot of callus accumulation around and over it. The thigh wound is tiny with just a little bit of slough in the wound bed. The lateral leg wound continues to contract. His vascular surgeon took the wound VAC off earlier in the week and the patient has been doing wet-to-dry dressings. There is a little slough accumulation on the surface. The tunnel is about 3 cm in depth at this point. 12/28/2021: The thigh wound is closed again. The foot wound has some callus that subsequently has peeled back exposing just a small slit of a wound. The lateral leg wound Is down to about half the size that it originally was and the tunnel is down to about half a centimeter in depth. 01/04/2022: The thigh wound remains closed. The foot wound has heavy callus overlying the wound site. Once this was debrided, the wound was found to be closed. The lateral leg wound is smaller again this week and very superficial. No tunnel could be identified. 01/12/2022: The thigh and foot wounds both remain closed. The lateral leg wound is now nearly flush with the skin surface. There is good granulation tissue present with a light layer of slough. 01/19/2022: Due to the way his wrap was placed, the patient did not change the dressing on his thigh at all and so the foam was saturated and his skin is macerated. There is a light layer of slough on the wound surface. The underlying granulation tissue is robust and healthy-appearing. He has heavy callus buildup at the site of his first metatarsal head wound which is still healed. 02/01/2022: He has been in silver alginate. When he removed the dressing from his thigh wound, however, some leg, superficially reopening a portion of the wound that had healed. In addition, underneath the callus at his left first metatarsal head, there appears to be a blister and the wound  appears to be open again. 02/08/2022: The lateral leg wound has contracted substantially. There is eschar and a light layer of slough present. He says that it is starting to pull and is uncomfortable. On inspection, there is some puckering of the scar and the eschar is quite dry; this may account for his symptoms. On his first metatarsal head, the wound is much smaller with just some eschar on the surface. The callus has not reaccumulated. He reports that he had a blister come up on his medial thigh wound at the distal aspect. It popped and there is now an opening in his skin again. Looking back through his [place] of wound photos, there is what looks like a permanent suture just deep to this location and it may be trying to erode through. We have been using silver alginate on his wounds. 02/15/2022: The lateral leg wound is about half the size it was last week. It is clean with just a little perimeter eschar and light slough. The wound on his first metatarsal head is about the same with heavy callus overlying it. The medial thigh wound is closed again. He does have some skin changes on the top of his foot that looks potentially yeast related. 02/22/2022: The skin on the top of his foot improved with the use of a topical antifungal. The lateral leg wound continues  to contract and is again smaller this week. There is a little bit of slough and eschar on the surface. The first metatarsal head wound is a little bit smaller but has reaccumulated a thick callus over the top. He decided to try to trim his toenail and ultimately took the entire nail off of his left great toe. 03/02/2022: His lateral leg wound continues to improve, as does the wound on his left great toe. Unfortunately, it appears that somehow his foot got wet and moisture seeped in through the opening causing his skin to lift. There is a large wound now overlying his first metatarsal on both the plantar, medial, and dorsal portion of his foot.  There is necrotic tissue and slough present underneath the shaggy macerated skin. 03/08/2022: The lateral leg wound is smaller again today. There is just a light layer of slough and eschar on the surface. The great toe wound is smaller again today. The first metatarsal wound is a little bit smaller today and does not look nearly as necrotic and macerated. There is still slough and nonviable tissue present. 03/15/2022: The lateral leg wound is narrower and just has a little bit of light slough buildup. The first metatarsal wound still has a fair amount of moisture affecting the periwound skin. The great toe wound is healed. 03/22/2022: The lateral leg wound is now isolated to just at the level of his knee. There is some eschar and slough accumulation. The first metatarsal head wound has epithelialized tremendously and is about half the size that it was last week. He still has some maceration on the top of his foot and a fungal odor is present. 03/29/2022: T oday the patient's foot was macerated, suggesting that the cast got wet. The patient has also been picking at his dry skin and has enlarged the wound on his left lateral leg. In the time between having his cast removed and my evaluation, he had picked more dry skin and opened up additional wounds on his Achilles area and dorsal foot. The plantar first metatarsal head wound, however, is smaller and clean with just macerated callus around the perimeter and light slough on the surface. The lateral leg wound measured a little bit larger but is also fairly clean with eschar and minimal slough. [name], [name] ([id number]) [id number] Page 5 of 14 04/02/2022: The patient had vascular studies done last Friday and so his cast was not applied. He is here today to have that done. 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 04/05/2022: Both wounds are smaller and quite clean. There is just a little biofilm on the lateral leg  wound. 10/20; the patient has a wound on the left lateral surgical incision at the level of his lateral knee this looks clean and improved. He is using silver alginate. He also has an area on his left medial foot for which she is using Hydrofera Blue under a total contact cast both wounds are measuring smaller 04/20/2022: The plantar foot wound has contracted considerably and is very close to closing. The lateral leg wound was measured a little larger, but there was a tiny open area that was included in the measurements that was not included last week. He has some eschar around the perimeter but otherwise the wound looks clean. 04/27/2022: The lateral leg wound looks better this week. He says that midweek, he felt it was very dry and began applying hydrogel to the site. I think this was beneficial. The foot wound is nearly  closed underneath a thick layer of dry skin and callus. 05/04/2022: The foot wound is healed. He has developed a new small ulcer on his anterior tibial surface about midway up his leg. It has a little slough on the surface. The lateral leg wound still is fairly dry, but clean with just a little biofilm on the surface. 05/11/2022: The wound on his foot reopened on Wednesday. A large blister formed which then broke open revealing the fat layer underneath. The ulcer on his anterior tibial surface is a little bit larger this week. The lateral leg wound has much better moisture balance this week. Fortunately, prior to his foot wound reopening, he did get the cast made for his orthotic. 05/15/2022: Already, the left medial foot wound has improved. The tissue is less macerated and the surface is clean. The ulcer on his anterior tibial surface continues to enlarge. This seems likely secondary to accumulated moisture. The lateral leg wound continues to have an improved moisture balance with the use of collagen. 05/25/2022: The medial foot wound continues to contract. It is now substantially  smaller with just a little slough on the surface. The anterior tibial surface wound continues to enlarge further. Once again, this seems to be secondary to moisture. The lateral leg wound does not seem to be changing much in size, but the moisture balance is better. 06/01/2022: The anterior tibial wound is closed. The medial foot wound is down to just a very small, couple of millimeters, opening. The lateral leg wound has good moisture balance, but remains unchanged in size. 12/15; the patient's anterior tibial wound has reopened, however the area on his right first metatarsal head is closed. The major wound is actually on the superior part of his surgical wound in the left lateral thigh. Not a completely viable surface under illumination. This may at some point require a debridement I think he is currently using Prisma. As noted the left medial foot wound has closed Electronic Signature(s) Signed: 06/08/2022 4:40:59 PM By: Linton Ham MD Entered By: Linton Ham on 06/08/2022 13:02:58 -------------------------------------------------------------------------------- Physical Exam Details Patient Name: Date of Service: [name] 06/08/2022 11:00 A M Medical Record Number: [id number] Patient Account Number: [id number] Date of Birth/Sex: Treating RN: August 06, 1950 (71 y.o. M) Primary Care Provider: [name] Other Clinician: Referring Provider: Treating Provider/Extender: Stormy Card in Treatment: 60 Constitutional Sitting or standing Blood Pressure is within target range for patient.. Supine Blood Pressure is within target range for patient.. Pulse regular and within target range for patient.Marland Kitchen Appears in no distress. Notes Wound exam; the anterior tibial wound is reopened. I wonder if this is traumatic or secondary to his known chronic venous insufficiency. The area on the left first metatarsal head is closed there is nothing here. And then finally he has a  extensive wound in the superior one third of his surgical wound which was an abscess Electronic Signature(s) Signed: 06/08/2022 4:40:59 PM By: Linton Ham MD Entered By: Linton Ham on 06/08/2022 13:04:08 [name] ([id number]) [id number] Page 6 of 14 -------------------------------------------------------------------------------- Physician Orders Details Patient Name: Date of Service: [name], [name] 06/08/2022 11:00 A M Medical Record Number: [id number] Patient Account Number: [id number] Date of Birth/Sex: Treating RN: Feb 07, 1951 (71 y.o. [name] Primary Care Provider: [name] Other Clinician: Referring Provider: Treating Provider/Extender: Stormy Card in Treatment: 14 Verbal / Phone Orders: No Diagnosis Coding Follow-up Appointments ppointment in 1 week. - Dr Celine Ahr - Room 1 Return A  Other: - [facility] next week 06/13/22 Anesthetic Wound #22 Left,Proximal,Lateral Lower Leg (In clinic) Topical Lidocaine 4% applied to wound bed Bathing/ Shower/ Hygiene May shower with protection but do not get wound dressing(s) wet. - Use a cast protector so you can shower without getting your cast wet Edema Control - Lymphedema / SCD / Other Elevate legs to the level of the heart or above for 30 minutes daily and/or when sitting, a frequency of: - throughout the day Avoid standing for long periods of time. Patient to wear own compression stockings every day. - on right leg; Moisturize legs daily. Compression stocking or Garment 20-30 mm/Hg pressure to: - left leg daily Off-Loading Total Contact Cast to Left Lower Extremity Other: - minimal weight bearing left foot Non Wound Condition Other Extremity - LEFT MEDIAL FOOT Other Non Wound Condition Orders/Instructions: - Place Optifoam on this Left medial foot before 3 layered compression Wound Treatment Wound #22 - Lower Leg Wound Laterality: Left,  Lateral, Proximal Cleanser: Soap and Water 3 x Per Week/30 Days Discharge Instructions: May shower and wash wound with dial antibacterial soap and water prior to dressing change. Cleanser: Wound Cleanser 3 x Per Week/30 Days Discharge Instructions: Cleanse the wound with wound cleanser prior to applying a clean dressing using gauze sponges, not tissue or cotton balls. Peri-Wound Care: Sween Lotion (Moisturizing lotion) 3 x Per Week/30 Days Discharge Instructions: Apply moisturizing lotion to the leg Topical: Skintegrity Hydrogel 4 (oz) 3 x Per Week/30 Days Discharge Instructions: Apply hydrogel as directed Prim Dressing: Endoform 2x2 in 3 x Per Week/30 Days ary Discharge Instructions: Moisten with Hydrogel or saline Secondary Dressing: ABD Pad, 8x10 (Generic) 3 x Per Week/30 Days Discharge Instructions: Apply over primary dressing as directed. Secured With: Elastic Bandage 4 inch (ACE bandage) 3 x Per Week/30 Days Discharge Instructions: Secure with ACE bandage as directed. Wound #29 - Lower Leg Wound Laterality: Left, Anterior Peri-Wound Care: Sween Lotion (Moisturizing lotion) 1 x Per Week/30 Days Discharge Instructions: Apply moisturizing lotion as directed Prim Dressing: Sorbalgon AG Dressing 2x2 (in/in) 1 x Per Week/30 Days ary Discharge Instructions: Apply to wound bed as instructed Secured With: 91M Medipore Soft Cloth Surgical T 2x10 (in/yd) 1 x Per Week/30 Days ape Discharge Instructions: Secure with tape as directed. Compression Wrap: ThreePress (3 layer compression wrap) 1 x Per Week/30 Days Discharge Instructions: Apply three layer compression as directed. AARAN, ENBERG ([id number]) [id number] Page 7 of 14 Electronic Signature(s) Signed: 06/08/2022 4:40:59 PM By: Linton Ham MD Signed: 06/08/2022 6:13:17 PM By: Dellie Catholic RN Entered By: Dellie Catholic on 06/08/2022  12:39:48 -------------------------------------------------------------------------------- Problem List Details Patient Name: Date of Service: [name] 06/08/2022 11:00 A M Medical Record Number: [id number] Patient Account Number: [id number] Date of Birth/Sex: Treating RN: February 18, 1951 (71 y.o. M) Primary Care Provider: [name] Other Clinician: Referring Provider: Treating Provider/Extender: Stormy Card in Treatment: 60 Active Problems ICD-10 Encounter Code Description Active Date MDM Diagnosis E11.621 Type 2 diabetes mellitus with foot ulcer 04/12/2021 No Yes 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 04/12/2021 No Yes L97.528 Non-pressure chronic ulcer of other part of left foot with other specified 04/12/2021 No Yes severity L97.828 Non-pressure chronic ulcer of other part of left lower leg with other specified 04/12/2021 No Yes severity L97.828 Non-pressure chronic ulcer of other part of left lower leg with other specified 06/08/2022 No Yes severity I89.0 Lymphedema, not elsewhere classified 04/12/2021 No Yes I87.322 Chronic venous hypertension (idiopathic) with  inflammation of left lower 04/12/2021 No Yes extremity Inactive Problems ICD-10 Code Description Active Date Inactive Date E11.42 Type 2 diabetes mellitus with diabetic polyneuropathy 04/12/2021 04/12/2021 L02.416 Cutaneous abscess of left lower limb 06/13/2021 06/13/2021 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  06/08/2022 11:00 A M Medical Record Number: [id number] Patient Account Number: [id number] Date of Birth/Sex: Treating RN: [birth year] (71 y.o. M) Primary Care Provider: [name] Other Clinician: Referring Provider: Treating Provider/Extender: Stormy Card in Treatment: 60 Subjective History of Present Illness (HPI) 10/11/17; Mr. Arca is a 71 year old man who tells me that in 2015 he slipped down the latter traumatizing his left leg. He developed a wound in the same spot the area that we are currently looking at. He states this closed over for the most part although he always felt it was somewhat unstable. In 2016 he hit the same area with the door of his car had this reopened. He tells me that this is never really closed although sometimes an inflow it remains open on a constant basis. He has not been using any specific dressing to this except for topical antibiotics the nature of which were not really sure. His primary doctor did send him to see Dr. [name] of interventional cardiology. He underwent an angiogram on 08/06/17 and he underwent a PTA and directional atherectomy of the lesser distal SFA and popliteal arteries which resulted in brisk improvement in blood flow. It was noted that he had 2 vessel runoff through the anterior tibial and peroneal. He is also been to see vascular and interventional radiologist. He was not felt to have any significant superficial venous insufficiency. Presumably is not a candidate for any ablation. It was suggested he come here for wound care. The patient is a type II diabetic on insulin. He also has a history of venous insufficiency. ABIs on the left were noncompressible in our clinic 10/21/17; patient we admitted to the clinic last week. 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 We put Iodosorb on him after an aggressive debridement and 3 layer compression. He complained  of pain in his ankle and itching with is skin in fact he scratched the area on the medial calf superiorly at the rim of our wraps and he has 2 small open areas in that location today which are new. I changed his primary dressing today to silver collagen. As noted he is already had revascularization and does not have any significant superficial venous insufficiency that would be amenable to ablation 10/28/17; patient admitted to the clinic 2 weeks ago. He has a smaller Wound. Scratch injury from last week revealed. There is large wound over the tibial area. This is smaller. Granulation looks healthy. No need for debridement. 11/04/17; the wound on the left lateral calf looks better. Improved dimensions. Surface of this looks better. We've been maintaining him and Kerlix Coban wraps. He finds this much more comfortable. Silver collagen dressing 11/11/17; left lateral Wound continues to look healthy be making progress. Using a #5 curet I removed removed nonviable skin from the surface of the wound and then necrotic debris from the wound surface. Surface of the wound continues to  look healthy. ooHe also has an open area on the left great toenail bed. We've been using topical antibiotics. 11/19/17; left anterior lateral wound continues to look healthy but it's not closed. ooHe also had a small wound above this on the left leg ooInitially traumatic wounds in the setting of significant chronic venous insufficiency and stasis dermatitis 11/25/17; left anterior wounds superiorly is closed still a small wound inferiorly. 12/02/17; left anterior tibial area. Arrives today with adherent callus. Post debridement clearly not completely closed. Hydrofera Blue under 3 layer compression. 12/09/17; left anterior tibia. Circumferential eschar however the wound bed looks stable to improved. We've been using Hydrofera Blue under 3 layer compression 12/17/17; left anterior tibia. Apparently this was felt to be closed however  when the wrap was taken off there is a skin tear to reopen wounds in the same area we've been using Hydrofera Blue under 3 layer compression 12/23/17 left anterior tibia. Not close to close this week apparently the [facility] was stuck to this again. Still circumferential eschar requiring debridement. I put a contact layer on this this time under the Hydrofera Blue 12/31/17; left anterior tibia. Wound is better slight amount of hyper-granulation. Using Hydrofera Blue over Adaptic. 01/07/18; left anterior tibia. The wound had some surface eschar however after this was removed he has no open wound.he was already revascularized by Dr. [name] when he came to our clinic with atherectomy of the left SFA and popliteal artery. He was also sent to interventional radiology for venous reflux studies. He was not felt to have significant reflux but certainly has chronic venous changes of his skin with hemosiderin deposition around this area. He will definitely need to lubricate his skin and wear compression stocking and I've talked to him about this. READMISSION 05/26/2018 This is a now 71 year old man we cared for with traumatic wounds on his left anterior lower extremity. He had been previously revascularized during that admission by Dr. [name]. Apparently in follow-up Dr. [name] noted that he had deterioration in his arterial status. He underwent a stent placement in the distal left SFA on 04/22/2018. 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 He went back to the angiography suite on 04/30/2018 he underwent PTA and balloon angioplasty of the occluded left mid anterior tibial artery, thrombotic occlusion went from 100 to 0% which reconstitutes the posterior tibial artery. He had thrombectomy and aspiration of the peroneal artery. The stent placed in the distal SFA left SFA was still occluded. He was discharged on Xarelto, it was noted on the discharge summary from this hospitalization that he had  gangrene at the tip of his left fifth toe and there were expectations this would auto amputate. 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 He has been recuperating at [facility] in [address] after the most recent hospitalization. He is going home tomorrow. He tells me that 2 weeks ago he traumatized the tip of his left fifth toe. He came in urgently for our review of this. This was a history of before I noted that Dr. [name] had already noted dry gangrenous changes of the left fifth toe 06/09/2018; 2-week follow-up. I did contact Dr. [name] after his last appointment and he apparently saw 1 of Dr. Irven Shelling colleagues the next day. He does not follow-up with Dr. [name] himself until Thursday of this week. 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 Nevertheless there is no evidence  of infection no drainage and no pain. He had a new area that this week when we were signing him in today on the left anterior mid tibia area, this is in close [name], [name] ([id number]) [id number] Page 9 of 14 proximity to the previous wound we have dealt with in this clinic. 06/23/2018; 2-week follow-up. I did not receive a recent note from Dr. [name] to review today. Our office is trying to obtain this. He is apparently not planning to do further vascular interventions and wondered about compression to try and help with the patient's chronic venous insufficiency. However we are also concerned about the arterial flow. ooHe arrives in clinic today with a new area on the left third toe. The areas on the calf/anterior tibia are close to closing. The left fifth toe is still mummified using Betadine. -In reviewing things with the patient he has what sounds like claudication with mild to moderate amount of activity. 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 I prescribed Levaquin over the phone  while we attempted to arrange a plan of care. However the patient called yesterday to report he had low-grade fever and he came in today acutely. There is been a marked deterioration in the left third toe with spreading cellulitis up into the dorsal left foot. He was referred to the emergency room. Readmission: 06/29/2020 patient presents today for reevaluation here in our clinic he was previously treated by Dr. Dellia Nims at the latter part of 2019 in 2 the beginning of 2020. Subsequently we have not seen him since that time in the interim he did have evaluation with vein and vascular specialist specifically Dr. Anice Paganini who did perform quite extensive work for a left femoral to anterior tibial artery bypass. With that being said in the interim the patient has developed significant lymphedema and has wounds that he tells me have really never healed in regard to the incision site on the left leg. He also has multiple wounds on the feet for various reasons some of which is that he tends to pick at his feet. Fortunately there is no signs of active infection systemically at this time he does have some wounds that are little bit deeper but most are fairly superficial he seems to have good blood flow and overall everything appears to be healthy I see no bone exposed and no obvious signs of osteomyelitis. I do not know that he necessarily needs a x-ray at this point although that something we could consider depending on how things progress. The patient does have a history of lymphedema, diabetes, this is type II, chronic kidney disease stage III, hypertension, and history of peripheral vascular disease. 07/05/2020; patient admitted last week. Is a patient I remember from 2019 he had a spreading infection involving the left foot and we sent him to the hospital. 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 He subsequently had a left femoral to anterior tibial bypass by Dr.Cain  vein and vascular. He also has severe lymphedema with chronic skin changes related to that on the left leg. The most problematic area that was new today was on the left medial great toe. This was apparently a small area last week there was purulent drainage which our intake nurse cultured. Also areas on the left medial foot and heel left lateral foot. He has 2 areas on the left medial calf left lateral calf in the setting of the severe  lymphedema. 07/13/2020 on evaluation today patient appears to be doing better in my opinion compared to his last visit. The good news is there is no signs of active infection systemically and locally I do not see any signs of infection either. He did have an x-ray which was negative that is great news he had a culture which showed MRSA but at the same time he is been on the doxycycline which has helped. I do think we may [name] to extend this for 7 additional days 1/25; patient admitted to the clinic a few weeks ago. 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 We have been putting him under compression his edema control is a lot better but he is severe verricused skin on the left leg. He is really done quite well he still has an open area on the left medial calf and the left medial first metatarsal head. We have been using silver collagen on the leg silver alginate on the foot 07/27/2020 upon evaluation today patient appears to be doing decently well in regard to his wounds. He still has a lot of dry skin on the left leg. Some of this is starting to peel back and I think he may be able to have them out by removing some that today. Fortunately there is no signs of active infection at this time on the left leg although on the right leg he does appear to have swelling and erythema as well as some mild warmth to touch. This does have been concerned about the possibility of cellulitis although within the differential diagnosis I do think  that potentially a DVT has to be at least considered. We need to rule that out before proceeding would just call in the cellulitis. Especially since he is having pain in the posterior aspect of his calf muscle. 2/8; the patient had seen sparingly. He has severe skin changes of chronic lymphedema in the left leg thickened hyperkeratotic verrucous skin. He has an open wound on the medial part of the left first met head left mid tibia. He also has a rim of nonepithelialized skin in the anterior mid tibia. He brought in the AmLactin lotion that was been prescribed although I am not sure under compression and its utility. There concern about cellulitis on the right lower leg the last time he was here. He was put on on antibiotics. His DVT rule out was negative. The right leg looks fine he is using his stocking on this area 08/10/2020 upon evaluation today patient appears to be doing well with regard to his leg currently. He has been tolerating the dressing changes without complication. Fortunately there is no signs of active infection which is great news. Overall very pleased with where things stand. 2/22; the patient still has an area on the medial part of the left first met his head. This looks better than when I last saw this earlier this month he has a rim of epithelialization but still some surface debris. Mostly everything on the left leg is healed. There is still a vulnerable in the left mid tibia area. 08/30/2020 upon evaluation today patient appears to be doing much better in regard to his wounds on his foot. 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 Nonetheless I think we will address that today I Minna send in a prescription  for him in that regard. Overall though there does not appear to be any signs of significant worsening. 09/07/2020 on evaluation today  patient's wounds over his left foot appear to be doing excellent. I do not see any signs of infection there is some callus buildup this can require debridement for certain but overall I feel like he is managing quite nicely. He still using the AmLactin cream which has been beneficial for him as well. 3/22; left foot wound is closed. There is no open area here. He is using ammonium lactate lotion to the lower extremities to help exfoliate dry cracked skin. He has compression stockings from elastic therapy in [place]. The wound on the medial part of his left first met head is healed today. READMISSION 04/12/2021 Mr. Cannell is a patient we know fairly well he had a prolonged stay in clinic in 2019 with wounds on his left lateral and left anterior lower extremity in the setting of chronic venous insufficiency. More recently he was here earlier this year with predominantly an area on his left foot first metatarsal head plantar and he says the plantar foot broke down on its not long after we discharged him but he did not come back here. The last few months areas of broken down on his left anterior and again the left lateral lower extremity. The leg itself is very swollen chronically enlarged a lot of hyperkeratotic dry Berry Q skin in the left lower leg. His edema extends well into the thigh. He was seen by Dr. Donzetta Matters. He had ABIs on 03/02/2021 showing an ABI on the right of 1 with a TBI of 0.72 his ABI in the left at 1.09 TBI of 0.99. Monophasic and biphasic waveforms on the right. On the left monophasic waveforms were noted he went on to have an angiogram on 03/27/2021 this showed the aortic aortic and iliac segments were free of flow-limiting stenosis the left common femoral vein to evaluate the left femoral to anterior tibial artery bypass was unobstructed the bypass was patent without any areas of stenosis. We discharged the patient in bilateral juxta lite stockings but very clearly that was not sufficient  to control the swelling and maintain skin integrity. He is clearly going to need compression pumps. 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 This is fortunate because he is on his feet for long periods of time. 10/27; patient comes in with our intake nurse reporting copious amount of green drainage from the left anterior mid tibia the left dorsal foot and to a lesser extent the left medial mid tibia. We left the compression wrap on all week for the amount of edema in his left leg is quite a bit better. We use silver alginate as the primary dressing 11/3; edema control is good. Left anterior lower leg left medial lower leg and the plantar first metatarsal head. The left anterior lower leg required debridement. Deep tissue culture I did of this wound showed MRSA I put him on 10 days of doxycycline which she will start today. We have him in compression wraps. He has a security card and [employer] however he is retiring on November 15. We will need to then get him into a better offloading boot for the left foot perhaps a total contact cast 11/10; edema control is quite good. Left anterior and left medial lower leg wounds in the setting of chronic venous insufficiency and lymphedema. He  also has [name], [name] ([id number]) [id number] Page 10 of 14 a substantial area over the left plantar first metatarsal head. 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 He has significant hypergranulation on the left plantar foot wound. The patient is a diabetic but he does not have significant PAD 11/17; edema control is quite good. Left anterior and left medial lower leg wounds look better. The really concerning area remains the area on the left plantar first metatarsal head. He has a rim of epithelialization. He has been using a surgical shoe The  patient is now retired from a a [employer] I have gone over with him the need to offload this area aggressively. Starting today with a forefoot off loader but . possibly a total contact cast. 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 Arrives with a very significant odor from I think all of the wounds on the left leg and the left foot. Because of this I did not put a total contact cast on him today but will could still consider this. His wife was having cataract surgery which is the reason he missed the appointment 12/6. I saw this man 5 days ago with a swelling below the popliteal fossa. I thought he actually might have a Baker's cyst however the DVT rule out study that we could arrange right away was negative the technician told me this was not a ruptured Baker's cyst. We attempted to get this aspirated by under ultrasound guidance in interventional radiology however all they did was an ultrasound however it shows an extensive fluid collection 62 x 8 x 9.4 in the left thigh and left calf. The patient states he thinks this started 8 days ago or so but he really is not complaining of any pain, fever or systemic symptoms. He has not ha 12/20; after some difficulty I managed to get the patient into see Dr. Donzetta Matters. Eventually he was taken into the hospital and had a drain put in the fluid collection below his left knee posteriorly extending into the posterior thigh. He still has the drain in place. Culture of this showed moderate staff aureus few Morganella and few Klebsiella he is now on doxycycline and ciprofloxacin as suggested by infectious disease he is on this for a month. The drain will remain in place until it stops draining 12/29; he comes in today with the 1 wound on his left leg and the area on the left plantar first met head significantly smaller. Both look healthy. He still has  the drain in the left leg. He says he has to change this daily. Follows up with Dr. Donzetta Matters on January 11. 06/29/2021; the wounds that I am following on the left leg and left first met head continued to be quite healthy. However the area where his inferior drain is in place had copious amounts of drainage which was green in color. The wound here is larger. Follows up with Dr. Gwenlyn Saran of vein and vascular his surgeon next week as well as infectious disease. He remains on ciprofloxacin and doxycycline. He is not complaining of excessive pain in either one of the drain areas 1/12; the patient saw vascular surgery and infectious disease. Vascular surgery has left the drain in place as there was still some notable drainage  still see him back in 2 weeks. Dr. Velna Ochs stop the doxycycline and ciprofloxacin and I do not believe he follows up with them at this point. Culture I did last week showed both doxycycline resistant MRSA and Pseudomonas not sensitive to ciprofloxacin although only in rare titers 1/19; the patient's wound on the left anterior lower leg is just about healed. We have continued healing of the area that was medially on the left leg. Left first plantar metatarsal head continues to get smaller. The major problem here is his 2 drain sites 1 on the left upper calf and lateral thigh. There is purulent drainage still from the left lateral thigh. I gave him antibiotics last week but we still have recultured. He has the drain in the area I think this is eventually going to have to come out. I suspect there will be a connecting wound to heal here perhaps with improved VAc 1/26; the patient had his drain removed by vein and vascular on 1/25/. This was a large pocket of fluid in his left thigh that seem to tunnel into his left upper calf. 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 His mention his Penrose drain was removed today. He now has a tunneling wound on his left calf and left thigh. Both  of these probe widely towards each other although I cannot really prove that they connect. Both wounds on his lower leg anteriorly are closed and his area over the first metatarsal head on his right foot continues to improve. We are using Hydrofera Blue here. He also saw infectious disease culture of the abscess they noted was polymicrobial with MRSA, Morganella and Klebsiella he was treated with doxycycline and ciprofloxacin for 4 weeks ending on 07/03/2021. They did not recommend any further antibiotics. Notable that while he still had the Penrose drain in place last week he had purulent drainage coming out of the inferior IandD site this grew [facility], MRSA and Pseudomonas but there does not appear to be any active infection in this area today with the drain out and he is not systemically unwell 2/2; with regards to the drain sites the superior one on the thigh actually is closed down the one on the upper left lateral calf measures about 8 and half centimeters which is an improvement seems to be less prominent although still with a lot of drainage. The only remaining wound is over the first metatarsal head on the left foot and this looks to be continuing to improve with Hydrofera Blue. 2/9; the area on his plantar left foot continues to contract. Callus around the wound edge. The drain sites specifically have not come down in depth. We put the wound VAC on Monday he changed the canister late last night our intake nurse reported a pocket of fluid perhaps caused by our compression wraps 2/16; continued improvement in left foot plantar wound. drainage site in the calf is not improved in terms of depth (wound vac) 2/23; continued improvement in the left foot wound over the first metatarsal head. With regards to the drain sites the area on his thigh laterally is healed however the open area on his calf is small in terms of circumference by still probes in by about 15 cm. Within using the wound VAC.  Hydrofera Blue on his foot 08/24/2021: The left first metatarsal head wound continues to improve. The wound bed is healthy with just some surrounding callus. Unfortunately the open drain site on his calf remains open and  tunnels at least 15 cm (the extent of a Q-tip). This is despite several weeks of wound VAC treatment. Based on reading back through the notes, there has been really no significant change in the depth of the wound, although the orifice is smaller and the more cranial wound on his thigh has closed. I suspect the tunnel tracks nearly all the way to this location. 08/31/2021: Continued improvement in the left first metatarsal head wound. There has been absolutely no improvement to the long tunnel from his open drain site on his calf. We have tried to get him into see vascular surgery sooner to consider the possibility of simply filleting the tract open and allowing it to heal from the bottom up, likely with a wound VAC. They have not yet scheduled a sooner appointment than his current mid April 09/14/2021: He was seen by vascular surgery and they took him to the operating room last week. They opened a portion of the tunnel, but did not extend the entire length of the known open subcutaneous tract. I read Dr. Claretha Cooper operative note and it is not clear from that documentation why only a portion of the tract was opened. The heaped up granulation tissue was curetted and removed from at least some portion of the tract. They did place a wound VAC and applied an Unna boot to the leg. The ulcer on his left first metatarsal head is smaller today. The bed looks good and there is just a small amount of surrounding callus. 09/21/2021: The ulcer on his left first metatarsal head looks to be stalled. There is some callus surrounding the wound but the wound bed itself does not appear particularly dynamic. The tunnel tract on his lateral left leg seems to be roughly the same length or perhaps slightly smaller  but the wound bed appears healthy with good granulation tissue. He opened up a new wound on his medial thigh and the site of a prior surgical incision. He says that he did this unconsciously in his sleep by scratching. 09/28/2021: Unfortunately, the ulcer on his left first metatarsal head has extended underneath the callus toward the dorsum of his foot. The medial thigh wounds are roughly the same. The tunnel on his lateral left leg continues to be problematic; it is longer than we are able to actually probe with a Q-tip. I am still not certain as to why Dr. Donzetta Matters did not open this up entirely when he took the patient to the operating room. We will likely be back in the same situation with just a small superficial opening in a long unhealed tract, as the open portion is granulating in nicely. 10/02/2021: The patient was initially scheduled for a nurse visit, but we are also applying a total contact cast today. The plantar foot wound looks clean without significant accumulated callus. We have been applying Prisma silver collagen to the site. [name], [name] ([id number]) [id number] Page 11 of 14 10/05/2021: The patient is here for his first total contact cast change. We have tried using gauze packing strips in the tunnel on his lateral leg wound, but this does not seem to be working any better than the white VAC foam. The foot ulcer looks about the same with minimal periwound callus. Medial thigh wound is clean with just some overlying eschar. 10/12/2021: The plantar foot wound is stable without any significant accumulation of periwound callus. The surface is viable with good granulation tissue. The medial thigh wounds are much smaller and are epithelializing. On the  other hand, he had purulent drainage coming from the tunnel on his lateral leg. He does go back to see Dr. Donzetta Matters next week and is planning to ask him why the wound tunnel was not completely opened at the time of his most  recent operation. 10/19/2021: The plantar foot wound is markedly improved and has epithelial tissue coming through the surface. The medial thigh wounds are nearly closed with just a tiny open area. He did see Dr. Donzetta Matters earlier this week and apparently they did discuss the possibility of opening the sinus tract further and enabling a wound VAC application. Apparently there are some limits as to what Dr. 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 I think if we could get the tract open to the level of the popliteal fossa, this would greatly aid in her ability to get this chart closed. That being said, however, today when I probed the tract with a Q-tip, I was not able to insert the entirety of the Q-tip as I have on previous occasions. The tunnel is shorter by about 4 cm. The surface is clean with good granulation tissue and no further episodes of purulent drainage. 10/30/2021: Last week, the patient underwent surgery and had the long tract in his leg opened. There was a rind that was debrided, according to the operative report. His medial thigh ulcers are closed. The plantar foot wound is clean with a good surface and some built up surrounding callus. 11/06/2021: The overall dimensions of the large wound on his lateral leg remain about the same, but there is good granulation tissue present and the tunneling is a little bit shorter. He has a new wound on his anterior tibial surface, in the same location where he had a similar lesion in the past. The plantar foot wound is clean with some buildup surrounding callus. Just toward the medial aspect of his foot, however, there is an area of darkening that once debrided, revealed another opening in the skin surface. 11/13/2021: The anterior tibial surface wound is closed. The plantar foot wound has some surrounding callus buildup. The area of darkening that I debrided last week and revealed an opening in the skin surface has closed  again. The tunnel in the large wound on his lateral leg has come in by about 3 cm. There is healthy granulation tissue on the entire wound surface. 11/23/2021: The patient was out of town last week and did wet-to-dry dressings on his large wound. 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 Unfortunately, he picked open the wound on his left medial thigh. He says that it was itching and he just could not stop scratching it until it was open again. The wound on his plantar foot is smaller and has not accumulated a tremendous amount of callus. The lateral leg wound is shallower and the tunnel has also decreased in depth. There is just a little bit of slough accumulation on the surface. 11/30/2021: Another portion of his left medial thigh has been opened up. All of these wounds are fairly superficial with just a little bit of slough and eschar accumulation. The wound on his plantar foot is almost closed with just a bit of eschar and periwound callus accumulation. The lateral leg wound is nearly flush with the surrounding skin and the tunnel is markedly shallower. 12/07/2021: There is just 1 open area on his left medial thigh. It is clean with just  a little bit of perimeter eschar. The wound on his plantar foot continues to contract and just has some eschar and periwound callus accumulation. The lateral leg wound is closing at the more distal aspect and the tunnel is smaller. The surface is nearly flush with the surrounding skin and it has a good bed of granulation tissue. 12/14/2021: The thigh and foot wounds are closed. The lateral leg wound has closed over approximately half of its length. The tunnel continues to contract and the surface is now flush with the surrounding skin. The wound bed has robust granulation tissue. 12/22/2021: The thigh and foot wounds have reopened. The foot wound has a lot of callus accumulation around and over it. The  thigh wound is tiny with just a little bit of slough in the wound bed. The lateral leg wound continues to contract. His vascular surgeon took the wound VAC off earlier in the week and the patient has been doing wet-to-dry dressings. There is a little slough accumulation on the surface. The tunnel is about 3 cm in depth at this point. 12/28/2021: The thigh wound is closed again. The foot wound has some callus that subsequently has peeled back exposing just a small slit of a wound. The lateral leg wound Is down to about half the size that it originally was and the tunnel is down to about half a centimeter in depth. 01/04/2022: The thigh wound remains closed. The foot wound has heavy callus overlying the wound site. Once this was debrided, the wound was found to be closed. The lateral leg wound is smaller again this week and very superficial. No tunnel could be identified. 01/12/2022: The thigh and foot wounds both remain closed. The lateral leg wound is now nearly flush with the skin surface. There is good granulation tissue present with a light layer of slough. 01/19/2022: Due to the way his wrap was placed, the patient did not change the dressing on his thigh at all and so the foam was saturated and his skin is macerated. There is a light layer of slough on the wound surface. The underlying granulation tissue is robust and healthy-appearing. He has heavy callus buildup at the site of his first metatarsal head wound which is still healed. 02/01/2022: He has been in silver alginate. When he removed the dressing from his thigh wound, however, some leg, superficially reopening a portion of the wound that had healed. In addition, underneath the callus at his left first metatarsal head, there appears to be a blister and the wound appears to be open again. 02/08/2022: The lateral leg wound has contracted substantially. There is eschar and a light layer of slough present. He says that it is starting to pull and  is uncomfortable. On inspection, there is some puckering of the scar and the eschar is quite dry; this may account for his symptoms. On his first metatarsal head, the wound is much smaller with just some eschar on the surface. The callus has not reaccumulated. He reports that he had a blister come up on his medial thigh wound at the distal aspect. It popped and there is now an opening in his skin again. Looking back through his [place] of wound photos, there is what looks like a permanent suture just deep to this location and it may be trying to erode through. We have been using silver alginate on his wounds. 02/15/2022: The lateral leg wound is about half the size it was last week. It is clean with just a little  perimeter eschar and light slough. The wound on his first metatarsal head is about the same with heavy callus overlying it. The medial thigh wound is closed again. He does have some skin changes on the top of his foot that looks potentially yeast related. 02/22/2022: The skin on the top of his foot improved with the use of a topical antifungal. The lateral leg wound continues to contract and is again smaller this week. There is a little bit of slough and eschar on the surface. The first metatarsal head wound is a little bit smaller but has reaccumulated a thick callus over the top. He decided to try to trim his toenail and ultimately took the entire nail off of his left great toe. 03/02/2022: His lateral leg wound continues to improve, as does the wound on his left great toe. Unfortunately, it appears that somehow his foot got wet and moisture seeped in through the opening causing his skin to lift. There is a large wound now overlying his first metatarsal on both the plantar, medial, and dorsal portion of his foot. There is necrotic tissue and slough present underneath the shaggy macerated skin. 03/08/2022: The lateral leg wound is smaller again today. There is just a light layer of slough and  eschar on the surface. The great toe wound is smaller again today. The first metatarsal wound is a little bit smaller today and does not look nearly as necrotic and macerated. There is still slough and nonviable tissue present. 03/15/2022: The lateral leg wound is narrower and just has a little bit of light slough buildup. The first metatarsal wound still has a fair amount of moisture affecting the periwound skin. The great toe wound is healed. 03/22/2022: The lateral leg wound is now isolated to just at the level of his knee. There is some eschar and slough accumulation. The first metatarsal head wound has epithelialized tremendously and is about half the size that it was last week. He still has some maceration on the top of his foot and a fungal odor is present. [name], [name] ([id number]) [id number] Page 12 of 14 03/29/2022: T oday the patient's foot was macerated, suggesting that the cast got wet. The patient has also been picking at his dry skin and has enlarged the wound on his left lateral leg. In the time between having his cast removed and my evaluation, he had picked more dry skin and opened up additional wounds on his Achilles area and dorsal foot. The plantar first metatarsal head wound, however, is smaller and clean with just macerated callus around the perimeter and light slough on the surface. The lateral leg wound measured a little bit larger but is also fairly clean with eschar and minimal slough. 04/02/2022: The patient had vascular studies done last Friday and so his cast was not applied. He is here today to have that done. 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 04/05/2022: Both wounds are smaller and quite clean. There is just a little biofilm on the lateral leg wound. 10/20; the patient has a wound on the left lateral surgical incision at the level of his lateral knee this looks clean and improved. He is using silver alginate. He also has  an area on his left medial foot for which she is using Hydrofera Blue under a total contact cast both wounds are measuring smaller 04/20/2022: The plantar foot wound has contracted considerably and is very close to closing. The lateral leg wound was measured a little larger, but  there was a tiny open area that was included in the measurements that was not included last week. He has some eschar around the perimeter but otherwise the wound looks clean. 04/27/2022: The lateral leg wound looks better this week. He says that midweek, he felt it was very dry and began applying hydrogel to the site. I think this was beneficial. The foot wound is nearly closed underneath a thick layer of dry skin and callus. 05/04/2022: The foot wound is healed. He has developed a new small ulcer on his anterior tibial surface about midway up his leg. It has a little slough on the surface. The lateral leg wound still is fairly dry, but clean with just a little biofilm on the surface. 05/11/2022: The wound on his foot reopened on Wednesday. A large blister formed which then broke open revealing the fat layer underneath. The ulcer on his anterior tibial surface is a little bit larger this week. The lateral leg wound has much better moisture balance this week. Fortunately, prior to his foot wound reopening, he did get the cast made for his orthotic. 05/15/2022: Already, the left medial foot wound has improved. The tissue is less macerated and the surface is clean. The ulcer on his anterior tibial surface continues to enlarge. This seems likely secondary to accumulated moisture. The lateral leg wound continues to have an improved moisture balance with the use of collagen. 05/25/2022: The medial foot wound continues to contract. It is now substantially smaller with just a little slough on the surface. The anterior tibial surface wound continues to enlarge further. Once again, this seems to be secondary to moisture. The lateral leg  wound does not seem to be changing much in size, but the moisture balance is better. 06/01/2022: The anterior tibial wound is closed. The medial foot wound is down to just a very small, couple of millimeters, opening. The lateral leg wound has good moisture balance, but remains unchanged in size. 12/15; the patient's anterior tibial wound has reopened, however the area on his right first metatarsal head is closed. The major wound is actually on the superior part of his surgical wound in the left lateral thigh. Not a completely viable surface under illumination. This may at some point require a debridement I think he is currently using Prisma. As noted the left medial foot wound has closed Objective Constitutional Sitting or standing Blood Pressure is within target range for patient.. Supine Blood Pressure is within target range for patient.. Pulse regular and within target range for patient.Marland Kitchen Appears in no distress. Vitals Time Taken: 11:15 AM, Height: 74 in, Weight: 238 lbs, BMI: 30.6, Temperature: 98.2 F, Pulse: 71 bpm, Respiratory Rate: 20 breaths/min, Blood Pressure: 127/76 mmHg, Capillary Blood Glucose: 238 mg/dl. General Notes: Wound exam; the anterior tibial wound is reopened. I wonder if this is traumatic or secondary to his known chronic venous insufficiency. The area on the left first metatarsal head is closed there is nothing here. And then finally he has a extensive wound in the superior one third of his surgical wound which was an abscess Integumentary (Hair, Skin) Wound #22 status is Open. Original cause of wound was Bump. The date acquired was: 06/03/2021. The wound has been in treatment 52 weeks. The wound is located on the Left,Proximal,Lateral Lower Leg. The wound measures 4.8cm length x 1.3cm width x 0.1cm depth; 4.901cm^2 area and 0.49cm^3 volume. There is Fat Layer (Subcutaneous Tissue) exposed. There is no tunneling or undermining noted. There is a medium amount of  serous  drainage noted. The wound margin is fibrotic, thickened scar. There is medium (34-66%) pink granulation within the wound bed. There is a medium (34-66%) amount of necrotic tissue within the wound bed including Eschar and Adherent Slough. The periwound skin appearance exhibited: Scarring, Dry/Scaly, Hemosiderin Staining. Periwound temperature was noted as No Abnormality. The periwound has tenderness on palpation. Wound #27R status is Healed - Epithelialized. Original cause of wound was Blister. The date acquired was: 02/01/2022. The wound has been in treatment 18 weeks. The wound is located on the Left,Medial Foot. The wound measures 0cm length x 0cm width x 0cm depth; 0cm^2 area and 0cm^3 volume. There is no tunneling or undermining noted. There is a none present amount of drainage noted. The wound margin is distinct with the outline attached to the wound base. There is no granulation within the wound bed. There is no necrotic tissue within the wound bed. The periwound skin appearance had no abnormalities noted for texture. The periwound skin appearance had no abnormalities noted for moisture. The periwound skin appearance had no abnormalities noted for color. Periwound temperature was noted as No Abnormality. Wound #29 status is Open. Original cause of wound was Gradually Appeared. The date acquired was: 06/04/2022. The wound is located on the Left,Anterior Lower Leg. The wound measures 2.5cm length x 1.6cm width x 0.1cm depth; 3.142cm^2 area and 0.314cm^3 volume. There is Fat Layer (Subcutaneous Tissue) exposed. There is no tunneling or undermining noted. There is a medium amount of serosanguineous drainage noted. There is large (67-100%) red granulation within the wound bed. The periwound skin appearance exhibited: Scarring, Dry/Scaly, Hemosiderin Staining. Periwound temperature was noted as No Abnormality. 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 There was a Three Layer Compression Therapy Procedure by Dellie Catholic, RN. Post procedure Diagnosis Wound #29: Same as Pre-Procedure Plan Follow-up Appointments: Return Appointment in 1 week. - Dr Celine Ahr - Room 1 Other: - [facility] next week 06/13/22 Anesthetic: Wound #22 Left,Proximal,Lateral Lower Leg: (In clinic) Topical Lidocaine 4% applied to wound bed Bathing/ Shower/ Hygiene: May shower with protection but do not get wound dressing(s) wet. - Use a cast protector so you can shower without getting your cast wet Edema Control - Lymphedema / SCD / Other: Elevate legs to the level of the heart or above for 30 minutes daily and/or when sitting, a frequency of: - throughout the day Avoid standing for long periods of time. Patient to wear own compression stockings every day. - on right leg; Moisturize legs daily. Compression stocking or Garment 20-30 mm/Hg pressure to: - left leg daily Off-Loading: T Contact Cast to Left Lower Extremity otal Other: - minimal weight bearing left foot Non Wound Condition: Other Non Wound Condition Orders/Instructions: - Place Optifoam on this Left medial foot before 3 layered compression WOUND #22: - Lower Leg Wound Laterality: Left, Lateral, Proximal Cleanser: Soap and Water 3 x Per Week/30  Days Discharge Instructions: May shower and  wash wound with dial antibacterial soap and water prior to dressing change. Cleanser: Wound Cleanser 3 x Per Week/30 Days Discharge Instructions: Cleanse the wound with wound cleanser prior to applying a clean dressing using gauze sponges, not tissue or cotton balls. Peri-Wound Care: Sween Lotion (Moisturizing lotion) 3 x Per Week/30 Days Discharge Instructions: Apply moisturizing lotion to the leg Topical: Skintegrity Hydrogel 4 (oz) 3 x Per Week/30 Days Discharge Instructions: Apply hydrogel as directed Prim Dressing: Endoform 2x2 in 3 x Per Week/30 Days ary Discharge Instructions: Moisten with Hydrogel or saline Secondary Dressing: ABD Pad, 8x10 (Generic) 3 x Per Week/30 Days Discharge Instructions: Apply over primary dressing as directed. Secured With: Elastic Bandage 4 inch (ACE bandage) 3 x Per Week/30 Days Discharge Instructions: Secure with ACE bandage as directed. WOUND #29: - Lower Leg Wound Laterality: Left, Anterior Peri-Wound Care: Sween Lotion (Moisturizing lotion) 1 x Per Week/30 Days Discharge Instructions: Apply moisturizing lotion as directed Prim Dressing: Sorbalgon AG Dressing 2x2 (in/in) 1 x Per Week/30 Days ary Discharge Instructions: Apply to wound bed as instructed Secured With: 31M Medipore Soft Cloth Surgical T 2x10 (in/yd) 1 x Per Week/30 Days ape Discharge Instructions: Secure with tape as directed. Com pression Wrap: ThreePress (3 layer compression wrap) 1 x Per Week/30 Days Discharge Instructions: Apply three layer compression as directed. 1. Wound exam showed a healed right first metatarsal head. Will keep this area padded under compression wrap for the traumatic/venous insufficiency wound in the mid tibia 2. I am continuing Prisma on the thigh wound although I am wondering if this is going to need a more aggressive debridement at some point Electronic Signature(s) Signed: 06/08/2022 4:40:59 PM By: Linton Ham  MD [name] ([id number]) [phone].pdf Page 14 of 14 Signed: 06/08/2022 4:40:59 PM By: Linton Ham MD Entered By: Linton Ham on 06/08/2022 13:04:56 -------------------------------------------------------------------------------- SuperBill Details Patient Name: Date of Service: [name] 06/08/2022 Medical Record Number: [id number] Patient Account Number: [id number] Date of Birth/Sex: Treating RN: Apr 28, 1951 (71 y.o. [name] Primary Care Provider: [name] Other Clinician: Referring Provider: Treating Provider/Extender: Stormy Card in Treatment: 60 Diagnosis Coding ICD-10 Codes Code Description E11.621 Type 2 diabetes mellitus with foot ulcer 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 I89.0 Lymphedema, not elsewhere classified I87.322 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verity L97.528 Non-pressure chronic ulcer of other part of left foot with other specified severity Facility Procedures : CPT4 Code: [id number] Description: (Facility Use Only) [id number] - [place] LWR LT LEG Modifier: Quantity: 1 Physician Procedures : CPT4 Code Description Modifier [id number] - WC PHYS LEVEL 3 - EST PT ICD-10 Diagnosis Description E11.621 Type 2 diabetes mellitus with foot ulcer L97.828 Non-pressure chronic ulcer of other part of left lower leg with other specified severity  L97.528 Non-pressure chronic ulcer of other part of left foot with other specified severity 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 Quantity: 1 Electronic Signature(s) Signed: 06/08/2022 4:40:59 PM By: Linton Ham MD Entered By: Linton Ham on 06/08/2022 13:05:23

## 2022-06-14 ENCOUNTER — Encounter (HOSPITAL_BASED_OUTPATIENT_CLINIC_OR_DEPARTMENT_OTHER): Payer: Medicare Other | Admitting: General Surgery

## 2022-06-14 DIAGNOSIS — E11621 Type 2 diabetes mellitus with foot ulcer: Secondary | ICD-10-CM | POA: Diagnosis not present

## 2022-06-14 NOTE — Progress Notes (Signed)
RANDAL, GOENS ([id number]) [id number] Page 1 of 8 Visit Report for 06/14/2022 Arrival Information Details Patient Name: Date of Service: [name], [name] 06/14/2022 10:30 A M Medical Record Number: [id number] Patient Account Number: [id number] Date of Birth/Sex: Treating RN: 07-14-1950 (71 y.o. M) Primary Care Provider: [name] Other Clinician: Referring Provider: Treating Provider/Extender: [name] in Treatment: 37 Visit Information History Since Last Visit All ordered tests and consults were completed: No Patient Arrived: Ambulatory Added or deleted any medications: No Arrival Time: 10:00 Any new allergies or adverse reactions: No Accompanied By: self Had a fall or experienced change in No Transfer Assistance: None activities of daily living that may affect Patient Identification Verified: Yes risk of falls: Secondary Verification Process Completed: Yes Signs or symptoms of abuse/neglect since last visito No Patient Requires Transmission-Based Precautions: No Hospitalized since last visit: No Patient Has Alerts: Yes Implantable device outside of the clinic excluding No cellular tissue based products placed in the center since last visit: Pain Present Now: No Electronic Signature(s) Signed: 06/14/2022 11:58:36 AM By: Worthy Rancher Entered By: Worthy Rancher on 06/14/2022 10:01:25 -------------------------------------------------------------------------------- Lower Extremity Assessment Details Patient Name: Date of Service: [name], [name] 06/14/2022 10:30 A M Medical Record Number: [id number] Patient Account Number: [id number] Date of Birth/Sex: Treating RN: [birth year] (71 y.o. [name] Primary Care Provider: [name] Other Clinician: Referring Provider: Treating Provider/Extender: [name] in Treatment: 61 Edema Assessment Assessed: [Left: No] [Right: No] Edema: [Left: Ye]  [Right: s] Calf Left: Right: Point of Measurement: 41 cm From Medial Instep 45 cm Ankle Left: Right: Point of Measurement: 10 cm From Medial Instep 28.5 cm Vascular Assessment Pulses: Dorsalis Pedis Palpable: [Left:Yes] [Right:[id number] Page 2 of 8] Electronic Signature(s) Signed: 06/14/2022 4:49:39 PM By: Blanche East RN Entered By: Blanche East on 06/14/2022 10:29:44 -------------------------------------------------------------------------------- Multi Wound Chart Details Patient Name: Date of Service: [name] Garfinkel. 06/14/2022 10:30 A M Medical Record Number: [id number] Patient Account Number: [id number] Date of Birth/Sex: Treating RN: 1950-11-01 (71 y.o. 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 Primary Etiology: Extremity Glaucoma, Sleep Apnea, Glaucoma, Sleep Apnea, Glaucoma, Sleep Apnea, Comorbid History: Hypertension, Peripheral Arterial Hypertension, Peripheral Arterial Hypertension, Peripheral Arterial Disease, Peripheral Venous Disease, Disease, Peripheral Venous Disease, Disease, Peripheral Venous Disease, Type II Diabetes, Gout, Osteoarthritis, Type II Diabetes, Gout, Osteoarthritis, Type II Diabetes, Gout, Osteoarthritis, Neuropathy Neuropathy Neuropathy 08/23/2020 06/03/2021 06/04/2022 Date Acquired: 61 53 0 Weeks of Treatment: Open Open Healed -  Epithelialized Wound Status: No No No Wound Recurrence: No Yes No Clustered Wound: N/A 3 N/A Clustered Quantity: 1x1x0.1 6x1.5x0.1 0x0x0 Measurements L x W x D (cm) 0.785 7.069 0 A (cm) : rea 0.079 0.707 0 Volume (cm) : 94.40% -328.70% 100.00% % Reduction in A rea: 97.20% 46.40% 100.00% % Reduction in Volume: Grade 2 Full Thickness With Exposed Support Full Thickness Without Exposed Classification: Structures Support Structures Small Medium None Present Exudate A mount: Serosanguineous Serous N/A Exudate Type: red, brown amber N/A Exudate Color: Flat and Intact Fibrotic scar, thickened scar N/A Wound Margin: Medium (34-66%) Medium (34-66%)  Large (67-100%) Granulation A mount: N/A Pink Red Granulation Quality: Medium (34-66%) Medium (34-66%) None Present (0%) Necrotic A mount: Adherent Slough Eschar, Adherent Slough N/A Necrotic Tissue: Fascia: No Fat Layer (Subcutaneous Tissue): Yes Fascia: No Exposed Structures: Fat Layer (Subcutaneous Tissue): No Fascia: No Fat Layer (Subcutaneous Tissue): No Tendon: No Tendon: No Tendon: No Muscle: No Muscle: No Muscle: No Joint: No Joint: No Joint: No Bone: No Bone: No Bone: No Large (67-100%) Small (1-33%) N/A Epithelialization: N/A Debridement - Selective/Open Wound N/A Debridement: Pre-procedure Verification/Time Out N/A 10:41 N/A Taken: [name] ([id number]) [id number] Page 3 of 8 N/A Lidocaine 4% Topical Solution N/A Pain Control: N/A Slough N/A Tissue Debrided: N/A Non-Viable Tissue N/A Level: N/A 9 N/A Debridement A (sq cm): rea N/A Curette N/A Instrument: N/A Minimum N/A Bleeding: N/A Pressure N/A Hemostasis A chieved: N/A Procedure was tolerated well N/A Debridement Treatment Response: N/A 6x1.5x0.1 N/A Post Debridement Measurements L x W x D (cm) N/A 0.707 N/A Post Debridement Volume: (cm) No Abnormalities Noted Scarring: Yes Scarring: Yes Periwound  Skin Texture: Dry/Scaly: Yes Dry/Scaly: Yes Dry/Scaly: Yes Periwound Skin Moisture: Maceration: No No Abnormalities Noted Hemosiderin Staining: Yes Hemosiderin Staining: Yes Periwound Skin Color: N/A No Abnormality No Abnormality Temperature: N/A Yes N/A Tenderness on Palpation: T Contact Cast otal Debridement N/A Procedures Performed: Treatment Notes Electronic Signature(s) Signed: 06/14/2022 10:49:34 AM By: Fredirick Maudlin MD FACS Entered By: Fredirick Maudlin on 06/14/2022 10:49:34 -------------------------------------------------------------------------------- Multi-Disciplinary Care Plan Details Patient Name: Date of Service: [name] Garfinkel. 06/14/2022 10:30 A M Medical Record Number: [id number] Patient Account Number: [id number] Date of Birth/Sex: Treating RN: 30-Sep-1950 (71 y.o. [name] Primary Care Maximiano Lott: [name] Other Clinician: Referring Ammon Muscatello: Treating Tal Neer/Extender: [name] in Treatment: 58 Multidisciplinary Care Plan reviewed with physician Active Inactive Venous Leg Ulcer Nursing Diagnoses: Knowledge deficit related to disease process and management Potential for venous Insuffiency (use before diagnosis confirmed) Goals: Patient will maintain optimal edema control Date Initiated: 07/27/2021 Target Resolution Date: 02/23/2023 Goal Status: Active Interventions: Assess peripheral edema status every visit. Treatment Activities: Therapeutic compression applied : 07/27/2021 Notes: Wound/Skin Impairment Nursing Diagnoses: Impaired tissue integrity Knowledge deficit related to ulceration/compromised skin integrity Goals: Patient will have a decrease in wound volume by X% from date: (specify in notes) Date Initiated: 04/12/2021 Date Inactivated: 01/04/2022 Target Resolution Date: 04/23/2021 Goal Status: Met Patient/caregiver will verbalize understanding of skin care regimen [name], [name] ([id number])  [phone].pdf Page 4 of 8 Date Initiated: 01/04/2022 Target Resolution Date: 02/23/2023 Goal Status: Active Ulcer/skin breakdown will have a volume reduction of 30% by week 4 Date Initiated: 04/12/2021 Date Inactivated: 04/27/2021 Target Resolution Date: 04/27/2021 Goal Status: Unmet Unmet Reason: infection Ulcer/skin breakdown will have a volume reduction of 50% by week 8 Date Initiated: 04/27/2021 Date Inactivated: 06/29/2021 Target Resolution Date: 06/24/2021 Goal Status: Met Interventions: Assess patient/caregiver ability to obtain necessary supplies Assess patient/caregiver ability to perform ulcer/skin care regimen upon admission and as needed Assess ulceration(s) every visit Notes: Electronic Signature(s) Signed: 06/14/2022 4:49:39 PM By: Blanche East RN Entered By: Blanche East on 06/14/2022 10:32:52 -------------------------------------------------------------------------------- Pain Assessment Details Patient Name: Date of Service: [name] Garfinkel. 06/14/2022 10:30 A M Medical Record Number: [id number] Patient Account Number: [id number] Date of Birth/Sex: Treating RN: 06/23/1951 (71 y.o. M) Primary Care Paylin Hailu: [name] Other Clinician: Referring Shyloh Krinke: Treating Westly Hinnant/Extender: [name] in Treatment: 78 Active Problems Location of Pain Severity and Description of Pain Patient Has Paino No Site Locations Pain  Management and Medication Current Pain Management: Electronic Signature(s) Signed: 06/14/2022 11:58:36 AM By: Worthy Rancher Entered By: Worthy Rancher on 06/14/2022 10:02:01 [name] ([id number]) [id number] Page 5 of 8 -------------------------------------------------------------------------------- Patient/Caregiver Education Details Patient Name: Date of Service: [name], [name] 12/21/2023andnbsp10:30 A M Medical Record Number: [id number] Patient Account Number:  [id number] Date of Birth/Gender: Treating RN: 09-03-1950 (72 y.o. [name] Primary Care Physician: [name] Other Clinician: Referring Physician: Treating Physician/Extender: [name] in Treatment: 74 Education Assessment Education Provided To: Patient Education Topics Provided Wound Debridement: Methods: Explain/Verbal Responses: Reinforcements needed Wound/Skin Impairment: Methods: Explain/Verbal Responses: Reinforcements needed, State content correctly Electronic Signature(s) Signed: 06/14/2022 4:49:39 PM By: Blanche East RN Entered By: Blanche East on 06/14/2022 10:33:51 -------------------------------------------------------------------------------- Wound Assessment Details Patient Name: Date of Service: [name] Garfinkel. 06/14/2022 10:30 A M Medical Record Number: [id number] Patient Account Number: [id number] Date of Birth/Sex: Treating RN: 09-16-1950 (71 y.o. [name] Primary Care Provider: [name] Other Clinician: Referring Provider: Treating Provider/Extender: [name] in Treatment: 61 Wound Status Wound Number: 18 Primary Diabetic Wound/Ulcer of the Lower Extremity Etiology: Wound Location: Left, Plantar Metatarsal head first Wound Open Wounding Event: Gradually Appeared Status: Date Acquired: 08/23/2020 Comorbid Glaucoma, Sleep Apnea, Hypertension, Peripheral Arterial Disease, Weeks Of Treatment: 61 History: Peripheral Venous Disease, Type II Diabetes, Gout, Osteoarthritis, Clustered Wound: No Neuropathy Wound Measurements Length: (cm) 1 Width: (cm) 1 Depth: (cm) 0.1 Area: (cm) 0.785 Volume: (cm) 0.079 % Reduction in Area: 94.4% % Reduction in Volume: 97.2% Epithelialization: Large (67-100%) Tunneling: No Undermining: No Wound Description Classification: Grade 2 Wound Margin: Flat and Intact Exudate Amount: Small Exudate Type: Serosanguineous Exudate Color: red,  brown ZARIAH, JOST ([id number]) Foul Odor After Cleansing: No Slough/Fibrino Yes [id number] Page 6 of 8 Wound Bed Granulation Amount: Medium (34-66%) Exposed Structure Necrotic Amount: Medium (34-66%) Fascia Exposed: No Necrotic Quality: Adherent Slough Fat Layer (Subcutaneous Tissue) Exposed: No Tendon Exposed: No Muscle Exposed: No Joint Exposed: No Bone Exposed: No Periwound Skin Texture Texture Color No Abnormalities Noted: Yes No Abnormalities Noted: Yes Moisture No Abnormalities Noted: No Dry / Scaly: Yes Maceration: No Electronic Signature(s) Signed: 06/14/2022 4:49:39 PM By: Blanche East RN Entered By: Blanche East on 06/14/2022 10:32:20 -------------------------------------------------------------------------------- Wound Assessment Details Patient Name: Date of Service: [name] Garfinkel. 06/14/2022 10:30 A M Medical Record Number: [id number] Patient Account Number: [id number] Date of Birth/Sex: Treating RN: 04-13-1951 (71 y.o. M) Primary Care Provider: [name] Other Clinician: Referring Provider: Treating Provider/Extender: [name] in Treatment: 61 Wound Status Wound Number: 22 Primary Cyst Etiology: Wound Location: Left, Proximal, Lateral Lower Leg Wound Open Wounding Event: Bump Status: Date Acquired: 06/03/2021 Comorbid Glaucoma, Sleep Apnea, Hypertension, Peripheral Arterial Disease, Weeks Of Treatment: 53 History: Peripheral Venous Disease, Type II Diabetes, Gout, Osteoarthritis, Clustered Wound: Yes Neuropathy Photos Wound Measurements Length: (cm) Width: (cm) Depth: (cm) Clustered Quantity: Area: (cm) Volume: (cm) 6 % Reduction in Area: -328.7% 1.5 % Reduction in Volume: 46.4% 0.1 Epithelialization: Small (1-33%) 3 7.069 0.707 Wound Description Classification: Full Thickness With Exposed Support Structures Wound Margin: Fibrotic scar, thickened scar [name]  ([id number]) Exudate Amount: Medium Exudate Type: Serous Exudate Color: amber Foul Odor After Cleansing: No Slough/Fibrino No [id number] Page 7 of 8 Wound Bed Granulation Amount: Medium (34-66%) Exposed Structure Granulation Quality: Pink Fascia Exposed: No Necrotic Amount: Medium (34-66%) Fat Layer (Subcutaneous Tissue) Exposed: Yes Necrotic Quality: Eschar, Adherent Slough Tendon Exposed: No Muscle Exposed: No  Joint Exposed: No Bone Exposed: No Periwound Skin Texture Texture Color No Abnormalities Noted: No No Abnormalities Noted: No Scarring: Yes Hemosiderin Staining: Yes Moisture Temperature / Pain No Abnormalities Noted: No Temperature: No Abnormality Dry / Scaly: Yes Tenderness on Palpation: Yes Electronic Signature(s) Signed: 06/14/2022 11:58:36 AM By: Worthy Rancher Entered By: Worthy Rancher on 06/14/2022 10:13:36 -------------------------------------------------------------------------------- Wound Assessment Details Patient Name: Date of Service: [name], [name] 06/14/2022 10:30 A M Medical Record Number: [id number] Patient Account Number: [id number] Date of Birth/Sex: Treating RN: 1950-09-12 (71 y.o. [name] Primary Care Arthurine Oleary: [name] Other Clinician: Referring Cendy Oconnor: Treating Quintin Hjort/Extender: [name] in Treatment: 61 Wound Status Wound Number: 29 Primary Abrasion Etiology: Wound Location: Left, Anterior Lower Leg Wound Healed - Epithelialized Wounding Event: Gradually Appeared Status: Date Acquired: 06/04/2022 Comorbid Glaucoma, Sleep Apnea, Hypertension, Peripheral Arterial Disease, Weeks Of Treatment: 0 History: Peripheral Venous Disease, Type II Diabetes, Gout, Osteoarthritis, Clustered Wound: No Neuropathy Photos Wound Measurements Length: (cm) Width: (cm) Depth: (cm) Area: (cm) Volume: (cm) 0 % Reduction in Area: 100% 0 % Reduction in Volume: 100% 0 Tunneling:  No 0 Undermining: No 0 Wound Description [name], [name] ([id number]) Classification: Full Thickness Without Exposed Suppor Exudate Amount: None Present [id number] Page 8 of 8 t Structures Foul Odor After Cleansing: No Slough/Fibrino No Wound Bed Granulation Amount: Large (67-100%) Exposed Structure Granulation Quality: Red Fascia Exposed: No Necrotic Amount: None Present (0%) Fat Layer (Subcutaneous Tissue) Exposed: No Tendon Exposed: No Muscle Exposed: No Joint Exposed: No Bone Exposed: No Periwound Skin Texture Texture Color No Abnormalities Noted: No No Abnormalities Noted: No Scarring: Yes Hemosiderin Staining: Yes Moisture Temperature / Pain No Abnormalities Noted: No Temperature: No Abnormality Dry / Scaly: Yes Electronic Signature(s) Signed: 06/14/2022 4:49:39 PM By: Blanche East RN Entered By: Blanche East on 06/14/2022 10:47:16 -------------------------------------------------------------------------------- Vitals Details Patient Name: Date of Service: [name] Garfinkel. 06/14/2022 10:30 A M Medical Record Number: [id number] Patient Account Number: [id number] Date of Birth/Sex: Treating RN: January 04, 1951 (71 y.o. M) Primary Care Tatiana Courter: [name] Other Clinician: Referring Nashonda Limberg: Treating Belladonna Lubinski/Extender: [name] in Treatment: 48 Vital Signs Time Taken: 10:00 Temperature (F): 97.8 Height (in): 74 Pulse (bpm): 65 Weight (lbs): 238 Respiratory Rate (breaths/min): 20 Body Mass Index (BMI): 30.6 Blood Pressure (mmHg): 160/93 Capillary Blood Glucose (mg/dl): 180 Reference Range: 80 - 120 mg / dl Electronic Signature(s) Signed: 06/14/2022 11:58:36 AM By: Worthy Rancher Entered By: Worthy Rancher on 06/14/2022 10:01:54

## 2022-06-14 NOTE — Progress Notes (Signed)
CARLYN, MULLENBACH ([id number]) [id number] Page 1 of 19 Visit Report for 06/14/2022 Chief Complaint Document Details Patient Name: Date of Service: [name], [name] 06/14/2022 10:30 A M Medical Record Number: [id number] Patient Account Number: [id number] Date of Birth/Sex: Treating RN: 07/01/1950 (71 y.o. M) Primary Care Provider: [name] Other Clinician: Referring Provider: Treating Provider/Extender: [name] in Treatment: 61 Information Obtained from: Patient Chief Complaint Left leg and foot ulcers 04/12/2021; patient is here for wounds on his left lower leg and left plantar foot over the first metatarsal head Electronic Signature(s) Signed: 06/14/2022 10:49:45 AM By: Fredirick Maudlin MD FACS Entered By: Fredirick Maudlin on 06/14/2022 10:49:45 -------------------------------------------------------------------------------- Debridement Details Patient Name: Date of Service: [name] Garfinkel. 06/14/2022 10:30 A M Medical Record Number: [id number] Patient Account Number: [id number] Date of Birth/Sex: Treating RN: 06-Aug-1950 (71 y.o. [name] Primary Care Provider: [name] Other Clinician: Referring Provider: Treating Provider/Extender: [name] in Treatment: 61 Debridement Performed for Assessment: Wound #22 Left,Proximal,Lateral Lower Leg Performed By: Physician Fredirick Maudlin, MD Debridement Type: Debridement Level of Consciousness (Pre-procedure): Awake and Alert Pre-procedure Verification/Time Out Yes - 10:41 Taken: Start Time: 10:42 Pain Control: Lidocaine 4% T opical Solution T Area Debrided (L x W): otal 6 (cm) x 1.5 (cm) = 9 (cm) Tissue and other material debrided: Non-Viable, Slough, Slough Level: Non-Viable Tissue Debridement Description: Selective/Open Wound Instrument: Curette Bleeding: Minimum Hemostasis Achieved: Pressure Response to Treatment: Procedure was  tolerated well Level of Consciousness (Post- Awake and Alert procedure): Post Debridement Measurements of Total Wound Length: (cm) 6 Width: (cm) 1.5 Depth: (cm) 0.1 Volume: (cm) 0.707 Character of Wound/Ulcer Post Debridement: Improved Post Procedure Diagnosis Same as [name] ([id number]) [id number] Page 2 of 19 Notes Scribed for Dr. Celine Ahr by Blanche East, RN Electronic Signature(s) Signed: 06/14/2022 10:56:07 AM By: Fredirick Maudlin MD FACS Signed: 06/14/2022 4:49:39 PM By: Blanche East RN Entered By: Blanche East on 06/14/2022 10:44:53 -------------------------------------------------------------------------------- HPI Details Patient Name: Date of Service: [name] Garfinkel. 06/14/2022 10:30 A M Medical Record Number: [id number] Patient Account Number: [id number] Date of Birth/Sex: Treating RN: 07/23/1950 (71 y.o. M) Primary Care Provider: [name] Other Clinician: Referring Provider: Treating Provider/Extender: [name] in Treatment: 24 History of Present Illness HPI Description: 10/11/17; [name] is a 71 year old man who tells me that in 2015 he slipped down the latter traumatizing his left leg. He developed a wound in the same spot the area that we are currently looking at. He states this closed over for the most part although he always felt it was somewhat unstable. In 2016 he hit the same area with the door of his car had this reopened. He tells me that this is never really closed although sometimes an inflow it remains open on a constant basis. He has not been using any specific dressing to this except for topical antibiotics the nature of which were not really sure. His primary doctor did send him to see Dr. [name] of interventional cardiology. He underwent an angiogram on 08/06/17 and he underwent a PTA and directional atherectomy of the lesser distal SFA and popliteal arteries which resulted  in brisk improvement in blood flow. It was noted that he had 2 vessel runoff through the anterior tibial and peroneal. He is also been to see vascular and interventional radiologist. He was not felt to have any significant superficial venous insufficiency. Presumably is not a candidate for any ablation. It was suggested  he come here for wound care. The patient is a type II diabetic on insulin. He also has a history of venous insufficiency. ABIs on the left were noncompressible in our clinic 10/21/17; patient we admitted to the clinic last week. 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 We put Iodosorb on him after an aggressive debridement and 3 layer compression. He complained of pain in his ankle and itching with is skin in fact he scratched the area on the medial calf superiorly at the rim of our wraps and he has 2 small open areas in that location today which are new. I changed his primary dressing today to silver collagen. As noted he is already had revascularization and does not have any significant superficial venous insufficiency that would be amenable to ablation 10/28/17; patient admitted to the clinic 2 weeks ago. He has a smaller Wound. Scratch injury from last week revealed. There is large wound over the tibial area. This is smaller. Granulation looks healthy. No need for debridement. 11/04/17; the wound on the left lateral calf looks better. Improved dimensions. Surface of this looks better. We've been maintaining him and Kerlix Coban wraps. He finds this much more comfortable. Silver collagen dressing 11/11/17; left lateral Wound continues to look healthy be making progress. Using a #5 curet I removed removed nonviable skin from the surface of the wound and then necrotic debris from the wound surface. Surface of the wound continues to look healthy. He also has an open area on the left great toenail bed. We've been using  topical antibiotics. 11/19/17; left anterior lateral wound continues to look healthy but it's not closed. He also had a small wound above this on the left leg Initially traumatic wounds in the setting of significant chronic venous insufficiency and stasis dermatitis 11/25/17; left anterior wounds superiorly is closed still a small wound inferiorly. 12/02/17; left anterior tibial area. Arrives today with adherent callus. Post debridement clearly not completely closed. Hydrofera Blue under 3 layer compression. 12/09/17; left anterior tibia. Circumferential eschar however the wound bed looks stable to improved. We've been using Hydrofera Blue under 3 layer compression 12/17/17; left anterior tibia. Apparently this was felt to be closed however when the wrap was taken off there is a skin tear to reopen wounds in the same area we've been using Hydrofera Blue under 3 layer compression 12/23/17 left anterior tibia. Not close to close this week apparently the [facility] was stuck to this again. Still circumferential eschar requiring debridement. I put a contact layer on this this time under the Hydrofera Blue 12/31/17; left anterior tibia. Wound is better slight amount of hyper-granulation. Using Hydrofera Blue over Adaptic. 01/07/18; left anterior tibia. The wound had some surface eschar however after this was removed he has no open wound.he was already revascularized by Dr. [name] when he came to our clinic with atherectomy of the left SFA and popliteal artery. He was also sent to interventional radiology for venous reflux studies. He was not felt to have significant reflux but certainly has chronic venous changes of his skin with hemosiderin deposition around this area. He will definitely need to lubricate his skin and wear compression stocking and I've talked to him about this. READMISSION 05/26/2018 This is a now 71 year old man we cared for with traumatic wounds on his left anterior lower extremity. He had  been previously revascularized during that admission by Dr. [name]. Apparently in follow-up Dr. [name] noted that  he had deterioration in his arterial status. He underwent a stent placement in the distal left SFA on 04/22/2018. 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 He went back to the angiography suite on 04/30/2018 he underwent PTA and balloon angioplasty of the occluded left mid anterior tibial artery, thrombotic occlusion went from 100 to 0% which reconstitutes the posterior tibial artery. He had thrombectomy and aspiration of the peroneal artery. The stent placed in the distal SFA left SFA was still occluded. 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 He has been recuperating at [place] [facility] in [facility] after the most recent hospitalization. He is going home tomorrow. He tells me that 2 weeks ago he traumatized the tip of his left fifth toe. He came in urgently for our review of this. This was a history of before I noted that Dr. [name] had already noted dry gangrenous changes of the left fifth toe [name], [name] ([id number]) [id number] Page 3 of 19 06/09/2018; 2-week follow-up. I did contact Dr. [name] after his last appointment and he apparently saw 1 of Dr. Irven Shelling colleagues the next day. He does not follow-up with Dr. [name] himself until Thursday of this week. 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 Nevertheless there is no evidence of infection no drainage and no pain. He had a new area that this week when we were signing him in today on the left anterior mid tibia area, this is in close proximity to the previous wound we have dealt with in this clinic. 06/23/2018;  2-week follow-up. I did not receive a recent note from Dr. [name] to review today. Our office is trying to obtain this. He is apparently not planning to do further vascular interventions and wondered about compression to try and help with the patient's chronic venous insufficiency. However we are also concerned about the arterial flow. He arrives in clinic today with a new area on the left third toe. The areas on the calf/anterior tibia are close to closing. The left fifth toe is still mummified using Betadine. -In reviewing things with the patient he has what sounds like claudication with mild to moderate amount of activity. 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 I prescribed Levaquin over the phone while we attempted to arrange a plan of care. However the patient called yesterday to report he had low-grade fever and he came in today acutely. There is been a marked deterioration in the left third toe with spreading cellulitis up into the dorsal left foot. He was referred to the emergency room. Readmission: 06/29/2020 patient presents today for reevaluation here in our clinic he was previously treated by Dr. Dellia Nims at the latter part of 2019 in 2 the beginning of 2020. Subsequently we have not seen him since that time in the interim he did have evaluation with vein and vascular specialist specifically Dr. Anice Paganini who did perform quite extensive work for a left femoral to anterior tibial artery bypass. With that being said in the interim the patient has developed significant lymphedema and has wounds that he tells me have really never healed in regard to the incision site on the left leg. He also has  multiple wounds on the feet for various reasons some of which is that he tends to pick at his feet. Fortunately there is no signs of active infection systemically at this time he does have some wounds that are little bit deeper but most are fairly superficial he seems  to have good blood flow and overall everything appears to be healthy I see no bone exposed and no obvious signs of osteomyelitis. I do not know that he necessarily needs a x-ray at this point although that something we could consider depending on how things progress. The patient does have a history of lymphedema, diabetes, this is type II, chronic kidney disease stage III, hypertension, and history of peripheral vascular disease. 07/05/2020; patient admitted last week. Is a patient I remember from 2019 he had a spreading infection involving the left foot and we sent him to the hospital. 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 He subsequently had a left femoral to anterior tibial bypass by Dr.Cain vein and vascular. He also has severe lymphedema with chronic skin changes related to that on the left leg. The most problematic area that was new today was on the left medial great toe. This was apparently a small area last week there was purulent drainage which our intake nurse cultured. Also areas on the left medial foot and heel left lateral foot. He has 2 areas on the left medial calf left lateral calf in the setting of the severe lymphedema. 07/13/2020 on evaluation today patient appears to be doing better in my opinion compared to his last visit. The good news is there is no signs of active infection systemically and locally I do not see any signs of infection either. He did have an x-ray which was negative that is great news he had a culture which showed MRSA but at the same time he is been on the doxycycline which has helped. I do think we may want to extend this for 7 additional days 1/25; patient admitted to the clinic a few weeks ago. 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 We have been putting him under compression his edema control is a lot better but he is severe verricused skin on the left leg. He is really  done quite well he still has an open area on the left medial calf and the left medial first metatarsal head. We have been using silver collagen on the leg silver alginate on the foot 07/27/2020 upon evaluation today patient appears to be doing decently well in regard to his wounds. He still has a lot of dry skin on the left leg. Some of this is starting to peel back and I think he may be able to have them out by removing some that today. Fortunately there is no signs of active infection at this time on the left leg although on the right leg he does appear to have swelling and erythema as well as some mild warmth to touch. This does have been concerned about the possibility of cellulitis although within the differential diagnosis I do think that potentially a DVT has to be at least considered. We need to rule that out before proceeding would just call in the cellulitis. Especially since he is having pain in the posterior aspect of his calf muscle. 2/8; the patient had seen sparingly. He has severe skin changes of chronic lymphedema in the left leg thickened hyperkeratotic verrucous skin.  He has an open wound on the medial part of the left first met head left mid tibia. He also has a rim of nonepithelialized skin in the anterior mid tibia. He brought in the AmLactin lotion that was been prescribed although I am not sure under compression and its utility. There concern about cellulitis on the right lower leg the last time he was here. He was put on on antibiotics. His DVT rule out was negative. The right leg looks fine he is using his stocking on this area 08/10/2020 upon evaluation today patient appears to be doing well with regard to his leg currently. He has been tolerating the dressing changes without complication. Fortunately there is no signs of active infection which is great news. Overall very pleased with where things stand. 2/22; the patient still has an area on the medial part of the left first  met his head. This looks better than when I last saw this earlier this month he has a rim of epithelialization but still some surface debris. Mostly everything on the left leg is healed. There is still a vulnerable in the left mid tibia area. 08/30/2020 upon evaluation today patient appears to be doing much better in regard to his wounds on his foot. 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 Nonetheless I think we will address that today I Minna send in a prescription for him in that regard. Overall though there does not appear to be any signs of significant worsening. 09/07/2020 on evaluation today patient's wounds over his left foot appear to be doing excellent. I do not see any signs of infection there is some callus buildup this can require debridement for certain but overall I feel like he is managing quite nicely. He still using the AmLactin cream which has been beneficial for him as well. 3/22; left foot wound is closed. There is no open area here. He is using ammonium lactate lotion to the lower extremities to help exfoliate dry cracked skin. He has compression stockings from elastic therapy in [place]. The wound on the medial part of his left first met head is healed today. READMISSION 04/12/2021 [name] is a patient we know fairly well he had a prolonged stay in clinic in 2019 with wounds on his left lateral and left anterior lower extremity in the setting of chronic venous insufficiency. More recently he was here earlier this year with predominantly an area on his left foot first metatarsal head plantar and he says the plantar foot broke down on its not long after we discharged him but he did not come back here. The last few months areas of broken down on his left anterior and again the left lateral lower extremity. The leg itself is very swollen  chronically enlarged a lot of hyperkeratotic dry Berry Q skin in the left lower leg. His edema extends well into the thigh. He was seen by Dr. Donzetta Matters. He had ABIs on 03/02/2021 showing an ABI on the right of 1 with a TBI of 0.72 his ABI in the left at 1.09 TBI of 0.99. Monophasic and biphasic waveforms on the right. On the left monophasic waveforms were noted he went on to have an angiogram on 03/27/2021 this showed the aortic aortic and iliac segments were free of flow-limiting stenosis the left common femoral vein to evaluate the left femoral to anterior tibial  artery bypass was unobstructed the bypass was patent without any areas of stenosis. We discharged the patient in bilateral juxta lite stockings but very clearly that was not sufficient to control the swelling and maintain skin integrity. He is clearly going to need compression pumps. 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 This is fortunate because he is on his feet for long periods of time. 10/27; patient comes in with our intake nurse reporting copious amount of green drainage from the left anterior mid tibia the left dorsal foot and to a lesser extent the left medial mid tibia. We left the compression wrap on all week for the amount of edema in his left leg is quite a bit better. We use silver alginate as the primary dressing 11/3; edema control is good. Left anterior lower leg left medial lower leg and the plantar first metatarsal head. The left anterior lower leg required debridement. Deep tissue culture I did of this wound showed MRSA I put him on 10 days of doxycycline which she will start today. We have him in compression wraps. He [name], [name] ([id number]) [id number] Page 4 of 19 has a security card and [employer] however he is retiring on November 15. We will need to then get him into a better offloading boot for the left foot perhaps a total contact  cast 11/10; edema control is quite good. Left anterior and left medial lower leg wounds in the setting of chronic venous insufficiency and lymphedema. He also has a substantial area over the left plantar first metatarsal head. 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 He has significant hypergranulation on the left plantar foot wound. The patient is a diabetic but he does not have significant PAD 11/17; edema control is quite good. Left anterior and left medial lower leg wounds look better. The really concerning area remains the area on the left plantar first metatarsal head. He has a rim of epithelialization. 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 Starting today with a forefoot off loader but . possibly a total contact cast. 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 Arrives with a very significant odor from I think all of the wounds on the left leg and the left foot. Because of this I did not put a total contact cast on him today but will could still consider this. His wife was having cataract surgery which is the reason he missed the appointment 12/6. I saw this man 5 days ago with a swelling below the popliteal fossa. I thought he actually might have a Baker's cyst however the DVT rule out study that we could arrange right away was negative the technician told me this was not a ruptured Baker's cyst. We attempted to get this aspirated by under ultrasound guidance in interventional radiology however all they did was an ultrasound however it shows an extensive fluid collection 62 x 8 x 9.4 in the left thigh and left calf. The patient states he thinks this started 8 days ago or so but  he really is not complaining of  any pain, fever or systemic symptoms. He has not ha 12/20; after some difficulty I managed to get the patient into see Dr. Donzetta Matters. Eventually he was taken into the hospital and had a drain put in the fluid collection below his left knee posteriorly extending into the posterior thigh. He still has the drain in place. Culture of this showed moderate staff aureus few Morganella and few Klebsiella he is now on doxycycline and ciprofloxacin as suggested by infectious disease he is on this for a month. The drain will remain in place until it stops draining 12/29; he comes in today with the 1 wound on his left leg and the area on the left plantar first met head significantly smaller. Both look healthy. He still has the drain in the left leg. He says he has to change this daily. Follows up with Dr. Donzetta Matters on January 11. 06/29/2021; the wounds that I am following on the left leg and left first met head continued to be quite healthy. However the area where his inferior drain is in place had copious amounts of drainage which was green in color. The wound here is larger. Follows up with Dr. Gwenlyn Saran of vein and vascular his surgeon next week as well as infectious disease. He remains on ciprofloxacin and doxycycline. He is not complaining of excessive pain in either one of the drain areas 1/12; the patient saw vascular surgery and infectious disease. Vascular surgery has left the drain in place as there was still some notable drainage still see him back in 2 weeks. Dr. Velna Ochs stop the doxycycline and ciprofloxacin and I do not believe he follows up with them at this point. Culture I did last week showed both doxycycline resistant MRSA and Pseudomonas not sensitive to ciprofloxacin although only in rare titers 1/19; the patient's wound on the left anterior lower leg is just about healed. We have continued healing of the area that was medially on the left leg. Left first plantar metatarsal head  continues to get smaller. The major problem here is his 2 drain sites 1 on the left upper calf and lateral thigh. There is purulent drainage still from the left lateral thigh. I gave him antibiotics last week but we still have recultured. He has the drain in the area I think this is eventually going to have to come out. I suspect there will be a connecting wound to heal here perhaps with improved VAc 1/26; the patient had his drain removed by vein and vascular on 1/25/. This was a large pocket of fluid in his left thigh that seem to tunnel into his left upper calf. 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 His mention his Penrose drain was removed today. He now has a tunneling wound on his left calf and left thigh. Both of these probe widely towards each other although I cannot really prove that they connect. Both wounds on his lower leg anteriorly are closed and his area over the first metatarsal head on his right foot continues to improve. We are using Hydrofera Blue here. He also saw infectious disease culture of the abscess they noted was polymicrobial with MRSA, Morganella and Klebsiella he was treated with doxycycline and ciprofloxacin for 4 weeks ending on 07/03/2021. They did not recommend any further antibiotics. Notable that while he still had the Penrose drain in place last week he had purulent drainage coming out of the inferior IandD site this grew [place] [facility], MRSA and Pseudomonas but there does  not appear to be any active infection in this area today with the drain out and he is not systemically unwell 2/2; with regards to the drain sites the superior one on the thigh actually is closed down the one on the upper left lateral calf measures about 8 and half centimeters which is an improvement seems to be less prominent although still with a lot of drainage. The only remaining wound is over the first metatarsal head on the left foot and this looks to be continuing to  improve with Hydrofera Blue. 2/9; the area on his plantar left foot continues to contract. Callus around the wound edge. The drain sites specifically have not come down in depth. We put the wound VAC on Monday he changed the canister late last night our intake nurse reported a pocket of fluid perhaps caused by our compression wraps 2/16; continued improvement in left foot plantar wound. drainage site in the calf is not improved in terms of depth (wound vac) 2/23; continued improvement in the left foot wound over the first metatarsal head. With regards to the drain sites the area on his thigh laterally is healed however the open area on his calf is small in terms of circumference by still probes in by about 15 cm. Within using the wound VAC. Hydrofera Blue on his foot 08/24/2021: The left first metatarsal head wound continues to improve. The wound bed is healthy with just some surrounding callus. Unfortunately the open drain site on his calf remains open and tunnels at least 15 cm (the extent of a Q-tip). This is despite several weeks of wound VAC treatment. Based on reading back through the notes, there has been really no significant change in the depth of the wound, although the orifice is smaller and the more cranial wound on his thigh has closed. I suspect the tunnel tracks nearly all the way to this location. 08/31/2021: Continued improvement in the left first metatarsal head wound. There has been absolutely no improvement to the long tunnel from his open drain site on his calf. We have tried to get him into see vascular surgery sooner to consider the possibility of simply filleting the tract open and allowing it to heal from the bottom up, likely with a wound VAC. They have not yet scheduled a sooner appointment than his current mid April 09/14/2021: He was seen by vascular surgery and they took him to the operating room last week. They opened a portion of the tunnel, but did not extend the entire  length of the known open subcutaneous tract. I read Dr. Claretha Cooper operative note and it is not clear from that documentation why only a portion of the tract was opened. The heaped up granulation tissue was curetted and removed from at least some portion of the tract. They did place a wound VAC and applied an Unna boot to the leg. The ulcer on his left first metatarsal head is smaller today. The bed looks good and there is just a small amount of surrounding callus. 09/21/2021: The ulcer on his left first metatarsal head looks to be stalled. There is some callus surrounding the wound but the wound bed itself does not appear particularly dynamic. The tunnel tract on his lateral left leg seems to be roughly the same length or perhaps slightly smaller but the wound bed appears healthy with good granulation tissue. He opened up a new wound on his medial thigh and the site of a prior surgical incision. He says that he did  this unconsciously in his sleep by scratching. 09/28/2021: Unfortunately, the ulcer on his left first metatarsal head has extended underneath the callus toward the dorsum of his foot. The medial thigh wounds are roughly the same. The tunnel on his lateral left leg continues to be problematic; it is longer than we are able to actually probe with a Q-tip. I am still not certain as to why Dr. Donzetta Matters did not open this up entirely when he took the patient to the operating room. We will likely be back in the same situation with just a small superficial opening in a long unhealed tract, as the open portion is granulating in nicely. [name], [name] ([id number]) [id number] Page 5 of 19 10/02/2021: The patient was initially scheduled for a nurse visit, but we are also applying a total contact cast today. The plantar foot wound looks clean without significant accumulated callus. We have been applying Prisma silver collagen to the site. 10/05/2021: The patient is here for his first  total contact cast change. We have tried using gauze packing strips in the tunnel on his lateral leg wound, but this does not seem to be working any better than the white VAC foam. The foot ulcer looks about the same with minimal periwound callus. Medial thigh wound is clean with just some overlying eschar. 10/12/2021: The plantar foot wound is stable without any significant accumulation of periwound callus. The surface is viable with good granulation tissue. The medial thigh wounds are much smaller and are epithelializing. On the other hand, he had purulent drainage coming from the tunnel on his lateral leg. He does go back to see Dr. Donzetta Matters next week and is planning to ask him why the wound tunnel was not completely opened at the time of his most recent operation. 10/19/2021: The plantar foot wound is markedly improved and has epithelial tissue coming through the surface. The medial thigh wounds are nearly closed with just a tiny open area. He did see Dr. Donzetta Matters earlier this week and apparently they did discuss the possibility of opening the sinus tract further and enabling a wound VAC application. Apparently there are some limits as to what Dr. 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 I think if we could get the tract open to the level of the popliteal fossa, this would greatly aid in her ability to get this chart closed. That being said, however, today when I probed the tract with a Q-tip, I was not able to insert the entirety of the Q-tip as I have on previous occasions. The tunnel is shorter by about 4 cm. The surface is clean with good granulation tissue and no further episodes of purulent drainage. 10/30/2021: Last week, the patient underwent surgery and had the long tract in his leg opened. There was a rind that was debrided, according to the operative report. His medial thigh ulcers are closed. The plantar foot wound is clean with a good surface and some built up  surrounding callus. 11/06/2021: The overall dimensions of the large wound on his lateral leg remain about the same, but there is good granulation tissue present and the tunneling is a little bit shorter. He has a new wound on his anterior tibial surface, in the same location where he had a similar lesion in the past. The plantar foot wound is clean with some buildup surrounding callus. Just toward the medial aspect of his foot, however, there is an area of darkening that once debrided, revealed another opening  in the skin surface. 11/13/2021: The anterior tibial surface wound is closed. The plantar foot wound has some surrounding callus buildup. The area of darkening that I debrided last week and revealed an opening in the skin surface has closed again. The tunnel in the large wound on his lateral leg has come in by about 3 cm. There is healthy granulation tissue on the entire wound surface. 11/23/2021: The patient was out of town last week and did wet-to-dry dressings on his large wound. 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 Unfortunately, he picked open the wound on his left medial thigh. He says that it was itching and he just could not stop scratching it until it was open again. The wound on his plantar foot is smaller and has not accumulated a tremendous amount of callus. The lateral leg wound is shallower and the tunnel has also decreased in depth. There is just a little bit of slough accumulation on the surface. 11/30/2021: Another portion of his left medial thigh has been opened up. All of these wounds are fairly superficial with just a little bit of slough and eschar accumulation. The wound on his plantar foot is almost closed with just a bit of eschar and periwound callus accumulation. The lateral leg wound is nearly flush with the surrounding skin and the tunnel is markedly shallower. 12/07/2021: There is just 1 open area on  his left medial thigh. It is clean with just a little bit of perimeter eschar. The wound on his plantar foot continues to contract and just has some eschar and periwound callus accumulation. The lateral leg wound is closing at the more distal aspect and the tunnel is smaller. The surface is nearly flush with the surrounding skin and it has a good bed of granulation tissue. 12/14/2021: The thigh and foot wounds are closed. The lateral leg wound has closed over approximately half of its length. The tunnel continues to contract and the surface is now flush with the surrounding skin. The wound bed has robust granulation tissue. 12/22/2021: The thigh and foot wounds have reopened. The foot wound has a lot of callus accumulation around and over it. The thigh wound is tiny with just a little bit of slough in the wound bed. The lateral leg wound continues to contract. His vascular surgeon took the wound VAC off earlier in the week and the patient has been doing wet-to-dry dressings. There is a little slough accumulation on the surface. The tunnel is about 3 cm in depth at this point. 12/28/2021: The thigh wound is closed again. The foot wound has some callus that subsequently has peeled back exposing just a small slit of a wound. The lateral leg wound Is down to about half the size that it originally was and the tunnel is down to about half a centimeter in depth. 01/04/2022: The thigh wound remains closed. The foot wound has heavy callus overlying the wound site. Once this was debrided, the wound was found to be closed. The lateral leg wound is smaller again this week and very superficial. No tunnel could be identified. 01/12/2022: The thigh and foot wounds both remain closed. The lateral leg wound is now nearly flush with the skin surface. There is good granulation tissue present with a light layer of slough. 01/19/2022: Due to the way his wrap was placed, the patient did not change the dressing on his thigh at all  and so the foam  was saturated and his skin is macerated. There is a light layer of slough on the wound surface. The underlying granulation tissue is robust and healthy-appearing. He has heavy callus buildup at the site of his first metatarsal head wound which is still healed. 02/01/2022: He has been in silver alginate. When he removed the dressing from his thigh wound, however, some leg, superficially reopening a portion of the wound that had healed. In addition, underneath the callus at his left first metatarsal head, there appears to be a blister and the wound appears to be open again. 02/08/2022: The lateral leg wound has contracted substantially. There is eschar and a light layer of slough present. He says that it is starting to pull and is uncomfortable. On inspection, there is some puckering of the scar and the eschar is quite dry; this may account for his symptoms. On his first metatarsal head, the wound is much smaller with just some eschar on the surface. The callus has not reaccumulated. He reports that he had a blister come up on his medial thigh wound at the distal aspect. It popped and there is now an opening in his skin again. Looking back through his [facility] of wound photos, there is what looks like a permanent suture just deep to this location and it may be trying to erode through. We have been using silver alginate on his wounds. 02/15/2022: The lateral leg wound is about half the size it was last week. It is clean with just a little perimeter eschar and light slough. The wound on his first metatarsal head is about the same with heavy callus overlying it. The medial thigh wound is closed again. He does have some skin changes on the top of his foot that looks potentially yeast related. 02/22/2022: The skin on the top of his foot improved with the use of a topical antifungal. The lateral leg wound continues to contract and is again smaller this week. There is a little bit of slough and  eschar on the surface. The first metatarsal head wound is a little bit smaller but has reaccumulated a thick callus over the top. He decided to try to trim his toenail and ultimately took the entire nail off of his left great toe. 03/02/2022: His lateral leg wound continues to improve, as does the wound on his left great toe. Unfortunately, it appears that somehow his foot got wet and moisture seeped in through the opening causing his skin to lift. There is a large wound now overlying his first metatarsal on both the plantar, medial, and dorsal portion of his foot. There is necrotic tissue and slough present underneath the shaggy macerated skin. 03/08/2022: The lateral leg wound is smaller again today. There is just a light layer of slough and eschar on the surface. The great toe wound is smaller again today. The first metatarsal wound is a little bit smaller today and does not look nearly as necrotic and macerated. There is still slough and nonviable tissue present. 03/15/2022: The lateral leg wound is narrower and just has a little bit of light slough buildup. The first metatarsal wound still has a fair amount of moisture affecting the periwound skin. The great toe wound is healed. 03/22/2022: The lateral leg wound is now isolated to just at the level of his knee. There is some eschar and slough accumulation. The first metatarsal head [name], [name] ([id number]) [phone].pdf Page 6 of 19 wound has epithelialized tremendously and is about half the size that it  was last week. He still has some maceration on the top of his foot and a fungal odor is present. 03/29/2022: T oday the patient's foot was macerated, suggesting that the cast got wet. The patient has also been picking at his dry skin and has enlarged the wound on his left lateral leg. In the time between having his cast removed and my evaluation, he had picked more dry skin and opened up additional wounds on his Achilles  area and dorsal foot. The plantar first metatarsal head wound, however, is smaller and clean with just macerated callus around the perimeter and light slough on the surface. The lateral leg wound measured a little bit larger but is also fairly clean with eschar and minimal slough. 04/02/2022: The patient had vascular studies done last Friday and so his cast was not applied. He is here today to have that done. 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 04/05/2022: Both wounds are smaller and quite clean. There is just a little biofilm on the lateral leg wound. 10/20; the patient has a wound on the left lateral surgical incision at the level of his lateral knee this looks clean and improved. He is using silver alginate. He also has an area on his left medial foot for which she is using Hydrofera Blue under a total contact cast both wounds are measuring smaller 04/20/2022: The plantar foot wound has contracted considerably and is very close to closing. The lateral leg wound was measured a little larger, but there was a tiny open area that was included in the measurements that was not included last week. He has some eschar around the perimeter but otherwise the wound looks clean. 04/27/2022: The lateral leg wound looks better this week. He says that midweek, he felt it was very dry and began applying hydrogel to the site. I think this was beneficial. The foot wound is nearly closed underneath a thick layer of dry skin and callus. 05/04/2022: The foot wound is healed. He has developed a new small ulcer on his anterior tibial surface about midway up his leg. It has a little slough on the surface. The lateral leg wound still is fairly dry, but clean with just a little biofilm on the surface. 05/11/2022: The wound on his foot reopened on Wednesday. A large blister formed which then broke open revealing the fat layer underneath. The ulcer on his anterior tibial surface is a little bit larger this  week. The lateral leg wound has much better moisture balance this week. Fortunately, prior to his foot wound reopening, he did get the cast made for his orthotic. 05/15/2022: Already, the left medial foot wound has improved. The tissue is less macerated and the surface is clean. The ulcer on his anterior tibial surface continues to enlarge. This seems likely secondary to accumulated moisture. The lateral leg wound continues to have an improved moisture balance with the use of collagen. 05/25/2022: The medial foot wound continues to contract. It is now substantially smaller with just a little slough on the surface. The anterior tibial surface wound continues to enlarge further. Once again, this seems to be secondary to moisture. The lateral leg wound does not seem to be changing much in size, but the moisture balance is better. 06/01/2022: The anterior tibial wound is closed. The medial foot wound is down to just a very small, couple of millimeters, opening. The lateral leg wound has good moisture balance, but remains unchanged in size. 12/15; the patient's anterior tibial wound  has reopened, however the area on his right first metatarsal head is closed. The major wound is actually on the superior part of his surgical wound in the left lateral thigh. Not a completely viable surface under illumination. This may at some point require a debridement I think he is currently using Prisma. As noted the left medial foot wound has closed 06/14/2022: The anterior tibial wound has closed. The lateral leg wound has a better surface but is basically unchanged in size. The left medial foot wound has reopened. It looks as though there was some callus accumulation and moisture got under the callus which caused the tissue to break down again. Electronic Signature(s) Signed: 06/14/2022 10:51:27 AM By: Fredirick Maudlin MD FACS Entered By: Fredirick Maudlin on 06/14/2022  10:51:27 -------------------------------------------------------------------------------- Physical Exam Details Patient Name: Date of Service: [name] Garfinkel. 06/14/2022 10:30 A M Medical Record Number: [id number] Patient Account Number: [id number] Date of Birth/Sex: Treating RN: Nov 22, 1950 (71 y.o. M) Primary Care Provider: [name] Other Clinician: Referring Provider: Treating Provider/Extender: [name] in Treatment: 42 Constitutional He is hypertensive, but asymptomatic.. . . . No acute distress. Respiratory Normal work of breathing on room air. Notes 06/14/2022: The anterior tibial wound has closed. The lateral leg wound has a better surface but is basically unchanged in size. The left medial foot wound has reopened. It looks as though there was some callus accumulation and moisture got under the callus which caused the tissue to break down again. Electronic Signature(s) Signed: 06/14/2022 10:52:15 AM By: Fredirick Maudlin MD Caroll Rancher ([id number]) [id number] Page 7 of 19 Entered By: Fredirick Maudlin on 06/14/2022 10:52:14 -------------------------------------------------------------------------------- Physician Orders Details Patient Name: Date of Service: JALANI, ROMINGER 06/14/2022 10:30 A M Medical Record Number: [id number] Patient Account Number: [id number] Date of Birth/Sex: Treating RN: 1950-07-23 (71 y.o. [name] Primary Care Provider: [name] Other Clinician: Referring Provider: Treating Provider/Extender: [name] in Treatment: 19 Verbal / Phone Orders: No Diagnosis Coding ICD-10 Coding Code Description E11.621 Type 2 diabetes mellitus with foot ulcer 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.828 Non-pressure chronic  ulcer of other part of left lower leg with other specified severity I89.0 Lymphedema, not elsewhere classified I87.322 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 Follow-up Appointments ppointment in 1 week. - Dr Celine Ahr - Room 1 Return A Other: - [facility] next week 06/13/22 Anesthetic Wound #22 Left,Proximal,Lateral Lower Leg (In clinic) Topical Lidocaine 4% applied to wound bed Bathing/ Shower/ Hygiene May shower with protection but do not get wound dressing(s) wet. - Use a cast protector so you can shower without getting your cast wet Edema Control - Lymphedema / SCD / Other Elevate legs to the level of the heart or above for 30 minutes daily and/or when sitting, a frequency of: - throughout the day Avoid standing for long periods of time. Patient to wear own compression stockings every day. - on right leg; Moisturize legs daily. Compression stocking or Garment 20-30 mm/Hg pressure to: - left leg daily Off-Loading Total Contact Cast to Left Lower Extremity Other: - minimal weight bearing left foot Non Wound Condition Other Extremity - LEFT MEDIAL FOOT Other Non Wound Condition Orders/Instructions: - Place Optifoam on this Left medial foot before 3 layered compression Wound Treatment Wound #18 - Metatarsal head first Wound Laterality: Plantar, Left Cleanser: Soap  and Water 3 x Per Week/30 Days Discharge Instructions: May shower and wash wound with dial antibacterial soap and water prior to dressing change. Cleanser: Wound Cleanser 3 x Per Week/30 Days Discharge Instructions: Cleanse the wound with wound cleanser prior to applying a clean dressing using gauze sponges, not tissue or cotton balls. Peri-Wound Care: Sween Lotion (Moisturizing lotion) 3 x Per Week/30 Days Discharge Instructions: Apply moisturizing lotion to the leg Topical: Skintegrity Hydrogel 4 (oz) 3 x Per Week/30 Days Discharge Instructions: Apply hydrogel as  directed Prim Dressing: Sorbalgon AG Dressing, 4x4 (in/in) 3 x Per Week/30 Days ary Discharge Instructions: Apply to wound bed as instructed Secondary Dressing: ABD Pad, 8x10 (Generic) 3 x Per Week/30 Days Discharge Instructions: Apply over primary dressing as directed. TORION, HULGAN ([id number]) [id number] Page 8 of 19 Secured With: Elastic Bandage 4 inch (ACE bandage) 3 x Per Week/30 Days Discharge Instructions: Secure with ACE bandage as directed. Wound #22 - Lower Leg Wound Laterality: Left, Lateral, Proximal Cleanser: Soap and Water 3 x Per Week/30 Days Discharge Instructions: May shower and wash wound with dial antibacterial soap and water prior to dressing change. Cleanser: Wound Cleanser 3 x Per Week/30 Days Discharge Instructions: Cleanse the wound with wound cleanser prior to applying a clean dressing using gauze sponges, not tissue or cotton balls. Peri-Wound Care: Sween Lotion (Moisturizing lotion) 3 x Per Week/30 Days Discharge Instructions: Apply moisturizing lotion to the leg Topical: Skintegrity Hydrogel 4 (oz) 3 x Per Week/30 Days Discharge Instructions: Apply hydrogel as directed Prim Dressing: Endoform 2x2 in 3 x Per Week/30 Days ary Discharge Instructions: Moisten with Hydrogel or saline Secondary Dressing: ABD Pad, 8x10 (Generic) 3 x Per Week/30 Days Discharge Instructions: Apply over primary dressing as directed. Secured With: Elastic Bandage 4 inch (ACE bandage) 3 x Per Week/30 Days Discharge Instructions: Secure with ACE bandage as directed. Electronic Signature(s) Signed: 06/14/2022 10:56:07 AM By: Fredirick Maudlin MD FACS Entered By: Fredirick Maudlin on 06/14/2022 10:53:59 -------------------------------------------------------------------------------- Problem List Details Patient Name: Date of Service: [name] Garfinkel. 06/14/2022 10:30 A M Medical Record Number: [id number] Patient Account Number: [id number] Date of Birth/Sex:  Treating RN: [birth year] (71 y.o. M) Primary Care Provider: [name] Other Clinician: Referring Provider: Treating Provider/Extender: [name] in Treatment: 40 Active Problems ICD-10 Encounter Code Description Active Date MDM Diagnosis E11.621 Type 2 diabetes mellitus with foot ulcer 04/12/2021 No Yes 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 04/12/2021 No Yes L97.528 Non-pressure chronic ulcer of other part of left foot with other specified 04/12/2021 No Yes severity L97.828 Non-pressure chronic ulcer of other part of left lower leg with other specified 04/12/2021 No Yes severity I89.0 Lymphedema, not elsewhere classified 04/12/2021 No Yes BRENTLY, VOORHIS ([id number]) [phone].pdf Page 9 of 19 I87.322 Chronic venous hypertension (idiopathic) with inflammation of left lower 04/12/2021 No Yes extremity Inactive Problems ICD-10 Code Description Active Date Inactive Date E11.42 Type 2 diabetes mellitus with diabetic polyneuropathy 04/12/2021 04/12/2021 L02.416 Cutaneous abscess of left lower limb 06/13/2021 06/13/2021 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 07/20/2021 07/20/2021 I33.825 Non-pressure chronic ulcer of other part of left lower leg with other specified severity 06/08/2022 06/08/2022 Resolved Problems Electronic Signature(s) Signed: 06/14/2022 10:49:23 AM By: Fredirick Maudlin MD FACS Entered By: Fredirick Maudlin on 06/14/2022 10:49:23 -------------------------------------------------------------------------------- Progress Note Details Patient Name: Date of Service: [name] Garfinkel. 06/14/2022 10:30 A M Medical Record Number: [id number] Patient Account Number: [id number] Date of Birth/Sex: Treating RN:  1950-08-23 (71 y.o. M) Primary Care Provider: [name] Other Clinician: Referring Provider: Treating Provider/Extender: [name] in Treatment: 35 Subjective Chief Complaint Information obtained from Patient Left leg and foot ulcers 04/12/2021; patient is here for wounds on his left lower leg and left plantar foot over the first metatarsal head History of Present Illness (HPI) 10/11/17; [name] is a 71 year old man who tells me that in 2015 he slipped down the latter traumatizing his left leg. He developed a wound in the same spot the area that we are currently looking at. He states this closed over for the most part although he always felt it was somewhat unstable. In 2016 he hit the same area with the door of his car had this reopened. He tells me that this is never really closed although sometimes an inflow it remains open on a constant basis. He has not been using any specific dressing to this except for topical antibiotics the nature of which were not really sure. His primary doctor did send him to see Dr. [name] of interventional cardiology. He underwent an angiogram on 08/06/17 and he underwent a PTA and directional atherectomy of the lesser distal SFA and popliteal arteries which resulted in brisk improvement in blood flow. It was noted that he had 2 vessel runoff through the anterior tibial and peroneal. He is also been to see vascular and interventional radiologist. He was not felt to have any significant superficial venous insufficiency. Presumably is not a candidate for any ablation. It was suggested he come here for wound care. The patient is a type II diabetic on insulin. He also has a history of venous insufficiency. ABIs on the left were noncompressible in our clinic 10/21/17; patient we admitted to the clinic last week. 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 We put Iodosorb on him after an aggressive debridement and 3 layer compression. He complained of pain in his ankle and itching with is skin in fact he  scratched the area on the medial calf superiorly at the rim of our wraps and he has 2 small open areas in that location today which are new. I changed his primary dressing today to silver collagen. As noted he is already had revascularization and does not have any significant superficial venous insufficiency that would be amenable to ablation 10/28/17; patient admitted to the clinic 2 weeks ago. He has a smaller Wound. Scratch injury from last week revealed. There is large wound over the tibial area. This is smaller. Granulation looks healthy. No need for debridement. 11/04/17; the wound on the left lateral calf looks better. Improved dimensions. Surface of this looks better. We've been maintaining him and Kerlix Coban wraps. He finds this much more comfortable. Silver collagen dressing 11/11/17; left lateral Wound continues to look healthy be making progress. Using a #5 curet I removed removed nonviable skin from the surface of the wound SKYLIER, KRETSCHMER ([id number]) [phone].pdf Page 10 of 19 and then necrotic debris from the wound surface. Surface of the wound continues to look healthy. ooHe also has an open area on the left great toenail bed. We've been using topical antibiotics. 11/19/17; left anterior lateral wound continues to look healthy but it's not closed. ooHe also had a small wound above this on the left leg ooInitially traumatic wounds in the setting of significant chronic venous insufficiency and stasis dermatitis 11/25/17; left anterior wounds superiorly is closed still a  small wound inferiorly. 12/02/17; left anterior tibial area. Arrives today with adherent callus. Post debridement clearly not completely closed. Hydrofera Blue under 3 layer compression. 12/09/17; left anterior tibia. Circumferential eschar however the wound bed looks stable to improved. We've been using Hydrofera Blue under 3 layer compression 12/17/17; left anterior tibia. Apparently this was  felt to be closed however when the wrap was taken off there is a skin tear to reopen wounds in the same area we've been using Hydrofera Blue under 3 layer compression 12/23/17 left anterior tibia. Not close to close this week apparently the [facility] was stuck to this again. Still circumferential eschar requiring debridement. I put a contact layer on this this time under the Hydrofera Blue 12/31/17; left anterior tibia. Wound is better slight amount of hyper-granulation. Using Hydrofera Blue over Adaptic. 01/07/18; left anterior tibia. The wound had some surface eschar however after this was removed he has no open wound.he was already revascularized by Dr. [name] when he came to our clinic with atherectomy of the left SFA and popliteal artery. He was also sent to interventional radiology for venous reflux studies. He was not felt to have significant reflux but certainly has chronic venous changes of his skin with hemosiderin deposition around this area. He will definitely need to lubricate his skin and wear compression stocking and I've talked to him about this. READMISSION 05/26/2018 This is a now 71 year old man we cared for with traumatic wounds on his left anterior lower extremity. He had been previously revascularized during that admission by Dr. [name]. Apparently in follow-up Dr. [name] noted that he had deterioration in his arterial status. He underwent a stent placement in the distal left SFA on 04/22/2018. 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 He went back to the angiography suite on 04/30/2018 he underwent PTA and balloon angioplasty of the occluded left mid anterior tibial artery, thrombotic occlusion went from 100 to 0% which reconstitutes the posterior tibial artery. He had thrombectomy and aspiration of the peroneal artery. The stent placed in the distal SFA left SFA was still occluded. He was discharged on Xarelto, it was noted on the discharge summary from this  hospitalization that he had gangrene at the tip of his left fifth toe and there were expectations this would auto amputate. 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 He has been [facility] after the most recent hospitalization. He is going home tomorrow. He tells me that 2 weeks ago he traumatized the tip of his left fifth toe. He came in urgently for our review of this. This was a history of before I noted that Dr. [name] had already noted dry gangrenous changes of the left fifth toe 06/09/2018; 2-week follow-up. I did contact Dr. [name] after his last appointment and he apparently saw 1 of Dr. Irven Shelling colleagues the next day. He does not follow-up with Dr. [name] himself until Thursday of this week. 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 Nevertheless there is no evidence of infection no drainage and no pain. He had a new area that this week when we were signing him in today on the left anterior mid tibia area, this is in close proximity to the previous wound we have dealt with in this clinic. 06/23/2018; 2-week follow-up. I did not receive a recent note from Dr. [name] to review today. Our office is trying to obtain  this. He is apparently not planning to do further vascular interventions and wondered about compression to try and help with the patient's chronic venous insufficiency. However we are also concerned about the arterial flow. ooHe arrives in clinic today with a new area on the left third toe. The areas on the calf/anterior tibia are close to closing. The left fifth toe is still mummified using Betadine. -In reviewing things with the patient he has what sounds like claudication with mild to moderate amount of activity. 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 I prescribed Levaquin over the phone while we attempted to arrange a plan of  care. However the patient called yesterday to report he had low-grade fever and he came in today acutely. There is been a marked deterioration in the left third toe with spreading cellulitis up into the dorsal left foot. He was referred to the emergency room. Readmission: 06/29/2020 patient presents today for reevaluation here in our clinic he was previously treated by Dr. Dellia Nims at the latter part of 2019 in 2 the beginning of 2020. Subsequently we have not seen him since that time in the interim he did have evaluation with vein and vascular specialist specifically Dr. Anice Paganini who did perform quite extensive work for a left femoral to anterior tibial artery bypass. With that being said in the interim the patient has developed significant lymphedema and has wounds that he tells me have really never healed in regard to the incision site on the left leg. He also has multiple wounds on the feet for various reasons some of which is that he tends to pick at his feet. Fortunately there is no signs of active infection systemically at this time he does have some wounds that are little bit deeper but most are fairly superficial he seems to have good blood flow and overall everything appears to be healthy I see no bone exposed and no obvious signs of osteomyelitis. I do not know that he necessarily needs a x-ray at this point although that something we could consider depending on how things progress. The patient does have a history of lymphedema, diabetes, this is type II, chronic kidney disease stage III, hypertension, and history of peripheral vascular disease. 07/05/2020; patient admitted last week. Is a patient I remember from 2019 he had a spreading infection involving the left foot and we sent him to the hospital. 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 He subsequently had a left femoral to anterior tibial bypass by Dr.Cain vein and vascular. He also has severe  lymphedema with chronic skin changes related to that on the left leg. The most problematic area that was new today was on the left medial great toe. This was apparently a small area last week there was purulent drainage which our intake nurse cultured. Also areas on the left medial foot and heel left lateral foot. He has 2 areas on the left medial calf left lateral calf in the setting of the severe lymphedema. 07/13/2020 on evaluation today patient appears to be doing better in my opinion compared to his last visit. The good news is there is no signs of active infection systemically and locally I do not see any signs of infection either. He did have an x-ray which was negative that is great news he had a culture which showed MRSA but at the same time he is been on the doxycycline which has helped. I  do think we may want to extend this for 7 additional days 1/25; patient admitted to the clinic a few weeks ago. 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 We have been putting him under compression his edema control is a lot better but he is severe verricused skin on the left leg. He is really done quite well he still has an open area on the left medial calf and the left medial first metatarsal head. We have been using silver collagen on the leg silver alginate on the foot 07/27/2020 upon evaluation today patient appears to be doing decently well in regard to his wounds. He still has a lot of dry skin on the left leg. Some of this is starting to peel back and I think he may be able to have them out by removing some that today. Fortunately there is no signs of active infection at this time on the left leg although on the right leg he does appear to have swelling and erythema as well as some mild warmth to touch. This does have been concerned about the possibility of cellulitis although within the differential diagnosis I do think that potentially a DVT has to be at  least considered. We need to rule that out before proceeding would just call in the cellulitis. Especially since he is having pain in the posterior aspect of his calf muscle. 2/8; the patient had seen sparingly. He has severe skin changes of chronic lymphedema in the left leg thickened hyperkeratotic verrucous skin. He has an open wound on the medial part of the left first met head left mid tibia. He also has a rim of nonepithelialized skin in the anterior mid tibia. He brought in the AmLactin lotion that was been prescribed although I am not sure under compression and its utility. There concern about cellulitis on the right lower leg the last time he was here. He was put on on antibiotics. His DVT rule out was negative. The right leg looks fine he is using his stocking on this area 08/10/2020 upon evaluation today patient appears to be doing well with regard to his leg currently. He has been tolerating the dressing changes without complication. Fortunately there is no signs of active infection which is great news. Overall very pleased with where things stand. 2/22; the patient still has an area on the medial part of the left first met his head. This looks better than when I last saw this earlier this month he has a rim of epithelialization but still some surface debris. Mostly everything on the left leg is healed. There is still a vulnerable in the left mid tibia area. 08/30/2020 upon evaluation today patient appears to be doing much better in regard to his wounds on his foot. Fortunately there does not appear to be any signs [name], [name] ([id number]) [phone].pdf Page 11 of 19 of active infection systemically though locally we did culture this last week and it does appear that he does have MRSA currently. Nonetheless I think we will address that today I Minna send in a prescription for him in that regard. Overall though there does not appear to be any signs of significant  worsening. 09/07/2020 on evaluation today patient's wounds over his left foot appear to be doing excellent. I do not see any signs of infection there is some callus buildup this can require debridement for certain but overall I feel like he is managing quite nicely. He still using the  AmLactin cream which has been beneficial for him as well. 3/22; left foot wound is closed. There is no open area here. He is using ammonium lactate lotion to the lower extremities to help exfoliate dry cracked skin. He has compression stockings from elastic therapy in [place]. The wound on the medial part of his left first met head is healed today. READMISSION 04/12/2021 [name] is a patient we know fairly well he had a prolonged stay in clinic in 2019 with wounds on his left lateral and left anterior lower extremity in the setting of chronic venous insufficiency. More recently he was here earlier this year with predominantly an area on his left foot first metatarsal head plantar and he says the plantar foot broke down on its not long after we discharged him but he did not come back here. The last few months areas of broken down on his left anterior and again the left lateral lower extremity. The leg itself is very swollen chronically enlarged a lot of hyperkeratotic dry Berry Q skin in the left lower leg. His edema extends well into the thigh. He was seen by Dr. Donzetta Matters. He had ABIs on 03/02/2021 showing an ABI on the right of 1 with a TBI of 0.72 his ABI in the left at 1.09 TBI of 0.99. Monophasic and biphasic waveforms on the right. On the left monophasic waveforms were noted he went on to have an angiogram on 03/27/2021 this showed the aortic aortic and iliac segments were free of flow-limiting stenosis the left common femoral vein to evaluate the left femoral to anterior tibial artery bypass was unobstructed the bypass was patent without any areas of stenosis. We discharged the patient in bilateral juxta lite  stockings but very clearly that was not sufficient to control the swelling and maintain skin integrity. He is clearly going to need compression pumps. 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 This is fortunate because he is on his feet for long periods of time. 10/27; patient comes in with our intake nurse reporting copious amount of green drainage from the left anterior mid tibia the left dorsal foot and to a lesser extent the left medial mid tibia. We left the compression wrap on all week for the amount of edema in his left leg is quite a bit better. We use silver alginate as the primary dressing 11/3; edema control is good. Left anterior lower leg left medial lower leg and the plantar first metatarsal head. The left anterior lower leg required debridement. Deep tissue culture I did of this wound showed MRSA I put him on 10 days of doxycycline which she will start today. We have him in compression wraps. He has a security card and [employer] however he is retiring on November 15. We will need to then get him into a better offloading boot for the left foot perhaps a total contact cast 11/10; edema control is quite good. Left anterior and left medial lower leg wounds in the setting of chronic venous insufficiency and lymphedema. He also has a substantial area over the left plantar first metatarsal head. 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 He has significant hypergranulation on the left plantar foot wound. The patient is a diabetic but he does not have significant PAD 11/17; edema control is quite good. Left anterior and left  medial lower leg wounds look better. The really concerning area remains the area on the left plantar first metatarsal head. He has a rim of epithelialization. He has been using a surgical shoe The patient is now retired from a a  [employer] I have gone over with him the need to offload this area aggressively. Starting today with a forefoot off loader but . possibly a total contact cast. 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 Arrives with a very significant odor from I think all of the wounds on the left leg and the left foot. Because of this I did not put a total contact cast on him today but will could still consider this. His wife was having cataract surgery which is the reason he missed the appointment 12/6. I saw this man 5 days ago with a swelling below the popliteal fossa. I thought he actually might have a Baker's cyst however the DVT rule out study that we could arrange right away was negative the technician told me this was not a ruptured Baker's cyst. We attempted to get this aspirated by under ultrasound guidance in interventional radiology however all they did was an ultrasound however it shows an extensive fluid collection 62 x 8 x 9.4 in the left thigh and left calf. The patient states he thinks this started 8 days ago or so but he really is not complaining of any pain, fever or systemic symptoms. He has not ha 12/20; after some difficulty I managed to get the patient into see Dr. Donzetta Matters. Eventually he was taken into the hospital and had a drain put in the fluid collection below his left knee posteriorly extending into the posterior thigh. He still has the drain in place. Culture of this showed moderate staff aureus few Morganella and few Klebsiella he is now on doxycycline and ciprofloxacin as suggested by infectious disease he is on this for a month. The drain will remain in place until it stops draining 12/29; he comes in today with the 1 wound on his left leg and the area on the left plantar first met head significantly smaller. Both look healthy. He still has the drain in the left leg. He says  he has to change this daily. Follows up with Dr. Donzetta Matters on January 11. 06/29/2021; the wounds that I am following on the left leg and left first met head continued to be quite healthy. However the area where his inferior drain is in place had copious amounts of drainage which was green in color. The wound here is larger. Follows up with Dr. Gwenlyn Saran of vein and vascular his surgeon next week as well as infectious disease. He remains on ciprofloxacin and doxycycline. He is not complaining of excessive pain in either one of the drain areas 1/12; the patient saw vascular surgery and infectious disease. Vascular surgery has left the drain in place as there was still some notable drainage still see him back in 2 weeks. Dr. Velna Ochs stop the doxycycline and ciprofloxacin and I do not believe he follows up with them at this point. Culture I did last week showed both doxycycline resistant MRSA and Pseudomonas not sensitive to ciprofloxacin although only in rare titers 1/19; the patient's wound on the left anterior lower leg is just about healed. We have continued healing of the area that was medially on the left leg.  Left first plantar metatarsal head continues to get smaller. The major problem here is his 2 drain sites 1 on the left upper calf and lateral thigh. There is purulent drainage still from the left lateral thigh. I gave him antibiotics last week but we still have recultured. He has the drain in the area I think this is eventually going to have to come out. I suspect there will be a connecting wound to heal here perhaps with improved VAc 1/26; the patient had his drain removed by vein and vascular on 1/25/. This was a large pocket of fluid in his left thigh that seem to tunnel into his left upper calf. 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 His mention his Penrose drain was removed today. He now has a tunneling wound on his left calf and left thigh. Both of these probe widely towards each  other although I cannot really prove that they connect. Both wounds on his lower leg anteriorly are closed and his area over the first metatarsal head on his right foot continues to improve. We are using Hydrofera Blue here. He also saw infectious disease culture of the abscess they noted was polymicrobial with MRSA, Morganella and Klebsiella he was treated with doxycycline and ciprofloxacin for 4 weeks ending on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

## 2022-06-19 ENCOUNTER — Ambulatory Visit: Payer: Medicare Other | Admitting: Nurse Practitioner

## 2022-06-20 ENCOUNTER — Encounter: Payer: Self-pay | Admitting: Nurse Practitioner

## 2022-06-20 ENCOUNTER — Ambulatory Visit (INDEPENDENT_AMBULATORY_CARE_PROVIDER_SITE_OTHER): Payer: Medicare Other | Admitting: Nurse Practitioner

## 2022-06-20 VITALS — BP 133/74 | HR 69 | Ht 73.0 in | Wt 282.2 lb

## 2022-06-20 DIAGNOSIS — E782 Mixed hyperlipidemia: Secondary | ICD-10-CM

## 2022-06-20 DIAGNOSIS — Z794 Long term (current) use of insulin: Secondary | ICD-10-CM

## 2022-06-20 DIAGNOSIS — I10 Essential (primary) hypertension: Secondary | ICD-10-CM

## 2022-06-20 DIAGNOSIS — E1165 Type 2 diabetes mellitus with hyperglycemia: Secondary | ICD-10-CM

## 2022-06-20 LAB — POCT GLYCOSYLATED HEMOGLOBIN (HGB A1C): Hemoglobin A1C: 9.4 % — AB (ref 4.0–5.6)
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
## 2022-06-21 ENCOUNTER — Encounter (HOSPITAL_BASED_OUTPATIENT_CLINIC_OR_DEPARTMENT_OTHER): Payer: Medicare Other | Admitting: General Surgery

## 2022-06-21 DIAGNOSIS — E11621 Type 2 diabetes mellitus with foot ulcer: Secondary | ICD-10-CM | POA: Diagnosis not present

## 2022-06-21 NOTE — Progress Notes (Signed)
KHYAN, OATS ([id number]) [id number] Page 1 of 10 Visit Report for 06/21/2022 Arrival Information Details Patient Name: Date of Service: [name], [name] 06/21/2022 7:45 A M Medical Record Number: [id number] Patient Account Number: [id number] Date of Birth/Sex: Treating RN: 03/23/1951 (71 y.o. [name] Primary Care Herold Salguero: [name] Other Clinician: Referring Woodrow Drab: Treating Sebasthian Stailey/Extender: [name] in Treatment: 72 Visit Information History Since Last Visit Added or deleted any medications: No Patient Arrived: Ambulatory Any new allergies or adverse reactions: No Arrival Time: 07:44 Had a fall or experienced change in No Accompanied By: self activities of daily living that may affect Transfer Assistance: None risk of falls: Patient Identification Verified: Yes Signs or symptoms of abuse/neglect since last visito No Secondary Verification Process Completed: Yes Hospitalized since last visit: No Patient Requires Transmission-Based Precautions: No Implantable device outside of the clinic excluding No Patient Has Alerts: Yes cellular tissue based products placed in the center since last visit: Has Dressing in Place as Prescribed: Yes Has Footwear/Offloading in Place as Prescribed: Yes Left: T Contact Cast otal Pain Present Now: No Electronic Signature(s) Signed: 06/21/2022 4:31:58 PM By: Adline Peals Entered By: Adline Peals on 06/21/2022 07:49:02 -------------------------------------------------------------------------------- Encounter Discharge Information Details Patient Name: Date of Service: [name]. 06/21/2022 7:45 A M Medical Record Number: [id number] Patient Account Number: [id number] Date of Birth/Sex: Treating RN: [birth year] (71 y.o. [name] Primary Care Zelig Gacek: [name] Other Clinician: Referring Yarielis Funaro: Treating Dawnmarie Breon/Extender: [name] in Treatment: 41 Encounter Discharge Information Items Post Procedure Vitals Discharge Condition: Stable Temperature (F): 98 Ambulatory Status: Ambulatory Pulse (bpm): 80 Discharge Destination: Home Respiratory Rate (breaths/min): 18 Transportation: Private Auto Blood Pressure (mmHg): 164/85 Accompanied By: self Schedule Follow-up Appointment: Yes Clinical Summary of Care: Patient Declined Electronic Signature(s) Signed: 06/21/2022 4:31:58 PM By: Adline Peals Entered By: Adline Peals on 06/21/2022 08:51:01 [name] ([id number]) [id number] Page 2 of 10 -------------------------------------------------------------------------------- Lower Extremity Assessment Details Patient Name: Date of Service: [name], [name] 06/21/2022 7:45 A M Medical Record Number: [id number] Patient Account Number: [id number] Date of Birth/Sex: Treating RN: 12/25/1950 (71 y.o. [name] Primary Care Jayven Naill: [name] Other Clinician: Referring Laurianne [name]: Treating Umaiza Matusik/Extender: [name] in Treatment: 62 Edema Assessment Assessed: [name]: No] [name]: No] Edema: [Left: Ye] [Right: s] Calf Left: Right: Point of Measurement: 41 cm From Medial Instep 47.3 cm Ankle Left: Right: Point of Measurement: 10 cm From Medial Instep 28.8 cm Vascular Assessment Pulses: Dorsalis Pedis Palpable: [Left:Yes] Electronic Signature(s) Signed: 06/21/2022 4:31:58 PM By: Adline Peals Entered By: Adline Peals on 06/21/2022 08:03:32 -------------------------------------------------------------------------------- Multi Wound Chart Details Patient Name: Date of Service: [name]. 06/21/2022 7:45 A M Medical Record Number: [id number] Patient Account Number: [id number] Date of Birth/Sex: Treating RN: [birth year] (71 y.o. M) Primary Care Jamarri Vuncannon: [name] Other Clinician: Referring  Emori Kamau: Treating Alvis Pulcini/Extender: [name] in Treatment: 62 Vital Signs Height(in): 74 Capillary Blood Glucose(mg/dl): 195 Weight(lbs): 238 Pulse(bpm): 80 Body Mass Index(BMI): 30.6 Blood Pressure(mmHg): 164/85 Temperature(F): 98 Respiratory Rate(breaths/min): 18 [18:Photos:] Left, Plantar Metatarsal head first Left, Proximal, Lateral Lower Leg Left, Anterior Lower Leg Wound Location: Gradually Appeared Bump Shear/Friction Wounding Event: BURNIE, THERIEN ([id number]) [id number] Page 3 of 10 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 Primary Etiology: Extremity Glaucoma, Sleep Apnea, Glaucoma, Sleep Apnea, Glaucoma, Sleep Apnea, Comorbid History: Hypertension, Peripheral Arterial Hypertension, Peripheral Arterial Hypertension, Peripheral Arterial Disease, Peripheral Venous Disease, Disease, Peripheral Venous Disease,  Disease, Peripheral Venous Disease, Type II Diabetes, Gout, Osteoarthritis, Type II Diabetes, Gout, Osteoarthritis, Type II Diabetes, Gout, Osteoarthritis, Neuropathy Neuropathy Neuropathy 08/23/2020 06/03/2021 06/21/2022 Date Acquired: 43 54 0 Weeks of Treatment: Open Open Open Wound Status: No No No Wound Recurrence: No Yes No Clustered Wound: N/A 3 N/A Clustered Quantity: 0.1x0.1x0.1 7x1.4x0.1 1x0.5x0.1 Measurements L x W x D (cm) 0.008 7.697 0.393 A (cm) : rea 0.001 0.77 0.039 Volume (cm) : 99.90% -366.80% N/A % Reduction in A rea: 100.00% 41.60% N/A % Reduction in Volume: Grade 2 Full Thickness With Exposed Support Full Thickness Without Exposed Classification: Structures Support Structures Small Medium Medium Exudate A mount: Serosanguineous Serous Serosanguineous Exudate Type: red, brown amber red, brown Exudate Color: Flat and Intact Fibrotic scar, thickened scar Distinct, outline attached Wound Margin: Small (1-33%) Large (67-100%) Large (67-100%) Granulation A  mount: Pink Red, Pink Red Granulation Quality: None Present (0%) Small (1-33%) None Present (0%) Necrotic A mount: N/A Eschar, Adherent Slough N/A Necrotic Tissue: Fascia: No Fat Layer (Subcutaneous Tissue): Yes Fat Layer (Subcutaneous Tissue): Yes Exposed Structures: Fat Layer (Subcutaneous Tissue): No Fascia: No Fascia: No Tendon: No Tendon: No Tendon: No Muscle: No Muscle: No Muscle: No Joint: No Joint: No Joint: No Bone: No Bone: No Bone: No Large (67-100%) Medium (34-66%) None Epithelialization: N/A Debridement - Excisional N/A Debridement: Pre-procedure Verification/Time Out N/A 08:17 N/A Taken: N/A Lidocaine 4% Topical Solution N/A Pain Control: N/A Subcutaneous, Slough N/A Tissue Debrided: N/A Skin/Subcutaneous Tissue N/A Level: N/A 9.8 N/A Debridement A (sq cm): rea N/A Curette N/A Instrument: N/A Minimum N/A Bleeding: N/A Pressure N/A Hemostasis A chieved: N/A Procedure was tolerated well N/A Debridement Treatment Response: N/A 7x1.4x0.1 N/A Post Debridement Measurements L x W x D (cm) N/A 0.77 N/A Post Debridement Volume: (cm) Callus: Yes Scarring: Yes No Abnormalities Noted Periwound Skin Texture: Dry/Scaly: Yes Dry/Scaly: Yes Maceration: Yes Periwound Skin Moisture: Maceration: No No Abnormalities Noted Hemosiderin Staining: Yes Hemosiderin Staining: Yes Periwound Skin Color: No Abnormality No Abnormality No Abnormality Temperature: N/A Yes N/A Tenderness on Palpation: T Contact Cast otal Debridement Chemical Cauterization Procedures Performed: Treatment Notes Electronic Signature(s) Signed: 06/21/2022 8:22:50 AM By: Fredirick Maudlin MD FACS Entered By: Fredirick Maudlin on 06/21/2022 08:22:50 -------------------------------------------------------------------------------- Multi-Disciplinary Care Plan Details Patient Name: Date of Service: [name]. 06/21/2022 7:45 A M Medical Record Number: [id number] Patient Account  Number: [id number] Date of Birth/Sex: Treating RN: 1950/10/01 (71 y.o. [name] Primary Care Herbie Lehrmann: [name] Other Clinician: Referring Gertrude Bucks: Treating Carlie Solorzano/Extender: [name] in Treatment: [address], [place] ([id number]) [id number] Page 4 of 10 Multidisciplinary Care Plan reviewed with physician Active Inactive Venous Leg Ulcer Nursing Diagnoses: Knowledge deficit related to disease process and management Potential for venous Insuffiency (use before diagnosis confirmed) Goals: Patient will maintain optimal edema control Date Initiated: 07/27/2021 Target Resolution Date: 02/23/2023 Goal Status: Active Interventions: Assess peripheral edema status every visit. Treatment Activities: Therapeutic compression applied : 07/27/2021 Notes: Wound/Skin Impairment Nursing Diagnoses: Impaired tissue integrity Knowledge deficit related to ulceration/compromised skin integrity Goals: Patient will have a decrease in wound volume by X% from date: (specify in notes) Date Initiated: 04/12/2021 Date Inactivated: 01/04/2022 Target Resolution Date: 04/23/2021 Goal Status: Met Patient/caregiver will verbalize understanding of skin care regimen Date Initiated: 01/04/2022 Target Resolution Date: 02/23/2023 Goal Status: Active Ulcer/skin breakdown will have a volume reduction of 30% by week 4 Date Initiated: 04/12/2021 Date Inactivated: 04/27/2021 Target Resolution Date: 04/27/2021 Goal Status: Unmet Unmet Reason: infection Ulcer/skin breakdown will have  a volume reduction of 50% by week 8 Date Initiated: 04/27/2021 Date Inactivated: 06/29/2021 Target Resolution Date: 06/24/2021 Goal Status: Met Interventions: Assess patient/caregiver ability to obtain necessary supplies Assess patient/caregiver ability to perform ulcer/skin care regimen upon admission and as needed Assess ulceration(s) every visit Notes: Electronic  Signature(s) Signed: 06/21/2022 4:31:58 PM By: Adline Peals Entered By: Adline Peals on 06/21/2022 08:07:05 -------------------------------------------------------------------------------- Pain Assessment Details Patient Name: Date of Service: [name]. 06/21/2022 7:45 A M Medical Record Number: [id number] Patient Account Number: [id number] Date of Birth/Sex: Treating RN: 1951/04/12 (71 y.o. [name] Primary Care Noemie Devivo: [name] Other Clinician: Referring Briannah Lona: Treating Floris Neuhaus/Extender: [name] in Treatment: 29 Active Problems Location of Pain Severity and Description of Pain Patient Has Paino No MOHANNAD, [name] ([id number]) [phone].pdf Page 5 of 10 Patient Has Paino No Site Locations Rate the pain. Current Pain Level: 0 Pain Management and Medication Current Pain Management: Electronic Signature(s) Signed: 06/21/2022 4:31:58 PM By: Adline Peals Entered By: Adline Peals on 06/21/2022 07:49:09 -------------------------------------------------------------------------------- Patient/Caregiver Education Details Patient Name: Date of Service: [name] 12/28/2023andnbsp7:45 A M Medical Record Number: [id number] Patient Account Number: [id number] Date of Birth/Gender: Treating RN: 04-Apr-1951 (71 y.o. [name] Primary Care Physician: [name] Other Clinician: Referring Physician: Treating Physician/Extender: [name] in Treatment: 5 Education Assessment Education Provided To: Patient Education Topics Provided Wound/Skin Impairment: Methods: Explain/Verbal Responses: Reinforcements needed, State content correctly Electronic Signature(s) Signed: 06/21/2022 4:31:58 PM By: Adline Peals Entered By: Adline Peals on 06/21/2022  08:07:22 -------------------------------------------------------------------------------- Wound Assessment Details Patient Name: Date of Service: [name]. 06/21/2022 7:45 A M Medical Record Number: [id number] Patient Account Number: [id number] Date of Birth/Sex: Treating RN: 1950/08/17 (71 y.o. Rogerio, Boutelle, Arnette Norris ([id number]) [id number] Page 6 of 10 Primary Care [name]: [name] Other Clinician: Referring Latrish Mogel: Treating Arul Farabee/Extender: [name] in Treatment: 62 Wound Status Wound Number: 18 Primary Diabetic Wound/Ulcer of the Lower Extremity Etiology: Wound Location: Left, Plantar Metatarsal head first Wound Open Wounding Event: Gradually Appeared Status: Date Acquired: 08/23/2020 Comorbid Glaucoma, Sleep Apnea, Hypertension, Peripheral Arterial Disease, Weeks Of Treatment: 62 History: Peripheral Venous Disease, Type II Diabetes, Gout, Osteoarthritis, Clustered Wound: No Neuropathy Photos Wound Measurements Length: (cm) 0.1 Width: (cm) 0.1 Depth: (cm) 0.1 Area: (cm) 0.008 Volume: (cm) 0.001 % Reduction in Area: 99.9% % Reduction in Volume: 100% Epithelialization: Large (67-100%) Tunneling: No Undermining: No Wound Description Classification: Grade 2 Wound Margin: Flat and Intact Exudate Amount: Small Exudate Type: Serosanguineous Exudate Color: red, brown Foul Odor After Cleansing: No Slough/Fibrino Yes Wound Bed Granulation Amount: Small (1-33%) Exposed Structure Granulation Quality: Pink Fascia Exposed: No Necrotic Amount: None Present (0%) Fat Layer (Subcutaneous Tissue) Exposed: No Tendon Exposed: No Muscle Exposed: No Joint Exposed: No Bone Exposed: No Periwound Skin Texture Texture Color No Abnormalities Noted: No No Abnormalities Noted: Yes Callus: Yes Temperature / Pain Temperature: No Abnormality Moisture No Abnormalities Noted: No Dry / Scaly:  Yes Maceration: No Treatment Notes Wound #18 (Metatarsal head first) Wound Laterality: Plantar, Left Cleanser Soap and Water Discharge Instruction: May shower and wash wound with dial antibacterial soap and water prior to dressing change. Wound Cleanser Discharge Instruction: Cleanse the wound with wound cleanser prior to applying a clean dressing using gauze sponges, not tissue or cotton balls. Peri-Wound Care Sween Lotion (Moisturizing lotion) Discharge Instruction: Apply moisturizing lotion to the leg DAINE, GUNTHER ([id number]) [phone].pdf Page 7 of 10 Topical Primary  Dressing Sorbalgon AG Dressing, 4x4 (in/in) Discharge Instruction: Apply to wound bed as instructed Secondary Dressing Optifoam Non-Adhesive Dressing, 4x4 in Discharge Instruction: Apply over primary dressing as directed. Zetuvit Plus 4x8 in Discharge Instruction: Apply over primary dressing as directed. Secured With [employer], 4.5x3.1 (in/yd) Discharge Instruction: Secure with Kerlix as directed. 22M Medipore H Soft Cloth Surgical T ape, 4 x 10 (in/yd) Discharge Instruction: Secure with tape as directed. Compression Wrap Compression Stockings Add-Ons Electronic Signature(s) Signed: 06/21/2022 4:31:58 PM By: Adline Peals Entered By: Adline Peals on 06/21/2022 08:09:15 -------------------------------------------------------------------------------- Wound Assessment Details Patient Name: Date of Service: [name]. 06/21/2022 7:45 A M Medical Record Number: [id number] Patient Account Number: [id number] Date of Birth/Sex: Treating RN: 09/20/1950 (71 y.o. [name] Primary Care Tiare Rohlman: [name] Other Clinician: Referring Jawann Urbani: Treating Dantae Meunier/Extender: [name] in Treatment: 62 Wound Status Wound Number: 22 Primary Cyst Etiology: Wound Location: Left, Proximal, Lateral Lower Leg Wound Open Wounding  Event: Bump Status: Date Acquired: 06/03/2021 Comorbid Glaucoma, Sleep Apnea, Hypertension, Peripheral Arterial Disease, Weeks Of Treatment: 54 History: Peripheral Venous Disease, Type II Diabetes, Gout, Osteoarthritis, Clustered Wound: Yes Neuropathy Photos Wound Measurements Length: (cm) 7 Width: (cm) 1.4 Depth: (cm) 0.1 Clustered Quantity: 3 Area: (cm) 7.697 Volume: (cm) 0.77 Mohamud, Eliazar W ([id number]) % Reduction in Area: -366.8% % Reduction in Volume: 41.6% Epithelialization: Medium (34-66%) Tunneling: No Undermining: No [id number] Page 8 of 10 Wound Description Classification: Full Thickness With Exposed Support Structures Wound Margin: Fibrotic scar, thickened scar Exudate Amount: Medium Exudate Type: Serous Exudate Color: amber Foul Odor After Cleansing: No Slough/Fibrino Yes Wound Bed Granulation Amount: Large (67-100%) Exposed Structure Granulation Quality: Red, Pink Fascia Exposed: No Necrotic Amount: Small (1-33%) Fat Layer (Subcutaneous Tissue) Exposed: Yes Necrotic Quality: Eschar, Adherent Slough Tendon Exposed: No Muscle Exposed: No Joint Exposed: No Bone Exposed: No Periwound Skin Texture Texture Color No Abnormalities Noted: No No Abnormalities Noted: No Scarring: Yes Hemosiderin Staining: Yes Moisture Temperature / Pain No Abnormalities Noted: No Temperature: No Abnormality Dry / Scaly: Yes Tenderness on Palpation: Yes Treatment Notes Wound #22 (Lower Leg) Wound Laterality: Left, Lateral, Proximal Cleanser Soap and Water Discharge Instruction: May shower and wash wound with dial antibacterial soap and water prior to dressing change. Wound Cleanser Discharge Instruction: Cleanse the wound with wound cleanser prior to applying a clean dressing using gauze sponges, not tissue or cotton balls. Peri-Wound Care Sween Lotion (Moisturizing lotion) Discharge Instruction: Apply moisturizing lotion to the  leg Topical Skintegrity Hydrogel 4 (oz) Discharge Instruction: Apply hydrogel as directed Primary Dressing Endoform 2x2 in Discharge Instruction: Moisten with Hydrogel or saline Secondary Dressing ABD Pad, 8x10 Discharge Instruction: Apply over primary dressing as directed. Secured With Elastic Bandage 4 inch (ACE bandage) Discharge Instruction: Secure with ACE bandage as directed. Compression Wrap Compression Stockings Add-Ons Electronic Signature(s) Signed: 06/21/2022 4:31:58 PM By: Adline Peals Entered By: Adline Peals on 06/21/2022 08:10:03 [name] ([id number]) [id number] Page 9 of 10 -------------------------------------------------------------------------------- Wound Assessment Details Patient Name: Date of Service: [name], [name] 06/21/2022 7:45 A M Medical Record Number: [id number] Patient Account Number: [id number] Date of Birth/Sex: Treating RN: Sep 03, 1950 (71 y.o. [name] Primary Care Meilah Delrosario: [name] Other Clinician: Referring Kashmere Daywalt: Treating Jamyia Fortune/Extender: [name] in Treatment: 62 Wound Status Wound Number: 30 Primary Abrasion Etiology: Wound Location: Left, Anterior Lower Leg Wound Open Wounding Event: Shear/Friction Status: Date Acquired: 06/21/2022 Comorbid Glaucoma, Sleep Apnea, Hypertension, Peripheral Arterial Disease, Weeks Of Treatment: 0  History: Peripheral Venous Disease, Type II Diabetes, Gout, Osteoarthritis, Clustered Wound: No Neuropathy Photos Wound Measurements Length: (cm) 1 Width: (cm) 0.5 Depth: (cm) 0.1 Area: (cm) 0.393 Volume: (cm) 0.039 % Reduction in Area: % Reduction in Volume: Epithelialization: None Tunneling: No Undermining: No Wound Description Classification: Full Thickness Without Exposed Support Structures Wound Margin: Distinct, outline attached Exudate Amount: Medium Exudate Type: Serosanguineous Exudate  Color: red, brown Foul Odor After Cleansing: No Slough/Fibrino No Wound Bed Granulation Amount: Large (67-100%) Exposed Structure Granulation Quality: Red Fascia Exposed: No Necrotic Amount: None Present (0%) Fat Layer (Subcutaneous Tissue) Exposed: Yes Tendon Exposed: No Muscle Exposed: No Joint Exposed: No Bone Exposed: No Periwound Skin Texture Texture Color No Abnormalities Noted: Yes No Abnormalities Noted: No Hemosiderin Staining: Yes Moisture No Abnormalities Noted: No Temperature / Pain Maceration: Yes Temperature: No Abnormality Treatment Notes Wound #30 (Lower Leg) Wound Laterality: Left, Anterior Cleanser Soap and Water Discharge Instruction: May shower and wash wound with dial antibacterial soap and water prior to dressing change. Wound Cleanser Discharge Instruction: Cleanse the wound with wound cleanser prior to applying a clean dressing using gauze sponges, not tissue or cotton balls. Peri-Wound Care CORNELLIUS, KROPP ([id number]) [id number] Page 10 of 10 Topical Primary Dressing Sorbalgon AG Dressing 2x2 (in/in) Discharge Instruction: Apply to wound bed as instructed Secondary Dressing Zetuvit Plus Silicone Border Dressing 4x4 (in/in) Discharge Instruction: Apply silicone border over primary dressing as directed. Secured With Compression Wrap Compression Stockings Environmental education officer) Signed: 06/21/2022 4:31:58 PM By: Adline Peals Entered By: Adline Peals on 06/21/2022 08:09:42 -------------------------------------------------------------------------------- Vitals Details Patient Name: Date of Service: [name]. 06/21/2022 7:45 A M Medical Record Number: [id number] Patient Account Number: [id number] Date of Birth/Sex: Treating RN: 07/18/1950 (71 y.o. [name] Primary Care Alexes Lamarque: [name] Other Clinician: Referring Liliyana Thobe: Treating Getsemani Lindon/Extender: [name] in Treatment: 39 Vital Signs Time Taken: 07:49 Temperature (F): 98 Height (in): 74 Pulse (bpm): 80 Weight (lbs): 238 Respiratory Rate (breaths/min): 18 Body Mass Index (BMI): 30.6 Blood Pressure (mmHg): 164/85 Capillary Blood Glucose (mg/dl): 195 Reference Range: 80 - 120 mg / dl Electronic Signature(s) Signed: 06/21/2022 4:31:58 PM By: Adline Peals Entered By: Adline Peals on 06/21/2022 07:51:42

## 2022-06-21 NOTE — Progress Notes (Signed)
RUBEL, HECKARD ([id number]) [id number] Page 1 of 20 Visit Report for 06/21/2022 Chief Complaint Document Details Patient Name: Date of Service: [name], [name] 06/21/2022 7:45 A M Medical Record Number: [id number] Patient Account Number: [id number] Date of Birth/Sex: Treating RN: [birth year] (71 y.o. M) Primary Care Provider: [name] Other Clinician: Referring Provider: Treating Provider/Extender: [name] in Treatment: 62 Information Obtained from: Patient Chief Complaint Left leg and foot ulcers 04/12/2021; patient is here for wounds on his left lower leg and left plantar foot over the first metatarsal head Electronic Signature(s) Signed: 06/21/2022 8:22:59 AM By: Fredirick Maudlin MD FACS Entered By: Fredirick Maudlin on 06/21/2022 08:22:59 -------------------------------------------------------------------------------- Debridement Details Patient Name: Date of Service: [name]. 06/21/2022 7:45 A M Medical Record Number: [id number] Patient Account Number: [id number] Date of Birth/Sex: Treating RN: [birth year] (71 y.o. [name] Primary Care Provider: [name] Other Clinician: Referring Provider: Treating Provider/Extender: [name] in Treatment: 62 Debridement Performed for Assessment: Wound #22 Left,Proximal,Lateral Lower Leg Performed By: Physician Fredirick Maudlin, MD Debridement Type: Debridement Level of Consciousness (Pre-procedure): Awake and Alert Pre-procedure Verification/Time Out Yes - 08:17 Taken: Start Time: 08:17 Pain Control: Lidocaine 4% T opical Solution T Area Debrided (L x W): otal 7 (cm) x 1.4 (cm) = 9.8 (cm) Tissue and other material debrided: Non-Viable, Slough, Subcutaneous, Slough Level: Skin/Subcutaneous Tissue Debridement Description: Excisional Instrument: Curette Bleeding: Minimum Hemostasis Achieved: Pressure Response to Treatment:  Procedure was tolerated well Level of Consciousness (Post- Awake and Alert procedure): Post Debridement Measurements of Total Wound Length: (cm) 7 Width: (cm) 1.4 Depth: (cm) 0.1 Volume: (cm) 0.77 Character of Wound/Ulcer Post Debridement: Improved Post Procedure Diagnosis Same as [name] ([id number]) [id number] Page 2 of 20 Notes scribed for Dr. Celine Ahr by Adline Peals, RN Electronic Signature(s) Signed: 06/21/2022 10:27:26 AM By: Fredirick Maudlin MD FACS Signed: 06/21/2022 4:31:58 PM By: Sabas Sous By: Adline Peals on 06/21/2022 08:17:44 -------------------------------------------------------------------------------- HPI Details Patient Name: Date of Service: [name]. 06/21/2022 7:45 A M Medical Record Number: [id number] Patient Account Number: [id number] Date of Birth/Sex: Treating RN: 15-Apr-1951 (71 y.o. M) Primary Care Provider: [name] Other Clinician: Referring Provider: Treating Provider/Extender: [name] in Treatment: 78 History of Present Illness HPI Description: 10/11/17; [name] is a 71 year old man who tells me that in 2015 he slipped down the latter traumatizing his left leg. He developed a wound in the same spot the area that we are currently looking at. He states this closed over for the most part although he always felt it was somewhat unstable. In 2016 he hit the same area with the door of his car had this reopened. He tells me that this is never really closed although sometimes an inflow it remains open on a constant basis. He has not been using any specific dressing to this except for topical antibiotics the nature of which were not really sure. His primary doctor did send him to see Dr. [name] of interventional cardiology. He underwent an angiogram on 08/06/17 and he underwent a PTA and directional atherectomy of the lesser distal SFA and popliteal  arteries which resulted in brisk improvement in blood flow. It was noted that he had 2 vessel runoff through the anterior tibial and peroneal. He is also been to see vascular and interventional radiologist. He was not felt to have any significant superficial venous insufficiency. Presumably is not a candidate for any ablation. It was suggested he  come here for wound care. The patient is a type II diabetic on insulin. He also has a history of venous insufficiency. ABIs on the left were noncompressible in our clinic 10/21/17; patient we admitted to the clinic last week. 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 We put Iodosorb on him after an aggressive debridement and 3 layer compression. He complained of pain in his ankle and itching with is skin in fact he scratched the area on the medial calf superiorly at the rim of our wraps and he has 2 small open areas in that location today which are new. I changed his primary dressing today to silver collagen. As noted he is already had revascularization and does not have any significant superficial venous insufficiency that would be amenable to ablation 10/28/17; patient admitted to the clinic 2 weeks ago. He has a smaller Wound. Scratch injury from last week revealed. There is large wound over the tibial area. This is smaller. Granulation looks healthy. No need for debridement. 11/04/17; the wound on the left lateral calf looks better. Improved dimensions. Surface of this looks better. We've been maintaining him and Kerlix Coban wraps. He finds this much more comfortable. Silver collagen dressing 11/11/17; left lateral Wound continues to look healthy be making progress. Using a #5 curet I removed removed nonviable skin from the surface of the wound and then necrotic debris from the wound surface. Surface of the wound continues to look healthy. He also has an open area on the left great toenail  bed. We've been using topical antibiotics. 11/19/17; left anterior lateral wound continues to look healthy but it's not closed. He also had a small wound above this on the left leg Initially traumatic wounds in the setting of significant chronic venous insufficiency and stasis dermatitis 11/25/17; left anterior wounds superiorly is closed still a small wound inferiorly. 12/02/17; left anterior tibial area. Arrives today with adherent callus. Post debridement clearly not completely closed. Hydrofera Blue under 3 layer compression. 12/09/17; left anterior tibia. Circumferential eschar however the wound bed looks stable to improved. We've been using Hydrofera Blue under 3 layer compression 12/17/17; left anterior tibia. Apparently this was felt to be closed however when the wrap was taken off there is a skin tear to reopen wounds in the same area we've been using Hydrofera Blue under 3 layer compression 12/23/17 left anterior tibia. Not close to close this week apparently the [facility] was stuck to this again. Still circumferential eschar requiring debridement. I put a contact layer on this this time under the Hydrofera Blue 12/31/17; left anterior tibia. Wound is better slight amount of hyper-granulation. Using Hydrofera Blue over Adaptic. 01/07/18; left anterior tibia. The wound had some surface eschar however after this was removed he has no open wound.he was already revascularized by Dr. [name] when he came to our clinic with atherectomy of the left SFA and popliteal artery. He was also sent to interventional radiology for venous reflux studies. He was not felt to have significant reflux but certainly has chronic venous changes of his skin with hemosiderin deposition around this area. He will definitely need to lubricate his skin and wear compression stocking and I've talked to him about this. READMISSION 05/26/2018 This is a now 71 year old man we cared for with traumatic wounds on his left anterior  lower extremity. He had been previously revascularized during that admission by Dr. [name]. Apparently in follow-up Dr. [name] noted that he  had deterioration in his arterial status. He underwent a stent placement in the distal left SFA on 04/22/2018. 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 He went back to the angiography suite on 04/30/2018 he underwent PTA and balloon angioplasty of the occluded left mid anterior tibial artery, thrombotic occlusion went from 100 to 0% which reconstitutes the posterior tibial artery. He had thrombectomy and aspiration of the peroneal artery. The stent placed in the distal SFA left SFA was still occluded. 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 He has been rec[facility] after the most recent hospitalization. He is going home tomorrow. He tells me that 2 weeks ago he traumatized the tip of his left fifth toe. He came in urgently for our review of this. This was a history of before I noted that Dr. [name] had already noted dry gangrenous changes of the left fifth toe [name], [name] ([id number]) [id number] Page 3 of 20 06/09/2018; 2-week follow-up. I did contact Dr. [name] after his last appointment and he apparently saw 1 of Dr. Irven Shelling colleagues the next day. He does not follow-up with Dr. [name] himself until Thursday of this week. 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 Nevertheless there is no evidence of infection no drainage and no pain. He had a new area that this week when we were signing him in today on the left anterior mid tibia area, this is in close proximity to the previous wound we have dealt with in this  clinic. 06/23/2018; 2-week follow-up. I did not receive a recent note from Dr. [name] to review today. Our office is trying to obtain this. He is apparently not planning to do further vascular interventions and wondered about compression to try and help with the patient's chronic venous insufficiency. However we are also concerned about the arterial flow. He arrives in clinic today with a new area on the left third toe. The areas on the calf/anterior tibia are close to closing. The left fifth toe is still mummified using Betadine. -In reviewing things with the patient he has what sounds like claudication with mild to moderate amount of activity. 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 I prescribed Levaquin over the phone while we attempted to arrange a plan of care. However the patient called yesterday to report he had low-grade fever and he came in today acutely. There is been a marked deterioration in the left third toe with spreading cellulitis up into the dorsal left foot. He was referred to the emergency room. Readmission: 06/29/2020 patient presents today for reevaluation here in our clinic he was previously treated by Dr. Dellia Nims at the latter part of 2019 in 2 the beginning of 2020. Subsequently we have not seen him since that time in the interim he did have evaluation with vein and vascular specialist specifically Dr. Anice Paganini who did perform quite extensive work for a left femoral to anterior tibial artery bypass. With that being said in the interim the patient has developed significant lymphedema and has wounds that he tells me have really never healed in regard to the incision site on the left leg. He also has multiple  wounds on the feet for various reasons some of which is that he tends to pick at his feet. Fortunately there is no signs of active infection systemically at this time he does have some wounds that are little bit deeper but most are fairly  superficial he seems to have good blood flow and overall everything appears to be healthy I see no bone exposed and no obvious signs of osteomyelitis. I do not know that he necessarily needs a x-ray at this point although that something we could consider depending on how things progress. The patient does have a history of lymphedema, diabetes, this is type II, chronic kidney disease stage III, hypertension, and history of peripheral vascular disease. 07/05/2020; patient admitted last week. Is a patient I remember from 2019 he had a spreading infection involving the left foot and we sent him to the hospital. 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 He subsequently had a left femoral to anterior tibial bypass by Dr.Cain vein and vascular. He also has severe lymphedema with chronic skin changes related to that on the left leg. The most problematic area that was new today was on the left medial great toe. This was apparently a small area last week there was purulent drainage which our intake nurse cultured. Also areas on the left medial foot and heel left lateral foot. He has 2 areas on the left medial calf left lateral calf in the setting of the severe lymphedema. 07/13/2020 on evaluation today patient appears to be doing better in my opinion compared to his last visit. The good news is there is no signs of active infection systemically and locally I do not see any signs of infection either. He did have an x-ray which was negative that is great news he had a culture which showed MRSA but at the same time he is been on the doxycycline which has helped. I do think we may want to extend this for 7 additional days 1/25; patient admitted to the clinic a few weeks ago. 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 We have been putting him under compression his edema control is a lot better but he is severe verricused skin on the  left leg. He is really done quite well he still has an open area on the left medial calf and the left medial first metatarsal head. We have been using silver collagen on the leg silver alginate on the foot 07/27/2020 upon evaluation today patient appears to be doing decently well in regard to his wounds. He still has a lot of dry skin on the left leg. Some of this is starting to peel back and I think he may be able to have them out by removing some that today. Fortunately there is no signs of active infection at this time on the left leg although on the right leg he does appear to have swelling and erythema as well as some mild warmth to touch. This does have been concerned about the possibility of cellulitis although within the differential diagnosis I do think that potentially a DVT has to be at least considered. We need to rule that out before proceeding would just call in the cellulitis. Especially since he is having pain in the posterior aspect of his calf muscle. 2/8; the patient had seen sparingly. He has severe skin changes of chronic lymphedema in the left leg thickened hyperkeratotic verrucous skin. He  has an open wound on the medial part of the left first met head left mid tibia. He also has a rim of nonepithelialized skin in the anterior mid tibia. He brought in the AmLactin lotion that was been prescribed although I am not sure under compression and its utility. There concern about cellulitis on the right lower leg the last time he was here. He was put on on antibiotics. His DVT rule out was negative. The right leg looks fine he is using his stocking on this area 08/10/2020 upon evaluation today patient appears to be doing well with regard to his leg currently. He has been tolerating the dressing changes without complication. Fortunately there is no signs of active infection which is great news. Overall very pleased with where things stand. 2/22; the patient still has an area on the medial  part of the left first met his head. This looks better than when I last saw this earlier this month he has a rim of epithelialization but still some surface debris. Mostly everything on the left leg is healed. There is still a vulnerable in the left mid tibia area. 08/30/2020 upon evaluation today patient appears to be doing much better in regard to his wounds on his foot. 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 Nonetheless I think we will address that today I Minna send in a prescription for him in that regard. Overall though there does not appear to be any signs of significant worsening. 09/07/2020 on evaluation today patient's wounds over his left foot appear to be doing excellent. I do not see any signs of infection there is some callus buildup this can require debridement for certain but overall I feel like he is managing quite nicely. He still using the AmLactin cream which has been beneficial for him as well. 3/22; left foot wound is closed. There is no open area here. He is using ammonium lactate lotion to the lower extremities to help exfoliate dry cracked skin. He has compression stockings from elastic therapy in . The wound on the medial part of his left first met head is healed today. READMISSION 04/12/2021 Mr. Enlow is a patient we know fairly well he had a prolonged stay in clinic in 2019 with wounds on his left lateral and left anterior lower extremity in the setting of chronic venous insufficiency. More recently he was here earlier this year with predominantly an area on his left foot first metatarsal head plantar and he says the plantar foot broke down on its not long after we discharged him but he did not come back here. The last few months areas of broken down on his left anterior and again the left lateral lower extremity. The leg itself  is very swollen chronically enlarged a lot of hyperkeratotic dry Berry Q skin in the left lower leg. His edema extends well into the thigh. He was seen by Dr. Donzetta Matters. He had ABIs on 03/02/2021 showing an ABI on the right of 1 with a TBI of 0.72 his ABI in the left at 1.09 TBI of 0.99. Monophasic and biphasic waveforms on the right. On the left monophasic waveforms were noted he went on to have an angiogram on 03/27/2021 this showed the aortic aortic and iliac segments were free of flow-limiting stenosis the left common femoral vein to evaluate the left femoral to anterior tibial artery  bypass was unobstructed the bypass was patent without any areas of stenosis. We discharged the patient in bilateral juxta lite stockings but very clearly that was not sufficient to control the swelling and maintain skin integrity. He is clearly going to need compression pumps. 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 This is fortunate because he is on his feet for long periods of time. 10/27; patient comes in with our intake nurse reporting copious amount of green drainage from the left anterior mid tibia the left dorsal foot and to a lesser extent the left medial mid tibia. We left the compression wrap on all week for the amount of edema in his left leg is quite a bit better. We use silver alginate as the primary dressing 11/3; edema control is good. Left anterior lower leg left medial lower leg and the plantar first metatarsal head. The left anterior lower leg required debridement. Deep tissue culture I did of this wound showed MRSA I put him on 10 days of doxycycline which she will start today. We have him in compression wraps. He [name], [name] ([id number]) [id number] Page 4 of 20 has a security card and [employer] however he is retiring on November 15. We will need to then get him into a better offloading boot for the left foot perhaps a  total contact cast 11/10; edema control is quite good. Left anterior and left medial lower leg wounds in the setting of chronic venous insufficiency and lymphedema. He also has a substantial area over the left plantar first metatarsal head. 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 He has significant hypergranulation on the left plantar foot wound. The patient is a diabetic but he does not have significant PAD 11/17; edema control is quite good. Left anterior and left medial lower leg wounds look better. The really concerning area remains the area on the left plantar first metatarsal head. He has a rim of epithelialization. 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 Starting today with a forefoot off loader but . possibly a total contact cast. 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 Arrives with a very significant odor from I think all of the wounds on the left leg and the left foot. Because of this I did not put a total contact cast on him today but will could still consider this. His wife was having cataract surgery which is the reason he missed the appointment 12/6. I saw this man 5 days ago with a swelling below the popliteal fossa. I thought he actually might have a Baker's cyst however the DVT rule out study that we could arrange right away was negative the technician told me this was not a ruptured Baker's cyst. We attempted to get this aspirated by under ultrasound guidance in interventional radiology however all they did was an ultrasound however it shows an extensive fluid collection 62 x 8 x 9.4 in the left thigh and left calf. The patient states he thinks this started 8 days  ago or so but he really is not complaining of any  pain, fever or systemic symptoms. He has not ha 12/20; after some difficulty I managed to get the patient into see Dr. Donzetta Matters. Eventually he was taken into the hospital and had a drain put in the fluid collection below his left knee posteriorly extending into the posterior thigh. He still has the drain in place. Culture of this showed moderate staff aureus few Morganella and few Klebsiella he is now on doxycycline and ciprofloxacin as suggested by infectious disease he is on this for a month. The drain will remain in place until it stops draining 12/29; he comes in today with the 1 wound on his left leg and the area on the left plantar first met head significantly smaller. Both look healthy. He still has the drain in the left leg. He says he has to change this daily. Follows up with Dr. Donzetta Matters on January 11. 06/29/2021; the wounds that I am following on the left leg and left first met head continued to be quite healthy. However the area where his inferior drain is in place had copious amounts of drainage which was green in color. The wound here is larger. Follows up with Dr. Gwenlyn Saran of vein and vascular his surgeon next week as well as infectious disease. He remains on ciprofloxacin and doxycycline. He is not complaining of excessive pain in either one of the drain areas 1/12; the patient saw vascular surgery and infectious disease. Vascular surgery has left the drain in place as there was still some notable drainage still see him back in 2 weeks. Dr. Velna Ochs stop the doxycycline and ciprofloxacin and I do not believe he follows up with them at this point. Culture I did last week showed both doxycycline resistant MRSA and Pseudomonas not sensitive to ciprofloxacin although only in rare titers 1/19; the patient's wound on the left anterior lower leg is just about healed. We have continued healing of the area that was medially on the left leg. Left first plantar  metatarsal head continues to get smaller. The major problem here is his 2 drain sites 1 on the left upper calf and lateral thigh. There is purulent drainage still from the left lateral thigh. I gave him antibiotics last week but we still have recultured. He has the drain in the area I think this is eventually going to have to come out. I suspect there will be a connecting wound to heal here perhaps with improved VAc 1/26; the patient had his drain removed by vein and vascular on 1/25/. This was a large pocket of fluid in his left thigh that seem to tunnel into his left upper calf. 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 His mention his Penrose drain was removed today. He now has a tunneling wound on his left calf and left thigh. Both of these probe widely towards each other although I cannot really prove that they connect. Both wounds on his lower leg anteriorly are closed and his area over the first metatarsal head on his right foot continues to improve. We are using Hydrofera Blue here. He also saw infectious disease culture of the abscess they noted was polymicrobial with MRSA, Morganella and Klebsiella he was treated with doxycycline and ciprofloxacin for 4 weeks ending on 07/03/2021. They did not recommend any further antibiotics. Notable that while he still had the Penrose drain in place last week he had purulent drainage coming out of the inferior IandD site this grew [facility], MRSA and Pseudomonas but there does not  appear to be any active infection in this area today with the drain out and he is not systemically unwell 2/2; with regards to the drain sites the superior one on the thigh actually is closed down the one on the upper left lateral calf measures about 8 and half centimeters which is an improvement seems to be less prominent although still with a lot of drainage. The only remaining wound is over the first metatarsal head on the left foot and this looks to be  continuing to improve with Hydrofera Blue. 2/9; the area on his plantar left foot continues to contract. Callus around the wound edge. The drain sites specifically have not come down in depth. We put the wound VAC on Monday he changed the canister late last night our intake nurse reported a pocket of fluid perhaps caused by our compression wraps 2/16; continued improvement in left foot plantar wound. drainage site in the calf is not improved in terms of depth (wound vac) 2/23; continued improvement in the left foot wound over the first metatarsal head. With regards to the drain sites the area on his thigh laterally is healed however the open area on his calf is small in terms of circumference by still probes in by about 15 cm. Within using the wound VAC. Hydrofera Blue on his foot 08/24/2021: The left first metatarsal head wound continues to improve. The wound bed is healthy with just some surrounding callus. Unfortunately the open drain site on his calf remains open and tunnels at least 15 cm (the extent of a Q-tip). This is despite several weeks of wound VAC treatment. Based on reading back through the notes, there has been really no significant change in the depth of the wound, although the orifice is smaller and the more cranial wound on his thigh has closed. I suspect the tunnel tracks nearly all the way to this location. 08/31/2021: Continued improvement in the left first metatarsal head wound. There has been absolutely no improvement to the long tunnel from his open drain site on his calf. We have tried to get him into see vascular surgery sooner to consider the possibility of simply filleting the tract open and allowing it to heal from the bottom up, likely with a wound VAC. They have not yet scheduled a sooner appointment than his current mid April 09/14/2021: He was seen by vascular surgery and they took him to the operating room last week. They opened a portion of the tunnel, but did not extend  the entire length of the known open subcutaneous tract. I read Dr. Claretha Cooper operative note and it is not clear from that documentation why only a portion of the tract was opened. The heaped up granulation tissue was curetted and removed from at least some portion of the tract. They did place a wound VAC and applied an Unna boot to the leg. The ulcer on his left first metatarsal head is smaller today. The bed looks good and there is just a small amount of surrounding callus. 09/21/2021: The ulcer on his left first metatarsal head looks to be stalled. There is some callus surrounding the wound but the wound bed itself does not appear particularly dynamic. The tunnel tract on his lateral left leg seems to be roughly the same length or perhaps slightly smaller but the wound bed appears healthy with good granulation tissue. He opened up a new wound on his medial thigh and the site of a prior surgical incision. He says that he did this  unconsciously in his sleep by scratching. 09/28/2021: Unfortunately, the ulcer on his left first metatarsal head has extended underneath the callus toward the dorsum of his foot. The medial thigh wounds are roughly the same. The tunnel on his lateral left leg continues to be problematic; it is longer than we are able to actually probe with a Q-tip. I am still not certain as to why Dr. Donzetta Matters did not open this up entirely when he took the patient to the operating room. We will likely be back in the same situation with just a small superficial opening in a long unhealed tract, as the open portion is granulating in nicely. [name], [name] ([id number]) [id number] Page 5 of 20 10/02/2021: The patient was initially scheduled for a nurse visit, but we are also applying a total contact cast today. The plantar foot wound looks clean without significant accumulated callus. We have been applying Prisma silver collagen to the site. 10/05/2021: The patient is here for  his first total contact cast change. We have tried using gauze packing strips in the tunnel on his lateral leg wound, but this does not seem to be working any better than the white VAC foam. The foot ulcer looks about the same with minimal periwound callus. Medial thigh wound is clean with just some overlying eschar. 10/12/2021: The plantar foot wound is stable without any significant accumulation of periwound callus. The surface is viable with good granulation tissue. The medial thigh wounds are much smaller and are epithelializing. On the other hand, he had purulent drainage coming from the tunnel on his lateral leg. He does go back to see Dr. Donzetta Matters next week and is planning to ask him why the wound tunnel was not completely opened at the time of his most recent operation. 10/19/2021: The plantar foot wound is markedly improved and has epithelial tissue coming through the surface. The medial thigh wounds are nearly closed with just a tiny open area. He did see Dr. Donzetta Matters earlier this week and apparently they did discuss the possibility of opening the sinus tract further and enabling a wound VAC application. Apparently there are some limits as to what Dr. 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 I think if we could get the tract open to the level of the popliteal fossa, this would greatly aid in her ability to get this chart closed. That being said, however, today when I probed the tract with a Q-tip, I was not able to insert the entirety of the Q-tip as I have on previous occasions. The tunnel is shorter by about 4 cm. The surface is clean with good granulation tissue and no further episodes of purulent drainage. 10/30/2021: Last week, the patient underwent surgery and had the long tract in his leg opened. There was a rind that was debrided, according to the operative report. His medial thigh ulcers are closed. The plantar foot wound is clean with a good surface and  some built up surrounding callus. 11/06/2021: The overall dimensions of the large wound on his lateral leg remain about the same, but there is good granulation tissue present and the tunneling is a little bit shorter. He has a new wound on his anterior tibial surface, in the same location where he had a similar lesion in the past. The plantar foot wound is clean with some buildup surrounding callus. Just toward the medial aspect of his foot, however, there is an area of darkening that once debrided, revealed another opening in  the skin surface. 11/13/2021: The anterior tibial surface wound is closed. The plantar foot wound has some surrounding callus buildup. The area of darkening that I debrided last week and revealed an opening in the skin surface has closed again. The tunnel in the large wound on his lateral leg has come in by about 3 cm. There is healthy granulation tissue on the entire wound surface. 11/23/2021: The patient was out of town last week and did wet-to-dry dressings on his large wound. 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 Unfortunately, he picked open the wound on his left medial thigh. He says that it was itching and he just could not stop scratching it until it was open again. The wound on his plantar foot is smaller and has not accumulated a tremendous amount of callus. The lateral leg wound is shallower and the tunnel has also decreased in depth. There is just a little bit of slough accumulation on the surface. 11/30/2021: Another portion of his left medial thigh has been opened up. All of these wounds are fairly superficial with just a little bit of slough and eschar accumulation. The wound on his plantar foot is almost closed with just a bit of eschar and periwound callus accumulation. The lateral leg wound is nearly flush with the surrounding skin and the tunnel is markedly shallower. 12/07/2021: There is just 1  open area on his left medial thigh. It is clean with just a little bit of perimeter eschar. The wound on his plantar foot continues to contract and just has some eschar and periwound callus accumulation. The lateral leg wound is closing at the more distal aspect and the tunnel is smaller. The surface is nearly flush with the surrounding skin and it has a good bed of granulation tissue. 12/14/2021: The thigh and foot wounds are closed. The lateral leg wound has closed over approximately half of its length. The tunnel continues to contract and the surface is now flush with the surrounding skin. The wound bed has robust granulation tissue. 12/22/2021: The thigh and foot wounds have reopened. The foot wound has a lot of callus accumulation around and over it. The thigh wound is tiny with just a little bit of slough in the wound bed. The lateral leg wound continues to contract. His vascular surgeon took the wound VAC off earlier in the week and the patient has been doing wet-to-dry dressings. There is a little slough accumulation on the surface. The tunnel is about 3 cm in depth at this point. 12/28/2021: The thigh wound is closed again. The foot wound has some callus that subsequently has peeled back exposing just a small slit of a wound. The lateral leg wound Is down to about half the size that it originally was and the tunnel is down to about half a centimeter in depth. 01/04/2022: The thigh wound remains closed. The foot wound has heavy callus overlying the wound site. Once this was debrided, the wound was found to be closed. The lateral leg wound is smaller again this week and very superficial. No tunnel could be identified. 01/12/2022: The thigh and foot wounds both remain closed. The lateral leg wound is now nearly flush with the skin surface. There is good granulation tissue present with a light layer of slough. 01/19/2022: Due to the way his wrap was placed, the patient did not change the dressing on his  thigh at all and so the foam was  saturated and his skin is macerated. There is a light layer of slough on the wound surface. The underlying granulation tissue is robust and healthy-appearing. He has heavy callus buildup at the site of his first metatarsal head wound which is still healed. 02/01/2022: He has been in silver alginate. When he removed the dressing from his thigh wound, however, some leg, superficially reopening a portion of the wound that had healed. In addition, underneath the callus at his left first metatarsal head, there appears to be a blister and the wound appears to be open again. 02/08/2022: The lateral leg wound has contracted substantially. There is eschar and a light layer of slough present. He says that it is starting to pull and is uncomfortable. On inspection, there is some puckering of the scar and the eschar is quite dry; this may account for his symptoms. On his first metatarsal head, the wound is much smaller with just some eschar on the surface. The callus has not reaccumulated. He reports that he had a blister come up on his medial thigh wound at the distal aspect. It popped and there is now an opening in his skin again. Looking back through his [place] of wound photos, there is what looks like a permanent suture just deep to this location and it may be trying to erode through. We have been using silver alginate on his wounds. 02/15/2022: The lateral leg wound is about half the size it was last week. It is clean with just a little perimeter eschar and light slough. The wound on his first metatarsal head is about the same with heavy callus overlying it. The medial thigh wound is closed again. He does have some skin changes on the top of his foot that looks potentially yeast related. 02/22/2022: The skin on the top of his foot improved with the use of a topical antifungal. The lateral leg wound continues to contract and is again smaller this week. There is a little bit of  slough and eschar on the surface. The first metatarsal head wound is a little bit smaller but has reaccumulated a thick callus over the top. He decided to try to trim his toenail and ultimately took the entire nail off of his left great toe. 03/02/2022: His lateral leg wound continues to improve, as does the wound on his left great toe. Unfortunately, it appears that somehow his foot got wet and moisture seeped in through the opening causing his skin to lift. There is a large wound now overlying his first metatarsal on both the plantar, medial, and dorsal portion of his foot. There is necrotic tissue and slough present underneath the shaggy macerated skin. 03/08/2022: The lateral leg wound is smaller again today. There is just a light layer of slough and eschar on the surface. The great toe wound is smaller again today. The first metatarsal wound is a little bit smaller today and does not look nearly as necrotic and macerated. There is still slough and nonviable tissue present. 03/15/2022: The lateral leg wound is narrower and just has a little bit of light slough buildup. The first metatarsal wound still has a fair amount of moisture affecting the periwound skin. The great toe wound is healed. 03/22/2022: The lateral leg wound is now isolated to just at the level of his knee. There is some eschar and slough accumulation. The first metatarsal head MCADOO, MUZQUIZ ([id number]) [id number] Page 6 of 20 wound has epithelialized tremendously and is about half the size that it was  last week. He still has some maceration on the top of his foot and a fungal odor is present. 03/29/2022: T oday the patient's foot was macerated, suggesting that the cast got wet. The patient has also been picking at his dry skin and has enlarged the wound on his left lateral leg. In the time between having his cast removed and my evaluation, he had picked more dry skin and opened up additional wounds on his  Achilles area and dorsal foot. The plantar first metatarsal head wound, however, is smaller and clean with just macerated callus around the perimeter and light slough on the surface. The lateral leg wound measured a little bit larger but is also fairly clean with eschar and minimal slough. 04/02/2022: The patient had vascular studies done last Friday and so his cast was not applied. He is here today to have that done. 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 04/05/2022: Both wounds are smaller and quite clean. There is just a little biofilm on the lateral leg wound. 10/20; the patient has a wound on the left lateral surgical incision at the level of his lateral knee this looks clean and improved. He is using silver alginate. He also has an area on his left medial foot for which she is using Hydrofera Blue under a total contact cast both wounds are measuring smaller 04/20/2022: The plantar foot wound has contracted considerably and is very close to closing. The lateral leg wound was measured a little larger, but there was a tiny open area that was included in the measurements that was not included last week. He has some eschar around the perimeter but otherwise the wound looks clean. 04/27/2022: The lateral leg wound looks better this week. He says that midweek, he felt it was very dry and began applying hydrogel to the site. I think this was beneficial. The foot wound is nearly closed underneath a thick layer of dry skin and callus. 05/04/2022: The foot wound is healed. He has developed a new small ulcer on his anterior tibial surface about midway up his leg. It has a little slough on the surface. The lateral leg wound still is fairly dry, but clean with just a little biofilm on the surface. 05/11/2022: The wound on his foot reopened on Wednesday. A large blister formed which then broke open revealing the fat layer underneath. The ulcer on his anterior tibial surface is a little bit larger  this week. The lateral leg wound has much better moisture balance this week. Fortunately, prior to his foot wound reopening, he did get the cast made for his orthotic. 05/15/2022: Already, the left medial foot wound has improved. The tissue is less macerated and the surface is clean. The ulcer on his anterior tibial surface continues to enlarge. This seems likely secondary to accumulated moisture. The lateral leg wound continues to have an improved moisture balance with the use of collagen. 05/25/2022: The medial foot wound continues to contract. It is now substantially smaller with just a little slough on the surface. The anterior tibial surface wound continues to enlarge further. Once again, this seems to be secondary to moisture. The lateral leg wound does not seem to be changing much in size, but the moisture balance is better. 06/01/2022: The anterior tibial wound is closed. The medial foot wound is down to just a very small, couple of millimeters, opening. The lateral leg wound has good moisture balance, but remains unchanged in size. 12/15; the patient's anterior tibial wound has  reopened, however the area on his right first metatarsal head is closed. The major wound is actually on the superior part of his surgical wound in the left lateral thigh. Not a completely viable surface under illumination. This may at some point require a debridement I think he is currently using Prisma. As noted the left medial foot wound has closed 06/14/2022: The anterior tibial wound has closed. The lateral leg wound has a better surface but is basically unchanged in size. The left medial foot wound has reopened. It looks as though there was some callus accumulation and moisture got under the callus which caused the tissue to break down again. 06/21/2022: A new wound has opened up just distal to the previous anterior tibial wound. It is small but has hypertrophic granulation tissue present. The lateral leg wound is  a little bit narrower and has a layer of slough on the surface. The left medial foot wound is down to just a pinhole. His custom orthotics should be available next week. Electronic Signature(s) Signed: 06/21/2022 8:29:04 AM By: Fredirick Maudlin MD FACS Previous Signature: 06/21/2022 8:23:45 AM Version By: Fredirick Maudlin MD FACS Entered By: Fredirick Maudlin on 06/21/2022 08:29:04 -------------------------------------------------------------------------------- Chemical Cauterization Details Patient Name: Date of Service: [name]. 06/21/2022 7:45 A M Medical Record Number: [id number] Patient Account Number: [id number] Date of Birth/Sex: Treating RN: [birth year] (71 y.o. [name] Primary Care Provider: [name] Other Clinician: Referring Provider: Treating Provider/Extender: [name] in Treatment: 25 Procedure Performed for: Wound #30 Left,Anterior Lower Leg Performed By: Physician Fredirick Maudlin, MD Post Procedure Diagnosis Same as Pre-procedure Notes scribed for Dr. Celine Ahr by Adline Peals, RN [name] ([id number]) [phone].pdf Page 7 of 20 Electronic Signature(s) Signed: 06/21/2022 10:27:26 AM By: Fredirick Maudlin MD FACS Signed: 06/21/2022 4:31:58 PM By: Sabas Sous By: Adline Peals on 06/21/2022 08:15:45 -------------------------------------------------------------------------------- Physical Exam Details Patient Name: Date of Service: [name]. 06/21/2022 7:45 A M Medical Record Number: [id number] Patient Account Number: [id number] Date of Birth/Sex: Treating RN: 1950/12/15 (71 y.o. M) Primary Care Provider: [name] Other Clinician: Referring Provider: Treating Provider/Extender: [name] in Treatment: 26 Constitutional He is hypertensive, but asymptomatic.. . . . No acute distress. Respiratory Normal work of breathing on room  air. Notes 06/21/2022: A new wound has opened up just distal to the previous anterior tibial wound. It is small but has hypertrophic granulation tissue present. The lateral leg wound is a little bit narrower and has a layer of slough on the surface. The left medial foot wound is down to just a pinhole. Electronic Signature(s) Signed: 06/21/2022 8:29:50 AM By: Fredirick Maudlin MD FACS Entered By: Fredirick Maudlin on 06/21/2022 08:29:50 -------------------------------------------------------------------------------- Physician Orders Details Patient Name: Date of Service: [name]. 06/21/2022 7:45 A M Medical Record Number: [id number] Patient Account Number: [id number] Date of Birth/Sex: Treating RN: 1951-01-12 (71 y.o. [name] Primary Care Provider: [name] Other Clinician: Referring Provider: Treating Provider/Extender: [name] in Treatment: 14 Verbal / Phone Orders: No Diagnosis Coding ICD-10 Coding Code Description 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.828 Non-pressure chronic ulcer of other part of left lower leg with other specified severity E11.621 Type 2 diabetes mellitus with foot ulcer 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 I89.0 Lymphedema, not elsewhere classified I87.322 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 Follow-up Appointments ppointment  in 1 week. - Dr Celine Ahr - Room 1 Return A Other: - [facility] next week 06/13/22 Anesthetic Wound #22 Left,Proximal,Lateral Lower Leg (In clinic) Topical Lidocaine 4% applied to wound bed RHEA, THRUN ([id number]) [id number] Page 8 of 20 Bathing/ Shower/ Hygiene May shower with protection but do not get wound dressing(s) wet. Protect dressing(s) with water repellant cover (for example, large  plastic bag) or a cast cover and may then take shower. Edema Control - Lymphedema / SCD / Other Avoid standing for long periods of time. Patient to wear own compression stockings every day. - on right leg; Moisturize legs daily. Compression stocking or Garment 20-30 mm/Hg pressure to: - left leg daily Off-Loading Total Contact Cast to Left Lower Extremity Other: - minimal weight bearing left foot Wound Treatment Wound #18 - Metatarsal head first Wound Laterality: Plantar, Left Cleanser: Soap and Water 1 x Per Week/30 Days Discharge Instructions: May shower and wash wound with dial antibacterial soap and water prior to dressing change. Cleanser: Wound Cleanser 1 x Per Week/30 Days Discharge Instructions: Cleanse the wound with wound cleanser prior to applying a clean dressing using gauze sponges, not tissue or cotton balls. Peri-Wound Care: Sween Lotion (Moisturizing lotion) 1 x Per Week/30 Days Discharge Instructions: Apply moisturizing lotion to the leg Prim Dressing: Sorbalgon AG Dressing, 4x4 (in/in) 1 x Per Week/30 Days ary Discharge Instructions: Apply to wound bed as instructed Secondary Dressing: Optifoam Non-Adhesive Dressing, 4x4 in 1 x Per Week/30 Days Discharge Instructions: Apply over primary dressing as directed. Secondary Dressing: Zetuvit Plus 4x8 in 1 x Per Week/30 Days Discharge Instructions: Apply over primary dressing as directed. Secured With: [employer], 4.5x3.1 (in/yd) 1 x Per Week/30 Days Discharge Instructions: Secure with Kerlix as directed. Secured With: 61M Medipore H Soft Cloth Surgical T ape, 4 x 10 (in/yd) 1 x Per Week/30 Days Discharge Instructions: Secure with tape as directed. Wound #22 - Lower Leg Wound Laterality: Left, Lateral, Proximal Cleanser: Soap and Water 3 x Per Week/30 Days Discharge Instructions: May shower and wash wound with dial antibacterial soap and water prior to dressing change. Cleanser: Wound Cleanser 3 x Per Week/30  Days Discharge Instructions: Cleanse the wound with wound cleanser prior to applying a clean dressing using gauze sponges, not tissue or cotton balls. Peri-Wound Care: Sween Lotion (Moisturizing lotion) 3 x Per Week/30 Days Discharge Instructions: Apply moisturizing lotion to the leg Topical: Skintegrity Hydrogel 4 (oz) 3 x Per Week/30 Days Discharge Instructions: Apply hydrogel as directed Prim Dressing: Endoform 2x2 in 3 x Per Week/30 Days ary Discharge Instructions: Moisten with Hydrogel or saline Secondary Dressing: ABD Pad, 8x10 (Generic) 3 x Per Week/30 Days Discharge Instructions: Apply over primary dressing as directed. Secured With: Elastic Bandage 4 inch (ACE bandage) 3 x Per Week/30 Days Discharge Instructions: Secure with ACE bandage as directed. Wound #30 - Lower Leg Wound Laterality: Left, Anterior Cleanser: Soap and Water 1 x Per Week/30 Days Discharge Instructions: May shower and wash wound with dial antibacterial soap and water prior to dressing change. Cleanser: Wound Cleanser 1 x Per Week/30 Days Discharge Instructions: Cleanse the wound with wound cleanser prior to applying a clean dressing using gauze sponges, not tissue or cotton balls. Prim Dressing: Sorbalgon AG Dressing 2x2 (in/in) 1 x Per Week/30 Days ary Discharge Instructions: Apply to wound bed as instructed Secondary Dressing: Zetuvit Plus Silicone Border Dressing 4x4 (in/in) 1 x Per Week/30 Days Discharge Instructions: Apply silicone border over primary dressing as directed. KHIAN, REMO ([id number])  [phone].pdf Page 9 of 20 Patient Medications llergies: No Known Drug Allergies A Notifications Medication Indication Start End 06/21/2022 lidocaine DOSE topical 4 % cream - cream topical Electronic Signature(s) Signed: 06/21/2022 10:27:26 AM By: Fredirick Maudlin MD FACS Entered By: Fredirick Maudlin on 06/21/2022  08:30:05 -------------------------------------------------------------------------------- Problem List Details Patient Name: Date of Service: [name]. 06/21/2022 7:45 A M Medical Record Number: [id number] Patient Account Number: [id number] Date of Birth/Sex: Treating RN: 03/17/1951 (71 y.o. M) Primary Care Provider: [name] Other Clinician: Referring Provider: Treating Provider/Extender: [name] in Treatment: 62 Active Problems ICD-10 Encounter Code Description Active Date MDM Diagnosis L97.528 Non-pressure chronic ulcer of other part of left foot with other specified 04/12/2021 No Yes severity L97.828 Non-pressure chronic ulcer of other part of left lower leg with other specified 04/12/2021 No Yes severity E11.621 Type 2 diabetes mellitus with foot ulcer 04/12/2021 No Yes 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 04/12/2021 No Yes I89.0 Lymphedema, not elsewhere classified 04/12/2021 No Yes I87.322 Chronic venous hypertension (idiopathic) with inflammation of left lower 04/12/2021 No Yes extremity Inactive Problems ICD-10 Code Description Active Date Inactive Date L97.828 Non-pressure chronic ulcer of other part of left lower leg with other specified severity 06/08/2022 06/08/2022 E11.42 Type 2 diabetes mellitus with diabetic polyneuropathy 04/12/2021 04/12/2021 L02.416 Cutaneous abscess of left lower limb 06/13/2021 06/13/2021 [name] ([id number]) [phone].pdf Page 10 of 20 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 07/20/2021 07/20/2021 Resolved Problems Electronic Signature(s) Signed: 06/21/2022 8:22:44 AM By: Fredirick Maudlin MD FACS Entered By: Fredirick Maudlin on 06/21/2022 08:22:43 -------------------------------------------------------------------------------- Progress Note Details Patient Name: Date of Service: [name]. 06/21/2022 7:45 A M Medical Record Number: [id number] Patient Account Number: [id number] Date of Birth/Sex: Treating RN: [birth year] (71 y.o. M) Primary Care Provider: [name] Other Clinician: Referring Provider: Treating Provider/Extender: [name] in Treatment: 35 Subjective Chief Complaint Information obtained from Patient Left leg and foot ulcers 04/12/2021; patient is here for wounds on his left lower leg and left plantar foot over the first metatarsal head History of Present Illness (HPI) 10/11/17; Mr. Buckles is a 71 year old man who tells me that in 2015 he slipped down the latter traumatizing his left leg. He developed a wound in the same spot the area that we are currently looking at. He states this closed over for the most part although he always felt it was somewhat unstable. In 2016 he hit the same area with the door of his car had this reopened. He tells me that this is never really closed although sometimes an inflow it remains open on a constant basis. He has not been using any specific dressing to this except for topical antibiotics the nature of which were not really sure. His primary doctor did send him to see Dr. [name] of interventional cardiology. He underwent an angiogram on 08/06/17 and he underwent a PTA and directional atherectomy of the lesser distal SFA and popliteal arteries which resulted in brisk improvement in blood flow. It was noted that he had 2 vessel runoff through the anterior tibial and peroneal. He is also been to see vascular and interventional radiologist. He was not felt to have any significant superficial venous insufficiency. Presumably is not a candidate for any ablation. It was suggested he come here for wound care. The patient is a type II diabetic on insulin. He also has a history of venous insufficiency. ABIs on the left  were noncompressible in our clinic 10/21/17; patient we admitted to the clinic last week.  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 We put Iodosorb on him after an aggressive debridement and 3 layer compression. He complained of pain in his ankle and itching with is skin in fact he scratched the area on the medial calf superiorly at the rim of our wraps and he has 2 small open areas in that location today which are new. I changed his primary dressing today to silver collagen. As noted he is already had revascularization and does not have any significant superficial venous insufficiency that would be amenable to ablation 10/28/17; patient admitted to the clinic 2 weeks ago. He has a smaller Wound. Scratch injury from last week revealed. There is large wound over the tibial area. This is smaller. Granulation looks healthy. No need for debridement. 11/04/17; the wound on the left lateral calf looks better. Improved dimensions. Surface of this looks better. We've been maintaining him and Kerlix Coban wraps. He finds this much more comfortable. Silver collagen dressing 11/11/17; left lateral Wound continues to look healthy be making progress. Using a #5 curet I removed removed nonviable skin from the surface of the wound and then necrotic debris from the wound surface. Surface of the wound continues to look healthy. ooHe also has an open area on the left great toenail bed. We've been using topical antibiotics. 11/19/17; left anterior lateral wound continues to look healthy but it's not closed. ooHe also had a small wound above this on the left leg ooInitially traumatic wounds in the setting of significant chronic venous insufficiency and stasis dermatitis 11/25/17; left anterior wounds superiorly is closed still a small wound inferiorly. 12/02/17; left anterior tibial area. Arrives today with adherent callus. Post debridement clearly not completely closed. Hydrofera Blue under 3 layer compression. 12/09/17; left anterior tibia.  Circumferential eschar however the wound bed looks stable to improved. We've been using Hydrofera Blue under 3 layer compression 12/17/17; left anterior tibia. Apparently this was felt to be closed however when the wrap was taken off there is a skin tear to reopen wounds in the same area we've been using Hydrofera Blue under 3 layer compression 12/23/17 left anterior tibia. Not close to close this week apparently the [facility] was stuck to this again. Still circumferential eschar requiring debridement. I put a contact layer on this this time under the Hydrofera Blue 12/31/17; left anterior tibia. Wound is better slight amount of hyper-granulation. Using Hydrofera Blue over Adaptic. 01/07/18; left anterior tibia. The wound had some surface eschar however after this was removed he has no open wound.he was already revascularized by Dr. [name] when he came to our clinic with atherectomy of the left SFA and popliteal artery. He was also sent to interventional radiology for venous reflux studies. He was not felt to have significant reflux but certainly has chronic venous changes of his skin with hemosiderin deposition around this area. He will definitely need to lubricate his skin and wear compression stocking and I've talked to him about this. READMISSION 05/26/2018 [name] ([id number]) [id number] Page 11 of 20 This is a now 71 year old man we cared for with traumatic wounds on his left anterior lower extremity. He had been previously revascularized during that admission by Dr. [name]. Apparently in follow-up Dr. [name] noted that he had deterioration in his arterial status. He underwent a stent placement in the distal left SFA on  04/22/2018. 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 He went back to the angiography suite on 04/30/2018 he underwent PTA and balloon angioplasty of the occluded left mid anterior tibial artery, thrombotic occlusion went from 100 to  0% which reconstitutes the posterior tibial artery. He had thrombectomy and aspiration of the peroneal artery. The stent placed in the distal SFA left SFA was still occluded. 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 He has been recuperating at [place] home in [facility] after the most recent hospitalization. He is going home tomorrow. He tells me that 2 weeks ago he traumatized the tip of his left fifth toe. He came in urgently for our review of this. This was a history of before I noted that Dr. [name] had already noted dry gangrenous changes of the left fifth toe 06/09/2018; 2-week follow-up. I did contact Dr. [name] after his last appointment and he apparently saw 1 of Dr. Irven Shelling colleagues the next day. He does not follow-up with Dr. [name] himself until Thursday of this week. 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 Nevertheless there is no evidence of infection no drainage and no pain. He had a new area that this week when we were signing him in today on the left anterior mid tibia area, this is in close proximity to the previous wound we have dealt with in this clinic. 06/23/2018; 2-week follow-up. I did not receive a recent note from Dr. [name] to review today. Our office is trying to obtain this. He is apparently not planning to do further vascular interventions and wondered about compression to try and help with the patient's chronic venous insufficiency. However we are also concerned about the arterial flow. ooHe arrives in clinic today with a new area on the left third toe. The areas on the calf/anterior tibia are close to closing. The left fifth toe is still mummified using Betadine. -In reviewing things with  the patient he has what sounds like claudication with mild to moderate amount of activity. 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 I prescribed Levaquin over the phone while we attempted to arrange a plan of care. However the patient called yesterday to report he had low-grade fever and he came in today acutely. There is been a marked deterioration in the left third toe with spreading cellulitis up into the dorsal left foot. He was referred to the emergency room. Readmission: 06/29/2020 patient presents today for reevaluation here in our clinic he was previously treated by Dr. Dellia Nims at the latter part of 2019 in 2 the beginning of 2020. Subsequently we have not seen him since that time in the interim he did have evaluation with vein and vascular specialist specifically Dr. Anice Paganini who did perform quite extensive work for a left femoral to anterior tibial artery bypass. With that being said in the interim the patient has developed significant lymphedema and has wounds that he tells me have really never healed in regard to the incision site on the left leg. He also has multiple wounds on the feet for various reasons some of which is that he tends to pick at his feet. Fortunately there is no signs of  active infection systemically at this time he does have some wounds that are little bit deeper but most are fairly superficial he seems to have good blood flow and overall everything appears to be healthy I see no bone exposed and no obvious signs of osteomyelitis. I do not know that he necessarily needs a x-ray at this point although that something we could consider depending on how things progress. The patient does have a history of lymphedema, diabetes, this is type II, chronic kidney disease stage III, hypertension, and history of peripheral vascular disease. 07/05/2020; patient admitted last week. Is a patient I remember from 2019 he had a spreading infection  involving the left foot and we sent him to the hospital. 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 He subsequently had a left femoral to anterior tibial bypass by Dr.Cain vein and vascular. He also has severe lymphedema with chronic skin changes related to that on the left leg. The most problematic area that was new today was on the left medial great toe. This was apparently a small area last week there was purulent drainage which our intake nurse cultured. Also areas on the left medial foot and heel left lateral foot. He has 2 areas on the left medial calf left lateral calf in the setting of the severe lymphedema. 07/13/2020 on evaluation today patient appears to be doing better in my opinion compared to his last visit. The good news is there is no signs of active infection systemically and locally I do not see any signs of infection either. He did have an x-ray which was negative that is great news he had a culture which showed MRSA but at the same time he is been on the doxycycline which has helped. I do think we may want to extend this for 7 additional days 1/25; patient admitted to the clinic a few weeks ago. 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 We have been putting him under compression his edema control is a lot better but he is severe verricused skin on the left leg. He is really done quite well he still has an open area on the left medial calf and the left medial first metatarsal head. We have been using silver collagen on the leg silver alginate on the foot 07/27/2020 upon evaluation today patient appears to be doing decently well in regard to his wounds. He still has a lot of dry skin on the left leg. Some of this is starting to peel back and I think he may be able to have them out by removing some that today. Fortunately there is no signs of active infection at this time on the left leg although on the  right leg he does appear to have swelling and erythema as well as some mild warmth to touch. This does have been concerned about the possibility of cellulitis although within the differential diagnosis I do think that potentially a DVT has to be at least considered. We need to rule that out before proceeding would just call in the cellulitis. Especially since he is having pain in the posterior aspect of his calf muscle. 2/8; the patient had seen sparingly. He has severe skin changes of chronic lymphedema in the left leg thickened hyperkeratotic verrucous skin. He has an open wound on the medial part of the left first met head left mid tibia. He also has a rim of nonepithelialized skin  in the anterior mid tibia. He brought in the AmLactin lotion that was been prescribed although I am not sure under compression and its utility. There concern about cellulitis on the right lower leg the last time he was here. He was put on on antibiotics. His DVT rule out was negative. The right leg looks fine he is using his stocking on this area 08/10/2020 upon evaluation today patient appears to be doing well with regard to his leg currently. He has been tolerating the dressing changes without complication. Fortunately there is no signs of active infection which is great news. Overall very pleased with where things stand. 2/22; the patient still has an area on the medial part of the left first met his head. This looks better than when I last saw this earlier this month he has a rim of epithelialization but still some surface debris. Mostly everything on the left leg is healed. There is still a vulnerable in the left mid tibia area. 08/30/2020 upon evaluation today patient appears to be doing much better in regard to his wounds on his foot. 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  Nonetheless I think we will address that today I Minna send in a prescription for him in that regard. Overall though there does not appear to be any signs of significant worsening. 09/07/2020 on evaluation today patient's wounds over his left foot appear to be doing excellent. I do not see any signs of infection there is some callus buildup this can require debridement for certain but overall I feel like he is managing quite nicely. He still using the AmLactin cream which has been beneficial for him as well. 3/22; left foot wound is closed. There is no open area here. He is using ammonium lactate lotion to the lower extremities to help exfoliate dry cracked skin. He has compression stockings from elastic therapy in [place]. The wound on the medial part of his left first met head is healed today. READMISSION 04/12/2021 Mr. Sutter is a patient we know fairly well he had a prolonged stay in clinic in 2019 with wounds on his left lateral and left anterior lower extremity in the setting of chronic venous insufficiency. More recently he was here earlier this year with predominantly an area on his left foot first metatarsal head plantar and he says the plantar foot broke down on its not long after we discharged him but he did not come back here. The last few months areas of broken down on his left anterior and again the left lateral lower extremity. The leg itself is very swollen chronically enlarged a lot of hyperkeratotic dry Berry Q skin in the left lower leg. His edema extends well into the thigh. He was seen by Dr. Donzetta Matters. He had ABIs on 03/02/2021 showing an ABI on the right of 1 with a TBI of 0.72 his ABI in the left at 1.09 TBI of 0.99. Monophasic and biphasic waveforms on the right. On the left monophasic waveforms were noted he went on to have an angiogram on 03/27/2021 this showed the aortic aortic and iliac segments were free of flow-limiting stenosis the left common femoral vein to evaluate the left  femoral to anterior tibial artery bypass was unobstructed the bypass was patent without any areas of stenosis. [name], [name] ([id number]) [id number] Page 12 of 20 We discharged the patient  in bilateral juxta lite stockings but very clearly that was not sufficient to control the swelling and maintain skin integrity. He is clearly going to need compression pumps. 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 This is fortunate because he is on his feet for long periods of time. 10/27; patient comes in with our intake nurse reporting copious amount of green drainage from the left anterior mid tibia the left dorsal foot and to a lesser extent the left medial mid tibia. We left the compression wrap on all week for the amount of edema in his left leg is quite a bit better. We use silver alginate as the primary dressing 11/3; edema control is good. Left anterior lower leg left medial lower leg and the plantar first metatarsal head. The left anterior lower leg required debridement. Deep tissue culture I did of this wound showed MRSA I put him on 10 days of doxycycline which she will start today. We have him in compression wraps. He has a security card and [employer] however he is retiring on November 15. We will need to then get him into a better offloading boot for the left foot perhaps a total contact cast 11/10; edema control is quite good. Left anterior and left medial lower leg wounds in the setting of chronic venous insufficiency and lymphedema. He also has a substantial area over the left plantar first metatarsal head. 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 He has significant hypergranulation on the left plantar foot wound. The patient is a diabetic but he does not have significant PAD 11/17; edema control is quite good. Left anterior  and left medial lower leg wounds look better. The really concerning area remains the area on the left plantar first metatarsal head. He has a rim of epithelialization. 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 Starting today with a forefoot off loader but . possibly a total contact cast. 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 Arrives with a very significant odor from I think all of the wounds on the left leg and the left foot. Because of this I did not put a total contact cast on him today but will could still consider this. His wife was having cataract surgery which is the reason he missed the appointment 12/6. I saw this man 5 days ago with a swelling below the popliteal fossa. I thought he actually might have a Baker's cyst however the DVT rule out study that we could arrange right away was negative the technician told me this was not a ruptured Baker's cyst. We attempted to get this aspirated by under ultrasound guidance in interventional radiology however all they did was an ultrasound however it shows an extensive fluid collection 62 x 8 x 9.4 in the left thigh and left calf. The patient states he thinks this started 8 days ago or so but he really is not complaining of any pain, fever or systemic symptoms. He has not ha 12/20; after some difficulty I managed to get the patient into see Dr. Donzetta Matters. Eventually he was  taken into the hospital and had a drain put in the fluid collection below his left knee posteriorly extending into the posterior thigh. He still has the drain in place. Culture of this showed moderate staff aureus few Morganella and few Klebsiella he is now on doxycycline and ciprofloxacin as suggested by infectious disease he is on this for a month. The drain  will remain in place until it stops draining 12/29; he comes in today with the 1 wound on his left leg and the area on the left plantar first met head significantly smaller. Both look healthy. He still has the drain in the left leg. He says he has to change this daily. Follows up with Dr. Donzetta Matters on January 11. 06/29/2021; the wounds that I am following on the left leg and left first met head continued to be quite healthy. However the area where his inferior drain is in place had copious amounts of drainage which was green in color. The wound here is larger.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

## 2022-06-26 ENCOUNTER — Encounter: Payer: Self-pay | Admitting: Nurse Practitioner

## 2022-06-28 ENCOUNTER — Encounter (HOSPITAL_BASED_OUTPATIENT_CLINIC_OR_DEPARTMENT_OTHER): Payer: Medicare Other | Attending: General Surgery | Admitting: General Surgery

## 2022-06-28 DIAGNOSIS — L97828 Non-pressure chronic ulcer of other part of left lower leg with other specified severity: Secondary | ICD-10-CM | POA: Insufficient documentation

## 2022-06-28 DIAGNOSIS — N183 Chronic kidney disease, stage 3 unspecified: Secondary | ICD-10-CM | POA: Diagnosis not present

## 2022-06-28 DIAGNOSIS — L97528 Non-pressure chronic ulcer of other part of left foot with other specified severity: Secondary | ICD-10-CM | POA: Diagnosis not present

## 2022-06-28 DIAGNOSIS — I129 Hypertensive chronic kidney disease with stage 1 through stage 4 chronic kidney disease, or unspecified chronic kidney disease: Secondary | ICD-10-CM | POA: Diagnosis not present

## 2022-06-28 DIAGNOSIS — I890 Lymphedema, not elsewhere classified: Secondary | ICD-10-CM | POA: Insufficient documentation

## 2022-06-28 DIAGNOSIS — I872 Venous insufficiency (chronic) (peripheral): Secondary | ICD-10-CM | POA: Insufficient documentation

## 2022-06-28 DIAGNOSIS — E1140 Type 2 diabetes mellitus with diabetic neuropathy, unspecified: Secondary | ICD-10-CM | POA: Insufficient documentation

## 2022-06-28 DIAGNOSIS — E1122 Type 2 diabetes mellitus with diabetic chronic kidney disease: Secondary | ICD-10-CM | POA: Diagnosis not present

## 2022-06-28 DIAGNOSIS — M1990 Unspecified osteoarthritis, unspecified site: Secondary | ICD-10-CM | POA: Insufficient documentation

## 2022-06-28 DIAGNOSIS — E11621 Type 2 diabetes mellitus with foot ulcer: Secondary | ICD-10-CM | POA: Insufficient documentation

## 2022-06-28 DIAGNOSIS — E1151 Type 2 diabetes mellitus with diabetic peripheral angiopathy without gangrene: Secondary | ICD-10-CM | POA: Diagnosis not present

## 2022-06-28 NOTE — Progress Notes (Signed)
[name], [name] ([id number]) [id number] Page 1 of 12 Visit Report for 06/28/2022 Arrival Information Details Patient Name: Date of Service: [name], [name] 06/28/2022 10:45 A M Medical Record Number: [id number] Patient Account Number: [id number] Date of Birth/Sex: Treating RN: 1950/09/05 (73 y.o. [name] Primary Care Kaylei Frink: [name] Other Clinician: Referring Neelam Tiggs: Treating Nikiah Goin/Extender: [name] in Treatment: 43 Visit Information History Since Last Visit Added or deleted any medications: No Patient Arrived: Ambulatory Any new allergies or adverse reactions: No Arrival Time: 10:55 Had a fall or experienced change in No Accompanied By: self activities of daily living that may affect Transfer Assistance: None risk of falls: Patient Identification Verified: Yes Signs or symptoms of abuse/neglect since last visito No Secondary Verification Process Completed: Yes Hospitalized since last visit: No Patient Requires Transmission-Based Precautions: No Implantable device outside of the clinic excluding No Patient Has Alerts: Yes cellular tissue based products placed in the center since last visit: Has Dressing in Place as Prescribed: Yes Has Footwear/Offloading in Place as Prescribed: Yes Left: T Contact Cast otal Pain Present Now: No Electronic Signature(s) Signed: 06/28/2022 2:29:22 PM By: Adline Peals Entered By: Adline Peals on 06/28/2022 10:56:13 -------------------------------------------------------------------------------- Compression Therapy Details Patient Name: Date of Service: [name] Garfinkel. 06/28/2022 10:45 A M Medical Record Number: [id number] Patient Account Number: [id number] Date of Birth/Sex: Treating RN: [birth year] (72 y.o. [name] Primary Care Leita Lindbloom: [name] Other Clinician: Referring Azriel Dancy: Treating Daune Divirgilio/Extender: [name] in Treatment: 59 Compression Therapy Performed for Wound Assessment: Wound #30 Left,Anterior Lower Leg Performed By: Clinician Adline Peals, RN Compression Type: Three Layer Post Procedure Diagnosis Same as Pre-procedure Electronic Signature(s) Signed: 06/28/2022 2:29:22 PM By: Adline Peals Entered By: Adline Peals on 06/28/2022 11:35:18 [name] ([id number]) [id number] Page 2 of 12 -------------------------------------------------------------------------------- Encounter Discharge Information Details Patient Name: Date of Service: MAVRICK, MCQUIGG 06/28/2022 10:45 A M Medical Record Number: [id number] Patient Account Number: [id number] Date of Birth/Sex: Treating RN: [birth year] (72 y.o. [name] Primary Care Elwin Tsou: [name] Other Clinician: Referring Symphony Demuro: Treating Tasheena Wambolt/Extender: [name] in Treatment: 46 Encounter Discharge Information Items Post Procedure Vitals Discharge Condition: Stable Temperature (F): 98.4 Ambulatory Status: Ambulatory Pulse (bpm): 73 Discharge Destination: Home Respiratory Rate (breaths/min): 18 Transportation: Private Auto Blood Pressure (mmHg): 136/86 Accompanied By: self Schedule Follow-up Appointment: Yes Clinical Summary of Care: Patient Declined Electronic Signature(s) Signed: 06/28/2022 2:29:22 PM By: Adline Peals Entered By: Adline Peals on 06/28/2022 11:36:07 -------------------------------------------------------------------------------- Lower Extremity Assessment Details Patient Name: Date of Service: [name] Garfinkel. 06/28/2022 10:45 A M Medical Record Number: [id number] Patient Account Number: [id number] Date of Birth/Sex: Treating RN: [birth year] (72 y.o. [name] Primary Care Brannan Cassedy: [name] Other Clinician: Referring Maurio Baize: Treating Willye Javier/Extender: [name] in  Treatment: 63 Edema Assessment Assessed: [name]: No] [name]: No] Edema: [Left: Ye] [Right: s] Calf Left: Right: Point of Measurement: 41 cm From Medial Instep 48.5 cm Ankle Left: Right: Point of Measurement: 10 cm From Medial Instep 29.5 cm Vascular Assessment Pulses: Dorsalis Pedis Palpable: [Left:Yes] Electronic Signature(s) Signed: 06/28/2022 2:29:22 PM By: Adline Peals Entered By: Adline Peals on 06/28/2022 11:06:54 [name] ([id number]) [id number] Page 3 of 12 -------------------------------------------------------------------------------- Multi Wound Chart Details Patient Name: Date of Service: [name], [name] 06/28/2022 10:45 A M Medical Record Number: [id number] Patient Account Number: [id number] Date of Birth/Sex: Treating RN: [birth year] (72 y.o. M) Primary Care Sharifa Bucholz: [name] Other  Clinician: Referring Luster Hechler: Treating Ragna Kramlich/Extender: [name] in Treatment: 63 Vital Signs Height(in): 74 Capillary Blood Glucose(mg/dl): 158 Weight(lbs): 238 Pulse(bpm): 33 Body Mass Index(BMI): 30.6 Blood Pressure(mmHg): 136/86 Temperature(F): 98.4 Respiratory Rate(breaths/min): 18 [18:Photos:] Left, Plantar Metatarsal head first Left, Proximal, Lateral Lower Leg Left, Anterior Lower Leg Wound Location: Gradually Appeared Bump Shear/Friction Wounding Event: 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 Primary Etiology: Extremity Glaucoma, Sleep Apnea, Glaucoma, Sleep Apnea, Glaucoma, Sleep Apnea, Comorbid History: Hypertension, Peripheral Arterial Hypertension, Peripheral Arterial Hypertension, Peripheral Arterial Disease, Peripheral Venous Disease, Disease, Peripheral Venous Disease, Disease, Peripheral Venous Disease, Type II Diabetes, Gout, Osteoarthritis, Type II Diabetes, Gout, Osteoarthritis, Type II Diabetes, Gout, Osteoarthritis, Neuropathy Neuropathy Neuropathy 08/23/2020 06/03/2021  06/21/2022 Date Acquired: 63 55 1 Weeks of Treatment: Open Open Open Wound Status: No No No Wound Recurrence: No Yes No Clustered Wound: N/A 3 N/A Clustered Quantity: 0x0x0 5.5x1.4x0.1 0.5x0.4x0.1 Measurements L x W x D (cm) 0 6.048 0.157 A (cm) : rea 0 0.605 0.016 Volume (cm) : 100.00% -266.80% 60.10% % Reduction in A rea: 100.00% 54.10% 59.00% % Reduction in Volume: Grade 2 Full Thickness With Exposed Support Full Thickness Without Exposed Classification: Structures Support Structures None Present Medium Medium Exudate A mount: N/A Serosanguineous Serous Exudate Type: N/A red, brown amber Exudate Color: Flat and Intact Fibrotic scar, thickened scar Distinct, outline attached Wound Margin: None Present (0%) Large (67-100%) Large (67-100%) Granulation A mount: N/A Red, Pink Red Granulation Quality: None Present (0%) Small (1-33%) Small (1-33%) Necrotic A mount: N/A Eschar, Adherent Slough Adherent Slough Necrotic Tissue: Fascia: No Fat Layer (Subcutaneous Tissue): Yes Fat Layer (Subcutaneous Tissue): Yes Exposed Structures: Fat Layer (Subcutaneous Tissue): No Fascia: No Fascia: No Tendon: No Tendon: No Tendon: No Muscle: No Muscle: No Muscle: No Joint: No Joint: No Joint: No Bone: No Bone: No Bone: No Large (67-100%) Medium (34-66%) Medium (34-66%) Epithelialization: N/A Debridement - Selective/Open Wound Debridement - Selective/Open Wound Debridement: Pre-procedure Verification/Time Out N/A 11:16 11:16 Taken: N/A Lidocaine 4% Topical Solution Lidocaine 4% Topical Solution Pain Control: N/A [facility] Tissue Debrided: N/A Non-Viable Tissue Non-Viable Tissue Level: N/A 7.7 0.2 Debridement A (sq cm): rea N/A Curette Curette Instrument: N/A Minimum Minimum Bleeding: N/A Pressure Pressure Hemostasis A chievedAlice Reichert ([id number]) [id number] Page 4 of 12 N/A Procedure was tolerated well Procedure was  tolerated well Debridement Treatment Response: N/A 5.5x1.4x0.1 0.5x0.4x0.1 Post Debridement Measurements L x W x D (cm) N/A 0.605 0.016 Post Debridement Volume: (cm) Callus: Yes Scarring: Yes No Abnormalities Noted Periwound Skin Texture: Dry/Scaly: Yes Dry/Scaly: Yes Maceration: Yes Periwound Skin Moisture: Maceration: No No Abnormalities Noted Hemosiderin Staining: Yes Hemosiderin Staining: Yes Periwound Skin Color: No Abnormality No Abnormality No Abnormality Temperature: N/A Yes N/A Tenderness on Palpation: N/A Debridement Debridement Procedures Performed: Wound Number: 31 N/A N/A Photos: N/A N/A Left, Medial Lower Leg N/A N/A Wound Location: Gradually Appeared N/A N/A Wounding Event: T be determined o N/A N/A Primary Etiology: Glaucoma, Sleep Apnea, N/A N/A Comorbid History: Hypertension, Peripheral Arterial Disease, Peripheral Venous Disease, Type II Diabetes, Gout, Osteoarthritis, Neuropathy 06/28/2022 N/A N/A Date Acquired: 0 N/A N/A Weeks of Treatment: Open N/A N/A Wound Status: No N/A N/A Wound Recurrence: No N/A N/A Clustered Wound: N/A N/A N/A Clustered Quantity: 0.8x0.5x0.1 N/A N/A Measurements L x W x D (cm) 0.314 N/A N/A A (cm) : rea 0.031 N/A N/A Volume (cm) : N/A N/A N/A % Reduction in A rea: N/A N/A N/A % Reduction in Volume: Full Thickness Without Exposed N/A  N/A Classification: Support Structures Medium N/A N/A Exudate A mount: Serosanguineous N/A N/A Exudate Type: red, brown N/A N/A Exudate Color: Distinct, outline attached N/A N/A Wound Margin: Small (1-33%) N/A N/A Granulation A mount: Red N/A N/A Granulation Quality: Large (67-100%) N/A N/A Necrotic A mount: Adherent Slough N/A N/A Necrotic Tissue: Fat Layer (Subcutaneous Tissue): Yes N/A N/A Exposed Structures: Fascia: No Tendon: No Muscle: No Joint: No Bone: No Small (1-33%) N/A N/A Epithelialization: Debridement - Selective/Open Wound N/A  N/A Debridement: Pre-procedure Verification/Time Out 11:16 N/A N/A Taken: Lidocaine 4% Topical Solution N/A N/A Pain Control: Slough N/A N/A Tissue Debrided: Non-Viable Tissue N/A N/A Level: 0.4 N/A N/A Debridement A (sq cm): rea Curette N/A N/A Instrument: Minimum N/A N/A Bleeding: Pressure N/A N/A Hemostasis A chieved: Procedure was tolerated well N/A N/A Debridement Treatment Response: 0.8x0.5x0.1 N/A N/A Post Debridement Measurements L x W x D (cm) 0.031 N/A N/A Post Debridement Volume: (cm) Scarring: Yes N/A N/A Periwound Skin Texture: Dry/Scaly: Yes N/A N/A Periwound Skin Moisture: Hemosiderin Staining: Yes N/A N/A Periwound Skin Color: No Abnormality N/A N/A Temperature: Debridement N/A N/A Procedures Performed: Treatment Notes Electronic Signature(s) Signed: 06/28/2022 11:23:03 AM By: Fredirick Maudlin MD FACS [name] ([id number]) [id number] Page 5 of 12 Signed: 06/28/2022 11:23:03 AM By: Fredirick Maudlin MD FACS Entered By: Fredirick Maudlin on 06/28/2022 11:23:03 -------------------------------------------------------------------------------- Multi-Disciplinary Care Plan Details Patient Name: Date of Service: [name] Garfinkel. 06/28/2022 10:45 A M Medical Record Number: [id number] Patient Account Number: [id number] Date of Birth/Sex: Treating RN: [birth year] (72 y.o. [name] Primary Care Karenna Romanoff: [name] Other Clinician: Referring Ashaz Robling: Treating Konstantina Nachreiner/Extender: [name] in Treatment: 81 Multidisciplinary Care Plan reviewed with physician Active Inactive Venous Leg Ulcer Nursing Diagnoses: Knowledge deficit related to disease process and management Potential for venous Insuffiency (use before diagnosis confirmed) Goals: Patient will maintain optimal edema control Date Initiated: 07/27/2021 Target Resolution Date: 02/23/2023 Goal Status: Active Interventions: Assess  peripheral edema status every visit. Treatment Activities: Therapeutic compression applied : 07/27/2021 Notes: Wound/Skin Impairment Nursing Diagnoses: Impaired tissue integrity Knowledge deficit related to ulceration/compromised skin integrity Goals: Patient will have a decrease in wound volume by X% from date: (specify in notes) Date Initiated: 04/12/2021 Date Inactivated: 01/04/2022 Target Resolution Date: 04/23/2021 Goal Status: Met Patient/caregiver will verbalize understanding of skin care regimen Date Initiated: 01/04/2022 Target Resolution Date: 02/23/2023 Goal Status: Active Ulcer/skin breakdown will have a volume reduction of 30% by week 4 Date Initiated: 04/12/2021 Date Inactivated: 04/27/2021 Target Resolution Date: 04/27/2021 Goal Status: Unmet Unmet Reason: infection Ulcer/skin breakdown will have a volume reduction of 50% by week 8 Date Initiated: 04/27/2021 Date Inactivated: 06/29/2021 Target Resolution Date: 06/24/2021 Goal Status: Met Interventions: Assess patient/caregiver ability to obtain necessary supplies Assess patient/caregiver ability to perform ulcer/skin care regimen upon admission and as needed Assess ulceration(s) every visit Notes: Electronic Signature(s) Signed: 06/28/2022 2:29:22 PM By: Adline Peals Entered By: Adline Peals on 06/28/2022 11:15:44 [name] ([id number]) [id number] Page 6 of 12 -------------------------------------------------------------------------------- Pain Assessment Details Patient Name: Date of Service: [name], [name] 06/28/2022 10:45 A M Medical Record Number: [id number] Patient Account Number: [id number] Date of Birth/Sex: Treating RN: Oct 25, 1950 (72 y.o. [name] Primary Care Nivea Wojdyla: [name] Other Clinician: Referring Noha Karasik: Treating Curvin Hunger/Extender: [name] in Treatment: 29 Active Problems Location of Pain Severity and  Description of Pain Patient Has Paino No Site Locations Rate the pain. Current Pain Level: 0 Pain Management and Medication Current Pain Management: Electronic  Signature(s) Signed: 06/28/2022 2:29:22 PM By: Sabas Sous By: Adline Peals on 06/28/2022 10:56:51 -------------------------------------------------------------------------------- Patient/Caregiver Education Details Patient Name: Date of Service: [name] Garfinkel 1/4/2024andnbsp10:45 A M Medical Record Number: [id number] Patient Account Number: [id number] Date of Birth/Gender: Treating RN: [birth year] (72 y.o. [name] Primary Care Physician: [name] Other Clinician: Referring Physician: Treating Physician/Extender: [name] in Treatment: 86 Education Assessment Education Provided To: Patient Education Topics Provided Wound/Skin Impairment: Methods: Explain/Verbal Responses: Reinforcements needed, State content correctly [name], [name] ([id number]) [id number] Page 7 of 12 Electronic Signature(s) Signed: 06/28/2022 2:29:22 PM By: Adline Peals Entered By: Adline Peals on 06/28/2022 11:16:01 -------------------------------------------------------------------------------- Wound Assessment Details Patient Name: Date of Service: [name], [name] 06/28/2022 10:45 A M Medical Record Number: [id number] Patient Account Number: [id number] Date of Birth/Sex: Treating RN: [birth year] (72 y.o. [name] Primary Care Ahmeer Tuman: [name] Other Clinician: Referring Markasia Carrol: Treating [place] Stell/Extender: [name] in Treatment: 63 Wound Status Wound Number: 18 Primary Diabetic Wound/Ulcer of the Lower Extremity Etiology: Wound Location: Left, Plantar Metatarsal head first Wound Open Wounding Event: Gradually Appeared Status: Date Acquired: 08/23/2020 Comorbid Glaucoma, Sleep Apnea, Hypertension,  Peripheral Arterial Disease, Weeks Of Treatment: 63 History: Peripheral Venous Disease, Type II Diabetes, Gout, Osteoarthritis, Clustered Wound: No Neuropathy Photos Wound Measurements Length: (cm) Width: (cm) Depth: (cm) Area: (cm) Volume: (cm) 0 % Reduction in Area: 100% 0 % Reduction in Volume: 100% 0 Epithelialization: Large (67-100%) 0 Tunneling: No 0 Undermining: No Wound Description Classification: Grade 2 Wound Margin: Flat and Intact Exudate Amount: None Present Foul Odor After Cleansing: No Slough/Fibrino No Wound Bed Granulation Amount: None Present (0%) Exposed Structure Necrotic Amount: None Present (0%) Fascia Exposed: No Fat Layer (Subcutaneous Tissue) Exposed: No Tendon Exposed: No Muscle Exposed: No Joint Exposed: No Bone Exposed: No Periwound Skin Texture Texture Color No Abnormalities Noted: No No Abnormalities Noted: Yes Callus: Yes Temperature / Pain Temperature: No Abnormality Moisture No Abnormalities Noted: No Dry / ScalyUTAH, DELAUDER ([id number]) [id number] Page 8 of 12 Maceration: No Electronic Signature(s) Signed: 06/28/2022 2:29:22 PM By: Adline Peals Entered By: Adline Peals on 06/28/2022 11:09:47 -------------------------------------------------------------------------------- Wound Assessment Details Patient Name: Date of Service: [name], [name] 06/28/2022 10:45 A M Medical Record Number: [id number] Patient Account Number: [id number] Date of Birth/Sex: Treating RN: 1951/06/01 (72 y.o. [name] Primary Care Nirvan Laban: [name] Other Clinician: Referring Yailen Zemaitis: Treating Shanita Kanan/Extender: [name] in Treatment: 63 Wound Status Wound Number: 22 Primary Cyst Etiology: Wound Location: Left, Proximal, Lateral Lower Leg Wound Open Wounding Event: Bump Status: Date Acquired: 06/03/2021 Comorbid Glaucoma, Sleep Apnea, Hypertension, Peripheral  Arterial Disease, Weeks Of Treatment: 55 History: Peripheral Venous Disease, Type II Diabetes, Gout, Osteoarthritis, Clustered Wound: Yes Neuropathy Photos Wound Measurements Length: (cm) Width: (cm) Depth: (cm) Clustered Quantity: Area: (cm) Volume: (cm) 5.5 % Reduction in Area: -266.8% 1.4 % Reduction in Volume: 54.1% 0.1 Epithelialization: Medium (34-66%) 3 Tunneling: No 6.048 Undermining: No 0.605 Wound Description Classification: Full Thickness With Exposed Suppo Wound Margin: Fibrotic scar, thickened scar Exudate Amount: Medium Exudate Type: Serosanguineous Exudate Color: red, brown rt Structures Foul Odor After Cleansing: No Slough/Fibrino Yes Wound Bed Granulation Amount: Large (67-100%) Exposed Structure Granulation Quality: Red, Pink Fascia Exposed: No Necrotic Amount: Small (1-33%) Fat Layer (Subcutaneous Tissue) Exposed: Yes Necrotic Quality: Eschar, Adherent Slough Tendon Exposed: No Muscle Exposed: No Joint Exposed: No Bone Exposed: No Periwound Skin Texture Texture Color No Abnormalities Noted: No No  Abnormalities Noted: No WELBORN, KEENA ([id number]) [id number] Page 9 of 12 Scarring: Yes Hemosiderin Staining: Yes Moisture Temperature / Pain No Abnormalities Noted: No Temperature: No Abnormality Dry / Scaly: Yes Tenderness on Palpation: Yes Treatment Notes Wound #22 (Lower Leg) Wound Laterality: Left, Lateral, Proximal Cleanser Soap and Water Discharge Instruction: May shower and wash wound with dial antibacterial soap and water prior to dressing change. Wound Cleanser Discharge Instruction: Cleanse the wound with wound cleanser prior to applying a clean dressing using gauze sponges, not tissue or cotton balls. Peri-Wound Care Sween Lotion (Moisturizing lotion) Discharge Instruction: Apply moisturizing lotion to the leg Topical Skintegrity Hydrogel 4 (oz) Discharge Instruction: Apply hydrogel as directed Primary  Dressing Endoform 2x2 in Discharge Instruction: Moisten with Hydrogel or saline Secondary Dressing ABD Pad, 8x10 Discharge Instruction: Apply over primary dressing as directed. Secured With Elastic Bandage 4 inch (ACE bandage) Discharge Instruction: Secure with ACE bandage as directed. Compression Wrap Compression Stockings Add-Ons Electronic Signature(s) Signed: 06/28/2022 2:29:22 PM By: Adline Peals Entered By: Adline Peals on 06/28/2022 11:10:16 -------------------------------------------------------------------------------- Wound Assessment Details Patient Name: Date of Service: [name] Garfinkel. 06/28/2022 10:45 A M Medical Record Number: [id number] Patient Account Number: [id number] Date of Birth/Sex: Treating RN: 27-Feb-1951 (72 y.o. [name] Primary Care Dezman Granda: [name] Other Clinician: Referring Landyn Buckalew: Treating Ife Vitelli/Extender: [name] in Treatment: 63 Wound Status Wound Number: 30 Primary Abrasion Etiology: Wound Location: Left, Anterior Lower Leg Wound Open Wounding Event: Shear/Friction Status: Date Acquired: 06/21/2022 Comorbid Glaucoma, Sleep Apnea, Hypertension, Peripheral Arterial Disease, Weeks Of Treatment: 1 History: Peripheral Venous Disease, Type II Diabetes, Gout, Osteoarthritis, Clustered Wound: No Neuropathy Photos [name], [name] ([id number]) [id number] Page 10 of 12 Wound Measurements Length: (cm) 0.5 Width: (cm) 0.4 Depth: (cm) 0.1 Area: (cm) 0.157 Volume: (cm) 0.016 % Reduction in Area: 60.1% % Reduction in Volume: 59% Epithelialization: Medium (34-66%) Tunneling: No Undermining: No Wound Description Classification: Full Thickness Without Exposed Support Structures Wound Margin: Distinct, outline attached Exudate Amount: Medium Exudate Type: Serous Exudate Color: amber Foul Odor After Cleansing: No Slough/Fibrino Yes Wound Bed Granulation Amount:  Large (67-100%) Exposed Structure Granulation Quality: Red Fascia Exposed: No Necrotic Amount: Small (1-33%) Fat Layer (Subcutaneous Tissue) Exposed: Yes Necrotic Quality: Adherent Slough Tendon Exposed: No Muscle Exposed: No Joint Exposed: No Bone Exposed: No Periwound Skin Texture Texture Color No Abnormalities Noted: Yes No Abnormalities Noted: No Hemosiderin Staining: Yes Moisture No Abnormalities Noted: No Temperature / Pain Maceration: Yes Temperature: No Abnormality Treatment Notes Wound #30 (Lower Leg) Wound Laterality: Left, Anterior Cleanser Soap and Water Discharge Instruction: May shower and wash wound with dial antibacterial soap and water prior to dressing change. Wound Cleanser Discharge Instruction: Cleanse the wound with wound cleanser prior to applying a clean dressing using gauze sponges, not tissue or cotton balls. Peri-Wound Care Topical Primary Dressing Sorbalgon AG Dressing 2x2 (in/in) Discharge Instruction: Apply to wound bed as instructed Secondary Dressing Woven Gauze Sponge, Non-Sterile 4x4 in Discharge Instruction: Apply over primary dressing as directed. Secured With Transpore Surgical Tape, 2x10 (in/yd) Discharge Instruction: Secure dressing with tape as directed. Compression Wrap ThreePress (3 layer compression wrap) Discharge Instruction: Apply three layer compression as directed. [name], [name] ([id number]) [id number] Page 11 of 12 Compression Stockings Add-Ons Electronic Signature(s) Signed: 06/28/2022 2:29:22 PM By: Adline Peals Entered By: Adline Peals on 06/28/2022 11:10:37 -------------------------------------------------------------------------------- Wound Assessment Details Patient Name: Date of Service: [name], [name] 06/28/2022 10:45 A M Medical Record Number: [id number] Patient Account Number: [id number] Date  of Birth/Sex: Treating RN: 1950-12-29 (72 y.o. [name] Primary  Care Arionne Iams: [name] Other Clinician: Referring Lashone Stauber: Treating Relda Agosto/Extender: [name] in Treatment: 63 Wound Status Wound Number: 72 Primary T be determined o Etiology: Wound Location: Left, Medial Lower Leg Wound Open Wounding Event: Gradually Appeared Status: Date Acquired: 06/28/2022 Comorbid Glaucoma, Sleep Apnea, Hypertension, Peripheral Arterial Disease, Weeks Of Treatment: 0 History: Peripheral Venous Disease, Type II Diabetes, Gout, Osteoarthritis, Clustered Wound: No Neuropathy Photos Wound Measurements Length: (cm) 0.8 Width: (cm) 0.5 Depth: (cm) 0.1 Area: (cm) 0.314 Volume: (cm) 0.031 % Reduction in Area: % Reduction in Volume: Epithelialization: Small (1-33%) Tunneling: No Undermining: No Wound Description Classification: Full Thickness Without Exposed Support Structures Wound Margin: Distinct, outline attached Exudate Amount: Medium Exudate Type: Serosanguineous Exudate Color: red, brown Foul Odor After Cleansing: No Slough/Fibrino Yes Wound Bed Granulation Amount: Small (1-33%) Exposed Structure Granulation Quality: Red Fascia Exposed: No Necrotic Amount: Large (67-100%) Fat Layer (Subcutaneous Tissue) Exposed: Yes Necrotic Quality: Adherent Slough Tendon Exposed: No Muscle Exposed: No Joint Exposed: No Bone Exposed: No Periwound Skin Texture Texture Color No Abnormalities Noted: No No Abnormalities Noted: No AIDENN, [name] ([id number]) [id number] Page 12 of 12 Scarring: Yes Hemosiderin Staining: Yes Moisture Temperature / Pain No Abnormalities Noted: No Temperature: No Abnormality Dry / Scaly: Yes Treatment Notes Wound #31 (Lower Leg) Wound Laterality: Left, Medial Cleanser Soap and Water Discharge Instruction: May shower and wash wound with dial antibacterial soap and water prior to dressing change. Wound Cleanser Discharge Instruction: Cleanse the wound with wound  cleanser prior to applying a clean dressing using gauze sponges, not tissue or cotton balls. Peri-Wound Care Topical Primary Dressing Sorbalgon AG Dressing 2x2 (in/in) Discharge Instruction: Apply to wound bed as instructed Secondary Dressing Woven Gauze Sponge, Non-Sterile 4x4 in Discharge Instruction: Apply over primary dressing as directed. Secured With Transpore Surgical Tape, 2x10 (in/yd) Discharge Instruction: Secure dressing with tape as directed. Compression Wrap ThreePress (3 layer compression wrap) Discharge Instruction: Apply three layer compression as directed. Compression Stockings Add-Ons Electronic Signature(s) Signed: 06/28/2022 2:29:22 PM By: Adline Peals Entered By: Adline Peals on 06/28/2022 11:12:30 -------------------------------------------------------------------------------- Vitals Details Patient Name: Date of Service: [name] Garfinkel. 06/28/2022 10:45 A M Medical Record Number: [id number] Patient Account Number: [id number] Date of Birth/Sex: Treating RN: 05-05-1951 (71 y.o. [name] Primary Care Keishawn Rajewski: [name] Other Clinician: Referring Fabiha Rougeau: Treating Khup Sapia/Extender: [name] in Treatment: 56 Vital Signs Time Taken: 10:56 Temperature (F): 98.4 Height (in): 74 Pulse (bpm): 73 Weight (lbs): 238 Respiratory Rate (breaths/min): 18 Body Mass Index (BMI): 30.6 Blood Pressure (mmHg): 136/86 Capillary Blood Glucose (mg/dl): 158 Reference Range: 80 - 120 mg / dl Electronic Signature(s) Signed: 06/28/2022 2:29:22 PM By: Adline Peals Entered By: Adline Peals on 06/28/2022 10:57:01

## 2022-06-28 NOTE — Progress Notes (Signed)
[name], [name] ([id number]) [id number] Page 1 of 20 Visit Report for 06/28/2022 Chief Complaint Document Details Patient Name: Date of Service: [name], [name] 06/28/2022 10:45 A M Medical Record Number: [id number] Patient Account Number: [id number] Date of Birth/Sex: Treating RN: 1950-10-03 (72 y.o. M) Primary Care Provider: Jilda [name] Other Clinician: Referring Provider: Treating Provider/Extender: [name] in Treatment: 63 Information Obtained from: Patient Chief Complaint Left leg and foot ulcers 04/12/2021; patient is here for wounds on his left lower leg and left plantar foot over the first metatarsal head Electronic Signature(s) Signed: 06/28/2022 11:23:13 AM By: Fredirick Maudlin MD FACS Entered By: Fredirick Maudlin on 06/28/2022 11:23:12 -------------------------------------------------------------------------------- Debridement Details Patient Name: Date of Service: [name]. 06/28/2022 10:45 A M Medical Record Number: [id number] Patient Account Number: [id number] Date of Birth/Sex: Treating RN: 23-Aug-1950 (73 y.o. [name] Primary Care Provider: Jilda [name] Other Clinician: Referring Provider: Treating Provider/Extender: [name] in Treatment: 63 Debridement Performed for Assessment: Wound #31 Left,Medial Lower Leg Performed By: Physician Fredirick Maudlin, MD Debridement Type: Debridement Level of Consciousness (Pre-procedure): Awake and Alert Pre-procedure Verification/Time Out Yes - 11:16 Taken: Start Time: 11:16 Pain Control: Lidocaine 4% T opical Solution T Area Debrided (L x W): otal 0.8 (cm) x 0.5 (cm) = 0.4 (cm) Tissue and other material debrided: Non-Viable, Slough, Slough Level: Non-Viable Tissue Debridement Description: Selective/Open Wound Instrument: Curette Bleeding: Minimum Hemostasis Achieved: Pressure Response to Treatment: Procedure was tolerated  well Level of Consciousness (Post- Awake and Alert procedure): Post Debridement Measurements of Total Wound Length: (cm) 0.8 Width: (cm) 0.5 Depth: (cm) 0.1 Volume: (cm) 0.031 Character of Wound/Ulcer Post Debridement: Improved Post Procedure Diagnosis Same as Scharlene Gloss ([id number]) [id number] Page 2 of 20 Notes scribed for Dr. Celine Ahr by Adline Peals, RN Electronic Signature(s) Signed: 06/28/2022 12:11:14 PM By: Fredirick Maudlin MD FACS Signed: 06/28/2022 2:29:22 PM By: Adline Peals Entered By: Adline Peals on 06/28/2022 11:17:04 -------------------------------------------------------------------------------- Debridement Details Patient Name: Date of Service: [name]. 06/28/2022 10:45 A M Medical Record Number: [id number] Patient Account Number: [id number] Date of Birth/Sex: Treating RN: 06-14-1951 (72 y.o. [name] Primary Care Provider: Jilda [name] Other Clinician: Referring Provider: Treating Provider/Extender: [name] in Treatment: 63 Debridement Performed for Assessment: Wound #30 Left,Anterior Lower Leg Performed By: Physician Fredirick Maudlin, MD Debridement Type: Debridement Level of Consciousness (Pre-procedure): Awake and Alert Pre-procedure Verification/Time Out Yes - 11:16 Taken: Start Time: 11:16 Pain Control: Lidocaine 4% T opical Solution T Area Debrided (L x W): otal 0.5 (cm) x 0.4 (cm) = 0.2 (cm) Tissue and other material debrided: Non-Viable, Slough, Slough Level: Non-Viable Tissue Debridement Description: Selective/Open Wound Instrument: Curette Bleeding: Minimum Hemostasis Achieved: Pressure Response to Treatment: Procedure was tolerated well Level of Consciousness (Post- Awake and Alert procedure): Post Debridement Measurements of Total Wound Length: (cm) 0.5 Width: (cm) 0.4 Depth: (cm) 0.1 Volume: (cm) 0.016 Character of Wound/Ulcer  Post Debridement: Improved Post Procedure Diagnosis Same as Pre-procedure Notes scribed for Dr. Celine Ahr by Adline Peals, RN Electronic Signature(s) Signed: 06/28/2022 12:11:14 PM By: Fredirick Maudlin MD FACS Signed: 06/28/2022 2:29:22 PM By: Adline Peals Entered By: Adline Peals on 06/28/2022 11:17:30 -------------------------------------------------------------------------------- Debridement Details Patient Name: Date of Service: [name]. 06/28/2022 10:45 A M Medical Record Number: [id number] Patient Account Number: [id number] Date of Birth/Sex: Treating RN: Feb 01, 1951 (72 y.o. [name] Primary Care Provider: Jilda [name] Other Clinician: Alice [name] ([id number]) [id number] Page 3 of  20 Referring Provider: Treating Provider/Extender: [name] in Treatment: 63 Debridement Performed for Assessment: Wound #22 Left,Proximal,Lateral Lower Leg Performed By: Physician Fredirick Maudlin, MD Debridement Type: Debridement Level of Consciousness (Pre-procedure): Awake and Alert Pre-procedure Verification/Time Out Yes - 11:16 Taken: Start Time: 11:16 Pain Control: Lidocaine 4% T opical Solution T Area Debrided (L x W): otal 5.5 (cm) x 1.4 (cm) = 7.7 (cm) Tissue and other material debrided: Non-Viable, Slough, Slough Level: Non-Viable Tissue Debridement Description: Selective/Open Wound Instrument: Curette Bleeding: Minimum Hemostasis Achieved: Pressure Response to Treatment: Procedure was tolerated well Level of Consciousness (Post- Awake and Alert procedure): Post Debridement Measurements of Total Wound Length: (cm) 5.5 Width: (cm) 1.4 Depth: (cm) 0.1 Volume: (cm) 0.605 Character of Wound/Ulcer Post Debridement: Improved Post Procedure Diagnosis Same as Pre-procedure Notes scribed for Dr. Celine Ahr by Adline Peals, RN Electronic Signature(s) Signed: 06/28/2022 12:11:14 PM By: Fredirick Maudlin MD FACS Signed: 06/28/2022 2:29:22 PM By: Adline Peals Entered By: Adline Peals on 06/28/2022 11:18:42 -------------------------------------------------------------------------------- HPI Details Patient Name: Date of Service: [name]. 06/28/2022 10:45 A M Medical Record Number: [id number] Patient Account Number: [id number] Date of Birth/Sex: Treating RN: 1951-06-10 (72 y.o. M) Primary Care Provider: Jilda [name] Other Clinician: Referring Provider: Treating Provider/Extender: [name] in Treatment: 32 History of Present Illness HPI Description: 10/11/17; Mr. Serna is a 72 year old man who tells me that in 2015 he slipped down the latter traumatizing his left leg. He developed a wound in the same spot the area that we are currently looking at. He states this closed over for the most part although he always felt it was somewhat unstable. In 2016 he hit the same area with the door of his car had this reopened. He tells me that this is never really closed although sometimes an inflow it remains open on a constant basis. He has not been using any specific dressing to this except for topical antibiotics the nature of which were not really sure. His primary doctor did send him to see Dr. [name] of interventional cardiology. He underwent an angiogram on 08/06/17 and he underwent a PTA and directional atherectomy of the lesser distal SFA and popliteal arteries which resulted in brisk improvement in blood flow. It was noted that he had 2 vessel runoff through the anterior tibial and peroneal. He is also been to see vascular and interventional radiologist. He was not felt to have any significant superficial venous insufficiency. Presumably is not a candidate for any ablation. It was suggested he come here for wound care. The patient is a type II diabetic on insulin. He also has a history of venous insufficiency. ABIs on the left were noncompressible in  our clinic 10/21/17; patient we admitted to the clinic last week. 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 We put Iodosorb on him after an aggressive debridement and 3 layer compression. He complained of pain in his ankle and itching with is skin in fact he scratched the area on the medial calf superiorly at the rim of our wraps and he has 2 small open areas in that location today which are new. I changed his primary dressing today to silver collagen. As noted he is already had revascularization and does not have any significant superficial venous insufficiency that would be amenable to ablation JOURNEY, RATTERMAN ([id number]) [id number] Page 4 of 20 10/28/17; patient admitted to the clinic 2 weeks ago. He  has a smaller Wound. Scratch injury from last week revealed. There is large wound over the tibial area. This is smaller. Granulation looks healthy. No need for debridement. 11/04/17; the wound on the left lateral calf looks better. Improved dimensions. Surface of this looks better. We've been maintaining him and Kerlix Coban wraps. He finds this much more comfortable. Silver collagen dressing 11/11/17; left lateral Wound continues to look healthy be making progress. Using a #5 curet I removed removed nonviable skin from the surface of the wound and then necrotic debris from the wound surface. Surface of the wound continues to look healthy. He also has an open area on the left great toenail bed. We've been using topical antibiotics. 11/19/17; left anterior lateral wound continues to look healthy but it's not closed. He also had a small wound above this on the left leg Initially traumatic wounds in the setting of significant chronic venous insufficiency and stasis dermatitis 11/25/17; left anterior wounds superiorly is closed still a small wound inferiorly. 12/02/17; left anterior tibial area. Arrives today with  adherent callus. Post debridement clearly not completely closed. Hydrofera Blue under 3 layer compression. 12/09/17; left anterior tibia. Circumferential eschar however the wound bed looks stable to improved. We've been using Hydrofera Blue under 3 layer compression 12/17/17; left anterior tibia. Apparently this was felt to be closed however when the wrap was taken off there is a skin tear to reopen wounds in the same area we've been using Hydrofera Blue under 3 layer compression 12/23/17 left anterior tibia. Not close to close this week apparently the [facility] was stuck to this again. Still circumferential eschar requiring debridement. I put a contact layer on this this time under the Hydrofera Blue 12/31/17; left anterior tibia. Wound is better slight amount of hyper-granulation. Using Hydrofera Blue over Adaptic. 01/07/18; left anterior tibia. The wound had some surface eschar however after this was removed he has no open wound.he was already revascularized by Dr. [name] when he came to our clinic with atherectomy of the left SFA and popliteal artery. He was also sent to interventional radiology for venous reflux studies. He was not felt to have significant reflux but certainly has chronic venous changes of his skin with hemosiderin deposition around this area. He will definitely need to lubricate his skin and wear compression stocking and I've talked to him about this. READMISSION 05/26/2018 This is a now 72 year old man we cared for with traumatic wounds on his left anterior lower extremity. He had been previously revascularized during that admission by Dr. [name]. Apparently in follow-up Dr. [name] noted that he had deterioration in his arterial status. He underwent a stent placement in the distal left SFA on 04/22/2018. 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 He went back to the angiography suite on 04/30/2018 he underwent PTA and balloon angioplasty of the occluded left mid  anterior tibial artery, thrombotic occlusion went from 100 to 0% which reconstitutes the posterior tibial artery. He had thrombectomy and aspiration of the peroneal artery. The stent placed in the distal SFA left SFA was still occluded. 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 He has been recuperating at [facility] in [facility] after the most recent hospitalization. He is going home tomorrow. He  tells me that 2 weeks ago he traumatized the tip of his left fifth toe. He came in urgently for our review of this. This was a history of before I noted that Dr. [name] had already noted dry gangrenous changes of the left fifth toe 06/09/2018; 2-week follow-up. I did contact Dr. [name] after his last appointment and he apparently saw 1 of Dr. Irven Shelling colleagues the next day. He does not follow-up with Dr. [name] himself until Thursday of this week. 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 Nevertheless there is no evidence of infection no drainage and no pain. He had a new area that this week when we were signing him in today on the left anterior mid tibia area, this is in close proximity to the previous wound we have dealt with in this clinic. 06/23/2018; 2-week follow-up. I did not receive a recent note from Dr. [name] to review today. Our office is trying to obtain this. He is apparently not planning to do further vascular interventions and wondered about compression to try and help with the patient's chronic venous insufficiency. However we are also concerned about the arterial flow. He arrives in clinic today with a new area on the left third toe. The areas on the calf/anterior tibia are close to closing. The left fifth toe is  still mummified using Betadine. -In reviewing things with the patient he has what sounds like claudication with mild to moderate amount of activity. 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 I prescribed Levaquin over the phone while we attempted to arrange a plan of care. However the patient called yesterday to report he had low-grade fever and he came in today acutely. There is been a marked deterioration in the left third toe with spreading cellulitis up into the dorsal left foot. He was referred to the emergency room. Readmission: 06/29/2020 patient presents today for reevaluation here in our clinic he was previously treated by Dr. Dellia Nims at the latter part of 2019 in 2 the beginning of 2020. Subsequently we have not seen him since that time in the interim he did have evaluation with vein and vascular specialist specifically Dr. Anice Paganini who did perform quite extensive work for a left femoral to anterior tibial artery bypass. With that being said in the interim the patient has developed significant lymphedema and has wounds that he tells me have really never healed in regard to the incision site on the left leg. He also has multiple wounds on the feet for various reasons some of which is that he tends to pick at his feet. Fortunately there is no signs of active infection systemically at this time he does have some wounds that are little bit deeper but most are fairly superficial he seems to have good blood flow and overall everything appears to be healthy I see no bone exposed and no obvious signs of osteomyelitis. I do not know that he necessarily needs a x-ray at this point although that something we could consider depending on how things progress. The patient does have a history of lymphedema, diabetes, this is type II, chronic kidney disease stage III, hypertension, and history of peripheral vascular disease. 07/05/2020; patient admitted last week. Is a  patient I remember from 2019 he had a spreading infection involving the left foot and we sent him to the hospital. 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  He subsequently had a left femoral to anterior tibial bypass by Dr.Cain vein and vascular. He also has severe lymphedema with chronic skin changes related to that on the left leg. The most problematic area that was new today was on the left medial great toe. This was apparently a small area last week there was purulent drainage which our intake nurse cultured. Also areas on the left medial foot and heel left lateral foot. He has 2 areas on the left medial calf left lateral calf in the setting of the severe lymphedema. 07/13/2020 on evaluation today patient appears to be doing better in my opinion compared to his last visit. The good news is there is no signs of active infection systemically and locally I do not see any signs of infection either. He did have an x-ray which was negative that is great news he had a culture which showed MRSA but at the same time he is been on the doxycycline which has helped. I do think we may want to extend this for 7 additional days 1/25; patient admitted to the clinic a few weeks ago. 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 We have been putting him under compression his edema control is a lot better but he is severe verricused skin on the left leg. He is really done quite well he still has an open area on the left medial calf and the left medial first metatarsal head. We have been using silver collagen on the leg silver alginate on the foot 07/27/2020 upon evaluation today patient appears to be doing decently well in regard to his wounds. He still has a lot of dry skin on the left leg. Some of this is starting to peel back and I think he may be able to have them out by removing some that today. Fortunately there is no signs of active  infection at this time on the left leg although on the right leg he does appear to have swelling and erythema as well as some mild warmth to touch. This does have been concerned about the possibility of cellulitis although within the differential diagnosis I do think that potentially a DVT has to be at least considered. We need to rule that out before proceeding would just call in the cellulitis. Especially since he is having pain in the posterior aspect of his calf muscle. 2/8; the patient had seen sparingly. He has severe skin changes of chronic lymphedema in the left leg thickened hyperkeratotic verrucous skin. He has an open wound on the medial part of the left first met head left mid tibia. He also has a rim of nonepithelialized skin in the anterior mid tibia. He brought in the AmLactin lotion that was been prescribed although I am not sure under compression and its utility. There concern about cellulitis on the right lower leg the last time he was here. He was put on on antibiotics. His DVT rule out was negative. The right leg looks fine he is using his stocking on this area 08/10/2020 upon evaluation today patient appears to be doing well with regard to his leg currently. He has been tolerating the dressing changes without JANMICHAEL, GIRAUD ([id number]) [id number] Page 5 of 20 complication. Fortunately there is no signs of active infection which is great news. Overall very pleased with where things stand. 2/22; the patient still has an area on the medial part of the left first met his head. This looks better than  when I last saw this earlier this month he has a rim of epithelialization but still some surface debris. Mostly everything on the left leg is healed. There is still a vulnerable in the left mid tibia area. 08/30/2020 upon evaluation today patient appears to be doing much better in regard to his wounds on his foot. Fortunately there does not appear to be any  signs of active infection systemically though locally we did culture this last week and it does appear that he does have MRSA currently. Nonetheless I think we will address that today I Minna send in a prescription for him in that regard. Overall though there does not appear to be any signs of significant worsening. 09/07/2020 on evaluation today patient's wounds over his left foot appear to be doing excellent. I do not see any signs of infection there is some callus buildup this can require debridement for certain but overall I feel like he is managing quite nicely. He still using the AmLactin cream which has been beneficial for him as well. 3/22; left foot wound is closed. There is no open area here. He is using ammonium lactate lotion to the lower extremities to help exfoliate dry cracked skin. He has compression stockings from [facility] in [place]. The wound on the medial part of his left first met head is healed today. READMISSION 04/12/2021 Mr. Blackstock is a patient we know fairly well he had a prolonged stay in clinic in 2019 with wounds on his left lateral and left anterior lower extremity in the setting of chronic venous insufficiency. More recently he was here earlier this year with predominantly an area on his left foot first metatarsal head plantar and he says the plantar foot broke down on its not long after we discharged him but he did not come back here. The last few months areas of broken down on his left anterior and again the left lateral lower extremity. The leg itself is very swollen chronically enlarged a lot of hyperkeratotic dry Berry Q skin in the left lower leg. His edema extends well into the thigh. He was seen by Dr. Donzetta Matters. He had ABIs on 03/02/2021 showing an ABI on the right of 1 with a TBI of 0.72 his ABI in the left at 1.09 TBI of 0.99. Monophasic and biphasic waveforms on the right. On the left monophasic waveforms were noted he went on to have an angiogram on  03/27/2021 this showed the aortic aortic and iliac segments were free of flow-limiting stenosis the left common femoral vein to evaluate the left femoral to anterior tibial artery bypass was unobstructed the bypass was patent without any areas of stenosis. We discharged the patient in bilateral juxta lite stockings but very clearly that was not sufficient to control the swelling and maintain skin integrity. He is clearly going to need compression pumps. 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 This is fortunate because he is on his feet for long periods of time. 10/27; patient comes in with our intake nurse reporting copious amount of green drainage from the left anterior mid tibia the left dorsal foot and to a lesser extent the left medial mid tibia. We left the compression wrap on all week for the amount of edema in his left leg is quite a bit better. We use silver alginate as the primary dressing 11/3; edema control is good. Left anterior lower leg left medial lower  leg and the plantar first metatarsal head. The left anterior lower leg required debridement. Deep tissue culture I did of this wound showed MRSA I put him on 10 days of doxycycline which she will start today. We have him in compression wraps. He has a security card and [employer] however he is retiring on November 15. We will need to then get him into a better offloading boot for the left foot perhaps a total contact cast 11/10; edema control is quite good. Left anterior and left medial lower leg wounds in the setting of chronic venous insufficiency and lymphedema. He also has a substantial area over the left plantar first metatarsal head. 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 He has significant hypergranulation on the left plantar foot wound. The patient is a diabetic but he does not  have significant PAD 11/17; edema control is quite good. Left anterior and left medial lower leg wounds look better. The really concerning area remains the area on the left plantar first metatarsal head. He has a rim of epithelialization. 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 Starting today with a forefoot off loader but . possibly a total contact cast. 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 Arrives with a very significant odor from I think all of the wounds on the left leg and the left foot. Because of this I did not put a total contact cast on him today but will could still consider this. His wife was having cataract surgery which is the reason he missed the appointment 12/6. I saw this man 5 days ago with a swelling below the popliteal fossa. I thought he actually might have a Baker's cyst however the DVT rule out study that we could arrange right away was negative the technician told me this was not a ruptured Baker's cyst. We attempted to get this aspirated by under ultrasound guidance in interventional radiology however all they did was an ultrasound however it shows an extensive fluid collection 62 x 8 x 9.4 in the left thigh and left calf. The patient states he thinks this started 8 days ago or so but he really is not complaining of any pain, fever or systemic symptoms. He has not ha 12/20; after some difficulty I managed to get the patient into see Dr. Donzetta Matters. Eventually he was taken into the hospital and had a drain put in the fluid collection below his left knee posteriorly extending into the posterior thigh. He still has the drain in place. Culture of this showed moderate staff aureus few Morganella and few Klebsiella he is now on doxycycline and ciprofloxacin as  suggested by infectious disease he is on this for a month. The drain will remain in place until it stops draining 12/29; he comes in today with the 1 wound on his left leg and the area on the left plantar first met head significantly smaller. Both look healthy. He still has the drain in the left leg. He says he has to change this daily. Follows up with Dr. Donzetta Matters on January 11. 06/29/2021; the wounds that I am following on the left leg and left first met  head continued to be quite healthy. However the area where his inferior drain is in place had copious amounts of drainage which was green in color. The wound here is larger. Follows up with Dr. Gwenlyn Saran of vein and vascular his surgeon next week as well as infectious disease. He remains on ciprofloxacin and doxycycline. He is not complaining of excessive pain in either one of the drain areas 1/12; the patient saw vascular surgery and infectious disease. Vascular surgery has left the drain in place as there was still some notable drainage still see him back in 2 weeks. Dr. Velna Ochs stop the doxycycline and ciprofloxacin and I do not believe he follows up with them at this point. Culture I did last week showed both doxycycline resistant MRSA and Pseudomonas not sensitive to ciprofloxacin although only in rare titers 1/19; the patient's wound on the left anterior lower leg is just about healed. We have continued healing of the area that was medially on the left leg. Left first plantar metatarsal head continues to get smaller. The major problem here is his 2 drain sites 1 on the left upper calf and lateral thigh. There is purulent drainage still from the left lateral thigh. I gave him antibiotics last week but we still have recultured. He has the drain in the area I think this is eventually going to have to come out. I suspect there will be a connecting wound to heal here perhaps with improved VAc 1/26; the patient had his drain removed by vein and vascular on  1/25/. This was a large pocket of fluid in his left thigh that seem to tunnel into his left upper calf. 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 His mention his Penrose drain was removed today. He now has a tunneling wound on his left calf and left thigh. Both of these probe widely towards each other although I cannot really prove that they connect. Both wounds on his lower leg anteriorly are closed and his area over the first metatarsal head on his right foot continues to improve. We are using Hydrofera Blue here. He also saw infectious disease culture of the abscess they noted was polymicrobial with MRSA, Morganella and Klebsiella he was treated with doxycycline and ciprofloxacin for 4 weeks ending on 07/03/2021. They did not recommend any further antibiotics. Notable that while he still had the Penrose drain in place last week he had purulent drainage coming out of the inferior IandD site this grew [facility], MRSA and Pseudomonas but there does not appear to be any EMELIO, SCHNELLER ([id number]) [id number] Page 6 of 20 active infection in this area today with the drain out and he is not systemically unwell 2/2; with regards to the drain sites the superior one on the thigh actually is closed down the one on the upper left lateral calf measures about 8 and half centimeters which is an improvement seems to be less prominent although still with a lot of drainage. The only remaining wound is over the first metatarsal head on the left foot and this looks to be continuing to improve with Hydrofera Blue. 2/9; the area on his plantar left foot continues to contract. Callus around the wound edge. The drain sites specifically have not come down in depth. We put the wound VAC on Monday he changed the canister late last night our intake nurse reported a pocket of fluid perhaps caused by our compression wraps 2/16; continued improvement in left foot plantar  wound. drainage  site in the calf is not improved in terms of depth (wound vac) 2/23; continued improvement in the left foot wound over the first metatarsal head. With regards to the drain sites the area on his thigh laterally is healed however the open area on his calf is small in terms of circumference by still probes in by about 15 cm. Within using the wound VAC. Hydrofera Blue on his foot 08/24/2021: The left first metatarsal head wound continues to improve. The wound bed is healthy with just some surrounding callus. Unfortunately the open drain site on his calf remains open and tunnels at least 15 cm (the extent of a Q-tip). This is despite several weeks of wound VAC treatment. Based on reading back through the notes, there has been really no significant change in the depth of the wound, although the orifice is smaller and the more cranial wound on his thigh has closed. I suspect the tunnel tracks nearly all the way to this location. 08/31/2021: Continued improvement in the left first metatarsal head wound. There has been absolutely no improvement to the long tunnel from his open drain site on his calf. We have tried to get him into see vascular surgery sooner to consider the possibility of simply filleting the tract open and allowing it to heal from the bottom up, likely with a wound VAC. They have not yet scheduled a sooner appointment than his current mid April 09/14/2021: He was seen by vascular surgery and they took him to the operating room last week. They opened a portion of the tunnel, but did not extend the entire length of the known open subcutaneous tract. I read Dr. Claretha Cooper operative note and it is not clear from that documentation why only a portion of the tract was opened. The heaped up granulation tissue was curetted and removed from at least some portion of the tract. They did place a wound VAC and applied an Unna boot to the leg. The ulcer on his left first metatarsal head is smaller  today. The bed looks good and there is just a small amount of surrounding callus. 09/21/2021: The ulcer on his left first metatarsal head looks to be stalled. There is some callus surrounding the wound but the wound bed itself does not appear particularly dynamic. The tunnel tract on his lateral left leg seems to be roughly the same length or perhaps slightly smaller but the wound bed appears healthy with good granulation tissue. He opened up a new wound on his medial thigh and the site of a prior surgical incision. He says that he did this unconsciously in his sleep by scratching. 09/28/2021: Unfortunately, the ulcer on his left first metatarsal head has extended underneath the callus toward the dorsum of his foot. The medial thigh wounds are roughly the same. The tunnel on his lateral left leg continues to be problematic; it is longer than we are able to actually probe with a Q-tip. I am still not certain as to why Dr. Donzetta Matters did not open this up entirely when he took the patient to the operating room. We will likely be back in the same situation with just a small superficial opening in a long unhealed tract, as the open portion is granulating in nicely. 10/02/2021: The patient was initially scheduled for a nurse visit, but we are also applying a total contact cast today. The plantar foot wound looks clean without significant accumulated callus. We have been applying Prisma silver collagen to the site. 10/05/2021: The patient is  here for his first total contact cast change. We have tried using gauze packing strips in the tunnel on his lateral leg wound, but this does not seem to be working any better than the white VAC foam. The foot ulcer looks about the same with minimal periwound callus. Medial thigh wound is clean with just some overlying eschar. 10/12/2021: The plantar foot wound is stable without any significant accumulation of periwound callus. The surface is viable with good granulation tissue.  The medial thigh wounds are much smaller and are epithelializing. On the other hand, he had purulent drainage coming from the tunnel on his lateral leg. He does go back to see Dr. Donzetta Matters next week and is planning to ask him why the wound tunnel was not completely opened at the time of his most recent operation. 10/19/2021: The plantar foot wound is markedly improved and has epithelial tissue coming through the surface. The medial thigh wounds are nearly closed with just a tiny open area. He did see Dr. Donzetta Matters earlier this week and apparently they did discuss the possibility of opening the sinus tract further and enabling a wound VAC application. Apparently there are some limits as to what Dr. 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 I think if we could get the tract open to the level of the popliteal fossa, this would greatly aid in her ability to get this chart closed. That being said, however, today when I probed the tract with a Q-tip, I was not able to insert the entirety of the Q-tip as I have on previous occasions. The tunnel is shorter by about 4 cm. The surface is clean with good granulation tissue and no further episodes of purulent drainage. 10/30/2021: Last week, the patient underwent surgery and had the long tract in his leg opened. There was a rind that was debrided, according to the operative report. His medial thigh ulcers are closed. The plantar foot wound is clean with a good surface and some built up surrounding callus. 11/06/2021: The overall dimensions of the large wound on his lateral leg remain about the same, but there is good granulation tissue present and the tunneling is a little bit shorter. He has a new wound on his anterior tibial surface, in the same location where he had a similar lesion in the past. The plantar foot wound is clean with some buildup surrounding callus. Just toward the medial aspect of his foot, however, there is an area of  darkening that once debrided, revealed another opening in the skin surface. 11/13/2021: The anterior tibial surface wound is closed. The plantar foot wound has some surrounding callus buildup. The area of darkening that I debrided last week and revealed an opening in the skin surface has closed again. The tunnel in the large wound on his lateral leg has come in by about 3 cm. There is healthy granulation tissue on the entire wound surface. 11/23/2021: The patient was out of town last week and did wet-to-dry dressings on his large wound. 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 Unfortunately, he picked open the wound on his left medial thigh. He says that it was itching and he just could not stop scratching it until it was open again. The wound on his plantar foot is smaller and has not accumulated a tremendous amount of callus. The lateral leg wound is shallower and the tunnel has also decreased in depth. There  is just a little bit of slough accumulation on the surface. 11/30/2021: Another portion of his left medial thigh has been opened up. All of these wounds are fairly superficial with just a little bit of slough and eschar accumulation. The wound on his plantar foot is almost closed with just a bit of eschar and periwound callus accumulation. The lateral leg wound is nearly flush with the surrounding skin and the tunnel is markedly shallower. 12/07/2021: There is just 1 open area on his left medial thigh. It is clean with just a little bit of perimeter eschar. The wound on his plantar foot continues to contract and just has some eschar and periwound callus accumulation. The lateral leg wound is closing at the more distal aspect and the tunnel is smaller. The surface is nearly flush with the surrounding skin and it has a good bed of granulation tissue. 12/14/2021: The thigh and foot wounds are closed. The lateral leg wound has closed  over approximately half of its length. The tunnel continues to contract and the surface is now flush with the surrounding skin. The wound bed has robust granulation tissue. 12/22/2021: The thigh and foot wounds have reopened. The foot wound has a lot of callus accumulation around and over it. The thigh wound is tiny with just a little bit of slough in the wound bed. The lateral leg wound continues to contract. His vascular surgeon took the wound VAC off earlier in the week and the patient has been doing wet-to-dry dressings. There is a little slough accumulation on the surface. The tunnel is about 3 cm in depth at this point. 12/28/2021: The thigh wound is closed again. The foot wound has some callus that subsequently has peeled back exposing just a small slit of a wound. The lateral leg wound Is down to about half the size that it originally was and the tunnel is down to about half a centimeter in depth. 01/04/2022: The thigh wound remains closed. The foot wound has heavy callus overlying the wound site. Once this was debrided, the wound was found to be SAMAY, DELCARLO ([id number]) [id number] Page 7 of 20 closed. The lateral leg wound is smaller again this week and very superficial. No tunnel could be identified. 01/12/2022: The thigh and foot wounds both remain closed. The lateral leg wound is now nearly flush with the skin surface. There is good granulation tissue present with a light layer of slough. 01/19/2022: Due to the way his wrap was placed, the patient did not change the dressing on his thigh at all and so the foam was saturated and his skin is macerated. There is a light layer of slough on the wound surface. The underlying granulation tissue is robust and healthy-appearing. He has heavy callus buildup at the site of his first metatarsal head wound which is still healed. 02/01/2022: He has been in silver alginate. When he removed the dressing from his thigh wound,  however, some leg, superficially reopening a portion of the wound that had healed. In addition, underneath the callus at his left first metatarsal head, there appears to be a blister and the wound appears to be open again. 02/08/2022: The lateral leg wound has contracted substantially. There is eschar and a light layer of slough present. He says that it is starting to pull and is uncomfortable. On inspection, there is some puckering of the scar and the eschar is quite dry; this may account for his symptoms. On his first metatarsal head, the wound is much  smaller with just some eschar on the surface. The callus has not reaccumulated. He reports that he had a blister come up on his medial thigh wound at the distal aspect. It popped and there is now an opening in his skin again. Looking back through his [place] of wound photos, there is what looks like a permanent suture just deep to this location and it may be trying to erode through. We have been using silver alginate on his wounds. 02/15/2022: The lateral leg wound is about half the size it was last week. It is clean with just a little perimeter eschar and light slough. The wound on his first metatarsal head is about the same with heavy callus overlying it. The medial thigh wound is closed again. He does have some skin changes on the top of his foot that looks potentially yeast related. 02/22/2022: The skin on the top of his foot improved with the use of a topical antifungal. The lateral leg wound continues to contract and is again smaller this week. There is a little bit of slough and eschar on the surface. The first metatarsal head wound is a little bit smaller but has reaccumulated a thick callus over the top. He decided to try to trim his toenail and ultimately took the entire nail off of his left great toe. 03/02/2022: His lateral leg wound continues to improve, as does the wound on his left great toe. Unfortunately, it appears that somehow his foot got  wet and moisture seeped in through the opening causing his skin to lift. There is a large wound now overlying his first metatarsal on both the plantar, medial, and dorsal portion of his foot. There is necrotic tissue and slough present underneath the shaggy macerated skin. 03/08/2022: The lateral leg wound is smaller again today. There is just a light layer of slough and eschar on the surface. The great toe wound is smaller again today. The first metatarsal wound is a little bit smaller today and does not look nearly as necrotic and macerated. There is still slough and nonviable tissue present. 03/15/2022: The lateral leg wound is narrower and just has a little bit of light slough buildup. The first metatarsal wound still has a fair amount of moisture affecting the periwound skin. The great toe wound is healed. 03/22/2022: The lateral leg wound is now isolated to just at the level of his knee. There is some eschar and slough accumulation. The first metatarsal head wound has epithelialized tremendously and is about half the size that it was last week. He still has some maceration on the top of his foot and a fungal odor is present. 03/29/2022: T oday the patient's foot was macerated, suggesting that the cast got wet. The patient has also been picking at his dry skin and has enlarged the wound on his left lateral leg. In the time between having his cast removed and my evaluation, he had picked more dry skin and opened up additional wounds on his Achilles area and dorsal foot. The plantar first metatarsal head wound, however, is smaller and clean with just macerated callus around the perimeter and light slough on the surface. The lateral leg wound measured a little bit larger but is also fairly clean with eschar and minimal slough. 04/02/2022: The patient had vascular studies done last Friday and so his cast was not applied. He is here today to have that done. Vascular studies did show that his bypass was  patent. 04/05/2022: Both wounds are smaller and  quite clean. There is just a little biofilm on the lateral leg wound. 10/20; the patient has a wound on the left lateral surgical incision at the level of his lateral knee this looks clean and improved. He is using silver alginate. He also has an area on his left medial foot for which she is using Hydrofera Blue under a total contact cast both wounds are measuring smaller 04/20/2022: The plantar foot wound has contracted considerably and is very close to closing. The lateral leg wound was measured a little larger, but there was a tiny open area that was included in the measurements that was not included last week. He has some eschar around the perimeter but otherwise the wound looks clean. 04/27/2022: The lateral leg wound looks better this week. He says that midweek, he felt it was very dry and began applying hydrogel to the site. I think this was beneficial. The foot wound is nearly closed underneath a thick layer of dry skin and callus. 05/04/2022: The foot wound is healed. He has developed a new small ulcer on his anterior tibial surface about midway up his leg. It has a little slough on the surface. The lateral leg wound still is fairly dry, but clean with just a little biofilm on the surface. 05/11/2022: The wound on his foot reopened on Wednesday. A large blister formed which then broke open revealing the fat layer underneath. The ulcer on his anterior tibial surface is a little bit larger this week. The lateral leg wound has much better moisture balance this week. Fortunately, prior to his foot wound reopening, he did get the cast made for his orthotic. 05/15/2022: Already, the left medial foot wound has improved. The tissue is less macerated and the surface is clean. The ulcer on his anterior tibial surface continues to enlarge. This seems likely secondary to accumulated moisture. The lateral leg wound continues to have an improved moisture  balance with the use of collagen. 05/25/2022: The medial foot wound continues to contract. It is now substantially smaller with just a little slough on the surface. The anterior tibial surface wound continues to enlarge further. Once again, this seems to be secondary to moisture. The lateral leg wound does not seem to be changing much in size, but the moisture balance is better. 06/01/2022: The anterior tibial wound is closed. The medial foot wound is down to just a very small, couple of millimeters, opening. The lateral leg wound has good moisture balance, but remains unchanged in size. 12/15; the patient's anterior tibial wound has reopened, however the area on his right first metatarsal head is closed. The major wound is actually on the superior part of his surgical wound in the left lateral thigh. Not a completely viable surface under illumination. This may at some point require a debridement I think he is currently using Prisma. As noted the left medial foot wound has closed 06/14/2022: The anterior tibial wound has closed. The lateral leg wound has a better surface but is basically unchanged in size. The left medial foot wound has reopened. It looks as though there was some callus accumulation and moisture got under the callus which caused the tissue to break down again. 06/21/2022: A new wound has opened up just distal to the previous anterior tibial wound. It is small but has hypertrophic granulation tissue present. The lateral leg wound is a little bit narrower and has a layer of slough on the surface. The left medial foot wound is down to just a pinhole.  His custom orthotics should be available next week. 06/28/2022: The wound on his first metatarsal head has healed. He has developed a new small wound on his medial lower leg, and an old scar site. The lateral leg DECKARD, STUBER ([id number]) [id number] Page 8 of 20 wound continues to contract but continues to  accumulate slough, as well. Electronic Signature(s) Signed: 06/28/2022 11:31:02 AM By: Fredirick Maudlin MD FACS Previous Signature: 06/28/2022 11:23:59 AM Version By: Fredirick Maudlin MD FACS Entered By: Fredirick Maudlin on 06/28/2022 11:31:02 -------------------------------------------------------------------------------- Physical Exam Details Patient Name: Date of Service: [name]. 06/28/2022 10:45 A M Medical Record Number: [id number] Patient Account Number: [id number] Date of Birth/Sex: Treating RN: 07/28/1950 (72 y.o. M) Primary Care Provider: Jilda [name] Other Clinician: Referring Provider: Treating Provider/Extender: [name] in Treatment: 22 Constitutional . . . . No acute distress. Respiratory Normal work of breathing on room air. Notes 06/28/2022: The wound on his first metatarsal head has healed. He has developed a new small wound on his medial lower leg, and an old scar site. The lateral leg wound continues to contract but continues to accumulate slough, as well. Electronic Signature(s) Signed: 06/28/2022 11:31:36 AM By: Fredirick Maudlin MD FACS Entered By: Fredirick Maudlin on 06/28/2022 11:31:35 -------------------------------------------------------------------------------- Physician Orders Details Patient Name: Date of Service: [name]. 06/28/2022 10:45 A M Medical Record Number: [id number] Patient Account Number: [id number] Date of Birth/Sex: Treating RN: [birth year] (72 y.o. [name] Primary Care Provider: Jilda [name] Other Clinician: Referring Provider: Treating Provider/Extender: [name] in Treatment: 53 Verbal / Phone Orders: No Diagnosis Coding ICD-10 Coding Code Description [phone] Non-pressure chronic ulcer of other part of left lower leg with other specified severity E11.621 Type 2 diabetes mellitus with foot ulcer E11.51 Type 2 diabetes mellitus with diabetic peripheral  angiopathy without gangrene I89.0 Lymphedema, not elsewhere classified I87.322 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 Follow-up Appointments ppointment in 1 week. - Dr Celine Ahr - Room 1 Return A Anesthetic Wound #22 Left,Proximal,Lateral Lower Leg (In clinic) Topical Lidocaine 4% applied to wound bed ANDEN, BARTOLO ([id number]) [id number] Page 9 of 20 Bathing/ Shower/ Hygiene May shower with protection but do not get wound dressing(s) wet. Protect dressing(s) with water repellant cover (for example, large plastic bag) or a cast cover and may then take shower. Edema Control - Lymphedema / SCD / Other Avoid standing for long periods of time. Patient to wear own compression stockings every day. - on right leg; Moisturize legs daily. Compression stocking or Garment 20-30 mm/Hg pressure to: - left leg daily Off-Loading Total Contact Cast to Left Lower Extremity Other: - minimal weight bearing left foot Wound Treatment Wound #22 - Lower Leg Wound Laterality: Left, Lateral, Proximal Cleanser: Soap and Water 3 x Per Week/30 Days Discharge Instructions: May shower and wash wound with dial antibacterial soap and water prior to dressing change. Cleanser: Wound Cleanser 3 x Per Week/30 Days Discharge Instructions: Cleanse the wound with wound cleanser prior to applying a clean dressing using gauze sponges, not tissue or cotton balls. Peri-Wound Care: Sween Lotion (Moisturizing lotion) 3 x Per Week/30 Days Discharge Instructions: Apply moisturizing lotion to the leg Topical: Skintegrity Hydrogel 4 (oz) 3 x Per Week/30 Days Discharge Instructions: Apply hydrogel as directed Prim Dressing: Endoform 2x2 in 3 x Per Week/30 Days ary Discharge Instructions: Moisten with Hydrogel or saline Secondary Dressing: ABD Pad, 8x10 (Generic) 3 x Per Week/30 Days Discharge Instructions: Apply over primary  dressing as directed. Secured With:  Elastic Bandage 4 inch (ACE bandage) 3 x Per Week/30 Days Discharge Instructions: Secure with ACE bandage as directed. Wound #30 - Lower Leg Wound Laterality: Left, Anterior Cleanser: Soap and Water 1 x Per Week/30 Days Discharge Instructions: May shower and wash wound with dial antibacterial soap and water prior to dressing change. Cleanser: Wound Cleanser 1 x Per Week/30 Days Discharge Instructions: Cleanse the wound with wound cleanser prior to applying a clean dressing using gauze sponges, not tissue or cotton balls. Prim Dressing: Sorbalgon AG Dressing 2x2 (in/in) 1 x Per Week/30 Days ary Discharge Instructions: Apply to wound bed as instructed Secondary Dressing: Woven Gauze Sponge, Non-Sterile 4x4 in 1 x Per Week/30 Days Discharge Instructions: Apply over primary dressing as directed. Secured With: Transpore Surgical Tape, 2x10 (in/yd) 1 x Per Week/30 Days Discharge Instructions: Secure dressing with tape as directed. Compression Wrap: ThreePress (3 layer compression wrap) 1 x Per Week/30 Days Discharge Instructions: Apply three layer compression as directed. Wound #31 - Lower Leg Wound Laterality: Left, Medial Cleanser: Soap and Water 1 x Per Week/30 Days Discharge Instructions: May shower and wash wound with dial antibacterial soap and water prior to dressing change. Cleanser: Wound Cleanser 1 x Per Week/30 Days Discharge Instructions: Cleanse the wound with wound cleanser prior to applying a clean dressing using gauze sponges, not tissue or cotton balls. Prim Dressing: Sorbalgon AG Dressing 2x2 (in/in) 1 x Per Week/30 Days ary Discharge Instructions: Apply to wound bed as instructed Secondary Dressing: Woven Gauze Sponge, Non-Sterile 4x4 in 1 x Per Week/30 Days Discharge Instructions: Apply over primary dressing as directed. Secured With: Transpore Surgical Tape, 2x10 (in/yd) 1 x Per Week/30 Days Discharge Instructions: Secure dressing with tape as directed. Compression Wrap:  ThreePress (3 layer compression wrap) 1 x Per Week/30 Days Discharge Instructions: Apply three layer compression as directed. IZEAR, PINE ([id number]) [id number] Page 10 of 20 Patient Medications llergies: No Known Drug Allergies A Notifications Medication Indication Start End 06/28/2022 lidocaine DOSE topical 4 % cream - cream topical Electronic Signature(s) Signed: 06/28/2022 12:11:14 PM By: Fredirick Maudlin MD FACS Entered By: Fredirick Maudlin on 06/28/2022 11:31:54 -------------------------------------------------------------------------------- Problem List Details Patient Name: Date of Service: [name]. 06/28/2022 10:45 A M Medical Record Number: [id number] Patient Account Number: [id number] Date of Birth/Sex: Treating RN: 1950/07/04 (72 y.o. M) Primary Care Provider: Jilda [name] Other Clinician: Referring Provider: Treating Provider/Extender: [name] in Treatment: 65 Active Problems ICD-10 Encounter Code Description Active Date MDM Diagnosis L97.828 Non-pressure chronic ulcer of other part of left lower leg with other specified 04/12/2021 No Yes severity E11.621 Type 2 diabetes mellitus with foot ulcer 04/12/2021 No Yes 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 04/12/2021 No Yes I89.0 Lymphedema, not elsewhere classified 04/12/2021 No Yes I87.322 Chronic venous hypertension (idiopathic) with inflammation of left lower 04/12/2021 No Yes extremity Inactive Problems ICD-10 Code Description Active Date Inactive Date L97.828 Non-pressure chronic ulcer of other part of left lower leg with other specified severity 06/08/2022 06/08/2022 E11.42 Type 2 diabetes mellitus with diabetic polyneuropathy 04/12/2021 04/12/2021 L02.416 Cutaneous abscess of left lower limb 06/13/2021 06/13/2021 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 07/20/2021  07/20/2021 [name] ([id number]) [id number] Page 11 of 20 L97.528 Non-pressure chronic ulcer of other part of left foot with other specified severity 04/12/2021 04/12/2021 Resolved Problems Electronic Signature(s) Signed: 06/28/2022 11:22:54 AM By: Fredirick Maudlin MD FACS Entered By: Fredirick Maudlin on  06/28/2022 11:22:54 -------------------------------------------------------------------------------- Progress Note Details Patient Name: Date of Service: TILMAN, MCCLAREN 06/28/2022 10:45 A M Medical Record Number: [id number] Patient Account Number: [id number] Date of Birth/Sex: Treating RN: 08-Jun-1951 (72 y.o. M) Primary Care Provider: Jilda [name] Other Clinician: Referring Provider: Treating Provider/Extender: [name] in Treatment: 44 Subjective Chief Complaint Information obtained from Patient Left leg and foot ulcers 04/12/2021; patient is here for wounds on his left lower leg and left plantar foot over the first metatarsal head History of Present Illness (HPI) 10/11/17; Mr. Mormile is a 72 year old man who tells me that in 2015 he slipped down the latter traumatizing his left leg. He developed a wound in the same spot the area that we are currently looking at. He states this closed over for the most part although he always felt it was somewhat unstable. In 2016 he hit the same area with the door of his car had this reopened. He tells me that this is never really closed although sometimes an inflow it remains open on a constant basis. He has not been using any specific dressing to this except for topical antibiotics the nature of which were not really sure. His primary doctor did send him to see Dr. [name] of interventional cardiology. He underwent an angiogram on 08/06/17 and he underwent a PTA and directional atherectomy of the lesser distal SFA and popliteal arteries which resulted in brisk improvement in blood flow. It was noted  that he had 2 vessel runoff through the anterior tibial and peroneal. He is also been to see vascular and interventional radiologist. He was not felt to have any significant superficial venous insufficiency. Presumably is not a candidate for any ablation. It was suggested he come here for wound care. The patient is a type II diabetic on insulin. He also has a history of venous insufficiency. ABIs on the left were noncompressible in our clinic 10/21/17; patient we admitted to the clinic last week. 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 We put Iodosorb on him after an aggressive debridement and 3 layer compression. He complained of pain in his ankle and itching with is skin in fact he scratched the area on the medial calf superiorly at the rim of our wraps and he has 2 small open areas in that location today which are new. I changed his primary dressing today to silver collagen. As noted he is already had revascularization and does not have any significant superficial venous insufficiency that would be amenable to ablation 10/28/17; patient admitted to the clinic 2 weeks ago. He has a smaller Wound. Scratch injury from last week revealed. There is large wound over the tibial area. This is smaller. Granulation looks healthy. No need for debridement. 11/04/17; the wound on the left lateral calf looks better. Improved dimensions. Surface of this looks better. We've been maintaining him and Kerlix Coban wraps. He finds this much more comfortable. Silver collagen dressing 11/11/17; left lateral Wound continues to look healthy be making progress. Using a #5 curet I removed removed nonviable skin from the surface of the wound and then necrotic debris from the wound surface. Surface of the wound continues to look healthy. ooHe also has an open area on the left great toenail bed. We've been using topical antibiotics. 11/19/17; left anterior  lateral wound continues to look healthy but it's not closed. ooHe also had a small wound above this on the left leg   ooInitially traumatic wounds in the setting of significant chronic venous insufficiency and stasis dermatitis 11/25/17; left anterior wounds superiorly is closed still a small wound inferiorly. 12/02/17; left anterior tibial area. Arrives today with adherent callus. Post debridement clearly not completely closed. Hydrofera Blue under 3 layer compression. 12/09/17; left anterior tibia. Circumferential eschar however the wound bed looks stable to improved. We've been using Hydrofera Blue under 3 layer compression 12/17/17; left anterior tibia. Apparently this was felt to be closed however when the wrap was taken off there is a skin tear to reopen wounds in the same area we've been using Hydrofera Blue under 3 layer compression 12/23/17 left anterior tibia. Not close to close this week apparently the [facility] was stuck to this again. Still circumferential eschar requiring debridement. I put a contact layer on this this time under the Hydrofera Blue 12/31/17; left anterior tibia. Wound is better slight amount of hyper-granulation. Using Hydrofera Blue over Adaptic. 01/07/18; left anterior tibia. The wound had some surface eschar however after this was removed he has no open wound.he was already revascularized by Dr. [name] when he came to our clinic with atherectomy of the left SFA and popliteal artery. He was also sent to interventional radiology for venous reflux studies. He was not felt to have significant reflux but certainly has chronic venous changes of his skin with hemosiderin deposition around this area. He will definitely need to lubricate his skin and wear compression stocking and I've talked to him about this. READMISSION 05/26/2018 This is a now 72 year old man we cared for with traumatic wounds on his left anterior lower extremity. He had been previously revascularized during  that admission by Dr. [name]. Apparently in follow-up Dr. [name] noted that he had deterioration in his arterial status. He underwent a stent placement in the distal left SFA on 04/22/2018. 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 He went back to the angiography suite on 04/30/2018 he underwent PTA and DEONTEZ, KLINKE ([id number]) [id number] Page 12 of 20 balloon angioplasty of the occluded left mid anterior tibial artery, thrombotic occlusion went from 100 to 0% which reconstitutes the posterior tibial artery. He had thrombectomy and aspiration of the peroneal artery. The stent placed in the distal SFA left SFA was still occluded. 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 He has been recuperating at [place] [facility] in [facility] after the most recent hospitalization. He is going home tomorrow. He tells me that 2 weeks ago he traumatized the tip of his left fifth toe. He came in urgently for our review of this. This was a history of before I noted that Dr. [name] had already noted dry gangrenous changes of the left fifth toe 06/09/2018; 2-week follow-up. I did contact Dr. [name] after his last appointment and he apparently saw 1 of Dr. Irven Shelling colleagues the next day. He does not follow-up with Dr. [name] himself until Thursday of this week. 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 Nevertheless there is no evidence of infection no drainage and no pain. He had a new area that this week when we were signing him in today on the left anterior mid tibia area, this is in close proximity to the  previous wound we have dealt with in this clinic. 06/23/2018; 2-week follow-up. I did not receive a  recent note from Dr. [name] to review today. Our office is trying to obtain this. He is apparently not planning to do further vascular interventions and wondered about compression to try and help with the patient's chronic venous insufficiency. However we are also concerned about the arterial flow. ooHe arrives in clinic today with a new area on the left third toe. The areas on the calf/anterior tibia are close to closing. The left fifth toe is still mummified using Betadine. -In reviewing things with the patient he has what sounds like claudication with mild to moderate amount of activity. 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 I prescribed Levaquin over the phone while we attempted to arrange a plan of care. However the patient called yesterday to report he had low-grade fever and he came in today acutely. There is been a marked deterioration in the left third toe with spreading cellulitis up into the dorsal left foot. He was referred to the emergency room. Readmission: 06/29/2020 patient presents today for reevaluation here in our clinic he was previously treated by Dr. Dellia Nims at the latter part of 2019 in 2 the beginning of 2020. Subsequently we have not seen him since that time in the interim he did have evaluation with vein and vascular specialist specifically Dr. Anice Paganini who did perform quite extensive work for a left femoral to anterior tibial artery bypass. With that being said in the interim the patient has developed significant lymphedema and has wounds that he tells me have really never healed in regard to the incision site on the left leg. He also has multiple wounds on the feet for various reasons some of which is that he tends to pick at his feet. Fortunately there is no signs of active infection systemically at this time he does have some wounds that are little bit deeper but most are fairly superficial he seems to have good blood flow and overall  everything appears to be healthy I see no bone exposed and no obvious signs of osteomyelitis. I do not know that he necessarily needs a x-ray at this point although that something we could consider depending on how things progress. The patient does have a history of lymphedema, diabetes, this is type II, chronic kidney disease stage III, hypertension, and history of peripheral vascular disease. 07/05/2020; patient admitted last week. Is a patient I remember from 2019 he had a spreading infection involving the left foot and we sent him to the hospital. 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 He subsequently had a left femoral to anterior tibial bypass by Dr.Cain vein and vascular. He also has severe lymphedema with chronic skin changes related to that on the left leg. The most problematic area that was new today was on the left medial great toe. This was apparently a small area last week there was purulent drainage which our intake nurse cultured. Also areas on the left medial foot and heel left lateral foot. He has 2 areas on the left medial calf left lateral calf in the setting of the severe lymphedema. 07/13/2020 on evaluation today patient appears to be doing better in my opinion compared to his last visit. The good news is there is no signs of active infection systemically and locally I do not see any signs of infection either. He did have  an x-ray which was negative that is great news he had a culture which showed MRSA but at the same time he is been on the doxycycline which has helped. I do think we may want to extend this for 7 additional days 1/25; patient admitted to the clinic a few weeks ago. 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 We have been putting him under compression his edema control is a lot better but he is severe verricused skin on the left leg. He is really done quite well he still has an  open area on the left medial calf and the left medial first metatarsal head. We have been using silver collagen on the leg silver alginate on the foot 07/27/2020 upon evaluation today patient appears to be doing decently well in regard to his wounds. He still has a lot of dry skin on the left leg. Some of this is starting to peel back and I think he may be able to have them out by removing some that today. Fortunately there is no signs of active infection at this time on the left leg although on the right leg he does appear to have swelling and erythema as well as some mild warmth to touch. This does have been concerned about the possibility of cellulitis although within the differential diagnosis I do think that potentially a DVT has to be at least considered. We need to rule that out before proceeding would just call in the cellulitis. Especially since he is having pain in the posterior aspect of his calf muscle. 2/8; the patient had seen sparingly. He has severe skin changes of chronic lymphedema in the left leg thickened hyperkeratotic verrucous skin. He has an open wound on the medial part of the left first met head left mid tibia. He also has a rim of nonepithelialized skin in the anterior mid tibia. He brought in the AmLactin lotion that was been prescribed although I am not sure under compression and its utility. There concern about cellulitis on the right lower leg the last time he was here. He was put on on antibiotics. His DVT rule out was negative. The right leg looks fine he is using his stocking on this area 08/10/2020 upon evaluation today patient appears to be doing well with regard to his leg currently. He has been tolerating the dressing changes without complication. Fortunately there is no signs of active infection which is great news. Overall very pleased with where things stand. 2/22; the patient still has an area on the medial part of the left first met his head. This looks better  than when I last saw this earlier this month he has a rim of epithelialization but still some surface debris. Mostly everything on the left leg is healed. There is still a vulnerable in the left mid tibia area. 08/30/2020 upon evaluation today patient appears to be doing much better in regard to his wounds on his foot. 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 Nonetheless I think we will address that today I Minna send in a prescription for him in that regard. Overall though there does not appear to be any signs of significant worsening. 09/07/2020 on evaluation today patient's wounds over his left foot appear to be doing excellent. I do not see any signs of infection there is some  callus buildup this can require debridement for certain but overall I feel like he is managing quite nicely. He still using the AmLactin cream which has been beneficial for him as well. 3/22; left foot wound is closed. There is no open area here. He is using ammonium lactate lotion to the lower extremities to help exfoliate dry cracked skin. He has compression stockings from elastic therapy in . The wound on the medial part of his left first met head is healed today. READMISSION 04/12/2021 Mr. Cieslinski is a patient we know fairly well he had a prolonged stay in clinic in 2019 with wounds on his left lateral and left anterior lower extremity in the setting of chronic venous insufficiency. More recently he was here earlier this year with predominantly an area on his left foot first metatarsal head plantar and he says the plantar foot broke down on its not long after we discharged him but he did not come back here. The last few months areas of broken down on his left anterior and again the left lateral lower extremity. The leg itself is very swollen chronically enlarged a lot of  hyperkeratotic dry Berry Q skin in the left lower leg. His edema extends well into the thigh. He was seen by Dr. Donzetta Matters. He had ABIs on 03/02/2021 showing an ABI on the right of 1 with a TBI of 0.72 his ABI in the left at 1.09 TBI of 0.99. Monophasic and biphasic waveforms on the right. On the left monophasic waveforms were noted he went on to have an angiogram on 03/27/2021 this showed the aortic aortic and iliac segments were free of flow-limiting stenosis the left common femoral vein to evaluate the left femoral to anterior tibial artery bypass was unobstructed the bypass was patent without any areas of stenosis. We discharged the patient in bilateral juxta lite stockings but very clearly that was not sufficient to control the swelling and maintain skin integrity. He is clearly going to need compression pumps. GAEGE, SANGALANG ([id number]) [id number] Page 13 of 20 The patient is a security guard at a [employer] but he is telling me he is going to retire in 25 days. This is fortunate because he is on his feet for long periods of time. 10/27; patient comes in with our intake nurse reporting copious amount of green drainage from the left anterior mid tibia the left dorsal foot and to a lesser extent the left medial mid tibia. We left the compression wrap on all week for the amount of edema in his left leg is quite a bit better. We use silver alginate as the primary dressing 11/3; edema control is good. Left anterior lower leg left medial lower leg and the plantar first metatarsal head. The left anterior lower leg required debridement. Deep tissue culture I did of this wound showed MRSA I put him on 10 days of doxycycline which she will start today. We have him in compression wraps. He has a security card and [employer] however he is retiring on November 15. We will need to then get him into a better offloading boot for the left foot perhaps a total contact cast 11/10; edema control is quite  good. Left anterior and left medial lower leg wounds in the setting of chronic venous insufficiency and lymphedema. He also has a substantial area over the left plantar first metatarsal head. 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  He has significant hypergranulation on the left plantar foot wound. The patient is a diabetic but he does not have significant PAD 11/17; edema control is quite good. Left anterior and left medial lower leg wounds look better. The really concerning area remains the area on the left plantar first metatarsal head. He has a rim of epithelialization. 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 Starting today with a forefoot off loader but . possibly a total contact cast. 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 Arrives with a very significant odor from I think all of the wounds on the left leg and the left foot. Because of this I did not put a total contact cast on him today but will could still consider this. His wife was having cataract surgery which is the reason he missed the appointment 12/6. I saw this man 5 days ago with a swelling below the popliteal fossa. I thought he actually might have a Baker's cyst however the DVT rule out study that we could arrange right away was negative the technician told me this was not a ruptured Baker's cyst. We attempted to get this aspirated by under ultrasound guidance in interventional radiology however all they did was an ultrasound however it shows an extensive fluid collection 62 x 8 x 9.4 in the left thigh and left calf. The patient states he thinks this started 8 days ago or so but he really is not complaining of any  pain, fever or systemic symptoms. He has not ha 12/20; after some difficulty I managed to get the patient into see Dr. Donzetta Matters. Eventually he was taken into the hospital and had a drain put in the fluid collection below his left knee posteriorly extending into the posterior thigh. He still has the drain in place. Culture of this showed moderate staff aureus few Morganella and few Klebsiella he is now on doxycycline and ciprofloxacin as suggested by infectious disease he is on this for a month. The drain will remain in place until it stops draining 12/29; he comes in today with the 1 wound on his left leg and the area on the left plantar first met head significantly smaller. Both look healthy. He still has the drain in the left leg. He says he has to change this daily. Follows up with Dr. Donzetta Matters on January 11. 06/29/2021; the wounds that I am following on the left leg and left first met head continued to be quite healthy. However the area where his inferior drain is in place had copious amounts of drainage which was green in color. The wound here is larger. Follows up with Dr. Gwenlyn Saran of vein and vascular his surgeon next week as well as infectious disease. He remains on ciprofloxacin and doxycycline. He is not complaining of excessive pain in either one of the drain areas 1/12; the patient saw vascular surgery and infectious disease. Vascular surgery has left the drain in place as there was still some notable drainage still see him back in 2 weeks. Dr. Velna Ochs stop the doxycycline and ciprofloxacin and I do not believe he follows up with them at this point. Culture I did last week showed both doxycycline resistant MRSA and Pseudomonas not sensitive to ciprofloxacin although only  in rare titers 1/19; the patient's wound on the left anterior lower leg is just about healed. We have continued healing of the area that was medially on the left leg. Left first plantar metatarsal head continues to get smaller. The major  problem here is his 2 drain sites 1 on the left upper calf and lateral thigh. There is purulent drainage still from the left lateral thigh. I gave him antibiotics last week but we still have recultured. He has the drain in the area I think this is eventually going to have to come out. I suspect there will be a connecting wound to heal here perhaps with improved VAc 1/26; the patient had his drain removed by vein and vascular on 1/25/. This was a large pocket of fluid in his left thigh that seem to tunnel into his left upper calf. 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 His mention his Penrose drain was removed today. He now has a tunneling wound on his left calf and left thigh. Both of these probe widely towards each other although I cannot really prove that they connect. Both wounds on his lower leg anteriorly are closed and his area over the first metatarsal head on his right foot continues to improve. We are using Hydrofera Blue here. He also saw infectious disease culture of the abscess they noted was polymicrobial with MRSA, Morganella and Klebsiella he was treated with doxycycline and ciprofloxacin for 4 weeks ending on 07/03/2021. They did not recommend any further antibiotics. Notable that while he still had the Penrose drain in place last week he had purulent drainage coming out of the inferior IandD site this grew [facility], MRSA and Pseudomonas but there does not appear to be any active infection in this area today with the drain out and he is not systemically unwell 2/2; with regards to the drain sites the superior one on the thigh actually is closed down the one on the upper left lateral calf measures about 8 and half centimeters which is an improvement seems to be less prominent although still with a lot of drainage. The only remaining wound is over the first metatarsal head on the left foot and this looks to be continuing to improve with Hydrofera Blue. 2/9; the  area on his plantar left foot continues to contract. Callus around the wound edge. The drain sites specifically have not come down in depth. We put the wound VAC on Monday he changed the canister late last night our intake nurse reported a pocket of fluid perhaps caused by our compression wraps 2/16; continued improvement in left foot plantar wound. drainage site in the calf is not improved in terms of depth (wound vac) 2/23; continued improvement in the left foot wound over the first metatarsal head. With regards to the drain sites the area on his thigh laterally is healed however the open area on his calf is small in terms of circumference by still probes in by about 15 cm. Within using the wound VAC. Hydrofera Blue on his foot 08/24/2021: The left first metatarsal head wound continues to improve. The wound bed is healthy with just some surrounding callus. Unfortunately the open drain site on his calf remains open and tunnels at least 15 cm (the extent of a Q-tip). This is despite several weeks of wound VAC treatment. Based on reading back through the notes, there has been really no significant change in the depth of the wound, although the orifice is smaller and  the more cranial wound on his thigh has closed. I suspect the tunnel tracks nearly all the way to this location. 08/31/2021: Continued improvement in the left first metatarsal head wound. There has been absolutely no improvement to the long tunnel from his open drain site on his calf. We have tried to get him into see vascular surgery sooner to consider the possibility of simply filleting the tract open and allowing it to heal from the bottom up, likely with a wound VAC. They have not yet scheduled a sooner appointment than his current mid April 09/14/2021: He was seen by vascular surgery and they took him to the operating room last week. They opened a portion of the tunnel, but did not extend the entire length of the known open subcutaneous  tract. I read Dr. Claretha Cooper operative note and it is not clear from that documentation why only a portion of the tract was opened. The heaped up granulation tissue was curetted and removed from at least some portion of the tract. They did place a wound VAC and applied an Unna boot to the leg. The ulcer on his left first metatarsal head is smaller today. The bed looks good and there is just a small amount of surrounding callus. 09/21/2021: The ulcer on his left first metatarsal head looks to be stalled. There is some callus surrounding the wound but the wound bed itself does not appear particularly dynamic. The tunnel tract on his lateral left leg seems to be roughly the same length or perhaps slightly smaller but the wound bed appears healthy BARY, LIMBACH ([id number]) [id number] Page 14 of 20 with good granulation tissue. He opened up a new wound on his medial thigh and the site of a prior surgical incision. He says that he did this unconsciously in his sleep by scratching. 09/28/2021: Unfortunately, the ulcer on his left first metatarsal head has extended underneath the callus toward the dorsum of his foot. The medial thigh wounds are roughly the same. The tunnel on his lateral left leg continues to be problematic; it is longer than we are able to actually probe with a Q-tip. I am still not certain as to why Dr. Donzetta Matters did not open this up entirely when he took the patient to the operating room. We will likely be back in the same situation with just a small superficial opening in a long unhealed tract, as the open portion is granulating in nicely. 10/02/2021: The patient was initially scheduled for a nurse visit, but we are also applying a total contact cast today. The plantar foot wound looks clean without significant accumulated callus. We have been applying Prisma silver collagen to the site. 10/05/2021: The patient is here for his first total contact cast change. We have tried  using gauze packing strips in the tunnel on his lateral leg wound, but this does not seem to be working any better than the white VAC foam. The foot ulcer looks about the same with minimal periwound callus. Medial thigh wound is clean with just some overlying eschar. 10/12/2021: The plantar foot wound is stable without any significant accumulation of periwound callus. The surface is viable with good granulation tissue. The medial thigh wounds are much smaller and are epithelializing. On the other hand, he had purulent drainage coming from the tunnel on his lateral leg. He does go back to see Dr. Donzetta Matters next week and is planning to ask him why the wound tunnel was not completely opened at the time of his most  recent operation. 10/19/2021: The plantar foot wound is markedly improved and has epithelial tissue coming through the surface. The medial thigh wounds are nearly closed with just a tiny open area. He did see Dr. Donzetta Matters earlier this week and apparently they did discuss the possibility of opening the sinus tract further and enabling a wound VAC application. Apparently there are some limits as to what Dr. 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 I think if we could get the tract open to the level of the popliteal fossa, this would greatly aid in her ability to get this chart closed. That being said, however, today when I probed the tract with a Q-tip, I was not able to insert the entirety of the Q-tip as I have on previous occasions. The tunnel is shorter by about 4 cm. The surface is clean with good granulation tissue and no further episodes of purulent drainage. 10/30/2021: Last week, the patient underwent surgery and had the long tract in his leg opened. There was a rind that was debrided, according to the operative report. His medial thigh ulcers are closed. The plantar foot wound is clean with a good surface and some built up surrounding callus. 11/06/2021: The  overall dimensions of the large wound on his lateral leg remain about the same, but there is good granulation tissue present and the tunneling is a little bit shorter. He has a new wound on his anterior tibial surface, in the same location where he had a similar lesion in the past. The plantar foot wound is clean with some buildup surrounding callus. Just toward the medial aspect of his foot, however, there is an area of darkening that once debrided, revealed another opening in the skin surface. 11/13/2021: The anterior tibial surface wound is closed. The plantar foot wound has some surrounding callus buildup. The area of darkening that I debrided last week and revealed an opening in the skin surface has closed again. The tunnel in the large wound on his lateral leg has come in by about 3 cm. There is healthy granulation tissue on the entire wound surface. 11/23/2021: The patient was out of town last week and did wet-to-dry dressings on his large wound. 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 Unfortunately, he picked open the wound on his left medial thigh. He says that it was itching and he just could not stop scratching it until it was open again. The wound on his plantar foot is smaller and has not accumulated a tremendous amount of callus. The lateral leg wound is shallower and the tunnel has also decreased in depth. There is just a little bit of slough accumulation on the surface. 11/30/2021: Another portion of his left medial thigh has been opened up. All of these wounds are fairly superficial with just a little bit of slough and eschar accumulation. The wound on his plantar foot is almost closed with just a bit of eschar and periwound callus accumulation. The lateral leg wound is nearly flush with the surrounding skin and the tunnel is markedly shallower. 12/07/2021: There is just 1 open area on his left medial thigh. It is clean  with just a little bit of perimeter eschar. The wound on his plantar foot continues to contract and just has some eschar and periwound callus accumulation. The lateral leg wound is closing at the more distal aspect and the tunnel is smaller. The surface is nearly  flush with the surrounding skin and it has a good bed of granulation tissue. 12/14/2021: The thigh and foot wounds are closed. The lateral leg wound has closed over approximately half of its length. The tunnel continues to contract and the surface is now flush with the surrounding skin. The wound bed has robust granulation tissue. 12/22/2021: The thigh and foot wounds have reopened. The foot wound has a lot of callus accumulation around and over it. The thigh wound is tiny with just a little bit of slough in the wound bed. The lateral leg wound continues to contract. His vascular surgeon took the wound VAC off earlier in the week and the patient has been doing wet-to-dry dressings. There is a little slough accumulation on the surface. The tunnel is about 3 cm in depth at this point. 12/28/2021: The thigh wound is closed again. The foot wound has some callus that subsequently has peeled back exposing just a small slit of a wound. The lateral leg wound Is down to about half the size that it originally was and the tunnel is down to about half a centimeter in depth. 01/04/2022: The thigh wound remains closed. The foot wound has heavy callus overlying the wound site. Once this was debrided, the wound was found to be closed. The lateral leg wound is smaller again this week and very superficial. No tunnel could be identified. 01/12/2022: The thigh and foot wounds both remain closed. The lateral leg wound is now nearly flush with the skin surface. There is good granulation tissue present with a light layer of slough. 01/19/2022: Due to the way his wrap was placed, the patient did not change the dressing on his thigh at all and so the foam was saturated and  his skin is macerated. There is a light layer of slough on the wound surface. The underlying granulation tissue is robust and healthy-appearing. He has heavy callus buildup at the site of his first metatarsal head wound which is still healed. 02/01/2022: He has been in silver alginate. When he removed the dressing from his thigh wound, however, some leg, superficially reopening a portion of the wound that had healed. In addition, underneath the callus at his left first metatarsal head, there appears to be a blister and the wound appears to be open again. 02/08/2022: The lateral leg wound has contracted substantially. There is eschar and a light layer of slough present. He says that it is starting to pull and is uncomfortable. On inspection, there is some puckering of the scar and the eschar is quite dry; this may account for his symptoms. On his first metatarsal head, the wound is much smaller with just some eschar on the surface. The callus has not reaccumulated. He reports that he had a blister come up on his medial thigh wound at the distal aspect. It popped and there is now an opening in his skin again. Looking back through his Tibes of wound photos, there is what looks like a permanent suture just deep to this location and it may be trying to erode through. We have been using silver alginate on his wounds. 02/15/2022: The lateral leg wound is about half the size it was last week. It is clean with just a little perimeter eschar and light slough. The wound on his first metatarsal head is about the same with heavy callus overlying it. The medial thigh wound is closed again. He does have some skin changes on the top of his foot that looks potentially yeast related.  02/22/2022: The skin on the top of his foot improved with the use of a topical antifungal. The lateral leg wound continues to contract and is again smaller this week. There is a little bit of slough and eschar on the surface. The first  metatarsal head wound is a little bit smaller but has reaccumulated a thick callus over the top. He decided to try to trim his toenail and ultimately took the entire nail off of his left great toe. 03/02/2022: His lateral leg wound continues to improve, as does the wound on his left great toe. Unfortunately, it appears that somehow his foot got wet and moisture seeped in through the opening causing his skin to lift. There is a large wound now overlying his first metatarsal on both the plantar, medial, and dorsal portion of his foot. There is necrotic tissue and slough present underneath the shaggy macerated skin. JUNAH, YAM ([id number]) [id number] Page 15 of 20 03/08/2022: The lateral leg wound is smaller again today. There is just a light layer of slough and eschar on the surface. The great toe wound is smaller again today. The first metatarsal wound is a little bit smaller today and does not look nearly as necrotic and macerated. There is still slough and nonviable tissue present. 03/15/2022: The lateral leg wound is narrower and just has a little bit of light slough buildup. The first metatarsal wound still has a fair amount of moisture affecting the periwound skin. The great toe wound is healed. 03/22/2022: The lateral leg wound is now isolated to just at the level of his knee. There is some eschar and slough accumulation. The first metatarsal head wound has epithelialized tremendously and is about half the size that it was last week. He still has some maceration on the top of his foot and a fungal odor is present. 03/29/2022: T oday the patient's foot was macerated, suggesting that the cast got wet. The patient has also been picking at his dry skin and has enlarged the wound on his left lateral leg. In the time between having his cast removed and my evaluation, he had picked more dry skin and opened up additional wounds on his Achilles area and dorsal foot. The plantar  first metatarsal head wound, however, is smaller and clean with just macerated callus around the perimeter and light slough on the surface. The lateral leg wound measured a little bit larger but is also fairly clean with eschar and minimal slough. 04/02/2022: The patient had vascular studies done last Friday and so his cast was not applied. He is here today to have that done. 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 04/05/2022: Both wounds are smaller and quite clean. There is just a little biofilm on the lateral leg wound. 10/20; the patient has a wound on the left lateral surgical incision at the level of his lateral knee this looks clean and improved. He is using silver alginate. He also has an area on his left medial foot for which she is using Hydrofera Blue under a total contact cast both wounds are measuring smaller 04/20/2022: The plantar foot wound has contracted considerably and is very close to closing. The lateral leg wound was measured a little larger, but there was a tiny open area that was included in the measurements that was not included last week. He has some eschar around the perimeter but otherwise the wound looks clean. 04/27/2022: The lateral leg wound looks better this week. He says that midweek, he  felt it was very dry and began applying hydrogel to the site. I think this was beneficial. The foot wound is nearly closed underneath a thick layer of dry skin and callus. 05/04/2022: The foot wound is healed. He has developed a new small ulcer on his anterior tibial surface about midway up his leg. It has a little slough on the surface. The lateral leg wound still is fairly dry, but clean with just a little biofilm on the surface. 05/11/2022: The wound on his foot reopened on Wednesday. A large blister formed which then broke open revealing the fat layer underneath. The ulcer on his anterior tibial surface is a little bit larger this week. The lateral leg wound has much  better moisture balance this week. Fortunately, prior to his foot wound reopening, he did get the cast made for his orthotic. 05/15/2022: Already, the left medial foot wound has improved. The tissue is less macerated and the surface is clean. The ulcer on his anterior tibial surface continues to enlarge. This seems likely secondary to accumulated moisture. The lateral leg wound continues to have an improved moisture balance with the use of collagen. 05/25/2022: The medial foot wound continues to contract. It is now substantially smaller with just a little slough on the surface. The anterior tibial surface wound continues to enlarge further. Once again, this seems to be secondary to moisture. The lateral leg wound does not seem to be changing much in size, but the moisture balance is better. 06/01/2022: The anterior tibial wound is closed. The medial foot wound is down to just a very small, couple of millimeters, opening. The lateral leg wound has good moisture balance, but remains unchanged in size. 12/15; the patient's anterior tibial wound has reopened, however the area on his right first metatarsal head is closed. The major wound is actually on the superior part of his surgical wound in the left lateral thigh. Not a completely viable surface under illumination. This may at some point require a debridement I think he is currently using Prisma. As noted the left medial foot wound has closed 06/14/2022: The anterior tibial wound has closed. The lateral leg wound has a better surface but is basically unchanged in size. The left medial foot wound has reopened. It looks as though there was some callus accumulation and moisture got under the callus which caused the tissue to break down again. 06/21/2022: A new wound has opened up just distal to the previous anterior tibial wound. It is small but has hypertrophic granulation tissue present. The lateral leg wound is a little bit narrower and has a layer of  slough on the surface. The left medial foot wound is down to just a pinhole. His custom orthotics should be available next week. 06/28/2022: The wound on his first metatarsal head has healed. He has developed a new small wound on his medial lower leg, and an old scar site. The lateral leg wound continues to contract but continues to accumulate slough, as well. Patient History Information obtained from Patient. Family History Diabetes - Mother, Heart Disease - Paternal Grandparents,Mother,Father,Siblings, Stroke - Father, No family history of Cancer, Hereditary Spherocytosis, Hypertension, Kidney Disease, Lung Disease, Seizures, Thyroid Problems, Tuberculosis. Social History Former smoker - quit 1999, Marital Status - Married, Alcohol Use - Moderate, Drug Use - No History, Caffeine Use - Rarely. Medical History Eyes Patient has history of Glaucoma - both eyes Denies history of Cataracts, Optic Neuritis Ear/Nose/Mouth/Throat Denies history of Chronic sinus problems/congestion, Middle ear problems Hematologic/Lymphatic Denies  history of Anemia, Hemophilia, Human Immunodeficiency Virus, Lymphedema, Sickle Cell Disease Respiratory Patient has history of Sleep Apnea - CPAP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

## 2022-07-03 ENCOUNTER — Encounter (HOSPITAL_BASED_OUTPATIENT_CLINIC_OR_DEPARTMENT_OTHER): Payer: Medicare Other | Admitting: General Surgery

## 2022-07-03 DIAGNOSIS — E11621 Type 2 diabetes mellitus with foot ulcer: Secondary | ICD-10-CM | POA: Diagnosis not present

## 2022-07-03 NOTE — Progress Notes (Signed)
KARTIK, FERNANDO ([id number]) [id number] Page 1 of 12 Visit Report for 07/03/2022 Arrival Information Details Patient Name: Date of Service: [name], [name] 07/03/2022 7:45 A M Medical Record Number: [id number] Patient Account Number: [id number] Date of Birth/Sex: Treating RN: 1951-04-07 (72 y.o. [name] Primary Care Analysse Quinonez: [name] Other Clinician: Referring Ronne Savoia: Treating Maricel Swartzendruber/Extender: [name] in Treatment: 53 Visit Information History Since Last Visit Added or deleted any medications: No Patient Arrived: Ambulatory Any new allergies or adverse reactions: No Arrival Time: 07:43 Had a fall or experienced change in No Accompanied By: self activities of daily living that may affect Transfer Assistance: None risk of falls: Patient Identification Verified: Yes Signs or symptoms of abuse/neglect since last visito No Secondary Verification Process Completed: Yes Hospitalized since last visit: No Patient Requires Transmission-Based Precautions: No Implantable device outside of the clinic excluding No Patient Has Alerts: Yes cellular tissue based products placed in the center since last visit: Has Dressing in Place as Prescribed: Yes Has Compression in Place as Prescribed: Yes Pain Present Now: No Electronic Signature(s) Signed: 07/03/2022 3:35:13 PM By: Adline Peals Entered By: Adline Peals on 07/03/2022 07:43:41 -------------------------------------------------------------------------------- Encounter Discharge Information Details Patient Name: Date of Service: [name] Garfinkel. 07/03/2022 7:45 A M Medical Record Number: [id number] Patient Account Number: [id number] Date of Birth/Sex: Treating RN: 21-Nov-1950 (72 y.o. [name] Primary Care Tyreque Finken: [name] Other Clinician: Referring Cheyan Frees: Treating Tereza Gilham/Extender: [name] in Treatment:  43 Encounter Discharge Information Items Post Procedure Vitals Discharge Condition: Stable Temperature (F): 98.3 Ambulatory Status: Ambulatory Pulse (bpm): 70 Discharge Destination: Home Respiratory Rate (breaths/min): 18 Transportation: Private Auto Blood Pressure (mmHg): 147/69 Accompanied By: self Schedule Follow-up Appointment: Yes Clinical Summary of Care: Patient Declined Electronic Signature(s) Signed: 07/03/2022 3:35:13 PM By: Adline Peals Entered By: Adline Peals on 07/03/2022 09:03:27 [name] ([id number]) [id number] Page 2 of 12 -------------------------------------------------------------------------------- Lower Extremity Assessment Details Patient Name: Date of Service: [name], [name] 07/03/2022 7:45 A M Medical Record Number: [id number] Patient Account Number: [id number] Date of Birth/Sex: Treating RN: 03/05/1951 (72 y.o. [name] Primary Care Chou Busler: [name] Other Clinician: Referring Sheneka Schrom: Treating Kyan Giannone/Extender: [name] in Treatment: 63 Edema Assessment Assessed: [name]: No] [name]: No] Edema: [Left: Ye] [Right: s] Calf Left: Right: Point of Measurement: 41 cm From Medial Instep 47 cm Ankle Left: Right: Point of Measurement: 10 cm From Medial Instep 28 cm Vascular Assessment Pulses: Dorsalis Pedis Palpable: [Left:Yes] Electronic Signature(s) Signed: 07/03/2022 3:35:13 PM By: Adline Peals Entered By: Adline Peals on 07/03/2022 07:47:46 -------------------------------------------------------------------------------- Multi Wound Chart Details Patient Name: Date of Service: [name] Garfinkel. 07/03/2022 7:45 A M Medical Record Number: [id number] Patient Account Number: [id number] Date of Birth/Sex: Treating RN: 1951-05-07 (72 y.o. M) Primary Care Verla Bryngelson: [name] Other Clinician: Referring Yajaira Doffing: Treating Elihu Milstein/Extender: [name] in Treatment: 4 Vital Signs Height(in): 74 Pulse(bpm): 70 Weight(lbs): 238 Blood Pressure(mmHg): 147/69 Body Mass Index(BMI): 30.6 Temperature(F): 98.3 Respiratory Rate(breaths/min): 18 [18R:Photos:] [30:[id number] Page 3 of 12] Left, Plantar Metatarsal head first Left, Proximal, Lateral Lower Leg Left, Anterior Lower Leg Wound Location: Gradually Appeared Bump Shear/Friction Wounding Event: 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 Primary Etiology: Extremity Glaucoma, Sleep Apnea, Glaucoma, Sleep Apnea, Glaucoma, Sleep Apnea, Comorbid History: Hypertension, Peripheral Arterial Hypertension, Peripheral Arterial Hypertension, Peripheral Arterial Disease, Peripheral Venous Disease, Disease, Peripheral Venous Disease, Disease, Peripheral Venous Disease, Type II Diabetes, Gout, Osteoarthritis, Type II Diabetes, Gout,  Osteoarthritis, Type II Diabetes, Gout, Osteoarthritis, Neuropathy Neuropathy Neuropathy 08/23/2020 06/03/2021 06/21/2022 Date Acquired: 34 56 1 Weeks of Treatment: Open Open Open Wound Status: Yes No No Wound Recurrence: No Yes No Clustered Wound: N/A 3 N/A Clustered Quantity: 2.7x3.5x0.1 4.8x1x0.1 0.3x0.3x0.1 Measurements L x W x D (cm) 7.422 3.77 0.071 A (cm) : rea 0.742 0.377 0.007 Volume (cm) : 47.50% -128.60% 81.90% % Reduction in A rea: 73.80% 71.40% 82.10% % Reduction in Volume: Grade 2 Full Thickness With Exposed Support Full Thickness Without Exposed Classification: Structures Support Structures Medium Medium Medium Exudate A mount: Serosanguineous Serosanguineous Serous Exudate Type: red, brown red, brown amber Exudate Color: Flat and Intact Fibrotic scar, thickened scar Distinct, outline attached Wound Margin: Small (1-33%) Large (67-100%) Large (67-100%) Granulation A mount: Red Red, Pink Red Granulation Quality: Large (67-100%) Small (1-33%) Small (1-33%) Necrotic  A mount: Adherent Slough Eschar, Adherent Slough Adherent Slough Necrotic Tissue: Fat Layer (Subcutaneous Tissue): Yes Fat Layer (Subcutaneous Tissue): Yes Fat Layer (Subcutaneous Tissue): Yes Exposed Structures: Fascia: No Fascia: No Fascia: No Tendon: No Tendon: No Tendon: No Muscle: No Muscle: No Muscle: No Joint: No Joint: No Joint: No Bone: No Bone: No Bone: No Small (1-33%) Medium (34-66%) Large (67-100%) Epithelialization: Debridement - Selective/Open Wound Debridement - Selective/Open Wound Debridement - Selective/Open Wound Debridement: Pre-procedure Verification/Time Out 07:57 07:57 07:57 Taken: Lidocaine 4% Topical Solution Lidocaine 4% Topical Solution Lidocaine 4% Topical Solution Pain Control: Tourist information centre manager, [employer] Tissue Debrided: Non-Viable Tissue Non-Viable Tissue Non-Viable Tissue Level: 9.45 4.8 0.09 Debridement A (sq cm): rea Curette Curette Curette Instrument: Minimum Minimum Minimum Bleeding: Pressure Pressure Pressure Hemostasis A chieved: Procedure was tolerated well Procedure was tolerated well Procedure was tolerated well Debridement Treatment Response: 2.7x3.5x0.1 4.8x1x0.1 0.3x0.3x0.1 Post Debridement Measurements L x W x D (cm) 0.742 0.377 0.007 Post Debridement Volume: (cm) Callus: Yes Scarring: Yes No Abnormalities Noted Periwound Skin Texture: Dry/Scaly: Yes Dry/Scaly: Yes Maceration: Yes Periwound Skin Moisture: Maceration: No No Abnormalities Noted Hemosiderin Staining: Yes Hemosiderin Staining: Yes Periwound Skin Color: No Abnormality No Abnormality No Abnormality Temperature: N/A Yes N/A Tenderness on Palpation: Debridement Debridement Debridement Procedures Performed: T Contact Cast otal Wound Number: 31 N/A N/A Photos: N/A N/A Left, Medial Lower Leg N/A N/A Wound Location: Gradually Appeared N/A N/A Wounding Event: T be determined o N/A N/A Primary Etiology: Glaucoma, Sleep Apnea, N/A  N/A Comorbid History: Hypertension, Peripheral Arterial Disease, Peripheral Venous Disease, Type II Diabetes, Gout, Osteoarthritis, Neuropathy 06/28/2022 N/A N/A Date Acquired: 0 N/A N/A Weeks of Treatment: Open N/A N/A Wound Status: No N/A N/A Wound Recurrence: No N/A N/A Clustered Wound: N/A N/A N/A Clustered QuantityMAURO, ARPS ([id number]) [id number] Page 4 of 12 0.3x0.3x0.1 N/A N/A Measurements L x W x D (cm) 0.071 N/A N/A A (cm) : rea 0.007 N/A N/A Volume (cm) : 77.40% N/A N/A % Reduction in Area: 77.40% N/A N/A % Reduction in Volume: Full Thickness Without Exposed N/A N/A Classification: Support Structures Medium N/A N/A Exudate A mount: Serous N/A N/A Exudate Type: amber N/A N/A Exudate Color: Distinct, outline attached N/A N/A Wound Margin: Large (67-100%) N/A N/A Granulation A mount: Red N/A N/A Granulation Quality: Small (1-33%) N/A N/A Necrotic A mount: Eschar, Adherent Slough N/A N/A Necrotic Tissue: Fat Layer (Subcutaneous Tissue): Yes N/A N/A Exposed Structures: Fascia: No Tendon: No Muscle: No Joint: No Bone: No Large (67-100%) N/A N/A Epithelialization: Debridement - Selective/Open Wound N/A N/A Debridement: Pre-procedure Verification/Time Out 07:57 N/A N/A Taken: Lidocaine 4% Topical Solution N/A N/A Pain  Control: Necrotic/Eschar N/A N/A Tissue Debrided: Non-Viable Tissue N/A N/A Level: 0.09 N/A N/A Debridement A (sq cm): rea Curette N/A N/A Instrument: Minimum N/A N/A Bleeding: Pressure N/A N/A Hemostasis A chieved: Procedure was tolerated well N/A N/A Debridement Treatment Response: 0.3x0.3x0.1 N/A N/A Post Debridement Measurements L x W x D (cm) 0.007 N/A N/A Post Debridement Volume: (cm) Scarring: Yes N/A N/A Periwound Skin Texture: Dry/Scaly: Yes N/A N/A Periwound Skin Moisture: Hemosiderin Staining: Yes N/A N/A Periwound Skin Color: No Abnormality N/A  N/A Temperature: Debridement N/A N/A Procedures Performed: Treatment Notes Electronic Signature(s) Signed: 07/03/2022 8:07:09 AM By: Fredirick Maudlin MD FACS Entered By: Fredirick Maudlin on 07/03/2022 08:07:09 -------------------------------------------------------------------------------- Multi-Disciplinary Care Plan Details Patient Name: Date of Service: [name] Garfinkel. 07/03/2022 7:45 A M Medical Record Number: [id number] Patient Account Number: [id number] Date of Birth/Sex: Treating RN: [birth year] (72 y.o. [name] Primary Care Grae Cannata: [name] Other Clinician: Referring Azreal Stthomas: Treating Joshaua [name]/Extender: [name] in Treatment: 60 [place] reviewed with physician Active Inactive Venous Leg Ulcer Nursing Diagnoses: Knowledge deficit related to disease process and management Potential for venous Insuffiency (use before diagnosis confirmed) Goals: Patient will maintain optimal edema control Date Initiated: 07/27/2021 Target Resolution Date: 02/23/2023 [name], [name] ([id number]) [id number] Page 5 of 12 Goal Status: Active Interventions: Assess peripheral edema status every visit. Treatment Activities: Therapeutic compression applied : 07/27/2021 Notes: Wound/Skin Impairment Nursing Diagnoses: Impaired tissue integrity Knowledge deficit related to ulceration/compromised skin integrity Goals: Patient will have a decrease in wound volume by X% from date: (specify in notes) Date Initiated: 04/12/2021 Date Inactivated: 01/04/2022 Target Resolution Date: 04/23/2021 Goal Status: Met Patient/caregiver will verbalize understanding of skin care regimen Date Initiated: 01/04/2022 Target Resolution Date: 02/23/2023 Goal Status: Active Ulcer/skin breakdown will have a volume reduction of 30% by week 4 Date Initiated: 04/12/2021 Date Inactivated: 04/27/2021 Target Resolution Date: 04/27/2021 Goal  Status: Unmet Unmet Reason: infection Ulcer/skin breakdown will have a volume reduction of 50% by week 8 Date Initiated: 04/27/2021 Date Inactivated: 06/29/2021 Target Resolution Date: 06/24/2021 Goal Status: Met Interventions: Assess patient/caregiver ability to obtain necessary supplies Assess patient/caregiver ability to perform ulcer/skin care regimen upon admission and as needed Assess ulceration(s) every visit Notes: Electronic Signature(s) Signed: 07/03/2022 3:35:13 PM By: Adline Peals Entered By: Adline Peals on 07/03/2022 07:56:53 -------------------------------------------------------------------------------- Pain Assessment Details Patient Name: Date of Service: [name] Garfinkel. 07/03/2022 7:45 A M Medical Record Number: [id number] Patient Account Number: [id number] Date of Birth/Sex: Treating RN: Mar 29, 1951 (50 y.o. [name] Primary Care Friend Dorfman: [name] Other Clinician: Referring Raunak Antuna: Treating Jashun Puertas/Extender: [name] in Treatment: 37 Active Problems Location of Pain Severity and Description of Pain Patient Has Paino No Site Locations Rate the pain. [name], [name] ([id number]) [id number] Page 6 of 12 Rate the pain. Current Pain Level: 0 Pain Management and Medication Current Pain Management: Electronic Signature(s) Signed: 07/03/2022 3:35:13 PM By: Adline Peals Entered By: Adline Peals on 07/03/2022 07:44:12 -------------------------------------------------------------------------------- Patient/Caregiver Education Details Patient Name: Date of Service: [name] Garfinkel 1/9/2024andnbsp7:45 A M Medical Record Number: [id number] Patient Account Number: [id number] Date of Birth/Gender: Treating RN: 09/05/1950 (72 y.o. [name] Primary Care Physician: [name] Other Clinician: Referring Physician: Treating Physician/Extender: [name] in Treatment: 36 Education Assessment Education Provided To: Patient Education Topics Provided Pressure: Methods: Explain/Verbal Responses: Reinforcements needed, State content correctly Electronic Signature(s) Signed: 07/03/2022 3:35:13 PM By: Adline Peals Entered By: Adline Peals on 07/03/2022 07:57:17 --------------------------------------------------------------------------------  Wound Assessment Details Patient Name: Date of Service: [name], [name] 07/03/2022 7:45 A M Medical Record Number: [id number] Patient Account Number: [id number] Date of Birth/Sex: Treating RN: [birth year] (72 y.o. [name] Primary Care Isao Seltzer: [name] Other Clinician: Referring Shenaya Lebo: Treating Rashan Patient/Extender: Treston, Coker ([id number]) [id number] Page 7 of 12 Weeks in Treatment: 63 Wound Status Wound Number: 18R Primary Diabetic Wound/Ulcer of the Lower Extremity Etiology: Wound Location: Left, Plantar Metatarsal head first Wound Open Wounding Event: Gradually Appeared Status: Date Acquired: 08/23/2020 Comorbid Glaucoma, Sleep Apnea, Hypertension, Peripheral Arterial Disease, Weeks Of Treatment: 63 History: Peripheral Venous Disease, Type II Diabetes, Gout, Osteoarthritis, Clustered Wound: No Neuropathy Photos Wound Measurements Length: (cm) 2.7 Width: (cm) 3.5 Depth: (cm) 0.1 Area: (cm) 7.422 Volume: (cm) 0.742 % Reduction in Area: 47.5% % Reduction in Volume: 73.8% Epithelialization: Small (1-33%) Tunneling: No Undermining: No Wound Description Classification: Grade 2 Wound Margin: Flat and Intact Exudate Amount: Medium Exudate Type: Serosanguineous Exudate Color: red, brown Foul Odor After Cleansing: No Slough/Fibrino No Wound Bed Granulation Amount: Small (1-33%) Exposed Structure Granulation Quality: Red Fascia Exposed: No Necrotic Amount: Large (67-100%) Fat Layer  (Subcutaneous Tissue) Exposed: Yes Necrotic Quality: Adherent Slough Tendon Exposed: No Muscle Exposed: No Joint Exposed: No Bone Exposed: No Periwound Skin Texture Texture Color No Abnormalities Noted: No No Abnormalities Noted: Yes Callus: Yes Temperature / Pain Temperature: No Abnormality Moisture No Abnormalities Noted: No Dry / Scaly: Yes Maceration: No Treatment Notes Wound #18R (Metatarsal head first) Wound Laterality: Plantar, Left Cleanser Soap and Water Discharge Instruction: May shower and wash wound with dial antibacterial soap and water prior to dressing change. Wound Cleanser Discharge Instruction: Cleanse the wound with wound cleanser prior to applying a clean dressing using gauze sponges, not tissue or cotton balls. Peri-Wound Care Topical Primary Dressing Sorbalgon AG Dressing 2x2 (in/in) Discharge Instruction: Apply to wound bed as instructed [name], [name] ([id number]) [id number] Page 8 of 12 Secondary Dressing Zetuvit Plus Silicone Border Dressing 4x4 (in/in) Discharge Instruction: Apply silicone border over primary dressing as directed. Secured With Compression Wrap Compression Stockings Environmental education officer) Signed: 07/03/2022 3:35:13 PM By: Adline Peals Entered By: Adline Peals on 07/03/2022 07:52:14 -------------------------------------------------------------------------------- Wound Assessment Details Patient Name: Date of Service: [name] Garfinkel. 07/03/2022 7:45 A M Medical Record Number: [id number] Patient Account Number: [id number] Date of Birth/Sex: Treating RN: Jun 24, 1951 (72 y.o. [name] Primary Care Mitzi Lilja: [name] Other Clinician: Referring Khaleef Ruby: Treating Madysyn Hanken/Extender: [name] in Treatment: 63 Wound Status Wound Number: 22 Primary Cyst Etiology: Wound Location: Left, Proximal, Lateral Lower Leg Wound Open Wounding Event:  Bump Status: Date Acquired: 06/03/2021 Comorbid Glaucoma, Sleep Apnea, Hypertension, Peripheral Arterial Disease, Weeks Of Treatment: 56 History: Peripheral Venous Disease, Type II Diabetes, Gout, Osteoarthritis, Clustered Wound: Yes Neuropathy Photos Wound Measurements Length: (cm) Width: (cm) Depth: (cm) Clustered Quantity: Area: (cm) Volume: (cm) 4.8 % Reduction in Area: -128.6% 1 % Reduction in Volume: 71.4% 0.1 Epithelialization: Medium (34-66%) 3 Tunneling: No 3.77 Undermining: No 0.377 Wound Description Classification: Full Thickness With Exposed Support Structures Wound Margin: Fibrotic scar, thickened scar Exudate Amount: Medium Exudate Type: Serosanguineous Exudate Color: red, brown Foul Odor After Cleansing: No Slough/Fibrino Yes Wound Bed Granulation Amount: Large (67-100%) Exposed Structure Granulation Quality: Red, Pink Fascia Exposed: No [name], [name] ([id number]) [id number] Page 9 of 12 Necrotic Amount: Small (1-33%) Fat Layer (Subcutaneous Tissue) Exposed: Yes Necrotic Quality: Eschar, Adherent Slough Tendon Exposed: No Muscle Exposed: No Joint Exposed:  No Bone Exposed: No Periwound Skin Texture Texture Color No Abnormalities Noted: No No Abnormalities Noted: No Scarring: Yes Hemosiderin Staining: Yes Moisture Temperature / Pain No Abnormalities Noted: No Temperature: No Abnormality Dry / Scaly: Yes Tenderness on Palpation: Yes Treatment Notes Wound #22 (Lower Leg) Wound Laterality: Left, Lateral, Proximal Cleanser Soap and Water Discharge Instruction: May shower and wash wound with dial antibacterial soap and water prior to dressing change. Wound Cleanser Discharge Instruction: Cleanse the wound with wound cleanser prior to applying a clean dressing using gauze sponges, not tissue or cotton balls. Peri-Wound Care Sween Lotion (Moisturizing lotion) Discharge Instruction: Apply moisturizing lotion to the  leg Topical Skintegrity Hydrogel 4 (oz) Discharge Instruction: Apply hydrogel as directed Primary Dressing Endoform 2x2 in Discharge Instruction: Moisten with Hydrogel or saline Secondary Dressing ABD Pad, 8x10 Discharge Instruction: Apply over primary dressing as directed. Secured With Elastic Bandage 4 inch (ACE bandage) Discharge Instruction: Secure with ACE bandage as directed. Compression Wrap Compression Stockings Add-Ons Electronic Signature(s) Signed: 07/03/2022 3:35:13 PM By: Adline Peals Entered By: Adline Peals on 07/03/2022 07:52:48 -------------------------------------------------------------------------------- Wound Assessment Details Patient Name: Date of Service: [name] Garfinkel. 07/03/2022 7:45 A M Medical Record Number: [id number] Patient Account Number: [id number] Date of Birth/Sex: Treating RN: Dec 30, 1950 (72 y.o. [name] Primary Care Sharyon Peitz: [name] Other Clinician: Referring Jewelz Kobus: Treating Mylia Pondexter/Extender: [name] in Treatment: 63 Wound Status Wound Number: 30 Primary Abrasion Etiology: Wound Location: Left, Anterior Lower Leg [name], [name] ([id number]) [id number] Page 10 of 12 Wound Open Wounding Event: Shear/Friction Status: Date Acquired: 06/21/2022 Comorbid Glaucoma, Sleep Apnea, Hypertension, Peripheral Arterial Disease, Weeks Of Treatment: 1 History: Peripheral Venous Disease, Type II Diabetes, Gout, Osteoarthritis, Clustered Wound: No Neuropathy Photos Wound Measurements Length: (cm) 0.3 Width: (cm) 0.3 Depth: (cm) 0.1 Area: (cm) 0.071 Volume: (cm) 0.007 % Reduction in Area: 81.9% % Reduction in Volume: 82.1% Epithelialization: Large (67-100%) Tunneling: No Undermining: No Wound Description Classification: Full Thickness Without Exposed Support Structures Wound Margin: Distinct, outline attached Exudate Amount: Medium Exudate Type:  Serous Exudate Color: amber Foul Odor After Cleansing: No Slough/Fibrino Yes Wound Bed Granulation Amount: Large (67-100%) Exposed Structure Granulation Quality: Red Fascia Exposed: No Necrotic Amount: Small (1-33%) Fat Layer (Subcutaneous Tissue) Exposed: Yes Necrotic Quality: Adherent Slough Tendon Exposed: No Muscle Exposed: No Joint Exposed: No Bone Exposed: No Periwound Skin Texture Texture Color No Abnormalities Noted: Yes No Abnormalities Noted: No Hemosiderin Staining: Yes Moisture No Abnormalities Noted: No Temperature / Pain Maceration: Yes Temperature: No Abnormality Treatment Notes Wound #30 (Lower Leg) Wound Laterality: Left, Anterior Cleanser Soap and Water Discharge Instruction: May shower and wash wound with dial antibacterial soap and water prior to dressing change. Wound Cleanser Discharge Instruction: Cleanse the wound with wound cleanser prior to applying a clean dressing using gauze sponges, not tissue or cotton balls. Peri-Wound Care Topical Primary Dressing Sorbalgon AG Dressing 2x2 (in/in) Discharge Instruction: Apply to wound bed as instructed Secondary Dressing Zetuvit Plus Silicone Border Dressing 4x4 (in/in) Discharge Instruction: Apply silicone border over primary dressing as directed. Secured With [name] ([id number]) [id number] Page 11 of 12 Compression Wrap Compression Stockings Add-Ons Electronic Signature(s) Signed: 07/03/2022 3:35:13 PM By: Adline Peals Entered By: Adline Peals on 07/03/2022 07:53:11 -------------------------------------------------------------------------------- Wound Assessment Details Patient Name: Date of Service: [name], [name] 07/03/2022 7:45 A M Medical Record Number: [id number] Patient Account Number: [id number] Date of Birth/Sex: Treating RN: 1950/10/07 (72 y.o. [name] Primary Care Marjorie Lussier: [name] Other Clinician: Referring  Samhitha Rosen: Treating Archer Moist/Extender: [name] in Treatment: 63 Wound Status Wound Number: 68 Primary T be determined o Etiology: Wound Location: Left, Medial Lower Leg Wound Open Wounding Event: Gradually Appeared Status: Date Acquired: 06/28/2022 Comorbid Glaucoma, Sleep Apnea, Hypertension, Peripheral Arterial Disease, Weeks Of Treatment: 0 History: Peripheral Venous Disease, Type II Diabetes, Gout, Osteoarthritis, Clustered Wound: No Neuropathy Photos Wound Measurements Length: (cm) 0.3 Width: (cm) 0.3 Depth: (cm) 0.1 Area: (cm) 0.071 Volume: (cm) 0.007 % Reduction in Area: 77.4% % Reduction in Volume: 77.4% Epithelialization: Large (67-100%) Tunneling: No Undermining: No Wound Description Classification: Full Thickness Without Exposed Support Structures Wound Margin: Distinct, outline attached Exudate Amount: Medium Exudate Type: Serous Exudate Color: amber Foul Odor After Cleansing: No Slough/Fibrino Yes Wound Bed Granulation Amount: Large (67-100%) Exposed Structure Granulation Quality: Red Fascia Exposed: No Necrotic Amount: Small (1-33%) Fat Layer (Subcutaneous Tissue) Exposed: Yes Necrotic Quality: Eschar, Adherent Slough Tendon Exposed: No Muscle Exposed: No Joint Exposed: No Bone Exposed: No [address] [name], LUCE ([id number]) [id number] Page 12 of 12 No Abnormalities Noted: No No Abnormalities Noted: No Scarring: Yes Hemosiderin Staining: Yes Moisture Temperature / Pain No Abnormalities Noted: No Temperature: No Abnormality Dry / Scaly: Yes Treatment Notes Wound #31 (Lower Leg) Wound Laterality: Left, Medial Cleanser Soap and Water Discharge Instruction: May shower and wash wound with dial antibacterial soap and water prior to dressing change. Wound Cleanser Discharge Instruction: Cleanse the wound with wound cleanser prior to applying a clean dressing using  gauze sponges, not tissue or cotton balls. Peri-Wound Care Topical Primary Dressing Sorbalgon AG Dressing 2x2 (in/in) Discharge Instruction: Apply to wound bed as instructed Secondary Dressing Zetuvit Plus Silicone Border Dressing 4x4 (in/in) Discharge Instruction: Apply silicone border over primary dressing as directed. Secured With Compression Wrap Compression Stockings Environmental education officer) Signed: 07/03/2022 3:35:13 PM By: Adline Peals Entered By: Adline Peals on 07/03/2022 07:53:33 -------------------------------------------------------------------------------- Vitals Details Patient Name: Date of Service: [name] Garfinkel. 07/03/2022 7:45 A M Medical Record Number: [id number] Patient Account Number: [id number] Date of Birth/Sex: Treating RN: May 18, 1951 (72 y.o. [name] Primary Care Ell Tiso: [name] Other Clinician: Referring Kolt Mcwhirter: Treating Yianni Skilling/Extender: [name] in Treatment: 68 Vital Signs Time Taken: 07:43 Temperature (F): 98.3 Height (in): 74 Pulse (bpm): 70 Weight (lbs): 238 Respiratory Rate (breaths/min): 18 Body Mass Index (BMI): 30.6 Blood Pressure (mmHg): 147/69 Reference Range: 80 - 120 mg / dl Electronic Signature(s) Signed: 07/03/2022 3:35:13 PM By: Adline Peals Entered By: Adline Peals on 07/03/2022 07:43:56

## 2022-07-03 NOTE — Progress Notes (Signed)
IMRI, LOR ([id number]) [id number] Page 1 of 21 Visit Report for 07/03/2022 Chief Complaint Document Details Patient Name: Date of Service: [name], [name] 07/03/2022 7:45 A M Medical Record Number: [id number] Patient Account Number: [id number] Date of Birth/Sex: Treating RN: January [birth year], [name] (72 y.o. M) Primary Care Provider: [name] Other Clinician: Referring Provider: Treating Provider/Extender: [name] in Treatment: 63 Information Obtained from: Patient Chief Complaint Left leg and foot ulcers 04/12/2021; patient is here for wounds on his left lower leg and left plantar foot over the first metatarsal head Electronic Signature(s) Signed: 07/03/2022 8:07:20 AM By: Fredirick Maudlin MD FACS Entered By: Fredirick Maudlin on 07/03/2022 08:07:20 -------------------------------------------------------------------------------- Debridement Details Patient Name: Date of Service: [name]. 07/03/2022 7:45 A M Medical Record Number: [id number] Patient Account Number: [id number] Date of Birth/Sex: Treating RN: [name][birth year] (72 y.o. [name] Primary Care Provider: [name] Other Clinician: Referring Provider: Treating Provider/Extender: [name] in Treatment: 63 Debridement Performed for Assessment: Wound #30 Left,Anterior Lower Leg Performed By: Physician Fredirick Maudlin, MD Debridement Type: Debridement Level of Consciousness (Pre-procedure): Awake and Alert Pre-procedure Verification/Time Out Yes - 07:57 Taken: Start Time: 07:57 Pain Control: Lidocaine 4% T opical Solution T Area Debrided (L x W): otal 0.3 (cm) x 0.3 (cm) = 0.09 (cm) Tissue and other material debrided: Non-Viable, Eschar, Slough, Slough Level: Non-Viable Tissue Debridement Description: Selective/Open Wound Instrument: Curette Bleeding: Minimum Hemostasis Achieved: Pressure Response to Treatment: Procedure was  tolerated well Level of Consciousness (Post- Awake and Alert procedure): Post Debridement Measurements of Total Wound Length: (cm) 0.3 Width: (cm) 0.3 Depth: (cm) 0.1 Volume: (cm) 0.007 Character of Wound/Ulcer Post Debridement: Improved Post Procedure Diagnosis Same as [name] ([id number]) [id number] Page 2 of 21 Notes scribed for Dr. Celine Ahr by Adline Peals, RN Electronic Signature(s) Signed: 07/03/2022 11:56:27 AM By: Fredirick Maudlin MD FACS Signed: 07/03/2022 3:35:13 PM By: Sabas Sous By: Adline Peals on 07/03/2022 08:00:21 -------------------------------------------------------------------------------- Debridement Details Patient Name: Date of Service: [name]. 07/03/2022 7:45 A M Medical Record Number: [id number] Patient Account Number: [id number] Date of Birth/Sex: Treating RN: [name]/[birth year]/[name] (72 y.o. [name] Primary Care Provider: [name] Other Clinician: Referring Provider: Treating Provider/Extender: [name] in Treatment: 63 Debridement Performed for Assessment: Wound #22 Left,Proximal,Lateral Lower Leg Performed By: Physician Fredirick Maudlin, MD Debridement Type: Debridement Level of Consciousness (Pre-procedure): Awake and Alert Pre-procedure Verification/Time Out Yes - 07:57 Taken: Start Time: 07:57 Pain Control: Lidocaine 4% T opical Solution T Area Debrided (L x W): otal 4.8 (cm) x 1 (cm) = 4.8 (cm) Tissue and other material debrided: Non-Viable, Slough, Slough Level: Non-Viable Tissue Debridement Description: Selective/Open Wound Instrument: Curette Bleeding: Minimum Hemostasis Achieved: Pressure Response to Treatment: Procedure was tolerated well Level of Consciousness (Post- Awake and Alert procedure): Post Debridement Measurements of Total Wound Length: (cm) 4.8 Width: (cm) 1 Depth: (cm) 0.1 Volume: (cm) 0.377 Character of  Wound/Ulcer Post Debridement: Improved Post Procedure Diagnosis Same as Pre-procedure Notes scribed for Dr. Celine Ahr by Adline Peals, RN Electronic Signature(s) Signed: 07/03/2022 11:56:27 AM By: Fredirick Maudlin MD FACS Signed: 07/03/2022 3:35:13 PM By: Adline Peals Entered By: Adline Peals on 07/03/2022 08:01:47 -------------------------------------------------------------------------------- Debridement Details Patient Name: Date of Service: [name]. 07/03/2022 7:45 A M Medical Record Number: [id number] Patient Account Number: [id number] Date of Birth/Sex: Treating RN: [name], [name] (72 y.o. [name] Primary Care Provider: [name] Other Clinician: [name] ([id number]) [id number] Page 3  of 21 Referring Provider: Treating Provider/Extender: [name] in Treatment: 63 Debridement Performed for Assessment: Wound #31 Left,Medial Lower Leg Performed By: Physician Fredirick Maudlin, MD Debridement Type: Debridement Level of Consciousness (Pre-procedure): Awake and Alert Pre-procedure Verification/Time Out Yes - 07:57 Taken: Start Time: 07:57 Pain Control: Lidocaine 4% T opical Solution T Area Debrided (L x W): otal 0.3 (cm) x 0.3 (cm) = 0.09 (cm) Tissue and other material debrided: Non-Viable, Eschar Level: Non-Viable Tissue Debridement Description: Selective/Open Wound Instrument: Curette Bleeding: Minimum Hemostasis Achieved: Pressure Response to Treatment: Procedure was tolerated well Level of Consciousness (Post- Awake and Alert procedure): Post Debridement Measurements of Total Wound Length: (cm) 0.3 Width: (cm) 0.3 Depth: (cm) 0.1 Volume: (cm) 0.007 Character of Wound/Ulcer Post Debridement: Improved Post Procedure Diagnosis Same as Pre-procedure Notes scribed for Dr. Celine Ahr by Adline Peals, RN Electronic Signature(s) Signed: 07/03/2022 11:56:27 AM By: Fredirick Maudlin  MD FACS Signed: 07/03/2022 3:35:13 PM By: Adline Peals Entered By: Adline Peals on 07/03/2022 08:02:37 -------------------------------------------------------------------------------- HPI Details Patient Name: Date of Service: [name]. 07/03/2022 7:45 A M Medical Record Number: [id number] Patient Account Number: [id number] Date of Birth/Sex: Treating RN: [name]-[birth year] (72 y.o. M) Primary Care Provider: [name] Other Clinician: Referring Provider: Treating Provider/Extender: [name] in Treatment: 22 History of Present Illness HPI Description: 10/11/17; Mr. Fentress is a 72 year old man who tells me that in 2015 he slipped down the latter traumatizing his left leg. He developed a wound in the same spot the area that we are currently looking at. He [name] this closed over for the most part although he always felt it was somewhat unstable. In 2016 he hit the same area with the door of his car had this reopened. He tells me that this is never really closed although sometimes an inflow it remains open on a constant basis. He has not been using any specific dressing to this except for topical antibiotics the nature of which were not really sure. His primary doctor did send him to see Dr. [name] of interventional cardiology. He underwent an angiogram on 08/06/17 and he underwent a PTA and directional atherectomy of the lesser distal SFA and popliteal arteries which resulted in brisk improvement in blood flow. It was noted that he had 2 vessel runoff through the anterior tibial and peroneal. He is also been to see vascular and interventional radiologist. He was not felt to have any significant superficial venous insufficiency. Presumably is not a candidate for any ablation. It was suggested he come here for wound care. The patient is a type II diabetic on insulin. He also has a history of venous insufficiency. ABIs on the left were noncompressible in our  clinic 10/21/17; patient we admitted to the clinic last week. 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 We put Iodosorb on him after an aggressive debridement and 3 layer compression. He complained of pain in his ankle and itching with is skin in fact he scratched the area on the medial calf superiorly at the rim of our wraps and he has 2 small open areas in that location today which are new. I changed his primary dressing today to silver collagen. As noted he is already had revascularization and does not have any significant superficial venous insufficiency that would be amenable to ablation [name], [name] ([id number]) [id number] Page 4 of 21 10/28/17; patient admitted to the clinic 2 weeks ago. He  has a smaller Wound. Scratch injury from last week revealed. There is large wound over the tibial area. This is smaller. Granulation looks healthy. No need for debridement. 11/04/17; the wound on the left lateral calf looks better. Improved dimensions. Surface of this looks better. We've been maintaining him and Kerlix Coban wraps. He finds this much more comfortable. Silver collagen dressing 11/11/17; left lateral Wound continues to look healthy be making progress. Using a #5 curet I removed removed nonviable skin from the surface of the wound and then necrotic debris from the wound surface. Surface of the wound continues to look healthy. He also has an open area on the left great toenail bed. We've been using topical antibiotics. 11/19/17; left anterior lateral wound continues to look healthy but it's not closed. He also had a small wound above this on the left leg Initially traumatic wounds in the setting of significant chronic venous insufficiency and stasis dermatitis 11/25/17; left anterior wounds superiorly is closed still a small wound inferiorly. 12/02/17; left anterior tibial area. Arrives today with adherent  callus. Post debridement clearly not completely closed. Hydrofera Blue under 3 layer compression. 12/09/17; left anterior tibia. Circumferential eschar however the wound bed looks stable to improved. We've been using Hydrofera Blue under 3 layer compression 12/17/17; left anterior tibia. Apparently this was felt to be closed however when the wrap was taken off there is a skin tear to reopen wounds in the same area we've been using Hydrofera Blue under 3 layer compression 12/23/17 left anterior tibia. Not close to close this week apparently the [facility] was stuck to this again. Still circumferential eschar requiring debridement. I put a contact layer on this this time under the Hydrofera Blue 12/31/17; left anterior tibia. Wound is better slight amount of hyper-granulation. Using Hydrofera Blue over Adaptic. 01/07/18; left anterior tibia. The wound had some surface eschar however after this was removed he has no open wound.he was already revascularized by Dr. [name] when he came to our clinic with atherectomy of the left SFA and popliteal artery. He was also sent to interventional radiology for venous reflux studies. He was not felt to have significant reflux but certainly has chronic venous changes of his skin with hemosiderin deposition around this area. He will definitely need to lubricate his skin and wear compression stocking and I've talked to him about this. READMISSION 05/26/2018 This is a now 72 year old man we cared for with traumatic wounds on his left anterior lower extremity. He had been previously revascularized during that admission by Dr. [name]. Apparently in follow-up Dr. [name] noted that he had deterioration in his arterial status. He underwent a stent placement in the distal left SFA on 04/22/2018. 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 He went back to the angiography suite on 04/30/2018 he underwent PTA and balloon angioplasty of the occluded left mid anterior  tibial artery, thrombotic occlusion went from 100 to 0% which reconstitutes the posterior tibial artery. He had thrombectomy and aspiration of the peroneal artery. The stent placed in the distal SFA left SFA was still occluded. 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 He has been recuperating at [place] [facility] in [facility] after the most recent hospitalization. He is going home tomorrow. He  tells me that 2 weeks ago he traumatized the tip of his left fifth toe. He came in urgently for our review of this. This was a history of before I noted that Dr. [name] had already noted dry gangrenous changes of the left fifth toe 06/09/2018; 2-week follow-up. I did contact Dr. [name] after his last appointment and he apparently saw 1 of Dr. Irven Shelling colleagues the next day. He does not follow-up with Dr. [name] himself until Thursday of this week. 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 Nevertheless there is no evidence of infection no drainage and no pain. He had a new area that this week when we were signing him in today on the left anterior mid tibia area, this is in close proximity to the previous wound we have dealt with in this clinic. 06/23/2018; 2-week follow-up. I did not receive a recent note from Dr. [name] to review today. Our office is trying to obtain this. He is apparently not planning to do further vascular interventions and wondered about compression to try and help with the patient's chronic venous insufficiency. However we are also concerned about the arterial flow. He arrives in clinic today with a new area on the left third toe. The areas on the calf/anterior tibia are close to closing. The left fifth toe is still  mummified using Betadine. -In reviewing things with the patient he has what sounds like claudication with mild to moderate amount of activity. 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 I prescribed Levaquin over the phone while we attempted to arrange a plan of care. However the patient called yesterday to report he had low-grade fever and he came in today acutely. There is been a marked deterioration in the left third toe with spreading cellulitis up into the dorsal left foot. He was referred to the emergency room. Readmission: 06/29/2020 patient presents today for reevaluation here in our clinic he was previously treated by Dr. Dellia Nims at the latter part of 2019 in 2 the beginning of 2020. Subsequently we have not seen him since that time in the interim he did have evaluation with vein and vascular specialist specifically Dr. Anice Paganini who did perform quite extensive work for a left femoral to anterior tibial artery bypass. With that being said in the interim the patient has developed significant lymphedema and has wounds that he tells me have really never healed in regard to the incision site on the left leg. He also has multiple wounds on the feet for various reasons some of which is that he tends to pick at his feet. Fortunately there is no signs of active infection systemically at this time he does have some wounds that are little bit deeper but most are fairly superficial he seems to have good blood flow and overall everything appears to be healthy I see no bone exposed and no obvious signs of osteomyelitis. I do not know that he necessarily needs a x-ray at this point although that something we could consider depending on how things progress. The patient does have a history of lymphedema, diabetes, this is type II, chronic kidney disease stage III, hypertension, and history of peripheral vascular disease. 07/05/2020; patient admitted last week. Is a patient I  remember from 2019 he had a spreading infection involving the left foot and we sent him to the hospital. 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  He subsequently had a left femoral to anterior tibial bypass by Dr.Cain vein and vascular. He also has severe lymphedema with chronic skin changes related to that on the left leg. The most problematic area that was new today was on the left medial great toe. This was apparently a small area last week there was purulent drainage which our intake nurse cultured. Also areas on the left medial foot and heel left lateral foot. He has 2 areas on the left medial calf left lateral calf in the setting of the severe lymphedema. 07/13/2020 on evaluation today patient appears to be doing better in my opinion compared to his last visit. The good news is there is no signs of active infection systemically and locally I do not see any signs of infection either. He did have an x-ray which was negative that is great news he had a culture which showed MRSA but at the same time he is been on the doxycycline which has helped. I do think we may want to extend this for 7 additional days 1/25; patient admitted to the clinic a few weeks ago. 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 We have been putting him under compression his edema control is a lot better but he is severe verricused skin on the left leg. He is really done quite well he still has an open area on the left medial calf and the left medial first metatarsal head. We have been using silver collagen on the leg silver alginate on the foot 07/27/2020 upon evaluation today patient appears to be doing decently well in regard to his wounds. He still has a lot of dry skin on the left leg. Some of this is starting to peel back and I think he may be able to have them out by removing some that today. Fortunately there is no signs of active infection  at this time on the left leg although on the right leg he does appear to have swelling and erythema as well as some mild warmth to touch. This does have been concerned about the possibility of cellulitis although within the differential diagnosis I do think that potentially a DVT has to be at least considered. We need to rule that out before proceeding would just call in the cellulitis. Especially since he is having pain in the posterior aspect of his calf muscle. 2/8; the patient had seen sparingly. He has severe skin changes of chronic lymphedema in the left leg thickened hyperkeratotic verrucous skin. He has an open wound on the medial part of the left first met head left mid tibia. He also has a rim of nonepithelialized skin in the anterior mid tibia. He brought in the AmLactin lotion that was been prescribed although I am not sure under compression and its utility. There concern about cellulitis on the right lower leg the last time he was here. He was put on on antibiotics. His DVT rule out was negative. The right leg looks fine he is using his stocking on this area 08/10/2020 upon evaluation today patient appears to be doing well with regard to his leg currently. He has been tolerating the dressing changes without [name], [name] ([id number]) [id number] Page 5 of 21 complication. Fortunately there is no signs of active infection which is great news. Overall very pleased with where things stand. 2/22; the patient still has an area on the medial part of the left first met his head. This looks better than  when I last saw this earlier this month he has a rim of epithelialization but still some surface debris. Mostly everything on the left leg is healed. There is still a vulnerable in the left mid tibia area. 08/30/2020 upon evaluation today patient appears to be doing much better in regard to his wounds on his foot. Fortunately there does not appear to be any signs of active  infection systemically though locally we did culture this last week and it does appear that he does have MRSA currently. Nonetheless I think we will address that today I Minna send in a prescription for him in that regard. Overall though there does not appear to be any signs of significant worsening. 09/07/2020 on evaluation today patient's wounds over his left foot appear to be doing excellent. I do not see any signs of infection there is some callus buildup this can require debridement for certain but overall I feel like he is managing quite nicely. He still using the AmLactin cream which has been beneficial for him as well. 3/22; left foot wound is closed. There is no open area here. He is using ammonium lactate lotion to the lower extremities to help exfoliate dry cracked skin. He has compression stockings from [facility] in [place]. The wound on the medial part of his left first met head is healed today. READMISSION 04/12/2021 Mr. Stanhope is a patient we know fairly well he had a prolonged stay in clinic in 2019 with wounds on his left lateral and left anterior lower extremity in the setting of chronic venous insufficiency. More recently he was here earlier this year with predominantly an area on his left foot first metatarsal head plantar and he says the plantar foot broke down on its not long after we discharged him but he did not come back here. The last few months areas of broken down on his left anterior and again the left lateral lower extremity. The leg itself is very swollen chronically enlarged a lot of hyperkeratotic dry Berry Q skin in the left lower leg. His edema extends well into the thigh. He was seen by Dr. Donzetta Matters. He had ABIs on 03/02/2021 showing an ABI on the right of 1 with a TBI of 0.72 his ABI in the left at 1.09 TBI of 0.99. Monophasic and biphasic waveforms on the right. On the left monophasic waveforms were noted he went on to have an angiogram on 03/27/2021 this showed the  aortic aortic and iliac segments were free of flow-limiting stenosis the left common femoral vein to evaluate the left femoral to anterior tibial artery bypass was unobstructed the bypass was patent without any areas of stenosis. We discharged the patient in bilateral juxta lite stockings but very clearly that was not sufficient to control the swelling and maintain skin integrity. He is clearly going to need compression pumps. 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 This is fortunate because he is on his feet for long periods of time. 10/27; patient comes in with our intake nurse reporting copious amount of green drainage from the left anterior mid tibia the left dorsal foot and to a lesser extent the left medial mid tibia. We left the compression wrap on all week for the amount of edema in his left leg is quite a bit better. We use silver alginate as the primary dressing 11/3; edema control is good. Left anterior lower leg left medial lower  leg and the plantar first metatarsal head. The left anterior lower leg required debridement. Deep tissue culture I did of this wound showed MRSA I put him on 10 days of doxycycline which she will start today. We have him in compression wraps. He has a security card and [employer] however he is retiring on November 15. We will need to then get him into a better offloading boot for the left foot perhaps a total contact cast 11/10; edema control is quite good. Left anterior and left medial lower leg wounds in the setting of chronic venous insufficiency and lymphedema. He also has a substantial area over the left plantar first metatarsal head. 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 He has significant hypergranulation on the left plantar foot wound. The patient is a diabetic but he does not have significant PAD 11/17;  edema control is quite good. Left anterior and left medial lower leg wounds look better. The really concerning area remains the area on the left plantar first metatarsal head. He has a rim of epithelialization. 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 Starting today with a forefoot off loader but . possibly a total contact cast. 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 Arrives with a very significant odor from I think all of the wounds on the left leg and the left foot. Because of this I did not put a total contact cast on him today but will could still consider this. His wife was having cataract surgery which is the reason he missed the appointment 12/6. I saw this man 5 days ago with a swelling below the popliteal fossa. I thought he actually might have a Baker's cyst however the DVT rule out study that we could arrange right away was negative the technician told me this was not a ruptured Baker's cyst. We attempted to get this aspirated by under ultrasound guidance in interventional radiology however all they did was an ultrasound however it shows an extensive fluid collection 62 x 8 x 9.4 in the left thigh and left calf. The patient [name] he thinks this started 8 days ago or so but he really is not complaining of any pain, fever or systemic symptoms. He has not ha 12/20; after some difficulty I managed to get the patient into see Dr. Donzetta Matters. Eventually he was taken into the hospital and had a drain put in the fluid collection below his left knee posteriorly extending into the posterior thigh. He still has the drain in place. Culture of this showed moderate staff aureus few Morganella and few Klebsiella he is now on doxycycline and ciprofloxacin as suggested by infectious  disease he is on this for a month. The drain will remain in place until it stops draining 12/29; he comes in today with the 1 wound on his left leg and the area on the left plantar first met head significantly smaller. Both look healthy. He still has the drain in the left leg. He says he has to change this daily. Follows up with Dr. Donzetta Matters on January 11. 06/29/2021; the wounds that I am following on the left leg and left first met  head continued to be quite healthy. However the area where his inferior drain is in place had copious amounts of drainage which was green in color. The wound here is larger. Follows up with Dr. Gwenlyn Saran of vein and vascular his surgeon next week as well as infectious disease. He remains on ciprofloxacin and doxycycline. He is not complaining of excessive pain in either one of the drain areas 1/12; the patient saw vascular surgery and infectious disease. Vascular surgery has left the drain in place as there was still some notable drainage still see him back in 2 weeks. Dr. Velna Ochs stop the doxycycline and ciprofloxacin and I do not believe he follows up with them at this point. Culture I did last week showed both doxycycline resistant MRSA and Pseudomonas not sensitive to ciprofloxacin although only in rare titers 1/19; the patient's wound on the left anterior lower leg is just about healed. We have continued healing of the area that was medially on the left leg. Left first plantar metatarsal head continues to get smaller. The major problem here is his 2 drain sites 1 on the left upper calf and lateral thigh. There is purulent drainage still from the left lateral thigh. I gave him antibiotics last week but we still have recultured. He has the drain in the area I think this is eventually going to have to come out. I suspect there will be a connecting wound to heal here perhaps with improved VAc 1/26; the patient had his drain removed by vein and vascular on 1/25/. This was a large  pocket of fluid in his left thigh that seem to tunnel into his left upper calf. 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 His mention his Penrose drain was removed today. He now has a tunneling wound on his left calf and left thigh. Both of these probe widely towards each other although I cannot really prove that they connect. Both wounds on his lower leg anteriorly are closed and his area over the first metatarsal head on his right foot continues to improve. We are using Hydrofera Blue here. He also saw infectious disease culture of the abscess they noted was polymicrobial with MRSA, Morganella and Klebsiella he was treated with doxycycline and ciprofloxacin for 4 weeks ending on 07/03/2021. They did not recommend any further antibiotics. Notable that while he still had the Penrose drain in place last week he had purulent drainage coming out of the inferior IandD site this grew [facility], MRSA and Pseudomonas but there does not appear to be any [name], [name] ([id number]) [id number] Page 6 of 21 active infection in this area today with the drain out and he is not systemically unwell 2/2; with regards to the drain sites the superior one on the thigh actually is closed down the one on the upper left lateral calf measures about 8 and half centimeters which is an improvement seems to be less prominent although still with a lot of drainage. The only remaining wound is over the first metatarsal head on the left foot and this looks to be continuing to improve with Hydrofera Blue. 2/9; the area on his plantar left foot continues to contract. Callus around the wound edge. The drain sites specifically have not come down in depth. We put the wound VAC on Monday he changed the canister late last night our intake nurse reported a pocket of fluid perhaps caused by our compression wraps 2/16; continued improvement in left foot plantar wound. drainage site  in the  calf is not improved in terms of depth (wound vac) 2/23; continued improvement in the left foot wound over the first metatarsal head. With regards to the drain sites the area on his thigh laterally is healed however the open area on his calf is small in terms of circumference by still probes in by about 15 cm. Within using the wound VAC. Hydrofera Blue on his foot 08/24/2021: The left first metatarsal head wound continues to improve. The wound bed is healthy with just some surrounding callus. Unfortunately the open drain site on his calf remains open and tunnels at least 15 cm (the extent of a Q-tip). This is despite several weeks of wound VAC treatment. Based on reading back through the notes, there has been really no significant change in the depth of the wound, although the orifice is smaller and the more cranial wound on his thigh has closed. I suspect the tunnel tracks nearly all the way to this location. 08/31/2021: Continued improvement in the left first metatarsal head wound. There has been absolutely no improvement to the long tunnel from his open drain site on his calf. We have tried to get him into see vascular surgery sooner to consider the possibility of simply filleting the tract open and allowing it to heal from the bottom up, likely with a wound VAC. They have not yet scheduled a sooner appointment than his current mid April 09/14/2021: He was seen by vascular surgery and they took him to the operating room last week. They opened a portion of the tunnel, but did not extend the entire length of the known open subcutaneous tract. I read Dr. Claretha Cooper operative note and it is not clear from that documentation why only a portion of the tract was opened. The heaped up granulation tissue was curetted and removed from at least some portion of the tract. They did place a wound VAC and applied an Unna boot to the leg. The ulcer on his left first metatarsal head is smaller today. The bed looks good and  there is just a small amount of surrounding callus. 09/21/2021: The ulcer on his left first metatarsal head looks to be stalled. There is some callus surrounding the wound but the wound bed itself does not appear particularly dynamic. The tunnel tract on his lateral left leg seems to be roughly the same length or perhaps slightly smaller but the wound bed appears healthy with good granulation tissue. He opened up a new wound on his medial thigh and the site of a prior surgical incision. He says that he did this unconsciously in his sleep by scratching. 09/28/2021: Unfortunately, the ulcer on his left first metatarsal head has extended underneath the callus toward the dorsum of his foot. The medial thigh wounds are roughly the same. The tunnel on his lateral left leg continues to be problematic; it is longer than we are able to actually probe with a Q-tip. I am still not certain as to why Dr. Donzetta Matters did not open this up entirely when he took the patient to the operating room. We will likely be back in the same situation with just a small superficial opening in a long unhealed tract, as the open portion is granulating in nicely. 10/02/2021: The patient was initially scheduled for a nurse visit, but we are also applying a total contact cast today. The plantar foot wound looks clean without significant accumulated callus. We have been applying Prisma silver collagen to the site. 10/05/2021: The patient is  here for his first total contact cast change. We have tried using gauze packing strips in the tunnel on his lateral leg wound, but this does not seem to be working any better than the white VAC foam. The foot ulcer looks about the same with minimal periwound callus. Medial thigh wound is clean with just some overlying eschar. 10/12/2021: The plantar foot wound is stable without any significant accumulation of periwound callus. The surface is viable with good granulation tissue. The medial thigh wounds are much  smaller and are epithelializing. On the other hand, he had purulent drainage coming from the tunnel on his lateral leg. He does go back to see Dr. Donzetta Matters next week and is planning to ask him why the wound tunnel was not completely opened at the time of his most recent operation. 10/19/2021: The plantar foot wound is markedly improved and has epithelial tissue coming through the surface. The medial thigh wounds are nearly closed with just a tiny open area. He did see Dr. Donzetta Matters earlier this week and apparently they did discuss the possibility of opening the sinus tract further and enabling a wound VAC application. Apparently there are some limits as to what Dr. 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 I think if we could get the tract open to the level of the popliteal fossa, this would greatly aid in her ability to get this chart closed. That being said, however, today when I probed the tract with a Q-tip, I was not able to insert the entirety of the Q-tip as I have on previous occasions. The tunnel is shorter by about 4 cm. The surface is clean with good granulation tissue and no further episodes of purulent drainage. 10/30/2021: Last week, the patient underwent surgery and had the long tract in his leg opened. There was a rind that was debrided, according to the operative report. His medial thigh ulcers are closed. The plantar foot wound is clean with a good surface and some built up surrounding callus. 11/06/2021: The overall dimensions of the large wound on his lateral leg remain about the same, but there is good granulation tissue present and the tunneling is a little bit shorter. He has a new wound on his anterior tibial surface, in the same location where he had a similar lesion in the past. The plantar foot wound is clean with some buildup surrounding callus. Just toward the medial aspect of his foot, however, there is an area of darkening that once debrided,  revealed another opening in the skin surface. 11/13/2021: The anterior tibial surface wound is closed. The plantar foot wound has some surrounding callus buildup. The area of darkening that I debrided last week and revealed an opening in the skin surface has closed again. The tunnel in the large wound on his lateral leg has come in by about 3 cm. There is healthy granulation tissue on the entire wound surface. 11/23/2021: The patient was out of town last week and did wet-to-dry dressings on his large wound. 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 Unfortunately, he picked open the wound on his left medial thigh. He says that it was itching and he just could not stop scratching it until it was open again. The wound on his plantar foot is smaller and has not accumulated a tremendous amount of callus. The lateral leg wound is shallower and the tunnel has also decreased in depth. There  is just a little bit of slough accumulation on the surface. 11/30/2021: Another portion of his left medial thigh has been opened up. All of these wounds are fairly superficial with just a little bit of slough and eschar accumulation. The wound on his plantar foot is almost closed with just a bit of eschar and periwound callus accumulation. The lateral leg wound is nearly flush with the surrounding skin and the tunnel is markedly shallower. 12/07/2021: There is just 1 open area on his left medial thigh. It is clean with just a little bit of perimeter eschar. The wound on his plantar foot continues to contract and just has some eschar and periwound callus accumulation. The lateral leg wound is closing at the more distal aspect and the tunnel is smaller. The surface is nearly flush with the surrounding skin and it has a good bed of granulation tissue. 12/14/2021: The thigh and foot wounds are closed. The lateral leg wound has closed over approximately half of its  length. The tunnel continues to contract and the surface is now flush with the surrounding skin. The wound bed has robust granulation tissue. 12/22/2021: The thigh and foot wounds have reopened. The foot wound has a lot of callus accumulation around and over it. The thigh wound is tiny with just a little bit of slough in the wound bed. The lateral leg wound continues to contract. His vascular surgeon took the wound VAC off earlier in the week and the patient has been doing wet-to-dry dressings. There is a little slough accumulation on the surface. The tunnel is about 3 cm in depth at this point. 12/28/2021: The thigh wound is closed again. The foot wound has some callus that subsequently has peeled back exposing just a small slit of a wound. The lateral leg wound Is down to about half the size that it originally was and the tunnel is down to about half a centimeter in depth. 01/04/2022: The thigh wound remains closed. The foot wound has heavy callus overlying the wound site. Once this was debrided, the wound was found to be BENEDETTO, [name] ([id number]) [id number] Page 7 of 21 closed. The lateral leg wound is smaller again this week and very superficial. No tunnel could be identified. 01/12/2022: The thigh and foot wounds both remain closed. The lateral leg wound is now nearly flush with the skin surface. There is good granulation tissue present with a light layer of slough. 01/19/2022: Due to the way his wrap was placed, the patient did not change the dressing on his thigh at all and so the foam was saturated and his skin is macerated. There is a light layer of slough on the wound surface. The underlying granulation tissue is robust and healthy-appearing. He has heavy callus buildup at the site of his first metatarsal head wound which is still healed. 02/01/2022: He has been in silver alginate. When he removed the dressing from his thigh wound, however, some leg, superficially  reopening a portion of the wound that had healed. In addition, underneath the callus at his left first metatarsal head, there appears to be a blister and the wound appears to be open again. 02/08/2022: The lateral leg wound has contracted substantially. There is eschar and a light layer of slough present. He says that it is starting to pull and is uncomfortable. On inspection, there is some puckering of the scar and the eschar is quite dry; this may account for his symptoms. On his first metatarsal head, the wound is much  smaller with just some eschar on the surface. The callus has not reaccumulated. He reports that he had a blister come up on his medial thigh wound at the distal aspect. It popped and there is now an opening in his skin again. Looking back through his [place] of wound photos, there is what looks like a permanent suture just deep to this location and it may be trying to erode through. We have been using silver alginate on his wounds. 02/15/2022: The lateral leg wound is about half the size it was last week. It is clean with just a little perimeter eschar and light slough. The wound on his first metatarsal head is about the same with heavy callus overlying it. The medial thigh wound is closed again. He does have some skin changes on the top of his foot that looks potentially yeast related. 02/22/2022: The skin on the top of his foot improved with the use of a topical antifungal. The lateral leg wound continues to contract and is again smaller this week. There is a little bit of slough and eschar on the surface. The first metatarsal head wound is a little bit smaller but has reaccumulated a thick callus over the top. He decided to try to trim his toenail and ultimately took the entire nail off of his left great toe. 03/02/2022: His lateral leg wound continues to improve, as does the wound on his left great toe. Unfortunately, it appears that somehow his foot got wet and moisture seeped in  through the opening causing his skin to lift. There is a large wound now overlying his first metatarsal on both the plantar, medial, and dorsal portion of his foot. There is necrotic tissue and slough present underneath the shaggy macerated skin. 03/08/2022: The lateral leg wound is smaller again today. There is just a light layer of slough and eschar on the surface. The great toe wound is smaller again today. The first metatarsal wound is a little bit smaller today and does not look nearly as necrotic and macerated. There is still slough and nonviable tissue present. 03/15/2022: The lateral leg wound is narrower and just has a little bit of light slough buildup. The first metatarsal wound still has a fair amount of moisture affecting the periwound skin. The great toe wound is healed. 03/22/2022: The lateral leg wound is now isolated to just at the level of his knee. There is some eschar and slough accumulation. The first metatarsal head wound has epithelialized tremendously and is about half the size that it was last week. He still has some maceration on the top of his foot and a fungal odor is present. 03/29/2022: T oday the patient's foot was macerated, suggesting that the cast got wet. The patient has also been picking at his dry skin and has enlarged the wound on his left lateral leg. In the time between having his cast removed and my evaluation, he had picked more dry skin and opened up additional wounds on his Achilles area and dorsal foot. The plantar first metatarsal head wound, however, is smaller and clean with just macerated callus around the perimeter and light slough on the surface. The lateral leg wound measured a little bit larger but is also fairly clean with eschar and minimal slough. 04/02/2022: The patient had vascular studies done last Friday and so his cast was not applied. He is here today to have that done. 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 04/05/2022: Both  wounds are smaller and  quite clean. There is just a little biofilm on the lateral leg wound. 10/20; the patient has a wound on the left lateral surgical incision at the level of his lateral knee this looks clean and improved. He is using silver alginate. He also has an area on his left medial foot for which she is using Hydrofera Blue under a total contact cast both wounds are measuring smaller 04/20/2022: The plantar foot wound has contracted considerably and is very close to closing. The lateral leg wound was measured a little larger, but there was a tiny open area that was included in the measurements that was not included last week. He has some eschar around the perimeter but otherwise the wound looks clean. 04/27/2022: The lateral leg wound looks better this week. He says that midweek, he felt it was very dry and began applying hydrogel to the site. I think this was beneficial. The foot wound is nearly closed underneath a thick layer of dry skin and callus. 05/04/2022: The foot wound is healed. He has developed a new small ulcer on his anterior tibial surface about midway up his leg. It has a little slough on the surface. The lateral leg wound still is fairly dry, but clean with just a little biofilm on the surface. 05/11/2022: The wound on his foot reopened on Wednesday. A large blister formed which then broke open revealing the fat layer underneath. The ulcer on his anterior tibial surface is a little bit larger this week. The lateral leg wound has much better moisture balance this week. Fortunately, prior to his foot wound reopening, he did get the cast made for his orthotic. 05/15/2022: Already, the left medial foot wound has improved. The tissue is less macerated and the surface is clean. The ulcer on his anterior tibial surface continues to enlarge. This seems likely secondary to accumulated moisture. The lateral leg wound continues to have an improved moisture balance with the use of  collagen. 05/25/2022: The medial foot wound continues to contract. It is now substantially smaller with just a little slough on the surface. The anterior tibial surface wound continues to enlarge further. Once again, this seems to be secondary to moisture. The lateral leg wound does not seem to be changing much in size, but the moisture balance is better. 06/01/2022: The anterior tibial wound is closed. The medial foot wound is down to just a very small, couple of millimeters, opening. The lateral leg wound has good moisture balance, but remains unchanged in size. 12/15; the patient's anterior tibial wound has reopened, however the area on his right first metatarsal head is closed. The major wound is actually on the superior part of his surgical wound in the left lateral thigh. Not a completely viable surface under illumination. This may at some point require a debridement I think he is currently using Prisma. As noted the left medial foot wound has closed 06/14/2022: The anterior tibial wound has closed. The lateral leg wound has a better surface but is basically unchanged in size. The left medial foot wound has reopened. It looks as though there was some callus accumulation and moisture got under the callus which caused the tissue to break down again. 06/21/2022: A new wound has opened up just distal to the previous anterior tibial wound. It is small but has hypertrophic granulation tissue present. The lateral leg wound is a little bit narrower and has a layer of slough on the surface. The left medial foot wound is down to just a pinhole.  His custom orthotics should be available next week. 06/28/2022: The wound on his first metatarsal head has healed. He has developed a new small wound on his medial lower leg, in an old scar site. The lateral leg BRENDEN, RUDMAN ([id number]) [id number] Page 8 of 21 wound continues to contract but continues to accumulate slough, as  well. 07/03/2022: Despite wearing his custom orthopedic shoes, he managed to reopen the wound on his first metatarsal head. He says he thinks his foot got wet and then some skin lifted up and he peeled this away. Both of the lower leg wounds are smaller and have some dry eschar on the surface. The lateral leg wound is quite a bit narrower today. Electronic Signature(s) Signed: 07/03/2022 8:08:45 AM By: Fredirick Maudlin MD FACS Entered By: Fredirick Maudlin on 07/03/2022 08:08:45 -------------------------------------------------------------------------------- Physical Exam Details Patient Name: Date of Service: [name]. 07/03/2022 7:45 A M Medical Record Number: [id number] Patient Account Number: [id number] Date of Birth/Sex: Treating RN: [birth year] (72 y.o. M) Primary Care Provider: [name] Other Clinician: Referring Provider: Treating Provider/Extender: [name] in Treatment: 42 Constitutional Slightly hypertensive. . . . no acute distress. Respiratory Normal work of breathing on room air. Notes 07/03/2022: Despite wearing his custom orthopedic shoes, he managed to reopen the wound on his first metatarsal head. He says he thinks his foot got wet and then some skin lifted up and he peeled this away. Both of the lower leg wounds are smaller and have some dry eschar on the surface. The lateral leg wound is quite a bit narrower today. Electronic Signature(s) Signed: 07/03/2022 8:10:32 AM By: Fredirick Maudlin MD FACS Entered By: Fredirick Maudlin on 07/03/2022 08:10:32 -------------------------------------------------------------------------------- Physician Orders Details Patient Name: Date of Service: [name]. 07/03/2022 7:45 A M Medical Record Number: [id number] Patient Account Number: [id number] Date of Birth/Sex: Treating RN: [birth year][name] (71 y.o. [name] Primary Care Provider: [name] Other Clinician: Referring Provider: Treating  Provider/Extender: [name] in Treatment: 88 Verbal / Phone Orders: No Diagnosis Coding ICD-10 Coding Code Description [id number] Non-pressure chronic ulcer of other part of left lower leg with other specified severity E11.621 Type 2 diabetes mellitus with foot ulcer 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 I89.0 Lymphedema, not elsewhere classified I87.322 Chronic venous hypertension (idiopathic) with inflammation of left lower extremity L97.528 Non-pressure chronic ulcer of other part of left foot with other specified severity Follow-up Appointments ppointment in 1 week. - Dr Celine Ahr - Room 1 Return A EDMUNDO, T[place] ([id number]) [id number] Page 9 of 21 Anesthetic Wound #22 Left,Proximal,Lateral Lower Leg (In clinic) Topical Lidocaine 4% applied to wound bed Bathing/ Shower/ Hygiene May shower with protection but do not get wound dressing(s) wet. Protect dressing(s) with water repellant cover (for example, large plastic bag) or a cast cover and may then take shower. Edema Control - Lymphedema / SCD / Other Avoid standing for long periods of time. Patient to wear own compression stockings every day. - on right leg; Moisturize legs daily. Compression stocking or Garment 20-30 mm/Hg pressure to: - left leg daily Off-Loading Total Contact Cast to Left Lower Extremity Other: - minimal weight bearing left foot Wound Treatment Wound #18R - Metatarsal head first Wound Laterality: Plantar, Left Cleanser: Soap and Water 1 x Per Week/30 Days Discharge Instructions: May shower and wash wound with dial antibacterial soap and water prior to dressing change. Cleanser: Wound Cleanser 1 x Per Week/30 Days Discharge Instructions:  Cleanse the wound with wound cleanser prior to applying a clean dressing using gauze sponges, not tissue or cotton balls. Prim Dressing: Sorbalgon AG Dressing 2x2 (in/in) 1 x  Per Week/30 Days ary Discharge Instructions: Apply to wound bed as instructed Secondary Dressing: Zetuvit Plus Silicone Border Dressing 4x4 (in/in) 1 x Per Week/30 Days Discharge Instructions: Apply silicone border over primary dressing as directed. Wound #22 - Lower Leg Wound Laterality: Left, Lateral, Proximal Cleanser: Soap and Water 3 x Per Week/30 Days Discharge Instructions: May shower and wash wound with dial antibacterial soap and water prior to dressing change. Cleanser: Wound Cleanser 3 x Per Week/30 Days Discharge Instructions: Cleanse the wound with wound cleanser prior to applying a clean dressing using gauze sponges, not tissue or cotton balls. Peri-Wound Care: Sween Lotion (Moisturizing lotion) 3 x Per Week/30 Days Discharge Instructions: Apply moisturizing lotion to the leg Topical: Skintegrity Hydrogel 4 (oz) 3 x Per Week/30 Days Discharge Instructions: Apply hydrogel as directed Prim Dressing: Endoform 2x2 in 3 x Per Week/30 Days ary Discharge Instructions: Moisten with Hydrogel or saline Secondary Dressing: ABD Pad, 8x10 (Generic) 3 x Per Week/30 Days Discharge Instructions: Apply over primary dressing as directed. Secured With: Elastic Bandage 4 inch (ACE bandage) 3 x Per Week/30 Days Discharge Instructions: Secure with ACE bandage as directed. Wound #30 - Lower Leg Wound Laterality: Left, Anterior Cleanser: Soap and Water 1 x Per Week/30 Days Discharge Instructions: May shower and wash wound with dial antibacterial soap and water prior to dressing change. Cleanser: Wound Cleanser 1 x Per Week/30 Days Discharge Instructions: Cleanse the wound with wound cleanser prior to applying a clean dressing using gauze sponges, not tissue or cotton balls. Prim Dressing: Sorbalgon AG Dressing 2x2 (in/in) 1 x Per Week/30 Days ary Discharge Instructions: Apply to wound bed as instructed Secondary Dressing: Zetuvit Plus Silicone Border Dressing 4x4 (in/in) 1 x Per Week/30  Days Discharge Instructions: Apply silicone border over primary dressing as directed. Wound #31 - Lower Leg Wound Laterality: Left, Medial Cleanser: Soap and Water 1 x Per Week/30 Days Discharge Instructions: May shower and wash wound with dial antibacterial soap and water prior to dressing change. Cleanser: Wound Cleanser 1 x Per Week/30 Days Discharge Instructions: Cleanse the wound with wound cleanser prior to applying a clean dressing using gauze sponges, not tissue or cotton balls. Prim Dressing: Sorbalgon AG Dressing 2x2 (in/in) ary 1 x Per Week/30 Days [name], [name] ([id number]) [id number] Page 10 of 21 Discharge Instructions: Apply to wound bed as instructed Secondary Dressing: Zetuvit Plus Silicone Border Dressing 4x4 (in/in) 1 x Per Week/30 Days Discharge Instructions: Apply silicone border over primary dressing as directed. Patient Medications llergies: No Known Drug Allergies A Notifications Medication Indication Start End 07/03/2022 lidocaine DOSE topical 4 % cream - cream topical Electronic Signature(s) Signed: 07/03/2022 11:56:27 AM By: Fredirick Maudlin MD FACS Entered By: Fredirick Maudlin on 07/03/2022 08:11:12 -------------------------------------------------------------------------------- Problem List Details Patient Name: Date of Service: [name]. 07/03/2022 7:45 A M Medical Record Number: [id number] Patient Account Number: [id number] Date of Birth/Sex: Treating RN: [birth year][name] (73 y.o. M) Primary Care Provider: [name] Other Clinician: Referring Provider: Treating Provider/Extender: [name] in Treatment: 82 Active Problems ICD-10 Encounter Code Description Active Date MDM Diagnosis L97.828 Non-pressure chronic ulcer of other part of left lower leg with other specified 04/12/2021 No Yes severity E11.621 Type 2 diabetes mellitus with foot ulcer 04/12/2021 No Yes E11.51 Type 2 diabetes mellitus  with diabetic peripheral angiopathy without gangrene  04/12/2021 No Yes I89.0 Lymphedema, not elsewhere classified 04/12/2021 No Yes I87.322 Chronic venous hypertension (idiopathic) with inflammation of left lower 04/12/2021 No Yes extremity L97.528 Non-pressure chronic ulcer of other part of left foot with other specified 04/12/2021 No Yes severity Inactive Problems ICD-10 Code Description Active Date Inactive Date L97.828 Non-pressure chronic ulcer of other part of left lower leg with other specified severity 06/08/2022 06/08/2022 [name] ([id number]) [phone].pdf Page 11 of 21 E11.42 Type 2 diabetes mellitus with diabetic polyneuropathy 04/12/2021 04/12/2021 L02.416 Cutaneous abscess of left lower limb 06/13/2021 06/13/2021 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 07/20/2021 07/20/2021 Resolved Problems Electronic Signature(s) Signed: 07/03/2022 8:06:48 AM By: Fredirick Maudlin MD FACS Entered By: Fredirick Maudlin on 07/03/2022 08:06:47 -------------------------------------------------------------------------------- Progress Note Details Patient Name: Date of Service: [name]. 07/03/2022 7:45 A M Medical Record Number: [id number] Patient Account Number: [id number] Date of Birth/Sex: Treating RN: [birth year], [name] (72 y.o. M) Primary Care Provider: [name] Other Clinician: Referring Provider: Treating Provider/Extender: [name] in Treatment: 37 Subjective Chief Complaint Information obtained from Patient Left leg and foot ulcers 04/12/2021; patient is here for wounds on his left lower leg and left plantar foot over the first metatarsal head History of Present Illness (HPI) 10/11/17; Mr. Angell is a 72 year old man who tells me that in 2015 he slipped down the latter traumatizing his left leg. He developed a wound in the same spot the area that we are currently looking at. He [name] this  closed over for the most part although he always felt it was somewhat unstable. In 2016 he hit the same area with the door of his car had this reopened. He tells me that this is never really closed although sometimes an inflow it remains open on a constant basis. He has not been using any specific dressing to this except for topical antibiotics the nature of which were not really sure. His primary doctor did send him to see Dr. [name] of interventional cardiology. He underwent an angiogram on 08/06/17 and he underwent a PTA and directional atherectomy of the lesser distal SFA and popliteal arteries which resulted in brisk improvement in blood flow. It was noted that he had 2 vessel runoff through the anterior tibial and peroneal. He is also been to see vascular and interventional radiologist. He was not felt to have any significant superficial venous insufficiency. Presumably is not a candidate for any ablation. It was suggested he come here for wound care. The patient is a type II diabetic on insulin. He also has a history of venous insufficiency. ABIs on the left were noncompressible in our clinic 10/21/17; patient we admitted to the clinic last week. 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 We put Iodosorb on him after an aggressive debridement and 3 layer compression. He complained of pain in his ankle and itching with is skin in fact he scratched the area on the medial calf superiorly at the rim of our wraps and he has 2 small open areas in that location today which are new. I changed his primary dressing today to silver collagen. As noted he is already had revascularization and does not have any significant superficial venous insufficiency that would be amenable to ablation 10/28/17; patient admitted to the clinic 2 weeks ago. He has a smaller Wound. Scratch injury from last week revealed. There is large wound over the tibial  area. This  is smaller. Granulation looks healthy. No need for debridement. 11/04/17; the wound on the left lateral calf looks better. Improved dimensions. Surface of this looks better. We've been maintaining him and Kerlix Coban wraps. He finds this much more comfortable. Silver collagen dressing 11/11/17; left lateral Wound continues to look healthy be making progress. Using a #5 curet I removed removed nonviable skin from the surface of the wound and then necrotic debris from the wound surface. Surface of the wound continues to look healthy. ooHe also has an open area on the left great toenail bed. We've been using topical antibiotics. 11/19/17; left anterior lateral wound continues to look healthy but it's not closed. ooHe also had a small wound above this on the left leg ooInitially traumatic wounds in the setting of significant chronic venous insufficiency and stasis dermatitis 11/25/17; left anterior wounds superiorly is closed still a small wound inferiorly. 12/02/17; left anterior tibial area. Arrives today with adherent callus. Post debridement clearly not completely closed. Hydrofera Blue under 3 layer compression. 12/09/17; left anterior tibia. Circumferential eschar however the wound bed looks stable to improved. We've been using Hydrofera Blue under 3 layer compression 12/17/17; left anterior tibia. Apparently this was felt to be closed however when the wrap was taken off there is a skin tear to reopen wounds in the same area we've been using Hydrofera Blue under 3 layer compression 12/23/17 left anterior tibia. Not close to close this week apparently the [facility] was stuck to this again. Still circumferential eschar requiring debridement. I put a contact layer on this this time under the Hydrofera Blue 12/31/17; left anterior tibia. Wound is better slight amount of hyper-granulation. Using Hydrofera Blue over Adaptic. 01/07/18; left anterior tibia. The wound had some surface eschar  however after this was removed he has no open wound.he was already revascularized by Dr. [name] ([id number]) [id number] Page 12 of 69 [place] when he came to our clinic with atherectomy of the left SFA and popliteal artery. He was also sent to interventional radiology for venous reflux studies. He was not felt to have significant reflux but certainly has chronic venous changes of his skin with hemosiderin deposition around this area. He will definitely need to lubricate his skin and wear compression stocking and I've talked to him about this. READMISSION 05/26/2018 This is a now 71 year old man we cared for with traumatic wounds on his left anterior lower extremity. He had been previously revascularized during that admission by Dr. [name]. Apparently in follow-up Dr. [name] noted that he had deterioration in his arterial status. He underwent a stent placement in the distal left SFA on 04/22/2018. 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 He went back to the angiography suite on 04/30/2018 he underwent PTA and balloon angioplasty of the occluded left mid anterior tibial artery, thrombotic occlusion went from 100 to 0% which reconstitutes the posterior tibial artery. He had thrombectomy and aspiration of the peroneal artery. The stent placed in the distal SFA left SFA was still occluded. 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 He has been recuperating at [place] home in [facility] after the most recent hospitalization. He is going home tomorrow. He tells me that 2 weeks ago he traumatized the tip of  his left fifth toe. He came in urgently for our review of this. This was a history of before I noted  that Dr. [name] had already noted dry gangrenous changes of the left fifth toe 06/09/2018; 2-week follow-up. I did contact Dr. [name] after his last appointment and he apparently saw 1 of Dr. Irven Shelling colleagues the next day. He does not follow-up with Dr. [name] himself until Thursday of this week. 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 Nevertheless there is no evidence of infection no drainage and no pain. He had a new area that this week when we were signing him in today on the left anterior mid tibia area, this is in close proximity to the previous wound we have dealt with in this clinic. 06/23/2018; 2-week follow-up. I did not receive a recent note from Dr. [name] to review today. Our office is trying to obtain this. He is apparently not planning to do further vascular interventions and wondered about compression to try and help with the patient's chronic venous insufficiency. However we are also concerned about the arterial flow. ooHe arrives in clinic today with a new area on the left third toe. The areas on the calf/anterior tibia are close to closing. The left fifth toe is still mummified using Betadine. -In reviewing things with the patient he has what sounds like claudication with mild to moderate amount of activity. 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 I prescribed Levaquin over the phone while we attempted to arrange a plan of care. However the patient called yesterday to report he had low-grade fever and he came in today acutely. There is been a marked deterioration in the left third toe with spreading cellulitis up into the dorsal left foot. He was referred to the emergency room. Readmission: 06/29/2020 patient presents today for reevaluation here in our clinic he was previously treated by Dr. Dellia Nims at the latter part of 2019 in 2 the beginning of 2020. Subsequently we have not seen him since that time in the interim he did have  evaluation with vein and vascular specialist specifically Dr. Anice Paganini who did perform quite extensive work for a left femoral to anterior tibial artery bypass. With that being said in the interim the patient has developed significant lymphedema and has wounds that he tells me have really never healed in regard to the incision site on the left leg. He also has multiple wounds on the feet for various reasons some of which is that he tends to pick at his feet. Fortunately there is no signs of active infection systemically at this time he does have some wounds that are little bit deeper but most are fairly superficial he seems to have good blood flow and overall everything appears to be healthy I see no bone exposed and no obvious signs of osteomyelitis. I do not know that he necessarily needs a x-ray at this point although that something we could consider depending on how things progress. The patient does have a history of lymphedema, diabetes, this is type II, chronic kidney disease stage III, hypertension, and history of peripheral vascular disease. 07/05/2020; patient admitted last week. Is a patient I remember from 2019 he had a spreading infection involving the left foot and we sent him to the hospital. 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 He subsequently had a left femoral to anterior tibial bypass by  Dr.Cain vein and vascular. He also has severe lymphedema with chronic skin changes related to that on the left leg. The most problematic area that was new today was on the left medial great toe. This was apparently a small area last week there was purulent drainage which our intake nurse cultured. Also areas on the left medial foot and heel left lateral foot. He has 2 areas on the left medial calf left lateral calf in the setting of the severe lymphedema. 07/13/2020 on evaluation today patient appears to be doing better in my opinion compared to his last  visit. The good news is there is no signs of active infection systemically and locally I do not see any signs of infection either. He did have an x-ray which was negative that is great news he had a culture which showed MRSA but at the same time he is been on the doxycycline which has helped. I do think we may want to extend this for 7 additional days 1/25; patient admitted to the clinic a few weeks ago. 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 We have been putting him under compression his edema control is a lot better but he is severe verricused skin on the left leg. He is really done quite well he still has an open area on the left medial calf and the left medial first metatarsal head. We have been using silver collagen on the leg silver alginate on the foot 07/27/2020 upon evaluation today patient appears to be doing decently well in regard to his wounds. He still has a lot of dry skin on the left leg. Some of this is starting to peel back and I think he may be able to have them out by removing some that today. Fortunately there is no signs of active infection at this time on the left leg although on the right leg he does appear to have swelling and erythema as well as some mild warmth to touch. This does have been concerned about the possibility of cellulitis although within the differential diagnosis I do think that potentially a DVT has to be at least considered. We need to rule that out before proceeding would just call in the cellulitis. Especially since he is having pain in the posterior aspect of his calf muscle. 2/8; the patient had seen sparingly. He has severe skin changes of chronic lymphedema in the left leg thickened hyperkeratotic verrucous skin. He has an open wound on the medial part of the left first met head left mid tibia. He also has a rim of nonepithelialized skin in the anterior mid tibia. He brought in the AmLactin lotion that was  been prescribed although I am not sure under compression and its utility. There concern about cellulitis on the right lower leg the last time he was here. He was put on on antibiotics. His DVT rule out was negative. The right leg looks fine he is using his stocking on this area 08/10/2020 upon evaluation today patient appears to be doing well with regard to his leg currently. He has been tolerating the dressing changes without complication. Fortunately there is no signs of active infection which is great news. Overall very pleased with where things stand. 2/22; the patient still has an area on the medial part of the left first met his head. This looks better than when I last saw this earlier this month he has a rim of epithelialization but still some surface debris.  Mostly everything on the left leg is healed. There is still a vulnerable in the left mid tibia area. 08/30/2020 upon evaluation today patient appears to be doing much better in regard to his wounds on his foot. 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 Nonetheless I think we will address that today I Minna send in a prescription for him in that regard. Overall though there does not appear to be any signs of significant worsening. 09/07/2020 on evaluation today patient's wounds over his left foot appear to be doing excellent. I do not see any signs of infection there is some callus buildup this can require debridement for certain but overall I feel like he is managing quite nicely. He still using the AmLactin cream which has been beneficial for him as well. 3/22; left foot wound is closed. There is no open area here. He is using ammonium lactate lotion to the lower extremities to help exfoliate dry cracked skin. He has compression stockings from elastic therapy in [place]. The wound on the medial part of his  left first met head is healed today. READMISSION 04/12/2021 Mr. Cotta is a patient we know fairly well he had a prolonged stay in clinic in 2019 with wounds on his left lateral and left anterior lower extremity in the setting of chronic venous insufficiency. More recently he was here earlier this year with predominantly an area on his left foot first metatarsal head plantar and he says the plantar foot broke down on its not long after we discharged him but he did not come back here. The last few months areas of broken down on his left RIVAN, SIORDIA ([id number]) [id number] Page 13 of 21 anterior and again the left lateral lower extremity. The leg itself is very swollen chronically enlarged a lot of hyperkeratotic dry Berry Q skin in the left lower leg. His edema extends well into the thigh. He was seen by Dr. Donzetta Matters. He had ABIs on 03/02/2021 showing an ABI on the right of 1 with a TBI of 0.72 his ABI in the left at 1.09 TBI of 0.99. Monophasic and biphasic waveforms on the right. On the left monophasic waveforms were noted he went on to have an angiogram on 03/27/2021 this showed the aortic aortic and iliac segments were free of flow-limiting stenosis the left common femoral vein to evaluate the left femoral to anterior tibial artery bypass was unobstructed the bypass was patent without any areas of stenosis. We discharged the patient in bilateral juxta lite stockings but very clearly that was not sufficient to control the swelling and maintain skin integrity. He is clearly going to need compression pumps. 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 This is fortunate because he is on his feet for long periods of time. 10/27; patient comes in with our intake nurse reporting copious amount of green drainage from the left anterior mid tibia the left dorsal foot and to a lesser extent the left medial mid tibia. We left the  compression wrap on all week for the amount of edema in his left leg is quite a bit better. We use silver alginate as the primary dressing 11/3; edema control is good. Left anterior lower leg left medial lower leg and the plantar first metatarsal head. The left anterior  lower leg required debridement. Deep tissue culture I did of this wound showed MRSA I put him on 10 days of doxycycline which she will start today. We have him in compression wraps. He has a security card and [employer] however he is retiring on November 15. We will need to then get him into a better offloading boot for the left foot perhaps a total contact cast 11/10; edema control is quite good. Left anterior and left medial lower leg wounds in the setting of chronic venous insufficiency and lymphedema. He also has a substantial area over the left plantar first metatarsal head. 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 He has significant hypergranulation on the left plantar foot wound. The patient is a diabetic but he does not have significant PAD 11/17; edema control is quite good. Left anterior and left medial lower leg wounds look better. The really concerning area remains the area on the left plantar first metatarsal head. He has a rim of epithelialization. 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 Starting today with a forefoot off loader but . possibly a total contact cast. 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 Arrives with a very significant odor from I think all of the wounds on the left leg and the left foot. Because of this I did not put a total contact cast on him today but will could still consider this. His  wife was having cataract surgery which is the reason he missed the appointment 12/6. I saw this man 5 days ago with a swelling below the popliteal fossa. I thought he actually might have a Baker's cyst however the DVT rule out study that we could arrange right away was negative the technician told me this was not a ruptured Baker's cyst. We attempted to get this aspirated by under ultrasound guidance in interventional radiology however all they did was an ultrasound however it shows an extensive fluid collection 62 x 8 x 9.4 in the left thigh and left calf. The patient [name] he thinks this started 8 days ago or so but he really is not complaining of any pain, fever or systemic symptoms. He has not ha 12/20; after some difficulty I managed to get the patient into see Dr. Donzetta Matters. Eventually he was taken into the hospital and had a drain put in the fluid collection below his left knee posteriorly extending into the posterior thigh. He still has the drain in place. Culture of this showed moderate staff aureus few Morganella and few Klebsiella he is now on doxycycline and ciprofloxacin as suggested by infectious disease he is on this for a month. The drain will remain in place until it stops draining 12/29; he comes in today with the 1 wound on his left leg and the area on the left plantar first met head significantly smaller. Both look healthy. He still has the drain in the left leg. He says he has to change this daily. Follows up with Dr. Donzetta Matters on January 11. 06/29/2021; the wounds that I am following on the left leg and left first met head continued to be quite healthy. However the area where his  inferior drain is in place had copious amounts of drainage which was green in color. The wound here is larger. Follows up with Dr. Gwenlyn Saran of vein and vascular his surgeon next week as well as infectious disease. He remains on ciprofloxacin and doxycycline. He is not complaining of excessive pain in either one of the  drain areas 1/12; the patient saw vascular surgery and infectious disease. Vascular surgery has left the drain in place as there was still some notable drainage still see him back in 2 weeks. Dr. Velna Ochs stop the doxycycline and ciprofloxacin and I do not believe he follows up with them at this point. Culture I did last week showed both doxycycline resistant MRSA and Pseudomonas not sensitive to ciprofloxacin although only in rare titers 1/19; the patient's wound on the left anterior lower leg is just about healed. We have continued healing of the area that was medially on the left leg. Left first plantar metatarsal head continues to get smaller. The major problem here is his 2 drain sites 1 on the left upper calf and lateral thigh. There is purulent drainage still from the left lateral thigh. I gave him antibiotics last week but we still have recultured. He has the drain in the area I think this is eventually going to have to come out. I suspect there will be a connecting wound to heal here perhaps with improved VAc 1/26; the patient had his drain removed by vein and vascular on 1/25/. This was a large pocket of fluid in his left thigh that seem to tunnel into his left upper calf. 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 His mention his Penrose drain was removed today. He now has a tunneling wound on his left calf and left thigh. Both of these probe widely towards each other although I cannot really prove that they connect. Both wounds on his lower leg anteriorly are closed and his area over the first metatarsal head on his right foot continues to improve. We are using Hydrofera Blue here. He also saw infectious disease culture of the abscess they noted was polymicrobial with MRSA, Morganella and Klebsiella he was treated with doxycycline and ciprofloxacin for 4 weeks ending on 07/03/2021. They did not recommend any further antibiotics. Notable that while he still had the Penrose  drain in place last week he had purulent drainage coming out of the inferior IandD site this grew [facility], MRSA and Pseudomonas but there does not appear to be any active infection in this area today with the drain out and he is not systemically unwell 2/2; with regards to the drain sites the superior one on the thigh actually is closed down the one on the upper left lateral calf measures about 8 and half centimeters which is an improvement seems to be less prominent although still with a lot of drainage. The only remaining wound is over the first metatarsal head on the left foot and this looks to be continuing to improve with Hydrofera Blue. 2/9; the area on his plantar left foot continues to contract. Callus around the wound edge. The drain sites specifically have not come down in depth. We put the wound VAC on Monday he changed the canister late last night our intake nurse reported a pocket of fluid perhaps caused by our compression wraps 2/16; continued improvement in left foot plantar wound. drainage site in the calf is not improved in terms of depth (wound vac) 2/23; continued improvement in the left foot  wound over the first metatarsal head. With regards to the drain sites the area on his thigh laterally is healed however the open area on his calf is small in terms of circumference by still probes in by about 15 cm. Within using the wound VAC. Hydrofera Blue on his foot 08/24/2021: The left first metatarsal head wound continues to improve. The wound bed is healthy with just some surrounding callus. Unfortunately the open drain site on his calf remains open and tunnels at least 15 cm (the extent of a Q-tip). This is despite several weeks of wound VAC treatment. Based on reading back through the notes, there has been really no significant change in the depth of the wound, although the orifice is smaller and the more cranial wound on his thigh has closed. I suspect the tunnel tracks nearly  all the way to this location. 08/31/2021: Continued improvement in the left first metatarsal head wound. There has been absolutely no improvement to the long tunnel from his open drain site [name], [name] ([id number]) [id number] Page 14 of 21 on his calf. We have tried to get him into see vascular surgery sooner to consider the possibility of simply filleting the tract open and allowing it to heal from the bottom up, likely with a wound VAC. They have not yet scheduled a sooner appointment than his current mid April 09/14/2021: He was seen by vascular surgery and they took him to the operating room last week. They opened a portion of the tunnel, but did not extend the entire length of the known open subcutaneous tract. I read Dr. Claretha Cooper operative note and it is not clear from that documentation why only a portion of the tract was opened. The heaped up granulation tissue was curetted and removed from at least some portion of the tract. They did place a wound VAC and applied an Unna boot to the leg. The ulcer on his left first metatarsal head is smaller today. The bed looks good and there is just a small amount of surrounding callus. 09/21/2021: The ulcer on his left first metatarsal head looks to be stalled. There is some callus surrounding the wound but the wound bed itself does not appear particularly dynamic. The tunnel tract on his lateral left leg seems to be roughly the same length or perhaps slightly smaller but the wound bed appears healthy with good granulation tissue. He opened up a new wound on his medial thigh and the site of a prior surgical incision. He says that he did this unconsciously in his sleep by scratching. 09/28/2021: Unfortunately, the ulcer on his left first metatarsal head has extended underneath the callus toward the dorsum of his foot. The medial thigh wounds are roughly the same. The tunnel on his lateral left leg continues to be problematic; it is  longer than we are able to actually probe with a Q-tip. I am still not certain as to why Dr. Donzetta Matters did not open this up entirely when he took the patient to the operating room. We will likely be back in the same situation with just a small superficial opening in a long unhealed tract, as the open portion is granulating in nicely. 10/02/2021: The patient was initially scheduled for a nurse visit, but we are also applying a total contact cast today. The plantar foot wound looks clean without significant accumulated callus. We have been applying Prisma silver collagen to the site. 10/05/2021: The patient is here for his first total contact cast change. We have  tried using gauze packing strips in the tunnel on his lateral leg wound, but this does not seem to be working any better than the white VAC foam. The foot ulcer looks about the same with minimal periwound callus. Medial thigh wound is clean with just some overlying eschar. 10/12/2021: The plantar foot wound is stable without any significant accumulation of periwound callus. The surface is viable with good granulation tissue. The medial thigh wounds are much smaller and are epithelializing. On the other hand, he had purulent drainage coming from the tunnel on his lateral leg. He does go back to see Dr. Donzetta Matters next week and is planning to ask him why the wound tunnel was not completely opened at the time of his most recent operation. 10/19/2021: The plantar foot wound is markedly improved and has epithelial tissue coming through the surface. The medial thigh wounds are nearly closed with just a tiny open area. He did see Dr. Donzetta Matters earlier this week and apparently they did discuss the possibility of opening the sinus tract further and enabling a wound VAC application. Apparently there are some limits as to what Dr. 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 I think if we could get the tract open to the level of the  popliteal fossa, this would greatly aid in her ability to get this chart closed. That being said, however, today when I probed the tract with a Q-tip, I was not able to insert the entirety of the Q-tip as I have on previous occasions. The tunnel is shorter by about 4 cm. The surface is clean with good granulation tissue and no further episodes of purulent drainage. 10/30/2021: Last week, the patient underwent surgery and had the long tract in his leg opened. There was a rind that was debrided, according to the operative report. His medial thigh ulcers are closed. The plantar foot wound is clean with a good surface and some built up surrounding callus. 11/06/2021: The overall dimensions of the large wound on his lateral leg remain about the same, but there is good granulation tissue present and the tunneling is a little bit shorter. He has a new wound on his anterior tibial surface, in the same location where he had a similar lesion in the past. The plantar foot wound is clean with some buildup surrounding callus. Just toward the medial aspect of his foot, however, there is an area of darkening that once debrided, revealed another opening in the skin surface. 11/13/2021: The anterior tibial surface wound is closed. The plantar foot wound has some surrounding callus buildup. The area of darkening that I debrided last week and revealed an opening in the skin surface has closed again. The tunnel in the large wound on his lateral leg has come in by about 3 cm. There is healthy granulation tissue on the entire wound surface. 11/23/2021: The patient was out of town last week and did wet-to-dry dressings on his large wound. 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 Unfortunately, he picked open the wound on his left medial thigh. He says that it was itching and he just could not stop scratching it until it was open again. The wound on his plantar  foot is smaller and has not accumulated a tremendous amount of callus. The lateral leg wound is shallower and the tunnel has also decreased in depth. There is just a little bit of slough accumulation on the  surface. 11/30/2021: Another portion of his left medial thigh has been opened up. All of these wounds are fairly superficial with just a little bit of slough and eschar accumulation. The wound on his plantar foot is almost closed with just a bit of eschar and periwound callus accumulation. The lateral leg wound is nearly flush with the surrounding skin and the tunnel is markedly shallower. 12/07/2021: There is just 1 open area on his left medial thigh. It is clean with just a little bit of perimeter eschar. The wound on his plantar foot continues to contract and just has some eschar and periwound callus accumulation. The lateral leg wound is closing at the more distal aspect and the tunnel is smaller. The surface is nearly flush with the surrounding skin and it has a good bed of granulation tissue. 12/14/2021: The thigh and foot wounds are closed. The lateral leg wound has closed over approximately half of its length. The tunnel continues to contract and the surface is now flush with the surrounding skin. The wound bed has robust granulation tissue. 12/22/2021: The thigh and foot wounds have reopened. The foot wound has a lot of callus accumulation around and over it. The thigh wound is tiny with just a little bit of slough in the wound bed. The lateral leg wound continues to contract. His vascular surgeon took the wound VAC off earlier in the week and the patient has been doing wet-to-dry dressings. There is a little slough accumulation on the surface. The tunnel is about 3 cm in depth at this point. 12/28/2021: The thigh wound is closed again. The foot wound has some callus that subsequently has peeled back exposing just a small slit of a wound. The lateral leg wound Is down to about half the size that  it originally was and the tunnel is down to about half a centimeter in depth. 01/04/2022: The thigh wound remains closed. The foot wound has heavy callus overlying the wound site. Once this was debrided, the wound was found to be closed. The lateral leg wound is smaller again this week and very superficial. No tunnel could be identified. 01/12/2022: The thigh and foot wounds both remain closed. The lateral leg wound is now nearly flush with the skin surface. There is good granulation tissue present with a light layer of slough. 01/19/2022: Due to the way his wrap was placed, the patient did not change the dressing on his thigh at all and so the foam was saturated and his skin is macerated. There is a light layer of slough on the wound surface. The underlying granulation tissue is robust and healthy-appearing. He has heavy callus buildup at the site of his first metatarsal head wound which is still healed. 02/01/2022: He has been in silver alginate. When he removed the dressing from his thigh wound, however, some leg, superficially reopening a portion of the wound that had healed. In addition, underneath the callus at his left first metatarsal head, there appears to be a blister and the wound appears to be open again. 02/08/2022: The lateral leg wound has contracted substantially. There is eschar and a light layer of slough present. He says that it is starting to pull and is uncomfortable. On inspection, there is some puckering of the scar and the eschar is quite dry; this may account for his symptoms. On his first metatarsal head, the wound is much smaller with just some eschar on the surface. The callus has not reaccumulated. He reports that he had a blister  come up on his medial thigh wound at the distal aspect. It popped and there is now an opening in his skin again. Looking back through his [place] of wound photos, there is what looks like a permanent suture just deep to this location and it may be  trying to erode through. We have been using silver alginate on his wounds. 02/15/2022: The lateral leg wound is about half the size it was last week. It is clean with just a little perimeter eschar and light slough. The wound on his first metatarsal head is about the same with heavy callus overlying it. The medial thigh wound is closed again. He does have some skin changes on the top of his JAYKE, CAUL ([id number]) [id number] Page 15 of 21 foot that looks potentially yeast related. 02/22/2022: The skin on the top of his foot improved with the use of a topical antifungal. The lateral leg wound continues to contract and is again smaller this week. There is a little bit of slough and eschar on the surface. The first metatarsal head wound is a little bit smaller but has reaccumulated a thick callus over the top. He decided to try to trim his toenail and ultimately took the entire nail off of his left great toe. 03/02/2022: His lateral leg wound continues to improve, as does the wound on his left great toe. Unfortunately, it appears that somehow his foot got wet and moisture seeped in through the opening causing his skin to lift. There is a large wound now overlying his first metatarsal on both the plantar, medial, and dorsal portion of his foot. There is necrotic tissue and slough present underneath the shaggy macerated skin. 03/08/2022: The lateral leg wound is smaller again today. There is just a light layer of slough and eschar on the surface. The great toe wound is smaller again today. The first metatarsal wound is a little bit smaller today and does not look nearly as necrotic and macerated. There is still slough and nonviable tissue present. 03/15/2022: The lateral leg wound is narrower and just has a little bit of light slough buildup. The first metatarsal wound still has a fair amount of moisture affecting the periwound skin. The great toe wound is healed. 03/22/2022: The  lateral leg wound is now isolated to just at the level of his knee. There is some eschar and slough accumulation. The first metatarsal head wound has epithelialized tremendously and is about half the size that it was last week. He still has some maceration on the top of his foot and a fungal odor is present. 03/29/2022: T oday the patient's foot was macerated, suggesting that the cast got wet. The patient has also been picking at his dry skin and has enlarged the wound on his left lateral leg. In the time between having his cast removed and my evaluation, he had picked more dry skin and opened up additional wounds on his Achilles area and dorsal foot. The plantar first metatarsal head wound, however, is smaller and clean with just macerated callus around the perimeter and light slough on the surface. The lateral leg wound measured a little bit larger but is also fairly clean with eschar and minimal slough. 04/02/2022: The patient had vascular studies done last Friday and so his cast was not applied. He is here today to have that done. 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 04/05/2022: Both wounds are smaller and quite clean. There is just a little biofilm on the lateral  leg wound. 10/20; the patient has a wound on the left lateral surgical incision at the level of his lateral knee this looks clean and improved. He is using silver alginate. He also has an area on his left medial foot for which she is using Hydrofera Blue under a total contact cast both wounds are measuring smaller 04/20/2022: The plantar foot wound has contracted considerably and is very close to closing. The lateral leg wound was measured a little larger, but there was a tiny open area that was included in the measurements that was not included last week. He has some eschar around the perimeter but otherwise the wound looks clean. 04/27/2022: The lateral leg wound looks better this week. He says that midweek, he felt it  was very dry and began applying hydrogel to the site. I think this was beneficial. The foot wound is nearly closed underneath a thick layer of dry skin and callus. 05/04/2022: The foot wound is healed. He has developed a new small ulcer on his anterior tibial surface about midway up his leg. It has a little slough on the surface. The lateral leg wound still is fairly dry, but clean with just a little biofilm on the surface. 05/11/2022: The wound on his foot reopened on Wednesday. A large blister formed which then broke open revealing the fat layer underneath. The ulcer on his anterior tibial surface is a little bit larger this week. The lateral leg wound has much better moisture balance this week. Fortunately, prior to his foot wound reopening, he did get the cast made for his orthotic. 05/15/2022: Already, the left medial foot wound has improved. The tissue is less macerated and the surface is clean. The ulcer on his anterior tibial surface continues to enlarge. This seems likely secondary to accumulated moisture. The lateral leg wound continues to have an improved moisture balance with the use of collagen. 05/25/2022: The medial foot wound continues to contract. It is now substantially smaller with just a little slough on the surface. The anterior tibial surface wound continues to enlarge further. Once again, this seems to be secondary to moisture. The lateral leg wound does not seem to be changing much in size, but the moisture balance is better. 06/01/2022: The anterior tibial wound is closed. The medial foot wound is down to just a very small, couple of millimeters, opening. The lateral leg wound has good moisture balance, but remains unchanged in size. 12/15; the patient's anterior tibial wound has reopened, however the area on his right first metatarsal head is closed. The major wound is actually on the superior part of his surgical wound in the left lateral thigh. Not a completely viable  surface under illumination. This may at some point require a debridement I think he is currently using Prisma. As noted the left medial foot wound has closed 06/14/2022: The anterior tibial wound has closed. The lateral leg wound has a better surface but is basically unchanged in size. The left medial foot wound has reopened. It looks as though there was some callus accumulation and moisture got under the callus which caused the tissue to break down again. 06/21/2022: A new wound has opened up just distal to the previous anterior tibial wound. It is small but has hypertrophic granulation tissue present. The lateral leg wound is a little bit narrower and has a layer of slough on the surface. The left medial foot wound is down to just a pinhole. His custom orthotics should be available next week. 06/28/2022: The  wound on his first metatarsal head has healed. He has developed a new small wound on his medial lower leg, in an old scar site. The lateral leg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

## 2022-07-05 ENCOUNTER — Encounter (HOSPITAL_BASED_OUTPATIENT_CLINIC_OR_DEPARTMENT_OTHER): Payer: Medicare Other | Admitting: General Surgery

## 2022-07-12 ENCOUNTER — Encounter (HOSPITAL_BASED_OUTPATIENT_CLINIC_OR_DEPARTMENT_OTHER): Payer: Medicare Other | Admitting: General Surgery

## 2022-07-12 DIAGNOSIS — E11621 Type 2 diabetes mellitus with foot ulcer: Secondary | ICD-10-CM | POA: Diagnosis not present

## 2022-07-12 NOTE — Progress Notes (Signed)
[name], [name] ([id number]) [id number] Page 1 of 9 Visit Report for 07/12/2022 Arrival Information Details Patient Name: Date of Service: [name], [name] 07/12/2022 12:30 PM Medical Record Number: [id number] Patient Account Number: [id number] Date of Birth/Sex: Treating RN: [birth year] (72 y.o. [name] Primary Care Deborahann Poteat: [name] Other Clinician: Referring Julus Kelley: Treating Anab Vivar/Extender: [name] in Treatment: 105 Visit Information History Since Last Visit Added or deleted any medications: No Patient Arrived: Ambulatory Any new allergies or adverse reactions: No Arrival Time: 12:39 Had a fall or experienced change in No Accompanied By: [id number] activities of daily living that may affect Transfer Assistance: None risk of falls: Patient Requires Transmission-Based Precautions: No Signs or symptoms of abuse/neglect since last visito No Patient Has Alerts: Yes Hospitalized since last visit: No Implantable device outside of the clinic excluding No cellular tissue based products placed in the center since last visit: Has Dressing in Place as Prescribed: Yes Pain Present Now: No Electronic Signature(s) Signed: 07/12/2022 4:47:13 PM By: Blanche East RN Entered By: Blanche East on 07/12/2022 12:41:29 -------------------------------------------------------------------------------- Lower Extremity Assessment Details Patient Name: Date of Service: [name], [name] 07/12/2022 12:30 PM Medical Record Number: [id number] Patient Account Number: [id number] Date of Birth/Sex: Treating RN: 1950-11-07 (72 y.o. [name] Primary Care Chenita Ruda: [name] Other Clinician: Referring Zhamir Pirro: Treating Janise Gora/Extender: [name] in Treatment: 65 Edema Assessment Assessed: [Left: No] [Right: No] Edema: [Left: Ye] [Right: s] Calf Left: Right: Point of Measurement: 41 cm From Medial Instep  48 cm Ankle Left: Right: Point of Measurement: 10 cm From Medial Instep 29 cm Vascular Assessment Pulses: Dorsalis Pedis Palpable: [Left:Yes] [Right:[id number].pdf Page 2 of 9] Electronic Signature(s) Signed: 07/12/2022 4:47:13 PM By: Blanche East RN Entered By: Blanche East on 07/12/2022 12:53:04 -------------------------------------------------------------------------------- Multi Wound Chart Details Patient Name: Date of Service: [name]. 07/12/2022 12:30 PM Medical Record Number: [id number] Patient Account Number: [id number] Date of Birth/Sex: Treating RN: 1951/06/23 (72 y.o. 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 Primary Etiology: Extremity Glaucoma, Sleep Apnea, Glaucoma, Sleep Apnea, Glaucoma, Sleep Apnea, Comorbid History: Hypertension, Peripheral Arterial Hypertension, Peripheral Arterial Hypertension, Peripheral Arterial Disease, Peripheral Venous Disease, Disease, Peripheral Venous Disease, Disease, Peripheral Venous Disease, Type II Diabetes, Gout, Osteoarthritis, Type II Diabetes, Gout, Osteoarthritis, Type II Diabetes, Gout, Osteoarthritis, Neuropathy Neuropathy Neuropathy 08/23/2020 06/03/2021 06/21/2022 Date Acquired: 65 108 3 Weeks of Treatment: Open Open Open Wound Status: Yes No No Wound Recurrence: No Yes No Clustered Wound: N/A 3 N/A Clustered Quantity: 1.8x2x0.1  5.2x1.8x0.1 0.5x0.3x0.1 Measurements L x W x D (cm) 2.827 7.351 0.118 A (cm) : rea 0.283 0.735 0.012 Volume (cm) : 80.00% -345.80% 70.00% % Reduction in A rea: 90.00% 44.30% 69.20% % Reduction in Volume: Grade 2 Full Thickness With Exposed Support Full Thickness Without Exposed Classification: Structures Support Structures Medium Medium Medium Exudate A mount: Serosanguineous Serosanguineous Serous Exudate Type: red, brown red, brown amber Exudate Color: Flat and Intact Fibrotic scar, thickened scar Distinct, outline attached Wound Margin: Small (1-33%) Large (67-100%) Large (67-100%) Granulation A mount: Red Red, Pink Red Granulation Quality: Large (67-100%)  Small (1-33%) Small (1-33%) Necrotic A mount: Adherent [place] Necrotic Tissue: Fat Layer (Subcutaneous Tissue): Yes Fat Layer (Subcutaneous Tissue): Yes Fat Layer (Subcutaneous Tissue): Yes Exposed Structures: Fascia: No Fascia: No Fascia: No Tendon: No Tendon: No Tendon: No Muscle: No Muscle: No Muscle: No Joint: No Joint: No Joint: No Bone: No Bone: No Bone: No Small (1-33%) Medium (34-66%) Large (67-100%) Epithelialization: N/A Debridement - Selective/Open Wound N/A Debridement: Pre-procedure Verification/Time Out N/A 13:13 N/A Taken: [name] ([id number]) [id number] Page 3 of 9 N/A Lidocaine 5% topical ointment N/A Pain Control: N/A Slough N/A Tissue Debrided: N/A Non-Viable Tissue N/A Level: N/A 9.36 N/A Debridement A (sq cm): rea N/A Curette N/A Instrument: N/A Minimum N/A Bleeding: N/A Pressure N/A Hemostasis A chieved: N/A Procedure was tolerated well N/A Debridement Treatment Response: N/A 5.2x1.8x0.1 N/A Post Debridement Measurements L x W x D (cm) N/A 0.735 N/A Post Debridement Volume: (cm) Callus: Yes Scarring: Yes No Abnormalities Noted Periwound Skin Texture: Dry/Scaly: Yes Dry/Scaly:  Yes Maceration: Yes Periwound Skin Moisture: Maceration: No No Abnormalities Noted Hemosiderin Staining: Yes Hemosiderin Staining: Yes Periwound Skin Color: No Abnormality No Abnormality No Abnormality Temperature: N/A Yes N/A Tenderness on Palpation: N/A Debridement N/A Procedures Performed: Wound Number: 31 N/A N/A Photos: N/A N/A Left, Medial Lower Leg N/A N/A Wound Location: Gradually Appeared N/A N/A Wounding Event: T be determined o N/A N/A Primary Etiology: Glaucoma, Sleep Apnea, N/A N/A Comorbid History: Hypertension, Peripheral Arterial Disease, Peripheral Venous Disease, Type II Diabetes, Gout, Osteoarthritis, Neuropathy 06/28/2022 N/A N/A Date A cquired: 2 N/A N/A Weeks of Treatment: Healed - Epithelialized N/A N/A Wound Status: No N/A N/A Wound Recurrence: No N/A N/A Clustered Wound: N/A N/A N/A Clustered Quantity: 0x0x0 N/A N/A Measurements L x W x D (cm) 0 N/A N/A A (cm) : rea 0 N/A N/A Volume (cm) : 97.50% N/A N/A % Reduction in A rea: 96.80% N/A N/A % Reduction in Volume: Full Thickness Without Exposed N/A N/A Classification: Support Structures None Present N/A N/A Exudate A mount: N/A N/A N/A Exudate Type: N/A N/A N/A Exudate Color: Distinct, outline attached N/A N/A Wound Margin: Large (67-100%) N/A N/A Granulation A mount: Red N/A N/A Granulation Quality: Small (1-33%) N/A N/A Necrotic A mount: Eschar, Adherent Slough N/A N/A Necrotic Tissue: Fat Layer (Subcutaneous Tissue): Yes N/A N/A Exposed Structures: Fascia: No Tendon: No Muscle: No Joint: No Bone: No None N/A N/A Epithelialization: N/A N/A N/A Debridement: N/A N/A N/A Pain Control: N/A N/A N/A Tissue Debrided: N/A N/A N/A Level: N/A N/A N/A Debridement A (sq cm): rea N/A N/A N/A Instrument: N/A N/A N/A Bleeding: N/A N/A N/A Hemostasis A chieved: Debridement Treatment Response: N/A N/A N/A Post Debridement Measurements L x N/A N/A N/A W x D  (cm) N/A N/A N/A Post Debridement Volume: (cm) Scarring: Yes N/A N/A Periwound Skin Texture: Dry/Scaly: Yes N/A N/A Periwound Skin Moisture: Hemosiderin Staining: Yes N/A N/A Periwound Skin Color: No Abnormality N/A N/A Temperature: N/A N/A N/A Procedures Performed: [name] ([id number]) [id number] Page 4 of 9 Treatment Notes Electronic Signature(s) Signed: 07/12/2022 1:27:57 PM By: Fredirick Maudlin MD FACS Entered By: Fredirick Maudlin on 07/12/2022 13:27:57 -------------------------------------------------------------------------------- Pain Assessment Details Patient Name: Date of Service: [name]. 07/12/2022 12:30 PM Medical Record Number: [id number] Patient Account Number: [id number] Date of Birth/Sex: Treating RN: 07-06-1950 (72 y.o. [name] Primary Care Nili Honda: [name] Other Clinician: Referring Tabbitha Janvrin: Treating Adonte Vanriper/Extender: [name] in Treatment: 91 Active Problems  Location of Pain Severity and Description of Pain Patient Has Paino No Site Locations Rate the pain. Current Pain Level: 0 Pain Management and Medication Current Pain Management: Electronic Signature(s) Signed: 07/12/2022 4:47:13 PM By: Blanche East RN Entered By: Blanche East on 07/12/2022 12:52:36 -------------------------------------------------------------------------------- Wound Assessment Details Patient Name: Date of Service: [name]. 07/12/2022 12:30 PM Medical Record Number: [id number] Patient Account Number: [id number] Date of Birth/Sex: Treating RN: 1950/11/12 (72 y.o. [name] Primary Care Felicia Bloomquist: [name] Other Clinician: Referring Juwann Sherk: Treating Marieke Lubke/Extender: [name] in Treatment: 65 Wound Status Wound Number: [id number] Primary Diabetic Wound/Ulcer of the Lower Extremity Etiology: [name], [name] ([id number])  [id number] Page 5 of 9 Etiology: Wound Location: Left, Plantar Metatarsal head first Wound Open Wounding Event: Gradually Appeared Status: Date Acquired: 08/23/2020 Comorbid Glaucoma, Sleep Apnea, Hypertension, Peripheral Arterial Disease, Weeks Of Treatment: 65 History: Peripheral Venous Disease, Type II Diabetes, Gout, Osteoarthritis, Clustered Wound: No Neuropathy Photos Wound Measurements Length: (cm) 1.8 Width: (cm) 2 Depth: (cm) 0.1 Area: (cm) 2.827 Volume: (cm) 0.283 % Reduction in Area: 80% % Reduction in Volume: 90% Epithelialization: Small (1-33%) Tunneling: No Undermining: No Wound Description Classification: Grade 2 Wound Margin: Flat and Intact Exudate Amount: Medium Exudate Type: Serosanguineous Exudate Color: red, brown Foul Odor After Cleansing: No Slough/Fibrino No Wound Bed Granulation Amount: Small (1-33%) Exposed Structure Granulation Quality: Red Fascia Exposed: No Necrotic Amount: Large (67-100%) Fat Layer (Subcutaneous Tissue) Exposed: Yes Necrotic Quality: Adherent Slough Tendon Exposed: No Muscle Exposed: No Joint Exposed: No Bone Exposed: No Periwound Skin Texture Texture Color No Abnormalities Noted: No No Abnormalities Noted: Yes Callus: Yes Temperature / Pain Temperature: No Abnormality Moisture No Abnormalities Noted: No Dry / Scaly: Yes Maceration: No Electronic Signature(s) Signed: 07/12/2022 4:47:13 PM By: Blanche East RN Entered By: Blanche East on 07/12/2022 12:56:31 -------------------------------------------------------------------------------- Wound Assessment Details Patient Name: Date of Service: [name]. 07/12/2022 12:30 PM Medical Record Number: [id number] Patient Account Number: [id number] Date of Birth/Sex: Treating RN: 06-24-1951 (72 y.o. [name] Primary Care Lilliane Sposito: [name] Other Clinician: Referring Tallen Schnorr: Treating Adamariz Gillott/Extender: [name] in Treatment: [address] [place] ([id number]) [id number] Page 6 of 9 Wound Status Wound Number: 22 Primary Cyst Etiology: Wound Location: Left, Proximal, Lateral Lower Leg Wound Open Wounding Event: Bump Status: Date Acquired: 06/03/2021 Comorbid Glaucoma, Sleep Apnea, Hypertension, Peripheral Arterial Disease, Weeks Of Treatment: 57 History: Peripheral Venous Disease, Type II Diabetes, Gout, Osteoarthritis, Clustered Wound: Yes Neuropathy Photos Wound Measurements Length: (cm) Width: (cm) Depth: (cm) Clustered Quantity: Area: (cm) Volume: (cm) 5.2 % Reduction in Area: -345.8% 1.8 % Reduction in Volume: 44.3% 0.1 Epithelialization: Medium (34-66%) 3 Tunneling: No 7.351 Undermining: No 0.735 Wound Description Classification: Full Thickness With Exposed Suppo Wound Margin: Fibrotic scar, thickened scar Exudate Amount: Medium Exudate Type: Serosanguineous Exudate Color: red, brown rt Structures Foul Odor After Cleansing: No Slough/Fibrino Yes Wound Bed Granulation Amount: Large (67-100%) Exposed Structure Granulation Quality: Red, Pink Fascia Exposed: No Necrotic Amount: Small (1-33%) Fat Layer (Subcutaneous Tissue) Exposed: Yes Necrotic Quality: Eschar, Adherent Slough Tendon Exposed: No Muscle Exposed: No Joint Exposed: No Bone Exposed: No Periwound Skin Texture Texture Color No Abnormalities Noted: No No Abnormalities Noted: No Scarring: Yes Hemosiderin Staining: Yes Moisture Temperature / Pain No Abnormalities Noted: No Temperature: No Abnormality Dry / Scaly: Yes Tenderness on Palpation: Yes Electronic Signature(s) Signed: 07/12/2022 4:47:13 PM By: Blanche East RN Entered By: Blanche East on 07/12/2022 12:59:14 -------------------------------------------------------------------------------- Wound Assessment Details  Patient Name: Date of Service: [name], [name] 07/12/2022 12:30 PM Medical  Record Number: [id number] Patient Account Number: [id number] Date of Birth/Sex: Treating RN: April 08, 1951 (72 y.o. [name] Primary Care Tico Crotteau: [name] Other Clinician: [name] ([id number]) [id number] Page 7 of 9 Referring Jameyah Fennewald: Treating Aracelys Glade/Extender: [name] in Treatment: 27 Wound Status Wound Number: 30 Primary Abrasion Etiology: Wound Location: Left, Anterior Lower Leg Wound Open Wounding Event: Shear/Friction Status: Date Acquired: 06/21/2022 Comorbid Glaucoma, Sleep Apnea, Hypertension, Peripheral Arterial Disease, Weeks Of Treatment: 3 History: Peripheral Venous Disease, Type II Diabetes, Gout, Osteoarthritis, Clustered Wound: No Neuropathy Photos Wound Measurements Length: (cm) 0.5 Width: (cm) 0.3 Depth: (cm) 0.1 Area: (cm) 0.118 Volume: (cm) 0.012 % Reduction in Area: 70% % Reduction in Volume: 69.2% Epithelialization: Large (67-100%) Tunneling: No Undermining: No Wound Description Classification: Full Thickness Without Exposed Suppo Wound Margin: Distinct, outline attached Exudate Amount: Medium Exudate Type: Serous Exudate Color: amber rt Structures Foul Odor After Cleansing: No Slough/Fibrino Yes Wound Bed Granulation Amount: Large (67-100%) Exposed Structure Granulation Quality: Red Fascia Exposed: No Necrotic Amount: Small (1-33%) Fat Layer (Subcutaneous Tissue) Exposed: Yes Necrotic Quality: Adherent Slough Tendon Exposed: No Muscle Exposed: No Joint Exposed: No Bone Exposed: No Periwound Skin Texture Texture Color No Abnormalities Noted: Yes No Abnormalities Noted: No Hemosiderin Staining: Yes Moisture No Abnormalities Noted: No Temperature / Pain Maceration: Yes Temperature: No Abnormality Electronic Signature(s) Signed: 07/12/2022 4:47:13 PM By: Blanche East RN Entered By: Blanche East on 07/12/2022  12:58:01 -------------------------------------------------------------------------------- Wound Assessment Details Patient Name: Date of Service: [name]. 07/12/2022 12:30 PM Medical Record Number: [id number] Patient Account Number: [id number] Date of Birth/Sex: Treating RN: 09-Jan-1951 (72 y.o. [name], [name], [name] ([id number]) [id number] Page 8 of 9 Primary Care Timya Trimmer: [name] Other Clinician: Referring Lerin Jech: Treating Joselyne Spake/Extender: [name] in Treatment: 68 Wound Status Wound Number: 79 Primary T be determined o Etiology: Wound Location: Left, Medial Lower Leg Wound Healed - Epithelialized Wounding Event: Gradually Appeared Status: Date Acquired: 06/28/2022 Comorbid Glaucoma, Sleep Apnea, Hypertension, Peripheral Arterial Disease, Weeks Of Treatment: 2 History: Peripheral Venous Disease, Type II Diabetes, Gout, Osteoarthritis, Clustered Wound: No Neuropathy Photos Wound Measurements Length: (cm) Width: (cm) Depth: (cm) Area: (cm) Volume: (cm) 0 % Reduction in Area: 97.5% 0 % Reduction in Volume: 96.8% 0 Epithelialization: None 0 Tunneling: No 0 Undermining: No Wound Description Classification: Full Thickness Without Exposed Suppor Wound Margin: Distinct, outline attached Exudate Amount: None Present t Structures Foul Odor After Cleansing: No Slough/Fibrino Yes Wound Bed Granulation Amount: Large (67-100%) Exposed Structure Granulation Quality: Red Fascia Exposed: No Necrotic Amount: Small (1-33%) Fat Layer (Subcutaneous Tissue) Exposed: Yes Necrotic Quality: Eschar, Adherent Slough Tendon Exposed: No Muscle Exposed: No Joint Exposed: No Bone Exposed: No Periwound Skin Texture Texture Color No Abnormalities Noted: No No Abnormalities Noted: No Scarring: Yes Hemosiderin Staining: Yes Moisture Temperature / Pain No Abnormalities Noted: No Temperature: No Abnormality Dry  / Scaly: Yes Electronic Signature(s) Signed: 07/12/2022 4:47:13 PM By: Blanche East RN Entered By: Blanche East on 07/12/2022 13:12:52 -------------------------------------------------------------------------------- Vitals Details Patient Name: Date of Service: [name]. 07/12/2022 12:30 PM Medical Record Number: [id number] Patient Account Number: [id number] Date of Birth/Sex: Treating RN: Jan 15, 1951 (72 y.o. [name], [name], [name] ([id number]) [id number] Page 9 of 9 Primary Care Yasin Ducat: [name] Other Clinician: Referring Daruis Swaim: Treating Marine Lezotte/Extender: [name] in Treatment: 28 Vital Signs Time Taken: 12:50 Temperature (F): 97.9  Height (in): 74 Pulse (bpm): 74 Weight (lbs): 238 Respiratory Rate (breaths/min): 18 Body Mass Index (BMI): 30.6 Blood Pressure (mmHg): 170/78 Capillary Blood Glucose (mg/dl): 119 Reference Range: 80 - 120 mg / dl Electronic Signature(s) Signed: 07/12/2022 4:47:13 PM By: Blanche East RN Entered By: Blanche East on 07/12/2022 12:52:31

## 2022-07-13 NOTE — Progress Notes (Signed)
[name], [name] ([id number]) [id number] Page 1 of 20 Visit Report for 07/12/2022 Chief Complaint Document Details Patient Name: Date of Service: [name], [name] 07/12/2022 12:30 PM Medical Record Number: [id number] Patient Account Number: [id number] Date of Birth/Sex: Treating RN: 04-Apr-1951 (72 y.o. M) Primary Care Provider: Jilda [name] Other Clinician: Referring Provider: Treating Provider/Extender: [name] in Treatment: 28 Information Obtained from: Patient Chief Complaint Left leg and foot ulcers 04/12/2021; patient is here for wounds on his left lower leg and left plantar foot over the first metatarsal head Electronic Signature(s) Signed: 07/12/2022 1:29:09 PM By: [name] Maudlin MD FACS Entered By: [name] on 07/12/2022 13:29:08 -------------------------------------------------------------------------------- Debridement Details Patient Name: Date of Service: [name]. 07/12/2022 12:30 PM Medical Record Number: [id number] Patient Account Number: [id number] Date of Birth/Sex: Treating RN: Apr 12, 1951 (72 y.o. [name] Primary Care Provider: Jilda [name] Other Clinician: Referring Provider: Treating Provider/Extender: [name] in Treatment: 65 Debridement Performed for Assessment: Wound #22 Left,Proximal,Lateral Lower Leg Performed By: Physician [name] Maudlin, MD Debridement Type: Debridement Level of Consciousness (Pre-procedure): Awake and Alert Pre-procedure Verification/Time Out Yes - 13:13 Taken: Start Time: 13:14 Pain Control: Lidocaine 5% topical ointment T Area Debrided (L x W): otal 5.2 (cm) x 1.8 (cm) = 9.36 (cm) Tissue and other material debrided: Slough, Slough Level: Non-Viable Tissue Debridement Description: Selective/Open Wound Instrument: Curette Bleeding: Minimum Hemostasis Achieved: Pressure Response to Treatment: Procedure was tolerated  well Level of Consciousness (Post- Awake and Alert procedure): Post Debridement Measurements of Total Wound Length: (cm) 5.2 Width: (cm) 1.8 Depth: (cm) 0.1 Volume: (cm) 0.735 Character of Wound/Ulcer Post Debridement: Requires Further Debridement Post Procedure Diagnosis Same as [name] ([id number]) [id number] Page 2 of 20 Notes Scribed for Dr. Celine [name] by [name] East, RN Electronic Signature(s) Signed: 07/12/2022 4:47:13 PM By: [name] East RN Signed: 07/13/2022 7:36:34 AM By: [name] Maudlin MD FACS Entered By: [name] on 07/12/2022 13:15:10 -------------------------------------------------------------------------------- HPI Details Patient Name: Date of Service: [name]. 07/12/2022 12:30 PM Medical Record Number: [id number] Patient Account Number: [id number] Date of Birth/Sex: Treating RN: 1950-08-24 (72 y.o. M) Primary Care Provider: Jilda [name] Other Clinician: Referring Provider: Treating Provider/Extender: [name] in Treatment: 37 History of Present Illness HPI Description: 10/11/17; [name] is a 72 year old man who tells me that in 2015 he slipped down the latter traumatizing his left leg. He developed a wound in the same spot the area that we are currently looking at. He states this closed over for the most part although he always felt it was somewhat unstable. In 2016 he hit the same area with the door of his car had this reopened. He tells me that this is never really closed although sometimes an inflow it remains open on a constant basis. He has not been using any specific dressing to this except for topical antibiotics the nature of which were not really sure. His primary doctor did send him to see Dr. Einar [name] of interventional cardiology. He underwent an angiogram on 08/06/17 and he underwent a PTA and directional atherectomy of the lesser distal SFA and popliteal arteries which  resulted in brisk improvement in blood flow. It was noted that he had 2 vessel runoff through the anterior tibial and peroneal. He is also been to see vascular and interventional radiologist. He was not felt to have any significant superficial venous insufficiency. Presumably is not a candidate for any ablation. It was suggested he come here  for wound care. The patient is a type II diabetic on insulin. He also has a history of venous insufficiency. ABIs on the left were noncompressible in our clinic 10/21/17; patient we admitted to the clinic last week. 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 We put Iodosorb on him after an aggressive debridement and 3 layer compression. He complained of pain in his ankle and itching with is skin in fact he scratched the area on the medial calf superiorly at the rim of our wraps and he has 2 small open areas in that location today which are new. I changed his primary dressing today to silver collagen. As noted he is already had revascularization and does not have any significant superficial venous insufficiency that would be amenable to ablation 10/28/17; patient admitted to the clinic 2 weeks ago. He has a smaller Wound. Scratch injury from last week revealed. There is large wound over the tibial area. This is smaller. Granulation looks healthy. No need for debridement. 11/04/17; the wound on the left lateral calf looks better. Improved dimensions. Surface of this looks better. We've been maintaining him and Kerlix Coban wraps. He finds this much more comfortable. Silver collagen dressing 11/11/17; left lateral Wound continues to look healthy be making progress. Using a #5 curet I removed removed nonviable skin from the surface of the wound and then necrotic debris from the wound surface. Surface of the wound continues to look healthy. He also has an open area on the left great toenail bed. We've been  using topical antibiotics. 11/19/17; left anterior lateral wound continues to look healthy but it's not closed. He also had a small wound above this on the left leg Initially traumatic wounds in the setting of significant chronic venous insufficiency and stasis dermatitis 11/25/17; left anterior wounds superiorly is closed still a small wound inferiorly. 12/02/17; left anterior tibial area. Arrives today with adherent callus. Post debridement clearly not completely closed. Hydrofera Blue under 3 layer compression. 12/09/17; left anterior tibia. Circumferential eschar however the wound bed looks stable to improved. We've been using Hydrofera Blue under 3 layer compression 12/17/17; left anterior tibia. Apparently this was felt to be closed however when the wrap was taken off there is a skin tear to reopen wounds in the same area we've been using Hydrofera Blue under 3 layer compression 12/23/17 left anterior tibia. Not close to close this week apparently the [facility] was stuck to this again. Still circumferential eschar requiring debridement. I put a contact layer on this this time under the Hydrofera Blue 12/31/17; left anterior tibia. Wound is better slight amount of hyper-granulation. Using Hydrofera Blue over Adaptic. 01/07/18; left anterior tibia. The wound had some surface eschar however after this was removed he has no open wound.he was already revascularized by Dr. Einar [name] when he came to our clinic with atherectomy of the left SFA and popliteal artery. He was also sent to interventional radiology for venous reflux studies. He was not felt to have significant reflux but certainly has chronic venous changes of his skin with hemosiderin deposition around this area. He will definitely need to lubricate his skin and wear compression stocking and I've talked to him about this. READMISSION 05/26/2018 This is a now 72 year old man we cared for with traumatic wounds on his left anterior lower extremity. He  had been previously revascularized during that admission by Dr. Einar [name]. Apparently in follow-up Dr. Einar [name] noted that he had deterioration  in his arterial status. He underwent a stent placement in the distal left SFA on 04/22/2018. 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 He went back to the angiography suite on 04/30/2018 he underwent PTA and balloon angioplasty of the occluded left mid anterior tibial artery, thrombotic occlusion went from 100 to 0% which reconstitutes the posterior tibial artery. He had thrombectomy and aspiration of the peroneal artery. The stent placed in the distal SFA left SFA was still occluded. 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 He has been recuperating at [facility] in [facility] after the most recent hospitalization. He is going home tomorrow. He tells me that 2 weeks ago he traumatized the tip of his left fifth toe. He came in urgently for our review of this. This was a history of before I noted that Dr. Einar [name] had already noted dry gangrenous changes of the left fifth toe [name], [name] ([id number]) [id number] Page 3 of 20 06/09/2018; 2-week follow-up. I did contact Dr. Einar [name] after his last appointment and he apparently saw 1 of Dr. Irven Shelling colleagues the next day. He does not follow-up with Dr. Einar [name] himself until Thursday of this week. 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 Nevertheless there is no evidence of infection no drainage and no pain. He had a new area that this week when we were signing him in today on the left anterior mid tibia area, this is in close proximity to the previous wound we have dealt with in this clinic. 06/23/2018;  2-week follow-up. I did not receive a recent note from Dr. Einar [name] to review today. Our office is trying to obtain this. He is apparently not planning to do further vascular interventions and wondered about compression to try and help with the patient's chronic venous insufficiency. However we are also concerned about the arterial flow. He arrives in clinic today with a new area on the left third toe. The areas on the calf/anterior tibia are close to closing. The left fifth toe is still mummified using Betadine. -In reviewing things with the patient he has what sounds like claudication with mild to moderate amount of activity. 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 I prescribed Levaquin over the phone while we attempted to arrange a plan of care. However the patient called yesterday to report he had low-grade fever and he came in today acutely. There is been a marked deterioration in the left third toe with spreading cellulitis up into the dorsal left foot. He was referred to the emergency room. Readmission: 06/29/2020 patient presents today for reevaluation here in our clinic he was previously treated by Dr. Dellia Nims at the latter part of 2019 in 2 the beginning of 2020. Subsequently we have not seen him since that time in the interim he did have evaluation with vein and vascular specialist specifically Dr. Anice Paganini who did perform quite extensive work for a left femoral to anterior tibial artery bypass. With that being said in the interim the patient has developed significant lymphedema and has wounds that he tells me have really never healed in regard to the incision site on the left leg. He also has multiple wounds on  the feet for various reasons some of which is that he tends to pick at his feet. Fortunately there is no signs of active infection systemically at this time he does have some wounds that are little bit deeper but most are fairly superficial he seems  to have good blood flow and overall everything appears to be healthy I see no bone exposed and no obvious signs of osteomyelitis. I do not know that he necessarily needs a x-ray at this point although that something we could consider depending on how things progress. The patient does have a history of lymphedema, diabetes, this is type II, chronic kidney disease stage III, hypertension, and history of peripheral vascular disease. 07/05/2020; patient admitted last week. Is a patient I remember from 2019 he had a spreading infection involving the left foot and we sent him to the hospital. 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 He subsequently had a left femoral to anterior tibial bypass by Dr.Cain vein and vascular. He also has severe lymphedema with chronic skin changes related to that on the left leg. The most problematic area that was new today was on the left medial great toe. This was apparently a small area last week there was purulent drainage which our intake nurse cultured. Also areas on the left medial foot and heel left lateral foot. He has 2 areas on the left medial calf left lateral calf in the setting of the severe lymphedema. 07/13/2020 on evaluation today patient appears to be doing better in my opinion compared to his last visit. The good news is there is no signs of active infection systemically and locally I do not see any signs of infection either. He did have an x-ray which was negative that is great news he had a culture which showed MRSA but at the same time he is been on the doxycycline which has helped. I do think we may want to extend this for 7 additional days 1/25; patient admitted to the clinic a few weeks ago. 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 We have been putting him under compression his edema control is a lot better but he is severe verricused skin on the left leg. He is really  done quite well he still has an open area on the left medial calf and the left medial first metatarsal head. We have been using silver collagen on the leg silver alginate on the foot 07/27/2020 upon evaluation today patient appears to be doing decently well in regard to his wounds. He still has a lot of dry skin on the left leg. Some of this is starting to peel back and I think he may be able to have them out by removing some that today. Fortunately there is no signs of active infection at this time on the left leg although on the right leg he does appear to have swelling and erythema as well as some mild warmth to touch. This does have been concerned about the possibility of cellulitis although within the differential diagnosis I do think that potentially a DVT has to be at least considered. We need to rule that out before proceeding would just call in the cellulitis. Especially since he is having pain in the posterior aspect of his calf muscle. 2/8; the patient had seen sparingly. He has severe skin changes of chronic lymphedema in the left leg thickened hyperkeratotic verrucous skin. He has an  open wound on the medial part of the left first met head left mid tibia. He also has a rim of nonepithelialized skin in the anterior mid tibia. He brought in the AmLactin lotion that was been prescribed although I am not sure under compression and its utility. There concern about cellulitis on the right lower leg the last time he was here. He was put on on antibiotics. His DVT rule out was negative. The right leg looks fine he is using his stocking on this area 08/10/2020 upon evaluation today patient appears to be doing well with regard to his leg currently. He has been tolerating the dressing changes without complication. Fortunately there is no signs of active infection which is great news. Overall very pleased with where things stand. 2/22; the patient still has an area on the medial part of the left first  met his head. This looks better than when I last saw this earlier this month he has a rim of epithelialization but still some surface debris. Mostly everything on the left leg is healed. There is still a vulnerable in the left mid tibia area. 08/30/2020 upon evaluation today patient appears to be doing much better in regard to his wounds on his foot. 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 Nonetheless I think we will address that today I Minna send in a prescription for him in that regard. Overall though there does not appear to be any signs of significant worsening. 09/07/2020 on evaluation today patient's wounds over his left foot appear to be doing excellent. I do not see any signs of infection there is some callus buildup this can require debridement for certain but overall I feel like he is managing quite nicely. He still using the AmLactin cream which has been beneficial for him as well. 3/22; left foot wound is closed. There is no open area here. He is using ammonium lactate lotion to the lower extremities to help exfoliate dry cracked skin. He has compression stockings from elastic therapy in [place]. The wound on the medial part of his left first met head is healed today. READMISSION 04/12/2021 Mr. Pottle is a patient we know fairly well he had a prolonged stay in clinic in 2019 with wounds on his left lateral and left anterior lower extremity in the setting of chronic venous insufficiency. More recently he was here earlier this year with predominantly an area on his left foot first metatarsal head plantar and he says the plantar foot broke down on its not long after we discharged him but he did not come back here. The last few months areas of broken down on his left anterior and again the left lateral lower extremity. The leg itself is very swollen  chronically enlarged a lot of hyperkeratotic dry Berry Q skin in the left lower leg. His edema extends well into the thigh. He was seen by Dr. Donzetta Matters. He had ABIs on 03/02/2021 showing an ABI on the right of 1 with a TBI of 0.72 his ABI in the left at 1.09 TBI of 0.99. Monophasic and biphasic waveforms on the right. On the left monophasic waveforms were noted he went on to have an angiogram on 03/27/2021 this showed the aortic aortic and iliac segments were free of flow-limiting stenosis the left common femoral vein to evaluate the left femoral to anterior tibial artery bypass was  unobstructed the bypass was patent without any areas of stenosis. We discharged the patient in bilateral juxta lite stockings but very clearly that was not sufficient to control the swelling and maintain skin integrity. He is clearly going to need compression pumps. 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 This is fortunate because he is on his feet for long periods of time. 10/27; patient comes in with our intake nurse reporting copious amount of green drainage from the left anterior mid tibia the left dorsal foot and to a lesser extent the left medial mid tibia. We left the compression wrap on all week for the amount of edema in his left leg is quite a bit better. We use silver alginate as the primary dressing 11/3; edema control is good. Left anterior lower leg left medial lower leg and the plantar first metatarsal head. The left anterior lower leg required debridement. Deep tissue culture I did of this wound showed MRSA I put him on 10 days of doxycycline which she will start today. We have him in compression wraps. He [name], [name] ([id number]) [id number] Page 4 of 20 has a security card and [employer] however he is retiring on November 15. We will need to then get him into a better offloading boot for the left foot perhaps a total contact  cast 11/10; edema control is quite good. Left anterior and left medial lower leg wounds in the setting of chronic venous insufficiency and lymphedema. He also has a substantial area over the left plantar first metatarsal head. 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 He has significant hypergranulation on the left plantar foot wound. The patient is a diabetic but he does not have significant PAD 11/17; edema control is quite good. Left anterior and left medial lower leg wounds look better. The really concerning area remains the area on the left plantar first metatarsal head. He has a rim of epithelialization. 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 Starting today with a forefoot off loader but . possibly a total contact cast. 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 Arrives with a very significant odor from I think all of the wounds on the left leg and the left foot. Because of this I did not put a total contact cast on him today but will could still consider this. His wife was having cataract surgery which is the reason he missed the appointment 12/6. I saw this man 5 days ago with a swelling below the popliteal fossa. I thought he actually might have a Baker's cyst however the DVT rule out study that we could arrange right away was negative the technician told me this was not a ruptured Baker's cyst. We attempted to get this aspirated by under ultrasound guidance in interventional radiology however all they did was an ultrasound however it shows an extensive fluid collection 62 x 8 x 9.4 in the left thigh and left calf. The patient states he thinks this started 8 days ago or so but  he really is not complaining of any pain, fever  or systemic symptoms. He has not ha 12/20; after some difficulty I managed to get the patient into see Dr. Donzetta Matters. Eventually he was taken into the hospital and had a drain put in the fluid collection below his left knee posteriorly extending into the posterior thigh. He still has the drain in place. Culture of this showed moderate staff aureus few Morganella and few Klebsiella he is now on doxycycline and ciprofloxacin as suggested by infectious disease he is on this for a month. The drain will remain in place until it stops draining 12/29; he comes in today with the 1 wound on his left leg and the area on the left plantar first met head significantly smaller. Both look healthy. He still has the drain in the left leg. He says he has to change this daily. Follows up with Dr. Donzetta Matters on January 11. 06/29/2021; the wounds that I am following on the left leg and left first met head continued to be quite healthy. However the area where his inferior drain is in place had copious amounts of drainage which was green in color. The wound here is larger. Follows up with Dr. Gwenlyn Saran of vein and vascular his surgeon next week as well as infectious disease. He remains on ciprofloxacin and doxycycline. He is not complaining of excessive pain in either one of the drain areas 1/12; the patient saw vascular surgery and infectious disease. Vascular surgery has left the drain in place as there was still some notable drainage still see him back in 2 weeks. Dr. Velna Ochs stop the doxycycline and ciprofloxacin and I do not believe he follows up with them at this point. Culture I did last week showed both doxycycline resistant MRSA and Pseudomonas not sensitive to ciprofloxacin although only in rare titers 1/19; the patient's wound on the left anterior lower leg is just about healed. We have continued healing of the area that was medially on the left leg. Left first plantar metatarsal head  continues to get smaller. The major problem here is his 2 drain sites 1 on the left upper calf and lateral thigh. There is purulent drainage still from the left lateral thigh. I gave him antibiotics last week but we still have recultured. He has the drain in the area I think this is eventually going to have to come out. I suspect there will be a connecting wound to heal here perhaps with improved VAc 1/26; the patient had his drain removed by vein and vascular on 1/25/. This was a large pocket of fluid in his left thigh that seem to tunnel into his left upper calf. 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 His mention his Penrose drain was removed today. He now has a tunneling wound on his left calf and left thigh. Both of these probe widely towards each other although I cannot really prove that they connect. Both wounds on his lower leg anteriorly are closed and his area over the first metatarsal head on his right foot continues to improve. We are using Hydrofera Blue here. He also saw infectious disease culture of the abscess they noted was polymicrobial with MRSA, Morganella and Klebsiella he was treated with doxycycline and ciprofloxacin for 4 weeks ending on 07/03/2021. They did not recommend any further antibiotics. Notable that while he still had the Penrose drain in place last week he had purulent drainage coming out of the inferior IandD site this grew [facility], MRSA and Pseudomonas but there does not appear to  be any active infection in this area today with the drain out and he is not systemically unwell 2/2; with regards to the drain sites the superior one on the thigh actually is closed down the one on the upper left lateral calf measures about 8 and half centimeters which is an improvement seems to be less prominent although still with a lot of drainage. The only remaining wound is over the first metatarsal head on the left foot and this looks to be continuing to  improve with Hydrofera Blue. 2/9; the area on his plantar left foot continues to contract. Callus around the wound edge. The drain sites specifically have not come down in depth. We put the wound VAC on Monday he changed the canister late last night our intake nurse reported a pocket of fluid perhaps caused by our compression wraps 2/16; continued improvement in left foot plantar wound. drainage site in the calf is not improved in terms of depth (wound vac) 2/23; continued improvement in the left foot wound over the first metatarsal head. With regards to the drain sites the area on his thigh laterally is healed however the open area on his calf is small in terms of circumference by still probes in by about 15 cm. Within using the wound VAC. Hydrofera Blue on his foot 08/24/2021: The left first metatarsal head wound continues to improve. The wound bed is healthy with just some surrounding callus. Unfortunately the open drain site on his calf remains open and tunnels at least 15 cm (the extent of a Q-tip). This is despite several weeks of wound VAC treatment. Based on reading back through the notes, there has been really no significant change in the depth of the wound, although the orifice is smaller and the more cranial wound on his thigh has closed. I suspect the tunnel tracks nearly all the way to this location. 08/31/2021: Continued improvement in the left first metatarsal head wound. There has been absolutely no improvement to the long tunnel from his open drain site on his calf. We have tried to get him into see vascular surgery sooner to consider the possibility of simply filleting the tract open and allowing it to heal from the bottom up, likely with a wound VAC. They have not yet scheduled a sooner appointment than his current mid April 09/14/2021: He was seen by vascular surgery and they took him to the operating room last week. They opened a portion of the tunnel, but did not extend the entire  length of the known open subcutaneous tract. I read Dr. Claretha Cooper operative note and it is not clear from that documentation why only a portion of the tract was opened. The heaped up granulation tissue was curetted and removed from at least some portion of the tract. They did place a wound VAC and applied an Unna boot to the leg. The ulcer on his left first metatarsal head is smaller today. The bed looks good and there is just a small amount of surrounding callus. 09/21/2021: The ulcer on his left first metatarsal head looks to be stalled. There is some callus surrounding the wound but the wound bed itself does not appear particularly dynamic. The tunnel tract on his lateral left leg seems to be roughly the same length or perhaps slightly smaller but the wound bed appears healthy with good granulation tissue. He opened up a new wound on his medial thigh and the site of a prior surgical incision. He says that he did this unconsciously in  his sleep by scratching. 09/28/2021: Unfortunately, the ulcer on his left first metatarsal head has extended underneath the callus toward the dorsum of his foot. The medial thigh wounds are roughly the same. The tunnel on his lateral left leg continues to be problematic; it is longer than we are able to actually probe with a Q-tip. I am still not certain as to why Dr. Donzetta Matters did not open this up entirely when he took the patient to the operating room. We will likely be back in the same situation with just a small superficial opening in a long unhealed tract, as the open portion is granulating in nicely. [name], [name] ([id number]) [id number] Page 5 of 20 10/02/2021: The patient was initially scheduled for a nurse visit, but we are also applying a total contact cast today. The plantar foot wound looks clean without significant accumulated callus. We have been applying Prisma silver collagen to the site. 10/05/2021: The patient is here for his first  total contact cast change. We have tried using gauze packing strips in the tunnel on his lateral leg wound, but this does not seem to be working any better than the white VAC foam. The foot ulcer looks about the same with minimal periwound callus. Medial thigh wound is clean with just some overlying eschar. 10/12/2021: The plantar foot wound is stable without any significant accumulation of periwound callus. The surface is viable with good granulation tissue. The medial thigh wounds are much smaller and are epithelializing. On the other hand, he had purulent drainage coming from the tunnel on his lateral leg. He does go back to see Dr. Donzetta Matters next week and is planning to ask him why the wound tunnel was not completely opened at the time of his most recent operation. 10/19/2021: The plantar foot wound is markedly improved and has epithelial tissue coming through the surface. The medial thigh wounds are nearly closed with just a tiny open area. He did see Dr. Donzetta Matters earlier this week and apparently they did discuss the possibility of opening the sinus tract further and enabling a wound VAC application. Apparently there are some limits as to what Dr. 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 I think if we could get the tract open to the level of the popliteal fossa, this would greatly aid in her ability to get this chart closed. That being said, however, today when I probed the tract with a Q-tip, I was not able to insert the entirety of the Q-tip as I have on previous occasions. The tunnel is shorter by about 4 cm. The surface is clean with good granulation tissue and no further episodes of purulent drainage. 10/30/2021: Last week, the patient underwent surgery and had the long tract in his leg opened. There was a rind that was debrided, according to the operative report. His medial thigh ulcers are closed. The plantar foot wound is clean with a good surface and some built up  surrounding callus. 11/06/2021: The overall dimensions of the large wound on his lateral leg remain about the same, but there is good granulation tissue present and the tunneling is a little bit shorter. He has a new wound on his anterior tibial surface, in the same location where he had a similar lesion in the past. The plantar foot wound is clean with some buildup surrounding callus. Just toward the medial aspect of his foot, however, there is an area of darkening that once debrided, revealed another opening in the skin  surface. 11/13/2021: The anterior tibial surface wound is closed. The plantar foot wound has some surrounding callus buildup. The area of darkening that I debrided last week and revealed an opening in the skin surface has closed again. The tunnel in the large wound on his lateral leg has come in by about 3 cm. There is healthy granulation tissue on the entire wound surface. 11/23/2021: The patient was out of town last week and did wet-to-dry dressings on his large wound. 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 Unfortunately, he picked open the wound on his left medial thigh. He says that it was itching and he just could not stop scratching it until it was open again. The wound on his plantar foot is smaller and has not accumulated a tremendous amount of callus. The lateral leg wound is shallower and the tunnel has also decreased in depth. There is just a little bit of slough accumulation on the surface. 11/30/2021: Another portion of his left medial thigh has been opened up. All of these wounds are fairly superficial with just a little bit of slough and eschar accumulation. The wound on his plantar foot is almost closed with just a bit of eschar and periwound callus accumulation. The lateral leg wound is nearly flush with the surrounding skin and the tunnel is markedly shallower. 12/07/2021: There is just 1 open area on  his left medial thigh. It is clean with just a little bit of perimeter eschar. The wound on his plantar foot continues to contract and just has some eschar and periwound callus accumulation. The lateral leg wound is closing at the more distal aspect and the tunnel is smaller. The surface is nearly flush with the surrounding skin and it has a good bed of granulation tissue. 12/14/2021: The thigh and foot wounds are closed. The lateral leg wound has closed over approximately half of its length. The tunnel continues to contract and the surface is now flush with the surrounding skin. The wound bed has robust granulation tissue. 12/22/2021: The thigh and foot wounds have reopened. The foot wound has a lot of callus accumulation around and over it. The thigh wound is tiny with just a little bit of slough in the wound bed. The lateral leg wound continues to contract. His vascular surgeon took the wound VAC off earlier in the week and the patient has been doing wet-to-dry dressings. There is a little slough accumulation on the surface. The tunnel is about 3 cm in depth at this point. 12/28/2021: The thigh wound is closed again. The foot wound has some callus that subsequently has peeled back exposing just a small slit of a wound. The lateral leg wound Is down to about half the size that it originally was and the tunnel is down to about half a centimeter in depth. 01/04/2022: The thigh wound remains closed. The foot wound has heavy callus overlying the wound site. Once this was debrided, the wound was found to be closed. The lateral leg wound is smaller again this week and very superficial. No tunnel could be identified. 01/12/2022: The thigh and foot wounds both remain closed. The lateral leg wound is now nearly flush with the skin surface. There is good granulation tissue present with a light layer of slough. 01/19/2022: Due to the way his wrap was placed, the patient did not change the dressing on his thigh at all  and so the foam was saturated and  his skin is macerated. There is a light layer of slough on the wound surface. The underlying granulation tissue is robust and healthy-appearing. He has heavy callus buildup at the site of his first metatarsal head wound which is still healed. 02/01/2022: He has been in silver alginate. When he removed the dressing from his thigh wound, however, some leg, superficially reopening a portion of the wound that had healed. In addition, underneath the callus at his left first metatarsal head, there appears to be a blister and the wound appears to be open again. 02/08/2022: The lateral leg wound has contracted substantially. There is eschar and a light layer of slough present. He says that it is starting to pull and is uncomfortable. On inspection, there is some puckering of the scar and the eschar is quite dry; this may account for his symptoms. On his first metatarsal head, the wound is much smaller with just some eschar on the surface. The callus has not reaccumulated. He reports that he had a blister come up on his medial thigh wound at the distal aspect. It popped and there is now an opening in his skin again. Looking back through his [place] of wound photos, there is what looks like a permanent suture just deep to this location and it may be trying to erode through. We have been using silver alginate on his wounds. 02/15/2022: The lateral leg wound is about half the size it was last week. It is clean with just a little perimeter eschar and light slough. The wound on his first metatarsal head is about the same with heavy callus overlying it. The medial thigh wound is closed again. He does have some skin changes on the top of his foot that looks potentially yeast related. 02/22/2022: The skin on the top of his foot improved with the use of a topical antifungal. The lateral leg wound continues to contract and is again smaller this week. There is a little bit of slough and  eschar on the surface. The first metatarsal head wound is a little bit smaller but has reaccumulated a thick callus over the top. He decided to try to trim his toenail and ultimately took the entire nail off of his left great toe. 03/02/2022: His lateral leg wound continues to improve, as does the wound on his left great toe. Unfortunately, it appears that somehow his foot got wet and moisture seeped in through the opening causing his skin to lift. There is a large wound now overlying his first metatarsal on both the plantar, medial, and dorsal portion of his foot. There is necrotic tissue and slough present underneath the shaggy macerated skin. 03/08/2022: The lateral leg wound is smaller again today. There is just a light layer of slough and eschar on the surface. The great toe wound is smaller again today. The first metatarsal wound is a little bit smaller today and does not look nearly as necrotic and macerated. There is still slough and nonviable tissue present. 03/15/2022: The lateral leg wound is narrower and just has a little bit of light slough buildup. The first metatarsal wound still has a fair amount of moisture affecting the periwound skin. The great toe wound is healed. 03/22/2022: The lateral leg wound is now isolated to just at the level of his knee. There is some eschar and slough accumulation. The first metatarsal head [name], [name] ([id number]) [id number] Page 6 of 20 wound has epithelialized tremendously and is about half the size that it was last week.  He still has some maceration on the top of his foot and a fungal odor is present. 03/29/2022: T oday the patient's foot was macerated, suggesting that the cast got wet. The patient has also been picking at his dry skin and has enlarged the wound on his left lateral leg. In the time between having his cast removed and my evaluation, he had picked more dry skin and opened up additional wounds on his Achilles  area and dorsal foot. The plantar first metatarsal head wound, however, is smaller and clean with just macerated callus around the perimeter and light slough on the surface. The lateral leg wound measured a little bit larger but is also fairly clean with eschar and minimal slough. 04/02/2022: The patient had vascular studies done last Friday and so his cast was not applied. He is here today to have that done. 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 04/05/2022: Both wounds are smaller and quite clean. There is just a little biofilm on the lateral leg wound. 10/20; the patient has a wound on the left lateral surgical incision at the level of his lateral knee this looks clean and improved. He is using silver alginate. He also has an area on his left medial foot for which she is using Hydrofera Blue under a total contact cast both wounds are measuring smaller 04/20/2022: The plantar foot wound has contracted considerably and is very close to closing. The lateral leg wound was measured a little larger, but there was a tiny open area that was included in the measurements that was not included last week. He has some eschar around the perimeter but otherwise the wound looks clean. 04/27/2022: The lateral leg wound looks better this week. He says that midweek, he felt it was very dry and began applying hydrogel to the site. I think this was beneficial. The foot wound is nearly closed underneath a thick layer of dry skin and callus. 05/04/2022: The foot wound is healed. He has developed a new small ulcer on his anterior tibial surface about midway up his leg. It has a little slough on the surface. The lateral leg wound still is fairly dry, but clean with just a little biofilm on the surface. 05/11/2022: The wound on his foot reopened on Wednesday. A large blister formed which then broke open revealing the fat layer underneath. The ulcer on his anterior tibial surface is a little bit larger this  week. The lateral leg wound has much better moisture balance this week. Fortunately, prior to his foot wound reopening, he did get the cast made for his orthotic. 05/15/2022: Already, the left medial foot wound has improved. The tissue is less macerated and the surface is clean. The ulcer on his anterior tibial surface continues to enlarge. This seems likely secondary to accumulated moisture. The lateral leg wound continues to have an improved moisture balance with the use of collagen. 05/25/2022: The medial foot wound continues to contract. It is now substantially smaller with just a little slough on the surface. The anterior tibial surface wound continues to enlarge further. Once again, this seems to be secondary to moisture. The lateral leg wound does not seem to be changing much in size, but the moisture balance is better. 06/01/2022: The anterior tibial wound is closed. The medial foot wound is down to just a very small, couple of millimeters, opening. The lateral leg wound has good moisture balance, but remains unchanged in size. 12/15; the patient's anterior tibial wound has reopened, however  the area on his right first metatarsal head is closed. The major wound is actually on the superior part of his surgical wound in the left lateral thigh. Not a completely viable surface under illumination. This may at some point require a debridement I think he is currently using Prisma. As noted the left medial foot wound has closed 06/14/2022: The anterior tibial wound has closed. The lateral leg wound has a better surface but is basically unchanged in size. The left medial foot wound has reopened. It looks as though there was some callus accumulation and moisture got under the callus which caused the tissue to break down again. 06/21/2022: A new wound has opened up just distal to the previous anterior tibial wound. It is small but has hypertrophic granulation tissue present. The lateral leg wound is a  little bit narrower and has a layer of slough on the surface. The left medial foot wound is down to just a pinhole. His custom orthotics should be available next week. 06/28/2022: The wound on his first metatarsal head has healed. He has developed a new small wound on his medial lower leg, in an old scar site. The lateral leg wound continues to contract but continues to accumulate slough, as well. 07/03/2022: Despite wearing his custom orthopedic shoes, he managed to reopen the wound on his first metatarsal head. He says he thinks his foot got wet and then some skin lifted up and he peeled this away. Both of the lower leg wounds are smaller and have some dry eschar on the surface. The lateral leg wound is quite a bit narrower today. 07/12/2022: The medial lower leg wound is closed. The anterior lower leg wound has contracted considerably. The lateral upper leg wound is narrower with a layer of slough on the surface. The first metatarsal head wound is also smaller, but had copious drainage which saturated the foam border dressing and resulted in some periwound tissue maceration. Fortunately there was no breakdown at this site. Electronic Signature(s) Signed: 07/12/2022 1:30:54 PM By: [name] Maudlin MD FACS Entered By: [name] on 07/12/2022 13:30:54 -------------------------------------------------------------------------------- Physical Exam Details Patient Name: Date of Service: [name]. 07/12/2022 12:30 PM Medical Record Number: [id number] Patient Account Number: [id number] Date of Birth/Sex: Treating RN: [birth year] (72 y.o. M) Primary Care Provider: Jilda [name] Other Clinician: Referring Provider: Treating Provider/Extender: [name] in Treatment: [address], [place] ([id number]) [id number] Page 7 of 20 Constitutional Hypertensive, asymptomatic. . . . no acute distress. Respiratory Normal work of breathing on room  air. Notes 07/12/2022: The medial lower leg wound is closed. The anterior lower leg wound has contracted considerably. The lateral upper leg wound is narrower with a layer of slough on the surface. The first metatarsal head wound is also smaller, but had copious drainage which saturated the foam border dressing and resulted in some periwound tissue maceration. Fortunately there was no breakdown at this site. Electronic Signature(s) Signed: 07/12/2022 1:31:39 PM By: [name] Maudlin MD FACS Entered By: [name] on 07/12/2022 13:31:39 -------------------------------------------------------------------------------- Physician Orders Details Patient Name: Date of Service: [name]. 07/12/2022 12:30 PM Medical Record Number: [id number] Patient Account Number: [id number] Date of Birth/Sex: Treating RN: 04/14/1951 (72 y.o. [name] Primary Care Provider: Jilda [name] Other Clinician: Referring Provider: Treating Provider/Extender: [name] in Treatment: 67 Verbal / Phone Orders: No Diagnosis Coding ICD-10 Coding Code Description 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.828  Non-pressure chronic ulcer of other part of left lower leg with other specified severity E11.621 Type 2 diabetes mellitus with foot ulcer 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 I89.0 Lymphedema, not elsewhere classified I87.322 Chronic venous hypertension (idiopathic) with inflammation of left lower extremity Follow-up Appointments ppointment in 1 week. - Dr [name] - Room 1 Return A Anesthetic Wound #22 Left,Proximal,Lateral Lower Leg (In clinic) Topical Lidocaine 4% applied to wound bed Bathing/ Shower/ Hygiene May shower with protection but do not get wound dressing(s) wet. Protect dressing(s) with water repellant cover (for example, large plastic bag) or a cast cover and  may then take shower. Edema Control - Lymphedema / SCD / Other Avoid standing for long periods of time. Patient to wear own compression stockings every day. - on right leg; Moisturize legs daily. Compression stocking or Garment 20-30 mm/Hg pressure to: - left leg daily Off-Loading Total Contact Cast to Left Lower Extremity Other: - minimal weight bearing left foot Wound Treatment Wound #18R - Metatarsal head first Wound Laterality: Plantar, Left Cleanser: Soap and Water 1 x Per Week/30 Days Discharge Instructions: May shower and wash wound with dial antibacterial soap and water prior to dressing change. Cleanser: Wound Cleanser 1 x Per Week/30 Days Discharge Instructions: Cleanse the wound with wound cleanser prior to applying a clean dressing using gauze sponges, not tissue or cotton balls. Prim Dressing: Sorbalgon AG Dressing 2x2 (in/in) ary 1 x Per Week/30 Days KALUB, MORILLO ([id number]) [id number] Page 8 of 20 Discharge Instructions: Apply to wound bed as instructed Secondary Dressing: Zetuvit Plus Silicone Border Dressing 4x4 (in/in) 1 x Per Week/30 Days Discharge Instructions: Apply silicone border over primary dressing as directed. Wound #22 - Lower Leg Wound Laterality: Left, Lateral, Proximal Cleanser: Soap and Water 3 x Per Week/30 Days Discharge Instructions: May shower and wash wound with dial antibacterial soap and water prior to dressing change. Cleanser: Wound Cleanser 3 x Per Week/30 Days Discharge Instructions: Cleanse the wound with wound cleanser prior to applying a clean dressing using gauze sponges, not tissue or cotton balls. Peri-Wound Care: Sween Lotion (Moisturizing lotion) 3 x Per Week/30 Days Discharge Instructions: Apply moisturizing lotion to the leg Topical: Skintegrity Hydrogel 4 (oz) 3 x Per Week/30 Days Discharge Instructions: Apply hydrogel as directed Prim Dressing: Endoform 2x2 in 3 x Per Week/30 Days ary Discharge  Instructions: Moisten with Hydrogel or saline Secondary Dressing: ABD Pad, 8x10 (Generic) 3 x Per Week/30 Days Discharge Instructions: Apply over primary dressing as directed. Secured With: Elastic Bandage 4 inch (ACE bandage) 3 x Per Week/30 Days Discharge Instructions: Secure with ACE bandage as directed. Wound #30 - Lower Leg Wound Laterality: Left, Anterior Cleanser: Soap and Water 1 x Per Week/30 Days Discharge Instructions: May shower and wash wound with dial antibacterial soap and water prior to dressing change. Cleanser: Wound Cleanser 1 x Per Week/30 Days Discharge Instructions: Cleanse the wound with wound cleanser prior to applying a clean dressing using gauze sponges, not tissue or cotton balls. Prim Dressing: Sorbalgon AG Dressing 2x2 (in/in) 1 x Per Week/30 Days ary Discharge Instructions: Apply to wound bed as instructed Secondary Dressing: Zetuvit Plus Silicone Border Dressing 4x4 (in/in) 1 x Per Week/30 Days Discharge Instructions: Apply silicone border over primary dressing as directed. Electronic Signature(s) Signed: 07/13/2022 7:36:34 AM By: [name] Maudlin MD FACS Entered By: [name] on 07/12/2022 13:32:32 -------------------------------------------------------------------------------- Problem List Details Patient Name: Date of Service: [name]. 07/12/2022 12:30 PM Medical Record Number: [id number]  Patient Account Number: [id number] Date of Birth/Sex: Treating RN: 02-05-1951 (72 y.o. M) Primary Care Provider: Jilda [name] Other Clinician: Referring Provider: Treating Provider/Extender: [name] in Treatment: 49 Active Problems ICD-10 Encounter Code Description Active Date MDM Diagnosis L97.528 Non-pressure chronic ulcer of other part of left foot with other specified 04/12/2021 No Yes severity L97.828 Non-pressure chronic ulcer of other part of left lower leg with other specified 04/12/2021 No Yes severity [name], [name] ([id number]) [id number] Page 9 of 20 E11.621 Type 2 diabetes mellitus with foot ulcer 04/12/2021 No Yes 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 04/12/2021 No Yes I89.0 Lymphedema, not elsewhere classified 04/12/2021 No Yes I87.322 Chronic venous hypertension (idiopathic) with inflammation of left lower 04/12/2021 No Yes extremity Inactive Problems ICD-10 Code Description Active Date Inactive Date L97.828 Non-pressure chronic ulcer of other part of left lower leg with other specified severity 06/08/2022 06/08/2022 E11.42 Type 2 diabetes mellitus with diabetic polyneuropathy 04/12/2021 04/12/2021 L02.416 Cutaneous abscess of left lower limb 06/13/2021 06/13/2021 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 07/20/2021 07/20/2021 Resolved Problems Electronic Signature(s) Signed: 07/12/2022 1:27:47 PM By: [name] Maudlin MD FACS Entered By: [name] on 07/12/2022 13:27:47 -------------------------------------------------------------------------------- Progress Note Details Patient Name: Date of Service: [name]. 07/12/2022 12:30 PM Medical Record Number: [id number] Patient Account Number: [id number] Date of Birth/Sex: Treating RN: May 29, 1951 (72 y.o. M) Primary Care Provider: Jilda [name] Other Clinician: Referring Provider: Treating Provider/Extender: [name] in Treatment: 11 Subjective Chief Complaint Information obtained from Patient Left leg and foot ulcers 04/12/2021; patient is here for wounds on his left lower leg and left plantar foot over the first metatarsal head History of Present Illness (HPI) 10/11/17; Mr. Dunklee is a 72 year old man who tells me that in 2015 he slipped down the latter traumatizing his left leg. He developed a wound in the same spot the area that we are currently looking at. He states this closed over for  the most part although he always felt it was somewhat unstable. In 2016 he hit the same area with the door of his car had this reopened. He tells me that this is never really closed although sometimes an inflow it remains open on a constant basis. He has not been using any specific dressing to this except for topical antibiotics the nature of which were not really sure. His primary doctor did send him to see Dr. Einar [name] of interventional cardiology. He underwent an angiogram on 08/06/17 and he underwent a PTA and directional atherectomy of the lesser distal SFA and popliteal arteries which resulted in brisk improvement in blood flow. It was noted that he had 2 vessel runoff through MYCAL, CONDE ([id number]) [id number] Page 10 of 20 the anterior tibial and peroneal. He is also been to see vascular and interventional radiologist. He was not felt to have any significant superficial venous insufficiency. Presumably is not a candidate for any ablation. It was suggested he come here for wound care. The patient is a type II diabetic on insulin. He also has a history of venous insufficiency. ABIs on the left were noncompressible in our clinic 10/21/17; patient we admitted to the clinic last week. 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 We put Iodosorb on him after an aggressive debridement and 3 layer compression. He complained of pain in his ankle and itching with is skin in  fact he scratched the area on the medial calf superiorly at the rim of our wraps and he has 2 small open areas in that location today which are new. I changed his primary dressing today to silver collagen. As noted he is already had revascularization and does not have any significant superficial venous insufficiency that would be amenable to ablation 10/28/17; patient admitted to the clinic 2 weeks ago. He has a smaller Wound. Scratch injury from  last week revealed. There is large wound over the tibial area. This is smaller. Granulation looks healthy. No need for debridement. 11/04/17; the wound on the left lateral calf looks better. Improved dimensions. Surface of this looks better. We've been maintaining him and Kerlix Coban wraps. He finds this much more comfortable. Silver collagen dressing 11/11/17; left lateral Wound continues to look healthy be making progress. Using a #5 curet I removed removed nonviable skin from the surface of the wound and then necrotic debris from the wound surface. Surface of the wound continues to look healthy. ooHe also has an open area on the left great toenail bed. We've been using topical antibiotics. 11/19/17; left anterior lateral wound continues to look healthy but it's not closed. ooHe also had a small wound above this on the left leg ooInitially traumatic wounds in the setting of significant chronic venous insufficiency and stasis dermatitis 11/25/17; left anterior wounds superiorly is closed still a small wound inferiorly. 12/02/17; left anterior tibial area. Arrives today with adherent callus. Post debridement clearly not completely closed. Hydrofera Blue under 3 layer compression. 12/09/17; left anterior tibia. Circumferential eschar however the wound bed looks stable to improved. We've been using Hydrofera Blue under 3 layer compression 12/17/17; left anterior tibia. Apparently this was felt to be closed however when the wrap was taken off there is a skin tear to reopen wounds in the same area we've been using Hydrofera Blue under 3 layer compression 12/23/17 left anterior tibia. Not close to close this week apparently the [facility] was stuck to this again. Still circumferential eschar requiring debridement. I put a contact layer on this this time under the Hydrofera Blue 12/31/17; left anterior tibia. Wound is better slight amount of hyper-granulation. Using Hydrofera Blue over Adaptic. 01/07/18;  left anterior tibia. The wound had some surface eschar however after this was removed he has no open wound.he was already revascularized by Dr. Einar [name] when he came to our clinic with atherectomy of the left SFA and popliteal artery. He was also sent to interventional radiology for venous reflux studies. He was not felt to have significant reflux but certainly has chronic venous changes of his skin with hemosiderin deposition around this area. He will definitely need to lubricate his skin and wear compression stocking and I've talked to him about this. READMISSION 05/26/2018 This is a now 72 year old man we cared for with traumatic wounds on his left anterior lower extremity. He had been previously revascularized during that admission by Dr. Einar [name]. Apparently in follow-up Dr. Einar [name] noted that he had deterioration in his arterial status. He underwent a stent placement in the distal left SFA on 04/22/2018. 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 He went back to the angiography suite on 04/30/2018 he underwent PTA and balloon angioplasty of the occluded left mid anterior tibial artery, thrombotic occlusion went from 100 to 0% which reconstitutes the posterior tibial artery. He had thrombectomy and aspiration of the peroneal artery. The stent placed in the distal SFA left SFA was still occluded. He was discharged  on Xarelto, it was noted on the discharge summary from this hospitalization that he had gangrene at the tip of his left fifth toe and there were expectations this would auto amputate. 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 He has been recuperating at [facility] in [facility] after the most recent hospitalization. He is going home tomorrow. He tells me that 2 weeks ago he traumatized the tip of his left fifth toe. He came in urgently for our review of this. This was a history of before I noted that Dr. Einar [name] had already  noted dry gangrenous changes of the left fifth toe 06/09/2018; 2-week follow-up. I did contact Dr. Einar [name] after his last appointment and he apparently saw 1 of Dr. Irven Shelling colleagues the next day. He does not follow-up with Dr. Einar [name] himself until Thursday of this week. 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 Nevertheless there is no evidence of infection no drainage and no pain. He had a new area that this week when we were signing him in today on the left anterior mid tibia area, this is in close proximity to the previous wound we have dealt with in this clinic. 06/23/2018; 2-week follow-up. I did not receive a recent note from Dr. Einar [name] to review today. Our office is trying to obtain this. He is apparently not planning to do further vascular interventions and wondered about compression to try and help with the patient's chronic venous insufficiency. However we are also concerned about the arterial flow. ooHe arrives in clinic today with a new area on the left third toe. The areas on the calf/anterior tibia are close to closing. The left fifth toe is still mummified using Betadine. -In reviewing things with the patient he has what sounds like claudication with mild to moderate amount of activity. 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 I prescribed Levaquin over the phone while we attempted to arrange a plan of care. However the patient called yesterday to report he had low-grade fever and he came in today acutely. There is been a marked deterioration in the left third toe with spreading cellulitis up into the dorsal left foot. He was referred to the emergency room. Readmission: 06/29/2020 patient presents today for reevaluation here in our clinic he was previously treated by Dr. Dellia Nims at the latter part of 2019 in 2 the beginning of 2020. Subsequently we have not seen him since that time in the interim he did have evaluation with vein and  vascular specialist specifically Dr. Anice Paganini who did perform quite extensive work for a left femoral to anterior tibial artery bypass. With that being said in the interim the patient has developed significant lymphedema and has wounds that he tells me have really never healed in regard to the incision site on the left leg. He also has multiple wounds on the feet for various reasons some of which is that he tends to pick at his feet. Fortunately there is no signs of active infection systemically at this time he does have some wounds that are little bit deeper but most are fairly superficial he seems to have good blood flow and overall everything appears to be healthy I see no bone exposed and no obvious signs of osteomyelitis. I do not know that he necessarily needs a x-ray at this point although that something we could consider depending on how  things progress. The patient does have a history of lymphedema, diabetes, this is type II, chronic kidney disease stage III, hypertension, and history of peripheral vascular disease. 07/05/2020; patient admitted last week. Is a patient I remember from 2019 he had a spreading infection involving the left foot and we sent him to the hospital. 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 He subsequently had a left femoral to anterior tibial bypass by Dr.Cain vein and vascular. He also has severe lymphedema with chronic skin changes related to that on the left leg. The most problematic area that was new today was on the left medial great toe. This was apparently a small area last week there was purulent drainage which our intake nurse cultured. Also areas on the left medial foot and heel left lateral foot. He has 2 areas on the left medial calf left lateral calf in the setting of the severe lymphedema. 07/13/2020 on evaluation today patient appears to be doing better in my opinion compared to his last visit. The good news is  there is no signs of active infection systemically and locally I do not see any signs of infection either. He did have an x-ray which was negative that is great news he had a culture which showed MRSA but at the same time he is been on the doxycycline which has helped. I do think we may want to extend this for 7 additional days 1/25; patient admitted to the clinic a few weeks ago. 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 We have been putting him under compression his edema control is a lot better but he is severe verricused skin on the left leg. He is really done quite well he still has an open area on the left medial calf and the left medial first metatarsal head. We have been using silver collagen on the leg silver alginate on the foot 07/27/2020 upon evaluation today patient appears to be doing decently well in regard to his wounds. He still has a lot of dry skin on the left leg. Some of this is [name], [name] ([id number]) [id number] Page 11 of 20 starting to peel back and I think he may be able to have them out by removing some that today. Fortunately there is no signs of active infection at this time on the left leg although on the right leg he does appear to have swelling and erythema as well as some mild warmth to touch. This does have been concerned about the possibility of cellulitis although within the differential diagnosis I do think that potentially a DVT has to be at least considered. We need to rule that out before proceeding would just call in the cellulitis. Especially since he is having pain in the posterior aspect of his calf muscle. 2/8; the patient had seen sparingly. He has severe skin changes of chronic lymphedema in the left leg thickened hyperkeratotic verrucous skin. He has an open wound on the medial part of the left first met head left mid tibia. He also has a rim of nonepithelialized skin in the  anterior mid tibia. He brought in the AmLactin lotion that was been prescribed although I am not sure under compression and its utility. There concern about cellulitis on the right lower leg the last time he was here. He was put on on antibiotics. His DVT rule out was negative. The right leg looks fine he is  using his stocking on this area 08/10/2020 upon evaluation today patient appears to be doing well with regard to his leg currently. He has been tolerating the dressing changes without complication. Fortunately there is no signs of active infection which is great news. Overall very pleased with where things stand. 2/22; the patient still has an area on the medial part of the left first met his head. This looks better than when I last saw this earlier this month he has a rim of epithelialization but still some surface debris. Mostly everything on the left leg is healed. There is still a vulnerable in the left mid tibia area. 08/30/2020 upon evaluation today patient appears to be doing much better in regard to his wounds on his foot. 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 Nonetheless I think we will address that today I Minna send in a prescription for him in that regard. Overall though there does not appear to be any signs of significant worsening. 09/07/2020 on evaluation today patient's wounds over his left foot appear to be doing excellent. I do not see any signs of infection there is some callus buildup this can require debridement for certain but overall I feel like he is managing quite nicely. He still using the AmLactin cream which has been beneficial for him as well. 3/22; left foot wound is closed. There is no open area here. He is using ammonium lactate lotion to the lower extremities to help exfoliate dry cracked skin. He has compression stockings from  elastic therapy in [place]. The wound on the medial part of his left first met head is healed today. READMISSION 04/12/2021 Mr. Coca is a patient we know fairly well he had a prolonged stay in clinic in 2019 with wounds on his left lateral and left anterior lower extremity in the setting of chronic venous insufficiency. More recently he was here earlier this year with predominantly an area on his left foot first metatarsal head plantar and he says the plantar foot broke down on its not long after we discharged him but he did not come back here. The last few months areas of broken down on his left anterior and again the left lateral lower extremity. The leg itself is very swollen chronically enlarged a lot of hyperkeratotic dry Berry Q skin in the left lower leg. His edema extends well into the thigh. He was seen by Dr. Donzetta Matters. He had ABIs on 03/02/2021 showing an ABI on the right of 1 with a TBI of 0.72 his ABI in the left at 1.09 TBI of 0.99. Monophasic and biphasic waveforms on the right. On the left monophasic waveforms were noted he went on to have an angiogram on 03/27/2021 this showed the aortic aortic and iliac segments were free of flow-limiting stenosis the left common femoral vein to evaluate the left femoral to anterior tibial artery bypass was unobstructed the bypass was patent without any areas of stenosis. We discharged the patient in bilateral juxta lite stockings but very clearly that was not sufficient to control the swelling and maintain skin integrity. He is clearly going to need compression pumps. 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 This is fortunate because he is on his feet for long periods of time. 10/27; patient comes in with  our intake nurse reporting copious amount of green drainage from the left anterior mid tibia the left dorsal foot and to a lesser extent the left medial mid tibia. We left the compression wrap on  all week for the amount of edema in his left leg is quite a bit better. We use silver alginate as the primary dressing 11/3; edema control is good. Left anterior lower leg left medial lower leg and the plantar first metatarsal head. The left anterior lower leg required debridement. Deep tissue culture I did of this wound showed MRSA I put him on 10 days of doxycycline which she will start today. We have him in compression wraps. He has a security card and [employer] however he is retiring on November 15. We will need to then get him into a better offloading boot for the left foot perhaps a total contact cast 11/10; edema control is quite good. Left anterior and left medial lower leg wounds in the setting of chronic venous insufficiency and lymphedema. He also has a substantial area over the left plantar first metatarsal head. 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 He has significant hypergranulation on the left plantar foot wound. The patient is a diabetic but he does not have significant PAD 11/17; edema control is quite good. Left anterior and left medial lower leg wounds look better. The really concerning area remains the area on the left plantar first metatarsal head. He has a rim of epithelialization. 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 Starting today with a forefoot off loader but . possibly a total contact cast. 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 Arrives with a very significant odor from I think all of the wounds on the left leg and the left foot. Because of this I did not put a total contact cast on him today but will could still consider this. His wife was having  cataract surgery which is the reason he missed the appointment 12/6. I saw this man 5 days ago with a swelling below the popliteal fossa. I thought he actually might have a Baker's cyst however the DVT rule out study that we could arrange right away was negative the technician told me this was not a ruptured Baker's cyst. We attempted to get this aspirated by under ultrasound guidance in interventional radiology however all they did was an ultrasound however it shows an extensive fluid collection 62 x 8 x 9.4 in the left thigh and left calf. The patient states he thinks this started 8 days ago or so but he really is not complaining of any pain, fever or systemic symptoms. He has not ha 12/20; after some difficulty I managed to get the patient into see Dr. Donzetta Matters. Eventually he was taken into the hospital and had a drain put in the fluid collection below his left knee posteriorly extending into the posterior thigh. He still has the drain in place. Culture of this showed moderate staff aureus few Morganella and few Klebsiella he is now on doxycycline and ciprofloxacin as suggested by infectious disease he is on this for a month. The drain will remain in place until  it stops draining 12/29; he comes in today with the 1 wound on his left leg and the area on the left plantar first met head significantly smaller. Both look healthy. He still has the drain in the left leg. He says he has to change this daily. Follows up with Dr. Donzetta Matters on January 11. 06/29/2021; the wounds that I am following on the left leg and left first met head continued to be quite healthy. However the area where his inferior drain is in place had copious amounts of drainage which was green in color. The wound here is larger. Follows up with Dr. Gwenlyn Saran of vein and vascular his surgeon next week as well as infectious disease. He remains on ciprofloxacin and doxycycline. He is not complaining of excessive pain in either one of the drain  areas 1/12; the patient saw vascular surgery and infectious disease. Vascular surgery has left the drain in place as there was still some notable drainage still see him back in 2 weeks. Dr. Velna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

## 2022-07-18 ENCOUNTER — Other Ambulatory Visit: Payer: Self-pay | Admitting: Nurse Practitioner

## 2022-07-18 MED ORDER — OMNIPOD DASH PDM (GEN 4) KIT: PACK | 0 refills | Status: DC

## 2022-07-18 NOTE — Progress Notes (Signed)
Patient lost his PDM yesterday for his [employer]mnip[employer].  I sent in Rx for PDM to his local pharmacy but I also recommend he reach out to [employer] to see is they have any resources.

## 2022-07-19 ENCOUNTER — Encounter (HOSPITAL_BASED_OUTPATIENT_CLINIC_OR_DEPARTMENT_OTHER): Payer: Medicare Other | Admitting: General Surgery

## 2022-07-19 DIAGNOSIS — E11621 Type 2 diabetes mellitus with foot ulcer: Secondary | ICD-10-CM | POA: Diagnosis not present

## 2022-07-19 NOTE — Progress Notes (Signed)
BURNIS, HALLING ([id number]) [id number] Page 1 of 20 Visit Report for 07/19/2022 Chief Complaint Document Details Patient Name: Date of Service: [name] 07/19/2022 9:15 A M Medical Record Number: [id number] Patient Account Number: [id number] Date of Birth/Sex: Treating RN: 07-25-1950 (72 y.o. M) Primary Care Provider: [name] Other Clinician: Referring Provider: Treating Provider/Extender: [name] in Treatment: 66 Information Obtained from: Patient Chief Complaint Left leg and foot ulcers 04/12/2021; patient is here for wounds on his left lower leg and left plantar foot over the first metatarsal head Electronic Signature(s) Signed: 07/19/2022 9:49:13 AM By: Fredirick Maudlin MD FACS Entered By: Fredirick Maudlin on 07/19/2022 09:49:13 -------------------------------------------------------------------------------- Debridement Details Patient Name: Date of Service: [name]. 07/19/2022 9:15 A M Medical Record Number: [id number] Patient Account Number: [id number] Date of Birth/Sex: Treating RN: 06/26/1950 (72 y.o. [name] Primary Care Provider: [name] Other Clinician: Referring Provider: Treating Provider/Extender: [name] in Treatment: 66 Debridement Performed for Assessment: Wound #22 Left,Proximal,Lateral Lower Leg Performed By: Physician Fredirick Maudlin, MD Debridement Type: Debridement Level of Consciousness (Pre-procedure): Awake and Alert Pre-procedure Verification/Time Out Yes - 09:42 Taken: Start Time: 09:42 T Area Debrided (L x W): otal 4.6 (cm) x 1.8 (cm) = 8.28 (cm) Tissue and other material debrided: Non-Viable, Slough, Slough Level: Non-Viable Tissue Debridement Description: Selective/Open Wound Instrument: Curette Bleeding: Minimum Hemostasis Achieved: Pressure Response to Treatment: Procedure was tolerated well Level of Consciousness (Post- Awake  and Alert procedure): Post Debridement Measurements of Total Wound Length: (cm) 4.6 Width: (cm) 1.8 Depth: (cm) 0.1 Volume: (cm) 0.65 Character of Wound/Ulcer Post Debridement: Requires Further Debridement Post Procedure Diagnosis Same as Scharlene Gloss ([id number]) [id number] Page 2 of 20 Notes Scribed for Dr. Celine Ahr by Blanche East, RN Electronic Signature(s) Signed: 07/19/2022 10:02:46 AM By: Fredirick Maudlin MD FACS Signed: 07/19/2022 4:08:23 PM By: Blanche East RN Entered By: Blanche East on 07/19/2022 09:44:10 -------------------------------------------------------------------------------- HPI Details Patient Name: Date of Service: [name]. 07/19/2022 9:15 A M Medical Record Number: [id number] Patient Account Number: [id number] Date of Birth/Sex: Treating RN: September 16, 1950 (72 y.o. M) Primary Care Provider: [name] Other Clinician: Referring Provider: Treating Provider/Extender: [name] in Treatment: 24 History of Present Illness HPI Description: 10/11/17; [name] is a 72 year old man who tells me that in 2015 he slipped down the latter traumatizing his left leg. He developed a wound in the same spot the area that we are currently looking at. He states this closed over for the most part although he always felt it was somewhat unstable. In 2016 he hit the same area with the door of his car had this reopened. He tells me that this is never really closed although sometimes an inflow it remains open on a constant basis. He has not been using any specific dressing to this except for topical antibiotics the nature of which were not really sure. His primary doctor did send him to see Dr. Einar [name] of interventional cardiology. He underwent an angiogram on 08/06/17 and he underwent a PTA and directional atherectomy of the lesser distal SFA and popliteal arteries which resulted in brisk improvement in blood  flow. It was noted that he had 2 vessel runoff through the anterior tibial and peroneal. He is also been to see vascular and interventional radiologist. He was not felt to have any significant superficial venous insufficiency. Presumably is not a candidate for any ablation. It was suggested he come here for wound  care. The patient is a type II diabetic on insulin. He also has a history of venous insufficiency. ABIs on the left were noncompressible in our clinic 10/21/17; patient we admitted to the clinic last week. 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 We put Iodosorb on him after an aggressive debridement and 3 layer compression. He complained of pain in his ankle and itching with is skin in fact he scratched the area on the medial calf superiorly at the rim of our wraps and he has 2 small open areas in that location today which are new. I changed his primary dressing today to silver collagen. As noted he is already had revascularization and does not have any significant superficial venous insufficiency that would be amenable to ablation 10/28/17; patient admitted to the clinic 2 weeks ago. He has a smaller Wound. Scratch injury from last week revealed. There is large wound over the tibial area. This is smaller. Granulation looks healthy. No need for debridement. 11/04/17; the wound on the left lateral calf looks better. Improved dimensions. Surface of this looks better. We've been maintaining him and Kerlix Coban wraps. He finds this much more comfortable. Silver collagen dressing 11/11/17; left lateral Wound continues to look healthy be making progress. Using a #5 curet I removed removed nonviable skin from the surface of the wound and then necrotic debris from the wound surface. Surface of the wound continues to look healthy. He also has an open area on the left great toenail bed. We've been using topical antibiotics. 11/19/17;  left anterior lateral wound continues to look healthy but it's not closed. He also had a small wound above this on the left leg Initially traumatic wounds in the setting of significant chronic venous insufficiency and stasis dermatitis 11/25/17; left anterior wounds superiorly is closed still a small wound inferiorly. 12/02/17; left anterior tibial area. Arrives today with adherent callus. Post debridement clearly not completely closed. Hydrofera Blue under 3 layer compression. 12/09/17; left anterior tibia. Circumferential eschar however the wound bed looks stable to improved. We've been using Hydrofera Blue under 3 layer compression 12/17/17; left anterior tibia. Apparently this was felt to be closed however when the wrap was taken off there is a skin tear to reopen wounds in the same area we've been using Hydrofera Blue under 3 layer compression 12/23/17 left anterior tibia. Not close to close this week apparently the [facility] was stuck to this again. Still circumferential eschar requiring debridement. I put a contact layer on this this time under the Hydrofera Blue 12/31/17; left anterior tibia. Wound is better slight amount of hyper-granulation. Using Hydrofera Blue over Adaptic. 01/07/18; left anterior tibia. The wound had some surface eschar however after this was removed he has no open wound.he was already revascularized by Dr. Einar [name] when he came to our clinic with atherectomy of the left SFA and popliteal artery. He was also sent to interventional radiology for venous reflux studies. He was not felt to have significant reflux but certainly has chronic venous changes of his skin with hemosiderin deposition around this area. He will definitely need to lubricate his skin and wear compression stocking and I've talked to him about this. READMISSION 05/26/2018 This is a now 72 year old man we cared for with traumatic wounds on his left anterior lower extremity. He had been previously revascularized  during that admission by Dr. Einar [name]. Apparently in follow-up Dr. Einar [name] noted that he had deterioration in his  arterial status. He underwent a stent placement in the distal left SFA on 04/22/2018. 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 He went back to the angiography suite on 04/30/2018 he underwent PTA and balloon angioplasty of the occluded left mid anterior tibial artery, thrombotic occlusion went from 100 to 0% which reconstitutes the posterior tibial artery. He had thrombectomy and aspiration of the peroneal artery. The stent placed in the distal SFA left SFA was still occluded. 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 He has been recuperating at [place] [facility] in [facility] after the most recent hospitalization. He is going home tomorrow. He tells me that 2 weeks ago he traumatized the tip of his left fifth toe. He came in urgently for our review of this. This was a history of before I noted that Dr. Einar [name] had already noted dry gangrenous changes of the left fifth toe 06/09/2018; 2-week follow-up. I did contact Dr. Einar [name] after his last appointment and he apparently saw 1 of Dr. Irven Shelling colleagues the next day. He does not JAWAAN, ADACHI ([id number]) [id number] Page 3 of 20 follow-up with Dr. Einar [name] himself until Thursday of this week. 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 Nevertheless there is no evidence of infection no drainage and no pain. He had a new area that this week when we were signing him in today on the left anterior mid tibia area, this is in close proximity to the previous wound we have dealt with in this clinic. 06/23/2018; 2-week follow-up. I did not receive  a recent note from Dr. Einar [name] to review today. Our office is trying to obtain this. He is apparently not planning to do further vascular interventions and wondered about compression to try and help with the patient's chronic venous insufficiency. However we are also concerned about the arterial flow. He arrives in clinic today with a new area on the left third toe. The areas on the calf/anterior tibia are close to closing. The left fifth toe is still mummified using Betadine. -In reviewing things with the patient he has what sounds like claudication with mild to moderate amount of activity. 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 I prescribed Levaquin over the phone while we attempted to arrange a plan of care. However the patient called yesterday to report he had low-grade fever and he came in today acutely. There is been a marked deterioration in the left third toe with spreading cellulitis up into the dorsal left foot. He was referred to the emergency room. Readmission: 06/29/2020 patient presents today for reevaluation here in our clinic he was previously treated by Dr. Dellia Nims at the latter part of 2019 in 2 the beginning of 2020. Subsequently we have not seen him since that time in the interim he did have evaluation with vein and vascular specialist specifically Dr. Anice Paganini who did perform quite extensive work for a left femoral to anterior tibial artery bypass. With that being said in the interim the patient has developed significant lymphedema and has wounds that he tells me have really never healed in regard to the incision site on the left leg. He also has multiple wounds on the feet  for various reasons some of which is that he tends to pick at his feet. Fortunately there is no signs of active infection systemically at this time he does have some wounds that are little bit deeper but most are fairly superficial he seems to have good blood flow and overall  everything appears to be healthy I see no bone exposed and no obvious signs of osteomyelitis. I do not know that he necessarily needs a x-ray at this point although that something we could consider depending on how things progress. The patient does have a history of lymphedema, diabetes, this is type II, chronic kidney disease stage III, hypertension, and history of peripheral vascular disease. 07/05/2020; patient admitted last week. Is a patient I remember from 2019 he had a spreading infection involving the left foot and we sent him to the hospital. 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 He subsequently had a left femoral to anterior tibial bypass by Dr.Cain vein and vascular. He also has severe lymphedema with chronic skin changes related to that on the left leg. The most problematic area that was new today was on the left medial great toe. This was apparently a small area last week there was purulent drainage which our intake nurse cultured. Also areas on the left medial foot and heel left lateral foot. He has 2 areas on the left medial calf left lateral calf in the setting of the severe lymphedema. 07/13/2020 on evaluation today patient appears to be doing better in my opinion compared to his last visit. The good news is there is no signs of active infection systemically and locally I do not see any signs of infection either. He did have an x-ray which was negative that is great news he had a culture which showed MRSA but at the same time he is been on the doxycycline which has helped. I do think we may want to extend this for 7 additional days 1/25; patient admitted to the clinic a few weeks ago. 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 We have been putting him under compression his edema control is a lot better but he is severe verricused skin on the left leg. He is really done quite well he still has an  open area on the left medial calf and the left medial first metatarsal head. We have been using silver collagen on the leg silver alginate on the foot 07/27/2020 upon evaluation today patient appears to be doing decently well in regard to his wounds. He still has a lot of dry skin on the left leg. Some of this is starting to peel back and I think he may be able to have them out by removing some that today. Fortunately there is no signs of active infection at this time on the left leg although on the right leg he does appear to have swelling and erythema as well as some mild warmth to touch. This does have been concerned about the possibility of cellulitis although within the differential diagnosis I do think that potentially a DVT has to be at least considered. We need to rule that out before proceeding would just call in the cellulitis. Especially since he is having pain in the posterior aspect of his calf muscle. 2/8; the patient had seen sparingly. He has severe skin changes of chronic lymphedema in the left leg thickened hyperkeratotic verrucous skin. He has an open wound  on the medial part of the left first met head left mid tibia. He also has a rim of nonepithelialized skin in the anterior mid tibia. He brought in the AmLactin lotion that was been prescribed although I am not sure under compression and its utility. There concern about cellulitis on the right lower leg the last time he was here. He was put on on antibiotics. His DVT rule out was negative. The right leg looks fine he is using his stocking on this area 08/10/2020 upon evaluation today patient appears to be doing well with regard to his leg currently. He has been tolerating the dressing changes without complication. Fortunately there is no signs of active infection which is great news. Overall very pleased with where things stand. 2/22; the patient still has an area on the medial part of the left first met his head. This looks better  than when I last saw this earlier this month he has a rim of epithelialization but still some surface debris. Mostly everything on the left leg is healed. There is still a vulnerable in the left mid tibia area. 08/30/2020 upon evaluation today patient appears to be doing much better in regard to his wounds on his foot. 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 Nonetheless I think we will address that today I Minna send in a prescription for him in that regard. Overall though there does not appear to be any signs of significant worsening. 09/07/2020 on evaluation today patient's wounds over his left foot appear to be doing excellent. I do not see any signs of infection there is some callus buildup this can require debridement for certain but overall I feel like he is managing quite nicely. He still using the AmLactin cream which has been beneficial for him as well. 3/22; left foot wound is closed. There is no open area here. He is using ammonium lactate lotion to the lower extremities to help exfoliate dry cracked skin. He has compression stockings from elastic therapy in [place]. The wound on the medial part of his left first met head is healed today. READMISSION 04/12/2021 Mr. Seidner is a patient we know fairly well he had a prolonged stay in clinic in 2019 with wounds on his left lateral and left anterior lower extremity in the setting of chronic venous insufficiency. More recently he was here earlier this year with predominantly an area on his left foot first metatarsal head plantar and he says the plantar foot broke down on its not long after we discharged him but he did not come back here. The last few months areas of broken down on his left anterior and again the left lateral lower extremity. The leg itself is very swollen chronically enlarged a lot of  hyperkeratotic dry Berry Q skin in the left lower leg. His edema extends well into the thigh. He was seen by Dr. Donzetta Matters. He had ABIs on 03/02/2021 showing an ABI on the right of 1 with a TBI of 0.72 his ABI in the left at 1.09 TBI of 0.99. Monophasic and biphasic waveforms on the right. On the left monophasic waveforms were noted he went on to have an angiogram on 03/27/2021 this showed the aortic aortic and iliac segments were free of flow-limiting stenosis the left common femoral vein to evaluate the left femoral to anterior tibial artery bypass was unobstructed the  bypass was patent without any areas of stenosis. We discharged the patient in bilateral juxta lite stockings but very clearly that was not sufficient to control the swelling and maintain skin integrity. He is clearly going to need compression pumps. 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 This is fortunate because he is on his feet for long periods of time. 10/27; patient comes in with our intake nurse reporting copious amount of green drainage from the left anterior mid tibia the left dorsal foot and to a lesser extent the left medial mid tibia. We left the compression wrap on all week for the amount of edema in his left leg is quite a bit better. We use silver alginate as the primary dressing 11/3; edema control is good. Left anterior lower leg left medial lower leg and the plantar first metatarsal head. The left anterior lower leg required debridement. Deep tissue culture I did of this wound showed MRSA I put him on 10 days of doxycycline which she will start today. We have him in compression wraps. He has a security card and [employer] however he is retiring on November 15. We will need to then get him into a better offloading boot for the left foot perhaps a total CHEICK, SUHR ([id number]) [id number] Page 4 of 20 contact cast 11/10; edema control is quite  good. Left anterior and left medial lower leg wounds in the setting of chronic venous insufficiency and lymphedema. He also has a substantial area over the left plantar first metatarsal head. 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 He has significant hypergranulation on the left plantar foot wound. The patient is a diabetic but he does not have significant PAD 11/17; edema control is quite good. Left anterior and left medial lower leg wounds look better. The really concerning area remains the area on the left plantar first metatarsal head. He has a rim of epithelialization. 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 Starting today with a forefoot off loader but . possibly a total contact cast. 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 Arrives with a very significant odor from I think all of the wounds on the left leg and the left foot. Because of this I did not put a total contact cast on him today but will could still consider this. His wife was having cataract surgery which is the reason he missed the appointment 12/6. I saw this man 5 days ago with a swelling below the popliteal fossa. I thought he actually might have a Baker's cyst however the DVT rule out study that we could arrange right away was negative the technician told me this was not a ruptured Baker's cyst. We attempted to get this aspirated by under ultrasound guidance in interventional radiology however all they did was an ultrasound however it shows an extensive fluid collection 62 x 8 x 9.4 in the left thigh and left calf. The patient states he thinks this started 8 days ago or so but he really is not complaining of any  pain, fever or systemic  symptoms. He has not ha 12/20; after some difficulty I managed to get the patient into see Dr. Donzetta Matters. Eventually he was taken into the hospital and had a drain put in the fluid collection below his left knee posteriorly extending into the posterior thigh. He still has the drain in place. Culture of this showed moderate staff aureus few Morganella and few Klebsiella he is now on doxycycline and ciprofloxacin as suggested by infectious disease he is on this for a month. The drain will remain in place until it stops draining 12/29; he comes in today with the 1 wound on his left leg and the area on the left plantar first met head significantly smaller. Both look healthy. He still has the drain in the left leg. He says he has to change this daily. Follows up with Dr. Donzetta Matters on January 11. 06/29/2021; the wounds that I am following on the left leg and left first met head continued to be quite healthy. However the area where his inferior drain is in place had copious amounts of drainage which was green in color. The wound here is larger. Follows up with Dr. Gwenlyn Saran of vein and vascular his surgeon next week as well as infectious disease. He remains on ciprofloxacin and doxycycline. He is not complaining of excessive pain in either one of the drain areas 1/12; the patient saw vascular surgery and infectious disease. Vascular surgery has left the drain in place as there was still some notable drainage still see him back in 2 weeks. Dr. Velna Ochs stop the doxycycline and ciprofloxacin and I do not believe he follows up with them at this point. Culture I did last week showed both doxycycline resistant MRSA and Pseudomonas not sensitive to ciprofloxacin although only in rare titers 1/19; the patient's wound on the left anterior lower leg is just about healed. We have continued healing of the area that was medially on the left leg. Left first plantar metatarsal head continues to get smaller. The major  problem here is his 2 drain sites 1 on the left upper calf and lateral thigh. There is purulent drainage still from the left lateral thigh. I gave him antibiotics last week but we still have recultured. He has the drain in the area I think this is eventually going to have to come out. I suspect there will be a connecting wound to heal here perhaps with improved VAc 1/26; the patient had his drain removed by vein and vascular on 1/25/. This was a large pocket of fluid in his left thigh that seem to tunnel into his left upper calf. 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 His mention his Penrose drain was removed today. He now has a tunneling wound on his left calf and left thigh. Both of these probe widely towards each other although I cannot really prove that they connect. Both wounds on his lower leg anteriorly are closed and his area over the first metatarsal head on his right foot continues to improve. We are using Hydrofera Blue here. He also saw infectious disease culture of the abscess they noted was polymicrobial with MRSA, Morganella and Klebsiella he was treated with doxycycline and ciprofloxacin for 4 weeks ending on 07/03/2021. They did not recommend any further antibiotics. Notable that while he still had the Penrose drain in place last week he had purulent drainage coming out of the inferior IandD site this grew [place] [facility], MRSA and Pseudomonas but there does not appear to be any  active infection in this area today with the drain out and he is not systemically unwell 2/2; with regards to the drain sites the superior one on the thigh actually is closed down the one on the upper left lateral calf measures about 8 and half centimeters which is an improvement seems to be less prominent although still with a lot of drainage. The only remaining wound is over the first metatarsal head on the left foot and this looks to be continuing to improve with Hydrofera Blue. 2/9; the  area on his plantar left foot continues to contract. Callus around the wound edge. The drain sites specifically have not come down in depth. We put the wound VAC on Monday he changed the canister late last night our intake nurse reported a pocket of fluid perhaps caused by our compression wraps 2/16; continued improvement in left foot plantar wound. drainage site in the calf is not improved in terms of depth (wound vac) 2/23; continued improvement in the left foot wound over the first metatarsal head. With regards to the drain sites the area on his thigh laterally is healed however the open area on his calf is small in terms of circumference by still probes in by about 15 cm. Within using the wound VAC. Hydrofera Blue on his foot 08/24/2021: The left first metatarsal head wound continues to improve. The wound bed is healthy with just some surrounding callus. Unfortunately the open drain site on his calf remains open and tunnels at least 15 cm (the extent of a Q-tip). This is despite several weeks of wound VAC treatment. Based on reading back through the notes, there has been really no significant change in the depth of the wound, although the orifice is smaller and the more cranial wound on his thigh has closed. I suspect the tunnel tracks nearly all the way to this location. 08/31/2021: Continued improvement in the left first metatarsal head wound. There has been absolutely no improvement to the long tunnel from his open drain site on his calf. We have tried to get him into see vascular surgery sooner to consider the possibility of simply filleting the tract open and allowing it to heal from the bottom up, likely with a wound VAC. They have not yet scheduled a sooner appointment than his current mid April 09/14/2021: He was seen by vascular surgery and they took him to the operating room last week. They opened a portion of the tunnel, but did not extend the entire length of the known open subcutaneous  tract. I read Dr. Claretha Cooper operative note and it is not clear from that documentation why only a portion of the tract was opened. The heaped up granulation tissue was curetted and removed from at least some portion of the tract. They did place a wound VAC and applied an Unna boot to the leg. The ulcer on his left first metatarsal head is smaller today. The bed looks good and there is just a small amount of surrounding callus. 09/21/2021: The ulcer on his left first metatarsal head looks to be stalled. There is some callus surrounding the wound but the wound bed itself does not appear particularly dynamic. The tunnel tract on his lateral left leg seems to be roughly the same length or perhaps slightly smaller but the wound bed appears healthy with good granulation tissue. He opened up a new wound on his medial thigh and the site of a prior surgical incision. He says that he did this unconsciously in his sleep  by scratching. 09/28/2021: Unfortunately, the ulcer on his left first metatarsal head has extended underneath the callus toward the dorsum of his foot. The medial thigh wounds are roughly the same. The tunnel on his lateral left leg continues to be problematic; it is longer than we are able to actually probe with a Q-tip. I am still not certain as to why Dr. Donzetta Matters did not open this up entirely when he took the patient to the operating room. We will likely be back in the same situation with just a small superficial opening in a long unhealed tract, as the open portion is granulating in nicely. 10/02/2021: The patient was initially scheduled for a nurse visit, but we are also applying a total contact cast today. The plantar foot wound looks clean without ASHUR, GLATFELTER ([id number]) [id number] Page 5 of 20 significant accumulated callus. We have been applying Prisma silver collagen to the site. 10/05/2021: The patient is here for his first total contact cast change. We have tried  using gauze packing strips in the tunnel on his lateral leg wound, but this does not seem to be working any better than the white VAC foam. The foot ulcer looks about the same with minimal periwound callus. Medial thigh wound is clean with just some overlying eschar. 10/12/2021: The plantar foot wound is stable without any significant accumulation of periwound callus. The surface is viable with good granulation tissue. The medial thigh wounds are much smaller and are epithelializing. On the other hand, he had purulent drainage coming from the tunnel on his lateral leg. He does go back to see Dr. Donzetta Matters next week and is planning to ask him why the wound tunnel was not completely opened at the time of his most recent operation. 10/19/2021: The plantar foot wound is markedly improved and has epithelial tissue coming through the surface. The medial thigh wounds are nearly closed with just a tiny open area. He did see Dr. Donzetta Matters earlier this week and apparently they did discuss the possibility of opening the sinus tract further and enabling a wound VAC application. Apparently there are some limits as to what Dr. 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 I think if we could get the tract open to the level of the popliteal fossa, this would greatly aid in her ability to get this chart closed. That being said, however, today when I probed the tract with a Q-tip, I was not able to insert the entirety of the Q-tip as I have on previous occasions. The tunnel is shorter by about 4 cm. The surface is clean with good granulation tissue and no further episodes of purulent drainage. 10/30/2021: Last week, the patient underwent surgery and had the long tract in his leg opened. There was a rind that was debrided, according to the operative report. His medial thigh ulcers are closed. The plantar foot wound is clean with a good surface and some built up surrounding callus. 11/06/2021: The  overall dimensions of the large wound on his lateral leg remain about the same, but there is good granulation tissue present and the tunneling is a little bit shorter. He has a new wound on his anterior tibial surface, in the same location where he had a similar lesion in the past. The plantar foot wound is clean with some buildup surrounding callus. Just toward the medial aspect of his foot, however, there is an area of darkening that once debrided, revealed another opening in the skin surface. 11/13/2021:  The anterior tibial surface wound is closed. The plantar foot wound has some surrounding callus buildup. The area of darkening that I debrided last week and revealed an opening in the skin surface has closed again. The tunnel in the large wound on his lateral leg has come in by about 3 cm. There is healthy granulation tissue on the entire wound surface. 11/23/2021: The patient was out of town last week and did wet-to-dry dressings on his large wound. 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 Unfortunately, he picked open the wound on his left medial thigh. He says that it was itching and he just could not stop scratching it until it was open again. The wound on his plantar foot is smaller and has not accumulated a tremendous amount of callus. The lateral leg wound is shallower and the tunnel has also decreased in depth. There is just a little bit of slough accumulation on the surface. 11/30/2021: Another portion of his left medial thigh has been opened up. All of these wounds are fairly superficial with just a little bit of slough and eschar accumulation. The wound on his plantar foot is almost closed with just a bit of eschar and periwound callus accumulation. The lateral leg wound is nearly flush with the surrounding skin and the tunnel is markedly shallower. 12/07/2021: There is just 1 open area on his left medial thigh. It is clean  with just a little bit of perimeter eschar. The wound on his plantar foot continues to contract and just has some eschar and periwound callus accumulation. The lateral leg wound is closing at the more distal aspect and the tunnel is smaller. The surface is nearly flush with the surrounding skin and it has a good bed of granulation tissue. 12/14/2021: The thigh and foot wounds are closed. The lateral leg wound has closed over approximately half of its length. The tunnel continues to contract and the surface is now flush with the surrounding skin. The wound bed has robust granulation tissue. 12/22/2021: The thigh and foot wounds have reopened. The foot wound has a lot of callus accumulation around and over it. The thigh wound is tiny with just a little bit of slough in the wound bed. The lateral leg wound continues to contract. His vascular surgeon took the wound VAC off earlier in the week and the patient has been doing wet-to-dry dressings. There is a little slough accumulation on the surface. The tunnel is about 3 cm in depth at this point. 12/28/2021: The thigh wound is closed again. The foot wound has some callus that subsequently has peeled back exposing just a small slit of a wound. The lateral leg wound Is down to about half the size that it originally was and the tunnel is down to about half a centimeter in depth. 01/04/2022: The thigh wound remains closed. The foot wound has heavy callus overlying the wound site. Once this was debrided, the wound was found to be closed. The lateral leg wound is smaller again this week and very superficial. No tunnel could be identified. 01/12/2022: The thigh and foot wounds both remain closed. The lateral leg wound is now nearly flush with the skin surface. There is good granulation tissue present with a light layer of slough. 01/19/2022: Due to the way his wrap was placed, the patient did not change the dressing on his thigh at all and so the foam was saturated and  his skin  is macerated. There is a light layer of slough on the wound surface. The underlying granulation tissue is robust and healthy-appearing. He has heavy callus buildup at the site of his first metatarsal head wound which is still healed. 02/01/2022: He has been in silver alginate. When he removed the dressing from his thigh wound, however, some leg, superficially reopening a portion of the wound that had healed. In addition, underneath the callus at his left first metatarsal head, there appears to be a blister and the wound appears to be open again. 02/08/2022: The lateral leg wound has contracted substantially. There is eschar and a light layer of slough present. He says that it is starting to pull and is uncomfortable. On inspection, there is some puckering of the scar and the eschar is quite dry; this may account for his symptoms. On his first metatarsal head, the wound is much smaller with just some eschar on the surface. The callus has not reaccumulated. He reports that he had a blister come up on his medial thigh wound at the distal aspect. It popped and there is now an opening in his skin again. Looking back through his [place] of wound photos, there is what looks like a permanent suture just deep to this location and it may be trying to erode through. We have been using silver alginate on his wounds. 02/15/2022: The lateral leg wound is about half the size it was last week. It is clean with just a little perimeter eschar and light slough. The wound on his first metatarsal head is about the same with heavy callus overlying it. The medial thigh wound is closed again. He does have some skin changes on the top of his foot that looks potentially yeast related. 02/22/2022: The skin on the top of his foot improved with the use of a topical antifungal. The lateral leg wound continues to contract and is again smaller this week. There is a little bit of slough and eschar on the surface. The first  metatarsal head wound is a little bit smaller but has reaccumulated a thick callus over the top. He decided to try to trim his toenail and ultimately took the entire nail off of his left great toe. 03/02/2022: His lateral leg wound continues to improve, as does the wound on his left great toe. Unfortunately, it appears that somehow his foot got wet and moisture seeped in through the opening causing his skin to lift. There is a large wound now overlying his first metatarsal on both the plantar, medial, and dorsal portion of his foot. There is necrotic tissue and slough present underneath the shaggy macerated skin. 03/08/2022: The lateral leg wound is smaller again today. There is just a light layer of slough and eschar on the surface. The great toe wound is smaller again today. The first metatarsal wound is a little bit smaller today and does not look nearly as necrotic and macerated. There is still slough and nonviable tissue present. 03/15/2022: The lateral leg wound is narrower and just has a little bit of light slough buildup. The first metatarsal wound still has a fair amount of moisture affecting the periwound skin. The great toe wound is healed. 03/22/2022: The lateral leg wound is now isolated to just at the level of his knee. There is some eschar and slough accumulation. The first metatarsal head wound has epithelialized tremendously and is about half the size that it was last week. He still has some maceration on the top of his foot  and a fungal odor is KADIEN, LINEMAN ([id number]) [id number] Page 6 of 20 present. 03/29/2022: T oday the patient's foot was macerated, suggesting that the cast got wet. The patient has also been picking at his dry skin and has enlarged the wound on his left lateral leg. In the time between having his cast removed and my evaluation, he had picked more dry skin and opened up additional wounds on his Achilles area and dorsal foot. The plantar  first metatarsal head wound, however, is smaller and clean with just macerated callus around the perimeter and light slough on the surface. The lateral leg wound measured a little bit larger but is also fairly clean with eschar and minimal slough. 04/02/2022: The patient had vascular studies done last Friday and so his cast was not applied. He is here today to have that done. 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 04/05/2022: Both wounds are smaller and quite clean. There is just a little biofilm on the lateral leg wound. 10/20; the patient has a wound on the left lateral surgical incision at the level of his lateral knee this looks clean and improved. He is using silver alginate. He also has an area on his left medial foot for which she is using Hydrofera Blue under a total contact cast both wounds are measuring smaller 04/20/2022: The plantar foot wound has contracted considerably and is very close to closing. The lateral leg wound was measured a little larger, but there was a tiny open area that was included in the measurements that was not included last week. He has some eschar around the perimeter but otherwise the wound looks clean. 04/27/2022: The lateral leg wound looks better this week. He says that midweek, he felt it was very dry and began applying hydrogel to the site. I think this was beneficial. The foot wound is nearly closed underneath a thick layer of dry skin and callus. 05/04/2022: The foot wound is healed. He has developed a new small ulcer on his anterior tibial surface about midway up his leg. It has a little slough on the surface. The lateral leg wound still is fairly dry, but clean with just a little biofilm on the surface. 05/11/2022: The wound on his foot reopened on Wednesday. A large blister formed which then broke open revealing the fat layer underneath. The ulcer on his anterior tibial surface is a little bit larger this week. The lateral leg wound has much  better moisture balance this week. Fortunately, prior to his foot wound reopening, he did get the cast made for his orthotic. 05/15/2022: Already, the left medial foot wound has improved. The tissue is less macerated and the surface is clean. The ulcer on his anterior tibial surface continues to enlarge. This seems likely secondary to accumulated moisture. The lateral leg wound continues to have an improved moisture balance with the use of collagen. 05/25/2022: The medial foot wound continues to contract. It is now substantially smaller with just a little slough on the surface. The anterior tibial surface wound continues to enlarge further. Once again, this seems to be secondary to moisture. The lateral leg wound does not seem to be changing much in size, but the moisture balance is better. 06/01/2022: The anterior tibial wound is closed. The medial foot wound is down to just a very small, couple of millimeters, opening. The lateral leg wound has good moisture balance, but remains unchanged in size. 12/15; the patient's anterior tibial wound has reopened, however the area  on his right first metatarsal head is closed. The major wound is actually on the superior part of his surgical wound in the left lateral thigh. Not a completely viable surface under illumination. This may at some point require a debridement I think he is currently using Prisma. As noted the left medial foot wound has closed 06/14/2022: The anterior tibial wound has closed. The lateral leg wound has a better surface but is basically unchanged in size. The left medial foot wound has reopened. It looks as though there was some callus accumulation and moisture got under the callus which caused the tissue to break down again. 06/21/2022: A new wound has opened up just distal to the previous anterior tibial wound. It is small but has hypertrophic granulation tissue present. The lateral leg wound is a little bit narrower and has a layer of  slough on the surface. The left medial foot wound is down to just a pinhole. His custom orthotics should be available next week. 06/28/2022: The wound on his first metatarsal head has healed. He has developed a new small wound on his medial lower leg, in an old scar site. The lateral leg wound continues to contract but continues to accumulate slough, as well. 07/03/2022: Despite wearing his custom orthopedic shoes, he managed to reopen the wound on his first metatarsal head. He says he thinks his foot got wet and then some skin lifted up and he peeled this away. Both of the lower leg wounds are smaller and have some dry eschar on the surface. The lateral leg wound is quite a bit narrower today. 07/12/2022: The medial lower leg wound is closed. The anterior lower leg wound has contracted considerably. The lateral upper leg wound is narrower with a layer of slough on the surface. The first metatarsal head wound is also smaller, but had copious drainage which saturated the foam border dressing and resulted in some periwound tissue maceration. Fortunately there was no breakdown at this site. 07/19/2022: The lower leg shows signs of significant maceration; I think he must be sweating excessively inside his cast. There are several areas of skin breakdown present. The wound on his foot is smaller and that on his lateral leg is narrower and is shorter by about a centimeter. Electronic Signature(s) Signed: 07/19/2022 9:56:32 AM By: Fredirick Maudlin MD FACS Previous Signature: 07/19/2022 9:51:42 AM Version By: Fredirick Maudlin MD FACS Entered By: Fredirick Maudlin on 07/19/2022 09:56:32 -------------------------------------------------------------------------------- Physical Exam Details Patient Name: Date of Service: [name]. 07/19/2022 9:15 A M Medical Record Number: [id number] Patient Account Number: [id number] Date of Birth/Sex: Treating RN: 09/05/1950 (72 y.o. M) Primary Care Provider: [name]  Other Clinician: Referring Provider: Treating Provider/Extender: [name] in Treatment: [address], [place] ([id number]) [id number] Page 7 of 20 Constitutional Slightly hypertensive. . . . no acute distress. Respiratory Normal work of breathing on room air. Notes 07/19/2022: The lower leg shows signs of significant maceration; I think he must be sweating excessively inside his cast. There are several areas of skin breakdown present. The wound on his foot is smaller and that on his lateral leg is narrower and is shorter by about a centimeter. Electronic Signature(s) Signed: 07/19/2022 9:57:11 AM By: Fredirick Maudlin MD FACS Entered By: Fredirick Maudlin on 07/19/2022 09:57:11 -------------------------------------------------------------------------------- Physician Orders Details Patient Name: Date of Service: [name]. 07/19/2022 9:15 A M Medical Record Number: [id number] Patient Account Number: [id number] Date of Birth/Sex: Treating RN: [birth year] (72 y.o. M) Willey Blade, Roselyn Reef  Primary Care Provider: [name] Other Clinician: Referring Provider: Treating Provider/Extender: [name] in Treatment: 80 Verbal / Phone Orders: No Diagnosis Coding ICD-10 Coding Code Description 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.828 Non-pressure chronic ulcer of other part of left lower leg with other specified severity E11.621 Type 2 diabetes mellitus with foot ulcer E11.51 Type 2 diabetes mellitus with diabetic peripheral angiopathy without gangrene I89.0 Lymphedema, not elsewhere classified I87.322 Chronic venous hypertension (idiopathic) with inflammation of left lower extremity Follow-up Appointments ppointment in 1 week. - Dr Celine Ahr - Room 1 Return A Anesthetic Wound #22 Left,Proximal,Lateral Lower Leg (In clinic) Topical Lidocaine 4% applied to wound  bed Bathing/ Shower/ Hygiene May shower with protection but do not get wound dressing(s) wet. Protect dressing(s) with water repellant cover (for example, large plastic bag) or a cast cover and may then take shower. Edema Control - Lymphedema / SCD / Other Avoid standing for long periods of time. Patient to wear own compression stockings every day. - on right leg; Moisturize legs daily. Compression stocking or Garment 20-30 mm/Hg pressure to: - left leg daily Off-Loading Total Contact Cast to Left Lower Extremity Other: - minimal weight bearing left foot Wound Treatment Wound #18R - Metatarsal head first Wound Laterality: Plantar, Left Cleanser: Soap and Water 1 x Per Week/30 Days Discharge Instructions: May shower and wash wound with dial antibacterial soap and water prior to dressing change. Cleanser: Wound Cleanser 1 x Per Week/30 Days Discharge Instructions: Cleanse the wound with wound cleanser prior to applying a clean dressing using gauze sponges, not tissue or cotton balls. Prim Dressing: Sorbalgon AG Dressing 2x2 (in/in) ary 1 x Per Week/30 Days DESEAN, HEEMSTRA ([id number]) [id number] Page 8 of 20 Discharge Instructions: Apply to wound bed as instructed Secondary Dressing: Zetuvit Plus Silicone Border Dressing 4x4 (in/in) 1 x Per Week/30 Days Discharge Instructions: Apply silicone border over primary dressing as directed. Wound #22 - Lower Leg Wound Laterality: Left, Lateral, Proximal Cleanser: Soap and Water 3 x Per Week/30 Days Discharge Instructions: May shower and wash wound with dial antibacterial soap and water prior to dressing change. Cleanser: Wound Cleanser 3 x Per Week/30 Days Discharge Instructions: Cleanse the wound with wound cleanser prior to applying a clean dressing using gauze sponges, not tissue or cotton balls. Peri-Wound Care: Sween Lotion (Moisturizing lotion) 3 x Per Week/30 Days Discharge Instructions: Apply moisturizing lotion  to the leg Topical: Skintegrity Hydrogel 4 (oz) 3 x Per Week/30 Days Discharge Instructions: Apply hydrogel as directed Prim Dressing: Endoform 2x2 in 3 x Per Week/30 Days ary Discharge Instructions: Moisten with Hydrogel or saline Secondary Dressing: ABD Pad, 8x10 (Generic) 3 x Per Week/30 Days Discharge Instructions: Apply over primary dressing as directed. Secured With: Elastic Bandage 4 inch (ACE bandage) 3 x Per Week/30 Days Discharge Instructions: Secure with ACE bandage as directed. Wound #30 - Lower Leg Wound Laterality: Left, Anterior Cleanser: Soap and Water 1 x Per Week/30 Days Discharge Instructions: May shower and wash wound with dial antibacterial soap and water prior to dressing change. Cleanser: Wound Cleanser 1 x Per Week/30 Days Discharge Instructions: Cleanse the wound with wound cleanser prior to applying a clean dressing using gauze sponges, not tissue or cotton balls. Prim Dressing: Sorbalgon AG Dressing 2x2 (in/in) 1 x Per Week/30 Days ary Discharge Instructions: Apply to wound bed as instructed Secondary Dressing: Zetuvit Plus Silicone Border Dressing 4x4 (in/in) 1 x Per Week/30 Days Discharge Instructions: Apply  silicone border over primary dressing as directed. Compression Wrap: CoFlex TLC Zinc 1 x Per Week/30 Days Electronic Signature(s) Signed: 07/19/2022 10:02:46 AM By: Fredirick Maudlin MD FACS Entered By: Fredirick Maudlin on 07/19/2022 09:57:39 -------------------------------------------------------------------------------- Problem List Details Patient Name: Date of Service: [name]. 07/19/2022 9:15 A M Medical Record Number: [id number] Patient Account Number: [id number] Date of Birth/Sex: Treating RN: Aug 06, 1950 (72 y.o. M) Primary Care Provider: [name] Other Clinician: Referring Provider: Treating Provider/Extender: [name] in Treatment: 66 Active Problems ICD-10 Encounter Code Description Active Date  MDM Diagnosis L97.528 Non-pressure chronic ulcer of other part of left foot with other specified 04/12/2021 No Yes severity DANI, DANIS ([id number]) [id number] Page 9 of 20 [phone] Non-pressure chronic ulcer of other part of left lower leg with other specified 04/12/2021 No Yes severity E11.621 Type 2 diabetes mellitus with foot ulcer 04/12/2021 No Yes E11.51 Type 2 diabetes mellitus with diabetic peripheral angiopathy without gangrene 04/12/2021 No Yes I89.0 Lymphedema, not elsewhere classified 04/12/2021 No Yes I87.322 Chronic venous hypertension (idiopathic) with inflammation of left lower 04/12/2021 No Yes extremity Inactive Problems ICD-10 Code Description Active Date Inactive Date L97.828 Non-pressure chronic ulcer of other part of left lower leg with other specified severity 06/08/2022 06/08/2022 E11.42 Type 2 diabetes mellitus with diabetic polyneuropathy 04/12/2021 04/12/2021 L02.416 Cutaneous abscess of left lower limb 06/13/2021 06/13/2021 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 07/20/2021 07/20/2021 Resolved Problems Electronic Signature(s) Signed: 07/19/2022 9:47:48 AM By: Fredirick Maudlin MD FACS Entered By: Fredirick Maudlin on 07/19/2022 09:47:47 -------------------------------------------------------------------------------- Progress Note Details Patient Name: Date of Service: [name]. 07/19/2022 9:15 A M Medical Record Number: [id number] Patient Account Number: [id number] Date of Birth/Sex: Treating RN: 1950/12/01 (72 y.o. M) Primary Care Provider: [name] Other Clinician: Referring Provider: Treating Provider/Extender: [name] in Treatment: 66 Subjective Chief Complaint Information obtained from Patient Left leg and foot ulcers 04/12/2021; patient is here for wounds on his left lower leg and left plantar foot over the first metatarsal head History of  Present Illness (HPI) 10/11/17; Mr. Sheaffer is a 72 year old man who tells me that in 2015 he slipped down the latter traumatizing his left leg. He developed a wound in the same spot the area that we are currently looking at. He states this closed over for the most part although he always felt it was somewhat unstable. In 2016 he hit the same area with the door of his car had this reopened. He tells me that this is never really closed although sometimes an inflow it remains open on a constant basis. He has not been using any specific dressing to this except for topical antibiotics the nature of which were not really sure. CLAIR, ALFIERI ([id number]) [id number] Page 10 of 20 His primary doctor did send him to see Dr. Einar [name] of interventional cardiology. He underwent an angiogram on 08/06/17 and he underwent a PTA and directional atherectomy of the lesser distal SFA and popliteal arteries which resulted in brisk improvement in blood flow. It was noted that he had 2 vessel runoff through the anterior tibial and peroneal. He is also been to see vascular and interventional radiologist. He was not felt to have any significant superficial venous insufficiency. Presumably is not a candidate for any ablation. It was suggested he come here for wound care. The patient is a type II diabetic on insulin. He also has a history of venous insufficiency. ABIs on the left were noncompressible  in our clinic 10/21/17; patient we admitted to the clinic last week. 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 We put Iodosorb on him after an aggressive debridement and 3 layer compression. He complained of pain in his ankle and itching with is skin in fact he scratched the area on the medial calf superiorly at the rim of our wraps and he has 2 small open areas in that location today which are new. I changed his primary dressing today to  silver collagen. As noted he is already had revascularization and does not have any significant superficial venous insufficiency that would be amenable to ablation 10/28/17; patient admitted to the clinic 2 weeks ago. He has a smaller Wound. Scratch injury from last week revealed. There is large wound over the tibial area. This is smaller. Granulation looks healthy. No need for debridement. 11/04/17; the wound on the left lateral calf looks better. Improved dimensions. Surface of this looks better. We've been maintaining him and Kerlix Coban wraps. He finds this much more comfortable. Silver collagen dressing 11/11/17; left lateral Wound continues to look healthy be making progress. Using a #5 curet I removed removed nonviable skin from the surface of the wound and then necrotic debris from the wound surface. Surface of the wound continues to look healthy. ooHe also has an open area on the left great toenail bed. We've been using topical antibiotics. 11/19/17; left anterior lateral wound continues to look healthy but it's not closed. ooHe also had a small wound above this on the left leg ooInitially traumatic wounds in the setting of significant chronic venous insufficiency and stasis dermatitis 11/25/17; left anterior wounds superiorly is closed still a small wound inferiorly. 12/02/17; left anterior tibial area. Arrives today with adherent callus. Post debridement clearly not completely closed. Hydrofera Blue under 3 layer compression. 12/09/17; left anterior tibia. Circumferential eschar however the wound bed looks stable to improved. We've been using Hydrofera Blue under 3 layer compression 12/17/17; left anterior tibia. Apparently this was felt to be closed however when the wrap was taken off there is a skin tear to reopen wounds in the same area we've been using Hydrofera Blue under 3 layer compression 12/23/17 left anterior tibia. Not close to close this week apparently the [facility] was stuck  to this again. Still circumferential eschar requiring debridement. I put a contact layer on this this time under the Hydrofera Blue 12/31/17; left anterior tibia. Wound is better slight amount of hyper-granulation. Using Hydrofera Blue over Adaptic. 01/07/18; left anterior tibia. The wound had some surface eschar however after this was removed he has no open wound.he was already revascularized by Dr. Einar [name] when he came to our clinic with atherectomy of the left SFA and popliteal artery. He was also sent to interventional radiology for venous reflux studies. He was not felt to have significant reflux but certainly has chronic venous changes of his skin with hemosiderin deposition around this area. He will definitely need to lubricate his skin and wear compression stocking and I've talked to him about this. READMISSION 05/26/2018 This is a now 72 year old man we cared for with traumatic wounds on his left anterior lower extremity. He had been previously revascularized during that admission by Dr. Einar [name]. Apparently in follow-up Dr. Einar [name] noted that he had deterioration in his arterial status. He underwent a stent placement in the distal left SFA on 04/22/2018. 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 He went back  to the angiography suite on 04/30/2018 he underwent PTA and balloon angioplasty of the occluded left mid anterior tibial artery, thrombotic occlusion went from 100 to 0% which reconstitutes the posterior tibial artery. He had thrombectomy and aspiration of the peroneal artery. The stent placed in the distal SFA left SFA was still occluded. 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 He has been recuperating at [facility] in  [facility] after the most recent hospitalization. He is going home tomorrow. He tells me that 2 weeks ago he traumatized the tip of his left fifth toe. He came in urgently for our review of this. This was a history of before I noted that Dr. Einar [name] had already noted dry gangrenous changes of the left fifth toe 06/09/2018; 2-week follow-up. I did contact Dr. Einar [name] after his last appointment and he apparently saw 1 of Dr. Irven Shelling colleagues the next day. He does not follow-up with Dr. Einar [name] himself until Thursday of this week. 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 Nevertheless there is no evidence of infection no drainage and no pain. He had a new area that this week when we were signing him in today on the left anterior mid tibia area, this is in close proximity to the previous wound we have dealt with in this clinic. 06/23/2018; 2-week follow-up. I did not receive a recent note from Dr. Einar [name] to review today. Our office is trying to obtain this. He is apparently not planning to do further vascular interventions and wondered about compression to try and help with the patient's chronic venous insufficiency. However we are also concerned about the arterial flow. ooHe arrives in clinic today with a new area on the left third toe. The areas on the calf/anterior tibia are close to closing. The left fifth toe is still mummified using Betadine. -In reviewing things with the patient he has what sounds like claudication with mild to moderate amount of activity. 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 I prescribed Levaquin over the phone while we attempted to arrange a plan of care. However the patient called yesterday to report he had low-grade fever and he came in today acutely. There is been a marked deterioration in the left third toe with spreading cellulitis up into the dorsal left foot. He was referred to the emergency  room. Readmission: 06/29/2020 patient presents today for reevaluation here in our clinic he was previously treated by Dr. Dellia Nims at the latter part of 2019 in 2 the beginning of 2020. Subsequently we have not seen him since that time in the interim he did have evaluation with vein and vascular specialist specifically Dr. Anice Paganini who did perform quite extensive work for a left femoral to anterior tibial artery bypass. With that being said in the interim the patient has developed significant lymphedema and has wounds that he tells me have really never healed in regard to the incision site on the left leg. He also has multiple wounds on the feet for various reasons some of which is that he tends to pick at his feet. Fortunately there is no signs of active infection systemically at this time he does have some wounds  that are little bit deeper but most are fairly superficial he seems to have good blood flow and overall everything appears to be healthy I see no bone exposed and no obvious signs of osteomyelitis. I do not know that he necessarily needs a x-ray at this point although that something we could consider depending on how things progress. The patient does have a history of lymphedema, diabetes, this is type II, chronic kidney disease stage III, hypertension, and history of peripheral vascular disease. 07/05/2020; patient admitted last week. Is a patient I remember from 2019 he had a spreading infection involving the left foot and we sent him to the hospital. 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 He subsequently had a left femoral to anterior tibial bypass by Dr.Cain vein and vascular. He also has severe lymphedema with chronic skin changes related to that on the left leg. The most problematic area that was new today was on the left medial great toe. This was apparently a small area last week there was purulent drainage which our intake nurse cultured.  Also areas on the left medial foot and heel left lateral foot. He has 2 areas on the left medial calf left lateral calf in the setting of the severe lymphedema. 07/13/2020 on evaluation today patient appears to be doing better in my opinion compared to his last visit. The good news is there is no signs of active infection systemically and locally I do not see any signs of infection either. He did have an x-ray which was negative that is great news he had a culture which showed MRSA but at the same time he is been on the doxycycline which has helped. I do think we may want to extend this for 7 additional days 1/25; patient admitted to the clinic a few weeks ago. 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 We have been putting him under compression his edema control is a lot better but he is severe verricused skin on the left leg. He is really done quite well he still has an open area on the left medial calf and the left medial first metatarsal head. We have been using silver collagen on the leg silver alginate on the foot MARV, ALFREY ([id number]) [id number] Page 11 of 20 07/27/2020 upon evaluation today patient appears to be doing decently well in regard to his wounds. He still has a lot of dry skin on the left leg. Some of this is starting to peel back and I think he may be able to have them out by removing some that today. Fortunately there is no signs of active infection at this time on the left leg although on the right leg he does appear to have swelling and erythema as well as some mild warmth to touch. This does have been concerned about the possibility of cellulitis although within the differential diagnosis I do think that potentially a DVT has to be at least considered. We need to rule that out before proceeding would just call in the cellulitis. Especially since he is having pain in the posterior aspect of his calf  muscle. 2/8; the patient had seen sparingly. He has severe skin changes of chronic lymphedema in the left leg thickened hyperkeratotic verrucous skin. He has an open wound on the medial part of the left first met head left mid tibia. He also has a rim of nonepithelialized skin in the  anterior mid tibia. He brought in the AmLactin lotion that was been prescribed although I am not sure under compression and its utility. There concern about cellulitis on the right lower leg the last time he was here. He was put on on antibiotics. His DVT rule out was negative. The right leg looks fine he is using his stocking on this area 08/10/2020 upon evaluation today patient appears to be doing well with regard to his leg currently. He has been tolerating the dressing changes without complication. Fortunately there is no signs of active infection which is great news. Overall very pleased with where things stand. 2/22; the patient still has an area on the medial part of the left first met his head. This looks better than when I last saw this earlier this month he has a rim of epithelialization but still some surface debris. Mostly everything on the left leg is healed. There is still a vulnerable in the left mid tibia area. 08/30/2020 upon evaluation today patient appears to be doing much better in regard to his wounds on his foot. 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 Nonetheless I think we will address that today I Minna send in a prescription for him in that regard. Overall though there does not appear to be any signs of significant worsening. 09/07/2020 on evaluation today patient's wounds over his left foot appear to be doing excellent. I do not see any signs of infection there is some callus buildup this can require debridement for certain but overall I feel like he is managing  quite nicely. He still using the AmLactin cream which has been beneficial for him as well. 3/22; left foot wound is closed. There is no open area here. He is using ammonium lactate lotion to the lower extremities to help exfoliate dry cracked skin. He has compression stockings from elastic therapy in [place]. The wound on the medial part of his left first met head is healed today. READMISSION 04/12/2021 Mr. Briones is a patient we know fairly well he had a prolonged stay in clinic in 2019 with wounds on his left lateral and left anterior lower extremity in the setting of chronic venous insufficiency. More recently he was here earlier this year with predominantly an area on his left foot first metatarsal head plantar and he says the plantar foot broke down on its not long after we discharged him but he did not come back here. The last few months areas of broken down on his left anterior and again the left lateral lower extremity. The leg itself is very swollen chronically enlarged a lot of hyperkeratotic dry Berry Q skin in the left lower leg. His edema extends well into the thigh. He was seen by Dr. Donzetta Matters. He had ABIs on 03/02/2021 showing an ABI on the right of 1 with a TBI of 0.72 his ABI in the left at 1.09 TBI of 0.99. Monophasic and biphasic waveforms on the right. On the left monophasic waveforms were noted he went on to have an angiogram on 03/27/2021 this showed the aortic aortic and iliac segments were free of flow-limiting stenosis the left common femoral vein to evaluate the left femoral to anterior tibial artery bypass was unobstructed the bypass was patent without any areas of stenosis. We discharged the patient in bilateral juxta lite stockings but very clearly that was not sufficient  to control the swelling and maintain skin integrity. He is clearly going to need compression pumps. 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 This is  fortunate because he is on his feet for long periods of time. 10/27; patient comes in with our intake nurse reporting copious amount of green drainage from the left anterior mid tibia the left dorsal foot and to a lesser extent the left medial mid tibia. We left the compression wrap on all week for the amount of edema in his left leg is quite a bit better. We use silver alginate as the primary dressing 11/3; edema control is good. Left anterior lower leg left medial lower leg and the plantar first metatarsal head. The left anterior lower leg required debridement. Deep tissue culture I did of this wound showed MRSA I put him on 10 days of doxycycline which she will start today. We have him in compression wraps. He has a security card and [employer] however he is retiring on November 15. We will need to then get him into a better offloading boot for the left foot perhaps a total contact cast 11/10; edema control is quite good. Left anterior and left medial lower leg wounds in the setting of chronic venous insufficiency and lymphedema. He also has a substantial area over the left plantar first metatarsal head. 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 He has significant hypergranulation on the left plantar foot wound. The patient is a diabetic but he does not have significant PAD 11/17; edema control is quite good. Left anterior and left medial lower leg wounds look better. The really concerning area remains the area on the left plantar first metatarsal head. He has a rim of epithelialization. 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 Starting today with a forefoot off loader but . possibly a total contact cast. 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 Arrives with a very significant odor from I think all of the wounds on the left leg and the left foot. Because of this I did not put a total contact cast on him today but will could still consider this. His wife was having cataract surgery which is the reason he missed the appointment 12/6. I saw this man 5 days ago with a swelling below the popliteal fossa. I thought he actually might have a Baker's cyst however the DVT rule out study that we could arrange right away was negative the technician told me this was not a ruptured Baker's cyst. We attempted to get this aspirated by under ultrasound guidance in interventional radiology however all they did was an ultrasound however it shows an extensive fluid collection 62 x 8 x 9.4 in the left thigh and left calf. The patient states he thinks this started 8 days ago or so but he really is not complaining of any pain, fever or systemic symptoms. He has not ha 12/20; after some difficulty I managed to get the patient into see Dr. Donzetta Matters. Eventually he was taken into the hospital and had a drain put in the  fluid collection below his left knee posteriorly extending into the posterior thigh. He still has the drain in place. Culture of this showed moderate staff aureus few Morganella and few Klebsiella he is now on doxycycline and ciprofloxacin as suggested by infectious disease he is on this for a month. The drain will remain in place until it stops draining 12/29; he comes in today with the 1 wound on his left leg and the area on the left plantar first met head significantly smaller. Both look healthy. He still has the drain in the left leg. He says he has to change this daily. Follows up with Dr. Donzetta Matters on January 11. 06/29/2021; the wounds that I am following on the left leg and left first met head continued to be quite healthy. However the area where his inferior drain is in place had copious  amounts of drainage which was green in color. The wound here is larger. Follows up with Dr. Gwenlyn Saran of vein and vascular his surgeon next week as well as infectious disease. He remains on ciprofloxacin and doxycycline. He is not complaining of excessive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

## 2022-07-19 NOTE — Progress Notes (Signed)
PACER, DORN ([id number]) [id number] Page 1 of 10 Visit Report for 07/19/2022 Arrival Information Details Patient Name: Date of Service: [name], [name] 07/19/2022 9:15 A M Medical Record Number: [id number] Patient Account Number: [id number] Date of Birth/Sex: Treating RN: 16-Aug-1950 (72 y.o. [name] Primary Care Anya Murphey: [name] Other Clinician: Referring Shanisha Lech: Treating Abriel Geesey/Extender: Bonnielee [name] in Treatment: 85 Visit Information History Since Last Visit Added or deleted any medications: No Patient Arrived: Ambulatory Any new allergies or adverse reactions: No Arrival Time: 09:13 Had a fall or experienced change in No Accompanied By: self activities of daily living that may affect Transfer Assistance: None risk of falls: Patient Requires Transmission-Based Precautions: No Signs or symptoms of abuse/neglect since last visito No Patient Has Alerts: Yes Hospitalized since last visit: No Implantable device outside of the clinic excluding No cellular tissue based products placed in the center since last visit: Has Dressing in Place as Prescribed: Yes Pain Present Now: No Electronic Signature(s) Signed: 07/19/2022 4:08:23 PM By: Blanche East RN Entered By: Blanche East on 07/19/2022 09:13:51 -------------------------------------------------------------------------------- Encounter Discharge Information Details Patient Name: Date of Service: [name]. 07/19/2022 9:15 A M Medical Record Number: [id number] Patient Account Number: [id number] Date of Birth/Sex: Treating RN: [birth year] (72 y.o. [name] Primary Care Jameria Bradway: [name] Other Clinician: Referring Cameren Odwyer: Treating Cherye Gaertner/Extender: Bonnielee [name] in Treatment: 49 Encounter Discharge Information Items Post Procedure Vitals Discharge Condition: Stable Temperature (F): 98.6 Ambulatory Status:  Ambulatory Pulse (bpm): 70 Discharge Destination: Home Respiratory Rate (breaths/min): 16 Transportation: Private Auto Blood Pressure (mmHg): 147/81 Accompanied By: self Schedule Follow-up Appointment: Yes Clinical Summary of Care: Electronic Signature(s) Signed: 07/19/2022 4:08:23 PM By: Blanche East RN Entered By: Blanche East on 07/19/2022 10:13:52 [name] ([id number]) [id number] Page 2 of 10 -------------------------------------------------------------------------------- Lower Extremity Assessment Details Patient Name: Date of Service: [name], [name] 07/19/2022 9:15 A M Medical Record Number: [id number] Patient Account Number: [id number] Date of Birth/Sex: Treating RN: 02-16-1951 (72 y.o. [name] Primary Care Marien Manship: [name] Other Clinician: Referring Geovani Tootle: Treating Adriana Quinby/Extender: Bonnielee [name] in Treatment: 66 Edema Assessment Assessed: [name]: No] [Right: No] Edema: [Left: Ye] [Right: s] Calf Left: Right: Point of Measurement: 41 cm From Medial Instep 47.5 cm Ankle Left: Right: Point of Measurement: 10 cm From Medial Instep 30 cm Vascular Assessment Pulses: Dorsalis Pedis Palpable: [Left:Yes] Electronic Signature(s) Signed: 07/19/2022 10:18:34 AM By: Blanche East RN Entered By: Blanche East on 07/19/2022 10:18:34 -------------------------------------------------------------------------------- Multi Wound Chart Details Patient Name: Date of Service: [name]. 07/19/2022 9:15 A M Medical Record Number: [id number] Patient Account Number: [id number] Date of Birth/Sex: Treating RN: 06-16-1951 (72 y.o. M) Primary Care Jaicion Laurie: [name] Other Clinician: Referring Eshani Maestre: Treating Leibish Mcgregor/Extender: Bonnielee [name] in Treatment: 66 Vital Signs Height(in): 74 Capillary Blood Glucose(mg/dl): 246 Weight(lbs): 238 Pulse(bpm): 70 Body Mass Index(BMI):  30.6 Blood Pressure(mmHg): 147/81 Temperature(F): 98.6 Respiratory Rate(breaths/min): 16 [18R:Photos:] [30:[id number] Page 3 of 10] Left, Plantar Metatarsal head first Left, Proximal, Lateral Lower Leg Left, Anterior Lower Leg Wound Location: Gradually Appeared Bump Shear/Friction Wounding Event: 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 Primary Etiology: Extremity Glaucoma, Sleep Apnea, Glaucoma, Sleep Apnea, Glaucoma, Sleep Apnea, Comorbid History: Hypertension, Peripheral Arterial Hypertension, Peripheral Arterial Hypertension, Peripheral Arterial Disease, Peripheral Venous Disease, Disease, Peripheral Venous Disease, Disease, Peripheral Venous Disease, Type II Diabetes, Gout, Osteoarthritis, Type II Diabetes, Gout, Osteoarthritis, Type II Diabetes, Gout, Osteoarthritis, Neuropathy Neuropathy Neuropathy 08/23/2020 06/03/2021  06/21/2022 Date Acquired: 41 58 4 Weeks of Treatment: Open Open Open Wound Status: Yes No No Wound Recurrence: No Yes No Clustered Wound: N/A 3 N/A Clustered Quantity: 1.1x2x0.1 4.6x1.8x0.1 0.5x0.5x0.1 Measurements L x W x D (cm) 1.728 6.503 0.196 A (cm) : rea 0.173 0.65 0.02 Volume (cm) : 87.80% -294.40% 50.10% % Reduction in A rea: 93.90% 50.70% 48.70% % Reduction in Volume: Grade 2 Full Thickness With Exposed Support Full Thickness Without Exposed Classification: Structures Support Structures Medium Medium Medium Exudate A mount: Serosanguineous Serosanguineous Serous Exudate Type: red, brown red, brown amber Exudate Color: Flat and Intact Fibrotic scar, thickened scar Distinct, outline attached Wound Margin: Small (1-33%) Large (67-100%) Large (67-100%) Granulation A mount: Red Red, Pink Red Granulation Quality: Large (67-100%) Small (1-33%) Small (1-33%) Necrotic A mount: Adherent Slough Eschar, Adherent Slough Adherent Slough Necrotic Tissue: Fat Layer (Subcutaneous Tissue): Yes Fat Layer  (Subcutaneous Tissue): Yes Fat Layer (Subcutaneous Tissue): Yes Exposed Structures: Fascia: No Fascia: No Fascia: No Tendon: No Tendon: No Tendon: No Muscle: No Muscle: No Muscle: No Joint: No Joint: No Joint: No Bone: No Bone: No Bone: No Small (1-33%) Medium (34-66%) Large (67-100%) Epithelialization: N/A Debridement - Selective/Open Wound N/A Debridement: Pre-procedure Verification/Time Out N/A 09:42 N/A Taken: N/A Slough N/A Tissue Debrided: N/A Non-Viable Tissue N/A Level: N/A 8.28 N/A Debridement A (sq cm): rea N/A Curette N/A Instrument: N/A Minimum N/A Bleeding: N/A Pressure N/A Hemostasis A chieved: N/A Procedure was tolerated well N/A Debridement Treatment Response: N/A 4.6x1.8x0.1 N/A Post Debridement Measurements L x W x D (cm) N/A 0.65 N/A Post Debridement Volume: (cm) Callus: Yes Scarring: Yes No Abnormalities Noted Periwound Skin Texture: Dry/Scaly: Yes Dry/Scaly: Yes Maceration: Yes Periwound Skin Moisture: Maceration: No No Abnormalities Noted Hemosiderin Staining: Yes Hemosiderin Staining: Yes Periwound Skin Color: No Abnormality No Abnormality No Abnormality Temperature: N/A Yes N/A Tenderness on Palpation: T Contact Cast otal Debridement T Contact Cast otal Procedures Performed: Treatment Notes Electronic Signature(s) Signed: 07/19/2022 9:49:03 AM By: Fredirick Maudlin MD FACS Entered By: Fredirick Maudlin on 07/19/2022 09:49:03 -------------------------------------------------------------------------------- Multi-Disciplinary Care Plan Details Patient Name: Date of Service: [name]. 07/19/2022 9:15 A M Medical Record Number: [id number] Patient Account Number: [id number] Date of Birth/Sex: Treating RN: [birth year] (72 y.o. [name] Primary Care Tramayne Sebesta: [name] Other Clinician: Referring Tahisha Hakim: Treating Romy Mcgue/Extender: Bonnielee [name] in Treatment: [address], [place] ([id number])  [id number] Page 4 of 10 Multidisciplinary Care Plan reviewed with physician Active Inactive Venous Leg Ulcer Nursing Diagnoses: Knowledge deficit related to disease process and management Potential for venous Insuffiency (use before diagnosis confirmed) Goals: Patient will maintain optimal edema control Date Initiated: 07/27/2021 Target Resolution Date: 02/23/2023 Goal Status: Active Interventions: Assess peripheral edema status every visit. Treatment Activities: Therapeutic compression applied : 07/27/2021 Notes: Wound/Skin Impairment Nursing Diagnoses: Impaired tissue integrity Knowledge deficit related to ulceration/compromised skin integrity Goals: Patient will have a decrease in wound volume by X% from date: (specify in notes) Date Initiated: 04/12/2021 Date Inactivated: 01/04/2022 Target Resolution Date: 04/23/2021 Goal Status: Met Patient/caregiver will verbalize understanding of skin care regimen Date Initiated: 01/04/2022 Target Resolution Date: 02/23/2023 Goal Status: Active Ulcer/skin breakdown will have a volume reduction of 30% by week 4 Date Initiated: 04/12/2021 Date Inactivated: 04/27/2021 Target Resolution Date: 04/27/2021 Goal Status: Unmet Unmet Reason: infection Ulcer/skin breakdown will have a volume reduction of 50% by week 8 Date Initiated: 04/27/2021 Date Inactivated: 06/29/2021 Target Resolution Date: 06/24/2021 Goal Status: Met Interventions: Assess patient/caregiver ability to obtain necessary supplies  Assess patient/caregiver ability to perform ulcer/skin care regimen upon admission and as needed Assess ulceration(s) every visit Notes: Electronic Signature(s) Signed: 07/19/2022 4:08:23 PM By: Blanche East RN Entered By: Blanche East on 07/19/2022 09:48:03 -------------------------------------------------------------------------------- Pain Assessment Details Patient Name: Date of Service: [name]. 07/19/2022 9:15 A  M Medical Record Number: [id number] Patient Account Number: [id number] Date of Birth/Sex: Treating RN: 26-Jun-1950 (72 y.o. [name] Primary Care Moesha Sarchet: [name] Other Clinician: Referring Suzannah Bettes: Treating Houa Nie/Extender: Bonnielee [name] in Treatment: 62 Active Problems Location of Pain Severity and Description of Pain Patient Has Paino No RAMSAY, BOGNAR ([id number]) [id number] Page 5 of 10 Patient Has Paino No Site Locations Rate the pain. Current Pain Level: 0 Pain Management and Medication Current Pain Management: Electronic Signature(s) Signed: 07/19/2022 4:08:23 PM By: Blanche East RN Entered By: Blanche East on 07/19/2022 09:14:18 -------------------------------------------------------------------------------- Patient/Caregiver Education Details Patient Name: Date of Service: [name] 1/25/2024andnbsp9:15 A M Medical Record Number: [id number] Patient Account Number: [id number] Date of Birth/Gender: Treating RN: 01/15/1951 (72 y.o. [name] Primary Care Physician: [name] Other Clinician: Referring Physician: Treating Physician/Extender: Bonnielee [name] in Treatment: 73 Education Assessment Education Provided To: Patient Education Topics Provided Elevated Blood Sugar/ Impact on Healing: Methods: Explain/Verbal Responses: Reinforcements needed, State content correctly Wound Debridement: Methods: Explain/Verbal Responses: State content correctly Wound/Skin Impairment: Methods: Explain/Verbal Responses: Reinforcements needed, State content correctly Electronic Signature(s) Signed: 07/19/2022 4:08:23 PM By: Blanche East RN Entered By: Blanche East on 07/19/2022 09:15:44 [name] ([id number]) [id number] Page 6 of 10 -------------------------------------------------------------------------------- Wound Assessment Details Patient  Name: Date of Service: [name], [name] 07/19/2022 9:15 A M Medical Record Number: [id number] Patient Account Number: [id number] Date of Birth/Sex: Treating RN: 11-25-1950 (72 y.o. [name] Primary Care Scottie Metayer: [name] Other Clinician: Referring Kyriana Yankee: Treating Lorane Cousar/Extender: Bonnielee [name] in Treatment: 66 Wound Status Wound Number: 18R Primary Diabetic Wound/Ulcer of the Lower Extremity Etiology: Wound Location: Left, Plantar Metatarsal head first Wound Open Wounding Event: Gradually Appeared Status: Date Acquired: 08/23/2020 Comorbid Glaucoma, Sleep Apnea, Hypertension, Peripheral Arterial Disease, Weeks Of Treatment: 66 History: Peripheral Venous Disease, Type II Diabetes, Gout, Osteoarthritis, Clustered Wound: No Neuropathy Photos Wound Measurements Length: (cm) Width: (cm) Depth: (cm) Area: (cm) Volume: (cm) 1.1 % Reduction in Area: 87.8% 2 % Reduction in Volume: 93.9% 0.1 Epithelialization: Small (1-33%) 1.728 Tunneling: No 0.173 Undermining: No Wound Description Classification: Grade 2 Wound Margin: Flat and Intact Exudate Amount: Medium Exudate Type: Serosanguineous Exudate Color: red, brown Foul Odor After Cleansing: No Slough/Fibrino No Wound Bed Granulation Amount: Small (1-33%) Exposed Structure Granulation Quality: Red Fascia Exposed: No Necrotic Amount: Large (67-100%) Fat Layer (Subcutaneous Tissue) Exposed: Yes Necrotic Quality: Adherent Slough Tendon Exposed: No Muscle Exposed: No Joint Exposed: No Bone Exposed: No Periwound Skin Texture Texture Color No Abnormalities Noted: No No Abnormalities Noted: Yes Callus: Yes Temperature / Pain Temperature: No Abnormality Moisture No Abnormalities Noted: No Dry / Scaly: Yes Maceration: No Treatment Notes Wound #18R (Metatarsal head first) Wound Laterality: Plantar, Left [name], [name] ([id number]) [id number] Page 7 of  10 Cleanser Soap and Water Discharge Instruction: May shower and wash wound with dial antibacterial soap and water prior to dressing change. Wound Cleanser Discharge Instruction: Cleanse the wound with wound cleanser prior to applying a clean dressing using gauze sponges, not tissue or cotton balls. Peri-Wound Care Topical Primary Dressing Sorbalgon AG Dressing 2x2 (in/in) Discharge Instruction: Apply to wound bed as  instructed Secondary Dressing Zetuvit Plus Silicone Border Dressing 4x4 (in/in) Discharge Instruction: Apply silicone border over primary dressing as directed. Secured With Compression Wrap Compression Stockings Environmental education officer) Signed: 07/19/2022 4:08:23 PM By: Blanche East RN Entered By: Blanche East on 07/19/2022 09:31:15 -------------------------------------------------------------------------------- Wound Assessment Details Patient Name: Date of Service: [name]. 07/19/2022 9:15 A M Medical Record Number: [id number] Patient Account Number: [id number] Date of Birth/Sex: Treating RN: [birth year] (72 y.o. [name] Primary Care Aleasha Fregeau: [name] Other Clinician: Referring Chanelle Hodsdon: Treating Amarys Sliwinski/Extender: Bonnielee [name] in Treatment: 66 Wound Status Wound Number: 22 Primary Cyst Etiology: Wound Location: Left, Proximal, Lateral Lower Leg Wound Open Wounding Event: Bump Status: Date Acquired: 06/03/2021 Comorbid Glaucoma, Sleep Apnea, Hypertension, Peripheral Arterial Disease, Weeks Of Treatment: 58 History: Peripheral Venous Disease, Type II Diabetes, Gout, Osteoarthritis, Clustered Wound: Yes Neuropathy Photos Wound Measurements Length: (cm) 4.6 Width: (cm) 1.8 Depth: (cm) 0.1 Clustered Quantity: 3 Area: (cm) 6.503 [name] ([id number]) Volume: (cm) 0.65 % Reduction in Area: -294.4% % Reduction in Volume: 50.7% Epithelialization: Medium (34-66%) Tunneling: No Undermining:  No [id number] Page 8 of 10 Wound Description Classification: Full Thickness With Exposed Support Structures Wound Margin: Fibrotic scar, thickened scar Exudate Amount: Medium Exudate Type: Serosanguineous Exudate Color: red, brown Foul Odor After Cleansing: No Slough/Fibrino Yes Wound Bed Granulation Amount: Large (67-100%) Exposed Structure Granulation Quality: Red, Pink Fascia Exposed: No Necrotic Amount: Small (1-33%) Fat Layer (Subcutaneous Tissue) Exposed: Yes Necrotic Quality: Eschar, Adherent Slough Tendon Exposed: No Muscle Exposed: No Joint Exposed: No Bone Exposed: No Periwound Skin Texture Texture Color No Abnormalities Noted: No No Abnormalities Noted: No Scarring: Yes Hemosiderin Staining: Yes Moisture Temperature / Pain No Abnormalities Noted: No Temperature: No Abnormality Dry / Scaly: Yes Tenderness on Palpation: Yes Treatment Notes Wound #22 (Lower Leg) Wound Laterality: Left, Lateral, Proximal Cleanser Soap and Water Discharge Instruction: May shower and wash wound with dial antibacterial soap and water prior to dressing change. Wound Cleanser Discharge Instruction: Cleanse the wound with wound cleanser prior to applying a clean dressing using gauze sponges, not tissue or cotton balls. Peri-Wound Care Sween Lotion (Moisturizing lotion) Discharge Instruction: Apply moisturizing lotion to the leg Topical Skintegrity Hydrogel 4 (oz) Discharge Instruction: Apply hydrogel as directed Primary Dressing Endoform 2x2 in Discharge Instruction: Moisten with Hydrogel or saline Secondary Dressing ABD Pad, 8x10 Discharge Instruction: Apply over primary dressing as directed. Secured With Elastic Bandage 4 inch (ACE bandage) Discharge Instruction: Secure with ACE bandage as directed. Compression Wrap Compression Stockings Add-Ons Electronic Signature(s) Signed: 07/19/2022 4:08:23 PM By: Blanche East RN Entered By: Blanche East  on 07/19/2022 09:30:20 [name] ([id number]) [id number] Page 9 of 10 -------------------------------------------------------------------------------- Wound Assessment Details Patient Name: Date of Service: [name], [name] 07/19/2022 9:15 A M Medical Record Number: [id number] Patient Account Number: [id number] Date of Birth/Sex: Treating RN: 09-15-1950 (72 y.o. [name] Primary Care Nautika Cressey: [name] Other Clinician: Referring Lamara Brecht: Treating Kloe Oates/Extender: Bonnielee [name] in Treatment: 66 Wound Status Wound Number: 30 Primary Abrasion Etiology: Wound Location: Left, Anterior Lower Leg Wound Open Wounding Event: Shear/Friction Status: Date Acquired: 06/21/2022 Comorbid Glaucoma, Sleep Apnea, Hypertension, Peripheral Arterial Disease, Weeks Of Treatment: 4 History: Peripheral Venous Disease, Type II Diabetes, Gout, Osteoarthritis, Clustered Wound: No Neuropathy Photos Wound Measurements Length: (cm) 0.5 Width: (cm) 0.5 Depth: (cm) 0.1 Area: (cm) 0.196 Volume: (cm) 0.02 % Reduction in Area: 50.1% % Reduction in Volume: 48.7% Epithelialization: Large (67-100%) Tunneling: No Undermining: No Wound  Description Classification: Full Thickness Without Exposed Support Structures Wound Margin: Distinct, outline attached Exudate Amount: Medium Exudate Type: Serous Exudate Color: amber Foul Odor After Cleansing: No Slough/Fibrino Yes Wound Bed Granulation Amount: Large (67-100%) Exposed Structure Granulation Quality: Red Fascia Exposed: No Necrotic Amount: Small (1-33%) Fat Layer (Subcutaneous Tissue) Exposed: Yes Necrotic Quality: Adherent Slough Tendon Exposed: No Muscle Exposed: No Joint Exposed: No Bone Exposed: No Periwound Skin Texture Texture Color No Abnormalities Noted: Yes No Abnormalities Noted: No Hemosiderin Staining: Yes Moisture No Abnormalities Noted: No Temperature /  Pain Maceration: Yes Temperature: No Abnormality Treatment Notes Wound #30 (Lower Leg) Wound Laterality: Left, Anterior Cleanser Soap and Water Discharge Instruction: May shower and wash wound with dial antibacterial soap and water prior to dressing change. Wound Cleanser Discharge Instruction: Cleanse the wound with wound cleanser prior to applying a clean dressing using gauze sponges, not tissue or cotton balls. Peri-Wound Care [name], [name] ([id number]) [id number] Page 10 of 10 Topical Primary Dressing Sorbalgon AG Dressing 2x2 (in/in) Discharge Instruction: Apply to wound bed as instructed Secondary Dressing Zetuvit Plus Silicone Border Dressing 4x4 (in/in) Discharge Instruction: Apply silicone border over primary dressing as directed. Secured With Compression Wrap CoFlex TLC Zinc Compression Stockings Add-Ons Electronic Signature(s) Signed: 07/19/2022 4:08:23 PM By: Blanche East RN Entered By: Blanche East on 07/19/2022 09:32:19 -------------------------------------------------------------------------------- Vitals Details Patient Name: Date of Service: [name]. 07/19/2022 9:15 A M Medical Record Number: [id number] Patient Account Number: [id number] Date of Birth/Sex: Treating RN: 02-19-1951 (72 y.o. [name] Primary Care Leeum Sankey: [name] Other Clinician: Referring Aroura Vasudevan: Treating [name]/Extender: Bonnielee [name] in Treatment: 31 Vital Signs Time Taken: 09:11 Temperature (F): 98.6 Height (in): 74 Pulse (bpm): 70 Weight (lbs): 238 Respiratory Rate (breaths/min): 16 Body Mass Index (BMI): 30.6 Blood Pressure (mmHg): 147/81 Capillary Blood Glucose (mg/dl): 246 Reference Range: 80 - 120 mg / dl Electronic Signature(s) Signed: 07/19/2022 4:08:23 PM By: Blanche East RN Entered By: Blanche East on 07/19/2022 09:14:11

## 2022-07-26 ENCOUNTER — Encounter (HOSPITAL_BASED_OUTPATIENT_CLINIC_OR_DEPARTMENT_OTHER): Payer: Medicare Other | Attending: General Surgery | Admitting: General Surgery

## 2022-07-26 DIAGNOSIS — H409 Unspecified glaucoma: Secondary | ICD-10-CM | POA: Diagnosis not present

## 2022-07-26 DIAGNOSIS — M19072 Primary osteoarthritis, left ankle and foot: Secondary | ICD-10-CM | POA: Insufficient documentation

## 2022-07-26 DIAGNOSIS — I129 Hypertensive chronic kidney disease with stage 1 through stage 4 chronic kidney disease, or unspecified chronic kidney disease: Secondary | ICD-10-CM | POA: Insufficient documentation

## 2022-07-26 DIAGNOSIS — N183 Chronic kidney disease, stage 3 unspecified: Secondary | ICD-10-CM | POA: Diagnosis not present

## 2022-07-26 DIAGNOSIS — I87322 Chronic venous hypertension (idiopathic) with inflammation of left lower extremity: Secondary | ICD-10-CM | POA: Diagnosis not present

## 2022-07-26 DIAGNOSIS — Z87891 Personal history of nicotine dependence: Secondary | ICD-10-CM | POA: Insufficient documentation

## 2022-07-26 DIAGNOSIS — G4730 Sleep apnea, unspecified: Secondary | ICD-10-CM | POA: Insufficient documentation

## 2022-07-26 DIAGNOSIS — E1151 Type 2 diabetes mellitus with diabetic peripheral angiopathy without gangrene: Secondary | ICD-10-CM | POA: Diagnosis not present

## 2022-07-26 DIAGNOSIS — E1122 Type 2 diabetes mellitus with diabetic chronic kidney disease: Secondary | ICD-10-CM | POA: Insufficient documentation

## 2022-07-26 DIAGNOSIS — Z833 Family history of diabetes mellitus: Secondary | ICD-10-CM | POA: Insufficient documentation

## 2022-07-26 DIAGNOSIS — L97828 Non-pressure chronic ulcer of other part of left lower leg with other specified severity: Secondary | ICD-10-CM | POA: Diagnosis present

## 2022-07-26 DIAGNOSIS — E1140 Type 2 diabetes mellitus with diabetic neuropathy, unspecified: Secondary | ICD-10-CM | POA: Insufficient documentation

## 2022-07-26 DIAGNOSIS — I890 Lymphedema, not elsewhere classified: Secondary | ICD-10-CM | POA: Diagnosis not present

## 2022-07-26 DIAGNOSIS — M109 Gout, unspecified: Secondary | ICD-10-CM | POA: Insufficient documentation

## 2022-07-26 DIAGNOSIS — E11621 Type 2 diabetes mellitus with foot ulcer: Secondary | ICD-10-CM | POA: Diagnosis not present

## 2022-07-26 NOTE — Progress Notes (Signed)
LAAKEA, PEREIRA ([id number]) [id number] Page 1 of 11 Visit Report for 07/26/2022 Arrival Information Details Patient Name: Date of Service: [name], [name] 07/26/2022 10:45 A M Medical Record Number: [id number] Patient Account Number: [id number] Date of Birth/Sex: Treating RN: [birth year] (72 y.o. [name] Primary Care Kiowa Hollar: [name] Other Clinician: Referring Jersee Winiarski: Treating Rody Keadle/Extender: [name] in Treatment: 53 Visit Information History Since Last Visit Added or deleted any medications: No Patient Arrived: Ambulatory Any new allergies or adverse reactions: No Arrival Time: 10:36 Had a fall or experienced change in No Accompanied By: self activities of daily living that may affect Transfer Assistance: None risk of falls: Patient Identification Verified: Yes Signs or symptoms of abuse/neglect since last No Secondary Verification Process Completed: Yes visito Patient Requires Transmission-Based Precautions: No Hospitalized since last visit: No Patient Has Alerts: Yes Implantable device outside of the clinic No excluding cellular tissue based products placed in the center since last visit: Has Dressing in Place as Prescribed: Yes Has Footwear/Offloading in Place as Yes Prescribed: Left: Removable Cast Walker/Walking Boot Pain Present Now: No Electronic Signature(s) Signed: 07/26/2022 4:48:37 PM By: Blanche East RN Entered By: Blanche East on 07/26/2022 10:52:58 -------------------------------------------------------------------------------- Encounter Discharge Information Details Patient Name: Date of Service: [name] Garfinkel. 07/26/2022 10:45 A M Medical Record Number: [id number] Patient Account Number: [id number] Date of Birth/Sex: Treating RN: Feb 11, 1951 (72 y.o. [name] Primary Care Eniola Cerullo: [name] Other Clinician: Referring Raynell Scott: Treating Tomica Arseneault/Extender: [name] in Treatment: 86 Encounter Discharge Information Items Post Procedure Vitals Discharge Condition: Stable Temperature (F): 98.3 Ambulatory Status: Ambulatory Pulse (bpm): 60 Discharge Destination: Home Respiratory Rate (breaths/min): 18 Transportation: Private Auto Blood Pressure (mmHg): 116/66 Accompanied By: self Schedule Follow-up Appointment: Yes Clinical Summary of Care: Electronic Signature(s) Signed: 07/26/2022 11:52:45 AM By: Blanche East RN Entered By: Blanche East on 07/26/2022 11:52:45 [name] ([id number]) [id number] Page 2 of 11 -------------------------------------------------------------------------------- Lower Extremity Assessment Details Patient Name: Date of Service: [name], [name] 07/26/2022 10:45 A M Medical Record Number: [id number] Patient Account Number: [id number] Date of Birth/Sex: Treating RN: Apr 21, 1951 (72 y.o. [name] Primary Care Eren Ryser: [name] Other Clinician: Referring Fathima Bartl: Treating Leonte Horrigan/Extender: [name] in Treatment: 67 Edema Assessment Assessed: [Left: No] [Right: No] Edema: [Left: Ye] [Right: s] Calf Left: Right: Point of Measurement: 41 cm From Medial Instep 47.5 cm Ankle Left: Right: Point of Measurement: 10 cm From Medial Instep 30 cm Electronic Signature(s) Signed: 07/26/2022 4:48:37 PM By: Blanche East RN Entered By: Blanche East on 07/26/2022 10:53:22 -------------------------------------------------------------------------------- Multi Wound Chart Details Patient Name: Date of Service: [name] Garfinkel. 07/26/2022 10:45 A M Medical Record Number: [id number] Patient Account Number: [id number] Date of Birth/Sex: Treating RN: 1950/08/16 (72 y.o. 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 Primary Etiology: Extremity Glaucoma, Sleep Apnea, Glaucoma, Sleep Apnea, Glaucoma, Sleep Apnea, Comorbid History: GUINN, DELAROSA ([id number]) [id number] Page 3 of 11 Hypertension, Peripheral ArterialHypertension, Peripheral Arterial Hypertension, Peripheral Arterial Disease, Peripheral Venous Disease, Disease, Peripheral Venous Disease, Disease, Peripheral Venous Disease, Type II Diabetes, Gout, Osteoarthritis, Type  II Diabetes, Gout, Osteoarthritis, Type II Diabetes, Gout, Osteoarthritis, Neuropathy Neuropathy Neuropathy 08/23/2020 06/03/2021 06/21/2022 Date Acquired: 33 59 5 Weeks of Treatment: Open Open Open Wound Status: Yes No No Wound Recurrence: No Yes No Clustered Wound: N/A 3 N/A Clustered Quantity: 0.8x1.2x0.1 3x0.6x0.1 1.6x0.6x0.1 Measurements L x W x D (cm) 0.754 1.414 0.754 A (cm) : rea 0.075 0.141 0.075 Volume (cm) : 94.70% 14.30% -91.90% % Reduction in A rea: 97.30% 89.30% -92.30% % Reduction in Volume: Grade 2 Full Thickness With Exposed Support Full Thickness Without Exposed Classification: Structures Support Structures Medium Medium Medium Exudate A mount: Serosanguineous Serosanguineous Serous Exudate Type: red, brown red, brown amber Exudate Color: Flat and Intact Fibrotic scar, thickened scar Distinct, outline attached Wound Margin: Small (1-33%) Large (67-100%) Medium (34-66%) Granulation A mount: Red Red, Pink Red Granulation Quality: Large (67-100%) Small (1-33%) Medium (34-66%) Necrotic A  mount: Adherent Slough Eschar, Adherent Slough Adherent Slough Necrotic Tissue: Fat Layer (Subcutaneous Tissue): Yes Fat Layer (Subcutaneous Tissue): Yes Fat Layer (Subcutaneous Tissue): Yes Exposed Structures: Fascia: No Fascia: No Fascia: No Tendon: No Tendon: No Tendon: No Muscle: No Muscle: No Muscle: No Joint: No Joint: No Joint: No Bone: No Bone: No Bone: No Small (1-33%) Medium (34-66%) Large (67-100%) Epithelialization: N/A Debridement - Selective/Open Wound N/A Debridement: Pre-procedure Verification/Time Out N/A 11:26 N/A Taken: N/A Lidocaine 4% Topical Solution N/A Pain Control: N/A Slough N/A Tissue Debrided: N/A Non-Viable Tissue N/A Level: N/A 1.8 N/A Debridement A (sq cm): rea N/A Curette N/A Instrument: N/A Minimum N/A Bleeding: N/A Pressure N/A Hemostasis A chieved: N/A Procedure was tolerated well N/A Debridement Treatment Response: N/A 3x0.6x0.1 N/A Post Debridement Measurements L x W x D (cm) N/A 0.141 N/A Post Debridement Volume: (cm) Callus: Yes Scarring: Yes No Abnormalities Noted Periwound Skin Texture: Dry/Scaly: Yes Dry/Scaly: Yes Maceration: Yes Periwound Skin Moisture: Maceration: No No Abnormalities Noted Hemosiderin Staining: Yes Hemosiderin Staining: Yes Periwound Skin Color: No Abnormality No Abnormality No Abnormality Temperature: N/A Yes N/A Tenderness on Palpation: T Contact Cast otal Debridement T Contact Cast otal Procedures Performed: Treatment Notes Wound #18R (Metatarsal head first) Wound Laterality: Plantar, Left Cleanser Soap and Water Discharge Instruction: May shower and wash wound with dial antibacterial soap and water prior to dressing change. Wound Cleanser Discharge Instruction: Cleanse the wound with wound cleanser prior to applying a clean dressing using gauze sponges, not tissue or cotton balls. Peri-Wound Care Topical Primary Dressing Sorbalgon AG Dressing 2x2 (in/in) Discharge  Instruction: Apply to wound bed as instructed Secondary Dressing Zetuvit Plus Silicone Border Dressing 4x4 (in/in) Discharge Instruction: Apply silicone border over primary dressing as directed. Secured With Compression Wrap Compression Stockings Add-Ons ZAIDIN, BLYDEN ([id number]) [id number] Page 4 of 11 Wound #22 (Lower Leg) Wound Laterality: Left, Lateral, Proximal Cleanser Soap and Water Discharge Instruction: May shower and wash wound with dial antibacterial soap and water prior to dressing change. Wound Cleanser Discharge Instruction: Cleanse the wound with wound cleanser prior to applying a clean dressing using gauze sponges, not tissue or cotton balls. Peri-Wound Care Sween Lotion (Moisturizing lotion) Discharge Instruction: Apply moisturizing lotion to the leg Topical Skintegrity Hydrogel 4 (oz) Discharge Instruction: Apply hydrogel as directed Primary Dressing Endoform 2x2 in Discharge Instruction: Moisten with Hydrogel or saline Secondary Dressing ABD Pad, 8x10 Discharge Instruction: Apply over primary dressing as directed. Secured With Elastic Bandage 4 inch (ACE bandage) Discharge Instruction: Secure with ACE bandage as directed. Compression Wrap Compression Stockings Add-Ons Wound #30 (Lower Leg) Wound Laterality: Left, Anterior Cleanser Soap and Water  Discharge Instruction: May shower and wash wound with dial antibacterial soap and water prior to dressing change. Wound Cleanser Discharge Instruction: Cleanse the wound with wound cleanser prior to applying a clean dressing using gauze sponges, not tissue or cotton balls. Peri-Wound Care Topical Primary Dressing Sorbalgon AG Dressing 2x2 (in/in) Discharge Instruction: Apply to wound bed as instructed Secondary Dressing Zetuvit Plus Silicone Border Dressing 4x4 (in/in) Discharge Instruction: Apply silicone border over primary dressing as directed. Secured With Compression  Wrap CoFlex TLC Zinc Compression Stockings Add-Ons Electronic Signature(s) Signed: 07/26/2022 12:06:40 PM By: Fredirick Maudlin MD FACS Entered By: Fredirick Maudlin on 07/26/2022 12:06:39 [name] ([id number]) [id number] Page 5 of 11 -------------------------------------------------------------------------------- Multi-Disciplinary Care Plan Details Patient Name: Date of Service: [name], [name] 07/26/2022 10:45 A M Medical Record Number: [id number] Patient Account Number: [id number] Date of Birth/Sex: Treating RN: Mar 21, 1951 (72 y.o. [name] Primary Care Ericia Moxley: [name] Other Clinician: Referring Zaron Zwiefelhofer: Treating Vastie Douty/Extender: [name] in Treatment: 58 Multidisciplinary Care Plan reviewed with physician Active Inactive Venous Leg Ulcer Nursing Diagnoses: Knowledge deficit related to disease process and management Potential for venous Insuffiency (use before diagnosis confirmed) Goals: Patient will maintain optimal edema control Date Initiated: 07/27/2021 Target Resolution Date: 02/23/2023 Goal Status: Active Interventions: Assess peripheral edema status every visit. Treatment Activities: Therapeutic compression applied : 07/27/2021 Notes: Wound/Skin Impairment Nursing Diagnoses: Impaired tissue integrity Knowledge deficit related to ulceration/compromised skin integrity Goals: Patient will have a decrease in wound volume by X% from date: (specify in notes) Date Initiated: 04/12/2021 Date Inactivated: 01/04/2022 Target Resolution Date: 04/23/2021 Goal Status: Met Patient/caregiver will verbalize understanding of skin care regimen Date Initiated: 01/04/2022 Target Resolution Date: 02/23/2023 Goal Status: Active Ulcer/skin breakdown will have a volume reduction of 30% by week 4 Date Initiated: 04/12/2021 Date Inactivated: 04/27/2021 Target Resolution Date: 04/27/2021 Goal Status: Unmet Unmet Reason:  infection Ulcer/skin breakdown will have a volume reduction of 50% by week 8 Date Initiated: 04/27/2021 Date Inactivated: 06/29/2021 Target Resolution Date: 06/24/2021 Goal Status: Met Interventions: Assess patient/caregiver ability to obtain necessary supplies Assess patient/caregiver ability to perform ulcer/skin care regimen upon admission and as needed Assess ulceration(s) every visit Notes: Electronic Signature(s) Signed: 07/26/2022 4:48:37 PM By: Blanche East RN Entered By: Blanche East on 07/26/2022 10:53:57 [name] ([id number]) [id number] Page 6 of 11 -------------------------------------------------------------------------------- Pain Assessment Details Patient Name: Date of Service: STAVROS, CAIL 07/26/2022 10:45 A M Medical Record Number: [id number] Patient Account Number: [id number] Date of Birth/Sex: Treating RN: [birth year] (72 y.o. [name] Primary Care Shamina Etheridge: [name] Other Clinician: Referring Torah Pinnock: Treating Drake Landing/Extender: [name] in Treatment: 80 Active Problems Location of Pain Severity and Description of Pain Patient Has Paino No Site Locations Rate the pain. Current Pain Level: 0 Pain Management and Medication Current Pain Management: Electronic Signature(s) Signed: 07/26/2022 4:48:37 PM By: Blanche East RN Entered By: Blanche East on 07/26/2022 10:53:16 -------------------------------------------------------------------------------- Patient/Caregiver Education Details Patient Name: Date of Service: [name], [name] 2/1/2024andnbsp10:45 A M Medical Record Number: [id number] Patient Account Number: [id number] Date of Birth/Gender: Treating RN: 29-Jun-1950 (72 y.o. [name] Primary Care Physician: [name] Other Clinician: Referring Physician: Treating Physician/Extender: [name] in Treatment: 90 Education Assessment Education  Provided To: Patient Education Topics Provided Offloading: Methods: Explain/Verbal Responses: Reinforcements needed, State content correctly [name], [name] ([id number]) [id number] Page 7 of 11 Wound Debridement: Methods: Explain/Verbal Responses: Reinforcements needed, State content correctly Electronic Signature(s) Signed: 07/26/2022 4:48:37 PM  By: Blanche East RN Entered By: Blanche East on 07/26/2022 10:54:21 -------------------------------------------------------------------------------- Wound Assessment Details Patient Name: Date of Service: [name], [name] 07/26/2022 10:45 A M Medical Record Number: [id number] Patient Account Number: [id number] Date of Birth/Sex: Treating RN: Sep 29, 1950 (72 y.o. [name] Primary Care Ceniyah Thorp: [name] Other Clinician: Referring Taj Nevins: Treating Lynnzie Blackson/Extender: [name] in Treatment: 67 Wound Status Wound Number: 18R Primary Diabetic Wound/Ulcer of the Lower Extremity Etiology: Wound Location: Left, Plantar Metatarsal head first Wound Open Wounding Event: Gradually Appeared Status: Date Acquired: 08/23/2020 Comorbid Glaucoma, Sleep Apnea, Hypertension, Peripheral Arterial Disease, Weeks Of Treatment: 67 History: Peripheral Venous Disease, Type II Diabetes, Gout, Osteoarthritis, Clustered Wound: No Neuropathy Photos Wound Measurements Length: (cm) 0.8 Width: (cm) 1.2 Depth: (cm) 0.1 Area: (cm) 0.754 Volume: (cm) 0.075 % Reduction in Area: 94.7% % Reduction in Volume: 97.3% Epithelialization: Small (1-33%) Tunneling: No Undermining: No Wound Description Classification: Grade 2 Wound Margin: Flat and Intact Exudate Amount: Medium Exudate Type: Serosanguineous Exudate Color: red, brown Foul Odor After Cleansing: No Slough/Fibrino Yes Wound Bed Granulation Amount: Small (1-33%) Exposed Structure Granulation Quality: Red Fascia Exposed: No Necrotic Amount:  Large (67-100%) Fat Layer (Subcutaneous Tissue) Exposed: Yes Necrotic Quality: Adherent Slough Tendon Exposed: No Muscle Exposed: No Joint Exposed: No Bone Exposed: No [address] [name], [name] ([id number]) [id number] Page 8 of 11 Texture Color No Abnormalities Noted: No No Abnormalities Noted: Yes Callus: Yes Temperature / Pain Temperature: No Abnormality Moisture No Abnormalities Noted: No Dry / Scaly: Yes Maceration: No Treatment Notes Wound #18R (Metatarsal head first) Wound Laterality: Plantar, Left Cleanser Soap and Water Discharge Instruction: May shower and wash wound with dial antibacterial soap and water prior to dressing change. Wound Cleanser Discharge Instruction: Cleanse the wound with wound cleanser prior to applying a clean dressing using gauze sponges, not tissue or cotton balls. Peri-Wound Care Topical Primary Dressing Sorbalgon AG Dressing 2x2 (in/in) Discharge Instruction: Apply to wound bed as instructed Secondary Dressing Zetuvit Plus Silicone Border Dressing 4x4 (in/in) Discharge Instruction: Apply silicone border over primary dressing as directed. Secured With Compression Wrap Compression Stockings Environmental education officer) Signed: 07/26/2022 4:48:37 PM By: Blanche East RN Entered By: Blanche East on 07/26/2022 11:18:24 -------------------------------------------------------------------------------- Wound Assessment Details Patient Name: Date of Service: [name] Garfinkel. 07/26/2022 10:45 A M Medical Record Number: [id number] Patient Account Number: [id number] Date of Birth/Sex: Treating RN: [birth year] (72 y.o. [name] Primary Care Abhijay Morriss: [name] Other Clinician: Referring Mehlani Blankenburg: Treating Tymeshia Awan/Extender: [name] in Treatment: 67 Wound Status Wound Number: 2 Primary Cyst Etiology: Wound Location: Left, Proximal, Lateral Lower Leg Wound  Open Wounding Event: Bump Status: Date Acquired: 06/03/2021 Comorbid Glaucoma, Sleep Apnea, Hypertension, Peripheral Arterial Disease, Weeks Of Treatment: 59 History: Peripheral Venous Disease, Type II Diabetes, Gout, Osteoarthritis, Clustered Wound: Yes Neuropathy Photos [name], [name] ([id number]) [id number] Page 9 of 11 Wound Measurements Length: (cm) Width: (cm) Depth: (cm) Clustered Quantity: Area: (cm) Volume: (cm) 3 % Reduction in Area: 14.3% 0.6 % Reduction in Volume: 89.3% 0.1 Epithelialization: Medium (34-66%) 3 Tunneling: No 1.414 Undermining: No 0.141 Wound Description Classification: Full Thickness With Exposed Support Structures Wound Margin: Fibrotic scar, thickened scar Exudate Amount: Medium Exudate Type: Serosanguineous Exudate Color: red, brown Foul Odor After Cleansing: No Slough/Fibrino Yes Wound Bed Granulation Amount: Large (67-100%) Exposed Structure Granulation Quality: Red, Pink Fascia Exposed: No Necrotic Amount: Small (1-33%) Fat Layer (Subcutaneous Tissue) Exposed: Yes Necrotic Quality: Eschar, Adherent Slough Tendon Exposed: No Muscle  Exposed: No Joint Exposed: No Bone Exposed: No Periwound Skin Texture Texture Color No Abnormalities Noted: No No Abnormalities Noted: No Scarring: Yes Hemosiderin Staining: Yes Moisture Temperature / Pain No Abnormalities Noted: No Temperature: No Abnormality Dry / Scaly: Yes Tenderness on Palpation: Yes Treatment Notes Wound #22 (Lower Leg) Wound Laterality: Left, Lateral, Proximal Cleanser Soap and Water Discharge Instruction: May shower and wash wound with dial antibacterial soap and water prior to dressing change. Wound Cleanser Discharge Instruction: Cleanse the wound with wound cleanser prior to applying a clean dressing using gauze sponges, not tissue or cotton balls. Peri-Wound Care Sween Lotion (Moisturizing lotion) Discharge Instruction: Apply moisturizing  lotion to the leg Topical Skintegrity Hydrogel 4 (oz) Discharge Instruction: Apply hydrogel as directed Primary Dressing Endoform 2x2 in Discharge Instruction: Moisten with Hydrogel or saline Secondary Dressing ABD Pad, 8x10 Discharge Instruction: Apply over primary dressing as directed. Secured With Elastic Bandage 4 inch (ACE bandage) [name], [name] ([id number]) [id number] Page 10 of 11 Discharge Instruction: Secure with ACE bandage as directed. Compression Wrap Compression Stockings Add-Ons Electronic Signature(s) Signed: 07/26/2022 4:48:37 PM By: Blanche East RN Entered By: Blanche East on 07/26/2022 11:19:00 -------------------------------------------------------------------------------- Wound Assessment Details Patient Name: Date of Service: [name] Garfinkel. 07/26/2022 10:45 A M Medical Record Number: [id number] Patient Account Number: [id number] Date of Birth/Sex: Treating RN: 10/31/1950 (71 y.o. [name] Primary Care Akeisha Lagerquist: [name] Other Clinician: Referring Kennard Fildes: Treating Keaja Reaume/Extender: [name] in Treatment: 67 Wound Status Wound Number: 30 Primary Abrasion Etiology: Wound Location: Left, Anterior Lower Leg Wound Open Wounding Event: Shear/Friction Status: Date Acquired: 06/21/2022 Comorbid Glaucoma, Sleep Apnea, Hypertension, Peripheral Arterial Disease, Weeks Of Treatment: 5 History: Peripheral Venous Disease, Type II Diabetes, Gout, Osteoarthritis, Clustered Wound: No Neuropathy Photos Wound Measurements Length: (cm) 1.6 Width: (cm) 0.6 Depth: (cm) 0.1 Area: (cm) 0.754 Volume: (cm) 0.075 % Reduction in Area: -91.9% % Reduction in Volume: -92.3% Epithelialization: Large (67-100%) Tunneling: No Undermining: No Wound Description Classification: Full Thickness Without Exposed Suppor Wound Margin: Distinct, outline attached Exudate Amount: Medium Exudate Type: Serous Exudate  Color: amber t Structures Foul Odor After Cleansing: No Slough/Fibrino Yes Wound Bed Granulation Amount: Medium (34-66%) Exposed Structure Granulation Quality: Red Fascia Exposed: No Necrotic Amount: Medium (34-66%) Fat Layer (Subcutaneous Tissue) Exposed: Yes Necrotic Quality: Adherent Slough Tendon Exposed: No Muscle Exposed: No Joint Exposed: No Bone Exposed: No [name] ([id number]) [id number] Page 11 of 11 Periwound Skin Texture Texture Color No Abnormalities Noted: Yes No Abnormalities Noted: No Hemosiderin Staining: Yes Moisture No Abnormalities Noted: No Temperature / Pain Maceration: Yes Temperature: No Abnormality Treatment Notes Wound #30 (Lower Leg) Wound Laterality: Left, Anterior Cleanser Soap and Water Discharge Instruction: May shower and wash wound with dial antibacterial soap and water prior to dressing change. Wound Cleanser Discharge Instruction: Cleanse the wound with wound cleanser prior to applying a clean dressing using gauze sponges, not tissue or cotton balls. Peri-Wound Care Topical Primary Dressing Sorbalgon AG Dressing 2x2 (in/in) Discharge Instruction: Apply to wound bed as instructed Secondary Dressing Zetuvit Plus Silicone Border Dressing 4x4 (in/in) Discharge Instruction: Apply silicone border over primary dressing as directed. Secured With Compression Wrap CoFlex TLC Zinc Compression Stockings Add-Ons Electronic Signature(s) Signed: 07/26/2022 4:48:37 PM By: Blanche East RN Entered By: Blanche East on 07/26/2022 11:19:52 -------------------------------------------------------------------------------- Vitals Details Patient Name: Date of Service: [name] Garfinkel. 07/26/2022 10:45 A M Medical Record Number: [id number] Patient Account Number: [id number] Date of Birth/Sex: Treating RN: 13-Nov-1950 (72 y.o. M) [name],  [name] Primary Care Kennadi Albany: [name] Other Clinician: Referring Spruha Weight: Treating  Lennart Gladish/Extender: [name] in Treatment: 47 Vital Signs Time Taken: 10:53 Respiratory Rate (breaths/min): 18 Height (in): 74 Capillary Blood Glucose (mg/dl): 121 Weight (lbs): 238 Reference Range: 80 - 120 mg / dl Body Mass Index (BMI): 30.6 Electronic Signature(s) Signed: 07/26/2022 4:48:37 PM By: Blanche East RN Entered By: Blanche East on 07/26/2022 10:54:47

## 2022-07-26 NOTE — Progress Notes (Signed)
[name], [name] ([id number]) [id number] Page 1 of 20 Visit Report for 07/26/2022 Chief Complaint Document Details Patient Name: Date of Service: [name], [name] 07/26/2022 10:45 A M Medical Record Number: [id number] Patient Account Number: [id number] Date of Birth/Sex: Treating RN: 05-04-1951 (72 y.o. M) Primary Care Provider: Jilda [name] Other Clinician: Referring Provider: Treating Provider/Extender: [name] in Treatment: 67 Information Obtained from: Patient Chief Complaint Left leg and foot ulcers 04/12/2021; patient is here for wounds on his left lower leg and left plantar foot over the first metatarsal head Electronic Signature(s) Signed: 07/26/2022 12:07:06 PM By: [name] Maudlin MD FACS Entered By: [name] on 07/26/2022 12:07:06 -------------------------------------------------------------------------------- Debridement Details Patient Name: Date of Service: [name]. 07/26/2022 10:45 A M Medical Record Number: [id number] Patient Account Number: [id number] Date of Birth/Sex: Treating RN: 12-22-1950 (72 y.o. [name] Primary Care Provider: Jilda [name] Other Clinician: Referring Provider: Treating Provider/Extender: [name] in Treatment: 67 Debridement Performed for Assessment: Wound #22 Left,Proximal,Lateral Lower Leg Performed By: Physician [name] Maudlin, MD Debridement Type: Debridement Level of Consciousness (Pre-procedure): Awake and Alert Pre-procedure Verification/Time Out Yes - 11:26 Taken: Start Time: 11:27 Pain Control: Lidocaine 4% T opical Solution T Area Debrided (L x W): otal 3 (cm) x 0.6 (cm) = 1.8 (cm) Tissue and other material debrided: Non-Viable, Slough, Slough Level: Non-Viable Tissue Debridement Description: Selective/Open Wound Instrument: Curette Bleeding: Minimum Hemostasis Achieved: Pressure Response to Treatment: Procedure was tolerated  well Level of Consciousness (Post- Awake and Alert procedure): Post Debridement Measurements of Total Wound Length: (cm) 3 Width: (cm) 0.6 Depth: (cm) 0.1 Volume: (cm) 0.141 Character of Wound/Ulcer Post Debridement: Requires Further Debridement Post Procedure Diagnosis Same as [name] ([id number]) [id number] Page 2 of 20 Notes Scribed for Dr. Celine [name] by [name] East, RN Electronic Signature(s) Signed: 07/26/2022 12:21:08 PM By: [name] Maudlin MD FACS Signed: 07/26/2022 4:48:37 PM By: [name] East RN Entered By: [name] on 07/26/2022 11:28:39 -------------------------------------------------------------------------------- HPI Details Patient Name: Date of Service: [name]. 07/26/2022 10:45 A M Medical Record Number: [id number] Patient Account Number: [id number] Date of Birth/Sex: Treating RN: 11-May-1951 (72 y.o. M) Primary Care Provider: Jilda [name] Other Clinician: Referring Provider: Treating Provider/Extender: [name] in Treatment: 66 History of Present Illness HPI Description: 10/11/17; [name] is a 72 year old man who tells me that in 2015 he slipped down the latter traumatizing his left leg. He developed a wound in the same spot the area that we are currently looking at. He states this closed over for the most part although he always felt it was somewhat unstable. In 2016 he hit the same area with the door of his car had this reopened. He tells me that this is never really closed although sometimes an inflow it remains open on a constant basis. He has not been using any specific dressing to this except for topical antibiotics the nature of which were not really sure. His primary doctor did send him to see Dr. Einar [name] of interventional cardiology. He underwent an angiogram on 08/06/17 and he underwent a PTA and directional atherectomy of the lesser distal SFA and popliteal arteries which  resulted in brisk improvement in blood flow. It was noted that he had 2 vessel runoff through the anterior tibial and peroneal. He is also been to see vascular and interventional radiologist. He was not felt to have any significant superficial venous insufficiency. Presumably is not a candidate for any ablation. It  was suggested he come here for wound care. The patient is a type II diabetic on insulin. He also has a history of venous insufficiency. ABIs on the left were noncompressible in our clinic 10/21/17; patient we admitted to the clinic last week. 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 We put Iodosorb on him after an aggressive debridement and 3 layer compression. He complained of pain in his ankle and itching with is skin in fact he scratched the area on the medial calf superiorly at the rim of our wraps and he has 2 small open areas in that location today which are new. I changed his primary dressing today to silver collagen. As noted he is already had revascularization and does not have any significant superficial venous insufficiency that would be amenable to ablation 10/28/17; patient admitted to the clinic 2 weeks ago. He has a smaller Wound. Scratch injury from last week revealed. There is large wound over the tibial area. This is smaller. Granulation looks healthy. No need for debridement. 11/04/17; the wound on the left lateral calf looks better. Improved dimensions. Surface of this looks better. We've been maintaining him and Kerlix Coban wraps. He finds this much more comfortable. Silver collagen dressing 11/11/17; left lateral Wound continues to look healthy be making progress. Using a #5 curet I removed removed nonviable skin from the surface of the wound and then necrotic debris from the wound surface. Surface of the wound continues to look healthy. He also has an open area on the left great toenail bed. We've been  using topical antibiotics. 11/19/17; left anterior lateral wound continues to look healthy but it's not closed. He also had a small wound above this on the left leg Initially traumatic wounds in the setting of significant chronic venous insufficiency and stasis dermatitis 11/25/17; left anterior wounds superiorly is closed still a small wound inferiorly. 12/02/17; left anterior tibial area. Arrives today with adherent callus. Post debridement clearly not completely closed. Hydrofera Blue under 3 layer compression. 12/09/17; left anterior tibia. Circumferential eschar however the wound bed looks stable to improved. We've been using Hydrofera Blue under 3 layer compression 12/17/17; left anterior tibia. Apparently this was felt to be closed however when the wrap was taken off there is a skin tear to reopen wounds in the same area we've been using Hydrofera Blue under 3 layer compression 12/23/17 left anterior tibia. Not close to close this week apparently the [facility] was stuck to this again. Still circumferential eschar requiring debridement. I put a contact layer on this this time under the Hydrofera Blue 12/31/17; left anterior tibia. Wound is better slight amount of hyper-granulation. Using Hydrofera Blue over Adaptic. 01/07/18; left anterior tibia. The wound had some surface eschar however after this was removed he has no open wound.he was already revascularized by Dr. Einar [name] when he came to our clinic with atherectomy of the left SFA and popliteal artery. He was also sent to interventional radiology for venous reflux studies. He was not felt to have significant reflux but certainly has chronic venous changes of his skin with hemosiderin deposition around this area. He will definitely need to lubricate his skin and wear compression stocking and I've talked to him about this. READMISSION 05/26/2018 This is a now 72 year old man we cared for with traumatic wounds on his left anterior lower extremity. He  had been previously revascularized during that admission by Dr. Einar [name]. Apparently in follow-up Dr. Einar [name]  noted that he had deterioration in his arterial status. He underwent a stent placement in the distal left SFA on 04/22/2018. 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 He went back to the angiography suite on 04/30/2018 he underwent PTA and balloon angioplasty of the occluded left mid anterior tibial artery, thrombotic occlusion went from 100 to 0% which reconstitutes the posterior tibial artery. He had thrombectomy and aspiration of the peroneal artery. The stent placed in the distal SFA left SFA was still occluded. 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 He has been recuperating at [name] in [facility] after the most recent hospitalization. He is going home tomorrow. He tells me that 2 weeks ago he traumatized the tip of his left fifth toe. He came in urgently for our review of this. This was a history of before I noted that Dr. Einar [name] had already noted dry gangrenous changes of the left fifth toe [name], [name] ([id number]) [id number] Page 3 of 20 06/09/2018; 2-week follow-up. I did contact Dr. Einar [name] after his last appointment and he apparently saw 1 of Dr. Irven Shelling colleagues the next day. He does not follow-up with Dr. Einar [name] himself until Thursday of this week. 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 Nevertheless there is no evidence of infection no drainage and no pain. He had a new area that this week when we were signing him in today on the left anterior mid tibia area, this is in close proximity to the previous wound we have dealt with in this clinic. 06/23/2018;  2-week follow-up. I did not receive a recent note from Dr. Einar [name] to review today. Our office is trying to obtain this. He is apparently not planning to do further vascular interventions and wondered about compression to try and help with the patient's chronic venous insufficiency. However we are also concerned about the arterial flow. He arrives in clinic today with a new area on the left third toe. The areas on the calf/anterior tibia are close to closing. The left fifth toe is still mummified using Betadine. -In reviewing things with the patient he has what sounds like claudication with mild to moderate amount of activity. 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 I prescribed Levaquin over the phone while we attempted to arrange a plan of care. However the patient called yesterday to report he had low-grade fever and he came in today acutely. There is been a marked deterioration in the left third toe with spreading cellulitis up into the dorsal left foot. He was referred to the emergency room. Readmission: 06/29/2020 patient presents today for reevaluation here in our clinic he was previously treated by Dr. Dellia Nims at the latter part of 2019 in 2 the beginning of 2020. Subsequently we have not seen him since that time in the interim he did have evaluation with vein and vascular specialist specifically Dr. Anice Paganini who did perform quite extensive work for a left femoral to anterior tibial artery bypass. With that being said in the interim the patient has developed significant lymphedema and has wounds that he tells me have really never healed in regard to the incision site on the left leg. He  also has multiple wounds on the feet for various reasons some of which is that he tends to pick at his feet. Fortunately there is no signs of active infection systemically at this time he does have some wounds that are little bit deeper but most are fairly superficial he seems  to have good blood flow and overall everything appears to be healthy I see no bone exposed and no obvious signs of osteomyelitis. I do not know that he necessarily needs a x-ray at this point although that something we could consider depending on how things progress. The patient does have a history of lymphedema, diabetes, this is type II, chronic kidney disease stage III, hypertension, and history of peripheral vascular disease. 07/05/2020; patient admitted last week. Is a patient I remember from 2019 he had a spreading infection involving the left foot and we sent him to the hospital. 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 He subsequently had a left femoral to anterior tibial bypass by Dr.Cain vein and vascular. He also has severe lymphedema with chronic skin changes related to that on the left leg. The most problematic area that was new today was on the left medial great toe. This was apparently a small area last week there was purulent drainage which our intake nurse cultured. Also areas on the left medial foot and heel left lateral foot. He has 2 areas on the left medial calf left lateral calf in the setting of the severe lymphedema. 07/13/2020 on evaluation today patient appears to be doing better in my opinion compared to his last visit. The good news is there is no signs of active infection systemically and locally I do not see any signs of infection either. He did have an x-ray which was negative that is great news he had a culture which showed MRSA but at the same time he is been on the doxycycline which has helped. I do think we may want to extend this for 7 additional days 1/25; patient admitted to the clinic a few weeks ago. 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 We have been putting him under compression his edema control is a lot better but he is severe verricused skin on the left leg. He is really  done quite well he still has an open area on the left medial calf and the left medial first metatarsal head. We have been using silver collagen on the leg silver alginate on the foot 07/27/2020 upon evaluation today patient appears to be doing decently well in regard to his wounds. He still has a lot of dry skin on the left leg. Some of this is starting to peel back and I think he may be able to have them out by removing some that today. Fortunately there is no signs of active infection at this time on the left leg although on the right leg he does appear to have swelling and erythema as well as some mild warmth to touch. This does have been concerned about the possibility of cellulitis although within the differential diagnosis I do think that potentially a DVT has to be at least considered. We need to rule that out before proceeding would just call in the cellulitis. Especially since he is having pain in the posterior aspect of his calf muscle. 2/8; the patient had seen sparingly. He has severe skin changes of chronic lymphedema in the left leg thickened hyperkeratotic  verrucous skin. He has an open wound on the medial part of the left first met head left mid tibia. He also has a rim of nonepithelialized skin in the anterior mid tibia. He brought in the AmLactin lotion that was been prescribed although I am not sure under compression and its utility. There concern about cellulitis on the right lower leg the last time he was here. He was put on on antibiotics. His DVT rule out was negative. The right leg looks fine he is using his stocking on this area 08/10/2020 upon evaluation today patient appears to be doing well with regard to his leg currently. He has been tolerating the dressing changes without complication. Fortunately there is no signs of active infection which is great news. Overall very pleased with where things stand. 2/22; the patient still has an area on the medial part of the left first  met his head. This looks better than when I last saw this earlier this month he has a rim of epithelialization but still some surface debris. Mostly everything on the left leg is healed. There is still a vulnerable in the left mid tibia area. 08/30/2020 upon evaluation today patient appears to be doing much better in regard to his wounds on his foot. 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 Nonetheless I think we will address that today I Minna send in a prescription for him in that regard. Overall though there does not appear to be any signs of significant worsening. 09/07/2020 on evaluation today patient's wounds over his left foot appear to be doing excellent. I do not see any signs of infection there is some callus buildup this can require debridement for certain but overall I feel like he is managing quite nicely. He still using the AmLactin cream which has been beneficial for him as well. 3/22; left foot wound is closed. There is no open area here. He is using ammonium lactate lotion to the lower extremities to help exfoliate dry cracked skin. He has compression stockings from elastic therapy in [place]. The wound on the medial part of his left first met head is healed today. READMISSION 04/12/2021 Mr. Prigmore is a patient we know fairly well he had a prolonged stay in clinic in 2019 with wounds on his left lateral and left anterior lower extremity in the setting of chronic venous insufficiency. More recently he was here earlier this year with predominantly an area on his left foot first metatarsal head plantar and he says the plantar foot broke down on its not long after we discharged him but he did not come back here. The last few months areas of broken down on his left anterior and again the left lateral lower extremity. The leg itself is very swollen  chronically enlarged a lot of hyperkeratotic dry Berry Q skin in the left lower leg. His edema extends well into the thigh. He was seen by Dr. Donzetta Matters. He had ABIs on 03/02/2021 showing an ABI on the right of 1 with a TBI of 0.72 his ABI in the left at 1.09 TBI of 0.99. Monophasic and biphasic waveforms on the right. On the left monophasic waveforms were noted he went on to have an angiogram on 03/27/2021 this showed the aortic aortic and iliac segments were free of flow-limiting stenosis the left common femoral vein to evaluate the left femoral to  anterior tibial artery bypass was unobstructed the bypass was patent without any areas of stenosis. We discharged the patient in bilateral juxta lite stockings but very clearly that was not sufficient to control the swelling and maintain skin integrity. He is clearly going to need compression pumps. 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 This is fortunate because he is on his feet for long periods of time. 10/27; patient comes in with our intake nurse reporting copious amount of green drainage from the left anterior mid tibia the left dorsal foot and to a lesser extent the left medial mid tibia. We left the compression wrap on all week for the amount of edema in his left leg is quite a bit better. We use silver alginate as the primary dressing 11/3; edema control is good. Left anterior lower leg left medial lower leg and the plantar first metatarsal head. The left anterior lower leg required debridement. Deep tissue culture I did of this wound showed MRSA I put him on 10 days of doxycycline which she will start today. We have him in compression wraps. He [name], [name] ([id number]) [id number] Page 4 of 20 has a security card and [employer] however he is retiring on November 15. We will need to then get him into a better offloading boot for the left foot perhaps a total contact  cast 11/10; edema control is quite good. Left anterior and left medial lower leg wounds in the setting of chronic venous insufficiency and lymphedema. He also has a substantial area over the left plantar first metatarsal head. 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 He has significant hypergranulation on the left plantar foot wound. The patient is a diabetic but he does not have significant PAD 11/17; edema control is quite good. Left anterior and left medial lower leg wounds look better. The really concerning area remains the area on the left plantar first metatarsal head. He has a rim of epithelialization. 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 Starting today with a forefoot off loader but . possibly a total contact cast. 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 Arrives with a very significant odor from I think all of the wounds on the left leg and the left foot. Because of this I did not put a total contact cast on him today but will could still consider this. His wife was having cataract surgery which is the reason he missed the appointment 12/6. I saw this man 5 days ago with a swelling below the popliteal fossa. I thought he actually might have a Baker's cyst however the DVT rule out study that we could arrange right away was negative the technician told me this was not a ruptured Baker's cyst. We attempted to get this aspirated by under ultrasound guidance in interventional radiology however all they did was an ultrasound however it shows an extensive fluid collection 62 x 8 x 9.4 in the left thigh and left calf. The patient states he thinks this started 8 days ago or so but  he really is not  complaining of any pain, fever or systemic symptoms. He has not ha 12/20; after some difficulty I managed to get the patient into see Dr. Donzetta Matters. Eventually he was taken into the hospital and had a drain put in the fluid collection below his left knee posteriorly extending into the posterior thigh. He still has the drain in place. Culture of this showed moderate staff aureus few Morganella and few Klebsiella he is now on doxycycline and ciprofloxacin as suggested by infectious disease he is on this for a month. The drain will remain in place until it stops draining 12/29; he comes in today with the 1 wound on his left leg and the area on the left plantar first met head significantly smaller. Both look healthy. He still has the drain in the left leg. He says he has to change this daily. Follows up with Dr. Donzetta Matters on January 11. 06/29/2021; the wounds that I am following on the left leg and left first met head continued to be quite healthy. However the area where his inferior drain is in place had copious amounts of drainage which was green in color. The wound here is larger. Follows up with Dr. Gwenlyn Saran of vein and vascular his surgeon next week as well as infectious disease. He remains on ciprofloxacin and doxycycline. He is not complaining of excessive pain in either one of the drain areas 1/12; the patient saw vascular surgery and infectious disease. Vascular surgery has left the drain in place as there was still some notable drainage still see him back in 2 weeks. Dr. Velna Ochs stop the doxycycline and ciprofloxacin and I do not believe he follows up with them at this point. Culture I did last week showed both doxycycline resistant MRSA and Pseudomonas not sensitive to ciprofloxacin although only in rare titers 1/19; the patient's wound on the left anterior lower leg is just about healed. We have continued healing of the area that was medially on the left leg. Left first plantar metatarsal head  continues to get smaller. The major problem here is his 2 drain sites 1 on the left upper calf and lateral thigh. There is purulent drainage still from the left lateral thigh. I gave him antibiotics last week but we still have recultured. He has the drain in the area I think this is eventually going to have to come out. I suspect there will be a connecting wound to heal here perhaps with improved VAc 1/26; the patient had his drain removed by vein and vascular on 1/25/. This was a large pocket of fluid in his left thigh that seem to tunnel into his left upper calf. 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 His mention his Penrose drain was removed today. He now has a tunneling wound on his left calf and left thigh. Both of these probe widely towards each other although I cannot really prove that they connect. Both wounds on his lower leg anteriorly are closed and his area over the first metatarsal head on his right foot continues to improve. We are using Hydrofera Blue here. He also saw infectious disease culture of the abscess they noted was polymicrobial with MRSA, Morganella and Klebsiella he was treated with doxycycline and ciprofloxacin for 4 weeks ending on 07/03/2021. They did not recommend any further antibiotics. Notable that while he still had the Penrose drain in place last week he had purulent drainage coming out of the inferior IandD site this grew [facility], MRSA and Pseudomonas but  there does not appear to be any active infection in this area today with the drain out and he is not systemically unwell 2/2; with regards to the drain sites the superior one on the thigh actually is closed down the one on the upper left lateral calf measures about 8 and half centimeters which is an improvement seems to be less prominent although still with a lot of drainage. The only remaining wound is over the first metatarsal head on the left foot and this looks to be continuing to  improve with Hydrofera Blue. 2/9; the area on his plantar left foot continues to contract. Callus around the wound edge. The drain sites specifically have not come down in depth. We put the wound VAC on Monday he changed the canister late last night our intake nurse reported a pocket of fluid perhaps caused by our compression wraps 2/16; continued improvement in left foot plantar wound. drainage site in the calf is not improved in terms of depth (wound vac) 2/23; continued improvement in the left foot wound over the first metatarsal head. With regards to the drain sites the area on his thigh laterally is healed however the open area on his calf is small in terms of circumference by still probes in by about 15 cm. Within using the wound VAC. Hydrofera Blue on his foot 08/24/2021: The left first metatarsal head wound continues to improve. The wound bed is healthy with just some surrounding callus. Unfortunately the open drain site on his calf remains open and tunnels at least 15 cm (the extent of a Q-tip). This is despite several weeks of wound VAC treatment. Based on reading back through the notes, there has been really no significant change in the depth of the wound, although the orifice is smaller and the more cranial wound on his thigh has closed. I suspect the tunnel tracks nearly all the way to this location. 08/31/2021: Continued improvement in the left first metatarsal head wound. There has been absolutely no improvement to the long tunnel from his open drain site on his calf. We have tried to get him into see vascular surgery sooner to consider the possibility of simply filleting the tract open and allowing it to heal from the bottom up, likely with a wound VAC. They have not yet scheduled a sooner appointment than his current mid April 09/14/2021: He was seen by vascular surgery and they took him to the operating room last week. They opened a portion of the tunnel, but did not extend the entire  length of the known open subcutaneous tract. I read Dr. Claretha Cooper operative note and it is not clear from that documentation why only a portion of the tract was opened. The heaped up granulation tissue was curetted and removed from at least some portion of the tract. They did place a wound VAC and applied an Unna boot to the leg. The ulcer on his left first metatarsal head is smaller today. The bed looks good and there is just a small amount of surrounding callus. 09/21/2021: The ulcer on his left first metatarsal head looks to be stalled. There is some callus surrounding the wound but the wound bed itself does not appear particularly dynamic. The tunnel tract on his lateral left leg seems to be roughly the same length or perhaps slightly smaller but the wound bed appears healthy with good granulation tissue. He opened up a new wound on his medial thigh and the site of a prior surgical incision. He says that  he did this unconsciously in his sleep by scratching. 09/28/2021: Unfortunately, the ulcer on his left first metatarsal head has extended underneath the callus toward the dorsum of his foot. The medial thigh wounds are roughly the same. The tunnel on his lateral left leg continues to be problematic; it is longer than we are able to actually probe with a Q-tip. I am still not certain as to why Dr. Donzetta Matters did not open this up entirely when he took the patient to the operating room. We will likely be back in the same situation with just a small superficial opening in a long unhealed tract, as the open portion is granulating in nicely. [name], [name] ([id number]) [id number] Page 5 of 20 10/02/2021: The patient was initially scheduled for a nurse visit, but we are also applying a total contact cast today. The plantar foot wound looks clean without significant accumulated callus. We have been applying Prisma silver collagen to the site. 10/05/2021: The patient is here for his first  total contact cast change. We have tried using gauze packing strips in the tunnel on his lateral leg wound, but this does not seem to be working any better than the white VAC foam. The foot ulcer looks about the same with minimal periwound callus. Medial thigh wound is clean with just some overlying eschar. 10/12/2021: The plantar foot wound is stable without any significant accumulation of periwound callus. The surface is viable with good granulation tissue. The medial thigh wounds are much smaller and are epithelializing. On the other hand, he had purulent drainage coming from the tunnel on his lateral leg. He does go back to see Dr. Donzetta Matters next week and is planning to ask him why the wound tunnel was not completely opened at the time of his most recent operation. 10/19/2021: The plantar foot wound is markedly improved and has epithelial tissue coming through the surface. The medial thigh wounds are nearly closed with just a tiny open area. He did see Dr. Donzetta Matters earlier this week and apparently they did discuss the possibility of opening the sinus tract further and enabling a wound VAC application. Apparently there are some limits as to what Dr. 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 I think if we could get the tract open to the level of the popliteal fossa, this would greatly aid in her ability to get this chart closed. That being said, however, today when I probed the tract with a Q-tip, I was not able to insert the entirety of the Q-tip as I have on previous occasions. The tunnel is shorter by about 4 cm. The surface is clean with good granulation tissue and no further episodes of purulent drainage. 10/30/2021: Last week, the patient underwent surgery and had the long tract in his leg opened. There was a rind that was debrided, according to the operative report. His medial thigh ulcers are closed. The plantar foot wound is clean with a good surface and some built up  surrounding callus. 11/06/2021: The overall dimensions of the large wound on his lateral leg remain about the same, but there is good granulation tissue present and the tunneling is a little bit shorter. He has a new wound on his anterior tibial surface, in the same location where he had a similar lesion in the past. The plantar foot wound is clean with some buildup surrounding callus. Just toward the medial aspect of his foot, however, there is an area of darkening that once debrided, revealed  another opening in the skin surface. 11/13/2021: The anterior tibial surface wound is closed. The plantar foot wound has some surrounding callus buildup. The area of darkening that I debrided last week and revealed an opening in the skin surface has closed again. The tunnel in the large wound on his lateral leg has come in by about 3 cm. There is healthy granulation tissue on the entire wound surface. 11/23/2021: The patient was out of town last week and did wet-to-dry dressings on his large wound. 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 Unfortunately, he picked open the wound on his left medial thigh. He says that it was itching and he just could not stop scratching it until it was open again. The wound on his plantar foot is smaller and has not accumulated a tremendous amount of callus. The lateral leg wound is shallower and the tunnel has also decreased in depth. There is just a little bit of slough accumulation on the surface. 11/30/2021: Another portion of his left medial thigh has been opened up. All of these wounds are fairly superficial with just a little bit of slough and eschar accumulation. The wound on his plantar foot is almost closed with just a bit of eschar and periwound callus accumulation. The lateral leg wound is nearly flush with the surrounding skin and the tunnel is markedly shallower. 12/07/2021: There is just 1 open area on  his left medial thigh. It is clean with just a little bit of perimeter eschar. The wound on his plantar foot continues to contract and just has some eschar and periwound callus accumulation. The lateral leg wound is closing at the more distal aspect and the tunnel is smaller. The surface is nearly flush with the surrounding skin and it has a good bed of granulation tissue. 12/14/2021: The thigh and foot wounds are closed. The lateral leg wound has closed over approximately half of its length. The tunnel continues to contract and the surface is now flush with the surrounding skin. The wound bed has robust granulation tissue. 12/22/2021: The thigh and foot wounds have reopened. The foot wound has a lot of callus accumulation around and over it. The thigh wound is tiny with just a little bit of slough in the wound bed. The lateral leg wound continues to contract. His vascular surgeon took the wound VAC off earlier in the week and the patient has been doing wet-to-dry dressings. There is a little slough accumulation on the surface. The tunnel is about 3 cm in depth at this point. 12/28/2021: The thigh wound is closed again. The foot wound has some callus that subsequently has peeled back exposing just a small slit of a wound. The lateral leg wound Is down to about half the size that it originally was and the tunnel is down to about half a centimeter in depth. 01/04/2022: The thigh wound remains closed. The foot wound has heavy callus overlying the wound site. Once this was debrided, the wound was found to be closed. The lateral leg wound is smaller again this week and very superficial. No tunnel could be identified. 01/12/2022: The thigh and foot wounds both remain closed. The lateral leg wound is now nearly flush with the skin surface. There is good granulation tissue present with a light layer of slough. 01/19/2022: Due to the way his wrap was placed, the patient did not change the dressing on his thigh at all  and so  the foam was saturated and his skin is macerated. There is a light layer of slough on the wound surface. The underlying granulation tissue is robust and healthy-appearing. He has heavy callus buildup at the site of his first metatarsal head wound which is still healed. 02/01/2022: He has been in silver alginate. When he removed the dressing from his thigh wound, however, some leg, superficially reopening a portion of the wound that had healed. In addition, underneath the callus at his left first metatarsal head, there appears to be a blister and the wound appears to be open again. 02/08/2022: The lateral leg wound has contracted substantially. There is eschar and a light layer of slough present. He says that it is starting to pull and is uncomfortable. On inspection, there is some puckering of the scar and the eschar is quite dry; this may account for his symptoms. On his first metatarsal head, the wound is much smaller with just some eschar on the surface. The callus has not reaccumulated. He reports that he had a blister come up on his medial thigh wound at the distal aspect. It popped and there is now an opening in his skin again. Looking back through his [place] of wound photos, there is what looks like a permanent suture just deep to this location and it may be trying to erode through. We have been using silver alginate on his wounds. 02/15/2022: The lateral leg wound is about half the size it was last week. It is clean with just a little perimeter eschar and light slough. The wound on his first metatarsal head is about the same with heavy callus overlying it. The medial thigh wound is closed again. He does have some skin changes on the top of his foot that looks potentially yeast related. 02/22/2022: The skin on the top of his foot improved with the use of a topical antifungal. The lateral leg wound continues to contract and is again smaller this week. There is a little bit of slough and  eschar on the surface. The first metatarsal head wound is a little bit smaller but has reaccumulated a thick callus over the top. He decided to try to trim his toenail and ultimately took the entire nail off of his left great toe. 03/02/2022: His lateral leg wound continues to improve, as does the wound on his left great toe. Unfortunately, it appears that somehow his foot got wet and moisture seeped in through the opening causing his skin to lift. There is a large wound now overlying his first metatarsal on both the plantar, medial, and dorsal portion of his foot. There is necrotic tissue and slough present underneath the shaggy macerated skin. 03/08/2022: The lateral leg wound is smaller again today. There is just a light layer of slough and eschar on the surface. The great toe wound is smaller again today. The first metatarsal wound is a little bit smaller today and does not look nearly as necrotic and macerated. There is still slough and nonviable tissue present. 03/15/2022: The lateral leg wound is narrower and just has a little bit of light slough buildup. The first metatarsal wound still has a fair amount of moisture affecting the periwound skin. The great toe wound is healed. 03/22/2022: The lateral leg wound is now isolated to just at the level of his knee. There is some eschar and slough accumulation. The first metatarsal head COSTA, JHA ([id number]) [id number] Page 6 of 20 wound has epithelialized tremendously and is about half the size  that it was last week. He still has some maceration on the top of his foot and a fungal odor is present. 03/29/2022: T oday the patient's foot was macerated, suggesting that the cast got wet. The patient has also been picking at his dry skin and has enlarged the wound on his left lateral leg. In the time between having his cast removed and my evaluation, he had picked more dry skin and opened up additional wounds on his Achilles  area and dorsal foot. The plantar first metatarsal head wound, however, is smaller and clean with just macerated callus around the perimeter and light slough on the surface. The lateral leg wound measured a little bit larger but is also fairly clean with eschar and minimal slough. 04/02/2022: The patient had vascular studies done last Friday and so his cast was not applied. He is here today to have that done. 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 04/05/2022: Both wounds are smaller and quite clean. There is just a little biofilm on the lateral leg wound. 10/20; the patient has a wound on the left lateral surgical incision at the level of his lateral knee this looks clean and improved. He is using silver alginate. He also has an area on his left medial foot for which she is using Hydrofera Blue under a total contact cast both wounds are measuring smaller 04/20/2022: The plantar foot wound has contracted considerably and is very close to closing. The lateral leg wound was measured a little larger, but there was a tiny open area that was included in the measurements that was not included last week. He has some eschar around the perimeter but otherwise the wound looks clean. 04/27/2022: The lateral leg wound looks better this week. He says that midweek, he felt it was very dry and began applying hydrogel to the site. I think this was beneficial. The foot wound is nearly closed underneath a thick layer of dry skin and callus. 05/04/2022: The foot wound is healed. He has developed a new small ulcer on his anterior tibial surface about midway up his leg. It has a little slough on the surface. The lateral leg wound still is fairly dry, but clean with just a little biofilm on the surface. 05/11/2022: The wound on his foot reopened on Wednesday. A large blister formed which then broke open revealing the fat layer underneath. The ulcer on his anterior tibial surface is a little bit larger this  week. The lateral leg wound has much better moisture balance this week. Fortunately, prior to his foot wound reopening, he did get the cast made for his orthotic. 05/15/2022: Already, the left medial foot wound has improved. The tissue is less macerated and the surface is clean. The ulcer on his anterior tibial surface continues to enlarge. This seems likely secondary to accumulated moisture. The lateral leg wound continues to have an improved moisture balance with the use of collagen. 05/25/2022: The medial foot wound continues to contract. It is now substantially smaller with just a little slough on the surface. The anterior tibial surface wound continues to enlarge further. Once again, this seems to be secondary to moisture. The lateral leg wound does not seem to be changing much in size, but the moisture balance is better. 06/01/2022: The anterior tibial wound is closed. The medial foot wound is down to just a very small, couple of millimeters, opening. The lateral leg wound has good moisture balance, but remains unchanged in size. 12/15; the patient's anterior  tibial wound has reopened, however the area on his right first metatarsal head is closed. The major wound is actually on the superior part of his surgical wound in the left lateral thigh. Not a completely viable surface under illumination. This may at some point require a debridement I think he is currently using Prisma. As noted the left medial foot wound has closed 06/14/2022: The anterior tibial wound has closed. The lateral leg wound has a better surface but is basically unchanged in size. The left medial foot wound has reopened. It looks as though there was some callus accumulation and moisture got under the callus which caused the tissue to break down again. 06/21/2022: A new wound has opened up just distal to the previous anterior tibial wound. It is small but has hypertrophic granulation tissue present. The lateral leg wound is a  little bit narrower and has a layer of slough on the surface. The left medial foot wound is down to just a pinhole. His custom orthotics should be available next week. 06/28/2022: The wound on his first metatarsal head has healed. He has developed a new small wound on his medial lower leg, in an old scar site. The lateral leg wound continues to contract but continues to accumulate slough, as well. 07/03/2022: Despite wearing his custom orthopedic shoes, he managed to reopen the wound on his first metatarsal head. He says he thinks his foot got wet and then some skin lifted up and he peeled this away. Both of the lower leg wounds are smaller and have some dry eschar on the surface. The lateral leg wound is quite a bit narrower today. 07/12/2022: The medial lower leg wound is closed. The anterior lower leg wound has contracted considerably. The lateral upper leg wound is narrower with a layer of slough on the surface. The first metatarsal head wound is also smaller, but had copious drainage which saturated the foam border dressing and resulted in some periwound tissue maceration. Fortunately there was no breakdown at this site. 07/19/2022: The lower leg shows signs of significant maceration; I think he must be sweating excessively inside his cast. There are several areas of skin breakdown present. The wound on his foot is smaller and that on his lateral leg is narrower and is shorter by about a centimeter. 07/26/2022: Last week we used a zinc Coflex wrap prior to applying his total contact cast and this has had the effect of keeping his skin from getting macerated this week. The anterior leg wound has epithelialized substantially. The lateral leg wound is significantly smaller with just a bit of slough on the surface. The first metatarsal head wound is also smaller this week. Electronic Signature(s) Signed: 07/26/2022 12:08:05 PM By: [name] Maudlin MD FACS Entered By: [name] on 07/26/2022  12:08:04 -------------------------------------------------------------------------------- Physical Exam Details Patient Name: Date of Service: [name]. 07/26/2022 10:45 A M Medical Record Number: [id number] Patient Account Number: [id number] Date of Birth/Sex: Treating RN: 30-Nov-1950 (72 y.o. Kathrin Penner ([id number]) [id number] Page 7 of 20 Primary Care Provider: Jilda [name] Other Clinician: Referring Provider: Treating Provider/Extender: [name] in Treatment: 82 Constitutional . no acute distress. Respiratory Normal work of breathing on room air. Notes 07/26/2022: The anterior leg wound has epithelialized substantially. The lateral leg wound is significantly smaller with just a bit of slough on the surface. The first metatarsal head wound is also smaller this week. Electronic Signature(s) Signed: 07/26/2022 12:09:19 PM By: [name] Maudlin MD FACS Entered By:  [name] on 07/26/2022 12:09:19 -------------------------------------------------------------------------------- Physician Orders Details Patient Name: Date of Service: [name], [name] 07/26/2022 10:45 A M Medical Record Number: [id number] Patient Account Number: [id number] Date of Birth/Sex: Treating RN: [birth year] (72 y.o. 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 - Dr [name] - Room 2 Return A Thursday 08/02/22 at 10:45am Anesthetic Wound #22 Left,Proximal,Lateral Lower Leg (In clinic) Topical Lidocaine 4% applied to wound bed Bathing/ Shower/ Hygiene May shower with protection but do not get wound dressing(s) wet. Protect dressing(s) with water repellant cover (for example, large plastic bag) or a cast cover and may then take shower. Edema Control - Lymphedema / SCD / Other Avoid standing for long periods of time. Patient to wear own compression stockings every day. - on right leg; Moisturize legs daily. Compression stocking or Garment 20-30 mm/Hg pressure to: - left leg daily Off-Loading Total Contact Cast to Left Lower Extremity Other: - minimal weight bearing left foot Wound Treatment Wound #18R - Metatarsal head first Wound Laterality: Plantar, Left Cleanser: Soap and Water 1 x Per Week/30 Days Discharge Instructions: May shower and wash wound with dial antibacterial soap and water prior to dressing change. [name], [name] ([id number]) [id number] Page 8 of 20 Cleanser: Wound Cleanser 1 x Per Week/30 Days Discharge Instructions: Cleanse the wound with wound cleanser prior to applying a clean dressing using gauze sponges, not tissue or cotton balls. Prim Dressing: Sorbalgon AG Dressing 2x2 (in/in) 1 x Per Week/30 Days ary Discharge Instructions: Apply to wound bed as instructed Secondary Dressing: Zetuvit Plus Silicone Border Dressing 4x4 (in/in) 1 x Per Week/30 Days Discharge Instructions: Apply silicone border over primary dressing as directed. Wound #22 - Lower Leg Wound Laterality: Left, Lateral, Proximal Cleanser: Soap and Water 3 x Per Week/30 Days Discharge Instructions: May shower and wash wound with dial antibacterial soap and water prior to dressing change. Cleanser: Wound Cleanser 3 x Per  Week/30 Days Discharge Instructions: Cleanse the wound with wound cleanser prior to applying a clean dressing using gauze sponges, not tissue or cotton balls. Peri-Wound Care: Sween Lotion (Moisturizing lotion) 3 x Per Week/30 Days Discharge Instructions: Apply moisturizing lotion to the leg Topical: Skintegrity Hydrogel 4 (oz) 3 x Per Week/30 Days Discharge Instructions: Apply hydrogel as directed Prim Dressing: Endoform 2x2 in 3 x Per Week/30 Days ary Discharge Instructions: Moisten with Hydrogel or saline Secondary Dressing: ABD Pad, 8x10 (Generic) 3 x Per Week/30 Days Discharge Instructions: Apply over primary dressing as directed. Secured With: Elastic Bandage 4 inch (ACE bandage) 3 x Per Week/30 Days Discharge Instructions: Secure with ACE bandage as directed. Wound #30 - Lower Leg Wound Laterality: Left, Anterior Cleanser: Soap and Water 1 x Per Week/30 Days Discharge Instructions: May shower and wash wound with dial antibacterial soap and water prior to dressing change. Cleanser: Wound Cleanser 1 x Per Week/30 Days Discharge Instructions: Cleanse the wound with wound cleanser prior to applying a clean dressing using  gauze sponges, not tissue or cotton balls. Prim Dressing: Sorbalgon AG Dressing 2x2 (in/in) 1 x Per Week/30 Days ary Discharge Instructions: Apply to wound bed as instructed Secondary Dressing: Zetuvit Plus Silicone Border Dressing 4x4 (in/in) 1 x Per Week/30 Days Discharge Instructions: Apply silicone border over primary dressing as directed. Compression Wrap: CoFlex TLC Zinc 1 x Per Week/30 Days Electronic Signature(s) Signed: 07/26/2022 12:21:08 PM By: [name] Maudlin MD FACS Entered By: [name] on 07/26/2022 12:09:51 -------------------------------------------------------------------------------- Problem List Details Patient Name: Date of Service: [name]. 07/26/2022 10:45 A M Medical Record Number: [id number] Patient Account Number:  [id number] Date of Birth/Sex: Treating RN: Oct 12, 1950 (72 y.o. M) Primary Care Provider: Jilda [name] Other Clinician: Referring Provider: Treating Provider/Extender: [name] in Treatment: 55 Active Problems ICD-10 Encounter Code Description Active Date MDM Diagnosis [name], [name] ([id number]) [id number] Page 9 of 20 L97.528 Non-pressure chronic ulcer of other part of left foot with other specified 04/12/2021 No Yes severity L97.828 Non-pressure chronic ulcer of other part of left lower leg with other specified 04/12/2021 No Yes severity E11.621 Type 2 diabetes mellitus with foot ulcer 04/12/2021 No Yes E11.51 Type 2 diabetes mellitus with diabetic peripheral angiopathy without gangrene 04/12/2021 No Yes I89.0 Lymphedema, not elsewhere classified 04/12/2021 No Yes I87.322 Chronic venous hypertension (idiopathic) with inflammation of left lower 04/12/2021 No Yes extremity Inactive Problems ICD-10 Code Description Active Date Inactive Date L97.828 Non-pressure chronic ulcer of other part of left lower leg with other specified severity 06/08/2022 06/08/2022 E11.42 Type 2 diabetes mellitus with diabetic polyneuropathy 04/12/2021 04/12/2021 L02.416 Cutaneous abscess of left lower limb 06/13/2021 06/13/2021 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 07/20/2021 07/20/2021 Resolved Problems Electronic Signature(s) Signed: 07/26/2022 12:06:27 PM By: [name] Maudlin MD FACS Entered By: [name] on 07/26/2022 12:06:26 -------------------------------------------------------------------------------- Progress Note Details Patient Name: Date of Service: [name]. 07/26/2022 10:45 A M Medical Record Number: [id number] Patient Account Number: [id number] Date of Birth/Sex: Treating RN: [birth year] (72 y.o. M) Primary Care Provider: Jilda [name] Other Clinician: Referring Provider: Treating  Provider/Extender: [name] in Treatment: 56 Subjective Chief Complaint Information obtained from Patient Left leg and foot ulcers 04/12/2021; patient is here for wounds on his left lower leg and left plantar foot over the first metatarsal head OUSMANE, SEEMAN ([id number]) [id number] Page 10 of 20 History of Present Illness (HPI) 10/11/17; Mr. Rando is a 72 year old man who tells me that in 2015 he slipped down the latter traumatizing his left leg. He developed a wound in the same spot the area that we are currently looking at. He states this closed over for the most part although he always felt it was somewhat unstable. In 2016 he hit the same area with the door of his car had this reopened. He tells me that this is never really closed although sometimes an inflow it remains open on a constant basis. He has not been using any specific dressing to this except for topical antibiotics the nature of which were not really sure. His primary doctor did send him to see Dr. Einar [name] of interventional cardiology. He underwent an angiogram on 08/06/17 and he underwent a PTA and directional atherectomy of the lesser distal SFA and popliteal arteries which resulted in brisk improvement in blood flow. It was noted that he had 2 vessel runoff through the anterior tibial and peroneal. He is also been to see vascular and interventional radiologist. He was not felt to have  any significant superficial venous insufficiency. Presumably is not a candidate for any ablation. It was suggested he come here for wound care. The patient is a type II diabetic on insulin. He also has a history of venous insufficiency. ABIs on the left were noncompressible in our clinic 10/21/17; patient we admitted to the clinic last week. 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 We put Iodosorb on him after an aggressive  debridement and 3 layer compression. He complained of pain in his ankle and itching with is skin in fact he scratched the area on the medial calf superiorly at the rim of our wraps and he has 2 small open areas in that location today which are new. I changed his primary dressing today to silver collagen. As noted he is already had revascularization and does not have any significant superficial venous insufficiency that would be amenable to ablation 10/28/17; patient admitted to the clinic 2 weeks ago. He has a smaller Wound. Scratch injury from last week revealed. There is large wound over the tibial area. This is smaller. Granulation looks healthy. No need for debridement. 11/04/17; the wound on the left lateral calf looks better. Improved dimensions. Surface of this looks better. We've been maintaining him and Kerlix Coban wraps. He finds this much more comfortable. Silver collagen dressing 11/11/17; left lateral Wound continues to look healthy be making progress. Using a #5 curet I removed removed nonviable skin from the surface of the wound and then necrotic debris from the wound surface. Surface of the wound continues to look healthy. ooHe also has an open area on the left great toenail bed. We've been using topical antibiotics. 11/19/17; left anterior lateral wound continues to look healthy but it's not closed. ooHe also had a small wound above this on the left leg ooInitially traumatic wounds in the setting of significant chronic venous insufficiency and stasis dermatitis 11/25/17; left anterior wounds superiorly is closed still a small wound inferiorly. 12/02/17; left anterior tibial area. Arrives today with adherent callus. Post debridement clearly not completely closed. Hydrofera Blue under 3 layer compression. 12/09/17; left anterior tibia. Circumferential eschar however the wound bed looks stable to improved. We've been using Hydrofera Blue under 3 layer compression 12/17/17; left anterior  tibia. Apparently this was felt to be closed however when the wrap was taken off there is a skin tear to reopen wounds in the same area we've been using Hydrofera Blue under 3 layer compression 12/23/17 left anterior tibia. Not close to close this week apparently the [facility] was stuck to this again. Still circumferential eschar requiring debridement. I put a contact layer on this this time under the Hydrofera Blue 12/31/17; left anterior tibia. Wound is better slight amount of hyper-granulation. Using Hydrofera Blue over Adaptic. 01/07/18; left anterior tibia. The wound had some surface eschar however after this was removed he has no open wound.he was already revascularized by Dr. Einar [name] when he came to our clinic with atherectomy of the left SFA and popliteal artery. He was also sent to interventional radiology for venous reflux studies. He was not felt to have significant reflux but certainly has chronic venous changes of his skin with hemosiderin deposition around this area. He will definitely need to lubricate his skin and wear compression stocking and I've talked to him about this. READMISSION 05/26/2018 This is a now 72 year old man we cared for with traumatic wounds on his left anterior lower extremity. He had  been previously revascularized during that admission by Dr. Einar [name]. Apparently in follow-up Dr. Einar [name] noted that he had deterioration in his arterial status. He underwent a stent placement in the distal left SFA on 04/22/2018. 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 He went back to the angiography suite on 04/30/2018 he underwent PTA and balloon angioplasty of the occluded left mid anterior tibial artery, thrombotic occlusion went from 100 to 0% which reconstitutes the posterior tibial artery. He had thrombectomy and aspiration of the peroneal artery. The stent placed in the distal SFA left SFA was still occluded. He was discharged on Xarelto, it was noted on the  discharge summary from this hospitalization that he had gangrene at the tip of his left fifth toe and there were expectations this would auto amputate. 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 He has been recuperating at [facility] in [facility] after the most recent hospitalization. He is going home tomorrow. He tells me that 2 weeks ago he traumatized the tip of his left fifth toe. He came in urgently for our review of this. This was a history of before I noted that Dr. Einar [name] had already noted dry gangrenous changes of the left fifth toe 06/09/2018; 2-week follow-up. I did contact Dr. Einar [name] after his last appointment and he apparently saw 1 of Dr. Irven Shelling colleagues the next day. He does not follow-up with Dr. Einar [name] himself until Thursday of this week. 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 Nevertheless there is no evidence of infection no drainage and no pain. He had a new area that this week when we were signing him in today on the left anterior mid tibia area, this is in close proximity to the previous wound we have dealt with in this clinic. 06/23/2018; 2-week follow-up. I did not receive a recent note from Dr. Einar [name] to review today. Our office is trying to obtain this. He is apparently not planning to do further vascular interventions and wondered about compression to try and help with the patient's chronic venous insufficiency. However we are also concerned about the arterial flow. ooHe arrives in clinic today with a new area on the left third toe. The areas on the calf/anterior tibia are close to closing. The left fifth toe is still mummified using Betadine. -In reviewing things with the patient he has what sounds like claudication with mild to moderate amount of activity. 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 I prescribed Levaquin over the phone while we attempted to  arrange a plan of care. However the patient called yesterday to report he had low-grade fever and he came in today acutely. There is been a marked deterioration in the left third toe with spreading cellulitis up into the dorsal left foot. He was referred to the emergency room. Readmission: 06/29/2020 patient presents today for reevaluation here in our clinic he was previously treated by Dr. Dellia Nims at the latter part of 2019 in 2 the beginning of 2020. Subsequently we have not seen him since that time in the interim he did have evaluation with vein and vascular specialist specifically Dr. Anice Paganini who did perform quite extensive work for a left femoral to anterior tibial artery bypass. With that being said in the interim the patient has developed significant lymphedema and has wounds that he tells me have really never healed in regard to the incision  site on the left leg. He also has multiple wounds on the feet for various reasons some of which is that he tends to pick at his feet. Fortunately there is no signs of active infection systemically at this time he does have some wounds that are little bit deeper but most are fairly superficial he seems to have good blood flow and overall everything appears to be healthy I see no bone exposed and no obvious signs of osteomyelitis. I do not know that he necessarily needs a x-ray at this point although that something we could consider depending on how things progress. The patient does have a history of lymphedema, diabetes, this is type II, chronic kidney disease stage III, hypertension, and history of peripheral vascular disease. 07/05/2020; patient admitted last week. Is a patient I remember from 2019 he had a spreading infection involving the left foot and we sent him to the hospital. 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 He subsequently had a left femoral to anterior tibial bypass by Dr.Cain vein and vascular.  He also has severe lymphedema with chronic skin changes related to that on the left leg. The most problematic area that was new today was on the left medial great toe. This was apparently a small area last week there was purulent drainage which our intake nurse cultured. Also areas on the left medial foot and heel left lateral foot. He has 2 areas on the left medial calf left lateral calf in the setting of the severe lymphedema. [name], [name] ([id number]) [id number] Page 11 of 20 07/13/2020 on evaluation today patient appears to be doing better in my opinion compared to his last visit. The good news is there is no signs of active infection systemically and locally I do not see any signs of infection either. He did have an x-ray which was negative that is great news he had a culture which showed MRSA but at the same time he is been on the doxycycline which has helped. I do think we may want to extend this for 7 additional days 1/25; patient admitted to the clinic a few weeks ago. 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 We have been putting him under compression his edema control is a lot better but he is severe verricused skin on the left leg. He is really done quite well he still has an open area on the left medial calf and the left medial first metatarsal head. We have been using silver collagen on the leg silver alginate on the foot 07/27/2020 upon evaluation today patient appears to be doing decently well in regard to his wounds. He still has a lot of dry skin on the left leg. Some of this is starting to peel back and I think he may be able to have them out by removing some that today. Fortunately there is no signs of active infection at this time on the left leg although on the right leg he does appear to have swelling and erythema as well as some mild warmth to touch. This does have been concerned about the possibility of  cellulitis although within the differential diagnosis I do think that potentially a DVT has to be at least considered. We need to rule that out before proceeding would just call in the cellulitis. Especially since he is having pain in the posterior aspect of his calf muscle. 2/8; the patient had seen  sparingly. He has severe skin changes of chronic lymphedema in the left leg thickened hyperkeratotic verrucous skin. He has an open wound on the medial part of the left first met head left mid tibia. He also has a rim of nonepithelialized skin in the anterior mid tibia. He brought in the AmLactin lotion that was been prescribed although I am not sure under compression and its utility. There concern about cellulitis on the right lower leg the last time he was here. He was put on on antibiotics. His DVT rule out was negative. The right leg looks fine he is using his stocking on this area 08/10/2020 upon evaluation today patient appears to be doing well with regard to his leg currently. He has been tolerating the dressing changes without complication. Fortunately there is no signs of active infection which is great news. Overall very pleased with where things stand. 2/22; the patient still has an area on the medial part of the left first met his head. This looks better than when I last saw this earlier this month he has a rim of epithelialization but still some surface debris. Mostly everything on the left leg is healed. There is still a vulnerable in the left mid tibia area. 08/30/2020 upon evaluation today patient appears to be doing much better in regard to his wounds on his foot. 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 Nonetheless I think we will address that today I Minna send in a prescription for him in that regard. Overall though there does not appear to be any signs  of significant worsening. 09/07/2020 on evaluation today patient's wounds over his left foot appear to be doing excellent. I do not see any signs of infection there is some callus buildup this can require debridement for certain but overall I feel like he is managing quite nicely. He still using the AmLactin cream which has been beneficial for him as well. 3/22; left foot wound is closed. There is no open area here. He is using ammonium lactate lotion to the lower extremities to help exfoliate dry cracked skin. He has compression stockings from elastic therapy in [place]. The wound on the medial part of his left first met head is healed today. READMISSION 04/12/2021 Mr. Bobrowski is a patient we know fairly well he had a prolonged stay in clinic in 2019 with wounds on his left lateral and left anterior lower extremity in the setting of chronic venous insufficiency. More recently he was here earlier this year with predominantly an area on his left foot first metatarsal head plantar and he says the plantar foot broke down on its not long after we discharged him but he did not come back here. The last few months areas of broken down on his left anterior and again the left lateral lower extremity. The leg itself is very swollen chronically enlarged a lot of hyperkeratotic dry Berry Q skin in the left lower leg. His edema extends well into the thigh. He was seen by Dr. Donzetta Matters. He had ABIs on 03/02/2021 showing an ABI on the right of 1 with a TBI of 0.72 his ABI in the left at 1.09 TBI of 0.99. Monophasic and biphasic waveforms on the right. On the left monophasic waveforms were noted he went on to have an angiogram on 03/27/2021 this showed the aortic aortic and iliac segments were free  of flow-limiting stenosis the left common femoral vein to evaluate the left femoral to anterior tibial artery bypass was unobstructed the bypass was patent without any areas of stenosis. We discharged the patient in bilateral  juxta lite stockings but very clearly that was not sufficient to control the swelling and maintain skin integrity. He is clearly going to need compression pumps. 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 This is fortunate because he is on his feet for long periods of time. 10/27; patient comes in with our intake nurse reporting copious amount of green drainage from the left anterior mid tibia the left dorsal foot and to a lesser extent the left medial mid tibia. We left the compression wrap on all week for the amount of edema in his left leg is quite a bit better. We use silver alginate as the primary dressing 11/3; edema control is good. Left anterior lower leg left medial lower leg and the plantar first metatarsal head. The left anterior lower leg required debridement. Deep tissue culture I did of this wound showed MRSA I put him on 10 days of doxycycline which she will start today. We have him in compression wraps. He has a security card and [employer] however he is retiring on November 15. We will need to then get him into a better offloading boot for the left foot perhaps a total contact cast 11/10; edema control is quite good. Left anterior and left medial lower leg wounds in the setting of chronic venous insufficiency and lymphedema. He also has a substantial area over the left plantar first metatarsal head. 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 He has significant hypergranulation on the left plantar foot wound. The patient is a diabetic but he does not have significant PAD 11/17; edema control is quite good. Left anterior and left medial lower leg wounds look better. The really concerning area remains the area on the left plantar first metatarsal head. He has a rim of epithelialization. He has been using a surgical shoe The patient is now retired  from a a [employer] I have gone over with him the need to offload this area aggressively. Starting today with a forefoot off loader but . possibly a total contact cast. 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 Arrives with a very significant odor from I think all of the wounds on the left leg and the left foot. Because of this I did not put a total contact cast on him today but will could still consider this. His wife was having cataract surgery which is the reason he missed the appointment 12/6. I saw this man 5 days ago with a swelling below the popliteal fossa. I thought he actually might have a Baker's cyst however the DVT rule out study that we could arrange right away was negative the technician told me this was not a ruptured Baker's cyst. We attempted to get this aspirated by under ultrasound guidance in interventional radiology however all they did was an ultrasound however it shows an extensive fluid collection 62 x 8 x 9.4 in the left thigh and left calf. The patient states he thinks this started 8 days ago or so  but he really is not complaining of any pain, fever or systemic symptoms. He has not ha 12/20; after some difficulty I managed to get the patient into see Dr. Donzetta Matters. Eventually he was taken into the hospital and had a drain put in the fluid collection below his left knee posteriorly extending into the posterior thigh. He still has the drain in place. Culture of this showed moderate staff aureus few Morganella and few Klebsiella he is now on doxycycline and ciprofloxacin as suggested by infectious disease he is on this for a month. The drain will remain in place until it stops draining 12/29; he comes in today with the 1 wound on his left leg and the area on the left plantar first met head significantly smaller. Both look healthy. He still has the drain in the left  leg. He says he has to change this daily. Follows up with Dr. Donzetta Matters on January 11. 06/29/2021; the wounds that I am following on the left leg and left first met head continued to be quite healthy. However the area where his inferior drain is in place had copious amounts of drainage which was green in color. The wound here is larger. Follows up with Dr. Gwenlyn Saran of vein and vascular his surgeon next [name], [name] ([id number]) [id number] Page 12 of 20 week as well as infectious disease. He remains on ciprofloxacin and doxycycline. He is not complaining of excessive pain in either one of the drain areas 1/12; the patient saw vascular surgery and infectious disease. Vascular surgery has left the drain in place as there was still some notable drainage still see him back in 2 weeks. Dr. Velna Ochs stop the doxycycline and ciprofloxacin and I do not believe he follows up with them at this point. Culture I did last week showed both doxycycline resistant MRSA and Pseudomonas not sensitive to ciprofloxacin although only in rare titers 1/19; the patient's wound on the left anterior lower leg is just about healed. We have continued healing of the area that was medially on the left leg. Left first plantar metatarsal head continues to get smaller. The major problem here is his 2 drain sites 1 on the left upper calf and lateral thigh. There is purulent drainage still from the left lateral thigh. I gave him antibiotics last week but we still have recultured. He has the drain in the area I think this is eventually going to have to come out. I suspect there will be a connecting wound to heal here perhaps with improved VAc 1/26; the patient had his drain removed by vein and vascular on 1/25/. This was a large pocket of fluid in his left thigh that seem to tunnel into his left upper calf. 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 His mention his Penrose drain was removed today. He now  has a tunneling wound on his left calf and left thigh. Both of these probe widely towards each other although I cannot really prove that they connect. Both wounds on his lower leg anteriorly are closed and his area over the first metatarsal head on his right foot continues to improve. We are using Hydrofera Blue here. He also saw infectious disease culture of the abscess they noted was polymicrobial with MRSA, Morganella and Klebsiella he was treated with doxycycline and ciprofloxacin for 4 weeks ending on 07/03/2021. They did not recommend any further antibiotics. Notable that while he still had the Penrose drain in place last week he had purulent drainage  coming out of the inferior IandD site this grew [facility], MRSA and Pseudomonas but there does not appear to be any active infection in this area today with the drain out and he is not systemically unwell 2/2; with regards to the drain sites the superior one on the thigh actually is closed down the one on the upper left lateral calf measures about 8 and half centimeters which is an improvement seems to be less prominent although still with a lot of drainage. The only remaining wound is over the first metatarsal head on the left foot and this looks to be continuing to improve with Hydrofera Blue. 2/9; the area on his plantar left foot continues to contract. Callus around the wound edge. The drain sites specifically have not come down in depth. We put the wound VAC on Monday he changed the canister late last night our intake nurse reported a pocket of fluid perhaps caused by our compression wraps 2/16; continued improvement in left foot plantar wound. drainage site in the calf is not improved in terms of depth (wound vac) 2/23; continued improvement in the left foot wound over the first metatarsal head. With regards to the drain sites the area on his thigh laterally is healed however the open area on his calf is small in terms of circumference by  still probes in by about 15 cm. Within using the wound VAC. Hydrofera Blue on his foot 08/24/2021: The left first metatarsal head wound continues to improve. The wound bed is healthy with just some surrounding callus. Unfortunately the open drain site on his calf remains open and tunnels at least 15 cm (the extent of a Q-tip). This is despite several weeks of wound VAC treatment. Based on reading back through the notes, there has been really no significant change in the depth of the wound, although the orifice is smaller and the more cranial wound on his thigh has closed. I suspect the tunnel tracks nearly all the way to this location. 08/31/2021: Continued improvement in the left first metatarsal head wound. There has been absolutely no improvement to the long tunnel from his open drain site on his calf. We have tried to get him into see vascular surgery sooner to consider the possibility of simply filleting the tract open and allowing it to heal from the bottom up, likely with a wound VAC. They have not yet scheduled a sooner appointment than his current mid April 09/14/2021: He was seen by vascular surgery and they took him to the operating room last week. They opened a portion of the tunnel, but did not extend the entire length of the known open subcutaneous tract. I read Dr. Claretha Cooper operative note and it is not clear from that documentation why only a portion of the tract was opened. The heaped up granulation tissue was curetted and removed from at least some portion of the tract. They did place a wound VAC and applied an Unna boot to the leg. The ulcer on his left first metatarsal head is smaller today. The bed looks good and there is just a small amount of surrounding callus. 09/21/2021: The ulcer on his left first metatarsal head looks to be stalled. There is some callus surrounding the wound but the wound bed itself does not appear particularly dynamic. The tunnel tract on his lateral left leg seems  to be roughly the same length or perhaps slightly smaller but the wound bed appears healthy with good granulation tissue. He opened up a new wound  on his medial thigh and the site of a prior surgical incision. He says that he did this unconsciously in his sleep by scratching. 09/28/2021: Unfortunately, the ulcer on his left first metatarsal head has extended underneath the callus toward the dorsum of his foot. The medial thigh wounds are roughly the same. The tunnel on his lateral left leg continues to be problematic; it is longer than we are able to actually probe with a Q-tip. I am still not certain as to why Dr. Donzetta Matters did not open this up entirely when he took the patient to the operating room. We will likely be back in the same situation with just a small superficial opening in a long unhealed tract, as the open portion is granulating in nicely. 10/02/2021: The patient was initially scheduled for a nurse visit, but we are also applying a total contact cast today. The plantar foot wound looks clean without significant accumulated callus. We have been applying Prisma silver collagen to the site. 10/05/2021: The patient is here for his first total contact cast change. We have tried using gauze packing strips in the tunnel on his lateral leg wound, but this does not seem to be working any better than the white VAC foam. The foot ulcer looks about the same with minimal periwound callus. Medial thigh wound is clean with just some overlying eschar. 10/12/2021: The plantar foot wound is stable without any significant accumulation of periwound callus. The surface is viable with good granulation tissue. The medial thigh wounds are much smaller and are epithelializing. On the other hand, he had purulent drainage coming from the tunnel on his lateral leg. He does go back to see Dr. Donzetta Matters next week and is planning to ask him why the wound tunnel was not completely opened at the time of his most recent  operation. 10/19/2021: The plantar foot wound is markedly improved and has epithelial tissue coming through the surface. The medial thigh wounds are nearly closed with just a tiny open area. He did see Dr. Donzetta Matters earlier this week and apparently they did discuss the possibility of opening the sinus tract further and enabling a wound VAC application. Apparently there are some limits as to what Dr. 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 I think if we could get the tract open to the level of the popliteal fossa, this would greatly aid in her ability to get this chart closed. That being said, however, today when I probed the tract with a Q-tip, I was not able to insert the entirety of the Q-tip as I have on previous occasions. The tunnel is shorter by about 4 cm. The surface is clean with good granulation tissue and no further episodes of purulent drainage. 10/30/2021: Last week, the patient underwent surgery and had the long tract in his leg opened. There was a rind that was debrided, according to the operative report. His medial thigh ulcers are closed. The plantar foot wound is clean with a good surface and some built up surrounding callus. 11/06/2021: The overall dimensions of the large wound on his lateral leg remain about the same, but there is good granulation tissue present and the tunneling is a little bit shorter. He has a new wound on his anterior tibial surface, in the same location where he had a similar lesion in the past. The plantar foot wound is clean with some buildup surrounding callus. Just toward the medial aspect of his foot, however, there is an area of  darkening that once debrided, revealed another opening in the skin surface. 11/13/2021: The anterior tibial surface wound is closed. The plantar foot wound has some surrounding callus buildup. The area of darkening that I debrided last week and revealed an opening in the skin surface has closed again.  The tunnel in the large wound on his lateral leg has come in by about 3 cm. There is healthy granulation tissue on the entire wound surface. 11/23/2021: The patient was out of town last week and did wet-to-dry dressings on his large wound. 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 Unfortunately, he picked open the wound on his left medial thigh. He says that it was itching and he just could not stop scratching it until it was open again. The wound on his plantar foot is smaller and has not accumulated a tremendous amount of callus. The lateral leg wound is shallower and the tunnel has also decreased in depth. There is just a little bit of slough accumulation on the surface. KENLEE, MALER ([id number]) [id number] Page 13 of 20 11/30/2021: Another portion of his left medial thigh has been opened up. All of these wounds are fairly superficial with just a little bit of slough and eschar accumulation. The wound on his plantar foot is almost closed with just a bit of eschar and periwound callus accumulation. The lateral leg wound is nearly flush with the surrounding skin and the tunnel is markedly shallower. 12/07/2021: There is just 1 open area on his left medial thigh. It is clean with just a little bit of perimeter eschar. The wound on his plantar foot continues to contract and just has some eschar and periwound callus accumulation. The lateral leg wound is closing at the more distal aspect and the tunnel is smaller. The surface is nearly flush with the surrounding skin and it has a good bed of granulation tissue. 12/14/2021: The thigh and foot wounds are closed. The lateral leg wound has closed over approximately half of its length. The tunnel continues to contract and the surface is now flush with the surrounding skin. The wound bed has robust granulation tissue. 12/22/2021: The thigh and foot wounds have reopened.  The foot wound has a lot of callus accumulation around and over it. The thigh wound is tiny with just a little bit of slough in the wound bed. The lateral leg wound continues to contract. His vascular surgeon took the wound VAC off earlier in the week and the patient has been doing wet-to-dry dressings. There is a little slough accumulation on the surface. The tunnel is about 3 cm in depth at this point. 12/28/2021: The thigh wound is closed again. The foot wound has some callus that subsequently has peeled back exposing just a small slit of a wound. The lateral leg wound Is down to about half the size that it originally was and the tunnel is down to about half a centimeter in depth. 01/04/2022: The thigh wound remains closed. The foot wound has heavy callus overlying the wound site. Once this was debrided, the wound was found to be closed. The lateral leg wound is smaller again this week and very superficial. No tunnel could be identified. 01/12/2022: The thigh and foot wounds both remain closed. The lateral leg wound is now nearly flush with the skin surface. There is good granulation tissue present with a light layer of slough. 01/19/2022: Due to the way his wrap was placed,  the patient did not change the dressing on his thigh at all and so the foam was saturated and his skin is macerated. There is a light layer of slough on the wound surface. The underlying granulation tissue is robust and healthy-appearing. He has heavy callus buildup at the site of his first metatarsal head wound which is still healed. 02/01/2022: He has been in silver alginate. When he removed the dressing from his thigh wound, however, some leg, superficially reopening a portion of the wound that had healed. In addition, underneath the callus at his left first metatarsal head, there appears to be a blister and the wound appears to be open again. 02/08/2022: The lateral leg wound has contracted substantially. There is eschar and a  light layer of slough present. He says that it is starting to pull and is uncomfortable. On inspection, there is some puckering of the scar and the eschar is quite dry; this may account for his symptoms. On his first metatarsal head, the wound is much smaller with just some eschar on the surface. The callus has not reaccumulated. He reports that he had a blister come up on his medial thigh wound at the distal aspect. It popped and there is now an opening in his skin again. Looking back through his Becker of wound photos, there is what looks like a permanent suture just deep to this location and it may be trying to erode through. We have been using silver alginate on his wounds. 02/15/2022: The lateral leg wound is about half the size it was last week. It is clean with just a little perimeter eschar and light slough. The wound on his first metatarsal head is about the same with heavy callus overlying it. The medial thigh wound is closed again. He does have some skin changes on the top of his foot that looks potentially yeast related. 02/22/2022: The skin on the top of his foot improved with the use of a topical antifungal. The lateral leg wound continues to contract and is again smaller this week. There is a little bit of slough and eschar on the surface. The first metatarsal head wound is a little bit smaller but has reaccumulated a thick callus over the top. He decided to try to trim his toenail and ultimately took the entire nail off of his left great toe. 03/02/2022: His lateral leg wound continues to improve, as does the wound on his left great toe. Unfortunately, it appears that somehow his foot got wet and moisture seeped in through the opening causing his skin to lift. There is a large wound now overlying his first metatarsal on both the plantar, medial, and dorsal portion of his foot. There is necrotic tissue and slough present underneath the shaggy macerated skin. 03/08/2022: The lateral leg wound  is smaller again today. There is just a light layer of slough and eschar on the surface. The great toe wound is smaller again today. The first metatarsal wound is a little bit smaller today and does not look nearly as necrotic and macerated. There is still slough and nonviable tissue present. 03/15/2022: The lateral leg wound is narrower and just has a little bit of light slough buildup. The first metatarsal wound still has a fair amount of moisture affecting the periwound skin. The great toe wound is healed. 03/22/2022: The lateral leg wound is now isolated to just at the level of his knee. There is some eschar and slough accumulation. The first metatarsal head wound has epithelialized tremendously  and is about half the size that it was last week. He still has some maceration on the top of his foot and a fungal odor is present. 03/29/2022: T oday the patient's foot was macerated, suggesting that the cast got wet. The patient has also been picking at his dry skin and has enlarged the wound on his left lateral leg. In the time between having his cast removed and my evaluation, he had picked more dry skin and opened up additional wounds on his Achilles area and dorsal foot. The plantar first metatarsal head wound, however, is smaller and clean with just macerated callus around the perimeter and light slough on the surface. The lateral leg wound measured a little bit larger but is also fairly clean with eschar and minimal slough. 04/02/2022: The patient had vascular studies done last Friday and so his cast was not applied. He is here today to have that done. 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 04/05/2022: Both wounds are smaller and quite clean. There is just a little biofilm on the lateral leg wound. 10/20; the patient has a wound on the left lateral surgical incision at the level of his lateral knee this looks clean and improved. He is using silver alginate. He also has an area on his  left medial foot for which she is using Hydrofera Blue under a total contact cast both wounds are measuring smaller 04/20/2022: The plantar foot wound has contracted considerably and is very close to closing. The lateral leg wound was measured a little larger, but there was a tiny open area that was included in the measurements that was not included last week. He has some eschar around the perimeter but otherwise the wound looks clean. 04/27/2022: The lateral leg wound looks better this week. He says that midweek, he felt it was very dry and began applying hydrogel to the site. I think this was beneficial. The foot wound is nearly closed underneath a thick layer of dry skin and callus. 05/04/2022: The foot wound is healed. He has developed a new small ulcer on his anterior tibial surface about midway up his leg. It has a little slough on the surface. The lateral leg wound still is fairly dry, but clean with just a little biofilm on the surface. 05/11/2022: The wound on his foot reopened on Wednesday. A large blister formed which then broke open revealing the fat layer underneath. The ulcer on his anterior tibial surface is a little bit larger this week. The lateral leg wound has much better moisture balance this week. Fortunately, prior to his foot wound reopening, he did get the cast made for his orthotic. 05/15/2022: Already, the left medial foot wound has improved. The tissue is less macerated and the surface is clean. The ulcer on his anterior tibial surface continues to enlarge. This seems likely secondary to accumulated moisture. The lateral leg wound continues to have an improved moisture balance with the use of collagen. [name], [name] ([id number]) [id number] Page 14 of 20 05/25/2022: The medial foot wound continues to contract. It is now substantially smaller with just a little slough on the surface. The anterior tibial surface wound continues to enlarge further.  Once again, this seems to be secondary to moisture. The lateral leg wound does not seem to be changing much in size, but the moisture balance is better. 06/01/2022: The anterior tibial wound is closed. The medial foot wound is down to just a very small, couple of millimeters, opening. The lateral  leg wound has good moisture balance, but remains unchanged in size. 12/15; the patient's anterior tibial wound has reopened, however the area on his right first metatarsal head is closed. The major wound is actually on the superior part of his surgical wound in the left lateral thigh. Not a completely viable surface under illumination. This may at some point require a debridement I think he is currently using Prisma. As noted the left medial foot wound has closed 06/14/2022: The anterior tibial wound has closed. The lateral leg wound has a better surface but is basically unchanged in size. The left medial foot wound has reopened. It looks as though there was some callus accumulation and moisture got under the callus which caused the tissue to break down again. 06/21/2022: A new wound has opened up just distal to the previous anterior tibial wound. It is small but has hypertrophic granulation tissue present. The lateral leg wound is a little bit narrower and has a layer of slough on the surface. The left medial foot wound is down to just a pinhole. His custom orthotics should be available next week. 06/28/2022: The wound on his first metatarsal head has healed. He has developed a new small wound on his medial lower leg, in an old scar site. The lateral leg wound continues to contract but continues to accumulate slough, as well. 07/03/2022: Despite wearing his custom orthopedic shoes, he managed to reopen the wound on his first metatarsal head. He says he thinks his foot got wet and then some skin lifted up and he peeled this away. Both of the lower leg wounds are smaller and have some dry eschar on the surface. The  lateral leg wound is quite a bit narrower today. 07/12/2022: The medial lower leg wound is closed. The anterior lower leg wound has contracted considerably. The lateral upper leg wound is narrower with a layer of slough on the surface. The first metatarsal head wound is also smaller, but had copious drainage which saturated the foam border dressing and resulted in some periwound tissue maceration. Fortunately there was no breakdown at this site. 07/19/2022: The lower leg shows signs of significant maceration; I think he must be sweating excessively inside his cast. There are several areas of skin breakdown present. The wound on his foot is smaller and that on his lateral leg is narrower and is shorter by about a centimeter. 07/26/2022: Last week we used a zinc Coflex wrap prior to applying his total contact cast and this has had the effect of keeping his skin from getting macerated this week. The anterior leg wound has epithelialized substantially. The lateral leg wound is significantly smaller with just a bit of slough on the surface. The first metatarsal head wound is also smaller this week. Patient History Information obtained from Patient. Family History Diabetes - Mother, Heart Disease - Paternal Grandparents,Mother,Father,Siblings, Stroke - Father, No family history of Cancer, Hereditary Spherocytosis, Hypertension, Kidney Disease, Lung Disease, Seizures, Thyroid Problems, Tuberculosis. Social History Former smoker - quit 1999, Marital Status - Married, Alcohol Use - Moderate, Drug Use - No History, Caffeine Use - Rarely. Medical History Eyes Patient has history of Glaucoma - both eyes Denies history of Cataracts, Optic Neuritis Ear/Nose/Mouth/Throat Denies history of Chronic sinus problems/congestion, Middle ear problems Hematologic/Lymphatic Denies history of Anemia, Hemophilia, Human Immunodeficiency Virus, Lymphedema, Sickle Cell Disease Respiratory Patient has history of Sleep Apnea  - CPAP Denies history of Aspiration, Asthma, Chronic Obstructive Pulmonary Disease (COPD), Pneumothorax, Tuberculosis Cardiovascular Patient has history of Hypertension,  Peripheral Arterial Disease, Peripheral Venous Disease Denies history of Angina, Arrhythmia, Congestive Heart Failure, Coronary Artery Disease, Deep Vein Thrombosis, Hypotension, Myocardial Infarction, Phlebitis, Vasculitis Gastrointestinal Denies history of Cirrhosis , Colitis, Crohnoos, Hepatitis A, Hepatitis B, Hepatitis C Endocrine Patient has history of Type II Diabetes Denies history of Type I Diabetes Genitourinary Denies history of End Stage Renal Disease Immunological Denies history of Lupus Erythematosus, Raynaudoos, Scleroderma Integumentary (Skin) Denies history of History of Burn Musculoskeletal Patient has history of Gout -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

## 2022-08-02 ENCOUNTER — Encounter (HOSPITAL_BASED_OUTPATIENT_CLINIC_OR_DEPARTMENT_OTHER): Payer: Medicare Other | Admitting: General Surgery

## 2022-08-02 DIAGNOSIS — E11621 Type 2 diabetes mellitus with foot ulcer: Secondary | ICD-10-CM | POA: Diagnosis not present

## 2022-08-02 NOTE — Progress Notes (Addendum)
[name], [name] ([id number]) [id number] Page 1 of 20 Visit Report for 08/02/2022 Chief Complaint Document Details Patient Name: Date of Service: [name], [name] 08/02/2022 10:45 A M Medical Record Number: [id number] Patient Account Number: [id number] Date of Birth/Sex: Treating RN: December 26, 1950 (72 y.o. M) Primary Care Provider: Jilda [name] Other Clinician: Referring Provider: Treating Provider/Extender: [name] in Treatment: 68 Information Obtained from: Patient Chief Complaint Left leg and foot ulcers 04/12/2021; patient is here for wounds on his left lower leg and left plantar foot over the first metatarsal head Electronic Signature(s) Signed: 08/02/2022 11:22:30 AM By: [name] Maudlin MD FACS Entered By: [name] on 08/02/2022 11:22:30 -------------------------------------------------------------------------------- Debridement Details Patient Name: Date of Service: [name]. 08/02/2022 10:45 A M Medical Record Number: [id number] Patient Account Number: [id number] Date of Birth/Sex: Treating RN: 1950-07-27 (72 y.o. [name] Primary Care Provider: Jilda [name] Other Clinician: Referring Provider: Treating Provider/Extender: [name] in Treatment: 68 Debridement Performed for Assessment: Wound #22 Left,Proximal,Lateral Lower Leg Performed By: Physician [name] Maudlin, MD Debridement Type: Debridement Level of Consciousness (Pre-procedure): Awake and Alert Pre-procedure Verification/Time Out Yes - 11:19 Taken: Start Time: 11:20 Pain Control: Lidocaine 4% T opical Solution T Area Debrided (L x W): otal 3 (cm) x 0.3 (cm) = 0.9 (cm) Tissue and other material debrided: Non-Viable, Slough, Slough Level: Non-Viable Tissue Debridement Description: Selective/Open Wound Instrument: Curette Bleeding: Minimum Hemostasis Achieved: Pressure Response to Treatment: Procedure was tolerated  well Level of Consciousness (Post- Awake and Alert procedure): Post Debridement Measurements of Total Wound Length: (cm) 3 Width: (cm) 0.3 Depth: (cm) 0.1 Volume: (cm) 0.071 Character of Wound/Ulcer Post Debridement: Requires Further Debridement Post Procedure Diagnosis Same as [name] ([id number]) [id number] Page 2 of 20 Notes Scribed for Dr. Celine [name] by [name] East, RN Electronic Signature(s) Signed: 08/02/2022 11:33:58 AM By: [name] Maudlin MD FACS Signed: 08/02/2022 3:36:20 PM By: [name] East RN Entered By: [name] on 08/02/2022 11:22:28 -------------------------------------------------------------------------------- HPI Details Patient Name: Date of Service: [name]. 08/02/2022 10:45 A M Medical Record Number: [id number] Patient Account Number: [id number] Date of Birth/Sex: Treating RN: 01-17-1951 (72 y.o. M) Primary Care Provider: Jilda [name] Other Clinician: Referring Provider: Treating Provider/Extender: [name] in Treatment: 41 History of Present Illness HPI Description: 10/11/17; [name] is a 72 year old man who tells me that in 2015 he slipped down the latter traumatizing his left leg. He developed a wound in the same spot the area that we are currently looking at. He states this closed over for the most part although he always felt it was somewhat unstable. In 2016 he hit the same area with the door of his car had this reopened. He tells me that this is never really closed although sometimes an inflow it remains open on a constant basis. He has not been using any specific dressing to this except for topical antibiotics the nature of which were not really sure. His primary doctor did send him to see Dr. Einar [name] of interventional cardiology. He underwent an angiogram on 08/06/17 and he underwent a PTA and directional atherectomy of the lesser distal SFA and popliteal arteries which  resulted in brisk improvement in blood flow. It was noted that he had 2 vessel runoff through the anterior tibial and peroneal. He is also been to see vascular and interventional radiologist. He was not felt to have any significant superficial venous insufficiency. Presumably is not a candidate for any ablation. It  was suggested he come here for wound care. The patient is a type II diabetic on insulin. He also has a history of venous insufficiency. ABIs on the left were noncompressible in our clinic 10/21/17; patient we admitted to the clinic last week. 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 We put Iodosorb on him after an aggressive debridement and 3 layer compression. He complained of pain in his ankle and itching with is skin in fact he scratched the area on the medial calf superiorly at the rim of our wraps and he has 2 small open areas in that location today which are new. I changed his primary dressing today to silver collagen. As noted he is already had revascularization and does not have any significant superficial venous insufficiency that would be amenable to ablation 10/28/17; patient admitted to the clinic 2 weeks ago. He has a smaller Wound. Scratch injury from last week revealed. There is large wound over the tibial area. This is smaller. Granulation looks healthy. No need for debridement. 11/04/17; the wound on the left lateral calf looks better. Improved dimensions. Surface of this looks better. We've been maintaining him and Kerlix Coban wraps. He finds this much more comfortable. Silver collagen dressing 11/11/17; left lateral Wound continues to look healthy be making progress. Using a #5 curet I removed removed nonviable skin from the surface of the wound and then necrotic debris from the wound surface. Surface of the wound continues to look healthy. He also has an open area on the left great toenail bed. We've been  using topical antibiotics. 11/19/17; left anterior lateral wound continues to look healthy but it's not closed. He also had a small wound above this on the left leg Initially traumatic wounds in the setting of significant chronic venous insufficiency and stasis dermatitis 11/25/17; left anterior wounds superiorly is closed still a small wound inferiorly. 12/02/17; left anterior tibial area. Arrives today with adherent callus. Post debridement clearly not completely closed. Hydrofera Blue under 3 layer compression. 12/09/17; left anterior tibia. Circumferential eschar however the wound bed looks stable to improved. We've been using Hydrofera Blue under 3 layer compression 12/17/17; left anterior tibia. Apparently this was felt to be closed however when the wrap was taken off there is a skin tear to reopen wounds in the same area we've been using Hydrofera Blue under 3 layer compression 12/23/17 left anterior tibia. Not close to close this week apparently the [facility] was stuck to this again. Still circumferential eschar requiring debridement. I put a contact layer on this this time under the Hydrofera Blue 12/31/17; left anterior tibia. Wound is better slight amount of hyper-granulation. Using Hydrofera Blue over Adaptic. 01/07/18; left anterior tibia. The wound had some surface eschar however after this was removed he has no open wound.he was already revascularized by Dr. Einar [name] when he came to our clinic with atherectomy of the left SFA and popliteal artery. He was also sent to interventional radiology for venous reflux studies. He was not felt to have significant reflux but certainly has chronic venous changes of his skin with hemosiderin deposition around this area. He will definitely need to lubricate his skin and wear compression stocking and I've talked to him about this. READMISSION 05/26/2018 This is a now 72 year old man we cared for with traumatic wounds on his left anterior lower extremity. He  had been previously revascularized during that admission by Dr. Einar [name]. Apparently in follow-up Dr. Einar [name]  noted that he had deterioration in his arterial status. He underwent a stent placement in the distal left SFA on 04/22/2018. 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 He went back to the angiography suite on 04/30/2018 he underwent PTA and balloon angioplasty of the occluded left mid anterior tibial artery, thrombotic occlusion went from 100 to 0% which reconstitutes the posterior tibial artery. He had thrombectomy and aspiration of the peroneal artery. The stent placed in the distal SFA left SFA was still occluded. 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 He has been recuperating at [place] home in [facility] after the most recent hospitalization. He is going home tomorrow. He tells me that 2 weeks ago he traumatized the tip of his left fifth toe. He came in urgently for our review of this. This was a history of before I noted that Dr. Einar [name] had already noted dry gangrenous changes of the left fifth toe EARSEL, SHOUSE ([id number]) [phone].pdf Page 3 of 20 06/09/2018; 2-week follow-up. I did contact Dr. Einar [name] after his last appointment and he apparently saw 1 of Dr. Irven Shelling colleagues the next day. He does not follow-up with Dr. Einar [name] himself until Thursday of this week. 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 Nevertheless there is no evidence of infection no drainage and no pain. He had a new area that this week when we were signing him in today on the left anterior mid tibia area, this is in close proximity to the previous wound we have dealt with in this clinic. 06/23/2018;  2-week follow-up. I did not receive a recent note from Dr. Einar [name] to review today. Our office is trying to obtain this. He is apparently not planning to do further vascular interventions and wondered about compression to try and help with the patient's chronic venous insufficiency. However we are also concerned about the arterial flow. He arrives in clinic today with a new area on the left third toe. The areas on the calf/anterior tibia are close to closing. The left fifth toe is still mummified using Betadine. -In reviewing things with the patient he has what sounds like claudication with mild to moderate amount of activity. 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 I prescribed Levaquin over the phone while we attempted to arrange a plan of care. However the patient called yesterday to report he had low-grade fever and he came in today acutely. There is been a marked deterioration in the left third toe with spreading cellulitis up into the dorsal left foot. He was referred to the emergency room. Readmission: 06/29/2020 patient presents today for reevaluation here in our clinic he was previously treated by Dr. Dellia Nims at the latter part of 2019 in 2 the beginning of 2020. Subsequently we have not seen him since that time in the interim he did have evaluation with vein and vascular specialist specifically Dr. Anice Paganini who did perform quite extensive work for a left femoral to anterior tibial artery bypass. With that being said in the interim the patient has developed significant lymphedema and has wounds that he tells me have really never healed in regard to the incision site on the left leg. He  also has multiple wounds on the feet for various reasons some of which is that he tends to pick at his feet. Fortunately there is no signs of active infection systemically at this time he does have some wounds that are little bit deeper but most are fairly superficial he seems  to have good blood flow and overall everything appears to be healthy I see no bone exposed and no obvious signs of osteomyelitis. I do not know that he necessarily needs a x-ray at this point although that something we could consider depending on how things progress. The patient does have a history of lymphedema, diabetes, this is type II, chronic kidney disease stage III, hypertension, and history of peripheral vascular disease. 07/05/2020; patient admitted last week. Is a patient I remember from 2019 he had a spreading infection involving the left foot and we sent him to the hospital. 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 He subsequently had a left femoral to anterior tibial bypass by Dr.Cain vein and vascular. He also has severe lymphedema with chronic skin changes related to that on the left leg. The most problematic area that was new today was on the left medial great toe. This was apparently a small area last week there was purulent drainage which our intake nurse cultured. Also areas on the left medial foot and heel left lateral foot. He has 2 areas on the left medial calf left lateral calf in the setting of the severe lymphedema. 07/13/2020 on evaluation today patient appears to be doing better in my opinion compared to his last visit. The good news is there is no signs of active infection systemically and locally I do not see any signs of infection either. He did have an x-ray which was negative that is great news he had a culture which showed MRSA but at the same time he is been on the doxycycline which has helped. I do think we may want to extend this for 7 additional days 1/25; patient admitted to the clinic a few weeks ago. 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 We have been putting him under compression his edema control is a lot better but he is severe verricused skin on the left leg. He is really  done quite well he still has an open area on the left medial calf and the left medial first metatarsal head. We have been using silver collagen on the leg silver alginate on the foot 07/27/2020 upon evaluation today patient appears to be doing decently well in regard to his wounds. He still has a lot of dry skin on the left leg. Some of this is starting to peel back and I think he may be able to have them out by removing some that today. Fortunately there is no signs of active infection at this time on the left leg although on the right leg he does appear to have swelling and erythema as well as some mild warmth to touch. This does have been concerned about the possibility of cellulitis although within the differential diagnosis I do think that potentially a DVT has to be at least considered. We need to rule that out before proceeding would just call in the cellulitis. Especially since he is having pain in the posterior aspect of his calf muscle. 2/8; the patient had seen sparingly. He has severe skin changes of chronic lymphedema in the left leg thickened hyperkeratotic  verrucous skin. He has an open wound on the medial part of the left first met head left mid tibia. He also has a rim of nonepithelialized skin in the anterior mid tibia. He brought in the AmLactin lotion that was been prescribed although I am not sure under compression and its utility. There concern about cellulitis on the right lower leg the last time he was here. He was put on on antibiotics. His DVT rule out was negative. The right leg looks fine he is using his stocking on this area 08/10/2020 upon evaluation today patient appears to be doing well with regard to his leg currently. He has been tolerating the dressing changes without complication. Fortunately there is no signs of active infection which is great news. Overall very pleased with where things stand. 2/22; the patient still has an area on the medial part of the left first  met his head. This looks better than when I last saw this earlier this month he has a rim of epithelialization but still some surface debris. Mostly everything on the left leg is healed. There is still a vulnerable in the left mid tibia area. 08/30/2020 upon evaluation today patient appears to be doing much better in regard to his wounds on his foot. 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 Nonetheless I think we will address that today I Minna send in a prescription for him in that regard. Overall though there does not appear to be any signs of significant worsening. 09/07/2020 on evaluation today patient's wounds over his left foot appear to be doing excellent. I do not see any signs of infection there is some callus buildup this can require debridement for certain but overall I feel like he is managing quite nicely. He still using the AmLactin cream which has been beneficial for him as well. 3/22; left foot wound is closed. There is no open area here. He is using ammonium lactate lotion to the lower extremities to help exfoliate dry cracked skin. He has compression stockings from elastic therapy in [employer]. The wound on the medial part of his left first met head is healed today. READMISSION 04/12/2021 Mr. Swaggerty is a patient we know fairly well he had a prolonged stay in clinic in 2019 with wounds on his left lateral and left anterior lower extremity in the setting of chronic venous insufficiency. More recently he was here earlier this year with predominantly an area on his left foot first metatarsal head plantar and he says the plantar foot broke down on its not long after we discharged him but he did not come back here. The last few months areas of broken down on his left anterior and again the left lateral lower extremity. The leg itself is very swollen  chronically enlarged a lot of hyperkeratotic dry Berry Q skin in the left lower leg. His edema extends well into the thigh. He was seen by Dr. Donzetta Matters. He had ABIs on 03/02/2021 showing an ABI on the right of 1 with a TBI of 0.72 his ABI in the left at 1.09 TBI of 0.99. Monophasic and biphasic waveforms on the right. On the left monophasic waveforms were noted he went on to have an angiogram on 03/27/2021 this showed the aortic aortic and iliac segments were free of flow-limiting stenosis the left common femoral vein to evaluate the left femoral to  anterior tibial artery bypass was unobstructed the bypass was patent without any areas of stenosis. We discharged the patient in bilateral juxta lite stockings but very clearly that was not sufficient to control the swelling and maintain skin integrity. He is clearly going to need compression pumps. 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 This is fortunate because he is on his feet for long periods of time. 10/27; patient comes in with our intake nurse reporting copious amount of green drainage from the left anterior mid tibia the left dorsal foot and to a lesser extent the left medial mid tibia. We left the compression wrap on all week for the amount of edema in his left leg is quite a bit better. We use silver alginate as the primary dressing 11/3; edema control is good. Left anterior lower leg left medial lower leg and the plantar first metatarsal head. The left anterior lower leg required debridement. Deep tissue culture I did of this wound showed MRSA I put him on 10 days of doxycycline which she will start today. We have him in compression wraps. He [name], [name] ([id number]) [id number] Page 4 of 20 has a security card and [employer] however he is retiring on November 15. We will need to then get him into a better offloading boot for the left foot perhaps a total contact  cast 11/10; edema control is quite good. Left anterior and left medial lower leg wounds in the setting of chronic venous insufficiency and lymphedema. He also has a substantial area over the left plantar first metatarsal head. 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 He has significant hypergranulation on the left plantar foot wound. The patient is a diabetic but he does not have significant PAD 11/17; edema control is quite good. Left anterior and left medial lower leg wounds look better. The really concerning area remains the area on the left plantar first metatarsal head. He has a rim of epithelialization. 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 Starting today with a forefoot off loader but . possibly a total contact cast. 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 Arrives with a very significant odor from I think all of the wounds on the left leg and the left foot. Because of this I did not put a total contact cast on him today but will could still consider this. His wife was having cataract surgery which is the reason he missed the appointment 12/6. I saw this man 5 days ago with a swelling below the popliteal fossa. I thought he actually might have a Baker's cyst however the DVT rule out study that we could arrange right away was negative the technician told me this was not a ruptured Baker's cyst. We attempted to get this aspirated by under ultrasound guidance in interventional radiology however all they did was an ultrasound however it shows an extensive fluid collection 62 x 8 x 9.4 in the left thigh and left calf. The patient states he thinks this started 8 days ago or so but  he really is not  complaining of any pain, fever or systemic symptoms. He has not ha 12/20; after some difficulty I managed to get the patient into see Dr. Donzetta Matters. Eventually he was taken into the hospital and had a drain put in the fluid collection below his left knee posteriorly extending into the posterior thigh. He still has the drain in place. Culture of this showed moderate staff aureus few Morganella and few Klebsiella he is now on doxycycline and ciprofloxacin as suggested by infectious disease he is on this for a month. The drain will remain in place until it stops draining 12/29; he comes in today with the 1 wound on his left leg and the area on the left plantar first met head significantly smaller. Both look healthy. He still has the drain in the left leg. He says he has to change this daily. Follows up with Dr. Donzetta Matters on January 11. 06/29/2021; the wounds that I am following on the left leg and left first met head continued to be quite healthy. However the area where his inferior drain is in place had copious amounts of drainage which was green in color. The wound here is larger. Follows up with Dr. Gwenlyn Saran of vein and vascular his surgeon next week as well as infectious disease. He remains on ciprofloxacin and doxycycline. He is not complaining of excessive pain in either one of the drain areas 1/12; the patient saw vascular surgery and infectious disease. Vascular surgery has left the drain in place as there was still some notable drainage still see him back in 2 weeks. Dr. Velna Ochs stop the doxycycline and ciprofloxacin and I do not believe he follows up with them at this point. Culture I did last week showed both doxycycline resistant MRSA and Pseudomonas not sensitive to ciprofloxacin although only in rare titers 1/19; the patient's wound on the left anterior lower leg is just about healed. We have continued healing of the area that was medially on the left leg. Left first plantar metatarsal head  continues to get smaller. The major problem here is his 2 drain sites 1 on the left upper calf and lateral thigh. There is purulent drainage still from the left lateral thigh. I gave him antibiotics last week but we still have recultured. He has the drain in the area I think this is eventually going to have to come out. I suspect there will be a connecting wound to heal here perhaps with improved VAc 1/26; the patient had his drain removed by vein and vascular on 1/25/. This was a large pocket of fluid in his left thigh that seem to tunnel into his left upper calf. 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 His mention his Penrose drain was removed today. He now has a tunneling wound on his left calf and left thigh. Both of these probe widely towards each other although I cannot really prove that they connect. Both wounds on his lower leg anteriorly are closed and his area over the first metatarsal head on his right foot continues to improve. We are using Hydrofera Blue here. He also saw infectious disease culture of the abscess they noted was polymicrobial with MRSA, Morganella and Klebsiella he was treated with doxycycline and ciprofloxacin for 4 weeks ending on 07/03/2021. They did not recommend any further antibiotics. Notable that while he still had the Penrose drain in place last week he had purulent drainage coming out of the inferior IandD site this grew [facility], MRSA and Pseudomonas but  there does not appear to be any active infection in this area today with the drain out and he is not systemically unwell 2/2; with regards to the drain sites the superior one on the thigh actually is closed down the one on the upper left lateral calf measures about 8 and half centimeters which is an improvement seems to be less prominent although still with a lot of drainage. The only remaining wound is over the first metatarsal head on the left foot and this looks to be continuing to  improve with Hydrofera Blue. 2/9; the area on his plantar left foot continues to contract. Callus around the wound edge. The drain sites specifically have not come down in depth. We put the wound VAC on Monday he changed the canister late last night our intake nurse reported a pocket of fluid perhaps caused by our compression wraps 2/16; continued improvement in left foot plantar wound. drainage site in the calf is not improved in terms of depth (wound vac) 2/23; continued improvement in the left foot wound over the first metatarsal head. With regards to the drain sites the area on his thigh laterally is healed however the open area on his calf is small in terms of circumference by still probes in by about 15 cm. Within using the wound VAC. Hydrofera Blue on his foot 08/24/2021: The left first metatarsal head wound continues to improve. The wound bed is healthy with just some surrounding callus. Unfortunately the open drain site on his calf remains open and tunnels at least 15 cm (the extent of a Q-tip). This is despite several weeks of wound VAC treatment. Based on reading back through the notes, there has been really no significant change in the depth of the wound, although the orifice is smaller and the more cranial wound on his thigh has closed. I suspect the tunnel tracks nearly all the way to this location. 08/31/2021: Continued improvement in the left first metatarsal head wound. There has been absolutely no improvement to the long tunnel from his open drain site on his calf. We have tried to get him into see vascular surgery sooner to consider the possibility of simply filleting the tract open and allowing it to heal from the bottom up, likely with a wound VAC. They have not yet scheduled a sooner appointment than his current mid April 09/14/2021: He was seen by vascular surgery and they took him to the operating room last week. They opened a portion of the tunnel, but did not extend the entire  length of the known open subcutaneous tract. I read Dr. Claretha Cooper operative note and it is not clear from that documentation why only a portion of the tract was opened. The heaped up granulation tissue was curetted and removed from at least some portion of the tract. They did place a wound VAC and applied an Unna boot to the leg. The ulcer on his left first metatarsal head is smaller today. The bed looks good and there is just a small amount of surrounding callus. 09/21/2021: The ulcer on his left first metatarsal head looks to be stalled. There is some callus surrounding the wound but the wound bed itself does not appear particularly dynamic. The tunnel tract on his lateral left leg seems to be roughly the same length or perhaps slightly smaller but the wound bed appears healthy with good granulation tissue. He opened up a new wound on his medial thigh and the site of a prior surgical incision. He says that  he did this unconsciously in his sleep by scratching. 09/28/2021: Unfortunately, the ulcer on his left first metatarsal head has extended underneath the callus toward the dorsum of his foot. The medial thigh wounds are roughly the same. The tunnel on his lateral left leg continues to be problematic; it is longer than we are able to actually probe with a Q-tip. I am still not certain as to why Dr. Donzetta Matters did not open this up entirely when he took the patient to the operating room. We will likely be back in the same situation with just a small superficial opening in a long unhealed tract, as the open portion is granulating in nicely. [name], [name] ([id number]) [id number] Page 5 of 20 10/02/2021: The patient was initially scheduled for a nurse visit, but we are also applying a total contact cast today. The plantar foot wound looks clean without significant accumulated callus. We have been applying Prisma silver collagen to the site. 10/05/2021: The patient is here for his first  total contact cast change. We have tried using gauze packing strips in the tunnel on his lateral leg wound, but this does not seem to be working any better than the white VAC foam. The foot ulcer looks about the same with minimal periwound callus. Medial thigh wound is clean with just some overlying eschar. 10/12/2021: The plantar foot wound is stable without any significant accumulation of periwound callus. The surface is viable with good granulation tissue. The medial thigh wounds are much smaller and are epithelializing. On the other hand, he had purulent drainage coming from the tunnel on his lateral leg. He does go back to see Dr. Donzetta Matters next week and is planning to ask him why the wound tunnel was not completely opened at the time of his most recent operation. 10/19/2021: The plantar foot wound is markedly improved and has epithelial tissue coming through the surface. The medial thigh wounds are nearly closed with just a tiny open area. He did see Dr. Donzetta Matters earlier this week and apparently they did discuss the possibility of opening the sinus tract further and enabling a wound VAC application. Apparently there are some limits as to what Dr. 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 I think if we could get the tract open to the level of the popliteal fossa, this would greatly aid in her ability to get this chart closed. That being said, however, today when I probed the tract with a Q-tip, I was not able to insert the entirety of the Q-tip as I have on previous occasions. The tunnel is shorter by about 4 cm. The surface is clean with good granulation tissue and no further episodes of purulent drainage. 10/30/2021: Last week, the patient underwent surgery and had the long tract in his leg opened. There was a rind that was debrided, according to the operative report. His medial thigh ulcers are closed. The plantar foot wound is clean with a good surface and some built up  surrounding callus. 11/06/2021: The overall dimensions of the large wound on his lateral leg remain about the same, but there is good granulation tissue present and the tunneling is a little bit shorter. He has a new wound on his anterior tibial surface, in the same location where he had a similar lesion in the past. The plantar foot wound is clean with some buildup surrounding callus. Just toward the medial aspect of his foot, however, there is an area of darkening that once debrided, revealed  another opening in the skin surface. 11/13/2021: The anterior tibial surface wound is closed. The plantar foot wound has some surrounding callus buildup. The area of darkening that I debrided last week and revealed an opening in the skin surface has closed again. The tunnel in the large wound on his lateral leg has come in by about 3 cm. There is healthy granulation tissue on the entire wound surface. 11/23/2021: The patient was out of town last week and did wet-to-dry dressings on his large wound. 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 Unfortunately, he picked open the wound on his left medial thigh. He says that it was itching and he just could not stop scratching it until it was open again. The wound on his plantar foot is smaller and has not accumulated a tremendous amount of callus. The lateral leg wound is shallower and the tunnel has also decreased in depth. There is just a little bit of slough accumulation on the surface. 11/30/2021: Another portion of his left medial thigh has been opened up. All of these wounds are fairly superficial with just a little bit of slough and eschar accumulation. The wound on his plantar foot is almost closed with just a bit of eschar and periwound callus accumulation. The lateral leg wound is nearly flush with the surrounding skin and the tunnel is markedly shallower. 12/07/2021: There is just 1 open area on  his left medial thigh. It is clean with just a little bit of perimeter eschar. The wound on his plantar foot continues to contract and just has some eschar and periwound callus accumulation. The lateral leg wound is closing at the more distal aspect and the tunnel is smaller. The surface is nearly flush with the surrounding skin and it has a good bed of granulation tissue. 12/14/2021: The thigh and foot wounds are closed. The lateral leg wound has closed over approximately half of its length. The tunnel continues to contract and the surface is now flush with the surrounding skin. The wound bed has robust granulation tissue. 12/22/2021: The thigh and foot wounds have reopened. The foot wound has a lot of callus accumulation around and over it. The thigh wound is tiny with just a little bit of slough in the wound bed. The lateral leg wound continues to contract. His vascular surgeon took the wound VAC off earlier in the week and the patient has been doing wet-to-dry dressings. There is a little slough accumulation on the surface. The tunnel is about 3 cm in depth at this point. 12/28/2021: The thigh wound is closed again. The foot wound has some callus that subsequently has peeled back exposing just a small slit of a wound. The lateral leg wound Is down to about half the size that it originally was and the tunnel is down to about half a centimeter in depth. 01/04/2022: The thigh wound remains closed. The foot wound has heavy callus overlying the wound site. Once this was debrided, the wound was found to be closed. The lateral leg wound is smaller again this week and very superficial. No tunnel could be identified. 01/12/2022: The thigh and foot wounds both remain closed. The lateral leg wound is now nearly flush with the skin surface. There is good granulation tissue present with a light layer of slough. 01/19/2022: Due to the way his wrap was placed, the patient did not change the dressing on his thigh at all  and so  the foam was saturated and his skin is macerated. There is a light layer of slough on the wound surface. The underlying granulation tissue is robust and healthy-appearing. He has heavy callus buildup at the site of his first metatarsal head wound which is still healed. 02/01/2022: He has been in silver alginate. When he removed the dressing from his thigh wound, however, some leg, superficially reopening a portion of the wound that had healed. In addition, underneath the callus at his left first metatarsal head, there appears to be a blister and the wound appears to be open again. 02/08/2022: The lateral leg wound has contracted substantially. There is eschar and a light layer of slough present. He says that it is starting to pull and is uncomfortable. On inspection, there is some puckering of the scar and the eschar is quite dry; this may account for his symptoms. On his first metatarsal head, the wound is much smaller with just some eschar on the surface. The callus has not reaccumulated. He reports that he had a blister come up on his medial thigh wound at the distal aspect. It popped and there is now an opening in his skin again. Looking back through his Sun[facility] of wound photos, there is what looks like a permanent suture just deep to this location and it may be trying to erode through. We have been using silver alginate on his wounds. 02/15/2022: The lateral leg wound is about half the size it was last week. It is clean with just a little perimeter eschar and light slough. The wound on his first metatarsal head is about the same with heavy callus overlying it. The medial thigh wound is closed again. He does have some skin changes on the top of his foot that looks potentially yeast related. 02/22/2022: The skin on the top of his foot improved with the use of a topical antifungal. The lateral leg wound continues to contract and is again smaller this week. There is a little bit of slough and  eschar on the surface. The first metatarsal head wound is a little bit smaller but has reaccumulated a thick callus over the top. He decided to try to trim his toenail and ultimately took the entire nail off of his left great toe. 03/02/2022: His lateral leg wound continues to improve, as does the wound on his left great toe. Unfortunately, it appears that somehow his foot got wet and moisture seeped in through the opening causing his skin to lift. There is a large wound now overlying his first metatarsal on both the plantar, medial, and dorsal portion of his foot. There is necrotic tissue and slough present underneath the shaggy macerated skin. 03/08/2022: The lateral leg wound is smaller again today. There is just a light layer of slough and eschar on the surface. The great toe wound is smaller again today. The first metatarsal wound is a little bit smaller today and does not look nearly as necrotic and macerated. There is still slough and nonviable tissue present. 03/15/2022: The lateral leg wound is narrower and just has a little bit of light slough buildup. The first metatarsal wound still has a fair amount of moisture affecting the periwound skin. The great toe wound is healed. 03/22/2022: The lateral leg wound is now isolated to just at the level of his knee. There is some eschar and slough accumulation. The first metatarsal head [name], TAILOR ([id number]) [id number] Page 6 of 20 wound has epithelialized tremendously and is about half the size  that it was last week. He still has some maceration on the top of his foot and a fungal odor is present. 03/29/2022: T oday the patient's foot was macerated, suggesting that the cast got wet. The patient has also been picking at his dry skin and has enlarged the wound on his left lateral leg. In the time between having his cast removed and my evaluation, he had picked more dry skin and opened up additional wounds on his Achilles  area and dorsal foot. The plantar first metatarsal head wound, however, is smaller and clean with just macerated callus around the perimeter and light slough on the surface. The lateral leg wound measured a little bit larger but is also fairly clean with eschar and minimal slough. 04/02/2022: The patient had vascular studies done last Friday and so his cast was not applied. He is here today to have that done. 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 04/05/2022: Both wounds are smaller and quite clean. There is just a little biofilm on the lateral leg wound. 10/20; the patient has a wound on the left lateral surgical incision at the level of his lateral knee this looks clean and improved. He is using silver alginate. He also has an area on his left medial foot for which she is using Hydrofera Blue under a total contact cast both wounds are measuring smaller 04/20/2022: The plantar foot wound has contracted considerably and is very close to closing. The lateral leg wound was measured a little larger, but there was a tiny open area that was included in the measurements that was not included last week. He has some eschar around the perimeter but otherwise the wound looks clean. 04/27/2022: The lateral leg wound looks better this week. He says that midweek, he felt it was very dry and began applying hydrogel to the site. I think this was beneficial. The foot wound is nearly closed underneath a thick layer of dry skin and callus. 05/04/2022: The foot wound is healed. He has developed a new small ulcer on his anterior tibial surface about midway up his leg. It has a little slough on the surface. The lateral leg wound still is fairly dry, but clean with just a little biofilm on the surface. 05/11/2022: The wound on his foot reopened on Wednesday. A large blister formed which then broke open revealing the fat layer underneath. The ulcer on his anterior tibial surface is a little bit larger this  week. The lateral leg wound has much better moisture balance this week. Fortunately, prior to his foot wound reopening, he did get the cast made for his orthotic. 05/15/2022: Already, the left medial foot wound has improved. The tissue is less macerated and the surface is clean. The ulcer on his anterior tibial surface continues to enlarge. This seems likely secondary to accumulated moisture. The lateral leg wound continues to have an improved moisture balance with the use of collagen. 05/25/2022: The medial foot wound continues to contract. It is now substantially smaller with just a little slough on the surface. The anterior tibial surface wound continues to enlarge further. Once again, this seems to be secondary to moisture. The lateral leg wound does not seem to be changing much in size, but the moisture balance is better. 06/01/2022: The anterior tibial wound is closed. The medial foot wound is down to just a very small, couple of millimeters, opening. The lateral leg wound has good moisture balance, but remains unchanged in size. 12/15; the patient's anterior  tibial wound has reopened, however the area on his right first metatarsal head is closed. The major wound is actually on the superior part of his surgical wound in the left lateral thigh. Not a completely viable surface under illumination. This may at some point require a debridement I think he is currently using Prisma. As noted the left medial foot wound has closed 06/14/2022: The anterior tibial wound has closed. The lateral leg wound has a better surface but is basically unchanged in size. The left medial foot wound has reopened. It looks as though there was some callus accumulation and moisture got under the callus which caused the tissue to break down again. 06/21/2022: A new wound has opened up just distal to the previous anterior tibial wound. It is small but has hypertrophic granulation tissue present. The lateral leg wound is a  little bit narrower and has a layer of slough on the surface. The left medial foot wound is down to just a pinhole. His custom orthotics should be available next week. 06/28/2022: The wound on his first metatarsal head has healed. He has developed a new small wound on his medial lower leg, in an old scar site. The lateral leg wound continues to contract but continues to accumulate slough, as well. 07/03/2022: Despite wearing his custom orthopedic shoes, he managed to reopen the wound on his first metatarsal head. He says he thinks his foot got wet and then some skin lifted up and he peeled this away. Both of the lower leg wounds are smaller and have some dry eschar on the surface. The lateral leg wound is quite a bit narrower today. 07/12/2022: The medial lower leg wound is closed. The anterior lower leg wound has contracted considerably. The lateral upper leg wound is narrower with a layer of slough on the surface. The first metatarsal head wound is also smaller, but had copious drainage which saturated the foam border dressing and resulted in some periwound tissue maceration. Fortunately there was no breakdown at this site. 07/19/2022: The lower leg shows signs of significant maceration; I think he must be sweating excessively inside his cast. There are several areas of skin breakdown present. The wound on his foot is smaller and that on his lateral leg is narrower and is shorter by about a centimeter. 07/26/2022: Last week we used a zinc Coflex wrap prior to applying his total contact cast and this has had the effect of keeping his skin from getting macerated this week. The anterior leg wound has epithelialized substantially. The lateral leg wound is significantly smaller with just a bit of slough on the surface. The first metatarsal head wound is also smaller this week. 08/02/2022: The anterior leg wound was closed on arrival, but while he was sitting in the room, he picked it open again. The lateral leg  wound is smaller with just a little slough on the surface and the first metatarsal head wound has contracted further, as well. Electronic Signature(s) Signed: 08/02/2022 11:24:29 AM By: [name] Maudlin MD FACS Entered By: [name] on 08/02/2022 11:24:29 Physical Exam Details -------------------------------------------------------------------------------- [name] ([id number]) [id number] Page 7 of 20 Patient Name: Date of Service: SIRR, KABEL 08/02/2022 10:45 A M Medical Record Number: [id number] Patient Account Number: [id number] Date of Birth/Sex: Treating RN: 1951-06-22 (72 y.o. M) Primary Care Provider: Jilda [name] Other Clinician: Referring Provider: Treating Provider/Extender: [name] in Treatment: 67 Constitutional Hypertensive, asymptomatic. . . . no acute distress. Respiratory Normal work of breathing  on room air. Notes 08/02/2022: The anterior leg wound was closed on arrival, but while he was sitting in the room, he picked it open again. The lateral leg wound is smaller with just a little slough on the surface and the first metatarsal head wound has contracted further, as well. Electronic Signature(s) Signed: 08/02/2022 11:24:59 AM By: [name] Maudlin MD FACS Entered By: [name] on 08/02/2022 11:24:58 -------------------------------------------------------------------------------- Physician Orders Details Patient Name: Date of Service: [name]. 08/02/2022 10:45 A M Medical Record Number: [id number] Patient Account Number: [id number] Date of Birth/Sex: Treating RN: [birth year] (72 y.o. [name] Primary Care Provider: Jilda [name] Other Clinician: Referring Provider: Treating Provider/Extender: [name] in Treatment: 76 Verbal / Phone Orders: No Diagnosis Coding ICD-10 Coding Code Description L97.528 Non-pressure chronic ulcer of other part of left foot  with other specified severity L97.828 Non-pressure chronic ulcer of other part of left lower leg with other specified severity E11.621 Type 2 diabetes mellitus with foot ulcer E11.51 Type 2 diabetes mellitus with diabetic peripheral angiopathy without gangrene I89.0 Lymphedema, not elsewhere classified I87.322 Chronic venous hypertension (idiopathic) with inflammation of left lower extremity Follow-up Appointments ppointment in 1 week. - Dr [name] - Room 2 Return A Thursday 08/02/22 at 10:45am Anesthetic Wound #22 Left,Proximal,Lateral Lower Leg (In clinic) Topical Lidocaine 4% applied to wound bed Bathing/ Shower/ Hygiene May shower with protection but do not get wound dressing(s) wet. Protect dressing(s) with water repellant cover (for example, large plastic bag) or a cast cover and may then take shower. Edema Control - Lymphedema / SCD / Other Avoid standing for long periods of time. Patient to wear own compression stockings every day. - on right leg; Moisturize legs daily. Compression stocking or Garment 20-30 mm/Hg pressure to: - left leg daily Off-Loading Total Contact Cast to Left Lower Extremity Other: - minimal weight bearing left foot Wound Treatment JOAQUIN, KNEBEL ([id number]) [id number] Page 8 of 20 Wound #18R - Metatarsal head first Wound Laterality: Plantar, Left Cleanser: Soap and Water 1 x Per Week/30 Days Discharge Instructions: May shower and wash wound with dial antibacterial soap and water prior to dressing change. Cleanser: Wound Cleanser 1 x Per Week/30 Days Discharge Instructions: Cleanse the wound with wound cleanser prior to applying a clean dressing using gauze sponges, not tissue or cotton balls. Prim Dressing: Sorbalgon AG Dressing 2x2 (in/in) 1 x Per Week/30 Days ary Discharge Instructions: Apply to wound bed as instructed Secondary Dressing: Zetuvit Plus Silicone Border Dressing 4x4 (in/in) 1 x Per Week/30 Days Discharge  Instructions: Apply silicone border over primary dressing as directed. Wound #22 - Lower Leg Wound Laterality: Left, Lateral, Proximal Cleanser: Soap and Water 3 x Per Week/30 Days Discharge Instructions: May shower and wash wound with dial antibacterial soap and water prior to dressing change. Cleanser: Wound Cleanser 3 x Per Week/30 Days Discharge Instructions: Cleanse the wound with wound cleanser prior to applying a clean dressing using gauze sponges, not tissue or cotton balls. Peri-Wound Care: Sween Lotion (Moisturizing lotion) 3 x Per Week/30 Days Discharge Instructions: Apply moisturizing lotion to the leg Topical: Skintegrity Hydrogel 4 (oz) 3 x Per Week/30 Days Discharge Instructions: Apply hydrogel as directed Prim Dressing: Endoform 2x2 in 3 x Per Week/30 Days ary Discharge Instructions: Moisten with Hydrogel or saline Secondary Dressing: ABD Pad, 8x10 (Generic) 3 x Per Week/30 Days Discharge Instructions: Apply over primary dressing as directed. Secured With: Elastic Bandage 4 inch (ACE bandage) 3 x Per Week/30 Days Discharge Instructions:  Secure with ACE bandage as directed. Wound #30 - Lower Leg Wound Laterality: Left, Anterior Cleanser: Soap and Water 1 x Per Week/30 Days Discharge Instructions: May shower and wash wound with dial antibacterial soap and water prior to dressing change. Cleanser: Wound Cleanser 1 x Per Week/30 Days Discharge Instructions: Cleanse the wound with wound cleanser prior to applying a clean dressing using gauze sponges, not tissue or cotton balls. Prim Dressing: Sorbalgon AG Dressing 2x2 (in/in) 1 x Per Week/30 Days ary Discharge Instructions: Apply to wound bed as instructed Secondary Dressing: Zetuvit Plus Silicone Border Dressing 4x4 (in/in) 1 x Per Week/30 Days Discharge Instructions: Apply silicone border over primary dressing as directed. Compression Wrap: CoFlex TLC Zinc 1 x Per Week/30 Days Electronic Signature(s) Signed: 08/02/2022  11:55:35 AM By: [name] East RN Signed: 08/02/2022 12:13:38 PM By: [name] Maudlin MD FACS Entered By: [name] on 08/02/2022 11:55:32 -------------------------------------------------------------------------------- Problem List Details Patient Name: Date of Service: [name]. 08/02/2022 10:45 A M Medical Record Number: [id number] Patient Account Number: [id number] Date of Birth/Sex: Treating RN: Feb 19, 1951 (72 y.o. M) Primary Care Provider: Jilda [name] Other Clinician: Referring Provider: Treating Provider/Extender: [name] in Treatment: [address] [name], [name] ([id number]) [id number] Page 9 of 20 ICD-10 Encounter Code Description Active Date MDM Diagnosis L97.528 Non-pressure chronic ulcer of other part of left foot with other specified 04/12/2021 No Yes severity L97.828 Non-pressure chronic ulcer of other part of left lower leg with other specified 04/12/2021 No Yes severity E11.621 Type 2 diabetes mellitus with foot ulcer 04/12/2021 No Yes E11.51 Type 2 diabetes mellitus with diabetic peripheral angiopathy without gangrene 04/12/2021 No Yes I89.0 Lymphedema, not elsewhere classified 04/12/2021 No Yes I87.322 Chronic venous hypertension (idiopathic) with inflammation of left lower 04/12/2021 No Yes extremity Inactive Problems ICD-10 Code Description Active Date Inactive Date L97.828 Non-pressure chronic ulcer of other part of left lower leg with other specified severity 06/08/2022 06/08/2022 E11.42 Type 2 diabetes mellitus with diabetic polyneuropathy 04/12/2021 04/12/2021 L02.416 Cutaneous abscess of left lower limb 06/13/2021 06/13/2021 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 07/20/2021 07/20/2021 Resolved Problems Electronic Signature(s) Signed: 08/02/2022 11:22:15 AM By: [name] Maudlin MD FACS Entered By: [name] on 08/02/2022  11:22:15 -------------------------------------------------------------------------------- Progress Note Details Patient Name: Date of Service: [name]. 08/02/2022 10:45 A M Medical Record Number: [id number] Patient Account Number: [id number] Date of Birth/Sex: Treating RN: [birth year] (72 y.o. M) Primary Care Provider: Jilda [name] Other Clinician: Referring Provider: Treating Provider/Extender: [name] in Treatment: [address] Subjective Chief Complaint [name] ([id number]) [id number] Page 10 of 20 Information obtained from Patient Left leg and foot ulcers 04/12/2021; patient is here for wounds on his left lower leg and left plantar foot over the first metatarsal head History of Present Illness (HPI) 10/11/17; Mr. Okuda is a 72 year old man who tells me that in 2015 he slipped down the latter traumatizing his left leg. He developed a wound in the same spot the area that we are currently looking at. He states this closed over for the most part although he always felt it was somewhat unstable. In 2016 he hit the same area with the door of his car had this reopened. He tells me that this is never really closed although sometimes an inflow it remains open on a constant basis. He has not been using any specific dressing to this except for topical antibiotics the nature of which were not really sure. His primary  doctor did send him to see Dr. Einar [name] of interventional cardiology. He underwent an angiogram on 08/06/17 and he underwent a PTA and directional atherectomy of the lesser distal SFA and popliteal arteries which resulted in brisk improvement in blood flow. It was noted that he had 2 vessel runoff through the anterior tibial and peroneal. He is also been to see vascular and interventional radiologist. He was not felt to have any significant superficial venous insufficiency. Presumably is not a candidate for any ablation. It was  suggested he come here for wound care. The patient is a type II diabetic on insulin. He also has a history of venous insufficiency. ABIs on the left were noncompressible in our clinic 10/21/17; patient we admitted to the clinic last week. 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 We put Iodosorb on him after an aggressive debridement and 3 layer compression. He complained of pain in his ankle and itching with is skin in fact he scratched the area on the medial calf superiorly at the rim of our wraps and he has 2 small open areas in that location today which are new. I changed his primary dressing today to silver collagen. As noted he is already had revascularization and does not have any significant superficial venous insufficiency that would be amenable to ablation 10/28/17; patient admitted to the clinic 2 weeks ago. He has a smaller Wound. Scratch injury from last week revealed. There is large wound over the tibial area. This is smaller. Granulation looks healthy. No need for debridement. 11/04/17; the wound on the left lateral calf looks better. Improved dimensions. Surface of this looks better. We've been maintaining him and Kerlix Coban wraps. He finds this much more comfortable. Silver collagen dressing 11/11/17; left lateral Wound continues to look healthy be making progress. Using a #5 curet I removed removed nonviable skin from the surface of the wound and then necrotic debris from the wound surface. Surface of the wound continues to look healthy. ooHe also has an open area on the left great toenail bed. We've been using topical antibiotics. 11/19/17; left anterior lateral wound continues to look healthy but it's not closed. ooHe also had a small wound above this on the left leg ooInitially traumatic wounds in the setting of significant chronic venous insufficiency and stasis dermatitis 11/25/17; left anterior wounds superiorly  is closed still a small wound inferiorly. 12/02/17; left anterior tibial area. Arrives today with adherent callus. Post debridement clearly not completely closed. Hydrofera Blue under 3 layer compression. 12/09/17; left anterior tibia. Circumferential eschar however the wound bed looks stable to improved. We've been using Hydrofera Blue under 3 layer compression 12/17/17; left anterior tibia. Apparently this was felt to be closed however when the wrap was taken off there is a skin tear to reopen wounds in the same area we've been using Hydrofera Blue under 3 layer compression 12/23/17 left anterior tibia. Not close to close this week apparently the [facility] was stuck to this again. Still circumferential eschar requiring debridement. I put a contact layer on this this time under the Hydrofera Blue 12/31/17; left anterior tibia. Wound is better slight amount of hyper-granulation. Using Hydrofera Blue over Adaptic. 01/07/18; left anterior tibia. The wound had some surface eschar however after this was removed he has no open wound.he was already revascularized by Dr. Einar [name] when he came to our clinic with atherectomy of the left SFA and popliteal artery. He was also  sent to interventional radiology for venous reflux studies. He was not felt to have significant reflux but certainly has chronic venous changes of his skin with hemosiderin deposition around this area. He will definitely need to lubricate his skin and wear compression stocking and I've talked to him about this. READMISSION 05/26/2018 This is a now 72 year old man we cared for with traumatic wounds on his left anterior lower extremity. He had been previously revascularized during that admission by Dr. Einar [name]. Apparently in follow-up Dr. Einar [name] noted that he had deterioration in his arterial status. He underwent a stent placement in the distal left SFA on 04/22/2018. 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 He went back to the  angiography suite on 04/30/2018 he underwent PTA and balloon angioplasty of the occluded left mid anterior tibial artery, thrombotic occlusion went from 100 to 0% which reconstitutes the posterior tibial artery. He had thrombectomy and aspiration of the peroneal artery. The stent placed in the distal SFA left SFA was still occluded. 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 He has been recuperating at [place] home in [facility] after the most recent hospitalization. He is going home tomorrow. He tells me that 2 weeks ago he traumatized the tip of his left fifth toe. He came in urgently for our review of this. This was a history of before I noted that Dr. Einar [name] had already noted dry gangrenous changes of the left fifth toe 06/09/2018; 2-week follow-up. I did contact Dr. Einar [name] after his last appointment and he apparently saw 1 of Dr. Irven Shelling colleagues the next day. He does not follow-up with Dr. Einar [name] himself until Thursday of this week. 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 Nevertheless there is no evidence of infection no drainage and no pain. He had a new area that this week when we were signing him in today on the left anterior mid tibia area, this is in close proximity to the previous wound we have dealt with in this clinic. 06/23/2018; 2-week follow-up. I did not receive a recent note from Dr. Einar [name] to review today. Our office is trying to obtain this. He is apparently not planning to do further vascular interventions and wondered about compression to try and help with the patient's chronic venous insufficiency. However we are also concerned about the arterial flow. ooHe arrives in clinic today with a new area on the  left third toe. The areas on the calf/anterior tibia are close to closing. The left fifth toe is still mummified using Betadine. -In reviewing things with the patient he has what sounds like claudication with mild to moderate amount of activity. 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 I prescribed Levaquin over the phone while we attempted to arrange a plan of care. However the patient called yesterday to report he had low-grade fever and he came in today acutely. There is been a marked deterioration in the left third toe with spreading cellulitis up into the dorsal left foot. He was referred to the emergency room. Readmission: 06/29/2020 patient presents today for reevaluation here in our clinic he was previously treated by Dr. Dellia Nims at the latter part of 2019 in 2 the  beginning of 2020. Subsequently we have not seen him since that time in the interim he did have evaluation with vein and vascular specialist specifically Dr. Anice Paganini who did perform quite extensive work for a left femoral to anterior tibial artery bypass. With that being said in the interim the patient has developed significant lymphedema and has wounds that he tells me have really never healed in regard to the incision site on the left leg. He also has multiple wounds on the feet for various reasons some of which is that he tends to pick at his feet. Fortunately there is no signs of active infection systemically at this time he does have some wounds that are little bit deeper but most are fairly superficial he seems to have good blood flow and overall everything appears to be healthy I see no bone exposed and no obvious signs of osteomyelitis. I do not know that he necessarily needs a x-ray at this point although that something we could consider depending on how things progress. The patient does have a history of lymphedema, diabetes, this is type II, chronic kidney disease stage III,  hypertension, and history of peripheral vascular disease. 07/05/2020; patient admitted last week. Is a patient I remember from 2019 he had a spreading infection involving the left foot and we sent him to the hospital. 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 He subsequently had a left femoral to anterior tibial bypass by Dr.Cain vein and vascular. He also has severe lymphedema with chronic skin changes related to that on the left leg. EIVIN, MASCIO ([id number]) [id number] Page 11 of 20 The most problematic area that was new today was on the left medial great toe. This was apparently a small area last week there was purulent drainage which our intake nurse cultured. Also areas on the left medial foot and heel left lateral foot. He has 2 areas on the left medial calf left lateral calf in the setting of the severe lymphedema. 07/13/2020 on evaluation today patient appears to be doing better in my opinion compared to his last visit. The good news is there is no signs of active infection systemically and locally I do not see any signs of infection either. He did have an x-ray which was negative that is great news he had a culture which showed MRSA but at the same time he is been on the doxycycline which has helped. I do think we may want to extend this for 7 additional days 1/25; patient admitted to the clinic a few weeks ago. 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 We have been putting him under compression his edema control is a lot better but he is severe verricused skin on the left leg. He is really done quite well he still has an open area on the left medial calf and the left medial first metatarsal head. We have been using silver collagen on the leg silver alginate on the foot 07/27/2020 upon evaluation today patient appears to be doing decently well in regard to his wounds. He still has  a lot of dry skin on the left leg. Some of this is starting to peel back and I think he may be able to have them out by removing some that today. Fortunately there is no signs of active infection at this time on the left leg although on the right leg he does appear to have  swelling and erythema as well as some mild warmth to touch. This does have been concerned about the possibility of cellulitis although within the differential diagnosis I do think that potentially a DVT has to be at least considered. We need to rule that out before proceeding would just call in the cellulitis. Especially since he is having pain in the posterior aspect of his calf muscle. 2/8; the patient had seen sparingly. He has severe skin changes of chronic lymphedema in the left leg thickened hyperkeratotic verrucous skin. He has an open wound on the medial part of the left first met head left mid tibia. He also has a rim of nonepithelialized skin in the anterior mid tibia. He brought in the AmLactin lotion that was been prescribed although I am not sure under compression and its utility. There concern about cellulitis on the right lower leg the last time he was here. He was put on on antibiotics. His DVT rule out was negative. The right leg looks fine he is using his stocking on this area 08/10/2020 upon evaluation today patient appears to be doing well with regard to his leg currently. He has been tolerating the dressing changes without complication. Fortunately there is no signs of active infection which is great news. Overall very pleased with where things stand. 2/22; the patient still has an area on the medial part of the left first met his head. This looks better than when I last saw this earlier this month he has a rim of epithelialization but still some surface debris. Mostly everything on the left leg is healed. There is still a vulnerable in the left mid tibia area. 08/30/2020 upon evaluation today patient appears to be  doing much better in regard to his wounds on his foot. 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 Nonetheless I think we will address that today I Minna send in a prescription for him in that regard. Overall though there does not appear to be any signs of significant worsening. 09/07/2020 on evaluation today patient's wounds over his left foot appear to be doing excellent. I do not see any signs of infection there is some callus buildup this can require debridement for certain but overall I feel like he is managing quite nicely. He still using the AmLactin cream which has been beneficial for him as well. 3/22; left foot wound is closed. There is no open area here. He is using ammonium lactate lotion to the lower extremities to help exfoliate dry cracked skin. He has compression stockings from elastic therapy in [place]. The wound on the medial part of his left first met head is healed today. READMISSION 04/12/2021 Mr. Israelson is a patient we know fairly well he had a prolonged stay in clinic in 2019 with wounds on his left lateral and left anterior lower extremity in the setting of chronic venous insufficiency. More recently he was here earlier this year with predominantly an area on his left foot first metatarsal head plantar and he says the plantar foot broke down on its not long after we discharged him but he did not come back here. The last few months areas of broken down on his left anterior and again the left lateral lower extremity. The leg itself is very swollen chronically enlarged a lot of hyperkeratotic dry Berry Q skin in the left lower leg. His edema  extends well into the thigh. He was seen by Dr. Donzetta Matters. He had ABIs on 03/02/2021 showing an ABI on the right of 1 with a TBI of 0.72 his ABI in the left at 1.09 TBI of 0.99. Monophasic and biphasic waveforms on  the right. On the left monophasic waveforms were noted he went on to have an angiogram on 03/27/2021 this showed the aortic aortic and iliac segments were free of flow-limiting stenosis the left common femoral vein to evaluate the left femoral to anterior tibial artery bypass was unobstructed the bypass was patent without any areas of stenosis. We discharged the patient in bilateral juxta lite stockings but very clearly that was not sufficient to control the swelling and maintain skin integrity. He is clearly going to need compression pumps. 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 This is fortunate because he is on his feet for long periods of time. 10/27; patient comes in with our intake nurse reporting copious amount of green drainage from the left anterior mid tibia the left dorsal foot and to a lesser extent the left medial mid tibia. We left the compression wrap on all week for the amount of edema in his left leg is quite a bit better. We use silver alginate as the primary dressing 11/3; edema control is good. Left anterior lower leg left medial lower leg and the plantar first metatarsal head. The left anterior lower leg required debridement. Deep tissue culture I did of this wound showed MRSA I put him on 10 days of doxycycline which she will start today. We have him in compression wraps. He has a security card and [employer] however he is retiring on November 15. We will need to then get him into a better offloading boot for the left foot perhaps a total contact cast 11/10; edema control is quite good. Left anterior and left medial lower leg wounds in the setting of chronic venous insufficiency and lymphedema. He also has a substantial area over the left plantar first metatarsal head. 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 He has significant  hypergranulation on the left plantar foot wound. The patient is a diabetic but he does not have significant PAD 11/17; edema control is quite good. Left anterior and left medial lower leg wounds look better. The really concerning area remains the area on the left plantar first metatarsal head. He has a rim of epithelialization. 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 Starting today with a forefoot off loader but . possibly a total contact cast. 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 Arrives with a very significant odor from I think all of the wounds on the left leg and the left foot. Because of this I did not put a total contact cast on him today but will could still consider this. His wife was having cataract surgery which is the reason he missed the appointment 12/6. I saw this man 5 days ago with a swelling below the popliteal fossa. I thought he actually might have a Baker's cyst however the DVT rule  out study that we could arrange right away was negative the technician told me this was not a ruptured Baker's cyst. We attempted to get this aspirated by under ultrasound guidance in interventional radiology however all they did was an ultrasound however it shows an extensive fluid collection 62 x 8 x 9.4 in the left thigh and left calf. The patient states he thinks this started 8 days ago or so but he really is not complaining of any pain, fever or systemic symptoms. He has not ha 12/20; after some difficulty I managed to get the patient into see Dr. Donzetta Matters. Eventually he was taken into the hospital and had a drain put in the fluid collection below his left knee posteriorly extending into the posterior thigh. He still has the drain in place. Culture of this showed moderate  staff aureus few Morganella and few Klebsiella he is now on doxycycline and ciprofloxacin as suggested by infectious disease he is on this for a month. The drain will remain in place until it stops draining 12/29; he comes in today with the 1 wound on his left leg and the area on the left plantar first met head significantly smaller. Both look healthy. He still has the drain in the left leg. He says he has to change this daily. Follows up with Dr. Donzetta Matters on January 11. ADONI, GREENOUGH ([id number]) [id number] Page 12 of 20 06/29/2021; the wounds that I am following on the left leg and left first met head continued to be quite healthy. However the area where his inferior drain is in place had copious amounts of drainage which was green in color. The wound here is larger. Follows up with Dr. Gwenlyn Saran of vein and vascular his surgeon next week as well as infectious disease. He remains on ciprofloxacin and doxycycline. He is not complaining of excessive pain in either one of the drain areas 1/12; the patient saw vascular surgery and infectious disease. Vascular surgery has left the drain in place as there was still some notable drainage still see him back in 2 weeks. Dr. Velna Ochs stop the doxycycline and ciprofloxacin and I do not believe he follows up with them at this point. Culture I did last week showed both doxycycline resistant MRSA and Pseudomonas not sensitive to ciprofloxacin although only in rare titers 1/19; the patient's wound on the left anterior lower leg is just about healed. We have continued healing of the area that was medially on the left leg. Left first plantar metatarsal head continues to get smaller. The major problem here is his 2 drain sites 1 on the left upper calf and lateral thigh. There is purulent drainage still from the left lateral thigh. I gave him antibiotics last week but we still have recultured. He has the drain in the area I think this is eventually  going to have to come out. I suspect there will be a connecting wound to heal here perhaps with improved VAc 1/26; the patient had his drain removed by vein and vascular on 1/25/. This was a large pocket of fluid in his left thigh that seem to tunnel into his left upper calf. 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 His mention his Penrose drain was removed today. He now has a tunneling wound on his left calf and left thigh. Both of these probe widely towards each other although I cannot really prove that they connect. Both wounds on his lower leg anteriorly are closed and his area over  the first metatarsal head on his right foot continues to improve. We are using Hydrofera Blue here. He also saw infectious disease culture of the abscess they noted was polymicrobial with MRSA, Morganella and Klebsiella he was treated with doxycycline and ciprofloxacin for 4 weeks ending on 07/03/2021. They did not recommend any further antibiotics. Notable that while he still had the Penrose drain in place last week he had purulent drainage coming out of the inferior IandD site this grew [facility] [place] [facility], MRSA and Pseudomonas but there does not appear to be any active infection in this area today with the drain out and he is not systemically unwell 2/2; with regards to the drain sites the superior one on the thigh actually is closed down the one on the upper left lateral calf measures about 8 and half centimeters which is an improvement seems to be less prominent although still with a lot of drainage. The only remaining wound is over the first metatarsal head on the left foot and this looks to be continuing to improve with Hydrofera Blue. 2/9; the area on his plantar left foot continues to contract. Callus around the wound edge. The drain sites specifically have not come down in depth. We put the wound VAC on Monday he changed the canister late last night our intake nurse reported a pocket of fluid  perhaps caused by our compression wraps 2/16; continued improvement in left foot plantar wound. drainage site in the calf is not improved in terms of depth (wound vac) 2/23; continued improvement in the left foot wound over the first metatarsal head. With regards to the drain sites the area on his thigh laterally is healed however the open area on his calf is small in terms of circumference by still probes in by about 15 cm. Within using the wound VAC. Hydrofera Blue on his foot 08/24/2021: The left first metatarsal head wound continues to improve. The wound bed is healthy with just some surrounding callus. Unfortunately the open drain site on his calf remains open and tunnels at least 15 cm (the extent of a Q-tip). This is despite several weeks of wound VAC treatment. Based on reading back through the notes, there has been really no significant change in the depth of the wound, although the orifice is smaller and the more cranial wound on his thigh has closed. I suspect the tunnel tracks nearly all the way to this location. 08/31/2021: Continued improvement in the left first metatarsal head wound. There has been absolutely no improvement to the long tunnel from his open drain site on his calf. We have tried to get him into see vascular surgery sooner to consider the possibility of simply filleting the tract open and allowing it to heal from the bottom up, likely with a wound VAC. They have not yet scheduled a sooner appointment than his current mid April 09/14/2021: He was seen by vascular surgery and they took him to the operating room last week. They opened a portion of the tunnel, but did not extend the entire length of the known open subcutaneous tract. I read Dr. Claretha Cooper operative note and it is not clear from that documentation why only a portion of the tract was opened. The heaped up granulation tissue was curetted and removed from at least some portion of the tract. They did place a wound VAC and  applied an Unna boot to the leg. The ulcer on his left first metatarsal head is smaller today. The bed looks good and there is  just a small amount of surrounding callus. 09/21/2021: The ulcer on his left first metatarsal head looks to be stalled. There is some callus surrounding the wound but the wound bed itself does not appear particularly dynamic. The tunnel tract on his lateral left leg seems to be roughly the same length or perhaps slightly smaller but the wound bed appears healthy with good granulation tissue. He opened up a new wound on his medial thigh and the site of a prior surgical incision. He says that he did this unconsciously in his sleep by scratching. 09/28/2021: Unfortunately, the ulcer on his left first metatarsal head has extended underneath the callus toward the dorsum of his foot. The medial thigh wounds are roughly the same. The tunnel on his lateral left leg continues to be problematic; it is longer than we are able to actually probe with a Q-tip. I am still not certain as to why Dr. Donzetta Matters did not open this up entirely when he took the patient to the operating room. We will likely be back in the same situation with just a small superficial opening in a long unhealed tract, as the open portion is granulating in nicely. 10/02/2021: The patient was initially scheduled for a nurse visit, but we are also applying a total contact cast today. The plantar foot wound looks clean without significant accumulated callus. We have been applying Prisma silver collagen to the site. 10/05/2021: The patient is here for his first total contact cast change. We have tried using gauze packing strips in the tunnel on his lateral leg wound, but this does not seem to be working any better than the white VAC foam. The foot ulcer looks about the same with minimal periwound callus. Medial thigh wound is clean with just some overlying eschar. 10/12/2021: The plantar foot wound is stable without any significant  accumulation of periwound callus. The surface is viable with good granulation tissue. The medial thigh wounds are much smaller and are epithelializing. On the other hand, he had purulent drainage coming from the tunnel on his lateral leg. He does go back to see Dr. Donzetta Matters next week and is planning to ask him why the wound tunnel was not completely opened at the time of his most recent operation. 10/19/2021: The plantar foot wound is markedly improved and has epithelial tissue coming through the surface. The medial thigh wounds are nearly closed with just a tiny open area. He did see Dr. Donzetta Matters earlier this week and apparently they did discuss the possibility of opening the sinus tract further and enabling a wound VAC application. Apparently there are some limits as to what Dr. 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 I think if we could get the tract open to the level of the popliteal fossa, this would greatly aid in her ability to get this chart closed. That being said, however, today when I probed the tract with a Q-tip, I was not able to insert the entirety of the Q-tip as I have on previous occasions. The tunnel is shorter by about 4 cm. The surface is clean with good granulation tissue and no further episodes of purulent drainage. 10/30/2021: Last week, the patient underwent surgery and had the long tract in his leg opened. There was a rind that was debrided, according to the operative report. His medial thigh ulcers are closed. The plantar foot wound is clean with a good surface and some built up surrounding callus. 11/06/2021: The overall dimensions of the large  wound on his lateral leg remain about the same, but there is good granulation tissue present and the tunneling is a little bit shorter. He has a new wound on his anterior tibial surface, in the same location where he had a similar lesion in the past. The plantar foot wound is clean with some buildup  surrounding callus. Just toward the medial aspect of his foot, however, there is an area of darkening that once debrided, revealed another opening in the skin surface. 11/13/2021: The anterior tibial surface wound is closed. The plantar foot wound has some surrounding callus buildup. The area of darkening that I debrided last week and revealed an opening in the skin surface has closed again. The tunnel in the large wound on his lateral leg has come in by about 3 cm. There is healthy granulation tissue on the entire wound surface. [name], [name] ([id number]) [id number] Page 13 of 20 11/23/2021: The patient was out of town last week and did wet-to-dry dressings on his large wound. 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 Unfortunately, he picked open the wound on his left medial thigh. He says that it was itching and he just could not stop scratching it until it was open again. The wound on his plantar foot is smaller and has not accumulated a tremendous amount of callus. The lateral leg wound is shallower and the tunnel has also decreased in depth. There is just a little bit of slough accumulation on the surface. 11/30/2021: Another portion of his left medial thigh has been opened up. All of these wounds are fairly superficial with just a little bit of slough and eschar accumulation. The wound on his plantar foot is almost closed with just a bit of eschar and periwound callus accumulation. The lateral leg wound is nearly flush with the surrounding skin and the tunnel is markedly shallower. 12/07/2021: There is just 1 open area on his left medial thigh. It is clean with just a little bit of perimeter eschar. The wound on his plantar foot continues to contract and just has some eschar and periwound callus accumulation. The lateral leg wound is closing at the more distal aspect and the tunnel is smaller. The surface  is nearly flush with the surrounding skin and it has a good bed of granulation tissue. 12/14/2021: The thigh and foot wounds are closed. The lateral leg wound has closed over approximately half of its length. The tunnel continues to contract and the surface is now flush with the surrounding skin. The wound bed has robust granulation tissue. 12/22/2021: The thigh and foot wounds have reopened. The foot wound has a lot of callus accumulation around and over it. The thigh wound is tiny with just a little bit of slough in the wound bed. The lateral leg wound continues to contract. His vascular surgeon took the wound VAC off earlier in the week and the patient has been doing wet-to-dry dressings. There is a little slough accumulation on the surface. The tunnel is about 3 cm in depth at this point. 12/28/2021: The thigh wound is closed again. The foot wound has some callus that subsequently has peeled back exposing just a small slit of a wound. The lateral leg wound Is down to about half the size that it originally was and the tunnel is down to about half a centimeter in depth. 01/04/2022: The thigh wound remains closed. The foot wound has heavy callus overlying the  wound site. Once this was debrided, the wound was found to be closed. The lateral leg wound is smaller again this week and very superficial. No tunnel could be identified. 01/12/2022: The thigh and foot wounds both remain closed. The lateral leg wound is now nearly flush with the skin surface. There is good granulation tissue present with a light layer of slough. 01/19/2022: Due to the way his wrap was placed, the patient did not change the dressing on his thigh at all and so the foam was saturated and his skin is macerated. There is a light layer of slough on the wound surface. The underlying granulation tissue is robust and healthy-appearing. He has heavy callus buildup at the site of his first metatarsal head wound which is still healed. 02/01/2022:  He has been in silver alginate. When he removed the dressing from his thigh wound, however, some leg, superficially reopening a portion of the wound that had healed. In addition, underneath the callus at his left first metatarsal head, there appears to be a blister and the wound appears to be open again. 02/08/2022: The lateral leg wound has contracted substantially. There is eschar and a light layer of slough present. He says that it is starting to pull and is uncomfortable. On inspection, there is some puckering of the scar and the eschar is quite dry; this may account for his symptoms. On his first metatarsal head, the wound is much smaller with just some eschar on the surface. The callus has not reaccumulated. He reports that he had a blister come up on his medial thigh wound at the distal aspect. It popped and there is now an opening in his skin again. Looking back through his [place] of wound photos, there is what looks like a permanent suture just deep to this location and it may be trying to erode through. We have been using silver alginate on his wounds. 02/15/2022: The lateral leg wound is about half the size it was last week. It is clean with just a little perimeter eschar and light slough. The wound on his first metatarsal head is about the same with heavy callus overlying it. The medial thigh wound is closed again. He does have some skin changes on the top of his foot that looks potentially yeast related. 02/22/2022: The skin on the top of his foot improved with the use of a topical antifungal. The lateral leg wound continues to contract and is again smaller this week. There is a little bit of slough and eschar on the surface. The first metatarsal head wound is a little bit smaller but has reaccumulated a thick callus over the top. He decided to try to trim his toenail and ultimately took the entire nail off of his left great toe. 03/02/2022: His lateral leg wound continues to improve, as does  the wound on his left great toe. Unfortunately, it appears that somehow his foot got wet and moisture seeped in through the opening causing his skin to lift. There is a large wound now overlying his first metatarsal on both the plantar, medial, and dorsal portion of his foot. There is necrotic tissue and slough present underneath the shaggy macerated skin. 03/08/2022: The lateral leg wound is smaller again today. There is just a light layer of slough and eschar on the surface. The great toe wound is smaller again today. The first metatarsal wound is a little bit smaller today and does not look nearly as necrotic and macerated. There is still slough and  nonviable tissue present. 03/15/2022: The lateral leg wound is narrower and just has a little bit of light slough buildup. The first metatarsal wound still has a fair amount of moisture affecting the periwound skin. The great toe wound is healed. 03/22/2022: The lateral leg wound is now isolated to just at the level of his knee. There is some eschar and slough accumulation. The first metatarsal head wound has epithelialized tremendously and is about half the size that it was last week. He still has some maceration on the top of his foot and a fungal odor is present. 03/29/2022: T oday the patient's foot was macerated, suggesting that the cast got wet. The patient has also been picking at his dry skin and has enlarged the wound on his left lateral leg. In the time between having his cast removed and my evaluation, he had picked more dry skin and opened up additional wounds on his Achilles area and dorsal foot. The plantar first metatarsal head wound, however, is smaller and clean with just macerated callus around the perimeter and light slough on the surface. The lateral leg wound measured a little bit larger but is also fairly clean with eschar and minimal slough. 04/02/2022: The patient had vascular studies done last Friday and so his cast was not applied.  He is here today to have that done. 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 04/05/2022: Both wounds are smaller and quite clean. There is just a little biofilm on the lateral leg wound. 10/20; the patient has a wound on the left lateral surgical incision at the level of his lateral knee this looks clean and improved. He is using silver alginate. He also has an area on his left medial foot for which she is using Hydrofera Blue under a total contact cast both wounds are measuring smaller 04/20/2022: The plantar foot wound has contracted considerably and is very close to closing. The lateral leg wound was measured a little larger, but there was a tiny open area that was included in the measurements that was not included last week. He has some eschar around the perimeter but otherwise the wound looks clean. 04/27/2022: The lateral leg wound looks better this week. He says that midweek, he felt it was very dry and began applying hydrogel to the site. I think this was beneficial. The foot wound is nearly closed underneath a thick layer of dry skin and callus. 05/04/2022: The foot wound is healed. He has developed a new small ulcer on his anterior tibial surface about midway up his leg. It has a little slough on the surface. The lateral leg wound still is fairly dry, but clean with just a little biofilm on the surface. 05/11/2022: The wound on his foot reopened on Wednesday. A large blister formed which then broke open revealing the fat layer underneath. The ulcer on his anterior tibial surface is a little bit larger this week. The lateral leg wound has much better moisture balance this week. Fortunately, prior to his foot wound reopening, he did get the cast made for his orthotic. TAEDYN, GLASSCOCK ([id number]) [id number] Page 14 of 20 05/15/2022: Already, the left medial foot wound has improved. The tissue is less macerated and the surface is clean. The ulcer on his  anterior tibial surface continues to enlarge. This seems likely secondary to accumulated moisture. The lateral leg wound continues to have an improved moisture balance with the use of collagen. 05/25/2022: The medial foot wound continues to contract. It is  now substantially smaller with just a little slough on the surface. The anterior tibial surface wound continues to enlarge further. Once again, this seems to be secondary to moisture. The lateral leg wound does not seem to be changing much in size, but the moisture balance is better. 06/01/2022: The anterior tibial wound is closed. The medial foot wound is down to just a very small, couple of millimeters, opening. The lateral leg wound has good moisture balance, but remains unchanged in size. 12/15; the patient's anterior tibial wound has reopened, however the area on his right first metatarsal head is closed. The major wound is actually on the superior part of his surgical wound in the left lateral thigh. Not a completely viable surface under illumination. This may at some point require a debridement I think he is currently using Prisma. As noted the left medial foot wound has closed 06/14/2022: The anterior tibial wound has closed. The lateral leg wound has a better surface but is basically unchanged in size. The left medial foot wound has reopened. It looks as though there was some callus accumulation and moisture got under the callus which caused the tissue to break down again. 06/21/2022: A new wound has opened up just distal to the previous anterior tibial wound. It is small but has hypertrophic granulation tissue present. The lateral leg wound is a little bit narrower and has a layer of slough on the surface. The left medial foot wound is down to just a pinhole. His custom orthotics should be available next week. 06/28/2022: The wound on his first metatarsal head has healed. He has developed a new small wound on his medial lower leg, in an old  scar site. The lateral leg wound continues to contract but continues to accumulate slough, as well. 07/03/2022: Despite wearing his custom orthopedic shoes, he managed to reopen the wound on his first metatarsal head. He says he thinks his foot got wet and then some skin lifted up and he peeled this away. Both of the lower leg wounds are smaller and have some dry eschar on the surface. The lateral leg wound is quite a bit narrower today. 07/12/2022: The medial lower leg wound is closed. The anterior lower leg wound has contracted considerably. The lateral upper leg wound is narrower with a layer of slough on the surface. The first metatarsal head wound is also smaller, but had copious drainage which saturated the foam border dressing and resulted in some periwound tissue maceration. Fortunately there was no breakdown at this site. 07/19/2022: The lower leg shows signs of significant maceration; I think he must be sweating excessively inside his cast. There are several areas of skin breakdown present. The wound on his foot is smaller and that on his lateral leg is narrower and is shorter by about a centimeter. 07/26/2022: Last week we used a zinc Coflex wrap prior to applying his total contact cast and this has had the effect of keeping his skin from getting macerated this week. The anterior leg wound has epithelialized substantially. The lateral leg wound is significantly smaller with just a bit of slough on the surface. The first metatarsal head wound is also smaller this week. 08/02/2022: The anterior leg wound was closed on arrival, but while he was sitting in the room, he picked it open again. The lateral leg wound is smaller with just a little slough on the surface and the first metatarsal head wound has contracted further, as well. Patient History Information obtained from Patient. Family History  Diabetes - Mother, Heart Disease - Paternal Grandparents,Mother,Father,Siblings, Stroke - Father, No  family history of Cancer, Hereditary Spherocytosis, Hypertension, Kidney Disease, Lung Disease, Seizures, Thyroid Problems, Tuberculosis. Social History Former smoker - quit 1999, Marital Status - Married, Alcohol Use - Moderate, Drug Use - No History, Caffeine Use - Rarely. Medical History Eyes Patient has history of Glaucoma - both eyes Denies history of Cataracts, Optic Neuritis Ear/Nose/Mouth/Throat Denies history of Chronic sinus problems/congestion, Middle ear problems Hematologic/Lymphatic Denies history of Anemia, Hemophilia, Human Immunodeficiency Virus, Lymphedema, Sickle Cell Disease Respiratory Patient has history of Sleep Apnea - CPAP Denies history of Aspiration, Asthma, Chronic Obstructive Pulmonary Disease (COPD), Pneumothorax, Tuberculosis Cardiovascular Patient has history of Hypertension, Peripheral Arterial Disease, Peripheral Venous Disease Denies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

## 2022-08-03 NOTE — Progress Notes (Signed)
[name], [name] ([id number]) [id number] Page 1 of 10 Visit Report for 08/02/2022 Arrival Information Details Patient Name: Date of Service: [name], [name] 08/02/2022 10:45 A M Medical Record Number: [id number] Patient Account Number: [id number] Date of Birth/Sex: Treating RN: [birth year] (72 y.o. [name] Primary Care Wister Hoefle: [name] Other Clinician: Referring Halina Asano: Treating Hava Massingale/Extender: [name] in Treatment: 60 Visit Information History Since Last Visit Added or deleted any medications: No Patient Arrived: Ambulatory Any new allergies or adverse reactions: No Arrival Time: 10:52 Had a fall or experienced change in No Accompanied By: self activities of daily living that may affect Transfer Assistance: None risk of falls: Patient Identification Verified: Yes Signs or symptoms of abuse/neglect since last visito No Secondary Verification Process Completed: Yes Hospitalized since last visit: No Patient Requires Transmission-Based Precautions: No Implantable device outside of the clinic excluding No Patient Has Alerts: Yes cellular tissue based products placed in the center since last visit: Has Dressing in Place as Prescribed: Yes Pain Present Now: Yes Electronic Signature(s) Signed: 08/02/2022 3:36:20 PM By: Blanche East RN Entered By: Blanche East on 08/02/2022 10:53:38 -------------------------------------------------------------------------------- Encounter Discharge Information Details Patient Name: Date of Service: [name]. 08/02/2022 10:45 A M Medical Record Number: [id number] Patient Account Number: [id number] Date of Birth/Sex: Treating RN: [birth year] (72 y.o. [name] Primary Care Ellarie Picking: [name] Other Clinician: Referring Anjelika Ausburn: Treating Kail Fraley/Extender: [name] in Treatment: 65 Encounter Discharge Information Items Post Procedure  Vitals Discharge Condition: Stable Temperature (F): 98.0 Ambulatory Status: Ambulatory Pulse (bpm): 60 Discharge Destination: Home Respiratory Rate (breaths/min): 18 Transportation: Private Auto Blood Pressure (mmHg): 157/91 Accompanied By: self Schedule Follow-up Appointment: Yes Clinical Summary of Care: Electronic Signature(s) Signed: 08/02/2022 12:01:03 PM By: Blanche East RN Entered By: Blanche East on 08/02/2022 12:01:03 [name] ([id number]) [id number] Page 2 of 10 -------------------------------------------------------------------------------- Lower Extremity Assessment Details Patient Name: Date of Service: [name], [name] 08/02/2022 10:45 A M Medical Record Number: [id number] Patient Account Number: [id number] Date of Birth/Sex: Treating RN: 12/26/1950 (72 y.o. [name] Primary Care Ajai Terhaar: [name] Other Clinician: Referring Jarrad Mclees: Treating Analicia Skibinski/Extender: [name] in Treatment: 68 Edema Assessment Assessed: [Left: No] [Right: No] Edema: [Left: Ye] [Right: s] Calf Left: Right: Point of Measurement: 41 cm From Medial Instep 48 cm Ankle Left: Right: Point of Measurement: 10 cm From Medial Instep 31 cm Vascular Assessment Pulses: Dorsalis Pedis Palpable: [Left:Yes] Electronic Signature(s) Signed: 08/02/2022 3:36:20 PM By: Blanche East RN Entered By: Blanche East on 08/02/2022 11:02:59 -------------------------------------------------------------------------------- Multi Wound Chart Details Patient Name: Date of Service: [name]. 08/02/2022 10:45 A M Medical Record Number: [id number] Patient Account Number: [id number] Date of Birth/Sex: Treating RN: [birth year] (72 y.o. 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 Primary Etiology: Extremity Glaucoma, Sleep Apnea, Glaucoma, Sleep Apnea, Glaucoma, Sleep Apnea, Comorbid History: Hypertension, Peripheral Arterial Hypertension, Peripheral Arterial Hypertension, Peripheral Arterial Disease, Peripheral Venous Disease, Disease, Peripheral Venous Disease, Disease, Peripheral Venous Disease, Type II Diabetes, Gout, Osteoarthritis, Type II Diabetes, Gout, Osteoarthritis, Type II Diabetes, Gout, Osteoarthritis,  Neuropathy Neuropathy Neuropathy 08/23/2020 06/03/2021 06/21/2022 Date Acquired: 49 60 6 Weeks of Treatment: Open Open Open Wound Status: Yes No No Wound Recurrence: No Yes No Clustered Wound: N/A 3 N/A Clustered Quantity: 0.3x1x0.1 3x0.3x0.1 0.1x0.1x0.1 Measurements L x W x D (cm) 0.236 0.707 0.008 A (cm) : rea 0.024 0.071 0.001 Volume (cm) : 98.30% 57.10% 98.00% % Reduction in Area: 99.20% 94.60% 97.40% % Reduction in Volume: Grade 2 Full Thickness With Exposed Support Full Thickness Without Exposed Classification: Structures Support Structures Medium Medium Medium Exudate A mount: Serosanguineous Serosanguineous Serous Exudate Type: red, brown red, brown amber Exudate Color: Flat and Intact Fibrotic scar, thickened scar Distinct, outline attached Wound Margin: Small (1-33%) Large (67-100%) Medium (34-66%) Granulation Amount: Red Red, Pink Red Granulation Quality: Large (67-100%) Small (1-33%) Medium (34-66%) Necrotic Amount: Adherent [place] Necrotic Tissue: Fat Layer  (Subcutaneous Tissue): Yes Fat Layer (Subcutaneous Tissue): Yes Fat Layer (Subcutaneous Tissue): Yes Exposed Structures: Fascia: No Fascia: No Fascia: No Tendon: No Tendon: No Tendon: No Muscle: No Muscle: No Muscle: No Joint: No Joint: No Joint: No Bone: No Bone: No Bone: No Small (1-33%) Medium (34-66%) Large (67-100%) Epithelialization: Callus: Yes Scarring: Yes No Abnormalities Noted Periwound Skin Texture: Dry/Scaly: Yes Dry/Scaly: Yes Maceration: Yes Periwound Skin Moisture: Maceration: No No Abnormalities Noted Hemosiderin Staining: Yes Hemosiderin Staining: Yes Periwound Skin Color: No Abnormality No Abnormality No Abnormality Temperature: N/A Yes N/A Tenderness on Palpation: Treatment Notes Electronic Signature(s) Signed: 08/02/2022 11:22:23 AM By: Fredirick Maudlin MD FACS Entered By: Fredirick Maudlin on 08/02/2022 11:22:22 -------------------------------------------------------------------------------- Multi-Disciplinary Care Plan Details Patient Name: Date of Service: [name]. 08/02/2022 10:45 A M Medical Record Number: [id number] Patient Account Number: [id number] Date of Birth/Sex: Treating RN: 11/13/1950 (72 y.o. [name] Primary Care Acelin Ferdig: [name] Other Clinician: Referring Tazaria Dlugosz: Treating Hera Celaya/Extender: [name] in Treatment: 83 Giles reviewed with physician Active Inactive Venous Leg Ulcer Nursing Diagnoses: Knowledge deficit related to disease process and management Potential for venous Insuffiency (use before diagnosis confirmed) TORRIANO, SESSIONS ([id number]) [id number] Page 4 of 10 Goals: Patient will maintain optimal edema control Date Initiated: 07/27/2021 Target Resolution Date: 02/23/2023 Goal Status: Active Interventions: Assess peripheral edema status every visit. Treatment Activities: Therapeutic compression applied :  07/27/2021 Notes: Wound/Skin Impairment Nursing Diagnoses: Impaired tissue integrity Knowledge deficit related to ulceration/compromised skin integrity Goals: Patient will have a decrease in wound volume by X% from date: (specify in notes) Date Initiated: 04/12/2021 Date Inactivated: 01/04/2022 Target Resolution Date: 04/23/2021 Goal Status: Met Patient/caregiver will verbalize understanding of skin care regimen Date Initiated: 01/04/2022 Target Resolution Date: 02/23/2023 Goal Status: Active Ulcer/skin breakdown will have a volume reduction of 30% by week 4 Date Initiated: 04/12/2021 Date Inactivated: 04/27/2021 Target Resolution Date: 04/27/2021 Goal Status: Unmet Unmet Reason: infection Ulcer/skin breakdown will have a volume reduction of 50% by week 8 Date Initiated: 04/27/2021 Date Inactivated: 06/29/2021 Target Resolution Date: 06/24/2021 Goal Status: Met Interventions: Assess patient/caregiver ability to obtain necessary supplies Assess patient/caregiver ability to perform ulcer/skin care regimen upon admission and as needed Assess ulceration(s) every visit Notes: Electronic Signature(s) Signed: 08/02/2022 11:55:44 AM By: Blanche East RN Entered By: Blanche East on 08/02/2022 11:55:42 -------------------------------------------------------------------------------- Pain Assessment Details Patient Name: Date of Service: [name]. 08/02/2022 10:45 A M Medical Record Number: [id number] Patient Account Number: [id number] Date of Birth/Sex: Treating RN: 05/20/1951 (72 y.o. [name] Primary Care Franciszek Platten: [name] Other Clinician: Referring Janalyn Higby: Treating Avice Funchess/Extender: Celine Ahr  Lindaann Pascal, Carloyn Manner Weeks in Treatment: 68 Active Problems Location of Pain Severity and Description of Pain Patient Has Paino Yes Site Locations Rate the pain. VENSON, STUMBO ([id number]) [id number] Page 5 of 10 Rate the pain. Current Pain Level:  6 Character of Pain Describe the Pain: Sharp, Throbbing Pain Management and Medication Current Pain Management: Electronic Signature(s) Signed: 08/02/2022 3:36:20 PM By: Blanche East RN Entered By: Blanche East on 08/02/2022 10:54:08 -------------------------------------------------------------------------------- Patient/Caregiver Education Details Patient Name: Date of Service: [place], [name] 2/8/2024andnbsp10:45 A M Medical Record Number: [id number] Patient Account Number: [id number] Date of Birth/Gender: Treating RN: 01-16-1951 (72 y.o. [name] Primary Care Physician: [name] Other Clinician: Referring Physician: Treating Physician/Extender: [name] in Treatment: 39 Education Assessment Education Provided To: Patient Education Topics Provided Wound Debridement: Methods: Explain/Verbal Responses: Reinforcements needed, State content correctly Wound/Skin Impairment: Methods: Explain/Verbal Responses: Reinforcements needed, State content correctly Electronic Signature(s) Signed: 08/02/2022 3:36:20 PM By: Blanche East RN Entered By: Blanche East on 08/02/2022 11:56:02 [name] ([id number]) [id number] Page 6 of 10 -------------------------------------------------------------------------------- Wound Assessment Details Patient Name: Date of Service: [name], [name] 08/02/2022 10:45 A M Medical Record Number: [id number] Patient Account Number: [id number] Date of Birth/Sex: Treating RN: 1950/07/01 (72 y.o. [name] Primary Care Ellisyn Icenhower: [name] Other Clinician: Referring Patty Lopezgarcia: Treating Ura Hausen/Extender: [name] in Treatment: 68 Wound Status Wound Number: 18R Primary Diabetic Wound/Ulcer of the Lower Extremity Etiology: Wound Location: Left, Plantar Metatarsal head first Wound Open Wounding Event: Gradually Appeared Status: Date Acquired:  08/23/2020 Comorbid Glaucoma, Sleep Apnea, Hypertension, Peripheral Arterial Disease, Weeks Of Treatment: 68 History: Peripheral Venous Disease, Type II Diabetes, Gout, Osteoarthritis, Clustered Wound: No Neuropathy Photos Wound Measurements Length: (cm) 0.3 Width: (cm) 1 Depth: (cm) 0.1 Area: (cm) 0.236 Volume: (cm) 0.024 % Reduction in Area: 98.3% % Reduction in Volume: 99.2% Epithelialization: Small (1-33%) Tunneling: No Undermining: No Wound Description Classification: Grade 2 Wound Margin: Flat and Intact Exudate Amount: Medium Exudate Type: Serosanguineous Exudate Color: red, brown Foul Odor After Cleansing: No Slough/Fibrino Yes Wound Bed Granulation Amount: Small (1-33%) Exposed Structure Granulation Quality: Red Fascia Exposed: No Necrotic Amount: Large (67-100%) Fat Layer (Subcutaneous Tissue) Exposed: Yes Necrotic Quality: Adherent Slough Tendon Exposed: No Muscle Exposed: No Joint Exposed: No Bone Exposed: No Periwound Skin Texture Texture Color No Abnormalities Noted: No No Abnormalities Noted: Yes Callus: Yes Temperature / Pain Temperature: No Abnormality Moisture No Abnormalities Noted: No Dry / Scaly: Yes Maceration: No Treatment Notes Wound #18R (Metatarsal head first) Wound Laterality: Plantar, Left Cleanser Soap and Water Discharge Instruction: May shower and wash wound with dial antibacterial soap and water prior to dressing change. Wound Cleanser Discharge Instruction: Cleanse the wound with wound cleanser prior to applying a clean dressing using gauze sponges, not tissue or cotton balls. FONZIE, ASARE ([id number]) [id number] Page 7 of 10 Peri-Wound Care Topical Primary Dressing Sorbalgon AG Dressing 2x2 (in/in) Discharge Instruction: Apply to wound bed as instructed Secondary Dressing Zetuvit Plus Silicone Border Dressing 4x4 (in/in) Discharge Instruction: Apply silicone border over primary dressing as  directed. Secured With Compression Wrap Compression Stockings Environmental education officer) Signed: 08/02/2022 3:36:20 PM By: Blanche East RN Signed: 08/02/2022 4:29:11 PM By: Dellie Catholic RN Entered By: Dellie Catholic on 08/02/2022 11:11:25 -------------------------------------------------------------------------------- Wound Assessment Details Patient Name: Date of Service: [name]. 08/02/2022 10:45 A M Medical Record Number: [id number] Patient Account Number: [id number] Date of Birth/Sex: Treating RN: Apr 05, 1951 (72 y.o. M) Willey Blade, Roselyn Reef  Primary Care Gaynell Eggleton: [name] Other Clinician: Referring Yuli Lanigan: Treating Syana Degraffenreid/Extender: [name] in Treatment: 68 Wound Status Wound Number: 22 Primary Cyst Etiology: Wound Location: Left, Proximal, Lateral Lower Leg Wound Open Wounding Event: Bump Status: Date Acquired: 06/03/2021 Comorbid Glaucoma, Sleep Apnea, Hypertension, Peripheral Arterial Disease, Weeks Of Treatment: 60 History: Peripheral Venous Disease, Type II Diabetes, Gout, Osteoarthritis, Clustered Wound: Yes Neuropathy Photos Wound Measurements Length: (cm) Width: (cm) Depth: (cm) Clustered Quantity: Area: (cm) Volume: (cm) 3 % Reduction in Area: 57.1% 0.3 % Reduction in Volume: 94.6% 0.1 Epithelialization: Medium (34-66%) 3 Tunneling: No 0.707 Undermining: No 0.071 Wound Description Classification: Full Thickness With Exposed Support Structures ORTON, WINTER ([id number]) Wound Margin: Fibrotic scar, thickened scar Exudate Amount: Medium Exudate Type: Serosanguineous Exudate Color: red, brown Foul Odor After Cleansing: No [id number] Page 8 of 10 Slough/Fibrino Yes Wound Bed Granulation Amount: Large (67-100%) Exposed Structure Granulation Quality: Red, Pink Fascia Exposed: No Necrotic Amount: Small (1-33%) Fat Layer (Subcutaneous Tissue) Exposed: Yes Necrotic Quality: Eschar,  Adherent Slough Tendon Exposed: No Muscle Exposed: No Joint Exposed: No Bone Exposed: No Periwound Skin Texture Texture Color No Abnormalities Noted: No No Abnormalities Noted: No Scarring: Yes Hemosiderin Staining: Yes Moisture Temperature / Pain No Abnormalities Noted: No Temperature: No Abnormality Dry / Scaly: Yes Tenderness on Palpation: Yes Treatment Notes Wound #22 (Lower Leg) Wound Laterality: Left, Lateral, Proximal Cleanser Soap and Water Discharge Instruction: May shower and wash wound with dial antibacterial soap and water prior to dressing change. Wound Cleanser Discharge Instruction: Cleanse the wound with wound cleanser prior to applying a clean dressing using gauze sponges, not tissue or cotton balls. Peri-Wound Care Sween Lotion (Moisturizing lotion) Discharge Instruction: Apply moisturizing lotion to the leg Topical Skintegrity Hydrogel 4 (oz) Discharge Instruction: Apply hydrogel as directed Primary Dressing Endoform 2x2 in Discharge Instruction: Moisten with Hydrogel or saline Secondary Dressing ABD Pad, 8x10 Discharge Instruction: Apply over primary dressing as directed. Secured With Elastic Bandage 4 inch (ACE bandage) Discharge Instruction: Secure with ACE bandage as directed. Compression Wrap Compression Stockings Add-Ons Electronic Signature(s) Signed: 08/02/2022 3:36:20 PM By: Blanche East RN Signed: 08/02/2022 4:29:11 PM By: Dellie Catholic RN Entered By: Dellie Catholic on 08/02/2022 11:12:12 -------------------------------------------------------------------------------- Wound Assessment Details Patient Name: Date of Service: [name]. 08/02/2022 10:45 A Kathrin Penner ([id number] Page 9 of 10 Medical Record Number: [id number] Patient Account Number: [id number] Date of Birth/Sex: Treating RN: 11-18-1950 (72 y.o. [name] Primary Care Rose-Marie Hickling: [name] Other Clinician: Referring  Djuan Talton: Treating Jaqlyn Gruenhagen/Extender: [name] in Treatment: 68 Wound Status Wound Number: 30 Primary Abrasion Etiology: Wound Location: Left, Anterior Lower Leg Wound Open Wounding Event: Shear/Friction Status: Date Acquired: 06/21/2022 Comorbid Glaucoma, Sleep Apnea, Hypertension, Peripheral Arterial Disease, Weeks Of Treatment: 6 History: Peripheral Venous Disease, Type II Diabetes, Gout, Osteoarthritis, Clustered Wound: No Neuropathy Photos Wound Measurements Length: (cm) 0.1 Width: (cm) 0.1 Depth: (cm) 0.1 Area: (cm) 0.008 Volume: (cm) 0.001 % Reduction in Area: 98% % Reduction in Volume: 97.4% Epithelialization: Large (67-100%) Tunneling: No Undermining: No Wound Description Classification: Full Thickness Without Exposed Support Structures Wound Margin: Distinct, outline attached Exudate Amount: Medium Exudate Type: Serous Exudate Color: amber Foul Odor After Cleansing: No Slough/Fibrino Yes Wound Bed Granulation Amount: Medium (34-66%) Exposed Structure Granulation Quality: Red Fascia Exposed: No Necrotic Amount: Medium (34-66%) Fat Layer (Subcutaneous Tissue) Exposed: Yes Necrotic Quality: Adherent Slough Tendon Exposed: No Muscle Exposed: No Joint Exposed: No Bone Exposed: No Periwound Skin Texture  Texture Color No Abnormalities Noted: Yes No Abnormalities Noted: No Hemosiderin Staining: Yes Moisture No Abnormalities Noted: No Temperature / Pain Maceration: Yes Temperature: No Abnormality Treatment Notes Wound #30 (Lower Leg) Wound Laterality: Left, Anterior Cleanser Soap and Water Discharge Instruction: May shower and wash wound with dial antibacterial soap and water prior to dressing change. Wound Cleanser Discharge Instruction: Cleanse the wound with wound cleanser prior to applying a clean dressing using gauze sponges, not tissue or cotton balls. Peri-Wound Care Topical Primary Dressing SEARLE, CASTORINA  ([id number]) [phone].pdf Page 10 of 10 Sorbalgon AG Dressing 2x2 (in/in) Discharge Instruction: Apply to wound bed as instructed Secondary Dressing Zetuvit Plus Silicone Border Dressing 4x4 (in/in) Discharge Instruction: Apply silicone border over primary dressing as directed. Secured With Compression Wrap CoFlex TLC Zinc Compression Stockings Add-Ons Electronic Signature(s) Signed: 08/02/2022 3:36:20 PM By: Blanche East RN Signed: 08/02/2022 4:29:11 PM By: Dellie Catholic RN Entered By: Dellie Catholic on 08/02/2022 11:12:56 -------------------------------------------------------------------------------- Vitals Details Patient Name: Date of Service: [name]. 08/02/2022 10:45 A M Medical Record Number: [id number] Patient Account Number: [id number] Date of Birth/Sex: Treating RN: [birth year] (72 y.o. [name] Primary Care Domique Reardon: [name] Other Clinician: Referring Visente Kirker: Treating Nicki Gracy/Extender: [name] in Treatment: 60 Vital Signs Time Taken: 10:53 Temperature (F): 98.0 Height (in): 74 Pulse (bpm): 60 Weight (lbs): 238 Respiratory Rate (breaths/min): 18 Body Mass Index (BMI): 30.6 Blood Pressure (mmHg): 157/91 Reference Range: 80 - 120 mg / dl Electronic Signature(s) Signed: 08/02/2022 3:36:20 PM By: Blanche East RN Entered By: Blanche East on 08/02/2022 10:53:57

## 2022-08-08 ENCOUNTER — Encounter: Payer: Self-pay | Admitting: Nurse Practitioner

## 2022-08-09 ENCOUNTER — Encounter (HOSPITAL_BASED_OUTPATIENT_CLINIC_OR_DEPARTMENT_OTHER): Payer: Medicare Other | Admitting: General Surgery

## 2022-08-09 DIAGNOSIS — E11621 Type 2 diabetes mellitus with foot ulcer: Secondary | ICD-10-CM | POA: Diagnosis not present

## 2022-08-10 NOTE — Progress Notes (Signed)
DAULTON, BRITTS ([id number]) [id number] Page 1 of 19 Visit Report for 08/09/2022 Chief Complaint Document Details Patient Name: Date of Service: [name], [name] 08/09/2022 10:00 A M Medical Record Number: [id number] Patient Account Number: [id number] Date of Birth/Sex: Treating RN: [birth year] (72 y.o. M) Primary Care Provider: [name] Other Clinician: Referring Provider: Treating Provider/Extender: [name] in Treatment: 69 Information Obtained from: Patient Chief Complaint Left leg and foot ulcers 04/12/2021; patient is here for wounds on his left lower leg and left plantar foot over the first metatarsal head Electronic Signature(s) Signed: 08/09/2022 10:57:41 AM By: Fredirick Maudlin MD FACS Entered By: Fredirick Maudlin on 08/09/2022 10:57:40 -------------------------------------------------------------------------------- HPI Details Patient Name: Date of Service: [name]. 08/09/2022 10:00 A M Medical Record Number: [id number] Patient Account Number: [id number] Date of Birth/Sex: Treating RN: [birth year] (72 y.o. M) Primary Care Provider: [name] Other Clinician: Referring Provider: Treating Provider/Extender: [name] in Treatment: 29 History of Present Illness HPI Description: 10/11/17; Mr. Vogan is a 72 year old man who tells me that in 2015 he slipped down the latter traumatizing his left leg. He developed a wound in the same spot the area that we are currently looking at. He states this closed over for the most part although he always felt it was somewhat unstable. In 2016 he hit the same area with the door of his car had this reopened. He tells me that this is never really closed although sometimes an inflow it remains open on a constant basis. He has not been using any specific dressing to this except for topical antibiotics the nature of which were not really sure. His primary  doctor did send him to see Dr. [name] of interventional cardiology. He underwent an angiogram on 08/06/17 and he underwent a PTA and directional atherectomy of the lesser distal SFA and popliteal arteries which resulted in brisk improvement in blood flow. It was noted that he had 2 vessel runoff through the anterior tibial and peroneal. He is also been to see vascular and interventional radiologist. He was not felt to have any significant superficial venous insufficiency. Presumably is not a candidate for any ablation. It was suggested he come here for wound care. The patient is a type II diabetic on insulin. He also has a history of venous insufficiency. ABIs on the left were noncompressible in our clinic 10/21/17; patient we admitted to the clinic last week. 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 We put Iodosorb on him after an aggressive debridement and 3 layer compression. He complained of pain in his ankle and itching with is skin in fact he scratched the area on the medial calf superiorly at the rim of our wraps and he has 2 small open areas in that location today which are new. I changed his primary dressing today to silver collagen. As noted he is already had revascularization and does not have any significant superficial venous insufficiency that would be amenable to ablation 10/28/17; patient admitted to the clinic 2 weeks ago. He has a smaller Wound. Scratch injury from last week revealed. There is large wound over the tibial area. This is smaller. Granulation looks healthy. No need for debridement. 11/04/17; the wound on the left lateral calf looks better. Improved dimensions. Surface of this looks better. We've been maintaining him and Kerlix Coban wraps. He finds this much more comfortable. Silver collagen dressing 11/11/17; left lateral Wound  continues to look healthy be making progress. Using a #5 curet I removed removed  nonviable skin from the surface of the wound and then necrotic debris from the wound surface. Surface of the wound continues to look healthy. He also has an open area on the left great toenail bed. We've been using topical antibiotics. 11/19/17; left anterior lateral wound continues to look healthy but it's not closed. He also had a small wound above this on the left leg Initially traumatic wounds in the setting of significant chronic venous insufficiency and stasis dermatitis 11/25/17; left anterior wounds superiorly is closed still a small wound inferiorly. POLLARD, NOU ([id number]) [id number] Page 2 of 19 12/02/17; left anterior tibial area. Arrives today with adherent callus. Post debridement clearly not completely closed. Hydrofera Blue under 3 layer compression. 12/09/17; left anterior tibia. Circumferential eschar however the wound bed looks stable to improved. We've been using Hydrofera Blue under 3 layer compression 12/17/17; left anterior tibia. Apparently this was felt to be closed however when the wrap was taken off there is a skin tear to reopen wounds in the same area we've been using Hydrofera Blue under 3 layer compression 12/23/17 left anterior tibia. Not close to close this week apparently the [facility] was stuck to this again. Still circumferential eschar requiring debridement. I put a contact layer on this this time under the Hydrofera Blue 12/31/17; left anterior tibia. Wound is better slight amount of hyper-granulation. Using Hydrofera Blue over Adaptic. 01/07/18; left anterior tibia. The wound had some surface eschar however after this was removed he has no open wound.he was already revascularized by Dr. [name] when he came to our clinic with atherectomy of the left SFA and popliteal artery. He was also sent to interventional radiology for venous reflux studies. He was not felt to have significant reflux but certainly has chronic venous changes of his  skin with hemosiderin deposition around this area. He will definitely need to lubricate his skin and wear compression stocking and I've talked to him about this. READMISSION 05/26/2018 This is a now 72 year old man we cared for with traumatic wounds on his left anterior lower extremity. He had been previously revascularized during that admission by Dr. [name]. Apparently in follow-up Dr. [name] noted that he had deterioration in his arterial status. He underwent a stent placement in the distal left SFA on 04/22/2018. 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 He went back to the angiography suite on 04/30/2018 he underwent PTA and balloon angioplasty of the occluded left mid anterior tibial artery, thrombotic occlusion went from 100 to 0% which reconstitutes the posterior tibial artery. He had thrombectomy and aspiration of the peroneal artery. The stent placed in the distal SFA left SFA was still occluded. 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 He has been recuperating at [place] home in [facility] after the most recent hospitalization. He is going home tomorrow. He tells me that 2 weeks ago he traumatized the tip of his left fifth toe. He came in urgently for our review of this. This was a history of before I noted that Dr. [name] had already noted dry gangrenous changes of the left fifth toe 06/09/2018; 2-week follow-up. I did contact Dr. [name] after his last  appointment and he apparently saw 1 of Dr. Irven Shelling colleagues the next day. He does not follow-up with Dr. [name] himself until Thursday of this week. 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 Nevertheless there is no evidence of infection no drainage and  no pain. He had a new area that this week when we were signing him in today on the left anterior mid tibia area, this is in close proximity to the previous wound we have dealt with in this clinic. 06/23/2018; 2-week follow-up. I did not receive a recent note from Dr. [name] to review today. Our office is trying to obtain this. He is apparently not planning to do further vascular interventions and wondered about compression to try and help with the patient's chronic venous insufficiency. However we are also concerned about the arterial flow. He arrives in clinic today with a new area on the left third toe. The areas on the calf/anterior tibia are close to closing. The left fifth toe is still mummified using Betadine. -In reviewing things with the patient he has what sounds like claudication with mild to moderate amount of activity. 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 I prescribed Levaquin over the phone while we attempted to arrange a plan of care. However the patient called yesterday to report he had low-grade fever and he came in today acutely. There is been a marked deterioration in the left third toe with spreading cellulitis up into the dorsal left foot. He was referred to the emergency room. Readmission: 06/29/2020 patient presents today for reevaluation here in our clinic he was previously treated by Dr. Dellia Nims at the latter part of 2019 in 2 the beginning of 2020. Subsequently we have not seen him since that time in the interim he did have evaluation with vein and vascular specialist specifically Dr. Anice Paganini who did perform quite extensive work for a left femoral to anterior tibial artery bypass. With that being said in the interim the patient has developed significant lymphedema and has wounds that he tells me have really never healed in regard to the incision site on the left leg. He also has multiple wounds on the feet for various reasons some of  which is that he tends to pick at his feet. Fortunately there is no signs of active infection systemically at this time he does have some wounds that are little bit deeper but most are fairly superficial he seems to have good blood flow and overall everything appears to be healthy I see no bone exposed and no obvious signs of osteomyelitis. I do not know that he necessarily needs a x-ray at this point although that something we could consider depending on how things progress. The patient does have a history of lymphedema, diabetes, this is type II, chronic kidney disease stage III, hypertension, and history of peripheral vascular disease. 07/05/2020; patient admitted last week. Is a patient I remember from 2019 he had a spreading infection involving the left foot and we sent him to the hospital. 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 He subsequently had a left femoral to anterior tibial bypass by Dr.Cain vein and vascular. He also has severe lymphedema with chronic skin changes related to that on the left leg. The most problematic area that was new today was on the left medial great toe. This was apparently a small area last week there was purulent  drainage which our intake nurse cultured. Also areas on the left medial foot and heel left lateral foot. He has 2 areas on the left medial calf left lateral calf in the setting of the severe lymphedema. 07/13/2020 on evaluation today patient appears to be doing better in my opinion compared to his last visit. The good news is there is no signs of active infection systemically and locally I do not see any signs of infection either. He did have an x-ray which was negative that is great news he had a culture which showed MRSA but at the same time he is been on the doxycycline which has helped. I do think we may want to extend this for 7 additional days 1/25; patient admitted to the clinic a few weeks ago. 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 We have been putting him under compression his edema control is a lot better but he is severe verricused skin on the left leg. He is really done quite well he still has an open area on the left medial calf and the left medial first metatarsal head. We have been using silver collagen on the leg silver alginate on the foot 07/27/2020 upon evaluation today patient appears to be doing decently well in regard to his wounds. He still has a lot of dry skin on the left leg. Some of this is starting to peel back and I think he may be able to have them out by removing some that today. Fortunately there is no signs of active infection at this time on the left leg although on the right leg he does appear to have swelling and erythema as well as some mild warmth to touch. This does have been concerned about the possibility of cellulitis although within the differential diagnosis I do think that potentially a DVT has to be at least considered. We need to rule that out before proceeding would just call in the cellulitis. Especially since he is having pain in the posterior aspect of his calf muscle. 2/8; the patient had seen sparingly. He has severe skin changes of chronic lymphedema in the left leg thickened hyperkeratotic verrucous skin. He has an open wound on the medial part of the left first met head left mid tibia. He also has a rim of nonepithelialized skin in the anterior mid tibia. He brought in the AmLactin lotion that was been prescribed although I am not sure under compression and its utility. There concern about cellulitis on the right lower leg the last time he was here. He was put on on antibiotics. His DVT rule out was negative. The right leg looks fine he is using his stocking on this area 08/10/2020 upon evaluation today patient appears to be doing well with regard to his leg currently. He has been tolerating the dressing changes  without complication. Fortunately there is no signs of active infection which is great news. Overall very pleased with where things stand. 2/22; the patient still has an area on the medial part of the left first met his head. This looks better than when I last saw this earlier this month he has a rim of epithelialization but still some surface debris. Mostly everything on the left leg is healed. There is still a vulnerable in the left mid tibia area. 08/30/2020 upon evaluation today patient appears to be doing much better in regard to his wounds on his foot. Fortunately there does not appear to be any  signs of active infection systemically though locally we did culture this last week and it does appear that he does have MRSA currently. Nonetheless I think we will address that today I Minna send in a prescription for him in that regard. Overall though there does not appear to be any signs of significant worsening. 09/07/2020 on evaluation today patient's wounds over his left foot appear to be doing excellent. I do not see any signs of infection there is some callus buildup this can require debridement for certain but overall I feel like he is managing quite nicely. He still using the AmLactin cream which has been beneficial for him as well. ZHANE, CONTRERAZ ([id number]) [id number] Page 3 of 19 3/22; left foot wound is closed. There is no open area here. He is using ammonium lactate lotion to the lower extremities to help exfoliate dry cracked skin. He has compression stockings from elastic therapy in [place]. The wound on the medial part of his left first met head is healed today. READMISSION 04/12/2021 Mr. Marmol is a patient we know fairly well he had a prolonged stay in clinic in 2019 with wounds on his left lateral and left anterior lower extremity in the setting of chronic venous insufficiency. More recently he was here earlier this year with predominantly an area on his  left foot first metatarsal head plantar and he says the plantar foot broke down on its not long after we discharged him but he did not come back here. The last few months areas of broken down on his left anterior and again the left lateral lower extremity. The leg itself is very swollen chronically enlarged a lot of hyperkeratotic dry Berry Q skin in the left lower leg. His edema extends well into the thigh. He was seen by Dr. Donzetta Matters. He had ABIs on 03/02/2021 showing an ABI on the right of 1 with a TBI of 0.72 his ABI in the left at 1.09 TBI of 0.99. Monophasic and biphasic waveforms on the right. On the left monophasic waveforms were noted he went on to have an angiogram on 03/27/2021 this showed the aortic aortic and iliac segments were free of flow-limiting stenosis the left common femoral vein to evaluate the left femoral to anterior tibial artery bypass was unobstructed the bypass was patent without any areas of stenosis. We discharged the patient in bilateral juxta lite stockings but very clearly that was not sufficient to control the swelling and maintain skin integrity. He is clearly going to need compression pumps. 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 This is fortunate because he is on his feet for long periods of time. 10/27; patient comes in with our intake nurse reporting copious amount of green drainage from the left anterior mid tibia the left dorsal foot and to a lesser extent the left medial mid tibia. We left the compression wrap on all week for the amount of edema in his left leg is quite a bit better. We use silver alginate as the primary dressing 11/3; edema control is good. Left anterior lower leg left medial lower leg and the plantar first metatarsal head. The left anterior lower leg required debridement. Deep tissue culture I did of this wound showed MRSA I put him on 10 days of doxycycline which she will start today. We  have him in compression wraps. He has a security card and [employer] however he is retiring on November  15. We will need to then get him into a better offloading boot for the left foot perhaps a total contact cast 11/10; edema control is quite good. Left anterior and left medial lower leg wounds in the setting of chronic venous insufficiency and lymphedema. He also has a substantial area over the left plantar first metatarsal head. 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 He has significant hypergranulation on the left plantar foot wound. The patient is a diabetic but he does not have significant PAD 11/17; edema control is quite good. Left anterior and left medial lower leg wounds look better. The really concerning area remains the area on the left plantar first metatarsal head. He has a rim of epithelialization. 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 Starting today with a forefoot off loader but . possibly a total contact cast. 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 Arrives with a very significant odor from I think all of the wounds on the left leg and the left foot. Because of this I did not put a total contact cast on him today but will could still consider this. His wife was having cataract surgery which is the reason he missed the appointment 12/6. I saw this man 5 days ago with a swelling below the popliteal fossa. I thought he actually might have a Baker's cyst however the DVT rule out study that we could arrange right away was negative the technician told me this was not a ruptured Baker's cyst. We attempted to get this aspirated by under ultrasound guidance in interventional  radiology however all they did was an ultrasound however it shows an extensive fluid collection 62 x 8 x 9.4 in the left thigh and left calf. The patient states he thinks this started 8 days ago or so but he really is not complaining of any pain, fever or systemic symptoms. He has not ha 12/20; after some difficulty I managed to get the patient into see Dr. Donzetta Matters. Eventually he was taken into the hospital and had a drain put in the fluid collection below his left knee posteriorly extending into the posterior thigh. He still has the drain in place. Culture of this showed moderate staff aureus few Morganella and few Klebsiella he is now on doxycycline and ciprofloxacin as suggested by infectious disease he is on this for a month. The drain will remain in place until it stops draining 12/29; he comes in today with the 1 wound on his left leg and the area on the left plantar first met head significantly smaller. Both look healthy. He still has the drain in the left leg. He says he has to change this daily. Follows up with Dr. Donzetta Matters on January 11. 06/29/2021; the wounds that I am following on the left leg and left first met head continued to be quite healthy. However the area where his inferior drain is in place had copious amounts of drainage which was green in color. The wound here is larger. Follows up with Dr. Gwenlyn Saran of vein and vascular his surgeon next week as well as infectious disease. He remains on ciprofloxacin and doxycycline. He is  not complaining of excessive pain in either one of the drain areas 1/12; the patient saw vascular surgery and infectious disease. Vascular surgery has left the drain in place as there was still some notable drainage still see him back in 2 weeks. Dr. Velna Ochs stop the doxycycline and ciprofloxacin and I do not believe he follows up with them at this point. Culture I did last week showed both doxycycline resistant MRSA and Pseudomonas not sensitive to ciprofloxacin although  only in rare titers 1/19; the patient's wound on the left anterior lower leg is just about healed. We have continued healing of the area that was medially on the left leg. Left first plantar metatarsal head continues to get smaller. The major problem here is his 2 drain sites 1 on the left upper calf and lateral thigh. There is purulent drainage still from the left lateral thigh. I gave him antibiotics last week but we still have recultured. He has the drain in the area I think this is eventually going to have to come out. I suspect there will be a connecting wound to heal here perhaps with improved VAc 1/26; the patient had his drain removed by vein and vascular on 1/25/. This was a large pocket of fluid in his left thigh that seem to tunnel into his left upper calf. 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 His mention his Penrose drain was removed today. He now has a tunneling wound on his left calf and left thigh. Both of these probe widely towards each other although I cannot really prove that they connect. Both wounds on his lower leg anteriorly are closed and his area over the first metatarsal head on his right foot continues to improve. We are using Hydrofera Blue here. He also saw infectious disease culture of the abscess they noted was polymicrobial with MRSA, Morganella and Klebsiella he was treated with doxycycline and ciprofloxacin for 4 weeks ending on 07/03/2021. They did not recommend any further antibiotics. Notable that while he still had the Penrose drain in place last week he had purulent drainage coming out of the inferior IandD site this grew [facility], MRSA and Pseudomonas but there does not appear to be any active infection in this area today with the drain out and he is not systemically unwell 2/2; with regards to the drain sites the superior one on the thigh actually is closed down the one on the upper left lateral calf measures about 8 and  half centimeters which is an improvement seems to be less prominent although still with a lot of drainage. The only remaining wound is over the first metatarsal head on the left foot and this looks to be continuing to improve with Hydrofera Blue. 2/9; the area on his plantar left foot continues to contract. Callus around the wound edge. The drain sites specifically have not come down in depth. We put the wound VAC on Monday he changed the canister late last night our intake nurse reported a pocket of fluid perhaps caused by our compression wraps 2/16; continued improvement in left foot plantar wound. drainage site in the calf is not improved in terms of depth (wound vac) 2/23; continued improvement in the left foot wound over the first metatarsal head. With regards to the drain sites the area on his thigh laterally is healed MERYL, AGNER ([id number]) [phone].pdf Page 4 of 19 however the open area on his calf is small in terms of circumference by still probes in  by about 15 cm. Within using the wound VAC. Hydrofera Blue on his foot 08/24/2021: The left first metatarsal head wound continues to improve. The wound bed is healthy with just some surrounding callus. Unfortunately the open drain site on his calf remains open and tunnels at least 15 cm (the extent of a Q-tip). This is despite several weeks of wound VAC treatment. Based on reading back through the notes, there has been really no significant change in the depth of the wound, although the orifice is smaller and the more cranial wound on his thigh has closed. I suspect the tunnel tracks nearly all the way to this location. 08/31/2021: Continued improvement in the left first metatarsal head wound. There has been absolutely no improvement to the long tunnel from his open drain site on his calf. We have tried to get him into see vascular surgery sooner to consider the possibility of simply filleting the tract open and  allowing it to heal from the bottom up, likely with a wound VAC. They have not yet scheduled a sooner appointment than his current mid April 09/14/2021: He was seen by vascular surgery and they took him to the operating room last week. They opened a portion of the tunnel, but did not extend the entire length of the known open subcutaneous tract. I read Dr. Claretha Cooper operative note and it is not clear from that documentation why only a portion of the tract was opened. The heaped up granulation tissue was curetted and removed from at least some portion of the tract. They did place a wound VAC and applied an Unna boot to the leg. The ulcer on his left first metatarsal head is smaller today. The bed looks good and there is just a small amount of surrounding callus. 09/21/2021: The ulcer on his left first metatarsal head looks to be stalled. There is some callus surrounding the wound but the wound bed itself does not appear particularly dynamic. The tunnel tract on his lateral left leg seems to be roughly the same length or perhaps slightly smaller but the wound bed appears healthy with good granulation tissue. He opened up a new wound on his medial thigh and the site of a prior surgical incision. He says that he did this unconsciously in his sleep by scratching. 09/28/2021: Unfortunately, the ulcer on his left first metatarsal head has extended underneath the callus toward the dorsum of his foot. The medial thigh wounds are roughly the same. The tunnel on his lateral left leg continues to be problematic; it is longer than we are able to actually probe with a Q-tip. I am still not certain as to why Dr. Donzetta Matters did not open this up entirely when he took the patient to the operating room. We will likely be back in the same situation with just a small superficial opening in a long unhealed tract, as the open portion is granulating in nicely. 10/02/2021: The patient was initially scheduled for a nurse visit, but we are  also applying a total contact cast today. The plantar foot wound looks clean without significant accumulated callus. We have been applying Prisma silver collagen to the site. 10/05/2021: The patient is here for his first total contact cast change. We have tried using gauze packing strips in the tunnel on his lateral leg wound, but this does not seem to be working any better than the white VAC foam. The foot ulcer looks about the same with minimal periwound callus. Medial thigh wound is clean with just  some overlying eschar. 10/12/2021: The plantar foot wound is stable without any significant accumulation of periwound callus. The surface is viable with good granulation tissue. The medial thigh wounds are much smaller and are epithelializing. On the other hand, he had purulent drainage coming from the tunnel on his lateral leg. He does go back to see Dr. Donzetta Matters next week and is planning to ask him why the wound tunnel was not completely opened at the time of his most recent operation. 10/19/2021: The plantar foot wound is markedly improved and has epithelial tissue coming through the surface. The medial thigh wounds are nearly closed with just a tiny open area. He did see Dr. Donzetta Matters earlier this week and apparently they did discuss the possibility of opening the sinus tract further and enabling a wound VAC application. Apparently there are some limits as to what Dr. 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 I think if we could get the tract open to the level of the popliteal fossa, this would greatly aid in her ability to get this chart closed. That being said, however, today when I probed the tract with a Q-tip, I was not able to insert the entirety of the Q-tip as I have on previous occasions. The tunnel is shorter by about 4 cm. The surface is clean with good granulation tissue and no further episodes of purulent drainage. 10/30/2021: Last week, the patient underwent  surgery and had the long tract in his leg opened. There was a rind that was debrided, according to the operative report. His medial thigh ulcers are closed. The plantar foot wound is clean with a good surface and some built up surrounding callus. 11/06/2021: The overall dimensions of the large wound on his lateral leg remain about the same, but there is good granulation tissue present and the tunneling is a little bit shorter. He has a new wound on his anterior tibial surface, in the same location where he had a similar lesion in the past. The plantar foot wound is clean with some buildup surrounding callus. Just toward the medial aspect of his foot, however, there is an area of darkening that once debrided, revealed another opening in the skin surface. 11/13/2021: The anterior tibial surface wound is closed. The plantar foot wound has some surrounding callus buildup. The area of darkening that I debrided last week and revealed an opening in the skin surface has closed again. The tunnel in the large wound on his lateral leg has come in by about 3 cm. There is healthy granulation tissue on the entire wound surface. 11/23/2021: The patient was out of town last week and did wet-to-dry dressings on his large wound. 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 Unfortunately, he picked open the wound on his left medial thigh. He says that it was itching and he just could not stop scratching it until it was open again. The wound on his plantar foot is smaller and has not accumulated a tremendous amount of callus. The lateral leg wound is shallower and the tunnel has also decreased in depth. There is just a little bit of slough accumulation on the surface. 11/30/2021: Another portion of his left medial thigh has been opened up. All of these wounds are fairly superficial with just a little bit of slough and eschar accumulation. The wound on his plantar  foot is almost closed with just a bit of eschar and  periwound callus accumulation. The lateral leg wound is nearly flush with the surrounding skin and the tunnel is markedly shallower. 12/07/2021: There is just 1 open area on his left medial thigh. It is clean with just a little bit of perimeter eschar. The wound on his plantar foot continues to contract and just has some eschar and periwound callus accumulation. The lateral leg wound is closing at the more distal aspect and the tunnel is smaller. The surface is nearly flush with the surrounding skin and it has a good bed of granulation tissue. 12/14/2021: The thigh and foot wounds are closed. The lateral leg wound has closed over approximately half of its length. The tunnel continues to contract and the surface is now flush with the surrounding skin. The wound bed has robust granulation tissue. 12/22/2021: The thigh and foot wounds have reopened. The foot wound has a lot of callus accumulation around and over it. The thigh wound is tiny with just a little bit of slough in the wound bed. The lateral leg wound continues to contract. His vascular surgeon took the wound VAC off earlier in the week and the patient has been doing wet-to-dry dressings. There is a little slough accumulation on the surface. The tunnel is about 3 cm in depth at this point. 12/28/2021: The thigh wound is closed again. The foot wound has some callus that subsequently has peeled back exposing just a small slit of a wound. The lateral leg wound Is down to about half the size that it originally was and the tunnel is down to about half a centimeter in depth. 01/04/2022: The thigh wound remains closed. The foot wound has heavy callus overlying the wound site. Once this was debrided, the wound was found to be closed. The lateral leg wound is smaller again this week and very superficial. No tunnel could be identified. 01/12/2022: The thigh and foot wounds both remain closed. The lateral leg  wound is now nearly flush with the skin surface. There is good granulation tissue present with a light layer of slough. 01/19/2022: Due to the way his wrap was placed, the patient did not change the dressing on his thigh at all and so the foam was saturated and his skin is macerated. There is a light layer of slough on the wound surface. The underlying granulation tissue is robust and healthy-appearing. He has heavy callus buildup at the site of his first metatarsal head wound which is still healed. 02/01/2022: He has been in silver alginate. When he removed the dressing from his thigh wound, however, some leg, superficially reopening a portion of the wound that had healed. In addition, underneath the callus at his left first metatarsal head, there appears to be a blister and the wound appears to be open again. EDD, ROWDEN ([id number]) [id number] Page 5 of 19 02/08/2022: The lateral leg wound has contracted substantially. There is eschar and a light layer of slough present. He says that it is starting to pull and is uncomfortable. On inspection, there is some puckering of the scar and the eschar is quite dry; this may account for his symptoms. On his first metatarsal head, the wound is much smaller with just some eschar on the surface. The callus has not reaccumulated. He reports that he had a blister come up on his medial thigh wound at the distal aspect. It popped and there is now an opening in his skin again. Looking back through his [place] of wound photos, there is what looks like a  permanent suture just deep to this location and it may be trying to erode through. We have been using silver alginate on his wounds. 02/15/2022: The lateral leg wound is about half the size it was last week. It is clean with just a little perimeter eschar and light slough. The wound on his first metatarsal head is about the same with heavy callus overlying it. The medial thigh wound is closed  again. He does have some skin changes on the top of his foot that looks potentially yeast related. 02/22/2022: The skin on the top of his foot improved with the use of a topical antifungal. The lateral leg wound continues to contract and is again smaller this week. There is a little bit of slough and eschar on the surface. The first metatarsal head wound is a little bit smaller but has reaccumulated a thick callus over the top. He decided to try to trim his toenail and ultimately took the entire nail off of his left great toe. 03/02/2022: His lateral leg wound continues to improve, as does the wound on his left great toe. Unfortunately, it appears that somehow his foot got wet and moisture seeped in through the opening causing his skin to lift. There is a large wound now overlying his first metatarsal on both the plantar, medial, and dorsal portion of his foot. There is necrotic tissue and slough present underneath the shaggy macerated skin. 03/08/2022: The lateral leg wound is smaller again today. There is just a light layer of slough and eschar on the surface. The great toe wound is smaller again today. The first metatarsal wound is a little bit smaller today and does not look nearly as necrotic and macerated. There is still slough and nonviable tissue present. 03/15/2022: The lateral leg wound is narrower and just has a little bit of light slough buildup. The first metatarsal wound still has a fair amount of moisture affecting the periwound skin. The great toe wound is healed. 03/22/2022: The lateral leg wound is now isolated to just at the level of his knee. There is some eschar and slough accumulation. The first metatarsal head wound has epithelialized tremendously and is about half the size that it was last week. He still has some maceration on the top of his foot and a fungal odor is present. 03/29/2022: T oday the patient's foot was macerated, suggesting that the cast got wet. The patient has also  been picking at his dry skin and has enlarged the wound on his left lateral leg. In the time between having his cast removed and my evaluation, he had picked more dry skin and opened up additional wounds on his Achilles area and dorsal foot. The plantar first metatarsal head wound, however, is smaller and clean with just macerated callus around the perimeter and light slough on the surface. The lateral leg wound measured a little bit larger but is also fairly clean with eschar and minimal slough. 04/02/2022: The patient had vascular studies done last Friday and so his cast was not applied. He is here today to have that done. 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 04/05/2022: Both wounds are smaller and quite clean. There is just a little biofilm on the lateral leg wound. 10/20; the patient has a wound on the left lateral surgical incision at the level of his lateral knee this looks clean and improved. He is using silver alginate. He also has an area on his left medial foot for which she is using  Hydrofera Blue under a total contact cast both wounds are measuring smaller 04/20/2022: The plantar foot wound has contracted considerably and is very close to closing. The lateral leg wound was measured a little larger, but there was a tiny open area that was included in the measurements that was not included last week. He has some eschar around the perimeter but otherwise the wound looks clean. 04/27/2022: The lateral leg wound looks better this week. He says that midweek, he felt it was very dry and began applying hydrogel to the site. I think this was beneficial. The foot wound is nearly closed underneath a thick layer of dry skin and callus. 05/04/2022: The foot wound is healed. He has developed a new small ulcer on his anterior tibial surface about midway up his leg. It has a little slough on the surface. The lateral leg wound still is fairly dry, but clean with just a little biofilm on the  surface. 05/11/2022: The wound on his foot reopened on Wednesday. A large blister formed which then broke open revealing the fat layer underneath. The ulcer on his anterior tibial surface is a little bit larger this week. The lateral leg wound has much better moisture balance this week. Fortunately, prior to his foot wound reopening, he did get the cast made for his orthotic. 05/15/2022: Already, the left medial foot wound has improved. The tissue is less macerated and the surface is clean. The ulcer on his anterior tibial surface continues to enlarge. This seems likely secondary to accumulated moisture. The lateral leg wound continues to have an improved moisture balance with the use of collagen. 05/25/2022: The medial foot wound continues to contract. It is now substantially smaller with just a little slough on the surface. The anterior tibial surface wound continues to enlarge further. Once again, this seems to be secondary to moisture. The lateral leg wound does not seem to be changing much in size, but the moisture balance is better. 06/01/2022: The anterior tibial wound is closed. The medial foot wound is down to just a very small, couple of millimeters, opening. The lateral leg wound has good moisture balance, but remains unchanged in size. 12/15; the patient's anterior tibial wound has reopened, however the area on his right first metatarsal head is closed. The major wound is actually on the superior part of his surgical wound in the left lateral thigh. Not a completely viable surface under illumination. This may at some point require a debridement I think he is currently using Prisma. As noted the left medial foot wound has closed 06/14/2022: The anterior tibial wound has closed. The lateral leg wound has a better surface but is basically unchanged in size. The left medial foot wound has reopened. It looks as though there was some callus accumulation and moisture got under the callus which  caused the tissue to break down again. 06/21/2022: A new wound has opened up just distal to the previous anterior tibial wound. It is small but has hypertrophic granulation tissue present. The lateral leg wound is a little bit narrower and has a layer of slough on the surface. The left medial foot wound is down to just a pinhole. His custom orthotics should be available next week. 06/28/2022: The wound on his first metatarsal head has healed. He has developed a new small wound on his medial lower leg, in an old scar site. The lateral leg wound continues to contract but continues to accumulate slough, as well. 07/03/2022: Despite wearing his custom orthopedic shoes,  he managed to reopen the wound on his first metatarsal head. He says he thinks his foot got wet and then some skin lifted up and he peeled this away. Both of the lower leg wounds are smaller and have some dry eschar on the surface. The lateral leg wound is quite a bit narrower today. 07/12/2022: The medial lower leg wound is closed. The anterior lower leg wound has contracted considerably. The lateral upper leg wound is narrower with a layer of slough on the surface. The first metatarsal head wound is also smaller, but had copious drainage which saturated the foam border dressing and resulted in some periwound tissue maceration. Fortunately there was no breakdown at this site. 07/19/2022: The lower leg shows signs of significant maceration; I think he must be sweating excessively inside his cast. There are several areas of skin ZAAVAN, SIPP ([id number]) [id number] Page 6 of 19 breakdown present. The wound on his foot is smaller and that on his lateral leg is narrower and is shorter by about a centimeter. 07/26/2022: Last week we used a zinc Coflex wrap prior to applying his total contact cast and this has had the effect of keeping his skin from getting macerated this week. The anterior leg wound has epithelialized  substantially. The lateral leg wound is significantly smaller with just a bit of slough on the surface. The first metatarsal head wound is also smaller this week. 08/02/2022: The anterior leg wound was closed on arrival, but while he was sitting in the room, he picked it open again. The lateral leg wound is smaller with just a little slough on the surface and the first metatarsal head wound has contracted further, as well. 08/09/2022: The first metatarsal head wound is covered with callus. Underneath the callus, it is nearly completely closed. The lateral leg wound is smaller again this week. The anterior leg wound looks better, but he has such heavy buildup of old skin, that moisture is getting underneath this, becoming trapped, and causing the underlying skin to get macerated and open up. Electronic Signature(s) Signed: 08/09/2022 10:59:52 AM By: Fredirick Maudlin MD FACS Entered By: Fredirick Maudlin on 08/09/2022 10:59:52 -------------------------------------------------------------------------------- Physical Exam Details Patient Name: Date of Service: [name]. 08/09/2022 10:00 A M Medical Record Number: [id number] Patient Account Number: [id number] Date of Birth/Sex: Treating RN: 1951/02/23 (72 y.o. M) Primary Care Provider: [name] Other Clinician: Referring Provider: Treating Provider/Extender: [name] in Treatment: 52 Constitutional no acute distress. Respiratory Normal work of breathing on room air. Notes 08/09/2022: The first metatarsal head wound is covered with callus. Underneath the callus, it is nearly completely closed. The lateral leg wound is smaller again this week. The anterior leg wound looks better, but he has such heavy buildup of old skin, that moisture is getting underneath this, becoming trapped, and causing the underlying skin to get macerated and open up. Electronic Signature(s) Signed: 08/09/2022 11:00:18 AM By: Fredirick Maudlin MD FACS Entered By: Fredirick Maudlin on 08/09/2022 11:00:18 -------------------------------------------------------------------------------- Physician Orders Details Patient Name: Date of Service: [name]. 08/09/2022 10:00 A M Medical Record Number: [id number] Patient Account Number: [id number] Date of Birth/Sex: Treating RN: 19-Jun-1951 (72 y.o. [name] Primary Care Provider: [name] Other Clinician: Referring Provider: Treating Provider/Extender: [name] in Treatment: 40 Verbal / Phone Orders: No Diagnosis Coding ICD-10 Coding Code Description 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.828 Non-pressure chronic ulcer of  other part of left lower leg with other specified severity E11.621 Type 2 diabetes mellitus with foot ulcer E11.51 Type 2 diabetes mellitus with diabetic peripheral angiopathy without gangrene TRI, NOWLING ([id number]) [id number] Page 7 of 19 I89.0 Lymphedema, not elsewhere classified I87.322 Chronic venous hypertension (idiopathic) with inflammation of left lower extremity Follow-up Appointments ppointment in 1 week. - Dr Celine Ahr - Room 2 Return A Anesthetic Wound #22 Left,Proximal,Lateral Lower Leg (In clinic) Topical Lidocaine 4% applied to wound bed Bathing/ Shower/ Hygiene May shower with protection but do not get wound dressing(s) wet. Protect dressing(s) with water repellant cover (for example, large plastic bag) or a cast cover and may then take shower. Edema Control - Lymphedema / SCD / Other Avoid standing for long periods of time. Patient to wear own compression stockings every day. - on right leg; Moisturize legs daily. Compression stocking or Garment 20-30 mm/Hg pressure to: - left leg daily Off-Loading Total Contact Cast to Left Lower Extremity Other: - minimal weight bearing left foot Wound Treatment Wound  #18R - Metatarsal head first Wound Laterality: Plantar, Left Cleanser: Soap and Water 1 x Per Week/30 Days Discharge Instructions: May shower and wash wound with dial antibacterial soap and water prior to dressing change. Cleanser: Wound Cleanser 1 x Per Week/30 Days Discharge Instructions: Cleanse the wound with wound cleanser prior to applying a clean dressing using gauze sponges, not tissue or cotton balls. Prim Dressing: Sorbalgon AG Dressing 2x2 (in/in) 1 x Per Week/30 Days ary Discharge Instructions: Apply to wound bed as instructed Secondary Dressing: Zetuvit Plus Silicone Border Dressing 4x4 (in/in) 1 x Per Week/30 Days Discharge Instructions: Apply silicone border over primary dressing as directed. Wound #22 - Lower Leg Wound Laterality: Left, Lateral, Proximal Cleanser: Soap and Water 3 x Per Week/30 Days Discharge Instructions: May shower and wash wound with dial antibacterial soap and water prior to dressing change. Cleanser: Wound Cleanser 3 x Per Week/30 Days Discharge Instructions: Cleanse the wound with wound cleanser prior to applying a clean dressing using gauze sponges, not tissue or cotton balls. Peri-Wound Care: Sween Lotion (Moisturizing lotion) 3 x Per Week/30 Days Discharge Instructions: Apply moisturizing lotion to the leg Topical: Skintegrity Hydrogel 4 (oz) 3 x Per Week/30 Days Discharge Instructions: Apply hydrogel as directed Prim Dressing: Endoform 2x2 in 3 x Per Week/30 Days ary Discharge Instructions: Moisten with Hydrogel or saline Secondary Dressing: ABD Pad, 8x10 (Generic) 3 x Per Week/30 Days Discharge Instructions: Apply over primary dressing as directed. Secured With: Elastic Bandage 4 inch (ACE bandage) 3 x Per Week/30 Days Discharge Instructions: Secure with ACE bandage as directed. Wound #30 - Lower Leg Wound Laterality: Left, Anterior Cleanser: Soap and Water 1 x Per Week/30 Days Discharge Instructions: May shower and wash wound with dial  antibacterial soap and water prior to dressing change. Cleanser: Wound Cleanser 1 x Per Week/30 Days Discharge Instructions: Cleanse the wound with wound cleanser prior to applying a clean dressing using gauze sponges, not tissue or cotton balls. Prim Dressing: Sorbalgon AG Dressing 2x2 (in/in) 1 x Per Week/30 Days ary Discharge Instructions: Apply to wound bed as instructed Secondary Dressing: Zetuvit Plus Silicone Border Dressing 4x4 (in/in) 1 x Per Week/30 Days Discharge Instructions: Apply silicone border over primary dressing as directed. Compression Wrap: CoFlex TLC Zinc 1 x Per Week/30 Days DARRIO, SPHAR ([id number]) [phone].pdf Page 8 of 19 Electronic Signature(s) Signed: 08/09/2022 12:30:57 PM By: Fredirick Maudlin MD FACS Entered By: Fredirick Maudlin on 08/09/2022 11:00:40 -------------------------------------------------------------------------------- Problem List Details Patient  Name: Date of Service: STEWART, BRUZEK 08/09/2022 10:00 A M Medical Record Number: [id number] Patient Account Number: [id number] Date of Birth/Sex: Treating RN: 1951/03/27 (72 y.o. M) Primary Care Provider: [name] Other Clinician: Referring Provider: Treating Provider/Extender: [name] in Treatment: 69 Active Problems ICD-10 Encounter Code Description Active Date MDM Diagnosis L97.528 Non-pressure chronic ulcer of other part of left foot with other specified 04/12/2021 No Yes severity L97.828 Non-pressure chronic ulcer of other part of left lower leg with other specified 04/12/2021 No Yes severity E11.621 Type 2 diabetes mellitus with foot ulcer 04/12/2021 No Yes E11.51 Type 2 diabetes mellitus with diabetic peripheral angiopathy without gangrene 04/12/2021 No Yes I89.0 Lymphedema, not elsewhere classified 04/12/2021 No Yes I87.322 Chronic venous hypertension (idiopathic) with inflammation of left lower 04/12/2021 No  Yes extremity Inactive Problems ICD-10 Code Description Active Date Inactive Date L97.828 Non-pressure chronic ulcer of other part of left lower leg with other specified severity 06/08/2022 06/08/2022 E11.42 Type 2 diabetes mellitus with diabetic polyneuropathy 04/12/2021 04/12/2021 L02.416 Cutaneous abscess of left lower limb 06/13/2021 06/13/2021 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 07/20/2021 07/20/2021 Resolved Problems DUVAN, BENZIE ([id number]) [id number].pdf Page 9 of 19 Electronic Signature(s) Signed: 08/09/2022 10:56:15 AM By: Fredirick Maudlin MD FACS Entered By: Fredirick Maudlin on 08/09/2022 10:56:15 -------------------------------------------------------------------------------- Progress Note Details Patient Name: Date of Service: [name]. 08/09/2022 10:00 A M Medical Record Number: [id number] Patient Account Number: [id number] Date of Birth/Sex: Treating RN: Apr 17, 1951 (72 y.o. M) Primary Care Provider: [name] Other Clinician: Referring Provider: Treating Provider/Extender: [name] in Treatment: 69 Subjective Chief Complaint Information obtained from Patient Left leg and foot ulcers 04/12/2021; patient is here for wounds on his left lower leg and left plantar foot over the first metatarsal head History of Present Illness (HPI) 10/11/17; Mr. Tannenbaum is a 72 year old man who tells me that in 2015 he slipped down the latter traumatizing his left leg. He developed a wound in the same spot the area that we are currently looking at. He states this closed over for the most part although he always felt it was somewhat unstable. In 2016 he hit the same area with the door of his car had this reopened. He tells me that this is never really closed although sometimes an inflow it remains open on a constant basis. He has not been using any specific dressing to this except for topical  antibiotics the nature of which were not really sure. His primary doctor did send him to see Dr. [name] of interventional cardiology. He underwent an angiogram on 08/06/17 and he underwent a PTA and directional atherectomy of the lesser distal SFA and popliteal arteries which resulted in brisk improvement in blood flow. It was noted that he had 2 vessel runoff through the anterior tibial and peroneal. He is also been to see vascular and interventional radiologist. He was not felt to have any significant superficial venous insufficiency. Presumably is not a candidate for any ablation. It was suggested he come here for wound care. The patient is a type II diabetic on insulin. He also has a history of venous insufficiency. ABIs on the left were noncompressible in our clinic 10/21/17; patient we admitted to the clinic last week. 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 We put Iodosorb on him after an aggressive debridement and 3 layer compression. He complained of pain in  his ankle and itching with is skin in fact he scratched the area on the medial calf superiorly at the rim of our wraps and he has 2 small open areas in that location today which are new. I changed his primary dressing today to silver collagen. As noted he is already had revascularization and does not have any significant superficial venous insufficiency that would be amenable to ablation 10/28/17; patient admitted to the clinic 2 weeks ago. He has a smaller Wound. Scratch injury from last week revealed. There is large wound over the tibial area. This is smaller. Granulation looks healthy. No need for debridement. 11/04/17; the wound on the left lateral calf looks better. Improved dimensions. Surface of this looks better. We've been maintaining him and Kerlix Coban wraps. He finds this much more comfortable. Silver collagen dressing 11/11/17; left lateral Wound continues to  look healthy be making progress. Using a #5 curet I removed removed nonviable skin from the surface of the wound and then necrotic debris from the wound surface. Surface of the wound continues to look healthy. ooHe also has an open area on the left great toenail bed. We've been using topical antibiotics. 11/19/17; left anterior lateral wound continues to look healthy but it's not closed. ooHe also had a small wound above this on the left leg ooInitially traumatic wounds in the setting of significant chronic venous insufficiency and stasis dermatitis 11/25/17; left anterior wounds superiorly is closed still a small wound inferiorly. 12/02/17; left anterior tibial area. Arrives today with adherent callus. Post debridement clearly not completely closed. Hydrofera Blue under 3 layer compression. 12/09/17; left anterior tibia. Circumferential eschar however the wound bed looks stable to improved. We've been using Hydrofera Blue under 3 layer compression 12/17/17; left anterior tibia. Apparently this was felt to be closed however when the wrap was taken off there is a skin tear to reopen wounds in the same area we've been using Hydrofera Blue under 3 layer compression 12/23/17 left anterior tibia. Not close to close this week apparently the [facility] was stuck to this again. Still circumferential eschar requiring debridement. I put a contact layer on this this time under the Hydrofera Blue 12/31/17; left anterior tibia. Wound is better slight amount of hyper-granulation. Using Hydrofera Blue over Adaptic. 01/07/18; left anterior tibia. The wound had some surface eschar however after this was removed he has no open wound.he was already revascularized by Dr. [name] when he came to our clinic with atherectomy of the left SFA and popliteal artery. He was also sent to interventional radiology for venous reflux studies. He was not felt to have significant reflux but certainly has chronic venous changes of his skin  with hemosiderin deposition around this area. He will definitely need to lubricate his skin and wear compression stocking and I've talked to him about this. READMISSION 05/26/2018 This is a now 72 year old man we cared for with traumatic wounds on his left anterior lower extremity. He had been previously revascularized during that admission by Dr. [name]. Apparently in follow-up Dr. [name] noted that he had deterioration in his arterial status. He underwent a stent placement in the distal left SFA on 04/22/2018. 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 He went back to the angiography suite on 04/30/2018 he underwent PTA and balloon angioplasty of the occluded left mid anterior tibial artery, thrombotic occlusion went from 100 to 0% which reconstitutes the posterior tibial artery. He had thrombectomy and aspiration of the peroneal artery. The stent placed in the distal SFA  left SFA was still occluded. 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 He has been recuperating at [facility] after the most recent hospitalization. He is going home tomorrow. He tells me that 2 weeks ago he traumatized the tip of his left fifth toe. He came in urgently for our review of this. This was a history of before I noted that Dr. [name] had already noted dry gangrenous changes of the left fifth toe ARMONI, GALANTE ([id number]) [phone].pdf Page 10 of 19 06/09/2018; 2-week follow-up. I did contact Dr. [name] after his last appointment and he apparently saw 1 of Dr. Irven Shelling colleagues the next day. He does not follow-up with Dr. [name] himself until Thursday of this week. He has dry gangrene on the tip of most of his  left fifth toe. Nevertheless there is no evidence of infection no drainage and no pain. He had a new area that this week when we were signing him in today on the left anterior mid tibia area, this is in close proximity to the previous wound we have dealt with in this clinic. 06/23/2018; 2-week follow-up. I did not receive a recent note from Dr. [name] to review today. Our office is trying to obtain this. He is apparently not planning to do further vascular interventions and wondered about compression to try and help with the patient's chronic venous insufficiency. However we are also concerned about the arterial flow. ooHe arrives in clinic today with a new area on the left third toe. The areas on the calf/anterior tibia are close to closing. The left fifth toe is still mummified using Betadine. -In reviewing things with the patient he has what sounds like claudication with mild to moderate amount of activity. 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 I prescribed Levaquin over the phone while we attempted to arrange a plan of care. However the patient called yesterday to report he had low-grade fever and he came in today acutely. There is been a marked deterioration in the left third toe with spreading cellulitis up into the dorsal left foot. He was referred to the emergency room. Readmission: 06/29/2020 patient presents today for reevaluation here in our clinic he was previously treated by Dr. Dellia Nims at the latter part of 2019 in 2 the beginning of 2020. Subsequently we have not seen him since that time in the interim he did have evaluation with vein and vascular specialist specifically Dr. Anice Paganini who did perform quite extensive work for a left femoral to anterior tibial artery bypass. With that being said in the interim the patient has developed significant lymphedema and has wounds that he tells me have really never healed in regard to the incision site on the  left leg. He also has multiple wounds on the feet for various reasons some of which is that he tends to pick at his feet. Fortunately there is no signs of active infection systemically at this time he does have some wounds that are little bit deeper but most are fairly superficial he seems to have good blood flow and overall everything appears to be healthy I see no bone exposed and no obvious signs of osteomyelitis. I do not know that  he necessarily needs a x-ray at this point although that something we could consider depending on how things progress. The patient does have a history of lymphedema, diabetes, this is type II, chronic kidney disease stage III, hypertension, and history of peripheral vascular disease. 07/05/2020; patient admitted last week. Is a patient I remember from 2019 he had a spreading infection involving the left foot and we sent him to the hospital. 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 He subsequently had a left femoral to anterior tibial bypass by Dr.Cain vein and vascular. He also has severe lymphedema with chronic skin changes related to that on the left leg. The most problematic area that was new today was on the left medial great toe. This was apparently a small area last week there was purulent drainage which our intake nurse cultured. Also areas on the left medial foot and heel left lateral foot. He has 2 areas on the left medial calf left lateral calf in the setting of the severe lymphedema. 07/13/2020 on evaluation today patient appears to be doing better in my opinion compared to his last visit. The good news is there is no signs of active infection systemically and locally I do not see any signs of infection either. He did have an x-ray which was negative that is great news he had a culture which showed MRSA but at the same time he is been on the doxycycline which has helped. I do think we may want to extend this for 7  additional days 1/25; patient admitted to the clinic a few weeks ago. 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 We have been putting him under compression his edema control is a lot better but he is severe verricused skin on the left leg. He is really done quite well he still has an open area on the left medial calf and the left medial first metatarsal head. We have been using silver collagen on the leg silver alginate on the foot 07/27/2020 upon evaluation today patient appears to be doing decently well in regard to his wounds. He still has a lot of dry skin on the left leg. Some of this is starting to peel back and I think he may be able to have them out by removing some that today. Fortunately there is no signs of active infection at this time on the left leg although on the right leg he does appear to have swelling and erythema as well as some mild warmth to touch. This does have been concerned about the possibility of cellulitis although within the differential diagnosis I do think that potentially a DVT has to be at least considered. We need to rule that out before proceeding would just call in the cellulitis. Especially since he is having pain in the posterior aspect of his calf muscle. 2/8; the patient had seen sparingly. He has severe skin changes of chronic lymphedema in the left leg thickened hyperkeratotic verrucous skin. He has an open wound on the medial part of the left first met head left mid tibia. He also has a rim of nonepithelialized skin in the anterior mid tibia. He brought in the AmLactin lotion that was been prescribed although I am not sure under compression and its utility. There concern about cellulitis on the right lower leg the last time he was here. He was put on on antibiotics. His DVT rule out was negative.  The right leg looks fine he is using his stocking on this area 08/10/2020 upon evaluation today patient appears to be doing  well with regard to his leg currently. He has been tolerating the dressing changes without complication. Fortunately there is no signs of active infection which is great news. Overall very pleased with where things stand. 2/22; the patient still has an area on the medial part of the left first met his head. This looks better than when I last saw this earlier this month he has a rim of epithelialization but still some surface debris. Mostly everything on the left leg is healed. There is still a vulnerable in the left mid tibia area. 08/30/2020 upon evaluation today patient appears to be doing much better in regard to his wounds on his foot. 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 Nonetheless I think we will address that today I Minna send in a prescription for him in that regard. Overall though there does not appear to be any signs of significant worsening. 09/07/2020 on evaluation today patient's wounds over his left foot appear to be doing excellent. I do not see any signs of infection there is some callus buildup this can require debridement for certain but overall I feel like he is managing quite nicely. He still using the AmLactin cream which has been beneficial for him as well. 3/22; left foot wound is closed. There is no open area here. He is using ammonium lactate lotion to the lower extremities to help exfoliate dry cracked skin. He has compression stockings from elastic therapy in [place]. The wound on the medial part of his left first met head is healed today. READMISSION 04/12/2021 Mr. Franko is a patient we know fairly well he had a prolonged stay in clinic in 2019 with wounds on his left lateral and left anterior lower extremity in the setting of chronic venous insufficiency. More recently he was here earlier this year with predominantly an area on his  left foot first metatarsal head plantar and he says the plantar foot broke down on its not long after we discharged him but he did not come back here. The last few months areas of broken down on his left anterior and again the left lateral lower extremity. The leg itself is very swollen chronically enlarged a lot of hyperkeratotic dry Berry Q skin in the left lower leg. His edema extends well into the thigh. He was seen by Dr. Donzetta Matters. He had ABIs on 03/02/2021 showing an ABI on the right of 1 with a TBI of 0.72 his ABI in the left at 1.09 TBI of 0.99. Monophasic and biphasic waveforms on the right. On the left monophasic waveforms were noted he went on to have an angiogram on 03/27/2021 this showed the aortic aortic and iliac segments were free of flow-limiting stenosis the left common femoral vein to evaluate the left femoral to anterior tibial artery bypass was unobstructed the bypass was patent without any areas of stenosis. We discharged the patient in bilateral juxta lite stockings but very clearly that was not sufficient to control the swelling and maintain skin integrity. He is clearly going to need compression pumps. 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 This is fortunate because he is on his feet for long  periods of time. 10/27; patient comes in with our intake nurse reporting copious amount of green drainage from the left anterior mid tibia the left dorsal foot and to a lesser extent the left medial mid tibia. We left the compression wrap on all week for the amount of edema in his left leg is quite a bit better. We use silver alginate as the primary dressing 11/3; edema control is good. Left anterior lower leg left medial lower leg and the plantar first metatarsal head. The left anterior lower leg required debridement. Deep tissue culture I did of this wound showed MRSA I put him on 10 days of doxycycline which she will start today. We  have him in compression wraps. He LADAMION, KEUSCH ([id number]) [id number] Page 11 of 19 has a security card and [employer] however he is retiring on November 15. We will need to then get him into a better offloading boot for the left foot perhaps a total contact cast 11/10; edema control is quite good. Left anterior and left medial lower leg wounds in the setting of chronic venous insufficiency and lymphedema. He also has a substantial area over the left plantar first metatarsal head. 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 He has significant hypergranulation on the left plantar foot wound. The patient is a diabetic but he does not have significant PAD 11/17; edema control is quite good. Left anterior and left medial lower leg wounds look better. The really concerning area remains the area on the left plantar first metatarsal head. He has a rim of epithelialization. 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 Starting today with a forefoot off loader but . possibly a total contact cast. 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 Arrives with a very significant odor from I think all of the wounds on the left leg and the left foot. Because of this I did not put a total contact cast on him today but will could still consider this. His wife was having cataract surgery which is the reason he missed the appointment 12/6. I saw this man 5 days ago with a swelling below the popliteal fossa. I thought he actually might have a Baker's cyst however the DVT rule out study that we could arrange right away was negative the technician told me this was not a ruptured Baker's cyst.  We attempted to get this aspirated by under ultrasound guidance in interventional radiology however all they did was an ultrasound however it shows an extensive fluid collection 62 x 8 x 9.4 in the left thigh and left calf. The patient states he thinks this started 8 days ago or so but he really is not complaining of any pain, fever or systemic symptoms. He has not ha 12/20; after some difficulty I managed to get the patient into see Dr. Donzetta Matters. Eventually he was taken into the hospital and had a drain put in the fluid collection below his left knee posteriorly extending into the posterior thigh. He still has the drain in place. Culture of this showed moderate staff aureus few Morganella and few Klebsiella he is now on doxycycline and ciprofloxacin as suggested  by infectious disease he is on this for a month. The drain will remain in place until it stops draining 12/29; he comes in today with the 1 wound on his left leg and the area on the left plantar first met head significantly smaller. Both look healthy. He still has the drain in the left leg. He says he has to change this daily. Follows up with Dr. Donzetta Matters on January 11. 06/29/2021; the wounds that I am following on the left leg and left first met head continued to be quite healthy. However the area where his inferior drain is in place had copious amounts of drainage which was green in color. The wound here is larger. Follows up with Dr. Gwenlyn Saran of vein and vascular his surgeon next week as well as infectious disease. He remains on ciprofloxacin and doxycycline. He is not complaining of excessive pain in either one of the drain areas 1/12; the patient saw vascular surgery and infectious disease. Vascular surgery has left the drain in place as there was still some notable drainage still see him back in 2 weeks. Dr. Velna Ochs stop the doxycycline and ciprofloxacin and I do not believe he follows up with them at this point. Culture I did last week showed both  doxycycline resistant MRSA and Pseudomonas not sensitive to ciprofloxacin although only in rare titers 1/19; the patient's wound on the left anterior lower leg is just about healed. We have continued healing of the area that was medially on the left leg. Left first plantar metatarsal head continues to get smaller. The major problem here is his 2 drain sites 1 on the left upper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

## 2022-08-11 NOTE — Progress Notes (Signed)
JERRIE, ERLING ([id number]) [id number] Page 1 of 10 Visit Report for 08/09/2022 Arrival Information Details Patient Name: Date of Service: [name], [name] 08/09/2022 10:00 A M Medical Record Number: [id number] Patient Account Number: [id number] Date of Birth/Sex: Treating RN: 12/17/1950 (72 y.o. [name] Primary Care Athina Fahey: [name] Other Clinician: Referring Jefrey Raburn: Treating Shaheem Pichon/Extender: [name] in Treatment: 92 Visit Information History Since Last Visit Added or deleted any medications: No Patient Arrived: Ambulatory Any new allergies or adverse reactions: No Arrival Time: 10:00 Had a fall or experienced change in No Accompanied By: self activities of daily living that may affect Transfer Assistance: None risk of falls: Patient Identification Verified: Yes Signs or symptoms of abuse/neglect since last visito No Secondary Verification Process Completed: Yes Hospitalized since last visit: No Patient Requires Transmission-Based Precautions: No Implantable device outside of the clinic excluding No Patient Has Alerts: Yes cellular tissue based products placed in the center since last visit: Has Dressing in Place as Prescribed: Yes Pain Present Now: No Electronic Signature(s) Signed: 08/09/2022 5:14:21 PM By: Blanche East RN Entered By: Blanche East on 08/09/2022 10:00:31 -------------------------------------------------------------------------------- Encounter Discharge Information Details Patient Name: Date of Service: [name]. 08/09/2022 10:00 A M Medical Record Number: [id number] Patient Account Number: [id number] Date of Birth/Sex: Treating RN: [birth year] (72 y.o. [name] Primary Care Keshon Markovitz: [name] Other Clinician: Referring Kalman Nylen: Treating Dameer Speiser/Extender: [name] in Treatment: 71 Encounter Discharge Information Items Discharge  Condition: Stable Ambulatory Status: Ambulatory Discharge Destination: Home Transportation: Private Auto Accompanied By: self Schedule Follow-up Appointment: Yes Clinical Summary of Care: Electronic Signature(s) Signed: 08/09/2022 12:17:18 PM By: Blanche East RN Entered By: Blanche East on 08/09/2022 12:17:18 [name] ([id number] Page 2 of 10 -------------------------------------------------------------------------------- Lower Extremity Assessment Details Patient Name: Date of Service: [name], [name] 08/09/2022 10:00 A M Medical Record Number: [id number] Patient Account Number: [id number] Date of Birth/Sex: Treating RN: [birth year] (72 y.o. [name] Primary Care Jacqulin Brandenburger: [name] Other Clinician: Referring Breonna Gafford: Treating Dulce Martian/Extender: [name] in Treatment: 69 Edema Assessment Assessed: [Left: No] [Right: No] Edema: [Left: Ye] [Right: s] Calf Left: Right: Point of Measurement: 41 cm From Medial Instep 49 cm Ankle Left: Right: Point of Measurement: 10 cm From Medial Instep 31.8 cm Vascular Assessment Pulses: Dorsalis Pedis Palpable: [Left:Yes] Electronic Signature(s) Signed: 08/09/2022 5:14:21 PM By: Blanche East RN Entered By: Blanche East on 08/09/2022 10:19:31 -------------------------------------------------------------------------------- Multi Wound Chart Details Patient Name: Date of Service: [name]. 08/09/2022 10:00 A M Medical Record Number: [id number] Patient Account Number: [id number] Date of Birth/Sex: Treating RN: 1951/01/08 (72 y.o. 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 Primary Etiology: Extremity Glaucoma, Sleep Apnea, Glaucoma, Sleep Apnea, Glaucoma, Sleep Apnea, Comorbid History: Hypertension, Peripheral Arterial Hypertension, Peripheral Arterial Hypertension, Peripheral Arterial Disease, Peripheral Venous Disease, Disease, Peripheral Venous Disease, Disease, Peripheral Venous Disease, Type II Diabetes, Gout, Osteoarthritis, Type II Diabetes, Gout, Osteoarthritis, Type II Diabetes, Gout, Osteoarthritis, [name] ([id number].pdf Page 3 of 10 Neuropathy Neuropathy Neuropathy 08/23/2020 06/03/2021 06/21/2022 Date Acquired: 37 61 7 Weeks of Treatment: Open Open Open Wound Status: Yes No No Wound Recurrence: No Yes No Clustered Wound: N/A 3 N/A Clustered  Quantity: 0.1x0.1x0.1 2.2x0.8x0.1 0.1x0.1x0.1 Measurements L x W x D (cm) 0.008 1.382 0.008 A (cm) : rea 0.001 0.138 0.001 Volume (cm) : 99.90% 16.20% 98.00% % Reduction in Area: 100.00% 89.50% 97.40% % Reduction in Volume: Grade 2 Full Thickness With Exposed Support Full Thickness Without Exposed Classification: Structures Support Structures Medium Medium Medium Exudate A mount: Serosanguineous Serosanguineous Serous Exudate Type: red, brown red, brown amber Exudate Color: Flat and Intact Fibrotic scar, thickened scar Distinct, outline attached Wound Margin: Small (1-33%) Large (67-100%) Medium (34-66%) Granulation A mount: Red Red, Pink Red Granulation Quality: Large (67-100%) Small (1-33%) Medium (34-66%) Necrotic A mount: Adherent Slough Eschar, Adherent Slough Adherent Slough Necrotic Tissue: Fat Layer (Subcutaneous Tissue): Yes Fat Layer (Subcutaneous Tissue): Yes Fat Layer (Subcutaneous Tissue): Yes Exposed Structures: Fascia: No Fascia: No Fascia: No Tendon: No Tendon: No Tendon: No Muscle: No Muscle: No Muscle: No Joint: No Joint: No Joint:  No Bone: No Bone: No Bone: No Small (1-33%) Medium (34-66%) Large (67-100%) Epithelialization: Debridement - Selective/Open Wound N/A N/A Debridement: Pre-procedure Verification/Time Out 10:38 N/A N/A Taken: Callus N/A N/A Tissue Debrided: Skin/Epidermis N/A N/A Level: 0.24 N/A N/A Debridement A (sq cm): rea Curette N/A N/A Instrument: Minimum N/A N/A Bleeding: Pressure N/A N/A Hemostasis A chieved: Procedure was tolerated well N/A N/A Debridement Treatment Response: 0.6x0.4x0.1 N/A N/A Post Debridement Measurements L x W x D (cm) 0.019 N/A N/A Post Debridement Volume: (cm) Callus: Yes Scarring: Yes No Abnormalities Noted Periwound Skin Texture: Dry/Scaly: Yes Dry/Scaly: Yes Maceration: Yes Periwound Skin Moisture: Maceration: No No Abnormalities Noted Hemosiderin Staining: Yes Hemosiderin Staining: Yes Periwound Skin Color: No Abnormality No Abnormality No Abnormality Temperature: N/A Yes N/A Tenderness on Palpation: Debridement N/A N/A Procedures Performed: Treatment Notes Electronic Signature(s) Signed: 08/09/2022 10:57:29 AM By: Fredirick Maudlin MD FACS Entered By: Fredirick Maudlin on 08/09/2022 10:57:29 -------------------------------------------------------------------------------- Multi-Disciplinary Care Plan Details Patient Name: Date of Service: [name]. 08/09/2022 10:00 A M Medical Record Number: [id number] Patient Account Number: [id number] Date of Birth/Sex: Treating RN: 23-Oct-1950 (72 y.o. [name] Primary Care Mayreli Alden: [name] Other Clinician: Referring Wash Nienhaus: Treating Norina Cowper/Extender: [name] in Treatment: 72 [name] reviewed with physician Active Inactive Venous Leg Ulcer [name], [name] ([id number]) [phone].pdf Page 4 of 10 Nursing Diagnoses: Knowledge deficit related to disease process and management Potential for venous Insuffiency  (use before diagnosis confirmed) Goals: Patient will maintain optimal edema control Date Initiated: 07/27/2021 Target Resolution Date: 02/23/2023 Goal Status: Active Interventions: Assess peripheral edema status every visit. Treatment Activities: Therapeutic compression applied : 07/27/2021 Notes: Wound/Skin Impairment Nursing Diagnoses: Impaired tissue integrity Knowledge deficit related to ulceration/compromised skin integrity Goals: Patient will have a decrease in wound volume by X% from date: (specify in notes) Date Initiated: 04/12/2021 Date Inactivated: 01/04/2022 Target Resolution Date: 04/23/2021 Goal Status: Met Patient/caregiver will verbalize understanding of skin care regimen Date Initiated: 01/04/2022 Target Resolution Date: 02/23/2023 Goal Status: Active Ulcer/skin breakdown will have a volume reduction of 30% by week 4 Date Initiated: 04/12/2021 Date Inactivated: 04/27/2021 Target Resolution Date: 04/27/2021 Goal Status: Unmet Unmet Reason: infection Ulcer/skin breakdown will have a volume reduction of 50% by week 8 Date Initiated: 04/27/2021 Date Inactivated: 06/29/2021 Target Resolution Date: 06/24/2021 Goal Status: Met Interventions: Assess patient/caregiver ability to obtain necessary supplies Assess patient/caregiver ability to perform ulcer/skin care regimen upon admission and as needed Assess ulceration(s) every visit Notes: Electronic Signature(s) Signed: 08/09/2022 5:14:21 PM By: Blanche East RN Entered By: Blanche East on 08/09/2022 10:01:41 --------------------------------------------------------------------------------  Pain Assessment Details Patient Name: Date of Service: [name], [name] 08/09/2022 10:00 A M Medical Record Number: [id number] Patient Account Number: [id number] Date of Birth/Sex: Treating RN: 02/27/1951 (72 y.o. [name] Primary Care Julienne Vogler: [name] Other Clinician: Referring Nicolaos Mitrano: Treating Donnalee Cellucci/Extender: [name] in Treatment: 43 Active Problems Location of Pain Severity and Description of Pain Patient Has Paino No Site Locations Rate the pain. HALIL, SLIMP ([id number]) [id number] Page 5 of 10 Rate the pain. Current Pain Level: 0 Pain Management and Medication Current Pain Management: Electronic Signature(s) Signed: 08/09/2022 5:14:21 PM By: Blanche East RN Entered By: Blanche East on 08/09/2022 10:00:49 -------------------------------------------------------------------------------- Patient/Caregiver Education Details Patient Name: Date of Service: [name], [name] 2/15/2024andnbsp10:00 [place] Record Number: [id number] Patient Account Number: [id number] Date of Birth/Gender: Treating RN: 1951-01-08 (72 y.o. [name] Primary Care Physician: [name] Other Clinician: Referring Physician: Treating Physician/Extender: [name] in Treatment: 51 Education Assessment Education Provided To: Patient Education Topics Provided Wound Debridement: Methods: Explain/Verbal Responses: Reinforcements needed, State content correctly Electronic Signature(s) Signed: 08/09/2022 5:14:21 PM By: Blanche East RN Entered By: Blanche East on 08/09/2022 10:01:57 -------------------------------------------------------------------------------- Wound Assessment Details Patient Name: Date of Service: [name]. 08/09/2022 10:00 A M Medical Record Number: [id number] Patient Account Number: [id number] Date of Birth/Sex: Treating RN: 1950-08-15 (72 y.o. [name] Primary Care Rondel Episcopo: [name] Other Clinician: Referring Audrianna Driskill: Treating Adalynne Steffensmeier/Extender: [name], [name], [name] ([id number]) [id number] Page 6 of 10 Weeks in Treatment: 69 Wound Status Wound Number: 18R Primary Diabetic Wound/Ulcer of the Lower Extremity Etiology: Wound Location:  Left, Plantar Metatarsal head first Wound Open Wounding Event: Gradually Appeared Status: Date Acquired: 08/23/2020 Comorbid Glaucoma, Sleep Apnea, Hypertension, Peripheral Arterial Disease, Weeks Of Treatment: 69 History: Peripheral Venous Disease, Type II Diabetes, Gout, Osteoarthritis, Clustered Wound: No Neuropathy Photos Wound Measurements Length: (cm) 0.1 Width: (cm) 0.1 Depth: (cm) 0.1 Area: (cm) 0.008 Volume: (cm) 0.001 % Reduction in Area: 99.9% % Reduction in Volume: 100% Epithelialization: Small (1-33%) Tunneling: No Undermining: No Wound Description Classification: Grade 2 Wound Margin: Flat and Intact Exudate Amount: Medium Exudate Type: Serosanguineous Exudate Color: red, brown Foul Odor After Cleansing: No Slough/Fibrino Yes Wound Bed Granulation Amount: Small (1-33%) Exposed Structure Granulation Quality: Red Fascia Exposed: No Necrotic Amount: Large (67-100%) Fat Layer (Subcutaneous Tissue) Exposed: Yes Necrotic Quality: Adherent Slough Tendon Exposed: No Muscle Exposed: No Joint Exposed: No Bone Exposed: No Periwound Skin Texture Texture Color No Abnormalities Noted: No No Abnormalities Noted: Yes Callus: Yes Temperature / Pain Temperature: No Abnormality Moisture No Abnormalities Noted: No Dry / Scaly: Yes Maceration: No Treatment Notes Wound #18R (Metatarsal head first) Wound Laterality: Plantar, Left Cleanser Soap and Water Discharge Instruction: May shower and wash wound with dial antibacterial soap and water prior to dressing change. Wound Cleanser Discharge Instruction: Cleanse the wound with wound cleanser prior to applying a clean dressing using gauze sponges, not tissue or cotton balls. Peri-Wound Care Topical Primary Dressing Sorbalgon AG Dressing 2x2 (in/in) Discharge Instruction: Apply to wound bed as instructed [name], [name] ([id number]) [id number] Page 7 of 10 Secondary Dressing Zetuvit Plus  Silicone Border Dressing 4x4 (in/in) Discharge Instruction: Apply silicone border over primary dressing as directed. Secured With Compression Wrap Compression Stockings Environmental education officer) Signed: 08/09/2022 5:14:21 PM By: Blanche East RN Entered By: Blanche East on 08/09/2022 10:23:00 -------------------------------------------------------------------------------- Wound Assessment Details Patient Name: Date of Service: [name]. 08/09/2022 10:00  A M Medical Record Number: [id number] Patient Account Number: [id number] Date of Birth/Sex: Treating RN: March 15, 1951 (72 y.o. [name] Primary Care Tyrease Vandeberg: [name] Other Clinician: Referring Adline Kirshenbaum: Treating Fatmata Legere/Extender: [name] in Treatment: 69 Wound Status Wound Number: 22 Primary Cyst Etiology: Wound Location: Left, Proximal, Lateral Lower Leg Wound Open Wounding Event: Bump Status: Date Acquired: 06/03/2021 Comorbid Glaucoma, Sleep Apnea, Hypertension, Peripheral Arterial Disease, Weeks Of Treatment: 61 History: Peripheral Venous Disease, Type II Diabetes, Gout, Osteoarthritis, Clustered Wound: Yes Neuropathy Photos Wound Measurements Length: (cm) Width: (cm) Depth: (cm) Clustered Quantity: Area: (cm) Volume: (cm) 2.2 % Reduction in Area: 16.2% 0.8 % Reduction in Volume: 89.5% 0.1 Epithelialization: Medium (34-66%) 3 Tunneling: No 1.382 Undermining: No 0.138 Wound Description Classification: Full Thickness With Exposed Suppo Wound Margin: Fibrotic scar, thickened scar Exudate Amount: Medium Exudate Type: Serosanguineous Exudate Color: red, brown rt Structures Foul Odor After Cleansing: No Slough/Fibrino Yes Wound Bed Granulation Amount: Large (67-100%) Exposed Structure Granulation Quality: Red, Pink Fascia Exposed: No [name] ([id number] Page 8 of 10 Necrotic Amount: Small (1-33%) Fat Layer  (Subcutaneous Tissue) Exposed: Yes Necrotic Quality: Eschar, Adherent Slough Tendon Exposed: No Muscle Exposed: No Joint Exposed: No Bone Exposed: No Periwound Skin Texture Texture Color No Abnormalities Noted: No No Abnormalities Noted: No Scarring: Yes Hemosiderin Staining: Yes Moisture Temperature / Pain No Abnormalities Noted: No Temperature: No Abnormality Dry / Scaly: Yes Tenderness on Palpation: Yes Treatment Notes Wound #22 (Lower Leg) Wound Laterality: Left, Lateral, Proximal Cleanser Soap and Water Discharge Instruction: May shower and wash wound with dial antibacterial soap and water prior to dressing change. Wound Cleanser Discharge Instruction: Cleanse the wound with wound cleanser prior to applying a clean dressing using gauze sponges, not tissue or cotton balls. Peri-Wound Care Sween Lotion (Moisturizing lotion) Discharge Instruction: Apply moisturizing lotion to the leg Topical Skintegrity Hydrogel 4 (oz) Discharge Instruction: Apply hydrogel as directed Primary Dressing Endoform 2x2 in Discharge Instruction: Moisten with Hydrogel or saline Secondary Dressing ABD Pad, 8x10 Discharge Instruction: Apply over primary dressing as directed. Secured With Elastic Bandage 4 inch (ACE bandage) Discharge Instruction: Secure with ACE bandage as directed. Compression Wrap Compression Stockings Add-Ons Electronic Signature(s) Signed: 08/09/2022 5:14:21 PM By: Blanche East RN Entered By: Blanche East on 08/09/2022 10:24:02 -------------------------------------------------------------------------------- Wound Assessment Details Patient Name: Date of Service: [name]. 08/09/2022 10:00 A M Medical Record Number: [id number] Patient Account Number: [id number] Date of Birth/Sex: Treating RN: 07-Apr-1951 (72 y.o. [name] Primary Care Kateland Leisinger: [name] Other Clinician: Referring Zaden Sako: Treating Allicia Culley/Extender: [name] in Treatment: 69 Wound Status Wound Number: 30 Primary Abrasion Etiology: Wound Location: Left, Anterior Lower Leg [name], [name] ([id number]) [id number] Page 9 of 10 Wound Open Wounding Event: Shear/Friction Status: Date Acquired: 06/21/2022 Comorbid Glaucoma, Sleep Apnea, Hypertension, Peripheral Arterial Disease, Weeks Of Treatment: 7 History: Peripheral Venous Disease, Type II Diabetes, Gout, Osteoarthritis, Clustered Wound: No Neuropathy Photos Wound Measurements Length: (cm) 0.1 Width: (cm) 0.1 Depth: (cm) 0.1 Area: (cm) 0.008 Volume: (cm) 0.001 % Reduction in Area: 98% % Reduction in Volume: 97.4% Epithelialization: Large (67-100%) Tunneling: No Undermining: No Wound Description Classification: Full Thickness Without Exposed Support Structures Wound Margin: Distinct, outline attached Exudate Amount: Medium Exudate Type: Serous Exudate Color: amber Foul Odor After Cleansing: No Slough/Fibrino Yes Wound Bed Granulation Amount: Medium (34-66%) Exposed Structure Granulation Quality: Red Fascia Exposed: No Necrotic Amount: Medium (34-66%) Fat Layer (Subcutaneous Tissue) Exposed: Yes Necrotic Quality: Adherent Slough Tendon  Exposed: No Muscle Exposed: No Joint Exposed: No Bone Exposed: No Periwound Skin Texture Texture Color No Abnormalities Noted: Yes No Abnormalities Noted: No Hemosiderin Staining: Yes Moisture No Abnormalities Noted: No Temperature / Pain Maceration: Yes Temperature: No Abnormality Treatment Notes Wound #30 (Lower Leg) Wound Laterality: Left, Anterior Cleanser Soap and Water Discharge Instruction: May shower and wash wound with dial antibacterial soap and water prior to dressing change. Wound Cleanser Discharge Instruction: Cleanse the wound with wound cleanser prior to applying a clean dressing using gauze sponges, not tissue or cotton balls. Peri-Wound Care Topical Primary Dressing Sorbalgon  AG Dressing 2x2 (in/in) Discharge Instruction: Apply to wound bed as instructed Secondary Dressing Zetuvit Plus Silicone Border Dressing 4x4 (in/in) Discharge Instruction: Apply silicone border over primary dressing as directed. Secured With [name] ([id number]) [phone].pdf Page 10 of 10 Compression Wrap CoFlex TLC Zinc Compression Stockings Add-Ons Electronic Signature(s) Signed: 08/09/2022 5:14:21 PM By: Blanche East RN Entered By: Blanche East on 08/09/2022 10:24:33 -------------------------------------------------------------------------------- Vitals Details Patient Name: Date of Service: [name]. 08/09/2022 10:00 A M Medical Record Number: [id number] Patient Account Number: [id number] Date of Birth/Sex: Treating RN: [birth year] (72 y.o. [name] Primary Care Weda Baumgarner: [name] Other Clinician: Referring Harla Mensch: Treating Whitten Andreoni/Extender: [name] in Treatment: 2 Vital Signs Time Taken: 10:00 Reference Range: 80 - 120 mg / dl Height (in): 74 Weight (lbs): 238 Body Mass Index (BMI): 30.6 Electronic Signature(s) Signed: 08/09/2022 5:14:21 PM By: Blanche East RN Entered By: Blanche East on 08/09/2022 10:00:41

## 2022-08-16 ENCOUNTER — Encounter (HOSPITAL_BASED_OUTPATIENT_CLINIC_OR_DEPARTMENT_OTHER): Payer: Medicare Other | Admitting: General Surgery

## 2022-08-16 DIAGNOSIS — E11621 Type 2 diabetes mellitus with foot ulcer: Secondary | ICD-10-CM | POA: Diagnosis not present

## 2022-08-17 NOTE — Progress Notes (Signed)
RENTON, HANDRICK ([id number]) [id number] Page 1 of 11 Visit Report for 08/16/2022 Arrival Information Details Patient Name: Date of Service: [name], LETTER 08/16/2022 10:45 A M Medical Record Number: [id number] Patient Account Number: [id number] Date of Birth/Sex: Treating RN: 1950/10/27 (72 y.o. [name] Primary Care Tomy Khim: [name] Other Clinician: Referring Delbra Zellars: Treating Truman Aceituno/Extender: [name] in Treatment: 23 Visit Information History Since Last Visit Added or deleted any medications: No Patient Arrived: Ambulatory Any new allergies or adverse reactions: No Arrival Time: 10:54 Had a fall or experienced change in No Accompanied By: self activities of daily living that may affect Transfer Assistance: None risk of falls: Patient Identification Verified: Yes Signs or symptoms of abuse/neglect since last visito No Secondary Verification Process Completed: Yes Hospitalized since last visit: No Patient Requires Transmission-Based Precautions: No Implantable device outside of the clinic excluding No Patient Has Alerts: Yes cellular tissue based products placed in the center since last visit: Has Dressing in Place as Prescribed: Yes Pain Present Now: No Electronic Signature(s) Signed: 08/16/2022 4:46:19 PM By: Blanche East RN Entered By: Blanche East on 08/16/2022 10:55:19 -------------------------------------------------------------------------------- Clinic Level of Care Assessment Details Patient Name: Date of Service: [name], PELFREY 08/16/2022 10:45 A M Medical Record Number: [id number] Patient Account Number: [id number] Date of Birth/Sex: Treating RN: 1950-07-14 (72 y.o. [name] Primary Care Kallen Delatorre: [name] Other Clinician: Referring Charlyn Vialpando: Treating Mikayah Joy/Extender: [name] in Treatment: 29 Clinic Level of Care Assessment Items TOOL 1 Quantity  Score '[]'$  - 0 Use when EandM and Procedure is performed on INITIAL visit ASSESSMENTS - Nursing Assessment / Reassessment '[]'$  - 0 General Physical Exam (combine w/ comprehensive assessment (listed just below) when performed on new pt. evals) '[]'$  - 0 Comprehensive Assessment (HX, ROS, Risk Assessments, Wounds Hx, etc.) ASSESSMENTS - Wound and Skin Assessment / Reassessment '[]'$  - 0 Dermatologic / Skin Assessment (not related to wound area) ASSESSMENTS - Ostomy and/or Continence Assessment and Care '[]'$  - 0 Incontinence Assessment and Management '[]'$  - 0 Ostomy Care Assessment and Management (repouching, etc.) PROCESS - Coordination of Care '[]'$  - 0 Simple Patient / Family Education for ongoing care '[]'$  - 0 Complex (extensive) Patient / Family Education for ongoing care DAWUAN, BENDOLPH ([id number]) [phone].pdf Page 2 of 11 '[]'$  - 0 Staff obtains Consents, Records, T Results / Process Orders est '[]'$  - 0 Staff telephones HHA, Nursing Homes / Clarify orders / etc '[]'$  - 0 Routine Transfer to another Facility (non-emergent condition) '[]'$  - 0 Routine Hospital Admission (non-emergent condition) '[]'$  - 0 New Admissions / Biomedical engineer / Ordering NPWT Apligraf, etc. , '[]'$  - 0 Emergency Hospital Admission (emergent condition) PROCESS - Special Needs '[]'$  - 0 Pediatric / Minor Patient Management '[]'$  - 0 Isolation Patient Management '[]'$  - 0 Hearing / Language / Visual special needs '[]'$  - 0 Assessment of Community assistance (transportation, D/C planning, etc.) '[]'$  - 0 Additional assistance / Altered mentation '[]'$  - 0 Support Surface(s) Assessment (bed, cushion, seat, etc.) INTERVENTIONS - Miscellaneous '[]'$  - 0 External ear exam '[]'$  - 0 Patient Transfer (multiple staff / Civil Service fast streamer / Similar devices) '[]'$  - 0 Simple Staple / Suture removal (25 or less) '[]'$  - 0 Complex Staple / Suture removal (26 or more) '[]'$  - 0 Hypo/Hyperglycemic Management (do not check if billed  separately) '[]'$  - 0 Ankle / Brachial Index (ABI) - do not check if billed separately Has the patient been seen at the hospital within the  last three years: Yes Total Score: 0 Level Of Care: ____ Electronic Signature(s) Signed: 08/16/2022 4:46:19 PM By: Blanche East RN Entered By: Blanche East on 08/16/2022 11:19:52 -------------------------------------------------------------------------------- Compression Therapy Details Patient Name: Date of Service: [name]. 08/16/2022 10:45 A M Medical Record Number: [id number] Patient Account Number: [id number] Date of Birth/Sex: Treating RN: Aug 10, 1950 (72 y.o. [name] Primary Care Gerrianne Aydelott: [name] Other Clinician: Referring Ekta Dancer: Treating Tylar Amborn/Extender: [name] in Treatment: 39 Compression Therapy Performed for Wound Assessment: Wound #18R Left,Plantar Metatarsal head first Performed By: Clinician Blanche East, RN Compression Type: Double Layer Post Procedure Diagnosis Same as Pre-procedure Electronic Signature(s) Signed: 08/16/2022 4:46:19 PM By: Blanche East RN Entered By: Blanche East on 08/16/2022 11:22:14 [name] ([id number] Page 3 of 11 -------------------------------------------------------------------------------- Encounter Discharge Information Details Patient Name: Date of Service: [name], [name] 08/16/2022 10:45 A M Medical Record Number: [id number] Patient Account Number: [id number] Date of Birth/Sex: Treating RN: 1950/08/10 (72 y.o. [name] Primary Care Tayjah Lobdell: [name] Other Clinician: Referring Maryanna Stuber: Treating Yordi Krager/Extender: [name] in Treatment: 36 Encounter Discharge Information Items Post Procedure Vitals Discharge Condition: Stable Temperature (F): 98.0 Ambulatory Status: Ambulatory Pulse (bpm): 69 Discharge Destination: Home Respiratory Rate (breaths/min):  16 Transportation: Private Auto Blood Pressure (mmHg): 148/86 Accompanied By: self Schedule Follow-up Appointment: Yes Clinical Summary of Care: Electronic Signature(s) Signed: 08/16/2022 12:49:48 PM By: Blanche East RN Previous Signature: 08/16/2022 12:18:14 PM Version By: Blanche East RN Entered By: Blanche East on 08/16/2022 12:49:48 -------------------------------------------------------------------------------- Lower Extremity Assessment Details Patient Name: Date of Service: [name]. 08/16/2022 10:45 A M Medical Record Number: [id number] Patient Account Number: [id number] Date of Birth/Sex: Treating RN: September 30, 1950 (72 y.o. [name] Primary Care Artemus Romanoff: [name] Other Clinician: Referring Kieanna Rollo: Treating Dameshia Seybold/Extender: [name] in Treatment: 70 Edema Assessment Assessed: [Left: No] [Right: No] Edema: [Left: Ye] [Right: s] Calf Left: Right: Point of Measurement: 41 cm From Medial Instep 49 cm Ankle Left: Right: Point of Measurement: 10 cm From Medial Instep 31.8 cm Electronic Signature(s) Signed: 08/16/2022 4:46:19 PM By: Blanche East RN Entered By: Blanche East on 08/16/2022 10:58:26 Multi Wound Chart Details -------------------------------------------------------------------------------- [name] ([id number] Page 4 of 11 Patient Name: Date of Service: CHISTIAN, BURRUEL 08/16/2022 10:45 A M Medical Record Number: [id number] Patient Account Number: [id number] Date of Birth/Sex: Treating RN: February 04, 1951 (72 y.o. 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 Primary Etiology: Extremity Glaucoma, Sleep Apnea, Glaucoma, Sleep Apnea, Glaucoma, Sleep Apnea, Comorbid History: Hypertension, Peripheral Arterial Hypertension, Peripheral Arterial Hypertension, Peripheral Arterial Disease, Peripheral Venous Disease, Disease, Peripheral Venous Disease, Disease, Peripheral Venous Disease, Type II Diabetes, Gout, Osteoarthritis, Type II Diabetes, Gout, Osteoarthritis, Type II Diabetes, Gout, Osteoarthritis, Neuropathy Neuropathy Neuropathy 08/23/2020 06/03/2021 06/21/2022 Date Acquired: 70 62 8 Weeks of Treatment: Open Open Open Wound Status: Yes No No Wound Recurrence: No Yes No Clustered Wound: N/A 3 N/A Clustered Quantity: 0.1x0.1x0.1 1.6x0.3x0.1 0.1x0.1x0.1 Measurements L x W x D (cm) 0.008 0.377 0.008 A (cm) : rea 0.001 0.038  0.001 Volume (cm) : 99.90% 77.10% 98.00% % Reduction in A rea: 100.00% 97.10% 97.40% % Reduction in Volume: Grade 2 Full Thickness With Exposed Support Full Thickness Without Exposed Classification: Structures Support Structures Medium Medium Medium Exudate A mount: Serosanguineous Serosanguineous Serous Exudate Type: red, brown red, brown amber Exudate Color: Flat and Intact Fibrotic scar, thickened scar Distinct, outline attached Wound Margin: Small (1-33%) Large (67-100%) Medium (34-66%) Granulation A mount: Red Red, Pink Red Granulation Quality: Large (67-100%) Small (1-33%) Medium (34-66%) Necrotic A mount: Adherent Slough Eschar, Adherent Slough Adherent Slough Necrotic Tissue: Fat Layer (Subcutaneous Tissue): Yes Fat Layer (Subcutaneous Tissue): Yes Fat Layer (Subcutaneous Tissue): Yes Exposed Structures: Fascia: No Fascia: No Fascia: No Tendon: No Tendon:  No Tendon: No Muscle: No Muscle: No Muscle: No Joint: No Joint: No Joint: No Bone: No Bone: No Bone: No Small (1-33%) Small (1-33%) Large (67-100%) Epithelialization: N/A Debridement - Selective/Open Wound N/A Debridement: Pre-procedure Verification/Time Out N/A 11:19 N/A Taken: N/A Slough N/A Tissue Debrided: N/A Non-Viable Tissue N/A Level: N/A 0.48 N/A Debridement A (sq cm): rea N/A Curette N/A Instrument: N/A Minimum N/A Bleeding: N/A Pressure N/A Hemostasis A chieved: N/A Procedure was tolerated well N/A Debridement Treatment Response: N/A 1.6x0.3x0.1 N/A Post Debridement Measurements L x W x D (cm) N/A 0.038 N/A Post Debridement Volume: (cm) Callus: Yes Scarring: Yes No Abnormalities Noted Periwound Skin Texture: Dry/Scaly: Yes Dry/Scaly: Yes Maceration: Yes Periwound Skin Moisture: Maceration: No No Abnormalities Noted Hemosiderin Staining: Yes Hemosiderin Staining: Yes Periwound Skin Color: No Abnormality No Abnormality No Abnormality Temperature: N/A Yes N/A Tenderness on Palpation: Compression Therapy Debridement N/A Procedures Performed: [name] ([id number]) [id number] Page 5 of 11 Treatment Notes Electronic Signature(s) Signed: 08/16/2022 11:26:07 AM By: Fredirick Maudlin MD FACS Entered By: Fredirick Maudlin on 08/16/2022 11:26:07 -------------------------------------------------------------------------------- Multi-Disciplinary Care Plan Details Patient Name: Date of Service: [name]. 08/16/2022 10:45 A M Medical Record Number: [id number] Patient Account Number: [id number] Date of Birth/Sex: Treating RN: [birth year] (72 y.o. [name] Primary Care Tenesia Escudero: [name] Other Clinician: Referring Dollie Mayse: Treating Mansur Patti/Extender: [name] in Treatment: 44 Multidisciplinary Care Plan reviewed with physician Active Inactive Venous Leg Ulcer Nursing  Diagnoses: Knowledge deficit related to disease process and management Potential for venous Insuffiency (use before diagnosis confirmed) Goals: Patient will maintain optimal edema control Date Initiated: 07/27/2021 Target Resolution Date: 02/23/2023 Goal Status: Active Interventions: Assess peripheral edema status every visit. Treatment Activities: Therapeutic compression applied : 07/27/2021 Notes: Wound/Skin Impairment Nursing Diagnoses: Impaired tissue integrity Knowledge deficit related to ulceration/compromised skin integrity Goals: Patient will have a decrease in wound volume by X% from date: (specify in notes) Date Initiated: 04/12/2021 Date Inactivated: 01/04/2022 Target Resolution Date: 04/23/2021 Goal Status: Met Patient/caregiver will verbalize understanding of skin care regimen Date Initiated: 01/04/2022 Target Resolution Date: 02/23/2023 Goal Status: Active Ulcer/skin breakdown will have a volume reduction of 30% by week 4 Date Initiated: 04/12/2021 Date Inactivated: 04/27/2021 Target Resolution Date: 04/27/2021 Goal Status: Unmet Unmet Reason: infection Ulcer/skin breakdown will have a volume reduction of 50% by week 8 Date Initiated: 04/27/2021 Date Inactivated: 06/29/2021 Target Resolution Date: 06/24/2021 Goal Status: Met Interventions: Assess patient/caregiver ability to obtain necessary supplies Assess patient/caregiver ability to perform ulcer/skin care regimen upon admission and as needed Assess ulceration(s) every visit Notes: LUCAS, KILBARGER ([id number]) [phone].pdf Page 6 of 11 Electronic Signature(s) Signed: 08/16/2022 4:46:19 PM By: Blanche East RN Entered By: Blanche East on 08/16/2022 11:18:42 -------------------------------------------------------------------------------- Pain Assessment Details Patient Name: Date of Service:  [name], PIER 08/16/2022 10:45 A M Medical Record Number: [id number] Patient Account Number:  [id number] Date of Birth/Sex: Treating RN: 02-28-1951 (72 y.o. [name] Primary Care Donavin Audino: [name] Other Clinician: Referring Srinika Delone: Treating Corrie Brannen/Extender: [name] in Treatment: 12 Active Problems Location of Pain Severity and Description of Pain Patient Has Paino No Site Locations Rate the pain. Current Pain Level: 0 Pain Management and Medication Current Pain Management: Electronic Signature(s) Signed: 08/16/2022 4:46:19 PM By: Blanche East RN Entered By: Blanche East on 08/16/2022 10:58:17 -------------------------------------------------------------------------------- Patient/Caregiver Education Details Patient Name: Date of Service: Busic, [name] 2/22/2024andnbsp10:45 [place] Record Number: [id number] Patient Account Number: [id number] Date of Birth/Gender: Treating RN: 12/18/1950 (72 y.o. [name] Primary Care Physician: [name] Other Clinician: Referring Physician: Treating Physician/Extender: [name] in Treatment: 69 Education Assessment Education Provided To: Patient BRIJ, VANVACTOR ([id number]) [id number] Page 7 of 11 Education Topics Provided Wound Debridement: Methods: Explain/Verbal Responses: Refused information, State content correctly Wound/Skin Impairment: Methods: Explain/Verbal Responses: Reinforcements needed, State content correctly Electronic Signature(s) Signed: 08/16/2022 4:46:19 PM By: Blanche East RN Entered By: Blanche East on 08/16/2022 11:19:09 -------------------------------------------------------------------------------- Wound Assessment Details Patient Name: Date of Service: [name]. 08/16/2022 10:45 A M Medical Record Number: [id number] Patient Account Number: [id number] Date of Birth/Sex: Treating RN: [birth year] (72 y.o. [name] Primary Care Lucca Ballo: [name] Other Clinician: Referring  Zeshan Sena: Treating Autum Benfer/Extender: [name] in Treatment: 57 Wound Status Wound Number: 18R Primary Diabetic Wound/Ulcer of the Lower Extremity Etiology: Wound Location: Left, Plantar Metatarsal head first Wound Open Wounding Event: Gradually Appeared Status: Date Acquired: 08/23/2020 Comorbid Glaucoma, Sleep Apnea, Hypertension, Peripheral Arterial Disease, Weeks Of Treatment: 70 History: Peripheral Venous Disease, Type II Diabetes, Gout, Osteoarthritis, Clustered Wound: No Neuropathy Photos Wound Measurements Length: (cm) 0.1 Width: (cm) 0.1 Depth: (cm) 0.1 Area: (cm) 0.008 Volume: (cm) 0.001 % Reduction in Area: 99.9% % Reduction in Volume: 100% Epithelialization: Small (1-33%) Tunneling: No Undermining: No Wound Description Classification: Grade 2 Wound Margin: Flat and Intact Exudate Amount: Medium Exudate Type: Serosanguineous Exudate Color: red, brown Foul Odor After Cleansing: No Slough/Fibrino Yes Wound Bed Granulation Amount: Small (1-33%) Exposed Structure Granulation Quality: Red Fascia Exposed: No Necrotic Amount: Large (67-100%) Fat Layer (Subcutaneous Tissue) Exposed: Yes [name] ([id number] Page 8 of 11 Necrotic Quality: Adherent Slough Tendon Exposed: No Muscle Exposed: No Joint Exposed: No Bone Exposed: No Periwound Skin Texture Texture Color No Abnormalities Noted: No No Abnormalities Noted: Yes Callus: Yes Temperature / Pain Temperature: No Abnormality Moisture No Abnormalities Noted: No Dry / Scaly: Yes Maceration: No Treatment Notes Wound #18R (Metatarsal head first) Wound Laterality: Plantar, Left Cleanser Soap and Water Discharge Instruction: May shower and wash wound with dial antibacterial soap and water prior to dressing change. Wound Cleanser Discharge Instruction: Cleanse the wound with wound cleanser prior to applying a clean dressing using gauze  sponges, not tissue or cotton balls. Peri-Wound Care Topical Primary Dressing Sorbalgon AG Dressing 2x2 (in/in) Discharge Instruction: Apply to wound bed as instructed Secondary Dressing ALLEVYN Heel 4 1/2in x 5 1/2in / 10.5cm x 13.5cm Discharge Instruction: Apply over primary dressing as directed. Optifoam Non-Adhesive Dressing, 4x4 in Discharge Instruction: Apply over primary dressing as directed. Zetuvit Plus Silicone Border Dressing 4x4 (in/in) Discharge Instruction: Apply silicone border over primary dressing as directed. Secured With Compression Wrap Compression Stockings Environmental education officer) Signed: 08/16/2022 4:46:19 PM By: Blanche East RN  Entered By: Blanche East on 08/16/2022 11:10:09 -------------------------------------------------------------------------------- Wound Assessment Details Patient Name: Date of Service: NYCHOLAS, PEOT 08/16/2022 10:45 A M Medical Record Number: [id number] Patient Account Number: [id number] Date of Birth/Sex: Treating RN: [birth year] (72 y.o. [name] Primary Care Rosco Harriott: [name] Other Clinician: Referring Kaylor Maiers: Treating Jakwan Sally/Extender: [name] in Treatment: 72 Wound Status Wound Number: 22 Primary Cyst Etiology: Wound Location: Left, Proximal, Lateral Lower Leg Wound Open Wounding Event: Bump Status: Date Acquired: 06/03/2021 Comorbid Glaucoma, Sleep Apnea, Hypertension, Peripheral Arterial Disease, Weeks Of Treatment: [address] ([id number]) [id number] Page 9 of 11 Weeks Of Treatment: 62 History: Peripheral Venous Disease, Type II Diabetes, Gout, Osteoarthritis, Clustered Wound: Yes Neuropathy Photos Wound Measurements Length: (cm) Width: (cm) Depth: (cm) Clustered Quantity: Area: (cm) Volume: (cm) 1.6 % Reduction in Area: 77.1% 0.3 % Reduction in Volume: 97.1% 0.1 Epithelialization: Small (1-33%) 3 Tunneling: No 0.377  Undermining: No 0.038 Wound Description Classification: Full Thickness With Exposed Support Structures Wound Margin: Fibrotic scar, thickened scar Exudate Amount: Medium Exudate Type: Serosanguineous Exudate Color: red, brown Foul Odor After Cleansing: No Slough/Fibrino Yes Wound Bed Granulation Amount: Large (67-100%) Exposed Structure Granulation Quality: Red, Pink Fascia Exposed: No Necrotic Amount: Small (1-33%) Fat Layer (Subcutaneous Tissue) Exposed: Yes Necrotic Quality: Eschar, Adherent Slough Tendon Exposed: No Muscle Exposed: No Joint Exposed: No Bone Exposed: No Periwound Skin Texture Texture Color No Abnormalities Noted: No No Abnormalities Noted: No Scarring: Yes Hemosiderin Staining: Yes Moisture Temperature / Pain No Abnormalities Noted: No Temperature: No Abnormality Dry / Scaly: Yes Tenderness on Palpation: Yes Treatment Notes Wound #22 (Lower Leg) Wound Laterality: Left, Lateral, Proximal Cleanser Soap and Water Discharge Instruction: May shower and wash wound with dial antibacterial soap and water prior to dressing change. Wound Cleanser Discharge Instruction: Cleanse the wound with wound cleanser prior to applying a clean dressing using gauze sponges, not tissue or cotton balls. Peri-Wound Care Sween Lotion (Moisturizing lotion) Discharge Instruction: Apply moisturizing lotion to the leg Topical Skintegrity Hydrogel 4 (oz) Discharge Instruction: Apply hydrogel as directed Primary Dressing Endoform 2x2 in Discharge Instruction: Moisten with Hydrogel or saline Secondary Dressing ABD Pad, 8x10 ARIAM, LAMPER ([id number]) [id number] Page 10 of 11 Discharge Instruction: Apply over primary dressing as directed. Secured With Elastic Bandage 4 inch (ACE bandage) Discharge Instruction: Secure with ACE bandage as directed. Compression Wrap Compression Stockings Add-Ons Electronic Signature(s) Signed: 08/16/2022 4:46:19 PM  By: Blanche East RN Entered By: Blanche East on 08/16/2022 11:11:00 -------------------------------------------------------------------------------- Wound Assessment Details Patient Name: Date of Service: [name], [name] 08/16/2022 10:45 A M Medical Record Number: [id number] Patient Account Number: [id number] Date of Birth/Sex: Treating RN: Aug 10, 1950 (72 y.o. [name] Primary Care Vici Novick: [name] Other Clinician: Referring Riannah Stagner: Treating Savi Lastinger/Extender: [name] in Treatment: 84 Wound Status Wound Number: 30 Primary Abrasion Etiology: Wound Location: Left, Anterior Lower Leg Wound Open Wounding Event: Shear/Friction Status: Date Acquired: 06/21/2022 Comorbid Glaucoma, Sleep Apnea, Hypertension, Peripheral Arterial Disease, Weeks Of Treatment: 8 History: Peripheral Venous Disease, Type II Diabetes, Gout, Osteoarthritis, Clustered Wound: No Neuropathy Photos Wound Measurements Length: (cm) 0.1 Width: (cm) 0.1 Depth: (cm) 0.1 Area: (cm) 0.008 Volume: (cm) 0.001 % Reduction in Area: 98% % Reduction in Volume: 97.4% Epithelialization: Large (67-100%) Tunneling: No Undermining: No Wound Description Classification: Full Thickness Without Exposed Support Structures Wound Margin: Distinct, outline attached Exudate Amount: Medium Exudate Type: Serous Exudate Color: amber Foul Odor After Cleansing: No Slough/Fibrino Yes Wound Bed Granulation Amount:  Medium (34-66%) Exposed Structure Granulation Quality: Red Fascia Exposed: No Necrotic Amount: Medium (34-66%) Fat Layer (Subcutaneous Tissue) Exposed: Yes Necrotic Quality: Adherent Slough Tendon Exposed: No DESHUN, URLACHER ([id number] Page 11 of 11 Muscle Exposed: No Joint Exposed: No Bone Exposed: No Periwound Skin Texture Texture Color No Abnormalities Noted: Yes No Abnormalities Noted: No Hemosiderin Staining: Yes Moisture No  Abnormalities Noted: No Temperature / Pain Maceration: Yes Temperature: No Abnormality Treatment Notes Wound #30 (Lower Leg) Wound Laterality: Left, Anterior Cleanser Soap and Water Discharge Instruction: May shower and wash wound with dial antibacterial soap and water prior to dressing change. Wound Cleanser Discharge Instruction: Cleanse the wound with wound cleanser prior to applying a clean dressing using gauze sponges, not tissue or cotton balls. Peri-Wound Care Topical Primary Dressing Sorbalgon AG Dressing 2x2 (in/in) Discharge Instruction: Apply to wound bed as instructed Secondary Dressing Zetuvit Plus Silicone Border Dressing 4x4 (in/in) Discharge Instruction: Apply silicone border over primary dressing as directed. Secured With Compression Wrap CoFlex TLC Zinc Compression Stockings Add-Ons Electronic Signature(s) Signed: 08/16/2022 4:46:19 PM By: Blanche East RN Entered By: Blanche East on 08/16/2022 11:11:24 -------------------------------------------------------------------------------- Vitals Details Patient Name: Date of Service: [name]. 08/16/2022 10:45 A M Medical Record Number: [id number] Patient Account Number: [id number] Date of Birth/Sex: Treating RN: 12/26/1950 (72 y.o. [name] Primary Care Jaydis Duchene: [name] Other Clinician: Referring Rhylan Kagel: Treating Okema Rollinson/Extender: [name] in Treatment: 8 Vital Signs Time Taken: 10:55 Temperature (F): 98.0 Height (in): 74 Pulse (bpm): 69 Weight (lbs): 238 Respiratory Rate (breaths/min): 16 Body Mass Index (BMI): 30.6 Blood Pressure (mmHg): 148/86 Reference Range: 80 - 120 mg / dl Electronic Signature(s) Signed: 08/16/2022 4:46:19 PM By: Blanche East RN Entered By: Blanche East on 08/16/2022 10:57:44

## 2022-08-17 NOTE — Progress Notes (Signed)
GIANNO, RONDAN ([id number]) [id number] Page 1 of 19 Visit Report for 08/16/2022 Chief Complaint Document Details Patient Name: Date of Service: [name], [name] 08/16/2022 10:45 A M Medical Record Number: [id number] Patient Account Number: [id number] Date of Birth/Sex: Treating RN: 1951/02/17 (72 y.o. M) Primary Care Provider: [name] Other Clinician: Referring Provider: Treating Provider/Extender: [name] in Treatment: 60 Information Obtained from: Patient Chief Complaint Left leg and foot ulcers 04/12/2021; patient is here for wounds on his left lower leg and left plantar foot over the first metatarsal head Electronic Signature(s) Signed: 08/16/2022 11:26:17 AM By: Fredirick Maudlin MD FACS Entered By: Fredirick Maudlin on 08/16/2022 11:26:17 -------------------------------------------------------------------------------- Debridement Details Patient Name: Date of Service: [name]. 08/16/2022 10:45 A M Medical Record Number: [id number] Patient Account Number: [id number] Date of Birth/Sex: Treating RN: 09/26/1950 (72 y.o. [name] Primary Care Provider: [name] Other Clinician: Referring Provider: Treating Provider/Extender: [name] in Treatment: 70 Debridement Performed for Assessment: Wound #22 Left,Proximal,Lateral Lower Leg Performed By: Physician Fredirick Maudlin, MD Debridement Type: Debridement Level of Consciousness (Pre-procedure): Awake and Alert Pre-procedure Verification/Time Out Yes - 11:19 Taken: Start Time: 11:20 T Area Debrided (L x W): otal 1.6 (cm) x 0.3 (cm) = 0.48 (cm) Tissue and other material debrided: Non-Viable, Slough, Slough Level: Non-Viable Tissue Debridement Description: Selective/Open Wound Instrument: Curette Bleeding: Minimum Hemostasis Achieved: Pressure Response to Treatment: Procedure was tolerated well Level of Consciousness (Post-  Awake and Alert procedure): Post Debridement Measurements of Total Wound Length: (cm) 1.6 Width: (cm) 0.3 Depth: (cm) 0.1 Volume: (cm) 0.038 Character of Wound/Ulcer Post Debridement: Requires Further Debridement Post Procedure Diagnosis Same as Scharlene Gloss ([id number]) [id number] Page 2 of 19 Notes Scribed for Dr. Celine Ahr by Blanche East, RN Electronic Signature(s) Signed: 08/16/2022 11:32:37 AM By: Fredirick Maudlin MD FACS Signed: 08/16/2022 4:46:19 PM By: Blanche East RN Entered By: Blanche East on 08/16/2022 11:21:54 -------------------------------------------------------------------------------- HPI Details Patient Name: Date of Service: [name]. 08/16/2022 10:45 A M Medical Record Number: [id number] Patient Account Number: [id number] Date of Birth/Sex: Treating RN: 1950/07/16 (72 y.o. M) Primary Care Provider: [name] Other Clinician: Referring Provider: Treating Provider/Extender: [name] in Treatment: 3 History of Present Illness HPI Description: 10/11/17; Mr. Lucken is a 72 year old man who tells me that in 2015 he slipped down the latter traumatizing his left leg. He developed a wound in the same spot the area that we are currently looking at. He states this closed over for the most part although he always felt it was somewhat unstable. In 2016 he hit the same area with the door of his car had this reopened. He tells me that this is never really closed although sometimes an inflow it remains open on a constant basis. He has not been using any specific dressing to this except for topical antibiotics the nature of which were not really sure. His primary doctor did send him to see Dr. [name] of interventional cardiology. He underwent an angiogram on 08/06/17 and he underwent a PTA and directional atherectomy of the lesser distal SFA and popliteal arteries which resulted in brisk improvement in  blood flow. It was noted that he had 2 vessel runoff through the anterior tibial and peroneal. He is also been to see vascular and interventional radiologist. He was not felt to have any significant superficial venous insufficiency. Presumably is not a candidate for any ablation. It was suggested he come here for wound  care. The patient is a type II diabetic on insulin. He also has a history of venous insufficiency. ABIs on the left were noncompressible in our clinic 10/21/17; patient we admitted to the clinic last week. 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 We put Iodosorb on him after an aggressive debridement and 3 layer compression. He complained of pain in his ankle and itching with is skin in fact he scratched the area on the medial calf superiorly at the rim of our wraps and he has 2 small open areas in that location today which are new. I changed his primary dressing today to silver collagen. As noted he is already had revascularization and does not have any significant superficial venous insufficiency that would be amenable to ablation 10/28/17; patient admitted to the clinic 2 weeks ago. He has a smaller Wound. Scratch injury from last week revealed. There is large wound over the tibial area. This is smaller. Granulation looks healthy. No need for debridement. 11/04/17; the wound on the left lateral calf looks better. Improved dimensions. Surface of this looks better. We've been maintaining him and Kerlix Coban wraps. He finds this much more comfortable. Silver collagen dressing 11/11/17; left lateral Wound continues to look healthy be making progress. Using a #5 curet I removed removed nonviable skin from the surface of the wound and then necrotic debris from the wound surface. Surface of the wound continues to look healthy. He also has an open area on the left great toenail bed. We've been using topical  antibiotics. 11/19/17; left anterior lateral wound continues to look healthy but it's not closed. He also had a small wound above this on the left leg Initially traumatic wounds in the setting of significant chronic venous insufficiency and stasis dermatitis 11/25/17; left anterior wounds superiorly is closed still a small wound inferiorly. 12/02/17; left anterior tibial area. Arrives today with adherent callus. Post debridement clearly not completely closed. Hydrofera Blue under 3 layer compression. 12/09/17; left anterior tibia. Circumferential eschar however the wound bed looks stable to improved. We've been using Hydrofera Blue under 3 layer compression 12/17/17; left anterior tibia. Apparently this was felt to be closed however when the wrap was taken off there is a skin tear to reopen wounds in the same area we've been using Hydrofera Blue under 3 layer compression 12/23/17 left anterior tibia. Not close to close this week apparently the [facility] was stuck to this again. Still circumferential eschar requiring debridement. I put a contact layer on this this time under the Hydrofera Blue 12/31/17; left anterior tibia. Wound is better slight amount of hyper-granulation. Using Hydrofera Blue over Adaptic. 01/07/18; left anterior tibia. The wound had some surface eschar however after this was removed he has no open wound.he was already revascularized by Dr. [name] when he came to our clinic with atherectomy of the left SFA and popliteal artery. He was also sent to interventional radiology for venous reflux studies. He was not felt to have significant reflux but certainly has chronic venous changes of his skin with hemosiderin deposition around this area. He will definitely need to lubricate his skin and wear compression stocking and I've talked to him about this. READMISSION 05/26/2018 This is a now 72 year old man we cared for with traumatic wounds on his left anterior lower extremity. He had been  previously revascularized during that admission by Dr. [name]. Apparently in follow-up Dr. [name] noted that he had deterioration in his  arterial status. He underwent a stent placement in the distal left SFA on 04/22/2018. 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 He went back to the angiography suite on 04/30/2018 he underwent PTA and balloon angioplasty of the occluded left mid anterior tibial artery, thrombotic occlusion went from 100 to 0% which reconstitutes the posterior tibial artery. He had thrombectomy and aspiration of the peroneal artery. The stent placed in the distal SFA left SFA was still occluded. 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 He has been recuperating at [facility] after the most recent hospitalization. He is going home tomorrow. He tells me that 2 weeks ago he traumatized the tip of his left fifth toe. He came in urgently for our review of this. This was a history of before I noted that Dr. [name] had already noted dry gangrenous changes of the left fifth toe 06/09/2018; 2-week follow-up. I did contact Dr. [name] after his last appointment and he apparently saw 1 of Dr. Irven Shelling colleagues the next day. He does not TAELOR, FRAGOZA ([id number]) [id number] Page 3 of 19 follow-up with Dr. [name] himself until Thursday of this week. 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 Nevertheless there is no evidence of infection no drainage and no pain. He had a new area that this week when we were signing him in today on the left anterior mid tibia area, this is in close proximity to the previous wound we have dealt with in this clinic. 06/23/2018; 2-week  follow-up. I did not receive a recent note from Dr. [name] to review today. Our office is trying to obtain this. He is apparently not planning to do further vascular interventions and wondered about compression to try and help with the patient's chronic venous insufficiency. However we are also concerned about the arterial flow. He arrives in clinic today with a new area on the left third toe. The areas on the calf/anterior tibia are close to closing. The left fifth toe is still mummified using Betadine. -In reviewing things with the patient he has what sounds like claudication with mild to moderate amount of activity. 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 I prescribed Levaquin over the phone while we attempted to arrange a plan of care. However the patient called yesterday to report he had low-grade fever and he came in today acutely. There is been a marked deterioration in the left third toe with spreading cellulitis up into the dorsal left foot. He was referred to the emergency room. Readmission: 06/29/2020 patient presents today for reevaluation here in our clinic he was previously treated by Dr. Dellia Nims at the latter part of 2019 in 2 the beginning of 2020. Subsequently we have not seen him since that time in the interim he did have evaluation with vein and vascular specialist specifically Dr. Anice Paganini who did perform quite extensive work for a left femoral to anterior tibial artery bypass. With that being said in the interim the patient has developed significant lymphedema and has wounds that he tells me have really never healed in regard to the incision site on the left leg. He also has multiple wounds on the feet  for various reasons some of which is that he tends to pick at his feet. Fortunately there is no signs of active infection systemically at this time he does have some wounds that are little bit deeper but most are fairly superficial he seems to have  good blood flow and overall everything appears to be healthy I see no bone exposed and no obvious signs of osteomyelitis. I do not know that he necessarily needs a x-ray at this point although that something we could consider depending on how things progress. The patient does have a history of lymphedema, diabetes, this is type II, chronic kidney disease stage III, hypertension, and history of peripheral vascular disease. 07/05/2020; patient admitted last week. Is a patient I remember from 2019 he had a spreading infection involving the left foot and we sent him to the hospital. 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 He subsequently had a left femoral to anterior tibial bypass by Dr.Cain vein and vascular. He also has severe lymphedema with chronic skin changes related to that on the left leg. The most problematic area that was new today was on the left medial great toe. This was apparently a small area last week there was purulent drainage which our intake nurse cultured. Also areas on the left medial foot and heel left lateral foot. He has 2 areas on the left medial calf left lateral calf in the setting of the severe lymphedema. 07/13/2020 on evaluation today patient appears to be doing better in my opinion compared to his last visit. The good news is there is no signs of active infection systemically and locally I do not see any signs of infection either. He did have an x-ray which was negative that is great news he had a culture which showed MRSA but at the same time he is been on the doxycycline which has helped. I do think we may want to extend this for 7 additional days 1/25; patient admitted to the clinic a few weeks ago. 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 We have been putting him under compression his edema control is a lot better but he is severe verricused skin on the left leg. He is really done  quite well he still has an open area on the left medial calf and the left medial first metatarsal head. We have been using silver collagen on the leg silver alginate on the foot 07/27/2020 upon evaluation today patient appears to be doing decently well in regard to his wounds. He still has a lot of dry skin on the left leg. Some of this is starting to peel back and I think he may be able to have them out by removing some that today. Fortunately there is no signs of active infection at this time on the left leg although on the right leg he does appear to have swelling and erythema as well as some mild warmth to touch. This does have been concerned about the possibility of cellulitis although within the differential diagnosis I do think that potentially a DVT has to be at least considered. We need to rule that out before proceeding would just call in the cellulitis. Especially since he is having pain in the posterior aspect of his calf muscle. 2/8; the patient had seen sparingly. He has severe skin changes of chronic lymphedema in the left leg thickened hyperkeratotic verrucous skin. He has an open wound  on the medial part of the left first met head left mid tibia. He also has a rim of nonepithelialized skin in the anterior mid tibia. He brought in the AmLactin lotion that was been prescribed although I am not sure under compression and its utility. There concern about cellulitis on the right lower leg the last time he was here. He was put on on antibiotics. His DVT rule out was negative. The right leg looks fine he is using his stocking on this area 08/10/2020 upon evaluation today patient appears to be doing well with regard to his leg currently. He has been tolerating the dressing changes without complication. Fortunately there is no signs of active infection which is great news. Overall very pleased with where things stand. 2/22; the patient still has an area on the medial part of the left first met his  head. This looks better than when I last saw this earlier this month he has a rim of epithelialization but still some surface debris. Mostly everything on the left leg is healed. There is still a vulnerable in the left mid tibia area. 08/30/2020 upon evaluation today patient appears to be doing much better in regard to his wounds on his foot. 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 Nonetheless I think we will address that today I Minna send in a prescription for him in that regard. Overall though there does not appear to be any signs of significant worsening. 09/07/2020 on evaluation today patient's wounds over his left foot appear to be doing excellent. I do not see any signs of infection there is some callus buildup this can require debridement for certain but overall I feel like he is managing quite nicely. He still using the AmLactin cream which has been beneficial for him as well. 3/22; left foot wound is closed. There is no open area here. He is using ammonium lactate lotion to the lower extremities to help exfoliate dry cracked skin. He has compression stockings from elastic therapy in [place]. The wound on the medial part of his left first met head is healed today. READMISSION 04/12/2021 Mr. Mcginley is a patient we know fairly well he had a prolonged stay in clinic in 2019 with wounds on his left lateral and left anterior lower extremity in the setting of chronic venous insufficiency. More recently he was here earlier this year with predominantly an area on his left foot first metatarsal head plantar and he says the plantar foot broke down on its not long after we discharged him but he did not come back here. The last few months areas of broken down on his left anterior and again the left lateral lower extremity. The leg itself is very swollen chronically  enlarged a lot of hyperkeratotic dry Berry Q skin in the left lower leg. His edema extends well into the thigh. He was seen by Dr. Donzetta Matters. He had ABIs on 03/02/2021 showing an ABI on the right of 1 with a TBI of 0.72 his ABI in the left at 1.09 TBI of 0.99. Monophasic and biphasic waveforms on the right. On the left monophasic waveforms were noted he went on to have an angiogram on 03/27/2021 this showed the aortic aortic and iliac segments were free of flow-limiting stenosis the left common femoral vein to evaluate the left femoral to anterior tibial artery bypass was unobstructed the  bypass was patent without any areas of stenosis. We discharged the patient in bilateral juxta lite stockings but very clearly that was not sufficient to control the swelling and maintain skin integrity. He is clearly going to need compression pumps. 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 This is fortunate because he is on his feet for long periods of time. 10/27; patient comes in with our intake nurse reporting copious amount of green drainage from the left anterior mid tibia the left dorsal foot and to a lesser extent the left medial mid tibia. We left the compression wrap on all week for the amount of edema in his left leg is quite a bit better. We use silver alginate as the primary dressing 11/3; edema control is good. Left anterior lower leg left medial lower leg and the plantar first metatarsal head. The left anterior lower leg required debridement. Deep tissue culture I did of this wound showed MRSA I put him on 10 days of doxycycline which she will start today. We have him in compression wraps. He has a security card and [employer] however he is retiring on November 15. We will need to then get him into a better offloading boot for the left foot perhaps a total CHRISTROPHER, UNTIEDT ([id number]) [phone].pdf Page 4 of 19 contact cast 11/10; edema  control is quite good. Left anterior and left medial lower leg wounds in the setting of chronic venous insufficiency and lymphedema. He also has a substantial area over the left plantar first metatarsal head. 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 He has significant hypergranulation on the left plantar foot wound. The patient is a diabetic but he does not have significant PAD 11/17; edema control is quite good. Left anterior and left medial lower leg wounds look better. The really concerning area remains the area on the left plantar first metatarsal head. He has a rim of epithelialization. 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 Starting today with a forefoot off loader but . possibly a total contact cast. 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 Arrives with a very significant odor from I think all of the wounds on the left leg and the left foot. Because of this I did not put a total contact cast on him today but will could still consider this. His wife was having cataract surgery which is the reason he missed the appointment 12/6. I saw this man 5 days ago with a swelling below the popliteal fossa. I thought he actually might have a Baker's cyst however the DVT rule out study that we could arrange right away was negative the technician told me this was not a ruptured Baker's cyst. We attempted to get this aspirated by under ultrasound guidance in interventional radiology however all they did was an ultrasound however it shows an extensive fluid collection 62 x 8 x 9.4 in the left thigh and left calf. The patient states he thinks this started 8 days ago or so but he really is not  complaining of any pain, fever or systemic  symptoms. He has not ha 12/20; after some difficulty I managed to get the patient into see Dr. Donzetta Matters. Eventually he was taken into the hospital and had a drain put in the fluid collection below his left knee posteriorly extending into the posterior thigh. He still has the drain in place. Culture of this showed moderate staff aureus few Morganella and few Klebsiella he is now on doxycycline and ciprofloxacin as suggested by infectious disease he is on this for a month. The drain will remain in place until it stops draining 12/29; he comes in today with the 1 wound on his left leg and the area on the left plantar first met head significantly smaller. Both look healthy. He still has the drain in the left leg. He says he has to change this daily. Follows up with Dr. Donzetta Matters on January 11. 06/29/2021; the wounds that I am following on the left leg and left first met head continued to be quite healthy. However the area where his inferior drain is in place had copious amounts of drainage which was green in color. The wound here is larger. Follows up with Dr. Gwenlyn Saran of vein and vascular his surgeon next week as well as infectious disease. He remains on ciprofloxacin and doxycycline. He is not complaining of excessive pain in either one of the drain areas 1/12; the patient saw vascular surgery and infectious disease. Vascular surgery has left the drain in place as there was still some notable drainage still see him back in 2 weeks. Dr. Velna Ochs stop the doxycycline and ciprofloxacin and I do not believe he follows up with them at this point. Culture I did last week showed both doxycycline resistant MRSA and Pseudomonas not sensitive to ciprofloxacin although only in rare titers 1/19; the patient's wound on the left anterior lower leg is just about healed. We have continued healing of the area that was medially on the left leg. Left first plantar metatarsal head continues to get  smaller. The major problem here is his 2 drain sites 1 on the left upper calf and lateral thigh. There is purulent drainage still from the left lateral thigh. I gave him antibiotics last week but we still have recultured. He has the drain in the area I think this is eventually going to have to come out. I suspect there will be a connecting wound to heal here perhaps with improved VAc 1/26; the patient had his drain removed by vein and vascular on 1/25/. This was a large pocket of fluid in his left thigh that seem to tunnel into his left upper calf. 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 His mention his Penrose drain was removed today. He now has a tunneling wound on his left calf and left thigh. Both of these probe widely towards each other although I cannot really prove that they connect. Both wounds on his lower leg anteriorly are closed and his area over the first metatarsal head on his right foot continues to improve. We are using Hydrofera Blue here. He also saw infectious disease culture of the abscess they noted was polymicrobial with MRSA, Morganella and Klebsiella he was treated with doxycycline and ciprofloxacin for 4 weeks ending on 07/03/2021. They did not recommend any further antibiotics. Notable that while he still had the Penrose drain in place last week he had purulent drainage coming out of the inferior IandD site this grew [facility], MRSA and Pseudomonas but there does not appear to be any  active infection in this area today with the drain out and he is not systemically unwell 2/2; with regards to the drain sites the superior one on the thigh actually is closed down the one on the upper left lateral calf measures about 8 and half centimeters which is an improvement seems to be less prominent although still with a lot of drainage. The only remaining wound is over the first metatarsal head on the left foot and this looks to be continuing to improve with  Hydrofera Blue. 2/9; the area on his plantar left foot continues to contract. Callus around the wound edge. The drain sites specifically have not come down in depth. We put the wound VAC on Monday he changed the canister late last night our intake nurse reported a pocket of fluid perhaps caused by our compression wraps 2/16; continued improvement in left foot plantar wound. drainage site in the calf is not improved in terms of depth (wound vac) 2/23; continued improvement in the left foot wound over the first metatarsal head. With regards to the drain sites the area on his thigh laterally is healed however the open area on his calf is small in terms of circumference by still probes in by about 15 cm. Within using the wound VAC. Hydrofera Blue on his foot 08/24/2021: The left first metatarsal head wound continues to improve. The wound bed is healthy with just some surrounding callus. Unfortunately the open drain site on his calf remains open and tunnels at least 15 cm (the extent of a Q-tip). This is despite several weeks of wound VAC treatment. Based on reading back through the notes, there has been really no significant change in the depth of the wound, although the orifice is smaller and the more cranial wound on his thigh has closed. I suspect the tunnel tracks nearly all the way to this location. 08/31/2021: Continued improvement in the left first metatarsal head wound. There has been absolutely no improvement to the long tunnel from his open drain site on his calf. We have tried to get him into see vascular surgery sooner to consider the possibility of simply filleting the tract open and allowing it to heal from the bottom up, likely with a wound VAC. They have not yet scheduled a sooner appointment than his current mid April 09/14/2021: He was seen by vascular surgery and they took him to the operating room last week. They opened a portion of the tunnel, but did not extend the entire length of the  known open subcutaneous tract. I read Dr. Claretha Cooper operative note and it is not clear from that documentation why only a portion of the tract was opened. The heaped up granulation tissue was curetted and removed from at least some portion of the tract. They did place a wound VAC and applied an Unna boot to the leg. The ulcer on his left first metatarsal head is smaller today. The bed looks good and there is just a small amount of surrounding callus. 09/21/2021: The ulcer on his left first metatarsal head looks to be stalled. There is some callus surrounding the wound but the wound bed itself does not appear particularly dynamic. The tunnel tract on his lateral left leg seems to be roughly the same length or perhaps slightly smaller but the wound bed appears healthy with good granulation tissue. He opened up a new wound on his medial thigh and the site of a prior surgical incision. He says that he did this unconsciously in his sleep  by scratching. 09/28/2021: Unfortunately, the ulcer on his left first metatarsal head has extended underneath the callus toward the dorsum of his foot. The medial thigh wounds are roughly the same. The tunnel on his lateral left leg continues to be problematic; it is longer than we are able to actually probe with a Q-tip. I am still not certain as to why Dr. Donzetta Matters did not open this up entirely when he took the patient to the operating room. We will likely be back in the same situation with just a small superficial opening in a long unhealed tract, as the open portion is granulating in nicely. 10/02/2021: The patient was initially scheduled for a nurse visit, but we are also applying a total contact cast today. The plantar foot wound looks clean without CHAS, BRANAN ([id number]) [phone] Page 5 of 19 significant accumulated callus. We have been applying Prisma silver collagen to the site. 10/05/2021: The patient is here for his first total contact cast  change. We have tried using gauze packing strips in the tunnel on his lateral leg wound, but this does not seem to be working any better than the white VAC foam. The foot ulcer looks about the same with minimal periwound callus. Medial thigh wound is clean with just some overlying eschar. 10/12/2021: The plantar foot wound is stable without any significant accumulation of periwound callus. The surface is viable with good granulation tissue. The medial thigh wounds are much smaller and are epithelializing. On the other hand, he had purulent drainage coming from the tunnel on his lateral leg. He does go back to see Dr. Donzetta Matters next week and is planning to ask him why the wound tunnel was not completely opened at the time of his most recent operation. 10/19/2021: The plantar foot wound is markedly improved and has epithelial tissue coming through the surface. The medial thigh wounds are nearly closed with just a tiny open area. He did see Dr. Donzetta Matters earlier this week and apparently they did discuss the possibility of opening the sinus tract further and enabling a wound VAC application. Apparently there are some limits as to what Dr. 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 I think if we could get the tract open to the level of the popliteal fossa, this would greatly aid in her ability to get this chart closed. That being said, however, today when I probed the tract with a Q-tip, I was not able to insert the entirety of the Q-tip as I have on previous occasions. The tunnel is shorter by about 4 cm. The surface is clean with good granulation tissue and no further episodes of purulent drainage. 10/30/2021: Last week, the patient underwent surgery and had the long tract in his leg opened. There was a rind that was debrided, according to the operative report. His medial thigh ulcers are closed. The plantar foot wound is clean with a good surface and some built up surrounding  callus. 11/06/2021: The overall dimensions of the large wound on his lateral leg remain about the same, but there is good granulation tissue present and the tunneling is a little bit shorter. He has a new wound on his anterior tibial surface, in the same location where he had a similar lesion in the past. The plantar foot wound is clean with some buildup surrounding callus. Just toward the medial aspect of his foot, however, there is an area of darkening that once debrided, revealed another opening in the skin surface. 11/13/2021:  The anterior tibial surface wound is closed. The plantar foot wound has some surrounding callus buildup. The area of darkening that I debrided last week and revealed an opening in the skin surface has closed again. The tunnel in the large wound on his lateral leg has come in by about 3 cm. There is healthy granulation tissue on the entire wound surface. 11/23/2021: The patient was out of town last week and did wet-to-dry dressings on his large wound. 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 Unfortunately, he picked open the wound on his left medial thigh. He says that it was itching and he just could not stop scratching it until it was open again. The wound on his plantar foot is smaller and has not accumulated a tremendous amount of callus. The lateral leg wound is shallower and the tunnel has also decreased in depth. There is just a little bit of slough accumulation on the surface. 11/30/2021: Another portion of his left medial thigh has been opened up. All of these wounds are fairly superficial with just a little bit of slough and eschar accumulation. The wound on his plantar foot is almost closed with just a bit of eschar and periwound callus accumulation. The lateral leg wound is nearly flush with the surrounding skin and the tunnel is markedly shallower. 12/07/2021: There is just 1 open area on his left  medial thigh. It is clean with just a little bit of perimeter eschar. The wound on his plantar foot continues to contract and just has some eschar and periwound callus accumulation. The lateral leg wound is closing at the more distal aspect and the tunnel is smaller. The surface is nearly flush with the surrounding skin and it has a good bed of granulation tissue. 12/14/2021: The thigh and foot wounds are closed. The lateral leg wound has closed over approximately half of its length. The tunnel continues to contract and the surface is now flush with the surrounding skin. The wound bed has robust granulation tissue. 12/22/2021: The thigh and foot wounds have reopened. The foot wound has a lot of callus accumulation around and over it. The thigh wound is tiny with just a little bit of slough in the wound bed. The lateral leg wound continues to contract. His vascular surgeon took the wound VAC off earlier in the week and the patient has been doing wet-to-dry dressings. There is a little slough accumulation on the surface. The tunnel is about 3 cm in depth at this point. 12/28/2021: The thigh wound is closed again. The foot wound has some callus that subsequently has peeled back exposing just a small slit of a wound. The lateral leg wound Is down to about half the size that it originally was and the tunnel is down to about half a centimeter in depth. 01/04/2022: The thigh wound remains closed. The foot wound has heavy callus overlying the wound site. Once this was debrided, the wound was found to be closed. The lateral leg wound is smaller again this week and very superficial. No tunnel could be identified. 01/12/2022: The thigh and foot wounds both remain closed. The lateral leg wound is now nearly flush with the skin surface. There is good granulation tissue present with a light layer of slough. 01/19/2022: Due to the way his wrap was placed, the patient did not change the dressing on his thigh at all and so  the foam was saturated and his skin  is macerated. There is a light layer of slough on the wound surface. The underlying granulation tissue is robust and healthy-appearing. He has heavy callus buildup at the site of his first metatarsal head wound which is still healed. 02/01/2022: He has been in silver alginate. When he removed the dressing from his thigh wound, however, some leg, superficially reopening a portion of the wound that had healed. In addition, underneath the callus at his left first metatarsal head, there appears to be a blister and the wound appears to be open again. 02/08/2022: The lateral leg wound has contracted substantially. There is eschar and a light layer of slough present. He says that it is starting to pull and is uncomfortable. On inspection, there is some puckering of the scar and the eschar is quite dry; this may account for his symptoms. On his first metatarsal head, the wound is much smaller with just some eschar on the surface. The callus has not reaccumulated. He reports that he had a blister come up on his medial thigh wound at the distal aspect. It popped and there is now an opening in his skin again. Looking back through his [place] of wound photos, there is what looks like a permanent suture just deep to this location and it may be trying to erode through. We have been using silver alginate on his wounds. 02/15/2022: The lateral leg wound is about half the size it was last week. It is clean with just a little perimeter eschar and light slough. The wound on his first metatarsal head is about the same with heavy callus overlying it. The medial thigh wound is closed again. He does have some skin changes on the top of his foot that looks potentially yeast related. 02/22/2022: The skin on the top of his foot improved with the use of a topical antifungal. The lateral leg wound continues to contract and is again smaller this week. There is a little bit of slough and eschar on  the surface. The first metatarsal head wound is a little bit smaller but has reaccumulated a thick callus over the top. He decided to try to trim his toenail and ultimately took the entire nail off of his left great toe. 03/02/2022: His lateral leg wound continues to improve, as does the wound on his left great toe. Unfortunately, it appears that somehow his foot got wet and moisture seeped in through the opening causing his skin to lift. There is a large wound now overlying his first metatarsal on both the plantar, medial, and dorsal portion of his foot. There is necrotic tissue and slough present underneath the shaggy macerated skin. 03/08/2022: The lateral leg wound is smaller again today. There is just a light layer of slough and eschar on the surface. The great toe wound is smaller again today. The first metatarsal wound is a little bit smaller today and does not look nearly as necrotic and macerated. There is still slough and nonviable tissue present. 03/15/2022: The lateral leg wound is narrower and just has a little bit of light slough buildup. The first metatarsal wound still has a fair amount of moisture affecting the periwound skin. The great toe wound is healed. 03/22/2022: The lateral leg wound is now isolated to just at the level of his knee. There is some eschar and slough accumulation. The first metatarsal head wound has epithelialized tremendously and is about half the size that it was last week. He still has some maceration on the top of his foot  and a fungal odor is JYRIN, MORIARTY ([id number]) [id number] Page 6 of 19 present. 03/29/2022: T oday the patient's foot was macerated, suggesting that the cast got wet. The patient has also been picking at his dry skin and has enlarged the wound on his left lateral leg. In the time between having his cast removed and my evaluation, he had picked more dry skin and opened up additional wounds on his Achilles area and  dorsal foot. The plantar first metatarsal head wound, however, is smaller and clean with just macerated callus around the perimeter and light slough on the surface. The lateral leg wound measured a little bit larger but is also fairly clean with eschar and minimal slough. 04/02/2022: The patient had vascular studies done last Friday and so his cast was not applied. He is here today to have that done. 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 04/05/2022: Both wounds are smaller and quite clean. There is just a little biofilm on the lateral leg wound. 10/20; the patient has a wound on the left lateral surgical incision at the level of his lateral knee this looks clean and improved. He is using silver alginate. He also has an area on his left medial foot for which she is using Hydrofera Blue under a total contact cast both wounds are measuring smaller 04/20/2022: The plantar foot wound has contracted considerably and is very close to closing. The lateral leg wound was measured a little larger, but there was a tiny open area that was included in the measurements that was not included last week. He has some eschar around the perimeter but otherwise the wound looks clean. 04/27/2022: The lateral leg wound looks better this week. He says that midweek, he felt it was very dry and began applying hydrogel to the site. I think this was beneficial. The foot wound is nearly closed underneath a thick layer of dry skin and callus. 05/04/2022: The foot wound is healed. He has developed a new small ulcer on his anterior tibial surface about midway up his leg. It has a little slough on the surface. The lateral leg wound still is fairly dry, but clean with just a little biofilm on the surface. 05/11/2022: The wound on his foot reopened on Wednesday. A large blister formed which then broke open revealing the fat layer underneath. The ulcer on his anterior tibial surface is a little bit larger this week. The  lateral leg wound has much better moisture balance this week. Fortunately, prior to his foot wound reopening, he did get the cast made for his orthotic. 05/15/2022: Already, the left medial foot wound has improved. The tissue is less macerated and the surface is clean. The ulcer on his anterior tibial surface continues to enlarge. This seems likely secondary to accumulated moisture. The lateral leg wound continues to have an improved moisture balance with the use of collagen. 05/25/2022: The medial foot wound continues to contract. It is now substantially smaller with just a little slough on the surface. The anterior tibial surface wound continues to enlarge further. Once again, this seems to be secondary to moisture. The lateral leg wound does not seem to be changing much in size, but the moisture balance is better. 06/01/2022: The anterior tibial wound is closed. The medial foot wound is down to just a very small, couple of millimeters, opening. The lateral leg wound has good moisture balance, but remains unchanged in size. 12/15; the patient's anterior tibial wound has reopened, however the area  on his right first metatarsal head is closed. The major wound is actually on the superior part of his surgical wound in the left lateral thigh. Not a completely viable surface under illumination. This may at some point require a debridement I think he is currently using Prisma. As noted the left medial foot wound has closed 06/14/2022: The anterior tibial wound has closed. The lateral leg wound has a better surface but is basically unchanged in size. The left medial foot wound has reopened. It looks as though there was some callus accumulation and moisture got under the callus which caused the tissue to break down again. 06/21/2022: A new wound has opened up just distal to the previous anterior tibial wound. It is small but has hypertrophic granulation tissue present. The lateral leg wound is a little bit  narrower and has a layer of slough on the surface. The left medial foot wound is down to just a pinhole. His custom orthotics should be available next week. 06/28/2022: The wound on his first metatarsal head has healed. He has developed a new small wound on his medial lower leg, in an old scar site. The lateral leg wound continues to contract but continues to accumulate slough, as well. 07/03/2022: Despite wearing his custom orthopedic shoes, he managed to reopen the wound on his first metatarsal head. He says he thinks his foot got wet and then some skin lifted up and he peeled this away. Both of the lower leg wounds are smaller and have some dry eschar on the surface. The lateral leg wound is quite a bit narrower today. 07/12/2022: The medial lower leg wound is closed. The anterior lower leg wound has contracted considerably. The lateral upper leg wound is narrower with a layer of slough on the surface. The first metatarsal head wound is also smaller, but had copious drainage which saturated the foam border dressing and resulted in some periwound tissue maceration. Fortunately there was no breakdown at this site. 07/19/2022: The lower leg shows signs of significant maceration; I think he must be sweating excessively inside his cast. There are several areas of skin breakdown present. The wound on his foot is smaller and that on his lateral leg is narrower and is shorter by about a centimeter. 07/26/2022: Last week we used a zinc Coflex wrap prior to applying his total contact cast and this has had the effect of keeping his skin from getting macerated this week. The anterior leg wound has epithelialized substantially. The lateral leg wound is significantly smaller with just a bit of slough on the surface. The first metatarsal head wound is also smaller this week. 08/02/2022: The anterior leg wound was closed on arrival, but while he was sitting in the room, he picked it open again. The lateral leg wound is  smaller with just a little slough on the surface and the first metatarsal head wound has contracted further, as well. 08/09/2022: The first metatarsal head wound is covered with callus. Underneath the callus, it is nearly completely closed. The lateral leg wound is smaller again this week. The anterior leg wound looks better, but he has such heavy buildup of old skin, that moisture is getting underneath this, becoming trapped, and causing the underlying skin to get macerated and open up. 08/16/2022: The first metatarsal head wound is closed. The lateral leg wound continues to contract and is quite a bit smaller again this week. There is just a small, superficial opening remaining on his anterior tibial surface. Electronic Signature(s) Signed:  08/16/2022 11:29:21 AM By: Fredirick Maudlin MD FACS Entered By: Fredirick Maudlin on 08/16/2022 11:29:20 [name] ([id number]) [id number] Page 7 of 19 -------------------------------------------------------------------------------- Physical Exam Details Patient Name: Date of Service: JACOBS, TISE 08/16/2022 10:45 A M Medical Record Number: [id number] Patient Account Number: [id number] Date of Birth/Sex: Treating RN: 1951/01/17 (72 y.o. M) Primary Care Provider: [name] Other Clinician: Referring Provider: Treating Provider/Extender: [name] Constitutional Slightly hypertensive. . . . no acute distress. Respiratory Normal work of breathing on room air. Notes 08/16/2022: The first metatarsal head wound is closed. The lateral leg wound continues to contract and is quite a bit smaller again this week. There is just a small, superficial opening remaining on his anterior tibial surface. Electronic Signature(s) Signed: 08/16/2022 11:29:57 AM By: Fredirick Maudlin MD FACS Entered By: Fredirick Maudlin on 08/16/2022  11:29:56 -------------------------------------------------------------------------------- Physician Orders Details Patient Name: Date of Service: [name]. 08/16/2022 10:45 A M Medical Record Number: [id number] Patient Account Number: [id number] Date of Birth/Sex: Treating RN: Nov 06, 1950 (72 y.o. 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 - Dr Celine Ahr - Room 2 Return A Anesthetic Wound #22 Left,Proximal,Lateral Lower Leg (In clinic) Topical Lidocaine 4% applied to wound bed Bathing/ Shower/ Hygiene May shower with protection but do not get wound dressing(s) wet. Protect dressing(s) with water repellant cover (for example, large plastic bag) or a cast cover and may then take shower. Edema Control - Lymphedema / SCD / Other Avoid standing for long periods of time. Patient to wear own compression stockings every day. - on right leg; Moisturize legs daily. Compression stocking or Garment 20-30 mm/Hg pressure to: - left leg daily DERREK, CAVNESS ([id number]) [phone] Page 8 of 19 Off-Loading Other: - minimal weight bearing left foot Wound Treatment Wound #18R - Metatarsal head first Wound Laterality: Plantar, Left Cleanser: Soap and Water 1 x Per Week/30 Days Discharge Instructions: May shower and wash wound with dial antibacterial soap and water prior to dressing change. Cleanser: Wound Cleanser  1 x Per Week/30 Days Discharge Instructions: Cleanse the wound with wound cleanser prior to applying a clean dressing using gauze sponges, not tissue or cotton balls. Prim Dressing: Sorbalgon AG Dressing 2x2 (in/in) 1 x Per Week/30 Days ary Discharge Instructions: Apply to wound bed as instructed Secondary Dressing: ALLEVYN Heel 4 1/2in x 5 1/2in / 10.5cm x 13.5cm 1 x Per Week/30 Days Discharge Instructions: Apply over primary dressing as directed. Secondary Dressing: Optifoam Non-Adhesive Dressing, 4x4 in 1 x Per Week/30 Days Discharge Instructions: Apply over primary dressing as directed. Secondary Dressing: Zetuvit Plus Silicone Border Dressing 4x4 (in/in) 1 x Per Week/30 Days Discharge Instructions: Apply silicone border over primary dressing as directed. Wound #22 - Lower Leg Wound Laterality: Left, Lateral, Proximal Cleanser: Soap and Water 3 x Per Week/30 Days Discharge Instructions: May shower and wash wound with dial antibacterial soap and water prior to dressing change. Cleanser: Wound Cleanser 3 x Per Week/30 Days Discharge Instructions: Cleanse the wound with wound cleanser prior to applying a clean dressing using  gauze sponges, not tissue or cotton balls. Peri-Wound Care: Sween Lotion (Moisturizing lotion) 3 x Per Week/30 Days Discharge Instructions: Apply moisturizing lotion to the leg Topical: Skintegrity Hydrogel 4 (oz) 3 x Per Week/30 Days Discharge Instructions: Apply hydrogel as directed Prim Dressing: Endoform 2x2 in 3 x Per Week/30 Days ary Discharge Instructions: Moisten with Hydrogel or saline Secondary Dressing: ABD Pad, 8x10 (Generic) 3 x Per Week/30 Days Discharge Instructions: Apply over primary dressing as directed. Secured With: Elastic Bandage 4 inch (ACE bandage) 3 x Per Week/30 Days Discharge Instructions: Secure with ACE bandage as directed. Wound #30 - Lower Leg Wound Laterality: Left, Anterior Cleanser: Soap and Water 1 x Per Week/30 Days Discharge  Instructions: May shower and wash wound with dial antibacterial soap and water prior to dressing change. Cleanser: Wound Cleanser 1 x Per Week/30 Days Discharge Instructions: Cleanse the wound with wound cleanser prior to applying a clean dressing using gauze sponges, not tissue or cotton balls. Prim Dressing: Sorbalgon AG Dressing 2x2 (in/in) 1 x Per Week/30 Days ary Discharge Instructions: Apply to wound bed as instructed Secondary Dressing: Zetuvit Plus Silicone Border Dressing 4x4 (in/in) 1 x Per Week/30 Days Discharge Instructions: Apply silicone border over primary dressing as directed. Compression Wrap: CoFlex TLC Zinc 1 x Per Week/30 Days Electronic Signature(s) Signed: 08/16/2022 12:40:26 PM By: Fredirick Maudlin MD FACS Signed: 08/16/2022 4:46:19 PM By: Blanche East RN Previous Signature: 08/16/2022 11:32:37 AM Version By: Fredirick Maudlin MD FACS Entered By: Blanche East on 08/16/2022 12:15:36 [name] ([id number]) [id number] Page 9 of 19 -------------------------------------------------------------------------------- Problem List Details Patient Name: Date of Service: VOLLIE, MARTIE 08/16/2022 10:45 A M Medical Record Number: [id number] Patient Account Number: [id number] Date of Birth/Sex: Treating RN: [birth year] (72 y.o. M) Primary Care Provider: [name] Other Clinician: Referring Provider: Treating Provider/Extender: [name] in Treatment: 44 Active Problems ICD-10 Encounter Code Description Active Date MDM Diagnosis L97.828 Non-pressure chronic ulcer of other part of left lower leg with other specified 04/12/2021 No Yes severity E11.51 Type 2 diabetes mellitus with diabetic peripheral angiopathy without gangrene 04/12/2021 No Yes I89.0 Lymphedema, not elsewhere classified 04/12/2021 No Yes I87.322 Chronic venous hypertension (idiopathic) with inflammation of left lower 04/12/2021 No  Yes extremity Inactive Problems ICD-10 Code Description Active Date Inactive Date L97.828 Non-pressure chronic ulcer of other part of left lower leg with other specified severity 06/08/2022 06/08/2022 E11.42 Type 2 diabetes mellitus with diabetic polyneuropathy 04/12/2021 04/12/2021 L02.416 Cutaneous abscess of left lower limb 06/13/2021 06/13/2021 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 07/20/2021 07/20/2021 L97.528 Non-pressure chronic ulcer of other part of left foot with other specified severity 04/12/2021 04/12/2021 [id number] Type 2 diabetes mellitus with foot ulcer 04/12/2021 04/12/2021 Resolved Problems Electronic Signature(s) Signed: 08/16/2022 11:25:45 AM By: Fredirick Maudlin MD FACS Entered By: Fredirick Maudlin on 08/16/2022 11:25:45 -------------------------------------------------------------------------------- Progress Note Details Patient Name: Date of Service: [name]. 08/16/2022 10:45 A M Medical Record Number: [id number] Patient Account Number: [id number] FINNEGAN, CARKHUFF ([id number]) [phone].pdf Page 10 of 19 Date of Birth/Sex: Treating RN: 1950/11/06 (72 y.o. M) Primary Care Provider: Other Clinician: [name] Referring Provider: Treating Provider/Extender: [name] in Treatment: 64 Subjective Chief Complaint Information obtained from Patient Left leg and foot ulcers 04/12/2021; patient is here for wounds on his left lower leg and left plantar foot over the first metatarsal head History of Present Illness (HPI) 10/11/17; Mr. Heidinger is a 72 year old man who tells me that in  2015 he slipped down the latter traumatizing his left leg. He developed a wound in the same spot the area that we are currently looking at. He states this closed over for the most part although he always felt it was somewhat unstable. In 2016 he hit the same area with the door of his car had this  reopened. He tells me that this is never really closed although sometimes an inflow it remains open on a constant basis. He has not been using any specific dressing to this except for topical antibiotics the nature of which were not really sure. His primary doctor did send him to see Dr. [name] of interventional cardiology. He underwent an angiogram on 08/06/17 and he underwent a PTA and directional atherectomy of the lesser distal SFA and popliteal arteries which resulted in brisk improvement in blood flow. It was noted that he had 2 vessel runoff through the anterior tibial and peroneal. He is also been to see vascular and interventional radiologist. He was not felt to have any significant superficial venous insufficiency. Presumably is not a candidate for any ablation. It was suggested he come here for wound care. The patient is a type II diabetic on insulin. He also has a history of venous insufficiency. ABIs on the left were noncompressible in our clinic 10/21/17; patient we admitted to the clinic last week. 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 We put Iodosorb on him after an aggressive debridement and 3 layer compression. He complained of pain in his ankle and itching with is skin in fact he scratched the area on the medial calf superiorly at the rim of our wraps and he has 2 small open areas in that location today which are new. I changed his primary dressing today to silver collagen. As noted he is already had revascularization and does not have any significant superficial venous insufficiency that would be amenable to ablation 10/28/17; patient admitted to the clinic 2 weeks ago. He has a smaller Wound. Scratch injury from last week revealed. There is large wound over the tibial area. This is smaller. Granulation looks healthy. No need for debridement. 11/04/17; the wound on the left lateral calf looks better. Improved  dimensions. Surface of this looks better. We've been maintaining him and Kerlix Coban wraps. He finds this much more comfortable. Silver collagen dressing 11/11/17; left lateral Wound continues to look healthy be making progress. Using a #5 curet I removed removed nonviable skin from the surface of the wound and then necrotic debris from the wound surface. Surface of the wound continues to look healthy. ooHe also has an open area on the left great toenail bed. We've been using topical antibiotics. 11/19/17; left anterior lateral wound continues to look healthy but it's not closed. ooHe also had a small wound above this on the left leg ooInitially traumatic wounds in the setting of significant chronic venous insufficiency and stasis dermatitis 11/25/17; left anterior wounds superiorly is closed still a small wound inferiorly. 12/02/17; left anterior tibial area. Arrives today with adherent callus. Post debridement clearly not completely closed. Hydrofera Blue under 3 layer compression. 12/09/17; left anterior tibia. Circumferential eschar however the wound bed looks stable to improved. We've been using Hydrofera Blue under 3 layer compression 12/17/17; left anterior tibia. Apparently this was felt to be closed however when the wrap was taken off there is a skin tear to reopen wounds in the same area we've been using  Hydrofera Blue under 3 layer compression 12/23/17 left anterior tibia. Not close to close this week apparently the [facility] was stuck to this again. Still circumferential eschar requiring debridement. I put a contact layer on this this time under the Hydrofera Blue 12/31/17; left anterior tibia. Wound is better slight amount of hyper-granulation. Using Hydrofera Blue over Adaptic. 01/07/18; left anterior tibia. The wound had some surface eschar however after this was removed he has no open wound.he was already revascularized by Dr. [name] when he came to our clinic with atherectomy of the  left SFA and popliteal artery. He was also sent to interventional radiology for venous reflux studies. He was not felt to have significant reflux but certainly has chronic venous changes of his skin with hemosiderin deposition around this area. He will definitely need to lubricate his skin and wear compression stocking and I've talked to him about this. READMISSION 05/26/2018 This is a now 72 year old man we cared for with traumatic wounds on his left anterior lower extremity. He had been previously revascularized during that admission by Dr. [name]. Apparently in follow-up Dr. [name] noted that he had deterioration in his arterial status. He underwent a stent placement in the distal left SFA on 04/22/2018. 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 He went back to the angiography suite on 04/30/2018 he underwent PTA and balloon angioplasty of the occluded left mid anterior tibial artery, thrombotic occlusion went from 100 to 0% which reconstitutes the posterior tibial artery. He had thrombectomy and aspiration of the peroneal artery. The stent placed in the distal SFA left SFA was still occluded. 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 He has been recuperating at [facility] in [facility] after the most recent hospitalization. He is going home tomorrow. He tells me that 2 weeks ago he traumatized the tip of his left fifth toe. He came in urgently for our review of this. This was a history of before I noted that Dr. [name] had already noted dry gangrenous changes of the left fifth toe 06/09/2018; 2-week follow-up. I did contact Dr. [name] after his last appointment and he apparently saw 1 of Dr. Irven Shelling colleagues the next day. He  does not follow-up with Dr. [name] himself until Thursday of this week. 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 Nevertheless there is no evidence of infection no drainage and no pain. He had a new area that this week when we were signing him in today on the left anterior mid tibia area, this is in close proximity to the previous wound we have dealt with in this clinic. 06/23/2018; 2-week follow-up. I did not receive a recent note from Dr. [name] to review today. Our office is trying to obtain this. He is apparently not planning to do further vascular interventions and wondered about compression to try and help with the patient's chronic venous insufficiency. However we are also concerned about the arterial flow. ooHe arrives in clinic today with a new area on the left third toe. The areas on the calf/anterior tibia are close to closing. The left fifth toe is still mummified using Betadine. -In reviewing things with the patient he has what sounds like claudication with mild  to moderate amount of activity. 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 I prescribed Levaquin over the phone while we attempted to arrange a plan of care. However the patient called yesterday to report he had low-grade fever and he came in today acutely. There is been a marked deterioration in the left third toe with spreading cellulitis up into the dorsal left foot. He was referred to the emergency room. Readmission: 06/29/2020 patient presents today for reevaluation here in our clinic he was previously treated by Dr. Dellia Nims at the latter part of 2019 in 2 the beginning of 2020. Subsequently we have not seen him since that time in the interim he did have evaluation with vein and vascular specialist specifically Dr. Anice Paganini who did perform quite extensive work for a left femoral to anterior tibial artery bypass. With that being said in the interim the patient has developed  significant MOHANAD, GUSTAVE ([id number]) [id number] Page 11 of 19 lymphedema and has wounds that he tells me have really never healed in regard to the incision site on the left leg. He also has multiple wounds on the feet for various reasons some of which is that he tends to pick at his feet. Fortunately there is no signs of active infection systemically at this time he does have some wounds that are little bit deeper but most are fairly superficial he seems to have good blood flow and overall everything appears to be healthy I see no bone exposed and no obvious signs of osteomyelitis. I do not know that he necessarily needs a x-ray at this point although that something we could consider depending on how things progress. The patient does have a history of lymphedema, diabetes, this is type II, chronic kidney disease stage III, hypertension, and history of peripheral vascular disease. 07/05/2020; patient admitted last week. Is a patient I remember from 2019 he had a spreading infection involving the left foot and we sent him to the hospital. 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 He subsequently had a left femoral to anterior tibial bypass by Dr.Cain vein and vascular. He also has severe lymphedema with chronic skin changes related to that on the left leg. The most problematic area that was new today was on the left medial great toe. This was apparently a small area last week there was purulent drainage which our intake nurse cultured. Also areas on the left medial foot and heel left lateral foot. He has 2 areas on the left medial calf left lateral calf in the setting of the severe lymphedema. 07/13/2020 on evaluation today patient appears to be doing better in my opinion compared to his last visit. The good news is there is no signs of active infection systemically and locally I do not see any signs of infection either. He did have an  x-ray which was negative that is great news he had a culture which showed MRSA but at the same time he is been on the doxycycline which has helped. I do think we may want to extend this for 7 additional days 1/25; patient admitted to the clinic a few weeks ago. 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 We have been putting him under compression his edema control is a lot better but he is severe verricused skin on the left leg. He is really done quite well he still has an open area  on the left medial calf and the left medial first metatarsal head. We have been using silver collagen on the leg silver alginate on the foot 07/27/2020 upon evaluation today patient appears to be doing decently well in regard to his wounds. He still has a lot of dry skin on the left leg. Some of this is starting to peel back and I think he may be able to have them out by removing some that today. Fortunately there is no signs of active infection at this time on the left leg although on the right leg he does appear to have swelling and erythema as well as some mild warmth to touch. This does have been concerned about the possibility of cellulitis although within the differential diagnosis I do think that potentially a DVT has to be at least considered. We need to rule that out before proceeding would just call in the cellulitis. Especially since he is having pain in the posterior aspect of his calf muscle. 2/8; the patient had seen sparingly. He has severe skin changes of chronic lymphedema in the left leg thickened hyperkeratotic verrucous skin. He has an open wound on the medial part of the left first met head left mid tibia. He also has a rim of nonepithelialized skin in the anterior mid tibia. He brought in the AmLactin lotion that was been prescribed although I am not sure under compression and its utility. There concern about cellulitis on the right lower leg the last time he  was here. He was put on on antibiotics. His DVT rule out was negative. The right leg looks fine he is using his stocking on this area 08/10/2020 upon evaluation today patient appears to be doing well with regard to his leg currently. He has been tolerating the dressing changes without complication. Fortunately there is no signs of active infection which is great news. Overall very pleased with where things stand. 2/22; the patient still has an area on the medial part of the left first met his head. This looks better than when I last saw this earlier this month he has a rim of epithelialization but still some surface debris. Mostly everything on the left leg is healed. There is still a vulnerable in the left mid tibia area. 08/30/2020 upon evaluation today patient appears to be doing much better in regard to his wounds on his foot. 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 Nonetheless I think we will address that today I Minna send in a prescription for him in that regard. Overall though there does not appear to be any signs of significant worsening. 09/07/2020 on evaluation today patient's wounds over his left foot appear to be doing excellent. I do not see any signs of infection there is some callus buildup this can require debridement for certain but overall I feel like he is managing quite nicely. He still using the AmLactin cream which has been beneficial for him as well. 3/22; left foot wound is closed. There is no open area here. He is using ammonium lactate lotion to the lower extremities to help exfoliate dry cracked skin. He has compression stockings from elastic therapy in [place]. The wound on the medial part of his left first met head is healed today. READMISSION 04/12/2021 Mr. Bonsell is a patient we know fairly well he had a prolonged stay in clinic in  2019 with wounds on his left lateral and left anterior lower extremity in the setting of chronic venous insufficiency. More recently he was here earlier this year with predominantly an area on his left foot first metatarsal head plantar and he says the plantar foot broke down on its not long after we discharged him but he did not come back here. The last few months areas of broken down on his left anterior and again the left lateral lower extremity. The leg itself is very swollen chronically enlarged a lot of hyperkeratotic dry Berry Q skin in the left lower leg. His edema extends well into the thigh. He was seen by Dr. Donzetta Matters. He had ABIs on 03/02/2021 showing an ABI on the right of 1 with a TBI of 0.72 his ABI in the left at 1.09 TBI of 0.99. Monophasic and biphasic waveforms on the right. On the left monophasic waveforms were noted he went on to have an angiogram on 03/27/2021 this showed the aortic aortic and iliac segments were free of flow-limiting stenosis the left common femoral vein to evaluate the left femoral to anterior tibial artery bypass was unobstructed the bypass was patent without any areas of stenosis. We discharged the patient in bilateral juxta lite stockings but very clearly that was not sufficient to control the swelling and maintain skin integrity. He is clearly going to need compression pumps. 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 This is fortunate because he is on his feet for long periods of time. 10/27; patient comes in with our intake nurse reporting copious amount of green drainage from the left anterior mid tibia the left dorsal foot and to a lesser extent the left medial mid tibia. We left the compression wrap on all week for the amount of edema in his left leg is quite a bit better. We use silver alginate as the primary dressing 11/3; edema control is good. Left anterior lower leg left medial lower leg and the  plantar first metatarsal head. The left anterior lower leg required debridement. Deep tissue culture I did of this wound showed MRSA I put him on 10 days of doxycycline which she will start today. We have him in compression wraps. He has a security card and [employer] however he is retiring on November 15. We will need to then get him into a better offloading boot for the left foot perhaps a total contact cast 11/10; edema control is quite good. Left anterior and left medial lower leg wounds in the setting of chronic venous insufficiency and lymphedema. He also has a substantial area over the left plantar first metatarsal head. 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 He has significant hypergranulation on the left plantar foot wound. The patient is a diabetic but he does not have significant PAD 11/17; edema control is quite good. Left anterior and left medial lower leg wounds look better. The really concerning area remains the area on the left plantar first metatarsal head. He has a rim of epithelialization. 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 Starting today with a forefoot off loader but . possibly a total contact cast. 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 Arrives with a very significant odor from I think all of the wounds on the left leg and the left foot. Because of this I did not put a total contact cast on him today but will could still consider this. His wife was having cataract surgery which is the reason he missed the appointment 12/6. I saw this man 5 days ago with a swelling below the popliteal fossa. I thought he actually might have a Baker's cyst however the DVT rule  out study that we could arrange right away was negative the technician told me this was not a ruptured Baker's cyst. We attempted to get this aspirated by under ultrasound guidance in interventional radiology however all they did was an ultrasound however it shows an extensive fluid collection 62 x 8 x 9.4 in the left thigh and left calf. The patient states he thinks this started 8 days ago or so but he really is not complaining of any pain, fever or systemic symptoms. He has not ha MALCUM, LAHUE ([id number]) [id number] Page 12 of 19 12/20; after some difficulty I managed to get the patient into see Dr. Donzetta Matters. Eventually he was taken into the hospital and had a drain put in the fluid collection below his left knee posteriorly extending into the posterior thigh. He still has the drain in place. Culture of this showed moderate staff aureus few Morganella and few Klebsiella he is now on doxycycline and ciprofloxacin as suggested by infectious disease he is on this for a month. The drain will remain in place until it stops draining 12/29; he comes in today with the 1 wound on his left leg and the area on the left plantar first met head significantly smaller. Both look healthy. He still has the drain in the left leg. He says he has to change this daily. Follows up with Dr. Donzetta Matters on January 11. 06/29/2021; the wounds that I am following on the left leg and left first met head continued to be quite healthy. However the area where his inferior drain is in place had copious amounts of drainage which was green in color. The wound here is larger. Follows up with Dr. Gwenlyn Saran of vein and vascular his surgeon next week as well as infectious disease. He remains on ciprofloxacin and doxycycline. He is not complaining of excessive pain in either one of the drain areas 1/12; the patient saw vascular surgery and infectious disease. Vascular surgery has left the drain in place as there was still some  notable drainage still see him back in 2 weeks. Dr. Velna Ochs stop the doxycycline and ciprofloxacin and I do not believe he follows up with them at this point. Culture I did last week showed both doxycycline resistant MRSA and Pseudomonas not sensitive to ciprofloxacin although only in rare titers 1/19; the patient's wound on the left anterior lower leg is just about healed. We have continued healing of the area that was medially on the left leg. Left first plantar metatarsal head continues to get smaller. The major problem here is his 2 drain sites 1 on the left upper calf and lateral thigh. There is purulent drainage still from the left lateral thigh. I gave him antibiotics last week but we still have recultured. He has the drain in the area I think this is eventually going to have to come out. I suspect there will be a  connecting wound to heal here perhaps with improved VAc 1/26; the patient had his drain removed by vein and vascular on 1/25/. This was a large pocket of fluid in his left thigh that seem to tunnel into his left upper calf. 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 His mention his Penrose drain was removed today. He now has a tunneling wound on his left calf and left thigh. Both of these probe widely towards each other although I cannot really prove that they connect. Both wounds on his lower leg anteriorly are closed and his area over the first metatarsal head on his right foot continues to improve. We are using Hydrofera Blue here. He also saw infectious disease culture of the abscess they noted was polymicrobial with MRSA, Morganella and Klebsiella he was treated with doxycycline and ciprofloxacin for 4 weeks ending on 07/03/2021. They did not recommend any further antibiotics. Notable that while he still had the Penrose drain in place last week he had purulent drainage coming out of the inferior IandD site this grew [facility], MRSA and Pseudomonas but there does  not appear to be any active infection in this area today with the drain out and he is not systemically unwell 2/2; with regards to the drain sites the superior one on the thigh actually is closed down the one on the upper left lateral calf measures about 8 and half centimeters which is an improvement seems to be less prominent although still with a lot of drainage. The only remaining wound is over the first metatarsal head on the left foot and this looks to be continuing to improve with Hydrofera Blue. 2/9; the area on his plantar left foot continues to contract. Callus around the wound edge. The drain sites specifically have not come down in depth. We put the wound VAC on Monday he changed the canister late last night our intake nurse reported a pocket of fluid perhaps caused by our compression wraps 2/16; continued improvement in left foot plantar wound. drainage site in the calf is not improved in terms of depth (wound vac) 2/23; continued improvement in the left foot wound over the first metatarsal head. With regards to the drain sites the area on his thigh laterally is healed however the open area on his calf is small in terms of circumference by still probes in by about 15 cm. Within using the wound VAC. Hydrofera Blue on his foot 08/24/2021: The left first metatarsal head wound continues to improve. The wound bed is healthy with just some surrounding callus. Unfortunately the open drain site on his calf remains open and tunnels at least 15 cm (the extent of a Q-tip). This is despite several weeks of wound VAC treatment. Based on reading back through the notes, there has been really no significant change in the depth of the wound, although the orifice is smaller and the more cranial wound on his thigh has closed. I suspect the tunnel tracks nearly all the way to this location. 08/31/2021: Continued improvement in the left first metatarsal head wound. There has been absolutely no improvement to the  long tunnel from his open drain site on his calf. We have tried to get him into see vascular surgery sooner to consider the possibility of simply filleting the tract open and allowing it to heal from the bottom up, likely with a wound VAC. They have not yet scheduled a sooner appointment than his current mid April 09/14/2021: He was seen by vascular  surgery and they took him to the operating room last week. They opened a portion of the tunnel, but did not extend the entire length of the known open subcutaneous tract. I read Dr. Claretha Cooper operative note and it is not clear from that documentation why only a portion of the tract was opened. The heaped up granulation tissue was curetted and removed from at least some portion of the tract. They did place a wound VAC and applied an Unna boot to the leg. The ulcer on his left first metatarsal head is smaller today. The bed looks good and there is just a small amount of surrounding callus. 09/21/2021: The ulcer on his left first metatarsal head looks to be stalled. There is some callus surrounding the wound but the wound bed itself does not appear particularly dynamic. The tunnel tract on his lateral left leg seems to be roughly the same length or perhaps slightly smaller but the wound bed appears healthy with good granulation tissue. He opened up a new wound on his medial thigh and the site of a prior surgical incision. He says that he did this unconsciously in his sleep by scratching. 09/28/2021: Unfortunately, the ulcer on his left first metatarsal head has extended underneath the callus toward the dorsum of his foot. The medial thigh wounds are roughly the same. The tunnel on his lateral left leg continues to be problematic; it is longer than we are able to actually probe with a Q-tip. I am still not certain as to why Dr. Donzetta Matters did not open this up entirely when he took the patient to the operating room. We will likely be back in the same situation with just  a small superficial opening in a long unhealed tract, as the open portion is granulating in nicely. 10/02/2021: The patient was initially scheduled for a nurse visit, but we are also applying a total contact cast today. The plantar foot wound looks clean without significant accumulated callus. We have been applying Prisma silver collagen to the site. 10/05/2021: The patient is here for his first total contact cast change. We have tried using gauze packing strips in the tunnel on his lateral leg wound, but this does not seem to be working any better than the white VAC foam. The foot ulcer looks about the same with minimal periwound callus. Medial thigh wound is clean with just some overlying eschar. 10/12/2021: The plantar foot wound is stable without any significant accumulation of periwound callus. The surface is viable with good granulation tissue. The medial thigh wounds are much smaller and are epithelializing. On the other hand, he had purulent drainage coming from the tunnel on his lateral leg. He does go back to see Dr. Donzetta Matters next week and is planning to ask him why the wound tunnel was not completely opened at the time of his most recent operation. 10/19/2021: The plantar foot wound is markedly improved and has epithelial tissue coming through the surface. The medial thigh wounds are nearly closed with just a tiny open area. He did see Dr. Donzetta Matters earlier this week and apparently they did discuss the possibility of opening the sinus tract further and enabling a wound VAC application. Apparently there are some limits as to what Dr. 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 I think if we could get the tract open to the level of the popliteal fossa, this would greatly aid in her ability to get this chart closed. That being said, however, today when I  probed the tract with a Q-tip, I was not able to insert the entirety of the Q-tip as I have on previous occasions. The  tunnel is shorter by about 4 cm. The surface is clean with good granulation tissue and no further episodes of purulent drainage. 10/30/2021: Last week, the patient underwent surgery and had the long tract in his leg opened. There was a rind that was debrided, according to the operative report. His medial thigh ulcers are closed. The plantar foot wound is clean with a good surface and some built up surrounding callus. DALIAN, SMID ([id number]) [id number] Page 13 of 19 11/06/2021: The overall dimensions of the large wound on his lateral leg remain about the same, but there is good granulation tissue present and the tunneling is a little bit shorter. He has a new wound on his anterior tibial surface, in the same location where he had a similar lesion in the past. The plantar foot wound is clean with some buildup surrounding callus. Just toward the medial aspect of his foot, however, there is an area of darkening that once debrided, revealed another opening in the skin surface. 11/13/2021: The anterior tibial surface wound is closed. The plantar foot wound has some surrounding callus buildup. The area of darkening that I debrided last week and revealed an opening in the skin surface has closed again. The tunnel in the large wound on his lateral leg has come in by about 3 cm. There is healthy granulation tissue on the entire wound surface. 11/23/2021: The patient was out of town last week and did wet-to-dry dressings on his large wound. 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 Unfortunately, he picked open the wound on his left medial thigh. He says that it was itching and he just could not stop scratching it until it was open again. The wound on his plantar foot is smaller and has not accumulated a tremendous amount of callus. The lateral leg wound is shallower and the tunnel has also decreased in depth. There is  just a little bit of slough accumulation on the surface. 11/30/2021: Another portion of his left medial thigh has been opened up. All of these wounds are fairly superficial with just a little bit of slough and eschar accumulation. The wound on his plantar foot is almost closed with just a bit of eschar and periwound callus accumulation. The lateral leg wound is nearly flush with the surrounding skin and the tunnel is markedly shallower. 12/07/2021: There is just 1 open area on his left medial thigh. It is clean with just a little bit of perimeter eschar. The wound on his plantar foot continues to contract and just has some eschar and periwound callus accumulation. The lateral leg wound is closing at the more distal aspect and the tunnel is smaller. The surface is nearly flush with the surrounding skin and it has a good bed of granulation tissue. 12/14/2021: The thigh and foot wounds are closed. The lateral leg wound has closed over approximately half of its length. The tunnel continues to contract and the surface is now flush with the surrounding skin. The wound bed has robust granulation tissue. 12/22/2021: The thigh and foot wounds have reopened. The foot wound has a lot of callus accumulation around and over it. The thigh wound is tiny with just a little bit of slough in the wound bed. The lateral leg wound continues to contract. His vascular surgeon took  the wound VAC off earlier in the week and the patient has been doing wet-to-dry dressings. There is a little slough accumulation on the surface. The tunnel is about 3 cm in depth at this point. 12/28/2021: The thigh wound is closed again. The foot wound has some callus that subsequently has peeled back exposing just a small slit of a wound. The lateral leg wound Is down to about half the size that it originally was and the tunnel is down to about half a centimeter in depth. 01/04/2022: The thigh wound remains closed. The foot wound has heavy callus  overlying the wound site. Once this was debrided, the wound was found to be closed. The lateral leg wound is smaller again this week and very superficial. No tunnel could be identified. 01/12/2022: The thigh and foot wounds both remain closed. The lateral leg wound is now nearly flush with the skin surface. There is good granulation tissue present with a light layer of slough. 01/19/2022: Due to the way his wrap was placed, the patient did not change the dressing on his thigh at all and so the foam was saturated and his skin is macerated. There is a light layer of slough on the wound surface. The underlying granulation tissue is robust and healthy-appearing. He has heavy callus buildup at the site of his first metatarsal head wound which is still healed. 02/01/2022: He has been in silver alginate. When he removed the dressing from his thigh wound, however, some leg, superficially reopening a portion of the wound that had healed. In addition, underneath the callus at his left first metatarsal head, there appears to be a blister and the wound appears to be open again. 02/08/2022: The lateral leg wound has contracted substantially. There is eschar and a light layer of slough present. He says that it is starting to pull and is uncomfortable. On inspection, there is some puckering of the scar and the eschar is quite dry; this may account for his symptoms. On his first metatarsal head, the wound is much smaller with just some eschar on the surface. The callus has not reaccumulated. He reports that he had a blister come up on his medial thigh wound at the distal aspect. It popped and there is now an opening in his skin again. Looking back through his Jennings of wound photos, there is what looks like a permanent suture just deep to this location and it may be trying to erode through. We have been using silver alginate on his wounds. 02/15/2022: The lateral leg wound is about half the size it was last week. It is  clean with just a little perimeter eschar and light slough. The wound on his first metatarsal head is about the same with heavy callus overlying it. The medial thigh wound is closed again. He does have some skin changes on the top of his foot that looks potentially yeast related. 02/22/2022: The skin on the top of his foot improved with the use of a topical antifungal. The lateral leg wound continues to contract and is again smaller this week. There is a little bit of slough and eschar on the surface. The first metatarsal head wound is a little bit smaller but has reaccumulated a thick callus over the top. He decided to try to trim his toenail and ultimately took the entire nail off of his left great toe. 03/02/2022: His lateral leg wound continues to improve, as does the wound on his left great toe. Unfortunately, it appears that somehow  his foot got wet and moisture seeped in through the opening causing his skin to lift. There is a large wound now overlying his first metatarsal on both the plantar, medial, and dorsal portion of his foot. There is necrotic tissue and slough present underneath the shaggy macerated skin. 03/08/2022: The lateral leg wound is smaller again today. There is just a light layer of slough and eschar on the surface. The great toe wound is smaller again today. The first metatarsal wound is a little bit smaller today and does not look nearly as necrotic and macerated. There is still slough and nonviable tissue present. 03/15/2022: The lateral leg wound is narrower and just has a little bit of light slough buildup. The first metatarsal wound still has a fair amount of moisture affecting the periwound skin. The great toe wound is healed. 03/22/2022: The lateral leg wound is now isolated to just at the level of his knee. There is some eschar and slough accumulation. The first metatarsal head wound has epithelialized tremendously and is about half the size that it was last week. He still  has some maceration on the top of his foot and a fungal odor is present. 03/29/2022: T oday the patient's foot was macerated, suggesting that the cast got wet. The patient has also been picking at his dry skin and has enlarged the wound on his left lateral leg. In the time between having his cast removed and my evaluation, he had picked more dry skin and opened up additional wounds on his Achilles area and dorsal foot. The plantar first metatarsal head wound, however, is smaller and clean with just macerated callus around the perimeter and light slough on the surface. The lateral leg wound measured a little bit larger but is also fairly clean with eschar and minimal slough. 04/02/2022: The patient had vascular studies done last Friday and so his cast was not applied. He is here today to have that done. 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 04/05/2022: Both wounds are smaller and quite clean. There is just a little biofilm on the lateral leg wound. 10/20; the patient has a wound on the left lateral surgical incision at the level of his lateral knee this looks clean and improved. He is using silver alginate. He also has an area on his left medial foot for which she is using Hydrofera Blue under a total contact cast both wounds are measuring smaller 04/20/2022: The plantar foot wound has contracted considerably and is very close to closing. The lateral leg wound was measured a little larger, but there was a tiny open area that was included in the measurements that was not included last week. He has some eschar around the perimeter but otherwise the wound looks clean. DAVED, BAUERNFEIND ([id number]) [id number] Page 14 of 19 04/27/2022: The lateral leg wound looks better this week. He says that midweek, he felt it was very dry and began applying hydrogel to the site. I think this was beneficial. The foot wound is nearly closed underneath a thick layer of dry skin and  callus. 05/04/2022: The foot wound is healed. He has developed a new small ulcer on his anterior tibial surface about midway up his leg. It has a little slough on the surface. The lateral leg wound still is fairly dry, but clean with just a little biofilm on the surface. 05/11/2022: The wound on his foot reopened on Wednesday. A large blister formed which then broke open revealing the fat layer underneath.  The ulcer on his anterior tibial surface is a little bit larger this week. The lateral leg wound has much better moisture balance this week. Fortunately, prior to his foot wound reopening, he did get the cast made for his orthotic. 05/15/2022: Already, the left medial foot wound has improved. The tissue is less macerated and the surface is clean. The ulcer on his anterior tibial surface continues to enlarge. This seems likely secondary to accumulated moisture. The lateral leg wound continues to have an improved moisture balance with the use of collagen. 05/25/2022: The medial foot wound continues to contract. It is now substantially smaller with just a little slough on the surface. The anterior tibial surface wound continues to enlarge further. Once again, this seems to be secondary to moisture. The lateral leg wound does not seem to be changing much in size, but the moisture balance is better. 06/01/2022: The anterior tibial wound is closed. The medial foot wound is down to just a very small, couple of millimeters, opening. The lateral leg wound has good moisture balance, but remains unchanged in size. 12/15; the patient's anterior tibial wound has reopened, however the area on his right first metatarsal head is closed. The major wound is actually on the superior part of his surgical wound in the left lateral thigh. Not a completely viable surface under illumination. This may at some point require a debridement I think he is currently using Prisma. As noted the left medial foot wound has  closed 06/14/2022: The anterior tibial wound has closed. The lateral leg wound has a better surface but is basically unchanged in size. The left medial foot wound has reopened. It looks as though there was some callus accumulation and moisture got under the callus which caused the tissue to break down again. 06/21/2022: A new wound has opened up just distal to the previous anterior tibial wound. It is small but has hypertrophic granulation tissue present. The lateral leg wound is a little bit narrower and has a layer of slough on the surface. The left medial foot wound is down to just a pinhole. His custom orthotics should be available next week. 06/28/2022: The wound on his first metatarsal head has healed. He has developed a new small wound on his medial lower leg, in an old scar site. The lateral leg wound continues to contract but continues to accumulate slough, as well. 07/03/2022: Despite wearing his custom orthopedic shoes, he managed to reopen the wound on his first metatarsal head. He says he thinks his foot got wet and then some skin lifted up and he peeled this away. Both of the lower leg wounds are smaller and have some dry eschar on the surface. The lateral leg wound is quite a bit narrower today. 07/12/2022: The medial lower leg wound is closed. The anterior lower leg wound has contracted considerably. The lateral upper leg wound is narrower with a layer of slough on the surface. The first metatarsal head wound is also smaller, but had copious drainage which saturated the foam border dressing and resulted in some periwound tissue maceration. Fortunately there was no breakdown at this site. 07/19/2022: The lower leg shows signs of significant maceration; I think he must be sweating excessively inside his cast. There are several areas of skin breakdown present. The wound on his foot is smaller and that on his lateral leg is narrower and is shorter by about a centimeter. 07/26/2022: Last week we  used a zinc Coflex wrap prior to applying his total contact  cast and this has had the effect of keeping his skin from getting macerated this week. The anterior leg wound has epithelialized substantially. The lateral leg wound is significantly smaller with just a bit of slough on the surface. The first metatarsal head wound is also smaller this week. 08/02/2022: The anterior leg wound was closed on arrival, but while he was sitting in the room, he picked it open again. The lateral leg wound is smaller with just a little slough on the surface and the first metatarsal head wound has contracted further, as well. 08/09/2022: The first metatarsal head wound is covered with callus. Underneath the callus, it is nearly completely closed. The lateral leg wound is smaller again this week. The anterior leg wound looks better, but he has such heavy buildup of old skin, that moisture is getting underneath this, becoming trapped, and causing the underlying skin to get macerated and open up. 08/16/2022: The first metatarsal head wound is closed. The lateral leg wound continues to contract and is quite a bit smaller again this week. There is just a small, superficial opening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

## 2022-08-23 ENCOUNTER — Encounter (HOSPITAL_BASED_OUTPATIENT_CLINIC_OR_DEPARTMENT_OTHER): Payer: Medicare Other | Admitting: General Surgery

## 2022-08-23 DIAGNOSIS — E11621 Type 2 diabetes mellitus with foot ulcer: Secondary | ICD-10-CM | POA: Diagnosis not present

## 2022-08-25 NOTE — Progress Notes (Signed)
[name], [name] ([id number]) [id number] Page 1 of 19 Visit Report for 08/23/2022 Chief Complaint Document Details Patient Name: Date of Service: [name], [name] 08/23/2022 10:45 A M Medical Record Number: [id number] Patient Account Number: [id number] Date of Birth/Sex: Treating RN: September 16, 1950 (72 y.o. M) Primary Care Provider: [name] Other Clinician: Referring Provider: Treating Provider/Extender: [name] in Treatment: 56 Information Obtained from: Patient Chief Complaint Left leg and foot ulcers 04/12/2021; patient is here for wounds on his left lower leg and left plantar foot over the first metatarsal head Electronic Signature(s) Signed: 08/23/2022 11:58:40 AM By: Fredirick Maudlin MD FACS Entered By: Fredirick Maudlin on 08/23/2022 11:58:40 -------------------------------------------------------------------------------- Debridement Details Patient Name: Date of Service: [name]. 08/23/2022 10:45 A M Medical Record Number: [id number] Patient Account Number: [id number] Date of Birth/Sex: Treating RN: [birth year] (72 y.o. [name] Primary Care Provider: [name] Other Clinician: Referring Provider: Treating Provider/Extender: [name] in Treatment: 71 Debridement Performed for Assessment: Wound #22 Left,Proximal,Lateral Lower Leg Performed By: Physician Fredirick Maudlin, MD Debridement Type: Debridement Level of Consciousness (Pre-procedure): Awake and Alert Pre-procedure Verification/Time Out Yes - 11:16 Taken: Start Time: 11:17 Pain Control: Lidocaine 4% T opical Solution T Area Debrided (L x W): otal 1.4 (cm) x 0.5 (cm) = 0.7 (cm) Tissue and other material debrided: Non-Viable, Eschar, Slough, Slough Level: Non-Viable Tissue Debridement Description: Selective/Open Wound Instrument: Curette Bleeding: Minimum Hemostasis Achieved: Pressure Response to Treatment: Procedure  was tolerated well Level of Consciousness (Post- Awake and Alert procedure): Post Debridement Measurements of Total Wound Length: (cm) 1.4 Width: (cm) 0.5 Depth: (cm) 0.1 Volume: (cm) 0.055 Character of Wound/Ulcer Post Debridement: Requires Further Debridement Post Procedure Diagnosis Same as Pre-procedure Notes Scribed for Dr. Celine Ahr by Blanche East, RN Electronic Signature(s) Signed: 08/23/2022 12:28:35 PM By: Fredirick Maudlin MD FACS Signed: 08/23/2022 4:27:41 PM By: Blanche East RN Entered By: Blanche East on 08/23/2022 11:19:55 [name] ([id number]) [id number] Page 2 of 19 -------------------------------------------------------------------------------- HPI Details Patient Name: Date of Service: [name], [name] 08/23/2022 10:45 A M Medical Record Number: [id number] Patient Account Number: [id number] Date of Birth/Sex: Treating RN: 1950-11-15 (72 y.o. M) Primary Care Provider: [name] Other Clinician: Referring Provider: Treating Provider/Extender: [name] in Treatment: 49 History of Present Illness HPI Description: 10/11/17; [name] is a 72 year old man who tells me that in 2015 he slipped down the latter traumatizing his left leg. He developed a wound in the same spot the area that we are currently looking at. He states this closed over for the most part although he always felt it was somewhat unstable. In 2016 he hit the same area with the door of his car had this reopened. He tells me that this is never really closed although sometimes an inflow it remains open on a constant basis. He has not been using any specific dressing to this except for topical antibiotics the nature of which were not really sure. His primary doctor did send him to see Dr. [name] of interventional cardiology. He underwent an angiogram on 08/06/17 and he underwent a PTA and directional atherectomy of the lesser distal SFA and popliteal  arteries which resulted in brisk improvement in blood flow. It was noted that he had 2 vessel runoff through the anterior tibial and peroneal. He is also been to see vascular and interventional radiologist. He was not felt to have any significant superficial venous insufficiency. Presumably is not a candidate for any ablation.  It was suggested he come here for wound care. The patient is a type II diabetic on insulin. He also has a history of venous insufficiency. ABIs on the left were noncompressible in our clinic 10/21/17; patient we admitted to the clinic last week. 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 We put Iodosorb on him after an aggressive debridement and 3 layer compression. He complained of pain in his ankle and itching with is skin in fact he scratched the area on the medial calf superiorly at the rim of our wraps and he has 2 small open areas in that location today which are new. I changed his primary dressing today to silver collagen. As noted he is already had revascularization and does not have any significant superficial venous insufficiency that would be amenable to ablation 10/28/17; patient admitted to the clinic 2 weeks ago. He has a smaller Wound. Scratch injury from last week revealed. There is large wound over the tibial area. This is smaller. Granulation looks healthy. No need for debridement. 11/04/17; the wound on the left lateral calf looks better. Improved dimensions. Surface of this looks better. We've been maintaining him and Kerlix Coban wraps. He finds this much more comfortable. Silver collagen dressing 11/11/17; left lateral Wound continues to look healthy be making progress. Using a #5 curet I removed removed nonviable skin from the surface of the wound and then necrotic debris from the wound surface. Surface of the wound continues to look healthy. He also has an open area on the left great toenail  bed. We've been using topical antibiotics. 11/19/17; left anterior lateral wound continues to look healthy but it's not closed. He also had a small wound above this on the left leg Initially traumatic wounds in the setting of significant chronic venous insufficiency and stasis dermatitis 11/25/17; left anterior wounds superiorly is closed still a small wound inferiorly. 12/02/17; left anterior tibial area. Arrives today with adherent callus. Post debridement clearly not completely closed. Hydrofera Blue under 3 layer compression. 12/09/17; left anterior tibia. Circumferential eschar however the wound bed looks stable to improved. We've been using Hydrofera Blue under 3 layer compression 12/17/17; left anterior tibia. Apparently this was felt to be closed however when the wrap was taken off there is a skin tear to reopen wounds in the same area we've been using Hydrofera Blue under 3 layer compression 12/23/17 left anterior tibia. Not close to close this week apparently the [facility] was stuck to this again. Still circumferential eschar requiring debridement. I put a contact layer on this this time under the Hydrofera Blue 12/31/17; left anterior tibia. Wound is better slight amount of hyper-granulation. Using Hydrofera Blue over Adaptic. 01/07/18; left anterior tibia. The wound had some surface eschar however after this was removed he has no open wound.he was already revascularized by Dr. [name] when he came to our clinic with atherectomy of the left SFA and popliteal artery. He was also sent to interventional radiology for venous reflux studies. He was not felt to have significant reflux but certainly has chronic venous changes of his skin with hemosiderin deposition around this area. He will definitely need to lubricate his skin and wear compression stocking and I've talked to him about this. READMISSION 05/26/2018 This is a now 72 year old man we cared for with traumatic wounds on his left anterior  lower extremity. He had been previously revascularized during that admission by Dr. [name]. Apparently in follow-up Dr.  Ganji noted that he had deterioration in his arterial status. He underwent a stent placement in the distal left SFA on 04/22/2018. 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 He went back to the angiography suite on 04/30/2018 he underwent PTA and balloon angioplasty of the occluded left mid anterior tibial artery, thrombotic occlusion went from 100 to 0% which reconstitutes the posterior tibial artery. He had thrombectomy and aspiration of the peroneal artery. The stent placed in the distal SFA left SFA was still occluded. 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 He has been recuperating at [place] home in [facility] after the most recent hospitalization. He is going home tomorrow. He tells me that 2 weeks ago he traumatized the tip of his left fifth toe. He came in urgently for our review of this. This was a history of before I noted that Dr. [name] had already noted dry gangrenous changes of the left fifth toe 06/09/2018; 2-week follow-up. I did contact Dr. [name] after his last appointment and he apparently saw 1 of Dr. Irven Shelling colleagues the next day. He does not follow-up with Dr. [name] himself until Thursday of this week. 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 Nevertheless there is no evidence of infection no drainage and no pain. He had a new area that this week when we were signing him in today on the left anterior mid tibia area, this is in close proximity to the previous wound we have dealt with in this clinic. 06/23/2018; 2-week follow-up. I did not receive a recent note from Dr.  [name] to review today. Our office is trying to obtain this. He is apparently not planning to do further vascular interventions and wondered about compression to try and help with the patient's chronic venous insufficiency. However we are also concerned about the arterial flow. He arrives in clinic today with a new area on the left third toe. The areas on the calf/anterior tibia are close to closing. The left fifth toe is still mummified using Betadine. -In reviewing things with the patient he has what sounds like claudication with mild to moderate amount of activity. 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 I prescribed Levaquin over the phone while we attempted to arrange a plan of care. However the patient called yesterday to report he had low-grade fever and he came in today acutely. There is been a marked deterioration in the left third toe with spreading cellulitis up into the dorsal left foot. He was referred to the emergency room. Readmission: [name], [name] ([id number]) [id number] Page 3 of 19 06/29/2020 patient presents today for reevaluation here in our clinic he was previously treated by Dr. Dellia Nims at the latter part of 2019 in 2 the beginning of 2020. Subsequently we have not seen him since that time in the interim he did have evaluation with vein and vascular specialist specifically Dr. Anice Paganini who did perform quite extensive work for a left femoral to anterior tibial artery bypass. With that being said in the interim the patient has developed significant lymphedema and has wounds that he tells me have really never healed in regard to the incision site on the left leg.  He also has multiple wounds on the feet for various reasons some of which is that he tends to pick at his feet. Fortunately there is no signs of active infection systemically at this time he does have some wounds that are little bit deeper but most are fairly  superficial he seems to have good blood flow and overall everything appears to be healthy I see no bone exposed and no obvious signs of osteomyelitis. I do not know that he necessarily needs a x-ray at this point although that something we could consider depending on how things progress. The patient does have a history of lymphedema, diabetes, this is type II, chronic kidney disease stage III, hypertension, and history of peripheral vascular disease. 07/05/2020; patient admitted last week. Is a patient I remember from 2019 he had a spreading infection involving the left foot and we sent him to the hospital. 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 He subsequently had a left femoral to anterior tibial bypass by Dr.Cain vein and vascular. He also has severe lymphedema with chronic skin changes related to that on the left leg. The most problematic area that was new today was on the left medial great toe. This was apparently a small area last week there was purulent drainage which our intake nurse cultured. Also areas on the left medial foot and heel left lateral foot. He has 2 areas on the left medial calf left lateral calf in the setting of the severe lymphedema. 07/13/2020 on evaluation today patient appears to be doing better in my opinion compared to his last visit. The good news is there is no signs of active infection systemically and locally I do not see any signs of infection either. He did have an x-ray which was negative that is great news he had a culture which showed MRSA but at the same time he is been on the doxycycline which has helped. I do think we may want to extend this for 7 additional days 1/25; patient admitted to the clinic a few weeks ago. 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 We have been putting him under compression his edema control is a lot better but he is severe verricused skin on the  left leg. He is really done quite well he still has an open area on the left medial calf and the left medial first metatarsal head. We have been using silver collagen on the leg silver alginate on the foot 07/27/2020 upon evaluation today patient appears to be doing decently well in regard to his wounds. He still has a lot of dry skin on the left leg. Some of this is starting to peel back and I think he may be able to have them out by removing some that today. Fortunately there is no signs of active infection at this time on the left leg although on the right leg he does appear to have swelling and erythema as well as some mild warmth to touch. This does have been concerned about the possibility of cellulitis although within the differential diagnosis I do think that potentially a DVT has to be at least considered. We need to rule that out before proceeding would just call in the cellulitis. Especially since he is having pain in the posterior aspect of his calf muscle. 2/8; the patient had seen sparingly. He has severe skin changes of chronic lymphedema in the left leg thickened  hyperkeratotic verrucous skin. He has an open wound on the medial part of the left first met head left mid tibia. He also has a rim of nonepithelialized skin in the anterior mid tibia. He brought in the AmLactin lotion that was been prescribed although I am not sure under compression and its utility. There concern about cellulitis on the right lower leg the last time he was here. He was put on on antibiotics. His DVT rule out was negative. The right leg looks fine he is using his stocking on this area 08/10/2020 upon evaluation today patient appears to be doing well with regard to his leg currently. He has been tolerating the dressing changes without complication. Fortunately there is no signs of active infection which is great news. Overall very pleased with where things stand. 2/22; the patient still has an area on the medial  part of the left first met his head. This looks better than when I last saw this earlier this month he has a rim of epithelialization but still some surface debris. Mostly everything on the left leg is healed. There is still a vulnerable in the left mid tibia area. 08/30/2020 upon evaluation today patient appears to be doing much better in regard to his wounds on his foot. 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 Nonetheless I think we will address that today I Minna send in a prescription for him in that regard. Overall though there does not appear to be any signs of significant worsening. 09/07/2020 on evaluation today patient's wounds over his left foot appear to be doing excellent. I do not see any signs of infection there is some callus buildup this can require debridement for certain but overall I feel like he is managing quite nicely. He still using the AmLactin cream which has been beneficial for him as well. 3/22; left foot wound is closed. There is no open area here. He is using ammonium lactate lotion to the lower extremities to help exfoliate dry cracked skin. He has compression stockings from elastic therapy in [place]. The wound on the medial part of his left first met head is healed today. READMISSION 04/12/2021 Mr. Pommier is a patient we know fairly well he had a prolonged stay in clinic in 2019 with wounds on his left lateral and left anterior lower extremity in the setting of chronic venous insufficiency. More recently he was here earlier this year with predominantly an area on his left foot first metatarsal head plantar and he says the plantar foot broke down on its not long after we discharged him but he did not come back here. The last few months areas of broken down on his left anterior and again the left lateral lower extremity. The leg itself  is very swollen chronically enlarged a lot of hyperkeratotic dry Berry Q skin in the left lower leg. His edema extends well into the thigh. He was seen by Dr. Donzetta Matters. He had ABIs on 03/02/2021 showing an ABI on the right of 1 with a TBI of 0.72 his ABI in the left at 1.09 TBI of 0.99. Monophasic and biphasic waveforms on the right. On the left monophasic waveforms were noted he went on to have an angiogram on 03/27/2021 this showed the aortic aortic and iliac segments were free of flow-limiting stenosis the left common femoral vein to evaluate the left femoral  to anterior tibial artery bypass was unobstructed the bypass was patent without any areas of stenosis. We discharged the patient in bilateral juxta lite stockings but very clearly that was not sufficient to control the swelling and maintain skin integrity. He is clearly going to need compression pumps. 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 This is fortunate because he is on his feet for long periods of time. 10/27; patient comes in with our intake nurse reporting copious amount of green drainage from the left anterior mid tibia the left dorsal foot and to a lesser extent the left medial mid tibia. We left the compression wrap on all week for the amount of edema in his left leg is quite a bit better. We use silver alginate as the primary dressing 11/3; edema control is good. Left anterior lower leg left medial lower leg and the plantar first metatarsal head. The left anterior lower leg required debridement. Deep tissue culture I did of this wound showed MRSA I put him on 10 days of doxycycline which she will start today. We have him in compression wraps. He has a security card and [employer] however he is retiring on November 15. We will need to then get him into a better offloading boot for the left foot perhaps a total contact cast 11/10; edema control is quite good. Left anterior and left medial  lower leg wounds in the setting of chronic venous insufficiency and lymphedema. He also has a substantial area over the left plantar first metatarsal head. 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 He has significant hypergranulation on the left plantar foot wound. The patient is a diabetic but he does not have significant PAD 11/17; edema control is quite good. Left anterior and left medial lower leg wounds look better. The really concerning area remains the area on the left plantar first metatarsal head. He has a rim of epithelialization. 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 Starting today with a forefoot off loader but . possibly a total contact cast. 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 Arrives with a very significant odor from I think all of the wounds on the left leg and the left foot. Because of this I did not put a total contact cast on him today but will could still consider this. His wife was having cataract surgery which is the reason he missed the appointment 12/6. I saw this man 5 days ago with a swelling below the popliteal fossa. I thought he actually might have a Baker's cyst however the DVT rule out study that we could arrange right away was negative the technician told me this was not a ruptured Baker's cyst. We attempted to get this aspirated by under ultrasound ROI, [facility] ([id number]) [id number] Page 4 of 19 guidance in interventional radiology however all they did was an ultrasound however it shows an extensive fluid collection 62 x 8 x 9.4 in the left thigh and left calf. The patient states he thinks this started 8 days ago  or so but he really is  not complaining of any pain, fever or systemic symptoms. He has not ha 12/20; after some difficulty I managed to get the patient into see Dr. Donzetta Matters. Eventually he was taken into the hospital and had a drain put in the fluid collection below his left knee posteriorly extending into the posterior thigh. He still has the drain in place. Culture of this showed moderate staff aureus few Morganella and few Klebsiella he is now on doxycycline and ciprofloxacin as suggested by infectious disease he is on this for a month. The drain will remain in place until it stops draining 12/29; he comes in today with the 1 wound on his left leg and the area on the left plantar first met head significantly smaller. Both look healthy. He still has the drain in the left leg. He says he has to change this daily. Follows up with Dr. Donzetta Matters on January 11. 06/29/2021; the wounds that I am following on the left leg and left first met head continued to be quite healthy. However the area where his inferior drain is in place had copious amounts of drainage which was green in color. The wound here is larger. Follows up with Dr. Gwenlyn Saran of vein and vascular his surgeon next week as well as infectious disease. He remains on ciprofloxacin and doxycycline. He is not complaining of excessive pain in either one of the drain areas 1/12; the patient saw vascular surgery and infectious disease. Vascular surgery has left the drain in place as there was still some notable drainage still see him back in 2 weeks. Dr. Velna Ochs stop the doxycycline and ciprofloxacin and I do not believe he follows up with them at this point. Culture I did last week showed both doxycycline resistant MRSA and Pseudomonas not sensitive to ciprofloxacin although only in rare titers 1/19; the patient's wound on the left anterior lower leg is just about healed. We have continued healing of the area that was medially on the left leg. Left first plantar  metatarsal head continues to get smaller. The major problem here is his 2 drain sites 1 on the left upper calf and lateral thigh. There is purulent drainage still from the left lateral thigh. I gave him antibiotics last week but we still have recultured. He has the drain in the area I think this is eventually going to have to come out. I suspect there will be a connecting wound to heal here perhaps with improved VAc 1/26; the patient had his drain removed by vein and vascular on 1/25/. This was a large pocket of fluid in his left thigh that seem to tunnel into his left upper calf. 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 His mention his Penrose drain was removed today. He now has a tunneling wound on his left calf and left thigh. Both of these probe widely towards each other although I cannot really prove that they connect. Both wounds on his lower leg anteriorly are closed and his area over the first metatarsal head on his right foot continues to improve. We are using Hydrofera Blue here. He also saw infectious disease culture of the abscess they noted was polymicrobial with MRSA, Morganella and Klebsiella he was treated with doxycycline and ciprofloxacin for 4 weeks ending on 07/03/2021. They did not recommend any further antibiotics. Notable that while he still had the Penrose drain in place last week he had purulent drainage coming out of the inferior IandD site this grew [facility], MRSA and Pseudomonas  but there does not appear to be any active infection in this area today with the drain out and he is not systemically unwell 2/2; with regards to the drain sites the superior one on the thigh actually is closed down the one on the upper left lateral calf measures about 8 and half centimeters which is an improvement seems to be less prominent although still with a lot of drainage. The only remaining wound is over the first metatarsal head on the left foot and this looks to be  continuing to improve with Hydrofera Blue. 2/9; the area on his plantar left foot continues to contract. Callus around the wound edge. The drain sites specifically have not come down in depth. We put the wound VAC on Monday he changed the canister late last night our intake nurse reported a pocket of fluid perhaps caused by our compression wraps 2/16; continued improvement in left foot plantar wound. drainage site in the calf is not improved in terms of depth (wound vac) 2/23; continued improvement in the left foot wound over the first metatarsal head. With regards to the drain sites the area on his thigh laterally is healed however the open area on his calf is small in terms of circumference by still probes in by about 15 cm. Within using the wound VAC. Hydrofera Blue on his foot 08/24/2021: The left first metatarsal head wound continues to improve. The wound bed is healthy with just some surrounding callus. Unfortunately the open drain site on his calf remains open and tunnels at least 15 cm (the extent of a Q-tip). This is despite several weeks of wound VAC treatment. Based on reading back through the notes, there has been really no significant change in the depth of the wound, although the orifice is smaller and the more cranial wound on his thigh has closed. I suspect the tunnel tracks nearly all the way to this location. 08/31/2021: Continued improvement in the left first metatarsal head wound. There has been absolutely no improvement to the long tunnel from his open drain site on his calf. We have tried to get him into see vascular surgery sooner to consider the possibility of simply filleting the tract open and allowing it to heal from the bottom up, likely with a wound VAC. They have not yet scheduled a sooner appointment than his current mid April 09/14/2021: He was seen by vascular surgery and they took him to the operating room last week. They opened a portion of the tunnel, but did not extend  the entire length of the known open subcutaneous tract. I read Dr. Claretha Cooper operative note and it is not clear from that documentation why only a portion of the tract was opened. The heaped up granulation tissue was curetted and removed from at least some portion of the tract. They did place a wound VAC and applied an Unna boot to the leg. The ulcer on his left first metatarsal head is smaller today. The bed looks good and there is just a small amount of surrounding callus. 09/21/2021: The ulcer on his left first metatarsal head looks to be stalled. There is some callus surrounding the wound but the wound bed itself does not appear particularly dynamic. The tunnel tract on his lateral left leg seems to be roughly the same length or perhaps slightly smaller but the wound bed appears healthy with good granulation tissue. He opened up a new wound on his medial thigh and the site of a prior surgical incision. He says  that he did this unconsciously in his sleep by scratching. 09/28/2021: Unfortunately, the ulcer on his left first metatarsal head has extended underneath the callus toward the dorsum of his foot. The medial thigh wounds are roughly the same. The tunnel on his lateral left leg continues to be problematic; it is longer than we are able to actually probe with a Q-tip. I am still not certain as to why Dr. Donzetta Matters did not open this up entirely when he took the patient to the operating room. We will likely be back in the same situation with just a small superficial opening in a long unhealed tract, as the open portion is granulating in nicely. 10/02/2021: The patient was initially scheduled for a nurse visit, but we are also applying a total contact cast today. The plantar foot wound looks clean without significant accumulated callus. We have been applying Prisma silver collagen to the site. 10/05/2021: The patient is here for his first total contact cast change. We have tried using gauze packing strips in  the tunnel on his lateral leg wound, but this does not seem to be working any better than the white VAC foam. The foot ulcer looks about the same with minimal periwound callus. Medial thigh wound is clean with just some overlying eschar. 10/12/2021: The plantar foot wound is stable without any significant accumulation of periwound callus. The surface is viable with good granulation tissue. The medial thigh wounds are much smaller and are epithelializing. On the other hand, he had purulent drainage coming from the tunnel on his lateral leg. He does go back to see Dr. Donzetta Matters next week and is planning to ask him why the wound tunnel was not completely opened at the time of his most recent operation. 10/19/2021: The plantar foot wound is markedly improved and has epithelial tissue coming through the surface. The medial thigh wounds are nearly closed with just a tiny open area. He did see Dr. Donzetta Matters earlier this week and apparently they did discuss the possibility of opening the sinus tract further and enabling a wound VAC application. Apparently there are some limits as to what Dr. 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 I think if we could get the tract open to the level of the popliteal fossa, this would greatly aid in her ability to get this chart closed. That being said, however, today when I probed the tract with a Q-tip, I was not able to insert the entirety of the Q-tip as I have on previous occasions. The tunnel is shorter by about 4 cm. The surface is clean with good granulation tissue and no further episodes of purulent drainage. [name], [name] ([id number]) [id number] Page 5 of 19 10/30/2021: Last week, the patient underwent surgery and had the long tract in his leg opened. There was a rind that was debrided, according to the operative report. His medial thigh ulcers are closed. The plantar foot wound is clean with a good surface and  some built up surrounding callus. 11/06/2021: The overall dimensions of the large wound on his lateral leg remain about the same, but there is good granulation tissue present and the tunneling is a little bit shorter. He has a new wound on his anterior tibial surface, in the same location where he had a similar lesion in the past. The plantar foot wound is clean with some buildup surrounding callus. Just toward the medial aspect of his foot, however, there is an area of darkening that once debrided,  revealed another opening in the skin surface. 11/13/2021: The anterior tibial surface wound is closed. The plantar foot wound has some surrounding callus buildup. The area of darkening that I debrided last week and revealed an opening in the skin surface has closed again. The tunnel in the large wound on his lateral leg has come in by about 3 cm. There is healthy granulation tissue on the entire wound surface. 11/23/2021: The patient was out of town last week and did wet-to-dry dressings on his large wound. 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 Unfortunately, he picked open the wound on his left medial thigh. He says that it was itching and he just could not stop scratching it until it was open again. The wound on his plantar foot is smaller and has not accumulated a tremendous amount of callus. The lateral leg wound is shallower and the tunnel has also decreased in depth. There is just a little bit of slough accumulation on the surface. 11/30/2021: Another portion of his left medial thigh has been opened up. All of these wounds are fairly superficial with just a little bit of slough and eschar accumulation. The wound on his plantar foot is almost closed with just a bit of eschar and periwound callus accumulation. The lateral leg wound is nearly flush with the surrounding skin and the tunnel is markedly shallower. 12/07/2021: There is just 1  open area on his left medial thigh. It is clean with just a little bit of perimeter eschar. The wound on his plantar foot continues to contract and just has some eschar and periwound callus accumulation. The lateral leg wound is closing at the more distal aspect and the tunnel is smaller. The surface is nearly flush with the surrounding skin and it has a good bed of granulation tissue. 12/14/2021: The thigh and foot wounds are closed. The lateral leg wound has closed over approximately half of its length. The tunnel continues to contract and the surface is now flush with the surrounding skin. The wound bed has robust granulation tissue. 12/22/2021: The thigh and foot wounds have reopened. The foot wound has a lot of callus accumulation around and over it. The thigh wound is tiny with just a little bit of slough in the wound bed. The lateral leg wound continues to contract. His vascular surgeon took the wound VAC off earlier in the week and the patient has been doing wet-to-dry dressings. There is a little slough accumulation on the surface. The tunnel is about 3 cm in depth at this point. 12/28/2021: The thigh wound is closed again. The foot wound has some callus that subsequently has peeled back exposing just a small slit of a wound. The lateral leg wound Is down to about half the size that it originally was and the tunnel is down to about half a centimeter in depth. 01/04/2022: The thigh wound remains closed. The foot wound has heavy callus overlying the wound site. Once this was debrided, the wound was found to be closed. The lateral leg wound is smaller again this week and very superficial. No tunnel could be identified. 01/12/2022: The thigh and foot wounds both remain closed. The lateral leg wound is now nearly flush with the skin surface. There is good granulation tissue present with a light layer of slough. 01/19/2022: Due to the way his wrap was placed, the patient did not change the dressing on his  thigh at all and  so the foam was saturated and his skin is macerated. There is a light layer of slough on the wound surface. The underlying granulation tissue is robust and healthy-appearing. He has heavy callus buildup at the site of his first metatarsal head wound which is still healed. 02/01/2022: He has been in silver alginate. When he removed the dressing from his thigh wound, however, some leg, superficially reopening a portion of the wound that had healed. In addition, underneath the callus at his left first metatarsal head, there appears to be a blister and the wound appears to be open again. 02/08/2022: The lateral leg wound has contracted substantially. There is eschar and a light layer of slough present. He says that it is starting to pull and is uncomfortable. On inspection, there is some puckering of the scar and the eschar is quite dry; this may account for his symptoms. On his first metatarsal head, the wound is much smaller with just some eschar on the surface. The callus has not reaccumulated. He reports that he had a blister come up on his medial thigh wound at the distal aspect. It popped and there is now an opening in his skin again. Looking back through his [facility] of wound photos, there is what looks like a permanent suture just deep to this location and it may be trying to erode through. We have been using silver alginate on his wounds. 02/15/2022: The lateral leg wound is about half the size it was last week. It is clean with just a little perimeter eschar and light slough. The wound on his first metatarsal head is about the same with heavy callus overlying it. The medial thigh wound is closed again. He does have some skin changes on the top of his foot that looks potentially yeast related. 02/22/2022: The skin on the top of his foot improved with the use of a topical antifungal. The lateral leg wound continues to contract and is again smaller this week. There is a little bit of  slough and eschar on the surface. The first metatarsal head wound is a little bit smaller but has reaccumulated a thick callus over the top. He decided to try to trim his toenail and ultimately took the entire nail off of his left great toe. 03/02/2022: His lateral leg wound continues to improve, as does the wound on his left great toe. Unfortunately, it appears that somehow his foot got wet and moisture seeped in through the opening causing his skin to lift. There is a large wound now overlying his first metatarsal on both the plantar, medial, and dorsal portion of his foot. There is necrotic tissue and slough present underneath the shaggy macerated skin. 03/08/2022: The lateral leg wound is smaller again today. There is just a light layer of slough and eschar on the surface. The great toe wound is smaller again today. The first metatarsal wound is a little bit smaller today and does not look nearly as necrotic and macerated. There is still slough and nonviable tissue present. 03/15/2022: The lateral leg wound is narrower and just has a little bit of light slough buildup. The first metatarsal wound still has a fair amount of moisture affecting the periwound skin. The great toe wound is healed. 03/22/2022: The lateral leg wound is now isolated to just at the level of his knee. There is some eschar and slough accumulation. The first metatarsal head wound has epithelialized tremendously and is about half the size that it was last week. He still has  some maceration on the top of his foot and a fungal odor is present. 03/29/2022: T oday the patient's foot was macerated, suggesting that the cast got wet. The patient has also been picking at his dry skin and has enlarged the wound on his left lateral leg. In the time between having his cast removed and my evaluation, he had picked more dry skin and opened up additional wounds on his Achilles area and dorsal foot. The plantar first metatarsal head wound, however,  is smaller and clean with just macerated callus around the perimeter and light slough on the surface. The lateral leg wound measured a little bit larger but is also fairly clean with eschar and minimal slough. 04/02/2022: The patient had vascular studies done last Friday and so his cast was not applied. He is here today to have that done. 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 04/05/2022: Both wounds are smaller and quite clean. There is just a little biofilm on the lateral leg wound. 10/20; the patient has a wound on the left lateral surgical incision at the level of his lateral knee this looks clean and improved. He is using silver alginate. He also has an area on his left medial foot for which she is using Hydrofera Blue under a total contact cast both wounds are measuring smaller 04/20/2022: The plantar foot wound has contracted considerably and is very close to closing. The lateral leg wound was measured a little larger, but there was a [name], [name] ([id number]) [id number] Page 6 of 19 tiny open area that was included in the measurements that was not included last week. He has some eschar around the perimeter but otherwise the wound looks clean. 04/27/2022: The lateral leg wound looks better this week. He says that midweek, he felt it was very dry and began applying hydrogel to the site. I think this was beneficial. The foot wound is nearly closed underneath a thick layer of dry skin and callus. 05/04/2022: The foot wound is healed. He has developed a new small ulcer on his anterior tibial surface about midway up his leg. It has a little slough on the surface. The lateral leg wound still is fairly dry, but clean with just a little biofilm on the surface. 05/11/2022: The wound on his foot reopened on Wednesday. A large blister formed which then broke open revealing the fat layer underneath. The ulcer on his anterior tibial surface is a little bit larger  this week. The lateral leg wound has much better moisture balance this week. Fortunately, prior to his foot wound reopening, he did get the cast made for his orthotic. 05/15/2022: Already, the left medial foot wound has improved. The tissue is less macerated and the surface is clean. The ulcer on his anterior tibial surface continues to enlarge. This seems likely secondary to accumulated moisture. The lateral leg wound continues to have an improved moisture balance with the use of collagen. 05/25/2022: The medial foot wound continues to contract. It is now substantially smaller with just a little slough on the surface. The anterior tibial surface wound continues to enlarge further. Once again, this seems to be secondary to moisture. The lateral leg wound does not seem to be changing much in size, but the moisture balance is better. 06/01/2022: The anterior tibial wound is closed. The medial foot wound is down to just a very small, couple of millimeters, opening. The lateral leg wound has good moisture balance, but remains unchanged in size. 12/15; the patient's  anterior tibial wound has reopened, however the area on his right first metatarsal head is closed. The major wound is actually on the superior part of his surgical wound in the left lateral thigh. Not a completely viable surface under illumination. This may at some point require a debridement I think he is currently using Prisma. As noted the left medial foot wound has closed 06/14/2022: The anterior tibial wound has closed. The lateral leg wound has a better surface but is basically unchanged in size. The left medial foot wound has reopened. It looks as though there was some callus accumulation and moisture got under the callus which caused the tissue to break down again. 06/21/2022: A new wound has opened up just distal to the previous anterior tibial wound. It is small but has hypertrophic granulation tissue present. The lateral leg wound is  a little bit narrower and has a layer of slough on the surface. The left medial foot wound is down to just a pinhole. His custom orthotics should be available next week. 06/28/2022: The wound on his first metatarsal head has healed. He has developed a new small wound on his medial lower leg, in an old scar site. The lateral leg wound continues to contract but continues to accumulate slough, as well. 07/03/2022: Despite wearing his custom orthopedic shoes, he managed to reopen the wound on his first metatarsal head. He says he thinks his foot got wet and then some skin lifted up and he peeled this away. Both of the lower leg wounds are smaller and have some dry eschar on the surface. The lateral leg wound is quite a bit narrower today. 07/12/2022: The medial lower leg wound is closed. The anterior lower leg wound has contracted considerably. The lateral upper leg wound is narrower with a layer of slough on the surface. The first metatarsal head wound is also smaller, but had copious drainage which saturated the foam border dressing and resulted in some periwound tissue maceration. Fortunately there was no breakdown at this site. 07/19/2022: The lower leg shows signs of significant maceration; I think he must be sweating excessively inside his cast. There are several areas of skin breakdown present. The wound on his foot is smaller and that on his lateral leg is narrower and is shorter by about a centimeter. 07/26/2022: Last week we used a zinc Coflex wrap prior to applying his total contact cast and this has had the effect of keeping his skin from getting macerated this week. The anterior leg wound has epithelialized substantially. The lateral leg wound is significantly smaller with just a bit of slough on the surface. The first metatarsal head wound is also smaller this week. 08/02/2022: The anterior leg wound was closed on arrival, but while he was sitting in the room, he picked it open again. The lateral leg  wound is smaller with just a little slough on the surface and the first metatarsal head wound has contracted further, as well. 08/09/2022: The first metatarsal head wound is covered with callus. Underneath the callus, it is nearly completely closed. The lateral leg wound is smaller again this week. The anterior leg wound looks better, but he has such heavy buildup of old skin, that moisture is getting underneath this, becoming trapped, and causing the underlying skin to get macerated and open up. 08/16/2022: The first metatarsal head wound is closed. The lateral leg wound continues to contract and is quite a bit smaller again this week. There is just a small, superficial opening remaining  on his anterior tibial surface. 08/23/2022: The first metatarsal head wound has, by some miracle, remained closed. The lateral leg wound is substantially smaller with multiple areas of epithelialization. The anterior tibial surface wound is also quite a bit smaller and very clean. Electronic Signature(s) Signed: 08/23/2022 12:02:50 PM By: Fredirick Maudlin MD FACS Entered By: Fredirick Maudlin on 08/23/2022 12:02:50 -------------------------------------------------------------------------------- Physical Exam Details Patient Name: Date of Service: [name]. 08/23/2022 10:45 A M Medical Record Number: [id number] Patient Account Number: [id number] Date of Birth/Sex: Treating RN: 1951-06-19 (72 y.o. M) Primary Care Provider: [name] Other Clinician: Referring Provider: Treating Provider/Extender: [name] in Treatment: 8 Constitutional Slightly hypertensive. Slightly bradycardic. . . no acute distress. TRAUIS, [name] ([id number]) [id number] Page 7 of 19 Respiratory Normal work of breathing on room air. Notes 08/23/2022: The first metatarsal head wound has, by some miracle, remained closed. The lateral leg wound is substantially smaller with  multiple areas of epithelialization. The anterior tibial surface wound is also quite a bit smaller and very clean. Electronic Signature(s) Signed: 08/23/2022 12:03:32 PM By: Fredirick Maudlin MD FACS Entered By: Fredirick Maudlin on 08/23/2022 12:03:32 -------------------------------------------------------------------------------- Physician Orders Details Patient Name: Date of Service: [name]. 08/23/2022 10:45 A M Medical Record Number: [id number] Patient Account Number: [id number] Date of Birth/Sex: Treating RN: May 05, 1951 (72 y.o. 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 - Dr Celine Ahr - Room 2 Return A Anesthetic Wound #22 Left,Proximal,Lateral Lower Leg (In clinic) Topical Lidocaine 4% applied to wound bed Bathing/ Shower/ Hygiene May shower with protection but do not get wound dressing(s) wet. Protect dressing(s) with water repellant cover (for example, large plastic bag) or a cast cover and may then take shower. Edema Control - Lymphedema / SCD / Other Avoid standing for long periods of time. Patient to wear own compression stockings every day. - on right leg; Moisturize legs daily. Compression stocking or Garment 20-30 mm/Hg pressure to: - left leg daily Off-Loading Other: - minimal weight bearing left foot Wound Treatment Wound #18R - Metatarsal head first Wound Laterality: Plantar, Left Cleanser: Soap and  Water 1 x Per Week/30 Days Discharge Instructions: May shower and wash wound with dial antibacterial soap and water prior to dressing change. Cleanser: Wound Cleanser 1 x Per Week/30 Days Discharge Instructions: Cleanse the wound with wound cleanser prior to applying a clean dressing using gauze sponges, not tissue or cotton balls. Prim Dressing: Sorbalgon AG Dressing 2x2 (in/in) 1 x Per Week/30 Days ary Discharge Instructions: Apply to wound bed as instructed Secondary Dressing: ALLEVYN Heel 4 1/2in x 5 1/2in / 10.5cm x 13.5cm 1 x Per Week/30 Days Discharge Instructions: Apply over primary dressing as directed. Secondary Dressing: Optifoam Non-Adhesive Dressing, 4x4 in 1 x Per Week/30 Days Discharge Instructions: Apply over primary dressing as directed. Secondary Dressing: Zetuvit Plus Silicone Border Dressing 4x4 (in/in) 1 x Per Week/30 Days Discharge Instructions: Apply silicone border over primary dressing as directed. Wound #22 - Lower Leg Wound Laterality: Left, Lateral, Proximal [name], [name] ([id number]) [id number] Page 8 of 19 Cleanser:  Soap and Water 3 x Per Week/30 Days Discharge Instructions: May shower and wash wound with dial antibacterial soap and water prior to dressing change. Cleanser: Wound Cleanser 3 x Per Week/30 Days Discharge Instructions: Cleanse the wound with wound cleanser prior to applying a clean dressing using gauze sponges, not tissue or cotton balls. Peri-Wound Care: Sween Lotion (Moisturizing lotion) 3 x Per Week/30 Days Discharge Instructions: Apply moisturizing lotion to the leg Topical: Skintegrity Hydrogel 4 (oz) 3 x Per Week/30 Days Discharge Instructions: Apply hydrogel as directed Prim Dressing: Endoform 2x2 in 3 x Per Week/30 Days ary Discharge Instructions: Moisten with Hydrogel or saline Secondary Dressing: ABD Pad, 8x10 (Generic) 3 x Per Week/30 Days Discharge Instructions: Apply over primary dressing as  directed. Secured With: Elastic Bandage 4 inch (ACE bandage) 3 x Per Week/30 Days Discharge Instructions: Secure with ACE bandage as directed. Wound #30 - Lower Leg Wound Laterality: Left, Anterior Cleanser: Soap and Water 1 x Per Week/30 Days Discharge Instructions: May shower and wash wound with dial antibacterial soap and water prior to dressing change. Cleanser: Wound Cleanser 1 x Per Week/30 Days Discharge Instructions: Cleanse the wound with wound cleanser prior to applying a clean dressing using gauze sponges, not tissue or cotton balls. Prim Dressing: Sorbalgon AG Dressing 2x2 (in/in) 1 x Per Week/30 Days ary Discharge Instructions: Apply to wound bed as instructed Secondary Dressing: Zetuvit Plus Silicone Border Dressing 4x4 (in/in) 1 x Per Week/30 Days Discharge Instructions: Apply silicone border over primary dressing as directed. Compression Wrap: CoFlex TLC Zinc 1 x Per Week/30 Days Electronic Signature(s) Signed: 08/23/2022 12:28:35 PM By: Fredirick Maudlin MD FACS Entered By: Fredirick Maudlin on 08/23/2022 12:04:37 -------------------------------------------------------------------------------- Problem List Details Patient Name: Date of Service: [name]. 08/23/2022 10:45 A M Medical Record Number: [id number] Patient Account Number: [id number] Date of Birth/Sex: Treating RN: [birth year] (72 y.o. M) Primary Care Provider: [name] Other Clinician: Referring Provider: Treating Provider/Extender: [name] in Treatment: 61 Active Problems ICD-10 Encounter Code Description Active Date MDM Diagnosis L97.828 Non-pressure chronic ulcer of other part of left lower leg with other specified 04/12/2021 No Yes severity E11.51 Type 2 diabetes mellitus with diabetic peripheral angiopathy without gangrene 04/12/2021 No Yes I89.0 Lymphedema, not elsewhere classified 04/12/2021 No Yes I87.322 Chronic venous hypertension (idiopathic) with inflammation  of left lower 04/12/2021 No Yes extremity MIRON, LETTS ([id number]) [id number] Page 9 of 19 Inactive Problems ICD-10 Code Description Active Date Inactive Date L97.528 Non-pressure chronic ulcer of other part of left foot with other specified severity 04/12/2021 04/12/2021 E11.621 Type 2 diabetes mellitus with foot ulcer 04/12/2021 04/12/2021 L97.828 Non-pressure chronic ulcer of other part of left lower leg with other specified severity 06/08/2022 06/08/2022 E11.42 Type 2 diabetes mellitus with diabetic polyneuropathy 04/12/2021 04/12/2021 L02.416 Cutaneous abscess of left lower limb 06/13/2021 06/13/2021 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 07/20/2021 07/20/2021 Resolved Problems Electronic Signature(s) Signed: 08/23/2022 11:58:08 AM By: Fredirick Maudlin MD FACS Entered By: Fredirick Maudlin on 08/23/2022 11:58:07 -------------------------------------------------------------------------------- Progress Note Details Patient Name: Date of Service: [name]. 08/23/2022 10:45 A M Medical Record Number: [id number] Patient Account Number: [id number] Date of Birth/Sex: Treating RN: 1950/08/06 (72 y.o. M) Primary Care Provider: [name] Other Clinician: Referring Provider: Treating Provider/Extender: [name] in Treatment: 24 Subjective Chief Complaint Information obtained from Patient Left leg and foot ulcers 04/12/2021; patient is here for wounds on his left lower leg and left plantar foot over the  first metatarsal head History of Present Illness (HPI) 10/11/17; Mr. Sicher is a 72 year old man who tells me that in 2015 he slipped down the latter traumatizing his left leg. He developed a wound in the same spot the area that we are currently looking at. He states this closed over for the most part although he always felt it was somewhat unstable. In 2016 he hit the same area with the door  of his car had this reopened. He tells me that this is never really closed although sometimes an inflow it remains open on a constant basis. He has not been using any specific dressing to this except for topical antibiotics the nature of which were not really sure. His primary doctor did send him to see Dr. [name] of interventional cardiology. He underwent an angiogram on 08/06/17 and he underwent a PTA and directional atherectomy of the lesser distal SFA and popliteal arteries which resulted in brisk improvement in blood flow. It was noted that he had 2 vessel runoff through the anterior tibial and peroneal. He is also been to see vascular and interventional radiologist. He was not felt to have any significant superficial venous insufficiency. Presumably is not a candidate for any ablation. It was suggested he come here for wound care. The patient is a type II diabetic on insulin. He also has a history of venous insufficiency. ABIs on the left were noncompressible in our clinic 10/21/17; patient we admitted to the clinic last week. 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 We put Iodosorb on him after an aggressive debridement and 3 layer compression. He complained of pain in his ankle and itching with is skin in fact he scratched the area on the medial calf superiorly at the rim of our wraps and he has 2 small open areas in that location today which are new. I changed his primary dressing today to silver collagen. As noted he is already had revascularization and does not have any significant superficial venous insufficiency that would be amenable to ablation 10/28/17; patient admitted to the clinic 2 weeks ago. He has a smaller Wound. Scratch injury from last week revealed. There is large wound over the tibial area. This is smaller. Granulation looks healthy. No need for debridement. 11/04/17; the wound on the left lateral calf looks  better. Improved dimensions. Surface of this looks better. We've been maintaining him and Kerlix Coban wraps. He finds this much more comfortable. Silver collagen dressing 11/11/17; left lateral Wound continues to look healthy be making progress. Using a #5 curet I removed removed nonviable skin from the surface of the wound and then necrotic debris from the wound surface. Surface of the wound continues to look healthy. ooHe also has an open area on the left great toenail bed. We've been using topical antibiotics. 11/19/17; left anterior lateral wound continues to look healthy but it's not closed. [name], [name] ([id number]) [id number] Page 10 of 5 ooHe also had a small wound above this on the left leg ooInitially traumatic wounds in the setting of significant chronic venous insufficiency and stasis dermatitis 11/25/17; left anterior wounds superiorly is closed still a small wound inferiorly. 12/02/17; left anterior tibial area. Arrives today with adherent callus. Post debridement clearly not completely closed. Hydrofera Blue under 3 layer compression. 12/09/17; left anterior tibia. Circumferential eschar however the wound bed looks stable to improved. We've been using Hydrofera Blue under 3 layer compression 12/17/17; left anterior tibia.  Apparently this was felt to be closed however when the wrap was taken off there is a skin tear to reopen wounds in the same area we've been using Hydrofera Blue under 3 layer compression 12/23/17 left anterior tibia. Not close to close this week apparently the [facility] was stuck to this again. Still circumferential eschar requiring debridement. I put a contact layer on this this time under the Hydrofera Blue 12/31/17; left anterior tibia. Wound is better slight amount of hyper-granulation. Using Hydrofera Blue over Adaptic. 01/07/18; left anterior tibia. The wound had some surface eschar however after this was removed he has no open  wound.he was already revascularized by Dr. [name] when he came to our clinic with atherectomy of the left SFA and popliteal artery. He was also sent to interventional radiology for venous reflux studies. He was not felt to have significant reflux but certainly has chronic venous changes of his skin with hemosiderin deposition around this area. He will definitely need to lubricate his skin and wear compression stocking and I've talked to him about this. READMISSION 05/26/2018 This is a now 72 year old man we cared for with traumatic wounds on his left anterior lower extremity. He had been previously revascularized during that admission by Dr. [name]. Apparently in follow-up Dr. [name] noted that he had deterioration in his arterial status. He underwent a stent placement in the distal left SFA on 04/22/2018. 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 He went back to the angiography suite on 04/30/2018 he underwent PTA and balloon angioplasty of the occluded left mid anterior tibial artery, thrombotic occlusion went from 100 to 0% which reconstitutes the posterior tibial artery. He had thrombectomy and aspiration of the peroneal artery. The stent placed in the distal SFA left SFA was still occluded. 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 He has been recuperating at [place] home in [facility] after the most recent hospitalization. He is going home tomorrow. He tells me that 2 weeks ago he traumatized the tip of his left fifth toe. He came in urgently for our review of this. This was a history of before I noted that Dr. [name] had already noted dry gangrenous changes of the left fifth toe 06/09/2018; 2-week follow-up. I did contact Dr.  [name] after his last appointment and he apparently saw 1 of Dr. Irven Shelling colleagues the next day. He does not follow-up with Dr. [name] himself until Thursday of this week. 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 Nevertheless there is no evidence of infection no drainage and no pain. He had a new area that this week when we were signing him in today on the left anterior mid tibia area, this is in close proximity to the previous wound we have dealt with in this clinic. 06/23/2018; 2-week follow-up. I did not receive a recent note from Dr. [name] to review today. Our office is trying to obtain this. He is apparently not planning to do further vascular interventions and wondered about compression to try and help with the patient's chronic venous insufficiency. However we are also concerned about the arterial flow. ooHe arrives in clinic today with a new area on the left third toe. The areas on the  calf/anterior tibia are close to closing. The left fifth toe is still mummified using Betadine. -In reviewing things with the patient he has what sounds like claudication with mild to moderate amount of activity. 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 I prescribed Levaquin over the phone while we attempted to arrange a plan of care. However the patient called yesterday to report he had low-grade fever and he came in today acutely. There is been a marked deterioration in the left third toe with spreading cellulitis up into the dorsal left foot. He was referred to the emergency room. Readmission: 06/29/2020 patient presents today for reevaluation here in our clinic he was previously treated by Dr. Dellia Nims at the latter part of 2019 in 2 the beginning of 2020. Subsequently we have not seen him since that time in the interim he did have evaluation with vein and vascular specialist specifically Dr. Anice Paganini who did perform quite extensive work for a left  femoral to anterior tibial artery bypass. With that being said in the interim the patient has developed significant lymphedema and has wounds that he tells me have really never healed in regard to the incision site on the left leg. He also has multiple wounds on the feet for various reasons some of which is that he tends to pick at his feet. Fortunately there is no signs of active infection systemically at this time he does have some wounds that are little bit deeper but most are fairly superficial he seems to have good blood flow and overall everything appears to be healthy I see no bone exposed and no obvious signs of osteomyelitis. I do not know that he necessarily needs a x-ray at this point although that something we could consider depending on how things progress. The patient does have a history of lymphedema, diabetes, this is type II, chronic kidney disease stage III, hypertension, and history of peripheral vascular disease. 07/05/2020; patient admitted last week. Is a patient I remember from 2019 he had a spreading infection involving the left foot and we sent him to the hospital. 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 He subsequently had a left femoral to anterior tibial bypass by Dr.Cain vein and vascular. He also has severe lymphedema with chronic skin changes related to that on the left leg. The most problematic area that was new today was on the left medial great toe. This was apparently a small area last week there was purulent drainage which our intake nurse cultured. Also areas on the left medial foot and heel left lateral foot. He has 2 areas on the left medial calf left lateral calf in the setting of the severe lymphedema. 07/13/2020 on evaluation today patient appears to be doing better in my opinion compared to his last visit. The good news is there is no signs of active infection systemically and locally I do not see any signs of infection  either. He did have an x-ray which was negative that is great news he had a culture which showed MRSA but at the same time he is been on the doxycycline which has helped. I do think we may want to extend this for 7 additional days 1/25; patient admitted to the clinic a few weeks ago. 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 We have been putting him under compression his edema control is a lot better but he  is severe verricused skin on the left leg. He is really done quite well he still has an open area on the left medial calf and the left medial first metatarsal head. We have been using silver collagen on the leg silver alginate on the foot 07/27/2020 upon evaluation today patient appears to be doing decently well in regard to his wounds. He still has a lot of dry skin on the left leg. Some of this is starting to peel back and I think he may be able to have them out by removing some that today. Fortunately there is no signs of active infection at this time on the left leg although on the right leg he does appear to have swelling and erythema as well as some mild warmth to touch. This does have been concerned about the possibility of cellulitis although within the differential diagnosis I do think that potentially a DVT has to be at least considered. We need to rule that out before proceeding would just call in the cellulitis. Especially since he is having pain in the posterior aspect of his calf muscle. 2/8; the patient had seen sparingly. He has severe skin changes of chronic lymphedema in the left leg thickened hyperkeratotic verrucous skin. He has an open wound on the medial part of the left first met head left mid tibia. He also has a rim of nonepithelialized skin in the anterior mid tibia. He brought in the AmLactin lotion that was been prescribed although I am not sure under compression and its utility. There concern about cellulitis on the right lower  leg the last time he was here. He was put on on antibiotics. His DVT rule out was negative. The right leg looks fine he is using his stocking on this area 08/10/2020 upon evaluation today patient appears to be doing well with regard to his leg currently. He has been tolerating the dressing changes without complication. Fortunately there is no signs of active infection which is great news. Overall very pleased with where things stand. 2/22; the patient still has an area on the medial part of the left first met his head. This looks better than when I last saw this earlier this month he has a rim of epithelialization but still some surface debris. Mostly everything on the left leg is healed. There is still a vulnerable in the left mid tibia area. 08/30/2020 upon evaluation today patient appears to be doing much better in regard to his wounds on his foot. 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 Nonetheless I think we will address that today I Minna send in a prescription for him in that regard. Overall though there does not appear to be any signs of significant worsening. [name], [name] ([id number]) [id number] Page 11 of 19 09/07/2020 on evaluation today patient's wounds over his left foot appear to be doing excellent. I do not see any signs of infection there is some callus buildup this can require debridement for certain but overall I feel like he is managing quite nicely. He still using the AmLactin cream which has been beneficial for him as well. 3/22; left foot wound is closed. There is no open area here. He is using ammonium lactate lotion to the lower extremities to help exfoliate dry cracked skin. He has compression stockings from elastic therapy in [place]. The wound on the medial part  of his left first met head is healed  today. READMISSION 04/12/2021 Mr. Pfahler is a patient we know fairly well he had a prolonged stay in clinic in 2019 with wounds on his left lateral and left anterior lower extremity in the setting of chronic venous insufficiency. More recently he was here earlier this year with predominantly an area on his left foot first metatarsal head plantar and he says the plantar foot broke down on its not long after we discharged him but he did not come back here. The last few months areas of broken down on his left anterior and again the left lateral lower extremity. The leg itself is very swollen chronically enlarged a lot of hyperkeratotic dry Berry Q skin in the left lower leg. His edema extends well into the thigh. He was seen by Dr. Donzetta Matters. He had ABIs on 03/02/2021 showing an ABI on the right of 1 with a TBI of 0.72 his ABI in the left at 1.09 TBI of 0.99. Monophasic and biphasic waveforms on the right. On the left monophasic waveforms were noted he went on to have an angiogram on 03/27/2021 this showed the aortic aortic and iliac segments were free of flow-limiting stenosis the left common femoral vein to evaluate the left femoral to anterior tibial artery bypass was unobstructed the bypass was patent without any areas of stenosis. We discharged the patient in bilateral juxta lite stockings but very clearly that was not sufficient to control the swelling and maintain skin integrity. He is clearly going to need compression pumps. 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 This is fortunate because he is on his feet for long periods of time. 10/27; patient comes in with our intake nurse reporting copious amount of green drainage from the left anterior mid tibia the left dorsal foot and to a lesser extent the left medial mid tibia. We left the compression wrap on all week for the amount of edema in his left leg is quite a bit better. We use silver alginate  as the primary dressing 11/3; edema control is good. Left anterior lower leg left medial lower leg and the plantar first metatarsal head. The left anterior lower leg required debridement. Deep tissue culture I did of this wound showed MRSA I put him on 10 days of doxycycline which she will start today. We have him in compression wraps. He has a security card and [employer] however he is retiring on November 15. We will need to then get him into a better offloading boot for the left foot perhaps a total contact cast 11/10; edema control is quite good. Left anterior and left medial lower leg wounds in the setting of chronic venous insufficiency and lymphedema. He also has a substantial area over the left plantar first metatarsal head. 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 He has significant hypergranulation on the left plantar foot wound. The patient is a diabetic but he does not have significant PAD 11/17; edema control is quite good. Left anterior and left medial lower leg wounds look better. The really concerning area remains the area on the left plantar first metatarsal head. He has a rim of epithelialization. He has been using a surgical shoe The patient is now retired from a a [employer] I have gone over with him the need to offload  this area aggressively. Starting today with a forefoot off loader but . possibly a total contact cast. 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 Arrives with a very significant odor from I think all of the wounds on the left leg and the left foot. Because of this I did not put a total contact cast on him today but will could still consider this. His wife was having cataract surgery which is the reason he missed the appointment 12/6. I saw this man 5 days ago  with a swelling below the popliteal fossa. I thought he actually might have a Baker's cyst however the DVT rule out study that we could arrange right away was negative the technician told me this was not a ruptured Baker's cyst. We attempted to get this aspirated by under ultrasound guidance in interventional radiology however all they did was an ultrasound however it shows an extensive fluid collection 62 x 8 x 9.4 in the left thigh and left calf. The patient states he thinks this started 8 days ago or so but he really is not complaining of any pain, fever or systemic symptoms. He has not ha 12/20; after some difficulty I managed to get the patient into see Dr. Donzetta Matters. Eventually he was taken into the hospital and had a drain put in the fluid collection below his left knee posteriorly extending into the posterior thigh. He still has the drain in place. Culture of this showed moderate staff aureus few Morganella and few Klebsiella he is now on doxycycline and ciprofloxacin as suggested by infectious disease he is on this for a month. The drain will remain in place until it stops draining 12/29; he comes in today with the 1 wound on his left leg and the area on the left plantar first met head significantly smaller. Both look healthy. He still has the drain in the left leg. He says he has to change this daily. Follows up with Dr. Donzetta Matters on January 11. 06/29/2021; the wounds that I am following on the left leg and left first met head continued to be quite healthy. However the area where his inferior drain is in place had copious amounts of drainage which was green in color. The wound here is larger. Follows up with Dr. Gwenlyn Saran of vein and vascular his surgeon next week as well as infectious disease. He remains on ciprofloxacin and doxycycline. He is not complaining of excessive pain in either one of the drain areas 1/12; the patient saw vascular surgery and infectious disease. Vascular surgery has left the drain in  place as there was still some notable drainage still see him back in 2 weeks. Dr. Velna Ochs stop the doxycycline and ciprofloxacin and I do not believe he follows up with them at this point. Culture I did last week showed both doxycycline resistant MRSA and Pseudomonas not sensitive to ciprofloxacin although only in rare titers 1/19; the patient's wound on the left anterior lower leg is just about healed. We have continued healing of the area that was medially on the left leg. Left first plantar metatarsal head continues to get smaller. The major problem here is his 2 drain sites 1 on the left upper calf and lateral thigh. There is purulent drainage still from the left lateral thigh. I gave him antibiotics last week but we still have recultured. He has the drain  in the area I think this is eventually going to have to come out. I suspect there will be a connecting wound to heal here perhaps with improved VAc 1/26; the patient had his drain removed by vein and vascular on 1/25/. This was a large pocket of fluid in his left thigh that seem to tunnel into his left upper calf. 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 His mention his Penrose drain was removed today. He now has a tunneling wound on his left calf and left thigh. Both of these probe widely towards each other although I cannot really prove that they connect. Both wounds on his lower leg anteriorly are closed and his area over the first metatarsal head on his right foot continues to improve. We are using Hydrofera Blue here. He also saw infectious disease culture of the abscess they noted was polymicrobial with MRSA, Morganella and Klebsiella he was treated with doxycycline and ciprofloxacin for 4 weeks ending on 07/03/2021. They did not recommend any further antibiotics. Notable that while he still had the Penrose drain in place last week he had purulent drainage coming out of the inferior IandD site this grew [facility], MRSA  and Pseudomonas but there does not appear to be any active infection in this area today with the drain out and he is not systemically unwell 2/2; with regards to the drain sites the superior one on the thigh actually is closed down the one on the upper left lateral calf measures about 8 and half centimeters which is an improvement seems to be less prominent although still with a lot of drainage. The only remaining wound is over the first metatarsal head on the left foot and this looks to be continuing to improve with Hydrofera Blue. 2/9; the area on his plantar left foot continues to contract. Callus around the wound edge. The drain sites specifically have not come down in depth. We put the wound VAC on Monday he changed the canister late last night our intake nurse reported a pocket of fluid perhaps caused by our compression wraps [name], HOVSEPIAN ([id number]) [id number] Page 12 of 19 2/16; continued improvement in left foot plantar wound. drainage site in the calf is not improved in terms of depth (wound vac) 2/23; continued improvement in the left foot wound over the first metatarsal head. With regards to the drain sites the area on his thigh laterally is healed however the open area on his calf is small in terms of circumference by still probes in by about 15 cm. Within using the wound VAC. Hydrofera Blue on his foot 08/24/2021: The left first metatarsal head wound continues to improve. The wound bed is healthy with just some surrounding callus. Unfortunately the open drain site on his calf remains open and tunnels at least 15 cm (the extent of a Q-tip). This is despite several weeks of wound VAC treatment. Based on reading back through the notes, there has been really no significant change in the depth of the wound, although the orifice is smaller and the more cranial wound on his thigh has closed. I suspect the tunnel tracks nearly all the way to this  location. 08/31/2021: Continued improvement in the left first metatarsal head wound. There has been absolutely no improvement to the long tunnel from his open drain site on his calf. We have tried to get him into see vascular surgery sooner to consider the possibility of simply filleting the tract open and allowing it to  heal from the bottom up, likely with a wound VAC. They have not yet scheduled a sooner appointment than his current mid April 09/14/2021: He was seen by vascular surgery and they took him to the operating room last week. They opened a portion of the tunnel, but did not extend the entire length of the known open subcutaneous tract. I read Dr. Claretha Cooper operative note and it is not clear from that documentation why only a portion of the tract was opened. The heaped up granulation tissue was curetted and removed from at least some portion of the tract. They did place a wound VAC and applied an Unna boot to the leg. The ulcer on his left first metatarsal head is smaller today. The bed looks good and there is just a small amount of surrounding callus. 09/21/2021: The ulcer on his left first metatarsal head looks to be stalled. There is some callus surrounding the wound but the wound bed itself does not appear particularly dynamic. The tunnel tract on his lateral left leg seems to be roughly the same length or perhaps slightly smaller but the wound bed appears healthy with good granulation tissue. He opened up a new wound on his medial thigh and the site of a prior surgical incision. He says that he did this unconsciously in his sleep by scratching. 09/28/2021: Unfortunately, the ulcer on his left first metatarsal head has extended underneath the callus toward the dorsum of his foot. The medial thigh wounds are roughly the same. The tunnel on his lateral left leg continues to be problematic; it is longer than we are able to actually probe with a Q-tip. I am still not certain as to why Dr. Donzetta Matters did  not open this up entirely when he took the patient to the operating room. We will likely be back in the same situation with just a small superficial opening in a long unhealed tract, as the open portion is granulating in nicely. 10/02/2021: The patient was initially scheduled for a nurse visit, but we are also applying a total contact cast today. The plantar foot wound looks clean without significant accumulated callus. We have been applying Prisma silver collagen to the site. 10/05/2021: The patient is here for his first total contact cast change. We have tried using gauze packing strips in the tunnel on his lateral leg wound, but this does not seem to be working any better than the white VAC foam. The foot ulcer looks about the same with minimal periwound callus. Medial thigh wound is clean with just some overlying eschar. 10/12/2021: The plantar foot wound is stable without any significant accumulation of periwound callus. The surface is viable with good granulation tissue. The medial thigh wounds are much smaller and are epithelializing. On the other hand, he had purulent drainage coming from the tunnel on his lateral leg. He does go back to see Dr. Donzetta Matters next week and is planning to ask him why the wound tunnel was not completely opened at the time of his most recent operation. 10/19/2021: The plantar foot wound is markedly improved and has epithelial tissue coming through the surface. The medial thigh wounds are nearly closed with just a tiny open area. He did see Dr. Donzetta Matters earlier this week and apparently they did discuss the possibility of opening the sinus tract further and enabling a wound VAC application. Apparently there are some limits as to what Dr. 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 I think if we could get  the tract open to the level of the popliteal fossa, this would greatly aid in her ability to get this chart closed. That being said, however,  today when I probed the tract with a Q-tip, I was not able to insert the entirety of the Q-tip as I have on previous occasions. The tunnel is shorter by about 4 cm. The surface is clean with good granulation tissue and no further episodes of purulent drainage. 10/30/2021: Last week, the patient underwent surgery and had the long tract in his leg opened. There was a rind that was debrided, according to the operative report. His medial thigh ulcers are closed. The plantar foot wound is clean with a good surface and some built up surrounding callus. 11/06/2021: The overall dimensions of the large wound on his lateral leg remain about the same, but there is good granulation tissue present and the tunneling is a little bit shorter. He has a new wound on his anterior tibial surface, in the same location where he had a similar lesion in the past. The plantar foot wound is clean with some buildup surrounding callus. Just toward the medial aspect of his foot, however, there is an area of darkening that once debrided, revealed another opening in the skin surface. 11/13/2021: The anterior tibial surface wound is closed. The plantar foot wound has some surrounding callus buildup. The area of darkening that I debrided last week and revealed an opening in the skin surface has closed again. The tunnel in the large wound on his lateral leg has come in by about 3 cm. There is healthy granulation tissue on the entire wound surface. 11/23/2021: The patient was out of town last week and did wet-to-dry dressings on his large wound. 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 Unfortunately, he picked open the wound on his left medial thigh. He says that it was itching and he just could not stop scratching it until it was open again. The wound on his plantar foot is smaller and has not accumulated a tremendous amount of callus. The lateral leg wound is shallower  and the tunnel has also decreased in depth. There is just a little bit of slough accumulation on the surface. 11/30/2021: Another portion of his left medial thigh has been opened up. All of these wounds are fairly superficial with just a little bit of slough and eschar accumulation. The wound on his plantar foot is almost closed with just a bit of eschar and periwound callus accumulation. The lateral leg wound is nearly flush with the surrounding skin and the tunnel is markedly shallower. 12/07/2021: There is just 1 open area on his left medial thigh. It is clean with just a little bit of perimeter eschar. The wound on his plantar foot continues to contract and just has some eschar and periwound callus accumulation. The lateral leg wound is closing at the more distal aspect and the tunnel is smaller. The surface is nearly flush with the surrounding skin and it has a good bed of granulation tissue. 12/14/2021: The thigh and foot wounds are closed. The lateral leg wound has closed over approximately half of its length. The tunnel continues to contract and the surface is now flush with the surrounding skin. The wound bed has robust granulation tissue. 12/22/2021: The thigh and foot wounds have reopened. The foot wound has a lot of callus accumulation around and over it. The thigh wound is tiny with just  a little bit of slough in the wound bed. The lateral leg wound continues to contract. His vascular surgeon took the wound VAC off earlier in the week and the patient has been doing wet-to-dry dressings. There is a little slough accumulation on the surface. The tunnel is about 3 cm in depth at this point. 12/28/2021: The thigh wound is closed again. The foot wound has some callus that subsequently has peeled back exposing just a small slit of a wound. The lateral leg wound Is down to about half the size that it originally was and the tunnel is down to about half a centimeter in depth. 01/04/2022: The thigh wound  remains closed. The foot wound has heavy callus overlying the wound site. Once this was debrided, the wound was found to be closed. The lateral leg wound is smaller again this week and very superficial. No tunnel could be identified. 01/12/2022: The thigh and foot wounds both remain closed. The lateral leg wound is now nearly flush with the skin surface. There is good granulation tissue present with a light layer of slough. 01/19/2022: Due to the way his wrap was placed, the patient did not change the dressing on his thigh at all and so the foam was saturated and his skin is macerated. There is a light layer of slough on the wound surface. The underlying granulation tissue is robust and healthy-appearing. He has heavy callus buildup at the site of his first metatarsal head wound which is still healed. [name], [name] ([id number]) [id number] Page 13 of 19 02/01/2022: He has been in silver alginate. When he removed the dressing from his thigh wound, however, some leg, superficially reopening a portion of the wound that had healed. In addition, underneath the callus at his left first metatarsal head, there appears to be a blister and the wound appears to be open again. 02/08/2022: The lateral leg wound has contracted substantially. There is eschar and a light layer of slough present. He says that it is starting to pull and is uncomfortable. On inspection, there is some puckering of the scar and the eschar is quite dry; this may account for his symptoms. On his first metatarsal head, the wound is much smaller with just some eschar on the surface. The callus has not reaccumulated. He reports that he had a blister come up on his medial thigh wound at the distal aspect. It popped and there is now an opening in his skin again. Looking back through his [place] of wound photos, there is what looks like a permanent suture just deep to this location and it may be trying to erode through. We  have been using silver alginate on his wounds. 02/15/2022: The lateral leg wound is about half the size it was last week. It is clean with just a little perimeter eschar and light slough. The wound on his first metatarsal head is about the same with heavy callus overlying it. The medial thigh wound is closed again. He does have some skin changes on the top of his foot that looks potentially yeast related. 02/22/2022: The skin on the top of his foot improved with the use of a topical antifungal. The lateral leg wound continues to contract and is again smaller this week. There is a little bit of slough and eschar on the surface. The first metatarsal head wound is a little bit smaller but has reaccumulated a thick callus over the top. He decided to try to trim his toenail and ultimately took the entire  nail off of his left great toe. 03/02/2022: His lateral leg wound continues to improve, as does the wound on his left great toe. Unfortunately, it appears that somehow his foot got wet and moisture seeped in through the opening causing his skin to lift. There is a large wound now overlying his first metatarsal on both the plantar, medial, and dorsal portion of his foot. There is necrotic tissue and slough present underneath the shaggy macerated skin. 03/08/2022: The lateral leg wound is smaller again today. There is just a light layer of slough and eschar on the surface. The great toe wound is smaller again today. The first metatarsal wound is a little bit smaller today and does not look nearly as necrotic and macerated. There is still slough and nonviable tissue present. 03/15/2022: The lateral leg wound is narrower and just has a little bit of light slough buildup. The first metatarsal wound still has a fair amount of moisture affecting the periwound skin. The great toe wound is healed. 03/22/2022: The lateral leg wound is now isolated to just at the level of his knee. There is some eschar and slough  accumulation. The first metatarsal head wound has epithelialized tremendously and is about half the size that it was last week. He still has some maceration on the top of his foot and a fungal odor is present. 03/29/2022: T oday the patient's foot was macerated, suggesting that the cast got wet. The patient has also been picking at his dry skin and has enlarged the wound on his left lateral leg. In the time between having his cast removed and my evaluation, he had picked more dry skin and opened up additional wounds on his Achilles area and dorsal foot. The plantar first metatarsal head wound, however, is smaller and clean with just macerated callus around the perimeter and light slough on the surface. The lateral leg wound measured a little bit larger but is also fairly clean with eschar and minimal slough. 04/02/2022: The patient had vascular studies done last Friday and so his cast was not applied. He is here today to have that done. 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 04/05/2022: Both wounds are smaller and quite clean. There is just a little biofilm on the lateral leg wound. 10/20; the patient has a wound on the left lateral surgical incision at the level of his lateral knee this looks clean and improved. He is using silver alginate. He also has an area on his left medial foot for which she is using Hydrofera Blue under a total contact cast both wounds are measuring smaller 04/20/2022: The plantar foot wound has contracted considerably and is very close to closing. The lateral leg wound was measured a little larger, but there was a tiny open area that was included in the measurements that was not included last week. He has some eschar around the perimeter but otherwise the wound looks clean. 04/27/2022: The lateral leg wound looks better this week. He says that midweek, he felt it was very dry and began applying hydrogel to the site. I think this was beneficial. The foot wound is  nearly closed underneath a thick layer of dry skin and callus. 05/04/2022: The foot wound is healed. He has developed a new small ulcer on his anterior tibial surface about midway up his leg. It has a little slough on the surface. The lateral leg wound still is fairly dry, but clean with just a little biofilm on the surface. 05/11/2022: The  wound on his foot reopened on Wednesday. A large blister formed which then broke open revealing the fat layer underneath. The ulcer on his anterior tibial surface is a little bit larger this week. The lateral leg wound has much better moisture balance this week. Fortunately, prior to his foot wound reopening, he did get the cast made for his orthotic. 05/15/2022: Already, the left medial foot wound has improved. The tissue is less macerated and the surface is clean. The ulcer on his anterior tibial surface continues to enlarge. This seems likely secondary to accumulated moisture. The lateral leg wound continues to have an improved moisture balance with the use of collagen. 05/25/2022: The medial foot wound continues to contract. It is now substantially smaller with just a little slough on the surface. The anterior tibial surface wound continues to enlarge further. Once again, this seems to be secondary to moisture. The lateral leg wound does not seem to be changing much in size, but the moisture balance is better. 06/01/2022: The anterior tibial wound is closed. The medial foot wound is down to just a very small, couple of millimeters, opening. The lateral leg wound has good moisture balance, but remains unchanged in size. 12/15; the patient's anterior tibial wound has reopened, however the area on his right first metatarsal head is closed. The major wound is actually on the superior part of his surgical wound in the left lateral thigh. Not a completely viable surface under illumination. This may at some point require a debridement I think he is currently using  Prisma. As noted the left medial foot wound has closed 06/14/2022: The anterior tibial wound has closed. The lateral leg wound has a better surface but is basically unchanged in size. The left medial foot wound has reopened. It looks as though there was some callus accumulation and moisture got under the callus which caused the tissue to break down again. 06/21/2022: A new wound has opened up just distal to the previous anterior tibial wound. It is small but has hypertrophic granulation tissue present. The lateral leg wound is a little bit narrower and has a layer of slough on the surface. The left medial foot wound is down to just a pinhole. His custom orthotics should be available next week. 06/28/2022: The wound on his first metatarsal head has healed. He has developed a new small wound on his medial lower leg, in an old scar site. The lateral leg wound continues to contract but continues to accumulate slough, as well. 07/03/2022: Despite wearing his custom orthopedic shoes, he managed to reopen the wound on his first metatarsal head. He says he thinks his foot got wet and then some skin lifted up and he peeled this away. Both of the lower leg wounds are smaller and have some dry eschar on the surface. The lateral leg wound is quite a bit narrower today. 07/12/2022: The medial lower leg wound is closed. The anterior lower leg wound has contracted considerably. The lateral upper leg wound is narrower with a layer of slough on the surface. The first metatarsal head wound is also smaller, but had copious drainage which saturated the foam border dressing and resulted in [name], [name] ([id number]) [phone] Page 14 of 19 some periwound tissue maceration. Fortunately there was no breakdown at this site. 07/19/2022: The lower leg shows signs of significant maceration; I think he must be sweating excessively inside his cast. There are several areas of skin breakdown present. The  wound on his foot is smaller and  that on his lateral leg is narrower and is shorter by about a centimeter. 07/26/2022: Last week we used a zinc Coflex wrap prior to applying his total contact cast and this has had the effect of keeping his skin from getting macerated this week. The anterior leg wound has epithelialized substantially. The lateral leg wound is significantly smaller with just a bit of slough on the surface. The first metatarsal head wound is also smaller this week. 08/02/2022: The anterior leg wound was closed on arrival, but while he was sitting in the room, he picked it open again. The lateral leg wound is smaller with just a little slough on the surface and the first metatarsal head wound has contracted further, as well. 08/09/2022: The first metatarsal head wound is covered with callus. Underneath the callus, it is nearly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

## 2022-08-25 NOTE — Progress Notes (Signed)
[name], [name] ([id number]) [id number] Page 1 of 11 Visit Report for 08/23/2022 Arrival Information Details Patient Name: Date of Service: [name], [name] 08/23/2022 10:45 A M Medical Record Number: [id number] Patient Account Number: [id number] Date of Birth/Sex: Treating RN: July 13, 1950 (72 y.o. [name] Primary Care Yentl Verge: [name] Other Clinician: Referring Tsuruko Murtha: Treating Yorel Redder/Extender: [name] in Treatment: 41 Visit Information History Since Last Visit Added or deleted any medications: No Patient Arrived: Ambulatory Any new allergies or adverse reactions: No Arrival Time: 10:54 Signs or symptoms of abuse/neglect since last visito No Accompanied By: self Hospitalized since last visit: No Transfer Assistance: None Implantable device outside of the clinic excluding No Patient Identification Verified: Yes cellular tissue based products placed in the center Secondary Verification Process Completed: Yes since last visit: Patient Requires Transmission-Based Precautions: No Has Compression in Place as Prescribed: Yes Patient Has Alerts: Yes Pain Present Now: No Electronic Signature(s) Signed: 08/23/2022 4:27:41 PM By: Blanche East RN Entered By: Blanche East on 08/23/2022 10:58:54 -------------------------------------------------------------------------------- Compression Therapy Details Patient Name: Date of Service: [name]. 08/23/2022 10:45 A M Medical Record Number: [id number] Patient Account Number: [id number] Date of Birth/Sex: Treating RN: 10-Apr-1951 (72 y.o. [name] Primary Care [name]: [name] Other Clinician: Referring Treydon Henricks: Treating Mohammedali Bedoy/Extender: [name] in Treatment: 71 Compression Therapy Performed for Wound Assessment: Wound #30 Left,Anterior Lower Leg Performed By: Clinician Blanche East, RN Compression Type: Double Layer Post  Procedure Diagnosis Same as Pre-procedure Notes Zinc CoFlex Electronic Signature(s) Signed: 08/23/2022 4:27:41 PM By: Blanche East RN Entered By: Blanche East on 08/23/2022 11:20:52 -------------------------------------------------------------------------------- Compression Therapy Details Patient Name: Date of Service: [name]. 08/23/2022 10:45 A M Medical Record Number: [id number] Patient Account Number: [id number] Date of Birth/Sex: Treating RN: October 12, 1950 (72 y.o. [name] Primary Care Zaedyn Covin: [name] Other Clinician: Referring Wynette Jersey: Treating Gerlean Cid/Extender: [name] in Treatment: 13 Compression Therapy Performed for Wound Assessment: Wound #18R Left,Plantar Metatarsal head first Performed By: Clinician Blanche East, RN Compression Type: Double Layer Post Procedure Diagnosis Same as Scharlene Gloss ([id number] Page 2 of 11 Notes Zinc CoFlex Electronic Signature(s) Signed: 08/23/2022 4:27:41 PM By: Blanche East RN Entered By: Blanche East on 08/23/2022 11:20:52 -------------------------------------------------------------------------------- Encounter Discharge Information Details Patient Name: Date of Service: [name]. 08/23/2022 10:45 A M Medical Record Number: [id number] Patient Account Number: [id number] Date of Birth/Sex: Treating RN: 06/24/1951 (72 y.o. [name] Primary Care Aster Eckrich: [name] Other Clinician: Referring Siddarth Hsiung: Treating Kiamesha Samet/Extender: [name] in Treatment: 10 Encounter Discharge Information Items Post Procedure Vitals Discharge Condition: Stable Temperature (F): 98.2 Ambulatory Status: Ambulatory Pulse (bpm): 56 Discharge Destination: Home Respiratory Rate (breaths/min): 16 Transportation: Private Auto Blood Pressure (mmHg): 147/90 Accompanied By: self Schedule Follow-up Appointment:  Yes Clinical Summary of Care: Electronic Signature(s) Signed: 08/23/2022 4:27:41 PM By: Blanche East RN Entered By: Blanche East on 08/23/2022 11:21:40 -------------------------------------------------------------------------------- Lower Extremity Assessment Details Patient Name: Date of Service: [name], [name] 08/23/2022 10:45 A M Medical Record Number: [id number] Patient Account Number: [id number] Date of Birth/Sex: Treating RN: [birth year] (72 y.o. [name] Primary Care Aditri Louischarles: [name] Other Clinician: Referring Makynlee Kressin: Treating Kinslie Hove/Extender: [name] in Treatment: 76 Edema Assessment Assessed: [Left: No] [Right: No] Edema: [Left: Ye] [Right: s] Calf Left: Right: Point of Measurement: 41 cm From Medial Instep 49 cm Ankle Left: Right: Point of Measurement: 10 cm From  Medial Instep 31.8 cm Vascular Assessment Pulses: Dorsalis Pedis Palpable: [Left:Yes] Electronic Signature(s) Signed: 08/23/2022 4:27:41 PM By: Blanche East RN Entered By: Blanche East on 08/23/2022 10:59:28 [name] ([id number] Page 3 of 11 -------------------------------------------------------------------------------- Multi Wound Chart Details Patient Name: Date of Service: [name], [name] 08/23/2022 10:45 A M Medical Record Number: [id number] Patient Account Number: [id number] Date of Birth/Sex: Treating RN: 1950-10-11 (72 y.o. 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 Primary Etiology: Extremity Glaucoma, Sleep Apnea, Glaucoma, Sleep Apnea, Glaucoma, Sleep Apnea, Comorbid History: Hypertension, Peripheral Arterial Hypertension, Peripheral Arterial Hypertension, Peripheral Arterial Disease, Peripheral Venous Disease, Disease, Peripheral Venous Disease, Disease, Peripheral Venous Disease, Type II Diabetes, Gout, Osteoarthritis, Type II Diabetes, Gout, Osteoarthritis, Type II Diabetes, Gout, Osteoarthritis, Neuropathy Neuropathy Neuropathy 08/23/2020 06/03/2021 06/21/2022 Date Acquired: 71 63 9 Weeks of Treatment: Open Open Open Wound Status: Yes No No Wound Recurrence: No Yes No Clustered Wound: N/A 3 N/A Clustered Quantity: 0.1x0.1x0.1 1.4x0.5x0.1 0.5x0.3x0.1 Measurements L x W x D (cm) 0.008 0.55 0.118 A (cm) : rea 0.001 0.055 0.012 Volume (cm) : 99.90% 66.60% 70.00% % Reduction in A rea: 100.00% 95.80% 69.20% % Reduction in Volume: Grade 2 Full Thickness With Exposed Support Full Thickness Without Exposed Classification: Structures Support Structures Medium Medium Medium Exudate A mount: Serosanguineous Serosanguineous Serous Exudate Type: red, brown red, brown amber Exudate Color: Flat and Intact Fibrotic scar, thickened scar Distinct, outline attached Wound Margin: Large (67-100%) Large (67-100%) Large (67-100%) Granulation A mount: Red Red, Pink Red Granulation Quality: Small (1-33%) Small (1-33%) Small (1-33%) Necrotic A mount: Adherent Slough Eschar, Adherent Slough Adherent Slough Necrotic Tissue: Fat Layer (Subcutaneous Tissue): Yes Fat Layer (Subcutaneous Tissue): Yes Fat Layer (Subcutaneous Tissue): Yes Exposed Structures: Fascia: No Fascia: No Fascia: No Tendon: No Tendon: No Tendon: No Muscle: No Muscle: No Muscle: No Joint: No Joint: No Joint: No Bone: No Bone: No Bone: No None Small  (1-33%) Large (67-100%) Epithelialization: N/A Debridement - Selective/Open Wound N/A Debridement: Pre-procedure Verification/Time Out N/A 11:16 N/A Taken: N/A Lidocaine 4% Topical Solution N/A Pain Control: N/A Necrotic/Eschar, Slough N/A Tissue Debrided: N/A Non-Viable Tissue N/A Level: N/A 0.7 N/A Debridement A (sq cm): rea N/A Curette N/A Instrument: N/A Minimum N/A Bleeding: N/A Pressure N/A Hemostasis A chieved: N/A Procedure was tolerated well N/A Debridement Treatment Response: N/A 1.4x0.5x0.1 N/A Post Debridement Measurements L x W x D (cm) N/A 0.055 N/A Post Debridement Volume: (cm) Callus: Yes Scarring: Yes No Abnormalities Noted Periwound Skin Texture: Dry/Scaly: Yes Dry/Scaly: Yes Maceration: Yes Periwound Skin Moisture: Maceration: No KOHLER, AGERTON ([id number]) [id number] Page 4 of 11 No Abnormalities Noted Hemosiderin Staining: Yes Hemosiderin Staining: Yes Periwound Skin Color: No Abnormality No Abnormality No Abnormality Temperature: N/A Yes N/A Tenderness on Palpation: Compression Therapy Debridement Compression Therapy Procedures Performed: Treatment Notes Wound #18R (Metatarsal head first) Wound Laterality: Plantar, Left Cleanser Soap and Water Discharge Instruction: May shower and wash wound with dial antibacterial soap and water prior to dressing change. Wound Cleanser Discharge Instruction: Cleanse the wound with wound cleanser prior to applying a clean dressing using gauze sponges, not tissue or cotton  balls. Peri-Wound Care Topical Primary Dressing Sorbalgon AG Dressing 2x2 (in/in) Discharge Instruction: Apply to wound bed as instructed Secondary Dressing ALLEVYN Heel 4 1/2in x 5 1/2in / 10.5cm x 13.5cm Discharge Instruction: Apply over primary dressing as directed. Optifoam Non-Adhesive Dressing, 4x4 in Discharge Instruction: Apply over primary dressing as directed. Zetuvit Plus Silicone Border  Dressing 4x4 (in/in) Discharge Instruction: Apply silicone border over primary dressing as directed. Secured With Compression Wrap Compression Stockings Add-Ons Wound #22 (Lower Leg) Wound Laterality: Left, Lateral, Proximal Cleanser Soap and Water Discharge Instruction: May shower and wash wound with dial antibacterial soap and water prior to dressing change. Wound Cleanser Discharge Instruction: Cleanse the wound with wound cleanser prior to applying a clean dressing using gauze sponges, not tissue or cotton balls. Peri-Wound Care Sween Lotion (Moisturizing lotion) Discharge Instruction: Apply moisturizing lotion to the leg Topical Skintegrity Hydrogel 4 (oz) Discharge Instruction: Apply hydrogel as directed Primary Dressing Endoform 2x2 in Discharge Instruction: Moisten with Hydrogel or saline Secondary Dressing ABD Pad, 8x10 Discharge Instruction: Apply over primary dressing as directed. Secured With Elastic Bandage 4 inch (ACE bandage) Discharge Instruction: Secure with ACE bandage as directed. Compression Wrap Compression Stockings Add-Ons Wound #30 (Lower Leg) Wound Laterality: Left, Anterior CASMER, LANE ([id number]) [phone].pdf Page 5 of 11 Cleanser Soap and Water Discharge Instruction: May shower and wash wound with dial antibacterial soap and water prior to dressing change. Wound Cleanser Discharge Instruction: Cleanse the wound with wound cleanser prior to applying a clean dressing using gauze sponges, not tissue or cotton balls. Peri-Wound Care Topical Primary Dressing Sorbalgon AG Dressing 2x2 (in/in) Discharge Instruction: Apply to wound bed as instructed Secondary Dressing Zetuvit Plus Silicone Border Dressing 4x4 (in/in) Discharge Instruction: Apply silicone border over primary dressing as directed. Secured With Compression Wrap CoFlex TLC Zinc Compression Stockings Add-Ons Electronic Signature(s) Signed: 08/23/2022  11:58:24 AM By: Fredirick Maudlin MD FACS Entered By: Fredirick Maudlin on 08/23/2022 11:58:24 -------------------------------------------------------------------------------- Multi-Disciplinary Care Plan Details Patient Name: Date of Service: [name]. 08/23/2022 10:45 A M Medical Record Number: [id number] Patient Account Number: [id number] Date of Birth/Sex: Treating RN: May 01, 1951 (72 y.o. [name] Primary Care Jonea Bukowski: [name] Other Clinician: Referring Kc Summerson: Treating Breeley Bischof/Extender: [name] in Treatment: 26 Multidisciplinary Care Plan reviewed with physician Active Inactive Venous Leg Ulcer Nursing Diagnoses: Knowledge deficit related to disease process and management Potential for venous Insuffiency (use before diagnosis confirmed) Goals: Patient will maintain optimal edema control Date Initiated: 07/27/2021 Target Resolution Date: 02/23/2023 Goal Status: Active Interventions: Assess peripheral edema status every visit. Treatment Activities: Therapeutic compression applied : 07/27/2021 Notes: Wound/Skin Impairment Nursing Diagnoses: Impaired tissue integrity Knowledge deficit related to ulceration/compromised skin integrity Goals: Patient will have a decrease in wound volume by X% from date: (specify in notes) [name] ([id number]) [id number] Page 6 of 11 Date Initiated: 04/12/2021 Date Inactivated: 01/04/2022 Target Resolution Date: 04/23/2021 Goal Status: Met Patient/caregiver will verbalize understanding of skin care regimen Date Initiated: 01/04/2022 Target Resolution Date: 02/23/2023 Goal Status: Active Ulcer/skin breakdown will have a volume reduction of 30% by week 4 Date Initiated: 04/12/2021 Date Inactivated: 04/27/2021 Target Resolution Date: 04/27/2021 Goal Status: Unmet Unmet Reason: infection Ulcer/skin breakdown will have a volume reduction of 50% by week 8 Date Initiated:  04/27/2021 Date Inactivated: 06/29/2021 Target Resolution Date: 06/24/2021 Goal Status: Met Interventions: Assess patient/caregiver ability to obtain necessary supplies Assess patient/caregiver ability to perform ulcer/skin care regimen upon admission and as needed Assess ulceration(s) every visit Notes: Electronic Signature(s)  Signed: 08/23/2022 4:27:41 PM By: Blanche East RN Entered By: Blanche East on 08/23/2022 11:00:35 -------------------------------------------------------------------------------- Pain Assessment Details Patient Name: Date of Service: [name], [name] 08/23/2022 10:45 A M Medical Record Number: [id number] Patient Account Number: [id number] Date of Birth/Sex: Treating RN: 08-Jan-1951 (72 y.o. [name] Primary Care Tasheema Perrone: [name] Other Clinician: Referring Hernando Reali: Treating Ione Sandusky/Extender: [name] in Treatment: 95 Active Problems Location of Pain Severity and Description of Pain Patient Has Paino No Site Locations Rate the pain. Current Pain Level: 0 Pain Management and Medication Current Pain Management: Electronic Signature(s) Signed: 08/23/2022 4:27:41 PM By: Blanche East RN Entered By: Blanche East on 08/23/2022 10:59:20 -------------------------------------------------------------------------------- Patient/Caregiver Education Details Patient Name: Date of Service: [name] 2/29/2024andnbsp10:45 A M Medical Record Number: [id number] Patient Account Number: [id number] Date of Birth/Gender: Treating RN: Jun 27, 1950 (72 y.o. Rector, Belyeu, Tome ([id number]) [phone].pdf Page 7 of 11 Primary Care Physician: [name] Other Clinician: Referring Physician: Treating Physician/Extender: [name] in Treatment: 19 Education Assessment Education Provided To: Patient Education Topics Provided Wound Debridement: Methods:  Explain/Verbal Responses: Reinforcements needed, State content correctly Wound/Skin Impairment: Methods: Explain/Verbal Responses: Reinforcements needed, State content correctly Electronic Signature(s) Signed: 08/23/2022 4:27:41 PM By: Blanche East RN Entered By: Blanche East on 08/23/2022 11:00:57 -------------------------------------------------------------------------------- Wound Assessment Details Patient Name: Date of Service: [name]. 08/23/2022 10:45 A M Medical Record Number: [id number] Patient Account Number: [id number] Date of Birth/Sex: Treating RN: 1951-04-25 (72 y.o. [name] Primary Care Kristopher Attwood: [name] Other Clinician: Referring Dayna Alia: Treating Sriman Tally/Extender: [name] in Treatment: 12 Wound Status Wound Number: 18R Primary Diabetic Wound/Ulcer of the Lower Extremity Etiology: Wound Location: Left, Plantar Metatarsal head first Wound Open Wounding Event: Gradually Appeared Status: Date Acquired: 08/23/2020 Comorbid Glaucoma, Sleep Apnea, Hypertension, Peripheral Arterial Disease, Weeks Of Treatment: 71 History: Peripheral Venous Disease, Type II Diabetes, Gout, Osteoarthritis, Clustered Wound: No Neuropathy Photos Wound Measurements Length: (cm) 0.1 Width: (cm) 0.1 Depth: (cm) 0.1 Area: (cm) 0.008 Volume: (cm) 0.001 % Reduction in Area: 99.9% % Reduction in Volume: 100% Epithelialization: None Tunneling: No Undermining: No Wound Description Classification: Grade 2 Wound Margin: Flat and Intact Exudate Amount: Medium Exudate Type: Serosanguineous Exudate Color: red, brown DAMILOLA, LAPERRIERE ([id number]) Wound Bed Granulation Amount: Large (67-100%) Granulation Quality: Red Necrotic Amount: Small (1-33%) Necrotic Quality: Adherent Slough Foul Odor After Cleansing: No Slough/Fibrino Yes [phone].pdf Page 8 of 11 Exposed Structure Fascia Exposed: No Fat Layer  (Subcutaneous Tissue) Exposed: Yes Tendon Exposed: No Muscle Exposed: No Joint Exposed: No Bone Exposed: No Periwound Skin Texture Texture Color No Abnormalities Noted: No No Abnormalities Noted: Yes Callus: Yes Temperature / Pain Temperature: No Abnormality Moisture No Abnormalities Noted: No Dry / Scaly: Yes Maceration: No Treatment Notes Wound #18R (Metatarsal head first) Wound Laterality: Plantar, Left Cleanser Soap and Water Discharge Instruction: May shower and wash wound with dial antibacterial soap and water prior to dressing change. Wound Cleanser Discharge Instruction: Cleanse the wound with wound cleanser prior to applying a clean dressing using gauze sponges, not tissue or cotton balls. Peri-Wound Care Topical Primary Dressing Sorbalgon AG Dressing 2x2 (in/in) Discharge Instruction: Apply to wound bed as instructed Secondary Dressing ALLEVYN Heel 4 1/2in x 5 1/2in / 10.5cm x 13.5cm Discharge Instruction: Apply over primary dressing as directed. Optifoam Non-Adhesive Dressing, 4x4 in Discharge Instruction: Apply over primary dressing as directed. Zetuvit Plus Silicone Border Dressing 4x4 (in/in) Discharge Instruction: Apply silicone border  over primary dressing as directed. Secured With Compression Wrap Compression Stockings Environmental education officer) Signed: 08/23/2022 4:27:41 PM By: Blanche East RN Entered By: Blanche East on 08/23/2022 11:11:50 -------------------------------------------------------------------------------- Wound Assessment Details Patient Name: Date of Service: [name], [name] 08/23/2022 10:45 A M Medical Record Number: [id number] Patient Account Number: [id number] Date of Birth/Sex: Treating RN: 25-Feb-1951 (72 y.o. [name] Primary Care Sayed Apostol: [name] Other Clinician: Referring Niesha Bame: Treating Shannan [name]/Extender: [name] in Treatment: 47 Wound Status Wound Number: 22 Primary  Cyst Etiology: Wound Location: Left, Proximal, Lateral Lower Leg Wound Open Wounding Event: Bump Status: Date Acquired: 06/03/2021 Comorbid Glaucoma, Sleep Apnea, Hypertension, Peripheral Arterial Disease, Weeks Of Treatment: [address] ([id number]) [id number] Page 9 of 11 Weeks Of Treatment: 63 History: Peripheral Venous Disease, Type II Diabetes, Gout, Osteoarthritis, Clustered Wound: Yes Neuropathy Photos Wound Measurements Length: (cm) Width: (cm) Depth: (cm) Clustered Quantity: Area: (cm) Volume: (cm) 1.4 % Reduction in Area: 66.6% 0.5 % Reduction in Volume: 95.8% 0.1 Epithelialization: Small (1-33%) 3 Tunneling: No 0.55 Undermining: No 0.055 Wound Description Classification: Full Thickness With Exposed Support Structures Wound Margin: Fibrotic scar, thickened scar Exudate Amount: Medium Exudate Type: Serosanguineous Exudate Color: red, brown Foul Odor After Cleansing: No Slough/Fibrino Yes Wound Bed Granulation Amount: Large (67-100%) Exposed Structure Granulation Quality: Red, Pink Fascia Exposed: No Necrotic Amount: Small (1-33%) Fat Layer (Subcutaneous Tissue) Exposed: Yes Necrotic Quality: Eschar, Adherent Slough Tendon Exposed: No Muscle Exposed: No Joint Exposed: No Bone Exposed: No Periwound Skin Texture Texture Color No Abnormalities Noted: No No Abnormalities Noted: No Scarring: Yes Hemosiderin Staining: Yes Moisture Temperature / Pain No Abnormalities Noted: No Temperature: No Abnormality Dry / Scaly: Yes Tenderness on Palpation: Yes Treatment Notes Wound #22 (Lower Leg) Wound Laterality: Left, Lateral, Proximal Cleanser Soap and Water Discharge Instruction: May shower and wash wound with dial antibacterial soap and water prior to dressing change. Wound Cleanser Discharge Instruction: Cleanse the wound with wound cleanser prior to applying a clean dressing using gauze sponges, not tissue or cotton  balls. Peri-Wound Care Sween Lotion (Moisturizing lotion) Discharge Instruction: Apply moisturizing lotion to the leg Topical Skintegrity Hydrogel 4 (oz) Discharge Instruction: Apply hydrogel as directed Primary Dressing Endoform 2x2 in Discharge Instruction: Moisten with Hydrogel or saline Secondary Dressing ABD Pad, 8x10 PERCIE, CAMPISANO ([id number]) [id number] Page 10 of 11 Discharge Instruction: Apply over primary dressing as directed. Secured With Elastic Bandage 4 inch (ACE bandage) Discharge Instruction: Secure with ACE bandage as directed. Compression Wrap Compression Stockings Add-Ons Electronic Signature(s) Signed: 08/23/2022 4:27:41 PM By: Blanche East RN Entered By: Blanche East on 08/23/2022 11:12:16 -------------------------------------------------------------------------------- Wound Assessment Details Patient Name: Date of Service: [name], [name] 08/23/2022 10:45 A M Medical Record Number: [id number] Patient Account Number: [id number] Date of Birth/Sex: Treating RN: [birth year] (72 y.o. [name] Primary Care Adriannah Steinkamp: [name] Other Clinician: Referring Micharl Helmes: Treating Teriana Danker/Extender: [name] in Treatment: 71 Wound Status Wound Number: 30 Primary Abrasion Etiology: Wound Location: Left, Anterior Lower Leg Wound Open Wounding Event: Shear/Friction Status: Date Acquired: 06/21/2022 Comorbid Glaucoma, Sleep Apnea, Hypertension, Peripheral Arterial Disease, Weeks Of Treatment: 9 History: Peripheral Venous Disease, Type II Diabetes, Gout, Osteoarthritis, Clustered Wound: No Neuropathy Photos Wound Measurements Length: (cm) 0.5 Width: (cm) 0.3 Depth: (cm) 0.1 Area: (cm) 0.118 Volume: (cm) 0.012 % Reduction in Area: 70% % Reduction in Volume: 69.2% Epithelialization: Large (67-100%) Tunneling: No Undermining: No Wound Description Classification: Full Thickness Without Exposed  Support Structures Wound Margin:  Distinct, outline attached Exudate Amount: Medium Exudate Type: Serous Exudate Color: amber Foul Odor After Cleansing: No Slough/Fibrino Yes Wound Bed Granulation Amount: Large (67-100%) Exposed Structure Granulation Quality: Red Fascia Exposed: No Necrotic Amount: Small (1-33%) Fat Layer (Subcutaneous Tissue) Exposed: Yes Necrotic Quality: Adherent Slough Tendon Exposed: No Muscle Exposed: No Joint Exposed: No Bone Exposed: No [name] ([id number] Page 11 of 11 Periwound Skin Texture Texture Color No Abnormalities Noted: Yes No Abnormalities Noted: No Hemosiderin Staining: Yes Moisture No Abnormalities Noted: No Temperature / Pain Maceration: Yes Temperature: No Abnormality Treatment Notes Wound #30 (Lower Leg) Wound Laterality: Left, Anterior Cleanser Soap and Water Discharge Instruction: May shower and wash wound with dial antibacterial soap and water prior to dressing change. Wound Cleanser Discharge Instruction: Cleanse the wound with wound cleanser prior to applying a clean dressing using gauze sponges, not tissue or cotton balls. Peri-Wound Care Topical Primary Dressing Sorbalgon AG Dressing 2x2 (in/in) Discharge Instruction: Apply to wound bed as instructed Secondary Dressing Zetuvit Plus Silicone Border Dressing 4x4 (in/in) Discharge Instruction: Apply silicone border over primary dressing as directed. Secured With Compression Wrap CoFlex TLC Zinc Compression Stockings Add-Ons Electronic Signature(s) Signed: 08/23/2022 4:27:41 PM By: Blanche East RN Entered By: Blanche East on 08/23/2022 11:12:54 -------------------------------------------------------------------------------- Vitals Details Patient Name: Date of Service: [name]. 08/23/2022 10:45 A M Medical Record Number: [id number] Patient Account Number: [id number] Date of Birth/Sex: Treating RN: [birth year] (72 y.o.  [name] Primary Care Nila Winker: [name] Other Clinician: Referring Melaina Howerton: Treating Brunilda Eble/Extender: [name] in Treatment: 58 Vital Signs Time Taken: 10:58 Temperature (F): 98.2 Height (in): 74 Pulse (bpm): 56 Weight (lbs): 238 Respiratory Rate (breaths/min): 16 Body Mass Index (BMI): 30.6 Blood Pressure (mmHg): 147/90 Capillary Blood Glucose (mg/dl): 178 Reference Range: 80 - 120 mg / dl Electronic Signature(s) Signed: 08/23/2022 4:27:41 PM By: Blanche East RN Entered By: Blanche East on 08/23/2022 10:59:13

## 2022-08-30 ENCOUNTER — Encounter (HOSPITAL_BASED_OUTPATIENT_CLINIC_OR_DEPARTMENT_OTHER): Payer: Medicare Other | Attending: General Surgery | Admitting: General Surgery

## 2022-08-30 DIAGNOSIS — L97522 Non-pressure chronic ulcer of other part of left foot with fat layer exposed: Secondary | ICD-10-CM | POA: Insufficient documentation

## 2022-08-30 DIAGNOSIS — E1122 Type 2 diabetes mellitus with diabetic chronic kidney disease: Secondary | ICD-10-CM | POA: Diagnosis not present

## 2022-08-30 DIAGNOSIS — E11622 Type 2 diabetes mellitus with other skin ulcer: Secondary | ICD-10-CM | POA: Diagnosis not present

## 2022-08-30 DIAGNOSIS — E1151 Type 2 diabetes mellitus with diabetic peripheral angiopathy without gangrene: Secondary | ICD-10-CM | POA: Insufficient documentation

## 2022-08-30 DIAGNOSIS — N183 Chronic kidney disease, stage 3 unspecified: Secondary | ICD-10-CM | POA: Insufficient documentation

## 2022-08-30 DIAGNOSIS — I890 Lymphedema, not elsewhere classified: Secondary | ICD-10-CM | POA: Diagnosis not present

## 2022-08-30 DIAGNOSIS — L97822 Non-pressure chronic ulcer of other part of left lower leg with fat layer exposed: Secondary | ICD-10-CM | POA: Diagnosis not present

## 2022-08-30 DIAGNOSIS — E11621 Type 2 diabetes mellitus with foot ulcer: Secondary | ICD-10-CM | POA: Diagnosis not present

## 2022-08-30 DIAGNOSIS — I129 Hypertensive chronic kidney disease with stage 1 through stage 4 chronic kidney disease, or unspecified chronic kidney disease: Secondary | ICD-10-CM | POA: Insufficient documentation

## 2022-08-30 DIAGNOSIS — I87322 Chronic venous hypertension (idiopathic) with inflammation of left lower extremity: Secondary | ICD-10-CM | POA: Diagnosis present

## 2022-08-30 DIAGNOSIS — L859 Epidermal thickening, unspecified: Secondary | ICD-10-CM | POA: Insufficient documentation

## 2022-09-01 NOTE — Progress Notes (Signed)
[name] ([id number]) [id number] Page 1 of 20 Visit Report for 08/30/2022 Chief Complaint Document Details Patient Name: Date of Service: [name], [name] 08/30/2022 10:45 A M Medical Record Number: [id number] Patient Account Number: [id number] Date of Birth/Sex: Treating RN: 1951-03-14 (72 y.o. M) Primary Care Provider: [name] Other Clinician: Referring Provider: Treating Provider/Extender: [name] in Treatment: 72 Information Obtained from: Patient Chief Complaint Left leg and foot ulcers 04/12/2021; patient is here for wounds on his left lower leg and left plantar foot over the first metatarsal head Electronic Signature(s) Signed: 08/30/2022 12:43:14 PM By: Fredirick Maudlin MD FACS Entered By: Fredirick Maudlin on 08/30/2022 12:43:13 -------------------------------------------------------------------------------- Debridement Details Patient Name: Date of Service: [name]. 08/30/2022 10:45 A M Medical Record Number: [id number] Patient Account Number: [id number] Date of Birth/Sex: Treating RN: [birth year] (72 y.o. [name] Primary Care Provider: [name] Other Clinician: Referring Provider: Treating Provider/Extender: [name] in Treatment: 72 Debridement Performed for Assessment: Wound #22 Left,Proximal,Lateral Lower Leg Performed By: Physician Fredirick Maudlin, MD Debridement Type: Debridement Level of Consciousness (Pre-procedure): Awake and Alert Pre-procedure Verification/Time Out Yes - 11:40 Taken: Start Time: 11:41 T Area Debrided (L x W): otal 1.2 (cm) x 0.4 (cm) = 0.48 (cm) Tissue and other material debrided: Non-Viable, Slough, Slough Level: Non-Viable Tissue Debridement Description: Selective/Open Wound Instrument: Curette Bleeding: Moderate Hemostasis Achieved: Pressure Response to Treatment: Procedure was tolerated well Level of Consciousness (Post- Awake  and Alert procedure): Post Debridement Measurements of Total Wound Length: (cm) 1.2 Width: (cm) 0.4 Depth: (cm) 0.1 Volume: (cm) 0.038 Character of Wound/Ulcer Post Debridement: Requires Further Debridement Post Procedure Diagnosis Same as Pre-procedure Notes Scribed for Dr. Celine Ahr by Blanche East, RN Electronic Signature(s) Signed: 08/30/2022 1:02:33 PM By: Fredirick Maudlin MD FACS Signed: 08/30/2022 5:01:03 PM By: Blanche East RN Entered By: Blanche East on 08/30/2022 11:41:49 [name] ([id number]) [id number] Page 2 of 20 -------------------------------------------------------------------------------- Debridement Details Patient Name: Date of Service: [name], [name] 08/30/2022 10:45 A M Medical Record Number: [id number] Patient Account Number: [id number] Date of Birth/Sex: Treating RN: July 08, 1950 (72 y.o. [name] Primary Care Provider: [name] Other Clinician: Referring Provider: Treating Provider/Extender: [name] in Treatment: 72 Debridement Performed for Assessment: Wound #18R Left,Plantar Metatarsal head first Performed By: Physician Fredirick Maudlin, MD Debridement Type: Debridement Severity of Tissue Pre Debridement: Fat layer exposed Level of Consciousness (Pre-procedure): Awake and Alert Pre-procedure Verification/Time Out Yes - 11:40 Taken: Start Time: 11:41 T Area Debrided (L x W): otal 2.5 (cm) x 3.5 (cm) = 8.75 (cm) Tissue and other material debrided: Non-Viable, Slough, Slough Level: Non-Viable Tissue Debridement Description: Selective/Open Wound Instrument: Curette Bleeding: Moderate Hemostasis Achieved: Pressure Response to Treatment: Procedure was tolerated well Level of Consciousness (Post- Awake and Alert procedure): Post Debridement Measurements of Total Wound Length: (cm) 2.5 Width: (cm) 3.5 Depth: (cm) 0.1 Volume: (cm) 0.687 Character of Wound/Ulcer Post Debridement:  Requires Further Debridement Severity of Tissue Post Debridement: Fat layer exposed Post Procedure Diagnosis Same as Pre-procedure Notes Scribed for Dr. Celine Ahr by Blanche East, RN Electronic Signature(s) Signed: 08/30/2022 1:02:33 PM By: Fredirick Maudlin MD FACS Signed: 08/30/2022 5:01:03 PM By: Blanche East RN Entered By: Blanche East on 08/30/2022 12:03:27 -------------------------------------------------------------------------------- HPI Details Patient Name: Date of Service: [name]. 08/30/2022 10:45 A M Medical Record Number: [id number] Patient Account Number: [id number] Date of Birth/Sex: Treating RN: [birth year] (72 y.o. M) Primary Care Provider: [name] Other Clinician: Referring  Provider: Treating Provider/Extender: [name] in Treatment: 72 History of Present Illness HPI Description: 10/11/17; [name] is a 72 year old man who tells me that in 2015 he slipped down the latter traumatizing his left leg. He developed a wound in the same spot the area that we are currently looking at. He states this closed over for the most part although he always felt it was somewhat unstable. In 2016 he hit the same area with the door of his car had this reopened. He tells me that this is never really closed although sometimes an inflow it remains open on a constant basis. He has not been using any specific dressing to this except for topical antibiotics the nature of which were not really sure. His primary doctor did send him to see Dr. [name] of interventional cardiology. He underwent an angiogram on 08/06/17 and he underwent a PTA and directional atherectomy of the lesser distal SFA and popliteal arteries which resulted in brisk improvement in blood flow. It was noted that he had 2 vessel runoff through the anterior tibial and peroneal. He is also been to see vascular and interventional radiologist. He was not felt to have any significant superficial  venous insufficiency. Presumably is not a candidate for any ablation. It was suggested he come here for wound care. The patient is a type II diabetic on insulin. He also has a history of venous insufficiency. ABIs on the left were noncompressible in our clinic [name], [name] ([id number]) [id number] Page 3 of 20 10/21/17; patient we admitted to the clinic last week. 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 We put Iodosorb on him after an aggressive debridement and 3 layer compression. He complained of pain in his ankle and itching with is skin in fact he scratched the area on the medial calf superiorly at the rim of our wraps and he has 2 small open areas in that location today which are new. I changed his primary dressing today to silver collagen. As noted he is already had revascularization and does not have any significant superficial venous insufficiency that would be amenable to ablation 10/28/17; patient admitted to the clinic 2 weeks ago. He has a smaller Wound. Scratch injury from last week revealed. There is large wound over the tibial area. This is smaller. Granulation looks healthy. No need for debridement. 11/04/17; the wound on the left lateral calf looks better. Improved dimensions. Surface of this looks better. We've been maintaining him and Kerlix Coban wraps. He finds this much more comfortable. Silver collagen dressing 11/11/17; left lateral Wound continues to look healthy be making progress. Using a #5 curet I removed removed nonviable skin from the surface of the wound and then necrotic debris from the wound surface. Surface of the wound continues to look healthy. He also has an open area on the left great toenail bed. We've been using topical antibiotics. 11/19/17; left anterior lateral wound continues to look healthy but it's not closed. He also had a small wound above this on the left  leg Initially traumatic wounds in the setting of significant chronic venous insufficiency and stasis dermatitis 11/25/17; left anterior wounds superiorly is closed still a small wound inferiorly. 12/02/17; left anterior tibial area. Arrives today with adherent callus. Post debridement clearly not completely closed. Hydrofera Blue under 3 layer compression. 12/09/17; left anterior tibia. Circumferential eschar however the wound bed looks stable to improved. We've been using Hydrofera  Blue under 3 layer compression 12/17/17; left anterior tibia. Apparently this was felt to be closed however when the wrap was taken off there is a skin tear to reopen wounds in the same area we've been using Hydrofera Blue under 3 layer compression 12/23/17 left anterior tibia. Not close to close this week apparently the [facility] was stuck to this again. Still circumferential eschar requiring debridement. I put a contact layer on this this time under the Hydrofera Blue 12/31/17; left anterior tibia. Wound is better slight amount of hyper-granulation. Using Hydrofera Blue over Adaptic. 01/07/18; left anterior tibia. The wound had some surface eschar however after this was removed he has no open wound.he was already revascularized by Dr. [name] when he came to our clinic with atherectomy of the left SFA and popliteal artery. He was also sent to interventional radiology for venous reflux studies. He was not felt to have significant reflux but certainly has chronic venous changes of his skin with hemosiderin deposition around this area. He will definitely need to lubricate his skin and wear compression stocking and I've talked to him about this. READMISSION 05/26/2018 This is a now 72 year old man we cared for with traumatic wounds on his left anterior lower extremity. He had been previously revascularized during that admission by Dr. [name]. Apparently in follow-up Dr. [name] noted that he had deterioration in his arterial status.  He underwent a stent placement in the distal left SFA on 04/22/2018. 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 He went back to the angiography suite on 04/30/2018 he underwent PTA and balloon angioplasty of the occluded left mid anterior tibial artery, thrombotic occlusion went from 100 to 0% which reconstitutes the posterior tibial artery. He had thrombectomy and aspiration of the peroneal artery. The stent placed in the distal SFA left SFA was still occluded. 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 He has been recuperating at [facility] in [facility] after the most recent hospitalization. He is going home tomorrow. He tells me that 2 weeks ago he traumatized the tip of his left fifth toe. He came in urgently for our review of this. This was a history of before I noted that Dr. [name] had already noted dry gangrenous changes of the left fifth toe 06/09/2018; 2-week follow-up. I did contact Dr. [name] after his last appointment and he apparently saw 1 of Dr. Irven Shelling colleagues the next day. He does not follow-up with Dr. [name] himself until Thursday of this week. 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 Nevertheless there is no evidence of infection no drainage and no pain. He had a new area that this week when we were signing him in today on the left anterior mid tibia area, this is in close proximity to the previous wound we have dealt with in this clinic. 06/23/2018; 2-week follow-up. I did not receive a recent note from Dr. [name] to review today. Our office is trying to obtain this. He is apparently not planning to do further vascular interventions and wondered about compression to try and help with the  patient's chronic venous insufficiency. However we are also concerned about the arterial flow. He arrives in clinic today with a new area  on the left third toe. The areas on the calf/anterior tibia are close to closing. The left fifth toe is still mummified using Betadine. -In reviewing things with the patient he has what sounds like claudication with mild to moderate amount of activity. 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 I prescribed Levaquin over the phone while we attempted to arrange a plan of care. However the patient called yesterday to report he had low-grade fever and he came in today acutely. There is been a marked deterioration in the left third toe with spreading cellulitis up into the dorsal left foot. He was referred to the emergency room. Readmission: 06/29/2020 patient presents today for reevaluation here in our clinic he was previously treated by Dr. Dellia Nims at the latter part of 2019 in 2 the beginning of 2020. Subsequently we have not seen him since that time in the interim he did have evaluation with vein and vascular specialist specifically Dr. Anice Paganini who did perform quite extensive work for a left femoral to anterior tibial artery bypass. With that being said in the interim the patient has developed significant lymphedema and has wounds that he tells me have really never healed in regard to the incision site on the left leg. He also has multiple wounds on the feet for various reasons some of which is that he tends to pick at his feet. Fortunately there is no signs of active infection systemically at this time he does have some wounds that are little bit deeper but most are fairly superficial he seems to have good blood flow and overall everything appears to be healthy I see no bone exposed and no obvious signs of osteomyelitis. I do not know that he necessarily needs a x-ray at this point although that something we could  consider depending on how things progress. The patient does have a history of lymphedema, diabetes, this is type II, chronic kidney disease stage III, hypertension, and history of peripheral vascular disease. 07/05/2020; patient admitted last week. Is a patient I remember from 2019 he had a spreading infection involving the left foot and we sent him to the hospital. 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 He subsequently had a left femoral to anterior tibial bypass by Dr.Cain vein and vascular. He also has severe lymphedema with chronic skin changes related to that on the left leg. The most problematic area that was new today was on the left medial great toe. This was apparently a small area last week there was purulent drainage which our intake nurse cultured. Also areas on the left medial foot and heel left lateral foot. He has 2 areas on the left medial calf left lateral calf in the setting of the severe lymphedema. 07/13/2020 on evaluation today patient appears to be doing better in my opinion compared to his last visit. The good news is there is no signs of active infection systemically and locally I do not see any signs of infection either. He did have an x-ray which was negative that is great news he had a culture which showed MRSA but at the same time he is been on the doxycycline which has helped. I do think we may want to extend this for 7 additional days 1/25; patient admitted to the clinic a few weeks ago. 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 We have been putting him under compression his  edema control is a lot better but he is severe verricused skin on the left leg. He is really done quite well he still has an open area on the left medial calf and the left medial first metatarsal head. We have been using silver collagen on the leg silver alginate on the foot 07/27/2020 upon evaluation today patient  appears to be doing decently well in regard to his wounds. He still has a lot of dry skin on the left leg. Some of this is starting to peel back and I think he may be able to have them out by removing some that today. Fortunately there is no signs of active infection at this time on the left leg although on the right leg he does appear to have swelling and erythema as well as some mild warmth to touch. This does have been concerned about the possibility of cellulitis although within the differential diagnosis I do think that potentially a DVT has to be at least considered. We need to rule that out before proceeding would just call in the cellulitis. Especially since he is having pain in the posterior aspect of his calf muscle. 2/8; the patient had seen sparingly. He has severe skin changes of chronic lymphedema in the left leg thickened hyperkeratotic verrucous skin. He has an open wound on the medial part of the left first met head left mid tibia. He also has a rim of nonepithelialized skin in the anterior mid tibia. He brought in the AmLactin lotion that was been prescribed although I am not sure under compression and its utility. [name], [name] ([id number]) [id number] Page 4 of 20 There concern about cellulitis on the right lower leg the last time he was here. He was put on on antibiotics. His DVT rule out was negative. The right leg looks fine he is using his stocking on this area 08/10/2020 upon evaluation today patient appears to be doing well with regard to his leg currently. He has been tolerating the dressing changes without complication. Fortunately there is no signs of active infection which is great news. Overall very pleased with where things stand. 2/22; the patient still has an area on the medial part of the left first met his head. This looks better than when I last saw this earlier this month he has a rim of epithelialization but still some surface debris.  Mostly everything on the left leg is healed. There is still a vulnerable in the left mid tibia area. 08/30/2020 upon evaluation today patient appears to be doing much better in regard to his wounds on his foot. 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 Nonetheless I think we will address that today I Minna send in a prescription for him in that regard. Overall though there does not appear to be any signs of significant worsening. 09/07/2020 on evaluation today patient's wounds over his left foot appear to be doing excellent. I do not see any signs of infection there is some callus buildup this can require debridement for certain but overall I feel like he is managing quite nicely. He still using the AmLactin cream which has been beneficial for him as well. 3/22; left foot wound is closed. There is no open area here. He is using ammonium lactate lotion to the lower extremities to help exfoliate dry cracked skin. He has compression stockings from [facility] in [place]. The wound on the medial part of his left first met head is healed today. READMISSION 04/12/2021 Mr. Strehlow is a patient we know fairly well he had a prolonged stay in clinic in 2019 with wounds on his left lateral and left anterior lower extremity in the setting of chronic venous insufficiency. More recently he was here earlier this year with predominantly an area on his left foot first metatarsal head plantar and he says the plantar foot broke down on its not long after we discharged him but he did not come back here. The last few months areas of broken down on his left anterior and again the left lateral lower extremity. The leg itself is very swollen chronically enlarged a lot of hyperkeratotic dry Berry Q skin in the left lower leg. His edema extends well into the thigh. He was seen by Dr. Donzetta Matters.  He had ABIs on 03/02/2021 showing an ABI on the right of 1 with a TBI of 0.72 his ABI in the left at 1.09 TBI of 0.99. Monophasic and biphasic waveforms on the right. On the left monophasic waveforms were noted he went on to have an angiogram on 03/27/2021 this showed the aortic aortic and iliac segments were free of flow-limiting stenosis the left common femoral vein to evaluate the left femoral to anterior tibial artery bypass was unobstructed the bypass was patent without any areas of stenosis. We discharged the patient in bilateral juxta lite stockings but very clearly that was not sufficient to control the swelling and maintain skin integrity. He is clearly going to need compression pumps. 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 This is fortunate because he is on his feet for long periods of time. 10/27; patient comes in with our intake nurse reporting copious amount of green drainage from the left anterior mid tibia the left dorsal foot and to a lesser extent the left medial mid tibia. We left the compression wrap on all week for the amount of edema in his left leg is quite a bit better. We use silver alginate as the primary dressing 11/3; edema control is good. Left anterior lower leg left medial lower leg and the plantar first metatarsal head. The left anterior lower leg required debridement. Deep tissue culture I did of this wound showed MRSA I put him on 10 days of doxycycline which she will start today. We have him in compression wraps. He has a security card and [employer] however he is retiring on November 15. We will need to then get him into a better offloading boot for the left foot perhaps a total contact cast 11/10; edema control is quite good. Left anterior and left medial lower leg wounds in the setting of chronic venous insufficiency and lymphedema. He also has a substantial area over the left plantar first metatarsal head. I treated  him for MRSA that we identified on the major wound on the left anterior mid tibia with doxycycline and gentamicin topically. He has significant hypergranulation on the left plantar foot wound. The patient is a diabetic but he does not have significant PAD 11/17; edema control is quite good. Left anterior and left medial lower leg wounds look better. The really concerning area remains the area on the left plantar first metatarsal head. He has a rim of epithelialization. He has been using a surgical shoe The patient is now retired from a a [employer] I  have gone over with him the need to offload this area aggressively. Starting today with a forefoot off loader but . possibly a total contact cast. 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 Arrives with a very significant odor from I think all of the wounds on the left leg and the left foot. Because of this I did not put a total contact cast on him today but will could still consider this. His wife was having cataract surgery which is the reason he missed the appointment 12/6. I saw this man 5 days ago with a swelling below the popliteal fossa. I thought he actually might have a Baker's cyst however the DVT rule out study that we could arrange right away was negative the technician told me this was not a ruptured Baker's cyst. We attempted to get this aspirated by under ultrasound guidance in interventional radiology however all they did was an ultrasound however it shows an extensive fluid collection 62 x 8 x 9.4 in the left thigh and left calf. The patient states he thinks this started 8 days ago or so but he really is not complaining of any pain, fever or systemic symptoms. He has not ha 12/20; after some difficulty I managed to get the patient into see Dr. Donzetta Matters. Eventually he was taken into the hospital and had a drain put in the fluid  collection below his left knee posteriorly extending into the posterior thigh. He still has the drain in place. Culture of this showed moderate staff aureus few Morganella and few Klebsiella he is now on doxycycline and ciprofloxacin as suggested by infectious disease he is on this for a month. The drain will remain in place until it stops draining 12/29; he comes in today with the 1 wound on his left leg and the area on the left plantar first met head significantly smaller. Both look healthy. He still has the drain in the left leg. He says he has to change this daily. Follows up with Dr. Donzetta Matters on January 11. 06/29/2021; the wounds that I am following on the left leg and left first met head continued to be quite healthy. However the area where his inferior drain is in place had copious amounts of drainage which was green in color. The wound here is larger. Follows up with Dr. Gwenlyn Saran of vein and vascular his surgeon next week as well as infectious disease. He remains on ciprofloxacin and doxycycline. He is not complaining of excessive pain in either one of the drain areas 1/12; the patient saw vascular surgery and infectious disease. Vascular surgery has left the drain in place as there was still some notable drainage still see him back in 2 weeks. Dr. Velna Ochs stop the doxycycline and ciprofloxacin and I do not believe he follows up with them at this point. Culture I did last week showed both doxycycline resistant MRSA and Pseudomonas not sensitive to ciprofloxacin although only in rare titers 1/19; the patient's wound on the left anterior lower leg is just about healed. We have continued healing of the area that was medially on the left leg. Left first plantar metatarsal head continues to get smaller. The major problem here is his 2 drain sites 1 on the left upper calf and lateral thigh. There is purulent drainage still from the left lateral thigh. I gave him antibiotics last week but  we still have  recultured. He has the drain in the area I think this is eventually going to have to come out. I suspect there will be a connecting wound to heal here perhaps with improved VAc 1/26; the patient had his drain removed by vein and vascular on 1/25/. This was a large pocket of fluid in his left thigh that seem to tunnel into his left upper calf. 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 His mention his Penrose drain was removed today. He now has a tunneling wound on his left calf and left thigh. Both of these probe widely towards each other although I cannot really prove that they connect. Both wounds on his lower leg anteriorly are YARI, MESKE ([id number]) [id number] Page 5 of 20 closed and his area over the first metatarsal head on his right foot continues to improve. We are using Hydrofera Blue here. He also saw infectious disease culture of the abscess they noted was polymicrobial with MRSA, Morganella and Klebsiella he was treated with doxycycline and ciprofloxacin for 4 weeks ending on 07/03/2021. They did not recommend any further antibiotics. Notable that while he still had the Penrose drain in place last week he had purulent drainage coming out of the inferior IandD site this grew [facility], MRSA and Pseudomonas but there does not appear to be any active infection in this area today with the drain out and he is not systemically unwell 2/2; with regards to the drain sites the superior one on the thigh actually is closed down the one on the upper left lateral calf measures about 8 and half centimeters which is an improvement seems to be less prominent although still with a lot of drainage. The only remaining wound is over the first metatarsal head on the left foot and this looks to be continuing to improve with Hydrofera Blue. 2/9; the area on his plantar left foot continues to contract. Callus around the wound edge. The drain sites specifically  have not come down in depth. We put the wound VAC on Monday he changed the canister late last night our intake nurse reported a pocket of fluid perhaps caused by our compression wraps 2/16; continued improvement in left foot plantar wound. drainage site in the calf is not improved in terms of depth (wound vac) 2/23; continued improvement in the left foot wound over the first metatarsal head. With regards to the drain sites the area on his thigh laterally is healed however the open area on his calf is small in terms of circumference by still probes in by about 15 cm. Within using the wound VAC. Hydrofera Blue on his foot 08/24/2021: The left first metatarsal head wound continues to improve. The wound bed is healthy with just some surrounding callus. Unfortunately the open drain site on his calf remains open and tunnels at least 15 cm (the extent of a Q-tip). This is despite several weeks of wound VAC treatment. Based on reading back through the notes, there has been really no significant change in the depth of the wound, although the orifice is smaller and the more cranial wound on his thigh has closed. I suspect the tunnel tracks nearly all the way to this location. 08/31/2021: Continued improvement in the left first metatarsal head wound. There has been absolutely no improvement to the long tunnel from his open drain site on his calf. We have tried to get him into see vascular surgery sooner to consider the possibility of simply  filleting the tract open and allowing it to heal from the bottom up, likely with a wound VAC. They have not yet scheduled a sooner appointment than his current mid April 09/14/2021: He was seen by vascular surgery and they took him to the operating room last week. They opened a portion of the tunnel, but did not extend the entire length of the known open subcutaneous tract. I read Dr. Claretha Cooper operative note and it is not clear from that documentation why only a portion of the  tract was opened. The heaped up granulation tissue was curetted and removed from at least some portion of the tract. They did place a wound VAC and applied an Unna boot to the leg. The ulcer on his left first metatarsal head is smaller today. The bed looks good and there is just a small amount of surrounding callus. 09/21/2021: The ulcer on his left first metatarsal head looks to be stalled. There is some callus surrounding the wound but the wound bed itself does not appear particularly dynamic. The tunnel tract on his lateral left leg seems to be roughly the same length or perhaps slightly smaller but the wound bed appears healthy with good granulation tissue. He opened up a new wound on his medial thigh and the site of a prior surgical incision. He says that he did this unconsciously in his sleep by scratching. 09/28/2021: Unfortunately, the ulcer on his left first metatarsal head has extended underneath the callus toward the dorsum of his foot. The medial thigh wounds are roughly the same. The tunnel on his lateral left leg continues to be problematic; it is longer than we are able to actually probe with a Q-tip. I am still not certain as to why Dr. Donzetta Matters did not open this up entirely when he took the patient to the operating room. We will likely be back in the same situation with just a small superficial opening in a long unhealed tract, as the open portion is granulating in nicely. 10/02/2021: The patient was initially scheduled for a nurse visit, but we are also applying a total contact cast today. The plantar foot wound looks clean without significant accumulated callus. We have been applying Prisma silver collagen to the site. 10/05/2021: The patient is here for his first total contact cast change. We have tried using gauze packing strips in the tunnel on his lateral leg wound, but this does not seem to be working any better than the white VAC foam. The foot ulcer looks about the same with minimal  periwound callus. Medial thigh wound is clean with just some overlying eschar. 10/12/2021: The plantar foot wound is stable without any significant accumulation of periwound callus. The surface is viable with good granulation tissue. The medial thigh wounds are much smaller and are epithelializing. On the other hand, he had purulent drainage coming from the tunnel on his lateral leg. He does go back to see Dr. Donzetta Matters next week and is planning to ask him why the wound tunnel was not completely opened at the time of his most recent operation. 10/19/2021: The plantar foot wound is markedly improved and has epithelial tissue coming through the surface. The medial thigh wounds are nearly closed with just a tiny open area. He did see Dr. Donzetta Matters earlier this week and apparently they did discuss the possibility of opening the sinus tract further and enabling a wound VAC application. Apparently there are some limits as to what Dr. Donzetta Matters feels comfortable opening, presumably in relationship to  his bypass graft. I think if we could get the tract open to the level of the popliteal fossa, this would greatly aid in her ability to get this chart closed. That being said, however, today when I probed the tract with a Q-tip, I was not able to insert the entirety of the Q-tip as I have on previous occasions. The tunnel is shorter by about 4 cm. The surface is clean with good granulation tissue and no further episodes of purulent drainage. 10/30/2021: Last week, the patient underwent surgery and had the long tract in his leg opened. There was a rind that was debrided, according to the operative report. His medial thigh ulcers are closed. The plantar foot wound is clean with a good surface and some built up surrounding callus. 11/06/2021: The overall dimensions of the large wound on his lateral leg remain about the same, but there is good granulation tissue present and the tunneling is a little bit shorter. He has a new wound on his  anterior tibial surface, in the same location where he had a similar lesion in the past. The plantar foot wound is clean with some buildup surrounding callus. Just toward the medial aspect of his foot, however, there is an area of darkening that once debrided, revealed another opening in the skin surface. 11/13/2021: The anterior tibial surface wound is closed. The plantar foot wound has some surrounding callus buildup. The area of darkening that I debrided last week and revealed an opening in the skin surface has closed again. The tunnel in the large wound on his lateral leg has come in by about 3 cm. There is healthy granulation tissue on the entire wound surface. 11/23/2021: The patient was out of town last week and did wet-to-dry dressings on his large wound. 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 Unfortunately, he picked open the wound on his left medial thigh. He says that it was itching and he just could not stop scratching it until it was open again. The wound on his plantar foot is smaller and has not accumulated a tremendous amount of callus. The lateral leg wound is shallower and the tunnel has also decreased in depth. There is just a little bit of slough accumulation on the surface. 11/30/2021: Another portion of his left medial thigh has been opened up. All of these wounds are fairly superficial with just a little bit of slough and eschar accumulation. The wound on his plantar foot is almost closed with just a bit of eschar and periwound callus accumulation. The lateral leg wound is nearly flush with the surrounding skin and the tunnel is markedly shallower. 12/07/2021: There is just 1 open area on his left medial thigh. It is clean with just a little bit of perimeter eschar. The wound on his plantar foot continues to contract and just has some eschar and periwound callus accumulation. The lateral leg wound is closing at the  more distal aspect and the tunnel is smaller. The surface is nearly flush with the surrounding skin and it has a good bed of granulation tissue. 12/14/2021: The thigh and foot wounds are closed. The lateral leg wound has closed over approximately half of its length. The tunnel continues to contract and the surface is now flush with the surrounding skin. The wound bed has robust granulation tissue. 12/22/2021: The thigh and foot wounds have reopened. The foot wound has a lot of callus accumulation around and  over it. The thigh wound is tiny with just a little bit of slough in the wound bed. The lateral leg wound continues to contract. His vascular surgeon took the wound VAC off earlier in the week and the patient has been doing wet-to-dry dressings. There is a little slough accumulation on the surface. The tunnel is about 3 cm in depth at this point. [name], [name] ([id number]) [id number] Page 6 of 20 12/28/2021: The thigh wound is closed again. The foot wound has some callus that subsequently has peeled back exposing just a small slit of a wound. The lateral leg wound Is down to about half the size that it originally was and the tunnel is down to about half a centimeter in depth. 01/04/2022: The thigh wound remains closed. The foot wound has heavy callus overlying the wound site. Once this was debrided, the wound was found to be closed. The lateral leg wound is smaller again this week and very superficial. No tunnel could be identified. 01/12/2022: The thigh and foot wounds both remain closed. The lateral leg wound is now nearly flush with the skin surface. There is good granulation tissue present with a light layer of slough. 01/19/2022: Due to the way his wrap was placed, the patient did not change the dressing on his thigh at all and so the foam was saturated and his skin is macerated. There is a light layer of slough on the wound surface. The underlying granulation tissue is  robust and healthy-appearing. He has heavy callus buildup at the site of his first metatarsal head wound which is still healed. 02/01/2022: He has been in silver alginate. When he removed the dressing from his thigh wound, however, some leg, superficially reopening a portion of the wound that had healed. In addition, underneath the callus at his left first metatarsal head, there appears to be a blister and the wound appears to be open again. 02/08/2022: The lateral leg wound has contracted substantially. There is eschar and a light layer of slough present. He says that it is starting to pull and is uncomfortable. On inspection, there is some puckering of the scar and the eschar is quite dry; this may account for his symptoms. On his first metatarsal head, the wound is much smaller with just some eschar on the surface. The callus has not reaccumulated. He reports that he had a blister come up on his medial thigh wound at the distal aspect. It popped and there is now an opening in his skin again. Looking back through his [place] of wound photos, there is what looks like a permanent suture just deep to this location and it may be trying to erode through. We have been using silver alginate on his wounds. 02/15/2022: The lateral leg wound is about half the size it was last week. It is clean with just a little perimeter eschar and light slough. The wound on his first metatarsal head is about the same with heavy callus overlying it. The medial thigh wound is closed again. He does have some skin changes on the top of his foot that looks potentially yeast related. 02/22/2022: The skin on the top of his foot improved with the use of a topical antifungal. The lateral leg wound continues to contract and is again smaller this week. There is a little bit of slough and eschar on the surface. The first metatarsal head wound is a little bit smaller but has reaccumulated a thick callus over the top. He decided to try to  trim his toenail and ultimately took the entire nail off of his left great toe. 03/02/2022: His lateral leg wound continues to improve, as does the wound on his left great toe. Unfortunately, it appears that somehow his foot got wet and moisture seeped in through the opening causing his skin to lift. There is a large wound now overlying his first metatarsal on both the plantar, medial, and dorsal portion of his foot. There is necrotic tissue and slough present underneath the shaggy macerated skin. 03/08/2022: The lateral leg wound is smaller again today. There is just a light layer of slough and eschar on the surface. The great toe wound is smaller again today. The first metatarsal wound is a little bit smaller today and does not look nearly as necrotic and macerated. There is still slough and nonviable tissue present. 03/15/2022: The lateral leg wound is narrower and just has a little bit of light slough buildup. The first metatarsal wound still has a fair amount of moisture affecting the periwound skin. The great toe wound is healed. 03/22/2022: The lateral leg wound is now isolated to just at the level of his knee. There is some eschar and slough accumulation. The first metatarsal head wound has epithelialized tremendously and is about half the size that it was last week. He still has some maceration on the top of his foot and a fungal odor is present. 03/29/2022: T oday the patient's foot was macerated, suggesting that the cast got wet. The patient has also been picking at his dry skin and has enlarged the wound on his left lateral leg. In the time between having his cast removed and my evaluation, he had picked more dry skin and opened up additional wounds on his Achilles area and dorsal foot. The plantar first metatarsal head wound, however, is smaller and clean with just macerated callus around the perimeter and light slough on the surface. The lateral leg wound measured a little bit larger but is  also fairly clean with eschar and minimal slough. 04/02/2022: The patient had vascular studies done last Friday and so his cast was not applied. He is here today to have that done. 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 04/05/2022: Both wounds are smaller and quite clean. There is just a little biofilm on the lateral leg wound. 10/20; the patient has a wound on the left lateral surgical incision at the level of his lateral knee this looks clean and improved. He is using silver alginate. He also has an area on his left medial foot for which she is using Hydrofera Blue under a total contact cast both wounds are measuring smaller 04/20/2022: The plantar foot wound has contracted considerably and is very close to closing. The lateral leg wound was measured a little larger, but there was a tiny open area that was included in the measurements that was not included last week. He has some eschar around the perimeter but otherwise the wound looks clean. 04/27/2022: The lateral leg wound looks better this week. He says that midweek, he felt it was very dry and began applying hydrogel to the site. I think this was beneficial. The foot wound is nearly closed underneath a thick layer of dry skin and callus. 05/04/2022: The foot wound is healed. He has developed a new small ulcer on his anterior tibial surface about midway up his leg. It has a little slough on the surface. The lateral leg wound still is fairly dry, but clean with just a  little biofilm on the surface. 05/11/2022: The wound on his foot reopened on Wednesday. A large blister formed which then broke open revealing the fat layer underneath. The ulcer on his anterior tibial surface is a little bit larger this week. The lateral leg wound has much better moisture balance this week. Fortunately, prior to his foot wound reopening, he did get the cast made for his orthotic. 05/15/2022: Already, the left medial foot wound has improved. The tissue  is less macerated and the surface is clean. The ulcer on his anterior tibial surface continues to enlarge. This seems likely secondary to accumulated moisture. The lateral leg wound continues to have an improved moisture balance with the use of collagen. 05/25/2022: The medial foot wound continues to contract. It is now substantially smaller with just a little slough on the surface. The anterior tibial surface wound continues to enlarge further. Once again, this seems to be secondary to moisture. The lateral leg wound does not seem to be changing much in size, but the moisture balance is better. 06/01/2022: The anterior tibial wound is closed. The medial foot wound is down to just a very small, couple of millimeters, opening. The lateral leg wound has good moisture balance, but remains unchanged in size. 12/15; the patient's anterior tibial wound has reopened, however the area on his right first metatarsal head is closed. The major wound is actually on the superior part of his surgical wound in the left lateral thigh. Not a completely viable surface under illumination. This may at some point require a debridement I think he is currently using Prisma. As noted the left medial foot wound has closed 06/14/2022: The anterior tibial wound has closed. The lateral leg wound has a better surface but is basically unchanged in size. The left medial foot wound has reopened. It looks as though there was some callus accumulation and moisture got under the callus which caused the tissue to break down again. [name], [name] ([id number]) [id number] Page 7 of 20 06/21/2022: A new wound has opened up just distal to the previous anterior tibial wound. It is small but has hypertrophic granulation tissue present. The lateral leg wound is a little bit narrower and has a layer of slough on the surface. The left medial foot wound is down to just a pinhole. His custom orthotics should be available  next week. 06/28/2022: The wound on his first metatarsal head has healed. He has developed a new small wound on his medial lower leg, in an old scar site. The lateral leg wound continues to contract but continues to accumulate slough, as well. 07/03/2022: Despite wearing his custom orthopedic shoes, he managed to reopen the wound on his first metatarsal head. He says he thinks his foot got wet and then some skin lifted up and he peeled this away. Both of the lower leg wounds are smaller and have some dry eschar on the surface. The lateral leg wound is quite a bit narrower today. 07/12/2022: The medial lower leg wound is closed. The anterior lower leg wound has contracted considerably. The lateral upper leg wound is narrower with a layer of slough on the surface. The first metatarsal head wound is also smaller, but had copious drainage which saturated the foam border dressing and resulted in some periwound tissue maceration. Fortunately there was no breakdown at this site. 07/19/2022: The lower leg shows signs of significant maceration; I think he must be sweating excessively inside his cast. There are several areas of skin breakdown present. The  wound on his foot is smaller and that on his lateral leg is narrower and is shorter by about a centimeter. 07/26/2022: Last week we used a zinc Coflex wrap prior to applying his total contact cast and this has had the effect of keeping his skin from getting macerated this week. The anterior leg wound has epithelialized substantially. The lateral leg wound is significantly smaller with just a bit of slough on the surface. The first metatarsal head wound is also smaller this week. 08/02/2022: The anterior leg wound was closed on arrival, but while he was sitting in the room, he picked it open again. The lateral leg wound is smaller with just a little slough on the surface and the first metatarsal head wound has contracted further, as well. 08/09/2022: The first metatarsal  head wound is covered with callus. Underneath the callus, it is nearly completely closed. The lateral leg wound is smaller again this week. The anterior leg wound looks better, but he has such heavy buildup of old skin, that moisture is getting underneath this, becoming trapped, and causing the underlying skin to get macerated and open up. 08/16/2022: The first metatarsal head wound is closed. The lateral leg wound continues to contract and is quite a bit smaller again this week. There is just a small, superficial opening remaining on his anterior tibial surface. 08/23/2022: The first metatarsal head wound has, by some miracle, remained closed. The lateral leg wound is substantially smaller with multiple areas of epithelialization. The anterior tibial surface wound is also quite a bit smaller and very clean. 08/30/2022: Unfortunately, his first metatarsal head wound opened up again. It happened in the same fashion as it has on prior occasions. Moisture got under dried skin/callus and created a wound when he removed his sock, taking the skin with it. The anterior tibial surface has a thick shell of hyperkeratotic skin. This has been contributing to ongoing repeat wounding events as moisture gets underneath this and causes tissue breakdown. Electronic Signature(s) Signed: 08/30/2022 12:47:57 PM By: Fredirick Maudlin MD FACS Entered By: Fredirick Maudlin on 08/30/2022 12:47:56 -------------------------------------------------------------------------------- Paring/cutting of benign hyperkeratotic lesion 2-4 Details Patient Name: Date of Service: GLENN, POLKINGHORN 08/30/2022 10:45 A M Medical Record Number: [id number] Patient Account Number: [id number] Date of Birth/Sex: Treating RN: May 22, 1951 (72 y.o. [name] Primary Care Provider: [name] Other Clinician: Referring Provider: Treating Provider/Extender: [name] in Treatment: 72 Procedure Performed for: Wound #30  Left,Anterior Lower Leg Performed By: Physician Fredirick Maudlin, MD Post Procedure Diagnosis Same as Pre-procedure Notes Scribed for Dr. Celine Ahr by Blanche East, RN Electronic Signature(s) Signed: 08/30/2022 1:02:33 PM By: Fredirick Maudlin MD FACS Signed: 08/30/2022 5:01:03 PM By: Blanche East RN Entered By: Blanche East on 08/30/2022 12:04:53 -------------------------------------------------------------------------------- Physical Exam Details Patient Name: Date of Service: [name]. 08/30/2022 10:45 A M Medical Record Number: [id number] Patient Account Number: [id number] Date of Birth/Sex: Treating RN: 02-12-1951 (72 y.o. M) Primary Care Provider: [name] Other Clinician: Referring Provider: Treating Provider/Extender: [name] in Treatment: [address] [place] ([id number]) [id number] Page 8 of 20 Constitutional Hypertensive, asymptomatic. . . . no acute distress. Respiratory Normal work of breathing on room air. Notes 08/30/2022: Unfortunately, his first metatarsal head wound opened up again. The lateral leg wound is smaller again with just a layer of slough on the surface. The anterior tibial surface has a thick shell of hyperkeratotic skin. This has been contributing to ongoing repeat wounding events as moisture  gets underneath this and causes tissue breakdown. Electronic Signature(s) Signed: 08/30/2022 12:49:16 PM By: Fredirick Maudlin MD FACS Entered By: Fredirick Maudlin on 08/30/2022 12:49:16 -------------------------------------------------------------------------------- Physician Orders Details Patient Name: Date of Service: [name]. 08/30/2022 10:45 A M Medical Record Number: [id number] Patient Account Number: [id number] Date of Birth/Sex: Treating RN: [birth year] (72 y.o. [name] Primary Care Provider: [name] Other Clinician: Referring Provider: Treating Provider/Extender: [name] in Treatment: 60 Verbal / Phone Orders: No Diagnosis Coding ICD-10 Coding Code Description I87.322 Chronic venous hypertension (idiopathic) with inflammation of left lower extremity L97.828 Non-pressure chronic ulcer of other part of left lower leg with other specified severity L97.528 Non-pressure chronic ulcer of other part of left foot with other specified severity E11.51 Type 2 diabetes mellitus with diabetic peripheral angiopathy without gangrene I89.0 Lymphedema, not elsewhere classified L85.9 Epidermal thickening, unspecified Follow-up Appointments ppointment in 1 week. - Dr Celine Ahr - Room 2 Return A Anesthetic Wound #22 Left,Proximal,Lateral Lower Leg (In clinic) Topical Lidocaine 4% applied to wound bed Bathing/ Shower/ Hygiene May shower with protection but do not get wound dressing(s) wet. Protect dressing(s) with water repellant cover (for example, large plastic bag) or a cast cover and may then take shower. Edema Control - Lymphedema / SCD / Other Avoid standing for long periods of time. Patient to wear own compression stockings every day. - on right leg; Moisturize legs daily. Compression stocking or Garment 20-30 mm/Hg pressure to: - left leg daily Off-Loading Wound #18R Left,Plantar Metatarsal head first Open toe surgical shoe to: - Front off loader Left ft Other: - minimal weight bearing left foot Wound Treatment Wound #18R - Metatarsal head first Wound Laterality: Plantar, Left Cleanser: Soap and Water 1 x Per Week/30 Days Discharge Instructions: May shower and wash wound with dial antibacterial soap and water prior to dressing change. Cleanser: Wound Cleanser 1 x Per Week/30 Days Discharge Instructions: Cleanse the wound with wound cleanser prior to applying a clean dressing using gauze sponges, not tissue or cotton balls. Prim Dressing: Sorbalgon AG Dressing 2x2 (in/in) 1 x Per Week/30 Days ary Discharge Instructions: Apply to  wound bed as instructed [name], [name] ([id number]) [phone] Page 9 of 20 Secondary Dressing: ALLEVYN Heel 4 1/2in x 5 1/2in / 10.5cm x 13.5cm 1 x Per Week/30 Days Discharge Instructions: Apply over primary dressing as directed. Secondary Dressing: Optifoam Non-Adhesive Dressing, 4x4 in 1 x Per Week/30 Days Discharge Instructions: Apply over primary dressing as directed. Secondary Dressing: Zetuvit Plus Silicone Border Dressing 4x4 (in/in) 1 x Per Week/30 Days Discharge Instructions: Apply silicone border over primary dressing as directed. Wound #22 - Lower Leg Wound Laterality: Left, Lateral, Proximal Cleanser: Soap and Water 3 x Per Week/30 Days Discharge Instructions: May shower and wash wound with dial antibacterial soap and water prior to dressing change. Cleanser: Wound Cleanser 3 x Per Week/30 Days Discharge Instructions: Cleanse the wound with wound cleanser prior to applying a clean dressing using gauze sponges, not tissue or cotton balls. Peri-Wound Care: Sween Lotion (Moisturizing lotion) 3 x Per Week/30 Days Discharge Instructions: Apply moisturizing lotion to the leg Topical: Skintegrity Hydrogel 4 (oz) 3 x Per Week/30 Days Discharge Instructions: Apply hydrogel as directed Prim Dressing: Endoform 2x2 in 3 x Per Week/30 Days ary Discharge Instructions: Moisten with Hydrogel or saline Secondary Dressing: ABD Pad, 8x10 (Generic) 3 x Per Week/30 Days Discharge Instructions: Apply over primary dressing as directed. Secured With: Elastic Bandage 4 inch (ACE bandage) 3 x Per  Week/30 Days Discharge Instructions: Secure with ACE bandage as directed. Wound #30 - Lower Leg Wound Laterality: Left, Anterior Cleanser: Soap and Water 1 x Per Week/30 Days Discharge Instructions: May shower and wash wound with dial antibacterial soap and water prior to dressing change. Cleanser: Wound Cleanser 1 x Per Week/30 Days Discharge Instructions: Cleanse the wound with  wound cleanser prior to applying a clean dressing using gauze sponges, not tissue or cotton balls. Prim Dressing: Sorbalgon AG Dressing 2x2 (in/in) 1 x Per Week/30 Days ary Discharge Instructions: Apply to wound bed as instructed Secondary Dressing: Zetuvit Plus Silicone Border Dressing 4x4 (in/in) 1 x Per Week/30 Days Discharge Instructions: Apply silicone border over primary dressing as directed. Compression Wrap: CoFlex TLC Zinc 1 x Per Week/30 Days Patient Medications llergies: No Known Drug Allergies A Notifications Medication Indication Start End 08/30/2022 amoxicillin-pot clavulanate DOSE oral 875 mg-125 mg tablet - 1 tab p.o. twice daily x 10 days Electronic Signature(s) Signed: 08/30/2022 1:02:33 PM By: Fredirick Maudlin MD FACS Previous Signature: 08/30/2022 12:54:47 PM Version By: Fredirick Maudlin MD FACS Entered By: Fredirick Maudlin on 08/30/2022 12:58:03 -------------------------------------------------------------------------------- Problem List Details Patient Name: Date of Service: [name]. 08/30/2022 10:45 A M Medical Record Number: [id number] Patient Account Number: [id number] Date of Birth/Sex: Treating RN: Nov 20, 1950 (72 y.o. M) Primary Care Provider: [name] Other Clinician: Referring Provider: Treating Provider/Extender: [name] in Treatment: [address], [place] ([id number]) [id number] Page 10 of 20 Active Problems ICD-10 Encounter Code Description Active Date MDM Diagnosis I87.322 Chronic venous hypertension (idiopathic) with inflammation of left lower 04/12/2021 No Yes extremity L97.828 Non-pressure chronic ulcer of other part of left lower leg with other specified 04/12/2021 No Yes severity L97.528 Non-pressure chronic ulcer of other part of left foot with other specified 08/26/2022 No Yes severity E11.51 Type 2 diabetes mellitus with diabetic peripheral angiopathy without gangrene 04/12/2021 No  Yes I89.0 Lymphedema, not elsewhere classified 04/12/2021 No Yes L85.9 Epidermal thickening, unspecified 08/30/2022 No Yes Inactive Problems ICD-10 Code Description Active Date Inactive Date E11.621 Type 2 diabetes mellitus with foot ulcer 04/12/2021 04/12/2021 L97.828 Non-pressure chronic ulcer of other part of left lower leg with other specified severity 06/08/2022 06/08/2022 E11.42 Type 2 diabetes mellitus with diabetic polyneuropathy 04/12/2021 04/12/2021 L02.416 Cutaneous abscess of left lower limb 06/13/2021 06/13/2021 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 07/20/2021 07/20/2021 Resolved Problems Electronic Signature(s) Signed: 08/30/2022 12:57:44 PM By: Fredirick Maudlin MD FACS Previous Signature: 08/30/2022 12:41:50 PM Version By: Fredirick Maudlin MD FACS Entered By: Fredirick Maudlin on 08/30/2022 12:57:44 -------------------------------------------------------------------------------- Progress Note Details Patient Name: Date of Service: [name]. 08/30/2022 10:45 A M Medical Record Number: [id number] Patient Account Number: [id number] Date of Birth/Sex: Treating RN: 1950-12-12 (72 y.o. M) Primary Care Provider: [name] Other Clinician: Referring Provider: Treating Provider/Extender: [name] in Treatment: [address] [place] ([id number]) [id number] Page 11 of 20 Chief Complaint Information obtained from Patient Left leg and foot ulcers 04/12/2021; patient is here for wounds on his left lower leg and left plantar foot over the first metatarsal head History of Present Illness (HPI) 10/11/17; Mr. Fenger is a 72 year old man who tells me that in 2015 he slipped down the latter traumatizing his left leg. He developed a wound in the same spot the area that we are currently looking at. He states this closed over for the most part although he always felt it was somewhat unstable.  In 2016 he hit  the same area with the door of his car had this reopened. He tells me that this is never really closed although sometimes an inflow it remains open on a constant basis. He has not been using any specific dressing to this except for topical antibiotics the nature of which were not really sure. His primary doctor did send him to see Dr. [name] of interventional cardiology. He underwent an angiogram on 08/06/17 and he underwent a PTA and directional atherectomy of the lesser distal SFA and popliteal arteries which resulted in brisk improvement in blood flow. It was noted that he had 2 vessel runoff through the anterior tibial and peroneal. He is also been to see vascular and interventional radiologist. He was not felt to have any significant superficial venous insufficiency. Presumably is not a candidate for any ablation. It was suggested he come here for wound care. The patient is a type II diabetic on insulin. He also has a history of venous insufficiency. ABIs on the left were noncompressible in our clinic 10/21/17; patient we admitted to the clinic last week. 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 We put Iodosorb on him after an aggressive debridement and 3 layer compression. He complained of pain in his ankle and itching with is skin in fact he scratched the area on the medial calf superiorly at the rim of our wraps and he has 2 small open areas in that location today which are new. I changed his primary dressing today to silver collagen. As noted he is already had revascularization and does not have any significant superficial venous insufficiency that would be amenable to ablation 10/28/17; patient admitted to the clinic 2 weeks ago. He has a smaller Wound. Scratch injury from last week revealed. There is large wound over the tibial area. This is smaller. Granulation looks healthy. No need for debridement. 11/04/17;  the wound on the left lateral calf looks better. Improved dimensions. Surface of this looks better. We've been maintaining him and Kerlix Coban wraps. He finds this much more comfortable. Silver collagen dressing 11/11/17; left lateral Wound continues to look healthy be making progress. Using a #5 curet I removed removed nonviable skin from the surface of the wound and then necrotic debris from the wound surface. Surface of the wound continues to look healthy. ooHe also has an open area on the left great toenail bed. We've been using topical antibiotics. 11/19/17; left anterior lateral wound continues to look healthy but it's not closed. ooHe also had a small wound above this on the left leg ooInitially traumatic wounds in the setting of significant chronic venous insufficiency and stasis dermatitis 11/25/17; left anterior wounds superiorly is closed still a small wound inferiorly. 12/02/17; left anterior tibial area. Arrives today with adherent callus. Post debridement clearly not completely closed. Hydrofera Blue under 3 layer compression. 12/09/17; left anterior tibia. Circumferential eschar however the wound bed looks stable to improved. We've been using Hydrofera Blue under 3 layer compression 12/17/17; left anterior tibia. Apparently this was felt to be closed however when the wrap was taken off there is a skin tear to reopen wounds in the same area we've been using Hydrofera Blue under 3 layer compression 12/23/17 left anterior tibia. Not close to close this week apparently the [facility] was stuck to this again. Still circumferential eschar requiring debridement. I put a contact layer on this this time under the Hydrofera Blue 12/31/17; left anterior tibia. Wound  is better slight amount of hyper-granulation. Using Hydrofera Blue over Adaptic. 01/07/18; left anterior tibia. The wound had some surface eschar however after this was removed he has no open wound.he was already revascularized by  Dr. [name] when he came to our clinic with atherectomy of the left SFA and popliteal artery. He was also sent to interventional radiology for venous reflux studies. He was not felt to have significant reflux but certainly has chronic venous changes of his skin with hemosiderin deposition around this area. He will definitely need to lubricate his skin and wear compression stocking and I've talked to him about this. READMISSION 05/26/2018 This is a now 72 year old man we cared for with traumatic wounds on his left anterior lower extremity. He had been previously revascularized during that admission by Dr. [name]. Apparently in follow-up Dr. [name] noted that he had deterioration in his arterial status. He underwent a stent placement in the distal left SFA on 04/22/2018. 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 He went back to the angiography suite on 04/30/2018 he underwent PTA and balloon angioplasty of the occluded left mid anterior tibial artery, thrombotic occlusion went from 100 to 0% which reconstitutes the posterior tibial artery. He had thrombectomy and aspiration of the peroneal artery. The stent placed in the distal SFA left SFA was still occluded. 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 He has been rec[facility] after the most recent hospitalization. He is going home tomorrow. He tells me that 2 weeks ago he traumatized the tip of his left fifth toe. He came in urgently for our review of this. This was a history of before I noted that Dr. [name] had already noted dry gangrenous changes of the left fifth toe 06/09/2018; 2-week follow-up. I did contact Dr. [name] after his last appointment and he  apparently saw 1 of Dr. Irven Shelling colleagues the next day. He does not follow-up with Dr. [name] himself until Thursday of this week. 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 Nevertheless there is no evidence of infection no drainage and no pain. He had a new area that this week when we were signing him in today on the left anterior mid tibia area, this is in close proximity to the previous wound we have dealt with in this clinic. 06/23/2018; 2-week follow-up. I did not receive a recent note from Dr. [name] to review today. Our office is trying to obtain this. He is apparently not planning to do further vascular interventions and wondered about compression to try and help with the patient's chronic venous insufficiency. However we are also concerned about the arterial flow. ooHe arrives in clinic today with a new area on the left third toe. The areas on the calf/anterior tibia are close to closing. The left fifth toe is still mummified using Betadine. -In reviewing things with the patient he has what sounds like claudication with mild to moderate amount of activity. 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 I prescribed Levaquin over the phone while we attempted to arrange a plan of care. However the patient called yesterday to report he had low-grade fever and he came in today  acutely. There is been a marked deterioration in the left third toe with spreading cellulitis up into the dorsal left foot. He was referred to the emergency room. Readmission: 06/29/2020 patient presents today for reevaluation here in our clinic he was previously treated by Dr. Dellia Nims at the latter part of 2019 in 2 the beginning of 2020. Subsequently we have not seen him since that time in the interim he did have evaluation with vein and vascular specialist specifically Dr. Anice Paganini who did perform quite extensive work for a left femoral to anterior tibial artery bypass.  With that being said in the interim the patient has developed significant lymphedema and has wounds that he tells me have really never healed in regard to the incision site on the left leg. He also has multiple wounds on the feet for various reasons some of which is that he tends to pick at his feet. Fortunately there is no signs of active infection systemically at this time he does have some wounds that are little bit deeper but most are fairly superficial he seems to have good blood flow and overall everything appears to be healthy I see no bone exposed and no obvious signs of osteomyelitis. I do not know that he necessarily needs a x-ray at this point although that something we could consider depending on how things progress. The patient does have a history of lymphedema, diabetes, this is type II, chronic kidney disease stage III, hypertension, and history of peripheral vascular disease. 07/05/2020; patient admitted last week. Is a patient I remember from 2019 he had a spreading infection involving the left foot and we sent him to the hospital. 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 He subsequently had a left femoral to anterior tibial bypass by Dr.Cain vein and vascular. He also has severe lymphedema with chronic skin changes related to that on the left leg. [name], [name] ([id number]) [id number] Page 12 of 20 The most problematic area that was new today was on the left medial great toe. This was apparently a small area last week there was purulent drainage which our intake nurse cultured. Also areas on the left medial foot and heel left lateral foot. He has 2 areas on the left medial calf left lateral calf in the setting of the severe lymphedema. 07/13/2020 on evaluation today patient appears to be doing better in my opinion compared to his last visit. The good news is there is no signs of active infection systemically and  locally I do not see any signs of infection either. He did have an x-ray which was negative that is great news he had a culture which showed MRSA but at the same time he is been on the doxycycline which has helped. I do think we may want to extend this for 7 additional days 1/25; patient admitted to the clinic a few weeks ago. 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 We have been putting him under compression his edema control is a lot better but he is severe verricused skin on the left leg. He is really done quite well he still has an open area on the left medial calf and the left medial first metatarsal head. We have been using silver collagen on the leg silver alginate on the foot 07/27/2020 upon evaluation today patient appears to be doing decently well in regard to his wounds. He still has a lot of  dry skin on the left leg. Some of this is starting to peel back and I think he may be able to have them out by removing some that today. Fortunately there is no signs of active infection at this time on the left leg although on the right leg he does appear to have swelling and erythema as well as some mild warmth to touch. This does have been concerned about the possibility of cellulitis although within the differential diagnosis I do think that potentially a DVT has to be at least considered. We need to rule that out before proceeding would just call in the cellulitis. Especially since he is having pain in the posterior aspect of his calf muscle. 2/8; the patient had seen sparingly. He has severe skin changes of chronic lymphedema in the left leg thickened hyperkeratotic verrucous skin. He has an open wound on the medial part of the left first met head left mid tibia. He also has a rim of nonepithelialized skin in the anterior mid tibia. He brought in the AmLactin lotion that was been prescribed although I am not sure under compression and its utility. There  concern about cellulitis on the right lower leg the last time he was here. He was put on on antibiotics. His DVT rule out was negative. The right leg looks fine he is using his stocking on this area 08/10/2020 upon evaluation today patient appears to be doing well with regard to his leg currently. He has been tolerating the dressing changes without complication. Fortunately there is no signs of active infection which is great news. Overall very pleased with where things stand. 2/22; the patient still has an area on the medial part of the left first met his head. This looks better than when I last saw this earlier this month he has a rim of epithelialization but still some surface debris. Mostly everything on the left leg is healed. There is still a vulnerable in the left mid tibia area. 08/30/2020 upon evaluation today patient appears to be doing much better in regard to his wounds on his foot. 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 Nonetheless I think we will address that today I Minna send in a prescription for him in that regard. Overall though there does not appear to be any signs of significant worsening. 09/07/2020 on evaluation today patient's wounds over his left foot appear to be doing excellent. I do not see any signs of infection there is some callus buildup this can require debridement for certain but overall I feel like he is managing quite nicely. He still using the AmLactin cream which has been beneficial for him as well. 3/22; left foot wound is closed. There is no open area here. He is using ammonium lactate lotion to the lower extremities to help exfoliate dry cracked skin. He has compression stockings from [facility] in [place]. The wound on the medial part of his left first met head is healed today. READMISSION 04/12/2021 Mr. Duva is a  patient we know fairly well he had a prolonged stay in clinic in 2019 with wounds on his left lateral and left anterior lower extremity in the setting of chronic venous insufficiency. More recently he was here earlier this year with predominantly an area on his left foot first metatarsal head plantar and he says the plantar foot broke down on  its not long after we discharged him but he did not come back here. The last few months areas of broken down on his left anterior and again the left lateral lower extremity. The leg itself is very swollen chronically enlarged a lot of hyperkeratotic dry Berry Q skin in the left lower leg. His edema extends well into the thigh. He was seen by Dr. Donzetta Matters. He had ABIs on 03/02/2021 showing an ABI on the right of 1 with a TBI of 0.72 his ABI in the left at 1.09 TBI of 0.99. Monophasic and biphasic waveforms on the right. On the left monophasic waveforms were noted he went on to have an angiogram on 03/27/2021 this showed the aortic aortic and iliac segments were free of flow-limiting stenosis the left common femoral vein to evaluate the left femoral to anterior tibial artery bypass was unobstructed the bypass was patent without any areas of stenosis. We discharged the patient in bilateral juxta lite stockings but very clearly that was not sufficient to control the swelling and maintain skin integrity. He is clearly going to need compression pumps. 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 This is fortunate because he is on his feet for long periods of time. 10/27; patient comes in with our intake nurse reporting copious amount of green drainage from the left anterior mid tibia the left dorsal foot and to a lesser extent the left medial mid tibia. We left the compression wrap on all week for the amount of edema in his left leg is quite a bit better. We use silver alginate as the primary dressing 11/3; edema control is  good. Left anterior lower leg left medial lower leg and the plantar first metatarsal head. The left anterior lower leg required debridement. Deep tissue culture I did of this wound showed MRSA I put him on 10 days of doxycycline which she will start today. We have him in compression wraps. He has a security card and [employer] however he is retiring on November 15. We will need to then get him into a better offloading boot for the left foot perhaps a total contact cast 11/10; edema control is quite good. Left anterior and left medial lower leg wounds in the setting of chronic venous insufficiency and lymphedema. He also has a substantial area over the left plantar first metatarsal head. 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 He has significant hypergranulation on the left plantar foot wound. The patient is a diabetic but he does not have significant PAD 11/17; edema control is quite good. Left anterior and left medial lower leg wounds look better. The really concerning area remains the area on the left plantar first metatarsal head. He has a rim of epithelialization. 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 Starting today with a forefoot off loader but . possibly a total contact cast. 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 Arrives with a very significant odor from I think all of the wounds on the left leg and the left foot. Because of this I did not put  a total contact cast on him today but will could still consider this. His wife was having cataract surgery which is the reason he missed the appointment 12/6. I saw this man 5 days ago with a swelling below the popliteal fossa. I thought  he actually might have a Baker's cyst however the DVT rule out study that we could arrange right away was negative the technician told me this was not a ruptured Baker's cyst. We attempted to get this aspirated by under ultrasound guidance in interventional radiology however all they did was an ultrasound however it shows an extensive fluid collection 62 x 8 x 9.4 in the left thigh and left calf. The patient states he thinks this started 8 days ago or so but he really is not complaining of any pain, fever or systemic symptoms. He has not ha 12/20; after some difficulty I managed to get the patient into see Dr. Donzetta Matters. Eventually he was taken into the hospital and had a drain put in the fluid collection below his left knee posteriorly extending into the posterior thigh. He still has the drain in place. Culture of this showed moderate staff aureus few Morganella and few Klebsiella he is now on doxycycline and ciprofloxacin as suggested by infectious disease he is on this for a month. The drain will remain in place until it stops draining 12/29; he comes in today with the 1 wound on his left leg and the area on the left plantar first met head significantly smaller. Both look healthy. He still has the drain in the left leg. He says he has to change this daily. Follows up with Dr. Donzetta Matters on January 11. [name], [name] ([id number]) [id number] Page 13 of 20 06/29/2021; the wounds that I am following on the left leg and left first met head continued to be quite healthy. However the area where his inferior drain is in place had copious amounts of drainage which was green in color. The wound here is larger. Follows up with Dr. Gwenlyn Saran of vein and vascular his surgeon next week as well as infectious disease. He remains on ciprofloxacin and doxycycline. He is not complaining of excessive pain in either one of the drain areas 1/12; the patient saw vascular surgery and infectious disease. Vascular  surgery has left the drain in place as there was still some notable drainage still see him back in 2 weeks. Dr. Velna Ochs stop the doxycycline and ciprofloxacin and I do not believe he follows up with them at this point. Culture I did last week showed both doxycycline resistant MRSA and Pseudomonas not sensitive to ciprofloxacin although only in rare titers 1/19; the patient's wound on the left anterior lower leg is just about healed. We have continued healing of the area that was medially on the left leg. Left first plantar metatarsal head continues to get smaller. The major problem here is his 2 drain sites 1 on the left upper calf and lateral thigh. There is purulent drainage still from the left lateral thigh. I gave him antibiotics last week but we still have recultured. He has the drain in the area I think this is eventually going to have to come out. I suspect there will be a connecting wound to heal here perhaps with improved VAc 1/26; the patient had his drain removed by vein and vascular on 1/25/. This was a large pocket of fluid in his left thigh that seem to tunnel into his left upper calf. 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 His mention his Penrose drain was removed today. He now has a tunneling wound on his left calf and left thigh. Both of these probe widely towards each other although I cannot really prove that they connect. Both wounds on his lower leg anteriorly are closed and his area over the first metatarsal head on his right foot continues to improve. We are using Hydrofera Blue here. He also saw infectious disease culture of the abscess they noted was polymicrobial with MRSA, Morganella and Klebsiella he was treated with doxycycline and ciprofloxacin for 4 weeks ending on 07/03/2021. They did not recommend any further antibiotics. Notable that while he still had the Penrose drain in place last week he had purulent drainage coming out of the inferior IandD site  this grew [facility], MRSA and Pseudomonas but there does not appear to be any active infection in this area today with the drain out and he is not systemically unwell 2/2; with regards to the drain sites the superior one on the thigh actually is closed down the one on the upper left lateral calf measures about 8 and half centimeters which is an improvement seems to be less prominent although still with a lot of drainage. The only remaining wound is over the first metatarsal head on the left foot and this looks to be continuing to improve with Hydrofera Blue. 2/9; the area on his plantar left foot continues to contract. Callus around the wound edge. The drain sites specifically have not come down in depth. We put the wound VAC on Monday he changed the canister late last night our intake nurse reported a pocket of fluid perhaps caused by our compression wraps 2/16; continued improvement in left foot plantar wound. drainage site in the calf is not improved in terms of depth (wound vac) 2/23; continued improvement in the left foot wound over the first metatarsal head. With regards to the drain sites the area on his thigh laterally is healed however the open area on his calf is small in terms of circumference by still probes in by about 15 cm. Within using the wound VAC. Hydrofera Blue on his foot 08/24/2021: The left first metatarsal head wound continues to improve. The wound bed is healthy with just some surrounding callus. Unfortunately the open drain site on his calf remains open and tunnels at least 15 cm (the extent of a Q-tip). This is despite several weeks of wound VAC treatment. Based on reading back through the notes, there has been really no significant change in the depth of the wound, although the orifice is smaller and the more cranial wound on his thigh has closed. I suspect the tunnel tracks nearly all the way to this location. 08/31/2021: Continued improvement in the left first  metatarsal head wound. There has been absolutely no improvement to the long tunnel from his open drain site on his calf. We have tried to get him into see vascular surgery sooner to consider the possibility of simply filleting the tract open and allowing it to heal from the bottom up, likely with a wound VAC. They have not yet scheduled a sooner appointment than his current mid April 09/14/2021: He was seen by vascular surgery and they took him to the operating room last week. They opened a portion of the tunnel, but did not extend the entire length of the known open subcutaneous tract. I read Dr. Claretha Cooper operative note and it is not clear from that documentation why only a  portion of the tract was opened. The heaped up granulation tissue was curetted and removed from at least some portion of the tract. They did place a wound VAC and applied an Unna boot to the leg. The ulcer on his left first metatarsal head is smaller today. The bed looks good and there is just a small amount of surrounding callus. 09/21/2021: The ulcer on his left first metatarsal head looks to be stalled. There is some callus surrounding the wound but the wound bed itself does not appear particularly dynamic. The tunnel tract on his lateral left leg seems to be roughly the same length or perhaps slightly smaller but the wound bed appears healthy with good granulation tissue. He opened up a new wound on his medial thigh and the site of a prior surgical incision. He says that he did this unconsciously in his sleep by scratching. 09/28/2021: Unfortunately, the ulcer on his left first metatarsal head has extended underneath the callus toward the dorsum of his foot. The medial thigh wounds are roughly the same. The tunnel on his lateral left leg continues to be problematic; it is longer than we are able to actually probe with a Q-tip. I am still not certain as to why Dr. Donzetta Matters did not open this up entirely when he took the patient to the  operating room. We will likely be back in the same situation with just a small superficial opening in a long unhealed tract, as the open portion is granulating in nicely. 10/02/2021: The patient was initially scheduled for a nurse visit, but we are also applying a total contact cast today. The plantar foot wound looks clean without significant accumulated callus. We have been applying Prisma silver collagen to the site. 10/05/2021: The patient is here for his first total contact cast change. We have tried using gauze packing strips in the tunnel on his lateral leg wound, but this does not seem to be working any better than the white VAC foam. The foot ulcer looks about the same with minimal periwound callus. Medial thigh wound is clean with just some overlying eschar. 10/12/2021: The plantar foot wound is stable without any significant accumulation of periwound callus. The surface is viable with good granulation tissue. The medial thigh wounds are much smaller and are epithelializing. On the other hand, he had purulent drainage coming from the tunnel on his lateral leg. He does go back to see Dr. Donzetta Matters next week and is planning to ask him why the wound tunnel was not completely opened at the time of his most recent operation. 10/19/2021: The plantar foot wound is markedly improved and has epithelial tissue coming through the surface. The medial thigh wounds are nearly closed with just a tiny open area. He did see Dr. Donzetta Matters earlier this week and apparently they did discuss the possibility of opening the sinus tract further and enabling a wound VAC application. Apparently there are some limits as to what Dr. 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 I think if we could get the tract open to the level of the popliteal fossa, this would greatly aid in her ability to get this chart closed. That being said, however, today when I probed the tract with a Q-tip, I was not able to  insert the entirety of the Q-tip as I have on previous occasions. The tunnel is shorter by about 4 cm. The surface is clean with good granulation tissue and no further episodes of purulent drainage. 10/30/2021:  Last week, the patient underwent surgery and had the long tract in his leg opened. There was a rind that was debrided, according to the operative report. His medial thigh ulcers are closed. The plantar foot wound is clean with a good surface and some built up surrounding callus. 11/06/2021: The overall dimensions of the large wound on his lateral leg remain about the same, but there is good granulation tissue present and the tunneling is a little bit shorter. He has a new wound on his anterior tibial surface, in the same location where he had a similar lesion in the past. The plantar foot wound is clean with some buildup surrounding callus. Just toward the medial aspect of his foot, however, there is an area of darkening that once debrided, revealed another opening in the skin surface. 11/13/2021: The anterior tibial surface wound is closed. The plantar foot wound has some surrounding callus buildup. The area of darkening that I debrided last week and revealed an opening in the skin surface has closed again. The tunnel in the large wound on his lateral leg has come in by about 3 cm. There is healthy granulation tissue on the entire wound surface. JACQUAN, VIARS ([id number]) [id number] Page 14 of 20 11/23/2021: The patient was out of town last week and did wet-to-dry dressings on his large wound. 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 Unfortunately, he picked open the wound on his left medial thigh. He says that it was itching and he just could not stop scratching it until it was open again. The wound on his plantar foot is smaller and has not accumulated a tremendous amount of callus. The lateral  leg wound is shallower and the tunnel has also decreased in depth. There is just a little bit of slough accumulation on the surface. 11/30/2021: Another portion of his left medial thigh has been opened up. All of these wounds are fairly superficial with just a little bit of slough and eschar accumulation. The wound on his plantar foot is almost closed with just a bit of eschar and periwound callus accumulation. The lateral leg wound is nearly flush with the surrounding skin and the tunnel is markedly shallower. 12/07/2021: There is just 1 open area on his left medial thigh. It is clean with just a little bit of perimeter eschar. The wound on his plantar foot continues to contract and just has some eschar and periwound callus accumulation. The lateral leg wound is closing at the more distal aspect and the tunnel is smaller. The surface is nearly flush with the surrounding skin and it has a good bed of granulation tissue. 12/14/2021: The thigh and foot wounds are closed. The lateral leg wound has closed over approximately half of its length. The tunnel continues to contract and the surface is now flush with the surrounding skin. The wound bed has robust granulation tissue. 12/22/2021: The thigh and foot wounds have reopened. The foot wound has a lot of callus accumulation around and over it. The thigh wound is tiny with just a little bit of slough in the wound bed. The lateral leg wound continues to contract. His vascular surgeon took the wound VAC off earlier in the week and the patient has been doing wet-to-dry dressings. There is a little slough accumulation on the surface. The tunnel is about 3 cm in depth at this point. 12/28/2021: The thigh wound is closed again. The foot wound has some  callus that subsequently has peeled back exposing just a small slit of a wound. The lateral leg wound Is down to about half the size that it originally was and the tunnel is down to about half a centimeter in  depth. 01/04/2022: The thigh wound remains closed. The foot wound has heavy callus overlying the wound site. Once this was debrided, the wound was found to be closed. The lateral leg wound is smaller again this week and very superficial. No tunnel could be identified. 01/12/2022: The thigh and foot wounds both remain closed. The lateral leg wound is now nearly flush with the skin surface. There is good granulation tissue present with a light layer of slough. 01/19/2022: Due to the way his wrap was placed, the patient did not change the dressing on his thigh at all and so the foam was saturated and his skin is macerated. There is a light layer of slough on the wound surface. The underlying granulation tissue is robust and healthy-appearing. He has heavy callus buildup at the site of his first metatarsal head wound which is still healed. 02/01/2022: He has been in silver alginate. When he removed the dressing from his thigh wound, however, some leg, superficially reopening a portion of the wound that had healed. In addition, underneath the callus at his left first metatarsal head, there appears to be a blister and the wound appears to be open again. 02/08/2022: The lateral leg wound has contracted substantially. There is eschar and a light layer of slough present. He says that it is starting to pull and is uncomfortable. On inspection, there is some puckering of the scar and the eschar is quite dry; this may account for his symptoms. On his first metatarsal head, the wound is much smaller with just some eschar on the surface. The callus has not reaccumulated. He reports that he had a blister come up on his medial thigh wound at the distal aspect. It popped and there is now an opening in his skin again. Looking back through his [place] of wound photos, there is what looks like a permanent suture just deep to this location and it may be trying to erode through. We have been using silver alginate on his  wounds. 02/15/2022: The lateral leg wound is about half the size it was last week. It is clean with just a little perimeter eschar and light slough. The wound on his first metatarsal head is about the same with heavy callus overlying it. The medial thigh wound is closed again. He does have some skin changes on the top of his foot that looks potentially yeast related. 02/22/2022: The skin on the top of his foot improved with the use of a topical antifungal. The lateral leg wound continues to contract and is again smaller this week. There is a little bit of slough and eschar on the surface. The first metatarsal head wound is a little bit smaller but has reaccumulated a thick callus over the top. He decided to try to trim his toenail and ultimately took the entire nail off of his left great toe. 03/02/2022: His lateral leg wound continues to improve, as does the wound on his left great toe. Unfortunately, it appears that somehow his foot got wet and moisture seeped in through the opening causing his skin to lift. There is a large wound now overlying his first metatarsal on both the plantar, medial, and dorsal portion of his foot. There is necrotic tissue and slough present underneath the shaggy macerated  skin. 03/08/2022: The lateral leg wound is smaller again today. There is just a light layer of slough and eschar on the surface. The great toe wound is smaller again today. The first metatarsal wound is a little bit smaller today and does not look nearly as necrotic and macerated. There is still slough and nonviable tissue present. 03/15/2022: The lateral leg wound is narrower and just has a little bit of light slough buildup. The first metatarsal wound still has a fair amount of moisture affecting the periwound skin. The great toe wound is healed. 03/22/2022: The lateral leg wound is now isolated to just at the level of his knee. There is some eschar and slough accumulation. The first metatarsal head wound  has epithelialized tremendously and is about half the size that it was last week. He still has some maceration on the top of his foot and a fungal odor is present. 03/29/2022: T oday the patient's foot was macerated, suggesting that the cast got wet. The patient has also been picking at his dry skin and has enlarged the wound on his left lateral leg. In the time between having his cast removed and my evaluation, he had picked more dry skin and opened up additional wounds on his Achilles area and dorsal foot. The plantar first metatarsal head wound, however, is smaller and clean with just macerated callus around the perimeter and light slough on the surface. The lateral leg wound measured a little bit larger but is also fairly clean with eschar and minimal slough. 04/02/2022: The patient had vascular studies done last Friday and so his cast was not applied. He is here today to have that done. 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 04/05/2022: Both wounds are smaller and quite clean. There is just a little biofilm on the lateral leg wound. 10/20; the patient has a wound on the left lateral surgical incision at the level of his lateral knee this looks clean and improved. He is using silver alginate. He also has an area on his left medial foot for which she is using Hydrofera Blue under a total contact cast both wounds are measuring smaller 04/20/2022: The plantar foot wound has contracted considerably and is very close to closing. The lateral leg wound was measured a little larger, but there was a tiny open area that was included in the measurements that was not included last week. He has some eschar around the perimeter but otherwise the wound looks clean. 04/27/2022: The lateral leg wound looks better this week. He says that midweek, he felt it was very dry and began applying hydrogel to the site. I think this was beneficial. The foot wound is nearly closed underneath a thick layer of dry  skin and callus. 05/04/2022: The foot wound is healed. He has developed a new small ulcer on his anterior tibial surface about midway up his leg. It has a little slough on the surface. The lateral leg wound still is fairly dry, but clean with just a little biofilm on the surface. 05/11/2022: The wound on his foot reopened on Wednesday. A large blister formed which then broke open revealing the fat layer underneath. The ulcer on his anterior tibial surface is a little bit larger this week. The lateral leg wound has much better moisture balance this week. Fortunately, prior to his foot wound reopening, he did get the cast made for his orthotic. TOY, SNELGROVE ([id number]) [id number] Page 15 of 20 05/15/2022: Already, the left medial foot wound  has improved. The tissue is less macerated and the surface is clean. The ulcer on his anterior tibial surface continues to enlarge. This seems likely secondary to accumulated moisture. The lateral leg wound continues to have an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

## 2022-09-01 NOTE — Progress Notes (Signed)
[name], [name] ([id number]) [id number] Page 1 of 11 Visit Report for 08/30/2022 Arrival Information Details Patient Name: Date of Service: [name], [name] 08/30/2022 10:45 A M Medical Record Number: [id number] Patient Account Number: [id number] Date of Birth/Sex: Treating RN: [birth year] (72 y.o. M) Primary Care Garnett Nunziata: [name] Other Clinician: Referring Donzella Carrol: Treating Savva Beamer/Extender: [name] in Treatment: 72 Visit Information History Since Last Visit All ordered tests and consults were completed: No Patient Arrived: Ambulatory Added or deleted any medications: No Arrival Time: 10:58 Any new allergies or adverse reactions: No Accompanied By: self Had a fall or experienced change in No Transfer Assistance: None activities of daily living that may affect Patient Identification Verified: Yes risk of falls: Secondary Verification Process Completed: Yes Signs or symptoms of abuse/neglect since last visito No Patient Requires Transmission-Based Precautions: No Hospitalized since last visit: No Patient Has Alerts: Yes Implantable device outside of the clinic excluding No cellular tissue based products placed in the center since last visit: Pain Present Now: No Electronic Signature(s) Signed: 08/30/2022 11:42:40 AM By: Worthy Rancher Entered By: Worthy Rancher on 08/30/2022 10:58:32 -------------------------------------------------------------------------------- Encounter Discharge Information Details Patient Name: Date of Service: [name]. 08/30/2022 10:45 A M Medical Record Number: [id number] Patient Account Number: [id number] Date of Birth/Sex: Treating RN: 1950/10/11 (72 y.o. [name] Primary Care Berma Harts: [name] Other Clinician: Referring Tamy Accardo: Treating Sharmain Lastra/Extender: [name] in Treatment: 72 Encounter Discharge Information Items Post Procedure Vitals Discharge  Condition: Stable Temperature (F): 98.8 Ambulatory Status: Ambulatory Pulse (bpm): 69 Discharge Destination: Home Respiratory Rate (breaths/min): 20 Transportation: Private Auto Blood Pressure (mmHg): 155/78 Accompanied By: self Schedule Follow-up Appointment: Yes Clinical Summary of Care: Electronic Signature(s) Signed: 08/30/2022 5:01:03 PM By: Blanche East RN Entered By: Blanche East on 08/30/2022 12:03:59 -------------------------------------------------------------------------------- Lower Extremity Assessment Details Patient Name: Date of Service: [name], [name] 08/30/2022 10:45 A M Medical Record Number: [id number] Patient Account Number: [id number] Date of Birth/Sex: Treating RN: 1951-05-31 (72 y.o. [name] Primary Care Despina Boan: [name] Other Clinician: Referring Aron Inge: Treating Anthonia Monger/Extender: [name] in Treatment: 72 Edema Assessment Assessed: [name]: No] [name]: No] L[LeftSTRATON, [name] [id number] [Right: [id number] Page 2 of 11] Edema: [Left: Ye] [Right: s] Calf Left: Right: Point of Measurement: 41 cm From Medial Instep 47 cm Ankle Left: Right: Point of Measurement: 10 cm From Medial Instep 32.4 cm Vascular Assessment Pulses: Dorsalis Pedis Palpable: [Left:Yes] Electronic Signature(s) Signed: 08/30/2022 5:01:03 PM By: Blanche East RN Entered By: Blanche East on 08/30/2022 11:16:16 -------------------------------------------------------------------------------- Multi Wound Chart Details Patient Name: Date of Service: [name]. 08/30/2022 10:45 A M Medical Record Number: [id number] Patient Account Number: [id number] Date of Birth/Sex: Treating RN: 03-Mar-1951 (72 y.o. 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 Primary Etiology: Extremity Glaucoma, Sleep Apnea, Glaucoma, Sleep Apnea, Glaucoma, Sleep Apnea, Comorbid History: Hypertension, Peripheral Arterial Hypertension, Peripheral Arterial Hypertension, Peripheral Arterial Disease, Peripheral Venous Disease, Disease, Peripheral Venous Disease, Disease, Peripheral Venous Disease, Type II Diabetes, Gout, Osteoarthritis, Type II Diabetes, Gout, Osteoarthritis, Type II Diabetes, Gout,  Osteoarthritis, Neuropathy Neuropathy Neuropathy 08/23/2020 06/03/2021 06/21/2022 Date Acquired: 72 64 10 Weeks of Treatment: Open Open Open Wound Status: Yes No No Wound Recurrence: No Yes No Clustered Wound: N/A 3 N/A Clustered Quantity: 2.5x3.5x0.1 1.2x0.4x0.1 2.1x0.9x0.1 Measurements L x W x D (cm) 6.872 0.377 1.484 A (cm) : rea 0.687 0.038 0.148 Volume (cm) : 51.40% 77.10% -277.60% % Reduction in Area: 75.70% 97.10% -279.50% % Reduction in Volume: Grade 2 Full Thickness With Exposed Support Full Thickness Without Exposed Classification: Structures Support Structures Medium Medium Medium Exudate Amount: Serosanguineous Serosanguineous Serous Exudate Type: red, brown red, brown [address]YAO, BROSSMAN ([id number]) [id number] Page 3 of 11 Flat and Intact Fibrotic scar, thickened scar Distinct, outline attached Wound Margin: Medium (34-66%) Large (67-100%) Large (67-100%) Granulation Amount: Red Red, Pink Red Granulation Quality: Medium (34-66%) Small (1-33%) Small (1-33%) Necrotic  Amount: Adherent Slough Eschar Adherent Slough Necrotic Tissue: Fat Layer (Subcutaneous Tissue): Yes Fat Layer (Subcutaneous Tissue): Yes Fat Layer (Subcutaneous Tissue): Yes Exposed Structures: Fascia: No Fascia: No Fascia: No Tendon: No Tendon: No Tendon: No Muscle: No Muscle: No Muscle: No Joint: No Joint: No Joint: No Bone: No Bone: No Bone: No None Small (1-33%) Small (1-33%) Epithelialization: Debridement - Selective/Open Wound Debridement - Selective/Open Wound N/A Debridement: Pre-procedure Verification/Time Out 11:40 11:40 N/A Taken: [facility] N/A Tissue Debrided: Non-Viable Tissue Non-Viable Tissue N/A Level: 8.75 0.48 N/A Debridement A (sq cm): rea Curette Curette N/A Instrument: Moderate Moderate N/A Bleeding: Pressure Pressure N/A Hemostasis A chieved: Procedure was tolerated well Procedure was tolerated well N/A Debridement Treatment Response: 2.5x3.5x0.1 1.2x0.4x0.1 N/A Post Debridement Measurements L x W x D (cm) 0.687 0.038 N/A Post Debridement Volume: (cm) Callus: Yes Scarring: Yes No Abnormalities Noted Periwound Skin Texture: Maceration: Yes Dry/Scaly: Yes Maceration: Yes Periwound Skin Moisture: Dry/Scaly: No No Abnormalities Noted Hemosiderin Staining: Yes Hemosiderin Staining: Yes Periwound Skin Color: No Abnormality No Abnormality No Abnormality Temperature: N/A Yes N/A Tenderness on Palpation: Debridement Debridement Paring/cutting of benign hyperkeratotic Procedures Performed: lesion 2-4 Treatment Notes Wound #18R (Metatarsal head first) Wound Laterality: Plantar, Left Cleanser Soap and Water Discharge Instruction: May shower and wash wound with dial antibacterial soap and water prior to dressing change. Wound Cleanser Discharge Instruction: Cleanse the wound with wound cleanser prior to applying a clean dressing using gauze sponges, not tissue or cotton balls. Peri-Wound Care Topical Primary Dressing Sorbalgon  AG Dressing 2x2 (in/in) Discharge Instruction: Apply to wound bed as instructed Secondary Dressing ALLEVYN Heel 4 1/2in x 5 1/2in / 10.5cm x 13.5cm Discharge Instruction: Apply over primary dressing as directed. Optifoam Non-Adhesive Dressing, 4x4 in Discharge Instruction: Apply over primary dressing as directed. Zetuvit Plus Silicone Border Dressing 4x4 (in/in) Discharge Instruction: Apply silicone border over primary dressing as directed. Secured With Compression Wrap Compression Stockings Add-Ons Wound #22 (Lower Leg) Wound Laterality: Left, Lateral, Proximal Cleanser Soap and Water Discharge Instruction: May shower and wash wound with dial antibacterial soap and water prior to dressing change. Wound Cleanser Discharge Instruction: Cleanse the wound with wound cleanser prior to applying a clean dressing using gauze sponges, not tissue or cotton balls. Peri-Wound Care Sween Lotion (Moisturizing lotion) Discharge Instruction: Apply moisturizing lotion to the leg [name], [name] ([id number]) [phone].pdf Page 4 of 11 Topical Skintegrity Hydrogel 4 (oz) Discharge Instruction: Apply hydrogel as directed Primary Dressing Endoform 2x2 in Discharge Instruction: Moisten with Hydrogel or saline Secondary Dressing ABD Pad, 8x10 Discharge Instruction: Apply over primary dressing as directed. Secured With Elastic Bandage 4 inch (ACE bandage) Discharge  Instruction: Secure with ACE bandage as directed. Compression Wrap Compression Stockings Add-Ons Wound #30 (Lower Leg) Wound Laterality: Left, Anterior Cleanser Soap and Water Discharge Instruction: May shower and wash wound with dial antibacterial soap and water prior to dressing change. Wound Cleanser Discharge Instruction: Cleanse the wound with wound cleanser prior to applying a clean dressing using gauze sponges, not tissue or cotton balls. Peri-Wound Care Topical Primary Dressing Sorbalgon AG Dressing  2x2 (in/in) Discharge Instruction: Apply to wound bed as instructed Secondary Dressing Zetuvit Plus Silicone Border Dressing 4x4 (in/in) Discharge Instruction: Apply silicone border over primary dressing as directed. Secured With Compression Wrap CoFlex TLC Zinc Compression Stockings Add-Ons Electronic Signature(s) Signed: 08/30/2022 12:42:12 PM By: Fredirick Maudlin MD FACS Entered By: Fredirick Maudlin on 08/30/2022 12:42:12 -------------------------------------------------------------------------------- Multi-Disciplinary Care Plan Details Patient Name: Date of Service: [name]. 08/30/2022 10:45 A M Medical Record Number: [id number] Patient Account Number: [id number] Date of Birth/Sex: Treating RN: 28-Mar-1951 (72 y.o. [name] Primary Care Nakshatra Klose: [name] Other Clinician: Referring Phoenix Riesen: Treating Siddhi Dornbush/Extender: [name] in Treatment: Ava reviewed with physician Active Inactive Venous Leg Ulcer [name], [name] ([id number]) [id number] Page 5 of 11 Nursing Diagnoses: Knowledge deficit related to disease process and management Potential for venous Insuffiency (use before diagnosis confirmed) Goals: Patient will maintain optimal edema control Date Initiated: 07/27/2021 Target Resolution Date: 02/23/2023 Goal Status: Active Interventions: Assess peripheral edema status every visit. Treatment Activities: Therapeutic compression applied : 07/27/2021 Notes: Wound/Skin Impairment Nursing Diagnoses: Impaired tissue integrity Knowledge deficit related to ulceration/compromised skin integrity Goals: Patient will have a decrease in wound volume by X% from date: (specify in notes) Date Initiated: 04/12/2021 Date Inactivated: 01/04/2022 Target Resolution Date: 04/23/2021 Goal Status: Met Patient/caregiver will verbalize understanding of skin care regimen Date Initiated:  01/04/2022 Target Resolution Date: 02/23/2023 Goal Status: Active Ulcer/skin breakdown will have a volume reduction of 30% by week 4 Date Initiated: 04/12/2021 Date Inactivated: 04/27/2021 Target Resolution Date: 04/27/2021 Goal Status: Unmet Unmet Reason: infection Ulcer/skin breakdown will have a volume reduction of 50% by week 8 Date Initiated: 04/27/2021 Date Inactivated: 06/29/2021 Target Resolution Date: 06/24/2021 Goal Status: Met Interventions: Assess patient/caregiver ability to obtain necessary supplies Assess patient/caregiver ability to perform ulcer/skin care regimen upon admission and as needed Assess ulceration(s) every visit Notes: Electronic Signature(s) Signed: 08/30/2022 5:01:03 PM By: Blanche East RN Entered By: Blanche East on 08/30/2022 11:19:59 -------------------------------------------------------------------------------- Pain Assessment Details Patient Name: Date of Service: [name]. 08/30/2022 10:45 A M Medical Record Number: [id number] Patient Account Number: [id number] Date of Birth/Sex: Treating RN: 12/29/1950 (72 y.o. M) Primary Care Lakeidra Reliford: [name] Other Clinician: Referring Fong Mccarry: Treating Shylyn Younce/Extender: [name] in Treatment: 72 Active Problems Location of Pain Severity and Description of Pain Patient Has Paino Yes Site Locations Rate the pain. [name], [name] ([id number]) [id number] Page 6 of 11 Rate the pain. Current Pain Level: 6 Worst Pain Level: 10 Least Pain Level: 0 Tolerable Pain Level: 2 Pain Management and Medication Current Pain Management: Electronic Signature(s) Signed: 08/30/2022 11:42:40 AM By: Worthy Rancher Entered By: Worthy Rancher on 08/30/2022 10:59:14 -------------------------------------------------------------------------------- Patient/Caregiver Education Details Patient Name: Date of Service: [name] 3/7/2024andnbsp10:45 A M Medical Record  Number: [id number] Patient Account Number: [id number] Date of Birth/Gender: Treating RN: 03/08/1951 (72 y.o. [name] Primary Care Physician: [name] Other Clinician: Referring Physician: Treating Physician/Extender: [name] in Treatment: 58 Education Assessment Education Provided To: Patient Education  Topics Provided Wound Debridement: Methods: Explain/Verbal Responses: Reinforcements needed, State content correctly Wound/Skin Impairment: Methods: Explain/Verbal Responses: Reinforcements needed, State content correctly Electronic Signature(s) Signed: 08/30/2022 5:01:03 PM By: Blanche East RN Entered By: Blanche East on 08/30/2022 11:20:27 -------------------------------------------------------------------------------- Wound Assessment Details Patient Name: Date of Service: [name]. 08/30/2022 10:45 A M Medical Record Number: [id number] Patient Account Number: [id number] Date of Birth/Sex: Treating RN: [birth year] (72 y.o. M) Primary Care Aryel Edelen: [name] Other Clinician: Referring Tamryn Popko: Treating Vanisha Whiten/Extender: [name] in Treatment: 72 Wound Status Wound Number: 18R Primary Diabetic Wound/Ulcer of the Lower Extremity Etiology: Wound Location: Left, Plantar Metatarsal head first [name], [name] ([id number]) [id number] Page 7 of 11 Wound Open Wounding Event: Gradually Appeared Status: Date Acquired: 08/23/2020 Comorbid Glaucoma, Sleep Apnea, Hypertension, Peripheral Arterial Disease, Weeks Of Treatment: 72 History: Peripheral Venous Disease, Type II Diabetes, Gout, Osteoarthritis, Clustered Wound: No Neuropathy Photos Wound Measurements Length: (cm) 2.5 Width: (cm) 3.5 Depth: (cm) 0.1 Area: (cm) 6.872 Volume: (cm) 0.687 % Reduction in Area: 51.4% % Reduction in Volume: 75.7% Epithelialization: None Tunneling: No Undermining: No Wound  Description Classification: Grade 2 Wound Margin: Flat and Intact Exudate Amount: Medium Exudate Type: Serosanguineous Exudate Color: red, brown Foul Odor After Cleansing: No Slough/Fibrino Yes Wound Bed Granulation Amount: Medium (34-66%) Exposed Structure Granulation Quality: Red Fascia Exposed: No Necrotic Amount: Medium (34-66%) Fat Layer (Subcutaneous Tissue) Exposed: Yes Necrotic Quality: Adherent Slough Tendon Exposed: No Muscle Exposed: No Joint Exposed: No Bone Exposed: No Periwound Skin Texture Texture Color No Abnormalities Noted: No No Abnormalities Noted: Yes Callus: Yes Temperature / Pain Temperature: No Abnormality Moisture No Abnormalities Noted: No Dry / Scaly: No Maceration: Yes Treatment Notes Wound #18R (Metatarsal head first) Wound Laterality: Plantar, Left Cleanser Soap and Water Discharge Instruction: May shower and wash wound with dial antibacterial soap and water prior to dressing change. Wound Cleanser Discharge Instruction: Cleanse the wound with wound cleanser prior to applying a clean dressing using gauze sponges, not tissue or cotton balls. Peri-Wound Care Topical Primary Dressing Sorbalgon AG Dressing 2x2 (in/in) Discharge Instruction: Apply to wound bed as instructed Secondary Dressing ALLEVYN Heel 4 1/2in x 5 1/2in / 10.5cm x 13.5cm Discharge Instruction: Apply over primary dressing as directed. [name], [name] ([id number]) [id number] Page 8 of 11 Optifoam Non-Adhesive Dressing, 4x4 in Discharge Instruction: Apply over primary dressing as directed. Zetuvit Plus Silicone Border Dressing 4x4 (in/in) Discharge Instruction: Apply silicone border over primary dressing as directed. Secured With Compression Wrap Compression Stockings Environmental education officer) Signed: 08/30/2022 5:01:03 PM By: Blanche East RN Entered By: Blanche East on 08/30/2022  11:22:30 -------------------------------------------------------------------------------- Wound Assessment Details Patient Name: Date of Service: [name]. 08/30/2022 10:45 A M Medical Record Number: [id number] Patient Account Number: [id number] Date of Birth/Sex: Treating RN: 19-Dec-1950 (72 y.o. [name] Primary Care Tareek Sabo: [name] Other Clinician: Referring Othello Dickenson: Treating Shyler Holzman/Extender: [name] in Treatment: 72 Wound Status Wound Number: 22 Primary Cyst Etiology: Wound Location: Left, Proximal, Lateral Lower Leg Wound Open Wounding Event: Bump Status: Date Acquired: 06/03/2021 Comorbid Glaucoma, Sleep Apnea, Hypertension, Peripheral Arterial Disease, Weeks Of Treatment: 64 History: Peripheral Venous Disease, Type II Diabetes, Gout, Osteoarthritis, Clustered Wound: Yes Neuropathy Wound Measurements Length: (cm) Width: (cm) Depth: (cm) Clustered Quantity: Area: (cm) Volume: (cm) 1.2 % Reduction in Area: 77.1% 0.4 % Reduction in Volume: 97.1% 0.1 Epithelialization: Small (1-33%) 3 Tunneling: No 0.377 Undermining: No 0.038 Wound Description Classification: Full Thickness With Exposed Support Structures Wound  Margin: Fibrotic scar, thickened scar Exudate Amount: Medium Exudate Type: Serosanguineous Exudate Color: red, brown Foul Odor After Cleansing: No Slough/Fibrino Yes Wound Bed Granulation Amount: Large (67-100%) Exposed Structure Granulation Quality: Red, Pink Fascia Exposed: No Necrotic Amount: Small (1-33%) Fat Layer (Subcutaneous Tissue) Exposed: Yes Necrotic Quality: Eschar Tendon Exposed: No Muscle Exposed: No Joint Exposed: No Bone Exposed: No Periwound Skin Texture Texture Color No Abnormalities Noted: No No Abnormalities Noted: No Scarring: Yes Hemosiderin Staining: Yes Moisture Temperature / Pain No Abnormalities Noted: No Temperature: No Abnormality Dry / Scaly: Yes Tenderness on  Palpation: Yes Treatment Notes Wound #22 (Lower Leg) Wound Laterality: Left, Lateral, Proximal [name] ([id number]) [id number] Page 9 of 11 Cleanser Soap and Water Discharge Instruction: May shower and wash wound with dial antibacterial soap and water prior to dressing change. Wound Cleanser Discharge Instruction: Cleanse the wound with wound cleanser prior to applying a clean dressing using gauze sponges, not tissue or cotton balls. Peri-Wound Care Sween Lotion (Moisturizing lotion) Discharge Instruction: Apply moisturizing lotion to the leg Topical Skintegrity Hydrogel 4 (oz) Discharge Instruction: Apply hydrogel as directed Primary Dressing Endoform 2x2 in Discharge Instruction: Moisten with Hydrogel or saline Secondary Dressing ABD Pad, 8x10 Discharge Instruction: Apply over primary dressing as directed. Secured With Elastic Bandage 4 inch (ACE bandage) Discharge Instruction: Secure with ACE bandage as directed. Compression Wrap Compression Stockings Add-Ons Electronic Signature(s) Signed: 08/30/2022 5:01:03 PM By: Blanche East RN Entered By: Blanche East on 08/30/2022 11:21:46 -------------------------------------------------------------------------------- Wound Assessment Details Patient Name: Date of Service: [name]. 08/30/2022 10:45 A M Medical Record Number: [id number] Patient Account Number: [id number] Date of Birth/Sex: Treating RN: 08/18/1950 (72 y.o. M) Primary Care Curtina Grills: [name] Other Clinician: Referring Lavere Shinsky: Treating Shahad Mazurek/Extender: [name] in Treatment: 72 Wound Status Wound Number: 30 Primary Abrasion Etiology: Wound Location: Left, Anterior Lower Leg Wound Open Wounding Event: Shear/Friction Status: Date Acquired: 06/21/2022 Comorbid Glaucoma, Sleep Apnea, Hypertension, Peripheral Arterial Disease, Weeks Of Treatment: 10 History: Peripheral Venous Disease, Type II  Diabetes, Gout, Osteoarthritis, Clustered Wound: No Neuropathy Photos Wound Measurements Length: (cm) 2.1 Width: (cm) 0.9 Dell, Arnette Norris ([id number]) Depth: (cm) 0.1 Area: (cm) 1.484 Volume: (cm) 0.148 % Reduction in Area: -277.6% % Reduction in Volume: -279.5% [id number] Page 10 of 11 Epithelialization: Small (1-33%) Tunneling: No Undermining: No Wound Description Classification: Full Thickness Without Exposed Support Structures Wound Margin: Distinct, outline attached Exudate Amount: Medium Exudate Type: Serous Exudate Color: amber Foul Odor After Cleansing: No Slough/Fibrino Yes Wound Bed Granulation Amount: Large (67-100%) Exposed Structure Granulation Quality: Red Fascia Exposed: No Necrotic Amount: Small (1-33%) Fat Layer (Subcutaneous Tissue) Exposed: Yes Necrotic Quality: Adherent Slough Tendon Exposed: No Muscle Exposed: No Joint Exposed: No Bone Exposed: No Periwound Skin Texture Texture Color No Abnormalities Noted: Yes No Abnormalities Noted: No Hemosiderin Staining: Yes Moisture No Abnormalities Noted: No Temperature / Pain Maceration: Yes Temperature: No Abnormality Treatment Notes Wound #30 (Lower Leg) Wound Laterality: Left, Anterior Cleanser Soap and Water Discharge Instruction: May shower and wash wound with dial antibacterial soap and water prior to dressing change. Wound Cleanser Discharge Instruction: Cleanse the wound with wound cleanser prior to applying a clean dressing using gauze sponges, not tissue or cotton balls. Peri-Wound Care Topical Primary Dressing Sorbalgon AG Dressing 2x2 (in/in) Discharge Instruction: Apply to wound bed as instructed Secondary Dressing Zetuvit Plus Silicone Border Dressing 4x4 (in/in) Discharge Instruction: Apply silicone border over primary dressing as directed. Secured With Compression Wrap CoFlex TLC Zinc Compression Stockings Add-Ons  Electronic Signature(s) Signed:  08/30/2022 5:01:03 PM By: Blanche East RN Entered By: Blanche East on 08/30/2022 11:22:50 -------------------------------------------------------------------------------- Vitals Details Patient Name: Date of Service: [name]. 08/30/2022 10:45 A M Medical Record Number: [id number] Patient Account Number: [id number] Date of Birth/Sex: Treating RN: Oct 24, 1950 (72 y.o. M) Primary Care Abram Sax: [name] Other Clinician: Referring Genesia Caslin: Treating Evyn Putzier/Extender: [name] in Treatment: [address] HAVOC, GOULETTE ([id number]) [id number] Page 11 of 11 Time Taken: 10:58 Temperature (F): 98.8 Height (in): 74 Pulse (bpm): 69 Weight (lbs): 238 Respiratory Rate (breaths/min): 20 Body Mass Index (BMI): 30.6 Blood Pressure (mmHg): 155/78 Capillary Blood Glucose (mg/dl): 145 Reference Range: 80 - 120 mg / dl Electronic Signature(s) Signed: 08/30/2022 11:42:40 AM By: Worthy Rancher Entered By: Worthy Rancher on 08/30/2022 10:58:57

## 2022-09-07 ENCOUNTER — Encounter (HOSPITAL_BASED_OUTPATIENT_CLINIC_OR_DEPARTMENT_OTHER): Payer: Medicare Other | Admitting: Internal Medicine

## 2022-09-07 DIAGNOSIS — I890 Lymphedema, not elsewhere classified: Secondary | ICD-10-CM | POA: Diagnosis not present

## 2022-09-07 DIAGNOSIS — I87322 Chronic venous hypertension (idiopathic) with inflammation of left lower extremity: Secondary | ICD-10-CM | POA: Diagnosis not present

## 2022-09-07 DIAGNOSIS — E1151 Type 2 diabetes mellitus with diabetic peripheral angiopathy without gangrene: Secondary | ICD-10-CM

## 2022-09-07 DIAGNOSIS — L97528 Non-pressure chronic ulcer of other part of left foot with other specified severity: Secondary | ICD-10-CM | POA: Diagnosis not present

## 2022-09-07 DIAGNOSIS — L97828 Non-pressure chronic ulcer of other part of left lower leg with other specified severity: Secondary | ICD-10-CM | POA: Diagnosis not present

## 2022-09-13 ENCOUNTER — Encounter (HOSPITAL_BASED_OUTPATIENT_CLINIC_OR_DEPARTMENT_OTHER): Payer: Medicare Other | Admitting: General Surgery

## 2022-09-13 DIAGNOSIS — I87322 Chronic venous hypertension (idiopathic) with inflammation of left lower extremity: Secondary | ICD-10-CM | POA: Diagnosis not present

## 2022-09-14 NOTE — Progress Notes (Signed)
[name], [name] ([id number]) [id number] Page 1 of 10 Visit Report for 09/13/2022 Arrival Information Details Patient Name: Date of Service: [name], [name] 09/13/2022 10:45 A M Medical Record Number: [id number] Patient Account Number: [id number] Date of Birth/Sex: Treating RN: 1950-09-08 (72 y.o. M) Primary Care Elray Dains: [name] Other Clinician: Referring Jenese Mischke: Treating Warden Buffa/Extender: [name] in Treatment: 18 Visit Information History Since Last Visit Added or deleted any medications: No Patient Arrived: Ambulatory Any new allergies or adverse reactions: No Arrival Time: 11:07 Had a fall or experienced change in No Accompanied By: self activities of daily living that may affect Transfer Assistance: None risk of falls: Patient Identification Verified: Yes Signs or symptoms of abuse/neglect since last visito No Secondary Verification Process Completed: Yes Hospitalized since last visit: No Patient Requires Transmission-Based Precautions: No Implantable device outside of the clinic excluding No Patient Has Alerts: Yes cellular tissue based products placed in the center since last visit: Has Compression in Place as Prescribed: Yes Pain Present Now: No Electronic Signature(s) Signed: 09/13/2022 11:29:36 AM By: Worthy Rancher Entered By: Worthy Rancher on 09/13/2022 11:07:31 -------------------------------------------------------------------------------- Compression Therapy Details Patient Name: Date of Service: [name], [name] 09/13/2022 10:45 A M Medical Record Number: [id number] Patient Account Number: [id number] Date of Birth/Sex: Treating RN: Nov 09, 1950 (72 y.o. M) Primary Care Hudsen Fei: [name] Other Clinician: Referring Anmarie Fukushima: Treating Kaeley Vinje/Extender: [name] in Treatment: 74 Compression Therapy Performed for Wound Assessment: Wound #18R Left,Plantar Metatarsal head  first Performed By: Clinician , Compression Type: Double Layer Post Procedure Diagnosis Same as Pre-procedure Notes Zinc CoFlex Electronic Signature(s) Signed: 09/13/2022 5:17:55 PM By: Worthy Rancher Entered By: Worthy Rancher on 09/13/2022 11:51:03 -------------------------------------------------------------------------------- Encounter Discharge Information Details Patient Name: Date of Service: [name]. 09/13/2022 10:45 A M Medical Record Number: [id number] Patient Account Number: [id number] Date of Birth/Sex: Treating RN: 07-28-1950 (72 y.o. M) Primary Care Kelsen Celona: [name] Other Clinician: Referring Tally Mattox: Treating Cy Bresee/Extender: [name] in Treatment: 59 Encounter Discharge Information Items Post Procedure Vitals Discharge Condition: Stable Temperature (F): 97.8 Ambulatory Status: Ambulatory Pulse (bpm): [address] ([id number]) [id number] Page 2 of 10 Discharge Destination: Home Respiratory Rate (breaths/min): 18 Transportation: Private Auto Blood Pressure (mmHg): 142/75 Accompanied By: self Schedule Follow-up Appointment: Yes Clinical Summary of Care: Electronic Signature(s) Signed: 09/13/2022 5:17:55 PM By: Worthy Rancher Entered By: Worthy Rancher on 09/13/2022 11:52:47 -------------------------------------------------------------------------------- Lower Extremity Assessment Details Patient Name: Date of Service: [name], [name] 09/13/2022 10:45 A M Medical Record Number: [id number] Patient Account Number: [id number] Date of Birth/Sex: Treating RN: December 21, 1950 (72 y.o. M) Primary Care Ramey Schiff: [name] Other Clinician: Referring Deena Shaub: Treating Muhamad Serano/Extender: [name] in Treatment: 74 Edema Assessment Assessed: [Left: No] [Right: No] Edema: [Left: Ye] [Right: s] Calf Left: Right: Point of Measurement: 41 cm From Medial Instep 44.5 cm Ankle Left:  Right: Point of Measurement: 10 cm From Medial Instep 30 cm Vascular Assessment Pulses: Dorsalis Pedis Palpable: [Left:Yes] Electronic Signature(s) Signed: 09/13/2022 11:29:36 AM By: Worthy Rancher Entered By: Worthy Rancher on 09/13/2022 11:17:57 -------------------------------------------------------------------------------- Multi Wound Chart Details Patient Name: Date of Service: [name]. 09/13/2022 10:45 A M Medical Record Number: [id number] Patient Account Number: [id number] Date of Birth/Sex: Treating RN: Mar 15, 1951 (72 y.o. M) Primary Care [name]: [name] Other Clinician: Referring Seynabou Fults: Treating Margarito Dehaas/Extender: [name] in Treatment: 79 Vital Signs Height(in): 74 Capillary Blood Glucose(mg/dl): 148 Weight(lbs): 238 Pulse(bpm): 78 Body Mass Index(BMI): 30.6  Blood Pressure(mmHg): 142/75 Temperature(F): 97.8 Respiratory Rate(breaths/min): 18 [18R:Photos:] [N/A:N/A [id number] Page 3 of 10] Left, Plantar Metatarsal head first Left, Proximal, Lateral Lower Leg N/A Wound Location: Gradually Appeared Bump N/A Wounding Event: Diabetic Wound/Ulcer of the Lower Cyst N/A Primary Etiology: Extremity Glaucoma, Sleep Apnea, Glaucoma, Sleep Apnea, N/A Comorbid History: Hypertension, Peripheral Arterial Hypertension, Peripheral Arterial Disease, Peripheral Venous Disease, Disease, Peripheral Venous Disease, Type II Diabetes, Gout, Osteoarthritis, Type II Diabetes, Gout, Osteoarthritis, Neuropathy Neuropathy 08/23/2020 06/03/2021 N/A Date Acquired: 74 66 N/A Weeks of Treatment: Open Open N/A Wound Status: Yes No N/A Wound Recurrence: No Yes N/A Clustered Wound: N/A 3 N/A Clustered Quantity: 1x2.3x0.1 2.4x0.9x0.1 N/A Measurements L x W x D (cm) 1.806 1.696 N/A A (cm) : rea 0.181 0.17 N/A Volume (cm) : 87.20% -2.90% N/A % Reduction in A rea: 93.60% 87.10% N/A % Reduction in Volume: Grade 2  Full Thickness With Exposed Support N/A Classification: Structures Medium Medium N/A Exudate A mount: Serosanguineous Serosanguineous N/A Exudate Type: red, brown red, brown N/A Exudate Color: Flat and Intact Fibrotic scar, thickened scar N/A Wound Margin: Large (67-100%) Large (67-100%) N/A Granulation A mount: Red Red, Pink N/A Granulation Quality: Small (1-33%) Small (1-33%) N/A Necrotic A mount: Fat Layer (Subcutaneous Tissue): Yes Fat Layer (Subcutaneous Tissue): Yes N/A Exposed Structures: Fascia: No Fascia: No Tendon: No Tendon: No Muscle: No Muscle: No Joint: No Joint: No Bone: No Bone: No Small (1-33%) Small (1-33%) N/A Epithelialization: Debridement - Selective/Open Wound Debridement - Selective/Open Wound N/A Debridement: Pre-procedure Verification/Time Out 11:35 11:35 N/A Taken: Lidocaine 5% topical ointment Lidocaine 5% topical ointment N/A Pain Control: Necrotic/Eschar, [employer] N/A Tissue Debrided: Non-Viable Tissue Non-Viable Tissue N/A Level: 2.3 2.16 N/A Debridement A (sq cm): rea Curette Curette N/A Instrument: Minimum Minimum N/A Bleeding: Pressure Pressure N/A Hemostasis A chieved: Procedure was tolerated well Procedure was tolerated well N/A Debridement Treatment Response: 1x2.3x0.1 2.4x0.9x0.1 N/A Post Debridement Measurements L x W x D (cm) 0.181 0.17 N/A Post Debridement Volume: (cm) Callus: Yes Scarring: Yes N/A Periwound Skin Texture: Maceration: No Dry/Scaly: Yes N/A Periwound Skin Moisture: Dry/Scaly: No No Abnormalities Noted Hemosiderin Staining: Yes N/A Periwound Skin Color: No Abnormality No Abnormality N/A Temperature: Compression Therapy Debridement N/A Procedures Performed: Debridement Treatment Notes Wound #18R (Metatarsal head first) Wound Laterality: Plantar, Left Cleanser Soap and Water Discharge Instruction: May shower and wash wound with dial antibacterial soap and water prior to dressing  change. Wound Cleanser Discharge Instruction: Cleanse the wound with wound cleanser prior to applying a clean dressing using gauze sponges, not tissue or cotton balls. Peri-Wound Care Topical Primary Dressing Sorbalgon AG Dressing 2x2 (in/in) Discharge Instruction: Apply to wound bed as instructed Secondary Dressing Optifoam Non-Adhesive Dressing, 4x4 in Discharge Instruction: Apply over primary dressing as directed. JUERGEN, SEBREE ([id number]) [id number] Page 4 of 10 Zetuvit Plus 4x8 in Discharge Instruction: Apply over primary dressing as directed. Secured With Compression Wrap CoFlex [employer]inc [employer], 4 x 6 (in/yd) Discharge Instruction: Apply Coflex [employer] as directed. Compression Stockings Add-Ons Wound #22 (Lower Leg) Wound Laterality: Left, Lateral, Proximal Cleanser Soap and Water Discharge Instruction: May shower and wash wound with dial antibacterial soap and water prior to dressing change. Wound Cleanser Discharge Instruction: Cleanse the wound with wound cleanser prior to applying a clean dressing using gauze sponges, not tissue or cotton balls. Peri-Wound Care Sween Lotion (Moisturizing lotion) Discharge Instruction: Apply moisturizing lotion to the leg Topical Skintegrity Hydrogel 4 (oz) Discharge Instruction: Apply hydrogel as directed Primary Dressing Endoform 2x2  in Discharge Instruction: Moisten with Hydrogel or saline Secondary Dressing ABD Pad, 8x10 Discharge Instruction: Apply over primary dressing as directed. Secured With Elastic Bandage 4 inch (ACE bandage) Discharge Instruction: Secure with ACE bandage as directed. Compression Wrap Compression Stockings Add-Ons Electronic Signature(s) Signed: 09/13/2022 11:58:13 AM By: Fredirick Maudlin MD FACS Entered By: Fredirick Maudlin on 09/13/2022 11:58:13 -------------------------------------------------------------------------------- Multi-Disciplinary Care Plan  Details Patient Name: Date of Service: [name]. 09/13/2022 10:45 A M Medical Record Number: [id number] Patient Account Number: [id number] Date of Birth/Sex: Treating RN: 12/08/1950 (72 y.o. [name] Primary Care Blayze Haen: [name] Other Clinician: Referring Dimond Crotty: Treating Ovadia Lopp/Extender: [name] in Treatment: 23 [place] reviewed with physician Active Inactive Venous Leg Ulcer Nursing Diagnoses: Knowledge deficit related to disease process and management GAUTHAM, ROOSEVELT ([id number]) [id number].pdf Page 5 of 10 Potential for venous Insuffiency (use before diagnosis confirmed) Goals: Patient will maintain optimal edema control Date Initiated: 07/27/2021 Target Resolution Date: 02/23/2023 Goal Status: Active Interventions: Assess peripheral edema status every visit. Treatment Activities: Therapeutic compression applied : 07/27/2021 Notes: Wound/Skin Impairment Nursing Diagnoses: Impaired tissue integrity Knowledge deficit related to ulceration/compromised skin integrity Goals: Patient will have a decrease in wound volume by X% from date: (specify in notes) Date Initiated: 04/12/2021 Date Inactivated: 01/04/2022 Target Resolution Date: 04/23/2021 Goal Status: Met Patient/caregiver will verbalize understanding of skin care regimen Date Initiated: 01/04/2022 Target Resolution Date: 02/23/2023 Goal Status: Active Ulcer/skin breakdown will have a volume reduction of 30% by week 4 Date Initiated: 04/12/2021 Date Inactivated: 04/27/2021 Target Resolution Date: 04/27/2021 Goal Status: Unmet Unmet Reason: infection Ulcer/skin breakdown will have a volume reduction of 50% by week 8 Date Initiated: 04/27/2021 Date Inactivated: 06/29/2021 Target Resolution Date: 06/24/2021 Goal Status: Met Interventions: Assess patient/caregiver ability to obtain necessary supplies Assess patient/caregiver ability  to perform ulcer/skin care regimen upon admission and as needed Assess ulceration(s) every visit Notes: Electronic Signature(s) Signed: 09/13/2022 11:25:09 AM By: Blanche East RN Entered By: Blanche East on 09/13/2022 11:25:08 -------------------------------------------------------------------------------- Pain Assessment Details Patient Name: Date of Service: [name]. 09/13/2022 10:45 A M Medical Record Number: [id number] Patient Account Number: [id number] Date of Birth/Sex: Treating RN: [birth year] (72 y.o. M) Primary Care Athelene Hursey: [name] Other Clinician: Referring Verlean Allport: Treating Kalvyn Desa/Extender: [name] in Treatment: 60 Active Problems Location of Pain Severity and Description of Pain Patient Has Paino No Site Locations Rate the pain. DOMONIC, BUCHINGER ([id number]) [id number] Page 6 of 10 Rate the pain. Current Pain Level: 0 Pain Management and Medication Current Pain Management: Electronic Signature(s) Signed: 09/13/2022 11:29:36 AM By: Worthy Rancher Entered By: Worthy Rancher on 09/13/2022 11:08:48 -------------------------------------------------------------------------------- Patient/Caregiver Education Details Patient Name: Date of Service: [name] 3/21/2024andnbsp10:45 A M Medical Record Number: [id number] Patient Account Number: [id number] Date of Birth/Gender: Treating RN: [birth year] (72 y.o. [name] Primary Care Physician: [name] Other Clinician: Referring Physician: Treating Physician/Extender: [name] in Treatment: 43 Education Assessment Education Provided To: Patient Education Topics Provided Wound Debridement: Methods: Explain/Verbal Responses: Reinforcements needed Wound/Skin Impairment: Methods: Explain/Verbal Responses: Reinforcements needed, State content correctly Electronic Signature(s) Signed: 09/13/2022 4:19:16 PM By: Blanche East RN Entered By: Blanche East on 09/13/2022 11:25:50 -------------------------------------------------------------------------------- Wound Assessment Details Patient Name: Date of Service: [name]. 09/13/2022 10:45 A M Medical Record Number: [id number] Patient Account Number: [id number] Date of Birth/Sex: Treating RN: 1951/05/28 (72 y.o. M) Primary Care Jacody Beneke: [name] Other Clinician: Referring Tecora Eustache: Treating Eathan Groman/Extender: Celine Ahr  Lindaann Pascal, Carloyn Manner Weeks in Treatment: 74 Wound Status Wound Number: 18R Primary Diabetic Wound/Ulcer of the Lower Extremity Etiology: Wound Location: Left, Plantar Metatarsal head first MALEKHI, [name] ([id number]) [phone].pdf Page 7 of 10 Wound Open Wounding Event: Gradually Appeared Status: Date Acquired: 08/23/2020 Comorbid Glaucoma, Sleep Apnea, Hypertension, Peripheral Arterial Disease, Weeks Of Treatment: 74 History: Peripheral Venous Disease, Type II Diabetes, Gout, Osteoarthritis, Clustered Wound: No Neuropathy Photos Wound Measurements Length: (cm) 1 Width: (cm) 2.3 Depth: (cm) 0.1 Area: (cm) 1.806 Volume: (cm) 0.181 % Reduction in Area: 87.2% % Reduction in Volume: 93.6% Epithelialization: Small (1-33%) Tunneling: No Undermining: No Wound Description Classification: Grade 2 Wound Margin: Flat and Intact Exudate Amount: Medium Exudate Type: Serosanguineous Exudate Color: red, brown Foul Odor After Cleansing: No Slough/Fibrino Yes Wound Bed Granulation Amount: Large (67-100%) Exposed Structure Granulation Quality: Red Fascia Exposed: No Necrotic Amount: Small (1-33%) Fat Layer (Subcutaneous Tissue) Exposed: Yes Necrotic Quality: Adherent Slough Tendon Exposed: No Muscle Exposed: No Joint Exposed: No Bone Exposed: No Periwound Skin Texture Texture Color No Abnormalities Noted: No No Abnormalities Noted: Yes Callus: Yes Temperature / Pain Temperature: No  Abnormality Moisture No Abnormalities Noted: No Dry / Scaly: No Maceration: No Treatment Notes Wound #18R (Metatarsal head first) Wound Laterality: Plantar, Left Cleanser Soap and Water Discharge Instruction: May shower and wash wound with dial antibacterial soap and water prior to dressing change. Wound Cleanser Discharge Instruction: Cleanse the wound with wound cleanser prior to applying a clean dressing using gauze sponges, not tissue or cotton balls. Peri-Wound Care Topical Primary Dressing Sorbalgon AG Dressing 2x2 (in/in) Discharge Instruction: Apply to wound bed as instructed Secondary Dressing Optifoam Non-Adhesive Dressing, 4x4 in Discharge Instruction: Apply over primary dressing as directed. TALVIN, BOREY ([id number]) [id number] Page 8 of 10 Zetuvit Plus 4x8 in Discharge Instruction: Apply over primary dressing as directed. Secured With Compression Wrap CoFlex [employer], 4 x 6 (in/yd) Discharge Instruction: Apply Coflex [employer] as directed. Compression Stockings Add-Ons Electronic Signature(s) Signed: 09/13/2022 4:19:16 PM By: Blanche East RN Entered By: Blanche East on 09/13/2022 11:19:31 -------------------------------------------------------------------------------- Wound Assessment Details Patient Name: Date of Service: RORAN, RELF 09/13/2022 10:45 A M Medical Record Number: [id number] Patient Account Number: [id number] Date of Birth/Sex: Treating RN: 1951-05-14 (72 y.o. M) Primary Care Shahin Knierim: [name] Other Clinician: Referring Janette Harvie: Treating Dow Blahnik/Extender: [name] in Treatment: 74 Wound Status Wound Number: 22 Primary Cyst Etiology: Wound Location: Left, Proximal, Lateral Lower Leg Wound Open Wounding Event: Bump Status: Date Acquired: 06/03/2021 Comorbid Glaucoma, Sleep Apnea, Hypertension, Peripheral Arterial Disease, Weeks Of Treatment: 66 History: Peripheral  Venous Disease, Type II Diabetes, Gout, Osteoarthritis, Clustered Wound: Yes Neuropathy Photos Wound Measurements Length: (cm) Width: (cm) Depth: (cm) Clustered Quantity: Area: (cm) Volume: (cm) 2.4 % Reduction in Area: -2.9% 0.9 % Reduction in Volume: 87.1% 0.1 Epithelialization: Small (1-33%) 3 Tunneling: No 1.696 Undermining: No 0.17 Wound Description Classification: Full Thickness With Exposed Suppo Wound Margin: Fibrotic scar, thickened scar Exudate Amount: Medium Exudate Type: Serosanguineous Exudate Color: red, brown rt Structures Foul Odor After Cleansing: No Slough/Fibrino Yes Wound Bed Granulation Amount: Large (67-100%) Exposed Structure Granulation Quality: Red, Pink Fascia Exposed: No Necrotic Amount: Small (1-33%) Fat Layer (Subcutaneous Tissue) Exposed: Yes Necrotic Quality: Adherent Slough Tendon Exposed: No Muscle Exposed: No Joint Exposed: No Bone Exposed: No [name] ([id number]) [id number] Page 9 of 10 Periwound Skin Texture Texture Color No Abnormalities Noted: No No Abnormalities Noted: No Scarring: Yes Hemosiderin Staining: Yes Moisture  Temperature / Pain No Abnormalities Noted: No Temperature: No Abnormality Dry / Scaly: Yes Treatment Notes Wound #22 (Lower Leg) Wound Laterality: Left, Lateral, Proximal Cleanser Soap and Water Discharge Instruction: May shower and wash wound with dial antibacterial soap and water prior to dressing change. Wound Cleanser Discharge Instruction: Cleanse the wound with wound cleanser prior to applying a clean dressing using gauze sponges, not tissue or cotton balls. Peri-Wound Care Sween Lotion (Moisturizing lotion) Discharge Instruction: Apply moisturizing lotion to the leg Topical Skintegrity Hydrogel 4 (oz) Discharge Instruction: Apply hydrogel as directed Primary Dressing Endoform 2x2 in Discharge Instruction: Moisten with Hydrogel or saline Secondary Dressing ABD  Pad, 8x10 Discharge Instruction: Apply over primary dressing as directed. Secured With Elastic Bandage 4 inch (ACE bandage) Discharge Instruction: Secure with ACE bandage as directed. Compression Wrap Compression Stockings Add-Ons Electronic Signature(s) Signed: 09/13/2022 4:19:16 PM By: Blanche East RN Entered By: Blanche East on 09/13/2022 11:20:01 -------------------------------------------------------------------------------- Vitals Details Patient Name: Date of Service: [name]. 09/13/2022 10:45 A M Medical Record Number: [id number] Patient Account Number: [id number] Date of Birth/Sex: Treating RN: 03/03/1951 (72 y.o. M) Primary Care Marlette Curvin: [name] Other Clinician: Referring Kailan Laws: Treating Elana Jian/Extender: [name] in Treatment: 86 Vital Signs Time Taken: 11:07 Temperature (F): 97.8 Height (in): 74 Pulse (bpm): 78 Weight (lbs): 238 Respiratory Rate (breaths/min): 18 Body Mass Index (BMI): 30.6 Blood Pressure (mmHg): 142/75 Capillary Blood Glucose (mg/dl): 148 Reference Range: 80 - 120 mg / dl Electronic Signature(s) Signed: 09/13/2022 11:29:36 AM By: Worthy Rancher Entered By: Worthy Rancher on 09/13/2022 11:08:39 [name] ([id number] Page 10 of 10

## 2022-09-14 NOTE — Progress Notes (Signed)
TYANTHONY, SATHRE ([id number]) [id number] Page 1 of 20 Visit Report for 09/13/2022 Chief Complaint Document Details Patient Name: Date of Service: [name], [name] 09/13/2022 10:45 A M Medical Record Number: [id number] Patient Account Number: [id number] Date of Birth/Sex: Treating RN: 26-Oct-1950 (72 y.o. M) Primary Care Provider: [name] Other Clinician: Referring Provider: Treating Provider/Extender: [name] in Treatment: 74 Information Obtained from: Patient Chief Complaint Left leg and foot ulcers 04/12/2021; patient is here for wounds on his left lower leg and left plantar foot over the first metatarsal head Electronic Signature(s) Signed: 09/13/2022 11:59:59 AM By: Fredirick Maudlin MD FACS Entered By: Fredirick Maudlin on 09/13/2022 11:59:59 -------------------------------------------------------------------------------- Debridement Details Patient Name: Date of Service: [name]. 09/13/2022 10:45 A M Medical Record Number: [id number] Patient Account Number: [id number] Date of Birth/Sex: Treating RN: 03-May-1951 (72 y.o. M) Primary Care Provider: [name] Other Clinician: Referring Provider: Treating Provider/Extender: [name] in Treatment: 74 Debridement Performed for Assessment: Wound #[id number] Left,Plantar Metatarsal head first Performed By: Physician Fredirick Maudlin, MD Debridement Type: Debridement Severity of Tissue Pre Debridement: Fat layer exposed Level of Consciousness (Pre-procedure): Awake and Alert Pre-procedure Verification/Time Out Yes - 11:35 Taken: Start Time: 11:36 Pain Control: Lidocaine 5% topical ointment T Area Debrided (L x W): otal 1 (cm) x 2.3 (cm) = 2.3 (cm) Tissue and other material debrided: Non-Viable, Eschar, Slough, Slough Level: Non-Viable Tissue Debridement Description: Selective/Open Wound Instrument: Curette Bleeding: Minimum Hemostasis Achieved:  Pressure Response to Treatment: Procedure was tolerated well Level of Consciousness (Post- Awake and Alert procedure): Post Debridement Measurements of Total Wound Length: (cm) 1 Width: (cm) 2.3 Depth: (cm) 0.1 Volume: (cm) 0.181 Character of Wound/Ulcer Post Debridement: Requires Further Debridement Severity of Tissue Post Debridement: Fat layer exposed Post Procedure Diagnosis Same as Pre-procedure Notes Scribed for Dr. Celine Ahr by Blanche East, RN Electronic Signature(s) Signed: 09/13/2022 12:41:48 PM By: Fredirick Maudlin MD FACS Signed: 09/13/2022 5:17:55 PM By: Miguel Rota ([id number]) [id number] Page 2 of 20 Entered By: Worthy Rancher on 09/13/2022 11:37:50 -------------------------------------------------------------------------------- Debridement Details Patient Name: Date of Service: VARNELL, MARUYAMA 09/13/2022 10:45 A M Medical Record Number: [id number] Patient Account Number: [id number] Date of Birth/Sex: Treating RN: 1950/09/03 (72 y.o. M) Primary Care Provider: [name] Other Clinician: Referring Provider: Treating Provider/Extender: [name] in Treatment: 74 Debridement Performed for Assessment: Wound #22 Left,Proximal,Lateral Lower Leg Performed By: Physician Fredirick Maudlin, MD Debridement Type: Debridement Level of Consciousness (Pre-procedure): Awake and Alert Pre-procedure Verification/Time Out Yes - 11:35 Taken: Start Time: 11:36 Pain Control: Lidocaine 5% topical ointment T Area Debrided (L x W): otal 2.4 (cm) x 0.9 (cm) = 2.16 (cm) Tissue and other material debrided: Non-Viable, Slough, Slough Level: Non-Viable Tissue Debridement Description: Selective/Open Wound Instrument: Curette Bleeding: Minimum Hemostasis Achieved: Pressure Response to Treatment: Procedure was tolerated well Level of Consciousness (Post- Awake and Alert procedure): Post Debridement Measurements of Total  Wound Length: (cm) 2.4 Width: (cm) 0.9 Depth: (cm) 0.1 Volume: (cm) 0.17 Character of Wound/Ulcer Post Debridement: Requires Further Debridement Post Procedure Diagnosis Same as Pre-procedure Notes Scribed for Dr. Celine Ahr by Blanche East, RN Electronic Signature(s) Signed: 09/13/2022 12:41:48 PM By: Fredirick Maudlin MD FACS Signed: 09/13/2022 5:17:55 PM By: Worthy Rancher Entered By: Worthy Rancher on 09/13/2022 11:38:48 -------------------------------------------------------------------------------- HPI Details Patient Name: Date of Service: [name]. 09/13/2022 10:45 A M Medical Record Number: [id number] Patient Account Number: [id number] Date of Birth/Sex: Treating RN: [birth year] (72 y.o. M) Primary  Care Provider: [name] Other Clinician: Referring Provider: Treating Provider/Extender: [name] in Treatment: 71 History of Present Illness HPI Description: 10/11/17; Mr. Clowe is a 72 year old man who tells me that in 2015 he slipped down the latter traumatizing his left leg. He developed a wound in the same spot the area that we are currently looking at. He states this closed over for the most part although he always felt it was somewhat unstable. In 2016 he hit the same area with the door of his car had this reopened. He tells me that this is never really closed although sometimes an inflow it remains open on a constant basis. He has not been using any specific dressing to this except for topical antibiotics the nature of which were not really sure. His primary doctor did send him to see Dr. [name] of interventional cardiology. He underwent an angiogram on 08/06/17 and he underwent a PTA and directional atherectomy of the lesser distal SFA and popliteal arteries which resulted in brisk improvement in blood flow. It was noted that he had 2 vessel runoff through the anterior tibial and peroneal. He is also been to see vascular and interventional radiologist.  He was not felt to have any significant superficial venous insufficiency. Presumably is not a candidate for any ablation. It was suggested he come here for wound care. The patient is a type II diabetic on insulin. He also has a history of venous insufficiency. REILLEY, DOLAN ([id number]) [id number] Page 3 of 20 ABIs on the left were noncompressible in our clinic 10/21/17; patient we admitted to the clinic last week. 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 We put Iodosorb on him after an aggressive debridement and 3 layer compression. He complained of pain in his ankle and itching with is skin in fact he scratched the area on the medial calf superiorly at the rim of our wraps and he has 2 small open areas in that location today which are new. I changed his primary dressing today to silver collagen. As noted he is already had revascularization and does not have any significant superficial venous insufficiency that would be amenable to ablation 10/28/17; patient admitted to the clinic 2 weeks ago. He has a smaller Wound. Scratch injury from last week revealed. There is large wound over the tibial area. This is smaller. Granulation looks healthy. No need for debridement. 11/04/17; the wound on the left lateral calf looks better. Improved dimensions. Surface of this looks better. We've been maintaining him and Kerlix Coban wraps. He finds this much more comfortable. Silver collagen dressing 11/11/17; left lateral Wound continues to look healthy be making progress. Using a #5 curet I removed removed nonviable skin from the surface of the wound and then necrotic debris from the wound surface. Surface of the wound continues to look healthy. He also has an open area on the left great toenail bed. We've been using topical antibiotics. 11/19/17; left anterior lateral wound continues to look healthy but it's not  closed. He also had a small wound above this on the left leg Initially traumatic wounds in the setting of significant chronic venous insufficiency and stasis dermatitis 11/25/17; left anterior wounds superiorly is closed still a small wound inferiorly. 12/02/17; left anterior tibial area. Arrives today with adherent callus. Post debridement clearly not completely closed. Hydrofera Blue under 3 layer compression. 12/09/17; left anterior tibia. Circumferential eschar however the wound bed looks  stable to improved. We've been using Hydrofera Blue under 3 layer compression 12/17/17; left anterior tibia. Apparently this was felt to be closed however when the wrap was taken off there is a skin tear to reopen wounds in the same area we've been using Hydrofera Blue under 3 layer compression 12/23/17 left anterior tibia. Not close to close this week apparently the [facility] was stuck to this again. Still circumferential eschar requiring debridement. I put a contact layer on this this time under the Hydrofera Blue 12/31/17; left anterior tibia. Wound is better slight amount of hyper-granulation. Using Hydrofera Blue over Adaptic. 01/07/18; left anterior tibia. The wound had some surface eschar however after this was removed he has no open wound.he was already revascularized by Dr. [name] when he came to our clinic with atherectomy of the left SFA and popliteal artery. He was also sent to interventional radiology for venous reflux studies. He was not felt to have significant reflux but certainly has chronic venous changes of his skin with hemosiderin deposition around this area. He will definitely need to lubricate his skin and wear compression stocking and I've talked to him about this. READMISSION 05/26/2018 This is a now 72 year old man we cared for with traumatic wounds on his left anterior lower extremity. He had been previously revascularized during that admission by Dr. [name]. Apparently in follow-up Dr.  [name] noted that he had deterioration in his arterial status. He underwent a stent placement in the distal left SFA on 04/22/2018. 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 He went back to the angiography suite on 04/30/2018 he underwent PTA and balloon angioplasty of the occluded left mid anterior tibial artery, thrombotic occlusion went from 100 to 0% which reconstitutes the posterior tibial artery. He had thrombectomy and aspiration of the peroneal artery. The stent placed in the distal SFA left SFA was still occluded. 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 He has been recuperating at [place] [facility] in [facility] after the most recent hospitalization. He is going home tomorrow. He tells me that 2 weeks ago he traumatized the tip of his left fifth toe. He came in urgently for our review of this. This was a history of before I noted that Dr. [name] had already noted dry gangrenous changes of the left fifth toe 06/09/2018; 2-week follow-up. I did contact Dr. [name] after his last appointment and he apparently saw 1 of Dr. Irven Shelling colleagues the next day. He does not follow-up with Dr. [name] himself until Thursday of this week. 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 Nevertheless there is no evidence of infection no drainage and no pain. He had a new area that this week when we were signing him in today on the left anterior mid tibia area, this is in close proximity to the previous wound we have dealt with in this clinic. 06/23/2018; 2-week follow-up. I did not receive a recent note from Dr. [name] to review today. Our office is trying to obtain this. He is apparently not planning to do further vascular  interventions and wondered about compression to try and help with the patient's chronic venous insufficiency. However we are also concerned about the arterial flow. He arrives  in clinic today with a new area on the left third toe. The areas on the calf/anterior tibia are close to closing. The left fifth toe is still mummified using Betadine. -In reviewing things with the patient he has what sounds like claudication with mild to moderate amount of activity. 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 I prescribed Levaquin over the phone while we attempted to arrange a plan of care. However the patient called yesterday to report he had low-grade fever and he came in today acutely. There is been a marked deterioration in the left third toe with spreading cellulitis up into the dorsal left foot. He was referred to the emergency room. Readmission: 06/29/2020 patient presents today for reevaluation here in our clinic he was previously treated by Dr. Dellia Nims at the latter part of 2019 in 2 the beginning of 2020. Subsequently we have not seen him since that time in the interim he did have evaluation with vein and vascular specialist specifically Dr. Anice Paganini who did perform quite extensive work for a left femoral to anterior tibial artery bypass. With that being said in the interim the patient has developed significant lymphedema and has wounds that he tells me have really never healed in regard to the incision site on the left leg. He also has multiple wounds on the feet for various reasons some of which is that he tends to pick at his feet. Fortunately there is no signs of active infection systemically at this time he does have some wounds that are little bit deeper but most are fairly superficial he seems to have good blood flow and overall everything appears to be healthy I see no bone exposed and no obvious signs of osteomyelitis. I do not know that he necessarily needs a  x-ray at this point although that something we could consider depending on how things progress. The patient does have a history of lymphedema, diabetes, this is type II, chronic kidney disease stage III, hypertension, and history of peripheral vascular disease. 07/05/2020; patient admitted last week. Is a patient I remember from 2019 he had a spreading infection involving the left foot and we sent him to the hospital. 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 He subsequently had a left femoral to anterior tibial bypass by Dr.Cain vein and vascular. He also has severe lymphedema with chronic skin changes related to that on the left leg. The most problematic area that was new today was on the left medial great toe. This was apparently a small area last week there was purulent drainage which our intake nurse cultured. Also areas on the left medial foot and heel left lateral foot. He has 2 areas on the left medial calf left lateral calf in the setting of the severe lymphedema. 07/13/2020 on evaluation today patient appears to be doing better in my opinion compared to his last visit. The good news is there is no signs of active infection systemically and locally I do not see any signs of infection either. He did have an x-ray which was negative that is great news he had a culture which showed MRSA but at the same time he is been on the doxycycline which has helped. I do think we may want to extend this for 7 additional days 1/25; patient admitted to the clinic a few weeks ago. 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 We  have been putting him under compression his edema control is a lot better but he is severe verricused skin on the left leg. He is really done quite well he still has an open area on the left medial calf and the left medial first metatarsal head. We have been using silver collagen on the leg silver alginate  on the foot 07/27/2020 upon evaluation today patient appears to be doing decently well in regard to his wounds. He still has a lot of dry skin on the left leg. Some of this is starting to peel back and I think he may be able to have them out by removing some that today. Fortunately there is no signs of active infection at this time on the left leg although on the right leg he does appear to have swelling and erythema as well as some mild warmth to touch. This does have been concerned about the possibility of cellulitis although within the differential diagnosis I do think that potentially a DVT has to be at least considered. We need to rule that out before proceeding would just call in the cellulitis. Especially since he is having pain in the posterior aspect of his calf muscle. CEPEDA, FRANQUI ([id number]) [id number] Page 4 of 20 2/8; the patient had seen sparingly. He has severe skin changes of chronic lymphedema in the left leg thickened hyperkeratotic verrucous skin. He has an open wound on the medial part of the left first met head left mid tibia. He also has a rim of nonepithelialized skin in the anterior mid tibia. He brought in the AmLactin lotion that was been prescribed although I am not sure under compression and its utility. There concern about cellulitis on the right lower leg the last time he was here. He was put on on antibiotics. His DVT rule out was negative. The right leg looks fine he is using his stocking on this area 08/10/2020 upon evaluation today patient appears to be doing well with regard to his leg currently. He has been tolerating the dressing changes without complication. Fortunately there is no signs of active infection which is great news. Overall very pleased with where things stand. 2/22; the patient still has an area on the medial part of the left first met his head. This looks better than when I last saw this earlier this month he has a rim  of epithelialization but still some surface debris. Mostly everything on the left leg is healed. There is still a vulnerable in the left mid tibia area. 08/30/2020 upon evaluation today patient appears to be doing much better in regard to his wounds on his foot. 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 Nonetheless I think we will address that today I Minna send in a prescription for him in that regard. Overall though there does not appear to be any signs of significant worsening. 09/07/2020 on evaluation today patient's wounds over his left foot appear to be doing excellent. I do not see any signs of infection there is some callus buildup this can require debridement for certain but overall I feel like he is managing quite nicely. He still using the AmLactin cream which has been beneficial for him as well. 3/22; left foot wound is closed. There is no open area here. He is using ammonium lactate lotion to the lower extremities to help exfoliate dry cracked  skin. He has compression stockings from [facility] in [place]. The wound on the medial part of his left first met head is healed today. READMISSION 04/12/2021 Mr. Bookbinder is a patient we know fairly well he had a prolonged stay in clinic in 2019 with wounds on his left lateral and left anterior lower extremity in the setting of chronic venous insufficiency. More recently he was here earlier this year with predominantly an area on his left foot first metatarsal head plantar and he says the plantar foot broke down on its not long after we discharged him but he did not come back here. The last few months areas of broken down on his left anterior and again the left lateral lower extremity. The leg itself is very swollen chronically enlarged a lot of hyperkeratotic dry Berry Q skin in the left lower leg. His edema  extends well into the thigh. He was seen by Dr. Randie Heinzain. He had ABIs on 03/02/2021 showing an ABI on the right of 1 with a TBI of 0.72 his ABI in the left at 1.09 TBI of 0.99. Monophasic and biphasic waveforms on the right. On the left monophasic waveforms were noted he went on to have an angiogram on 03/27/2021 this showed the aortic aortic and iliac segments were free of flow-limiting stenosis the left common femoral vein to evaluate the left femoral to anterior tibial artery bypass was unobstructed the bypass was patent without any areas of stenosis. We discharged the patient in bilateral juxta lite stockings but very clearly that was not sufficient to control the swelling and maintain skin integrity. He is clearly going to need compression pumps. 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 This is fortunate because he is on his feet for long periods of time. 10/27; patient comes in with our intake nurse reporting copious amount of green drainage from the left anterior mid tibia the left dorsal foot and to a lesser extent the left medial mid tibia. We left the compression wrap on all week for the amount of edema in his left leg is quite a bit better. We use silver alginate as the primary dressing 11/3; edema control is good. Left anterior lower leg left medial lower leg and the plantar first metatarsal head. The left anterior lower leg required debridement. Deep tissue culture I did of this wound showed MRSA I put him on 10 days of doxycycline which she will start today. We have him in compression wraps. He has a security card and [employer] however he is retiring on November 15. We will need to then get him into a better offloading boot for the left foot perhaps a total contact cast 11/10; edema control is quite good. Left anterior and left medial lower leg wounds in the setting of chronic venous insufficiency and lymphedema. He also has a substantial area  over the left plantar first metatarsal head. 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 He has significant hypergranulation on the left plantar foot wound. The patient is a diabetic but he does not have significant PAD 11/17; edema control is quite good. Left anterior and left medial lower leg wounds look better. The really concerning area remains the area on the left plantar first metatarsal head. He has a rim of epithelialization. He has been using a surgical shoe The patient is  now retired from a a [employer] I have gone over with him the need to offload this area aggressively. Starting today with a forefoot off loader but . possibly a total contact cast. 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 Arrives with a very significant odor from I think all of the wounds on the left leg and the left foot. Because of this I did not put a total contact cast on him today but will could still consider this. His wife was having cataract surgery which is the reason he missed the appointment 12/6. I saw this man 5 days ago with a swelling below the popliteal fossa. I thought he actually might have a Baker's cyst however the DVT rule out study that we could arrange right away was negative the technician told me this was not a ruptured Baker's cyst. We attempted to get this aspirated by under ultrasound guidance in interventional radiology however all they did was an ultrasound however it shows an extensive fluid collection 62 x 8 x 9.4 in the left thigh and left calf. The patient states he thinks this started 8 days ago or so but he really is not complaining of any pain, fever or systemic symptoms. He has not ha 12/20; after some difficulty I managed to get the patient into see Dr. Randie Heinzain. Eventually he was  taken into the hospital and had a drain put in the fluid collection below his left knee posteriorly extending into the posterior thigh. He still has the drain in place. Culture of this showed moderate staff aureus few Morganella and few Klebsiella he is now on doxycycline and ciprofloxacin as suggested by infectious disease he is on this for a month. The drain will remain in place until it stops draining 12/29; he comes in today with the 1 wound on his left leg and the area on the left plantar first met head significantly smaller. Both look healthy. He still has the drain in the left leg. He says he has to change this daily. Follows up with Dr. Randie Heinzain on January 11. 06/29/2021; the wounds that I am following on the left leg and left first met head continued to be quite healthy. However the area where his inferior drain is in place had copious amounts of drainage which was green in color. The wound here is larger. Follows up with Dr. Pascal LuxKane of vein and vascular his surgeon next week as well as infectious disease. He remains on ciprofloxacin and doxycycline. He is not complaining of excessive pain in either one of the drain areas 1/12; the patient saw vascular surgery and infectious disease. Vascular surgery has left the drain in place as there was still some notable drainage still see him back in 2 weeks. Dr. Dorthula PerfectKanwar stop the doxycycline and ciprofloxacin and I do not believe he follows up with them at this point. Culture I did last week showed both doxycycline resistant MRSA and Pseudomonas not sensitive to ciprofloxacin although only in rare titers 1/19; the patient's wound on the left anterior lower leg is just about healed. We have continued healing of the area that was medially on the left leg. Left first plantar metatarsal head continues to get smaller. The major problem here is his 2 drain sites 1 on the left upper calf and lateral thigh. There is purulent drainage still from the left lateral thigh.  I  gave him antibiotics last week but we still have recultured. He has the drain in the area I think this is eventually going to have to come out. I suspect there will be a connecting wound to heal here perhaps with improved HERSCHEL, LUKEHART ([id number]) [id number] Page 5 of 20 1/26; the patient had his drain removed by vein and vascular on 1/25/. This was a large pocket of fluid in his left thigh that seem to tunnel into his left upper calf. 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 His mention his Penrose drain was removed today. He now has a tunneling wound on his left calf and left thigh. Both of these probe widely towards each other although I cannot really prove that they connect. Both wounds on his lower leg anteriorly are closed and his area over the first metatarsal head on his right foot continues to improve. We are using Hydrofera Blue here. He also saw infectious disease culture of the abscess they noted was polymicrobial with MRSA, Morganella and Klebsiella he was treated with doxycycline and ciprofloxacin for 4 weeks ending on 07/03/2021. They did not recommend any further antibiotics. Notable that while he still had the Penrose drain in place last week he had purulent drainage coming out of the inferior IandD site this grew [facility], MRSA and Pseudomonas but there does not appear to be any active infection in this area today with the drain out and he is not systemically unwell 2/2; with regards to the drain sites the superior one on the thigh actually is closed down the one on the upper left lateral calf measures about 8 and half centimeters which is an improvement seems to be less prominent although still with a lot of drainage. The only remaining wound is over the first metatarsal head on the left foot and this looks to be continuing to improve with Hydrofera Blue. 2/9; the area on his plantar left foot continues to contract. Callus  around the wound edge. The drain sites specifically have not come down in depth. We put the wound VAC on Monday he changed the canister late last night our intake nurse reported a pocket of fluid perhaps caused by our compression wraps 2/16; continued improvement in left foot plantar wound. drainage site in the calf is not improved in terms of depth (wound vac) 2/23; continued improvement in the left foot wound over the first metatarsal head. With regards to the drain sites the area on his thigh laterally is healed however the open area on his calf is small in terms of circumference by still probes in by about 15 cm. Within using the wound VAC. Hydrofera Blue on his foot 08/24/2021: The left first metatarsal head wound continues to improve. The wound bed is healthy with just some surrounding callus. Unfortunately the open drain site on his calf remains open and tunnels at least 15 cm (the extent of a Q-tip). This is despite several weeks of wound VAC treatment. Based on reading back through the notes, there has been really no significant change in the depth of the wound, although the orifice is smaller and the more cranial wound on his thigh has closed. I suspect the tunnel tracks nearly all the way to this location. 08/31/2021: Continued improvement in the left first metatarsal head wound. There has been absolutely no improvement to the long tunnel from his open drain site on his calf. We have tried to get him into see vascular surgery  sooner to consider the possibility of simply filleting the tract open and allowing it to heal from the bottom up, likely with a wound VAC. They have not yet scheduled a sooner appointment than his current mid April 09/14/2021: He was seen by vascular surgery and they took him to the operating room last week. They opened a portion of the tunnel, but did not extend the entire length of the known open subcutaneous tract. I read Dr. Claretha Cooper operative note and it is not clear  from that documentation why only a portion of the tract was opened. The heaped up granulation tissue was curetted and removed from at least some portion of the tract. They did place a wound VAC and applied an Unna boot to the leg. The ulcer on his left first metatarsal head is smaller today. The bed looks good and there is just a small amount of surrounding callus. 09/21/2021: The ulcer on his left first metatarsal head looks to be stalled. There is some callus surrounding the wound but the wound bed itself does not appear particularly dynamic. The tunnel tract on his lateral left leg seems to be roughly the same length or perhaps slightly smaller but the wound bed appears healthy with good granulation tissue. He opened up a new wound on his medial thigh and the site of a prior surgical incision. He says that he did this unconsciously in his sleep by scratching. 09/28/2021: Unfortunately, the ulcer on his left first metatarsal head has extended underneath the callus toward the dorsum of his foot. The medial thigh wounds are roughly the same. The tunnel on his lateral left leg continues to be problematic; it is longer than we are able to actually probe with a Q-tip. I am still not certain as to why Dr. Donzetta Matters did not open this up entirely when he took the patient to the operating room. We will likely be back in the same situation with just a small superficial opening in a long unhealed tract, as the open portion is granulating in nicely. 10/02/2021: The patient was initially scheduled for a nurse visit, but we are also applying a total contact cast today. The plantar foot wound looks clean without significant accumulated callus. We have been applying Prisma silver collagen to the site. 10/05/2021: The patient is here for his first total contact cast change. We have tried using gauze packing strips in the tunnel on his lateral leg wound, but this does not seem to be working any better than the white VAC foam.  The foot ulcer looks about the same with minimal periwound callus. Medial thigh wound is clean with just some overlying eschar. 10/12/2021: The plantar foot wound is stable without any significant accumulation of periwound callus. The surface is viable with good granulation tissue. The medial thigh wounds are much smaller and are epithelializing. On the other hand, he had purulent drainage coming from the tunnel on his lateral leg. He does go back to see Dr. Donzetta Matters next week and is planning to ask him why the wound tunnel was not completely opened at the time of his most recent operation. 10/19/2021: The plantar foot wound is markedly improved and has epithelial tissue coming through the surface. The medial thigh wounds are nearly closed with just a tiny open area. He did see Dr. Donzetta Matters earlier this week and apparently they did discuss the possibility of opening the sinus tract further and enabling a wound VAC application. Apparently there are some limits as to what Dr. Donzetta Matters  feels comfortable opening, presumably in relationship to his bypass graft. I think if we could get the tract open to the level of the popliteal fossa, this would greatly aid in her ability to get this chart closed. That being said, however, today when I probed the tract with a Q-tip, I was not able to insert the entirety of the Q-tip as I have on previous occasions. The tunnel is shorter by about 4 cm. The surface is clean with good granulation tissue and no further episodes of purulent drainage. 10/30/2021: Last week, the patient underwent surgery and had the long tract in his leg opened. There was a rind that was debrided, according to the operative report. His medial thigh ulcers are closed. The plantar foot wound is clean with a good surface and some built up surrounding callus. 11/06/2021: The overall dimensions of the large wound on his lateral leg remain about the same, but there is good granulation tissue present and the tunneling  is a little bit shorter. He has a new wound on his anterior tibial surface, in the same location where he had a similar lesion in the past. The plantar foot wound is clean with some buildup surrounding callus. Just toward the medial aspect of his foot, however, there is an area of darkening that once debrided, revealed another opening in the skin surface. 11/13/2021: The anterior tibial surface wound is closed. The plantar foot wound has some surrounding callus buildup. The area of darkening that I debrided last week and revealed an opening in the skin surface has closed again. The tunnel in the large wound on his lateral leg has come in by about 3 cm. There is healthy granulation tissue on the entire wound surface. 11/23/2021: The patient was out of town last week and did wet-to-dry dressings on his large wound. 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 Unfortunately, he picked open the wound on his left medial thigh. He says that it was itching and he just could not stop scratching it until it was open again. The wound on his plantar foot is smaller and has not accumulated a tremendous amount of callus. The lateral leg wound is shallower and the tunnel has also decreased in depth. There is just a little bit of slough accumulation on the surface. 11/30/2021: Another portion of his left medial thigh has been opened up. All of these wounds are fairly superficial with just a little bit of slough and eschar accumulation. The wound on his plantar foot is almost closed with just a bit of eschar and periwound callus accumulation. The lateral leg wound is nearly flush with the surrounding skin and the tunnel is markedly shallower. 12/07/2021: There is just 1 open area on his left medial thigh. It is clean with just a little bit of perimeter eschar. The wound on his plantar foot continues to contract and just has some eschar and periwound callus  accumulation. The lateral leg wound is closing at the more distal aspect and the tunnel is smaller. The surface is nearly flush with the surrounding skin and it has a good bed of granulation tissue. 12/14/2021: The thigh and foot wounds are closed. The lateral leg wound has closed over approximately half of its length. The tunnel continues to contract and the surface is now flush with the surrounding skin. The wound bed has robust granulation tissue. CAETANO, HERSHNER ([id number]) [id number] Page 6 of 20 12/22/2021: The thigh  and foot wounds have reopened. The foot wound has a lot of callus accumulation around and over it. The thigh wound is tiny with just a little bit of slough in the wound bed. The lateral leg wound continues to contract. His vascular surgeon took the wound VAC off earlier in the week and the patient has been doing wet-to-dry dressings. There is a little slough accumulation on the surface. The tunnel is about 3 cm in depth at this point. 12/28/2021: The thigh wound is closed again. The foot wound has some callus that subsequently has peeled back exposing just a small slit of a wound. The lateral leg wound Is down to about half the size that it originally was and the tunnel is down to about half a centimeter in depth. 01/04/2022: The thigh wound remains closed. The foot wound has heavy callus overlying the wound site. Once this was debrided, the wound was found to be closed. The lateral leg wound is smaller again this week and very superficial. No tunnel could be identified. 01/12/2022: The thigh and foot wounds both remain closed. The lateral leg wound is now nearly flush with the skin surface. There is good granulation tissue present with a light layer of slough. 01/19/2022: Due to the way his wrap was placed, the patient did not change the dressing on his thigh at all and so the foam was saturated and his skin is macerated. There is a light layer of slough on the  wound surface. The underlying granulation tissue is robust and healthy-appearing. He has heavy callus buildup at the site of his first metatarsal head wound which is still healed. 02/01/2022: He has been in silver alginate. When he removed the dressing from his thigh wound, however, some leg, superficially reopening a portion of the wound that had healed. In addition, underneath the callus at his left first metatarsal head, there appears to be a blister and the wound appears to be open again. 02/08/2022: The lateral leg wound has contracted substantially. There is eschar and a light layer of slough present. He says that it is starting to pull and is uncomfortable. On inspection, there is some puckering of the scar and the eschar is quite dry; this may account for his symptoms. On his first metatarsal head, the wound is much smaller with just some eschar on the surface. The callus has not reaccumulated. He reports that he had a blister come up on his medial thigh wound at the distal aspect. It popped and there is now an opening in his skin again. Looking back through his Napoleon of wound photos, there is what looks like a permanent suture just deep to this location and it may be trying to erode through. We have been using silver alginate on his wounds. 02/15/2022: The lateral leg wound is about half the size it was last week. It is clean with just a little perimeter eschar and light slough. The wound on his first metatarsal head is about the same with heavy callus overlying it. The medial thigh wound is closed again. He does have some skin changes on the top of his foot that looks potentially yeast related. 02/22/2022: The skin on the top of his foot improved with the use of a topical antifungal. The lateral leg wound continues to contract and is again smaller this week. There is a little bit of slough and eschar on the surface. The first metatarsal head wound is a little bit smaller but has reaccumulated a  thick callus  over the top. He decided to try to trim his toenail and ultimately took the entire nail off of his left great toe. 03/02/2022: His lateral leg wound continues to improve, as does the wound on his left great toe. Unfortunately, it appears that somehow his foot got wet and moisture seeped in through the opening causing his skin to lift. There is a large wound now overlying his first metatarsal on both the plantar, medial, and dorsal portion of his foot. There is necrotic tissue and slough present underneath the shaggy macerated skin. 03/08/2022: The lateral leg wound is smaller again today. There is just a light layer of slough and eschar on the surface. The great toe wound is smaller again today. The first metatarsal wound is a little bit smaller today and does not look nearly as necrotic and macerated. There is still slough and nonviable tissue present. 03/15/2022: The lateral leg wound is narrower and just has a little bit of light slough buildup. The first metatarsal wound still has a fair amount of moisture affecting the periwound skin. The great toe wound is healed. 03/22/2022: The lateral leg wound is now isolated to just at the level of his knee. There is some eschar and slough accumulation. The first metatarsal head wound has epithelialized tremendously and is about half the size that it was last week. He still has some maceration on the top of his foot and a fungal odor is present. 03/29/2022: T oday the patient's foot was macerated, suggesting that the cast got wet. The patient has also been picking at his dry skin and has enlarged the wound on his left lateral leg. In the time between having his cast removed and my evaluation, he had picked more dry skin and opened up additional wounds on his Achilles area and dorsal foot. The plantar first metatarsal head wound, however, is smaller and clean with just macerated callus around the perimeter and light slough on the surface. The lateral  leg wound measured a little bit larger but is also fairly clean with eschar and minimal slough. 04/02/2022: The patient had vascular studies done last Friday and so his cast was not applied. He is here today to have that done. 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 04/05/2022: Both wounds are smaller and quite clean. There is just a little biofilm on the lateral leg wound. 10/20; the patient has a wound on the left lateral surgical incision at the level of his lateral knee this looks clean and improved. He is using silver alginate. He also has an area on his left medial foot for which she is using Hydrofera Blue under a total contact cast both wounds are measuring smaller 04/20/2022: The plantar foot wound has contracted considerably and is very close to closing. The lateral leg wound was measured a little larger, but there was a tiny open area that was included in the measurements that was not included last week. He has some eschar around the perimeter but otherwise the wound looks clean. 04/27/2022: The lateral leg wound looks better this week. He says that midweek, he felt it was very dry and began applying hydrogel to the site. I think this was beneficial. The foot wound is nearly closed underneath a thick layer of dry skin and callus. 05/04/2022: The foot wound is healed. He has developed a new small ulcer on his anterior tibial surface about midway up his leg. It has a little slough on the surface. The lateral leg wound still  is fairly dry, but clean with just a little biofilm on the surface. 05/11/2022: The wound on his foot reopened on Wednesday. A large blister formed which then broke open revealing the fat layer underneath. The ulcer on his anterior tibial surface is a little bit larger this week. The lateral leg wound has much better moisture balance this week. Fortunately, prior to his foot wound reopening, he did get the cast made for his orthotic. 05/15/2022: Already, the  left medial foot wound has improved. The tissue is less macerated and the surface is clean. The ulcer on his anterior tibial surface continues to enlarge. This seems likely secondary to accumulated moisture. The lateral leg wound continues to have an improved moisture balance with the use of collagen. 05/25/2022: The medial foot wound continues to contract. It is now substantially smaller with just a little slough on the surface. The anterior tibial surface wound continues to enlarge further. Once again, this seems to be secondary to moisture. The lateral leg wound does not seem to be changing much in size, but the moisture balance is better. 06/01/2022: The anterior tibial wound is closed. The medial foot wound is down to just a very small, couple of millimeters, opening. The lateral leg wound has good moisture balance, but remains unchanged in size. 12/15; the patient's anterior tibial wound has reopened, however the area on his right first metatarsal head is closed. The major wound is actually on the superior part of his surgical wound in the left lateral thigh. Not a completely viable surface under illumination. This may at some point require a debridement I think he is currently using Prisma. As noted the left medial foot wound has closed DAYMEN, KOSAKOWSKI ([id number]) [phone].pdf Page 7 of 20 06/14/2022: The anterior tibial wound has closed. The lateral leg wound has a better surface but is basically unchanged in size. The left medial foot wound has reopened. It looks as though there was some callus accumulation and moisture got under the callus which caused the tissue to break down again. 06/21/2022: A new wound has opened up just distal to the previous anterior tibial wound. It is small but has hypertrophic granulation tissue present. The lateral leg wound is a little bit narrower and has a layer of slough on the surface. The left medial foot wound is down to just a  pinhole. His custom orthotics should be available next week. 06/28/2022: The wound on his first metatarsal head has healed. He has developed a new small wound on his medial lower leg, in an old scar site. The lateral leg wound continues to contract but continues to accumulate slough, as well. 07/03/2022: Despite wearing his custom orthopedic shoes, he managed to reopen the wound on his first metatarsal head. He says he thinks his foot got wet and then some skin lifted up and he peeled this away. Both of the lower leg wounds are smaller and have some dry eschar on the surface. The lateral leg wound is quite a bit narrower today. 07/12/2022: The medial lower leg wound is closed. The anterior lower leg wound has contracted considerably. The lateral upper leg wound is narrower with a layer of slough on the surface. The first metatarsal head wound is also smaller, but had copious drainage which saturated the foam border dressing and resulted in some periwound tissue maceration. Fortunately there was no breakdown at this site. 07/19/2022: The lower leg shows signs of significant maceration; I think he must be sweating excessively inside his cast. There  are several areas of skin breakdown present. The wound on his foot is smaller and that on his lateral leg is narrower and is shorter by about a centimeter. 07/26/2022: Last week we used a zinc Coflex wrap prior to applying his total contact cast and this has had the effect of keeping his skin from getting macerated this week. The anterior leg wound has epithelialized substantially. The lateral leg wound is significantly smaller with just a bit of slough on the surface. The first metatarsal head wound is also smaller this week. 08/02/2022: The anterior leg wound was closed on arrival, but while he was sitting in the room, he picked it open again. The lateral leg wound is smaller with just a little slough on the surface and the first metatarsal head wound has contracted  further, as well. 08/09/2022: The first metatarsal head wound is covered with callus. Underneath the callus, it is nearly completely closed. The lateral leg wound is smaller again this week. The anterior leg wound looks better, but he has such heavy buildup of old skin, that moisture is getting underneath this, becoming trapped, and causing the underlying skin to get macerated and open up. 08/16/2022: The first metatarsal head wound is closed. The lateral leg wound continues to contract and is quite a bit smaller again this week. There is just a small, superficial opening remaining on his anterior tibial surface. 08/23/2022: The first metatarsal head wound has, by some miracle, remained closed. The lateral leg wound is substantially smaller with multiple areas of epithelialization. The anterior tibial surface wound is also quite a bit smaller and very clean. 08/30/2022: Unfortunately, his first metatarsal head wound opened up again. It happened in the same fashion as it has on prior occasions. Moisture got under dried skin/callus and created a wound when he removed his sock, taking the skin with it. The anterior tibial surface has a thick shell of hyperkeratotic skin. This has been contributing to ongoing repeat wounding events as moisture gets underneath this and causes tissue breakdown. 3/15; patient presents for follow-up. His anterior left leg wound has healed. He still has the wound to the left lateral aspect and left first met head. We have been using silver alginate and endoform to these areas under [employer]. He has no issues or complaints today. He has been taking Augmentin and reports improvement to his symptoms to the left first met head. 09/13/2022: He has accumulated more thick dry skin in sheets on his lower leg. The lateral leg wound is about the same size and the left first metatarsal head wound is a little bit smaller. There is slough on both surfaces. There is callus buildup around the  foot wound. Electronic Signature(s) Signed: 09/13/2022 12:03:14 PM By: Fredirick Maudlin MD FACS Entered By: Fredirick Maudlin on 09/13/2022 12:03:14 -------------------------------------------------------------------------------- Paring/cutting of benign hyperkeratotic lesion 2-4 Details Patient Name: Date of Service: JAYZION, JANKIEWICZ 09/13/2022 10:45 A M Medical Record Number: [id number] Patient Account Number: [id number] Date of Birth/Sex: Treating RN: 1951-01-27 (72 y.o. M) Primary Care Provider: [name] Other Clinician: Referring Provider: Treating Provider/Extender: [name] in Treatment: 74 Procedure Performed for: Non-Wound Location Performed By: Physician Fredirick Maudlin, MD Post Procedure Diagnosis Same as Pre-procedure Electronic Signature(s) Signed: 09/13/2022 11:59:48 AM By: Fredirick Maudlin MD FACS Entered By: Fredirick Maudlin on 09/13/2022 11:59:48 -------------------------------------------------------------------------------- Physical Exam Details Patient Name: Date of Service: [name] 09/13/2022 10:45 A Kathrin Penner ([id number]) [id number] Page 8 of 20 Medical Record  Number: [id number] Patient Account Number: [id number] Date of Birth/Sex: Treating RN: [birth year] (72 y.o. M) Primary Care Provider: [name] Other Clinician: Referring Provider: Treating Provider/Extender: [name] in Treatment: 56 Constitutional Slightly hypertensive. . . . no acute distress. Respiratory Normal work of breathing on room air. Notes 09/13/2022: He has accumulated more thick dry skin in sheets on his lower leg. The lateral leg wound is about the same size and the left first metatarsal head wound is a little bit smaller. There is slough on both surfaces. There is callus buildup around the foot wound. Electronic Signature(s) Signed: 09/13/2022 12:07:54 PM By: Fredirick Maudlin MD  FACS Entered By: Fredirick Maudlin on 09/13/2022 12:07:54 -------------------------------------------------------------------------------- Physician Orders Details Patient Name: Date of Service: [name]. 09/13/2022 10:45 A M Medical Record Number: [id number] Patient Account Number: [id number] Date of Birth/Sex: Treating RN: 1951-03-06 (72 y.o. [name] Primary Care Provider: [name] Other Clinician: Referring Provider: Treating Provider/Extender: [name] in Treatment: 31 Verbal / Phone Orders: No Diagnosis Coding ICD-10 Coding Code Description I87.322 Chronic venous hypertension (idiopathic) with inflammation of left lower extremity L97.828 Non-pressure chronic ulcer of other part of left lower leg with other specified severity L97.528 Non-pressure chronic ulcer of other part of left foot with other specified severity E11.51 Type 2 diabetes mellitus with diabetic peripheral angiopathy without gangrene I89.0 Lymphedema, not elsewhere classified L85.9 Epidermal thickening, unspecified Follow-up Appointments ppointment in 1 week. - Dr Celine Ahr - Room 2 Return A Anesthetic Wound #22 Left,Proximal,Lateral Lower Leg (In clinic) Topical Lidocaine 4% applied to wound bed Bathing/ Shower/ Hygiene May shower with protection but do not get wound dressing(s) wet. Protect dressing(s) with water repellant cover (for example, large plastic bag) or a cast cover and may then take shower. Edema Control - Lymphedema / SCD / Other Avoid standing for long periods of time. Patient to wear own compression stockings every day. - on right leg; Moisturize legs daily. Compression stocking or Garment 20-30 mm/Hg pressure to: - left leg daily Off-Loading Wound #18R Left,Plantar Metatarsal head first Open toe surgical shoe to: - Front off loader Left ft Other: - minimal weight bearing left foot Wound Treatment Wound #18R - Metatarsal head first Wound  Laterality: Plantar, Left Cleanser: Soap and Water 1 x Per Week/30 Days Discharge Instructions: May shower and wash wound with dial antibacterial soap and water prior to dressing change. Cleanser: Wound Cleanser 1 x Per Week/30 Days KAILYN, DUBIE ([id number]) [id number] Page 9 of 20 Discharge Instructions: Cleanse the wound with wound cleanser prior to applying a clean dressing using gauze sponges, not tissue or cotton balls. Prim Dressing: Sorbalgon AG Dressing 2x2 (in/in) 1 x Per Week/30 Days ary Discharge Instructions: Apply to wound bed as instructed Secondary Dressing: Optifoam Non-Adhesive Dressing, 4x4 in 1 x Per Week/30 Days Discharge Instructions: Apply over primary dressing as directed. Secondary Dressing: Zetuvit Plus 4x8 in 1 x Per Week/30 Days Discharge Instructions: Apply over primary dressing as directed. Compression Wrap: CoFlex Zinc Unna Boot, 4 x 6 (in/yd) 1 x Per Week/30 Days Discharge Instructions: Apply Coflex Zinc [employer] as directed. Wound #22 - Lower Leg Wound Laterality: Left, Lateral, Proximal Cleanser: Soap and Water 3 x Per Week/30 Days Discharge Instructions: May shower and wash wound with dial antibacterial soap and water prior to dressing change. Cleanser: Wound Cleanser 3 x Per Week/30 Days Discharge Instructions: Cleanse the wound with wound cleanser prior to applying a clean dressing using gauze sponges, not  tissue or cotton balls. Peri-Wound Care: Sween Lotion (Moisturizing lotion) 3 x Per Week/30 Days Discharge Instructions: Apply moisturizing lotion to the leg Topical: Skintegrity Hydrogel 4 (oz) 3 x Per Week/30 Days Discharge Instructions: Apply hydrogel as directed Prim Dressing: Endoform 2x2 in 3 x Per Week/30 Days ary Discharge Instructions: Moisten with Hydrogel or saline Secondary Dressing: ABD Pad, 8x10 (Generic) 3 x Per Week/30 Days Discharge Instructions: Apply over primary dressing as directed. Secured With:  Elastic Bandage 4 inch (ACE bandage) 3 x Per Week/30 Days Discharge Instructions: Secure with ACE bandage as directed. Electronic Signature(s) Signed: 09/13/2022 12:41:48 PM By: Fredirick Maudlin MD FACS Previous Signature: 09/13/2022 11:25:01 AM Version By: Blanche East RN Entered By: Fredirick Maudlin on 09/13/2022 12:08:09 -------------------------------------------------------------------------------- Problem List Details Patient Name: Date of Service: [name]. 09/13/2022 10:45 A M Medical Record Number: [id number] Patient Account Number: [id number] Date of Birth/Sex: Treating RN: 1951/02/27 (72 y.o. [name] Primary Care Provider: [name] Other Clinician: Referring Provider: Treating Provider/Extender: [name] in Treatment: 74 Active Problems ICD-10 Encounter Code Description Active Date MDM Diagnosis I87.322 Chronic venous hypertension (idiopathic) with inflammation of left lower 04/12/2021 No Yes extremity L97.828 Non-pressure chronic ulcer of other part of left lower leg with other specified 04/12/2021 No Yes severity L97.528 Non-pressure chronic ulcer of other part of left foot with other specified 08/26/2022 No Yes severity E11.51 Type 2 diabetes mellitus with diabetic peripheral angiopathy without gangrene 04/12/2021 No Yes XSAVIER, MATUSIK ([id number]) [id number] Page 10 of 20 I89.0 Lymphedema, not elsewhere classified 04/12/2021 No Yes L85.9 Epidermal thickening, unspecified 08/30/2022 No Yes Inactive Problems ICD-10 Code Description Active Date Inactive Date E11.621 Type 2 diabetes mellitus with foot ulcer 04/12/2021 04/12/2021 L97.828 Non-pressure chronic ulcer of other part of left lower leg with other specified severity 06/08/2022 06/08/2022 E11.42 Type 2 diabetes mellitus with diabetic polyneuropathy 04/12/2021 04/12/2021 L02.416 Cutaneous abscess of left lower limb 06/13/2021 06/13/2021 L97.128  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 07/20/2021 07/20/2021 Resolved Problems Electronic Signature(s) Signed: 09/13/2022 11:58:02 AM By: Fredirick Maudlin MD FACS Previous Signature: 09/13/2022 11:26:09 AM Version By: Blanche East RN Entered By: Fredirick Maudlin on 09/13/2022 11:58:02 -------------------------------------------------------------------------------- Progress Note Details Patient Name: Date of Service: [name]. 09/13/2022 10:45 A M Medical Record Number: [id number] Patient Account Number: [id number] Date of Birth/Sex: Treating RN: 16-May-1951 (72 y.o. M) Primary Care Provider: [name] Other Clinician: Referring Provider: Treating Provider/Extender: [name] in Treatment: 7 Subjective Chief Complaint Information obtained from Patient Left leg and foot ulcers 04/12/2021; patient is here for wounds on his left lower leg and left plantar foot over the first metatarsal head History of Present Illness (HPI) 10/11/17; Mr. Heileman is a 72 year old man who tells me that in 2015 he slipped down the latter traumatizing his left leg. He developed a wound in the same spot the area that we are currently looking at. He states this closed over for the most part although he always felt it was somewhat unstable. In 2016 he hit the same area with the door of his car had this reopened. He tells me that this is never really closed although sometimes an inflow it remains open on a constant basis. He has not been using any specific dressing to this except for topical antibiotics the nature of which were not really sure. His primary doctor did send him to see Dr. [name] of interventional cardiology. He underwent an angiogram on 08/06/17 and  he underwent a PTA and directional atherectomy of the lesser distal SFA and popliteal arteries which resulted in brisk improvement in blood flow. It was noted that he had 2 vessel runoff through the  anterior tibial and peroneal. He is also been to see vascular and interventional radiologist. He was not felt to have any significant superficial venous insufficiency. Presumably is not a candidate for any ablation. It was suggested he come here for wound care. The patient is a type II diabetic on insulin. He also has a history of venous insufficiency. ABIs on the left were noncompressible in our clinic 10/21/17; patient we admitted to the clinic last week. 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 We put Iodosorb on him after an aggressive debridement and 3 layer compression. He complained of pain in his ankle and itching with is skin in fact he scratched the area on the medial calf superiorly at the rim of our wraps and he has 2 small open areas in that location today which are new. I changed his primary dressing today to silver collagen. As noted he is already had revascularization and does not have any significant superficial venous insufficiency that would be amenable to ablation NAVI, SUGUITAN ([id number]) [id number] Page 11 of 20 10/28/17; patient admitted to the clinic 2 weeks ago. He has a smaller Wound. Scratch injury from last week revealed. There is large wound over the tibial area. This is smaller. Granulation looks healthy. No need for debridement. 11/04/17; the wound on the left lateral calf looks better. Improved dimensions. Surface of this looks better. We've been maintaining him and Kerlix Coban wraps. He finds this much more comfortable. Silver collagen dressing 11/11/17; left lateral Wound continues to look healthy be making progress. Using a #5 curet I removed removed nonviable skin from the surface of the wound and then necrotic debris from the wound surface. Surface of the wound continues to look healthy. ooHe also has an open area on the left great toenail bed. We've been using topical  antibiotics. 11/19/17; left anterior lateral wound continues to look healthy but it's not closed. ooHe also had a small wound above this on the left leg ooInitially traumatic wounds in the setting of significant chronic venous insufficiency and stasis dermatitis 11/25/17; left anterior wounds superiorly is closed still a small wound inferiorly. 12/02/17; left anterior tibial area. Arrives today with adherent callus. Post debridement clearly not completely closed. Hydrofera Blue under 3 layer compression. 12/09/17; left anterior tibia. Circumferential eschar however the wound bed looks stable to improved. We've been using Hydrofera Blue under 3 layer compression 12/17/17; left anterior tibia. Apparently this was felt to be closed however when the wrap was taken off there is a skin tear to reopen wounds in the same area we've been using Hydrofera Blue under 3 layer compression 12/23/17 left anterior tibia. Not close to close this week apparently the [facility] was stuck to this again. Still circumferential eschar requiring debridement. I put a contact layer on this this time under the Hydrofera Blue 12/31/17; left anterior tibia. Wound is better slight amount of hyper-granulation. Using Hydrofera Blue over Adaptic. 01/07/18; left anterior tibia. The wound had some surface eschar however after this was removed he has no open wound.he was already revascularized by Dr. [name] when he came to our clinic with atherectomy of the left SFA and popliteal artery. He was also sent to interventional radiology for venous reflux studies. He  was not felt to have significant reflux but certainly has chronic venous changes of his skin with hemosiderin deposition around this area. He will definitely need to lubricate his skin and wear compression stocking and I've talked to him about this. READMISSION 05/26/2018 This is a now 72 year old man we cared for with traumatic wounds on his left anterior lower extremity. He had  been previously revascularized during that admission by Dr. [name]. Apparently in follow-up Dr. [name] noted that he had deterioration in his arterial status. He underwent a stent placement in the distal left SFA on 04/22/2018. 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 He went back to the angiography suite on 04/30/2018 he underwent PTA and balloon angioplasty of the occluded left mid anterior tibial artery, thrombotic occlusion went from 100 to 0% which reconstitutes the posterior tibial artery. He had thrombectomy and aspiration of the peroneal artery. The stent placed in the distal SFA left SFA was still occluded. 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 He has been recuperating at [facility] in [facility] after the most recent hospitalization. He is going home tomorrow. He tells me that 2 weeks ago he traumatized the tip of his left fifth toe. He came in urgently for our review of this. This was a history of before I noted that Dr. [name] had already noted dry gangrenous changes of the left fifth toe 06/09/2018; 2-week follow-up. I did contact Dr. [name] after his last appointment and he apparently saw 1 of Dr. Irven Shelling colleagues the next day. He does not follow-up with Dr. [name] himself until Thursday of this week. 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 Nevertheless there is no evidence of infection no drainage and no pain. He had a new area that this week when we were signing him in today on the left anterior mid tibia area, this is in close proximity to the previous wound we have dealt with in this clinic. 06/23/2018; 2-week follow-up. I did not receive a recent note from Dr. [name] to review today. Our  office is trying to obtain this. He is apparently not planning to do further vascular interventions and wondered about compression to try and help with the patient's chronic venous insufficiency. However we are also concerned about the arterial flow. ooHe arrives in clinic today with a new area on the left third toe. The areas on the calf/anterior tibia are close to closing. The left fifth toe is still mummified using Betadine. -In reviewing things with the patient he has what sounds like claudication with mild to moderate amount of activity. 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 I prescribed Levaquin over the phone while we attempted to arrange a plan of care. However the patient called yesterday to report he had low-grade fever and he came in today acutely. There is been a marked deterioration in the left third toe with spreading cellulitis up into the dorsal left foot. He was referred to the emergency room. Readmission: 06/29/2020 patient presents today for reevaluation here in our clinic he was previously treated by Dr. Dellia Nims at the latter part of 2019 in 2 the beginning of 2020. Subsequently we have not seen him  since that time in the interim he did have evaluation with vein and vascular specialist specifically Dr. Anice Paganini who did perform quite extensive work for a left femoral to anterior tibial artery bypass. With that being said in the interim the patient has developed significant lymphedema and has wounds that he tells me have really never healed in regard to the incision site on the left leg. He also has multiple wounds on the feet for various reasons some of which is that he tends to pick at his feet. Fortunately there is no signs of active infection systemically at this time he does have some wounds that are little bit deeper but most are fairly superficial he seems to have good blood flow and overall everything appears to be healthy I see no bone  exposed and no obvious signs of osteomyelitis. I do not know that he necessarily needs a x-ray at this point although that something we could consider depending on how things progress. The patient does have a history of lymphedema, diabetes, this is type II, chronic kidney disease stage III, hypertension, and history of peripheral vascular disease. 07/05/2020; patient admitted last week. Is a patient I remember from 2019 he had a spreading infection involving the left foot and we sent him to the hospital. 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 He subsequently had a left femoral to anterior tibial bypass by Dr.Cain vein and vascular. He also has severe lymphedema with chronic skin changes related to that on the left leg. The most problematic area that was new today was on the left medial great toe. This was apparently a small area last week there was purulent drainage which our intake nurse cultured. Also areas on the left medial foot and heel left lateral foot. He has 2 areas on the left medial calf left lateral calf in the setting of the severe lymphedema. 07/13/2020 on evaluation today patient appears to be doing better in my opinion compared to his last visit. The good news is there is no signs of active infection systemically and locally I do not see any signs of infection either. He did have an x-ray which was negative that is great news he had a culture which showed MRSA but at the same time he is been on the doxycycline which has helped. I do think we may want to extend this for 7 additional days 1/25; patient admitted to the clinic a few weeks ago. 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 We have been putting him under compression his edema control is a lot better but he is severe verricused skin on the left leg. He is really done quite well he still has an open area on the left medial calf and the left  medial first metatarsal head. We have been using silver collagen on the leg silver alginate on the foot 07/27/2020 upon evaluation today patient appears to be doing decently well in regard to his wounds. He still has a lot of dry skin on the left leg. Some of this is starting to peel back and I think he may be able to have them out by removing some that today. Fortunately there is no signs of active infection at this time on the left leg although on the right leg he does appear to have swelling and erythema as well as some mild warmth to touch. This does have been concerned about the  possibility of cellulitis although within the differential diagnosis I do think that potentially a DVT has to be at least considered. We need to rule that out before proceeding would just call in the cellulitis. Especially since he is having pain in the posterior aspect of his calf muscle. 2/8; the patient had seen sparingly. He has severe skin changes of chronic lymphedema in the left leg thickened hyperkeratotic verrucous skin. He has an open wound on the medial part of the left first met head left mid tibia. He also has a rim of nonepithelialized skin in the anterior mid tibia. He brought in the AmLactin lotion that was been prescribed although I am not sure under compression and its utility. There concern about cellulitis on the right lower leg the last time he was here. He was put on on antibiotics. His DVT rule out was negative. The right leg looks fine he is using his stocking on this area 08/10/2020 upon evaluation today patient appears to be doing well with regard to his leg currently. He has been tolerating the dressing changes without KAULIN, PARROTTE ([id number]) [phone].pdf Page 12 of 20 complication. Fortunately there is no signs of active infection which is great news. Overall very pleased with where things stand. 2/22; the patient still has an area on the medial part of the left first  met his head. This looks better than when I last saw this earlier this month he has a rim of epithelialization but still some surface debris. Mostly everything on the left leg is healed. There is still a vulnerable in the left mid tibia area. 08/30/2020 upon evaluation today patient appears to be doing much better in regard to his wounds on his foot. 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 Nonetheless I think we will address that today I Minna send in a prescription for him in that regard. Overall though there does not appear to be any signs of significant worsening. 09/07/2020 on evaluation today patient's wounds over his left foot appear to be doing excellent. I do not see any signs of infection there is some callus buildup this can require debridement for certain but overall I feel like he is managing quite nicely. He still using the AmLactin cream which has been beneficial for him as well. 3/22; left foot wound is closed. There is no open area here. He is using ammonium lactate lotion to the lower extremities to help exfoliate dry cracked skin. He has compression stockings from elastic therapy in [place]. The wound on the medial part of his left first met head is healed today. READMISSION 04/12/2021 Mr. Bochenek is a patient we know fairly well he had a prolonged stay in clinic in 2019 with wounds on his left lateral and left anterior lower extremity in the setting of chronic venous insufficiency. More recently he was here earlier this year with predominantly an area on his left foot first metatarsal head plantar and he says the plantar foot broke down on its not long after we discharged him but he did not come back here. The last few months areas of broken down on his left anterior and again the left lateral lower extremity. The leg itself is very swollen  chronically enlarged a lot of hyperkeratotic dry Berry Q skin in the left lower leg. His edema extends well into the thigh. He was seen  by Dr. Donzetta Matters. He had ABIs on 03/02/2021 showing an ABI on the right of 1 with a TBI of 0.72 his ABI in the left at 1.09 TBI of 0.99. Monophasic and biphasic waveforms on the right. On the left monophasic waveforms were noted he went on to have an angiogram on 03/27/2021 this showed the aortic aortic and iliac segments were free of flow-limiting stenosis the left common femoral vein to evaluate the left femoral to anterior tibial artery bypass was unobstructed the bypass was patent without any areas of stenosis. We discharged the patient in bilateral juxta lite stockings but very clearly that was not sufficient to control the swelling and maintain skin integrity. He is clearly going to need compression pumps. 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 This is fortunate because he is on his feet for long periods of time. 10/27; patient comes in with our intake nurse reporting copious amount of green drainage from the left anterior mid tibia the left dorsal foot and to a lesser extent the left medial mid tibia. We left the compression wrap on all week for the amount of edema in his left leg is quite a bit better. We use silver alginate as the primary dressing 11/3; edema control is good. Left anterior lower leg left medial lower leg and the plantar first metatarsal head. The left anterior lower leg required debridement. Deep tissue culture I did of this wound showed MRSA I put him on 10 days of doxycycline which she will start today. We have him in compression wraps. He has a security card and [employer] however he is retiring on November 15. We will need to then get him into a better offloading boot for the left foot perhaps a total contact cast 11/10; edema control is quite good. Left anterior and left medial lower leg  wounds in the setting of chronic venous insufficiency and lymphedema. He also has a substantial area over the left plantar first metatarsal head. 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 He has significant hypergranulation on the left plantar foot wound. The patient is a diabetic but he does not have significant PAD 11/17; edema control is quite good. Left anterior and left medial lower leg wounds look better. The really concerning area remains the area on the left plantar first metatarsal head. He has a rim of epithelialization. 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 Starting today with a forefoot off loader but . possibly a total contact cast. 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 Arrives with a very significant odor from I think all of the wounds on the left leg and the left foot. Because of this I did not put a total contact cast on him today but will could still consider this. His wife was having cataract surgery which is the reason he missed the appointment 12/6. I saw this man 5 days ago with a swelling below the popliteal fossa. I thought he actually might have a Baker's cyst however the DVT rule out study that we could arrange right away was  negative the technician told me this was not a ruptured Baker's cyst. We attempted to get this aspirated by under ultrasound guidance in interventional radiology however all they did was an ultrasound however it shows an extensive fluid collection 62 x 8 x 9.4 in the left thigh and left calf. The patient states he thinks this started 8 days ago or so but he really is not complaining of any pain, fever or systemic symptoms. He has not  ha 12/20; after some difficulty I managed to get the patient into see Dr. Donzetta Matters. Eventually he was taken into the hospital and had a drain put in the fluid collection below his left knee posteriorly extending into the posterior thigh. He still has the drain in place. Culture of this showed moderate staff aureus few Morganella and few Klebsiella he is now on doxycycline and ciprofloxacin as suggested by infectious disease he is on this for a month. The drain will remain in place until it stops draining 12/29; he comes in today with the 1 wound on his left leg and the area on the left plantar first met head significantly smaller. Both look healthy. He still has the drain in the left leg. He says he has to change this daily. Follows up with Dr. Donzetta Matters on January 11. 06/29/2021; the wounds that I am following on the left leg and left first met head continued to be quite healthy. However the area where his inferior drain is in place had copious amounts of drainage which was green in color. The wound here is larger. Follows up with Dr. Gwenlyn Saran of vein and vascular his surgeon next week as well as infectious disease. He remains on ciprofloxacin and doxycycline. He is not complaining of excessive pain in either one of the drain areas 1/12; the patient saw vascular surgery and infectious disease. Vascular surgery has left the drain in place as there was still some notable drainage still see him back in 2 weeks. Dr. Velna Ochs stop the doxycycline and ciprofloxacin and I do not believe he follows up with them at this point. Culture I did last week showed both doxycycline resistant MRSA and Pseudomonas not sensitive to ciprofloxacin although only in rare titers 1/19; the patient's wound on the left anterior lower leg is just about healed. We have continued healing of the area that was medially on the left leg. Left first plantar metatarsal head continues to get smaller. The major problem here is his 2 drain sites 1 on the  left upper calf and lateral thigh. There is purulent drainage still from the left lateral thigh. I gave him antibiotics last week but we still have recultured. He has the drain in the area I think this is eventually going to have to come out. I suspect there will be a connecting wound to heal here perhaps with improved VAc 1/26; the patient had his drain removed by vein and vascular on 1/25/. This was a large pocket of fluid in his left thigh that seem to tunnel into his left upper calf. 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 His mention his Penrose drain was removed today. He now has a tunneling wound on his left calf and left thigh. Both of these probe widely towards each other although I cannot really prove that they connect. Both wounds on his lower leg anteriorly are closed and his area over the first metatarsal head on his right foot continues to improve. We are using Hydrofera Blue here. He  also saw infectious disease culture of the abscess they noted was polymicrobial with MRSA, Morganella and Klebsiella he was treated with doxycycline and ciprofloxacin for 4 weeks ending on 07/03/2021. They did not recommend any further antibiotics. Notable that while he still had the Penrose drain in place last week he had purulent drainage coming out of the inferior IandD site this grew [facility], MRSA and Pseudomonas but there does not appear to be any RUTGER, FEEST ([id number]) [id number] Page 13 of 20 active infection in this area today with the drain out and he is not systemically unwell 2/2; with regards to the drain sites the superior one on the thigh actually is closed down the one on the upper left lateral calf measures about 8 and half centimeters which is an improvement seems to be less prominent although still with a lot of drainage. The only remaining wound is over the first metatarsal head on the left foot and this looks to be continuing to  improve with Hydrofera Blue. 2/9; the area on his plantar left foot continues to contract. Callus around the wound edge. The drain sites specifically have not come down in depth. We put the wound VAC on Monday he changed the canister late last night our intake nurse reported a pocket of fluid perhaps caused by our compression wraps 2/16; continued improvement in left foot plantar wound. drainage site in the calf is not improved in terms of depth (wound vac) 2/23; continued improvement in the left foot wound over the first metatarsal head. With regards to the drain sites the area on his thigh laterally is healed however the open area on his calf is small in terms of circumference by still probes in by about 15 cm. Within using the wound VAC. Hydrofera Blue on his foot 08/24/2021: The left first metatarsal head wound continues to improve. The wound bed is healthy with just some surrounding callus. Unfortunately the open drain site on his calf remains open and tunnels at least 15 cm (the extent of a Q-tip). This is despite several weeks of wound VAC treatment. Based on reading back through the notes, there has been really no significant change in the depth of the wound, although the orifice is smaller and the more cranial wound on his thigh has closed. I suspect the tunnel tracks nearly all the way to this location. 08/31/2021: Continued improvement in the left first metatarsal head wound. There has been absolutely no improvement to the long tunnel from his open drain site on his calf. We have tried to get him into see vascular surgery sooner to consider the possibility of simply filleting the tract open and allowing it to heal from the bottom up, likely with a wound VAC. They have not yet scheduled a sooner appointment than his current mid April 09/14/2021: He was seen by vascular surgery and they took him to the operating room last week. They opened a portion of the tunnel, but did not extend the entire  length of the known open subcutaneous tract. I read Dr. Claretha Cooper operative note and it is not clear from that documentation why only a portion of the tract was opened. The heaped up granulation tissue was curetted and removed from at least some portion of the tract. They did place a wound VAC and applied an Unna boot to the leg. The ulcer on his left first metatarsal head is smaller today. The bed looks good and there is just a small amount of surrounding callus. 09/21/2021: The  ulcer on his left first metatarsal head looks to be stalled. There is some callus surrounding the wound but the wound bed itself does not appear particularly dynamic. The tunnel tract on his lateral left leg seems to be roughly the same length or perhaps slightly smaller but the wound bed appears healthy with good granulation tissue. He opened up a new wound on his medial thigh and the site of a prior surgical incision. He says that he did this unconsciously in his sleep by scratching. 09/28/2021: Unfortunately, the ulcer on his left first metatarsal head has extended underneath the callus toward the dorsum of his foot. The medial thigh wounds are roughly the same. The tunnel on his lateral left leg continues to be problematic; it is longer than we are able to actually probe with a Q-tip. I am still not certain as to why Dr. Donzetta Matters did not open this up entirely when he took the patient to the operating room. We will likely be back in the same situation with just a small superficial opening in a long unhealed tract, as the open portion is granulating in nicely. 10/02/2021: The patient was initially scheduled for a nurse visit, but we are also applying a total contact cast today. The plantar foot wound looks clean without significant accumulated callus. We have been applying Prisma silver collagen to the site. 10/05/2021: The patient is here for his first total contact cast change. We have tried using gauze packing strips in the tunnel on  his lateral leg wound, but this does not seem to be working any better than the white VAC foam. The foot ulcer looks about the same with minimal periwound callus. Medial thigh wound is clean with just some overlying eschar. 10/12/2021: The plantar foot wound is stable without any significant accumulation of periwound callus. The surface is viable with good granulation tissue. The medial thigh wounds are much smaller and are epithelializing. On the other hand, he had purulent drainage coming from the tunnel on his lateral leg. He does go back to see Dr. Donzetta Matters next week and is planning to ask him why the wound tunnel was not completely opened at the time of his most recent operation. 10/19/2021: The plantar foot wound is markedly improved and has epithelial tissue coming through the surface. The medial thigh wounds are nearly closed with just a tiny open area. He did see Dr. Donzetta Matters earlier this week and apparently they did discuss the possibility of opening the sinus tract further and enabling a wound VAC application. Apparently there are some limits as to what Dr. 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 I think if we could get the tract open to the level of the popliteal fossa, this would greatly aid in her ability to get this chart closed. That being said, however, today when I probed the tract with a Q-tip, I was not able to insert the entirety of the Q-tip as I have on previous occasions. The tunnel is shorter by about 4 cm. The surface is clean with good granulation tissue and no further episodes of purulent drainage. 10/30/2021: Last week, the patient underwent surgery and had the long tract in his leg opened. There was a rind that was debrided, according to the operative report. His medial thigh ulcers are closed. The plantar foot wound is clean with a good surface and some built up surrounding callus. 11/06/2021: The overall dimensions of the large wound on his  lateral leg remain about the  same, but there is good granulation tissue present and the tunneling is a little bit shorter. He has a new wound on his anterior tibial surface, in the same location where he had a similar lesion in the past. The plantar foot wound is clean with some buildup surrounding callus. Just toward the medial aspect of his foot, however, there is an area of darkening that once debrided, revealed another opening in the skin surface. 11/13/2021: The anterior tibial surface wound is closed. The plantar foot wound has some surrounding callus buildup. The area of darkening that I debrided last week and revealed an opening in the skin surface has closed again. The tunnel in the large wound on his lateral leg has come in by about 3 cm. There is healthy granulation tissue on the entire wound surface. 11/23/2021: The patient was out of town last week and did wet-to-dry dressings on his large wound. 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 Unfortunately, he picked open the wound on his left medial thigh. He says that it was itching and he just could not stop scratching it until it was open again. The wound on his plantar foot is smaller and has not accumulated a tremendous amount of callus. The lateral leg wound is shallower and the tunnel has also decreased in depth. There is just a little bit of slough accumulation on the surface. 11/30/2021: Another portion of his left medial thigh has been opened up. All of these wounds are fairly superficial with just a little bit of slough and eschar accumulation. The wound on his plantar foot is almost closed with just a bit of eschar and periwound callus accumulation. The lateral leg wound is nearly flush with the surrounding skin and the tunnel is markedly shallower. 12/07/2021: There is just 1 open area on his left medial thigh. It is clean with just a little bit of perimeter eschar. The  wound on his plantar foot continues to contract and just has some eschar and periwound callus accumulation. The lateral leg wound is closing at the more distal aspect and the tunnel is smaller. The surface is nearly flush with the surrounding skin and it has a good bed of granulation tissue. 12/14/2021: The thigh and foot wounds are closed. The lateral leg wound has closed over approximately half of its length. The tunnel continues to contract and the surface is now flush with the surrounding skin. The wound bed has robust granulation tissue. 12/22/2021: The thigh and foot wounds have reopened. The foot wound has a lot of callus accumulation around and over it. The thigh wound is tiny with just a little bit of slough in the wound bed. The lateral leg wound continues to contract. His vascular surgeon took the wound VAC off earlier in the week and the patient has been doing wet-to-dry dressings. There is a little slough accumulation on the surface. The tunnel is about 3 cm in depth at this point. 12/28/2021: The thigh wound is closed again. The foot wound has some callus that subsequently has peeled back exposing just a small slit of a wound. The lateral leg wound Is down to about half the size that it originally was and the tunnel is down to about half a centimeter in depth. 01/04/2022: The thigh wound remains closed. The foot wound has heavy callus overlying the wound site. Once this was debrided, the wound was found to be DEMARQUIS, BRAGANZA ([id number]) [id number] Page  14 of 20 closed. The lateral leg wound is smaller again this week and very superficial. No tunnel could be identified. 01/12/2022: The thigh and foot wounds both remain closed. The lateral leg wound is now nearly flush with the skin surface. There is good granulation tissue present with a light layer of slough. 01/19/2022: Due to the way his wrap was placed, the patient did not change the dressing on his thigh at all  and so the foam was saturated and his skin is macerated. There is a light layer of slough on the wound surface. The underlying granulation tissue is robust and healthy-appearing. He has heavy callus buildup at the site of his first metatarsal head wound which is still healed. 02/01/2022: He has been in silver alginate. When he removed the dressing from his thigh wound, however, some leg, superficially reopening a portion of the wound that had healed. In addition, underneath the callus at his left first metatarsal head, there appears to be a blister and the wound appears to be open again. 02/08/2022: The lateral leg wound has contracted substantially. There is eschar and a light layer of slough present. He says that it is starting to pull and is uncomfortable. On inspection, there is some puckering of the scar and the eschar is quite dry; this may account for his symptoms. On his first metatarsal head, the wound is much smaller with just some eschar on the surface. The callus has not reaccumulated. He reports that he had a blister come up on his medial thigh wound at the distal aspect. It popped and there is now an opening in his skin again. Looking back through his [facility] of wound photos, there is what looks like a permanent suture just deep to this location and it may be trying to erode through. We have been using silver alginate on his wounds. 02/15/2022: The lateral leg wound is about half the size it was last week. It is clean with just a little perimeter eschar and light slough. The wound on his first metatarsal head is about the same with heavy callus overlying it. The medial thigh wound is closed again. He does have some skin changes on the top of his foot that looks potentially yeast related. 02/22/2022: The skin on the top of his foot improved with the use of a topical antifungal. The lateral leg wound continues to contract and is again smaller this week. There is a little bit of slough and  eschar on the surface. The first metatarsal head wound is a little bit smaller but has reaccumulated a thick callus over the top. He decided to try to trim his toenail and ultimately took the entire nail off of his left great toe. 03/02/2022: His lateral leg wound continues to improve, as does the wound on his left great toe. Unfortunately, it appears that somehow his foot got wet and moisture seeped in through the opening causing his skin to lift. There is a large wound now overlying his first metatarsal on both the plantar, medial, and dorsal portion of his foot. There is necrotic tissue and slough present underneath the shaggy macerated skin. 03/08/2022: The lateral leg wound is smaller again today. There is just a light layer of slough and eschar on the surface. The great toe wound is smaller again today. The first metatarsal wound is a little bit smaller today and does not look nearly as necrotic and macerated. There is still slough and nonviable tissue present. 03/15/2022: The lateral leg wound is  narrower and just has a little bit of light slough buildup. The first metatarsal wound still has a fair amount of moisture affecting the periwound skin. The great toe wound is healed. 03/22/2022: The lateral leg wound is now isolated to just at the level of his knee. There is some eschar and slough accumulation. The first metatarsal head wound has epithelialized tremendously and is about half the size that it was last week. He still has some maceration on the top of his foot and a fungal odor is present. 03/29/2022: T oday the patient's foot was macerated, suggesting that the cast got wet. The patient has also been picking at his dry skin and has enlarged the wound on his left lateral leg. In the time between having his cast removed and my evaluation, he had picked more dry skin and opened up additional wounds on his Achilles area and dorsal foot. The plantar first metatarsal head wound, however, is smaller  and clean with just macerated callus around the perimeter and light slough on the surface. The lateral leg wound measured a little bit larger but is also fairly clean with eschar and minimal slough. 04/02/2022: The patient had vascular studies done last Friday and so his cast was not applied. He is here today to have that done. 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 04/05/2022: Both wounds are smaller and quite clean. There is just a little biofilm on the lateral leg wound. 10/20; the patient has a wound on the left lateral surgical incision at the level of his lateral knee this looks clean and improved. He is using silver alginate. He also has an area on his left medial foot for which she is using Hydrofera Blue under a total contact cast both wounds are measuring smaller 04/20/2022: The plantar foot wound has contracted considerably and is very close to closing. The lateral leg wound was measured a little larger, but there was a tiny open area that was included in the measurements that was not included last week. He has some eschar around the perimeter but otherwise the wound looks clean. 04/27/2022: The lateral leg wound looks better this week. He says that midweek, he felt it was very dry and began applying hydrogel to the site. I think this was beneficial. The foot wound is nearly closed underneath a thick layer of dry skin and callus. 05/04/2022: The foot wound is healed. He has developed a new small ulcer on his anterior tibial surface about midway up his leg. It has a little slough on the surface. The lateral leg wound still is fairly dry, but clean with just a little biofilm on the surface. 05/11/2022: The wound on his foot reopened on Wednesday. A large blister formed which then broke open revealing the fat layer underneath. The ulcer on his anterior tibial surface is a little bit larger this week. The lateral leg wound has much better moisture balance this week. Fortunately,  prior to his foot wound reopening, he did get the cast made for his orthotic. 05/15/2022: Already, the left medial foot wound has improved. The tissue is less macerated and the surface is clean. The ulcer on his anterior tibial surface continues to enlarge. This seems likely secondary to accumulated moisture. The lateral leg wound continues to have an improved moisture balance with the use of collagen. 05/25/2022: The medial foot wound continues to contract. It is now substantially smaller with just a little slough on the surface. The anterior tibial surface wound continues to  enlarge further. Once again, this seems to be secondary to moisture. The lateral leg wound does not seem to be changing much in size, but the moisture balance is better. 06/01/2022: The anterior tibial wound is closed. The medial foot wound is down to just a very small, couple of millimeters, opening. The lateral leg wound has good moisture balance, but remains unchanged in size. 12/15; the patient's anterior tibial wound has reopened, however the area on his right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

## 2022-09-19 ENCOUNTER — Ambulatory Visit (INDEPENDENT_AMBULATORY_CARE_PROVIDER_SITE_OTHER): Payer: Medicare Other | Admitting: Nurse Practitioner

## 2022-09-19 ENCOUNTER — Encounter: Payer: Self-pay | Admitting: Nurse Practitioner

## 2022-09-19 VITALS — BP 130/80 | HR 67 | Ht 73.0 in | Wt 279.2 lb

## 2022-09-19 DIAGNOSIS — E1165 Type 2 diabetes mellitus with hyperglycemia: Secondary | ICD-10-CM

## 2022-09-19 DIAGNOSIS — E782 Mixed hyperlipidemia: Secondary | ICD-10-CM | POA: Diagnosis not present

## 2022-09-19 DIAGNOSIS — Z794 Long term (current) use of insulin: Secondary | ICD-10-CM

## 2022-09-19 DIAGNOSIS — I10 Essential (primary) hypertension: Secondary | ICD-10-CM | POA: Diagnosis not present

## 2022-09-19 LAB — POCT GLYCOSYLATED HEMOGLOBIN (HGB A1C): Hemoglobin A1C: 10.2 % — AB (ref 4.0–5.6)
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
## 2022-09-20 ENCOUNTER — Encounter (HOSPITAL_BASED_OUTPATIENT_CLINIC_OR_DEPARTMENT_OTHER): Payer: Medicare Other | Admitting: General Surgery

## 2022-09-20 DIAGNOSIS — I87322 Chronic venous hypertension (idiopathic) with inflammation of left lower extremity: Secondary | ICD-10-CM | POA: Diagnosis not present

## 2022-09-21 NOTE — Progress Notes (Signed)
JOSTYN, GARVERICK ([id number]) [id number] Page 1 of 8 Visit Report for 09/20/2022 Arrival Information Details Patient Name: Date of Service: [name], [name] 09/20/2022 8:30 A M Medical Record Number: [id number] Patient Account Number: [id number] Date of Birth/Sex: Treating RN: 11/23/1950 (72 y.o. [name] Primary Care Willi Borowiak: [name] Other Clinician: Referring Jobina Maita: Treating Disa Riedlinger/Extender: [name] in Treatment: 66 Visit Information History Since Last Visit Added or deleted any medications: No Patient Arrived: Ambulatory Any new allergies or adverse reactions: No Arrival Time: 08:40 Had a fall or experienced change in No Accompanied By: self activities of daily living that may affect Transfer Assistance: None risk of falls: Patient Identification Verified: Yes Signs or symptoms of abuse/neglect since last visito No Secondary Verification Process Completed: Yes Hospitalized since last visit: No Patient Requires Transmission-Based Precautions: No Implantable device outside of the clinic excluding No Patient Has Alerts: Yes cellular tissue based products placed in the center since last visit: Has Dressing in Place as Prescribed: Yes Has Compression in Place as Prescribed: Yes Pain Present Now: No Electronic Signature(s) Signed: 09/20/2022 4:40:02 PM By: Adline Peals Entered By: Adline Peals on 09/20/2022 08:40:33 -------------------------------------------------------------------------------- Compression Therapy Details Patient Name: Date of Service: [name] Garfinkel. 09/20/2022 8:30 A M Medical Record Number: [id number] Patient Account Number: [id number] Date of Birth/Sex: Treating RN: 08-Nov-1950 (72 y.o. [name] Primary Care Kaylena Pacifico: [name] Other Clinician: Referring Mang Hazelrigg: Treating Oma Marzan/Extender: [name] in Treatment: 57 Compression  Therapy Performed for Wound Assessment: Wound #18R Left,Plantar Metatarsal head first Performed By: Clinician Adline Peals, RN Compression Type: Double Layer Post Procedure Diagnosis Same as Pre-procedure Electronic Signature(s) Signed: 09/20/2022 4:40:02 PM By: Sabas Sous By: Adline Peals on 09/20/2022 09:43:14 -------------------------------------------------------------------------------- Encounter Discharge Information Details Patient Name: Date of Service: [name] Garfinkel. 09/20/2022 8:30 A M Medical Record Number: [id number] Patient Account Number: [id number] Date of Birth/Sex: Treating RN: March 03, 1951 (72 y.o. [name] Primary Care Alanys Godino: [name] Other Clinician: Referring Tinika Bucknam: Treating Shabrea Weldin/Extender: [name] in Treatment: 69 Encounter Discharge Information Items Post Procedure Vitals Discharge Condition: Stable Temperature (F): 98 Ambulatory Status: Ambulatory Pulse (bpm): 62 Discharge Destination: Home Respiratory Rate (breaths/min): 18 Transportation: Private Auto Blood Pressure (mmHg): 144/81 [name], [name] ([id number]) [id number] Page 2 of 8 Accompanied By: self Schedule Follow-up Appointment: Yes Clinical Summary of Care: Patient Declined Electronic Signature(s) Signed: 09/20/2022 4:40:02 PM By: Sabas Sous By: Adline Peals on 09/20/2022 09:43:56 -------------------------------------------------------------------------------- Lower Extremity Assessment Details Patient Name: Date of Service: [name], [name] 09/20/2022 8:30 A M Medical Record Number: [id number] Patient Account Number: [id number] Date of Birth/Sex: Treating RN: 1951-03-21 (72 y.o. [name] Primary Care Nahuel Wilbert: [name] Other Clinician: Referring Natasha Burda: Treating Genasis Zingale/Extender: [name] in Treatment: 75 Edema  Assessment Assessed: [name]: No] [name]: No] Edema: [Left: Ye] [Right: s] Calf Left: Right: Point of Measurement: 41 cm From Medial Instep 44.5 cm Ankle Left: Right: Point of Measurement: 10 cm From Medial Instep 29.5 cm Vascular Assessment Pulses: Dorsalis Pedis Palpable: [Left:Yes] Electronic Signature(s) Signed: 09/20/2022 4:40:02 PM By: Adline Peals Entered By: Adline Peals on 09/20/2022 08:46:30 -------------------------------------------------------------------------------- Multi Wound Chart Details Patient Name: Date of Service: [name] Garfinkel. 09/20/2022 8:30 A M Medical Record Number: [id number] Patient Account Number: [id number] Date of Birth/Sex: Treating RN: 24-Sep-1950 (72 y.o. M) Primary Care Latham Kinzler: [name] Other Clinician: Referring Irineo Gaulin: Treating Haroldine Redler/Extender: [name] in Treatment: [id number]  Vital Signs Height(in): 74 Capillary Blood Glucose(mg/dl): 141 Weight(lbs): 238 Pulse(bpm): 62 Body Mass Index(BMI): 30.6 Blood Pressure(mmHg): 144/81 Temperature(F): 98 Respiratory Rate(breaths/min): 18 [18R:Photos:] [N/A:N/A [phone].pdf Page 3 of 8] Left, Plantar Metatarsal head first Left, Proximal, Lateral Lower Leg N/A Wound Location: Gradually Appeared Bump N/A Wounding Event: Diabetic Wound/Ulcer of the Lower Cyst N/A Primary Etiology: Extremity Glaucoma, Sleep Apnea, Glaucoma, Sleep Apnea, N/A Comorbid History: Hypertension, Peripheral ArterialHypertension, Peripheral Arterial Disease, Peripheral Venous Disease, Disease, Peripheral Venous Disease, Type II Diabetes, Gout, Osteoarthritis, Type II Diabetes, Gout, Osteoarthritis, Neuropathy Neuropathy 08/23/2020 06/03/2021 N/A Date Acquired: 75 67 N/A Weeks of Treatment: Open Open N/A Wound Status: Yes No N/A Wound Recurrence: No Yes N/A Clustered Wound: N/A 3 N/A Clustered Quantity: 1.2x2.5x0.1 5x1x0.1 N/A Measurements L x  W x D (cm) 2.356 3.927 N/A A (cm) : rea 0.236 0.393 N/A Volume (cm) : 83.30% -138.10% N/A % Reduction in A rea: 91.70% 70.20% N/A % Reduction in Volume: Grade 2 Full Thickness With Exposed Support N/A Classification: Structures Medium Medium N/A Exudate A mount: Serosanguineous Serosanguineous N/A Exudate Type: red, brown red, brown N/A Exudate Color: Flat and Intact Fibrotic scar, thickened scar N/A Wound Margin: Large (67-100%) Large (67-100%) N/A Granulation A mount: Red Red, Pink N/A Granulation Quality: Small (1-33%) Small (1-33%) N/A Necrotic A mount: Fat Layer (Subcutaneous Tissue): Yes Fat Layer (Subcutaneous Tissue): Yes N/A Exposed Structures: Fascia: No Fascia: No Tendon: No Tendon: No Muscle: No Muscle: No Joint: No Joint: No Bone: No Bone: No Small (1-33%) Small (1-33%) N/A Epithelialization: Debridement - Selective/Open Wound Debridement - Selective/Open Wound N/A Debridement: Pre-procedure Verification/Time Out 08:54 08:54 N/A Taken: Lidocaine 4% T opical Solution Lidocaine 4% Topical Solution N/A Pain Control: Callus, [facility] N/A Tissue Debrided: Non-Viable Tissue Non-Viable Tissue N/A Level: 3 2 N/A Debridement A (sq cm): rea Curette Curette N/A Instrument: Minimum Minimum N/A Bleeding: Pressure Pressure N/A Hemostasis A chieved: Procedure was tolerated well Procedure was tolerated well N/A Debridement Treatment Response: 1.2x2.5x0.1 5x1x0.1 N/A Post Debridement Measurements L x W x D (cm) 0.236 0.393 N/A Post Debridement Volume: (cm) Callus: Yes Scarring: Yes N/A Periwound Skin Texture: Maceration: No Dry/Scaly: Yes N/A Periwound Skin Moisture: Dry/Scaly: No No Abnormalities Noted Hemosiderin Staining: Yes N/A Periwound Skin Color: No Abnormality No Abnormality N/A Temperature: Debridement Debridement N/A Procedures Performed: Treatment Notes Electronic Signature(s) Signed: 09/20/2022 9:41:14 AM By: Fredirick Maudlin MD FACS Entered By: Fredirick Maudlin on 09/20/2022 09:41:14 -------------------------------------------------------------------------------- Multi-Disciplinary Care Plan Details Patient Name: Date of Service: [name] Garfinkel. 09/20/2022 8:30 A M Medical Record Number: [id number] Patient Account Number: [id number] Date of Birth/Sex: Treating RN: 08/07/1950 (72 y.o. [name] Primary Care Marda Breidenbach: [name] Other Clinician: Referring Bowyn Mercier: Treating Param Capri/Extender: [name] in Treatment: 29 Multidisciplinary Care Plan reviewed with physician [address] [name], [name] ([id number]) [id number] Page 4 of 8 Venous Leg Ulcer Nursing Diagnoses: Knowledge deficit related to disease process and management Potential for venous Insuffiency (use before diagnosis confirmed) Goals: Patient will maintain optimal edema control Date Initiated: 07/27/2021 Target Resolution Date: 02/23/2023 Goal Status: Active Interventions: Assess peripheral edema status every visit. Treatment Activities: Therapeutic compression applied : 07/27/2021 Notes: Wound/Skin Impairment Nursing Diagnoses: Impaired tissue integrity Knowledge deficit related to ulceration/compromised skin integrity Goals: Patient will have a decrease in wound volume by X% from date: (specify in notes) Date Initiated: 04/12/2021 Date Inactivated: 01/04/2022 Target Resolution Date: 04/23/2021 Goal Status: Met Patient/caregiver will verbalize understanding of skin care regimen Date Initiated: 01/04/2022 Target Resolution Date:  02/23/2023 Goal Status: Active Ulcer/skin breakdown will have a volume reduction of 30% by week 4 Date Initiated: 04/12/2021 Date Inactivated: 04/27/2021 Target Resolution Date: 04/27/2021 Goal Status: Unmet Unmet Reason: infection Ulcer/skin breakdown will have a volume reduction of 50% by week 8 Date Initiated: 04/27/2021 Date  Inactivated: 06/29/2021 Target Resolution Date: 06/24/2021 Goal Status: Met Interventions: Assess patient/caregiver ability to obtain necessary supplies Assess patient/caregiver ability to perform ulcer/skin care regimen upon admission and as needed Assess ulceration(s) every visit Notes: Electronic Signature(s) Signed: 09/20/2022 4:40:02 PM By: Adline Peals Entered By: Adline Peals on 09/20/2022 08:56:44 -------------------------------------------------------------------------------- Pain Assessment Details Patient Name: Date of Service: [name] Garfinkel. 09/20/2022 8:30 A M Medical Record Number: [id number] Patient Account Number: [id number] Date of Birth/Sex: Treating RN: 1951-01-06 (72 y.o. [name] Primary Care Aislee Landgren: [name] Other Clinician: Referring Shaterria Sager: Treating Yehudit Fulginiti/Extender: [name] in Treatment: 74 Active Problems Location of Pain Severity and Description of Pain Patient Has Paino No Site Locations Rate the pain. [name], [name] ([id number]) [id number] Page 5 of 8 Rate the pain. Current Pain Level: 0 Pain Management and Medication Current Pain Management: Electronic Signature(s) Signed: 09/20/2022 4:40:02 PM By: Adline Peals Entered By: Adline Peals on 09/20/2022 08:40:13 -------------------------------------------------------------------------------- Patient/Caregiver Education Details Patient Name: Date of Service: [place], [name] 3/28/2024andnbsp8:30 [place] Record Number: [id number] Patient Account Number: [id number] Date of Birth/Gender: Treating RN: July 12, 1950 (72 y.o. [name] Primary Care Physician: [name] Other Clinician: Referring Physician: Treating Physician/Extender: [name] in Treatment: 51 Education Assessment Education Provided To: Patient Education Topics Provided Wound/Skin  Impairment: Methods: Explain/Verbal Responses: Reinforcements needed, State content correctly Electronic Signature(s) Signed: 09/20/2022 4:40:02 PM By: Adline Peals Entered By: Adline Peals on 09/20/2022 08:57:12 -------------------------------------------------------------------------------- Wound Assessment Details Patient Name: Date of Service: [name] Garfinkel. 09/20/2022 8:30 A M Medical Record Number: [id number] Patient Account Number: [id number] Date of Birth/Sex: Treating RN: 07/15/1950 (72 y.o. [name] Primary Care Corsica Franson: [name] Other Clinician: Referring Kairie Vangieson: Treating Stoy Fenn/Extender: [name] in Treatment: 75 Wound Status Wound Number: 18R Primary Diabetic Wound/Ulcer of the Lower Extremity Etiology: Wound Location: Left, Plantar Metatarsal head first Wound Open Wounding Event: Gradually Appeared Status: Date Acquired: 08/23/2020 Comorbid Glaucoma, Sleep Apnea, Hypertension, Peripheral Arterial Disease, Weeks Of Treatment: 75 History: Peripheral Venous Disease, Type II Diabetes, Gout, Osteoarthritis, Clustered Wound: No Neuropathy [name], [name] ([id number]) [id number] Page 6 of 8 Photos Wound Measurements Length: (cm) 1.2 Width: (cm) 2.5 Depth: (cm) 0.1 Area: (cm) 2.356 Volume: (cm) 0.236 % Reduction in Area: 83.3% % Reduction in Volume: 91.7% Epithelialization: Small (1-33%) Tunneling: No Undermining: No Wound Description Classification: Grade 2 Wound Margin: Flat and Intact Exudate Amount: Medium Exudate Type: Serosanguineous Exudate Color: red, brown Foul Odor After Cleansing: No Slough/Fibrino Yes Wound Bed Granulation Amount: Large (67-100%) Exposed Structure Granulation Quality: Red Fascia Exposed: No Necrotic Amount: Small (1-33%) Fat Layer (Subcutaneous Tissue) Exposed: Yes Necrotic Quality: Adherent Slough Tendon Exposed: No Muscle Exposed:  No Joint Exposed: No Bone Exposed: No Periwound Skin Texture Texture Color No Abnormalities Noted: No No Abnormalities Noted: Yes Callus: Yes Temperature / Pain Temperature: No Abnormality Moisture No Abnormalities Noted: No Dry / Scaly: No Maceration: No Treatment Notes Wound #18R (Metatarsal head first) Wound Laterality: Plantar, Left Cleanser Soap and Water Discharge Instruction: May shower and wash wound with dial antibacterial soap and water prior to dressing change. Wound Cleanser Discharge Instruction: Cleanse the wound with wound cleanser prior  to applying a clean dressing using gauze sponges, not tissue or cotton balls. Peri-Wound Care Topical Primary Dressing Sorbalgon AG Dressing 2x2 (in/in) Discharge Instruction: Apply to wound bed as instructed Secondary Dressing Optifoam Non-Adhesive Dressing, 4x4 in Discharge Instruction: Apply over primary dressing as directed. Zetuvit Plus 4x8 in Discharge Instruction: Apply over primary dressing as directed. Secured With [name] ([id number]) [phone].pdf Page 7 of 8 Compression Wrap CoFlex Zinc Unna Boot, 4 x 6 (in/yd) Discharge Instruction: Apply Coflex Zinc [employer] as directed. Compression Stockings Add-Ons Electronic Signature(s) Signed: 09/20/2022 4:40:02 PM By: Adline Peals Entered By: Adline Peals on 09/20/2022 08:48:59 -------------------------------------------------------------------------------- Wound Assessment Details Patient Name: Date of Service: [name] Garfinkel. 09/20/2022 8:30 A M Medical Record Number: [id number] Patient Account Number: [id number] Date of Birth/Sex: Treating RN: [birth year] (72 y.o. [name] Primary Care Beckey Polkowski: [name] Other Clinician: Referring Levetta Bognar: Treating Arpan Eskelson/Extender: [name] in Treatment: 57 Wound Status Wound Number: 22 Primary Cyst Etiology: Wound Location: Left, Proximal,  Lateral Lower Leg Wound Open Wounding Event: Bump Status: Date Acquired: 06/03/2021 Comorbid Glaucoma, Sleep Apnea, Hypertension, Peripheral Arterial Disease, Weeks Of Treatment: 67 History: Peripheral Venous Disease, Type II Diabetes, Gout, Osteoarthritis, Clustered Wound: Yes Neuropathy Photos Wound Measurements Length: (cm) Width: (cm) Depth: (cm) Clustered Quantity: Area: (cm) Volume: (cm) 5 % Reduction in Area: -138.1% 1 % Reduction in Volume: 70.2% 0.1 Epithelialization: Small (1-33%) 3 Tunneling: No 3.927 Undermining: No 0.393 Wound Description Classification: Full Thickness With Exposed Support Structures Wound Margin: Fibrotic scar, thickened scar Exudate Amount: Medium Exudate Type: Serosanguineous Exudate Color: red, brown Foul Odor After Cleansing: No Slough/Fibrino Yes Wound Bed Granulation Amount: Large (67-100%) Exposed Structure Granulation Quality: Red, Pink Fascia Exposed: No Necrotic Amount: Small (1-33%) Fat Layer (Subcutaneous Tissue) Exposed: Yes Necrotic Quality: Adherent Slough Tendon Exposed: No Muscle Exposed: No Joint Exposed: No Bone Exposed: No Periwound Skin Texture Texture Color No Abnormalities Noted: No No Abnormalities Noted: No [name], [name] ([id number]) [id number] Page 8 of 8 Scarring: Yes Hemosiderin Staining: Yes Moisture Temperature / Pain No Abnormalities Noted: No Temperature: No Abnormality Dry / Scaly: Yes Treatment Notes Wound #22 (Lower Leg) Wound Laterality: Left, Lateral, Proximal Cleanser Soap and Water Discharge Instruction: May shower and wash wound with dial antibacterial soap and water prior to dressing change. Wound Cleanser Discharge Instruction: Cleanse the wound with wound cleanser prior to applying a clean dressing using gauze sponges, not tissue or cotton balls. Peri-Wound Care Sween Lotion (Moisturizing lotion) Discharge Instruction: Apply moisturizing lotion to the  leg Topical Skintegrity Hydrogel 4 (oz) Discharge Instruction: Apply hydrogel as directed Primary Dressing Endoform 2x2 in Discharge Instruction: Moisten with Hydrogel or saline Secondary Dressing ABD Pad, 8x10 Discharge Instruction: Apply over primary dressing as directed. Secured With Elastic Bandage 4 inch (ACE bandage) Discharge Instruction: Secure with ACE bandage as directed. Compression Wrap Compression Stockings Add-Ons Electronic Signature(s) Signed: 09/20/2022 4:40:02 PM By: Adline Peals Entered By: Adline Peals on 09/20/2022 08:49:26 -------------------------------------------------------------------------------- Vitals Details Patient Name: Date of Service: [name] Garfinkel. 09/20/2022 8:30 A M Medical Record Number: [id number] Patient Account Number: [id number] Date of Birth/Sex: Treating RN: 01/18/1951 (71 y.o. [name] Primary Care Cassandra Harbold: [name] Other Clinician: Referring Alvie Speltz: Treating Laykin Rainone/Extender: [name] in Treatment: 81 Vital Signs Time Taken: 08:39 Temperature (F): 98 Height (in): 74 Pulse (bpm): 62 Weight (lbs): 238 Respiratory Rate (breaths/min): 18 Body Mass Index (BMI): 30.6 Blood Pressure (mmHg): 144/81 Capillary Blood Glucose (mg/dl): 141 Reference Range:  80 - 120 mg / dl Electronic Signature(s) Signed: 09/20/2022 4:40:02 PM By: Adline Peals Entered By: Adline Peals on 09/20/2022 08:40:08

## 2022-09-21 NOTE — Progress Notes (Signed)
[name] ([id number]) [id number] Page 1 of 20 Visit Report for 09/20/2022 Chief Complaint Document Details Patient Name: Date of Service: [name], [name] 09/20/2022 8:30 A M Medical Record Number: [id number] Patient Account Number: [id number] Date of Birth/Sex: Treating RN: [birth year] (72 y.o. M) Primary Care Provider: Jilda [name] Other Clinician: Referring Provider: Treating Provider/Extender: [name] in Treatment: 12 Information Obtained from: Patient Chief Complaint Left leg and foot ulcers 04/12/2021; patient is here for wounds on his left lower leg and left plantar foot over the first metatarsal head Electronic Signature(s) Signed: 09/20/2022 9:42:28 AM By: [name] Maudlin MD FACS Entered By: [name] on 09/20/2022 09:42:28 -------------------------------------------------------------------------------- Debridement Details Patient Name: Date of Service: [name]. 09/20/2022 8:30 A M Medical Record Number: [id number] Patient Account Number: [id number] Date of Birth/Sex: Treating RN: [birth year] (72 y.o. [name] Primary Care Provider: Jilda [name] Other Clinician: Referring Provider: Treating Provider/Extender: [name] in Treatment: 75 Debridement Performed for Assessment: Wound #18R Left,Plantar Metatarsal head first Performed By: Physician [name] Maudlin, MD Debridement Type: Debridement Severity of Tissue Pre Debridement: Fat layer exposed Level of Consciousness (Pre-procedure): Awake and Alert Pre-procedure Verification/Time Out Yes - 08:54 Taken: Start Time: 08:54 Pain Control: Lidocaine 4% T opical Solution T Area Debrided (L x W): otal 1.2 (cm) x 2.5 (cm) = 3 (cm) Tissue and other material debrided: Non-Viable, Callus, Slough, Slough Level: Non-Viable Tissue Debridement Description: Selective/Open Wound Instrument: Curette Bleeding:  Minimum Hemostasis Achieved: Pressure Response to Treatment: Procedure was tolerated well Level of Consciousness (Post- Awake and Alert procedure): Post Debridement Measurements of Total Wound Length: (cm) 1.2 Width: (cm) 2.5 Depth: (cm) 0.1 Volume: (cm) 0.236 Character of Wound/Ulcer Post Debridement: Improved Severity of Tissue Post Debridement: Fat layer exposed Post Procedure Diagnosis Same as Pre-procedure Notes scribed for Dr. Celine [name] by [name] Peals, RN Electronic Signature(s) Signed: 09/20/2022 11:00:06 AM By: [name] Maudlin MD FACS Signed: 09/20/2022 4:40:02 PM By: [name] ([id number]) [id number] Page 2 of 20 Entered By: [name] on 09/20/2022 08:56:08 -------------------------------------------------------------------------------- Debridement Details Patient Name: Date of Service: [name], [name] 09/20/2022 8:30 A M Medical Record Number: [id number] Patient Account Number: [id number] Date of Birth/Sex: Treating RN: 1950/09/27 (72 y.o. [name] Primary Care Provider: Jilda [name] Other Clinician: Referring Provider: Treating Provider/Extender: [name] in Treatment: 75 Debridement Performed for Assessment: Wound #22 Left,Proximal,Lateral Lower Leg Performed By: Physician [name] Maudlin, MD Debridement Type: Debridement Level of Consciousness (Pre-procedure): Awake and Alert Pre-procedure Verification/Time Out Yes - 08:54 Taken: Start Time: 08:54 Pain Control: Lidocaine 4% T opical Solution T Area Debrided (L x W): otal 2 (cm) x 1 (cm) = 2 (cm) Tissue and other material debrided: Non-Viable, Slough, Slough Level: Non-Viable Tissue Debridement Description: Selective/Open Wound Instrument: Curette Bleeding: Minimum Hemostasis Achieved: Pressure Response to Treatment: Procedure was tolerated well Level of Consciousness (Post- Awake and  Alert procedure): Post Debridement Measurements of Total Wound Length: (cm) 5 Width: (cm) 1 Depth: (cm) 0.1 Volume: (cm) 0.393 Character of Wound/Ulcer Post Debridement: Improved Post Procedure Diagnosis Same as Pre-procedure Notes scribed for Dr. Celine [name] by [name] Peals, RN Electronic Signature(s) Signed: 09/20/2022 11:00:06 AM By: [name] Maudlin MD FACS Signed: 09/20/2022 4:40:02 PM By: [name] Entered By: [name] on 09/20/2022 08:56:53 -------------------------------------------------------------------------------- HPI Details Patient Name: Date of Service: [name]. 09/20/2022 8:30 A M Medical Record Number: [id number] Patient Account Number: [id number] Date of Birth/Sex: Treating RN: Feb 24, 1951 (72 y.o.  M) Primary Care Provider: Jilda [name] Other Clinician: Referring Provider: Treating Provider/Extender: [name] in Treatment: 81 History of Present Illness HPI Description: 10/11/17; [name] is a 72 year old man who tells me that in 2015 he slipped down the latter traumatizing his left leg. He developed a wound in the same spot the area that we are currently looking at. He states this closed over for the most part although he always felt it was somewhat unstable. In 2016 he hit the same area with the door of his car had this reopened. He tells me that this is never really closed although sometimes an inflow it remains open on a constant basis. He has not been using any specific dressing to this except for topical antibiotics the nature of which were not really sure. His primary doctor did send him to see Dr. Einar [name] of interventional cardiology. He underwent an angiogram on 08/06/17 and he underwent a PTA and directional atherectomy of the lesser distal SFA and popliteal arteries which resulted in brisk improvement in blood flow. It was noted that he had 2 vessel runoff through the anterior tibial and peroneal. He is also  been to see vascular and interventional radiologist. He was not felt to have any significant superficial venous insufficiency. Presumably is not a candidate for any ablation. It was suggested he come here for wound care. The patient is a type II diabetic on insulin. He also has a history of venous insufficiency. [name] ([id number]) [id number] Page 3 of 20 ABIs on the left were noncompressible in our clinic 10/21/17; patient we admitted to the clinic last week. 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 We put Iodosorb on him after an aggressive debridement and 3 layer compression. He complained of pain in his ankle and itching with is skin in fact he scratched the area on the medial calf superiorly at the rim of our wraps and he has 2 small open areas in that location today which are new. I changed his primary dressing today to silver collagen. As noted he is already had revascularization and does not have any significant superficial venous insufficiency that would be amenable to ablation 10/28/17; patient admitted to the clinic 2 weeks ago. He has a smaller Wound. Scratch injury from last week revealed. There is large wound over the tibial area. This is smaller. Granulation looks healthy. No need for debridement. 11/04/17; the wound on the left lateral calf looks better. Improved dimensions. Surface of this looks better. We've been maintaining him and Kerlix Coban wraps. He finds this much more comfortable. Silver collagen dressing 11/11/17; left lateral Wound continues to look healthy be making progress. Using a #5 curet I removed removed nonviable skin from the surface of the wound and then necrotic debris from the wound surface. Surface of the wound continues to look healthy. He also has an open area on the left great toenail bed. We've been using topical antibiotics. 11/19/17; left anterior lateral  wound continues to look healthy but it's not closed. He also had a small wound above this on the left leg Initially traumatic wounds in the setting of significant chronic venous insufficiency and stasis dermatitis 11/25/17; left anterior wounds superiorly is closed still a small wound inferiorly. 12/02/17; left anterior tibial area. Arrives today with adherent callus. Post debridement clearly not completely closed. Hydrofera Blue under 3 layer compression. 12/09/17; left anterior tibia. Circumferential eschar however the wound  bed looks stable to improved. We've been using Hydrofera Blue under 3 layer compression 12/17/17; left anterior tibia. Apparently this was felt to be closed however when the wrap was taken off there is a skin tear to reopen wounds in the same area we've been using Hydrofera Blue under 3 layer compression 12/23/17 left anterior tibia. Not close to close this week apparently the [facility] was stuck to this again. Still circumferential eschar requiring debridement. I put a contact layer on this this time under the Hydrofera Blue 12/31/17; left anterior tibia. Wound is better slight amount of hyper-granulation. Using Hydrofera Blue over Adaptic. 01/07/18; left anterior tibia. The wound had some surface eschar however after this was removed he has no open wound.he was already revascularized by Dr. Einar [name] when he came to our clinic with atherectomy of the left SFA and popliteal artery. He was also sent to interventional radiology for venous reflux studies. He was not felt to have significant reflux but certainly has chronic venous changes of his skin with hemosiderin deposition around this area. He will definitely need to lubricate his skin and wear compression stocking and I've talked to him about this. READMISSION 05/26/2018 This is a now 72 year old man we cared for with traumatic wounds on his left anterior lower extremity. He had been previously revascularized during that admission  by Dr. Einar [name]. Apparently in follow-up Dr. Einar [name] noted that he had deterioration in his arterial status. He underwent a stent placement in the distal left SFA on 04/22/2018. 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 He went back to the angiography suite on 04/30/2018 he underwent PTA and balloon angioplasty of the occluded left mid anterior tibial artery, thrombotic occlusion went from 100 to 0% which reconstitutes the posterior tibial artery. He had thrombectomy and aspiration of the peroneal artery. The stent placed in the distal SFA left SFA was still occluded. 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 He has been recuperating at [place] home in [facility] after the most recent hospitalization. He is going home tomorrow. He tells me that 2 weeks ago he traumatized the tip of his left fifth toe. He came in urgently for our review of this. This was a history of before I noted that Dr. Einar [name] had already noted dry gangrenous changes of the left fifth toe 06/09/2018; 2-week follow-up. I did contact Dr. Einar [name] after his last appointment and he apparently saw 1 of Dr. Irven Shelling colleagues the next day. He does not follow-up with Dr. Einar [name] himself until Thursday of this week. 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 Nevertheless there is no evidence of infection no drainage and no pain. He had a new area that this week when we were signing him in today on the left anterior mid tibia area, this is in close proximity to the previous wound we have dealt with in this clinic. 06/23/2018; 2-week follow-up. I did not receive a recent note from Dr. Einar [name] to review today. Our office is trying to obtain this. He is apparently not  planning to do further vascular interventions and wondered about compression to try and help with the patient's chronic venous insufficiency. However we are also concerned about the arterial flow.  He arrives in clinic today with a new area on the left third toe. The areas on the calf/anterior tibia are close to closing. The left fifth toe is still mummified using Betadine. -In reviewing things with the patient he has what sounds like claudication with mild to moderate amount of activity. 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 I prescribed Levaquin over the phone while we attempted to arrange a plan of care. However the patient called yesterday to report he had low-grade fever and he came in today acutely. There is been a marked deterioration in the left third toe with spreading cellulitis up into the dorsal left foot. He was referred to the emergency room. Readmission: 06/29/2020 patient presents today for reevaluation here in our clinic he was previously treated by Dr. Dellia Nims at the latter part of 2019 in 2 the beginning of 2020. Subsequently we have not seen him since that time in the interim he did have evaluation with vein and vascular specialist specifically Dr. Anice Paganini who did perform quite extensive work for a left femoral to anterior tibial artery bypass. With that being said in the interim the patient has developed significant lymphedema and has wounds that he tells me have really never healed in regard to the incision site on the left leg. He also has multiple wounds on the feet for various reasons some of which is that he tends to pick at his feet. Fortunately there is no signs of active infection systemically at this time he does have some wounds that are little bit deeper but most are fairly superficial he seems to have good blood flow and overall everything appears to be healthy I see no bone exposed and no obvious signs of osteomyelitis. I do not  know that he necessarily needs a x-ray at this point although that something we could consider depending on how things progress. The patient does have a history of lymphedema, diabetes, this is type II, chronic kidney disease stage III, hypertension, and history of peripheral vascular disease. 07/05/2020; patient admitted last week. Is a patient I remember from 2019 he had a spreading infection involving the left foot and we sent him to the hospital. 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 He subsequently had a left femoral to anterior tibial bypass by Dr.Cain vein and vascular. He also has severe lymphedema with chronic skin changes related to that on the left leg. The most problematic area that was new today was on the left medial great toe. This was apparently a small area last week there was purulent drainage which our intake nurse cultured. Also areas on the left medial foot and heel left lateral foot. He has 2 areas on the left medial calf left lateral calf in the setting of the severe lymphedema. 07/13/2020 on evaluation today patient appears to be doing better in my opinion compared to his last visit. The good news is there is no signs of active infection systemically and locally I do not see any signs of infection either. He did have an x-ray which was negative that is great news he had a culture which showed MRSA but at the same time he is been on the doxycycline which has helped. I do think we may want to extend this for 7 additional days 1/25; patient admitted to the clinic a few weeks ago. 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 We have been putting him under compression his edema control is a lot better but he is severe verricused skin on the left leg. He is really done quite well he still has an open area on the left medial calf and the left medial first metatarsal head. We have been using silver  collagen on the leg silver alginate on the foot 07/27/2020 upon evaluation today patient appears to be doing decently well in regard to his wounds. He still has a lot of dry skin on the left leg. Some of this is starting to peel back and I think he may be able to have them out by removing some that today. Fortunately there is no signs of active infection at this time on the left leg although on the right leg he does appear to have swelling and erythema as well as some mild warmth to touch. This does have been concerned about the possibility of cellulitis although within the differential diagnosis I do think that potentially a DVT has to be at least considered. We need to rule that out before proceeding would just call in the cellulitis. Especially since he is having pain in the posterior aspect of his calf muscle. [name], [name] ([id number]) [id number] Page 4 of 20 2/8; the patient had seen sparingly. He has severe skin changes of chronic lymphedema in the left leg thickened hyperkeratotic verrucous skin. He has an open wound on the medial part of the left first met head left mid tibia. He also has a rim of nonepithelialized skin in the anterior mid tibia. He brought in the AmLactin lotion that was been prescribed although I am not sure under compression and its utility. There concern about cellulitis on the right lower leg the last time he was here. He was put on on antibiotics. His DVT rule out was negative. The right leg looks fine he is using his stocking on this area 08/10/2020 upon evaluation today patient appears to be doing well with regard to his leg currently. He has been tolerating the dressing changes without complication. Fortunately there is no signs of active infection which is great news. Overall very pleased with where things stand. 2/22; the patient still has an area on the medial part of the left first met his head. This looks better than when I last saw this  earlier this month he has a rim of epithelialization but still some surface debris. Mostly everything on the left leg is healed. There is still a vulnerable in the left mid tibia area. 08/30/2020 upon evaluation today patient appears to be doing much better in regard to his wounds on his foot. 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 Nonetheless I think we will address that today I Minna send in a prescription for him in that regard. Overall though there does not appear to be any signs of significant worsening. 09/07/2020 on evaluation today patient's wounds over his left foot appear to be doing excellent. I do not see any signs of infection there is some callus buildup this can require debridement for certain but overall I feel like he is managing quite nicely. He still using the AmLactin cream which has been beneficial for him as well. 3/22; left foot wound is closed. There is no open area here. He is using ammonium lactate lotion to the lower extremities to help exfoliate  dry cracked skin. He has compression stockings from elastic therapy in [place]. The wound on the medial part of his left first met head is healed today. READMISSION 04/12/2021 Mr. Tarazon is a patient we know fairly well he had a prolonged stay in clinic in 2019 with wounds on his left lateral and left anterior lower extremity in the setting of chronic venous insufficiency. More recently he was here earlier this year with predominantly an area on his left foot first metatarsal head plantar and he says the plantar foot broke down on its not long after we discharged him but he did not come back here. The last few months areas of broken down on his left anterior and again the left lateral lower extremity. The leg itself is very swollen chronically enlarged a lot of hyperkeratotic dry Berry Q skin in  the left lower leg. His edema extends well into the thigh. He was seen by Dr. Donzetta Matters. He had ABIs on 03/02/2021 showing an ABI on the right of 1 with a TBI of 0.72 his ABI in the left at 1.09 TBI of 0.99. Monophasic and biphasic waveforms on the right. On the left monophasic waveforms were noted he went on to have an angiogram on 03/27/2021 this showed the aortic aortic and iliac segments were free of flow-limiting stenosis the left common femoral vein to evaluate the left femoral to anterior tibial artery bypass was unobstructed the bypass was patent without any areas of stenosis. We discharged the patient in bilateral juxta lite stockings but very clearly that was not sufficient to control the swelling and maintain skin integrity. He is clearly going to need compression pumps. 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 This is fortunate because he is on his feet for long periods of time. 10/27; patient comes in with our intake nurse reporting copious amount of green drainage from the left anterior mid tibia the left dorsal foot and to a lesser extent the left medial mid tibia. We left the compression wrap on all week for the amount of edema in his left leg is quite a bit better. We use silver alginate as the primary dressing 11/3; edema control is good. Left anterior lower leg left medial lower leg and the plantar first metatarsal head. The left anterior lower leg required debridement. Deep tissue culture I did of this wound showed MRSA I put him on 10 days of doxycycline which she will start today. We have him in compression wraps. He has a security card and [employer] however he is retiring on November 15. We will need to then get him into a better offloading boot for the left foot perhaps a total contact cast 11/10; edema control is quite good. Left anterior and left medial lower leg wounds in the setting of chronic venous insufficiency and lymphedema.  He also has a substantial area over the left plantar first metatarsal head. 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 He has significant hypergranulation on the left plantar foot wound. The patient is a diabetic but he does not have significant PAD 11/17; edema control is quite good. Left anterior and left medial lower leg wounds look better. The really concerning area remains the area on the left plantar first metatarsal head. He has a rim of epithelialization. He has been using a surgical shoe The  patient is now retired from a a [employer] I have gone over with him the need to offload this area aggressively. Starting today with a forefoot off loader but . possibly a total contact cast. 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 Arrives with a very significant odor from I think all of the wounds on the left leg and the left foot. Because of this I did not put a total contact cast on him today but will could still consider this. His wife was having cataract surgery which is the reason he missed the appointment 12/6. I saw this man 5 days ago with a swelling below the popliteal fossa. I thought he actually might have a Baker's cyst however the DVT rule out study that we could arrange right away was negative the technician told me this was not a ruptured Baker's cyst. We attempted to get this aspirated by under ultrasound guidance in interventional radiology however all they did was an ultrasound however it shows an extensive fluid collection 62 x 8 x 9.4 in the left thigh and left calf. The patient states he thinks this started 8 days ago or so but he really is not complaining of any pain, fever or systemic symptoms. He has not ha 12/20; after some difficulty I managed to get the patient into see  Dr. Donzetta Matters. Eventually he was taken into the hospital and had a drain put in the fluid collection below his left knee posteriorly extending into the posterior thigh. He still has the drain in place. Culture of this showed moderate staff aureus few Morganella and few Klebsiella he is now on doxycycline and ciprofloxacin as suggested by infectious disease he is on this for a month. The drain will remain in place until it stops draining 12/29; he comes in today with the 1 wound on his left leg and the area on the left plantar first met head significantly smaller. Both look healthy. He still has the drain in the left leg. He says he has to change this daily. Follows up with Dr. Donzetta Matters on January 11. 06/29/2021; the wounds that I am following on the left leg and left first met head continued to be quite healthy. However the area where his inferior drain is in place had copious amounts of drainage which was green in color. The wound here is larger. Follows up with Dr. Gwenlyn Saran of vein and vascular his surgeon next week as well as infectious disease. He remains on ciprofloxacin and doxycycline. He is not complaining of excessive pain in either one of the drain areas 1/12; the patient saw vascular surgery and infectious disease. Vascular surgery has left the drain in place as there was still some notable drainage still see him back in 2 weeks. Dr. Velna Ochs stop the doxycycline and ciprofloxacin and I do not believe he follows up with them at this point. Culture I did last week showed both doxycycline resistant MRSA and Pseudomonas not sensitive to ciprofloxacin although only in rare titers 1/19; the patient's wound on the left anterior lower leg is just about healed. We have continued healing of the area that was medially on the left leg. Left first plantar metatarsal head continues to get smaller. The major problem here is his 2 drain sites 1 on the left upper calf and lateral thigh. There is purulent drainage still from  the  left lateral thigh. I gave him antibiotics last week but we still have recultured. He has the drain in the area I think this is eventually going to have to come out. I suspect there will be a connecting wound to heal here perhaps with improved [name], [name] ([id number]) [id number] Page 5 of 20 1/26; the patient had his drain removed by vein and vascular on 1/25/. This was a large pocket of fluid in his left thigh that seem to tunnel into his left upper calf. 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 His mention his Penrose drain was removed today. He now has a tunneling wound on his left calf and left thigh. Both of these probe widely towards each other although I cannot really prove that they connect. Both wounds on his lower leg anteriorly are closed and his area over the first metatarsal head on his right foot continues to improve. We are using Hydrofera Blue here. He also saw infectious disease culture of the abscess they noted was polymicrobial with MRSA, Morganella and Klebsiella he was treated with doxycycline and ciprofloxacin for 4 weeks ending on 07/03/2021. They did not recommend any further antibiotics. Notable that while he still had the Penrose drain in place last week he had purulent drainage coming out of the inferior IandD site this grew [facility], MRSA and Pseudomonas but there does not appear to be any active infection in this area today with the drain out and he is not systemically unwell 2/2; with regards to the drain sites the superior one on the thigh actually is closed down the one on the upper left lateral calf measures about 8 and half centimeters which is an improvement seems to be less prominent although still with a lot of drainage. The only remaining wound is over the first metatarsal head on the left foot and this looks to be continuing to improve with Hydrofera Blue. 2/9; the area on his plantar left foot  continues to contract. Callus around the wound edge. The drain sites specifically have not come down in depth. We put the wound VAC on Monday he changed the canister late last night our intake nurse reported a pocket of fluid perhaps caused by our compression wraps 2/16; continued improvement in left foot plantar wound. drainage site in the calf is not improved in terms of depth (wound vac) 2/23; continued improvement in the left foot wound over the first metatarsal head. With regards to the drain sites the area on his thigh laterally is healed however the open area on his calf is small in terms of circumference by still probes in by about 15 cm. Within using the wound VAC. Hydrofera Blue on his foot 08/24/2021: The left first metatarsal head wound continues to improve. The wound bed is healthy with just some surrounding callus. Unfortunately the open drain site on his calf remains open and tunnels at least 15 cm (the extent of a Q-tip). This is despite several weeks of wound VAC treatment. Based on reading back through the notes, there has been really no significant change in the depth of the wound, although the orifice is smaller and the more cranial wound on his thigh has closed. I suspect the tunnel tracks nearly all the way to this location. 08/31/2021: Continued improvement in the left first metatarsal head wound. There has been absolutely no improvement to the long tunnel from his open drain site on his calf. We have tried to get him into see  vascular surgery sooner to consider the possibility of simply filleting the tract open and allowing it to heal from the bottom up, likely with a wound VAC. They have not yet scheduled a sooner appointment than his current mid April 09/14/2021: He was seen by vascular surgery and they took him to the operating room last week. They opened a portion of the tunnel, but did not extend the entire length of the known open subcutaneous tract. I read Dr. Claretha Cooper  operative note and it is not clear from that documentation why only a portion of the tract was opened. The heaped up granulation tissue was curetted and removed from at least some portion of the tract. They did place a wound VAC and applied an Unna boot to the leg. The ulcer on his left first metatarsal head is smaller today. The bed looks good and there is just a small amount of surrounding callus. 09/21/2021: The ulcer on his left first metatarsal head looks to be stalled. There is some callus surrounding the wound but the wound bed itself does not appear particularly dynamic. The tunnel tract on his lateral left leg seems to be roughly the same length or perhaps slightly smaller but the wound bed appears healthy with good granulation tissue. He opened up a new wound on his medial thigh and the site of a prior surgical incision. He says that he did this unconsciously in his sleep by scratching. 09/28/2021: Unfortunately, the ulcer on his left first metatarsal head has extended underneath the callus toward the dorsum of his foot. The medial thigh wounds are roughly the same. The tunnel on his lateral left leg continues to be problematic; it is longer than we are able to actually probe with a Q-tip. I am still not certain as to why Dr. Donzetta Matters did not open this up entirely when he took the patient to the operating room. We will likely be back in the same situation with just a small superficial opening in a long unhealed tract, as the open portion is granulating in nicely. 10/02/2021: The patient was initially scheduled for a nurse visit, but we are also applying a total contact cast today. The plantar foot wound looks clean without significant accumulated callus. We have been applying Prisma silver collagen to the site. 10/05/2021: The patient is here for his first total contact cast change. We have tried using gauze packing strips in the tunnel on his lateral leg wound, but this does not seem to be working  any better than the white VAC foam. The foot ulcer looks about the same with minimal periwound callus. Medial thigh wound is clean with just some overlying eschar. 10/12/2021: The plantar foot wound is stable without any significant accumulation of periwound callus. The surface is viable with good granulation tissue. The medial thigh wounds are much smaller and are epithelializing. On the other hand, he had purulent drainage coming from the tunnel on his lateral leg. He does go back to see Dr. Donzetta Matters next week and is planning to ask him why the wound tunnel was not completely opened at the time of his most recent operation. 10/19/2021: The plantar foot wound is markedly improved and has epithelial tissue coming through the surface. The medial thigh wounds are nearly closed with just a tiny open area. He did see Dr. Donzetta Matters earlier this week and apparently they did discuss the possibility of opening the sinus tract further and enabling a wound VAC application. Apparently there are some limits as to what  Dr. 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 I think if we could get the tract open to the level of the popliteal fossa, this would greatly aid in her ability to get this chart closed. That being said, however, today when I probed the tract with a Q-tip, I was not able to insert the entirety of the Q-tip as I have on previous occasions. The tunnel is shorter by about 4 cm. The surface is clean with good granulation tissue and no further episodes of purulent drainage. 10/30/2021: Last week, the patient underwent surgery and had the long tract in his leg opened. There was a rind that was debrided, according to the operative report. His medial thigh ulcers are closed. The plantar foot wound is clean with a good surface and some built up surrounding callus. 11/06/2021: The overall dimensions of the large wound on his lateral leg remain about the same, but there is good granulation  tissue present and the tunneling is a little bit shorter. He has a new wound on his anterior tibial surface, in the same location where he had a similar lesion in the past. The plantar foot wound is clean with some buildup surrounding callus. Just toward the medial aspect of his foot, however, there is an area of darkening that once debrided, revealed another opening in the skin surface. 11/13/2021: The anterior tibial surface wound is closed. The plantar foot wound has some surrounding callus buildup. The area of darkening that I debrided last week and revealed an opening in the skin surface has closed again. The tunnel in the large wound on his lateral leg has come in by about 3 cm. There is healthy granulation tissue on the entire wound surface. 11/23/2021: The patient was out of town last week and did wet-to-dry dressings on his large wound. 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 Unfortunately, he picked open the wound on his left medial thigh. He says that it was itching and he just could not stop scratching it until it was open again. The wound on his plantar foot is smaller and has not accumulated a tremendous amount of callus. The lateral leg wound is shallower and the tunnel has also decreased in depth. There is just a little bit of slough accumulation on the surface. 11/30/2021: Another portion of his left medial thigh has been opened up. All of these wounds are fairly superficial with just a little bit of slough and eschar accumulation. The wound on his plantar foot is almost closed with just a bit of eschar and periwound callus accumulation. The lateral leg wound is nearly flush with the surrounding skin and the tunnel is markedly shallower. 12/07/2021: There is just 1 open area on his left medial thigh. It is clean with just a little bit of perimeter eschar. The wound on his plantar foot continues to contract and just has  some eschar and periwound callus accumulation. The lateral leg wound is closing at the more distal aspect and the tunnel is smaller. The surface is nearly flush with the surrounding skin and it has a good bed of granulation tissue. 12/14/2021: The thigh and foot wounds are closed. The lateral leg wound has closed over approximately half of its length. The tunnel continues to contract and the surface is now flush with the surrounding skin. The wound bed has robust granulation tissue. [name], [name] ([id number]) [id number] Page 6 of 20 12/22/2021:  The thigh and foot wounds have reopened. The foot wound has a lot of callus accumulation around and over it. The thigh wound is tiny with just a little bit of slough in the wound bed. The lateral leg wound continues to contract. His vascular surgeon took the wound VAC off earlier in the week and the patient has been doing wet-to-dry dressings. There is a little slough accumulation on the surface. The tunnel is about 3 cm in depth at this point. 12/28/2021: The thigh wound is closed again. The foot wound has some callus that subsequently has peeled back exposing just a small slit of a wound. The lateral leg wound Is down to about half the size that it originally was and the tunnel is down to about half a centimeter in depth. 01/04/2022: The thigh wound remains closed. The foot wound has heavy callus overlying the wound site. Once this was debrided, the wound was found to be closed. The lateral leg wound is smaller again this week and very superficial. No tunnel could be identified. 01/12/2022: The thigh and foot wounds both remain closed. The lateral leg wound is now nearly flush with the skin surface. There is good granulation tissue present with a light layer of slough. 01/19/2022: Due to the way his wrap was placed, the patient did not change the dressing on his thigh at all and so the foam was saturated and his skin is macerated. There  is a light layer of slough on the wound surface. The underlying granulation tissue is robust and healthy-appearing. He has heavy callus buildup at the site of his first metatarsal head wound which is still healed. 02/01/2022: He has been in silver alginate. When he removed the dressing from his thigh wound, however, some leg, superficially reopening a portion of the wound that had healed. In addition, underneath the callus at his left first metatarsal head, there appears to be a blister and the wound appears to be open again. 02/08/2022: The lateral leg wound has contracted substantially. There is eschar and a light layer of slough present. He says that it is starting to pull and is uncomfortable. On inspection, there is some puckering of the scar and the eschar is quite dry; this may account for his symptoms. On his first metatarsal head, the wound is much smaller with just some eschar on the surface. The callus has not reaccumulated. He reports that he had a blister come up on his medial thigh wound at the distal aspect. It popped and there is now an opening in his skin again. Looking back through his [place] of wound photos, there is what looks like a permanent suture just deep to this location and it may be trying to erode through. We have been using silver alginate on his wounds. 02/15/2022: The lateral leg wound is about half the size it was last week. It is clean with just a little perimeter eschar and light slough. The wound on his first metatarsal head is about the same with heavy callus overlying it. The medial thigh wound is closed again. He does have some skin changes on the top of his foot that looks potentially yeast related. 02/22/2022: The skin on the top of his foot improved with the use of a topical antifungal. The lateral leg wound continues to contract and is again smaller this week. There is a little bit of slough and eschar on the surface. The first metatarsal head wound is a little  bit smaller but has reaccumulated a  thick callus over the top. He decided to try to trim his toenail and ultimately took the entire nail off of his left great toe. 03/02/2022: His lateral leg wound continues to improve, as does the wound on his left great toe. Unfortunately, it appears that somehow his foot got wet and moisture seeped in through the opening causing his skin to lift. There is a large wound now overlying his first metatarsal on both the plantar, medial, and dorsal portion of his foot. There is necrotic tissue and slough present underneath the shaggy macerated skin. 03/08/2022: The lateral leg wound is smaller again today. There is just a light layer of slough and eschar on the surface. The great toe wound is smaller again today. The first metatarsal wound is a little bit smaller today and does not look nearly as necrotic and macerated. There is still slough and nonviable tissue present. 03/15/2022: The lateral leg wound is narrower and just has a little bit of light slough buildup. The first metatarsal wound still has a fair amount of moisture affecting the periwound skin. The great toe wound is healed. 03/22/2022: The lateral leg wound is now isolated to just at the level of his knee. There is some eschar and slough accumulation. The first metatarsal head wound has epithelialized tremendously and is about half the size that it was last week. He still has some maceration on the top of his foot and a fungal odor is present. 03/29/2022: T oday the patient's foot was macerated, suggesting that the cast got wet. The patient has also been picking at his dry skin and has enlarged the wound on his left lateral leg. In the time between having his cast removed and my evaluation, he had picked more dry skin and opened up additional wounds on his Achilles area and dorsal foot. The plantar first metatarsal head wound, however, is smaller and clean with just macerated callus around the perimeter  and light slough on the surface. The lateral leg wound measured a little bit larger but is also fairly clean with eschar and minimal slough. 04/02/2022: The patient had vascular studies done last Friday and so his cast was not applied. He is here today to have that done. 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 04/05/2022: Both wounds are smaller and quite clean. There is just a little biofilm on the lateral leg wound. 10/20; the patient has a wound on the left lateral surgical incision at the level of his lateral knee this looks clean and improved. He is using silver alginate. He also has an area on his left medial foot for which she is using Hydrofera Blue under a total contact cast both wounds are measuring smaller 04/20/2022: The plantar foot wound has contracted considerably and is very close to closing. The lateral leg wound was measured a little larger, but there was a tiny open area that was included in the measurements that was not included last week. He has some eschar around the perimeter but otherwise the wound looks clean. 04/27/2022: The lateral leg wound looks better this week. He says that midweek, he felt it was very dry and began applying hydrogel to the site. I think this was beneficial. The foot wound is nearly closed underneath a thick layer of dry skin and callus. 05/04/2022: The foot wound is healed. He has developed a new small ulcer on his anterior tibial surface about midway up his leg. It has a little slough on the surface. The lateral leg  wound still is fairly dry, but clean with just a little biofilm on the surface. 05/11/2022: The wound on his foot reopened on Wednesday. A large blister formed which then broke open revealing the fat layer underneath. The ulcer on his anterior tibial surface is a little bit larger this week. The lateral leg wound has much better moisture balance this week. Fortunately, prior to his foot wound reopening, he did get the cast made  for his orthotic. 05/15/2022: Already, the left medial foot wound has improved. The tissue is less macerated and the surface is clean. The ulcer on his anterior tibial surface continues to enlarge. This seems likely secondary to accumulated moisture. The lateral leg wound continues to have an improved moisture balance with the use of collagen. 05/25/2022: The medial foot wound continues to contract. It is now substantially smaller with just a little slough on the surface. The anterior tibial surface wound continues to enlarge further. Once again, this seems to be secondary to moisture. The lateral leg wound does not seem to be changing much in size, but the moisture balance is better. 06/01/2022: The anterior tibial wound is closed. The medial foot wound is down to just a very small, couple of millimeters, opening. The lateral leg wound has good moisture balance, but remains unchanged in size. 12/15; the patient's anterior tibial wound has reopened, however the area on his right first metatarsal head is closed. The major wound is actually on the superior part of his surgical wound in the left lateral thigh. Not a completely viable surface under illumination. This may at some point require a debridement I think he is currently using Prisma. As noted the left medial foot wound has closed [name], [name] ([id number]) [id number] Page 7 of 20 06/14/2022: The anterior tibial wound has closed. The lateral leg wound has a better surface but is basically unchanged in size. The left medial foot wound has reopened. It looks as though there was some callus accumulation and moisture got under the callus which caused the tissue to break down again. 06/21/2022: A new wound has opened up just distal to the previous anterior tibial wound. It is small but has hypertrophic granulation tissue present. The lateral leg wound is a little bit narrower and has a layer of slough on the surface. The  left medial foot wound is down to just a pinhole. His custom orthotics should be available next week. 06/28/2022: The wound on his first metatarsal head has healed. He has developed a new small wound on his medial lower leg, in an old scar site. The lateral leg wound continues to contract but continues to accumulate slough, as well. 07/03/2022: Despite wearing his custom orthopedic shoes, he managed to reopen the wound on his first metatarsal head. He says he thinks his foot got wet and then some skin lifted up and he peeled this away. Both of the lower leg wounds are smaller and have some dry eschar on the surface. The lateral leg wound is quite a bit narrower today. 07/12/2022: The medial lower leg wound is closed. The anterior lower leg wound has contracted considerably. The lateral upper leg wound is narrower with a layer of slough on the surface. The first metatarsal head wound is also smaller, but had copious drainage which saturated the foam border dressing and resulted in some periwound tissue maceration. Fortunately there was no breakdown at this site. 07/19/2022: The lower leg shows signs of significant maceration; I think he must be sweating excessively inside his  cast. There are several areas of skin breakdown present. The wound on his foot is smaller and that on his lateral leg is narrower and is shorter by about a centimeter. 07/26/2022: Last week we used a zinc Coflex wrap prior to applying his total contact cast and this has had the effect of keeping his skin from getting macerated this week. The anterior leg wound has epithelialized substantially. The lateral leg wound is significantly smaller with just a bit of slough on the surface. The first metatarsal head wound is also smaller this week. 08/02/2022: The anterior leg wound was closed on arrival, but while he was sitting in the room, he picked it open again. The lateral leg wound is smaller with just a little slough on the surface and the  first metatarsal head wound has contracted further, as well. 08/09/2022: The first metatarsal head wound is covered with callus. Underneath the callus, it is nearly completely closed. The lateral leg wound is smaller again this week. The anterior leg wound looks better, but he has such heavy buildup of old skin, that moisture is getting underneath this, becoming trapped, and causing the underlying skin to get macerated and open up. 08/16/2022: The first metatarsal head wound is closed. The lateral leg wound continues to contract and is quite a bit smaller again this week. There is just a small, superficial opening remaining on his anterior tibial surface. 08/23/2022: The first metatarsal head wound has, by some miracle, remained closed. The lateral leg wound is substantially smaller with multiple areas of epithelialization. The anterior tibial surface wound is also quite a bit smaller and very clean. 08/30/2022: Unfortunately, his first metatarsal head wound opened up again. It happened in the same fashion as it has on prior occasions. Moisture got under dried skin/callus and created a wound when he removed his sock, taking the skin with it. The anterior tibial surface has a thick shell of hyperkeratotic skin. This has been contributing to ongoing repeat wounding events as moisture gets underneath this and causes tissue breakdown. 3/15; patient presents for follow-up. His anterior left leg wound has healed. He still has the wound to the left lateral aspect and left first met head. We have been using silver alginate and endoform to these areas under [employer]. He has no issues or complaints today. He has been taking Augmentin and reports improvement to his symptoms to the left first met head. 09/13/2022: He has accumulated more thick dry skin in sheets on his lower leg. The lateral leg wound is about the same size and the left first metatarsal head wound is a little bit smaller. There is slough on both  surfaces. There is callus buildup around the foot wound. 09/12/2022: The lateral leg wound is a little bit narrower and the left first metatarsal head wound also seems to have contracted slightly. There is slough on both surfaces. He has a little skin breakdown on his anterior tibial surface. Electronic Signature(s) Signed: 09/20/2022 9:43:18 AM By: [name] Maudlin MD FACS Entered By: [name] on 09/20/2022 09:43:18 -------------------------------------------------------------------------------- Physical Exam Details Patient Name: Date of Service: [name]. 09/20/2022 8:30 A M Medical Record Number: [id number] Patient Account Number: [id number] Date of Birth/Sex: Treating RN: 1950-07-19 (72 y.o. M) Primary Care Provider: Jilda [name] Other Clinician: Referring Provider: Treating Provider/Extender: [name] in Treatment: 7 Constitutional Slightly hypertensive. . . . no acute distress. Respiratory Normal work of breathing on room air. Notes 09/12/2022: The lateral leg wound is a  little bit narrower and the left first metatarsal head wound also seems to have contracted slightly. There is slough on both surfaces. He has a little skin breakdown on his anterior tibial surface. Electronic Signature(s) Signed: 09/20/2022 9:43:46 AM By: [name] Maudlin MD FACS Entered By: [name] on 09/20/2022 09:43:46 Gracia, Arnette Norris ([id number]) [id number] Page 8 of 20 -------------------------------------------------------------------------------- Physician Orders Details Patient Name: Date of Service: QUALYN, PONZIO 09/20/2022 8:30 A M Medical Record Number: [id number] Patient Account Number: [id number] Date of Birth/Sex: Treating RN: Jan 01, 1951 (72 y.o. [name] Primary Care Provider: Jilda [name] Other Clinician: Referring Provider: Treating Provider/Extender: [name] in Treatment:  32 Verbal / Phone Orders: No Diagnosis Coding ICD-10 Coding Code Description [phone] Chronic venous hypertension (idiopathic) with inflammation of left lower extremity L97.828 Non-pressure chronic ulcer of other part of left lower leg with other specified severity L97.528 Non-pressure chronic ulcer of other part of left foot with other specified severity E11.51 Type 2 diabetes mellitus with diabetic peripheral angiopathy without gangrene I89.0 Lymphedema, not elsewhere classified L85.9 Epidermal thickening, unspecified Follow-up Appointments ppointment in 1 week. - Dr [name] - Room 2 Return A Anesthetic Wound #22 Left,Proximal,Lateral Lower Leg (In clinic) Topical Lidocaine 4% applied to wound bed Bathing/ Shower/ Hygiene May shower with protection but do not get wound dressing(s) wet. Protect dressing(s) with water repellant cover (for example, large plastic bag) or a cast cover and may then take shower. Edema Control - Lymphedema / SCD / Other Avoid standing for long periods of time. Patient to wear own compression stockings every day. - on right leg; Moisturize legs daily. Compression stocking or Garment 20-30 mm/Hg pressure to: - left leg daily Off-Loading Wound #18R Left,Plantar Metatarsal head first Open toe surgical shoe to: - Front off loader Left ft Other: - minimal weight bearing left foot Wound Treatment Wound #18R - Metatarsal head first Wound Laterality: Plantar, Left Cleanser: Soap and Water 1 x Per Week/30 Days Discharge Instructions: May shower and wash wound with dial antibacterial soap and water prior to dressing change. Cleanser: Wound Cleanser 1 x Per Week/30 Days Discharge Instructions: Cleanse the wound with wound cleanser prior to applying a clean dressing using gauze sponges, not tissue or cotton balls. Prim Dressing: Sorbalgon AG Dressing 2x2 (in/in) 1 x Per Week/30 Days ary Discharge Instructions: Apply to wound bed as instructed Secondary Dressing:  Optifoam Non-Adhesive Dressing, 4x4 in 1 x Per Week/30 Days Discharge Instructions: Apply over primary dressing as directed. Secondary Dressing: Zetuvit Plus 4x8 in 1 x Per Week/30 Days Discharge Instructions: Apply over primary dressing as directed. Compression Wrap: CoFlex Zinc Unna Boot, 4 x 6 (in/yd) 1 x Per Week/30 Days Discharge Instructions: Apply Coflex Zinc [employer] as directed. Wound #22 - Lower Leg Wound Laterality: Left, Lateral, Proximal Cleanser: Soap and Water 3 x Per Week/30 Days Discharge Instructions: May shower and wash wound with dial antibacterial soap and water prior to dressing change. Cleanser: Wound Cleanser 3 x Per Week/30 Days Discharge Instructions: Cleanse the wound with wound cleanser prior to applying a clean dressing using gauze sponges, not tissue or cotton balls. Peri-Wound Care: Sween Lotion (Moisturizing lotion) 3 x Per Week/30 Days [name], [name] ([id number]) [id number] Page 9 of 20 Discharge Instructions: Apply moisturizing lotion to the leg Topical: Skintegrity Hydrogel 4 (oz) 3 x Per Week/30 Days Discharge Instructions: Apply hydrogel as directed Prim Dressing: Endoform 2x2 in 3 x Per Week/30 Days ary Discharge Instructions: Moisten with Hydrogel or saline Secondary Dressing:  ABD Pad, 8x10 (Generic) 3 x Per Week/30 Days Discharge Instructions: Apply over primary dressing as directed. Secured With: Elastic Bandage 4 inch (ACE bandage) 3 x Per Week/30 Days Discharge Instructions: Secure with ACE bandage as directed. Patient Medications llergies: No Known Drug Allergies A Notifications Medication Indication Start End 09/20/2022 lidocaine DOSE topical 4 % cream - cream topical Electronic Signature(s) Signed: 09/20/2022 11:00:06 AM By: [name] Maudlin MD FACS Entered By: [name] on 09/20/2022 09:44:22 -------------------------------------------------------------------------------- Problem List Details Patient  Name: Date of Service: [name]. 09/20/2022 8:30 A M Medical Record Number: [id number] Patient Account Number: [id number] Date of Birth/Sex: Treating RN: 1950-11-04 (72 y.o. M) Primary Care Provider: Jilda [name] Other Clinician: Referring Provider: Treating Provider/Extender: [name] in Treatment: 56 Active Problems ICD-10 Encounter Code Description Active Date MDM Diagnosis I87.322 Chronic venous hypertension (idiopathic) with inflammation of left lower 04/12/2021 No Yes extremity L97.828 Non-pressure chronic ulcer of other part of left lower leg with other specified 04/12/2021 No Yes severity L97.528 Non-pressure chronic ulcer of other part of left foot with other specified 08/26/2022 No Yes severity E11.51 Type 2 diabetes mellitus with diabetic peripheral angiopathy without gangrene 04/12/2021 No Yes I89.0 Lymphedema, not elsewhere classified 04/12/2021 No Yes L85.9 Epidermal thickening, unspecified 08/30/2022 No Yes Inactive Problems [name], [name] ([id number]) [id number] Page 10 of 20 ICD-10 Code Description Active Date Inactive Date E11.621 Type 2 diabetes mellitus with foot ulcer 04/12/2021 04/12/2021 L97.828 Non-pressure chronic ulcer of other part of left lower leg with other specified severity 06/08/2022 06/08/2022 E11.42 Type 2 diabetes mellitus with diabetic polyneuropathy 04/12/2021 04/12/2021 L02.416 Cutaneous abscess of left lower limb 06/13/2021 06/13/2021 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 07/20/2021 07/20/2021 Resolved Problems Electronic Signature(s) Signed: 09/20/2022 9:41:02 AM By: [name] Maudlin MD FACS Entered By: [name] on 09/20/2022 09:41:02 -------------------------------------------------------------------------------- Progress Note Details Patient Name: Date of Service: [name]. 09/20/2022 8:30 A M Medical Record Number:  [id number] Patient Account Number: [id number] Date of Birth/Sex: Treating RN: Jul 22, 1950 (72 y.o. M) Primary Care Provider: Jilda [name] Other Clinician: Referring Provider: Treating Provider/Extender: [name] in Treatment: 16 Subjective Chief Complaint Information obtained from Patient Left leg and foot ulcers 04/12/2021; patient is here for wounds on his left lower leg and left plantar foot over the first metatarsal head History of Present Illness (HPI) 10/11/17; Mr. Acha is a 72 year old man who tells me that in 2015 he slipped down the latter traumatizing his left leg. He developed a wound in the same spot the area that we are currently looking at. He states this closed over for the most part although he always felt it was somewhat unstable. In 2016 he hit the same area with the door of his car had this reopened. He tells me that this is never really closed although sometimes an inflow it remains open on a constant basis. He has not been using any specific dressing to this except for topical antibiotics the nature of which were not really sure. His primary doctor did send him to see Dr. Einar [name] of interventional cardiology. He underwent an angiogram on 08/06/17 and he underwent a PTA and directional atherectomy of the lesser distal SFA and popliteal arteries which resulted in brisk improvement in blood flow. It was noted that he had 2 vessel runoff through the anterior tibial and peroneal. He is also been to see vascular and interventional radiologist. He was not felt to have any significant superficial venous  insufficiency. Presumably is not a candidate for any ablation. It was suggested he come here for wound care. The patient is a type II diabetic on insulin. He also has a history of venous insufficiency. ABIs on the left were noncompressible in our clinic 10/21/17; patient we admitted to the clinic last week. He has a fairly large chronic ulcer on the left lateral  calf in the setting of chronic venous insufficiency. We put Iodosorb on him after an aggressive debridement and 3 layer compression. He complained of pain in his ankle and itching with is skin in fact he scratched the area on the medial calf superiorly at the rim of our wraps and he has 2 small open areas in that location today which are new. I changed his primary dressing today to silver collagen. As noted he is already had revascularization and does not have any significant superficial venous insufficiency that would be amenable to ablation 10/28/17; patient admitted to the clinic 2 weeks ago. He has a smaller Wound. Scratch injury from last week revealed. There is large wound over the tibial area. This is smaller. Granulation looks healthy. No need for debridement. 11/04/17; the wound on the left lateral calf looks better. Improved dimensions. Surface of this looks better. We've been maintaining him and Kerlix Coban wraps. He finds this much more comfortable. Silver collagen dressing 11/11/17; left lateral Wound continues to look healthy be making progress. Using a #5 curet I removed removed nonviable skin from the surface of the wound and then necrotic debris from the wound surface. Surface of the wound continues to look healthy. ooHe also has an open area on the left great toenail bed. We've been using topical antibiotics. 11/19/17; left anterior lateral wound continues to look healthy but it's not closed. ooHe also had a small wound above this on the left leg ooInitially traumatic wounds in the setting of significant chronic venous insufficiency and stasis dermatitis 11/25/17; left anterior wounds superiorly is closed still a small wound inferiorly. 12/02/17; left anterior tibial area. Arrives today with adherent callus. Post debridement clearly not completely closed. Hydrofera Blue under 3 layer compression. 12/09/17; left anterior tibia. Circumferential eschar however the wound bed looks stable to  improved. We've been using Hydrofera Blue under 3 layer compression 12/17/17; left anterior tibia. Apparently this was felt to be closed however when the wrap was taken off there is a skin tear to reopen wounds in the same area we've been using Hydrofera Blue under 3 layer compression 12/23/17 left anterior tibia. Not close to close this week apparently the [facility] was stuck to this again. Still circumferential eschar requiring debridement. I put a contact layer on this this time under the [facility] ([id number]) [id number] Page 11 of 20 12/31/17; left anterior tibia. Wound is better slight amount of hyper-granulation. Using Hydrofera Blue over Adaptic. 01/07/18; left anterior tibia. The wound had some surface eschar however after this was removed he has no open wound.he was already revascularized by Dr. Einar [name] when he came to our clinic with atherectomy of the left SFA and popliteal artery. He was also sent to interventional radiology for venous reflux studies. He was not felt to have significant reflux but certainly has chronic venous changes of his skin with hemosiderin deposition around this area. He will definitely need to lubricate his skin and wear compression stocking and I've talked to him about this. READMISSION 05/26/2018 This is a now 72 year old man we cared for with traumatic wounds on his left  anterior lower extremity. He had been previously revascularized during that admission by Dr. Einar [name]. Apparently in follow-up Dr. Einar [name] noted that he had deterioration in his arterial status. He underwent a stent placement in the distal left SFA on 04/22/2018. 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 He went back to the angiography suite on 04/30/2018 he underwent PTA and balloon angioplasty of the occluded left mid anterior tibial artery, thrombotic occlusion went from 100 to 0% which reconstitutes the posterior tibial artery. He had  thrombectomy and aspiration of the peroneal artery. The stent placed in the distal SFA left SFA was still occluded. 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 He has been rec[facility] after the most recent hospitalization. He is going home tomorrow. He tells me that 2 weeks ago he traumatized the tip of his left fifth toe. He came in urgently for our review of this. This was a history of before I noted that Dr. Einar [name] had already noted dry gangrenous changes of the left fifth toe 06/09/2018; 2-week follow-up. I did contact Dr. Einar [name] after his last appointment and he apparently saw 1 of Dr. Irven Shelling colleagues the next day. He does not follow-up with Dr. Einar [name] himself until Thursday of this week. 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 Nevertheless there is no evidence of infection no drainage and no pain. He had a new area that this week when we were signing him in today on the left anterior mid tibia area, this is in close proximity to the previous wound we have dealt with in this clinic. 06/23/2018; 2-week follow-up. I did not receive a recent note from Dr. Einar [name] to review today. Our office is trying to obtain this. He is apparently not planning to do further vascular interventions and wondered about compression to try and help with the patient's chronic venous insufficiency. However we are also concerned about the arterial flow. ooHe arrives in clinic today with a new area on the left third toe. The areas on the calf/anterior tibia are close to closing. The left fifth toe is still mummified using Betadine. -In reviewing things with the patient he has what sounds like claudication with mild to  moderate amount of activity. 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 I prescribed Levaquin over the phone while we attempted to arrange a plan of care. However the patient called yesterday to report he had low-grade fever and he came in today acutely. There is been a marked deterioration in the left third toe with spreading cellulitis up into the dorsal left foot. He was referred to the emergency room. Readmission: 06/29/2020 patient presents today for reevaluation here in our clinic he was previously treated by Dr. Dellia Nims at the latter part of 2019 in 2 the beginning of 2020. Subsequently we have not seen him since that time in the interim he did have evaluation with vein and vascular specialist specifically Dr. Anice Paganini who did perform quite extensive work for a left femoral to anterior tibial artery bypass. With that being said in the interim the patient has developed significant lymphedema and has wounds that he tells me have really never healed  in regard to the incision site on the left leg. He also has multiple wounds on the feet for various reasons some of which is that he tends to pick at his feet. Fortunately there is no signs of active infection systemically at this time he does have some wounds that are little bit deeper but most are fairly superficial he seems to have good blood flow and overall everything appears to be healthy I see no bone exposed and no obvious signs of osteomyelitis. I do not know that he necessarily needs a x-ray at this point although that something we could consider depending on how things progress. The patient does have a history of lymphedema, diabetes, this is type II, chronic kidney disease stage III, hypertension, and history of peripheral vascular disease. 07/05/2020; patient admitted last week. Is a patient I remember from 2019 he had a spreading infection involving the left foot and we sent him to the hospital. 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 He subsequently had a left femoral to anterior tibial bypass by Dr.Cain vein and vascular. He also has severe lymphedema with chronic skin changes related to that on the left leg. The most problematic area that was new today was on the left medial great toe. This was apparently a small area last week there was purulent drainage which our intake nurse cultured. Also areas on the left medial foot and heel left lateral foot. He has 2 areas on the left medial calf left lateral calf in the setting of the severe lymphedema. 07/13/2020 on evaluation today patient appears to be doing better in my opinion compared to his last visit. The good news is there is no signs of active infection systemically and locally I do not see any signs of infection either. He did have an x-ray which was negative that is great news he had a culture which showed MRSA but at the same time he is been on the doxycycline which has helped. I do think we may want to extend this for 7 additional days 1/25; patient admitted to the clinic a few weeks ago. 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 We have been putting him under compression his edema control is a lot better but he is severe verricused skin on the left leg. He is really done quite well he still has an open area on the left medial calf and the left medial first metatarsal head. We have been using silver collagen on the leg silver alginate on the foot 07/27/2020 upon evaluation today patient appears to be doing decently well in regard to his wounds. He still has a lot of dry skin on the left leg. Some of this is starting to peel back and I think he may be able to have them out by removing some that today. Fortunately there is no signs of active infection at this time on the left leg although on the right leg he does appear to have swelling and erythema as well as  some mild warmth to touch. This does have been concerned about the possibility of cellulitis although within the differential diagnosis I do think that potentially a DVT has to be at least considered. We need to rule that out before proceeding would just call in the cellulitis. Especially since he is having pain in the posterior aspect of his calf muscle. 2/8; the patient had seen sparingly. He has severe  skin changes of chronic lymphedema in the left leg thickened hyperkeratotic verrucous skin. He has an open wound on the medial part of the left first met head left mid tibia. He also has a rim of nonepithelialized skin in the anterior mid tibia. He brought in the AmLactin lotion that was been prescribed although I am not sure under compression and its utility. There concern about cellulitis on the right lower leg the last time he was here. He was put on on antibiotics. His DVT rule out was negative. The right leg looks fine he is using his stocking on this area 08/10/2020 upon evaluation today patient appears to be doing well with regard to his leg currently. He has been tolerating the dressing changes without complication. Fortunately there is no signs of active infection which is great news. Overall very pleased with where things stand. 2/22; the patient still has an area on the medial part of the left first met his head. This looks better than when I last saw this earlier this month he has a rim of epithelialization but still some surface debris. Mostly everything on the left leg is healed. There is still a vulnerable in the left mid tibia area. 08/30/2020 upon evaluation today patient appears to be doing much better in regard to his wounds on his foot. 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 Nonetheless I think we will address that today I Minna send in a  prescription for him in that regard. Overall though there does not appear to be any signs of significant worsening. 09/07/2020 on evaluation today patient's wounds over his left foot appear to be doing excellent. I do not see any signs of infection there is some callus buildup this can require debridement for certain but overall I feel like he is managing quite nicely. He still using the AmLactin cream which has been beneficial for him as well. 3/22; left foot wound is closed. There is no open area here. He is using ammonium lactate lotion to the lower extremities to help exfoliate dry cracked skin. He has compression stockings from elastic therapy in [place]. The wound on the medial part of his left first met head is healed today. READMISSION 04/12/2021 Mr. Old is a patient we know fairly well he had a prolonged stay in clinic in 2019 with wounds on his left lateral and left anterior lower extremity in the setting WILMOTH, MANANSALA ([id number]) [id number] Page 12 of 20 of chronic venous insufficiency. More recently he was here earlier this year with predominantly an area on his left foot first metatarsal head plantar and he says the plantar foot broke down on its not long after we discharged him but he did not come back here. The last few months areas of broken down on his left anterior and again the left lateral lower extremity. The leg itself is very swollen chronically enlarged a lot of hyperkeratotic dry Berry Q skin in the left lower leg. His edema extends well into the thigh. He was seen by Dr. Donzetta Matters. He had ABIs on 03/02/2021 showing an ABI on the right of 1 with a TBI of 0.72 his ABI in the left at 1.09 TBI of 0.99. Monophasic and biphasic waveforms on the right. On the left monophasic waveforms were noted he went on to have an angiogram on 03/27/2021 this showed the aortic aortic  and iliac segments were free of flow-limiting stenosis the left common femoral vein to  evaluate the left femoral to anterior tibial artery bypass was unobstructed the bypass was patent without any areas of stenosis. We discharged the patient in bilateral juxta lite stockings but very clearly that was not sufficient to control the swelling and maintain skin integrity. He is clearly going to need compression pumps. 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 This is fortunate because he is on his feet for long periods of time. 10/27; patient comes in with our intake nurse reporting copious amount of green drainage from the left anterior mid tibia the left dorsal foot and to a lesser extent the left medial mid tibia. We left the compression wrap on all week for the amount of edema in his left leg is quite a bit better. We use silver alginate as the primary dressing 11/3; edema control is good. Left anterior lower leg left medial lower leg and the plantar first metatarsal head. The left anterior lower leg required debridement. Deep tissue culture I did of this wound showed MRSA I put him on 10 days of doxycycline which she will start today. We have him in compression wraps. He has a security card and [employer] however he is retiring on November 15. We will need to then get him into a better offloading boot for the left foot perhaps a total contact cast 11/10; edema control is quite good. Left anterior and left medial lower leg wounds in the setting of chronic venous insufficiency and lymphedema. He also has a substantial area over the left plantar first metatarsal head. 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 He has significant hypergranulation on the left plantar foot wound. The patient is a diabetic but he does not have significant PAD 11/17; edema control is quite good. Left anterior and left medial lower leg wounds look better. The really  concerning area remains the area on the left plantar first metatarsal head. He has a rim of epithelialization. 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 Starting today with a forefoot off loader but . possibly a total contact cast. 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 Arrives with a very significant odor from I think all of the wounds on the left leg and the left foot. Because of this I did not put a total contact cast on him today but will could still consider this. His wife was having cataract surgery which is the reason he missed the appointment 12/6. I saw this man 5 days ago with a swelling below the popliteal fossa. I thought he actually might have a Baker's cyst however the DVT rule out study that we could arrange right away was negative the technician told me this was not a ruptured Baker's cyst. We attempted to get this aspirated by under ultrasound guidance in interventional radiology however all they did was an ultrasound however it shows an extensive fluid collection 62 x 8 x 9.4 in the left thigh and left calf. The patient states he thinks this started  8 days ago or so but he really is not complaining of any pain, fever or systemic symptoms. He has not ha 12/20; after some difficulty I managed to get the patient into see Dr. Donzetta Matters. Eventually he was taken into the hospital and had a drain put in the fluid collection below his left knee posteriorly extending into the posterior thigh. He still has the drain in place. Culture of this showed moderate staff aureus few Morganella and few Klebsiella he is now on doxycycline and ciprofloxacin as suggested by infectious disease he is on this for a month. The drain will remain in place until it stops draining 12/29; he  comes in today with the 1 wound on his left leg and the area on the left plantar first met head significantly smaller. Both look healthy. He still has the drain in the left leg. He says he has to change this daily. Follows up with Dr. Donzetta Matters on January 11. 06/29/2021; the wounds that I am following on the left leg and left first met head continued to be quite healthy. However the area where his inferior drain is in place had copious amounts of drainage which was green in color. The wound here is larger. Follows up with Dr. Gwenlyn Saran of vein and vascular his surgeon next week as well as infectious disease. He remains on ciprofloxacin and doxycycline. He is not complaining of excessive pain in either one of the drain areas 1/12; the patient saw vascular surgery and infectious disease. Vascular surgery has left the drain in place as there was still some notable drainage still see him back in 2 weeks. Dr. Velna Ochs stop the doxycycline and ciprofloxacin and I do not believe he follows up with them at this point. Culture I did last week showed both doxycycline resistant MRSA and Pseudomonas not sensitive to ciprofloxacin although only in rare titers 1/19; the patient's wound on the left anterior lower leg is just about healed. We have continued healing of the area that was medially on the left leg. Left first plantar metatarsal head continues to get smaller. The major problem here is his 2 drain sites 1 on the left upper calf and lateral thigh. There is purulent drainage still from the left lateral thigh. I gave him antibiotics last week but we still have recultured. He has the drain in the area I think this is eventually going to have to come out. I suspect there will be a connecting wound to heal here perhaps with improved VAc 1/26; the patient had his drain removed by vein and vascular on 1/25/. This was a large pocket of fluid in his left thigh that seem to tunnel into his left upper calf. 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 His mention his Penrose drain was removed today. He now has a tunneling wound on his left calf and left thigh. Both of these probe widely towards each other although I cannot really prove that they connect. Both wounds on his lower leg anteriorly are closed and his area over the first metatarsal head on his right foot continues to improve. We are using Hydrofera Blue here. He also saw infectious disease culture of the abscess they noted was polymicrobial with MRSA, Morganella and Klebsiella he was treated with doxycycline and ciprofloxacin for 4 weeks ending on 07/03/2021. They did not recommend any further antibiotics. Notable that while he still had the Penrose drain in place last week he had purulent drainage coming out of the  inferior IandD site this grew [facility], MRSA and Pseudomonas but there does not appear to be any active infection in this area today with the drain out and he is not systemically unwell 2/2; with regards to the drain sites the superior one on the thigh actually is closed down the one on the upper left lateral calf measures about 8 and half centimeters which is an improvement seems to be less prominent although still with a lot of drainage. The only remaining wound is over the first metatarsal head on the left foot and this looks to be continuing to improve with Hydrofera Blue. 2/9; the area on his plantar left foot continues to contract. Callus around the wound edge. The drain sites specifically have not come down in depth. We put the wound VAC on Monday he changed the canister late last night our intake nurse reported a pocket of fluid perhaps caused by our compression wraps 2/16; continued improvement in left foot plantar wound. drainage site in the calf is not improved in terms of depth (wound vac) 2/23; continued improvement in the left foot wound over the first metatarsal head. With regards to the drain sites the area on his thigh  laterally is healed however the open area on his calf is small in terms of circumference by still probes in by about 15 cm. Within using the wound VAC. Hydrofera Blue on his foot 08/24/2021: The left first metatarsal head wound continues to improve. The wound bed is healthy with just some surrounding callus. Unfortunately the open drain site on his calf remains open and tunnels at least 15 cm (the extent of a Q-tip). This is despite several weeks of wound VAC treatment. Based on reading back through the notes, there has been really no significant change in the depth of the wound, although the orifice is smaller and the more cranial wound on his thigh has closed. I suspect the tunnel tracks nearly all the way to this location. [name], [name] ([id number]) [id number] Page 13 of 20 08/31/2021: Continued improvement in the left first metatarsal head wound. There has been absolutely no improvement to the long tunnel from his open drain site on his calf. We have tried to get him into see vascular surgery sooner to consider the possibility of simply filleting the tract open and allowing it to heal from the bottom up, likely with a wound VAC. They have not yet scheduled a sooner appointment than his current mid April 09/14/2021: He was seen by vascular surgery and they took him to the operating room last week. They opened a portion of the tunnel, but did not extend the entire length of the known open subcutaneous tract. I read Dr. Claretha Cooper operative note and it is not clear from that documentation why only a portion of the tract was opened. The heaped up granulation tissue was curetted and removed from at least some portion of the tract. They did place a wound VAC and applied an Unna boot to the leg. The ulcer on his left first metatarsal head is smaller today. The bed looks good and there is just a small amount of surrounding callus. 09/21/2021: The ulcer on his left first metatarsal head  looks to be stalled. There is some callus surrounding the wound but the wound bed itself does not appear particularly dynamic. The tunnel tract on his lateral left leg seems to be roughly the same length or perhaps slightly smaller but the wound bed appears healthy with good granulation tissue. He  opened up a new wound on his medial thigh and the site of a prior surgical incision. He says that he did this unconsciously in his sleep by scratching. 09/28/2021: Unfortunately, the ulcer on his left first metatarsal head has extended underneath the callus toward the dorsum of his foot. The medial thigh wounds are roughly the same. The tunnel on his lateral left leg continues to be problematic; it is longer than we are able to actually probe with a Q-tip. I am still not certain as to why Dr. Donzetta Matters did not open this up entirely when he took the patient to the operating room. We will likely be back in the same situation with just a small superficial opening in a long unhealed tract, as the open portion is granulating in nicely. 10/02/2021: The patient was initially scheduled for a nurse visit, but we are also applying a total contact cast today. The plantar foot wound looks clean without significant accumulated callus. We have been applying Prisma silver collagen to the site. 10/05/2021: The patient is here for his first total contact cast change. We have tried using gauze packing strips in the tunnel on his lateral leg wound, but this does not seem to be working any better than the white VAC foam. The foot ulcer looks about the same with minimal periwound callus. Medial thigh wound is clean with just some overlying eschar. 10/12/2021: The plantar foot wound is stable without any significant accumulation of periwound callus. The surface is viable with good granulation tissue. The medial thigh wounds are much smaller and are epithelializing. On the other hand, he had purulent drainage coming from the tunnel on his  lateral leg. He does go back to see Dr. Donzetta Matters next week and is planning to ask him why the wound tunnel was not completely opened at the time of his most recent operation. 10/19/2021: The plantar foot wound is markedly improved and has epithelial tissue coming through the surface. The medial thigh wounds are nearly closed with just a tiny open area. He did see Dr. Donzetta Matters earlier this week and apparently they did discuss the possibility of opening the sinus tract further and enabling a wound VAC application. Apparently there are some limits as to what Dr. 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 I think if we could get the tract open to the level of the popliteal fossa, this would greatly aid in her ability to get this chart closed. That being said, however, today when I probed the tract with a Q-tip, I was not able to insert the entirety of the Q-tip as I have on previous occasions. The tunnel is shorter by about 4 cm. The surface is clean with good granulation tissue and no further episodes of purulent drainage. 10/30/2021: Last week, the patient underwent surgery and had the long tract in his leg opened. There was a rind that was debrided, according to the operative report. His medial thigh ulcers are closed. The plantar foot wound is clean with a good surface and some built up surrounding callus. 11/06/2021: The overall dimensions of the large wound on his lateral leg remain about the same, but there is good granulation tissue present and the tunneling is a little bit shorter. He has a new wound on his anterior tibial surface, in the same location where he had a similar lesion in the past. The plantar foot wound is clean with some buildup surrounding callus. Just toward the medial aspect of his foot, however,  there is an area of darkening that once debrided, revealed another opening in the skin surface. 11/13/2021: The anterior tibial surface wound is closed. The  plantar foot wound has some surrounding callus buildup. The area of darkening that I debrided last week and revealed an opening in the skin surface has closed again. The tunnel in the large wound on his lateral leg has come in by about 3 cm. There is healthy granulation tissue on the entire wound surface. 11/23/2021: The patient was out of town last week and did wet-to-dry dressings on his large wound. 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 Unfortunately, he picked open the wound on his left medial thigh. He says that it was itching and he just could not stop scratching it until it was open again. The wound on his plantar foot is smaller and has not accumulated a tremendous amount of callus. The lateral leg wound is shallower and the tunnel has also decreased in depth. There is just a little bit of slough accumulation on the surface. 11/30/2021: Another portion of his left medial thigh has been opened up. All of these wounds are fairly superficial with just a little bit of slough and eschar accumulation. The wound on his plantar foot is almost closed with just a bit of eschar and periwound callus accumulation. The lateral leg wound is nearly flush with the surrounding skin and the tunnel is markedly shallower. 12/07/2021: There is just 1 open area on his left medial thigh. It is clean with just a little bit of perimeter eschar. The wound on his plantar foot continues to contract and just has some eschar and periwound callus accumulation. The lateral leg wound is closing at the more distal aspect and the tunnel is smaller. The surface is nearly flush with the surrounding skin and it has a good bed of granulation tissue. 12/14/2021: The thigh and foot wounds are closed. The lateral leg wound has closed over approximately half of its length. The tunnel continues to contract and the surface is now flush with the surrounding skin. The wound  bed has robust granulation tissue. 12/22/2021: The thigh and foot wounds have reopened. The foot wound has a lot of callus accumulation around and over it. The thigh wound is tiny with just a little bit of slough in the wound bed. The lateral leg wound continues to contract. His vascular surgeon took the wound VAC off earlier in the week and the patient has been doing wet-to-dry dressings. There is a little slough accumulation on the surface. The tunnel is about 3 cm in depth at this point. 12/28/2021: The thigh wound is closed again. The foot wound has some callus that subsequently has peeled back exposing just a small slit of a wound. The lateral leg wound Is down to about half the size that it originally was and the tunnel is down to about half a centimeter in depth. 01/04/2022: The thigh wound remains closed. The foot wound has heavy callus overlying the wound site. Once this was debrided, the wound was found to be closed. The lateral leg wound is smaller again this week and very superficial. No tunnel could be identified. 01/12/2022: The thigh and foot wounds both remain closed. The lateral leg wound is now nearly flush with the skin surface. There is good granulation tissue present with a light layer of slough. 01/19/2022: Due to the way his wrap was placed, the patient did not  change the dressing on his thigh at all and so the foam was saturated and his skin is macerated. There is a light layer of slough on the wound surface. The underlying granulation tissue is robust and healthy-appearing. He has heavy callus buildup at the site of his first metatarsal head wound which is still healed. 02/01/2022: He has been in silver alginate. When he removed the dressing from his thigh wound, however, some leg, superficially reopening a portion of the wound that had healed. In addition, underneath the callus at his left first metatarsal head, there appears to be a blister and the wound appears to be open  again. 02/08/2022: The lateral leg wound has contracted substantially. There is eschar and a light layer of slough present. He says that it is starting to pull and is uncomfortable. On inspection, there is some puckering of the scar and the eschar is quite dry; this may account for his symptoms. On his first metatarsal head, the wound is much smaller with just some eschar on the surface. The callus has not reaccumulated. He reports that he had a blister come up on his medial thigh wound at the distal aspect. It popped and there is now an opening in his skin again. Looking back through his [place] of wound photos, there is what looks like a permanent suture just deep to this location and it may be trying to erode through. We have been using silver alginate on his wounds. [name], [name] ([id number]) [id number] Page 14 of 20 02/15/2022: The lateral leg wound is about half the size it was last week. It is clean with just a little perimeter eschar and light slough. The wound on his first metatarsal head is about the same with heavy callus overlying it. The medial thigh wound is closed again. He does have some skin changes on the top of his foot that looks potentially yeast related. 02/22/2022: The skin on the top of his foot improved with the use of a topical antifungal. The lateral leg wound continues to contract and is again smaller this week. There is a little bit of slough and eschar on the surface. The first metatarsal head wound is a little bit smaller but has reaccumulated a thick callus over the top. He decided to try to trim his toenail and ultimately took the entire nail off of his left great toe. 03/02/2022: His lateral leg wound continues to improve, as does the wound on his left great toe. Unfortunately, it appears that somehow his foot got wet and moisture seeped in through the opening causing his skin to lift. There is a large wound now overlying his first metatarsal on  both the plantar, medial, and dorsal portion of his foot. There is necrotic tissue and slough present underneath the shaggy macerated skin. 03/08/2022: The lateral leg wound is smaller again today. There is just a light layer of slough and eschar on the surface. The great toe wound is smaller again today. The first metatarsal wound is a little bit smaller today and does not look nearly as necrotic and macerated. There is still slough and nonviable tissue present. 03/15/2022: The lateral leg wound is narrower and just has a little bit of light slough buildup. The first metatarsal wound still has a fair amount of moisture affecting the periwound skin. The great toe wound is healed. 03/22/2022: The lateral leg wound is now isolated to just at the level of his knee. There is some eschar and slough accumulation. The first metatarsal  head wound has epithelialized tremendously and is about half the size that it was last week. He still has some maceration on the top of his foot and a fungal odor is present. 03/29/2022: T oday the patient's foot was macerated, suggesting that the cast got wet. The patient has also been picking at his dry skin and has enlarged the wound on his left lateral leg. In the time between having his cast removed and my evaluation, he had picked more dry skin and opened up additional wounds on his Achilles area and dorsal foot. The plantar first metatarsal head wound, however, is smaller and clean with just macerated callus around the perimeter and light slough on the surface. The lateral leg wound measured a little bit larger but is also fairly clean with eschar and minimal slough. 04/02/2022: The patient had vascular studies done last Friday and so his cast was not applied. He is here today to have that done. 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 04/05/2022: Both wounds are smaller and quite clean. There is just a little biofilm on the lateral leg wound. 10/20; the  patient has a wound on the left lateral surgical incision at the level of his lateral knee this looks clean and improved. He is using silver alginate. He also has an area on his left medial foot for which she is using Hydrofera Blue under a total contact cast both wounds are measuring smaller 04/20/2022: The plantar foot wound has contracted considerably and is very close to closing. The lateral leg wound was measured a little larger, but there was a tiny open area that was included in the measurements that was not included last week. He has some eschar around the perimeter but otherwise the wound looks clean. 04/27/2022: The lateral leg wound looks better this week. He says that midweek, he felt it was very dry and began applying hydrogel to the site. I think this was beneficial. The foot wound is nearly closed underneath a thick layer of dry skin and callus. 05/04/2022: The foot wound is healed. He has developed a new small ulcer on his anterior tibial surface about midway up his leg. It has a little slough on the surface. The lateral leg wound still is fairly dry, but clean with just a little biofilm on the surface. 05/11/2022: The wound on his foot reopened on Wednesday. A large blister formed which then broke open revealing the fat layer underneath. The ulcer on his anterior tibial surface is a little bit larger this week. The lateral leg wound has much better moisture balance this week. Fortunately, prior to his foot wound reopening, he did get the cast made for his orthotic. 05/15/2022: Already, the left medial foot wound has improved. The tissue is less macerated and the surface is clean. The ulcer on his anterior tibial surface continues to enlarge. This seems likely secondary to accumulated moisture. The lateral leg wound continues to have an improved moisture balance with the use of collagen. 05/25/2022: The medial foot wound continues to contract. It is now substantially smaller with just a  little slough on the surface. The anterior tibial surface wound continues to enlarge further. Once again, this seems to be secondary to moisture. The lateral leg wound does not seem to be changing much in size, but the moisture balance is better. 06/01/2022: The anterior tibial wound is closed. The medial foot wound is down to just a very small, couple of millimeters, opening. The lateral leg wound has good  moisture balance, but remains unchanged in size. 12/15; the patient's anterior tibial wound has reopened, however the area on his right first metatarsal head is closed. The major wound is actually on the superior part of his surgical wound in the left lateral thigh. Not a completely viable surface under illumination. This may at some point require a debridement I think he is currently using Prisma. As noted the left medial foot wound has closed 06/14/2022: The anterior tibial wound has closed. The lateral leg wound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

## 2022-09-27 ENCOUNTER — Encounter (HOSPITAL_BASED_OUTPATIENT_CLINIC_OR_DEPARTMENT_OTHER): Payer: Medicare Other | Attending: General Surgery | Admitting: General Surgery

## 2022-09-27 DIAGNOSIS — M1990 Unspecified osteoarthritis, unspecified site: Secondary | ICD-10-CM | POA: Insufficient documentation

## 2022-09-27 DIAGNOSIS — N183 Chronic kidney disease, stage 3 unspecified: Secondary | ICD-10-CM | POA: Diagnosis not present

## 2022-09-27 DIAGNOSIS — E1122 Type 2 diabetes mellitus with diabetic chronic kidney disease: Secondary | ICD-10-CM | POA: Insufficient documentation

## 2022-09-27 DIAGNOSIS — I129 Hypertensive chronic kidney disease with stage 1 through stage 4 chronic kidney disease, or unspecified chronic kidney disease: Secondary | ICD-10-CM | POA: Insufficient documentation

## 2022-09-27 DIAGNOSIS — Z794 Long term (current) use of insulin: Secondary | ICD-10-CM | POA: Insufficient documentation

## 2022-09-27 DIAGNOSIS — L97528 Non-pressure chronic ulcer of other part of left foot with other specified severity: Secondary | ICD-10-CM | POA: Insufficient documentation

## 2022-09-27 DIAGNOSIS — I872 Venous insufficiency (chronic) (peripheral): Secondary | ICD-10-CM | POA: Insufficient documentation

## 2022-09-27 DIAGNOSIS — E1151 Type 2 diabetes mellitus with diabetic peripheral angiopathy without gangrene: Secondary | ICD-10-CM | POA: Diagnosis not present

## 2022-09-27 DIAGNOSIS — I890 Lymphedema, not elsewhere classified: Secondary | ICD-10-CM | POA: Diagnosis not present

## 2022-09-27 DIAGNOSIS — E11621 Type 2 diabetes mellitus with foot ulcer: Secondary | ICD-10-CM | POA: Diagnosis not present

## 2022-09-27 DIAGNOSIS — L97828 Non-pressure chronic ulcer of other part of left lower leg with other specified severity: Secondary | ICD-10-CM | POA: Diagnosis not present

## 2022-09-27 DIAGNOSIS — M109 Gout, unspecified: Secondary | ICD-10-CM | POA: Diagnosis not present

## 2022-09-27 DIAGNOSIS — E1140 Type 2 diabetes mellitus with diabetic neuropathy, unspecified: Secondary | ICD-10-CM | POA: Diagnosis not present

## 2022-09-27 NOTE — Progress Notes (Signed)
RUTVIK, REUTER ([id number]) [id number] Page 1 of 9 Visit Report for 09/27/2022 Arrival Information Details Patient Name: Date of Service: [name], [name] 09/27/2022 10:00 A M Medical Record Number: [id number] Patient Account Number: [id number] Date of Birth/Sex: Treating RN: [birth year] (72 y.o. M) Primary Care Fawaz Borquez: [name] Other Clinician: Referring Sherrilynn Gudgel: Treating Naviah Belfield/Extender: [name] in Treatment: 53 Visit Information History Since Last Visit All ordered tests and consults were completed: No Patient Arrived: Ambulatory Added or deleted any medications: No Arrival Time: 10:06 Any new allergies or adverse reactions: No Accompanied By: self Had a fall or experienced change in No Transfer Assistance: None activities of daily living that may affect Patient Identification Verified: Yes risk of falls: Secondary Verification Process Completed: Yes Signs or symptoms of abuse/neglect since last visito No Patient Requires Transmission-Based Precautions: No Hospitalized since last visit: No Patient Has Alerts: Yes Implantable device outside of the clinic excluding No cellular tissue based products placed in the center since last visit: Pain Present Now: No Electronic Signature(s) Signed: 09/27/2022 11:16:23 AM By: Worthy Rancher Entered By: Worthy Rancher on 09/27/2022 10:07:27 -------------------------------------------------------------------------------- Compression Therapy Details Patient Name: Date of Service: [name]. 09/27/2022 10:00 A M Medical Record Number: [id number] Patient Account Number: [id number] Date of Birth/Sex: Treating RN: [birth year] (72 y.o. [name] Primary Care Damin Salido: [name] Other Clinician: Referring Dray Dente: Treating Kosta Schnitzler/Extender: [name] in [id number] Compression Therapy Performed for Wound Assessment: Wound #18R Left,Plantar  Metatarsal head first Performed By: Clinician Adline Peals, RN Compression Type: Double Layer Post Procedure Diagnosis Same as Pre-procedure Electronic Signature(s) Signed: 09/27/2022 1:04:54 PM By: Sabas Sous By: Adline Peals on 09/27/2022 10:47:20 -------------------------------------------------------------------------------- Encounter Discharge Information Details Patient Name: Date of Service: [name]. 09/27/2022 10:00 A M Medical Record Number: [id number] Patient Account Number: [id number] Date of Birth/Sex: Treating RN: 16-Jan-1951 (72 y.o. [name] Primary Care Demarius Archila: [name] Other Clinician: Referring Justis Closser: Treating Cheridan Kibler/Extender: [name] in Treatment: 77 Encounter Discharge Information Items Post Procedure Vitals Discharge Condition: Stable Temperature (F): 97.8 Ambulatory Status: Ambulatory Pulse (bpm): 61 Discharge Destination: Home Respiratory Rate (breaths/min): 20 Transportation: Private Auto Blood Pressure (mmHg): 161/91 Accompanied By: self [name] ([id number]) [id number] Page 2 of 9 Schedule Follow-up Appointment: Yes Clinical Summary of Care: Patient Declined Electronic Signature(s) Signed: 09/27/2022 1:04:54 PM By: Adline Peals Entered By: Adline Peals on 09/27/2022 10:50:24 -------------------------------------------------------------------------------- Lower Extremity Assessment Details Patient Name: Date of Service: [name]. 09/27/2022 10:00 A M Medical Record Number: [id number] Patient Account Number: [id number] Date of Birth/Sex: Treating RN: 1950-08-06 (72 y.o. [name] Primary Care Stormie Ventola: [name] Other Clinician: Referring Braelynne Garinger: Treating Sameer Teeple/Extender: [name] in Treatment: 76 Edema Assessment Assessed: [name]: Yes] [name]: No] Edema: [Left: Ye] [Right:  s] Calf Left: Right: Point of Measurement: 41 cm From Medial Instep 46.5 cm Ankle Left: Right: Point of Measurement: 10 cm From Medial Instep 26.2 cm Vascular Assessment Pulses: Dorsalis Pedis Palpable: [Left:Yes] Electronic Signature(s) Signed: 09/27/2022 1:04:54 PM By: Adline Peals Entered By: Adline Peals on 09/27/2022 10:14:12 -------------------------------------------------------------------------------- Multi Wound Chart Details Patient Name: Date of Service: [name]. 09/27/2022 10:00 A M Medical Record Number: [id number] Patient Account Number: [id number] Date of Birth/Sex: Treating RN: 06-08-1951 (72 y.o. M) Primary Care Burnice Oestreicher: [name] Other Clinician: Referring Nyrie Sigal: Treating Bellamia Ferch/Extender: [name] in Treatment: 76 Vital Signs Height(in): 74 Capillary Blood Glucose(mg/dl): 134  Weight(lbs): 238 Pulse(bpm): 61 Body Mass Index(BMI): 30.6 Blood Pressure(mmHg): 161/91 Temperature(F): 97.8 Respiratory Rate(breaths/min): 20 [18R:Photos:] [N/A:N/A [phone].pdf Page 3 of 9] Left, Plantar Metatarsal head first Left, Proximal, Lateral Lower Leg N/A Wound Location: Gradually Appeared Bump N/A Wounding Event: Diabetic Wound/Ulcer of the Lower Cyst N/A Primary Etiology: Extremity Glaucoma, Sleep Apnea, Glaucoma, Sleep Apnea, N/A Comorbid History: Hypertension, Peripheral Arterial Hypertension, Peripheral Arterial Disease, Peripheral Venous Disease, Disease, Peripheral Venous Disease, Type II Diabetes, Gout, Osteoarthritis, Type II Diabetes, Gout, Osteoarthritis, Neuropathy Neuropathy 08/23/2020 06/03/2021 N/A Date Acquired: 76 68 N/A Weeks of Treatment: Open Open N/A Wound Status: Yes No N/A Wound Recurrence: No Yes N/A Clustered Wound: N/A 3 N/A Clustered Quantity: 1x2.1x0.1 3.4x1x0.1 N/A Measurements L x W x D (cm) 1.649 2.67 N/A A (cm) : rea 0.165 0.267 N/A Volume  (cm) : 88.30% -61.90% N/A % Reduction in A rea: 94.20% 79.80% N/A % Reduction in Volume: Grade 2 Full Thickness With Exposed Support N/A Classification: Structures Medium Medium N/A Exudate A mount: Serosanguineous Serosanguineous N/A Exudate Type: red, brown red, brown N/A Exudate Color: Flat and Intact Fibrotic scar, thickened scar N/A Wound Margin: Large (67-100%) Large (67-100%) N/A Granulation A mount: Red Red, Pink N/A Granulation Quality: Small (1-33%) Small (1-33%) N/A Necrotic A mount: Fat Layer (Subcutaneous Tissue): Yes Fat Layer (Subcutaneous Tissue): Yes N/A Exposed Structures: Fascia: No Fascia: No Tendon: No Tendon: No Muscle: No Muscle: No Joint: No Joint: No Bone: No Bone: No Small (1-33%) Small (1-33%) N/A Epithelialization: Debridement - Selective/Open Wound N/A N/A Debridement: Pre-procedure Verification/Time Out 10:23 N/A N/A Taken: Lidocaine 4% T opical Solution N/A N/A Pain Control: Callus, Slough N/A N/A Tissue Debrided: Non-Viable Tissue N/A N/A Level: 2.1 N/A N/A Debridement A (sq cm): rea Curette N/A N/A Instrument: Minimum N/A N/A Bleeding: Pressure N/A N/A Hemostasis A chieved: Procedure was tolerated well N/A N/A Debridement Treatment Response: 1x2.1x0.1 N/A N/A Post Debridement Measurements L x W x D (cm) 0.165 N/A N/A Post Debridement Volume: (cm) Callus: Yes Scarring: Yes N/A Periwound Skin Texture: Maceration: No Dry/Scaly: Yes N/A Periwound Skin Moisture: Dry/Scaly: No No Abnormalities Noted Hemosiderin Staining: Yes N/A Periwound Skin Color: No Abnormality No Abnormality N/A Temperature: Compression Therapy N/A N/A Procedures Performed: Debridement Treatment Notes Wound #18R (Metatarsal head first) Wound Laterality: Plantar, Left Cleanser Soap and Water Discharge Instruction: May shower and wash wound with dial antibacterial soap and water prior to dressing change. Wound Cleanser Discharge  Instruction: Cleanse the wound with wound cleanser prior to applying a clean dressing using gauze sponges, not tissue or cotton balls. Peri-Wound Care Topical Primary Dressing Sorbalgon AG Dressing 2x2 (in/in) Discharge Instruction: Apply to wound bed as instructed Secondary Dressing Optifoam Non-Adhesive Dressing, 4x4 in Discharge Instruction: Apply over primary dressing as directed. Zetuvit Plus 4x8 in Discharge Instruction: Apply over primary dressing as directed. AMMON, FORESTIER ([id number]) [id number] Page 4 of 9 Secured With Compression Wrap CoFlex [employer]inc [employer], 4 x 6 (in/yd) Discharge Instruction: Apply Coflex [employer]inc [employer] as directed. Compression Stockings Add-Ons Wound #22 (Lower Leg) Wound Laterality: Left, Lateral, Proximal Cleanser Soap and Water Discharge Instruction: May shower and wash wound with dial antibacterial soap and water prior to dressing change. Wound Cleanser Discharge Instruction: Cleanse the wound with wound cleanser prior to applying a clean dressing using gauze sponges, not tissue or cotton balls. Peri-Wound Care Sween Lotion (Moisturizing lotion) Discharge Instruction: Apply moisturizing lotion to the leg Topical Skintegrity Hydrogel 4 (oz) Discharge Instruction: Apply hydrogel as directed Primary Dressing Endoform 2x2 in  Discharge Instruction: Moisten with Hydrogel or saline Secondary Dressing ABD Pad, 8x10 Discharge Instruction: Apply over primary dressing as directed. Secured With Elastic Bandage 4 inch (ACE bandage) Discharge Instruction: Secure with ACE bandage as directed. Compression Wrap Compression Stockings Add-Ons Electronic Signature(s) Signed: 09/27/2022 11:05:06 AM By: Fredirick Maudlin MD FACS Entered By: Fredirick Maudlin on 09/27/2022 11:05:06 -------------------------------------------------------------------------------- Multi-Disciplinary Care Plan Details Patient Name: Date of Service: [name]. 09/27/2022 10:00 A M Medical Record Number: [id number] Patient Account Number: [id number] Date of Birth/Sex: Treating RN: May 29, 1951 (72 y.o. [name] Primary Care Lucia Mccreadie: [name] Other Clinician: Referring Ashaun Gaughan: Treating Jaklyn Alen/Extender: [name] in Treatment: 83 [place] reviewed with physician Active Inactive Venous Leg Ulcer Nursing Diagnoses: Knowledge deficit related to disease process and management Potential for venous Insuffiency (use before diagnosis confirmed) GoalsHARITH, EHRLER ([id number]) [id number] Page 5 of 9 Patient will maintain optimal edema control Date Initiated: 07/27/2021 Target Resolution Date: 02/23/2023 Goal Status: Active Interventions: Assess peripheral edema status every visit. Treatment Activities: Therapeutic compression applied : 07/27/2021 Notes: Wound/Skin Impairment Nursing Diagnoses: Impaired tissue integrity Knowledge deficit related to ulceration/compromised skin integrity Goals: Patient will have a decrease in wound volume by X% from date: (specify in notes) Date Initiated: 04/12/2021 Date Inactivated: 01/04/2022 Target Resolution Date: 04/23/2021 Goal Status: Met Patient/caregiver will verbalize understanding of skin care regimen Date Initiated: 01/04/2022 Target Resolution Date: 02/23/2023 Goal Status: Active Ulcer/skin breakdown will have a volume reduction of 30% by week 4 Date Initiated: 04/12/2021 Date Inactivated: 04/27/2021 Target Resolution Date: 04/27/2021 Goal Status: Unmet Unmet Reason: infection Ulcer/skin breakdown will have a volume reduction of 50% by week 8 Date Initiated: 04/27/2021 Date Inactivated: 06/29/2021 Target Resolution Date: 06/24/2021 Goal Status: Met Interventions: Assess patient/caregiver ability to obtain necessary supplies Assess patient/caregiver ability to perform ulcer/skin care regimen upon  admission and as needed Assess ulceration(s) every visit Notes: Electronic Signature(s) Signed: 09/27/2022 1:04:54 PM By: Adline Peals Entered By: Adline Peals on 09/27/2022 10:17:38 -------------------------------------------------------------------------------- Pain Assessment Details Patient Name: Date of Service: [name]. 09/27/2022 10:00 A M Medical Record Number: [id number] Patient Account Number: [id number] Date of Birth/Sex: Treating RN: 1951/05/03 (72 y.o. M) Primary Care Kenijah Benning[place]: [name] Other Clinician: Referring Krrish Freund: Treating Terrie Haring/Extender: [name] in Treatment: 31 Active Problems Location of Pain Severity and Description of Pain Patient Has Paino No Site Locations MANSOUR, LOGGINS ([id number]) [phone].pdf Page 6 of 9 Pain Management and Medication Current Pain Management: Electronic Signature(s) Signed: 09/27/2022 11:16:23 AM By: Worthy Rancher Entered By: Worthy Rancher on 09/27/2022 10:08:26 -------------------------------------------------------------------------------- Patient/Caregiver Education Details Patient Name: Date of Service: [place], [name] 4/4/2024andnbsp10:00 A M Medical Record Number: [id number] Patient Account Number: [id number] Date of Birth/Gender: Treating RN: [birth year] (72 y.o. [name] Primary Care Physician: [name] Other Clinician: Referring Physician: Treating Physician/Extender: [name] in Treatment: 70 Education Assessment Education Provided To: Patient Education Topics Provided Wound/Skin Impairment: Methods: Explain/Verbal Responses: Reinforcements needed, State content correctly Electronic Signature(s) Signed: 09/27/2022 1:04:54 PM By: Adline Peals Entered By: Adline Peals on 09/27/2022 10:17:52 -------------------------------------------------------------------------------- Wound Assessment  Details Patient Name: Date of Service: [name]. 09/27/2022 10:00 A M Medical Record Number: [id number] Patient Account Number: [id number] Date of Birth/Sex: Treating RN: 26-Apr-1951 (72 y.o. [name] Primary Care Tavien Chestnut: [name] Other Clinician: Referring Madden Garron: Treating Smriti Barkow/Extender: [name] in Treatment: 76 Wound Status Wound Number: 18R Primary Diabetic Wound/Ulcer of the Lower  Extremity Etiology: Wound Location: Left, Plantar Metatarsal head first Wound Open Wounding Event: Gradually Appeared Status: Date Acquired: 08/23/2020 Comorbid Glaucoma, Sleep Apnea, Hypertension, Peripheral Arterial Disease, Weeks Of Treatment: 76 History: Peripheral Venous Disease, Type II Diabetes, Gout, Osteoarthritis, Clustered Wound: No Neuropathy EDIBERTO, FARRELL ([id number]) [id number] Page 7 of 9 Photos Wound Measurements Length: (cm) 1 Width: (cm) 2.1 Depth: (cm) 0.1 Area: (cm) 1.649 Volume: (cm) 0.165 % Reduction in Area: 88.3% % Reduction in Volume: 94.2% Epithelialization: Small (1-33%) Wound Description Classification: Grade 2 Wound Margin: Flat and Intact Exudate Amount: Medium Exudate Type: Serosanguineous Exudate Color: red, brown Foul Odor After Cleansing: No Slough/Fibrino Yes Wound Bed Granulation Amount: Large (67-100%) Exposed Structure Granulation Quality: Red Fascia Exposed: No Necrotic Amount: Small (1-33%) Fat Layer (Subcutaneous Tissue) Exposed: Yes Necrotic Quality: Adherent Slough Tendon Exposed: No Muscle Exposed: No Joint Exposed: No Bone Exposed: No Periwound Skin Texture Texture Color No Abnormalities Noted: No No Abnormalities Noted: Yes Callus: Yes Temperature / Pain Temperature: No Abnormality Moisture No Abnormalities Noted: No Dry / Scaly: No Maceration: No Treatment Notes Wound #18R (Metatarsal head first) Wound Laterality: Plantar, Left Cleanser Soap  and Water Discharge Instruction: May shower and wash wound with dial antibacterial soap and water prior to dressing change. Wound Cleanser Discharge Instruction: Cleanse the wound with wound cleanser prior to applying a clean dressing using gauze sponges, not tissue or cotton balls. Peri-Wound Care Topical Primary Dressing Sorbalgon AG Dressing 2x2 (in/in) Discharge Instruction: Apply to wound bed as instructed Secondary Dressing Optifoam Non-Adhesive Dressing, 4x4 in Discharge Instruction: Apply over primary dressing as directed. Zetuvit Plus 4x8 in Discharge Instruction: Apply over primary dressing as directed. Secured With [name] ([id number]) [phone].pdf Page 8 of 9 Compression Wrap CoFlex Zinc [employer], 4 x 6 (in/yd) Discharge Instruction: Apply Coflex [employer]inc [employer] as directed. Compression Stockings Add-Ons Electronic Signature(s) Signed: 09/27/2022 11:16:23 AM By: Worthy Rancher Signed: 09/27/2022 1:04:54 PM By: Adline Peals Entered By: Worthy Rancher on 09/27/2022 10:15:54 -------------------------------------------------------------------------------- Wound Assessment Details Patient Name: Date of Service: [name]. 09/27/2022 10:00 A M Medical Record Number: [id number] Patient Account Number: [id number] Date of Birth/Sex: Treating RN: January 24, 1951 (72 y.o. [name] Primary Care Abdirahim Flavell: [name] Other Clinician: Referring Emerick Weatherly: Treating Berk Pilot/Extender: [name] in Treatment: 76 Wound Status Wound Number: 22 Primary Cyst Etiology: Wound Location: Left, Proximal, Lateral Lower Leg Wound Open Wounding Event: Bump Status: Date Acquired: 06/03/2021 Comorbid Glaucoma, Sleep Apnea, Hypertension, Peripheral Arterial Disease, Weeks Of Treatment: 68 History: Peripheral Venous Disease, Type II Diabetes, Gout, Osteoarthritis, Clustered Wound: Yes Neuropathy Photos Wound  Measurements Length: (cm) Width: (cm) Depth: (cm) Clustered Quantity: Area: (cm) Volume: (cm) 3.4 % Reduction in Area: -61.9% 1 % Reduction in Volume: 79.8% 0.1 Epithelialization: Small (1-33%) 3 Tunneling: No 2.67 Undermining: No 0.267 Wound Description Classification: Full Thickness With Exposed Suppo Wound Margin: Fibrotic scar, thickened scar Exudate Amount: Medium Exudate Type: Serosanguineous Exudate Color: red, brown rt Structures Foul Odor After Cleansing: No Slough/Fibrino Yes Wound Bed Granulation Amount: Large (67-100%) Exposed Structure Granulation Quality: Red, Pink Fascia Exposed: No Necrotic Amount: Small (1-33%) Fat Layer (Subcutaneous Tissue) Exposed: Yes Necrotic Quality: Adherent Slough Tendon Exposed: No Muscle Exposed: No Joint Exposed: No Bone Exposed: No [address] ARMONTE, GIRAUD ([id number]) [id number] Page 9 of 9 No Abnormalities Noted: No No Abnormalities Noted: No Scarring: Yes Hemosiderin Staining: Yes Moisture Temperature / Pain No Abnormalities Noted: No Temperature: No Abnormality Dry / Scaly: Yes  Treatment Notes Wound #22 (Lower Leg) Wound Laterality: Left, Lateral, Proximal Cleanser Soap and Water Discharge Instruction: May shower and wash wound with dial antibacterial soap and water prior to dressing change. Wound Cleanser Discharge Instruction: Cleanse the wound with wound cleanser prior to applying a clean dressing using gauze sponges, not tissue or cotton balls. Peri-Wound Care Sween Lotion (Moisturizing lotion) Discharge Instruction: Apply moisturizing lotion to the leg Topical Skintegrity Hydrogel 4 (oz) Discharge Instruction: Apply hydrogel as directed Primary Dressing Endoform 2x2 in Discharge Instruction: Moisten with Hydrogel or saline Secondary Dressing ABD Pad, 8x10 Discharge Instruction: Apply over primary dressing as directed. Secured With Elastic Bandage  4 inch (ACE bandage) Discharge Instruction: Secure with ACE bandage as directed. Compression Wrap Compression Stockings Add-Ons Electronic Signature(s) Signed: 09/27/2022 11:16:23 AM By: Worthy Rancher Signed: 09/27/2022 1:04:54 PM By: Adline Peals Entered By: Worthy Rancher on 09/27/2022 10:16:28 -------------------------------------------------------------------------------- Vitals Details Patient Name: Date of Service: [name]. 09/27/2022 10:00 A M Medical Record Number: [id number] Patient Account Number: [id number] Date of Birth/Sex: Treating RN: Jun 24, 1951 (72 y.o. M) Primary Care Dyshaun Bonzo: [name] Other Clinician: Referring Colsen Modi: Treating Jayvien Rowlette/Extender: [name] in Treatment: 76 Vital Signs Time Taken: 10:07 Temperature (F): 97.8 Height (in): 74 Pulse (bpm): 61 Weight (lbs): 238 Respiratory Rate (breaths/min): 20 Body Mass Index (BMI): 30.6 Blood Pressure (mmHg): 161/91 Capillary Blood Glucose (mg/dl): 134 Reference Range: 80 - 120 mg / dl Electronic Signature(s) Signed: 09/27/2022 11:16:23 AM By: Worthy Rancher Entered By: Worthy Rancher on 09/27/2022 10:08:15

## 2022-09-27 NOTE — Progress Notes (Signed)
[name], [name] ([id number]) [id number] Page 1 of 19 Visit Report for 09/27/2022 Chief Complaint Document Details Patient Name: Date of Service: [name], [name] 09/27/2022 10:00 A M Medical Record Number: [id number] Patient Account Number: [id number] Date of Birth/Sex: Treating [name]: 31-May-1951 (72 y.o. M) Primary Care Provider: Jilda [name] Other Clinician: Referring Provider: Treating Provider/Extender: [name] in Treatment: 76 Information Obtained from: Patient Chief Complaint Left leg and foot ulcers 04/12/2021; patient is here for wounds on his left lower leg and left plantar foot over the first metatarsal head Electronic Signature(s) Signed: 09/27/2022 11:05:15 AM By: [name] Maudlin MD FACS Entered By: [name] on 09/27/2022 11:05:15 -------------------------------------------------------------------------------- Debridement Details Patient Name: Date of Service: [name]. 09/27/2022 10:00 A M Medical Record Number: [id number] Patient Account Number: [id number] Date of Birth/Sex: Treating [name]: 21-Feb-1951 (72 y.o. [name] Primary Care Provider: Jilda [name] Other Clinician: Referring Provider: Treating Provider/Extender: [name] in Treatment: 76 Debridement Performed for Assessment: Wound #18R Left,Plantar Metatarsal head first Performed By: Physician [name] Maudlin, MD Debridement Type: Debridement Severity of Tissue Pre Debridement: Fat layer exposed Level of Consciousness (Pre-procedure): Awake and Alert Pre-procedure Verification/Time Out Yes - 10:23 Taken: Start Time: 10:23 Pain Control: Lidocaine 4% T opical Solution T Area Debrided (L x W): otal 1 (cm) x 2.1 (cm) = 2.1 (cm) Tissue and other material debrided: Non-Viable, Callus, Slough, Slough Level: Non-Viable Tissue Debridement Description: Selective/Open Wound Instrument: Curette Bleeding:  Minimum Hemostasis Achieved: Pressure Response to Treatment: Procedure was tolerated well Level of Consciousness (Post- Awake and Alert procedure): Post Debridement Measurements of Total Wound Length: (cm) 1 Width: (cm) 2.1 Depth: (cm) 0.1 Volume: (cm) 0.165 Character of Wound/Ulcer Post Debridement: Improved Severity of Tissue Post Debridement: Fat layer exposed Post Procedure Diagnosis Same as Pre-procedure Notes scribed for Dr. Celine [name] by [name] Electronic Signature(s) Signed: 09/27/2022 12:44:03 PM By: [name] Maudlin MD FACS Signed: 09/27/2022 1:04:54 PM By: [name] ([id number]) [id number] Page 2 of 19 Entered By: [name] on 09/27/2022 10:26:41 -------------------------------------------------------------------------------- HPI Details Patient Name: Date of Service: [name], [name] 09/27/2022 10:00 A M Medical Record Number: [id number] Patient Account Number: [id number] Date of Birth/Sex: Treating [name]: Feb 13, 1951 (72 y.o. M) Primary Care Provider: Jilda [name] Other Clinician: Referring Provider: Treating Provider/Extender: [name] in Treatment: 24 History of Present Illness HPI Description: 10/11/17; [name] is a 72 year old man who tells me that in 2015 he slipped down the latter traumatizing his left leg. He developed a wound in the same spot the area that we are currently looking at. He states this closed over for the most part although he always felt it was somewhat unstable. In 2016 he hit the same area with the door of his car had this reopened. He tells me that this is never really closed although sometimes an inflow it remains open on a constant basis. He has not been using any specific dressing to this except for topical antibiotics the nature of which were not really sure. His primary doctor did send him to see Dr. Einar [name] of interventional cardiology. He underwent  an angiogram on 08/06/17 and he underwent a PTA and directional atherectomy of the lesser distal SFA and popliteal arteries which resulted in brisk improvement in blood flow. It was noted that he had 2 vessel runoff through the anterior tibial and peroneal. He is also been to see vascular and interventional radiologist. He was not felt to  have any significant superficial venous insufficiency. Presumably is not a candidate for any ablation. It was suggested he come here for wound care. The patient is a type II diabetic on insulin. He also has a history of venous insufficiency. ABIs on the left were noncompressible in our clinic 10/21/17; patient we admitted to the clinic last week. 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 We put Iodosorb on him after an aggressive debridement and 3 layer compression. He complained of pain in his ankle and itching with is skin in fact he scratched the area on the medial calf superiorly at the rim of our wraps and he has 2 small open areas in that location today which are new. I changed his primary dressing today to silver collagen. As noted he is already had revascularization and does not have any significant superficial venous insufficiency that would be amenable to ablation 10/28/17; patient admitted to the clinic 2 weeks ago. He has a smaller Wound. Scratch injury from last week revealed. There is large wound over the tibial area. This is smaller. Granulation looks healthy. No need for debridement. 11/04/17; the wound on the left lateral calf looks better. Improved dimensions. Surface of this looks better. We've been maintaining him and Kerlix Coban wraps. He finds this much more comfortable. Silver collagen dressing 11/11/17; left lateral Wound continues to look healthy be making progress. Using a #5 curet I removed removed nonviable skin from the surface of the wound and then necrotic debris from the  wound surface. Surface of the wound continues to look healthy. He also has an open area on the left great toenail bed. We've been using topical antibiotics. 11/19/17; left anterior lateral wound continues to look healthy but it's not closed. He also had a small wound above this on the left leg Initially traumatic wounds in the setting of significant chronic venous insufficiency and stasis dermatitis 11/25/17; left anterior wounds superiorly is closed still a small wound inferiorly. 12/02/17; left anterior tibial area. Arrives today with adherent callus. Post debridement clearly not completely closed. Hydrofera Blue under 3 layer compression. 12/09/17; left anterior tibia. Circumferential eschar however the wound bed looks stable to improved. We've been using Hydrofera Blue under 3 layer compression 12/17/17; left anterior tibia. Apparently this was felt to be closed however when the wrap was taken off there is a skin tear to reopen wounds in the same area we've been using Hydrofera Blue under 3 layer compression 12/23/17 left anterior tibia. Not close to close this week apparently the [facility] was stuck to this again. Still circumferential eschar requiring debridement. I put a contact layer on this this time under the Hydrofera Blue 12/31/17; left anterior tibia. Wound is better slight amount of hyper-granulation. Using Hydrofera Blue over Adaptic. 01/07/18; left anterior tibia. The wound had some surface eschar however after this was removed he has no open wound.he was already revascularized by Dr. Einar [name] when he came to our clinic with atherectomy of the left SFA and popliteal artery. He was also sent to interventional radiology for venous reflux studies. He was not felt to have significant reflux but certainly has chronic venous changes of his skin with hemosiderin deposition around this area. He will definitely need to lubricate his skin and wear compression stocking and I've talked to him about  this. READMISSION 05/26/2018 This is a now 72 year old man we cared for with traumatic wounds on his left anterior lower extremity. He  had been previously revascularized during that admission by Dr. Einar [name]. Apparently in follow-up Dr. Einar [name] noted that he had deterioration in his arterial status. He underwent a stent placement in the distal left SFA on 04/22/2018. 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 He went back to the angiography suite on 04/30/2018 he underwent PTA and balloon angioplasty of the occluded left mid anterior tibial artery, thrombotic occlusion went from 100 to 0% which reconstitutes the posterior tibial artery. He had thrombectomy and aspiration of the peroneal artery. The stent placed in the distal SFA left SFA was still occluded. 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 He has been recuperating at [facility] in [facility] after the most recent hospitalization. He is going home tomorrow. He tells me that 2 weeks ago he traumatized the tip of his left fifth toe. He came in urgently for our review of this. This was a history of before I noted that Dr. Einar [name] had already noted dry gangrenous changes of the left fifth toe 06/09/2018; 2-week follow-up. I did contact Dr. Einar [name] after his last appointment and he apparently saw 1 of Dr. Irven Shelling colleagues the next day. He does not follow-up with Dr. Einar [name] himself until Thursday of this week. 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 Nevertheless there is no evidence of infection no drainage and no pain. He had a new area that this week when we were signing him in today on the left anterior mid tibia area, this is in close proximity to the previous  wound we have dealt with in this clinic. 06/23/2018; 2-week follow-up. I did not receive a recent note from Dr. Einar [name] to review today. Our office is trying to obtain this. He is apparently not planning to do further vascular interventions and wondered about compression to try and help with the patient's chronic venous insufficiency. However we are also concerned about the arterial flow. He arrives in clinic today with a new area on the left third toe. The areas on the calf/anterior tibia are close to closing. The left fifth toe is still mummified using Betadine. -In reviewing things with the patient he has what sounds like claudication with mild to moderate amount of activity. 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 I prescribed Levaquin over the phone while we attempted to arrange a plan of care. However the patient called yesterday to report he had low-grade fever and he came in today acutely. There is been a marked deterioration in the left third toe with spreading cellulitis up into the dorsal left foot. He was referred to the emergency room. [name], [name] ([id number]) [id number] Page 3 of 19 Readmission: 06/29/2020 patient presents today for reevaluation here in our clinic he was previously treated by Dr. Dellia Nims at the latter part of 2019 in 2 the beginning of 2020. Subsequently we have not seen him since that time in the interim he did have evaluation with vein and vascular specialist specifically Dr. Anice Paganini who did perform quite extensive work for a left femoral to anterior tibial artery bypass. With that being said in the interim the patient has developed significant lymphedema and has wounds that he tells me  have really never healed in regard to the incision site on the left leg. He also has multiple wounds on the feet for various reasons some of which is that he tends to pick at his feet. Fortunately there is no signs of  active infection systemically at this time he does have some wounds that are little bit deeper but most are fairly superficial he seems to have good blood flow and overall everything appears to be healthy I see no bone exposed and no obvious signs of osteomyelitis. I do not know that he necessarily needs a x-ray at this point although that something we could consider depending on how things progress. The patient does have a history of lymphedema, diabetes, this is type II, chronic kidney disease stage III, hypertension, and history of peripheral vascular disease. 07/05/2020; patient admitted last week. Is a patient I remember from 2019 he had a spreading infection involving the left foot and we sent him to the hospital. 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 He subsequently had a left femoral to anterior tibial bypass by Dr.Cain vein and vascular. He also has severe lymphedema with chronic skin changes related to that on the left leg. The most problematic area that was new today was on the left medial great toe. This was apparently a small area last week there was purulent drainage which our intake nurse cultured. Also areas on the left medial foot and heel left lateral foot. He has 2 areas on the left medial calf left lateral calf in the setting of the severe lymphedema. 07/13/2020 on evaluation today patient appears to be doing better in my opinion compared to his last visit. The good news is there is no signs of active infection systemically and locally I do not see any signs of infection either. He did have an x-ray which was negative that is great news he had a culture which showed MRSA but at the same time he is been on the doxycycline which has helped. I do think we may want to extend this for 7 additional days 1/25; patient admitted to the clinic a few weeks ago. 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 We  have been putting him under compression his edema control is a lot better but he is severe verricused skin on the left leg. He is really done quite well he still has an open area on the left medial calf and the left medial first metatarsal head. We have been using silver collagen on the leg silver alginate on the foot 07/27/2020 upon evaluation today patient appears to be doing decently well in regard to his wounds. He still has a lot of dry skin on the left leg. Some of this is starting to peel back and I think he may be able to have them out by removing some that today. Fortunately there is no signs of active infection at this time on the left leg although on the right leg he does appear to have swelling and erythema as well as some mild warmth to touch. This does have been concerned about the possibility of cellulitis although within the differential diagnosis I do think that potentially a DVT has to be at least considered. We need to rule that out before proceeding would just call in the cellulitis. Especially since he is having pain in the posterior aspect of his calf muscle. 2/8; the patient had seen  sparingly. He has severe skin changes of chronic lymphedema in the left leg thickened hyperkeratotic verrucous skin. He has an open wound on the medial part of the left first met head left mid tibia. He also has a rim of nonepithelialized skin in the anterior mid tibia. He brought in the AmLactin lotion that was been prescribed although I am not sure under compression and its utility. There concern about cellulitis on the right lower leg the last time he was here. He was put on on antibiotics. His DVT rule out was negative. The right leg looks fine he is using his stocking on this area 08/10/2020 upon evaluation today patient appears to be doing well with regard to his leg currently. He has been tolerating the dressing changes without complication. Fortunately there is no signs of active infection which  is great news. Overall very pleased with where things stand. 2/22; the patient still has an area on the medial part of the left first met his head. This looks better than when I last saw this earlier this month he has a rim of epithelialization but still some surface debris. Mostly everything on the left leg is healed. There is still a vulnerable in the left mid tibia area. 08/30/2020 upon evaluation today patient appears to be doing much better in regard to his wounds on his foot. 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 Nonetheless I think we will address that today I Minna send in a prescription for him in that regard. Overall though there does not appear to be any signs of significant worsening. 09/07/2020 on evaluation today patient's wounds over his left foot appear to be doing excellent. I do not see any signs of infection there is some callus buildup this can require debridement for certain but overall I feel like he is managing quite nicely. He still using the AmLactin cream which has been beneficial for him as well. 3/22; left foot wound is closed. There is no open area here. He is using ammonium lactate lotion to the lower extremities to help exfoliate dry cracked skin. He has compression stockings from elastic therapy in [place]. The wound on the medial part of his left first met head is healed today. READMISSION 04/12/2021 Mr. Man is a patient we know fairly well he had a prolonged stay in clinic in 2019 with wounds on his left lateral and left anterior lower extremity in the setting of chronic venous insufficiency. More recently he was here earlier this year with predominantly an area on his left foot first metatarsal head plantar and he says the plantar foot broke down on its not long after we discharged him but he did not come back here. The last  few months areas of broken down on his left anterior and again the left lateral lower extremity. The leg itself is very swollen chronically enlarged a lot of hyperkeratotic dry Berry Q skin in the left lower leg. His edema extends well into the thigh. He was seen by Dr. Donzetta Matters. He had ABIs on 03/02/2021 showing an ABI on the right of 1 with a TBI of 0.72 his ABI in the left at 1.09 TBI of 0.99. Monophasic and biphasic waveforms on the right. On the left monophasic waveforms were noted he went on to have an angiogram on 03/27/2021 this showed the aortic aortic and iliac segments were  free of flow-limiting stenosis the left common femoral vein to evaluate the left femoral to anterior tibial artery bypass was unobstructed the bypass was patent without any areas of stenosis. We discharged the patient in bilateral juxta lite stockings but very clearly that was not sufficient to control the swelling and maintain skin integrity. He is clearly going to need compression pumps. 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 This is fortunate because he is on his feet for long periods of time. 10/27; patient comes in with our intake nurse reporting copious amount of green drainage from the left anterior mid tibia the left dorsal foot and to a lesser extent the left medial mid tibia. We left the compression wrap on all week for the amount of edema in his left leg is quite a bit better. We use silver alginate as the primary dressing 11/3; edema control is good. Left anterior lower leg left medial lower leg and the plantar first metatarsal head. The left anterior lower leg required debridement. Deep tissue culture I did of this wound showed MRSA I put him on 10 days of doxycycline which she will start today. We have him in compression wraps. He has a security card and [employer] however he is retiring on November 15. We will need to then get him into a better offloading boot  for the left foot perhaps a total contact cast 11/10; edema control is quite good. Left anterior and left medial lower leg wounds in the setting of chronic venous insufficiency and lymphedema. He also has a substantial area over the left plantar first metatarsal head. 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 He has significant hypergranulation on the left plantar foot wound. The patient is a diabetic but he does not have significant PAD 11/17; edema control is quite good. Left anterior and left medial lower leg wounds look better. The really concerning area remains the area on the left plantar first metatarsal head. He has a rim of epithelialization. 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 Starting today with a forefoot off loader but . possibly a total contact cast. 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 Arrives with a very significant odor from I think all of the wounds on the left leg and the left foot. Because of this I did not put a total contact cast on him today but will could still consider this. His wife was having cataract [name], [name] ([id number]) [id number] Page 4 of 19 surgery which is the reason he missed the appointment 12/6. I saw this man 5 days ago with a swelling below the popliteal fossa. I thought he actually might have a Baker's cyst however the DVT rule out study that we could arrange right away was negative the technician told me this was not a ruptured Baker's cyst. We attempted to get this aspirated by under ultrasound guidance in interventional radiology however all they did was an ultrasound however it shows an extensive  fluid collection 62 x 8 x 9.4 in the left thigh and left calf. The patient  states he thinks this started 8 days ago or so but he really is not complaining of any pain, fever or systemic symptoms. He has not ha 12/20; after some difficulty I managed to get the patient into see Dr. Donzetta Matters. Eventually he was taken into the hospital and had a drain put in the fluid collection below his left knee posteriorly extending into the posterior thigh. He still has the drain in place. Culture of this showed moderate staff aureus few Morganella and few Klebsiella he is now on doxycycline and ciprofloxacin as suggested by infectious disease he is on this for a month. The drain will remain in place until it stops draining 12/29; he comes in today with the 1 wound on his left leg and the area on the left plantar first met head significantly smaller. Both look healthy. He still has the drain in the left leg. He says he has to change this daily. Follows up with Dr. Donzetta Matters on January 11. 06/29/2021; the wounds that I am following on the left leg and left first met head continued to be quite healthy. However the area where his inferior drain is in place had copious amounts of drainage which was green in color. The wound here is larger. Follows up with Dr. Gwenlyn Saran of vein and vascular his surgeon next week as well as infectious disease. He remains on ciprofloxacin and doxycycline. He is not complaining of excessive pain in either one of the drain areas 1/12; the patient saw vascular surgery and infectious disease. Vascular surgery has left the drain in place as there was still some notable drainage still see him back in 2 weeks. Dr. Velna Ochs stop the doxycycline and ciprofloxacin and I do not believe he follows up with them at this point. Culture I did last week showed both doxycycline resistant MRSA and Pseudomonas not sensitive to ciprofloxacin although only in rare titers 1/19; the patient's wound on the left anterior lower leg is  just about healed. We have continued healing of the area that was medially on the left leg. Left first plantar metatarsal head continues to get smaller. The major problem here is his 2 drain sites 1 on the left upper calf and lateral thigh. There is purulent drainage still from the left lateral thigh. I gave him antibiotics last week but we still have recultured. He has the drain in the area I think this is eventually going to have to come out. I suspect there will be a connecting wound to heal here perhaps with improved VAc 1/26; the patient had his drain removed by vein and vascular on 1/25/. This was a large pocket of fluid in his left thigh that seem to tunnel into his left upper calf. 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 His mention his Penrose drain was removed today. He now has a tunneling wound on his left calf and left thigh. Both of these probe widely towards each other although I cannot really prove that they connect. Both wounds on his lower leg anteriorly are closed and his area over the first metatarsal head on his right foot continues to improve. We are using Hydrofera Blue here. He also saw infectious disease culture of the abscess they noted was polymicrobial with MRSA, Morganella and Klebsiella he was treated with doxycycline and ciprofloxacin for 4 weeks ending on 07/03/2021. They did not recommend any further antibiotics. Notable that while he still had the Penrose drain in place last week he had purulent drainage  coming out of the inferior IandD site this grew [facility], MRSA and Pseudomonas but there does not appear to be any active infection in this area today with the drain out and he is not systemically unwell 2/2; with regards to the drain sites the superior one on the thigh actually is closed down the one on the upper left lateral calf measures about 8 and half centimeters which is an improvement seems to be less prominent although still with a lot  of drainage. The only remaining wound is over the first metatarsal head on the left foot and this looks to be continuing to improve with Hydrofera Blue. 2/9; the area on his plantar left foot continues to contract. Callus around the wound edge. The drain sites specifically have not come down in depth. We put the wound VAC on Monday he changed the canister late last night our intake nurse reported a pocket of fluid perhaps caused by our compression wraps 2/16; continued improvement in left foot plantar wound. drainage site in the calf is not improved in terms of depth (wound vac) 2/23; continued improvement in the left foot wound over the first metatarsal head. With regards to the drain sites the area on his thigh laterally is healed however the open area on his calf is small in terms of circumference by still probes in by about 15 cm. Within using the wound VAC. Hydrofera Blue on his foot 08/24/2021: The left first metatarsal head wound continues to improve. The wound bed is healthy with just some surrounding callus. Unfortunately the open drain site on his calf remains open and tunnels at least 15 cm (the extent of a Q-tip). This is despite several weeks of wound VAC treatment. Based on reading back through the notes, there has been really no significant change in the depth of the wound, although the orifice is smaller and the more cranial wound on his thigh has closed. I suspect the tunnel tracks nearly all the way to this location. 08/31/2021: Continued improvement in the left first metatarsal head wound. There has been absolutely no improvement to the long tunnel from his open drain site on his calf. We have tried to get him into see vascular surgery sooner to consider the possibility of simply filleting the tract open and allowing it to heal from the bottom up, likely with a wound VAC. They have not yet scheduled a sooner appointment than his current mid April 09/14/2021: He was seen by vascular  surgery and they took him to the operating room last week. They opened a portion of the tunnel, but did not extend the entire length of the known open subcutaneous tract. I read Dr. Claretha Cooper operative note and it is not clear from that documentation why only a portion of the tract was opened. The heaped up granulation tissue was curetted and removed from at least some portion of the tract. They did place a wound VAC and applied an Unna boot to the leg. The ulcer on his left first metatarsal head is smaller today. The bed looks good and there is just a small amount of surrounding callus. 09/21/2021: The ulcer on his left first metatarsal head looks to be stalled. There is some callus surrounding the wound but the wound bed itself does not appear particularly dynamic. The tunnel tract on his lateral left leg seems to be roughly the same length or perhaps slightly smaller but the wound bed appears healthy with good granulation tissue. He opened up a new wound  on his medial thigh and the site of a prior surgical incision. He says that he did this unconsciously in his sleep by scratching. 09/28/2021: Unfortunately, the ulcer on his left first metatarsal head has extended underneath the callus toward the dorsum of his foot. The medial thigh wounds are roughly the same. The tunnel on his lateral left leg continues to be problematic; it is longer than we are able to actually probe with a Q-tip. I am still not certain as to why Dr. Donzetta Matters did not open this up entirely when he took the patient to the operating room. We will likely be back in the same situation with just a small superficial opening in a long unhealed tract, as the open portion is granulating in nicely. 10/02/2021: The patient was initially scheduled for a nurse visit, but we are also applying a total contact cast today. The plantar foot wound looks clean without significant accumulated callus. We have been applying Prisma silver collagen to the  site. 10/05/2021: The patient is here for his first total contact cast change. We have tried using gauze packing strips in the tunnel on his lateral leg wound, but this does not seem to be working any better than the white VAC foam. The foot ulcer looks about the same with minimal periwound callus. Medial thigh wound is clean with just some overlying eschar. 10/12/2021: The plantar foot wound is stable without any significant accumulation of periwound callus. The surface is viable with good granulation tissue. The medial thigh wounds are much smaller and are epithelializing. On the other hand, he had purulent drainage coming from the tunnel on his lateral leg. He does go back to see Dr. Donzetta Matters next week and is planning to ask him why the wound tunnel was not completely opened at the time of his most recent operation. 10/19/2021: The plantar foot wound is markedly improved and has epithelial tissue coming through the surface. The medial thigh wounds are nearly closed with just a tiny open area. He did see Dr. Donzetta Matters earlier this week and apparently they did discuss the possibility of opening the sinus tract further and enabling a wound VAC application. Apparently there are some limits as to what Dr. 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 I think [name], [name] ([id number]) [id number] Page 5 of 19 if we could get the tract open to the level of the popliteal fossa, this would greatly aid in her ability to get this chart closed. That being said, however, today when I probed the tract with a Q-tip, I was not able to insert the entirety of the Q-tip as I have on previous occasions. The tunnel is shorter by about 4 cm. The surface is clean with good granulation tissue and no further episodes of purulent drainage. 10/30/2021: Last week, the patient underwent surgery and had the long tract in his leg opened. There was a rind that was debrided,  according to the operative report. His medial thigh ulcers are closed. The plantar foot wound is clean with a good surface and some built up surrounding callus. 11/06/2021: The overall dimensions of the large wound on his lateral leg remain about the same, but there is good granulation tissue present and the tunneling is a little bit shorter. He has a new wound on his anterior tibial surface, in the same location where he had a similar lesion in the past. The plantar foot wound is clean with some buildup surrounding callus. Just toward the medial  aspect of his foot, however, there is an area of darkening that once debrided, revealed another opening in the skin surface. 11/13/2021: The anterior tibial surface wound is closed. The plantar foot wound has some surrounding callus buildup. The area of darkening that I debrided last week and revealed an opening in the skin surface has closed again. The tunnel in the large wound on his lateral leg has come in by about 3 cm. There is healthy granulation tissue on the entire wound surface. 11/23/2021: The patient was out of town last week and did wet-to-dry dressings on his large wound. 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 Unfortunately, he picked open the wound on his left medial thigh. He says that it was itching and he just could not stop scratching it until it was open again. The wound on his plantar foot is smaller and has not accumulated a tremendous amount of callus. The lateral leg wound is shallower and the tunnel has also decreased in depth. There is just a little bit of slough accumulation on the surface. 11/30/2021: Another portion of his left medial thigh has been opened up. All of these wounds are fairly superficial with just a little bit of slough and eschar accumulation. The wound on his plantar foot is almost closed with just a bit of eschar and periwound callus accumulation. The  lateral leg wound is nearly flush with the surrounding skin and the tunnel is markedly shallower. 12/07/2021: There is just 1 open area on his left medial thigh. It is clean with just a little bit of perimeter eschar. The wound on his plantar foot continues to contract and just has some eschar and periwound callus accumulation. The lateral leg wound is closing at the more distal aspect and the tunnel is smaller. The surface is nearly flush with the surrounding skin and it has a good bed of granulation tissue. 12/14/2021: The thigh and foot wounds are closed. The lateral leg wound has closed over approximately half of its length. The tunnel continues to contract and the surface is now flush with the surrounding skin. The wound bed has robust granulation tissue. 12/22/2021: The thigh and foot wounds have reopened. The foot wound has a lot of callus accumulation around and over it. The thigh wound is tiny with just a little bit of slough in the wound bed. The lateral leg wound continues to contract. His vascular surgeon took the wound VAC off earlier in the week and the patient has been doing wet-to-dry dressings. There is a little slough accumulation on the surface. The tunnel is about 3 cm in depth at this point. 12/28/2021: The thigh wound is closed again. The foot wound has some callus that subsequently has peeled back exposing just a small slit of a wound. The lateral leg wound Is down to about half the size that it originally was and the tunnel is down to about half a centimeter in depth. 01/04/2022: The thigh wound remains closed. The foot wound has heavy callus overlying the wound site. Once this was debrided, the wound was found to be closed. The lateral leg wound is smaller again this week and very superficial. No tunnel could be identified. 01/12/2022: The thigh and foot wounds both remain closed. The lateral leg wound is now nearly flush with the skin surface. There is good granulation  tissue present with a light layer of slough. 01/19/2022: Due to the way his wrap was  placed, the patient did not change the dressing on his thigh at all and so the foam was saturated and his skin is macerated. There is a light layer of slough on the wound surface. The underlying granulation tissue is robust and healthy-appearing. He has heavy callus buildup at the site of his first metatarsal head wound which is still healed. 02/01/2022: He has been in silver alginate. When he removed the dressing from his thigh wound, however, some leg, superficially reopening a portion of the wound that had healed. In addition, underneath the callus at his left first metatarsal head, there appears to be a blister and the wound appears to be open again. 02/08/2022: The lateral leg wound has contracted substantially. There is eschar and a light layer of slough present. He says that it is starting to pull and is uncomfortable. On inspection, there is some puckering of the scar and the eschar is quite dry; this may account for his symptoms. On his first metatarsal head, the wound is much smaller with just some eschar on the surface. The callus has not reaccumulated. He reports that he had a blister come up on his medial thigh wound at the distal aspect. It popped and there is now an opening in his skin again. Looking back through his [place] of wound photos, there is what looks like a permanent suture just deep to this location and it may be trying to erode through. We have been using silver alginate on his wounds. 02/15/2022: The lateral leg wound is about half the size it was last week. It is clean with just a little perimeter eschar and light slough. The wound on his first metatarsal head is about the same with heavy callus overlying it. The medial thigh wound is closed again. He does have some skin changes on the top of his foot that looks potentially yeast related. 02/22/2022: The skin on the top of his foot improved  with the use of a topical antifungal. The lateral leg wound continues to contract and is again smaller this week. There is a little bit of slough and eschar on the surface. The first metatarsal head wound is a little bit smaller but has reaccumulated a thick callus over the top. He decided to try to trim his toenail and ultimately took the entire nail off of his left great toe. 03/02/2022: His lateral leg wound continues to improve, as does the wound on his left great toe. Unfortunately, it appears that somehow his foot got wet and moisture seeped in through the opening causing his skin to lift. There is a large wound now overlying his first metatarsal on both the plantar, medial, and dorsal portion of his foot. There is necrotic tissue and slough present underneath the shaggy macerated skin. 03/08/2022: The lateral leg wound is smaller again today. There is just a light layer of slough and eschar on the surface. The great toe wound is smaller again today. The first metatarsal wound is a little bit smaller today and does not look nearly as necrotic and macerated. There is still slough and nonviable tissue present. 03/15/2022: The lateral leg wound is narrower and just has a little bit of light slough buildup. The first metatarsal wound still has a fair amount of moisture affecting the periwound skin. The great toe wound is healed. 03/22/2022: The lateral leg wound is now isolated to just at the level of his knee. There is some eschar and slough accumulation. The first metatarsal head wound has epithelialized tremendously  and is about half the size that it was last week. He still has some maceration on the top of his foot and a fungal odor is present. 03/29/2022: T oday the patient's foot was macerated, suggesting that the cast got wet. The patient has also been picking at his dry skin and has enlarged the wound on his left lateral leg. In the time between having his cast removed and my evaluation, he had  picked more dry skin and opened up additional wounds on his Achilles area and dorsal foot. The plantar first metatarsal head wound, however, is smaller and clean with just macerated callus around the perimeter and light slough on the surface. The lateral leg wound measured a little bit larger but is also fairly clean with eschar and minimal slough. 04/02/2022: The patient had vascular studies done last Friday and so his cast was not applied. He is here today to have that done. 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 04/05/2022: Both wounds are smaller and quite clean. There is just a little biofilm on the lateral leg wound. 10/20; the patient has a wound on the left lateral surgical incision at the level of his lateral knee this looks clean and improved. He is using silver alginate. He FILLMORE, BOIK ([id number]) [id number] Page 6 of 19 also has an area on his left medial foot for which she is using Hydrofera Blue under a total contact cast both wounds are measuring smaller 04/20/2022: The plantar foot wound has contracted considerably and is very close to closing. The lateral leg wound was measured a little larger, but there was a tiny open area that was included in the measurements that was not included last week. He has some eschar around the perimeter but otherwise the wound looks clean. 04/27/2022: The lateral leg wound looks better this week. He says that midweek, he felt it was very dry and began applying hydrogel to the site. I think this was beneficial. The foot wound is nearly closed underneath a thick layer of dry skin and callus. 05/04/2022: The foot wound is healed. He has developed a new small ulcer on his anterior tibial surface about midway up his leg. It has a little slough on the surface. The lateral leg wound still is fairly dry, but clean with just a little biofilm on the surface. 05/11/2022: The wound on his foot reopened on Wednesday. A  large blister formed which then broke open revealing the fat layer underneath. The ulcer on his anterior tibial surface is a little bit larger this week. The lateral leg wound has much better moisture balance this week. Fortunately, prior to his foot wound reopening, he did get the cast made for his orthotic. 05/15/2022: Already, the left medial foot wound has improved. The tissue is less macerated and the surface is clean. The ulcer on his anterior tibial surface continues to enlarge. This seems likely secondary to accumulated moisture. The lateral leg wound continues to have an improved moisture balance with the use of collagen. 05/25/2022: The medial foot wound continues to contract. It is now substantially smaller with just a little slough on the surface. The anterior tibial surface wound continues to enlarge further. Once again, this seems to be secondary to moisture. The lateral leg wound does not seem to be changing much in size, but the moisture balance is better. 06/01/2022: The anterior tibial wound is closed. The medial foot wound is down to just a very small, couple of millimeters, opening. The lateral  leg wound has good moisture balance, but remains unchanged in size. 12/15; the patient's anterior tibial wound has reopened, however the area on his right first metatarsal head is closed. The major wound is actually on the superior part of his surgical wound in the left lateral thigh. Not a completely viable surface under illumination. This may at some point require a debridement I think he is currently using Prisma. As noted the left medial foot wound has closed 06/14/2022: The anterior tibial wound has closed. The lateral leg wound has a better surface but is basically unchanged in size. The left medial foot wound has reopened. It looks as though there was some callus accumulation and moisture got under the callus which caused the tissue to break down again. 06/21/2022: A new wound has  opened up just distal to the previous anterior tibial wound. It is small but has hypertrophic granulation tissue present. The lateral leg wound is a little bit narrower and has a layer of slough on the surface. The left medial foot wound is down to just a pinhole. His custom orthotics should be available next week. 06/28/2022: The wound on his first metatarsal head has healed. He has developed a new small wound on his medial lower leg, in an old scar site. The lateral leg wound continues to contract but continues to accumulate slough, as well. 07/03/2022: Despite wearing his custom orthopedic shoes, he managed to reopen the wound on his first metatarsal head. He says he thinks his foot got wet and then some skin lifted up and he peeled this away. Both of the lower leg wounds are smaller and have some dry eschar on the surface. The lateral leg wound is quite a bit narrower today. 07/12/2022: The medial lower leg wound is closed. The anterior lower leg wound has contracted considerably. The lateral upper leg wound is narrower with a layer of slough on the surface. The first metatarsal head wound is also smaller, but had copious drainage which saturated the foam border dressing and resulted in some periwound tissue maceration. Fortunately there was no breakdown at this site. 07/19/2022: The lower leg shows signs of significant maceration; I think he must be sweating excessively inside his cast. There are several areas of skin breakdown present. The wound on his foot is smaller and that on his lateral leg is narrower and is shorter by about a centimeter. 07/26/2022: Last week we used a zinc Coflex wrap prior to applying his total contact cast and this has had the effect of keeping his skin from getting macerated this week. The anterior leg wound has epithelialized substantially. The lateral leg wound is significantly smaller with just a bit of slough on the surface. The first metatarsal head wound is also smaller  this week. 08/02/2022: The anterior leg wound was closed on arrival, but while he was sitting in the room, he picked it open again. The lateral leg wound is smaller with just a little slough on the surface and the first metatarsal head wound has contracted further, as well. 08/09/2022: The first metatarsal head wound is covered with callus. Underneath the callus, it is nearly completely closed. The lateral leg wound is smaller again this week. The anterior leg wound looks better, but he has such heavy buildup of old skin, that moisture is getting underneath this, becoming trapped, and causing the underlying skin to get macerated and open up. 08/16/2022: The first metatarsal head wound is closed. The lateral leg wound continues to contract and is quite  a bit smaller again this week. There is just a small, superficial opening remaining on his anterior tibial surface. 08/23/2022: The first metatarsal head wound has, by some miracle, remained closed. The lateral leg wound is substantially smaller with multiple areas of epithelialization. The anterior tibial surface wound is also quite a bit smaller and very clean. 08/30/2022: Unfortunately, his first metatarsal head wound opened up again. It happened in the same fashion as it has on prior occasions. Moisture got under dried skin/callus and created a wound when he removed his sock, taking the skin with it. The anterior tibial surface has a thick shell of hyperkeratotic skin. This has been contributing to ongoing repeat wounding events as moisture gets underneath this and causes tissue breakdown. 3/15; patient presents for follow-up. His anterior left leg wound has healed. He still has the wound to the left lateral aspect and left first met head. We have been using silver alginate and endoform to these areas under [employer]. He has no issues or complaints today. He has been taking Augmentin and reports improvement to his symptoms to the left first met  head. 09/13/2022: He has accumulated more thick dry skin in sheets on his lower leg. The lateral leg wound is about the same size and the left first metatarsal head wound is a little bit smaller. There is slough on both surfaces. There is callus buildup around the foot wound. 09/20/2022: The lateral leg wound is a little bit narrower and the left first metatarsal head wound also seems to have contracted slightly. There is slough on both surfaces. He has a little skin breakdown on his anterior tibial surface. 09/27/2022: The lateral leg wound continues to contract and is quite clean. The first metatarsal head wound is also smaller. There is some perimeter callus and slough accumulation on the foot. The anterior tibial surface is closed. Electronic Signature(s) Signed: 09/27/2022 11:06:07 AM By: [name] Maudlin MD FACS Entered By: [name] on 09/27/2022 11:06:07 [name] ([id number]) [id number] Page 7 of 19 -------------------------------------------------------------------------------- Physical Exam Details Patient Name: Date of Service: [name], NAJARIAN 09/27/2022 10:00 A M Medical Record Number: [id number] Patient Account Number: [id number] Date of Birth/Sex: Treating [name]: 12/18/1950 (72 y.o. M) Primary Care Provider: Jilda [name] Other Clinician: Referring Provider: Treating Provider/Extender: [name] in Treatment: 69 Constitutional Hypertensive, asymptomatic. . . . no acute distress. Respiratory Normal work of breathing on room air. Notes 09/27/2022: The lateral leg wound continues to contract and is quite clean. The first metatarsal head wound is also smaller. There is some perimeter callus and slough accumulation on the foot. The anterior tibial surface is closed. Electronic Signature(s) Signed: 09/27/2022 11:40:36 AM By: [name] Maudlin MD FACS Previous Signature: 09/27/2022 11:06:42 AM Version By: [name] Maudlin MD  FACS Entered By: [name] on 09/27/2022 11:40:36 -------------------------------------------------------------------------------- Physician Orders Details Patient Name: Date of Service: [name]. 09/27/2022 10:00 A M Medical Record Number: [id number] Patient Account Number: [id number] Date of Birth/Sex: Treating [name]: 11-05-1950 (72 y.o. [name] Primary Care Provider: Jilda [name] Other Clinician: Referring Provider: Treating Provider/Extender: [name] in Treatment: 70 Verbal / Phone Orders: No Diagnosis Coding ICD-10 Coding Code Description 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.828 Non-pressure chronic ulcer of other part of left lower leg with other specified severity I87.322 Chronic venous hypertension (idiopathic) with inflammation of left lower extremity E11.51 Type 2 diabetes mellitus with diabetic peripheral angiopathy without  gangrene I89.0 Lymphedema, not elsewhere classified L85.9 Epidermal thickening, unspecified Follow-up Appointments ppointment in 1 week. - Dr [name] - Room 2 Return A Anesthetic Wound #22 Left,Proximal,Lateral Lower Leg (In clinic) Topical Lidocaine 4% applied to wound bed Bathing/ Shower/ Hygiene May shower with protection but do not get wound dressing(s) wet. Protect dressing(s) with water repellant cover (for example, large plastic bag) or a cast cover and may then take shower. Edema Control - Lymphedema / SCD / Other Avoid standing for long periods of time. Patient to wear own compression stockings every day. - on right leg; Moisturize legs daily. Compression stocking or Garment 20-30 mm/Hg pressure to: - left leg daily Off-Loading Wound #18R Left,Plantar Metatarsal head first Open toe surgical shoe to: - Front off loader Left ft Other: - minimal weight bearing left foot [name], [name] ([id number])  [id number] Page 8 of 19 Wound Treatment Wound #18R - Metatarsal head first Wound Laterality: Plantar, Left Cleanser: Soap and Water 1 x Per Week/30 Days Discharge Instructions: May shower and wash wound with dial antibacterial soap and water prior to dressing change. Cleanser: Wound Cleanser 1 x Per Week/30 Days Discharge Instructions: Cleanse the wound with wound cleanser prior to applying a clean dressing using gauze sponges, not tissue or cotton balls. Prim Dressing: Sorbalgon AG Dressing 2x2 (in/in) 1 x Per Week/30 Days ary Discharge Instructions: Apply to wound bed as instructed Secondary Dressing: Optifoam Non-Adhesive Dressing, 4x4 in 1 x Per Week/30 Days Discharge Instructions: Apply over primary dressing as directed. Secondary Dressing: Zetuvit Plus 4x8 in 1 x Per Week/30 Days Discharge Instructions: Apply over primary dressing as directed. Compression Wrap: CoFlex Zinc Unna Boot, 4 x 6 (in/yd) 1 x Per Week/30 Days Discharge Instructions: Apply Coflex Zinc [employer] as directed. Wound #22 - Lower Leg Wound Laterality: Left, Lateral, Proximal Cleanser: Soap and Water 3 x Per Week/30 Days Discharge Instructions: May shower and wash wound with dial antibacterial soap and water prior to dressing change. Cleanser: Wound Cleanser 3 x Per Week/30 Days Discharge Instructions: Cleanse the wound with wound cleanser prior to applying a clean dressing using gauze sponges, not tissue or cotton balls. Peri-Wound Care: Sween Lotion (Moisturizing lotion) 3 x Per Week/30 Days Discharge Instructions: Apply moisturizing lotion to the leg Topical: Skintegrity Hydrogel 4 (oz) 3 x Per Week/30 Days Discharge Instructions: Apply hydrogel as directed Prim Dressing: Endoform 2x2 in 3 x Per Week/30 Days ary Discharge Instructions: Moisten with Hydrogel or saline Secondary Dressing: ABD Pad, 8x10 (Generic) 3 x Per Week/30 Days Discharge Instructions: Apply over primary dressing  as directed. Secured With: Elastic Bandage 4 inch (ACE bandage) 3 x Per Week/30 Days Discharge Instructions: Secure with ACE bandage as directed. Patient Medications llergies: No Known Drug Allergies A Notifications Medication Indication Start End 09/27/2022 lidocaine DOSE topical 4 % cream - cream topical Electronic Signature(s) Signed: 09/27/2022 12:44:03 PM By: [name] Maudlin MD FACS Entered By: [name] on 09/27/2022 11:40:55 -------------------------------------------------------------------------------- Problem List Details Patient Name: Date of Service: [name]. 09/27/2022 10:00 A M Medical Record Number: [id number] Patient Account Number: [id number] Date of Birth/Sex: Treating [name]: [birth year] (72 y.o. M) Primary Care Provider: Jilda [name] Other Clinician: Referring Provider: Treating Provider/Extender: [name] in Treatment: 35 Active Problems ICD-10 Encounter Code Description Active Date MDM Diagnosis JAVAN, CUSTALOW ([id number]) [id number] Page 9 of 19 L97.528 Non-pressure chronic ulcer of other part of left foot with other specified 08/26/2022 No Yes severity L97.828 Non-pressure chronic ulcer of other part of  left lower leg with other specified 04/12/2021 No Yes severity I87.322 Chronic venous hypertension (idiopathic) with inflammation of left lower 04/12/2021 No Yes extremity E11.51 Type 2 diabetes mellitus with diabetic peripheral angiopathy without gangrene 04/12/2021 No Yes I89.0 Lymphedema, not elsewhere classified 04/12/2021 No Yes L85.9 Epidermal thickening, unspecified 08/30/2022 No Yes Inactive Problems ICD-10 Code Description Active Date Inactive Date E11.621 Type 2 diabetes mellitus with foot ulcer 04/12/2021 04/12/2021 L97.828 Non-pressure chronic ulcer of other part of left lower leg with other specified severity 06/08/2022 06/08/2022 E11.42 Type 2 diabetes mellitus with diabetic  polyneuropathy 04/12/2021 04/12/2021 L02.416 Cutaneous abscess of left lower limb 06/13/2021 06/13/2021 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 07/20/2021 07/20/2021 Resolved Problems Electronic Signature(s) Signed: 09/27/2022 11:04:58 AM By: [name] Maudlin MD FACS Entered By: [name] on 09/27/2022 11:04:58 -------------------------------------------------------------------------------- Progress Note Details Patient Name: Date of Service: [name]. 09/27/2022 10:00 A M Medical Record Number: [id number] Patient Account Number: [id number] Date of Birth/Sex: Treating [name]: 1950/12/27 (72 y.o. M) Primary Care Provider: Jilda [name] Other Clinician: Referring Provider: Treating Provider/Extender: [name] in Treatment: 58 Subjective Chief Complaint Information obtained from Patient Left leg and foot ulcers 04/12/2021; patient is here for wounds on his left lower leg and left plantar foot over the first metatarsal head History of Present Illness (HPI) 10/11/17; Mr. Melear is a 72 year old man who tells me that in 2015 he slipped down the latter traumatizing his left leg. He developed a wound in the same spot [name], [name] ([id number]) [id number] Page 10 of 19 the area that we are currently looking at. He states this closed over for the most part although he always felt it was somewhat unstable. In 2016 he hit the same area with the door of his car had this reopened. He tells me that this is never really closed although sometimes an inflow it remains open on a constant basis. He has not been using any specific dressing to this except for topical antibiotics the nature of which were not really sure. His primary doctor did send him to see Dr. Einar [name] of interventional cardiology. He underwent an angiogram on 08/06/17 and he underwent a PTA and directional atherectomy of the lesser distal SFA and  popliteal arteries which resulted in brisk improvement in blood flow. It was noted that he had 2 vessel runoff through the anterior tibial and peroneal. He is also been to see vascular and interventional radiologist. He was not felt to have any significant superficial venous insufficiency. Presumably is not a candidate for any ablation. It was suggested he come here for wound care. The patient is a type II diabetic on insulin. He also has a history of venous insufficiency. ABIs on the left were noncompressible in our clinic 10/21/17; patient we admitted to the clinic last week. 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 We put Iodosorb on him after an aggressive debridement and 3 layer compression. He complained of pain in his ankle and itching with is skin in fact he scratched the area on the medial calf superiorly at the rim of our wraps and he has 2 small open areas in that location today which are new. I changed his primary dressing today to silver collagen. As noted he is already had revascularization and does not have any significant superficial venous insufficiency that would be amenable to ablation 10/28/17; patient admitted to the clinic 2 weeks  ago. He has a smaller Wound. Scratch injury from last week revealed. There is large wound over the tibial area. This is smaller. Granulation looks healthy. No need for debridement. 11/04/17; the wound on the left lateral calf looks better. Improved dimensions. Surface of this looks better. We've been maintaining him and Kerlix Coban wraps. He finds this much more comfortable. Silver collagen dressing 11/11/17; left lateral Wound continues to look healthy be making progress. Using a #5 curet I removed removed nonviable skin from the surface of the wound and then necrotic debris from the wound surface. Surface of the wound continues to look healthy. ooHe also has an open area on the left  great toenail bed. We've been using topical antibiotics. 11/19/17; left anterior lateral wound continues to look healthy but it's not closed. ooHe also had a small wound above this on the left leg ooInitially traumatic wounds in the setting of significant chronic venous insufficiency and stasis dermatitis 11/25/17; left anterior wounds superiorly is closed still a small wound inferiorly. 12/02/17; left anterior tibial area. Arrives today with adherent callus. Post debridement clearly not completely closed. Hydrofera Blue under 3 layer compression. 12/09/17; left anterior tibia. Circumferential eschar however the wound bed looks stable to improved. We've been using Hydrofera Blue under 3 layer compression 12/17/17; left anterior tibia. Apparently this was felt to be closed however when the wrap was taken off there is a skin tear to reopen wounds in the same area we've been using Hydrofera Blue under 3 layer compression 12/23/17 left anterior tibia. Not close to close this week apparently the [facility] was stuck to this again. Still circumferential eschar requiring debridement. I put a contact layer on this this time under the Hydrofera Blue 12/31/17; left anterior tibia. Wound is better slight amount of hyper-granulation. Using Hydrofera Blue over Adaptic. 01/07/18; left anterior tibia. The wound had some surface eschar however after this was removed he has no open wound.he was already revascularized by Dr. Einar [name] when he came to our clinic with atherectomy of the left SFA and popliteal artery. He was also sent to interventional radiology for venous reflux studies. He was not felt to have significant reflux but certainly has chronic venous changes of his skin with hemosiderin deposition around this area. He will definitely need to lubricate his skin and wear compression stocking and I've talked to him about this. READMISSION 05/26/2018 This is a now 72 year old man we cared for with traumatic wounds on  his left anterior lower extremity. He had been previously revascularized during that admission by Dr. Einar [name]. Apparently in follow-up Dr. Einar [name] noted that he had deterioration in his arterial status. He underwent a stent placement in the distal left SFA on 04/22/2018. 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 He went back to the angiography suite on 04/30/2018 he underwent PTA and balloon angioplasty of the occluded left mid anterior tibial artery, thrombotic occlusion went from 100 to 0% which reconstitutes the posterior tibial artery. He had thrombectomy and aspiration of the peroneal artery. The stent placed in the distal SFA left SFA was still occluded. 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 He has been recuperating at [place] home in [facility] after the most recent hospitalization. He is going home  tomorrow. He tells me that 2 weeks ago he traumatized the tip of his left fifth toe. He came in urgently for our review of this. This was a history of before I noted that Dr. Einar [name] had already noted dry gangrenous changes of the left fifth toe 06/09/2018; 2-week follow-up. I did contact Dr. Einar [name] after his last appointment and he apparently saw 1 of Dr. Irven Shelling colleagues the next day. He does not follow-up with Dr. Einar [name] himself until Thursday of this week. 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 Nevertheless there is no evidence of infection no drainage and no pain. He had a new area that this week when we were signing him in today on the left anterior mid tibia area, this is in close proximity to the previous wound we have dealt with in this clinic. 06/23/2018; 2-week follow-up. I did not receive a recent  note from Dr. Einar [name] to review today. Our office is trying to obtain this. He is apparently not planning to do further vascular interventions and wondered about compression to try and help with the patient's chronic venous insufficiency. However we are also concerned about the arterial flow. ooHe arrives in clinic today with a new area on the left third toe. The areas on the calf/anterior tibia are close to closing. The left fifth toe is still mummified using Betadine. -In reviewing things with the patient he has what sounds like claudication with mild to moderate amount of activity. 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 I prescribed Levaquin over the phone while we attempted to arrange a plan of care. However the patient called yesterday to report he had low-grade fever and he came in today acutely. There is been a marked deterioration in the left third toe with spreading cellulitis up into the dorsal left foot. He was referred to the emergency room. Readmission: 06/29/2020 patient presents today for reevaluation here in our clinic he was previously treated by Dr. Dellia Nims at the latter part of 2019 in 2 the beginning of 2020. Subsequently we have not seen him since that time in the interim he did have evaluation with vein and vascular specialist specifically Dr. Anice Paganini who did perform quite extensive work for a left femoral to anterior tibial artery bypass. With that being said in the interim the patient has developed significant lymphedema and has wounds that he tells me have really never healed in regard to the incision site on the left leg. He also has multiple wounds on the feet for various reasons some of which is that he tends to pick at his feet. Fortunately there is no signs of active infection systemically at this time he does have some wounds that are little bit deeper but most are fairly superficial he seems to have good blood flow and overall  everything appears to be healthy I see no bone exposed and no obvious signs of osteomyelitis. I do not know that he necessarily needs a x-ray at this point although that something we could consider depending on how things progress. The patient does have a history of lymphedema, diabetes, this is type II, chronic kidney disease stage III, hypertension, and history of peripheral vascular disease. 07/05/2020; patient admitted last week. Is a patient I remember from 2019 he had a spreading infection involving the left foot and we sent him to the hospital. 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 He subsequently had a left femoral to anterior tibial bypass by Dr.Cain vein and vascular. He also has severe lymphedema with chronic skin changes related to that on the left leg. The most problematic area that was new today was on the left medial great toe. This was apparently a small area last week there was purulent drainage which our intake nurse cultured. Also areas on the left medial foot and heel left lateral foot. He has 2 areas on the left medial calf left lateral calf in the setting of the severe lymphedema. 07/13/2020 on evaluation today patient appears to be doing better in my opinion compared to his last visit. The good news is there is no signs of active infection systemically and locally I do not see any signs of infection either. He did have an x-ray which was negative that is great news he had a culture which showed [name], [name] ([id number]) [id number] Page 11 of 19 MRSA but at the same time he is been on the doxycycline which has helped. I do think we may want to extend this for 7 additional days 1/25; patient admitted to the clinic a few weeks ago. 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 We have been putting him under compression his edema control is a lot better but he is severe  verricused skin on the left leg. He is really done quite well he still has an open area on the left medial calf and the left medial first metatarsal head. We have been using silver collagen on the leg silver alginate on the foot 07/27/2020 upon evaluation today patient appears to be doing decently well in regard to his wounds. He still has a lot of dry skin on the left leg. Some of this is starting to peel back and I think he may be able to have them out by removing some that today. Fortunately there is no signs of active infection at this time on the left leg although on the right leg he does appear to have swelling and erythema as well as some mild warmth to touch. This does have been concerned about the possibility of cellulitis although within the differential diagnosis I do think that potentially a DVT has to be at least considered. We need to rule that out before proceeding would just call in the cellulitis. Especially since he is having pain in the posterior aspect of his calf muscle. 2/8; the patient had seen sparingly. He has severe skin changes of chronic lymphedema in the left leg thickened hyperkeratotic verrucous skin. He has an open wound on the medial part of the left first met head left mid tibia. He also has a rim of nonepithelialized skin in the anterior mid tibia. He brought in the AmLactin lotion that was been prescribed although I am not sure under compression and its utility. There concern about cellulitis on the right lower leg the last time he was here. He was put on on antibiotics. His DVT rule out was negative. The right leg looks fine he is using his stocking on this area 08/10/2020 upon evaluation today patient appears to be doing well with regard to his leg currently. He has been tolerating the dressing changes without complication. Fortunately there is no signs of active infection which is great news. Overall very pleased with where things stand. 2/22; the patient still has  an area on the medial part of the left first met his head. This  looks better than when I last saw this earlier this month he has a rim of epithelialization but still some surface debris. Mostly everything on the left leg is healed. There is still a vulnerable in the left mid tibia area. 08/30/2020 upon evaluation today patient appears to be doing much better in regard to his wounds on his foot. 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 Nonetheless I think we will address that today I Minna send in a prescription for him in that regard. Overall though there does not appear to be any signs of significant worsening. 09/07/2020 on evaluation today patient's wounds over his left foot appear to be doing excellent. I do not see any signs of infection there is some callus buildup this can require debridement for certain but overall I feel like he is managing quite nicely. He still using the AmLactin cream which has been beneficial for him as well. 3/22; left foot wound is closed. There is no open area here. He is using ammonium lactate lotion to the lower extremities to help exfoliate dry cracked skin. He has compression stockings from elastic therapy in [place]. The wound on the medial part of his left first met head is healed today. READMISSION 04/12/2021 Mr. Karabin is a patient we know fairly well he had a prolonged stay in clinic in 2019 with wounds on his left lateral and left anterior lower extremity in the setting of chronic venous insufficiency. More recently he was here earlier this year with predominantly an area on his left foot first metatarsal head plantar and he says the plantar foot broke down on its not long after we discharged him but he did not come back here. The last few months areas of broken down on his left anterior and again the left lateral lower  extremity. The leg itself is very swollen chronically enlarged a lot of hyperkeratotic dry Berry Q skin in the left lower leg. His edema extends well into the thigh. He was seen by Dr. Donzetta Matters. He had ABIs on 03/02/2021 showing an ABI on the right of 1 with a TBI of 0.72 his ABI in the left at 1.09 TBI of 0.99. Monophasic and biphasic waveforms on the right. On the left monophasic waveforms were noted he went on to have an angiogram on 03/27/2021 this showed the aortic aortic and iliac segments were free of flow-limiting stenosis the left common femoral vein to evaluate the left femoral to anterior tibial artery bypass was unobstructed the bypass was patent without any areas of stenosis. We discharged the patient in bilateral juxta lite stockings but very clearly that was not sufficient to control the swelling and maintain skin integrity. He is clearly going to need compression pumps. 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 This is fortunate because he is on his feet for long periods of time. 10/27; patient comes in with our intake nurse reporting copious amount of green drainage from the left anterior mid tibia the left dorsal foot and to a lesser extent the left medial mid tibia. We left the compression wrap on all week for the amount of edema in his left leg is quite a bit better. We use silver alginate as the primary dressing 11/3; edema control is good. Left anterior lower leg left  medial lower leg and the plantar first metatarsal head. The left anterior lower leg required debridement. Deep tissue culture I did of this wound showed MRSA I put him on 10 days of doxycycline which she will start today. We have him in compression wraps. He has a security card and [employer] however he is retiring on November 15. We will need to then get him into a better offloading boot for the left foot perhaps a total contact cast 11/10; edema control is quite good.  Left anterior and left medial lower leg wounds in the setting of chronic venous insufficiency and lymphedema. He also has a substantial area over the left plantar first metatarsal head. 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 He has significant hypergranulation on the left plantar foot wound. The patient is a diabetic but he does not have significant PAD 11/17; edema control is quite good. Left anterior and left medial lower leg wounds look better. The really concerning area remains the area on the left plantar first metatarsal head. He has a rim of epithelialization. 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 Starting today with a forefoot off loader but . possibly a total contact cast. 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 Arrives with a very significant odor from I think all of the wounds on the left leg and the left foot. Because of this I did not put a total contact cast on him today but will could still consider this. His wife was having cataract surgery which is the reason he missed the appointment 12/6. I saw this man 5 days ago with a swelling below the popliteal fossa. I thought he actually might have a Baker's cyst however the DVT rule out study that we could arrange right away was negative the technician told me this was not a ruptured Baker's cyst. We attempted to get this aspirated by under ultrasound guidance in interventional radiology however all they did was an ultrasound however it shows an extensive fluid collection 62 x 8 x 9.4 in the left thigh and left calf. The patient states he thinks this started 8 days ago or so but he really is not complaining of any pain,  fever or systemic symptoms. He has not ha 12/20; after some difficulty I managed to get the patient into see Dr. Donzetta Matters. Eventually he was taken into the hospital and had a drain put in the fluid collection below his left knee posteriorly extending into the posterior thigh. He still has the drain in place. Culture of this showed moderate staff aureus few Morganella and few Klebsiella he is now on doxycycline and ciprofloxacin as suggested by infectious disease he is on this for a month. The drain will remain in place until it stops draining 12/29; he comes in today with the 1 wound on his left leg and the area on the left plantar first met head significantly smaller. Both look healthy. He still has the drain in the left leg. He says he has to change this daily. Follows up with Dr. Donzetta Matters on January 11. 06/29/2021; the wounds that I am following on the left leg and left  first met head continued to be quite healthy. However the area where his inferior drain is in place had copious amounts of drainage which was green in color. The wound here is larger. Follows up with Dr. Gwenlyn Saran of vein and vascular his surgeon next week as well as infectious disease. He remains on ciprofloxacin and doxycycline. He is not complaining of excessive pain in either one of the drain areas 1/12; the patient saw vascular surgery and infectious disease. Vascular surgery has left the drain in place as there was still some notable drainage still see him [name], [name] ([id number]) [id number].pdf Page 12 of 19 back in 2 weeks[address] Dr. Velna Ochs stop the doxycycline and ciprofloxacin and I do not believe he follows up with them at this point. Culture I did last week showed both doxycycline resistant MRSA and Pseudomonas not sensitive to ciprofloxacin although only in rare titers 1/19; the patient's wound on the left anterior lower leg is just about healed. We have continued healing of the area that was medially on the left  leg. Left first plantar metatarsal head continues to get smaller. The major problem here is his 2 drain sites 1 on the left upper calf and lateral thigh. There is purulent drainage still from the left lateral thigh. I gave him antibiotics last week but we still have recultured. He has the drain in the area I think this is eventually going to have to come out. I suspect there will be a connecting wound to heal here perhaps with improved VAc 1/26; the patient had his drain removed by vein and vascular on 1/25/. This was a large pocket of fluid in his left thigh that seem to tunnel into his left upper calf. 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 His mention his Penrose drain was removed today. He now has a tunneling wound on his left calf and left thigh. Both of these probe widely towards each other although I cannot really prove that they connect. Both wounds on his lower leg anteriorly are closed and his area over the first metatarsal head on his right foot continues to improve. We are using Hydrofera Blue here. He also saw infectious disease culture of the abscess they noted was polymicrobial with MRSA, Morganella and Klebsiella he was treated with doxycycline and ciprofloxacin for 4 weeks ending on 07/03/2021. They did not recommend any further antibiotics. Notable that while he still had the Penrose drain in place last week he had purulent drainage coming out of the inferior IandD site this grew [facility], MRSA and Pseudomonas but there does not appear to be any active infection in this area today with the drain out and he is not systemically unwell 2/2; with regards to the drain sites the superior one on the thigh actually is closed down the one on the upper left lateral calf measures about 8 and half centimeters which is an improvement seems to be less prominent although still with a lot of drainage. The only remaining wound is over the first metatarsal head on the left  foot and this looks to be continuing to improve with Hydrofera Blue. 2/9; the area on his plantar left foot continues to contract. Callus around the wound edge. The drain sites specifically have not come down in depth. We put the wound VAC on Monday he changed the canister late last night our intake nurse reported a pocket of fluid perhaps caused by our compression wraps 2/16; continued improvement in left foot plantar  wound. drainage site in the calf is not improved in terms of depth (wound vac) 2/23; continued improvement in the left foot wound over the first metatarsal head. With regards to the drain sites the area on his thigh laterally is healed however the open area on his calf is small in terms of circumference by still probes in by about 15 cm. Within using the wound VAC. Hydrofera Blue on his foot 08/24/2021: The left first metatarsal head wound continues to improve. The wound bed is healthy with just some surrounding callus. Unfortunately the open drain site on his calf remains open and tunnels at least 15 cm (the extent of a Q-tip). This is despite several weeks of wound VAC treatment. Based on reading back through the notes, there has been really no significant change in the depth of the wound, although the orifice is smaller and the more cranial wound on his thigh has closed. I suspect the tunnel tracks nearly all the way to this location. 08/31/2021: Continued improvement in the left first metatarsal head wound. There has been absolutely no improvement to the long tunnel from his open drain site on his calf. We have tried to get him into see vascular surgery sooner to consider the possibility of simply filleting the tract open and allowing it to heal from the bottom up, likely with a wound VAC. They have not yet scheduled a sooner appointment than his current mid April 09/14/2021: He was seen by vascular surgery and they took him to the operating room last week. They opened a portion of the  tunnel, but did not extend the entire length of the known open subcutaneous tract. I read Dr. Claretha Cooper operative note and it is not clear from that documentation why only a portion of the tract was opened. The heaped up granulation tissue was curetted and removed from at least some portion of the tract. They did place a wound VAC and applied an Unna boot to the leg. The ulcer on his left first metatarsal head is smaller today. The bed looks good and there is just a small amount of surrounding callus. 09/21/2021: The ulcer on his left first metatarsal head looks to be stalled. There is some callus surrounding the wound but the wound bed itself does not appear particularly dynamic. The tunnel tract on his lateral left leg seems to be roughly the same length or perhaps slightly smaller but the wound bed appears healthy with good granulation tissue. He opened up a new wound on his medial thigh and the site of a prior surgical incision. He says that he did this unconsciously in his sleep by scratching. 09/28/2021: Unfortunately, the ulcer on his left first metatarsal head has extended underneath the callus toward the dorsum of his foot. The medial thigh wounds are roughly the same. The tunnel on his lateral left leg continues to be problematic; it is longer than we are able to actually probe with a Q-tip. I am still not certain as to why Dr. Donzetta Matters did not open this up entirely when he took the patient to the operating room. We will likely be back in the same situation with just a small superficial opening in a long unhealed tract, as the open portion is granulating in nicely. 10/02/2021: The patient was initially scheduled for a nurse visit, but we are also applying a total contact cast today. The plantar foot wound looks clean without significant accumulated callus. We have been applying Prisma silver collagen to the site. 10/05/2021: The  patient is here for his first total contact cast change. We have tried using  gauze packing strips in the tunnel on his lateral leg wound, but this does not seem to be working any better than the white VAC foam. The foot ulcer looks about the same with minimal periwound callus. Medial thigh wound is clean with just some overlying eschar. 10/12/2021: The plantar foot wound is stable without any significant accumulation of periwound callus. The surface is viable with good granulation tissue. The medial thigh wounds are much smaller and are epithelializing. On the other hand, he had purulent drainage coming from the tunnel on his lateral leg. He does go back to see Dr. Donzetta Matters next week and is planning to ask him why the wound tunnel was not completely opened at the time of his most recent operation. 10/19/2021: The plantar foot wound is markedly improved and has epithelial tissue coming through the surface. The medial thigh wounds are nearly closed with just a tiny open area. He did see Dr. Donzetta Matters earlier this week and apparently they did discuss the possibility of opening the sinus tract further and enabling a wound VAC application. Apparently there are some limits as to what Dr. 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 I think if we could get the tract open to the level of the popliteal fossa, this would greatly aid in her ability to get this chart closed. That being said, however, today when I probed the tract with a Q-tip, I was not able to insert the entirety of the Q-tip as I have on previous occasions. The tunnel is shorter by about 4 cm. The surface is clean with good granulation tissue and no further episodes of purulent drainage. 10/30/2021: Last week, the patient underwent surgery and had the long tract in his leg opened. There was a rind that was debrided, according to the operative report. His medial thigh ulcers are closed. The plantar foot wound is clean with a good surface and some built up surrounding callus. 11/06/2021: The overall  dimensions of the large wound on his lateral leg remain about the same, but there is good granulation tissue present and the tunneling is a little bit shorter. He has a new wound on his anterior tibial surface, in the same location where he had a similar lesion in the past. The plantar foot wound is clean with some buildup surrounding callus. Just toward the medial aspect of his foot, however, there is an area of darkening that once debrided, revealed another opening in the skin surface. 11/13/2021: The anterior tibial surface wound is closed. The plantar foot wound has some surrounding callus buildup. The area of darkening that I debrided last week and revealed an opening in the skin surface has closed again. The tunnel in the large wound on his lateral leg has come in by about 3 cm. There is healthy granulation tissue on the entire wound surface. 11/23/2021: The patient was out of town last week and did wet-to-dry dressings on his large wound. 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 Unfortunately, he picked open the wound on his left medial thigh. He says that it was itching and he just could not stop scratching it until it was open again. The wound on his plantar foot is smaller and has not accumulated a tremendous amount of callus. The lateral leg wound is shallower and the tunnel has also decreased in  depth. There is just a little bit of slough accumulation on the surface. 11/30/2021: Another portion of his left medial thigh has been opened up. All of these wounds are fairly superficial with just a little bit of slough and eschar [name], [name] ([id number]) [phone].pdf Page 13 of 19 accumulation. The wound on his plantar foot is almost closed with just a bit of eschar and periwound callus accumulation. The lateral leg wound is nearly flush with the surrounding skin and the tunnel is markedly  shallower. 12/07/2021: There is just 1 open area on his left medial thigh. It is clean with just a little bit of perimeter eschar. The wound on his plantar foot continues to contract and just has some eschar and periwound callus accumulation. The lateral leg wound is closing at the more distal aspect and the tunnel is smaller. The surface is nearly flush with the surrounding skin and it has a good bed of granulation tissue. 12/14/2021: The thigh and foot wounds are closed. The lateral leg wound has closed over approximately half of its length. The tunnel continues to contract and the surface is now flush with the surrounding skin. The wound bed has robust granulation tissue. 12/22/2021: The thigh and foot wounds have reopened. The foot wound has a lot of callus accumulation around and over it. The thigh wound is tiny with just a little bit of slough in the wound bed. The lateral leg wound continues to contract. His vascular surgeon took the wound VAC off earlier in the week and the patient has been doing wet-to-dry dressings. There is a little slough accumulation on the surface. The tunnel is about 3 cm in depth at this point. 12/28/2021: The thigh wound is closed again. The foot wound has some callus that subsequently has peeled back exposing just a small slit of a wound. The lateral leg wound Is down to about half the size that it originally was and the tunnel is down to about half a centimeter in depth. 01/04/2022: The thigh wound remains closed. The foot wound has heavy callus overlying the wound site. Once this was debrided, the wound was found to be closed. The lateral leg wound is smaller again this week and very superficial. No tunnel could be identified. 01/12/2022: The thigh and foot wounds both remain closed. The lateral leg wound is now nearly flush with the skin surface. There is good granulation tissue present with a light layer of slough. 01/19/2022: Due to the way his wrap was placed, the  patient did not change the dressing on his thigh at all and so the foam was saturated and his skin is macerated. There is a light layer of slough on the wound surface. The underlying granulation tissue is robust and healthy-appearing. He has heavy callus buildup at the site of his first metatarsal head wound which is still healed. 02/01/2022: He has been in silver alginate. When he removed the dressing from his thigh wound, however, some leg, superficially reopening a portion of the wound that had healed. In addition, underneath the callus at his left first metatarsal head, there appears to be a blister and the wound appears to be open again. 02/08/2022: The lateral leg wound has contracted substantially. There is eschar and a light layer of slough present. He says that it is starting to pull and is uncomfortable. On inspection, there is some puckering of the scar and the eschar is quite dry; this may account for his symptoms. On his first metatarsal head, the wound  is much smaller with just some eschar on the surface. The callus has not reaccumulated. He reports that he had a blister come up on his medial thigh wound at the distal aspect. It popped and there is now an opening in his skin again. Looking back through his [place] of wound photos, there is what looks like a permanent suture just deep to this location and it may be trying to erode through. We have been using silver alginate on his wounds. 02/15/2022: The lateral leg wound is about half the size it was last week. It is clean with just a little perimeter eschar and light slough. The wound on his first metatarsal head is about the same with heavy callus overlying it. The medial thigh wound is closed again. He does have some skin changes on the top of his foot that looks potentially yeast related. 02/22/2022: The skin on the top of his foot improved with the use of a topical antifungal. The lateral leg wound continues to contract and is again  smaller this week. There is a little bit of slough and eschar on the surface. The first metatarsal head wound is a little bit smaller but has reaccumulated a thick callus over the top. He decided to try to trim his toenail and ultimately took the entire nail off of his left great toe. 03/02/2022: His lateral leg wound continues to improve, as does the wound on his left great toe. Unfortunately, it appears that somehow his foot got wet and moisture seeped in through the opening causing his skin to lift. There is a large wound now overlying his first metatarsal on both the plantar, medial, and dorsal portion of his foot. There is necrotic tissue and slough present underneath the shaggy macerated skin. 03/08/2022: The lateral leg wound is smaller again today. There is just a light layer of slough and eschar on the surface. The great toe wound is smaller again today. The first metatarsal wound is a little bit smaller today and does not look nearly as necrotic and macerated. There is still slough and nonviable tissue present. 03/15/2022: The lateral leg wound is narrower and just has a little bit of light slough buildup. The first metatarsal wound still has a fair amount of moisture affecting the periwound skin. The great toe wound is healed. 03/22/2022: The lateral leg wound is now isolated to just at the level of his knee. There is some eschar and slough accumulation. The first metatarsal head wound has epithelialized tremendously and is about half the size that it was last week. He still has some maceration on the top of his foot and a fungal odor is present. 03/29/2022: T oday the patient's foot was macerated, suggesting that the cast got wet. The patient has also been picking at his dry skin and has enlarged the wound on his left lateral leg. In the time between having his cast removed and my evaluation, he had picked more dry skin and opened up additional wounds on his Achilles area and dorsal foot. The  plantar first metatarsal head wound, however, is smaller and clean with just macerated callus around the perimeter and light slough on the surface. The lateral leg wound measured a little bit larger but is also fairly clean with eschar and minimal slough. 04/02/2022: The patient had vascular studies done last Friday and so his cast was not applied. He is here today to have that done. 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 04/05/2022: Both wounds are  smaller and quite clean. There is just a little biofilm on the lateral leg wound. 10/20; the patient has a wound on the left lateral surgical incision at the level of his lateral knee this looks clean and improved. He is using silver alginate. He also has an area on his left medial foot for which she is using Hydrofera Blue under a total contact cast both wounds are measuring smaller 04/20/2022: The plantar foot wound has contracted considerably and is very close to closing. The lateral leg wound was measured a little larger, but there was a tiny open area that was included in the measurements that was not included last week. He has some eschar around the perimeter but otherwise the wound looks clean. 04/27/2022: The lateral leg wound looks better this week. He says that midweek, he felt it was very dry and began applying hydrogel to the site. I think this was beneficial. The foot wound is nearly closed underneath a thick layer of dry skin and callus. 05/04/2022: The foot wound is healed. He has developed a new small ulcer on his anterior tibial surface about midway up his leg. It has a little slough on the surface. The lateral leg wound still is fairly dry, but clean with just a little biofilm on the surface. 05/11/2022: The wound on his foot reopened on Wednesday. A large blister formed which then broke open revealing the fat layer underneath. The ulcer on his anterior tibial surface is a little bit larger this week. The lateral leg wound  has much better moisture balance this week. Fortunately, prior to his foot wound reopening, he did get the cast made for his orthotic. 05/15/2022: Already, the left medial foot wound has improved. The tissue is less macerated and the surface is clean. The ulcer on his anterior tibial surface continues to enlarge. This seems likely secondary to accumulated moisture. The lateral leg wound continues to have an improved moisture balance with the use of collagen. 05/25/2022: The medial foot wound continues to contract. It is now substantially smaller with just a little slough on the surface. The anterior tibial surface wound continues to enlarge further. Once again, this seems to be secondary to moisture. The lateral leg wound does not seem to be changing much in size, but the [name], [name] ([id number]) [id number] Page 14 of 19 moisture balance is better. 06/01/2022: The anterior tibial wound is closed. The medial foot wound is down to just a very small, couple of millimeters, opening. The lateral leg wound has good moisture balance, but remains unchanged in size. 12/15; the patient's anterior tibial wound has reopened, however the area on his right first metatarsal head is closed. The major wound is actually on the superior part of his surgical wound in the left lateral thigh. Not a completely viable surface under illumination. This may at some point require a debridement I think he is currently using Prisma. As noted the left medial foot wound has closed 06/14/2022: The anterior tibial wound has closed. The lateral leg wound has a better surface but is basically unchanged in size. The left medial foot wound has reopened. It looks as though there was some callus accumulation and moisture got under the callus which caused the tissue to break down again. 06/21/2022: A new wound has opened up just distal to the previous anterior tibial wound. It is small but has hypertrophic  granulation tissue present. The lateral leg wound is a little bit narrower and has a layer of slough on the  surface. The left medial foot wound is down to just a pinhole. His custom orthotics should be available next week. 06/28/2022: The wound on his first metatarsal head has healed. He has developed a new small wound on his medial lower leg, in an old scar site. The lateral leg wound continues to contract but continues to accumulate slough, as well. 07/03/2022: Despite wearing his custom orthopedic shoes, he managed to reopen the wound on his first metatarsal head. He says he thinks his foot got wet and then some skin lifted up and he peeled this away. Both of the lower leg wounds are smaller and have some dry eschar on the surface. The lateral leg wound is quite a bit narrower today. 07/12/2022: The medial lower leg wound is closed. The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

## 2022-10-04 ENCOUNTER — Encounter (HOSPITAL_BASED_OUTPATIENT_CLINIC_OR_DEPARTMENT_OTHER): Payer: Medicare Other | Admitting: General Surgery

## 2022-10-04 DIAGNOSIS — E11621 Type 2 diabetes mellitus with foot ulcer: Secondary | ICD-10-CM | POA: Diagnosis not present

## 2022-10-04 NOTE — Progress Notes (Signed)
[name], [name] ([id number]) [id number] Page 1 of 19 Visit Report for 10/04/2022 Chief Complaint Document Details Patient Name: Date of Service: [name], [name] 10/04/2022 10:45 A M Medical Record Number: [id number] Patient Account Number: [id number] Date of Birth/Sex: Treating RN: [birth year] (72 y.o. M) Primary Care Provider: [name] Other Clinician: Referring Provider: Treating Provider/Extender: [name] in Treatment: 77 Information Obtained from: Patient Chief Complaint Left leg and foot ulcers 04/12/2021; patient is here for wounds on his left lower leg and left plantar foot over the first metatarsal head Electronic Signature(s) Signed: 10/04/2022 11:27:33 AM By: [name] MD FACS Entered By: [name] on 10/04/2022 11:27:33 -------------------------------------------------------------------------------- Debridement Details Patient Name: Date of Service: [name]. 10/04/2022 10:45 A M Medical Record Number: [id number] Patient Account Number: [id number] Date of Birth/Sex: Treating RN: [birth year] (72 y.o. [name] Primary Care Provider: [name] Other Clinician: Referring Provider: Treating Provider/Extender: [name] in Treatment: 77 Debridement Performed for Assessment: Wound #18R Left,Plantar Metatarsal head first Performed By: Physician [name], MD Debridement Type: Debridement Severity of Tissue Pre Debridement: Fat layer exposed Level of Consciousness (Pre-procedure): Awake and Alert Pre-procedure Verification/Time Out Yes - 11:03 Taken: Start Time: 11:04 Pain Control: Lidocaine 4% T opical Solution T Area Debrided (L x [name]): otal 0.6 (cm) x 1.3 (cm) = 0.78 (cm) Tissue and other material debrided: Non-Viable, Callus, Slough, Slough Level: Non-Viable Tissue Debridement Description: Selective/Open Wound Instrument: Curette Bleeding:  Minimum Hemostasis Achieved: Pressure Response to Treatment: Procedure was tolerated well Level of Consciousness (Post- Awake and Alert procedure): Post Debridement Measurements of Total Wound Length: (cm) 0.6 Width: (cm) 1.3 Depth: (cm) 0.1 Volume: (cm) 0.061 Character of Wound/Ulcer Post Debridement: Stable Severity of Tissue Post Debridement: Fat layer exposed Post Procedure Diagnosis Same as Pre-procedure Notes Scribed for Dr. Lady Gary by Brenton Grills RN. Electronic Signature(s) Signed: 10/04/2022 11:56:29 AM By: [name] MD FACS Signed: 10/04/2022 3:58:28 PM By: Waverly Ferrari ([id number]) [id number] Page 2 of 19 Entered By: [name] on 10/04/2022 11:06:18 -------------------------------------------------------------------------------- Debridement Details Patient Name: Date of Service: [name], [name] 10/04/2022 10:45 A M Medical Record Number: [id number] Patient Account Number: [id number] Date of Birth/Sex: Treating RN: [birth year] (72 y.o. [name] Primary Care Provider: [name] Other Clinician: Referring Provider: Treating Provider/Extender: [name] in Treatment: 77 Debridement Performed for Assessment: Wound #22 Left,Proximal,Lateral Lower Leg Performed By: Physician [name], MD Debridement Type: Debridement Level of Consciousness (Pre-procedure): Awake and Alert Pre-procedure Verification/Time Out Yes - 11:03 Taken: Start Time: 11:04 Pain Control: Lidocaine 4% T opical Solution T Area Debrided (L x [name]): otal 4 (cm) x 1 (cm) = 4 (cm) Tissue and other material debrided: Non-Viable, Slough, Slough Level: Non-Viable Tissue Debridement Description: Selective/Open Wound Instrument: Curette Bleeding: Minimum Hemostasis Achieved: Pressure Response to Treatment: Procedure was tolerated well Level of Consciousness (Post- Awake and  Alert procedure): Post Debridement Measurements of Total Wound Length: (cm) 4 Width: (cm) 1 Depth: (cm) 0.1 Volume: (cm) 0.314 Character of Wound/Ulcer Post Debridement: Stable Post Procedure Diagnosis Same as Pre-procedure Notes Scribed for Dr. Lady Gary by Brenton Grills RN. Electronic Signature(s) Signed: 10/04/2022 11:56:29 AM By: [name] MD FACS Signed: 10/04/2022 3:58:28 PM By: [name] Entered By: [name] on 10/04/2022 11:06:41 -------------------------------------------------------------------------------- HPI Details Patient Name: Date of Service: [name]. 10/04/2022 10:45 A M Medical Record Number: [id number] Patient Account Number: [id number] Date of Birth/Sex: Treating RN: 02/18/1951 (71 y.o.  M) Primary Care Provider: [name] Other Clinician: Referring Provider: Treating Provider/Extender: [name] in Treatment: 53 History of Present Illness HPI Description: 10/11/17; [name] is a 72 year old man who tells me that in 2015 he slipped down the latter traumatizing his left leg. He developed a wound in the same spot the area that we are currently looking at. He states this closed over for the most part although he always felt it was somewhat unstable. In 2016 he hit the same area with the door of his car had this reopened. He tells me that this is never really closed although sometimes an inflow it remains open on a constant basis. He has not been using any specific dressing to this except for topical antibiotics the nature of which were not really sure. His primary doctor did send him to see Dr. Jacinto Halim of interventional cardiology. He underwent an angiogram on 08/06/17 and he underwent a PTA and directional atherectomy of the lesser distal SFA and popliteal arteries which resulted in brisk improvement in blood flow. It was noted that he had 2 vessel runoff through the anterior tibial and peroneal. He is also been  to see vascular and interventional radiologist. He was not felt to have any significant superficial venous insufficiency. Presumably is not a candidate for any ablation. It was suggested he come here for wound care. The patient is a type II diabetic on insulin. He also has a history of venous insufficiency. [name], [name] ([id number]) [id number] Page 3 of 19 ABIs on the left were noncompressible in our clinic 10/21/17; patient we admitted to the clinic last week. 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 We put Iodosorb on him after an aggressive debridement and 3 layer compression. He complained of pain in his ankle and itching with is skin in fact he scratched the area on the medial calf superiorly at the rim of our wraps and he has 2 small open areas in that location today which are new. I changed his primary dressing today to silver collagen. As noted he is already had revascularization and does not have any significant superficial venous insufficiency that would be amenable to ablation 10/28/17; patient admitted to the clinic 2 weeks ago. He has a smaller Wound. Scratch injury from last week revealed. There is large wound over the tibial area. This is smaller. Granulation looks healthy. No need for debridement. 11/04/17; the wound on the left lateral calf looks better. Improved dimensions. Surface of this looks better. We've been maintaining him and Kerlix Coban wraps. He finds this much more comfortable. Silver collagen dressing 11/11/17; left lateral Wound continues to look healthy be making progress. Using a #5 curet I removed removed nonviable skin from the surface of the wound and then necrotic debris from the wound surface. Surface of the wound continues to look healthy. He also has an open area on the left great toenail bed. We've been using topical antibiotics. 11/19/17; left anterior lateral wound  continues to look healthy but it's not closed. He also had a small wound above this on the left leg Initially traumatic wounds in the setting of significant chronic venous insufficiency and stasis dermatitis 11/25/17; left anterior wounds superiorly is closed still a small wound inferiorly. 12/02/17; left anterior tibial area. Arrives today with adherent callus. Post debridement clearly not completely closed. Hydrofera Blue under 3 layer compression. 12/09/17; left anterior tibia. Circumferential eschar however the wound  bed looks stable to improved. We've been using Hydrofera Blue under 3 layer compression 12/17/17; left anterior tibia. Apparently this was felt to be closed however when the wrap was taken off there is a skin tear to reopen wounds in the same area we've been using Hydrofera Blue under 3 layer compression 12/23/17 left anterior tibia. Not close to close this week apparently the [facility] was stuck to this again. Still circumferential eschar requiring debridement. I put a contact layer on this this time under the Hydrofera Blue 12/31/17; left anterior tibia. Wound is better slight amount of hyper-granulation. Using Hydrofera Blue over Adaptic. 01/07/18; left anterior tibia. The wound had some surface eschar however after this was removed he has no open wound.he was already revascularized by Dr. Jacinto Halim when he came to our clinic with atherectomy of the left SFA and popliteal artery. He was also sent to interventional radiology for venous reflux studies. He was not felt to have significant reflux but certainly has chronic venous changes of his skin with hemosiderin deposition around this area. He will definitely need to lubricate his skin and wear compression stocking and I've talked to him about this. READMISSION 05/26/2018 This is a now 72 year old man we cared for with traumatic wounds on his left anterior lower extremity. He had been previously revascularized during that admission by Dr.  Jacinto Halim. Apparently in follow-up Dr. Jacinto Halim noted that he had deterioration in his arterial status. He underwent a stent placement in the distal left SFA on 04/22/2018. 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 He went back to the angiography suite on 04/30/2018 he underwent PTA and balloon angioplasty of the occluded left mid anterior tibial artery, thrombotic occlusion went from 100 to 0% which reconstitutes the posterior tibial artery. He had thrombectomy and aspiration of the peroneal artery. The stent placed in the distal SFA left SFA was still occluded. 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 He has been recuperating at [facility] in [facility] after the most recent hospitalization. He is going home tomorrow. He tells me that 2 weeks ago he traumatized the tip of his left fifth toe. He came in urgently for our review of this. This was a history of before I noted that Dr. Jacinto Halim had already noted dry gangrenous changes of the left fifth toe 06/09/2018; 2-week follow-up. I did contact Dr. Jacinto Halim after his last appointment and he apparently saw 1 of Dr. Verl Dicker colleagues the next day. He does not follow-up with Dr. Jacinto Halim himself until Thursday of this week. 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 Nevertheless there is no evidence of infection no drainage and no pain. He had a new area that this week when we were signing him in today on the left anterior mid tibia area, this is in close proximity to the previous wound we have dealt with in this clinic. 06/23/2018; 2-week follow-up. I did not receive a recent note from Dr. Jacinto Halim to review today. Our office is trying to obtain this. He is apparently not  planning to do further vascular interventions and wondered about compression to try and help with the patient's chronic venous insufficiency. However we are also concerned about the arterial flow.  He arrives in clinic today with a new area on the left third toe. The areas on the calf/anterior tibia are close to closing. The left fifth toe is still mummified using Betadine. -In reviewing things with the patient he has what sounds like claudication with mild to moderate amount of activity. 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 I prescribed Levaquin over the phone while we attempted to arrange a plan of care. However the patient called yesterday to report he had low-grade fever and he came in today acutely. There is been a marked deterioration in the left third toe with spreading cellulitis up into the dorsal left foot. He was referred to the emergency room. Readmission: 06/29/2020 patient presents today for reevaluation here in our clinic he was previously treated by Dr. Dellia Nims at the latter part of 2019 in 2 the beginning of 2020. Subsequently we have not seen him since that time in the interim he did have evaluation with vein and vascular specialist specifically Dr. Anice Paganini who did perform quite extensive work for a left femoral to anterior tibial artery bypass. With that being said in the interim the patient has developed significant lymphedema and has wounds that he tells me have really never healed in regard to the incision site on the left leg. He also has multiple wounds on the feet for various reasons some of which is that he tends to pick at his feet. Fortunately there is no signs of active infection systemically at this time he does have some wounds that are little bit deeper but most are fairly superficial he seems to have good blood flow and overall everything appears to be healthy I see no bone exposed and no obvious signs of osteomyelitis. I do not  know that he necessarily needs a x-ray at this point although that something we could consider depending on how things progress. The patient does have a history of lymphedema, diabetes, this is type II, chronic kidney disease stage III, hypertension, and history of peripheral vascular disease. 07/05/2020; patient admitted last week. Is a patient I remember from 2019 he had a spreading infection involving the left foot and we sent him to the hospital. 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 He subsequently had a left femoral to anterior tibial bypass by Dr.Cain vein and vascular. He also has severe lymphedema with chronic skin changes related to that on the left leg. The most problematic area that was new today was on the left medial great toe. This was apparently a small area last week there was purulent drainage which our intake nurse cultured. Also areas on the left medial foot and heel left lateral foot. He has 2 areas on the left medial calf left lateral calf in the setting of the severe lymphedema. 07/13/2020 on evaluation today patient appears to be doing better in my opinion compared to his last visit. The good news is there is no signs of active infection systemically and locally I do not see any signs of infection either. He did have an x-ray which was negative that is great news he had a culture which showed MRSA but at the same time he is been on the doxycycline which has helped. I do think we may want to extend this for 7 additional days 1/25; patient admitted to the clinic a few weeks ago. 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 We have been putting him under compression his edema control is a lot better but he is severe verricused skin on the left leg. He is really done quite well he still has an open area on the left medial calf and the left medial first metatarsal head. We have been using silver  collagen on the leg silver alginate on the foot 07/27/2020 upon evaluation today patient appears to be doing decently well in regard to his wounds. He still has a lot of dry skin on the left leg. Some of this is starting to peel back and I think he may be able to have them out by removing some that today. Fortunately there is no signs of active infection at this time on the left leg although on the right leg he does appear to have swelling and erythema as well as some mild warmth to touch. This does have been concerned about the possibility of cellulitis although within the differential diagnosis I do think that potentially a DVT has to be at least considered. We need to rule that out before proceeding would just call in the cellulitis. Especially since he is having pain in the posterior aspect of his calf muscle. [name], [name] ([id number]) [id number] Page 4 of 19 2/8; the patient had seen sparingly. He has severe skin changes of chronic lymphedema in the left leg thickened hyperkeratotic verrucous skin. He has an open wound on the medial part of the left first met head left mid tibia. He also has a rim of nonepithelialized skin in the anterior mid tibia. He brought in the AmLactin lotion that was been prescribed although I am not sure under compression and its utility. There concern about cellulitis on the right lower leg the last time he was here. He was put on on antibiotics. His DVT rule out was negative. The right leg looks fine he is using his stocking on this area 08/10/2020 upon evaluation today patient appears to be doing well with regard to his leg currently. He has been tolerating the dressing changes without complication. Fortunately there is no signs of active infection which is great news. Overall very pleased with where things stand. 2/22; the patient still has an area on the medial part of the left first met his head. This looks better than when I last saw this  earlier this month he has a rim of epithelialization but still some surface debris. Mostly everything on the left leg is healed. There is still a vulnerable in the left mid tibia area. 08/30/2020 upon evaluation today patient appears to be doing much better in regard to his wounds on his foot. 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 Nonetheless I think we will address that today I Minna send in a prescription for him in that regard. Overall though there does not appear to be any signs of significant worsening. 09/07/2020 on evaluation today patient's wounds over his left foot appear to be doing excellent. I do not see any signs of infection there is some callus buildup this can require debridement for certain but overall I feel like he is managing quite nicely. He still using the AmLactin cream which has been beneficial for him as well. 3/22; left foot wound is closed. There is no open area here. He is using ammonium lactate lotion to the lower extremities to help exfoliate  dry cracked skin. He has compression stockings from elastic therapy in [place]. The wound on the medial part of his left first met head is healed today. READMISSION 04/12/2021 Mr. Tarazon is a patient we know fairly well he had a prolonged stay in clinic in 2019 with wounds on his left lateral and left anterior lower extremity in the setting of chronic venous insufficiency. More recently he was here earlier this year with predominantly an area on his left foot first metatarsal head plantar and he says the plantar foot broke down on its not long after we discharged him but he did not come back here. The last few months areas of broken down on his left anterior and again the left lateral lower extremity. The leg itself is very swollen chronically enlarged a lot of hyperkeratotic dry Berry Q skin in  the left lower leg. His edema extends well into the thigh. He was seen by Dr. Donzetta Matters. He had ABIs on 03/02/2021 showing an ABI on the right of 1 with a TBI of 0.72 his ABI in the left at 1.09 TBI of 0.99. Monophasic and biphasic waveforms on the right. On the left monophasic waveforms were noted he went on to have an angiogram on 03/27/2021 this showed the aortic aortic and iliac segments were free of flow-limiting stenosis the left common femoral vein to evaluate the left femoral to anterior tibial artery bypass was unobstructed the bypass was patent without any areas of stenosis. We discharged the patient in bilateral juxta lite stockings but very clearly that was not sufficient to control the swelling and maintain skin integrity. He is clearly going to need compression pumps. 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 This is fortunate because he is on his feet for long periods of time. 10/27; patient comes in with our intake nurse reporting copious amount of green drainage from the left anterior mid tibia the left dorsal foot and to a lesser extent the left medial mid tibia. We left the compression wrap on all week for the amount of edema in his left leg is quite a bit better. We use silver alginate as the primary dressing 11/3; edema control is good. Left anterior lower leg left medial lower leg and the plantar first metatarsal head. The left anterior lower leg required debridement. Deep tissue culture I did of this wound showed MRSA I put him on 10 days of doxycycline which she will start today. We have him in compression wraps. He has a security card and [employer] however he is retiring on November 15. We will need to then get him into a better offloading boot for the left foot perhaps a total contact cast 11/10; edema control is quite good. Left anterior and left medial lower leg wounds in the setting of chronic venous insufficiency and lymphedema.  He also has a substantial area over the left plantar first metatarsal head. 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 He has significant hypergranulation on the left plantar foot wound. The patient is a diabetic but he does not have significant PAD 11/17; edema control is quite good. Left anterior and left medial lower leg wounds look better. The really concerning area remains the area on the left plantar first metatarsal head. He has a rim of epithelialization. He has been using a surgical shoe The  patient is now retired from a a [employer] I have gone over with him the need to offload this area aggressively. Starting today with a forefoot off loader but . possibly a total contact cast. 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 Arrives with a very significant odor from I think all of the wounds on the left leg and the left foot. Because of this I did not put a total contact cast on him today but will could still consider this. His wife was having cataract surgery which is the reason he missed the appointment 12/6. I saw this man 5 days ago with a swelling below the popliteal fossa. I thought he actually might have a Baker's cyst however the DVT rule out study that we could arrange right away was negative the technician told me this was not a ruptured Baker's cyst. We attempted to get this aspirated by under ultrasound guidance in interventional radiology however all they did was an ultrasound however it shows an extensive fluid collection 62 x 8 x 9.4 in the left thigh and left calf. The patient states he thinks this started 8 days ago or so but he really is not complaining of any pain, fever or systemic symptoms. He has not ha 12/20; after some difficulty I managed to get the patient into see  Dr. Donzetta Matters. Eventually he was taken into the hospital and had a drain put in the fluid collection below his left knee posteriorly extending into the posterior thigh. He still has the drain in place. Culture of this showed moderate staff aureus few Morganella and few Klebsiella he is now on doxycycline and ciprofloxacin as suggested by infectious disease he is on this for a month. The drain will remain in place until it stops draining 12/29; he comes in today with the 1 wound on his left leg and the area on the left plantar first met head significantly smaller. Both look healthy. He still has the drain in the left leg. He says he has to change this daily. Follows up with Dr. Donzetta Matters on January 11. 06/29/2021; the wounds that I am following on the left leg and left first met head continued to be quite healthy. However the area where his inferior drain is in place had copious amounts of drainage which was green in color. The wound here is larger. Follows up with Dr. Gwenlyn Saran of vein and vascular his surgeon next week as well as infectious disease. He remains on ciprofloxacin and doxycycline. He is not complaining of excessive pain in either one of the drain areas 1/12; the patient saw vascular surgery and infectious disease. Vascular surgery has left the drain in place as there was still some notable drainage still see him back in 2 weeks. Dr. Velna Ochs stop the doxycycline and ciprofloxacin and I do not believe he follows up with them at this point. Culture I did last week showed both doxycycline resistant MRSA and Pseudomonas not sensitive to ciprofloxacin although only in rare titers 1/19; the patient's wound on the left anterior lower leg is just about healed. We have continued healing of the area that was medially on the left leg. Left first plantar metatarsal head continues to get smaller. The major problem here is his 2 drain sites 1 on the left upper calf and lateral thigh. There is purulent drainage still from  the  left lateral thigh. I gave him antibiotics last week but we still have recultured. He has the drain in the area I think this is eventually going to have to come out. I suspect there will be a connecting wound to heal here perhaps with improved [name], [name] ([id number]) [id number] Page 5 of 19 1/26; the patient had his drain removed by vein and vascular on 1/25/. This was a large pocket of fluid in his left thigh that seem to tunnel into his left upper calf. 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 His mention his Penrose drain was removed today. He now has a tunneling wound on his left calf and left thigh. Both of these probe widely towards each other although I cannot really prove that they connect. Both wounds on his lower leg anteriorly are closed and his area over the first metatarsal head on his right foot continues to improve. We are using Hydrofera Blue here. He also saw infectious disease culture of the abscess they noted was polymicrobial with MRSA, Morganella and Klebsiella he was treated with doxycycline and ciprofloxacin for 4 weeks ending on 07/03/2021. They did not recommend any further antibiotics. Notable that while he still had the Penrose drain in place last week he had purulent drainage coming out of the inferior IandD site this grew [facility], MRSA and Pseudomonas but there does not appear to be any active infection in this area today with the drain out and he is not systemically unwell 2/2; with regards to the drain sites the superior one on the thigh actually is closed down the one on the upper left lateral calf measures about 8 and half centimeters which is an improvement seems to be less prominent although still with a lot of drainage. The only remaining wound is over the first metatarsal head on the left foot and this looks to be continuing to improve with Hydrofera Blue. 2/9; the area on his plantar left foot  continues to contract. Callus around the wound edge. The drain sites specifically have not come down in depth. We put the wound VAC on Monday he changed the canister late last night our intake nurse reported a pocket of fluid perhaps caused by our compression wraps 2/16; continued improvement in left foot plantar wound. drainage site in the calf is not improved in terms of depth (wound vac) 2/23; continued improvement in the left foot wound over the first metatarsal head. With regards to the drain sites the area on his thigh laterally is healed however the open area on his calf is small in terms of circumference by still probes in by about 15 cm. Within using the wound VAC. Hydrofera Blue on his foot 08/24/2021: The left first metatarsal head wound continues to improve. The wound bed is healthy with just some surrounding callus. Unfortunately the open drain site on his calf remains open and tunnels at least 15 cm (the extent of a Q-tip). This is despite several weeks of wound VAC treatment. Based on reading back through the notes, there has been really no significant change in the depth of the wound, although the orifice is smaller and the more cranial wound on his thigh has closed. I suspect the tunnel tracks nearly all the way to this location. 08/31/2021: Continued improvement in the left first metatarsal head wound. There has been absolutely no improvement to the long tunnel from his open drain site on his calf. We have tried to get him into see  vascular surgery sooner to consider the possibility of simply filleting the tract open and allowing it to heal from the bottom up, likely with a wound VAC. They have not yet scheduled a sooner appointment than his current mid April 09/14/2021: He was seen by vascular surgery and they took him to the operating room last week. They opened a portion of the tunnel, but did not extend the entire length of the known open subcutaneous tract. I read Dr. Claretha Cooper  operative note and it is not clear from that documentation why only a portion of the tract was opened. The heaped up granulation tissue was curetted and removed from at least some portion of the tract. They did place a wound VAC and applied an Unna boot to the leg. The ulcer on his left first metatarsal head is smaller today. The bed looks good and there is just a small amount of surrounding callus. 09/21/2021: The ulcer on his left first metatarsal head looks to be stalled. There is some callus surrounding the wound but the wound bed itself does not appear particularly dynamic. The tunnel tract on his lateral left leg seems to be roughly the same length or perhaps slightly smaller but the wound bed appears healthy with good granulation tissue. He opened up a new wound on his medial thigh and the site of a prior surgical incision. He says that he did this unconsciously in his sleep by scratching. 09/28/2021: Unfortunately, the ulcer on his left first metatarsal head has extended underneath the callus toward the dorsum of his foot. The medial thigh wounds are roughly the same. The tunnel on his lateral left leg continues to be problematic; it is longer than we are able to actually probe with a Q-tip. I am still not certain as to why Dr. Donzetta Matters did not open this up entirely when he took the patient to the operating room. We will likely be back in the same situation with just a small superficial opening in a long unhealed tract, as the open portion is granulating in nicely. 10/02/2021: The patient was initially scheduled for a nurse visit, but we are also applying a total contact cast today. The plantar foot wound looks clean without significant accumulated callus. We have been applying Prisma silver collagen to the site. 10/05/2021: The patient is here for his first total contact cast change. We have tried using gauze packing strips in the tunnel on his lateral leg wound, but this does not seem to be working  any better than the white VAC foam. The foot ulcer looks about the same with minimal periwound callus. Medial thigh wound is clean with just some overlying eschar. 10/12/2021: The plantar foot wound is stable without any significant accumulation of periwound callus. The surface is viable with good granulation tissue. The medial thigh wounds are much smaller and are epithelializing. On the other hand, he had purulent drainage coming from the tunnel on his lateral leg. He does go back to see Dr. Donzetta Matters next week and is planning to ask him why the wound tunnel was not completely opened at the time of his most recent operation. 10/19/2021: The plantar foot wound is markedly improved and has epithelial tissue coming through the surface. The medial thigh wounds are nearly closed with just a tiny open area. He did see Dr. Donzetta Matters earlier this week and apparently they did discuss the possibility of opening the sinus tract further and enabling a wound VAC application. Apparently there are some limits as to what  Dr. 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 I think if we could get the tract open to the level of the popliteal fossa, this would greatly aid in her ability to get this chart closed. That being said, however, today when I probed the tract with a Q-tip, I was not able to insert the entirety of the Q-tip as I have on previous occasions. The tunnel is shorter by about 4 cm. The surface is clean with good granulation tissue and no further episodes of purulent drainage. 10/30/2021: Last week, the patient underwent surgery and had the long tract in his leg opened. There was a rind that was debrided, according to the operative report. His medial thigh ulcers are closed. The plantar foot wound is clean with a good surface and some built up surrounding callus. 11/06/2021: The overall dimensions of the large wound on his lateral leg remain about the same, but there is good granulation  tissue present and the tunneling is a little bit shorter. He has a new wound on his anterior tibial surface, in the same location where he had a similar lesion in the past. The plantar foot wound is clean with some buildup surrounding callus. Just toward the medial aspect of his foot, however, there is an area of darkening that once debrided, revealed another opening in the skin surface. 11/13/2021: The anterior tibial surface wound is closed. The plantar foot wound has some surrounding callus buildup. The area of darkening that I debrided last week and revealed an opening in the skin surface has closed again. The tunnel in the large wound on his lateral leg has come in by about 3 cm. There is healthy granulation tissue on the entire wound surface. 11/23/2021: The patient was out of town last week and did wet-to-dry dressings on his large wound. 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 Unfortunately, he picked open the wound on his left medial thigh. He says that it was itching and he just could not stop scratching it until it was open again. The wound on his plantar foot is smaller and has not accumulated a tremendous amount of callus. The lateral leg wound is shallower and the tunnel has also decreased in depth. There is just a little bit of slough accumulation on the surface. 11/30/2021: Another portion of his left medial thigh has been opened up. All of these wounds are fairly superficial with just a little bit of slough and eschar accumulation. The wound on his plantar foot is almost closed with just a bit of eschar and periwound callus accumulation. The lateral leg wound is nearly flush with the surrounding skin and the tunnel is markedly shallower. 12/07/2021: There is just 1 open area on his left medial thigh. It is clean with just a little bit of perimeter eschar. The wound on his plantar foot continues to contract and just has  some eschar and periwound callus accumulation. The lateral leg wound is closing at the more distal aspect and the tunnel is smaller. The surface is nearly flush with the surrounding skin and it has a good bed of granulation tissue. 12/14/2021: The thigh and foot wounds are closed. The lateral leg wound has closed over approximately half of its length. The tunnel continues to contract and the surface is now flush with the surrounding skin. The wound bed has robust granulation tissue. [name], [name] ([id number]) [id number] Page 6 of 19 12/22/2021:  The thigh and foot wounds have reopened. The foot wound has a lot of callus accumulation around and over it. The thigh wound is tiny with just a little bit of slough in the wound bed. The lateral leg wound continues to contract. His vascular surgeon took the wound VAC off earlier in the week and the patient has been doing wet-to-dry dressings. There is a little slough accumulation on the surface. The tunnel is about 3 cm in depth at this point. 12/28/2021: The thigh wound is closed again. The foot wound has some callus that subsequently has peeled back exposing just a small slit of a wound. The lateral leg wound Is down to about half the size that it originally was and the tunnel is down to about half a centimeter in depth. 01/04/2022: The thigh wound remains closed. The foot wound has heavy callus overlying the wound site. Once this was debrided, the wound was found to be closed. The lateral leg wound is smaller again this week and very superficial. No tunnel could be identified. 01/12/2022: The thigh and foot wounds both remain closed. The lateral leg wound is now nearly flush with the skin surface. There is good granulation tissue present with a light layer of slough. 01/19/2022: Due to the way his wrap was placed, the patient did not change the dressing on his thigh at all and so the foam was saturated and his skin is macerated. There  is a light layer of slough on the wound surface. The underlying granulation tissue is robust and healthy-appearing. He has heavy callus buildup at the site of his first metatarsal head wound which is still healed. 02/01/2022: He has been in silver alginate. When he removed the dressing from his thigh wound, however, some leg, superficially reopening a portion of the wound that had healed. In addition, underneath the callus at his left first metatarsal head, there appears to be a blister and the wound appears to be open again. 02/08/2022: The lateral leg wound has contracted substantially. There is eschar and a light layer of slough present. He says that it is starting to pull and is uncomfortable. On inspection, there is some puckering of the scar and the eschar is quite dry; this may account for his symptoms. On his first metatarsal head, the wound is much smaller with just some eschar on the surface. The callus has not reaccumulated. He reports that he had a blister come up on his medial thigh wound at the distal aspect. It popped and there is now an opening in his skin again. Looking back through his [place] of wound photos, there is what looks like a permanent suture just deep to this location and it may be trying to erode through. We have been using silver alginate on his wounds. 02/15/2022: The lateral leg wound is about half the size it was last week. It is clean with just a little perimeter eschar and light slough. The wound on his first metatarsal head is about the same with heavy callus overlying it. The medial thigh wound is closed again. He does have some skin changes on the top of his foot that looks potentially yeast related. 02/22/2022: The skin on the top of his foot improved with the use of a topical antifungal. The lateral leg wound continues to contract and is again smaller this week. There is a little bit of slough and eschar on the surface. The first metatarsal head wound is a little  bit smaller but has reaccumulated a  thick callus over the top. He decided to try to trim his toenail and ultimately took the entire nail off of his left great toe. 03/02/2022: His lateral leg wound continues to improve, as does the wound on his left great toe. Unfortunately, it appears that somehow his foot got wet and moisture seeped in through the opening causing his skin to lift. There is a large wound now overlying his first metatarsal on both the plantar, medial, and dorsal portion of his foot. There is necrotic tissue and slough present underneath the shaggy macerated skin. 03/08/2022: The lateral leg wound is smaller again today. There is just a light layer of slough and eschar on the surface. The great toe wound is smaller again today. The first metatarsal wound is a little bit smaller today and does not look nearly as necrotic and macerated. There is still slough and nonviable tissue present. 03/15/2022: The lateral leg wound is narrower and just has a little bit of light slough buildup. The first metatarsal wound still has a fair amount of moisture affecting the periwound skin. The great toe wound is healed. 03/22/2022: The lateral leg wound is now isolated to just at the level of his knee. There is some eschar and slough accumulation. The first metatarsal head wound has epithelialized tremendously and is about half the size that it was last week. He still has some maceration on the top of his foot and a fungal odor is present. 03/29/2022: T oday the patient's foot was macerated, suggesting that the cast got wet. The patient has also been picking at his dry skin and has enlarged the wound on his left lateral leg. In the time between having his cast removed and my evaluation, he had picked more dry skin and opened up additional wounds on his Achilles area and dorsal foot. The plantar first metatarsal head wound, however, is smaller and clean with just macerated callus around the perimeter  and light slough on the surface. The lateral leg wound measured a little bit larger but is also fairly clean with eschar and minimal slough. 04/02/2022: The patient had vascular studies done last Friday and so his cast was not applied. He is here today to have that done. 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 04/05/2022: Both wounds are smaller and quite clean. There is just a little biofilm on the lateral leg wound. 10/20; the patient has a wound on the left lateral surgical incision at the level of his lateral knee this looks clean and improved. He is using silver alginate. He also has an area on his left medial foot for which she is using Hydrofera Blue under a total contact cast both wounds are measuring smaller 04/20/2022: The plantar foot wound has contracted considerably and is very close to closing. The lateral leg wound was measured a little larger, but there was a tiny open area that was included in the measurements that was not included last week. He has some eschar around the perimeter but otherwise the wound looks clean. 04/27/2022: The lateral leg wound looks better this week. He says that midweek, he felt it was very dry and began applying hydrogel to the site. I think this was beneficial. The foot wound is nearly closed underneath a thick layer of dry skin and callus. 05/04/2022: The foot wound is healed. He has developed a new small ulcer on his anterior tibial surface about midway up his leg. It has a little slough on the surface. The lateral leg  wound still is fairly dry, but clean with just a little biofilm on the surface. 05/11/2022: The wound on his foot reopened on Wednesday. A large blister formed which then broke open revealing the fat layer underneath. The ulcer on his anterior tibial surface is a little bit larger this week. The lateral leg wound has much better moisture balance this week. Fortunately, prior to his foot wound reopening, he did get the cast made  for his orthotic. 05/15/2022: Already, the left medial foot wound has improved. The tissue is less macerated and the surface is clean. The ulcer on his anterior tibial surface continues to enlarge. This seems likely secondary to accumulated moisture. The lateral leg wound continues to have an improved moisture balance with the use of collagen. 05/25/2022: The medial foot wound continues to contract. It is now substantially smaller with just a little slough on the surface. The anterior tibial surface wound continues to enlarge further. Once again, this seems to be secondary to moisture. The lateral leg wound does not seem to be changing much in size, but the moisture balance is better. 06/01/2022: The anterior tibial wound is closed. The medial foot wound is down to just a very small, couple of millimeters, opening. The lateral leg wound has good moisture balance, but remains unchanged in size. 12/15; the patient's anterior tibial wound has reopened, however the area on his right first metatarsal head is closed. The major wound is actually on the superior part of his surgical wound in the left lateral thigh. Not a completely viable surface under illumination. This may at some point require a debridement I think he is currently using Prisma. As noted the left medial foot wound has closed [name], [name] ([id number]) [id number] Page 7 of 19 06/14/2022: The anterior tibial wound has closed. The lateral leg wound has a better surface but is basically unchanged in size. The left medial foot wound has reopened. It looks as though there was some callus accumulation and moisture got under the callus which caused the tissue to break down again. 06/21/2022: A new wound has opened up just distal to the previous anterior tibial wound. It is small but has hypertrophic granulation tissue present. The lateral leg wound is a little bit narrower and has a layer of slough on the surface. The  left medial foot wound is down to just a pinhole. His custom orthotics should be available next week. 06/28/2022: The wound on his first metatarsal head has healed. He has developed a new small wound on his medial lower leg, in an old scar site. The lateral leg wound continues to contract but continues to accumulate slough, as well. 07/03/2022: Despite wearing his custom orthopedic shoes, he managed to reopen the wound on his first metatarsal head. He says he thinks his foot got wet and then some skin lifted up and he peeled this away. Both of the lower leg wounds are smaller and have some dry eschar on the surface. The lateral leg wound is quite a bit narrower today. 07/12/2022: The medial lower leg wound is closed. The anterior lower leg wound has contracted considerably. The lateral upper leg wound is narrower with a layer of slough on the surface. The first metatarsal head wound is also smaller, but had copious drainage which saturated the foam border dressing and resulted in some periwound tissue maceration. Fortunately there was no breakdown at this site. 07/19/2022: The lower leg shows signs of significant maceration; I think he must be sweating excessively inside his  cast. There are several areas of skin breakdown present. The wound on his foot is smaller and that on his lateral leg is narrower and is shorter by about a centimeter. 07/26/2022: Last week we used a zinc Coflex wrap prior to applying his total contact cast and this has had the effect of keeping his skin from getting macerated this week. The anterior leg wound has epithelialized substantially. The lateral leg wound is significantly smaller with just a bit of slough on the surface. The first metatarsal head wound is also smaller this week. 08/02/2022: The anterior leg wound was closed on arrival, but while he was sitting in the room, he picked it open again. The lateral leg wound is smaller with just a little slough on the surface and the  first metatarsal head wound has contracted further, as well. 08/09/2022: The first metatarsal head wound is covered with callus. Underneath the callus, it is nearly completely closed. The lateral leg wound is smaller again this week. The anterior leg wound looks better, but he has such heavy buildup of old skin, that moisture is getting underneath this, becoming trapped, and causing the underlying skin to get macerated and open up. 08/16/2022: The first metatarsal head wound is closed. The lateral leg wound continues to contract and is quite a bit smaller again this week. There is just a small, superficial opening remaining on his anterior tibial surface. 08/23/2022: The first metatarsal head wound has, by some miracle, remained closed. The lateral leg wound is substantially smaller with multiple areas of epithelialization. The anterior tibial surface wound is also quite a bit smaller and very clean. 08/30/2022: Unfortunately, his first metatarsal head wound opened up again. It happened in the same fashion as it has on prior occasions. Moisture got under dried skin/callus and created a wound when he removed his sock, taking the skin with it. The anterior tibial surface has a thick shell of hyperkeratotic skin. This has been contributing to ongoing repeat wounding events as moisture gets underneath this and causes tissue breakdown. 3/15; patient presents for follow-up. His anterior left leg wound has healed. He still has the wound to the left lateral aspect and left first met head. We have been using silver alginate and endoform to these areas under [employer]. He has no issues or complaints today. He has been taking Augmentin and reports improvement to his symptoms to the left first met head. 09/13/2022: He has accumulated more thick dry skin in sheets on his lower leg. The lateral leg wound is about the same size and the left first metatarsal head wound is a little bit smaller. There is slough on both  surfaces. There is callus buildup around the foot wound. 09/20/2022: The lateral leg wound is a little bit narrower and the left first metatarsal head wound also seems to have contracted slightly. There is slough on both surfaces. He has a little skin breakdown on his anterior tibial surface. 09/27/2022: The lateral leg wound continues to contract and is quite clean. The first metatarsal head wound is also smaller. There is some perimeter callus and slough accumulation on the foot. The anterior tibial surface is closed. 10/04/2022: Both of his wounds are smaller today, particularly the first metatarsal head wound. Electronic Signature(s) Signed: 10/04/2022 11:28:09 AM By: [name] MD FACS Entered By: [name] on 10/04/2022 11:28:09 -------------------------------------------------------------------------------- Physical Exam Details Patient Name: Date of Service: [name]. 10/04/2022 10:45 A M Medical Record Number: [id number] Patient Account Number: [id number] Date of  Birth/Sex: Treating RN: [birth year] (72 y.o. M) Primary Care Provider: [name] Other Clinician: Referring Provider: Treating Provider/Extender: [name] in Treatment: 6 Constitutional Hypertensive, asymptomatic. . . . no acute distress. Respiratory Normal work of breathing on room air. Notes 10/04/2022: Both of his wounds are smaller today, particularly the first metatarsal head wound. Electronic Signature(s) ALVARO, AUNGST ([id number]) [id number] Page 8 of 19 Signed: 10/04/2022 11:29:24 AM By: [name] MD FACS Entered By: [name] on 10/04/2022 11:29:24 -------------------------------------------------------------------------------- Physician Orders Details Patient Name: Date of Service: [name]. 10/04/2022 10:45 A M Medical Record Number: [id number] Patient Account Number: [id number] Date of Birth/Sex: Treating  RN: 08/01/1950 (72 y.o. [name] Primary Care Provider: [name] Other Clinician: Referring Provider: Treating Provider/Extender: [name] in Treatment: 63 Verbal / Phone Orders: No Diagnosis Coding ICD-10 Coding Code Description L97.528 Non-pressure chronic ulcer of other part of left foot with other specified severity L85.9 Epidermal thickening, unspecified L97.828 Non-pressure chronic ulcer of other part of left lower leg with other specified severity I87.322 Chronic venous hypertension (idiopathic) with inflammation of left lower extremity E11.51 Type 2 diabetes mellitus with diabetic peripheral angiopathy without gangrene I89.0 Lymphedema, not elsewhere classified Follow-up Appointments ppointment in 1 week. - Dr Lady Gary - Room 2 Return A Anesthetic Wound #22 Left,Proximal,Lateral Lower Leg (In clinic) Topical Lidocaine 4% applied to wound bed Bathing/ Shower/ Hygiene May shower with protection but do not get wound dressing(s) wet. Protect dressing(s) with water repellant cover (for example, large plastic bag) or a cast cover and may then take shower. Edema Control - Lymphedema / SCD / Other Avoid standing for long periods of time. Patient to wear own compression stockings every day. - on right leg; Moisturize legs daily. Compression stocking or Garment 20-30 mm/Hg pressure to: - left leg daily Off-Loading Wound #18R Left,Plantar Metatarsal head first Open toe surgical shoe to: - Front off loader Left ft Other: - minimal weight bearing left foot Wound Treatment Wound #18R - Metatarsal head first Wound Laterality: Plantar, Left Cleanser: Soap and Water 1 x Per Week/30 Days Discharge Instructions: May shower and wash wound with dial antibacterial soap and water prior to dressing change. Cleanser: Wound Cleanser 1 x Per Week/30 Days Discharge Instructions: Cleanse the wound with wound cleanser prior to applying a clean dressing using  gauze sponges, not tissue or cotton balls. Prim Dressing: Sorbalgon [employer] Dressing 2x2 (in/in) 1 x Per Week/30 Days ary Discharge Instructions: Apply to wound bed as instructed Secondary Dressing: Optifoam Non-Adhesive Dressing, 4x4 in 1 x Per Week/30 Days Discharge Instructions: Apply over primary dressing as directed. Secondary Dressing: Zetuvit Plus 4x8 in 1 x Per Week/30 Days Discharge Instructions: Apply over primary dressing as directed. Compression Wrap: CoFlex Zinc Unna Boot, 4 x 6 (in/yd) 1 x Per Week/30 Days Discharge Instructions: Apply Coflex [employer]inc [employer] as directed. Wound #22 - Lower Leg Wound Laterality: Left, Lateral, Proximal Cleanser: Soap and Water 3 x Per Week/30 Days Discharge Instructions: May shower and wash wound with dial antibacterial soap and water prior to dressing change. [name], [name] ([id number]) [id number] Page 9 of 19 Cleanser: Wound Cleanser 3 x Per Week/30 Days Discharge Instructions: Cleanse the wound with wound cleanser prior to applying a clean dressing using gauze sponges, not tissue or cotton balls. Peri-Wound Care: Sween Lotion (Moisturizing lotion) 3 x Per Week/30 Days Discharge Instructions: Apply moisturizing lotion to the leg Topical: Skintegrity Hydrogel 4 (oz) 3 x Per Week/30 Days Discharge  Instructions: Apply hydrogel as directed Prim Dressing: Endoform 2x2 in 3 x Per Week/30 Days ary Discharge Instructions: Moisten with Hydrogel or saline Secondary Dressing: ABD Pad, 8x10 (Generic) 3 x Per Week/30 Days Discharge Instructions: Apply over primary dressing as directed. Secured With: Elastic Bandage 4 inch (ACE bandage) 3 x Per Week/30 Days Discharge Instructions: Secure with ACE bandage as directed. Electronic Signature(s) Signed: 10/04/2022 11:56:29 AM By: [name] MD FACS Entered By: [name] on 10/04/2022  11:29:41 -------------------------------------------------------------------------------- Problem List Details Patient Name: Date of Service: [name]. 10/04/2022 10:45 A M Medical Record Number: [id number] Patient Account Number: [id number] Date of Birth/Sex: Treating RN: [birth year] (72 y.o. M) Primary Care Provider: [name] Other Clinician: Referring Provider: Treating Provider/Extender: [name] in Treatment: 77 Active Problems ICD-10 Encounter Code Description Active Date MDM Diagnosis L97.528 Non-pressure chronic ulcer of other part of left foot with other specified 08/26/2022 No Yes severity L85.9 Epidermal thickening, unspecified 08/30/2022 No Yes L97.828 Non-pressure chronic ulcer of other part of left lower leg with other specified 04/12/2021 No Yes severity I87.322 Chronic venous hypertension (idiopathic) with inflammation of left lower 04/12/2021 No Yes extremity E11.51 Type 2 diabetes mellitus with diabetic peripheral angiopathy without gangrene 04/12/2021 No Yes I89.0 Lymphedema, not elsewhere classified 04/12/2021 No Yes Inactive Problems ICD-10 Code Description Active Date Inactive Date E11.621 Type 2 diabetes mellitus with foot ulcer 04/12/2021 04/12/2021 [name] Octave ([id number]) [id number] Page 10 of [phone] Non-pressure chronic ulcer of other part of left lower leg with other specified severity 06/08/2022 06/08/2022 E11.42 Type 2 diabetes mellitus with diabetic polyneuropathy 04/12/2021 04/12/2021 L02.416 Cutaneous abscess of left lower limb 06/13/2021 06/13/2021 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 07/20/2021 07/20/2021 Resolved Problems Electronic Signature(s) Signed: 10/04/2022 11:25:51 AM By: [name] MD FACS Entered By: [name] on 10/04/2022  11:25:50 -------------------------------------------------------------------------------- Progress Note Details Patient Name: Date of Service: [name]. 10/04/2022 10:45 A M Medical Record Number: [id number] Patient Account Number: [id number] Date of Birth/Sex: Treating RN: 11-Sep-1950 (72 y.o. M) Primary Care Provider: [name] Other Clinician: Referring Provider: Treating Provider/Extender: [name] in Treatment: 28 Subjective Chief Complaint Information obtained from Patient Left leg and foot ulcers 04/12/2021; patient is here for wounds on his left lower leg and left plantar foot over the first metatarsal head History of Present Illness (HPI) 10/11/17; Mr. Beichner is a 72 year old man who tells me that in 2015 he slipped down the latter traumatizing his left leg. He developed a wound in the same spot the area that we are currently looking at. He states this closed over for the most part although he always felt it was somewhat unstable. In 2016 he hit the same area with the door of his car had this reopened. He tells me that this is never really closed although sometimes an inflow it remains open on a constant basis. He has not been using any specific dressing to this except for topical antibiotics the nature of which were not really sure. His primary doctor did send him to see Dr. Jacinto Halim of interventional cardiology. He underwent an angiogram on 08/06/17 and he underwent a PTA and directional atherectomy of the lesser distal SFA and popliteal arteries which resulted in brisk improvement in blood flow. It was noted that he had 2 vessel runoff through the anterior tibial and peroneal. He is also been to see vascular and interventional radiologist. He was not felt to have any significant  superficial venous insufficiency. Presumably is not a candidate for any ablation. It was suggested he come here for wound care. The patient is a type II diabetic on insulin. He  also has a history of venous insufficiency. ABIs on the left were noncompressible in our clinic 10/21/17; patient we admitted to the clinic last week. 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 We put Iodosorb on him after an aggressive debridement and 3 layer compression. He complained of pain in his ankle and itching with is skin in fact he scratched the area on the medial calf superiorly at the rim of our wraps and he has 2 small open areas in that location today which are new. I changed his primary dressing today to silver collagen. As noted he is already had revascularization and does not have any significant superficial venous insufficiency that would be amenable to ablation 10/28/17; patient admitted to the clinic 2 weeks ago. He has a smaller Wound. Scratch injury from last week revealed. There is large wound over the tibial area. This is smaller. Granulation looks healthy. No need for debridement. 11/04/17; the wound on the left lateral calf looks better. Improved dimensions. Surface of this looks better. We've been maintaining him and Kerlix Coban wraps. He finds this much more comfortable. Silver collagen dressing 11/11/17; left lateral Wound continues to look healthy be making progress. Using a #5 curet I removed removed nonviable skin from the surface of the wound and then necrotic debris from the wound surface. Surface of the wound continues to look healthy. ooHe also has an open area on the left great toenail bed. We've been using topical antibiotics. 11/19/17; left anterior lateral wound continues to look healthy but it's not closed. ooHe also had a small wound above this on the left leg ooInitially traumatic wounds in the setting of significant chronic venous insufficiency and stasis dermatitis 11/25/17; left anterior wounds superiorly is closed still a small wound inferiorly. 12/02/17; left anterior tibial area. Arrives  today with adherent callus. Post debridement clearly not completely closed. Hydrofera Blue under 3 layer compression. 12/09/17; left anterior tibia. Circumferential eschar however the wound bed looks stable to improved. We've been using Hydrofera Blue under 3 layer compression 12/17/17; left anterior tibia. Apparently this was felt to be closed however when the wrap was taken off there is a skin tear to reopen wounds in the same area we've been using Hydrofera Blue under 3 layer compression 12/23/17 left anterior tibia. Not close to close this week apparently the [facility] was stuck to this again. Still circumferential eschar requiring debridement. I put a contact layer on this this time under the Hydrofera Blue 12/31/17; left anterior tibia. Wound is better slight amount of hyper-granulation. Using Hydrofera Blue over Adaptic. 01/07/18; left anterior tibia. The wound had some surface eschar however after this was removed he has no open wound.he was already revascularized by Dr. Jacinto Halim when he came to our clinic with atherectomy of the left SFA and popliteal artery. He was also sent to interventional radiology for venous reflux studies. He was not felt to have significant reflux but certainly has chronic venous changes of his skin with hemosiderin deposition around this area. He will definitely need to lubricate his skin and wear compression stocking and I've talked to him about this. ETHERIDGE, GEIL ([id number]) [id number] Page 11 of 19 READMISSION 05/26/2018 This is a now 72 year old man we cared for with traumatic wounds on  his left anterior lower extremity. He had been previously revascularized during that admission by Dr. Jacinto Halim. Apparently in follow-up Dr. Jacinto Halim noted that he had deterioration in his arterial status. He underwent a stent placement in the distal left SFA on 04/22/2018. 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 He went back to the  angiography suite on 04/30/2018 he underwent PTA and balloon angioplasty of the occluded left mid anterior tibial artery, thrombotic occlusion went from 100 to 0% which reconstitutes the posterior tibial artery. He had thrombectomy and aspiration of the peroneal artery. The stent placed in the distal SFA left SFA was still occluded. 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 He has been recuperating at [facility] in [facility] after the most recent hospitalization. He is going home tomorrow. He tells me that 2 weeks ago he traumatized the tip of his left fifth toe. He came in urgently for our review of this. This was a history of before I noted that Dr. Jacinto Halim had already noted dry gangrenous changes of the left fifth toe 06/09/2018; 2-week follow-up. I did contact Dr. Jacinto Halim after his last appointment and he apparently saw 1 of Dr. Verl Dicker colleagues the next day. He does not follow-up with Dr. Jacinto Halim himself until Thursday of this week. 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 Nevertheless there is no evidence of infection no drainage and no pain. He had a new area that this week when we were signing him in today on the left anterior mid tibia area, this is in close proximity to the previous wound we have dealt with in this clinic. 06/23/2018; 2-week follow-up. I did not receive a recent note from Dr. Jacinto Halim to review today. Our office is trying to obtain this. He is apparently not planning to do further vascular interventions and wondered about compression to try and help with the patient's chronic venous insufficiency. However we are also concerned about the arterial flow. ooHe arrives in clinic today with a new area on the  left third toe. The areas on the calf/anterior tibia are close to closing. The left fifth toe is still mummified using Betadine. -In reviewing things with the patient he has what sounds like claudication with mild to moderate amount of activity. 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 I prescribed Levaquin over the phone while we attempted to arrange a plan of care. However the patient called yesterday to report he had low-grade fever and he came in today acutely. There is been a marked deterioration in the left third toe with spreading cellulitis up into the dorsal left foot. He was referred to the emergency room. Readmission: 06/29/2020 patient presents today for reevaluation here in our clinic he was previously treated by Dr. Leanord Hawking at the latter part of 2019 in 2 the beginning of 2020. Subsequently we have not seen him since that time in the interim he did have evaluation with vein and vascular specialist specifically Dr. Bo Mcclintock who did perform quite extensive work for a left femoral to anterior tibial artery bypass. With that being said in the interim the patient has developed significant lymphedema and has wounds that he tells me have really  never healed in regard to the incision site on the left leg. He also has multiple wounds on the feet for various reasons some of which is that he tends to pick at his feet. Fortunately there is no signs of active infection systemically at this time he does have some wounds that are little bit deeper but most are fairly superficial he seems to have good blood flow and overall everything appears to be healthy I see no bone exposed and no obvious signs of osteomyelitis. I do not know that he necessarily needs a x-ray at this point although that something we could consider depending on how things progress. The patient does have a history of lymphedema, diabetes, this is type II, chronic kidney disease stage III,  hypertension, and history of peripheral vascular disease. 07/05/2020; patient admitted last week. Is a patient I remember from 2019 he had a spreading infection involving the left foot and we sent him to the hospital. 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 He subsequently had a left femoral to anterior tibial bypass by Dr.Cain vein and vascular. He also has severe lymphedema with chronic skin changes related to that on the left leg. The most problematic area that was new today was on the left medial great toe. This was apparently a small area last week there was purulent drainage which our intake nurse cultured. Also areas on the left medial foot and heel left lateral foot. He has 2 areas on the left medial calf left lateral calf in the setting of the severe lymphedema. 07/13/2020 on evaluation today patient appears to be doing better in my opinion compared to his last visit. The good news is there is no signs of active infection systemically and locally I do not see any signs of infection either. He did have an x-ray which was negative that is great news he had a culture which showed MRSA but at the same time he is been on the doxycycline which has helped. I do think we may want to extend this for 7 additional days 1/25; patient admitted to the clinic a few weeks ago. 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 We have been putting him under compression his edema control is a lot better but he is severe verricused skin on the left leg. He is really done quite well he still has an open area on the left medial calf and the left medial first metatarsal head. We have been using silver collagen on the leg silver alginate on the foot 07/27/2020 upon evaluation today patient appears to be doing decently well in regard to his wounds. He still has a lot of dry skin on the left leg. Some of this is starting to peel back and I  think he may be able to have them out by removing some that today. Fortunately there is no signs of active infection at this time on the left leg although on the right leg he does appear to have swelling and erythema as well as some mild warmth to touch. This does have been concerned about the possibility of cellulitis although within the differential diagnosis I do think that potentially a DVT has to be at least considered. We need to rule that out before proceeding would just call in the cellulitis. Especially since he is having pain in the posterior aspect of his calf muscle. 2/8; the patient had seen sparingly. He  has severe skin changes of chronic lymphedema in the left leg thickened hyperkeratotic verrucous skin. He has an open wound on the medial part of the left first met head left mid tibia. He also has a rim of nonepithelialized skin in the anterior mid tibia. He brought in the AmLactin lotion that was been prescribed although I am not sure under compression and its utility. There concern about cellulitis on the right lower leg the last time he was here. He was put on on antibiotics. His DVT rule out was negative. The right leg looks fine he is using his stocking on this area 08/10/2020 upon evaluation today patient appears to be doing well with regard to his leg currently. He has been tolerating the dressing changes without complication. Fortunately there is no signs of active infection which is great news. Overall very pleased with where things stand. 2/22; the patient still has an area on the medial part of the left first met his head. This looks better than when I last saw this earlier this month he has a rim of epithelialization but still some surface debris. Mostly everything on the left leg is healed. There is still a vulnerable in the left mid tibia area. 08/30/2020 upon evaluation today patient appears to be doing much better in regard to his wounds on his foot. Fortunately there does  not appear to be any signs of active infection systemically though locally we did culture this last week and it does appear that he does have MRSA currently. Nonetheless I think we will address that today I Minna send in a prescription for him in that regard. Overall though there does not appear to be any signs of significant worsening. 09/07/2020 on evaluation today patient's wounds over his left foot appear to be doing excellent. I do not see any signs of infection there is some callus buildup this can require debridement for certain but overall I feel like he is managing quite nicely. He still using the AmLactin cream which has been beneficial for him as well. 3/22; left foot wound is closed. There is no open area here. He is using ammonium lactate lotion to the lower extremities to help exfoliate dry cracked skin. He has compression stockings from elastic therapy in [place]. The wound on the medial part of his left first met head is healed today. READMISSION 04/12/2021 Mr. Sweitzer is a patient we know fairly well he had a prolonged stay in clinic in 2019 with wounds on his left lateral and left anterior lower extremity in the setting of chronic venous insufficiency. More recently he was here earlier this year with predominantly an area on his left foot first metatarsal head plantar and he says the plantar foot broke down on its not long after we discharged him but he did not come back here. The last few months areas of broken down on his left anterior and again the left lateral lower extremity. The leg itself is very swollen chronically enlarged a lot of hyperkeratotic dry Berry Q skin in the left lower leg. His edema extends well into the thigh. [name], [name] ([id number]) [id number] Page 12 of 19 He was seen by Dr. Randie Heinz. He had ABIs on 03/02/2021 showing an ABI on the right of 1 with a TBI of 0.72 his ABI in the left at 1.09 TBI of 0.99. Monophasic and biphasic  waveforms on the right. On the left monophasic waveforms were noted he went on to have an angiogram on 03/27/2021 this showed  the aortic aortic and iliac segments were free of flow-limiting stenosis the left common femoral vein to evaluate the left femoral to anterior tibial artery bypass was unobstructed the bypass was patent without any areas of stenosis. We discharged the patient in bilateral juxta lite stockings but very clearly that was not sufficient to control the swelling and maintain skin integrity. He is clearly going to need compression pumps. 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 This is fortunate because he is on his feet for long periods of time. 10/27; patient comes in with our intake nurse reporting copious amount of green drainage from the left anterior mid tibia the left dorsal foot and to a lesser extent the left medial mid tibia. We left the compression wrap on all week for the amount of edema in his left leg is quite a bit better. We use silver alginate as the primary dressing 11/3; edema control is good. Left anterior lower leg left medial lower leg and the plantar first metatarsal head. The left anterior lower leg required debridement. Deep tissue culture I did of this wound showed MRSA I put him on 10 days of doxycycline which she will start today. We have him in compression wraps. He has a security card and [employer] however he is retiring on November 15. We will need to then get him into a better offloading boot for the left foot perhaps a total contact cast 11/10; edema control is quite good. Left anterior and left medial lower leg wounds in the setting of chronic venous insufficiency and lymphedema. He also has a substantial area over the left plantar first metatarsal head. 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 He has  significant hypergranulation on the left plantar foot wound. The patient is a diabetic but he does not have significant PAD 11/17; edema control is quite good. Left anterior and left medial lower leg wounds look better. The really concerning area remains the area on the left plantar first metatarsal head. He has a rim of epithelialization. 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 Starting today with a forefoot off loader but . possibly a total contact cast. 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 Arrives with a very significant odor from I think all of the wounds on the left leg and the left foot. Because of this I did not put a total contact cast on him today but will could still consider this. His wife was having cataract surgery which is the reason he missed the appointment 12/6. I saw this man 5 days ago with a swelling below the popliteal fossa. I thought he actually might have a Baker's cyst however the DVT rule out study that we could arrange right away was negative the technician told me this was not a ruptured Baker's cyst. We attempted to get this aspirated by under ultrasound guidance in interventional radiology however all they did was an ultrasound however it shows an extensive fluid collection 62 x 8 x 9.4 in the left thigh and left calf. The patient states he thinks  this started 8 days ago or so but he really is not complaining of any pain, fever or systemic symptoms. He has not ha 12/20; after some difficulty I managed to get the patient into see Dr. Randie Heinz. Eventually he was taken into the hospital and had a drain put in the fluid collection below his left knee posteriorly extending into the posterior thigh. He still has the drain in place. Culture of this showed  moderate staff aureus few Morganella and few Klebsiella he is now on doxycycline and ciprofloxacin as suggested by infectious disease he is on this for a month. The drain will remain in place until it stops draining 12/29; he comes in today with the 1 wound on his left leg and the area on the left plantar first met head significantly smaller. Both look healthy. He still has the drain in the left leg. He says he has to change this daily. Follows up with Dr. Randie Heinz on January 11. 06/29/2021; the wounds that I am following on the left leg and left first met head continued to be quite healthy. However the area where his inferior drain is in place had copious amounts of drainage which was green in color. The wound here is larger. Follows up with Dr. Pascal Lux of vein and vascular his surgeon next week as well as infectious disease. He remains on ciprofloxacin and doxycycline. He is not complaining of excessive pain in either one of the drain areas 1/12; the patient saw vascular surgery and infectious disease. Vascular surgery has left the drain in place as there was still some notable drainage still see him back in 2 weeks. Dr. Dorthula Perfect stop the doxycycline and ciprofloxacin and I do not believe he follows up with them at this point. Culture I did last week showed both doxycycline resistant MRSA and Pseudomonas not sensitive to ciprofloxacin although only in rare titers 1/19; the patient's wound on the left anterior lower leg is just about healed. We have continued healing of the area that was medially on the left leg. Left first plantar metatarsal head continues to get smaller. The major problem here is his 2 drain sites 1 on the left upper calf and lateral thigh. There is purulent drainage still from the left lateral thigh. I gave him antibiotics last week but we still have recultured. He has the drain in the area I think this is eventually going to have to come out. I suspect there will be a connecting wound to  heal here perhaps with improved VAc 1/26; the patient had his drain removed by vein and vascular on 1/25/. This was a large pocket of fluid in his left thigh that seem to tunnel into his left upper calf. 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 His mention his Penrose drain was removed today. He now has a tunneling wound on his left calf and left thigh. Both of these probe widely towards each other although I cannot really prove that they connect. Both wounds on his lower leg anteriorly are closed and his area over the first metatarsal head on his right foot continues to improve. We are using Hydrofera Blue here. He also saw infectious disease culture of the abscess they noted was polymicrobial with MRSA, Morganella and Klebsiella he was treated with doxycycline and ciprofloxacin for 4 weeks ending on 07/03/2021. They did not recommend any further antibiotics. Notable that while he still had the Penrose drain in place last week he had purulent drainage coming out  of the inferior IandD site this grew [facility], MRSA and Pseudomonas but there does not appear to be any active infection in this area today with the drain out and he is not systemically unwell 2/2; with regards to the drain sites the superior one on the thigh actually is closed down the one on the upper left lateral calf measures about 8 and half centimeters which is an improvement seems to be less prominent although still with a lot of drainage. The only remaining wound is over the first metatarsal head on the left foot and this looks to be continuing to improve with Hydrofera Blue. 2/9; the area on his plantar left foot continues to contract. Callus around the wound edge. The drain sites specifically have not come down in depth. We put the wound VAC on Monday he changed the canister late last night our intake nurse reported a pocket of fluid perhaps caused by our compression wraps 2/16; continued improvement in left  foot plantar wound. drainage site in the calf is not improved in terms of depth (wound vac) 2/23; continued improvement in the left foot wound over the first metatarsal head. With regards to the drain sites the area on his thigh laterally is healed however the open area on his calf is small in terms of circumference by still probes in by about 15 cm. Within using the wound VAC. Hydrofera Blue on his foot 08/24/2021: The left first metatarsal head wound continues to improve. The wound bed is healthy with just some surrounding callus. Unfortunately the open drain site on his calf remains open and tunnels at least 15 cm (the extent of a Q-tip). This is despite several weeks of wound VAC treatment. Based on reading back through the notes, there has been really no significant change in the depth of the wound, although the orifice is smaller and the more cranial wound on his thigh has closed. I suspect the tunnel tracks nearly all the way to this location. 08/31/2021: Continued improvement in the left first metatarsal head wound. There has been absolutely no improvement to the long tunnel from his open drain site on his calf. We have tried to get him into see vascular surgery sooner to consider the possibility of simply filleting the tract open and allowing it to heal from the bottom up, likely with a wound VAC. They have not yet scheduled a sooner appointment than his current mid April JAMIERE, GULAS ([id number]) [id number] Page 57 of 19 09/14/2021: He was seen by vascular surgery and they took him to the operating room last week. They opened a portion of the tunnel, but did not extend the entire length of the known open subcutaneous tract. I read Dr. Darcella Cheshire operative note and it is not clear from that documentation why only a portion of the tract was opened. The heaped up granulation tissue was curetted and removed from at least some portion of the tract. They did place a wound VAC  and applied an Unna boot to the leg. The ulcer on his left first metatarsal head is smaller today. The bed looks good and there is just a small amount of surrounding callus. 09/21/2021: The ulcer on his left first metatarsal head looks to be stalled. There is some callus surrounding the wound but the wound bed itself does not appear particularly dynamic. The tunnel tract on his lateral left leg seems to be roughly the same length or perhaps slightly smaller but the wound bed appears healthy with good granulation  tissue. He opened up a new wound on his medial thigh and the site of a prior surgical incision. He says that he did this unconsciously in his sleep by scratching. 09/28/2021: Unfortunately, the ulcer on his left first metatarsal head has extended underneath the callus toward the dorsum of his foot. The medial thigh wounds are roughly the same. The tunnel on his lateral left leg continues to be problematic; it is longer than we are able to actually probe with a Q-tip. I am still not certain as to why Dr. Randie Heinz did not open this up entirely when he took the patient to the operating room. We will likely be back in the same situation with just a small superficial opening in a long unhealed tract, as the open portion is granulating in nicely. 10/02/2021: The patient was initially scheduled for a nurse visit, but we are also applying a total contact cast today. The plantar foot wound looks clean without significant accumulated callus. We have been applying Prisma silver collagen to the site. 10/05/2021: The patient is here for his first total contact cast change. We have tried using gauze packing strips in the tunnel on his lateral leg wound, but this does not seem to be working any better than the white VAC foam. The foot ulcer looks about the same with minimal periwound callus. Medial thigh wound is clean with just some overlying eschar. 10/12/2021: The plantar foot wound is stable without any  significant accumulation of periwound callus. The surface is viable with good granulation tissue. The medial thigh wounds are much smaller and are epithelializing. On the other hand, he had purulent drainage coming from the tunnel on his lateral leg. He does go back to see Dr. Randie Heinz next week and is planning to ask him why the wound tunnel was not completely opened at the time of his most recent operation. 10/19/2021: The plantar foot wound is markedly improved and has epithelial tissue coming through the surface. The medial thigh wounds are nearly closed with just a tiny open area. He did see Dr. Randie Heinz earlier this week and apparently they did discuss the possibility of opening the sinus tract further and enabling a wound VAC application. Apparently there are some limits as to what Dr. 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 I think if we could get the tract open to the level of the popliteal fossa, this would greatly aid in her ability to get this chart closed. That being said, however, today when I probed the tract with a Q-tip, I was not able to insert the entirety of the Q-tip as I have on previous occasions. The tunnel is shorter by about 4 cm. The surface is clean with good granulation tissue and no further episodes of purulent drainage. 10/30/2021: Last week, the patient underwent surgery and had the long tract in his leg opened. There was a rind that was debrided, according to the operative report. His medial thigh ulcers are closed. The plantar foot wound is clean with a good surface and some built up surrounding callus. 11/06/2021: The overall dimensions of the large wound on his lateral leg remain about the same, but there is good granulation tissue present and the tunneling is a little bit shorter. He has a new wound on his anterior tibial surface, in the same location where he had a similar lesion in the past. The plantar foot wound is clean with some  buildup surrounding callus. Just toward the medial aspect of  his foot, however, there is an area of darkening that once debrided, revealed another opening in the skin surface. 11/13/2021: The anterior tibial surface wound is closed. The plantar foot wound has some surrounding callus buildup. The area of darkening that I debrided last week and revealed an opening in the skin surface has closed again. The tunnel in the large wound on his lateral leg has come in by about 3 cm. There is healthy granulation tissue on the entire wound surface. 11/23/2021: The patient was out of town last week and did wet-to-dry dressings on his large wound. 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 Unfortunately, he picked open the wound on his left medial thigh. He says that it was itching and he just could not stop scratching it until it was open again. The wound on his plantar foot is smaller and has not accumulated a tremendous amount of callus. The lateral leg wound is shallower and the tunnel has also decreased in depth. There is just a little bit of slough accumulation on the surface. 11/30/2021: Another portion of his left medial thigh has been opened up. All of these wounds are fairly superficial with just a little bit of slough and eschar accumulation. The wound on his plantar foot is almost closed with just a bit of eschar and periwound callus accumulation. The lateral leg wound is nearly flush with the surrounding skin and the tunnel is markedly shallower. 12/07/2021: There is just 1 open area on his left medial thigh. It is clean with just a little bit of perimeter eschar. The wound on his plantar foot continues to contract and just has some eschar and periwound callus accumulation. The lateral leg wound is closing at the more distal aspect and the tunnel is smaller. The surface is nearly flush with the surrounding skin and it has a good bed of  granulation tissue. 12/14/2021: The thigh and foot wounds are closed. The lateral leg wound has closed over approximately half of its length. The tunnel continues to contract and the surface is now flush with the surrounding skin. The wound bed has robust granulation tissue. 12/22/2021: The thigh and foot wounds have reopened. The foot wound has a lot of callus accumulation around and over it. The thigh wound is tiny with just a little bit of slough in the wound bed. The lateral leg wound continues to contract. His vascular surgeon took the wound VAC off earlier in the week and the patient has been doing wet-to-dry dressings. There is a little slough accumulation on the surface. The tunnel is about 3 cm in depth at this point. 12/28/2021: The thigh wound is closed again. The foot wound has some callus that subsequently has peeled back exposing just a small slit of a wound. The lateral leg wound Is down to about half the size that it originally was and the tunnel is down to about half a centimeter in depth. 01/04/2022: The thigh wound remains closed. The foot wound has heavy callus overlying the wound site. Once this was debrided, the wound was found to be closed. The lateral leg wound is smaller again this week and very superficial. No tunnel could be identified. 01/12/2022: The thigh and foot wounds both remain closed. The lateral leg wound is now nearly flush with the skin surface. There is good granulation tissue present with a light layer of slough. 01/19/2022: Due to the way his wrap was placed, the patient  did not change the dressing on his thigh at all and so the foam was saturated and his skin is macerated. There is a light layer of slough on the wound surface. The underlying granulation tissue is robust and healthy-appearing. He has heavy callus buildup at the site of his first metatarsal head wound which is still healed. 02/01/2022: He has been in silver alginate. When he removed the dressing from  his thigh wound, however, some leg, superficially reopening a portion of the wound that had healed. In addition, underneath the callus at his left first metatarsal head, there appears to be a blister and the wound appears to be open again. 02/08/2022: The lateral leg wound has contracted substantially. There is eschar and a light layer of slough present. He says that it is starting to pull and is uncomfortable. On inspection, there is some puckering of the scar and the eschar is quite dry; this may account for his symptoms. On his first metatarsal head, the wound is much smaller with just some eschar on the surface. The callus has not reaccumulated. He reports that he had a blister come up on his medial thigh wound at the distal aspect. It popped and there is now an opening in his skin again. Looking back through his library of wound photos, there is what looks like a permanent suture just deep to this location and it may be trying to erode through. We have been using silver alginate on his wounds. 02/15/2022: The lateral leg wound is about half the size it was last week. It is clean with just a little perimeter eschar and light slough. The wound on his first metatarsal head is about the same with heavy callus overlying it. The medial thigh wound is closed again. He does have some skin changes on the top of his foot that looks potentially yeast related. 02/22/2022: The skin on the top of his foot improved with the use of a topical antifungal. The lateral leg wound continues to contract and is again smaller this [name], PAXTON ([id number]) [id number] Page 14 of 19 week. There is a little bit of slough and eschar on the surface. The first metatarsal head wound is a little bit smaller but has reaccumulated a thick callus over the top. He decided to try to trim his toenail and ultimately took the entire nail off of his left great toe. 03/02/2022: His lateral leg wound continues to  improve, as does the wound on his left great toe. Unfortunately, it appears that somehow his foot got wet and moisture seeped in through the opening causing his skin to lift. There is a large wound now overlying his first metatarsal on both the plantar, medial, and dorsal portion of his foot. There is necrotic tissue and slough present underneath the shaggy macerated skin. 03/08/2022: The lateral leg wound is smaller again today. There is just a light layer of slough and eschar on the surface. The great toe wound is smaller again today. The first metatarsal wound is a little bit smaller today and does not look nearly as necrotic and macerated. There is still slough and nonviable tissue present. 03/15/2022: The lateral leg wound is narrower and just has a little bit of light slough buildup. The first metatarsal wound still has a fair amount of moisture affecting the periwound skin. The great toe wound is healed. 03/22/2022: The lateral leg wound is now isolated to just at the level of his knee. There is some eschar and slough accumulation. The  first metatarsal head wound has epithelialized tremendously and is about half the size that it was last week. He still has some maceration on the top of his foot and a fungal odor is present. 03/29/2022: T oday the patient's foot was macerated, suggesting that the cast got wet. The patient has also been picking at his dry skin and has enlarged the wound on his left lateral leg. In the time between having his cast removed and my evaluation, he had picked more dry skin and opened up additional wounds on his Achilles area and dorsal foot. The plantar first metatarsal head wound, however, is smaller and clean with just macerated callus around the perimeter and light slough on the surface. The lateral leg wound measured a little bit larger but is also fairly clean with eschar and minimal slough. 04/02/2022: The patient had vascular studies done last Friday and so his cast  was not applied. He is here today to have that done. 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 04/05/2022: Both wounds are smaller and quite clean. There is just a little biofilm on the lateral leg wound. 10/20; the patient has a wound on the left lateral surgical incision at the level of his lateral knee this looks clean and improved. He is using silver alginate. He also has an area on his left medial foot for which she is using Hydrofera Blue under a total contact cast both wounds are measuring smaller 04/20/2022: The plantar foot wound has contracted considerably and is very close to closing. The lateral leg wound was measured a little larger, but there was a tiny open area that was included in the measurements that was not included last week. He has some eschar around the perimeter but otherwise the wound looks clean. 04/27/2022: The lateral leg wound looks better this week. He says that midweek, he felt it was very dry and began applying hydrogel to the site. I think this was beneficial. The foot wound is nearly closed underneath a thick layer of dry skin and callus. 05/04/2022: The foot wound is healed. He has developed a new small ulcer on his anterior tibial surface about midway up his leg. It has a little slough on the surface. The lateral leg wound still is fairly dry, but clean with just a little biofilm on the surface. 05/11/2022: The wound on his foot reopened on Wednesday. A large blister formed which then broke open revealing the fat layer underneath. The ulcer on his anterior tibial surface is a little bit larger this week. The lateral leg wound has much better moisture balance this week. Fortunately, prior to his foot wound reopening, he did get the cast made for his orthotic. 05/15/2022: Already, the left medial foot wound has improved. The tissue is less macerated and the surface is clean. The ulcer on his anterior tibial surface continues to enlarge. This seems likely  secondary to accumulated moisture. The lateral leg wound continues to have an improved moisture balance with the use of collagen. 05/25/2022: The medial foot wound continues to contract. It is now substantially smaller with just a little slough on the surface. The anterior tibial surface wound continues to enlarge further. Once again, this seems to be secondary to moisture. The lateral leg wound does not seem to be changing much in size, but the moisture balance is better. 06/01/2022: The anterior tibial wound is closed. The medial foot wound is down to just a very small, couple of millimeters, opening. The lateral leg wound  has good moisture balance, but remains unchanged in size. 12/15; the patient's anterior tibial wound has reopened, however the area on his right first metatarsal head is closed. The major wound is actually on the superior part of his surgical wound in the left lateral thigh. Not a completely viable surface under illumination. This may at some point require a debridement I think he is currently using Prisma. As noted the left medial foot wound has closed 06/14/2022: The anterior tibial wound has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

## 2022-10-05 ENCOUNTER — Other Ambulatory Visit: Payer: Self-pay | Admitting: Nurse Practitioner

## 2022-10-08 ENCOUNTER — Telehealth: Payer: Self-pay | Admitting: *Deleted

## 2022-10-08 NOTE — Telephone Encounter (Signed)
Patient left a message that his insurance company shared with him that they are waiting on a PA for the [facility] G7 sensors. Patient only has the one that is in use and that is to expire tomorrow, 10/09/2022. Patient was advised about the RX PA team and that it may take 2 weeks or a little longer for the PA to be completed. Will sent to the team for a status update.

## 2022-10-09 ENCOUNTER — Telehealth: Payer: Self-pay

## 2022-10-09 NOTE — Telephone Encounter (Signed)
Patient Advocate Encounter   Received notification from pt msgs that prior authorization is required for Dexcom G7 sensors  Submitted: 10/09/22 Key [id number]  Sent for expedited review Status is pending

## 2022-10-10 NOTE — Telephone Encounter (Signed)
PA has been APPROVED from 10/09/2022-10/08/2023 

## 2022-10-11 ENCOUNTER — Encounter (HOSPITAL_BASED_OUTPATIENT_CLINIC_OR_DEPARTMENT_OTHER): Payer: Medicare Other | Admitting: General Surgery

## 2022-10-11 LAB — LIPID PANEL
Cholesterol: 124 (ref 0–200)
HDL: 30 — AB (ref 35–70)
LDL Cholesterol: 65
Triglycerides: 170 — AB (ref 40–160)

## 2022-10-11 LAB — BASIC METABOLIC PANEL
BUN: 25 — AB (ref 4–21)
Creatinine: 1.6 — AB (ref 0.6–1.3)
Glucose: 65

## 2022-10-11 LAB — COMPREHENSIVE METABOLIC PANEL
Albumin: 4.3 (ref 3.5–5.0)
Calcium: 9.9 (ref 8.7–10.7)

## 2022-10-11 LAB — HEPATIC FUNCTION PANEL
ALT: 20 U/L (ref 10–40)
AST: 21 (ref 14–40)
Alkaline Phosphatase: 132 — AB (ref 25–125)
Bilirubin, Total: 0.3

## 2022-10-11 LAB — PSA: PSA: 0.6

## 2022-10-11 LAB — PROTEIN / CREATININE RATIO, URINE
Albumin, U: 29.3
Creatinine, Urine: 102.5

## 2022-10-11 LAB — MICROALBUMIN / CREATININE URINE RATIO: Microalb Creat Ratio: 29

## 2022-10-11 NOTE — Progress Notes (Signed)
ONA, [name] ([id number]) [id number] Page 1 of 19 Visit Report for 09/07/2022 Chief Complaint Document Details Patient Name: Date of Service: [name], [name] 09/07/2022 10:00 A M Medical Record Number: [id number] Patient Account Number: [id number] Date of Birth/Sex: Treating RN: [birth year] (72 y.o. M) Primary Care Provider: [name] Other Clinician: Referring Provider: Treating Provider/Extender: Lyndal Pulley in Treatment: 85 Information Obtained from: Patient Chief Complaint Left leg and foot ulcers 04/12/2021; patient is here for wounds on his left lower leg and left plantar foot over the first metatarsal head Electronic Signature(s) Signed: 09/07/2022 12:33:12 PM By: Geralyn Corwin DO Entered By: Geralyn Corwin on 09/07/2022 12:25:32 -------------------------------------------------------------------------------- Debridement Details Patient Name: Date of Service: [name]. 09/07/2022 10:00 A M Medical Record Number: [id number] Patient Account Number: [id number] Date of Birth/Sex: Treating RN: Apr 08, 1951 (72 y.o. [name] Primary Care Provider: [name] Other Clinician: Referring Provider: Treating Provider/Extender: Lyndal Pulley in Treatment: 73 Debridement Performed for Assessment: Wound #18R Left,Plantar Metatarsal head first Performed By: Physician Geralyn Corwin, DO Debridement Type: Debridement Severity of Tissue Pre Debridement: Fat layer exposed Level of Consciousness (Pre-procedure): Awake and Alert Pre-procedure Verification/Time Out Yes - 10:35 Taken: Start Time: 10:35 Pain Control: Lidocaine 4% T opical Solution T Area Debrided (L x W): otal 1.6 (cm) x 2.8 (cm) = 4.48 (cm) Tissue and other material debrided: Non-Viable, Callus, Slough, Subcutaneous, Slough Level: Skin/Subcutaneous Tissue Debridement Description: Excisional Instrument: Curette Bleeding:  Minimum Hemostasis Achieved: Pressure Response to Treatment: Procedure was tolerated well Level of Consciousness (Post- Awake and Alert procedure): Post Debridement Measurements of Total Wound Length: (cm) 1.6 Width: (cm) 2.8 Depth: (cm) 0.1 Volume: (cm) 0.352 Character of Wound/Ulcer Post Debridement: Improved Severity of Tissue Post Debridement: Fat layer exposed Post Procedure Diagnosis Same as Pre-procedure Notes Scribed for Dr. Mikey Bussing by Brenton Grills RN. Electronic Signature(s) Signed: 09/07/2022 12:33:12 PM By: Geralyn Corwin DO Signed: 10/11/2022 1:55:51 PM By: Lawernce Ion ([id number]) [id number] Page 2 of 19 Entered By: Brenton Grills on 09/07/2022 10:41:32 -------------------------------------------------------------------------------- HPI Details Patient Name: Date of Service: [name], [name] 09/07/2022 10:00 A M Medical Record Number: [id number] Patient Account Number: [id number] Date of Birth/Sex: Treating RN: September 24, 1950 (72 y.o. M) Primary Care Provider: [name] Other Clinician: Referring Provider: Treating Provider/Extender: Lyndal Pulley in Treatment: 74 History of Present Illness HPI Description: 10/11/17; [name] is a 72 year old man who tells me that in 2015 he slipped down the latter traumatizing his left leg. He developed a wound in the same spot the area that we are currently looking at. He states this closed over for the most part although he always felt it was somewhat unstable. In 2016 he hit the same area with the door of his car had this reopened. He tells me that this is never really closed although sometimes an inflow it remains open on a constant basis. He has not been using any specific dressing to this except for topical antibiotics the nature of which were not really sure. His primary doctor did send him to see Dr. Jacinto Halim of interventional cardiology. He underwent an  angiogram on 08/06/17 and he underwent a PTA and directional atherectomy of the lesser distal SFA and popliteal arteries which resulted in brisk improvement in blood flow. It was noted that he had 2 vessel runoff through the anterior tibial and peroneal. He is also been to see vascular and interventional radiologist. He was not felt to have any  significant superficial venous insufficiency. Presumably is not a candidate for any ablation. It was suggested he come here for wound care. The patient is a type II diabetic on insulin. He also has a history of venous insufficiency. ABIs on the left were noncompressible in our clinic 10/21/17; patient we admitted to the clinic last week. 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 We put Iodosorb on him after an aggressive debridement and 3 layer compression. He complained of pain in his ankle and itching with is skin in fact he scratched the area on the medial calf superiorly at the rim of our wraps and he has 2 small open areas in that location today which are new. I changed his primary dressing today to silver collagen. As noted he is already had revascularization and does not have any significant superficial venous insufficiency that would be amenable to ablation 10/28/17; patient admitted to the clinic 2 weeks ago. He has a smaller Wound. Scratch injury from last week revealed. There is large wound over the tibial area. This is smaller. Granulation looks healthy. No need for debridement. 11/04/17; the wound on the left lateral calf looks better. Improved dimensions. Surface of this looks better. We've been maintaining him and Kerlix Coban wraps. He finds this much more comfortable. Silver collagen dressing 11/11/17; left lateral Wound continues to look healthy be making progress. Using a #5 curet I removed removed nonviable skin from the surface of the wound and then necrotic debris from the wound  surface. Surface of the wound continues to look healthy. He also has an open area on the left great toenail bed. We've been using topical antibiotics. 11/19/17; left anterior lateral wound continues to look healthy but it's not closed. He also had a small wound above this on the left leg Initially traumatic wounds in the setting of significant chronic venous insufficiency and stasis dermatitis 11/25/17; left anterior wounds superiorly is closed still a small wound inferiorly. 12/02/17; left anterior tibial area. Arrives today with adherent callus. Post debridement clearly not completely closed. Hydrofera Blue under 3 layer compression. 12/09/17; left anterior tibia. Circumferential eschar however the wound bed looks stable to improved. We've been using Hydrofera Blue under 3 layer compression 12/17/17; left anterior tibia. Apparently this was felt to be closed however when the wrap was taken off there is a skin tear to reopen wounds in the same area we've been using Hydrofera Blue under 3 layer compression 12/23/17 left anterior tibia. Not close to close this week apparently the [facility] was stuck to this again. Still circumferential eschar requiring debridement. I put a contact layer on this this time under the Hydrofera Blue 12/31/17; left anterior tibia. Wound is better slight amount of hyper-granulation. Using Hydrofera Blue over Adaptic. 01/07/18; left anterior tibia. The wound had some surface eschar however after this was removed he has no open wound.he was already revascularized by Dr. Jacinto Halim when he came to our clinic with atherectomy of the left SFA and popliteal artery. He was also sent to interventional radiology for venous reflux studies. He was not felt to have significant reflux but certainly has chronic venous changes of his skin with hemosiderin deposition around this area. He will definitely need to lubricate his skin and wear compression stocking and I've talked to him about  this. READMISSION 05/26/2018 This is a now 72 year old man we cared for with traumatic wounds on his left anterior lower extremity. He had been  previously revascularized during that admission by Dr. Jacinto Halim. Apparently in follow-up Dr. Jacinto Halim noted that he had deterioration in his arterial status. He underwent a stent placement in the distal left SFA on 04/22/2018. 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 He went back to the angiography suite on 04/30/2018 he underwent PTA and balloon angioplasty of the occluded left mid anterior tibial artery, thrombotic occlusion went from 100 to 0% which reconstitutes the posterior tibial artery. He had thrombectomy and aspiration of the peroneal artery. The stent placed in the distal SFA left SFA was still occluded. 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 He has been recuperating at [facility] home in [facility] after the most recent hospitalization. He is going home tomorrow. He tells me that 2 weeks ago he traumatized the tip of his left fifth toe. He came in urgently for our review of this. This was a history of before I noted that Dr. Jacinto Halim had already noted dry gangrenous changes of the left fifth toe 06/09/2018; 2-week follow-up. I did contact Dr. Jacinto Halim after his last appointment and he apparently saw 1 of Dr. Verl Dicker colleagues the next day. He does not follow-up with Dr. Jacinto Halim himself until Thursday of this week. 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 Nevertheless there is no evidence of infection no drainage and no pain. He had a new area that this week when we were signing him in today on the left anterior mid tibia area, this is in close proximity to the previous  wound we have dealt with in this clinic. 06/23/2018; 2-week follow-up. I did not receive a recent note from Dr. Jacinto Halim to review today. Our office is trying to obtain this. He is apparently not planning to do further vascular interventions and wondered about compression to try and help with the patient's chronic venous insufficiency. However we are also concerned about the arterial flow. He arrives in clinic today with a new area on the left third toe. The areas on the calf/anterior tibia are close to closing. The left fifth toe is still mummified using Betadine. -In reviewing things with the patient he has what sounds like claudication with mild to moderate amount of activity. 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 I prescribed Levaquin over the phone while we attempted to arrange a plan of care. However the patient called yesterday to report he had low-grade fever and he came in today acutely. There is been a marked deterioration in the left third toe with spreading cellulitis up into the dorsal left foot. He was referred to the emergency room. [name], [name] ([id number]) [id number] Page 3 of 19 Readmission: 06/29/2020 patient presents today for reevaluation here in our clinic he was previously treated by Dr. Leanord Hawking at the latter part of 2019 in 2 the beginning of 2020. Subsequently we have not seen him since that time in the interim he did have evaluation with vein and vascular specialist specifically Dr. Bo Mcclintock who did perform quite extensive work for a left femoral to anterior tibial artery bypass. With that being said in the interim the patient has developed significant lymphedema and has wounds that he tells me have really  never healed in regard to the incision site on the left leg. He also has multiple wounds on the feet for various reasons some of which is that he tends to pick at his feet. Fortunately there is no signs of  active infection systemically at this time he does have some wounds that are little bit deeper but most are fairly superficial he seems to have good blood flow and overall everything appears to be healthy I see no bone exposed and no obvious signs of osteomyelitis. I do not know that he necessarily needs a x-ray at this point although that something we could consider depending on how things progress. The patient does have a history of lymphedema, diabetes, this is type II, chronic kidney disease stage III, hypertension, and history of peripheral vascular disease. 07/05/2020; patient admitted last week. Is a patient I remember from 2019 he had a spreading infection involving the left foot and we sent him to the hospital. 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 He subsequently had a left femoral to anterior tibial bypass by Dr.Cain vein and vascular. He also has severe lymphedema with chronic skin changes related to that on the left leg. The most problematic area that was new today was on the left medial great toe. This was apparently a small area last week there was purulent drainage which our intake nurse cultured. Also areas on the left medial foot and heel left lateral foot. He has 2 areas on the left medial calf left lateral calf in the setting of the severe lymphedema. 07/13/2020 on evaluation today patient appears to be doing better in my opinion compared to his last visit. The good news is there is no signs of active infection systemically and locally I do not see any signs of infection either. He did have an x-ray which was negative that is great news he had a culture which showed MRSA but at the same time he is been on the doxycycline which has helped. I do think we may want to extend this for 7 additional days 1/25; patient admitted to the clinic a few weeks ago. 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 We  have been putting him under compression his edema control is a lot better but he is severe verricused skin on the left leg. He is really done quite well he still has an open area on the left medial calf and the left medial first metatarsal head. We have been using silver collagen on the leg silver alginate on the foot 07/27/2020 upon evaluation today patient appears to be doing decently well in regard to his wounds. He still has a lot of dry skin on the left leg. Some of this is starting to peel back and I think he may be able to have them out by removing some that today. Fortunately there is no signs of active infection at this time on the left leg although on the right leg he does appear to have swelling and erythema as well as some mild warmth to touch. This does have been concerned about the possibility of cellulitis although within the differential diagnosis I do think that potentially a DVT has to be at least considered. We need to rule that out before proceeding would just call in the cellulitis. Especially since he is having pain in the posterior aspect of his calf muscle. 2/8; the patient had seen sparingly. He  has severe skin changes of chronic lymphedema in the left leg thickened hyperkeratotic verrucous skin. He has an open wound on the medial part of the left first met head left mid tibia. He also has a rim of nonepithelialized skin in the anterior mid tibia. He brought in the AmLactin lotion that was been prescribed although I am not sure under compression and its utility. There concern about cellulitis on the right lower leg the last time he was here. He was put on on antibiotics. His DVT rule out was negative. The right leg looks fine he is using his stocking on this area 08/10/2020 upon evaluation today patient appears to be doing well with regard to his leg currently. He has been tolerating the dressing changes without complication. Fortunately there is no signs of active infection which  is great news. Overall very pleased with where things stand. 2/22; the patient still has an area on the medial part of the left first met his head. This looks better than when I last saw this earlier this month he has a rim of epithelialization but still some surface debris. Mostly everything on the left leg is healed. There is still a vulnerable in the left mid tibia area. 08/30/2020 upon evaluation today patient appears to be doing much better in regard to his wounds on his foot. 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 Nonetheless I think we will address that today I Minna send in a prescription for him in that regard. Overall though there does not appear to be any signs of significant worsening. 09/07/2020 on evaluation today patient's wounds over his left foot appear to be doing excellent. I do not see any signs of infection there is some callus buildup this can require debridement for certain but overall I feel like he is managing quite nicely. He still using the AmLactin cream which has been beneficial for him as well. 3/22; left foot wound is closed. There is no open area here. He is using ammonium lactate lotion to the lower extremities to help exfoliate dry cracked skin. He has compression stockings from elastic therapy in [place]. The wound on the medial part of his left first met head is healed today. READMISSION 04/12/2021 Mr. Hassan is a patient we know fairly well he had a prolonged stay in clinic in 2019 with wounds on his left lateral and left anterior lower extremity in the setting of chronic venous insufficiency. More recently he was here earlier this year with predominantly an area on his left foot first metatarsal head plantar and he says the plantar foot broke down on its not long after we discharged him but he did not come back here. The last  few months areas of broken down on his left anterior and again the left lateral lower extremity. The leg itself is very swollen chronically enlarged a lot of hyperkeratotic dry Berry Q skin in the left lower leg. His edema extends well into the thigh. He was seen by Dr. Randie Heinz. He had ABIs on 03/02/2021 showing an ABI on the right of 1 with a TBI of 0.72 his ABI in the left at 1.09 TBI of 0.99. Monophasic and biphasic waveforms on the right. On the left monophasic waveforms were noted he went on to have an angiogram on 03/27/2021 this showed the aortic aortic and iliac segments were free of  flow-limiting stenosis the left common femoral vein to evaluate the left femoral to anterior tibial artery bypass was unobstructed the bypass was patent without any areas of stenosis. We discharged the patient in bilateral juxta lite stockings but very clearly that was not sufficient to control the swelling and maintain skin integrity. He is clearly going to need compression pumps. 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 This is fortunate because he is on his feet for long periods of time. 10/27; patient comes in with our intake nurse reporting copious amount of green drainage from the left anterior mid tibia the left dorsal foot and to a lesser extent the left medial mid tibia. We left the compression wrap on all week for the amount of edema in his left leg is quite a bit better. We use silver alginate as the primary dressing 11/3; edema control is good. Left anterior lower leg left medial lower leg and the plantar first metatarsal head. The left anterior lower leg required debridement. Deep tissue culture I did of this wound showed MRSA I put him on 10 days of doxycycline which she will start today. We have him in compression wraps. He has a security card and [employer] however he is retiring on November 15. We will need to then get him into a better offloading boot  for the left foot perhaps a total contact cast 11/10; edema control is quite good. Left anterior and left medial lower leg wounds in the setting of chronic venous insufficiency and lymphedema. He also has a substantial area over the left plantar first metatarsal head. 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 He has significant hypergranulation on the left plantar foot wound. The patient is a diabetic but he does not have significant PAD 11/17; edema control is quite good. Left anterior and left medial lower leg wounds look better. The really concerning area remains the area on the left plantar first metatarsal head. He has a rim of epithelialization. 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 Starting today with a forefoot off loader but . possibly a total contact cast. 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 Arrives with a very significant odor from I think all of the wounds on the left leg and the left foot. Because of this I did not put a total contact cast on him today but will could still consider this. His wife was having cataract [name], [name] ([id number]) [id number] Page 4 of 19 surgery which is the reason he missed the appointment 12/6. I saw this man 5 days ago with a swelling below the popliteal fossa. I thought he actually might have a Baker's cyst however the DVT rule out study that we could arrange right away was negative the technician told me this was not a ruptured Baker's cyst. We attempted to get this aspirated by under ultrasound guidance in interventional radiology however all they did was an ultrasound however it shows an extensive  fluid collection 62 x 8 x 9.4 in the left thigh and left calf. The patient states he  thinks this started 8 days ago or so but he really is not complaining of any pain, fever or systemic symptoms. He has not ha 12/20; after some difficulty I managed to get the patient into see Dr. Randie Heinz. Eventually he was taken into the hospital and had a drain put in the fluid collection below his left knee posteriorly extending into the posterior thigh. He still has the drain in place. Culture of this showed moderate staff aureus few Morganella and few Klebsiella he is now on doxycycline and ciprofloxacin as suggested by infectious disease he is on this for a month. The drain will remain in place until it stops draining 12/29; he comes in today with the 1 wound on his left leg and the area on the left plantar first met head significantly smaller. Both look healthy. He still has the drain in the left leg. He says he has to change this daily. Follows up with Dr. Randie Heinz on January 11. 06/29/2021; the wounds that I am following on the left leg and left first met head continued to be quite healthy. However the area where his inferior drain is in place had copious amounts of drainage which was green in color. The wound here is larger. Follows up with Dr. Pascal Lux of vein and vascular his surgeon next week as well as infectious disease. He remains on ciprofloxacin and doxycycline. He is not complaining of excessive pain in either one of the drain areas 1/12; the patient saw vascular surgery and infectious disease. Vascular surgery has left the drain in place as there was still some notable drainage still see him back in 2 weeks. Dr. Dorthula Perfect stop the doxycycline and ciprofloxacin and I do not believe he follows up with them at this point. Culture I did last week showed both doxycycline resistant MRSA and Pseudomonas not sensitive to ciprofloxacin although only in rare titers 1/19; the patient's wound on the left anterior lower leg is  just about healed. We have continued healing of the area that was medially on the left leg. Left first plantar metatarsal head continues to get smaller. The major problem here is his 2 drain sites 1 on the left upper calf and lateral thigh. There is purulent drainage still from the left lateral thigh. I gave him antibiotics last week but we still have recultured. He has the drain in the area I think this is eventually going to have to come out. I suspect there will be a connecting wound to heal here perhaps with improved VAc 1/26; the patient had his drain removed by vein and vascular on 1/25/. This was a large pocket of fluid in his left thigh that seem to tunnel into his left upper calf. 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 His mention his Penrose drain was removed today. He now has a tunneling wound on his left calf and left thigh. Both of these probe widely towards each other although I cannot really prove that they connect. Both wounds on his lower leg anteriorly are closed and his area over the first metatarsal head on his right foot continues to improve. We are using Hydrofera Blue here. He also saw infectious disease culture of the abscess they noted was polymicrobial with MRSA, Morganella and Klebsiella he was treated with doxycycline and ciprofloxacin for 4 weeks ending on 07/03/2021. They did not recommend any further antibiotics. Notable that while he still had the Penrose drain in place last week he had purulent drainage coming out  of the inferior IandD site this grew [facility], MRSA and Pseudomonas but there does not appear to be any active infection in this area today with the drain out and he is not systemically unwell 2/2; with regards to the drain sites the superior one on the thigh actually is closed down the one on the upper left lateral calf measures about 8 and half centimeters which is an improvement seems to be less prominent although still with a lot  of drainage. The only remaining wound is over the first metatarsal head on the left foot and this looks to be continuing to improve with Hydrofera Blue. 2/9; the area on his plantar left foot continues to contract. Callus around the wound edge. The drain sites specifically have not come down in depth. We put the wound VAC on Monday he changed the canister late last night our intake nurse reported a pocket of fluid perhaps caused by our compression wraps 2/16; continued improvement in left foot plantar wound. drainage site in the calf is not improved in terms of depth (wound vac) 2/23; continued improvement in the left foot wound over the first metatarsal head. With regards to the drain sites the area on his thigh laterally is healed however the open area on his calf is small in terms of circumference by still probes in by about 15 cm. Within using the wound VAC. Hydrofera Blue on his foot 08/24/2021: The left first metatarsal head wound continues to improve. The wound bed is healthy with just some surrounding callus. Unfortunately the open drain site on his calf remains open and tunnels at least 15 cm (the extent of a Q-tip). This is despite several weeks of wound VAC treatment. Based on reading back through the notes, there has been really no significant change in the depth of the wound, although the orifice is smaller and the more cranial wound on his thigh has closed. I suspect the tunnel tracks nearly all the way to this location. 08/31/2021: Continued improvement in the left first metatarsal head wound. There has been absolutely no improvement to the long tunnel from his open drain site on his calf. We have tried to get him into see vascular surgery sooner to consider the possibility of simply filleting the tract open and allowing it to heal from the bottom up, likely with a wound VAC. They have not yet scheduled a sooner appointment than his current mid April 09/14/2021: He was seen by vascular  surgery and they took him to the operating room last week. They opened a portion of the tunnel, but did not extend the entire length of the known open subcutaneous tract. I read Dr. Darcella Cheshire operative note and it is not clear from that documentation why only a portion of the tract was opened. The heaped up granulation tissue was curetted and removed from at least some portion of the tract. They did place a wound VAC and applied an Unna boot to the leg. The ulcer on his left first metatarsal head is smaller today. The bed looks good and there is just a small amount of surrounding callus. 09/21/2021: The ulcer on his left first metatarsal head looks to be stalled. There is some callus surrounding the wound but the wound bed itself does not appear particularly dynamic. The tunnel tract on his lateral left leg seems to be roughly the same length or perhaps slightly smaller but the wound bed appears healthy with good granulation tissue. He opened up a new wound on his  medial thigh and the site of a prior surgical incision. He says that he did this unconsciously in his sleep by scratching. 09/28/2021: Unfortunately, the ulcer on his left first metatarsal head has extended underneath the callus toward the dorsum of his foot. The medial thigh wounds are roughly the same. The tunnel on his lateral left leg continues to be problematic; it is longer than we are able to actually probe with a Q-tip. I am still not certain as to why Dr. Randie Heinz did not open this up entirely when he took the patient to the operating room. We will likely be back in the same situation with just a small superficial opening in a long unhealed tract, as the open portion is granulating in nicely. 10/02/2021: The patient was initially scheduled for a nurse visit, but we are also applying a total contact cast today. The plantar foot wound looks clean without significant accumulated callus. We have been applying Prisma silver collagen to the  site. 10/05/2021: The patient is here for his first total contact cast change. We have tried using gauze packing strips in the tunnel on his lateral leg wound, but this does not seem to be working any better than the white VAC foam. The foot ulcer looks about the same with minimal periwound callus. Medial thigh wound is clean with just some overlying eschar. 10/12/2021: The plantar foot wound is stable without any significant accumulation of periwound callus. The surface is viable with good granulation tissue. The medial thigh wounds are much smaller and are epithelializing. On the other hand, he had purulent drainage coming from the tunnel on his lateral leg. He does go back to see Dr. Randie Heinz next week and is planning to ask him why the wound tunnel was not completely opened at the time of his most recent operation. 10/19/2021: The plantar foot wound is markedly improved and has epithelial tissue coming through the surface. The medial thigh wounds are nearly closed with just a tiny open area. He did see Dr. Randie Heinz earlier this week and apparently they did discuss the possibility of opening the sinus tract further and enabling a wound VAC application. Apparently there are some limits as to what Dr. 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 I think [name], [name] ([id number]) [id number] Page 5 of 19 if we could get the tract open to the level of the popliteal fossa, this would greatly aid in her ability to get this chart closed. That being said, however, today when I probed the tract with a Q-tip, I was not able to insert the entirety of the Q-tip as I have on previous occasions. The tunnel is shorter by about 4 cm. The surface is clean with good granulation tissue and no further episodes of purulent drainage. 10/30/2021: Last week, the patient underwent surgery and had the long tract in his leg opened. There was a rind that was debrided,  according to the operative report. His medial thigh ulcers are closed. The plantar foot wound is clean with a good surface and some built up surrounding callus. 11/06/2021: The overall dimensions of the large wound on his lateral leg remain about the same, but there is good granulation tissue present and the tunneling is a little bit shorter. He has a new wound on his anterior tibial surface, in the same location where he had a similar lesion in the past. The plantar foot wound is clean with some buildup surrounding callus. Just toward the medial aspect of  his foot, however, there is an area of darkening that once debrided, revealed another opening in the skin surface. 11/13/2021: The anterior tibial surface wound is closed. The plantar foot wound has some surrounding callus buildup. The area of darkening that I debrided last week and revealed an opening in the skin surface has closed again. The tunnel in the large wound on his lateral leg has come in by about 3 cm. There is healthy granulation tissue on the entire wound surface. 11/23/2021: The patient was out of town last week and did wet-to-dry dressings on his large wound. 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 Unfortunately, he picked open the wound on his left medial thigh. He says that it was itching and he just could not stop scratching it until it was open again. The wound on his plantar foot is smaller and has not accumulated a tremendous amount of callus. The lateral leg wound is shallower and the tunnel has also decreased in depth. There is just a little bit of slough accumulation on the surface. 11/30/2021: Another portion of his left medial thigh has been opened up. All of these wounds are fairly superficial with just a little bit of slough and eschar accumulation. The wound on his plantar foot is almost closed with just a bit of eschar and periwound callus accumulation. The  lateral leg wound is nearly flush with the surrounding skin and the tunnel is markedly shallower. 12/07/2021: There is just 1 open area on his left medial thigh. It is clean with just a little bit of perimeter eschar. The wound on his plantar foot continues to contract and just has some eschar and periwound callus accumulation. The lateral leg wound is closing at the more distal aspect and the tunnel is smaller. The surface is nearly flush with the surrounding skin and it has a good bed of granulation tissue. 12/14/2021: The thigh and foot wounds are closed. The lateral leg wound has closed over approximately half of its length. The tunnel continues to contract and the surface is now flush with the surrounding skin. The wound bed has robust granulation tissue. 12/22/2021: The thigh and foot wounds have reopened. The foot wound has a lot of callus accumulation around and over it. The thigh wound is tiny with just a little bit of slough in the wound bed. The lateral leg wound continues to contract. His vascular surgeon took the wound VAC off earlier in the week and the patient has been doing wet-to-dry dressings. There is a little slough accumulation on the surface. The tunnel is about 3 cm in depth at this point. 12/28/2021: The thigh wound is closed again. The foot wound has some callus that subsequently has peeled back exposing just a small slit of a wound. The lateral leg wound Is down to about half the size that it originally was and the tunnel is down to about half a centimeter in depth. 01/04/2022: The thigh wound remains closed. The foot wound has heavy callus overlying the wound site. Once this was debrided, the wound was found to be closed. The lateral leg wound is smaller again this week and very superficial. No tunnel could be identified. 01/12/2022: The thigh and foot wounds both remain closed. The lateral leg wound is now nearly flush with the skin surface. There is good granulation  tissue present with a light layer of slough. 01/19/2022: Due to the way his wrap was placed, the  patient did not change the dressing on his thigh at all and so the foam was saturated and his skin is macerated. There is a light layer of slough on the wound surface. The underlying granulation tissue is robust and healthy-appearing. He has heavy callus buildup at the site of his first metatarsal head wound which is still healed. 02/01/2022: He has been in silver alginate. When he removed the dressing from his thigh wound, however, some leg, superficially reopening a portion of the wound that had healed. In addition, underneath the callus at his left first metatarsal head, there appears to be a blister and the wound appears to be open again. 02/08/2022: The lateral leg wound has contracted substantially. There is eschar and a light layer of slough present. He says that it is starting to pull and is uncomfortable. On inspection, there is some puckering of the scar and the eschar is quite dry; this may account for his symptoms. On his first metatarsal head, the wound is much smaller with just some eschar on the surface. The callus has not reaccumulated. He reports that he had a blister come up on his medial thigh wound at the distal aspect. It popped and there is now an opening in his skin again. Looking back through his library of wound photos, there is what looks like a permanent suture just deep to this location and it may be trying to erode through. We have been using silver alginate on his wounds. 02/15/2022: The lateral leg wound is about half the size it was last week. It is clean with just a little perimeter eschar and light slough. The wound on his first metatarsal head is about the same with heavy callus overlying it. The medial thigh wound is closed again. He does have some skin changes on the top of his foot that looks potentially yeast related. 02/22/2022: The skin on the top of his foot improved  with the use of a topical antifungal. The lateral leg wound continues to contract and is again smaller this week. There is a little bit of slough and eschar on the surface. The first metatarsal head wound is a little bit smaller but has reaccumulated a thick callus over the top. He decided to try to trim his toenail and ultimately took the entire nail off of his left great toe. 03/02/2022: His lateral leg wound continues to improve, as does the wound on his left great toe. Unfortunately, it appears that somehow his foot got wet and moisture seeped in through the opening causing his skin to lift. There is a large wound now overlying his first metatarsal on both the plantar, medial, and dorsal portion of his foot. There is necrotic tissue and slough present underneath the shaggy macerated skin. 03/08/2022: The lateral leg wound is smaller again today. There is just a light layer of slough and eschar on the surface. The great toe wound is smaller again today. The first metatarsal wound is a little bit smaller today and does not look nearly as necrotic and macerated. There is still slough and nonviable tissue present. 03/15/2022: The lateral leg wound is narrower and just has a little bit of light slough buildup. The first metatarsal wound still has a fair amount of moisture affecting the periwound skin. The great toe wound is healed. 03/22/2022: The lateral leg wound is now isolated to just at the level of his knee. There is some eschar and slough accumulation. The first metatarsal head wound has epithelialized tremendously and is  about half the size that it was last week. He still has some maceration on the top of his foot and a fungal odor is present. 03/29/2022: T oday the patient's foot was macerated, suggesting that the cast got wet. The patient has also been picking at his dry skin and has enlarged the wound on his left lateral leg. In the time between having his cast removed and my evaluation, he had  picked more dry skin and opened up additional wounds on his Achilles area and dorsal foot. The plantar first metatarsal head wound, however, is smaller and clean with just macerated callus around the perimeter and light slough on the surface. The lateral leg wound measured a little bit larger but is also fairly clean with eschar and minimal slough. 04/02/2022: The patient had vascular studies done last Friday and so his cast was not applied. He is here today to have that done. 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 04/05/2022: Both wounds are smaller and quite clean. There is just a little biofilm on the lateral leg wound. 10/20; the patient has a wound on the left lateral surgical incision at the level of his lateral knee this looks clean and improved. He is using silver alginate. He [name], [name] ([id number]) [id number] Page 6 of 19 also has an area on his left medial foot for which she is using Hydrofera Blue under a total contact cast both wounds are measuring smaller 04/20/2022: The plantar foot wound has contracted considerably and is very close to closing. The lateral leg wound was measured a little larger, but there was a tiny open area that was included in the measurements that was not included last week. He has some eschar around the perimeter but otherwise the wound looks clean. 04/27/2022: The lateral leg wound looks better this week. He says that midweek, he felt it was very dry and began applying hydrogel to the site. I think this was beneficial. The foot wound is nearly closed underneath a thick layer of dry skin and callus. 05/04/2022: The foot wound is healed. He has developed a new small ulcer on his anterior tibial surface about midway up his leg. It has a little slough on the surface. The lateral leg wound still is fairly dry, but clean with just a little biofilm on the surface. 05/11/2022: The wound on his foot reopened on Wednesday. A  large blister formed which then broke open revealing the fat layer underneath. The ulcer on his anterior tibial surface is a little bit larger this week. The lateral leg wound has much better moisture balance this week. Fortunately, prior to his foot wound reopening, he did get the cast made for his orthotic. 05/15/2022: Already, the left medial foot wound has improved. The tissue is less macerated and the surface is clean. The ulcer on his anterior tibial surface continues to enlarge. This seems likely secondary to accumulated moisture. The lateral leg wound continues to have an improved moisture balance with the use of collagen. 05/25/2022: The medial foot wound continues to contract. It is now substantially smaller with just a little slough on the surface. The anterior tibial surface wound continues to enlarge further. Once again, this seems to be secondary to moisture. The lateral leg wound does not seem to be changing much in size, but the moisture balance is better. 06/01/2022: The anterior tibial wound is closed. The medial foot wound is down to just a very small, couple of millimeters, opening. The lateral leg wound  has good moisture balance, but remains unchanged in size. 12/15; the patient's anterior tibial wound has reopened, however the area on his right first metatarsal head is closed. The major wound is actually on the superior part of his surgical wound in the left lateral thigh. Not a completely viable surface under illumination. This may at some point require a debridement I think he is currently using Prisma. As noted the left medial foot wound has closed 06/14/2022: The anterior tibial wound has closed. The lateral leg wound has a better surface but is basically unchanged in size. The left medial foot wound has reopened. It looks as though there was some callus accumulation and moisture got under the callus which caused the tissue to break down again. 06/21/2022: A new wound has  opened up just distal to the previous anterior tibial wound. It is small but has hypertrophic granulation tissue present. The lateral leg wound is a little bit narrower and has a layer of slough on the surface. The left medial foot wound is down to just a pinhole. His custom orthotics should be available next week. 06/28/2022: The wound on his first metatarsal head has healed. He has developed a new small wound on his medial lower leg, in an old scar site. The lateral leg wound continues to contract but continues to accumulate slough, as well. 07/03/2022: Despite wearing his custom orthopedic shoes, he managed to reopen the wound on his first metatarsal head. He says he thinks his foot got wet and then some skin lifted up and he peeled this away. Both of the lower leg wounds are smaller and have some dry eschar on the surface. The lateral leg wound is quite a bit narrower today. 07/12/2022: The medial lower leg wound is closed. The anterior lower leg wound has contracted considerably. The lateral upper leg wound is narrower with a layer of slough on the surface. The first metatarsal head wound is also smaller, but had copious drainage which saturated the foam border dressing and resulted in some periwound tissue maceration. Fortunately there was no breakdown at this site. 07/19/2022: The lower leg shows signs of significant maceration; I think he must be sweating excessively inside his cast. There are several areas of skin breakdown present. The wound on his foot is smaller and that on his lateral leg is narrower and is shorter by about a centimeter. 07/26/2022: Last week we used a zinc Coflex wrap prior to applying his total contact cast and this has had the effect of keeping his skin from getting macerated this week. The anterior leg wound has epithelialized substantially. The lateral leg wound is significantly smaller with just a bit of slough on the surface. The first metatarsal head wound is also smaller  this week. 08/02/2022: The anterior leg wound was closed on arrival, but while he was sitting in the room, he picked it open again. The lateral leg wound is smaller with just a little slough on the surface and the first metatarsal head wound has contracted further, as well. 08/09/2022: The first metatarsal head wound is covered with callus. Underneath the callus, it is nearly completely closed. The lateral leg wound is smaller again this week. The anterior leg wound looks better, but he has such heavy buildup of old skin, that moisture is getting underneath this, becoming trapped, and causing the underlying skin to get macerated and open up. 08/16/2022: The first metatarsal head wound is closed. The lateral leg wound continues to contract and is quite a bit  smaller again this week. There is just a small, superficial opening remaining on his anterior tibial surface. 08/23/2022: The first metatarsal head wound has, by some miracle, remained closed. The lateral leg wound is substantially smaller with multiple areas of epithelialization. The anterior tibial surface wound is also quite a bit smaller and very clean. 08/30/2022: Unfortunately, his first metatarsal head wound opened up again. It happened in the same fashion as it has on prior occasions. Moisture got under dried skin/callus and created a wound when he removed his sock, taking the skin with it. The anterior tibial surface has a thick shell of hyperkeratotic skin. This has been contributing to ongoing repeat wounding events as moisture gets underneath this and causes tissue breakdown. 3/15; patient presents for follow-up. His anterior left leg wound has healed. He still has the wound to the left lateral aspect and left first met head. We have been using silver alginate and endoform to these areas under [employer]. He has no issues or complaints today. He has been taking Augmentin and reports improvement to his symptoms to the left first met  head. Electronic Signature(s) Signed: 09/07/2022 12:33:12 PM By: Geralyn Corwin DO Entered By: Geralyn Corwin on 09/07/2022 12:29:27 -------------------------------------------------------------------------------- Physical Exam Details Patient Name: Date of Service: [name]. 09/07/2022 10:00 A M Medical Record Number: [id number] Patient Account Number: [id number] WYATT, THORSTENSON ([id number]) [phone].pdf Page 7 of 19 Date of Birth/Sex: Treating RN: 1950-09-17 (72 y.o. M) Primary Care Provider: Other Clinician: [name] Referring Provider: Treating Provider/Extender: Lyndal Pulley in Treatment: 79 Constitutional respirations regular, non-labored and within target range for patient.. Cardiovascular 2+ dorsalis pedis/posterior tibialis pulses. Psychiatric pleasant and cooperative. Notes T the first met head there is an open wound with granulation tissue and slough. T the lateral leg there is a small open wound with granulation tissue present. o o The anterior tibial wound has healed. No signs of infection. Electronic Signature(s) Signed: 09/07/2022 12:33:12 PM By: Geralyn Corwin DO Entered By: Geralyn Corwin on 09/07/2022 12:29:58 -------------------------------------------------------------------------------- Physician Orders Details Patient Name: Date of Service: [name]. 09/07/2022 10:00 A M Medical Record Number: [id number] Patient Account Number: [id number] Date of Birth/Sex: Treating RN: 04-27-1951 (72 y.o. [name] Primary Care Provider: [name] Other Clinician: Referring Provider: Treating Provider/Extender: Lyndal Pulley in Treatment: 69 Verbal / Phone Orders: No Diagnosis Coding Follow-up Appointments ppointment in 1 week. - Dr Lady Gary - Room 2 Return A Anesthetic Wound #22 Left,Proximal,Lateral Lower Leg (In clinic) Topical Lidocaine 4% applied to wound  bed Bathing/ Shower/ Hygiene May shower with protection but do not get wound dressing(s) wet. Protect dressing(s) with water repellant cover (for example, large plastic bag) or a cast cover and may then take shower. Edema Control - Lymphedema / SCD / Other Avoid standing for long periods of time. Patient to wear own compression stockings every day. - on right leg; Moisturize legs daily. Compression stocking or Garment 20-30 mm/Hg pressure to: - left leg daily Off-Loading Wound #18R Left,Plantar Metatarsal head first Open toe surgical shoe to: - Front off loader Left ft Other: - minimal weight bearing left foot Wound Treatment Wound #18R - Metatarsal head first Wound Laterality: Plantar, Left Cleanser: Soap and Water 1 x Per Week/30 Days Discharge Instructions: May shower and wash wound with dial antibacterial soap and water prior to dressing change. Cleanser: Wound Cleanser 1 x Per Week/30 Days Discharge Instructions: Cleanse the wound with wound cleanser prior to applying a  clean dressing using gauze sponges, not tissue or cotton balls. Prim Dressing: Sorbalgon AG Dressing 2x2 (in/in) 1 x Per Week/30 Days ary Discharge Instructions: Apply to wound bed as instructed Secondary Dressing: Optifoam Non-Adhesive Dressing, 4x4 in 1 x Per Week/30 Days Discharge Instructions: Apply over primary dressing as directed. Secondary Dressing: Zetuvit Plus 4x8 in 1 x Per Week/30 Days MAAHIR, HORST ([id number]) [id number] Page 8 of 19 Discharge Instructions: Apply over primary dressing as directed. Compression Wrap: CoFlex Zinc Unna Boot, 4 x 6 (in/yd) 1 x Per Week/30 Days Discharge Instructions: Apply Coflex Zinc [employer] as directed. Wound #22 - Lower Leg Wound Laterality: Left, Lateral, Proximal Cleanser: Soap and Water 3 x Per Week/30 Days Discharge Instructions: May shower and wash wound with dial antibacterial soap and water prior to dressing change. Cleanser: Wound  Cleanser 3 x Per Week/30 Days Discharge Instructions: Cleanse the wound with wound cleanser prior to applying a clean dressing using gauze sponges, not tissue or cotton balls. Peri-Wound Care: Sween Lotion (Moisturizing lotion) 3 x Per Week/30 Days Discharge Instructions: Apply moisturizing lotion to the leg Topical: Skintegrity Hydrogel 4 (oz) 3 x Per Week/30 Days Discharge Instructions: Apply hydrogel as directed Prim Dressing: Endoform 2x2 in 3 x Per Week/30 Days ary Discharge Instructions: Moisten with Hydrogel or saline Secondary Dressing: ABD Pad, 8x10 (Generic) 3 x Per Week/30 Days Discharge Instructions: Apply over primary dressing as directed. Secured With: Elastic Bandage 4 inch (ACE bandage) 3 x Per Week/30 Days Discharge Instructions: Secure with ACE bandage as directed. Patient Medications llergies: No Known Drug Allergies A Notifications Medication Indication Start End 09/07/2022 lidocaine DOSE 0 - topical 4 % cream - 0 cream topical Electronic Signature(s) Signed: 09/07/2022 12:33:12 PM By: Geralyn Corwin DO Entered By: Geralyn Corwin on 09/07/2022 12:30:06 -------------------------------------------------------------------------------- Problem List Details Patient Name: Date of Service: [name]. 09/07/2022 10:00 A M Medical Record Number: [id number] Patient Account Number: [id number] Date of Birth/Sex: Treating RN: Sep 23, 1950 (72 y.o. M) Primary Care Provider: [name] Other Clinician: Referring Provider: Treating Provider/Extender: Lyndal Pulley in Treatment: 73 Active Problems ICD-10 Encounter Code Description Active Date MDM Diagnosis I87.322 Chronic venous hypertension (idiopathic) with inflammation of left lower 04/12/2021 No Yes extremity L97.828 Non-pressure chronic ulcer of other part of left lower leg with other specified 04/12/2021 No Yes severity L97.528 Non-pressure chronic ulcer of other part of left foot with  other specified 08/26/2022 No Yes severity E11.51 Type 2 diabetes mellitus with diabetic peripheral angiopathy without gangrene 04/12/2021 No Yes LEOCADIO, HEAL ([id number]) [id number] Page 9 of 19 I89.0 Lymphedema, not elsewhere classified 04/12/2021 No Yes L85.9 Epidermal thickening, unspecified 08/30/2022 No Yes Inactive Problems ICD-10 Code Description Active Date Inactive Date E11.621 Type 2 diabetes mellitus with foot ulcer 04/12/2021 04/12/2021 L97.828 Non-pressure chronic ulcer of other part of left lower leg with other specified severity 06/08/2022 06/08/2022 E11.42 Type 2 diabetes mellitus with diabetic polyneuropathy 04/12/2021 04/12/2021 L02.416 Cutaneous abscess of left lower limb 06/13/2021 06/13/2021 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 07/20/2021 07/20/2021 Resolved Problems Electronic Signature(s) Signed: 09/07/2022 12:33:12 PM By: Geralyn Corwin DO Entered By: Geralyn Corwin on 09/07/2022 12:25:17 -------------------------------------------------------------------------------- Progress Note Details Patient Name: Date of Service: [name]. 09/07/2022 10:00 A M Medical Record Number: [id number] Patient Account Number: [id number] Date of Birth/Sex: Treating RN: 12-10-1950 (72 y.o. M) Primary Care Provider: [name] Other Clinician: Referring Provider: Treating Provider/Extender: Lyndal Pulley in Treatment: 11 Subjective Chief Complaint  Information obtained from Patient Left leg and foot ulcers 04/12/2021; patient is here for wounds on his left lower leg and left plantar foot over the first metatarsal head History of Present Illness (HPI) 10/11/17; Mr. Stogsdill is a 72 year old man who tells me that in 2015 he slipped down the latter traumatizing his left leg. He developed a wound in the same spot the area that we are currently looking at. He states this closed over for the  most part although he always felt it was somewhat unstable. In 2016 he hit the same area with the door of his car had this reopened. He tells me that this is never really closed although sometimes an inflow it remains open on a constant basis. He has not been using any specific dressing to this except for topical antibiotics the nature of which were not really sure. His primary doctor did send him to see Dr. Jacinto Halim of interventional cardiology. He underwent an angiogram on 08/06/17 and he underwent a PTA and directional atherectomy of the lesser distal SFA and popliteal arteries which resulted in brisk improvement in blood flow. It was noted that he had 2 vessel runoff through the anterior tibial and peroneal. He is also been to see vascular and interventional radiologist. He was not felt to have any significant superficial venous insufficiency. Presumably is not a candidate for any ablation. It was suggested he come here for wound care. The patient is a type II diabetic on insulin. He also has a history of venous insufficiency. ABIs on the left were noncompressible in our clinic 10/21/17; patient we admitted to the clinic last week. 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 We put Iodosorb on him after an aggressive debridement and 3 layer compression. He complained of pain in his ankle and itching with is skin in fact he scratched the area on the medial calf superiorly at the rim of our wraps and he has 2 small open areas in that location today which are new. I changed his primary dressing today to silver collagen. As noted he is already had revascularization and does not have any significant superficial venous insufficiency that would be amenable to ablation 10/28/17; patient admitted to the clinic 2 weeks ago. He has a smaller Wound. Scratch injury from last week revealed. There is large wound over the tibial area. This is smaller.  Granulation looks healthy. No need for debridement. DUWANE, GEWIRTZ ([id number]) [id number] Page 10 of 19 11/04/17; the wound on the left lateral calf looks better. Improved dimensions. Surface of this looks better. We've been maintaining him and Kerlix Coban wraps. He finds this much more comfortable. Silver collagen dressing 11/11/17; left lateral Wound continues to look healthy be making progress. Using a #5 curet I removed removed nonviable skin from the surface of the wound and then necrotic debris from the wound surface. Surface of the wound continues to look healthy. ooHe also has an open area on the left great toenail bed. We've been using topical antibiotics. 11/19/17; left anterior lateral wound continues to look healthy but it's not closed. ooHe also had a small wound above this on the left leg ooInitially traumatic wounds in the setting of significant chronic venous insufficiency and stasis dermatitis 11/25/17; left anterior wounds superiorly is closed still a small wound inferiorly. 12/02/17; left anterior tibial area. Arrives today with adherent callus. Post debridement clearly not completely closed. Hydrofera Blue under 3 layer compression. 12/09/17;  left anterior tibia. Circumferential eschar however the wound bed looks stable to improved. We've been using Hydrofera Blue under 3 layer compression 12/17/17; left anterior tibia. Apparently this was felt to be closed however when the wrap was taken off there is a skin tear to reopen wounds in the same area we've been using Hydrofera Blue under 3 layer compression 12/23/17 left anterior tibia. Not close to close this week apparently the [facility] was stuck to this again. Still circumferential eschar requiring debridement. I put a contact layer on this this time under the Hydrofera Blue 12/31/17; left anterior tibia. Wound is better slight amount of hyper-granulation. Using Hydrofera Blue over Adaptic. 01/07/18;  left anterior tibia. The wound had some surface eschar however after this was removed he has no open wound.he was already revascularized by Dr. Jacinto Halim when he came to our clinic with atherectomy of the left SFA and popliteal artery. He was also sent to interventional radiology for venous reflux studies. He was not felt to have significant reflux but certainly has chronic venous changes of his skin with hemosiderin deposition around this area. He will definitely need to lubricate his skin and wear compression stocking and I've talked to him about this. READMISSION 05/26/2018 This is a now 72 year old man we cared for with traumatic wounds on his left anterior lower extremity. He had been previously revascularized during that admission by Dr. Jacinto Halim. Apparently in follow-up Dr. Jacinto Halim noted that he had deterioration in his arterial status. He underwent a stent placement in the distal left SFA on 04/22/2018. 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 He went back to the angiography suite on 04/30/2018 he underwent PTA and balloon angioplasty of the occluded left mid anterior tibial artery, thrombotic occlusion went from 100 to 0% which reconstitutes the posterior tibial artery. He had thrombectomy and aspiration of the peroneal artery. The stent placed in the distal SFA left SFA was still occluded. 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 He has been recuperating at [facility] nursing home in [facility] after the most recent hospitalization. He is going home tomorrow. He tells me that 2 weeks ago he traumatized the tip of his left fifth toe. He came in urgently for our review of this. This was a history of before I noted that Dr. Jacinto Halim had already  noted dry gangrenous changes of the left fifth toe 06/09/2018; 2-week follow-up. I did contact Dr. Jacinto Halim after his last appointment and he apparently saw 1 of Dr. Verl Dicker colleagues the next day. He does not follow-up with Dr. Jacinto Halim himself until Thursday of this week. 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 Nevertheless there is no evidence of infection no drainage and no pain. He had a new area that this week when we were signing him in today on the left anterior mid tibia area, this is in close proximity to the previous wound we have dealt with in this clinic. 06/23/2018; 2-week follow-up. I did not receive a recent note from Dr. Jacinto Halim to review today. Our office is trying to obtain this. He is apparently not planning to do further vascular interventions and wondered about compression to try and help with the patient's chronic venous insufficiency. However  we are also concerned about the arterial flow. ooHe arrives in clinic today with a new area on the left third toe. The areas on the calf/anterior tibia are close to closing. The left fifth toe is still mummified using Betadine. -In reviewing things with the patient he has what sounds like claudication with mild to moderate amount of activity. 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 I prescribed Levaquin over the phone while we attempted to arrange a plan of care. However the patient called yesterday to report he had low-grade fever and he came in today acutely. There is been a marked deterioration in the left third toe with spreading cellulitis up into the dorsal left foot. He was referred to the emergency room. Readmission: 06/29/2020 patient presents today for reevaluation here in our clinic he was previously treated by Dr. Leanord Hawking at the latter part of 2019 in 2 the beginning of 2020. Subsequently we have not seen him since that time in the interim he did have evaluation with vein and  vascular specialist specifically Dr. Bo Mcclintock who did perform quite extensive work for a left femoral to anterior tibial artery bypass. With that being said in the interim the patient has developed significant lymphedema and has wounds that he tells me have really never healed in regard to the incision site on the left leg. He also has multiple wounds on the feet for various reasons some of which is that he tends to pick at his feet. Fortunately there is no signs of active infection systemically at this time he does have some wounds that are little bit deeper but most are fairly superficial he seems to have good blood flow and overall everything appears to be healthy I see no bone exposed and no obvious signs of osteomyelitis. I do not know that he necessarily needs a x-ray at this point although that something we could consider depending on how things progress. The patient does have a history of lymphedema, diabetes, this is type II, chronic kidney disease stage III, hypertension, and history of peripheral vascular disease. 07/05/2020; patient admitted last week. Is a patient I remember from 2019 he had a spreading infection involving the left foot and we sent him to the hospital. 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 He subsequently had a left femoral to anterior tibial bypass by Dr.Cain vein and vascular. He also has severe lymphedema with chronic skin changes related to that on the left leg. The most problematic area that was new today was on the left medial great toe. This was apparently a small area last week there was purulent drainage which our intake nurse cultured. Also areas on the left medial foot and heel left lateral foot. He has 2 areas on the left medial calf left lateral calf in the setting of the severe lymphedema. 07/13/2020 on evaluation today patient appears to be doing better in my opinion compared to his last visit. The good news is  there is no signs of active infection systemically and locally I do not see any signs of infection either. He did have an x-ray which was negative that is great news he had a culture which showed MRSA but at the same time he is been on the doxycycline which has helped. I do think we may want to extend this for 7 additional days 1/25; patient admitted to the clinic a few weeks ago. 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 We have been putting him under compression his edema control is a lot better but he is severe verricused skin on the left leg. He is really done quite well he still has an open area on the left medial calf and the left medial first metatarsal head. We have been using silver collagen on the leg silver alginate on the foot 07/27/2020 upon evaluation today patient appears to be doing decently well in regard to his wounds. He still has a lot of dry skin on the left leg. Some of this is starting to peel back and I think he may be able to have them out by removing some that today. Fortunately there is no signs of active infection at this time on the left leg although on the right leg he does appear to have swelling and erythema as well as some mild warmth to touch. This does have been concerned about the possibility of cellulitis although within the differential diagnosis I do think that potentially a DVT has to be at least considered. We need to rule that out before proceeding would just call in the cellulitis. Especially since he is having pain in the posterior aspect of his calf muscle. 2/8; the patient had seen sparingly. He has severe skin changes of chronic lymphedema in the left leg thickened hyperkeratotic verrucous skin. He has an open wound on the medial part of the left first met head left mid tibia. He also has a rim of nonepithelialized skin in the anterior mid tibia. He brought in the AmLactin lotion that was been prescribed although  I am not sure under compression and its utility. There concern about cellulitis on the right lower leg the last time he was here. He was put on on antibiotics. His DVT rule out was negative. The right leg looks fine he is using his stocking on this area 08/10/2020 upon evaluation today patient appears to be doing well with regard to his leg currently. He has been tolerating the dressing changes without complication. Fortunately there is no signs of active infection which is great news. Overall very pleased with where things stand. 2/22; the patient still has an area on the medial part of the left first met his head. This looks better than when I last saw this earlier this month he has a rim of XAIDEN, FLEIG ([id number]) [id number] Page 11 of 19 epithelialization but still some surface debris. Mostly everything on the left leg is healed. There is still a vulnerable in the left mid tibia area. 08/30/2020 upon evaluation today patient appears to be doing much better in regard to his wounds on his foot. 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 Nonetheless I think we will address that today I Minna send in a prescription for him in that regard. Overall though there does not appear to be any signs of significant worsening. 09/07/2020 on evaluation today patient's wounds over his left foot appear to be doing excellent. I do not see any signs of infection there is some callus buildup this can require debridement for certain but overall I feel like he is managing quite nicely. He still using the AmLactin cream which has been beneficial for him as well. 3/22; left foot wound is closed. There is no open area here. He is using ammonium lactate  lotion to the lower extremities to help exfoliate dry cracked skin. He has compression stockings from [facility]  therapy in [place]. The wound on the medial part of his left first met head is healed today. READMISSION 04/12/2021 Mr. Bodner is a patient we know fairly well he had a prolonged stay in clinic in 2019 with wounds on his left lateral and left anterior lower extremity in the setting of chronic venous insufficiency. More recently he was here earlier this year with predominantly an area on his left foot first metatarsal head plantar and he says the plantar foot broke down on its not long after we discharged him but he did not come back here. The last few months areas of broken down on his left anterior and again the left lateral lower extremity. The leg itself is very swollen chronically enlarged a lot of hyperkeratotic dry Berry Q skin in the left lower leg. His edema extends well into the thigh. He was seen by Dr. Randie Heinz. He had ABIs on 03/02/2021 showing an ABI on the right of 1 with a TBI of 0.72 his ABI in the left at 1.09 TBI of 0.99. Monophasic and biphasic waveforms on the right. On the left monophasic waveforms were noted he went on to have an angiogram on 03/27/2021 this showed the aortic aortic and iliac segments were free of flow-limiting stenosis the left common femoral vein to evaluate the left femoral to anterior tibial artery bypass was unobstructed the bypass was patent without any areas of stenosis. We discharged the patient in bilateral juxta lite stockings but very clearly that was not sufficient to control the swelling and maintain skin integrity. He is clearly going to need compression pumps. 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 This is fortunate because he is on his feet for long periods of time. 10/27; patient comes in with our intake nurse reporting copious amount of green drainage from the left anterior mid tibia the left dorsal foot and to a lesser extent the left medial mid tibia. We left the compression wrap on all week  for the amount of edema in his left leg is quite a bit better. We use silver alginate as the primary dressing 11/3; edema control is good. Left anterior lower leg left medial lower leg and the plantar first metatarsal head. The left anterior lower leg required debridement. Deep tissue culture I did of this wound showed MRSA I put him on 10 days of doxycycline which she will start today. We have him in compression wraps. He has a security card and [employer] however he is retiring on November 15. We will need to then get him into a better offloading boot for the left foot perhaps a total contact cast 11/10; edema control is quite good. Left anterior and left medial lower leg wounds in the setting of chronic venous insufficiency and lymphedema. He also has a substantial area over the left plantar first metatarsal head. 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 He has significant hypergranulation on the left plantar foot wound. The patient is a diabetic but he does not have significant PAD 11/17; edema control is quite good. Left anterior and left medial lower leg wounds look better. The really concerning area remains the area on the left plantar first metatarsal head. He has a rim of epithelialization.  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 Starting today with a forefoot off loader but . possibly a total contact cast. 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 Arrives with a very significant odor from I think all of the wounds on the left leg and the left foot. Because of this I did not put a total contact cast on him today but will could still consider this. His wife was having  cataract surgery which is the reason he missed the appointment 12/6. I saw this man 5 days ago with a swelling below the popliteal fossa. I thought he actually might have a Baker's cyst however the DVT rule out study that we could arrange right away was negative the technician told me this was not a ruptured Baker's cyst. We attempted to get this aspirated by under ultrasound guidance in interventional radiology however all they did was an ultrasound however it shows an extensive fluid collection 62 x 8 x 9.4 in the left thigh and left calf. The patient states he thinks this started 8 days ago or so but he really is not complaining of any pain, fever or systemic symptoms. He has not ha 12/20; after some difficulty I managed to get the patient into see Dr. Randie Heinz. Eventually he was taken into the hospital and had a drain put in the fluid collection below his left knee posteriorly extending into the posterior thigh. He still has the drain in place. Culture of this showed moderate staff aureus few Morganella and few Klebsiella he is now on doxycycline and ciprofloxacin as suggested by infectious disease he is on this for a month. The drain will remain in place until it stops draining 12/29; he comes in today with the 1 wound on his left leg and the area on the left plantar first met head significantly smaller. Both look healthy. He still has the drain in the left leg. He says he has to change this daily. Follows up with Dr. Randie Heinz on January 11. 06/29/2021; the wounds that I am following on the left leg and left first met head continued to be quite healthy. However the area where his inferior drain is in place had copious amounts of drainage which was green in color. The wound here is larger. Follows up with Dr. Pascal Lux of vein and vascular his surgeon next week as well as infectious disease. He remains on ciprofloxacin and doxycycline. He is not complaining of excessive pain in either one of the drain  areas 1/12; the patient saw vascular surgery and infectious disease. Vascular surgery has left the drain in place as there was still some notable drainage still see him back in 2 weeks. Dr. Dorthula Perfect stop the doxycycline and ciprofloxacin and I do not believe he follows up with them at this point. Culture I did last week showed both doxycycline resistant MRSA and Pseudomonas not sensitive to ciprofloxacin although only in rare titers 1/19; the patient's wound on the left anterior lower leg is just about healed. We have continued healing of the area that was medially on the left leg. Left first plantar metatarsal head continues to get smaller. The major problem here is his 2 drain sites 1 on the left upper calf and lateral  thigh. There is purulent drainage still from the left lateral thigh. I gave him antibiotics last week but we still have recultured. He has the drain in the area I think this is eventually going to have to come out. I suspect there will be a connecting wound to heal here perhaps with improved VAc 1/26; the patient had his drain removed by vein and vascular on 1/25/. This was a large pocket of fluid in his left thigh that seem to tunnel into his left upper calf. 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 His mention his Penrose drain was removed today. He now has a tunneling wound on his left calf and left thigh. Both of these probe widely towards each other although I cannot really prove that they connect. Both wounds on his lower leg anteriorly are closed and his area over the first metatarsal head on his right foot continues to improve. We are using Hydrofera Blue here. He also saw infectious disease culture of the abscess they noted was polymicrobial with MRSA, Morganella and Klebsiella he was treated with doxycycline and ciprofloxacin for 4 weeks ending on 07/03/2021. They did not recommend any further antibiotics. Notable that while he still had the Penrose drain in  place last week he had purulent drainage coming out of the inferior IandD site this grew [facility], MRSA and Pseudomonas but there does not appear to be any active infection in this area today with the drain out and he is not systemically unwell 2/2; with regards to the drain sites the superior one on the thigh actually is closed down the one on the upper left lateral calf measures about 8 and half [name], [name] ([id number]) [id number] Page 12 of 19 centimeters which is an improvement seems to be less prominent although still with a lot of drainage. The only remaining wound is over the first metatarsal head on the left foot and this looks to be continuing to improve with Hydrofera Blue. 2/9; the area on his plantar left foot continues to contract. Callus around the wound edge. The drain sites specifically have not come down in depth. We put the wound VAC on Monday he changed the canister late last night our intake nurse reported a pocket of fluid perhaps caused by our compression wraps 2/16; continued improvement in left foot plantar wound. drainage site in the calf is not improved in terms of depth (wound vac) 2/23; continued improvement in the left foot wound over the first metatarsal head. With regards to the drain sites the area on his thigh laterally is healed however the open area on his calf is small in terms of circumference by still probes in by about 15 cm. Within using the wound VAC. Hydrofera Blue on his foot 08/24/2021: The left first metatarsal head wound continues to improve. The wound bed is healthy with just some surrounding callus. Unfortunately the open drain site on his calf remains open and tunnels at least 15 cm (the extent of a Q-tip). This is despite several weeks of wound VAC treatment. Based on reading back through the notes, there has been really no significant change in the depth of the wound, although the orifice is smaller and the more  cranial wound on his thigh has closed. I suspect the tunnel tracks nearly all the way to this location. 08/31/2021: Continued improvement in the left first metatarsal head wound. There has been absolutely no improvement to the long tunnel from his open drain site on his  calf. We have tried to get him into see vascular surgery sooner to consider the possibility of simply filleting the tract open and allowing it to heal from the bottom up, likely with a wound VAC. They have not yet scheduled a sooner appointment than his current mid April 09/14/2021: He was seen by vascular surgery and they took him to the operating room last week. They opened a portion of the tunnel, but did not extend the entire length of the known open subcutaneous tract. I read Dr. Darcella Cheshire operative note and it is not clear from that documentation why only a portion of the tract was opened. The heaped up granulation tissue was curetted and removed from at least some portion of the tract. They did place a wound VAC and applied an Unna boot to the leg. The ulcer on his left first metatarsal head is smaller today. The bed looks good and there is just a small amount of surrounding callus. 09/21/2021: The ulcer on his left first metatarsal head looks to be stalled. There is some callus surrounding the wound but the wound bed itself does not appear particularly dynamic. The tunnel tract on his lateral left leg seems to be roughly the same length or perhaps slightly smaller but the wound bed appears healthy with good granulation tissue. He opened up a new wound on his medial thigh and the site of a prior surgical incision. He says that he did this unconsciously in his sleep by scratching. 09/28/2021: Unfortunately, the ulcer on his left first metatarsal head has extended underneath the callus toward the dorsum of his foot. The medial thigh wounds are roughly the same. The tunnel on his lateral left leg continues to be problematic; it is longer  than we are able to actually probe with a Q-tip. I am still not certain as to why Dr. Randie Heinz did not open this up entirely when he took the patient to the operating room. We will likely be back in the same situation with just a small superficial opening in a long unhealed tract, as the open portion is granulating in nicely. 10/02/2021: The patient was initially scheduled for a nurse visit, but we are also applying a total contact cast today. The plantar foot wound looks clean without significant accumulated callus. We have been applying Prisma silver collagen to the site. 10/05/2021: The patient is here for his first total contact cast change. We have tried using gauze packing strips in the tunnel on his lateral leg wound, but this does not seem to be working any better than the white VAC foam. The foot ulcer looks about the same with minimal periwound callus. Medial thigh wound is clean with just some overlying eschar. 10/12/2021: The plantar foot wound is stable without any significant accumulation of periwound callus. The surface is viable with good granulation tissue. The medial thigh wounds are much smaller and are epithelializing. On the other hand, he had purulent drainage coming from the tunnel on his lateral leg. He does go back to see Dr. Randie Heinz next week and is planning to ask him why the wound tunnel was not completely opened at the time of his most recent operation. 10/19/2021: The plantar foot wound is markedly improved and has epithelial tissue coming through the surface. The medial thigh wounds are nearly closed with just a tiny open area. He did see Dr. Randie Heinz earlier this week and apparently they did discuss the possibility of opening the sinus tract further and enabling a wound VAC application.  Apparently there are some limits as to what Dr. 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 I think if we could get the tract open to the level of the popliteal  fossa, this would greatly aid in her ability to get this chart closed. That being said, however, today when I probed the tract with a Q-tip, I was not able to insert the entirety of the Q-tip as I have on previous occasions. The tunnel is shorter by about 4 cm. The surface is clean with good granulation tissue and no further episodes of purulent drainage. 10/30/2021: Last week, the patient underwent surgery and had the long tract in his leg opened. There was a rind that was debrided, according to the operative report. His medial thigh ulcers are closed. The plantar foot wound is clean with a good surface and some built up surrounding callus. 11/06/2021: The overall dimensions of the large wound on his lateral leg remain about the same, but there is good granulation tissue present and the tunneling is a little bit shorter. He has a new wound on his anterior tibial surface, in the same location where he had a similar lesion in the past. The plantar foot wound is clean with some buildup surrounding callus. Just toward the medial aspect of his foot, however, there is an area of darkening that once debrided, revealed another opening in the skin surface. 11/13/2021: The anterior tibial surface wound is closed. The plantar foot wound has some surrounding callus buildup. The area of darkening that I debrided last week and revealed an opening in the skin surface has closed again. The tunnel in the large wound on his lateral leg has come in by about 3 cm. There is healthy granulation tissue on the entire wound surface. 11/23/2021: The patient was out of town last week and did wet-to-dry dressings on his large wound. 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 Unfortunately, he picked open the wound on his left medial thigh. He says that it was itching and he just could not stop scratching it until it was open again. The wound on his plantar foot is  smaller and has not accumulated a tremendous amount of callus. The lateral leg wound is shallower and the tunnel has also decreased in depth. There is just a little bit of slough accumulation on the surface. 11/30/2021: Another portion of his left medial thigh has been opened up. All of these wounds are fairly superficial with just a little bit of slough and eschar accumulation. The wound on his plantar foot is almost closed with just a bit of eschar and periwound callus accumulation. The lateral leg wound is nearly flush with the surrounding skin and the tunnel is markedly shallower. 12/07/2021: There is just 1 open area on his left medial thigh. It is clean with just a little bit of perimeter eschar. The wound on his plantar foot continues to contract and just has some eschar and periwound callus accumulation. The lateral leg wound is closing at the more distal aspect and the tunnel is smaller. The surface is nearly flush with the surrounding skin and it has a good bed of granulation tissue. 12/14/2021: The thigh and foot wounds are closed. The lateral leg wound has closed over approximately half of its length. The tunnel continues to contract and the surface is now flush with the surrounding skin. The wound bed has robust granulation tissue. 12/22/2021: The  thigh and foot wounds have reopened. The foot wound has a lot of callus accumulation around and over it. The thigh wound is tiny with just a little bit of slough in the wound bed. The lateral leg wound continues to contract. His vascular surgeon took the wound VAC off earlier in the week and the patient has been doing wet-to-dry dressings. There is a little slough accumulation on the surface. The tunnel is about 3 cm in depth at this point. 12/28/2021: The thigh wound is closed again. The foot wound has some callus that subsequently has peeled back exposing just a small slit of a wound. The lateral leg wound Is down to about half the size that it  originally was and the tunnel is down to about half a centimeter in depth. 01/04/2022: The thigh wound remains closed. The foot wound has heavy callus overlying the wound site. Once this was debrided, the wound was found to be closed. The lateral leg wound is smaller again this week and very superficial. No tunnel could be identified. [name], [name] ([id number]) [id number] Page 13 of 19 01/12/2022: The thigh and foot wounds both remain closed. The lateral leg wound is now nearly flush with the skin surface. There is good granulation tissue present with a light layer of slough. 01/19/2022: Due to the way his wrap was placed, the patient did not change the dressing on his thigh at all and so the foam was saturated and his skin is macerated. There is a light layer of slough on the wound surface. The underlying granulation tissue is robust and healthy-appearing. He has heavy callus buildup at the site of his first metatarsal head wound which is still healed. 02/01/2022: He has been in silver alginate. When he removed the dressing from his thigh wound, however, some leg, superficially reopening a portion of the wound that had healed. In addition, underneath the callus at his left first metatarsal head, there appears to be a blister and the wound appears to be open again. 02/08/2022: The lateral leg wound has contracted substantially. There is eschar and a light layer of slough present. He says that it is starting to pull and is uncomfortable. On inspection, there is some puckering of the scar and the eschar is quite dry; this may account for his symptoms. On his first metatarsal head, the wound is much smaller with just some eschar on the surface. The callus has not reaccumulated. He reports that he had a blister come up on his medial thigh wound at the distal aspect. It popped and there is now an opening in his skin again. Looking back through his library of wound photos, there is  what looks like a permanent suture just deep to this location and it may be trying to erode through. We have been using silver alginate on his wounds. 02/15/2022: The lateral leg wound is about half the size it was last week. It is clean with just a little perimeter eschar and light slough. The wound on his first metatarsal head is about the same with heavy callus overlying it. The medial thigh wound is closed again. He does have some skin changes on the top of his foot that looks potentially yeast related. 02/22/2022: The skin on the top of his foot improved with the use of a topical antifungal. The lateral leg wound continues to contract and is again smaller this week. There is a little bit of slough and eschar on the surface. The first metatarsal head wound is  a little bit smaller but has reaccumulated a thick callus over the top. He decided to try to trim his toenail and ultimately took the entire nail off of his left great toe. 03/02/2022: His lateral leg wound continues to improve, as does the wound on his left great toe. Unfortunately, it appears that somehow his foot got wet and moisture seeped in through the opening causing his skin to lift. There is a large wound now overlying his first metatarsal on both the plantar, medial, and dorsal portion of his foot. There is necrotic tissue and slough present underneath the shaggy macerated skin. 03/08/2022: The lateral leg wound is smaller again today. There is just a light layer of slough and eschar on the surface. The great toe wound is smaller again today. The first metatarsal wound is a little bit smaller today and does not look nearly as necrotic and macerated. There is still slough and nonviable tissue present. 03/15/2022: The lateral leg wound is narrower and just has a little bit of light slough buildup. The first metatarsal wound still has a fair amount of moisture affecting the periwound skin. The great toe wound is healed. 03/22/2022: The  lateral leg wound is now isolated to just at the level of his knee. There is some eschar and slough accumulation. The first metatarsal head wound has epithelialized tremendously and is about half the size that it was last week. He still has some maceration on the top of his foot and a fungal odor is present. 03/29/2022: T oday the patient's foot was macerated, suggesting that the cast got wet. The patient has also been picking at his dry skin and has enlarged the wound on his left lateral leg. In the time between having his cast removed and my evaluation, he had picked more dry skin and opened up additional wounds on his Achilles area and dorsal foot. The plantar first metatarsal head wound, however, is smaller and clean with just macerated callus around the perimeter and light slough on the surface. The lateral leg wound measured a little bit larger but is also fairly clean with eschar and minimal slough. 04/02/2022: The patient had vascular studies done last Friday and so his cast was not applied. He is here today to have that done. 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 04/05/2022: Both wounds are smaller and quite clean. There is just a little biofilm on the lateral leg wound. 10/20; the patient has a wound on the left lateral surgical incision at the level of his lateral knee this looks clean and improved. He is using silver alginate. He also has an area on his left medial foot for which she is using Hydrofera Blue under a total contact cast both wounds are measuring smaller 04/20/2022: The plantar foot wound has contracted considerably and is very close to closing. The lateral leg wound was measured a little larger, but there was a tiny open area that was included in the measurements that was not included last week. He has some eschar around the perimeter but otherwise the wound looks clean. 04/27/2022: The lateral leg wound looks better this week. He says that midweek, he felt it  was very dry and began applying hydrogel to the site. I think this was beneficial. The foot wound is nearly closed underneath a thick layer of dry skin and callus. 05/04/2022: The foot wound is healed. He has developed a new small ulcer on his anterior tibial surface about midway up his leg. It has  a little slough on the surface. The lateral leg wound still is fairly dry, but clean with just a little biofilm on the surface. 05/11/2022: The wound on his foot reopened on Wednesday. A large blister formed which then broke open revealing the fat layer underneath. The ulcer on his anterior tibial surface is a little bit larger this week. The lateral leg wound has much better moisture balance this week. Fortunately, prior to his foot wound reopening, he did get the cast made for his orthotic. 05/15/2022: Already, the left medial foot wound has improved. The tissue is less macerated and the surface is clean. The ulcer on his anterior tibial surface continues to enlarge. This seems likely secondary to accumulated moisture. The lateral leg wound continues to have an improved moisture balance with the use of collagen. 05/25/2022: The medial foot wound continues to contract. It is now substantially smaller with just a little slough on the surface. The anterior tibial surface wound continues to enlarge further. Once again, this seems to be secondary to moisture. The lateral leg wound does not seem to be changing much in size, but the moisture balance is better. 06/01/2022: The anterior tibial wound is closed. The medial foot wound is down to just a very small, couple of millimeters, opening. The lateral leg wound has good moisture balance, but remains unchanged in size. 12/15; the patient's anterior tibial wound has reopened, however the area on his right first metatarsal head is closed. The major wound is actually on the superior part of his surgical wound in the left lateral thigh. Not a completely viable  surface under illumination. This may at some point require a debridement I think he is currently using Prisma. As noted the left medial foot wound has closed 06/14/2022: The anterior tibial wound has closed. The lateral leg wound has a better surface but is basically unchanged in size. The left medial foot wound has reopened. It looks as though there was some callus accumulation and moisture got under the callus which caused the tissue to break down again. 06/21/2022: A new wound has opened up just distal to the previous anterior tibial wound. It is small but has hypertrophic granulation tissue present. The lateral leg wound is a little bit narrower and has a layer of slough on the surface. The left medial foot wound is down to just a pinhole. His custom orthotics should be available next week. 06/28/2022: The wound on his first metatarsal head has healed. He has developed a new small wound on his medial lower leg, in an old scar site. The lateral leg wound continues to contract but continues to accumulate slough, as well. [name], [name] ([id number]) [id number] Page 14 of 19 07/03/2022: Despite wearing his custom orthopedic shoes, he managed to reopen the wound on his first metatarsal head. He says he thinks his foot got wet and then some skin lifted up and he peeled this away. Both of the lower leg wounds are smaller and have some dry eschar on the surface. The lateral leg wound is quite a bit narrower today. 07/12/2022: The medial lower leg wound is closed. The anterior lower leg wound has contracted considerably. The lateral upper leg wound is narrower with a layer of slough on the surface. The first metatarsal head wound is also smaller, but had copious drainage which saturated the foam border dressing and resulted in some periwound tissue maceration. Fortunately there was no breakdown at this site. 07/19/2022: The lower leg shows signs of significant maceration; I  think he  must be sweating excessively inside his cast. There are several areas of skin breakdown present. The wound on his foot is smaller and that on his lateral leg is narrower and is shorter by about a centimeter. 07/26/2022: Last week we used a zinc Coflex wrap prior to applying his total contact cast and this has had the effect of keeping his skin from getting macerated this week. The anterior leg wound has epithelialized substantially. The lateral leg wound is significantly smaller with just a bit of slough on the surface. The first metatarsal head wound is also smaller this week. 08/02/2022: The anterior leg wound was closed on arrival, but while he was sitting in the room, he picked it open again. The lateral leg wound is smaller with just a little slough on the surface and the first metatarsal head wound has contracted further, as well. 08/09/2022: The first metatarsal head wound is covered with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

## 2022-10-11 NOTE — Progress Notes (Signed)
[name], [name] ([id number]) [id number] Page 1 of 8 Visit Report for 10/04/2022 Arrival Information Details Patient Name: Date of Service: [name], [name] 10/04/2022 10:45 A M Medical Record Number: [id number] Patient Account Number: [id number] Date of Birth/Sex: Treating RN: March 04, 1951 (72 y.o. M) Primary Care Annitta Fifield: [name] Other Clinician: Referring Lana Flaim: Treating Burnett Lieber/Extender: [name] in Treatment: 23 Visit Information History Since Last Visit All ordered tests and consults were completed: No Patient Arrived: Ambulatory Added or deleted any medications: No Arrival Time: 10:37 Any new allergies or adverse reactions: No Accompanied By: self Had a fall or experienced change in No Transfer Assistance: None activities of daily living that may affect Patient Identification Verified: Yes risk of falls: Secondary Verification Process Completed: Yes Signs or symptoms of abuse/neglect since last visito No Patient Requires Transmission-Based Precautions: No Hospitalized since last visit: No Patient Has Alerts: Yes Implantable device outside of the clinic excluding No cellular tissue based products placed in the center since last visit: Pain Present Now: No Electronic Signature(s) Signed: 10/04/2022 11:49:34 AM By: Dayton Scrape Entered By: Dayton Scrape on 10/04/2022 10:37:57 -------------------------------------------------------------------------------- Encounter Discharge Information Details Patient Name: Date of Service: [name] Grapes. 10/04/2022 10:45 A M Medical Record Number: [id number] Patient Account Number: [id number] Date of Birth/Sex: Treating RN: [birth year] (72 y.o. [name] Primary Care Real Cona: [name] Other Clinician: Referring Adesuwa Osgood: Treating Rayelynn Loyal/Extender: [name] in Treatment: 76 Encounter Discharge Information Items Post Procedure  Vitals Discharge Condition: Stable Temperature (F): 97.8 Ambulatory Status: Ambulatory Pulse (bpm): 68 Discharge Destination: Home Respiratory Rate (breaths/min): 20 Transportation: Private Auto Blood Pressure (mmHg): 152/82 Accompanied By: self Schedule Follow-up Appointment: Yes Clinical Summary of Care: Patient Declined Electronic Signature(s) Signed: 10/11/2022 1:56:54 PM By: Brenton Grills Entered By: Brenton Grills on 10/04/2022 11:29:33 -------------------------------------------------------------------------------- Lower Extremity Assessment Details Patient Name: Date of Service: [name], [name] 10/04/2022 10:45 A M Medical Record Number: [id number] Patient Account Number: [id number] Date of Birth/Sex: Treating RN: 1950/08/24 (72 y.o. [name] Primary Care Aamori Mcmasters: [name] Other Clinician: Referring Andrika Peraza: Treating Dameisha Tschida/Extender: [name] in Treatment: 77 Edema Assessment Assessed: [name]: No] [name]: No] L[LeftHARJIT, LEIDER ([id number])] [Right: [id number] Page 2 of 8] Edema: [Left: Ye] [Right: s] Calf Left: Right: Point of Measurement: 41 cm From Medial Instep 44.1 cm Ankle Left: Right: Point of Measurement: 10 cm From Medial Instep 29 cm Vascular Assessment Pulses: Dorsalis Pedis Palpable: [Left:Yes] Electronic Signature(s) Signed: 10/04/2022 3:58:28 PM By: [name] Entered By: [name] on 10/04/2022 11:00:06 -------------------------------------------------------------------------------- Multi Wound Chart Details Patient Name: Date of Service: [name] Grapes. 10/04/2022 10:45 A M Medical Record Number: [id number] Patient Account Number: [id number] Date of Birth/Sex: Treating RN: 02-05-1951 (72 y.o. M) Primary Care Brundidge Grivas: [name] Other Clinician: Referring Mahasin Riviere: Treating Alondra Vandeven/Extender: [name] in Treatment:  77 Vital Signs Height(in): 74 Capillary Blood Glucose(mg/dl): 536 Weight(lbs): 644 Pulse(bpm): 68 Body Mass Index(BMI): 30.6 Blood Pressure(mmHg): 152/82 Temperature(F): 97.8 Respiratory Rate(breaths/min): 20 [18R:Photos:] [N/A:N/A] Left, Plantar Metatarsal head first Left, Proximal, Lateral Lower Leg N/A Wound Location: Gradually Appeared Bump N/A Wounding Event: Diabetic Wound/Ulcer of the Lower Cyst N/A Primary Etiology: Extremity Glaucoma, Sleep Apnea, Glaucoma, Sleep Apnea, N/A Comorbid History: Hypertension, Peripheral Arterial Hypertension, Peripheral Arterial Disease, Peripheral Venous Disease, Disease, Peripheral Venous Disease, Type II Diabetes, Gout, Osteoarthritis, Type II Diabetes, Gout, Osteoarthritis, Neuropathy Neuropathy 08/23/2020 06/03/2021 N/A Date Acquired: 50 69 N/A Weeks of Treatment: Open Open N/A Wound  Status: Yes No N/A Wound Recurrence: No Yes N/A Clustered Wound: N/A 3 N/A Clustered Quantity: 0.6x1.3x0.1 4x1x0.1 N/A Measurements L x W x D (cm) 0.613 3.142 N/A A (cm) : rea 0.061 0.314 N/A Volume (cm) : 95.70% -90.50% N/A % Reduction in Area: 97.80% 76.20% N/A % Reduction in Volume: Grade 2 Full Thickness With Exposed Support N/A Classification: Structures Medium Medium N/A Exudate Amount: Serosanguineous Serosanguineous N/A Exudate Type: red, brGlynn [place] N/A Exudate Color: Gardella, Emmerson W ([id number]) [id number] Page 3 of 8 Flat and Intact Fibrotic scar, thickened scar N/A Wound Margin: Large (67-100%) Large (67-100%) N/A Granulation Amount: Red Red, Pink N/A Granulation Quality: Small (1-33%) Small (1-33%) N/A Necrotic Amount: Fat Layer (Subcutaneous Tissue): Yes Fat Layer (Subcutaneous Tissue): Yes N/A Exposed Structures: Fascia: No Fascia: No Tendon: No Tendon: No Muscle: No Muscle: No Joint: No Joint: No Bone: No Bone: No Medium (34-66%) Small (1-33%)  N/A Epithelialization: Debridement - Selective/Open Wound Debridement - Selective/Open Wound N/A Debridement: Pre-procedure Verification/Time Out 11:03 11:03 N/A Taken: Lidocaine 4% T opical Solution Lidocaine 4% Topical Solution N/A Pain Control: Callus, [facility] N/A Tissue Debrided: Non-Viable Tissue Non-Viable Tissue N/A Level: 0.78 4 N/A Debridement A (sq cm): rea Curette Curette N/A Instrument: Minimum Minimum N/A Bleeding: Pressure Pressure N/A Hemostasis A chieved: Procedure was tolerated well Procedure was tolerated well N/A Debridement Treatment Response: 0.6x1.3x0.1 4x1x0.1 N/A Post Debridement Measurements L x W x D (cm) 0.061 0.314 N/A Post Debridement Volume: (cm) Callus: Yes Scarring: Yes N/A Periwound Skin Texture: Dry/Scaly: Yes Dry/Scaly: Yes N/A Periwound Skin Moisture: Maceration: No No Abnormalities Noted Hemosiderin Staining: Yes N/A Periwound Skin Color: No Abnormality No Abnormality N/A Temperature: Debridement Debridement N/A Procedures Performed: Treatment Notes Electronic Signature(s) Signed: 10/04/2022 11:27:23 AM By: [name] MD FACS Entered By: [name] on 10/04/2022 11:27:23 -------------------------------------------------------------------------------- Multi-Disciplinary Care Plan Details Patient Name: Date of Service: [name] Grapes. 10/04/2022 10:45 A M Medical Record Number: [id number] Patient Account Number: [id number] Date of Birth/Sex: Treating RN: 08-15-1950 (72 y.o. [name] Primary Care Lillar Bianca: [name] Other Clinician: Referring Ethelyn Cerniglia: Treating Mykelti Goldenstein/Extender: [name] in Treatment: 41 Multidisciplinary Care Plan reviewed with physician Active Inactive Venous Leg Ulcer Nursing Diagnoses: Knowledge deficit related to disease process and management Potential for venous Insuffiency (use before diagnosis confirmed) Goals: Patient will maintain  optimal edema control Date Initiated: 07/27/2021 Target Resolution Date: 02/23/2023 Goal Status: Active Interventions: Assess peripheral edema status every visit. Treatment Activities: Therapeutic compression applied : 07/27/2021 Notes: Wound/Skin Impairment Nursing Diagnoses: Impaired tissue integrity DECKARD, STUBER ([id number]) [id number] Page 4 of 8 Knowledge deficit related to ulceration/compromised skin integrity Goals: Patient will have a decrease in wound volume by X% from date: (specify in notes) Date Initiated: 04/12/2021 Date Inactivated: 01/04/2022 Target Resolution Date: 04/23/2021 Goal Status: Met Patient/caregiver will verbalize understanding of skin care regimen Date Initiated: 01/04/2022 Target Resolution Date: 02/23/2023 Goal Status: Active Ulcer/skin breakdown will have a volume reduction of 30% by week 4 Date Initiated: 04/12/2021 Date Inactivated: 04/27/2021 Target Resolution Date: 04/27/2021 Goal Status: Unmet Unmet Reason: infection Ulcer/skin breakdown will have a volume reduction of 50% by week 8 Date Initiated: 04/27/2021 Date Inactivated: 06/29/2021 Target Resolution Date: 06/24/2021 Goal Status: Met Interventions: Assess patient/caregiver ability to obtain necessary supplies Assess patient/caregiver ability to perform ulcer/skin care regimen upon admission and as needed Assess ulceration(s) every visit Notes: Electronic Signature(s) Signed: 10/04/2022 3:58:28 PM By: [name] Entered By: [name] on 10/04/2022 11:07:42 -------------------------------------------------------------------------------- Pain Assessment Details Patient  Name: Date of Service: [name], [name] 10/04/2022 10:45 A M Medical Record Number: [id number] Patient Account Number: [id number] Date of Birth/Sex: Treating RN: 02-19-1951 (72 y.o. M) Primary Care Takara Sermons: [name] Other Clinician: Referring Zainab Crumrine: Treating Brylyn Novakovich/Extender:  [name] in Treatment: 87 Active Problems Location of Pain Severity and Description of Pain Patient Has Paino No Site Locations Pain Management and Medication Current Pain Management: Electronic Signature(s) Signed: 10/04/2022 11:49:34 AM By: Dayton Scrape Entered By: Dayton Scrape on 10/04/2022 10:38:30 [name] ([id number]) [id number] Page 5 of 8 -------------------------------------------------------------------------------- Patient/Caregiver Education Details Patient Name: Date of Service: [name], [name] 4/11/2024andnbsp10:45 A M Medical Record Number: [id number] Patient Account Number: [id number] Date of Birth/Gender: Treating RN: [birth year] (72 y.o. [name] Primary Care Physician: [name] Other Clinician: Referring Physician: Treating Physician/Extender: [name] in Treatment: 45 Education Assessment Education Provided To: Patient Education Topics Provided Wound/Skin Impairment: Methods: Explain/Verbal Responses: State content correctly Electronic Signature(s) Signed: 10/11/2022 1:56:54 PM By: Brenton Grills Entered By: Brenton Grills on 10/04/2022 11:25:49 -------------------------------------------------------------------------------- Wound Assessment Details Patient Name: Date of Service: [name] Grapes. 10/04/2022 10:45 A M Medical Record Number: [id number] Patient Account Number: [id number] Date of Birth/Sex: Treating RN: 09-12-1950 (72 y.o. M) Primary Care Roland Lipke: [name] Other Clinician: Referring [name]: Treating [name]/Extender: [name] in Treatment: 77 Wound Status Wound Number: 18R Primary Diabetic Wound/Ulcer of the Lower Extremity Etiology: Wound Location: Left, Plantar Metatarsal head first Wound Open Wounding Event: Gradually Appeared Status: Date Acquired: 08/23/2020 Comorbid Glaucoma, Sleep Apnea, Hypertension,  Peripheral Arterial Disease, Weeks Of Treatment: 77 History: Peripheral Venous Disease, Type II Diabetes, Gout, Osteoarthritis, Clustered Wound: No Neuropathy Photos Wound Measurements Length: (cm) 0.6 Width: (cm) 1.3 Depth: (cm) 0.1 Area: (cm) 0.613 Volume: (cm) 0.061 % Reduction in Area: 95.7% % Reduction in Volume: 97.8% Epithelialization: Medium (34-66%) Tunneling: No Undermining: No Wound Description Classification: Grade 2 Wound Margin: Flat and Intact Exudate Amount: Medium Exudate Type: Serosanguineous Exudate Color: red, brown [name], [name] ([id number]) Foul Odor After Cleansing: No Slough/Fibrino Yes [id number] Page 6 of 8 Wound Bed Granulation Amount: Large (67-100%) Exposed Structure Granulation Quality: Red Fascia Exposed: No Necrotic Amount: Small (1-33%) Fat Layer (Subcutaneous Tissue) Exposed: Yes Necrotic Quality: Adherent Slough Tendon Exposed: No Muscle Exposed: No Joint Exposed: No Bone Exposed: No Periwound Skin Texture Texture Color No Abnormalities Noted: No No Abnormalities Noted: Yes Callus: Yes Temperature / Pain Temperature: No Abnormality Moisture No Abnormalities Noted: No Dry / Scaly: Yes Maceration: No Treatment Notes Wound #18R (Metatarsal head first) Wound Laterality: Plantar, Left Cleanser Soap and Water Discharge Instruction: May shower and wash wound with dial antibacterial soap and water prior to dressing change. Wound Cleanser Discharge Instruction: Cleanse the wound with wound cleanser prior to applying a clean dressing using gauze sponges, not tissue or cotton balls. Peri-Wound Care Topical Primary Dressing Sorbalgon AG Dressing 2x2 (in/in) Discharge Instruction: Apply to wound bed as instructed Secondary Dressing Optifoam Non-Adhesive Dressing, 4x4 in Discharge Instruction: Apply over primary dressing as directed. Zetuvit Plus 4x8 in Discharge Instruction: Apply over primary dressing  as directed. Secured With Compression Wrap CoFlex [employer], 4 x 6 (in/yd) Discharge Instruction: Apply Coflex [employer] as directed. Compression Stockings Add-Ons Electronic Signature(s) Signed: 10/04/2022 3:58:28 PM By: [name] Entered By: [name] on 10/04/2022 10:59:30 -------------------------------------------------------------------------------- Wound Assessment Details Patient Name: Date of Service: [name], [name] 10/04/2022 10:45 A M Medical Record Number: [id number] Patient  Account Number: [id number] Date of Birth/Sex: Treating RN: 09/04/1950 (72 y.o. M) Primary Care Cecilio Ohlrich: [name] Other Clinician: Referring Yannis Broce: Treating [name]/Extender: [name] in Treatment: 77 Wound Status Wound Number: 22 Primary Cyst Etiology: Wound Location: Left, Proximal, Lateral Lower Leg Wound Open Wounding Event: Bump Status: Date Acquired: 06/03/2021 AMDREW, [name] ([id number]) [id number] Page 7 of 8 Date Acquired: 06/03/2021 Comorbid Glaucoma, Sleep Apnea, Hypertension, Peripheral Arterial Disease, Weeks Of Treatment: 69 History: Peripheral Venous Disease, Type II Diabetes, Gout, Osteoarthritis, Clustered Wound: Yes Neuropathy Photos Wound Measurements Length: (cm) Width: (cm) Depth: (cm) Clustered Quantity: Area: (cm) Volume: (cm) 4 % Reduction in Area: -90.5% 1 % Reduction in Volume: 76.2% 0.1 Epithelialization: Small (1-33%) 3 Tunneling: No 3.142 Undermining: No 0.314 Wound Description Classification: Full Thickness With Exposed Support Structures Wound Margin: Fibrotic scar, thickened scar Exudate Amount: Medium Exudate Type: Serosanguineous Exudate Color: red, brown Foul Odor After Cleansing: No Slough/Fibrino Yes Wound Bed Granulation Amount: Large (67-100%) Exposed Structure Granulation Quality: Red, Pink Fascia Exposed: No Necrotic Amount: Small (1-33%) Fat  Layer (Subcutaneous Tissue) Exposed: Yes Necrotic Quality: Adherent Slough Tendon Exposed: No Muscle Exposed: No Joint Exposed: No Bone Exposed: No Periwound Skin Texture Texture Color No Abnormalities Noted: No No Abnormalities Noted: No Scarring: Yes Hemosiderin Staining: Yes Moisture Temperature / Pain No Abnormalities Noted: No Temperature: No Abnormality Dry / Scaly: Yes Treatment Notes Wound #22 (Lower Leg) Wound Laterality: Left, Lateral, Proximal Cleanser Soap and Water Discharge Instruction: May shower and wash wound with dial antibacterial soap and water prior to dressing change. Wound Cleanser Discharge Instruction: Cleanse the wound with wound cleanser prior to applying a clean dressing using gauze sponges, not tissue or cotton balls. Peri-Wound Care Sween Lotion (Moisturizing lotion) Discharge Instruction: Apply moisturizing lotion to the leg Topical Skintegrity Hydrogel 4 (oz) Discharge Instruction: Apply hydrogel as directed Primary Dressing Endoform 2x2 in Discharge Instruction: Moisten with Hydrogel or saline Secondary Dressing NATAN, HARTOG ([id number]) [id number] Page 8 of 8 ABD Pad, 8x10 Discharge Instruction: Apply over primary dressing as directed. Secured With Elastic Bandage 4 inch (ACE bandage) Discharge Instruction: Secure with ACE bandage as directed. Compression Wrap Compression Stockings Add-Ons Electronic Signature(s) Signed: 10/04/2022 3:58:28 PM By: [name] Entered By: [name] on 10/04/2022 10:59:50 -------------------------------------------------------------------------------- Vitals Details Patient Name: Date of Service: [name] Grapes. 10/04/2022 10:45 A M Medical Record Number: [id number] Patient Account Number: [id number] Date of Birth/Sex: Treating RN: [birth year] (72 y.o. M) Primary Care Ladainian Therien: [name] Other Clinician: Referring Emilo Gras: Treating Lashonne Shull/Extender:  [name] in Treatment: 16 Vital Signs Time Taken: 10:38 Temperature (F): 97.8 Height (in): 74 Pulse (bpm): 68 Weight (lbs): 238 Respiratory Rate (breaths/min): 20 Body Mass Index (BMI): 30.6 Blood Pressure (mmHg): 152/82 Capillary Blood Glucose (mg/dl): 952 Reference Range: 80 - 120 mg / dl Electronic Signature(s) Signed: 10/04/2022 11:49:34 AM By: Dayton Scrape Entered By: Dayton Scrape on 10/04/2022 10:38:23

## 2022-10-11 NOTE — Progress Notes (Signed)
KALLON, CAYLOR ([id number]) [id number] Page 1 of 10 Visit Report for 09/07/2022 Arrival Information Details Patient Name: Date of Service: REMIJIO, HOLLERAN 09/07/2022 10:00 A M Medical Record Number: [id number] Patient Account Number: [id number] Date of Birth/Sex: Treating RN: Oct 26, 1950 (72 y.o. [name] Primary Care Rozlynn Lippold: [name] Other Clinician: Referring Ardeth Repetto: Treating Dewey Viens/Extender: Lyndal Pulley in Treatment: 56 Visit Information History Since Last Visit All ordered tests and consults were completed: Yes Patient Arrived: Ambulatory Added or deleted any medications: No Arrival Time: 10:02 Any new allergies or adverse reactions: No Accompanied By: self Had a fall or experienced change in No Transfer Assistance: None activities of daily living that may affect Patient Identification Verified: Yes risk of falls: Secondary Verification Process Completed: Yes Signs or symptoms of abuse/neglect since last visito No Patient Requires Transmission-Based Precautions: No Hospitalized since last visit: No Patient Has Alerts: Yes Implantable device outside of the clinic excluding No cellular tissue based products placed in the center since last visit: Has Dressing in Place as Prescribed: Yes Pain Present Now: No Electronic Signature(s) Signed: 10/11/2022 1:55:51 PM By: Brenton Grills Entered By: Brenton Grills on 09/07/2022 10:03:38 -------------------------------------------------------------------------------- Compression Therapy Details Patient Name: Date of Service: [name] 09/07/2022 10:00 A M Medical Record Number: [id number] Patient Account Number: [id number] Date of Birth/Sex: Treating RN: 20-Feb-1951 (72 y.o. [name] Primary Care Endrit Gittins: [name] Other Clinician: Referring Kanyia Heaslip: Treating Winter Jocelyn/Extender: Lyndal Pulley in Treatment: 08 Compression  Therapy Performed for Wound Assessment: Wound #18R Left,Plantar Metatarsal head first Performed By: Clinician Brenton Grills, RN Compression Type: Double Layer Post Procedure Diagnosis Same as Pre-procedure Electronic Signature(s) Signed: 10/11/2022 1:55:51 PM By: Brenton Grills Entered By: Brenton Grills on 09/07/2022 10:39:57 -------------------------------------------------------------------------------- Encounter Discharge Information Details Patient Name: Date of Service: [name] 09/07/2022 10:00 A M Medical Record Number: [id number] Patient Account Number: [id number] Date of Birth/Sex: Treating RN: Aug 15, 1950 (72 y.o. [name] Primary Care Lemarcus Baggerly: [name] Other Clinician: Referring [place] Oborn: Treating Tenya Araque/Extender: Lyndal Pulley in Treatment: 58 Encounter Discharge Information Items Post Procedure Vitals Discharge Condition: Stable Temperature (F): 98.3 Ambulatory Status: Cane Pulse (bpm): 67 Discharge Destination: Home Respiratory Rate (breaths/min): 18 Transportation: Private Auto Blood Pressure (mmHg): 136/82 GERLAD, PELZEL ([id number]) [id number] Page 2 of 10 Accompanied By: self Schedule Follow-up Appointment: Yes Clinical Summary of Care: Patient Declined Electronic Signature(s) Signed: 10/11/2022 1:55:51 PM By: Brenton Grills Entered By: Brenton Grills on 09/07/2022 11:11:44 -------------------------------------------------------------------------------- Lower Extremity Assessment Details Patient Name: Date of Service: VERYL, WINEMILLER 09/07/2022 10:00 A M Medical Record Number: [id number] Patient Account Number: [id number] Date of Birth/Sex: Treating RN: 03/08/1951 (72 y.o. [name] Primary Care Nyshawn Gowdy: [name] Other Clinician: Referring Akeila Lana: Treating Shye [name]/Extender: Lyndal Pulley in Treatment: 73 Edema Assessment Assessed: [name]: No]  [name]: No] Edema: [Left: Ye] [Right: s] Calf Left: Right: Point of Measurement: 41 cm From Medial Instep 44.8 cm Ankle Left: Right: Point of Measurement: 10 cm From Medial Instep 29 cm Vascular Assessment Pulses: Dorsalis Pedis Palpable: [Left:Yes] Electronic Signature(s) Signed: 10/11/2022 1:55:51 PM By: Brenton Grills Entered By: Brenton Grills on 09/07/2022 10:12:26 -------------------------------------------------------------------------------- Multi Wound Chart Details Patient Name: Date of Service: [name] 09/07/2022 10:00 A M Medical Record Number: [id number] Patient Account Number: [id number] Date of Birth/Sex: Treating RN: 03/23/1951 (72 y.o. 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 Primary Etiology: Extremity Glaucoma, Sleep Apnea, Glaucoma, Sleep Apnea, Glaucoma, Sleep Apnea, Comorbid History: Hypertension, Peripheral ArterialHypertension, Peripheral Arterial Hypertension, Peripheral Arterial Disease, Peripheral Venous Disease, Disease, Peripheral Venous Disease, Disease, Peripheral Venous Disease, Type II Diabetes, Gout, Osteoarthritis, Type II Diabetes, Gout, Osteoarthritis, Type II Diabetes, Gout, Osteoarthritis, Neuropathy Neuropathy Neuropathy 08/23/2020 06/03/2021 06/21/2022 Date Acquired: 73 65 11 Weeks of Treatment: Open Open Open Wound  Status: Yes No No Wound Recurrence: No Yes No Clustered Wound: N/A 3 N/A Clustered Quantity: 1.6x2.8x0.1 2.5x1x0.1 0x0x0 Measurements L x W x D (cm) 3.519 1.963 0 A (cm) : rea 0.352 0.196 0 Volume (cm) : 75.10% -19.00% 100.00% % Reduction in A rea: 87.50% 85.10% 100.00% % Reduction in Volume: Grade 2 Full Thickness With Exposed Support Full Thickness Without Exposed Classification: Structures Support Structures Medium Medium None Present Exudate A mount: Serosanguineous Serosanguineous N/A Exudate Type: red, brown red, brown N/A Exudate Color: Flat and Intact Fibrotic scar, thickened scar Distinct, outline attached Wound Margin: Large (67-100%) Large (67-100%) None Present (0%) Granulation A mount: Red Red, Pink N/A Granulation Quality: Small (1-33%) Small (1-33%) None Present (0%) Necrotic A mount: Fat Layer (Subcutaneous Tissue): Yes Fat Layer (Subcutaneous Tissue): Yes Fascia: No Exposed Structures: Fascia: No Fascia: No Fat Layer (Subcutaneous Tissue): No Tendon: No Tendon: No Tendon: No Muscle: No Muscle: No Muscle: No Joint: No Joint: No Joint: No Bone: No Bone: No Bone: No Small (1-33%) Small (1-33%) Large (67-100%) Epithelialization: Debridement - Excisional N/A N/A Debridement: Pre-procedure Verification/Time Out 10:35 N/A N/A Taken: Lidocaine 4% Topical Solution N/A N/A Pain Control: Callus, Subcutaneous, Slough N/A N/A Tissue Debrided: Skin/Subcutaneous Tissue N/A N/A Level: 4.48 N/A N/A Debridement A (sq cm): rea Curette N/A N/A Instrument: Minimum N/A N/A Bleeding: Pressure N/A N/A Hemostasis A chieved: Procedure was tolerated well N/A N/A Debridement Treatment Response: 1.6x2.8x0.1 N/A N/A Post Debridement Measurements L x W x D (cm) 0.352 N/A N/A Post Debridement Volume: (cm) Callus: Yes Scarring: Yes No Abnormalities Noted Periwound Skin Texture: Maceration: No Dry/Scaly: Yes Maceration: Yes Periwound Skin  Moisture: Dry/Scaly: No No Abnormalities Noted Hemosiderin Staining: Yes Hemosiderin Staining: Yes Periwound Skin Color: No Abnormality No Abnormality No Abnormality Temperature: Compression Therapy N/A N/A Procedures Performed: Debridement Treatment Notes Wound #18R (Metatarsal head first) Wound Laterality: Plantar, Left Cleanser Soap and Water Discharge Instruction: May shower and wash wound with dial antibacterial soap and water prior to dressing change. Wound Cleanser Discharge Instruction: Cleanse the wound with wound cleanser prior to applying a clean dressing using gauze sponges, not tissue or cotton balls. Peri-Wound Care Topical Primary Dressing Sorbalgon AG Dressing 2x2 (in/in) Discharge Instruction: Apply to wound bed as instructed Secondary Dressing Optifoam Non-Adhesive Dressing, 4x4 in Discharge Instruction: Apply over primary dressing as directed. Zetuvit Plus 4x8 in [place] ([id number]) [id number] Page 4 of 10 Discharge Instruction: Apply over primary dressing as directed. Secured With Compression Wrap CoFlex [employer], 4 x 6 (in/yd) Discharge Instruction: Apply Coflex [employer] as directed. Compression Stockings Add-Ons Wound #22 (Lower Leg) Wound Laterality: Left, Lateral, Proximal Cleanser Soap and Water Discharge Instruction: May shower and wash wound with dial antibacterial soap and water prior  to dressing change. Wound Cleanser Discharge Instruction: Cleanse the wound with wound cleanser prior to applying a clean dressing using gauze sponges, not tissue or cotton balls. Peri-Wound Care Sween Lotion (Moisturizing lotion) Discharge Instruction: Apply moisturizing lotion to the leg Topical Skintegrity Hydrogel 4 (oz) Discharge Instruction: Apply hydrogel as directed Primary Dressing Endoform 2x2 in Discharge Instruction: Moisten with Hydrogel or saline Secondary Dressing ABD Pad, 8x10 Discharge  Instruction: Apply over primary dressing as directed. Secured With Elastic Bandage 4 inch (ACE bandage) Discharge Instruction: Secure with ACE bandage as directed. Compression Wrap Compression Stockings Add-Ons Wound #30 (Lower Leg) Wound Laterality: Left, Anterior Cleanser Peri-Wound Care Topical Primary Dressing Secondary Dressing Secured With Compression Wrap Compression Stockings Add-Ons Electronic Signature(s) Signed: 09/07/2022 12:33:12 PM By: Geralyn Corwin DO Entered By: Geralyn Corwin on 09/07/2022 12:25:23 -------------------------------------------------------------------------------- Multi-Disciplinary Care Plan Details Patient Name: Date of Service: [name] 09/07/2022 10:00 A Sallye Ober ([id number]) [id number] Page 5 of 10 Medical Record Number: [id number] Patient Account Number: [id number] Date of Birth/Sex: Treating RN: 11-12-1950 (72 y.o. [name] Primary Care Vere Diantonio: [name] Other Clinician: Referring Carleen Rhue: Treating Press Casale/Extender: Lyndal Pulley in Treatment: 68 Multidisciplinary Care Plan reviewed with physician Active Inactive Venous Leg Ulcer Nursing Diagnoses: Knowledge deficit related to disease process and management Potential for venous Insuffiency (use before diagnosis confirmed) Goals: Patient will maintain optimal edema control Date Initiated: 07/27/2021 Target Resolution Date: 02/23/2023 Goal Status: Active Interventions: Assess peripheral edema status every visit. Treatment Activities: Therapeutic compression applied : 07/27/2021 Notes: Wound/Skin Impairment Nursing Diagnoses: Impaired tissue integrity Knowledge deficit related to ulceration/compromised skin integrity Goals: Patient will have a decrease in wound volume by X% from date: (specify in notes) Date Initiated: 04/12/2021 Date Inactivated: 01/04/2022 Target Resolution Date: 04/23/2021 Goal  Status: Met Patient/caregiver will verbalize understanding of skin care regimen Date Initiated: 01/04/2022 Target Resolution Date: 02/23/2023 Goal Status: Active Ulcer/skin breakdown will have a volume reduction of 30% by week 4 Date Initiated: 04/12/2021 Date Inactivated: 04/27/2021 Target Resolution Date: 04/27/2021 Goal Status: Unmet Unmet Reason: infection Ulcer/skin breakdown will have a volume reduction of 50% by week 8 Date Initiated: 04/27/2021 Date Inactivated: 06/29/2021 Target Resolution Date: 06/24/2021 Goal Status: Met Interventions: Assess patient/caregiver ability to obtain necessary supplies Assess patient/caregiver ability to perform ulcer/skin care regimen upon admission and as needed Assess ulceration(s) every visit Notes: Electronic Signature(s) Signed: 10/11/2022 1:55:51 PM By: Brenton Grills Entered By: Brenton Grills on 09/07/2022 10:29:07 -------------------------------------------------------------------------------- Pain Assessment Details Patient Name: Date of Service: [name] 09/07/2022 10:00 A M Medical Record Number: [id number] Patient Account Number: [id number] Date of Birth/Sex: Treating RN: [birth year] (72 y.o. [name] Primary Care Reve Crocket: [name] Other Clinician: Referring Geremiah Fussell: Treating Muaad Boehning/Extender: Lyndal Pulley in Treatment: [address] Dola Factor ([id number]) [id number] Page 6 of 10 Location of Pain Severity and Description of Pain Patient Has Paino No Site Locations Rate the pain. Current Pain Level: 0 Pain Management and Medication Current Pain Management: Electronic Signature(s) Signed: 10/11/2022 1:55:51 PM By: Brenton Grills Entered By: Brenton Grills on 09/07/2022 10:04:59 -------------------------------------------------------------------------------- Patient/Caregiver Education Details Patient Name: Date of Service: [place], [name]  3/15/2024andnbsp10:00 A M Medical Record Number: [id number] Patient Account Number: [id number] Date of Birth/Gender: Treating RN: [birth year] (72 y.o. [name] Primary Care Physician: [name] Other Clinician: Referring Physician: Treating Physician/Extender: Lyndal Pulley in Treatment: 67 Education Assessment Education Provided To: Patient Education Topics Provided Wound/Skin Impairment: Methods: Explain/Verbal  Responses: Reinforcements needed, State content correctly Electronic Signature(s) Signed: 10/11/2022 1:55:51 PM By: Brenton Grills Entered By: Brenton Grills on 09/07/2022 10:29:32 -------------------------------------------------------------------------------- Wound Assessment Details Patient Name: Date of Service: [name] 09/07/2022 10:00 A M Medical Record Number: [id number] Patient Account Number: [id number] Date of Birth/Sex: Treating RN: 01/04/1951 (72 y.o. [name] Primary Care Ayala Ribble: [name] Other Clinician: Referring Sreeja Spies: Treating Thena Devora/Extender: Lyndal Pulley in Treatment: 17 Wound Status Wound Number: 18R Primary Diabetic Wound/Ulcer of the Lower Extremity Etiology: Wound Location: Left, Plantar Metatarsal head first Wound Open [name] ([id number]) [id number] Page 7 of 10 Wound Open Wounding Event: Gradually Appeared Status: Date Acquired: 08/23/2020 Comorbid Glaucoma, Sleep Apnea, Hypertension, Peripheral Arterial Disease, Weeks Of Treatment: 73 History: Peripheral Venous Disease, Type II Diabetes, Gout, Osteoarthritis, Clustered Wound: No Neuropathy Photos Wound Measurements Length: (cm) 1.6 Width: (cm) 2.8 Depth: (cm) 0.1 Area: (cm) 3.519 Volume: (cm) 0.352 % Reduction in Area: 75.1% % Reduction in Volume: 87.5% Epithelialization: Small (1-33%) Tunneling: No Undermining: No Wound Description Classification: Grade 2 Wound  Margin: Flat and Intact Exudate Amount: Medium Exudate Type: Serosanguineous Exudate Color: red, brown Foul Odor After Cleansing: No Slough/Fibrino Yes Wound Bed Granulation Amount: Large (67-100%) Exposed Structure Granulation Quality: Red Fascia Exposed: No Necrotic Amount: Small (1-33%) Fat Layer (Subcutaneous Tissue) Exposed: Yes Necrotic Quality: Adherent Slough Tendon Exposed: No Muscle Exposed: No Joint Exposed: No Bone Exposed: No Periwound Skin Texture Texture Color No Abnormalities Noted: No No Abnormalities Noted: Yes Callus: Yes Temperature / Pain Temperature: No Abnormality Moisture No Abnormalities Noted: No Dry / Scaly: No Maceration: No Electronic Signature(s) Signed: 10/11/2022 1:55:51 PM By: Brenton Grills Entered By: Brenton Grills on 09/07/2022 10:43:52 -------------------------------------------------------------------------------- Wound Assessment Details Patient Name: Date of Service: [name] 09/07/2022 10:00 A M Medical Record Number: [id number] Patient Account Number: [id number] Date of Birth/Sex: Treating RN: Nov 06, 1950 (72 y.o. [name] Primary Care Chanley Mcenery: [name] Other Clinician: Referring Lenyx Boody: Treating Wissam Resor/Extender: Lyndal Pulley in Treatment: 21 Wound Status Wound Number: 22 Primary Cyst Etiology: Wound Location: Left, Proximal, Lateral Lower Leg Wound Open Wounding Event: Bump Status: Date Acquired: 06/03/2021 BANKS, CHAIKIN ([id number]) [id number] Page 8 of 10 Date Acquired: 06/03/2021 Comorbid Glaucoma, Sleep Apnea, Hypertension, Peripheral Arterial Disease, Weeks Of Treatment: 65 History: Peripheral Venous Disease, Type II Diabetes, Gout, Osteoarthritis, Clustered Wound: Yes Neuropathy Photos Wound Measurements Length: (cm) Width: (cm) Depth: (cm) Clustered Quantity: Area: (cm) Volume: (cm) 2.5 % Reduction in Area: -19% 1 % Reduction in  Volume: 85.1% 0.1 Epithelialization: Small (1-33%) 3 Tunneling: No 1.963 Undermining: No 0.196 Wound Description Classification: Full Thickness With Exposed Support Structures Wound Margin: Fibrotic scar, thickened scar Exudate Amount: Medium Exudate Type: Serosanguineous Exudate Color: red, brown Foul Odor After Cleansing: No Slough/Fibrino Yes Wound Bed Granulation Amount: Large (67-100%) Exposed Structure Granulation Quality: Red, Pink Fascia Exposed: No Necrotic Amount: Small (1-33%) Fat Layer (Subcutaneous Tissue) Exposed: Yes Necrotic Quality: Adherent Slough Tendon Exposed: No Muscle Exposed: No Joint Exposed: No Bone Exposed: No Periwound Skin Texture Texture Color No Abnormalities Noted: No No Abnormalities Noted: No Scarring: Yes Hemosiderin Staining: Yes Moisture Temperature / Pain No Abnormalities Noted: No Temperature: No Abnormality Dry / Scaly: Yes Electronic Signature(s) Signed: 10/11/2022 1:55:51 PM By: Brenton Grills Entered By: Brenton Grills on 09/07/2022 10:44:37 -------------------------------------------------------------------------------- Wound Assessment Details Patient Name: Date of Service: [name] 09/07/2022 10:00 A M Medical Record Number: [id number] Patient Account Number: [id number] Date of Birth/Sex: Treating  RN: [birth year] (72 y.o. [name] Primary Care Illianna Paschal: [name] Other Clinician: Referring Tabria Steines: Treating Supriya Beaston/Extender: Lyndal Pulley in Treatment: 36 Wound Status Wound Number: 30 Primary Abrasion Etiology: Wound Location: Left, Anterior Lower Leg Wound Open Wounding Event: Shear/Friction Status: Date Acquired: 06/21/2022 Comorbid Glaucoma, Sleep Apnea, Hypertension, Peripheral Arterial Disease, Weeks Of Treatment: 11 History: Peripheral Venous Disease, Type II Diabetes, Gout, Osteoarthritis, Clustered Wound: No [name] ([id number])  [id number] Page 9 of 10 Clustered Wound: No Neuropathy Photos Wound Measurements Length: (cm) Width: (cm) Depth: (cm) Area: (cm) Volume: (cm) 0 % Reduction in Area: 100% 0 % Reduction in Volume: 100% 0 Epithelialization: Large (67-100%) 0 Tunneling: No 0 Undermining: No Wound Description Classification: Full Thickness Without Exposed Support Structures Wound Margin: Distinct, outline attached Exudate Amount: None Present Foul Odor After Cleansing: No Slough/Fibrino No Wound Bed Granulation Amount: None Present (0%) Exposed Structure Necrotic Amount: None Present (0%) Fascia Exposed: No Fat Layer (Subcutaneous Tissue) Exposed: No Tendon Exposed: No Muscle Exposed: No Joint Exposed: No Bone Exposed: No Periwound Skin Texture Texture Color No Abnormalities Noted: Yes No Abnormalities Noted: No Hemosiderin Staining: Yes Moisture No Abnormalities Noted: Yes Temperature / Pain Temperature: No Abnormality Electronic Signature(s) Signed: 10/11/2022 1:55:51 PM By: Brenton Grills Entered By: Brenton Grills on 09/07/2022 10:45:22 -------------------------------------------------------------------------------- Vitals Details Patient Name: Date of Service: [name] 09/07/2022 10:00 A M Medical Record Number: [id number] Patient Account Number: [id number] Date of Birth/Sex: Treating RN: 1950/07/07 (72 y.o. [name] Primary Care Batu Cassin: [name] Other Clinician: Referring Rondey Fallen: Treating Arin Vanosdol/Extender: Lyndal Pulley in Treatment: 17 Vital Signs Time Taken: 10:00 Temperature (F): 98.3 Height (in): 74 Pulse (bpm): 67 Weight (lbs): 238 Respiratory Rate (breaths/min): 18 Body Mass Index (BMI): 30.6 Blood Pressure (mmHg): 136/82 Capillary Blood Glucose (mg/dl): 010 Reference Range: 80 - 120 mg / dl Electronic Signature(s) Signed: 10/11/2022 1:55:51 PM By: Mervyn Gay, Dola Factor ([id number])  [id number] Page 10 of 10 Entered By: Brenton Grills on 09/07/2022 10:04:42

## 2022-10-18 ENCOUNTER — Encounter (HOSPITAL_BASED_OUTPATIENT_CLINIC_OR_DEPARTMENT_OTHER): Payer: Medicare Other | Admitting: General Surgery

## 2022-10-18 DIAGNOSIS — E11621 Type 2 diabetes mellitus with foot ulcer: Secondary | ICD-10-CM | POA: Diagnosis not present

## 2022-10-23 NOTE — Progress Notes (Signed)
[name], [name] ([id number]) [id number] Page 1 of 12 Visit Report for 10/18/2022 Arrival Information Details Patient Name: Date of Service: [name], [name] 10/18/2022 2:45 PM Medical Record Number: [id number] Patient Account Number: [id number] Date of Birth/Sex: Treating RN: [birth year] (72 y.o. M) Primary Care Jodie Cavey: [name] Other Clinician: Referring Sherica Paternostro: Treating Malichi Palardy/Extender: [name] in Treatment: 62 Visit Information History Since Last Visit All ordered tests and consults were completed: No Patient Arrived: Ambulatory Added or deleted any medications: No Arrival Time: 14:40 Any new allergies or adverse reactions: No Accompanied By: self Had a fall or experienced change in No Transfer Assistance: None activities of daily living that may affect Patient Identification Verified: Yes risk of falls: Secondary Verification Process Completed: Yes Signs or symptoms of abuse/neglect since last visito No Patient Requires Transmission-Based Precautions: No Hospitalized since last visit: No Patient Has Alerts: Yes Implantable device outside of the clinic excluding No cellular tissue based products placed in the center since last visit: Pain Present Now: No Electronic Signature(s) Signed: 10/19/2022 9:07:30 AM By: Dayton Scrape Entered By: Dayton Scrape on 10/18/2022 14:41:10 -------------------------------------------------------------------------------- Compression Therapy Details Patient Name: Date of Service: [name], [name] 10/18/2022 2:45 PM Medical Record Number: [id number] Patient Account Number: [id number] Date of Birth/Sex: Treating RN: February 19, 1951 (72 y.o. [name] Primary Care Huda Petrey: [name] Other Clinician: Referring Tommy [name]: Treating Trevor Wilkie/Extender: [name] in Treatment: 64 Compression Therapy Performed for Wound Assessment: Wound #22 Left,Proximal,Lateral  Lower Leg Performed By: Clinician Brenton Grills, RN Compression Type: Double Layer Post Procedure Diagnosis Same as Pre-procedure Electronic Signature(s) Signed: 10/22/2022 4:01:24 PM By: Brenton Grills Entered By: Brenton Grills on 10/18/2022 16:00:04 -------------------------------------------------------------------------------- Compression Therapy Details Patient Name: Date of Service: [name] Grapes 10/18/2022 2:45 PM Medical Record Number: [id number] Patient Account Number: [id number] Date of Birth/Sex: Treating RN: 01-Oct-1950 (72 y.o. [name] Primary Care Neftali Abair: [name] Other Clinician: Referring Eline Geng: Treating Caliyah Sieh/Extender: [name] in Treatment: 62 Compression Therapy Performed for Wound Assessment: Wound #18R Left,Plantar Metatarsal head first Performed By: Leighton Parody, RN Compression Type: Double Layer Post Procedure Diagnosis Same as [name] ([id number]) [id number] Page 2 of 12 Electronic Signature(s) Signed: 10/22/2022 4:01:24 PM By: Brenton Grills Entered By: Brenton Grills on 10/18/2022 16:00:04 -------------------------------------------------------------------------------- Compression Therapy Details Patient Name: Date of Service: [name], [name] 10/18/2022 2:45 PM Medical Record Number: [id number] Patient Account Number: [id number] Date of Birth/Sex: Treating RN: October 04, 1950 (72 y.o. [name] Primary Care Serena Petterson: [name] Other Clinician: Referring Marjorie Deprey: Treating Francy Mcilvaine/Extender: [name] in Treatment: 74 Compression Therapy Performed for Wound Assessment: Wound #32 Left,Medial,Anterior Lower Leg Performed By: Leighton Parody, RN Compression Type: Double Layer Post Procedure Diagnosis Same as Pre-procedure Electronic Signature(s) Signed: 10/22/2022 4:01:24 PM By: Brenton Grills Entered By:  Brenton Grills on 10/18/2022 16:00:04 -------------------------------------------------------------------------------- Encounter Discharge Information Details Patient Name: Date of Service: [name] Grapes. 10/18/2022 2:45 PM Medical Record Number: [id number] Patient Account Number: [id number] Date of Birth/Sex: Treating RN: 05/06/1951 (72 y.o. [name] Primary Care Ayana Imhof: [name] Other Clinician: Referring Adryan Druckenmiller: Treating Marton Malizia/Extender: [name] in Treatment: 28 Encounter Discharge Information Items Post Procedure Vitals Discharge Condition: Stable Temperature (F): 98.1 Ambulatory Status: Ambulatory Pulse (bpm): 80 Discharge Destination: Home Respiratory Rate (breaths/min): 18 Transportation: Private Auto Blood Pressure (mmHg): 138/72 Accompanied By: self Schedule Follow-up Appointment: Yes Clinical Summary of Care: Patient Declined Electronic Signature(s) Signed: 10/22/2022  4:01:24 PM By: Brenton Grills Entered By: Brenton Grills on 10/18/2022 16:02:13 -------------------------------------------------------------------------------- Lower Extremity Assessment Details Patient Name: Date of Service: [name], [name] 10/18/2022 2:45 PM Medical Record Number: [id number] Patient Account Number: [id number] Date of Birth/Sex: Treating RN: 07-Mar-1951 (72 y.o. [name] Primary Care Shakara Tweedy: [name] Other Clinician: Referring Jhovany Weidinger: Treating Jamarri Vuncannon/Extender: [name] in Treatment: 79 Edema Assessment Assessed: [name]: No] [Right: No] Edema: [Left: Ye] [Right: s] Calf Left: Right: Point of Measurement: 41 cm From Medial Instep 44.1 cm NYLES, MITTON ([id number]) [id number] Page 3 of 12 Ankle Left: Right: Point of Measurement: 10 cm From Medial Instep 29 cm Vascular Assessment Pulses: Dorsalis Pedis Palpable: [Left:Yes] Electronic Signature(s) Signed:  10/22/2022 4:01:24 PM By: Brenton Grills Entered By: Brenton Grills on 10/18/2022 15:03:47 -------------------------------------------------------------------------------- Multi Wound Chart Details Patient Name: Date of Service: [name] Grapes. 10/18/2022 2:45 PM Medical Record Number: [id number] Patient Account Number: [id number] Date of Birth/Sex: Treating RN: March 15, 1951 (72 y.o. 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 Primary Etiology: Extremity Glaucoma, Sleep Apnea, Glaucoma, Sleep Apnea, Glaucoma, Sleep Apnea, Comorbid History: Hypertension, Peripheral Arterial Hypertension, Peripheral Arterial Hypertension, Peripheral Arterial Disease, Peripheral Venous Disease, Disease, Peripheral Venous Disease, Disease, Peripheral Venous Disease, Type II Diabetes, Gout, Osteoarthritis, Type II Diabetes, Gout, Osteoarthritis, Type II Diabetes, Gout, Osteoarthritis, Neuropathy Neuropathy Neuropathy 08/23/2020 06/03/2021 10/18/2022 Date Acquired: 79 71 0 Weeks of Treatment: Open Open Open Wound Status: Yes No No Wound Recurrence: No Yes No Clustered Wound: N/A 3 N/A Clustered Quantity: 0.5x1.7x0.1 2.5x1x0.1 0.9x1.5x0.1 Measurements L x W x D (cm) 0.668 1.963 1.06 A (cm) : rea 0.067 0.196 0.106 Volume (cm) : 95.30% -19.00% N/A % Reduction in Area: 97.60% 85.10% N/A % Reduction in Volume: Grade 2 Full Thickness With Exposed  Support Full Thickness Without Exposed Classification: Structures Support Structures Medium Medium Medium Exudate A mount: Serosanguineous Serosanguineous Serosanguineous Exudate Type: red, brown red, brown red, brown Exudate Color: Flat and Intact Fibrotic scar, thickened scar Flat and Intact Wound Margin: Large (67-100%) Large (67-100%) Medium (34-66%) Granulation Amount: Red Red, Pink Red, Pink Granulation Quality: Small (1-33%) Small (1-33%) Medium (34-66%) Necrotic Amount: Adherent Slough Adherent Slough Eschar Necrotic Tissue: Fat Layer (Subcutaneous Tissue): Yes Fat Layer (Subcutaneous Tissue): Yes Fat Layer (Subcutaneous Tissue): Yes Exposed Structures: [name] Octave ([id number]) [id number] Page 4 of 12 Fascia: No Fascia: No Tendon: No Tendon: No Muscle: No Muscle: No Joint: No Joint: No Bone: No Bone: No Medium (34-66%) Small (1-33%) Small (1-33%) Epithelialization: Debridement - Excisional Debridement - Selective/Open Wound N/A Debridement: Pre-procedure Verification/Time Out 15:15 15:15 N/A Taken: Lidocaine 4% Topical Solution Lidocaine 4% Topical Solution N/A Pain Control: Callus, Subcutaneous Necrotic/Eschar, Slough N/A Tissue Debrided: Skin/Subcutaneous Tissue Non-Viable Tissue N/A Level: 0.67 1.96 N/A Debridement A (sq cm): rea Curette Curette N/A Instrument: Minimum Minimum N/A Bleeding: Pressure Pressure N/A Hemostasis A chieved: 0 0 N/A Procedural Pain: 0 0 N/A Post Procedural Pain: Procedure was tolerated well Procedure was tolerated well N/A Debridement Treatment Response: 0.5x1.7x0.1 2.5x1x0.1 N/A Post Debridement Measurements L x W x D (cm) 0.067 0.196 N/A Post Debridement Volume: (  cm) Callus: Yes Scarring: Yes Scarring: Yes Periwound Skin Texture: Dry/Scaly: Yes Dry/Scaly: Yes Dry/Scaly: Yes Periwound Skin Moisture: Maceration: No Maceration: No No Abnormalities Noted Hemosiderin Staining:  Yes Hemosiderin Staining: Yes Periwound Skin Color: No Abnormality No Abnormality N/A Temperature: Compression Therapy Compression Therapy Compression Therapy Procedures Performed: Debridement Debridement Treatment Notes Wound #18R (Metatarsal head first) Wound Laterality: Plantar, Left Cleanser Soap and Water Discharge Instruction: May shower and wash wound with dial antibacterial soap and water prior to dressing change. Wound Cleanser Discharge Instruction: Cleanse the wound with wound cleanser prior to applying a clean dressing using gauze sponges, not tissue or cotton balls. Peri-Wound Care Topical Primary Dressing Sorbalgon AG Dressing 2x2 (in/in) Discharge Instruction: Apply to wound bed as instructed Secondary Dressing Optifoam Non-Adhesive Dressing, 4x4 in Discharge Instruction: Apply over primary dressing as directed. Zetuvit Plus 4x8 in Discharge Instruction: Apply over primary dressing as directed. Secured With Compression Wrap CoFlex Zinc [employer], 4 x 6 (in/yd) Discharge Instruction: Apply Coflex Zinc [employer] as directed. Compression Stockings Add-Ons Wound #22 (Lower Leg) Wound Laterality: Left, Lateral, Proximal Cleanser Soap and Water Discharge Instruction: May shower and wash wound with dial antibacterial soap and water prior to dressing change. Wound Cleanser Discharge Instruction: Cleanse the wound with wound cleanser prior to applying a clean dressing using gauze sponges, not tissue or cotton balls. Peri-Wound Care Sween Lotion (Moisturizing lotion) Discharge Instruction: Apply moisturizing lotion to the leg Topical Skintegrity Hydrogel 4 (oz) [name], [name] ([id number]) [id number] Page 5 of 12 Discharge Instruction: Apply hydrogel as directed Primary Dressing Endoform 2x2 in Discharge Instruction: Moisten with Hydrogel or saline Maxorb Extra CMC/Alginate Dressing, 4x4 (in/in) Discharge Instruction: Apply to wound bed as  instructed Secondary Dressing ABD Pad, 8x10 Discharge Instruction: Apply over primary dressing as directed. Secured With Elastic Bandage 4 inch (ACE bandage) Discharge Instruction: Secure with ACE bandage as directed. Compression Wrap Compression Stockings Add-Ons Wound #32 (Lower Leg) Wound Laterality: Left, Medial, Anterior Cleanser Soap and Water Discharge Instruction: May shower and wash wound with dial antibacterial soap and water prior to dressing change. Peri-Wound Care Topical Primary Dressing Maxorb Extra Ag+ Alginate Dressing, 4x4.75 (in/in) Discharge Instruction: Apply to wound bed as instructed Secondary Dressing Secured With Compression Wrap Compression Stockings Add-Ons Electronic Signature(s) Signed: 10/18/2022 4:18:10 PM By: [name] MD FACS Entered By: [name] on 10/18/2022 16:18:09 -------------------------------------------------------------------------------- Multi-Disciplinary Care Plan Details Patient Name: Date of Service: [name] Grapes. 10/18/2022 2:45 PM Medical Record Number: [id number] Patient Account Number: [id number] Date of Birth/Sex: Treating RN: 1950/07/24 (72 y.o. [name] Primary Care Markeshia Giebel: [name] Other Clinician: Referring Muhammad Vacca: Treating Admire Bunnell/Extender: [name] in Treatment: 84 Multidisciplinary Care Plan reviewed with physician Active Inactive Venous Leg Ulcer Nursing Diagnoses: Knowledge deficit related to disease process and management Potential for venous Insuffiency (use before diagnosis confirmed) GoalsERMINE, [name] ([id number]) [id number] Page 6 of 12 Patient will maintain optimal edema control Date Initiated: 07/27/2021 Target Resolution Date: 02/23/2023 Goal Status: Active Interventions: Assess peripheral edema status every visit. Treatment Activities: Therapeutic compression applied : 07/27/2021 Notes: Wound/Skin  Impairment Nursing Diagnoses: Impaired tissue integrity Knowledge deficit related to ulceration/compromised skin integrity Goals: Patient will have a decrease in wound volume by X% from date: (specify in notes) Date Initiated: 04/12/2021 Date Inactivated: 01/04/2022 Target Resolution Date: 04/23/2021 Goal Status: Met Patient/caregiver will verbalize understanding of skin care regimen Date Initiated: 01/04/2022 Target Resolution Date: 02/23/2023 Goal Status: Active Ulcer/skin breakdown will have a volume reduction of 30% by  week 4 Date Initiated: 04/12/2021 Date Inactivated: 04/27/2021 Target Resolution Date: 04/27/2021 Goal Status: Unmet Unmet Reason: infection Ulcer/skin breakdown will have a volume reduction of 50% by week 8 Date Initiated: 04/27/2021 Date Inactivated: 06/29/2021 Target Resolution Date: 06/24/2021 Goal Status: Met Interventions: Assess patient/caregiver ability to obtain necessary supplies Assess patient/caregiver ability to perform ulcer/skin care regimen upon admission and as needed Assess ulceration(s) every visit Notes: Electronic Signature(s) Signed: 10/22/2022 4:01:24 PM By: Brenton Grills Entered By: Brenton Grills on 10/18/2022 15:08:00 -------------------------------------------------------------------------------- Pain Assessment Details Patient Name: Date of Service: [name] Grapes 10/18/2022 2:45 PM Medical Record Number: [id number] Patient Account Number: [id number] Date of Birth/Sex: Treating RN: 04/15/1951 (72 y.o. M) Primary Care Artis Beggs: [name] Other Clinician: Referring Fransisco Messmer: Treating Sebert Stollings/Extender: [name] in Treatment: 6 Active Problems Location of Pain Severity and Description of Pain Patient Has Paino No Site Locations [name], [name] ([id number]) [id number] Page 7 of 12 Pain Management and Medication Current Pain Management: Electronic Signature(s) Signed:  10/19/2022 9:07:30 AM By: Dayton Scrape Entered By: Dayton Scrape on 10/18/2022 14:41:52 -------------------------------------------------------------------------------- Patient/Caregiver Education Details Patient Name: Date of Service: [name] Grapes 4/25/2024andnbsp2:45 PM Medical Record Number: [id number] Patient Account Number: [id number] Date of Birth/Gender: Treating RN: May 07, 1951 (72 y.o. [name] Primary Care Physician: [name] Other Clinician: Referring Physician: Treating Physician/Extender: [name] in Treatment: 63 Education Assessment Education Provided To: Patient Education Topics Provided Wound/Skin Impairment: Methods: Explain/Verbal Responses: State content correctly Electronic Signature(s) Signed: 10/22/2022 4:01:24 PM By: Brenton Grills Entered By: Brenton Grills on 10/18/2022 15:08:23 -------------------------------------------------------------------------------- Wound Assessment Details Patient Name: Date of Service: [name] Grapes. 10/18/2022 2:45 PM Medical Record Number: [id number] Patient Account Number: [id number] Date of Birth/Sex: Treating RN: 01-[name]-1952 (72 y.o. M) Primary Care Terryn Rosenkranz: [name] Other Clinician: Referring Margarita Croke: Treating Carlean Crowl/Extender: [name] in Treatment: 79 Wound Status Wound Number: 18R Primary Diabetic Wound/Ulcer of the Lower Extremity Etiology: Wound Location: Left, Plantar Metatarsal head first Wound Open Wounding Event: Gradually Appeared Status: Date Acquired: 08/23/2020 Comorbid Glaucoma, Sleep Apnea, Hypertension, Peripheral Arterial Disease, Weeks Of Treatment: 79 History: Peripheral Venous Disease, Type II Diabetes, Gout, Osteoarthritis, Clustered Wound: No Neuropathy [name], [name] ([id number]) [id number] Page 8 of 12 Photos Wound Measurements Length: (cm) 0.5 Width: (cm) 1.7 Depth: (cm) 0.1 Area: (cm)  0.668 Volume: (cm) 0.067 % Reduction in Area: 95.3% % Reduction in Volume: 97.6% Epithelialization: Medium (34-66%) Tunneling: No Undermining: No Wound Description Classification: Grade 2 Wound Margin: Flat and Intact Exudate Amount: Medium Exudate Type: Serosanguineous Exudate Color: red, brown Foul Odor After Cleansing: No Slough/Fibrino Yes Wound Bed Granulation Amount: Large (67-100%) Exposed Structure Granulation Quality: Red Fascia Exposed: No Necrotic Amount: Small (1-33%) Fat Layer (Subcutaneous Tissue) Exposed: Yes Necrotic Quality: Adherent Slough Tendon Exposed: No Muscle Exposed: No Joint Exposed: No Bone Exposed: No Periwound Skin Texture Texture Color No Abnormalities Noted: No No Abnormalities Noted: Yes Callus: Yes Temperature / Pain Temperature: No Abnormality Moisture No Abnormalities Noted: No Dry / Scaly: Yes Maceration: No Treatment Notes Wound #18R (Metatarsal head first) Wound Laterality: Plantar, Left Cleanser Soap and Water Discharge Instruction: May shower and wash wound with dial antibacterial soap and water prior to dressing change. Wound Cleanser Discharge Instruction: Cleanse the wound with wound cleanser prior to applying a clean dressing using gauze sponges, not tissue or cotton balls. Peri-Wound Care Topical Primary Dressing Sorbalgon AG Dressing 2x2 (in/in) Discharge Instruction: Apply to wound bed as instructed Secondary Dressing Optifoam  Non-Adhesive Dressing, 4x4 in Discharge Instruction: Apply over primary dressing as directed. Zetuvit Plus 4x8 in Discharge Instruction: Apply over primary dressing as directed. Secured With [name] Octave ([id number]) [id number] Page 9 of 12 Compression Wrap CoFlex Zinc Unna Boot, 4 x 6 (in/yd) Discharge Instruction: Apply Coflex Zinc [employer] as directed. Compression Stockings Add-Ons Electronic Signature(s) Signed: 10/22/2022 4:01:24 PM By: Brenton Grills Entered By: Brenton Grills on 10/18/2022 15:04:58 -------------------------------------------------------------------------------- Wound Assessment Details Patient Name: Date of Service: [name] Grapes 10/18/2022 2:45 PM Medical Record Number: [id number] Patient Account Number: [id number] Date of Birth/Sex: Treating RN: 06/14/1951 (72 y.o. M) Primary Care Catelyn Friel: [name] Other Clinician: Referring Keyli Duross: Treating Kindsey Eblin/Extender: [name] in Treatment: 79 Wound Status Wound Number: 22 Primary Cyst Etiology: Wound Location: Left, Proximal, Lateral Lower Leg Wound Open Wounding Event: Bump Status: Date Acquired: 06/03/2021 Comorbid Glaucoma, Sleep Apnea, Hypertension, Peripheral Arterial Disease, Weeks Of Treatment: 71 History: Peripheral Venous Disease, Type II Diabetes, Gout, Osteoarthritis, Clustered Wound: Yes Neuropathy Photos Wound Measurements Length: (cm) Width: (cm) Depth: (cm) Clustered Quantity: Area: (cm) Volume: (cm) 2.5 % Reduction in Area: -19% 1 % Reduction in Volume: 85.1% 0.1 Epithelialization: Small (1-33%) 3 Tunneling: No 1.963 Undermining: No 0.196 Wound Description Classification: Full Thickness With Exposed Support Structures Wound Margin: Fibrotic scar, thickened scar Exudate Amount: Medium Exudate Type: Serosanguineous Exudate Color: red, brown Foul Odor After Cleansing: No Slough/Fibrino Yes Wound Bed Granulation Amount: Large (67-100%) Exposed Structure Granulation Quality: Red, Pink Fascia Exposed: No Necrotic Amount: Small (1-33%) Fat Layer (Subcutaneous Tissue) Exposed: Yes Necrotic Quality: Adherent Slough Tendon Exposed: No Muscle Exposed: No Joint Exposed: No Bone Exposed: No Periwound Skin Texture Texture Color No Abnormalities Noted: No No Abnormalities Noted: No [name], [name] ([id number]) [id number] Page 10 of 12 Scarring: Yes Hemosiderin  Staining: Yes Moisture Temperature / Pain No Abnormalities Noted: No Temperature: No Abnormality Dry / Scaly: Yes Treatment Notes Wound #22 (Lower Leg) Wound Laterality: Left, Lateral, Proximal Cleanser Soap and Water Discharge Instruction: May shower and wash wound with dial antibacterial soap and water prior to dressing change. Wound Cleanser Discharge Instruction: Cleanse the wound with wound cleanser prior to applying a clean dressing using gauze sponges, not tissue or cotton balls. Peri-Wound Care Sween Lotion (Moisturizing lotion) Discharge Instruction: Apply moisturizing lotion to the leg Topical Skintegrity Hydrogel 4 (oz) Discharge Instruction: Apply hydrogel as directed Primary Dressing Endoform 2x2 in Discharge Instruction: Moisten with Hydrogel or saline Maxorb Extra CMC/Alginate Dressing, 4x4 (in/in) Discharge Instruction: Apply to wound bed as instructed Secondary Dressing ABD Pad, 8x10 Discharge Instruction: Apply over primary dressing as directed. Secured With Elastic Bandage 4 inch (ACE bandage) Discharge Instruction: Secure with ACE bandage as directed. Compression Wrap Compression Stockings Add-Ons Electronic Signature(s) Signed: 10/22/2022 4:01:24 PM By: Brenton Grills Entered By: Brenton Grills on 10/18/2022 15:05:29 -------------------------------------------------------------------------------- Wound Assessment Details Patient Name: Date of Service: [name] Grapes. 10/18/2022 2:45 PM Medical Record Number: [id number] Patient Account Number: [id number] Date of Birth/Sex: Treating RN: 08-06-1950 (72 y.o. [name] Primary Care Hamilton Marinello: [name] Other Clinician: Referring Dafney Farler: Treating Jourden Gilson/Extender: [name] in Treatment: 79 Wound Status Wound Number: 32 Primary Lesion Etiology: Wound Location: Left, Medial, Anterior Lower Leg Wound Open Wounding Event: Blister Status: Date Acquired:  10/18/2022 Comorbid Glaucoma, Sleep Apnea, Hypertension, Peripheral Arterial Disease, Weeks Of Treatment: 0 History: Peripheral Venous Disease, Type II Diabetes, Gout, Osteoarthritis, Clustered Wound: No Neuropathy Photos [name], [name] ([id number]) [id number] Page 11 of 12  Wound Measurements Length: (cm) 0.9 Width: (cm) 1.5 Depth: (cm) 0.1 Area: (cm) 1.06 Volume: (cm) 0.106 % Reduction in Area: % Reduction in Volume: Epithelialization: Small (1-33%) Tunneling: No Undermining: No Wound Description Classification: Full Thickness Without Exposed Support Structures Wound Margin: Flat and Intact Exudate Amount: Medium Exudate Type: Serosanguineous Exudate Color: red, brown Foul Odor After Cleansing: No Slough/Fibrino No Wound Bed Granulation Amount: Medium (34-66%) Exposed Structure Granulation Quality: Red, Pink Fat Layer (Subcutaneous Tissue) Exposed: Yes Necrotic Amount: Medium (34-66%) Necrotic Quality: Eschar Periwound Skin Texture Texture Color No Abnormalities Noted: No No Abnormalities Noted: No Scarring: Yes Hemosiderin Staining: Yes Moisture No Abnormalities Noted: No Dry / Scaly: Yes Maceration: No Treatment Notes Wound #32 (Lower Leg) Wound Laterality: Left, Medial, Anterior Cleanser Soap and Water Discharge Instruction: May shower and wash wound with dial antibacterial soap and water prior to dressing change. Peri-Wound Care Topical Primary Dressing Maxorb Extra Ag+ Alginate Dressing, 4x4.75 (in/in) Discharge Instruction: Apply to wound bed as instructed Secondary Dressing Secured With Compression Wrap Compression Stockings Add-Ons Electronic Signature(s) Signed: 10/22/2022 4:01:24 PM By: Brenton Grills Entered By: Brenton Grills on 10/18/2022 15:29:02 [name] Octave ([id number]) [id number] Page 12 of 12 -------------------------------------------------------------------------------- Vitals  Details Patient Name: Date of Service: ERWIN, NISHIYAMA 10/18/2022 2:45 PM Medical Record Number: [id number] Patient Account Number: [id number] Date of Birth/Sex: Treating RN: [birth year] (72 y.o. M) Primary Care Alfonzo Arca: [name] Other Clinician: Referring Aritza Brunet: Treating Jupiter Boys/Extender: [name] in Treatment: 79 Vital Signs Time Taken: 02:41 Temperature (F): 98.6 Height (in): 74 Pulse (bpm): 65 Weight (lbs): 238 Respiratory Rate (breaths/min): 20 Body Mass Index (BMI): 30.6 Blood Pressure (mmHg): 149/86 Capillary Blood Glucose (mg/dl): 782 Reference Range: 80 - 120 mg / dl Electronic Signature(s) Signed: 10/19/2022 9:07:30 AM By: Dayton Scrape Entered By: Dayton Scrape on 10/18/2022 14:41:44

## 2022-10-23 NOTE — Progress Notes (Signed)
[name], [name] ([id number]) [id number] Page 1 of 20 Visit Report for 10/18/2022 Chief Complaint Document Details Patient Name: Date of Service: [name] 10/18/2022 2:45 PM Medical Record Number: [id number] Patient Account Number: [id number] Date of Birth/Sex: Treating RN: [birth year] (72 y.o. M) Primary Care Provider: [name] Other Clinician: Referring Provider: Treating Provider/Extender: [name] in Treatment: 79 Information Obtained from: Patient Chief Complaint Left leg and foot ulcers 04/12/2021; patient is here for wounds on his left lower leg and left plantar foot over the first metatarsal head Electronic Signature(s) Signed: 10/18/2022 4:18:21 PM By: [name] MD FACS Entered By: [name] on 10/18/2022 16:18:21 -------------------------------------------------------------------------------- Debridement Details Patient Name: Date of Service: [name]. 10/18/2022 2:45 PM Medical Record Number: [id number] Patient Account Number: [id number] Date of Birth/Sex: Treating RN: 1951-04-19 (72 y.o. [name] Primary Care Provider: [name] Other Clinician: Referring Provider: Treating Provider/Extender: [name] in Treatment: 79 Debridement Performed for Assessment: Wound #22 Left,Proximal,Lateral Lower Leg Performed By: Physician [name], MD Debridement Type: Debridement Level of Consciousness (Pre-procedure): Awake and Alert Pre-procedure Verification/Time Out Yes - 15:15 Taken: Start Time: 15:16 Pain Control: Lidocaine 4% Topical Solution Percent of Wound Bed Debrided: 100% T Area Debrided (cm): otal 1.96 Tissue and other material debrided: Eschar, Slough, Slough Level: Non-Viable Tissue Debridement Description: Selective/Open Wound Instrument: Curette Bleeding: Minimum Hemostasis Achieved: Pressure End Time: 15:17 Procedural Pain: 0 Post  Procedural Pain: 0 Response to Treatment: Procedure was tolerated well Level of Consciousness (Post- Awake and Alert procedure): Post Debridement Measurements of Total Wound Length: (cm) 2.5 Width: (cm) 1 Depth: (cm) 0.1 Volume: (cm) 0.196 Character of Wound/Ulcer Post Debridement: Improved Post Procedure Diagnosis Same as Pre-procedure Notes Scribed for Dr. Lady Gary by Brenton Grills RN. Electronic Signature(s) ARLYN, BUERKLE ([id number]) [id number] Page 2 of 20 Signed: 10/18/2022 4:24:20 PM By: [name] MD FACS Signed: 10/22/2022 4:01:24 PM By: Brenton Grills Entered By: Brenton Grills on 10/18/2022 15:19:09 -------------------------------------------------------------------------------- Debridement Details Patient Name: Date of Service: [name]. 10/18/2022 2:45 PM Medical Record Number: [id number] Patient Account Number: [id number] Date of Birth/Sex: Treating RN: Mar 06, 1951 (72 y.o. [name] Primary Care Provider: [name] Other Clinician: Referring Provider: Treating Provider/Extender: [name] in Treatment: 79 Debridement Performed for Assessment: Wound #18R Left,Plantar Metatarsal head first Performed By: Physician [name], MD Debridement Type: Debridement Severity of Tissue Pre Debridement: Fat layer exposed Level of Consciousness (Pre-procedure): Awake and Alert Pre-procedure Verification/Time Out Yes - 15:15 Taken: Start Time: 15:16 Pain Control: Lidocaine 4% Topical Solution Percent of Wound Bed Debrided: 100% T Area Debrided (cm): otal 0.67 Tissue and other material debrided: Callus, Subcutaneous, Skin: Dermis , Skin: Epidermis Level: Skin/Subcutaneous Tissue Debridement Description: Excisional Instrument: Curette Bleeding: Minimum Hemostasis Achieved: Pressure End Time: 15:17 Procedural Pain: 0 Post Procedural Pain: 0 Response to Treatment: Procedure was tolerated  well Level of Consciousness (Post- Awake and Alert procedure): Post Debridement Measurements of Total Wound Length: (cm) 0.5 Width: (cm) 1.7 Depth: (cm) 0.1 Volume: (cm) 0.067 Character of Wound/Ulcer Post Debridement: Improved Severity of Tissue Post Debridement: Fat layer exposed Post Procedure Diagnosis Same as Pre-procedure Notes Scribed for Dr. Lady Gary by Brenton Grills RN. Electronic Signature(s) Signed: 10/18/2022 4:24:20 PM By: [name] MD FACS Signed: 10/22/2022 4:01:24 PM By: Brenton Grills Entered By: Brenton Grills on 10/18/2022 15:21:17 -------------------------------------------------------------------------------- HPI Details Patient Name: Date of Service: [name]. 10/18/2022 2:45 PM Medical Record Number: [id number] Patient Account  Number: [id number] Date of Birth/Sex: Treating RN: 15-Dec-1950 (72 y.o. M) Primary Care Provider: [name] Other Clinician: Referring Provider: Treating Provider/Extender: [name] in Treatment: 28 History of Present Illness HPI Description: 10/11/17; Mr. [name] is a 72 year old man who tells me that in 2015 he slipped down the latter traumatizing his left leg. He developed a wound in the same spot the area that we are currently looking at. He states this closed over for the most part although he always felt it was somewhat unstable. In DOMINGO, FUSON ([id number]) [id number] Page 3 of 20 2016 he hit the same area with the door of his car had this reopened. He tells me that this is never really closed although sometimes an inflow it remains open on a constant basis. He has not been using any specific dressing to this except for topical antibiotics the nature of which were not really sure. His primary doctor did send him to see Dr. Jacinto Halim of interventional cardiology. He underwent an angiogram on 08/06/17 and he underwent a PTA and directional atherectomy of the lesser distal  SFA and popliteal arteries which resulted in brisk improvement in blood flow. It was noted that he had 2 vessel runoff through the anterior tibial and peroneal. He is also been to see vascular and interventional radiologist. He was not felt to have any significant superficial venous insufficiency. Presumably is not a candidate for any ablation. It was suggested he come here for wound care. The patient is a type II diabetic on insulin. He also has a history of venous insufficiency. ABIs on the left were noncompressible in our clinic 10/21/17; patient we admitted to the clinic last week. 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 We put Iodosorb on him after an aggressive debridement and 3 layer compression. He complained of pain in his ankle and itching with is skin in fact he scratched the area on the medial calf superiorly at the rim of our wraps and he has 2 small open areas in that location today which are new. I changed his primary dressing today to silver collagen. As noted he is already had revascularization and does not have any significant superficial venous insufficiency that would be amenable to ablation 10/28/17; patient admitted to the clinic 2 weeks ago. He has a smaller Wound. Scratch injury from last week revealed. There is large wound over the tibial area. This is smaller. Granulation looks healthy. No need for debridement. 11/04/17; the wound on the left lateral calf looks better. Improved dimensions. Surface of this looks better. We've been maintaining him and Kerlix Coban wraps. He finds this much more comfortable. Silver collagen dressing 11/11/17; left lateral Wound continues to look healthy be making progress. Using a #5 curet I removed removed nonviable skin from the surface of the wound and then necrotic debris from the wound surface. Surface of the wound continues to look healthy. He also has an open area on the  left great toenail bed. We've been using topical antibiotics. 11/19/17; left anterior lateral wound continues to look healthy but it's not closed. He also had a small wound above this on the left leg Initially traumatic wounds in the setting of significant chronic venous insufficiency and stasis dermatitis 11/25/17; left anterior wounds superiorly is closed still a small wound inferiorly. 12/02/17; left anterior tibial area. Arrives today with adherent callus. Post debridement clearly not completely closed. Hydrofera Blue under 3 layer  compression. 12/09/17; left anterior tibia. Circumferential eschar however the wound bed looks stable to improved. We've been using Hydrofera Blue under 3 layer compression 12/17/17; left anterior tibia. Apparently this was felt to be closed however when the wrap was taken off there is a skin tear to reopen wounds in the same area we've been using Hydrofera Blue under 3 layer compression 12/23/17 left anterior tibia. Not close to close this week apparently the [facility] was stuck to this again. Still circumferential eschar requiring debridement. I put a contact layer on this this time under the Hydrofera Blue 12/31/17; left anterior tibia. Wound is better slight amount of hyper-granulation. Using Hydrofera Blue over Adaptic. 01/07/18; left anterior tibia. The wound had some surface eschar however after this was removed he has no open wound.he was already revascularized by Dr. Jacinto Halim when he came to our clinic with atherectomy of the left SFA and popliteal artery. He was also sent to interventional radiology for venous reflux studies. He was not felt to have significant reflux but certainly has chronic venous changes of his skin with hemosiderin deposition around this area. He will definitely need to lubricate his skin and wear compression stocking and I've talked to him about this. READMISSION 05/26/2018 This is a now 72 year old man we cared for with traumatic wounds on  his left anterior lower extremity. He had been previously revascularized during that admission by Dr. Jacinto Halim. Apparently in follow-up Dr. Jacinto Halim noted that he had deterioration in his arterial status. He underwent a stent placement in the distal left SFA on 04/22/2018. 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 He went back to the angiography suite on 04/30/2018 he underwent PTA and balloon angioplasty of the occluded left mid anterior tibial artery, thrombotic occlusion went from 100 to 0% which reconstitutes the posterior tibial artery. He had thrombectomy and aspiration of the peroneal artery. The stent placed in the distal SFA left SFA was still occluded. 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 He has been recuperating at [facility] after the most recent hospitalization. He is going home tomorrow. He tells me that 2 weeks ago he traumatized the tip of his left fifth toe. He came in urgently for our review of this. This was a history of before I noted that Dr. Jacinto Halim had already noted dry gangrenous changes of the left fifth toe 06/09/2018; 2-week follow-up. I did contact Dr. Jacinto Halim after his last appointment and he apparently saw 1 of Dr. Verl Dicker colleagues the next day. He does not follow-up with Dr. Jacinto Halim himself until Thursday of this week. 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 Nevertheless there is no evidence of infection no drainage and no pain. He had a new area that this week when we were signing him in today on the left anterior mid tibia area, this is in close proximity to the previous wound we have dealt with in this clinic. 06/23/2018; 2-week follow-up. I did not receive a recent  note from Dr. Jacinto Halim to review today. Our office is trying to obtain this. He is apparently not planning to do further vascular interventions and wondered about compression to try and help with the patient's chronic venous  insufficiency. However we are also concerned about the arterial flow. He arrives in clinic today with a new area on the left third toe. The areas on the calf/anterior tibia are close to closing. The left fifth toe is still mummified using Betadine. -In reviewing things with the patient he has what sounds like claudication with mild to moderate amount of activity. 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 I prescribed Levaquin over the phone while we attempted to arrange a plan of care. However the patient called yesterday to report he had low-grade fever and he came in today acutely. There is been a marked deterioration in the left third toe with spreading cellulitis up into the dorsal left foot. He was referred to the emergency room. Readmission: 06/29/2020 patient presents today for reevaluation here in our clinic he was previously treated by Dr. Leanord Hawking at the latter part of 2019 in 2 the beginning of 2020. Subsequently we have not seen him since that time in the interim he did have evaluation with vein and vascular specialist specifically Dr. Bo Mcclintock who did perform quite extensive work for a left femoral to anterior tibial artery bypass. With that being said in the interim the patient has developed significant lymphedema and has wounds that he tells me have really never healed in regard to the incision site on the left leg. He also has multiple wounds on the feet for various reasons some of which is that he tends to pick at his feet. Fortunately there is no signs of active infection systemically at this time he does have some wounds that are little bit deeper but most are fairly superficial he seems to have good blood flow and overall  everything appears to be healthy I see no bone exposed and no obvious signs of osteomyelitis. I do not know that he necessarily needs a x-ray at this point although that something we could consider depending on how things progress. The patient does have a history of lymphedema, diabetes, this is type II, chronic kidney disease stage III, hypertension, and history of peripheral vascular disease. 07/05/2020; patient admitted last week. Is a patient I remember from 2019 he had a spreading infection involving the left foot and we sent him to the hospital. 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 He subsequently had a left femoral to anterior tibial bypass by Dr.Cain vein and vascular. He also has severe lymphedema with chronic skin changes related to that on the left leg. The most problematic area that was new today was on the left medial great toe. This was apparently a small area last week there was purulent drainage which our intake nurse cultured. Also areas on the left medial foot and heel left lateral foot. He has 2 areas on the left medial calf left lateral calf in the setting of the severe lymphedema. 07/13/2020 on evaluation today patient appears to be doing better in my opinion compared to his last visit. The good news is there is no signs of active infection systemically and locally I do not see any signs of infection either. He did have an x-ray which was negative that is great news he had a culture which showed MRSA but at the same time he is been on the doxycycline which has helped. I do think we may want to extend this for 7 additional days LEVAN, ALOIA ([id number]) [id number] Page 4 of 20 1/25; patient admitted to  the clinic a few weeks ago. 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 We have been putting him under compression his edema control is a lot better but he is severe  verricused skin on the left leg. He is really done quite well he still has an open area on the left medial calf and the left medial first metatarsal head. We have been using silver collagen on the leg silver alginate on the foot 07/27/2020 upon evaluation today patient appears to be doing decently well in regard to his wounds. He still has a lot of dry skin on the left leg. Some of this is starting to peel back and I think he may be able to have them out by removing some that today. Fortunately there is no signs of active infection at this time on the left leg although on the right leg he does appear to have swelling and erythema as well as some mild warmth to touch. This does have been concerned about the possibility of cellulitis although within the differential diagnosis I do think that potentially a DVT has to be at least considered. We need to rule that out before proceeding would just call in the cellulitis. Especially since he is having pain in the posterior aspect of his calf muscle. 2/8; the patient had seen sparingly. He has severe skin changes of chronic lymphedema in the left leg thickened hyperkeratotic verrucous skin. He has an open wound on the medial part of the left first met head left mid tibia. He also has a rim of nonepithelialized skin in the anterior mid tibia. He brought in the AmLactin lotion that was been prescribed although I am not sure under compression and its utility. There concern about cellulitis on the right lower leg the last time he was here. He was put on on antibiotics. His DVT rule out was negative. The right leg looks fine he is using his stocking on this area 08/10/2020 upon evaluation today patient appears to be doing well with regard to his leg currently. He has been tolerating the dressing changes without complication. Fortunately there is no signs of active infection which is great news. Overall very pleased with where things stand. 2/22; the patient still has  an area on the medial part of the left first met his head. This looks better than when I last saw this earlier this month he has a rim of epithelialization but still some surface debris. Mostly everything on the left leg is healed. There is still a vulnerable in the left mid tibia area. 08/30/2020 upon evaluation today patient appears to be doing much better in regard to his wounds on his foot. 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 Nonetheless I think we will address that today I Minna send in a prescription for him in that regard. Overall though there does not appear to be any signs of significant worsening. 09/07/2020 on evaluation today patient's wounds over his left foot appear to be doing excellent. I do not see any signs of infection there is some callus buildup this can require debridement for certain but overall I feel like he is managing quite nicely. He still using the AmLactin cream which has been beneficial for him as well. 3/22; left foot wound is closed. There is no open area here. He is using  ammonium lactate lotion to the lower extremities to help exfoliate dry cracked skin. He has compression stockings from elastic therapy in [place]. The wound on the medial part of his left first met head is healed today. READMISSION 04/12/2021 Mr. Streetman is a patient we know fairly well he had a prolonged stay in clinic in 2019 with wounds on his left lateral and left anterior lower extremity in the setting of chronic venous insufficiency. More recently he was here earlier this year with predominantly an area on his left foot first metatarsal head plantar and he says the plantar foot broke down on its not long after we discharged him but he did not come back here. The last few months areas of broken down on his left anterior and again the left lateral lower  extremity. The leg itself is very swollen chronically enlarged a lot of hyperkeratotic dry Berry Q skin in the left lower leg. His edema extends well into the thigh. He was seen by Dr. Randie Heinz. He had ABIs on 03/02/2021 showing an ABI on the right of 1 with a TBI of 0.72 his ABI in the left at 1.09 TBI of 0.99. Monophasic and biphasic waveforms on the right. On the left monophasic waveforms were noted he went on to have an angiogram on 03/27/2021 this showed the aortic aortic and iliac segments were free of flow-limiting stenosis the left common femoral vein to evaluate the left femoral to anterior tibial artery bypass was unobstructed the bypass was patent without any areas of stenosis. We discharged the patient in bilateral juxta lite stockings but very clearly that was not sufficient to control the swelling and maintain skin integrity. He is clearly going to need compression pumps. 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 This is fortunate because he is on his feet for long periods of time. 10/27; patient comes in with our intake nurse reporting copious amount of green drainage from the left anterior mid tibia the left dorsal foot and to a lesser extent the left medial mid tibia. We left the compression wrap on all week for the amount of edema in his left leg is quite a bit better. We use silver alginate as the primary dressing 11/3; edema control is good. Left anterior lower leg left medial lower leg and the plantar first metatarsal head. The left anterior lower leg required debridement. Deep tissue culture I did of this wound showed MRSA I put him on 10 days of doxycycline which she will start today. We have him in compression wraps. He has a security card and [employer] however he is retiring on November 15. We will need to then get him into a better offloading boot for the left foot perhaps a total contact cast 11/10; edema control is quite good.  Left anterior and left medial lower leg wounds in the setting of chronic venous insufficiency and lymphedema. He also has a substantial area over the left plantar first metatarsal head. 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 He has significant hypergranulation on the left plantar foot wound. The patient is a diabetic but he does not have significant PAD 11/17; edema control is quite good. Left anterior and left medial lower leg wounds look better. The really concerning area remains the area on the left plantar first metatarsal head. He has a rim  of epithelialization. 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 Starting today with a forefoot off loader but . possibly a total contact cast. 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 Arrives with a very significant odor from I think all of the wounds on the left leg and the left foot. Because of this I did not put a total contact cast on him today but will could still consider this. His wife was having cataract surgery which is the reason he missed the appointment 12/6. I saw this man 5 days ago with a swelling below the popliteal fossa. I thought he actually might have a Baker's cyst however the DVT rule out study that we could arrange right away was negative the technician told me this was not a ruptured Baker's cyst. We attempted to get this aspirated by under ultrasound guidance in interventional radiology however all they did was an ultrasound however it shows an extensive fluid collection 62 x 8 x 9.4 in the left thigh and left calf. The patient states he thinks this started 8 days ago or so but he really is not complaining of any pain,  fever or systemic symptoms. He has not ha 12/20; after some difficulty I managed to get the patient into see Dr. Randie Heinz. Eventually he was taken into the hospital and had a drain put in the fluid collection below his left knee posteriorly extending into the posterior thigh. He still has the drain in place. Culture of this showed moderate staff aureus few Morganella and few Klebsiella he is now on doxycycline and ciprofloxacin as suggested by infectious disease he is on this for a month. The drain will remain in place until it stops draining 12/29; he comes in today with the 1 wound on his left leg and the area on the left plantar first met head significantly smaller. Both look healthy. He still has the drain in the left leg. He says he has to change this daily. Follows up with Dr. Randie Heinz on January 11. 06/29/2021; the wounds that I am following on the left leg and left first met head continued to be quite healthy. However the area where his inferior drain is in place had copious amounts of drainage which was green in color. The wound here is larger. Follows up with Dr. Pascal Lux of vein and vascular his surgeon next week as well as infectious disease. He remains on ciprofloxacin and doxycycline. He is not complaining of excessive pain in either one of the drain areas 1/12; the patient saw vascular surgery and infectious disease. Vascular surgery has left the drain in place as there was still some notable drainage still see him back in 2 weeks. Dr. Dorthula Perfect stop the doxycycline and ciprofloxacin and I do not believe he follows up with them at this point. NICOLAUS, ANDEL ([id number]) [id number] Page 5 of 20 Culture I did last week showed both doxycycline resistant MRSA and Pseudomonas not sensitive to ciprofloxacin although only in rare titers 1/19; the patient's wound on the left anterior lower leg is just about healed. We have continued healing of the area that was medially on the left  leg. Left first plantar metatarsal head continues to get smaller. The major problem here is his  2 drain sites 1 on the left upper calf and lateral thigh. There is purulent drainage still from the left lateral thigh. I gave him antibiotics last week but we still have recultured. He has the drain in the area I think this is eventually going to have to come out. I suspect there will be a connecting wound to heal here perhaps with improved VAc 1/26; the patient had his drain removed by vein and vascular on 1/25/. This was a large pocket of fluid in his left thigh that seem to tunnel into his left upper calf. 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 His mention his Penrose drain was removed today. He now has a tunneling wound on his left calf and left thigh. Both of these probe widely towards each other although I cannot really prove that they connect. Both wounds on his lower leg anteriorly are closed and his area over the first metatarsal head on his right foot continues to improve. We are using Hydrofera Blue here. He also saw infectious disease culture of the abscess they noted was polymicrobial with MRSA, Morganella and Klebsiella he was treated with doxycycline and ciprofloxacin for 4 weeks ending on 07/03/2021. They did not recommend any further antibiotics. Notable that while he still had the Penrose drain in place last week he had purulent drainage coming out of the inferior IandD site this grew [facility], MRSA and Pseudomonas but there does not appear to be any active infection in this area today with the drain out and he is not systemically unwell 2/2; with regards to the drain sites the superior one on the thigh actually is closed down the one on the upper left lateral calf measures about 8 and half centimeters which is an improvement seems to be less prominent although still with a lot of drainage. The only remaining wound is over the first metatarsal head on the left  foot and this looks to be continuing to improve with Hydrofera Blue. 2/9; the area on his plantar left foot continues to contract. Callus around the wound edge. The drain sites specifically have not come down in depth. We put the wound VAC on Monday he changed the canister late last night our intake nurse reported a pocket of fluid perhaps caused by our compression wraps 2/16; continued improvement in left foot plantar wound. drainage site in the calf is not improved in terms of depth (wound vac) 2/23; continued improvement in the left foot wound over the first metatarsal head. With regards to the drain sites the area on his thigh laterally is healed however the open area on his calf is small in terms of circumference by still probes in by about 15 cm. Within using the wound VAC. Hydrofera Blue on his foot 08/24/2021: The left first metatarsal head wound continues to improve. The wound bed is healthy with just some surrounding callus. Unfortunately the open drain site on his calf remains open and tunnels at least 15 cm (the extent of a Q-tip). This is despite several weeks of wound VAC treatment. Based on reading back through the notes, there has been really no significant change in the depth of the wound, although the orifice is smaller and the more cranial wound on his thigh has closed. I suspect the tunnel tracks nearly all the way to this location. 08/31/2021: Continued improvement in the left first metatarsal head wound. There has been absolutely no improvement to the long tunnel from his open drain site on  his calf. We have tried to get him into see vascular surgery sooner to consider the possibility of simply filleting the tract open and allowing it to heal from the bottom up, likely with a wound VAC. They have not yet scheduled a sooner appointment than his current mid April 09/14/2021: He was seen by vascular surgery and they took him to the operating room last week. They opened a portion of the  tunnel, but did not extend the entire length of the known open subcutaneous tract. I read Dr. Darcella Cheshire operative note and it is not clear from that documentation why only a portion of the tract was opened. The heaped up granulation tissue was curetted and removed from at least some portion of the tract. They did place a wound VAC and applied an Unna boot to the leg. The ulcer on his left first metatarsal head is smaller today. The bed looks good and there is just a small amount of surrounding callus. 09/21/2021: The ulcer on his left first metatarsal head looks to be stalled. There is some callus surrounding the wound but the wound bed itself does not appear particularly dynamic. The tunnel tract on his lateral left leg seems to be roughly the same length or perhaps slightly smaller but the wound bed appears healthy with good granulation tissue. He opened up a new wound on his medial thigh and the site of a prior surgical incision. He says that he did this unconsciously in his sleep by scratching. 09/28/2021: Unfortunately, the ulcer on his left first metatarsal head has extended underneath the callus toward the dorsum of his foot. The medial thigh wounds are roughly the same. The tunnel on his lateral left leg continues to be problematic; it is longer than we are able to actually probe with a Q-tip. I am still not certain as to why Dr. Randie Heinz did not open this up entirely when he took the patient to the operating room. We will likely be back in the same situation with just a small superficial opening in a long unhealed tract, as the open portion is granulating in nicely. 10/02/2021: The patient was initially scheduled for a nurse visit, but we are also applying a total contact cast today. The plantar foot wound looks clean without significant accumulated callus. We have been applying Prisma silver collagen to the site. 10/05/2021: The patient is here for his first total contact cast change. We have tried using  gauze packing strips in the tunnel on his lateral leg wound, but this does not seem to be working any better than the white VAC foam. The foot ulcer looks about the same with minimal periwound callus. Medial thigh wound is clean with just some overlying eschar. 10/12/2021: The plantar foot wound is stable without any significant accumulation of periwound callus. The surface is viable with good granulation tissue. The medial thigh wounds are much smaller and are epithelializing. On the other hand, he had purulent drainage coming from the tunnel on his lateral leg. He does go back to see Dr. Randie Heinz next week and is planning to ask him why the wound tunnel was not completely opened at the time of his most recent operation. 10/19/2021: The plantar foot wound is markedly improved and has epithelial tissue coming through the surface. The medial thigh wounds are nearly closed with just a tiny open area. He did see Dr. Randie Heinz earlier this week and apparently they did discuss the possibility of opening the sinus tract further and enabling a wound  VAC application. Apparently there are some limits as to what Dr. 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 I think if we could get the tract open to the level of the popliteal fossa, this would greatly aid in her ability to get this chart closed. That being said, however, today when I probed the tract with a Q-tip, I was not able to insert the entirety of the Q-tip as I have on previous occasions. The tunnel is shorter by about 4 cm. The surface is clean with good granulation tissue and no further episodes of purulent drainage. 10/30/2021: Last week, the patient underwent surgery and had the long tract in his leg opened. There was a rind that was debrided, according to the operative report. His medial thigh ulcers are closed. The plantar foot wound is clean with a good surface and some built up surrounding callus. 11/06/2021: The overall  dimensions of the large wound on his lateral leg remain about the same, but there is good granulation tissue present and the tunneling is a little bit shorter. He has a new wound on his anterior tibial surface, in the same location where he had a similar lesion in the past. The plantar foot wound is clean with some buildup surrounding callus. Just toward the medial aspect of his foot, however, there is an area of darkening that once debrided, revealed another opening in the skin surface. 11/13/2021: The anterior tibial surface wound is closed. The plantar foot wound has some surrounding callus buildup. The area of darkening that I debrided last week and revealed an opening in the skin surface has closed again. The tunnel in the large wound on his lateral leg has come in by about 3 cm. There is healthy granulation tissue on the entire wound surface. 11/23/2021: The patient was out of town last week and did wet-to-dry dressings on his large wound. 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 Unfortunately, he picked open the wound on his left medial thigh. He says that it was itching and he just could not stop scratching it until it was open again. The wound on his plantar foot is smaller and has not accumulated a tremendous amount of callus. The lateral leg wound is shallower and the tunnel has also decreased in depth. There is just a little bit of slough accumulation on the surface. 11/30/2021: Another portion of his left medial thigh has been opened up. All of these wounds are fairly superficial with just a little bit of slough and eschar accumulation. The wound on his plantar foot is almost closed with just a bit of eschar and periwound callus accumulation. The lateral leg wound is nearly flush LULA, MICHAUX ([id number]) [id number] Page 6 of 20 with the surrounding skin and the tunnel is markedly  shallower. 12/07/2021: There is just 1 open area on his left medial thigh. It is clean with just a little bit of perimeter eschar. The wound on his plantar foot continues to contract and just has some eschar and periwound callus accumulation. The lateral leg wound is closing at the more distal aspect and the tunnel is smaller. The surface is nearly flush with the surrounding skin and it has a good bed of granulation tissue. 12/14/2021: The thigh and foot wounds are closed. The lateral leg wound has closed over approximately half of its length. The tunnel continues to contract and the surface is now flush with the  surrounding skin. The wound bed has robust granulation tissue. 12/22/2021: The thigh and foot wounds have reopened. The foot wound has a lot of callus accumulation around and over it. The thigh wound is tiny with just a little bit of slough in the wound bed. The lateral leg wound continues to contract. His vascular surgeon took the wound VAC off earlier in the week and the patient has been doing wet-to-dry dressings. There is a little slough accumulation on the surface. The tunnel is about 3 cm in depth at this point. 12/28/2021: The thigh wound is closed again. The foot wound has some callus that subsequently has peeled back exposing just a small slit of a wound. The lateral leg wound Is down to about half the size that it originally was and the tunnel is down to about half a centimeter in depth. 01/04/2022: The thigh wound remains closed. The foot wound has heavy callus overlying the wound site. Once this was debrided, the wound was found to be closed. The lateral leg wound is smaller again this week and very superficial. No tunnel could be identified. 01/12/2022: The thigh and foot wounds both remain closed. The lateral leg wound is now nearly flush with the skin surface. There is good granulation tissue present with a light layer of slough. 01/19/2022: Due to the way his wrap was placed, the  patient did not change the dressing on his thigh at all and so the foam was saturated and his skin is macerated. There is a light layer of slough on the wound surface. The underlying granulation tissue is robust and healthy-appearing. He has heavy callus buildup at the site of his first metatarsal head wound which is still healed. 02/01/2022: He has been in silver alginate. When he removed the dressing from his thigh wound, however, some leg, superficially reopening a portion of the wound that had healed. In addition, underneath the callus at his left first metatarsal head, there appears to be a blister and the wound appears to be open again. 02/08/2022: The lateral leg wound has contracted substantially. There is eschar and a light layer of slough present. He says that it is starting to pull and is uncomfortable. On inspection, there is some puckering of the scar and the eschar is quite dry; this may account for his symptoms. On his first metatarsal head, the wound is much smaller with just some eschar on the surface. The callus has not reaccumulated. He reports that he had a blister come up on his medial thigh wound at the distal aspect. It popped and there is now an opening in his skin again. Looking back through his library of wound photos, there is what looks like a permanent suture just deep to this location and it may be trying to erode through. We have been using silver alginate on his wounds. 02/15/2022: The lateral leg wound is about half the size it was last week. It is clean with just a little perimeter eschar and light slough. The wound on his first metatarsal head is about the same with heavy callus overlying it. The medial thigh wound is closed again. He does have some skin changes on the top of his foot that looks potentially yeast related. 02/22/2022: The skin on the top of his foot improved with the use of a topical antifungal. The lateral leg wound continues to contract and is again  smaller this week. There is a little bit of slough and eschar on the surface. The first metatarsal head  wound is a little bit smaller but has reaccumulated a thick callus over the top. He decided to try to trim his toenail and ultimately took the entire nail off of his left great toe. 03/02/2022: His lateral leg wound continues to improve, as does the wound on his left great toe. Unfortunately, it appears that somehow his foot got wet and moisture seeped in through the opening causing his skin to lift. There is a large wound now overlying his first metatarsal on both the plantar, medial, and dorsal portion of his foot. There is necrotic tissue and slough present underneath the shaggy macerated skin. 03/08/2022: The lateral leg wound is smaller again today. There is just a light layer of slough and eschar on the surface. The great toe wound is smaller again today. The first metatarsal wound is a little bit smaller today and does not look nearly as necrotic and macerated. There is still slough and nonviable tissue present. 03/15/2022: The lateral leg wound is narrower and just has a little bit of light slough buildup. The first metatarsal wound still has a fair amount of moisture affecting the periwound skin. The great toe wound is healed. 03/22/2022: The lateral leg wound is now isolated to just at the level of his knee. There is some eschar and slough accumulation. The first metatarsal head wound has epithelialized tremendously and is about half the size that it was last week. He still has some maceration on the top of his foot and a fungal odor is present. 03/29/2022: T oday the patient's foot was macerated, suggesting that the cast got wet. The patient has also been picking at his dry skin and has enlarged the wound on his left lateral leg. In the time between having his cast removed and my evaluation, he had picked more dry skin and opened up additional wounds on his Achilles area and dorsal foot. The  plantar first metatarsal head wound, however, is smaller and clean with just macerated callus around the perimeter and light slough on the surface. The lateral leg wound measured a little bit larger but is also fairly clean with eschar and minimal slough. 04/02/2022: The patient had vascular studies done last Friday and so his cast was not applied. He is here today to have that done. 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 04/05/2022: Both wounds are smaller and quite clean. There is just a little biofilm on the lateral leg wound. 10/20; the patient has a wound on the left lateral surgical incision at the level of his lateral knee this looks clean and improved. He is using silver alginate. He also has an area on his left medial foot for which she is using Hydrofera Blue under a total contact cast both wounds are measuring smaller 04/20/2022: The plantar foot wound has contracted considerably and is very close to closing. The lateral leg wound was measured a little larger, but there was a tiny open area that was included in the measurements that was not included last week. He has some eschar around the perimeter but otherwise the wound looks clean. 04/27/2022: The lateral leg wound looks better this week. He says that midweek, he felt it was very dry and began applying hydrogel to the site. I think this was beneficial. The foot wound is nearly closed underneath a thick layer of dry skin and callus. 05/04/2022: The foot wound is healed. He has developed a new small ulcer on his anterior tibial surface about midway up his leg. It  has a little slough on the surface. The lateral leg wound still is fairly dry, but clean with just a little biofilm on the surface. 05/11/2022: The wound on his foot reopened on Wednesday. A large blister formed which then broke open revealing the fat layer underneath. The ulcer on his anterior tibial surface is a little bit larger this week. The lateral leg wound  has much better moisture balance this week. Fortunately, prior to his foot wound reopening, he did get the cast made for his orthotic. 05/15/2022: Already, the left medial foot wound has improved. The tissue is less macerated and the surface is clean. The ulcer on his anterior tibial surface continues to enlarge. This seems likely secondary to accumulated moisture. The lateral leg wound continues to have an improved moisture balance with the use of collagen. 05/25/2022: The medial foot wound continues to contract. It is now substantially smaller with just a little slough on the surface. The anterior tibial surface wound continues to enlarge further. Once again, this seems to be secondary to moisture. The lateral leg wound does not seem to be changing much in size, but the moisture balance is better. MALIKIAH, DEBARR ([id number]) [id number] Page 7 of 20 06/01/2022: The anterior tibial wound is closed. The medial foot wound is down to just a very small, couple of millimeters, opening. The lateral leg wound has good moisture balance, but remains unchanged in size. 12/15; the patient's anterior tibial wound has reopened, however the area on his right first metatarsal head is closed. The major wound is actually on the superior part of his surgical wound in the left lateral thigh. Not a completely viable surface under illumination. This may at some point require a debridement I think he is currently using Prisma. As noted the left medial foot wound has closed 06/14/2022: The anterior tibial wound has closed. The lateral leg wound has a better surface but is basically unchanged in size. The left medial foot wound has reopened. It looks as though there was some callus accumulation and moisture got under the callus which caused the tissue to break down again. 06/21/2022: A new wound has opened up just distal to the previous anterior tibial wound. It is small but has hypertrophic  granulation tissue present. The lateral leg wound is a little bit narrower and has a layer of slough on the surface. The left medial foot wound is down to just a pinhole. His custom orthotics should be available next week. 06/28/2022: The wound on his first metatarsal head has healed. He has developed a new small wound on his medial lower leg, in an old scar site. The lateral leg wound continues to contract but continues to accumulate slough, as well. 07/03/2022: Despite wearing his custom orthopedic shoes, he managed to reopen the wound on his first metatarsal head. He says he thinks his foot got wet and then some skin lifted up and he peeled this away. Both of the lower leg wounds are smaller and have some dry eschar on the surface. The lateral leg wound is quite a bit narrower today. 07/12/2022: The medial lower leg wound is closed. The anterior lower leg wound has contracted considerably. The lateral upper leg wound is narrower with a layer of slough on the surface. The first metatarsal head wound is also smaller, but had copious drainage which saturated the foam border dressing and resulted in some periwound tissue maceration. Fortunately there was no breakdown at this site. 07/19/2022: The lower leg shows signs of significant  maceration; I think he must be sweating excessively inside his cast. There are several areas of skin breakdown present. The wound on his foot is smaller and that on his lateral leg is narrower and is shorter by about a centimeter. 07/26/2022: Last week we used a zinc Coflex wrap prior to applying his total contact cast and this has had the effect of keeping his skin from getting macerated this week. The anterior leg wound has epithelialized substantially. The lateral leg wound is significantly smaller with just a bit of slough on the surface. The first metatarsal head wound is also smaller this week. 08/02/2022: The anterior leg wound was closed on arrival, but while he was sitting  in the room, he picked it open again. The lateral leg wound is smaller with just a little slough on the surface and the first metatarsal head wound has contracted further, as well. 08/09/2022: The first metatarsal head wound is covered with callus. Underneath the callus, it is nearly completely closed. The lateral leg wound is smaller again this week. The anterior leg wound looks better, but he has such heavy buildup of old skin, that moisture is getting underneath this, becoming trapped, and causing the underlying skin to get macerated and open up. 08/16/2022: The first metatarsal head wound is closed. The lateral leg wound continues to contract and is quite a bit smaller again this week. There is just a small, superficial opening remaining on his anterior tibial surface. 08/23/2022: The first metatarsal head wound has, by some miracle, remained closed. The lateral leg wound is substantially smaller with multiple areas of epithelialization. The anterior tibial surface wound is also quite a bit smaller and very clean. 08/30/2022: Unfortunately, his first metatarsal head wound opened up again. It happened in the same fashion as it has on prior occasions. Moisture got under dried skin/callus and created a wound when he removed his sock, taking the skin with it. The anterior tibial surface has a thick shell of hyperkeratotic skin. This has been contributing to ongoing repeat wounding events as moisture gets underneath this and causes tissue breakdown. 3/15; patient presents for follow-up. His anterior left leg wound has healed. He still has the wound to the left lateral aspect and left first met head. We have been using silver alginate and endoform to these areas under [employer]. He has no issues or complaints today. He has been taking Augmentin and reports improvement to his symptoms to the left first met head. 09/13/2022: He has accumulated more thick dry skin in sheets on his lower leg. The lateral leg  wound is about the same size and the left first metatarsal head wound is a little bit smaller. There is slough on both surfaces. There is callus buildup around the foot wound. 09/20/2022: The lateral leg wound is a little bit narrower and the left first metatarsal head wound also seems to have contracted slightly. There is slough on both surfaces. He has a little skin breakdown on his anterior tibial surface. 09/27/2022: The lateral leg wound continues to contract and is quite clean. The first metatarsal head wound is also smaller. There is some perimeter callus and slough accumulation on the foot. The anterior tibial surface is closed. 10/04/2022: Both of his wounds are smaller today, particularly the first metatarsal head wound. 10/18/2022: He missed his appointment last week and ended up cutting off his wrap on Saturday. The anterior tibial wound reopened. It is fairly superficial with a little bit of slough on the surface. His  lateral leg wound is smaller with some slough and eschar buildup. The first metatarsal head wound is also smaller with some callus and slough accumulation. Electronic Signature(s) Signed: 10/18/2022 4:19:47 PM By: [name] MD FACS Entered By: [name] on 10/18/2022 16:19:47 -------------------------------------------------------------------------------- Physical Exam Details Patient Name: Date of Service: [name] 10/18/2022 2:45 PM Medical Record Number: [id number] Patient Account Number: [id number] Date of Birth/Sex: Treating RN: Mar 16, 1951 (72 y.o. M) Primary Care Provider: [name] Other Clinician: Referring Provider: Treating Provider/Extender: [name] in Treatment: [address], [place] ([id number]) [id number] Page 8 of 20 Constitutional Hypertensive, asymptomatic. . . . no acute distress. Respiratory Normal work of breathing on room air. Notes 10/18/2022: The anterior tibial wound  reopened. It is fairly superficial with a little bit of slough on the surface. His lateral leg wound is smaller with some slough and eschar buildup. The first metatarsal head wound is also smaller with some callus and slough accumulation. Electronic Signature(s) Signed: 10/18/2022 4:21:00 PM By: [name] MD FACS Entered By: [name] on 10/18/2022 16:21:00 -------------------------------------------------------------------------------- Physician Orders Details Patient Name: Date of Service: [name]. 10/18/2022 2:45 PM Medical Record Number: [id number] Patient Account Number: [id number] Date of Birth/Sex: Treating RN: 04/15/1951 (72 y.o. [name] Primary Care Provider: [name] Other Clinician: Referring Provider: Treating Provider/Extender: [name] in Treatment: 63 Verbal / Phone Orders: No Diagnosis Coding ICD-10 Coding Code Description L97.528 Non-pressure chronic ulcer of other part of left foot with other specified severity L85.9 Epidermal thickening, unspecified L97.828 Non-pressure chronic ulcer of other part of left lower leg with other specified severity I87.322 Chronic venous hypertension (idiopathic) with inflammation of left lower extremity E11.51 Type 2 diabetes mellitus with diabetic peripheral angiopathy without gangrene I89.0 Lymphedema, not elsewhere classified Follow-up Appointments ppointment in 1 week. - Dr Lady Gary - Room 2 Return A Anesthetic Wound #22 Left,Proximal,Lateral Lower Leg (In clinic) Topical Lidocaine 4% applied to wound bed Bathing/ Shower/ Hygiene May shower with protection but do not get wound dressing(s) wet. Protect dressing(s) with water repellant cover (for example, large plastic bag) or a cast cover and may then take shower. Edema Control - Lymphedema / SCD / Other Avoid standing for long periods of time. Patient to wear own compression stockings every day. - on right  leg; Moisturize legs daily. Compression stocking or Garment 20-30 mm/Hg pressure to: - left leg daily Off-Loading Wound #18R Left,Plantar Metatarsal head first Open toe surgical shoe to: - Front off loader Left ft Other: - minimal weight bearing left foot Wound Treatment Wound #18R - Metatarsal head first Wound Laterality: Plantar, Left Cleanser: Soap and Water 1 x Per Week/30 Days Discharge Instructions: May shower and wash wound with dial antibacterial soap and water prior to dressing change. Cleanser: Wound Cleanser 1 x Per Week/30 Days Discharge Instructions: Cleanse the wound with wound cleanser prior to applying a clean dressing using gauze sponges, not tissue or cotton balls. Prim Dressing: Sorbalgon AG Dressing 2x2 (in/in) 1 x Per Week/30 Days ary Discharge Instructions: Apply to wound bed as instructed Secondary Dressing: Optifoam Non-Adhesive Dressing, 4x4 in 1 x Per Week/30 Days Discharge Instructions: Apply over primary dressing as directed. KEENAN, TREFRY ([id number]) [id number] Page 9 of 20 Secondary Dressing: Zetuvit Plus 4x8 in 1 x Per Week/30 Days Discharge Instructions: Apply over primary dressing as directed. Compression Wrap: CoFlex Zinc Unna Boot, 4 x 6 (in/yd) 1 x Per Week/30 Days Discharge Instructions: Apply Coflex Zinc Roland Rack  Boot as directed. Wound #22 - Lower Leg Wound Laterality: Left, Lateral, Proximal Cleanser: Soap and Water 3 x Per Week/30 Days Discharge Instructions: May shower and wash wound with dial antibacterial soap and water prior to dressing change. Cleanser: Wound Cleanser 3 x Per Week/30 Days Discharge Instructions: Cleanse the wound with wound cleanser prior to applying a clean dressing using gauze sponges, not tissue or cotton balls. Peri-Wound Care: Sween Lotion (Moisturizing lotion) 3 x Per Week/30 Days Discharge Instructions: Apply moisturizing lotion to the leg Topical: Skintegrity Hydrogel 4 (oz) 3 x Per Week/30  Days Discharge Instructions: Apply hydrogel as directed Prim Dressing: Endoform 2x2 in 3 x Per Week/30 Days ary Discharge Instructions: Moisten with Hydrogel or saline Prim Dressing: Maxorb Extra CMC/Alginate Dressing, 4x4 (in/in) 3 x Per Week/30 Days ary Discharge Instructions: Apply to wound bed as instructed Secondary Dressing: ABD Pad, 8x10 (Generic) 3 x Per Week/30 Days Discharge Instructions: Apply over primary dressing as directed. Secured With: Elastic Bandage 4 inch (ACE bandage) 3 x Per Week/30 Days Discharge Instructions: Secure with ACE bandage as directed. Wound #32 - Lower Leg Wound Laterality: Left, Medial, Anterior Cleanser: Soap and Water Discharge Instructions: May shower and wash wound with dial antibacterial soap and water prior to dressing change. Prim Dressing: Maxorb Extra Ag+ Alginate Dressing, 4x4.75 (in/in) ary Discharge Instructions: Apply to wound bed as instructed Electronic Signature(s) Signed: 10/18/2022 4:24:20 PM By: [name] MD FACS Entered By: [name] on 10/18/2022 16:21:24 -------------------------------------------------------------------------------- Problem List Details Patient Name: Date of Service: [name]. 10/18/2022 2:45 PM Medical Record Number: [id number] Patient Account Number: [id number] Date of Birth/Sex: Treating RN: April 13, 1951 (72 y.o. [name] Primary Care Provider: [name] Other Clinician: Referring Provider: Treating Provider/Extender: [name] in Treatment: 60 Active Problems ICD-10 Encounter Code Description Active Date MDM Diagnosis L97.528 Non-pressure chronic ulcer of other part of left foot with other specified 08/26/2022 No Yes severity L85.9 Epidermal thickening, unspecified 08/30/2022 No Yes L97.828 Non-pressure chronic ulcer of other part of left lower leg with other specified 04/12/2021 No Yes severity QUINTUS, PREMO ([id number])  [id number] Page 10 of 20 I87.322 Chronic venous hypertension (idiopathic) with inflammation of left lower 04/12/2021 No Yes extremity E11.51 Type 2 diabetes mellitus with diabetic peripheral angiopathy without gangrene 04/12/2021 No Yes I89.0 Lymphedema, not elsewhere classified 04/12/2021 No Yes Inactive Problems ICD-10 Code Description Active Date Inactive Date E11.621 Type 2 diabetes mellitus with foot ulcer 04/12/2021 04/12/2021 L97.828 Non-pressure chronic ulcer of other part of left lower leg with other specified severity 06/08/2022 06/08/2022 E11.42 Type 2 diabetes mellitus with diabetic polyneuropathy 04/12/2021 04/12/2021 L02.416 Cutaneous abscess of left lower limb 06/13/2021 06/13/2021 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 07/20/2021 07/20/2021 Resolved Problems Electronic Signature(s) Signed: 10/18/2022 4:17:21 PM By: [name] MD FACS Entered By: [name] on 10/18/2022 16:17:21 -------------------------------------------------------------------------------- Progress Note Details Patient Name: Date of Service: [name]. 10/18/2022 2:45 PM Medical Record Number: [id number] Patient Account Number: [id number] Date of Birth/Sex: Treating RN: 09/19/1950 (72 y.o. M) Primary Care Provider: [name] Other Clinician: Referring Provider: Treating Provider/Extender: [name] in Treatment: 76 Subjective Chief Complaint Information obtained from Patient Left leg and foot ulcers 04/12/2021; patient is here for wounds on his left lower leg and left plantar foot over the first metatarsal head History of Present Illness (HPI) 10/11/17; Mr. Frasier is a 72 year old man who tells me that in 2015 he slipped down the latter traumatizing  his left leg. He developed a wound in the same spot the area that we are currently looking at. He states this closed over for the most part  although he always felt it was somewhat unstable. In 2016 he hit the same area with the door of his car had this reopened. He tells me that this is never really closed although sometimes an inflow it remains open on a constant basis. He has not been using any specific dressing to this except for topical antibiotics the nature of which were not really sure. His primary doctor did send him to see Dr. Jacinto Halim of interventional cardiology. He underwent an angiogram on 08/06/17 and he underwent a PTA and directional atherectomy of the lesser distal SFA and popliteal arteries which resulted in brisk improvement in blood flow. It was noted that he had 2 vessel runoff through the anterior tibial and peroneal. He is also been to see vascular and interventional radiologist. He was not felt to have any significant superficial venous insufficiency. Presumably is not a candidate for any ablation. It was suggested he come here for wound care. The patient is a type II diabetic on insulin. He also has a history of venous insufficiency. ABIs on the left were noncompressible in our clinic 10/21/17; patient we admitted to the clinic last week. 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 We put Iodosorb on him after an aggressive debridement and 3 layer compression. He complained of pain in his ankle and itching with is skin in fact he scratched KEDRIC, BUMGARNER ([id number]) [id number] Page 11 of 20 the area on the medial calf superiorly at the rim of our wraps and he has 2 small open areas in that location today which are new. I changed his primary dressing today to silver collagen. As noted he is already had revascularization and does not have any significant superficial venous insufficiency that would be amenable to ablation 10/28/17; patient admitted to the clinic 2 weeks ago. He has a smaller Wound. Scratch injury from last week  revealed. There is large wound over the tibial area. This is smaller. Granulation looks healthy. No need for debridement. 11/04/17; the wound on the left lateral calf looks better. Improved dimensions. Surface of this looks better. We've been maintaining him and Kerlix Coban wraps. He finds this much more comfortable. Silver collagen dressing 11/11/17; left lateral Wound continues to look healthy be making progress. Using a #5 curet I removed removed nonviable skin from the surface of the wound and then necrotic debris from the wound surface. Surface of the wound continues to look healthy. ooHe also has an open area on the left great toenail bed. We've been using topical antibiotics. 11/19/17; left anterior lateral wound continues to look healthy but it's not closed. ooHe also had a small wound above this on the left leg ooInitially traumatic wounds in the setting of significant chronic venous insufficiency and stasis dermatitis 11/25/17; left anterior wounds superiorly is closed still a small wound inferiorly. 12/02/17; left anterior tibial area. Arrives today with adherent callus. Post debridement clearly not completely closed. Hydrofera Blue under 3 layer compression. 12/09/17; left anterior tibia. Circumferential eschar however the wound bed looks stable to improved. We've been using Hydrofera Blue under 3 layer compression 12/17/17; left anterior tibia. Apparently this was felt to be closed however when the wrap was taken off there is a skin tear to reopen wounds in the same area we've  been using Hydrofera Blue under 3 layer compression 12/23/17 left anterior tibia. Not close to close this week apparently the [facility] was stuck to this again. Still circumferential eschar requiring debridement. I put a contact layer on this this time under the Hydrofera Blue 12/31/17; left anterior tibia. Wound is better slight amount of hyper-granulation. Using Hydrofera Blue over Adaptic. 01/07/18; left anterior  tibia. The wound had some surface eschar however after this was removed he has no open wound.he was already revascularized by Dr. Jacinto Halim when he came to our clinic with atherectomy of the left SFA and popliteal artery. He was also sent to interventional radiology for venous reflux studies. He was not felt to have significant reflux but certainly has chronic venous changes of his skin with hemosiderin deposition around this area. He will definitely need to lubricate his skin and wear compression stocking and I've talked to him about this. READMISSION 05/26/2018 This is a now 72 year old man we cared for with traumatic wounds on his left anterior lower extremity. He had been previously revascularized during that admission by Dr. Jacinto Halim. Apparently in follow-up Dr. Jacinto Halim noted that he had deterioration in his arterial status. He underwent a stent placement in the distal left SFA on 04/22/2018. 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 He went back to the angiography suite on 04/30/2018 he underwent PTA and balloon angioplasty of the occluded left mid anterior tibial artery, thrombotic occlusion went from 100 to 0% which reconstitutes the posterior tibial artery. He had thrombectomy and aspiration of the peroneal artery. The stent placed in the distal SFA left SFA was still occluded. 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 He has been recuperating at [facility] in [facility] after the most recent hospitalization. He is going home tomorrow. He tells me that 2 weeks ago he traumatized the tip of his left fifth toe. He came in urgently for our review of this. This was a history of before I noted that Dr. Jacinto Halim had already noted dry  gangrenous changes of the left fifth toe 06/09/2018; 2-week follow-up. I did contact Dr. Jacinto Halim after his last appointment and he apparently saw 1 of Dr. Verl Dicker colleagues the next day. He does not follow-up with Dr. Jacinto Halim himself until Thursday of this week. 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 Nevertheless there is no evidence of infection no drainage and no pain. He had a new area that this week when we were signing him in today on the left anterior mid tibia area, this is in close proximity to the previous wound we have dealt with in this clinic. 06/23/2018; 2-week follow-up. I did not receive a recent note from Dr. Jacinto Halim to review today. Our office is trying to obtain this. He is apparently not planning to do further vascular interventions and wondered about compression to try and help with the patient's chronic venous insufficiency. However we are also concerned about the arterial flow. ooHe arrives in clinic today with a new area on the left third toe. The areas on the calf/anterior tibia are close to closing. The left fifth toe is still mummified using Betadine. -In reviewing things with the patient he has what sounds like claudication  with mild to moderate amount of activity. 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 I prescribed Levaquin over the phone while we attempted to arrange a plan of care. However the patient called yesterday to report he had low-grade fever and he came in today acutely. There is been a marked deterioration in the left third toe with spreading cellulitis up into the dorsal left foot. He was referred to the emergency room. Readmission: 06/29/2020 patient presents today for reevaluation here in our clinic he was previously treated by Dr. Leanord Hawking at the latter part of 2019 in 2 the beginning of 2020. Subsequently we have not seen him since that time in the interim he did have evaluation with vein and vascular  specialist specifically Dr. Bo Mcclintock who did perform quite extensive work for a left femoral to anterior tibial artery bypass. With that being said in the interim the patient has developed significant lymphedema and has wounds that he tells me have really never healed in regard to the incision site on the left leg. He also has multiple wounds on the feet for various reasons some of which is that he tends to pick at his feet. Fortunately there is no signs of active infection systemically at this time he does have some wounds that are little bit deeper but most are fairly superficial he seems to have good blood flow and overall everything appears to be healthy I see no bone exposed and no obvious signs of osteomyelitis. I do not know that he necessarily needs a x-ray at this point although that something we could consider depending on how things progress. The patient does have a history of lymphedema, diabetes, this is type II, chronic kidney disease stage III, hypertension, and history of peripheral vascular disease. 07/05/2020; patient admitted last week. Is a patient I remember from 2019 he had a spreading infection involving the left foot and we sent him to the hospital. 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 He subsequently had a left femoral to anterior tibial bypass by Dr.Cain vein and vascular. He also has severe lymphedema with chronic skin changes related to that on the left leg. The most problematic area that was new today was on the left medial great toe. This was apparently a small area last week there was purulent drainage which our intake nurse cultured. Also areas on the left medial foot and heel left lateral foot. He has 2 areas on the left medial calf left lateral calf in the setting of the severe lymphedema. 07/13/2020 on evaluation today patient appears to be doing better in my opinion compared to his last visit. The good news is there is  no signs of active infection systemically and locally I do not see any signs of infection either. He did have an x-ray which was negative that is great news he had a culture which showed MRSA but at the same time he is been on the doxycycline which has helped. I do think we may want to extend this for 7 additional days 1/25; patient admitted to the clinic a few weeks ago. 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 We have been putting him under compression his edema control is a lot better but he is severe verricused skin on the left leg. He is really done quite well he still has an open area on the left medial calf and the  left medial first metatarsal head. We have been using silver collagen on the leg silver alginate on the foot 07/27/2020 upon evaluation today patient appears to be doing decently well in regard to his wounds. He still has a lot of dry skin on the left leg. Some of this is starting to peel back and I think he may be able to have them out by removing some that today. Fortunately there is no signs of active infection at this time on the left leg although on the right leg he does appear to have swelling and erythema as well as some mild warmth to touch. This does have been concerned about the possibility of cellulitis although within the differential diagnosis I do think that potentially a DVT has to be at least considered. We need to rule that out before proceeding would just call in the cellulitis. Especially since he is having pain in the posterior aspect of his calf muscle. 2/8; the patient had seen sparingly. He has severe skin changes of chronic lymphedema in the left leg thickened hyperkeratotic verrucous skin. He has an open wound on the medial part of the left first met head left mid tibia. He also has a rim of nonepithelialized skin in the anterior mid tibia. He brought in the AmLactin lotion that was been prescribed although I am not  sure under compression and its utility. There concern about cellulitis on the right lower leg the last time he was here. He was put on on antibiotics. His DVT rule out was negative. The right leg looks CAEDEN, FOOTS ([id number]) [id number] Page 12 of 20 fine he is using his stocking on this area 08/10/2020 upon evaluation today patient appears to be doing well with regard to his leg currently. He has been tolerating the dressing changes without complication. Fortunately there is no signs of active infection which is great news. Overall very pleased with where things stand. 2/22; the patient still has an area on the medial part of the left first met his head. This looks better than when I last saw this earlier this month he has a rim of epithelialization but still some surface debris. Mostly everything on the left leg is healed. There is still a vulnerable in the left mid tibia area. 08/30/2020 upon evaluation today patient appears to be doing much better in regard to his wounds on his foot. 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 Nonetheless I think we will address that today I Minna send in a prescription for him in that regard. Overall though there does not appear to be any signs of significant worsening. 09/07/2020 on evaluation today patient's wounds over his left foot appear to be doing excellent. I do not see any signs of infection there is some callus buildup this can require debridement for certain but overall I feel like he is managing quite nicely. He still using the AmLactin cream which has been beneficial for him as well. 3/22; left foot wound is closed. There is no open area here. He is using ammonium lactate lotion to the lower extremities to help exfoliate dry cracked skin. He has compression stockings from [facility]  in [place]. The wound on the medial part of his left first met head is healed today. READMISSION 04/12/2021 Mr. Meloni is a patient we know fairly well he had a prolonged stay in  clinic in 2019 with wounds on his left lateral and left anterior lower extremity in the setting of chronic venous insufficiency. More recently he was here earlier this year with predominantly an area on his left foot first metatarsal head plantar and he says the plantar foot broke down on its not long after we discharged him but he did not come back here. The last few months areas of broken down on his left anterior and again the left lateral lower extremity. The leg itself is very swollen chronically enlarged a lot of hyperkeratotic dry Berry Q skin in the left lower leg. His edema extends well into the thigh. He was seen by Dr. Randie Heinz. He had ABIs on 03/02/2021 showing an ABI on the right of 1 with a TBI of 0.72 his ABI in the left at 1.09 TBI of 0.99. Monophasic and biphasic waveforms on the right. On the left monophasic waveforms were noted he went on to have an angiogram on 03/27/2021 this showed the aortic aortic and iliac segments were free of flow-limiting stenosis the left common femoral vein to evaluate the left femoral to anterior tibial artery bypass was unobstructed the bypass was patent without any areas of stenosis. We discharged the patient in bilateral juxta lite stockings but very clearly that was not sufficient to control the swelling and maintain skin integrity. He is clearly going to need compression pumps. 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 This is fortunate because he is on his feet for long periods of time. 10/27; patient comes in with our intake nurse reporting copious amount of green drainage from the left anterior mid tibia the left dorsal foot and to a lesser extent the left medial mid tibia. We left the compression wrap on all week for the  amount of edema in his left leg is quite a bit better. We use silver alginate as the primary dressing 11/3; edema control is good. Left anterior lower leg left medial lower leg and the plantar first metatarsal head. The left anterior lower leg required debridement. Deep tissue culture I did of this wound showed MRSA I put him on 10 days of doxycycline which she will start today. We have him in compression wraps. He has a security card and [employer] however he is retiring on November 15. We will need to then get him into a better offloading boot for the left foot perhaps a total contact cast 11/10; edema control is quite good. Left anterior and left medial lower leg wounds in the setting of chronic venous insufficiency and lymphedema. He also has a substantial area over the left plantar first metatarsal head. 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 He has significant hypergranulation on the left plantar foot wound. The patient is a diabetic but he does not have significant PAD 11/17; edema control is quite good. Left anterior and left medial lower leg wounds look better. The really concerning area remains the area on the left plantar first metatarsal head. He has a rim of epithelialization. 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 Starting today with a forefoot off loader but . possibly a total contact cast. 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 Arrives with a very significant odor from I think all of the wounds on the left leg and the left foot. Because of this I did not put a total contact cast on him today but will could still consider this. His wife was having cataract surgery  which is the reason he missed the appointment 12/6. I saw this man 5 days ago with a swelling below the popliteal fossa. I thought he actually might have a Baker's cyst however the DVT rule out study that we could arrange right away was negative the technician told me this was not a ruptured Baker's cyst. We attempted to get this aspirated by under ultrasound guidance in interventional radiology however all they did was an ultrasound however it shows an extensive fluid collection 62 x 8 x 9.4 in the left thigh and left calf. The patient states he thinks this started 8 days ago or so but he really is not complaining of any pain, fever or systemic symptoms. He has not ha 12/20; after some difficulty I managed to get the patient into see Dr. Randie Heinz. Eventually he was taken into the hospital and had a drain put in the fluid collection below his left knee posteriorly extending into the posterior thigh. He still has the drain in place. Culture of this showed moderate staff aureus few Morganella and few Klebsiella he is now on doxycycline and ciprofloxacin as suggested by infectious disease he is on this for a month. The drain will remain in place until it stops draining 12/29; he comes in today with the 1 wound on his left leg and the area on the left plantar first met head significantly smaller. Both look healthy. He still has the drain in the left leg. He says he has to change this daily. Follows up with Dr. Randie Heinz on January 11. 06/29/2021; the wounds that I am following on the left leg and left first met head continued to be quite healthy. However the area where his inferior drain is in place had copious amounts of drainage which was green in color. The wound here is larger. Follows up with Dr. Pascal Lux of vein and vascular his surgeon next week as well as infectious disease. He remains on ciprofloxacin and doxycycline. He is not complaining of excessive pain in either one of the drain areas 1/12; the patient saw  vascular surgery and infectious disease. Vascular surgery has left the drain in place as there was still some notable drainage still see him back in 2 weeks. Dr. Dorthula Perfect stop the doxycycline and ciprofloxacin and I do not believe he follows up with them at this point. Culture I did last week showed both doxycycline resistant MRSA and Pseudomonas not sensitive to ciprofloxacin although only in rare titers 1/19; the patient's wound on the left anterior lower leg is just about healed. We have continued healing of the area that was medially on the left leg. Left first plantar metatarsal head continues to get smaller. The major problem here is his 2 drain sites 1 on the left upper calf and lateral thigh. There is purulent drainage still from the left lateral thigh. I gave him antibiotics last week but we still have recultured. He has the drain in the area I think this is eventually going to have to come out. I suspect there will be a connecting wound to heal here perhaps with  improved VAc 1/26; the patient had his drain removed by vein and vascular on 1/25/. This was a large pocket of fluid in his left thigh that seem to tunnel into his left upper calf. 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 His mention his Penrose drain was removed today. He now has a tunneling wound on his left calf and left thigh. Both of these probe widely towards each other although I cannot really prove that they connect. Both wounds on his lower leg anteriorly are closed and his area over the first metatarsal head on his right foot continues to improve. We are using Hydrofera Blue here. DVONTAE, RUAN ([id number]) [id number] Page 13 of 20 He also saw infectious disease culture of the abscess they noted was polymicrobial with MRSA, Morganella and Klebsiella he was treated with doxycycline and ciprofloxacin for 4 weeks ending on 07/03/2021. They did not recommend any further antibiotics.  Notable that while he still had the Penrose drain in place last week he had purulent drainage coming out of the inferior IandD site this grew [facility], MRSA and Pseudomonas but there does not appear to be any active infection in this area today with the drain out and he is not systemically unwell 2/2; with regards to the drain sites the superior one on the thigh actually is closed down the one on the upper left lateral calf measures about 8 and half centimeters which is an improvement seems to be less prominent although still with a lot of drainage. The only remaining wound is over the first metatarsal head on the left foot and this looks to be continuing to improve with Hydrofera Blue. 2/9; the area on his plantar left foot continues to contract. Callus around the wound edge. The drain sites specifically have not come down in depth. We put the wound VAC on Monday he changed the canister late last night our intake nurse reported a pocket of fluid perhaps caused by our compression wraps 2/16; continued improvement in left foot plantar wound. drainage site in the calf is not improved in terms of depth (wound vac) 2/23; continued improvement in the left foot wound over the first metatarsal head. With regards to the drain sites the area on his thigh laterally is healed however the open area on his calf is small in terms of circumference by still probes in by about 15 cm. Within using the wound VAC. Hydrofera Blue on his foot 08/24/2021: The left first metatarsal head wound continues to improve. The wound bed is healthy with just some surrounding callus. Unfortunately the open drain site on his calf remains open and tunnels at least 15 cm (the extent of a Q-tip). This is despite several weeks of wound VAC treatment. Based on reading back through the notes, there has been really no significant change in the depth of the wound, although the orifice is smaller and the more cranial wound on his thigh has  closed. I suspect the tunnel tracks nearly all the way to this location. 08/31/2021: Continued improvement in the left first metatarsal head wound. There has been absolutely no improvement to the long tunnel from his open drain site on his calf. We have tried to get him into see vascular surgery sooner to consider the possibility of simply filleting the tract open and allowing it to heal from the bottom up, likely with a wound VAC. They have not yet scheduled a sooner appointment than his current mid April 09/14/2021: He was seen  by vascular surgery and they took him to the operating room last week. They opened a portion of the tunnel, but did not extend the entire length of the known open subcutaneous tract. I read Dr. Darcella Cheshire operative note and it is not clear from that documentation why only a portion of the tract was opened. The heaped up granulation tissue was curetted and removed from at least some portion of the tract. They did place a wound VAC and applied an Unna boot to the leg. The ulcer on his left first metatarsal head is smaller today. The bed looks good and there is just a small amount of surrounding callus. 09/21/2021: The ulcer on his left first metatarsal head looks to be stalled. There is some callus surrounding the wound but the wound bed itself does not appear particularly dynamic. The tunnel tract on his lateral left leg seems to be roughly the same length or perhaps slightly smaller but the wound bed appears healthy with good granulation tissue. He opened up a new wound on his medial thigh and the site of a prior surgical incision. He says that he did this unconsciously in his sleep by scratching. 09/28/2021: Unfortunately, the ulcer on his left first metatarsal head has extended underneath the callus toward the dorsum of his foot. The medial thigh wounds are roughly the same. The tunnel on his lateral left leg continues to be problematic; it is longer than we are able to actually probe  with a Q-tip. I am still not certain as to why Dr. Randie Heinz did not open this up entirely when he took the patient to the operating room. We will likely be back in the same situation with just a small superficial opening in a long unhealed tract, as the open portion is granulating in nicely. 10/02/2021: The patient was initially scheduled for a nurse visit, but we are also applying a total contact cast today. The plantar foot wound looks clean without significant accumulated callus. We have been applying Prisma silver collagen to the site. 10/05/2021: The patient is here for his first total contact cast change. We have tried using gauze packing strips in the tunnel on his lateral leg wound, but this does not seem to be working any better than the white VAC foam. The foot ulcer looks about the same with minimal periwound callus. Medial thigh wound is clean with just some overlying eschar. 10/12/2021: The plantar foot wound is stable without any significant accumulation of periwound callus. The surface is viable with good granulation tissue. The medial thigh wounds are much smaller and are epithelializing. On the other hand, he had purulent drainage coming from the tunnel on his lateral leg. He does go back to see Dr. Randie Heinz next week and is planning to ask him why the wound tunnel was not completely opened at the time of his most recent operation. 10/19/2021: The plantar foot wound is markedly improved and has epithelial tissue coming through the surface. The medial thigh wounds are nearly closed with just a tiny open area. He did see Dr. Randie Heinz earlier this week and apparently they did discuss the possibility of opening the sinus tract further and enabling a wound VAC application. Apparently there are some limits as to what Dr. 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 I think if we could get the tract open to the level of the popliteal fossa, this would greatly aid in her  ability to get this chart closed. That being said, however,  today when I probed the tract with a Q-tip, I was not able to insert the entirety of the Q-tip as I have on previous occasions. The tunnel is shorter by about 4 cm. The surface is clean with good granulation tissue and no further episodes of purulent drainage. 10/30/2021: Last week, the patient underwent surgery and had the long tract in his leg opened. There was a rind that was debrided, according to the operative report. His medial thigh ulcers are closed. The plantar foot wound is clean with a good surface and some built up surrounding callus. 11/06/2021: The overall dimensions of the large wound on his lateral leg remain about the same, but there is good granulation tissue present and the tunneling is a little bit shorter. He has a new wound on his anterior tibial surface, in the same location where he had a similar lesion in the past. The plantar foot wound is clean with some buildup surrounding callus. Just toward the medial aspect of his foot, however, there is an area of darkening that once debrided, revealed another opening in the skin surface. 11/13/2021: The anterior tibial surface wound is closed. The plantar foot wound has some surrounding callus buildup. The area of darkening that I debrided last week and revealed an opening in the skin surface has closed again. The tunnel in the large wound on his lateral leg has come in by about 3 cm. There is healthy granulation tissue on the entire wound surface. 11/23/2021: The patient was out of town last week and did wet-to-dry dressings on his large wound. 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 Unfortunately, he picked open the wound on his left medial thigh. He says that it was itching and he just could not stop scratching it until it was open again. The wound on his plantar foot is smaller and has not accumulated a  tremendous amount of callus. The lateral leg wound is shallower and the tunnel has also decreased in depth. There is just a little bit of slough accumulation on the surface. 11/30/2021: Another portion of his left medial thigh has been opened up. All of these wounds are fairly superficial with just a little bit of slough and eschar accumulation. The wound on his plantar foot is almost closed with just a bit of eschar and periwound callus accumulation. The lateral leg wound is nearly flush with the surrounding skin and the tunnel is markedly shallower. 12/07/2021: There is just 1 open area on his left medial thigh. It is clean with just a little bit of perimeter eschar. The wound on his plantar foot continues to contract and just has some eschar and periwound callus accumulation. The lateral leg wound is closing at the more distal aspect and the tunnel is smaller. The surface is nearly flush with the surrounding skin and it has a good bed of granulation tissue. 12/14/2021: The thigh and foot wounds are closed. The lateral leg wound has closed over approximately half of its length. The tunnel continues to contract and the surface is now flush with the surrounding skin. The wound bed has robust granulation tissue. 12/22/2021: The thigh and foot wounds have reopened. The foot wound has a lot of callus accumulation around and over it. The thigh wound is tiny with just a little bit of slough in the wound bed. The lateral leg wound continues to contract. His vascular surgeon took the wound VAC off earlier in the  week and the patient has been doing wet-to-dry dressings. There is a little slough accumulation on the surface. The tunnel is about 3 cm in depth at this point. 12/28/2021: The thigh wound is closed again. The foot wound has some callus that subsequently has peeled back exposing just a small slit of a wound. The lateral EMON, LANCE ([id number]) [id number] Page 14 of 20 leg  wound Is down to about half the size that it originally was and the tunnel is down to about half a centimeter in depth. 01/04/2022: The thigh wound remains closed. The foot wound has heavy callus overlying the wound site. Once this was debrided, the wound was found to be closed. The lateral leg wound is smaller again this week and very superficial. No tunnel could be identified. 01/12/2022: The thigh and foot wounds both remain closed. The lateral leg wound is now nearly flush with the skin surface. There is good granulation tissue present with a light layer of slough. 01/19/2022: Due to the way his wrap was placed, the patient did not change the dressing on his thigh at all and so the foam was saturated and his skin is macerated. There is a light layer of slough on the wound surface. The underlying granulation tissue is robust and healthy-appearing. He has heavy callus buildup at the site of his first metatarsal head wound which is still healed. 02/01/2022: He has been in silver alginate. When he removed the dressing from his thigh wound, however, some leg, superficially reopening a portion of the wound that had healed. In addition, underneath the callus at his left first metatarsal head, there appears to be a blister and the wound appears to be open again. 02/08/2022: The lateral leg wound has contracted substantially. There is eschar and a light layer of slough present. He says that it is starting to pull and is uncomfortable. On inspection, there is some puckering of the scar and the eschar is quite dry; this may account for his symptoms. On his first metatarsal head, the wound is much smaller with just some eschar on the surface. The callus has not reaccumulated. He reports that he had a blister come up on his medial thigh wound at the distal aspect. It popped and there is now an opening in his skin again. Looking back through his library of wound photos, there is what looks like a permanent suture  just deep to this location and it may be trying to erode through. We have been using silver alginate on his wounds. 02/15/2022: The lateral leg wound is about half the size it was last week. It is clean with just a little perimeter eschar and light slough. The wound on his first metatarsal head is about the same with heavy callus overlying it. The medial thigh wound is closed again. He does have some skin changes on the top of his foot that looks potentially yeast related. 02/22/2022: The skin on the top of his foot improved with the use of a topical antifungal. The lateral leg wound continues to contract and is again smaller this week. There is a little bit of slough and eschar on the surface. The first metatarsal head wound is a little bit smaller but has reaccumulated a thick callus over the top. He decided to try to trim his toenail and ultimately took the entire nail off of his left great toe. 03/02/2022: His lateral leg wound continues to improve, as does the wound on his left great toe. Unfortunately, it appears  that somehow his foot got wet and moisture seeped in through the opening causing his skin to lift. There is a large wound now overlying his first metatarsal on both the plantar, medial, and dorsal portion of his foot. There is necrotic tissue and slough present underneath the shaggy macerated skin. 03/08/2022: The lateral leg wound is smaller again today. There is just a light layer of slough and eschar on the surface. The great toe wound is smaller again today. The first metatarsal wound is a little bit smaller today and does not look nearly as necrotic and macerated. There is still slough and nonviable tissue present. 03/15/2022: The lateral leg wound is narrower and just has a little bit of light slough buildup. The first metatarsal wound still has a fair amount of moisture affecting the periwound skin. The great toe wound is healed. 03/22/2022: The lateral leg wound is now isolated to just  at the level of his knee. There is some eschar and slough accumulation. The first metatarsal head wound has epithelialized tremendously and is about half the size that it was last week. He still has some maceration on the top of his foot and a fungal odor is present. 03/29/2022: T oday the patient's foot was macerated, suggesting that the cast got wet. The patient has also been picking at his dry skin and has enlarged the wound on his left lateral leg. In the time between having his cast removed and my evaluation, he had picked more dry skin and opened up additional wounds on his Achilles area and dorsal foot. The plantar first metatarsal head wound, however, is smaller and clean with just macerated callus around the perimeter and light slough on the surface. The lateral leg wound measured a little bit larger but is also fairly clean with eschar and minimal slough. 04/02/2022: The patient had vascular studies done last Friday and so his cast was not applied. He is here today to have that done. 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 04/05/2022: Both wounds are smaller and quite clean. There is just a little biofilm on the lateral leg wound. 10/20; the patient has a wound on the left lateral surgical incision at the level of his lateral knee this looks clean and improved. He is using silver alginate. He also has an area on his left medial foot for which she is using Hydrofera Blue under a total contact cast both wounds are measuring smaller 04/20/2022: The plantar foot wound has contracted considerably and is very close to closing. The lateral leg wound was measured a little larger, but there was a tiny open area that was included in the measurements that was not included last week. He has some eschar around the perimeter but otherwise the wound looks clean. 04/27/2022: The lateral leg wound looks better this week. He says that midweek, he felt it was very dry and began applying hydrogel to  the site. I think this was beneficial. The foot wound is nearly closed underneath a thick layer of dry skin and callus. 05/04/2022: The foot wound is healed. He has developed a new small ulcer on his anterior tibial surface about midway up his leg. It has a little slough on the surface. The lateral leg wound still is fairly dry, but clean with just a little biofilm on the surface. 05/11/2022: The wound on his foot reopened on Wednesday. A large blister formed which then broke open revealing the fat layer underneath. The ulcer on his anterior tibial surface  is a little bit larger this week. The lateral leg wound has much better moisture balance this week. Fortunately, prior to his foot wound reopening, he did get the cast made for his orthotic. 05/15/2022: Already, the left medial foot wound has improved. The tissue is less macerated and the surface is clean. The ulcer on his anterior tibial surface continues to enlarge. This seems likely secondary to accumulated moisture. The lateral leg wound continues to have an improved moisture balance with the use of collagen. 05/25/2022: The medial foot wound continues to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 Procedures : CPT4 Code Description Modifier [id number] - WC PHYS LEVEL 3 - EST PT 25 ICD-10 Diagnosis Description 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.828 Non-pressure chronic ulcer of other part of left  lower leg with other specified severity I87.322 Chronic venous hypertension (idiopathic) with inflammation of left lower extremity E11.51 Type 2 diabetes mellitus with diabetic peripheral angiopathy without gangrene Quantity: 1 : [id number] - WC PHYS SUBQ TISS 20 SQ CM ICD-10 Diagnosis Description L97.528 Non-pressure chronic ulcer of other part of left foot with other specified severity Quantity: 1 : [id number] - WC PHYS DEBR WO ANESTH 20 SQ CM ICD-10 Diagnosis Description L97.828 Non-pressure chronic ulcer of other part of left lower leg with other specified severity Quantity: 1 Electronic Signature(s) Signed: 10/18/2022 4:23:31 PM By: [name] MD FACS Entered By: [name] on 10/18/2022 16:23:30

## 2022-10-24 ENCOUNTER — Ambulatory Visit (HOSPITAL_COMMUNITY)
Admission: RE | Admit: 2022-10-24 | Discharge: 2022-10-24 | Disposition: A | Discharge status: Home / Self Care | Payer: Medicare Other | Source: Ambulatory Visit | Attending: Vascular Surgery | Admitting: Vascular Surgery

## 2022-10-24 ENCOUNTER — Ambulatory Visit (INDEPENDENT_AMBULATORY_CARE_PROVIDER_SITE_OTHER): Payer: Medicare Other | Admitting: Physician Assistant

## 2022-10-24 ENCOUNTER — Ambulatory Visit (INDEPENDENT_AMBULATORY_CARE_PROVIDER_SITE_OTHER)
Admission: RE | Admit: 2022-10-24 | Discharge: 2022-10-24 | Disposition: A | Discharge status: Home / Self Care | Payer: Medicare Other | Source: Ambulatory Visit | Attending: Vascular Surgery | Admitting: Vascular Surgery

## 2022-10-24 VITALS — BP 142/86 | HR 65 | Temp 98.6°F | Resp 20 | Ht 73.0 in | Wt 269.8 lb

## 2022-10-24 DIAGNOSIS — I739 Peripheral vascular disease, unspecified: Secondary | ICD-10-CM | POA: Diagnosis present

## 2022-10-24 DIAGNOSIS — I878 Other specified disorders of veins: Secondary | ICD-10-CM

## 2022-10-24 LAB — VAS US ABI WITH/WO TBI
Left ABI: 1.14
Right ABI: 1.34
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
## 2022-10-25 ENCOUNTER — Encounter: Payer: Self-pay | Admitting: "Endocrinology

## 2022-10-25 ENCOUNTER — Encounter (HOSPITAL_BASED_OUTPATIENT_CLINIC_OR_DEPARTMENT_OTHER): Payer: Medicare Other | Attending: General Surgery | Admitting: General Surgery

## 2022-10-25 ENCOUNTER — Encounter: Payer: Self-pay | Admitting: Physician Assistant

## 2022-10-25 DIAGNOSIS — L97528 Non-pressure chronic ulcer of other part of left foot with other specified severity: Secondary | ICD-10-CM | POA: Insufficient documentation

## 2022-10-25 DIAGNOSIS — E11621 Type 2 diabetes mellitus with foot ulcer: Secondary | ICD-10-CM | POA: Insufficient documentation

## 2022-10-25 DIAGNOSIS — I87322 Chronic venous hypertension (idiopathic) with inflammation of left lower extremity: Secondary | ICD-10-CM | POA: Insufficient documentation

## 2022-10-25 DIAGNOSIS — N183 Chronic kidney disease, stage 3 unspecified: Secondary | ICD-10-CM | POA: Insufficient documentation

## 2022-10-25 DIAGNOSIS — E11622 Type 2 diabetes mellitus with other skin ulcer: Secondary | ICD-10-CM | POA: Diagnosis not present

## 2022-10-25 DIAGNOSIS — E1122 Type 2 diabetes mellitus with diabetic chronic kidney disease: Secondary | ICD-10-CM | POA: Diagnosis not present

## 2022-10-25 DIAGNOSIS — L97828 Non-pressure chronic ulcer of other part of left lower leg with other specified severity: Secondary | ICD-10-CM | POA: Insufficient documentation

## 2022-10-25 DIAGNOSIS — I129 Hypertensive chronic kidney disease with stage 1 through stage 4 chronic kidney disease, or unspecified chronic kidney disease: Secondary | ICD-10-CM | POA: Insufficient documentation

## 2022-10-25 DIAGNOSIS — E1151 Type 2 diabetes mellitus with diabetic peripheral angiopathy without gangrene: Secondary | ICD-10-CM | POA: Diagnosis present

## 2022-10-25 DIAGNOSIS — Z833 Family history of diabetes mellitus: Secondary | ICD-10-CM | POA: Insufficient documentation

## 2022-10-25 DIAGNOSIS — Z8249 Family history of ischemic heart disease and other diseases of the circulatory system: Secondary | ICD-10-CM | POA: Insufficient documentation

## 2022-10-25 DIAGNOSIS — Z87891 Personal history of nicotine dependence: Secondary | ICD-10-CM | POA: Insufficient documentation

## 2022-10-25 DIAGNOSIS — I890 Lymphedema, not elsewhere classified: Secondary | ICD-10-CM | POA: Diagnosis not present

## 2022-10-26 NOTE — Progress Notes (Signed)
ABUBAKAR, RENSEL ([id number]) [id number] Page 1 of 11 Visit Report for 10/25/2022 Arrival Information Details Patient Name: Date of Service: [name], [name] 10/25/2022 10:45 A M Medical Record Number: [id number] Patient Account Number: [id number] Date of Birth/Sex: Treating RN: 1950-10-13 (72 y.o. Valma Cava Primary Care Zaneta Lightcap: [name] Other Clinician: Referring Tecia Cinnamon: Treating Arda Keadle/Extender: [name] in Treatment: 71 Visit Information History Since Last Visit Added or deleted any medications: No Patient Arrived: Ambulatory Any new allergies or adverse reactions: No Arrival Time: 10:37 Had a fall or experienced change in No Accompanied By: self activities of daily living that may affect Transfer Assistance: None risk of falls: Patient Identification Verified: Yes Signs or symptoms of abuse/neglect since last visito No Secondary Verification Process Completed: Yes Hospitalized since last visit: No Patient Requires Transmission-Based Precautions: No Implantable device outside of the clinic excluding No Patient Has Alerts: Yes cellular tissue based products placed in the center since last visit: Has Compression in Place as Prescribed: Yes Has Footwear/Offloading in Place as Prescribed: Yes Left: Wedge Shoe Pain Present Now: No Electronic Signature(s) Signed: 10/25/2022 2:04:38 PM By: Tommie Ard RN Entered By: Tommie Ard on 10/25/2022 10:37:57 -------------------------------------------------------------------------------- Compression Therapy Details Patient Name: Date of Service: [name]. 10/25/2022 10:45 A M Medical Record Number: [id number] Patient Account Number: [id number] Date of Birth/Sex: Treating RN: [birth year] (72 y.o. Valma Cava Primary Care Hancel Ion: [name] Other Clinician: Referring Kevron Patella: Treating Nader Boys/Extender: [name] in Treatment:  50 Compression Therapy Performed for Wound Assessment: Wound #18R Left,Plantar Metatarsal head first Performed By: Clinician Tommie Ard, RN Compression Type: Double Layer Post Procedure Diagnosis Same as Pre-procedure Notes Zinc CoFlex Electronic Signature(s) Signed: 10/25/2022 11:27:39 AM By: Tommie Ard RN Entered By: Tommie Ard on 10/25/2022 11:27:39 -------------------------------------------------------------------------------- Compression Therapy Details Patient Name: Date of Service: [name]. 10/25/2022 10:45 A M Medical Record Number: [id number] Patient Account Number: [id number] Date of Birth/Sex: Treating RN: May 11, 1951 (72 y.o. Valma Cava Primary Care Carmin Alvidrez: [name] Other Clinician: Referring Corion Sherrod: Treating Jilliann Subramanian/Extender: [name] in Treatment: 83 Compression Therapy Performed for Wound Assessment: Wound #32 Left,Medial,Anterior Lower Leg Performed By: Clinician Tommie Ard, RN [name] ([id number]) [id number] Page 2 of 11 Compression Type: Double Layer Post Procedure Diagnosis Same as Pre-procedure Notes Zinc CoFlex Electronic Signature(s) Signed: 10/25/2022 11:27:40 AM By: Tommie Ard RN Entered By: Tommie Ard on 10/25/2022 11:27:39 -------------------------------------------------------------------------------- Encounter Discharge Information Details Patient Name: Date of Service: [name]. 10/25/2022 10:45 A M Medical Record Number: [id number] Patient Account Number: [id number] Date of Birth/Sex: Treating RN: 03-Jun-1951 (72 y.o. Valma Cava Primary Care Medardo Hassing: [name] Other Clinician: Referring Senita Corredor: Treating Anandi Abramo/Extender: [name] in Treatment: 74 Encounter Discharge Information Items Post Procedure Vitals Discharge Condition: Stable Temperature (F): 98.6 Ambulatory Status: Ambulatory Pulse (bpm):  72 Discharge Destination: Home Respiratory Rate (breaths/min): 17 Transportation: Private Auto Blood Pressure (mmHg): 123/71 Accompanied By: self Schedule Follow-up Appointment: Yes Clinical Summary of Care: Electronic Signature(s) Signed: 10/25/2022 11:28:52 AM By: Tommie Ard RN Entered By: Tommie Ard on 10/25/2022 11:28:52 -------------------------------------------------------------------------------- Lower Extremity Assessment Details Patient Name: Date of Service: [name]. 10/25/2022 10:45 A M Medical Record Number: [id number] Patient Account Number: [id number] Date of Birth/Sex: Treating RN: 25-Jul-1950 (72 y.o. Valma Cava Primary Care Sonnie Pawloski: [name] Other Clinician: Referring Caitlain Tweed: Treating Quinisha Mould/Extender: [name] in Treatment: 80 Edema Assessment Assessed: [Left: No] [Right:  No] Edema: [Left: Ye] [Right: s] Calf Left: Right: Point of Measurement: 41 cm From Medial Instep 46 cm Ankle Left: Right: Point of Measurement: 10 cm From Medial Instep 29.4 cm Vascular Assessment Pulses: Dorsalis Pedis Palpable: [Left:Yes] Electronic Signature(s) Signed: 10/25/2022 2:04:38 PM By: Tommie Ard RN [name] ([id number]) PM By: Tommie Ard RN [id number] Page 3 of 11 Signed: 10/25/2022 2:04:38 Entered By: Tommie Ard on 10/25/2022 10:48:47 -------------------------------------------------------------------------------- Multi Wound Chart Details Patient Name: Date of Service: [name], [name] 10/25/2022 10:45 A M Medical Record Number: [id number] Patient Account Number: [id number] Date of Birth/Sex: Treating RN: 1951-05-23 (72 y.o. 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 Primary Etiology: Extremity Glaucoma, Sleep Apnea, Glaucoma, Sleep Apnea, Glaucoma, Sleep Apnea, Comorbid History: Hypertension, Peripheral Arterial Hypertension, Peripheral Arterial Hypertension, Peripheral Arterial Disease, Peripheral Venous Disease, Disease, Peripheral Venous Disease, Disease, Peripheral Venous Disease, Type II Diabetes, Gout, Osteoarthritis, Type II Diabetes, Gout, Osteoarthritis, Type II Diabetes, Gout, Osteoarthritis, Neuropathy Neuropathy Neuropathy 08/23/2020 06/03/2021 10/18/2022 Date Acquired: 80 72 1 Weeks of Treatment: Open Open Open Wound Status: Yes No No Wound Recurrence: No Yes No Clustered Wound: N/A 3 N/A Clustered Quantity: 0.5x2.1x0.1 1.6x0.6x0.1 0.5x1.4x0.1 Measurements L x W x D (cm) 0.825 0.754 0.55 A (cm) : rea 0.082 0.075 0.055 Volume (cm) : 94.20% 54.30% 48.10% % Reduction in A rea: 97.10% 94.30% 48.10% % Reduction in Volume: Grade 2 Full Thickness With Exposed Support Full Thickness Without Exposed Classification: Structures Support Structures Medium Medium Medium Exudate A mount: Serosanguineous Serosanguineous Serosanguineous Exudate Type: red, brown red, brown red, brown Exudate Color: Flat and Intact Fibrotic scar, thickened scar Flat and Intact Wound Margin: Large (67-100%) Large (67-100%) Medium (34-66%) Granulation A mount: Red Red, Pink Red, Pink Granulation Quality: Small (1-33%) Small (1-33%) Medium (34-66%) Necrotic A mount: Adherent Slough Adherent Slough Eschar Necrotic Tissue: Fat Layer (Subcutaneous Tissue): Yes Fat Layer (Subcutaneous Tissue): Yes Fat Layer (Subcutaneous Tissue): Yes Exposed  Structures: Fascia: No Fascia: No Tendon: No Tendon: No Muscle: No Muscle: No Joint: No Joint: No Bone: No Bone: No Medium (34-66%) Small (1-33%) Small (1-33%) Epithelialization: Debridement - Excisional Debridement - Selective/Open Wound N/A Debridement: Pre-procedure Verification/Time Out 11:05 11:05 N/A Taken: Lidocaine 4% Topical Solution Lidocaine 4% Topical Solution N/A Pain Control: Callus, Subcutaneous, Slough Necrotic/Eschar, Slough N/A Tissue Debrided: Skin/Subcutaneous Tissue Non-Viable Tissue N/A Level: 0.82 0.75 N/A Debridement A (sq cm): rea Curette Curette N/A Instrument: Minimum Minimum N/A Bleeding: Pressure Pressure N/A Hemostasis A chieved: Procedure was tolerated well Procedure was tolerated well N/A Debridement Treatment Response: [name] ([id number]) [id number] Page 4 of 11 0.5x2.1x0.1 1.6x0.6x0.1 N/A Post Debridement Measurements L x W x D (cm) 0.082 0.075 N/A Post Debridement Volume: (cm) Callus: Yes Scarring: Yes Scarring: Yes Periwound Skin Texture: Dry/Scaly: Yes Dry/Scaly: Yes Dry/Scaly: Yes Periwound Skin Moisture: Maceration: No Maceration: No No Abnormalities Noted Hemosiderin Staining: Yes Hemosiderin Staining: Yes Periwound Skin Color: No Abnormality No Abnormality N/A Temperature: Compression Therapy Debridement Compression Therapy Procedures Performed: Debridement Treatment Notes Wound #18R (Metatarsal head first) Wound Laterality: Plantar, Left Cleanser Soap and Water Discharge Instruction: May shower and wash wound with dial antibacterial  soap and water prior to dressing change. Wound Cleanser Discharge Instruction: Cleanse the wound with wound cleanser prior to applying a clean dressing using gauze sponges, not tissue or cotton balls. Peri-Wound Care Topical Primary Dressing Sorbalgon AG Dressing 2x2 (in/in) Discharge Instruction: Apply to wound bed as instructed Secondary  Dressing Optifoam Non-Adhesive Dressing, 4x4 in Discharge Instruction: Apply over primary dressing as directed. Zetuvit Plus 4x8 in Discharge Instruction: Apply over primary dressing as directed. Secured With Compression Wrap CoFlex [employer], 4 x 6 (in/yd) Discharge Instruction: Apply Coflex [employer]inc [employer] as directed. Compression Stockings Add-Ons Wound #22 (Lower Leg) Wound Laterality: Left, Lateral, Proximal Cleanser Soap and Water Discharge Instruction: May shower and wash wound with dial antibacterial soap and water prior to dressing change. Wound Cleanser Discharge Instruction: Cleanse the wound with wound cleanser prior to applying a clean dressing using gauze sponges, not tissue or cotton balls. Peri-Wound Care Sween Lotion (Moisturizing lotion) Discharge Instruction: Apply moisturizing lotion to the leg Topical Skintegrity Hydrogel 4 (oz) Discharge Instruction: Apply hydrogel as directed Primary Dressing Endoform 2x2 in Discharge Instruction: Moisten with Hydrogel or saline Maxorb Extra CMC/Alginate Dressing, 4x4 (in/in) Discharge Instruction: Apply to wound bed as instructed Secondary Dressing ABD Pad, 8x10 Discharge Instruction: Apply over primary dressing as directed. Secured With Elastic Bandage 4 inch (ACE bandage) Discharge Instruction: Secure with ACE bandage as directed. [name], [name] ([id number]) [id number] Page 5 of 11 Compression Wrap Compression Stockings Add-Ons Wound #32 (Lower Leg) Wound Laterality: Left, Medial, Anterior Cleanser Soap and Water Discharge Instruction: May shower and wash wound with dial antibacterial soap and water prior to dressing change. Peri-Wound Care Topical Primary Dressing Maxorb Extra Ag+ Alginate Dressing, 4x4.75 (in/in) Discharge Instruction: Apply to wound bed as instructed Secondary Dressing Secured With Compression Wrap Compression Stockings Add-Ons Electronic  Signature(s) Signed: 10/25/2022 11:36:43 AM By: [name] MD FACS Entered By: [name] on 10/25/2022 11:36:42 -------------------------------------------------------------------------------- Multi-Disciplinary Care Plan Details Patient Name: Date of Service: [name]. 10/25/2022 10:45 A M Medical Record Number: [id number] Patient Account Number: [id number] Date of Birth/Sex: Treating RN: [birth year] (72 y.o. Valma Cava Primary Care Jorian Willhoite: [name] Other Clinician: Referring Alexas Basulto: Treating Karema Tocci/Extender: [name] in Treatment: 35 Multidisciplinary Care Plan reviewed with physician Active Inactive Venous Leg Ulcer Nursing Diagnoses: Knowledge deficit related to disease process and management Potential for venous Insuffiency (use before diagnosis confirmed) Goals: Patient will maintain optimal edema control Date Initiated: 07/27/2021 Target Resolution Date: 02/23/2023 Goal Status: Active Interventions: Assess peripheral edema status every visit. Treatment Activities: Therapeutic compression applied : 07/27/2021 Notes: Wound/Skin Impairment Nursing Diagnoses: Impaired tissue integrity Knowledge deficit related to ulceration/compromised skin integrity [name], [name] ([id number]) [id number] Page 6 of 11 Goals: Patient will have a decrease in wound volume by X% from date: (specify in notes) Date Initiated: 04/12/2021 Date Inactivated: 01/04/2022 Target Resolution Date: 04/23/2021 Goal Status: Met Patient/caregiver will verbalize understanding of skin care regimen Date Initiated: 01/04/2022 Target Resolution Date: 02/23/2023 Goal Status: Active Ulcer/skin breakdown will have a volume reduction of 30% by week 4 Date Initiated: 04/12/2021 Date Inactivated: 04/27/2021 Target Resolution Date: 04/27/2021 Goal Status: Unmet Unmet Reason: infection Ulcer/skin breakdown will have a volume reduction of  50% by week 8 Date Initiated: 04/27/2021 Date Inactivated: 06/29/2021 Target Resolution Date: 06/24/2021 Goal Status: Met Interventions: Assess patient/caregiver ability to obtain necessary supplies Assess patient/caregiver ability to perform ulcer/skin care regimen upon admission and as needed Assess ulceration(s) every visit Notes: Electronic Signature(s) Signed: 10/25/2022 2:04:38 PM  By: Tommie Ard RN Entered By: Tommie Ard on 10/25/2022 10:55:09 -------------------------------------------------------------------------------- Pain Assessment Details Patient Name: Date of Service: [name], [name] 10/25/2022 10:45 A M Medical Record Number: [id number] Patient Account Number: [id number] Date of Birth/Sex: Treating RN: February 15, 1951 (72 y.o. Valma Cava Primary Care Cainen Burnham: [name] Other Clinician: Referring Orvel Cutsforth: Treating Lakshmi Sundeen/Extender: [name] in Treatment: 59 Active Problems Location of Pain Severity and Description of Pain Patient Has Paino No Site Locations Rate the pain. Current Pain Level: 0 Pain Management and Medication Current Pain Management: Electronic Signature(s) Signed: 10/25/2022 2:04:38 PM By: Tommie Ard RN Entered By: Tommie Ard on 10/25/2022 10:39:05 -------------------------------------------------------------------------------- Patient/Caregiver Education Details Patient Name: Date of Service: [name] 5/2/2024andnbsp10:45 A Sallye Ober ([id number]) [id number] Page 7 of 11 Medical Record Number: [id number] Patient Account Number: [id number] Date of Birth/Gender: Treating RN: 1951/06/23 (72 y.o. Valma Cava Primary Care Physician: [name] Other Clinician: Referring Physician: Treating Physician/Extender: [name] in Treatment: 42 Education Assessment Education Provided To: Patient Education Topics Provided Wound  Debridement: Methods: Explain/Verbal Responses: Reinforcements needed Wound/Skin Impairment: Methods: Explain/Verbal Responses: Reinforcements needed, State content correctly Electronic Signature(s) Signed: 10/25/2022 2:04:38 PM By: Tommie Ard RN Entered By: Tommie Ard on 10/25/2022 10:55:51 -------------------------------------------------------------------------------- Wound Assessment Details Patient Name: Date of Service: [name]. 10/25/2022 10:45 A M Medical Record Number: [id number] Patient Account Number: [id number] Date of Birth/Sex: Treating RN: 1951-03-09 (72 y.o. Valma Cava Primary Care Diesel Lina: [name] Other Clinician: Referring Morley Gaumer: Treating Rhiannon Sassaman/Extender: [name] in Treatment: 80 Wound Status Wound Number: 18R Primary Diabetic Wound/Ulcer of the Lower Extremity Etiology: Wound Location: Left, Plantar Metatarsal head first Wound Open Wounding Event: Gradually Appeared Status: Date Acquired: 08/23/2020 Comorbid Glaucoma, Sleep Apnea, Hypertension, Peripheral Arterial Disease, Weeks Of Treatment: 80 History: Peripheral Venous Disease, Type II Diabetes, Gout, Osteoarthritis, Clustered Wound: No Neuropathy Photos Wound Measurements Length: (cm) 0.5 Width: (cm) 2.1 Depth: (cm) 0.1 Area: (cm) 0.825 Volume: (cm) 0.082 % Reduction in Area: 94.2% % Reduction in Volume: 97.1% Epithelialization: Medium (34-66%) Tunneling: No Undermining: No Wound Description Classification: Grade 2 Wound Margin: Flat and Intact Exudate Amount: Medium Exudate Type: Serosanguineous [name], [name] ([id number]) Exudate Color: red, brown Foul Odor After Cleansing: No Slough/Fibrino Yes [phone] Page 8 of 11 Wound Bed Granulation Amount: Large (67-100%) Exposed Structure Granulation Quality: Red Fascia Exposed: No Necrotic Amount: Small (1-33%) Fat Layer (Subcutaneous Tissue) Exposed:  Yes Necrotic Quality: Adherent Slough Tendon Exposed: No Muscle Exposed: No Joint Exposed: No Bone Exposed: No Periwound Skin Texture Texture Color No Abnormalities Noted: No No Abnormalities Noted: Yes Callus: Yes Temperature / Pain Temperature: No Abnormality Moisture No Abnormalities Noted: No Dry / Scaly: Yes Maceration: No Treatment Notes Wound #18R (Metatarsal head first) Wound Laterality: Plantar, Left Cleanser Soap and Water Discharge Instruction: May shower and wash wound with dial antibacterial soap and water prior to dressing change. Wound Cleanser Discharge Instruction: Cleanse the wound with wound cleanser prior to applying a clean dressing using gauze sponges, not tissue or cotton balls. Peri-Wound Care Topical Primary Dressing Sorbalgon AG Dressing 2x2 (in/in) Discharge Instruction: Apply to wound bed as instructed Secondary Dressing Optifoam Non-Adhesive Dressing, 4x4 in Discharge Instruction: Apply over primary dressing as directed. Zetuvit Plus 4x8 in Discharge Instruction: Apply over primary dressing as directed. Secured With Compression Wrap CoFlex Z[employer], 4 x 6 (in/yd) Discharge Instruction: Apply Coflex [employer]inc [employer] as directed. Compression Stockings Add-Ons Electronic  Signature(s) Signed: 10/25/2022 2:04:38 PM By: Tommie Ard RN Entered By: Tommie Ard on 10/25/2022 10:52:56 -------------------------------------------------------------------------------- Wound Assessment Details Patient Name: Date of Service: [name]. 10/25/2022 10:45 A M Medical Record Number: [id number] Patient Account Number: [id number] Date of Birth/Sex: Treating RN: Oct 09, 1950 (72 y.o. Valma Cava Primary Care Cherita Hebel: [name] Other Clinician: Referring Momo Braun: Treating Eilan Mcinerny/Extender: [name] in Treatment: 80 Wound Status Wound Number: 22 Primary Cyst Etiology: Wound Location: Left, Proximal, Lateral Lower  Leg Wound Open Wounding Event: Bump Status: [name], [name] ([id number]) [id number] Page 9 of 11 Status: Date Acquired: 06/03/2021 Comorbid Glaucoma, Sleep Apnea, Hypertension, Peripheral Arterial Disease, Weeks Of Treatment: 72 History: Peripheral Venous Disease, Type II Diabetes, Gout, Osteoarthritis, Clustered Wound: Yes Neuropathy Photos Wound Measurements Length: (cm) Width: (cm) Depth: (cm) Clustered Quantity: Area: (cm) Volume: (cm) 1.6 % Reduction in Area: 54.3% 0.6 % Reduction in Volume: 94.3% 0.1 Epithelialization: Small (1-33%) 3 Tunneling: No 0.754 Undermining: No 0.075 Wound Description Classification: Full Thickness With Exposed Support Structures Wound Margin: Fibrotic scar, thickened scar Exudate Amount: Medium Exudate Type: Serosanguineous Exudate Color: red, brown Foul Odor After Cleansing: No Slough/Fibrino Yes Wound Bed Granulation Amount: Large (67-100%) Exposed Structure Granulation Quality: Red, Pink Fascia Exposed: No Necrotic Amount: Small (1-33%) Fat Layer (Subcutaneous Tissue) Exposed: Yes Necrotic Quality: Adherent Slough Tendon Exposed: No Muscle Exposed: No Joint Exposed: No Bone Exposed: No Periwound Skin Texture Texture Color No Abnormalities Noted: No No Abnormalities Noted: No Scarring: Yes Hemosiderin Staining: Yes Moisture Temperature / Pain No Abnormalities Noted: No Temperature: No Abnormality Dry / Scaly: Yes Treatment Notes Wound #22 (Lower Leg) Wound Laterality: Left, Lateral, Proximal Cleanser Soap and Water Discharge Instruction: May shower and wash wound with dial antibacterial soap and water prior to dressing change. Wound Cleanser Discharge Instruction: Cleanse the wound with wound cleanser prior to applying a clean dressing using gauze sponges, not tissue or cotton balls. Peri-Wound Care Sween Lotion (Moisturizing lotion) Discharge Instruction: Apply moisturizing lotion to the  leg Topical Skintegrity Hydrogel 4 (oz) Discharge Instruction: Apply hydrogel as directed Primary Dressing Endoform 2x2 in Discharge Instruction: Moisten with Hydrogel or saline Maxorb Extra CMC/Alginate Dressing, 4x4 (in/in) [name], [name] ([id number]) [id number] Page 10 of 11 Discharge Instruction: Apply to wound bed as instructed Secondary Dressing ABD Pad, 8x10 Discharge Instruction: Apply over primary dressing as directed. Secured With Elastic Bandage 4 inch (ACE bandage) Discharge Instruction: Secure with ACE bandage as directed. Compression Wrap Compression Stockings Add-Ons Electronic Signature(s) Signed: 10/25/2022 2:04:38 PM By: Tommie Ard RN Entered By: Tommie Ard on 10/25/2022 10:53:26 -------------------------------------------------------------------------------- Wound Assessment Details Patient Name: Date of Service: [name]. 10/25/2022 10:45 A M Medical Record Number: [id number] Patient Account Number: [id number] Date of Birth/Sex: Treating RN: 10/31/1950 (72 y.o. Valma Cava Primary Care Taurus Willis: [name] Other Clinician: Referring Damarien Nyman: Treating Aleeah Greeno/Extender: [name] in Treatment: 80 Wound Status Wound Number: 32 Primary Lesion Etiology: Wound Location: Left, Medial, Anterior Lower Leg Wound Open Wounding Event: Blister Status: Date Acquired: 10/18/2022 Comorbid Glaucoma, Sleep Apnea, Hypertension, Peripheral Arterial Disease, Weeks Of Treatment: 1 History: Peripheral Venous Disease, Type II Diabetes, Gout, Osteoarthritis, Clustered Wound: No Neuropathy Photos Wound Measurements Length: (cm) 0.5 Width: (cm) 1.4 Depth: (cm) 0.1 Area: (cm) 0.55 Volume: (cm) 0.055 % Reduction in Area: 48.1% % Reduction in Volume: 48.1% Epithelialization: Small (1-33%) Tunneling: No Undermining: No Wound Description Classification: Full Thickness Without Exposed Support  Structures Wound Margin: Flat and Intact Exudate Amount: Medium  Exudate Type: Serosanguineous Exudate Color: red, brown Foul Odor After Cleansing: No Slough/Fibrino No Wound Bed Granulation Amount: Medium (34-66%) Exposed Structure Granulation Quality: Red, Pink Fat Layer (Subcutaneous Tissue) Exposed: Yes Necrotic Amount: Medium (34-66%) Necrotic Quality: COREY, WESEMAN ([id number]) [id number] Page 11 of 11 Periwound Skin Texture Texture Color No Abnormalities Noted: No No Abnormalities Noted: No Scarring: Yes Hemosiderin Staining: Yes Moisture No Abnormalities Noted: No Dry / Scaly: Yes Maceration: No Treatment Notes Wound #32 (Lower Leg) Wound Laterality: Left, Medial, Anterior Cleanser Soap and Water Discharge Instruction: May shower and wash wound with dial antibacterial soap and water prior to dressing change. Peri-Wound Care Topical Primary Dressing Maxorb Extra Ag+ Alginate Dressing, 4x4.75 (in/in) Discharge Instruction: Apply to wound bed as instructed Secondary Dressing Secured With Compression Wrap Compression Stockings Add-Ons Electronic Signature(s) Signed: 10/25/2022 2:04:38 PM By: Tommie Ard RN Entered By: Tommie Ard on 10/25/2022 10:53:49 -------------------------------------------------------------------------------- Vitals Details Patient Name: Date of Service: [name]. 10/25/2022 10:45 A M Medical Record Number: [id number] Patient Account Number: [id number] Date of Birth/Sex: Treating RN: 24-Dec-1950 (72 y.o. [facility] Kaynan Klonowski: [name] Other Clinician: Referring Loren Sawaya: Treating Deeandra Jerry/Extender: [name] in Treatment: 31 Vital Signs Time Taken: 10:37 Temperature (F): 98.6 Height (in): 74 Pulse (bpm): 72 Weight (lbs): 238 Respiratory Rate (breaths/min): 17 Body Mass Index (BMI): 30.6 Blood Pressure (mmHg): 123/71 Reference Range: 80 - 120  mg / dl Electronic Signature(s) Signed: 10/25/2022 2:04:38 PM By: Tommie Ard RN Entered By: Tommie Ard on 10/25/2022 10:38:53

## 2022-10-26 NOTE — Progress Notes (Signed)
[name], [name] ([id number]) [id number] Page 1 of 20 Visit Report for 10/25/2022 Chief Complaint Document Details Patient Name: Date of Service: [name], [name] 10/25/2022 10:45 A M Medical Record Number: [id number] Patient Account Number: [id number] Date of Birth/Sex: Treating RN: [birth year] (72 y.o. M) Primary Care Provider: [name] Other Clinician: Referring Provider: Treating Provider/Extender: [name] in Treatment: 92 Information Obtained from: Patient Chief Complaint Left leg and foot ulcers 04/12/2021; patient is here for wounds on his left lower leg and left plantar foot over the first metatarsal head Electronic Signature(s) Signed: 10/25/2022 11:36:51 AM By: [name] MD FACS Entered By: [name] on 10/25/2022 11:36:51 -------------------------------------------------------------------------------- Debridement Details Patient Name: Date of Service: [name]. 10/25/2022 10:45 A M Medical Record Number: [id number] Patient Account Number: [id number] Date of Birth/Sex: Treating RN: 19-Apr-1951 (73 y.o. Valma Cava Primary Care Provider: [name] Other Clinician: Referring Provider: Treating Provider/Extender: [name] in Treatment: 80 Debridement Performed for Assessment: Wound #[id number] Left,Plantar Metatarsal head first Performed By: Physician [name], MD Debridement Type: Debridement Severity of Tissue Pre Debridement: Fat layer exposed Level of Consciousness (Pre-procedure): Awake and Alert Pre-procedure Verification/Time Out Yes - 11:05 Taken: Start Time: 11:06 Pain Control: Lidocaine 4% T opical Solution Percent of Wound Bed Debrided: 100% T Area Debrided (cm): otal 0.82 Tissue and other material debrided: Viable, Non-Viable, Callus, Slough, Subcutaneous, Skin: Epidermis, Slough Level: Skin/Subcutaneous Tissue Debridement Description:  Excisional Instrument: Curette Bleeding: Minimum Hemostasis Achieved: Pressure Response to Treatment: Procedure was tolerated well Level of Consciousness (Post- Awake and Alert procedure): Post Debridement Measurements of Total Wound Length: (cm) 0.5 Width: (cm) 2.1 Depth: (cm) 0.1 Volume: (cm) 0.082 Character of Wound/Ulcer Post Debridement: Requires Further Debridement Severity of Tissue Post Debridement: Fat layer exposed Post Procedure Diagnosis Same as Pre-procedure Notes Scribed for Dr. Lady Gary by Tommie Ard, RN Electronic Signature(s) Signed: 10/25/2022 12:51:59 PM By: [name] MD FACS Signed: 10/25/2022 2:04:38 PM By: Tommie Ard RN [name] ([id number]) PM By: Tommie Ard RN [phone].pdf Page 2 of 20 Signed: 10/25/2022 2:04:38 Entered By: Tommie Ard on 10/25/2022 11:11:07 -------------------------------------------------------------------------------- Debridement Details Patient Name: Date of Service: [name], [name] 10/25/2022 10:45 A M Medical Record Number: [id number] Patient Account Number: [id number] Date of Birth/Sex: Treating RN: 1951/06/12 (72 y.o. Valma Cava Primary Care Provider: [name] Other Clinician: Referring Provider: Treating Provider/Extender: [name] in Treatment: 80 Debridement Performed for Assessment: Wound #22 Left,Proximal,Lateral Lower Leg Performed By: Physician [name], MD Debridement Type: Debridement Level of Consciousness (Pre-procedure): Awake and Alert Pre-procedure Verification/Time Out Yes - 11:05 Taken: Start Time: 11:06 Pain Control: Lidocaine 4% Topical Solution Percent of Wound Bed Debrided: 100% T Area Debrided (cm): otal 0.75 Tissue and other material debrided: Non-Viable, Eschar, Slough, Slough Level: Non-Viable Tissue Debridement Description: Selective/Open Wound Instrument: Curette Bleeding: Minimum Hemostasis Achieved:  Pressure Response to Treatment: Procedure was tolerated well Level of Consciousness (Post- Awake and Alert procedure): Post Debridement Measurements of Total Wound Length: (cm) 1.6 Width: (cm) 0.6 Depth: (cm) 0.1 Volume: (cm) 0.075 Character of Wound/Ulcer Post Debridement: Requires Further Debridement Post Procedure Diagnosis Same as Pre-procedure Notes Scribed for Dr. Lady Gary by Tommie Ard, RN Electronic Signature(s) Signed: 10/25/2022 12:51:59 PM By: [name] MD FACS Signed: 10/25/2022 2:04:38 PM By: Tommie Ard RN Entered By: Tommie Ard on 10/25/2022 11:11:36 -------------------------------------------------------------------------------- HPI Details Patient Name: Date of Service: [name]. 10/25/2022 10:45 A M Medical Record Number: [id number] Patient  Account Number: [id number] Date of Birth/Sex: Treating RN: [birth year] (72 y.o. M) Primary Care Provider: [name] Other Clinician: Referring Provider: Treating Provider/Extender: [name] in Treatment: 16 History of Present Illness HPI Description: 10/11/17; Mr. Biagioni is a 72 year old man who tells me that in 2015 he slipped down the latter traumatizing his left leg. He developed a wound in the same spot the area that we are currently looking at. He states this closed over for the most part although he always felt it was somewhat unstable. In 2016 he hit the same area with the door of his car had this reopened. He tells me that this is never really closed although sometimes an inflow it remains open on a constant basis. He has not been using any specific dressing to this except for topical antibiotics the nature of which were not really sure. His primary doctor did send him to see Dr. Jacinto Halim of interventional cardiology. He underwent an angiogram on 08/06/17 and he underwent a PTA and directional atherectomy of the lesser distal SFA and popliteal arteries which resulted in brisk  improvement in blood flow. It was noted that he had 2 vessel runoff through the anterior tibial and peroneal. He is also been to see vascular and interventional radiologist. He was not felt to have any significant superficial venous insufficiency. Presumably is not a candidate for any ablation. It was suggested he come here for wound care. [name], [name] ([id number]) [id number] Page 3 of 20 The patient is a type II diabetic on insulin. He also has a history of venous insufficiency. ABIs on the left were noncompressible in our clinic 10/21/17; patient we admitted to the clinic last week. 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 We put Iodosorb on him after an aggressive debridement and 3 layer compression. He complained of pain in his ankle and itching with is skin in fact he scratched the area on the medial calf superiorly at the rim of our wraps and he has 2 small open areas in that location today which are new. I changed his primary dressing today to silver collagen. As noted he is already had revascularization and does not have any significant superficial venous insufficiency that would be amenable to ablation 10/28/17; patient admitted to the clinic 2 weeks ago. He has a smaller Wound. Scratch injury from last week revealed. There is large wound over the tibial area. This is smaller. Granulation looks healthy. No need for debridement. 11/04/17; the wound on the left lateral calf looks better. Improved dimensions. Surface of this looks better. We've been maintaining him and Kerlix Coban wraps. He finds this much more comfortable. Silver collagen dressing 11/11/17; left lateral Wound continues to look healthy be making progress. Using a #5 curet I removed removed nonviable skin from the surface of the wound and then necrotic debris from the wound surface. Surface of the wound continues to look healthy. He  also has an open area on the left great toenail bed. We've been using topical antibiotics. 11/19/17; left anterior lateral wound continues to look healthy but it's not closed. He also had a small wound above this on the left leg Initially traumatic wounds in the setting of significant chronic venous insufficiency and stasis dermatitis 11/25/17; left anterior wounds superiorly is closed still a small wound inferiorly. 12/02/17; left anterior tibial area. Arrives today with adherent callus. Post debridement clearly not completely closed. Hydrofera Blue under 3  layer compression. 12/09/17; left anterior tibia. Circumferential eschar however the wound bed looks stable to improved. We've been using Hydrofera Blue under 3 layer compression 12/17/17; left anterior tibia. Apparently this was felt to be closed however when the wrap was taken off there is a skin tear to reopen wounds in the same area we've been using Hydrofera Blue under 3 layer compression 12/23/17 left anterior tibia. Not close to close this week apparently the [facility] was stuck to this again. Still circumferential eschar requiring debridement. I put a contact layer on this this time under the Hydrofera Blue 12/31/17; left anterior tibia. Wound is better slight amount of hyper-granulation. Using Hydrofera Blue over Adaptic. 01/07/18; left anterior tibia. The wound had some surface eschar however after this was removed he has no open wound.he was already revascularized by Dr. Jacinto Halim when he came to our clinic with atherectomy of the left SFA and popliteal artery. He was also sent to interventional radiology for venous reflux studies. He was not felt to have significant reflux but certainly has chronic venous changes of his skin with hemosiderin deposition around this area. He will definitely need to lubricate his skin and wear compression stocking and I've talked to him about this. READMISSION 05/26/2018 This is a now 72 year old man we cared  for with traumatic wounds on his left anterior lower extremity. He had been previously revascularized during that admission by Dr. Jacinto Halim. Apparently in follow-up Dr. Jacinto Halim noted that he had deterioration in his arterial status. He underwent a stent placement in the distal left SFA on 04/22/2018. 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 He went back to the angiography suite on 04/30/2018 he underwent PTA and balloon angioplasty of the occluded left mid anterior tibial artery, thrombotic occlusion went from 100 to 0% which reconstitutes the posterior tibial artery. He had thrombectomy and aspiration of the peroneal artery. The stent placed in the distal SFA left SFA was still occluded. 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 He has been recuperating at [facility] in [facility] after the most recent hospitalization. He is going home tomorrow. He tells me that 2 weeks ago he traumatized the tip of his left fifth toe. He came in urgently for our review of this. This was a history of before I noted that Dr. Jacinto Halim had already noted dry gangrenous changes of the left fifth toe 06/09/2018; 2-week follow-up. I did contact Dr. Jacinto Halim after his last appointment and he apparently saw 1 of Dr. Verl Dicker colleagues the next day. He does not follow-up with Dr. Jacinto Halim himself until Thursday of this week. 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 Nevertheless there is no evidence of infection no drainage and no pain. He had a new area that this week when we were signing him in today on the left anterior mid tibia area, this is in close proximity to the previous wound we have dealt with in this clinic. 06/23/2018; 2-week  follow-up. I did not receive a recent note from Dr. Jacinto Halim to review today. Our office is trying to obtain this. He is apparently not planning to do further vascular interventions and wondered about compression to try and help with the patient's chronic  venous insufficiency. However we are also concerned about the arterial flow. He arrives in clinic today with a new area on the left third toe. The areas on the calf/anterior tibia are close to closing. The left fifth toe is still mummified using Betadine. -In reviewing things with the patient he has what sounds like claudication with mild to moderate amount of activity. 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 I prescribed Levaquin over the phone while we attempted to arrange a plan of care. However the patient called yesterday to report he had low-grade fever and he came in today acutely. There is been a marked deterioration in the left third toe with spreading cellulitis up into the dorsal left foot. He was referred to the emergency room. Readmission: 06/29/2020 patient presents today for reevaluation here in our clinic he was previously treated by Dr. Leanord Hawking at the latter part of 2019 in 2 the beginning of 2020. Subsequently we have not seen him since that time in the interim he did have evaluation with vein and vascular specialist specifically Dr. Bo Mcclintock who did perform quite extensive work for a left femoral to anterior tibial artery bypass. With that being said in the interim the patient has developed significant lymphedema and has wounds that he tells me have really never healed in regard to the incision site on the left leg. He also has multiple wounds on the feet for various reasons some of which is that he tends to pick at his feet. Fortunately there is no signs of active infection systemically at this time he does have some wounds that are little bit deeper but most are fairly superficial he seems to have  good blood flow and overall everything appears to be healthy I see no bone exposed and no obvious signs of osteomyelitis. I do not know that he necessarily needs a x-ray at this point although that something we could consider depending on how things progress. The patient does have a history of lymphedema, diabetes, this is type II, chronic kidney disease stage III, hypertension, and history of peripheral vascular disease. 07/05/2020; patient admitted last week. Is a patient I remember from 2019 he had a spreading infection involving the left foot and we sent him to the hospital. 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 He subsequently had a left femoral to anterior tibial bypass by Dr.Cain vein and vascular. He also has severe lymphedema with chronic skin changes related to that on the left leg. The most problematic area that was new today was on the left medial great toe. This was apparently a small area last week there was purulent drainage which our intake nurse cultured. Also areas on the left medial foot and heel left lateral foot. He has 2 areas on the left medial calf left lateral calf in the setting of the severe lymphedema. 07/13/2020 on evaluation today patient appears to be doing better in my opinion compared to his last visit. The good news is there is no signs of active infection systemically and locally I do not see any signs of infection either. He did have an x-ray which was negative that is great news he had a culture which showed MRSA but at the same time he is been on the doxycycline which has helped. I do think we may want to extend this for 7 additional days 1/25; patient admitted to the clinic a few weeks ago. 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 We have been putting him under compression his edema control is a lot better but he is severe verricused skin on the left leg. He is really done  quite well he still has an open area on the left medial calf and the left medial first metatarsal head. We have been using silver collagen on the leg silver alginate on the foot 07/27/2020 upon evaluation today patient appears to be doing decently well in regard to his wounds. He still has a lot of dry skin on the left leg. Some of this is starting to peel back and I think he may be able to have them out by removing some that today. Fortunately there is no signs of active infection at this time on the left leg although on the right leg he does appear to have swelling and erythema as well as some mild warmth to touch. This does have been concerned [name], [name] ([id number]) [id number] Page 4 of 20 about the possibility of cellulitis although within the differential diagnosis I do think that potentially a DVT has to be at least considered. We need to rule that out before proceeding would just call in the cellulitis. Especially since he is having pain in the posterior aspect of his calf muscle. 2/8; the patient had seen sparingly. He has severe skin changes of chronic lymphedema in the left leg thickened hyperkeratotic verrucous skin. He has an open wound on the medial part of the left first met head left mid tibia. He also has a rim of nonepithelialized skin in the anterior mid tibia. He brought in the AmLactin lotion that was been prescribed although I am not sure under compression and its utility. There concern about cellulitis on the right lower leg the last time he was here. He was put on on antibiotics. His DVT rule out was negative. The right leg looks fine he is using his stocking on this area 08/10/2020 upon evaluation today patient appears to be doing well with regard to his leg currently. He has been tolerating the dressing changes without complication. Fortunately there is no signs of active infection which is great news. Overall very pleased with where things  stand. 2/22; the patient still has an area on the medial part of the left first met his head. This looks better than when I last saw this earlier this month he has a rim of epithelialization but still some surface debris. Mostly everything on the left leg is healed. There is still a vulnerable in the left mid tibia area. 08/30/2020 upon evaluation today patient appears to be doing much better in regard to his wounds on his foot. 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 Nonetheless I think we will address that today I Minna send in a prescription for him in that regard. Overall though there does not appear to be any signs of significant worsening. 09/07/2020 on evaluation today patient's wounds over his left foot appear to be doing excellent. I do not see any signs of infection there is some callus buildup this can require debridement for certain but overall I feel like he is managing quite nicely. He still using the AmLactin cream which has been beneficial for him as well. 3/22; left foot wound is closed. There is no open area here. He is  using ammonium lactate lotion to the lower extremities to help exfoliate dry cracked skin. He has compression stockings from elastic therapy in [place]. The wound on the medial part of his left first met head is healed today. READMISSION 04/12/2021 Mr. Hobbs is a patient we know fairly well he had a prolonged stay in clinic in 2019 with wounds on his left lateral and left anterior lower extremity in the setting of chronic venous insufficiency. More recently he was here earlier this year with predominantly an area on his left foot first metatarsal head plantar and he says the plantar foot broke down on its not long after we discharged him but he did not come back here. The last few months areas of broken down on his left anterior  and again the left lateral lower extremity. The leg itself is very swollen chronically enlarged a lot of hyperkeratotic dry Berry Q skin in the left lower leg. His edema extends well into the thigh. He was seen by Dr. Randie Heinz. He had ABIs on 03/02/2021 showing an ABI on the right of 1 with a TBI of 0.72 his ABI in the left at 1.09 TBI of 0.99. Monophasic and biphasic waveforms on the right. On the left monophasic waveforms were noted he went on to have an angiogram on 03/27/2021 this showed the aortic aortic and iliac segments were free of flow-limiting stenosis the left common femoral vein to evaluate the left femoral to anterior tibial artery bypass was unobstructed the bypass was patent without any areas of stenosis. We discharged the patient in bilateral juxta lite stockings but very clearly that was not sufficient to control the swelling and maintain skin integrity. He is clearly going to need compression pumps. 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 This is fortunate because he is on his feet for long periods of time. 10/27; patient comes in with our intake nurse reporting copious amount of green drainage from the left anterior mid tibia the left dorsal foot and to a lesser extent the left medial mid tibia. We left the compression wrap on all week for the amount of edema in his left leg is quite a bit better. We use silver alginate as the primary dressing 11/3; edema control is good. Left anterior lower leg left medial lower leg and the plantar first metatarsal head. The left anterior lower leg required debridement. Deep tissue culture I did of this wound showed MRSA I put him on 10 days of doxycycline which she will start today. We have him in compression wraps. He has a security card and [employer] however he is retiring on November 15. We will need to then get him into a better offloading boot for the left foot perhaps a total contact cast 11/10;  edema control is quite good. Left anterior and left medial lower leg wounds in the setting of chronic venous insufficiency and lymphedema. He also has a substantial area over the left plantar first metatarsal head. 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 He has significant hypergranulation on the left plantar foot wound. The patient is a diabetic but he does not have significant PAD 11/17; edema control is quite good. Left anterior and left medial lower leg wounds look better. The really concerning area remains the area on the left plantar first metatarsal head. He has a  rim of epithelialization. 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 Starting today with a forefoot off loader but . possibly a total contact cast. 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 Arrives with a very significant odor from I think all of the wounds on the left leg and the left foot. Because of this I did not put a total contact cast on him today but will could still consider this. His wife was having cataract surgery which is the reason he missed the appointment 12/6. I saw this man 5 days ago with a swelling below the popliteal fossa. I thought he actually might have a Baker's cyst however the DVT rule out study that we could arrange right away was negative the technician told me this was not a ruptured Baker's cyst. We attempted to get this aspirated by under ultrasound guidance in interventional radiology however all they did was an ultrasound however it shows an extensive fluid collection 62 x 8 x 9.4 in the left thigh and left calf. The patient states he thinks this started 8 days ago or so but he really is  not complaining of any pain, fever or systemic symptoms. He has not ha 12/20; after some difficulty I managed to get the patient into see Dr. Randie Heinz. Eventually he was taken into the hospital and had a drain put in the fluid collection below his left knee posteriorly extending into the posterior thigh. He still has the drain in place. Culture of this showed moderate staff aureus few Morganella and few Klebsiella he is now on doxycycline and ciprofloxacin as suggested by infectious disease he is on this for a month. The drain will remain in place until it stops draining 12/29; he comes in today with the 1 wound on his left leg and the area on the left plantar first met head significantly smaller. Both look healthy. He still has the drain in the left leg. He says he has to change this daily. Follows up with Dr. Randie Heinz on January 11. 06/29/2021; the wounds that I am following on the left leg and left first met head continued to be quite healthy. However the area where his inferior drain is in place had copious amounts of drainage which was green in color. The wound here is larger. Follows up with Dr. Pascal Lux of vein and vascular his surgeon next week as well as infectious disease. He remains on ciprofloxacin and doxycycline. He is not complaining of excessive pain in either one of the drain areas 1/12; the patient saw vascular surgery and infectious disease. Vascular surgery has left the drain in place as there was still some notable drainage still see him back in 2 weeks. Dr. Dorthula Perfect stop the doxycycline and ciprofloxacin and I do not believe he follows up with them at this point. Culture I did last week showed both doxycycline resistant MRSA and Pseudomonas not sensitive to ciprofloxacin although only in rare titers 1/19; the patient's wound on the left anterior lower leg is just about healed. We have continued healing of the area that was medially on the left leg. Left first plantar metatarsal head continues to  get smaller. The major problem here is his 2 drain sites 1 on the left upper  calf and lateral thigh. There is purulent drainage still from the left lateral thigh. I gave him [name], [name] ([id number]) [id number] Page 5 of 20 antibiotics last week but we still have recultured. He has the drain in the area I think this is eventually going to have to come out. I suspect there will be a connecting wound to heal here perhaps with improved VAc 1/26; the patient had his drain removed by vein and vascular on 1/25/. This was a large pocket of fluid in his left thigh that seem to tunnel into his left upper calf. 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 His mention his Penrose drain was removed today. He now has a tunneling wound on his left calf and left thigh. Both of these probe widely towards each other although I cannot really prove that they connect. Both wounds on his lower leg anteriorly are closed and his area over the first metatarsal head on his right foot continues to improve. We are using Hydrofera Blue here. He also saw infectious disease culture of the abscess they noted was polymicrobial with MRSA, Morganella and Klebsiella he was treated with doxycycline and ciprofloxacin for 4 weeks ending on 07/03/2021. They did not recommend any further antibiotics. Notable that while he still had the Penrose drain in place last week he had purulent drainage coming out of the inferior IandD site this grew [facility], MRSA and Pseudomonas but there does not appear to be any active infection in this area today with the drain out and he is not systemically unwell 2/2; with regards to the drain sites the superior one on the thigh actually is closed down the one on the upper left lateral calf measures about 8 and half centimeters which is an improvement seems to be less prominent although still with a lot of drainage. The only remaining wound is over the first  metatarsal head on the left foot and this looks to be continuing to improve with Hydrofera Blue. 2/9; the area on his plantar left foot continues to contract. Callus around the wound edge. The drain sites specifically have not come down in depth. We put the wound VAC on Monday he changed the canister late last night our intake nurse reported a pocket of fluid perhaps caused by our compression wraps 2/16; continued improvement in left foot plantar wound. drainage site in the calf is not improved in terms of depth (wound vac) 2/23; continued improvement in the left foot wound over the first metatarsal head. With regards to the drain sites the area on his thigh laterally is healed however the open area on his calf is small in terms of circumference by still probes in by about 15 cm. Within using the wound VAC. Hydrofera Blue on his foot 08/24/2021: The left first metatarsal head wound continues to improve. The wound bed is healthy with just some surrounding callus. Unfortunately the open drain site on his calf remains open and tunnels at least 15 cm (the extent of a Q-tip). This is despite several weeks of wound VAC treatment. Based on reading back through the notes, there has been really no significant change in the depth of the wound, although the orifice is smaller and the more cranial wound on his thigh has closed. I suspect the tunnel tracks nearly all the way to this location. 08/31/2021: Continued improvement in the left first metatarsal head wound. There has been absolutely no improvement to the long tunnel from his open drain site  on his calf. We have tried to get him into see vascular surgery sooner to consider the possibility of simply filleting the tract open and allowing it to heal from the bottom up, likely with a wound VAC. They have not yet scheduled a sooner appointment than his current mid April 09/14/2021: He was seen by vascular surgery and they took him to the operating room last week.  They opened a portion of the tunnel, but did not extend the entire length of the known open subcutaneous tract. I read Dr. Darcella Cheshire operative note and it is not clear from that documentation why only a portion of the tract was opened. The heaped up granulation tissue was curetted and removed from at least some portion of the tract. They did place a wound VAC and applied an Unna boot to the leg. The ulcer on his left first metatarsal head is smaller today. The bed looks good and there is just a small amount of surrounding callus. 09/21/2021: The ulcer on his left first metatarsal head looks to be stalled. There is some callus surrounding the wound but the wound bed itself does not appear particularly dynamic. The tunnel tract on his lateral left leg seems to be roughly the same length or perhaps slightly smaller but the wound bed appears healthy with good granulation tissue. He opened up a new wound on his medial thigh and the site of a prior surgical incision. He says that he did this unconsciously in his sleep by scratching. 09/28/2021: Unfortunately, the ulcer on his left first metatarsal head has extended underneath the callus toward the dorsum of his foot. The medial thigh wounds are roughly the same. The tunnel on his lateral left leg continues to be problematic; it is longer than we are able to actually probe with a Q-tip. I am still not certain as to why Dr. Randie Heinz did not open this up entirely when he took the patient to the operating room. We will likely be back in the same situation with just a small superficial opening in a long unhealed tract, as the open portion is granulating in nicely. 10/02/2021: The patient was initially scheduled for a nurse visit, but we are also applying a total contact cast today. The plantar foot wound looks clean without significant accumulated callus. We have been applying Prisma silver collagen to the site. 10/05/2021: The patient is here for his first total contact  cast change. We have tried using gauze packing strips in the tunnel on his lateral leg wound, but this does not seem to be working any better than the white VAC foam. The foot ulcer looks about the same with minimal periwound callus. Medial thigh wound is clean with just some overlying eschar. 10/12/2021: The plantar foot wound is stable without any significant accumulation of periwound callus. The surface is viable with good granulation tissue. The medial thigh wounds are much smaller and are epithelializing. On the other hand, he had purulent drainage coming from the tunnel on his lateral leg. He does go back to see Dr. Randie Heinz next week and is planning to ask him why the wound tunnel was not completely opened at the time of his most recent operation. 10/19/2021: The plantar foot wound is markedly improved and has epithelial tissue coming through the surface. The medial thigh wounds are nearly closed with just a tiny open area. He did see Dr. Randie Heinz earlier this week and apparently they did discuss the possibility of opening the sinus tract further and enabling a  wound VAC application. Apparently there are some limits as to what Dr. 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 I think if we could get the tract open to the level of the popliteal fossa, this would greatly aid in her ability to get this chart closed. That being said, however, today when I probed the tract with a Q-tip, I was not able to insert the entirety of the Q-tip as I have on previous occasions. The tunnel is shorter by about 4 cm. The surface is clean with good granulation tissue and no further episodes of purulent drainage. 10/30/2021: Last week, the patient underwent surgery and had the long tract in his leg opened. There was a rind that was debrided, according to the operative report. His medial thigh ulcers are closed. The plantar foot wound is clean with a good surface and some built up surrounding  callus. 11/06/2021: The overall dimensions of the large wound on his lateral leg remain about the same, but there is good granulation tissue present and the tunneling is a little bit shorter. He has a new wound on his anterior tibial surface, in the same location where he had a similar lesion in the past. The plantar foot wound is clean with some buildup surrounding callus. Just toward the medial aspect of his foot, however, there is an area of darkening that once debrided, revealed another opening in the skin surface. 11/13/2021: The anterior tibial surface wound is closed. The plantar foot wound has some surrounding callus buildup. The area of darkening that I debrided last week and revealed an opening in the skin surface has closed again. The tunnel in the large wound on his lateral leg has come in by about 3 cm. There is healthy granulation tissue on the entire wound surface. 11/23/2021: The patient was out of town last week and did wet-to-dry dressings on his large wound. 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 Unfortunately, he picked open the wound on his left medial thigh. He says that it was itching and he just could not stop scratching it until it was open again. The wound on his plantar foot is smaller and has not accumulated a tremendous amount of callus. The lateral leg wound is shallower and the tunnel has also decreased in depth. There is just a little bit of slough accumulation on the surface. 11/30/2021: Another portion of his left medial thigh has been opened up. All of these wounds are fairly superficial with just a little bit of slough and eschar accumulation. The wound on his plantar foot is almost closed with just a bit of eschar and periwound callus accumulation. The lateral leg wound is nearly flush with the surrounding skin and the tunnel is markedly shallower. 12/07/2021: There is just 1 open area on his left  medial thigh. It is clean with just a little bit of perimeter eschar. The wound on his plantar foot continues to contract and just has some eschar and periwound callus accumulation. The lateral leg wound is closing at the more distal aspect and the tunnel is smaller. The surface is nearly flush with the surrounding skin and it has a good bed of granulation tissue. 12/14/2021: The thigh and foot wounds are closed. The lateral leg wound has closed over approximately half of its length. The tunnel continues to contract and CALUM, STLOUIS ([id number]) [id number] Page 6 of 20 the surface is now flush with  the surrounding skin. The wound bed has robust granulation tissue. 12/22/2021: The thigh and foot wounds have reopened. The foot wound has a lot of callus accumulation around and over it. The thigh wound is tiny with just a little bit of slough in the wound bed. The lateral leg wound continues to contract. His vascular surgeon took the wound VAC off earlier in the week and the patient has been doing wet-to-dry dressings. There is a little slough accumulation on the surface. The tunnel is about 3 cm in depth at this point. 12/28/2021: The thigh wound is closed again. The foot wound has some callus that subsequently has peeled back exposing just a small slit of a wound. The lateral leg wound Is down to about half the size that it originally was and the tunnel is down to about half a centimeter in depth. 01/04/2022: The thigh wound remains closed. The foot wound has heavy callus overlying the wound site. Once this was debrided, the wound was found to be closed. The lateral leg wound is smaller again this week and very superficial. No tunnel could be identified. 01/12/2022: The thigh and foot wounds both remain closed. The lateral leg wound is now nearly flush with the skin surface. There is good granulation tissue present with a light layer of slough. 01/19/2022: Due to the way his wrap  was placed, the patient did not change the dressing on his thigh at all and so the foam was saturated and his skin is macerated. There is a light layer of slough on the wound surface. The underlying granulation tissue is robust and healthy-appearing. He has heavy callus buildup at the site of his first metatarsal head wound which is still healed. 02/01/2022: He has been in silver alginate. When he removed the dressing from his thigh wound, however, some leg, superficially reopening a portion of the wound that had healed. In addition, underneath the callus at his left first metatarsal head, there appears to be a blister and the wound appears to be open again. 02/08/2022: The lateral leg wound has contracted substantially. There is eschar and a light layer of slough present. He says that it is starting to pull and is uncomfortable. On inspection, there is some puckering of the scar and the eschar is quite dry; this may account for his symptoms. On his first metatarsal head, the wound is much smaller with just some eschar on the surface. The callus has not reaccumulated. He reports that he had a blister come up on his medial thigh wound at the distal aspect. It popped and there is now an opening in his skin again. Looking back through his library of wound photos, there is what looks like a permanent suture just deep to this location and it may be trying to erode through. We have been using silver alginate on his wounds. 02/15/2022: The lateral leg wound is about half the size it was last week. It is clean with just a little perimeter eschar and light slough. The wound on his first metatarsal head is about the same with heavy callus overlying it. The medial thigh wound is closed again. He does have some skin changes on the top of his foot that looks potentially yeast related. 02/22/2022: The skin on the top of his foot improved with the use of a topical antifungal. The lateral leg wound continues to contract  and is again smaller this week. There is a little bit of slough and eschar on the surface. The first metatarsal  head wound is a little bit smaller but has reaccumulated a thick callus over the top. He decided to try to trim his toenail and ultimately took the entire nail off of his left great toe. 03/02/2022: His lateral leg wound continues to improve, as does the wound on his left great toe. Unfortunately, it appears that somehow his foot got wet and moisture seeped in through the opening causing his skin to lift. There is a large wound now overlying his first metatarsal on both the plantar, medial, and dorsal portion of his foot. There is necrotic tissue and slough present underneath the shaggy macerated skin. 03/08/2022: The lateral leg wound is smaller again today. There is just a light layer of slough and eschar on the surface. The great toe wound is smaller again today. The first metatarsal wound is a little bit smaller today and does not look nearly as necrotic and macerated. There is still slough and nonviable tissue present. 03/15/2022: The lateral leg wound is narrower and just has a little bit of light slough buildup. The first metatarsal wound still has a fair amount of moisture affecting the periwound skin. The great toe wound is healed. 03/22/2022: The lateral leg wound is now isolated to just at the level of his knee. There is some eschar and slough accumulation. The first metatarsal head wound has epithelialized tremendously and is about half the size that it was last week. He still has some maceration on the top of his foot and a fungal odor is present. 03/29/2022: T oday the patient's foot was macerated, suggesting that the cast got wet. The patient has also been picking at his dry skin and has enlarged the wound on his left lateral leg. In the time between having his cast removed and my evaluation, he had picked more dry skin and opened up additional wounds on his Achilles area and  dorsal foot. The plantar first metatarsal head wound, however, is smaller and clean with just macerated callus around the perimeter and light slough on the surface. The lateral leg wound measured a little bit larger but is also fairly clean with eschar and minimal slough. 04/02/2022: The patient had vascular studies done last Friday and so his cast was not applied. He is here today to have that done. 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 04/05/2022: Both wounds are smaller and quite clean. There is just a little biofilm on the lateral leg wound. 10/20; the patient has a wound on the left lateral surgical incision at the level of his lateral knee this looks clean and improved. He is using silver alginate. He also has an area on his left medial foot for which she is using Hydrofera Blue under a total contact cast both wounds are measuring smaller 04/20/2022: The plantar foot wound has contracted considerably and is very close to closing. The lateral leg wound was measured a little larger, but there was a tiny open area that was included in the measurements that was not included last week. He has some eschar around the perimeter but otherwise the wound looks clean. 04/27/2022: The lateral leg wound looks better this week. He says that midweek, he felt it was very dry and began applying hydrogel to the site. I think this was beneficial. The foot wound is nearly closed underneath a thick layer of dry skin and callus. 05/04/2022: The foot wound is healed. He has developed a new small ulcer on his anterior tibial surface about midway up his leg.  It has a little slough on the surface. The lateral leg wound still is fairly dry, but clean with just a little biofilm on the surface. 05/11/2022: The wound on his foot reopened on Wednesday. A large blister formed which then broke open revealing the fat layer underneath. The ulcer on his anterior tibial surface is a little bit larger this week. The  lateral leg wound has much better moisture balance this week. Fortunately, prior to his foot wound reopening, he did get the cast made for his orthotic. 05/15/2022: Already, the left medial foot wound has improved. The tissue is less macerated and the surface is clean. The ulcer on his anterior tibial surface continues to enlarge. This seems likely secondary to accumulated moisture. The lateral leg wound continues to have an improved moisture balance with the use of collagen. 05/25/2022: The medial foot wound continues to contract. It is now substantially smaller with just a little slough on the surface. The anterior tibial surface wound continues to enlarge further. Once again, this seems to be secondary to moisture. The lateral leg wound does not seem to be changing much in size, but the moisture balance is better. 06/01/2022: The anterior tibial wound is closed. The medial foot wound is down to just a very small, couple of millimeters, opening. The lateral leg wound has good moisture balance, but remains unchanged in size. 12/15; the patient's anterior tibial wound has reopened, however the area on his right first metatarsal head is closed. The major wound is actually on the superior part of his surgical wound in the left lateral thigh. Not a completely viable surface under illumination. This may at JADELL, VELTRI ([id number]) [id number] Page 7 of 20 some point require a debridement I think he is currently using Prisma. As noted the left medial foot wound has closed 06/14/2022: The anterior tibial wound has closed. The lateral leg wound has a better surface but is basically unchanged in size. The left medial foot wound has reopened. It looks as though there was some callus accumulation and moisture got under the callus which caused the tissue to break down again. 06/21/2022: A new wound has opened up just distal to the previous anterior tibial wound. It is small but has  hypertrophic granulation tissue present. The lateral leg wound is a little bit narrower and has a layer of slough on the surface. The left medial foot wound is down to just a pinhole. His custom orthotics should be available next week. 06/28/2022: The wound on his first metatarsal head has healed. He has developed a new small wound on his medial lower leg, in an old scar site. The lateral leg wound continues to contract but continues to accumulate slough, as well. 07/03/2022: Despite wearing his custom orthopedic shoes, he managed to reopen the wound on his first metatarsal head. He says he thinks his foot got wet and then some skin lifted up and he peeled this away. Both of the lower leg wounds are smaller and have some dry eschar on the surface. The lateral leg wound is quite a bit narrower today. 07/12/2022: The medial lower leg wound is closed. The anterior lower leg wound has contracted considerably. The lateral upper leg wound is narrower with a layer of slough on the surface. The first metatarsal head wound is also smaller, but had copious drainage which saturated the foam border dressing and resulted in some periwound tissue maceration. Fortunately there was no breakdown at this site. 07/19/2022: The lower leg shows signs of  significant maceration; I think he must be sweating excessively inside his cast. There are several areas of skin breakdown present. The wound on his foot is smaller and that on his lateral leg is narrower and is shorter by about a centimeter. 07/26/2022: Last week we used a zinc Coflex wrap prior to applying his total contact cast and this has had the effect of keeping his skin from getting macerated this week. The anterior leg wound has epithelialized substantially. The lateral leg wound is significantly smaller with just a bit of slough on the surface. The first metatarsal head wound is also smaller this week. 08/02/2022: The anterior leg wound was closed on arrival, but while he  was sitting in the room, he picked it open again. The lateral leg wound is smaller with just a little slough on the surface and the first metatarsal head wound has contracted further, as well. 08/09/2022: The first metatarsal head wound is covered with callus. Underneath the callus, it is nearly completely closed. The lateral leg wound is smaller again this week. The anterior leg wound looks better, but he has such heavy buildup of old skin, that moisture is getting underneath this, becoming trapped, and causing the underlying skin to get macerated and open up. 08/16/2022: The first metatarsal head wound is closed. The lateral leg wound continues to contract and is quite a bit smaller again this week. There is just a small, superficial opening remaining on his anterior tibial surface. 08/23/2022: The first metatarsal head wound has, by some miracle, remained closed. The lateral leg wound is substantially smaller with multiple areas of epithelialization. The anterior tibial surface wound is also quite a bit smaller and very clean. 08/30/2022: Unfortunately, his first metatarsal head wound opened up again. It happened in the same fashion as it has on prior occasions. Moisture got under dried skin/callus and created a wound when he removed his sock, taking the skin with it. The anterior tibial surface has a thick shell of hyperkeratotic skin. This has been contributing to ongoing repeat wounding events as moisture gets underneath this and causes tissue breakdown. 3/15; patient presents for follow-up. His anterior left leg wound has healed. He still has the wound to the left lateral aspect and left first met head. We have been using silver alginate and endoform to these areas under [employer]. He has no issues or complaints today. He has been taking Augmentin and reports improvement to his symptoms to the left first met head. 09/13/2022: He has accumulated more thick dry skin in sheets on his lower leg. The  lateral leg wound is about the same size and the left first metatarsal head wound is a little bit smaller. There is slough on both surfaces. There is callus buildup around the foot wound. 09/20/2022: The lateral leg wound is a little bit narrower and the left first metatarsal head wound also seems to have contracted slightly. There is slough on both surfaces. He has a little skin breakdown on his anterior tibial surface. 09/27/2022: The lateral leg wound continues to contract and is quite clean. The first metatarsal head wound is also smaller. There is some perimeter callus and slough accumulation on the foot. The anterior tibial surface is closed. 10/04/2022: Both of his wounds are smaller today, particularly the first metatarsal head wound. 10/18/2022: He missed his appointment last week and ended up cutting off his wrap on Saturday. The anterior tibial wound reopened. It is fairly superficial with a little bit of slough on the surface.  His lateral leg wound is smaller with some slough and eschar buildup. The first metatarsal head wound is also smaller with some callus and slough accumulation. 10/25/2022: All wounds are smaller. There is slough and eschar on the lateral leg and slough and callus on the plantar foot wound. The anterior tibial wound is clean and flush with the surrounding skin. No debris accumulation here. Electronic Signature(s) Signed: 10/25/2022 12:07:44 PM By: [name] MD FACS Entered By: [name] on 10/25/2022 12:07:44 -------------------------------------------------------------------------------- Physical Exam Details Patient Name: Date of Service: [name]. 10/25/2022 10:45 A M Medical Record Number: [id number] Patient Account Number: [id number] Date of Birth/Sex: Treating RN: Oct 31, 1950 (72 y.o. M) Primary Care Provider: [name] Other Clinician: Referring Provider: Treating Provider/Extender: [name] in Treatment:  80 Constitutional . . . . no acute distress. Respiratory [name], [name] ([id number]) [id number] Page 8 of 20 Normal work of breathing on room air. Notes 10/25/2022: All wounds are smaller. There is slough and eschar on the lateral leg and slough and callus on the plantar foot wound. The anterior tibial wound is clean and flush with the surrounding skin. No debris accumulation here. Electronic Signature(s) Signed: 10/25/2022 12:08:14 PM By: [name] MD FACS Entered By: [name] on 10/25/2022 12:08:14 -------------------------------------------------------------------------------- Physician Orders Details Patient Name: Date of Service: [name]. 10/25/2022 10:45 A M Medical Record Number: [id number] Patient Account Number: [id number] Date of Birth/Sex: Treating RN: 09-23-1950 (72 y.o. Valma Cava Primary Care Provider: [name] Other Clinician: Referring Provider: Treating Provider/Extender: [name] in Treatment: 71 Verbal / Phone Orders: No Diagnosis Coding ICD-10 Coding Code Description L97.528 Non-pressure chronic ulcer of other part of left foot with other specified severity L85.9 Epidermal thickening, unspecified L97.828 Non-pressure chronic ulcer of other part of left lower leg with other specified severity I87.322 Chronic venous hypertension (idiopathic) with inflammation of left lower extremity E11.51 Type 2 diabetes mellitus with diabetic peripheral angiopathy without gangrene I89.0 Lymphedema, not elsewhere classified Follow-up Appointments ppointment in 1 week. - Dr Lady Gary - Room 2 Return A Anesthetic Wound #22 Left,Proximal,Lateral Lower Leg (In clinic) Topical Lidocaine 4% applied to wound bed Bathing/ Shower/ Hygiene May shower with protection but do not get wound dressing(s) wet. Protect dressing(s) with water repellant cover (for example, large plastic bag) or a cast cover and may  then take shower. Edema Control - Lymphedema / SCD / Other Avoid standing for long periods of time. Patient to wear own compression stockings every day. - on right leg; Moisturize legs daily. Compression stocking or Garment 20-30 mm/Hg pressure to: - left leg daily Off-Loading Wound #18R Left,Plantar Metatarsal head first Open toe surgical shoe to: - Front off loader Left ft Other: - minimal weight bearing left foot Wound Treatment Wound #18R - Metatarsal head first Wound Laterality: Plantar, Left Cleanser: Soap and Water 1 x Per Week/30 Days Discharge Instructions: May shower and wash wound with dial antibacterial soap and water prior to dressing change. Cleanser: Wound Cleanser 1 x Per Week/30 Days Discharge Instructions: Cleanse the wound with wound cleanser prior to applying a clean dressing using gauze sponges, not tissue or cotton balls. Prim Dressing: Sorbalgon AG Dressing 2x2 (in/in) 1 x Per Week/30 Days ary Discharge Instructions: Apply to wound bed as instructed Secondary Dressing: Optifoam Non-Adhesive Dressing, 4x4 in 1 x Per Week/30 Days Discharge Instructions: Apply over primary dressing as directed. Secondary Dressing: Zetuvit Plus 4x8 in 1 x Per Week/30 Days Discharge Instructions: Apply  over primary dressing as directed. [name], [name] ([id number]) [id number] Page 9 of 20 Compression Wrap: CoFlex Zinc Unna Boot, 4 x 6 (in/yd) 1 x Per Week/30 Days Discharge Instructions: Apply Coflex Zinc [employer] as directed. Wound #22 - Lower Leg Wound Laterality: Left, Lateral, Proximal Cleanser: Soap and Water 3 x Per Week/30 Days Discharge Instructions: May shower and wash wound with dial antibacterial soap and water prior to dressing change. Cleanser: Wound Cleanser 3 x Per Week/30 Days Discharge Instructions: Cleanse the wound with wound cleanser prior to applying a clean dressing using gauze sponges, not tissue or cotton balls. Peri-Wound Care: Sween  Lotion (Moisturizing lotion) 3 x Per Week/30 Days Discharge Instructions: Apply moisturizing lotion to the leg Topical: Skintegrity Hydrogel 4 (oz) 3 x Per Week/30 Days Discharge Instructions: Apply hydrogel as directed Prim Dressing: Endoform 2x2 in 3 x Per Week/30 Days ary Discharge Instructions: Moisten with Hydrogel or saline Prim Dressing: Maxorb Extra CMC/Alginate Dressing, 4x4 (in/in) 3 x Per Week/30 Days ary Discharge Instructions: Apply to wound bed as instructed Secondary Dressing: ABD Pad, 8x10 (Generic) 3 x Per Week/30 Days Discharge Instructions: Apply over primary dressing as directed. Secured With: Elastic Bandage 4 inch (ACE bandage) 3 x Per Week/30 Days Discharge Instructions: Secure with ACE bandage as directed. Wound #32 - Lower Leg Wound Laterality: Left, Medial, Anterior Cleanser: Soap and Water Discharge Instructions: May shower and wash wound with dial antibacterial soap and water prior to dressing change. Prim Dressing: Maxorb Extra Ag+ Alginate Dressing, 4x4.75 (in/in) ary Discharge Instructions: Apply to wound bed as instructed Electronic Signature(s) Signed: 10/25/2022 12:51:59 PM By: [name] MD FACS Entered By: [name] on 10/25/2022 12:08:26 -------------------------------------------------------------------------------- Problem List Details Patient Name: Date of Service: [name]. 10/25/2022 10:45 A M Medical Record Number: [id number] Patient Account Number: [id number] Date of Birth/Sex: Treating RN: [birth year] (71 y.o. M) Primary Care Provider: [name] Other Clinician: Referring Provider: Treating Provider/Extender: [name] in Treatment: 53 Active Problems ICD-10 Encounter Code Description Active Date MDM Diagnosis L97.528 Non-pressure chronic ulcer of other part of left foot with other specified 08/26/2022 No Yes severity L85.9 Epidermal thickening, unspecified 08/30/2022 No Yes L97.828  Non-pressure chronic ulcer of other part of left lower leg with other specified 04/12/2021 No Yes severity I87.322 Chronic venous hypertension (idiopathic) with inflammation of left lower 04/12/2021 No Yes extremity KYAL, SABHARWAL ([id number]) [id number] Page 10 of 20 E11.51 Type 2 diabetes mellitus with diabetic peripheral angiopathy without gangrene 04/12/2021 No Yes I89.0 Lymphedema, not elsewhere classified 04/12/2021 No Yes Inactive Problems ICD-10 Code Description Active Date Inactive Date E11.621 Type 2 diabetes mellitus with foot ulcer 04/12/2021 04/12/2021 L97.828 Non-pressure chronic ulcer of other part of left lower leg with other specified severity 06/08/2022 06/08/2022 E11.42 Type 2 diabetes mellitus with diabetic polyneuropathy 04/12/2021 04/12/2021 L02.416 Cutaneous abscess of left lower limb 06/13/2021 06/13/2021 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 07/20/2021 07/20/2021 Resolved Problems Electronic Signature(s) Signed: 10/25/2022 11:35:55 AM By: [name] MD FACS Entered By: [name] on 10/25/2022 11:35:55 -------------------------------------------------------------------------------- Progress Note Details Patient Name: Date of Service: [name]. 10/25/2022 10:45 A M Medical Record Number: [id number] Patient Account Number: [id number] Date of Birth/Sex: Treating RN: August 15, 1950 (72 y.o. M) Primary Care Provider: [name] Other Clinician: Referring Provider: Treating Provider/Extender: [name] in Treatment: 44 Subjective Chief Complaint Information obtained from Patient Left leg and foot ulcers 04/12/2021; patient is here for wounds on his left  lower leg and left plantar foot over the first metatarsal head History of Present Illness (HPI) 10/11/17; Mr. Ursin is a 72 year old man who tells me that in 2015 he slipped down the latter traumatizing his left  leg. He developed a wound in the same spot the area that we are currently looking at. He states this closed over for the most part although he always felt it was somewhat unstable. In 2016 he hit the same area with the door of his car had this reopened. He tells me that this is never really closed although sometimes an inflow it remains open on a constant basis. He has not been using any specific dressing to this except for topical antibiotics the nature of which were not really sure. His primary doctor did send him to see Dr. Jacinto Halim of interventional cardiology. He underwent an angiogram on 08/06/17 and he underwent a PTA and directional atherectomy of the lesser distal SFA and popliteal arteries which resulted in brisk improvement in blood flow. It was noted that he had 2 vessel runoff through the anterior tibial and peroneal. He is also been to see vascular and interventional radiologist. He was not felt to have any significant superficial venous insufficiency. Presumably is not a candidate for any ablation. It was suggested he come here for wound care. The patient is a type II diabetic on insulin. He also has a history of venous insufficiency. ABIs on the left were noncompressible in our clinic 10/21/17; patient we admitted to the clinic last week. 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 We put Iodosorb on him after an aggressive debridement and 3 layer compression. He complained of pain in his ankle and itching with is skin in fact he scratched the area on the medial calf superiorly at the rim of our wraps and he has 2 small open areas in that location today which are new. I changed his primary dressing today to silver collagen. As noted he is already had revascularization and does not have any significant superficial venous insufficiency that would be amenable to ablation 10/28/17; patient admitted to the clinic 2 weeks ago. He has  a smaller Wound. Scratch injury from last week revealed. There is large wound over the tibial area. This is smaller. Granulation looks healthy. No need for debridement. PHILIPE, ROUGHT ([id number]) [id number] Page 11 of 20 11/04/17; the wound on the left lateral calf looks better. Improved dimensions. Surface of this looks better. We've been maintaining him and Kerlix Coban wraps. He finds this much more comfortable. Silver collagen dressing 11/11/17; left lateral Wound continues to look healthy be making progress. Using a #5 curet I removed removed nonviable skin from the surface of the wound and then necrotic debris from the wound surface. Surface of the wound continues to look healthy. ooHe also has an open area on the left great toenail bed. We've been using topical antibiotics. 11/19/17; left anterior lateral wound continues to look healthy but it's not closed. ooHe also had a small wound above this on the left leg ooInitially traumatic wounds in the setting of significant chronic venous insufficiency and stasis dermatitis 11/25/17; left anterior wounds superiorly is closed still a small wound inferiorly. 12/02/17; left anterior tibial area. Arrives today with adherent callus. Post debridement clearly not completely closed. Hydrofera Blue under 3 layer compression. 12/09/17; left anterior tibia. Circumferential eschar however the wound bed looks stable to improved. We've been using Hydrofera Blue  under 3 layer compression 12/17/17; left anterior tibia. Apparently this was felt to be closed however when the wrap was taken off there is a skin tear to reopen wounds in the same area we've been using Hydrofera Blue under 3 layer compression 12/23/17 left anterior tibia. Not close to close this week apparently the [facility] was stuck to this again. Still circumferential eschar requiring debridement. I put a contact layer on this this time under the Hydrofera Blue 12/31/17;  left anterior tibia. Wound is better slight amount of hyper-granulation. Using Hydrofera Blue over Adaptic. 01/07/18; left anterior tibia. The wound had some surface eschar however after this was removed he has no open wound.he was already revascularized by Dr. Jacinto Halim when he came to our clinic with atherectomy of the left SFA and popliteal artery. He was also sent to interventional radiology for venous reflux studies. He was not felt to have significant reflux but certainly has chronic venous changes of his skin with hemosiderin deposition around this area. He will definitely need to lubricate his skin and wear compression stocking and I've talked to him about this. READMISSION 05/26/2018 This is a now 73 year old man we cared for with traumatic wounds on his left anterior lower extremity. He had been previously revascularized during that admission by Dr. Jacinto Halim. Apparently in follow-up Dr. Jacinto Halim noted that he had deterioration in his arterial status. He underwent a stent placement in the distal left SFA on 04/22/2018. 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 He went back to the angiography suite on 04/30/2018 he underwent PTA and balloon angioplasty of the occluded left mid anterior tibial artery, thrombotic occlusion went from 100 to 0% which reconstitutes the posterior tibial artery. He had thrombectomy and aspiration of the peroneal artery. The stent placed in the distal SFA left SFA was still occluded. 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 He has been recuperating at [facility] home in [facility] after the most recent hospitalization. He is going home tomorrow. He tells me that 2 weeks ago he traumatized the tip of his left  fifth toe. He came in urgently for our review of this. This was a history of before I noted that Dr. Jacinto Halim had already noted dry gangrenous changes of the left fifth toe 06/09/2018; 2-week follow-up. I did contact Dr. Jacinto Halim after his last appointment and he apparently saw 1 of Dr. Verl Dicker colleagues the next day. He does not follow-up with Dr. Jacinto Halim himself until Thursday of this week. 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 Nevertheless there is no evidence of infection no drainage and no pain. He had a new area that this week when we were signing him in today on the left anterior mid tibia area, this is in close proximity to the previous wound we have dealt with in this clinic. 06/23/2018; 2-week follow-up. I did not receive a recent note from Dr. Jacinto Halim to review today. Our office is trying to obtain this. He is apparently not planning to do further vascular interventions and wondered about compression to try and help with the patient's chronic venous insufficiency. However we are also concerned about the arterial flow. ooHe arrives in clinic today with a new area on  the left third toe. The areas on the calf/anterior tibia are close to closing. The left fifth toe is still mummified using Betadine. -In reviewing things with the patient he has what sounds like claudication with mild to moderate amount of activity. 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 I prescribed Levaquin over the phone while we attempted to arrange a plan of care. However the patient called yesterday to report he had low-grade fever and he came in today acutely. There is been a marked deterioration in the left third toe with spreading cellulitis up into the dorsal left foot. He was referred to the emergency room. Readmission: 06/29/2020 patient presents today for reevaluation here in our clinic he was previously treated by Dr. Leanord Hawking at the latter part of 2019 in 2 the  beginning of 2020. Subsequently we have not seen him since that time in the interim he did have evaluation with vein and vascular specialist specifically Dr. Bo Mcclintock who did perform quite extensive work for a left femoral to anterior tibial artery bypass. With that being said in the interim the patient has developed significant lymphedema and has wounds that he tells me have really never healed in regard to the incision site on the left leg. He also has multiple wounds on the feet for various reasons some of which is that he tends to pick at his feet. Fortunately there is no signs of active infection systemically at this time he does have some wounds that are little bit deeper but most are fairly superficial he seems to have good blood flow and overall everything appears to be healthy I see no bone exposed and no obvious signs of osteomyelitis. I do not know that he necessarily needs a x-ray at this point although that something we could consider depending on how things progress. The patient does have a history of lymphedema, diabetes, this is type II, chronic kidney disease stage III, hypertension, and history of peripheral vascular disease. 07/05/2020; patient admitted last week. Is a patient I remember from 2019 he had a spreading infection involving the left foot and we sent him to the hospital. 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 He subsequently had a left femoral to anterior tibial bypass by Dr.Cain vein and vascular. He also has severe lymphedema with chronic skin changes related to that on the left leg. The most problematic area that was new today was on the left medial great toe. This was apparently a small area last week there was purulent drainage which our intake nurse cultured. Also areas on the left medial foot and heel left lateral foot. He has 2 areas on the left medial calf left lateral calf in the setting of the severe  lymphedema. 07/13/2020 on evaluation today patient appears to be doing better in my opinion compared to his last visit. The good news is there is no signs of active infection systemically and locally I do not see any signs of infection either. He did have an x-ray which was negative that is great news he had a culture which showed MRSA but at the same time he is been on the doxycycline which has helped. I do think we may want to extend this for 7 additional days 1/25; patient admitted to the clinic a few weeks ago. 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 We have been putting him under compression his  edema control is a lot better but he is severe verricused skin on the left leg. He is really done quite well he still has an open area on the left medial calf and the left medial first metatarsal head. We have been using silver collagen on the leg silver alginate on the foot 07/27/2020 upon evaluation today patient appears to be doing decently well in regard to his wounds. He still has a lot of dry skin on the left leg. Some of this is starting to peel back and I think he may be able to have them out by removing some that today. Fortunately there is no signs of active infection at this time on the left leg although on the right leg he does appear to have swelling and erythema as well as some mild warmth to touch. This does have been concerned about the possibility of cellulitis although within the differential diagnosis I do think that potentially a DVT has to be at least considered. We need to rule that out before proceeding would just call in the cellulitis. Especially since he is having pain in the posterior aspect of his calf muscle. 2/8; the patient had seen sparingly. He has severe skin changes of chronic lymphedema in the left leg thickened hyperkeratotic verrucous skin. He has an open wound on the medial part of the left first met head left mid tibia. He  also has a rim of nonepithelialized skin in the anterior mid tibia. He brought in the AmLactin lotion that was been prescribed although I am not sure under compression and its utility. There concern about cellulitis on the right lower leg the last time he was here. He was put on on antibiotics. His DVT rule out was negative. The right leg looks fine he is using his stocking on this area 08/10/2020 upon evaluation today patient appears to be doing well with regard to his leg currently. He has been tolerating the dressing changes without complication. Fortunately there is no signs of active infection which is great news. Overall very pleased with where things stand. 2/22; the patient still has an area on the medial part of the left first met his head. This looks better than when I last saw this earlier this month he has a rim of [name], [name] ([id number]) [id number] Page 12 of 20 epithelialization but still some surface debris. Mostly everything on the left leg is healed. There is still a vulnerable in the left mid tibia area. 08/30/2020 upon evaluation today patient appears to be doing much better in regard to his wounds on his foot. 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 Nonetheless I think we will address that today I Minna send in a prescription for him in that regard. Overall though there does not appear to be any signs of significant worsening. 09/07/2020 on evaluation today patient's wounds over his left foot appear to be doing excellent. I do not see any signs of infection there is some callus buildup this can require debridement for certain but overall I feel like he is managing quite nicely. He still using the AmLactin cream which has been beneficial for him as well. 3/22; left foot wound is closed. There is no open area here. He  is using ammonium lactate lotion to the lower extremities to help exfoliate dry cracked skin. He has compression stockings from [facility] in [place]. The wound on the medial part of his left first met head is healed today. READMISSION 04/12/2021 Mr. Ledwell is a patient we know fairly well he had a prolonged stay in clinic in 2019 with wounds on his left lateral and left anterior lower extremity in the setting of chronic venous insufficiency. More recently he was here earlier this year with predominantly an area on his left foot first metatarsal head plantar and he says the plantar foot broke down on its not long after we discharged him but he did not come back here. The last few months areas of broken down on his left anterior and again the left lateral lower extremity. The leg itself is very swollen chronically enlarged a lot of hyperkeratotic dry Berry Q skin in the left lower leg. His edema extends well into the thigh. He was seen by Dr. Randie Heinz. He had ABIs on 03/02/2021 showing an ABI on the right of 1 with a TBI of 0.72 his ABI in the left at 1.09 TBI of 0.99. Monophasic and biphasic waveforms on the right. On the left monophasic waveforms were noted he went on to have an angiogram on 03/27/2021 this showed the aortic aortic and iliac segments were free of flow-limiting stenosis the left common femoral vein to evaluate the left femoral to anterior tibial artery bypass was unobstructed the bypass was patent without any areas of stenosis. We discharged the patient in bilateral juxta lite stockings but very clearly that was not sufficient to control the swelling and maintain skin integrity. He is clearly going to need compression pumps. 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 This is fortunate because he is on his feet for long periods of time. 10/27; patient comes in with our intake nurse reporting copious amount of green drainage from the  left anterior mid tibia the left dorsal foot and to a lesser extent the left medial mid tibia. We left the compression wrap on all week for the amount of edema in his left leg is quite a bit better. We use silver alginate as the primary dressing 11/3; edema control is good. Left anterior lower leg left medial lower leg and the plantar first metatarsal head. The left anterior lower leg required debridement. Deep tissue culture I did of this wound showed MRSA I put him on 10 days of doxycycline which she will start today. We have him in compression wraps. He has a security card and [employer] however he is retiring on November 15. We will need to then get him into a better offloading boot for the left foot perhaps a total contact cast 11/10; edema control is quite good. Left anterior and left medial lower leg wounds in the setting of chronic venous insufficiency and lymphedema. He also has a substantial area over the left plantar first metatarsal head. 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 He has significant hypergranulation on the left plantar foot wound. The patient is a diabetic but he does not have significant PAD 11/17; edema control is quite good. Left anterior and left medial lower leg wounds look better. The really concerning area remains the area on the left plantar first metatarsal head. He has a rim of epithelialization. He has been using a surgical shoe The patient is now retired from a a [employer] I have  gone over with him the need to offload this area aggressively. Starting today with a forefoot off loader but . possibly a total contact cast. 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 Arrives with a very significant odor from I think all of the wounds on the left leg and the left  foot. Because of this I did not put a total contact cast on him today but will could still consider this. His wife was having cataract surgery which is the reason he missed the appointment 12/6. I saw this man 5 days ago with a swelling below the popliteal fossa. I thought he actually might have a Baker's cyst however the DVT rule out study that we could arrange right away was negative the technician told me this was not a ruptured Baker's cyst. We attempted to get this aspirated by under ultrasound guidance in interventional radiology however all they did was an ultrasound however it shows an extensive fluid collection 62 x 8 x 9.4 in the left thigh and left calf. The patient states he thinks this started 8 days ago or so but he really is not complaining of any pain, fever or systemic symptoms. He has not ha 12/20; after some difficulty I managed to get the patient into see Dr. Randie Heinz. Eventually he was taken into the hospital and had a drain put in the fluid collection below his left knee posteriorly extending into the posterior thigh. He still has the drain in place. Culture of this showed moderate staff aureus few Morganella and few Klebsiella he is now on doxycycline and ciprofloxacin as suggested by infectious disease he is on this for a month. The drain will remain in place until it stops draining 12/29; he comes in today with the 1 wound on his left leg and the area on the left plantar first met head significantly smaller. Both look healthy. He still has the drain in the left leg. He says he has to change this daily. Follows up with Dr. Randie Heinz on January 11. 06/29/2021; the wounds that I am following on the left leg and left first met head continued to be quite healthy. However the area where his inferior drain is in place had copious amounts of drainage which was green in color. The wound here is larger. Follows up with Dr. Pascal Lux of vein and vascular his surgeon next week as well as infectious  disease. He remains on ciprofloxacin and doxycycline. He is not complaining of excessive pain in either one of the drain areas 1/12; the patient saw vascular surgery and infectious disease. Vascular surgery has left the drain in place as there was still some notable drainage still see him back in 2 weeks. Dr. Dorthula Perfect stop the doxycycline and ciprofloxacin and I do not believe he follows up with them at this point. Culture I did last week showed both doxycycline resistant MRSA and Pseudomonas not sensitive to ciprofloxacin although only in rare titers 1/19; the patient's wound on the left anterior lower leg is just about healed. We have continued healing of the area that was medially on the left leg. Left first plantar metatarsal head continues to get smaller. The major problem here is his 2 drain sites 1 on the left upper calf and lateral thigh. There is purulent drainage still from the left lateral thigh. I gave him antibiotics last week but  we still have recultured. He has the drain in the area I think this is eventually going to have to come out. I suspect there will be a connecting wound to heal here perhaps with improved VAc 1/26; the patient had his drain removed by vein and vascular on 1/25/. This was a large pocket of fluid in his left thigh that seem to tunnel into his left upper calf. 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 His mention his Penrose drain was removed today. He now has a tunneling wound on his left calf and left thigh. Both of these probe widely towards each other although I cannot really prove that they connect. Both wounds on his lower leg anteriorly are closed and his area over the first metatarsal head on his right foot continues to improve. We are using Hydrofera Blue here. He also saw infectious disease culture of the abscess they noted was polymicrobial with MRSA, Morganella and Klebsiella he was treated with doxycycline and ciprofloxacin for 4 weeks  ending on 07/03/2021. They did not recommend any further antibiotics. Notable that while he still had the Penrose drain in place last week he had purulent drainage coming out of the inferior IandD site this grew [facility], MRSA and Pseudomonas but there does not appear to be any active infection in this area today with the drain out and he is not systemically unwell 2/2; with regards to the drain sites the superior one on the thigh actually is closed down the one on the upper left lateral calf measures about 8 and half [name], [name] ([id number]) [id number] Page 13 of 20 centimeters which is an improvement seems to be less prominent although still with a lot of drainage. The only remaining wound is over the first metatarsal head on the left foot and this looks to be continuing to improve with Hydrofera Blue. 2/9; the area on his plantar left foot continues to contract. Callus around the wound edge. The drain sites specifically have not come down in depth. We put the wound VAC on Monday he changed the canister late last night our intake nurse reported a pocket of fluid perhaps caused by our compression wraps 2/16; continued improvement in left foot plantar wound. drainage site in the calf is not improved in terms of depth (wound vac) 2/23; continued improvement in the left foot wound over the first metatarsal head. With regards to the drain sites the area on his thigh laterally is healed however the open area on his calf is small in terms of circumference by still probes in by about 15 cm. Within using the wound VAC. Hydrofera Blue on his foot 08/24/2021: The left first metatarsal head wound continues to improve. The wound bed is healthy with just some surrounding callus. Unfortunately the open drain site on his calf remains open and tunnels at least 15 cm (the extent of a Q-tip). This is despite several weeks of wound VAC treatment. Based on reading back through the  notes, there has been really no significant change in the depth of the wound, although the orifice is smaller and the more cranial wound on his thigh has closed. I suspect the tunnel tracks nearly all the way to this location. 08/31/2021: Continued improvement in the left first metatarsal head wound. There has been absolutely no improvement to the long tunnel from his open drain site on his calf. We have tried to get him into see vascular surgery sooner to consider the possibility of simply  filleting the tract open and allowing it to heal from the bottom up, likely with a wound VAC. They have not yet scheduled a sooner appointment than his current mid April 09/14/2021: He was seen by vascular surgery and they took him to the operating room last week. They opened a portion of the tunnel, but did not extend the entire length of the known open subcutaneous tract. I read Dr. Darcella Cheshire operative note and it is not clear from that documentation why only a portion of the tract was opened. The heaped up granulation tissue was curetted and removed from at least some portion of the tract. They did place a wound VAC and applied an Unna boot to the leg. The ulcer on his left first metatarsal head is smaller today. The bed looks good and there is just a small amount of surrounding callus. 09/21/2021: The ulcer on his left first metatarsal head looks to be stalled. There is some callus surrounding the wound but the wound bed itself does not appear particularly dynamic. The tunnel tract on his lateral left leg seems to be roughly the same length or perhaps slightly smaller but the wound bed appears healthy with good granulation tissue. He opened up a new wound on his medial thigh and the site of a prior surgical incision. He says that he did this unconsciously in his sleep by scratching. 09/28/2021: Unfortunately, the ulcer on his left first metatarsal head has extended underneath the callus toward the dorsum of his foot. The  medial thigh wounds are roughly the same. The tunnel on his lateral left leg continues to be problematic; it is longer than we are able to actually probe with a Q-tip. I am still not certain as to why Dr. Randie Heinz did not open this up entirely when he took the patient to the operating room. We will likely be back in the same situation with just a small superficial opening in a long unhealed tract, as the open portion is granulating in nicely. 10/02/2021: The patient was initially scheduled for a nurse visit, but we are also applying a total contact cast today. The plantar foot wound looks clean without significant accumulated callus. We have been applying Prisma silver collagen to the site. 10/05/2021: The patient is here for his first total contact cast change. We have tried using gauze packing strips in the tunnel on his lateral leg wound, but this does not seem to be working any better than the white VAC foam. The foot ulcer looks about the same with minimal periwound callus. Medial thigh wound is clean with just some overlying eschar. 10/12/2021: The plantar foot wound is stable without any significant accumulation of periwound callus. The surface is viable with good granulation tissue. The medial thigh wounds are much smaller and are epithelializing. On the other hand, he had purulent drainage coming from the tunnel on his lateral leg. He does go back to see Dr. Randie Heinz next week and is planning to ask him why the wound tunnel was not completely opened at the time of his most recent operation. 10/19/2021: The plantar foot wound is markedly improved and has epithelial tissue coming through the surface. The medial thigh wounds are nearly closed with just a tiny open area. He did see Dr. Randie Heinz earlier this week and apparently they did discuss the possibility of opening the sinus tract further and enabling a wound VAC application. Apparently there are some limits as to what Dr. Randie Heinz feels comfortable opening,  presumably in relationship to  his bypass graft. I think if we could get the tract open to the level of the popliteal fossa, this would greatly aid in her ability to get this chart closed. That being said, however, today when I probed the tract with a Q-tip, I was not able to insert the entirety of the Q-tip as I have on previous occasions. The tunnel is shorter by about 4 cm. The surface is clean with good granulation tissue and no further episodes of purulent drainage. 10/30/2021: Last week, the patient underwent surgery and had the long tract in his leg opened. There was a rind that was debrided, according to the operative report. His medial thigh ulcers are closed. The plantar foot wound is clean with a good surface and some built up surrounding callus. 11/06/2021: The overall dimensions of the large wound on his lateral leg remain about the same, but there is good granulation tissue present and the tunneling is a little bit shorter. He has a new wound on his anterior tibial surface, in the same location where he had a similar lesion in the past. The plantar foot wound is clean with some buildup surrounding callus. Just toward the medial aspect of his foot, however, there is an area of darkening that once debrided, revealed another opening in the skin surface. 11/13/2021: The anterior tibial surface wound is closed. The plantar foot wound has some surrounding callus buildup. The area of darkening that I debrided last week and revealed an opening in the skin surface has closed again. The tunnel in the large wound on his lateral leg has come in by about 3 cm. There is healthy granulation tissue on the entire wound surface. 11/23/2021: The patient was out of town last week and did wet-to-dry dressings on his large wound. 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 Unfortunately, he picked open the wound on his left medial thigh. He says that  it was itching and he just could not stop scratching it until it was open again. The wound on his plantar foot is smaller and has not accumulated a tremendous amount of callus. The lateral leg wound is shallower and the tunnel has also decreased in depth. There is just a little bit of slough accumulation on the surface. 11/30/2021: Another portion of his left medial thigh has been opened up. All of these wounds are fairly superficial with just a little bit of slough and eschar accumulation. The wound on his plantar foot is almost closed with just a bit of eschar and periwound callus accumulation. The lateral leg wound is nearly flush with the surrounding skin and the tunnel is markedly shallower. 12/07/2021: There is just 1 open area on his left medial thigh. It is clean with just a little bit of perimeter eschar. The wound on his plantar foot continues to contract and just has some eschar and periwound callus accumulation. The lateral leg wound is closing at the more distal aspect and the tunnel is smaller. The surface is nearly flush with the surrounding skin and it has a good bed of granulation tissue. 12/14/2021: The thigh and foot wounds are closed. The lateral leg wound has closed over approximately half of its length. The tunnel continues to contract and the surface is now flush with the surrounding skin. The wound bed has robust granulation tissue. 12/22/2021: The thigh and foot wounds have reopened. The foot wound has a lot of callus accumulation around and over  it. The thigh wound is tiny with just a little bit of slough in the wound bed. The lateral leg wound continues to contract. His vascular surgeon took the wound VAC off earlier in the week and the patient has been doing wet-to-dry dressings. There is a little slough accumulation on the surface. The tunnel is about 3 cm in depth at this point. 12/28/2021: The thigh wound is closed again. The foot wound has some callus that subsequently has  peeled back exposing just a small slit of a wound. The lateral leg wound Is down to about half the size that it originally was and the tunnel is down to about half a centimeter in depth. 01/04/2022: The thigh wound remains closed. The foot wound has heavy callus overlying the wound site. Once this was debrided, the wound was found to be closed. The lateral leg wound is smaller again this week and very superficial. No tunnel could be identified. ALEXANDRA, MOSQUEDA ([id number]) [id number] Page 14 of 20 01/12/2022: The thigh and foot wounds both remain closed. The lateral leg wound is now nearly flush with the skin surface. There is good granulation tissue present with a light layer of slough. 01/19/2022: Due to the way his wrap was placed, the patient did not change the dressing on his thigh at all and so the foam was saturated and his skin is macerated. There is a light layer of slough on the wound surface. The underlying granulation tissue is robust and healthy-appearing. He has heavy callus buildup at the site of his first metatarsal head wound which is still healed. 02/01/2022: He has been in silver alginate. When he removed the dressing from his thigh wound, however, some leg, superficially reopening a portion of the wound that had healed. In addition, underneath the callus at his left first metatarsal head, there appears to be a blister and the wound appears to be open again. 02/08/2022: The lateral leg wound has contracted substantially. There is eschar and a light layer of slough present. He says that it is starting to pull and is uncomfortable. On inspection, there is some puckering of the scar and the eschar is quite dry; this may account for his symptoms. On his first metatarsal head, the wound is much smaller with just some eschar on the surface. The callus has not reaccumulated. He reports that he had a blister come up on his medial thigh wound at the distal aspect. It  popped and there is now an opening in his skin again. Looking back through his library of wound photos, there is what looks like a permanent suture just deep to this location and it may be trying to erode through. We have been using silver alginate on his wounds. 02/15/2022: The lateral leg wound is about half the size it was last week. It is clean with just a little perimeter eschar and light slough. The wound on his first metatarsal head is about the same with heavy callus overlying it. The medial thigh wound is closed again. He does have some skin changes on the top of his foot that looks potentially yeast related. 02/22/2022: The skin on the top of his foot improved with the use of a topical antifungal. The lateral leg wound continues to contract and is again smaller this week. There is a little bit of slough and eschar on the surface. The first metatarsal head wound is a little bit smaller but has reaccumulated a thick callus over the top. He decided to try to  trim his toenail and ultimately took the entire nail off of his left great toe. 03/02/2022: His lateral leg wound continues to improve, as does the wound on his left great toe. Unfortunately, it appears that somehow his foot got wet and moisture seeped in through the opening causing his skin to lift. There is a large wound now overlying his first metatarsal on both the plantar, medial, and dorsal portion of his foot. There is necrotic tissue and slough present underneath the shaggy macerated skin. 03/08/2022: The lateral leg wound is smaller again today. There is just a light layer of slough and eschar on the surface. The great toe wound is smaller again today. The first metatarsal wound is a little bit smaller today and does not look nearly as necrotic and macerated. There is still slough and nonviable tissue present. 03/15/2022: The lateral leg wound is narrower and just has a little bit of light slough buildup. The first metatarsal wound still  has a fair amount of moisture affecting the periwound skin. The great toe wound is healed. 03/22/2022: The lateral leg wound is now isolated to just at the level of his knee. There is some eschar and slough accumulation. The first metatarsal head wound has epithelialized tremendously and is about half the size that it was last week. He still has some maceration on the top of his foot and a fungal odor is present. 03/29/2022: T oday the patient's foot was macerated, suggesting that the cast got wet. The patient has also been picking at his dry skin and has enlarged the wound on his left lateral leg. In the time between having his cast removed and my evaluation, he had picked more dry skin and opened up additional wounds on his Achilles area and dorsal foot. The plantar first metatarsal head wound, however, is smaller and clean with just macerated callus around the perimeter and light slough on the surface. The lateral leg wound measured a little bit larger but is also fairly clean with eschar and minimal slough. 04/02/2022: The patient had vascular studies done last Friday and so his cast was not applied. He is here today to have that done. 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 04/05/2022: Both wounds are smaller and quite clean. There is just a little biofilm on the lateral leg wound. 10/20; the patient has a wound on the left lateral surgical incision at the level of his lateral knee this looks clean and improved. He is using silver alginate. He also has an area on his left medial foot for which she is using Hydrofera Blue under a total contact cast both wounds are measuring smaller 04/20/2022: The plantar foot wound has contracted considerably and is very close to closing. The lateral leg wound was measured a little larger, but there was a tiny open area that was included in the measurements that was not included last week. He has some eschar around the perimeter but otherwise the  wound looks clean. 04/27/2022: The lateral leg wound looks better this week. He says that midweek, he felt it was very dry and began applying hydrogel to the site. I think this was beneficial. The foot wound is nearly closed underneath a thick layer of dry skin and callus. 05/04/2022: The foot wound is healed. He has developed a new small ulcer on his anterior tibial surface about midway up his leg. It has a little slough on the surface. The lateral leg wound still is fairly dry, but clean with just  a little biofilm on the surface. 05/11/2022: The wound on his foot reopened on Wednesday. A large blister formed which then broke open revealing the fat layer underneath. The ulcer on his anterior tibial surface is a little bit larger this week. The lateral leg wound has much better moisture balance this week. Fortunately, prior to his foot wound reopening, he did get the cast made for his orthotic. 05/15/2022: Already, the left medial foot wound has improved. The tissue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 Description: 14782 - DEBRIDE WOUND 1ST 20 SQ CM OR < ICD-10 Diagnosis Description 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 Modifier: verity Quantity: 1 Physician Procedures : CPT4 Code Description Modifier [id number] - WC PHYS LEVEL 3 - EST PT 25 ICD-10 Diagnosis Description 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.828 Non-pressure chronic ulcer of other part of left  lower leg with other specified severity 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

## 2022-11-01 ENCOUNTER — Encounter (HOSPITAL_BASED_OUTPATIENT_CLINIC_OR_DEPARTMENT_OTHER): Payer: Medicare Other | Admitting: General Surgery

## 2022-11-01 DIAGNOSIS — E1151 Type 2 diabetes mellitus with diabetic peripheral angiopathy without gangrene: Secondary | ICD-10-CM | POA: Diagnosis not present

## 2022-11-01 NOTE — Progress Notes (Signed)
[name], [name] ([id number]) [id number] Page 1 of 10 Visit Report for 11/01/2022 Arrival Information Details Patient Name: Date of Service: [name], [name] 11/01/2022 11:30 A M Medical Record Number: [id number] Patient Account Number: [id number] Date of Birth/Sex: Treating RN: [birth year] (72 y.o. [name] Primary Care Chicquita Mendel: [name] Other Clinician: Referring Karthikeya Funke: Treating Chiquita Heckert/Extender: [name] in Treatment: 33 Visit Information History Since Last Visit Added or deleted any medications: No Patient Arrived: Ambulatory Any new allergies or adverse reactions: No Arrival Time: 11:30 Had a fall or experienced change in No Accompanied By: self activities of daily living that may affect Transfer Assistance: None risk of falls: Patient Identification Verified: Yes Signs or symptoms of abuse/neglect since last visito No Secondary Verification Process Completed: Yes Hospitalized since last visit: No Patient Requires Transmission-Based Precautions: No Implantable device outside of the clinic excluding No Patient Has Alerts: Yes cellular tissue based products placed in the center since last visit: Has Dressing in Place as Prescribed: Yes Has Compression in Place as Prescribed: Yes Pain Present Now: No Electronic Signature(s) Signed: 11/01/2022 3:37:40 PM By: [name] Entered By: [name] on 11/01/2022 11:31:09 -------------------------------------------------------------------------------- Compression Therapy Details Patient Name: Date of Service: [name]. 11/01/2022 11:30 A M Medical Record Number: [id number] Patient Account Number: [id number] Date of Birth/Sex: Treating RN: [birth year] (72 y.o. [name] Primary Care [place] Gipe: [name] Other Clinician: Referring Keoki Mchargue: Treating Jahna Liebert/Extender: [name] in Treatment: 74 Compression  Therapy Performed for Wound Assessment: Wound #18R Left,Plantar Metatarsal head first Performed By: Clinician [name], RN Compression Type: Double Layer Post Procedure Diagnosis Same as Pre-procedure Electronic Signature(s) Signed: 11/01/2022 3:37:40 PM By: Gelene Mink By: [name] on 11/01/2022 13:16:35 -------------------------------------------------------------------------------- Encounter Discharge Information Details Patient Name: Date of Service: [name]. 11/01/2022 11:30 A M Medical Record Number: [id number] Patient Account Number: [id number] Date of Birth/Sex: Treating RN: [birth year] (72 y.o. [name] Primary Care Taiquan Campanaro: [name] Other Clinician: Referring Rollins Wrightson: Treating Ankith Edmonston/Extender: [name] in Treatment: 41 Encounter Discharge Information Items Post Procedure Vitals Discharge Condition: Stable Temperature (F): 98.4 Ambulatory Status: Ambulatory Pulse (bpm): 92 Discharge Destination: Home Respiratory Rate (breaths/min): 16 Transportation: Private Auto Blood Pressure (mmHg): 143/83 NURI, BILLEY ([id number]) [id number] Page 2 of 10 Accompanied By: self Schedule Follow-up Appointment: Yes Clinical Summary of Care: Patient Declined Electronic Signature(s) Signed: 11/01/2022 3:37:40 PM By: Gelene Mink By: [name] on 11/01/2022 13:19:55 -------------------------------------------------------------------------------- Lower Extremity Assessment Details Patient Name: Date of Service: [name], [name] 11/01/2022 11:30 A M Medical Record Number: [id number] Patient Account Number: [id number] Date of Birth/Sex: Treating RN: Apr 14, 1951 (72 y.o. [name] Primary Care Symphani Eckstrom: [name] Other Clinician: Referring Gean Larose: Treating Doreena Maulden/Extender: [name] in Treatment: 81 Edema  Assessment Assessed: [name]: No] [Right: No] Edema: [Left: Ye] [Right: s] Calf Left: Right: Point of Measurement: 41 cm From Medial Instep 46 cm Ankle Left: Right: Point of Measurement: 10 cm From Medial Instep 28.6 cm Vascular Assessment Pulses: Dorsalis Pedis Palpable: [Left:Yes] Electronic Signature(s) Signed: 11/01/2022 3:37:40 PM By: [name] Entered By: [name] on 11/01/2022 11:39:28 -------------------------------------------------------------------------------- Multi Wound Chart Details Patient Name: Date of Service: [name]. 11/01/2022 11:30 A M Medical Record Number: [id number] Patient Account Number: [id number] Date of Birth/Sex: Treating RN: [birth year] (72 y.o. M) Primary Care Cherise Fedder: [name] Other Clinician: Referring Markea Ruzich: Treating Darcel Zick/Extender: [name] in Treatment: [phone]  Vital Signs Height(in): 74 Pulse(bpm): 92 Weight(lbs): 238 Blood Pressure(mmHg): 143/83 Body Mass Index(BMI): 30.6 Temperature(F): 98.4 Respiratory Rate(breaths/min): 16 [18R:Photos:] [32:[id number] Page 3 of 10] Left, Plantar Metatarsal head first Left, Proximal, Lateral Lower Leg Left, Medial, Anterior Lower Leg Wound Location: Gradually Appeared Bump Blister Wounding Event: Diabetic Wound/Ulcer of the Lower Cyst Lesion Primary Etiology: Extremity Glaucoma, Sleep Apnea, Glaucoma, Sleep Apnea, Glaucoma, Sleep Apnea, Comorbid History: Hypertension, Peripheral ArterialHypertension, Peripheral Arterial Hypertension, Peripheral Arterial Disease, Peripheral Venous Disease, Disease, Peripheral Venous Disease, Disease, Peripheral Venous Disease, Type II Diabetes, Gout, Osteoarthritis, Type II Diabetes, Gout, Osteoarthritis, Type II Diabetes, Gout, Osteoarthritis, Neuropathy Neuropathy Neuropathy 08/23/2020 06/03/2021 10/18/2022 Date Acquired: 81 73 2 Weeks of Treatment: Open Open Open Wound  Status: Yes No No Wound Recurrence: No Yes No Clustered Wound: N/A 3 N/A Clustered Quantity: 0.5x1.9x0.2 1.2x0.6x0.1 0.2x0.5x0.1 Measurements L x W x D (cm) 0.746 0.565 0.079 A (cm) : rea 0.149 0.057 0.008 Volume (cm) : 94.70% 65.70% 92.50% % Reduction in A rea: 94.70% 95.70% 92.50% % Reduction in Volume: Grade 2 Full Thickness With Exposed Support Full Thickness Without Exposed Classification: Structures Support Structures Medium Medium Medium Exudate A mount: Serosanguineous Serosanguineous Serosanguineous Exudate Type: red, brown red, brown red, brown Exudate Color: Flat and Intact Fibrotic scar, thickened scar Flat and Intact Wound Margin: Large (67-100%) Large (67-100%) Large (67-100%) Granulation A mount: Red Red, Pink Red, Pink Granulation Quality: Small (1-33%) Small (1-33%) Small (1-33%) Necrotic A mount: Fat Layer (Subcutaneous Tissue): Yes Fat Layer (Subcutaneous Tissue): Yes Fat Layer (Subcutaneous Tissue): Yes Exposed Structures: Fascia: No Fascia: No Tendon: No Tendon: No Muscle: No Muscle: No Joint: No Joint: No Bone: No Bone: No Medium (34-66%) Small (1-33%) Medium (34-66%) Epithelialization: Debridement - Selective/Open Wound Debridement - Selective/Open Wound Debridement - Selective/Open Wound Debridement: Pre-procedure Verification/Time Out 11:46 11:46 11:46 Taken: Lidocaine 4% T opical Solution Lidocaine 4% Topical Solution Lidocaine 4% Topical Solution Pain Control: Callus, [employer], [employer] Tissue Debrided: Non-Viable Tissue Non-Viable Tissue Non-Viable Tissue Level: 0.75 0.57 0.08 Debridement A (sq cm): rea Curette Curette Curette Instrument: Minimum Minimum Minimum Bleeding: Pressure Pressure Pressure Hemostasis A chieved: Procedure was tolerated well Procedure was tolerated well Procedure was tolerated well Debridement Treatment Response: 0.5x1.9x0.2 1.2x0.6x0.1 0.2x0.5x0.1 Post Debridement  Measurements L x W x D (cm) 0.149 0.057 0.008 Post Debridement Volume: (cm) Callus: Yes Scarring: Yes Scarring: Yes Periwound Skin Texture: Dry/Scaly: Yes Dry/Scaly: Yes Dry/Scaly: Yes Periwound Skin Moisture: Maceration: No Maceration: No No Abnormalities Noted Hemosiderin Staining: Yes Hemosiderin Staining: Yes Periwound Skin Color: No Abnormality No Abnormality N/A Temperature: Debridement Debridement Debridement Procedures Performed: Treatment Notes Electronic Signature(s) Signed: 11/01/2022 12:08:28 PM By: [name] MD FACS Entered By: [name] on 11/01/2022 12:08:28 -------------------------------------------------------------------------------- Multi-Disciplinary Care Plan Details Patient Name: Date of Service: [name]. 11/01/2022 11:30 A M Medical Record Number: [id number] Patient Account Number: [id number] Date of Birth/Sex: Treating RN: [birth year] (72 y.o. [name] Primary Care Caci Orren: [name] Other Clinician: Referring Christabelle Hanzlik: Treating Alger Kerstein/Extender: [name] in Treatment: 78 Multidisciplinary Care Plan reviewed with physician [address] LEAH, DENNEY ([id number]) [id number] Page 4 of 10 Venous Leg Ulcer Nursing Diagnoses: Knowledge deficit related to disease process and management Potential for venous Insuffiency (use before diagnosis confirmed) Goals: Patient will maintain optimal edema control Date Initiated: 07/27/2021 Target Resolution Date: 02/23/2023 Goal Status: Active Interventions: Assess peripheral edema status every visit. Treatment Activities: Therapeutic compression applied : 07/27/2021 Notes: Wound/Skin Impairment Nursing Diagnoses: Impaired tissue integrity Knowledge deficit related  to ulceration/compromised skin integrity Goals: Patient will have a decrease in wound volume by X% from date: (specify in notes) Date Initiated:  04/12/2021 Date Inactivated: 01/04/2022 Target Resolution Date: 04/23/2021 Goal Status: Met Patient/caregiver will verbalize understanding of skin care regimen Date Initiated: 01/04/2022 Target Resolution Date: 02/23/2023 Goal Status: Active Ulcer/skin breakdown will have a volume reduction of 30% by week 4 Date Initiated: 04/12/2021 Date Inactivated: 04/27/2021 Target Resolution Date: 04/27/2021 Goal Status: Unmet Unmet Reason: infection Ulcer/skin breakdown will have a volume reduction of 50% by week 8 Date Initiated: 04/27/2021 Date Inactivated: 06/29/2021 Target Resolution Date: 06/24/2021 Goal Status: Met Interventions: Assess patient/caregiver ability to obtain necessary supplies Assess patient/caregiver ability to perform ulcer/skin care regimen upon admission and as needed Assess ulceration(s) every visit Notes: Electronic Signature(s) Signed: 11/01/2022 3:37:40 PM By: [name] Entered By: [name] on 11/01/2022 13:16:48 -------------------------------------------------------------------------------- Pain Assessment Details Patient Name: Date of Service: [name]. 11/01/2022 11:30 A M Medical Record Number: [id number] Patient Account Number: [id number] Date of Birth/Sex: Treating RN: [birth year] (72 y.o. [name] Primary Care [place] Huberty: [name] Other Clinician: Referring Ashleymarie Granderson: Treating Haydyn Liddell/Extender: [name] in Treatment: 7 Active Problems Location of Pain Severity and Description of Pain Patient Has Paino No Site Locations Rate the pain. DELAN, CANJURA ([id number]) [id number] Page 5 of 10 Rate the pain. Current Pain Level: 0 Pain Management and Medication Current Pain Management: Electronic Signature(s) Signed: 11/01/2022 3:37:40 PM By: [name] Entered By: [name] on 11/01/2022  11:31:19 -------------------------------------------------------------------------------- Patient/Caregiver Education Details Patient Name: Date of Service: [name] 5/9/2024andnbsp11:30 A M Medical Record Number: [id number] Patient Account Number: [id number] Date of Birth/Gender: Treating RN: [birth year] (72 y.o. [name] Primary Care Physician: [name] Other Clinician: Referring Physician: Treating Physician/Extender: [name] in Treatment: 68 Education Assessment Education Provided To: Patient Education Topics Provided Wound/Skin Impairment: Methods: Explain/Verbal Responses: Reinforcements needed, State content correctly Electronic Signature(s) Signed: 11/01/2022 3:37:40 PM By: [name] Entered By: [name] on 11/01/2022 13:17:01 -------------------------------------------------------------------------------- Wound Assessment Details Patient Name: Date of Service: [name]. 11/01/2022 11:30 A M Medical Record Number: [id number] Patient Account Number: [id number] Date of Birth/Sex: Treating RN: [birth year] (72 y.o. [name] Primary Care Leslieann Whisman: [name] Other Clinician: Referring Tramel Westbrook: Treating Solace Wendorff/Extender: [name] in Treatment: 81 Wound Status Wound Number: 18R Primary Diabetic Wound/Ulcer of the Lower Extremity Etiology: Wound Location: Left, Plantar Metatarsal head first Wound Open Wounding Event: Gradually Appeared Status: Date Acquired: 08/23/2020 Comorbid Glaucoma, Sleep Apnea, Hypertension, Peripheral Arterial Disease, Weeks Of Treatment: 81 History: Peripheral Venous Disease, Type II Diabetes, Gout, Osteoarthritis, Clustered Wound: No Neuropathy NAOD, RUANE ([id number]) [id number] Page 6 of 10 Photos Wound Measurements Length: (cm) 0.5 Width: (cm) 1.9 Depth: (cm) 0.2 Area: (cm) 0.746 Volume: (cm)  0.149 % Reduction in Area: 94.7% % Reduction in Volume: 94.7% Epithelialization: Medium (34-66%) Tunneling: No Undermining: No Wound Description Classification: Grade 2 Wound Margin: Flat and Intact Exudate Amount: Medium Exudate Type: Serosanguineous Exudate Color: red, brown Foul Odor After Cleansing: No Slough/Fibrino Yes Wound Bed Granulation Amount: Large (67-100%) Exposed Structure Granulation Quality: Red Fascia Exposed: No Necrotic Amount: Small (1-33%) Fat Layer (Subcutaneous Tissue) Exposed: Yes Necrotic Quality: Adherent Slough Tendon Exposed: No Muscle Exposed: No Joint Exposed: No Bone Exposed: No Periwound Skin Texture Texture Color No Abnormalities Noted: No No Abnormalities Noted: Yes Callus: Yes Temperature / Pain Temperature: No Abnormality Moisture No Abnormalities Noted: No Dry /  Scaly: Yes Maceration: No Treatment Notes Wound #18R (Metatarsal head first) Wound Laterality: Plantar, Left Cleanser Soap and Water Discharge Instruction: May shower and wash wound with dial antibacterial soap and water prior to dressing change. Wound Cleanser Discharge Instruction: Cleanse the wound with wound cleanser prior to applying a clean dressing using gauze sponges, not tissue or cotton balls. Peri-Wound Care Ketoconazole Cream 2% Discharge Instruction: Apply Ketoconazole as directed Topical Primary Dressing Sorbalgon AG Dressing 2x2 (in/in) Discharge Instruction: Apply to wound bed as instructed Secondary Dressing Optifoam Non-Adhesive Dressing, 4x4 in Discharge Instruction: Apply over primary dressing as directed. Zetuvit Plus 4x8 in Discharge Instruction: Apply over primary dressing as directed. WINFERD, COLGIN ([id number]) [id number] Page 7 of 10 Secured With Compression Wrap CoFlex [employer], 4 x 6 (in/yd) Discharge Instruction: Apply Coflex [employer]inc [employer] as directed. Compression Stockings Add-Ons Electronic  Signature(s) Signed: 11/01/2022 3:37:40 PM By: [name] Entered By: [name] on 11/01/2022 11:42:24 -------------------------------------------------------------------------------- Wound Assessment Details Patient Name: Date of Service: [name]. 11/01/2022 11:30 A M Medical Record Number: [id number] Patient Account Number: [id number] Date of Birth/Sex: Treating RN: 13-Jan-1951 (72 y.o. [name] Primary Care Anett Ranker: [name] Other Clinician: Referring Rorik Vespa: Treating Makinzie Considine/Extender: [name] in Treatment: 60 Wound Status Wound Number: 22 Primary Cyst Etiology: Wound Location: Left, Proximal, Lateral Lower Leg Wound Open Wounding Event: Bump Status: Date Acquired: 06/03/2021 Comorbid Glaucoma, Sleep Apnea, Hypertension, Peripheral Arterial Disease, Weeks Of Treatment: 73 History: Peripheral Venous Disease, Type II Diabetes, Gout, Osteoarthritis, Clustered Wound: Yes Neuropathy Photos Wound Measurements Length: (cm) Width: (cm) Depth: (cm) Clustered Quantity: Area: (cm) Volume: (cm) 1.2 % Reduction in Area: 65.7% 0.6 % Reduction in Volume: 95.7% 0.1 Epithelialization: Small (1-33%) 3 Tunneling: No 0.565 Undermining: No 0.057 Wound Description Classification: Full Thickness With Exposed Suppo Wound Margin: Fibrotic scar, thickened scar Exudate Amount: Medium Exudate Type: Serosanguineous Exudate Color: red, brown rt Structures Foul Odor After Cleansing: No Slough/Fibrino Yes Wound Bed Granulation Amount: Large (67-100%) Exposed Structure Granulation Quality: Red, Pink Fascia Exposed: No Necrotic Amount: Small (1-33%) Fat Layer (Subcutaneous Tissue) Exposed: Yes Necrotic Quality: Adherent Slough Tendon Exposed: No Muscle Exposed: No Joint Exposed: No Bone Exposed: No [address] JOSELITO, CHOUDRY ([id number]) [id number] Page 8 of 10 Texture Color No  Abnormalities Noted: No No Abnormalities Noted: No Scarring: Yes Hemosiderin Staining: Yes Moisture Temperature / Pain No Abnormalities Noted: No Temperature: No Abnormality Dry / Scaly: Yes Treatment Notes Wound #22 (Lower Leg) Wound Laterality: Left, Lateral, Proximal Cleanser Soap and Water Discharge Instruction: May shower and wash wound with dial antibacterial soap and water prior to dressing change. Wound Cleanser Discharge Instruction: Cleanse the wound with wound cleanser prior to applying a clean dressing using gauze sponges, not tissue or cotton balls. Peri-Wound Care Sween Lotion (Moisturizing lotion) Discharge Instruction: Apply moisturizing lotion to the leg Topical Skintegrity Hydrogel 4 (oz) Discharge Instruction: Apply hydrogel as directed Primary Dressing Endoform 2x2 in Discharge Instruction: Moisten with Hydrogel or saline Secondary Dressing ABD Pad, 8x10 Discharge Instruction: Apply over primary dressing as directed. Secured With Elastic Bandage 4 inch (ACE bandage) Discharge Instruction: Secure with ACE bandage as directed. Compression Wrap Compression Stockings Add-Ons Electronic Signature(s) Signed: 11/01/2022 3:37:40 PM By: [name] Entered By: [name] on 11/01/2022 11:42:53 -------------------------------------------------------------------------------- Wound Assessment Details Patient Name: Date of Service: STEPEHN, MCMANIGAL 11/01/2022 11:30 A M Medical Record Number: [id number] Patient Account Number: [id number] Date of Birth/Sex: Treating RN: July 30, 1950 (72 y.o. M)  [name] Primary Care Lavern Maslow: [name] Other Clinician: Referring Obelia Bonello: Treating Bryson Palen/Extender: [name] in Treatment: 77 Wound Status Wound Number: 32 Primary Lesion Etiology: Wound Location: Left, Medial, Anterior Lower Leg Wound Open Wounding Event: Blister Status: Date Acquired: 10/18/2022 Comorbid  Glaucoma, Sleep Apnea, Hypertension, Peripheral Arterial Disease, Weeks Of Treatment: 2 History: Peripheral Venous Disease, Type II Diabetes, Gout, Osteoarthritis, Clustered Wound: No Neuropathy Photos DAIAN, SCHLEGEL ([id number]) [id number] Page 9 of 10 Wound Measurements Length: (cm) 0.2 Width: (cm) 0.5 Depth: (cm) 0.1 Area: (cm) 0.079 Volume: (cm) 0.008 % Reduction in Area: 92.5% % Reduction in Volume: 92.5% Epithelialization: Medium (34-66%) Tunneling: No Undermining: No Wound Description Classification: Full Thickness Without Exposed Support Structures Wound Margin: Flat and Intact Exudate Amount: Medium Exudate Type: Serosanguineous Exudate Color: red, brown Foul Odor After Cleansing: No Slough/Fibrino Yes Wound Bed Granulation Amount: Large (67-100%) Exposed Structure Granulation Quality: Red, Pink Fat Layer (Subcutaneous Tissue) Exposed: Yes Necrotic Amount: Small (1-33%) Necrotic Quality: Adherent Slough Periwound Skin Texture Texture Color No Abnormalities Noted: No No Abnormalities Noted: No Scarring: Yes Hemosiderin Staining: Yes Moisture No Abnormalities Noted: No Dry / Scaly: Yes Maceration: No Treatment Notes Wound #32 (Lower Leg) Wound Laterality: Left, Medial, Anterior Cleanser Soap and Water Discharge Instruction: May shower and wash wound with dial antibacterial soap and water prior to dressing change. Wound Cleanser Discharge Instruction: Cleanse the wound with wound cleanser prior to applying a clean dressing using gauze sponges, not tissue or cotton balls. Peri-Wound Care Topical Primary Dressing Maxorb Extra Ag+ Alginate Dressing, 2x2 (in/in) Discharge Instruction: Apply to wound bed as instructed Secondary Dressing Woven Gauze Sponge, Non-Sterile 4x4 in Discharge Instruction: Apply over primary dressing as directed. Secured With Compression Wrap CoFlex Zinc [employer], 4 x 6 (in/yd) Discharge Instruction:  Apply Coflex [employer]inc [employer] as directed. Compression Stockings Add-Ons AARIS, YACKEL ([id number]) [id number].pdf Page 10 of 10 Electronic Signature(s) Signed: 11/01/2022 3:37:40 PM By: Gelene Mink By: [name] on 11/01/2022 11:43:19 -------------------------------------------------------------------------------- Vitals Details Patient Name: Date of Service: [name]. 11/01/2022 11:30 A M Medical Record Number: [id number] Patient Account Number: [id number] Date of Birth/Sex: Treating RN: 04-16-1951 (72 y.o. [name] Primary Care Keonda Dow: [name] Other Clinician: Referring Artie Takayama: Treating Vedanth Sirico/Extender: [name] in Treatment: 30 Vital Signs Time Taken: 11:33 Temperature (F): 98.4 Height (in): 74 Pulse (bpm): 92 Weight (lbs): 238 Respiratory Rate (breaths/min): 16 Body Mass Index (BMI): 30.6 Blood Pressure (mmHg): 143/83 Reference Range: 80 - 120 mg / dl Electronic Signature(s) Signed: 11/01/2022 3:37:40 PM By: [name] Entered By: [name] on 11/01/2022 11:35:01

## 2022-11-01 NOTE — Progress Notes (Signed)
[name], [name] ([id number]) [id number] Page 1 of 21 Visit Report for 11/01/2022 Chief Complaint Document Details Patient Name: Date of Service: [name], [name] 11/01/2022 11:30 A M Medical Record Number: [id number] Patient Account Number: [id number] Date of Birth/Sex: Treating RN: 1950-08-18 (72 y.o. M) Primary Care Provider: [name] Other Clinician: Referring Provider: Treating Provider/Extender: [name] in Treatment: 75 Information Obtained from: Patient Chief Complaint Left leg and foot ulcers 04/12/2021; patient is here for wounds on his left lower leg and left plantar foot over the first metatarsal head Electronic Signature(s) Signed: 11/01/2022 12:10:31 PM By: [name] MD FACS Entered By: [name] on 11/01/2022 12:10:31 -------------------------------------------------------------------------------- Debridement Details Patient Name: Date of Service: [name]. 11/01/2022 11:30 A M Medical Record Number: [id number] Patient Account Number: [id number] Date of Birth/Sex: Treating RN: 04-25-1951 (72 y.o. [name] Primary Care Provider: [name] Other Clinician: Referring Provider: Treating Provider/Extender: [name] in Treatment: 81 Debridement Performed for Assessment: Wound #18R Left,Plantar Metatarsal head first Performed By: Physician [name], MD Debridement Type: Debridement Severity of Tissue Pre Debridement: Fat layer exposed Level of Consciousness (Pre-procedure): Awake and Alert Pre-procedure Verification/Time Out Yes - 11:46 Taken: Start Time: 11:46 Pain Control: Lidocaine 4% Topical Solution Percent of Wound Bed Debrided: 100% T Area Debrided (cm): otal 0.75 Tissue and other material debrided: Non-Viable, Callus, Slough, Slough Level: Non-Viable Tissue Debridement Description: Selective/Open Wound Instrument: Curette Bleeding:  Minimum Hemostasis Achieved: Pressure Response to Treatment: Procedure was tolerated well Level of Consciousness (Post- Awake and Alert procedure): Post Debridement Measurements of Total Wound Length: (cm) 0.5 Width: (cm) 1.9 Depth: (cm) 0.2 Volume: (cm) 0.149 Character of Wound/Ulcer Post Debridement: Improved Severity of Tissue Post Debridement: Fat layer exposed Post Procedure Diagnosis Same as Pre-procedure Notes scribed for Dr. Lady Gary by [name], RN Electronic Signature(s) Signed: 11/01/2022 12:17:36 PM By: [name] MD FACS Signed: 11/01/2022 3:37:40 PM By: Waverly Ferrari ([id number]) PM By: Chesley Noon.pdf Page 2 of 21 Signed: 11/01/2022 3:37:40 aylor Entered By: [name] on 11/01/2022 11:47:27 -------------------------------------------------------------------------------- Debridement Details Patient Name: Date of Service: [name], [name] 11/01/2022 11:30 A M Medical Record Number: [id number] Patient Account Number: [id number] Date of Birth/Sex: Treating RN: 21-Jan-1951 (72 y.o. [name] Primary Care Provider: [name] Other Clinician: Referring Provider: Treating Provider/Extender: [name] in Treatment: 81 Debridement Performed for Assessment: Wound #22 Left,Proximal,Lateral Lower Leg Performed By: Physician [name], MD Debridement Type: Debridement Level of Consciousness (Pre-procedure): Awake and Alert Pre-procedure Verification/Time Out Yes - 11:46 Taken: Start Time: 11:46 Pain Control: Lidocaine 4% T opical Solution Percent of Wound Bed Debrided: 100% T Area Debrided (cm): otal 0.57 Tissue and other material debrided: Non-Viable, Slough, Slough Level: Non-Viable Tissue Debridement Description: Selective/Open Wound Instrument: Curette Bleeding: Minimum Hemostasis Achieved: Pressure Response to Treatment: Procedure was tolerated  well Level of Consciousness (Post- Awake and Alert procedure): Post Debridement Measurements of Total Wound Length: (cm) 1.2 Width: (cm) 0.6 Depth: (cm) 0.1 Volume: (cm) 0.057 Character of Wound/Ulcer Post Debridement: Improved Post Procedure Diagnosis Same as Pre-procedure Notes scribed for Dr. Lady Gary by [name], RN Electronic Signature(s) Signed: 11/01/2022 12:17:36 PM By: [name] MD FACS Signed: 11/01/2022 3:37:40 PM By: [name] Entered By: [name] on 11/01/2022 11:48:14 -------------------------------------------------------------------------------- Debridement Details Patient Name: Date of Service: [name]. 11/01/2022 11:30 A M Medical Record Number: [id number] Patient Account Number: [id number] Date of Birth/Sex: Treating RN: 05-Aug-1950 (71  y.o. [name] Primary Care Provider: [name] Other Clinician: Referring Provider: Treating Provider/Extender: [name] in Treatment: 81 Debridement Performed for Assessment: Wound #32 Left,Medial,Anterior Lower Leg Performed By: Physician [name], MD Debridement Type: Debridement Level of Consciousness (Pre-procedure): Awake and Alert Pre-procedure Verification/Time Out Yes - 11:46 Taken: Start Time: 11:46 Pain Control: Lidocaine 4% Topical Solution Percent of Wound Bed Debrided: 100% T Area Debrided (cm): otal 0.08 Tissue and other material debrided: Non-Viable, Eschar, Slough, Slough Level: Non-Viable Tissue Debridement Description: Selective/Open Wound CRISTEN, LEBEAU ([id number]) [id number] Page 3 of 21 Instrument: Curette Bleeding: Minimum Hemostasis Achieved: Pressure Response to Treatment: Procedure was tolerated well Level of Consciousness (Post- Awake and Alert procedure): Post Debridement Measurements of Total Wound Length: (cm) 0.2 Width: (cm) 0.5 Depth: (cm) 0.1 Volume: (cm)  0.008 Character of Wound/Ulcer Post Debridement: Improved Post Procedure Diagnosis Same as Pre-procedure Notes scribed for Dr. Lady Gary by [name], RN Electronic Signature(s) Signed: 11/01/2022 12:17:36 PM By: [name] MD FACS Signed: 11/01/2022 3:37:40 PM By: [name] Entered By: [name] on 11/01/2022 11:48:36 -------------------------------------------------------------------------------- HPI Details Patient Name: Date of Service: [name]. 11/01/2022 11:30 A M Medical Record Number: [id number] Patient Account Number: [id number] Date of Birth/Sex: Treating RN: [birth year] (72 y.o. M) Primary Care Provider: [name] Other Clinician: Referring Provider: Treating Provider/Extender: [name] in Treatment: 25 History of Present Illness HPI Description: 10/11/17; Mr. Rauth is a 72 year old man who tells me that in 2015 he slipped down the latter traumatizing his left leg. He developed a wound in the same spot the area that we are currently looking at. He states this closed over for the most part although he always felt it was somewhat unstable. In 2016 he hit the same area with the door of his car had this reopened. He tells me that this is never really closed although sometimes an inflow it remains open on a constant basis. He has not been using any specific dressing to this except for topical antibiotics the nature of which were not really sure. His primary doctor did send him to see Dr. Jacinto Halim of interventional cardiology. He underwent an angiogram on 08/06/17 and he underwent a PTA and directional atherectomy of the lesser distal SFA and popliteal arteries which resulted in brisk improvement in blood flow. It was noted that he had 2 vessel runoff through the anterior tibial and peroneal. He is also been to see vascular and interventional radiologist. He was not felt to have any significant superficial venous insufficiency.  Presumably is not a candidate for any ablation. It was suggested he come here for wound care. The patient is a type II diabetic on insulin. He also has a history of venous insufficiency. ABIs on the left were noncompressible in our clinic 10/21/17; patient we admitted to the clinic last week. 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 We put Iodosorb on him after an aggressive debridement and 3 layer compression. He complained of pain in his ankle and itching with is skin in fact he scratched the area on the medial calf superiorly at the rim of our wraps and he has 2 small open areas in that location today which are new. I changed his primary dressing today to silver collagen. As noted he is already had revascularization and does not have any significant superficial venous insufficiency that would be amenable to ablation 10/28/17; patient admitted  to the clinic 2 weeks ago. He has a smaller Wound. Scratch injury from last week revealed. There is large wound over the tibial area. This is smaller. Granulation looks healthy. No need for debridement. 11/04/17; the wound on the left lateral calf looks better. Improved dimensions. Surface of this looks better. We've been maintaining him and Kerlix Coban wraps. He finds this much more comfortable. Silver collagen dressing 11/11/17; left lateral Wound continues to look healthy be making progress. Using a #5 curet I removed removed nonviable skin from the surface of the wound and then necrotic debris from the wound surface. Surface of the wound continues to look healthy. He also has an open area on the left great toenail bed. We've been using topical antibiotics. 11/19/17; left anterior lateral wound continues to look healthy but it's not closed. He also had a small wound above this on the left leg Initially traumatic wounds in the setting of significant chronic venous insufficiency and stasis  dermatitis 11/25/17; left anterior wounds superiorly is closed still a small wound inferiorly. 12/02/17; left anterior tibial area. Arrives today with adherent callus. Post debridement clearly not completely closed. Hydrofera Blue under 3 layer compression. 12/09/17; left anterior tibia. Circumferential eschar however the wound bed looks stable to improved. We've been using Hydrofera Blue under 3 layer compression 12/17/17; left anterior tibia. Apparently this was felt to be closed however when the wrap was taken off there is a skin tear to reopen wounds in the same area we've been using Hydrofera Blue under 3 layer compression 12/23/17 left anterior tibia. Not close to close this week apparently the [facility] was stuck to this again. Still circumferential eschar requiring debridement. I put a contact layer on this this time under the Hydrofera Blue 12/31/17; left anterior tibia. Wound is better slight amount of hyper-granulation. Using Hydrofera Blue over Adaptic. 01/07/18; left anterior tibia. The wound had some surface eschar however after this was removed he has no open wound.he was already revascularized by Dr. Jacinto Halim when he came to our clinic with atherectomy of the left SFA and popliteal artery. He was also sent to interventional radiology for venous reflux studies. He was not felt to have significant reflux but certainly has chronic venous changes of his skin with hemosiderin deposition around this area. He will definitely ISAMU, GUNDY ([id number]) [id number] Page 4 of 21 need to lubricate his skin and wear compression stocking and I've talked to him about this. READMISSION 05/26/2018 This is a now 72 year old man we cared for with traumatic wounds on his left anterior lower extremity. He had been previously revascularized during that admission by Dr. Jacinto Halim. Apparently in follow-up Dr. Jacinto Halim noted that he had deterioration in his arterial status. He underwent a stent  placement in the distal left SFA on 04/22/2018. 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 He went back to the angiography suite on 04/30/2018 he underwent PTA and balloon angioplasty of the occluded left mid anterior tibial artery, thrombotic occlusion went from 100 to 0% which reconstitutes the posterior tibial artery. He had thrombectomy and aspiration of the peroneal artery. The stent placed in the distal SFA left SFA was still occluded. 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 He has been recuperating at [facility]  nursing home in [facility] after the most recent hospitalization. He is going home tomorrow. He tells me that 2 weeks ago he traumatized the tip of his left fifth toe. He came in urgently for our review of this. This was a history of before I noted that Dr. Jacinto Halim had already noted dry gangrenous changes of the left fifth toe 06/09/2018; 2-week follow-up. I did contact Dr. Jacinto Halim after his last appointment and he apparently saw 1 of Dr. Verl Dicker colleagues the next day. He does not follow-up with Dr. Jacinto Halim himself until Thursday of this week. 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 Nevertheless there is no evidence of infection no drainage and no pain. He had a new area that this week when we were signing him in today on the left anterior mid tibia area, this is in close proximity to the previous wound we have dealt with in this clinic. 06/23/2018; 2-week follow-up. I did not receive a recent note from Dr. Jacinto Halim to review today. Our office is trying to obtain this. He is apparently not planning to do further vascular interventions and wondered about compression to try and help with the patient's chronic  venous insufficiency. However we are also concerned about the arterial flow. He arrives in clinic today with a new area on the left third toe. The areas on the calf/anterior tibia are close to closing. The left fifth toe is still mummified using Betadine. -In reviewing things with the patient he has what sounds like claudication with mild to moderate amount of activity. 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 I prescribed Levaquin over the phone while we attempted to arrange a plan of care. However the patient called yesterday to report he had low-grade fever and he came in today acutely. There is been a marked deterioration in the left third toe with spreading cellulitis up into the dorsal left foot. He was referred to the emergency room. Readmission: 06/29/2020 patient presents today for reevaluation here in our clinic he was previously treated by Dr. Leanord Hawking at the latter part of 2019 in 2 the beginning of 2020. Subsequently we have not seen him since that time in the interim he did have evaluation with vein and vascular specialist specifically Dr. Bo Mcclintock who did perform quite extensive work for a left femoral to anterior tibial artery bypass. With that being said in the interim the patient has developed significant lymphedema and has wounds that he tells me have really never healed in regard to the incision site on the left leg. He also has multiple wounds on the feet for various reasons some of which is that he tends to pick at his feet. Fortunately there is no signs of active infection systemically at this time he does have some wounds that are little bit deeper but most are fairly superficial he seems to have good blood flow and overall everything appears to be healthy I see no bone exposed and no obvious signs of osteomyelitis. I do not know that he necessarily needs a x-ray at this point although that something we could consider depending on how things  progress. The patient does have a history of lymphedema, diabetes, this is type II, chronic kidney disease stage III, hypertension, and history of peripheral vascular disease. 07/05/2020; patient admitted last week. Is a patient I remember from 2019 he had a spreading infection involving the left foot and we sent him to the hospital.  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 He subsequently had a left femoral to anterior tibial bypass by Dr.Cain vein and vascular. He also has severe lymphedema with chronic skin changes related to that on the left leg. The most problematic area that was new today was on the left medial great toe. This was apparently a small area last week there was purulent drainage which our intake nurse cultured. Also areas on the left medial foot and heel left lateral foot. He has 2 areas on the left medial calf left lateral calf in the setting of the severe lymphedema. 07/13/2020 on evaluation today patient appears to be doing better in my opinion compared to his last visit. The good news is there is no signs of active infection systemically and locally I do not see any signs of infection either. He did have an x-ray which was negative that is great news he had a culture which showed MRSA but at the same time he is been on the doxycycline which has helped. I do think we may want to extend this for 7 additional days 1/25; patient admitted to the clinic a few weeks ago. 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 We have been putting him under compression his edema control is a lot better but he is severe verricused skin on the left leg. He is really done quite well he still has an open area on the left medial calf and the left medial first metatarsal head. We have been using silver collagen on the leg silver alginate on the foot 07/27/2020 upon evaluation today patient appears to be doing decently well in  regard to his wounds. He still has a lot of dry skin on the left leg. Some of this is starting to peel back and I think he may be able to have them out by removing some that today. Fortunately there is no signs of active infection at this time on the left leg although on the right leg he does appear to have swelling and erythema as well as some mild warmth to touch. This does have been concerned about the possibility of cellulitis although within the differential diagnosis I do think that potentially a DVT has to be at least considered. We need to rule that out before proceeding would just call in the cellulitis. Especially since he is having pain in the posterior aspect of his calf muscle. 2/8; the patient had seen sparingly. He has severe skin changes of chronic lymphedema in the left leg thickened hyperkeratotic verrucous skin. He has an open wound on the medial part of the left first met head left mid tibia. He also has a rim of nonepithelialized skin in the anterior mid tibia. He brought in the AmLactin lotion that was been prescribed although I am not sure under compression and its utility. There concern about cellulitis on the right lower leg the last time he was here. He was put on on antibiotics. His DVT rule out was negative. The right leg looks fine he is using his stocking on this area 08/10/2020 upon evaluation today patient appears to be doing well with regard to his leg currently. He has been tolerating the dressing changes without complication. Fortunately there is no signs of active infection which is great news. Overall very pleased with where things stand. 2/22; the patient still has an area on the medial part of the left first  met his head. This looks better than when I last saw this earlier this month he has a rim of epithelialization but still some surface debris. Mostly everything on the left leg is healed. There is still a vulnerable in the left mid tibia area. 08/30/2020 upon  evaluation today patient appears to be doing much better in regard to his wounds on his foot. 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 Nonetheless I think we will address that today I Minna send in a prescription for him in that regard. Overall though there does not appear to be any signs of significant worsening. 09/07/2020 on evaluation today patient's wounds over his left foot appear to be doing excellent. I do not see any signs of infection there is some callus buildup this can require debridement for certain but overall I feel like he is managing quite nicely. He still using the AmLactin cream which has been beneficial for him as well. 3/22; left foot wound is closed. There is no open area here. He is using ammonium lactate lotion to the lower extremities to help exfoliate dry cracked skin. He has compression stockings from elastic therapy in [place]. The wound on the medial part of his left first met head is healed today. READMISSION 04/12/2021 Mr. Kitt is a patient we know fairly well he had a prolonged stay in clinic in 2019 with wounds on his left lateral and left anterior lower extremity in the setting of chronic venous insufficiency. More recently he was here earlier this year with predominantly an area on his left foot first metatarsal head plantar and he says the plantar foot broke down on its not long after we discharged him but he did not come back here. The last few months areas of broken down on his left anterior and again the left lateral lower extremity. The leg itself is very swollen chronically enlarged a lot of hyperkeratotic dry Berry Q skin in the left lower leg. His edema extends well into the thigh. TOMI, DERMAN ([id number]) [id number] Page 5 of 21 He was seen by Dr. Randie Heinz. He had ABIs on 03/02/2021 showing an  ABI on the right of 1 with a TBI of 0.72 his ABI in the left at 1.09 TBI of 0.99. Monophasic and biphasic waveforms on the right. On the left monophasic waveforms were noted he went on to have an angiogram on 03/27/2021 this showed the aortic aortic and iliac segments were free of flow-limiting stenosis the left common femoral vein to evaluate the left femoral to anterior tibial artery bypass was unobstructed the bypass was patent without any areas of stenosis. We discharged the patient in bilateral juxta lite stockings but very clearly that was not sufficient to control the swelling and maintain skin integrity. He is clearly going to need compression pumps. 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 This is fortunate because he is on his feet for long periods of time. 10/27; patient comes in with our intake nurse reporting copious amount of green drainage from the left anterior mid tibia the left dorsal foot and to a lesser extent the left medial mid tibia. We left the compression wrap on all week for the amount of edema in his left leg is quite a bit better. We use silver alginate  as the primary dressing 11/3; edema control is good. Left anterior lower leg left medial lower leg and the plantar first metatarsal head. The left anterior lower leg required debridement. Deep tissue culture I did of this wound showed MRSA I put him on 10 days of doxycycline which she will start today. We have him in compression wraps. He has a security card and [employer] however he is retiring on November 15. We will need to then get him into a better offloading boot for the left foot perhaps a total contact cast 11/10; edema control is quite good. Left anterior and left medial lower leg wounds in the setting of chronic venous insufficiency and lymphedema. He also has a substantial area over the left plantar first metatarsal head. I treated him for MRSA that we identified on  the major wound on the left anterior mid tibia with doxycycline and gentamicin topically. He has significant hypergranulation on the left plantar foot wound. The patient is a diabetic but he does not have significant PAD 11/17; edema control is quite good. Left anterior and left medial lower leg wounds look better. The really concerning area remains the area on the left plantar first metatarsal head. He has a rim of epithelialization. 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 Starting today with a forefoot off loader but . possibly a total contact cast. 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 Arrives with a very significant odor from I think all of the wounds on the left leg and the left foot. Because of this I did not put a total contact cast on him today but will could still consider this. His wife was having cataract surgery which is the reason he missed the appointment 12/6. I saw this man 5 days ago with a swelling below the popliteal fossa. I thought he actually might have a Baker's cyst however the DVT rule out study that we could arrange right away was negative the technician told me this was not a ruptured Baker's cyst. We attempted to get this aspirated by under ultrasound guidance in interventional radiology however all they did was an ultrasound however it shows an extensive fluid collection 62 x 8 x 9.4 in the left thigh and left calf. The patient states he thinks this started 8 days ago or so but he really is not complaining of any pain, fever or systemic symptoms. He has not ha 12/20; after some difficulty I managed to get the patient into see Dr. Randie Heinz. Eventually he was taken into the hospital and had a drain put in the fluid collection below his left knee  posteriorly extending into the posterior thigh. He still has the drain in place. Culture of this showed moderate staff aureus few Morganella and few Klebsiella he is now on doxycycline and ciprofloxacin as suggested by infectious disease he is on this for a month. The drain will remain in place until it stops draining 12/29; he comes in today with the 1 wound on his left leg and the area on the left plantar first met head significantly smaller. Both look healthy. He still has the drain in the left leg. He says he has to change this daily. Follows up with Dr. Randie Heinz on January  11. 06/29/2021; the wounds that I am following on the left leg and left first met head continued to be quite healthy. However the area where his inferior drain is in place had copious amounts of drainage which was green in color. The wound here is larger. Follows up with Dr. Pascal Lux of vein and vascular his surgeon next week as well as infectious disease. He remains on ciprofloxacin and doxycycline. He is not complaining of excessive pain in either one of the drain areas 1/12; the patient saw vascular surgery and infectious disease. Vascular surgery has left the drain in place as there was still some notable drainage still see him back in 2 weeks. Dr. Dorthula Perfect stop the doxycycline and ciprofloxacin and I do not believe he follows up with them at this point. Culture I did last week showed both doxycycline resistant MRSA and Pseudomonas not sensitive to ciprofloxacin although only in rare titers 1/19; the patient's wound on the left anterior lower leg is just about healed. We have continued healing of the area that was medially on the left leg. Left first plantar metatarsal head continues to get smaller. The major problem here is his 2 drain sites 1 on the left upper calf and lateral thigh. There is purulent drainage still from the left lateral thigh. I gave him antibiotics last week but we still have recultured. He has the drain in the area  I think this is eventually going to have to come out. I suspect there will be a connecting wound to heal here perhaps with improved VAc 1/26; the patient had his drain removed by vein and vascular on 1/25/. This was a large pocket of fluid in his left thigh that seem to tunnel into his left upper calf. 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 His mention his Penrose drain was removed today. He now has a tunneling wound on his left calf and left thigh. Both of these probe widely towards each other although I cannot really prove that they connect. Both wounds on his lower leg anteriorly are closed and his area over the first metatarsal head on his right foot continues to improve. We are using Hydrofera Blue here. He also saw infectious disease culture of the abscess they noted was polymicrobial with MRSA, Morganella and Klebsiella he was treated with doxycycline and ciprofloxacin for 4 weeks ending on 07/03/2021. They did not recommend any further antibiotics. Notable that while he still had the Penrose drain in place last week he had purulent drainage coming out of the inferior IandD site this grew [facility], MRSA and Pseudomonas but there does not appear to be any active infection in this area today with the drain out and he is not systemically unwell 2/2; with regards to the drain sites the superior one on the thigh actually is closed down the one on the upper left lateral calf measures about 8 and half centimeters which is an improvement seems to be less prominent although still with a lot of drainage. The only remaining wound is over the first metatarsal head on the left foot and this looks to be continuing to improve with Hydrofera Blue. 2/9; the area on his plantar left foot continues to contract. Callus around the wound edge. The drain sites specifically have not come down in depth. We put the wound VAC on Monday he changed the canister late last night our intake nurse  reported a pocket of fluid perhaps caused by our compression wraps 2/16; continued  improvement in left foot plantar wound. drainage site in the calf is not improved in terms of depth (wound vac) 2/23; continued improvement in the left foot wound over the first metatarsal head. With regards to the drain sites the area on his thigh laterally is healed however the open area on his calf is small in terms of circumference by still probes in by about 15 cm. Within using the wound VAC. Hydrofera Blue on his foot 08/24/2021: The left first metatarsal head wound continues to improve. The wound bed is healthy with just some surrounding callus. Unfortunately the open drain site on his calf remains open and tunnels at least 15 cm (the extent of a Q-tip). This is despite several weeks of wound VAC treatment. Based on reading back through the notes, there has been really no significant change in the depth of the wound, although the orifice is smaller and the more cranial wound on his thigh has closed. I suspect the tunnel tracks nearly all the way to this location. 08/31/2021: Continued improvement in the left first metatarsal head wound. There has been absolutely no improvement to the long tunnel from his open drain site on his calf. We have tried to get him into see vascular surgery sooner to consider the possibility of simply filleting the tract open and allowing it to heal from the bottom up, likely with a wound VAC. They have not yet scheduled a sooner appointment than his current mid April KAMARIEN, LANGRIDGE ([id number]) [phone].pdf Page 6 of 21 09/14/2021: He was seen by vascular surgery and they took him to the operating room last week. They opened a portion of the tunnel, but did not extend the entire length of the known open subcutaneous tract. I read Dr. Darcella Cheshire operative note and it is not clear from that documentation why only a portion of the tract was opened. The heaped up  granulation tissue was curetted and removed from at least some portion of the tract. They did place a wound VAC and applied an Unna boot to the leg. The ulcer on his left first metatarsal head is smaller today. The bed looks good and there is just a small amount of surrounding callus. 09/21/2021: The ulcer on his left first metatarsal head looks to be stalled. There is some callus surrounding the wound but the wound bed itself does not appear particularly dynamic. The tunnel tract on his lateral left leg seems to be roughly the same length or perhaps slightly smaller but the wound bed appears healthy with good granulation tissue. He opened up a new wound on his medial thigh and the site of a prior surgical incision. He says that he did this unconsciously in his sleep by scratching. 09/28/2021: Unfortunately, the ulcer on his left first metatarsal head has extended underneath the callus toward the dorsum of his foot. The medial thigh wounds are roughly the same. The tunnel on his lateral left leg continues to be problematic; it is longer than we are able to actually probe with a Q-tip. I am still not certain as to why Dr. Randie Heinz did not open this up entirely when he took the patient to the operating room. We will likely be back in the same situation with just a small superficial opening in a long unhealed tract, as the open portion is granulating in nicely. 10/02/2021: The patient was initially scheduled for a nurse visit, but we are also applying a total contact cast today. The plantar foot wound looks clean without significant  accumulated callus. We have been applying Prisma silver collagen to the site. 10/05/2021: The patient is here for his first total contact cast change. We have tried using gauze packing strips in the tunnel on his lateral leg wound, but this does not seem to be working any better than the white VAC foam. The foot ulcer looks about the same with minimal periwound callus. Medial thigh  wound is clean with just some overlying eschar. 10/12/2021: The plantar foot wound is stable without any significant accumulation of periwound callus. The surface is viable with good granulation tissue. The medial thigh wounds are much smaller and are epithelializing. On the other hand, he had purulent drainage coming from the tunnel on his lateral leg. He does go back to see Dr. Randie Heinz next week and is planning to ask him why the wound tunnel was not completely opened at the time of his most recent operation. 10/19/2021: The plantar foot wound is markedly improved and has epithelial tissue coming through the surface. The medial thigh wounds are nearly closed with just a tiny open area. He did see Dr. Randie Heinz earlier this week and apparently they did discuss the possibility of opening the sinus tract further and enabling a wound VAC application. Apparently there are some limits as to what Dr. 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 I think if we could get the tract open to the level of the popliteal fossa, this would greatly aid in her ability to get this chart closed. That being said, however, today when I probed the tract with a Q-tip, I was not able to insert the entirety of the Q-tip as I have on previous occasions. The tunnel is shorter by about 4 cm. The surface is clean with good granulation tissue and no further episodes of purulent drainage. 10/30/2021: Last week, the patient underwent surgery and had the long tract in his leg opened. There was a rind that was debrided, according to the operative report. His medial thigh ulcers are closed. The plantar foot wound is clean with a good surface and some built up surrounding callus. 11/06/2021: The overall dimensions of the large wound on his lateral leg remain about the same, but there is good granulation tissue present and the tunneling is a little bit shorter. He has a new wound on his anterior tibial surface, in  the same location where he had a similar lesion in the past. The plantar foot wound is clean with some buildup surrounding callus. Just toward the medial aspect of his foot, however, there is an area of darkening that once debrided, revealed another opening in the skin surface. 11/13/2021: The anterior tibial surface wound is closed. The plantar foot wound has some surrounding callus buildup. The area of darkening that I debrided last week and revealed an opening in the skin surface has closed again. The tunnel in the large wound on his lateral leg has come in by about 3 cm. There is healthy granulation tissue on the entire wound surface. 11/23/2021: The patient was out of town last week and did wet-to-dry dressings on his large wound. 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 Unfortunately, he picked open the wound on his left medial thigh. He says that it was itching and he just could not stop scratching it until it was open again. The wound on his plantar foot is smaller and has not accumulated a tremendous amount of  callus. The lateral leg wound is shallower and the tunnel has also decreased in depth. There is just a little bit of slough accumulation on the surface. 11/30/2021: Another portion of his left medial thigh has been opened up. All of these wounds are fairly superficial with just a little bit of slough and eschar accumulation. The wound on his plantar foot is almost closed with just a bit of eschar and periwound callus accumulation. The lateral leg wound is nearly flush with the surrounding skin and the tunnel is markedly shallower. 12/07/2021: There is just 1 open area on his left medial thigh. It is clean with just a little bit of perimeter eschar. The wound on his plantar foot continues to contract and just has some eschar and periwound callus accumulation. The lateral leg wound is closing at the more distal aspect and the  tunnel is smaller. The surface is nearly flush with the surrounding skin and it has a good bed of granulation tissue. 12/14/2021: The thigh and foot wounds are closed. The lateral leg wound has closed over approximately half of its length. The tunnel continues to contract and the surface is now flush with the surrounding skin. The wound bed has robust granulation tissue. 12/22/2021: The thigh and foot wounds have reopened. The foot wound has a lot of callus accumulation around and over it. The thigh wound is tiny with just a little bit of slough in the wound bed. The lateral leg wound continues to contract. His vascular surgeon took the wound VAC off earlier in the week and the patient has been doing wet-to-dry dressings. There is a little slough accumulation on the surface. The tunnel is about 3 cm in depth at this point. 12/28/2021: The thigh wound is closed again. The foot wound has some callus that subsequently has peeled back exposing just a small slit of a wound. The lateral leg wound Is down to about half the size that it originally was and the tunnel is down to about half a centimeter in depth. 01/04/2022: The thigh wound remains closed. The foot wound has heavy callus overlying the wound site. Once this was debrided, the wound was found to be closed. The lateral leg wound is smaller again this week and very superficial. No tunnel could be identified. 01/12/2022: The thigh and foot wounds both remain closed. The lateral leg wound is now nearly flush with the skin surface. There is good granulation tissue present with a light layer of slough. 01/19/2022: Due to the way his wrap was placed, the patient did not change the dressing on his thigh at all and so the foam was saturated and his skin is macerated. There is a light layer of slough on the wound surface. The underlying granulation tissue is robust and healthy-appearing. He has heavy callus buildup at the site of his first metatarsal head wound  which is still healed. 02/01/2022: He has been in silver alginate. When he removed the dressing from his thigh wound, however, some leg, superficially reopening a portion of the wound that had healed. In addition, underneath the callus at his left first metatarsal head, there appears to be a blister and the wound appears to be open again. 02/08/2022: The lateral leg wound has contracted substantially. There is eschar and a light layer of slough present. He says that it is starting to pull and is uncomfortable. On inspection, there is some puckering of the scar and the eschar is quite dry; this may account for his symptoms. On his  first metatarsal head, the wound is much smaller with just some eschar on the surface. The callus has not reaccumulated. He reports that he had a blister come up on his medial thigh wound at the distal aspect. It popped and there is now an opening in his skin again. Looking back through his library of wound photos, there is what looks like a permanent suture just deep to this location and it may be trying to erode through. We have been using silver alginate on his wounds. 02/15/2022: The lateral leg wound is about half the size it was last week. It is clean with just a little perimeter eschar and light slough. The wound on his first metatarsal head is about the same with heavy callus overlying it. The medial thigh wound is closed again. He does have some skin changes on the top of his foot that looks potentially yeast related. ANIRUDDH, HARTLIEB ([id number]) [id number] Page 7 of 21 02/22/2022: The skin on the top of his foot improved with the use of a topical antifungal. The lateral leg wound continues to contract and is again smaller this week. There is a little bit of slough and eschar on the surface. The first metatarsal head wound is a little bit smaller but has reaccumulated a thick callus over the top. He decided to try to trim his toenail and  ultimately took the entire nail off of his left great toe. 03/02/2022: His lateral leg wound continues to improve, as does the wound on his left great toe. Unfortunately, it appears that somehow his foot got wet and moisture seeped in through the opening causing his skin to lift. There is a large wound now overlying his first metatarsal on both the plantar, medial, and dorsal portion of his foot. There is necrotic tissue and slough present underneath the shaggy macerated skin. 03/08/2022: The lateral leg wound is smaller again today. There is just a light layer of slough and eschar on the surface. The great toe wound is smaller again today. The first metatarsal wound is a little bit smaller today and does not look nearly as necrotic and macerated. There is still slough and nonviable tissue present. 03/15/2022: The lateral leg wound is narrower and just has a little bit of light slough buildup. The first metatarsal wound still has a fair amount of moisture affecting the periwound skin. The great toe wound is healed. 03/22/2022: The lateral leg wound is now isolated to just at the level of his knee. There is some eschar and slough accumulation. The first metatarsal head wound has epithelialized tremendously and is about half the size that it was last week. He still has some maceration on the top of his foot and a fungal odor is present. 03/29/2022: T oday the patient's foot was macerated, suggesting that the cast got wet. The patient has also been picking at his dry skin and has enlarged the wound on his left lateral leg. In the time between having his cast removed and my evaluation, he had picked more dry skin and opened up additional wounds on his Achilles area and dorsal foot. The plantar first metatarsal head wound, however, is smaller and clean with just macerated callus around the perimeter and light slough on the surface. The lateral leg wound measured a little bit larger but is also fairly clean with  eschar and minimal slough. 04/02/2022: The patient had vascular studies done last Friday and so his cast was not applied. He is here today to have that  done. 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 04/05/2022: Both wounds are smaller and quite clean. There is just a little biofilm on the lateral leg wound. 10/20; the patient has a wound on the left lateral surgical incision at the level of his lateral knee this looks clean and improved. He is using silver alginate. He also has an area on his left medial foot for which she is using Hydrofera Blue under a total contact cast both wounds are measuring smaller 04/20/2022: The plantar foot wound has contracted considerably and is very close to closing. The lateral leg wound was measured a little larger, but there was a tiny open area that was included in the measurements that was not included last week. He has some eschar around the perimeter but otherwise the wound looks clean. 04/27/2022: The lateral leg wound looks better this week. He says that midweek, he felt it was very dry and began applying hydrogel to the site. I think this was beneficial. The foot wound is nearly closed underneath a thick layer of dry skin and callus. 05/04/2022: The foot wound is healed. He has developed a new small ulcer on his anterior tibial surface about midway up his leg. It has a little slough on the surface. The lateral leg wound still is fairly dry, but clean with just a little biofilm on the surface. 05/11/2022: The wound on his foot reopened on Wednesday. A large blister formed which then broke open revealing the fat layer underneath. The ulcer on his anterior tibial surface is a little bit larger this week. The lateral leg wound has much better moisture balance this week. Fortunately, prior to his foot wound reopening, he did get the cast made for his orthotic. 05/15/2022: Already, the left medial foot wound has improved. The tissue is less macerated and  the surface is clean. The ulcer on his anterior tibial surface continues to enlarge. This seems likely secondary to accumulated moisture. The lateral leg wound continues to have an improved moisture balance with the use of collagen. 05/25/2022: The medial foot wound continues to contract. It is now substantially smaller with just a little slough on the surface. The anterior tibial surface wound continues to enlarge further. Once again, this seems to be secondary to moisture. The lateral leg wound does not seem to be changing much in size, but the moisture balance is better. 06/01/2022: The anterior tibial wound is closed. The medial foot wound is down to just a very small, couple of millimeters, opening. The lateral leg wound has good moisture balance, but remains unchanged in size. 12/15; the patient's anterior tibial wound has reopened, however the area on his right first metatarsal head is closed. The major wound is actually on the superior part of his surgical wound in the left lateral thigh. Not a completely viable surface under illumination. This may at some point require a debridement I think he is currently using Prisma. As noted the left medial foot wound has closed 06/14/2022: The anterior tibial wound has closed. The lateral leg wound has a better surface but is basically unchanged in size. The left medial foot wound has reopened. It looks as though there was some callus accumulation and moisture got under the callus which caused the tissue to break down again. 06/21/2022: A new wound has opened up just distal to the previous anterior tibial wound. It is small but has hypertrophic granulation tissue present. The lateral leg wound is a little bit narrower and has a layer  of slough on the surface. The left medial foot wound is down to just a pinhole. His custom orthotics should be available next week. 06/28/2022: The wound on his first metatarsal head has healed. He has developed a new small  wound on his medial lower leg, in an old scar site. The lateral leg wound continues to contract but continues to accumulate slough, as well. 07/03/2022: Despite wearing his custom orthopedic shoes, he managed to reopen the wound on his first metatarsal head. He says he thinks his foot got wet and then some skin lifted up and he peeled this away. Both of the lower leg wounds are smaller and have some dry eschar on the surface. The lateral leg wound is quite a bit narrower today. 07/12/2022: The medial lower leg wound is closed. The anterior lower leg wound has contracted considerably. The lateral upper leg wound is narrower with a layer of slough on the surface. The first metatarsal head wound is also smaller, but had copious drainage which saturated the foam border dressing and resulted in some periwound tissue maceration. Fortunately there was no breakdown at this site. 07/19/2022: The lower leg shows signs of significant maceration; I think he must be sweating excessively inside his cast. There are several areas of skin breakdown present. The wound on his foot is smaller and that on his lateral leg is narrower and is shorter by about a centimeter. 07/26/2022: Last week we used a zinc Coflex wrap prior to applying his total contact cast and this has had the effect of keeping his skin from getting macerated this week. The anterior leg wound has epithelialized substantially. The lateral leg wound is significantly smaller with just a bit of slough on the surface. The first metatarsal head wound is also smaller this week. 08/02/2022: The anterior leg wound was closed on arrival, but while he was sitting in the room, he picked it open again. The lateral leg wound is smaller with just a little slough on the surface and the first metatarsal head wound has contracted further, as well. 08/09/2022: The first metatarsal head wound is covered with callus. Underneath the callus, it is nearly completely closed. The lateral  leg wound is smaller again this week. The anterior leg wound looks better, but he has such heavy buildup of old skin, that moisture is getting underneath this, becoming trapped, and MANNAN, DEBAKER ([id number]) [id number] Page 8 of 21 causing the underlying skin to get macerated and open up. 08/16/2022: The first metatarsal head wound is closed. The lateral leg wound continues to contract and is quite a bit smaller again this week. There is just a small, superficial opening remaining on his anterior tibial surface. 08/23/2022: The first metatarsal head wound has, by some miracle, remained closed. The lateral leg wound is substantially smaller with multiple areas of epithelialization. The anterior tibial surface wound is also quite a bit smaller and very clean. 08/30/2022: Unfortunately, his first metatarsal head wound opened up again. It happened in the same fashion as it has on prior occasions. Moisture got under dried skin/callus and created a wound when he removed his sock, taking the skin with it. The anterior tibial surface has a thick shell of hyperkeratotic skin. This has been contributing to ongoing repeat wounding events as moisture gets underneath this and causes tissue breakdown. 3/15; patient presents for follow-up. His anterior left leg wound has healed. He still has the wound to the left lateral aspect and left first met head. We have been using  silver alginate and endoform to these areas under [employer]. He has no issues or complaints today. He has been taking Augmentin and reports improvement to his symptoms to the left first met head. 09/13/2022: He has accumulated more thick dry skin in sheets on his lower leg. The lateral leg wound is about the same size and the left first metatarsal head wound is a little bit smaller. There is slough on both surfaces. There is callus buildup around the foot wound. 09/20/2022: The lateral leg wound is a little bit narrower and  the left first metatarsal head wound also seems to have contracted slightly. There is slough on both surfaces. He has a little skin breakdown on his anterior tibial surface. 09/27/2022: The lateral leg wound continues to contract and is quite clean. The first metatarsal head wound is also smaller. There is some perimeter callus and slough accumulation on the foot. The anterior tibial surface is closed. 10/04/2022: Both of his wounds are smaller today, particularly the first metatarsal head wound. 10/18/2022: He missed his appointment last week and ended up cutting off his wrap on Saturday. The anterior tibial wound reopened. It is fairly superficial with a little bit of slough on the surface. His lateral leg wound is smaller with some slough and eschar buildup. The first metatarsal head wound is also smaller with some callus and slough accumulation. 10/25/2022: All wounds are smaller. There is slough and eschar on the lateral leg and slough and callus on the plantar foot wound. The anterior tibial wound is clean and flush with the surrounding skin. No debris accumulation here. 11/01/2022: The wounds are all smaller again this week. There is slough on the lateral leg and some minimal slough and eschar on the anterior tibial wound. There is callus accumulation on the first metatarsal head site, along with slough. There is also a yeasty odor coming from the foot. Electronic Signature(s) Signed: 11/01/2022 12:12:06 PM By: [name] MD FACS Entered By: [name] on 11/01/2022 12:12:06 -------------------------------------------------------------------------------- Physical Exam Details Patient Name: Date of Service: [name]. 11/01/2022 11:30 A M Medical Record Number: [id number] Patient Account Number: [id number] Date of Birth/Sex: Treating RN: 08/13/1950 (72 y.o. M) Primary Care Provider: [name] Other Clinician: Referring Provider: Treating Provider/Extender: [name] in Treatment: 67 Constitutional Slightly hypertensive. . . . no acute distress. Respiratory Normal work of breathing on room air. Notes 11/01/2022: The wounds are all smaller again this week. There is slough on the lateral leg and some minimal slough and eschar on the anterior tibial wound. There is callus accumulation on the first metatarsal head site, along with slough. There is also a yeasty odor coming from the foot. Electronic Signature(s) Signed: 11/01/2022 12:13:41 PM By: [name] MD FACS Entered By: [name] on 11/01/2022 12:13:41 -------------------------------------------------------------------------------- Physician Orders Details Patient Name: Date of Service: [name]. 11/01/2022 11:30 A M Medical Record Number: [id number] Patient Account Number: [id number] Date of Birth/Sex: Treating RN: [birth year] (72 y.o. [name] Primary Care Provider: [name] Other Clinician: Referring Provider: Treating Provider/Extender: [name] in Treatment: 4 Verbal / Phone Orders: No [name] ([id number]) [id number] Page 9 of 21 Diagnosis Coding ICD-10 Coding Code Description L97.528 Non-pressure chronic ulcer of other part of left foot with other specified severity L85.9 Epidermal thickening, unspecified L97.828 Non-pressure chronic ulcer of other part of left lower leg with other specified severity I87.322 Chronic venous hypertension (idiopathic) with inflammation of left lower  extremity E11.51 Type 2 diabetes mellitus with diabetic peripheral angiopathy without gangrene I89.0 Lymphedema, not elsewhere classified Follow-up Appointments ppointment in 1 week. - Dr Lady Gary - Room 2 Return A Anesthetic Wound #22 Left,Proximal,Lateral Lower Leg (In clinic) Topical Lidocaine 4% applied to wound bed Bathing/ Shower/ Hygiene May shower with protection but do not get  wound dressing(s) wet. Protect dressing(s) with water repellant cover (for example, large plastic bag) or a cast cover and may then take shower. Edema Control - Lymphedema / SCD / Other Avoid standing for long periods of time. Patient to wear own compression stockings every day. - on right leg; Moisturize legs daily. Compression stocking or Garment 20-30 mm/Hg pressure to: - left leg daily Off-Loading Wound #18R Left,Plantar Metatarsal head first Open toe surgical shoe to: - Front off loader Left ft Other: - minimal weight bearing left foot Wound Treatment Wound #18R - Metatarsal head first Wound Laterality: Plantar, Left Cleanser: Soap and Water 1 x Per Week/30 Days Discharge Instructions: May shower and wash wound with dial antibacterial soap and water prior to dressing change. Cleanser: Wound Cleanser 1 x Per Week/30 Days Discharge Instructions: Cleanse the wound with wound cleanser prior to applying a clean dressing using gauze sponges, not tissue or cotton balls. Peri-Wound Care: Ketoconazole Cream 2% 1 x Per Week/30 Days Discharge Instructions: Apply Ketoconazole as directed Prim Dressing: Sorbalgon AG Dressing 2x2 (in/in) 1 x Per Week/30 Days ary Discharge Instructions: Apply to wound bed as instructed Secondary Dressing: Optifoam Non-Adhesive Dressing, 4x4 in 1 x Per Week/30 Days Discharge Instructions: Apply over primary dressing as directed. Secondary Dressing: Zetuvit Plus 4x8 in 1 x Per Week/30 Days Discharge Instructions: Apply over primary dressing as directed. Compression Wrap: CoFlex Zinc Unna Boot, 4 x 6 (in/yd) 1 x Per Week/30 Days Discharge Instructions: Apply Coflex Zinc [employer] as directed. Wound #22 - Lower Leg Wound Laterality: Left, Lateral, Proximal Cleanser: Soap and Water 3 x Per Week/30 Days Discharge Instructions: May shower and wash wound with dial antibacterial soap and water prior to dressing change. Cleanser: Wound Cleanser 3 x Per Week/30  Days Discharge Instructions: Cleanse the wound with wound cleanser prior to applying a clean dressing using gauze sponges, not tissue or cotton balls. Peri-Wound Care: Sween Lotion (Moisturizing lotion) 3 x Per Week/30 Days Discharge Instructions: Apply moisturizing lotion to the leg Topical: Skintegrity Hydrogel 4 (oz) 3 x Per Week/30 Days Discharge Instructions: Apply hydrogel as directed Prim Dressing: Endoform 2x2 in 3 x Per Week/30 Days ary Discharge Instructions: Moisten with Hydrogel or saline Secondary Dressing: ABD Pad, 8x10 (Generic) 3 x Per Week/30 Days Discharge Instructions: Apply over primary dressing as directed. MARQ, MILIC ([id number]) [id number] Page 10 of 21 Secured With: Elastic Bandage 4 inch (ACE bandage) 3 x Per Week/30 Days Discharge Instructions: Secure with ACE bandage as directed. Wound #32 - Lower Leg Wound Laterality: Left, Medial, Anterior Cleanser: Soap and Water 1 x Per Week/30 Days Discharge Instructions: May shower and wash wound with dial antibacterial soap and water prior to dressing change. Cleanser: Wound Cleanser 1 x Per Week/30 Days Discharge Instructions: Cleanse the wound with wound cleanser prior to applying a clean dressing using gauze sponges, not tissue or cotton balls. Prim Dressing: Maxorb Extra Ag+ Alginate Dressing, 2x2 (in/in) 1 x Per Week/30 Days ary Discharge Instructions: Apply to wound bed as instructed Secondary Dressing: Woven Gauze Sponge, Non-Sterile 4x4 in 1 x Per Week/30 Days Discharge Instructions: Apply over primary dressing as directed. Compression Wrap: CoFlex Zinc [employer]  Boot, 4 x 6 (in/yd) 1 x Per Week/30 Days Discharge Instructions: Apply Coflex Zinc [employer] as directed. Patient Medications llergies: No Known Drug Allergies A Notifications Medication Indication Start End 11/01/2022 lidocaine DOSE topical 4 % cream - cream topical Electronic Signature(s) Signed: 11/01/2022 12:17:36 PM By:  [name] MD FACS Entered By: [name] on 11/01/2022 12:14:50 -------------------------------------------------------------------------------- Problem List Details Patient Name: Date of Service: [name]. 11/01/2022 11:30 A M Medical Record Number: [id number] Patient Account Number: [id number] Date of Birth/Sex: Treating RN: 1951/03/24 (72 y.o. M) Primary Care Provider: [name] Other Clinician: Referring Provider: Treating Provider/Extender: [name] in Treatment: 17 Active Problems ICD-10 Encounter Code Description Active Date MDM Diagnosis L97.528 Non-pressure chronic ulcer of other part of left foot with other specified 08/26/2022 No Yes severity L85.9 Epidermal thickening, unspecified 08/30/2022 No Yes L97.828 Non-pressure chronic ulcer of other part of left lower leg with other specified 04/12/2021 No Yes severity I87.322 Chronic venous hypertension (idiopathic) with inflammation of left lower 04/12/2021 No Yes extremity E11.51 Type 2 diabetes mellitus with diabetic peripheral angiopathy without gangrene 04/12/2021 No Yes MARCHELLO, ROHAL ([id number]) [id number] Page 11 of 21 I89.0 Lymphedema, not elsewhere classified 04/12/2021 No Yes Inactive Problems ICD-10 Code Description Active Date Inactive Date E11.621 Type 2 diabetes mellitus with foot ulcer 04/12/2021 04/12/2021 L97.828 Non-pressure chronic ulcer of other part of left lower leg with other specified severity 06/08/2022 06/08/2022 E11.42 Type 2 diabetes mellitus with diabetic polyneuropathy 04/12/2021 04/12/2021 L02.416 Cutaneous abscess of left lower limb 06/13/2021 06/13/2021 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 07/20/2021 07/20/2021 Resolved Problems Electronic Signature(s) Signed: 11/01/2022 12:08:17 PM By: [name] MD FACS Entered By: [name] on 11/01/2022  12:08:16 -------------------------------------------------------------------------------- Progress Note Details Patient Name: Date of Service: [name]. 11/01/2022 11:30 A M Medical Record Number: [id number] Patient Account Number: [id number] Date of Birth/Sex: Treating RN: 04/14/1951 (72 y.o. M) Primary Care Provider: [name] Other Clinician: Referring Provider: Treating Provider/Extender: [name] in Treatment: 66 Subjective Chief Complaint Information obtained from Patient Left leg and foot ulcers 04/12/2021; patient is here for wounds on his left lower leg and left plantar foot over the first metatarsal head History of Present Illness (HPI) 10/11/17; Mr. Schlimgen is a 72 year old man who tells me that in 2015 he slipped down the latter traumatizing his left leg. He developed a wound in the same spot the area that we are currently looking at. He states this closed over for the most part although he always felt it was somewhat unstable. In 2016 he hit the same area with the door of his car had this reopened. He tells me that this is never really closed although sometimes an inflow it remains open on a constant basis. He has not been using any specific dressing to this except for topical antibiotics the nature of which were not really sure. His primary doctor did send him to see Dr. Jacinto Halim of interventional cardiology. He underwent an angiogram on 08/06/17 and he underwent a PTA and directional atherectomy of the lesser distal SFA and popliteal arteries which resulted in brisk improvement in blood flow. It was noted that he had 2 vessel runoff through the anterior tibial and peroneal. He is also been to see vascular and interventional radiologist. He was not felt to have any significant superficial venous insufficiency. Presumably is not a candidate for any ablation. It was suggested he come here for wound care. The  patient is a type II diabetic on insulin. He  also has a history of venous insufficiency. ABIs on the left were noncompressible in our clinic 10/21/17; patient we admitted to the clinic last week. 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 We put Iodosorb on him after an aggressive debridement and 3 layer compression. He complained of pain in his ankle and itching with is skin in fact he scratched the area on the medial calf superiorly at the rim of our wraps and he has 2 small open areas in that location today which are new. I changed his primary dressing today to silver collagen. As noted he is already had revascularization and does not have any significant superficial venous insufficiency that would be amenable to ablation 10/28/17; patient admitted to the clinic 2 weeks ago. He has a smaller Wound. Scratch injury from last week revealed. There is large wound over the tibial area. This is smaller. Granulation looks healthy. No need for debridement. 11/04/17; the wound on the left lateral calf looks better. Improved dimensions. Surface of this looks better. We've been maintaining him and Kerlix Coban wraps. He finds this much more comfortable. Silver collagen dressing 11/11/17; left lateral Wound continues to look healthy be making progress. Using a #5 curet I removed removed nonviable skin from the surface of the wound and then necrotic debris from the wound surface. Surface of the wound continues to look healthy. ooHe also has an open area on the left great toenail bed. We've been using topical antibiotics. 11/19/17; left anterior lateral wound continues to look healthy but it's not closed. ooHe also had a small wound above this on the left leg ooInitially traumatic wounds in the setting of significant chronic venous insufficiency and stasis dermatitis BRETT, ILLES ([id number]) [id number] Page 12 of 21 11/25/17; left anterior wounds superiorly is  closed still a small wound inferiorly. 12/02/17; left anterior tibial area. Arrives today with adherent callus. Post debridement clearly not completely closed. Hydrofera Blue under 3 layer compression. 12/09/17; left anterior tibia. Circumferential eschar however the wound bed looks stable to improved. We've been using Hydrofera Blue under 3 layer compression 12/17/17; left anterior tibia. Apparently this was felt to be closed however when the wrap was taken off there is a skin tear to reopen wounds in the same area we've been using Hydrofera Blue under 3 layer compression 12/23/17 left anterior tibia. Not close to close this week apparently the [facility] was stuck to this again. Still circumferential eschar requiring debridement. I put a contact layer on this this time under the Hydrofera Blue 12/31/17; left anterior tibia. Wound is better slight amount of hyper-granulation. Using Hydrofera Blue over Adaptic. 01/07/18; left anterior tibia. The wound had some surface eschar however after this was removed he has no open wound.he was already revascularized by Dr. Jacinto Halim when he came to our clinic with atherectomy of the left SFA and popliteal artery. He was also sent to interventional radiology for venous reflux studies. He was not felt to have significant reflux but certainly has chronic venous changes of his skin with hemosiderin deposition around this area. He will definitely need to lubricate his skin and wear compression stocking and I've talked to him about this. READMISSION 05/26/2018 This is a now 72 year old man we cared for with traumatic wounds on his left anterior lower extremity. He had been previously revascularized during that admission by Dr. Jacinto Halim. Apparently in follow-up Dr. Jacinto Halim  noted that he had deterioration in his arterial status. He underwent a stent placement in the distal left SFA on 04/22/2018. 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 He went back to the  angiography suite on 04/30/2018 he underwent PTA and balloon angioplasty of the occluded left mid anterior tibial artery, thrombotic occlusion went from 100 to 0% which reconstitutes the posterior tibial artery. He had thrombectomy and aspiration of the peroneal artery. The stent placed in the distal SFA left SFA was still occluded. 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 He has been recuperating at [facility] in [facility] after the most recent hospitalization. He is going home tomorrow. He tells me that 2 weeks ago he traumatized the tip of his left fifth toe. He came in urgently for our review of this. This was a history of before I noted that Dr. Jacinto Halim had already noted dry gangrenous changes of the left fifth toe 06/09/2018; 2-week follow-up. I did contact Dr. Jacinto Halim after his last appointment and he apparently saw 1 of Dr. Verl Dicker colleagues the next day. He does not follow-up with Dr. Jacinto Halim himself until Thursday of this week. 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 Nevertheless there is no evidence of infection no drainage and no pain. He had a new area that this week when we were signing him in today on the left anterior mid tibia area, this is in close proximity to the previous wound we have dealt with in this clinic. 06/23/2018; 2-week follow-up. I did not receive a recent note from Dr. Jacinto Halim to review today. Our office is trying to obtain this. He is apparently not planning to do further vascular interventions and wondered about compression to try and help with the patient's chronic venous insufficiency. However we are also concerned about the arterial flow. ooHe arrives in clinic today with a new area on the  left third toe. The areas on the calf/anterior tibia are close to closing. The left fifth toe is still mummified using Betadine. -In reviewing things with the patient he has what sounds like claudication with mild to moderate amount of activity. 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 I prescribed Levaquin over the phone while we attempted to arrange a plan of care. However the patient called yesterday to report he had low-grade fever and he came in today acutely. There is been a marked deterioration in the left third toe with spreading cellulitis up into the dorsal left foot. He was referred to the emergency room. Readmission: 06/29/2020 patient presents today for reevaluation here in our clinic he was previously treated by Dr. Leanord Hawking at the latter part of 2019 in 2 the beginning of 2020. Subsequently we have not seen him since that time in the interim he did have evaluation with vein and vascular specialist specifically Dr. Bo Mcclintock who did perform quite extensive work for a left femoral to anterior tibial artery bypass. With that being said in the interim the patient has developed significant lymphedema and has wounds that he tells me have really never healed in regard to the incision site on the left leg. He also has multiple wounds on the feet for  various reasons some of which is that he tends to pick at his feet. Fortunately there is no signs of active infection systemically at this time he does have some wounds that are little bit deeper but most are fairly superficial he seems to have good blood flow and overall everything appears to be healthy I see no bone exposed and no obvious signs of osteomyelitis. I do not know that he necessarily needs a x-ray at this point although that something we could consider depending on how things progress. The patient does have a history of lymphedema, diabetes, this is type II, chronic kidney disease stage III,  hypertension, and history of peripheral vascular disease. 07/05/2020; patient admitted last week. Is a patient I remember from 2019 he had a spreading infection involving the left foot and we sent him to the hospital. 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 He subsequently had a left femoral to anterior tibial bypass by Dr.Cain vein and vascular. He also has severe lymphedema with chronic skin changes related to that on the left leg. The most problematic area that was new today was on the left medial great toe. This was apparently a small area last week there was purulent drainage which our intake nurse cultured. Also areas on the left medial foot and heel left lateral foot. He has 2 areas on the left medial calf left lateral calf in the setting of the severe lymphedema. 07/13/2020 on evaluation today patient appears to be doing better in my opinion compared to his last visit. The good news is there is no signs of active infection systemically and locally I do not see any signs of infection either. He did have an x-ray which was negative that is great news he had a culture which showed MRSA but at the same time he is been on the doxycycline which has helped. I do think we may want to extend this for 7 additional days 1/25; patient admitted to the clinic a few weeks ago. 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 We have been putting him under compression his edema control is a lot better but he is severe verricused skin on the left leg. He is really done quite well he still has an open area on the left medial calf and the left medial first metatarsal head. We have been using silver collagen on the leg silver alginate on the foot 07/27/2020 upon evaluation today patient appears to be doing decently well in regard to his wounds. He still has a lot of dry skin on the left leg. Some of this is starting to peel back and I  think he may be able to have them out by removing some that today. Fortunately there is no signs of active infection at this time on the left leg although on the right leg he does appear to have swelling and erythema as well as some mild warmth to touch. This does have been concerned about the possibility of cellulitis although within the differential diagnosis I do think that potentially a DVT has to be at least considered. We need to rule that out before proceeding would just call in the cellulitis. Especially since he is having pain in the posterior aspect of his calf muscle. 2/8; the patient had seen sparingly. He has severe skin changes of chronic lymphedema in the left leg thickened hyperkeratotic verrucous skin. He has an open wound on  the medial part of the left first met head left mid tibia. He also has a rim of nonepithelialized skin in the anterior mid tibia. He brought in the AmLactin lotion that was been prescribed although I am not sure under compression and its utility. There concern about cellulitis on the right lower leg the last time he was here. He was put on on antibiotics. His DVT rule out was negative. The right leg looks fine he is using his stocking on this area 08/10/2020 upon evaluation today patient appears to be doing well with regard to his leg currently. He has been tolerating the dressing changes without complication. Fortunately there is no signs of active infection which is great news. Overall very pleased with where things stand. 2/22; the patient still has an area on the medial part of the left first met his head. This looks better than when I last saw this earlier this month he has a rim of epithelialization but still some surface debris. Mostly everything on the left leg is healed. There is still a vulnerable in the left mid tibia area. 08/30/2020 upon evaluation today patient appears to be doing much better in regard to his wounds on his foot. Fortunately there does  not appear to be any signs of active infection systemically though locally we did culture this last week and it does appear that he does have MRSA currently. Nonetheless I think we will address that today I Minna send in a prescription for him in that regard. Overall though there does not appear to be any signs of significant worsening. 09/07/2020 on evaluation today patient's wounds over his left foot appear to be doing excellent. I do not see any signs of infection there is some callus buildup this can require debridement for certain but overall I feel like he is managing quite nicely. He still using the AmLactin cream which has been beneficial for him ZAYN, CLEMENCE ([id number]) [id number] Page 13 of 21 as well. 3/22; left foot wound is closed. There is no open area here. He is using ammonium lactate lotion to the lower extremities to help exfoliate dry cracked skin. He has compression stockings from elastic therapy in [place]. The wound on the medial part of his left first met head is healed today. READMISSION 04/12/2021 Mr. Rivadeneira is a patient we know fairly well he had a prolonged stay in clinic in 2019 with wounds on his left lateral and left anterior lower extremity in the setting of chronic venous insufficiency. More recently he was here earlier this year with predominantly an area on his left foot first metatarsal head plantar and he says the plantar foot broke down on its not long after we discharged him but he did not come back here. The last few months areas of broken down on his left anterior and again the left lateral lower extremity. The leg itself is very swollen chronically enlarged a lot of hyperkeratotic dry Berry Q skin in the left lower leg. His edema extends well into the thigh. He was seen by Dr. Randie Heinz. He had ABIs on 03/02/2021 showing an ABI on the right of 1 with a TBI of 0.72 his ABI in the left at 1.09 TBI of 0.99. Monophasic and biphasic  waveforms on the right. On the left monophasic waveforms were noted he went on to have an angiogram on 03/27/2021 this showed the aortic aortic and iliac segments were free of flow-limiting stenosis the left common femoral vein to evaluate the left femoral  to anterior tibial artery bypass was unobstructed the bypass was patent without any areas of stenosis. We discharged the patient in bilateral juxta lite stockings but very clearly that was not sufficient to control the swelling and maintain skin integrity. He is clearly going to need compression pumps. 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 This is fortunate because he is on his feet for long periods of time. 10/27; patient comes in with our intake nurse reporting copious amount of green drainage from the left anterior mid tibia the left dorsal foot and to a lesser extent the left medial mid tibia. We left the compression wrap on all week for the amount of edema in his left leg is quite a bit better. We use silver alginate as the primary dressing 11/3; edema control is good. Left anterior lower leg left medial lower leg and the plantar first metatarsal head. The left anterior lower leg required debridement. Deep tissue culture I did of this wound showed MRSA I put him on 10 days of doxycycline which she will start today. We have him in compression wraps. He has a security card and [employer] however he is retiring on November 15. We will need to then get him into a better offloading boot for the left foot perhaps a total contact cast 11/10; edema control is quite good. Left anterior and left medial lower leg wounds in the setting of chronic venous insufficiency and lymphedema. He also has a substantial area over the left plantar first metatarsal head. 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 He has  significant hypergranulation on the left plantar foot wound. The patient is a diabetic but he does not have significant PAD 11/17; edema control is quite good. Left anterior and left medial lower leg wounds look better. The really concerning area remains the area on the left plantar first metatarsal head. He has a rim of epithelialization. 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 Starting today with a forefoot off loader but . possibly a total contact cast. 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 Arrives with a very significant odor from I think all of the wounds on the left leg and the left foot. Because of this I did not put a total contact cast on him today but will could still consider this. His wife was having cataract surgery which is the reason he missed the appointment 12/6. I saw this man 5 days ago with a swelling below the popliteal fossa. I thought he actually might have a Baker's cyst however the DVT rule out study that we could arrange right away was negative the technician told me this was not a ruptured Baker's cyst. We attempted to get this aspirated by under ultrasound guidance in interventional radiology however all they did was an ultrasound however it shows an extensive fluid collection 62 x 8 x 9.4 in the left thigh and left calf. The patient states he thinks this started 8 days ago or so but he really is not complaining of any pain, fever or systemic symptoms. He  has not ha 12/20; after some difficulty I managed to get the patient into see Dr. Randie Heinz. Eventually he was taken into the hospital and had a drain put in the fluid collection below his left knee posteriorly extending into the posterior thigh. He still has the drain in place. Culture of this showed  moderate staff aureus few Morganella and few Klebsiella he is now on doxycycline and ciprofloxacin as suggested by infectious disease he is on this for a month. The drain will remain in place until it stops draining 12/29; he comes in today with the 1 wound on his left leg and the area on the left plantar first met head significantly smaller. Both look healthy. He still has the drain in the left leg. He says he has to change this daily. Follows up with Dr. Randie Heinz on January 11. 06/29/2021; the wounds that I am following on the left leg and left first met head continued to be quite healthy. However the area where his inferior drain is in place had copious amounts of drainage which was green in color. The wound here is larger. Follows up with Dr. Pascal Lux of vein and vascular his surgeon next week as well as infectious disease. He remains on ciprofloxacin and doxycycline. He is not complaining of excessive pain in either one of the drain areas 1/12; the patient saw vascular surgery and infectious disease. Vascular surgery has left the drain in place as there was still some notable drainage still see him back in 2 weeks. Dr. Dorthula Perfect stop the doxycycline and ciprofloxacin and I do not believe he follows up with them at this point. Culture I did last week showed both doxycycline resistant MRSA and Pseudomonas not sensitive to ciprofloxacin although only in rare titers 1/19; the patient's wound on the left anterior lower leg is just about healed. We have continued healing of the area that was medially on the left leg. Left first plantar metatarsal head continues to get smaller. The major problem here is his 2 drain sites 1 on the left upper calf and lateral thigh. There is purulent drainage still from the left lateral thigh. I gave him antibiotics last week but we still have recultured. He has the drain in the area I think this is eventually going to have to come out. I suspect there will be a connecting wound to  heal here perhaps with improved VAc 1/26; the patient had his drain removed by vein and vascular on 1/25/. This was a large pocket of fluid in his left thigh that seem to tunnel into his left upper calf. 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 His mention his Penrose drain was removed today. He now has a tunneling wound on his left calf and left thigh. Both of these probe widely towards each other although I cannot really prove that they connect. Both wounds on his lower leg anteriorly are closed and his area over the first metatarsal head on his right foot continues to improve. We are using Hydrofera Blue here. He also saw infectious disease culture of the abscess they noted was polymicrobial with MRSA, Morganella and Klebsiella he was treated with doxycycline and ciprofloxacin for 4 weeks ending on 07/03/2021. They did not recommend any further antibiotics. Notable that while he still had the Penrose drain in place last week he had purulent drainage coming out of the inferior IandD site this grew [facility], MRSA and Pseudomonas but there does not appear to be any active  infection in this area today with the drain out and he is not systemically unwell 2/2; with regards to the drain sites the superior one on the thigh actually is closed down the one on the upper left lateral calf measures about 8 and half centimeters which is an improvement seems to be less prominent although still with a lot of drainage. The only remaining wound is over the first metatarsal head on the left foot and this looks to be continuing to improve with Hydrofera Blue. 2/9; the area on his plantar left foot continues to contract. Callus around the wound edge. The drain sites specifically have not come down in depth. We put the wound VAC on Monday he changed the canister late last night our intake nurse reported a pocket of fluid perhaps caused by our compression wraps 2/16; continued improvement in left  foot plantar wound. drainage site in the calf is not improved in terms of depth (wound vac) ROMEN, SPINNER ([id number]) [id number] Page 14 of 21 2/23; continued improvement in the left foot wound over the first metatarsal head. With regards to the drain sites the area on his thigh laterally is healed however the open area on his calf is small in terms of circumference by still probes in by about 15 cm. Within using the wound VAC. Hydrofera Blue on his foot 08/24/2021: The left first metatarsal head wound continues to improve. The wound bed is healthy with just some surrounding callus. Unfortunately the open drain site on his calf remains open and tunnels at least 15 cm (the extent of a Q-tip). This is despite several weeks of wound VAC treatment. Based on reading back through the notes, there has been really no significant change in the depth of the wound, although the orifice is smaller and the more cranial wound on his thigh has closed. I suspect the tunnel tracks nearly all the way to this location. 08/31/2021: Continued improvement in the left first metatarsal head wound. There has been absolutely no improvement to the long tunnel from his open drain site on his calf. We have tried to get him into see vascular surgery sooner to consider the possibility of simply filleting the tract open and allowing it to heal from the bottom up, likely with a wound VAC. They have not yet scheduled a sooner appointment than his current mid April 09/14/2021: He was seen by vascular surgery and they took him to the operating room last week. They opened a portion of the tunnel, but did not extend the entire length of the known open subcutaneous tract. I read Dr. Darcella Cheshire operative note and it is not clear from that documentation why only a portion of the tract was opened. The heaped up granulation tissue was curetted and removed from at least some portion of the tract. They did place a wound VAC  and applied an Unna boot to the leg. The ulcer on his left first metatarsal head is smaller today. The bed looks good and there is just a small amount of surrounding callus. 09/21/2021: The ulcer on his left first metatarsal head looks to be stalled. There is some callus surrounding the wound but the wound bed itself does not appear particularly dynamic. The tunnel tract on his lateral left leg seems to be roughly the same length or perhaps slightly smaller but the wound bed appears healthy with good granulation tissue. He opened up a new wound on his medial thigh and the site of a prior surgical incision. He says  that he did this unconsciously in his sleep by scratching. 09/28/2021: Unfortunately, the ulcer on his left first metatarsal head has extended underneath the callus toward the dorsum of his foot. The medial thigh wounds are roughly the same. The tunnel on his lateral left leg continues to be problematic; it is longer than we are able to actually probe with a Q-tip. I am still not certain as to why Dr. Randie Heinz did not open this up entirely when he took the patient to the operating room. We will likely be back in the same situation with just a small superficial opening in a long unhealed tract, as the open portion is granulating in nicely. 10/02/2021: The patient was initially scheduled for a nurse visit, but we are also applying a total contact cast today. The plantar foot wound looks clean without significant accumulated callus. We have been applying Prisma silver collagen to the site. 10/05/2021: The patient is here for his first total contact cast change. We have tried using gauze packing strips in the tunnel on his lateral leg wound, but this does not seem to be working any better than the white VAC foam. The foot ulcer looks about the same with minimal periwound callus. Medial thigh wound is clean with just some overlying eschar. 10/12/2021: The plantar foot wound is stable without any  significant accumulation of periwound callus. The surface is viable with good granulation tissue. The medial thigh wounds are much smaller and are epithelializing. On the other hand, he had purulent drainage coming from the tunnel on his lateral leg. He does go back to see Dr. Randie Heinz next week and is planning to ask him why the wound tunnel was not completely opened at the time of his most recent operation. 10/19/2021: The plantar foot wound is markedly improved and has epithelial tissue coming through the surface. The medial thigh wounds are nearly closed with just a tiny open area. He did see Dr. Randie Heinz earlier this week and apparently they did discuss the possibility of opening the sinus tract further and enabling a wound VAC application. Apparently there are some limits as to what Dr. 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 I think if we could get the tract open to the level of the popliteal fossa, this would greatly aid in her ability to get this chart closed. That being said, however, today when I probed the tract with a Q-tip, I was not able to insert the entirety of the Q-tip as I have on previous occasions. The tunnel is shorter by about 4 cm. The surface is clean with good granulation tissue and no further episodes of purulent drainage. 10/30/2021: Last week, the patient underwent surgery and had the long tract in his leg opened. There was a rind that was debrided, according to the operative report. His medial thigh ulcers are closed. The plantar foot wound is clean with a good surface and some built up surrounding callus. 11/06/2021: The overall dimensions of the large wound on his lateral leg remain about the same, but there is good granulation tissue present and the tunneling is a little bit shorter. He has a new wound on his anterior tibial surface, in the same location where he had a similar lesion in the past. The plantar foot wound is clean with some  buildup surrounding callus. Just toward the medial aspect of his foot, however, there is an area of darkening that once debrided, revealed another opening in the skin surface. 11/13/2021: The  anterior tibial surface wound is closed. The plantar foot wound has some surrounding callus buildup. The area of darkening that I debrided last week and revealed an opening in the skin surface has closed again. The tunnel in the large wound on his lateral leg has come in by about 3 cm. There is healthy granulation tissue on the entire wound surface. 11/23/2021: The patient was out of town last week and did wet-to-dry dressings on his large wound. 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 Unfortunately, he picked open the wound on his left medial thigh. He says that it was itching and he just could not stop scratching it until it was open again. The wound on his plantar foot is smaller and has not accumulated a tremendous amount of callus. The lateral leg wound is shallower and the tunnel has also decreased in depth. There is just a little bit of slough accumulation on the surface. 11/30/2021: Another portion of his left medial thigh has been opened up. All of these wounds are fairly superficial with just a little bit of slough and eschar accumulation. The wound on his plantar foot is almost closed with just a bit of eschar and periwound callus accumulation. The lateral leg wound is nearly flush with the surrounding skin and the tunnel is markedly shallower. 12/07/2021: There is just 1 open area on his left medial thigh. It is clean with just a little bit of perimeter eschar. The wound on his plantar foot continues to contract and just has some eschar and periwound callus accumulation. The lateral leg wound is closing at the more distal aspect and the tunnel is smaller. The surface is nearly flush with the surrounding skin and it has a good bed of  granulation tissue. 12/14/2021: The thigh and foot wounds are closed. The lateral leg wound has closed over approximately half of its length. The tunnel continues to contract and the surface is now flush with the surrounding skin. The wound bed has robust granulation tissue. 12/22/2021: The thigh and foot wounds have reopened. The foot wound has a lot of callus accumulation around and over it. The thigh wound is tiny with just a little bit of slough in the wound bed. The lateral leg wound continues to contract. His vascular surgeon took the wound VAC off earlier in the week and the patient has been doing wet-to-dry dressings. There is a little slough accumulation on the surface. The tunnel is about 3 cm in depth at this point. 12/28/2021: The thigh wound is closed again. The foot wound has some callus that subsequently has peeled back exposing just a small slit of a wound. The lateral leg wound Is down to about half the size that it originally was and the tunnel is down to about half a centimeter in depth. 01/04/2022: The thigh wound remains closed. The foot wound has heavy callus overlying the wound site. Once this was debrided, the wound was found to be closed. The lateral leg wound is smaller again this week and very superficial. No tunnel could be identified. 01/12/2022: The thigh and foot wounds both remain closed. The lateral leg wound is now nearly flush with the skin surface. There is good granulation tissue present with a light layer of slough. 01/19/2022: Due to the way his wrap was placed, the patient did not change the dressing on his thigh at all and so the foam was saturated and his skin is macerated.  There is a light layer of slough on the wound surface. The underlying granulation tissue is robust and healthy-appearing. He has heavy callus buildup at the site of his first metatarsal head wound which is still healed. 02/01/2022: He has been in silver alginate. When he removed the dressing from  his thigh wound, however, some leg, superficially reopening a portion of the BLAKELEE, FULCO ([id number]) [id number] Page 15 of 21 wound that had healed. In addition, underneath the callus at his left first metatarsal head, there appears to be a blister and the wound appears to be open again. 02/08/2022: The lateral leg wound has contracted substantially. There is eschar and a light layer of slough present. He says that it is starting to pull and is uncomfortable. On inspection, there is some puckering of the scar and the eschar is quite dry; this may account for his symptoms. On his first metatarsal head, the wound is much smaller with just some eschar on the surface. The callus has not reaccumulated. He reports that he had a blister come up on his medial thigh wound at the distal aspect. It popped and there is now an opening in his skin again. Looking back through his library of wound photos, there is what looks like a permanent suture just deep to this location and it may be trying to erode through. We have been using silver alginate on his wounds. 02/15/2022: The lateral leg wound is about half the size it was last week. It is clean with just a little perimeter eschar and light slough. The wound on his first metatarsal head is about the same with heavy callus overlying it. The medial thigh wound is closed again. He does have some skin changes on the top of his foot that looks potentially yeast related. 02/22/2022: The skin on the top of his foot improved with the use of a topical antifungal. The lateral leg wound continues to contract and is again smaller this week. There is a little bit of slough and eschar on the surface. The first metatarsal head wound is a little bit smaller but has reaccumulated a thick callus over the top. He decided to try to trim his toenail and ultimately took the entire nail off of his left great toe. 03/02/2022: His lateral leg wound continues to  improve, as does the wound on his left great toe. Unfortunately, it appears that somehow his foot got wet and moisture seeped in through the opening causing his skin to lift. There is a large wound now overlying his first metatarsal on both the plantar, medial, and dorsal portion of his foot. There is necrotic tissue and slough present underneath the shaggy macerated skin. 03/08/2022: The lateral leg wound is smaller again today. There is just a light layer of slough and eschar on the surface. The great toe wound is smaller again today. The first metatarsal wound is a little bit smaller today and does not look nearly as necrotic and macerated. There is still slough and nonviable tissue present. 03/15/2022: The lateral leg wound is narrower and just has a little bit of light slough buildup. The first metatarsal wound still has a fair amount of moisture affecting the periwound skin. The great toe wound is healed. 03/22/2022: The lateral leg wound is now isolated to just at the level of his knee. There is some eschar and slough accumulation. The first metatarsal head wound has epithelialized tremendously and is about half the size that it was last week. He still has  some maceration on the top of his foot and a fungal odor is present. 03/29/2022: T oday the patient's foot was macerated, suggesting that the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

## 2022-11-02 ENCOUNTER — Other Ambulatory Visit: Payer: Self-pay

## 2022-11-02 DIAGNOSIS — I70229 Atherosclerosis of native arteries of extremities with rest pain, unspecified extremity: Secondary | ICD-10-CM

## 2022-11-02 DIAGNOSIS — I739 Peripheral vascular disease, unspecified: Secondary | ICD-10-CM

## 2022-11-08 ENCOUNTER — Encounter (HOSPITAL_BASED_OUTPATIENT_CLINIC_OR_DEPARTMENT_OTHER): Payer: Medicare Other | Admitting: General Surgery

## 2022-11-08 DIAGNOSIS — E1151 Type 2 diabetes mellitus with diabetic peripheral angiopathy without gangrene: Secondary | ICD-10-CM | POA: Diagnosis not present

## 2022-11-15 ENCOUNTER — Encounter (HOSPITAL_BASED_OUTPATIENT_CLINIC_OR_DEPARTMENT_OTHER): Payer: Medicare Other | Admitting: General Surgery

## 2022-11-15 DIAGNOSIS — E1151 Type 2 diabetes mellitus with diabetic peripheral angiopathy without gangrene: Secondary | ICD-10-CM | POA: Diagnosis not present

## 2022-11-22 ENCOUNTER — Encounter (HOSPITAL_BASED_OUTPATIENT_CLINIC_OR_DEPARTMENT_OTHER): Payer: Medicare Other | Admitting: General Surgery

## 2022-11-22 DIAGNOSIS — E1151 Type 2 diabetes mellitus with diabetic peripheral angiopathy without gangrene: Secondary | ICD-10-CM | POA: Diagnosis not present

## 2022-11-29 ENCOUNTER — Encounter (HOSPITAL_BASED_OUTPATIENT_CLINIC_OR_DEPARTMENT_OTHER): Payer: Medicare Other | Attending: General Surgery | Admitting: General Surgery

## 2022-11-29 DIAGNOSIS — E1122 Type 2 diabetes mellitus with diabetic chronic kidney disease: Secondary | ICD-10-CM | POA: Diagnosis not present

## 2022-11-29 DIAGNOSIS — H409 Unspecified glaucoma: Secondary | ICD-10-CM | POA: Insufficient documentation

## 2022-11-29 DIAGNOSIS — I872 Venous insufficiency (chronic) (peripheral): Secondary | ICD-10-CM | POA: Insufficient documentation

## 2022-11-29 DIAGNOSIS — Z833 Family history of diabetes mellitus: Secondary | ICD-10-CM | POA: Diagnosis not present

## 2022-11-29 DIAGNOSIS — L97828 Non-pressure chronic ulcer of other part of left lower leg with other specified severity: Secondary | ICD-10-CM | POA: Diagnosis not present

## 2022-11-29 DIAGNOSIS — E11621 Type 2 diabetes mellitus with foot ulcer: Secondary | ICD-10-CM | POA: Diagnosis present

## 2022-11-29 DIAGNOSIS — I890 Lymphedema, not elsewhere classified: Secondary | ICD-10-CM | POA: Diagnosis not present

## 2022-11-29 DIAGNOSIS — I129 Hypertensive chronic kidney disease with stage 1 through stage 4 chronic kidney disease, or unspecified chronic kidney disease: Secondary | ICD-10-CM | POA: Insufficient documentation

## 2022-11-29 DIAGNOSIS — M1990 Unspecified osteoarthritis, unspecified site: Secondary | ICD-10-CM | POA: Insufficient documentation

## 2022-11-29 DIAGNOSIS — E1151 Type 2 diabetes mellitus with diabetic peripheral angiopathy without gangrene: Secondary | ICD-10-CM | POA: Insufficient documentation

## 2022-11-29 DIAGNOSIS — L97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: Secondary | ICD-10-CM | POA: Diagnosis not present

## 2022-11-29 DIAGNOSIS — N183 Chronic kidney disease, stage 3 unspecified: Secondary | ICD-10-CM | POA: Diagnosis not present

## 2022-11-29 DIAGNOSIS — E1140 Type 2 diabetes mellitus with diabetic neuropathy, unspecified: Secondary | ICD-10-CM | POA: Insufficient documentation

## 2022-11-29 DIAGNOSIS — Z794 Long term (current) use of insulin: Secondary | ICD-10-CM | POA: Diagnosis not present

## 2022-11-29 DIAGNOSIS — G4730 Sleep apnea, unspecified: Secondary | ICD-10-CM | POA: Insufficient documentation

## 2022-11-29 DIAGNOSIS — L97528 Non-pressure chronic ulcer of other part of left foot with other specified severity: Secondary | ICD-10-CM | POA: Insufficient documentation

## 2022-11-29 DIAGNOSIS — M109 Gout, unspecified: Secondary | ICD-10-CM | POA: Insufficient documentation

## 2022-12-06 ENCOUNTER — Encounter (HOSPITAL_BASED_OUTPATIENT_CLINIC_OR_DEPARTMENT_OTHER): Payer: Medicare Other | Admitting: General Surgery

## 2022-12-06 DIAGNOSIS — E11621 Type 2 diabetes mellitus with foot ulcer: Secondary | ICD-10-CM | POA: Diagnosis not present

## 2022-12-08 NOTE — Progress Notes (Signed)
DORLAND, WOLBERS ([id number]) [id number] Page 1 of 10 Visit Report for 12/06/2022 Arrival Information Details Patient Name: Date of Service: [name], [name] 12/06/2022 10:00 A M Medical Record Number: [id number] Patient Account Number: [id number] Date of Birth/Sex: Treating RN: Sep 02, 1950 (72 y.o. M) Primary Care Jaeleigh Monaco: [name] Other Clinician: Referring Dyland Panuco: Treating Sidonia Nutter/Extender: [name] in Treatment: 82 Visit Information History Since Last Visit All ordered tests and consults were completed: No Patient Arrived: Ambulatory Added or deleted any medications: No Arrival Time: 10:02 Any new allergies or adverse reactions: No Accompanied By: self Had a fall or experienced change in No Transfer Assistance: None activities of daily living that may affect Patient Identification Verified: Yes risk of falls: Secondary Verification Process Completed: Yes Signs or symptoms of abuse/neglect since last visito No Patient Requires Transmission-Based Precautions: No Hospitalized since last visit: No Patient Has Alerts: Yes Implantable device outside of the clinic excluding No cellular tissue based products placed in the center since last visit: Pain Present Now: No Electronic Signature(s) Signed: 12/06/2022 12:58:05 PM By: Dayton Scrape Entered By: Dayton Scrape on 12/06/2022 10:03:24 -------------------------------------------------------------------------------- Compression Therapy Details Patient Name: Date of Service: [name]. 12/06/2022 10:00 A M Medical Record Number: [id number] Patient Account Number: [id number] Date of Birth/Sex: Treating RN: 03-30-1951 (72 y.o. [name] Primary Care Sherley Mckenney: [name] Other Clinician: Referring Indiyah Paone: Treating Kymora Sciara/Extender: [name] in Treatment: 08 Compression Therapy Performed for Wound Assessment: Wound #18R Left,Plantar  Metatarsal head first Performed By: Clinician [name], RN Compression Type: Double Layer Post Procedure Diagnosis Same as Pre-procedure Electronic Signature(s) Signed: 12/06/2022 3:49:47 PM By: Gelene Mink By: [name] on 12/06/2022 10:26:06 [name] ([id number]) [id number] Page 2 of 10 -------------------------------------------------------------------------------- Encounter Discharge Information Details Patient Name: Date of Service: [name], [name] 12/06/2022 10:00 A M Medical Record Number: [id number] Patient Account Number: [id number] Date of Birth/Sex: Treating RN: 11/11/1950 (72 y.o. [name] Primary Care Kielan Dreisbach: [name] Other Clinician: Referring Altamese Deguire: Treating Campbell Kray/Extender: [name] in Treatment: 61 Encounter Discharge Information Items Post Procedure Vitals Discharge Condition: Stable Temperature (F): 98 Ambulatory Status: Ambulatory Pulse (bpm): 71 Discharge Destination: Home Respiratory Rate (breaths/min): 20 Transportation: Private Auto Blood Pressure (mmHg): 122/88 Accompanied By: self Schedule Follow-up Appointment: Yes Clinical Summary of Care: Patient Declined Electronic Signature(s) Signed: 12/06/2022 3:49:47 PM By: [name] Entered By: [name] on 12/06/2022 12:38:25 -------------------------------------------------------------------------------- Lower Extremity Assessment Details Patient Name: Date of Service: [name]. 12/06/2022 10:00 A M Medical Record Number: [id number] Patient Account Number: [id number] Date of Birth/Sex: Treating RN: 1951/01/31 (72 y.o. [name] Primary Care Beren Yniguez: [name] Other Clinician: Referring Janina Trafton: Treating Shereta Crothers/Extender: [name] in Treatment: 86 Edema Assessment Assessed: [name]: No] [Right: No] Edema: [Left: Ye] [Right:  s] Calf Left: Right: Point of Measurement: 41 cm From Medial Instep 43 cm Ankle Left: Right: Point of Measurement: 10 cm From Medial Instep 28.4 cm Vascular Assessment Pulses: Dorsalis Pedis Palpable: [Left:Yes] Electronic Signature(s) Signed: 12/06/2022 3:49:47 PM By: [name] Entered By: [name] on 12/06/2022 10:16:15 Multi Wound Chart Details -------------------------------------------------------------------------------- [name] ([id number]) [id number] Page 3 of 10 Patient Name: Date of Service: [name], [name] 12/06/2022 10:00 A M Medical Record Number: [id number] Patient Account Number: [id number] Date of Birth/Sex: Treating RN: 19-Dec-1950 (72 y.o. 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 Primary Etiology: Extremity Extremity Glaucoma, Sleep Apnea, Glaucoma, Sleep Apnea, Glaucoma, Sleep Apnea, Comorbid History: Hypertension, Peripheral Arterial Hypertension, Peripheral Arterial Hypertension, Peripheral Arterial Disease, Peripheral Venous Disease, Disease, Peripheral Venous Disease, Disease, Peripheral Venous Disease, Type II Diabetes, Gout, Osteoarthritis, Type II Diabetes, Gout, Osteoarthritis, Type II Diabetes, Gout, Osteoarthritis, Neuropathy Neuropathy Neuropathy 08/23/2020 06/03/2021  11/22/2022 Date Acquired: 20 78 2 Weeks of Treatment: Open Open Healed - Epithelialized Wound Status: Yes No No Wound Recurrence: No Yes Yes Clustered Wound: N/A 3 3 Clustered Quantity: 1x1.8x0.2 1x0.9x0.1 0x0x0 Measurements L x W x D (cm) 1.414 0.707 0 A (cm) : rea 0.283 0.071 0 Volume (cm) : 90.00% 57.10% 100.00% % Reduction in A rea: 90.00% 94.60% 100.00% % Reduction in Volume: Grade 2 Full Thickness With Exposed Support Grade 1 Classification: Structures Medium Medium None Present Exudate A mount: Serosanguineous Serous N/A Exudate Type: red, brown amber N/A Exudate Color: Thickened Fibrotic scar, thickened scar Distinct, outline attached Wound Margin: Medium (34-66%) Large (67-100%) None Present (0%) Granulation A mount: Red Red N/A Granulation Quality: Medium (34-66%) Small (1-33%) None Present (0%) Necrotic A mount: Eschar, Adherent Slough Eschar, Adherent Slough N/A Necrotic Tissue: Fat Layer (Subcutaneous Tissue): Yes Fat Layer (Subcutaneous Tissue): Yes Fascia: No Exposed Structures: Fascia: No Fascia: No Fat Layer (Subcutaneous Tissue): No Tendon: No Tendon: No Tendon: No Muscle: No Muscle: No Muscle: No Joint: No Joint: No Joint: No Bone: No Bone: No Bone: No Small (1-33%) Large (67-100%) Large (67-100%) Epithelialization: Debridement - Selective/Open Wound Debridement - Selective/Open Wound N/A Debridement: Pre-procedure Verification/Time Out 10:23 10:23 N/A Taken: Lidocaine 4% T opical Solution Lidocaine 4% Topical Solution N/A Pain Control: Callus, Slough Necrotic/Eschar, Slough N/A Tissue Debrided: Skin/Epidermis Non-Viable Tissue N/A Level: 1.41 0.71 N/A Debridement A (sq cm): rea Curette Curette N/A Instrument: Minimum Minimum N/A Bleeding: Pressure Pressure N/A Hemostasis A chieved: Procedure was tolerated well Procedure was tolerated well N/A Debridement Treatment Response: 1x1.8x0.2 1x0.9x0.1 N/A Post Debridement  Measurements L x W x D (cm) 0.283 0.071 N/A Post Debridement Volume: (cm) Callus: Yes Scarring: Yes No Abnormalities Noted Periwound Skin Texture: Maceration: Yes Dry/Scaly: Yes Dry/Scaly: Yes Periwound Skin Moisture: Dry/Scaly: No No Abnormalities Noted Hemosiderin Staining: Yes Hemosiderin Staining: Yes Periwound Skin Color: No Abnormality No Abnormality No Abnormality Temperature: Compression Therapy Debridement N/A Procedures Performed: OSHANE, BORUNDA ([id number]) [id number] Page 4 of 10 Debridement Treatment Notes Electronic Signature(s) Signed: 12/06/2022 10:46:39 AM By: [name] MD FACS Entered By: [name] on 12/06/2022 10:46:39 -------------------------------------------------------------------------------- Multi-Disciplinary Care Plan Details Patient Name: Date of Service: [name]. 12/06/2022 10:00 A M Medical Record Number: [id number] Patient Account Number: [id number] Date of Birth/Sex: Treating RN: April 05, 1951 (72 y.o. [name] Primary Care Breyah Akhter: [name] Other Clinician: Referring Duan Scharnhorst: Treating Jemina Scahill/Extender: [name] in Treatment: 54 Multidisciplinary Care Plan reviewed with physician Active Inactive Venous Leg Ulcer Nursing Diagnoses: Knowledge deficit related to disease process and management Potential for venous Insuffiency (use before diagnosis confirmed) Goals: Patient will maintain optimal edema control Date Initiated: 07/27/2021 Target Resolution Date: 02/23/2023 Goal Status: Active Interventions: Assess peripheral edema status every  visit. Treatment Activities: Therapeutic compression applied : 07/27/2021 Notes: Wound/Skin Impairment Nursing Diagnoses: Impaired tissue integrity Knowledge deficit related to ulceration/compromised skin integrity Goals: Patient will have a decrease in wound volume by X% from date: (specify in notes) Date  Initiated: 04/12/2021 Date Inactivated: 01/04/2022 Target Resolution Date: 04/23/2021 Goal Status: Met Patient/caregiver will verbalize understanding of skin care regimen Date Initiated: 01/04/2022 Target Resolution Date: 02/23/2023 Goal Status: Active Ulcer/skin breakdown will have a volume reduction of 30% by week 4 Date Initiated: 04/12/2021 Date Inactivated: 04/27/2021 Target Resolution Date: 04/27/2021 Goal Status: Unmet Unmet Reason: infection Ulcer/skin breakdown will have a volume reduction of 50% by week 8 Date Initiated: 04/27/2021 Date Inactivated: 06/29/2021 Target Resolution Date: 06/24/2021 Goal Status: Met Interventions: Assess patient/caregiver ability to obtain necessary supplies Assess patient/caregiver ability to perform ulcer/skin care regimen upon admission and as needed Assess ulceration(s) every visit Notes: ALP, FAUCETTE ([id number]) [id number] Page 5 of 10 Electronic Signature(s) Signed: 12/06/2022 3:49:47 PM By: [name] Entered By: [name] on 12/06/2022 10:26:46 -------------------------------------------------------------------------------- Pain Assessment Details Patient Name: Date of Service: DICKSON, BARFIELD 12/06/2022 10:00 A M Medical Record Number: [id number] Patient Account Number: [id number] Date of Birth/Sex: Treating RN: [birth year] (72 y.o. M) Primary Care Jamarien Rodkey: [name] Other Clinician: Referring Landrey Mahurin: Treating Berish Bohman/Extender: [name] in Treatment: 36 Active Problems Location of Pain Severity and Description of Pain Patient Has Paino No Site Locations Pain Management and Medication Current Pain Management: Electronic Signature(s) Signed: 12/06/2022 12:58:05 PM By: Dayton Scrape Entered By: Dayton Scrape on 12/06/2022 10:03:53 -------------------------------------------------------------------------------- Patient/Caregiver Education Details Patient Name:  Date of Service: [place], [name] 6/13/2024andnbsp10:00 A M Medical Record Number: [id number] Patient Account Number: [id number] Date of Birth/Gender: Treating RN: 06-Oct-1950 (72 y.o. [name] Primary Care Physician: [name] Other Clinician: Referring Physician: Treating Physician/Extender: [name] in Treatment: 64 Education Assessment Education Provided To: [name] ([id number]) [id number] Page 6 of 10 Patient Education Topics Provided Pressure: Methods: Explain/Verbal Responses: Reinforcements needed, State content correctly Wound/Skin Impairment: Methods: Explain/Verbal Responses: Reinforcements needed, State content correctly Electronic Signature(s) Signed: 12/06/2022 3:49:47 PM By: [name] Entered By: [name] on 12/06/2022 10:27:07 -------------------------------------------------------------------------------- Wound Assessment Details Patient Name: Date of Service: [name]. 12/06/2022 10:00 A M Medical Record Number: [id number] Patient Account Number: [id number] Date of Birth/Sex: Treating RN: July 22, 1950 (72 y.o. M) Primary Care Ellar Hakala: [name] Other Clinician: Referring Tarrell Debes: Treating Lugene Hitt/Extender: [name] in Treatment: 86 Wound Status Wound Number: 18R Primary Diabetic Wound/Ulcer of the Lower Extremity Etiology: Wound Location: Left, Plantar Metatarsal head first Wound Open Wounding Event: Gradually Appeared Status: Date Acquired: 08/23/2020 Comorbid Glaucoma, Sleep Apnea, Hypertension, Peripheral Arterial Disease, Weeks Of Treatment: 86 History: Peripheral Venous Disease, Type II Diabetes, Gout, Osteoarthritis, Clustered Wound: No Neuropathy Photos Wound Measurements Length: (cm) 1 Width: (cm) 1.8 Depth: (cm) 0.2 Area: (cm) 1.414 Volume: (cm) 0.283 % Reduction in Area: 90% % Reduction in Volume:  90% Epithelialization: Small (1-33%) Tunneling: No Undermining: No Wound Description Classification: Grade 2 Wound Margin: Thickened Exudate Amount: Medium Exudate Type: Serosanguineous Exudate Color: red, brown Foul Odor After Cleansing: No Slough/Fibrino Yes Wound Bed Granulation Amount: Medium (34-66%) Exposed Structure Granulation Quality: Red Fascia Exposed: No TAIGEN, GAMBOA ([id number]) [id number] Page 7 of 10 Necrotic Amount: Medium (34-66%) Fat Layer (Subcutaneous Tissue) Exposed: Yes Necrotic Quality: Eschar, Adherent Slough Tendon Exposed: No Muscle Exposed: No Joint Exposed: No Bone Exposed: No Periwound Skin Texture Texture Color No  Abnormalities Noted: No No Abnormalities Noted: Yes Callus: Yes Temperature / Pain Temperature: No Abnormality Moisture No Abnormalities Noted: No Dry / Scaly: No Maceration: Yes Treatment Notes Wound #18R (Metatarsal head first) Wound Laterality: Plantar, Left Cleanser Soap and Water Discharge Instruction: May shower and wash wound with dial antibacterial soap and water prior to dressing change. Wound Cleanser Discharge Instruction: Cleanse the wound with wound cleanser prior to applying a clean dressing using gauze sponges, not tissue or cotton balls. Peri-Wound Care Ketoconazole Cream 2% Discharge Instruction: Apply Ketoconazole as directed Topical Primary Dressing Maxorb Extra Ag+ Alginate Dressing, 2x2 (in/in) Discharge Instruction: Apply to wound bed as instructed Secondary Dressing Optifoam Non-Adhesive Dressing, 4x4 in Discharge Instruction: Apply over primary dressing as directed. Woven Gauze Sponges 2x2 in Discharge Instruction: Apply over primary dressing as directed. Zetuvit Absorbent Pad, 4x4 (in/in) Secured With Compression Wrap Urgo K2, (equivalent to a 4 layer) two layer compression system, regular Discharge Instruction: Apply Urgo K2 as directed (alternative to 4 layer  compression). Compression Stockings Add-Ons Electronic Signature(s) Signed: 12/06/2022 3:49:47 PM By: [name] Entered By: [name] on 12/06/2022 10:17:18 -------------------------------------------------------------------------------- Wound Assessment Details Patient Name: Date of Service: [name], [name] 12/06/2022 10:00 A M Medical Record Number: [id number] Patient Account Number: [id number] Date of Birth/Sex: Treating RN: 1950-08-14 (72 y.o. [name] Primary Care Ailyn Gladd: [name] Other Clinician: Referring Xylan Sheils: Treating Tavish Gettis/Extender: [name] in Treatment: [address] [name], [name] ([id number]) [id number] Page 8 of 10 Wound Number: 22 Primary Cyst Etiology: Wound Location: Left, Proximal, Lateral Lower Leg Wound Open Wounding Event: Bump Status: Date Acquired: 06/03/2021 Comorbid Glaucoma, Sleep Apnea, Hypertension, Peripheral Arterial Disease, Weeks Of Treatment: 78 History: Peripheral Venous Disease, Type II Diabetes, Gout, Osteoarthritis, Clustered Wound: Yes Neuropathy Photos Wound Measurements Length: (cm) Width: (cm) Depth: (cm) Clustered Quantity: Area: (cm) Volume: (cm) 1 % Reduction in Area: 57.1% 0.9 % Reduction in Volume: 94.6% 0.1 Epithelialization: Large (67-100%) 3 Tunneling: No 0.707 Undermining: No 0.071 Wound Description Classification: Full Thickness With Exposed Support Structures Wound Margin: Fibrotic scar, thickened scar Exudate Amount: Medium Exudate Type: Serous Exudate Color: amber Foul Odor After Cleansing: No Slough/Fibrino Yes Wound Bed Granulation Amount: Large (67-100%) Exposed Structure Granulation Quality: Red Fascia Exposed: No Necrotic Amount: Small (1-33%) Fat Layer (Subcutaneous Tissue) Exposed: Yes Necrotic Quality: Eschar, Adherent Slough Tendon Exposed: No Muscle Exposed: No Joint Exposed: No Bone Exposed:  No Periwound Skin Texture Texture Color No Abnormalities Noted: No No Abnormalities Noted: No Scarring: Yes Hemosiderin Staining: Yes Moisture Temperature / Pain No Abnormalities Noted: No Temperature: No Abnormality Dry / Scaly: Yes Treatment Notes Wound #22 (Lower Leg) Wound Laterality: Left, Lateral, Proximal Cleanser Soap and Water Discharge Instruction: May shower and wash wound with dial antibacterial soap and water prior to dressing change. Wound Cleanser Discharge Instruction: Cleanse the wound with wound cleanser prior to applying a clean dressing using gauze sponges, not tissue or cotton balls. Peri-Wound Care Skin Prep Discharge Instruction: Use skin prep as directed Sween Lotion (Moisturizing lotion) Discharge Instruction: Apply moisturizing lotion to the leg Topical Skintegrity Hydrogel 4 (oz) Discharge Instruction: Apply hydrogel as directed SEGER, [name] ([id number]) [id number] Page 9 of 10 Primary Dressing Endoform 2x2 in Discharge Instruction: Moisten with Hydrogel or saline Secondary Dressing Zetuvit Plus 4x8 in Discharge Instruction: Apply over primary dressing as directed. Secured With Elastic Bandage 4 inch (ACE bandage) Discharge Instruction: Secure with ACE bandage as directed. Compression Wrap Compression Stockings Add-Ons Electronic Signature(s) Signed: 12/06/2022 3:49:47  PM By: Gelene Mink By: [name] on 12/06/2022 10:17:46 -------------------------------------------------------------------------------- Wound Assessment Details Patient Name: Date of Service: [name], [name] 12/06/2022 10:00 A M Medical Record Number: [id number] Patient Account Number: [id number] Date of Birth/Sex: Treating RN: [birth year] (72 y.o. [name] Primary Care Daren Yeagle: [name] Other Clinician: Referring Izaias [name]: Treating Vestal Markin/Extender: [name] in Treatment:  86 Wound Status Wound Number: 34 Primary Diabetic Wound/Ulcer of the Lower Extremity Etiology: Wound Location: Left, Anterior Lower Leg Wound Healed - Epithelialized Wounding Event: Skin Tear/Laceration Status: Date Acquired: 11/22/2022 Comorbid Glaucoma, Sleep Apnea, Hypertension, Peripheral Arterial Disease, Weeks Of Treatment: 2 History: Peripheral Venous Disease, Type II Diabetes, Gout, Osteoarthritis, Clustered Wound: Yes Neuropathy Photos Wound Measurements Length: (cm) Width: (cm) Depth: (cm) Clustered Quantity: Area: (cm) Volume: (cm) 0 % Reduction in Area: 100% 0 % Reduction in Volume: 100% 0 Epithelialization: Large (67-100%) 3 Tunneling: No 0 Undermining: No 0 Wound Description Classification: Grade 1 Wound Margin: Distinct, outline attached Exudate Amount: None Present [name], [name] ([id number]) Foul Odor After Cleansing: No Slough/Fibrino No [id number] Page 10 of 10 Wound Bed Granulation Amount: None Present (0%) Exposed Structure Necrotic Amount: None Present (0%) Fascia Exposed: No Fat Layer (Subcutaneous Tissue) Exposed: No Tendon Exposed: No Muscle Exposed: No Joint Exposed: No Bone Exposed: No Periwound Skin Texture Texture Color No Abnormalities Noted: Yes No Abnormalities Noted: No Hemosiderin Staining: Yes Moisture No Abnormalities Noted: No Temperature / Pain Dry / Scaly: Yes Temperature: No Abnormality Treatment Notes Wound #34 (Lower Leg) Wound Laterality: Left, Anterior Cleanser Peri-Wound Care Topical Primary Dressing Secondary Dressing Secured With Compression Wrap Compression Stockings Add-Ons Electronic Signature(s) Signed: 12/06/2022 3:49:47 PM By: [name] Entered By: [name] on 12/06/2022 10:23:18 -------------------------------------------------------------------------------- Vitals Details Patient Name: Date of Service: [name]. 12/06/2022 10:00 A M Medical  Record Number: [id number] Patient Account Number: [id number] Date of Birth/Sex: Treating RN: 1951-04-04 (72 y.o. M) Primary Care Suprina [name]: [name] Other Clinician: Referring Rankin Coolman: Treating Ilo Beamon/Extender: [name] in Treatment: 54 Vital Signs Time Taken: 10:03 Temperature (F): 98.0 Height (in): 74 Pulse (bpm): 71 Weight (lbs): 238 Respiratory Rate (breaths/min): 20 Body Mass Index (BMI): 30.6 Blood Pressure (mmHg): 122/88 Capillary Blood Glucose (mg/dl): 629 Reference Range: 80 - 120 mg / dl Electronic Signature(s) Signed: 12/06/2022 12:58:05 PM By: Dayton Scrape Entered By: Dayton Scrape on 12/06/2022 10:03:46

## 2022-12-08 NOTE — Progress Notes (Signed)
[name], [name] ([id number]) [id number] Page 1 of 21 Visit Report for 12/06/2022 Chief Complaint Document Details Patient Name: Date of Service: [name], [name] 12/06/2022 10:00 A M Medical Record Number: [id number] Patient Account Number: [id number] Date of Birth/Sex: Treating RN: 04-29-1951 (72 y.o. M) Primary Care Provider: [name] Other Clinician: Referring Provider: Treating Provider/Extender: [name] in Treatment: 86 Information Obtained from: Patient Chief Complaint Left leg and foot ulcers 04/12/2021; patient is here for wounds on his left lower leg and left plantar foot over the first metatarsal head Electronic Signature(s) Signed: 12/06/2022 10:47:32 AM By: [name] Guess MD FACS Entered By: [name] on 12/06/2022 10:47:32 -------------------------------------------------------------------------------- Debridement Details Patient Name: Date of Service: [name]. 12/06/2022 10:00 A M Medical Record Number: [id number] Patient Account Number: [id number] Date of Birth/Sex: Treating RN: August 18, 1950 (72 y.o. [name] Primary Care Provider: [name] Other Clinician: Referring Provider: Treating Provider/Extender: [name] in Treatment: 86 Debridement Performed for Assessment: Wound #18R Left,Plantar Metatarsal head first Performed By: Physician [name] Guess, MD Debridement Type: Debridement Severity of Tissue Pre Debridement: Fat layer exposed Level of Consciousness (Pre-procedure): Awake and Alert Pre-procedure Verification/Time Out Yes - 10:23 Taken: Start Time: 10:23 Pain Control: Lidocaine 4% Topical Solution Percent of Wound Bed Debrided: 100% T Area Debrided (cm): otal 1.41 Tissue and other material debrided: Non-Viable, Callus, Slough, Skin: Epidermis, Slough Level: Skin/Epidermis Debridement Description: Selective/Open Wound Instrument:  Curette Bleeding: Minimum Hemostasis Achieved: Pressure Response to Treatment: Procedure was tolerated well Level of Consciousness (Post- Awake and Alert procedure): Post Debridement Measurements of Total Wound Length: (cm) 1 Width: (cm) 1.8 Depth: (cm) 0.2 Volume: (cm) 0.283 Character of Wound/Ulcer Post Debridement: Improved Severity of Tissue Post Debridement: Fat layer exposed [name] ([id number]) [id number] Page 2 of 21 Post Procedure Diagnosis Same as Pre-procedure Notes scribed for Dr. Lady [name] by [name] Bruin, RN Electronic Signature(s) Signed: 12/06/2022 12:40:38 PM By: [name] Guess MD FACS Signed: 12/06/2022 3:49:47 PM By: [name] Entered By: [name] on 12/06/2022 10:25:52 -------------------------------------------------------------------------------- Debridement Details Patient Name: Date of Service: [name]. 12/06/2022 10:00 A M Medical Record Number: [id number] Patient Account Number: [id number] Date of Birth/Sex: Treating RN: [birth year] (72 y.o. [name] Primary Care Provider: [name] Other Clinician: Referring Provider: Treating Provider/Extender: [name] in Treatment: 86 Debridement Performed for Assessment: Wound #22 Left,Proximal,Lateral Lower Leg Performed By: Physician [name] Guess, MD Debridement Type: Debridement Level of Consciousness (Pre-procedure): Awake and Alert Pre-procedure Verification/Time Out Yes - 10:23 Taken: Start Time: 10:23 Pain Control: Lidocaine 4% Topical Solution Percent of Wound Bed Debrided: 100% T Area Debrided (cm): otal 0.71 Tissue and other material debrided: Non-Viable, Eschar, Slough, Slough Level: Non-Viable Tissue Debridement Description: Selective/Open Wound Instrument: Curette Bleeding: Minimum Hemostasis Achieved: Pressure Response to Treatment: Procedure was tolerated well Level of  Consciousness (Post- Awake and Alert procedure): Post Debridement Measurements of Total Wound Length: (cm) 1 Width: (cm) 0.9 Depth: (cm) 0.1 Volume: (cm) 0.071 Character of Wound/Ulcer Post Debridement: Improved Post Procedure Diagnosis Same as Pre-procedure Notes scribed for Dr. Lady [name] by [name] Bruin, RN Electronic Signature(s) Signed: 12/06/2022 12:40:38 PM By: [name] Guess MD FACS Signed: 12/06/2022 3:49:47 PM By: [name] Entered By: [name] on 12/06/2022 10:28:30 [name] ([id number]) [id number] Page 3 of 21 -------------------------------------------------------------------------------- HPI Details Patient Name: Date of Service: [name], [name] 12/06/2022 10:00 A M Medical Record Number: [id number] Patient Account Number: [id number] Date of Birth/Sex: Treating RN:  Oct 01, 1950 (71 y.o. M) Primary Care Provider: [name] Other Clinician: Referring Provider: Treating Provider/Extender: [name] in Treatment: 72 History of Present Illness HPI Description: 10/11/17; [name] is a 72 year old man who tells me that in 2015 he slipped down the latter traumatizing his left leg. He developed a wound in the same spot the area that we are currently looking at. He states this closed over for the most part although he always felt it was somewhat unstable. In 2016 he hit the same area with the door of his car had this reopened. He tells me that this is never really closed although sometimes an inflow it remains open on a constant basis. He has not been using any specific dressing to this except for topical antibiotics the nature of which were not really sure. His primary doctor did send him to see Dr. Jacinto Halim of interventional cardiology. He underwent an angiogram on 08/06/17 and he underwent a PTA and directional atherectomy of the lesser distal SFA and popliteal arteries which resulted in brisk  improvement in blood flow. It was noted that he had 2 vessel runoff through the anterior tibial and peroneal. He is also been to see vascular and interventional radiologist. He was not felt to have any significant superficial venous insufficiency. Presumably is not a candidate for any ablation. It was suggested he come here for wound care. The patient is a type II diabetic on insulin. He also has a history of venous insufficiency. ABIs on the left were noncompressible in our clinic 10/21/17; patient we admitted to the clinic last week. 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 We put Iodosorb on him after an aggressive debridement and 3 layer compression. He complained of pain in his ankle and itching with is skin in fact he scratched the area on the medial calf superiorly at the rim of our wraps and he has 2 small open areas in that location today which are new. I changed his primary dressing today to silver collagen. As noted he is already had revascularization and does not have any significant superficial venous insufficiency that would be amenable to ablation 10/28/17; patient admitted to the clinic 2 weeks ago. He has a smaller Wound. Scratch injury from last week revealed. There is large wound over the tibial area. This is smaller. Granulation looks healthy. No need for debridement. 11/04/17; the wound on the left lateral calf looks better. Improved dimensions. Surface of this looks better. We've been maintaining him and Kerlix Coban wraps. He finds this much more comfortable. Silver collagen dressing 11/11/17; left lateral Wound continues to look healthy be making progress. Using a #5 curet I removed removed nonviable skin from the surface of the wound and then necrotic debris from the wound surface. Surface of the wound continues to look healthy. He also has an open area on the left great toenail bed. We've been using topical  antibiotics. 11/19/17; left anterior lateral wound continues to look healthy but it's not closed. He also had a small wound above this on the left leg Initially traumatic wounds in the setting of significant chronic venous insufficiency and stasis dermatitis 11/25/17; left anterior wounds superiorly is closed still a small wound inferiorly. 12/02/17; left anterior tibial area. Arrives today with adherent callus. Post debridement clearly not completely closed. Hydrofera Blue under 3 layer compression. 12/09/17; left anterior tibia. Circumferential eschar however the wound bed looks stable to improved. We've  been using Hydrofera Blue under 3 layer compression 12/17/17; left anterior tibia. Apparently this was felt to be closed however when the wrap was taken off there is a skin tear to reopen wounds in the same area we've been using Hydrofera Blue under 3 layer compression 12/23/17 left anterior tibia. Not close to close this week apparently the [facility] was stuck to this again. Still circumferential eschar requiring debridement. I put a contact layer on this this time under the Hydrofera Blue 12/31/17; left anterior tibia. Wound is better slight amount of hyper-granulation. Using Hydrofera Blue over Adaptic. 01/07/18; left anterior tibia. The wound had some surface eschar however after this was removed he has no open wound.he was already revascularized by Dr. Jacinto Halim when he came to our clinic with atherectomy of the left SFA and popliteal artery. He was also sent to interventional radiology for venous reflux studies. He was not felt to have significant reflux but certainly has chronic venous changes of his skin with hemosiderin deposition around this area. He will definitely need to lubricate his skin and wear compression stocking and I've talked to him about this. READMISSION 05/26/2018 This is a now 72 year old man we cared for with traumatic wounds on his left anterior lower extremity. He had been  previously revascularized during that admission by Dr. Jacinto Halim. Apparently in follow-up Dr. Jacinto Halim noted that he had deterioration in his arterial status. He underwent a stent placement in the distal left SFA on 04/22/2018. 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 He went back to the angiography suite on 04/30/2018 he underwent PTA and balloon angioplasty of the occluded left mid anterior tibial artery, thrombotic occlusion went from 100 to 0% which reconstitutes the posterior tibial artery. He had thrombectomy and aspiration of the peroneal artery. The stent placed in the distal SFA left SFA was still occluded. 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 He has been recuperating at [facility] home in [facility] after the most recent hospitalization. He is going home tomorrow. He tells me that 2 weeks ago he traumatized the tip of his left fifth toe. He came in urgently for our review of this. This was a history of before I noted that Dr. Jacinto Halim had already noted dry gangrenous changes of the left fifth toe 06/09/2018; 2-week follow-up. I did contact Dr. Jacinto Halim after his last appointment and he apparently saw 1 of Dr. Verl Dicker colleagues the next day. He does not follow-up with Dr. Jacinto Halim himself until Thursday of this week. 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 Nevertheless there is no evidence of infection no drainage and no pain. He had a new area that this week when we were signing him in today on the left anterior mid tibia area, this is in close proximity to the previous wound we have dealt with in this clinic. 06/23/2018; 2-week follow-up. I did not receive a recent note from Dr. Jacinto Halim to review today. Our  office is trying to obtain this. He is apparently not planning to do further vascular interventions and wondered about compression to try and help with the patient's chronic venous insufficiency. However we are also concerned about the arterial flow. He arrives in clinic today with  a new area on the left third toe. The areas on the calf/anterior tibia are close to closing. The left fifth toe is still mummified using Betadine. -In reviewing things with the patient he has what sounds like claudication with mild to moderate amount of activity. 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 I prescribed Levaquin over the phone while we attempted to arrange a plan of care. However the patient called yesterday to report he had low-grade fever and he came in today acutely. There is been a marked deterioration in the left third toe with spreading cellulitis up into the dorsal left foot. He was referred to the emergency room. Readmission: 06/29/2020 patient presents today for reevaluation here in our clinic he was previously treated by Dr. Leanord Hawking at the latter part of 2019 in 2 the beginning of 2020. Subsequently we have not seen him since that time in the interim he did have evaluation with vein and vascular specialist specifically Dr. Bo Mcclintock who did perform quite extensive work for a left femoral to anterior tibial artery bypass. With that being said in the interim the patient has developed significant SIDARTH, GOLON ([id number]) [id number] Page 4 of 21 lymphedema and has wounds that he tells me have really never healed in regard to the incision site on the left leg. He also has multiple wounds on the feet for various reasons some of which is that he tends to pick at his feet. Fortunately there is no signs of active infection systemically at this time he does have some wounds that are little bit deeper but most are fairly superficial he seems to  have good blood flow and overall everything appears to be healthy I see no bone exposed and no obvious signs of osteomyelitis. I do not know that he necessarily needs a x-ray at this point although that something we could consider depending on how things progress. The patient does have a history of lymphedema, diabetes, this is type II, chronic kidney disease stage III, hypertension, and history of peripheral vascular disease. 07/05/2020; patient admitted last week. Is a patient I remember from 2019 he had a spreading infection involving the left foot and we sent him to the hospital. 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 He subsequently had a left femoral to anterior tibial bypass by Dr.Cain vein and vascular. He also has severe lymphedema with chronic skin changes related to that on the left leg. The most problematic area that was new today was on the left medial great toe. This was apparently a small area last week there was purulent drainage which our intake nurse cultured. Also areas on the left medial foot and heel left lateral foot. He has 2 areas on the left medial calf left lateral calf in the setting of the severe lymphedema. 07/13/2020 on evaluation today patient appears to be doing better in my opinion compared to his last visit. The good news is there is no signs of active infection systemically and locally I do not see any signs of infection either. He did have an x-ray which was negative that is great news he had a culture which showed MRSA but at the same time he is been on the doxycycline which has helped. I do think we may want to extend this for 7 additional days 1/25; patient admitted to the clinic a few weeks ago. 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 We have been putting him under compression his edema control is a lot better but he is severe verricused skin on the left leg. He is really  done quite well he still has an open area on the left medial calf and the left medial first metatarsal head. We have been using silver collagen on the leg silver alginate on the foot 07/27/2020 upon evaluation today patient appears to be doing decently well in regard to his wounds. He still has a lot of dry skin on the left leg. Some of this is starting to peel back and I think he may be able to have them out by removing some that today. Fortunately there is no signs of active infection at this time on the left leg although on the right leg he does appear to have swelling and erythema as well as some mild warmth to touch. This does have been concerned about the possibility of cellulitis although within the differential diagnosis I do think that potentially a DVT has to be at least considered. We need to rule that out before proceeding would just call in the cellulitis. Especially since he is having pain in the posterior aspect of his calf muscle. 2/8; the patient had seen sparingly. He has severe skin changes of chronic lymphedema in the left leg thickened hyperkeratotic verrucous skin. He has an open wound on the medial part of the left first met head left mid tibia. He also has a rim of nonepithelialized skin in the anterior mid tibia. He brought in the AmLactin lotion that was been prescribed although I am not sure under compression and its utility. There concern about cellulitis on the right lower leg the last time he was here. He was put on on antibiotics. His DVT rule out was negative. The right leg looks fine he is using his stocking on this area 08/10/2020 upon evaluation today patient appears to be doing well with regard to his leg currently. He has been tolerating the dressing changes without complication. Fortunately there is no signs of active infection which is great news. Overall very pleased with where things stand. 2/22; the patient still has an area on the medial part of the left first  met his head. This looks better than when I last saw this earlier this month he has a rim of epithelialization but still some surface debris. Mostly everything on the left leg is healed. There is still a vulnerable in the left mid tibia area. 08/30/2020 upon evaluation today patient appears to be doing much better in regard to his wounds on his foot. 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 Nonetheless I think we will address that today I Minna send in a prescription for him in that regard. Overall though there does not appear to be any signs of significant worsening. 09/07/2020 on evaluation today patient's wounds over his left foot appear to be doing excellent. I do not see any signs of infection there is some callus buildup this can require debridement for certain but overall I feel like he is managing quite nicely. He still using the AmLactin cream which has been beneficial for him as well. 3/22; left foot wound is closed. There is no open area here. He is using ammonium lactate lotion to the lower extremities to help exfoliate dry cracked skin. He has compression  stockings from elastic therapy in [place]. The wound on the medial part of his left first met head is healed today. READMISSION 04/12/2021 Mr. Dahms is a patient we know fairly well he had a prolonged stay in clinic in 2019 with wounds on his left lateral and left anterior lower extremity in the setting of chronic venous insufficiency. More recently he was here earlier this year with predominantly an area on his left foot first metatarsal head plantar and he says the plantar foot broke down on its not long after we discharged him but he did not come back here. The last few months areas of broken down on his left anterior and again the left lateral lower extremity. The leg itself is very swollen  chronically enlarged a lot of hyperkeratotic dry Berry Q skin in the left lower leg. His edema extends well into the thigh. He was seen by Dr. Randie Heinz. He had ABIs on 03/02/2021 showing an ABI on the right of 1 with a TBI of 0.72 his ABI in the left at 1.09 TBI of 0.99. Monophasic and biphasic waveforms on the right. On the left monophasic waveforms were noted he went on to have an angiogram on 03/27/2021 this showed the aortic aortic and iliac segments were free of flow-limiting stenosis the left common femoral vein to evaluate the left femoral to anterior tibial artery bypass was unobstructed the bypass was patent without any areas of stenosis. We discharged the patient in bilateral juxta lite stockings but very clearly that was not sufficient to control the swelling and maintain skin integrity. He is clearly going to need compression pumps. 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 This is fortunate because he is on his feet for long periods of time. 10/27; patient comes in with our intake nurse reporting copious amount of green drainage from the left anterior mid tibia the left dorsal foot and to a lesser extent the left medial mid tibia. We left the compression wrap on all week for the amount of edema in his left leg is quite a bit better. We use silver alginate as the primary dressing 11/3; edema control is good. Left anterior lower leg left medial lower leg and the plantar first metatarsal head. The left anterior lower leg required debridement. Deep tissue culture I did of this wound showed MRSA I put him on 10 days of doxycycline which she will start today. We have him in compression wraps. He has a security card and [employer] however he is retiring on November 15. We will need to then get him into a better offloading boot for the left foot perhaps a total contact cast 11/10; edema control is quite good. Left anterior and left medial lower leg  wounds in the setting of chronic venous insufficiency and lymphedema. He also has a substantial area over the left plantar first metatarsal head. 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 He has significant hypergranulation on the left plantar foot wound. The patient is a diabetic but he does not have significant PAD 11/17; edema control is quite good. Left anterior and left medial lower leg wounds look better. The really concerning area remains the area on the left plantar first metatarsal head. He has a rim of epithelialization. He has been using a surgical shoe The patient is now retired from a  a [employer] I have gone over with him the need to offload this area aggressively. Starting today with a forefoot off loader but . possibly a total contact cast. 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 Arrives with a very significant odor from I think all of the wounds on the left leg and the left foot. Because of this I did not put a total contact cast on him today but will could still consider this. His wife was having cataract surgery which is the reason he missed the appointment 12/6. I saw this man 5 days ago with a swelling below the popliteal fossa. I thought he actually might have a Baker's cyst however the DVT rule out study that we could arrange right away was negative the technician told me this was not a ruptured Baker's cyst. We attempted to get this aspirated by under ultrasound guidance in interventional radiology however all they did was an ultrasound however it shows an extensive fluid collection 62 x 8 x 9.4 in the left thigh and left calf. The patient states he thinks this started 8 days ago or so but he really is not complaining of any pain, fever or systemic symptoms. He has not  ha [name], [name] ([id number]) [id number] Page 5 of 21 12/20; after some difficulty I managed to get the patient into see Dr. Randie Heinz. Eventually he was taken into the hospital and had a drain put in the fluid collection below his left knee posteriorly extending into the posterior thigh. He still has the drain in place. Culture of this showed moderate staff aureus few Morganella and few Klebsiella he is now on doxycycline and ciprofloxacin as suggested by infectious disease he is on this for a month. The drain will remain in place until it stops draining 12/29; he comes in today with the 1 wound on his left leg and the area on the left plantar first met head significantly smaller. Both look healthy. He still has the drain in the left leg. He says he has to change this daily. Follows up with Dr. Randie Heinz on January 11. 06/29/2021; the wounds that I am following on the left leg and left first met head continued to be quite healthy. However the area where his inferior drain is in place had copious amounts of drainage which was green in color. The wound here is larger. Follows up with Dr. Pascal Lux of vein and vascular his surgeon next week as well as infectious disease. He remains on ciprofloxacin and doxycycline. He is not complaining of excessive pain in either one of the drain areas 1/12; the patient saw vascular surgery and infectious disease. Vascular surgery has left the drain in place as there was still some notable drainage still see him back in 2 weeks. Dr. Dorthula Perfect stop the doxycycline and ciprofloxacin and I do not believe he follows up with them at this point. Culture I did last week showed both doxycycline resistant MRSA and Pseudomonas not sensitive to ciprofloxacin although only in rare titers 1/19; the patient's wound on the left anterior lower leg is just about healed. We have continued healing of the area that was medially on the left leg. Left first plantar metatarsal head  continues to get smaller. The major problem here is his 2 drain sites 1 on the left upper calf and lateral thigh. There is purulent drainage  still from the left lateral thigh. I gave him antibiotics last week but we still have recultured. He has the drain in the area I think this is eventually going to have to come out. I suspect there will be a connecting wound to heal here perhaps with improved VAc 1/26; the patient had his drain removed by vein and vascular on 1/25/. This was a large pocket of fluid in his left thigh that seem to tunnel into his left upper calf. 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 His mention his Penrose drain was removed today. He now has a tunneling wound on his left calf and left thigh. Both of these probe widely towards each other although I cannot really prove that they connect. Both wounds on his lower leg anteriorly are closed and his area over the first metatarsal head on his right foot continues to improve. We are using Hydrofera Blue here. He also saw infectious disease culture of the abscess they noted was polymicrobial with MRSA, Morganella and Klebsiella he was treated with doxycycline and ciprofloxacin for 4 weeks ending on 07/03/2021. They did not recommend any further antibiotics. Notable that while he still had the Penrose drain in place last week he had purulent drainage coming out of the inferior IandD site this grew [facility], MRSA and Pseudomonas but there does not appear to be any active infection in this area today with the drain out and he is not systemically unwell 2/2; with regards to the drain sites the superior one on the thigh actually is closed down the one on the upper left lateral calf measures about 8 and half centimeters which is an improvement seems to be less prominent although still with a lot of drainage. The only remaining wound is over the first metatarsal head on the left foot and this looks to be continuing to  improve with Hydrofera Blue. 2/9; the area on his plantar left foot continues to contract. Callus around the wound edge. The drain sites specifically have not come down in depth. We put the wound VAC on Monday he changed the canister late last night our intake nurse reported a pocket of fluid perhaps caused by our compression wraps 2/16; continued improvement in left foot plantar wound. drainage site in the calf is not improved in terms of depth (wound vac) 2/23; continued improvement in the left foot wound over the first metatarsal head. With regards to the drain sites the area on his thigh laterally is healed however the open area on his calf is small in terms of circumference by still probes in by about 15 cm. Within using the wound VAC. Hydrofera Blue on his foot 08/24/2021: The left first metatarsal head wound continues to improve. The wound bed is healthy with just some surrounding callus. Unfortunately the open drain site on his calf remains open and tunnels at least 15 cm (the extent of a Q-tip). This is despite several weeks of wound VAC treatment. Based on reading back through the notes, there has been really no significant change in the depth of the wound, although the orifice is smaller and the more cranial wound on his thigh has closed. I suspect the tunnel tracks nearly all the way to this location. 08/31/2021: Continued improvement in the left first metatarsal head wound. There has been absolutely no improvement to the long tunnel from his open drain site on his calf. We have tried to get him into see vascular surgery sooner to consider the  possibility of simply filleting the tract open and allowing it to heal from the bottom up, likely with a wound VAC. They have not yet scheduled a sooner appointment than his current mid April 09/14/2021: He was seen by vascular surgery and they took him to the operating room last week. They opened a portion of the tunnel, but did not extend the entire  length of the known open subcutaneous tract. I read Dr. Darcella Cheshire operative note and it is not clear from that documentation why only a portion of the tract was opened. The heaped up granulation tissue was curetted and removed from at least some portion of the tract. They did place a wound VAC and applied an Unna boot to the leg. The ulcer on his left first metatarsal head is smaller today. The bed looks good and there is just a small amount of surrounding callus. 09/21/2021: The ulcer on his left first metatarsal head looks to be stalled. There is some callus surrounding the wound but the wound bed itself does not appear particularly dynamic. The tunnel tract on his lateral left leg seems to be roughly the same length or perhaps slightly smaller but the wound bed appears healthy with good granulation tissue. He opened up a new wound on his medial thigh and the site of a prior surgical incision. He says that he did this unconsciously in his sleep by scratching. 09/28/2021: Unfortunately, the ulcer on his left first metatarsal head has extended underneath the callus toward the dorsum of his foot. The medial thigh wounds are roughly the same. The tunnel on his lateral left leg continues to be problematic; it is longer than we are able to actually probe with a Q-tip. I am still not certain as to why Dr. Randie Heinz did not open this up entirely when he took the patient to the operating room. We will likely be back in the same situation with just a small superficial opening in a long unhealed tract, as the open portion is granulating in nicely. 10/02/2021: The patient was initially scheduled for a nurse visit, but we are also applying a total contact cast today. The plantar foot wound looks clean without significant accumulated callus. We have been applying Prisma silver collagen to the site. 10/05/2021: The patient is here for his first total contact cast change. We have tried using gauze packing strips in the tunnel on  his lateral leg wound, but this does not seem to be working any better than the white VAC foam. The foot ulcer looks about the same with minimal periwound callus. Medial thigh wound is clean with just some overlying eschar. 10/12/2021: The plantar foot wound is stable without any significant accumulation of periwound callus. The surface is viable with good granulation tissue. The medial thigh wounds are much smaller and are epithelializing. On the other hand, he had purulent drainage coming from the tunnel on his lateral leg. He does go back to see Dr. Randie Heinz next week and is planning to ask him why the wound tunnel was not completely opened at the time of his most recent operation. 10/19/2021: The plantar foot wound is markedly improved and has epithelial tissue coming through the surface. The medial thigh wounds are nearly closed with just a tiny open area. He did see Dr. Randie Heinz earlier this week and apparently they did discuss the possibility of opening the sinus tract further and enabling a wound VAC application. Apparently there are some limits as to what Dr. Randie Heinz feels comfortable opening, presumably  in relationship to his bypass graft. I think if we could get the tract open to the level of the popliteal fossa, this would greatly aid in her ability to get this chart closed. That being said, however, today when I probed the tract with a Q-tip, I was not able to insert the entirety of the Q-tip as I have on previous occasions. The tunnel is shorter by about 4 cm. The surface is clean with good granulation tissue and no further episodes of purulent drainage. 10/30/2021: Last week, the patient underwent surgery and had the long tract in his leg opened. There was a rind that was debrided, according to the operative report. His medial thigh ulcers are closed. The plantar foot wound is clean with a good surface and some built up surrounding callus. [name], [name] ([id number])  [id number] Page 6 of 21 11/06/2021: The overall dimensions of the large wound on his lateral leg remain about the same, but there is good granulation tissue present and the tunneling is a little bit shorter. He has a new wound on his anterior tibial surface, in the same location where he had a similar lesion in the past. The plantar foot wound is clean with some buildup surrounding callus. Just toward the medial aspect of his foot, however, there is an area of darkening that once debrided, revealed another opening in the skin surface. 11/13/2021: The anterior tibial surface wound is closed. The plantar foot wound has some surrounding callus buildup. The area of darkening that I debrided last week and revealed an opening in the skin surface has closed again. The tunnel in the large wound on his lateral leg has come in by about 3 cm. There is healthy granulation tissue on the entire wound surface. 11/23/2021: The patient was out of town last week and did wet-to-dry dressings on his large wound. 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 Unfortunately, he picked open the wound on his left medial thigh. He says that it was itching and he just could not stop scratching it until it was open again. The wound on his plantar foot is smaller and has not accumulated a tremendous amount of callus. The lateral leg wound is shallower and the tunnel has also decreased in depth. There is just a little bit of slough accumulation on the surface. 11/30/2021: Another portion of his left medial thigh has been opened up. All of these wounds are fairly superficial with just a little bit of slough and eschar accumulation. The wound on his plantar foot is almost closed with just a bit of eschar and periwound callus accumulation. The lateral leg wound is nearly flush with the surrounding skin and the tunnel is markedly  shallower. 12/07/2021: There is just 1 open area on his left medial thigh. It is clean with just a little bit of perimeter eschar. The wound on his plantar foot continues to contract and just has some eschar and periwound callus accumulation. The lateral leg wound is closing at the more distal aspect and the tunnel is smaller. The surface is nearly flush with the surrounding skin and it has a good bed of granulation tissue. 12/14/2021: The thigh and foot wounds are closed. The lateral leg wound has closed over approximately half of its length. The tunnel continues to contract and the surface is now flush with the surrounding skin. The wound bed has robust granulation tissue. 12/22/2021: The thigh and foot wounds have  reopened. The foot wound has a lot of callus accumulation around and over it. The thigh wound is tiny with just a little bit of slough in the wound bed. The lateral leg wound continues to contract. His vascular surgeon took the wound VAC off earlier in the week and the patient has been doing wet-to-dry dressings. There is a little slough accumulation on the surface. The tunnel is about 3 cm in depth at this point. 12/28/2021: The thigh wound is closed again. The foot wound has some callus that subsequently has peeled back exposing just a small slit of a wound. The lateral leg wound Is down to about half the size that it originally was and the tunnel is down to about half a centimeter in depth. 01/04/2022: The thigh wound remains closed. The foot wound has heavy callus overlying the wound site. Once this was debrided, the wound was found to be closed. The lateral leg wound is smaller again this week and very superficial. No tunnel could be identified. 01/12/2022: The thigh and foot wounds both remain closed. The lateral leg wound is now nearly flush with the skin surface. There is good granulation tissue present with a light layer of slough. 01/19/2022: Due to the way his wrap was placed, the  patient did not change the dressing on his thigh at all and so the foam was saturated and his skin is macerated. There is a light layer of slough on the wound surface. The underlying granulation tissue is robust and healthy-appearing. He has heavy callus buildup at the site of his first metatarsal head wound which is still healed. 02/01/2022: He has been in silver alginate. When he removed the dressing from his thigh wound, however, some leg, superficially reopening a portion of the wound that had healed. In addition, underneath the callus at his left first metatarsal head, there appears to be a blister and the wound appears to be open again. 02/08/2022: The lateral leg wound has contracted substantially. There is eschar and a light layer of slough present. He says that it is starting to pull and is uncomfortable. On inspection, there is some puckering of the scar and the eschar is quite dry; this may account for his symptoms. On his first metatarsal head, the wound is much smaller with just some eschar on the surface. The callus has not reaccumulated. He reports that he had a blister come up on his medial thigh wound at the distal aspect. It popped and there is now an opening in his skin again. Looking back through his library of wound photos, there is what looks like a permanent suture just deep to this location and it may be trying to erode through. We have been using silver alginate on his wounds. 02/15/2022: The lateral leg wound is about half the size it was last week. It is clean with just a little perimeter eschar and light slough. The wound on his first metatarsal head is about the same with heavy callus overlying it. The medial thigh wound is closed again. He does have some skin changes on the top of his foot that looks potentially yeast related. 02/22/2022: The skin on the top of his foot improved with the use of a topical antifungal. The lateral leg wound continues to contract and is again  smaller this week. There is a little bit of slough and eschar on the surface. The first metatarsal head wound is a little bit smaller but has reaccumulated a thick callus over the top. He  decided to try to trim his toenail and ultimately took the entire nail off of his left great toe. 03/02/2022: His lateral leg wound continues to improve, as does the wound on his left great toe. Unfortunately, it appears that somehow his foot got wet and moisture seeped in through the opening causing his skin to lift. There is a large wound now overlying his first metatarsal on both the plantar, medial, and dorsal portion of his foot. There is necrotic tissue and slough present underneath the shaggy macerated skin. 03/08/2022: The lateral leg wound is smaller again today. There is just a light layer of slough and eschar on the surface. The great toe wound is smaller again today. The first metatarsal wound is a little bit smaller today and does not look nearly as necrotic and macerated. There is still slough and nonviable tissue present. 03/15/2022: The lateral leg wound is narrower and just has a little bit of light slough buildup. The first metatarsal wound still has a fair amount of moisture affecting the periwound skin. The great toe wound is healed. 03/22/2022: The lateral leg wound is now isolated to just at the level of his knee. There is some eschar and slough accumulation. The first metatarsal head wound has epithelialized tremendously and is about half the size that it was last week. He still has some maceration on the top of his foot and a fungal odor is present. 03/29/2022: T oday the patient's foot was macerated, suggesting that the cast got wet. The patient has also been picking at his dry skin and has enlarged the wound on his left lateral leg. In the time between having his cast removed and my evaluation, he had picked more dry skin and opened up additional wounds on his Achilles area and dorsal foot. The  plantar first metatarsal head wound, however, is smaller and clean with just macerated callus around the perimeter and light slough on the surface. The lateral leg wound measured a little bit larger but is also fairly clean with eschar and minimal slough. 04/02/2022: The patient had vascular studies done last Friday and so his cast was not applied. He is here today to have that done. 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 04/05/2022: Both wounds are smaller and quite clean. There is just a little biofilm on the lateral leg wound. 10/20; the patient has a wound on the left lateral surgical incision at the level of his lateral knee this looks clean and improved. He is using silver alginate. He also has an area on his left medial foot for which she is using Hydrofera Blue under a total contact cast both wounds are measuring smaller 04/20/2022: The plantar foot wound has contracted considerably and is very close to closing. The lateral leg wound was measured a little larger, but there was a tiny open area that was included in the measurements that was not included last week. He has some eschar around the perimeter but otherwise the wound looks clean. [name], [name] ([id number]) [id number] Page 7 of 21 04/27/2022: The lateral leg wound looks better this week. He says that midweek, he felt it was very dry and began applying hydrogel to the site. I think this was beneficial. The foot wound is nearly closed underneath a thick layer of dry skin and callus. 05/04/2022: The foot wound is healed. He has developed a new small ulcer on his anterior tibial surface about midway up his leg. It has a little slough on the surface.  The lateral leg wound still is fairly dry, but clean with just a little biofilm on the surface. 05/11/2022: The wound on his foot reopened on Wednesday. A large blister formed which then broke open revealing the fat layer underneath. The ulcer on  his anterior tibial surface is a little bit larger this week. The lateral leg wound has much better moisture balance this week. Fortunately, prior to his foot wound reopening, he did get the cast made for his orthotic. 05/15/2022: Already, the left medial foot wound has improved. The tissue is less macerated and the surface is clean. The ulcer on his anterior tibial surface continues to enlarge. This seems likely secondary to accumulated moisture. The lateral leg wound continues to have an improved moisture balance with the use of collagen. 05/25/2022: The medial foot wound continues to contract. It is now substantially smaller with just a little slough on the surface. The anterior tibial surface wound continues to enlarge further. Once again, this seems to be secondary to moisture. The lateral leg wound does not seem to be changing much in size, but the moisture balance is better. 06/01/2022: The anterior tibial wound is closed. The medial foot wound is down to just a very small, couple of millimeters, opening. The lateral leg wound has good moisture balance, but remains unchanged in size. 12/15; the patient's anterior tibial wound has reopened, however the area on his right first metatarsal head is closed. The major wound is actually on the superior part of his surgical wound in the left lateral thigh. Not a completely viable surface under illumination. This may at some point require a debridement I think he is currently using Prisma. As noted the left medial foot wound has closed 06/14/2022: The anterior tibial wound has closed. The lateral leg wound has a better surface but is basically unchanged in size. The left medial foot wound has reopened. It looks as though there was some callus accumulation and moisture got under the callus which caused the tissue to break down again. 06/21/2022: A new wound has opened up just distal to the previous anterior tibial wound. It is small but has hypertrophic  granulation tissue present. The lateral leg wound is a little bit narrower and has a layer of slough on the surface. The left medial foot wound is down to just a pinhole. His custom orthotics should be available next week. 06/28/2022: The wound on his first metatarsal head has healed. He has developed a new small wound on his medial lower leg, in an old scar site. The lateral leg wound continues to contract but continues to accumulate slough, as well. 07/03/2022: Despite wearing his custom orthopedic shoes, he managed to reopen the wound on his first metatarsal head. He says he thinks his foot got wet and then some skin lifted up and he peeled this away. Both of the lower leg wounds are smaller and have some dry eschar on the surface. The lateral leg wound is quite a bit narrower today. 07/12/2022: The medial lower leg wound is closed. The anterior lower leg wound has contracted considerably. The lateral upper leg wound is narrower with a layer of slough on the surface. The first metatarsal head wound is also smaller, but had copious drainage which saturated the foam border dressing and resulted in some periwound tissue maceration. Fortunately there was no breakdown at this site. 07/19/2022: The lower leg shows signs of significant maceration; I think he must be sweating excessively inside his cast. There are several areas of  skin breakdown present. The wound on his foot is smaller and that on his lateral leg is narrower and is shorter by about a centimeter. 07/26/2022: Last week we used a zinc Coflex wrap prior to applying his total contact cast and this has had the effect of keeping his skin from getting macerated this week. The anterior leg wound has epithelialized substantially. The lateral leg wound is significantly smaller with just a bit of slough on the surface. The first metatarsal head wound is also smaller this week. 08/02/2022: The anterior leg wound was closed on arrival, but while he was sitting  in the room, he picked it open again. The lateral leg wound is smaller with just a little slough on the surface and the first metatarsal head wound has contracted further, as well. 08/09/2022: The first metatarsal head wound is covered with callus. Underneath the callus, it is nearly completely closed. The lateral leg wound is smaller again this week. The anterior leg wound looks better, but he has such heavy buildup of old skin, that moisture is getting underneath this, becoming trapped, and causing the underlying skin to get macerated and open up. 08/16/2022: The first metatarsal head wound is closed. The lateral leg wound continues to contract and is quite a bit smaller again this week. There is just a small, superficial opening remaining on his anterior tibial surface. 08/23/2022: The first metatarsal head wound has, by some miracle, remained closed. The lateral leg wound is substantially smaller with multiple areas of epithelialization. The anterior tibial surface wound is also quite a bit smaller and very clean. 08/30/2022: Unfortunately, his first metatarsal head wound opened up again. It happened in the same fashion as it has on prior occasions. Moisture got under dried skin/callus and created a wound when he removed his sock, taking the skin with it. The anterior tibial surface has a thick shell of hyperkeratotic skin. This has been contributing to ongoing repeat wounding events as moisture gets underneath this and causes tissue breakdown. 3/15; patient presents for follow-up. His anterior left leg wound has healed. He still has the wound to the left lateral aspect and left first met head. We have been using silver alginate and endoform to these areas under [employer]. He has no issues or complaints today. He has been taking Augmentin and reports improvement to his symptoms to the left first met head. 09/13/2022: He has accumulated more thick dry skin in sheets on his lower leg. The lateral leg  wound is about the same size and the left first metatarsal head wound is a little bit smaller. There is slough on both surfaces. There is callus buildup around the foot wound. 09/20/2022: The lateral leg wound is a little bit narrower and the left first metatarsal head wound also seems to have contracted slightly. There is slough on both surfaces. He has a little skin breakdown on his anterior tibial surface. 09/27/2022: The lateral leg wound continues to contract and is quite clean. The first metatarsal head wound is also smaller. There is some perimeter callus and slough accumulation on the foot. The anterior tibial surface is closed. 10/04/2022: Both of his wounds are smaller today, particularly the first metatarsal head wound. 10/18/2022: He missed his appointment last week and ended up cutting off his wrap on Saturday. The anterior tibial wound reopened. It is fairly superficial with a little bit of slough on the surface. His lateral leg wound is smaller with some slough and eschar buildup. The first metatarsal head wound  is also smaller with some callus and slough accumulation. 10/25/2022: All wounds are smaller. There is slough and eschar on the lateral leg and slough and callus on the plantar foot wound. The anterior tibial wound is clean and flush with the surrounding skin. No debris accumulation here. 11/01/2022: The wounds are all smaller again this week. There is slough on the lateral leg and some minimal slough and eschar on the anterior tibial wound. There is callus accumulation on the first metatarsal head site, along with slough. There is also a yeasty odor coming from the foot. [name], [name] ([id number]) [id number] Page 8 of 21 11/08/2022: The lateral leg wound is smaller except where he picked some skin while waiting to be seen. There is a little bit of slough on the surface. The anterior tibial wound is closed. The first metatarsal head site has gotten macerated  once again and has a lot of spongy wet tissue and callus around it. No yeast odor today. 11/15/2022: The lateral leg wound continues to contract. There is now a band of epithelium dividing it into 2 areas. Minimal slough accumulation. He managed to pick open a new wound on his medial lower leg. The first metatarsal head site is smaller with some callus accumulation but no tissue maceration. 11/22/2022: The lateral leg wound is smaller again by about a third today. There is a little bit of slough on the surface with some periwound eschar. The new wound that he picked open last week has healed but he picked open 3 new small wounds on his anterior tibial surface, once again due to his picking at his skin. The first metatarsal head wound looks about the same. There has been some callus accumulation and there is more edema present, as he was not put in a compression wrap last week. 11/29/2022: The lateral leg wound is smaller again today. There is a little periwound eschar and some slough present. The wounds on his anterior tibial surface are all closed except for 1 that has a little bit of slough on the open portion with eschar covering it. The first metatarsal head wound measured a little bit smaller today, but mostly looks about the same. Edema control is better. 12/06/2022: The anterior tibial wound is closed. The lateral leg wound is smaller with some slough and eschar accumulation. The plantar foot ulcer is about the same size, but the tissue does show some evidence of the fact that he was on his feet quite a bit more this past week; there was a death in his family. There has been more callus accumulation and the tissues are little bit more purpleish. Electronic Signature(s) Signed: 12/06/2022 10:49:08 AM By: [name] Guess MD FACS Entered By: [name] on 12/06/2022 10:49:08 -------------------------------------------------------------------------------- Physical Exam Details Patient Name:  Date of Service: [name]. 12/06/2022 10:00 A M Medical Record Number: [id number] Patient Account Number: [id number] Date of Birth/Sex: Treating RN: 29-Jan-1951 (72 y.o. M) Primary Care Provider: [name] Other Clinician: Referring Provider: Treating Provider/Extender: [name] in Treatment: 69 Constitutional . . . . no acute distress. Respiratory Normal work of breathing on room air. Notes 12/06/2022: The anterior tibial wound is closed. The lateral leg wound is smaller with some slough and eschar accumulation. The plantar foot ulcer is about the same size, but the tissue does show some evidence of the fact that he was on his feet quite a bit more this past week.. There has been more callus accumulation and the tissues  are little bit more purpleish. Electronic Signature(s) Signed: 12/06/2022 10:49:56 AM By: [name] Guess MD FACS Entered By: [name] on 12/06/2022 10:49:56 -------------------------------------------------------------------------------- Physician Orders Details Patient Name: Date of Service: [name]. 12/06/2022 10:00 A M Medical Record Number: [id number] Patient Account Number: [id number] Date of Birth/Sex: Treating RN: 1951/05/05 (72 y.o. [name] Primary Care Provider: [name] Other Clinician: Referring Provider: Treating Provider/Extender: [name] in Treatment: 39 Verbal / Phone Orders: No Diagnosis [address] CHERIF, VEREB ([id number]) [id number] Page 9 of 21 ICD-10 Coding Code Description [phone] 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.828 Non-pressure chronic ulcer of other part of left lower leg with other specified severity I87.322 Chronic venous hypertension (idiopathic) with inflammation of left lower extremity E11.51 Type 2 diabetes mellitus with diabetic peripheral angiopathy without  gangrene I89.0 Lymphedema, not elsewhere classified L85.9 Epidermal thickening, unspecified Follow-up Appointments ppointment in 1 week. - Dr [name] - Room 2 Return A Anesthetic (In clinic) Topical Lidocaine 4% applied to wound bed Bathing/ Shower/ Hygiene May shower with protection but do not get wound dressing(s) wet. Protect dressing(s) with water repellant cover (for example, large plastic bag) or a cast cover and may then take shower. Edema Control - Lymphedema / SCD / Other Avoid standing for long periods of time. Patient to wear own compression stockings every day. - both legs daily Moisturize legs daily. Compression stocking or Garment 20-30 mm/Hg pressure to: Off-Loading Wound #18R Left,Plantar Metatarsal head first Open toe surgical shoe to: - Front off loader Left ft Other: - minimal weight bearing left foot Wound Treatment Wound #18R - Metatarsal head first Wound Laterality: Plantar, Left Cleanser: Soap and Water 1 x Per Week/30 Days Discharge Instructions: May shower and wash wound with dial antibacterial soap and water prior to dressing change. Cleanser: Wound Cleanser 1 x Per Week/30 Days Discharge Instructions: Cleanse the wound with wound cleanser prior to applying a clean dressing using gauze sponges, not tissue or cotton balls. Peri-Wound Care: Ketoconazole Cream 2% 1 x Per Week/30 Days Discharge Instructions: Apply Ketoconazole as directed Prim Dressing: Maxorb Extra Ag+ Alginate Dressing, 2x2 (in/in) 1 x Per Week/30 Days ary Discharge Instructions: Apply to wound bed as instructed Secondary Dressing: Optifoam Non-Adhesive Dressing, 4x4 in (Generic) 1 x Per Week/30 Days Discharge Instructions: Apply over primary dressing as directed. Secondary Dressing: Woven Gauze Sponges 2x2 in (Generic) 1 x Per Week/30 Days Discharge Instructions: Apply over primary dressing as directed. Secondary Dressing: Zetuvit Absorbent Pad, 4x4 (in/in) 1 x Per Week/30 Days Compression  Wrap: Urgo K2, (equivalent to a 4 layer) two layer compression system, regular 1 x Per Week/30 Days Discharge Instructions: Apply Urgo K2 as directed (alternative to 4 layer compression). Wound #22 - Lower Leg Wound Laterality: Left, Lateral, Proximal Cleanser: Soap and Water 3 x Per Week/30 Days Discharge Instructions: May shower and wash wound with dial antibacterial soap and water prior to dressing change. Cleanser: Wound Cleanser 3 x Per Week/30 Days Discharge Instructions: Cleanse the wound with wound cleanser prior to applying a clean dressing using gauze sponges, not tissue or cotton balls. Peri-Wound Care: Skin Prep 3 x Per Week/30 Days Discharge Instructions: Use skin prep as directed Peri-Wound Care: Sween Lotion (Moisturizing lotion) 3 x Per Week/30 Days Discharge Instructions: Apply moisturizing lotion to the leg Topical: Skintegrity Hydrogel 4 (oz) 3 x Per Week/30 Days Discharge Instructions: Apply hydrogel as directed Prim Dressing: Endoform 2x2 in 3 x Per Week/30 Days  ary Discharge Instructions: Moisten with Hydrogel or saline Secondary Dressing: Zetuvit Plus 4x8 in 3 x Per Week/30 Days MARTY, MYERSON ([id number]) [id number] Page 10 of 21 Discharge Instructions: Apply over primary dressing as directed. Secured With: Elastic Bandage 4 inch (ACE bandage) 3 x Per Week/30 Days Discharge Instructions: Secure with ACE bandage as directed. Patient Medications llergies: No Known Drug Allergies A Notifications Medication Indication Start End 12/06/2022 lidocaine DOSE topical 4 % cream - cream topical Electronic Signature(s) Signed: 12/06/2022 12:40:38 PM By: [name] Guess MD FACS Entered By: [name] on 12/06/2022 10:50:10 -------------------------------------------------------------------------------- Problem List Details Patient Name: Date of Service: [name]. 12/06/2022 10:00 A M Medical Record Number: [id number] Patient Account  Number: [id number] Date of Birth/Sex: Treating RN: 1950/11/09 (72 y.o. M) Primary Care Provider: [name] Other Clinician: Referring Provider: Treating Provider/Extender: [name] in Treatment: 61 Active Problems ICD-10 Encounter Code Description Active Date MDM Diagnosis L97.528 Non-pressure chronic ulcer of other part of left foot with other specified 08/26/2022 No Yes severity L97.828 Non-pressure chronic ulcer of other part of left lower leg with other specified 04/12/2021 No Yes severity I87.322 Chronic venous hypertension (idiopathic) with inflammation of left lower 04/12/2021 No Yes extremity E11.51 Type 2 diabetes mellitus with diabetic peripheral angiopathy without gangrene 04/12/2021 No Yes I89.0 Lymphedema, not elsewhere classified 04/12/2021 No Yes L85.9 Epidermal thickening, unspecified 08/30/2022 No Yes Inactive Problems ICD-10 Code Description Active Date Inactive Date E11.621 Type 2 diabetes mellitus with foot ulcer 04/12/2021 04/12/2021 [name] ([id number]) [id number] Page 11 of 21 [phone] Non-pressure chronic ulcer of other part of left lower leg with other specified severity 06/08/2022 06/08/2022 E11.42 Type 2 diabetes mellitus with diabetic polyneuropathy 04/12/2021 04/12/2021 L02.416 Cutaneous abscess of left lower limb 06/13/2021 06/13/2021 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 12/06/2022 10:00 A M Medical Record Number: [id number] Patient Account Number: [id number] Date of Birth/Sex: Treating RN: 12/29/1950 (72 y.o. M) Primary Care  Provider: [name] Other Clinician: Referring Provider: Treating Provider/Extender: [name] in Treatment: 29 Subjective Chief Complaint Information obtained from Patient Left leg and foot ulcers 04/12/2021; patient is here for wounds on his left lower leg and left plantar foot over the first metatarsal head History of Present Illness (HPI) 10/11/17; Mr. Orn is a 72 year old man who tells me that in 2015 he slipped down the latter traumatizing his left leg. He developed a wound in the same spot the area that we are currently looking at. He states this closed over for the most part although he always felt it was somewhat unstable. In 2016 he hit the same area with the door of his car had this reopened. He tells me that this is never really closed although sometimes an inflow it remains open on a constant basis. He has not been using any specific dressing to this except for topical antibiotics the nature of which were not really sure. His primary doctor did send him to see Dr. Jacinto Halim of interventional cardiology. He underwent an angiogram on 08/06/17 and he underwent a PTA and directional atherectomy of the lesser distal SFA and popliteal arteries which resulted in brisk improvement in blood flow. It was noted that he had 2 vessel runoff through the anterior tibial and peroneal. He  is also been to see vascular and interventional radiologist. He was not felt to have any significant superficial venous insufficiency. Presumably is not a candidate for any ablation. It was suggested he come here for wound care. The patient is a type II diabetic on insulin. He also has a history of venous insufficiency. ABIs on the left were noncompressible in our clinic 10/21/17; patient we admitted to the clinic last week. 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 We put Iodosorb on him after an aggressive debridement and 3  layer compression. He complained of pain in his ankle and itching with is skin in fact he scratched the area on the medial calf superiorly at the rim of our wraps and he has 2 small open areas in that location today which are new. I changed his primary dressing today to silver collagen. As noted he is already had revascularization and does not have any significant superficial venous insufficiency that would be amenable to ablation 10/28/17; patient admitted to the clinic 2 weeks ago. He has a smaller Wound. Scratch injury from last week revealed. There is large wound over the tibial area. This is smaller. Granulation looks healthy. No need for debridement. 11/04/17; the wound on the left lateral calf looks better. Improved dimensions. Surface of this looks better. We've been maintaining him and Kerlix Coban wraps. He finds this much more comfortable. Silver collagen dressing 11/11/17; left lateral Wound continues to look healthy be making progress. Using a #5 curet I removed removed nonviable skin from the surface of the wound and then necrotic debris from the wound surface. Surface of the wound continues to look healthy. He also has an open area on the left great toenail bed. We've been using topical antibiotics. 11/19/17; left anterior lateral wound continues to look healthy but it's not closed. He also had a small wound above this on the left leg Initially traumatic wounds in the setting of significant chronic venous insufficiency and stasis dermatitis 11/25/17; left anterior wounds superiorly is closed still a small wound inferiorly. 12/02/17; left anterior tibial area. Arrives today with adherent callus. Post debridement clearly not completely closed. Hydrofera Blue under 3 layer compression. 12/09/17; left anterior tibia. Circumferential eschar however the wound bed looks stable to improved. We've been using Hydrofera Blue under 3 layer compression 12/17/17; left anterior tibia. Apparently this was  felt to be closed however when the wrap was taken off there is a skin tear to reopen wounds in the same area we've been using Hydrofera Blue under 3 layer compression [name], [name] ([id number]) [id number] Page 12 of 21 12/23/17 left anterior tibia. Not close to close this week apparently the [facility] was stuck to this again. Still circumferential eschar requiring debridement. I put a contact layer on this this time under the Hydrofera Blue 12/31/17; left anterior tibia. Wound is better slight amount of hyper-granulation. Using Hydrofera Blue over Adaptic. 01/07/18; left anterior tibia. The wound had some surface eschar however after this was removed he has no open wound.he was already revascularized by Dr. Jacinto Halim when he came to our clinic with atherectomy of the left SFA and popliteal artery. He was also sent to interventional radiology for venous reflux studies. He was not felt to have significant reflux but certainly has chronic venous changes of his skin with hemosiderin deposition around this area. He will definitely need to lubricate his skin and wear compression stocking and I've talked to him  about this. READMISSION 05/26/2018 This is a now 72 year old man we cared for with traumatic wounds on his left anterior lower extremity. He had been previously revascularized during that admission by Dr. Jacinto Halim. Apparently in follow-up Dr. Jacinto Halim noted that he had deterioration in his arterial status. He underwent a stent placement in the distal left SFA on 04/22/2018. 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 He went back to the angiography suite on 04/30/2018 he underwent PTA and balloon angioplasty of the occluded left mid anterior tibial artery, thrombotic occlusion went from 100 to 0% which reconstitutes the posterior tibial artery. He had thrombectomy and aspiration of the peroneal artery. The stent placed in the distal SFA left SFA was still occluded. He  was discharged on Xarelto, it was noted on the discharge summary from this hospitalization that he had gangrene at the tip of his left fifth toe and there were expectations this would auto amputate. 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 He has been recuperating at [facility] home in [facility] after the most recent hospitalization. He is going home tomorrow. He tells me that 2 weeks ago he traumatized the tip of his left fifth toe. He came in urgently for our review of this. This was a history of before I noted that Dr. Jacinto Halim had already noted dry gangrenous changes of the left fifth toe 06/09/2018; 2-week follow-up. I did contact Dr. Jacinto Halim after his last appointment and he apparently saw 1 of Dr. Verl Dicker colleagues the next day. He does not follow-up with Dr. Jacinto Halim himself until Thursday of this week. 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 Nevertheless there is no evidence of infection no drainage and no pain. He had a new area that this week when we were signing him in today on the left anterior mid tibia area, this is in close proximity to the previous wound we have dealt with in this clinic. 06/23/2018; 2-week follow-up. I did not receive a recent note from Dr. Jacinto Halim to review today. Our office is trying to obtain this. He is apparently not planning to do further vascular interventions and wondered about compression to try and help with the patient's chronic venous insufficiency. However we are also concerned about the arterial flow. He arrives in clinic today with a new area on the left third toe. The areas on the calf/anterior tibia are close to closing. The left fifth toe is still mummified using Betadine. -In reviewing things with the patient he has what sounds like claudication with mild to moderate amount of activity. 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 I prescribed  Levaquin over the phone while we attempted to arrange a plan of care. However the patient called yesterday to report he had low-grade fever and he came in today acutely. There is been a marked deterioration in the left third toe with spreading cellulitis up into the dorsal left foot. He was referred to the emergency room. Readmission: 06/29/2020 patient presents today for reevaluation here in our clinic he was previously treated by Dr. Leanord Hawking at the latter part of 2019 in 2 the beginning of 2020. Subsequently we have not seen him since that time in the interim he did have evaluation with vein and vascular specialist specifically Dr. Bo Mcclintock who did perform quite extensive work for a left femoral to anterior tibial artery bypass. With that being said in  the interim the patient has developed significant lymphedema and has wounds that he tells me have really never healed in regard to the incision site on the left leg. He also has multiple wounds on the feet for various reasons some of which is that he tends to pick at his feet. Fortunately there is no signs of active infection systemically at this time he does have some wounds that are little bit deeper but most are fairly superficial he seems to have good blood flow and overall everything appears to be healthy I see no bone exposed and no obvious signs of osteomyelitis. I do not know that he necessarily needs a x-ray at this point although that something we could consider depending on how things progress. The patient does have a history of lymphedema, diabetes, this is type II, chronic kidney disease stage III, hypertension, and history of peripheral vascular disease. 07/05/2020; patient admitted last week. Is a patient I remember from 2019 he had a spreading infection involving the left foot and we sent him to the hospital. 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 He subsequently had a left femoral to anterior  tibial bypass by Dr.Cain vein and vascular. He also has severe lymphedema with chronic skin changes related to that on the left leg. The most problematic area that was new today was on the left medial great toe. This was apparently a small area last week there was purulent drainage which our intake nurse cultured. Also areas on the left medial foot and heel left lateral foot. He has 2 areas on the left medial calf left lateral calf in the setting of the severe lymphedema. 07/13/2020 on evaluation today patient appears to be doing better in my opinion compared to his last visit. The good news is there is no signs of active infection systemically and locally I do not see any signs of infection either. He did have an x-ray which was negative that is great news he had a culture which showed MRSA but at the same time he is been on the doxycycline which has helped. I do think we may want to extend this for 7 additional days 1/25; patient admitted to the clinic a few weeks ago. 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 We have been putting him under compression his edema control is a lot better but he is severe verricused skin on the left leg. He is really done quite well he still has an open area on the left medial calf and the left medial first metatarsal head. We have been using silver collagen on the leg silver alginate on the foot 07/27/2020 upon evaluation today patient appears to be doing decently well in regard to his wounds. He still has a lot of dry skin on the left leg. Some of this is starting to peel back and I think he may be able to have them out by removing some that today. Fortunately there is no signs of active infection at this time on the left leg although on the right leg he does appear to have swelling and erythema as well as some mild warmth to touch. This does have been concerned about the possibility of cellulitis although within the  differential diagnosis I do think that potentially a DVT has to be at least considered. We need to rule that out before proceeding would just call in the cellulitis. Especially since he is  having pain in the posterior aspect of his calf muscle. 2/8; the patient had seen sparingly. He has severe skin changes of chronic lymphedema in the left leg thickened hyperkeratotic verrucous skin. He has an open wound on the medial part of the left first met head left mid tibia. He also has a rim of nonepithelialized skin in the anterior mid tibia. He brought in the AmLactin lotion that was been prescribed although I am not sure under compression and its utility. There concern about cellulitis on the right lower leg the last time he was here. He was put on on antibiotics. His DVT rule out was negative. The right leg looks fine he is using his stocking on this area 08/10/2020 upon evaluation today patient appears to be doing well with regard to his leg currently. He has been tolerating the dressing changes without complication. Fortunately there is no signs of active infection which is great news. Overall very pleased with where things stand. 2/22; the patient still has an area on the medial part of the left first met his head. This looks better than when I last saw this earlier this month he has a rim of epithelialization but still some surface debris. Mostly everything on the left leg is healed. There is still a vulnerable in the left mid tibia area. 08/30/2020 upon evaluation today patient appears to be doing much better in regard to his wounds on his foot. 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 Nonetheless I think we will address that today I Minna send in a prescription for him in that regard. Overall though there does not appear to be any signs of significant  worsening. 09/07/2020 on evaluation today patient's wounds over his left foot appear to be doing excellent. I do not see any signs of infection there is some callus buildup this can require debridement for certain but overall I feel like he is managing quite nicely. He still using the AmLactin cream which has been beneficial for him as well. 3/22; left foot wound is closed. There is no open area here. He is using ammonium lactate lotion to the lower extremities to help exfoliate dry cracked skin. He has compression stockings from [facility] in [place]. The wound on the medial part of his left first met head is healed today. READMISSION 04/12/2021 [name] ([id number]) [id number] Page 13 of 21 Mr. Hulce is a patient we know fairly well he had a prolonged stay in clinic in 2019 with wounds on his left lateral and left anterior lower extremity in the setting of chronic venous insufficiency. More recently he was here earlier this year with predominantly an area on his left foot first metatarsal head plantar and he says the plantar foot broke down on its not long after we discharged him but he did not come back here. The last few months areas of broken down on his left anterior and again the left lateral lower extremity. The leg itself is very swollen chronically enlarged a lot of hyperkeratotic dry Berry Q skin in the left lower leg. His edema extends well into the thigh. He was seen by Dr. Randie Heinz. He had ABIs on 03/02/2021 showing an ABI on the right of 1 with a TBI of 0.72 his ABI in the left at 1.09 TBI of 0.99. Monophasic and biphasic waveforms on the right. On the  left monophasic waveforms were noted he went on to have an angiogram on 03/27/2021 this showed the aortic aortic and iliac segments were free of flow-limiting stenosis the left common femoral vein to evaluate the left femoral to anterior tibial artery bypass was unobstructed the bypass was patent without  any areas of stenosis. We discharged the patient in bilateral juxta lite stockings but very clearly that was not sufficient to control the swelling and maintain skin integrity. He is clearly going to need compression pumps. 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 This is fortunate because he is on his feet for long periods of time. 10/27; patient comes in with our intake nurse reporting copious amount of green drainage from the left anterior mid tibia the left dorsal foot and to a lesser extent the left medial mid tibia. We left the compression wrap on all week for the amount of edema in his left leg is quite a bit better. We use silver alginate as the primary dressing 11/3; edema control is good. Left anterior lower leg left medial lower leg and the plantar first metatarsal head. The left anterior lower leg required debridement. Deep tissue culture I did of this wound showed MRSA I put him on 10 days of doxycycline which she will start today. We have him in compression wraps. He has a security card and [employer] however he is retiring on November 15. We will need to then get him into a better offloading boot for the left foot perhaps a total contact cast 11/10; edema control is quite good. Left anterior and left medial lower leg wounds in the setting of chronic venous insufficiency and lymphedema. He also has a substantial area over the left plantar first metatarsal head. 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 He has significant hypergranulation on the left plantar foot wound. The patient is a diabetic but he does not have significant PAD 11/17; edema control is quite good. Left anterior and left medial lower leg wounds look better. The really concerning area remains the area on the left plantar first metatarsal head. He has a rim of epithelialization.  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 Starting today with a forefoot off loader but . possibly a total contact cast. 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 Arrives with a very significant odor from I think all of the wounds on the left leg and the left foot. Because of this I did not put a total contact cast on him today but will could still consider this. His wife was having cataract surgery which is the reason he missed the appointment 12/6. I saw this man 5 days ago with a swelling below the popliteal fossa. I thought he actually might have a Baker's cyst however the DVT rule out study that we could arrange right away was negative the technician told me this was not a ruptured Baker's cyst. We attempted to get this aspirated by under ultrasound guidance in interventional radiology however all they did was an ultrasound however it shows an extensive fluid collection  62 x 8 x 9.4 in the left thigh and left calf. The patient states he thinks this started 8 days ago or so but he really is not complaining of any pain, fever or systemic symptoms. He has not ha 12/20; after some difficulty I managed to get the patient into see Dr. Randie Heinz. Eventually he was taken into the hospital and had a drain put in the fluid collection below his left knee posteriorly extending into the posterior thigh. He still has the drain in place. Culture of this showed moderate staff aureus few Morganella and few Klebsiella he is now on doxycycline and ciprofloxacin as suggested by infectious disease he is on this for a month. The drain will remain in place until it stops draining 12/29; he comes in today with the 1 wound on his left leg and the area on the left plantar first met head significantly  smaller. Both look healthy. He still has the drain in the left leg. He says he has to change this daily. Follows up with Dr. Randie Heinz on January 11. 06/29/2021; the wounds that I am following on the left leg and left first met head continued to be quite healthy. However the area where his inferior drain is in place had copious amounts of drainage which was green in color. The wound here is larger. Follows up with Dr. Pascal Lux of vein and vascular his surgeon next week as well as infectious disease. He remains on ciprofloxacin and doxycycline. He is not complaining of excessive pain in either one of the drain areas 1/12; the patient saw vascular surgery and infectious disease. Vascular surgery has left the drain in place as there was still some notable drainage still see him back in 2 weeks. Dr. Dorthula Perfect stop the doxycycline and ciprofloxacin and I do not believe he follows up with them at this point. Culture I did last week showed both doxycycline resistant MRSA and Pseudomonas not sensitive to ciprofloxacin although only in rare titers 1/19; the patient's wound on the left anterior lower leg is just about healed. We have continued healing of the area that was medially on the left leg. Left first plantar metatarsal head continues to get smaller. The major problem here is his 2 drain sites 1 on the left upper calf and lateral thigh. There is purulent drainage still from the left lateral thigh. I gave him antibiotics last week but we still have recultured. He has the drain in the area I think this is eventually going to have to come out. I suspect there will be a connecting wound to heal here perhaps with improved VAc 1/26; the patient had his drain removed by vein and vascular on 1/25/. This was a large pocket of fluid in his left thigh that seem to tunnel into his left upper calf. 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 His mention his Penrose drain was removed today. He now has a tunneling  wound on his left calf and left thigh. Both of these probe widely towards each other although I cannot really prove that they connect. Both wounds on his lower leg anteriorly are closed and his area over the first metatarsal head on his right foot continues to improve. We are using Hydrofera Blue here. He also saw infectious disease culture of the abscess they noted was polymicrobial with MRSA, Morganella and Klebsiella he was treated with doxycycline and ciprofloxacin for 4 weeks ending on 07/03/2021. They did not recommend any further antibiotics. Notable that  while he still had the Penrose drain in place last week he had purulent drainage coming out of the inferior IandD site this grew [facility], MRSA and Pseudomonas but there does not appear to be any active infection in this area today with the drain out and he is not systemically unwell 2/2; with regards to the drain sites the superior one on the thigh actually is closed down the one on the upper left lateral calf measures about 8 and half centimeters which is an improvement seems to be less prominent although still with a lot of drainage. The only remaining wound is over the first metatarsal head on the left foot and this looks to be continuing to improve with Hydrofera Blue. 2/9; the area on his plantar left foot continues to contract. Callus around the wound edge. The drain sites specifically have not come down in depth. We put the wound VAC on Monday he changed the canister late last night our intake nurse reported a pocket of fluid perhaps caused by our compression wraps 2/16; continued improvement in left foot plantar wound. drainage site in the calf is not improved in terms of depth (wound vac) 2/23; continued improvement in the left foot wound over the first metatarsal head. With regards to the drain sites the area on his thigh laterally is healed however the open area on his calf is small in terms of circumference by still probes in  by about 15 cm. Within using the wound VAC. Hydrofera Blue on his foot 08/24/2021: The left first metatarsal head wound continues to improve. The wound bed is healthy with just some surrounding callus. Unfortunately the open drain site on his calf remains open and tunnels at least 15 cm (the extent of a Q-tip). This is despite several weeks of wound VAC treatment. Based on reading back KAYCEN, DUFFY ([id number]) [id number] Page 14 of 21 through the notes, there has been really no significant change in the depth of the wound, although the orifice is smaller and the more cranial wound on his thigh has closed. I suspect the tunnel tracks nearly all the way to this location. 08/31/2021: Continued improvement in the left first metatarsal head wound. There has been absolutely no improvement to the long tunnel from his open drain site on his calf. We have tried to get him into see vascular surgery sooner to consider the possibility of simply filleting the tract open and allowing it to heal from the bottom up, likely with a wound VAC. They have not yet scheduled a sooner appointment than his current mid April 09/14/2021: He was seen by vascular surgery and they took him to the operating room last week. They opened a portion of the tunnel, but did not extend the entire length of the known open subcutaneous tract. I read Dr. Darcella Cheshire operative note and it is not clear from that documentation why only a portion of the tract was opened. The heaped up granulation tissue was curetted and removed from at least some portion of the tract. They did place a wound VAC and applied an Unna boot to the leg. The ulcer on his left first metatarsal head is smaller today. The bed looks good and there is just a small amount of surrounding callus. 09/21/2021: The ulcer on his left first metatarsal head looks to be stalled. There is some callus surrounding the wound but the wound bed itself does not  appear particularly dynamic. The tunnel tract on his lateral left leg seems to be  roughly the same length or perhaps slightly smaller but the wound bed appears healthy with good granulation tissue. He opened up a new wound on his medial thigh and the site of a prior surgical incision. He says that he did this unconsciously in his sleep by scratching. 09/28/2021: Unfortunately, the ulcer on his left first metatarsal head has extended underneath the callus toward the dorsum of his foot. The medial thigh wounds are roughly the same. The tunnel on his lateral left leg continues to be problematic; it is longer than we are able to actually probe with a Q-tip. I am still not certain as to why Dr. Randie Heinz did not open this up entirely when he took the patient to the operating room. We will likely be back in the same situation with just a small superficial opening in a long unhealed tract, as the open portion is granulating in nicely. 10/02/2021: The patient was initially scheduled for a nurse visit, but we are also applying a total contact cast today. The plantar foot wound looks clean without significant accumulated callus. We have been applying Prisma silver collagen to the site. 10/05/2021: The patient is here for his first total contact cast change. We have tried using gauze packing strips in the tunnel on his lateral leg wound, but this does not seem to be working any better than the white VAC foam. The foot ulcer looks about the same with minimal periwound callus. Medial thigh wound is clean with just some overlying eschar. 10/12/2021: The plantar foot wound is stable without any significant accumulation of periwound callus. The surface is viable with good granulation tissue. The medial thigh wounds are much smaller and are epithelializing. On the other hand, he had purulent drainage coming from the tunnel on his lateral leg. He does go back to see Dr. Randie Heinz next week and is planning to ask him why the wound  tunnel was not completely opened at the time of his most recent operation. 10/19/2021: The plantar foot wound is markedly improved and has epithelial tissue coming through the surface. The medial thigh wounds are nearly closed with just a tiny open area. He did see Dr. Randie Heinz earlier this week and apparently they did discuss the possibility of opening the sinus tract further and enabling a wound VAC application. Apparently there are some limits as to what Dr. 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 I think if we could get the tract open to the level of the popliteal fossa, this would greatly aid in her ability to get this chart closed. That being said, however, today when I probed the tract with a Q-tip, I was not able to insert the entirety of the Q-tip as I have on previous occasions. The tunnel is shorter by about 4 cm. The surface is clean with good granulation tissue and no further episodes of purulent drainage. 10/30/2021: Last week, the patient underwent surgery and had the long tract in his leg opened. There was a rind that was debrided, according to the operative report. His medial thigh ulcers are closed. The plantar foot wound is clean with a good surface and some built up surrounding callus. 11/06/2021: The overall dimensions of the large wound on his lateral leg remain about the same, but there is good granulation tissue present and the tunneling is a little bit shorter. He has a new wound on his anterior tibial surface, in the same location where he had a similar lesion in the past. The  plantar foot wound is clean with some buildup surrounding callus. Just toward the medial aspect of his foot, however, there is an area of darkening that once debrided, revealed another opening in the skin surface. 11/13/2021: The anterior tibial surface wound is closed. The plantar foot wound has some surrounding callus buildup. The area of darkening that I debrided  last week and revealed an opening in the skin surface has closed again. The tunnel in the large wound on his lateral leg has come in by about 3 cm. There is healthy granulation tissue on the entire wound surface. 11/23/2021: The patient was out of town last week and did wet-to-dry dressings on his large wound. 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 Unfortunately, he picked open the wound on his left medial thigh. He says that it was itching and he just could not stop scratching it until it was open again. The wound on his plantar foot is smaller and has not accumulated a tremendous amount of callus. The lateral leg wound is shallower and the tunnel has also decreased in depth. There is just a little bit of slough accumulation on the surface. 11/30/2021: Another portion of his left medial thigh has been opened up. All of these wounds are fairly superficial with just a little bit of slough and eschar accumulation. The wound on his plantar foot is almost closed with just a bit of eschar and periwound callus accumulation. The lateral leg wound is nearly flush with the surrounding skin and the tunnel is markedly shallower. 12/07/2021: There is just 1 open area on his left medial thigh. It is clean with just a little bit of perimeter eschar. The wound on his plantar foot continues to contract and just has some eschar and periwound callus accumulation. The lateral leg wound is closing at the more distal aspect and the tunnel is smaller. The surface is nearly flush with the surrounding skin and it has a good bed of granulation tissue. 12/14/2021: The thigh and foot wounds are closed. The lateral leg wound has closed over approximately half of its length. The tunnel continues to contract and the surface is now flush with the surrounding skin. The wound bed has robust granulation tissue. 12/22/2021: The thigh and foot wounds have reopened. The foot  wound has a lot of callus accumulation around and over it. The thigh wound is tiny with just a little bit of slough in the wound bed. The lateral leg wound continues to contract. His vascular surgeon took the wound VAC off earlier in the week and the patient has been doing wet-to-dry dressings. There is a little slough accumulation on the surface. The tunnel is about 3 cm in depth at this point. 12/28/2021: The thigh wound is closed again. The foot wound has some callus that subsequently has peeled back exposing just a small slit of a wound. The lateral leg wound Is down to about half the size that it originally was and the tunnel is down to about half a centimeter in depth. 01/04/2022: The thigh wound remains closed. The foot wound has heavy callus overlying the wound site. Once this was debrided, the wound was found to be closed. The lateral leg wound is smaller again this week and very superficial. No tunnel could be identified. 01/12/2022: The thigh and foot wounds both remain closed. The lateral leg wound is now nearly flush with the skin surface. There is good granulation tissue present  with a light layer of slough. 01/19/2022: Due to the way his wrap was placed, the patient did not change the dressing on his thigh at all and so the foam was saturated and his skin is macerated. There is a light layer of slough on the wound surface. The underlying granulation tissue is robust and healthy-appearing. He has heavy callus buildup at the site of his first metatarsal head wound which is still healed. 02/01/2022: He has been in silver alginate. When he removed the dressing from his thigh wound, however, some leg, superficially reopening a portion of the wound that had healed. In addition, underneath the callus at his left first metatarsal head, there appears to be a blister and the wound appears to be open again. 02/08/2022: The lateral leg wound has contracted substantially. There is eschar and a light layer  of slough present. He says that it is starting to pull and is uncomfortable. On inspection, there is some puckering of the scar and the eschar is quite dry; this may account for his symptoms. On his first metatarsal head, the wound is much smaller with just some eschar on the surface. The callus has not reaccumulated. He reports that he had a blister come up on his medial thigh wound at the distal aspect. It popped and there is now an opening in his skin again. Looking back through his library of wound photos, there is what [name], [name] ([id number]) [id number] Page 15 of 21 looks like a permanent suture just deep to this location and it may be trying to erode through. We have been using silver alginate on his wounds. 02/15/2022: The lateral leg wound is about half the size it was last week. It is clean with just a little perimeter eschar and light slough. The wound on his first metatarsal head is about the same with heavy callus overlying it. The medial thigh wound is closed again. He does have some skin changes on the top of his foot that looks potentially yeast related. 02/22/2022: The skin on the top of his foot improved with the use of a topical antifungal. The lateral leg wound continues to contract and is again smaller this week. There is a little bit of slough and eschar on the surface. The first metatarsal head wound is a little bit smaller but has reaccumulated a thick callus over the top. He decided to try to trim his toenail and ultimately took the entire nail off of his left great toe. 03/02/2022: His lateral leg wound continues to improve, as does the wound on his left great toe. Unfortunately, it appears that somehow his foot got wet and moisture seeped in through the opening causing his skin to lift. There is a large wound now overlying his first metatarsal on both the plantar, medial, and dorsal portion of his foot. There is necrotic tissue and slough present  underneath the shaggy macerated skin. 03/08/2022: The lateral leg wound is smaller again today. There is just a light layer of slough and eschar on the surface. The great toe wound is smaller again today. The first metatarsal wound is a little bit smaller today and does not look nearly as necrotic and macerated. There is still slough and nonviable tissue present. 03/15/2022: The lateral leg wound is narrower and just has a little bit of light slough buildup. The first metatarsal wound still has a fair amount of moisture affecting the periwound skin. The great toe wound is healed. 03/22/2022: The lateral leg wound is now  isolated to just at the level of his knee. There is some eschar and slough accumulation. The first metatarsal head wound has epithelialized tremendously and is about half the size that it was last week. He still has some maceration on the top of his foot and a fungal odor is present. 03/29/2022: T oday the patient's foot was macerated, suggesting that the cast got wet. The patient has also been picking at his dry skin and has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

## 2022-12-13 ENCOUNTER — Encounter (HOSPITAL_BASED_OUTPATIENT_CLINIC_OR_DEPARTMENT_OTHER): Payer: Medicare Other | Admitting: General Surgery

## 2022-12-13 DIAGNOSIS — E11621 Type 2 diabetes mellitus with foot ulcer: Secondary | ICD-10-CM | POA: Diagnosis not present

## 2022-12-13 NOTE — Progress Notes (Signed)
[name], [name] ([id number]) [id number] Page 1 of 9 Visit Report for 12/13/2022 Arrival Information Details Patient Name: Date of Service: [name], [name] 12/13/2022 10:00 A M Medical Record Number: [id number] Patient Account Number: [id number] Date of Birth/Sex: Treating RN: 04-23-1951 (72 y.o. [name] Primary Care [name]: [name] Other Clinician: Referring [name]: Treating [name]/Extender: Peggye [name] in Treatment: 73 Visit Information History Since Last Visit Added or deleted any medications: No Patient Arrived: Ambulatory Any new allergies or adverse reactions: No Arrival Time: 10:02 Had a fall or experienced change in No Accompanied By: self activities of daily living that may affect Transfer Assistance: None risk of falls: Patient Requires Transmission-Based Precautions: No Signs or symptoms of abuse/neglect since last visito No Patient Has Alerts: Yes Hospitalized since last visit: No Has Compression in Place as Prescribed: Yes Pain Present Now: No Electronic Signature(s) Signed: 12/13/2022 3:30:29 PM By: Tommie Ard RN Entered By: Tommie Ard on 12/13/2022 10:03:02 -------------------------------------------------------------------------------- Compression Therapy Details Patient Name: Date of Service: [name]. 12/13/2022 10:00 A M Medical Record Number: [id number] Patient Account Number: [id number] Date of Birth/Sex: Treating RN: 07-28-1950 (72 y.o. [name] Primary Care [name]: [name] Other Clinician: Referring [name]: Treating [name]/Extender: Peggye [name] in Treatment: 87 Compression Therapy Performed for Wound Assessment: Wound #18R Left,Plantar Metatarsal head first Performed By: Clinician Tommie Ard, RN Compression Type: Four Layer Post Procedure Diagnosis Same as Pre-procedure Electronic Signature(s) Signed: 12/13/2022 3:30:29 PM By:  Tommie Ard RN Entered By: Tommie Ard on 12/13/2022 10:24:22 -------------------------------------------------------------------------------- Encounter Discharge Information Details Patient Name: Date of Service: [name]. 12/13/2022 10:00 A M Medical Record Number: [id number] Patient Account Number: [id number] [name], [name] ([id number]) [id number] Page 2 of 9 Date of Birth/Sex: Treating RN: 08-28-1950 (72 y.o. [name] Primary Care Makenzy Krist: Other Clinician: Ralene [name] Referring [name]: Treating [name]/Extender: Peggye [name] in Treatment: 74 Encounter Discharge Information Items Post Procedure Vitals Discharge Condition: Stable Temperature (F): 98.0 Ambulatory Status: Ambulatory Pulse (bpm): 63 Discharge Destination: Home Respiratory Rate (breaths/min): 18 Transportation: Private Auto Blood Pressure (mmHg): 117/65 Accompanied By: self Schedule Follow-up Appointment: Yes Clinical Summary of Care: Electronic Signature(s) Signed: 12/13/2022 3:30:29 PM By: Tommie Ard RN Entered By: Tommie Ard on 12/13/2022 10:41:34 -------------------------------------------------------------------------------- Lower Extremity Assessment Details Patient Name: Date of Service: [name], [name] 12/13/2022 10:00 A M Medical Record Number: [id number] Patient Account Number: [id number] Date of Birth/Sex: Treating RN: [birth year] (72 y.o. [name] Primary Care [name]: [name] Other Clinician: Referring Jameela Michna: Treating [name]/Extender: Peggye [name] in Treatment: 87 Edema Assessment Assessed: [Left: No] [Right: No] Edema: [Left: Ye] [Right: s] Calf Left: Right: Point of Measurement: 41 cm From Medial Instep 47.8 cm Ankle Left: Right: Point of Measurement: 10 cm From Medial Instep 29 cm Vascular Assessment Pulses: Dorsalis Pedis Palpable: [Left:Yes] Electronic  Signature(s) Signed: 12/13/2022 3:30:29 PM By: Tommie Ard RN Entered By: Tommie Ard on 12/13/2022 10:11:18 -------------------------------------------------------------------------------- Multi Wound Chart Details Patient Name: Date of Service: [name]. 12/13/2022 10:00 A M Medical Record Number: [id number] Patient Account Number: [id number] Date of Birth/Sex: Treating RN: 10/25/1950 (72 y.o. M) Primary Care [name]: [name] Other Clinician: Glynn [name] ([id number]) [id number] Page 3 of 9 Referring Stellar Gensel: Treating [name]/Extender: Peggye [name] in Treatment: 87 Vital Signs Height(in): 74 Capillary Blood Glucose(mg/dl): 433 Weight(lbs): 295 Pulse(bpm): 63 Body Mass Index(BMI): 30.6 Blood Pressure(mmHg): 117/65 Temperature(F): 98.0 Respiratory Rate(breaths/min): 18 [  18R:Photos:] [N/A:N/A] Left, Plantar Metatarsal head first Left, Proximal, Lateral Lower Leg N/A Wound Location: Gradually Appeared Bump N/A Wounding Event: Diabetic Wound/Ulcer of the Lower Cyst N/A Primary Etiology: Extremity Glaucoma, Sleep Apnea, Glaucoma, Sleep Apnea, N/A Comorbid History: Hypertension, Peripheral Arterial Hypertension, Peripheral Arterial Disease, Peripheral Venous Disease,Disease, Peripheral Venous Disease, Type II Diabetes, Gout, Osteoarthritis, Type II Diabetes, Gout, Osteoarthritis, Neuropathy Neuropathy 08/23/2020 06/03/2021 N/A Date Acquired: 28 79 N/A Weeks of Treatment: Open Open N/A Wound Status: Yes No N/A Wound Recurrence: No Yes N/A Clustered Wound: N/A 3 N/A Clustered Quantity: 1.4x1.8x0.2 0.4x0.2x0.1 N/A Measurements L x W x D (cm) 1.979 0.063 N/A A (cm) : rea 0.396 0.006 N/A Volume (cm) : 86.00% 96.20% N/A % Reduction in A rea: 86.00% 99.50% N/A % Reduction in Volume: Grade 2 Full Thickness With Exposed Support N/A Classification: Structures Medium Medium N/A Exudate A  mount: Serosanguineous Serous N/A Exudate Type: red, brown amber N/A Exudate Color: Thickened Fibrotic scar, thickened scar N/A Wound Margin: Medium (34-66%) Large (67-100%) N/A Granulation A mount: Red Red N/A Granulation Quality: Medium (34-66%) Small (1-33%) N/A Necrotic A mount: Eschar, Adherent Slough Eschar, Adherent Slough N/A Necrotic Tissue: Fat Layer (Subcutaneous Tissue): Yes Fat Layer (Subcutaneous Tissue): Yes N/A Exposed Structures: Fascia: No Fascia: No Tendon: No Tendon: No Muscle: No Muscle: No Joint: No Joint: No Bone: No Bone: No Small (1-33%) Large (67-100%) N/A Epithelialization: Debridement - Selective/Open Wound Debridement - Selective/Open Wound N/A Debridement: Pre-procedure Verification/Time Out 10:20 10:20 N/A Taken: Lidocaine 5% topical ointment Lidocaine 5% topical ointment N/A Pain Control: Necrotic/Eschar, Callus Necrotic/Eschar, Slough N/A Tissue Debrided: Skin/Epidermis Non-Viable Tissue N/A Level: 1.98 0.06 N/A Debridement A (sq cm): rea Curette Curette N/A Instrument: Minimum Minimum N/A Bleeding: Pressure Pressure N/A Hemostasis A chieved: Procedure was tolerated well Procedure was tolerated well N/A Debridement Treatment Response: 1.4x1.8x0.2 0.4x0.2x0.1 N/A Post Debridement Measurements L x W x D (cm) 0.396 0.006 N/A Post Debridement Volume: (cm) Callus: Yes Scarring: Yes N/A Periwound Skin Texture: Maceration: Yes Dry/Scaly: Yes N/A Periwound Skin Moisture: Dry/Scaly: No No Abnormalities Noted Hemosiderin Staining: Yes N/A Periwound Skin Color: No Abnormality No Abnormality N/A Temperature: Compression Therapy Debridement N/A Procedures Performed: Debridement Treatment Notes DIAMOND, OLIVARES ([id number]) [id number] Page 4 of 9 Electronic Signature(s) Signed: 12/13/2022 10:37:10 AM By: [name] MD FACS Entered By: [name] on 12/13/2022  10:37:09 -------------------------------------------------------------------------------- Multi-Disciplinary Care Plan Details Patient Name: Date of Service: [name]. 12/13/2022 10:00 A M Medical Record Number: [id number] Patient Account Number: [id number] Date of Birth/Sex: Treating RN: July 25, 1950 (72 y.o. [name] Primary Care Danaka Llera: [name] Other Clinician: Referring Mylan Lengyel: Treating Tenzin Pavon/Extender: Peggye [name] in Treatment: 42 Multidisciplinary Care Plan reviewed with physician Active Inactive Venous Leg Ulcer Nursing Diagnoses: Knowledge deficit related to disease process and management Potential for venous Insuffiency (use before diagnosis confirmed) Goals: Patient will maintain optimal edema control Date Initiated: 07/27/2021 Target Resolution Date: 02/23/2023 Goal Status: Active Interventions: Assess peripheral edema status every visit. Treatment Activities: Therapeutic compression applied : 07/27/2021 Notes: Wound/Skin Impairment Nursing Diagnoses: Impaired tissue integrity Knowledge deficit related to ulceration/compromised skin integrity Goals: Patient will have a decrease in wound volume by X% from date: (specify in notes) Date Initiated: 04/12/2021 Date Inactivated: 01/04/2022 Target Resolution Date: 04/23/2021 Goal Status: Met Patient/caregiver will verbalize understanding of skin care regimen Date Initiated: 01/04/2022 Target Resolution Date: 02/23/2023 Goal Status: Active Ulcer/skin breakdown will have a volume reduction of 30% by week 4 Date Initiated: 04/12/2021 Date Inactivated: 04/27/2021 Target  Resolution Date: 04/27/2021 Goal Status: Unmet Unmet Reason: infection Ulcer/skin breakdown will have a volume reduction of 50% by week 8 Date Initiated: 04/27/2021 Date Inactivated: 06/29/2021 Target Resolution Date: 06/24/2021 Goal Status: Met Interventions: Assess patient/caregiver ability to obtain necessary  supplies Assess patient/caregiver ability to perform ulcer/skin care regimen upon admission and as needed Assess ulceration(s) every visit Notes: Electronic Signature(s) Signed: 12/13/2022 10:18:08 AM By: Tommie Ard RN [name] ([id number]) [id number] Page 5 of 9 Signed: 12/13/2022 10:18:08 AM By: Tommie Ard RN Entered By: Tommie Ard on 12/13/2022 10:18:07 -------------------------------------------------------------------------------- Pain Assessment Details Patient Name: Date of Service: [name], STARLIPER 12/13/2022 10:00 A M Medical Record Number: [id number] Patient Account Number: [id number] Date of Birth/Sex: Treating RN: July 22, 1950 (72 y.o. [name] Primary Care Seleta Hovland: [name] Other Clinician: Referring Armel Rabbani: Treating Dara Beidleman/Extender: Peggye [name] in Treatment: 58 Active Problems Location of Pain Severity and Description of Pain Patient Has Paino No Site Locations Rate the pain. Current Pain Level: 0 Pain Management and Medication Current Pain Management: Electronic Signature(s) Signed: 12/13/2022 3:30:29 PM By: Tommie Ard RN Entered By: Tommie Ard on 12/13/2022 10:11:03 -------------------------------------------------------------------------------- Patient/Caregiver Education Details Patient Name: Date of Service: [place], [name] 6/20/2024andnbsp10:00 A M Medical Record Number: [id number] Patient Account Number: [id number] Date of Birth/Gender: Treating RN: April 24, 1951 (72 y.o. [name] Primary Care Physician: [name] Other Clinician: Referring Physician: Treating Physician/Extender: Peggye [name] in Treatment: 94 Education Assessment Education Provided To: Patient Education Topics Provided MAURY, DEININGER ([id number]) [id number] Page 6 of 9 Wound Debridement: Responses: Reinforcements needed, State content  correctly Wound/Skin Impairment: Responses: Reinforcements needed, State content correctly Electronic Signature(s) Signed: 12/13/2022 3:30:29 PM By: Tommie Ard RN Entered By: Tommie Ard on 12/13/2022 10:18:27 -------------------------------------------------------------------------------- Wound Assessment Details Patient Name: Date of Service: [name]. 12/13/2022 10:00 A M Medical Record Number: [id number] Patient Account Number: [id number] Date of Birth/Sex: Treating RN: [birth year] (72 y.o. [name] Primary Care Danelly Hassinger: [name] Other Clinician: Referring Laterica Matarazzo: Treating Thomson Herbers/Extender: Peggye [name] in Treatment: 87 Wound Status Wound Number: 18R Primary Diabetic Wound/Ulcer of the Lower Extremity Etiology: Wound Location: Left, Plantar Metatarsal head first Wound Open Wounding Event: Gradually Appeared Status: Date Acquired: 08/23/2020 Comorbid Glaucoma, Sleep Apnea, Hypertension, Peripheral Arterial Disease, Weeks Of Treatment: 87 History: Peripheral Venous Disease, Type II Diabetes, Gout, Osteoarthritis, Clustered Wound: No Neuropathy Photos Wound Measurements Length: (cm) 1.4 Width: (cm) 1.8 Depth: (cm) 0.2 Area: (cm) 1.979 Volume: (cm) 0.396 % Reduction in Area: 86% % Reduction in Volume: 86% Epithelialization: Small (1-33%) Tunneling: No Undermining: No Wound Description Classification: Grade 2 Wound Margin: Thickened Exudate Amount: Medium Exudate Type: Serosanguineous Exudate Color: red, brown Foul Odor After Cleansing: No Slough/Fibrino Yes Wound Bed Granulation Amount: Medium (34-66%) Exposed Structure Granulation Quality: Red Fascia Exposed: No Necrotic Amount: Medium (34-66%) Fat Layer (Subcutaneous Tissue) Exposed: Yes Necrotic Quality: Eschar, Adherent Slough Tendon Exposed: No Muscle Exposed: No Joint Exposed: No Bone Exposed: No [name] ([id number])  [id number] Page 7 of 9 Periwound Skin Texture Texture Color No Abnormalities Noted: No No Abnormalities Noted: Yes Callus: Yes Temperature / Pain Temperature: No Abnormality Moisture No Abnormalities Noted: No Dry / Scaly: No Maceration: Yes Treatment Notes Wound #18R (Metatarsal head first) Wound Laterality: Plantar, Left Cleanser Soap and Water Discharge Instruction: May shower and wash wound with dial antibacterial soap and water prior to dressing change. Wound Cleanser Discharge Instruction: Cleanse the wound with wound cleanser prior to  applying a clean dressing using gauze sponges, not tissue or cotton balls. Peri-Wound Care Ketoconazole Cream 2% Discharge Instruction: Apply Ketoconazole as directed Topical Primary Dressing Maxorb Extra Ag+ Alginate Dressing, 2x2 (in/in) Discharge Instruction: Apply to wound bed as instructed Secondary Dressing Optifoam Non-Adhesive Dressing, 4x4 in Discharge Instruction: Apply over primary dressing as directed. Woven Gauze Sponges 2x2 in Discharge Instruction: Apply over primary dressing as directed. Zetuvit Absorbent Pad, 4x4 (in/in) Secured With Compression Wrap Urgo K2, (equivalent to a 4 layer) two layer compression system, regular Discharge Instruction: Apply Urgo K2 as directed (alternative to 4 layer compression). Compression Stockings Add-Ons Electronic Signature(s) Signed: 12/13/2022 3:30:29 PM By: Tommie Ard RN Entered By: Tommie Ard on 12/13/2022 10:14:34 -------------------------------------------------------------------------------- Wound Assessment Details Patient Name: Date of Service: [name]. 12/13/2022 10:00 A M Medical Record Number: [id number] Patient Account Number: [id number] Date of Birth/Sex: Treating RN: Apr 21, 1951 (72 y.o. [name] Primary Care Sundee Garland: [name] Other Clinician: Referring Moksha Dorgan: Treating Tad Fancher/Extender: Peggye [name] in Treatment: 87 Wound Status Wound Number: 22 Primary Cyst Etiology: Wound Location: Left, Proximal, Lateral Lower Leg Wound Open Wounding Event: Bump Status: Date Acquired: 06/03/2021 Comorbid Glaucoma, Sleep Apnea, Hypertension, Peripheral Arterial Disease, Weeks Of Treatment: 79 History: Peripheral Venous Disease, Type II Diabetes, Gout, Osteoarthritis, Clustered Wound: Yes Neuropathy DACORION, ASMAR ([id number]) [id number] Page 8 of 9 Photos Wound Measurements Length: (cm) Width: (cm) Depth: (cm) Clustered Quantity: Area: (cm) Volume: (cm) 0.4 % Reduction in Area: 96.2% 0.2 % Reduction in Volume: 99.5% 0.1 Epithelialization: Large (67-100%) 3 Tunneling: No 0.063 Undermining: No 0.006 Wound Description Classification: Full Thickness With Exposed Support Structures Wound Margin: Fibrotic scar, thickened scar Exudate Amount: Medium Exudate Type: Serous Exudate Color: amber Foul Odor After Cleansing: No Slough/Fibrino Yes Wound Bed Granulation Amount: Large (67-100%) Exposed Structure Granulation Quality: Red Fascia Exposed: No Necrotic Amount: Small (1-33%) Fat Layer (Subcutaneous Tissue) Exposed: Yes Necrotic Quality: Eschar, Adherent Slough Tendon Exposed: No Muscle Exposed: No Joint Exposed: No Bone Exposed: No Periwound Skin Texture Texture Color No Abnormalities Noted: No No Abnormalities Noted: No Scarring: Yes Hemosiderin Staining: Yes Moisture Temperature / Pain No Abnormalities Noted: No Temperature: No Abnormality Dry / Scaly: Yes Treatment Notes Wound #22 (Lower Leg) Wound Laterality: Left, Lateral, Proximal Cleanser Soap and Water Discharge Instruction: May shower and wash wound with dial antibacterial soap and water prior to dressing change. Wound Cleanser Discharge Instruction: Cleanse the wound with wound cleanser prior to applying a clean dressing using gauze sponges, not tissue or cotton  balls. Peri-Wound Care Skin Prep Discharge Instruction: Use skin prep as directed Sween Lotion (Moisturizing lotion) Discharge Instruction: Apply moisturizing lotion to the leg Topical Skintegrity Hydrogel 4 (oz) Discharge Instruction: Apply hydrogel as directed Primary Dressing Endoform 2x2 in Discharge Instruction: Moisten with Hydrogel or saline Secondary Dressing MANMEET, MCQUAIDE ([id number]) [id number] Page 9 of 9 Zetuvit Plus 4x8 in Discharge Instruction: Apply over primary dressing as directed. Secured With Elastic Bandage 4 inch (ACE bandage) Discharge Instruction: Secure with ACE bandage as directed. Compression Wrap Compression Stockings Add-Ons Electronic Signature(s) Signed: 12/13/2022 3:30:29 PM By: Tommie Ard RN Entered By: Tommie Ard on 12/13/2022 10:15:02 -------------------------------------------------------------------------------- Vitals Details Patient Name: Date of Service: [name]. 12/13/2022 10:00 A M Medical Record Number: [id number] Patient Account Number: [id number] Date of Birth/Sex: Treating RN: 1951/04/04 (72 y.o. [name] Primary Care Decoda Van: [name] Other Clinician: Referring Shaila Gilchrest: Treating Hikaru Delorenzo/Extender: Peggye [name] in Treatment: 69 Vital Signs Time  Taken: 10:03 Temperature (F): 98.0 Height (in): 74 Pulse (bpm): 63 Weight (lbs): 238 Respiratory Rate (breaths/min): 18 Body Mass Index (BMI): 30.6 Blood Pressure (mmHg): 117/65 Capillary Blood Glucose (mg/dl): 161 Reference Range: 80 - 120 mg / dl Electronic Signature(s) Signed: 12/13/2022 3:30:29 PM By: Tommie Ard RN Entered By: Tommie Ard on 12/13/2022 10:10:42

## 2022-12-13 NOTE — Progress Notes (Signed)
[name], [name] ([id number]) [id number] Page 1 of 21 Visit Report for 12/13/2022 Chief Complaint Document Details Patient Name: Date of Service: [name], [name] 12/13/2022 10:00 A M Medical Record Number: [id number] Patient Account Number: [id number] Date of Birth/Sex: Treating RN: April 06, 1951 (72 y.o. M) Primary Care Provider: [name] Other Clinician: Referring Provider: Treating Provider/Extender: [name] in Treatment: 87 Information Obtained from: Patient Chief Complaint Left leg and foot ulcers 04/12/2021; patient is here for wounds on his left lower leg and left plantar foot over the first metatarsal head Electronic Signature(s) Signed: 12/13/2022 10:37:20 AM By: [name] Guess MD FACS Entered By: [name] on 12/13/2022 10:37:20 -------------------------------------------------------------------------------- Debridement Details Patient Name: Date of Service: [name]. 12/13/2022 10:00 A M Medical Record Number: [id number] Patient Account Number: [id number] Date of Birth/Sex: Treating RN: [birth year] (72 y.o. [name] Primary Care Provider: [name] Other Clinician: Referring Provider: Treating Provider/Extender: [name] in Treatment: 87 Debridement Performed for Assessment: Wound #22 Left,Proximal,Lateral Lower Leg Performed By: Physician [name] Guess, MD Debridement Type: Debridement Level of Consciousness (Pre-procedure): Awake and Alert Pre-procedure Verification/Time Out Yes - 10:20 Taken: Start Time: 10:21 Pain Control: Lidocaine 5% topical ointment Percent of Wound Bed Debrided: 100% T Area Debrided (cm): otal 0.06 Tissue and other material debrided: Non-Viable, Eschar, Slough, Slough Level: Non-Viable Tissue Debridement Description: Selective/Open Wound Instrument: Curette Bleeding: Minimum Hemostasis Achieved: Pressure Response to Treatment:  Procedure was tolerated well Level of Consciousness (Post- Awake and Alert procedure): Post Debridement Measurements of Total Wound Length: (cm) 0.4 Width: (cm) 0.2 Depth: (cm) 0.1 Volume: (cm) 0.006 Character of Wound/Ulcer Post Debridement: Requires Further Debridement Post Procedure Diagnosis Same as [name] ([id number]) [id number] Page 2 of 21 Notes Scribed for Dr. Lady Gary by Tommie Ard, RN Electronic Signature(s) Signed: 12/13/2022 11:31:33 AM By: [name] Guess MD FACS Signed: 12/13/2022 3:30:29 PM By: Tommie Ard RN Entered By: Tommie [name] on 12/13/2022 10:22:18 -------------------------------------------------------------------------------- Debridement Details Patient Name: Date of Service: [name]. 12/13/2022 10:00 A M Medical Record Number: [id number] Patient Account Number: [id number] Date of Birth/Sex: Treating RN: [birth year] (72 y.o. [name] Primary Care Provider: [name] Other Clinician: Referring Provider: Treating Provider/Extender: [name] in Treatment: 87 Debridement Performed for Assessment: Wound #18R Left,Plantar Metatarsal head first Performed By: Physician [name] Guess, MD Debridement Type: Debridement Severity of Tissue Pre Debridement: Fat layer exposed Level of Consciousness (Pre-procedure): Awake and Alert Pre-procedure Verification/Time Out Yes - 10:20 Taken: Start Time: 10:21 Pain Control: Lidocaine 5% topical ointment Percent of Wound Bed Debrided: 100% T Area Debrided (cm): otal 1.98 Tissue and other material debrided: Non-Viable, Callus, Eschar, Skin: Epidermis Level: Skin/Epidermis Debridement Description: Selective/Open Wound Instrument: Curette Bleeding: Minimum Hemostasis Achieved: Pressure Response to Treatment: Procedure was tolerated well Level of Consciousness (Post- Awake and Alert procedure): Post Debridement  Measurements of Total Wound Length: (cm) 1.4 Width: (cm) 1.8 Depth: (cm) 0.2 Volume: (cm) 0.396 Character of Wound/Ulcer Post Debridement: Requires Further Debridement Severity of Tissue Post Debridement: Fat layer exposed Post Procedure Diagnosis Same as Pre-procedure Notes Scribed for Dr. Lady Gary by Tommie Ard, RN Electronic Signature(s) Signed: 12/13/2022 11:31:33 AM By: [name] Guess MD FACS Signed: 12/13/2022 3:30:29 PM By: Tommie Ard RN Entered By: Tommie [name] on 12/13/2022 10:24:01 HPI Details -------------------------------------------------------------------------------- [name] ([id number]) [id number] Page 3 of 21 Patient Name: Date of Service: [name], [name] 12/13/2022 10:00 A M Medical Record Number: [id number] Patient Account Number: [id number]  Date of Birth/Sex: Treating RN: [birth year] (72 y.o. M) Primary Care Provider: [name] Other Clinician: Referring Provider: Treating Provider/Extender: [name] in Treatment: 36 History of Present Illness HPI Description: 10/11/17; Mr. Hohimer is a 72 year old man who tells me that in 2015 he slipped down the latter traumatizing his left leg. He developed a wound in the same spot the area that we are currently looking at. He states this closed over for the most part although he always felt it was somewhat unstable. In 2016 he hit the same area with the door of his car had this reopened. He tells me that this is never really closed although sometimes an inflow it remains open on a constant basis. He has not been using any specific dressing to this except for topical antibiotics the nature of which were not really sure. His primary doctor did send him to see Dr. Jacinto Halim of interventional cardiology. He underwent an angiogram on 08/06/17 and he underwent a PTA and directional atherectomy of the lesser distal SFA and popliteal arteries which resulted in brisk  improvement in blood flow. It was noted that he had 2 vessel runoff through the anterior tibial and peroneal. He is also been to see vascular and interventional radiologist. He was not felt to have any significant superficial venous insufficiency. Presumably is not a candidate for any ablation. It was suggested he come here for wound care. The patient is a type II diabetic on insulin. He also has a history of venous insufficiency. ABIs on the left were noncompressible in our clinic 10/21/17; patient we admitted to the clinic last week. 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 We put Iodosorb on him after an aggressive debridement and 3 layer compression. He complained of pain in his ankle and itching with is skin in fact he scratched the area on the medial calf superiorly at the rim of our wraps and he has 2 small open areas in that location today which are new. I changed his primary dressing today to silver collagen. As noted he is already had revascularization and does not have any significant superficial venous insufficiency that would be amenable to ablation 10/28/17; patient admitted to the clinic 2 weeks ago. He has a smaller Wound. Scratch injury from last week revealed. There is large wound over the tibial area. This is smaller. Granulation looks healthy. No need for debridement. 11/04/17; the wound on the left lateral calf looks better. Improved dimensions. Surface of this looks better. We've been maintaining him and Kerlix Coban wraps. He finds this much more comfortable. Silver collagen dressing 11/11/17; left lateral Wound continues to look healthy be making progress. Using a #5 curet I removed removed nonviable skin from the surface of the wound and then necrotic debris from the wound surface. Surface of the wound continues to look healthy. He also has an open area on the left great toenail bed. We've been using topical  antibiotics. 11/19/17; left anterior lateral wound continues to look healthy but it's not closed. He also had a small wound above this on the left leg Initially traumatic wounds in the setting of significant chronic venous insufficiency and stasis dermatitis 11/25/17; left anterior wounds superiorly is closed still a small wound inferiorly. 12/02/17; left anterior tibial area. Arrives today with adherent callus. Post debridement clearly not completely closed. Hydrofera Blue under 3 layer compression. 12/09/17; left anterior tibia. Circumferential eschar however the wound bed  looks stable to improved. We've been using Hydrofera Blue under 3 layer compression 12/17/17; left anterior tibia. Apparently this was felt to be closed however when the wrap was taken off there is a skin tear to reopen wounds in the same area we've been using Hydrofera Blue under 3 layer compression 12/23/17 left anterior tibia. Not close to close this week apparently the [facility] was stuck to this again. Still circumferential eschar requiring debridement. I put a contact layer on this this time under the Hydrofera Blue 12/31/17; left anterior tibia. Wound is better slight amount of hyper-granulation. Using Hydrofera Blue over Adaptic. 01/07/18; left anterior tibia. The wound had some surface eschar however after this was removed he has no open wound.he was already revascularized by Dr. Jacinto Halim when he came to our clinic with atherectomy of the left SFA and popliteal artery. He was also sent to interventional radiology for venous reflux studies. He was not felt to have significant reflux but certainly has chronic venous changes of his skin with hemosiderin deposition around this area. He will definitely need to lubricate his skin and wear compression stocking and I've talked to him about this. READMISSION 05/26/2018 This is a now 72 year old man we cared for with traumatic wounds on his left anterior lower extremity. He had been  previously revascularized during that admission by Dr. Jacinto Halim. Apparently in follow-up Dr. Jacinto Halim noted that he had deterioration in his arterial status. He underwent a stent placement in the distal left SFA on 04/22/2018. 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 He went back to the angiography suite on 04/30/2018 he underwent PTA and balloon angioplasty of the occluded left mid anterior tibial artery, thrombotic occlusion went from 100 to 0% which reconstitutes the posterior tibial artery. He had thrombectomy and aspiration of the peroneal artery. The stent placed in the distal SFA left SFA was still occluded. 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 He has been recuperating at [facility] in [facility] after the most recent hospitalization. He is going home tomorrow. He tells me that 2 weeks ago he traumatized the tip of his left fifth toe. He came in urgently for our review of this. This was a history of before I noted that Dr. Jacinto Halim had already noted dry gangrenous changes of the left fifth toe 06/09/2018; 2-week follow-up. I did contact Dr. Jacinto Halim after his last appointment and he apparently saw 1 of Dr. Verl Dicker colleagues the next day. He does not follow-up with Dr. Jacinto Halim himself until Thursday of this week. 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 Nevertheless there is no evidence of infection no drainage and no pain. He had a new area that this week when we were signing him in today on the left anterior mid tibia area, this is in close proximity to the previous wound we have dealt with in this clinic. 06/23/2018; 2-week follow-up. I did not receive a recent note from Dr. Jacinto Halim to review today. Our  office is trying to obtain this. He is apparently not planning to do further vascular interventions and wondered about compression to try and help with the patient's chronic venous insufficiency. However we are also concerned about the arterial flow. He  arrives in clinic today with a new area on the left third toe. The areas on the calf/anterior tibia are close to closing. The left fifth toe is still mummified using Betadine. -In reviewing things with the patient he has what sounds like claudication with mild to moderate amount of activity. 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 I prescribed Levaquin over the phone while we attempted to arrange a plan of care. However the patient called yesterday to report he had low-grade fever and he came in today acutely. There is been a marked deterioration in the left third toe with spreading cellulitis up into the dorsal left foot. He was referred to the emergency room. Readmission: 06/29/2020 patient presents today for reevaluation here in our clinic he was previously treated by Dr. Leanord Hawking at the latter part of 2019 in 2 the beginning of 2020. Subsequently we have not seen him since that time in the interim he did have evaluation with vein and vascular specialist specifically Dr. Bo Mcclintock who did perform quite extensive work for a left femoral to anterior tibial artery bypass. With that being said in the interim the patient has developed significant lymphedema and has wounds that he tells me have really never healed in regard to the incision site on the left leg. He also has multiple wounds on the feet for various reasons some of which is that he tends to pick at his feet. Fortunately there is no signs of active infection systemically at this time he does have HUNTER, TRINGALE ([id number]) [id number] Page 4 of 21 some wounds that are little bit deeper but most are fairly superficial he seems to  have good blood flow and overall everything appears to be healthy I see no bone exposed and no obvious signs of osteomyelitis. I do not know that he necessarily needs a x-ray at this point although that something we could consider depending on how things progress. The patient does have a history of lymphedema, diabetes, this is type II, chronic kidney disease stage III, hypertension, and history of peripheral vascular disease. 07/05/2020; patient admitted last week. Is a patient I remember from 2019 he had a spreading infection involving the left foot and we sent him to the hospital. 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 He subsequently had a left femoral to anterior tibial bypass by Dr.Cain vein and vascular. He also has severe lymphedema with chronic skin changes related to that on the left leg. The most problematic area that was new today was on the left medial great toe. This was apparently a small area last week there was purulent drainage which our intake nurse cultured. Also areas on the left medial foot and heel left lateral foot. He has 2 areas on the left medial calf left lateral calf in the setting of the severe lymphedema. 07/13/2020 on evaluation today patient appears to be doing better in my opinion compared to his last visit. The good news is there is no signs of active infection systemically and locally I do not see any signs of infection either. He did have an x-ray which was negative that is great news he had a culture which showed MRSA but at the same time he is been on the doxycycline which has helped. I do think we may want to extend this for 7 additional days 1/25; patient admitted to the clinic a few weeks ago. 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 We have been putting him under compression his edema control is a lot better but he is severe verricused skin on the left leg. He is really  done quite well he still has an open area on the left medial calf and the left medial first metatarsal head. We have been using silver collagen on the leg silver alginate on the foot 07/27/2020 upon evaluation today patient appears to be doing decently well in regard to his wounds. He still has a lot of dry skin on the left leg. Some of this is starting to peel back and I think he may be able to have them out by removing some that today. Fortunately there is no signs of active infection at this time on the left leg although on the right leg he does appear to have swelling and erythema as well as some mild warmth to touch. This does have been concerned about the possibility of cellulitis although within the differential diagnosis I do think that potentially a DVT has to be at least considered. We need to rule that out before proceeding would just call in the cellulitis. Especially since he is having pain in the posterior aspect of his calf muscle. 2/8; the patient had seen sparingly. He has severe skin changes of chronic lymphedema in the left leg thickened hyperkeratotic verrucous skin. He has an open wound on the medial part of the left first met head left mid tibia. He also has a rim of nonepithelialized skin in the anterior mid tibia. He brought in the AmLactin lotion that was been prescribed although I am not sure under compression and its utility. There concern about cellulitis on the right lower leg the last time he was here. He was put on on antibiotics. His DVT rule out was negative. The right leg looks fine he is using his stocking on this area 08/10/2020 upon evaluation today patient appears to be doing well with regard to his leg currently. He has been tolerating the dressing changes without complication. Fortunately there is no signs of active infection which is great news. Overall very pleased with where things stand. 2/22; the patient still has an area on the medial part of the left first  met his head. This looks better than when I last saw this earlier this month he has a rim of epithelialization but still some surface debris. Mostly everything on the left leg is healed. There is still a vulnerable in the left mid tibia area. 08/30/2020 upon evaluation today patient appears to be doing much better in regard to his wounds on his foot. 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 Nonetheless I think we will address that today I Minna send in a prescription for him in that regard. Overall though there does not appear to be any signs of significant worsening. 09/07/2020 on evaluation today patient's wounds over his left foot appear to be doing excellent. I do not see any signs of infection there is some callus buildup this can require debridement for certain but overall I feel like he is managing quite nicely. He still using the AmLactin cream which has been beneficial for him as well. 3/22; left foot wound is closed. There is no open area here. He is using ammonium lactate lotion to the lower extremities to help exfoliate dry  cracked skin. He has compression stockings from elastic therapy in [place]. The wound on the medial part of his left first met head is healed today. READMISSION 04/12/2021 Mr. Ruud is a patient we know fairly well he had a prolonged stay in clinic in 2019 with wounds on his left lateral and left anterior lower extremity in the setting of chronic venous insufficiency. More recently he was here earlier this year with predominantly an area on his left foot first metatarsal head plantar and he says the plantar foot broke down on its not long after we discharged him but he did not come back here. The last few months areas of broken down on his left anterior and again the left lateral lower extremity. The leg itself is very swollen  chronically enlarged a lot of hyperkeratotic dry Berry Q skin in the left lower leg. His edema extends well into the thigh. He was seen by Dr. Randie Heinz. He had ABIs on 03/02/2021 showing an ABI on the right of 1 with a TBI of 0.72 his ABI in the left at 1.09 TBI of 0.99. Monophasic and biphasic waveforms on the right. On the left monophasic waveforms were noted he went on to have an angiogram on 03/27/2021 this showed the aortic aortic and iliac segments were free of flow-limiting stenosis the left common femoral vein to evaluate the left femoral to anterior tibial artery bypass was unobstructed the bypass was patent without any areas of stenosis. We discharged the patient in bilateral juxta lite stockings but very clearly that was not sufficient to control the swelling and maintain skin integrity. He is clearly going to need compression pumps. 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 This is fortunate because he is on his feet for long periods of time. 10/27; patient comes in with our intake nurse reporting copious amount of green drainage from the left anterior mid tibia the left dorsal foot and to a lesser extent the left medial mid tibia. We left the compression wrap on all week for the amount of edema in his left leg is quite a bit better. We use silver alginate as the primary dressing 11/3; edema control is good. Left anterior lower leg left medial lower leg and the plantar first metatarsal head. The left anterior lower leg required debridement. Deep tissue culture I did of this wound showed MRSA I put him on 10 days of doxycycline which she will start today. We have him in compression wraps. He has a security card and [employer] however he is retiring on November 15. We will need to then get him into a better offloading boot for the left foot perhaps a total contact cast 11/10; edema control is quite good. Left anterior and left medial lower leg  wounds in the setting of chronic venous insufficiency and lymphedema. He also has a substantial area over the left plantar first metatarsal head. 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 He has significant hypergranulation on the left plantar foot wound. The patient is a diabetic but he does not have significant PAD 11/17; edema control is quite good. Left anterior and left medial lower leg wounds look better. The really concerning area remains the area on the left plantar first metatarsal head. He has a rim of epithelialization. He has been using a surgical shoe The patient  is now retired from a a [employer] I have gone over with him the need to offload this area aggressively. Starting today with a forefoot off loader but . possibly a total contact cast. 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 Arrives with a very significant odor from I think all of the wounds on the left leg and the left foot. Because of this I did not put a total contact cast on him today but will could still consider this. His wife was having cataract surgery which is the reason he missed the appointment 12/6. I saw this man 5 days ago with a swelling below the popliteal fossa. I thought he actually might have a Baker's cyst however the DVT rule out study that we could arrange right away was negative the technician told me this was not a ruptured Baker's cyst. We attempted to get this aspirated by under ultrasound guidance in interventional radiology however all they did was an ultrasound however it shows an extensive fluid collection 62 x 8 x 9.4 in the left thigh and left calf. The patient states he thinks this started 8 days ago or so but he really is not complaining of any pain, fever or systemic symptoms. He has not  ha 12/20; after some difficulty I managed to get the patient into see Dr. Randie Heinz. Eventually he was taken into the hospital and had a drain put in the fluid collection below his left knee posteriorly extending into the posterior thigh. He still has the drain in place. Culture of this showed moderate staff aureus few Morganella [name], [name] ([id number]) [id number] Page 5 of 21 and few Klebsiella he is now on doxycycline and ciprofloxacin as suggested by infectious disease he is on this for a month. The drain will remain in place until it stops draining 12/29; he comes in today with the 1 wound on his left leg and the area on the left plantar first met head significantly smaller. Both look healthy. He still has the drain in the left leg. He says he has to change this daily. Follows up with Dr. Randie Heinz on January 11. 06/29/2021; the wounds that I am following on the left leg and left first met head continued to be quite healthy. However the area where his inferior drain is in place had copious amounts of drainage which was green in color. The wound here is larger. Follows up with Dr. Pascal Lux of vein and vascular his surgeon next week as well as infectious disease. He remains on ciprofloxacin and doxycycline. He is not complaining of excessive pain in either one of the drain areas 1/12; the patient saw vascular surgery and infectious disease. Vascular surgery has left the drain in place as there was still some notable drainage still see him back in 2 weeks. Dr. Dorthula Perfect stop the doxycycline and ciprofloxacin and I do not believe he follows up with them at this point. Culture I did last week showed both doxycycline resistant MRSA and Pseudomonas not sensitive to ciprofloxacin although only in rare titers 1/19; the patient's wound on the left anterior lower leg is just about healed. We have continued healing of the area that was medially on the left leg. Left first plantar metatarsal head  continues to get smaller. The major problem here is his 2 drain sites 1 on the left upper calf and lateral  thigh. There is purulent drainage still from the left lateral thigh. I gave him antibiotics last week but we still have recultured. He has the drain in the area I think this is eventually going to have to come out. I suspect there will be a connecting wound to heal here perhaps with improved VAc 1/26; the patient had his drain removed by vein and vascular on 1/25/. This was a large pocket of fluid in his left thigh that seem to tunnel into his left upper calf. 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 His mention his Penrose drain was removed today. He now has a tunneling wound on his left calf and left thigh. Both of these probe widely towards each other although I cannot really prove that they connect. Both wounds on his lower leg anteriorly are closed and his area over the first metatarsal head on his right foot continues to improve. We are using Hydrofera Blue here. He also saw infectious disease culture of the abscess they noted was polymicrobial with MRSA, Morganella and Klebsiella he was treated with doxycycline and ciprofloxacin for 4 weeks ending on 07/03/2021. They did not recommend any further antibiotics. Notable that while he still had the Penrose drain in place last week he had purulent drainage coming out of the inferior IandD site this grew [facility], MRSA and Pseudomonas but there does not appear to be any active infection in this area today with the drain out and he is not systemically unwell 2/2; with regards to the drain sites the superior one on the thigh actually is closed down the one on the upper left lateral calf measures about 8 and half centimeters which is an improvement seems to be less prominent although still with a lot of drainage. The only remaining wound is over the first metatarsal head on the left foot and this looks to be continuing to  improve with Hydrofera Blue. 2/9; the area on his plantar left foot continues to contract. Callus around the wound edge. The drain sites specifically have not come down in depth. We put the wound VAC on Monday he changed the canister late last night our intake nurse reported a pocket of fluid perhaps caused by our compression wraps 2/16; continued improvement in left foot plantar wound. drainage site in the calf is not improved in terms of depth (wound vac) 2/23; continued improvement in the left foot wound over the first metatarsal head. With regards to the drain sites the area on his thigh laterally is healed however the open area on his calf is small in terms of circumference by still probes in by about 15 cm. Within using the wound VAC. Hydrofera Blue on his foot 08/24/2021: The left first metatarsal head wound continues to improve. The wound bed is healthy with just some surrounding callus. Unfortunately the open drain site on his calf remains open and tunnels at least 15 cm (the extent of a Q-tip). This is despite several weeks of wound VAC treatment. Based on reading back through the notes, there has been really no significant change in the depth of the wound, although the orifice is smaller and the more cranial wound on his thigh has closed. I suspect the tunnel tracks nearly all the way to this location. 08/31/2021: Continued improvement in the left first metatarsal head wound. There has been absolutely no improvement to the long tunnel from his open drain site on his calf. We have tried to get him into see vascular  surgery sooner to consider the possibility of simply filleting the tract open and allowing it to heal from the bottom up, likely with a wound VAC. They have not yet scheduled a sooner appointment than his current mid April 09/14/2021: He was seen by vascular surgery and they took him to the operating room last week. They opened a portion of the tunnel, but did not extend the entire  length of the known open subcutaneous tract. I read Dr. Darcella Cheshire operative note and it is not clear from that documentation why only a portion of the tract was opened. The heaped up granulation tissue was curetted and removed from at least some portion of the tract. They did place a wound VAC and applied an Unna boot to the leg. The ulcer on his left first metatarsal head is smaller today. The bed looks good and there is just a small amount of surrounding callus. 09/21/2021: The ulcer on his left first metatarsal head looks to be stalled. There is some callus surrounding the wound but the wound bed itself does not appear particularly dynamic. The tunnel tract on his lateral left leg seems to be roughly the same length or perhaps slightly smaller but the wound bed appears healthy with good granulation tissue. He opened up a new wound on his medial thigh and the site of a prior surgical incision. He says that he did this unconsciously in his sleep by scratching. 09/28/2021: Unfortunately, the ulcer on his left first metatarsal head has extended underneath the callus toward the dorsum of his foot. The medial thigh wounds are roughly the same. The tunnel on his lateral left leg continues to be problematic; it is longer than we are able to actually probe with a Q-tip. I am still not certain as to why Dr. Randie Heinz did not open this up entirely when he took the patient to the operating room. We will likely be back in the same situation with just a small superficial opening in a long unhealed tract, as the open portion is granulating in nicely. 10/02/2021: The patient was initially scheduled for a nurse visit, but we are also applying a total contact cast today. The plantar foot wound looks clean without significant accumulated callus. We have been applying Prisma silver collagen to the site. 10/05/2021: The patient is here for his first total contact cast change. We have tried using gauze packing strips in the tunnel on  his lateral leg wound, but this does not seem to be working any better than the white VAC foam. The foot ulcer looks about the same with minimal periwound callus. Medial thigh wound is clean with just some overlying eschar. 10/12/2021: The plantar foot wound is stable without any significant accumulation of periwound callus. The surface is viable with good granulation tissue. The medial thigh wounds are much smaller and are epithelializing. On the other hand, he had purulent drainage coming from the tunnel on his lateral leg. He does go back to see Dr. Randie Heinz next week and is planning to ask him why the wound tunnel was not completely opened at the time of his most recent operation. 10/19/2021: The plantar foot wound is markedly improved and has epithelial tissue coming through the surface. The medial thigh wounds are nearly closed with just a tiny open area. He did see Dr. Randie Heinz earlier this week and apparently they did discuss the possibility of opening the sinus tract further and enabling a wound VAC application. Apparently there are some limits as to what Dr.  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 I think if we could get the tract open to the level of the popliteal fossa, this would greatly aid in her ability to get this chart closed. That being said, however, today when I probed the tract with a Q-tip, I was not able to insert the entirety of the Q-tip as I have on previous occasions. The tunnel is shorter by about 4 cm. The surface is clean with good granulation tissue and no further episodes of purulent drainage. 10/30/2021: Last week, the patient underwent surgery and had the long tract in his leg opened. There was a rind that was debrided, according to the operative report. His medial thigh ulcers are closed. The plantar foot wound is clean with a good surface and some built up surrounding callus. 11/06/2021: The overall dimensions of the large wound on his  lateral leg remain about the same, but there is good granulation tissue present and the tunneling is [name], [name] ([id number]) [id number] Page 6 of 21 a little bit shorter. He has a new wound on his anterior tibial surface, in the same location where he had a similar lesion in the past. The plantar foot wound is clean with some buildup surrounding callus. Just toward the medial aspect of his foot, however, there is an area of darkening that once debrided, revealed another opening in the skin surface. 11/13/2021: The anterior tibial surface wound is closed. The plantar foot wound has some surrounding callus buildup. The area of darkening that I debrided last week and revealed an opening in the skin surface has closed again. The tunnel in the large wound on his lateral leg has come in by about 3 cm. There is healthy granulation tissue on the entire wound surface. 11/23/2021: The patient was out of town last week and did wet-to-dry dressings on his large wound. 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 Unfortunately, he picked open the wound on his left medial thigh. He says that it was itching and he just could not stop scratching it until it was open again. The wound on his plantar foot is smaller and has not accumulated a tremendous amount of callus. The lateral leg wound is shallower and the tunnel has also decreased in depth. There is just a little bit of slough accumulation on the surface. 11/30/2021: Another portion of his left medial thigh has been opened up. All of these wounds are fairly superficial with just a little bit of slough and eschar accumulation. The wound on his plantar foot is almost closed with just a bit of eschar and periwound callus accumulation. The lateral leg wound is nearly flush with the surrounding skin and the tunnel is markedly shallower. 12/07/2021: There is just 1 open area on  his left medial thigh. It is clean with just a little bit of perimeter eschar. The wound on his plantar foot continues to contract and just has some eschar and periwound callus accumulation. The lateral leg wound is closing at the more distal aspect and the tunnel is smaller. The surface is nearly flush with the surrounding skin and it has a good bed of granulation tissue. 12/14/2021: The thigh and foot wounds are closed. The lateral leg wound has closed over approximately half of its length. The tunnel continues to contract and the surface is now flush with the surrounding skin. The wound bed has robust granulation tissue. 12/22/2021: The  thigh and foot wounds have reopened. The foot wound has a lot of callus accumulation around and over it. The thigh wound is tiny with just a little bit of slough in the wound bed. The lateral leg wound continues to contract. His vascular surgeon took the wound VAC off earlier in the week and the patient has been doing wet-to-dry dressings. There is a little slough accumulation on the surface. The tunnel is about 3 cm in depth at this point. 12/28/2021: The thigh wound is closed again. The foot wound has some callus that subsequently has peeled back exposing just a small slit of a wound. The lateral leg wound Is down to about half the size that it originally was and the tunnel is down to about half a centimeter in depth. 01/04/2022: The thigh wound remains closed. The foot wound has heavy callus overlying the wound site. Once this was debrided, the wound was found to be closed. The lateral leg wound is smaller again this week and very superficial. No tunnel could be identified. 01/12/2022: The thigh and foot wounds both remain closed. The lateral leg wound is now nearly flush with the skin surface. There is good granulation tissue present with a light layer of slough. 01/19/2022: Due to the way his wrap was placed, the patient did not change the dressing on his thigh at all  and so the foam was saturated and his skin is macerated. There is a light layer of slough on the wound surface. The underlying granulation tissue is robust and healthy-appearing. He has heavy callus buildup at the site of his first metatarsal head wound which is still healed. 02/01/2022: He has been in silver alginate. When he removed the dressing from his thigh wound, however, some leg, superficially reopening a portion of the wound that had healed. In addition, underneath the callus at his left first metatarsal head, there appears to be a blister and the wound appears to be open again. 02/08/2022: The lateral leg wound has contracted substantially. There is eschar and a light layer of slough present. He says that it is starting to pull and is uncomfortable. On inspection, there is some puckering of the scar and the eschar is quite dry; this may account for his symptoms. On his first metatarsal head, the wound is much smaller with just some eschar on the surface. The callus has not reaccumulated. He reports that he had a blister come up on his medial thigh wound at the distal aspect. It popped and there is now an opening in his skin again. Looking back through his library of wound photos, there is what looks like a permanent suture just deep to this location and it may be trying to erode through. We have been using silver alginate on his wounds. 02/15/2022: The lateral leg wound is about half the size it was last week. It is clean with just a little perimeter eschar and light slough. The wound on his first metatarsal head is about the same with heavy callus overlying it. The medial thigh wound is closed again. He does have some skin changes on the top of his foot that looks potentially yeast related. 02/22/2022: The skin on the top of his foot improved with the use of a topical antifungal. The lateral leg wound continues to contract and is again smaller this week. There is a little bit of slough and  eschar on the surface. The first metatarsal head wound is a little bit smaller but has reaccumulated a thick  callus over the top. He decided to try to trim his toenail and ultimately took the entire nail off of his left great toe. 03/02/2022: His lateral leg wound continues to improve, as does the wound on his left great toe. Unfortunately, it appears that somehow his foot got wet and moisture seeped in through the opening causing his skin to lift. There is a large wound now overlying his first metatarsal on both the plantar, medial, and dorsal portion of his foot. There is necrotic tissue and slough present underneath the shaggy macerated skin. 03/08/2022: The lateral leg wound is smaller again today. There is just a light layer of slough and eschar on the surface. The great toe wound is smaller again today. The first metatarsal wound is a little bit smaller today and does not look nearly as necrotic and macerated. There is still slough and nonviable tissue present. 03/15/2022: The lateral leg wound is narrower and just has a little bit of light slough buildup. The first metatarsal wound still has a fair amount of moisture affecting the periwound skin. The great toe wound is healed. 03/22/2022: The lateral leg wound is now isolated to just at the level of his knee. There is some eschar and slough accumulation. The first metatarsal head wound has epithelialized tremendously and is about half the size that it was last week. He still has some maceration on the top of his foot and a fungal odor is present. 03/29/2022: T oday the patient's foot was macerated, suggesting that the cast got wet. The patient has also been picking at his dry skin and has enlarged the wound on his left lateral leg. In the time between having his cast removed and my evaluation, he had picked more dry skin and opened up additional wounds on his Achilles area and dorsal foot. The plantar first metatarsal head wound, however, is smaller  and clean with just macerated callus around the perimeter and light slough on the surface. The lateral leg wound measured a little bit larger but is also fairly clean with eschar and minimal slough. 04/02/2022: The patient had vascular studies done last Friday and so his cast was not applied. He is here today to have that done. 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 04/05/2022: Both wounds are smaller and quite clean. There is just a little biofilm on the lateral leg wound. 10/20; the patient has a wound on the left lateral surgical incision at the level of his lateral knee this looks clean and improved. He is using silver alginate. He also has an area on his left medial foot for which she is using Hydrofera Blue under a total contact cast both wounds are measuring smaller 04/20/2022: The plantar foot wound has contracted considerably and is very close to closing. The lateral leg wound was measured a little larger, but there was a tiny open area that was included in the measurements that was not included last week. He has some eschar around the perimeter but otherwise the wound looks clean. 04/27/2022: The lateral leg wound looks better this week. He says that midweek, he felt it was very dry and began applying hydrogel to the site. I think this was beneficial. The foot wound is nearly closed underneath a thick layer of dry skin and callus. [name], [name] ([id number]) [id number] Page 7 of 21 05/04/2022: The foot wound is healed. He has developed a new small ulcer on his anterior tibial surface about midway up his leg. It has a  little slough on the surface. The lateral leg wound still is fairly dry, but clean with just a little biofilm on the surface. 05/11/2022: The wound on his foot reopened on Wednesday. A large blister formed which then broke open revealing the fat layer underneath. The ulcer on his anterior tibial surface is a little bit larger this week.  The lateral leg wound has much better moisture balance this week. Fortunately, prior to his foot wound reopening, he did get the cast made for his orthotic. 05/15/2022: Already, the left medial foot wound has improved. The tissue is less macerated and the surface is clean. The ulcer on his anterior tibial surface continues to enlarge. This seems likely secondary to accumulated moisture. The lateral leg wound continues to have an improved moisture balance with the use of collagen. 05/25/2022: The medial foot wound continues to contract. It is now substantially smaller with just a little slough on the surface. The anterior tibial surface wound continues to enlarge further. Once again, this seems to be secondary to moisture. The lateral leg wound does not seem to be changing much in size, but the moisture balance is better. 06/01/2022: The anterior tibial wound is closed. The medial foot wound is down to just a very small, couple of millimeters, opening. The lateral leg wound has good moisture balance, but remains unchanged in size. 12/15; the patient's anterior tibial wound has reopened, however the area on his right first metatarsal head is closed. The major wound is actually on the superior part of his surgical wound in the left lateral thigh. Not a completely viable surface under illumination. This may at some point require a debridement I think he is currently using Prisma. As noted the left medial foot wound has closed 06/14/2022: The anterior tibial wound has closed. The lateral leg wound has a better surface but is basically unchanged in size. The left medial foot wound has reopened. It looks as though there was some callus accumulation and moisture got under the callus which caused the tissue to break down again. 06/21/2022: A new wound has opened up just distal to the previous anterior tibial wound. It is small but has hypertrophic granulation tissue present. The lateral leg wound is a little  bit narrower and has a layer of slough on the surface. The left medial foot wound is down to just a pinhole. His custom orthotics should be available next week. 06/28/2022: The wound on his first metatarsal head has healed. He has developed a new small wound on his medial lower leg, in an old scar site. The lateral leg wound continues to contract but continues to accumulate slough, as well. 07/03/2022: Despite wearing his custom orthopedic shoes, he managed to reopen the wound on his first metatarsal head. He says he thinks his foot got wet and then some skin lifted up and he peeled this away. Both of the lower leg wounds are smaller and have some dry eschar on the surface. The lateral leg wound is quite a bit narrower today. 07/12/2022: The medial lower leg wound is closed. The anterior lower leg wound has contracted considerably. The lateral upper leg wound is narrower with a layer of slough on the surface. The first metatarsal head wound is also smaller, but had copious drainage which saturated the foam border dressing and resulted in some periwound tissue maceration. Fortunately there was no breakdown at this site. 07/19/2022: The lower leg shows signs of significant maceration; I think he must be sweating excessively inside his cast.  There are several areas of skin breakdown present. The wound on his foot is smaller and that on his lateral leg is narrower and is shorter by about a centimeter. 07/26/2022: Last week we used a zinc Coflex wrap prior to applying his total contact cast and this has had the effect of keeping his skin from getting macerated this week. The anterior leg wound has epithelialized substantially. The lateral leg wound is significantly smaller with just a bit of slough on the surface. The first metatarsal head wound is also smaller this week. 08/02/2022: The anterior leg wound was closed on arrival, but while he was sitting in the room, he picked it open again. The lateral leg wound is  smaller with just a little slough on the surface and the first metatarsal head wound has contracted further, as well. 08/09/2022: The first metatarsal head wound is covered with callus. Underneath the callus, it is nearly completely closed. The lateral leg wound is smaller again this week. The anterior leg wound looks better, but he has such heavy buildup of old skin, that moisture is getting underneath this, becoming trapped, and causing the underlying skin to get macerated and open up. 08/16/2022: The first metatarsal head wound is closed. The lateral leg wound continues to contract and is quite a bit smaller again this week. There is just a small, superficial opening remaining on his anterior tibial surface. 08/23/2022: The first metatarsal head wound has, by some miracle, remained closed. The lateral leg wound is substantially smaller with multiple areas of epithelialization. The anterior tibial surface wound is also quite a bit smaller and very clean. 08/30/2022: Unfortunately, his first metatarsal head wound opened up again. It happened in the same fashion as it has on prior occasions. Moisture got under dried skin/callus and created a wound when he removed his sock, taking the skin with it. The anterior tibial surface has a thick shell of hyperkeratotic skin. This has been contributing to ongoing repeat wounding events as moisture gets underneath this and causes tissue breakdown. 3/15; patient presents for follow-up. His anterior left leg wound has healed. He still has the wound to the left lateral aspect and left first met head. We have been using silver alginate and endoform to these areas under [employer]. He has no issues or complaints today. He has been taking Augmentin and reports improvement to his symptoms to the left first met head. 09/13/2022: He has accumulated more thick dry skin in sheets on his lower leg. The lateral leg wound is about the same size and the left first metatarsal  head wound is a little bit smaller. There is slough on both surfaces. There is callus buildup around the foot wound. 09/20/2022: The lateral leg wound is a little bit narrower and the left first metatarsal head wound also seems to have contracted slightly. There is slough on both surfaces. He has a little skin breakdown on his anterior tibial surface. 09/27/2022: The lateral leg wound continues to contract and is quite clean. The first metatarsal head wound is also smaller. There is some perimeter callus and slough accumulation on the foot. The anterior tibial surface is closed. 10/04/2022: Both of his wounds are smaller today, particularly the first metatarsal head wound. 10/18/2022: He missed his appointment last week and ended up cutting off his wrap on Saturday. The anterior tibial wound reopened. It is fairly superficial with a little bit of slough on the surface. His lateral leg wound is smaller with some slough and eschar buildup.  The first metatarsal head wound is also smaller with some callus and slough accumulation. 10/25/2022: All wounds are smaller. There is slough and eschar on the lateral leg and slough and callus on the plantar foot wound. The anterior tibial wound is clean and flush with the surrounding skin. No debris accumulation here. 11/01/2022: The wounds are all smaller again this week. There is slough on the lateral leg and some minimal slough and eschar on the anterior tibial wound. There is callus accumulation on the first metatarsal head site, along with slough. There is also a yeasty odor coming from the foot. 11/08/2022: The lateral leg wound is smaller except where he picked some skin while waiting to be seen. There is a little bit of slough on the surface. The anterior [name], LAMMERT ([id number]) [id number] Page 8 of 21 tibial wound is closed. The first metatarsal head site has gotten macerated once again and has a lot of spongy wet tissue and callus  around it. No yeast odor today. 11/15/2022: The lateral leg wound continues to contract. There is now a band of epithelium dividing it into 2 areas. Minimal slough accumulation. He managed to pick open a new wound on his medial lower leg. The first metatarsal head site is smaller with some callus accumulation but no tissue maceration. 11/22/2022: The lateral leg wound is smaller again by about a third today. There is a little bit of slough on the surface with some periwound eschar. The new wound that he picked open last week has healed but he picked open 3 new small wounds on his anterior tibial surface, once again due to his picking at his skin. The first metatarsal head wound looks about the same. There has been some callus accumulation and there is more edema present, as he was not put in a compression wrap last week. 11/29/2022: The lateral leg wound is smaller again today. There is a little periwound eschar and some slough present. The wounds on his anterior tibial surface are all closed except for 1 that has a little bit of slough on the open portion with eschar covering it. The first metatarsal head wound measured a little bit smaller today, but mostly looks about the same. Edema control is better. 12/06/2022: The anterior tibial wound is closed. The lateral leg wound is smaller with some slough and eschar accumulation. The plantar foot ulcer is about the same size, but the tissue does show some evidence of the fact that he was on his feet quite a bit more this past week; there was a death in his family. There has been more callus accumulation and the tissues are little bit more purpleish. 12/13/2022: He picked the skin on his anterior tibia and reopened a wound there while he was waiting to be seen in clinic. The lateral leg wound is much smaller with a little bit of slough and eschar. The plantar foot ulcer is smaller today with callus and slough buildup, but without the pressure induced tissue  injury that was seen last week. Electronic Signature(s) Signed: 12/13/2022 10:38:42 AM By: [name] Guess MD FACS Entered By: [name] on 12/13/2022 10:38:42 -------------------------------------------------------------------------------- Physical Exam Details Patient Name: Date of Service: [name]. 12/13/2022 10:00 A M Medical Record Number: [id number] Patient Account Number: [id number] Date of Birth/Sex: Treating RN: 06-05-1951 (72 y.o. M) Primary Care Provider: [name] Other Clinician: Referring Provider: Treating Provider/Extender: [name] in Treatment: 26 Constitutional . . . . no acute distress. Respiratory Normal  work of breathing on room air. Notes 12/13/2022: He picked the skin on his anterior tibia and reopened a wound there while he was waiting to be seen in clinic. The lateral leg wound is much smaller with a little bit of slough and eschar. The plantar foot ulcer is smaller today with callus and slough buildup, but without the pressure induced tissue injury that was seen last week. Electronic Signature(s) Signed: 12/13/2022 10:39:08 AM By: [name] Guess MD FACS Entered By: [name] on 12/13/2022 10:39:07 -------------------------------------------------------------------------------- Physician Orders Details Patient Name: Date of Service: [name]. 12/13/2022 10:00 A M Medical Record Number: [id number] Patient Account Number: [id number] Date of Birth/Sex: Treating RN: 10-13-1950 (72 y.o. [name] Primary Care Provider: [name] Other Clinician: Referring Provider: Treating Provider/Extender: [name] in Treatment: 42 Verbal / Phone Orders: No [name] ([id number]) [id number] Page 9 of 21 Diagnosis Coding ICD-10 Coding Code Description L97.528 Non-pressure chronic ulcer of other part of left foot with other specified  severity L97.828 Non-pressure chronic ulcer of other part of left lower leg with other specified severity I87.322 Chronic venous hypertension (idiopathic) with inflammation of left lower extremity E11.51 Type 2 diabetes mellitus with diabetic peripheral angiopathy without gangrene I89.0 Lymphedema, not elsewhere classified L85.9 Epidermal thickening, unspecified Follow-up Appointments ppointment in 1 week. - Dr Lady Gary - Room 2 Return A Anesthetic (In clinic) Topical Lidocaine 4% applied to wound bed Bathing/ Shower/ Hygiene May shower with protection but do not get wound dressing(s) wet. Protect dressing(s) with water repellant cover (for example, large plastic bag) or a cast cover and may then take shower. Edema Control - Lymphedema / SCD / Other Avoid standing for long periods of time. Patient to wear own compression stockings every day. - both legs daily Moisturize legs daily. Compression stocking or Garment 20-30 mm/Hg pressure to: Off-Loading Wound #18R Left,Plantar Metatarsal head first Open toe surgical shoe to: - Front off loader Left ft Other: - minimal weight bearing left foot Wound Treatment Wound #18R - Metatarsal head first Wound Laterality: Plantar, Left Cleanser: Soap and Water 1 x Per Week/30 Days Discharge Instructions: May shower and wash wound with dial antibacterial soap and water prior to dressing change. Cleanser: Wound Cleanser 1 x Per Week/30 Days Discharge Instructions: Cleanse the wound with wound cleanser prior to applying a clean dressing using gauze sponges, not tissue or cotton balls. Peri-Wound Care: Ketoconazole Cream 2% 1 x Per Week/30 Days Discharge Instructions: Apply Ketoconazole as directed Prim Dressing: Maxorb Extra Ag+ Alginate Dressing, 2x2 (in/in) 1 x Per Week/30 Days ary Discharge Instructions: Apply to wound bed as instructed Secondary Dressing: Optifoam Non-Adhesive Dressing, 4x4 in (Generic) 1 x Per Week/30 Days Discharge Instructions:  Apply over primary dressing as directed. Secondary Dressing: Woven Gauze Sponges 2x2 in (Generic) 1 x Per Week/30 Days Discharge Instructions: Apply over primary dressing as directed. Secondary Dressing: Zetuvit Absorbent Pad, 4x4 (in/in) 1 x Per Week/30 Days Compression Wrap: Urgo K2, (equivalent to a 4 layer) two layer compression system, regular 1 x Per Week/30 Days Discharge Instructions: Apply Urgo K2 as directed (alternative to 4 layer compression). Wound #22 - Lower Leg Wound Laterality: Left, Lateral, Proximal Cleanser: Soap and Water 3 x Per Week/30 Days Discharge Instructions: May shower and wash wound with dial antibacterial soap and water prior to dressing change. Cleanser: Wound Cleanser 3 x Per Week/30 Days Discharge Instructions: Cleanse the wound with wound cleanser prior to applying a clean dressing using gauze sponges, not tissue  or cotton balls. Peri-Wound Care: Skin Prep 3 x Per Week/30 Days Discharge Instructions: Use skin prep as directed Peri-Wound Care: Sween Lotion (Moisturizing lotion) 3 x Per Week/30 Days Discharge Instructions: Apply moisturizing lotion to the leg Topical: Skintegrity Hydrogel 4 (oz) 3 x Per Week/30 Days Discharge Instructions: Apply hydrogel as directed Prim Dressing: Endoform 2x2 in 3 x Per Week/30 Days ary Discharge Instructions: Moisten with Hydrogel or saline [name], [name] ([id number]) [id number] Page 10 of 21 Secondary Dressing: Zetuvit Plus 4x8 in 3 x Per Week/30 Days Discharge Instructions: Apply over primary dressing as directed. Secured With: Elastic Bandage 4 inch (ACE bandage) 3 x Per Week/30 Days Discharge Instructions: Secure with ACE bandage as directed. Electronic Signature(s) Signed: 12/13/2022 11:31:33 AM By: [name] Guess MD FACS Entered By: [name] on 12/13/2022 10:39:31 -------------------------------------------------------------------------------- Problem List Details Patient  Name: Date of Service: [name]. 12/13/2022 10:00 A M Medical Record Number: [id number] Patient Account Number: [id number] Date of Birth/Sex: Treating RN: 08-01-1950 (72 y.o. M) Primary Care Provider: [name] Other Clinician: Referring Provider: Treating Provider/Extender: [name] in Treatment: 87 Active Problems ICD-10 Encounter Code Description Active Date MDM Diagnosis L97.528 Non-pressure chronic ulcer of other part of left foot with other specified 08/26/2022 No Yes severity L97.828 Non-pressure chronic ulcer of other part of left lower leg with other specified 04/12/2021 No Yes severity I87.322 Chronic venous hypertension (idiopathic) with inflammation of left lower 04/12/2021 No Yes extremity E11.51 Type 2 diabetes mellitus with diabetic peripheral angiopathy without gangrene 04/12/2021 No Yes I89.0 Lymphedema, not elsewhere classified 04/12/2021 No Yes L85.9 Epidermal thickening, unspecified 08/30/2022 No Yes Inactive Problems ICD-10 Code Description Active Date Inactive Date E11.621 Type 2 diabetes mellitus with foot ulcer 04/12/2021 04/12/2021 L97.828 Non-pressure chronic ulcer of other part of left lower leg with other specified severity 06/08/2022 06/08/2022 E11.42 Type 2 diabetes mellitus with diabetic polyneuropathy 04/12/2021 04/12/2021 [name] ([id number]) [id number] Page 11 of 21 L02.416 Cutaneous abscess of left lower limb 06/13/2021 06/13/2021 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 07/20/2021 07/20/2021 Resolved Problems Electronic Signature(s) Signed: 12/13/2022 10:37:02 AM By: [name] Guess MD FACS Entered By: [name] on 12/13/2022 10:37:02 -------------------------------------------------------------------------------- Progress Note Details Patient Name: Date of Service: [name]. 12/13/2022 10:00 A M Medical Record Number:  [id number] Patient Account Number: [id number] Date of Birth/Sex: Treating RN: Feb 26, 1951 (72 y.o. M) Primary Care Provider: [name] Other Clinician: Referring Provider: Treating Provider/Extender: [name] in Treatment: 76 Subjective Chief Complaint Information obtained from Patient Left leg and foot ulcers 04/12/2021; patient is here for wounds on his left lower leg and left plantar foot over the first metatarsal head History of Present Illness (HPI) 10/11/17; Mr. [name] is a 72 year old man who tells me that in 2015 he slipped down the latter traumatizing his left leg. He developed a wound in the same spot the area that we are currently looking at. He states this closed over for the most part although he always felt it was somewhat unstable. In 2016 he hit the same area with the door of his car had this reopened. He tells me that this is never really closed although sometimes an inflow it remains open on a constant basis. He has not been using any specific dressing to this except for topical antibiotics the nature of which were not really sure. His primary doctor did send him to see Dr. Jacinto Halim of interventional cardiology. He underwent an angiogram on  08/06/17 and he underwent a PTA and directional atherectomy of the lesser distal SFA and popliteal arteries which resulted in brisk improvement in blood flow. It was noted that he had 2 vessel runoff through the anterior tibial and peroneal. He is also been to see vascular and interventional radiologist. He was not felt to have any significant superficial venous insufficiency. Presumably is not a candidate for any ablation. It was suggested he come here for wound care. The patient is a type II diabetic on insulin. He also has a history of venous insufficiency. ABIs on the left were noncompressible in our clinic 10/21/17; patient we admitted to the clinic last week. He has a fairly large chronic ulcer on the left lateral  calf in the setting of chronic venous insufficiency. We put Iodosorb on him after an aggressive debridement and 3 layer compression. He complained of pain in his ankle and itching with is skin in fact he scratched the area on the medial calf superiorly at the rim of our wraps and he has 2 small open areas in that location today which are new. I changed his primary dressing today to silver collagen. As noted he is already had revascularization and does not have any significant superficial venous insufficiency that would be amenable to ablation 10/28/17; patient admitted to the clinic 2 weeks ago. He has a smaller Wound. Scratch injury from last week revealed. There is large wound over the tibial area. This is smaller. Granulation looks healthy. No need for debridement. 11/04/17; the wound on the left lateral calf looks better. Improved dimensions. Surface of this looks better. We've been maintaining him and Kerlix Coban wraps. He finds this much more comfortable. Silver collagen dressing 11/11/17; left lateral Wound continues to look healthy be making progress. Using a #5 curet I removed removed nonviable skin from the surface of the wound and then necrotic debris from the wound surface. Surface of the wound continues to look healthy. He also has an open area on the left great toenail bed. We've been using topical antibiotics. 11/19/17; left anterior lateral wound continues to look healthy but it's not closed. He also had a small wound above this on the left leg Initially traumatic wounds in the setting of significant chronic venous insufficiency and stasis dermatitis 11/25/17; left anterior wounds superiorly is closed still a small wound inferiorly. 12/02/17; left anterior tibial area. Arrives today with adherent callus. Post debridement clearly not completely closed. Hydrofera Blue under 3 layer compression. 12/09/17; left anterior tibia. Circumferential eschar however the wound bed looks stable to  improved. We've been using Hydrofera Blue under 3 layer compression 12/17/17; left anterior tibia. Apparently this was felt to be closed however when the wrap was taken off there is a skin tear to reopen wounds in the same area we've been using Hydrofera Blue under 3 layer compression 12/23/17 left anterior tibia. Not close to close this week apparently the [facility] was stuck to this again. Still circumferential eschar requiring debridement. I put a contact layer on this this time under the Hydrofera Blue 12/31/17; left anterior tibia. Wound is better slight amount of hyper-granulation. Using Hydrofera Blue over Adaptic. 01/07/18; left anterior tibia. The wound had some surface eschar however after this was removed he has no open wound.he was already revascularized by Dr. Jacinto Halim when he came to our clinic with atherectomy of the left SFA and popliteal artery. He was also sent to interventional radiology for venous reflux studies. He was not felt to have significant reflux  but certainly has chronic venous changes of his skin with hemosiderin deposition around this area. He will definitely need to lubricate his skin and wear compression stocking and I've talked to him about this. [name], [name] ([id number]) [id number] Page 12 of 21 05/26/2018 This is a now 72 year old man we cared for with traumatic wounds on his left anterior lower extremity. He had been previously revascularized during that admission by Dr. Jacinto Halim. Apparently in follow-up Dr. Jacinto Halim noted that he had deterioration in his arterial status. He underwent a stent placement in the distal left SFA on 04/22/2018. 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 He went back to the angiography suite on 04/30/2018 he underwent PTA and balloon angioplasty of the occluded left mid anterior tibial artery, thrombotic occlusion went from 100 to 0% which reconstitutes the posterior tibial artery. He had  thrombectomy and aspiration of the peroneal artery. The stent placed in the distal SFA left SFA was still occluded. 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 He has been recuperating at [facility] in [facility] after the most recent hospitalization. He is going home tomorrow. He tells me that 2 weeks ago he traumatized the tip of his left fifth toe. He came in urgently for our review of this. This was a history of before I noted that Dr. Jacinto Halim had already noted dry gangrenous changes of the left fifth toe 06/09/2018; 2-week follow-up. I did contact Dr. Jacinto Halim after his last appointment and he apparently saw 1 of Dr. Verl Dicker colleagues the next day. He does not follow-up with Dr. Jacinto Halim himself until Thursday of this week. 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 Nevertheless there is no evidence of infection no drainage and no pain. He had a new area that this week when we were signing him in today on the left anterior mid tibia area, this is in close proximity to the previous wound we have dealt with in this clinic. 06/23/2018; 2-week follow-up. I did not receive a recent note from Dr. Jacinto Halim to review today. Our office is trying to obtain this. He is apparently not planning to do further vascular interventions and wondered about compression to try and help with the patient's chronic venous insufficiency. However we are also concerned about the arterial flow. He arrives in clinic today with a new area on the left third toe. The areas on the calf/anterior tibia are close to closing. The left fifth toe is still mummified using Betadine. -In reviewing things with the patient he has what sounds like claudication with mild to  moderate amount of activity. 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 I prescribed Levaquin over the phone while we attempted to arrange a plan of care. However the patient called yesterday to report he had low-grade fever and he came in today acutely. There is been a marked deterioration in the left third toe with spreading cellulitis up into the dorsal left foot. He was referred to the emergency room. Readmission: 06/29/2020 patient presents today for reevaluation here in our clinic he was previously treated by Dr. Leanord Hawking at the latter part of 2019 in 2 the beginning of 2020. Subsequently we have not  seen him since that time in the interim he did have evaluation with vein and vascular specialist specifically Dr. Bo Mcclintock who did perform quite extensive work for a left femoral to anterior tibial artery bypass. With that being said in the interim the patient has developed significant lymphedema and has wounds that he tells me have really never healed in regard to the incision site on the left leg. He also has multiple wounds on the feet for various reasons some of which is that he tends to pick at his feet. Fortunately there is no signs of active infection systemically at this time he does have some wounds that are little bit deeper but most are fairly superficial he seems to have good blood flow and overall everything appears to be healthy I see no bone exposed and no obvious signs of osteomyelitis. I do not know that he necessarily needs a x-ray at this point although that something we could consider depending on how things progress. The patient does have a history of lymphedema, diabetes, this is type II, chronic kidney disease stage III, hypertension, and history of peripheral vascular disease. 07/05/2020; patient admitted last week. Is a patient I remember from 2019 he had a spreading infection involving the left foot and we sent him to the hospital. 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 He subsequently had a left femoral to anterior tibial bypass by Dr.Cain vein and vascular. He also has severe lymphedema with chronic skin changes related to that on the left leg. The most problematic area that was new today was on the left medial great toe. This was apparently a small area last week there was purulent drainage which our intake nurse cultured. Also areas on the left medial foot and heel left lateral foot. He has 2 areas on the left medial calf left lateral calf in the setting of the severe lymphedema. 07/13/2020 on evaluation today patient appears to be doing better in my opinion compared to his last visit. The good news is there is no signs of active infection systemically and locally I do not see any signs of infection either. He did have an x-ray which was negative that is great news he had a culture which showed MRSA but at the same time he is been on the doxycycline which has helped. I do think we may want to extend this for 7 additional days 1/25; patient admitted to the clinic a few weeks ago. 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 We have been putting him under compression his edema control is a lot better but he is severe verricused skin on the left leg. He is really done quite well he still has an open area on the left medial calf and the left medial first metatarsal head. We have been using silver collagen on the leg silver alginate on the foot 07/27/2020 upon evaluation today patient appears to be doing decently well in regard to his wounds. He still has a lot of dry skin on the left leg. Some of this is starting to peel back and I think he may be able to have them out by removing some that today. Fortunately there is no signs of active infection at this time on the left leg although on the right leg he does appear to have swelling and erythema as well as  some mild warmth to touch. This does have been concerned  about the possibility of cellulitis although within the differential diagnosis I do think that potentially a DVT has to be at least considered. We need to rule that out before proceeding would just call in the cellulitis. Especially since he is having pain in the posterior aspect of his calf muscle. 2/8; the patient had seen sparingly. He has severe skin changes of chronic lymphedema in the left leg thickened hyperkeratotic verrucous skin. He has an open wound on the medial part of the left first met head left mid tibia. He also has a rim of nonepithelialized skin in the anterior mid tibia. He brought in the AmLactin lotion that was been prescribed although I am not sure under compression and its utility. There concern about cellulitis on the right lower leg the last time he was here. He was put on on antibiotics. His DVT rule out was negative. The right leg looks fine he is using his stocking on this area 08/10/2020 upon evaluation today patient appears to be doing well with regard to his leg currently. He has been tolerating the dressing changes without complication. Fortunately there is no signs of active infection which is great news. Overall very pleased with where things stand. 2/22; the patient still has an area on the medial part of the left first met his head. This looks better than when I last saw this earlier this month he has a rim of epithelialization but still some surface debris. Mostly everything on the left leg is healed. There is still a vulnerable in the left mid tibia area. 08/30/2020 upon evaluation today patient appears to be doing much better in regard to his wounds on his foot. 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 Nonetheless I think we will address that today I Minna send in a  prescription for him in that regard. Overall though there does not appear to be any signs of significant worsening. 09/07/2020 on evaluation today patient's wounds over his left foot appear to be doing excellent. I do not see any signs of infection there is some callus buildup this can require debridement for certain but overall I feel like he is managing quite nicely. He still using the AmLactin cream which has been beneficial for him as well. 3/22; left foot wound is closed. There is no open area here. He is using ammonium lactate lotion to the lower extremities to help exfoliate dry cracked skin. He has compression stockings from [employer] therapy in [place]. The wound on the medial part of his left first met head is healed today. READMISSION 04/12/2021 Mr. Legrand is a patient we know fairly well he had a prolonged stay in clinic in 2019 with wounds on his left lateral and left anterior lower extremity in the setting of chronic venous insufficiency. More recently he was here earlier this year with predominantly an area on his left foot first metatarsal head plantar and he says the plantar foot broke down on its not long after we discharged him but he did not come back here. The last few months areas of broken down on his left anterior and again the left lateral lower extremity. The leg itself is very swollen chronically enlarged a lot of hyperkeratotic dry Berry Q skin in the left lower leg. His edema extends well into the thigh. He was seen by Dr. Randie Heinz. He had ABIs on 03/02/2021  showing an ABI on the right of 1 with a TBI of 0.72 his ABI in the left at 1.09 TBI of 0.99. Monophasic and biphasic waveforms on the right. On the left monophasic waveforms were noted he went on to have an angiogram on 03/27/2021 this showed the aortic aortic KAED, FEINBERG ([id number]) [id number] Page 13 of 21 and iliac segments were free of flow-limiting stenosis the left common femoral vein to  evaluate the left femoral to anterior tibial artery bypass was unobstructed the bypass was patent without any areas of stenosis. We discharged the patient in bilateral juxta lite stockings but very clearly that was not sufficient to control the swelling and maintain skin integrity. He is clearly going to need compression pumps. 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 This is fortunate because he is on his feet for long periods of time. 10/27; patient comes in with our intake nurse reporting copious amount of green drainage from the left anterior mid tibia the left dorsal foot and to a lesser extent the left medial mid tibia. We left the compression wrap on all week for the amount of edema in his left leg is quite a bit better. We use silver alginate as the primary dressing 11/3; edema control is good. Left anterior lower leg left medial lower leg and the plantar first metatarsal head. The left anterior lower leg required debridement. Deep tissue culture I did of this wound showed MRSA I put him on 10 days of doxycycline which she will start today. We have him in compression wraps. He has a security card and [employer] however he is retiring on November 15. We will need to then get him into a better offloading boot for the left foot perhaps a total contact cast 11/10; edema control is quite good. Left anterior and left medial lower leg wounds in the setting of chronic venous insufficiency and lymphedema. He also has a substantial area over the left plantar first metatarsal head. 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 He has significant hypergranulation on the left plantar foot wound. The patient is a diabetic but he does not have significant PAD 11/17; edema control is quite good. Left anterior and left medial lower leg wounds look better. The really  concerning area remains the area on the left plantar first metatarsal head. He has a rim of epithelialization. 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 Starting today with a forefoot off loader but . possibly a total contact cast. 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 Arrives with a very significant odor from I think all of the wounds on the left leg and the left foot. Because of this I did not put a total contact cast on him today but will could still consider this. His wife was having cataract surgery which is the reason he missed the appointment 12/6. I saw this man 5 days ago with a swelling below the popliteal fossa. I thought he actually might have a Baker's cyst however the DVT rule out study that we could arrange right  away was negative the technician told me this was not a ruptured Baker's cyst. We attempted to get this aspirated by under ultrasound guidance in interventional radiology however all they did was an ultrasound however it shows an extensive fluid collection 62 x 8 x 9.4 in the left thigh and left calf. The patient states he thinks this started 8 days ago or so but he really is not complaining of any pain, fever or systemic symptoms. He has not ha 12/20; after some difficulty I managed to get the patient into see Dr. Randie Heinz. Eventually he was taken into the hospital and had a drain put in the fluid collection below his left knee posteriorly extending into the posterior thigh. He still has the drain in place. Culture of this showed moderate staff aureus few Morganella and few Klebsiella he is now on doxycycline and ciprofloxacin as suggested by infectious disease he is on this for a month. The drain will remain in place until it stops draining 12/29; he  comes in today with the 1 wound on his left leg and the area on the left plantar first met head significantly smaller. Both look healthy. He still has the drain in the left leg. He says he has to change this daily. Follows up with Dr. Randie Heinz on January 11. 06/29/2021; the wounds that I am following on the left leg and left first met head continued to be quite healthy. However the area where his inferior drain is in place had copious amounts of drainage which was green in color. The wound here is larger. Follows up with Dr. Pascal Lux of vein and vascular his surgeon next week as well as infectious disease. He remains on ciprofloxacin and doxycycline. He is not complaining of excessive pain in either one of the drain areas 1/12; the patient saw vascular surgery and infectious disease. Vascular surgery has left the drain in place as there was still some notable drainage still see him back in 2 weeks. Dr. Dorthula Perfect stop the doxycycline and ciprofloxacin and I do not believe he follows up with them at this point. Culture I did last week showed both doxycycline resistant MRSA and Pseudomonas not sensitive to ciprofloxacin although only in rare titers 1/19; the patient's wound on the left anterior lower leg is just about healed. We have continued healing of the area that was medially on the left leg. Left first plantar metatarsal head continues to get smaller. The major problem here is his 2 drain sites 1 on the left upper calf and lateral thigh. There is purulent drainage still from the left lateral thigh. I gave him antibiotics last week but we still have recultured. He has the drain in the area I think this is eventually going to have to come out. I suspect there will be a connecting wound to heal here perhaps with improved VAc 1/26; the patient had his drain removed by vein and vascular on 1/25/. This was a large pocket of fluid in his left thigh that seem to tunnel into his left upper calf. 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 His mention his Penrose drain was removed today. He now has a tunneling wound on his left calf and left thigh. Both of these probe widely towards each other although I cannot really prove that they connect. Both wounds on his lower leg anteriorly are closed and his area over the first metatarsal head on his right foot continues to improve. We are using [facility]  here. He also saw infectious disease culture of the abscess they noted was polymicrobial with MRSA, Morganella and Klebsiella he was treated with doxycycline and ciprofloxacin for 4 weeks ending on 07/03/2021. They did not recommend any further antibiotics. Notable that while he still had the Penrose drain in place last week he had purulent drainage coming out of the inferior IandD site this grew [facility], MRSA and Pseudomonas but there does not appear to be any active infection in this area today with the drain out and he is not systemically unwell 2/2; with regards to the drain sites the superior one on the thigh actually is closed down the one on the upper left lateral calf measures about 8 and half centimeters which is an improvement seems to be less prominent although still with a lot of drainage. The only remaining wound is over the first metatarsal head on the left foot and this looks to be continuing to improve with Hydrofera Blue. 2/9; the area on his plantar left foot continues to contract. Callus around the wound edge. The drain sites specifically have not come down in depth. We put the wound VAC on Monday he changed the canister late last night our intake nurse reported a pocket of fluid perhaps caused by our compression wraps 2/16; continued improvement in left foot plantar wound. drainage site in the calf is not improved in terms of depth (wound vac) 2/23; continued improvement in the left foot wound over the first metatarsal head. With regards to the drain sites the area on his thigh  laterally is healed however the open area on his calf is small in terms of circumference by still probes in by about 15 cm. Within using the wound VAC. Hydrofera Blue on his foot 08/24/2021: The left first metatarsal head wound continues to improve. The wound bed is healthy with just some surrounding callus. Unfortunately the open drain site on his calf remains open and tunnels at least 15 cm (the extent of a Q-tip). This is despite several weeks of wound VAC treatment. Based on reading back through the notes, there has been really no significant change in the depth of the wound, although the orifice is smaller and the more cranial wound on his thigh has closed. I suspect the tunnel tracks nearly all the way to this location. 08/31/2021: Continued improvement in the left first metatarsal head wound. There has been absolutely no improvement to the long tunnel from his open drain site on his calf. We have tried to get him into see vascular surgery sooner to consider the possibility of simply filleting the tract open and allowing it to heal from the bottom up, likely with a wound VAC. They have not yet scheduled a sooner appointment than his current mid April 09/14/2021: He was seen by vascular surgery and they took him to the operating room last week. They opened a portion of the tunnel, but did not extend the entire length of the known open subcutaneous tract. I read Dr. Darcella Cheshire operative note and it is not clear from that documentation why only a portion of the tract FERLIN, PETRENKO ([id number]) [id number] Page 14 of 21 was opened. The heaped up granulation tissue was curetted and removed from at least some portion of the tract. They did place a wound VAC and applied an Unna boot to the leg. The ulcer on his left first metatarsal head is smaller today. The bed looks good and there is just a small amount of surrounding callus. 09/21/2021:  The ulcer on his left first metatarsal head  looks to be stalled. There is some callus surrounding the wound but the wound bed itself does not appear particularly dynamic. The tunnel tract on his lateral left leg seems to be roughly the same length or perhaps slightly smaller but the wound bed appears healthy with good granulation tissue. He opened up a new wound on his medial thigh and the site of a prior surgical incision. He says that he did this unconsciously in his sleep by scratching. 09/28/2021: Unfortunately, the ulcer on his left first metatarsal head has extended underneath the callus toward the dorsum of his foot. The medial thigh wounds are roughly the same. The tunnel on his lateral left leg continues to be problematic; it is longer than we are able to actually probe with a Q-tip. I am still not certain as to why Dr. Randie Heinz did not open this up entirely when he took the patient to the operating room. We will likely be back in the same situation with just a small superficial opening in a long unhealed tract, as the open portion is granulating in nicely. 10/02/2021: The patient was initially scheduled for a nurse visit, but we are also applying a total contact cast today. The plantar foot wound looks clean without significant accumulated callus. We have been applying Prisma silver collagen to the site. 10/05/2021: The patient is here for his first total contact cast change. We have tried using gauze packing strips in the tunnel on his lateral leg wound, but this does not seem to be working any better than the white VAC foam. The foot ulcer looks about the same with minimal periwound callus. Medial thigh wound is clean with just some overlying eschar. 10/12/2021: The plantar foot wound is stable without any significant accumulation of periwound callus. The surface is viable with good granulation tissue. The medial thigh wounds are much smaller and are epithelializing. On the other hand, he had purulent drainage coming from the tunnel on his  lateral leg. He does go back to see Dr. Randie Heinz next week and is planning to ask him why the wound tunnel was not completely opened at the time of his most recent operation. 10/19/2021: The plantar foot wound is markedly improved and has epithelial tissue coming through the surface. The medial thigh wounds are nearly closed with just a tiny open area. He did see Dr. Randie Heinz earlier this week and apparently they did discuss the possibility of opening the sinus tract further and enabling a wound VAC application. Apparently there are some limits as to what Dr. 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 I think if we could get the tract open to the level of the popliteal fossa, this would greatly aid in her ability to get this chart closed. That being said, however, today when I probed the tract with a Q-tip, I was not able to insert the entirety of the Q-tip as I have on previous occasions. The tunnel is shorter by about 4 cm. The surface is clean with good granulation tissue and no further episodes of purulent drainage. 10/30/2021: Last week, the patient underwent surgery and had the long tract in his leg opened. There was a rind that was debrided, according to the operative report. His medial thigh ulcers are closed. The plantar foot wound is clean with a good surface and some built up surrounding callus. 11/06/2021: The overall dimensions of the large wound on his lateral leg remain about  the same, but there is good granulation tissue present and the tunneling is a little bit shorter. He has a new wound on his anterior tibial surface, in the same location where he had a similar lesion in the past. The plantar foot wound is clean with some buildup surrounding callus. Just toward the medial aspect of his foot, however, there is an area of darkening that once debrided, revealed another opening in the skin surface. 11/13/2021: The anterior tibial surface wound is closed. The  plantar foot wound has some surrounding callus buildup. The area of darkening that I debrided last week and revealed an opening in the skin surface has closed again. The tunnel in the large wound on his lateral leg has come in by about 3 cm. There is healthy granulation tissue on the entire wound surface. 11/23/2021: The patient was out of town last week and did wet-to-dry dressings on his large wound. 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 Unfortunately, he picked open the wound on his left medial thigh. He says that it was itching and he just could not stop scratching it until it was open again. The wound on his plantar foot is smaller and has not accumulated a tremendous amount of callus. The lateral leg wound is shallower and the tunnel has also decreased in depth. There is just a little bit of slough accumulation on the surface. 11/30/2021: Another portion of his left medial thigh has been opened up. All of these wounds are fairly superficial with just a little bit of slough and eschar accumulation. The wound on his plantar foot is almost closed with just a bit of eschar and periwound callus accumulation. The lateral leg wound is nearly flush with the surrounding skin and the tunnel is markedly shallower. 12/07/2021: There is just 1 open area on his left medial thigh. It is clean with just a little bit of perimeter eschar. The wound on his plantar foot continues to contract and just has some eschar and periwound callus accumulation. The lateral leg wound is closing at the more distal aspect and the tunnel is smaller. The surface is nearly flush with the surrounding skin and it has a good bed of granulation tissue. 12/14/2021: The thigh and foot wounds are closed. The lateral leg wound has closed over approximately half of its length. The tunnel continues to contract and the surface is now flush with the surrounding skin. The wound  bed has robust granulation tissue. 12/22/2021: The thigh and foot wounds have reopened. The foot wound has a lot of callus accumulation around and over it. The thigh wound is tiny with just a little bit of slough in the wound bed. The lateral leg wound continues to contract. His vascular surgeon took the wound VAC off earlier in the week and the patient has been doing wet-to-dry dressings. There is a little slough accumulation on the surface. The tunnel is about 3 cm in depth at this point. 12/28/2021: The thigh wound is closed again. The foot wound has some callus that subsequently has peeled back exposing just a small slit of a wound. The lateral leg wound Is down to about half the size that it originally was and the tunnel is down to about half a centimeter in depth. 01/04/2022: The thigh wound remains closed. The foot wound has heavy callus overlying the wound site. Once this was debrided, the wound was found to be closed. The lateral leg  wound is smaller again this week and very superficial. No tunnel could be identified. 01/12/2022: The thigh and foot wounds both remain closed. The lateral leg wound is now nearly flush with the skin surface. There is good granulation tissue present with a light layer of slough. 01/19/2022: Due to the way his wrap was placed, the patient did not change the dressing on his thigh at all and so the foam was saturated and his skin is macerated. There is a light layer of slough on the wound surface. The underlying granulation tissue is robust and healthy-appearing. He has heavy callus buildup at the site of his first metatarsal head wound which is still healed. 02/01/2022: He has been in silver alginate. When he removed the dressing from his thigh wound, however, some leg, superficially reopening a portion of the wound that had healed. In addition, underneath the callus at his left first metatarsal head, there appears to be a blister and the wound appears to be open  again. 02/08/2022: The lateral leg wound has contracted substantially. There is eschar and a light layer of slough present. He says that it is starting to pull and is uncomfortable. On inspection, there is some puckering of the scar and the eschar is quite dry; this may account for his symptoms. On his first metatarsal head, the wound is much smaller with just some eschar on the surface. The callus has not reaccumulated. He reports that he had a blister come up on his medial thigh wound at the distal aspect. It popped and there is now an opening in his skin again. Looking back through his library of wound photos, there is what looks like a permanent suture just deep to this location and it may be trying to erode through. We have been using silver alginate on his wounds. 02/15/2022: The lateral leg wound is about half the size it was last week. It is clean with just a little perimeter eschar and light slough. The wound on his first metatarsal head is about the same with heavy callus overlying it. The medial thigh wound is closed again. He does have some skin changes on the top of his foot that looks potentially yeast related. 02/22/2022: The skin on the top of his foot improved with the use of a topical antifungal. The lateral leg wound continues to contract and is again smaller this week. There is a little bit of slough and eschar on the surface. The first metatarsal head wound is a little bit smaller but has reaccumulated a thick callus over the top. He decided to try to trim his toenail and ultimately took the entire nail off of his left great toe. COMMIE, FENDLEY ([id number]) [id number] Page 15 of 21 03/02/2022: His lateral leg wound continues to improve, as does the wound on his left great toe. Unfortunately, it appears that somehow his foot got wet and moisture seeped in through the opening causing his skin to lift. There is a large wound now overlying his first metatarsal on  both the plantar, medial, and dorsal portion of his foot. There is necrotic tissue and slough present underneath the shaggy macerated skin. 03/08/2022: The lateral leg wound is smaller again today. There is just a light layer of slough and eschar on the surface. The great toe wound is smaller again today. The first metatarsal wound is a little bit smaller today and does not look nearly as necrotic and macerated. There is still slough and nonviable tissue present. 03/15/2022: The lateral leg  wound is narrower and just has a little bit of light slough buildup. The first metatarsal wound still has a fair amount of moisture affecting the periwound skin. The great toe wound is healed. 03/22/2022: The lateral leg wound is now isolated to just at the level of his knee. There is some eschar and slough accumulation. The first metatarsal head wound has epithelialized tremendously and is about half the size that it was last week. He still has some maceration on the top of his foot and a fungal odor is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

## 2022-12-20 ENCOUNTER — Encounter (HOSPITAL_BASED_OUTPATIENT_CLINIC_OR_DEPARTMENT_OTHER): Payer: Medicare Other | Admitting: General Surgery

## 2022-12-20 DIAGNOSIS — E11621 Type 2 diabetes mellitus with foot ulcer: Secondary | ICD-10-CM | POA: Diagnosis not present

## 2022-12-20 NOTE — Progress Notes (Signed)
EMANUELE, MCWHIRTER ([id number]) [id number] Page 1 of 21 Visit Report for 12/20/2022 Chief Complaint Document Details Patient Name: Date of Service: [name], [name] 12/20/2022 10:00 A M Medical Record Number: [id number] Patient Account Number: [id number] Date of Birth/Sex: Treating RN: 1950/10/19 (71 y.o. M) Primary Care Provider: [name] Other Clinician: Referring Provider: Treating Provider/Extender: [name] in Treatment: 55 Information Obtained from: Patient Chief Complaint Left leg and foot ulcers 04/12/2021; patient is here for wounds on his left lower leg and left plantar foot over the first metatarsal head Electronic Signature(s) Signed: 12/20/2022 10:41:18 AM By: [name] MD FACS Entered By: [name] on 12/20/2022 10:41:18 -------------------------------------------------------------------------------- Debridement Details Patient Name: Date of Service: [name]. 12/20/2022 10:00 A M Medical Record Number: [id number] Patient Account Number: [id number] Date of Birth/Sex: Treating RN: 1951/04/27 (72 y.o. [name] Primary Care Provider: [name] Other Clinician: Referring Provider: Treating Provider/Extender: [name] in Treatment: 88 Debridement Performed for Assessment: Wound #22 Left,Proximal,Lateral Lower Leg Performed By: Physician [name], MD Debridement Type: Debridement Level of Consciousness (Pre-procedure): Awake and Alert Pre-procedure Verification/Time Out Yes - 10:14 Taken: Start Time: 10:14 Pain Control: Lidocaine 4% Topical Solution Percent of Wound Bed Debrided: 100% T Area Debrided (cm): otal 0.03 Tissue and other material debrided: Non-Viable, Eschar, Slough, Slough Level: Non-Viable Tissue Debridement Description: Selective/Open Wound Instrument: Curette Bleeding: Minimum Hemostasis Achieved: Pressure Response to  Treatment: Procedure was tolerated well Level of Consciousness (Post- Awake and Alert procedure): Post Debridement Measurements of Total Wound Length: (cm) 0.4 Width: (cm) 0.1 Depth: (cm) 0.1 Volume: (cm) 0.003 Character of Wound/Ulcer Post Debridement: Improved Post Procedure Diagnosis Same as [name] ([id number]) [id number] Page 2 of 21 Notes scribed for Dr. Lady Gary by [name], RN Electronic Signature(s) Signed: 12/20/2022 11:57:31 AM By: [name] MD FACS Signed: 12/20/2022 3:11:46 PM By: Gelene Mink By: [name] on 12/20/2022 10:16:31 -------------------------------------------------------------------------------- Debridement Details Patient Name: Date of Service: [name]. 12/20/2022 10:00 A M Medical Record Number: [id number] Patient Account Number: [id number] Date of Birth/Sex: Treating RN: 20-Dec-1950 (72 y.o. [name] Primary Care Provider: [name] Other Clinician: Referring Provider: Treating Provider/Extender: [name] in Treatment: 88 Debridement Performed for Assessment: Wound #18R Left,Plantar Metatarsal head first Performed By: Physician [name], MD Debridement Type: Debridement Severity of Tissue Pre Debridement: Fat layer exposed Level of Consciousness (Pre-procedure): Awake and Alert Pre-procedure Verification/Time Out Yes - 10:14 Taken: Start Time: 10:14 Pain Control: Lidocaine 4% Topical Solution Percent of Wound Bed Debrided: 100% T Area Debrided (cm): otal 1.87 Tissue and other material debrided: Non-Viable, Callus, Slough, Skin: Epidermis, Slough Level: Skin/Epidermis Debridement Description: Selective/Open Wound Instrument: Curette Bleeding: Minimum Hemostasis Achieved: Pressure Response to Treatment: Procedure was tolerated well Level of Consciousness (Post- Awake and Alert procedure): Post  Debridement Measurements of Total Wound Length: (cm) 1.4 Width: (cm) 1.7 Depth: (cm) 0.2 Volume: (cm) 0.374 Character of Wound/Ulcer Post Debridement: Improved Severity of Tissue Post Debridement: Fat layer exposed Post Procedure Diagnosis Same as Pre-procedure Notes scribed for Dr. Lady Gary by [name], RN Electronic Signature(s) Signed: 12/20/2022 11:57:31 AM By: [name] MD FACS Signed: 12/20/2022 3:11:46 PM By: [name] Entered By: [name] on 12/20/2022 10:19:35 HPI Details -------------------------------------------------------------------------------- [name] ([id number]) [id number] Page 3 of 21 Patient Name: Date of Service: [name], [name] 12/20/2022 10:00 A M Medical Record Number: [id number] Patient Account Number: [id number] Date of Birth/Sex: Treating RN:  1951/06/02 (71 y.o. M) Primary Care Provider: [name] Other Clinician: Referring Provider: Treating Provider/Extender: [name] in Treatment: 80 History of Present Illness HPI Description: 10/11/17; Mr. [name] is a 72 year old man who tells me that in 2015 he slipped down the latter traumatizing his left leg. He developed a wound in the same spot the area that we are currently looking at. He states this closed over for the most part although he always felt it was somewhat unstable. In 2016 he hit the same area with the door of his car had this reopened. He tells me that this is never really closed although sometimes an inflow it remains open on a constant basis. He has not been using any specific dressing to this except for topical antibiotics the nature of which were not really sure. His primary doctor did send him to see Dr. Jacinto Halim of interventional cardiology. He underwent an angiogram on 08/06/17 and he underwent a PTA and directional atherectomy of the lesser distal SFA and popliteal arteries which resulted in brisk  improvement in blood flow. It was noted that he had 2 vessel runoff through the anterior tibial and peroneal. He is also been to see vascular and interventional radiologist. He was not felt to have any significant superficial venous insufficiency. Presumably is not a candidate for any ablation. It was suggested he come here for wound care. The patient is a type II diabetic on insulin. He also has a history of venous insufficiency. ABIs on the left were noncompressible in our clinic 10/21/17; patient we admitted to the clinic last week. 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 We put Iodosorb on him after an aggressive debridement and 3 layer compression. He complained of pain in his ankle and itching with is skin in fact he scratched the area on the medial calf superiorly at the rim of our wraps and he has 2 small open areas in that location today which are new. I changed his primary dressing today to silver collagen. As noted he is already had revascularization and does not have any significant superficial venous insufficiency that would be amenable to ablation 10/28/17; patient admitted to the clinic 2 weeks ago. He has a smaller Wound. Scratch injury from last week revealed. There is large wound over the tibial area. This is smaller. Granulation looks healthy. No need for debridement. 11/04/17; the wound on the left lateral calf looks better. Improved dimensions. Surface of this looks better. We've been maintaining him and Kerlix Coban wraps. He finds this much more comfortable. Silver collagen dressing 11/11/17; left lateral Wound continues to look healthy be making progress. Using a #5 curet I removed removed nonviable skin from the surface of the wound and then necrotic debris from the wound surface. Surface of the wound continues to look healthy. He also has an open area on the left great toenail bed. We've been using topical  antibiotics. 11/19/17; left anterior lateral wound continues to look healthy but it's not closed. He also had a small wound above this on the left leg Initially traumatic wounds in the setting of significant chronic venous insufficiency and stasis dermatitis 11/25/17; left anterior wounds superiorly is closed still a small wound inferiorly. 12/02/17; left anterior tibial area. Arrives today with adherent callus. Post debridement clearly not completely closed. Hydrofera Blue under 3 layer compression. 12/09/17; left anterior tibia. Circumferential eschar however the wound bed looks stable to improved. We've  been using Hydrofera Blue under 3 layer compression 12/17/17; left anterior tibia. Apparently this was felt to be closed however when the wrap was taken off there is a skin tear to reopen wounds in the same area we've been using Hydrofera Blue under 3 layer compression 12/23/17 left anterior tibia. Not close to close this week apparently the [facility] was stuck to this again. Still circumferential eschar requiring debridement. I put a contact layer on this this time under the Hydrofera Blue 12/31/17; left anterior tibia. Wound is better slight amount of hyper-granulation. Using Hydrofera Blue over Adaptic. 01/07/18; left anterior tibia. The wound had some surface eschar however after this was removed he has no open wound.he was already revascularized by Dr. Jacinto Halim when he came to our clinic with atherectomy of the left SFA and popliteal artery. He was also sent to interventional radiology for venous reflux studies. He was not felt to have significant reflux but certainly has chronic venous changes of his skin with hemosiderin deposition around this area. He will definitely need to lubricate his skin and wear compression stocking and I've talked to him about this. READMISSION 05/26/2018 This is a now 72 year old man we cared for with traumatic wounds on his left anterior lower extremity. He had been  previously revascularized during that admission by Dr. Jacinto Halim. Apparently in follow-up Dr. Jacinto Halim noted that he had deterioration in his arterial status. He underwent a stent placement in the distal left SFA on 04/22/2018. 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 He went back to the angiography suite on 04/30/2018 he underwent PTA and balloon angioplasty of the occluded left mid anterior tibial artery, thrombotic occlusion went from 100 to 0% which reconstitutes the posterior tibial artery. He had thrombectomy and aspiration of the peroneal artery. The stent placed in the distal SFA left SFA was still occluded. 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 He has been recuperating at [facility] home in [facility] after the most recent hospitalization. He is going home tomorrow. He tells me that 2 weeks ago he traumatized the tip of his left fifth toe. He came in urgently for our review of this. This was a history of before I noted that Dr. Jacinto Halim had already noted dry gangrenous changes of the left fifth toe 06/09/2018; 2-week follow-up. I did contact Dr. Jacinto Halim after his last appointment and he apparently saw 1 of Dr. Verl Dicker colleagues the next day. He does not follow-up with Dr. Jacinto Halim himself until Thursday of this week. 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 Nevertheless there is no evidence of infection no drainage and no pain. He had a new area that this week when we were signing him in today on the left anterior mid tibia area, this is in close proximity to the previous wound we have dealt with in this clinic. 06/23/2018; 2-week follow-up. I did not receive a recent note from Dr. Jacinto Halim to review today. Our  office is trying to obtain this. He is apparently not planning to do further vascular interventions and wondered about compression to try and help with the patient's chronic venous insufficiency. However we are also concerned about the arterial flow. He arrives in clinic today with  a new area on the left third toe. The areas on the calf/anterior tibia are close to closing. The left fifth toe is still mummified using Betadine. -In reviewing things with the patient he has what sounds like claudication with mild to moderate amount of activity. 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 I prescribed Levaquin over the phone while we attempted to arrange a plan of care. However the patient called yesterday to report he had low-grade fever and he came in today acutely. There is been a marked deterioration in the left third toe with spreading cellulitis up into the dorsal left foot. He was referred to the emergency room. Readmission: 06/29/2020 patient presents today for reevaluation here in our clinic he was previously treated by Dr. Leanord Hawking at the latter part of 2019 in 2 the beginning of 2020. Subsequently we have not seen him since that time in the interim he did have evaluation with vein and vascular specialist specifically Dr. Bo Mcclintock who did perform quite extensive work for a left femoral to anterior tibial artery bypass. With that being said in the interim the patient has developed significant lymphedema and has wounds that he tells me have really never healed in regard to the incision site on the left leg. He also has multiple wounds on the feet for various reasons some of which is that he tends to pick at his feet. Fortunately there is no signs of active infection systemically at this time he does have PRINSTON, KYNARD ([id number]) [id number] Page 4 of 21 some wounds that are little bit deeper but most are fairly superficial he seems to  have good blood flow and overall everything appears to be healthy I see no bone exposed and no obvious signs of osteomyelitis. I do not know that he necessarily needs a x-ray at this point although that something we could consider depending on how things progress. The patient does have a history of lymphedema, diabetes, this is type II, chronic kidney disease stage III, hypertension, and history of peripheral vascular disease. 07/05/2020; patient admitted last week. Is a patient I remember from 2019 he had a spreading infection involving the left foot and we sent him to the hospital. 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 He subsequently had a left femoral to anterior tibial bypass by Dr.Cain vein and vascular. He also has severe lymphedema with chronic skin changes related to that on the left leg. The most problematic area that was new today was on the left medial great toe. This was apparently a small area last week there was purulent drainage which our intake nurse cultured. Also areas on the left medial foot and heel left lateral foot. He has 2 areas on the left medial calf left lateral calf in the setting of the severe lymphedema. 07/13/2020 on evaluation today patient appears to be doing better in my opinion compared to his last visit. The good news is there is no signs of active infection systemically and locally I do not see any signs of infection either. He did have an x-ray which was negative that is great news he had a culture which showed MRSA but at the same time he is been on the doxycycline which has helped. I do think we may want to extend this for 7 additional days 1/25; patient admitted to the clinic a few weeks ago. 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 We have been putting him under compression his edema control is a lot better but he is severe verricused skin on the left leg. He is really  done quite well he still has an open area on the left medial calf and the left medial first metatarsal head. We have been using silver collagen on the leg silver alginate on the foot 07/27/2020 upon evaluation today patient appears to be doing decently well in regard to his wounds. He still has a lot of dry skin on the left leg. Some of this is starting to peel back and I think he may be able to have them out by removing some that today. Fortunately there is no signs of active infection at this time on the left leg although on the right leg he does appear to have swelling and erythema as well as some mild warmth to touch. This does have been concerned about the possibility of cellulitis although within the differential diagnosis I do think that potentially a DVT has to be at least considered. We need to rule that out before proceeding would just call in the cellulitis. Especially since he is having pain in the posterior aspect of his calf muscle. 2/8; the patient had seen sparingly. He has severe skin changes of chronic lymphedema in the left leg thickened hyperkeratotic verrucous skin. He has an open wound on the medial part of the left first met head left mid tibia. He also has a rim of nonepithelialized skin in the anterior mid tibia. He brought in the AmLactin lotion that was been prescribed although I am not sure under compression and its utility. There concern about cellulitis on the right lower leg the last time he was here. He was put on on antibiotics. His DVT rule out was negative. The right leg looks fine he is using his stocking on this area 08/10/2020 upon evaluation today patient appears to be doing well with regard to his leg currently. He has been tolerating the dressing changes without complication. Fortunately there is no signs of active infection which is great news. Overall very pleased with where things stand. 2/22; the patient still has an area on the medial part of the left first  met his head. This looks better than when I last saw this earlier this month he has a rim of epithelialization but still some surface debris. Mostly everything on the left leg is healed. There is still a vulnerable in the left mid tibia area. 08/30/2020 upon evaluation today patient appears to be doing much better in regard to his wounds on his foot. 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 Nonetheless I think we will address that today I Minna send in a prescription for him in that regard. Overall though there does not appear to be any signs of significant worsening. 09/07/2020 on evaluation today patient's wounds over his left foot appear to be doing excellent. I do not see any signs of infection there is some callus buildup this can require debridement for certain but overall I feel like he is managing quite nicely. He still using the AmLactin cream which has been beneficial for him as well. 3/22; left foot wound is closed. There is no open area here. He is using ammonium lactate lotion to the lower extremities to help exfoliate dry cracked skin. He has compression  stockings from elastic therapy in [place]. The wound on the medial part of his left first met head is healed today. READMISSION 04/12/2021 Mr. Dahms is a patient we know fairly well he had a prolonged stay in clinic in 2019 with wounds on his left lateral and left anterior lower extremity in the setting of chronic venous insufficiency. More recently he was here earlier this year with predominantly an area on his left foot first metatarsal head plantar and he says the plantar foot broke down on its not long after we discharged him but he did not come back here. The last few months areas of broken down on his left anterior and again the left lateral lower extremity. The leg itself is very swollen  chronically enlarged a lot of hyperkeratotic dry Berry Q skin in the left lower leg. His edema extends well into the thigh. He was seen by Dr. Randie Heinz. He had ABIs on 03/02/2021 showing an ABI on the right of 1 with a TBI of 0.72 his ABI in the left at 1.09 TBI of 0.99. Monophasic and biphasic waveforms on the right. On the left monophasic waveforms were noted he went on to have an angiogram on 03/27/2021 this showed the aortic aortic and iliac segments were free of flow-limiting stenosis the left common femoral vein to evaluate the left femoral to anterior tibial artery bypass was unobstructed the bypass was patent without any areas of stenosis. We discharged the patient in bilateral juxta lite stockings but very clearly that was not sufficient to control the swelling and maintain skin integrity. He is clearly going to need compression pumps. 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 This is fortunate because he is on his feet for long periods of time. 10/27; patient comes in with our intake nurse reporting copious amount of green drainage from the left anterior mid tibia the left dorsal foot and to a lesser extent the left medial mid tibia. We left the compression wrap on all week for the amount of edema in his left leg is quite a bit better. We use silver alginate as the primary dressing 11/3; edema control is good. Left anterior lower leg left medial lower leg and the plantar first metatarsal head. The left anterior lower leg required debridement. Deep tissue culture I did of this wound showed MRSA I put him on 10 days of doxycycline which she will start today. We have him in compression wraps. He has a security card and [employer] however he is retiring on November 15. We will need to then get him into a better offloading boot for the left foot perhaps a total contact cast 11/10; edema control is quite good. Left anterior and left medial lower leg  wounds in the setting of chronic venous insufficiency and lymphedema. He also has a substantial area over the left plantar first metatarsal head. 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 He has significant hypergranulation on the left plantar foot wound. The patient is a diabetic but he does not have significant PAD 11/17; edema control is quite good. Left anterior and left medial lower leg wounds look better. The really concerning area remains the area on the left plantar first metatarsal head. He has a rim of epithelialization. He has been using a surgical shoe The patient is now retired from a  a [employer] I have gone over with him the need to offload this area aggressively. Starting today with a forefoot off loader but . possibly a total contact cast. 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 Arrives with a very significant odor from I think all of the wounds on the left leg and the left foot. Because of this I did not put a total contact cast on him today but will could still consider this. His wife was having cataract surgery which is the reason he missed the appointment 12/6. I saw this man 5 days ago with a swelling below the popliteal fossa. I thought he actually might have a Baker's cyst however the DVT rule out study that we could arrange right away was negative the technician told me this was not a ruptured Baker's cyst. We attempted to get this aspirated by under ultrasound guidance in interventional radiology however all they did was an ultrasound however it shows an extensive fluid collection 62 x 8 x 9.4 in the left thigh and left calf. The patient states he thinks this started 8 days ago or so but he really is not complaining of any pain, fever or systemic symptoms. He has not  ha 12/20; after some difficulty I managed to get the patient into see Dr. Randie Heinz. Eventually he was taken into the hospital and had a drain put in the fluid collection below his left knee posteriorly extending into the posterior thigh. He still has the drain in place. Culture of this showed moderate staff aureus few Morganella ROLLYN, SCIALDONE ([id number]) [id number] Page 5 of 21 and few Klebsiella he is now on doxycycline and ciprofloxacin as suggested by infectious disease he is on this for a month. The drain will remain in place until it stops draining 12/29; he comes in today with the 1 wound on his left leg and the area on the left plantar first met head significantly smaller. Both look healthy. He still has the drain in the left leg. He says he has to change this daily. Follows up with Dr. Randie Heinz on January 11. 06/29/2021; the wounds that I am following on the left leg and left first met head continued to be quite healthy. However the area where his inferior drain is in place had copious amounts of drainage which was green in color. The wound here is larger. Follows up with Dr. Pascal Lux of vein and vascular his surgeon next week as well as infectious disease. He remains on ciprofloxacin and doxycycline. He is not complaining of excessive pain in either one of the drain areas 1/12; the patient saw vascular surgery and infectious disease. Vascular surgery has left the drain in place as there was still some notable drainage still see him back in 2 weeks. Dr. Dorthula Perfect stop the doxycycline and ciprofloxacin and I do not believe he follows up with them at this point. Culture I did last week showed both doxycycline resistant MRSA and Pseudomonas not sensitive to ciprofloxacin although only in rare titers 1/19; the patient's wound on the left anterior lower leg is just about healed. We have continued healing of the area that was medially on the left leg. Left first plantar metatarsal head  continues to get smaller. The major problem here is his 2 drain sites 1 on the left upper calf and lateral thigh. There is purulent drainage  still from the left lateral thigh. I gave him antibiotics last week but we still have recultured. He has the drain in the area I think this is eventually going to have to come out. I suspect there will be a connecting wound to heal here perhaps with improved VAc 1/26; the patient had his drain removed by vein and vascular on 1/25/. This was a large pocket of fluid in his left thigh that seem to tunnel into his left upper calf. 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 His mention his Penrose drain was removed today. He now has a tunneling wound on his left calf and left thigh. Both of these probe widely towards each other although I cannot really prove that they connect. Both wounds on his lower leg anteriorly are closed and his area over the first metatarsal head on his right foot continues to improve. We are using Hydrofera Blue here. He also saw infectious disease culture of the abscess they noted was polymicrobial with MRSA, Morganella and Klebsiella he was treated with doxycycline and ciprofloxacin for 4 weeks ending on 07/03/2021. They did not recommend any further antibiotics. Notable that while he still had the Penrose drain in place last week he had purulent drainage coming out of the inferior IandD site this grew [facility], MRSA and Pseudomonas but there does not appear to be any active infection in this area today with the drain out and he is not systemically unwell 2/2; with regards to the drain sites the superior one on the thigh actually is closed down the one on the upper left lateral calf measures about 8 and half centimeters which is an improvement seems to be less prominent although still with a lot of drainage. The only remaining wound is over the first metatarsal head on the left foot and this looks to be continuing to  improve with Hydrofera Blue. 2/9; the area on his plantar left foot continues to contract. Callus around the wound edge. The drain sites specifically have not come down in depth. We put the wound VAC on Monday he changed the canister late last night our intake nurse reported a pocket of fluid perhaps caused by our compression wraps 2/16; continued improvement in left foot plantar wound. drainage site in the calf is not improved in terms of depth (wound vac) 2/23; continued improvement in the left foot wound over the first metatarsal head. With regards to the drain sites the area on his thigh laterally is healed however the open area on his calf is small in terms of circumference by still probes in by about 15 cm. Within using the wound VAC. Hydrofera Blue on his foot 08/24/2021: The left first metatarsal head wound continues to improve. The wound bed is healthy with just some surrounding callus. Unfortunately the open drain site on his calf remains open and tunnels at least 15 cm (the extent of a Q-tip). This is despite several weeks of wound VAC treatment. Based on reading back through the notes, there has been really no significant change in the depth of the wound, although the orifice is smaller and the more cranial wound on his thigh has closed. I suspect the tunnel tracks nearly all the way to this location. 08/31/2021: Continued improvement in the left first metatarsal head wound. There has been absolutely no improvement to the long tunnel from his open drain site on his calf. We have tried to get him into see vascular surgery sooner to consider the  possibility of simply filleting the tract open and allowing it to heal from the bottom up, likely with a wound VAC. They have not yet scheduled a sooner appointment than his current mid April 09/14/2021: He was seen by vascular surgery and they took him to the operating room last week. They opened a portion of the tunnel, but did not extend the entire  length of the known open subcutaneous tract. I read Dr. Darcella Cheshire operative note and it is not clear from that documentation why only a portion of the tract was opened. The heaped up granulation tissue was curetted and removed from at least some portion of the tract. They did place a wound VAC and applied an Unna boot to the leg. The ulcer on his left first metatarsal head is smaller today. The bed looks good and there is just a small amount of surrounding callus. 09/21/2021: The ulcer on his left first metatarsal head looks to be stalled. There is some callus surrounding the wound but the wound bed itself does not appear particularly dynamic. The tunnel tract on his lateral left leg seems to be roughly the same length or perhaps slightly smaller but the wound bed appears healthy with good granulation tissue. He opened up a new wound on his medial thigh and the site of a prior surgical incision. He says that he did this unconsciously in his sleep by scratching. 09/28/2021: Unfortunately, the ulcer on his left first metatarsal head has extended underneath the callus toward the dorsum of his foot. The medial thigh wounds are roughly the same. The tunnel on his lateral left leg continues to be problematic; it is longer than we are able to actually probe with a Q-tip. I am still not certain as to why Dr. Randie Heinz did not open this up entirely when he took the patient to the operating room. We will likely be back in the same situation with just a small superficial opening in a long unhealed tract, as the open portion is granulating in nicely. 10/02/2021: The patient was initially scheduled for a nurse visit, but we are also applying a total contact cast today. The plantar foot wound looks clean without significant accumulated callus. We have been applying Prisma silver collagen to the site. 10/05/2021: The patient is here for his first total contact cast change. We have tried using gauze packing strips in the tunnel on  his lateral leg wound, but this does not seem to be working any better than the white VAC foam. The foot ulcer looks about the same with minimal periwound callus. Medial thigh wound is clean with just some overlying eschar. 10/12/2021: The plantar foot wound is stable without any significant accumulation of periwound callus. The surface is viable with good granulation tissue. The medial thigh wounds are much smaller and are epithelializing. On the other hand, he had purulent drainage coming from the tunnel on his lateral leg. He does go back to see Dr. Randie Heinz next week and is planning to ask him why the wound tunnel was not completely opened at the time of his most recent operation. 10/19/2021: The plantar foot wound is markedly improved and has epithelial tissue coming through the surface. The medial thigh wounds are nearly closed with just a tiny open area. He did see Dr. Randie Heinz earlier this week and apparently they did discuss the possibility of opening the sinus tract further and enabling a wound VAC application. Apparently there are some limits as to what Dr. Randie Heinz feels comfortable opening, presumably  in relationship to his bypass graft. I think if we could get the tract open to the level of the popliteal fossa, this would greatly aid in her ability to get this chart closed. That being said, however, today when I probed the tract with a Q-tip, I was not able to insert the entirety of the Q-tip as I have on previous occasions. The tunnel is shorter by about 4 cm. The surface is clean with good granulation tissue and no further episodes of purulent drainage. 10/30/2021: Last week, the patient underwent surgery and had the long tract in his leg opened. There was a rind that was debrided, according to the operative report. His medial thigh ulcers are closed. The plantar foot wound is clean with a good surface and some built up surrounding callus. 11/06/2021: The overall dimensions of the large wound on his  lateral leg remain about the same, but there is good granulation tissue present and the tunneling is OWYN, [name] ([id number]) [id number] Page 6 of 21 a little bit shorter. He has a new wound on his anterior tibial surface, in the same location where he had a similar lesion in the past. The plantar foot wound is clean with some buildup surrounding callus. Just toward the medial aspect of his foot, however, there is an area of darkening that once debrided, revealed another opening in the skin surface. 11/13/2021: The anterior tibial surface wound is closed. The plantar foot wound has some surrounding callus buildup. The area of darkening that I debrided last week and revealed an opening in the skin surface has closed again. The tunnel in the large wound on his lateral leg has come in by about 3 cm. There is healthy granulation tissue on the entire wound surface. 11/23/2021: The patient was out of town last week and did wet-to-dry dressings on his large wound. 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 Unfortunately, he picked open the wound on his left medial thigh. He says that it was itching and he just could not stop scratching it until it was open again. The wound on his plantar foot is smaller and has not accumulated a tremendous amount of callus. The lateral leg wound is shallower and the tunnel has also decreased in depth. There is just a little bit of slough accumulation on the surface. 11/30/2021: Another portion of his left medial thigh has been opened up. All of these wounds are fairly superficial with just a little bit of slough and eschar accumulation. The wound on his plantar foot is almost closed with just a bit of eschar and periwound callus accumulation. The lateral leg wound is nearly flush with the surrounding skin and the tunnel is markedly shallower. 12/07/2021: There is just 1 open area on  his left medial thigh. It is clean with just a little bit of perimeter eschar. The wound on his plantar foot continues to contract and just has some eschar and periwound callus accumulation. The lateral leg wound is closing at the more distal aspect and the tunnel is smaller. The surface is nearly flush with the surrounding skin and it has a good bed of granulation tissue. 12/14/2021: The thigh and foot wounds are closed. The lateral leg wound has closed over approximately half of its length. The tunnel continues to contract and the surface is now flush with the surrounding skin. The wound bed has robust granulation tissue. 12/22/2021: The thigh and foot wounds have  reopened. The foot wound has a lot of callus accumulation around and over it. The thigh wound is tiny with just a little bit of slough in the wound bed. The lateral leg wound continues to contract. His vascular surgeon took the wound VAC off earlier in the week and the patient has been doing wet-to-dry dressings. There is a little slough accumulation on the surface. The tunnel is about 3 cm in depth at this point. 12/28/2021: The thigh wound is closed again. The foot wound has some callus that subsequently has peeled back exposing just a small slit of a wound. The lateral leg wound Is down to about half the size that it originally was and the tunnel is down to about half a centimeter in depth. 01/04/2022: The thigh wound remains closed. The foot wound has heavy callus overlying the wound site. Once this was debrided, the wound was found to be closed. The lateral leg wound is smaller again this week and very superficial. No tunnel could be identified. 01/12/2022: The thigh and foot wounds both remain closed. The lateral leg wound is now nearly flush with the skin surface. There is good granulation tissue present with a light layer of slough. 01/19/2022: Due to the way his wrap was placed, the patient did not change the dressing on his thigh at all  and so the foam was saturated and his skin is macerated. There is a light layer of slough on the wound surface. The underlying granulation tissue is robust and healthy-appearing. He has heavy callus buildup at the site of his first metatarsal head wound which is still healed. 02/01/2022: He has been in silver alginate. When he removed the dressing from his thigh wound, however, some leg, superficially reopening a portion of the wound that had healed. In addition, underneath the callus at his left first metatarsal head, there appears to be a blister and the wound appears to be open again. 02/08/2022: The lateral leg wound has contracted substantially. There is eschar and a light layer of slough present. He says that it is starting to pull and is uncomfortable. On inspection, there is some puckering of the scar and the eschar is quite dry; this may account for his symptoms. On his first metatarsal head, the wound is much smaller with just some eschar on the surface. The callus has not reaccumulated. He reports that he had a blister come up on his medial thigh wound at the distal aspect. It popped and there is now an opening in his skin again. Looking back through his library of wound photos, there is what looks like a permanent suture just deep to this location and it may be trying to erode through. We have been using silver alginate on his wounds. 02/15/2022: The lateral leg wound is about half the size it was last week. It is clean with just a little perimeter eschar and light slough. The wound on his first metatarsal head is about the same with heavy callus overlying it. The medial thigh wound is closed again. He does have some skin changes on the top of his foot that looks potentially yeast related. 02/22/2022: The skin on the top of his foot improved with the use of a topical antifungal. The lateral leg wound continues to contract and is again smaller this week. There is a little bit of slough and  eschar on the surface. The first metatarsal head wound is a little bit smaller but has reaccumulated a thick callus over the top. He  decided to try to trim his toenail and ultimately took the entire nail off of his left great toe. 03/02/2022: His lateral leg wound continues to improve, as does the wound on his left great toe. Unfortunately, it appears that somehow his foot got wet and moisture seeped in through the opening causing his skin to lift. There is a large wound now overlying his first metatarsal on both the plantar, medial, and dorsal portion of his foot. There is necrotic tissue and slough present underneath the shaggy macerated skin. 03/08/2022: The lateral leg wound is smaller again today. There is just a light layer of slough and eschar on the surface. The great toe wound is smaller again today. The first metatarsal wound is a little bit smaller today and does not look nearly as necrotic and macerated. There is still slough and nonviable tissue present. 03/15/2022: The lateral leg wound is narrower and just has a little bit of light slough buildup. The first metatarsal wound still has a fair amount of moisture affecting the periwound skin. The great toe wound is healed. 03/22/2022: The lateral leg wound is now isolated to just at the level of his knee. There is some eschar and slough accumulation. The first metatarsal head wound has epithelialized tremendously and is about half the size that it was last week. He still has some maceration on the top of his foot and a fungal odor is present. 03/29/2022: T oday the patient's foot was macerated, suggesting that the cast got wet. The patient has also been picking at his dry skin and has enlarged the wound on his left lateral leg. In the time between having his cast removed and my evaluation, he had picked more dry skin and opened up additional wounds on his Achilles area and dorsal foot. The plantar first metatarsal head wound, however, is smaller  and clean with just macerated callus around the perimeter and light slough on the surface. The lateral leg wound measured a little bit larger but is also fairly clean with eschar and minimal slough. 04/02/2022: The patient had vascular studies done last Friday and so his cast was not applied. He is here today to have that done. 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 04/05/2022: Both wounds are smaller and quite clean. There is just a little biofilm on the lateral leg wound. 10/20; the patient has a wound on the left lateral surgical incision at the level of his lateral knee this looks clean and improved. He is using silver alginate. He also has an area on his left medial foot for which she is using Hydrofera Blue under a total contact cast both wounds are measuring smaller 04/20/2022: The plantar foot wound has contracted considerably and is very close to closing. The lateral leg wound was measured a little larger, but there was a tiny open area that was included in the measurements that was not included last week. He has some eschar around the perimeter but otherwise the wound looks clean. 04/27/2022: The lateral leg wound looks better this week. He says that midweek, he felt it was very dry and began applying hydrogel to the site. I think this was beneficial. The foot wound is nearly closed underneath a thick layer of dry skin and callus. GABRYEL, FILES ([id number]) [id number] Page 7 of 21 05/04/2022: The foot wound is healed. He has developed a new small ulcer on his anterior tibial surface about midway up his leg. It has a little slough on the surface.  The lateral leg wound still is fairly dry, but clean with just a little biofilm on the surface. 05/11/2022: The wound on his foot reopened on Wednesday. A large blister formed which then broke open revealing the fat layer underneath. The ulcer on his anterior tibial surface is a little bit larger this week.  The lateral leg wound has much better moisture balance this week. Fortunately, prior to his foot wound reopening, he did get the cast made for his orthotic. 05/15/2022: Already, the left medial foot wound has improved. The tissue is less macerated and the surface is clean. The ulcer on his anterior tibial surface continues to enlarge. This seems likely secondary to accumulated moisture. The lateral leg wound continues to have an improved moisture balance with the use of collagen. 05/25/2022: The medial foot wound continues to contract. It is now substantially smaller with just a little slough on the surface. The anterior tibial surface wound continues to enlarge further. Once again, this seems to be secondary to moisture. The lateral leg wound does not seem to be changing much in size, but the moisture balance is better. 06/01/2022: The anterior tibial wound is closed. The medial foot wound is down to just a very small, couple of millimeters, opening. The lateral leg wound has good moisture balance, but remains unchanged in size. 12/15; the patient's anterior tibial wound has reopened, however the area on his right first metatarsal head is closed. The major wound is actually on the superior part of his surgical wound in the left lateral thigh. Not a completely viable surface under illumination. This may at some point require a debridement I think he is currently using Prisma. As noted the left medial foot wound has closed 06/14/2022: The anterior tibial wound has closed. The lateral leg wound has a better surface but is basically unchanged in size. The left medial foot wound has reopened. It looks as though there was some callus accumulation and moisture got under the callus which caused the tissue to break down again. 06/21/2022: A new wound has opened up just distal to the previous anterior tibial wound. It is small but has hypertrophic granulation tissue present. The lateral leg wound is a little  bit narrower and has a layer of slough on the surface. The left medial foot wound is down to just a pinhole. His custom orthotics should be available next week. 06/28/2022: The wound on his first metatarsal head has healed. He has developed a new small wound on his medial lower leg, in an old scar site. The lateral leg wound continues to contract but continues to accumulate slough, as well. 07/03/2022: Despite wearing his custom orthopedic shoes, he managed to reopen the wound on his first metatarsal head. He says he thinks his foot got wet and then some skin lifted up and he peeled this away. Both of the lower leg wounds are smaller and have some dry eschar on the surface. The lateral leg wound is quite a bit narrower today. 07/12/2022: The medial lower leg wound is closed. The anterior lower leg wound has contracted considerably. The lateral upper leg wound is narrower with a layer of slough on the surface. The first metatarsal head wound is also smaller, but had copious drainage which saturated the foam border dressing and resulted in some periwound tissue maceration. Fortunately there was no breakdown at this site. 07/19/2022: The lower leg shows signs of significant maceration; I think he must be sweating excessively inside his cast. There are several areas of  skin breakdown present. The wound on his foot is smaller and that on his lateral leg is narrower and is shorter by about a centimeter. 07/26/2022: Last week we used a zinc Coflex wrap prior to applying his total contact cast and this has had the effect of keeping his skin from getting macerated this week. The anterior leg wound has epithelialized substantially. The lateral leg wound is significantly smaller with just a bit of slough on the surface. The first metatarsal head wound is also smaller this week. 08/02/2022: The anterior leg wound was closed on arrival, but while he was sitting in the room, he picked it open again. The lateral leg wound is  smaller with just a little slough on the surface and the first metatarsal head wound has contracted further, as well. 08/09/2022: The first metatarsal head wound is covered with callus. Underneath the callus, it is nearly completely closed. The lateral leg wound is smaller again this week. The anterior leg wound looks better, but he has such heavy buildup of old skin, that moisture is getting underneath this, becoming trapped, and causing the underlying skin to get macerated and open up. 08/16/2022: The first metatarsal head wound is closed. The lateral leg wound continues to contract and is quite a bit smaller again this week. There is just a small, superficial opening remaining on his anterior tibial surface. 08/23/2022: The first metatarsal head wound has, by some miracle, remained closed. The lateral leg wound is substantially smaller with multiple areas of epithelialization. The anterior tibial surface wound is also quite a bit smaller and very clean. 08/30/2022: Unfortunately, his first metatarsal head wound opened up again. It happened in the same fashion as it has on prior occasions. Moisture got under dried skin/callus and created a wound when he removed his sock, taking the skin with it. The anterior tibial surface has a thick shell of hyperkeratotic skin. This has been contributing to ongoing repeat wounding events as moisture gets underneath this and causes tissue breakdown. 3/15; patient presents for follow-up. His anterior left leg wound has healed. He still has the wound to the left lateral aspect and left first met head. We have been using silver alginate and endoform to these areas under [employer]. He has no issues or complaints today. He has been taking Augmentin and reports improvement to his symptoms to the left first met head. 09/13/2022: He has accumulated more thick dry skin in sheets on his lower leg. The lateral leg wound is about the same size and the left first metatarsal  head wound is a little bit smaller. There is slough on both surfaces. There is callus buildup around the foot wound. 09/20/2022: The lateral leg wound is a little bit narrower and the left first metatarsal head wound also seems to have contracted slightly. There is slough on both surfaces. He has a little skin breakdown on his anterior tibial surface. 09/27/2022: The lateral leg wound continues to contract and is quite clean. The first metatarsal head wound is also smaller. There is some perimeter callus and slough accumulation on the foot. The anterior tibial surface is closed. 10/04/2022: Both of his wounds are smaller today, particularly the first metatarsal head wound. 10/18/2022: He missed his appointment last week and ended up cutting off his wrap on Saturday. The anterior tibial wound reopened. It is fairly superficial with a little bit of slough on the surface. His lateral leg wound is smaller with some slough and eschar buildup. The first metatarsal head wound  is also smaller with some callus and slough accumulation. 10/25/2022: All wounds are smaller. There is slough and eschar on the lateral leg and slough and callus on the plantar foot wound. The anterior tibial wound is clean and flush with the surrounding skin. No debris accumulation here. 11/01/2022: The wounds are all smaller again this week. There is slough on the lateral leg and some minimal slough and eschar on the anterior tibial wound. There is callus accumulation on the first metatarsal head site, along with slough. There is also a yeasty odor coming from the foot. 11/08/2022: The lateral leg wound is smaller except where he picked some skin while waiting to be seen. There is a little bit of slough on the surface. The anterior COLLYN, RIBAS ([id number]) [id number] Page 8 of 21 tibial wound is closed. The first metatarsal head site has gotten macerated once again and has a lot of spongy wet tissue and callus  around it. No yeast odor today. 11/15/2022: The lateral leg wound continues to contract. There is now a band of epithelium dividing it into 2 areas. Minimal slough accumulation. He managed to pick open a new wound on his medial lower leg. The first metatarsal head site is smaller with some callus accumulation but no tissue maceration. 11/22/2022: The lateral leg wound is smaller again by about a third today. There is a little bit of slough on the surface with some periwound eschar. The new wound that he picked open last week has healed but he picked open 3 new small wounds on his anterior tibial surface, once again due to his picking at his skin. The first metatarsal head wound looks about the same. There has been some callus accumulation and there is more edema present, as he was not put in a compression wrap last week. 11/29/2022: The lateral leg wound is smaller again today. There is a little periwound eschar and some slough present. The wounds on his anterior tibial surface are all closed except for 1 that has a little bit of slough on the open portion with eschar covering it. The first metatarsal head wound measured a little bit smaller today, but mostly looks about the same. Edema control is better. 12/06/2022: The anterior tibial wound is closed. The lateral leg wound is smaller with some slough and eschar accumulation. The plantar foot ulcer is about the same size, but the tissue does show some evidence of the fact that he was on his feet quite a bit more this past week; there was a death in his family. There has been more callus accumulation and the tissues are little bit more purpleish. 12/13/2022: He picked the skin on his anterior tibia and reopened a wound there while he was waiting to be seen in clinic. The lateral leg wound is much smaller with a little bit of slough and eschar. The plantar foot ulcer is smaller today with callus and slough buildup, but without the pressure induced tissue  injury that was seen last week. 12/20/2022: The anterior tibial wound has healed. The lateral leg wound is smaller again this week. There is a little bit of eschar buildup on the surface. The foot had a fairly strong odor coming from it that persisted even after washing. The wound itself looks like its gotten a little smaller but he has built up thick callus, once again. Electronic Signature(s) Signed: 12/20/2022 10:46:40 AM By: [name] MD FACS Previous Signature: 12/20/2022 10:42:01 AM Version By: [name] MD FACS Entered By: Lady Gary,  Marlin Brys on 12/20/2022 10:46:40 -------------------------------------------------------------------------------- Physical Exam Details Patient Name: Date of Service: [name], MOAN 12/20/2022 10:00 A M Medical Record Number: [id number] Patient Account Number: [id number] Date of Birth/Sex: Treating RN: 06-01-1951 (72 y.o. M) Primary Care Provider: [name] Other Clinician: Referring Provider: Treating Provider/Extender: [name] in Treatment: 7 Constitutional . . . . no acute distress. Respiratory Normal work of breathing on room air. Notes 12/20/2022: The anterior tibial wound has healed. The lateral leg wound is smaller again this week. There is a little bit of eschar buildup on the surface. The foot had a fairly strong odor coming from it that persisted even after washing. The wound itself looks like its gotten a little smaller but he has built up thick callus, once again. Electronic Signature(s) Signed: 12/20/2022 10:48:15 AM By: [name] MD FACS Entered By: [name] on 12/20/2022 10:48:15 -------------------------------------------------------------------------------- Physician Orders Details Patient Name: Date of Service: [name]. 12/20/2022 10:00 A M Medical Record Number: [id number] Patient Account Number: [id number] Date of Birth/Sex: Treating RN: [birth year] (72 y.o. [name] Primary Care Provider: [name] Other Clinician: Referring Provider: Treating Provider/Extender: Ryland, Tung[place] ([id number]) [id number] Page 9 of 21 Weeks in Treatment: 3 Verbal / Phone Orders: No Diagnosis Coding ICD-10 Coding Code Description 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.828 Non-pressure chronic ulcer of other part of left lower leg with other specified severity I87.322 Chronic venous hypertension (idiopathic) with inflammation of left lower extremity E11.51 Type 2 diabetes mellitus with diabetic peripheral angiopathy without gangrene I89.0 Lymphedema, not elsewhere classified L85.9 Epidermal thickening, unspecified Follow-up Appointments ppointment in 1 week. - Dr Lady Gary - Room 2 Return A Anesthetic (In clinic) Topical Lidocaine 4% applied to wound bed Bathing/ Shower/ Hygiene May shower with protection but do not get wound dressing(s) wet. Protect dressing(s) with water repellant cover (for example, large plastic bag) or a cast cover and may then take shower. Edema Control - Lymphedema / SCD / Other Elevate legs to the level of the heart or above for 30 minutes daily and/or when sitting for 3-4 times a day throughout the day. Avoid standing for long periods of time. Patient to wear own compression stockings every day. - both legs daily Moisturize legs daily. Compression stocking or Garment 20-30 mm/Hg pressure to: Off-Loading Wound #18R Left,Plantar Metatarsal head first Open toe surgical shoe to: - Front off loader Left ft Other: - minimal weight bearing left foot Wound Treatment Wound #18R - Metatarsal head first Wound Laterality: Plantar, Left Cleanser: Soap and Water 1 x Per Week/30 Days Discharge Instructions: May shower and wash wound with dial antibacterial soap and water prior to dressing change. Cleanser: Wound  Cleanser 1 x Per Week/30 Days Discharge Instructions: Cleanse the wound with wound cleanser prior to applying a clean dressing using gauze sponges, not tissue or cotton balls. Peri-Wound Care: Ketoconazole Cream 2% 1 x Per Week/30 Days Discharge Instructions: Apply Ketoconazole as directed Topical: Gentamicin 1 x Per Week/30 Days Discharge Instructions: As directed by physician Topical: Mupirocin Ointment 1 x Per Week/30 Days Discharge Instructions: Apply Mupirocin (Bactroban) as instructed Prim Dressing: Maxorb Extra Ag+ Alginate Dressing, 2x2 (in/in) 1 x Per Week/30 Days ary Discharge Instructions: Apply to wound bed as instructed Secondary Dressing: Optifoam Non-Adhesive Dressing, 4x4 in (Generic) 1 x Per Week/30 Days Discharge Instructions: Apply over primary dressing as directed. Secondary Dressing: Woven Gauze Sponges 2x2 in (Generic)  1 x Per Week/30 Days Discharge Instructions: Apply over primary dressing as directed. Secondary Dressing: Zetuvit Absorbent Pad, 4x4 (in/in) 1 x Per Week/30 Days Compression Wrap: Urgo K2, (equivalent to a 4 layer) two layer compression system, regular 1 x Per Week/30 Days Discharge Instructions: Apply Urgo K2 as directed (alternative to 4 layer compression). Wound #22 - Lower Leg Wound Laterality: Left, Lateral, Proximal Cleanser: Soap and Water 3 x Per Week/30 Days Discharge Instructions: May shower and wash wound with dial antibacterial soap and water prior to dressing change. Cleanser: Wound Cleanser 3 x Per Week/30 Days Discharge Instructions: Cleanse the wound with wound cleanser prior to applying a clean dressing using gauze sponges, not tissue or cotton balls. Peri-Wound Care: Skin Prep 3 x Per Week/30 Days LOYE, VENTO ([id number]) [id number] Page 10 of 21 Discharge Instructions: Use skin prep as directed Peri-Wound Care: Sween Lotion (Moisturizing lotion) 3 x Per Week/30 Days Discharge Instructions: Apply  moisturizing lotion to the leg Topical: Skintegrity Hydrogel 4 (oz) 3 x Per Week/30 Days Discharge Instructions: Apply hydrogel as directed Prim Dressing: Endoform 2x2 in 3 x Per Week/30 Days ary Discharge Instructions: Moisten with Hydrogel or saline Secondary Dressing: Zetuvit Plus 4x8 in 3 x Per Week/30 Days Discharge Instructions: Apply over primary dressing as directed. Secured With: Elastic Bandage 4 inch (ACE bandage) 3 x Per Week/30 Days Discharge Instructions: Secure with ACE bandage as directed. Patient Medications llergies: No Known Drug Allergies A Notifications Medication Indication Start End 12/20/2022 lidocaine DOSE topical 4 % cream - cream topical Electronic Signature(s) Signed: 12/20/2022 11:57:31 AM By: [name] MD FACS Entered By: [name] on 12/20/2022 10:48:34 -------------------------------------------------------------------------------- Problem List Details Patient Name: Date of Service: [name]. 12/20/2022 10:00 A M Medical Record Number: [id number] Patient Account Number: [id number] Date of Birth/Sex: Treating RN: 1951-01-21 (72 y.o. M) Primary Care Provider: [name] Other Clinician: Referring Provider: Treating Provider/Extender: [name] in Treatment: 22 Active Problems ICD-10 Encounter Code Description Active Date MDM Diagnosis L97.528 Non-pressure chronic ulcer of other part of left foot with other specified 08/26/2022 No Yes severity L97.828 Non-pressure chronic ulcer of other part of left lower leg with other specified 04/12/2021 No Yes severity I87.322 Chronic venous hypertension (idiopathic) with inflammation of left lower 04/12/2021 No Yes extremity E11.51 Type 2 diabetes mellitus with diabetic peripheral angiopathy without gangrene 04/12/2021 No Yes I89.0 Lymphedema, not elsewhere classified 04/12/2021 No Yes TESEAN, STUMP ([id number]) [id number] Page 11 of  21 L85.9 Epidermal thickening, unspecified 08/30/2022 No Yes Inactive Problems ICD-10 Code Description Active Date Inactive Date E11.621 Type 2 diabetes mellitus with foot ulcer 04/12/2021 04/12/2021 L97.828 Non-pressure chronic ulcer of other part of left lower leg with other specified severity 06/08/2022 06/08/2022 E11.42 Type 2 diabetes mellitus with diabetic polyneuropathy 04/12/2021 04/12/2021 L02.416 Cutaneous abscess of left lower limb 06/13/2021 06/13/2021 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 07/20/2021 07/20/2021 Resolved Problems Electronic Signature(s) Signed: 12/20/2022 10:40:56 AM By: [name] MD FACS Entered By: [name] on 12/20/2022 10:40:56 -------------------------------------------------------------------------------- Progress Note Details Patient Name: Date of Service: [name]. 12/20/2022 10:00 A M Medical Record Number: [id number] Patient Account Number: [id number] Date of Birth/Sex: Treating RN: 10/04/1950 (72 y.o. M) Primary Care Provider: [name] Other Clinician: Referring Provider: Treating Provider/Extender: [name] in Treatment: 70 Subjective Chief Complaint Information obtained from Patient Left leg and foot ulcers 04/12/2021; patient is here for wounds on his left lower leg and left plantar  foot over the first metatarsal head History of Present Illness (HPI) 10/11/17; Mr. Grasmick is a 72 year old man who tells me that in 2015 he slipped down the latter traumatizing his left leg. He developed a wound in the same spot the area that we are currently looking at. He states this closed over for the most part although he always felt it was somewhat unstable. In 2016 he hit the same area with the door of his car had this reopened. He tells me that this is never really closed although sometimes an inflow it remains open on a constant basis. He has not been using any specific  dressing to this except for topical antibiotics the nature of which were not really sure. His primary doctor did send him to see Dr. Jacinto Halim of interventional cardiology. He underwent an angiogram on 08/06/17 and he underwent a PTA and directional atherectomy of the lesser distal SFA and popliteal arteries which resulted in brisk improvement in blood flow. It was noted that he had 2 vessel runoff through the anterior tibial and peroneal. He is also been to see vascular and interventional radiologist. He was not felt to have any significant superficial venous insufficiency. Presumably is not a candidate for any ablation. It was suggested he come here for wound care. The patient is a type II diabetic on insulin. He also has a history of venous insufficiency. ABIs on the left were noncompressible in our clinic 10/21/17; patient we admitted to the clinic last week. 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 We put Iodosorb on him after an aggressive debridement and 3 layer compression. He complained of pain in his ankle and itching with is skin in fact he scratched the area on the medial calf superiorly at the rim of our wraps and he has 2 small open areas in that location today which are new. I changed his primary dressing today to silver collagen. As noted he is already had revascularization and does not have any significant superficial venous insufficiency that would be amenable to ablation 10/28/17; patient admitted to the clinic 2 weeks ago. He has a smaller Wound. Scratch injury from last week revealed. There is large wound over the tibial area. This is smaller. Granulation looks healthy. No need for debridement. TOMOYA, RINGWALD ([id number]) [id number] Page 12 of 21 11/04/17; the wound on the left lateral calf looks better. Improved dimensions. Surface of this looks better. We've been maintaining him and Kerlix  Coban wraps. He finds this much more comfortable. Silver collagen dressing 11/11/17; left lateral Wound continues to look healthy be making progress. Using a #5 curet I removed removed nonviable skin from the surface of the wound and then necrotic debris from the wound surface. Surface of the wound continues to look healthy. He also has an open area on the left great toenail bed. We've been using topical antibiotics. 11/19/17; left anterior lateral wound continues to look healthy but it's not closed. He also had a small wound above this on the left leg Initially traumatic wounds in the setting of significant chronic venous insufficiency and stasis dermatitis 11/25/17; left anterior wounds superiorly is closed still a small wound inferiorly. 12/02/17; left anterior tibial area. Arrives today with adherent callus. Post debridement clearly not completely closed. Hydrofera Blue under 3 layer compression. 12/09/17; left anterior tibia. Circumferential eschar however the wound bed looks stable to improved. We've been using Hydrofera Blue under 3 layer compression 12/17/17;  left anterior tibia. Apparently this was felt to be closed however when the wrap was taken off there is a skin tear to reopen wounds in the same area we've been using Hydrofera Blue under 3 layer compression 12/23/17 left anterior tibia. Not close to close this week apparently the [facility] was stuck to this again. Still circumferential eschar requiring debridement. I put a contact layer on this this time under the Hydrofera Blue 12/31/17; left anterior tibia. Wound is better slight amount of hyper-granulation. Using Hydrofera Blue over Adaptic. 01/07/18; left anterior tibia. The wound had some surface eschar however after this was removed he has no open wound.he was already revascularized by Dr. Jacinto Halim when he came to our clinic with atherectomy of the left SFA and popliteal artery. He was also sent to interventional radiology for venous  reflux studies. He was not felt to have significant reflux but certainly has chronic venous changes of his skin with hemosiderin deposition around this area. He will definitely need to lubricate his skin and wear compression stocking and I've talked to him about this. READMISSION 05/26/2018 This is a now 72 year old man we cared for with traumatic wounds on his left anterior lower extremity. He had been previously revascularized during that admission by Dr. Jacinto Halim. Apparently in follow-up Dr. Jacinto Halim noted that he had deterioration in his arterial status. He underwent a stent placement in the distal left SFA on 04/22/2018. 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 He went back to the angiography suite on 04/30/2018 he underwent PTA and balloon angioplasty of the occluded left mid anterior tibial artery, thrombotic occlusion went from 100 to 0% which reconstitutes the posterior tibial artery. He had thrombectomy and aspiration of the peroneal artery. The stent placed in the distal SFA left SFA was still occluded. 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 He has been recuperating at [facility] home in [facility] after the most recent hospitalization. He is going home tomorrow. He tells me that 2 weeks ago he traumatized the tip of his left fifth toe. He came in urgently for our review of this. This was a history of before I noted that Dr. Jacinto Halim had already noted dry gangrenous changes of the left fifth toe 06/09/2018; 2-week follow-up. I did contact Dr. Jacinto Halim after his last appointment and he apparently saw 1 of Dr. Verl Dicker colleagues the next day. He does not follow-up with Dr. Jacinto Halim himself until Thursday of this week. 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 Nevertheless there is no evidence of infection no drainage and no pain. He had a new area that this week when we were signing him in today on the left anterior mid tibia area, this is in close proximity to the previous wound we have dealt with in this clinic. 06/23/2018; 2-week follow-up. I did not receive a recent note from Dr. Jacinto Halim to review today. Our office is trying to obtain this. He is apparently not planning to do further vascular interventions and wondered about compression to try and help with the patient's chronic venous insufficiency. However we are also concerned about the arterial flow. He arrives in clinic today with a new area on the left third toe. The  areas on the calf/anterior tibia are close to closing. The left fifth toe is still mummified using Betadine. -In reviewing things with the patient he has what sounds like claudication with mild to moderate amount of activity. 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 I prescribed Levaquin over the phone while we attempted to arrange a plan of care. However the patient called yesterday to report he had low-grade fever and he came in today acutely. There is been a marked deterioration in the left third toe with spreading cellulitis up into the dorsal left foot. He was referred to the emergency room. Readmission: 06/29/2020 patient presents today for reevaluation here in our clinic he was previously treated by Dr. Leanord Hawking at the latter part of 2019 in 2 the beginning of 2020. Subsequently we have not seen him since that time in the interim he did have evaluation with vein and vascular specialist specifically Dr. Bo Mcclintock who did perform quite extensive work for a left femoral to anterior tibial artery bypass. With that being said in the interim the patient has developed significant lymphedema and has wounds that he tells me have really never healed in  regard to the incision site on the left leg. He also has multiple wounds on the feet for various reasons some of which is that he tends to pick at his feet. Fortunately there is no signs of active infection systemically at this time he does have some wounds that are little bit deeper but most are fairly superficial he seems to have good blood flow and overall everything appears to be healthy I see no bone exposed and no obvious signs of osteomyelitis. I do not know that he necessarily needs a x-ray at this point although that something we could consider depending on how things progress. The patient does have a history of lymphedema, diabetes, this is type II, chronic kidney disease stage III, hypertension, and history of peripheral vascular disease. 07/05/2020; patient admitted last week. Is a patient I remember from 2019 he had a spreading infection involving the left foot and we sent him to the hospital. 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 He subsequently had a left femoral to anterior tibial bypass by Dr.Cain vein and vascular. He also has severe lymphedema with chronic skin changes related to that on the left leg. The most problematic area that was new today was on the left medial great toe. This was apparently a small area last week there was purulent drainage which our intake nurse cultured. Also areas on the left medial foot and heel left lateral foot. He has 2 areas on the left medial calf left lateral calf in the setting of the severe lymphedema. 07/13/2020 on evaluation today patient appears to be doing better in my opinion compared to his last visit. The good news is there is no signs of active infection systemically and locally I do not see any signs of infection either. He did have an x-ray which was negative that is great news he had a culture which showed MRSA but at the same time he is been on the doxycycline which has helped. I do think we  may want to extend this for 7 additional days 1/25; patient admitted to the clinic a few weeks ago. 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 We have been putting him under compression his edema control is a lot  better but he is severe verricused skin on the left leg. He is really done quite well he still has an open area on the left medial calf and the left medial first metatarsal head. We have been using silver collagen on the leg silver alginate on the foot 07/27/2020 upon evaluation today patient appears to be doing decently well in regard to his wounds. He still has a lot of dry skin on the left leg. Some of this is starting to peel back and I think he may be able to have them out by removing some that today. Fortunately there is no signs of active infection at this time on the left leg although on the right leg he does appear to have swelling and erythema as well as some mild warmth to touch. This does have been concerned about the possibility of cellulitis although within the differential diagnosis I do think that potentially a DVT has to be at least considered. We need to rule that out before proceeding would just call in the cellulitis. Especially since he is having pain in the posterior aspect of his calf muscle. 2/8; the patient had seen sparingly. He has severe skin changes of chronic lymphedema in the left leg thickened hyperkeratotic verrucous skin. He has an open wound on the medial part of the left first met head left mid tibia. He also has a rim of nonepithelialized skin in the anterior mid tibia. He brought in the AmLactin lotion that was been prescribed although I am not sure under compression and its utility. There concern about cellulitis on the right lower leg the last time he was here. He was put on on antibiotics. His DVT rule out was negative. The right leg looks fine he is using his stocking on this area 08/10/2020 upon evaluation  today patient appears to be doing well with regard to his leg currently. He has been tolerating the dressing changes without complication. Fortunately there is no signs of active infection which is great news. Overall very pleased with where things stand. 2/22; the patient still has an area on the medial part of the left first met his head. This looks better than when I last saw this earlier this month he has a rim of YONI, LOBOS ([id number]) [id number] Page 13 of 21 epithelialization but still some surface debris. Mostly everything on the left leg is healed. There is still a vulnerable in the left mid tibia area. 08/30/2020 upon evaluation today patient appears to be doing much better in regard to his wounds on his foot. 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 Nonetheless I think we will address that today I Minna send in a prescription for him in that regard. Overall though there does not appear to be any signs of significant worsening. 09/07/2020 on evaluation today patient's wounds over his left foot appear to be doing excellent. I do not see any signs of infection there is some callus buildup this can require debridement for certain but overall I feel like he is managing quite nicely. He still using the AmLactin cream which has been beneficial for him as well. 3/22; left foot wound is closed. There is no open area here. He is using ammonium lactate lotion to the lower extremities to help exfoliate dry cracked skin. He has compression stockings from [employer] [facility] in [place]. The wound on  the medial part of his left first met head is healed today. READMISSION 04/12/2021 Mr. Pracht is a patient we know fairly well he had a prolonged stay in clinic in 2019 with wounds on his left lateral and left anterior lower extremity in the  setting of chronic venous insufficiency. More recently he was here earlier this year with predominantly an area on his left foot first metatarsal head plantar and he says the plantar foot broke down on its not long after we discharged him but he did not come back here. The last few months areas of broken down on his left anterior and again the left lateral lower extremity. The leg itself is very swollen chronically enlarged a lot of hyperkeratotic dry Berry Q skin in the left lower leg. His edema extends well into the thigh. He was seen by Dr. Randie Heinz. He had ABIs on 03/02/2021 showing an ABI on the right of 1 with a TBI of 0.72 his ABI in the left at 1.09 TBI of 0.99. Monophasic and biphasic waveforms on the right. On the left monophasic waveforms were noted he went on to have an angiogram on 03/27/2021 this showed the aortic aortic and iliac segments were free of flow-limiting stenosis the left common femoral vein to evaluate the left femoral to anterior tibial artery bypass was unobstructed the bypass was patent without any areas of stenosis. We discharged the patient in bilateral juxta lite stockings but very clearly that was not sufficient to control the swelling and maintain skin integrity. He is clearly going to need compression pumps. 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 This is fortunate because he is on his feet for long periods of time. 10/27; patient comes in with our intake nurse reporting copious amount of green drainage from the left anterior mid tibia the left dorsal foot and to a lesser extent the left medial mid tibia. We left the compression wrap on all week for the amount of edema in his left leg is quite a bit better. We use silver alginate as the primary dressing 11/3; edema control is good. Left anterior lower leg left medial lower leg and the plantar first metatarsal head. The left anterior lower leg required  debridement. Deep tissue culture I did of this wound showed MRSA I put him on 10 days of doxycycline which she will start today. We have him in compression wraps. He has a security card and [employer] however he is retiring on November 15. We will need to then get him into a better offloading boot for the left foot perhaps a total contact cast 11/10; edema control is quite good. Left anterior and left medial lower leg wounds in the setting of chronic venous insufficiency and lymphedema. He also has a substantial area over the left plantar first metatarsal head. 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 He has significant hypergranulation on the left plantar foot wound. The patient is a diabetic but he does not have significant PAD 11/17; edema control is quite good. Left anterior and left medial lower leg wounds look better. The really concerning area remains the area on the left plantar first metatarsal head. He has a rim of epithelialization. He has been using a surgical shoe The patient is now retired from a a [employer] I have gone over with him the  need to offload this area aggressively. Starting today with a forefoot off loader but . possibly a total contact cast. 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 Arrives with a very significant odor from I think all of the wounds on the left leg and the left foot. Because of this I did not put a total contact cast on him today but will could still consider this. His wife was having cataract surgery which is the reason he missed the appointment 12/6. I saw this man 5 days ago with a swelling below the popliteal fossa. I thought he actually might have a Baker's cyst however the DVT rule out study that we could arrange right away was negative the  technician told me this was not a ruptured Baker's cyst. We attempted to get this aspirated by under ultrasound guidance in interventional radiology however all they did was an ultrasound however it shows an extensive fluid collection 62 x 8 x 9.4 in the left thigh and left calf. The patient states he thinks this started 8 days ago or so but he really is not complaining of any pain, fever or systemic symptoms. He has not ha 12/20; after some difficulty I managed to get the patient into see Dr. Randie Heinz. Eventually he was taken into the hospital and had a drain put in the fluid collection below his left knee posteriorly extending into the posterior thigh. He still has the drain in place. Culture of this showed moderate staff aureus few Morganella and few Klebsiella he is now on doxycycline and ciprofloxacin as suggested by infectious disease he is on this for a month. The drain will remain in place until it stops draining 12/29; he comes in today with the 1 wound on his left leg and the area on the left plantar first met head significantly smaller. Both look healthy. He still has the drain in the left leg. He says he has to change this daily. Follows up with Dr. Randie Heinz on January 11. 06/29/2021; the wounds that I am following on the left leg and left first met head continued to be quite healthy. However the area where his inferior drain is in place had copious amounts of drainage which was green in color. The wound here is larger. Follows up with Dr. Pascal Lux of vein and vascular his surgeon next week as well as infectious disease. He remains on ciprofloxacin and doxycycline. He is not complaining of excessive pain in either one of the drain areas 1/12; the patient saw vascular surgery and infectious disease. Vascular surgery has left the drain in place as there was still some notable drainage still see him back in 2 weeks. Dr. Dorthula Perfect stop the doxycycline and ciprofloxacin and I do not believe he follows up with  them at this point. Culture I did last week showed both doxycycline resistant MRSA and Pseudomonas not sensitive to ciprofloxacin although only in rare titers 1/19; the patient's wound on the left anterior lower leg is just about healed. We have continued healing of the area that was medially on the left leg. Left first plantar metatarsal head continues to get smaller. The major problem here is his 2 drain sites 1 on the left upper calf and lateral thigh. There is purulent drainage still from the left lateral thigh. I gave him antibiotics last week but we still have recultured. He  has the drain in the area I think this is eventually going to have to come out. I suspect there will be a connecting wound to heal here perhaps with improved VAc 1/26; the patient had his drain removed by vein and vascular on 1/25/. This was a large pocket of fluid in his left thigh that seem to tunnel into his left upper calf. 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 His mention his Penrose drain was removed today. He now has a tunneling wound on his left calf and left thigh. Both of these probe widely towards each other although I cannot really prove that they connect. Both wounds on his lower leg anteriorly are closed and his area over the first metatarsal head on his right foot continues to improve. We are using Hydrofera Blue here. He also saw infectious disease culture of the abscess they noted was polymicrobial with MRSA, Morganella and Klebsiella he was treated with doxycycline and ciprofloxacin for 4 weeks ending on 07/03/2021. They did not recommend any further antibiotics. Notable that while he still had the Penrose drain in place last week he had purulent drainage coming out of the inferior IandD site this grew [facility], MRSA and Pseudomonas but there does not appear to be any active infection in this area today with the drain out and he is not systemically unwell 2/2; with regards to the  drain sites the superior one on the thigh actually is closed down the one on the upper left lateral calf measures about 8 and half [name], [name] ([id number]) [id number] Page 14 of 21 centimeters which is an improvement seems to be less prominent although still with a lot of drainage. The only remaining wound is over the first metatarsal head on the left foot and this looks to be continuing to improve with Hydrofera Blue. 2/9; the area on his plantar left foot continues to contract. Callus around the wound edge. The drain sites specifically have not come down in depth. We put the wound VAC on Monday he changed the canister late last night our intake nurse reported a pocket of fluid perhaps caused by our compression wraps 2/16; continued improvement in left foot plantar wound. drainage site in the calf is not improved in terms of depth (wound vac) 2/23; continued improvement in the left foot wound over the first metatarsal head. With regards to the drain sites the area on his thigh laterally is healed however the open area on his calf is small in terms of circumference by still probes in by about 15 cm. Within using the wound VAC. Hydrofera Blue on his foot 08/24/2021: The left first metatarsal head wound continues to improve. The wound bed is healthy with just some surrounding callus. Unfortunately the open drain site on his calf remains open and tunnels at least 15 cm (the extent of a Q-tip). This is despite several weeks of wound VAC treatment. Based on reading back through the notes, there has been really no significant change in the depth of the wound, although the orifice is smaller and the more cranial wound on his thigh has closed. I suspect the tunnel tracks nearly all the way to this location. 08/31/2021: Continued improvement in the left first metatarsal head wound. There has been absolutely no improvement to the long tunnel from his open drain site on his calf. We  have tried to get him into see vascular surgery sooner to consider the possibility of simply filleting the tract open and  allowing it to heal from the bottom up, likely with a wound VAC. They have not yet scheduled a sooner appointment than his current mid April 09/14/2021: He was seen by vascular surgery and they took him to the operating room last week. They opened a portion of the tunnel, but did not extend the entire length of the known open subcutaneous tract. I read Dr. Darcella Cheshire operative note and it is not clear from that documentation why only a portion of the tract was opened. The heaped up granulation tissue was curetted and removed from at least some portion of the tract. They did place a wound VAC and applied an Unna boot to the leg. The ulcer on his left first metatarsal head is smaller today. The bed looks good and there is just a small amount of surrounding callus. 09/21/2021: The ulcer on his left first metatarsal head looks to be stalled. There is some callus surrounding the wound but the wound bed itself does not appear particularly dynamic. The tunnel tract on his lateral left leg seems to be roughly the same length or perhaps slightly smaller but the wound bed appears healthy with good granulation tissue. He opened up a new wound on his medial thigh and the site of a prior surgical incision. He says that he did this unconsciously in his sleep by scratching. 09/28/2021: Unfortunately, the ulcer on his left first metatarsal head has extended underneath the callus toward the dorsum of his foot. The medial thigh wounds are roughly the same. The tunnel on his lateral left leg continues to be problematic; it is longer than we are able to actually probe with a Q-tip. I am still not certain as to why Dr. Randie Heinz did not open this up entirely when he took the patient to the operating room. We will likely be back in the same situation with just a small superficial opening in a long unhealed tract, as  the open portion is granulating in nicely. 10/02/2021: The patient was initially scheduled for a nurse visit, but we are also applying a total contact cast today. The plantar foot wound looks clean without significant accumulated callus. We have been applying Prisma silver collagen to the site. 10/05/2021: The patient is here for his first total contact cast change. We have tried using gauze packing strips in the tunnel on his lateral leg wound, but this does not seem to be working any better than the white VAC foam. The foot ulcer looks about the same with minimal periwound callus. Medial thigh wound is clean with just some overlying eschar. 10/12/2021: The plantar foot wound is stable without any significant accumulation of periwound callus. The surface is viable with good granulation tissue. The medial thigh wounds are much smaller and are epithelializing. On the other hand, he had purulent drainage coming from the tunnel on his lateral leg. He does go back to see Dr. Randie Heinz next week and is planning to ask him why the wound tunnel was not completely opened at the time of his most recent operation. 10/19/2021: The plantar foot wound is markedly improved and has epithelial tissue coming through the surface. The medial thigh wounds are nearly closed with just a tiny open area. He did see Dr. Randie Heinz earlier this week and apparently they did discuss the possibility of opening the sinus tract further and enabling a wound VAC application. Apparently there are some limits as to what Dr. 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 I think if  we could get the tract open to the level of the popliteal fossa, this would greatly aid in her ability to get this chart closed. That being said, however, today when I probed the tract with a Q-tip, I was not able to insert the entirety of the Q-tip as I have on previous occasions. The tunnel is shorter by about 4 cm. The surface is clean  with good granulation tissue and no further episodes of purulent drainage. 10/30/2021: Last week, the patient underwent surgery and had the long tract in his leg opened. There was a rind that was debrided, according to the operative report. His medial thigh ulcers are closed. The plantar foot wound is clean with a good surface and some built up surrounding callus. 11/06/2021: The overall dimensions of the large wound on his lateral leg remain about the same, but there is good granulation tissue present and the tunneling is a little bit shorter. He has a new wound on his anterior tibial surface, in the same location where he had a similar lesion in the past. The plantar foot wound is clean with some buildup surrounding callus. Just toward the medial aspect of his foot, however, there is an area of darkening that once debrided, revealed another opening in the skin surface. 11/13/2021: The anterior tibial surface wound is closed. The plantar foot wound has some surrounding callus buildup. The area of darkening that I debrided last week and revealed an opening in the skin surface has closed again. The tunnel in the large wound on his lateral leg has come in by about 3 cm. There is healthy granulation tissue on the entire wound surface. 11/23/2021: The patient was out of town last week and did wet-to-dry dressings on his large wound. 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 Unfortunately, he picked open the wound on his left medial thigh. He says that it was itching and he just could not stop scratching it until it was open again. The wound on his plantar foot is smaller and has not accumulated a tremendous amount of callus. The lateral leg wound is shallower and the tunnel has also decreased in depth. There is just a little bit of slough accumulation on the surface. 11/30/2021: Another portion of his left medial thigh has been opened up. All of  these wounds are fairly superficial with just a little bit of slough and eschar accumulation. The wound on his plantar foot is almost closed with just a bit of eschar and periwound callus accumulation. The lateral leg wound is nearly flush with the surrounding skin and the tunnel is markedly shallower. 12/07/2021: There is just 1 open area on his left medial thigh. It is clean with just a little bit of perimeter eschar. The wound on his plantar foot continues to contract and just has some eschar and periwound callus accumulation. The lateral leg wound is closing at the more distal aspect and the tunnel is smaller. The surface is nearly flush with the surrounding skin and it has a good bed of granulation tissue. 12/14/2021: The thigh and foot wounds are closed. The lateral leg wound has closed over approximately half of its length. The tunnel continues to contract and the surface is now flush with the surrounding skin. The wound bed has robust granulation tissue. 12/22/2021: The thigh and foot wounds have reopened. The foot wound has a lot of callus accumulation around and over it. The thigh wound is  tiny with just a little bit of slough in the wound bed. The lateral leg wound continues to contract. His vascular surgeon took the wound VAC off earlier in the week and the patient has been doing wet-to-dry dressings. There is a little slough accumulation on the surface. The tunnel is about 3 cm in depth at this point. 12/28/2021: The thigh wound is closed again. The foot wound has some callus that subsequently has peeled back exposing just a small slit of a wound. The lateral leg wound Is down to about half the size that it originally was and the tunnel is down to about half a centimeter in depth. 01/04/2022: The thigh wound remains closed. The foot wound has heavy callus overlying the wound site. Once this was debrided, the wound was found to be closed. The lateral leg wound is smaller again this week and very  superficial. No tunnel could be identified. HAGEN, BOHORQUEZ ([id number]) [id number] Page 15 of 21 01/12/2022: The thigh and foot wounds both remain closed. The lateral leg wound is now nearly flush with the skin surface. There is good granulation tissue present with a light layer of slough. 01/19/2022: Due to the way his wrap was placed, the patient did not change the dressing on his thigh at all and so the foam was saturated and his skin is macerated. There is a light layer of slough on the wound surface. The underlying granulation tissue is robust and healthy-appearing. He has heavy callus buildup at the site of his first metatarsal head wound which is still healed. 02/01/2022: He has been in silver alginate. When he removed the dressing from his thigh wound, however, some leg, superficially reopening a portion of the wound that had healed. In addition, underneath the callus at his left first metatarsal head, there appears to be a blister and the wound appears to be open again. 02/08/2022: The lateral leg wound has contracted substantially. There is eschar and a light layer of slough present. He says that it is starting to pull and is uncomfortable. On inspection, there is some puckering of the scar and the eschar is quite dry; this may account for his symptoms. On his first metatarsal head, the wound is much smaller with just some eschar on the surface. The callus has not reaccumulated. He reports that he had a blister come up on his medial thigh wound at the distal aspect. It popped and there is now an opening in his skin again. Looking back through his library of wound photos, there is what looks like a permanent suture just deep to this location and it may be trying to erode through. We have been using silver alginate on his wounds. 02/15/2022: The lateral leg wound is about half the size it was last week. It is clean with just a little perimeter eschar and light slough. The  wound on his first metatarsal head is about the same with heavy callus overlying it. The medial thigh wound is closed again. He does have some skin changes on the top of his foot that looks potentially yeast related. 02/22/2022: The skin on the top of his foot improved with the use of a topical antifungal. The lateral leg wound continues to contract and is again smaller this week. There is a little bit of slough and eschar on the surface. The first metatarsal head wound is a little bit smaller but has reaccumulated a thick callus over the top. He decided to try to trim his toenail and ultimately  took the entire nail off of his left great toe. 03/02/2022: His lateral leg wound continues to improve, as does the wound on his left great toe. Unfortunately, it appears that somehow his foot got wet and moisture seeped in through the opening causing his skin to lift. There is a large wound now overlying his first metatarsal on both the plantar, medial, and dorsal portion of his foot. There is necrotic tissue and slough present underneath the shaggy macerated skin. 03/08/2022: The lateral leg wound is smaller again today. There is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

## 2022-12-20 NOTE — Progress Notes (Signed)
ILIR, MAHRT ([id number]) [id number] Page 1 of 10 Visit Report for 12/20/2022 Arrival Information Details Patient Name: Date of Service: [name], [name] 12/20/2022 10:00 A M Medical Record Number: [id number] Patient Account Number: [id number] Date of Birth/Sex: Treating RN: [birth year] [name] (72 y.o. [name] Primary Care [name]: [name] Other Clinician: Referring [name]: Treating [name]/Extender: [name] in Treatment: 70 Visit Information History Since Last Visit Added or deleted any medications: No Patient Arrived: Ambulatory Any new allergies or adverse reactions: No Arrival Time: 10:01 Had a fall or experienced change in No Accompanied By: self activities of daily living that may affect Transfer Assistance: None risk of falls: Patient Identification Verified: Yes Signs or symptoms of abuse/neglect since last visito No Secondary Verification Process Completed: Yes Hospitalized since last visit: No Patient Requires Transmission-Based Precautions: No Implantable device outside of the clinic excluding No Patient Has Alerts: Yes cellular tissue based products placed in the center since last visit: Has Dressing in Place as Prescribed: Yes Has Compression in Place as Prescribed: Yes Pain Present Now: No Electronic Signature(s) Signed: 12/20/2022 3:11:46 PM By: [name] Entered By: [name] on 12/20/2022 10:01:57 -------------------------------------------------------------------------------- Compression Therapy Details Patient Name: Date of Service: [name]. 12/20/2022 10:00 A M Medical Record Number: [id number] Patient Account Number: [id number] Date of Birth/Sex: Treating RN: [name] (72 y.o. [name] Primary Care Daijah Scrivens: [name] Other Clinician: Referring Shaylen Nephew: Treating [name]/Extender: [name] in Treatment:  63 Compression Therapy Performed for Wound Assessment: Wound #18R Left,Plantar Metatarsal head first Performed By: Clinician [name] Bruin, RN Compression Type: Double Layer Post Procedure Diagnosis Same as Pre-procedure Electronic Signature(s) Signed: 12/20/2022 3:11:46 PM By: [name] By: [name] on 12/20/2022 10:17:12 [name] ([id number]) [id number] Page 2 of 10 -------------------------------------------------------------------------------- Encounter Discharge Information Details Patient Name: Date of Service: [name], [name] 12/20/2022 10:00 A M Medical Record Number: [id number] Patient Account Number: [id number] Date of Birth/Sex: Treating RN: [name] (72 y.o. [name] Primary Care Mee Macdonnell: [name] Other Clinician: Referring [name]: Treating [name]/Extender: [name] in Treatment: 17 Encounter Discharge Information Items Post Procedure Vitals Discharge Condition: Stable Temperature (F): 98.4 Ambulatory Status: Ambulatory Pulse (bpm): 61 Discharge Destination: Home Respiratory Rate (breaths/min): 16 Transportation: Private Auto Blood Pressure (mmHg): 135/78 Accompanied By: self Schedule Follow-up Appointment: Yes Clinical Summary of Care: Patient Declined Electronic Signature(s) Signed: 12/20/2022 3:11:46 PM By: [name] Entered By: [name] on 12/20/2022 10:38:27 -------------------------------------------------------------------------------- Lower Extremity Assessment Details Patient Name: Date of Service: [name]. 12/20/2022 10:00 A M Medical Record Number: [id number] Patient Account Number: [id number] Date of Birth/Sex: Treating RN: [name] (72 y.o. [name] Primary Care Meaghan Whistler: [name] Other Clinician: Referring [name]: Treating [name]/Extender: [name] in Treatment:  88 Edema Assessment Assessed: [Left: No] [Right: No] Edema: [Left: Ye] [Right: s] Calf Left: Right: Point of Measurement: 41 cm From Medial Instep 45.5 cm Ankle Left: Right: Point of Measurement: 10 cm From Medial Instep 29 cm Vascular Assessment Pulses: Dorsalis Pedis Palpable: [Left:Yes] Electronic Signature(s) Signed: 12/20/2022 3:11:46 PM By: [name] Entered By: [name] on 12/20/2022 10:08:32 Multi Wound Chart Details -------------------------------------------------------------------------------- [name] ([id number]) [id number] Page 3 of 10 Patient Name: Date of Service: [name] 12/20/2022 10:00 A M Medical Record Number: [id number] Patient Account Number: [id number] Date of Birth/Sex: Treating RN: [birth year] [name] (72 y.o. M) Primary Care [name]: [name] Other Clinician: Referring [name]: Treating  [name]/Extender: [name] in Treatment: 88 Vital Signs Height(in): 74 Capillary Blood Glucose(mg/dl): 025 Weight(lbs): 427 Pulse(bpm): 61 Body Mass Index(BMI): 30.6 Blood Pressure(mmHg): 135/78 Temperature(F): 98.4 Respiratory Rate(breaths/min): 16 Wound Assessments Wound Number: 18R 22 N/A Photos: N/A Left, Plantar Metatarsal head first Left, Proximal, Lateral Lower Leg N/A Wound Location: Gradually Appeared Bump N/A Wounding Event: Diabetic Wound/Ulcer of the Lower Cyst N/A Primary Etiology: Extremity Glaucoma, Sleep Apnea, Glaucoma, Sleep Apnea, N/A Comorbid History: Hypertension, Peripheral Arterial Hypertension, Peripheral Arterial Disease, Peripheral Venous Disease,Disease, Peripheral Venous Disease, Type II Diabetes, Gout, Osteoarthritis, Type II Diabetes, Gout, Osteoarthritis, Neuropathy Neuropathy 08/23/2020 06/03/2021 N/A Date Acquired: 41 80 N/A Weeks of Treatment: Open Open N/A Wound Status: Yes No N/A Wound Recurrence: No Yes N/A Clustered Wound: N/A  3 N/A Clustered Quantity: 1.4x1.7x0.2 0.4x0.1x0.1 N/A Measurements L x W x D (cm) 1.869 0.031 N/A A (cm) : rea 0.374 0.003 N/A Volume (cm) : 86.80% 98.10% N/A % Reduction in A rea: 86.80% 99.80% N/A % Reduction in Volume: Grade 2 Full Thickness With Exposed Support N/A Classification: Structures Medium Medium N/A Exudate A mount: Serosanguineous Serous N/A Exudate Type: red, brown amber N/A Exudate Color: Thickened Fibrotic scar, thickened scar N/A Wound Margin: Large (67-100%) Large (67-100%) N/A Granulation A mount: Red Red N/A Granulation Quality: Small (1-33%) Small (1-33%) N/A Necrotic A mount: Eschar, Adherent Slough Eschar, Adherent Slough N/A Necrotic Tissue: Fat Layer (Subcutaneous Tissue): Yes Fat Layer (Subcutaneous Tissue): Yes N/A Exposed Structures: Fascia: No Fascia: No Tendon: No Tendon: No Muscle: No Muscle: No Joint: No Joint: No Bone: No Bone: No Small (1-33%) Large (67-100%) N/A Epithelialization: Debridement - Selective/Open Wound Debridement - Selective/Open Wound N/A Debridement: Pre-procedure Verification/Time Out 10:14 10:14 N/A Taken: Lidocaine 4% T opical Solution Lidocaine 4% Topical Solution N/A Pain Control: Callus, Slough Necrotic/Eschar, Slough N/A Tissue Debrided: Skin/Epidermis Non-Viable Tissue N/A Level: 1.87 0.03 N/A Debridement A (sq cm): rea Curette Curette N/A Instrument: Minimum Minimum N/A Bleeding: Pressure Pressure N/A Hemostasis A chieved: Procedure was tolerated well Procedure was tolerated well N/A Debridement Treatment Response: 1.4x1.7x0.2 0.4x0.1x0.1 N/A Post Debridement Measurements L x W x D (cm) 0.374 0.003 N/A Post Debridement Volume: (cm) Callus: Yes Scarring: Yes N/A Periwound Skin Texture: Maceration: Yes Dry/Scaly: Yes N/A Periwound Skin Moisture: Dry/Scaly: No No Abnormalities Noted Hemosiderin Staining: Yes N/A Periwound Skin Color: No Abnormality No Abnormality  N/A Temperature: Compression Therapy Debridement N/A Procedures Performed: [name] ([id number]) [id number] Page 4 of 10 Debridement Treatment Notes Wound #18R (Metatarsal head first) Wound Laterality: Plantar, Left Cleanser Soap and Water Discharge Instruction: May shower and wash wound with dial antibacterial soap and water prior to dressing change. Wound Cleanser Discharge Instruction: Cleanse the wound with wound cleanser prior to applying a clean dressing using gauze sponges, not tissue or cotton balls. Peri-Wound Care Ketoconazole Cream 2% Discharge Instruction: Apply Ketoconazole as directed Topical Gentamicin Discharge Instruction: As directed by physician Mupirocin Ointment Discharge Instruction: Apply Mupirocin (Bactroban) as instructed Primary Dressing Maxorb Extra Ag+ Alginate Dressing, 2x2 (in/in) Discharge Instruction: Apply to wound bed as instructed Secondary Dressing Optifoam Non-Adhesive Dressing, 4x4 in Discharge Instruction: Apply over primary dressing as directed. Woven Gauze Sponges 2x2 in Discharge Instruction: Apply over primary dressing as directed. Zetuvit Absorbent Pad, 4x4 (in/in) Secured With Compression Wrap Urgo K2, (equivalent to a 4 layer) two layer compression system, regular Discharge Instruction: Apply Urgo K2 as directed (alternative to 4 layer compression). Compression Stockings Add-Ons Wound #22 (Lower Leg) Wound Laterality: Left, Lateral, Proximal Cleanser Soap  and Water Discharge Instruction: May shower and wash wound with dial antibacterial soap and water prior to dressing change. Wound Cleanser Discharge Instruction: Cleanse the wound with wound cleanser prior to applying a clean dressing using gauze sponges, not tissue or cotton balls. Peri-Wound Care Skin Prep Discharge Instruction: Use skin prep as directed Sween Lotion (Moisturizing lotion) Discharge Instruction: Apply moisturizing lotion to  the leg Topical Skintegrity Hydrogel 4 (oz) Discharge Instruction: Apply hydrogel as directed Primary Dressing Endoform 2x2 in Discharge Instruction: Moisten with Hydrogel or saline Secondary Dressing Zetuvit Plus 4x8 in Discharge Instruction: Apply over primary dressing as directed. Secured With Elastic Bandage 4 inch (ACE bandage) Discharge Instruction: Secure with ACE bandage as directed. [name], [name] ([id number]) [id number] Page 5 of 10 Compression Wrap Compression Stockings Add-Ons Electronic Signature(s) Signed: 12/20/2022 10:41:09 AM By: [name] MD FACS Entered By: [name] on 12/20/2022 10:41:09 -------------------------------------------------------------------------------- Multi-Disciplinary Care Plan Details Patient Name: Date of Service: [name]. 12/20/2022 10:00 A M Medical Record Number: [id number] Patient Account Number: [id number] Date of Birth/Sex: Treating RN: [name] (72 y.o. [name] Primary Care Quinita Kostelecky: [name] Other Clinician: Referring Ferdinando Lodge: Treating Regginald Pask/Extender: [name] in Treatment: 63 Multidisciplinary Care Plan reviewed with physician Active Inactive Venous Leg Ulcer Nursing Diagnoses: Knowledge deficit related to disease process and management Potential for venous Insuffiency (use before diagnosis confirmed) Goals: Patient will maintain optimal edema control Date Initiated: 07/27/2021 Target Resolution Date: 02/23/2023 Goal Status: Active Interventions: Assess peripheral edema status every visit. Treatment Activities: Therapeutic compression applied : 07/27/2021 Notes: Wound/Skin Impairment Nursing Diagnoses: Impaired tissue integrity Knowledge deficit related to ulceration/compromised skin integrity Goals: Patient will have a decrease in wound volume by X% from date: (specify in notes) Date Initiated: 04/12/2021 Date Inactivated:  01/04/2022 Target Resolution Date: 04/23/2021 Goal Status: Met Patient/caregiver will verbalize understanding of skin care regimen Date Initiated: 01/04/2022 Target Resolution Date: 02/23/2023 Goal Status: Active Ulcer/skin breakdown will have a volume reduction of 30% by week 4 Date Initiated: 04/12/2021 Date Inactivated: 04/27/2021 Target Resolution Date: 04/27/2021 Goal Status: Unmet Unmet Reason: infection Ulcer/skin breakdown will have a volume reduction of 50% by week 8 Date Initiated: 04/27/2021 Date Inactivated: 06/29/2021 Target Resolution Date: 06/24/2021 Goal Status: Met Interventions: Assess patient/caregiver ability to obtain necessary supplies Assess patient/caregiver ability to perform ulcer/skin care regimen upon admission and as needed Assess ulceration(s) every visit [name], [name] ([id number]) [id number] Page 6 of 10 Notes: Electronic Signature(s) Signed: 12/20/2022 3:11:46 PM By: [name] By: [name] on 12/20/2022 10:17:29 -------------------------------------------------------------------------------- Pain Assessment Details Patient Name: Date of Service: [name], [name] 12/20/2022 10:00 A M Medical Record Number: [id number] Patient Account Number: [id number] Date of Birth/Sex: Treating RN: [name][birth year] (72 y.o. [name] Primary Care Brittnay Pigman: [name] Other Clinician: Referring Jayro Mcmath: Treating Cecilia Vancleve/Extender: [name] in Treatment: 89 Active Problems Location of Pain Severity and Description of Pain Patient Has Paino No Site Locations Rate the pain. Current Pain Level: 0 Pain Management and Medication Current Pain Management: Electronic Signature(s) Signed: 12/20/2022 3:11:46 PM By: [name] Entered By: [name] on 12/20/2022 10:02:53 -------------------------------------------------------------------------------- Patient/Caregiver  Education Details Patient Name: Date of Service: [name], [name] 6/27/2024andnbsp10:00 A M Medical Record Number: [id number] Patient Account Number: [id number] Date of Birth/Gender: Treating RN: [name] (72 y.o. [name] Primary Care Physician: [name] Other Clinician: Referring Physician: Treating Physician/Extender: [name] in Treatment: [address] [place], VANTOL ([id number]) [id number] Page 7  of 10 Education Provided To: Patient Education Topics Provided Wound/Skin Impairment: Methods: Explain/Verbal Responses: Reinforcements needed, State content correctly Electronic Signature(s) Signed: 12/20/2022 3:11:46 PM By: [name] Entered By: [name] on 12/20/2022 10:17:57 -------------------------------------------------------------------------------- Wound Assessment Details Patient Name: Date of Service: [name]. 12/20/2022 10:00 A M Medical Record Number: [id number] Patient Account Number: [id number] Date of Birth/Sex: Treating RN: [birth year] (72 y.o. [name] Primary Care Moya Duan: [name] Other Clinician: Referring Arieliz Latino: Treating Jahara Dail/Extender: [name] in Treatment: 88 Wound Status Wound Number: 18R Primary Diabetic Wound/Ulcer of the Lower Extremity Etiology: Wound Location: Left, Plantar Metatarsal head first Wound Open Wounding Event: Gradually Appeared Status: Date Acquired: 08/23/2020 Comorbid Glaucoma, Sleep Apnea, Hypertension, Peripheral Arterial Disease, Weeks Of Treatment: 88 History: Peripheral Venous Disease, Type II Diabetes, Gout, Osteoarthritis, Clustered Wound: No Neuropathy Photos Wound Measurements Length: (cm) 1.4 Width: (cm) 1.7 Depth: (cm) 0.2 Area: (cm) 1.869 Volume: (cm) 0.374 % Reduction in Area: 86.8% % Reduction in Volume: 86.8% Epithelialization: Small (1-33%) Tunneling:  No Undermining: No Wound Description Classification: Grade 2 Wound Margin: Thickened Exudate Amount: Medium Exudate Type: Serosanguineous Exudate Color: red, brown Foul Odor After Cleansing: No Slough/Fibrino Yes Wound Bed Granulation Amount: Large (67-100%) Exposed Structure Granulation Quality: Red Fascia Exposed: No Necrotic Amount: Small (1-33%) Fat Layer (Subcutaneous Tissue) Exposed: Yes Necrotic Quality: Eschar, Adherent Slough Tendon Exposed: No Muscle Exposed: No [name] ([id number]) [id number] Page 8 of 10 Joint Exposed: No Bone Exposed: No Periwound Skin Texture Texture Color No Abnormalities Noted: No No Abnormalities Noted: Yes Callus: Yes Temperature / Pain Temperature: No Abnormality Moisture No Abnormalities Noted: No Dry / Scaly: No Maceration: Yes Treatment Notes Wound #18R (Metatarsal head first) Wound Laterality: Plantar, Left Cleanser Soap and Water Discharge Instruction: May shower and wash wound with dial antibacterial soap and water prior to dressing change. Wound Cleanser Discharge Instruction: Cleanse the wound with wound cleanser prior to applying a clean dressing using gauze sponges, not tissue or cotton balls. Peri-Wound Care Ketoconazole Cream 2% Discharge Instruction: Apply Ketoconazole as directed Topical Gentamicin Discharge Instruction: As directed by physician Mupirocin Ointment Discharge Instruction: Apply Mupirocin (Bactroban) as instructed Primary Dressing Maxorb Extra Ag+ Alginate Dressing, 2x2 (in/in) Discharge Instruction: Apply to wound bed as instructed Secondary Dressing Optifoam Non-Adhesive Dressing, 4x4 in Discharge Instruction: Apply over primary dressing as directed. Woven Gauze Sponges 2x2 in Discharge Instruction: Apply over primary dressing as directed. Zetuvit Absorbent Pad, 4x4 (in/in) Secured With Compression Wrap Urgo K2, (equivalent to a 4 layer) two layer compression  system, regular Discharge Instruction: Apply Urgo K2 as directed (alternative to 4 layer compression). Compression Stockings Add-Ons Electronic Signature(s) Signed: 12/20/2022 3:11:46 PM By: [name] Entered By: [name] on 12/20/2022 10:11:46 -------------------------------------------------------------------------------- Wound Assessment Details Patient Name: Date of Service: [name], [name] 12/20/2022 10:00 A M Medical Record Number: [id number] Patient Account Number: [id number] Date of Birth/Sex: Treating RN: [name][birth year] (73 y.o. [name] Primary Care Baljit Liebert: [name] Other Clinician: Referring Jalyric Kaestner: Treating Hayward Rylander/Extender: [name] in Treatment: [address], [place] ([id number]) [id number] Page 9 of 10 Wound Status Wound Number: 22 Primary Cyst Etiology: Wound Location: Left, Proximal, Lateral Lower Leg Wound Open Wounding Event: Bump Status: Date Acquired: 06/03/2021 Comorbid Glaucoma, Sleep Apnea, Hypertension, Peripheral Arterial Disease, Weeks Of Treatment: 80 History: Peripheral Venous Disease, Type II Diabetes, Gout, Osteoarthritis, Clustered Wound: Yes Neuropathy Photos Wound Measurements Length: (cm) Width: (cm) Depth: (cm) Clustered Quantity: Area: (cm) Volume: (cm) 0.4 %  Reduction in Area: 98.1% 0.1 % Reduction in Volume: 99.8% 0.1 Epithelialization: Large (67-100%) 3 Tunneling: No 0.031 Undermining: No 0.003 Wound Description Classification: Full Thickness With Exposed Support Structures Wound Margin: Fibrotic scar, thickened scar Exudate Amount: Medium Exudate Type: Serous Exudate Color: amber Foul Odor After Cleansing: No Slough/Fibrino Yes Wound Bed Granulation Amount: Large (67-100%) Exposed Structure Granulation Quality: Red Fascia Exposed: No Necrotic Amount: Small (1-33%) Fat Layer (Subcutaneous Tissue) Exposed: Yes Necrotic Quality: Eschar,  Adherent Slough Tendon Exposed: No Muscle Exposed: No Joint Exposed: No Bone Exposed: No Periwound Skin Texture Texture Color No Abnormalities Noted: No No Abnormalities Noted: No Scarring: Yes Hemosiderin Staining: Yes Moisture Temperature / Pain No Abnormalities Noted: No Temperature: No Abnormality Dry / Scaly: Yes Treatment Notes Wound #22 (Lower Leg) Wound Laterality: Left, Lateral, Proximal Cleanser Soap and Water Discharge Instruction: May shower and wash wound with dial antibacterial soap and water prior to dressing change. Wound Cleanser Discharge Instruction: Cleanse the wound with wound cleanser prior to applying a clean dressing using gauze sponges, not tissue or cotton balls. Peri-Wound Care Skin Prep Discharge Instruction: Use skin prep as directed Sween Lotion (Moisturizing lotion) Discharge Instruction: Apply moisturizing lotion to the leg Topical EULIS, SALAZAR ([id number]) [id number] Page 10 of 10 Skintegrity Hydrogel 4 (oz) Discharge Instruction: Apply hydrogel as directed Primary Dressing Endoform 2x2 in Discharge Instruction: Moisten with Hydrogel or saline Secondary Dressing Zetuvit Plus 4x8 in Discharge Instruction: Apply over primary dressing as directed. Secured With Elastic Bandage 4 inch (ACE bandage) Discharge Instruction: Secure with ACE bandage as directed. Compression Wrap Compression Stockings Add-Ons Electronic Signature(s) Signed: 12/20/2022 3:11:46 PM By: [name] Entered By: [name] on 12/20/2022 10:12:09 -------------------------------------------------------------------------------- Vitals Details Patient Name: Date of Service: [name]. 12/20/2022 10:00 A M Medical Record Number: [id number] Patient Account Number: [id number] Date of Birth/Sex: Treating RN: [birth year][name] (72 y.o. [name] Primary Care Shakirra Buehler: [name] Other Clinician: Referring  Keanen Dohse: Treating Cahlil Sattar/Extender: [name] in Treatment: 15 Vital Signs Time Taken: 10:02 Temperature (F): 98.4 Height (in): 74 Pulse (bpm): 61 Weight (lbs): 238 Respiratory Rate (breaths/min): 16 Body Mass Index (BMI): 30.6 Blood Pressure (mmHg): 135/78 Capillary Blood Glucose (mg/dl): 782 Reference Range: 80 - 120 mg / dl Electronic Signature(s) Signed: 12/20/2022 3:11:46 PM By: [name] Entered By: [name] on 12/20/2022 10:02:44

## 2022-12-26 ENCOUNTER — Encounter (HOSPITAL_BASED_OUTPATIENT_CLINIC_OR_DEPARTMENT_OTHER): Payer: Medicare Other | Attending: General Surgery | Admitting: General Surgery

## 2022-12-26 DIAGNOSIS — L97528 Non-pressure chronic ulcer of other part of left foot with other specified severity: Secondary | ICD-10-CM | POA: Diagnosis not present

## 2022-12-26 DIAGNOSIS — Z794 Long term (current) use of insulin: Secondary | ICD-10-CM | POA: Insufficient documentation

## 2022-12-26 DIAGNOSIS — E1122 Type 2 diabetes mellitus with diabetic chronic kidney disease: Secondary | ICD-10-CM | POA: Diagnosis not present

## 2022-12-26 DIAGNOSIS — G4730 Sleep apnea, unspecified: Secondary | ICD-10-CM | POA: Insufficient documentation

## 2022-12-26 DIAGNOSIS — E11621 Type 2 diabetes mellitus with foot ulcer: Secondary | ICD-10-CM | POA: Insufficient documentation

## 2022-12-26 DIAGNOSIS — I890 Lymphedema, not elsewhere classified: Secondary | ICD-10-CM | POA: Insufficient documentation

## 2022-12-26 DIAGNOSIS — N183 Chronic kidney disease, stage 3 unspecified: Secondary | ICD-10-CM | POA: Insufficient documentation

## 2022-12-26 DIAGNOSIS — E1140 Type 2 diabetes mellitus with diabetic neuropathy, unspecified: Secondary | ICD-10-CM | POA: Insufficient documentation

## 2022-12-26 DIAGNOSIS — H409 Unspecified glaucoma: Secondary | ICD-10-CM | POA: Diagnosis not present

## 2022-12-26 DIAGNOSIS — L97829 Non-pressure chronic ulcer of other part of left lower leg with unspecified severity: Secondary | ICD-10-CM | POA: Diagnosis not present

## 2022-12-26 DIAGNOSIS — L97828 Non-pressure chronic ulcer of other part of left lower leg with other specified severity: Secondary | ICD-10-CM | POA: Insufficient documentation

## 2022-12-26 DIAGNOSIS — I872 Venous insufficiency (chronic) (peripheral): Secondary | ICD-10-CM | POA: Insufficient documentation

## 2022-12-26 DIAGNOSIS — E1151 Type 2 diabetes mellitus with diabetic peripheral angiopathy without gangrene: Secondary | ICD-10-CM | POA: Diagnosis not present

## 2022-12-26 DIAGNOSIS — M1990 Unspecified osteoarthritis, unspecified site: Secondary | ICD-10-CM | POA: Diagnosis not present

## 2022-12-26 DIAGNOSIS — I129 Hypertensive chronic kidney disease with stage 1 through stage 4 chronic kidney disease, or unspecified chronic kidney disease: Secondary | ICD-10-CM | POA: Insufficient documentation

## 2022-12-26 NOTE — Progress Notes (Signed)
BARNES, SCHONBERGER ([id number]) [id number] Page 1 of 9 Visit Report for 12/26/2022 Arrival Information Details Patient Name: Date of Service: [name], [name] 12/26/2022 10:00 A M Medical Record Number: [id number] Patient Account Number: [id number] Date of Birth/Sex: Treating RN: 1950/09/19 (72 y.o. [name] Primary Care Sherrica Niehaus: [name] Other Clinician: Referring Donte Kary: Treating Anuar Walgren/Extender: [name] in Treatment: 40 Visit Information History Since Last Visit Added or deleted any medications: No Patient Arrived: Ambulatory Any new allergies or adverse reactions: No Arrival Time: 10:12 Had a fall or experienced change in No Accompanied By: self activities of daily living that may affect Transfer Assistance: None risk of falls: Patient Identification Verified: Yes Signs or symptoms of abuse/neglect since last visito No Secondary Verification Process Completed: Yes Hospitalized since last visit: No Patient Requires Transmission-Based Precautions: No Implantable device outside of the clinic excluding No Patient Has Alerts: Yes cellular tissue based products placed in the center since last visit: Has Dressing in Place as Prescribed: Yes Has Compression in Place as Prescribed: Yes Pain Present Now: No Electronic Signature(s) Signed: 12/26/2022 4:16:02 PM By: [name] Entered By: [name] on 12/26/2022 10:12:37 -------------------------------------------------------------------------------- Compression Therapy Details Patient Name: Date of Service: [name] Grapes. 12/26/2022 10:00 A M Medical Record Number: [id number] Patient Account Number: [id number] Date of Birth/Sex: Treating RN: [birth year] (72 y.o. [name] Primary Care Avyan Livesay: [name] Other Clinician: Referring Aujanae Mccullum: Treating Allard Lightsey/Extender: [name] in Treatment: 08 Compression  Therapy Performed for Wound Assessment: Wound #22 Left,Proximal,Lateral Lower Leg Performed By: Clinician [name], RN Compression Type: Double Layer Post Procedure Diagnosis Same as Pre-procedure Electronic Signature(s) Signed: 12/26/2022 4:16:02 PM By: Gelene Mink By: [name] on 12/26/2022 10:23:17 [name] ([id number]) [id number] Page 2 of 9 -------------------------------------------------------------------------------- Encounter Discharge Information Details Patient Name: Date of Service: [name], [name] 12/26/2022 10:00 A M Medical Record Number: [id number] Patient Account Number: [id number] Date of Birth/Sex: Treating RN: 13-Sep-1950 (72 y.o. [name] Primary Care Tangee Marszalek: [name] Other Clinician: Referring Stellar Gensel: Treating Asaph Serena/Extender: [name] in Treatment: 65 Encounter Discharge Information Items Post Procedure Vitals Discharge Condition: Stable Temperature (F): 99.2 Ambulatory Status: Ambulatory Pulse (bpm): 64 Discharge Destination: Home Respiratory Rate (breaths/min): 18 Transportation: Private Auto Blood Pressure (mmHg): 143/73 Accompanied By: self Schedule Follow-up Appointment: Yes Clinical Summary of Care: Patient Declined Electronic Signature(s) Signed: 12/26/2022 4:16:02 PM By: [name] Entered By: [name] on 12/26/2022 12:59:01 -------------------------------------------------------------------------------- Lower Extremity Assessment Details Patient Name: Date of Service: [name], [name] 12/26/2022 10:00 A M Medical Record Number: [id number] Patient Account Number: [id number] Date of Birth/Sex: Treating RN: Jan 30, 1951 (72 y.o. [name] Primary Care Emiley Digiacomo: [name] Other Clinician: Referring Karuna Balducci: Treating Aubrynn Katona/Extender: [name] in Treatment: 89 Edema  Assessment Assessed: [name]: No] [Right: No] Edema: [Left: Ye] [Right: s] Calf Left: Right: Point of Measurement: 41 cm From Medial Instep 44 cm Ankle Left: Right: Point of Measurement: 10 cm From Medial Instep 27.9 cm Vascular Assessment Pulses: Dorsalis Pedis Palpable: [Left:Yes] Electronic Signature(s) Signed: 12/26/2022 4:16:02 PM By: [name] Entered By: [name] on 12/26/2022 10:13:29 Multi Wound Chart Details -------------------------------------------------------------------------------- [name] ([id number]) [id number] Page 3 of 9 Patient Name: Date of Service: [name], [name] 12/26/2022 10:00 A M Medical Record Number: [id number] Patient Account Number: [id number] Date of Birth/Sex: Treating RN: May 25, 1951 (72 y.o. M) Primary Care Jessalynn Mccowan: [name] Other Clinician: Referring Alon Mazor: Treating Yasmeen Manka/Extender:  Pandora Leiter Weeks in Treatment: 89 Vital Signs Height(in): 74 Pulse(bpm): 64 Weight(lbs): 238 Blood Pressure(mmHg): 143/73 Body Mass Index(BMI): 30.6 Temperature(F): 99.2 Respiratory Rate(breaths/min): 18 Wound Assessments Wound Number: 18R 22 N/A Photos: N/A Left, Plantar Metatarsal head first Left, Proximal, Lateral Lower Leg N/A Wound Location: Gradually Appeared Bump N/A Wounding Event: Diabetic Wound/Ulcer of the Lower Cyst N/A Primary Etiology: Extremity Glaucoma, Sleep Apnea, Glaucoma, Sleep Apnea, N/A Comorbid History: Hypertension, Peripheral Arterial Hypertension, Peripheral Arterial Disease, Peripheral Venous Disease,Disease, Peripheral Venous Disease, Type II Diabetes, Gout, Osteoarthritis, Type II Diabetes, Gout, Osteoarthritis, Neuropathy Neuropathy 08/23/2020 06/03/2021 N/A Date Acquired: 73 81 N/A Weeks of Treatment: Open Open N/A Wound Status: Yes No N/A Wound Recurrence: No Yes N/A Clustered Wound: N/A 3 N/A Clustered Quantity: 1.5x1.8x0.2  2.2x0.3x0.1 N/A Measurements L x W x D (cm) 2.121 0.518 N/A A (cm) : rea 0.424 0.052 N/A Volume (cm) : 85.00% 68.60% N/A % Reduction in A rea: 85.00% 96.10% N/A % Reduction in Volume: Grade 2 Full Thickness With Exposed Support N/A Classification: Structures Medium Medium N/A Exudate A mount: Serosanguineous Serous N/A Exudate Type: red, brown amber N/A Exudate Color: Thickened Fibrotic scar, thickened scar N/A Wound Margin: Large (67-100%) Large (67-100%) N/A Granulation A mount: Red Red N/A Granulation Quality: Small (1-33%) Small (1-33%) N/A Necrotic A mount: Fat Layer (Subcutaneous Tissue): Yes Fat Layer (Subcutaneous Tissue): Yes N/A Exposed Structures: Fascia: No Fascia: No Tendon: No Tendon: No Muscle: No Muscle: No Joint: No Joint: No Bone: No Bone: No Small (1-33%) Large (67-100%) N/A Epithelialization: Debridement - Selective/Open Wound Debridement - Selective/Open Wound N/A Debridement: Pre-procedure Verification/Time Out 10:19 10:19 N/A Taken: Lidocaine 4% Topical Solution Lidocaine 4% Topical Solution N/A Pain Control: [employer] N/A Tissue Debrided: Skin/Epidermis Skin/Epidermis N/A Level: 2.12 0.52 N/A Debridement A (sq cm): rea Curette Curette N/A Instrument: Minimum Minimum N/A Bleeding: Pressure Pressure N/A Hemostasis A chieved: Procedure was tolerated well Procedure was tolerated well N/A Debridement Treatment Response: 1.5x1.8x0.2 2.2x0.3x0.1 N/A Post Debridement Measurements L x W x D (cm) 0.424 0.052 N/A Post Debridement Volume: (cm) Callus: Yes Scarring: Yes N/A Periwound Skin Texture: Maceration: Yes Dry/Scaly: No N/A Periwound Skin Moisture: Dry/Scaly: No No Abnormalities Noted Hemosiderin Staining: Yes N/A Periwound Skin Color: No Abnormality No Abnormality N/A Temperature: Debridement Compression Therapy N/A Procedures Performed: Debridement [name] ([id number]) [id number]  Page 4 of 9 Treatment Notes Electronic Signature(s) Signed: 12/26/2022 10:33:04 AM By: [name] MD FACS Entered By: [name] on 12/26/2022 10:33:04 -------------------------------------------------------------------------------- Multi-Disciplinary Care Plan Details Patient Name: Date of Service: [name] Grapes. 12/26/2022 10:00 A M Medical Record Number: [id number] Patient Account Number: [id number] Date of Birth/Sex: Treating RN: 01-Aug-1950 (72 y.o. [name] Primary Care Belkys Henault: [name] Other Clinician: Referring Darrill Vreeland: Treating Delonda Coley/Extender: [name] in Treatment: 57 Multidisciplinary Care Plan reviewed with physician Active Inactive Venous Leg Ulcer Nursing Diagnoses: Knowledge deficit related to disease process and management Potential for venous Insuffiency (use before diagnosis confirmed) Goals: Patient will maintain optimal edema control Date Initiated: 07/27/2021 Target Resolution Date: 02/23/2023 Goal Status: Active Interventions: Assess peripheral edema status every visit. Treatment Activities: Therapeutic compression applied : 07/27/2021 Notes: Wound/Skin Impairment Nursing Diagnoses: Impaired tissue integrity Knowledge deficit related to ulceration/compromised skin integrity Goals: Patient will have a decrease in wound volume by X% from date: (specify in notes) Date Initiated: 04/12/2021 Date Inactivated: 01/04/2022 Target Resolution Date: 04/23/2021 Goal Status: Met Patient/caregiver will verbalize understanding of skin care regimen Date Initiated: 01/04/2022 Target Resolution Date:  02/23/2023 Goal Status: Active Ulcer/skin breakdown will have a volume reduction of 30% by week 4 Date Initiated: 04/12/2021 Date Inactivated: 04/27/2021 Target Resolution Date: 04/27/2021 Goal Status: Unmet Unmet Reason: infection Ulcer/skin breakdown will have a volume reduction of 50% by week 8 Date Initiated:  04/27/2021 Date Inactivated: 06/29/2021 Target Resolution Date: 06/24/2021 Goal Status: Met Interventions: Assess patient/caregiver ability to obtain necessary supplies Assess patient/caregiver ability to perform ulcer/skin care regimen upon admission and as needed Assess ulceration(s) every visit Notes: [name], [name] ([id number]) [phone].pdf Page 5 of 9 Electronic Signature(s) Signed: 12/26/2022 4:16:02 PM By: [name] Entered By: [name] on 12/26/2022 10:21:34 -------------------------------------------------------------------------------- Pain Assessment Details Patient Name: Date of Service: [name], [name] 12/26/2022 10:00 A M Medical Record Number: [id number] Patient Account Number: [id number] Date of Birth/Sex: Treating RN: 1950/09/20 (72 y.o. [name] Primary Care Lyla Jasek: [name] Other Clinician: Referring Sandhya Denherder: Treating Narcissus Detwiler/Extender: [name] in Treatment: 28 Active Problems Location of Pain Severity and Description of Pain Patient Has Paino No Site Locations Rate the pain. Current Pain Level: 0 Pain Management and Medication Current Pain Management: Electronic Signature(s) Signed: 12/26/2022 4:16:02 PM By: [name] Entered By: [name] on 12/26/2022 10:12:57 -------------------------------------------------------------------------------- Patient/Caregiver Education Details Patient Name: Date of Service: [name], [name] 7/3/2024andnbsp10:00 A M Medical Record Number: [id number] Patient Account Number: [id number] Date of Birth/Gender: Treating RN: 1950/07/13 (72 y.o. [name] Primary Care Physician: [name] Other Clinician: Referring Physician: Treating Physician/Extender: [name] in Treatment: 79 Education Assessment Education Provided To: Patient EURAL, HOVORKA ([id number])  [id number] Page 6 of 9 Education Topics Provided Wound/Skin Impairment: Methods: Explain/Verbal Responses: Reinforcements needed, State content correctly Electronic Signature(s) Signed: 12/26/2022 4:16:02 PM By: [name] Entered By: [name] on 12/26/2022 10:21:48 -------------------------------------------------------------------------------- Wound Assessment Details Patient Name: Date of Service: [name] Grapes. 12/26/2022 10:00 A M Medical Record Number: [id number] Patient Account Number: [id number] Date of Birth/Sex: Treating RN: 10-30-1950 (72 y.o. [name] Primary Care Keiry Kowal: [name] Other Clinician: Referring Kunio Cummiskey: Treating Jordani Nunn/Extender: [name] in Treatment: 89 Wound Status Wound Number: 18R Primary Diabetic Wound/Ulcer of the Lower Extremity Etiology: Wound Location: Left, Plantar Metatarsal head first Wound Open Wounding Event: Gradually Appeared Status: Date Acquired: 08/23/2020 Comorbid Glaucoma, Sleep Apnea, Hypertension, Peripheral Arterial Disease, Weeks Of Treatment: 89 History: Peripheral Venous Disease, Type II Diabetes, Gout, Osteoarthritis, Clustered Wound: No Neuropathy Photos Wound Measurements Length: (cm) 1.5 Width: (cm) 1.8 Depth: (cm) 0.2 Area: (cm) 2.121 Volume: (cm) 0.424 % Reduction in Area: 85% % Reduction in Volume: 85% Epithelialization: Small (1-33%) Tunneling: No Undermining: No Wound Description Classification: Grade 2 Wound Margin: Thickened Exudate Amount: Medium Exudate Type: Serosanguineous Exudate Color: red, brown Foul Odor After Cleansing: No Slough/Fibrino Yes Wound Bed Granulation Amount: Large (67-100%) Exposed Structure Granulation Quality: Red Fascia Exposed: No Necrotic Amount: Small (1-33%) Fat Layer (Subcutaneous Tissue) Exposed: Yes Necrotic Quality: Adherent Slough Tendon Exposed: No Muscle Exposed: No Joint  Exposed: No Bone Exposed: No [name] ([id number]) [id number] Page 7 of 9 Periwound Skin Texture Texture Color No Abnormalities Noted: No No Abnormalities Noted: Yes Callus: Yes Temperature / Pain Temperature: No Abnormality Moisture No Abnormalities Noted: No Dry / Scaly: No Maceration: Yes Treatment Notes Wound #18R (Metatarsal head first) Wound Laterality: Plantar, Left Cleanser Soap and Water Discharge Instruction: May shower and wash wound with dial antibacterial soap and water prior to dressing change. Wound Cleanser Discharge Instruction: Cleanse the wound  with wound cleanser prior to applying a clean dressing using gauze sponges, not tissue or cotton balls. Peri-Wound Care Ketoconazole Cream 2% Discharge Instruction: Apply Ketoconazole as directed Topical Gentamicin Discharge Instruction: As directed by physician Mupirocin Ointment Discharge Instruction: Apply Mupirocin (Bactroban) as instructed Primary Dressing Maxorb Extra Ag+ Alginate Dressing, 2x2 (in/in) Discharge Instruction: Apply to wound bed as instructed Secondary Dressing Optifoam Non-Adhesive Dressing, 4x4 in Discharge Instruction: Apply over primary dressing as directed. Woven Gauze Sponges 2x2 in Discharge Instruction: Apply over primary dressing as directed. Zetuvit Absorbent Pad, 4x4 (in/in) Secured With Compression Wrap Urgo K2, (equivalent to a 4 layer) two layer compression system, regular Discharge Instruction: Apply Urgo K2 as directed (alternative to 4 layer compression). Compression Stockings Add-Ons Electronic Signature(s) Signed: 12/26/2022 4:16:02 PM By: [name] Entered By: [name] on 12/26/2022 10:16:03 -------------------------------------------------------------------------------- Wound Assessment Details Patient Name: Date of Service: [name] Grapes. 12/26/2022 10:00 A M Medical Record Number: [id number] Patient Account Number:  [id number] Date of Birth/Sex: Treating RN: 1950-08-20 (72 y.o. [name] Primary Care Tariya Morrissette: [name] Other Clinician: Referring Renad Jenniges: Treating Jassiel Flye/Extender: [name] in Treatment: 40 Wound Status Wound Number: 22 Primary Cyst Etiology: [name], [name] ([id number]) [phone].pdf Page 8 of 9 Etiology: Wound Location: Left, Proximal, Lateral Lower Leg Wound Open Wounding Event: Bump Status: Date Acquired: 06/03/2021 Comorbid Glaucoma, Sleep Apnea, Hypertension, Peripheral Arterial Disease, Weeks Of Treatment: 81 History: Peripheral Venous Disease, Type II Diabetes, Gout, Osteoarthritis, Clustered Wound: Yes Neuropathy Photos Wound Measurements Length: (cm) Width: (cm) Depth: (cm) Clustered Quantity: Area: (cm) Volume: (cm) 2.2 % Reduction in Area: 68.6% 0.3 % Reduction in Volume: 96.1% 0.1 Epithelialization: Large (67-100%) 3 Tunneling: No 0.518 Undermining: No 0.052 Wound Description Classification: Full Thickness With Exposed Support Structures Wound Margin: Fibrotic scar, thickened scar Exudate Amount: Medium Exudate Type: Serous Exudate Color: amber Foul Odor After Cleansing: No Slough/Fibrino Yes Wound Bed Granulation Amount: Large (67-100%) Exposed Structure Granulation Quality: Red Fascia Exposed: No Necrotic Amount: Small (1-33%) Fat Layer (Subcutaneous Tissue) Exposed: Yes Necrotic Quality: Adherent Slough Tendon Exposed: No Muscle Exposed: No Joint Exposed: No Bone Exposed: No Periwound Skin Texture Texture Color No Abnormalities Noted: No No Abnormalities Noted: No Scarring: Yes Hemosiderin Staining: Yes Moisture Temperature / Pain No Abnormalities Noted: No Temperature: No Abnormality Dry / Scaly: No Treatment Notes Wound #22 (Lower Leg) Wound Laterality: Left, Lateral, Proximal Cleanser Soap and Water Discharge Instruction: May shower and wash wound with dial  antibacterial soap and water prior to dressing change. Wound Cleanser Discharge Instruction: Cleanse the wound with wound cleanser prior to applying a clean dressing using gauze sponges, not tissue or cotton balls. Peri-Wound Care Skin Prep Discharge Instruction: Use skin prep as directed Sween Lotion (Moisturizing lotion) Discharge Instruction: Apply moisturizing lotion to the leg Topical Skintegrity Hydrogel 4 (oz) Discharge Instruction: Apply hydrogel as directed [name], [name] ([id number]) [phone].pdf Page 9 of 9 Primary Dressing Endoform 2x2 in Discharge Instruction: Moisten with Hydrogel or saline Secondary Dressing Zetuvit Plus 4x8 in Discharge Instruction: Apply over primary dressing as directed. Secured With Elastic Bandage 4 inch (ACE bandage) Discharge Instruction: Secure with ACE bandage as directed. Compression Wrap Compression Stockings Add-Ons Electronic Signature(s) Signed: 12/26/2022 4:16:02 PM By: [name] Entered By: [name] on 12/26/2022 10:16:28 -------------------------------------------------------------------------------- Vitals Details Patient Name: Date of Service: [name] Grapes. 12/26/2022 10:00 A M Medical Record Number: [id number] Patient Account Number: [id number] Date of Birth/Sex: Treating RN: 04/26/1951 (72 y.o. [name] Primary Care Grainne Knights:  [name] Other Clinician: Referring Breydan Shillingburg: Treating Kennethia Lynes/Extender: [name] in Treatment: 89 Vital Signs Time Taken: 10:12 Temperature (F): 99.2 Height (in): 74 Pulse (bpm): 64 Weight (lbs): 238 Respiratory Rate (breaths/min): 18 Body Mass Index (BMI): 30.6 Blood Pressure (mmHg): 143/73 Reference Range: 80 - 120 mg / dl Electronic Signature(s) Signed: 12/26/2022 4:16:02 PM By: [name] Entered By: [name] on 12/26/2022 10:12:51

## 2022-12-26 NOTE — Progress Notes (Signed)
[name], [name] ([id number]) [id number] Page 1 of 22 Visit Report for 12/26/2022 Chief Complaint Document Details Patient Name: Date of Service: [name], [name] 12/26/2022 10:00 A M Medical Record Number: [id number] Patient Account Number: [id number] Date of Birth/Sex: Treating RN: [birth year] (72 y.o. M) Primary Care Provider: [name] Other Clinician: Referring Provider: Treating Provider/Extender: [name] in Treatment: 89 Information Obtained from: Patient Chief Complaint Left leg and foot ulcers 04/12/2021; patient is here for wounds on his left lower leg and left plantar foot over the first metatarsal head Electronic Signature(s) Signed: 12/26/2022 10:33:15 AM By: [name] MD FACS Entered By: [name] on 12/26/2022 10:33:14 -------------------------------------------------------------------------------- Debridement Details Patient Name: Date of Service: [name]. 12/26/2022 10:00 A M Medical Record Number: [id number] Patient Account Number: [id number] Date of Birth/Sex: Treating RN: Jun 30, 1950 (72 y.o. [name] Primary Care Provider: [name] Other Clinician: Referring Provider: Treating Provider/Extender: [name] in Treatment: 89 Debridement Performed for Assessment: Wound #18R Left,Plantar Metatarsal head first Performed By: Physician [name], MD Debridement Type: Debridement Severity of Tissue Pre Debridement: Fat layer exposed Level of Consciousness (Pre-procedure): Awake and Alert Pre-procedure Verification/Time Out Yes - 10:19 Taken: Start Time: 10:19 Pain Control: Lidocaine 4% T opical Solution Percent of Wound Bed Debrided: 100% T Area Debrided (cm): otal 2.12 Tissue and other material debrided: Non-Viable, Slough, Skin: Epidermis, Slough Level: Skin/Epidermis Debridement Description: Selective/Open Wound Instrument:  Curette Bleeding: Minimum Hemostasis Achieved: Pressure Response to Treatment: Procedure was tolerated well Level of Consciousness (Post- Awake and Alert procedure): Post Debridement Measurements of Total Wound Length: (cm) 1.5 Width: (cm) 1.8 Depth: (cm) 0.2 Volume: (cm) 0.424 Character of Wound/Ulcer Post Debridement: Improved Severity of Tissue Post Debridement: Fat layer exposed [name] ([id number]) [id number] Page 2 of 22 Post Procedure Diagnosis Same as Pre-procedure Notes scribed for Dr. Lady Gary by [name], RN Electronic Signature(s) Signed: 12/26/2022 4:16:02 PM By: [name] Signed: 12/26/2022 4:41:42 PM By: [name] MD FACS Entered By: [name] on 12/26/2022 10:22:10 -------------------------------------------------------------------------------- Debridement Details Patient Name: Date of Service: [name]. 12/26/2022 10:00 A M Medical Record Number: [id number] Patient Account Number: [id number] Date of Birth/Sex: Treating RN: [birth year] (72 y.o. [name] Primary Care Provider: [name] Other Clinician: Referring Provider: Treating Provider/Extender: [name] in Treatment: 89 Debridement Performed for Assessment: Wound #22 Left,Proximal,Lateral Lower Leg Performed By: Physician [name], MD Debridement Type: Debridement Level of Consciousness (Pre-procedure): Awake and Alert Pre-procedure Verification/Time Out Yes - 10:19 Taken: Start Time: 10:19 Pain Control: Lidocaine 4% T opical Solution Percent of Wound Bed Debrided: 100% T Area Debrided (cm): otal 0.52 Tissue and other material debrided: Non-Viable, Slough, Skin: Epidermis, Slough Level: Skin/Epidermis Debridement Description: Selective/Open Wound Instrument: Curette Bleeding: Minimum Hemostasis Achieved: Pressure Response to Treatment: Procedure was tolerated well Level of  Consciousness (Post- Awake and Alert procedure): Post Debridement Measurements of Total Wound Length: (cm) 2.2 Width: (cm) 0.3 Depth: (cm) 0.1 Volume: (cm) 0.052 Character of Wound/Ulcer Post Debridement: Improved Post Procedure Diagnosis Same as Pre-procedure Notes scribed for Dr. Lady Gary by [name], RN Electronic Signature(s) Signed: 12/26/2022 4:16:02 PM By: [name] Signed: 12/26/2022 4:41:42 PM By: [name] MD FACS Entered By: [name] on 12/26/2022 10:23:04 [name] ([id number]) [id number] Page 3 of 22 -------------------------------------------------------------------------------- HPI Details Patient Name: Date of Service: [name] 12/26/2022 10:00 A M Medical Record Number: [id number] Patient Account Number: [id number] Date of Birth/Sex: Treating  RN: 1950-12-07 (72 y.o. M) Primary Care Provider: [name] Other Clinician: Referring Provider: Treating Provider/Extender: [name] in Treatment: 52 History of Present Illness HPI Description: 10/11/17; [name] is a 72 year old man who tells me that in 2015 he slipped down the latter traumatizing his left leg. He developed a wound in the same spot the area that we are currently looking at. He states this closed over for the most part although he always felt it was somewhat unstable. In 2016 he hit the same area with the door of his car had this reopened. He tells me that this is never really closed although sometimes an inflow it remains open on a constant basis. He has not been using any specific dressing to this except for topical antibiotics the nature of which were not really sure. His primary doctor did send him to see Dr. Jacinto Halim of interventional cardiology. He underwent an angiogram on 08/06/17 and he underwent a PTA and directional atherectomy of the lesser distal SFA and popliteal arteries which resulted in brisk  improvement in blood flow. It was noted that he had 2 vessel runoff through the anterior tibial and peroneal. He is also been to see vascular and interventional radiologist. He was not felt to have any significant superficial venous insufficiency. Presumably is not a candidate for any ablation. It was suggested he come here for wound care. The patient is a type II diabetic on insulin. He also has a history of venous insufficiency. ABIs on the left were noncompressible in our clinic 10/21/17; patient we admitted to the clinic last week. 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 We put Iodosorb on him after an aggressive debridement and 3 layer compression. He complained of pain in his ankle and itching with is skin in fact he scratched the area on the medial calf superiorly at the rim of our wraps and he has 2 small open areas in that location today which are new. I changed his primary dressing today to silver collagen. As noted he is already had revascularization and does not have any significant superficial venous insufficiency that would be amenable to ablation 10/28/17; patient admitted to the clinic 2 weeks ago. He has a smaller Wound. Scratch injury from last week revealed. There is large wound over the tibial area. This is smaller. Granulation looks healthy. No need for debridement. 11/04/17; the wound on the left lateral calf looks better. Improved dimensions. Surface of this looks better. We've been maintaining him and Kerlix Coban wraps. He finds this much more comfortable. Silver collagen dressing 11/11/17; left lateral Wound continues to look healthy be making progress. Using a #5 curet I removed removed nonviable skin from the surface of the wound and then necrotic debris from the wound surface. Surface of the wound continues to look healthy. He also has an open area on the left great toenail bed. We've been using topical  antibiotics. 11/19/17; left anterior lateral wound continues to look healthy but it's not closed. He also had a small wound above this on the left leg Initially traumatic wounds in the setting of significant chronic venous insufficiency and stasis dermatitis 11/25/17; left anterior wounds superiorly is closed still a small wound inferiorly. 12/02/17; left anterior tibial area. Arrives today with adherent callus. Post debridement clearly not completely closed. Hydrofera Blue under 3 layer compression. 12/09/17; left anterior tibia. Circumferential eschar however the wound bed looks stable to improved.  We've been using Hydrofera Blue under 3 layer compression 12/17/17; left anterior tibia. Apparently this was felt to be closed however when the wrap was taken off there is a skin tear to reopen wounds in the same area we've been using Hydrofera Blue under 3 layer compression 12/23/17 left anterior tibia. Not close to close this week apparently the [facility] was stuck to this again. Still circumferential eschar requiring debridement. I put a contact layer on this this time under the Hydrofera Blue 12/31/17; left anterior tibia. Wound is better slight amount of hyper-granulation. Using Hydrofera Blue over Adaptic. 01/07/18; left anterior tibia. The wound had some surface eschar however after this was removed he has no open wound.he was already revascularized by Dr. Jacinto Halim when he came to our clinic with atherectomy of the left SFA and popliteal artery. He was also sent to interventional radiology for venous reflux studies. He was not felt to have significant reflux but certainly has chronic venous changes of his skin with hemosiderin deposition around this area. He will definitely need to lubricate his skin and wear compression stocking and I've talked to him about this. READMISSION 05/26/2018 This is a now 71 year old man we cared for with traumatic wounds on his left anterior lower extremity. He had been  previously revascularized during that admission by Dr. Jacinto Halim. Apparently in follow-up Dr. Jacinto Halim noted that he had deterioration in his arterial status. He underwent a stent placement in the distal left SFA on 04/22/2018. 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 He went back to the angiography suite on 04/30/2018 he underwent PTA and balloon angioplasty of the occluded left mid anterior tibial artery, thrombotic occlusion went from 100 to 0% which reconstitutes the posterior tibial artery. He had thrombectomy and aspiration of the peroneal artery. The stent placed in the distal SFA left SFA was still occluded. 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 He has been recuperating at [facility] in [facility] after the most recent hospitalization. He is going home tomorrow. He tells me that 2 weeks ago he traumatized the tip of his left fifth toe. He came in urgently for our review of this. This was a history of before I noted that Dr. Jacinto Halim had already noted dry gangrenous changes of the left fifth toe 06/09/2018; 2-week follow-up. I did contact Dr. Jacinto Halim after his last appointment and he apparently saw 1 of Dr. Verl Dicker colleagues the next day. He does not follow-up with Dr. Jacinto Halim himself until Thursday of this week. 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 Nevertheless there is no evidence of infection no drainage and no pain. He had a new area that this week when we were signing him in today on the left anterior mid tibia area, this is in close proximity to the previous wound we have dealt with in this clinic. 06/23/2018; 2-week follow-up. I did not receive a recent note from Dr. Jacinto Halim to review today. Our  office is trying to obtain this. He is apparently not planning to do further vascular interventions and wondered about compression to try and help with the patient's chronic venous insufficiency. However we are also concerned about the arterial flow. He arrives in clinic today  with a new area on the left third toe. The areas on the calf/anterior tibia are close to closing. The left fifth toe is still mummified using Betadine. -In reviewing things with the patient he has what sounds like claudication with mild to moderate amount of activity. 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 I prescribed Levaquin over the phone while we attempted to arrange a plan of care. However the patient called yesterday to report he had low-grade fever and he came in today acutely. There is been a marked deterioration in the left third toe with spreading cellulitis up into the dorsal left foot. He was referred to the emergency room. Readmission: 06/29/2020 patient presents today for reevaluation here in our clinic he was previously treated by Dr. Leanord Hawking at the latter part of 2019 in 2 the beginning of 2020. Subsequently we have not seen him since that time in the interim he did have evaluation with vein and vascular specialist specifically Dr. Bo Mcclintock who did perform quite extensive work for a left femoral to anterior tibial artery bypass. With that being said in the interim the patient has developed significant CADEN, [name] ([id number]) [id number] Page 4 of 22 lymphedema and has wounds that he tells me have really never healed in regard to the incision site on the left leg. He also has multiple wounds on the feet for various reasons some of which is that he tends to pick at his feet. Fortunately there is no signs of active infection systemically at this time he does have some wounds that are little bit deeper but most are fairly superficial he seems to  have good blood flow and overall everything appears to be healthy I see no bone exposed and no obvious signs of osteomyelitis. I do not know that he necessarily needs a x-ray at this point although that something we could consider depending on how things progress. The patient does have a history of lymphedema, diabetes, this is type II, chronic kidney disease stage III, hypertension, and history of peripheral vascular disease. 07/05/2020; patient admitted last week. Is a patient I remember from 2019 he had a spreading infection involving the left foot and we sent him to the hospital. 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 He subsequently had a left femoral to anterior tibial bypass by Dr.Cain vein and vascular. He also has severe lymphedema with chronic skin changes related to that on the left leg. The most problematic area that was new today was on the left medial great toe. This was apparently a small area last week there was purulent drainage which our intake nurse cultured. Also areas on the left medial foot and heel left lateral foot. He has 2 areas on the left medial calf left lateral calf in the setting of the severe lymphedema. 07/13/2020 on evaluation today patient appears to be doing better in my opinion compared to his last visit. The good news is there is no signs of active infection systemically and locally I do not see any signs of infection either. He did have an x-ray which was negative that is great news he had a culture which showed MRSA but at the same time he is been on the doxycycline which has helped. I do think we may want to extend this for 7 additional days 1/25; patient admitted to the clinic a few weeks ago. 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 We have been putting him under compression his edema control is a lot better but he is severe verricused skin on the left leg. He is really  done quite well he still has an open area on the left medial calf and the left medial first metatarsal head. We have been using silver collagen on the leg silver alginate on the foot 07/27/2020 upon evaluation today patient appears to be doing decently well in regard to his wounds. He still has a lot of dry skin on the left leg. Some of this is starting to peel back and I think he may be able to have them out by removing some that today. Fortunately there is no signs of active infection at this time on the left leg although on the right leg he does appear to have swelling and erythema as well as some mild warmth to touch. This does have been concerned about the possibility of cellulitis although within the differential diagnosis I do think that potentially a DVT has to be at least considered. We need to rule that out before proceeding would just call in the cellulitis. Especially since he is having pain in the posterior aspect of his calf muscle. 2/8; the patient had seen sparingly. He has severe skin changes of chronic lymphedema in the left leg thickened hyperkeratotic verrucous skin. He has an open wound on the medial part of the left first met head left mid tibia. He also has a rim of nonepithelialized skin in the anterior mid tibia. He brought in the AmLactin lotion that was been prescribed although I am not sure under compression and its utility. There concern about cellulitis on the right lower leg the last time he was here. He was put on on antibiotics. His DVT rule out was negative. The right leg looks fine he is using his stocking on this area 08/10/2020 upon evaluation today patient appears to be doing well with regard to his leg currently. He has been tolerating the dressing changes without complication. Fortunately there is no signs of active infection which is great news. Overall very pleased with where things stand. 2/22; the patient still has an area on the medial part of the left first  met his head. This looks better than when I last saw this earlier this month he has a rim of epithelialization but still some surface debris. Mostly everything on the left leg is healed. There is still a vulnerable in the left mid tibia area. 08/30/2020 upon evaluation today patient appears to be doing much better in regard to his wounds on his foot. 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 Nonetheless I think we will address that today I Minna send in a prescription for him in that regard. Overall though there does not appear to be any signs of significant worsening. 09/07/2020 on evaluation today patient's wounds over his left foot appear to be doing excellent. I do not see any signs of infection there is some callus buildup this can require debridement for certain but overall I feel like he is managing quite nicely. He still using the AmLactin cream which has been beneficial for him as well. 3/22; left foot wound is closed. There is no open area here. He is using ammonium lactate lotion to the lower extremities to help exfoliate dry cracked skin. He has  compression stockings from elastic therapy in [place]. The wound on the medial part of his left first met head is healed today. READMISSION 04/12/2021 Mr. Whitecotton is a patient we know fairly well he had a prolonged stay in clinic in 2019 with wounds on his left lateral and left anterior lower extremity in the setting of chronic venous insufficiency. More recently he was here earlier this year with predominantly an area on his left foot first metatarsal head plantar and he says the plantar foot broke down on its not long after we discharged him but he did not come back here. The last few months areas of broken down on his left anterior and again the left lateral lower extremity. The leg itself is very swollen  chronically enlarged a lot of hyperkeratotic dry Berry Q skin in the left lower leg. His edema extends well into the thigh. He was seen by Dr. Randie Heinz. He had ABIs on 03/02/2021 showing an ABI on the right of 1 with a TBI of 0.72 his ABI in the left at 1.09 TBI of 0.99. Monophasic and biphasic waveforms on the right. On the left monophasic waveforms were noted he went on to have an angiogram on 03/27/2021 this showed the aortic aortic and iliac segments were free of flow-limiting stenosis the left common femoral vein to evaluate the left femoral to anterior tibial artery bypass was unobstructed the bypass was patent without any areas of stenosis. We discharged the patient in bilateral juxta lite stockings but very clearly that was not sufficient to control the swelling and maintain skin integrity. He is clearly going to need compression pumps. 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 This is fortunate because he is on his feet for long periods of time. 10/27; patient comes in with our intake nurse reporting copious amount of green drainage from the left anterior mid tibia the left dorsal foot and to a lesser extent the left medial mid tibia. We left the compression wrap on all week for the amount of edema in his left leg is quite a bit better. We use silver alginate as the primary dressing 11/3; edema control is good. Left anterior lower leg left medial lower leg and the plantar first metatarsal head. The left anterior lower leg required debridement. Deep tissue culture I did of this wound showed MRSA I put him on 10 days of doxycycline which she will start today. We have him in compression wraps. He has a security card and [employer] however he is retiring on November 15. We will need to then get him into a better offloading boot for the left foot perhaps a total contact cast 11/10; edema control is quite good. Left anterior and left medial lower leg  wounds in the setting of chronic venous insufficiency and lymphedema. He also has a substantial area over the left plantar first metatarsal head. 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 He has significant hypergranulation on the left plantar foot wound. The patient is a diabetic but he does not have significant PAD 11/17; edema control is quite good. Left anterior and left medial lower leg wounds look better. The really concerning area remains the area on the left plantar first metatarsal head. He has a rim of epithelialization. He has been using a surgical shoe The patient is now retired from  a a [employer] I have gone over with him the need to offload this area aggressively. Starting today with a forefoot off loader but . possibly a total contact cast. 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 Arrives with a very significant odor from I think all of the wounds on the left leg and the left foot. Because of this I did not put a total contact cast on him today but will could still consider this. His wife was having cataract surgery which is the reason he missed the appointment 12/6. I saw this man 5 days ago with a swelling below the popliteal fossa. I thought he actually might have a Baker's cyst however the DVT rule out study that we could arrange right away was negative the technician told me this was not a ruptured Baker's cyst. We attempted to get this aspirated by under ultrasound guidance in interventional radiology however all they did was an ultrasound however it shows an extensive fluid collection 62 x 8 x 9.4 in the left thigh and left calf. The patient states he thinks this started 8 days ago or so but he really is not complaining of any pain, fever or systemic symptoms. He has not  ha [name], [name] ([id number]) [id number] Page 5 of 22 12/20; after some difficulty I managed to get the patient into see Dr. Randie Heinz. Eventually he was taken into the hospital and had a drain put in the fluid collection below his left knee posteriorly extending into the posterior thigh. He still has the drain in place. Culture of this showed moderate staff aureus few Morganella and few Klebsiella he is now on doxycycline and ciprofloxacin as suggested by infectious disease he is on this for a month. The drain will remain in place until it stops draining 12/29; he comes in today with the 1 wound on his left leg and the area on the left plantar first met head significantly smaller. Both look healthy. He still has the drain in the left leg. He says he has to change this daily. Follows up with Dr. Randie Heinz on January 11. 06/29/2021; the wounds that I am following on the left leg and left first met head continued to be quite healthy. However the area where his inferior drain is in place had copious amounts of drainage which was green in color. The wound here is larger. Follows up with Dr. Pascal Lux of vein and vascular his surgeon next week as well as infectious disease. He remains on ciprofloxacin and doxycycline. He is not complaining of excessive pain in either one of the drain areas 1/12; the patient saw vascular surgery and infectious disease. Vascular surgery has left the drain in place as there was still some notable drainage still see him back in 2 weeks. Dr. Dorthula Perfect stop the doxycycline and ciprofloxacin and I do not believe he follows up with them at this point. Culture I did last week showed both doxycycline resistant MRSA and Pseudomonas not sensitive to ciprofloxacin although only in rare titers 1/19; the patient's wound on the left anterior lower leg is just about healed. We have continued healing of the area that was medially on the left leg. Left first plantar metatarsal head  continues to get smaller. The major problem here is his 2 drain sites 1 on the left upper calf and lateral thigh. There is purulent  drainage still from the left lateral thigh. I gave him antibiotics last week but we still have recultured. He has the drain in the area I think this is eventually going to have to come out. I suspect there will be a connecting wound to heal here perhaps with improved VAc 1/26; the patient had his drain removed by vein and vascular on 1/25/. This was a large pocket of fluid in his left thigh that seem to tunnel into his left upper calf. 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 His mention his Penrose drain was removed today. He now has a tunneling wound on his left calf and left thigh. Both of these probe widely towards each other although I cannot really prove that they connect. Both wounds on his lower leg anteriorly are closed and his area over the first metatarsal head on his right foot continues to improve. We are using Hydrofera Blue here. He also saw infectious disease culture of the abscess they noted was polymicrobial with MRSA, Morganella and Klebsiella he was treated with doxycycline and ciprofloxacin for 4 weeks ending on 07/03/2021. They did not recommend any further antibiotics. Notable that while he still had the Penrose drain in place last week he had purulent drainage coming out of the inferior IandD site this grew [facility], MRSA and Pseudomonas but there does not appear to be any active infection in this area today with the drain out and he is not systemically unwell 2/2; with regards to the drain sites the superior one on the thigh actually is closed down the one on the upper left lateral calf measures about 8 and half centimeters which is an improvement seems to be less prominent although still with a lot of drainage. The only remaining wound is over the first metatarsal head on the left foot and this looks to be continuing to  improve with Hydrofera Blue. 2/9; the area on his plantar left foot continues to contract. Callus around the wound edge. The drain sites specifically have not come down in depth. We put the wound VAC on Monday he changed the canister late last night our intake nurse reported a pocket of fluid perhaps caused by our compression wraps 2/16; continued improvement in left foot plantar wound. drainage site in the calf is not improved in terms of depth (wound vac) 2/23; continued improvement in the left foot wound over the first metatarsal head. With regards to the drain sites the area on his thigh laterally is healed however the open area on his calf is small in terms of circumference by still probes in by about 15 cm. Within using the wound VAC. Hydrofera Blue on his foot 08/24/2021: The left first metatarsal head wound continues to improve. The wound bed is healthy with just some surrounding callus. Unfortunately the open drain site on his calf remains open and tunnels at least 15 cm (the extent of a Q-tip). This is despite several weeks of wound VAC treatment. Based on reading back through the notes, there has been really no significant change in the depth of the wound, although the orifice is smaller and the more cranial wound on his thigh has closed. I suspect the tunnel tracks nearly all the way to this location. 08/31/2021: Continued improvement in the left first metatarsal head wound. There has been absolutely no improvement to the long tunnel from his open drain site on his calf. We have tried to get him into see vascular surgery sooner to consider  the possibility of simply filleting the tract open and allowing it to heal from the bottom up, likely with a wound VAC. They have not yet scheduled a sooner appointment than his current mid April 09/14/2021: He was seen by vascular surgery and they took him to the operating room last week. They opened a portion of the tunnel, but did not extend the entire  length of the known open subcutaneous tract. I read Dr. Darcella Cheshire operative note and it is not clear from that documentation why only a portion of the tract was opened. The heaped up granulation tissue was curetted and removed from at least some portion of the tract. They did place a wound VAC and applied an Unna boot to the leg. The ulcer on his left first metatarsal head is smaller today. The bed looks good and there is just a small amount of surrounding callus. 09/21/2021: The ulcer on his left first metatarsal head looks to be stalled. There is some callus surrounding the wound but the wound bed itself does not appear particularly dynamic. The tunnel tract on his lateral left leg seems to be roughly the same length or perhaps slightly smaller but the wound bed appears healthy with good granulation tissue. He opened up a new wound on his medial thigh and the site of a prior surgical incision. He says that he did this unconsciously in his sleep by scratching. 09/28/2021: Unfortunately, the ulcer on his left first metatarsal head has extended underneath the callus toward the dorsum of his foot. The medial thigh wounds are roughly the same. The tunnel on his lateral left leg continues to be problematic; it is longer than we are able to actually probe with a Q-tip. I am still not certain as to why Dr. Randie Heinz did not open this up entirely when he took the patient to the operating room. We will likely be back in the same situation with just a small superficial opening in a long unhealed tract, as the open portion is granulating in nicely. 10/02/2021: The patient was initially scheduled for a nurse visit, but we are also applying a total contact cast today. The plantar foot wound looks clean without significant accumulated callus. We have been applying Prisma silver collagen to the site. 10/05/2021: The patient is here for his first total contact cast change. We have tried using gauze packing strips in the tunnel on  his lateral leg wound, but this does not seem to be working any better than the white VAC foam. The foot ulcer looks about the same with minimal periwound callus. Medial thigh wound is clean with just some overlying eschar. 10/12/2021: The plantar foot wound is stable without any significant accumulation of periwound callus. The surface is viable with good granulation tissue. The medial thigh wounds are much smaller and are epithelializing. On the other hand, he had purulent drainage coming from the tunnel on his lateral leg. He does go back to see Dr. Randie Heinz next week and is planning to ask him why the wound tunnel was not completely opened at the time of his most recent operation. 10/19/2021: The plantar foot wound is markedly improved and has epithelial tissue coming through the surface. The medial thigh wounds are nearly closed with just a tiny open area. He did see Dr. Randie Heinz earlier this week and apparently they did discuss the possibility of opening the sinus tract further and enabling a wound VAC application. Apparently there are some limits as to what Dr. Randie Heinz feels comfortable opening,  presumably in relationship to his bypass graft. I think if we could get the tract open to the level of the popliteal fossa, this would greatly aid in her ability to get this chart closed. That being said, however, today when I probed the tract with a Q-tip, I was not able to insert the entirety of the Q-tip as I have on previous occasions. The tunnel is shorter by about 4 cm. The surface is clean with good granulation tissue and no further episodes of purulent drainage. 10/30/2021: Last week, the patient underwent surgery and had the long tract in his leg opened. There was a rind that was debrided, according to the operative report. His medial thigh ulcers are closed. The plantar foot wound is clean with a good surface and some built up surrounding callus. [name], [name] ([id number])  [id number] Page 6 of 22 11/06/2021: The overall dimensions of the large wound on his lateral leg remain about the same, but there is good granulation tissue present and the tunneling is a little bit shorter. He has a new wound on his anterior tibial surface, in the same location where he had a similar lesion in the past. The plantar foot wound is clean with some buildup surrounding callus. Just toward the medial aspect of his foot, however, there is an area of darkening that once debrided, revealed another opening in the skin surface. 11/13/2021: The anterior tibial surface wound is closed. The plantar foot wound has some surrounding callus buildup. The area of darkening that I debrided last week and revealed an opening in the skin surface has closed again. The tunnel in the large wound on his lateral leg has come in by about 3 cm. There is healthy granulation tissue on the entire wound surface. 11/23/2021: The patient was out of town last week and did wet-to-dry dressings on his large wound. 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 Unfortunately, he picked open the wound on his left medial thigh. He says that it was itching and he just could not stop scratching it until it was open again. The wound on his plantar foot is smaller and has not accumulated a tremendous amount of callus. The lateral leg wound is shallower and the tunnel has also decreased in depth. There is just a little bit of slough accumulation on the surface. 11/30/2021: Another portion of his left medial thigh has been opened up. All of these wounds are fairly superficial with just a little bit of slough and eschar accumulation. The wound on his plantar foot is almost closed with just a bit of eschar and periwound callus accumulation. The lateral leg wound is nearly flush with the surrounding skin and the tunnel is markedly  shallower. 12/07/2021: There is just 1 open area on his left medial thigh. It is clean with just a little bit of perimeter eschar. The wound on his plantar foot continues to contract and just has some eschar and periwound callus accumulation. The lateral leg wound is closing at the more distal aspect and the tunnel is smaller. The surface is nearly flush with the surrounding skin and it has a good bed of granulation tissue. 12/14/2021: The thigh and foot wounds are closed. The lateral leg wound has closed over approximately half of its length. The tunnel continues to contract and the surface is now flush with the surrounding skin. The wound bed has robust granulation tissue. 12/22/2021: The thigh and foot wounds  have reopened. The foot wound has a lot of callus accumulation around and over it. The thigh wound is tiny with just a little bit of slough in the wound bed. The lateral leg wound continues to contract. His vascular surgeon took the wound VAC off earlier in the week and the patient has been doing wet-to-dry dressings. There is a little slough accumulation on the surface. The tunnel is about 3 cm in depth at this point. 12/28/2021: The thigh wound is closed again. The foot wound has some callus that subsequently has peeled back exposing just a small slit of a wound. The lateral leg wound Is down to about half the size that it originally was and the tunnel is down to about half a centimeter in depth. 01/04/2022: The thigh wound remains closed. The foot wound has heavy callus overlying the wound site. Once this was debrided, the wound was found to be closed. The lateral leg wound is smaller again this week and very superficial. No tunnel could be identified. 01/12/2022: The thigh and foot wounds both remain closed. The lateral leg wound is now nearly flush with the skin surface. There is good granulation tissue present with a light layer of slough. 01/19/2022: Due to the way his wrap was placed, the  patient did not change the dressing on his thigh at all and so the foam was saturated and his skin is macerated. There is a light layer of slough on the wound surface. The underlying granulation tissue is robust and healthy-appearing. He has heavy callus buildup at the site of his first metatarsal head wound which is still healed. 02/01/2022: He has been in silver alginate. When he removed the dressing from his thigh wound, however, some leg, superficially reopening a portion of the wound that had healed. In addition, underneath the callus at his left first metatarsal head, there appears to be a blister and the wound appears to be open again. 02/08/2022: The lateral leg wound has contracted substantially. There is eschar and a light layer of slough present. He says that it is starting to pull and is uncomfortable. On inspection, there is some puckering of the scar and the eschar is quite dry; this may account for his symptoms. On his first metatarsal head, the wound is much smaller with just some eschar on the surface. The callus has not reaccumulated. He reports that he had a blister come up on his medial thigh wound at the distal aspect. It popped and there is now an opening in his skin again. Looking back through his library of wound photos, there is what looks like a permanent suture just deep to this location and it may be trying to erode through. We have been using silver alginate on his wounds. 02/15/2022: The lateral leg wound is about half the size it was last week. It is clean with just a little perimeter eschar and light slough. The wound on his first metatarsal head is about the same with heavy callus overlying it. The medial thigh wound is closed again. He does have some skin changes on the top of his foot that looks potentially yeast related. 02/22/2022: The skin on the top of his foot improved with the use of a topical antifungal. The lateral leg wound continues to contract and is again  smaller this week. There is a little bit of slough and eschar on the surface. The first metatarsal head wound is a little bit smaller but has reaccumulated a thick callus over the top.  He decided to try to trim his toenail and ultimately took the entire nail off of his left great toe. 03/02/2022: His lateral leg wound continues to improve, as does the wound on his left great toe. Unfortunately, it appears that somehow his foot got wet and moisture seeped in through the opening causing his skin to lift. There is a large wound now overlying his first metatarsal on both the plantar, medial, and dorsal portion of his foot. There is necrotic tissue and slough present underneath the shaggy macerated skin. 03/08/2022: The lateral leg wound is smaller again today. There is just a light layer of slough and eschar on the surface. The great toe wound is smaller again today. The first metatarsal wound is a little bit smaller today and does not look nearly as necrotic and macerated. There is still slough and nonviable tissue present. 03/15/2022: The lateral leg wound is narrower and just has a little bit of light slough buildup. The first metatarsal wound still has a fair amount of moisture affecting the periwound skin. The great toe wound is healed. 03/22/2022: The lateral leg wound is now isolated to just at the level of his knee. There is some eschar and slough accumulation. The first metatarsal head wound has epithelialized tremendously and is about half the size that it was last week. He still has some maceration on the top of his foot and a fungal odor is present. 03/29/2022: T oday the patient's foot was macerated, suggesting that the cast got wet. The patient has also been picking at his dry skin and has enlarged the wound on his left lateral leg. In the time between having his cast removed and my evaluation, he had picked more dry skin and opened up additional wounds on his Achilles area and dorsal foot. The  plantar first metatarsal head wound, however, is smaller and clean with just macerated callus around the perimeter and light slough on the surface. The lateral leg wound measured a little bit larger but is also fairly clean with eschar and minimal slough. 04/02/2022: The patient had vascular studies done last Friday and so his cast was not applied. He is here today to have that done. 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 04/05/2022: Both wounds are smaller and quite clean. There is just a little biofilm on the lateral leg wound. 10/20; the patient has a wound on the left lateral surgical incision at the level of his lateral knee this looks clean and improved. He is using silver alginate. He also has an area on his left medial foot for which she is using Hydrofera Blue under a total contact cast both wounds are measuring smaller 04/20/2022: The plantar foot wound has contracted considerably and is very close to closing. The lateral leg wound was measured a little larger, but there was a tiny open area that was included in the measurements that was not included last week. He has some eschar around the perimeter but otherwise the wound looks clean. [name], [name] ([id number]) [id number] Page 7 of 22 04/27/2022: The lateral leg wound looks better this week. He says that midweek, he felt it was very dry and began applying hydrogel to the site. I think this was beneficial. The foot wound is nearly closed underneath a thick layer of dry skin and callus. 05/04/2022: The foot wound is healed. He has developed a new small ulcer on his anterior tibial surface about midway up his leg. It has a little slough on the  surface. The lateral leg wound still is fairly dry, but clean with just a little biofilm on the surface. 05/11/2022: The wound on his foot reopened on Wednesday. A large blister formed which then broke open revealing the fat layer underneath. The ulcer on  his anterior tibial surface is a little bit larger this week. The lateral leg wound has much better moisture balance this week. Fortunately, prior to his foot wound reopening, he did get the cast made for his orthotic. 05/15/2022: Already, the left medial foot wound has improved. The tissue is less macerated and the surface is clean. The ulcer on his anterior tibial surface continues to enlarge. This seems likely secondary to accumulated moisture. The lateral leg wound continues to have an improved moisture balance with the use of collagen. 05/25/2022: The medial foot wound continues to contract. It is now substantially smaller with just a little slough on the surface. The anterior tibial surface wound continues to enlarge further. Once again, this seems to be secondary to moisture. The lateral leg wound does not seem to be changing much in size, but the moisture balance is better. 06/01/2022: The anterior tibial wound is closed. The medial foot wound is down to just a very small, couple of millimeters, opening. The lateral leg wound has good moisture balance, but remains unchanged in size. 12/15; the patient's anterior tibial wound has reopened, however the area on his right first metatarsal head is closed. The major wound is actually on the superior part of his surgical wound in the left lateral thigh. Not a completely viable surface under illumination. This may at some point require a debridement I think he is currently using Prisma. As noted the left medial foot wound has closed 06/14/2022: The anterior tibial wound has closed. The lateral leg wound has a better surface but is basically unchanged in size. The left medial foot wound has reopened. It looks as though there was some callus accumulation and moisture got under the callus which caused the tissue to break down again. 06/21/2022: A new wound has opened up just distal to the previous anterior tibial wound. It is small but has hypertrophic  granulation tissue present. The lateral leg wound is a little bit narrower and has a layer of slough on the surface. The left medial foot wound is down to just a pinhole. His custom orthotics should be available next week. 06/28/2022: The wound on his first metatarsal head has healed. He has developed a new small wound on his medial lower leg, in an old scar site. The lateral leg wound continues to contract but continues to accumulate slough, as well. 07/03/2022: Despite wearing his custom orthopedic shoes, he managed to reopen the wound on his first metatarsal head. He says he thinks his foot got wet and then some skin lifted up and he peeled this away. Both of the lower leg wounds are smaller and have some dry eschar on the surface. The lateral leg wound is quite a bit narrower today. 07/12/2022: The medial lower leg wound is closed. The anterior lower leg wound has contracted considerably. The lateral upper leg wound is narrower with a layer of slough on the surface. The first metatarsal head wound is also smaller, but had copious drainage which saturated the foam border dressing and resulted in some periwound tissue maceration. Fortunately there was no breakdown at this site. 07/19/2022: The lower leg shows signs of significant maceration; I think he must be sweating excessively inside his cast. There are several areas  of skin breakdown present. The wound on his foot is smaller and that on his lateral leg is narrower and is shorter by about a centimeter. 07/26/2022: Last week we used a zinc Coflex wrap prior to applying his total contact cast and this has had the effect of keeping his skin from getting macerated this week. The anterior leg wound has epithelialized substantially. The lateral leg wound is significantly smaller with just a bit of slough on the surface. The first metatarsal head wound is also smaller this week. 08/02/2022: The anterior leg wound was closed on arrival, but while he was sitting  in the room, he picked it open again. The lateral leg wound is smaller with just a little slough on the surface and the first metatarsal head wound has contracted further, as well. 08/09/2022: The first metatarsal head wound is covered with callus. Underneath the callus, it is nearly completely closed. The lateral leg wound is smaller again this week. The anterior leg wound looks better, but he has such heavy buildup of old skin, that moisture is getting underneath this, becoming trapped, and causing the underlying skin to get macerated and open up. 08/16/2022: The first metatarsal head wound is closed. The lateral leg wound continues to contract and is quite a bit smaller again this week. There is just a small, superficial opening remaining on his anterior tibial surface. 08/23/2022: The first metatarsal head wound has, by some miracle, remained closed. The lateral leg wound is substantially smaller with multiple areas of epithelialization. The anterior tibial surface wound is also quite a bit smaller and very clean. 08/30/2022: Unfortunately, his first metatarsal head wound opened up again. It happened in the same fashion as it has on prior occasions. Moisture got under dried skin/callus and created a wound when he removed his sock, taking the skin with it. The anterior tibial surface has a thick shell of hyperkeratotic skin. This has been contributing to ongoing repeat wounding events as moisture gets underneath this and causes tissue breakdown. 3/15; patient presents for follow-up. His anterior left leg wound has healed. He still has the wound to the left lateral aspect and left first met head. We have been using silver alginate and endoform to these areas under [employer]. He has no issues or complaints today. He has been taking Augmentin and reports improvement to his symptoms to the left first met head. 09/13/2022: He has accumulated more thick dry skin in sheets on his lower leg. The lateral leg  wound is about the same size and the left first metatarsal head wound is a little bit smaller. There is slough on both surfaces. There is callus buildup around the foot wound. 09/20/2022: The lateral leg wound is a little bit narrower and the left first metatarsal head wound also seems to have contracted slightly. There is slough on both surfaces. He has a little skin breakdown on his anterior tibial surface. 09/27/2022: The lateral leg wound continues to contract and is quite clean. The first metatarsal head wound is also smaller. There is some perimeter callus and slough accumulation on the foot. The anterior tibial surface is closed. 10/04/2022: Both of his wounds are smaller today, particularly the first metatarsal head wound. 10/18/2022: He missed his appointment last week and ended up cutting off his wrap on Saturday. The anterior tibial wound reopened. It is fairly superficial with a little bit of slough on the surface. His lateral leg wound is smaller with some slough and eschar buildup. The first metatarsal head  wound is also smaller with some callus and slough accumulation. 10/25/2022: All wounds are smaller. There is slough and eschar on the lateral leg and slough and callus on the plantar foot wound. The anterior tibial wound is clean and flush with the surrounding skin. No debris accumulation here. 11/01/2022: The wounds are all smaller again this week. There is slough on the lateral leg and some minimal slough and eschar on the anterior tibial wound. There is callus accumulation on the first metatarsal head site, along with slough. There is also a yeasty odor coming from the foot. [name], [name] ([id number]) [id number] Page 8 of 22 11/08/2022: The lateral leg wound is smaller except where he picked some skin while waiting to be seen. There is a little bit of slough on the surface. The anterior tibial wound is closed. The first metatarsal head site has gotten macerated  once again and has a lot of spongy wet tissue and callus around it. No yeast odor today. 11/15/2022: The lateral leg wound continues to contract. There is now a band of epithelium dividing it into 2 areas. Minimal slough accumulation. He managed to pick open a new wound on his medial lower leg. The first metatarsal head site is smaller with some callus accumulation but no tissue maceration. 11/22/2022: The lateral leg wound is smaller again by about a third today. There is a little bit of slough on the surface with some periwound eschar. The new wound that he picked open last week has healed but he picked open 3 new small wounds on his anterior tibial surface, once again due to his picking at his skin. The first metatarsal head wound looks about the same. There has been some callus accumulation and there is more edema present, as he was not put in a compression wrap last week. 11/29/2022: The lateral leg wound is smaller again today. There is a little periwound eschar and some slough present. The wounds on his anterior tibial surface are all closed except for 1 that has a little bit of slough on the open portion with eschar covering it. The first metatarsal head wound measured a little bit smaller today, but mostly looks about the same. Edema control is better. 12/06/2022: The anterior tibial wound is closed. The lateral leg wound is smaller with some slough and eschar accumulation. The plantar foot ulcer is about the same size, but the tissue does show some evidence of the fact that he was on his feet quite a bit more this past week; there was a death in his family. There has been more callus accumulation and the tissues are little bit more purpleish. 12/13/2022: He picked the skin on his anterior tibia and reopened a wound there while he was waiting to be seen in clinic. The lateral leg wound is much smaller with a little bit of slough and eschar. The plantar foot ulcer is smaller today with callus and  slough buildup, but without the pressure induced tissue injury that was seen last week. 12/20/2022: The anterior tibial wound has healed. The lateral leg wound is smaller again this week. There is a little bit of eschar buildup on the surface. The foot had a fairly strong odor coming from it that persisted even after washing. The wound itself looks like its gotten a little smaller but he has built up thick callus, once again. 12/26/2022: The lateral leg wound is down to just a narrow superficial slit. There is slough and a little bit of dry skin present.  The foot is in much better shape today. There is less callus accumulation and no odor. The skin edges are starting to roll inward, however. Electronic Signature(s) Signed: 12/26/2022 10:34:27 AM By: [name] MD FACS Entered By: [name] on 12/26/2022 10:34:27 -------------------------------------------------------------------------------- Physical Exam Details Patient Name: Date of Service: [name]. 12/26/2022 10:00 A M Medical Record Number: [id number] Patient Account Number: [id number] Date of Birth/Sex: Treating RN: 1950-08-29 (72 y.o. M) Primary Care Provider: [name] Other Clinician: Referring Provider: Treating Provider/Extender: [name] in Treatment: 79 Constitutional Slightly hypertensive. . . . no acute distress. Respiratory Normal work of breathing on room air. Notes 12/26/2022: The lateral leg wound is down to just a narrow superficial slit. There is slough and a little bit of dry skin present. The foot is in much better shape today. There is less callus accumulation and no odor. The skin edges are starting to roll inward, however. Electronic Signature(s) Signed: 12/26/2022 10:34:58 AM By: [name] MD FACS Entered By: [name] on 12/26/2022 10:34:57 -------------------------------------------------------------------------------- Physician Orders Details Patient  Name: Date of Service: [name]. 12/26/2022 10:00 A M Medical Record Number: [id number] Patient Account Number: [id number] ADIN, TROUPE ([id number]) [id number] Page 9 of 22 Date of Birth/Sex: Treating RN: [birth year] (72 y.o. [name] Primary Care Provider: Other Clinician: [name] Referring Provider: Treating Provider/Extender: [name] in Treatment: 42 Verbal / Phone Orders: No Diagnosis Coding ICD-10 Coding Code Description 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.828 Non-pressure chronic ulcer of other part of left lower leg with other specified severity I87.322 Chronic venous hypertension (idiopathic) with inflammation of left lower extremity E11.51 Type 2 diabetes mellitus with diabetic peripheral angiopathy without gangrene I89.0 Lymphedema, not elsewhere classified L85.9 Epidermal thickening, unspecified Follow-up Appointments ppointment in 1 week. - Dr Lady Gary - Room 2 Return A Anesthetic (In clinic) Topical Lidocaine 4% applied to wound bed Bathing/ Shower/ Hygiene May shower with protection but do not get wound dressing(s) wet. Protect dressing(s) with water repellant cover (for example, large plastic bag) or a cast cover and may then take shower. Edema Control - Lymphedema / SCD / Other Elevate legs to the level of the heart or above for 30 minutes daily and/or when sitting for 3-4 times a day throughout the day. Avoid standing for long periods of time. Patient to wear own compression stockings every day. - both legs daily Moisturize legs daily. Compression stocking or Garment 20-30 mm/Hg pressure to: Off-Loading Wound #18R Left,Plantar Metatarsal head first Open toe surgical shoe to: - Front off loader Left ft Other: - minimal weight bearing left foot Additional Orders / Instructions Follow Nutritious Diet - vitamin C 500 mg 3 times a  day and zinc 30-50 mg a day Wound Treatment Wound #18R - Metatarsal head first Wound Laterality: Plantar, Left Cleanser: Soap and Water 1 x Per Week/30 Days Discharge Instructions: May shower and wash wound with dial antibacterial soap and water prior to dressing change. Cleanser: Wound Cleanser 1 x Per Week/30 Days Discharge Instructions: Cleanse the wound with wound cleanser prior to applying a clean dressing using gauze sponges, not tissue or cotton balls. Peri-Wound Care: Ketoconazole Cream 2% 1 x Per Week/30 Days Discharge Instructions: Apply Ketoconazole as directed Topical: Gentamicin 1 x Per Week/30 Days Discharge Instructions: As directed by physician Topical: Mupirocin Ointment 1 x Per Week/30 Days Discharge Instructions: Apply Mupirocin (Bactroban) as instructed Prim Dressing:  Maxorb Extra Ag+ Alginate Dressing, 2x2 (in/in) 1 x Per Week/30 Days ary Discharge Instructions: Apply to wound bed as instructed Secondary Dressing: Optifoam Non-Adhesive Dressing, 4x4 in (Generic) 1 x Per Week/30 Days Discharge Instructions: Apply over primary dressing as directed. Secondary Dressing: Woven Gauze Sponges 2x2 in (Generic) 1 x Per Week/30 Days Discharge Instructions: Apply over primary dressing as directed. Secondary Dressing: Zetuvit Absorbent Pad, 4x4 (in/in) 1 x Per Week/30 Days Compression Wrap: Urgo K2, (equivalent to a 4 layer) two layer compression system, regular 1 x Per Week/30 Days Discharge Instructions: Apply Urgo K2 as directed (alternative to 4 layer compression). Wound #22 - Lower Leg Wound Laterality: Left, Lateral, Proximal Cleanser: Soap and Water 3 x Per Week/30 Days DEKODA, FRANSSEN ([id number]) [id number] Page 10 of 22 Discharge Instructions: May shower and wash wound with dial antibacterial soap and water prior to dressing change. Cleanser: Wound Cleanser 3 x Per Week/30 Days Discharge Instructions: Cleanse the wound with wound cleanser  prior to applying a clean dressing using gauze sponges, not tissue or cotton balls. Peri-Wound Care: Skin Prep 3 x Per Week/30 Days Discharge Instructions: Use skin prep as directed Peri-Wound Care: Sween Lotion (Moisturizing lotion) 3 x Per Week/30 Days Discharge Instructions: Apply moisturizing lotion to the leg Topical: Skintegrity Hydrogel 4 (oz) 3 x Per Week/30 Days Discharge Instructions: Apply hydrogel as directed Prim Dressing: Endoform 2x2 in 3 x Per Week/30 Days ary Discharge Instructions: Moisten with Hydrogel or saline Secondary Dressing: Zetuvit Plus 4x8 in 3 x Per Week/30 Days Discharge Instructions: Apply over primary dressing as directed. Secured With: Elastic Bandage 4 inch (ACE bandage) 3 x Per Week/30 Days Discharge Instructions: Secure with ACE bandage as directed. Patient Medications llergies: No Known Drug Allergies A Notifications Medication Indication Start End 12/26/2022 lidocaine DOSE topical 4 % cream - cream topical Electronic Signature(s) Signed: 12/26/2022 4:41:42 PM By: [name] MD FACS Entered By: [name] on 12/26/2022 10:36:19 -------------------------------------------------------------------------------- Problem List Details Patient Name: Date of Service: [name]. 12/26/2022 10:00 A M Medical Record Number: [id number] Patient Account Number: [id number] Date of Birth/Sex: Treating RN: 09-30-1950 (72 y.o. M) Primary Care Provider: [name] Other Clinician: Referring Provider: Treating Provider/Extender: [name] in Treatment: 89 Active Problems ICD-10 Encounter Code Description Active Date MDM Diagnosis L97.528 Non-pressure chronic ulcer of other part of left foot with other specified 08/26/2022 No Yes severity L97.828 Non-pressure chronic ulcer of other part of left lower leg with other specified 04/12/2021 No Yes severity I87.322 Chronic venous hypertension (idiopathic) with inflammation of  left lower 04/12/2021 No Yes extremity E11.51 Type 2 diabetes mellitus with diabetic peripheral angiopathy without gangrene 04/12/2021 No Yes [name], [name] ([id number]) [id number] Page 11 of 22 I89.0 Lymphedema, not elsewhere classified 04/12/2021 No Yes L85.9 Epidermal thickening, unspecified 08/30/2022 No Yes Inactive Problems ICD-10 Code Description Active Date Inactive Date E11.621 Type 2 diabetes mellitus with foot ulcer 04/12/2021 04/12/2021 L97.828 Non-pressure chronic ulcer of other part of left lower leg with other specified severity 06/08/2022 06/08/2022 E11.42 Type 2 diabetes mellitus with diabetic polyneuropathy 04/12/2021 04/12/2021 L02.416 Cutaneous abscess of left lower limb 06/13/2021 06/13/2021 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 07/20/2021 07/20/2021 Resolved Problems Electronic Signature(s) Signed: 12/26/2022 10:32:50 AM By: [name] MD FACS Entered By: [name] on 12/26/2022 10:32:49 -------------------------------------------------------------------------------- Progress Note Details Patient Name: Date of Service: [name]. 12/26/2022 10:00 A M Medical Record Number: [id number] Patient Account Number: [id number] Date of Birth/Sex: Treating  RN: 11-05-1950 (72 y.o. M) Primary Care Provider: [name] Other Clinician: Referring Provider: Treating Provider/Extender: [name] in Treatment: 89 Subjective Chief Complaint Information obtained from Patient Left leg and foot ulcers 04/12/2021; patient is here for wounds on his left lower leg and left plantar foot over the first metatarsal head History of Present Illness (HPI) 10/11/17; Mr. Alvardo is a 72 year old man who tells me that in 2015 he slipped down the latter traumatizing his left leg. He developed a wound in the same spot the area that we are currently looking at. He states this closed over for the  most part although he always felt it was somewhat unstable. In 2016 he hit the same area with the door of his car had this reopened. He tells me that this is never really closed although sometimes an inflow it remains open on a constant basis. He has not been using any specific dressing to this except for topical antibiotics the nature of which were not really sure. His primary doctor did send him to see Dr. Jacinto Halim of interventional cardiology. He underwent an angiogram on 08/06/17 and he underwent a PTA and directional atherectomy of the lesser distal SFA and popliteal arteries which resulted in brisk improvement in blood flow. It was noted that he had 2 vessel runoff through the anterior tibial and peroneal. He is also been to see vascular and interventional radiologist. He was not felt to have any significant superficial venous insufficiency. Presumably is not a candidate for any ablation. It was suggested he come here for wound care. The patient is a type II diabetic on insulin. He also has a history of venous insufficiency. ABIs on the left were noncompressible in our clinic 10/21/17; patient we admitted to the clinic last week. 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 We [name], [name] ([id number]) [id number] Page 12 of 22 put Iodosorb on him after an aggressive debridement and 3 layer compression. He complained of pain in his ankle and itching with is skin in fact he scratched the area on the medial calf superiorly at the rim of our wraps and he has 2 small open areas in that location today which are new. I changed his primary dressing today to silver collagen. As noted he is already had revascularization and does not have any significant superficial venous insufficiency that would be amenable to ablation 10/28/17; patient admitted to the clinic 2 weeks ago. He has a smaller Wound. Scratch injury from  last week revealed. There is large wound over the tibial area. This is smaller. Granulation looks healthy. No need for debridement. 11/04/17; the wound on the left lateral calf looks better. Improved dimensions. Surface of this looks better. We've been maintaining him and Kerlix Coban wraps. He finds this much more comfortable. Silver collagen dressing 11/11/17; left lateral Wound continues to look healthy be making progress. Using a #5 curet I removed removed nonviable skin from the surface of the wound and then necrotic debris from the wound surface. Surface of the wound continues to look healthy. He also has an open area on the left great toenail bed. We've been using topical antibiotics. 11/19/17; left anterior lateral wound continues to look healthy but it's not closed. He also had a small wound above this on the left leg Initially traumatic wounds in the setting of significant chronic venous insufficiency and stasis dermatitis 11/25/17; left anterior wounds superiorly is closed still  a small wound inferiorly. 12/02/17; left anterior tibial area. Arrives today with adherent callus. Post debridement clearly not completely closed. Hydrofera Blue under 3 layer compression. 12/09/17; left anterior tibia. Circumferential eschar however the wound bed looks stable to improved. We've been using Hydrofera Blue under 3 layer compression 12/17/17; left anterior tibia. Apparently this was felt to be closed however when the wrap was taken off there is a skin tear to reopen wounds in the same area we've been using Hydrofera Blue under 3 layer compression 12/23/17 left anterior tibia. Not close to close this week apparently the [facility] was stuck to this again. Still circumferential eschar requiring debridement. I put a contact layer on this this time under the Hydrofera Blue 12/31/17; left anterior tibia. Wound is better slight amount of hyper-granulation. Using Hydrofera Blue over Adaptic. 01/07/18; left  anterior tibia. The wound had some surface eschar however after this was removed he has no open wound.he was already revascularized by Dr. Jacinto Halim when he came to our clinic with atherectomy of the left SFA and popliteal artery. He was also sent to interventional radiology for venous reflux studies. He was not felt to have significant reflux but certainly has chronic venous changes of his skin with hemosiderin deposition around this area. He will definitely need to lubricate his skin and wear compression stocking and I've talked to him about this. READMISSION 05/26/2018 This is a now 72 year old man we cared for with traumatic wounds on his left anterior lower extremity. He had been previously revascularized during that admission by Dr. Jacinto Halim. Apparently in follow-up Dr. Jacinto Halim noted that he had deterioration in his arterial status. He underwent a stent placement in the distal left SFA on 04/22/2018. 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 He went back to the angiography suite on 04/30/2018 he underwent PTA and balloon angioplasty of the occluded left mid anterior tibial artery, thrombotic occlusion went from 100 to 0% which reconstitutes the posterior tibial artery. He had thrombectomy and aspiration of the peroneal artery. The stent placed in the distal SFA left SFA was still occluded. 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 He has been recuperating at [facility] in [facility] after the most recent hospitalization. He is going home tomorrow. He tells me that 2 weeks ago he traumatized the tip of his left fifth toe. He came in urgently for our review of this. This was a history of before I noted that Dr. Jacinto Halim had already  noted dry gangrenous changes of the left fifth toe 06/09/2018; 2-week follow-up. I did contact Dr. Jacinto Halim after his last appointment and he apparently saw 1 of Dr. Verl Dicker colleagues the next day. He does not follow-up with Dr. Jacinto Halim himself until Thursday of this week. 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 Nevertheless there is no evidence of infection no drainage and no pain. He had a new area that this week when we were signing him in today on the left anterior mid tibia area, this is in close proximity to the previous wound we have dealt with in this clinic. 06/23/2018; 2-week follow-up. I did not receive a recent note from Dr. Jacinto Halim to review today. Our office is trying to  obtain this. He is apparently not planning to do further vascular interventions and wondered about compression to try and help with the patient's chronic venous insufficiency. However we are also concerned about the arterial flow. He arrives in clinic today with a new area on the left third toe. The areas on the calf/anterior tibia are close to closing. The left fifth toe is still mummified using Betadine. -In reviewing things with the patient he has what sounds like claudication with mild to moderate amount of activity. 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 I prescribed Levaquin over the phone while we attempted to arrange a plan of care. However the patient called yesterday to report he had low-grade fever and he came in today acutely. There is been a marked deterioration in the left third toe with spreading cellulitis up into the dorsal left foot. He was referred to the emergency room. Readmission: 06/29/2020 patient presents today for reevaluation here in our clinic he was previously treated by Dr. Leanord Hawking at the latter part of 2019 in 2 the beginning of 2020. Subsequently we have not seen him since that time in the interim he did have evaluation with vein and  vascular specialist specifically Dr. Bo Mcclintock who did perform quite extensive work for a left femoral to anterior tibial artery bypass. With that being said in the interim the patient has developed significant lymphedema and has wounds that he tells me have really never healed in regard to the incision site on the left leg. He also has multiple wounds on the feet for various reasons some of which is that he tends to pick at his feet. Fortunately there is no signs of active infection systemically at this time he does have some wounds that are little bit deeper but most are fairly superficial he seems to have good blood flow and overall everything appears to be healthy I see no bone exposed and no obvious signs of osteomyelitis. I do not know that he necessarily needs a x-ray at this point although that something we could consider depending on how things progress. The patient does have a history of lymphedema, diabetes, this is type II, chronic kidney disease stage III, hypertension, and history of peripheral vascular disease. 07/05/2020; patient admitted last week. Is a patient I remember from 2019 he had a spreading infection involving the left foot and we sent him to the hospital. 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 He subsequently had a left femoral to anterior tibial bypass by Dr.Cain vein and vascular. He also has severe lymphedema with chronic skin changes related to that on the left leg. The most problematic area that was new today was on the left medial great toe. This was apparently a small area last week there was purulent drainage which our intake nurse cultured. Also areas on the left medial foot and heel left lateral foot. He has 2 areas on the left medial calf left lateral calf in the setting of the severe lymphedema. 07/13/2020 on evaluation today patient appears to be doing better in my opinion compared to his last visit. The good news is  there is no signs of active infection systemically and locally I do not see any signs of infection either. He did have an x-ray which was negative that is great news he had a culture which showed MRSA but at the same time he is been on the doxycycline which has helped.  I do think we may want to extend this for 7 additional days 1/25; patient admitted to the clinic a few weeks ago. 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 We have been putting him under compression his edema control is a lot better but he is severe verricused skin on the left leg. He is really done quite well he still has an open area on the left medial calf and the left medial first metatarsal head. We have been using silver collagen on the leg silver alginate on the foot 07/27/2020 upon evaluation today patient appears to be doing decently well in regard to his wounds. He still has a lot of dry skin on the left leg. Some of this is starting to peel back and I think he may be able to have them out by removing some that today. Fortunately there is no signs of active infection at this time on the left leg although on the right leg he does appear to have swelling and erythema as well as some mild warmth to touch. This does have been concerned about the possibility of cellulitis although within the differential diagnosis I do think that potentially a DVT has to be at least considered. We need to rule that out before proceeding would just call in the cellulitis. Especially since he is having pain in the posterior aspect of his calf muscle. 2/8; the patient had seen sparingly. He has severe skin changes of chronic lymphedema in the left leg thickened hyperkeratotic verrucous skin. He has an open wound on the medial part of the left first met head left mid tibia. He also has a rim of nonepithelialized skin in the anterior mid tibia. He brought in the AmLactin lotion that was been prescribed although  I am not sure under compression and its utility. DELPHIN, MCCOLLIN ([id number]) [id number] Page 13 of 22 There concern about cellulitis on the right lower leg the last time he was here. He was put on on antibiotics. His DVT rule out was negative. The right leg looks fine he is using his stocking on this area 08/10/2020 upon evaluation today patient appears to be doing well with regard to his leg currently. He has been tolerating the dressing changes without complication. Fortunately there is no signs of active infection which is great news. Overall very pleased with where things stand. 2/22; the patient still has an area on the medial part of the left first met his head. This looks better than when I last saw this earlier this month he has a rim of epithelialization but still some surface debris. Mostly everything on the left leg is healed. There is still a vulnerable in the left mid tibia area. 08/30/2020 upon evaluation today patient appears to be doing much better in regard to his wounds on his foot. 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 Nonetheless I think we will address that today I Minna send in a prescription for him in that regard. Overall though there does not appear to be any signs of significant worsening. 09/07/2020 on evaluation today patient's wounds over his left foot appear to be doing excellent. I do not see any signs of infection there is some callus buildup this can require debridement for certain but overall I feel like he is managing quite nicely. He still using the  AmLactin cream which has been beneficial for him as well. 3/22; left foot wound is closed. There is no open area here. He is using ammonium lactate lotion to the lower extremities to help exfoliate dry cracked skin. He has compression stockings from [facility] in [place]. The wound on the medial part of his left first met head is healed today. READMISSION 04/12/2021 Mr. Flis is a patient we know fairly well he had a prolonged stay in clinic in 2019 with wounds on his left lateral and left anterior lower extremity in the setting of chronic venous insufficiency. More recently he was here earlier this year with predominantly an area on his left foot first metatarsal head plantar and he says the plantar foot broke down on its not long after we discharged him but he did not come back here. The last few months areas of broken down on his left anterior and again the left lateral lower extremity. The leg itself is very swollen chronically enlarged a lot of hyperkeratotic dry Berry Q skin in the left lower leg. His edema extends well into the thigh. He was seen by Dr. Randie Heinz. He had ABIs on 03/02/2021 showing an ABI on the right of 1 with a TBI of 0.72 his ABI in the left at 1.09 TBI of 0.99. Monophasic and biphasic waveforms on the right. On the left monophasic waveforms were noted he went on to have an angiogram on 03/27/2021 this showed the aortic aortic and iliac segments were free of flow-limiting stenosis the left common femoral vein to evaluate the left femoral to anterior tibial artery bypass was unobstructed the bypass was patent without any areas of stenosis. We discharged the patient in bilateral juxta lite stockings but very clearly that was not sufficient to control the swelling and maintain skin integrity. He is clearly going to need compression pumps. 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 This is fortunate because he is on his feet for long periods of time. 10/27; patient comes in with our intake nurse reporting copious amount of green drainage from the left anterior mid tibia the left dorsal foot and to a lesser extent the left medial mid tibia. We left the compression wrap on all week  for the amount of edema in his left leg is quite a bit better. We use silver alginate as the primary dressing 11/3; edema control is good. Left anterior lower leg left medial lower leg and the plantar first metatarsal head. The left anterior lower leg required debridement. Deep tissue culture I did of this wound showed MRSA I put him on 10 days of doxycycline which she will start today. We have him in compression wraps. He has a security card and [employer] however he is retiring on November 15. We will need to then get him into a better offloading boot for the left foot perhaps a total contact cast 11/10; edema control is quite good. Left anterior and left medial lower leg wounds in the setting of chronic venous insufficiency and lymphedema. He also has a substantial area over the left plantar first metatarsal head. 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 He has significant hypergranulation on the left plantar foot wound. The patient is a diabetic but he does not have significant PAD 11/17; edema control is quite good. Left anterior and  left medial lower leg wounds look better. The really concerning area remains the area on the left plantar first metatarsal head. He has a rim of epithelialization. 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 Starting today with a forefoot off loader but . possibly a total contact cast. 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 Arrives with a very significant odor from I think all of the wounds on the left leg and the left foot. Because of this I did not put a total contact cast on him today but will could still consider this. His wife was having  cataract surgery which is the reason he missed the appointment 12/6. I saw this man 5 days ago with a swelling below the popliteal fossa. I thought he actually might have a Baker's cyst however the DVT rule out study that we could arrange right away was negative the technician told me this was not a ruptured Baker's cyst. We attempted to get this aspirated by under ultrasound guidance in interventional radiology however all they did was an ultrasound however it shows an extensive fluid collection 62 x 8 x 9.4 in the left thigh and left calf. The patient states he thinks this started 8 days ago or so but he really is not complaining of any pain, fever or systemic symptoms. He has not ha 12/20; after some difficulty I managed to get the patient into see Dr. Randie Heinz. Eventually he was taken into the hospital and had a drain put in the fluid collection below his left knee posteriorly extending into the posterior thigh. He still has the drain in place. Culture of this showed moderate staff aureus few Morganella and few Klebsiella he is now on doxycycline and ciprofloxacin as suggested by infectious disease he is on this for a month. The drain will remain in place until it stops draining 12/29; he comes in today with the 1 wound on his left leg and the area on the left plantar first met head significantly smaller. Both look healthy. He still has the drain in the left leg. He says he has to change this daily. Follows up with Dr. Randie Heinz on January 11. 06/29/2021; the wounds that I am following on the left leg and left first met head continued to be quite healthy. However the area where his inferior drain is in place had copious amounts of drainage which was green in color. The wound here is larger. Follows up with Dr. Pascal Lux of vein and vascular his surgeon next week as well as infectious disease. He remains on ciprofloxacin and doxycycline. He is not complaining of excessive pain in either one of the drain  areas 1/12; the patient saw vascular surgery and infectious disease. Vascular surgery has left the drain in place as there was still some notable drainage still see him back in 2 weeks. Dr. Dorthula Perfect stop the doxycycline and ciprofloxacin and I do not believe he follows up with them at this point. Culture I did last week showed both doxycycline resistant MRSA and Pseudomonas not sensitive to ciprofloxacin although only in rare titers 1/19; the patient's wound on the left anterior lower leg is just about healed. We have continued healing of the area that was medially on the left  leg. Left first plantar metatarsal head continues to get smaller. The major problem here is his 2 drain sites 1 on the left upper calf and lateral thigh. There is purulent drainage still from the left lateral thigh. I gave him antibiotics last week but we still have recultured. He has the drain in the area I think this is eventually going to have to come out. I suspect there will be a connecting wound to heal here perhaps with improved VAc 1/26; the patient had his drain removed by vein and vascular on 1/25/. This was a large pocket of fluid in his left thigh that seem to tunnel into his left upper calf. 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 His mention his Penrose drain was removed today. He now has a tunneling wound on his left calf and left thigh. Both of these probe widely towards each other although I cannot really prove that they connect. Both wounds on his lower leg anteriorly are closed and his area over the first metatarsal head on his right foot continues to improve. We are using Hydrofera Blue here. [name], [name] ([id number]) [id number] Page 14 of 22 He also saw infectious disease culture of the abscess they noted was polymicrobial with MRSA, Morganella and Klebsiella he was treated with doxycycline and ciprofloxacin for 4 weeks ending on 07/03/2021. They did not  recommend any further antibiotics. Notable that while he still had the Penrose drain in place last week he had purulent drainage coming out of the inferior IandD site this grew [facility], MRSA and Pseudomonas but there does not appear to be any active infection in this area today with the drain out and he is not systemically unwell 2/2; with regards to the drain sites the superior one on the thigh actually is closed down the one on the upper left lateral calf measures about 8 and half centimeters which is an improvement seems to be less prominent although still with a lot of drainage. The only remaining wound is over the first metatarsal head on the left foot and this looks to be continuing to improve with Hydrofera Blue. 2/9; the area on his plantar left foot continues to contract. Callus around the wound edge. The drain sites specifically have not come down in depth. We put the wound VAC on Monday he changed the canister late last night our intake nurse reported a pocket of fluid perhaps caused by our compression wraps 2/16; continued improvement in left foot plantar wound. drainage site in the calf is not improved in terms of depth (wound vac) 2/23; continued improvement in the left foot wound over the first metatarsal head. With regards to the drain sites the area on his thigh laterally is healed however the open area on his calf is small in terms of circumference by still probes in by about 15 cm. Within using the wound VAC. Hydrofera Blue on his foot 08/24/2021: The left first metatarsal head wound continues to improve. The wound bed is healthy with just some surrounding callus. Unfortunately the open drain site on his calf remains open and tunnels at least 15 cm (the extent of a Q-tip). This is despite several weeks of wound VAC treatment. Based on reading back through the notes, there has been really no significant change in the depth of the wound, although the orifice is smaller and the  more cranial wound on his thigh has closed. I suspect the tunnel tracks nearly all the way to this location.  08/31/2021: Continued improvement in the left first metatarsal head wound. There has been absolutely no improvement to the long tunnel from his open drain site on his calf. We have tried to get him into see vascular surgery sooner to consider the possibility of simply filleting the tract open and allowing it to heal from the bottom up, likely with a wound VAC. They have not yet scheduled a sooner appointment than his current mid April 09/14/2021: He was seen by vascular surgery and they took him to the operating room last week. They opened a portion of the tunnel, but did not extend the entire length of the known open subcutaneous tract. I read Dr. Darcella Cheshire operative note and it is not clear from that documentation why only a portion of the tract was opened. The heaped up granulation tissue was curetted and removed from at least some portion of the tract. They did place a wound VAC and applied an Unna boot to the leg. The ulcer on his left first metatarsal head is smaller today. The bed looks good and there is just a small amount of surrounding callus. 09/21/2021: The ulcer on his left first metatarsal head looks to be stalled. There is some callus surrounding the wound but the wound bed itself does not appear particularly dynamic. The tunnel tract on his lateral left leg seems to be roughly the same length or perhaps slightly smaller but the wound bed appears healthy with good granulation tissue. He opened up a new wound on his medial thigh and the site of a prior surgical incision. He says that he did this unconsciously in his sleep by scratching. 09/28/2021: Unfortunately, the ulcer on his left first metatarsal head has extended underneath the callus toward the dorsum of his foot. The medial thigh wounds are roughly the same. The tunnel on his lateral left leg continues to be problematic; it is  longer than we are able to actually probe with a Q-tip. I am still not certain as to why Dr. Randie Heinz did not open this up entirely when he took the patient to the operating room. We will likely be back in the same situation with just a small superficial opening in a long unhealed tract, as the open portion is granulating in nicely. 10/02/2021: The patient was initially scheduled for a nurse visit, but we are also applying a total contact cast today. The plantar foot wound looks clean without significant accumulated callus. We have been applying Prisma silver collagen to the site. 10/05/2021: The patient is here for his first total contact cast change. We have tried using gauze packing strips in the tunnel on his lateral leg wound, but this does not seem to be working any better than the white VAC foam. The foot ulcer looks about the same with minimal periwound callus. Medial thigh wound is clean with just some overlying eschar. 10/12/2021: The plantar foot wound is stable without any significant accumulation of periwound callus. The surface is viable with good granulation tissue. The medial thigh wounds are much smaller and are epithelializing. On the other hand, he had purulent drainage coming from the tunnel on his lateral leg. He does go back to see Dr. Randie Heinz next week and is planning to ask him why the wound tunnel was not completely opened at the time of his most recent operation. 10/19/2021: The plantar foot wound is markedly improved and has epithelial tissue coming through the surface. The medial thigh wounds are nearly closed with just a tiny open area.  He did see Dr. Randie Heinz earlier this week and apparently they did discuss the possibility of opening the sinus tract further and enabling a wound VAC application. Apparently there are some limits as to what Dr. 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 I think if we could get the tract open to the level of the  popliteal fossa, this would greatly aid in her ability to get this chart closed. That being said, however, today when I probed the tract with a Q-tip, I was not able to insert the entirety of the Q-tip as I have on previous occasions. The tunnel is shorter by about 4 cm. The surface is clean with good granulation tissue and no further episodes of purulent drainage. 10/30/2021: Last week, the patient underwent surgery and had the long tract in his leg opened. There was a rind that was debrided, according to the operative report. His medial thigh ulcers are closed. The plantar foot wound is clean with a good surface and some built up surrounding callus. 11/06/2021: The overall dimensions of the large wound on his lateral leg remain about the same, but there is good granulation tissue present and the tunneling is a little bit shorter. He has a new wound on his anterior tibial surface, in the same location where he had a similar lesion in the past. The plantar foot wound is clean with some buildup surrounding callus. Just toward the medial aspect of his foot, however, there is an area of darkening that once debrided, revealed another opening in the skin surface. 11/13/2021: The anterior tibial surface wound is closed. The plantar foot wound has some surrounding callus buildup. The area of darkening that I debrided last week and revealed an opening in the skin surface has closed again. The tunnel in the large wound on his lateral leg has come in by about 3 cm. There is healthy granulation tissue on the entire wound surface. 11/23/2021: The patient was out of town last week and did wet-to-dry dressings on his large wound. 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 Unfortunately, he picked open the wound on his left medial thigh. He says that it was itching and he just could not stop scratching it until it was open again. The wound on his plantar  foot is smaller and has not accumulated a tremendous amount of callus. The lateral leg wound is shallower and the tunnel has also decreased in depth. There is just a little bit of slough accumulation on the surface. 11/30/2021: Another portion of his left medial thigh has been opened up. All of these wounds are fairly superficial with just a little bit of slough and eschar accumulation. The wound on his plantar foot is almost closed with just a bit of eschar and periwound callus accumulation. The lateral leg wound is nearly flush with the surrounding skin and the tunnel is markedly shallower. 12/07/2021: There is just 1 open area on his left medial thigh. It is clean with just a little bit of perimeter eschar. The wound on his plantar foot continues to contract and just has some eschar and periwound callus accumulation. The lateral leg wound is closing at the more distal aspect and the tunnel is smaller. The surface is nearly flush with the surrounding skin and it has a good bed of granulation tissue. 12/14/2021: The thigh and foot wounds are closed. The lateral leg wound has closed over approximately half  of its length. The tunnel continues to contract and the surface is now flush with the surrounding skin. The wound bed has robust granulation tissue. 12/22/2021: The thigh and foot wounds have reopened. The foot wound has a lot of callus accumulation around and over it. The thigh wound is tiny with just a little bit of slough in the wound bed. The lateral leg wound continues to contract. His vascular surgeon took the wound VAC off earlier in the week and the patient has been doing wet-to-dry dressings. There is a little slough accumulation on the surface. The tunnel is about 3 cm in depth at this point. [name], ALIZADEH ([id number]) [id number] Page 15 of 22 12/28/2021: The thigh wound is closed again. The foot wound has some callus that subsequently has peeled back exposing just a  small slit of a wound. The lateral leg wound Is down to about half the size that it originally was and the tunnel is down to about half a centimeter in depth. 01/04/2022: The thigh wound remains closed. The foot wound has heavy callus overlying the wound site. Once this was debrided, the wound was found to be closed. The lateral leg wound is smaller again this week and very superficial. No tunnel could be identified. 01/12/2022: The thigh and foot wounds both remain closed. The lateral leg wound is now nearly flush with the skin surface. There is good granulation tissue present with a light layer of slough. 01/19/2022: Due to the way his wrap was placed, the patient did not change the dressing on his thigh at all and so the foam was saturated and his skin is macerated. There is a light layer of slough on the wound surface. The underlying granulation tissue is robust and healthy-appearing. He has heavy callus buildup at the site of his first metatarsal head wound which is still healed. 02/01/2022: He has been in silver alginate. When he removed the dressing from his thigh wound, however, some leg, superficially reopening a portion of the wound that had healed. In addition, underneath the callus at his left first metatarsal head, there appears to be a blister and the wound appears to be open again. 02/08/2022: The lateral leg wound has contracted substantially. There is eschar and a light layer of slough present. He says that it is starting to pull and is uncomfortable. On inspection, there is some puckering of the scar and the eschar is quite dry; this may account for his symptoms. On his first metatarsal head, the wound is much smaller with just some eschar on the surface. The callus has not reaccumulated. He reports that he had a blister come up on his medial thigh wound at the distal aspect. It popped and there is now an opening in his skin again. Looking back through his library of wound photos, there is  what looks like a permanent suture just deep to this location and it may be trying to erode through. We have been using silver alginate on his wounds. 02/15/2022: The lateral leg wound is about half the size it was last week. It is clean with just a little perimeter eschar and light slough. The wound on his first metatarsal head is about the same with heavy callus overlying it. The medial thigh wound is closed again. He does have some skin changes on the top of his foot that looks potentially yeast related. 02/22/2022: The skin on the top of his foot improved with the use of a topical antifungal. The lateral leg wound  continues to contract and is again smaller this week. There is a little bit of slough and eschar on the surface. The first metatarsal head wound is a little bit smaller but has reaccumulated a thick callus over the top. He decided to try to trim his toenail and ultimately took the entire nail off of his left great toe. 03/02/2022: His lateral leg wound continues to improve, as does the wound on his left great toe. Unfortunately, it appears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

## 2023-01-01 ENCOUNTER — Ambulatory Visit (INDEPENDENT_AMBULATORY_CARE_PROVIDER_SITE_OTHER): Payer: Medicare Other | Admitting: Nurse Practitioner

## 2023-01-01 ENCOUNTER — Other Ambulatory Visit: Payer: Self-pay | Admitting: *Deleted

## 2023-01-01 ENCOUNTER — Encounter: Payer: Self-pay | Admitting: Nurse Practitioner

## 2023-01-01 VITALS — BP 125/67 | HR 62 | Ht 73.0 in | Wt 272.8 lb

## 2023-01-01 DIAGNOSIS — E782 Mixed hyperlipidemia: Secondary | ICD-10-CM

## 2023-01-01 DIAGNOSIS — I10 Essential (primary) hypertension: Secondary | ICD-10-CM

## 2023-01-01 DIAGNOSIS — Z794 Long term (current) use of insulin: Secondary | ICD-10-CM

## 2023-01-01 DIAGNOSIS — E1165 Type 2 diabetes mellitus with hyperglycemia: Secondary | ICD-10-CM

## 2023-01-01 MED ORDER — NOVOLOG 100 UNIT/ML IJ SOLN: INTRAMUSCULAR | 0 refills | Status: DC

## 2023-01-01 MED ORDER — INSULIN ASPART 100 UNIT/ML IJ SOLN: INTRAMUSCULAR | 3 refills | Status: DC
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
## 2023-01-02 ENCOUNTER — Other Ambulatory Visit: Payer: Self-pay | Admitting: Nurse Practitioner

## 2023-01-02 ENCOUNTER — Encounter: Payer: Self-pay | Admitting: Nurse Practitioner

## 2023-01-02 MED ORDER — OMNIPOD 5 DEXG7G6 INTRO GEN 5 KIT: PACK | 0 refills | Status: DC

## 2023-01-02 MED ORDER — OMNIPOD 5 DEXG7G6 PODS GEN 5 MISC: 5 refills | Status: DC

## 2023-01-03 ENCOUNTER — Encounter (HOSPITAL_BASED_OUTPATIENT_CLINIC_OR_DEPARTMENT_OTHER): Payer: Medicare Other | Admitting: General Surgery

## 2023-01-03 DIAGNOSIS — L97528 Non-pressure chronic ulcer of other part of left foot with other specified severity: Secondary | ICD-10-CM | POA: Diagnosis not present

## 2023-01-10 ENCOUNTER — Encounter (HOSPITAL_BASED_OUTPATIENT_CLINIC_OR_DEPARTMENT_OTHER): Payer: Medicare Other | Admitting: General Surgery

## 2023-01-10 ENCOUNTER — Other Ambulatory Visit: Payer: Self-pay

## 2023-01-10 DIAGNOSIS — E1165 Type 2 diabetes mellitus with hyperglycemia: Secondary | ICD-10-CM

## 2023-01-10 DIAGNOSIS — L97528 Non-pressure chronic ulcer of other part of left foot with other specified severity: Secondary | ICD-10-CM | POA: Diagnosis not present

## 2023-01-10 MED ORDER — OMNIPOD 5 DEXG7G6 PODS GEN 5 MISC: 5 refills | Status: DC

## 2023-01-10 MED ORDER — OMNIPOD 5 DEXG7G6 INTRO GEN 5 KIT: PACK | 0 refills | Status: AC

## 2023-01-10 NOTE — Progress Notes (Addendum)
ALCIDES, NUTTING ([id number]) [id number] Page 1 of 20 Visit Report for 01/10/2023 Chief Complaint Document Details Patient Name: Date of Service: [name], [name] 01/10/2023 10:15 A M Medical Record Number: [id number] Patient Account Number: [id number] Date of Birth/Sex: Treating RN: 02-Mar-1951 (72 y.o. M) Primary Care Provider: [name] Other Clinician: Referring Provider: Treating Provider/Extender: [name] in Treatment: 91 Information Obtained from: Patient Chief Complaint Left leg and foot ulcers 04/12/2021; patient is here for wounds on his left lower leg and left plantar foot over the first metatarsal head Electronic Signature(s) Signed: 01/10/2023 11:12:18 AM By: [name] MD FACS Entered By: [name] on 01/10/2023 11:12:18 -------------------------------------------------------------------------------- Debridement Details Patient Name: Date of Service: [name] Grapes. 01/10/2023 10:15 A M Medical Record Number: [id number] Patient Account Number: [id number] Date of Birth/Sex: Treating RN: 1950/11/12 (72 y.o. [name] Primary Care Provider: [name] Other Clinician: Referring Provider: Treating Provider/Extender: [name] in Treatment: 91 Debridement Performed for Assessment: Wound #18R Left,Plantar Metatarsal head first Performed By: Physician [name], MD Debridement Type: Debridement Severity of Tissue Pre Debridement: Fat layer exposed Level of Consciousness (Pre-procedure): Awake and Alert Pre-procedure Verification/Time Out Yes - 10:43 Taken: Start Time: 10:44 Pain Control: Lidocaine 4% Topical Solution Percent of Wound Bed Debrided: 100% T Area Debrided (cm): otal 3.77 Tissue and other material debrided: Non-Viable, Callus, Slough, Skin: Epidermis, Slough Level: Skin/Epidermis Debridement Description: Selective/Open Wound Instrument:  Curette Bleeding: Minimum Hemostasis Achieved: Pressure End Time: 10:50 Procedural Pain: 0 Post Procedural Pain: 0 Response to Treatment: Procedure was tolerated well Level of Consciousness (Post- Awake and Alert procedure): Post Debridement Measurements of Total Wound Length: (cm) 4 Width: (cm) 1.2 Depth: (cm) 0.2 Volume: (cm) 0.754 [name], [name] ([id number]) [id number] Page 2 of 20 Character of Wound/Ulcer Post Debridement: Stable Severity of Tissue Post Debridement: Fat layer exposed Post Procedure Diagnosis Same as Pre-procedure Notes Scribed for Dr Lady Gary by Brenton Grills RN. Electronic Signature(s) Signed: 01/16/2023 5:05:32 PM By: Brenton Grills Signed: 01/17/2023 3:34:47 PM By: [name] MD FACS Previous Signature: 01/10/2023 11:12:07 AM Version By: [name] MD FACS Entered By: Brenton Grills on 01/16/2023 16:57:15 -------------------------------------------------------------------------------- HPI Details Patient Name: Date of Service: [name] Grapes. 01/10/2023 10:15 A M Medical Record Number: [id number] Patient Account Number: [id number] Date of Birth/Sex: Treating RN: 09-08-1950 (72 y.o. M) Primary Care Provider: [name] Other Clinician: Referring Provider: Treating Provider/Extender: [name] in Treatment: 68 History of Present Illness HPI Description: 10/11/17; Mr. Penn is a 72 year old man who tells me that in 2015 he slipped down the latter traumatizing his left leg. He developed a wound in the same spot the area that we are currently looking at. He states this closed over for the most part although he always felt it was somewhat unstable. In 2016 he hit the same area with the door of his car had this reopened. He tells me that this is never really closed although sometimes an inflow it remains open on a constant basis. He has not been using any specific dressing to this except for topical  antibiotics the nature of which were not really sure. His primary doctor did send him to see Dr. Jacinto Halim of interventional cardiology. He underwent an angiogram on 08/06/17 and he underwent a PTA and directional atherectomy of the lesser distal SFA and popliteal arteries which resulted in brisk improvement in blood flow. It was noted that he had 2 vessel runoff through the anterior  tibial and peroneal. He is also been to see vascular and interventional radiologist. He was not felt to have any significant superficial venous insufficiency. Presumably is not a candidate for any ablation. It was suggested he come here for wound care. The patient is a type II diabetic on insulin. He also has a history of venous insufficiency. ABIs on the left were noncompressible in our clinic 10/21/17; patient we admitted to the clinic last week. 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 We put Iodosorb on him after an aggressive debridement and 3 layer compression. He complained of pain in his ankle and itching with is skin in fact he scratched the area on the medial calf superiorly at the rim of our wraps and he has 2 small open areas in that location today which are new. I changed his primary dressing today to silver collagen. As noted he is already had revascularization and does not have any significant superficial venous insufficiency that would be amenable to ablation 10/28/17; patient admitted to the clinic 2 weeks ago. He has a smaller Wound. Scratch injury from last week revealed. There is large wound over the tibial area. This is smaller. Granulation looks healthy. No need for debridement. 11/04/17; the wound on the left lateral calf looks better. Improved dimensions. Surface of this looks better. We've been maintaining him and Kerlix Coban wraps. He finds this much more comfortable. Silver collagen dressing 11/11/17; left lateral Wound continues to  look healthy be making progress. Using a #5 curet I removed removed nonviable skin from the surface of the wound and then necrotic debris from the wound surface. Surface of the wound continues to look healthy. He also has an open area on the left great toenail bed. We've been using topical antibiotics. 11/19/17; left anterior lateral wound continues to look healthy but it's not closed. He also had a small wound above this on the left leg Initially traumatic wounds in the setting of significant chronic venous insufficiency and stasis dermatitis 11/25/17; left anterior wounds superiorly is closed still a small wound inferiorly. 12/02/17; left anterior tibial area. Arrives today with adherent callus. Post debridement clearly not completely closed. Hydrofera Blue under 3 layer compression. 12/09/17; left anterior tibia. Circumferential eschar however the wound bed looks stable to improved. We've been using Hydrofera Blue under 3 layer compression 12/17/17; left anterior tibia. Apparently this was felt to be closed however when the wrap was taken off there is a skin tear to reopen wounds in the same area we've been using Hydrofera Blue under 3 layer compression 12/23/17 left anterior tibia. Not close to close this week apparently the [facility] was stuck to this again. Still circumferential eschar requiring debridement. I put a contact layer on this this time under the Hydrofera Blue 12/31/17; left anterior tibia. Wound is better slight amount of hyper-granulation. Using Hydrofera Blue over Adaptic. 01/07/18; left anterior tibia. The wound had some surface eschar however after this was removed he has no open wound.he was already revascularized by Dr. Jacinto Halim when he came to our clinic with atherectomy of the left SFA and popliteal artery. He was also sent to interventional radiology for venous reflux studies. He was not felt to have significant reflux but certainly has chronic venous changes of his skin with  hemosiderin deposition around this area. He will definitely need to lubricate his skin and wear compression stocking and I've talked to him about this. READMISSION 05/26/2018 This  is a now 72 year old man we cared for with traumatic wounds on his left anterior lower extremity. He had been previously revascularized during that admission by Dr. Jacinto Halim. Apparently in follow-up Dr. Jacinto Halim noted that he had deterioration in his arterial status. He underwent a stent placement in the distal left MARCH, STEYER ([id number]) [id number] Page 3 of 20 SFA on 04/22/2018. 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 He went back to the angiography suite on 04/30/2018 he underwent PTA and balloon angioplasty of the occluded left mid anterior tibial artery, thrombotic occlusion went from 100 to 0% which reconstitutes the posterior tibial artery. He had thrombectomy and aspiration of the peroneal artery. The stent placed in the distal SFA left SFA was still occluded. 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 He has been recuperating at [facility] home in [facility] after the most recent hospitalization. He is going home tomorrow. He tells me that 2 weeks ago he traumatized the tip of his left fifth toe. He came in urgently for our review of this. This was a history of before I noted that Dr. Jacinto Halim had already noted dry gangrenous changes of the left fifth toe 06/09/2018; 2-week follow-up. I did contact Dr. Jacinto Halim after his last appointment and he apparently saw 1 of Dr. Verl Dicker colleagues the next day. He does not follow-up with Dr. Jacinto Halim himself until Thursday of this week. He has dry gangrene on the tip of most of his left  fifth toe. Nevertheless there is no evidence of infection no drainage and no pain. He had a new area that this week when we were signing him in today on the left anterior mid tibia area, this is in close proximity to the previous wound we have dealt with in this clinic. 06/23/2018; 2-week follow-up. I did not receive a recent note from Dr. Jacinto Halim to review today. Our office is trying to obtain this. He is apparently not planning to do further vascular interventions and wondered about compression to try and help with the patient's chronic venous insufficiency. However we are also concerned about the arterial flow. He arrives in clinic today with a new area on the left third toe. The areas on the calf/anterior tibia are close to closing. The left fifth toe is still mummified using Betadine. -In reviewing things with the patient he has what sounds like claudication with mild to moderate amount of activity. 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 I prescribed Levaquin over the phone while we attempted to arrange a plan of care. However the patient called yesterday to report he had low-grade fever and he came in today acutely. There is been a marked deterioration in the left third toe with spreading cellulitis up into the dorsal left foot. He was referred to the emergency room. Readmission: 06/29/2020 patient presents today for reevaluation here in our clinic he was previously treated by Dr. Leanord Hawking at the latter part of 2019 in 2 the beginning of 2020. Subsequently we have not seen him since that time in the interim he did have evaluation with vein and vascular specialist specifically Dr. Bo Mcclintock who did perform quite extensive work for a left femoral to anterior tibial artery bypass. With that  being said in the interim the patient has developed significant lymphedema and has wounds that he tells me have really never healed in regard to the incision site on the left  leg. He also has multiple wounds on the feet for various reasons some of which is that he tends to pick at his feet. Fortunately there is no signs of active infection systemically at this time he does have some wounds that are little bit deeper but most are fairly superficial he seems to have good blood flow and overall everything appears to be healthy I see no bone exposed and no obvious signs of osteomyelitis. I do not know that he necessarily needs a x-ray at this point although that something we could consider depending on how things progress. The patient does have a history of lymphedema, diabetes, this is type II, chronic kidney disease stage III, hypertension, and history of peripheral vascular disease. 07/05/2020; patient admitted last week. Is a patient I remember from 2019 he had a spreading infection involving the left foot and we sent him to the hospital. 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 He subsequently had a left femoral to anterior tibial bypass by Dr.Cain vein and vascular. He also has severe lymphedema with chronic skin changes related to that on the left leg. The most problematic area that was new today was on the left medial great toe. This was apparently a small area last week there was purulent drainage which our intake nurse cultured. Also areas on the left medial foot and heel left lateral foot. He has 2 areas on the left medial calf left lateral calf in the setting of the severe lymphedema. 07/13/2020 on evaluation today patient appears to be doing better in my opinion compared to his last visit. The good news is there is no signs of active infection systemically and locally I do not see any signs of infection either. He did have an x-ray which was negative that is great news he had a culture which showed MRSA but at the same time he is been on the doxycycline which has helped. I do think we may want to extend this for 7 additional  days 1/25; patient admitted to the clinic a few weeks ago. 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 We have been putting him under compression his edema control is a lot better but he is severe verricused skin on the left leg. He is really done quite well he still has an open area on the left medial calf and the left medial first metatarsal head. We have been using silver collagen on the leg silver alginate on the foot 07/27/2020 upon evaluation today patient appears to be doing decently well in regard to his wounds. He still has a lot of dry skin on the left leg. Some of this is starting to peel back and I think he may be able to have them out by removing some that today. Fortunately there is no signs of active infection at this time on the left leg although on the right leg he does appear to have swelling and erythema as well as some mild warmth to touch. This does have been concerned about the possibility of cellulitis although within the differential diagnosis I do think that potentially a DVT has to be at least considered. We need to rule that out before proceeding would just call in the cellulitis. Especially  since he is having pain in the posterior aspect of his calf muscle. 2/8; the patient had seen sparingly. He has severe skin changes of chronic lymphedema in the left leg thickened hyperkeratotic verrucous skin. He has an open wound on the medial part of the left first met head left mid tibia. He also has a rim of nonepithelialized skin in the anterior mid tibia. He brought in the AmLactin lotion that was been prescribed although I am not sure under compression and its utility. There concern about cellulitis on the right lower leg the last time he was here. He was put on on antibiotics. His DVT rule out was negative. The right leg looks fine he is using his stocking on this area 08/10/2020 upon evaluation today patient appears to be doing well with  regard to his leg currently. He has been tolerating the dressing changes without complication. Fortunately there is no signs of active infection which is great news. Overall very pleased with where things stand. 2/22; the patient still has an area on the medial part of the left first met his head. This looks better than when I last saw this earlier this month he has a rim of epithelialization but still some surface debris. Mostly everything on the left leg is healed. There is still a vulnerable in the left mid tibia area. 08/30/2020 upon evaluation today patient appears to be doing much better in regard to his wounds on his foot. 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 Nonetheless I think we will address that today I Minna send in a prescription for him in that regard. Overall though there does not appear to be any signs of significant worsening. 09/07/2020 on evaluation today patient's wounds over his left foot appear to be doing excellent. I do not see any signs of infection there is some callus buildup this can require debridement for certain but overall I feel like he is managing quite nicely. He still using the AmLactin cream which has been beneficial for him as well. 3/22; left foot wound is closed. There is no open area here. He is using ammonium lactate lotion to the lower extremities to help exfoliate dry cracked skin. He has compression stockings from elastic therapy in [place]. The wound on the medial part of his left first met head is healed today. READMISSION 04/12/2021 Mr. Blowe is a patient we know fairly well he had a prolonged stay in clinic in 2019 with wounds on his left lateral and left anterior lower extremity in the setting of chronic venous insufficiency. More recently he was here earlier this year with predominantly an area on his left foot  first metatarsal head plantar and he says the plantar foot broke down on its not long after we discharged him but he did not come back here. The last few months areas of broken down on his left anterior and again the left lateral lower extremity. The leg itself is very swollen chronically enlarged a lot of hyperkeratotic dry Berry Q skin in the left lower leg. His edema extends well into the thigh. He was seen by Dr. Randie Heinz. He had ABIs on 03/02/2021 showing an ABI on the right of 1 with a TBI of 0.72 his ABI in the left at 1.09 TBI of 0.99. Monophasic and biphasic waveforms on the right. On the left monophasic waveforms were noted  he went on to have an angiogram on 03/27/2021 this showed the aortic aortic and iliac segments were free of flow-limiting stenosis the left common femoral vein to evaluate the left femoral to anterior tibial artery bypass was unobstructed the bypass was patent without any areas of stenosis. We discharged the patient in bilateral juxta lite stockings but very clearly that was not sufficient to control the swelling and maintain skin integrity. He is clearly [name], [name] ([id number]) [id number] Page 4 of 20 going to need compression pumps. 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 This is fortunate because he is on his feet for long periods of time. 10/27; patient comes in with our intake nurse reporting copious amount of green drainage from the left anterior mid tibia the left dorsal foot and to a lesser extent the left medial mid tibia. We left the compression wrap on all week for the amount of edema in his left leg is quite a bit better. We use silver alginate as the primary dressing 11/3; edema control is good. Left anterior lower leg left medial lower leg and the plantar first metatarsal head. The left anterior lower leg required debridement. Deep tissue culture I did of this wound showed MRSA  I put him on 10 days of doxycycline which she will start today. We have him in compression wraps. He has a security card and [employer] however he is retiring on November 15. We will need to then get him into a better offloading boot for the left foot perhaps a total contact cast 11/10; edema control is quite good. Left anterior and left medial lower leg wounds in the setting of chronic venous insufficiency and lymphedema. He also has a substantial area over the left plantar first metatarsal head. 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 He has significant hypergranulation on the left plantar foot wound. The patient is a diabetic but he does not have significant PAD 11/17; edema control is quite good. Left anterior and left medial lower leg wounds look better. The really concerning area remains the area on the left plantar first metatarsal head. He has a rim of epithelialization. 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 Starting today with a forefoot off loader but . possibly a total contact cast. 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 Arrives with a very significant odor from I think all of the wounds on the left leg and the left foot. Because of this I did not put a total contact cast on him today but will could still consider this. His wife was having cataract surgery which is the reason he missed the appointment 12/6. I saw this man 5 days ago with a swelling below the popliteal fossa. I thought he actually might have a Baker's cyst however the DVT rule out study that we could arrange right away was negative the technician told me this was not a ruptured Baker's cyst. We attempted to  get this aspirated by under ultrasound guidance in interventional radiology however all they did was an ultrasound however it shows  an extensive fluid collection 62 x 8 x 9.4 in the left thigh and left calf. The patient states he thinks this started 8 days ago or so but he really is not complaining of any pain, fever or systemic symptoms. He has not ha 12/20; after some difficulty I managed to get the patient into see Dr. Randie Heinz. Eventually he was taken into the hospital and had a drain put in the fluid collection below his left knee posteriorly extending into the posterior thigh. He still has the drain in place. Culture of this showed moderate staff aureus few Morganella and few Klebsiella he is now on doxycycline and ciprofloxacin as suggested by infectious disease he is on this for a month. The drain will remain in place until it stops draining 12/29; he comes in today with the 1 wound on his left leg and the area on the left plantar first met head significantly smaller. Both look healthy. He still has the drain in the left leg. He says he has to change this daily. Follows up with Dr. Randie Heinz on January 11. 06/29/2021; the wounds that I am following on the left leg and left first met head continued to be quite healthy. However the area where his inferior drain is in place had copious amounts of drainage which was green in color. The wound here is larger. Follows up with Dr. Pascal Lux of vein and vascular his surgeon next week as well as infectious disease. He remains on ciprofloxacin and doxycycline. He is not complaining of excessive pain in either one of the drain areas 1/12; the patient saw vascular surgery and infectious disease. Vascular surgery has left the drain in place as there was still some notable drainage still see him back in 2 weeks. Dr. Dorthula Perfect stop the doxycycline and ciprofloxacin and I do not believe he follows up with them at this point. Culture I did last week showed both doxycycline  resistant MRSA and Pseudomonas not sensitive to ciprofloxacin although only in rare titers 1/19; the patient's wound on the left anterior lower leg is just about healed. We have continued healing of the area that was medially on the left leg. Left first plantar metatarsal head continues to get smaller. The major problem here is his 2 drain sites 1 on the left upper calf and lateral thigh. There is purulent drainage still from the left lateral thigh. I gave him antibiotics last week but we still have recultured. He has the drain in the area I think this is eventually going to have to come out. I suspect there will be a connecting wound to heal here perhaps with improved VAc 1/26; the patient had his drain removed by vein and vascular on 1/25/. This was a large pocket of fluid in his left thigh that seem to tunnel into his left upper calf. 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 His mention his Penrose drain was removed today. He now has a tunneling wound on his left calf and left thigh. Both of these probe widely towards each other although I cannot really prove that they connect. Both wounds on his lower leg anteriorly are closed and his area over the first metatarsal head on his right foot continues to improve. We are using Hydrofera Blue here. He also saw infectious disease culture of the abscess they noted was polymicrobial with MRSA, Morganella and Klebsiella he was treated with doxycycline and ciprofloxacin for 4 weeks ending on 07/03/2021. They did not recommend any further  antibiotics. Notable that while he still had the Penrose drain in place last week he had purulent drainage coming out of the inferior IandD site this grew [facility], MRSA and Pseudomonas but there does not appear to be any active infection in this area today with the drain out and he is not systemically unwell 2/2; with regards to the drain sites the superior one on the thigh actually is closed down the  one on the upper left lateral calf measures about 8 and half centimeters which is an improvement seems to be less prominent although still with a lot of drainage. The only remaining wound is over the first metatarsal head on the left foot and this looks to be continuing to improve with Hydrofera Blue. 2/9; the area on his plantar left foot continues to contract. Callus around the wound edge. The drain sites specifically have not come down in depth. We put the wound VAC on Monday he changed the canister late last night our intake nurse reported a pocket of fluid perhaps caused by our compression wraps 2/16; continued improvement in left foot plantar wound. drainage site in the calf is not improved in terms of depth (wound vac) 2/23; continued improvement in the left foot wound over the first metatarsal head. With regards to the drain sites the area on his thigh laterally is healed however the open area on his calf is small in terms of circumference by still probes in by about 15 cm. Within using the wound VAC. Hydrofera Blue on his foot 08/24/2021: The left first metatarsal head wound continues to improve. The wound bed is healthy with just some surrounding callus. Unfortunately the open drain site on his calf remains open and tunnels at least 15 cm (the extent of a Q-tip). This is despite several weeks of wound VAC treatment. Based on reading back through the notes, there has been really no significant change in the depth of the wound, although the orifice is smaller and the more cranial wound on his thigh has closed. I suspect the tunnel tracks nearly all the way to this location. 08/31/2021: Continued improvement in the left first metatarsal head wound. There has been absolutely no improvement to the long tunnel from his open drain site on his calf. We have tried to get him into see vascular surgery sooner to consider the possibility of simply filleting the tract open and allowing it to heal from the  bottom up, likely with a wound VAC. They have not yet scheduled a sooner appointment than his current mid April 09/14/2021: He was seen by vascular surgery and they took him to the operating room last week. They opened a portion of the tunnel, but did not extend the entire length of the known open subcutaneous tract. I read Dr. Darcella Cheshire operative note and it is not clear from that documentation why only a portion of the tract was opened. The heaped up granulation tissue was curetted and removed from at least some portion of the tract. They did place a wound VAC and applied an Unna boot to the leg. The ulcer on his left first metatarsal head is smaller today. The bed looks good and there is just a small amount of surrounding callus. 09/21/2021: The ulcer on his left first metatarsal head looks to be stalled. There is some callus surrounding the wound but the wound bed itself does not appear WAQAS, BRUHL ([id number]) [id number] Page 5 of 20 particularly dynamic. The tunnel tract on his lateral left leg  seems to be roughly the same length or perhaps slightly smaller but the wound bed appears healthy with good granulation tissue. He opened up a new wound on his medial thigh and the site of a prior surgical incision. He says that he did this unconsciously in his sleep by scratching. 09/28/2021: Unfortunately, the ulcer on his left first metatarsal head has extended underneath the callus toward the dorsum of his foot. The medial thigh wounds are roughly the same. The tunnel on his lateral left leg continues to be problematic; it is longer than we are able to actually probe with a Q-tip. I am still not certain as to why Dr. Randie Heinz did not open this up entirely when he took the patient to the operating room. We will likely be back in the same situation with just a small superficial opening in a long unhealed tract, as the open portion is granulating in nicely. 10/02/2021: The patient was  initially scheduled for a nurse visit, but we are also applying a total contact cast today. The plantar foot wound looks clean without significant accumulated callus. We have been applying Prisma silver collagen to the site. 10/05/2021: The patient is here for his first total contact cast change. We have tried using gauze packing strips in the tunnel on his lateral leg wound, but this does not seem to be working any better than the white VAC foam. The foot ulcer looks about the same with minimal periwound callus. Medial thigh wound is clean with just some overlying eschar. 10/12/2021: The plantar foot wound is stable without any significant accumulation of periwound callus. The surface is viable with good granulation tissue. The medial thigh wounds are much smaller and are epithelializing. On the other hand, he had purulent drainage coming from the tunnel on his lateral leg. He does go back to see Dr. Randie Heinz next week and is planning to ask him why the wound tunnel was not completely opened at the time of his most recent operation. 10/19/2021: The plantar foot wound is markedly improved and has epithelial tissue coming through the surface. The medial thigh wounds are nearly closed with just a tiny open area. He did see Dr. Randie Heinz earlier this week and apparently they did discuss the possibility of opening the sinus tract further and enabling a wound VAC application. Apparently there are some limits as to what Dr. 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 I think if we could get the tract open to the level of the popliteal fossa, this would greatly aid in her ability to get this chart closed. That being said, however, today when I probed the tract with a Q-tip, I was not able to insert the entirety of the Q-tip as I have on previous occasions. The tunnel is shorter by about 4 cm. The surface is clean with good granulation tissue and no further episodes of purulent  drainage. 10/30/2021: Last week, the patient underwent surgery and had the long tract in his leg opened. There was a rind that was debrided, according to the operative report. His medial thigh ulcers are closed. The plantar foot wound is clean with a good surface and some built up surrounding callus. 11/06/2021: The overall dimensions of the large wound on his lateral leg remain about the same, but there is good granulation tissue present and the tunneling is a little bit shorter. He has a new wound on his anterior tibial surface, in the same location where he had a similar lesion  in the past. The plantar foot wound is clean with some buildup surrounding callus. Just toward the medial aspect of his foot, however, there is an area of darkening that once debrided, revealed another opening in the skin surface. 11/13/2021: The anterior tibial surface wound is closed. The plantar foot wound has some surrounding callus buildup. The area of darkening that I debrided last week and revealed an opening in the skin surface has closed again. The tunnel in the large wound on his lateral leg has come in by about 3 cm. There is healthy granulation tissue on the entire wound surface. 11/23/2021: The patient was out of town last week and did wet-to-dry dressings on his large wound. 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 Unfortunately, he picked open the wound on his left medial thigh. He says that it was itching and he just could not stop scratching it until it was open again. The wound on his plantar foot is smaller and has not accumulated a tremendous amount of callus. The lateral leg wound is shallower and the tunnel has also decreased in depth. There is just a little bit of slough accumulation on the surface. 11/30/2021: Another portion of his left medial thigh has been opened up. All of these wounds are fairly superficial with just a little bit of  slough and eschar accumulation. The wound on his plantar foot is almost closed with just a bit of eschar and periwound callus accumulation. The lateral leg wound is nearly flush with the surrounding skin and the tunnel is markedly shallower. 12/07/2021: There is just 1 open area on his left medial thigh. It is clean with just a little bit of perimeter eschar. The wound on his plantar foot continues to contract and just has some eschar and periwound callus accumulation. The lateral leg wound is closing at the more distal aspect and the tunnel is smaller. The surface is nearly flush with the surrounding skin and it has a good bed of granulation tissue. 12/14/2021: The thigh and foot wounds are closed. The lateral leg wound has closed over approximately half of its length. The tunnel continues to contract and the surface is now flush with the surrounding skin. The wound bed has robust granulation tissue. 12/22/2021: The thigh and foot wounds have reopened. The foot wound has a lot of callus accumulation around and over it. The thigh wound is tiny with just a little bit of slough in the wound bed. The lateral leg wound continues to contract. His vascular surgeon took the wound VAC off earlier in the week and the patient has been doing wet-to-dry dressings. There is a little slough accumulation on the surface. The tunnel is about 3 cm in depth at this point. 12/28/2021: The thigh wound is closed again. The foot wound has some callus that subsequently has peeled back exposing just a small slit of a wound. The lateral leg wound Is down to about half the size that it originally was and the tunnel is down to about half a centimeter in depth. 01/04/2022: The thigh wound remains closed. The foot wound has heavy callus overlying the wound site. Once this was debrided, the wound was found to be closed. The lateral leg wound is smaller again this week and very superficial. No tunnel could be identified. 01/12/2022: The  thigh and foot wounds both remain closed. The lateral leg wound is now nearly flush with the skin surface. There is  good granulation tissue present with a light layer of slough. 01/19/2022: Due to the way his wrap was placed, the patient did not change the dressing on his thigh at all and so the foam was saturated and his skin is macerated. There is a light layer of slough on the wound surface. The underlying granulation tissue is robust and healthy-appearing. He has heavy callus buildup at the site of his first metatarsal head wound which is still healed. 02/01/2022: He has been in silver alginate. When he removed the dressing from his thigh wound, however, some leg, superficially reopening a portion of the wound that had healed. In addition, underneath the callus at his left first metatarsal head, there appears to be a blister and the wound appears to be open again. 02/08/2022: The lateral leg wound has contracted substantially. There is eschar and a light layer of slough present. He says that it is starting to pull and is uncomfortable. On inspection, there is some puckering of the scar and the eschar is quite dry; this may account for his symptoms. On his first metatarsal head, the wound is much smaller with just some eschar on the surface. The callus has not reaccumulated. He reports that he had a blister come up on his medial thigh wound at the distal aspect. It popped and there is now an opening in his skin again. Looking back through his library of wound photos, there is what looks like a permanent suture just deep to this location and it may be trying to erode through. We have been using silver alginate on his wounds. 02/15/2022: The lateral leg wound is about half the size it was last week. It is clean with just a little perimeter eschar and light slough. The wound on his first metatarsal head is about the same with heavy callus overlying it. The medial thigh wound is closed again. He does have  some skin changes on the top of his foot that looks potentially yeast related. 02/22/2022: The skin on the top of his foot improved with the use of a topical antifungal. The lateral leg wound continues to contract and is again smaller this week. There is a little bit of slough and eschar on the surface. The first metatarsal head wound is a little bit smaller but has reaccumulated a thick callus over the top. He decided to try to trim his toenail and ultimately took the entire nail off of his left great toe. 03/02/2022: His lateral leg wound continues to improve, as does the wound on his left great toe. Unfortunately, it appears that somehow his foot got wet and moisture seeped in through the opening causing his skin to lift. There is a large wound now overlying his first metatarsal on both the plantar, medial, and dorsal portion of his foot. There is necrotic tissue and slough present underneath the shaggy macerated skin. NACHUM, DEROSSETT ([id number]) [id number] Page 6 of 20 03/08/2022: The lateral leg wound is smaller again today. There is just a light layer of slough and eschar on the surface. The great toe wound is smaller again today. The first metatarsal wound is a little bit smaller today and does not look nearly as necrotic and macerated. There is still slough and nonviable tissue present. 03/15/2022: The lateral leg wound is narrower and just has a little bit of light slough buildup. The first metatarsal wound still has a fair amount of moisture affecting the periwound skin. The great toe wound is healed. 03/22/2022: The lateral leg  wound is now isolated to just at the level of his knee. There is some eschar and slough accumulation. The first metatarsal head wound has epithelialized tremendously and is about half the size that it was last week. He still has some maceration on the top of his foot and a fungal odor is present. 03/29/2022: T oday the patient's foot was  macerated, suggesting that the cast got wet. The patient has also been picking at his dry skin and has enlarged the wound on his left lateral leg. In the time between having his cast removed and my evaluation, he had picked more dry skin and opened up additional wounds on his Achilles area and dorsal foot. The plantar first metatarsal head wound, however, is smaller and clean with just macerated callus around the perimeter and light slough on the surface. The lateral leg wound measured a little bit larger but is also fairly clean with eschar and minimal slough. 04/02/2022: The patient had vascular studies done last Friday and so his cast was not applied. He is here today to have that done. 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 04/05/2022: Both wounds are smaller and quite clean. There is just a little biofilm on the lateral leg wound. 10/20; the patient has a wound on the left lateral surgical incision at the level of his lateral knee this looks clean and improved. He is using silver alginate. He also has an area on his left medial foot for which she is using Hydrofera Blue under a total contact cast both wounds are measuring smaller 04/20/2022: The plantar foot wound has contracted considerably and is very close to closing. The lateral leg wound was measured a little larger, but there was a tiny open area that was included in the measurements that was not included last week. He has some eschar around the perimeter but otherwise the wound looks clean. 04/27/2022: The lateral leg wound looks better this week. He says that midweek, he felt it was very dry and began applying hydrogel to the site. I think this was beneficial. The foot wound is nearly closed underneath a thick layer of dry skin and callus. 05/04/2022: The foot wound is healed. He has developed a new small ulcer on his anterior tibial surface about midway up his leg. It has a little slough on the surface. The lateral leg wound  still is fairly dry, but clean with just a little biofilm on the surface. 05/11/2022: The wound on his foot reopened on Wednesday. A large blister formed which then broke open revealing the fat layer underneath. The ulcer on his anterior tibial surface is a little bit larger this week. The lateral leg wound has much better moisture balance this week. Fortunately, prior to his foot wound reopening, he did get the cast made for his orthotic. 05/15/2022: Already, the left medial foot wound has improved. The tissue is less macerated and the surface is clean. The ulcer on his anterior tibial surface continues to enlarge. This seems likely secondary to accumulated moisture. The lateral leg wound continues to have an improved moisture balance with the use of collagen. 05/25/2022: The medial foot wound continues to contract. It is now substantially smaller with just a little slough on the surface. The anterior tibial surface wound continues to enlarge further. Once again, this seems to be secondary to moisture. The lateral leg wound does not seem to be changing much in size, but the moisture balance is better. 06/01/2022: The anterior tibial wound is  closed. The medial foot wound is down to just a very small, couple of millimeters, opening. The lateral leg wound has good moisture balance, but remains unchanged in size. 12/15; the patient's anterior tibial wound has reopened, however the area on his right first metatarsal head is closed. The major wound is actually on the superior part of his surgical wound in the left lateral thigh. Not a completely viable surface under illumination. This may at some point require a debridement I think he is currently using Prisma. As noted the left medial foot wound has closed 06/14/2022: The anterior tibial wound has closed. The lateral leg wound has a better surface but is basically unchanged in size. The left medial foot wound has reopened. It looks as though there was  some callus accumulation and moisture got under the callus which caused the tissue to break down again. 06/21/2022: A new wound has opened up just distal to the previous anterior tibial wound. It is small but has hypertrophic granulation tissue present. The lateral leg wound is a little bit narrower and has a layer of slough on the surface. The left medial foot wound is down to just a pinhole. His custom orthotics should be available next week. 06/28/2022: The wound on his first metatarsal head has healed. He has developed a new small wound on his medial lower leg, in an old scar site. The lateral leg wound continues to contract but continues to accumulate slough, as well. 07/03/2022: Despite wearing his custom orthopedic shoes, he managed to reopen the wound on his first metatarsal head. He says he thinks his foot got wet and then some skin lifted up and he peeled this away. Both of the lower leg wounds are smaller and have some dry eschar on the surface. The lateral leg wound is quite a bit narrower today. 07/12/2022: The medial lower leg wound is closed. The anterior lower leg wound has contracted considerably. The lateral upper leg wound is narrower with a layer of slough on the surface. The first metatarsal head wound is also smaller, but had copious drainage which saturated the foam border dressing and resulted in some periwound tissue maceration. Fortunately there was no breakdown at this site. 07/19/2022: The lower leg shows signs of significant maceration; I think he must be sweating excessively inside his cast. There are several areas of skin breakdown present. The wound on his foot is smaller and that on his lateral leg is narrower and is shorter by about a centimeter. 07/26/2022: Last week we used a zinc Coflex wrap prior to applying his total contact cast and this has had the effect of keeping his skin from getting macerated this week. The anterior leg wound has epithelialized substantially. The  lateral leg wound is significantly smaller with just a bit of slough on the surface. The first metatarsal head wound is also smaller this week. 08/02/2022: The anterior leg wound was closed on arrival, but while he was sitting in the room, he picked it open again. The lateral leg wound is smaller with just a little slough on the surface and the first metatarsal head wound has contracted further, as well. 08/09/2022: The first metatarsal head wound is covered with callus. Underneath the callus, it is nearly completely closed. The lateral leg wound is smaller again this week. The anterior leg wound looks better, but he has such heavy buildup of old skin, that moisture is getting underneath this, becoming trapped, and causing the underlying skin to get macerated and open up.  08/16/2022: The first metatarsal head wound is closed. The lateral leg wound continues to contract and is quite a bit smaller again this week. There is just a small, superficial opening remaining on his anterior tibial surface. 08/23/2022: The first metatarsal head wound has, by some miracle, remained closed. The lateral leg wound is substantially smaller with multiple areas of epithelialization. The anterior tibial surface wound is also quite a bit smaller and very clean. [name], [name] ([id number]) [id number] Page 7 of 20 08/30/2022: Unfortunately, his first metatarsal head wound opened up again. It happened in the same fashion as it has on prior occasions. Moisture got under dried skin/callus and created a wound when he removed his sock, taking the skin with it. The anterior tibial surface has a thick shell of hyperkeratotic skin. This has been contributing to ongoing repeat wounding events as moisture gets underneath this and causes tissue breakdown. 3/15; patient presents for follow-up. His anterior left leg wound has healed. He still has the wound to the left lateral aspect and left first met head. We  have been using silver alginate and endoform to these areas under [employer]. He has no issues or complaints today. He has been taking Augmentin and reports improvement to his symptoms to the left first met head. 09/13/2022: He has accumulated more thick dry skin in sheets on his lower leg. The lateral leg wound is about the same size and the left first metatarsal head wound is a little bit smaller. There is slough on both surfaces. There is callus buildup around the foot wound. 09/20/2022: The lateral leg wound is a little bit narrower and the left first metatarsal head wound also seems to have contracted slightly. There is slough on both surfaces. He has a little skin breakdown on his anterior tibial surface. 09/27/2022: The lateral leg wound continues to contract and is quite clean. The first metatarsal head wound is also smaller. There is some perimeter callus and slough accumulation on the foot. The anterior tibial surface is closed. 10/04/2022: Both of his wounds are smaller today, particularly the first metatarsal head wound. 10/18/2022: He missed his appointment last week and ended up cutting off his wrap on Saturday. The anterior tibial wound reopened. It is fairly superficial with a little bit of slough on the surface. His lateral leg wound is smaller with some slough and eschar buildup. The first metatarsal head wound is also smaller with some callus and slough accumulation. 10/25/2022: All wounds are smaller. There is slough and eschar on the lateral leg and slough and callus on the plantar foot wound. The anterior tibial wound is clean and flush with the surrounding skin. No debris accumulation here. 11/01/2022: The wounds are all smaller again this week. There is slough on the lateral leg and some minimal slough and eschar on the anterior tibial wound. There is callus accumulation on the first metatarsal head site, along with slough. There is also a yeasty odor coming from the foot. 11/08/2022:  The lateral leg wound is smaller except where he picked some skin while waiting to be seen. There is a little bit of slough on the surface. The anterior tibial wound is closed. The first metatarsal head site has gotten macerated once again and has a lot of spongy wet tissue and callus around it. No yeast odor today. 11/15/2022: The lateral leg wound continues to contract. There is now a band of epithelium dividing it into 2 areas. Minimal slough accumulation. He managed to pick open a new  wound on his medial lower leg. The first metatarsal head site is smaller with some callus accumulation but no tissue maceration. 11/22/2022: The lateral leg wound is smaller again by about a third today. There is a little bit of slough on the surface with some periwound eschar. The new wound that he picked open last week has healed but he picked open 3 new small wounds on his anterior tibial surface, once again due to his picking at his skin. The first metatarsal head wound looks about the same. There has been some callus accumulation and there is more edema present, as he was not put in a compression wrap last week. 11/29/2022: The lateral leg wound is smaller again today. There is a little periwound eschar and some slough present. The wounds on his anterior tibial surface are all closed except for 1 that has a little bit of slough on the open portion with eschar covering it. The first metatarsal head wound measured a little bit smaller today, but mostly looks about the same. Edema control is better. 12/06/2022: The anterior tibial wound is closed. The lateral leg wound is smaller with some slough and eschar accumulation. The plantar foot ulcer is about the same size, but the tissue does show some evidence of the fact that he was on his feet quite a bit more this past week; there was a death in his family. There has been more callus accumulation and the tissues are little bit more purpleish. 12/13/2022: He picked the skin  on his anterior tibia and reopened a wound there while he was waiting to be seen in clinic. The lateral leg wound is much smaller with a little bit of slough and eschar. The plantar foot ulcer is smaller today with callus and slough buildup, but without the pressure induced tissue injury that was seen last week. 12/20/2022: The anterior tibial wound has healed. The lateral leg wound is smaller again this week. There is a little bit of eschar buildup on the surface. The foot had a fairly strong odor coming from it that persisted even after washing. The wound itself looks like its gotten a little smaller but he has built up thick callus, once again. 12/26/2022: The lateral leg wound is down to just a narrow superficial slit. There is slough and a little bit of dry skin present. The foot is in much better shape today. There is less callus accumulation and no odor. The skin edges are starting to roll inward, however. 01/03/2023: The lateral leg wound is healed. The skin is quite dry but the wound has closed. The foot continues to contract. He has a fair amount of periwound callus accumulation and the surface of the is a little drier than ideal. 01/10/2023: The large lateral leg wound remains closed. He managed to pick off some of his dry skin and has multiple small open superficial wounds on his lower leg. The foot wound measures smaller, but he has a blister immediately adjacent to it. Electronic Signature(s) Signed: 01/10/2023 11:14:06 AM By: [name] MD FACS Entered By: [name] on 01/10/2023 11:14:06 -------------------------------------------------------------------------------- Physical Exam Details Patient Name: Date of Service: [name] Grapes. 01/10/2023 10:15 A M Medical Record Number: [id number] Patient Account Number: [id number] Date of Birth/Sex: Treating RN: 1950/07/28 (72 y.o. M) Primary Care Provider: [name] Other Clinician: Referring Provider: Treating  Provider/Extender: [name] in Treatment: [address] [place] ([id number]) [id number] Page 8 of 20 Constitutional . . . . no acute distress.  Respiratory Normal work of breathing on room air. Notes 01/10/2023: The large lateral leg wound remains closed. He managed to pick off some of his dry skin and has multiple small open superficial wounds on his lower leg. The foot wound measures smaller, but he has a blister immediately adjacent to it. Electronic Signature(s) Signed: 01/10/2023 11:14:44 AM By: [name] MD FACS Entered By: [name] on 01/10/2023 11:14:43 -------------------------------------------------------------------------------- Physician Orders Details Patient Name: Date of Service: [name] Grapes. 01/10/2023 10:15 A M Medical Record Number: [id number] Patient Account Number: [id number] Date of Birth/Sex: Treating RN: 03/27/1951 (72 y.o. M) Primary Care Provider: [name] Other Clinician: Referring Provider: Treating Provider/Extender: [name] in Treatment: 57 Verbal / Phone Orders: No Diagnosis Coding ICD-10 Coding Code Description 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.828 Non-pressure chronic ulcer of other part of left lower leg with other specified severity I87.322 Chronic venous hypertension (idiopathic) with inflammation of left lower extremity E11.51 Type 2 diabetes mellitus with diabetic peripheral angiopathy without gangrene I89.0 Lymphedema, not elsewhere classified L85.9 Epidermal thickening, unspecified Follow-up Appointments ppointment in 1 week. - Dr Lady Gary - Room 2 Return A Insurance was checked for coverage of Aplig[employer] and Epic[employer] and showed a 20% copay. Anesthetic (In clinic) Topical Lidocaine 4% applied to wound bed Bathing/ Shower/ Hygiene May shower with protection but do not get wound  dressing(s) wet. Protect dressing(s) with water repellant cover (for example, large plastic bag) or a cast cover and may then take shower. Edema Control - Lymphedema / SCD / Other Elevate legs to the level of the heart or above for 30 minutes daily and/or when sitting for 3-4 times a day throughout the day. Avoid standing for long periods of time. Patient to wear own compression stockings every day. - both legs daily Moisturize legs daily. Compression stocking or Garment 20-30 mm/Hg pressure to: Off-Loading Wound #18R Left,Plantar Metatarsal head first Open toe surgical shoe to: - Front off loader Left ft Other: - minimal weight bearing left foot Additional Orders / Instructions Follow Nutritious Diet - vitamin C 500 mg 3 times a day and zinc 30-50 mg a day Wound Treatment Wound #18R - Metatarsal head first Wound Laterality: Plantar, Left Cleanser: Soap and Water 1 x Per Week/30 Days [name], [name] ([id number]) [id number] Page 9 of 20 Discharge Instructions: May shower and wash wound with dial antibacterial soap and water prior to dressing change. Cleanser: Wound Cleanser 1 x Per Week/30 Days Discharge Instructions: Cleanse the wound with wound cleanser prior to applying a clean dressing using gauze sponges, not tissue or cotton balls. Peri-Wound Care: Ketoconazole Cream 2% 1 x Per Week/30 Days Discharge Instructions: Apply Ketoconazole as directed Peri-Wound Care: Triamcinolone 15 (g) 1 x Per Week/30 Days Discharge Instructions: Use triamcinolone 15 (g) as directed Topical: Gentamicin 1 x Per Week/30 Days Discharge Instructions: As directed by physician Topical: Mupirocin Ointment 1 x Per Week/30 Days Discharge Instructions: Apply Mupirocin (Bactroban) as instructed Prim Dressing: Promogran Prisma Matrix, 4.34 (sq in) (silver collagen) 1 x Per Week/30 Days ary Discharge Instructions: Moisten collagen with saline or hydrogel Secondary Dressing: Optifoam  Non-Adhesive Dressing, 4x4 in (Generic) 1 x Per Week/30 Days Discharge Instructions: Apply over primary dressing as directed. Secondary Dressing: Woven Gauze Sponges 2x2 in (Generic) 1 x Per Week/30 Days Discharge Instructions: Apply over primary dressing as directed. Secondary Dressing: Zetuvit Absorbent Pad, 4x4 (in/in) 1 x Per Week/30 Days Compression Wrap: Urgo K2, (equivalent  to a 4 layer) two layer compression system, regular 1 x Per Week/30 Days Discharge Instructions: Apply Urgo K2 as directed (alternative to 4 layer compression). Electronic Signature(s) Signed: 01/10/2023 4:23:46 PM By: [name] MD FACS Entered By: [name] on 01/10/2023 11:15:09 -------------------------------------------------------------------------------- Problem List Details Patient Name: Date of Service: [name] Grapes. 01/10/2023 10:15 A M Medical Record Number: [id number] Patient Account Number: [id number] Date of Birth/Sex: Treating RN: August 27, 1950 (72 y.o. M) Primary Care Provider: [name] Other Clinician: Referring Provider: Treating Provider/Extender: [name] in Treatment: 54 Active Problems ICD-10 Encounter Code Description Active Date MDM Diagnosis L97.528 Non-pressure chronic ulcer of other part of left foot with other specified 08/26/2022 No Yes severity L97.828 Non-pressure chronic ulcer of other part of left lower leg with other specified 01/10/2023 No Yes severity I87.322 Chronic venous hypertension (idiopathic) with inflammation of left lower 04/12/2021 No Yes extremity E11.51 Type 2 diabetes mellitus with diabetic peripheral angiopathy without gangrene 04/12/2021 No Yes [name], [name] ([id number]) [id number] Page 10 of 20 I89.0 Lymphedema, not elsewhere classified 04/12/2021 No Yes L85.9 Epidermal thickening, unspecified 08/30/2022 No Yes Inactive Problems ICD-10 Code Description Active Date Inactive Date E11.621  Type 2 diabetes mellitus with foot ulcer 04/12/2021 04/12/2021 E11.42 Type 2 diabetes mellitus with diabetic polyneuropathy 04/12/2021 04/12/2021 L02.416 Cutaneous abscess of left lower limb 06/13/2021 06/13/2021 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 07/20/2021 07/20/2021 Resolved Problems ICD-10 Code Description Active Date Resolved Date L97.828 Non-pressure chronic ulcer of other part of left lower leg with other specified severity 04/12/2021 04/12/2021 Electronic Signature(s) Signed: 01/10/2023 11:11:28 AM By: [name] MD FACS Entered By: [name] on 01/10/2023 11:11:27 -------------------------------------------------------------------------------- Progress Note Details Patient Name: Date of Service: [name] Grapes. 01/10/2023 10:15 A M Medical Record Number: [id number] Patient Account Number: [id number] Date of Birth/Sex: Treating RN: 1950-07-28 (72 y.o. M) Primary Care Provider: [name] Other Clinician: Referring Provider: Treating Provider/Extender: [name] in Treatment: 80 Subjective Chief Complaint Information obtained from Patient Left leg and foot ulcers 04/12/2021; patient is here for wounds on his left lower leg and left plantar foot over the first metatarsal head History of Present Illness (HPI) 10/11/17; Mr. Vandunk is a 72 year old man who tells me that in 2015 he slipped down the latter traumatizing his left leg. He developed a wound in the same spot the area that we are currently looking at. He states this closed over for the most part although he always felt it was somewhat unstable. In 2016 he hit the same area with the door of his car had this reopened. He tells me that this is never really closed although sometimes an inflow it remains open on a constant basis. He has not been using any specific dressing to this except for topical antibiotics the nature of which were not really  sure. His primary doctor did send him to see Dr. Jacinto Halim of interventional cardiology. He underwent an angiogram on 08/06/17 and he underwent a PTA and directional atherectomy of the lesser distal SFA and popliteal arteries which resulted in brisk improvement in blood flow. It was noted that he had 2 vessel runoff through the anterior tibial and peroneal. He is also been to see vascular and interventional radiologist. He was not felt to have any significant superficial venous insufficiency. Presumably is not a candidate for any ablation. It was suggested he come here for wound care. The patient is a type II diabetic on insulin. He  also has a history of venous insufficiency. [name], [name] ([id number]) [id number] Page 11 of 20 ABIs on the left were noncompressible in our clinic 10/21/17; patient we admitted to the clinic last week. 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 We put Iodosorb on him after an aggressive debridement and 3 layer compression. He complained of pain in his ankle and itching with is skin in fact he scratched the area on the medial calf superiorly at the rim of our wraps and he has 2 small open areas in that location today which are new. I changed his primary dressing today to silver collagen. As noted he is already had revascularization and does not have any significant superficial venous insufficiency that would be amenable to ablation 10/28/17; patient admitted to the clinic 2 weeks ago. He has a smaller Wound. Scratch injury from last week revealed. There is large wound over the tibial area. This is smaller. Granulation looks healthy. No need for debridement. 11/04/17; the wound on the left lateral calf looks better. Improved dimensions. Surface of this looks better. We've been maintaining him and Kerlix Coban wraps. He finds this much more comfortable. Silver collagen dressing 11/11/17;  left lateral Wound continues to look healthy be making progress. Using a #5 curet I removed removed nonviable skin from the surface of the wound and then necrotic debris from the wound surface. Surface of the wound continues to look healthy. He also has an open area on the left great toenail bed. We've been using topical antibiotics. 11/19/17; left anterior lateral wound continues to look healthy but it's not closed. He also had a small wound above this on the left leg Initially traumatic wounds in the setting of significant chronic venous insufficiency and stasis dermatitis 11/25/17; left anterior wounds superiorly is closed still a small wound inferiorly. 12/02/17; left anterior tibial area. Arrives today with adherent callus. Post debridement clearly not completely closed. Hydrofera Blue under 3 layer compression. 12/09/17; left anterior tibia. Circumferential eschar however the wound bed looks stable to improved. We've been using Hydrofera Blue under 3 layer compression 12/17/17; left anterior tibia. Apparently this was felt to be closed however when the wrap was taken off there is a skin tear to reopen wounds in the same area we've been using Hydrofera Blue under 3 layer compression 12/23/17 left anterior tibia. Not close to close this week apparently the [facility] was stuck to this again. Still circumferential eschar requiring debridement. I put a contact layer on this this time under the Hydrofera Blue 12/31/17; left anterior tibia. Wound is better slight amount of hyper-granulation. Using Hydrofera Blue over Adaptic. 01/07/18; left anterior tibia. The wound had some surface eschar however after this was removed he has no open wound.he was already revascularized by Dr. Jacinto Halim when he came to our clinic with atherectomy of the left SFA and popliteal artery. He was also sent to interventional radiology for venous reflux studies. He was not felt to have significant reflux but certainly has chronic  venous changes of his skin with hemosiderin deposition around this area. He will definitely need to lubricate his skin and wear compression stocking and I've talked to him about this. READMISSION 05/26/2018 This is a now 72 year old man we cared for with traumatic wounds on his left anterior lower extremity. He had been previously revascularized during that admission by Dr. Jacinto Halim. Apparently in follow-up Dr. Jacinto Halim noted that he had deterioration in his arterial status.  He underwent a stent placement in the distal left SFA on 04/22/2018. 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 He went back to the angiography suite on 04/30/2018 he underwent PTA and balloon angioplasty of the occluded left mid anterior tibial artery, thrombotic occlusion went from 100 to 0% which reconstitutes the posterior tibial artery. He had thrombectomy and aspiration of the peroneal artery. The stent placed in the distal SFA left SFA was still occluded. 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 He has been recuperating at [facility] in [facility] after the most recent hospitalization. He is going home tomorrow. He tells me that 2 weeks ago he traumatized the tip of his left fifth toe. He came in urgently for our review of this. This was a history of before I noted that Dr. Jacinto Halim had already noted dry gangrenous changes of the left fifth toe 06/09/2018; 2-week follow-up. I did contact Dr. Jacinto Halim after his last appointment and he apparently saw 1 of Dr. Verl Dicker colleagues the next day. He does not follow-up with Dr. Jacinto Halim himself until Thursday of this week. 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 Nevertheless there is no evidence  of infection no drainage and no pain. He had a new area that this week when we were signing him in today on the left anterior mid tibia area, this is in close proximity to the previous wound we have dealt with in this clinic. 06/23/2018; 2-week follow-up. I did not receive a recent note from Dr. Jacinto Halim to review today. Our office is trying to obtain this. He is apparently not planning to do further vascular interventions and wondered about compression to try and help with the patient's chronic venous insufficiency. However we are also concerned about the arterial flow. He arrives in clinic today with a new area on the left third toe. The areas on the calf/anterior tibia are close to closing. The left fifth toe is still mummified using Betadine. -In reviewing things with the patient he has what sounds like claudication with mild to moderate amount of activity. 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 I prescribed Levaquin over the phone while we attempted to arrange a plan of care. However the patient called yesterday to report he had low-grade fever and he came in today acutely. There is been a marked deterioration in the left third toe with spreading cellulitis up into the dorsal left foot. He was referred to the emergency room. Readmission: 06/29/2020 patient presents today for reevaluation here in our clinic he was previously treated by Dr. Leanord Hawking at the latter part of 2019 in 2 the beginning of 2020. Subsequently we have not seen him since that time in the interim he did have evaluation with vein and vascular specialist specifically Dr. Bo Mcclintock who did perform quite extensive work for a left femoral to anterior tibial artery bypass. With that being said in the interim the patient has developed significant lymphedema and has wounds that he tells me have really never healed in regard to the incision site on the left leg. He also has multiple wounds on the feet  for various reasons some of which is that he tends  to pick at his feet. Fortunately there is no signs of active infection systemically at this time he does have some wounds that are little bit deeper but most are fairly superficial he seems to have good blood flow and overall everything appears to be healthy I see no bone exposed and no obvious signs of osteomyelitis. I do not know that he necessarily needs a x-ray at this point although that something we could consider depending on how things progress. The patient does have a history of lymphedema, diabetes, this is type II, chronic kidney disease stage III, hypertension, and history of peripheral vascular disease. 07/05/2020; patient admitted last week. Is a patient I remember from 2019 he had a spreading infection involving the left foot and we sent him to the hospital. 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 He subsequently had a left femoral to anterior tibial bypass by Dr.Cain vein and vascular. He also has severe lymphedema with chronic skin changes related to that on the left leg. The most problematic area that was new today was on the left medial great toe. This was apparently a small area last week there was purulent drainage which our intake nurse cultured. Also areas on the left medial foot and heel left lateral foot. He has 2 areas on the left medial calf left lateral calf in the setting of the severe lymphedema. 07/13/2020 on evaluation today patient appears to be doing better in my opinion compared to his last visit. The good news is there is no signs of active infection systemically and locally I do not see any signs of infection either. He did have an x-ray which was negative that is great news he had a culture which showed MRSA but at the same time he is been on the doxycycline which has helped. I do think we may want to extend this for 7 additional days 1/25; patient admitted to the clinic a  few weeks ago. 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 We have been putting him under compression his edema control is a lot better but he is severe verricused skin on the left leg. He is really done quite well he still has an open area on the left medial calf and the left medial first metatarsal head. We have been using silver collagen on the leg silver alginate on the foot 07/27/2020 upon evaluation today patient appears to be doing decently well in regard to his wounds. He still has a lot of dry skin on the left leg. Some of this is starting to peel back and I think he may be able to have them out by removing some that today. Fortunately there is no signs of active infection at this time on the left leg although on the right leg he does appear to have swelling and erythema as well as some mild warmth to touch. This does have been concerned about the possibility of cellulitis although within the differential diagnosis I do think that potentially a DVT has to be at least considered. We need to rule that out before proceeding would just call in the cellulitis. Especially since he is having pain in the posterior aspect of his calf muscle. 2/8; the patient had seen sparingly. He has severe skin changes of chronic lymphedema in the left leg thickened hyperkeratotic verrucous skin. He has an [name], [name] ([id number]) [id number] Page 12 of 20 open wound on the  medial part of the left first met head left mid tibia. He also has a rim of nonepithelialized skin in the anterior mid tibia. He brought in the AmLactin lotion that was been prescribed although I am not sure under compression and its utility. There concern about cellulitis on the right lower leg the last time he was here. He was put on on antibiotics. His DVT rule out was negative. The right leg looks fine he is using his stocking on this area 08/10/2020 upon evaluation today  patient appears to be doing well with regard to his leg currently. He has been tolerating the dressing changes without complication. Fortunately there is no signs of active infection which is great news. Overall very pleased with where things stand. 2/22; the patient still has an area on the medial part of the left first met his head. This looks better than when I last saw this earlier this month he has a rim of epithelialization but still some surface debris. Mostly everything on the left leg is healed. There is still a vulnerable in the left mid tibia area. 08/30/2020 upon evaluation today patient appears to be doing much better in regard to his wounds on his foot. 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 Nonetheless I think we will address that today I Minna send in a prescription for him in that regard. Overall though there does not appear to be any signs of significant worsening. 09/07/2020 on evaluation today patient's wounds over his left foot appear to be doing excellent. I do not see any signs of infection there is some callus buildup this can require debridement for certain but overall I feel like he is managing quite nicely. He still using the AmLactin cream which has been beneficial for him as well. 3/22; left foot wound is closed. There is no open area here. He is using ammonium lactate lotion to the lower extremities to help exfoliate dry cracked skin. He has compression stockings from [employer] in [place]. The wound on the medial part of his left first met head is healed today. READMISSION 04/12/2021 Mr. Harbison is a patient we know fairly well he had a prolonged stay in clinic in 2019 with wounds on his left lateral and left anterior lower extremity in the setting of chronic venous insufficiency. More recently he was here earlier this year with  predominantly an area on his left foot first metatarsal head plantar and he says the plantar foot broke down on its not long after we discharged him but he did not come back here. The last few months areas of broken down on his left anterior and again the left lateral lower extremity. The leg itself is very swollen chronically enlarged a lot of hyperkeratotic dry Berry Q skin in the left lower leg. His edema extends well into the thigh. He was seen by Dr. Randie Heinz. He had ABIs on 03/02/2021 showing an ABI on the right of 1 with a TBI of 0.72 his ABI in the left at 1.09 TBI of 0.99. Monophasic and biphasic waveforms on the right. On the left monophasic waveforms were noted he went on to have an angiogram on 03/27/2021 this showed the aortic aortic and iliac segments were free of flow-limiting stenosis the left common femoral vein to evaluate the left femoral to anterior tibial artery bypass was unobstructed the bypass was  patent without any areas of stenosis. We discharged the patient in bilateral juxta lite stockings but very clearly that was not sufficient to control the swelling and maintain skin integrity. He is clearly going to need compression pumps. 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 This is fortunate because he is on his feet for long periods of time. 10/27; patient comes in with our intake nurse reporting copious amount of green drainage from the left anterior mid tibia the left dorsal foot and to a lesser extent the left medial mid tibia. We left the compression wrap on all week for the amount of edema in his left leg is quite a bit better. We use silver alginate as the primary dressing 11/3; edema control is good. Left anterior lower leg left medial lower leg and the plantar first metatarsal head. The left anterior lower leg required debridement. Deep tissue culture I did of this wound showed MRSA I put him on 10 days of doxycycline which  she will start today. We have him in compression wraps. He has a security card and [employer] however he is retiring on November 15. We will need to then get him into a better offloading boot for the left foot perhaps a total contact cast 11/10; edema control is quite good. Left anterior and left medial lower leg wounds in the setting of chronic venous insufficiency and lymphedema. He also has a substantial area over the left plantar first metatarsal head. 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 He has significant hypergranulation on the left plantar foot wound. The patient is a diabetic but he does not have significant PAD 11/17; edema control is quite good. Left anterior and left medial lower leg wounds look better. The really concerning area remains the area on the left plantar first metatarsal head. He has a rim of epithelialization. 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 Starting today with a forefoot off loader but . possibly a total contact cast. 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 Arrives with a very significant odor from I think all of the wounds on the left leg and the left foot. Because of this I did not put a total contact cast on him today but will could still consider this. His wife was having cataract surgery which is the reason he missed the appointment 12/6. I saw this man 5 days ago with a swelling below the popliteal fossa. I thought he actually might have a Baker's cyst however the DVT rule out study that we could arrange right away was negative the technician told me this was not a ruptured Baker's cyst. We attempted to get this aspirated by under  ultrasound guidance in interventional radiology however all they did was an ultrasound however it shows an extensive fluid collection 62 x 8 x 9.4 in the left thigh and left calf. The patient states he thinks this started 8 days ago or so but he really is not complaining of any pain, fever or systemic symptoms. He has not ha 12/20; after some difficulty I managed  to get the patient into see Dr. Randie Heinz. Eventually he was taken into the hospital and had a drain put in the fluid collection below his left knee posteriorly extending into the posterior thigh. He still has the drain in place. Culture of this showed moderate staff aureus few Morganella and few Klebsiella he is now on doxycycline and ciprofloxacin as suggested by infectious disease he is on this for a month. The drain will remain in place until it stops draining 12/29; he comes in today with the 1 wound on his left leg and the area on the left plantar first met head significantly smaller. Both look healthy. He still has the drain in the left leg. He says he has to change this daily. Follows up with Dr. Randie Heinz on January 11. 06/29/2021; the wounds that I am following on the left leg and left first met head continued to be quite healthy. However the area where his inferior drain is in place had copious amounts of drainage which was green in color. The wound here is larger. Follows up with Dr. Pascal Lux of vein and vascular his surgeon next week as well as infectious disease. He remains on ciprofloxacin and doxycycline. He is not complaining of excessive pain in either one of the drain areas 1/12; the patient saw vascular surgery and infectious disease. Vascular surgery has left the drain in place as there was still some notable drainage still see him back in 2 weeks. Dr. Dorthula Perfect stop the doxycycline and ciprofloxacin and I do not believe he follows up with them at this point. Culture I did last week showed both doxycycline resistant MRSA and Pseudomonas not  sensitive to ciprofloxacin although only in rare titers 1/19; the patient's wound on the left anterior lower leg is just about healed. We have continued healing of the area that was medially on the left leg. Left first plantar metatarsal head continues to get smaller. The major problem here is his 2 drain sites 1 on the left upper calf and lateral thigh. There is purulent drainage still from the left lateral thigh. I gave him antibiotics last week but we still have recultured. He has the drain in the area I think this is eventually going to have to come out. I suspect there will be a connecting wound to heal here perhaps with improved VAc 1/26; the patient had his drain removed by vein and vascular on 1/25/. This was a large pocket of fluid in his left thigh that seem to tunnel into his left upper [name], [name] ([id number]) [id number] Page 13 of 20 calf. 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 His mention his Penrose drain was removed today. He now has a tunneling wound on his left calf and left thigh. Both of these probe widely towards each other although I cannot really prove that they connect. Both wounds on his lower leg anteriorly are closed and his area over the first metatarsal head on his right foot continues to improve. We are using Hydrofera Blue here. He also saw infectious disease culture of the abscess they noted was polymicrobial with MRSA, Morganella and Klebsiella he was treated with doxycycline and ciprofloxacin for 4 weeks ending on 07/03/2021. They did not recommend any further antibiotics. Notable that while he still had the Penrose drain in place last week he had purulent drainage coming out of the inferior IandD site this grew [facility], MRSA and Pseudomonas but there does not appear to be any active infection  in this area today with the drain out and he is not systemically unwell 2/2; with regards to the drain sites the  superior one on the thigh actually is closed down the one on the upper left lateral calf measures about 8 and half centimeters which is an improvement seems to be less prominent although still with a lot of drainage. The only remaining wound is over the first metatarsal head on the left foot and this looks to be continuing to improve with Hydrofera Blue. 2/9; the area on his plantar left foot continues to contract. Callus around the wound edge. The drain sites specifically have not come down in depth. We put the wound VAC on Monday he changed the canister late last night our intake nurse reported a pocket of fluid perhaps caused by our compression wraps 2/16; continued improvement in left foot plantar wound. drainage site in the calf is not improved in terms of depth (wound vac) 2/23; continued improvement in the left foot wound over the first metatarsal head. With regards to the drain sites the area on his thigh laterally is healed however the open area on his calf is small in terms of circumference by still probes in by about 15 cm. Within using the wound VAC. Hydrofera Blue on his foot 08/24/2021: The left first metatarsal head wound continues to improve. The wound bed is healthy with just some surrounding callus. Unfortunately the open drain site on his calf remains open and tunnels at least 15 cm (the extent of a Q-tip). This is despite several weeks of wound VAC treatment. Based on reading back through the notes, there has been really no significant change in the depth of the wound, although the orifice is smaller and the more cranial wound on his thigh has closed. I suspect the tunnel tracks nearly all the way to this location. 08/31/2021: Continued improvement in the left first metatarsal head wound. There has been absolutely no improvement to the long tunnel from his open drain site on his calf. We have tried to get him into see vascular surgery sooner to consider the possibility of simply  filleting the tract open and allowing it to heal from the bottom up, likely with a wound VAC. They have not yet scheduled a sooner appointment than his current mid April 09/14/2021: He was seen by vascular surgery and they took him to the operating room last week. They opened a portion of the tunnel, but did not extend the entire length of the known open subcutaneous tract. I read Dr. Darcella Cheshire operative note and it is not clear from that documentation why only a portion of the tract was opened. The heaped up granulation tissue was curetted and removed from at least some portion of the tract. They did place a wound VAC and applied an Unna boot to the leg. The ulcer on his left first metatarsal head is smaller today. The bed looks good and there is just a small amount of surrounding callus. 09/21/2021: The ulcer on his left first metatarsal head looks to be stalled. There is some callus surrounding the wound but the wound bed itself does not appear particularly dynamic. The tunnel tract on his lateral left leg seems to be roughly the same length or perhaps slightly smaller but the wound bed appears healthy with good granulation tissue. He opened up a new wound on his medial thigh and the site of a prior surgical incision. He says that he did this unconsciously in his sleep by scratching.  09/28/2021: Unfortunately, the ulcer on his left first metatarsal head has extended underneath the callus toward the dorsum of his foot. The medial thigh wounds are roughly the same. The tunnel on his lateral left leg continues to be problematic; it is longer than we are able to actually probe with a Q-tip. I am still not certain as to why Dr. Randie Heinz did not open this up entirely when he took the patient to the operating room. We will likely be back in the same situation with just a small superficial opening in a long unhealed tract, as the open portion is granulating in nicely. 10/02/2021: The patient was initially scheduled for  a nurse visit, but we are also applying a total contact cast today. The plantar foot wound looks clean without significant accumulated callus. We have been applying Prisma silver collagen to the site. 10/05/2021: The patient is here for his first total contact cast change. We have tried using gauze packing strips in the tunnel on his lateral leg wound, but this does not seem to be working any better than the white VAC foam. The foot ulcer looks about the same with minimal periwound callus. Medial thigh wound is clean with just some overlying eschar. 10/12/2021: The plantar foot wound is stable without any significant accumulation of periwound callus. The surface is viable with good granulation tissue. The medial thigh wounds are much smaller and are epithelializing. On the other hand, he had purulent drainage coming from the tunnel on his lateral leg. He does go back to see Dr. Randie Heinz next week and is planning to ask him why the wound tunnel was not completely opened at the time of his most recent operation. 10/19/2021: The plantar foot wound is markedly improved and has epithelial tissue coming through the surface. The medial thigh wounds are nearly closed with just a tiny open area. He did see Dr. Randie Heinz earlier this week and apparently they did discuss the possibility of opening the sinus tract further and enabling a wound VAC application. Apparently there are some limits as to what Dr. 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 I think if we could get the tract open to the level of the popliteal fossa, this would greatly aid in her ability to get this chart closed. That being said, however, today when I probed the tract with a Q-tip, I was not able to insert the entirety of the Q-tip as I have on previous occasions. The tunnel is shorter by about 4 cm. The surface is clean with good granulation tissue and no further episodes of purulent drainage. 10/30/2021: Last week,  the patient underwent surgery and had the long tract in his leg opened. There was a rind that was debrided, according to the operative report. His medial thigh ulcers are closed. The plantar foot wound is clean with a good surface and some built up surrounding callus. 11/06/2021: The overall dimensions of the large wound on his lateral leg remain about the same, but there is good granulation tissue present and the tunneling is a little bit shorter. He has a new wound on his anterior tibial surface, in the same location where he had a similar lesion in the past. The plantar foot wound is clean with some buildup surrounding callus. Just toward the medial aspect of his foot, however, there is an area of darkening that once debrided, revealed another opening in the skin surface. 11/13/2021: The anterior tibial surface wound is closed. The plantar foot wound  has some surrounding callus buildup. The area of darkening that I debrided last week and revealed an opening in the skin surface has closed again. The tunnel in the large wound on his lateral leg has come in by about 3 cm. There is healthy granulation tissue on the entire wound surface. 11/23/2021: The patient was out of town last week and did wet-to-dry dressings on his large wound. 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 Unfortunately, he picked open the wound on his left medial thigh. He says that it was itching and he just could not stop scratching it until it was open again. The wound on his plantar foot is smaller and has not accumulated a tremendous amount of callus. The lateral leg wound is shallower and the tunnel has also decreased in depth. There is just a little bit of slough accumulation on the surface. 11/30/2021: Another portion of his left medial thigh has been opened up. All of these wounds are fairly superficial with just a little bit of slough and eschar accumulation. The  wound on his plantar foot is almost closed with just a bit of eschar and periwound callus accumulation. The lateral leg wound is nearly flush with the surrounding skin and the tunnel is markedly shallower. 12/07/2021: There is just 1 open area on his left medial thigh. It is clean with just a little bit of perimeter eschar. The wound on his plantar foot continues to contract and just has some eschar and periwound callus accumulation. The lateral leg wound is closing at the more distal aspect and the tunnel is smaller. The surface is nearly flush with the surrounding skin and it has a good bed of granulation tissue. 12/14/2021: The thigh and foot wounds are closed. The lateral leg wound has closed over approximately half of its length. The tunnel continues to contract and the surface is now flush with the surrounding skin. The wound bed has robust granulation tissue. 12/22/2021: The thigh and foot wounds have reopened. The foot wound has a lot of callus accumulation around and over it. The thigh wound is tiny with just a COTTRELL, GENTLES ([id number]) [id number] Page 14 of 20 little bit of slough in the wound bed. The lateral leg wound continues to contract. His vascular surgeon took the wound VAC off earlier in the week and the patient has been doing wet-to-dry dressings. There is a little slough accumulation on the surface. The tunnel is about 3 cm in depth at this point. 12/28/2021: The thigh wound is closed again. The foot wound has some callus that subsequently has peeled back exposing just a small slit of a wound. The lateral leg wound Is down to about half the size that it originally was and the tunnel is down to about half a centimeter in depth. 01/04/2022: The thigh wound remains closed. The foot wound has heavy callus overlying the wound site. Once this was debrided, the wound was found to be closed. The lateral leg wound is smaller again this week and very superficial. No  tunnel could be identified. 01/12/2022: The thigh and foot wounds both remain closed. The lateral leg wound is now nearly flush with the skin surface. There is good granulation tissue present with a light layer of slough. 01/19/2022: Due to the way his wrap was placed, the patient did not change the dressing on his thigh at all and so the foam was saturated and his skin is macerated.  There is a light layer of slough on the wound surface. The underlying granulation tissue is robust and healthy-appearing. He has heavy callus buildup at the site of his first metatarsal head wound which is still healed. 02/01/2022: He has been in silver alginate. When he removed the dressing from his thigh wound, however, some leg, superficially reopening a portion of the wound that had healed. In addition, underneath the callus at his left first metatarsal head, there appears to be a blister and the wound appears to be open again. 02/08/2022: The lateral leg wound has contracted substantially. There is eschar and a light layer of slough present. He says that it is starting to pull and is uncomfortable. On inspection, there is some puckering of the scar and the eschar is quite dry; this may account for his symptoms. On his first metatarsal head, the wound is much smaller with just some eschar on the surface. The callus has not reaccumulated. He reports that he had a blister come up on his medial thigh wound at the distal aspect. It popped and there is now an opening in his skin again. Looking back through his library of wound photos, there is what looks like a permanent suture just deep to this location and it may be trying to erode through. We have been using silver alginate on his wounds. 02/15/2022: The lateral leg wound is about half the size it was last week. It is clean with just a little perimeter eschar and light slough. The wound on his first metatarsal head is about the same with heavy callus overlying it. The medial  thigh wound is closed again. He does have some skin changes on the top of his foot that looks potentially yeast related. 02/22/2022: The skin on the top of his foot improved with the use of a topical antifungal. The lateral leg wound continues to contract and is again smaller this week. There is a little bit of slough and eschar on the surface. The first metatarsal head wound is a little bit smaller but has reaccumulated a thick callus over the top. He decided to try to trim his toenail and ultimately took the entire nail off of his left great toe. 03/02/2022: His lateral leg wound continues to improve, as does the wound on his left great toe. Unfortunately, it appears that somehow his foot got wet and moisture seeped in through the opening causing his skin to lift. There is a large wound now overlying his first metatarsal on both the plantar, medial, and dorsal portion of his foot. There is necrotic tissue and slough present underneath the shaggy macerated skin. 03/08/2022: The lateral leg wound is smaller again today. There is just a light layer of slough and eschar on the surface. The great toe wound is smaller again today. The first metatarsal wound is a little bit smaller today and does not look nearly as necrotic and macerated. There is still slough and nonviable tissue present. 03/15/2022: The lateral leg wound is narrower and just has a little bit of light slough buildup. The first metatarsal wound still has a fair amount of moisture affecting the periwound skin. The great toe wound is healed. 03/22/2022: The lateral leg wound is now isolated to just at the level of his knee. There is some eschar and slough accumulation. The first metatarsal head wound has epithelialized tremendously and is about half the size that it was last week. He still has some maceration on the top of his foot and a  fungal odor is present. 03/29/2022: T oday the patient's foot was macerated, suggesting that the cast got wet.  The patient has also been picking at his dry skin and has enlarged the wound on his left lateral leg. In the time between having his cast removed and my evaluation, he had picked more dry skin and opened up additional wounds on his Achilles area and dorsal foot. The plantar first metatarsal head wound, however, is smaller and clean with just macerated callus around the perimeter and light slough on the surface. The lateral leg wound measured a little bit larger but is also fairly clean with eschar and minimal slough. 04/02/2022: The patient had vascular studies done last Friday and so his cast was not applied. He is here today to have that done. 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 04/05/2022: Both wounds are smaller and quite clean. There is just a little biofilm on the lateral leg wound. 10/20; the patient has a wound on the left lateral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

## 2023-01-17 ENCOUNTER — Encounter (HOSPITAL_BASED_OUTPATIENT_CLINIC_OR_DEPARTMENT_OTHER): Payer: Medicare Other | Admitting: General Surgery

## 2023-01-17 ENCOUNTER — Encounter: Payer: Self-pay | Admitting: Nurse Practitioner

## 2023-01-17 DIAGNOSIS — L97528 Non-pressure chronic ulcer of other part of left foot with other specified severity: Secondary | ICD-10-CM | POA: Diagnosis not present

## 2023-01-18 ENCOUNTER — Other Ambulatory Visit: Payer: Self-pay

## 2023-01-18 DIAGNOSIS — E1165 Type 2 diabetes mellitus with hyperglycemia: Secondary | ICD-10-CM

## 2023-01-18 MED ORDER — OMNIPOD 5 DEXG7G6 PODS GEN 5 MISC: 2 refills | Status: DC

## 2023-01-18 NOTE — Progress Notes (Signed)
[name] ([id number]) [id number] Page 1 of 20 Visit Report for 01/17/2023 Chief Complaint Document Details Patient Name: Date of Service: [name] 01/17/2023 10:15 A M Medical Record Number: [id number] Patient Account Number: [id number] Date of Birth/Sex: Treating RN: 07-18-1950 (72 y.o. M) Primary Care Provider: [name] Other Clinician: Referring Provider: Treating Provider/Extender: [name] in Treatment: 92 Information Obtained from: Patient Chief Complaint Left leg and foot ulcers 04/12/2021; patient is here for wounds on his left lower leg and left plantar foot over the first metatarsal head Electronic Signature(s) Signed: 01/17/2023 11:02:36 AM By: [name] MD FACS Entered By: [name] on 01/17/2023 11:02:36 -------------------------------------------------------------------------------- Debridement Details Patient Name: Date of Service: [name]. 01/17/2023 10:15 A M Medical Record Number: [id number] Patient Account Number: [id number] Date of Birth/Sex: Treating RN: 1950-12-26 (72 y.o. [name] Primary Care Provider: [name] Other Clinician: Referring Provider: Treating Provider/Extender: [name] in Treatment: 92 Debridement Performed for Assessment: Wound #18R Left,Plantar Metatarsal head first Performed By: Physician [name], MD Debridement Type: Debridement Severity of Tissue Pre Debridement: Fat layer exposed Level of Consciousness (Pre-procedure): Awake and Alert Pre-procedure Verification/Time Out Yes - 10:52 Taken: Start Time: 10:53 Pain Control: Lidocaine 4% T opical Solution Percent of Wound Bed Debrided: 100% T Area Debrided (cm): otal 2.36 Tissue and other material debrided: Viable, Non-Viable, Eschar, Slough, Slough Level: Non-Viable Tissue Debridement Description: Selective/Open Wound Instrument:  Curette Bleeding: Minimum Hemostasis Achieved: Pressure End Time: 10:55 Procedural Pain: 0 Post Procedural Pain: 0 Response to Treatment: Procedure was tolerated well Level of Consciousness (Post- Awake and Alert procedure): Post Debridement Measurements of Total Wound Length: (cm) 2 Width: (cm) 1.5 Depth: (cm) 0.2 Volume: (cm) 0.471 [name] ([id number]) [id number] Page 2 of 20 Character of Wound/Ulcer Post Debridement: Improved Severity of Tissue Post Debridement: Fat layer exposed Post Procedure Diagnosis Same as Pre-procedure Notes Scribed for Dr Lady Gary by Brenton Grills RN. Electronic Signature(s) Signed: 01/17/2023 11:10:27 AM By: [name] MD FACS Signed: 01/18/2023 12:49:07 PM By: Brenton Grills Entered By: Brenton Grills on 01/17/2023 10:55:27 -------------------------------------------------------------------------------- HPI Details Patient Name: Date of Service: [name]. 01/17/2023 10:15 A M Medical Record Number: [id number] Patient Account Number: [id number] Date of Birth/Sex: Treating RN: 1951-06-12 (72 y.o. M) Primary Care Provider: [name] Other Clinician: Referring Provider: Treating Provider/Extender: [name] in Treatment: 22 History of Present Illness HPI Description: 10/11/17; [name] is a 72 year old man who tells me that in 2015 he slipped down the latter traumatizing his left leg. He developed a wound in the same spot the area that we are currently looking at. He states this closed over for the most part although he always felt it was somewhat unstable. In 2016 he hit the same area with the door of his car had this reopened. He tells me that this is never really closed although sometimes an inflow it remains open on a constant basis. He has not been using any specific dressing to this except for topical antibiotics the nature of which were not really sure. His primary doctor  did send him to see Dr. Jacinto Halim of interventional cardiology. He underwent an angiogram on 08/06/17 and he underwent a PTA and directional atherectomy of the lesser distal SFA and popliteal arteries which resulted in brisk improvement in blood flow. It was noted that he had 2 vessel runoff through the anterior tibial and peroneal. He is also been to see vascular  and interventional radiologist. He was not felt to have any significant superficial venous insufficiency. Presumably is not a candidate for any ablation. It was suggested he come here for wound care. The patient is a type II diabetic on insulin. He also has a history of venous insufficiency. ABIs on the left were noncompressible in our clinic 10/21/17; patient we admitted to the clinic last week. 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 We put Iodosorb on him after an aggressive debridement and 3 layer compression. He complained of pain in his ankle and itching with is skin in fact he scratched the area on the medial calf superiorly at the rim of our wraps and he has 2 small open areas in that location today which are new. I changed his primary dressing today to silver collagen. As noted he is already had revascularization and does not have any significant superficial venous insufficiency that would be amenable to ablation 10/28/17; patient admitted to the clinic 2 weeks ago. He has a smaller Wound. Scratch injury from last week revealed. There is large wound over the tibial area. This is smaller. Granulation looks healthy. No need for debridement. 11/04/17; the wound on the left lateral calf looks better. Improved dimensions. Surface of this looks better. We've been maintaining him and Kerlix Coban wraps. He finds this much more comfortable. Silver collagen dressing 11/11/17; left lateral Wound continues to look healthy be making progress. Using a #5 curet I removed removed nonviable  skin from the surface of the wound and then necrotic debris from the wound surface. Surface of the wound continues to look healthy. He also has an open area on the left great toenail bed. We've been using topical antibiotics. 11/19/17; left anterior lateral wound continues to look healthy but it's not closed. He also had a small wound above this on the left leg Initially traumatic wounds in the setting of significant chronic venous insufficiency and stasis dermatitis 11/25/17; left anterior wounds superiorly is closed still a small wound inferiorly. 12/02/17; left anterior tibial area. Arrives today with adherent callus. Post debridement clearly not completely closed. Hydrofera Blue under 3 layer compression. 12/09/17; left anterior tibia. Circumferential eschar however the wound bed looks stable to improved. We've been using Hydrofera Blue under 3 layer compression 12/17/17; left anterior tibia. Apparently this was felt to be closed however when the wrap was taken off there is a skin tear to reopen wounds in the same area we've been using Hydrofera Blue under 3 layer compression 12/23/17 left anterior tibia. Not close to close this week apparently the [facility] was stuck to this again. Still circumferential eschar requiring debridement. I put a contact layer on this this time under the Hydrofera Blue 12/31/17; left anterior tibia. Wound is better slight amount of hyper-granulation. Using Hydrofera Blue over Adaptic. 01/07/18; left anterior tibia. The wound had some surface eschar however after this was removed he has no open wound.he was already revascularized by Dr. Jacinto Halim when he came to our clinic with atherectomy of the left SFA and popliteal artery. He was also sent to interventional radiology for venous reflux studies. He was not felt to have significant reflux but certainly has chronic venous changes of his skin with hemosiderin deposition around this area. He will definitely need to lubricate his  skin and wear compression stocking and I've talked to him about this. READMISSION 05/26/2018 This is a now 72 year old man we cared for with traumatic  wounds on his left anterior lower extremity. He had been previously revascularized during that admission by Dr. Jacinto Halim. Apparently in follow-up Dr. Jacinto Halim noted that he had deterioration in his arterial status. He underwent a stent placement in the distal left SFA on 04/22/2018. 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 He went back to the angiography suite on 04/30/2018 he underwent PTA and SUHEB, BERMUDES ([id number]) [id number] Page 3 of 20 balloon angioplasty of the occluded left mid anterior tibial artery, thrombotic occlusion went from 100 to 0% which reconstitutes the posterior tibial artery. He had thrombectomy and aspiration of the peroneal artery. The stent placed in the distal SFA left SFA was still occluded. 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 He has been recuperating at [facility] home in [facility] after the most recent hospitalization. He is going home tomorrow. He tells me that 2 weeks ago he traumatized the tip of his left fifth toe. He came in urgently for our review of this. This was a history of before I noted that Dr. Jacinto Halim had already noted dry gangrenous changes of the left fifth toe 06/09/2018; 2-week follow-up. I did contact Dr. Jacinto Halim after his last appointment and he apparently saw 1 of Dr. Verl Dicker colleagues the next day. He does not follow-up with Dr. Jacinto Halim himself until Thursday of this week. 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 Nevertheless there is no evidence of infection no drainage and no pain. He  had a new area that this week when we were signing him in today on the left anterior mid tibia area, this is in close proximity to the previous wound we have dealt with in this clinic. 06/23/2018; 2-week follow-up. I did not receive a recent note from Dr. Jacinto Halim to review today. Our office is trying to obtain this. He is apparently not planning to do further vascular interventions and wondered about compression to try and help with the patient's chronic venous insufficiency. However we are also concerned about the arterial flow. He arrives in clinic today with a new area on the left third toe. The areas on the calf/anterior tibia are close to closing. The left fifth toe is still mummified using Betadine. -In reviewing things with the patient he has what sounds like claudication with mild to moderate amount of activity. 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 I prescribed Levaquin over the phone while we attempted to arrange a plan of care. However the patient called yesterday to report he had low-grade fever and he came in today acutely. There is been a marked deterioration in the left third toe with spreading cellulitis up into the dorsal left foot. He was referred to the emergency room. Readmission: 06/29/2020 patient presents today for reevaluation here in our clinic he was previously treated by Dr. Leanord Hawking at the latter part of 2019 in 2 the beginning of 2020. Subsequently we have not seen him since that time in the interim he did have evaluation with vein and vascular specialist specifically Dr. Bo Mcclintock who did perform quite extensive work for a left femoral to anterior tibial artery bypass. With that being said in the interim the patient has developed significant  lymphedema and has wounds that he tells me have really never healed in regard to the incision site on the left leg. He also has multiple wounds on the feet for various reasons some of which is that  he tends to pick at his feet. Fortunately there is no signs of active infection systemically at this time he does have some wounds that are little bit deeper but most are fairly superficial he seems to have good blood flow and overall everything appears to be healthy I see no bone exposed and no obvious signs of osteomyelitis. I do not know that he necessarily needs a x-ray at this point although that something we could consider depending on how things progress. The patient does have a history of lymphedema, diabetes, this is type II, chronic kidney disease stage III, hypertension, and history of peripheral vascular disease. 07/05/2020; patient admitted last week. Is a patient I remember from 2019 he had a spreading infection involving the left foot and we sent him to the hospital. 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 He subsequently had a left femoral to anterior tibial bypass by Dr.Cain vein and vascular. He also has severe lymphedema with chronic skin changes related to that on the left leg. The most problematic area that was new today was on the left medial great toe. This was apparently a small area last week there was purulent drainage which our intake nurse cultured. Also areas on the left medial foot and heel left lateral foot. He has 2 areas on the left medial calf left lateral calf in the setting of the severe lymphedema. 07/13/2020 on evaluation today patient appears to be doing better in my opinion compared to his last visit. The good news is there is no signs of active infection systemically and locally I do not see any signs of infection either. He did have an x-ray which was negative that is great news he had a culture which showed MRSA but at the same time he is been on the doxycycline which has helped. I do think we may want to extend this for 7 additional days 1/25; patient admitted to the clinic a few weeks ago. 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 We have been putting him under compression his edema control is a lot better but he is severe verricused skin on the left leg. He is really done quite well he still has an open area on the left medial calf and the left medial first metatarsal head. We have been using silver collagen on the leg silver alginate on the foot 07/27/2020 upon evaluation today patient appears to be doing decently well in regard to his wounds. He still has a lot of dry skin on the left leg. Some of this is starting to peel back and I think he may be able to have them out by removing some that today. Fortunately there is no signs of active infection at this time on the left leg although on the right leg he does appear to have swelling and erythema as well as some mild warmth to touch. This does have been concerned about the possibility of cellulitis although within the differential diagnosis I do think that potentially a DVT has to be at least considered. We need to rule that out before proceeding would just call in the cellulitis. Especially since he is having pain in the posterior aspect of  his calf muscle. 2/8; the patient had seen sparingly. He has severe skin changes of chronic lymphedema in the left leg thickened hyperkeratotic verrucous skin. He has an open wound on the medial part of the left first met head left mid tibia. He also has a rim of nonepithelialized skin in the anterior mid tibia. He brought in the AmLactin lotion that was been prescribed although I am not sure under compression and its utility. There concern about cellulitis on the right lower leg the last time he was here. He was put on on antibiotics. His DVT rule out was negative. The right leg looks fine he is using his stocking on this area 08/10/2020 upon evaluation today patient appears to be doing well with regard to his leg currently. He has been tolerating the dressing changes  without complication. Fortunately there is no signs of active infection which is great news. Overall very pleased with where things stand. 2/22; the patient still has an area on the medial part of the left first met his head. This looks better than when I last saw this earlier this month he has a rim of epithelialization but still some surface debris. Mostly everything on the left leg is healed. There is still a vulnerable in the left mid tibia area. 08/30/2020 upon evaluation today patient appears to be doing much better in regard to his wounds on his foot. 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 Nonetheless I think we will address that today I Minna send in a prescription for him in that regard. Overall though there does not appear to be any signs of significant worsening. 09/07/2020 on evaluation today patient's wounds over his left foot appear to be doing excellent. I do not see any signs of infection there is some callus buildup this can require debridement for certain but overall I feel like he is managing quite nicely. He still using the AmLactin cream which has been beneficial for him as well. 3/22; left foot wound is closed. There is no open area here. He is using ammonium lactate lotion to the lower extremities to help exfoliate dry cracked skin. He has compression stockings from elastic therapy in [place]. The wound on the medial part of his left first met head is healed today. READMISSION 04/12/2021 Mr. Sinkfield is a patient we know fairly well he had a prolonged stay in clinic in 2019 with wounds on his left lateral and left anterior lower extremity in the setting of chronic venous insufficiency. More recently he was here earlier this year with predominantly an area on his left foot first metatarsal head plantar and he says the plantar foot broke down on  its not long after we discharged him but he did not come back here. The last few months areas of broken down on his left anterior and again the left lateral lower extremity. The leg itself is very swollen chronically enlarged a lot of hyperkeratotic dry Berry Q skin in the left lower leg. His edema extends well into the thigh. He was seen by Dr. Randie Heinz. He had ABIs on 03/02/2021 showing an ABI on the right of 1 with a TBI of 0.72 his ABI in the left at 1.09 TBI of 0.99. Monophasic and biphasic waveforms on the right. On the left monophasic waveforms were noted he went on to have an angiogram on 03/27/2021 this  showed the aortic aortic and iliac segments were free of flow-limiting stenosis the left common femoral vein to evaluate the left femoral to anterior tibial artery bypass was unobstructed the bypass was patent without any areas of stenosis. We discharged the patient in bilateral juxta lite stockings but very clearly that was not sufficient to control the swelling and maintain skin integrity. He is clearly going to need compression pumps. HASTEN, SILVERSTONE ([id number]) [id number] Page 4 of 20 The patient is a security guard at a [employer] but he is telling me he is going to retire in 25 days. This is fortunate because he is on his feet for long periods of time. 10/27; patient comes in with our intake nurse reporting copious amount of green drainage from the left anterior mid tibia the left dorsal foot and to a lesser extent the left medial mid tibia. We left the compression wrap on all week for the amount of edema in his left leg is quite a bit better. We use silver alginate as the primary dressing 11/3; edema control is good. Left anterior lower leg left medial lower leg and the plantar first metatarsal head. The left anterior lower leg required debridement. Deep tissue culture I did of this wound showed MRSA I put him on 10 days of doxycycline which she will start today. We have  him in compression wraps. He has a security card and [employer] however he is retiring on November 15. We will need to then get him into a better offloading boot for the left foot perhaps a total contact cast 11/10; edema control is quite good. Left anterior and left medial lower leg wounds in the setting of chronic venous insufficiency and lymphedema. He also has a substantial area over the left plantar first metatarsal head. 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 He has significant hypergranulation on the left plantar foot wound. The patient is a diabetic but he does not have significant PAD 11/17; edema control is quite good. Left anterior and left medial lower leg wounds look better. The really concerning area remains the area on the left plantar first metatarsal head. He has a rim of epithelialization. 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 Starting today with a forefoot off loader but . possibly a total contact cast. 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 Arrives with a very significant odor from I think all of the wounds on the left leg and the left foot. Because of this I did not put a total contact cast on him today but will could still consider this. His wife was having cataract surgery which is the reason he missed the appointment 12/6. I saw this man 5 days ago with a swelling below the popliteal fossa. I thought he actually might have a Baker's cyst however the DVT rule out study that we could arrange right away was negative the technician told me this was not a ruptured Baker's cyst. We attempted to get this aspirated by under ultrasound guidance in interventional  radiology however all they did was an ultrasound however it shows an extensive fluid collection 62 x 8 x 9.4 in  the left thigh and left calf. The patient states he thinks this started 8 days ago or so but he really is not complaining of any pain, fever or systemic symptoms. He has not ha 12/20; after some difficulty I managed to get the patient into see Dr. Randie Heinz. Eventually he was taken into the hospital and had a drain put in the fluid collection below his left knee posteriorly extending into the posterior thigh. He still has the drain in place. Culture of this showed moderate staff aureus few Morganella and few Klebsiella he is now on doxycycline and ciprofloxacin as suggested by infectious disease he is on this for a month. The drain will remain in place until it stops draining 12/29; he comes in today with the 1 wound on his left leg and the area on the left plantar first met head significantly smaller. Both look healthy. He still has the drain in the left leg. He says he has to change this daily. Follows up with Dr. Randie Heinz on January 11. 06/29/2021; the wounds that I am following on the left leg and left first met head continued to be quite healthy. However the area where his inferior drain is in place had copious amounts of drainage which was green in color. The wound here is larger. Follows up with Dr. Pascal Lux of vein and vascular his surgeon next week as well as infectious disease. He remains on ciprofloxacin and doxycycline. He is not complaining of excessive pain in either one of the drain areas 1/12; the patient saw vascular surgery and infectious disease. Vascular surgery has left the drain in place as there was still some notable drainage still see him back in 2 weeks. Dr. Dorthula Perfect stop the doxycycline and ciprofloxacin and I do not believe he follows up with them at this point. Culture I did last week showed both doxycycline resistant MRSA and Pseudomonas not sensitive to ciprofloxacin although  only in rare titers 1/19; the patient's wound on the left anterior lower leg is just about healed. We have continued healing of the area that was medially on the left leg. Left first plantar metatarsal head continues to get smaller. The major problem here is his 2 drain sites 1 on the left upper calf and lateral thigh. There is purulent drainage still from the left lateral thigh. I gave him antibiotics last week but we still have recultured. He has the drain in the area I think this is eventually going to have to come out. I suspect there will be a connecting wound to heal here perhaps with improved VAc 1/26; the patient had his drain removed by vein and vascular on 1/25/. This was a large pocket of fluid in his left thigh that seem to tunnel into his left upper calf. 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 His mention his Penrose drain was removed today. He now has a tunneling wound on his left calf and left thigh. Both of these probe widely towards each other although I cannot really prove that they connect. Both wounds on his lower leg anteriorly are closed and his area over the first metatarsal head on his right foot continues to improve. We are using Hydrofera Blue here. He also saw infectious disease culture of the abscess they noted was polymicrobial with MRSA, Morganella and Klebsiella he was treated with doxycycline and ciprofloxacin for 4 weeks ending on 07/03/2021. They did not recommend any further antibiotics. Notable that while he still had the Penrose drain  in place last week he had purulent drainage coming out of the inferior IandD site this grew [facility], MRSA and Pseudomonas but there does not appear to be any active infection in this area today with the drain out and he is not systemically unwell 2/2; with regards to the drain sites the superior one on the thigh actually is closed down the one on the upper left lateral calf measures about 8 and  half centimeters which is an improvement seems to be less prominent although still with a lot of drainage. The only remaining wound is over the first metatarsal head on the left foot and this looks to be continuing to improve with Hydrofera Blue. 2/9; the area on his plantar left foot continues to contract. Callus around the wound edge. The drain sites specifically have not come down in depth. We put the wound VAC on Monday he changed the canister late last night our intake nurse reported a pocket of fluid perhaps caused by our compression wraps 2/16; continued improvement in left foot plantar wound. drainage site in the calf is not improved in terms of depth (wound vac) 2/23; continued improvement in the left foot wound over the first metatarsal head. With regards to the drain sites the area on his thigh laterally is healed however the open area on his calf is small in terms of circumference by still probes in by about 15 cm. Within using the wound VAC. Hydrofera Blue on his foot 08/24/2021: The left first metatarsal head wound continues to improve. The wound bed is healthy with just some surrounding callus. Unfortunately the open drain site on his calf remains open and tunnels at least 15 cm (the extent of a Q-tip). This is despite several weeks of wound VAC treatment. Based on reading back through the notes, there has been really no significant change in the depth of the wound, although the orifice is smaller and the more cranial wound on his thigh has closed. I suspect the tunnel tracks nearly all the way to this location. 08/31/2021: Continued improvement in the left first metatarsal head wound. There has been absolutely no improvement to the long tunnel from his open drain site on his calf. We have tried to get him into see vascular surgery sooner to consider the possibility of simply filleting the tract open and allowing it to heal from the bottom up, likely with a wound VAC. They have not yet  scheduled a sooner appointment than his current mid April 09/14/2021: He was seen by vascular surgery and they took him to the operating room last week. They opened a portion of the tunnel, but did not extend the entire length of the known open subcutaneous tract. I read Dr. Darcella Cheshire operative note and it is not clear from that documentation why only a portion of the tract was opened. The heaped up granulation tissue was curetted and removed from at least some portion of the tract. They did place a wound VAC and applied an Unna boot to the leg. The ulcer on his left first metatarsal head is smaller today. The bed looks good and there is just a small amount of surrounding callus. 09/21/2021: The ulcer on his left first metatarsal head looks to be stalled. There is some callus surrounding the wound but the wound bed itself does not appear particularly dynamic. The tunnel tract on his lateral left leg seems to be roughly the same length or perhaps slightly smaller but the wound bed appears healthy Dombrosky, Chandlor  W ([id number]) [id number] Page 5 of 20 with good granulation tissue. He opened up a new wound on his medial thigh and the site of a prior surgical incision. He says that he did this unconsciously in his sleep by scratching. 09/28/2021: Unfortunately, the ulcer on his left first metatarsal head has extended underneath the callus toward the dorsum of his foot. The medial thigh wounds are roughly the same. The tunnel on his lateral left leg continues to be problematic; it is longer than we are able to actually probe with a Q-tip. I am still not certain as to why Dr. Randie Heinz did not open this up entirely when he took the patient to the operating room. We will likely be back in the same situation with just a small superficial opening in a long unhealed tract, as the open portion is granulating in nicely. 10/02/2021: The patient was initially scheduled for a nurse visit, but we are also  applying a total contact cast today. The plantar foot wound looks clean without significant accumulated callus. We have been applying Prisma silver collagen to the site. 10/05/2021: The patient is here for his first total contact cast change. We have tried using gauze packing strips in the tunnel on his lateral leg wound, but this does not seem to be working any better than the white VAC foam. The foot ulcer looks about the same with minimal periwound callus. Medial thigh wound is clean with just some overlying eschar. 10/12/2021: The plantar foot wound is stable without any significant accumulation of periwound callus. The surface is viable with good granulation tissue. The medial thigh wounds are much smaller and are epithelializing. On the other hand, he had purulent drainage coming from the tunnel on his lateral leg. He does go back to see Dr. Randie Heinz next week and is planning to ask him why the wound tunnel was not completely opened at the time of his most recent operation. 10/19/2021: The plantar foot wound is markedly improved and has epithelial tissue coming through the surface. The medial thigh wounds are nearly closed with just a tiny open area. He did see Dr. Randie Heinz earlier this week and apparently they did discuss the possibility of opening the sinus tract further and enabling a wound VAC application. Apparently there are some limits as to what Dr. 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 I think if we could get the tract open to the level of the popliteal fossa, this would greatly aid in her ability to get this chart closed. That being said, however, today when I probed the tract with a Q-tip, I was not able to insert the entirety of the Q-tip as I have on previous occasions. The tunnel is shorter by about 4 cm. The surface is clean with good granulation tissue and no further episodes of purulent drainage. 10/30/2021: Last week, the patient underwent surgery  and had the long tract in his leg opened. There was a rind that was debrided, according to the operative report. His medial thigh ulcers are closed. The plantar foot wound is clean with a good surface and some built up surrounding callus. 11/06/2021: The overall dimensions of the large wound on his lateral leg remain about the same, but there is good granulation tissue present and the tunneling is a little bit shorter. He has a new wound on his anterior tibial surface, in the same location where he had a similar lesion in the past. The plantar foot wound is clean with  some buildup surrounding callus. Just toward the medial aspect of his foot, however, there is an area of darkening that once debrided, revealed another opening in the skin surface. 11/13/2021: The anterior tibial surface wound is closed. The plantar foot wound has some surrounding callus buildup. The area of darkening that I debrided last week and revealed an opening in the skin surface has closed again. The tunnel in the large wound on his lateral leg has come in by about 3 cm. There is healthy granulation tissue on the entire wound surface. 11/23/2021: The patient was out of town last week and did wet-to-dry dressings on his large wound. 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 Unfortunately, he picked open the wound on his left medial thigh. He says that it was itching and he just could not stop scratching it until it was open again. The wound on his plantar foot is smaller and has not accumulated a tremendous amount of callus. The lateral leg wound is shallower and the tunnel has also decreased in depth. There is just a little bit of slough accumulation on the surface. 11/30/2021: Another portion of his left medial thigh has been opened up. All of these wounds are fairly superficial with just a little bit of slough and eschar accumulation. The wound on his plantar foot is  almost closed with just a bit of eschar and periwound callus accumulation. The lateral leg wound is nearly flush with the surrounding skin and the tunnel is markedly shallower. 12/07/2021: There is just 1 open area on his left medial thigh. It is clean with just a little bit of perimeter eschar. The wound on his plantar foot continues to contract and just has some eschar and periwound callus accumulation. The lateral leg wound is closing at the more distal aspect and the tunnel is smaller. The surface is nearly flush with the surrounding skin and it has a good bed of granulation tissue. 12/14/2021: The thigh and foot wounds are closed. The lateral leg wound has closed over approximately half of its length. The tunnel continues to contract and the surface is now flush with the surrounding skin. The wound bed has robust granulation tissue. 12/22/2021: The thigh and foot wounds have reopened. The foot wound has a lot of callus accumulation around and over it. The thigh wound is tiny with just a little bit of slough in the wound bed. The lateral leg wound continues to contract. His vascular surgeon took the wound VAC off earlier in the week and the patient has been doing wet-to-dry dressings. There is a little slough accumulation on the surface. The tunnel is about 3 cm in depth at this point. 12/28/2021: The thigh wound is closed again. The foot wound has some callus that subsequently has peeled back exposing just a small slit of a wound. The lateral leg wound Is down to about half the size that it originally was and the tunnel is down to about half a centimeter in depth. 01/04/2022: The thigh wound remains closed. The foot wound has heavy callus overlying the wound site. Once this was debrided, the wound was found to be closed. The lateral leg wound is smaller again this week and very superficial. No tunnel could be identified. 01/12/2022: The thigh and foot wounds both remain closed. The lateral leg wound is  now nearly flush with the skin surface. There is good granulation tissue present with a light layer of slough.  01/19/2022: Due to the way his wrap was placed, the patient did not change the dressing on his thigh at all and so the foam was saturated and his skin is macerated. There is a light layer of slough on the wound surface. The underlying granulation tissue is robust and healthy-appearing. He has heavy callus buildup at the site of his first metatarsal head wound which is still healed. 02/01/2022: He has been in silver alginate. When he removed the dressing from his thigh wound, however, some leg, superficially reopening a portion of the wound that had healed. In addition, underneath the callus at his left first metatarsal head, there appears to be a blister and the wound appears to be open again. 02/08/2022: The lateral leg wound has contracted substantially. There is eschar and a light layer of slough present. He says that it is starting to pull and is uncomfortable. On inspection, there is some puckering of the scar and the eschar is quite dry; this may account for his symptoms. On his first metatarsal head, the wound is much smaller with just some eschar on the surface. The callus has not reaccumulated. He reports that he had a blister come up on his medial thigh wound at the distal aspect. It popped and there is now an opening in his skin again. Looking back through his library of wound photos, there is what looks like a permanent suture just deep to this location and it may be trying to erode through. We have been using silver alginate on his wounds. 02/15/2022: The lateral leg wound is about half the size it was last week. It is clean with just a little perimeter eschar and light slough. The wound on his first metatarsal head is about the same with heavy callus overlying it. The medial thigh wound is closed again. He does have some skin changes on the top of his foot that looks potentially  yeast related. 02/22/2022: The skin on the top of his foot improved with the use of a topical antifungal. The lateral leg wound continues to contract and is again smaller this week. There is a little bit of slough and eschar on the surface. The first metatarsal head wound is a little bit smaller but has reaccumulated a thick callus over the top. He decided to try to trim his toenail and ultimately took the entire nail off of his left great toe. 03/02/2022: His lateral leg wound continues to improve, as does the wound on his left great toe. Unfortunately, it appears that somehow his foot got wet and moisture seeped in through the opening causing his skin to lift. There is a large wound now overlying his first metatarsal on both the plantar, medial, and dorsal portion of his foot. There is necrotic tissue and slough present underneath the shaggy macerated skin. FURMAN, OBARR ([id number]) [id number] Page 6 of 20 03/08/2022: The lateral leg wound is smaller again today. There is just a light layer of slough and eschar on the surface. The great toe wound is smaller again today. The first metatarsal wound is a little bit smaller today and does not look nearly as necrotic and macerated. There is still slough and nonviable tissue present. 03/15/2022: The lateral leg wound is narrower and just has a little bit of light slough buildup. The first metatarsal wound still has a fair amount of moisture affecting the periwound skin. The great toe wound is healed. 03/22/2022: The lateral leg wound is now isolated to just at the level of  his knee. There is some eschar and slough accumulation. The first metatarsal head wound has epithelialized tremendously and is about half the size that it was last week. He still has some maceration on the top of his foot and a fungal odor is present. 03/29/2022: T oday the patient's foot was macerated, suggesting that the cast got wet. The patient has also been  picking at his dry skin and has enlarged the wound on his left lateral leg. In the time between having his cast removed and my evaluation, he had picked more dry skin and opened up additional wounds on his Achilles area and dorsal foot. The plantar first metatarsal head wound, however, is smaller and clean with just macerated callus around the perimeter and light slough on the surface. The lateral leg wound measured a little bit larger but is also fairly clean with eschar and minimal slough. 04/02/2022: The patient had vascular studies done last Friday and so his cast was not applied. He is here today to have that done. 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 04/05/2022: Both wounds are smaller and quite clean. There is just a little biofilm on the lateral leg wound. 10/20; the patient has a wound on the left lateral surgical incision at the level of his lateral knee this looks clean and improved. He is using silver alginate. He also has an area on his left medial foot for which she is using Hydrofera Blue under a total contact cast both wounds are measuring smaller 04/20/2022: The plantar foot wound has contracted considerably and is very close to closing. The lateral leg wound was measured a little larger, but there was a tiny open area that was included in the measurements that was not included last week. He has some eschar around the perimeter but otherwise the wound looks clean. 04/27/2022: The lateral leg wound looks better this week. He says that midweek, he felt it was very dry and began applying hydrogel to the site. I think this was beneficial. The foot wound is nearly closed underneath a thick layer of dry skin and callus. 05/04/2022: The foot wound is healed. He has developed a new small ulcer on his anterior tibial surface about midway up his leg. It has a little slough on the surface. The lateral leg wound still is fairly dry, but clean with just a little biofilm on the  surface. 05/11/2022: The wound on his foot reopened on Wednesday. A large blister formed which then broke open revealing the fat layer underneath. The ulcer on his anterior tibial surface is a little bit larger this week. The lateral leg wound has much better moisture balance this week. Fortunately, prior to his foot wound reopening, he did get the cast made for his orthotic. 05/15/2022: Already, the left medial foot wound has improved. The tissue is less macerated and the surface is clean. The ulcer on his anterior tibial surface continues to enlarge. This seems likely secondary to accumulated moisture. The lateral leg wound continues to have an improved moisture balance with the use of collagen. 05/25/2022: The medial foot wound continues to contract. It is now substantially smaller with just a little slough on the surface. The anterior tibial surface wound continues to enlarge further. Once again, this seems to be secondary to moisture. The lateral leg wound does not seem to be changing much in size, but the moisture balance is better. 06/01/2022: The anterior tibial wound is closed. The medial foot wound is down to just a  very small, couple of millimeters, opening. The lateral leg wound has good moisture balance, but remains unchanged in size. 12/15; the patient's anterior tibial wound has reopened, however the area on his right first metatarsal head is closed. The major wound is actually on the superior part of his surgical wound in the left lateral thigh. Not a completely viable surface under illumination. This may at some point require a debridement I think he is currently using Prisma. As noted the left medial foot wound has closed 06/14/2022: The anterior tibial wound has closed. The lateral leg wound has a better surface but is basically unchanged in size. The left medial foot wound has reopened. It looks as though there was some callus accumulation and moisture got under the callus which  caused the tissue to break down again. 06/21/2022: A new wound has opened up just distal to the previous anterior tibial wound. It is small but has hypertrophic granulation tissue present. The lateral leg wound is a little bit narrower and has a layer of slough on the surface. The left medial foot wound is down to just a pinhole. His custom orthotics should be available next week. 06/28/2022: The wound on his first metatarsal head has healed. He has developed a new small wound on his medial lower leg, in an old scar site. The lateral leg wound continues to contract but continues to accumulate slough, as well. 07/03/2022: Despite wearing his custom orthopedic shoes, he managed to reopen the wound on his first metatarsal head. He says he thinks his foot got wet and then some skin lifted up and he peeled this away. Both of the lower leg wounds are smaller and have some dry eschar on the surface. The lateral leg wound is quite a bit narrower today. 07/12/2022: The medial lower leg wound is closed. The anterior lower leg wound has contracted considerably. The lateral upper leg wound is narrower with a layer of slough on the surface. The first metatarsal head wound is also smaller, but had copious drainage which saturated the foam border dressing and resulted in some periwound tissue maceration. Fortunately there was no breakdown at this site. 07/19/2022: The lower leg shows signs of significant maceration; I think he must be sweating excessively inside his cast. There are several areas of skin breakdown present. The wound on his foot is smaller and that on his lateral leg is narrower and is shorter by about a centimeter. 07/26/2022: Last week we used a zinc Coflex wrap prior to applying his total contact cast and this has had the effect of keeping his skin from getting macerated this week. The anterior leg wound has epithelialized substantially. The lateral leg wound is significantly smaller with just a bit of  slough on the surface. The first metatarsal head wound is also smaller this week. 08/02/2022: The anterior leg wound was closed on arrival, but while he was sitting in the room, he picked it open again. The lateral leg wound is smaller with just a little slough on the surface and the first metatarsal head wound has contracted further, as well. 08/09/2022: The first metatarsal head wound is covered with callus. Underneath the callus, it is nearly completely closed. The lateral leg wound is smaller again this week. The anterior leg wound looks better, but he has such heavy buildup of old skin, that moisture is getting underneath this, becoming trapped, and causing the underlying skin to get macerated and open up. 08/16/2022: The first metatarsal head wound is closed. The lateral  leg wound continues to contract and is quite a bit smaller again this week. There is just a small, superficial opening remaining on his anterior tibial surface. 08/23/2022: The first metatarsal head wound has, by some miracle, remained closed. The lateral leg wound is substantially smaller with multiple areas of epithelialization. The anterior tibial surface wound is also quite a bit smaller and very clean. SHAUGHN, BJORKLUND ([id number]) [id number] Page 7 of 20 08/30/2022: Unfortunately, his first metatarsal head wound opened up again. It happened in the same fashion as it has on prior occasions. Moisture got under dried skin/callus and created a wound when he removed his sock, taking the skin with it. The anterior tibial surface has a thick shell of hyperkeratotic skin. This has been contributing to ongoing repeat wounding events as moisture gets underneath this and causes tissue breakdown. 3/15; patient presents for follow-up. His anterior left leg wound has healed. He still has the wound to the left lateral aspect and left first met head. We have been using silver alginate and endoform to these areas under  [employer]. He has no issues or complaints today. He has been taking Augmentin and reports improvement to his symptoms to the left first met head. 09/13/2022: He has accumulated more thick dry skin in sheets on his lower leg. The lateral leg wound is about the same size and the left first metatarsal head wound is a little bit smaller. There is slough on both surfaces. There is callus buildup around the foot wound. 09/20/2022: The lateral leg wound is a little bit narrower and the left first metatarsal head wound also seems to have contracted slightly. There is slough on both surfaces. He has a little skin breakdown on his anterior tibial surface. 09/27/2022: The lateral leg wound continues to contract and is quite clean. The first metatarsal head wound is also smaller. There is some perimeter callus and slough accumulation on the foot. The anterior tibial surface is closed. 10/04/2022: Both of his wounds are smaller today, particularly the first metatarsal head wound. 10/18/2022: He missed his appointment last week and ended up cutting off his wrap on Saturday. The anterior tibial wound reopened. It is fairly superficial with a little bit of slough on the surface. His lateral leg wound is smaller with some slough and eschar buildup. The first metatarsal head wound is also smaller with some callus and slough accumulation. 10/25/2022: All wounds are smaller. There is slough and eschar on the lateral leg and slough and callus on the plantar foot wound. The anterior tibial wound is clean and flush with the surrounding skin. No debris accumulation here. 11/01/2022: The wounds are all smaller again this week. There is slough on the lateral leg and some minimal slough and eschar on the anterior tibial wound. There is callus accumulation on the first metatarsal head site, along with slough. There is also a yeasty odor coming from the foot. 11/08/2022: The lateral leg wound is smaller except where he picked some skin  while waiting to be seen. There is a little bit of slough on the surface. The anterior tibial wound is closed. The first metatarsal head site has gotten macerated once again and has a lot of spongy wet tissue and callus around it. No yeast odor today. 11/15/2022: The lateral leg wound continues to contract. There is now a band of epithelium dividing it into 2 areas. Minimal slough accumulation. He managed to pick open a new wound on his medial lower leg. The first metatarsal head  site is smaller with some callus accumulation but no tissue maceration. 11/22/2022: The lateral leg wound is smaller again by about a third today. There is a little bit of slough on the surface with some periwound eschar. The new wound that he picked open last week has healed but he picked open 3 new small wounds on his anterior tibial surface, once again due to his picking at his skin. The first metatarsal head wound looks about the same. There has been some callus accumulation and there is more edema present, as he was not put in a compression wrap last week. 11/29/2022: The lateral leg wound is smaller again today. There is a little periwound eschar and some slough present. The wounds on his anterior tibial surface are all closed except for 1 that has a little bit of slough on the open portion with eschar covering it. The first metatarsal head wound measured a little bit smaller today, but mostly looks about the same. Edema control is better. 12/06/2022: The anterior tibial wound is closed. The lateral leg wound is smaller with some slough and eschar accumulation. The plantar foot ulcer is about the same size, but the tissue does show some evidence of the fact that he was on his feet quite a bit more this past week; there was a death in his family. There has been more callus accumulation and the tissues are little bit more purpleish. 12/13/2022: He picked the skin on his anterior tibia and reopened a wound there while he was  waiting to be seen in clinic. The lateral leg wound is much smaller with a little bit of slough and eschar. The plantar foot ulcer is smaller today with callus and slough buildup, but without the pressure induced tissue injury that was seen last week. 12/20/2022: The anterior tibial wound has healed. The lateral leg wound is smaller again this week. There is a little bit of eschar buildup on the surface. The foot had a fairly strong odor coming from it that persisted even after washing. The wound itself looks like its gotten a little smaller but he has built up thick callus, once again. 12/26/2022: The lateral leg wound is down to just a narrow superficial slit. There is slough and a little bit of dry skin present. The foot is in much better shape today. There is less callus accumulation and no odor. The skin edges are starting to roll inward, however. 01/03/2023: The lateral leg wound is healed. The skin is quite dry but the wound has closed. The foot continues to contract. He has a fair amount of periwound callus accumulation and the surface of the is a little drier than ideal. 01/10/2023: The large lateral leg wound remains closed. He managed to pick off some of his dry skin and has multiple small open superficial wounds on his lower leg. The foot wound measures smaller, but he has a blister immediately adjacent to it. 01/17/2023: The large lateral leg wound remains closed. The small superficial wounds on his lower leg have also closed. The foot wound is about the same and the blister area immediately adjacent to it has dark eschar on the surface. The foot wound looks a bit dry. Electronic Signature(s) Signed: 01/17/2023 11:04:43 AM By: [name] MD FACS Entered By: [name] on 01/17/2023 11:04:43 -------------------------------------------------------------------------------- Physical Exam Details Patient Name: Date of Service: [name]. 01/17/2023 10:15 A M Medical Record  Number: [id number] Patient Account Number: [id number] Date of Birth/Sex: Treating RN: [birth year] (71 y.o.  M) Primary Care Provider: [name] Other Clinician: [name] ([id number]) [id number] Page 8 of 20 Referring Provider: Treating Provider/Extender: [name] in Treatment: 92 Constitutional Slightly hypertensive. . . . Marland Kitchen Respiratory Normal work of breathing on room air. Notes 01/17/2023: The large lateral leg wound remains closed. The small superficial wounds on his lower leg have also closed. The foot wound is about the same and the blister area immediately adjacent to it has dark eschar on the surface. The foot wound looks a bit dry. Electronic Signature(s) Signed: 01/17/2023 11:05:37 AM By: [name] MD FACS Entered By: [name] on 01/17/2023 11:05:36 -------------------------------------------------------------------------------- Physician Orders Details Patient Name: Date of Service: [name]. 01/17/2023 10:15 A M Medical Record Number: [id number] Patient Account Number: [id number] Date of Birth/Sex: Treating RN: 1951-05-04 (72 y.o. [name] Primary Care Provider: [name] Other Clinician: Referring Provider: Treating Provider/Extender: [name] in Treatment: 56 Verbal / Phone Orders: No Diagnosis Coding ICD-10 Coding Code Description L97.528 Non-pressure chronic ulcer of other part of left foot with other specified severity L85.9 Epidermal thickening, unspecified I87.322 Chronic venous hypertension (idiopathic) with inflammation of left lower extremity E11.51 Type 2 diabetes mellitus with diabetic peripheral angiopathy without gangrene I89.0 Lymphedema, not elsewhere classified Follow-up Appointments ppointment in 1 week. - Dr Lady Gary - Room 2 Return A Insurance was checked for coverage of Apl[employer] and Epic[employer] and showed a 20% copay. Anesthetic (In  clinic) Topical Lidocaine 4% applied to wound bed Bathing/ Shower/ Hygiene May shower with protection but do not get wound dressing(s) wet. Protect dressing(s) with water repellant cover (for example, large plastic bag) or a cast cover and may then take shower. Edema Control - Lymphedema / SCD / Other Elevate legs to the level of the heart or above for 30 minutes daily and/or when sitting for 3-4 times a day throughout the day. Avoid standing for long periods of time. Patient to wear own compression stockings every day. - both legs daily Moisturize legs daily. Compression stocking or Garment 20-30 mm/Hg pressure to: Off-Loading Wound #18R Left,Plantar Metatarsal head first Open toe surgical shoe to: - Front off loader Left ft Other: - minimal weight bearing left foot Additional Orders / Instructions Follow Nutritious Diet - vitamin C 500 mg 3 times a day and zinc 30-50 mg a day Wound Treatment Wound #18R - Metatarsal head first Wound Laterality: Plantar, Left AZURE, MIRANTE ([id number]) [id number] Page 9 of 20 Cleanser: Soap and Water 1 x Per Week/30 Days Discharge Instructions: May shower and wash wound with dial antibacterial soap and water prior to dressing change. Cleanser: Wound Cleanser 1 x Per Week/30 Days Discharge Instructions: Cleanse the wound with wound cleanser prior to applying a clean dressing using gauze sponges, not tissue or cotton balls. Peri-Wound Care: Ketoconazole Cream 2% 1 x Per Week/30 Days Discharge Instructions: Apply Ketoconazole as directed Peri-Wound Care: Triamcinolone 15 (g) 1 x Per Week/30 Days Discharge Instructions: Use triamcinolone 15 (g) as directed Topical: Gentamicin 1 x Per Week/30 Days Discharge Instructions: As directed by physician Topical: Mupirocin Ointment 1 x Per Week/30 Days Discharge Instructions: Apply Mupirocin (Bactroban) as instructed Prim Dressing: Promogran Prisma Matrix, 4.34 (sq in) (silver collagen)  1 x Per Week/30 Days ary Discharge Instructions: Moisten collagen with saline or hydrogel Secondary Dressing: Optifoam Non-Adhesive Dressing, 4x4 in (Generic) 1 x Per Week/30 Days Discharge Instructions: Apply over primary dressing as directed. Secondary Dressing: Woven Gauze Sponges 2x2 in (Generic) 1 x Per  Week/30 Days Discharge Instructions: Apply over primary dressing as directed. Secondary Dressing: Zetuvit Absorbent Pad, 4x4 (in/in) 1 x Per Week/30 Days Compression Wrap: Urgo K2, (equivalent to a 4 layer) two layer compression system, regular 1 x Per Week/30 Days Discharge Instructions: Apply Urgo K2 as directed (alternative to 4 layer compression). Electronic Signature(s) Signed: 01/17/2023 11:10:27 AM By: [name] MD FACS Entered By: [name] on 01/17/2023 11:06:05 -------------------------------------------------------------------------------- Problem List Details Patient Name: Date of Service: [name]. 01/17/2023 10:15 A M Medical Record Number: [id number] Patient Account Number: [id number] Date of Birth/Sex: Treating RN: Apr 24, 1951 (72 y.o. [name] Primary Care Provider: [name] Other Clinician: Referring Provider: Treating Provider/Extender: [name] in Treatment: 3 Active Problems ICD-10 Encounter Code Description Active Date MDM Diagnosis L97.528 Non-pressure chronic ulcer of other part of left foot with other specified 08/26/2022 No Yes severity L85.9 Epidermal thickening, unspecified 08/30/2022 No Yes I87.322 Chronic venous hypertension (idiopathic) with inflammation of left lower 04/12/2021 No Yes extremity E11.51 Type 2 diabetes mellitus with diabetic peripheral angiopathy without gangrene 04/12/2021 No Yes JIHAD, DUFFIE ([id number]) [id number] Page 10 of 20 I89.0 Lymphedema, not elsewhere classified 04/12/2021 No Yes Inactive Problems ICD-10 Code Description Active Date  Inactive Date E11.621 Type 2 diabetes mellitus with foot ulcer 04/12/2021 04/12/2021 E11.42 Type 2 diabetes mellitus with diabetic polyneuropathy 04/12/2021 04/12/2021 L02.416 Cutaneous abscess of left lower limb 06/13/2021 06/13/2021 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 07/20/2021 07/20/2021 Resolved Problems ICD-10 Code Description Active Date Resolved Date L97.828 Non-pressure chronic ulcer of other part of left lower leg with other specified severity 04/12/2021 04/12/2021 L97.828 Non-pressure chronic ulcer of other part of left lower leg with other specified severity 01/10/2023 06/08/2022 Electronic Signature(s) Signed: 01/17/2023 11:00:59 AM By: [name] MD FACS Entered By: [name] on 01/17/2023 11:00:59 -------------------------------------------------------------------------------- Progress Note Details Patient Name: Date of Service: [name]. 01/17/2023 10:15 A M Medical Record Number: [id number] Patient Account Number: [id number] Date of Birth/Sex: Treating RN: October 12, 1950 (72 y.o. M) Primary Care Provider: [name] Other Clinician: Referring Provider: Treating Provider/Extender: [name] in Treatment: 92 Subjective Chief Complaint Information obtained from Patient Left leg and foot ulcers 04/12/2021; patient is here for wounds on his left lower leg and left plantar foot over the first metatarsal head History of Present Illness (HPI) 10/11/17; Mr. Folker is a 72 year old man who tells me that in 2015 he slipped down the latter traumatizing his left leg. He developed a wound in the same spot the area that we are currently looking at. He states this closed over for the most part although he always felt it was somewhat unstable. In 2016 he hit the same area with the door of his car had this reopened. He tells me that this is never really closed although sometimes an inflow it remains open on a  constant basis. He has not been using any specific dressing to this except for topical antibiotics the nature of which were not really sure. His primary doctor did send him to see Dr. Jacinto Halim of interventional cardiology. He underwent an angiogram on 08/06/17 and he underwent a PTA and directional atherectomy of the lesser distal SFA and popliteal arteries which resulted in brisk improvement in blood flow. It was noted that he had 2 vessel runoff through the anterior tibial and peroneal. He is also been to see vascular and interventional radiologist. He was not felt to have any significant superficial venous  insufficiency. Presumably is not a candidate for any ablation. It was suggested he come here for wound care. The patient is a type II diabetic on insulin. He also has a history of venous insufficiency. ABIs on the left were noncompressible in our clinic ALYSSA, DEUTSCHMAN ([id number]) [id number] Page 11 of 20 10/21/17; patient we admitted to the clinic last week. 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 We put Iodosorb on him after an aggressive debridement and 3 layer compression. He complained of pain in his ankle and itching with is skin in fact he scratched the area on the medial calf superiorly at the rim of our wraps and he has 2 small open areas in that location today which are new. I changed his primary dressing today to silver collagen. As noted he is already had revascularization and does not have any significant superficial venous insufficiency that would be amenable to ablation 10/28/17; patient admitted to the clinic 2 weeks ago. He has a smaller Wound. Scratch injury from last week revealed. There is large wound over the tibial area. This is smaller. Granulation looks healthy. No need for debridement. 11/04/17; the wound on the left lateral calf looks better. Improved dimensions. Surface of this  looks better. We've been maintaining him and Kerlix Coban wraps. He finds this much more comfortable. Silver collagen dressing 11/11/17; left lateral Wound continues to look healthy be making progress. Using a #5 curet I removed removed nonviable skin from the surface of the wound and then necrotic debris from the wound surface. Surface of the wound continues to look healthy. He also has an open area on the left great toenail bed. We've been using topical antibiotics. 11/19/17; left anterior lateral wound continues to look healthy but it's not closed. He also had a small wound above this on the left leg Initially traumatic wounds in the setting of significant chronic venous insufficiency and stasis dermatitis 11/25/17; left anterior wounds superiorly is closed still a small wound inferiorly. 12/02/17; left anterior tibial area. Arrives today with adherent callus. Post debridement clearly not completely closed. Hydrofera Blue under 3 layer compression. 12/09/17; left anterior tibia. Circumferential eschar however the wound bed looks stable to improved. We've been using Hydrofera Blue under 3 layer compression 12/17/17; left anterior tibia. Apparently this was felt to be closed however when the wrap was taken off there is a skin tear to reopen wounds in the same area we've been using Hydrofera Blue under 3 layer compression 12/23/17 left anterior tibia. Not close to close this week apparently the [facility] was stuck to this again. Still circumferential eschar requiring debridement. I put a contact layer on this this time under the Hydrofera Blue 12/31/17; left anterior tibia. Wound is better slight amount of hyper-granulation. Using Hydrofera Blue over Adaptic. 01/07/18; left anterior tibia. The wound had some surface eschar however after this was removed he has no open wound.he was already revascularized by Dr. Jacinto Halim when he came to our clinic with atherectomy of the left SFA and popliteal artery. He was  also sent to interventional radiology for venous reflux studies. He was not felt to have significant reflux but certainly has chronic venous changes of his skin with hemosiderin deposition around this area. He will definitely need to lubricate his skin and wear compression stocking and I've talked to him about this. READMISSION 05/26/2018 This is a now 72 year old man we cared for with traumatic wounds on his left  anterior lower extremity. He had been previously revascularized during that admission by Dr. Jacinto Halim. Apparently in follow-up Dr. Jacinto Halim noted that he had deterioration in his arterial status. He underwent a stent placement in the distal left SFA on 04/22/2018. 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 He went back to the angiography suite on 04/30/2018 he underwent PTA and balloon angioplasty of the occluded left mid anterior tibial artery, thrombotic occlusion went from 100 to 0% which reconstitutes the posterior tibial artery. He had thrombectomy and aspiration of the peroneal artery. The stent placed in the distal SFA left SFA was still occluded. 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 He has been recuperating at [facility] home in [facility] after the most recent hospitalization. He is going home tomorrow. He tells me that 2 weeks ago he traumatized the tip of his left fifth toe. He came in urgently for our review of this. This was a history of before I noted that Dr. Jacinto Halim had already noted dry gangrenous changes of the left fifth toe 06/09/2018; 2-week follow-up. I did contact Dr. Jacinto Halim after his last appointment and he apparently saw 1 of Dr. Verl Dicker colleagues the next day. He does not follow-up with Dr. Jacinto Halim  himself until Thursday of this week. 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 Nevertheless there is no evidence of infection no drainage and no pain. He had a new area that this week when we were signing him in today on the left anterior mid tibia area, this is in close proximity to the previous wound we have dealt with in this clinic. 06/23/2018; 2-week follow-up. I did not receive a recent note from Dr. Jacinto Halim to review today. Our office is trying to obtain this. He is apparently not planning to do further vascular interventions and wondered about compression to try and help with the patient's chronic venous insufficiency. However we are also concerned about the arterial flow. He arrives in clinic today with a new area on the left third toe. The areas on the calf/anterior tibia are close to closing. The left fifth toe is still mummified using Betadine. -In reviewing things with the patient he has what sounds like claudication with mild to moderate amount of activity. 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 I prescribed Levaquin over the phone while we attempted to arrange a plan of care. However the patient called yesterday to report he had low-grade fever and he came in today acutely. There is been a marked deterioration in the left third toe with spreading cellulitis up into the dorsal left foot. He was referred to the emergency room. Readmission: 06/29/2020 patient presents today for reevaluation here in our clinic he was previously treated by Dr. Leanord Hawking at the latter part of 2019 in 2 the beginning of 2020. Subsequently we have not seen him since that time in the interim he did have evaluation with vein and vascular specialist specifically Dr. Bo Mcclintock who did perform quite extensive work for a left femoral to anterior tibial artery bypass. With that being said in the interim the patient has developed significant lymphedema and has wounds  that he tells me have really never healed  in regard to the incision site on the left leg. He also has multiple wounds on the feet for various reasons some of which is that he tends to pick at his feet. Fortunately there is no signs of active infection systemically at this time he does have some wounds that are little bit deeper but most are fairly superficial he seems to have good blood flow and overall everything appears to be healthy I see no bone exposed and no obvious signs of osteomyelitis. I do not know that he necessarily needs a x-ray at this point although that something we could consider depending on how things progress. The patient does have a history of lymphedema, diabetes, this is type II, chronic kidney disease stage III, hypertension, and history of peripheral vascular disease. 07/05/2020; patient admitted last week. Is a patient I remember from 2019 he had a spreading infection involving the left foot and we sent him to the hospital. 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 He subsequently had a left femoral to anterior tibial bypass by Dr.Cain vein and vascular. He also has severe lymphedema with chronic skin changes related to that on the left leg. The most problematic area that was new today was on the left medial great toe. This was apparently a small area last week there was purulent drainage which our intake nurse cultured. Also areas on the left medial foot and heel left lateral foot. He has 2 areas on the left medial calf left lateral calf in the setting of the severe lymphedema. 07/13/2020 on evaluation today patient appears to be doing better in my opinion compared to his last visit. The good news is there is no signs of active infection systemically and locally I do not see any signs of infection either. He did have an x-ray which was negative that is great news he had a culture which showed MRSA but at the same time he is been on the  doxycycline which has helped. I do think we may want to extend this for 7 additional days 1/25; patient admitted to the clinic a few weeks ago. 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 We have been putting him under compression his edema control is a lot better but he is severe verricused skin on the left leg. He is really done quite well he still has an open area on the left medial calf and the left medial first metatarsal head. We have been using silver collagen on the leg silver alginate on the foot 07/27/2020 upon evaluation today patient appears to be doing decently well in regard to his wounds. He still has a lot of dry skin on the left leg. Some of this is starting to peel back and I think he may be able to have them out by removing some that today. Fortunately there is no signs of active infection at this time on the left leg although on the right leg he does appear to have swelling and erythema as well as some mild warmth to touch. This does have been concerned about the possibility of cellulitis although within the differential diagnosis I do think that potentially a DVT has to be at least considered. We need to rule that out before proceeding would just call in the cellulitis. Especially since he is having pain in the posterior aspect of his calf muscle. 2/8; the patient had seen sparingly. He has severe skin  changes of chronic lymphedema in the left leg thickened hyperkeratotic verrucous skin. He has an open wound on the medial part of the left first met head left mid tibia. He also has a rim of nonepithelialized skin in the anterior mid tibia. He brought in the MACDONALD, RAZZAK ([id number]) [id number] Page 12 of 20 AmLactin lotion that was been prescribed although I am not sure under compression and its utility. There concern about cellulitis on the right lower leg the last time he was here. He was put on on antibiotics.  His DVT rule out was negative. The right leg looks fine he is using his stocking on this area 08/10/2020 upon evaluation today patient appears to be doing well with regard to his leg currently. He has been tolerating the dressing changes without complication. Fortunately there is no signs of active infection which is great news. Overall very pleased with where things stand. 2/22; the patient still has an area on the medial part of the left first met his head. This looks better than when I last saw this earlier this month he has a rim of epithelialization but still some surface debris. Mostly everything on the left leg is healed. There is still a vulnerable in the left mid tibia area. 08/30/2020 upon evaluation today patient appears to be doing much better in regard to his wounds on his foot. 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 Nonetheless I think we will address that today I Minna send in a prescription for him in that regard. Overall though there does not appear to be any signs of significant worsening. 09/07/2020 on evaluation today patient's wounds over his left foot appear to be doing excellent. I do not see any signs of infection there is some callus buildup this can require debridement for certain but overall I feel like he is managing quite nicely. He still using the AmLactin cream which has been beneficial for him as well. 3/22; left foot wound is closed. There is no open area here. He is using ammonium lactate lotion to the lower extremities to help exfoliate dry cracked skin. He has compression stockings from [facility] in [place]. The wound on the medial part of his left first met head is healed today. READMISSION 04/12/2021 Mr. Bowen is a patient we know fairly well he had a prolonged stay in clinic in 2019 with wounds on his left lateral and  left anterior lower extremity in the setting of chronic venous insufficiency. More recently he was here earlier this year with predominantly an area on his left foot first metatarsal head plantar and he says the plantar foot broke down on its not long after we discharged him but he did not come back here. The last few months areas of broken down on his left anterior and again the left lateral lower extremity. The leg itself is very swollen chronically enlarged a lot of hyperkeratotic dry Berry Q skin in the left lower leg. His edema extends well into the thigh. He was seen by Dr. Randie Heinz. He had ABIs on 03/02/2021 showing an ABI on the right of 1 with a TBI of 0.72 his ABI in the left at 1.09 TBI of 0.99. Monophasic and biphasic waveforms on the right. On the left monophasic waveforms were noted he went on to have an angiogram on 03/27/2021 this showed the aortic aortic  and iliac segments were free of flow-limiting stenosis the left common femoral vein to evaluate the left femoral to anterior tibial artery bypass was unobstructed the bypass was patent without any areas of stenosis. We discharged the patient in bilateral juxta lite stockings but very clearly that was not sufficient to control the swelling and maintain skin integrity. He is clearly going to need compression pumps. 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 This is fortunate because he is on his feet for long periods of time. 10/27; patient comes in with our intake nurse reporting copious amount of green drainage from the left anterior mid tibia the left dorsal foot and to a lesser extent the left medial mid tibia. We left the compression wrap on all week for the amount of edema in his left leg is quite a bit better. We use silver alginate as the primary dressing 11/3; edema control is good. Left anterior lower leg left medial lower leg and the plantar first metatarsal head. The left  anterior lower leg required debridement. Deep tissue culture I did of this wound showed MRSA I put him on 10 days of doxycycline which she will start today. We have him in compression wraps. He has a security card and [employer] however he is retiring on November 15. We will need to then get him into a better offloading boot for the left foot perhaps a total contact cast 11/10; edema control is quite good. Left anterior and left medial lower leg wounds in the setting of chronic venous insufficiency and lymphedema. He also has a substantial area over the left plantar first metatarsal head. 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 He has significant hypergranulation on the left plantar foot wound. The patient is a diabetic but he does not have significant PAD 11/17; edema control is quite good. Left anterior and left medial lower leg wounds look better. The really concerning area remains the area on the left plantar first metatarsal head. He has a rim of epithelialization. 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 Starting today with a forefoot off loader but . possibly a total contact cast. 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 Arrives with a very significant odor from I think all of the wounds on the left leg and the left foot. Because of this I did not put a total contact cast on him today but will could still consider this. His wife was having cataract surgery which is the reason he missed the appointment 12/6. I saw this man 5 days ago with a swelling below the popliteal fossa. I thought he actually might have a Baker's cyst however the DVT rule out study that we could arrange right  away was negative the technician told me this was not a ruptured Baker's cyst. We attempted to get this aspirated by under ultrasound guidance in interventional radiology however all they did was an ultrasound however it shows an extensive fluid collection 62 x 8 x 9.4 in the left thigh and left calf. The patient states he thinks this started  8 days ago or so but he really is not complaining of any pain, fever or systemic symptoms. He has not ha 12/20; after some difficulty I managed to get the patient into see Dr. Randie Heinz. Eventually he was taken into the hospital and had a drain put in the fluid collection below his left knee posteriorly extending into the posterior thigh. He still has the drain in place. Culture of this showed moderate staff aureus few Morganella and few Klebsiella he is now on doxycycline and ciprofloxacin as suggested by infectious disease he is on this for a month. The drain will remain in place until it stops draining 12/29; he comes in today with the 1 wound on his left leg and the area on the left plantar first met head significantly smaller. Both look healthy. He still has the drain in the left leg. He says he has to change this daily. Follows up with Dr. Randie Heinz on January 11. 06/29/2021; the wounds that I am following on the left leg and left first met head continued to be quite healthy. However the area where his inferior drain is in place had copious amounts of drainage which was green in color. The wound here is larger. Follows up with Dr. Pascal Lux of vein and vascular his surgeon next week as well as infectious disease. He remains on ciprofloxacin and doxycycline. He is not complaining of excessive pain in either one of the drain areas 1/12; the patient saw vascular surgery and infectious disease. Vascular surgery has left the drain in place as there was still some notable drainage still see him back in 2 weeks. Dr. Dorthula Perfect stop the doxycycline and ciprofloxacin and I do not believe  he follows up with them at this point. Culture I did last week showed both doxycycline resistant MRSA and Pseudomonas not sensitive to ciprofloxacin although only in rare titers 1/19; the patient's wound on the left anterior lower leg is just about healed. We have continued healing of the area that was medially on the left leg. Left first plantar metatarsal head continues to get smaller. The major problem here is his 2 drain sites 1 on the left upper calf and lateral thigh. There is purulent drainage still from the left lateral thigh. I gave him antibiotics last week but we still have recultured. He has the drain in the area I think this is eventually going to have to come out. I suspect there will be a connecting wound to heal here perhaps with improved VAc 1/26; the patient had his drain removed by vein and vascular on 1/25/. This was a large pocket of fluid in his left thigh that seem to tunnel into his left upper calf. 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 His mention his Penrose drain was removed today. He now has a tunneling wound on his left SAMBA, MADEIRA ([id number]) [id number] Page 13 of 20 calf and left thigh. Both of these probe widely towards each other although I cannot really prove that they connect. Both wounds on his lower leg anteriorly are closed and his area over the first metatarsal head on his right foot continues to improve. We are using Hydrofera Blue here. He also saw infectious disease culture of the abscess they noted was polymicrobial with MRSA, Morganella and Klebsiella he was treated with doxycycline and ciprofloxacin for 4 weeks ending on 07/03/2021. They did not recommend any further antibiotics. Notable that while he still had the Penrose drain in place last week  he had purulent drainage coming out of the inferior IandD site this grew [facility], MRSA and Pseudomonas but there does not appear to be any active  infection in this area today with the drain out and he is not systemically unwell 2/2; with regards to the drain sites the superior one on the thigh actually is closed down the one on the upper left lateral calf measures about 8 and half centimeters which is an improvement seems to be less prominent although still with a lot of drainage. The only remaining wound is over the first metatarsal head on the left foot and this looks to be continuing to improve with Hydrofera Blue. 2/9; the area on his plantar left foot continues to contract. Callus around the wound edge. The drain sites specifically have not come down in depth. We put the wound VAC on Monday he changed the canister late last night our intake nurse reported a pocket of fluid perhaps caused by our compression wraps 2/16; continued improvement in left foot plantar wound. drainage site in the calf is not improved in terms of depth (wound vac) 2/23; continued improvement in the left foot wound over the first metatarsal head. With regards to the drain sites the area on his thigh laterally is healed however the open area on his calf is small in terms of circumference by still probes in by about 15 cm. Within using the wound VAC. Hydrofera Blue on his foot 08/24/2021: The left first metatarsal head wound continues to improve. The wound bed is healthy with just some surrounding callus. Unfortunately the open drain site on his calf remains open and tunnels at least 15 cm (the extent of a Q-tip). This is despite several weeks of wound VAC treatment. Based on reading back through the notes, there has been really no significant change in the depth of the wound, although the orifice is smaller and the more cranial wound on his thigh has closed. I suspect the tunnel tracks nearly all the way to this location. 08/31/2021: Continued improvement in the left first metatarsal head wound. There has been absolutely no improvement to the long tunnel from his open  drain site on his calf. We have tried to get him into see vascular surgery sooner to consider the possibility of simply filleting the tract open and allowing it to heal from the bottom up, likely with a wound VAC. They have not yet scheduled a sooner appointment than his current mid April 09/14/2021: He was seen by vascular surgery and they took him to the operating room last week. They opened a portion of the tunnel, but did not extend the entire length of the known open subcutaneous tract. I read Dr. Darcella Cheshire operative note and it is not clear from that documentation why only a portion of the tract was opened. The heaped up granulation tissue was curetted and removed from at least some portion of the tract. They did place a wound VAC and applied an Unna boot to the leg. The ulcer on his left first metatarsal head is smaller today. The bed looks good and there is just a small amount of surrounding callus. 09/21/2021: The ulcer on his left first metatarsal head looks to be stalled. There is some callus surrounding the wound but the wound bed itself does not appear particularly dynamic. The tunnel tract on his lateral left leg seems to be roughly the same length or perhaps slightly smaller but the wound bed appears healthy with good granulation tissue. He opened  up a new wound on his medial thigh and the site of a prior surgical incision. He says that he did this unconsciously in his sleep by scratching. 09/28/2021: Unfortunately, the ulcer on his left first metatarsal head has extended underneath the callus toward the dorsum of his foot. The medial thigh wounds are roughly the same. The tunnel on his lateral left leg continues to be problematic; it is longer than we are able to actually probe with a Q-tip. I am still not certain as to why Dr. Randie Heinz did not open this up entirely when he took the patient to the operating room. We will likely be back in the same situation with just a small superficial opening in  a long unhealed tract, as the open portion is granulating in nicely. 10/02/2021: The patient was initially scheduled for a nurse visit, but we are also applying a total contact cast today. The plantar foot wound looks clean without significant accumulated callus. We have been applying Prisma silver collagen to the site. 10/05/2021: The patient is here for his first total contact cast change. We have tried using gauze packing strips in the tunnel on his lateral leg wound, but this does not seem to be working any better than the white VAC foam. The foot ulcer looks about the same with minimal periwound callus. Medial thigh wound is clean with just some overlying eschar. 10/12/2021: The plantar foot wound is stable without any significant accumulation of periwound callus. The surface is viable with good granulation tissue. The medial thigh wounds are much smaller and are epithelializing. On the other hand, he had purulent drainage coming from the tunnel on his lateral leg. He does go back to see Dr. Randie Heinz next week and is planning to ask him why the wound tunnel was not completely opened at the time of his most recent operation. 10/19/2021: The plantar foot wound is markedly improved and has epithelial tissue coming through the surface. The medial thigh wounds are nearly closed with just a tiny open area. He did see Dr. Randie Heinz earlier this week and apparently they did discuss the possibility of opening the sinus tract further and enabling a wound VAC application. Apparently there are some limits as to what Dr. 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 I think if we could get the tract open to the level of the popliteal fossa, this would greatly aid in her ability to get this chart closed. That being said, however, today when I probed the tract with a Q-tip, I was not able to insert the entirety of the Q-tip as I have on previous occasions. The tunnel is shorter by about 4 cm.  The surface is clean with good granulation tissue and no further episodes of purulent drainage. 10/30/2021: Last week, the patient underwent surgery and had the long tract in his leg opened. There was a rind that was debrided, according to the operative report. His medial thigh ulcers are closed. The plantar foot wound is clean with a good surface and some built up surrounding callus. 11/06/2021: The overall dimensions of the large wound on his lateral leg remain about the same, but there is good granulation tissue present and the tunneling is a little bit shorter. He has a new wound on his anterior tibial surface, in the same location where he had a similar lesion in the past. The plantar foot wound is clean with some buildup surrounding callus. Just toward the medial aspect of his foot, however,  there is an area of darkening that once debrided, revealed another opening in the skin surface. 11/13/2021: The anterior tibial surface wound is closed. The plantar foot wound has some surrounding callus buildup. The area of darkening that I debrided last week and revealed an opening in the skin surface has closed again. The tunnel in the large wound on his lateral leg has come in by about 3 cm. There is healthy granulation tissue on the entire wound surface. 11/23/2021: The patient was out of town last week and did wet-to-dry dressings on his large wound. 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 Unfortunately, he picked open the wound on his left medial thigh. He says that it was itching and he just could not stop scratching it until it was open again. The wound on his plantar foot is smaller and has not accumulated a tremendous amount of callus. The lateral leg wound is shallower and the tunnel has also decreased in depth. There is just a little bit of slough accumulation on the surface. 11/30/2021: Another portion of his left medial thigh has  been opened up. All of these wounds are fairly superficial with just a little bit of slough and eschar accumulation. The wound on his plantar foot is almost closed with just a bit of eschar and periwound callus accumulation. The lateral leg wound is nearly flush with the surrounding skin and the tunnel is markedly shallower. 12/07/2021: There is just 1 open area on his left medial thigh. It is clean with just a little bit of perimeter eschar. The wound on his plantar foot continues to contract and just has some eschar and periwound callus accumulation. The lateral leg wound is closing at the more distal aspect and the tunnel is smaller. The surface is nearly flush with the surrounding skin and it has a good bed of granulation tissue. 12/14/2021: The thigh and foot wounds are closed. The lateral leg wound has closed over approximately half of its length. The tunnel continues to contract and the surface is now flush with the surrounding skin. The wound bed has robust granulation tissue. 12/22/2021: The thigh and foot wounds have reopened. The foot wound has a lot of callus accumulation around and over it. The thigh wound is tiny with just a little bit of slough in the wound bed. The lateral leg wound continues to contract. His vascular surgeon took the wound VAC off earlier in the week and the GRANVIL, SARAFIAN ([id number]) [id number] Page 14 of 20 patient has been doing wet-to-dry dressings. There is a little slough accumulation on the surface. The tunnel is about 3 cm in depth at this point. 12/28/2021: The thigh wound is closed again. The foot wound has some callus that subsequently has peeled back exposing just a small slit of a wound. The lateral leg wound Is down to about half the size that it originally was and the tunnel is down to about half a centimeter in depth. 01/04/2022: The thigh wound remains closed. The foot wound has heavy callus overlying the wound site. Once this  was debrided, the wound was found to be closed. The lateral leg wound is smaller again this week and very superficial. No tunnel could be identified. 01/12/2022: The thigh and foot wounds both remain closed. The lateral leg wound is now nearly flush with the skin surface. There is good granulation tissue present with a light layer of slough. 01/19/2022: Due to the  way his wrap was placed, the patient did not change the dressing on his thigh at all and so the foam was saturated and his skin is macerated. There is a light layer of slough on the wound surface. The underlying granulation tissue is robust and healthy-appearing. He has heavy callus buildup at the site of his first metatarsal head wound which is still healed. 02/01/2022: He has been in silver alginate. When he removed the dressing from his thigh wound, however, some leg, superficially reopening a portion of the wound that had healed. In addition, underneath the callus at his left first metatarsal head, there appears to be a blister and the wound appears to be open again. 02/08/2022: The lateral leg wound has contracted substantially. There is eschar and a light layer of slough present. He says that it is starting to pull and is uncomfortable. On inspection, there is some puckering of the scar and the eschar is quite dry; this may account for his symptoms. On his first metatarsal head, the wound is much smaller with just some eschar on the surface. The callus has not reaccumulated. He reports that he had a blister come up on his medial thigh wound at the distal aspect. It popped and there is now an opening in his skin again. Looking back through his library of wound photos, there is what looks like a permanent suture just deep to this location and it may be trying to erode through. We have been using silver alginate on his wounds. 02/15/2022: The lateral leg wound is about half the size it was last week. It is clean with just a little perimeter  eschar and light slough. The wound on his first metatarsal head is about the same with heavy callus overlying it. The medial thigh wound is closed again. He does have some skin changes on the top of his foot that looks potentially yeast related. 02/22/2022: The skin on the top of his foot improved with the use of a topical antifungal. The lateral leg wound continues to contract and is again smaller this week. There is a little bit of slough and eschar on the surface. The first metatarsal head wound is a little bit smaller but has reaccumulated a thick callus over the top. He decided to try to trim his toenail and ultimately took the entire nail off of his left great toe. 03/02/2022: His lateral leg wound continues to improve, as does the wound on his left great toe. Unfortunately, it appears that somehow his foot got wet and moisture seeped in through the opening causing his skin to lift. There is a large wound now overlying his first metatarsal on both the plantar, medial, and dorsal portion of his foot. There is necrotic tissue and slough present underneath the shaggy macerated skin. 03/08/2022: The lateral leg wound is smaller again today. There is just a light layer of slough and eschar on the surface. The great toe wound is smaller again today. The first metatarsal wound is a little bit smaller today and does not look nearly as necrotic and macerated. There is still slough and nonviable tissue present. 03/15/2022: The lateral leg wound is narrower and just has a little bit of light slough buildup. The first metatarsal wound still has a fair amount of moisture affecting the periwound skin. The great toe wound is healed. 03/22/2022: The lateral leg wound is now isolated to just at the level of his knee. There is some eschar and slough accumulation. The first metatarsal head  wound has epithelialized tremendously and is about half the size that it was last week. He still has some maceration on the top of  his foot and a fungal odor is present. 03/29/2022: T oday the patient's foot was macerated, suggesting that the cast got wet. The patient has also been picking at his dry skin and has enlarged the wound on his left lateral leg. In the time between having his cast removed and my evaluation, he had picked more dry skin and opened up additional wounds on his Achilles area and dorsal foot. The plantar first metatarsal head wound, however, is smaller and clean with just macerated callus around the perimeter and light slough on the surface. The lateral leg wound measured a little bit larger but is also fairly clean with eschar and minimal slough. 04/02/2022: The patient had vascular studies done last Friday and so his cast was not applied. He is here today to have that done. 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 04/05/2022: Both wounds are smaller and quite clean. There is just a little biofilm on the lateral leg wound. 10/20; the patient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

## 2023-01-18 NOTE — Progress Notes (Signed)
[name] ([id number]) [id number] Page 1 of 9 Visit Report for 01/17/2023 Arrival Information Details Patient Name: Date of Service: [name], [name] 01/17/2023 10:15 A M Medical Record Number: [id number] Patient Account Number: [id number] Date of Birth/Sex: Treating RN: Jan 02, 1951 (72 y.o. M) Primary Care Fredrika Canby: [name] Other Clinician: Referring Shaday Rayborn: Treating Goku Harb/Extender: [name] in Treatment: 70 Visit Information History Since Last Visit All ordered tests and consults were completed: No Patient Arrived: Ambulatory Added or deleted any medications: No Arrival Time: 10:29 Any new allergies or adverse reactions: No Accompanied By: self Had a fall or experienced change in No Transfer Assistance: None activities of daily living that may affect Patient Identification Verified: Yes risk of falls: Secondary Verification Process Completed: Yes Signs or symptoms of abuse/neglect since last visito No Patient Requires Transmission-Based Precautions: No Hospitalized since last visit: No Patient Has Alerts: Yes Implantable device outside of the clinic excluding No cellular tissue based products placed in the center since last visit: Pain Present Now: No Electronic Signature(s) Signed: 01/17/2023 11:57:11 AM By: Dayton Scrape Entered By: Dayton Scrape on 01/17/2023 10:29:48 -------------------------------------------------------------------------------- Compression Therapy Details Patient Name: Date of Service: [name]. 01/17/2023 10:15 A M Medical Record Number: [id number] Patient Account Number: [id number] Date of Birth/Sex: Treating RN: [birth year] (72 y.o. [name] Primary Care Sheriann Newmann: [name] Other Clinician: Referring Lynn Sissel: Treating Eddith Mentor/Extender: [name] in Treatment: 57 Compression Therapy Performed for Wound Assessment: Wound #18R Left,Plantar  Metatarsal head first Performed By: Clinician Brenton Grills, RN Compression Type: Four Layer Post Procedure Diagnosis Same as Pre-procedure Notes Scribed for Dr Lady Gary by Brenton Grills RN. Electronic Signature(s) Signed: 01/18/2023 12:49:07 PM By: Brenton Grills Entered By: Brenton Grills on 01/17/2023 10:56:11 [name] [id number] Page 2 of 9 -------------------------------------------------------------------------------- Encounter Discharge Information Details Patient Name: Date of Service: [name], [name] 01/17/2023 10:15 A M Medical Record Number: [id number] Patient Account Number: [id number] Date of Birth/Sex: Treating RN: 05/13/1951 (72 y.o. [name] Primary Care Letty Salvi: [name] Other Clinician: Referring Oreoluwa Gilmer: Treating Elizette Shek/Extender: [name] in Treatment: 28 Encounter Discharge Information Items Post Procedure Vitals Discharge Condition: Stable Temperature (F): 97.6 Ambulatory Status: Ambulatory Pulse (bpm): 76 Discharge Destination: Home Respiratory Rate (breaths/min): 18 Transportation: Private Auto Blood Pressure (mmHg): 142/65 Accompanied By: self Schedule Follow-up Appointment: Yes Clinical Summary of Care: Patient Declined Electronic Signature(s) Signed: 01/18/2023 12:49:07 PM By: Brenton Grills Entered By: Brenton Grills on 01/17/2023 11:16:17 -------------------------------------------------------------------------------- Lower Extremity Assessment Details Patient Name: Date of Service: [name]. 01/17/2023 10:15 A M Medical Record Number: [id number] Patient Account Number: [id number] Date of Birth/Sex: Treating RN: 23-Mar-1951 (72 y.o. [name] Primary Care Davaris Youtsey: [name] Other Clinician: Referring Sven Pinheiro: Treating Madylin Fairbank/Extender: [name] in Treatment: 92 Edema Assessment Assessed: [name]: No] [name]:  No] Edema: [Left: Ye] [Right: s] Calf Left: Right: Point of Measurement: 41 cm From Medial Instep 44 cm Ankle Left: Right: Point of Measurement: 10 cm From Medial Instep 27.9 cm Vascular Assessment Pulses: Dorsalis Pedis Palpable: [Left:Yes] Extremity colors, hair growth, and conditions: Hair Growth on Extremity: [Left:No] Temperature of Extremity: [Left:Warm] Capillary Refill: [Left:< 3 seconds] Dependent Rubor: [Left:No No] Toe Nail Assessment Left: Right: ThickCorinna Capra ([id number]) [id number] Page 3 of 9 Discolored: Yes Deformed: Yes Improper Length and Hygiene: Yes Electronic Signature(s) Signed: 01/18/2023 12:49:07 PM By: Brenton Grills Entered By: Brenton Grills on 01/17/2023 10:45:17 -------------------------------------------------------------------------------- Multi Wound Chart  Details Patient Name: Date of Service: [name], [name] 01/17/2023 10:15 A M Medical Record Number: [id number] Patient Account Number: [id number] Date of Birth/Sex: Treating RN: 1950-06-29 (72 y.o. M) Primary Care Tariya Morrissette: [name] Other Clinician: Referring Jaquil Todt: Treating Anjenette Gerbino/Extender: [name] in Treatment: 92 Vital Signs Height(in): 74 Capillary Blood Glucose(mg/dl): 409 Weight(lbs): 811 Pulse(bpm): 64 Body Mass Index(BMI): 30.6 Blood Pressure(mmHg): 143/88 Temperature(F): 99.1 Respiratory Rate(breaths/min): 20 [18R:Photos:] [N/A:N/A] Left, Plantar Metatarsal head first Medial Lower Leg N/A Wound Location: Gradually Appeared Gradually Appeared N/A Wounding Event: Diabetic Wound/Ulcer of the Lower Trauma, Other N/A Primary Etiology: Extremity Glaucoma, Sleep Apnea, Glaucoma, Sleep Apnea, N/A Comorbid History: Hypertension, Peripheral Arterial Hypertension, Peripheral Arterial Disease, Peripheral Venous Disease, Disease, Peripheral Venous Disease, Type II Diabetes, Gout, Osteoarthritis, Type II  Diabetes, Gout, Osteoarthritis, Neuropathy Neuropathy 08/23/2020 01/10/2023 N/A Date Acquired: 92 1 N/A Weeks of Treatment: Open Open N/A Wound Status: Yes No N/A Wound Recurrence: 2x1.5x0.2 0x0x0 N/A Measurements L x W x D (cm) 2.356 0 N/A A (cm) : rea 0.471 0 N/A Volume (cm) : 83.30% 100.00% N/A % Reduction in Area: 83.30% 100.00% N/A % Reduction in Volume: Grade 2 Full Thickness Without Exposed N/A Classification: Support Structures Medium Medium N/A Exudate Amount: Serosanguineous Serosanguineous N/A Exudate Type: red, brown red, brown N/A Exudate Color: Thickened Distinct, outline attached N/A Wound Margin: Large (67-100%) Medium (34-66%) N/A Granulation Amount: Red Red, Pink N/A Granulation Quality: Small (1-33%) Medium (34-66%) N/A Necrotic Amount: Fat Layer (Subcutaneous Tissue): Yes Fat Layer (Subcutaneous Tissue): Yes N/A Exposed Structures: Fascia: No Tendon: No Muscle: No Joint: No Bone: No Small (1-33%) Small (1-33%) N/A Epithelialization: Debridement - Selective/Open Wound N/A N/A Debridement: [name] ([id number]) [id number] Page 4 of 9 10:52 N/A N/A Pre-procedure Verification/Time Out Taken: Lidocaine 4% Topical Solution N/A N/A Pain Control: Necrotic/Eschar, Slough N/A N/A Tissue Debrided: Non-Viable Tissue N/A N/A Level: 2.36 N/A N/A Debridement A (sq cm): rea Curette N/A N/A Instrument: Minimum N/A N/A Bleeding: Pressure N/A N/A Hemostasis A chieved: 0 N/A N/A Procedural Pain: 0 N/A N/A Post Procedural Pain: Procedure was tolerated well N/A N/A Debridement Treatment Response: 2x1.5x0.2 N/A N/A Post Debridement Measurements L x W x D (cm) 0.471 N/A N/A Post Debridement Volume: (cm) Callus: Yes Scarring: Yes N/A Periwound Skin Texture: Rash: No Maceration: Yes Dry/Scaly: Yes N/A Periwound Skin Moisture: Dry/Scaly: No Maceration: No No Abnormalities Noted Hemosiderin Staining: Yes  N/A Periwound Skin Color: No Abnormality No Abnormality N/A Temperature: Compression Therapy N/A N/A Procedures Performed: Debridement Treatment Notes Electronic Signature(s) Signed: 01/17/2023 11:02:10 AM By: [name] MD FACS Entered By: [name] on 01/17/2023 11:02:10 -------------------------------------------------------------------------------- Multi-Disciplinary Care Plan Details Patient Name: Date of Service: [name]. 01/17/2023 10:15 A M Medical Record Number: [id number] Patient Account Number: [id number] Date of Birth/Sex: Treating RN: 1950-09-28 (72 y.o. [name] Primary Care Arshdeep Bolger: [name] Other Clinician: Referring Kelvis Berger: Treating Analee Montee/Extender: [name] in Treatment: 85 Multidisciplinary Care Plan reviewed with physician Active Inactive Venous Leg Ulcer Nursing Diagnoses: Knowledge deficit related to disease process and management Potential for venous Insuffiency (use before diagnosis confirmed) Goals: Patient will maintain optimal edema control Date Initiated: 07/27/2021 Target Resolution Date: 02/23/2023 Goal Status: Active Interventions: Assess peripheral edema status every visit. Treatment Activities: Therapeutic compression applied : 07/27/2021 Notes: Wound/Skin Impairment Nursing Diagnoses: Impaired tissue integrity Knowledge deficit related to ulceration/compromised skin integrity FILIPE, ZUMBACH ([id number]) [id number] Page 5 of 9 Goals: Patient will have a decrease in wound volume  by X% from date: (specify in notes) Date Initiated: 04/12/2021 Date Inactivated: 01/04/2022 Target Resolution Date: 04/23/2021 Goal Status: Met Patient/caregiver will verbalize understanding of skin care regimen Date Initiated: 01/04/2022 Target Resolution Date: 02/23/2023 Goal Status: Active Ulcer/skin breakdown will have a volume reduction of 30% by week 4 Date Initiated:  04/12/2021 Date Inactivated: 04/27/2021 Target Resolution Date: 04/27/2021 Goal Status: Unmet Unmet Reason: infection Ulcer/skin breakdown will have a volume reduction of 50% by week 8 Date Initiated: 04/27/2021 Date Inactivated: 06/29/2021 Target Resolution Date: 06/24/2021 Goal Status: Met Interventions: Assess patient/caregiver ability to obtain necessary supplies Assess patient/caregiver ability to perform ulcer/skin care regimen upon admission and as needed Assess ulceration(s) every visit Notes: Electronic Signature(s) Signed: 01/18/2023 12:49:07 PM By: Brenton Grills Entered By: Brenton Grills on 01/17/2023 10:46:17 -------------------------------------------------------------------------------- Pain Assessment Details Patient Name: Date of Service: [name]. 01/17/2023 10:15 A M Medical Record Number: [id number] Patient Account Number: [id number] Date of Birth/Sex: Treating RN: 11/24/1950 (72 y.o. M) Primary Care Banita Lehn: [name] Other Clinician: Referring Izabellah Dadisman: Treating Charels Stambaugh/Extender: [name] in Treatment: 49 Active Problems Location of Pain Severity and Description of Pain Patient Has Paino No Site Locations Pain Management and Medication Current Pain Management: Electronic Signature(s) Signed: 01/17/2023 11:57:11 AM By: Dayton Scrape Entered By: Dayton Scrape on 01/17/2023 10:31:34 [name] ([id number]) [id number] Page 6 of 9 -------------------------------------------------------------------------------- Patient/Caregiver Education Details Patient Name: Date of Service: VOYD, MEHNERT 7/25/2024andnbsp10:15 A M Medical Record Number: [id number] Patient Account Number: [id number] Date of Birth/Gender: Treating RN: [birth year] (72 y.o. [name] Primary Care Physician: [name] Other Clinician: Referring Physician: Treating Physician/Extender: [name]  in Treatment: 58 Education Assessment Education Provided To: Patient Education Topics Provided Wound/Skin Impairment: Methods: Explain/Verbal Responses: State content correctly Electronic Signature(s) Signed: 01/18/2023 12:49:07 PM By: Brenton Grills Entered By: Brenton Grills on 01/17/2023 10:48:27 -------------------------------------------------------------------------------- Wound Assessment Details Patient Name: Date of Service: [name]. 01/17/2023 10:15 A M Medical Record Number: [id number] Patient Account Number: [id number] Date of Birth/Sex: Treating RN: 06/20/1951 (72 y.o. M) Primary Care Arjen Deringer: [name] Other Clinician: Referring Deano Tomaszewski: Treating Genora Arp/Extender: [name] in Treatment: 92 Wound Status Wound Number: 18R Primary Diabetic Wound/Ulcer of the Lower Extremity Etiology: Wound Location: Left, Plantar Metatarsal head first Wound Open Wounding Event: Gradually Appeared Status: Date Acquired: 08/23/2020 Comorbid Glaucoma, Sleep Apnea, Hypertension, Peripheral Arterial Disease, Weeks Of Treatment: 92 History: Peripheral Venous Disease, Type II Diabetes, Gout, Osteoarthritis, Clustered Wound: No Neuropathy Photos Wound Measurements Length: (cm) 2 Kainz, Dola Factor ([id number]) Width: (cm) 1.5 Depth: (cm) 0.2 Area: (cm) 2.356 Volume: (cm) 0.471 % Reduction in Area: 83.3% [id number] Page 7 of 9 % Reduction in Volume: 83.3% Epithelialization: Small (1-33%) Wound Description Classification: Grade 2 Wound Margin: Thickened Exudate Amount: Medium Exudate Type: Serosanguineous Exudate Color: red, brown Foul Odor After Cleansing: No Slough/Fibrino Yes Wound Bed Granulation Amount: Large (67-100%) Exposed Structure Granulation Quality: Red Fascia Exposed: No Necrotic Amount: Small (1-33%) Fat Layer (Subcutaneous Tissue) Exposed: Yes Tendon Exposed: No Muscle Exposed: No Joint Exposed:  No Bone Exposed: No Periwound Skin Texture Texture Color No Abnormalities Noted: No No Abnormalities Noted: Yes Callus: Yes Temperature / Pain Temperature: No Abnormality Moisture No Abnormalities Noted: No Dry / Scaly: No Maceration: Yes Treatment Notes Wound #18R (Metatarsal head first) Wound Laterality: Plantar, Left Cleanser Soap and Water Discharge Instruction: May shower and wash wound with dial antibacterial soap and water prior to dressing change. Wound Cleanser Discharge  Instruction: Cleanse the wound with wound cleanser prior to applying a clean dressing using gauze sponges, not tissue or cotton balls. Peri-Wound Care Ketoconazole Cream 2% Discharge Instruction: Apply Ketoconazole as directed Triamcinolone 15 (g) Discharge Instruction: Use triamcinolone 15 (g) as directed Topical Gentamicin Discharge Instruction: As directed by physician Mupirocin Ointment Discharge Instruction: Apply Mupirocin (Bactroban) as instructed Primary Dressing Promogran Prisma Matrix, 4.34 (sq in) (silver collagen) Discharge Instruction: Moisten collagen with saline or hydrogel Secondary Dressing Optifoam Non-Adhesive Dressing, 4x4 in Discharge Instruction: Apply over primary dressing as directed. Woven Gauze Sponges 2x2 in Discharge Instruction: Apply over primary dressing as directed. Zetuvit Absorbent Pad, 4x4 (in/in) Secured With Compression Wrap Urgo K2, (equivalent to a 4 layer) two layer compression system, regular Discharge Instruction: Apply Urgo K2 as directed (alternative to 4 layer compression). Compression Stockings Add-Ons MILOH, SPILDE ([id number]) [id number] Page 8 of 9 Electronic Signature(s) Signed: 01/17/2023 11:57:11 AM By: Dayton Scrape Entered By: Dayton Scrape on 01/17/2023 10:42:26 -------------------------------------------------------------------------------- Wound Assessment Details Patient Name: Date of Service: [name], [name]  01/17/2023 10:15 A M Medical Record Number: [id number] Patient Account Number: [id number] Date of Birth/Sex: Treating RN: July 22, 1950 (72 y.o. [name] Primary Care Tulani Kidney: [name] Other Clinician: Referring Kameelah Minish: Treating Reve Crocket/Extender: [name] in Treatment: 92 Wound Status Wound Number: 35 Primary Trauma, Other Etiology: Wound Location: Medial Lower Leg Wound Open Wounding Event: Gradually Appeared Status: Date Acquired: 01/10/2023 Comorbid Glaucoma, Sleep Apnea, Hypertension, Peripheral Arterial Disease, Weeks Of Treatment: 1 History: Peripheral Venous Disease, Type II Diabetes, Gout, Osteoarthritis, Clustered Wound: No Neuropathy Photos Wound Measurements Length: (cm) Width: (cm) Depth: (cm) Area: (cm) Volume: (cm) 0 % Reduction in Area: 100% 0 % Reduction in Volume: 100% 0 Epithelialization: Small (1-33%) 0 0 Wound Description Classification: Full Thickness Without Exposed Suppor Wound Margin: Distinct, outline attached Exudate Amount: Medium Exudate Type: Serosanguineous Exudate Color: red, brown t Structures Foul Odor After Cleansing: No Slough/Fibrino No Wound Bed Granulation Amount: Medium (34-66%) Exposed Structure Granulation Quality: Red, Pink Fat Layer (Subcutaneous Tissue) Exposed: Yes Necrotic Amount: Medium (34-66%) Periwound Skin Texture Texture Color No Abnormalities Noted: No No Abnormalities Noted: No Rash: No Hemosiderin Staining: Yes Scarring: Yes Temperature / Pain Temperature: No Abnormality Moisture No Abnormalities Noted: No Dry / Scaly: Yes Maceration: No HTOO, KANIECKI ([id number]) [id number] Page 9 of 9 Electronic Signature(s) Signed: 01/18/2023 12:49:07 PM By: Brenton Grills Entered By: Brenton Grills on 01/17/2023 10:52:13 -------------------------------------------------------------------------------- Vitals Details Patient Name: Date of  Service: [name]. 01/17/2023 10:15 A M Medical Record Number: [id number] Patient Account Number: [id number] Date of Birth/Sex: Treating RN: 1950/12/05 (72 y.o. M) Primary Care Elvyn Krohn: [name] Other Clinician: Referring Favio Moder: Treating Jazline Cumbee/Extender: [name] in Treatment: 92 Vital Signs Time Taken: 10:29 Temperature (F): 99.1 Height (in): 74 Pulse (bpm): 64 Weight (lbs): 238 Respiratory Rate (breaths/min): 20 Body Mass Index (BMI): 30.6 Blood Pressure (mmHg): 143/88 Capillary Blood Glucose (mg/dl): 595 Reference Range: 80 - 120 mg / dl Electronic Signature(s) Signed: 01/17/2023 11:57:11 AM By: Dayton Scrape Entered By: Dayton Scrape on 01/17/2023 10:31:29

## 2023-01-24 ENCOUNTER — Encounter (HOSPITAL_BASED_OUTPATIENT_CLINIC_OR_DEPARTMENT_OTHER): Payer: Medicare Other | Attending: General Surgery | Admitting: General Surgery

## 2023-01-24 DIAGNOSIS — N183 Chronic kidney disease, stage 3 unspecified: Secondary | ICD-10-CM | POA: Insufficient documentation

## 2023-01-24 DIAGNOSIS — M1990 Unspecified osteoarthritis, unspecified site: Secondary | ICD-10-CM | POA: Diagnosis not present

## 2023-01-24 DIAGNOSIS — I890 Lymphedema, not elsewhere classified: Secondary | ICD-10-CM | POA: Insufficient documentation

## 2023-01-24 DIAGNOSIS — H409 Unspecified glaucoma: Secondary | ICD-10-CM | POA: Insufficient documentation

## 2023-01-24 DIAGNOSIS — L97528 Non-pressure chronic ulcer of other part of left foot with other specified severity: Secondary | ICD-10-CM | POA: Diagnosis not present

## 2023-01-24 DIAGNOSIS — E1122 Type 2 diabetes mellitus with diabetic chronic kidney disease: Secondary | ICD-10-CM | POA: Diagnosis not present

## 2023-01-24 DIAGNOSIS — E11621 Type 2 diabetes mellitus with foot ulcer: Secondary | ICD-10-CM | POA: Diagnosis present

## 2023-01-24 DIAGNOSIS — I129 Hypertensive chronic kidney disease with stage 1 through stage 4 chronic kidney disease, or unspecified chronic kidney disease: Secondary | ICD-10-CM | POA: Insufficient documentation

## 2023-01-24 DIAGNOSIS — L97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: Secondary | ICD-10-CM | POA: Insufficient documentation

## 2023-01-24 DIAGNOSIS — I872 Venous insufficiency (chronic) (peripheral): Secondary | ICD-10-CM | POA: Diagnosis not present

## 2023-01-24 DIAGNOSIS — Z794 Long term (current) use of insulin: Secondary | ICD-10-CM | POA: Insufficient documentation

## 2023-01-24 DIAGNOSIS — G4730 Sleep apnea, unspecified: Secondary | ICD-10-CM | POA: Diagnosis not present

## 2023-01-24 DIAGNOSIS — Z833 Family history of diabetes mellitus: Secondary | ICD-10-CM | POA: Insufficient documentation

## 2023-01-24 DIAGNOSIS — E1140 Type 2 diabetes mellitus with diabetic neuropathy, unspecified: Secondary | ICD-10-CM | POA: Diagnosis not present

## 2023-01-24 DIAGNOSIS — E1151 Type 2 diabetes mellitus with diabetic peripheral angiopathy without gangrene: Secondary | ICD-10-CM | POA: Insufficient documentation

## 2023-01-24 NOTE — Progress Notes (Signed)
[name], [name] ([id number]) [id number] Page 1 of 21 Visit Report for 01/24/2023 Debridement Details Patient Name: Date of Service: [name], [name] 01/24/2023 8:30 A M Medical Record Number: [id number] Patient Account Number: [id number] Date of Birth/Sex: Treating RN: 27-Apr-1951 (72 y.o. [name] Primary Care Provider: [name] Other Clinician: Referring Provider: Treating Provider/Extender: [name] in Treatment: 93 Debridement Performed for Assessment: Wound #22R Left,Proximal,Lateral Lower Leg Performed By: Physician [name], MD Debridement Type: Debridement Level of Consciousness (Pre-procedure): Awake and Alert Pre-procedure Verification/Time Out Yes - 08:56 Taken: Start Time: 08:56 Pain Control: Lidocaine 4% Topical Solution Percent of Wound Bed Debrided: 100% T Area Debrided (cm): otal 1.41 Tissue and other material debrided: Non-Viable, Eschar, Slough, Slough Level: Non-Viable Tissue Debridement Description: Selective/Open Wound Instrument: Curette Bleeding: Minimum Hemostasis Achieved: Pressure Response to Treatment: Procedure was tolerated well Level of Consciousness (Post- Awake and Alert procedure): Post Debridement Measurements of Total Wound Length: (cm) 1.5 Width: (cm) 1.2 Depth: (cm) 0.1 Volume: (cm) 0.141 Character of Wound/Ulcer Post Debridement: Improved Post Procedure Diagnosis Same as Pre-procedure Notes scribed for Dr. Lady Gary by [name], RN Electronic Signature(s) Signed: 01/24/2023 12:27:09 PM By: [name] MD FACS Signed: 01/24/2023 3:38:42 PM By: [name] Entered By: [name] on 01/24/2023 08:57:54 -------------------------------------------------------------------------------- Debridement Details Patient Name: Date of Service: [name] Grapes. 01/24/2023 8:30 A M Medical Record Number: [id number] Patient Account Number: [id number] Date  of Birth/Sex: Treating RN: 25-Aug-1950 (72 y.o. [name] Primary Care Provider: [name] Other Clinician: Referring Provider: Treating Provider/Extender: [name] in Treatment: 66 Debridement Performed for Assessment: Wound #18R Left,Plantar Metatarsal head first Performed By: Physician [name], MD Debridement Type: Debridement [name] ([id number]) [id number] Page 2 of 21 Severity of Tissue Pre Debridement: Fat layer exposed Level of Consciousness (Pre-procedure): Awake and Alert Pre-procedure Verification/Time Out Yes - 08:56 Taken: Start Time: 08:56 Pain Control: Lidocaine 4% Topical Solution Percent of Wound Bed Debrided: 100% T Area Debrided (cm): otal 2.76 Tissue and other material debrided: Non-Viable, Callus, Slough, Slough Level: Non-Viable Tissue Debridement Description: Selective/Open Wound Instrument: Curette Bleeding: Minimum Hemostasis Achieved: Pressure Response to Treatment: Procedure was tolerated well Level of Consciousness (Post- Awake and Alert procedure): Post Debridement Measurements of Total Wound Length: (cm) 1.6 Width: (cm) 2.2 Depth: (cm) 0.2 Volume: (cm) 0.553 Character of Wound/Ulcer Post Debridement: Improved Severity of Tissue Post Debridement: Fat layer exposed Post Procedure Diagnosis Same as Pre-procedure Notes scribed for Dr. Lady Gary by [name], RN Electronic Signature(s) Signed: 01/24/2023 12:27:09 PM By: [name] MD FACS Signed: 01/24/2023 3:38:42 PM By: [name] Entered By: [name] on 01/24/2023 09:00:12 -------------------------------------------------------------------------------- HPI Details Patient Name: Date of Service: [name] Grapes. 01/24/2023 8:30 A M Medical Record Number: [id number] Patient Account Number: [id number] Date of Birth/Sex: Treating RN: 01/03/1951 (72 y.o. M) Primary Care Provider: [name] Other Clinician: Referring Provider: Treating Provider/Extender: [name] in Treatment: 42 History of Present Illness HPI Description: 10/11/17; Mr. Manas is a 72 year old man who tells me that in 2015 he slipped down the latter traumatizing his left leg. He developed a wound in the same spot the area that we are currently looking at. He states this closed over for the most part although he always felt it was somewhat unstable. In 2016 he hit the same area with the door of his car had this reopened. He tells me that this is never really closed although sometimes an inflow  it remains open on a constant basis. He has not been using any specific dressing to this except for topical antibiotics the nature of which were not really sure. His primary doctor did send him to see Dr. Jacinto Halim of interventional cardiology. He underwent an angiogram on 08/06/17 and he underwent a PTA and directional atherectomy of the lesser distal SFA and popliteal arteries which resulted in brisk improvement in blood flow. It was noted that he had 2 vessel runoff through the anterior tibial and peroneal. He is also been to see vascular and interventional radiologist. He was not felt to have any significant superficial venous insufficiency. Presumably is not a candidate for any ablation. It was suggested he come here for wound care. The patient is a type II diabetic on insulin. He also has a history of venous insufficiency. ABIs on the left were noncompressible in our clinic 10/21/17; patient we admitted to the clinic last week. 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 We put Iodosorb on him after an aggressive debridement and 3 layer compression. He complained of pain in his ankle and itching with is skin in fact he scratched the area on the medial calf superiorly at the rim of our wraps and he has 2 small open areas in that location  today which are new. I changed his primary dressing today to silver collagen. As noted he is already had revascularization and does not have any significant superficial venous insufficiency that would be amenable to ablation 10/28/17; patient admitted to the clinic 2 weeks ago. He has a smaller Wound. Scratch injury from last week revealed. There is large wound over the tibial area. This is smaller. Granulation looks healthy. No need for debridement. [name], [name] ([id number]) [id number] Page 3 of 21 11/04/17; the wound on the left lateral calf looks better. Improved dimensions. Surface of this looks better. We've been maintaining him and Kerlix Coban wraps. He finds this much more comfortable. Silver collagen dressing 11/11/17; left lateral Wound continues to look healthy be making progress. Using a #5 curet I removed removed nonviable skin from the surface of the wound and then necrotic debris from the wound surface. Surface of the wound continues to look healthy. He also has an open area on the left great toenail bed. We've been using topical antibiotics. 11/19/17; left anterior lateral wound continues to look healthy but it's not closed. He also had a small wound above this on the left leg Initially traumatic wounds in the setting of significant chronic venous insufficiency and stasis dermatitis 11/25/17; left anterior wounds superiorly is closed still a small wound inferiorly. 12/02/17; left anterior tibial area. Arrives today with adherent callus. Post debridement clearly not completely closed. Hydrofera Blue under 3 layer compression. 12/09/17; left anterior tibia. Circumferential eschar however the wound bed looks stable to improved. We've been using Hydrofera Blue under 3 layer compression 12/17/17; left anterior tibia. Apparently this was felt to be closed however when the wrap was taken off there is a skin tear to reopen wounds in the same area we've been using  Hydrofera Blue under 3 layer compression 12/23/17 left anterior tibia. Not close to close this week apparently the [facility] was stuck to this again. Still circumferential eschar requiring debridement. I put a contact layer on this this time under the Hydrofera Blue 12/31/17; left anterior tibia. Wound is better slight amount of hyper-granulation. Using Hydrofera Blue over Adaptic. 01/07/18; left anterior tibia.  The wound had some surface eschar however after this was removed he has no open wound.he was already revascularized by Dr. Jacinto Halim when he came to our clinic with atherectomy of the left SFA and popliteal artery. He was also sent to interventional radiology for venous reflux studies. He was not felt to have significant reflux but certainly has chronic venous changes of his skin with hemosiderin deposition around this area. He will definitely need to lubricate his skin and wear compression stocking and I've talked to him about this. READMISSION 05/26/2018 This is a now 72 year old man we cared for with traumatic wounds on his left anterior lower extremity. He had been previously revascularized during that admission by Dr. Jacinto Halim. Apparently in follow-up Dr. Jacinto Halim noted that he had deterioration in his arterial status. He underwent a stent placement in the distal left SFA on 04/22/2018. 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 He went back to the angiography suite on 04/30/2018 he underwent PTA and balloon angioplasty of the occluded left mid anterior tibial artery, thrombotic occlusion went from 100 to 0% which reconstitutes the posterior tibial artery. He had thrombectomy and aspiration of the peroneal artery. The stent placed in the distal SFA left SFA was still occluded. 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 He has been recuperating at [facility] in [facility] after the most recent hospitalization. He is going home tomorrow. He tells me that 2 weeks ago he traumatized the tip of his left fifth toe. He came in urgently for our review of this. This was a history of before I noted that Dr. Jacinto Halim had already noted dry gangrenous changes of the left fifth toe 06/09/2018; 2-week follow-up. I did contact Dr. Jacinto Halim after his last appointment and he apparently saw 1 of Dr. Verl Dicker colleagues the next day. He does not follow-up with Dr. Jacinto Halim himself until Thursday of this week. 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 Nevertheless there is no evidence of infection no drainage and no pain. He had a new area that this week when we were signing him in today on the left anterior mid tibia area, this is in close proximity to the previous wound we have dealt with in this clinic. 06/23/2018; 2-week follow-up. I did not receive a recent note from Dr. Jacinto Halim to review today. Our office is trying to obtain this. He is apparently not planning to do further vascular interventions and wondered about compression to try and help with the patient's chronic venous insufficiency. However we are also concerned about the arterial flow. He arrives in clinic today with a new area on the left third toe. The areas on the calf/anterior tibia are close to closing. The left fifth toe is still mummified using Betadine. -In reviewing things with the patient he has what sounds like claudication with mild to moderate amount of activity. 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 I prescribed Levaquin over the phone while we attempted to arrange a plan of care. However the patient called yesterday to report he had low-grade fever and he came in today acutely. There is been a marked  deterioration in the left third toe with spreading cellulitis  up into the dorsal left foot. He was referred to the emergency room. Readmission: 06/29/2020 patient presents today for reevaluation here in our clinic he was previously treated by Dr. Leanord Hawking at the latter part of 2019 in 2 the beginning of 2020. Subsequently we have not seen him since that time in the interim he did have evaluation with vein and vascular specialist specifically Dr. Bo Mcclintock who did perform quite extensive work for a left femoral to anterior tibial artery bypass. With that being said in the interim the patient has developed significant lymphedema and has wounds that he tells me have really never healed in regard to the incision site on the left leg. He also has multiple wounds on the feet for various reasons some of which is that he tends to pick at his feet. Fortunately there is no signs of active infection systemically at this time he does have some wounds that are little bit deeper but most are fairly superficial he seems to have good blood flow and overall everything appears to be healthy I see no bone exposed and no obvious signs of osteomyelitis. I do not know that he necessarily needs a x-ray at this point although that something we could consider depending on how things progress. The patient does have a history of lymphedema, diabetes, this is type II, chronic kidney disease stage III, hypertension, and history of peripheral vascular disease. 07/05/2020; patient admitted last week. Is a patient I remember from 2019 he had a spreading infection involving the left foot and we sent him to the hospital. 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 He subsequently had a left femoral to anterior tibial bypass by Dr.Cain vein and vascular. He also has severe lymphedema with chronic skin changes related to that on the left leg. The most problematic area that was new today was on the  left medial great toe. This was apparently a small area last week there was purulent drainage which our intake nurse cultured. Also areas on the left medial foot and heel left lateral foot. He has 2 areas on the left medial calf left lateral calf in the setting of the severe lymphedema. 07/13/2020 on evaluation today patient appears to be doing better in my opinion compared to his last visit. The good news is there is no signs of active infection systemically and locally I do not see any signs of infection either. He did have an x-ray which was negative that is great news he had a culture which showed MRSA but at the same time he is been on the doxycycline which has helped. I do think we may want to extend this for 7 additional days 1/25; patient admitted to the clinic a few weeks ago. 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 We have been putting him under compression his edema control is a lot better but he is severe verricused skin on the left leg. He is really done quite well he still has an open area on the left medial calf and the left medial first metatarsal head. We have been using silver collagen on the leg silver alginate on the foot 07/27/2020 upon evaluation today patient appears to be doing decently well in regard to his wounds. He still has a lot of dry skin on the left leg. Some of this is starting to peel back and I think he may be able to have them out  by removing some that today. Fortunately there is no signs of active infection at this time on the left leg although on the right leg he does appear to have swelling and erythema as well as some mild warmth to touch. This does have been concerned about the possibility of cellulitis although within the differential diagnosis I do think that potentially a DVT has to be at least considered. We need to rule that out before proceeding would just call in the cellulitis. Especially since he is having  pain in the posterior aspect of his calf muscle. 2/8; the patient had seen sparingly. He has severe skin changes of chronic lymphedema in the left leg thickened hyperkeratotic verrucous skin. He has an open wound on the medial part of the left first met head left mid tibia. He also has a rim of nonepithelialized skin in the anterior mid tibia. He brought in the AmLactin lotion that was been prescribed although I am not sure under compression and its utility. There concern about cellulitis on the right lower leg the last time he was here. He was put on on antibiotics. His DVT rule out was negative. The right leg looks fine he is using his stocking on this area 08/10/2020 upon evaluation today patient appears to be doing well with regard to his leg currently. He has been tolerating the dressing changes without complication. Fortunately there is no signs of active infection which is great news. Overall very pleased with where things stand. 2/22; the patient still has an area on the medial part of the left first met his head. This looks better than when I last saw this earlier this month he has a rim of [name], [name] ([id number]) [id number] Page 4 of 21 epithelialization but still some surface debris. Mostly everything on the left leg is healed. There is still a vulnerable in the left mid tibia area. 08/30/2020 upon evaluation today patient appears to be doing much better in regard to his wounds on his foot. 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 Nonetheless I think we will address that today I Minna send in a prescription for him in that regard. Overall though there does not appear to be any signs of significant worsening. 09/07/2020 on evaluation today patient's wounds over his left foot appear to be doing excellent. I do not see any signs  of infection there is some callus buildup this can require debridement for certain but overall I feel like he is managing quite nicely. He still using the AmLactin cream which has been beneficial for him as well. 3/22; left foot wound is closed. There is no open area here. He is using ammonium lactate lotion to the lower extremities to help exfoliate dry cracked skin. He has compression stockings from elastic therapy in [place]. The wound on the medial part of his left first met head is healed today. READMISSION 04/12/2021 Mr. Burgher is a patient we know fairly well he had a prolonged stay in clinic in 2019 with wounds on his left lateral and left anterior lower extremity in the setting of chronic venous insufficiency. More recently he was here earlier this year with predominantly an area on his left foot first metatarsal head plantar and he says the plantar foot broke down on its not long after we discharged him but he did not come back here. The last  few months areas of broken down on his left anterior and again the left lateral lower extremity. The leg itself is very swollen chronically enlarged a lot of hyperkeratotic dry Berry Q skin in the left lower leg. His edema extends well into the thigh. He was seen by Dr. Randie Heinz. He had ABIs on 03/02/2021 showing an ABI on the right of 1 with a TBI of 0.72 his ABI in the left at 1.09 TBI of 0.99. Monophasic and biphasic waveforms on the right. On the left monophasic waveforms were noted he went on to have an angiogram on 03/27/2021 this showed the aortic aortic and iliac segments were free of flow-limiting stenosis the left common femoral vein to evaluate the left femoral to anterior tibial artery bypass was unobstructed the bypass was patent without any areas of stenosis. We discharged the patient in bilateral juxta lite stockings but very clearly that was not sufficient to control the swelling and maintain skin integrity. He is clearly going to need  compression pumps. 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 This is fortunate because he is on his feet for long periods of time. 10/27; patient comes in with our intake nurse reporting copious amount of green drainage from the left anterior mid tibia the left dorsal foot and to a lesser extent the left medial mid tibia. We left the compression wrap on all week for the amount of edema in his left leg is quite a bit better. We use silver alginate as the primary dressing 11/3; edema control is good. Left anterior lower leg left medial lower leg and the plantar first metatarsal head. The left anterior lower leg required debridement. Deep tissue culture I did of this wound showed MRSA I put him on 10 days of doxycycline which she will start today. We have him in compression wraps. He has a security card and [employer] however he is retiring on November 15. We will need to then get him into a better offloading boot for the left foot perhaps a total contact cast 11/10; edema control is quite good. Left anterior and left medial lower leg wounds in the setting of chronic venous insufficiency and lymphedema. He also has a substantial area over the left plantar first metatarsal head. 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 He has significant hypergranulation on the left plantar foot wound. The patient is a diabetic but he does not have significant PAD 11/17; edema control is quite good. Left anterior and left medial lower leg wounds look better. The really concerning area remains the area on the left plantar first metatarsal head. He has a rim of epithelialization. 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 Starting today with a forefoot off loader but . possibly a  total contact cast. 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 Arrives with a very significant odor from I think all of the wounds on the left leg and the left foot. Because of this I did not put a total contact cast on him today but will could still consider this. His wife  was having cataract surgery which is the reason he missed the appointment 12/6. I saw this man 5 days ago with a swelling below the popliteal fossa. I thought he actually might have a Baker's cyst however the DVT rule out study that we could arrange right away was negative the technician told me this was not a ruptured Baker's cyst. We attempted to get this aspirated by under ultrasound guidance in interventional radiology however all they did was an ultrasound however it shows an extensive fluid collection 62 x 8 x 9.4 in the left thigh and left calf. The patient states he thinks this started 8 days ago or so but he really is not complaining of any pain, fever or systemic symptoms. He has not ha 12/20; after some difficulty I managed to get the patient into see Dr. Randie Heinz. Eventually he was taken into the hospital and had a drain put in the fluid collection below his left knee posteriorly extending into the posterior thigh. He still has the drain in place. Culture of this showed moderate staff aureus few Morganella and few Klebsiella he is now on doxycycline and ciprofloxacin as suggested by infectious disease he is on this for a month. The drain will remain in place until it stops draining 12/29; he comes in today with the 1 wound on his left leg and the area on the left plantar first met head significantly smaller. Both look healthy. He still has the drain in the left leg. He says he has to change this daily. Follows up with Dr. Randie Heinz on January 11. 06/29/2021; the wounds that I am following on the left leg and left  first met head continued to be quite healthy. However the area where his inferior drain is in place had copious amounts of drainage which was green in color. The wound here is larger. Follows up with Dr. Pascal Lux of vein and vascular his surgeon next week as well as infectious disease. He remains on ciprofloxacin and doxycycline. He is not complaining of excessive pain in either one of the drain areas 1/12; the patient saw vascular surgery and infectious disease. Vascular surgery has left the drain in place as there was still some notable drainage still see him back in 2 weeks. Dr. Dorthula Perfect stop the doxycycline and ciprofloxacin and I do not believe he follows up with them at this point. Culture I did last week showed both doxycycline resistant MRSA and Pseudomonas not sensitive to ciprofloxacin although only in rare titers 1/19; the patient's wound on the left anterior lower leg is just about healed. We have continued healing of the area that was medially on the left leg. Left first plantar metatarsal head continues to get smaller. The major problem here is his 2 drain sites 1 on the left upper calf and lateral thigh. There is purulent drainage still from the left lateral thigh. I gave him antibiotics last week but we still have recultured. He has the drain in the area I think this is eventually going to have to come out. I suspect there will be a connecting wound to heal here perhaps with improved VAc 1/26; the patient had his drain removed by vein and vascular on 1/25/. This was a large pocket of fluid in his left thigh that seem to tunnel into his left upper calf. 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 His mention his Penrose drain was removed today. He now has a tunneling wound on his left calf and left  thigh. Both of these probe widely towards each other although I cannot really prove that they connect. Both wounds on his lower leg anteriorly are closed and his area over the first  metatarsal head on his right foot continues to improve. We are using Hydrofera Blue here. He also saw infectious disease culture of the abscess they noted was polymicrobial with MRSA, Morganella and Klebsiella he was treated with doxycycline and ciprofloxacin for 4 weeks ending on 07/03/2021. They did not recommend any further antibiotics. Notable that while he still had the Penrose drain in place last week he had purulent drainage coming out of the inferior IandD site this grew [facility], MRSA and Pseudomonas but there does not appear to be any active infection in this area today with the drain out and he is not systemically unwell 2/2; with regards to the drain sites the superior one on the thigh actually is closed down the one on the upper left lateral calf measures about 8 and half RHODES, TWOREK ([id number]) [phone].pdf Page 5 of 21 centimeters which is an improvement seems to be less prominent although still with a lot of drainage. The only remaining wound is over the first metatarsal head on the left foot and this looks to be continuing to improve with Hydrofera Blue. 2/9; the area on his plantar left foot continues to contract. Callus around the wound edge. The drain sites specifically have not come down in depth. We put the wound VAC on Monday he changed the canister late last night our intake nurse reported a pocket of fluid perhaps caused by our compression wraps 2/16; continued improvement in left foot plantar wound. drainage site in the calf is not improved in terms of depth (wound vac) 2/23; continued improvement in the left foot wound over the first metatarsal head. With regards to the drain sites the area on his thigh laterally is healed however the open area on his calf is small in terms of circumference by still probes in by about 15 cm. Within using the wound VAC. Hydrofera Blue on his foot 08/24/2021: The left first metatarsal head wound continues to  improve. The wound bed is healthy with just some surrounding callus. Unfortunately the open drain site on his calf remains open and tunnels at least 15 cm (the extent of a Q-tip). This is despite several weeks of wound VAC treatment. Based on reading back through the notes, there has been really no significant change in the depth of the wound, although the orifice is smaller and the more cranial wound on his thigh has closed. I suspect the tunnel tracks nearly all the way to this location. 08/31/2021: Continued improvement in the left first metatarsal head wound. There has been absolutely no improvement to the long tunnel from his open drain site on his calf. We have tried to get him into see vascular surgery sooner to consider the possibility of simply filleting the tract open and allowing it to heal from the bottom up, likely with a wound VAC. They have not yet scheduled a sooner appointment than his current mid April 09/14/2021: He was seen by vascular surgery and they took him to the operating room last week. They opened a portion of the tunnel, but did not extend the entire length of the known open subcutaneous tract. I read Dr. Darcella Cheshire operative note and it is not clear from that documentation why only a portion of the tract was opened. The heaped up granulation tissue was curetted and removed from  at least some portion of the tract. They did place a wound VAC and applied an Unna boot to the leg. The ulcer on his left first metatarsal head is smaller today. The bed looks good and there is just a small amount of surrounding callus. 09/21/2021: The ulcer on his left first metatarsal head looks to be stalled. There is some callus surrounding the wound but the wound bed itself does not appear particularly dynamic. The tunnel tract on his lateral left leg seems to be roughly the same length or perhaps slightly smaller but the wound bed appears healthy with good granulation tissue. He opened up a new wound  on his medial thigh and the site of a prior surgical incision. He says that he did this unconsciously in his sleep by scratching. 09/28/2021: Unfortunately, the ulcer on his left first metatarsal head has extended underneath the callus toward the dorsum of his foot. The medial thigh wounds are roughly the same. The tunnel on his lateral left leg continues to be problematic; it is longer than we are able to actually probe with a Q-tip. I am still not certain as to why Dr. Randie Heinz did not open this up entirely when he took the patient to the operating room. We will likely be back in the same situation with just a small superficial opening in a long unhealed tract, as the open portion is granulating in nicely. 10/02/2021: The patient was initially scheduled for a nurse visit, but we are also applying a total contact cast today. The plantar foot wound looks clean without significant accumulated callus. We have been applying Prisma silver collagen to the site. 10/05/2021: The patient is here for his first total contact cast change. We have tried using gauze packing strips in the tunnel on his lateral leg wound, but this does not seem to be working any better than the white VAC foam. The foot ulcer looks about the same with minimal periwound callus. Medial thigh wound is clean with just some overlying eschar. 10/12/2021: The plantar foot wound is stable without any significant accumulation of periwound callus. The surface is viable with good granulation tissue. The medial thigh wounds are much smaller and are epithelializing. On the other hand, he had purulent drainage coming from the tunnel on his lateral leg. He does go back to see Dr. Randie Heinz next week and is planning to ask him why the wound tunnel was not completely opened at the time of his most recent operation. 10/19/2021: The plantar foot wound is markedly improved and has epithelial tissue coming through the surface. The medial thigh wounds are nearly closed  with just a tiny open area. He did see Dr. Randie Heinz earlier this week and apparently they did discuss the possibility of opening the sinus tract further and enabling a wound VAC application. Apparently there are some limits as to what Dr. 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 I think if we could get the tract open to the level of the popliteal fossa, this would greatly aid in her ability to get this chart closed. That being said, however, today when I probed the tract with a Q-tip, I was not able to insert the entirety of the Q-tip as I have on previous occasions. The tunnel is shorter by about 4 cm. The surface is clean with good granulation tissue and no further episodes of purulent drainage. 10/30/2021: Last week, the patient underwent surgery and had the long tract in his leg opened. There  was a rind that was debrided, according to the operative report. His medial thigh ulcers are closed. The plantar foot wound is clean with a good surface and some built up surrounding callus. 11/06/2021: The overall dimensions of the large wound on his lateral leg remain about the same, but there is good granulation tissue present and the tunneling is a little bit shorter. He has a new wound on his anterior tibial surface, in the same location where he had a similar lesion in the past. The plantar foot wound is clean with some buildup surrounding callus. Just toward the medial aspect of his foot, however, there is an area of darkening that once debrided, revealed another opening in the skin surface. 11/13/2021: The anterior tibial surface wound is closed. The plantar foot wound has some surrounding callus buildup. The area of darkening that I debrided last week and revealed an opening in the skin surface has closed again. The tunnel in the large wound on his lateral leg has come in by about 3 cm. There is healthy granulation tissue on the entire wound surface. 11/23/2021: The  patient was out of town last week and did wet-to-dry dressings on his large wound. 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 Unfortunately, he picked open the wound on his left medial thigh. He says that it was itching and he just could not stop scratching it until it was open again. The wound on his plantar foot is smaller and has not accumulated a tremendous amount of callus. The lateral leg wound is shallower and the tunnel has also decreased in depth. There is just a little bit of slough accumulation on the surface. 11/30/2021: Another portion of his left medial thigh has been opened up. All of these wounds are fairly superficial with just a little bit of slough and eschar accumulation. The wound on his plantar foot is almost closed with just a bit of eschar and periwound callus accumulation. The lateral leg wound is nearly flush with the surrounding skin and the tunnel is markedly shallower. 12/07/2021: There is just 1 open area on his left medial thigh. It is clean with just a little bit of perimeter eschar. The wound on his plantar foot continues to contract and just has some eschar and periwound callus accumulation. The lateral leg wound is closing at the more distal aspect and the tunnel is smaller. The surface is nearly flush with the surrounding skin and it has a good bed of granulation tissue. 12/14/2021: The thigh and foot wounds are closed. The lateral leg wound has closed over approximately half of its length. The tunnel continues to contract and the surface is now flush with the surrounding skin. The wound bed has robust granulation tissue. 12/22/2021: The thigh and foot wounds have reopened. The foot wound has a lot of callus accumulation around and over it. The thigh wound is tiny with just a little bit of slough in the wound bed. The lateral leg wound continues to contract. His vascular surgeon took the wound VAC off  earlier in the week and the patient has been doing wet-to-dry dressings. There is a little slough accumulation on the surface. The tunnel is about 3 cm in depth at this point. 12/28/2021: The thigh wound is closed again. The foot wound has some callus that subsequently has peeled back exposing just a small slit of a wound. The lateral leg wound Is down to about half the  size that it originally was and the tunnel is down to about half a centimeter in depth. 01/04/2022: The thigh wound remains closed. The foot wound has heavy callus overlying the wound site. Once this was debrided, the wound was found to be closed. The lateral leg wound is smaller again this week and very superficial. No tunnel could be identified. ALVINO, REVILL ([id number]) [id number] Page 6 of 21 01/12/2022: The thigh and foot wounds both remain closed. The lateral leg wound is now nearly flush with the skin surface. There is good granulation tissue present with a light layer of slough. 01/19/2022: Due to the way his wrap was placed, the patient did not change the dressing on his thigh at all and so the foam was saturated and his skin is macerated. There is a light layer of slough on the wound surface. The underlying granulation tissue is robust and healthy-appearing. He has heavy callus buildup at the site of his first metatarsal head wound which is still healed. 02/01/2022: He has been in silver alginate. When he removed the dressing from his thigh wound, however, some leg, superficially reopening a portion of the wound that had healed. In addition, underneath the callus at his left first metatarsal head, there appears to be a blister and the wound appears to be open again. 02/08/2022: The lateral leg wound has contracted substantially. There is eschar and a light layer of slough present. He says that it is starting to pull and is uncomfortable. On inspection, there is some puckering of the scar and the eschar  is quite dry; this may account for his symptoms. On his first metatarsal head, the wound is much smaller with just some eschar on the surface. The callus has not reaccumulated. He reports that he had a blister come up on his medial thigh wound at the distal aspect. It popped and there is now an opening in his skin again. Looking back through his library of wound photos, there is what looks like a permanent suture just deep to this location and it may be trying to erode through. We have been using silver alginate on his wounds. 02/15/2022: The lateral leg wound is about half the size it was last week. It is clean with just a little perimeter eschar and light slough. The wound on his first metatarsal head is about the same with heavy callus overlying it. The medial thigh wound is closed again. He does have some skin changes on the top of his foot that looks potentially yeast related. 02/22/2022: The skin on the top of his foot improved with the use of a topical antifungal. The lateral leg wound continues to contract and is again smaller this week. There is a little bit of slough and eschar on the surface. The first metatarsal head wound is a little bit smaller but has reaccumulated a thick callus over the top. He decided to try to trim his toenail and ultimately took the entire nail off of his left great toe. 03/02/2022: His lateral leg wound continues to improve, as does the wound on his left great toe. Unfortunately, it appears that somehow his foot got wet and moisture seeped in through the opening causing his skin to lift. There is a large wound now overlying his first metatarsal on both the plantar, medial, and dorsal portion of his foot. There is necrotic tissue and slough present underneath the shaggy macerated skin. 03/08/2022: The lateral leg wound is smaller again today. There is just a light layer  of slough and eschar on the surface. The great toe wound is smaller again today. The first metatarsal  wound is a little bit smaller today and does not look nearly as necrotic and macerated. There is still slough and nonviable tissue present. 03/15/2022: The lateral leg wound is narrower and just has a little bit of light slough buildup. The first metatarsal wound still has a fair amount of moisture affecting the periwound skin. The great toe wound is healed. 03/22/2022: The lateral leg wound is now isolated to just at the level of his knee. There is some eschar and slough accumulation. The first metatarsal head wound has epithelialized tremendously and is about half the size that it was last week. He still has some maceration on the top of his foot and a fungal odor is present. 03/29/2022: T oday the patient's foot was macerated, suggesting that the cast got wet. The patient has also been picking at his dry skin and has enlarged the wound on his left lateral leg. In the time between having his cast removed and my evaluation, he had picked more dry skin and opened up additional wounds on his Achilles area and dorsal foot. The plantar first metatarsal head wound, however, is smaller and clean with just macerated callus around the perimeter and light slough on the surface. The lateral leg wound measured a little bit larger but is also fairly clean with eschar and minimal slough. 04/02/2022: The patient had vascular studies done last Friday and so his cast was not applied. He is here today to have that done. 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 04/05/2022: Both wounds are smaller and quite clean. There is just a little biofilm on the lateral leg wound. 10/20; the patient has a wound on the left lateral surgical incision at the level of his lateral knee this looks clean and improved. He is using silver alginate. He also has an area on his left medial foot for which she is using Hydrofera Blue under a total contact cast both wounds are measuring smaller 04/20/2022: The plantar foot wound has  contracted considerably and is very close to closing. The lateral leg wound was measured a little larger, but there was a tiny open area that was included in the measurements that was not included last week. He has some eschar around the perimeter but otherwise the wound looks clean. 04/27/2022: The lateral leg wound looks better this week. He says that midweek, he felt it was very dry and began applying hydrogel to the site. I think this was beneficial. The foot wound is nearly closed underneath a thick layer of dry skin and callus. 05/04/2022: The foot wound is healed. He has developed a new small ulcer on his anterior tibial surface about midway up his leg. It has a little slough on the surface. The lateral leg wound still is fairly dry, but clean with just a little biofilm on the surface. 05/11/2022: The wound on his foot reopened on Wednesday. A large blister formed which then broke open revealing the fat layer underneath. The ulcer on his anterior tibial surface is a little bit larger this week. The lateral leg wound has much better moisture balance this week. Fortunately, prior to his foot wound reopening, he did get the cast made for his orthotic. 05/15/2022: Already, the left medial foot wound has improved. The tissue is less macerated and the surface is clean. The ulcer on his anterior tibial surface continues to enlarge. This seems likely  secondary to accumulated moisture. The lateral leg wound continues to have an improved moisture balance with the use of collagen. 05/25/2022: The medial foot wound continues to contract. It is now substantially smaller with just a little slough on the surface. The anterior tibial surface wound continues to enlarge further. Once again, this seems to be secondary to moisture. The lateral leg wound does not seem to be changing much in size, but the moisture balance is better. 06/01/2022: The anterior tibial wound is closed. The medial foot wound is down to just  a very small, couple of millimeters, opening. The lateral leg wound has good moisture balance, but remains unchanged in size. 12/15; the patient's anterior tibial wound has reopened, however the area on his right first metatarsal head is closed. The major wound is actually on the superior part of his surgical wound in the left lateral thigh. Not a completely viable surface under illumination. This may at some point require a debridement I think he is currently using Prisma. As noted the left medial foot wound has closed 06/14/2022: The anterior tibial wound has closed. The lateral leg wound has a better surface but is basically unchanged in size. The left medial foot wound has reopened. It looks as though there was some callus accumulation and moisture got under the callus which caused the tissue to break down again. 06/21/2022: A new wound has opened up just distal to the previous anterior tibial wound. It is small but has hypertrophic granulation tissue present. The lateral leg wound is a little bit narrower and has a layer of slough on the surface. The left medial foot wound is down to just a pinhole. His custom orthotics should be available next week. 06/28/2022: The wound on his first metatarsal head has healed. He has developed a new small wound on his medial lower leg, in an old scar site. The lateral leg wound continues to contract but continues to accumulate slough, as well. [name], [name] ([id number]) [id number] Page 7 of 21 07/03/2022: Despite wearing his custom orthopedic shoes, he managed to reopen the wound on his first metatarsal head. He says he thinks his foot got wet and then some skin lifted up and he peeled this away. Both of the lower leg wounds are smaller and have some dry eschar on the surface. The lateral leg wound is quite a bit narrower today. 07/12/2022: The medial lower leg wound is closed. The anterior lower leg wound has contracted considerably.  The lateral upper leg wound is narrower with a layer of slough on the surface. The first metatarsal head wound is also smaller, but had copious drainage which saturated the foam border dressing and resulted in some periwound tissue maceration. Fortunately there was no breakdown at this site. 07/19/2022: The lower leg shows signs of significant maceration; I think he must be sweating excessively inside his cast. There are several areas of skin breakdown present. The wound on his foot is smaller and that on his lateral leg is narrower and is shorter by about a centimeter. 07/26/2022: Last week we used a zinc Coflex wrap prior to applying his total contact cast and this has had the effect of keeping his skin from getting macerated this week. The anterior leg wound has epithelialized substantially. The lateral leg wound is significantly smaller with just a bit of slough on the surface. The first metatarsal head wound is also smaller this week. 08/02/2022: The anterior leg wound was closed on arrival, but while he was sitting in  the room, he picked it open again. The lateral leg wound is smaller with just a little slough on the surface and the first metatarsal head wound has contracted further, as well. 08/09/2022: The first metatarsal head wound is covered with callus. Underneath the callus, it is nearly completely closed. The lateral leg wound is smaller again this week. The anterior leg wound looks better, but he has such heavy buildup of old skin, that moisture is getting underneath this, becoming trapped, and causing the underlying skin to get macerated and open up. 08/16/2022: The first metatarsal head wound is closed. The lateral leg wound continues to contract and is quite a bit smaller again this week. There is just a small, superficial opening remaining on his anterior tibial surface. 08/23/2022: The first metatarsal head wound has, by some miracle, remained closed. The lateral leg wound is substantially  smaller with multiple areas of epithelialization. The anterior tibial surface wound is also quite a bit smaller and very clean. 08/30/2022: Unfortunately, his first metatarsal head wound opened up again. It happened in the same fashion as it has on prior occasions. Moisture got under dried skin/callus and created a wound when he removed his sock, taking the skin with it. The anterior tibial surface has a thick shell of hyperkeratotic skin. This has been contributing to ongoing repeat wounding events as moisture gets underneath this and causes tissue breakdown. 3/15; patient presents for follow-up. His anterior left leg wound has healed. He still has the wound to the left lateral aspect and left first met head. We have been using silver alginate and endoform to these areas under [employer]. He has no issues or complaints today. He has been taking Augmentin and reports improvement to his symptoms to the left first met head. 09/13/2022: He has accumulated more thick dry skin in sheets on his lower leg. The lateral leg wound is about the same size and the left first metatarsal head wound is a little bit smaller. There is slough on both surfaces. There is callus buildup around the foot wound. 09/20/2022: The lateral leg wound is a little bit narrower and the left first metatarsal head wound also seems to have contracted slightly. There is slough on both surfaces. He has a little skin breakdown on his anterior tibial surface. 09/27/2022: The lateral leg wound continues to contract and is quite clean. The first metatarsal head wound is also smaller. There is some perimeter callus and slough accumulation on the foot. The anterior tibial surface is closed. 10/04/2022: Both of his wounds are smaller today, particularly the first metatarsal head wound. 10/18/2022: He missed his appointment last week and ended up cutting off his wrap on Saturday. The anterior tibial wound reopened. It is fairly superficial with a little  bit of slough on the surface. His lateral leg wound is smaller with some slough and eschar buildup. The first metatarsal head wound is also smaller with some callus and slough accumulation. 10/25/2022: All wounds are smaller. There is slough and eschar on the lateral leg and slough and callus on the plantar foot wound. The anterior tibial wound is clean and flush with the surrounding skin. No debris accumulation here. 11/01/2022: The wounds are all smaller again this week. There is slough on the lateral leg and some minimal slough and eschar on the anterior tibial wound. There is callus accumulation on the first metatarsal head site, along with slough. There is also a yeasty odor coming from the foot. 11/08/2022: The lateral leg wound is  smaller except where he picked some skin while waiting to be seen. There is a little bit of slough on the surface. The anterior tibial wound is closed. The first metatarsal head site has gotten macerated once again and has a lot of spongy wet tissue and callus around it. No yeast odor today. 11/15/2022: The lateral leg wound continues to contract. There is now a band of epithelium dividing it into 2 areas. Minimal slough accumulation. He managed to pick open a new wound on his medial lower leg. The first metatarsal head site is smaller with some callus accumulation but no tissue maceration. 11/22/2022: The lateral leg wound is smaller again by about a third today. There is a little bit of slough on the surface with some periwound eschar. The new wound that he picked open last week has healed but he picked open 3 new small wounds on his anterior tibial surface, once again due to his picking at his skin. The first metatarsal head wound looks about the same. There has been some callus accumulation and there is more edema present, as he was not put in a compression wrap last week. 11/29/2022: The lateral leg wound is smaller again today. There is a little periwound eschar and some  slough present. The wounds on his anterior tibial surface are all closed except for 1 that has a little bit of slough on the open portion with eschar covering it. The first metatarsal head wound measured a little bit smaller today, but mostly looks about the same. Edema control is better. 12/06/2022: The anterior tibial wound is closed. The lateral leg wound is smaller with some slough and eschar accumulation. The plantar foot ulcer is about the same size, but the tissue does show some evidence of the fact that he was on his feet quite a bit more this past week; there was a death in his family. There has been more callus accumulation and the tissues are little bit more purpleish. 12/13/2022: He picked the skin on his anterior tibia and reopened a wound there while he was waiting to be seen in clinic. The lateral leg wound is much smaller with a little bit of slough and eschar. The plantar foot ulcer is smaller today with callus and slough buildup, but without the pressure induced tissue injury that was seen last week. 12/20/2022: The anterior tibial wound has healed. The lateral leg wound is smaller again this week. There is a little bit of eschar buildup on the surface. The foot had a fairly strong odor coming from it that persisted even after washing. The wound itself looks like its gotten a little smaller but he has built up thick callus, once again. 12/26/2022: The lateral leg wound is down to just a narrow superficial slit. There is slough and a little bit of dry skin present. The foot is in much better shape today. There is less callus accumulation and no odor. The skin edges are starting to roll inward, however. 01/03/2023: The lateral leg wound is healed. The skin is quite dry but the wound has closed. The foot continues to contract. He has a fair amount of periwound callus accumulation and the surface of the is a little drier than ideal. 01/10/2023: The large lateral leg wound remains closed. He  managed to pick off some of his dry skin and has multiple small open superficial wounds on his [name], [name] ([id number]) [phone].pdf Page 8 of 21 lower leg. The foot wound measures smaller, but he has a  blister immediately adjacent to it. 01/17/2023: The large lateral leg wound remains closed. The small superficial wounds on his lower leg have also closed. The foot wound is about the same and the blister area immediately adjacent to it has dark eschar on the surface. The foot wound looks a bit dry. 01/24/2023: He picked at some dry skin on his lateral leg wound and reopened. The surface is dry and fibrotic. The foot wound is slightly smaller but has periwound callus and the edges are rolling inward again. Electronic Signature(s) Signed: 01/24/2023 9:33:54 AM By: [name] MD FACS Entered By: [name] on 01/24/2023 09:33:54 -------------------------------------------------------------------------------- Physical Exam Details Patient Name: Date of Service: [name] Grapes. 01/24/2023 8:30 A M Medical Record Number: [id number] Patient Account Number: [id number] Date of Birth/Sex: Treating RN: 1951-01-01 (72 y.o. M) Primary Care Provider: [name] Other Clinician: Referring Provider: Treating Provider/Extender: [name] in Treatment: 33 Constitutional Hypertensive, asymptomatic. . . . no acute distress. Respiratory Normal work of breathing on room air. Notes 01/24/2023: He picked at some dry skin on his lateral leg wound and reopened. The surface is dry and fibrotic. The foot wound is slightly smaller but has periwound callus and the edges are rolling inward again. Electronic Signature(s) Signed: 01/24/2023 9:34:28 AM By: [name] MD FACS Entered By: [name] on 01/24/2023 09:34:28 -------------------------------------------------------------------------------- Physician Orders Details Patient Name: Date of  Service: [name] Grapes. 01/24/2023 8:30 A M Medical Record Number: [id number] Patient Account Number: [id number] Date of Birth/Sex: Treating RN: 1950/11/25 (72 y.o. 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 - Dr Lady Gary - Room 2 Return A Anesthetic (In clinic) Topical Lidocaine 4% applied to wound bed Bathing/ Shower/ Hygiene May shower with protection but do not get wound dressing(s) wet. Protect dressing(s) with water repellant cover (for example, large plastic bag) or a cast cover and may then take shower. Edema Control - Lymphedema / SCD / Other Elevate legs to the level of the heart or above for 30 minutes daily and/or when sitting for 3-4 times a day throughout the day. Avoid standing for long periods of time. Patient to wear own compression stockings every day. - both legs daily Moisturize legs daily. Compression stocking or Garment 20-30 mm/Hg pressure to: Off-Loading Wound #18R Left,Plantar Metatarsal head first Open toe surgical shoe to: - Front off loader Left ft Other: - minimal weight bearing left foot Additional Orders / Instructions Follow Nutritious Diet - vitamin C 500 mg 3 times a day and zinc 30-50 mg a  day Wound Treatment Wound #18R - Metatarsal head first Wound Laterality: Plantar, Left Cleanser: Soap and Water 1 x Per Week/30 Days Discharge Instructions: May shower and wash wound with dial antibacterial soap and water prior to dressing change. Cleanser: Wound Cleanser 1 x Per Week/30 Days Discharge Instructions: Cleanse the wound with wound cleanser prior to applying a clean dressing using gauze sponges, not tissue or  cotton balls. Peri-Wound Care: Ketoconazole Cream 2% 1 x Per Week/30 Days Discharge Instructions: Apply Ketoconazole as directed Peri-Wound Care: Triamcinolone 15 (g) 1 x Per Week/30 Days Discharge Instructions: Use triamcinolone 15 (g) as directed Topical: Gentamicin 1 x Per Week/30 Days Discharge Instructions: As directed by physician Topical: Mupirocin Ointment 1 x Per Week/30 Days Discharge Instructions: Apply Mupirocin (Bactroban) as instructed Prim Dressing: Promogran Prisma Matrix, 4.34 (sq in) (silver collagen) 1 x Per Week/30 Days ary Discharge Instructions: Moisten collagen with saline or hydrogel Secondary Dressing: Optifoam Non-Adhesive Dressing, 4x4 in (Generic) 1 x Per Week/30 Days Discharge Instructions: Apply over primary dressing as directed. Secondary Dressing: Woven Gauze Sponges 2x2 in (Generic) 1 x Per Week/30 Days Discharge Instructions: Apply over primary dressing as directed. Secondary Dressing: Zetuvit Absorbent Pad, 4x4 (in/in) 1 x Per Week/30 Days Compression Wrap: Urgo K2, (equivalent to a 4 layer) two layer compression system, regular 1 x Per Week/30 Days Discharge Instructions: Apply Urgo K2 as directed (alternative to 4 layer compression). Wound #22R - Lower Leg Wound Laterality: Left, Lateral, Proximal Cleanser: Soap and Water 1 x Per Day/30 Days Discharge Instructions: May shower and wash wound with dial antibacterial soap and water prior to dressing change. Cleanser: Wound Cleanser 1 x Per Day/30 Days Discharge Instructions: Cleanse the  wound with wound cleanser prior to applying a clean dressing using gauze sponges, not tissue or cotton balls. Prim Dressing: Endoform 2x2 in 1 x Per Day/30 Days ary Discharge Instructions: Moisten with saline Secondary Dressing: Woven Gauze Sponge, Non-Sterile 4x4 in 1 x Per Day/30 Days Discharge Instructions: Apply over primary dressing as directed. Secured With: Elastic Bandage 4 inch (ACE bandage) 1 x Per Day/30 Days Discharge Instructions: Secure with ACE bandage as directed. Secured With: [employer], 4.5x3.1 (in/yd) 1 x Per Day/30 Days Discharge Instructions: Secure with Kerlix as directed. KHALEED, [name] ([id number]) [id number] Page 10 of 21 Patient Medications llergies: No Known Drug Allergies A Notifications Medication Indication Start End 01/24/2023 lidocaine DOSE topical 4 % cream - cream topical Electronic Signature(s) Signed: 01/24/2023 12:27:09 PM By: [name] MD FACS Entered By: [name] on 01/24/2023 09:35:23 -------------------------------------------------------------------------------- Problem List Details Patient Name: Date of Service: [name] Grapes. 01/24/2023 8:30 A M Medical Record Number: [id number] Patient Account Number: [id number] Date of Birth/Sex: Treating RN: Feb 16, 1951 (72 y.o. 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 01/24/2023 No Yes I87.322 Chronic venous hypertension (idiopathic) with inflammation of left lower 04/12/2021 No Yes extremity E11.51 Type 2 diabetes mellitus with diabetic peripheral angiopathy without gangrene  04/12/2021 No Yes I89.0 Lymphedema, not elsewhere classified 04/12/2021 No Yes L85.9 Epidermal thickening, unspecified 08/30/2022 No Yes Inactive Problems ICD-10 Code Description Active Date Inactive Date E11.621 Type 2 diabetes mellitus with foot ulcer 04/12/2021 04/12/2021 E11.42 Type 2 diabetes mellitus with diabetic polyneuropathy 04/12/2021 04/12/2021 L02.416 Cutaneous abscess of left lower limb 06/13/2021 06/13/2021 [name] ([id number]) [phone].pdf Page 11 of 21 Resolved Problems ICD-10 Code Description Active Date Resolved Date L97.828 Non-pressure chronic ulcer of other part of left lower leg with other specified severity 04/12/2021 04/12/2021 L97.828 Non-pressure chronic ulcer of other part of left lower leg with other specified severity 01/10/2023 06/08/2022 Electronic Signature(s) Signed: 01/24/2023 9:31:54  AM By: [name] MD FACS Previous Signature: 01/24/2023 9:26:35 AM Version By: [name] MD FACS Entered By: [name] on 01/24/2023 09:31:54 -------------------------------------------------------------------------------- Progress Note Details Patient Name: Date of Service: [name] Grapes. 01/24/2023 8:30 A M Medical Record Number: [id number] Patient Account Number: [id number] Date of Birth/Sex: Treating RN: May 24, 1951 (71 y.o. M) Primary Care Provider: [name] Other Clinician: Referring Provider: Treating Provider/Extender: [name] in Treatment: 72 Subjective History of Present Illness (HPI) 10/11/17; Mr. Evan is a 72 year old man who tells me that in 2015 he slipped down the latter traumatizing his left leg. He developed a wound in the same spot the area that we are currently looking at. He states this closed over for the most part although he always felt it was somewhat unstable. In 2016 he hit the same area with the door of his car had this reopened. He tells me that this is never  really closed although sometimes an inflow it remains open on a constant basis. He has not been using any specific dressing to this except for topical antibiotics the nature of which were not really sure. His primary doctor did send him to see Dr. Jacinto Halim of interventional cardiology. He underwent an angiogram on 08/06/17 and he underwent a PTA and directional atherectomy of the lesser distal SFA and popliteal arteries which resulted in brisk improvement in blood flow. It was noted that he had 2 vessel runoff through the anterior tibial and peroneal. He is also been to see vascular and interventional radiologist. He was not felt to have any significant superficial venous insufficiency. Presumably is not a candidate for any ablation. It was suggested he come here for wound care. The patient is a type II diabetic on insulin. He also has a history of venous insufficiency. ABIs on the left were noncompressible in our clinic 10/21/17; patient we admitted to the clinic last week. 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 We put Iodosorb on him after an aggressive debridement and 3 layer compression. He complained of pain in his ankle and itching with is skin in fact he scratched the area on the medial calf superiorly at the rim of our wraps and he has 2 small open areas in that location today which are new. I changed his primary dressing today to silver collagen. As noted he is already had revascularization and does not have any significant superficial venous insufficiency that would be amenable to ablation 10/28/17; patient admitted to the clinic 2 weeks ago. He has a smaller Wound. Scratch injury from last week revealed. There is large wound over the tibial area. This is smaller. Granulation looks healthy. No need for debridement. 11/04/17; the wound on the left lateral calf looks better. Improved dimensions. Surface of this looks better. We've  been maintaining him and Kerlix Coban wraps. He finds this much more comfortable. Silver collagen dressing 11/11/17; left lateral Wound continues to look healthy be making progress. Using a #5 curet I removed removed nonviable skin from the surface of the wound and then necrotic debris from the wound surface. Surface of the wound continues to look healthy. He also has an open area on the left great toenail bed. We've been using topical antibiotics. 11/19/17; left anterior lateral wound continues to look healthy but it's not closed. He also had a small wound above this on the left leg Initially traumatic wounds in the setting of significant chronic  venous insufficiency and stasis dermatitis 11/25/17; left anterior wounds superiorly is closed still a small wound inferiorly. 12/02/17; left anterior tibial area. Arrives today with adherent callus. Post debridement clearly not completely closed. Hydrofera Blue under 3 layer compression. 12/09/17; left anterior tibia. Circumferential eschar however the wound bed looks stable to improved. We've been using Hydrofera Blue under 3 layer compression 12/17/17; left anterior tibia. Apparently this was felt to be closed however when the wrap was taken off there is a skin tear to reopen wounds in the same area we've been using Hydrofera Blue under 3 layer compression 12/23/17 left anterior tibia. Not close to close this week apparently the [facility] was stuck to this again. Still circumferential eschar requiring debridement. I put a contact layer on this this time under the Hydrofera Blue 12/31/17; left anterior tibia. Wound is better slight amount of hyper-granulation. Using Hydrofera Blue over Adaptic. 01/07/18; left anterior tibia. The wound had some surface eschar however after this was removed he has no open wound.he was already revascularized by Dr. Jacinto Halim when he came to our clinic with atherectomy of the left SFA and popliteal artery. He was also sent to  interventional radiology for venous reflux studies. He was not felt to have significant reflux but certainly has chronic venous changes of his skin with hemosiderin deposition around this area. He will definitely need to lubricate his skin and wear compression stocking and I've talked to him about this. [name], [name] ([id number]) [id number] Page 12 of 21 05/26/2018 This is a now 72 year old man we cared for with traumatic wounds on his left anterior lower extremity. He had been previously revascularized during that admission by Dr. Jacinto Halim. Apparently in follow-up Dr. Jacinto Halim noted that he had deterioration in his arterial status. He underwent a stent placement in the distal left SFA on 04/22/2018. 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 He went back to the angiography suite on 04/30/2018 he underwent PTA and balloon angioplasty of the occluded left mid anterior tibial artery, thrombotic occlusion went from 100 to 0% which reconstitutes the posterior tibial artery. He had thrombectomy and aspiration of the peroneal artery. The stent placed in the distal SFA left SFA was still occluded. 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 He has been recuperating at [facility] home in [facility] after the most recent hospitalization. He is going home tomorrow. He tells me that 2 weeks ago he traumatized the tip of his left fifth toe. He came in urgently for our review of this. This was a history of before I noted that Dr. Jacinto Halim had already noted dry gangrenous changes of the left fifth toe 06/09/2018; 2-week follow-up. I did contact Dr. Jacinto Halim after his last appointment and he apparently saw 1 of Dr. Verl Dicker  colleagues the next day. He does not follow-up with Dr. Jacinto Halim himself until Thursday of this week. 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 Nevertheless there is no evidence of infection no drainage and no pain. He had a new area that this week when we were signing him in today on the left anterior mid tibia area, this is in close proximity to the previous wound we have dealt with in this  clinic. 06/23/2018; 2-week follow-up. I did not receive a recent note from Dr. Jacinto Halim to review today. Our office is trying to obtain this. He is apparently not planning to do further vascular interventions and wondered about compression to try and help with the patient's chronic venous insufficiency. However we are also concerned about the arterial flow. He arrives in clinic today with a new area on the left third toe. The areas on the calf/anterior tibia are close to closing. The left fifth toe is still mummified using Betadine. -In reviewing things with the patient he has what sounds like claudication with mild to moderate amount of activity. 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 I prescribed Levaquin over the phone while we attempted to arrange a plan of care. However the patient called yesterday to report he had low-grade fever and he came in today acutely. There is been a marked deterioration in the left third toe with spreading cellulitis up into the dorsal left foot. He was referred to the emergency room. Readmission: 06/29/2020 patient presents today for reevaluation here in our clinic he was previously treated by Dr. Leanord Hawking at the latter part of 2019 in 2 the beginning of 2020. Subsequently we have not seen him since that time in the interim he did have evaluation with vein and vascular specialist specifically Dr. Bo Mcclintock who did perform quite extensive work for a left femoral to anterior tibial artery bypass. With that being said in the interim  the patient has developed significant lymphedema and has wounds that he tells me have really never healed in regard to the incision site on the left leg. He also has multiple wounds on the feet for various reasons some of which is that he tends to pick at his feet. Fortunately there is no signs of active infection systemically at this time he does have some wounds that are little bit deeper but most are fairly superficial he seems to have good blood flow and overall everything appears to be healthy I see no bone exposed and no obvious signs of osteomyelitis. I do not know that he necessarily needs a x-ray at this point although that something we could consider depending on how things progress. The patient does have a history of lymphedema, diabetes, this is type II, chronic kidney disease stage III, hypertension, and history of peripheral vascular disease. 07/05/2020; patient admitted last week. Is a patient I remember from 2019 he had a spreading infection involving the left foot and we sent him to the hospital. 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 He subsequently had a left femoral to anterior tibial bypass by Dr.Cain vein and vascular. He also has severe lymphedema with chronic skin changes related to that on the left leg. The most problematic area that was new today was on the left medial great toe. This was apparently a small area last week there was purulent drainage which our intake nurse cultured. Also areas on the left medial foot and heel left lateral foot. He has 2 areas on the left medial calf left lateral calf in the setting of the severe lymphedema. 07/13/2020 on evaluation today patient appears to be doing better in my opinion compared to his last visit. The good news is there is no signs of active infection systemically and locally I do not see any signs of infection either. He did have an x-ray which was negative that is great news  he had a  culture which showed MRSA but at the same time he is been on the doxycycline which has helped. I do think we may want to extend this for 7 additional days 1/25; patient admitted to the clinic a few weeks ago. 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 We have been putting him under compression his edema control is a lot better but he is severe verricused skin on the left leg. He is really done quite well he still has an open area on the left medial calf and the left medial first metatarsal head. We have been using silver collagen on the leg silver alginate on the foot 07/27/2020 upon evaluation today patient appears to be doing decently well in regard to his wounds. He still has a lot of dry skin on the left leg. Some of this is starting to peel back and I think he may be able to have them out by removing some that today. Fortunately there is no signs of active infection at this time on the left leg although on the right leg he does appear to have swelling and erythema as well as some mild warmth to touch. This does have been concerned about the possibility of cellulitis although within the differential diagnosis I do think that potentially a DVT has to be at least considered. We need to rule that out before proceeding would just call in the cellulitis. Especially since he is having pain in the posterior aspect of his calf muscle. 2/8; the patient had seen sparingly. He has severe skin changes of chronic lymphedema in the left leg thickened hyperkeratotic verrucous skin. He has an open wound on the medial part of the left first met head left mid tibia. He also has a rim of nonepithelialized skin in the anterior mid tibia. He brought in the AmLactin lotion that was been prescribed although I am not sure under compression and its utility. There concern about cellulitis on the right lower leg the last time he was here. He was put on on antibiotics. His DVT rule  out was negative. The right leg looks fine he is using his stocking on this area 08/10/2020 upon evaluation today patient appears to be doing well with regard to his leg currently. He has been tolerating the dressing changes without complication. Fortunately there is no signs of active infection which is great news. Overall very pleased with where things stand. 2/22; the patient still has an area on the medial part of the left first met his head. This looks better than when I last saw this earlier this month he has a rim of epithelialization but still some surface debris. Mostly everything on the left leg is healed. There is still a vulnerable in the left mid tibia area. 08/30/2020 upon evaluation today patient appears to be doing much better in regard to his wounds on his foot. 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 Nonetheless I think we will address that today I Minna send in a prescription for him in that regard. Overall though there does not appear to be any signs of significant worsening. 09/07/2020 on evaluation today patient's wounds over his left foot appear to be doing excellent. I do not see any signs of infection there is some callus buildup this can require debridement for certain but  overall I feel like he is managing quite nicely. He still using the AmLactin cream which has been beneficial for him as well. 3/22; left foot wound is closed. There is no open area here. He is using ammonium lactate lotion to the lower extremities to help exfoliate dry cracked skin. He has compression stockings from elastic therapy in [place]. The wound on the medial part of his left first met head is healed today. READMISSION 04/12/2021 Mr. Porchia is a patient we know fairly well he had a prolonged stay in clinic in 2019 with wounds on his left lateral and left anterior  lower extremity in the setting of chronic venous insufficiency. More recently he was here earlier this year with predominantly an area on his left foot first metatarsal head plantar and he says the plantar foot broke down on its not long after we discharged him but he did not come back here. The last few months areas of broken down on his left anterior and again the left lateral lower extremity. The leg itself is very swollen chronically enlarged a lot of hyperkeratotic dry Berry Q skin in the left lower leg. His edema extends well into the thigh. He was seen by Dr. Randie Heinz. He had ABIs on 03/02/2021 showing an ABI on the right of 1 with a TBI of 0.72 his ABI in the left at 1.09 TBI of 0.99. Monophasic and biphasic waveforms on the right. On the left monophasic waveforms were noted he went on to have an angiogram on 03/27/2021 this showed the aortic aortic KAYNON, POLCZYNSKI ([id number]) [phone].pdf Page 13 of 21 and iliac segments were free of flow-limiting stenosis the left common femoral vein to evaluate the left femoral to anterior tibial artery bypass was unobstructed the bypass was patent without any areas of stenosis. We discharged the patient in bilateral juxta lite stockings but very clearly that was not sufficient to control the swelling and maintain skin integrity. He is clearly going to need compression pumps. 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 This is fortunate because he is on his feet for long periods of time. 10/27; patient comes in with our intake nurse reporting copious amount of green drainage from the left anterior mid tibia the left dorsal foot and to a lesser extent the left medial mid tibia. We left the compression wrap on all week for the amount of edema in his left leg is quite a bit better. We use silver alginate as the primary dressing 11/3; edema control is good. Left anterior lower leg left  medial lower leg and the plantar first metatarsal head. The left anterior lower leg required debridement. Deep tissue culture I did of this wound showed MRSA I put him on 10 days of doxycycline which she will start today. We have him in compression wraps. He has a security card and [employer] however he is retiring on November 15. We will need to then get him into a better offloading boot for the left foot perhaps a total contact cast 11/10; edema control is quite good. Left anterior and left medial lower leg wounds in the setting of chronic venous insufficiency and lymphedema. He also has a substantial area over the left plantar first metatarsal head. 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 He has significant hypergranulation on the left plantar foot  wound. The patient is a diabetic but he does not have significant PAD 11/17; edema control is quite good. Left anterior and left medial lower leg wounds look better. The really concerning area remains the area on the left plantar first metatarsal head. He has a rim of epithelialization. 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 Starting today with a forefoot off loader but . possibly a total contact cast. 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 Arrives with a very significant odor from I think all of the wounds on the left leg and the left foot. Because of this I did not put a total contact cast on him today but will could still consider this. His wife was having cataract surgery which is the reason he missed the appointment 12/6. I saw this man 5 days ago with a swelling below the popliteal fossa. I thought he actually might have a Baker's  cyst however the DVT rule out study that we could arrange right away was negative the technician told me this was not a ruptured Baker's cyst. We attempted to get this aspirated by under ultrasound guidance in interventional radiology however all they did was an ultrasound however it shows an extensive fluid collection 62 x 8 x 9.4 in the left thigh and left calf. The patient states he thinks this started 8 days ago or so but he really is not complaining of any pain, fever or systemic symptoms. He has not ha 12/20; after some difficulty I managed to get the patient into see Dr. Randie Heinz. Eventually he was taken into the hospital and had a drain put in the fluid collection below his left knee posteriorly extending into the posterior thigh. He still has the drain in place. Culture of this showed moderate staff aureus few Morganella and few Klebsiella he is now on doxycycline and ciprofloxacin as suggested by infectious disease he is on this for a month. The drain will remain in place until it stops draining 12/29; he comes in today with the 1 wound on his left leg and the area on the left plantar first met head significantly smaller. Both look healthy. He still has the drain in the left leg. He says he has to change this daily. Follows up with Dr. Randie Heinz on January 11. 06/29/2021; the wounds that I am following on the left leg and left first met head continued to be quite healthy. However the area where his inferior drain is in place had copious amounts of drainage which was green in color. The wound here is larger. Follows up with Dr. Pascal Lux of vein and vascular his surgeon next week as well as infectious disease. He remains on ciprofloxacin and doxycycline. He is not complaining of excessive pain in either one of the drain areas 1/12; the patient saw vascular surgery and infectious disease. Vascular surgery has left the drain in place as there was still some notable drainage still see him back in 2 weeks. Dr.  Dorthula Perfect stop the doxycycline and ciprofloxacin and I do not believe he follows up with them at this point. Culture I did last week showed both doxycycline resistant MRSA and Pseudomonas not sensitive to ciprofloxacin although only in rare titers 1/19; the patient's wound on  the left anterior lower leg is just about healed. We have continued healing of the area that was medially on the left leg. Left first plantar metatarsal head continues to get smaller. The major problem here is his 2 drain sites 1 on the left upper calf and lateral thigh. There is purulent drainage still from the left lateral thigh. I gave him antibiotics last week but we still have recultured. He has the drain in the area I think this is eventually going to have to come out. I suspect there will be a connecting wound to heal here perhaps with improved VAc 1/26; the patient had his drain removed by vein and vascular on 1/25/. This was a large pocket of fluid in his left thigh that seem to tunnel into his left upper calf. 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 His mention his Penrose drain was removed today. He now has a tunneling wound on his left calf and left thigh. Both of these probe widely towards each other although I cannot really prove that they connect. Both wounds on his lower leg anteriorly are closed and his area over the first metatarsal head on his right foot continues to improve. We are using Hydrofera Blue here. He also saw infectious disease culture of the abscess they noted was polymicrobial with MRSA, Morganella and Klebsiella he was treated with doxycycline and ciprofloxacin for 4 weeks ending on 07/03/2021. They did not recommend any further antibiotics. Notable that while he still had the Penrose drain in place last week he had purulent drainage coming out of the inferior IandD site this grew [facility], MRSA and Pseudomonas but there does not appear to be any active infection in this area  today with the drain out and he is not systemically unwell 2/2; with regards to the drain sites the superior one on the thigh actually is closed down the one on the upper left lateral calf measures about 8 and half centimeters which is an improvement seems to be less prominent although still with a lot of drainage. The only remaining wound is over the first metatarsal head on the left foot and this looks to be continuing to improve with Hydrofera Blue. 2/9; the area on his plantar left foot continues to contract. Callus around the wound edge. The drain sites specifically have not come down in depth. We put the wound VAC on Monday he changed the canister late last night our intake nurse reported a pocket of fluid perhaps caused by our compression wraps 2/16; continued improvement in left foot plantar wound. drainage site in the calf is not improved in terms of depth (wound vac) 2/23; continued improvement in the left foot wound over the first metatarsal head. With regards to the drain sites the area on his thigh laterally is healed however the open area on his calf is small in terms of circumference by still probes in by about 15 cm. Within using the wound VAC. Hydrofera Blue on his foot 08/24/2021: The left first metatarsal head wound continues to improve. The wound bed is healthy with just some surrounding callus. Unfortunately the open drain site on his calf remains open and tunnels at least 15 cm (the extent of a Q-tip). This is despite several weeks of wound VAC treatment. Based on reading back through the notes, there has been really no significant change in the depth of the wound, although the orifice is smaller and the more cranial wound on his thigh has closed.  I suspect the tunnel tracks nearly all the way to this location. 08/31/2021: Continued improvement in the left first metatarsal head wound. There has been absolutely no improvement to the long tunnel from his open drain site on his calf.  We have tried to get him into see vascular surgery sooner to consider the possibility of simply filleting the tract open and allowing it to heal from the bottom up, likely with a wound VAC. They have not yet scheduled a sooner appointment than his current mid April 09/14/2021: He was seen by vascular surgery and they took him to the operating room last week. They opened a portion of the tunnel, but did not extend the entire length of the known open subcutaneous tract. I read Dr. Darcella Cheshire operative note and it is not clear from that documentation why only a portion of the tract [name], [name] ([id number]) [id number] Page 14 of 21 was opened. The heaped up granulation tissue was curetted and removed from at least some portion of the tract. They did place a wound VAC and applied an Unna boot to the leg. The ulcer on his left first metatarsal head is smaller today. The bed looks good and there is just a small amount of surrounding callus. 09/21/2021: The ulcer on his left first metatarsal head looks to be stalled. There is some callus surrounding the wound but the wound bed itself does not appear particularly dynamic. The tunnel tract on his lateral left leg seems to be roughly the same length or perhaps slightly smaller but the wound bed appears healthy with good granulation tissue. He opened up a new wound on his medial thigh and the site of a prior surgical incision. He says that he did this unconsciously in his sleep by scratching. 09/28/2021: Unfortunately, the ulcer on his left first metatarsal head has extended underneath the callus toward the dorsum of his foot. The medial thigh wounds are roughly the same. The tunnel on his lateral left leg continues to be problematic; it is longer than we are able to actually probe with a Q-tip. I am still not certain as to why Dr. Randie Heinz did not open this up entirely when he took the patient to the operating room. We will likely be back in the  same situation with just a small superficial opening in a long unhealed tract, as the open portion is granulating in nicely. 10/02/2021: The patient was initially scheduled for a nurse visit, but we are also applying a total contact cast today. The plantar foot wound looks clean without significant accumulated callus. We have been applying Prisma silver collagen to the site. 10/05/2021: The patient is here for his first total contact cast change. We have tried using gauze packing strips in the tunnel on his lateral leg wound, but this does not seem to be working any better than the white VAC foam. The foot ulcer looks about the same with minimal periwound callus. Medial thigh wound is clean with just some overlying eschar. 10/12/2021: The plantar foot wound is stable without any significant accumulation of periwound callus. The surface is viable with good granulation tissue. The medial thigh wounds are much smaller and are epithelializing. On the other hand, he had purulent drainage coming from the tunnel on his lateral leg. He does go back to see Dr. Randie Heinz next week and is planning to ask him why the wound tunnel was not completely opened at the time of his most recent operation. 10/19/2021: The plantar foot wound is markedly  improved and has epithelial tissue coming through the surface. The medial thigh wounds are nearly closed with just a tiny open area. He did see Dr. Randie Heinz earlier this week and apparently they did discuss the possibility of opening the sinus tract further and enabling a wound VAC application. Apparently there are some limits as to what Dr. 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 I think if we could get the tract open to the level of the popliteal fossa, this would greatly aid in her ability to get this chart closed. That being said, however, today when I probed the tract with a Q-tip, I was not able to insert the entirety of the Q-tip as I have on  previous occasions. The tunnel is shorter by about 4 cm. The surface is clean with good granulation tissue and no further episodes of purulent drainage. 10/30/2021: Last week, the patient underwent surgery and had the long tract in his leg opened. There was a rind that was debrided, according to the operative report. His medial thigh ulcers are closed. The plantar foot wound is clean with a good surface and some built up surrounding callus. 11/06/2021: The overall dimensions of the large wound on his lateral leg remain about the same, but there is good granulation tissue present and the tunneling is a little bit shorter. He has a new wound on his anterior tibial surface, in the same location where he had a similar lesion in the past. The plantar foot wound is clean with some buildup surrounding callus. Just toward the medial aspect of his foot, however, there is an area of darkening that once debrided, revealed another opening in the skin surface. 11/13/2021: The anterior tibial surface wound is closed. The plantar foot wound has some surrounding callus buildup. The area of darkening that I debrided last week and revealed an opening in the skin surface has closed again. The tunnel in the large wound on his lateral leg has come in by about 3 cm. There is healthy granulation tissue on the entire wound surface. 11/23/2021: The patient was out of town last week and did wet-to-dry dressings on his large wound. 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 Unfortunately, he picked open the wound on his left medial thigh. He says that it was itching and he just could not stop scratching it until it was open again. The wound on his plantar foot is smaller and has not accumulated a tremendous amount of callus. The lateral leg wound is shallower and the tunnel has also decreased in depth. There is just a little bit of slough accumulation on the  surface. 11/30/2021: Another portion of his left medial thigh has been opened up. All of these wounds are fairly superficial with just a little bit of slough and eschar accumulation. The wound on his plantar foot is almost closed with just a bit of eschar and periwound callus accumulation. The lateral leg wound is nearly flush with the surrounding skin and the tunnel is markedly shallower. 12/07/2021: There is just 1 open area on his left medial thigh. It is clean with just a little bit of perimeter eschar. The wound on his plantar foot continues to contract and just has some eschar and periwound callus accumulation. The lateral leg wound is closing at the more distal aspect and the tunnel is smaller. The surface is nearly flush with the surrounding skin and it has a  good bed of granulation tissue. 12/14/2021: The thigh and foot wounds are closed. The lateral leg wound has closed over approximately half of its length. The tunnel continues to contract and the surface is now flush with the surrounding skin. The wound bed has robust granulation tissue. 12/22/2021: The thigh and foot wounds have reopened. The foot wound has a lot of callus accumulation around and over it. The thigh wound is tiny with just a little bit of slough in the wound bed. The lateral leg wound continues to contract. His vascular surgeon took the wound VAC off earlier in the week and the patient has been doing wet-to-dry dressings. There is a little slough accumulation on the surface. The tunnel is about 3 cm in depth at this point. 12/28/2021: The thigh wound is closed again. The foot wound has some callus that subsequently has peeled back exposing just a small slit of a wound. The lateral leg wound Is down to about half the size that it originally was and the tunnel is down to about half a centimeter in depth. 01/04/2022: The thigh wound remains closed. The foot wound has heavy callus overlying the wound site. Once this was debrided, the  wound was found to be closed. The lateral leg wound is smaller again this week and very superficial. No tunnel could be identified. 01/12/2022: The thigh and foot wounds both remain closed. The lateral leg wound is now nearly flush with the skin surface. There is good granulation tissue present with a light layer of slough. 01/19/2022: Due to the way his wrap was placed, the patient did not change the dressing on his thigh at all and so the foam was saturated and his skin is macerated. There is a light layer of slough on the wound surface. The underlying granulation tissue is robust and healthy-appearing. He has heavy callus buildup at the site of his first metatarsal head wound which is still healed. 02/01/2022: He has been in silver alginate. When he removed the dressing from his thigh wound, however, some leg, superficially reopening a portion of the wound that had healed. In addition, underneath the callus at his left first metatarsal head, there appears to be a blister and the wound appears to be open again. 02/08/2022: The lateral leg wound has contracted substantially. There is eschar and a light layer of slough present. He says that it is starting to pull and is uncomfortable. On inspection, there is some puckering of the scar and the eschar is quite dry; this may account for his symptoms. On his first metatarsal head, the wound is much smaller with just some eschar on the surface. The callus has not reaccumulated. He reports that he had a blister come up on his medial thigh wound at the distal aspect. It popped and there is now an opening in his skin again. Looking back through his library of wound photos, there is what looks like a permanent suture just deep to this location and it may be trying to erode through. We have been using silver alginate on his wounds. 02/15/2022: The lateral leg wound is about half the size it was last week. It is clean with just a little perimeter eschar and light  slough. The wound on his first metatarsal head is about the same with heavy callus overlying it. The medial thigh wound is closed again. He does have some skin changes on the top of his foot that looks potentially yeast related. 02/22/2022: The skin on the top of his  foot improved with the use of a topical antifungal. The lateral leg wound continues to contract and is again smaller this week. There is a little bit of slough and eschar on the surface. The first metatarsal head wound is a little bit smaller but has reaccumulated a thick callus over the top. He decided to try to trim his toenail and ultimately took the entire nail off of his left great toe. [name], [name] ([id number]) [id number] Page 15 of 21 03/02/2022: His lateral leg wound continues to improve, as does the wound on his left great toe. Unfortunately, it appears that somehow his foot got wet and moisture seeped in through the opening causing his skin to lift. There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

## 2023-01-24 NOTE — Progress Notes (Signed)
TAZE, CRUNK ([id number]) [id number] Page 1 of 9 Visit Report for 01/24/2023 Arrival Information Details Patient Name: Date of Service: [name], [name] 01/24/2023 8:30 A M Medical Record Number: [id number] Patient Account Number: [id number] Date of Birth/Sex: Treating RN: Dec 16, 1950 (72 y.o. [name] Primary Care [place] Langstaff: [name] Other Clinician: Referring Marshaun Lortie: Treating Shawnte Winton/Extender: [name] in Treatment: 32 Visit Information History Since Last Visit Added or deleted any medications: No Patient Arrived: Ambulatory Any new allergies or adverse reactions: No Arrival Time: 08:38 Had a fall or experienced change in No Accompanied By: self activities of daily living that may affect Transfer Assistance: None risk of falls: Patient Identification Verified: Yes Signs or symptoms of abuse/neglect since last visito No Secondary Verification Process Completed: Yes Hospitalized since last visit: No Patient Requires Transmission-Based Precautions: No Implantable device outside of the clinic excluding No Patient Has Alerts: Yes cellular tissue based products placed in the center since last visit: Has Dressing in Place as Prescribed: Yes Has Compression in Place as Prescribed: Yes Pain Present Now: No Electronic Signature(s) Signed: 01/24/2023 3:38:42 PM By: [name] Entered By: [name] on 01/24/2023 08:39:14 -------------------------------------------------------------------------------- Compression Therapy Details Patient Name: Date of Service: [name] Grapes. 01/24/2023 8:30 A M Medical Record Number: [id number] Patient Account Number: [id number] Date of Birth/Sex: Treating RN: 1950-10-10 (72 y.o. [name] Primary Care Tavaras Goody: [name] Other Clinician: Referring Dehlia Kilner: Treating Arria Naim/Extender: [name] in Treatment: 82 Compression  Therapy Performed for Wound Assessment: Wound #18R Left,Plantar Metatarsal head first Performed By: Clinician [name], RN Compression Type: Double Layer Post Procedure Diagnosis Same as Pre-procedure Electronic Signature(s) Signed: 01/24/2023 3:38:42 PM By: Gelene Mink By: [name] on 01/24/2023 09:00:29 [name] ([id number]) [id number] Page 2 of 9 -------------------------------------------------------------------------------- Encounter Discharge Information Details Patient Name: Date of Service: [name], [name] 01/24/2023 8:30 A M Medical Record Number: [id number] Patient Account Number: [id number] Date of Birth/Sex: Treating RN: 12-21-1950 (72 y.o. [name] Primary Care Emelin Dascenzo: [name] Other Clinician: Referring Tregan Read: Treating Hila Bolding/Extender: [name] in Treatment: 47 Encounter Discharge Information Items Post Procedure Vitals Discharge Condition: Stable Temperature (F): 97.9 Ambulatory Status: Ambulatory Pulse (bpm): 67 Discharge Destination: Home Respiratory Rate (breaths/min): 18 Transportation: Private Auto Blood Pressure (mmHg): 183/72 Accompanied By: self Schedule Follow-up Appointment: Yes Clinical Summary of Care: Patient Declined Electronic Signature(s) Signed: 01/24/2023 3:38:42 PM By: [name] Entered By: [name] on 01/24/2023 10:02:14 -------------------------------------------------------------------------------- Lower Extremity Assessment Details Patient Name: Date of Service: [name] Grapes. 01/24/2023 8:30 A M Medical Record Number: [id number] Patient Account Number: [id number] Date of Birth/Sex: Treating RN: 1951-06-10 (72 y.o. [name] Primary Care Marlynn Hinckley: [name] Other Clinician: Referring Byrl Latin: Treating Ezrah Panning/Extender: [name] in Treatment: 93 Edema  Assessment Assessed: [name]: No] [Right: No] Edema: [Left: Ye] [Right: s] Calf Left: Right: Point of Measurement: 41 cm From Medial Instep 48.2 cm Ankle Left: Right: Point of Measurement: 10 cm From Medial Instep 29 cm Vascular Assessment Pulses: Dorsalis Pedis Palpable: [Left:Yes] Extremity colors, hair growth, and conditions: Hair Growth on Extremity: [Left:No] Temperature of Extremity: [Left:Warm] Capillary Refill: [Left:< 3 seconds] Dependent Rubor: [Left:No No] Electronic Signature(s) Signed: 01/24/2023 3:38:42 PM By: [name] Entered By: [name] on 01/24/2023 08:45:36 [name], [name] ([id number]) [id number] Page 3 of 9 -------------------------------------------------------------------------------- Multi Wound Chart Details Patient Name: Date of Service: [name], RISI. 01/24/2023 8:30 A M Medical Record Number:  [id number] Patient Account Number: [id number] Date of Birth/Sex: Treating RN: [birth year] (72 y.o. M) Primary Care Alonna Bartling: [name] Other Clinician: Referring Hassel Uphoff: Treating Zavannah Deblois/Extender: [name] in Treatment: 71 Vital Signs Height(in): 74 Pulse(bpm): 67 Weight(lbs): 238 Blood Pressure(mmHg): 183/72 Body Mass Index(BMI): 30.6 Temperature(F): 97.9 Respiratory Rate(breaths/min): 18 [18R:Photos:] [N/A:N/A] Left, Plantar Metatarsal head first Left, Proximal, Lateral Lower Leg N/A Wound Location: Gradually Appeared Bump N/A Wounding Event: Diabetic Wound/Ulcer of the Lower Cyst N/A Primary Etiology: Extremity Glaucoma, Sleep Apnea, Glaucoma, Sleep Apnea, N/A Comorbid History: Hypertension, Peripheral Arterial Hypertension, Peripheral Arterial Disease, Peripheral Venous Disease,Disease, Peripheral Venous Disease, Type II Diabetes, Gout, Osteoarthritis, Type II Diabetes, Gout, Osteoarthritis, Neuropathy Neuropathy 08/23/2020 06/03/2021 N/A Date Acquired: 64 85 N/A Weeks  of Treatment: Open Open N/A Wound Status: Yes Yes N/A Wound Recurrence: No Yes N/A Clustered Wound: N/A 3 N/A Clustered Quantity: 1.6x2.2x0.2 1.5x1.2x0.1 N/A Measurements L x W x D (cm) 2.765 1.414 N/A A (cm) : rea 0.553 0.141 N/A Volume (cm) : 80.40% 14.30% N/A % Reduction in A rea: 80.40% 89.30% N/A % Reduction in Volume: Grade 2 Full Thickness With Exposed Support N/A Classification: Structures Medium Medium N/A Exudate A mount: Serosanguineous Serous N/A Exudate Type: red, brown amber N/A Exudate Color: Thickened Fibrotic scar, thickened scar N/A Wound Margin: Large (67-100%) Medium (34-66%) N/A Granulation A mount: Red Red, Pink N/A Granulation Quality: Small (1-33%) Medium (34-66%) N/A Necrotic A mount: Fat Layer (Subcutaneous Tissue): Yes Fat Layer (Subcutaneous Tissue): Yes N/A Exposed Structures: Fascia: No Fascia: No Tendon: No Tendon: No Muscle: No Muscle: No Joint: No Joint: No Bone: No Bone: No Small (1-33%) Small (1-33%) N/A Epithelialization: Debridement - Selective/Open Wound Debridement - Selective/Open Wound N/A Debridement: Pre-procedure Verification/Time Out 08:56 08:56 N/A Taken: Lidocaine 4% T opical Solution Lidocaine 4% Topical Solution N/A Pain Control: Callus, Slough Necrotic/Eschar, Slough N/A Tissue Debrided: Non-Viable Tissue Non-Viable Tissue N/A Level: 2.76 1.41 N/A Debridement A (sq cm): rea Curette Curette N/A Instrument: Minimum Minimum N/A Bleeding: Pressure Pressure N/A Hemostasis A chieved: Procedure was tolerated well Procedure was tolerated well N/A Debridement Treatment Response: [name] ([id number]) [id number] Page 4 of 9 1.6x2.2x0.2 1.5x1.2x0.1 N/A Post Debridement Measurements L x W x D (cm) 0.553 0.141 N/A Post Debridement Volume: (cm) Callus: Yes Scarring: Yes N/A Periwound Skin Texture: Maceration: Yes Dry/Scaly: Yes N/A Periwound Skin Moisture: Dry/Scaly:  No No Abnormalities Noted Hemosiderin Staining: Yes N/A Periwound Skin Color: No Abnormality No Abnormality N/A Temperature: Compression Therapy Debridement N/A Procedures Performed: Debridement Treatment Notes Electronic Signature(s) Signed: 01/24/2023 9:32:31 AM By: [name] MD FACS Previous Signature: 01/24/2023 9:26:44 AM Version By: [name] MD FACS Entered By: [name] on 01/24/2023 09:32:31 -------------------------------------------------------------------------------- Multi-Disciplinary Care Plan Details Patient Name: Date of Service: [name] Grapes. 01/24/2023 8:30 A M Medical Record Number: [id number] Patient Account Number: [id number] Date of Birth/Sex: Treating RN: 1950/08/24 (72 y.o. [name] Primary Care Amiera Herzberg: [name] Other Clinician: Referring Merlyn Conley: Treating Taevion Sikora/Extender: [name] in Treatment: 97 Multidisciplinary Care Plan reviewed with physician Active Inactive Venous Leg Ulcer Nursing Diagnoses: Knowledge deficit related to disease process and management Potential for venous Insuffiency (use before diagnosis confirmed) Goals: Patient will maintain optimal edema control Date Initiated: 07/27/2021 Target Resolution Date: 02/23/2023 Goal Status: Active Interventions: Assess peripheral edema status every visit. Treatment Activities: Therapeutic compression applied : 07/27/2021 Notes: Wound/Skin Impairment Nursing Diagnoses: Impaired tissue integrity Knowledge deficit related to ulceration/compromised skin integrity Goals: Patient will have a decrease in wound  volume by X% from date: (specify in notes) Date Initiated: 04/12/2021 Date Inactivated: 01/04/2022 Target Resolution Date: 04/23/2021 Goal Status: Met Patient/caregiver will verbalize understanding of skin care regimen Date Initiated: 01/04/2022 Target Resolution Date: 02/23/2023 Goal Status: Active Ulcer/skin breakdown will  have a volume reduction of 30% by week 4 Date Initiated: 04/12/2021 Date Inactivated: 04/27/2021 Target Resolution Date: 04/27/2021 Goal Status: Unmet Unmet Reason: infection Ulcer/skin breakdown will have a volume reduction of 50% by week 8 [name], [name] ([id number]) [id number] Page 5 of 9 Date Initiated: 04/27/2021 Date Inactivated: 06/29/2021 Target Resolution Date: 06/24/2021 Goal Status: Met Interventions: Assess patient/caregiver ability to obtain necessary supplies Assess patient/caregiver ability to perform ulcer/skin care regimen upon admission and as needed Assess ulceration(s) every visit Notes: Electronic Signature(s) Signed: 01/24/2023 3:38:42 PM By: [name] Entered By: [name] on 01/24/2023 08:59:43 -------------------------------------------------------------------------------- Pain Assessment Details Patient Name: Date of Service: [name] Grapes. 01/24/2023 8:30 A M Medical Record Number: [id number] Patient Account Number: [id number] Date of Birth/Sex: Treating RN: 13-Aug-1950 (72 y.o. [name] Primary Care Jaedon Siler: [name] Other Clinician: Referring Cato Liburd: Treating Loghan Subia/Extender: [name] in Treatment: 56 Active Problems Location of Pain Severity and Description of Pain Patient Has Paino No Site Locations Rate the pain. Current Pain Level: 0 Pain Management and Medication Current Pain Management: Electronic Signature(s) Signed: 01/24/2023 3:38:42 PM By: [name] Entered By: [name] on 01/24/2023 08:39:34 -------------------------------------------------------------------------------- Patient/Caregiver Education Details Patient Name: Date of Service: [name], [name] 8/1/2024andnbsp8:30 A M Medical Record Number: [id number] Patient Account Number: [id number] [name], [name] ([id number]) [phone].pdf Page 6 of 9 Date of  Birth/Gender: Treating RN: 1951-01-24 (72 y.o. [name] Primary Care Physician: [name] Other Clinician: Referring Physician: Treating Physician/Extender: [name] in Treatment: 62 Education Assessment Education Provided To: Patient Education Topics Provided Wound/Skin Impairment: Methods: Explain/Verbal Responses: Reinforcements needed, State content correctly Electronic Signature(s) Signed: 01/24/2023 3:38:42 PM By: [name] Entered By: [name] on 01/24/2023 09:00:06 -------------------------------------------------------------------------------- Wound Assessment Details Patient Name: Date of Service: [name] Grapes. 01/24/2023 8:30 A M Medical Record Number: [id number] Patient Account Number: [id number] Date of Birth/Sex: Treating RN: [birth year] (72 y.o. [name] Primary Care Jasiyah Poland: [name] Other Clinician: Referring Vedanshi Massaro: Treating Nolberto Cheuvront/Extender: [name] in Treatment: 93 Wound Status Wound Number: 18R Primary Diabetic Wound/Ulcer of the Lower Extremity Etiology: Wound Location: Left, Plantar Metatarsal head first Wound Open Wounding Event: Gradually Appeared Status: Date Acquired: 08/23/2020 Comorbid Glaucoma, Sleep Apnea, Hypertension, Peripheral Arterial Disease, Weeks Of Treatment: 93 History: Peripheral Venous Disease, Type II Diabetes, Gout, Osteoarthritis, Clustered Wound: No Neuropathy Photos Wound Measurements Length: (cm) 1.6 Width: (cm) 2.2 Depth: (cm) 0.2 Area: (cm) 2.765 Volume: (cm) 0.553 % Reduction in Area: 80.4% % Reduction in Volume: 80.4% Epithelialization: Small (1-33%) Wound Description Classification: Grade 2 Wound Margin: Thickened Exudate Amount: Medium Exudate Type: Serosanguineous Exudate Color: red, brown ADLAI, GRAEVE ([id number]) Foul Odor After Cleansing: No Slough/Fibrino  Yes [phone].pdf Page 7 of 9 Wound Bed Granulation Amount: Large (67-100%) Exposed Structure Granulation Quality: Red Fascia Exposed: No Necrotic Amount: Small (1-33%) Fat Layer (Subcutaneous Tissue) Exposed: Yes Tendon Exposed: No Muscle Exposed: No Joint Exposed: No Bone Exposed: No Periwound Skin Texture Texture Color No Abnormalities Noted: No No Abnormalities Noted: Yes Callus: Yes Temperature / Pain Temperature: No Abnormality Moisture No Abnormalities Noted: No Dry / Scaly: No Maceration: Yes Treatment Notes Wound #18R (Metatarsal head first) Wound Laterality: Plantar, Left Cleanser  Soap and Water Discharge Instruction: May shower and wash wound with dial antibacterial soap and water prior to dressing change. Wound Cleanser Discharge Instruction: Cleanse the wound with wound cleanser prior to applying a clean dressing using gauze sponges, not tissue or cotton balls. Peri-Wound Care Ketoconazole Cream 2% Discharge Instruction: Apply Ketoconazole as directed Triamcinolone 15 (g) Discharge Instruction: Use triamcinolone 15 (g) as directed Topical Gentamicin Discharge Instruction: As directed by physician Mupirocin Ointment Discharge Instruction: Apply Mupirocin (Bactroban) as instructed Primary Dressing Promogran Prisma Matrix, 4.34 (sq in) (silver collagen) Discharge Instruction: Moisten collagen with saline or hydrogel Secondary Dressing Optifoam Non-Adhesive Dressing, 4x4 in Discharge Instruction: Apply over primary dressing as directed. Woven Gauze Sponges 2x2 in Discharge Instruction: Apply over primary dressing as directed. Zetuvit Absorbent Pad, 4x4 (in/in) Secured With Compression Wrap Urgo K2, (equivalent to a 4 layer) two layer compression system, regular Discharge Instruction: Apply Urgo K2 as directed (alternative to 4 layer compression). Compression Stockings Add-Ons Electronic Signature(s) Signed: 01/24/2023 3:38:42 PM  By: [name] Entered By: [name] on 01/24/2023 08:51:50 [name], [name] ([id number]) [id number] Page 8 of 9 -------------------------------------------------------------------------------- Wound Assessment Details Patient Name: Date of Service: [name], [name] 01/24/2023 8:30 A M Medical Record Number: [id number] Patient Account Number: [id number] Date of Birth/Sex: Treating RN: 1951-01-18 (72 y.o. [name] Primary Care Whitnie Deleon: [name] Other Clinician: Referring Alla Sloma: Treating Carolan Avedisian/Extender: [name] in Treatment: 18 Wound Status Wound Number: 22R Primary Cyst Etiology: Wound Location: Left, Proximal, Lateral Lower Leg Wound Open Wounding Event: Bump Status: Date Acquired: 06/03/2021 Comorbid Glaucoma, Sleep Apnea, Hypertension, Peripheral Arterial Disease, Weeks Of Treatment: 85 History: Peripheral Venous Disease, Type II Diabetes, Gout, Osteoarthritis, Clustered Wound: Yes Neuropathy Photos Wound Measurements Length: (cm) Width: (cm) Depth: (cm) Clustered Quantity: Area: (cm) Volume: (cm) 1.5 % Reduction in Area: 14.3% 1.2 % Reduction in Volume: 89.3% 0.1 Epithelialization: Small (1-33%) 3 Tunneling: No 1.414 Undermining: No 0.141 Wound Description Classification: Full Thickness With Exposed Suppo Wound Margin: Fibrotic scar, thickened scar Exudate Amount: Medium Exudate Type: Serous Exudate Color: amber rt Structures Foul Odor After Cleansing: No Slough/Fibrino Yes Wound Bed Granulation Amount: Medium (34-66%) Exposed Structure Granulation Quality: Red, Pink Fascia Exposed: No Necrotic Amount: Medium (34-66%) Fat Layer (Subcutaneous Tissue) Exposed: Yes Necrotic Quality: Adherent Slough Tendon Exposed: No Muscle Exposed: No Joint Exposed: No Bone Exposed: No Periwound Skin Texture Texture Color No Abnormalities Noted: No No Abnormalities Noted:  No Scarring: Yes Hemosiderin Staining: Yes Moisture Temperature / Pain No Abnormalities Noted: No Temperature: No Abnormality Dry / Scaly: Yes Treatment Notes Wound #22R (Lower Leg) Wound Laterality: Left, Lateral, Proximal [name] ([id number]) [id number] Page 9 of 9 Cleanser Soap and Water Discharge Instruction: May shower and wash wound with dial antibacterial soap and water prior to dressing change. Wound Cleanser Discharge Instruction: Cleanse the wound with wound cleanser prior to applying a clean dressing using gauze sponges, not tissue or cotton balls. Peri-Wound Care Topical Primary Dressing Endoform 2x2 in Discharge Instruction: Moisten with saline Secondary Dressing Woven Gauze Sponge, Non-Sterile 4x4 in Discharge Instruction: Apply over primary dressing as directed. Secured With Elastic Bandage 4 inch (ACE bandage) Discharge Instruction: Secure with ACE bandage as directed. Kerlix Roll Sterile, 4.5x3.1 (in/yd) Discharge Instruction: Secure with Kerlix as directed. Compression Wrap Compression Stockings Add-Ons Electronic Signature(s) Signed: 01/24/2023 3:38:42 PM By: [name] Entered By: [name] on 01/24/2023 08:52:26 -------------------------------------------------------------------------------- Vitals Details Patient Name: Date of Service: [name] Grapes. 01/24/2023 8:30 A M Medical  Record Number: [id number] Patient Account Number: [id number] Date of Birth/Sex: Treating RN: 1950/06/27 (72 y.o. [name] Primary Care Eudell Julian: [name] Other Clinician: Referring Liese Dizdarevic: Treating Gratia Disla/Extender: [name] in Treatment: 27 Vital Signs Time Taken: 08:39 Temperature (F): 97.9 Height (in): 74 Pulse (bpm): 67 Weight (lbs): 238 Respiratory Rate (breaths/min): 18 Body Mass Index (BMI): 30.6 Blood Pressure (mmHg): 183/72 Reference Range: 80 - 120 mg /  dl Electronic Signature(s) Signed: 01/24/2023 3:38:42 PM By: [name] Entered By: [name] on 01/24/2023 40:98:11

## 2023-01-31 ENCOUNTER — Encounter (HOSPITAL_BASED_OUTPATIENT_CLINIC_OR_DEPARTMENT_OTHER): Payer: Medicare Other | Admitting: General Surgery

## 2023-01-31 DIAGNOSIS — E11621 Type 2 diabetes mellitus with foot ulcer: Secondary | ICD-10-CM | POA: Diagnosis not present

## 2023-01-31 NOTE — Progress Notes (Signed)
ESKER, AGARD ([id number]) [id number] Page 1 of 9 Visit Report for 01/31/2023 Arrival Information Details Patient Name: Date of Service: [name], [name] 01/31/2023 10:00 A M Medical Record Number: [id number] Patient Account Number: [id number] Date of Birth/Sex: Treating RN: 1950/08/22 (72 y.o. M) Primary Care : [name] Other Clinician: Referring : Treating /Extender: [name] in Treatment: 40 Visit Information History Since Last Visit All ordered tests and consults were completed: No Patient Arrived: Ambulatory Added or deleted any medications: No Arrival Time: 10:06 Any new allergies or adverse reactions: No Accompanied By: self Had a fall or experienced change in No Transfer Assistance: None activities of daily living that may affect Patient Identification Verified: Yes risk of falls: Secondary Verification Process Completed: Yes Signs or symptoms of abuse/neglect since last visito No Patient Requires Transmission-Based Precautions: No Hospitalized since last visit: No Patient Has Alerts: Yes Implantable device outside of the clinic excluding No cellular tissue based products placed in the center since last visit: Pain Present Now: No Electronic Signature(s) Signed: 01/31/2023 10:22:33 AM By: Dayton Scrape Entered By: Dayton Scrape on 01/31/2023 10:06:32 -------------------------------------------------------------------------------- Compression Therapy Details Patient Name: Date of Service: [name] 01/31/2023 10:00 A M Medical Record Number: [id number] Patient Account Number: [id number] Date of Birth/Sex: Treating RN: 1950-10-31 (72 y.o. [name] Primary Care : [name] Other Clinician: Referring : Treating /Extender: [name] in Treatment: 08 Compression Therapy Performed for Wound Assessment: Wound #18R Left,Plantar  Metatarsal head first Performed By: Clinician [name], RN Compression Type: Double Layer Post Procedure Diagnosis Same as Pre-procedure Electronic Signature(s) Signed: 01/31/2023 11:59:38 AM By: Gelene Mink By: [name] on 01/31/2023 10:25:47 [name] ([id number]) [id number] Page 2 of 9 -------------------------------------------------------------------------------- Encounter Discharge Information Details Patient Name: Date of Service: [name], [name] 01/31/2023 10:00 A M Medical Record Number: [id number] Patient Account Number: [id number] Date of Birth/Sex: Treating RN: [birth year] (72 y.o. [name] Primary Care : [name] Other Clinician: Referring : Treating /Extender: [name] in Treatment: 64 Encounter Discharge Information Items Post Procedure Vitals Discharge Condition: Stable Temperature (F): 98.3 Ambulatory Status: Ambulatory Pulse (bpm): 64 Discharge Destination: Home Respiratory Rate (breaths/min): 18 Transportation: Private Auto Blood Pressure (mmHg): 144/86 Accompanied By: self Schedule Follow-up Appointment: Yes Clinical Summary of Care: Patient Declined Electronic Signature(s) Signed: 01/31/2023 11:59:38 AM By: [name] Entered By: [name] on 01/31/2023 11:58:20 -------------------------------------------------------------------------------- Lower Extremity Assessment Details Patient Name: Date of Service: [name], [name] 01/31/2023 10:00 A M Medical Record Number: [id number] Patient Account Number: [id number] Date of Birth/Sex: Treating RN: 10-23-1950 (72 y.o. [name] Primary Care : [name] Other Clinician: Referring : Treating /Extender: [name] in Treatment: 94 Edema Assessment Assessed: [name]: No] [Right: No] Edema: [Left: Ye] [Right:  s] Calf Left: Right: Point of Measurement: 41 cm From Medial Instep 44.8 cm Ankle Left: Right: Point of Measurement: 10 cm From Medial Instep 27.5 cm Vascular Assessment Pulses: Dorsalis Pedis Palpable: [Left:Yes] Extremity colors, hair growth, and conditions: Hair Growth on Extremity: [Left:No] Temperature of Extremity: [Left:Warm] Capillary Refill: [Left:< 3 seconds] Dependent Rubor: [Left:No No] Electronic Signature(s) Signed: 01/31/2023 11:59:38 AM By: [name] Entered By: [name] on 01/31/2023 10:13:08 [name] ([id number]) [id number] Page 3 of 9 -------------------------------------------------------------------------------- Multi Wound Chart Details Patient Name: Date of Service: [name], [name] 01/31/2023 10:00 A M Medical Record Number: [id number] Patient Account Number: [id number] Date of Birth/Sex:  Treating RN: 04/02/1951 (72 y.o. M) Primary Care : [name] Other Clinician: Referring : Treating /Extender: [name] in Treatment: 3 Vital Signs Height(in): 74 Capillary Blood Glucose(mg/dl): 259 Weight(lbs): 563 Pulse(bpm): 64 Body Mass Index(BMI): 30.6 Blood Pressure(mmHg): 144/86 Temperature(F): 98.3 Respiratory Rate(breaths/min): 18 [18R:Photos:] [N/A:N/A] Left, Plantar Metatarsal head first Left, Proximal, Lateral Lower Leg N/A Wound Location: Gradually Appeared Bump N/A Wounding Event: Diabetic Wound/Ulcer of the Lower Cyst N/A Primary Etiology: Extremity Glaucoma, Sleep Apnea, Glaucoma, Sleep Apnea, N/A Comorbid History: Hypertension, Peripheral Arterial Hypertension, Peripheral Arterial Disease, Peripheral Venous Disease,Disease, Peripheral Venous Disease, Type II Diabetes, Gout, Osteoarthritis, Type II Diabetes, Gout, Osteoarthritis, Neuropathy Neuropathy 08/23/2020 06/03/2021 N/A Date Acquired: 30 86 N/A Weeks of Treatment: Open Open  N/A Wound Status: Yes Yes N/A Wound Recurrence: No Yes N/A Clustered Wound: N/A 3 N/A Clustered Quantity: 2.3x2x0.2 2.8x1x0.1 N/A Measurements L x W x D (cm) 3.613 2.199 N/A A (cm) : rea 0.723 0.22 N/A Volume (cm) : 74.40% -33.40% N/A % Reduction in A rea: 74.40% 83.30% N/A % Reduction in Volume: Grade 2 Full Thickness With Exposed Support N/A Classification: Structures Medium Medium N/A Exudate A mount: Serosanguineous Serous N/A Exudate Type: red, brown amber N/A Exudate Color: Thickened Fibrotic scar, thickened scar N/A Wound Margin: Large (67-100%) Large (67-100%) N/A Granulation A mount: Red Red, Pink N/A Granulation Quality: Small (1-33%) Small (1-33%) N/A Necrotic A mount: Fat Layer (Subcutaneous Tissue): Yes Fat Layer (Subcutaneous Tissue): Yes N/A Exposed Structures: Fascia: No Fascia: No Tendon: No Tendon: No Muscle: No Muscle: No Joint: No Joint: No Bone: No Bone: No Small (1-33%) Small (1-33%) N/A Epithelialization: Debridement - Selective/Open Wound Debridement - Selective/Open Wound N/A Debridement: Pre-procedure Verification/Time Out 10:19 10:19 N/A Taken: Lidocaine 4% T opical Solution Lidocaine 4% Topical Solution N/A Pain Control: Callus, Slough Necrotic/Eschar, Slough N/A Tissue Debrided: Non-Viable Tissue Non-Viable Tissue N/A Level: 3.61 2.2 N/A Debridement A (sq cm): rea Curette Curette N/A Instrument: Minimum Minimum N/A Bleeding: Pressure Pressure N/A Hemostasis A chieved: Procedure was tolerated well Procedure was tolerated well N/A Debridement Treatment Response: [name] ([id number]) [id number] Page 4 of 9 2.3x2x0.2 2.8x1x0.1 N/A Post Debridement Measurements L x W x D (cm) 0.723 0.22 N/A Post Debridement Volume: (cm) Callus: Yes Scarring: Yes N/A Periwound Skin Texture: Maceration: Yes Dry/Scaly: Yes N/A Periwound Skin Moisture: Dry/Scaly: No No Abnormalities Noted  Hemosiderin Staining: Yes N/A Periwound Skin Color: No Abnormality No Abnormality N/A Temperature: Compression Therapy Debridement N/A Procedures Performed: Debridement Treatment Notes Electronic Signature(s) Signed: 01/31/2023 10:48:22 AM By: [name] MD FACS Entered By: [name] on 01/31/2023 10:48:22 -------------------------------------------------------------------------------- Multi-Disciplinary Care Plan Details Patient Name: Date of Service: [name] 01/31/2023 10:00 A M Medical Record Number: [id number] Patient Account Number: [id number] Date of Birth/Sex: Treating RN: [birth year] (72 y.o. [name] Primary Care : [name] Other Clinician: Referring : Treating /Extender: [name] in Treatment: 44 Multidisciplinary Care Plan reviewed with physician Active Inactive Venous Leg Ulcer Nursing Diagnoses: Knowledge deficit related to disease process and management Potential for venous Insuffiency (use before diagnosis confirmed) Goals: Patient will maintain optimal edema control Date Initiated: 07/27/2021 Target Resolution Date: 02/23/2023 Goal Status: Active Interventions: Assess peripheral edema status every visit. Treatment Activities: Therapeutic compression applied : 07/27/2021 Notes: Wound/Skin Impairment Nursing Diagnoses: Impaired tissue integrity Knowledge deficit related to ulceration/compromised skin integrity Goals: Patient will have a decrease in wound volume by X% from date: (specify in notes) Date Initiated: 04/12/2021 Date Inactivated: 01/04/2022 Target  Resolution Date: 04/23/2021 Goal Status: Met Patient/caregiver will verbalize understanding of skin care regimen Date Initiated: 01/04/2022 Target Resolution Date: 02/23/2023 Goal Status: Active Ulcer/skin breakdown will have a volume reduction of 30% by week 4 Date Initiated: 04/12/2021 Date Inactivated:  04/27/2021 Target Resolution Date: 04/27/2021 Goal Status: Unmet Unmet Reason: infection Ulcer/skin breakdown will have a volume reduction of 50% by week 8 Date Initiated: 04/27/2021 Date Inactivated: 06/29/2021 Target Resolution Date: 06/24/2021 [name], [name] ([id number]) [phone].pdf Page 5 of 9 Goal Status: Met Interventions: Assess patient/caregiver ability to obtain necessary supplies Assess patient/caregiver ability to perform ulcer/skin care regimen upon admission and as needed Assess ulceration(s) every visit Notes: Electronic Signature(s) Signed: 01/31/2023 11:59:38 AM By: [name] Entered By: [name] on 01/31/2023 10:23:50 -------------------------------------------------------------------------------- Pain Assessment Details Patient Name: Date of Service: [name] 01/31/2023 10:00 A M Medical Record Number: [id number] Patient Account Number: [id number] Date of Birth/Sex: Treating RN: Nov 09, 1950 (72 y.o. M) Primary Care : [name] Other Clinician: Referring : Treating /Extender: [name] in Treatment: 63 Active Problems Location of Pain Severity and Description of Pain Patient Has Paino No Site Locations Pain Management and Medication Current Pain Management: Electronic Signature(s) Signed: 01/31/2023 10:22:33 AM By: Dayton Scrape Entered By: Dayton Scrape on 01/31/2023 10:06:59 -------------------------------------------------------------------------------- Patient/Caregiver Education Details Patient Name: Date of Service: [name], [name] 8/8/2024andnbsp10:00 A M Medical Record Number: [id number] Patient Account Number: [id number] Date of Birth/Gender: Treating RN: 06-06-1951 (72 y.o. [place], [place], Dola Factor ([id number]) [id number] Page 6 of 9 Primary Care Physician: [name] Other Clinician: Referring Physician: Treating  Physician/Extender: [name] in Treatment: 71 Education Assessment Education Provided To: Patient Education Topics Provided Wound/Skin Impairment: Methods: Explain/Verbal Responses: Reinforcements needed, State content correctly Electronic Signature(s) Signed: 01/31/2023 11:59:38 AM By: [name] Entered By: [name] on 01/31/2023 10:24:04 -------------------------------------------------------------------------------- Wound Assessment Details Patient Name: Date of Service: [name] 01/31/2023 10:00 A M Medical Record Number: [id number] Patient Account Number: [id number] Date of Birth/Sex: Treating RN: [birth year] (72 y.o. M) Primary Care : [name] Other Clinician: Referring : Treating /Extender: [name] in Treatment: 94 Wound Status Wound Number: [id number] Primary Diabetic Wound/Ulcer of the Lower Extremity Etiology: Wound Location: Left, Plantar Metatarsal head first Wound Open Wounding Event: Gradually Appeared Status: Date Acquired: 08/23/2020 Comorbid Glaucoma, Sleep Apnea, Hypertension, Peripheral Arterial Disease, Weeks Of Treatment: 94 History: Peripheral Venous Disease, Type II Diabetes, Gout, Osteoarthritis, Clustered Wound: No Neuropathy Photos Wound Measurements Length: (cm) 2.3 Width: (cm) 2 Depth: (cm) 0.2 Area: (cm) 3.613 Volume: (cm) 0.723 % Reduction in Area: 74.4% % Reduction in Volume: 74.4% Epithelialization: Small (1-33%) Tunneling: No Undermining: No Wound Description Classification: Grade 2 Wound Margin: Thickened Exudate Amount: Medium Exudate Type: Serosanguineous Exudate Color: red, brown [name], [name] ([id number]) Wound Bed Granulation Amount: Large (67-100%) Granulation Quality: Red Necrotic Amount: Small (1-33%) Necrotic Quality: Adherent Slough Foul Odor After Cleansing: No Slough/Fibrino Yes [phone].pdf  Page 7 of 9 Exposed Structure Fascia Exposed: No Fat Layer (Subcutaneous Tissue) Exposed: Yes Tendon Exposed: No Muscle Exposed: No Joint Exposed: No Bone Exposed: No Periwound Skin Texture Texture Color No Abnormalities Noted: No No Abnormalities Noted: Yes Callus: Yes Temperature / Pain Temperature: No Abnormality Moisture No Abnormalities Noted: No Dry / Scaly: No Maceration: Yes Treatment Notes Wound #18R (Metatarsal head first) Wound Laterality: Plantar, Left Cleanser Soap and Water Discharge Instruction: May shower and wash wound with dial antibacterial soap and water prior to  dressing change. Wound Cleanser Discharge Instruction: Cleanse the wound with wound cleanser prior to applying a clean dressing using gauze sponges, not tissue or cotton balls. Peri-Wound Care Ketoconazole Cream 2% Discharge Instruction: Apply Ketoconazole as directed Triamcinolone 15 (g) Discharge Instruction: Use triamcinolone 15 (g) as directed Topical Gentamicin Discharge Instruction: As directed by physician Mupirocin Ointment Discharge Instruction: Apply Mupirocin (Bactroban) as instructed Primary Dressing Maxorb Extra Ag+ Alginate Dressing, 2x2 (in/in) Discharge Instruction: Apply to wound bed as instructed Secondary Dressing Optifoam Non-Adhesive Dressing, 4x4 in Discharge Instruction: Apply over primary dressing as directed. Woven Gauze Sponges 2x2 in Discharge Instruction: Apply over primary dressing as directed. Zetuvit Absorbent Pad, 4x4 (in/in) Secured With Compression Wrap Urgo K2, (equivalent to a 4 layer) two layer compression system, regular Discharge Instruction: Apply Urgo K2 as directed (alternative to 4 layer compression). Compression Stockings Add-Ons Electronic Signature(s) Signed: 01/31/2023 11:59:38 AM By: [name] Entered By: [name] on 01/31/2023 10:15:30 [name] ([id number]) [id number] Page 8 of  9 -------------------------------------------------------------------------------- Wound Assessment Details Patient Name: Date of Service: [name], [name] 01/31/2023 10:00 A M Medical Record Number: [id number] Patient Account Number: [id number] Date of Birth/Sex: Treating RN: 1951/01/23 (72 y.o. M) Primary Care : [name] Other Clinician: Referring : Treating /Extender: [name] in Treatment: 94 Wound Status Wound Number: 22R Primary Cyst Etiology: Wound Location: Left, Proximal, Lateral Lower Leg Wound Open Wounding Event: Bump Status: Date Acquired: 06/03/2021 Comorbid Glaucoma, Sleep Apnea, Hypertension, Peripheral Arterial Disease, Weeks Of Treatment: 86 History: Peripheral Venous Disease, Type II Diabetes, Gout, Osteoarthritis, Clustered Wound: Yes Neuropathy Photos Wound Measurements Length: (cm) Width: (cm) Depth: (cm) Clustered Quantity: Area: (cm) Volume: (cm) 2.8 % Reduction in Area: -33.4% 1 % Reduction in Volume: 83.3% 0.1 Epithelialization: Small (1-33%) 3 Tunneling: No 2.199 Undermining: No 0.22 Wound Description Classification: Full Thickness With Exposed Support Structures Wound Margin: Fibrotic scar, thickened scar Exudate Amount: Medium Exudate Type: Serous Exudate Color: amber Foul Odor After Cleansing: No Slough/Fibrino Yes Wound Bed Granulation Amount: Large (67-100%) Exposed Structure Granulation Quality: Red, Pink Fascia Exposed: No Necrotic Amount: Small (1-33%) Fat Layer (Subcutaneous Tissue) Exposed: Yes Necrotic Quality: Adherent Slough Tendon Exposed: No Muscle Exposed: No Joint Exposed: No Bone Exposed: No Periwound Skin Texture Texture Color No Abnormalities Noted: No No Abnormalities Noted: No Scarring: Yes Hemosiderin Staining: Yes Moisture Temperature / Pain No Abnormalities Noted: Yes Temperature: No Abnormality Treatment Notes Wound #22R (Lower Leg) Wound  Laterality: Left, Lateral, Proximal Cleanser Soap and Water Discharge Instruction: May shower and wash wound with dial antibacterial soap and water prior to dressing change. Wound Cleanser Discharge Instruction: Cleanse the wound with wound cleanser prior to applying a clean dressing using gauze sponges, not tissue or cotton balls. [name], [name] ([id number]) [id number] Page 9 of 9 Peri-Wound Care Topical Primary Dressing Endoform 2x2 in Discharge Instruction: Moisten with saline Secondary Dressing Woven Gauze Sponge, Non-Sterile 4x4 in Discharge Instruction: Apply over primary dressing as directed. Secured With Elastic Bandage 4 inch (ACE bandage) Discharge Instruction: Secure with ACE bandage as directed. Kerlix Roll Sterile, 4.5x3.1 (in/yd) Discharge Instruction: Secure with Kerlix as directed. Compression Wrap Compression Stockings Add-Ons Electronic Signature(s) Signed: 01/31/2023 11:59:38 AM By: [name] Entered By: [name] on 01/31/2023 10:16:13 -------------------------------------------------------------------------------- Vitals Details Patient Name: Date of Service: [name] 01/31/2023 10:00 A M Medical Record Number: [id number] Patient Account Number: [id number] Date of Birth/Sex: Treating RN: 03-25-1951 (72 y.o. M) Primary Care : [name] Other Clinician: Referring : Treating  /Extender: [name] in Treatment: 94 Vital Signs Time Taken: 10:06 Temperature (F): 98.3 Height (in): 74 Pulse (bpm): 64 Weight (lbs): 238 Respiratory Rate (breaths/min): 18 Body Mass Index (BMI): 30.6 Blood Pressure (mmHg): 144/86 Capillary Blood Glucose (mg/dl): 324 Reference Range: 80 - 120 mg / dl Electronic Signature(s) Signed: 01/31/2023 10:22:33 AM By: Dayton Scrape Entered By: Dayton Scrape on 01/31/2023 10:06:53

## 2023-02-01 NOTE — Progress Notes (Signed)
DALLES, SAUCER ([id number]) [id number] Page 1 of 22 Visit Report for 01/31/2023 Chief Complaint Document Details Patient Name: Date of Service: [name] 01/31/2023 10:00 A M Medical Record Number: [id number] Patient Account Number: [id number] Date of Birth/Sex: Treating RN: 26-Feb-1951 (72 y.o. M) Primary Care Provider: Ralene [name] Other Clinician: Referring Provider: Treating Provider/Extender: [name] in Treatment: 94 Information Obtained from: Patient Chief Complaint Left leg and foot ulcers 04/12/2021; patient is here for wounds on his left lower leg and left plantar foot over the first metatarsal head Electronic Signature(s) Signed: 01/31/2023 10:48:36 AM By: [name] Guess MD FACS Entered By: [name] on 01/31/2023 10:48:35 -------------------------------------------------------------------------------- Debridement Details Patient Name: Date of Service: [name]. 01/31/2023 10:00 A M Medical Record Number: [id number] Patient Account Number: [id number] Date of Birth/Sex: Treating RN: 1951-01-04 (72 y.o. [name] Primary Care Provider: Ralene [name] Other Clinician: Referring Provider: Treating Provider/Extender: [name] in Treatment: 94 Debridement Performed for Assessment: Wound #22R Left,Proximal,Lateral Lower Leg Performed By: Physician [name] Guess, MD Debridement Type: Debridement Level of Consciousness (Pre-procedure): Awake and Alert Pre-procedure Verification/Time Out Yes - 10:19 Taken: Start Time: 10:19 Pain Control: Lidocaine 4% Topical Solution Percent of Wound Bed Debrided: 100% T Area Debrided (cm): otal 2.2 Tissue and other material debrided: Non-Viable, Eschar, Slough, Slough Level: Non-Viable Tissue Debridement Description: Selective/Open Wound Instrument: Curette Bleeding: Minimum Hemostasis Achieved: Pressure Response to Treatment:  Procedure was tolerated well Level of Consciousness (Post- Awake and Alert procedure): Post Debridement Measurements of Total Wound Length: (cm) 2.8 Width: (cm) 1 Depth: (cm) 0.1 Volume: (cm) 0.22 Character of Wound/Ulcer Post Debridement: Improved Post Procedure Diagnosis Same as [name] ([id number]) [id number] Page 2 of 22 Notes scribed for [name] by [name] Bruin, RN Electronic Signature(s) Signed: 01/31/2023 11:59:38 AM By: [name] Signed: 02/01/2023 9:04:34 AM By: [name] Guess MD FACS Entered By: [name] on 01/31/2023 10:22:37 -------------------------------------------------------------------------------- Debridement Details Patient Name: Date of Service: [name]. 01/31/2023 10:00 A M Medical Record Number: [id number] Patient Account Number: [id number] Date of Birth/Sex: Treating RN: [birth year] (72 y.o. [name] Primary Care Provider: Ralene [name] Other Clinician: Referring Provider: Treating Provider/Extender: [name] in Treatment: 94 Debridement Performed for Assessment: Wound #18R Left,Plantar Metatarsal head first Performed By: Physician [name] Guess, MD Debridement Type: Debridement Severity of Tissue Pre Debridement: Fat layer exposed Level of Consciousness (Pre-procedure): Awake and Alert Pre-procedure Verification/Time Out Yes - 10:19 Taken: Start Time: 10:19 Pain Control: Lidocaine 4% T opical Solution Percent of Wound Bed Debrided: 100% T Area Debrided (cm): otal 3.61 Tissue and other material debrided: Non-Viable, Callus, Slough, Slough Level: Non-Viable Tissue Debridement Description: Selective/Open Wound Instrument: Curette Specimen: Tissue Culture Number of Specimens T aken: 1 Bleeding: Minimum Hemostasis Achieved: Pressure Response to Treatment: Procedure was tolerated well Level of Consciousness (Post- Awake and  Alert procedure): Post Debridement Measurements of Total Wound Length: (cm) 2.3 Width: (cm) 2 Depth: (cm) 0.2 Volume: (cm) 0.723 Character of Wound/Ulcer Post Debridement: Improved Severity of Tissue Post Debridement: Fat layer exposed Post Procedure Diagnosis Same as Pre-procedure Notes scribed for [name] by [name] Bruin, RN Electronic Signature(s) Signed: 01/31/2023 11:59:38 AM By: [name] Signed: 02/01/2023 9:04:34 AM By: [name] Guess MD FACS Entered By: [name] on 01/31/2023 10:25:02 [name] ([id number]) [id number] Page 3 of 22 -------------------------------------------------------------------------------- HPI Details Patient Name: Date of Service: [name], [name] 01/31/2023 10:00 A M Medical Record Number: [id number]  Patient Account Number: [id number] Date of Birth/Sex: Treating RN: 1950/10/02 (72 y.o. M) Primary Care Provider: Ralene [name] Other Clinician: Referring Provider: Treating Provider/Extender: [name] in Treatment: 46 History of Present Illness HPI Description: 10/11/17; [name] is a 72 year old man who tells me that in 2015 he slipped down the latter traumatizing his left leg. He developed a wound in the same spot the area that we are currently looking at. He states this closed over for the most part although he always felt it was somewhat unstable. In 2016 he hit the same area with the door of his car had this reopened. He tells me that this is never really closed although sometimes an inflow it remains open on a constant basis. He has not been using any specific dressing to this except for topical antibiotics the nature of which were not really sure. His primary doctor did send him to see Dr. Jacinto [name] of interventional cardiology. He underwent an angiogram on 08/06/17 and he underwent a PTA and directional atherectomy of the lesser distal SFA and popliteal arteries which  resulted in brisk improvement in blood flow. It was noted that he had 2 vessel runoff through the anterior tibial and peroneal. He is also been to see vascular and interventional radiologist. He was not felt to have any significant superficial venous insufficiency. Presumably is not a candidate for any ablation. It was suggested he come here for wound care. The patient is a type II diabetic on insulin. He also has a history of venous insufficiency. ABIs on the left were noncompressible in our clinic 10/21/17; patient we admitted to the clinic last week. 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 We put Iodosorb on him after an aggressive debridement and 3 layer compression. He complained of pain in his ankle and itching with is skin in fact he scratched the area on the medial calf superiorly at the rim of our wraps and he has 2 small open areas in that location today which are new. I changed his primary dressing today to silver collagen. As noted he is already had revascularization and does not have any significant superficial venous insufficiency that would be amenable to ablation 10/28/17; patient admitted to the clinic 2 weeks ago. He has a smaller Wound. Scratch injury from last week revealed. There is large wound over the tibial area. This is smaller. Granulation looks healthy. No need for debridement. 11/04/17; the wound on the left lateral calf looks better. Improved dimensions. Surface of this looks better. We've been maintaining him and Kerlix Coban wraps. He finds this much more comfortable. Silver collagen dressing 11/11/17; left lateral Wound continues to look healthy be making progress. Using a #5 curet I removed removed nonviable skin from the surface of the wound and then necrotic debris from the wound surface. Surface of the wound continues to look healthy. He also has an open area on the left great toenail bed. We've been  using topical antibiotics. 11/19/17; left anterior lateral wound continues to look healthy but it's not closed. He also had a small wound above this on the left leg Initially traumatic wounds in the setting of significant chronic venous insufficiency and stasis dermatitis 11/25/17; left anterior wounds superiorly is closed still a small wound inferiorly. 12/02/17; left anterior tibial area. Arrives today with adherent callus. Post debridement clearly not completely closed. Hydrofera Blue under 3 layer compression. 12/09/17; left anterior tibia. Circumferential eschar  however the wound bed looks stable to improved. We've been using Hydrofera Blue under 3 layer compression 12/17/17; left anterior tibia. Apparently this was felt to be closed however when the wrap was taken off there is a skin tear to reopen wounds in the same area we've been using Hydrofera Blue under 3 layer compression 12/23/17 left anterior tibia. Not close to close this week apparently the [facility] was stuck to this again. Still circumferential eschar requiring debridement. I put a contact layer on this this time under the Hydrofera Blue 12/31/17; left anterior tibia. Wound is better slight amount of hyper-granulation. Using Hydrofera Blue over Adaptic. 01/07/18; left anterior tibia. The wound had some surface eschar however after this was removed he has no open wound.he was already revascularized by Dr. Jacinto [name] when he came to our clinic with atherectomy of the left SFA and popliteal artery. He was also sent to interventional radiology for venous reflux studies. He was not felt to have significant reflux but certainly has chronic venous changes of his skin with hemosiderin deposition around this area. He will definitely need to lubricate his skin and wear compression stocking and I've talked to him about this. READMISSION 05/26/2018 This is a now 72 year old man we cared for with traumatic wounds on his left anterior lower extremity. He  had been previously revascularized during that admission by Dr. Jacinto [name]. Apparently in follow-up Dr. Jacinto [name] noted that he had deterioration in his arterial status. He underwent a stent placement in the distal left SFA on 04/22/2018. 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 He went back to the angiography suite on 04/30/2018 he underwent PTA and balloon angioplasty of the occluded left mid anterior tibial artery, thrombotic occlusion went from 100 to 0% which reconstitutes the posterior tibial artery. He had thrombectomy and aspiration of the peroneal artery. The stent placed in the distal SFA left SFA was still occluded. 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 He has been recuperating at [facility] home in [facility] after the most recent hospitalization. He is going home tomorrow. He tells me that 2 weeks ago he traumatized the tip of his left fifth toe. He came in urgently for our review of this. This was a history of before I noted that Dr. Jacinto [name] had already noted dry gangrenous changes of the left fifth toe 06/09/2018; 2-week follow-up. I did contact Dr. Jacinto [name] after his last appointment and he apparently saw 1 of Dr. Verl Dicker colleagues the next day. He does not follow-up with Dr. Jacinto [name] himself until Thursday of this week. 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 Nevertheless there is no evidence of infection no drainage and no pain. He had a new area that this week when we were signing him in today on the left anterior mid tibia area, this is in close proximity to the previous wound we have dealt with in this clinic. 06/23/2018; 2-week follow-up. I did not receive a recent note from Dr. Jacinto [name] to review today.  Our office is trying to obtain this. He is apparently not planning to do further vascular interventions and wondered about compression to try and help with the patient's chronic venous insufficiency. However we are also concerned about  the arterial flow. He arrives in clinic today with a new area on the left third toe. The areas on the calf/anterior tibia are close to closing. The left fifth toe is still mummified using Betadine. -In reviewing things with the patient he has what sounds like claudication with mild to moderate amount of activity. 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 I prescribed Levaquin over the phone while we attempted to arrange a plan of care. However the patient called yesterday to report he had low-grade fever and he came in today acutely. There is been a marked deterioration in the left third toe with spreading cellulitis up into the dorsal left foot. He was referred to the emergency room. Readmission: 06/29/2020 patient presents today for reevaluation here in our clinic he was previously treated by Dr. Leanord Hawking at the latter part of 2019 in 2 the beginning of 2020. Subsequently we have not seen him since that time in the interim he did have evaluation with vein and vascular specialist specifically Dr. Bo Mcclintock who did perform quite extensive work for a left femoral to anterior tibial artery bypass. With that being said in the interim the patient has developed significant [name], [name] ([id number]) [id number] Page 4 of 22 lymphedema and has wounds that he tells me have really never healed in regard to the incision site on the left leg. He also has multiple wounds on the feet for various reasons some of which is that he tends to pick at his feet. Fortunately there is no signs of active infection systemically at this time he does have some wounds that are little bit deeper but most are fairly superficial he seems  to have good blood flow and overall everything appears to be healthy I see no bone exposed and no obvious signs of osteomyelitis. I do not know that he necessarily needs a x-ray at this point although that something we could consider depending on how things progress. The patient does have a history of lymphedema, diabetes, this is type II, chronic kidney disease stage III, hypertension, and history of peripheral vascular disease. 07/05/2020; patient admitted last week. Is a patient I remember from 2019 he had a spreading infection involving the left foot and we sent him to the hospital. 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 He subsequently had a left femoral to anterior tibial bypass by [facility]. He also has severe lymphedema with chronic skin changes related to that on the left leg. The most problematic area that was new today was on the left medial great toe. This was apparently a small area last week there was purulent drainage which our intake nurse cultured. Also areas on the left medial foot and heel left lateral foot. He has 2 areas on the left medial calf left lateral calf in the setting of the severe lymphedema. 07/13/2020 on evaluation today patient appears to be doing better in my opinion compared to his last visit. The good news is there is no signs of active infection systemically and locally I do not see any signs of infection either. He did have an x-ray which was negative that is great news he had a culture which showed MRSA but at the same time he is been on the doxycycline which has helped. I do think we may want to extend this for 7 additional days 1/25; patient admitted to the clinic a few weeks ago. 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 We have been putting him under compression his edema control is a lot better but he is severe verricused skin on the left leg. He is really  done quite well he still has an open area on the left medial calf and the left medial first metatarsal head. We have been using silver collagen on the leg silver alginate on the foot 07/27/2020 upon evaluation today patient appears to be doing decently well in regard to his wounds. He still has a lot of dry skin on the left leg. Some of this is starting to peel back and I think he may be able to have them out by removing some that today. Fortunately there is no signs of active infection at this time on the left leg although on the right leg he does appear to have swelling and erythema as well as some mild warmth to touch. This does have been concerned about the possibility of cellulitis although within the differential diagnosis I do think that potentially a DVT has to be at least considered. We need to rule that out before proceeding would just call in the cellulitis. Especially since he is having pain in the posterior aspect of his calf muscle. 2/8; the patient had seen sparingly. He has severe skin changes of chronic lymphedema in the left leg thickened hyperkeratotic verrucous skin. He has an open wound on the medial part of the left first met head left mid tibia. He also has a rim of nonepithelialized skin in the anterior mid tibia. He brought in the AmLactin lotion that was been prescribed although I am not sure under compression and its utility. There concern about cellulitis on the right lower leg the last time he was here. He was put on on antibiotics. His DVT rule out was negative. The right leg looks fine he is using his stocking on this area 08/10/2020 upon evaluation today patient appears to be doing well with regard to his leg currently. He has been tolerating the dressing changes without complication. Fortunately there is no signs of active infection which is great news. Overall very pleased with where things stand. 2/22; the patient still has an area on the medial part of the left first  met his head. This looks better than when I last saw this earlier this month he has a rim of epithelialization but still some surface debris. Mostly everything on the left leg is healed. There is still a vulnerable in the left mid tibia area. 08/30/2020 upon evaluation today patient appears to be doing much better in regard to his wounds on his foot. 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 Nonetheless I think we will address that today I Minna send in a prescription for him in that regard. Overall though there does not appear to be any signs of significant worsening. 09/07/2020 on evaluation today patient's wounds over his left foot appear to be doing excellent. I do not see any signs of infection there is some callus buildup this can require debridement for certain but overall I feel like he is managing quite nicely. He still using the AmLactin cream which has been beneficial for him as well. 3/22; left foot wound is closed. There is no open area here. He is using ammonium lactate lotion to the lower extremities  to help exfoliate dry cracked skin. He has compression stockings from elastic therapy in [place]. The wound on the medial part of his left first met head is healed today. READMISSION 04/12/2021 Mr. Fosse is a patient we know fairly well he had a prolonged stay in clinic in 2019 with wounds on his left lateral and left anterior lower extremity in the setting of chronic venous insufficiency. More recently he was here earlier this year with predominantly an area on his left foot first metatarsal head plantar and he says the plantar foot broke down on its not long after we discharged him but he did not come back here. The last few months areas of broken down on his left anterior and again the left lateral lower extremity. The leg itself is very swollen  chronically enlarged a lot of hyperkeratotic dry Berry Q skin in the left lower leg. His edema extends well into the thigh. He was seen by Dr. Randie Heinz. He had ABIs on 03/02/2021 showing an ABI on the right of 1 with a TBI of 0.72 his ABI in the left at 1.09 TBI of 0.99. Monophasic and biphasic waveforms on the right. On the left monophasic waveforms were noted he went on to have an angiogram on 03/27/2021 this showed the aortic aortic and iliac segments were free of flow-limiting stenosis the left common femoral vein to evaluate the left femoral to anterior tibial artery bypass was unobstructed the bypass was patent without any areas of stenosis. We discharged the patient in bilateral juxta lite stockings but very clearly that was not sufficient to control the swelling and maintain skin integrity. He is clearly going to need compression pumps. 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 This is fortunate because he is on his feet for long periods of time. 10/27; patient comes in with our intake nurse reporting copious amount of green drainage from the left anterior mid tibia the left dorsal foot and to a lesser extent the left medial mid tibia. We left the compression wrap on all week for the amount of edema in his left leg is quite a bit better. We use silver alginate as the primary dressing 11/3; edema control is good. Left anterior lower leg left medial lower leg and the plantar first metatarsal head. The left anterior lower leg required debridement. Deep tissue culture I did of this wound showed MRSA I put him on 10 days of doxycycline which she will start today. We have him in compression wraps. He has a security card and [employer] however he is retiring on November 15. We will need to then get him into a better offloading boot for the left foot perhaps a total contact cast 11/10; edema control is quite good. Left anterior and left medial lower leg  wounds in the setting of chronic venous insufficiency and lymphedema. He also has a substantial area over the left plantar first metatarsal head. 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 He has significant hypergranulation on the left plantar foot wound. The patient is a diabetic but he does not have significant PAD 11/17; edema control is quite good. Left anterior and left medial lower leg wounds look better. The really concerning area remains the area on the left plantar first metatarsal head. He has a rim of epithelialization. He has been using a  surgical shoe The patient is now retired from a a [employer] I have gone over with him the need to offload this area aggressively. Starting today with a forefoot off loader but . possibly a total contact cast. 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 Arrives with a very significant odor from I think all of the wounds on the left leg and the left foot. Because of this I did not put a total contact cast on him today but will could still consider this. His wife was having cataract surgery which is the reason he missed the appointment 12/6. I saw this man 5 days ago with a swelling below the popliteal fossa. I thought he actually might have a Baker's cyst however the DVT rule out study that we could arrange right away was negative the technician told me this was not a ruptured Baker's cyst. We attempted to get this aspirated by under ultrasound guidance in interventional radiology however all they did was an ultrasound however it shows an extensive fluid collection 62 x 8 x 9.4 in the left thigh and left calf. The patient states he thinks this started 8 days ago or so but he really is not complaining of any pain, fever or systemic symptoms. He has not  ha [name], [name] ([id number]) [id number] Page 5 of 22 12/20; after some difficulty I managed to get the patient into see Dr. Randie Heinz. Eventually he was taken into the hospital and had a drain put in the fluid collection below his left knee posteriorly extending into the posterior thigh. He still has the drain in place. Culture of this showed moderate staff aureus few Morganella and few Klebsiella he is now on doxycycline and ciprofloxacin as suggested by infectious disease he is on this for a month. The drain will remain in place until it stops draining 12/29; he comes in today with the 1 wound on his left leg and the area on the left plantar first met head significantly smaller. Both look healthy. He still has the drain in the left leg. He says he has to change this daily. Follows up with Dr. Randie Heinz on January 11. 06/29/2021; the wounds that I am following on the left leg and left first met head continued to be quite healthy. However the area where his inferior drain is in place had copious amounts of drainage which was green in color. The wound here is larger. Follows up with Dr. Pascal Lux of vein and vascular his surgeon next week as well as infectious disease. He remains on ciprofloxacin and doxycycline. He is not complaining of excessive pain in either one of the drain areas 1/12; the patient saw vascular surgery and infectious disease. Vascular surgery has left the drain in place as there was still some notable drainage still see him back in 2 weeks. Dr. Dorthula Perfect stop the doxycycline and ciprofloxacin and I do not believe he follows up with them at this point. Culture I did last week showed both doxycycline resistant MRSA and Pseudomonas not sensitive to ciprofloxacin although only in rare titers 1/19; the patient's wound on the left anterior lower leg is just about healed. We have continued healing of the area that was medially on the left leg. Left first plantar metatarsal head  continues to get smaller. The major problem here is his 2 drain sites 1 on the left  upper calf and lateral thigh. There is purulent drainage still from the left lateral thigh. I gave him antibiotics last week but we still have recultured. He has the drain in the area I think this is eventually going to have to come out. I suspect there will be a connecting wound to heal here perhaps with improved VAc 1/26; the patient had his drain removed by vein and vascular on 1/25/. This was a large pocket of fluid in his left thigh that seem to tunnel into his left upper calf. 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 His mention his Penrose drain was removed today. He now has a tunneling wound on his left calf and left thigh. Both of these probe widely towards each other although I cannot really prove that they connect. Both wounds on his lower leg anteriorly are closed and his area over the first metatarsal head on his right foot continues to improve. We are using Hydrofera Blue here. He also saw infectious disease culture of the abscess they noted was polymicrobial with MRSA, Morganella and Klebsiella he was treated with doxycycline and ciprofloxacin for 4 weeks ending on 07/03/2021. They did not recommend any further antibiotics. Notable that while he still had the Penrose drain in place last week he had purulent drainage coming out of the inferior IandD site this grew [facility], MRSA and Pseudomonas but there does not appear to be any active infection in this area today with the drain out and he is not systemically unwell 2/2; with regards to the drain sites the superior one on the thigh actually is closed down the one on the upper left lateral calf measures about 8 and half centimeters which is an improvement seems to be less prominent although still with a lot of drainage. The only remaining wound is over the first metatarsal head on the left foot and this looks to be continuing to  improve with Hydrofera Blue. 2/9; the area on his plantar left foot continues to contract. Callus around the wound edge. The drain sites specifically have not come down in depth. We put the wound VAC on Monday he changed the canister late last night our intake nurse reported a pocket of fluid perhaps caused by our compression wraps 2/16; continued improvement in left foot plantar wound. drainage site in the calf is not improved in terms of depth (wound vac) 2/23; continued improvement in the left foot wound over the first metatarsal head. With regards to the drain sites the area on his thigh laterally is healed however the open area on his calf is small in terms of circumference by still probes in by about 15 cm. Within using the wound VAC. Hydrofera Blue on his foot 08/24/2021: The left first metatarsal head wound continues to improve. The wound bed is healthy with just some surrounding callus. Unfortunately the open drain site on his calf remains open and tunnels at least 15 cm (the extent of a Q-tip). This is despite several weeks of wound VAC treatment. Based on reading back through the notes, there has been really no significant change in the depth of the wound, although the orifice is smaller and the more cranial wound on his thigh has closed. I suspect the tunnel tracks nearly all the way to this location. 08/31/2021: Continued improvement in the left first metatarsal head wound. There has been absolutely no improvement to the long tunnel from his open drain site on his calf. We have tried to get  him into see vascular surgery sooner to consider the possibility of simply filleting the tract open and allowing it to heal from the bottom up, likely with a wound VAC. They have not yet scheduled a sooner appointment than his current mid April 09/14/2021: He was seen by vascular surgery and they took him to the operating room last week. They opened a portion of the tunnel, but did not extend the entire  length of the known open subcutaneous tract. I read Dr. Darcella Cheshire operative note and it is not clear from that documentation why only a portion of the tract was opened. The heaped up granulation tissue was curetted and removed from at least some portion of the tract. They did place a wound VAC and applied an Unna boot to the leg. The ulcer on his left first metatarsal head is smaller today. The bed looks good and there is just a small amount of surrounding callus. 09/21/2021: The ulcer on his left first metatarsal head looks to be stalled. There is some callus surrounding the wound but the wound bed itself does not appear particularly dynamic. The tunnel tract on his lateral left leg seems to be roughly the same length or perhaps slightly smaller but the wound bed appears healthy with good granulation tissue. He opened up a new wound on his medial thigh and the site of a prior surgical incision. He says that he did this unconsciously in his sleep by scratching. 09/28/2021: Unfortunately, the ulcer on his left first metatarsal head has extended underneath the callus toward the dorsum of his foot. The medial thigh wounds are roughly the same. The tunnel on his lateral left leg continues to be problematic; it is longer than we are able to actually probe with a Q-tip. I am still not certain as to why Dr. Randie Heinz did not open this up entirely when he took the patient to the operating room. We will likely be back in the same situation with just a small superficial opening in a long unhealed tract, as the open portion is granulating in nicely. 10/02/2021: The patient was initially scheduled for a nurse visit, but we are also applying a total contact cast today. The plantar foot wound looks clean without significant accumulated callus. We have been applying Prisma silver collagen to the site. 10/05/2021: The patient is here for his first total contact cast change. We have tried using gauze packing strips in the tunnel on  his lateral leg wound, but this does not seem to be working any better than the white VAC foam. The foot ulcer looks about the same with minimal periwound callus. Medial thigh wound is clean with just some overlying eschar. 10/12/2021: The plantar foot wound is stable without any significant accumulation of periwound callus. The surface is viable with good granulation tissue. The medial thigh wounds are much smaller and are epithelializing. On the other hand, he had purulent drainage coming from the tunnel on his lateral leg. He does go back to see Dr. Randie Heinz next week and is planning to ask him why the wound tunnel was not completely opened at the time of his most recent operation. 10/19/2021: The plantar foot wound is markedly improved and has epithelial tissue coming through the surface. The medial thigh wounds are nearly closed with just a tiny open area. He did see Dr. Randie Heinz earlier this week and apparently they did discuss the possibility of opening the sinus tract further and enabling a wound VAC application. Apparently there are some limits  as to what Dr. 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 I think if we could get the tract open to the level of the popliteal fossa, this would greatly aid in her ability to get this chart closed. That being said, however, today when I probed the tract with a Q-tip, I was not able to insert the entirety of the Q-tip as I have on previous occasions. The tunnel is shorter by about 4 cm. The surface is clean with good granulation tissue and no further episodes of purulent drainage. 10/30/2021: Last week, the patient underwent surgery and had the long tract in his leg opened. There was a rind that was debrided, according to the operative report. His medial thigh ulcers are closed. The plantar foot wound is clean with a good surface and some built up surrounding callus. [name], [name] ([id number])  [id number] Page 6 of 22 11/06/2021: The overall dimensions of the large wound on his lateral leg remain about the same, but there is good granulation tissue present and the tunneling is a little bit shorter. He has a new wound on his anterior tibial surface, in the same location where he had a similar lesion in the past. The plantar foot wound is clean with some buildup surrounding callus. Just toward the medial aspect of his foot, however, there is an area of darkening that once debrided, revealed another opening in the skin surface. 11/13/2021: The anterior tibial surface wound is closed. The plantar foot wound has some surrounding callus buildup. The area of darkening that I debrided last week and revealed an opening in the skin surface has closed again. The tunnel in the large wound on his lateral leg has come in by about 3 cm. There is healthy granulation tissue on the entire wound surface. 11/23/2021: The patient was out of town last week and did wet-to-dry dressings on his large wound. 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 Unfortunately, he picked open the wound on his left medial thigh. He says that it was itching and he just could not stop scratching it until it was open again. The wound on his plantar foot is smaller and has not accumulated a tremendous amount of callus. The lateral leg wound is shallower and the tunnel has also decreased in depth. There is just a little bit of slough accumulation on the surface. 11/30/2021: Another portion of his left medial thigh has been opened up. All of these wounds are fairly superficial with just a little bit of slough and eschar accumulation. The wound on his plantar foot is almost closed with just a bit of eschar and periwound callus accumulation. The lateral leg wound is nearly flush with the surrounding skin and the tunnel is markedly  shallower. 12/07/2021: There is just 1 open area on his left medial thigh. It is clean with just a little bit of perimeter eschar. The wound on his plantar foot continues to contract and just has some eschar and periwound callus accumulation. The lateral leg wound is closing at the more distal aspect and the tunnel is smaller. The surface is nearly flush with the surrounding skin and it has a good bed of granulation tissue. 12/14/2021: The thigh and foot wounds are closed. The lateral leg wound has closed over approximately half of its length. The tunnel continues to contract and the surface is now flush with the surrounding skin. The wound bed has robust  granulation tissue. 12/22/2021: The thigh and foot wounds have reopened. The foot wound has a lot of callus accumulation around and over it. The thigh wound is tiny with just a little bit of slough in the wound bed. The lateral leg wound continues to contract. His vascular surgeon took the wound VAC off earlier in the week and the patient has been doing wet-to-dry dressings. There is a little slough accumulation on the surface. The tunnel is about 3 cm in depth at this point. 12/28/2021: The thigh wound is closed again. The foot wound has some callus that subsequently has peeled back exposing just a small slit of a wound. The lateral leg wound Is down to about half the size that it originally was and the tunnel is down to about half a centimeter in depth. 01/04/2022: The thigh wound remains closed. The foot wound has heavy callus overlying the wound site. Once this was debrided, the wound was found to be closed. The lateral leg wound is smaller again this week and very superficial. No tunnel could be identified. 01/12/2022: The thigh and foot wounds both remain closed. The lateral leg wound is now nearly flush with the skin surface. There is good granulation tissue present with a light layer of slough. 01/19/2022: Due to the way his wrap was placed, the  patient did not change the dressing on his thigh at all and so the foam was saturated and his skin is macerated. There is a light layer of slough on the wound surface. The underlying granulation tissue is robust and healthy-appearing. He has heavy callus buildup at the site of his first metatarsal head wound which is still healed. 02/01/2022: He has been in silver alginate. When he removed the dressing from his thigh wound, however, some leg, superficially reopening a portion of the wound that had healed. In addition, underneath the callus at his left first metatarsal head, there appears to be a blister and the wound appears to be open again. 02/08/2022: The lateral leg wound has contracted substantially. There is eschar and a light layer of slough present. He says that it is starting to pull and is uncomfortable. On inspection, there is some puckering of the scar and the eschar is quite dry; this may account for his symptoms. On his first metatarsal head, the wound is much smaller with just some eschar on the surface. The callus has not reaccumulated. He reports that he had a blister come up on his medial thigh wound at the distal aspect. It popped and there is now an opening in his skin again. Looking back through his library of wound photos, there is what looks like a permanent suture just deep to this location and it may be trying to erode through. We have been using silver alginate on his wounds. 02/15/2022: The lateral leg wound is about half the size it was last week. It is clean with just a little perimeter eschar and light slough. The wound on his first metatarsal head is about the same with heavy callus overlying it. The medial thigh wound is closed again. He does have some skin changes on the top of his foot that looks potentially yeast related. 02/22/2022: The skin on the top of his foot improved with the use of a topical antifungal. The lateral leg wound continues to contract and is again  smaller this week. There is a little bit of slough and eschar on the surface. The first metatarsal head wound is a little bit smaller but  has reaccumulated a thick callus over the top. He decided to try to trim his toenail and ultimately took the entire nail off of his left great toe. 03/02/2022: His lateral leg wound continues to improve, as does the wound on his left great toe. Unfortunately, it appears that somehow his foot got wet and moisture seeped in through the opening causing his skin to lift. There is a large wound now overlying his first metatarsal on both the plantar, medial, and dorsal portion of his foot. There is necrotic tissue and slough present underneath the shaggy macerated skin. 03/08/2022: The lateral leg wound is smaller again today. There is just a light layer of slough and eschar on the surface. The great toe wound is smaller again today. The first metatarsal wound is a little bit smaller today and does not look nearly as necrotic and macerated. There is still slough and nonviable tissue present. 03/15/2022: The lateral leg wound is narrower and just has a little bit of light slough buildup. The first metatarsal wound still has a fair amount of moisture affecting the periwound skin. The great toe wound is healed. 03/22/2022: The lateral leg wound is now isolated to just at the level of his knee. There is some eschar and slough accumulation. The first metatarsal head wound has epithelialized tremendously and is about half the size that it was last week. He still has some maceration on the top of his foot and a fungal odor is present. 03/29/2022: T oday the patient's foot was macerated, suggesting that the cast got wet. The patient has also been picking at his dry skin and has enlarged the wound on his left lateral leg. In the time between having his cast removed and my evaluation, he had picked more dry skin and opened up additional wounds on his Achilles area and dorsal foot. The  plantar first metatarsal head wound, however, is smaller and clean with just macerated callus around the perimeter and light slough on the surface. The lateral leg wound measured a little bit larger but is also fairly clean with eschar and minimal slough. 04/02/2022: The patient had vascular studies done last Friday and so his cast was not applied. He is here today to have that done. 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 04/05/2022: Both wounds are smaller and quite clean. There is just a little biofilm on the lateral leg wound. 10/20; the patient has a wound on the left lateral surgical incision at the level of his lateral knee this looks clean and improved. He is using silver alginate. He also has an area on his left medial foot for which she is using Hydrofera Blue under a total contact cast both wounds are measuring smaller 04/20/2022: The plantar foot wound has contracted considerably and is very close to closing. The lateral leg wound was measured a little larger, but there was a tiny open area that was included in the measurements that was not included last week. He has some eschar around the perimeter but otherwise the wound looks clean. [name], [name] ([id number]) [id number] Page 7 of 22 04/27/2022: The lateral leg wound looks better this week. He says that midweek, he felt it was very dry and began applying hydrogel to the site. I think this was beneficial. The foot wound is nearly closed underneath a thick layer of dry skin and callus. 05/04/2022: The foot wound is healed. He has developed a new small ulcer on his anterior tibial surface about midway up his  leg. It has a little slough on the surface. The lateral leg wound still is fairly dry, but clean with just a little biofilm on the surface. 05/11/2022: The wound on his foot reopened on Wednesday. A large blister formed which then broke open revealing the fat layer underneath. The ulcer on  his anterior tibial surface is a little bit larger this week. The lateral leg wound has much better moisture balance this week. Fortunately, prior to his foot wound reopening, he did get the cast made for his orthotic. 05/15/2022: Already, the left medial foot wound has improved. The tissue is less macerated and the surface is clean. The ulcer on his anterior tibial surface continues to enlarge. This seems likely secondary to accumulated moisture. The lateral leg wound continues to have an improved moisture balance with the use of collagen. 05/25/2022: The medial foot wound continues to contract. It is now substantially smaller with just a little slough on the surface. The anterior tibial surface wound continues to enlarge further. Once again, this seems to be secondary to moisture. The lateral leg wound does not seem to be changing much in size, but the moisture balance is better. 06/01/2022: The anterior tibial wound is closed. The medial foot wound is down to just a very small, couple of millimeters, opening. The lateral leg wound has good moisture balance, but remains unchanged in size. 12/15; the patient's anterior tibial wound has reopened, however the area on his right first metatarsal head is closed. The major wound is actually on the superior part of his surgical wound in the left lateral thigh. Not a completely viable surface under illumination. This may at some point require a debridement I think he is currently using Prisma. As noted the left medial foot wound has closed 06/14/2022: The anterior tibial wound has closed. The lateral leg wound has a better surface but is basically unchanged in size. The left medial foot wound has reopened. It looks as though there was some callus accumulation and moisture got under the callus which caused the tissue to break down again. 06/21/2022: A new wound has opened up just distal to the previous anterior tibial wound. It is small but has hypertrophic  granulation tissue present. The lateral leg wound is a little bit narrower and has a layer of slough on the surface. The left medial foot wound is down to just a pinhole. His custom orthotics should be available next week. 06/28/2022: The wound on his first metatarsal head has healed. He has developed a new small wound on his medial lower leg, in an old scar site. The lateral leg wound continues to contract but continues to accumulate slough, as well. 07/03/2022: Despite wearing his custom orthopedic shoes, he managed to reopen the wound on his first metatarsal head. He says he thinks his foot got wet and then some skin lifted up and he peeled this away. Both of the lower leg wounds are smaller and have some dry eschar on the surface. The lateral leg wound is quite a bit narrower today. 07/12/2022: The medial lower leg wound is closed. The anterior lower leg wound has contracted considerably. The lateral upper leg wound is narrower with a layer of slough on the surface. The first metatarsal head wound is also smaller, but had copious drainage which saturated the foam border dressing and resulted in some periwound tissue maceration. Fortunately there was no breakdown at this site. 07/19/2022: The lower leg shows signs of significant maceration; I think he must be sweating  excessively inside his cast. There are several areas of skin breakdown present. The wound on his foot is smaller and that on his lateral leg is narrower and is shorter by about a centimeter. 07/26/2022: Last week we used a zinc Coflex wrap prior to applying his total contact cast and this has had the effect of keeping his skin from getting macerated this week. The anterior leg wound has epithelialized substantially. The lateral leg wound is significantly smaller with just a bit of slough on the surface. The first metatarsal head wound is also smaller this week. 08/02/2022: The anterior leg wound was closed on arrival, but while he was sitting  in the room, he picked it open again. The lateral leg wound is smaller with just a little slough on the surface and the first metatarsal head wound has contracted further, as well. 08/09/2022: The first metatarsal head wound is covered with callus. Underneath the callus, it is nearly completely closed. The lateral leg wound is smaller again this week. The anterior leg wound looks better, but he has such heavy buildup of old skin, that moisture is getting underneath this, becoming trapped, and causing the underlying skin to get macerated and open up. 08/16/2022: The first metatarsal head wound is closed. The lateral leg wound continues to contract and is quite a bit smaller again this week. There is just a small, superficial opening remaining on his anterior tibial surface. 08/23/2022: The first metatarsal head wound has, by some miracle, remained closed. The lateral leg wound is substantially smaller with multiple areas of epithelialization. The anterior tibial surface wound is also quite a bit smaller and very clean. 08/30/2022: Unfortunately, his first metatarsal head wound opened up again. It happened in the same fashion as it has on prior occasions. Moisture got under dried skin/callus and created a wound when he removed his sock, taking the skin with it. The anterior tibial surface has a thick shell of hyperkeratotic skin. This has been contributing to ongoing repeat wounding events as moisture gets underneath this and causes tissue breakdown. 3/15; patient presents for follow-up. His anterior left leg wound has healed. He still has the wound to the left lateral aspect and left first met head. We have been using silver alginate and endoform to these areas under [employer]. He has no issues or complaints today. He has been taking Augmentin and reports improvement to his symptoms to the left first met head. 09/13/2022: He has accumulated more thick dry skin in sheets on his lower leg. The lateral leg  wound is about the same size and the left first metatarsal head wound is a little bit smaller. There is slough on both surfaces. There is callus buildup around the foot wound. 09/20/2022: The lateral leg wound is a little bit narrower and the left first metatarsal head wound also seems to have contracted slightly. There is slough on both surfaces. He has a little skin breakdown on his anterior tibial surface. 09/27/2022: The lateral leg wound continues to contract and is quite clean. The first metatarsal head wound is also smaller. There is some perimeter callus and slough accumulation on the foot. The anterior tibial surface is closed. 10/04/2022: Both of his wounds are smaller today, particularly the first metatarsal head wound. 10/18/2022: He missed his appointment last week and ended up cutting off his wrap on Saturday. The anterior tibial wound reopened. It is fairly superficial with a little bit of slough on the surface. His lateral leg wound is smaller with some  slough and eschar buildup. The first metatarsal head wound is also smaller with some callus and slough accumulation. 10/25/2022: All wounds are smaller. There is slough and eschar on the lateral leg and slough and callus on the plantar foot wound. The anterior tibial wound is clean and flush with the surrounding skin. No debris accumulation here. 11/01/2022: The wounds are all smaller again this week. There is slough on the lateral leg and some minimal slough and eschar on the anterior tibial wound. There is callus accumulation on the first metatarsal head site, along with slough. There is also a yeasty odor coming from the foot. [name], [name] ([id number]) [id number] Page 8 of 22 11/08/2022: The lateral leg wound is smaller except where he picked some skin while waiting to be seen. There is a little bit of slough on the surface. The anterior tibial wound is closed. The first metatarsal head site has gotten macerated  once again and has a lot of spongy wet tissue and callus around it. No yeast odor today. 11/15/2022: The lateral leg wound continues to contract. There is now a band of epithelium dividing it into 2 areas. Minimal slough accumulation. He managed to pick open a new wound on his medial lower leg. The first metatarsal head site is smaller with some callus accumulation but no tissue maceration. 11/22/2022: The lateral leg wound is smaller again by about a third today. There is a little bit of slough on the surface with some periwound eschar. The new wound that he picked open last week has healed but he picked open 3 new small wounds on his anterior tibial surface, once again due to his picking at his skin. The first metatarsal head wound looks about the same. There has been some callus accumulation and there is more edema present, as he was not put in a compression wrap last week. 11/29/2022: The lateral leg wound is smaller again today. There is a little periwound eschar and some slough present. The wounds on his anterior tibial surface are all closed except for 1 that has a little bit of slough on the open portion with eschar covering it. The first metatarsal head wound measured a little bit smaller today, but mostly looks about the same. Edema control is better. 12/06/2022: The anterior tibial wound is closed. The lateral leg wound is smaller with some slough and eschar accumulation. The plantar foot ulcer is about the same size, but the tissue does show some evidence of the fact that he was on his feet quite a bit more this past week; there was a death in his family. There has been more callus accumulation and the tissues are little bit more purpleish. 12/13/2022: He picked the skin on his anterior tibia and reopened a wound there while he was waiting to be seen in clinic. The lateral leg wound is much smaller with a little bit of slough and eschar. The plantar foot ulcer is smaller today with callus and  slough buildup, but without the pressure induced tissue injury that was seen last week. 12/20/2022: The anterior tibial wound has healed. The lateral leg wound is smaller again this week. There is a little bit of eschar buildup on the surface. The foot had a fairly strong odor coming from it that persisted even after washing. The wound itself looks like its gotten a little smaller but he has built up thick callus, once again. 12/26/2022: The lateral leg wound is down to just a narrow superficial slit. There is slough  and a little bit of dry skin present. The foot is in much better shape today. There is less callus accumulation and no odor. The skin edges are starting to roll inward, however. 01/03/2023: The lateral leg wound is healed. The skin is quite dry but the wound has closed. The foot continues to contract. He has a fair amount of periwound callus accumulation and the surface of the is a little drier than ideal. 01/10/2023: The large lateral leg wound remains closed. He managed to pick off some of his dry skin and has multiple small open superficial wounds on his lower leg. The foot wound measures smaller, but he has a blister immediately adjacent to it. 01/17/2023: The large lateral leg wound remains closed. The small superficial wounds on his lower leg have also closed. The foot wound is about the same and the blister area immediately adjacent to it has dark eschar on the surface. The foot wound looks a bit dry. 01/24/2023: He picked at some dry skin on his lateral leg wound and reopened. The surface is dry and fibrotic. The foot wound is slightly smaller but has periwound callus and the edges are rolling inward again. 01/31/2023: His lateral leg wound is larger again today. It appears that the Ace bandage may be rubbing and causing some tissue breakdown. His foot wound is also a bit larger and the tissue does not appear particularly healthy. Electronic Signature(s) Signed: 02/01/2023 7:53:40 AM By:  [name] Guess MD FACS Entered By: [name] on 02/01/2023 07:53:39 -------------------------------------------------------------------------------- Physical Exam Details Patient Name: Date of Service: [name]. 01/31/2023 10:00 A M Medical Record Number: [id number] Patient Account Number: [id number] Date of Birth/Sex: Treating RN: December 13, 1950 (72 y.o. M) Primary Care Provider: Ralene [name] Other Clinician: Referring Provider: Treating Provider/Extender: [name] in Treatment: 74 Constitutional Hypertensive, asymptomatic. . . . no acute distress. Respiratory Normal work of breathing on room air. Notes 01/31/2023: His lateral leg wound is larger again today. It appears that the Ace bandage may be rubbing and causing some tissue breakdown. His foot wound is also a bit larger and the tissue does not appear particularly healthy. Electronic Signature(s) Signed: 02/01/2023 7:54:14 AM By: [name] Guess MD FACS Entered By: [name] on 02/01/2023 07:54:14 [name] ([id number]) [id number] Page 9 of 22 -------------------------------------------------------------------------------- Physician Orders Details Patient Name: Date of Service: ZOHAN, EMERT 01/31/2023 10:00 A M Medical Record Number: [id number] Patient Account Number: [id number] Date of Birth/Sex: Treating RN: 1951-04-06 (72 y.o. 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 - Dr [name] - Room 2 Return A Anesthetic (In clinic) Topical Lidocaine 5% applied to wound bed Bathing/ Shower/ Hygiene May shower with protection but do not get wound dressing(s) wet. Protect dressing(s) with water repellant cover (for example, large plastic bag) or a cast cover and may then take shower. Edema Control - Lymphedema / SCD / Other Elevate legs to the level of the heart or above for 30 minutes daily  and/or when sitting for 3-4 times a day throughout the day. Avoid standing for long periods of time. Patient to wear own compression stockings every day. - both legs daily Moisturize legs daily. Compression stocking or Garment 20-30 mm/Hg pressure to: Off-Loading Wound #18R Left,Plantar Metatarsal head first Open toe surgical shoe to: - Front off loader Left ft Other: - minimal weight bearing left foot Additional Orders / Instructions Follow Nutritious Diet - vitamin C 500 mg 3 times a day and zinc 30-50 mg a day Wound Treatment Wound #18R - Metatarsal head first Wound Laterality: Plantar, Left Cleanser: Soap and Water 1 x Per Week/30 Days Discharge Instructions: May shower and wash wound with dial antibacterial soap and water prior to dressing change. Cleanser: Wound Cleanser 1 x Per Week/30 Days Discharge Instructions: Cleanse the wound with wound cleanser prior to applying a clean dressing using gauze sponges, not tissue or cotton balls. Peri-Wound Care: Ketoconazole Cream 2% 1 x Per Week/30 Days Discharge Instructions: Apply Ketoconazole as directed Peri-Wound Care: Triamcinolone 15 (g) 1 x Per Week/30 Days Discharge Instructions: Use triamcinolone 15 (g) as directed Topical: Gentamicin 1 x Per Week/30 Days Discharge Instructions: As directed by physician Topical: Mupirocin  Ointment 1 x Per Week/30 Days Discharge Instructions: Apply Mupirocin (Bactroban) as instructed Prim Dressing: Maxorb Extra Ag+ Alginate Dressing, 2x2 (in/in) ary 1 x Per Week/30 Days ZYAH, WESTHOFF ([id number]) [id number] Page 10 of 22 Discharge Instructions: Apply to wound bed as instructed Secondary Dressing: Optifoam Non-Adhesive Dressing, 4x4 in (Generic) 1 x Per Week/30 Days Discharge Instructions: Apply over primary dressing as directed. Secondary Dressing: Woven Gauze Sponges 2x2 in (Generic) 1 x Per Week/30 Days Discharge Instructions: Apply over primary dressing as directed. Secondary Dressing: Zetuvit Absorbent Pad, 4x4 (in/in) 1 x Per Week/30 Days Compression Wrap: Urgo K2, (equivalent to a 4 layer) two layer compression system, regular 1 x Per Week/30 Days Discharge Instructions: Apply Urgo K2 as directed (alternative to 4 layer compression). Wound #22R - Lower Leg Wound Laterality: Left, Lateral, Proximal Cleanser: Soap and Water 1 x Per Day/30 Days Discharge Instructions: May shower and wash wound with dial antibacterial soap and water prior to dressing change. Cleanser: Wound Cleanser 1 x Per Day/30 Days Discharge Instructions: Cleanse the wound with wound cleanser prior to applying a clean dressing using gauze sponges, not tissue or cotton balls. Prim Dressing: Endoform 2x2 in 1 x Per Day/30 Days ary Discharge Instructions: Moisten with saline Secondary Dressing: Woven Gauze Sponge, Non-Sterile 4x4 in 1 x Per Day/30 Days Discharge Instructions: Apply over primary dressing as directed. Secured With: Elastic Bandage 4 inch (ACE bandage) 1 x Per Day/30 Days Discharge Instructions: Secure with ACE bandage as directed. Secured With: [employer], 4.5x3.1 (in/yd) 1 x Per Day/30 Days Discharge Instructions: Secure with Kerlix as directed. Laboratory naerobe culture (MICRO) - PCR of nonhealing wound to left foot Bacteria identified in  Unspecified specimen by A LOINC Code: 635-3 Convenience Name: Anaerobic culture Patient Medications llergies: No Known Drug Allergies A Notifications Medication Indication Start End 01/31/2023 lidocaine DOSE topical 5 % ointment - ointment topical Electronic Signature(s) Signed: 02/01/2023 9:04:34 AM By: [name] Guess MD FACS Previous Signature: 01/31/2023 11:59:38 AM Version By: [name] Entered By: [name] on 02/01/2023 07:54:39 -------------------------------------------------------------------------------- Problem List Details Patient Name: Date of Service: [name]. 01/31/2023 10:00 A M Medical Record Number: [id number] Patient Account Number: [id number] Date of Birth/Sex: Treating RN: 1951-03-15 (72 y.o. M) Primary Care Provider: Ralene [name] Other Clinician: Referring Provider: Treating  Provider/Extender: [name] in Treatment: [address] 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 01/24/2023 No Yes I87.322 Chronic venous hypertension (idiopathic) with inflammation of left lower 04/12/2021 No Yes extremity E11.51 Type 2 diabetes mellitus with diabetic peripheral angiopathy without gangrene 04/12/2021 No Yes I89.0 Lymphedema, not elsewhere classified 04/12/2021 No Yes L85.9 Epidermal thickening, unspecified 08/30/2022 No Yes Inactive Problems ICD-10 Code Description Active Date Inactive Date E11.621 Type 2 diabetes mellitus with foot ulcer 04/12/2021 04/12/2021 E11.42 Type 2 diabetes mellitus with diabetic polyneuropathy 04/12/2021 04/12/2021 L02.416 Cutaneous abscess of left lower limb 06/13/2021 06/13/2021 Resolved  Problems ICD-10 Code Description Active Date Resolved Date L97.828 Non-pressure chronic ulcer of other part of left lower leg with other specified severity 04/12/2021 04/12/2021 W43.154 Non-pressure chronic ulcer of other part of left lower leg with other specified severity 01/10/2023 06/08/2022 Electronic Signature(s) Signed: 01/31/2023 10:48:13 AM By: [name] Guess MD FACS Entered By: [name] on 01/31/2023 10:48:13 -------------------------------------------------------------------------------- Progress Note Details Patient Name: Date of Service: [name]. 01/31/2023 10:00 A M Medical Record Number: [id number] Patient Account Number: [id number] Date of Birth/Sex: Treating RN: 06-09-1951 (72 y.o. M) Primary Care Provider: Ralene [name] Other Clinician: Referring Provider: Treating Provider/Extender: [name] in Treatment: [address] [place] ([id number]) [id number] Page 12 of 22 Subjective Chief Complaint Information obtained from Patient Left leg and foot ulcers 04/12/2021; patient is here for wounds on his left lower leg and left plantar foot over the first metatarsal head History of Present Illness (HPI) 10/11/17; Mr. Bustillos is a 72 year old man who tells me that in 2015 he slipped down the latter traumatizing his left leg. He developed a wound in the same spot the area that we are currently looking at. He states this closed over for the most part although he always felt it was somewhat unstable. In 2016 he hit the same area with the door of his car had this reopened. He tells me that this is never really closed although sometimes an inflow it remains open on a constant basis. He has not been using any specific dressing to this except for topical antibiotics the nature of which were not really sure. His primary doctor did send him to see Dr. Jacinto [name] of interventional cardiology. He underwent an angiogram on 08/06/17 and he  underwent a PTA and directional atherectomy of the lesser distal SFA and popliteal arteries which resulted in brisk improvement in blood flow. It was noted that he had 2 vessel runoff through the anterior tibial and peroneal. He is also been to see vascular and interventional radiologist. He was not felt to have any significant superficial venous insufficiency. Presumably is not a candidate for any ablation. It was suggested he come here for wound care. The patient is a type II diabetic on insulin. He also has a history of venous insufficiency. ABIs on the left were noncompressible in our clinic 10/21/17; patient we admitted to the clinic last week. 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 We put Iodosorb on him after an aggressive debridement and 3 layer compression. He complained of pain in his ankle and itching with is skin in fact he scratched the area on the medial calf  superiorly at the rim of our wraps and he has 2 small open areas in that location today which are new. I changed his primary dressing today to silver collagen. As noted he is already had revascularization and does not have any significant superficial venous insufficiency that would be amenable to ablation 10/28/17; patient admitted to the clinic 2 weeks ago. He has a smaller Wound. Scratch injury from last week revealed. There is large wound over the tibial area. This is smaller. Granulation looks healthy. No need for debridement. 11/04/17; the wound on the left lateral calf looks better. Improved dimensions. Surface of this looks better. We've been maintaining him and Kerlix Coban wraps. He finds this much more comfortable. Silver collagen dressing 11/11/17; left lateral Wound continues to look healthy be making progress. Using a #5 curet I removed removed nonviable skin from the surface of the wound and then necrotic debris from the wound surface. Surface of the  wound continues to look healthy. He also has an open area on the left great toenail bed. We've been using topical antibiotics. 11/19/17; left anterior lateral wound continues to look healthy but it's not closed. He also had a small wound above this on the left leg Initially traumatic wounds in the setting of significant chronic venous insufficiency and stasis dermatitis 11/25/17; left anterior wounds superiorly is closed still a small wound inferiorly. 12/02/17; left anterior tibial area. Arrives today with adherent callus. Post debridement clearly not completely closed. Hydrofera Blue under 3 layer compression. 12/09/17; left anterior tibia. Circumferential eschar however the wound bed looks stable to improved. We've been using Hydrofera Blue under 3 layer compression 12/17/17; left anterior tibia. Apparently this was felt to be closed however when the wrap was taken off there is a skin tear to reopen wounds in the same area we've been using Hydrofera Blue under 3 layer compression 12/23/17 left anterior tibia. Not close to close this week apparently the [facility] was stuck to this again. Still circumferential eschar requiring debridement. I put a contact layer on this this time under the Hydrofera Blue 12/31/17; left anterior tibia. Wound is better slight amount of hyper-granulation. Using Hydrofera Blue over Adaptic. 01/07/18; left anterior tibia. The wound had some surface eschar however after this was removed he has no open wound.he was already revascularized by Dr. Jacinto [name] when he came to our clinic with atherectomy of the left SFA and popliteal artery. He was also sent to interventional radiology for venous reflux studies. He was not felt to have significant reflux but certainly has chronic venous changes of his skin with hemosiderin deposition around this area. He will definitely need to lubricate his skin and wear compression stocking and I've talked to him about this. READMISSION 05/26/2018 This  is a now 72 year old man we cared for with traumatic wounds on his left anterior lower extremity. He had been previously revascularized during that admission by Dr. Jacinto [name]. Apparently in follow-up Dr. Jacinto [name] noted that he had deterioration in his arterial status. He underwent a stent placement in the distal left SFA on 04/22/2018. 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 He went back to the angiography suite on 04/30/2018 he underwent PTA and balloon angioplasty of the occluded left mid anterior tibial artery, thrombotic occlusion went from 100 to 0% which reconstitutes the posterior tibial artery. He had thrombectomy and aspiration of the peroneal artery. The stent placed in the distal SFA left SFA was still occluded. He was discharged on Xarelto, it was noted on the discharge summary  from this hospitalization that he had gangrene at the tip of his left fifth toe and there were expectations this would auto amputate. 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 He has been recuperating at [facility] home in [facility] after the most recent hospitalization. He is going home tomorrow. He tells me that 2 weeks ago he traumatized the tip of his left fifth toe. He came in urgently for our review of this. This was a history of before I noted that Dr. Jacinto [name] had already noted dry gangrenous changes of the left fifth toe 06/09/2018; 2-week follow-up. I did contact Dr. Jacinto [name] after his last appointment and he apparently saw 1 of Dr. Verl Dicker colleagues the next day. He does not follow-up with Dr. Jacinto [name] himself until Thursday of this week. 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 Nevertheless there is no evidence of infection no drainage and no pain. He had a new area that this week when we were signing him in today on the left anterior mid tibia area, this is in close proximity to the previous wound we have dealt with in this  clinic. 06/23/2018; 2-week follow-up. I did not receive a recent note from Dr. Jacinto [name] to review today. Our office is trying to obtain this. He is apparently not planning to do further vascular interventions and wondered about compression to try and help with the patient's chronic venous insufficiency. However we are also concerned about the arterial flow. He arrives in clinic today with a new area on the left third toe. The areas on the calf/anterior tibia are close to closing. The left fifth toe is still mummified using Betadine. -In reviewing things with the patient he has what sounds like claudication with mild to moderate amount of activity. 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 I prescribed Levaquin over the phone while we attempted to arrange a plan of care. However the patient called yesterday to report he had low-grade fever and he came in today acutely. There is been a marked deterioration in the left third toe with spreading cellulitis up into the dorsal left foot. He was referred to the emergency room. Readmission: 06/29/2020 patient presents today for reevaluation here in our clinic he was previously treated by Dr. Leanord Hawking at the latter part of 2019 in 2 the beginning of 2020. Subsequently we have not seen him since that time in the interim he did have evaluation with vein and vascular specialist specifically Dr. Bo Mcclintock who did perform quite extensive work for a left femoral to anterior tibial artery bypass. With that being said in the interim the patient has developed significant lymphedema and has wounds that he tells me have really never healed in regard to the incision site on the left leg. He also has multiple wounds on the feet for various reasons some of which is that he tends to pick at his feet. Fortunately there is no signs of active infection systemically at this time he does have some wounds that are little bit deeper but most are fairly  superficial he seems to have good blood flow and overall everything appears to be healthy I see no bone exposed and no obvious signs of osteomyelitis. I do not know that he necessarily needs a x-ray at this point although that something we could consider depending on how things progress. The patient does have a history of  lymphedema, diabetes, this is type II, chronic kidney disease stage III, hypertension, and history of peripheral vascular disease. JEVANTE, MCCAUSLIN ([id number]) [id number] Page 13 of 22 07/05/2020; patient admitted last week. Is a patient I remember from 2019 he had a spreading infection involving the left foot and we sent him to the hospital. 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 He subsequently had a left femoral to anterior tibial bypass by [facility]. He also has severe lymphedema with chronic skin changes related to that on the left leg. The most problematic area that was new today was on the left medial great toe. This was apparently a small area last week there was purulent drainage which our intake nurse cultured. Also areas on the left medial foot and heel left lateral foot. He has 2 areas on the left medial calf left lateral calf in the setting of the severe lymphedema. 07/13/2020 on evaluation today patient appears to be doing better in my opinion compared to his last visit. The good news is there is no signs of active infection systemically and locally I do not see any signs of infection either. He did have an x-ray which was negative that is great news he had a culture which showed MRSA but at the same time he is been on the doxycycline which has helped. I do think we may want to extend this for 7 additional days 1/25; patient admitted to the clinic a few weeks ago. 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 We have been putting him under  compression his edema control is a lot better but he is severe verricused skin on the left leg. He is really done quite well he still has an open area on the left medial calf and the left medial first metatarsal head. We have been using silver collagen on the leg silver alginate on the foot 07/27/2020 upon evaluation today patient appears to be doing decently well in regard to his wounds. He still has a lot of dry skin on the left leg. Some of this is starting to peel back and I think he may be able to have them out by removing some that today. Fortunately there is no signs of active infection at this time on the left leg although on the right leg he does appear to have swelling and erythema as well as some mild warmth to touch. This does have been concerned about the possibility of cellulitis although within the differential diagnosis I do think that potentially a DVT has to be at least considered. We need to rule that out before proceeding would just call in the cellulitis. Especially since he is having pain in the posterior aspect of his calf muscle. 2/8; the patient had seen sparingly. He has severe skin changes of chronic lymphedema in the left leg thickened hyperkeratotic verrucous skin. He has an open wound on the medial part of the left first met head left mid tibia. He also has a rim of nonepithelialized skin in the anterior mid tibia. He brought in the AmLactin lotion that was been prescribed although I am not sure under compression and its utility. There concern about cellulitis on the right lower leg the last time he was here. He was put on on antibiotics. His DVT rule out was negative. The right leg looks fine he is using his stocking on this area 08/10/2020 upon evaluation today  patient appears to be doing well with regard to his leg currently. He has been tolerating the dressing changes without complication. Fortunately there is no signs of active infection which is great news. Overall very  pleased with where things stand. 2/22; the patient still has an area on the medial part of the left first met his head. This looks better than when I last saw this earlier this month he has a rim of epithelialization but still some surface debris. Mostly everything on the left leg is healed. There is still a vulnerable in the left mid tibia area. 08/30/2020 upon evaluation today patient appears to be doing much better in regard to his wounds on his foot. 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 Nonetheless I think we will address that today I Minna send in a prescription for him in that regard. Overall though there does not appear to be any signs of significant worsening. 09/07/2020 on evaluation today patient's wounds over his left foot appear to be doing excellent. I do not see any signs of infection there is some callus buildup this can require debridement for certain but overall I feel like he is managing quite nicely. He still using the AmLactin cream which has been beneficial for him as well. 3/22; left foot wound is closed. There is no open area here. He is using ammonium lactate lotion to the lower extremities to help exfoliate dry cracked skin. He has compression stockings from [employer] [facility] in [place]. The wound on the medial part of his left first met head is healed today. READMISSION 04/12/2021 Mr. Lashway is a patient we know fairly well he had a prolonged stay in clinic in 2019 with wounds on his left lateral and left anterior lower extremity in the setting of chronic venous insufficiency. More recently he was here earlier this year with predominantly an area on his left foot first metatarsal head plantar and he says the plantar foot broke down on its not long after we discharged him but he did not come back here. The last few months areas of broken  down on his left anterior and again the left lateral lower extremity. The leg itself is very swollen chronically enlarged a lot of hyperkeratotic dry Berry Q skin in the left lower leg. His edema extends well into the thigh. He was seen by Dr. Randie Heinz. He had ABIs on 03/02/2021 showing an ABI on the right of 1 with a TBI of 0.72 his ABI in the left at 1.09 TBI of 0.99. Monophasic and biphasic waveforms on the right. On the left monophasic waveforms were noted he went on to have an angiogram on 03/27/2021 this showed the aortic aortic and iliac segments were free of flow-limiting stenosis the left common femoral vein to evaluate the left femoral to anterior tibial artery bypass was unobstructed the bypass was patent without any areas of stenosis. We discharged the patient in bilateral juxta lite stockings but very clearly that was not sufficient to control the swelling and maintain skin integrity. He is clearly going to need compression pumps. 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 This is fortunate because he is on his feet for long periods of time. 10/27; patient comes in with our intake nurse reporting copious amount of green drainage  from the left anterior mid tibia the left dorsal foot and to a lesser extent the left medial mid tibia. We left the compression wrap on all week for the amount of edema in his left leg is quite a bit better. We use silver alginate as the primary dressing 11/3; edema control is good. Left anterior lower leg left medial lower leg and the plantar first metatarsal head. The left anterior lower leg required debridement. Deep tissue culture I did of this wound showed MRSA I put him on 10 days of doxycycline which she will start today. We have him in compression wraps. He has a security card and [employer] however he is retiring on November 15. We will need to then get him into a better offloading boot for the left foot perhaps a  total contact cast 11/10; edema control is quite good. Left anterior and left medial lower leg wounds in the setting of chronic venous insufficiency and lymphedema. He also has a substantial area over the left plantar first metatarsal head. 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 He has significant hypergranulation on the left plantar foot wound. The patient is a diabetic but he does not have significant PAD 11/17; edema control is quite good. Left anterior and left medial lower leg wounds look better. The really concerning area remains the area on the left plantar first metatarsal head. He has a rim of epithelialization. 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 Starting today with a forefoot off loader but . possibly a total contact cast. 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 Arrives with a very significant odor from I think all of the wounds on the left leg and the left foot. Because of this I did not put a total contact cast on him today but will could still consider this. His wife was having cataract surgery which is the reason he missed the appointment 12/6. I saw this man 5 days ago with a swelling below the popliteal fossa. I thought he actually might have a Baker's cyst however the DVT rule out study that we could arrange right away was negative the technician told me this was not a ruptured Baker's cyst. We attempted to get this aspirated by under ultrasound guidance in interventional radiology however all they did was an ultrasound however it shows an extensive fluid collection 62 x 8 x 9.4 in the left thigh and left calf. The patient states he thinks this started 8 days  ago or so but he really is not complaining of any pain, fever or systemic symptoms. He has not ha 12/20; after some difficulty I managed to get the patient into see Dr. Randie Heinz. Eventually he was taken into the hospital and had a drain put in the fluid collection below his left knee posteriorly extending into the posterior thigh. He still has the drain in place. Culture of this showed moderate staff aureus few Morganella and few Klebsiella he is now on doxycycline and ciprofloxacin as suggested by infectious disease he is on this for a month. The drain will remain in place until it stops draining [name], [name] ([id number]) [id number].pdf Page  14 of 22 12/29; he comes in today with the 1 wound on his left leg and the area on the left plantar first met head significantly smaller. Both look healthy. He still has the drain in the left leg. He says he has to change this daily. Follows up with Dr. Randie Heinz on January 11. 06/29/2021; the wounds that I am following on the left leg and left first met head continued to be quite healthy. However the area where his inferior drain is in place had copious amounts of drainage which was green in color. The wound here is larger. Follows up with Dr. Pascal Lux of vein and vascular his surgeon next week as well as infectious disease. He remains on ciprofloxacin and doxycycline. He is not complaining of excessive pain in either one of the drain areas 1/12; the patient saw vascular surgery and infectious disease. Vascular surgery has left the drain in place as there was still some notable drainage still see him back in 2 weeks. Dr. Dorthula Perfect stop the doxycycline and ciprofloxacin and I do not believe he follows up with them at this point. Culture I did last week showed both doxycycline resistant MRSA and Pseudomonas not sensitive to ciprofloxacin although only in rare titers 1/19; the patient's wound on the left anterior lower leg is just about healed. We have  continued healing of the area that was medially on the left leg. Left first plantar metatarsal head continues to get smaller. The major problem here is his 2 drain sites 1 on the left upper calf and lateral thigh. There is purulent drainage still from the left lateral thigh. I gave him antibiotics last week but we still have recultured. He has the drain in the area I think this is eventually going to have to come out. I suspect there will be a connecting wound to heal here perhaps with improved VAc 1/26; the patient had his drain removed by vein and vascular on 1/25/. This was a large pocket of fluid in his left thigh that seem to tunnel into his left upper calf. 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 His mention his Penrose drain was removed today. He now has a tunneling wound on his left calf and left thigh. Both of these probe widely towards each other although I cannot really prove that they connect. Both wounds on his lower leg anteriorly are closed and his area over the first metatarsal head on his right foot continues to improve. We are using Hydrofera Blue here. He also saw infectious disease culture of the abscess they noted was polymicrobial with MRSA, Morganella and Klebsiella he was treated with doxycycline and ciprofloxacin for 4 weeks ending on 07/03/2021. They did not recommend any further antibiotics. Notable that while he still had the Penrose drain in place last week he had purulent drainage coming out of the inferior IandD site this grew [facility], MRSA and Pseudomonas but there does not appear to be any active infection in this area today with the drain out and he is not systemically unwell 2/2; with regards to the drain sites the superior one on the thigh actually is closed down the one on the upper left lateral calf measures about 8 and half centimeters which is an improvement seems to be less prominent although still with a lot of drainage. The only  remaining wound is over the first metatarsal head on the left foot and this looks to be continuing to improve with Hydrofera Blue. 2/9;  the area on his plantar left foot continues to contract. Callus around the wound edge. The drain sites specifically have not come down in depth. We put the wound VAC on Monday he changed the canister late last night our intake nurse reported a pocket of fluid perhaps caused by our compression wraps 2/16; continued improvement in left foot plantar wound. drainage site in the calf is not improved in terms of depth (wound vac) 2/23; continued improvement in the left foot wound over the first metatarsal head. With regards to the drain sites the area on his thigh laterally is healed however the open area on his calf is small in terms of circumference by still probes in by about 15 cm. Within using the wound VAC. Hydrofera Blue on his foot 08/24/2021: The left first metatarsal head wound continues to improve. The wound bed is healthy with just some surrounding callus. Unfortunately the open drain site on his calf remains open and tunnels at least 15 cm (the extent of a Q-tip). This is despite several weeks of wound VAC treatment. Based on reading back through the notes, there has been really no significant change in the depth of the wound, although the orifice is smaller and the more cranial wound on his thigh has closed. I suspect the tunnel tracks nearly all the way to this location. 08/31/2021: Continued improvement in the left first metatarsal head wound. There has been absolutely no improvement to the long tunnel from his open drain site on his calf. We have tried to get him into see vascular surgery sooner to consider the possibility of simply filleting the tract open and allowing it to heal from the bottom up, likely with a wound VAC. They have not yet scheduled a sooner appointment than his current mid April 09/14/2021: He was seen by vascular surgery and they took him  to the operating room last week. They opened a portion of the tunnel, but did not extend the entire length of the known open subcutaneous tract. I read Dr. Darcella Cheshire operative note and it is not clear from that documentation why only a portion of the tract was opened. The heaped up granulation tissue was curetted and removed from at least some portion of the tract. They did place a wound VAC and applied an Unna boot to the leg. The ulcer on his left first metatarsal head is smaller today. The bed looks good and there is just a small amount of surrounding callus. 09/21/2021: The ulcer on his left first metatarsal head looks to be stalled. There is some callus surrounding the wound but the wound bed itself does not appear particularly dynamic. The tunnel tract on his lateral left leg seems to be roughly the same length or perhaps slightly smaller but the wound bed appears healthy with good granulation tissue. He opened up a new wound on his medial thigh and the site of a prior surgical incision. He says that he did this unconsciously in his sleep by scratching. 09/28/2021: Unfortunately, the ulcer on his left first metatarsal head has extended underneath the callus toward the dorsum of his foot. The medial thigh wounds are roughly the same. The tunnel on his lateral left leg continues to be problematic; it is longer than we are able to actually probe with a Q-tip. I am still not certain as to why Dr. Randie Heinz did not open this up entirely when he took the patient to the operating room. We will likely be back in the same situation with  just a small superficial opening in a long unhealed tract, as the open portion is granulating in nicely. 10/02/2021: The patient was initially scheduled for a nurse visit, but we are also applying a total contact cast today. The plantar foot wound looks clean without significant accumulated callus. We have been applying Prisma silver collagen to the site. 10/05/2021: The patient is  here for his first total contact cast change. We have tried using gauze packing strips in the tunnel on his lateral leg wound, but this does not seem to be working any better than the white VAC foam. The foot ulcer looks about the same with minimal periwound callus. Medial thigh wound is clean with just some overlying eschar. 10/12/2021: The plantar foot wound is stable without any significant accumulation of periwound callus. The surface is viable with good granulation tissue. The medial thigh wounds are much smaller and are epithelializing. On the other hand, he had purulent drainage coming from the tunnel on his lateral leg. He does go back to see Dr. Randie Heinz next week and is planning to ask him why the wound tunnel was not completely opened at the time of his most recent operation. 10/19/2021: The plantar foot wound is markedly improved and has epithelial tissue coming through the surface. The medial thigh wounds are nearly closed with just a tiny open area. He did see Dr. Randie Heinz earlier this week and apparently they did discuss the possibility of opening the sinus tract further and enabling a wound VAC application. Apparently there are some limits as to what Dr. 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 I think if we could get the tract open to the level of the popliteal fossa, this would greatly aid in her ability to get this chart closed. That being said, however, today when I probed the tract with a Q-tip, I was not able to insert the entirety of the Q-tip as I have on previous occasions. The tunnel is shorter by about 4 cm. The surface is clean with good granulation tissue and no further episodes of purulent drainage. 10/30/2021: Last week, the patient underwent surgery and had the long tract in his leg opened. There was a rind that was debrided, according to the operative report. His medial thigh ulcers are closed. The plantar foot wound is clean with a good  surface and some built up surrounding callus. 11/06/2021: The overall dimensions of the large wound on his lateral leg remain about the same, but there is good granulation tissue present and the tunneling is a little bit shorter. He has a new wound on his anterior tibial surface, in the same location where he had a similar lesion in the past. The plantar foot wound is clean with some buildup surrounding callus. Just toward the medial aspect of his foot, however, there is an area of darkening that once debrided, revealed another opening in the skin surface. [name], BENNE ([id number]) [id number] Page 15 of 22 11/13/2021: The anterior tibial surface wound is closed. The plantar foot wound has some surrounding callus buildup. The area of darkening that I debrided last week and revealed an opening in the skin surface has closed again. The tunnel in the large wound on his lateral leg has come in by about 3 cm. There is healthy granulation tissue on the entire wound surface. 11/23/2021: The patient was out of town last week and did wet-to-dry dressings on his large wound. He says that he rented an Armed forces logistics/support/administrative officer and was  able to avoid walking for much of his vacation. Unfortunately, he picked open the wound on his left medial thigh. He says that it was itching and he just could not stop scratching it until it was open again. The wound on his plantar foot is smaller and has not accumulated a tremendous amount of callus. The lateral leg wound is shallower and the tunnel has also decreased in depth. There is just a little bit of slough accumulation on the surface. 11/30/2021: Another portion of his left medial thigh has been opened up. All of these wounds are fairly superficial with just a little bit of slough and eschar accumulation. The wound on his plantar foot is almost closed with just a bit of eschar and periwound callus accumulation. The lateral leg wound is nearly  flush with the surrounding skin and the tunnel is markedly shallower. 12/07/2021: There is just 1 open area on his left medial thigh. It is clean with just a little bit of perimeter eschar. The wound on his plantar foot continues to contract and just has some eschar and periwound callus accumulation. The lateral leg wound is closing at the more distal aspect and the tunnel is smaller. The surface is nearly flush with the surrounding skin and it has a good bed of granulation tissue. 12/14/2021: The thigh and foot wounds are closed. The lateral leg wound has closed over approximately half of its length. The tunnel continues to contract and the surface is now flush with the surrounding skin. The wound bed has robust granulation tissue. 12/22/2021: The thigh and foot wounds have reopened. The foot wound has a lot of callus accumulation around and over it. The thigh wound is tiny with just a little bit of slough in the wound bed. The lateral leg wound continues to contract. His vascular surgeon took the wound VAC off earlier in the week and the patient has been doing wet-to-dry dressings. There is a little slough accumulation on the surface. The tunnel is about 3 cm in depth at this point. 12/28/2021: The thigh wound is closed again. The foot wound has some callus that subsequently has peeled back exposing just a small slit of a wound. The lateral leg wound Is down to about half the size that it originally was and the tunnel is down to about half a centimeter in depth. 01/04/2022: The thigh wound remains closed. The foot wound has heavy callus overlying the wound site. Once this was debrided, the wound was found to be closed. The lateral leg wound is smaller again this week and very superficial. No tunnel could be identified. 01/12/2022: The thigh and foot wounds both remain closed. The lateral leg wound is now nearly flush with the skin surface. There is good granulation tissue present with a light layer of  slough. 01/19/2022: Due to the way his wrap was placed, the patient did not change the dressing on his thigh at all and so the foam was saturated and his skin is macerated. There is a light layer of slough on the wound surface. The underlying granulation tissue is robust and healthy-appearing. He has heavy callus buildup at the site of his first metatarsal head wound which is still healed. 02/01/2022: He has been in silver alginate. When he removed the dressing from his thigh wound, however, some leg, superficially reopening a portion of the wound that had healed. In addition, underneath the callus at his left first metatarsal head, there appears to be a blister and the wound appears to  be open again. 02/08/2022: The lateral leg wound has contracted substantially. There is eschar and a light layer of slough present. He says that it is starting to pull and is uncomfortable. On inspection, there is some puckering of the scar and the eschar is quite dry; this may account for his symptoms. On his first metatarsal head,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

## 2023-02-06 ENCOUNTER — Other Ambulatory Visit: Payer: Self-pay

## 2023-02-06 DIAGNOSIS — E1165 Type 2 diabetes mellitus with hyperglycemia: Secondary | ICD-10-CM

## 2023-02-06 MED ORDER — OMNIPOD 5 DEXG7G6 PODS GEN 5 MISC: 1 refills | Status: DC

## 2023-02-07 ENCOUNTER — Encounter (HOSPITAL_BASED_OUTPATIENT_CLINIC_OR_DEPARTMENT_OTHER): Payer: Medicare Other | Admitting: General Surgery

## 2023-02-07 DIAGNOSIS — E11621 Type 2 diabetes mellitus with foot ulcer: Secondary | ICD-10-CM | POA: Diagnosis not present

## 2023-02-07 NOTE — Progress Notes (Signed)
[name], [name] ([id number]) [id number] Page 1 of 22 Visit Report for 02/07/2023 Chief Complaint Document Details Patient Name: Date of Service: [name], [name] 02/07/2023 10:00 A M Medical Record Number: [id number] Patient Account Number: [id number] Date of Birth/Sex: Treating RN: January 14, 1951 (72 y.o. M) Primary Care Provider: Ralene [name] Other Clinician: Referring Provider: Treating Provider/Extender: [name] in Treatment: 95 Information Obtained from: Patient Chief Complaint Left leg and foot ulcers 04/12/2021; patient is here for wounds on his left lower leg and left plantar foot over the first metatarsal head Electronic Signature(s) Signed: 02/07/2023 10:26:04 AM By: [name] MD FACS Entered By: [name] on 02/07/2023 10:26:04 -------------------------------------------------------------------------------- Debridement Details Patient Name: Date of Service: [name]. 02/07/2023 10:00 A M Medical Record Number: [id number] Patient Account Number: [id number] Date of Birth/Sex: Treating RN: [birth year] (72 y.o. [name] Primary Care Provider: Ralene [name] Other Clinician: Referring Provider: Treating Provider/Extender: [name] in Treatment: 95 Debridement Performed for Assessment: Wound #22R Left,Proximal,Lateral Lower Leg Performed By: Physician [name], MD Debridement Type: Debridement Level of Consciousness (Pre-procedure): Awake and Alert Pre-procedure Verification/Time Out Yes - 10:18 Taken: Start Time: 10:18 Pain Control: Lidocaine 5% topical ointment Percent of Wound Bed Debrided: 100% T Area Debrided (cm): otal 0.12 Tissue and other material debrided: Non-Viable, Eschar, Slough, Slough Level: Non-Viable Tissue Debridement Description: Selective/Open Wound Instrument: Curette Bleeding: Minimum Hemostasis Achieved: Pressure Response to  Treatment: Procedure was tolerated well Level of Consciousness (Post- Awake and Alert procedure): Post Debridement Measurements of Total Wound Length: (cm) 0.5 Width: (cm) 0.3 Depth: (cm) 0.1 Volume: (cm) 0.012 Character of Wound/Ulcer Post Debridement: Improved Post Procedure Diagnosis Same as [name] ([id number]) [id number] Page 2 of 22 Notes scribed for Dr. Lady Gary by [name] Bruin, RN Electronic Signature(s) Signed: 02/07/2023 10:31:46 AM By: [name] MD FACS Signed: 02/07/2023 3:56:49 PM By: [name] Entered By: [name] on 02/07/2023 10:20:34 -------------------------------------------------------------------------------- Debridement Details Patient Name: Date of Service: [name]. 02/07/2023 10:00 A M Medical Record Number: [id number] Patient Account Number: [id number] Date of Birth/Sex: Treating RN: [birth year] (72 y.o. [name] Primary Care Provider: Ralene [name] Other Clinician: Referring Provider: Treating Provider/Extender: [name] in Treatment: 95 Debridement Performed for Assessment: Wound #18R Left,Plantar Metatarsal head first Performed By: Physician [name], MD Debridement Type: Debridement Severity of Tissue Pre Debridement: Fat layer exposed Level of Consciousness (Pre-procedure): Awake and Alert Pre-procedure Verification/Time Out Yes - 10:18 Taken: Start Time: 10:18 Pain Control: Lidocaine 5% topical ointment Percent of Wound Bed Debrided: 100% T Area Debrided (cm): otal 2.4 Tissue and other material debrided: Non-Viable, Slough, Skin: Epidermis, Slough Level: Skin/Epidermis Debridement Description: Selective/Open Wound Instrument: Curette Bleeding: Minimum Hemostasis Achieved: Pressure Response to Treatment: Procedure was tolerated well Level of Consciousness (Post- Awake and Alert procedure): Post Debridement  Measurements of Total Wound Length: (cm) 1.7 Width: (cm) 1.8 Depth: (cm) 0.3 Volume: (cm) 0.721 Character of Wound/Ulcer Post Debridement: Improved Severity of Tissue Post Debridement: Fat layer exposed Post Procedure Diagnosis Same as Pre-procedure Notes scribed for Dr. Lady Gary by [name] Bruin, RN Electronic Signature(s) Signed: 02/07/2023 10:31:46 AM By: [name] MD FACS Signed: 02/07/2023 3:56:49 PM By: [name] Entered By: [name] on 02/07/2023 10:23:18 HPI Details -------------------------------------------------------------------------------- [name] ([id number]) [id number] Page 3 of 22 Patient Name: Date of Service: [name], [name] 02/07/2023 10:00 A M Medical Record Number: [id number] Patient Account Number: [id number] Date of Birth/Sex: Treating RN: January 17, 1951 (  72 y.o. M) Primary Care Provider: Ralene [name] Other Clinician: Referring Provider: Treating Provider/Extender: [name] in Treatment: 95 History of Present Illness HPI Description: 10/11/17; [name] is a 72 year old man who tells me that in 2015 he slipped down the latter traumatizing his left leg. He developed a wound in the same spot the area that we are currently looking at. He states this closed over for the most part although he always felt it was somewhat unstable. In 2016 he hit the same area with the door of his car had this reopened. He tells me that this is never really closed although sometimes an inflow it remains open on a constant basis. He has not been using any specific dressing to this except for topical antibiotics the nature of which were not really sure. His primary doctor did send him to see Dr. Jacinto Halim of interventional cardiology. He underwent an angiogram on 08/06/17 and he underwent a PTA and directional atherectomy of the lesser distal SFA and popliteal arteries which resulted in brisk improvement in  blood flow. It was noted that he had 2 vessel runoff through the anterior tibial and peroneal. He is also been to see vascular and interventional radiologist. He was not felt to have any significant superficial venous insufficiency. Presumably is not a candidate for any ablation. It was suggested he come here for wound care. The patient is a type II diabetic on insulin. He also has a history of venous insufficiency. ABIs on the left were noncompressible in our clinic 10/21/17; patient we admitted to the clinic last week. 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 We put Iodosorb on him after an aggressive debridement and 3 layer compression. He complained of pain in his ankle and itching with is skin in fact he scratched the area on the medial calf superiorly at the rim of our wraps and he has 2 small open areas in that location today which are new. I changed his primary dressing today to silver collagen. As noted he is already had revascularization and does not have any significant superficial venous insufficiency that would be amenable to ablation 10/28/17; patient admitted to the clinic 2 weeks ago. He has a smaller Wound. Scratch injury from last week revealed. There is large wound over the tibial area. This is smaller. Granulation looks healthy. No need for debridement. 11/04/17; the wound on the left lateral calf looks better. Improved dimensions. Surface of this looks better. We've been maintaining him and Kerlix Coban wraps. He finds this much more comfortable. Silver collagen dressing 11/11/17; left lateral Wound continues to look healthy be making progress. Using a #5 curet I removed removed nonviable skin from the surface of the wound and then necrotic debris from the wound surface. Surface of the wound continues to look healthy. He also has an open area on the left great toenail bed. We've been using topical  antibiotics. 11/19/17; left anterior lateral wound continues to look healthy but it's not closed. He also had a small wound above this on the left leg Initially traumatic wounds in the setting of significant chronic venous insufficiency and stasis dermatitis 11/25/17; left anterior wounds superiorly is closed still a small wound inferiorly. 12/02/17; left anterior tibial area. Arrives today with adherent callus. Post debridement clearly not completely closed. Hydrofera Blue under 3 layer compression. 12/09/17; left anterior tibia. Circumferential eschar however the wound bed looks stable to improved. We've been  using Hydrofera Blue under 3 layer compression 12/17/17; left anterior tibia. Apparently this was felt to be closed however when the wrap was taken off there is a skin tear to reopen wounds in the same area we've been using Hydrofera Blue under 3 layer compression 12/23/17 left anterior tibia. Not close to close this week apparently the [facility] was stuck to this again. Still circumferential eschar requiring debridement. I put a contact layer on this this time under the Hydrofera Blue 12/31/17; left anterior tibia. Wound is better slight amount of hyper-granulation. Using Hydrofera Blue over Adaptic. 01/07/18; left anterior tibia. The wound had some surface eschar however after this was removed he has no open wound.he was already revascularized by Dr. Jacinto Halim when he came to our clinic with atherectomy of the left SFA and popliteal artery. He was also sent to interventional radiology for venous reflux studies. He was not felt to have significant reflux but certainly has chronic venous changes of his skin with hemosiderin deposition around this area. He will definitely need to lubricate his skin and wear compression stocking and I've talked to him about this. READMISSION 05/26/2018 This is a now 72 year old man we cared for with traumatic wounds on his left anterior lower extremity. He had been  previously revascularized during that admission by Dr. Jacinto Halim. Apparently in follow-up Dr. Jacinto Halim noted that he had deterioration in his arterial status. He underwent a stent placement in the distal left SFA on 04/22/2018. 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 He went back to the angiography suite on 04/30/2018 he underwent PTA and balloon angioplasty of the occluded left mid anterior tibial artery, thrombotic occlusion went from 100 to 0% which reconstitutes the posterior tibial artery. He had thrombectomy and aspiration of the peroneal artery. The stent placed in the distal SFA left SFA was still occluded. 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 He has been recuperating at [facility] in [facility] after the most recent hospitalization. He is going home tomorrow. He tells me that 2 weeks ago he traumatized the tip of his left fifth toe. He came in urgently for our review of this. This was a history of before I noted that Dr. Jacinto Halim had already noted dry gangrenous changes of the left fifth toe 06/09/2018; 2-week follow-up. I did contact Dr. Jacinto Halim after his last appointment and he apparently saw 1 of Dr. Verl Dicker colleagues the next day. He does not follow-up with Dr. Jacinto Halim himself until Thursday of this week. 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 Nevertheless there is no evidence of infection no drainage and no pain. He had a new area that this week when we were signing him in today on the left anterior mid tibia area, this is in close proximity to the previous wound we have dealt with in this clinic. 06/23/2018; 2-week follow-up. I did not receive a recent note from Dr. Jacinto Halim to review today. Our  office is trying to obtain this. He is apparently not planning to do further vascular interventions and wondered about compression to try and help with the patient's chronic venous insufficiency. However we are also concerned about the arterial flow. He arrives in clinic today with a  new area on the left third toe. The areas on the calf/anterior tibia are close to closing. The left fifth toe is still mummified using Betadine. -In reviewing things with the patient he has what sounds like claudication with mild to moderate amount of activity. 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 I prescribed Levaquin over the phone while we attempted to arrange a plan of care. However the patient called yesterday to report he had low-grade fever and he came in today acutely. There is been a marked deterioration in the left third toe with spreading cellulitis up into the dorsal left foot. He was referred to the emergency room. Readmission: 06/29/2020 patient presents today for reevaluation here in our clinic he was previously treated by Dr. Leanord Hawking at the latter part of 2019 in 2 the beginning of 2020. Subsequently we have not seen him since that time in the interim he did have evaluation with vein and vascular specialist specifically Dr. Bo Mcclintock who did perform quite extensive work for a left femoral to anterior tibial artery bypass. With that being said in the interim the patient has developed significant lymphedema and has wounds that he tells me have really never healed in regard to the incision site on the left leg. He also has multiple wounds on the feet for various reasons some of which is that he tends to pick at his feet. Fortunately there is no signs of active infection systemically at this time he does have BRAEDON, [name] ([id number]) [id number] Page 4 of 22 some wounds that are little bit deeper but most are fairly superficial he seems to  have good blood flow and overall everything appears to be healthy I see no bone exposed and no obvious signs of osteomyelitis. I do not know that he necessarily needs a x-ray at this point although that something we could consider depending on how things progress. The patient does have a history of lymphedema, diabetes, this is type II, chronic kidney disease stage III, hypertension, and history of peripheral vascular disease. 07/05/2020; patient admitted last week. Is a patient I remember from 2019 he had a spreading infection involving the left foot and we sent him to the hospital. 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 He subsequently had a left femoral to anterior tibial bypass by Dr.Cain vein and vascular. He also has severe lymphedema with chronic skin changes related to that on the left leg. The most problematic area that was new today was on the left medial great toe. This was apparently a small area last week there was purulent drainage which our intake nurse cultured. Also areas on the left medial foot and heel left lateral foot. He has 2 areas on the left medial calf left lateral calf in the setting of the severe lymphedema. 07/13/2020 on evaluation today patient appears to be doing better in my opinion compared to his last visit. The good news is there is no signs of active infection systemically and locally I do not see any signs of infection either. He did have an x-ray which was negative that is great news he had a culture which showed MRSA but at the same time he is been on the doxycycline which has helped. I do think we may want to extend this for 7 additional days 1/25; patient admitted to the clinic a few weeks ago. 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 We have been putting him under compression his edema control is a lot better but he is severe verricused skin on the left leg. He is really  done quite well he still has an open area on the left medial calf and the left medial first metatarsal head. We have been using silver collagen on the leg silver alginate on the foot 07/27/2020 upon evaluation today patient appears to be doing decently well in regard to his wounds. He still has a lot of dry skin on the left leg. Some of this is starting to peel back and I think he may be able to have them out by removing some that today. Fortunately there is no signs of active infection at this time on the left leg although on the right leg he does appear to have swelling and erythema as well as some mild warmth to touch. This does have been concerned about the possibility of cellulitis although within the differential diagnosis I do think that potentially a DVT has to be at least considered. We need to rule that out before proceeding would just call in the cellulitis. Especially since he is having pain in the posterior aspect of his calf muscle. 2/8; the patient had seen sparingly. He has severe skin changes of chronic lymphedema in the left leg thickened hyperkeratotic verrucous skin. He has an open wound on the medial part of the left first met head left mid tibia. He also has a rim of nonepithelialized skin in the anterior mid tibia. He brought in the AmLactin lotion that was been prescribed although I am not sure under compression and its utility. There concern about cellulitis on the right lower leg the last time he was here. He was put on on antibiotics. His DVT rule out was negative. The right leg looks fine he is using his stocking on this area 08/10/2020 upon evaluation today patient appears to be doing well with regard to his leg currently. He has been tolerating the dressing changes without complication. Fortunately there is no signs of active infection which is great news. Overall very pleased with where things stand. 2/22; the patient still has an area on the medial part of the left first  met his head. This looks better than when I last saw this earlier this month he has a rim of epithelialization but still some surface debris. Mostly everything on the left leg is healed. There is still a vulnerable in the left mid tibia area. 08/30/2020 upon evaluation today patient appears to be doing much better in regard to his wounds on his foot. 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 Nonetheless I think we will address that today I Minna send in a prescription for him in that regard. Overall though there does not appear to be any signs of significant worsening. 09/07/2020 on evaluation today patient's wounds over his left foot appear to be doing excellent. I do not see any signs of infection there is some callus buildup this can require debridement for certain but overall I feel like he is managing quite nicely. He still using the AmLactin cream which has been beneficial for him as well. 3/22; left foot wound is closed. There is no open area here. He is using ammonium lactate lotion to the lower extremities to help exfoliate dry cracked skin. He has compression stockings  from elastic therapy in [place]. The wound on the medial part of his left first met head is healed today. READMISSION 04/12/2021 Mr. Poblano is a patient we know fairly well he had a prolonged stay in clinic in 2019 with wounds on his left lateral and left anterior lower extremity in the setting of chronic venous insufficiency. More recently he was here earlier this year with predominantly an area on his left foot first metatarsal head plantar and he says the plantar foot broke down on its not long after we discharged him but he did not come back here. The last few months areas of broken down on his left anterior and again the left lateral lower extremity. The leg itself is very swollen  chronically enlarged a lot of hyperkeratotic dry Berry Q skin in the left lower leg. His edema extends well into the thigh. He was seen by Dr. Randie Heinz. He had ABIs on 03/02/2021 showing an ABI on the right of 1 with a TBI of 0.72 his ABI in the left at 1.09 TBI of 0.99. Monophasic and biphasic waveforms on the right. On the left monophasic waveforms were noted he went on to have an angiogram on 03/27/2021 this showed the aortic aortic and iliac segments were free of flow-limiting stenosis the left common femoral vein to evaluate the left femoral to anterior tibial artery bypass was unobstructed the bypass was patent without any areas of stenosis. We discharged the patient in bilateral juxta lite stockings but very clearly that was not sufficient to control the swelling and maintain skin integrity. He is clearly going to need compression pumps. 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 This is fortunate because he is on his feet for long periods of time. 10/27; patient comes in with our intake nurse reporting copious amount of green drainage from the left anterior mid tibia the left dorsal foot and to a lesser extent the left medial mid tibia. We left the compression wrap on all week for the amount of edema in his left leg is quite a bit better. We use silver alginate as the primary dressing 11/3; edema control is good. Left anterior lower leg left medial lower leg and the plantar first metatarsal head. The left anterior lower leg required debridement. Deep tissue culture I did of this wound showed MRSA I put him on 10 days of doxycycline which she will start today. We have him in compression wraps. He has a security card and [employer] however he is retiring on November 15. We will need to then get him into a better offloading boot for the left foot perhaps a total contact cast 11/10; edema control is quite good. Left anterior and left medial lower leg  wounds in the setting of chronic venous insufficiency and lymphedema. He also has a substantial area over the left plantar first metatarsal head. 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 He has significant hypergranulation on the left plantar foot wound. The patient is a diabetic but he does not have significant PAD 11/17; edema control is quite good. Left anterior and left medial lower leg wounds look better. The really concerning area remains the area on the left plantar first metatarsal head. He has a rim of epithelialization. He has been using a surgical shoe The patient is now retired from a a  [employer] I have gone over with him the need to offload this area aggressively. Starting today with a forefoot off loader but . possibly a total contact cast. 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 Arrives with a very significant odor from I think all of the wounds on the left leg and the left foot. Because of this I did not put a total contact cast on him today but will could still consider this. His wife was having cataract surgery which is the reason he missed the appointment 12/6. I saw this man 5 days ago with a swelling below the popliteal fossa. I thought he actually might have a Baker's cyst however the DVT rule out study that we could arrange right away was negative the technician told me this was not a ruptured Baker's cyst. We attempted to get this aspirated by under ultrasound guidance in interventional radiology however all they did was an ultrasound however it shows an extensive fluid collection 62 x 8 x 9.4 in the left thigh and left calf. The patient states he thinks this started 8 days ago or so but he really is not complaining of any pain, fever or systemic symptoms. He has not  ha 12/20; after some difficulty I managed to get the patient into see Dr. Randie Heinz. Eventually he was taken into the hospital and had a drain put in the fluid collection below his left knee posteriorly extending into the posterior thigh. He still has the drain in place. Culture of this showed moderate staff aureus few Morganella [name], [name] ([id number]) [phone].pdf Page 5 of 22 and few Klebsiella he is now on doxycycline and ciprofloxacin as suggested by infectious disease he is on this for a month. The drain will remain in place until it stops draining 12/29; he comes in today with the 1 wound on his left leg and the area on the left plantar first met head significantly smaller. Both look healthy. He still has the drain in the left leg. He says he has to change this daily. Follows up with Dr. Randie Heinz on January 11. 06/29/2021; the wounds that I am following on the left leg and left first met head continued to be quite healthy. However the area where his inferior drain is in place had copious amounts of drainage which was green in color. The wound here is larger. Follows up with Dr. Pascal Lux of vein and vascular his surgeon next week as well as infectious disease. He remains on ciprofloxacin and doxycycline. He is not complaining of excessive pain in either one of the drain areas 1/12; the patient saw vascular surgery and infectious disease. Vascular surgery has left the drain in place as there was still some notable drainage still see him back in 2 weeks. Dr. Dorthula Perfect stop the doxycycline and ciprofloxacin and I do not believe he follows up with them at this point. Culture I did last week showed both doxycycline resistant MRSA and Pseudomonas not sensitive to ciprofloxacin although only in rare titers 1/19; the patient's wound on the left anterior lower leg is just about healed. We have continued healing of the area that was medially on the left leg. Left first plantar metatarsal head  continues to get smaller. The major problem here is his 2 drain sites 1 on the left upper calf and lateral thigh. There is purulent drainage still  from the left lateral thigh. I gave him antibiotics last week but we still have recultured. He has the drain in the area I think this is eventually going to have to come out. I suspect there will be a connecting wound to heal here perhaps with improved VAc 1/26; the patient had his drain removed by vein and vascular on 1/25/. This was a large pocket of fluid in his left thigh that seem to tunnel into his left upper calf. 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 His mention his Penrose drain was removed today. He now has a tunneling wound on his left calf and left thigh. Both of these probe widely towards each other although I cannot really prove that they connect. Both wounds on his lower leg anteriorly are closed and his area over the first metatarsal head on his right foot continues to improve. We are using Hydrofera Blue here. He also saw infectious disease culture of the abscess they noted was polymicrobial with MRSA, Morganella and Klebsiella he was treated with doxycycline and ciprofloxacin for 4 weeks ending on 07/03/2021. They did not recommend any further antibiotics. Notable that while he still had the Penrose drain in place last week he had purulent drainage coming out of the inferior IandD site this grew [place], MRSA and Pseudomonas but there does not appear to be any active infection in this area today with the drain out and he is not systemically unwell 2/2; with regards to the drain sites the superior one on the thigh actually is closed down the one on the upper left lateral calf measures about 8 and half centimeters which is an improvement seems to be less prominent although still with a lot of drainage. The only remaining wound is over the first metatarsal head on the left foot and this looks to be continuing to  improve with Hydrofera Blue. 2/9; the area on his plantar left foot continues to contract. Callus around the wound edge. The drain sites specifically have not come down in depth. We put the wound VAC on Monday he changed the canister late last night our intake nurse reported a pocket of fluid perhaps caused by our compression wraps 2/16; continued improvement in left foot plantar wound. drainage site in the calf is not improved in terms of depth (wound vac) 2/23; continued improvement in the left foot wound over the first metatarsal head. With regards to the drain sites the area on his thigh laterally is healed however the open area on his calf is small in terms of circumference by still probes in by about 15 cm. Within using the wound VAC. Hydrofera Blue on his foot 08/24/2021: The left first metatarsal head wound continues to improve. The wound bed is healthy with just some surrounding callus. Unfortunately the open drain site on his calf remains open and tunnels at least 15 cm (the extent of a Q-tip). This is despite several weeks of wound VAC treatment. Based on reading back through the notes, there has been really no significant change in the depth of the wound, although the orifice is smaller and the more cranial wound on his thigh has closed. I suspect the tunnel tracks nearly all the way to this location. 08/31/2021: Continued improvement in the left first metatarsal head wound. There has been absolutely no improvement to the long tunnel from his open drain site on his calf. We have tried to get him into see vascular surgery sooner to consider the possibility  of simply filleting the tract open and allowing it to heal from the bottom up, likely with a wound VAC. They have not yet scheduled a sooner appointment than his current mid April 09/14/2021: He was seen by vascular surgery and they took him to the operating room last week. They opened a portion of the tunnel, but did not extend the entire  length of the known open subcutaneous tract. I read Dr. Darcella Cheshire operative note and it is not clear from that documentation why only a portion of the tract was opened. The heaped up granulation tissue was curetted and removed from at least some portion of the tract. They did place a wound VAC and applied an Unna boot to the leg. The ulcer on his left first metatarsal head is smaller today. The bed looks good and there is just a small amount of surrounding callus. 09/21/2021: The ulcer on his left first metatarsal head looks to be stalled. There is some callus surrounding the wound but the wound bed itself does not appear particularly dynamic. The tunnel tract on his lateral left leg seems to be roughly the same length or perhaps slightly smaller but the wound bed appears healthy with good granulation tissue. He opened up a new wound on his medial thigh and the site of a prior surgical incision. He says that he did this unconsciously in his sleep by scratching. 09/28/2021: Unfortunately, the ulcer on his left first metatarsal head has extended underneath the callus toward the dorsum of his foot. The medial thigh wounds are roughly the same. The tunnel on his lateral left leg continues to be problematic; it is longer than we are able to actually probe with a Q-tip. I am still not certain as to why Dr. Randie Heinz did not open this up entirely when he took the patient to the operating room. We will likely be back in the same situation with just a small superficial opening in a long unhealed tract, as the open portion is granulating in nicely. 10/02/2021: The patient was initially scheduled for a nurse visit, but we are also applying a total contact cast today. The plantar foot wound looks clean without significant accumulated callus. We have been applying Prisma silver collagen to the site. 10/05/2021: The patient is here for his first total contact cast change. We have tried using gauze packing strips in the tunnel on  his lateral leg wound, but this does not seem to be working any better than the white VAC foam. The foot ulcer looks about the same with minimal periwound callus. Medial thigh wound is clean with just some overlying eschar. 10/12/2021: The plantar foot wound is stable without any significant accumulation of periwound callus. The surface is viable with good granulation tissue. The medial thigh wounds are much smaller and are epithelializing. On the other hand, he had purulent drainage coming from the tunnel on his lateral leg. He does go back to see Dr. Randie Heinz next week and is planning to ask him why the wound tunnel was not completely opened at the time of his most recent operation. 10/19/2021: The plantar foot wound is markedly improved and has epithelial tissue coming through the surface. The medial thigh wounds are nearly closed with just a tiny open area. He did see Dr. Randie Heinz earlier this week and apparently they did discuss the possibility of opening the sinus tract further and enabling a wound VAC application. Apparently there are some limits as to what Dr. Randie Heinz feels comfortable opening, presumably in  relationship to his bypass graft. I think if we could get the tract open to the level of the popliteal fossa, this would greatly aid in her ability to get this chart closed. That being said, however, today when I probed the tract with a Q-tip, I was not able to insert the entirety of the Q-tip as I have on previous occasions. The tunnel is shorter by about 4 cm. The surface is clean with good granulation tissue and no further episodes of purulent drainage. 10/30/2021: Last week, the patient underwent surgery and had the long tract in his leg opened. There was a rind that was debrided, according to the operative report. His medial thigh ulcers are closed. The plantar foot wound is clean with a good surface and some built up surrounding callus. 11/06/2021: The overall dimensions of the large wound on his  lateral leg remain about the same, but there is good granulation tissue present and the tunneling is [name], [name] ([id number]) [id number] Page 6 of 22 a little bit shorter. He has a new wound on his anterior tibial surface, in the same location where he had a similar lesion in the past. The plantar foot wound is clean with some buildup surrounding callus. Just toward the medial aspect of his foot, however, there is an area of darkening that once debrided, revealed another opening in the skin surface. 11/13/2021: The anterior tibial surface wound is closed. The plantar foot wound has some surrounding callus buildup. The area of darkening that I debrided last week and revealed an opening in the skin surface has closed again. The tunnel in the large wound on his lateral leg has come in by about 3 cm. There is healthy granulation tissue on the entire wound surface. 11/23/2021: The patient was out of town last week and did wet-to-dry dressings on his large wound. 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 Unfortunately, he picked open the wound on his left medial thigh. He says that it was itching and he just could not stop scratching it until it was open again. The wound on his plantar foot is smaller and has not accumulated a tremendous amount of callus. The lateral leg wound is shallower and the tunnel has also decreased in depth. There is just a little bit of slough accumulation on the surface. 11/30/2021: Another portion of his left medial thigh has been opened up. All of these wounds are fairly superficial with just a little bit of slough and eschar accumulation. The wound on his plantar foot is almost closed with just a bit of eschar and periwound callus accumulation. The lateral leg wound is nearly flush with the surrounding skin and the tunnel is markedly shallower. 12/07/2021: There is just 1 open area on  his left medial thigh. It is clean with just a little bit of perimeter eschar. The wound on his plantar foot continues to contract and just has some eschar and periwound callus accumulation. The lateral leg wound is closing at the more distal aspect and the tunnel is smaller. The surface is nearly flush with the surrounding skin and it has a good bed of granulation tissue. 12/14/2021: The thigh and foot wounds are closed. The lateral leg wound has closed over approximately half of its length. The tunnel continues to contract and the surface is now flush with the surrounding skin. The wound bed has robust granulation tissue. 12/22/2021: The thigh and foot wounds have reopened.  The foot wound has a lot of callus accumulation around and over it. The thigh wound is tiny with just a little bit of slough in the wound bed. The lateral leg wound continues to contract. His vascular surgeon took the wound VAC off earlier in the week and the patient has been doing wet-to-dry dressings. There is a little slough accumulation on the surface. The tunnel is about 3 cm in depth at this point. 12/28/2021: The thigh wound is closed again. The foot wound has some callus that subsequently has peeled back exposing just a small slit of a wound. The lateral leg wound Is down to about half the size that it originally was and the tunnel is down to about half a centimeter in depth. 01/04/2022: The thigh wound remains closed. The foot wound has heavy callus overlying the wound site. Once this was debrided, the wound was found to be closed. The lateral leg wound is smaller again this week and very superficial. No tunnel could be identified. 01/12/2022: The thigh and foot wounds both remain closed. The lateral leg wound is now nearly flush with the skin surface. There is good granulation tissue present with a light layer of slough. 01/19/2022: Due to the way his wrap was placed, the patient did not change the dressing on his thigh at all  and so the foam was saturated and his skin is macerated. There is a light layer of slough on the wound surface. The underlying granulation tissue is robust and healthy-appearing. He has heavy callus buildup at the site of his first metatarsal head wound which is still healed. 02/01/2022: He has been in silver alginate. When he removed the dressing from his thigh wound, however, some leg, superficially reopening a portion of the wound that had healed. In addition, underneath the callus at his left first metatarsal head, there appears to be a blister and the wound appears to be open again. 02/08/2022: The lateral leg wound has contracted substantially. There is eschar and a light layer of slough present. He says that it is starting to pull and is uncomfortable. On inspection, there is some puckering of the scar and the eschar is quite dry; this may account for his symptoms. On his first metatarsal head, the wound is much smaller with just some eschar on the surface. The callus has not reaccumulated. He reports that he had a blister come up on his medial thigh wound at the distal aspect. It popped and there is now an opening in his skin again. Looking back through his library of wound photos, there is what looks like a permanent suture just deep to this location and it may be trying to erode through. We have been using silver alginate on his wounds. 02/15/2022: The lateral leg wound is about half the size it was last week. It is clean with just a little perimeter eschar and light slough. The wound on his first metatarsal head is about the same with heavy callus overlying it. The medial thigh wound is closed again. He does have some skin changes on the top of his foot that looks potentially yeast related. 02/22/2022: The skin on the top of his foot improved with the use of a topical antifungal. The lateral leg wound continues to contract and is again smaller this week. There is a little bit of slough and  eschar on the surface. The first metatarsal head wound is a little bit smaller but has reaccumulated a thick callus over the top. He decided  to try to trim his toenail and ultimately took the entire nail off of his left great toe. 03/02/2022: His lateral leg wound continues to improve, as does the wound on his left great toe. Unfortunately, it appears that somehow his foot got wet and moisture seeped in through the opening causing his skin to lift. There is a large wound now overlying his first metatarsal on both the plantar, medial, and dorsal portion of his foot. There is necrotic tissue and slough present underneath the shaggy macerated skin. 03/08/2022: The lateral leg wound is smaller again today. There is just a light layer of slough and eschar on the surface. The great toe wound is smaller again today. The first metatarsal wound is a little bit smaller today and does not look nearly as necrotic and macerated. There is still slough and nonviable tissue present. 03/15/2022: The lateral leg wound is narrower and just has a little bit of light slough buildup. The first metatarsal wound still has a fair amount of moisture affecting the periwound skin. The great toe wound is healed. 03/22/2022: The lateral leg wound is now isolated to just at the level of his knee. There is some eschar and slough accumulation. The first metatarsal head wound has epithelialized tremendously and is about half the size that it was last week. He still has some maceration on the top of his foot and a fungal odor is present. 03/29/2022: T oday the patient's foot was macerated, suggesting that the cast got wet. The patient has also been picking at his dry skin and has enlarged the wound on his left lateral leg. In the time between having his cast removed and my evaluation, he had picked more dry skin and opened up additional wounds on his Achilles area and dorsal foot. The plantar first metatarsal head wound, however, is smaller  and clean with just macerated callus around the perimeter and light slough on the surface. The lateral leg wound measured a little bit larger but is also fairly clean with eschar and minimal slough. 04/02/2022: The patient had vascular studies done last Friday and so his cast was not applied. He is here today to have that done. 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 04/05/2022: Both wounds are smaller and quite clean. There is just a little biofilm on the lateral leg wound. 10/20; the patient has a wound on the left lateral surgical incision at the level of his lateral knee this looks clean and improved. He is using silver alginate. He also has an area on his left medial foot for which she is using Hydrofera Blue under a total contact cast both wounds are measuring smaller 04/20/2022: The plantar foot wound has contracted considerably and is very close to closing. The lateral leg wound was measured a little larger, but there was a tiny open area that was included in the measurements that was not included last week. He has some eschar around the perimeter but otherwise the wound looks clean. 04/27/2022: The lateral leg wound looks better this week. He says that midweek, he felt it was very dry and began applying hydrogel to the site. I think this was beneficial. The foot wound is nearly closed underneath a thick layer of dry skin and callus. KAYMAN, [place] ([id number]) [id number] Page 7 of 22 05/04/2022: The foot wound is healed. He has developed a new small ulcer on his anterior tibial surface about midway up his leg. It has a little slough on the surface. The  lateral leg wound still is fairly dry, but clean with just a little biofilm on the surface. 05/11/2022: The wound on his foot reopened on Wednesday. A large blister formed which then broke open revealing the fat layer underneath. The ulcer on his anterior tibial surface is a little bit larger this week.  The lateral leg wound has much better moisture balance this week. Fortunately, prior to his foot wound reopening, he did get the cast made for his orthotic. 05/15/2022: Already, the left medial foot wound has improved. The tissue is less macerated and the surface is clean. The ulcer on his anterior tibial surface continues to enlarge. This seems likely secondary to accumulated moisture. The lateral leg wound continues to have an improved moisture balance with the use of collagen. 05/25/2022: The medial foot wound continues to contract. It is now substantially smaller with just a little slough on the surface. The anterior tibial surface wound continues to enlarge further. Once again, this seems to be secondary to moisture. The lateral leg wound does not seem to be changing much in size, but the moisture balance is better. 06/01/2022: The anterior tibial wound is closed. The medial foot wound is down to just a very small, couple of millimeters, opening. The lateral leg wound has good moisture balance, but remains unchanged in size. 12/15; the patient's anterior tibial wound has reopened, however the area on his right first metatarsal head is closed. The major wound is actually on the superior part of his surgical wound in the left lateral thigh. Not a completely viable surface under illumination. This may at some point require a debridement I think he is currently using Prisma. As noted the left medial foot wound has closed 06/14/2022: The anterior tibial wound has closed. The lateral leg wound has a better surface but is basically unchanged in size. The left medial foot wound has reopened. It looks as though there was some callus accumulation and moisture got under the callus which caused the tissue to break down again. 06/21/2022: A new wound has opened up just distal to the previous anterior tibial wound. It is small but has hypertrophic granulation tissue present. The lateral leg wound is a little  bit narrower and has a layer of slough on the surface. The left medial foot wound is down to just a pinhole. His custom orthotics should be available next week. 06/28/2022: The wound on his first metatarsal head has healed. He has developed a new small wound on his medial lower leg, in an old scar site. The lateral leg wound continues to contract but continues to accumulate slough, as well. 07/03/2022: Despite wearing his custom orthopedic shoes, he managed to reopen the wound on his first metatarsal head. He says he thinks his foot got wet and then some skin lifted up and he peeled this away. Both of the lower leg wounds are smaller and have some dry eschar on the surface. The lateral leg wound is quite a bit narrower today. 07/12/2022: The medial lower leg wound is closed. The anterior lower leg wound has contracted considerably. The lateral upper leg wound is narrower with a layer of slough on the surface. The first metatarsal head wound is also smaller, but had copious drainage which saturated the foam border dressing and resulted in some periwound tissue maceration. Fortunately there was no breakdown at this site. 07/19/2022: The lower leg shows signs of significant maceration; I think he must be sweating excessively inside his cast. There are several areas of skin  breakdown present. The wound on his foot is smaller and that on his lateral leg is narrower and is shorter by about a centimeter. 07/26/2022: Last week we used a zinc Coflex wrap prior to applying his total contact cast and this has had the effect of keeping his skin from getting macerated this week. The anterior leg wound has epithelialized substantially. The lateral leg wound is significantly smaller with just a bit of slough on the surface. The first metatarsal head wound is also smaller this week. 08/02/2022: The anterior leg wound was closed on arrival, but while he was sitting in the room, he picked it open again. The lateral leg wound is  smaller with just a little slough on the surface and the first metatarsal head wound has contracted further, as well. 08/09/2022: The first metatarsal head wound is covered with callus. Underneath the callus, it is nearly completely closed. The lateral leg wound is smaller again this week. The anterior leg wound looks better, but he has such heavy buildup of old skin, that moisture is getting underneath this, becoming trapped, and causing the underlying skin to get macerated and open up. 08/16/2022: The first metatarsal head wound is closed. The lateral leg wound continues to contract and is quite a bit smaller again this week. There is just a small, superficial opening remaining on his anterior tibial surface. 08/23/2022: The first metatarsal head wound has, by some miracle, remained closed. The lateral leg wound is substantially smaller with multiple areas of epithelialization. The anterior tibial surface wound is also quite a bit smaller and very clean. 08/30/2022: Unfortunately, his first metatarsal head wound opened up again. It happened in the same fashion as it has on prior occasions. Moisture got under dried skin/callus and created a wound when he removed his sock, taking the skin with it. The anterior tibial surface has a thick shell of hyperkeratotic skin. This has been contributing to ongoing repeat wounding events as moisture gets underneath this and causes tissue breakdown. 3/15; patient presents for follow-up. His anterior left leg wound has healed. He still has the wound to the left lateral aspect and left first met head. We have been using silver alginate and endoform to these areas under [employer]. He has no issues or complaints today. He has been taking Augmentin and reports improvement to his symptoms to the left first met head. 09/13/2022: He has accumulated more thick dry skin in sheets on his lower leg. The lateral leg wound is about the same size and the left first metatarsal  head wound is a little bit smaller. There is slough on both surfaces. There is callus buildup around the foot wound. 09/20/2022: The lateral leg wound is a little bit narrower and the left first metatarsal head wound also seems to have contracted slightly. There is slough on both surfaces. He has a little skin breakdown on his anterior tibial surface. 09/27/2022: The lateral leg wound continues to contract and is quite clean. The first metatarsal head wound is also smaller. There is some perimeter callus and slough accumulation on the foot. The anterior tibial surface is closed. 10/04/2022: Both of his wounds are smaller today, particularly the first metatarsal head wound. 10/18/2022: He missed his appointment last week and ended up cutting off his wrap on Saturday. The anterior tibial wound reopened. It is fairly superficial with a little bit of slough on the surface. His lateral leg wound is smaller with some slough and eschar buildup. The first metatarsal head wound is  also smaller with some callus and slough accumulation. 10/25/2022: All wounds are smaller. There is slough and eschar on the lateral leg and slough and callus on the plantar foot wound. The anterior tibial wound is clean and flush with the surrounding skin. No debris accumulation here. 11/01/2022: The wounds are all smaller again this week. There is slough on the lateral leg and some minimal slough and eschar on the anterior tibial wound. There is callus accumulation on the first metatarsal head site, along with slough. There is also a yeasty odor coming from the foot. 11/08/2022: The lateral leg wound is smaller except where he picked some skin while waiting to be seen. There is a little bit of slough on the surface. The anterior SAMRUDH, BORGO ([id number]) [id number] Page 8 of 22 tibial wound is closed. The first metatarsal head site has gotten macerated once again and has a lot of spongy wet tissue and callus  around it. No yeast odor today. 11/15/2022: The lateral leg wound continues to contract. There is now a band of epithelium dividing it into 2 areas. Minimal slough accumulation. He managed to pick open a new wound on his medial lower leg. The first metatarsal head site is smaller with some callus accumulation but no tissue maceration. 11/22/2022: The lateral leg wound is smaller again by about a third today. There is a little bit of slough on the surface with some periwound eschar. The new wound that he picked open last week has healed but he picked open 3 new small wounds on his anterior tibial surface, once again due to his picking at his skin. The first metatarsal head wound looks about the same. There has been some callus accumulation and there is more edema present, as he was not put in a compression wrap last week. 11/29/2022: The lateral leg wound is smaller again today. There is a little periwound eschar and some slough present. The wounds on his anterior tibial surface are all closed except for 1 that has a little bit of slough on the open portion with eschar covering it. The first metatarsal head wound measured a little bit smaller today, but mostly looks about the same. Edema control is better. 12/06/2022: The anterior tibial wound is closed. The lateral leg wound is smaller with some slough and eschar accumulation. The plantar foot ulcer is about the same size, but the tissue does show some evidence of the fact that he was on his feet quite a bit more this past week; there was a death in his family. There has been more callus accumulation and the tissues are little bit more purpleish. 12/13/2022: He picked the skin on his anterior tibia and reopened a wound there while he was waiting to be seen in clinic. The lateral leg wound is much smaller with a little bit of slough and eschar. The plantar foot ulcer is smaller today with callus and slough buildup, but without the pressure induced tissue  injury that was seen last week. 12/20/2022: The anterior tibial wound has healed. The lateral leg wound is smaller again this week. There is a little bit of eschar buildup on the surface. The foot had a fairly strong odor coming from it that persisted even after washing. The wound itself looks like its gotten a little smaller but he has built up thick callus, once again. 12/26/2022: The lateral leg wound is down to just a narrow superficial slit. There is slough and a little bit of dry skin present. The foot  is in much better shape today. There is less callus accumulation and no odor. The skin edges are starting to roll inward, however. 01/03/2023: The lateral leg wound is healed. The skin is quite dry but the wound has closed. The foot continues to contract. He has a fair amount of periwound callus accumulation and the surface of the is a little drier than ideal. 01/10/2023: The large lateral leg wound remains closed. He managed to pick off some of his dry skin and has multiple small open superficial wounds on his lower leg. The foot wound measures smaller, but he has a blister immediately adjacent to it. 01/17/2023: The large lateral leg wound remains closed. The small superficial wounds on his lower leg have also closed. The foot wound is about the same and the blister area immediately adjacent to it has dark eschar on the surface. The foot wound looks a bit dry. 01/24/2023: He picked at some dry skin on his lateral leg wound and reopened. The surface is dry and fibrotic. The foot wound is slightly smaller but has periwound callus and the edges are rolling inward again. 01/31/2023: His lateral leg wound is larger again today. It appears that the Ace bandage may be rubbing and causing some tissue breakdown. His foot wound is also a bit larger and the tissue does not appear particularly healthy. 02/07/2023: The lateral leg wound improved quite significantly over the last week. He has been applying additional  gauze over the site before applying the Ace bandage and this seems to have minimized the rubbing. The foot wound is about the same. The culture that I took last week returned with a polymicrobial population of predominantly skin flora. He is currently taking Augmentin as recommended by the culture data. Electronic Signature(s) Signed: 02/07/2023 10:27:22 AM By: [name] MD FACS Entered By: [name] on 02/07/2023 10:27:22 -------------------------------------------------------------------------------- Chemical Cauterization Details Patient Name: Date of Service: [name]. 02/07/2023 10:00 A M Medical Record Number: [id number] Patient Account Number: [id number] Date of Birth/Sex: Treating RN: 09/03/1950 (72 y.o. [name] Primary Care Provider: Ralene [name] Other Clinician: Referring Provider: Treating Provider/Extender: [name] in Treatment: 21 Procedure Performed for: Wound #18R Left,Plantar Metatarsal head first Performed By: Physician [name], MD Post Procedure Diagnosis Same as Pre-procedure Notes scribed for Dr. Lady Gary by [name] Bruin, RN Electronic Signature(s) Signed: 02/07/2023 10:31:46 AM By: [name] MD FACS Signed: 02/07/2023 3:56:49 PM By: Waverly Ferrari ([id number]) [id number] Page 9 of 22 Signed: 02/07/2023 3:56:49 PM By: Gelene Mink By: [name] on 02/07/2023 10:23:32 -------------------------------------------------------------------------------- Physical Exam Details Patient Name: Date of Service: [name]. 02/07/2023 10:00 A M Medical Record Number: [id number] Patient Account Number: [id number] Date of Birth/Sex: Treating RN: 03/01/1951 (71 y.o. M) Primary Care Provider: Ralene [name] Other Clinician: Referring Provider: Treating Provider/Extender: [name] in Treatment:  95 Constitutional Hypertensive, asymptomatic. . . . no acute distress. Respiratory Normal work of breathing on room air. Notes 02/07/2023: The lateral leg wound improved quite significantly over the last week. He has been applying additional gauze over the site before applying the Ace bandage and this seems to have minimized the rubbing. The foot wound is about the same. Electronic Signature(s) Signed: 02/07/2023 10:28:01 AM By: [name] MD FACS Entered By: [name] on 02/07/2023 10:28:01 -------------------------------------------------------------------------------- Physician Orders Details Patient Name: Date of Service: [name]. 02/07/2023 10:00 A M Medical Record Number: [id number] Patient Account Number: [id number]  Date of Birth/Sex: Treating RN: 09/05/1950 (72 y.o. 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 - Dr Lady Gary - Room 2 Return A Anesthetic (In clinic) Topical Lidocaine 5% applied to wound bed Bathing/ Shower/ Hygiene May shower with protection but do not get wound dressing(s) wet. Protect dressing(s) with water repellant cover (for example, large plastic bag) or a cast cover and may then take shower. Edema Control - Lymphedema / SCD / SAGEN, AREOLA ([id number])  [id number] Page 10 of 22 Elevate legs to the level of the heart or above for 30 minutes daily and/or when sitting for 3-4 times a day throughout the day. Avoid standing for long periods of time. Patient to wear own compression stockings every day. - both legs daily Moisturize legs daily. Compression stocking or Garment 20-30 mm/Hg pressure to: Off-Loading Wound #18R Left,Plantar Metatarsal head first Open toe surgical shoe to: - Front off loader Left ft Other: - minimal weight bearing left foot Additional Orders / Instructions Follow Nutritious Diet - vitamin C 500 mg 3 times a day and zinc 30-50 mg a day Wound Treatment Wound #18R - Metatarsal head first Wound Laterality: Plantar, Left Cleanser: Soap and Water 1 x Per Week/30 Days Discharge Instructions: May shower and wash wound with dial antibacterial soap and water prior to dressing change. Cleanser: Wound Cleanser 1 x Per Week/30 Days Discharge Instructions: Cleanse the wound with wound cleanser prior to applying a clean dressing using gauze sponges, not tissue or cotton balls. Peri-Wound Care: Ketoconazole Cream 2% 1 x Per Week/30 Days Discharge Instructions: Apply Ketoconazole as directed Peri-Wound Care: Triamcinolone 15 (g) 1 x Per Week/30 Days Discharge Instructions: Use triamcinolone 15 (g) as directed Topical: Gentamicin 1 x Per Week/30 Days Discharge Instructions: As directed by physician Topical: Mupirocin Ointment 1 x Per Week/30 Days Discharge Instructions: Apply Mupirocin (Bactroban) as instructed Prim Dressing: Maxorb Extra Ag+ Alginate Dressing, 2x2 (in/in) 1 x Per Week/30 Days ary Discharge Instructions: Apply to wound bed as instructed Secondary Dressing: Optifoam Non-Adhesive Dressing, 4x4 in (Generic) 1 x Per Week/30 Days Discharge Instructions: Apply over primary dressing as directed. Secondary Dressing: Woven Gauze Sponges 2x2 in (Generic) 1 x Per Week/30 Days Discharge  Instructions: Apply over primary dressing as directed. Secondary Dressing: Zetuvit Absorbent Pad, 4x4 (in/in) 1 x Per Week/30 Days Compression Wrap: Urgo K2, (equivalent to a 4 layer) two layer compression system, regular 1 x Per Week/30 Days Discharge Instructions: Apply Urgo K2 as directed (alternative to 4 layer compression). Wound #22R - Lower Leg Wound Laterality: Left, Lateral, Proximal Cleanser: Soap and Water 1 x Per Day/30 Days Discharge Instructions: May shower and wash wound with dial antibacterial soap and water prior to dressing change. Cleanser: Wound Cleanser 1 x Per Day/30 Days Discharge Instructions: Cleanse the wound with wound cleanser prior to applying a clean dressing using gauze sponges, not tissue or cotton balls. Prim Dressing: Endoform 2x2 in 1 x Per Day/30 Days ary Discharge Instructions: Moisten with saline Secondary Dressing: Woven Gauze  Sponge, Non-Sterile 4x4 in 1 x Per Day/30 Days Discharge Instructions: Apply over primary dressing as directed. Secured With: Elastic Bandage 4 inch (ACE bandage) 1 x Per Day/30 Days Discharge Instructions: Secure with ACE bandage as directed. Secured With: [employer], 4.5x3.1 (in/yd) 1 x Per Day/30 Days Discharge Instructions: Secure with Kerlix as directed. Patient Medications llergies: No Known Drug Allergies A Notifications Medication Indication Start End 02/07/2023 lidocaine DOSE topical 5 % ointment - ointment topical ADLEY, ANDERSEN ([id number]) [phone].pdf Page 11 of 22 Electronic Signature(s) Signed: 02/07/2023 10:31:46 AM By: [name] MD FACS Entered By: [name] on 02/07/2023 40:34:74 -------------------------------------------------------------------------------- Problem List Details Patient Name: Date of Service: [name]. 02/07/2023 10:00 A M Medical Record Number: [id number] Patient Account Number: [id number] Date of Birth/Sex: Treating RN: 08/25/1950  (72 y.o. 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 01/24/2023 No Yes I87.322 Chronic venous hypertension (idiopathic) with inflammation of left lower 04/12/2021 No Yes extremity E11.51 Type 2 diabetes mellitus with diabetic peripheral angiopathy without gangrene 04/12/2021 No Yes I89.0 Lymphedema, not elsewhere classified 04/12/2021 No Yes L85.9 Epidermal thickening, unspecified 08/30/2022 No Yes Inactive Problems ICD-10 Code Description Active Date Inactive Date E11.621 Type 2 diabetes mellitus with foot ulcer 04/12/2021 04/12/2021 E11.42 Type 2 diabetes mellitus with diabetic polyneuropathy 04/12/2021 04/12/2021 L02.416 Cutaneous abscess of left lower limb 06/13/2021 06/13/2021 Resolved Problems ICD-10 Code Description Active Date Resolved Date L97.828 Non-pressure chronic ulcer of other part of left lower leg with other specified severity 04/12/2021 04/12/2021 [name] ([id number]) [phone].pdf Page 12 of 22 [phone] Non-pressure chronic ulcer of other part of left lower leg with other specified severity 01/10/2023 06/08/2022 Electronic Signature(s) Signed: 02/07/2023 10:25:45 AM By: [name] MD FACS Entered By: [name] on 02/07/2023 10:25:45 -------------------------------------------------------------------------------- Progress Note Details Patient Name: Date of Service: [name]. 02/07/2023 10:00 A M Medical Record Number: [id number] Patient Account Number: [id number] Date of Birth/Sex: Treating RN: 1951/04/04 (72 y.o. M) Primary Care Provider: Ralene [name] Other Clinician: Referring Provider: Treating Provider/Extender: [name] in Treatment: 95 Subjective Chief Complaint Information obtained from Patient Left leg and foot ulcers 04/12/2021; patient is here for wounds on his left lower leg and left plantar foot over the first metatarsal head History of Present Illness (HPI) 10/11/17; Mr. Lybbert is a 72 year old man who tells me that in 2015 he slipped down the latter traumatizing his left leg. He developed a wound in the same spot the area that we are currently looking at. He states this closed over for the most part although he always felt it was somewhat unstable. In 2016 he hit the same area with the door of his car had this reopened. He tells me that this is never really closed although sometimes an inflow it remains open on a constant basis. He has not been using any specific dressing to this except for topical antibiotics the nature of which were not really sure. His primary doctor did send him to see Dr. Jacinto Halim of interventional cardiology. He underwent an angiogram on 08/06/17 and he underwent a PTA and directional atherectomy of the lesser distal SFA and popliteal arteries which resulted in brisk improvement in blood flow.  It was noted that he had 2 vessel runoff through the anterior tibial and peroneal. He is also been to see vascular and interventional radiologist. He was not felt to have any significant superficial venous insufficiency. Presumably is not a candidate for any ablation. It was suggested he come here for wound care. The patient is a type II diabetic on insulin. He also has a history of venous insufficiency. ABIs on the left were noncompressible in our clinic 10/21/17; patient we admitted to the clinic last week. 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 We put Iodosorb on him after an aggressive debridement and 3 layer compression.  He complained of pain in his ankle and itching with is skin in fact he scratched the area on the medial calf superiorly at the rim of our wraps and he has 2 small open areas in that location today which are new. I changed his primary dressing today to silver collagen. As noted he is already had revascularization and does not have any significant superficial venous insufficiency that would be amenable to ablation 10/28/17; patient admitted to the clinic 2 weeks ago. He has a smaller Wound. Scratch injury from last week revealed. There is large wound over the tibial area. This is smaller. Granulation looks healthy. No need for debridement. 11/04/17; the wound on the left lateral calf looks better. Improved dimensions. Surface of this looks better. We've been maintaining him and Kerlix Coban wraps. He finds this much more comfortable. Silver collagen dressing 11/11/17; left lateral Wound continues to look healthy be making progress. Using a #5 curet I removed removed nonviable skin from the surface of the wound and then necrotic debris from the wound surface. Surface of the wound continues to look healthy. He also has an open area on the left great toenail bed. We've been using topical antibiotics. 11/19/17; left anterior lateral wound continues to look healthy but it's not closed. He also had a small wound above this on the left leg Initially traumatic wounds in the setting of significant chronic venous insufficiency and stasis dermatitis 11/25/17; left anterior wounds superiorly is closed still a small wound inferiorly. 12/02/17; left anterior tibial area. Arrives today with adherent callus. Post debridement clearly not completely closed. Hydrofera Blue under 3 layer compression. 12/09/17; left anterior tibia. Circumferential eschar however the wound bed looks stable to improved. We've been using Hydrofera Blue under 3 layer compression 12/17/17; left anterior tibia. Apparently this was felt to be closed  however when the wrap was taken off there is a skin tear to reopen wounds in the same area we've been using Hydrofera Blue under 3 layer compression 12/23/17 left anterior tibia. Not close to close this week apparently the [facility] was stuck to this again. Still circumferential eschar requiring debridement. I put a contact layer on this this time under the Hydrofera Blue 12/31/17; left anterior tibia. Wound is better slight amount of hyper-granulation. Using Hydrofera Blue over Adaptic. 01/07/18; left anterior tibia. The wound had some surface eschar however after this was removed he has no open wound.he was already revascularized by Dr. Jacinto Halim when he came to our clinic with atherectomy of the left SFA and popliteal artery. He was also sent to interventional radiology for venous reflux studies. He was not felt to have significant reflux but certainly has chronic venous changes of his skin with hemosiderin deposition around this area. He will definitely need to lubricate his skin and wear  compression stocking and I've talked to him about this. READMISSION 05/26/2018 This is a now 72 year old man we cared for with traumatic wounds on his left anterior lower extremity. He had been previously revascularized during that admission by Dr. Jacinto Halim. Apparently in follow-up Dr. Jacinto Halim noted that he had deterioration in his arterial status. He underwent a stent placement in the distal left SFA on 04/22/2018. 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 He went back to the angiography suite on 04/30/2018 he underwent PTA and balloon angioplasty of the occluded left mid anterior tibial artery, thrombotic occlusion went from 100 to 0% which reconstitutes the posterior tibial artery. He had thrombectomy and aspiration of the peroneal artery. The stent placed in the distal SFA left SFA was still occluded. He was discharged on Xarelto, it was noted on the discharge summary from this hospitalization that he  had gangrene at the tip of his left fifth toe and there were expectations this would auto [name], [name] ([id number]) [id number] Page 13 of 22 amputate. 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 He has been recuperating at [facility] home in [facility] after the most recent hospitalization. He is going home tomorrow. He tells me that 2 weeks ago he traumatized the tip of his left fifth toe. He came in urgently for our review of this. This was a history of before I noted that Dr. Jacinto Halim had already noted dry gangrenous changes of the left fifth toe 06/09/2018; 2-week follow-up. I did contact Dr. Jacinto Halim after his last appointment and he apparently saw 1 of Dr. Verl Dicker colleagues the next day. He does not follow-up with Dr. Jacinto Halim himself until Thursday of this week. 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 Nevertheless there is no evidence of infection no drainage and no pain. He had a new area that this week when we were signing him in today on the left anterior mid tibia area, this is in close proximity to the previous wound we have dealt with in this clinic. 06/23/2018; 2-week follow-up. I did not receive a recent note from Dr. Jacinto Halim to review today. Our office is trying to obtain this. He is apparently not planning to do further vascular interventions and wondered about compression to try and help with the patient's chronic venous insufficiency. However we are also concerned about the arterial flow. He arrives in clinic today with a new area on the left third toe. The areas on the calf/anterior tibia are close to closing. The left fifth toe is still mummified using Betadine. -In reviewing things with the patient he has what sounds like claudication with mild to moderate amount of activity. 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 I prescribed Levaquin over the  phone while we attempted to arrange a plan of care. However the patient called yesterday to report he had low-grade fever and he came in today acutely. There is been a marked deterioration in the left third toe with spreading cellulitis up into the dorsal left foot. He was referred to the emergency room. Readmission: 06/29/2020 patient presents today for reevaluation here in our clinic he was previously treated by Dr. Leanord Hawking at the latter part of 2019 in 2 the beginning of 2020. Subsequently we have not seen him since that time in the interim he did have evaluation with vein and vascular specialist specifically Dr. Bo Mcclintock who did perform quite  extensive work for a left femoral to anterior tibial artery bypass. With that being said in the interim the patient has developed significant lymphedema and has wounds that he tells me have really never healed in regard to the incision site on the left leg. He also has multiple wounds on the feet for various reasons some of which is that he tends to pick at his feet. Fortunately there is no signs of active infection systemically at this time he does have some wounds that are little bit deeper but most are fairly superficial he seems to have good blood flow and overall everything appears to be healthy I see no bone exposed and no obvious signs of osteomyelitis. I do not know that he necessarily needs a x-ray at this point although that something we could consider depending on how things progress. The patient does have a history of lymphedema, diabetes, this is type II, chronic kidney disease stage III, hypertension, and history of peripheral vascular disease. 07/05/2020; patient admitted last week. Is a patient I remember from 2019 he had a spreading infection involving the left foot and we sent him to the hospital. 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 He subsequently had a left femoral to anterior tibial bypass by  Dr.[facility] vein and vascular. He also has severe lymphedema with chronic skin changes related to that on the left leg. The most problematic area that was new today was on the left medial great toe. This was apparently a small area last week there was purulent drainage which our intake nurse cultured. Also areas on the left medial foot and heel left lateral foot. He has 2 areas on the left medial calf left lateral calf in the setting of the severe lymphedema. 07/13/2020 on evaluation today patient appears to be doing better in my opinion compared to his last visit. The good news is there is no signs of active infection systemically and locally I do not see any signs of infection either. He did have an x-ray which was negative that is great news he had a culture which showed MRSA but at the same time he is been on the doxycycline which has helped. I do think we may want to extend this for 7 additional days 1/25; patient admitted to the clinic a few weeks ago. 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 We have been putting him under compression his edema control is a lot better but he is severe verricused skin on the left leg. He is really done quite well he still has an open area on the left medial calf and the left medial first metatarsal head. We have been using silver collagen on the leg silver alginate on the foot 07/27/2020 upon evaluation today patient appears to be doing decently well in regard to his wounds. He still has a lot of dry skin on the left leg. Some of this is starting to peel back and I think he may be able to have them out by removing some that today. Fortunately there is no signs of active infection at this time on the left leg although on the right leg he does appear to have swelling and erythema as well as some mild warmth to touch. This does have been concerned about the possibility of cellulitis although within the differential diagnosis I  do think that potentially a DVT has to be at least considered. We need  to rule that out before proceeding would just call in the cellulitis. Especially since he is having pain in the posterior aspect of his calf muscle. 2/8; the patient had seen sparingly. He has severe skin changes of chronic lymphedema in the left leg thickened hyperkeratotic verrucous skin. He has an open wound on the medial part of the left first met head left mid tibia. He also has a rim of nonepithelialized skin in the anterior mid tibia. He brought in the AmLactin lotion that was been prescribed although I am not sure under compression and its utility. There concern about cellulitis on the right lower leg the last time he was here. He was put on on antibiotics. His DVT rule out was negative. The right leg looks fine he is using his stocking on this area 08/10/2020 upon evaluation today patient appears to be doing well with regard to his leg currently. He has been tolerating the dressing changes without complication. Fortunately there is no signs of active infection which is great news. Overall very pleased with where things stand. 2/22; the patient still has an area on the medial part of the left first met his head. This looks better than when I last saw this earlier this month he has a rim of epithelialization but still some surface debris. Mostly everything on the left leg is healed. There is still a vulnerable in the left mid tibia area. 08/30/2020 upon evaluation today patient appears to be doing much better in regard to his wounds on his foot. 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 Nonetheless I think we will address that today I Minna send in a prescription for him in that regard. Overall though there does not appear to be any signs of significant worsening. 09/07/2020 on evaluation  today patient's wounds over his left foot appear to be doing excellent. I do not see any signs of infection there is some callus buildup this can require debridement for certain but overall I feel like he is managing quite nicely. He still using the AmLactin cream which has been beneficial for him as well. 3/22; left foot wound is closed. There is no open area here. He is using ammonium lactate lotion to the lower extremities to help exfoliate dry cracked skin. He has compression stockings from [employer] in [place]. The wound on the medial part of his left first met head is healed today. READMISSION 04/12/2021 Mr. Dohn is a patient we know fairly well he had a prolonged stay in clinic in 2019 with wounds on his left lateral and left anterior lower extremity in the setting of chronic venous insufficiency. More recently he was here earlier this year with predominantly an area on his left foot first metatarsal head plantar and he says the plantar foot broke down on its not long after we discharged him but he did not come back here. The last few months areas of broken down on his left anterior and again the left lateral lower extremity. The leg itself is very swollen chronically enlarged a lot of hyperkeratotic dry Berry Q skin in the left lower leg. His edema extends well into the thigh. He was seen by Dr. Randie Heinz. He had ABIs on 03/02/2021 showing an ABI on the right of 1 with a TBI of 0.72 his ABI in the left at 1.09 TBI of 0.99. Monophasic and  biphasic waveforms on the right. On the left monophasic waveforms were noted he went on to have an angiogram on 03/27/2021 this showed the aortic aortic and iliac segments were free of flow-limiting stenosis the left common femoral vein to evaluate the left femoral to anterior tibial artery bypass was unobstructed the bypass was patent without any areas of stenosis. We discharged the patient in bilateral juxta lite stockings but very clearly that was not  sufficient to control the swelling and maintain skin integrity. He is clearly going to need compression pumps. 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 This is fortunate because he is on his feet for long periods of time. [name], MELAHN ([id number]) [id number] Page 14 of 22 10/27; patient comes in with our intake nurse reporting copious amount of green drainage from the left anterior mid tibia the left dorsal foot and to a lesser extent the left medial mid tibia. We left the compression wrap on all week for the amount of edema in his left leg is quite a bit better. We use silver alginate as the primary dressing 11/3; edema control is good. Left anterior lower leg left medial lower leg and the plantar first metatarsal head. The left anterior lower leg required debridement. Deep tissue culture I did of this wound showed MRSA I put him on 10 days of doxycycline which she will start today. We have him in compression wraps. He has a security card and [employer] however he is retiring on November 15. We will need to then get him into a better offloading boot for the left foot perhaps a total contact cast 11/10; edema control is quite good. Left anterior and left medial lower leg wounds in the setting of chronic venous insufficiency and lymphedema. He also has a substantial area over the left plantar first metatarsal head. 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 He has significant hypergranulation on the left plantar foot wound. The patient is a diabetic but he does not have significant PAD 11/17; edema control is quite good. Left anterior and left medial lower leg wounds look better. The really concerning area remains the area on the left plantar first metatarsal head. He has a rim of epithelialization. He has been using a surgical  shoe The patient is now retired from a a [employer] I have gone over with him the need to offload this area aggressively. Starting today with a forefoot off loader but . possibly a total contact cast. 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 Arrives with a very significant odor from I think all of the wounds on the left leg and the left foot. Because of this I did not put a total contact cast on him today but will could still consider this. His wife was having cataract surgery which is the reason he missed the appointment 12/6. I saw this man 5 days ago with a swelling below the popliteal fossa. I thought he actually might have a Baker's cyst however the DVT rule out study that we could arrange right away was negative the technician told me this was not a ruptured Baker's cyst. We attempted to get this aspirated by under ultrasound guidance in  interventional radiology however all they did was an ultrasound however it shows an extensive fluid collection 62 x 8 x 9.4 in the left thigh and left calf. The patient states he thinks this started 8 days ago or so but he really is not complaining of any pain, fever or systemic symptoms. He has not ha 12/20; after some difficulty I managed to get the patient into see Dr. Randie Heinz. Eventually he was taken into the hospital and had a drain put in the fluid collection below his left knee posteriorly extending into the posterior thigh. He still has the drain in place. Culture of this showed moderate staff aureus few Morganella and few Klebsiella he is now on doxycycline and ciprofloxacin as suggested by infectious disease he is on this for a month. The drain will remain in place until it stops draining 12/29; he comes in today with the 1 wound on his left leg and the area on the left plantar first met head significantly smaller. Both look healthy. He  still has the drain in the left leg. He says he has to change this daily. Follows up with Dr. Randie Heinz on January 11. 06/29/2021; the wounds that I am following on the left leg and left first met head continued to be quite healthy. However the area where his inferior drain is in place had copious amounts of drainage which was green in color. The wound here is larger. Follows up with Dr. Pascal Lux of vein and vascular his surgeon next week as well as infectious disease. He remains on ciprofloxacin and doxycycline. He is not complaining of excessive pain in either one of the drain areas 1/12; the patient saw vascular surgery and infectious disease. Vascular surgery has left the drain in place as there was still some notable drainage still see him back in 2 weeks. Dr. Dorthula Perfect stop the doxycycline and ciprofloxacin and I do not believe he follows up with them at this point. Culture I did last week showed both doxycycline resistant MRSA and Pseudomonas not sensitive to ciprofloxacin although only in rare titers 1/19; the patient's wound on the left anterior lower leg is just about healed. We have continued healing of the area that was medially on the left leg. Left first plantar metatarsal head continues to get smaller. The major problem here is his 2 drain sites 1 on the left upper calf and lateral thigh. There is purulent drainage still from the left lateral thigh. I gave him antibiotics last week but we still have recultured. He has the drain in the area I think this is eventually going to have to come out. I suspect there will be a connecting wound to heal here perhaps with improved VAc 1/26; the patient had his drain removed by vein and vascular on 1/25/. This was a large pocket of fluid in his left thigh that seem to tunnel into his left upper calf. 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 His mention his Penrose drain was removed today. He now has a tunneling wound on his left calf and left  thigh. Both of these probe widely towards each other although I cannot really prove that they connect. Both wounds on his lower leg anteriorly are closed and his area over the first metatarsal head on his right foot continues to improve. We are using Hydrofera Blue here. He also saw infectious disease culture of the abscess they noted was polymicrobial with MRSA, Morganella and Klebsiella he was treated with doxycycline and  ciprofloxacin for 4 weeks ending on 07/03/2021. They did not recommend any further antibiotics. Notable that while he still had the Penrose drain in place last week he had purulent drainage coming out of the inferior IandD site this grew [facility], MRSA and Pseudomonas but there does not appear to be any active infection in this area today with the drain out and he is not systemically unwell 2/2; with regards to the drain sites the superior one on the thigh actually is closed down the one on the upper left lateral calf measures about 8 and half centimeters which is an improvement seems to be less prominent although still with a lot of drainage. The only remaining wound is over the first metatarsal head on the left foot and this looks to be continuing to improve with Hydrofera Blue. 2/9; the area on his plantar left foot continues to contract. Callus around the wound edge. The drain sites specifically have not come down in depth. We put the wound VAC on Monday he changed the canister late last night our intake nurse reported a pocket of fluid perhaps caused by our compression wraps 2/16; continued improvement in left foot plantar wound. drainage site in the calf is not improved in terms of depth (wound vac) 2/23; continued improvement in the left foot wound over the first metatarsal head. With regards to the drain sites the area on his thigh laterally is healed however the open area on his calf is small in terms of circumference by still probes in by about 15 cm. Within using the  wound VAC. Hydrofera Blue on his foot 08/24/2021: The left first metatarsal head wound continues to improve. The wound bed is healthy with just some surrounding callus. Unfortunately the open drain site on his calf remains open and tunnels at least 15 cm (the extent of a Q-tip). This is despite several weeks of wound VAC treatment. Based on reading back through the notes, there has been really no significant change in the depth of the wound, although the orifice is smaller and the more cranial wound on his thigh has closed. I suspect the tunnel tracks nearly all the way to this location. 08/31/2021: Continued improvement in the left first metatarsal head wound. There has been absolutely no improvement to the long tunnel from his open drain site on his calf. We have tried to get him into see vascular surgery sooner to consider the possibility of simply filleting the tract open and allowing it to heal from the bottom up, likely with a wound VAC. They have not yet scheduled a sooner appointment than his current mid April 09/14/2021: He was seen by vascular surgery and they took him to the operating room last week. They opened a portion of the tunnel, but did not extend the entire length of the known open subcutaneous tract. I read Dr. Darcella Cheshire operative note and it is not clear from that documentation why only a portion of the tract was opened. The heaped up granulation tissue was curetted and removed from at least some portion of the tract. They did place a wound VAC and applied an Unna boot to the leg. The ulcer on his left first metatarsal head is smaller today. The bed looks good and there is just a small amount of surrounding callus. 09/21/2021: The ulcer on his left first metatarsal head looks to be stalled. There is some callus surrounding the wound but the wound bed itself does not appear particularly dynamic. The tunnel tract on his  lateral left leg seems to be roughly the same length or perhaps slightly  smaller but the wound bed appears healthy with good granulation tissue. He opened up a new wound on his medial thigh and the site of a prior surgical incision. He says that he did this unconsciously in his sleep by scratching. [name], [name] ([id number]) [id number] Page 15 of 22 09/28/2021: Unfortunately, the ulcer on his left first metatarsal head has extended underneath the callus toward the dorsum of his foot. The medial thigh wounds are roughly the same. The tunnel on his lateral left leg continues to be problematic; it is longer than we are able to actually probe with a Q-tip. I am still not certain as to why Dr. Randie Heinz did not open this up entirely when he took the patient to the operating room. We will likely be back in the same situation with just a small superficial opening in a long unhealed tract, as the open portion is granulating in nicely. 10/02/2021: The patient was initially scheduled for a nurse visit, but we are also applying a total contact cast today. The plantar foot wound looks clean without significant accumulated callus. We have been applying Prisma silver collagen to the site. 10/05/2021: The patient is here for his first total contact cast change. We have tried using gauze packing strips in the tunnel on his lateral leg wound, but this does not seem to be working any better than the white VAC foam. The foot ulcer looks about the same with minimal periwound callus. Medial thigh wound is clean with just some overlying eschar. 10/12/2021: The plantar foot wound is stable without any significant accumulation of periwound callus. The surface is viable with good granulation tissue. The medial thigh wounds are much smaller and are epithelializing. On the other hand, he had purulent drainage coming from the tunnel on his lateral leg. He does go back to see Dr. Randie Heinz next week and is planning to ask him why the wound tunnel was not completely opened at the time of  his most recent operation. 10/19/2021: The plantar foot wound is markedly improved and has epithelial tissue coming through the surface. The medial thigh wounds are nearly closed with just a tiny open area. He did see Dr. Randie Heinz earlier this week and apparently they did discuss the possibility of opening the sinus tract further and enabling a wound VAC application. Apparently there are some limits as to what Dr. 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 I think if we could get the tract open to the level of the popliteal fossa, this would greatly aid in her ability to get this chart closed. That being said, however, today when I probed the tract with a Q-tip, I was not able to insert the entirety of the Q-tip as I have on previous occasions. The tunnel is shorter by about 4 cm. The surface is clean with good granulation tissue and no further episodes of purulent drainage. 10/30/2021: Last week, the patient underwent surgery and had the long tract in his leg opened. There was a rind that was debrided, according to the operative report. His medial thigh ulcers are closed. The plantar foot wound is clean with a good surface and some built up surrounding callus. 11/06/2021: The overall dimensions of the large wound on his lateral leg remain about the same, but there is good granulation tissue present and the tunneling is a little bit shorter. He has a new wound on his anterior  tibial surface, in the same location where he had a similar lesion in the past. The plantar foot wound is clean with some buildup surrounding callus. Just toward the medial aspect of his foot, however, there is an area of darkening that once debrided, revealed another opening in the skin surface. 11/13/2021: The anterior tibial surface wound is closed. The plantar foot wound has some surrounding callus buildup. The area of darkening that I debrided last week and revealed an opening in the skin surface  has closed again. The tunnel in the large wound on his lateral leg has come in by about 3 cm. There is healthy granulation tissue on the entire wound surface. 11/23/2021: The patient was out of town last week and did wet-to-dry dressings on his large wound. 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 Unfortunately, he picked open the wound on his left medial thigh. He says that it was itching and he just could not stop scratching it until it was open again. The wound on his plantar foot is smaller and has not accumulated a tremendous amount of callus. The lateral leg wound is shallower and the tunnel has also decreased in depth. There is just a little bit of slough accumulation on the surface. 11/30/2021: Another portion of his left medial thigh has been opened up. All of these wounds are fairly superficial with just a little bit of slough and eschar accumulation. The wound on his plantar foot is almost closed with just a bit of eschar and periwound callus accumulation. The lateral leg wound is nearly flush with the surrounding skin and the tunnel is markedly shallower. 12/07/2021: There is just 1 open area on his left medial thigh. It is clean with just a little bit of perimeter eschar. The wound on his plantar foot continues to contract and just has some eschar and periwound callus accumulation. The lateral leg wound is closing at the more distal aspect and the tunnel is smaller. The surface is nearly flush with the surrounding skin and it has a good bed of granulation tissue. 12/14/2021: The thigh and foot wounds are closed. The lateral leg wound has closed over approximately half of its length. The tunnel continues to contract and the surface is now flush with the surrounding skin. The wound bed has robust granulation tissue. 12/22/2021: The thigh and foot wounds have reopened. The foot wound has a lot of callus accumulation around and over  it. The thigh wound is tiny with just a little bit of slough in the wound bed. The lateral leg wound continues to contract. His vascular surgeon took the wound VAC off earlier in the week and the patient has been doing wet-to-dry dressings. There is a little slough accumulation on the surface. The tunnel is about 3 cm in depth at this point. 12/28/2021: The thigh wound is closed again. The foot wound has some callus that subsequently has peeled back exposing just a small slit of a wound. The lateral leg wound Is down to about half the size that it originally was and the tunnel is down to about half a centimeter in depth. 01/04/2022: The thigh wound remains closed. The foot wound has heavy callus overlying the wound site. Once this was debrided, the wound was found to be closed. The lateral leg wound is smaller again this week and very superficial. No tunnel could be identified. 01/12/2022: The thigh and foot wounds both remain closed. The lateral  leg wound is now nearly flush with the skin surface. There is good granulation tissue present with a light layer of slough. 01/19/2022: Due to the way his wrap was placed, the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

## 2023-02-07 NOTE — Progress Notes (Signed)
[name] ([id number]) [id number] Page 1 of 9 Visit Report for 02/07/2023 Arrival Information Details Patient Name: Date of Service: [name], [name] 02/07/2023 10:00 A M Medical Record Number: [id number] Patient Account Number: [id number] Date of Birth/Sex: Treating RN: [birth year] (72 y.o. [name] Primary Care : [name] Other Clinician: Referring : Treating /Extender: [name] in Treatment: 95 Visit Information History Since Last Visit Added or deleted any medications: No Patient Arrived: Ambulatory Any new allergies or adverse reactions: No Arrival Time: 10:04 Had a fall or experienced change in No Accompanied By: sister activities of daily living that may affect Transfer Assistance: None risk of falls: Patient Identification Verified: Yes Signs or symptoms of abuse/neglect since last visito No Secondary Verification Process Completed: Yes Hospitalized since last visit: No Patient Requires Transmission-Based Precautions: No Implantable device outside of the clinic excluding No Patient Has Alerts: Yes cellular tissue based products placed in the center since last visit: Has Dressing in Place as Prescribed: Yes Has Compression in Place as Prescribed: Yes Pain Present Now: No Electronic Signature(s) Signed: 02/07/2023 3:56:49 PM By: [name] Entered By: [name] on 02/07/2023 10:04:24 -------------------------------------------------------------------------------- Compression Therapy Details Patient Name: Date of Service: [name]. 02/07/2023 10:00 A M Medical Record Number: [id number] Patient Account Number: [id number] Date of Birth/Sex: Treating RN: [birth year] (72 y.o. [name] Primary Care : [name] Other Clinician: Referring : Treating /Extender: [name] in Treatment:  95 Compression Therapy Performed for Wound Assessment: Wound #22R Left,Proximal,Lateral Lower Leg Performed By: Clinician [name], RN Compression Type: Double Layer Post Procedure Diagnosis Same as Pre-procedure Electronic Signature(s) Signed: 02/07/2023 3:56:49 PM By: [name] Entered By: [name] on 02/07/2023 10:21:50 [name] ([id number]) [id number] Page 2 of 9 -------------------------------------------------------------------------------- Encounter Discharge Information Details Patient Name: Date of Service: [name], [name] 02/07/2023 10:00 A M Medical Record Number: [id number] Patient Account Number: [id number] Date of Birth/Sex: Treating RN: [birth year] (71 y.o. [name] Primary Care : [name] Other Clinician: Referring : Treating /Extender: [name] in Treatment: 95 Encounter Discharge Information Items Post Procedure Vitals Discharge Condition: Stable Temperature (F): 98.5 Ambulatory Status: Ambulatory Pulse (bpm): 76 Discharge Destination: Home Respiratory Rate (breaths/min): 18 Transportation: Private Auto Blood Pressure (mmHg): 149/68 Accompanied By: sister Schedule Follow-up Appointment: Yes Clinical Summary of Care: Patient Declined Electronic Signature(s) Signed: 02/07/2023 3:56:49 PM By: [name] Entered By: [name] on 02/07/2023 11:11:42 -------------------------------------------------------------------------------- Lower Extremity Assessment Details Patient Name: Date of Service: [name], [name] 02/07/2023 10:00 A M Medical Record Number: [id number] Patient Account Number: [id number] Date of Birth/Sex: Treating RN: 18-Nov-1950 (72 y.o. [name] Primary Care : [name] Other Clinician: Referring : Treating /Extender: [name] in Treatment:  95 Edema Assessment Assessed: [name]: No] [name]: No] Edema: [Left: Ye] [Right: s] Calf Left: Right: Point of Measurement: 41 cm From Medial Instep 42.5 cm Ankle Left: Right: Point of Measurement: 10 cm From Medial Instep 27 cm Vascular Assessment Pulses: Dorsalis Pedis Palpable: [Left:Yes] Extremity colors, hair growth, and conditions: Hair Growth on Extremity: [Left:No] Temperature of Extremity: [Left:Warm] Capillary Refill: [Left:< 3 seconds] Dependent Rubor: [Left:No No] Electronic Signature(s) Signed: 02/07/2023 3:56:49 PM By: [name] Entered By: [name] on 02/07/2023 10:10:22 [name] ([id number]) [id number] Page 3 of 9 -------------------------------------------------------------------------------- Multi Wound Chart Details Patient Name: Date of Service: [name], [name] 02/07/2023 10:00 A M Medical Record Number: [id number]  Patient Account Number: [id number] Date of Birth/Sex: Treating RN: [name], 1950 (72 y.o. M) Primary Care : [name] Other Clinician: Referring : Treating /Extender: [name] in Treatment: 95 Vital Signs Height(in): 74 Pulse(bpm): 76 Weight(lbs): 238 Blood Pressure(mmHg): 149/68 Body Mass Index(BMI): 30.6 Temperature(F): 98.5 Respiratory Rate(breaths/min): 18 [18R:Photos:] [N/A:N/A] Left, Plantar Metatarsal head first Left, Proximal, Lateral Lower Leg N/A Wound Location: Gradually Appeared Bump N/A Wounding Event: Diabetic Wound/Ulcer of the Lower Cyst N/A Primary Etiology: Extremity Glaucoma, Sleep Apnea, Glaucoma, Sleep Apnea, N/A Comorbid History: Hypertension, Peripheral Arterial Hypertension, Peripheral Arterial Disease, Peripheral Venous Disease,Disease, Peripheral Venous Disease, Type II Diabetes, Gout, Osteoarthritis, Type II Diabetes, Gout, Osteoarthritis, Neuropathy Neuropathy 08/23/2020 06/03/2021 N/A Date Acquired: 95 87  N/A Weeks of Treatment: Open Open N/A Wound Status: Yes Yes N/A Wound Recurrence: No Yes N/A Clustered Wound: N/A 3 N/A Clustered Quantity: 1.7x1.8x0.3 0.5x0.3x0.1 N/A Measurements L x W x D (cm) 2.403 0.118 N/A A (cm) : rea 0.721 0.012 N/A Volume (cm) : 83.00% 92.80% N/A % Reduction in A rea: 74.50% 99.10% N/A % Reduction in Volume: Grade 2 Full Thickness With Exposed Support N/A Classification: Structures Medium Medium N/A Exudate A mount: Serosanguineous Serous N/A Exudate Type: red, brown amber N/A Exudate Color: Thickened Fibrotic scar, thickened scar N/A Wound Margin: Large (67-100%) Large (67-100%) N/A Granulation A mount: Red Red, Pink N/A Granulation Quality: Small (1-33%) Small (1-33%) N/A Necrotic A mount: Eschar, Adherent Slough Adherent Slough N/A Necrotic Tissue: Fat Layer (Subcutaneous Tissue): Yes Fat Layer (Subcutaneous Tissue): Yes N/A Exposed Structures: Fascia: No Fascia: No Tendon: No Tendon: No Muscle: No Muscle: No Joint: No Joint: No Bone: No Bone: No Small (1-33%) Large (67-100%) N/A Epithelialization: Debridement - Selective/Open Wound Debridement - Selective/Open Wound N/A Debridement: Pre-procedure Verification/Time Out 10:18 10:18 N/A Taken: Lidocaine 5% topical ointment Lidocaine 5% topical ointment N/A Pain Control: Ambulance person, Slough N/A Tissue Debrided: Skin/Epidermis Non-Viable Tissue N/A Level: 2.4 0.12 N/A Debridement A (sq cm): rea Curette Curette N/A Instrument: Minimum Minimum N/A Bleeding: Pressure Pressure N/A Hemostasis A chievedGlynn [name] ([id number]) [id number] Page 4 of 9 Procedure was tolerated well Procedure was tolerated well N/A Debridement Treatment Response: 1.7x1.8x0.3 0.5x0.3x0.1 N/A Post Debridement Measurements L x W x D (cm) 0.721 0.012 N/A Post Debridement Volume: (cm) Callus: Yes Scarring: Yes N/A Periwound Skin Texture: Maceration:  Yes Dry/Scaly: Yes N/A Periwound Skin Moisture: Dry/Scaly: No No Abnormalities Noted Hemosiderin Staining: Yes N/A Periwound Skin Color: No Abnormality No Abnormality N/A Temperature: Chemical Cauterization Compression Therapy N/A Procedures Performed: Debridement Debridement Treatment Notes Electronic Signature(s) Signed: 02/07/2023 10:25:54 AM By: [name] MD FACS Entered By: [name] on 02/07/2023 10:25:54 -------------------------------------------------------------------------------- Multi-Disciplinary Care Plan Details Patient Name: Date of Service: [name]. 02/07/2023 10:00 A M Medical Record Number: [id number] Patient Account Number: [id number] Date of Birth/Sex: Treating RN: 12-Nov-1950 (72 y.o. [name] Primary Care : [name] Other Clinician: Referring : Treating /Extender: [name] in Treatment: 95 Multidisciplinary Care Plan reviewed with physician Active Inactive Venous Leg Ulcer Nursing Diagnoses: Knowledge deficit related to disease process and management Potential for venous Insuffiency (use before diagnosis confirmed) Goals: Patient will maintain optimal edema control Date Initiated: 07/27/2021 Target Resolution Date: 02/23/2023 Goal Status: Active Interventions: Assess peripheral edema status every visit. Treatment Activities: Therapeutic compression applied : 07/27/2021 Notes: Wound/Skin Impairment Nursing Diagnoses: Impaired tissue integrity Knowledge deficit related to ulceration/compromised skin integrity Goals: Patient will have a decrease in wound volume by X% from date: (  specify in notes) Date Initiated: 04/12/2021 Date Inactivated: 01/04/2022 Target Resolution Date: 04/23/2021 Goal Status: Met Patient/caregiver will verbalize understanding of skin care regimen Date Initiated: 01/04/2022 Target Resolution Date: 02/23/2023 Goal Status: Active Ulcer/skin  breakdown will have a volume reduction of 30% by week 4 Date Initiated: 04/12/2021 Date Inactivated: 04/27/2021 Target Resolution Date: 04/27/2021 Goal Status: Unmet Unmet Reason: infection Ulcer/skin breakdown will have a volume reduction of 50% by week 8 [name], [name] ([id number]) [id number] Page 5 of 9 Date Initiated: 04/27/2021 Date Inactivated: 06/29/2021 Target Resolution Date: 06/24/2021 Goal Status: Met Interventions: Assess patient/caregiver ability to obtain necessary supplies Assess patient/caregiver ability to perform ulcer/skin care regimen upon admission and as needed Assess ulceration(s) every visit Notes: Electronic Signature(s) Signed: 02/07/2023 3:56:49 PM By: [name] Entered By: [name] on 02/07/2023 10:21:19 -------------------------------------------------------------------------------- Pain Assessment Details Patient Name: Date of Service: [name]. 02/07/2023 10:00 A M Medical Record Number: [id number] Patient Account Number: [id number] Date of Birth/Sex: Treating RN: Jun 13, 1951 (72 y.o. [name] Primary Care : [name] Other Clinician: Referring : Treating /Extender: [name] in Treatment: 95 Active Problems Location of Pain Severity and Description of Pain Patient Has Paino No Site Locations Rate the pain. Current Pain Level: 0 Pain Management and Medication Current Pain Management: Electronic Signature(s) Signed: 02/07/2023 3:56:49 PM By: [name] Entered By: [name] on 02/07/2023 10:04:46 -------------------------------------------------------------------------------- Patient/Caregiver Education Details Patient Name: Date of Service: [name], [name] 8/15/2024andnbsp10:00 A M Medical Record Number: [id number] Patient Account Number: [id number] [name], [name] ([id number]) [phone].pdf  Page 6 of 9 Date of Birth/Gender: Treating RN: 07/07/1950 (72 y.o. [name] Primary Care Physician: [name] Other Clinician: Referring Physician: Treating Physician/Extender: [name] in Treatment: [phone] Education Assessment Education Provided To: Patient Education Topics Provided Wound/Skin Impairment: Methods: Explain/Verbal Responses: Reinforcements needed, State content correctly Electronic Signature(s) Signed: 02/07/2023 3:56:49 PM By: [name] Entered By: [name] on 02/07/2023 10:21:32 -------------------------------------------------------------------------------- Wound Assessment Details Patient Name: Date of Service: [name]. 02/07/2023 10:00 A M Medical Record Number: [id number] Patient Account Number: [id number] Date of Birth/Sex: Treating RN: 1950-09-13 (72 y.o. [name] Primary Care : [name] Other Clinician: Referring : Treating /Extender: [name] in Treatment: 95 Wound Status Wound Number: [id number] Primary Diabetic Wound/Ulcer of the Lower Extremity Etiology: Wound Location: Left, Plantar Metatarsal head first Wound Open Wounding Event: Gradually Appeared Status: Date Acquired: 08/23/2020 Comorbid Glaucoma, Sleep Apnea, Hypertension, Peripheral Arterial Disease, Weeks Of Treatment: 95 History: Peripheral Venous Disease, Type II Diabetes, Gout, Osteoarthritis, Clustered Wound: No Neuropathy Photos Wound Measurements Length: (cm) 1.7 Width: (cm) 1.8 Depth: (cm) 0.3 Area: (cm) 2.403 Volume: (cm) 0.721 % Reduction in Area: 83% % Reduction in Volume: 74.5% Epithelialization: Small (1-33%) Tunneling: No Undermining: No Wound Description Classification: Grade 2 Wound Margin: Thickened Exudate Amount: Medium Exudate Type: Serosanguineous Exudate Color: red, brown [name], [name] ([id number]) Foul Odor After Cleansing:  No Slough/Fibrino Yes [id number] Page 7 of 9 Wound Bed Granulation Amount: Large (67-100%) Exposed Structure Granulation Quality: Red Fascia Exposed: No Necrotic Amount: Small (1-33%) Fat Layer (Subcutaneous Tissue) Exposed: Yes Necrotic Quality: Eschar, Adherent Slough Tendon Exposed: No Muscle Exposed: No Joint Exposed: No Bone Exposed: No Periwound Skin Texture Texture Color No Abnormalities Noted: No No Abnormalities Noted: Yes Callus: Yes Temperature / Pain Temperature: No Abnormality Moisture No Abnormalities Noted: No Dry / Scaly: No Maceration: Yes Treatment Notes Wound #18R (Metatarsal head first) Wound  Laterality: Plantar, Left Cleanser Soap and Water Discharge Instruction: May shower and wash wound with dial antibacterial soap and water prior to dressing change. Wound Cleanser Discharge Instruction: Cleanse the wound with wound cleanser prior to applying a clean dressing using gauze sponges, not tissue or cotton balls. Peri-Wound Care Ketoconazole Cream 2% Discharge Instruction: Apply Ketoconazole as directed Triamcinolone 15 (g) Discharge Instruction: Use triamcinolone 15 (g) as directed Topical Gentamicin Discharge Instruction: As directed by physician Mupirocin Ointment Discharge Instruction: Apply Mupirocin (Bactroban) as instructed Primary Dressing Maxorb Extra Ag+ Alginate Dressing, 2x2 (in/in) Discharge Instruction: Apply to wound bed as instructed Secondary Dressing Optifoam Non-Adhesive Dressing, 4x4 in Discharge Instruction: Apply over primary dressing as directed. Woven Gauze Sponges 2x2 in Discharge Instruction: Apply over primary dressing as directed. Zetuvit Absorbent Pad, 4x4 (in/in) Secured With Compression Wrap Urgo K2, (equivalent to a 4 layer) two layer compression system, regular Discharge Instruction: Apply Urgo K2 as directed (alternative to 4 layer compression). Compression  Stockings Add-Ons Electronic Signature(s) Signed: 02/07/2023 3:56:49 PM By: [name] Entered By: [name] on 02/07/2023 10:13:44 [name], [name] ([id number]) [id number] Page 8 of 9 -------------------------------------------------------------------------------- Wound Assessment Details Patient Name: Date of Service: [name], [name] 02/07/2023 10:00 A M Medical Record Number: [id number] Patient Account Number: [id number] Date of Birth/Sex: Treating RN: 1951-05-15 (73 y.o. [name] Primary Care : [name] Other Clinician: Referring : Treating /Extender: [name] in Treatment: 95 Wound Status Wound Number: 22R Primary Cyst Etiology: Wound Location: Left, Proximal, Lateral Lower Leg Wound Open Wounding Event: Bump Status: Date Acquired: 06/03/2021 Comorbid Glaucoma, Sleep Apnea, Hypertension, Peripheral Arterial Disease, Weeks Of Treatment: 87 History: Peripheral Venous Disease, Type II Diabetes, Gout, Osteoarthritis, Clustered Wound: Yes Neuropathy Photos Wound Measurements Length: (cm) Width: (cm) Depth: (cm) Clustered Quantity: Area: (cm) Volume: (cm) 0.5 % Reduction in Area: 92.8% 0.3 % Reduction in Volume: 99.1% 0.1 Epithelialization: Large (67-100%) 3 Tunneling: No 0.118 Undermining: No 0.012 Wound Description Classification: Full Thickness With Exposed Suppo Wound Margin: Fibrotic scar, thickened scar Exudate Amount: Medium Exudate Type: Serous Exudate Color: amber rt Structures Foul Odor After Cleansing: No Slough/Fibrino Yes Wound Bed Granulation Amount: Large (67-100%) Exposed Structure Granulation Quality: Red, Pink Fascia Exposed: No Necrotic Amount: Small (1-33%) Fat Layer (Subcutaneous Tissue) Exposed: Yes Necrotic Quality: Adherent Slough Tendon Exposed: No Muscle Exposed: No Joint Exposed: No Bone Exposed: No Periwound Skin  Texture Texture Color No Abnormalities Noted: No No Abnormalities Noted: No Scarring: Yes Hemosiderin Staining: Yes Moisture Temperature / Pain No Abnormalities Noted: Yes Temperature: No Abnormality Treatment Notes Wound #22R (Lower Leg) Wound Laterality: Left, Lateral, Proximal Mercer Pod ([id number]) [id number] Page 9 of 9 Soap and Water Discharge Instruction: May shower and wash wound with dial antibacterial soap and water prior to dressing change. Wound Cleanser Discharge Instruction: Cleanse the wound with wound cleanser prior to applying a clean dressing using gauze sponges, not tissue or cotton balls. Peri-Wound Care Topical Primary Dressing Endoform 2x2 in Discharge Instruction: Moisten with saline Secondary Dressing Woven Gauze Sponge, Non-Sterile 4x4 in Discharge Instruction: Apply over primary dressing as directed. Secured With Elastic Bandage 4 inch (ACE bandage) Discharge Instruction: Secure with ACE bandage as directed. Kerlix Roll Sterile, 4.5x3.1 (in/yd) Discharge Instruction: Secure with Kerlix as directed. Compression Wrap Compression Stockings Add-Ons Electronic Signature(s) Signed: 02/07/2023 3:56:49 PM By: [name] Entered By: [name] on 02/07/2023 10:14:12 -------------------------------------------------------------------------------- Vitals Details Patient Name: Date of Service: [name]. 02/07/2023 10:00 A M Medical Record  Number: [id number] Patient Account Number: [id number] Date of Birth/Sex: Treating RN: 25-Aug-1950 (72 y.o. [name] Primary Care : [name] Other Clinician: Referring : Treating /Extender: [name] in Treatment: 95 Vital Signs Time Taken: 10:04 Temperature (F): 98.5 Height (in): 74 Pulse (bpm): 76 Weight (lbs): 238 Respiratory Rate (breaths/min): 18 Body Mass Index (BMI): 30.6 Blood  Pressure (mmHg): 149/68 Reference Range: 80 - 120 mg / dl Electronic Signature(s) Signed: 02/07/2023 3:56:49 PM By: [name] Entered By: [name] on 02/07/2023 10:04:37

## 2023-02-09 ENCOUNTER — Other Ambulatory Visit: Payer: Self-pay | Admitting: Nurse Practitioner

## 2023-02-14 ENCOUNTER — Encounter (HOSPITAL_BASED_OUTPATIENT_CLINIC_OR_DEPARTMENT_OTHER): Payer: Medicare Other | Admitting: General Surgery

## 2023-02-14 DIAGNOSIS — E11621 Type 2 diabetes mellitus with foot ulcer: Secondary | ICD-10-CM | POA: Diagnosis not present

## 2023-02-14 NOTE — Progress Notes (Signed)
[name], [name] ([id number]) [id number] Page 1 of 10 Visit Report for 02/14/2023 Arrival Information Details Patient Name: Date of Service: [name], [name] 02/14/2023 10:00 A M Medical Record Number: [id number] Patient Account Number: [id number] Date of Birth/Sex: Treating RN: [birth year] (72 y.o. [name] Primary Care [name]: [name] Other Clinician: Referring Aanika Defoor: Treating [name]: [name] in Treatment: 96 Visit Information History Since Last Visit Added or deleted any medications: No Patient Arrived: Ambulatory Any new allergies or adverse reactions: No Arrival Time: 09:57 Had a fall or experienced change in No Accompanied By: self activities of daily living that may affect Transfer Assistance: None risk of falls: Patient Identification Verified: Yes Signs or symptoms of abuse/neglect since last visito No Secondary Verification Process Completed: Yes Hospitalized since last visit: No Patient Requires Transmission-Based Precautions: No Implantable device outside of the clinic excluding No Patient Has Alerts: Yes cellular tissue based products placed in the center since last visit: Has Dressing in Place as Prescribed: Yes Has Compression in Place as Prescribed: Yes Pain Present Now: No Electronic Signature(s) Signed: 02/14/2023 3:57:51 PM By: [name] Entered By: [name] on 02/14/2023 06:59:39 -------------------------------------------------------------------------------- Compression Therapy Details Patient Name: Date of Service: [name]. 02/14/2023 10:00 A M Medical Record Number: [id number] Patient Account Number: [id number] Date of Birth/Sex: Treating RN: 1951-05-23 (72 y.o. [name] Primary Care Yanice Maqueda: [name] Other Clinician: Referring Tiffany Calmes: Treating [name]/Extender: [name] in Treatment:  28 Compression Therapy Performed for Wound Assessment: Wound #18R Left,Plantar Metatarsal head first Performed By: Clinician [name] Bruin, RN Compression Type: Double Layer Post Procedure Diagnosis Same as Pre-procedure Electronic Signature(s) Signed: 02/14/2023 3:57:51 PM By: [name] Entered By: [name] on 02/14/2023 07:15:51 [name], [name] ([id number]) [id number] Page 2 of 10 -------------------------------------------------------------------------------- Encounter Discharge Information Details Patient Name: Date of Service: [name], [name] 02/14/2023 10:00 A M Medical Record Number: [id number] Patient Account Number: [id number] Date of Birth/Sex: Treating RN: 05-02-1951 (72 y.o. [name] Primary Care Eswin Worrell: [name] Other Clinician: Referring Selenia Mihok: Treating Geonna Lockyer/Extender: [name] in Treatment: 96 Encounter Discharge Information Items Post Procedure Vitals Discharge Condition: Stable Temperature (F): 98.6 Ambulatory Status: Ambulatory Pulse (bpm): 71 Discharge Destination: Home Respiratory Rate (breaths/min): 18 Transportation: Private Auto Blood Pressure (mmHg): 150/79 Accompanied By: self Schedule Follow-up Appointment: Yes Clinical Summary of Care: Patient Declined Electronic Signature(s) Signed: 02/14/2023 3:57:51 PM By: [name] Entered By: [name] on 02/14/2023 07:33:12 -------------------------------------------------------------------------------- Lower Extremity Assessment Details Patient Name: Date of Service: [name]. 02/14/2023 10:00 A M Medical Record Number: [id number] Patient Account Number: [id number] Date of Birth/Sex: Treating RN: May 19, 1951 (72 y.o. [name] Primary Care Liliani Bobo: [name] Other Clinician: Referring Nathanyel Defenbaugh: Treating [name]/Extender: [name] in Treatment:  96 Edema Assessment Assessed: [name]: No] [name]: No] Edema: [Left: Ye] [Right: s] Calf Left: Right: Point of Measurement: 41 cm From Medial Instep 44 cm Ankle Left: Right: Point of Measurement: 10 cm From Medial Instep 26 cm Vascular Assessment Pulses: Dorsalis Pedis Palpable: [Left:Yes] Extremity colors, hair growth, and conditions: Hair Growth on Extremity: [Left:No] Temperature of Extremity: [Left:Warm] Capillary Refill: [Left:< 3 seconds] Dependent Rubor: [Left:No No] Electronic Signature(s) Signed: 02/14/2023 3:57:51 PM By: [name] Entered By: [name] on 02/14/2023 07:06:16 [name] ([id number]) [id number] Page 3 of 10 -------------------------------------------------------------------------------- Multi Wound Chart Details Patient Name: Date of Service: [name], [name] 02/14/2023 10:00 A M Medical Record Number:  [id number] Patient Account Number: [id number] Date of Birth/Sex: Treating RN: 06-01-1951 (72 y.o. M) Primary Care Nitisha Civello: [name] Other Clinician: Referring Tinlee Navarrette: Treating Lanetta Figuero/Extender: [name] in Treatment: 96 Vital Signs Height(in): 74 Pulse(bpm): 71 Weight(lbs): 238 Blood Pressure(mmHg): 150/79 Body Mass Index(BMI): 30.6 Temperature(F): 98.6 Respiratory Rate(breaths/min): 18 [18R:Photos:] [N/A:N/A] Left, Plantar Metatarsal head first Left, Proximal, Lateral Lower Leg N/A Wound Location: Gradually Appeared Bump N/A Wounding Event: Diabetic Wound/Ulcer of the Lower Cyst N/A Primary Etiology: Extremity Glaucoma, Sleep Apnea, Glaucoma, Sleep Apnea, N/A Comorbid History: Hypertension, Peripheral Arterial Hypertension, Peripheral Arterial Disease, Peripheral Venous Disease,Disease, Peripheral Venous Disease, Type II Diabetes, Gout, Osteoarthritis, Type II Diabetes, Gout, Osteoarthritis, Neuropathy Neuropathy 08/23/2020 06/03/2021 N/A Date Acquired: 96 88  N/A Weeks of Treatment: Open Open N/A Wound Status: Yes Yes N/A Wound Recurrence: No Yes N/A Clustered Wound: N/A 3 N/A Clustered Quantity: 1.9x2x0.1 0.1x0.1x0.1 N/A Measurements L x W x D (cm) 2.985 0.008 N/A A (cm) : rea 0.298 0.001 N/A Volume (cm) : 78.90% 99.50% N/A % Reduction in A rea: 89.50% 99.90% N/A % Reduction in Volume: Grade 2 Full Thickness With Exposed Support N/A Classification: Structures Medium None Present N/A Exudate A mount: Serosanguineous N/A N/A Exudate Type: red, brown N/A N/A Exudate Color: Thickened Fibrotic scar, thickened scar N/A Wound Margin: Large (67-100%) None Present (0%) N/A Granulation A mount: Red N/A N/A Granulation Quality: Small (1-33%) Large (67-100%) N/A Necrotic A mount: Eschar, Adherent Slough Eschar N/A Necrotic Tissue: Fat Layer (Subcutaneous Tissue): Yes Fascia: No N/A Exposed Structures: Fascia: No Fat Layer (Subcutaneous Tissue): No Tendon: No Tendon: No Muscle: No Muscle: No Joint: No Joint: No Bone: No Bone: No Small (1-33%) Large (67-100%) N/A Epithelialization: Debridement - Selective/Open Wound Debridement - Selective/Open Wound N/A Debridement: Pre-procedure Verification/Time Out 10:15 10:15 N/A Taken: Lidocaine 4% T opical Solution Lidocaine 4% Topical Solution N/A Pain Control: Callus, Slough Necrotic/Eschar N/A Tissue Debrided: Non-Viable Tissue Non-Viable Tissue N/A Level: 2.98 0.02 N/A Debridement A (sq cm): rea Curette Curette N/A Instrument: Minimum Minimum N/A Bleeding: Pressure Pressure N/A Hemostasis A chievedGlynn [name] ([id number]) [id number] Page 4 of 10 Procedure was tolerated well Procedure was tolerated well N/A Debridement Treatment Response: 1.9x2x0.1 0.1x0.1x0.1 N/A Post Debridement Measurements L x W x D (cm) 0.298 0.001 N/A Post Debridement Volume: (cm) Callus: Yes Scarring: Yes N/A Periwound Skin Texture: Maceration:  Yes Dry/Scaly: Yes N/A Periwound Skin Moisture: Dry/Scaly: No No Abnormalities Noted Hemosiderin Staining: Yes N/A Periwound Skin Color: No Abnormality No Abnormality N/A Temperature: Compression Therapy Debridement N/A Procedures Performed: Debridement Treatment Notes Wound #18R (Metatarsal head first) Wound Laterality: Plantar, Left Cleanser Soap and Water Discharge Instruction: May shower and wash wound with dial antibacterial soap and water prior to dressing change. Wound Cleanser Discharge Instruction: Cleanse the wound with wound cleanser prior to applying a clean dressing using gauze sponges, not tissue or cotton balls. Peri-Wound Care Ketoconazole Cream 2% Discharge Instruction: Apply Ketoconazole as directed Triamcinolone 15 (g) Discharge Instruction: Use triamcinolone 15 (g) as directed Topical Gentamicin Discharge Instruction: As directed by physician Mupirocin Ointment Discharge Instruction: Apply Mupirocin (Bactroban) as instructed Primary Dressing Maxorb Extra Ag+ Alginate Dressing, 2x2 (in/in) Discharge Instruction: Apply to wound bed as instructed Secondary Dressing Optifoam Non-Adhesive Dressing, 4x4 in Discharge Instruction: Apply over primary dressing as directed. Woven Gauze Sponges 2x2 in Discharge Instruction: Apply over primary dressing as directed. Zetuvit Absorbent Pad, 4x4 (in/in) Secured With Compression Wrap Urgo K2, (equivalent to a 4 layer) two layer compression system, regular Discharge Instruction:  Apply Urgo K2 as directed (alternative to 4 layer compression). Compression Stockings Add-Ons Wound #22R (Lower Leg) Wound Laterality: Left, Lateral, Proximal Cleanser Soap and Water Discharge Instruction: May shower and wash wound with dial antibacterial soap and water prior to dressing change. Wound Cleanser Discharge Instruction: Cleanse the wound with wound cleanser prior to applying a clean dressing using gauze sponges, not tissue or  cotton balls. Peri-Wound Care Topical Primary Dressing Endoform 2x2 in Discharge Instruction: Moisten with saline Secondary Dressing Woven Gauze Sponge, Non-Sterile 4x4 in Discharge Instruction: Apply over primary dressing as directed. [name], [name] ([id number]) [id number] Page 5 of 10 Secured With Elastic Bandage 4 inch (ACE bandage) Discharge Instruction: Secure with ACE bandage as directed. Kerlix Roll Sterile, 4.5x3.1 (in/yd) Discharge Instruction: Secure with Kerlix as directed. Compression Wrap Compression Stockings Add-Ons Electronic Signature(s) Signed: 02/14/2023 11:02:49 AM By: [name] MD FACS Entered By: [name] on 02/14/2023 08:02:49 -------------------------------------------------------------------------------- Multi-Disciplinary Care Plan Details Patient Name: Date of Service: [name]. 02/14/2023 10:00 A M Medical Record Number: [id number] Patient Account Number: [id number] Date of Birth/Sex: Treating RN: 20-Jan-1951 (72 y.o. [name] Primary Care Vinod Mikesell: [name] Other Clinician: Referring Anyah Swallow: Treating Imogine Carvell/Extender: [name] in Treatment: 96 Multidisciplinary Care Plan reviewed with physician Active Inactive Venous Leg Ulcer Nursing Diagnoses: Knowledge deficit related to disease process and management Potential for venous Insuffiency (use before diagnosis confirmed) Goals: Patient will maintain optimal edema control Date Initiated: 07/27/2021 Target Resolution Date: 03/22/2023 Goal Status: Active Interventions: Assess peripheral edema status every visit. Treatment Activities: Therapeutic compression applied : 07/27/2021 Notes: Wound/Skin Impairment Nursing Diagnoses: Impaired tissue integrity Knowledge deficit related to ulceration/compromised skin integrity Goals: Patient will have a decrease in wound volume by X% from date: (specify in  notes) Date Initiated: 04/12/2021 Date Inactivated: 01/04/2022 Target Resolution Date: 04/23/2021 Goal Status: Met Patient/caregiver will verbalize understanding of skin care regimen Date Initiated: 01/04/2022 Target Resolution Date: 03/22/2023 Goal Status: Active Ulcer/skin breakdown will have a volume reduction of 30% by week 4 Date Initiated: 04/12/2021 Date Inactivated: 04/27/2021 Target Resolution Date: 04/27/2021 Goal Status: Unmet Unmet Reason: infection Ulcer/skin breakdown will have a volume reduction of 50% by week 8 [name] ([id number]) [id number] Page 6 of 10 Date Initiated: 04/27/2021 Date Inactivated: 06/29/2021 Target Resolution Date: 06/24/2021 Goal Status: Met Interventions: Assess patient/caregiver ability to obtain necessary supplies Assess patient/caregiver ability to perform ulcer/skin care regimen upon admission and as needed Assess ulceration(s) every visit Notes: Electronic Signature(s) Signed: 02/14/2023 3:57:51 PM By: [name] Entered By: [name] on 02/14/2023 07:31:35 -------------------------------------------------------------------------------- Pain Assessment Details Patient Name: Date of Service: [name]. 02/14/2023 10:00 A M Medical Record Number: [id number] Patient Account Number: [id number] Date of Birth/Sex: Treating RN: 1951/06/07 (72 y.o. [name] Primary Care Areej Tayler: [name] Other Clinician: Referring Pasqualina Colasurdo: Treating Renisha Cockrum/Extender: [name] in Treatment: 96 Active Problems Location of Pain Severity and Description of Pain Patient Has Paino No Site Locations Rate the pain. Current Pain Level: 0 Pain Management and Medication Current Pain Management: Electronic Signature(s) Signed: 02/14/2023 3:57:51 PM By: [name] Entered By: [name] on 02/14/2023  06:59:47 -------------------------------------------------------------------------------- Patient/Caregiver Education Details Patient Name: Date of Service: [name], [name] 8/22/2024andnbsp10:00 A M Medical Record Number: [id number] Patient Account Number: [id number] [name], [name] ([id number]) [phone].pdf Page 7 of 10 Date of Birth/Gender: Treating RN: 1951/02/24 (72 y.o. [name] Primary Care Physician: [name] Other Clinician: Referring Physician: Treating Physician/Extender: Mosie Lukes,  Launa Grill in Treatment: 96 Education Assessment Education Provided To: Patient Education Topics Provided Wound/Skin Impairment: Methods: Explain/Verbal Responses: Reinforcements needed, State content correctly [employer]) Signed: 02/14/2023 3:57:51 PM By: [name] Entered By: [name] on 02/14/2023 07:32:20 -------------------------------------------------------------------------------- Wound Assessment Details Patient Name: Date of Service: [name]. 02/14/2023 10:00 A M Medical Record Number: [id number] Patient Account Number: [id number] Date of Birth/Sex: Treating RN: 12-26-1950 (72 y.o. [name] Primary Care Mariam Helbert: [name] Other Clinician: Referring Andersen Iorio: Treating Irie Fiorello/Extender: [name] in Treatment: 96 Wound Status Wound Number: 18R Primary Diabetic Wound/Ulcer of the Lower Extremity Etiology: Wound Location: Left, Plantar Metatarsal head first Wound Open Wounding Event: Gradually Appeared Status: Date Acquired: 08/23/2020 Comorbid Glaucoma, Sleep Apnea, Hypertension, Peripheral Arterial Disease, Weeks Of Treatment: 96 History: Peripheral Venous Disease, Type II Diabetes, Gout, Osteoarthritis, Clustered Wound: No Neuropathy Photos Wound Measurements Length: (cm) 1.9 Width: (cm) 2 Depth: (cm) 0.1 Area: (cm) 2.985 Volume: (cm)  0.298 % Reduction in Area: 78.9% % Reduction in Volume: 89.5% Epithelialization: Small (1-33%) Tunneling: No Undermining: No Wound Description Classification: Grade 2 Wound Margin: Thickened Exudate Amount: Medium Exudate Type: Serosanguineous Exudate Color: red, brown [name], [name] ([id number]) Foul Odor After Cleansing: No Slough/Fibrino Yes [id number] Page 8 of 10 Wound Bed Granulation Amount: Large (67-100%) Exposed Structure Granulation Quality: Red Fascia Exposed: No Necrotic Amount: Small (1-33%) Fat Layer (Subcutaneous Tissue) Exposed: Yes Necrotic Quality: Eschar, Adherent Slough Tendon Exposed: No Muscle Exposed: No Joint Exposed: No Bone Exposed: No Periwound Skin Texture Texture Color No Abnormalities Noted: No No Abnormalities Noted: Yes Callus: Yes Temperature / Pain Temperature: No Abnormality Moisture No Abnormalities Noted: No Dry / Scaly: No Maceration: Yes Treatment Notes Wound #18R (Metatarsal head first) Wound Laterality: Plantar, Left Cleanser Soap and Water Discharge Instruction: May shower and wash wound with dial antibacterial soap and water prior to dressing change. Wound Cleanser Discharge Instruction: Cleanse the wound with wound cleanser prior to applying a clean dressing using gauze sponges, not tissue or cotton balls. Peri-Wound Care Ketoconazole Cream 2% Discharge Instruction: Apply Ketoconazole as directed Triamcinolone 15 (g) Discharge Instruction: Use triamcinolone 15 (g) as directed Topical Gentamicin Discharge Instruction: As directed by physician Mupirocin Ointment Discharge Instruction: Apply Mupirocin (Bactroban) as instructed Primary Dressing Maxorb Extra Ag+ Alginate Dressing, 2x2 (in/in) Discharge Instruction: Apply to wound bed as instructed Secondary Dressing Optifoam Non-Adhesive Dressing, 4x4 in Discharge Instruction: Apply over primary dressing as directed. Woven Gauze Sponges 2x2  in Discharge Instruction: Apply over primary dressing as directed. Zetuvit Absorbent Pad, 4x4 (in/in) Secured With Compression Wrap Urgo K2, (equivalent to a 4 layer) two layer compression system, regular Discharge Instruction: Apply Urgo K2 as directed (alternative to 4 layer compression). Compression Stockings Add-Ons Electronic Signature(s) Signed: 02/14/2023 3:57:51 PM By: [name] Entered By: [name] on 02/14/2023 07:10:09 [name] ([id number]) [id number] Page 9 of 10 -------------------------------------------------------------------------------- Wound Assessment Details Patient Name: Date of Service: [name], [name] 02/14/2023 10:00 A M Medical Record Number: [id number] Patient Account Number: [id number] Date of Birth/Sex: Treating RN: 1951-01-21 (72 y.o. [name] Primary Care Shekera Beavers: [name] Other Clinician: Referring Gayle Collard: Treating Sandip Power/Extender: [name] in Treatment: 96 Wound Status Wound Number: 22R Primary Cyst Etiology: Wound Location: Left, Proximal, Lateral Lower Leg Wound Open Wounding Event: Bump Status: Date Acquired: 06/03/2021 Comorbid Glaucoma, Sleep Apnea, Hypertension, Peripheral Arterial Disease, Weeks Of Treatment: 88 History: Peripheral Venous Disease, Type II Diabetes, Gout, Osteoarthritis, Clustered Wound: Yes Neuropathy Photos  Wound Measurements Length: (cm) Width: (cm) Depth: (cm) Clustered Quantity: Area: (cm) Volume: (cm) 0.1 % Reduction in Area: 99.5% 0.1 % Reduction in Volume: 99.9% 0.1 Epithelialization: Large (67-100%) 3 Tunneling: No 0.008 Undermining: No 0.001 Wound Description Classification: Full Thickness With Exposed Support Structures Wound Margin: Fibrotic scar, thickened scar Exudate Amount: None Present Foul Odor After Cleansing: No Slough/Fibrino No Wound Bed Granulation Amount: None Present (0%) Exposed  Structure Necrotic Amount: Large (67-100%) Fascia Exposed: No Necrotic Quality: Eschar Fat Layer (Subcutaneous Tissue) Exposed: No Tendon Exposed: No Muscle Exposed: No Joint Exposed: No Bone Exposed: No Periwound Skin Texture Texture Color No Abnormalities Noted: No No Abnormalities Noted: No Scarring: Yes Hemosiderin Staining: Yes Moisture Temperature / Pain No Abnormalities Noted: Yes Temperature: No Abnormality Treatment Notes Wound #22R (Lower Leg) Wound Laterality: Left, Lateral, Proximal Cleanser Soap and Water Discharge Instruction: May shower and wash wound with dial antibacterial soap and water prior to dressing change. [name], [name] ([id number]) [id number] Page 10 of 10 Wound Cleanser Discharge Instruction: Cleanse the wound with wound cleanser prior to applying a clean dressing using gauze sponges, not tissue or cotton balls. Peri-Wound Care Topical Primary Dressing Endoform 2x2 in Discharge Instruction: Moisten with saline Secondary Dressing Woven Gauze Sponge, Non-Sterile 4x4 in Discharge Instruction: Apply over primary dressing as directed. Secured With Elastic Bandage 4 inch (ACE bandage) Discharge Instruction: Secure with ACE bandage as directed. Kerlix Roll Sterile, 4.5x3.1 (in/yd) Discharge Instruction: Secure with Kerlix as directed. Compression Wrap Compression Stockings Add-Ons Electronic Signature(s) Signed: 02/14/2023 3:57:51 PM By: [name] Entered By: [name] on 02/14/2023 07:11:32 -------------------------------------------------------------------------------- Vitals Details Patient Name: Date of Service: [name]. 02/14/2023 10:00 A M Medical Record Number: [id number] Patient Account Number: [id number] Date of Birth/Sex: Treating RN: 13-Aug-1950 (72 y.o. [name] Primary Care Briani Maul: [name] Other Clinician: Referring Julyanna Scholle: Treating Amous Crewe/Extender: [name] in Treatment: 96 Vital Signs Time Taken: 10:01 Temperature (F): 98.6 Height (in): 74 Pulse (bpm): 71 Weight (lbs): 238 Respiratory Rate (breaths/min): 18 Body Mass Index (BMI): 30.6 Blood Pressure (mmHg): 150/79 Reference Range: 80 - 120 mg / dl Electronic Signature(s) Signed: 02/14/2023 3:57:51 PM By: [name] Entered By: [name] on 02/14/2023 07:01:21

## 2023-02-14 NOTE — Progress Notes (Signed)
[name] ([id number]) [id number] Page 1 of 22 Visit Report for 02/14/2023 Chief Complaint Document Details Patient Name: Date of Service: [name], [name] 02/14/2023 10:00 A M Medical Record Number: [id number] Patient Account Number: [id number] Date of Birth/Sex: Treating RN: 1951/03/23 (72 y.o. M) Primary Care Provider: [name] Other Clinician: Referring Provider: Treating Provider/Extender: [name] in Treatment: 96 Information Obtained from: Patient Chief Complaint Left leg and foot ulcers 04/12/2021; patient is here for wounds on his left lower leg and left plantar foot over the first metatarsal head Electronic Signature(s) Signed: 02/14/2023 11:03:02 AM By: [name] MD FACS Entered By: [name] on 02/14/2023 08:03:02 -------------------------------------------------------------------------------- Debridement Details Patient Name: Date of Service: [name]. 02/14/2023 10:00 A M Medical Record Number: [id number] Patient Account Number: [id number] Date of Birth/Sex: Treating RN: [birth year] (72 y.o. [name] Primary Care Provider: [name] Other Clinician: Referring Provider: Treating Provider/Extender: [name] in Treatment: 96 Debridement Performed for Assessment: Wound #22R Left,Proximal,Lateral Lower Leg Performed By: Physician [name], MD Debridement Type: Debridement Level of Consciousness (Pre-procedure): Awake and Alert Pre-procedure Verification/Time Out Yes - 10:15 Taken: Start Time: 10:15 Pain Control: Lidocaine 4% Topical Solution Percent of Wound Bed Debrided: 200% T Area Debrided (cm): otal 0.02 Tissue and other material debrided: Non-Viable, Eschar Level: Non-Viable Tissue Debridement Description: Selective/Open Wound Instrument: Curette Bleeding: Minimum Hemostasis Achieved: Pressure Response to Treatment: Procedure was  tolerated well Level of Consciousness (Post- Awake and Alert procedure): Post Debridement Measurements of Total Wound Length: (cm) 0.1 Width: (cm) 0.1 Depth: (cm) 0.1 Volume: (cm) 0.001 Character of Wound/Ulcer Post Debridement: Improved Post Procedure Diagnosis Same as [name] ([id number]) [id number] Page 2 of 22 Notes scribed for Dr. Lady Gary by [name], RN Electronic Signature(s) Signed: 02/14/2023 12:59:10 PM By: [name] MD FACS Signed: 02/14/2023 3:57:51 PM By: Gelene Mink By: [name] on 02/14/2023 07:16:23 -------------------------------------------------------------------------------- Debridement Details Patient Name: Date of Service: [name]. 02/14/2023 10:00 A M Medical Record Number: [id number] Patient Account Number: [id number] Date of Birth/Sex: Treating RN: 1950-11-13 (72 y.o. [name] Primary Care Provider: [name] Other Clinician: Referring Provider: Treating Provider/Extender: [name] in Treatment: 96 Debridement Performed for Assessment: Wound #18R Left,Plantar Metatarsal head first Performed By: Physician [name], MD Debridement Type: Debridement Severity of Tissue Pre Debridement: Fat layer exposed Level of Consciousness (Pre-procedure): Awake and Alert Pre-procedure Verification/Time Out Yes - 10:15 Taken: Start Time: 10:15 Pain Control: Lidocaine 4% Topical Solution Percent of Wound Bed Debrided: 100% T Area Debrided (cm): otal 2.98 Tissue and other material debrided: Non-Viable, Callus, Slough, Slough Level: Non-Viable Tissue Debridement Description: Selective/Open Wound Instrument: Curette Bleeding: Minimum Hemostasis Achieved: Pressure Response to Treatment: Procedure was tolerated well Level of Consciousness (Post- Awake and Alert procedure): Post Debridement Measurements of Total  Wound Length: (cm) 1.9 Width: (cm) 2 Depth: (cm) 0.1 Volume: (cm) 0.298 Character of Wound/Ulcer Post Debridement: Improved Severity of Tissue Post Debridement: Fat layer exposed Post Procedure Diagnosis Same as Pre-procedure Notes scribed for Dr. Lady Gary by [name], RN Electronic Signature(s) Signed: 02/14/2023 12:59:10 PM By: [name] MD FACS Signed: 02/14/2023 3:57:51 PM By: [name] Entered By: [name] on 02/14/2023 07:19:06 HPI Details -------------------------------------------------------------------------------- [name] ([id number]) [id number] Page 3 of 22 Patient Name: Date of Service: [name] 02/14/2023 10:00 A M Medical Record Number: [id number] Patient Account Number: [id number] Date of Birth/Sex: Treating RN: December 17, 1950 (72 y.o.  M) Primary Care Provider: [name] Other Clinician: Referring Provider: Treating Provider/Extender: [name] in Treatment: 96 History of Present Illness HPI Description: 10/11/17; [name] is a 72 year old man who tells me that in 2015 he slipped down the latter traumatizing his left leg. He developed a wound in the same spot the area that we are currently looking at. He states this closed over for the most part although he always felt it was somewhat unstable. In 2016 he hit the same area with the door of his car had this reopened. He tells me that this is never really closed although sometimes an inflow it remains open on a constant basis. He has not been using any specific dressing to this except for topical antibiotics the nature of which were not really sure. His primary doctor did send him to see Dr. Jacinto Halim of interventional cardiology. He underwent an angiogram on 08/06/17 and he underwent a PTA and directional atherectomy of the lesser distal SFA and popliteal arteries which resulted in brisk improvement in blood flow. It was noted  that he had 2 vessel runoff through the anterior tibial and peroneal. He is also been to see vascular and interventional radiologist. He was not felt to have any significant superficial venous insufficiency. Presumably is not a candidate for any ablation. It was suggested he come here for wound care. The patient is a type II diabetic on insulin. He also has a history of venous insufficiency. ABIs on the left were noncompressible in our clinic 10/21/17; patient we admitted to the clinic last week. 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 We put Iodosorb on him after an aggressive debridement and 3 layer compression. He complained of pain in his ankle and itching with is skin in fact he scratched the area on the medial calf superiorly at the rim of our wraps and he has 2 small open areas in that location today which are new. I changed his primary dressing today to silver collagen. As noted he is already had revascularization and does not have any significant superficial venous insufficiency that would be amenable to ablation 10/28/17; patient admitted to the clinic 2 weeks ago. He has a smaller Wound. Scratch injury from last week revealed. There is large wound over the tibial area. This is smaller. Granulation looks healthy. No need for debridement. 11/04/17; the wound on the left lateral calf looks better. Improved dimensions. Surface of this looks better. We've been maintaining him and Kerlix Coban wraps. He finds this much more comfortable. Silver collagen dressing 11/11/17; left lateral Wound continues to look healthy be making progress. Using a #5 curet I removed removed nonviable skin from the surface of the wound and then necrotic debris from the wound surface. Surface of the wound continues to look healthy. He also has an open area on the left great toenail bed. We've been using topical antibiotics. 11/19/17; left anterior lateral  wound continues to look healthy but it's not closed. He also had a small wound above this on the left leg Initially traumatic wounds in the setting of significant chronic venous insufficiency and stasis dermatitis 11/25/17; left anterior wounds superiorly is closed still a small wound inferiorly. 12/02/17; left anterior tibial area. Arrives today with adherent callus. Post debridement clearly not completely closed. Hydrofera Blue under 3 layer compression. 12/09/17; left anterior tibia. Circumferential eschar however the wound bed looks stable to improved. We've been using Hydrofera  Blue under 3 layer compression 12/17/17; left anterior tibia. Apparently this was felt to be closed however when the wrap was taken off there is a skin tear to reopen wounds in the same area we've been using Hydrofera Blue under 3 layer compression 12/23/17 left anterior tibia. Not close to close this week apparently the [facility] was stuck to this again. Still circumferential eschar requiring debridement. I put a contact layer on this this time under the Hydrofera Blue 12/31/17; left anterior tibia. Wound is better slight amount of hyper-granulation. Using Hydrofera Blue over Adaptic. 01/07/18; left anterior tibia. The wound had some surface eschar however after this was removed he has no open wound.he was already revascularized by Dr. Jacinto Halim when he came to our clinic with atherectomy of the left SFA and popliteal artery. He was also sent to interventional radiology for venous reflux studies. He was not felt to have significant reflux but certainly has chronic venous changes of his skin with hemosiderin deposition around this area. He will definitely need to lubricate his skin and wear compression stocking and I've talked to him about this. READMISSION 05/26/2018 This is a now 72 year old man we cared for with traumatic wounds on his left anterior lower extremity. He had been previously revascularized during that admission  by Dr. Jacinto Halim. Apparently in follow-up Dr. Jacinto Halim noted that he had deterioration in his arterial status. He underwent a stent placement in the distal left SFA on 04/22/2018. 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 He went back to the angiography suite on 04/30/2018 he underwent PTA and balloon angioplasty of the occluded left mid anterior tibial artery, thrombotic occlusion went from 100 to 0% which reconstitutes the posterior tibial artery. He had thrombectomy and aspiration of the peroneal artery. The stent placed in the distal SFA left SFA was still occluded. 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 He has been recuperating at [facility] in [facility] after the most recent hospitalization. He is going home tomorrow. He tells me that 2 weeks ago he traumatized the tip of his left fifth toe. He came in urgently for our review of this. This was a history of before I noted that Dr. Jacinto Halim had already noted dry gangrenous changes of the left fifth toe 06/09/2018; 2-week follow-up. I did contact Dr. Jacinto Halim after his last appointment and he apparently saw 1 of Dr. Verl Dicker colleagues the next day. He does not follow-up with Dr. Jacinto Halim himself until Thursday of this week. 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 Nevertheless there is no evidence of infection no drainage and no pain. He had a new area that this week when we were signing him in today on the left anterior mid tibia area, this is in close proximity to the previous wound we have dealt with in this clinic. 06/23/2018; 2-week follow-up. I did not receive a recent note from Dr. Jacinto Halim to review today. Our office is trying to obtain this. He is apparently not  planning to do further vascular interventions and wondered about compression to try and help with the patient's chronic venous insufficiency. However we are also concerned about the arterial flow. He arrives in clinic today with a new area  on the left third toe. The areas on the calf/anterior tibia are close to closing. The left fifth toe is still mummified using Betadine. -In reviewing things with the patient he has what sounds like claudication with mild to moderate amount of activity. 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 I prescribed Levaquin over the phone while we attempted to arrange a plan of care. However the patient called yesterday to report he had low-grade fever and he came in today acutely. There is been a marked deterioration in the left third toe with spreading cellulitis up into the dorsal left foot. He was referred to the emergency room. Readmission: 06/29/2020 patient presents today for reevaluation here in our clinic he was previously treated by Dr. Leanord Hawking at the latter part of 2019 in 2 the beginning of 2020. Subsequently we have not seen him since that time in the interim he did have evaluation with vein and vascular specialist specifically Dr. Bo Mcclintock who did perform quite extensive work for a left femoral to anterior tibial artery bypass. With that being said in the interim the patient has developed significant lymphedema and has wounds that he tells me have really never healed in regard to the incision site on the left leg. He also has multiple wounds on the feet for various reasons some of which is that he tends to pick at his feet. Fortunately there is no signs of active infection systemically at this time he does have [name], [name] ([id number]) [id number] Page 4 of 22 some wounds that are little bit deeper but most are fairly superficial he seems to have good blood flow and overall everything appears to  be healthy I see no bone exposed and no obvious signs of osteomyelitis. I do not know that he necessarily needs a x-ray at this point although that something we could consider depending on how things progress. The patient does have a history of lymphedema, diabetes, this is type II, chronic kidney disease stage III, hypertension, and history of peripheral vascular disease. 07/05/2020; patient admitted last week. Is a patient I remember from 2019 he had a spreading infection involving the left foot and we sent him to the hospital. 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 He subsequently had a left femoral to anterior tibial bypass by Dr.Cain vein and vascular. He also has severe lymphedema with chronic skin changes related to that on the left leg. The most problematic area that was new today was on the left medial great toe. This was apparently a small area last week there was purulent drainage which our intake nurse cultured. Also areas on the left medial foot and heel left lateral foot. He has 2 areas on the left medial calf left lateral calf in the setting of the severe lymphedema. 07/13/2020 on evaluation today patient appears to be doing better in my opinion compared to his last visit. The good news is there is no signs of active infection systemically and locally I do not see any signs of infection either. He did have an x-ray which was negative that is great news he had a culture which showed MRSA but at the same time he is been on the doxycycline which has helped. I do think we may want to extend this for 7 additional days 1/25; patient admitted to the clinic a few weeks ago. 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 We have been putting him under compression his edema control is a lot better but he is severe verricused skin on the left leg. He is really done quite well he still has an open area on the left  medial calf and the left medial first metatarsal head. We have been using silver collagen on the leg silver alginate on the foot 07/27/2020 upon evaluation today patient appears to be doing decently well in regard to his wounds. He still has a lot of dry skin on the left leg. Some of this is starting to peel back and I think he may be able to have them out by removing some that today. Fortunately there is no signs of active infection at this time on the left leg although on the right leg he does appear to have swelling and erythema as well as some mild warmth to touch. This does have been concerned about the possibility of cellulitis although within the differential diagnosis I do think that potentially a DVT has to be at least considered. We need to rule that out before proceeding would just call in the cellulitis. Especially since he is having pain in the posterior aspect of his calf muscle. 2/8; the patient had seen sparingly. He has severe skin changes of chronic lymphedema in the left leg thickened hyperkeratotic verrucous skin. He has an open wound on the medial part of the left first met head left mid tibia. He also has a rim of nonepithelialized skin in the anterior mid tibia. He brought in the AmLactin lotion that was been prescribed although I am not sure under compression and its utility. There concern about cellulitis on the right lower leg the last time he was here. He was put on on antibiotics. His DVT rule out was negative. The right leg looks fine he is using his stocking on this area 08/10/2020 upon evaluation today patient appears to be doing well with regard to his leg currently. He has been tolerating the dressing changes without complication. Fortunately there is no signs of active infection which is great news. Overall very pleased with where things stand. 2/22; the patient still has an area on the medial part of the left first met his head. This looks better than when I last saw this  earlier this month he has a rim of epithelialization but still some surface debris. Mostly everything on the left leg is healed. There is still a vulnerable in the left mid tibia area. 08/30/2020 upon evaluation today patient appears to be doing much better in regard to his wounds on his foot. 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 Nonetheless I think we will address that today I Minna send in a prescription for him in that regard. Overall though there does not appear to be any signs of significant worsening. 09/07/2020 on evaluation today patient's wounds over his left foot appear to be doing excellent. I do not see any signs of infection there is some callus buildup this can require debridement for certain but overall I feel like he is managing quite nicely. He still using the AmLactin cream which has been beneficial for him as well. 3/22; left foot wound is closed. There is no open area here. He is using ammonium lactate lotion to the lower extremities to help exfoliate dry cracked skin. He has compression stockings from [facility] in [place]. The wound on the medial part of his left first met head is healed today. READMISSION 04/12/2021 Mr. Epps is a patient we know fairly well he had a prolonged stay in clinic in 2019 with wounds on his left lateral and left anterior lower extremity in the setting of chronic venous insufficiency. More recently he was here earlier this year with predominantly an area on his left foot first metatarsal head plantar and he says the plantar foot broke down on its not long after we discharged him but he did not come back here. The last few months areas of broken down on his left anterior and again the left lateral lower extremity. The leg itself is very swollen chronically enlarged a lot of hyperkeratotic dry Berry Q skin in  the left lower leg. His edema extends well into the thigh. He was seen by Dr. Randie Heinz. He had ABIs on 03/02/2021 showing an ABI on the right of 1 with a TBI of 0.72 his ABI in the left at 1.09 TBI of 0.99. Monophasic and biphasic waveforms on the right. On the left monophasic waveforms were noted he went on to have an angiogram on 03/27/2021 this showed the aortic aortic and iliac segments were free of flow-limiting stenosis the left common femoral vein to evaluate the left femoral to anterior tibial artery bypass was unobstructed the bypass was patent without any areas of stenosis. We discharged the patient in bilateral juxta lite stockings but very clearly that was not sufficient to control the swelling and maintain skin integrity. He is clearly going to need compression pumps. 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 This is fortunate because he is on his feet for long periods of time. 10/27; patient comes in with our intake nurse reporting copious amount of green drainage from the left anterior mid tibia the left dorsal foot and to a lesser extent the left medial mid tibia. We left the compression wrap on all week for the amount of edema in his left leg is quite a bit better. We use silver alginate as the primary dressing 11/3; edema control is good. Left anterior lower leg left medial lower leg and the plantar first metatarsal head. The left anterior lower leg required debridement. Deep tissue culture I did of this wound showed MRSA I put him on 10 days of doxycycline which she will start today. We have him in compression wraps. He has a security card and [employer] however he is retiring on November 15. We will need to then get him into a better offloading boot for the left foot perhaps a total contact cast 11/10; edema control is quite good. Left anterior and left medial lower leg wounds in the setting of chronic venous insufficiency and lymphedema.  He also has a substantial area over the left plantar first metatarsal head. 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 He has significant hypergranulation on the left plantar foot wound. The patient is a diabetic but he does not have significant PAD 11/17; edema control is quite good. Left anterior and left medial lower leg wounds look better. The really concerning area remains the area on the left plantar first metatarsal head. He has a rim of epithelialization. He has been using a surgical shoe The patient is now retired from a a [employer] I  have gone over with him the need to offload this area aggressively. Starting today with a forefoot off loader but . possibly a total contact cast. 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 Arrives with a very significant odor from I think all of the wounds on the left leg and the left foot. Because of this I did not put a total contact cast on him today but will could still consider this. His wife was having cataract surgery which is the reason he missed the appointment 12/6. I saw this man 5 days ago with a swelling below the popliteal fossa. I thought he actually might have a Baker's cyst however the DVT rule out study that we could arrange right away was negative the technician told me this was not a ruptured Baker's cyst. We attempted to get this aspirated by under ultrasound guidance in interventional radiology however all they did was an ultrasound however it shows an extensive fluid collection 62 x 8 x 9.4 in the left thigh and left calf. The patient states he thinks this started 8 days ago or so but he really is not complaining of any pain, fever or systemic symptoms. He has not ha 12/20; after some difficulty I managed to get the patient into see  Dr. Randie Heinz. Eventually he was taken into the hospital and had a drain put in the fluid collection below his left knee posteriorly extending into the posterior thigh. He still has the drain in place. Culture of this showed moderate staff aureus few Morganella [name], [name] ([id number]) [phone].pdf Page 5 of 22 and few Klebsiella he is now on doxycycline and ciprofloxacin as suggested by infectious disease he is on this for a month. The drain will remain in place until it stops draining 12/29; he comes in today with the 1 wound on his left leg and the area on the left plantar first met head significantly smaller. Both look healthy. He still has the drain in the left leg. He says he has to change this daily. Follows up with Dr. Randie Heinz on January 11. 06/29/2021; the wounds that I am following on the left leg and left first met head continued to be quite healthy. However the area where his inferior drain is in place had copious amounts of drainage which was green in color. The wound here is larger. Follows up with Dr. Pascal Lux of vein and vascular his surgeon next week as well as infectious disease. He remains on ciprofloxacin and doxycycline. He is not complaining of excessive pain in either one of the drain areas 1/12; the patient saw vascular surgery and infectious disease. Vascular surgery has left the drain in place as there was still some notable drainage still see him back in 2 weeks. Dr. Dorthula Perfect stop the doxycycline and ciprofloxacin and I do not believe he follows up with them at this point. Culture I did last week showed both doxycycline resistant MRSA and Pseudomonas not sensitive to ciprofloxacin although only in rare titers 1/19; the patient's wound on the left anterior lower leg is just about healed. We have continued healing of the area that was medially on the left leg. Left first plantar metatarsal head continues to get smaller. The major problem here is his 2 drain sites  1 on the left upper calf and lateral thigh. There is purulent drainage still from the  left lateral thigh. I gave him antibiotics last week but we still have recultured. He has the drain in the area I think this is eventually going to have to come out. I suspect there will be a connecting wound to heal here perhaps with improved VAc 1/26; the patient had his drain removed by vein and vascular on 1/25/. This was a large pocket of fluid in his left thigh that seem to tunnel into his left upper calf. 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 His mention his Penrose drain was removed today. He now has a tunneling wound on his left calf and left thigh. Both of these probe widely towards each other although I cannot really prove that they connect. Both wounds on his lower leg anteriorly are closed and his area over the first metatarsal head on his right foot continues to improve. We are using Hydrofera Blue here. He also saw infectious disease culture of the abscess they noted was polymicrobial with MRSA, Morganella and Klebsiella he was treated with doxycycline and ciprofloxacin for 4 weeks ending on 07/03/2021. They did not recommend any further antibiotics. Notable that while he still had the Penrose drain in place last week he had purulent drainage coming out of the inferior IandD site this grew [facility], MRSA and Pseudomonas but there does not appear to be any active infection in this area today with the drain out and he is not systemically unwell 2/2; with regards to the drain sites the superior one on the thigh actually is closed down the one on the upper left lateral calf measures about 8 and half centimeters which is an improvement seems to be less prominent although still with a lot of drainage. The only remaining wound is over the first metatarsal head on the left foot and this looks to be continuing to improve with Hydrofera Blue. 2/9; the area on his plantar left foot  continues to contract. Callus around the wound edge. The drain sites specifically have not come down in depth. We put the wound VAC on Monday he changed the canister late last night our intake nurse reported a pocket of fluid perhaps caused by our compression wraps 2/16; continued improvement in left foot plantar wound. drainage site in the calf is not improved in terms of depth (wound vac) 2/23; continued improvement in the left foot wound over the first metatarsal head. With regards to the drain sites the area on his thigh laterally is healed however the open area on his calf is small in terms of circumference by still probes in by about 15 cm. Within using the wound VAC. Hydrofera Blue on his foot 08/24/2021: The left first metatarsal head wound continues to improve. The wound bed is healthy with just some surrounding callus. Unfortunately the open drain site on his calf remains open and tunnels at least 15 cm (the extent of a Q-tip). This is despite several weeks of wound VAC treatment. Based on reading back through the notes, there has been really no significant change in the depth of the wound, although the orifice is smaller and the more cranial wound on his thigh has closed. I suspect the tunnel tracks nearly all the way to this location. 08/31/2021: Continued improvement in the left first metatarsal head wound. There has been absolutely no improvement to the long tunnel from his open drain site on his calf. We have tried to get him into see vascular surgery sooner to consider the possibility of simply  filleting the tract open and allowing it to heal from the bottom up, likely with a wound VAC. They have not yet scheduled a sooner appointment than his current mid April 09/14/2021: He was seen by vascular surgery and they took him to the operating room last week. They opened a portion of the tunnel, but did not extend the entire length of the known open subcutaneous tract. I read Dr. Darcella Cheshire  operative note and it is not clear from that documentation why only a portion of the tract was opened. The heaped up granulation tissue was curetted and removed from at least some portion of the tract. They did place a wound VAC and applied an Unna boot to the leg. The ulcer on his left first metatarsal head is smaller today. The bed looks good and there is just a small amount of surrounding callus. 09/21/2021: The ulcer on his left first metatarsal head looks to be stalled. There is some callus surrounding the wound but the wound bed itself does not appear particularly dynamic. The tunnel tract on his lateral left leg seems to be roughly the same length or perhaps slightly smaller but the wound bed appears healthy with good granulation tissue. He opened up a new wound on his medial thigh and the site of a prior surgical incision. He says that he did this unconsciously in his sleep by scratching. 09/28/2021: Unfortunately, the ulcer on his left first metatarsal head has extended underneath the callus toward the dorsum of his foot. The medial thigh wounds are roughly the same. The tunnel on his lateral left leg continues to be problematic; it is longer than we are able to actually probe with a Q-tip. I am still not certain as to why Dr. Randie Heinz did not open this up entirely when he took the patient to the operating room. We will likely be back in the same situation with just a small superficial opening in a long unhealed tract, as the open portion is granulating in nicely. 10/02/2021: The patient was initially scheduled for a nurse visit, but we are also applying a total contact cast today. The plantar foot wound looks clean without significant accumulated callus. We have been applying Prisma silver collagen to the site. 10/05/2021: The patient is here for his first total contact cast change. We have tried using gauze packing strips in the tunnel on his lateral leg wound, but this does not seem to be working  any better than the white VAC foam. The foot ulcer looks about the same with minimal periwound callus. Medial thigh wound is clean with just some overlying eschar. 10/12/2021: The plantar foot wound is stable without any significant accumulation of periwound callus. The surface is viable with good granulation tissue. The medial thigh wounds are much smaller and are epithelializing. On the other hand, he had purulent drainage coming from the tunnel on his lateral leg. He does go back to see Dr. Randie Heinz next week and is planning to ask him why the wound tunnel was not completely opened at the time of his most recent operation. 10/19/2021: The plantar foot wound is markedly improved and has epithelial tissue coming through the surface. The medial thigh wounds are nearly closed with just a tiny open area. He did see Dr. Randie Heinz earlier this week and apparently they did discuss the possibility of opening the sinus tract further and enabling a wound VAC application. Apparently there are some limits as to what Dr. Randie Heinz feels comfortable opening, presumably in relationship to  his bypass graft. I think if we could get the tract open to the level of the popliteal fossa, this would greatly aid in her ability to get this chart closed. That being said, however, today when I probed the tract with a Q-tip, I was not able to insert the entirety of the Q-tip as I have on previous occasions. The tunnel is shorter by about 4 cm. The surface is clean with good granulation tissue and no further episodes of purulent drainage. 10/30/2021: Last week, the patient underwent surgery and had the long tract in his leg opened. There was a rind that was debrided, according to the operative report. His medial thigh ulcers are closed. The plantar foot wound is clean with a good surface and some built up surrounding callus. 11/06/2021: The overall dimensions of the large wound on his lateral leg remain about the same, but there is good granulation  tissue present and the tunneling is ZACHARIAN, LIGUORI ([id number]) [id number] Page 6 of 22 a little bit shorter. He has a new wound on his anterior tibial surface, in the same location where he had a similar lesion in the past. The plantar foot wound is clean with some buildup surrounding callus. Just toward the medial aspect of his foot, however, there is an area of darkening that once debrided, revealed another opening in the skin surface. 11/13/2021: The anterior tibial surface wound is closed. The plantar foot wound has some surrounding callus buildup. The area of darkening that I debrided last week and revealed an opening in the skin surface has closed again. The tunnel in the large wound on his lateral leg has come in by about 3 cm. There is healthy granulation tissue on the entire wound surface. 11/23/2021: The patient was out of town last week and did wet-to-dry dressings on his large wound. 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 Unfortunately, he picked open the wound on his left medial thigh. He says that it was itching and he just could not stop scratching it until it was open again. The wound on his plantar foot is smaller and has not accumulated a tremendous amount of callus. The lateral leg wound is shallower and the tunnel has also decreased in depth. There is just a little bit of slough accumulation on the surface. 11/30/2021: Another portion of his left medial thigh has been opened up. All of these wounds are fairly superficial with just a little bit of slough and eschar accumulation. The wound on his plantar foot is almost closed with just a bit of eschar and periwound callus accumulation. The lateral leg wound is nearly flush with the surrounding skin and the tunnel is markedly shallower. 12/07/2021: There is just 1 open area on his left medial thigh. It is clean with just a little bit of  perimeter eschar. The wound on his plantar foot continues to contract and just has some eschar and periwound callus accumulation. The lateral leg wound is closing at the more distal aspect and the tunnel is smaller. The surface is nearly flush with the surrounding skin and it has a good bed of granulation tissue. 12/14/2021: The thigh and foot wounds are closed. The lateral leg wound has closed over approximately half of its length. The tunnel continues to contract and the surface is now flush with the surrounding skin. The wound bed has robust granulation tissue. 12/22/2021: The thigh and foot wounds have reopened. The foot  wound has a lot of callus accumulation around and over it. The thigh wound is tiny with just a little bit of slough in the wound bed. The lateral leg wound continues to contract. His vascular surgeon took the wound VAC off earlier in the week and the patient has been doing wet-to-dry dressings. There is a little slough accumulation on the surface. The tunnel is about 3 cm in depth at this point. 12/28/2021: The thigh wound is closed again. The foot wound has some callus that subsequently has peeled back exposing just a small slit of a wound. The lateral leg wound Is down to about half the size that it originally was and the tunnel is down to about half a centimeter in depth. 01/04/2022: The thigh wound remains closed. The foot wound has heavy callus overlying the wound site. Once this was debrided, the wound was found to be closed. The lateral leg wound is smaller again this week and very superficial. No tunnel could be identified. 01/12/2022: The thigh and foot wounds both remain closed. The lateral leg wound is now nearly flush with the skin surface. There is good granulation tissue present with a light layer of slough. 01/19/2022: Due to the way his wrap was placed, the patient did not change the dressing on his thigh at all and so the foam was saturated and his skin is macerated.  There is a light layer of slough on the wound surface. The underlying granulation tissue is robust and healthy-appearing. He has heavy callus buildup at the site of his first metatarsal head wound which is still healed. 02/01/2022: He has been in silver alginate. When he removed the dressing from his thigh wound, however, some leg, superficially reopening a portion of the wound that had healed. In addition, underneath the callus at his left first metatarsal head, there appears to be a blister and the wound appears to be open again. 02/08/2022: The lateral leg wound has contracted substantially. There is eschar and a light layer of slough present. He says that it is starting to pull and is uncomfortable. On inspection, there is some puckering of the scar and the eschar is quite dry; this may account for his symptoms. On his first metatarsal head, the wound is much smaller with just some eschar on the surface. The callus has not reaccumulated. He reports that he had a blister come up on his medial thigh wound at the distal aspect. It popped and there is now an opening in his skin again. Looking back through his library of wound photos, there is what looks like a permanent suture just deep to this location and it may be trying to erode through. We have been using silver alginate on his wounds. 02/15/2022: The lateral leg wound is about half the size it was last week. It is clean with just a little perimeter eschar and light slough. The wound on his first metatarsal head is about the same with heavy callus overlying it. The medial thigh wound is closed again. He does have some skin changes on the top of his foot that looks potentially yeast related. 02/22/2022: The skin on the top of his foot improved with the use of a topical antifungal. The lateral leg wound continues to contract and is again smaller this week. There is a little bit of slough and eschar on the surface. The first metatarsal head wound is a  little bit smaller but has reaccumulated a thick callus over the top. He decided to try  to trim his toenail and ultimately took the entire nail off of his left great toe. 03/02/2022: His lateral leg wound continues to improve, as does the wound on his left great toe. Unfortunately, it appears that somehow his foot got wet and moisture seeped in through the opening causing his skin to lift. There is a large wound now overlying his first metatarsal on both the plantar, medial, and dorsal portion of his foot. There is necrotic tissue and slough present underneath the shaggy macerated skin. 03/08/2022: The lateral leg wound is smaller again today. There is just a light layer of slough and eschar on the surface. The great toe wound is smaller again today. The first metatarsal wound is a little bit smaller today and does not look nearly as necrotic and macerated. There is still slough and nonviable tissue present. 03/15/2022: The lateral leg wound is narrower and just has a little bit of light slough buildup. The first metatarsal wound still has a fair amount of moisture affecting the periwound skin. The great toe wound is healed. 03/22/2022: The lateral leg wound is now isolated to just at the level of his knee. There is some eschar and slough accumulation. The first metatarsal head wound has epithelialized tremendously and is about half the size that it was last week. He still has some maceration on the top of his foot and a fungal odor is present. 03/29/2022: T oday the patient's foot was macerated, suggesting that the cast got wet. The patient has also been picking at his dry skin and has enlarged the wound on his left lateral leg. In the time between having his cast removed and my evaluation, he had picked more dry skin and opened up additional wounds on his Achilles area and dorsal foot. The plantar first metatarsal head wound, however, is smaller and clean with just macerated callus around the perimeter  and light slough on the surface. The lateral leg wound measured a little bit larger but is also fairly clean with eschar and minimal slough. 04/02/2022: The patient had vascular studies done last Friday and so his cast was not applied. He is here today to have that done. 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 04/05/2022: Both wounds are smaller and quite clean. There is just a little biofilm on the lateral leg wound. 10/20; the patient has a wound on the left lateral surgical incision at the level of his lateral knee this looks clean and improved. He is using silver alginate. He also has an area on his left medial foot for which she is using Hydrofera Blue under a total contact cast both wounds are measuring smaller 04/20/2022: The plantar foot wound has contracted considerably and is very close to closing. The lateral leg wound was measured a little larger, but there was a tiny open area that was included in the measurements that was not included last week. He has some eschar around the perimeter but otherwise the wound looks clean. 04/27/2022: The lateral leg wound looks better this week. He says that midweek, he felt it was very dry and began applying hydrogel to the site. I think this was beneficial. The foot wound is nearly closed underneath a thick layer of dry skin and callus. [name], [name] ([id number]) [id number] Page 7 of 22 05/04/2022: The foot wound is healed. He has developed a new small ulcer on his anterior tibial surface about midway up his leg. It has a little slough on the surface. The lateral leg  wound still is fairly dry, but clean with just a little biofilm on the surface. 05/11/2022: The wound on his foot reopened on Wednesday. A large blister formed which then broke open revealing the fat layer underneath. The ulcer on his anterior tibial surface is a little bit larger this week. The lateral leg wound has much better moisture balance  this week. Fortunately, prior to his foot wound reopening, he did get the cast made for his orthotic. 05/15/2022: Already, the left medial foot wound has improved. The tissue is less macerated and the surface is clean. The ulcer on his anterior tibial surface continues to enlarge. This seems likely secondary to accumulated moisture. The lateral leg wound continues to have an improved moisture balance with the use of collagen. 05/25/2022: The medial foot wound continues to contract. It is now substantially smaller with just a little slough on the surface. The anterior tibial surface wound continues to enlarge further. Once again, this seems to be secondary to moisture. The lateral leg wound does not seem to be changing much in size, but the moisture balance is better. 06/01/2022: The anterior tibial wound is closed. The medial foot wound is down to just a very small, couple of millimeters, opening. The lateral leg wound has good moisture balance, but remains unchanged in size. 12/15; the patient's anterior tibial wound has reopened, however the area on his right first metatarsal head is closed. The major wound is actually on the superior part of his surgical wound in the left lateral thigh. Not a completely viable surface under illumination. This may at some point require a debridement I think he is currently using Prisma. As noted the left medial foot wound has closed 06/14/2022: The anterior tibial wound has closed. The lateral leg wound has a better surface but is basically unchanged in size. The left medial foot wound has reopened. It looks as though there was some callus accumulation and moisture got under the callus which caused the tissue to break down again. 06/21/2022: A new wound has opened up just distal to the previous anterior tibial wound. It is small but has hypertrophic granulation tissue present. The lateral leg wound is a little bit narrower and has a layer of slough on the surface.  The left medial foot wound is down to just a pinhole. His custom orthotics should be available next week. 06/28/2022: The wound on his first metatarsal head has healed. He has developed a new small wound on his medial lower leg, in an old scar site. The lateral leg wound continues to contract but continues to accumulate slough, as well. 07/03/2022: Despite wearing his custom orthopedic shoes, he managed to reopen the wound on his first metatarsal head. He says he thinks his foot got wet and then some skin lifted up and he peeled this away. Both of the lower leg wounds are smaller and have some dry eschar on the surface. The lateral leg wound is quite a bit narrower today. 07/12/2022: The medial lower leg wound is closed. The anterior lower leg wound has contracted considerably. The lateral upper leg wound is narrower with a layer of slough on the surface. The first metatarsal head wound is also smaller, but had copious drainage which saturated the foam border dressing and resulted in some periwound tissue maceration. Fortunately there was no breakdown at this site. 07/19/2022: The lower leg shows signs of significant maceration; I think he must be sweating excessively inside his cast. There are several areas of skin breakdown present.  The wound on his foot is smaller and that on his lateral leg is narrower and is shorter by about a centimeter. 07/26/2022: Last week we used a zinc Coflex wrap prior to applying his total contact cast and this has had the effect of keeping his skin from getting macerated this week. The anterior leg wound has epithelialized substantially. The lateral leg wound is significantly smaller with just a bit of slough on the surface. The first metatarsal head wound is also smaller this week. 08/02/2022: The anterior leg wound was closed on arrival, but while he was sitting in the room, he picked it open again. The lateral leg wound is smaller with just a little slough on the surface and  the first metatarsal head wound has contracted further, as well. 08/09/2022: The first metatarsal head wound is covered with callus. Underneath the callus, it is nearly completely closed. The lateral leg wound is smaller again this week. The anterior leg wound looks better, but he has such heavy buildup of old skin, that moisture is getting underneath this, becoming trapped, and causing the underlying skin to get macerated and open up. 08/16/2022: The first metatarsal head wound is closed. The lateral leg wound continues to contract and is quite a bit smaller again this week. There is just a small, superficial opening remaining on his anterior tibial surface. 08/23/2022: The first metatarsal head wound has, by some miracle, remained closed. The lateral leg wound is substantially smaller with multiple areas of epithelialization. The anterior tibial surface wound is also quite a bit smaller and very clean. 08/30/2022: Unfortunately, his first metatarsal head wound opened up again. It happened in the same fashion as it has on prior occasions. Moisture got under dried skin/callus and created a wound when he removed his sock, taking the skin with it. The anterior tibial surface has a thick shell of hyperkeratotic skin. This has been contributing to ongoing repeat wounding events as moisture gets underneath this and causes tissue breakdown. 3/15; patient presents for follow-up. His anterior left leg wound has healed. He still has the wound to the left lateral aspect and left first met head. We have been using silver alginate and endoform to these areas under [employer]. He has no issues or complaints today. He has been taking Augmentin and reports improvement to his symptoms to the left first met head. 09/13/2022: He has accumulated more thick dry skin in sheets on his lower leg. The lateral leg wound is about the same size and the left first metatarsal head wound is a little bit smaller. There is slough on both  surfaces. There is callus buildup around the foot wound. 09/20/2022: The lateral leg wound is a little bit narrower and the left first metatarsal head wound also seems to have contracted slightly. There is slough on both surfaces. He has a little skin breakdown on his anterior tibial surface. 09/27/2022: The lateral leg wound continues to contract and is quite clean. The first metatarsal head wound is also smaller. There is some perimeter callus and slough accumulation on the foot. The anterior tibial surface is closed. 10/04/2022: Both of his wounds are smaller today, particularly the first metatarsal head wound. 10/18/2022: He missed his appointment last week and ended up cutting off his wrap on Saturday. The anterior tibial wound reopened. It is fairly superficial with a little bit of slough on the surface. His lateral leg wound is smaller with some slough and eschar buildup. The first metatarsal head wound is also smaller  with some callus and slough accumulation. 10/25/2022: All wounds are smaller. There is slough and eschar on the lateral leg and slough and callus on the plantar foot wound. The anterior tibial wound is clean and flush with the surrounding skin. No debris accumulation here. 11/01/2022: The wounds are all smaller again this week. There is slough on the lateral leg and some minimal slough and eschar on the anterior tibial wound. There is callus accumulation on the first metatarsal head site, along with slough. There is also a yeasty odor coming from the foot. 11/08/2022: The lateral leg wound is smaller except where he picked some skin while waiting to be seen. There is a little bit of slough on the surface. The anterior [name], MALINA ([id number]) [id number] Page 8 of 22 tibial wound is closed. The first metatarsal head site has gotten macerated once again and has a lot of spongy wet tissue and callus around it. No yeast odor today. 11/15/2022: The lateral leg  wound continues to contract. There is now a band of epithelium dividing it into 2 areas. Minimal slough accumulation. He managed to pick open a new wound on his medial lower leg. The first metatarsal head site is smaller with some callus accumulation but no tissue maceration. 11/22/2022: The lateral leg wound is smaller again by about a third today. There is a little bit of slough on the surface with some periwound eschar. The new wound that he picked open last week has healed but he picked open 3 new small wounds on his anterior tibial surface, once again due to his picking at his skin. The first metatarsal head wound looks about the same. There has been some callus accumulation and there is more edema present, as he was not put in a compression wrap last week. 11/29/2022: The lateral leg wound is smaller again today. There is a little periwound eschar and some slough present. The wounds on his anterior tibial surface are all closed except for 1 that has a little bit of slough on the open portion with eschar covering it. The first metatarsal head wound measured a little bit smaller today, but mostly looks about the same. Edema control is better. 12/06/2022: The anterior tibial wound is closed. The lateral leg wound is smaller with some slough and eschar accumulation. The plantar foot ulcer is about the same size, but the tissue does show some evidence of the fact that he was on his feet quite a bit more this past week; there was a death in his family. There has been more callus accumulation and the tissues are little bit more purpleish. 12/13/2022: He picked the skin on his anterior tibia and reopened a wound there while he was waiting to be seen in clinic. The lateral leg wound is much smaller with a little bit of slough and eschar. The plantar foot ulcer is smaller today with callus and slough buildup, but without the pressure induced tissue injury that was seen last week. 12/20/2022: The anterior tibial  wound has healed. The lateral leg wound is smaller again this week. There is a little bit of eschar buildup on the surface. The foot had a fairly strong odor coming from it that persisted even after washing. The wound itself looks like its gotten a little smaller but he has built up thick callus, once again. 12/26/2022: The lateral leg wound is down to just a narrow superficial slit. There is slough and a little bit of dry skin present. The foot is in  much better shape today. There is less callus accumulation and no odor. The skin edges are starting to roll inward, however. 01/03/2023: The lateral leg wound is healed. The skin is quite dry but the wound has closed. The foot continues to contract. He has a fair amount of periwound callus accumulation and the surface of the is a little drier than ideal. 01/10/2023: The large lateral leg wound remains closed. He managed to pick off some of his dry skin and has multiple small open superficial wounds on his lower leg. The foot wound measures smaller, but he has a blister immediately adjacent to it. 01/17/2023: The large lateral leg wound remains closed. The small superficial wounds on his lower leg have also closed. The foot wound is about the same and the blister area immediately adjacent to it has dark eschar on the surface. The foot wound looks a bit dry. 01/24/2023: He picked at some dry skin on his lateral leg wound and reopened. The surface is dry and fibrotic. The foot wound is slightly smaller but has periwound callus and the edges are rolling inward again. 01/31/2023: His lateral leg wound is larger again today. It appears that the Ace bandage may be rubbing and causing some tissue breakdown. His foot wound is also a bit larger and the tissue does not appear particularly healthy. 02/07/2023: The lateral leg wound improved quite significantly over the last week. He has been applying additional gauze over the site before applying the Ace bandage and this  seems to have minimized the rubbing. The foot wound is about the same. The culture that I took last week returned with a polymicrobial population of predominantly skin flora. He is currently taking Augmentin as recommended by the culture data. 02/14/2023: The lateral leg wound is covered in a layer of eschar. Underneath, the wound is nearly healed again. The foot wound looks better today. The depth around the edges has decreased. The tissue has a healthier appearance. He is still taking Augmentin. Electronic Signature(s) Signed: 02/14/2023 11:05:16 AM By: [name] MD FACS Entered By: [name] on 02/14/2023 08:05:16 -------------------------------------------------------------------------------- Physical Exam Details Patient Name: Date of Service: [name]. 02/14/2023 10:00 A M Medical Record Number: [id number] Patient Account Number: [id number] Date of Birth/Sex: Treating RN: 1951/03/19 (72 y.o. M) Primary Care Provider: [name] Other Clinician: Referring Provider: Treating Provider/Extender: [name] in Treatment: 96 Constitutional Hypertensive, asymptomatic. . . . no acute distress. Respiratory Normal work of breathing on room air. Notes 02/14/2023: The lateral leg wound is covered in a layer of eschar. Underneath, the wound is nearly healed again. The foot wound looks better today. The depth around the edges has decreased. The tissue has a healthier appearance. RAMSES, LENGLE ([id number]) [id number] Page 9 of 22 Electronic Signature(s) Signed: 02/14/2023 11:05:50 AM By: [name] MD FACS Entered By: [name] on 02/14/2023 08:05:50 -------------------------------------------------------------------------------- Physician Orders Details Patient Name: Date of Service: [name]. 02/14/2023 10:00 A M Medical Record Number: [id number] Patient Account Number: [id number] Date of Birth/Sex:  Treating RN: 02-20-1951 (72 y.o. 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 - Dr Lady Gary - Room 2 Return A Anesthetic (In clinic) Topical Lidocaine 4% applied to wound bed Bathing/ Shower/ Hygiene May shower with protection but do not get wound dressing(s) wet. Protect dressing(s) with water repellant cover (for example, large plastic bag) or a cast cover and may then take shower. Edema Control - Lymphedema / SCD / Other Elevate legs to the level of the heart or above for 30 minutes daily and/or when sitting for 3-4 times a day throughout the day. Avoid standing for long periods of time. Patient to wear own compression stockings every day. - both legs daily Moisturize legs daily. Compression stocking or Garment 20-30 mm/Hg pressure to: Off-Loading Wound #18R Left,Plantar Metatarsal head first Open toe surgical shoe to: - Front off loader Left ft Other: - minimal weight bearing left foot Additional Orders / Instructions Follow Nutritious Diet - vitamin C 500 mg 3 times a day and zinc 30-50 mg a day Wound Treatment Wound #18R - Metatarsal head first Wound Laterality: Plantar, Left Cleanser: Soap and Water 1 x Per Week/30 Days Discharge Instructions: May shower and wash wound with dial antibacterial soap and  water prior to dressing change. Cleanser: Wound Cleanser 1 x Per Week/30 Days Discharge Instructions: Cleanse the wound with wound cleanser prior to applying a clean dressing using gauze sponges, not tissue or cotton balls. Peri-Wound Care: Ketoconazole Cream 2% 1 x Per Week/30 Days Discharge Instructions: Apply Ketoconazole as directed Peri-Wound Care: Triamcinolone 15 (g) 1 x Per Week/30 Days Discharge Instructions: Use triamcinolone 15 (g) as directed Topical: Gentamicin 1 x Per Week/30 Days Discharge Instructions: As directed by physician [name] ([id number]) [id number].pdf Page 10 of 22 Topical: Mupirocin Ointment 1 x Per Week/30 Days Discharge Instructions: Apply Mupirocin (Bactroban) as instructed Prim Dressing: Maxorb Extra Ag+ Alginate Dressing, 2x2 (in/in) 1 x Per Week/30 Days ary Discharge Instructions: Apply to wound bed as instructed Secondary Dressing: Optifoam Non-Adhesive Dressing, 4x4 in (Generic) 1 x Per Week/30 Days Discharge Instructions: Apply over primary dressing as directed. Secondary Dressing: Woven Gauze Sponges 2x2 in (Generic) 1 x Per Week/30 Days Discharge Instructions: Apply over primary dressing as directed. Secondary Dressing: Zetuvit Absorbent Pad, 4x4 (in/in) 1 x Per Week/30 Days Compression Wrap: Urgo K2, (equivalent to a 4 layer) two layer compression system, regular 1 x Per Week/30 Days Discharge Instructions: Apply Urgo K2 as directed (alternative to 4 layer compression). Wound #22R - Lower Leg Wound Laterality: Left, Lateral, Proximal Cleanser: Soap and Water 1 x Per Day/30 Days Discharge Instructions: May shower and wash wound with dial antibacterial soap and water prior to dressing change. Cleanser: Wound Cleanser 1 x Per Day/30 Days Discharge Instructions: Cleanse the wound with wound cleanser prior to applying a clean dressing using gauze sponges, not tissue or cotton balls. Prim Dressing: Endoform 2x2 in 1 x Per  Day/30 Days ary Discharge Instructions: Moisten with saline Secondary Dressing: Woven Gauze Sponge, Non-Sterile 4x4 in 1 x Per Day/30 Days Discharge Instructions: Apply over primary dressing as directed. Secured With: Elastic Bandage 4 inch (ACE bandage) 1 x Per Day/30 Days Discharge Instructions: Secure with ACE bandage as directed. Secured With: [employer], 4.5x3.1 (in/yd) 1 x Per Day/30 Days Discharge Instructions: Secure with Kerlix as directed. Patient Medications llergies: No Known Drug Allergies A Notifications Medication Indication Start End 02/14/2023 lidocaine DOSE topical 4 % cream - cream  topical Electronic Signature(s) Signed: 02/14/2023 12:59:10 PM By: [name] MD FACS Entered By: [name] on 02/14/2023 08:06:10 -------------------------------------------------------------------------------- Problem List Details Patient Name: Date of Service: [name]. 02/14/2023 10:00 A M Medical Record Number: [id number] Patient Account Number: [id number] Date of Birth/Sex: Treating RN: Oct 11, 1950 (72 y.o. 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 01/24/2023 No Yes I87.322 Chronic venous hypertension (idiopathic) with inflammation of left lower 04/12/2021 No Yes extremity E11.51 Type 2 diabetes mellitus with diabetic peripheral angiopathy without gangrene 04/12/2021 No Yes I89.0 Lymphedema, not elsewhere classified 04/12/2021 No Yes L85.9 Epidermal thickening, unspecified 08/30/2022 No Yes Inactive  Problems ICD-10 Code Description Active Date Inactive Date E11.621 Type 2 diabetes mellitus with foot ulcer 04/12/2021 04/12/2021 E11.42 Type 2 diabetes mellitus with diabetic polyneuropathy 04/12/2021 04/12/2021 L02.416 Cutaneous abscess of left lower limb 06/13/2021 06/13/2021 Resolved Problems ICD-10 Code Description Active Date Resolved Date L97.828 Non-pressure chronic ulcer of other part of left lower leg with other specified severity 04/12/2021 04/12/2021 U27.253 Non-pressure chronic ulcer of other part of left lower leg with other specified severity 01/10/2023 06/08/2022 Electronic Signature(s) Signed: 02/14/2023 11:00:31 AM By: [name] MD FACS Entered By: [name] on 02/14/2023 08:00:31 -------------------------------------------------------------------------------- Progress Note Details Patient Name: Date of Service: [name]. 02/14/2023 10:00 A M Medical Record Number: [id number] Patient Account Number: [id number] Date of Birth/Sex: Treating RN: [birth year] (72 y.o. M) Primary Care Provider: [name] Other Clinician: Referring Provider: Treating Provider/Extender: [name] in Treatment: 96 Subjective Chief Complaint Information obtained from Patient [name], [name] ([id number]) [id number] Page 12 of 22 Left leg and foot ulcers 04/12/2021; patient is here for wounds on his left lower leg and left plantar foot over the first metatarsal head History of Present Illness (HPI) 10/11/17; Mr. Braude is a 72 year old man who tells me that in 2015 he slipped down the latter traumatizing his left leg. He developed a wound in the same spot the area that we are currently looking at. He states this closed over for the most part although he always felt it was somewhat unstable. In 2016 he hit the same area with the door of his car had this reopened. He tells me that this is never really closed although sometimes  an inflow it remains open on a constant basis. He has not been using any specific dressing to this except for topical antibiotics the nature of which were not really sure. His primary doctor did send him to see Dr. Jacinto Halim of interventional cardiology. He underwent an angiogram on 08/06/17 and he underwent a PTA and directional atherectomy of the lesser distal SFA and popliteal arteries which resulted in brisk improvement in blood flow. It was noted that he had 2 vessel runoff through the anterior tibial and peroneal. He is also been to see vascular and interventional radiologist. He was not felt to have any significant superficial venous insufficiency. Presumably is not a candidate for any ablation. It was suggested he come here for wound care. The patient is a type II diabetic on insulin. He also has a history of venous insufficiency. ABIs on the left were noncompressible in our clinic  10/21/17; patient we admitted to the clinic last week. 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 We put Iodosorb on him after an aggressive debridement and 3 layer compression. He complained of pain in his ankle and itching with is skin in fact he scratched the area on the medial calf superiorly at the rim of our wraps and he has 2 small open areas in that location today which are new. I changed his primary dressing today to silver collagen. As noted he is already had revascularization and does not have any significant superficial venous insufficiency that would be amenable to ablation 10/28/17; patient admitted to the clinic 2 weeks ago. He has a smaller Wound. Scratch injury from last week revealed. There is large wound over the tibial area. This is smaller. Granulation looks healthy. No need for debridement. 11/04/17; the wound on the left lateral calf looks better. Improved dimensions. Surface of this looks better. We've been maintaining him and Kerlix  C[place]an wraps. He finds this much more comfortable. Silver collagen dressing 11/11/17; left lateral Wound continues to look healthy be making progress. Using a #5 curet I removed removed nonviable skin from the surface of the wound and then necrotic debris from the wound surface. Surface of the wound continues to look healthy. He also has an open area on the left great toenail bed. We've been using topical antibiotics. 11/19/17; left anterior lateral wound continues to look healthy but it's not closed. He also had a small wound above this on the left leg Initially traumatic wounds in the setting of significant chronic venous insufficiency and stasis dermatitis 11/25/17; left anterior wounds superiorly is closed still a small wound inferiorly. 12/02/17; left anterior tibial area. Arrives today with adherent callus. Post debridement clearly not completely closed. Hydrofera Blue under 3 layer compression. 12/09/17; left anterior tibia. Circumferential eschar however the wound bed looks stable to improved. We've been using Hydrofera Blue under 3 layer compression 12/17/17; left anterior tibia. Apparently this was felt to be closed however when the wrap was taken off there is a skin tear to reopen wounds in the same area we've been using Hydrofera Blue under 3 layer compression 12/23/17 left anterior tibia. Not close to close this week apparently the [facility] was stuck to this again. Still circumferential eschar requiring debridement. I put a contact layer on this this time under the Hydrofera Blue 12/31/17; left anterior tibia. Wound is better slight amount of hyper-granulation. Using Hydrofera Blue over Adaptic. 01/07/18; left anterior tibia. The wound had some surface eschar however after this was removed he has no open wound.he was already revascularized by Dr. Jacinto Halim when he came to our clinic with atherectomy of the left SFA and popliteal artery. He was also sent to interventional radiology for venous  reflux studies. He was not felt to have significant reflux but certainly has chronic venous changes of his skin with hemosiderin deposition around this area. He will definitely need to lubricate his skin and wear compression stocking and I've talked to him about this. READMISSION 05/26/2018 This is a now 72 year old man we cared for with traumatic wounds on his left anterior lower extremity. He had been previously revascularized during that admission by Dr. Jacinto Halim. Apparently in follow-up Dr. Jacinto Halim noted that he had deterioration in his arterial status. He underwent a stent placement in the distal left SFA on 04/22/2018. 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 He went back to the angiography  suite on 04/30/2018 he underwent PTA and balloon angioplasty of the occluded left mid anterior tibial artery, thrombotic occlusion went from 100 to 0% which reconstitutes the posterior tibial artery. He had thrombectomy and aspiration of the peroneal artery. The stent placed in the distal SFA left SFA was still occluded. 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 He has been recuperating at [facility] nursing home in [facility] after the most recent hospitalization. He is going home tomorrow. He tells me that 2 weeks ago he traumatized the tip of his left fifth toe. He came in urgently for our review of this. This was a history of before I noted that Dr. Jacinto Halim had already noted dry gangrenous changes of the left fifth toe 06/09/2018; 2-week follow-up. I did contact Dr. Jacinto Halim after his last appointment and he apparently saw 1 of Dr. Verl Dicker colleagues the next day. He does not follow-up with Dr. Jacinto Halim himself until Thursday of this week. 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 Nevertheless there is no evidence of infection no drainage and no pain. He had a new area that this week when we were signing him in today on the left anterior mid tibia area, this is in close proximity to the previous wound we have dealt with in this clinic. 06/23/2018; 2-week follow-up. I did not receive a recent note from Dr. Jacinto Halim to review today. Our office is trying to obtain this. He is apparently not planning to do further vascular interventions and wondered about compression to try and help with the patient's chronic venous insufficiency. However we are also concerned about the arterial flow. He arrives in clinic today with a new area on the left third toe. The areas on the calf/anterior tibia are close to closing. The left fifth toe is still mummified using Betadine. -In reviewing things with the patient he has what sounds like claudication with mild to moderate amount of activity. 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 I prescribed Levaquin over the phone while we attempted to arrange a plan of care. However the patient called yesterday to report he had low-grade fever and he came in today acutely. There is been a marked deterioration in the left third toe with spreading cellulitis up into the dorsal left foot. He was referred to the emergency room. Readmission: 06/29/2020 patient presents today for reevaluation here in our clinic he was previously treated by Dr. Leanord Hawking at the latter part of 2019 in 2 the beginning of 2020. Subsequently we have not seen him since that time in the interim he did have evaluation with vein and vascular specialist specifically Dr. Bo Mcclintock who did perform quite extensive work for a left femoral to anterior tibial artery bypass. With that being said in the interim the patient has developed significant lymphedema and has wounds that he tells me have really never healed in  regard to the incision site on the left leg. He also has multiple wounds on the feet for various reasons some of which is that he tends to pick at his feet. Fortunately there is no signs of active infection systemically at this time he does have some wounds that are little  bit deeper but most are fairly superficial he seems to have good blood flow and overall everything appears to be healthy I see no bone exposed and no obvious signs of osteomyelitis. I do not know that he necessarily needs a x-ray at this point although that something we could consider depending on how things progress. The patient does have a history of lymphedema, diabetes, this is type II, chronic kidney disease stage III, hypertension, and history of peripheral vascular disease. 07/05/2020; patient admitted last week. Is a patient I remember from 2019 he had a spreading infection involving the left foot and we sent him to the hospital. 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 He subsequently had a left femoral to anterior tibial bypass by Dr.[facility]. He also has severe lymphedema with chronic skin changes related to that on the left leg. The most problematic area that was new today was on the left medial great toe. This was apparently a small area last week there was purulent drainage which [name], [name] ([id number]) [phone].pdf Page 13 of 22 our intake nurse cultured. Also areas on the left medial foot and heel left lateral foot. He has 2 areas on the left medial calf left lateral calf in the setting of the severe lymphedema. 07/13/2020 on evaluation today patient appears to be doing better in my opinion compared to his last visit. The good news is there is no signs of active infection systemically and locally I do not see any signs of infection either. He did have an x-ray which was negative that is great news he had a culture which showed MRSA but  at the same time he is been on the doxycycline which has helped. I do think we may want to extend this for 7 additional days 1/25; patient admitted to the clinic a few weeks ago. 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 We have been putting him under compression his edema control is a lot better but he is severe verricused skin on the left leg. He is really done quite well he still has an open area on the left medial calf and the left medial first metatarsal head. We have been using silver collagen on the leg silver alginate on the foot 07/27/2020 upon evaluation today patient appears to be doing decently well in regard to his wounds. He still has a lot of dry skin on the left leg. Some of this is starting to peel back and I think he may be able to have them out by removing some that today. Fortunately there is no signs of active infection at this time on the left leg although on the right leg he does appear to have swelling and erythema as well as some mild warmth to touch. This does have been concerned about the possibility of cellulitis although within the differential diagnosis I do think that potentially a DVT has to be at least considered. We need to rule that out before proceeding would just call in the cellulitis. Especially since he is having pain in the posterior aspect of his calf muscle. 2/8; the patient had seen sparingly. He has severe skin changes of chronic lymphedema in the left leg thickened hyperkeratotic verrucous skin. He has an open wound on the medial part of the left first met head left mid tibia. He also has a rim of nonepithelialized skin in the anterior mid tibia. He  brought in the AmLactin lotion that was been prescribed although I am not sure under compression and its utility. There concern about cellulitis on the right lower leg the last time he was here. He was put on on antibiotics. His DVT rule out was negative. The right leg  looks fine he is using his stocking on this area 08/10/2020 upon evaluation today patient appears to be doing well with regard to his leg currently. He has been tolerating the dressing changes without complication. Fortunately there is no signs of active infection which is great news. Overall very pleased with where things stand. 2/22; the patient still has an area on the medial part of the left first met his head. This looks better than when I last saw this earlier this month he has a rim of epithelialization but still some surface debris. Mostly everything on the left leg is healed. There is still a vulnerable in the left mid tibia area. 08/30/2020 upon evaluation today patient appears to be doing much better in regard to his wounds on his foot. 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 Nonetheless I think we will address that today I Minna send in a prescription for him in that regard. Overall though there does not appear to be any signs of significant worsening. 09/07/2020 on evaluation today patient's wounds over his left foot appear to be doing excellent. I do not see any signs of infection there is some callus buildup this can require debridement for certain but overall I feel like he is managing quite nicely. He still using the AmLactin cream which has been beneficial for him as well. 3/22; left foot wound is closed. There is no open area here. He is using ammonium lactate lotion to the lower extremities to help exfoliate dry cracked skin. He has compression stockings from elastic therapy in . The wound on the medial part of his left first met head is healed today. READMISSION 04/12/2021 Mr. Mcclaine is a patient we know fairly well he had a prolonged stay in clinic in 2019 with wounds on his left lateral and left anterior lower extremity in the  setting of chronic venous insufficiency. More recently he was here earlier this year with predominantly an area on his left foot first metatarsal head plantar and he says the plantar foot broke down on its not long after we discharged him but he did not come back here. The last few months areas of broken down on his left anterior and again the left lateral lower extremity. The leg itself is very swollen chronically enlarged a lot of hyperkeratotic dry Berry Q skin in the left lower leg. His edema extends well into the thigh. He was seen by Dr. Randie Heinz. He had ABIs on 03/02/2021 showing an ABI on the right of 1 with a TBI of 0.72 his ABI in the left at 1.09 TBI of 0.99. Monophasic and biphasic waveforms on the right. On the left monophasic waveforms were noted he went on to have an angiogram on 03/27/2021 this showed the aortic aortic and iliac segments were free of flow-limiting stenosis the left common femoral vein to evaluate the left femoral to anterior tibial artery bypass was unobstructed the bypass was patent without any areas of stenosis. We discharged the patient in bilateral juxta lite stockings but very clearly that was not sufficient to control the  swelling and maintain skin integrity. He is clearly going to need compression pumps. 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 This is fortunate because he is on his feet for long periods of time. 10/27; patient comes in with our intake nurse reporting copious amount of green drainage from the left anterior mid tibia the left dorsal foot and to a lesser extent the left medial mid tibia. We left the compression wrap on all week for the amount of edema in his left leg is quite a bit better. We use silver alginate as the primary dressing 11/3; edema control is good. Left anterior lower leg left medial lower leg and the plantar first metatarsal head. The left anterior lower leg required  debridement. Deep tissue culture I did of this wound showed MRSA I put him on 10 days of doxycycline which she will start today. We have him in compression wraps. He has a security card and [employer] however he is retiring on November 15. We will need to then get him into a better offloading boot for the left foot perhaps a total contact cast 11/10; edema control is quite good. Left anterior and left medial lower leg wounds in the setting of chronic venous insufficiency and lymphedema. He also has a substantial area over the left plantar first metatarsal head. 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 He has significant hypergranulation on the left plantar foot wound. The patient is a diabetic but he does not have significant PAD 11/17; edema control is quite good. Left anterior and left medial lower leg wounds look better. The really concerning area remains the area on the left plantar first metatarsal head. He has a rim of epithelialization. 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 Starting today with a forefoot off loader but . possibly a total contact cast. 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 Arrives with a very significant odor from I think all of the wounds on the left leg and the left foot. Because of this I did not put a total contact cast on him today but will could still consider this. His wife was having cataract surgery which is the reason he missed the appointment 12/6. I saw this man 5 days ago with a swelling below the popliteal fossa. I thought he actually might have a Baker's cyst however the DVT rule out study that we could arrange right away was negative the  technician told me this was not a ruptured Baker's cyst. We attempted to get this aspirated by under ultrasound guidance in interventional radiology however all they did was an ultrasound however it shows an extensive fluid collection 62 x 8 x 9.4 in the left thigh and left calf. The patient states he thinks this started 8 days ago or so but he really is not complaining of any pain, fever or systemic symptoms. He has not ha 12/20; after some difficulty I managed to get the patient into see Dr. Randie Heinz. Eventually he was taken into the hospital and had a drain put in the fluid collection below  his left knee posteriorly extending into the posterior thigh. He still has the drain in place. Culture of this showed moderate staff aureus few Morganella and few Klebsiella he is now on doxycycline and ciprofloxacin as suggested by infectious disease he is on this for a month. The drain will remain in place until it stops draining 12/29; he comes in today with the 1 wound on his left leg and the area on the left plantar first met head significantly smaller. Both look healthy. He still has the drain in the left leg. He says he has to change this daily. Follows up with Dr. Randie Heinz on January 11. [name], [name] ([id number]) [id number] Page 14 of 22 06/29/2021; the wounds that I am following on the left leg and left first met head continued to be quite healthy. However the area where his inferior drain is in place had copious amounts of drainage which was green in color. The wound here is larger. Follows up with Dr. Pascal Lux of vein and vascular his surgeon next week as well as infectious disease. He remains on ciprofloxacin and doxycycline. He is not complaining of excessive pain in either one of the drain areas 1/12; the patient saw vascular surgery and infectious disease. Vascular surgery has left the drain in place as there was still some notable drainage still see him back in 2 weeks. Dr. Dorthula Perfect  stop the doxycycline and ciprofloxacin and I do not believe he follows up with them at this point. Culture I did last week showed both doxycycline resistant MRSA and Pseudomonas not sensitive to ciprofloxacin although only in rare titers 1/19; the patient's wound on the left anterior lower leg is just about healed. We have continued healing of the area that was medially on the left leg. Left first plantar metatarsal head continues to get smaller. The major problem here is his 2 drain sites 1 on the left upper calf and lateral thigh. There is purulent drainage still from the left lateral thigh. I gave him antibiotics last week but we still have recultured. He has the drain in the area I think this is eventually going to have to come out. I suspect there will be a connecting wound to heal here perhaps with improved VAc 1/26; the patient had his drain removed by vein and vascular on 1/25/. This was a large pocket of fluid in his left thigh that seem to tunnel into his left upper calf. 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 His mention his Penrose drain was removed today. He now has a tunneling wound on his left calf and left thigh. Both of these probe widely towards each other although I cannot really prove that they connect. Both wounds on his lower leg anteriorly are closed and his area over the first metatarsal head on his right foot continues to improve. We are using Hydrofera Blue here. He also saw infectious disease culture of the abscess they noted was polymicrobial with MRSA, Morganella and Klebsiella he was treated with doxycycline and ciprofloxacin for 4 weeks ending on 07/03/2021. They did not recommend any further antibiotics. Notable that while he still had the Penrose drain in place last week he had purulent drainage coming out of the inferior IandD site this grew [facility], MRSA and Pseudomonas but there does not appear to be any active infection in this area today  with the drain out and he is not systemically unwell 2/2; with regards to the drain sites the superior one  on the thigh actually is closed down the one on the upper left lateral calf measures about 8 and half centimeters which is an improvement seems to be less prominent although still with a lot of drainage. The only remaining wound is over the first metatarsal head on the left foot and this looks to be continuing to improve with Hydrofera Blue. 2/9; the area on his plantar left foot continues to contract. Callus around the wound edge. The drain sites specifically have not come down in depth. We put the wound VAC on Monday he changed the canister late last night our intake nurse reported a pocket of fluid perhaps caused by our compression wraps 2/16; continued improvement in left foot plantar wound. drainage site in the calf is not improved in terms of depth (wound vac) 2/23; continued improvement in the left foot wound over the first metatarsal head. With regards to the drain sites the area on his thigh laterally is healed however the open area on his calf is small in terms of circumference by still probes in by about 15 cm. Within using the wound VAC. Hydrofera Blue on his foot 08/24/2021: The left first metatarsal head wound continues to improve. The wound bed is healthy with just some surrounding callus. Unfortunately the open drain site on his calf remains open and tunnels at least 15 cm (the extent of a Q-tip). This is despite several weeks of wound VAC treatment. Based on reading back through the notes, there has been really no significant change in the depth of the wound, although the orifice is smaller and the more cranial wound on his thigh has closed. I suspect the tunnel tracks nearly all the way to this location. 08/31/2021: Continued improvement in the left first metatarsal head wound. There has been absolutely no improvement to the long tunnel from his open drain site on his calf. We  have tried to get him into see vascular surgery sooner to consider the possibility of simply filleting the tract open and allowing it to heal from the bottom up, likely with a wound VAC. They have not yet scheduled a sooner appointment than his current mid April 09/14/2021: He was seen by vascular surgery and they took him to the operating room last week. They opened a portion of the tunnel, but did not extend the entire length of the known open subcutaneous tract. I read Dr. Darcella Cheshire operative note and it is not clear from that documentation why only a portion of the tract was opened. The heaped up granulation tissue was curetted and removed from at least some portion of the tract. They did place a wound VAC and applied an Unna boot to the leg. The ulcer on his left first metatarsal head is smaller today. The bed looks good and there is just a small amount of surrounding callus. 09/21/2021: The ulcer on his left first metatarsal head looks to be stalled. There is some callus surrounding the wound but the wound bed itself does not appear particularly dynamic. The tunnel tract on his lateral left leg seems to be roughly the same length or perhaps slightly smaller but the wound bed appears healthy with good granulation tissue. He opened up a new wound on his medial thigh and the site of a prior surgical incision. He says that he did this unconsciously in his sleep by scratching. 09/28/2021: Unfortunately, the ulcer on his left first metatarsal head has extended underneath the callus toward the dorsum of his foot. The medial thigh wounds  are roughly the same. The tunnel on his lateral left leg continues to be problematic; it is longer than we are able to actually probe with a Q-tip. I am still not certain as to why Dr. Randie Heinz did not open this up entirely when he took the patient to the operating room. We will likely be back in the same situation with just a small superficial opening in a long unhealed tract, as  the open portion is granulating in nicely. 10/02/2021: The patient was initially scheduled for a nurse visit, but we are also applying a total contact cast today. The plantar foot wound looks clean without significant accumulated callus. We have been applying Prisma silver collagen to the site. 10/05/2021: The patient is here for his first total contact cast change. We have tried using gauze packing strips in the tunnel on his lateral leg wound, but this does not seem to be working any better than the white VAC foam. The foot ulcer looks about the same with minimal periwound callus. Medial thigh wound is clean with just some overlying eschar. 10/12/2021: The plantar foot wound is stable without any significant accumulation of periwound callus. The surface is viable with good granulation tissue. The medial thigh wounds are much smaller and are epithelializing. On the other hand, he had purulent drainage coming from the tunnel on his lateral leg. He does go back to see Dr. Randie Heinz next week and is planning to ask him why the wound tunnel was not completely opened at the time of his most recent operation. 10/19/2021: The plantar foot wound is markedly improved and has epithelial tissue coming through the surface. The medial thigh wounds are nearly closed with just a tiny open area. He did see Dr. Randie Heinz earlier this week and apparently they did discuss the possibility of opening the sinus tract further and enabling a wound VAC application. Apparently there are some limits as to what Dr. 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 I think if we could get the tract open to the level of the popliteal fossa, this would greatly aid in her ability to get this chart closed. That being said, however, today when I probed the tract with a Q-tip, I was not able to insert the entirety of the Q-tip as I have on previous occasions. The tunnel is shorter by about 4 cm. The surface is clean  with good granulation tissue and no further episodes of purulent drainage. 10/30/2021: Last week, the patient underwent surgery and had the long tract in his leg opened. There was a rind that was debrided, according to the operative report. His medial thigh ulcers are closed. The plantar foot wound is clean with a good surface and some built up surrounding callus. 11/06/2021: The overall dimensions of the large wound on his lateral leg remain about the same, but there is good granulation tissue present and the tunneling is a little bit shorter. He has a new wound on his anterior tibial surface, in the same location where he had a similar lesion in the past. The plantar foot wound is clean with some buildup surrounding callus. Just toward the medial aspect of his foot, however, there is an area of darkening that once debrided, revealed another opening in the skin surface. 11/13/2021: The anterior tibial surface wound is closed. The plantar foot wound has some surrounding callus buildup. The area of darkening that I debrided last week and revealed an opening in the skin surface has closed again.  The tunnel in the large wound on his lateral leg has come in by about 3 cm. There is healthy granulation tissue on the entire wound surface. 11/23/2021: The patient was out of town last week and did wet-to-dry dressings on his large wound. He says that he rented an Armed forces logistics/support/administrative officer and was ASAH, RAMSIER ([id number]) [phone].pdf Page 15 of 22 able to avoid walking for much of his vacation. Unfortunately, he picked open the wound on his left medial thigh. He says that it was itching and he just could not stop scratching it until it was open again. The wound on his plantar foot is smaller and has not accumulated a tremendous amount of callus. The lateral leg wound is shallower and the tunnel has also decreased in depth. There is just a little bit of slough accumulation on the  surface. 11/30/2021: Another portion of his left medial thigh has been opened up. All of these wounds are fairly superficial with just a little bit of slough and eschar accumulation. The wound on his plantar foot is almost closed with just a bit of eschar and periwound callus accumulation. The lateral leg wound is nearly flush with the surrounding skin and the tunnel is markedly shallower. 12/07/2021: There is just 1 open area on his left medial thigh. It is clean with just a little bit of perimeter eschar. The wound on his plantar foot continues to contract and just has some eschar and periwound callus accumulation. The lateral leg wound is closing at the more distal aspect and the tunnel is smaller. The surface is nearly flush with the surrounding skin and it has a good bed of granulation tissue. 12/14/2021: The thigh and foot wounds are closed. The lateral leg wound has closed over approximately half of its length. The tunnel continues to contract and the surface is now flush with the surrounding skin. The wound bed has robust granulation tissue. 12/22/2021: The thigh and foot wounds have reopened. The foot wound has a lot of callus accumulation around and over it. The thigh wound is tiny with just a little bit of slough in the wound bed. The lateral leg wound continues to contract. His vascular surgeon took the wound VAC off earlier in the week and the patient has been doing wet-to-dry dressings. There is a little slough accumulation on the surface. The tunnel is about 3 cm in depth at this point. 12/28/2021: The thigh wound is closed again. The foot wound has some callus that subsequently has peeled back exposing just a small slit of a wound. The lateral leg wound Is down to about half the size that it originally was and the tunnel is down to about half a centimeter in depth. 01/04/2022: The thigh wound remains closed. The foot wound has heavy callus overlying the wound site. Once this was debrided, the  wound was found to be closed. The lateral leg wound is smaller again this week and very superficial. No tunnel could be identified. 01/12/2022: The thigh and foot wounds both remain closed. The lateral leg wound is now nearly flush with the skin surface. There is good granulation tissue present with a light layer of slough. 01/19/2022: Due to the way his wrap was placed, the patient did not change the dressing on his thigh at all and so the foam was saturated and his skin is macerated. There is a light layer of slough on the wound surface. The underlying granulation tissue is robust and healthy-appearing. He has heavy callus buildup at  the site of his first metatarsal head wound which is still healed. 02/01/2022: He has been in silver alginate. When he removed the dressing from his thigh wound, however, some leg, superficially reopening a portion of the wound that had healed. In addition, underneath the callus at his left first metatarsal head, there appears to be a blister and the wound appears to be open again. 02/08/2022: The lateral leg wound has contracted substantially. There is eschar and a light layer of slough present. He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

## 2023-02-15 ENCOUNTER — Ambulatory Visit (HOSPITAL_COMMUNITY)
Admission: EM | Admit: 2023-02-15 | Discharge: 2023-02-15 | Disposition: A | Discharge status: Home / Self Care | Payer: Medicare Other | Attending: Physician Assistant | Admitting: Physician Assistant

## 2023-02-15 ENCOUNTER — Ambulatory Visit (INDEPENDENT_AMBULATORY_CARE_PROVIDER_SITE_OTHER): Payer: Medicare Other

## 2023-02-15 ENCOUNTER — Encounter (HOSPITAL_COMMUNITY): Payer: Self-pay

## 2023-02-15 DIAGNOSIS — J440 Chronic obstructive pulmonary disease with acute lower respiratory infection: Secondary | ICD-10-CM

## 2023-02-15 DIAGNOSIS — J209 Acute bronchitis, unspecified: Secondary | ICD-10-CM

## 2023-02-15 MED ORDER — ALBUTEROL SULFATE HFA 108 (90 BASE) MCG/ACT IN AERS: 1.0000 | INHALATION_SPRAY | Freq: Four times a day (QID) | RESPIRATORY_TRACT | 1 refills | Status: AC | PRN

## 2023-02-15 MED ORDER — SPIRIVA RESPIMAT 2.5 MCG/ACT IN AERS: 2.0000 | INHALATION_SPRAY | Freq: Every day | RESPIRATORY_TRACT | 0 refills | Status: AC

## 2023-02-15 MED ORDER — BENZONATATE 100 MG PO CAPS: 100.0000 mg | ORAL_CAPSULE | Freq: Three times a day (TID) | ORAL | 0 refills | Status: AC

## 2023-02-15 MED ORDER — IPRATROPIUM-ALBUTEROL 0.5-2.5 (3) MG/3ML IN SOLN
RESPIRATORY_TRACT | Status: AC
  Filled 2023-02-15: qty 3

## 2023-02-15 MED ORDER — FLUTICASONE PROPIONATE 50 MCG/ACT NA SUSP
1.0000 | Freq: Every day | NASAL | 0 refills | Status: AC
Start: 2023-02-15 — End: ?

## 2023-02-15 MED ORDER — IPRATROPIUM-ALBUTEROL 0.5-2.5 (3) MG/3ML IN SOLN
3.0000 mL | Freq: Once | RESPIRATORY_TRACT | Status: AC
  Administered 2023-02-15: 3 mL via RESPIRATORY_TRACT

## 2023-02-15 MED ORDER — DOXYCYCLINE HYCLATE 100 MG PO CAPS: 100.0000 mg | ORAL_CAPSULE | Freq: Two times a day (BID) | ORAL | 0 refills | Status: DC

## 2023-02-15 MED ORDER — CETIRIZINE HCL 5 MG PO TABS: 5.0000 mg | ORAL_TABLET | Freq: Every evening | ORAL | 0 refills | Status: AC | PRN
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
## 2023-02-15 NOTE — Discharge Instructions (Addendum)
Your x-ray looked good with no evidence of pneumonia.  I suspect that you have bronchitis likely triggered from a virus.  Given you just took Augmentin start doxycycline 100 mg twice daily for 7 days.  Stay out of the sun while on this medication.  Use the albuterol inhaler every 4-6 hours as needed.  Use Spiriva daily.  I have called in several allergy medications (cetirizine and fluticasone) as well as a cough medicine (Tessalon) to help manage your symptoms.  You can use over-the-counter medications including Mucinex, Tylenol, nasal saline/sinus rinses.  Follow-up with your primary care soon as possible.  If anything worsens and you have worsening cough, shortness of breath, fever, chest pain, nausea/vomiting, weakness you need to be seen immediately.

## 2023-02-15 NOTE — ED Triage Notes (Signed)
Sx x 1 week.   Cough,chest congestion, nasal congestion.   Wound care had him on a abx Augmentin that he finished Wednesday.

## 2023-02-19 NOTE — Progress Notes (Signed)
[name] ([id number]) [id number] Page 1 of 20 Visit Report for 01/03/2023 Chief Complaint Document Details Patient Name: Date of Service: [name] 01/03/2023 10:15 A M Medical Record Number: [id number] Patient Account Number: [id number] Date of Birth/Sex: Treating RN: August 21, 1950 (72 y.o. M) Primary Care Provider: [name] Other Clinician: Referring Provider: Treating Provider/Extender: [name] in Treatment: 76 Information Obtained from: Patient Chief Complaint Left leg and foot ulcers 04/12/2021; patient is here for wounds on his left lower leg and left plantar foot over the first metatarsal head Electronic Signature(s) Signed: 01/03/2023 10:56:58 AM By: [name] MD FACS Entered By: [name] on 01/03/2023 10:56:58 -------------------------------------------------------------------------------- Debridement Details Patient Name: Date of Service: [name]. 01/03/2023 10:15 A M Medical Record Number: [id number] Patient Account Number: [id number] Date of Birth/Sex: Treating RN: Sep 23, 1950 (72 y.o. [name] Primary Care Provider: [name] Other Clinician: Referring Provider: Treating Provider/Extender: [name] in Treatment: 90 Debridement Performed for Assessment: Wound #18R Left,Plantar Metatarsal head first Performed By: Physician [name], MD Debridement Type: Debridement Severity of Tissue Pre Debridement: Fat layer exposed Level of Consciousness (Pre-procedure): Awake and Alert Pre-procedure Verification/Time Out Yes - 10:39 Taken: Start Time: 10:40 Pain Control: Lidocaine 4% Topical Solution Percent of Wound Bed Debrided: 150% T Area Debrided (cm): otal 2.64 Tissue and other material debrided: Non-Viable, Callus, Slough, Slough Level: Non-Viable Tissue Debridement Description: Selective/Open Wound Instrument: Curette Bleeding:  Minimum Hemostasis Achieved: Pressure End Time: 10:42 Procedural Pain: 0 Post Procedural Pain: 0 Response to Treatment: Procedure was tolerated well Level of Consciousness (Post- Awake and Alert procedure): Post Debridement Measurements of Total Wound Length: (cm) 1.4 Width: (cm) 1.6 Depth: (cm) 0.2 Volume: (cm) 0.352 [name] ([id number]) [id number] Page 2 of 20 Character of Wound/Ulcer Post Debridement: Improved Severity of Tissue Post Debridement: Fat layer exposed Post Procedure Diagnosis Same as Pre-procedure Notes scribed for Dr. Lady Gary by Brenton Grills, RN Electronic Signature(s) Signed: 01/17/2023 3:34:47 PM By: [name] MD FACS Signed: 02/19/2023 2:05:12 PM By: Brenton Grills Previous Signature: 01/03/2023 10:56:48 AM Version By: [name] MD FACS Entered By: Brenton Grills on 01/16/2023 16:59:31 -------------------------------------------------------------------------------- HPI Details Patient Name: Date of Service: [name]. 01/03/2023 10:15 A M Medical Record Number: [id number] Patient Account Number: [id number] Date of Birth/Sex: Treating RN: 03-01-1951 (72 y.o. M) Primary Care Provider: [name] Other Clinician: Referring Provider: Treating Provider/Extender: [name] in Treatment: 77 History of Present Illness HPI Description: 10/11/17; [name] is a 72 year old man who tells me that in 2015 he slipped down the latter traumatizing his left leg. He developed a wound in the same spot the area that we are currently looking at. He states this closed over for the most part although he always felt it was somewhat unstable. In 2016 he hit the same area with the door of his car had this reopened. He tells me that this is never really closed although sometimes an inflow it remains open on a constant basis. He has not been using any specific dressing to this except for topical antibiotics  the nature of which were not really sure. His primary doctor did send him to see Dr. Jacinto Halim of interventional cardiology. He underwent an angiogram on 08/06/17 and he underwent a PTA and directional atherectomy of the lesser distal SFA and popliteal arteries which resulted in brisk improvement in blood flow. It was noted that he had 2 vessel runoff through the anterior tibial  and peroneal. He is also been to see vascular and interventional radiologist. He was not felt to have any significant superficial venous insufficiency. Presumably is not a candidate for any ablation. It was suggested he come here for wound care. The patient is a type II diabetic on insulin. He also has a history of venous insufficiency. ABIs on the left were noncompressible in our clinic 10/21/17; patient we admitted to the clinic last week. 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 We put Iodosorb on him after an aggressive debridement and 3 layer compression. He complained of pain in his ankle and itching with is skin in fact he scratched the area on the medial calf superiorly at the rim of our wraps and he has 2 small open areas in that location today which are new. I changed his primary dressing today to silver collagen. As noted he is already had revascularization and does not have any significant superficial venous insufficiency that would be amenable to ablation 10/28/17; patient admitted to the clinic 2 weeks ago. He has a smaller Wound. Scratch injury from last week revealed. There is large wound over the tibial area. This is smaller. Granulation looks healthy. No need for debridement. 11/04/17; the wound on the left lateral calf looks better. Improved dimensions. Surface of this looks better. We've been maintaining him and Kerlix Coban wraps. He finds this much more comfortable. Silver collagen dressing 11/11/17; left lateral Wound continues to look healthy be  making progress. Using a #5 curet I removed removed nonviable skin from the surface of the wound and then necrotic debris from the wound surface. Surface of the wound continues to look healthy. He also has an open area on the left great toenail bed. We've been using topical antibiotics. 11/19/17; left anterior lateral wound continues to look healthy but it's not closed. He also had a small wound above this on the left leg Initially traumatic wounds in the setting of significant chronic venous insufficiency and stasis dermatitis 11/25/17; left anterior wounds superiorly is closed still a small wound inferiorly. 12/02/17; left anterior tibial area. Arrives today with adherent callus. Post debridement clearly not completely closed. Hydrofera Blue under 3 layer compression. 12/09/17; left anterior tibia. Circumferential eschar however the wound bed looks stable to improved. We've been using Hydrofera Blue under 3 layer compression 12/17/17; left anterior tibia. Apparently this was felt to be closed however when the wrap was taken off there is a skin tear to reopen wounds in the same area we've been using Hydrofera Blue under 3 layer compression 12/23/17 left anterior tibia. Not close to close this week apparently the [facility] was stuck to this again. Still circumferential eschar requiring debridement. I put a contact layer on this this time under the Hydrofera Blue 12/31/17; left anterior tibia. Wound is better slight amount of hyper-granulation. Using Hydrofera Blue over Adaptic. 01/07/18; left anterior tibia. The wound had some surface eschar however after this was removed he has no open wound.he was already revascularized by Dr. Jacinto Halim when he came to our clinic with atherectomy of the left SFA and popliteal artery. He was also sent to interventional radiology for venous reflux studies. He was not felt to have significant reflux but certainly has chronic venous changes of his skin with hemosiderin  deposition around this area. He will definitely need to lubricate his skin and wear compression stocking and I've talked to him about this. READMISSION 05/26/2018 This is  a now 72 year old man we cared for with traumatic wounds on his left anterior lower extremity. He had been previously revascularized during that admission by Dr. Jacinto Halim. Apparently in follow-up Dr. Jacinto Halim noted that he had deterioration in his arterial status. He underwent a stent placement in the distal left ARPIT, BASHIR ([id number]) [id number] Page 3 of 20 SFA on 04/22/2018. 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 He went back to the angiography suite on 04/30/2018 he underwent PTA and balloon angioplasty of the occluded left mid anterior tibial artery, thrombotic occlusion went from 100 to 0% which reconstitutes the posterior tibial artery. He had thrombectomy and aspiration of the peroneal artery. The stent placed in the distal SFA left SFA was still occluded. 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 He has been recuperating at [facility] in [facility] after the most recent hospitalization. He is going home tomorrow. He tells me that 2 weeks ago he traumatized the tip of his left fifth toe. He came in urgently for our review of this. This was a history of before I noted that Dr. Jacinto Halim had already noted dry gangrenous changes of the left fifth toe 06/09/2018; 2-week follow-up. I did contact Dr. Jacinto Halim after his last appointment and he apparently saw 1 of Dr. Verl Dicker colleagues the next day. He does not follow-up with Dr. Jacinto Halim himself until Thursday of this week. 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  Nevertheless there is no evidence of infection no drainage and no pain. He had a new area that this week when we were signing him in today on the left anterior mid tibia area, this is in close proximity to the previous wound we have dealt with in this clinic. 06/23/2018; 2-week follow-up. I did not receive a recent note from Dr. Jacinto Halim to review today. Our office is trying to obtain this. He is apparently not planning to do further vascular interventions and wondered about compression to try and help with the patient's chronic venous insufficiency. However we are also concerned about the arterial flow. He arrives in clinic today with a new area on the left third toe. The areas on the calf/anterior tibia are close to closing. The left fifth toe is still mummified using Betadine. -In reviewing things with the patient he has what sounds like claudication with mild to moderate amount of activity. 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 I prescribed Levaquin over the phone while we attempted to arrange a plan of care. However the patient called yesterday to report he had low-grade fever and he came in today acutely. There is been a marked deterioration in the left third toe with spreading cellulitis up into the dorsal left foot. He was referred to the emergency room. Readmission: 06/29/2020 patient presents today for reevaluation here in our clinic he was previously treated by Dr. Leanord Hawking at the latter part of 2019 in 2 the beginning of 2020. Subsequently we have not seen him since that time in the interim he did have evaluation with vein and vascular specialist specifically Dr. Bo Mcclintock who did perform quite extensive work for a left femoral to anterior tibial artery bypass. With that being  said in the interim the patient has developed significant lymphedema and has wounds that he tells me have really never healed in regard to the incision site on the left leg. He also  has multiple wounds on the feet for various reasons some of which is that he tends to pick at his feet. Fortunately there is no signs of active infection systemically at this time he does have some wounds that are little bit deeper but most are fairly superficial he seems to have good blood flow and overall everything appears to be healthy I see no bone exposed and no obvious signs of osteomyelitis. I do not know that he necessarily needs a x-ray at this point although that something we could consider depending on how things progress. The patient does have a history of lymphedema, diabetes, this is type II, chronic kidney disease stage III, hypertension, and history of peripheral vascular disease. 07/05/2020; patient admitted last week. Is a patient I remember from 2019 he had a spreading infection involving the left foot and we sent him to the hospital. 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 He subsequently had a left femoral to anterior tibial bypass by Dr.Cain vein and vascular. He also has severe lymphedema with chronic skin changes related to that on the left leg. The most problematic area that was new today was on the left medial great toe. This was apparently a small area last week there was purulent drainage which our intake nurse cultured. Also areas on the left medial foot and heel left lateral foot. He has 2 areas on the left medial calf left lateral calf in the setting of the severe lymphedema. 07/13/2020 on evaluation today patient appears to be doing better in my opinion compared to his last visit. The good news is there is no signs of active infection systemically and locally I do not see any signs of infection either. He did have an x-ray which was negative that is great news he had a culture which showed MRSA but at the same time he is been on the doxycycline which has helped. I do think we may want to extend this for 7 additional days 1/25;  patient admitted to the clinic a few weeks ago. 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 We have been putting him under compression his edema control is a lot better but he is severe verricused skin on the left leg. He is really done quite well he still has an open area on the left medial calf and the left medial first metatarsal head. We have been using silver collagen on the leg silver alginate on the foot 07/27/2020 upon evaluation today patient appears to be doing decently well in regard to his wounds. He still has a lot of dry skin on the left leg. Some of this is starting to peel back and I think he may be able to have them out by removing some that today. Fortunately there is no signs of active infection at this time on the left leg although on the right leg he does appear to have swelling and erythema as well as some mild warmth to touch. This does have been concerned about the possibility of cellulitis although within the differential diagnosis I do think that potentially a DVT has to be at least considered. We need to rule that out before proceeding would just call in the cellulitis. Especially since  he is having pain in the posterior aspect of his calf muscle. 2/8; the patient had seen sparingly. He has severe skin changes of chronic lymphedema in the left leg thickened hyperkeratotic verrucous skin. He has an open wound on the medial part of the left first met head left mid tibia. He also has a rim of nonepithelialized skin in the anterior mid tibia. He brought in the AmLactin lotion that was been prescribed although I am not sure under compression and its utility. There concern about cellulitis on the right lower leg the last time he was here. He was put on on antibiotics. His DVT rule out was negative. The right leg looks fine he is using his stocking on this area 08/10/2020 upon evaluation today patient appears to be doing well with regard to  his leg currently. He has been tolerating the dressing changes without complication. Fortunately there is no signs of active infection which is great news. Overall very pleased with where things stand. 2/22; the patient still has an area on the medial part of the left first met his head. This looks better than when I last saw this earlier this month he has a rim of epithelialization but still some surface debris. Mostly everything on the left leg is healed. There is still a vulnerable in the left mid tibia area. 08/30/2020 upon evaluation today patient appears to be doing much better in regard to his wounds on his foot. 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 Nonetheless I think we will address that today I Minna send in a prescription for him in that regard. Overall though there does not appear to be any signs of significant worsening. 09/07/2020 on evaluation today patient's wounds over his left foot appear to be doing excellent. I do not see any signs of infection there is some callus buildup this can require debridement for certain but overall I feel like he is managing quite nicely. He still using the AmLactin cream which has been beneficial for him as well. 3/22; left foot wound is closed. There is no open area here. He is using ammonium lactate lotion to the lower extremities to help exfoliate dry cracked skin. He has compression stockings from elastic therapy in [place]. The wound on the medial part of his left first met head is healed today. READMISSION 04/12/2021 [name] is a patient we know fairly well he had a prolonged stay in clinic in 2019 with wounds on his left lateral and left anterior lower extremity in the setting of chronic venous insufficiency. More recently he was here earlier this year with predominantly an area on his left foot first  metatarsal head plantar and he says the plantar foot broke down on its not long after we discharged him but he did not come back here. The last few months areas of broken down on his left anterior and again the left lateral lower extremity. The leg itself is very swollen chronically enlarged a lot of hyperkeratotic dry Berry Q skin in the left lower leg. His edema extends well into the thigh. He was seen by Dr. Randie Heinz. He had ABIs on 03/02/2021 showing an ABI on the right of 1 with a TBI of 0.72 his ABI in the left at 1.09 TBI of 0.99. Monophasic and biphasic waveforms on the right. On the left monophasic waveforms were noted he  went on to have an angiogram on 03/27/2021 this showed the aortic aortic and iliac segments were free of flow-limiting stenosis the left common femoral vein to evaluate the left femoral to anterior tibial artery bypass was unobstructed the bypass was patent without any areas of stenosis. We discharged the patient in bilateral juxta lite stockings but very clearly that was not sufficient to control the swelling and maintain skin integrity. He is clearly KAYSON, NEWBY ([id number]) [id number] Page 4 of 20 going to need compression pumps. 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 This is fortunate because he is on his feet for long periods of time. 10/27; patient comes in with our intake nurse reporting copious amount of green drainage from the left anterior mid tibia the left dorsal foot and to a lesser extent the left medial mid tibia. We left the compression wrap on all week for the amount of edema in his left leg is quite a bit better. We use silver alginate as the primary dressing 11/3; edema control is good. Left anterior lower leg left medial lower leg and the plantar first metatarsal head. The left anterior lower leg required debridement. Deep tissue culture I did of this wound showed MRSA I put  him on 10 days of doxycycline which she will start today. We have him in compression wraps. He has a security card and [employer] however he is retiring on November 15. We will need to then get him into a better offloading boot for the left foot perhaps a total contact cast 11/10; edema control is quite good. Left anterior and left medial lower leg wounds in the setting of chronic venous insufficiency and lymphedema. He also has a substantial area over the left plantar first metatarsal head. 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 He has significant hypergranulation on the left plantar foot wound. The patient is a diabetic but he does not have significant PAD 11/17; edema control is quite good. Left anterior and left medial lower leg wounds look better. The really concerning area remains the area on the left plantar first metatarsal head. He has a rim of epithelialization. 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 Starting today with a forefoot off loader but . possibly a total contact cast. 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 Arrives with a very significant odor from I think all of the wounds on the left leg and the left foot. Because of this I did not put a total contact cast on him today but will could still consider this. His wife was having cataract surgery which is the reason he missed the appointment 12/6. I saw this man 5 days ago with a swelling below the popliteal fossa. I thought he actually might have a Baker's cyst however the DVT rule out study that we could arrange right away was negative the technician told me this was not a ruptured Baker's cyst. We attempted to get  this aspirated by under ultrasound guidance in interventional radiology however all they did was an ultrasound however it shows an  extensive fluid collection 62 x 8 x 9.4 in the left thigh and left calf. The patient states he thinks this started 8 days ago or so but he really is not complaining of any pain, fever or systemic symptoms. He has not ha 12/20; after some difficulty I managed to get the patient into see Dr. Randie Heinz. Eventually he was taken into the hospital and had a drain put in the fluid collection below his left knee posteriorly extending into the posterior thigh. He still has the drain in place. Culture of this showed moderate staff aureus few Morganella and few Klebsiella he is now on doxycycline and ciprofloxacin as suggested by infectious disease he is on this for a month. The drain will remain in place until it stops draining 12/29; he comes in today with the 1 wound on his left leg and the area on the left plantar first met head significantly smaller. Both look healthy. He still has the drain in the left leg. He says he has to change this daily. Follows up with Dr. Randie Heinz on January 11. 06/29/2021; the wounds that I am following on the left leg and left first met head continued to be quite healthy. However the area where his inferior drain is in place had copious amounts of drainage which was green in color. The wound here is larger. Follows up with Dr. Pascal Lux of vein and vascular his surgeon next week as well as infectious disease. He remains on ciprofloxacin and doxycycline. He is not complaining of excessive pain in either one of the drain areas 1/12; the patient saw vascular surgery and infectious disease. Vascular surgery has left the drain in place as there was still some notable drainage still see him back in 2 weeks. Dr. Dorthula Perfect stop the doxycycline and ciprofloxacin and I do not believe he follows up with them at this point. Culture I did last week showed both doxycycline resistant  MRSA and Pseudomonas not sensitive to ciprofloxacin although only in rare titers 1/19; the patient's wound on the left anterior lower leg is just about healed. We have continued healing of the area that was medially on the left leg. Left first plantar metatarsal head continues to get smaller. The major problem here is his 2 drain sites 1 on the left upper calf and lateral thigh. There is purulent drainage still from the left lateral thigh. I gave him antibiotics last week but we still have recultured. He has the drain in the area I think this is eventually going to have to come out. I suspect there will be a connecting wound to heal here perhaps with improved VAc 1/26; the patient had his drain removed by vein and vascular on 1/25/. This was a large pocket of fluid in his left thigh that seem to tunnel into his left upper calf. 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 His mention his Penrose drain was removed today. He now has a tunneling wound on his left calf and left thigh. Both of these probe widely towards each other although I cannot really prove that they connect. Both wounds on his lower leg anteriorly are closed and his area over the first metatarsal head on his right foot continues to improve. We are using Hydrofera Blue here. He also saw infectious disease culture of the abscess they noted was polymicrobial with MRSA, Morganella and Klebsiella he was treated with doxycycline and ciprofloxacin for 4 weeks ending on 07/03/2021. They did not recommend any further antibiotics.  Notable that while he still had the Penrose drain in place last week he had purulent drainage coming out of the inferior IandD site this grew [place] [facility], MRSA and Pseudomonas but there does not appear to be any active infection in this area today with the drain out and he is not systemically unwell 2/2; with regards to the drain sites the superior one on the thigh actually is closed down the one on the  upper left lateral calf measures about 8 and half centimeters which is an improvement seems to be less prominent although still with a lot of drainage. The only remaining wound is over the first metatarsal head on the left foot and this looks to be continuing to improve with Hydrofera Blue. 2/9; the area on his plantar left foot continues to contract. Callus around the wound edge. The drain sites specifically have not come down in depth. We put the wound VAC on Monday he changed the canister late last night our intake nurse reported a pocket of fluid perhaps caused by our compression wraps 2/16; continued improvement in left foot plantar wound. drainage site in the calf is not improved in terms of depth (wound vac) 2/23; continued improvement in the left foot wound over the first metatarsal head. With regards to the drain sites the area on his thigh laterally is healed however the open area on his calf is small in terms of circumference by still probes in by about 15 cm. Within using the wound VAC. Hydrofera Blue on his foot 08/24/2021: The left first metatarsal head wound continues to improve. The wound bed is healthy with just some surrounding callus. Unfortunately the open drain site on his calf remains open and tunnels at least 15 cm (the extent of a Q-tip). This is despite several weeks of wound VAC treatment. Based on reading back through the notes, there has been really no significant change in the depth of the wound, although the orifice is smaller and the more cranial wound on his thigh has closed. I suspect the tunnel tracks nearly all the way to this location. 08/31/2021: Continued improvement in the left first metatarsal head wound. There has been absolutely no improvement to the long tunnel from his open drain site on his calf. We have tried to get him into see vascular surgery sooner to consider the possibility of simply filleting the tract open and allowing it to heal from the bottom up,  likely with a wound VAC. They have not yet scheduled a sooner appointment than his current mid April 09/14/2021: He was seen by vascular surgery and they took him to the operating room last week. They opened a portion of the tunnel, but did not extend the entire length of the known open subcutaneous tract. I read Dr. Darcella Cheshire operative note and it is not clear from that documentation why only a portion of the tract was opened. The heaped up granulation tissue was curetted and removed from at least some portion of the tract. They did place a wound VAC and applied an Unna boot to the leg. The ulcer on his left first metatarsal head is smaller today. The bed looks good and there is just a small amount of surrounding callus. 09/21/2021: The ulcer on his left first metatarsal head looks to be stalled. There is some callus surrounding the wound but the wound bed itself does not appear DYAMI, MAMONE ([id number]) [id number] Page 5 of 20 particularly dynamic. The tunnel tract on his lateral left leg seems  to be roughly the same length or perhaps slightly smaller but the wound bed appears healthy with good granulation tissue. He opened up a new wound on his medial thigh and the site of a prior surgical incision. He says that he did this unconsciously in his sleep by scratching. 09/28/2021: Unfortunately, the ulcer on his left first metatarsal head has extended underneath the callus toward the dorsum of his foot. The medial thigh wounds are roughly the same. The tunnel on his lateral left leg continues to be problematic; it is longer than we are able to actually probe with a Q-tip. I am still not certain as to why Dr. Randie Heinz did not open this up entirely when he took the patient to the operating room. We will likely be back in the same situation with just a small superficial opening in a long unhealed tract, as the open portion is granulating in nicely. 10/02/2021: The patient was initially  scheduled for a nurse visit, but we are also applying a total contact cast today. The plantar foot wound looks clean without significant accumulated callus. We have been applying Prisma silver collagen to the site. 10/05/2021: The patient is here for his first total contact cast change. We have tried using gauze packing strips in the tunnel on his lateral leg wound, but this does not seem to be working any better than the white VAC foam. The foot ulcer looks about the same with minimal periwound callus. Medial thigh wound is clean with just some overlying eschar. 10/12/2021: The plantar foot wound is stable without any significant accumulation of periwound callus. The surface is viable with good granulation tissue. The medial thigh wounds are much smaller and are epithelializing. On the other hand, he had purulent drainage coming from the tunnel on his lateral leg. He does go back to see Dr. Randie Heinz next week and is planning to ask him why the wound tunnel was not completely opened at the time of his most recent operation. 10/19/2021: The plantar foot wound is markedly improved and has epithelial tissue coming through the surface. The medial thigh wounds are nearly closed with just a tiny open area. He did see Dr. Randie Heinz earlier this week and apparently they did discuss the possibility of opening the sinus tract further and enabling a wound VAC application. Apparently there are some limits as to what Dr. 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 I think if we could get the tract open to the level of the popliteal fossa, this would greatly aid in her ability to get this chart closed. That being said, however, today when I probed the tract with a Q-tip, I was not able to insert the entirety of the Q-tip as I have on previous occasions. The tunnel is shorter by about 4 cm. The surface is clean with good granulation tissue and no further episodes of purulent  drainage. 10/30/2021: Last week, the patient underwent surgery and had the long tract in his leg opened. There was a rind that was debrided, according to the operative report. His medial thigh ulcers are closed. The plantar foot wound is clean with a good surface and some built up surrounding callus. 11/06/2021: The overall dimensions of the large wound on his lateral leg remain about the same, but there is good granulation tissue present and the tunneling is a little bit shorter. He has a new wound on his anterior tibial surface, in the same location where he had a similar lesion in  the past. The plantar foot wound is clean with some buildup surrounding callus. Just toward the medial aspect of his foot, however, there is an area of darkening that once debrided, revealed another opening in the skin surface. 11/13/2021: The anterior tibial surface wound is closed. The plantar foot wound has some surrounding callus buildup. The area of darkening that I debrided last week and revealed an opening in the skin surface has closed again. The tunnel in the large wound on his lateral leg has come in by about 3 cm. There is healthy granulation tissue on the entire wound surface. 11/23/2021: The patient was out of town last week and did wet-to-dry dressings on his large wound. 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 Unfortunately, he picked open the wound on his left medial thigh. He says that it was itching and he just could not stop scratching it until it was open again. The wound on his plantar foot is smaller and has not accumulated a tremendous amount of callus. The lateral leg wound is shallower and the tunnel has also decreased in depth. There is just a little bit of slough accumulation on the surface. 11/30/2021: Another portion of his left medial thigh has been opened up. All of these wounds are fairly superficial with just a little bit of  slough and eschar accumulation. The wound on his plantar foot is almost closed with just a bit of eschar and periwound callus accumulation. The lateral leg wound is nearly flush with the surrounding skin and the tunnel is markedly shallower. 12/07/2021: There is just 1 open area on his left medial thigh. It is clean with just a little bit of perimeter eschar. The wound on his plantar foot continues to contract and just has some eschar and periwound callus accumulation. The lateral leg wound is closing at the more distal aspect and the tunnel is smaller. The surface is nearly flush with the surrounding skin and it has a good bed of granulation tissue. 12/14/2021: The thigh and foot wounds are closed. The lateral leg wound has closed over approximately half of its length. The tunnel continues to contract and the surface is now flush with the surrounding skin. The wound bed has robust granulation tissue. 12/22/2021: The thigh and foot wounds have reopened. The foot wound has a lot of callus accumulation around and over it. The thigh wound is tiny with just a little bit of slough in the wound bed. The lateral leg wound continues to contract. His vascular surgeon took the wound VAC off earlier in the week and the patient has been doing wet-to-dry dressings. There is a little slough accumulation on the surface. The tunnel is about 3 cm in depth at this point. 12/28/2021: The thigh wound is closed again. The foot wound has some callus that subsequently has peeled back exposing just a small slit of a wound. The lateral leg wound Is down to about half the size that it originally was and the tunnel is down to about half a centimeter in depth. 01/04/2022: The thigh wound remains closed. The foot wound has heavy callus overlying the wound site. Once this was debrided, the wound was found to be closed. The lateral leg wound is smaller again this week and very superficial. No tunnel could be identified. 01/12/2022: The  thigh and foot wounds both remain closed. The lateral leg wound is now nearly flush with the skin surface. There is good  granulation tissue present with a light layer of slough. 01/19/2022: Due to the way his wrap was placed, the patient did not change the dressing on his thigh at all and so the foam was saturated and his skin is macerated. There is a light layer of slough on the wound surface. The underlying granulation tissue is robust and healthy-appearing. He has heavy callus buildup at the site of his first metatarsal head wound which is still healed. 02/01/2022: He has been in silver alginate. When he removed the dressing from his thigh wound, however, some leg, superficially reopening a portion of the wound that had healed. In addition, underneath the callus at his left first metatarsal head, there appears to be a blister and the wound appears to be open again. 02/08/2022: The lateral leg wound has contracted substantially. There is eschar and a light layer of slough present. He says that it is starting to pull and is uncomfortable. On inspection, there is some puckering of the scar and the eschar is quite dry; this may account for his symptoms. On his first metatarsal head, the wound is much smaller with just some eschar on the surface. The callus has not reaccumulated. He reports that he had a blister come up on his medial thigh wound at the distal aspect. It popped and there is now an opening in his skin again. Looking back through his library of wound photos, there is what looks like a permanent suture just deep to this location and it may be trying to erode through. We have been using silver alginate on his wounds. 02/15/2022: The lateral leg wound is about half the size it was last week. It is clean with just a little perimeter eschar and light slough. The wound on his first metatarsal head is about the same with heavy callus overlying it. The medial thigh wound is closed again. He does have  some skin changes on the top of his foot that looks potentially yeast related. 02/22/2022: The skin on the top of his foot improved with the use of a topical antifungal. The lateral leg wound continues to contract and is again smaller this week. There is a little bit of slough and eschar on the surface. The first metatarsal head wound is a little bit smaller but has reaccumulated a thick callus over the top. He decided to try to trim his toenail and ultimately took the entire nail off of his left great toe. 03/02/2022: His lateral leg wound continues to improve, as does the wound on his left great toe. Unfortunately, it appears that somehow his foot got wet and moisture seeped in through the opening causing his skin to lift. There is a large wound now overlying his first metatarsal on both the plantar, medial, and dorsal portion of his foot. There is necrotic tissue and slough present underneath the shaggy macerated skin. ABRAHIM, NORDAN ([id number]) [id number] Page 6 of 20 03/08/2022: The lateral leg wound is smaller again today. There is just a light layer of slough and eschar on the surface. The great toe wound is smaller again today. The first metatarsal wound is a little bit smaller today and does not look nearly as necrotic and macerated. There is still slough and nonviable tissue present. 03/15/2022: The lateral leg wound is narrower and just has a little bit of light slough buildup. The first metatarsal wound still has a fair amount of moisture affecting the periwound skin. The great toe wound is healed. 03/22/2022: The lateral leg wound  is now isolated to just at the level of his knee. There is some eschar and slough accumulation. The first metatarsal head wound has epithelialized tremendously and is about half the size that it was last week. He still has some maceration on the top of his foot and a fungal odor is present. 03/29/2022: T oday the patient's foot was  macerated, suggesting that the cast got wet. The patient has also been picking at his dry skin and has enlarged the wound on his left lateral leg. In the time between having his cast removed and my evaluation, he had picked more dry skin and opened up additional wounds on his Achilles area and dorsal foot. The plantar first metatarsal head wound, however, is smaller and clean with just macerated callus around the perimeter and light slough on the surface. The lateral leg wound measured a little bit larger but is also fairly clean with eschar and minimal slough. 04/02/2022: The patient had vascular studies done last Friday and so his cast was not applied. He is here today to have that done. 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 04/05/2022: Both wounds are smaller and quite clean. There is just a little biofilm on the lateral leg wound. 10/20; the patient has a wound on the left lateral surgical incision at the level of his lateral knee this looks clean and improved. He is using silver alginate. He also has an area on his left medial foot for which she is using Hydrofera Blue under a total contact cast both wounds are measuring smaller 04/20/2022: The plantar foot wound has contracted considerably and is very close to closing. The lateral leg wound was measured a little larger, but there was a tiny open area that was included in the measurements that was not included last week. He has some eschar around the perimeter but otherwise the wound looks clean. 04/27/2022: The lateral leg wound looks better this week. He says that midweek, he felt it was very dry and began applying hydrogel to the site. I think this was beneficial. The foot wound is nearly closed underneath a thick layer of dry skin and callus. 05/04/2022: The foot wound is healed. He has developed a new small ulcer on his anterior tibial surface about midway up his leg. It has a little slough on the surface. The lateral leg wound  still is fairly dry, but clean with just a little biofilm on the surface. 05/11/2022: The wound on his foot reopened on Wednesday. A large blister formed which then broke open revealing the fat layer underneath. The ulcer on his anterior tibial surface is a little bit larger this week. The lateral leg wound has much better moisture balance this week. Fortunately, prior to his foot wound reopening, he did get the cast made for his orthotic. 05/15/2022: Already, the left medial foot wound has improved. The tissue is less macerated and the surface is clean. The ulcer on his anterior tibial surface continues to enlarge. This seems likely secondary to accumulated moisture. The lateral leg wound continues to have an improved moisture balance with the use of collagen. 05/25/2022: The medial foot wound continues to contract. It is now substantially smaller with just a little slough on the surface. The anterior tibial surface wound continues to enlarge further. Once again, this seems to be secondary to moisture. The lateral leg wound does not seem to be changing much in size, but the moisture balance is better. 06/01/2022: The anterior tibial wound is closed.  The medial foot wound is down to just a very small, couple of millimeters, opening. The lateral leg wound has good moisture balance, but remains unchanged in size. 12/15; the patient's anterior tibial wound has reopened, however the area on his right first metatarsal head is closed. The major wound is actually on the superior part of his surgical wound in the left lateral thigh. Not a completely viable surface under illumination. This may at some point require a debridement I think he is currently using Prisma. As noted the left medial foot wound has closed 06/14/2022: The anterior tibial wound has closed. The lateral leg wound has a better surface but is basically unchanged in size. The left medial foot wound has reopened. It looks as though there was  some callus accumulation and moisture got under the callus which caused the tissue to break down again. 06/21/2022: A new wound has opened up just distal to the previous anterior tibial wound. It is small but has hypertrophic granulation tissue present. The lateral leg wound is a little bit narrower and has a layer of slough on the surface. The left medial foot wound is down to just a pinhole. His custom orthotics should be available next week. 06/28/2022: The wound on his first metatarsal head has healed. He has developed a new small wound on his medial lower leg, in an old scar site. The lateral leg wound continues to contract but continues to accumulate slough, as well. 07/03/2022: Despite wearing his custom orthopedic shoes, he managed to reopen the wound on his first metatarsal head. He says he thinks his foot got wet and then some skin lifted up and he peeled this away. Both of the lower leg wounds are smaller and have some dry eschar on the surface. The lateral leg wound is quite a bit narrower today. 07/12/2022: The medial lower leg wound is closed. The anterior lower leg wound has contracted considerably. The lateral upper leg wound is narrower with a layer of slough on the surface. The first metatarsal head wound is also smaller, but had copious drainage which saturated the foam border dressing and resulted in some periwound tissue maceration. Fortunately there was no breakdown at this site. 07/19/2022: The lower leg shows signs of significant maceration; I think he must be sweating excessively inside his cast. There are several areas of skin breakdown present. The wound on his foot is smaller and that on his lateral leg is narrower and is shorter by about a centimeter. 07/26/2022: Last week we used a zinc Coflex wrap prior to applying his total contact cast and this has had the effect of keeping his skin from getting macerated this week. The anterior leg wound has epithelialized substantially. The  lateral leg wound is significantly smaller with just a bit of slough on the surface. The first metatarsal head wound is also smaller this week. 08/02/2022: The anterior leg wound was closed on arrival, but while he was sitting in the room, he picked it open again. The lateral leg wound is smaller with just a little slough on the surface and the first metatarsal head wound has contracted further, as well. 08/09/2022: The first metatarsal head wound is covered with callus. Underneath the callus, it is nearly completely closed. The lateral leg wound is smaller again this week. The anterior leg wound looks better, but he has such heavy buildup of old skin, that moisture is getting underneath this, becoming trapped, and causing the underlying skin to get macerated and open up. 08/16/2022:  The first metatarsal head wound is closed. The lateral leg wound continues to contract and is quite a bit smaller again this week. There is just a small, superficial opening remaining on his anterior tibial surface. 08/23/2022: The first metatarsal head wound has, by some miracle, remained closed. The lateral leg wound is substantially smaller with multiple areas of epithelialization. The anterior tibial surface wound is also quite a bit smaller and very clean. MONTY, CALIENDO ([id number]) [id number] Page 7 of 20 08/30/2022: Unfortunately, his first metatarsal head wound opened up again. It happened in the same fashion as it has on prior occasions. Moisture got under dried skin/callus and created a wound when he removed his sock, taking the skin with it. The anterior tibial surface has a thick shell of hyperkeratotic skin. This has been contributing to ongoing repeat wounding events as moisture gets underneath this and causes tissue breakdown. 3/15; patient presents for follow-up. His anterior left leg wound has healed. He still has the wound to the left lateral aspect and left first met head. We  have been using silver alginate and endoform to these areas under [employer]. He has no issues or complaints today. He has been taking Augmentin and reports improvement to his symptoms to the left first met head. 09/13/2022: He has accumulated more thick dry skin in sheets on his lower leg. The lateral leg wound is about the same size and the left first metatarsal head wound is a little bit smaller. There is slough on both surfaces. There is callus buildup around the foot wound. 09/20/2022: The lateral leg wound is a little bit narrower and the left first metatarsal head wound also seems to have contracted slightly. There is slough on both surfaces. He has a little skin breakdown on his anterior tibial surface. 09/27/2022: The lateral leg wound continues to contract and is quite clean. The first metatarsal head wound is also smaller. There is some perimeter callus and slough accumulation on the foot. The anterior tibial surface is closed. 10/04/2022: Both of his wounds are smaller today, particularly the first metatarsal head wound. 10/18/2022: He missed his appointment last week and ended up cutting off his wrap on Saturday. The anterior tibial wound reopened. It is fairly superficial with a little bit of slough on the surface. His lateral leg wound is smaller with some slough and eschar buildup. The first metatarsal head wound is also smaller with some callus and slough accumulation. 10/25/2022: All wounds are smaller. There is slough and eschar on the lateral leg and slough and callus on the plantar foot wound. The anterior tibial wound is clean and flush with the surrounding skin. No debris accumulation here. 11/01/2022: The wounds are all smaller again this week. There is slough on the lateral leg and some minimal slough and eschar on the anterior tibial wound. There is callus accumulation on the first metatarsal head site, along with slough. There is also a yeasty odor coming from the foot. 11/08/2022:  The lateral leg wound is smaller except where he picked some skin while waiting to be seen. There is a little bit of slough on the surface. The anterior tibial wound is closed. The first metatarsal head site has gotten macerated once again and has a lot of spongy wet tissue and callus around it. No yeast odor today. 11/15/2022: The lateral leg wound continues to contract. There is now a band of epithelium dividing it into 2 areas. Minimal slough accumulation. He managed to pick open a new wound  on his medial lower leg. The first metatarsal head site is smaller with some callus accumulation but no tissue maceration. 11/22/2022: The lateral leg wound is smaller again by about a third today. There is a little bit of slough on the surface with some periwound eschar. The new wound that he picked open last week has healed but he picked open 3 new small wounds on his anterior tibial surface, once again due to his picking at his skin. The first metatarsal head wound looks about the same. There has been some callus accumulation and there is more edema present, as he was not put in a compression wrap last week. 11/29/2022: The lateral leg wound is smaller again today. There is a little periwound eschar and some slough present. The wounds on his anterior tibial surface are all closed except for 1 that has a little bit of slough on the open portion with eschar covering it. The first metatarsal head wound measured a little bit smaller today, but mostly looks about the same. Edema control is better. 12/06/2022: The anterior tibial wound is closed. The lateral leg wound is smaller with some slough and eschar accumulation. The plantar foot ulcer is about the same size, but the tissue does show some evidence of the fact that he was on his feet quite a bit more this past week; there was a death in his family. There has been more callus accumulation and the tissues are little bit more purpleish. 12/13/2022: He picked the skin  on his anterior tibia and reopened a wound there while he was waiting to be seen in clinic. The lateral leg wound is much smaller with a little bit of slough and eschar. The plantar foot ulcer is smaller today with callus and slough buildup, but without the pressure induced tissue injury that was seen last week. 12/20/2022: The anterior tibial wound has healed. The lateral leg wound is smaller again this week. There is a little bit of eschar buildup on the surface. The foot had a fairly strong odor coming from it that persisted even after washing. The wound itself looks like its gotten a little smaller but he has built up thick callus, once again. 12/26/2022: The lateral leg wound is down to just a narrow superficial slit. There is slough and a little bit of dry skin present. The foot is in much better shape today. There is less callus accumulation and no odor. The skin edges are starting to roll inward, however. 01/03/2023: The lateral leg wound is healed. The skin is quite dry but the wound has closed. The foot continues to contract. He has a fair amount of periwound callus accumulation and the surface of the is a little drier than ideal. Electronic Signature(s) Signed: 01/03/2023 10:58:04 AM By: [name] MD FACS Entered By: [name] on 01/03/2023 10:58:04 -------------------------------------------------------------------------------- Physical Exam Details Patient Name: Date of Service: [name]. 01/03/2023 10:15 A M Medical Record Number: [id number] Patient Account Number: [id number] Date of Birth/Sex: Treating RN: [birth year] (72 y.o. M) Primary Care Provider: [name] Other Clinician: Referring Provider: Treating Provider/Extender: [name] in Treatment: [address] [place] ([id number]) [id number] Page 8 of 20 Constitutional . . . . no acute distress. Respiratory Normal work of breathing on room  air. Notes 01/03/2023: The lateral leg wound is healed. The skin is quite dry but the wound has closed. The foot continues to contract. He has a fair amount of periwound callus accumulation and the surface of  the is a little drier than ideal. Electronic Signature(s) Signed: 01/03/2023 10:58:47 AM By: [name] MD FACS Entered By: [name] on 01/03/2023 10:58:47 -------------------------------------------------------------------------------- Physician Orders Details Patient Name: Date of Service: [name]. 01/03/2023 10:15 A M Medical Record Number: [id number] Patient Account Number: [id number] Date of Birth/Sex: Treating RN: March 15, 1951 (72 y.o. M) Primary Care Provider: [name] Other Clinician: Referring Provider: Treating Provider/Extender: [name] in Treatment: 45 Verbal / Phone Orders: No Diagnosis Coding Follow-up Appointments ppointment in 1 week. - Dr Lady Gary - Room 2 Return A Anesthetic (In clinic) Topical Lidocaine 4% applied to wound bed Bathing/ Shower/ Hygiene May shower with protection but do not get wound dressing(s) wet. Protect dressing(s) with water repellant cover (for example, large plastic bag) or a cast cover and may then take shower. Edema Control - Lymphedema / SCD / Other Elevate legs to the level of the heart or above for 30 minutes daily and/or when sitting for 3-4 times a day throughout the day. Avoid standing for long periods of time. Patient to wear own compression stockings every day. - both legs daily Moisturize legs daily. Compression stocking or Garment 20-30 mm/Hg pressure to: Off-Loading Wound #18R Left,Plantar Metatarsal head first Open toe surgical shoe to: - Front off loader Left ft Other: - minimal weight bearing left foot Additional Orders / Instructions Follow Nutritious Diet - vitamin C 500 mg 3 times a day and zinc 30-50 mg a day Wound Treatment Wound #18R - Metatarsal head first Wound  Laterality: Plantar, Left Cleanser: Soap and Water 1 x Per Week/30 Days Discharge Instructions: May shower and wash wound with dial antibacterial soap and water prior to dressing change. Cleanser: Wound Cleanser 1 x Per Week/30 Days Discharge Instructions: Cleanse the wound with wound cleanser prior to applying a clean dressing using gauze sponges, not tissue or cotton balls. Peri-Wound Care: Ketoconazole Cream 2% 1 x Per Week/30 Days Discharge Instructions: Apply Ketoconazole as directed Topical: Gentamicin 1 x Per Week/30 Days Discharge Instructions: As directed by physician Topical: Mupirocin Ointment 1 x Per Week/30 Days Discharge Instructions: Apply Mupirocin (Bactroban) as instructed SONNI, BISCOE ([id number]) [id number] Page 9 of 20 Prim Dressing: Promogran Prisma Matrix, 4.34 (sq in) (silver collagen) 1 x Per Week/30 Days ary Discharge Instructions: Moisten collagen with saline or hydrogel Secondary Dressing: Optifoam Non-Adhesive Dressing, 4x4 in (Generic) 1 x Per Week/30 Days Discharge Instructions: Apply over primary dressing as directed. Secondary Dressing: Woven Gauze Sponges 2x2 in (Generic) 1 x Per Week/30 Days Discharge Instructions: Apply over primary dressing as directed. Secondary Dressing: Zetuvit Absorbent Pad, 4x4 (in/in) 1 x Per Week/30 Days Compression Wrap: Urgo K2, (equivalent to a 4 layer) two layer compression system, regular 1 x Per Week/30 Days Discharge Instructions: Apply Urgo K2 as directed (alternative to 4 layer compression). Electronic Signature(s) Signed: 01/17/2023 3:34:47 PM By: [name] MD FACS Signed: 02/19/2023 2:05:12 PM By: Brenton Grills Previous Signature: 01/03/2023 12:08:46 PM Version By: [name] MD FACS Entered By: Brenton Grills on 01/16/2023 16:59:52 -------------------------------------------------------------------------------- Problem List Details Patient Name: Date of Service: [name]. 01/03/2023 10:15 A M Medical Record Number: [id number] Patient Account Number: [id number] Date of Birth/Sex: Treating RN: 15-Jul-1950 (72 y.o. M) Primary Care Provider: [name] Other Clinician: Referring Provider: Treating Provider/Extender: [name] in Treatment: 32 Active Problems ICD-10 Encounter Code Description Active Date MDM Diagnosis L97.528 Non-pressure chronic ulcer of other part of left foot with other specified 08/26/2022 No Yes  severity I87.322 Chronic venous hypertension (idiopathic) with inflammation of left lower 04/12/2021 No Yes extremity E11.51 Type 2 diabetes mellitus with diabetic peripheral angiopathy without gangrene 04/12/2021 No Yes I89.0 Lymphedema, not elsewhere classified 04/12/2021 No Yes L85.9 Epidermal thickening, unspecified 08/30/2022 No Yes Inactive Problems ICD-10 Code Description Active Date Inactive Date E11.621 Type 2 diabetes mellitus with foot ulcer 04/12/2021 04/12/2021 L97.828 Non-pressure chronic ulcer of other part of left lower leg with other specified severity 06/08/2022 06/08/2022 [name] ([id number]) [phone] Page 10 of 20 E11.42 Type 2 diabetes mellitus with diabetic polyneuropathy 04/12/2021 04/12/2021 L02.416 Cutaneous abscess of left lower limb 06/13/2021 06/13/2021 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 07/20/2021 07/20/2021 Resolved Problems ICD-10 Code Description Active Date Resolved Date L97.828 Non-pressure chronic ulcer of other part of left lower leg with other specified severity 04/12/2021 04/12/2021 Electronic Signature(s) Signed: 01/03/2023 10:56:03 AM By: [name] MD FACS Entered By: [name] on 01/03/2023 10:56:03 -------------------------------------------------------------------------------- Progress Note Details Patient Name: Date of Service: [name]. 01/03/2023 10:15 A M Medical Record  Number: [id number] Patient Account Number: [id number] Date of Birth/Sex: Treating RN: July 06, 1950 (72 y.o. M) Primary Care Provider: [name] Other Clinician: Referring Provider: Treating Provider/Extender: [name] in Treatment: 63 Subjective Chief Complaint Information obtained from Patient Left leg and foot ulcers 04/12/2021; patient is here for wounds on his left lower leg and left plantar foot over the first metatarsal head History of Present Illness (HPI) 10/11/17; Mr. Rei is a 72 year old man who tells me that in 2015 he slipped down the latter traumatizing his left leg. He developed a wound in the same spot the area that we are currently looking at. He states this closed over for the most part although he always felt it was somewhat unstable. In 2016 he hit the same area with the door of his car had this reopened. He tells me that this is never really closed although sometimes an inflow it remains open on a constant basis. He has not been using any specific dressing to this except for topical antibiotics the nature of which were not really sure. His primary doctor did send him to see Dr. Jacinto Halim of interventional cardiology. He underwent an angiogram on 08/06/17 and he underwent a PTA and directional atherectomy of the lesser distal SFA and popliteal arteries which resulted in brisk improvement in blood flow. It was noted that he had 2 vessel runoff through the anterior tibial and peroneal. He is also been to see vascular and interventional radiologist. He was not felt to have any significant superficial venous insufficiency. Presumably is not a candidate for any ablation. It was suggested he come here for wound care. The patient is a type II diabetic on insulin. He also has a history of venous insufficiency. ABIs on the left were noncompressible in our clinic 10/21/17; patient we admitted to the clinic last week. He has a fairly large chronic ulcer on the left  lateral calf in the setting of chronic venous insufficiency. We put Iodosorb on him after an aggressive debridement and 3 layer compression. He complained of pain in his ankle and itching with is skin in fact he scratched the area on the medial calf superiorly at the rim of our wraps and he has 2 small open areas in that location today which are new. I changed his primary dressing today to silver collagen. As noted he is already had revascularization and does not have any significant superficial venous  insufficiency that would be amenable to ablation 10/28/17; patient admitted to the clinic 2 weeks ago. He has a smaller Wound. Scratch injury from last week revealed. There is large wound over the tibial area. This is smaller. Granulation looks healthy. No need for debridement. 11/04/17; the wound on the left lateral calf looks better. Improved dimensions. Surface of this looks better. We've been maintaining him and Kerlix Coban wraps. He finds this much more comfortable. Silver collagen dressing 11/11/17; left lateral Wound continues to look healthy be making progress. Using a #5 curet I removed removed nonviable skin from the surface of the wound and then necrotic debris from the wound surface. Surface of the wound continues to look healthy. He also has an open area on the left great toenail bed. We've been using topical antibiotics. 11/19/17; left anterior lateral wound continues to look healthy but it's not closed. He also had a small wound above this on the left leg Initially traumatic wounds in the setting of significant chronic venous insufficiency and stasis dermatitis 11/25/17; left anterior wounds superiorly is closed still a small wound inferiorly. 12/02/17; left anterior tibial area. Arrives today with adherent callus. Post debridement clearly not completely closed. Hydrofera Blue under 3 layer compression. 12/09/17; left anterior tibia. Circumferential eschar however the wound bed looks stable to  improved. We've been using Hydrofera Blue under 3 layer DODGER, SHY ([id number]) [id number] Page 11 of 20 compression 12/17/17; left anterior tibia. Apparently this was felt to be closed however when the wrap was taken off there is a skin tear to reopen wounds in the same area we've been using Hydrofera Blue under 3 layer compression 12/23/17 left anterior tibia. Not close to close this week apparently the [facility] was stuck to this again. Still circumferential eschar requiring debridement. I put a contact layer on this this time under the Hydrofera Blue 12/31/17; left anterior tibia. Wound is better slight amount of hyper-granulation. Using Hydrofera Blue over Adaptic. 01/07/18; left anterior tibia. The wound had some surface eschar however after this was removed he has no open wound.he was already revascularized by Dr. Jacinto Halim when he came to our clinic with atherectomy of the left SFA and popliteal artery. He was also sent to interventional radiology for venous reflux studies. He was not felt to have significant reflux but certainly has chronic venous changes of his skin with hemosiderin deposition around this area. He will definitely need to lubricate his skin and wear compression stocking and I've talked to him about this. READMISSION 05/26/2018 This is a now 72 year old man we cared for with traumatic wounds on his left anterior lower extremity. He had been previously revascularized during that admission by Dr. Jacinto Halim. Apparently in follow-up Dr. Jacinto Halim noted that he had deterioration in his arterial status. He underwent a stent placement in the distal left SFA on 04/22/2018. 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 He went back to the angiography suite on 04/30/2018 he underwent PTA and balloon angioplasty of the occluded left mid anterior tibial artery, thrombotic occlusion went from 100 to 0% which reconstitutes the posterior tibial artery. He had  thrombectomy and aspiration of the peroneal artery. The stent placed in the distal SFA left SFA was still occluded. 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 Noninvasive studies on 05/02/2018 showed an TBI on the left at 0.43 and  0.82 on the right. He has been recuperating at [facility] in [facility] after the most recent hospitalization. He is going home tomorrow. He tells me that 2 weeks ago he traumatized the tip of his left fifth toe. He came in urgently for our review of this. This was a history of before I noted that Dr. Jacinto Halim had already noted dry gangrenous changes of the left fifth toe 06/09/2018; 2-week follow-up. I did contact Dr. Jacinto Halim after his last appointment and he apparently saw 1 of Dr. Verl Dicker colleagues the next day. He does not follow-up with Dr. Jacinto Halim himself until Thursday of this week. 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 Nevertheless there is no evidence of infection no drainage and no pain. He had a new area that this week when we were signing him in today on the left anterior mid tibia area, this is in close proximity to the previous wound we have dealt with in this clinic. 06/23/2018; 2-week follow-up. I did not receive a recent note from Dr. Jacinto Halim to review today. Our office is trying to obtain this. He is apparently not planning to do further vascular interventions and wondered about compression to try and help with the patient's chronic venous insufficiency. However we are also concerned about the arterial flow. He arrives in clinic today with a new area on the left third toe. The areas on the calf/anterior tibia are close to closing. The left fifth toe is still mummified using Betadine. -In reviewing things with the patient he has what sounds like claudication with mild to  moderate amount of activity. 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 I prescribed Levaquin over the phone while we attempted to arrange a plan of care. However the patient called yesterday to report he had low-grade fever and he came in today acutely. There is been a marked deterioration in the left third toe with spreading cellulitis up into the dorsal left foot. He was referred to the emergency room. Readmission: 06/29/2020 patient presents today for reevaluation here in our clinic he was previously treated by Dr. Leanord Hawking at the latter part of 2019 in 2 the beginning of 2020. Subsequently we have not seen him since that time in the interim he did have evaluation with vein and vascular specialist specifically Dr. Bo Mcclintock who did perform quite extensive work for a left femoral to anterior tibial artery bypass. With that being said in the interim the patient has developed significant lymphedema and has wounds that he tells me have really never healed in regard to the incision site on the left leg. He also has multiple wounds on the feet for various reasons some of which is that he tends to pick at his feet. Fortunately there is no signs of active infection systemically at this time he does have some wounds that are little bit deeper but most are fairly superficial he seems to have good blood flow and overall everything appears to be healthy I see no bone exposed and no obvious signs of osteomyelitis. I do not know that he necessarily needs a x-ray at this point although that something we could consider depending on how things progress. The patient does have a history of lymphedema, diabetes, this is type II, chronic kidney disease stage III, hypertension, and history of peripheral vascular disease. 07/05/2020; patient admitted last week. Is a patient I remember from 2019 he had a spreading infection involving the  left foot and we sent him to the hospital. 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 He subsequently had a left femoral to anterior tibial bypass by Dr.Cain vein and vascular. He also has severe lymphedema with chronic skin changes related to that on the left leg. The most problematic area that was new today was on the left medial great toe. This was apparently a small area last week there was purulent drainage which our intake nurse cultured. Also areas on the left medial foot and heel left lateral foot. He has 2 areas on the left medial calf left lateral calf in the setting of the severe lymphedema. 07/13/2020 on evaluation today patient appears to be doing better in my opinion compared to his last visit. The good news is there is no signs of active infection systemically and locally I do not see any signs of infection either. He did have an x-ray which was negative that is great news he had a culture which showed MRSA but at the same time he is been on the doxycycline which has helped. I do think we may want to extend this for 7 additional days 1/25; patient admitted to the clinic a few weeks ago. 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 We have been putting him under compression his edema control is a lot better but he is severe verricused skin on the left leg. He is really done quite well he still has an open area on the left medial calf and the left medial first metatarsal head. We have been using silver collagen on the leg silver alginate on the foot 07/27/2020 upon evaluation today patient appears to be doing decently well in regard to his wounds. He still has a lot of dry skin on the left leg. Some of this is starting to peel back and I think he may be able to have them out by removing some that today. Fortunately there is no signs of active infection at this time on the left leg although on the right leg he does appear to have swelling and erythema as well as  some mild warmth to touch. This does have been concerned about the possibility of cellulitis although within the differential diagnosis I do think that potentially a DVT has to be at least considered. We need to rule that out before proceeding would just call in the cellulitis. Especially since he is having pain in the posterior aspect of his calf muscle. 2/8; the patient had seen sparingly. He has severe skin changes of chronic lymphedema in the left leg thickened hyperkeratotic verrucous skin. He has an open wound on the medial part of the left first met head left mid tibia. He also has a rim of nonepithelialized skin in the anterior mid tibia. He brought in the AmLactin lotion that was been prescribed although I am not sure under compression and its utility. There concern about cellulitis on the right lower leg the last time he was here. He was put on on antibiotics. His DVT rule out was negative. The right leg looks fine he is using his stocking on this area 08/10/2020 upon evaluation today patient appears to be doing well with regard to his leg currently. He has been tolerating the dressing changes without complication. Fortunately there is no signs of active infection which is great news. Overall very pleased with where things stand. 2/22; the patient still has  an area on the medial part of the left first met his head. This looks better than when I last saw this earlier this month he has a rim of epithelialization but still some surface debris. Mostly everything on the left leg is healed. There is still a vulnerable in the left mid tibia area. 08/30/2020 upon evaluation today patient appears to be doing much better in regard to his wounds on his foot. 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 Nonetheless I think we will address that today I Minna send in a  prescription for him in that regard. Overall though there does not appear to be any signs of significant worsening. 09/07/2020 on evaluation today patient's wounds over his left foot appear to be doing excellent. I do not see any signs of infection there is some callus buildup this can require debridement for certain but overall I feel like he is managing quite nicely. He still using the AmLactin cream which has been beneficial for him as well. 3/22; left foot wound is closed. There is no open area here. He is using ammonium lactate lotion to the lower extremities to help exfoliate dry cracked skin. He has compression stockings from [employer] [facility] in [place]. The wound on the medial part of his left first met head is healed today. SAVIOR, BASNIGHT ([id number]) [id number] Page 12 of 20 READMISSION 04/12/2021 [name] is a patient we know fairly well he had a prolonged stay in clinic in 2019 with wounds on his left lateral and left anterior lower extremity in the setting of chronic venous insufficiency. More recently he was here earlier this year with predominantly an area on his left foot first metatarsal head plantar and he says the plantar foot broke down on its not long after we discharged him but he did not come back here. The last few months areas of broken down on his left anterior and again the left lateral lower extremity. The leg itself is very swollen chronically enlarged a lot of hyperkeratotic dry Berry Q skin in the left lower leg. His edema extends well into the thigh. He was seen by Dr. Randie Heinz. He had ABIs on 03/02/2021 showing an ABI on the right of 1 with a TBI of 0.72 his ABI in the left at 1.09 TBI of 0.99. Monophasic and biphasic waveforms on the right. On the left monophasic waveforms were noted he went on to have an angiogram on 03/27/2021 this showed the aortic aortic and iliac segments were free of flow-limiting stenosis the left common femoral vein to  evaluate the left femoral to anterior tibial artery bypass was unobstructed the bypass was patent without any areas of stenosis. We discharged the patient in bilateral juxta lite stockings but very clearly that was not sufficient to control the swelling and maintain skin integrity. He is clearly going to need compression pumps. 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 This is fortunate because he is on his feet for long periods of time. 10/27; patient comes in with our intake nurse reporting copious amount of green drainage from the left anterior mid tibia the left dorsal foot and to a lesser extent the left medial mid tibia. We left the compression wrap on all week for the amount of edema in his left  leg is quite a bit better. We use silver alginate as the primary dressing 11/3; edema control is good. Left anterior lower leg left medial lower leg and the plantar first metatarsal head. The left anterior lower leg required debridement. Deep tissue culture I did of this wound showed MRSA I put him on 10 days of doxycycline which she will start today. We have him in compression wraps. He has a security card and [employer] however he is retiring on November 15. We will need to then get him into a better offloading boot for the left foot perhaps a total contact cast 11/10; edema control is quite good. Left anterior and left medial lower leg wounds in the setting of chronic venous insufficiency and lymphedema. He also has a substantial area over the left plantar first metatarsal head. 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 He has significant hypergranulation on the left plantar foot wound. The patient is a diabetic but he does not have significant PAD 11/17; edema control is quite good. Left anterior and left medial lower leg wounds look better. The really  concerning area remains the area on the left plantar first metatarsal head. He has a rim of epithelialization. 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 Starting today with a forefoot off loader but . possibly a total contact cast. 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 Arrives with a very significant odor from I think all of the wounds on the left leg and the left foot. Because of this I did not put a total contact cast on him today but will could still consider this. His wife was having cataract surgery which is the reason he missed the appointment 12/6. I saw this man 5 days ago with a swelling below the popliteal fossa. I thought he actually might have a Baker's cyst however the DVT rule out study that we could arrange right away was negative the technician told me this was not a ruptured Baker's cyst. We attempted to get this aspirated by under ultrasound guidance in interventional radiology however all they did was an ultrasound however it shows an extensive fluid collection 62 x 8 x 9.4 in the left thigh and left calf. The patient states he thinks this started 8 days ago or so but he really is not complaining of any pain, fever or systemic symptoms. He has not ha 12/20; after some difficulty I managed to get the patient into see Dr. Randie Heinz. Eventually he was taken into the hospital and had a drain put in the fluid collection below his left knee posteriorly extending into the posterior thigh. He still has the drain in place. Culture of this showed moderate staff aureus few Morganella and few Klebsiella he is now on doxycycline and ciprofloxacin as suggested by infectious disease he is on this for a month. The drain will remain in place until it stops draining 12/29; he  comes in today with the 1 wound on his left leg and the area on the left plantar first met head significantly smaller. Both look healthy. He still has the drain in the left leg. He says he has to  change this daily. Follows up with Dr. Randie Heinz on January 11. 06/29/2021; the wounds that I am following on the left leg and left first met head continued to be quite healthy. However the area where his inferior drain is in place had copious amounts of drainage which was green in color. The wound here is larger. Follows up with Dr. Pascal Lux of vein and vascular his surgeon next week as well as infectious disease. He remains on ciprofloxacin and doxycycline. He is not complaining of excessive pain in either one of the drain areas 1/12; the patient saw vascular surgery and infectious disease. Vascular surgery has left the drain in place as there was still some notable drainage still see him back in 2 weeks. Dr. Dorthula Perfect stop the doxycycline and ciprofloxacin and I do not believe he follows up with them at this point. Culture I did last week showed both doxycycline resistant MRSA and Pseudomonas not sensitive to ciprofloxacin although only in rare titers 1/19; the patient's wound on the left anterior lower leg is just about healed. We have continued healing of the area that was medially on the left leg. Left first plantar metatarsal head continues to get smaller. The major problem here is his 2 drain sites 1 on the left upper calf and lateral thigh. There is purulent drainage still from the left lateral thigh. I gave him antibiotics last week but we still have recultured. He has the drain in the area I think this is eventually going to have to come out. I suspect there will be a connecting wound to heal here perhaps with improved VAc 1/26; the patient had his drain removed by vein and vascular on 1/25/. This was a large pocket of fluid in his left thigh that seem to tunnel into his left upper calf. 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 His mention his Penrose drain was removed today. He now has a tunneling wound on his left calf and left thigh. Both of these probe widely towards each other although I cannot really prove that they connect. Both wounds on his lower leg anteriorly are closed and his area over the first metatarsal head on his right foot continues to improve. We are using Hydrofera Blue here. He also saw infectious disease culture of the abscess they noted was polymicrobial with MRSA, Morganella and Klebsiella he was treated with doxycycline and ciprofloxacin for 4 weeks ending on 07/03/2021. They did not recommend any further antibiotics. Notable that while he still had the Penrose drain in place last week he had purulent drainage coming out of the inferior IandD site this grew [facility], MRSA and Pseudomonas but there does not appear to be any active infection in this area today with the drain out and he is not systemically unwell 2/2; with regards to the drain sites the superior one on the thigh actually is closed down the one on the upper left lateral calf measures about 8 and half centimeters which is an improvement seems to be less prominent although still with a lot of drainage. The only remaining wound is over the first metatarsal head on the left foot and this looks to be continuing to improve with Hydrofera Blue. 2/9; the area on his plantar left foot continues to contract. Callus around the wound edge. The drain sites specifically have not come down in depth. We put the wound VAC on Monday he changed the canister late last night our intake nurse reported a pocket of  fluid perhaps caused by our compression wraps 2/16; continued improvement in left foot plantar wound. drainage site in the calf is not improved in terms of depth (wound vac) 2/23; continued improvement in the left foot wound over the first metatarsal head. With regards to the drain sites the area on his thigh  laterally is healed however the open area on his calf is small in terms of circumference by still probes in by about 15 cm. Within using the wound VAC. Hydrofera Blue on his foot JONNATHON, JAGIELSKI ([id number]) [id number] Page 13 of 20 08/24/2021: The left first metatarsal head wound continues to improve. The wound bed is healthy with just some surrounding callus. Unfortunately the open drain site on his calf remains open and tunnels at least 15 cm (the extent of a Q-tip). This is despite several weeks of wound VAC treatment. Based on reading back through the notes, there has been really no significant change in the depth of the wound, although the orifice is smaller and the more cranial wound on his thigh has closed. I suspect the tunnel tracks nearly all the way to this location. 08/31/2021: Continued improvement in the left first metatarsal head wound. There has been absolutely no improvement to the long tunnel from his open drain site on his calf. We have tried to get him into see vascular surgery sooner to consider the possibility of simply filleting the tract open and allowing it to heal from the bottom up, likely with a wound VAC. They have not yet scheduled a sooner appointment than his current mid April 09/14/2021: He was seen by vascular surgery and they took him to the operating room last week. They opened a portion of the tunnel, but did not extend the entire length of the known open subcutaneous tract. I read Dr. Darcella Cheshire operative note and it is not clear from that documentation why only a portion of the tract was opened. The heaped up granulation tissue was curetted and removed from at least some portion of the tract. They did place a wound VAC and applied an Unna boot to the leg. The ulcer on his left first metatarsal head is smaller today. The bed looks good and there is just a small amount of surrounding callus. 09/21/2021: The ulcer on his left first metatarsal head  looks to be stalled. There is some callus surrounding the wound but the wound bed itself does not appear particularly dynamic. The tunnel tract on his lateral left leg seems to be roughly the same length or perhaps slightly smaller but the wound bed appears healthy with good granulation tissue. He opened up a new wound on his medial thigh and the site of a prior surgical incision. He says that he did this unconsciously in his sleep by scratching. 09/28/2021: Unfortunately, the ulcer on his left first metatarsal head has extended underneath the callus toward the dorsum of his foot. The medial thigh wounds are roughly the same. The tunnel on his lateral left leg continues to be problematic; it is longer than we are able to actually probe with a Q-tip. I am still not certain as to why Dr. Randie Heinz did not open this up entirely when he took the patient to the operating room. We will likely be back in the same situation with just a small superficial opening in a long unhealed tract, as the open portion is granulating in nicely. 10/02/2021: The patient was initially scheduled for a nurse visit, but we are also applying a total contact  cast today. The plantar foot wound looks clean without significant accumulated callus. We have been applying Prisma silver collagen to the site. 10/05/2021: The patient is here for his first total contact cast change. We have tried using gauze packing strips in the tunnel on his lateral leg wound, but this does not seem to be working any better than the white VAC foam. The foot ulcer looks about the same with minimal periwound callus. Medial thigh wound is clean with just some overlying eschar. 10/12/2021: The plantar foot wound is stable without any significant accumulation of periwound callus. The surface is viable with good granulation tissue. The medial thigh wounds are much smaller and are epithelializing. On the other hand, he had purulent drainage coming from the tunnel on his  lateral leg. He does go back to see Dr. Randie Heinz next week and is planning to ask him why the wound tunnel was not completely opened at the time of his most recent operation. 10/19/2021: The plantar foot wound is markedly improved and has epithelial tissue coming through the surface. The medial thigh wounds are nearly closed with just a tiny open area. He did see Dr. Randie Heinz earlier this week and apparently they did discuss the possibility of opening the sinus tract further and enabling a wound VAC application. Apparently there are some limits as to what Dr. 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 I think if we could get the tract open to the level of the popliteal fossa, this would greatly aid in her ability to get this chart closed. That being said, however, today when I probed the tract with a Q-tip, I was not able to insert the entirety of the Q-tip as I have on previous occasions. The tunnel is shorter by about 4 cm. The surface is clean with good granulation tissue and no further episodes of purulent drainage. 10/30/2021: Last week, the patient underwent surgery and had the long tract in his leg opened. There was a rind that was debrided, according to the operative report. His medial thigh ulcers are closed. The plantar foot wound is clean with a good surface and some built up surrounding callus. 11/06/2021: The overall dimensions of the large wound on his lateral leg remain about the same, but there is good granulation tissue present and the tunneling is a little bit shorter. He has a new wound on his anterior tibial surface, in the same location where he had a similar lesion in the past. The plantar foot wound is clean with some buildup surrounding callus. Just toward the medial aspect of his foot, however, there is an area of darkening that once debrided, revealed another opening in the skin surface. 11/13/2021: The anterior tibial surface wound is closed. The  plantar foot wound has some surrounding callus buildup. The area of darkening that I debrided last week and revealed an opening in the skin surface has closed again. The tunnel in the large wound on his lateral leg has come in by about 3 cm. There is healthy granulation tissue on the entire wound surface. 11/23/2021: The patient was out of town last week and did wet-to-dry dressings on his large wound. 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 Unfortunately, he picked open the wound on his left medial thigh. He says that it was itching and he just could not stop scratching it until it was open again. The wound on his plantar foot  is smaller and has not accumulated a tremendous amount of callus. The lateral leg wound is shallower and the tunnel has also decreased in depth. There is just a little bit of slough accumulation on the surface. 11/30/2021: Another portion of his left medial thigh has been opened up. All of these wounds are fairly superficial with just a little bit of slough and eschar accumulation. The wound on his plantar foot is almost closed with just a bit of eschar and periwound callus accumulation. The lateral leg wound is nearly flush with the surrounding skin and the tunnel is markedly shallower. 12/07/2021: There is just 1 open area on his left medial thigh. It is clean with just a little bit of perimeter eschar. The wound on his plantar foot continues to contract and just has some eschar and periwound callus accumulation. The lateral leg wound is closing at the more distal aspect and the tunnel is smaller. The surface is nearly flush with the surrounding skin and it has a good bed of granulation tissue. 12/14/2021: The thigh and foot wounds are closed. The lateral leg wound has closed over approximately half of its length. The tunnel continues to contract and the surface is now flush with the surrounding skin. The wound  bed has robust granulation tissue. 12/22/2021: The thigh and foot wounds have reopened. The foot wound has a lot of callus accumulation around and over it. The thigh wound is tiny with just a little bit of slough in the wound bed. The lateral leg wound continues to contract. His vascular surgeon took the wound VAC off earlier in the week and the patient has been doing wet-to-dry dressings. There is a little slough accumulation on the surface. The tunnel is about 3 cm in depth at this point. 12/28/2021: The thigh wound is closed again. The foot wound has some callus that subsequently has peeled back exposing just a small slit of a wound. The lateral leg wound Is down to about half the size that it originally was and the tunnel is down to about half a centimeter in depth. 01/04/2022: The thigh wound remains closed. The foot wound has heavy callus overlying the wound site. Once this was debrided, the wound was found to be closed. The lateral leg wound is smaller again this week and very superficial. No tunnel could be identified. 01/12/2022: The thigh and foot wounds both remain closed. The lateral leg wound is now nearly flush with the skin surface. There is good granulation tissue present with a light layer of slough. 01/19/2022: Due to the way his wrap was placed, the patient did not change the dressing on his thigh at all and so the foam was saturated and his skin is macerated. There is a light layer of slough on the wound surface. The underlying granulation tissue is robust and healthy-appearing. He has heavy callus buildup at the site of his first metatarsal head wound which is still healed. 02/01/2022: He has been in silver alginate. When he removed the dressing from his thigh wound, however, some leg, superficially reopening a portion of the wound that had healed. In addition, underneath the callus at his left first metatarsal head, there appears to be a blister and the wound appears to be open  again. 02/08/2022: The lateral leg wound has contracted substantially. There is eschar and a light layer of slough present. He says that it is starting to pull and is MARKHI, KINDRED ([id number]) [id number] Page 14 of 20 uncomfortable. On inspection, there is  some puckering of the scar and the eschar is quite dry; this may account for his symptoms. On his first metatarsal head, the wound is much smaller with just some eschar on the surface. The callus has not reaccumulated. He reports that he had a blister come up on his medial thigh wound at the distal aspect. It popped and there is now an opening in his skin again. Looking back through his library of wound photos, there is what looks like a permanent suture just deep to this location and it may be trying to erode through. We have been using silver alginate on his wounds. 02/15/2022: The lateral leg wound is about half the size it was last week. It is clean with just a little perimeter eschar and light slough. The wound on his first metatarsal head is about the same with heavy callus overlying it. The medial thigh wound is closed again. He does have some skin changes on the top of his foot that looks potentially yeast related. 02/22/2022: The skin on the top of his foot improved with the use of a topical antifungal. The lateral leg wound continues to contract and is again smaller this week. There is a little bit of slough and eschar on the surface. The first metatarsal head wound is a little bit smaller but has reaccumulated a thick callus over the top. He decided to try to trim his toenail and ultimately took the entire nail off of his left great toe. 03/02/2022: His lateral leg wound continues to improve, as does the wound on his left great toe. Unfortunately, it appears that somehow his foot got wet and moisture seeped in through the opening causing his skin to lift. There is a large wound now overlying his first metatarsal on  both the plantar, medial, and dorsal portion of his foot. There is necrotic tissue and slough present underneath the shaggy macerated skin. 03/08/2022: The lateral leg wound is smaller again today. There is just a light layer of slough and eschar on the surface. The great toe wound is smaller again today. The first metatarsal wound is a little bit smaller today and does not look nearly as necrotic and macerated. There is still slough and nonviable tissue present. 03/15/2022: The lateral leg wound is narrower and just has a little bit of light slough buildup. The first metatarsal wound still has a fair amount of moisture affecting the periwound skin. The great toe wound is healed. 03/22/2022: The lateral leg wound is now isolated to just at the level of his knee. There is some eschar and slough accumulation. The first metatarsal head wound has epithelialized tremendously and is about half the size that it was last week. He still has some maceration on the top of his foot and a fungal odor is present. 03/29/2022: T oday the patient's foot was macerated, suggesting that the cast got wet. The patient has also been picking at his dry skin and has enlarged the wound on his left lateral leg. In the time between having his cast removed and my evaluation, he had picked more dry skin and opened up additional wounds on his Achilles area and dorsal foot. The plantar first metatarsal head wound, however, is smaller and clean with just macerated callus around the perimeter and light slough on the surface. The lateral leg wound measured a little bit larger but is also fairly clean with eschar and minimal slough. 04/02/2022: The patient had vascular studies done last Friday and so his cast was  not applied. He is here today to have that done. 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 04/05/2022: Both wounds are smaller and quite clean. There is just a little biofilm on the lateral leg wound. 10/20; the  patient has a wound on the left lateral surgical incision at the level of his lateral knee this looks clean and improved. He is using silver alginate. He also has an area on his left medial foot for which she is using Hydrofera Blue under a total contact cast both wounds are measuring smaller 04/20/2022: The plantar foot wound has contracted considerably and is very close to closing. The lateral leg wound was measured a little larger, but there was a tiny open area that was included in the measurements that was not included last week. He has some eschar around the perimeter but otherwise the wound looks clean. 04/27/2022: The lateral leg wound looks better this week. He says that midweek, he felt it was very dry and began applying hydrogel to the site. I think this was beneficial. The foot wound is nearly closed underneath a thick layer of dry skin and callus. 05/04/2022: The foot wound is healed. He has developed a new small ulcer on his anterior tibial surface about midway up his leg. It has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  01/03/2023 Medical Record Number: [id number] Patient Account Number: [id number] Date of Birth/Sex: Treating RN: 12-28-1950 (72 y.o. 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.51 Type 2 diabetes mellitus with diabetic peripheral angiopathy without gangrene I89.0 Lymphedema, not elsewhere classified L85.9 Epidermal thickening, unspecified Facility Procedures : CPT4 Code: [id number] Description: 97597 - DEBRIDE WOUND 1ST 20 SQ CM OR < ICD-10 Diagnosis Description L97.528 Non-pressure chronic ulcer of other part of left foot with other specified severi Modifier: ty Quantity: 1 Physician Procedures : CPT4 Code Description Modifier [id number] - WC PHYS LEVEL 4 - EST PT 25 ICD-10 Diagnosis Description L97.528 Non-pressure chronic ulcer of other part of left foot with other specified severity I87.322 Chronic venous hypertension (idiopathic) with  inflammation of left lower extremity E11.51 Type 2 diabetes mellitus with diabetic peripheral angiopathy without gangrene I89.0 Lymphedema, not elsewhere classified Quantity: 1 : [id number] 97597 - WC PHYS DEBR WO ANESTH 20 SQ CM ICD-10 Diagnosis Description L97.528 Non-pressure chronic ulcer of other part of left foot with other specified severity Quantity: 1 Electronic Signature(s) Signed: 01/03/2023 11:08:27 AM By: [name] MD  FACS Entered By: [name] on 01/03/2023 11:08:26

## 2023-02-21 ENCOUNTER — Other Ambulatory Visit: Payer: Self-pay | Admitting: Nurse Practitioner

## 2023-02-21 ENCOUNTER — Encounter (HOSPITAL_BASED_OUTPATIENT_CLINIC_OR_DEPARTMENT_OTHER): Payer: Medicare Other | Admitting: General Surgery

## 2023-02-21 DIAGNOSIS — E11621 Type 2 diabetes mellitus with foot ulcer: Secondary | ICD-10-CM | POA: Diagnosis not present

## 2023-02-21 NOTE — Progress Notes (Signed)
RALEN, KAMPE ([id number]) [id number] Page 1 of 9 Visit Report for 02/21/2023 Arrival Information Details Patient Name: Date of Service: [name], [name] 02/21/2023 10:00 A M Medical Record Number: [id number] Patient Account Number: [id number] Date of Birth/Sex: Treating RN: [birth year] (72 y.o. M) Primary Care Hydeia Mcatee: [name] Other Clinician: Referring Bralyn Folkert: Treating Aisley Whan/Extender: [name] in Treatment: 69 Visit Information History Since Last Visit All ordered tests and consults were completed: No Patient Arrived: Ambulatory Added or deleted any medications: No Arrival Time: 10:04 Any new allergies or adverse reactions: No Accompanied By: self Had a fall or experienced change in No Transfer Assistance: None activities of daily living that may affect Patient Identification Verified: Yes risk of falls: Secondary Verification Process Completed: Yes Signs or symptoms of abuse/neglect since last visito No Patient Requires Transmission-Based Precautions: No Hospitalized since last visit: No Patient Has Alerts: Yes Implantable device outside of the clinic excluding No cellular tissue based products placed in the center since last visit: Pain Present Now: No Electronic Signature(s) Signed: 02/21/2023 2:38:26 PM By: Dayton Scrape Entered By: Dayton Scrape on 02/21/2023 10:04:28 -------------------------------------------------------------------------------- Compression Therapy Details Patient Name: Date of Service: [name] 02/21/2023 10:00 A M Medical Record Number: [id number] Patient Account Number: [id number] Date of Birth/Sex: Treating RN: 1951-01-12 (72 y.o. [name] Primary Care Tieler Cournoyer: [name] Other Clinician: Referring Leslie Langille: Treating Adrianna Dudas/Extender: [name] in Treatment: 13 Compression Therapy Performed for Wound Assessment: Wound #18R Left,Plantar  Metatarsal head first Performed By: Clinician [name], RN Compression Type: Double Layer Post Procedure Diagnosis Same as Pre-procedure Electronic Signature(s) Signed: 02/21/2023 4:25:40 PM By: Gelene Mink By: [name] on 02/21/2023 10:20:20 [name] ([id number]) [id number] Page 2 of 9 -------------------------------------------------------------------------------- Encounter Discharge Information Details Patient Name: Date of Service: [name], [name] 02/21/2023 10:00 A M Medical Record Number: [id number] Patient Account Number: [id number] Date of Birth/Sex: Treating RN: 1950/09/01 (72 y.o. [name] Primary Care Suhas Estis: [name] Other Clinician: Referring Dickie Cloe: Treating Aicha Clingenpeel/Extender: [name] in Treatment: 97 Encounter Discharge Information Items Post Procedure Vitals Discharge Condition: Stable Temperature (F): 98.6 Ambulatory Status: Ambulatory Pulse (bpm): 73 Discharge Destination: Home Respiratory Rate (breaths/min): 18 Transportation: Private Auto Blood Pressure (mmHg): 145/73 Accompanied By: self Schedule Follow-up Appointment: Yes Clinical Summary of Care: Patient Declined Electronic Signature(s) Signed: 02/21/2023 4:25:40 PM By: [name] Entered By: [name] on 02/21/2023 10:43:39 -------------------------------------------------------------------------------- Lower Extremity Assessment Details Patient Name: Date of Service: [name] 02/21/2023 10:00 A M Medical Record Number: [id number] Patient Account Number: [id number] Date of Birth/Sex: Treating RN: 05/07/1951 (72 y.o. [name] Primary Care Mersedes Alber: [name] Other Clinician: Referring Lindey Renzulli: Treating Maynard David/Extender: [name] in Treatment: 97 Edema Assessment Assessed: [Left: No] [Right: No] Edema: [Left: Ye]  [Right: s] Calf Left: Right: Point of Measurement: 41 cm From Medial Instep 44.7 cm Ankle Left: Right: Point of Measurement: 10 cm From Medial Instep 26 cm Vascular Assessment Pulses: Dorsalis Pedis Palpable: [Left:No] Extremity colors, hair growth, and conditions: Extremity Color: [Left:Hyperpigmented] Hair Growth on Extremity: [Left:No] Temperature of Extremity: [Left:Warm] Capillary Refill: [Left:< 3 seconds] Dependent Rubor: [Left:No No] Electronic Signature(s) Signed: 02/21/2023 4:25:40 PM By: [name] Entered By: [name] on 02/21/2023 10:10:44 [name] ([id number]) [id number] Page 3 of 9 -------------------------------------------------------------------------------- Multi Wound Chart Details Patient Name: Date of Service: [name], [name] 02/21/2023 10:00 A M Medical Record Number: [id number] Patient Account Number: [id number]  Date of Birth/Sex: Treating RN: [birth year] (71 y.o. M) Primary Care Kailena Lubas: [name] Other Clinician: Referring Maggy Wyble: Treating Paullette Mckain/Extender: [name] in Treatment: 97 Vital Signs Height(in): 74 Capillary Blood Glucose(mg/dl): 161 Weight(lbs): 096 Pulse(bpm): 73 Body Mass Index(BMI): 30.6 Blood Pressure(mmHg): 145/73 Temperature(F): 98.6 Respiratory Rate(breaths/min): 18 [18R:Photos:] [N/A:N/A] Left, Plantar Metatarsal head first Left, Proximal, Lateral Lower Leg N/A Wound Location: Gradually Appeared Bump N/A Wounding Event: Diabetic Wound/Ulcer of the Lower Cyst N/A Primary Etiology: Extremity Glaucoma, Sleep Apnea, Glaucoma, Sleep Apnea, N/A Comorbid History: Hypertension, Peripheral Arterial Hypertension, Peripheral Arterial Disease, Peripheral Venous Disease,Disease, Peripheral Venous Disease, Type II Diabetes, Gout, Osteoarthritis, Type II Diabetes, Gout, Osteoarthritis, Neuropathy Neuropathy 08/23/2020 06/03/2021 N/A Date Acquired: 63  89 N/A Weeks of Treatment: Open Open N/A Wound Status: Yes Yes N/A Wound Recurrence: No Yes N/A Clustered Wound: N/A 3 N/A Clustered Quantity: 1.7x2x0.1 0.1x0.1x0.1 N/A Measurements L x W x D (cm) 2.67 0.008 N/A A (cm) : rea 0.267 0.001 N/A Volume (cm) : 81.10% 99.50% N/A % Reduction in A rea: 90.60% 99.90% N/A % Reduction in Volume: Grade 2 Full Thickness With Exposed Support N/A Classification: Structures Medium Small N/A Exudate A mount: Serosanguineous Serous N/A Exudate Type: red, brown amber N/A Exudate Color: Thickened Fibrotic scar, thickened scar N/A Wound Margin: Large (67-100%) Large (67-100%) N/A Granulation A mount: Red Red N/A Granulation Quality: Small (1-33%) None Present (0%) N/A Necrotic A mount: Fat Layer (Subcutaneous Tissue): Yes Fat Layer (Subcutaneous Tissue): Yes N/A Exposed Structures: Fascia: No Fascia: No Tendon: No Tendon: No Muscle: No Muscle: No Joint: No Joint: No Bone: No Bone: No Small (1-33%) Large (67-100%) N/A Epithelialization: Debridement - Excisional Debridement - Selective/Open Wound N/A Debridement: Pre-procedure Verification/Time Out 10:20 10:20 N/A Taken: N/A Lidocaine 5% topical ointment N/A Pain Control: Subcutaneous, Slough Necrotic/Eschar N/A Tissue Debrided: Skin/Subcutaneous Tissue Non-Viable Tissue N/A Level: 2.67 0.01 N/A Debridement A (sq cm): rea Curette Curette N/A Instrument: Minimum Minimum N/A Bleeding: Pressure Pressure N/A Hemostasis A chieved: Procedure was tolerated well Procedure was tolerated well N/A Debridement Treatment Response: [name] ([id number]) [id number] Page 4 of 9 1.7x2x0.1 0.1x0.1x0.1 N/A Post Debridement Measurements L x W x D (cm) 0.267 0.001 N/A Post Debridement Volume: (cm) Callus: Yes Scarring: Yes N/A Periwound Skin Texture: Maceration: Yes Dry/Scaly: Yes N/A Periwound Skin Moisture: Dry/Scaly: No No Abnormalities  Noted Hemosiderin Staining: Yes N/A Periwound Skin Color: No Abnormality No Abnormality N/A Temperature: Compression Therapy Debridement N/A Procedures Performed: Debridement Treatment Notes Electronic Signature(s) Signed: 02/21/2023 10:30:54 AM By: [name] MD FACS Entered By: [name] on 02/21/2023 10:30:54 -------------------------------------------------------------------------------- Multi-Disciplinary Care Plan Details Patient Name: Date of Service: [name] 02/21/2023 10:00 A M Medical Record Number: [id number] Patient Account Number: [id number] Date of Birth/Sex: Treating RN: 1950/09/16 (72 y.o. [name] Primary Care Jhamir Pickup: [name] Other Clinician: Referring Lynnett Langlinais: Treating Zulema Pulaski/Extender: [name] in Treatment: 19 Multidisciplinary Care Plan reviewed with physician Active Inactive Venous Leg Ulcer Nursing Diagnoses: Knowledge deficit related to disease process and management Potential for venous Insuffiency (use before diagnosis confirmed) Goals: Patient will maintain optimal edema control Date Initiated: 07/27/2021 Target Resolution Date: 03/22/2023 Goal Status: Active Interventions: Assess peripheral edema status every visit. Treatment Activities: Therapeutic compression applied : 07/27/2021 Notes: Wound/Skin Impairment Nursing Diagnoses: Impaired tissue integrity Knowledge deficit related to ulceration/compromised skin integrity Goals: Patient will have a decrease in wound volume by X% from date: (specify in notes) Date Initiated: 04/12/2021 Date Inactivated: 01/04/2022 Target Resolution Date: 04/23/2021  Goal Status: Met Patient/caregiver will verbalize understanding of skin care regimen Date Initiated: 01/04/2022 Target Resolution Date: 03/22/2023 Goal Status: Active Ulcer/skin breakdown will have a volume reduction of 30% by week 4 Date Initiated: 04/12/2021 Date Inactivated:  04/27/2021 Target Resolution Date: 04/27/2021 Goal Status: Unmet Unmet Reason: infection Ulcer/skin breakdown will have a volume reduction of 50% by week 8 Date Initiated: 04/27/2021 Date Inactivated: 06/29/2021 Target Resolution Date: 06/24/2021 [name], [name] ([id number]) [phone].pdf Page 5 of 9 Goal Status: Met Interventions: Assess patient/caregiver ability to obtain necessary supplies Assess patient/caregiver ability to perform ulcer/skin care regimen upon admission and as needed Assess ulceration(s) every visit Notes: Electronic Signature(s) Signed: 02/21/2023 4:25:40 PM By: [name] Entered By: [name] on 02/21/2023 10:04:59 -------------------------------------------------------------------------------- Pain Assessment Details Patient Name: Date of Service: [name] 02/21/2023 10:00 A M Medical Record Number: [id number] Patient Account Number: [id number] Date of Birth/Sex: Treating RN: 1950-06-27 (72 y.o. M) Primary Care Earle Troiano: [name] Other Clinician: Referring Haralambos Yeatts: Treating Gian Ybarra/Extender: [name] in Treatment: 97 Active Problems Location of Pain Severity and Description of Pain Patient Has Paino No Site Locations Pain Management and Medication Current Pain Management: Electronic Signature(s) Signed: 02/21/2023 2:38:26 PM By: Dayton Scrape Entered By: Dayton Scrape on 02/21/2023 10:04:56 -------------------------------------------------------------------------------- Patient/Caregiver Education Details Patient Name: Date of Service: [place], [name] 8/29/2024andnbsp10:00 A M Medical Record Number: [id number] Patient Account Number: [id number] Date of Birth/Gender: Treating RN: [birth year] (72 y.o. Lenise Herald Launa Flight, Dola Factor ([id number]) [id number] Page 6 of 9 Primary Care Physician: [name] Other Clinician: Referring Physician: Treating  Physician/Extender: [name] in Treatment: 53 Education Assessment Education Provided To: Patient Education Topics Provided Wound/Skin Impairment: Methods: Explain/Verbal Responses: Reinforcements needed, State content correctly Electronic Signature(s) Signed: 02/21/2023 4:25:40 PM By: [name] Entered By: [name] on 02/21/2023 10:05:15 -------------------------------------------------------------------------------- Wound Assessment Details Patient Name: Date of Service: [name] 02/21/2023 10:00 A M Medical Record Number: [id number] Patient Account Number: [id number] Date of Birth/Sex: Treating RN: Sep 18, 1950 (72 y.o. [name] Primary Care Tirzah Fross: [name] Other Clinician: Referring Carel Carrier: Treating Ivy Puryear/Extender: [name] in Treatment: 97 Wound Status Wound Number: 18R Primary Diabetic Wound/Ulcer of the Lower Extremity Etiology: Wound Location: Left, Plantar Metatarsal head first Wound Open Wounding Event: Gradually Appeared Status: Date Acquired: 08/23/2020 Comorbid Glaucoma, Sleep Apnea, Hypertension, Peripheral Arterial Disease, Weeks Of Treatment: 97 History: Peripheral Venous Disease, Type II Diabetes, Gout, Osteoarthritis, Clustered Wound: No Neuropathy Photos Wound Measurements Length: (cm) 1.7 Width: (cm) 2 Depth: (cm) 0.1 Area: (cm) 2.67 Volume: (cm) 0.267 % Reduction in Area: 81.1% % Reduction in Volume: 90.6% Epithelialization: Small (1-33%) Tunneling: No Undermining: No Wound Description Classification: Grade 2 Wound Margin: Thickened Exudate Amount: Medium Exudate Type: Serosanguineous Exudate Color: red, brown [name], [name] ([id number]) Wound Bed Granulation Amount: Large (67-100%) Granulation Quality: Red Necrotic Amount: Small (1-33%) Necrotic Quality: Adherent Slough Foul Odor After Cleansing: No Slough/Fibrino  Yes [phone].pdf Page 7 of 9 Exposed Structure Fascia Exposed: No Fat Layer (Subcutaneous Tissue) Exposed: Yes Tendon Exposed: No Muscle Exposed: No Joint Exposed: No Bone Exposed: No Periwound Skin Texture Texture Color No Abnormalities Noted: No No Abnormalities Noted: Yes Callus: Yes Temperature / Pain Temperature: No Abnormality Moisture No Abnormalities Noted: No Dry / Scaly: No Maceration: Yes Treatment Notes Wound #18R (Metatarsal head first) Wound Laterality: Plantar, Left Cleanser Soap and Water Discharge Instruction: May shower and wash wound with dial antibacterial soap and water prior to dressing  change. Wound Cleanser Discharge Instruction: Cleanse the wound with wound cleanser prior to applying a clean dressing using gauze sponges, not tissue or cotton balls. Peri-Wound Care Ketoconazole Cream 2% Discharge Instruction: Apply Ketoconazole as directed Triamcinolone 15 (g) Discharge Instruction: Use triamcinolone 15 (g) as directed Topical Gentamicin Discharge Instruction: As directed by physician Mupirocin Ointment Discharge Instruction: Apply Mupirocin (Bactroban) as instructed Primary Dressing Maxorb Extra Ag+ Alginate Dressing, 2x2 (in/in) Discharge Instruction: Apply to wound bed as instructed Secondary Dressing Optifoam Non-Adhesive Dressing, 4x4 in Discharge Instruction: Apply over primary dressing as directed. Woven Gauze Sponges 2x2 in Discharge Instruction: Apply over primary dressing as directed. Zetuvit Absorbent Pad, 4x4 (in/in) Secured With Compression Wrap Urgo K2, (equivalent to a 4 layer) two layer compression system, regular Discharge Instruction: Apply Urgo K2 as directed (alternative to 4 layer compression). Compression Stockings Add-Ons Electronic Signature(s) Signed: 02/21/2023 4:25:40 PM By: [name] Entered By: [name] on 02/21/2023 10:13:33 [name] ([id number])  [id number] Page 8 of 9 -------------------------------------------------------------------------------- Wound Assessment Details Patient Name: Date of Service: [name], [name] 02/21/2023 10:00 A M Medical Record Number: [id number] Patient Account Number: [id number] Date of Birth/Sex: Treating RN: 1951/01/04 (72 y.o. [name] Primary Care Melisha Eggleton: [name] Other Clinician: Referring Helen Winterhalter: Treating Emil Weigold/Extender: [name] in Treatment: 97 Wound Status Wound Number: 22R Primary Cyst Etiology: Wound Location: Left, Proximal, Lateral Lower Leg Wound Open Wounding Event: Bump Status: Date Acquired: 06/03/2021 Comorbid Glaucoma, Sleep Apnea, Hypertension, Peripheral Arterial Disease, Weeks Of Treatment: 89 History: Peripheral Venous Disease, Type II Diabetes, Gout, Osteoarthritis, Clustered Wound: Yes Neuropathy Photos Wound Measurements Length: (cm) Width: (cm) Depth: (cm) Clustered Quantity: Area: (cm) Volume: (cm) 0.1 % Reduction in Area: 99.5% 0.1 % Reduction in Volume: 99.9% 0.1 Epithelialization: Large (67-100%) 3 Tunneling: No 0.008 Undermining: No 0.001 Wound Description Classification: Full Thickness With Exposed Support Structures Wound Margin: Fibrotic scar, thickened scar Exudate Amount: Small Exudate Type: Serous Exudate Color: amber Foul Odor After Cleansing: No Slough/Fibrino No Wound Bed Granulation Amount: Large (67-100%) Exposed Structure Granulation Quality: Red Fascia Exposed: No Necrotic Amount: None Present (0%) Fat Layer (Subcutaneous Tissue) Exposed: Yes Tendon Exposed: No Muscle Exposed: No Joint Exposed: No Bone Exposed: No Periwound Skin Texture Texture Color No Abnormalities Noted: No No Abnormalities Noted: No Scarring: Yes Hemosiderin Staining: Yes Moisture Temperature / Pain No Abnormalities Noted: No Temperature: No Abnormality Dry / Scaly:  Yes Treatment Notes Wound #22R (Lower Leg) Wound Laterality: Left, Lateral, Proximal Cleanser Soap and Water Discharge Instruction: May shower and wash wound with dial antibacterial soap and water prior to dressing change. Wound Cleanser Discharge Instruction: Cleanse the wound with wound cleanser prior to applying a clean dressing using gauze sponges, not tissue or cotton balls. LEIF, BALLEK ([id number]) [id number] Page 9 of 9 Peri-Wound Care Topical Primary Dressing Endoform 2x2 in Discharge Instruction: Moisten with saline Secondary Dressing Woven Gauze Sponge, Non-Sterile 4x4 in Discharge Instruction: Apply over primary dressing as directed. Secured With Elastic Bandage 4 inch (ACE bandage) Discharge Instruction: Secure with ACE bandage as directed. Kerlix Roll Sterile, 4.5x3.1 (in/yd) Discharge Instruction: Secure with Kerlix as directed. Compression Wrap Compression Stockings Add-Ons Electronic Signature(s) Signed: 02/21/2023 4:25:40 PM By: [name] Entered By: [name] on 02/21/2023 10:14:02 -------------------------------------------------------------------------------- Vitals Details Patient Name: Date of Service: [name] 02/21/2023 10:00 A M Medical Record Number: [id number] Patient Account Number: [id number] Date of Birth/Sex: Treating RN: [birth year] (72 y.o. M) Primary Care Jahzeel Poythress: [name] Other Clinician: Referring Basil Blakesley:  Treating Christyn Gutkowski/Extender: [name] in Treatment: 97 Vital Signs Time Taken: 10:04 Temperature (F): 98.6 Height (in): 74 Pulse (bpm): 73 Weight (lbs): 238 Respiratory Rate (breaths/min): 18 Body Mass Index (BMI): 30.6 Blood Pressure (mmHg): 145/73 Capillary Blood Glucose (mg/dl): 086 Reference Range: 80 - 120 mg / dl Electronic Signature(s) Signed: 02/21/2023 2:38:26 PM By: Dayton Scrape Entered By: Dayton Scrape on 02/21/2023 10:04:49

## 2023-02-21 NOTE — Progress Notes (Addendum)
[name] ([id number]) [id number] Page 1 of 22 Visit Report for 02/21/2023 Chief Complaint Document Details Patient Name: Date of Service: [name], [name] 02/21/2023 10:00 A M Medical Record Number: [id number] Patient Account Number: [id number] Date of Birth/Sex: Treating RN: [birth year] (72 y.o. M) Primary Care Provider: Ralene [name] Other Clinician: Referring Provider: Treating Provider/Extender: [name] in Treatment: 97 Information Obtained from: Patient Chief Complaint Left leg and foot ulcers 04/12/2021; patient is here for wounds on his left lower leg and left plantar foot over the first metatarsal head Electronic Signature(s) Signed: 02/21/2023 10:31:07 AM By: [name] Guess MD FACS Entered By: [name] on 02/21/2023 10:31:07 -------------------------------------------------------------------------------- Debridement Details Patient Name: Date of Service: [name]. 02/21/2023 10:00 A M Medical Record Number: [id number] Patient Account Number: [id number] Date of Birth/Sex: Treating RN: [name] (72 y.o. [name] Primary Care Provider: Ralene [name] Other Clinician: Referring Provider: Treating Provider/Extender: [name] in Treatment: 97 Debridement Performed for Assessment: Wound #22R Left,Proximal,Lateral Lower Leg Performed By: Physician [name] Guess, MD Debridement Type: Debridement Level of Consciousness (Pre-procedure): Awake and Alert Pre-procedure Verification/Time Out Yes - 10:20 Taken: Start Time: 10:20 Pain Control: Lidocaine 5% topical ointment Percent of Wound Bed Debrided: 100% T Area Debrided (cm): otal 0.01 Tissue and other material debrided: Non-Viable, Eschar Level: Non-Viable Tissue Debridement Description: Selective/Open Wound Instrument: Curette Bleeding: Minimum Hemostasis Achieved: Pressure Response to Treatment: Procedure was  tolerated well Level of Consciousness (Post- Awake and Alert procedure): Post Debridement Measurements of Total Wound Length: (cm) 0.1 Width: (cm) 0.1 Depth: (cm) 0.1 Volume: (cm) 0.001 Character of Wound/Ulcer Post Debridement: Improved Post Procedure Diagnosis Same as [name] ([id number]) [id number] Page 2 of 22 Notes scribed for Dr. Lady [name] by [name] Bruin, RN Electronic Signature(s) Signed: 02/21/2023 10:48:59 AM By: [name] Guess MD FACS Signed: 02/21/2023 4:25:40 PM By: [name] By: [name] on 02/21/2023 10:24:15 -------------------------------------------------------------------------------- Debridement Details Patient Name: Date of Service: [name]. 02/21/2023 10:00 A M Medical Record Number: [id number] Patient Account Number: [id number] Date of Birth/Sex: Treating RN: [name] (72 y.o. [name] Primary Care Provider: Ralene [name] Other Clinician: Referring Provider: Treating Provider/Extender: [name] in Treatment: 97 Debridement Performed for Assessment: Wound #18R Left,Plantar Metatarsal head first Performed By: Physician [name] Guess, MD Debridement Type: Debridement Severity of Tissue Pre Debridement: Fat layer exposed Level of Consciousness (Pre-procedure): Awake and Alert Pre-procedure Verification/Time Out Yes - 10:20 Taken: Start Time: 10:20 Pain Control: Lidocaine 5% topical ointment Percent of Wound Bed Debrided: 100% T Area Debrided (cm): otal 2.67 Tissue and other material debrided: Non-Viable, Slough, Subcutaneous, Skin: Epidermis, Slough Level: Skin/Subcutaneous Tissue Debridement Description: Excisional Instrument: Curette Bleeding: Minimum Hemostasis Achieved: Pressure Response to Treatment: Procedure was tolerated well Level of Consciousness (Post- Awake and Alert procedure): Post Debridement Measurements  of Total Wound Length: (cm) 1.7 Width: (cm) 2 Depth: (cm) 0.1 Volume: (cm) 0.267 Character of Wound/Ulcer Post Debridement: Improved Severity of Tissue Post Debridement: Fat layer exposed Post Procedure Diagnosis Same as Pre-procedure Notes scribed for Dr. Lady [name] by [name] Bruin, RN Electronic Signature(s) Signed: 02/21/2023 10:48:59 AM By: [name] Guess MD FACS Signed: 02/21/2023 4:25:40 PM By: [name] Entered By: [name] on 02/21/2023 10:42:13 HPI Details -------------------------------------------------------------------------------- [name] ([id number]) [id number] Page 3 of 22 Patient Name: Date of Service: [name], [name] 02/21/2023 10:00 A M Medical Record Number: [id number] Patient Account Number: [id number] Date of Birth/Sex: Treating RN: [name][birth year] (71  y.o. M) Primary Care Provider: Ralene [name] Other Clinician: Referring Provider: Treating Provider/Extender: [name] in Treatment: 11 History of Present Illness HPI Description: 10/11/17; Mr. [name] is a 72 year old man who tells me that in 2015 he slipped down the latter traumatizing his left leg. He developed a wound in the same spot the area that we are currently looking at. He states this closed over for the most part although he always felt it was somewhat unstable. In 2016 he hit the same area with the door of his car had this reopened. He tells me that this is never really closed although sometimes an inflow it remains open on a constant basis. He has not been using any specific dressing to this except for topical antibiotics the nature of which were not really sure. His primary doctor did send him to see [name] of interventional cardiology. He underwent an angiogram on 08/06/17 and he underwent a PTA and directional atherectomy of the lesser distal SFA and popliteal arteries which resulted in brisk improvement in blood flow. It  was noted that he had 2 vessel runoff through the anterior tibial and peroneal. He is also been to see vascular and interventional radiologist. He was not felt to have any significant superficial venous insufficiency. Presumably is not a candidate for any ablation. It was suggested he come here for wound care. The patient is a type II diabetic on insulin. He also has a history of venous insufficiency. ABIs on the left were noncompressible in our clinic 10/21/17; patient we admitted to the clinic last week. 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 We put Iodosorb on him after an aggressive debridement and 3 layer compression. He complained of pain in his ankle and itching with is skin in fact he scratched the area on the medial calf superiorly at the rim of our wraps and he has 2 small open areas in that location today which are new. I changed his primary dressing today to silver collagen. As noted he is already had revascularization and does not have any significant superficial venous insufficiency that would be amenable to ablation 10/28/17; patient admitted to the clinic 2 weeks ago. He has a smaller Wound. Scratch injury from last week revealed. There is large wound over the tibial area. This is smaller. Granulation looks healthy. No need for debridement. 11/04/17; the wound on the left lateral calf looks better. Improved dimensions. Surface of this looks better. We've been maintaining him and Kerlix Coban wraps. He finds this much more comfortable. Silver collagen dressing 11/11/17; left lateral Wound continues to look healthy be making progress. Using a #5 curet I removed removed nonviable skin from the surface of the wound and then necrotic debris from the wound surface. Surface of the wound continues to look healthy. He also has an open area on the left great toenail bed. We've been using topical antibiotics. 11/19/17; left  anterior lateral wound continues to look healthy but it's not closed. He also had a small wound above this on the left leg Initially traumatic wounds in the setting of significant chronic venous insufficiency and stasis dermatitis 11/25/17; left anterior wounds superiorly is closed still a small wound inferiorly. 12/02/17; left anterior tibial area. Arrives today with adherent callus. Post debridement clearly not completely closed. Hydrofera Blue under 3 layer compression. 12/09/17; left anterior tibia. Circumferential eschar however the wound bed looks stable to improved. We've been using  Hydrofera Blue under 3 layer compression 12/17/17; left anterior tibia. Apparently this was felt to be closed however when the wrap was taken off there is a skin tear to reopen wounds in the same area we've been using Hydrofera Blue under 3 layer compression 12/23/17 left anterior tibia. Not close to close this week apparently the [facility] was stuck to this again. Still circumferential eschar requiring debridement. I put a contact layer on this this time under the Hydrofera Blue 12/31/17; left anterior tibia. Wound is better slight amount of hyper-granulation. Using Hydrofera Blue over Adaptic. 01/07/18; left anterior tibia. The wound had some surface eschar however after this was removed he has no open wound.he was already revascularized by [name] when he came to our clinic with atherectomy of the left SFA and popliteal artery. He was also sent to interventional radiology for venous reflux studies. He was not felt to have significant reflux but certainly has chronic venous changes of his skin with hemosiderin deposition around this area. He will definitely need to lubricate his skin and wear compression stocking and I've talked to him about this. READMISSION 05/26/2018 This is a now 73 year old man we cared for with traumatic wounds on his left anterior lower extremity. He had been previously revascularized during  that admission by [name]. Apparently in follow-up [name] noted that he had deterioration in his arterial status. He underwent a stent placement in the distal left SFA on 04/22/2018. 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 He went back to the angiography suite on 04/30/2018 he underwent PTA and balloon angioplasty of the occluded left mid anterior tibial artery, thrombotic occlusion went from 100 to 0% which reconstitutes the posterior tibial artery. He had thrombectomy and aspiration of the peroneal artery. The stent placed in the distal SFA left SFA was still occluded. 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 He has been recuperating at [facility] in [facility] after the most recent hospitalization. He is going home tomorrow. He tells me that 2 weeks ago he traumatized the tip of his left fifth toe. He came in urgently for our review of this. This was a history of before I noted that [name] had already noted dry gangrenous changes of the left fifth toe 06/09/2018; 2-week follow-up. I did contact [name] after his last appointment and he apparently saw 1 of Dr. Verl Dicker colleagues the next day. He does not follow-up with [name] himself until Thursday of this week. 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 Nevertheless there is no evidence of infection no drainage and no pain. He had a new area that this week when we were signing him in today on the left anterior mid tibia area, this is in close proximity to the previous wound we have dealt with in this clinic. 06/23/2018; 2-week follow-up. I did not receive a recent note from [name] to review today. Our office is trying to obtain this. He is  apparently not planning to do further vascular interventions and wondered about compression to try and help with the patient's chronic venous insufficiency. However we are also concerned about the arterial flow. He arrives in clinic today with a new  area on the left third toe. The areas on the calf/anterior tibia are close to closing. The left fifth toe is still mummified using Betadine. -In reviewing things with the patient he has what sounds like claudication with mild to moderate amount of activity. 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 I prescribed Levaquin over the phone while we attempted to arrange a plan of care. However the patient called yesterday to report he had low-grade fever and he came in today acutely. There is been a marked deterioration in the left third toe with spreading cellulitis up into the dorsal left foot. He was referred to the emergency room. Readmission: 06/29/2020 patient presents today for reevaluation here in our clinic he was previously treated by Dr. Leanord Hawking at the latter part of 2019 in 2 the beginning of 2020. Subsequently we have not seen him since that time in the interim he did have evaluation with vein and vascular specialist specifically Dr. Bo Mcclintock who did perform quite extensive work for a left femoral to anterior tibial artery bypass. With that being said in the interim the patient has developed significant lymphedema and has wounds that he tells me have really never healed in regard to the incision site on the left leg. He also has multiple wounds on the feet for various reasons some of which is that he tends to pick at his feet. Fortunately there is no signs of active infection systemically at this time he does have MERIT, HERPEL ([id number]) [id number] Page 4 of 22 some wounds that are little bit deeper but most are fairly superficial he seems to have good blood flow and overall  everything appears to be healthy I see no bone exposed and no obvious signs of osteomyelitis. I do not know that he necessarily needs a x-ray at this point although that something we could consider depending on how things progress. The patient does have a history of lymphedema, diabetes, this is type II, chronic kidney disease stage III, hypertension, and history of peripheral vascular disease. 07/05/2020; patient admitted last week. Is a patient I remember from 2019 he had a spreading infection involving the left foot and we sent him to the hospital. 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 He subsequently had a left femoral to anterior tibial bypass by [facility]. He also has severe lymphedema with chronic skin changes related to that on the left leg. The most problematic area that was new today was on the left medial great toe. This was apparently a small area last week there was purulent drainage which our intake nurse cultured. Also areas on the left medial foot and heel left lateral foot. He has 2 areas on the left medial calf left lateral calf in the setting of the severe lymphedema. 07/13/2020 on evaluation today patient appears to be doing better in my opinion compared to his last visit. The good news is there is no signs of active infection systemically and locally I do not see any signs of infection either. He did have an x-ray which was negative that is great news he had a culture which showed MRSA but at the same time he is been on the doxycycline which has helped. I do think we may want to extend this for 7 additional days 1/25; patient admitted to the clinic a few weeks ago. 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 We have been putting him under compression his edema control is a lot better but he is severe verricused skin on the left leg. He is really done quite well he still has an  open area on the left medial calf and the left medial first metatarsal head. We have been using silver collagen on the leg silver alginate on the foot 07/27/2020 upon evaluation today patient appears to be doing decently well in regard to his wounds. He still has a lot of dry skin on the left leg. Some of this is starting to peel back and I think he may be able to have them out by removing some that today. Fortunately there is no signs of active infection at this time on the left leg although on the right leg he does appear to have swelling and erythema as well as some mild warmth to touch. This does have been concerned about the possibility of cellulitis although within the differential diagnosis I do think that potentially a DVT has to be at least considered. We need to rule that out before proceeding would just call in the cellulitis. Especially since he is having pain in the posterior aspect of his calf muscle. 2/8; the patient had seen sparingly. He has severe skin changes of chronic lymphedema in the left leg thickened hyperkeratotic verrucous skin. He has an open wound on the medial part of the left first met head left mid tibia. He also has a rim of nonepithelialized skin in the anterior mid tibia. He brought in the AmLactin lotion that was been prescribed although I am not sure under compression and its utility. There concern about cellulitis on the right lower leg the last time he was here. He was put on on antibiotics. His DVT rule out was negative. The right leg looks fine he is using his stocking on this area 08/10/2020 upon evaluation today patient appears to be doing well with regard to his leg currently. He has been tolerating the dressing changes without complication. Fortunately there is no signs of active infection which is great news. Overall very pleased with where things stand. 2/22; the patient still has an area on the medial part of the left first met his head. This looks better  than when I last saw this earlier this month he has a rim of epithelialization but still some surface debris. Mostly everything on the left leg is healed. There is still a vulnerable in the left mid tibia area. 08/30/2020 upon evaluation today patient appears to be doing much better in regard to his wounds on his foot. 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 Nonetheless I think we will address that today I Minna send in a prescription for him in that regard. Overall though there does not appear to be any signs of significant worsening. 09/07/2020 on evaluation today patient's wounds over his left foot appear to be doing excellent. I do not see any signs of infection there is some callus buildup this can require debridement for certain but overall I feel like he is managing quite nicely. He still using the AmLactin cream which has been beneficial for him as well. 3/22; left foot wound is closed. There is no open area here. He is using ammonium lactate lotion to the lower extremities to help exfoliate dry cracked skin. He has compression stockings from  elastic therapy in [place]. The wound on the medial part of his left first met head is healed today. READMISSION 04/12/2021 Mr. Nolt is a patient we know fairly well he had a prolonged stay in clinic in 2019 with wounds on his left lateral and left anterior lower extremity in the setting of chronic venous insufficiency. More recently he was here earlier this year with predominantly an area on his left foot first metatarsal head plantar and he says the plantar foot broke down on its not long after we discharged him but he did not come back here. The last few months areas of broken down on his left anterior and again the left lateral lower extremity. The leg itself is very swollen chronically enlarged a lot of  hyperkeratotic dry Berry Q skin in the left lower leg. His edema extends well into the thigh. He was seen by Dr. Randie Heinz. He had ABIs on 03/02/2021 showing an ABI on the right of 1 with a TBI of 0.72 his ABI in the left at 1.09 TBI of 0.99. Monophasic and biphasic waveforms on the right. On the left monophasic waveforms were noted he went on to have an angiogram on 03/27/2021 this showed the aortic aortic and iliac segments were free of flow-limiting stenosis the left common femoral vein to evaluate the left femoral to anterior tibial artery bypass was unobstructed the bypass was patent without any areas of stenosis. We discharged the patient in bilateral juxta lite stockings but very clearly that was not sufficient to control the swelling and maintain skin integrity. He is clearly going to need compression pumps. 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 This is fortunate because he is on his feet for long periods of time. 10/27; patient comes in with our intake nurse reporting copious amount of green drainage from the left anterior mid tibia the left dorsal foot and to a lesser extent the left medial mid tibia. We left the compression wrap on all week for the amount of edema in his left leg is quite a bit better. We use silver alginate as the primary dressing 11/3; edema control is good. Left anterior lower leg left medial lower leg and the plantar first metatarsal head. The left anterior lower leg required debridement. Deep tissue culture I did of this wound showed MRSA I put him on 10 days of doxycycline which she will start today. We have him in compression wraps. He has a security card and [employer] however he is retiring on November 15. We will need to then get him into a better offloading boot for the left foot perhaps a total contact cast 11/10; edema control is quite good. Left anterior and left medial lower leg wounds in the setting of chronic  venous insufficiency and lymphedema. He also has a substantial area over the left plantar first metatarsal head. 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 He has significant hypergranulation on the left plantar foot wound. The patient is a diabetic but he does not have significant PAD 11/17; edema control is quite good. Left anterior and left medial lower leg wounds look better. The really concerning area remains the area on the left plantar first metatarsal head. He has a rim of epithelialization. He has been using a surgical shoe The patient is now retired from a a [employer]  I have gone over with him the need to offload this area aggressively. Starting today with a forefoot off loader but . possibly a total contact cast. 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 Arrives with a very significant odor from I think all of the wounds on the left leg and the left foot. Because of this I did not put a total contact cast on him today but will could still consider this. His wife was having cataract surgery which is the reason he missed the appointment 12/6. I saw this man 5 days ago with a swelling below the popliteal fossa. I thought he actually might have a Baker's cyst however the DVT rule out study that we could arrange right away was negative the technician told me this was not a ruptured Baker's cyst. We attempted to get this aspirated by under ultrasound guidance in interventional radiology however all they did was an ultrasound however it shows an extensive fluid collection 62 x 8 x 9.4 in the left thigh and left calf. The patient states he thinks this started 8 days ago or so but he really is not complaining of any pain, fever or systemic symptoms. He has not ha 12/20; after some difficulty I  managed to get the patient into see Dr. Randie Heinz. Eventually he was taken into the hospital and had a drain put in the fluid collection below his left knee posteriorly extending into the posterior thigh. He still has the drain in place. Culture of this showed moderate staff aureus few Morganella [name], [name] ([id number]) [id number] Page 5 of 22 and few Klebsiella he is now on doxycycline and ciprofloxacin as suggested by infectious disease he is on this for a month. The drain will remain in place until it stops draining 12/29; he comes in today with the 1 wound on his left leg and the area on the left plantar first met head significantly smaller. Both look healthy. He still has the drain in the left leg. He says he has to change this daily. Follows up with Dr. Randie Heinz on January 11. 06/29/2021; the wounds that I am following on the left leg and left first met head continued to be quite healthy. However the area where his inferior drain is in place had copious amounts of drainage which was green in color. The wound here is larger. Follows up with Dr. Pascal Lux of vein and vascular his surgeon next week as well as infectious disease. He remains on ciprofloxacin and doxycycline. He is not complaining of excessive pain in either one of the drain areas 1/12; the patient saw vascular surgery and infectious disease. Vascular surgery has left the drain in place as there was still some notable drainage still see him back in 2 weeks. Dr. Dorthula Perfect stop the doxycycline and ciprofloxacin and I do not believe he follows up with them at this point. Culture I did last week showed both doxycycline resistant MRSA and Pseudomonas not sensitive to ciprofloxacin although only in rare titers 1/19; the patient's wound on the left anterior lower leg is just about healed. We have continued healing of the area that was medially on the left leg. Left first plantar metatarsal head continues to get smaller. The  major problem here is his 2 drain sites 1 on the left upper calf and lateral thigh. There is purulent drainage still from  the left lateral thigh. I gave him antibiotics last week but we still have recultured. He has the drain in the area I think this is eventually going to have to come out. I suspect there will be a connecting wound to heal here perhaps with improved VAc 1/26; the patient had his drain removed by vein and vascular on 1/25/. This was a large pocket of fluid in his left thigh that seem to tunnel into his left upper calf. 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 His mention his Penrose drain was removed today. He now has a tunneling wound on his left calf and left thigh. Both of these probe widely towards each other although I cannot really prove that they connect. Both wounds on his lower leg anteriorly are closed and his area over the first metatarsal head on his right foot continues to improve. We are using Hydrofera Blue here. He also saw infectious disease culture of the abscess they noted was polymicrobial with MRSA, Morganella and Klebsiella he was treated with doxycycline and ciprofloxacin for 4 weeks ending on 07/03/2021. They did not recommend any further antibiotics. Notable that while he still had the Penrose drain in place last week he had purulent drainage coming out of the inferior IandD site this grew [facility], MRSA and Pseudomonas but there does not appear to be any active infection in this area today with the drain out and he is not systemically unwell 2/2; with regards to the drain sites the superior one on the thigh actually is closed down the one on the upper left lateral calf measures about 8 and half centimeters which is an improvement seems to be less prominent although still with a lot of drainage. The only remaining wound is over the first metatarsal head on the left foot and this looks to be continuing to improve with Hydrofera Blue. 2/9;  the area on his plantar left foot continues to contract. Callus around the wound edge. The drain sites specifically have not come down in depth. We put the wound VAC on Monday he changed the canister late last night our intake nurse reported a pocket of fluid perhaps caused by our compression wraps 2/16; continued improvement in left foot plantar wound. drainage site in the calf is not improved in terms of depth (wound vac) 2/23; continued improvement in the left foot wound over the first metatarsal head. With regards to the drain sites the area on his thigh laterally is healed however the open area on his calf is small in terms of circumference by still probes in by about 15 cm. Within using the wound VAC. Hydrofera Blue on his foot 08/24/2021: The left first metatarsal head wound continues to improve. The wound bed is healthy with just some surrounding callus. Unfortunately the open drain site on his calf remains open and tunnels at least 15 cm (the extent of a Q-tip). This is despite several weeks of wound VAC treatment. Based on reading back through the notes, there has been really no significant change in the depth of the wound, although the orifice is smaller and the more cranial wound on his thigh has closed. I suspect the tunnel tracks nearly all the way to this location. 08/31/2021: Continued improvement in the left first metatarsal head wound. There has been absolutely no improvement to the long tunnel from his open drain site on his calf. We have tried to get him into see vascular surgery sooner to consider the possibility of  simply filleting the tract open and allowing it to heal from the bottom up, likely with a wound VAC. They have not yet scheduled a sooner appointment than his current mid [name] 09/14/2021: He was seen by vascular surgery and they took him to the operating room last week. They opened a portion of the tunnel, but did not extend the entire length of the known open  subcutaneous tract. I read Dr. Darcella Cheshire operative note and it is not clear from that documentation why only a portion of the tract was opened. The heaped up granulation tissue was curetted and removed from at least some portion of the tract. They did place a wound VAC and applied an Unna boot to the leg. The ulcer on his left first metatarsal head is smaller today. The bed looks good and there is just a small amount of surrounding callus. 09/21/2021: The ulcer on his left first metatarsal head looks to be stalled. There is some callus surrounding the wound but the wound bed itself does not appear particularly dynamic. The tunnel tract on his lateral left leg seems to be roughly the same length or perhaps slightly smaller but the wound bed appears healthy with good granulation tissue. He opened up a new wound on his medial thigh and the site of a prior surgical incision. He says that he did this unconsciously in his sleep by scratching. 09/28/2021: Unfortunately, the ulcer on his left first metatarsal head has extended underneath the callus toward the dorsum of his foot. The medial thigh wounds are roughly the same. The tunnel on his lateral left leg continues to be problematic; it is longer than we are able to actually probe with a Q-tip. I am still not certain as to why Dr. Randie Heinz did not open this up entirely when he took the patient to the operating room. We will likely be back in the same situation with just a small superficial opening in a long unhealed tract, as the open portion is granulating in nicely. 10/02/2021: The patient was initially scheduled for a nurse visit, but we are also applying a total contact cast today. The plantar foot wound looks clean without significant accumulated callus. We have been applying Prisma silver collagen to the site. 10/05/2021: The patient is here for his first total contact cast change. We have tried using gauze packing strips in the tunnel on his lateral leg wound,  but this does not seem to be working any better than the white VAC foam. The foot ulcer looks about the same with minimal periwound callus. Medial thigh wound is clean with just some overlying eschar. 10/12/2021: The plantar foot wound is stable without any significant accumulation of periwound callus. The surface is viable with good granulation tissue. The medial thigh wounds are much smaller and are epithelializing. On the other hand, he had purulent drainage coming from the tunnel on his lateral leg. He does go back to see Dr. Randie Heinz next week and is planning to ask him why the wound tunnel was not completely opened at the time of his most recent operation. 10/19/2021: The plantar foot wound is markedly improved and has epithelial tissue coming through the surface. The medial thigh wounds are nearly closed with just a tiny open area. He did see Dr. Randie Heinz earlier this week and apparently they did discuss the possibility of opening the sinus tract further and enabling a wound VAC application. Apparently there are some limits as to what Dr. Randie Heinz feels comfortable opening, presumably in relationship  to his bypass graft. I think if we could get the tract open to the level of the popliteal fossa, this would greatly aid in her ability to get this chart closed. That being said, however, today when I probed the tract with a Q-tip, I was not able to insert the entirety of the Q-tip as I have on previous occasions. The tunnel is shorter by about 4 cm. The surface is clean with good granulation tissue and no further episodes of purulent drainage. 10/30/2021: Last week, the patient underwent surgery and had the long tract in his leg opened. There was a rind that was debrided, according to the operative report. His medial thigh ulcers are closed. The plantar foot wound is clean with a good surface and some built up surrounding callus. 11/06/2021: The overall dimensions of the large wound on his lateral leg remain about  the same, but there is good granulation tissue present and the tunneling is [name], [name] ([id number]) [id number] Page 6 of 22 a little bit shorter. He has a new wound on his anterior tibial surface, in the same location where he had a similar lesion in the past. The plantar foot wound is clean with some buildup surrounding callus. Just toward the medial aspect of his foot, however, there is an area of darkening that once debrided, revealed another opening in the skin surface. 11/13/2021: The anterior tibial surface wound is closed. The plantar foot wound has some surrounding callus buildup. The area of darkening that I debrided last week and revealed an opening in the skin surface has closed again. The tunnel in the large wound on his lateral leg has come in by about 3 cm. There is healthy granulation tissue on the entire wound surface. 11/23/2021: The patient was out of town last week and did wet-to-dry dressings on his large wound. 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 Unfortunately, he picked open the wound on his left medial thigh. He says that it was itching and he just could not stop scratching it until it was open again. The wound on his plantar foot is smaller and has not accumulated a tremendous amount of callus. The lateral leg wound is shallower and the tunnel has also decreased in depth. There is just a little bit of slough accumulation on the surface. 11/30/2021: Another portion of his left medial thigh has been opened up. All of these wounds are fairly superficial with just a little bit of slough and eschar accumulation. The wound on his plantar foot is almost closed with just a bit of eschar and periwound callus accumulation. The lateral leg wound is nearly flush with the surrounding skin and the tunnel is markedly shallower. 12/07/2021: There is just 1 open area on his left medial thigh. It  is clean with just a little bit of perimeter eschar. The wound on his plantar foot continues to contract and just has some eschar and periwound callus accumulation. The lateral leg wound is closing at the more distal aspect and the tunnel is smaller. The surface is nearly flush with the surrounding skin and it has a good bed of granulation tissue. 12/14/2021: The thigh and foot wounds are closed. The lateral leg wound has closed over approximately half of its length. The tunnel continues to contract and the surface is now flush with the surrounding skin. The wound bed has robust granulation tissue. 12/22/2021: The thigh and foot wounds have reopened. The  foot wound has a lot of callus accumulation around and over it. The thigh wound is tiny with just a little bit of slough in the wound bed. The lateral leg wound continues to contract. His vascular surgeon took the wound VAC off earlier in the week and the patient has been doing wet-to-dry dressings. There is a little slough accumulation on the surface. The tunnel is about 3 cm in depth at this point. 12/28/2021: The thigh wound is closed again. The foot wound has some callus that subsequently has peeled back exposing just a small slit of a wound. The lateral leg wound Is down to about half the size that it originally was and the tunnel is down to about half a centimeter in depth. 01/04/2022: The thigh wound remains closed. The foot wound has heavy callus overlying the wound site. Once this was debrided, the wound was found to be closed. The lateral leg wound is smaller again this week and very superficial. No tunnel could be identified. 01/12/2022: The thigh and foot wounds both remain closed. The lateral leg wound is now nearly flush with the skin surface. There is good granulation tissue present with a light layer of slough. 01/19/2022: Due to the way his wrap was placed, the patient did not change the dressing on his thigh at all and so the foam was  saturated and his skin is macerated. There is a light layer of slough on the wound surface. The underlying granulation tissue is robust and healthy-appearing. He has heavy callus buildup at the site of his first metatarsal head wound which is still healed. 02/01/2022: He has been in silver alginate. When he removed the dressing from his thigh wound, however, some leg, superficially reopening a portion of the wound that had healed. In addition, underneath the callus at his left first metatarsal head, there appears to be a blister and the wound appears to be open again. 02/08/2022: The lateral leg wound has contracted substantially. There is eschar and a light layer of slough present. He says that it is starting to pull and is uncomfortable. On inspection, there is some puckering of the scar and the eschar is quite dry; this may account for his symptoms. On his first metatarsal head, the wound is much smaller with just some eschar on the surface. The callus has not reaccumulated. He reports that he had a blister come up on his medial thigh wound at the distal aspect. It popped and there is now an opening in his skin again. Looking back through his library of wound photos, there is what looks like a permanent suture just deep to this location and it may be trying to erode through. We have been using silver alginate on his wounds. 02/15/2022: The lateral leg wound is about half the size it was last week. It is clean with just a little perimeter eschar and light slough. The wound on his first metatarsal head is about the same with heavy callus overlying it. The medial thigh wound is closed again. He does have some skin changes on the top of his foot that looks potentially yeast related. 02/22/2022: The skin on the top of his foot improved with the use of a topical antifungal. The lateral leg wound continues to contract and is again smaller this week. There is a little bit of slough and eschar on the surface.  The first metatarsal head wound is a little bit smaller but has reaccumulated a thick callus over the top. He decided to  try to trim his toenail and ultimately took the entire nail off of his left great toe. 03/02/2022: His lateral leg wound continues to improve, as does the wound on his left great toe. Unfortunately, it appears that somehow his foot got wet and moisture seeped in through the opening causing his skin to lift. There is a large wound now overlying his first metatarsal on both the plantar, medial, and dorsal portion of his foot. There is necrotic tissue and slough present underneath the shaggy macerated skin. 03/08/2022: The lateral leg wound is smaller again today. There is just a light layer of slough and eschar on the surface. The great toe wound is smaller again today. The first metatarsal wound is a little bit smaller today and does not look nearly as necrotic and macerated. There is still slough and nonviable tissue present. 03/15/2022: The lateral leg wound is narrower and just has a little bit of light slough buildup. The first metatarsal wound still has a fair amount of moisture affecting the periwound skin. The great toe wound is healed. 03/22/2022: The lateral leg wound is now isolated to just at the level of his knee. There is some eschar and slough accumulation. The first metatarsal head wound has epithelialized tremendously and is about half the size that it was last week. He still has some maceration on the top of his foot and a fungal odor is present. 03/29/2022: T oday the patient's foot was macerated, suggesting that the cast got wet. The patient has also been picking at his dry skin and has enlarged the wound on his left lateral leg. In the time between having his cast removed and my evaluation, he had picked more dry skin and opened up additional wounds on his Achilles area and dorsal foot. The plantar first metatarsal head wound, however, is smaller and clean with just  macerated callus around the perimeter and light slough on the surface. The lateral leg wound measured a little bit larger but is also fairly clean with eschar and minimal slough. 04/02/2022: The patient had vascular studies done last Friday and so his cast was not applied. He is here today to have that done. 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 04/05/2022: Both wounds are smaller and quite clean. There is just a little biofilm on the lateral leg wound. 10/20; the patient has a wound on the left lateral surgical incision at the level of his lateral knee this looks clean and improved. He is using silver alginate. He also has an area on his left medial foot for which she is using Hydrofera Blue under a total contact cast both wounds are measuring smaller 04/20/2022: The plantar foot wound has contracted considerably and is very close to closing. The lateral leg wound was measured a little larger, but there was a tiny open area that was included in the measurements that was not included last week. He has some eschar around the perimeter but otherwise the wound looks clean. 04/27/2022: The lateral leg wound looks better this week. He says that midweek, he felt it was very dry and began applying hydrogel to the site. I think this was beneficial. The foot wound is nearly closed underneath a thick layer of dry skin and callus. [name], [name] ([id number]) [id number] Page 7 of 22 05/04/2022: The foot wound is healed. He has developed a new small ulcer on his anterior tibial surface about midway up his leg. It has a little slough on the surface. The lateral  leg wound still is fairly dry, but clean with just a little biofilm on the surface. 05/11/2022: The wound on his foot reopened on Wednesday. A large blister formed which then broke open revealing the fat layer underneath. The ulcer on his anterior tibial surface is a little bit larger this week. The lateral leg  wound has much better moisture balance this week. Fortunately, prior to his foot wound reopening, he did get the cast made for his orthotic. 05/15/2022: Already, the left medial foot wound has improved. The tissue is less macerated and the surface is clean. The ulcer on his anterior tibial surface continues to enlarge. This seems likely secondary to accumulated moisture. The lateral leg wound continues to have an improved moisture balance with the use of collagen. 05/25/2022: The medial foot wound continues to contract. It is now substantially smaller with just a little slough on the surface. The anterior tibial surface wound continues to enlarge further. Once again, this seems to be secondary to moisture. The lateral leg wound does not seem to be changing much in size, but the moisture balance is better. 06/01/2022: The anterior tibial wound is closed. The medial foot wound is down to just a very small, couple of millimeters, opening. The lateral leg wound has good moisture balance, but remains unchanged in size. 12/15; the patient's anterior tibial wound has reopened, however the area on his right first metatarsal head is closed. The major wound is actually on the superior part of his surgical wound in the left lateral thigh. Not a completely viable surface under illumination. This may at some point require a debridement I think he is currently using Prisma. As noted the left medial foot wound has closed 06/14/2022: The anterior tibial wound has closed. The lateral leg wound has a better surface but is basically unchanged in size. The left medial foot wound has reopened. It looks as though there was some callus accumulation and moisture got under the callus which caused the tissue to break down again. 06/21/2022: A new wound has opened up just distal to the previous anterior tibial wound. It is small but has hypertrophic granulation tissue present. The lateral leg wound is a little bit narrower and  has a layer of slough on the surface. The left medial foot wound is down to just a pinhole. His custom orthotics should be available next week. 06/28/2022: The wound on his first metatarsal head has healed. He has developed a new small wound on his medial lower leg, in an old scar site. The lateral leg wound continues to contract but continues to accumulate slough, as well. 07/03/2022: Despite wearing his custom orthopedic shoes, he managed to reopen the wound on his first metatarsal head. He says he thinks his foot got wet and then some skin lifted up and he peeled this away. Both of the lower leg wounds are smaller and have some dry eschar on the surface. The lateral leg wound is quite a bit narrower today. 07/12/2022: The medial lower leg wound is closed. The anterior lower leg wound has contracted considerably. The lateral upper leg wound is narrower with a layer of slough on the surface. The first metatarsal head wound is also smaller, but had copious drainage which saturated the foam border dressing and resulted in some periwound tissue maceration. Fortunately there was no breakdown at this site. 07/19/2022: The lower leg shows signs of significant maceration; I think he must be sweating excessively inside his cast. There are several areas of skin breakdown  present. The wound on his foot is smaller and that on his lateral leg is narrower and is shorter by about a centimeter. 07/26/2022: Last week we used a zinc Coflex wrap prior to applying his total contact cast and this has had the effect of keeping his skin from getting macerated this week. The anterior leg wound has epithelialized substantially. The lateral leg wound is significantly smaller with just a bit of slough on the surface. The first metatarsal head wound is also smaller this week. 08/02/2022: The anterior leg wound was closed on arrival, but while he was sitting in the room, he picked it open again. The lateral leg wound is smaller with just  a little slough on the surface and the first metatarsal head wound has contracted further, as well. 08/09/2022: The first metatarsal head wound is covered with callus. Underneath the callus, it is nearly completely closed. The lateral leg wound is smaller again this week. The anterior leg wound looks better, but he has such heavy buildup of old skin, that moisture is getting underneath this, becoming trapped, and causing the underlying skin to get macerated and open up. 08/16/2022: The first metatarsal head wound is closed. The lateral leg wound continues to contract and is quite a bit smaller again this week. There is just a small, superficial opening remaining on his anterior tibial surface. 08/23/2022: The first metatarsal head wound has, by some miracle, remained closed. The lateral leg wound is substantially smaller with multiple areas of epithelialization. The anterior tibial surface wound is also quite a bit smaller and very clean. 08/30/2022: Unfortunately, his first metatarsal head wound opened up again. It happened in the same fashion as it has on prior occasions. Moisture got under dried skin/callus and created a wound when he removed his sock, taking the skin with it. The anterior tibial surface has a thick shell of hyperkeratotic skin. This has been contributing to ongoing repeat wounding events as moisture gets underneath this and causes tissue breakdown. 3/15; patient presents for follow-up. His anterior left leg wound has healed. He still has the wound to the left lateral aspect and left first met head. We have been using silver alginate and endoform to these areas under [employer]. He has no issues or complaints today. He has been taking Augmentin and reports improvement to his symptoms to the left first met head. 09/13/2022: He has accumulated more thick dry skin in sheets on his lower leg. The lateral leg wound is about the same size and the left first metatarsal head wound is a little  bit smaller. There is slough on both surfaces. There is callus buildup around the foot wound. 09/20/2022: The lateral leg wound is a little bit narrower and the left first metatarsal head wound also seems to have contracted slightly. There is slough on both surfaces. He has a little skin breakdown on his anterior tibial surface. 09/27/2022: The lateral leg wound continues to contract and is quite clean. The first metatarsal head wound is also smaller. There is some perimeter callus and slough accumulation on the foot. The anterior tibial surface is closed. 10/04/2022: Both of his wounds are smaller today, particularly the first metatarsal head wound. 10/18/2022: He missed his appointment last week and ended up cutting off his wrap on Saturday. The anterior tibial wound reopened. It is fairly superficial with a little bit of slough on the surface. His lateral leg wound is smaller with some slough and eschar buildup. The first metatarsal head wound is also  smaller with some callus and slough accumulation. 10/25/2022: All wounds are smaller. There is slough and eschar on the lateral leg and slough and callus on the plantar foot wound. The anterior tibial wound is clean and flush with the surrounding skin. No debris accumulation here. 11/01/2022: The wounds are all smaller again this week. There is slough on the lateral leg and some minimal slough and eschar on the anterior tibial wound. There is callus accumulation on the first metatarsal head site, along with slough. There is also a yeasty odor coming from the foot. 11/08/2022: The lateral leg wound is smaller except where he picked some skin while waiting to be seen. There is a little bit of slough on the surface. The anterior JANTE, VALBUENA ([id number]) [id number] Page 8 of 22 tibial wound is closed. The first metatarsal head site has gotten macerated once again and has a lot of spongy wet tissue and callus around it. No yeast  odor today. 11/15/2022: The lateral leg wound continues to contract. There is now a band of epithelium dividing it into 2 areas. Minimal slough accumulation. He managed to pick open a new wound on his medial lower leg. The first metatarsal head site is smaller with some callus accumulation but no tissue maceration. 11/22/2022: The lateral leg wound is smaller again by about a third today. There is a little bit of slough on the surface with some periwound eschar. The new wound that he picked open last week has healed but he picked open 3 new small wounds on his anterior tibial surface, once again due to his picking at his skin. The first metatarsal head wound looks about the same. There has been some callus accumulation and there is more edema present, as he was not put in a compression wrap last week. 11/29/2022: The lateral leg wound is smaller again today. There is a little periwound eschar and some slough present. The wounds on his anterior tibial surface are all closed except for 1 that has a little bit of slough on the open portion with eschar covering it. The first metatarsal head wound measured a little bit smaller today, but mostly looks about the same. Edema control is better. 12/06/2022: The anterior tibial wound is closed. The lateral leg wound is smaller with some slough and eschar accumulation. The plantar foot ulcer is about the same size, but the tissue does show some evidence of the fact that he was on his feet quite a bit more this past week; there was a death in his family. There has been more callus accumulation and the tissues are little bit more purpleish. 12/13/2022: He picked the skin on his anterior tibia and reopened a wound there while he was waiting to be seen in clinic. The lateral leg wound is much smaller with a little bit of slough and eschar. The plantar foot ulcer is smaller today with callus and slough buildup, but without the pressure induced tissue injury that was seen  last week. 12/20/2022: The anterior tibial wound has healed. The lateral leg wound is smaller again this week. There is a little bit of eschar buildup on the surface. The foot had a fairly strong odor coming from it that persisted even after washing. The wound itself looks like its gotten a little smaller but he has built up thick callus, once again. 12/26/2022: The lateral leg wound is down to just a narrow superficial slit. There is slough and a little bit of dry skin present. The foot is  in much better shape today. There is less callus accumulation and no odor. The skin edges are starting to roll inward, however. 01/03/2023: The lateral leg wound is healed. The skin is quite dry but the wound has closed. The foot continues to contract. He has a fair amount of periwound callus accumulation and the surface of the is a little drier than ideal. 01/10/2023: The large lateral leg wound remains closed. He managed to pick off some of his dry skin and has multiple small open superficial wounds on his lower leg. The foot wound measures smaller, but he has a blister immediately adjacent to it. 01/17/2023: The large lateral leg wound remains closed. The small superficial wounds on his lower leg have also closed. The foot wound is about the same and the blister area immediately adjacent to it has dark eschar on the surface. The foot wound looks a bit dry. 01/24/2023: He picked at some dry skin on his lateral leg wound and reopened. The surface is dry and fibrotic. The foot wound is slightly smaller but has periwound callus and the edges are rolling inward again. 01/31/2023: His lateral leg wound is larger again today. It appears that the Ace bandage may be rubbing and causing some tissue breakdown. His foot wound is also a bit larger and the tissue does not appear particularly healthy. 02/07/2023: The lateral leg wound improved quite significantly over the last week. He has been applying additional gauze over the site  before applying the Ace bandage and this seems to have minimized the rubbing. The foot wound is about the same. The culture that I took last week returned with a polymicrobial population of predominantly skin flora. He is currently taking Augmentin as recommended by the culture data. 02/14/2023: The lateral leg wound is covered in a layer of eschar. Underneath, the wound is nearly healed again. The foot wound looks better today. The depth around the edges has decreased. The tissue has a healthier appearance. He is still taking Augmentin. 02/21/2023: The lateral leg wound is smaller with a layer of eschar on the surface, but is not quite healed. The foot wound is stable but the depth around the edges is almost completely obliterated. There is some callus and senescent skin around the edges. Electronic Signature(s) Signed: 02/21/2023 10:33:16 AM By: [name] Guess MD FACS Entered By: [name] on 02/21/2023 10:33:16 -------------------------------------------------------------------------------- Physical Exam Details Patient Name: Date of Service: [name]. 02/21/2023 10:00 A M Medical Record Number: [id number] Patient Account Number: [id number] Date of Birth/Sex: Treating RN: [name] (72 y.o. M) Primary Care Provider: Ralene [name] Other Clinician: Referring Provider: Treating Provider/Extender: [name] in Treatment: 97 Constitutional Slightly hypertensive. . . . no acute distress. Respiratory Normal work of breathing on room air. Notes DEKLIN, KILMARTIN ([id number]) [id number] Page 9 of 22 02/21/2023: The lateral leg wound is smaller with a layer of eschar on the surface, but is not quite healed. The foot wound is stable but the depth around the edges is almost completely obliterated. There is some callus and senescent skin around the edges. Electronic Signature(s) Signed: 02/21/2023 10:33:48 AM By: [name] Guess MD  FACS Entered By: [name] on 02/21/2023 10:33:48 -------------------------------------------------------------------------------- Physician Orders Details Patient Name: Date of Service: [name]. 02/21/2023 10:00 A M Medical Record Number: [id number] Patient Account Number: [id number] Date of Birth/Sex: Treating RN: [name] (72 y.o. 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 - Dr [name] - Room 2 Return A Anesthetic (In clinic) Topical Lidocaine 5% applied to wound bed Bathing/ Shower/ Hygiene May shower with protection but do not get wound dressing(s) wet. Protect dressing(s) with water repellant cover (for example, large plastic bag) or a cast cover and may then take shower. Edema Control - Lymphedema / SCD / Other Elevate legs to the level of the heart or above for 30 minutes daily and/or when sitting for 3-4 times a day throughout the day. Avoid standing for long periods of time. Patient to wear own compression stockings every day. - both legs daily Moisturize legs daily. Compression stocking or Garment 20-30 mm/Hg pressure to: Off-Loading Wound #18R Left,Plantar Metatarsal head first Open toe surgical shoe to: - Front off loader Left ft Other: - minimal weight bearing left  foot Additional Orders / Instructions Follow Nutritious Diet - vitamin C 500 mg 3 times a day and zinc 30-50 mg a day Wound Treatment Wound #18R - Metatarsal head first Wound Laterality: Plantar, Left Cleanser: Soap and Water 1 x Per Week/30 Days Discharge Instructions: May shower and wash wound with dial antibacterial soap and water prior to dressing change. Cleanser: Wound Cleanser 1 x Per Week/30 Days Discharge Instructions: Cleanse the wound with wound cleanser prior to applying a clean dressing using gauze sponges, not tissue or cotton balls. Peri-Wound Care: Ketoconazole Cream 2% 1 x Per Week/30 Days Discharge Instructions: Apply Ketoconazole as directed Peri-Wound Care: Triamcinolone 15 (g) 1 x Per Week/30 Days Discharge Instructions: Use triamcinolone 15 (g) as directed [name] ([id number]) [id number] Page 10 of 22 Topical: Gentamicin 1 x Per Week/30 Days Discharge Instructions: As directed by physician Topical: Mupirocin Ointment 1 x Per Week/30 Days Discharge Instructions: Apply Mupirocin (Bactroban) as instructed Prim Dressing: Maxorb Extra Ag+ Alginate Dressing, 2x2 (in/in) 1 x Per Week/30 Days ary Discharge Instructions: Apply to wound bed as instructed Secondary Dressing: Optifoam Non-Adhesive Dressing, 4x4 in (Generic) 1 x Per Week/30 Days Discharge Instructions: Apply over primary dressing as directed. Secondary Dressing: Woven Gauze Sponges 2x2 in (Generic) 1 x Per Week/30 Days Discharge Instructions: Apply over primary dressing as directed. Secondary Dressing: Zetuvit Absorbent Pad, 4x4 (in/in) 1 x Per Week/30 Days Compression Wrap: Urgo K2, (equivalent to a 4 layer) two layer compression system, regular 1 x Per Week/30 Days Discharge Instructions: Apply Urgo K2 as directed (alternative to 4 layer compression). Wound #22R - Lower Leg Wound Laterality: Left, Lateral, Proximal Cleanser: Soap and Water 1 x Per Day/30 Days Discharge  Instructions: May shower and wash wound with dial antibacterial soap and water prior to dressing change. Cleanser: Wound Cleanser 1 x Per Day/30 Days Discharge Instructions: Cleanse the wound with wound cleanser prior to applying a clean dressing using gauze sponges, not tissue or cotton balls. Prim Dressing: Endoform 2x2 in 1 x Per Day/30 Days ary Discharge Instructions: Moisten with saline Secondary Dressing: Woven Gauze Sponge, Non-Sterile 4x4 in 1 x Per Day/30 Days Discharge Instructions: Apply over primary dressing as directed. Secured With: Elastic Bandage 4 inch (ACE bandage) 1  x Per Day/30 Days Discharge Instructions: Secure with ACE bandage as directed. Secured With: [employer], 4.5x3.1 (in/yd) 1 x Per Day/30 Days Discharge Instructions: Secure with Kerlix as directed. Patient Medications llergies: No Known Drug Allergies A Notifications Medication Indication Start End 02/21/2023 lidocaine DOSE topical 5 % ointment - ointment topical Electronic Signature(s) Signed: 02/21/2023 10:48:59 AM By: [name] Guess MD FACS Entered By: [name] on 02/21/2023 10:34:34 -------------------------------------------------------------------------------- Problem List Details Patient Name: Date of Service: [name]. 02/21/2023 10:00 A M Medical Record Number: [id number] Patient Account Number: [id number] Date of Birth/Sex: Treating RN: [birth year] (72 y.o. M) Primary Care Provider: Ralene [name] Other Clinician: Referring Provider: Treating Provider/Extender: [name] in Treatment: 13 Active Problems ICD-10 Encounter Code Description Active Date MDM RAMIEL, PERNICIARO ([id number]) [id number] Page 11 of 22 Code Description Active Date MDM Diagnosis L97.528 Non-pressure chronic ulcer of other part of left foot with other specified 08/26/2022 No Yes severity 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 01/24/2023 No Yes I87.322 Chronic venous hypertension (idiopathic) with inflammation of left lower 04/12/2021 No Yes extremity E11.51 Type 2 diabetes mellitus with diabetic peripheral angiopathy without gangrene 04/12/2021 No Yes I89.0 Lymphedema, not elsewhere classified 04/12/2021 No Yes L85.9 Epidermal thickening, unspecified 08/30/2022 No Yes Inactive Problems ICD-10 Code Description Active Date Inactive Date E11.621 Type 2 diabetes mellitus with foot ulcer 04/12/2021 04/12/2021 E11.42 Type 2 diabetes mellitus with diabetic polyneuropathy 04/12/2021 04/12/2021 L02.416 Cutaneous abscess of left lower limb 06/13/2021 06/13/2021 Resolved Problems ICD-10 Code Description Active Date Resolved Date L97.828 Non-pressure chronic ulcer of other part of left lower leg with other specified severity 04/12/2021 04/12/2021 G29.528 Non-pressure chronic ulcer of other part of left lower leg with other specified severity 01/10/2023 06/08/2022 Electronic Signature(s) Signed: 02/21/2023 10:28:37 AM By: [name] Guess MD FACS Entered By: [name] on 02/21/2023 10:28:37 -------------------------------------------------------------------------------- Progress Note Details Patient Name: Date of Service: [name]. 02/21/2023 10:00 A M Medical Record Number: [id number] Patient Account Number: [id number] Date of Birth/Sex: Treating RN: [name] (72 y.o. M) Primary Care Provider: Ralene [name] Other Clinician: Referring Provider: Treating Provider/Extender: [name] in Treatment: [address], Dola Factor ([id number]) [id number] Page 12 of 22 Chief Complaint Information obtained from Patient Left leg and foot ulcers 04/12/2021; patient is here for wounds on his left lower leg and left plantar foot over the first metatarsal head History of Present Illness (HPI) 10/11/17; Mr. Dufrene is a 72 year old man who tells me that  in 2015 he slipped down the latter traumatizing his left leg. He developed a wound in the same spot the area that we are currently looking at. He states this closed over for the most part although he always felt it was somewhat unstable. In 2016 he hit the same area with the door of his car had this reopened. He tells me that this is never really closed although sometimes an inflow it remains open on a constant basis. He has not been using any specific dressing to this except for topical antibiotics the nature of which were not really sure. His primary doctor did send him to see [name] of interventional cardiology. He underwent an angiogram on 08/06/17 and he underwent a PTA and directional atherectomy of the lesser distal SFA and popliteal arteries which resulted in brisk improvement in blood flow. It was noted that he had 2 vessel runoff through the anterior tibial and peroneal. He is also been to see  vascular and interventional radiologist. He was not felt to have any significant superficial venous insufficiency. Presumably is not a candidate for any ablation. It was suggested he come here for wound care. The patient is a type II diabetic on insulin. He also has a history of venous insufficiency. ABIs on the left were noncompressible in our clinic 10/21/17; patient we admitted to the clinic last week. 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 We put Iodosorb on him after an aggressive debridement and 3 layer compression. He complained of pain in his ankle and itching with is skin in fact he scratched the area on the medial calf superiorly at the rim of our wraps and he has 2 small open areas in that location today which are new. I changed his primary dressing today to silver collagen. As noted he is already had revascularization and does not have any significant superficial venous insufficiency that would be amenable to  ablation 10/28/17; patient admitted to the clinic 2 weeks ago. He has a smaller Wound. Scratch injury from last week revealed. There is large wound over the tibial area. This is smaller. Granulation looks healthy. No need for debridement. 11/04/17; the wound on the left lateral calf looks better. Improved dimensions. Surface of this looks better. We've been maintaining him and Kerlix Coban wraps. He finds this much more comfortable. Silver collagen dressing 11/11/17; left lateral Wound continues to look healthy be making progress. Using a #5 curet I removed removed nonviable skin from the surface of the wound and then necrotic debris from the wound surface. Surface of the wound continues to look healthy. He also has an open area on the left great toenail bed. We've been using topical antibiotics. 11/19/17; left anterior lateral wound continues to look healthy but it's not closed. He also had a small wound above this on the left leg Initially traumatic wounds in the setting of significant chronic venous insufficiency and stasis dermatitis 11/25/17; left anterior wounds superiorly is closed still a small wound inferiorly. 12/02/17; left anterior tibial area. Arrives today with adherent callus. Post debridement clearly not completely closed. Hydrofera Blue under 3 layer compression. 12/09/17; left anterior tibia. Circumferential eschar however the wound bed looks stable to improved. We've been using Hydrofera Blue under 3 layer compression 12/17/17; left anterior tibia. Apparently this was felt to be closed however when the wrap was taken off there is a skin tear to reopen wounds in the same area we've been using Hydrofera Blue under 3 layer compression 12/23/17 left anterior tibia. Not close to close this week apparently the [facility] was stuck to this again. Still circumferential eschar requiring debridement. I put a contact layer on this this time under the Hydrofera Blue 12/31/17; left anterior tibia.  Wound is better slight amount of hyper-granulation. Using Hydrofera Blue over Adaptic. 01/07/18; left anterior tibia. The wound had some surface eschar however after this was removed he has no open wound.he was already revascularized by [name] when he came to our clinic with atherectomy of the left SFA and popliteal artery. He was also sent to interventional radiology for venous reflux studies. He was not felt to have significant reflux but certainly has chronic venous changes of his skin with hemosiderin deposition around this area. He will definitely need to lubricate his skin and wear compression stocking and I've talked to him about this. READMISSION 05/26/2018 This is a now 72 year old man we cared for with  traumatic wounds on his left anterior lower extremity. He had been previously revascularized during that admission by [name]. Apparently in follow-up [name] noted that he had deterioration in his arterial status. He underwent a stent placement in the distal left SFA on 04/22/2018. 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 He went back to the angiography suite on 04/30/2018 he underwent PTA and balloon angioplasty of the occluded left mid anterior tibial artery, thrombotic occlusion went from 100 to 0% which reconstitutes the posterior tibial artery. He had thrombectomy and aspiration of the peroneal artery. The stent placed in the distal SFA left SFA was still occluded. 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 He has been recuperating at [facility] home in [facility] after the most recent hospitalization. He is going home tomorrow. He tells me that 2 weeks ago he traumatized the tip of his left fifth toe. He came in  urgently for our review of this. This was a history of before I noted that [name] had already noted dry gangrenous changes of the left fifth toe 06/09/2018; 2-week follow-up. I did contact [name] after his last appointment and he apparently saw 1 of Dr. Verl Dicker colleagues the next day. He does not follow-up with [name] himself until Thursday of this week. 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 Nevertheless there is no evidence of infection no drainage and no pain. He had a new area that this week when we were signing him in today on the left anterior mid tibia area, this is in close proximity to the previous wound we have dealt with in this clinic. 06/23/2018; 2-week follow-up. I did not receive a recent note from [name] to review today. Our office is trying to obtain this. He is apparently not planning to do further vascular interventions and wondered about compression to try and help with the patient's chronic venous insufficiency. However we are also concerned about the arterial flow. He arrives in clinic today with a new area on the left third toe. The areas on the calf/anterior tibia are close to closing. The left fifth toe is still mummified using Betadine. -In reviewing things with the patient he has what sounds like claudication with mild to moderate amount of activity. 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 I prescribed Levaquin over the phone while we attempted to arrange a plan of care. However the patient called yesterday to report he had low-grade fever and he came in today acutely. There is been a marked deterioration in the left third toe with spreading cellulitis up into the dorsal left foot. He was referred to the emergency room. Readmission: 06/29/2020 patient presents today for reevaluation here in our clinic he was previously treated by Dr. Leanord Hawking at the latter part of 2019 in 2 the beginning of  2020. Subsequently we have not seen him since that time in the interim he did have evaluation with vein and vascular specialist specifically Dr. Bo Mcclintock who did perform quite extensive work for a left femoral to anterior tibial artery bypass. With that being said in the interim the patient has developed significant lymphedema and has wounds that he tells  me have really never healed in regard to the incision site on the left leg. He also has multiple wounds on the feet for various reasons some of which is that he tends to pick at his feet. Fortunately there is no signs of active infection systemically at this time he does have some wounds that are little bit deeper but most are fairly superficial he seems to have good blood flow and overall everything appears to be healthy I see no bone exposed and no obvious signs of osteomyelitis. I do not know that he necessarily needs a x-ray at this point although that something we could consider depending on how things progress. The patient does have a history of lymphedema, diabetes, this is type II, chronic kidney disease stage III, hypertension, and history of peripheral vascular disease. 07/05/2020; patient admitted last week. Is a patient I remember from 2019 he had a spreading infection involving the left foot and we sent him to the hospital. 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 He subsequently had a left femoral to anterior tibial bypass by [facility]. He also has severe lymphedema with chronic skin changes related to that on the left leg. The most problematic area that was new today was on the left medial great toe. This was apparently a small area last week there was purulent drainage which our intake nurse cultured. Also areas on the left medial foot and heel left lateral foot. He has 2 areas on the left medial  calf left lateral calf in the setting of the severe lymphedema. 07/13/2020 on evaluation today patient appears to be doing better in my opinion compared to his last visit. The good news is there is no signs of active infection systemically and locally I do not see any signs of infection either. He did have an x-ray which was negative that is great news he had a culture which showed MRSA but at the same time he is been on the doxycycline which has helped. I do think we may want to extend this for 7 additional days 1/25; patient admitted to the clinic a few weeks ago. 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 We have been putting him under compression his edema control is a lot better but he is severe verricused skin on the left leg. He is really done quite well he still has an open area on the left medial calf and the left medial first metatarsal head. We have been using silver collagen on the leg silver alginate on the foot 07/27/2020 upon evaluation today patient appears to be doing decently well in regard to his wounds. He still has a lot of dry skin on the left leg. Some of this is starting to peel back and I think he may be able to have them out by removing some that today. Fortunately there is no signs of active infection at this time on the left leg although on the right leg he does appear to have swelling and erythema as well as some mild warmth to touch. This does have been concerned about the possibility of cellulitis although within the differential diagnosis I do think that potentially a DVT has to be at least considered. We need to rule that out before proceeding would just call in the cellulitis. Especially since he is having pain in the posterior aspect  of his calf muscle. 2/8; the patient had seen sparingly. He has severe skin changes of chronic lymphedema in the left leg thickened hyperkeratotic verrucous skin. He has an open wound on the  medial part of the left first met head left mid tibia. He also has a rim of nonepithelialized skin in the anterior mid tibia. He brought in the AmLactin lotion that was been prescribed although I am not sure under compression and its utility. There concern about cellulitis on the right lower leg the last time he was here. He was put on on antibiotics. His DVT rule out was negative. The right leg looks fine he is using his stocking on this area 08/10/2020 upon evaluation today patient appears to be doing well with regard to his leg currently. He has been tolerating the dressing changes without complication. Fortunately there is no signs of active infection which is great news. Overall very pleased with where things stand. 2/22; the patient still has an area on the medial part of the left first met his head. This looks better than when I last saw this earlier this month he has a rim of epithelialization but still some surface debris. Mostly everything on the left leg is healed. There is still a vulnerable in the left mid tibia area. 08/30/2020 upon evaluation today patient appears to be doing much better in regard to his wounds on his foot. 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 Nonetheless I think we will address that today I Minna send in a prescription for him in that regard. Overall though there does not appear to be any signs of significant worsening. 09/07/2020 on evaluation today patient's wounds over his left foot appear to be doing excellent. I do not see any signs of infection there is some callus buildup this can require debridement for certain but overall I feel like he is managing quite nicely. He still using the AmLactin cream which has been beneficial for him as well. 3/22; left foot wound is closed. There is no open area here. He is using ammonium  lactate lotion to the lower extremities to help exfoliate dry cracked skin. He has compression stockings from [facility] in [place]. The wound on the medial part of his left first met head is healed today. READMISSION 04/12/2021 Mr. Mcclory is a patient we know fairly well he had a prolonged stay in clinic in 2019 with wounds on his left lateral and left anterior lower extremity in the setting of chronic venous insufficiency. More recently he was here earlier this year with predominantly an area on his left foot first metatarsal head plantar and he says the plantar foot broke down on its not long after we discharged him but he did not come back here. The last few months areas of broken down on his left anterior and again the left lateral lower extremity. The leg itself is very swollen chronically enlarged a lot of hyperkeratotic dry Berry Q skin in the left lower leg. His edema extends well into the thigh. He was seen by Dr. Randie Heinz. He had ABIs on 03/02/2021 showing an ABI on the right of 1 with a TBI of 0.72 his ABI in the left at 1.09 TBI of 0.99. Monophasic and biphasic waveforms on the right. On the left monophasic waveforms were noted he went on to have an angiogram on 03/27/2021  this showed the aortic aortic and iliac segments were free of flow-limiting stenosis the left common femoral vein to evaluate the left femoral to anterior tibial artery bypass was unobstructed the bypass was patent without any areas of stenosis. We discharged the patient in bilateral juxta lite stockings but very clearly that was not sufficient to control the swelling and maintain skin integrity. He is clearly going to need compression pumps. 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 This is fortunate because he is on his feet for long periods of time. 10/27; patient comes in with our intake nurse reporting copious amount of green drainage from the left anterior mid  tibia the left dorsal foot and to a lesser extent the left medial mid tibia. We left the compression wrap on all week for the amount of edema in his left leg is quite a bit better. We use silver alginate as the primary dressing 11/3; edema control is good. Left anterior lower leg left medial lower leg and the plantar first metatarsal head. The left anterior lower leg required debridement. Deep tissue culture I did of this wound showed MRSA I put him on 10 days of doxycycline which she will start today. We have him in compression wraps. He has a security card and [employer] however he is retiring on November 15. We will need to then get him into a better offloading boot for the left foot perhaps a total contact cast 11/10; edema control is quite good. Left anterior and left medial lower leg wounds in the setting of chronic venous insufficiency and lymphedema. He also has a substantial area over the left plantar first metatarsal head. 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 He has significant hypergranulation on the left plantar foot wound. The patient is a diabetic but he does not have significant PAD 11/17; edema control is quite good. Left anterior and left medial lower leg wounds look better. The really concerning area remains the area on the left plantar first metatarsal head. He has a rim of epithelialization. 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 Starting today with a forefoot off loader but . possibly a total contact cast. 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 Arrives with a very significant odor from I think all of the wounds on the left leg and the left foot. Because of this  I did not put a total contact cast on him today but will could still consider this. His wife was having cataract surgery which is the reason he missed the appointment 12/6. I saw this man 5 days ago with a swelling below the popliteal fossa. I thought he actually might have a Baker's cyst however the DVT rule out study that we could arrange right away was negative the technician told me this was not a ruptured Baker's cyst. We attempted to get this aspirated by under ultrasound guidance in interventional radiology however all they did was an ultrasound however it shows an extensive fluid collection 62 x 8 x 9.4 in the left thigh and left calf. The patient  states he thinks this started 8 days ago or so but he really is not complaining of any pain, fever or systemic symptoms. He has not ha 12/20; after some difficulty I managed to get the patient into see Dr. Randie Heinz. Eventually he was taken into the hospital and had a drain put in the fluid collection below his left knee posteriorly extending into the posterior thigh. He still has the drain in place. Culture of this showed moderate staff aureus few Morganella and few Klebsiella he is now on doxycycline and ciprofloxacin as suggested by infectious disease he is on this for a month. The drain will remain in place until it stops draining 12/29; he comes in today with the 1 wound on his left leg and the area on the left plantar first met head significantly smaller. Both look healthy. [name], [name] ([id number]) [id number] Page 14 of 22 He still has the drain in the left leg. He says he has to change this daily. Follows up with Dr. Randie Heinz on January 11. 06/29/2021; the wounds that I am following on the left leg and left first met head continued to be quite healthy. However the area where his inferior drain is in place had copious amounts of drainage which was green in color. The wound here is larger. Follows up with Dr. Pascal Lux of vein and  vascular his surgeon next week as well as infectious disease. He remains on ciprofloxacin and doxycycline. He is not complaining of excessive pain in either one of the drain areas 1/12; the patient saw vascular surgery and infectious disease. Vascular surgery has left the drain in place as there was still some notable drainage still see him back in 2 weeks. Dr. Dorthula Perfect stop the doxycycline and ciprofloxacin and I do not believe he follows up with them at this point. Culture I did last week showed both doxycycline resistant MRSA and Pseudomonas not sensitive to ciprofloxacin although only in rare titers 1/19; the patient's wound on the left anterior lower leg is just about healed. We have continued healing of the area that was medially on the left leg. Left first plantar metatarsal head continues to get smaller. The major problem here is his 2 drain sites 1 on the left upper calf and lateral thigh. There is purulent drainage still from the left lateral thigh. I gave him antibiotics last week but we still have recultured. He has the drain in the area I think this is eventually going to have to come out. I suspect there will be a connecting wound to heal here perhaps with improved VAc 1/26; the patient had his drain removed by vein and vascular on 1/25/. This was a large pocket of fluid in his left thigh that seem to tunnel into his left upper calf. 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 His mention his Penrose drain was removed today. He now has a tunneling wound on his left calf and left thigh. Both of these probe widely towards each other although I cannot really prove that they connect. Both wounds on his lower leg anteriorly are closed and his area over the first metatarsal head on his right foot continues to improve. We are using Hydrofera Blue here. He also saw infectious disease culture of the abscess they noted was polymicrobial with MRSA, Morganella and Klebsiella he was  treated with doxycycline and ciprofloxacin for 4 weeks ending on 07/03/2021. They did not recommend any further antibiotics. Notable that while he still had the  Penrose drain in place last week he had purulent drainage coming out of the inferior IandD site this grew [facility], MRSA and Pseudomonas but there does not appear to be any active infection in this area today with the drain out and he is not systemically unwell 2/2; with regards to the drain sites the superior one on the thigh actually is closed down the one on the upper left lateral calf measures about 8 and half centimeters which is an improvement seems to be less prominent although still with a lot of drainage. The only remaining wound is over the first metatarsal head on the left foot and this looks to be continuing to improve with Hydrofera Blue. 2/9; the area on his plantar left foot continues to contract. Callus around the wound edge. The drain sites specifically have not come down in depth. We put the wound VAC on Monday he changed the canister late last night our intake nurse reported a pocket of fluid perhaps caused by our compression wraps 2/16; continued improvement in left foot plantar wound. drainage site in the calf is not improved in terms of depth (wound vac) 2/23; continued improvement in the left foot wound over the first metatarsal head. With regards to the drain sites the area on his thigh laterally is healed however the open area on his calf is small in terms of circumference by still probes in by about 15 cm. Within using the wound VAC. Hydrofera Blue on his foot 08/24/2021: The left first metatarsal head wound continues to improve. The wound bed is healthy with just some surrounding callus. Unfortunately the open drain site on his calf remains open and tunnels at least 15 cm (the extent of a Q-tip). This is despite several weeks of wound VAC treatment. Based on reading back through the notes, there has been really no  significant change in the depth of the wound, although the orifice is smaller and the more cranial wound on his thigh has closed. I suspect the tunnel tracks nearly all the way to this location. 08/31/2021: Continued improvement in the left first metatarsal head wound. There has been absolutely no improvement to the long tunnel from his open drain site on his calf. We have tried to get him into see vascular surgery sooner to consider the possibility of simply filleting the tract open and allowing it to heal from the bottom up, likely with a wound VAC. They have not yet scheduled a sooner appointment than his current mid [name] 09/14/2021: He was seen by vascular surgery and they took him to the operating room last week. They opened a portion of the tunnel, but did not extend the entire length of the known open subcutaneous tract. I read Dr. Darcella Cheshire operative note and it is not clear from that documentation why only a portion of the tract was opened. The heaped up granulation tissue was curetted and removed from at least some portion of the tract. They did place a wound VAC and applied an Unna boot to the leg. The ulcer on his left first metatarsal head is smaller today. The bed looks good and there is just a small amount of surrounding callus. 09/21/2021: The ulcer on his left first metatarsal head looks to be stalled. There is some callus surrounding the wound but the wound bed itself does not appear particularly dynamic. The tunnel tract on his lateral left leg seems to be roughly the same length or perhaps slightly smaller but the wound bed appears healthy with  good granulation tissue. He opened up a new wound on his medial thigh and the site of a prior surgical incision. He says that he did this unconsciously in his sleep by scratching. 09/28/2021: Unfortunately, the ulcer on his left first metatarsal head has extended underneath the callus toward the dorsum of his foot. The medial thigh wounds are roughly  the same. The tunnel on his lateral left leg continues to be problematic; it is longer than we are able to actually probe with a Q-tip. I am still not certain as to why Dr. Randie Heinz did not open this up entirely when he took the patient to the operating room. We will likely be back in the same situation with just a small superficial opening in a long unhealed tract, as the open portion is granulating in nicely. 10/02/2021: The patient was initially scheduled for a nurse visit, but we are also applying a total contact cast today. The plantar foot wound looks clean without significant accumulated callus. We have been applying Prisma silver collagen to the site. 10/05/2021: The patient is here for his first total contact cast change. We have tried using gauze packing strips in the tunnel on his lateral leg wound, but this does not seem to be working any better than the white VAC foam. The foot ulcer looks about the same with minimal periwound callus. Medial thigh wound is clean with just some overlying eschar. 10/12/2021: The plantar foot wound is stable without any significant accumulation of periwound callus. The surface is viable with good granulation tissue. The medial thigh wounds are much smaller and are epithelializing. On the other hand, he had purulent drainage coming from the tunnel on his lateral leg. He does go back to see Dr. Randie Heinz next week and is planning to ask him why the wound tunnel was not completely opened at the time of his most recent operation. 10/19/2021: The plantar foot wound is markedly improved and has epithelial tissue coming through the surface. The medial thigh wounds are nearly closed with just a tiny open area. He did see Dr. Randie Heinz earlier this week and apparently they did discuss the possibility of opening the sinus tract further and enabling a wound VAC application. Apparently there are some limits as to what Dr. Randie Heinz feels comfortable opening, presumably in relationship to his  bypass graft. I think if we could get the tract open to the level of the popliteal fossa, this would greatly aid in her ability to get this chart closed. That being said, however, today when I probed the tract with a Q-tip, I was not able to insert the entirety of the Q-tip as I have on previous occasions. The tunnel is shorter by about 4 cm. The surface is clean with good granulation tissue and no further episodes of purulent drainage. 10/30/2021: Last week, the patient underwent surgery and had the long tract in his leg opened. There was a rind that was debrided, according to the operative report. His medial thigh ulcers are closed. The plantar foot wound is clean with a good surface and some built up surrounding callus. 11/06/2021: The overall dimensions of the large wound on his lateral leg remain about the same, but there is good granulation tissue present and the tunneling is a little bit shorter. He has a new wound on his anterior tibial surface, in the same location where he had a similar lesion in the past. The plantar foot wound is clean with some buildup surrounding callus. Just toward the medial  aspect of his foot, however, there is an area of darkening that once debrided, revealed another opening in the skin surface. 11/13/2021: The anterior tibial surface wound is closed. The plantar foot wound has some surrounding callus buildup. The area of darkening that I debrided last [name], [name] ([id number]) [id number] Page 15 of 22 week and revealed an opening in the skin surface has closed again. The tunnel in the large wound on his lateral leg has come in by about 3 cm. There is healthy granulation tissue on the entire wound surface. 11/23/2021: The patient was out of town last week and did wet-to-dry dressings on his large wound. 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 Unfortunately, he picked open  the wound on his left medial thigh. He says that it was itching and he just could not stop scratching it until it was open again. The wound on his plantar foot is smaller and has not accumulated a tremendous amount of callus. The lateral leg wound is shallower and the tunnel has also decreased in depth. There is just a little bit of slough accumulation on the surface. 11/30/2021: Another portion of his left medial thigh has been opened up. All of these wounds are fairly superficial with just a little bit of slough and eschar accumulation. The wound on his plantar foot is almost closed with just a bit of eschar and periwound callus accumulation. The lateral leg wound is nearly flush with the surrounding skin and the tunnel is markedly shallower. 12/07/2021: There is just 1 open area on his left medial thigh. It is clean with just a little bit of perimeter eschar. The wound on his plantar foot continues to contract and just has some eschar and periwound callus accumulation. The lateral leg wound is closing at the more distal aspect and the tunnel is smaller. The surface is nearly flush with the surrounding skin and it has a good bed of granulation tissue. 12/14/2021: The thigh and foot wounds are closed. The lateral leg wound has closed over approximately half of its length. The tunnel continues to contract and the surface is now flush with the surrounding skin. The wound bed has robust granulation tissue. 12/22/2021: The thigh and foot wounds have reopened. The foot wound has a lot of callus accumulation around and over it. The thigh wound is tiny with just a little bit of slough in the wound bed. The lateral leg wound continues to contract. His vascular surgeon took the wound VAC off earlier in the week and the patient has been doing wet-to-dry dressings. There is a little slough accumulation on the surface. The tunnel is about 3 cm in depth at this point. 12/28/2021: The thigh wound is closed again. The foot  wound has some callus that subsequently has peeled back exposing just a small slit of a wound. The lateral leg wound Is down to about half the size that it originally was and the tunnel is down to about half a centimeter in depth. 01/04/2022: The thigh wound remains closed. The foot wound has heavy callus overlying the wound site. Once this was debrided, the wound was found to be closed. The lateral leg wound is smaller again this week and very superficial. No tunnel could be identified. 01/12/2022: The thigh and foot wounds both remain closed. The lateral leg wound is now nearly flush with the skin surface. There is good granulation tissue present with a light layer of  slough. 01/19/2022: Due to the way his wrap was placed, the patient did not change the dressing on his thigh at all and so the foam was saturated and his skin is macerated. There is a light layer of slough on the wound surface. The underlying granulation tissue is robust and healthy-appearing. He has heavy callus buildup at the site of his first metatarsal head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

## 2023-02-28 ENCOUNTER — Encounter (HOSPITAL_BASED_OUTPATIENT_CLINIC_OR_DEPARTMENT_OTHER): Payer: Medicare Other | Attending: General Surgery | Admitting: General Surgery

## 2023-02-28 DIAGNOSIS — E1151 Type 2 diabetes mellitus with diabetic peripheral angiopathy without gangrene: Secondary | ICD-10-CM | POA: Diagnosis present

## 2023-02-28 DIAGNOSIS — I87322 Chronic venous hypertension (idiopathic) with inflammation of left lower extremity: Secondary | ICD-10-CM | POA: Diagnosis not present

## 2023-02-28 DIAGNOSIS — E11622 Type 2 diabetes mellitus with other skin ulcer: Secondary | ICD-10-CM | POA: Insufficient documentation

## 2023-02-28 DIAGNOSIS — L97528 Non-pressure chronic ulcer of other part of left foot with other specified severity: Secondary | ICD-10-CM | POA: Insufficient documentation

## 2023-02-28 DIAGNOSIS — L859 Epidermal thickening, unspecified: Secondary | ICD-10-CM | POA: Diagnosis not present

## 2023-02-28 NOTE — Progress Notes (Signed)
COLTIN, HASLEM ([id number]) [id number] Page 1 of 5 Visit Report for 02/28/2023 Arrival Information Details Patient Name: Date of Service: [name], [name] 02/28/2023 3:30 PM Medical Record Number: [id number] Patient Account Number: [id number] Date of Birth/Sex: Treating RN: Jan 01, 1951 (72 y.o. [name] Primary Care Tristy Udovich: [name] Other Clinician: Referring Jasten Guyette: Treating Mylisa Brunson/Extender: [name] in Treatment: 31 Visit Information History Since Last Visit Added or deleted any medications: No Patient Arrived: Ambulatory Any new allergies or adverse reactions: No Arrival Time: 16:00 Had a fall or experienced change in No Accompanied By: self activities of daily living that may affect Transfer Assistance: None risk of falls: Patient Identification Verified: Yes Signs or symptoms of abuse/neglect since last visito No Secondary Verification Process Completed: Yes Hospitalized since last visit: No Patient Requires Transmission-Based Precautions: No Implantable device outside of the clinic excluding No Patient Has Alerts: Yes cellular tissue based products placed in the center since last visit: Has Dressing in Place as Prescribed: Yes Has Compression in Place as Prescribed: Yes Pain Present Now: No Electronic Signature(s) Signed: 02/28/2023 4:53:04 PM By: Zenaida Deed RN, BSN Entered By: Zenaida Deed on 02/28/2023 13:39:01 -------------------------------------------------------------------------------- Compression Therapy Details Patient Name: Date of Service: [name]. 02/28/2023 3:30 PM Medical Record Number: [id number] Patient Account Number: [id number] Date of Birth/Sex: Treating RN: 03/29/1951 (71 y.o. [name] Primary Care Telissa Palmisano: [name] Other Clinician: Referring Arvilla Salada: Treating Raiden Haydu/Extender: [name] in Treatment: 08 Compression Therapy  Performed for Wound Assessment: Wound #18R Left,Plantar Metatarsal head first Performed By: Clinician Zenaida Deed, RN Compression Type: Double Layer Notes Stephannie Li Electronic Signature(s) Signed: 02/28/2023 4:53:04 PM By: Zenaida Deed RN, BSN Entered By: Zenaida Deed on 02/28/2023 13:40:04 [name] ([id number]) [id number] Page 2 of 5 -------------------------------------------------------------------------------- Encounter Discharge Information Details Patient Name: Date of Service: [name] 02/28/2023 3:30 PM Medical Record Number: [id number] Patient Account Number: [id number] Date of Birth/Sex: Treating RN: September 10, 1950 (72 y.o. [name] Primary Care Ladaja Yusupov: [name] Other Clinician: Referring Tracee Mccreery: Treating Pheng Prokop/Extender: [name] in Treatment: 80 Encounter Discharge Information Items Discharge Condition: Stable Ambulatory Status: Ambulatory Discharge Destination: Home Transportation: Private Auto Accompanied By: self Schedule Follow-up Appointment: Yes Clinical Summary of Care: Patient Declined Electronic Signature(s) Signed: 02/28/2023 4:53:04 PM By: Zenaida Deed RN, BSN Entered By: Zenaida Deed on 02/28/2023 13:41:14 -------------------------------------------------------------------------------- Patient/Caregiver Education Details Patient Name: Date of Service: [name] 9/5/2024andnbsp3:30 PM Medical Record Number: [id number] Patient Account Number: [id number] Date of Birth/Gender: Treating RN: 05/14/1951 (71 y.o. [name] Primary Care Physician: [name] Other Clinician: Referring Physician: Treating Physician/Extender: [name] in Treatment: 68 Education Assessment Education Provided To: Patient Education Topics Provided Venous: Methods: Explain/Verbal Responses: Reinforcements needed, State content  correctly Wound/Skin Impairment: Methods: Explain/Verbal Responses: Reinforcements needed, State content correctly Electronic Signature(s) Signed: 02/28/2023 4:53:04 PM By: Zenaida Deed RN, BSN Entered By: Zenaida Deed on 02/28/2023 13:41:00 -------------------------------------------------------------------------------- Wound Assessment Details Patient Name: Date of Service: [name]. 02/28/2023 3:30 PM Medical Record Number: [id number] Patient Account Number: [id number] Date of Birth/Sex: Treating RN: 03/07/1951 (71 y.o. [name] Primary Care Ruta Capece: [name] Other Clinician: [name] ([id number]) [id number] Page 3 of 5 Referring Tranell Wojtkiewicz: Treating Faustina Gebert/Extender: [name] in Treatment: 98 Wound Status Wound Number: 18R Primary Etiology: Diabetic Wound/Ulcer of the Lower Extremity Wound Location: Left, Plantar Metatarsal head first Wound Status: Open Wounding Event: Gradually  Appeared Date Acquired: 08/23/2020 Weeks Of Treatment: 98 Clustered Wound: No Wound Measurements Length: (cm) 1.7 Width: (cm) 2 Depth: (cm) 0.1 Area: (cm) 2.67 Volume: (cm) 0.267 % Reduction in Area: 81.1% % Reduction in Volume: 90.6% Wound Description Classification: Grade 2 Exudate Amount: Medium Exudate Type: Serosanguineous Exudate Color: red, brown Periwound Skin Texture Texture Color No Abnormalities Noted: No No Abnormalities Noted: No Moisture No Abnormalities Noted: No Treatment Notes Wound #18R (Metatarsal head first) Wound Laterality: Plantar, Left Cleanser Soap and Water Discharge Instruction: May shower and wash wound with dial antibacterial soap and water prior to dressing change. Wound Cleanser Discharge Instruction: Cleanse the wound with wound cleanser prior to applying a clean dressing using gauze sponges, not tissue or cotton balls. Peri-Wound Care Ketoconazole Cream 2% Discharge  Instruction: Apply Ketoconazole as directed Triamcinolone 15 (g) Discharge Instruction: Use triamcinolone 15 (g) as directed Topical Gentamicin Discharge Instruction: As directed by physician Mupirocin Ointment Discharge Instruction: Apply Mupirocin (Bactroban) as instructed Primary Dressing Maxorb Extra Ag+ Alginate Dressing, 2x2 (in/in) Discharge Instruction: Apply to wound bed as instructed Secondary Dressing Optifoam Non-Adhesive Dressing, 4x4 in Discharge Instruction: Apply over primary dressing as directed. Woven Gauze Sponges 2x2 in Discharge Instruction: Apply over primary dressing as directed. Zetuvit Absorbent Pad, 4x4 (in/in) Secured With Compression Wrap Urgo K2, (equivalent to a 4 layer) two layer compression system, regular Discharge Instruction: Apply Urgo K2 as directed (alternative to 4 layer compression). Compression Stockings AUTHUR, LAUGHEAD ([id number]) [id number] Page 4 of 5 Add-Ons Electronic Signature(s) Signed: 02/28/2023 4:53:04 PM By: Zenaida Deed RN, BSN Entered By: Zenaida Deed on 02/28/2023 13:39:27 -------------------------------------------------------------------------------- Wound Assessment Details Patient Name: Date of Service: [name]. 02/28/2023 3:30 PM Medical Record Number: [id number] Patient Account Number: [id number] Date of Birth/Sex: Treating RN: 08-31-1950 (72 y.o. [name] Primary Care Benard Minturn: [name] Other Clinician: Referring Medha Pippen: Treating Venie Montesinos/Extender: [name] in Treatment: 98 Wound Status Wound Number: 22R Primary Etiology: Cyst Wound Location: Left, Proximal, Lateral Lower Leg Wound Status: Open Wounding Event: Bump Date Acquired: 06/03/2021 Weeks Of Treatment: 90 Clustered Wound: Yes Wound Measurements Length: (cm) Width: (cm) Depth: (cm) Area: (cm) Volume: (cm) 0 % Reduction in Area: 100% 0 % Reduction in Volume:  100% 0 0 0 Wound Description Classification: Full Thickness With Exposed Support S Exudate Amount: Small Exudate Type: Serous Exudate Color: amber tructures Periwound Skin Texture Texture Color No Abnormalities Noted: No No Abnormalities Noted: No Moisture No Abnormalities Noted: No Treatment Notes Wound #22R (Lower Leg) Wound Laterality: Left, Lateral, Proximal Cleanser Soap and Water Discharge Instruction: May shower and wash wound with dial antibacterial soap and water prior to dressing change. Wound Cleanser Discharge Instruction: Cleanse the wound with wound cleanser prior to applying a clean dressing using gauze sponges, not tissue or cotton balls. Peri-Wound Care Topical Primary Dressing Endoform 2x2 in Discharge Instruction: Moisten with saline Secondary Dressing Woven Gauze Sponge, Non-Sterile 4x4 in Discharge Instruction: Apply over primary dressing as directed. MAEL, MILCH ([id number]) [id number] Page 5 of 5 Secured With Elastic Bandage 4 inch (ACE bandage) Discharge Instruction: Secure with ACE bandage as directed. Kerlix Roll Sterile, 4.5x3.1 (in/yd) Discharge Instruction: Secure with Kerlix as directed. Compression Wrap Compression Stockings Add-Ons Electronic Signature(s) Signed: 02/28/2023 4:53:04 PM By: Zenaida Deed RN, BSN Entered By: Zenaida Deed on 02/28/2023 13:39:27

## 2023-03-01 NOTE — Progress Notes (Signed)
CALLAHAN, MONDRY ([id number]) [id number] Page 1 of 1 Visit Report for 02/28/2023 SuperBill Details Patient Name: Date of Service: [name], [name] 02/28/2023 Medical Record Number: [id number] Patient Account Number: [id number] Date of Birth/Sex: Treating RN: [birth year] (72 y.o. 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

## 2023-03-07 ENCOUNTER — Encounter (HOSPITAL_BASED_OUTPATIENT_CLINIC_OR_DEPARTMENT_OTHER): Payer: Medicare Other | Admitting: General Surgery

## 2023-03-07 DIAGNOSIS — E11622 Type 2 diabetes mellitus with other skin ulcer: Secondary | ICD-10-CM | POA: Diagnosis not present

## 2023-03-07 NOTE — Progress Notes (Signed)
DELTA, ISING ([id number]) [id number] Page 1 of 21 Visit Report for 03/07/2023 Chief Complaint Document Details Patient Name: Date of Service: [name], [name] 03/07/2023 10:00 A M Medical Record Number: [id number] Patient Account Number: [id number] Date of Birth/Sex: Treating RN: [birth year] (72 y.o. M) Primary Care Provider: [name] Other Clinician: Referring Provider: Treating Provider/Extender: [name] in Treatment: 99 Information Obtained from: Patient Chief Complaint Left leg and foot ulcers 04/12/2021; patient is here for wounds on his left lower leg and left plantar foot over the first metatarsal head Electronic Signature(s) Signed: 03/07/2023 10:45:37 AM By: [name] MD FACS Entered By: [name] on 03/07/2023 10:45:36 -------------------------------------------------------------------------------- Debridement Details Patient Name: Date of Service: [name] Grapes. 03/07/2023 10:00 A M Medical Record Number: [id number] Patient Account Number: [id number] Date of Birth/Sex: Treating RN: 05/27/1951 (72 y.o. [name] Primary Care Provider: [name] Other Clinician: Referring Provider: Treating Provider/Extender: [name] in Treatment: 99 Debridement Performed for Assessment: Wound #18R Left,Plantar Metatarsal head first Performed By: Physician [name], MD The following information was scribed by: [name] The information was scribed for: [name] Debridement Type: Debridement Severity of Tissue Pre Debridement: Fat layer exposed Level of Consciousness (Pre-procedure): Awake and Alert Pre-procedure Verification/Time Out Yes - 10:22 Taken: Start Time: 10:22 Pain Control: Lidocaine 4% Topical Solution Percent of Wound Bed Debrided: 100% T Area Debrided (cm): otal 2.04 Tissue and other material debrided: Non-Viable, Callus, Slough,  Skin: Epidermis, Slough Level: Skin/Epidermis Debridement Description: Selective/Open Wound Instrument: Curette Bleeding: Minimum Hemostasis Achieved: Pressure Response to Treatment: Procedure was tolerated well Level of Consciousness (Post- Awake and Alert procedure): Post Debridement Measurements of Total Wound Length: (cm) 1.3 Width: (cm) 2 Depth: (cm) 0.2 Volume: (cm) 0.408 Character of Wound/Ulcer Post Debridement: Improved [name] ([id number]) [id number] Page 2 of 21 Severity of Tissue Post Debridement: Fat layer exposed Post Procedure Diagnosis Same as Pre-procedure Electronic Signature(s) Signed: 03/07/2023 12:00:58 PM By: [name] MD FACS Signed: 03/07/2023 3:42:42 PM By: [name] Entered By: [name] on 03/07/2023 10:27:57 -------------------------------------------------------------------------------- HPI Details Patient Name: Date of Service: [name] Grapes. 03/07/2023 10:00 A M Medical Record Number: [id number] Patient Account Number: [id number] Date of Birth/Sex: Treating RN: 10/28/1950 (72 y.o. M) Primary Care Provider: [name] Other Clinician: Referring Provider: Treating Provider/Extender: [name] in Treatment: 60 History of Present Illness HPI Description: 10/11/17; Mr. Christin is a 72 year old man who tells me that in 2015 he slipped down the latter traumatizing his left leg. He developed a wound in the same spot the area that we are currently looking at. He states this closed over for the most part although he always felt it was somewhat unstable. In 2016 he hit the same area with the door of his car had this reopened. He tells me that this is never really closed although sometimes an inflow it remains open on a constant basis. He has not been using any specific dressing to this except for topical antibiotics the nature of which were not really sure. His primary doctor  did send him to see Dr. Jacinto Halim of interventional cardiology. He underwent an angiogram on 08/06/17 and he underwent a PTA and directional atherectomy of the lesser distal SFA and popliteal arteries which resulted in brisk improvement in blood flow. It was noted that he had 2 vessel runoff through the anterior tibial and peroneal. He is also been to see vascular and interventional radiologist. He was  not felt to have any significant superficial venous insufficiency. Presumably is not a candidate for any ablation. It was suggested he come here for wound care. The patient is a type II diabetic on insulin. He also has a history of venous insufficiency. ABIs on the left were noncompressible in our clinic 10/21/17; patient we admitted to the clinic last week. 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 We put Iodosorb on him after an aggressive debridement and 3 layer compression. He complained of pain in his ankle and itching with is skin in fact he scratched the area on the medial calf superiorly at the rim of our wraps and he has 2 small open areas in that location today which are new. I changed his primary dressing today to silver collagen. As noted he is already had revascularization and does not have any significant superficial venous insufficiency that would be amenable to ablation 10/28/17; patient admitted to the clinic 2 weeks ago. He has a smaller Wound. Scratch injury from last week revealed. There is large wound over the tibial area. This is smaller. Granulation looks healthy. No need for debridement. 11/04/17; the wound on the left lateral calf looks better. Improved dimensions. Surface of this looks better. We've been maintaining him and Kerlix Coban wraps. He finds this much more comfortable. Silver collagen dressing 11/11/17; left lateral Wound continues to look healthy be making progress. Using a #5 curet I removed removed nonviable  skin from the surface of the wound and then necrotic debris from the wound surface. Surface of the wound continues to look healthy. He also has an open area on the left great toenail bed. We've been using topical antibiotics. 11/19/17; left anterior lateral wound continues to look healthy but it's not closed. He also had a small wound above this on the left leg Initially traumatic wounds in the setting of significant chronic venous insufficiency and stasis dermatitis 11/25/17; left anterior wounds superiorly is closed still a small wound inferiorly. 12/02/17; left anterior tibial area. Arrives today with adherent callus. Post debridement clearly not completely closed. Hydrofera Blue under 3 layer compression. 12/09/17; left anterior tibia. Circumferential eschar however the wound bed looks stable to improved. We've been using Hydrofera Blue under 3 layer compression 12/17/17; left anterior tibia. Apparently this was felt to be closed however when the wrap was taken off there is a skin tear to reopen wounds in the same area we've been using Hydrofera Blue under 3 layer compression 12/23/17 left anterior tibia. Not close to close this week apparently the [facility] was stuck to this again. Still circumferential eschar requiring debridement. I put a contact layer on this this time under the Hydrofera Blue 12/31/17; left anterior tibia. Wound is better slight amount of hyper-granulation. Using Hydrofera Blue over Adaptic. 01/07/18; left anterior tibia. The wound had some surface eschar however after this was removed he has no open wound.he was already revascularized by Dr. Jacinto Halim when he came to our clinic with atherectomy of the left SFA and popliteal artery. He was also sent to interventional radiology for venous reflux studies. He was not felt to have significant reflux but certainly has chronic venous changes of his skin with hemosiderin deposition around this area. He will definitely need to lubricate his  skin and wear compression stocking and I've talked to him about this. READMISSION 05/26/2018 This is a now 72 year old man we cared for with traumatic wounds on his left anterior  lower extremity. He had been previously revascularized during that admission by Dr. Jacinto Halim. Apparently in follow-up Dr. Jacinto Halim noted that he had deterioration in his arterial status. He underwent a stent placement in the distal left SFA on 04/22/2018. 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 He went back to the angiography suite on 04/30/2018 he underwent PTA and balloon angioplasty of the occluded left mid anterior tibial artery, thrombotic occlusion went from 100 to 0% which reconstitutes the posterior tibial artery. He had thrombectomy and aspiration of the peroneal artery. The stent placed in the distal SFA left SFA was still occluded. 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 [name], [name] ([id number]) [id number] Page 3 of 21 He has been recuperating at [facility] home in [facility] after the most recent hospitalization. He is going home tomorrow. He tells me that 2 weeks ago he traumatized the tip of his left fifth toe. He came in urgently for our review of this. This was a history of before I noted that Dr. Jacinto Halim had already noted dry gangrenous changes of the left fifth toe 06/09/2018; 2-week follow-up. I did contact Dr. Jacinto Halim after his last appointment and he apparently saw 1 of Dr. Verl Dicker colleagues the next day. He does not follow-up with Dr. Jacinto Halim himself until Thursday of this week. 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 Nevertheless there is no evidence of infection no drainage and no pain. He  had a new area that this week when we were signing him in today on the left anterior mid tibia area, this is in close proximity to the previous wound we have dealt with in this clinic. 06/23/2018; 2-week follow-up. I did not receive a recent note from Dr. Jacinto Halim to review today. Our office is trying to obtain this. He is apparently not planning to do further vascular interventions and wondered about compression to try and help with the patient's chronic venous insufficiency. However we are also concerned about the arterial flow. He arrives in clinic today with a new area on the left third toe. The areas on the calf/anterior tibia are close to closing. The left fifth toe is still mummified using Betadine. -In reviewing things with the patient he has what sounds like claudication with mild to moderate amount of activity. 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 I prescribed Levaquin over the phone while we attempted to arrange a plan of care. However the patient called yesterday to report he had low-grade fever and he came in today acutely. There is been a marked deterioration in the left third toe with spreading cellulitis up into the dorsal left foot. He was referred to the emergency room. Readmission: 06/29/2020 patient presents today for reevaluation here in our clinic he was previously treated by Dr. Leanord Hawking at the latter part of 2019 in 2 the beginning of 2020. Subsequently we have not seen him since that time in the interim he did have evaluation with vein and vascular specialist specifically Dr. Bo Mcclintock who did perform quite extensive work for a left femoral to anterior tibial artery bypass. With that being said in the interim the patient has developed significant lymphedema and has wounds that  he tells me have really never healed in regard to the incision site on the left leg. He also has multiple wounds on the feet for various reasons some of which is that  he tends to pick at his feet. Fortunately there is no signs of active infection systemically at this time he does have some wounds that are little bit deeper but most are fairly superficial he seems to have good blood flow and overall everything appears to be healthy I see no bone exposed and no obvious signs of osteomyelitis. I do not know that he necessarily needs a x-ray at this point although that something we could consider depending on how things progress. The patient does have a history of lymphedema, diabetes, this is type II, chronic kidney disease stage III, hypertension, and history of peripheral vascular disease. 07/05/2020; patient admitted last week. Is a patient I remember from 2019 he had a spreading infection involving the left foot and we sent him to the hospital. 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 He subsequently had a left femoral to anterior tibial bypass by Dr.Cain vein and vascular. He also has severe lymphedema with chronic skin changes related to that on the left leg. The most problematic area that was new today was on the left medial great toe. This was apparently a small area last week there was purulent drainage which our intake nurse cultured. Also areas on the left medial foot and heel left lateral foot. He has 2 areas on the left medial calf left lateral calf in the setting of the severe lymphedema. 07/13/2020 on evaluation today patient appears to be doing better in my opinion compared to his last visit. The good news is there is no signs of active infection systemically and locally I do not see any signs of infection either. He did have an x-ray which was negative that is great news he had a culture which showed MRSA but at the same time he is been on the doxycycline which has helped. I do think we may want to extend this for 7 additional days 1/25; patient admitted to the clinic a few weeks ago. 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 We have been putting him under compression his edema control is a lot better but he is severe verricused skin on the left leg. He is really done quite well he still has an open area on the left medial calf and the left medial first metatarsal head. We have been using silver collagen on the leg silver alginate on the foot 07/27/2020 upon evaluation today patient appears to be doing decently well in regard to his wounds. He still has a lot of dry skin on the left leg. Some of this is starting to peel back and I think he may be able to have them out by removing some that today. Fortunately there is no signs of active infection at this time on the left leg although on the right leg he does appear to have swelling and erythema as well as some mild warmth to touch. This does have been concerned about the possibility of cellulitis although within the differential diagnosis I do think that potentially a DVT has to be at least considered. We need to rule that out before proceeding would just call in the cellulitis. Especially since he is having pain in the posterior aspect of his calf muscle. 2/8; the  patient had seen sparingly. He has severe skin changes of chronic lymphedema in the left leg thickened hyperkeratotic verrucous skin. He has an open wound on the medial part of the left first met head left mid tibia. He also has a rim of nonepithelialized skin in the anterior mid tibia. He brought in the AmLactin lotion that was been prescribed although I am not sure under compression and its utility. There concern about cellulitis on the right lower leg the last time he was here. He was put on on antibiotics. His DVT rule out was negative. The right leg looks fine he is using his stocking on this area 08/10/2020 upon evaluation today patient appears to be doing well with regard to his leg currently. He has been tolerating the dressing changes  without complication. Fortunately there is no signs of active infection which is great news. Overall very pleased with where things stand. 2/22; the patient still has an area on the medial part of the left first met his head. This looks better than when I last saw this earlier this month he has a rim of epithelialization but still some surface debris. Mostly everything on the left leg is healed. There is still a vulnerable in the left mid tibia area. 08/30/2020 upon evaluation today patient appears to be doing much better in regard to his wounds on his foot. 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 Nonetheless I think we will address that today I Minna send in a prescription for him in that regard. Overall though there does not appear to be any signs of significant worsening. 09/07/2020 on evaluation today patient's wounds over his left foot appear to be doing excellent. I do not see any signs of infection there is some callus buildup this can require debridement for certain but overall I feel like he is managing quite nicely. He still using the AmLactin cream which has been beneficial for him as well. 3/22; left foot wound is closed. There is no open area here. He is using ammonium lactate lotion to the lower extremities to help exfoliate dry cracked skin. He has compression stockings from elastic therapy in [place]. The wound on the medial part of his left first met head is healed today. READMISSION 04/12/2021 Mr. Dunivan is a patient we know fairly well he had a prolonged stay in clinic in 2019 with wounds on his left lateral and left anterior lower extremity in the setting of chronic venous insufficiency. More recently he was here earlier this year with predominantly an area on his left foot first metatarsal head plantar and he says the plantar foot broke down on  its not long after we discharged him but he did not come back here. The last few months areas of broken down on his left anterior and again the left lateral lower extremity. The leg itself is very swollen chronically enlarged a lot of hyperkeratotic dry Berry Q skin in the left lower leg. His edema extends well into the thigh. He was seen by Dr. Randie Heinz. He had ABIs on 03/02/2021 showing an ABI on the right of 1 with a TBI of 0.72 his ABI in the left at 1.09 TBI of 0.99. Monophasic and biphasic waveforms on the right. On the left monophasic waveforms were noted he went on to have an angiogram on 03/27/2021 this showed the aortic aortic and  iliac segments were free of flow-limiting stenosis the left common femoral vein to evaluate the left femoral to anterior tibial artery bypass was unobstructed the bypass was patent without any areas of stenosis. We discharged the patient in bilateral juxta lite stockings but very clearly that was not sufficient to control the swelling and maintain skin integrity. He is clearly going to need compression pumps. 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 This is fortunate because he is on his feet for long periods of time. 10/27; patient comes in with our intake nurse reporting copious amount of green drainage from the left anterior mid tibia the left dorsal foot and to a lesser extent the left medial mid tibia. We left the compression wrap on all week for the amount of edema in his left leg is quite a bit better. We use silver alginate as DOREEN, BALCERZAK ([id number]) [id number] Page 4 of 21 the primary dressing 11/3; edema control is good. Left anterior lower leg left medial lower leg and the plantar first metatarsal head. The left anterior lower leg required debridement. Deep tissue culture I did of this wound showed MRSA I put him on 10 days of doxycycline which she will start today. We have  him in compression wraps. He has a security card and [employer] however he is retiring on November 15. We will need to then get him into a better offloading boot for the left foot perhaps a total contact cast 11/10; edema control is quite good. Left anterior and left medial lower leg wounds in the setting of chronic venous insufficiency and lymphedema. He also has a substantial area over the left plantar first metatarsal head. 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 He has significant hypergranulation on the left plantar foot wound. The patient is a diabetic but he does not have significant PAD 11/17; edema control is quite good. Left anterior and left medial lower leg wounds look better. The really concerning area remains the area on the left plantar first metatarsal head. He has a rim of epithelialization. 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 Starting today with a forefoot off loader but . possibly a total contact cast. 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 Arrives with a very significant odor from I think all of the wounds on the left leg and the left foot. Because of this I did not put a total contact cast on him today but will could still consider this. His wife was having cataract surgery which is the reason he missed the appointment 12/6. I saw this man 5 days ago with a swelling below the popliteal fossa. I thought he actually might have a Baker's cyst however the DVT rule out study that we could arrange right away was negative the technician told me this was not a ruptured Baker's cyst. We attempted to get this aspirated by under ultrasound guidance in interventional  radiology however all they did was an ultrasound however it shows an extensive fluid collection 62 x 8 x 9.4 in the left thigh and left  calf. The patient states he thinks this started 8 days ago or so but he really is not complaining of any pain, fever or systemic symptoms. He has not ha 12/20; after some difficulty I managed to get the patient into see Dr. Randie Heinz. Eventually he was taken into the hospital and had a drain put in the fluid collection below his left knee posteriorly extending into the posterior thigh. He still has the drain in place. Culture of this showed moderate staff aureus few Morganella and few Klebsiella he is now on doxycycline and ciprofloxacin as suggested by infectious disease he is on this for a month. The drain will remain in place until it stops draining 12/29; he comes in today with the 1 wound on his left leg and the area on the left plantar first met head significantly smaller. Both look healthy. He still has the drain in the left leg. He says he has to change this daily. Follows up with Dr. Randie Heinz on January 11. 06/29/2021; the wounds that I am following on the left leg and left first met head continued to be quite healthy. However the area where his inferior drain is in place had copious amounts of drainage which was green in color. The wound here is larger. Follows up with Dr. Pascal Lux of vein and vascular his surgeon next week as well as infectious disease. He remains on ciprofloxacin and doxycycline. He is not complaining of excessive pain in either one of the drain areas 1/12; the patient saw vascular surgery and infectious disease. Vascular surgery has left the drain in place as there was still some notable drainage still see him back in 2 weeks. Dr. Dorthula Perfect stop the doxycycline and ciprofloxacin and I do not believe he follows up with them at this point. Culture I did last week showed both doxycycline resistant MRSA and Pseudomonas not sensitive to ciprofloxacin although  only in rare titers 1/19; the patient's wound on the left anterior lower leg is just about healed. We have continued healing of the area that was medially on the left leg. Left first plantar metatarsal head continues to get smaller. The major problem here is his 2 drain sites 1 on the left upper calf and lateral thigh. There is purulent drainage still from the left lateral thigh. I gave him antibiotics last week but we still have recultured. He has the drain in the area I think this is eventually going to have to come out. I suspect there will be a connecting wound to heal here perhaps with improved VAc 1/26; the patient had his drain removed by vein and vascular on 1/25/. This was a large pocket of fluid in his left thigh that seem to tunnel into his left upper calf. 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 His mention his Penrose drain was removed today. He now has a tunneling wound on his left calf and left thigh. Both of these probe widely towards each other although I cannot really prove that they connect. Both wounds on his lower leg anteriorly are closed and his area over the first metatarsal head on his right foot continues to improve. We are using Hydrofera Blue here. He also saw infectious disease culture of the abscess they noted was polymicrobial with MRSA, Morganella and Klebsiella he was treated with doxycycline and ciprofloxacin for 4 weeks ending on 07/03/2021. They did not recommend any further antibiotics. Notable that while he still had the Penrose drain in place last week he  had purulent drainage coming out of the inferior IandD site this grew [place] [facility], MRSA and Pseudomonas but there does not appear to be any active infection in this area today with the drain out and he is not systemically unwell 2/2; with regards to the drain sites the superior one on the thigh actually is closed down the one on the upper left lateral calf measures about 8 and  half centimeters which is an improvement seems to be less prominent although still with a lot of drainage. The only remaining wound is over the first metatarsal head on the left foot and this looks to be continuing to improve with Hydrofera Blue. 2/9; the area on his plantar left foot continues to contract. Callus around the wound edge. The drain sites specifically have not come down in depth. We put the wound VAC on Monday he changed the canister late last night our intake nurse reported a pocket of fluid perhaps caused by our compression wraps 2/16; continued improvement in left foot plantar wound. drainage site in the calf is not improved in terms of depth (wound vac) 2/23; continued improvement in the left foot wound over the first metatarsal head. With regards to the drain sites the area on his thigh laterally is healed however the open area on his calf is small in terms of circumference by still probes in by about 15 cm. Within using the wound VAC. Hydrofera Blue on his foot 08/24/2021: The left first metatarsal head wound continues to improve. The wound bed is healthy with just some surrounding callus. Unfortunately the open drain site on his calf remains open and tunnels at least 15 cm (the extent of a Q-tip). This is despite several weeks of wound VAC treatment. Based on reading back through the notes, there has been really no significant change in the depth of the wound, although the orifice is smaller and the more cranial wound on his thigh has closed. I suspect the tunnel tracks nearly all the way to this location. 08/31/2021: Continued improvement in the left first metatarsal head wound. There has been absolutely no improvement to the long tunnel from his open drain site on his calf. We have tried to get him into see vascular surgery sooner to consider the possibility of simply filleting the tract open and allowing it to heal from the bottom up, likely with a wound VAC. They have not yet  scheduled a sooner appointment than his current mid April 09/14/2021: He was seen by vascular surgery and they took him to the operating room last week. They opened a portion of the tunnel, but did not extend the entire length of the known open subcutaneous tract. I read Dr. Darcella Cheshire operative note and it is not clear from that documentation why only a portion of the tract was opened. The heaped up granulation tissue was curetted and removed from at least some portion of the tract. They did place a wound VAC and applied an Unna boot to the leg. The ulcer on his left first metatarsal head is smaller today. The bed looks good and there is just a small amount of surrounding callus. 09/21/2021: The ulcer on his left first metatarsal head looks to be stalled. There is some callus surrounding the wound but the wound bed itself does not appear particularly dynamic. The tunnel tract on his lateral left leg seems to be roughly the same length or perhaps slightly smaller but the wound bed appears healthy with good granulation tissue. He opened up  a new wound on his medial thigh and the site of a prior surgical incision. He says that he did this unconsciously in his sleep by scratching. 09/28/2021: Unfortunately, the ulcer on his left first metatarsal head has extended underneath the callus toward the dorsum of his foot. The medial thigh wounds are roughly the same. The tunnel on his lateral left leg continues to be problematic; it is longer than we are able to actually probe with a Q-tip. I am still not [name], KRIBS ([id number]) [id number] Page 5 of 21 certain as to why Dr. Randie Heinz did not open this up entirely when he took the patient to the operating room. We will likely be back in the same situation with just a small superficial opening in a long unhealed tract, as the open portion is granulating in nicely. 10/02/2021: The patient was initially scheduled for a nurse visit, but we are also  applying a total contact cast today. The plantar foot wound looks clean without significant accumulated callus. We have been applying Prisma silver collagen to the site. 10/05/2021: The patient is here for his first total contact cast change. We have tried using gauze packing strips in the tunnel on his lateral leg wound, but this does not seem to be working any better than the white VAC foam. The foot ulcer looks about the same with minimal periwound callus. Medial thigh wound is clean with just some overlying eschar. 10/12/2021: The plantar foot wound is stable without any significant accumulation of periwound callus. The surface is viable with good granulation tissue. The medial thigh wounds are much smaller and are epithelializing. On the other hand, he had purulent drainage coming from the tunnel on his lateral leg. He does go back to see Dr. Randie Heinz next week and is planning to ask him why the wound tunnel was not completely opened at the time of his most recent operation. 10/19/2021: The plantar foot wound is markedly improved and has epithelial tissue coming through the surface. The medial thigh wounds are nearly closed with just a tiny open area. He did see Dr. Randie Heinz earlier this week and apparently they did discuss the possibility of opening the sinus tract further and enabling a wound VAC application. Apparently there are some limits as to what Dr. 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 I think if we could get the tract open to the level of the popliteal fossa, this would greatly aid in her ability to get this chart closed. That being said, however, today when I probed the tract with a Q-tip, I was not able to insert the entirety of the Q-tip as I have on previous occasions. The tunnel is shorter by about 4 cm. The surface is clean with good granulation tissue and no further episodes of purulent drainage. 10/30/2021: Last week, the patient underwent surgery  and had the long tract in his leg opened. There was a rind that was debrided, according to the operative report. His medial thigh ulcers are closed. The plantar foot wound is clean with a good surface and some built up surrounding callus. 11/06/2021: The overall dimensions of the large wound on his lateral leg remain about the same, but there is good granulation tissue present and the tunneling is a little bit shorter. He has a new wound on his anterior tibial surface, in the same location where he had a similar lesion in the past. The plantar foot wound is clean with some buildup surrounding callus. Just  toward the medial aspect of his foot, however, there is an area of darkening that once debrided, revealed another opening in the skin surface. 11/13/2021: The anterior tibial surface wound is closed. The plantar foot wound has some surrounding callus buildup. The area of darkening that I debrided last week and revealed an opening in the skin surface has closed again. The tunnel in the large wound on his lateral leg has come in by about 3 cm. There is healthy granulation tissue on the entire wound surface. 11/23/2021: The patient was out of town last week and did wet-to-dry dressings on his large wound. 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 Unfortunately, he picked open the wound on his left medial thigh. He says that it was itching and he just could not stop scratching it until it was open again. The wound on his plantar foot is smaller and has not accumulated a tremendous amount of callus. The lateral leg wound is shallower and the tunnel has also decreased in depth. There is just a little bit of slough accumulation on the surface. 11/30/2021: Another portion of his left medial thigh has been opened up. All of these wounds are fairly superficial with just a little bit of slough and eschar accumulation. The wound on his plantar foot is  almost closed with just a bit of eschar and periwound callus accumulation. The lateral leg wound is nearly flush with the surrounding skin and the tunnel is markedly shallower. 12/07/2021: There is just 1 open area on his left medial thigh. It is clean with just a little bit of perimeter eschar. The wound on his plantar foot continues to contract and just has some eschar and periwound callus accumulation. The lateral leg wound is closing at the more distal aspect and the tunnel is smaller. The surface is nearly flush with the surrounding skin and it has a good bed of granulation tissue. 12/14/2021: The thigh and foot wounds are closed. The lateral leg wound has closed over approximately half of its length. The tunnel continues to contract and the surface is now flush with the surrounding skin. The wound bed has robust granulation tissue. 12/22/2021: The thigh and foot wounds have reopened. The foot wound has a lot of callus accumulation around and over it. The thigh wound is tiny with just a little bit of slough in the wound bed. The lateral leg wound continues to contract. His vascular surgeon took the wound VAC off earlier in the week and the patient has been doing wet-to-dry dressings. There is a little slough accumulation on the surface. The tunnel is about 3 cm in depth at this point. 12/28/2021: The thigh wound is closed again. The foot wound has some callus that subsequently has peeled back exposing just a small slit of a wound. The lateral leg wound Is down to about half the size that it originally was and the tunnel is down to about half a centimeter in depth. 01/04/2022: The thigh wound remains closed. The foot wound has heavy callus overlying the wound site. Once this was debrided, the wound was found to be closed. The lateral leg wound is smaller again this week and very superficial. No tunnel could be identified. 01/12/2022: The thigh and foot wounds both remain closed. The lateral leg wound is  now nearly flush with the skin surface. There is good granulation tissue present with a light layer of slough. 01/19/2022: Due to the way  his wrap was placed, the patient did not change the dressing on his thigh at all and so the foam was saturated and his skin is macerated. There is a light layer of slough on the wound surface. The underlying granulation tissue is robust and healthy-appearing. He has heavy callus buildup at the site of his first metatarsal head wound which is still healed. 02/01/2022: He has been in silver alginate. When he removed the dressing from his thigh wound, however, some leg, superficially reopening a portion of the wound that had healed. In addition, underneath the callus at his left first metatarsal head, there appears to be a blister and the wound appears to be open again. 02/08/2022: The lateral leg wound has contracted substantially. There is eschar and a light layer of slough present. He says that it is starting to pull and is uncomfortable. On inspection, there is some puckering of the scar and the eschar is quite dry; this may account for his symptoms. On his first metatarsal head, the wound is much smaller with just some eschar on the surface. The callus has not reaccumulated. He reports that he had a blister come up on his medial thigh wound at the distal aspect. It popped and there is now an opening in his skin again. Looking back through his library of wound photos, there is what looks like a permanent suture just deep to this location and it may be trying to erode through. We have been using silver alginate on his wounds. 02/15/2022: The lateral leg wound is about half the size it was last week. It is clean with just a little perimeter eschar and light slough. The wound on his first metatarsal head is about the same with heavy callus overlying it. The medial thigh wound is closed again. He does have some skin changes on the top of his foot that looks potentially  yeast related. 02/22/2022: The skin on the top of his foot improved with the use of a topical antifungal. The lateral leg wound continues to contract and is again smaller this week. There is a little bit of slough and eschar on the surface. The first metatarsal head wound is a little bit smaller but has reaccumulated a thick callus over the top. He decided to try to trim his toenail and ultimately took the entire nail off of his left great toe. 03/02/2022: His lateral leg wound continues to improve, as does the wound on his left great toe. Unfortunately, it appears that somehow his foot got wet and moisture seeped in through the opening causing his skin to lift. There is a large wound now overlying his first metatarsal on both the plantar, medial, and dorsal portion of his foot. There is necrotic tissue and slough present underneath the shaggy macerated skin. 03/08/2022: The lateral leg wound is smaller again today. There is just a light layer of slough and eschar on the surface. The great toe wound is smaller again today. The first metatarsal wound is a little bit smaller today and does not look nearly as necrotic and macerated. There is still slough and nonviable tissue present. 03/15/2022: The lateral leg wound is narrower and just has a little bit of light slough buildup. The first metatarsal wound still has a fair amount of moisture [name], [name] ([id number]) [id number] Page 6 of 21 affecting the periwound skin. The great toe wound is healed. 03/22/2022: The lateral leg wound is now isolated to just at the level of his knee. There is some  eschar and slough accumulation. The first metatarsal head wound has epithelialized tremendously and is about half the size that it was last week. He still has some maceration on the top of his foot and a fungal odor is present. 03/29/2022: T oday the patient's foot was macerated, suggesting that the cast got wet. The patient has also been  picking at his dry skin and has enlarged the wound on his left lateral leg. In the time between having his cast removed and my evaluation, he had picked more dry skin and opened up additional wounds on his Achilles area and dorsal foot. The plantar first metatarsal head wound, however, is smaller and clean with just macerated callus around the perimeter and light slough on the surface. The lateral leg wound measured a little bit larger but is also fairly clean with eschar and minimal slough. 04/02/2022: The patient had vascular studies done last Friday and so his cast was not applied. He is here today to have that done. 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 04/05/2022: Both wounds are smaller and quite clean. There is just a little biofilm on the lateral leg wound. 10/20; the patient has a wound on the left lateral surgical incision at the level of his lateral knee this looks clean and improved. He is using silver alginate. He also has an area on his left medial foot for which she is using Hydrofera Blue under a total contact cast both wounds are measuring smaller 04/20/2022: The plantar foot wound has contracted considerably and is very close to closing. The lateral leg wound was measured a little larger, but there was a tiny open area that was included in the measurements that was not included last week. He has some eschar around the perimeter but otherwise the wound looks clean. 04/27/2022: The lateral leg wound looks better this week. He says that midweek, he felt it was very dry and began applying hydrogel to the site. I think this was beneficial. The foot wound is nearly closed underneath a thick layer of dry skin and callus. 05/04/2022: The foot wound is healed. He has developed a new small ulcer on his anterior tibial surface about midway up his leg. It has a little slough on the surface. The lateral leg wound still is fairly dry, but clean with just a little biofilm on the  surface. 05/11/2022: The wound on his foot reopened on Wednesday. A large blister formed which then broke open revealing the fat layer underneath. The ulcer on his anterior tibial surface is a little bit larger this week. The lateral leg wound has much better moisture balance this week. Fortunately, prior to his foot wound reopening, he did get the cast made for his orthotic. 05/15/2022: Already, the left medial foot wound has improved. The tissue is less macerated and the surface is clean. The ulcer on his anterior tibial surface continues to enlarge. This seems likely secondary to accumulated moisture. The lateral leg wound continues to have an improved moisture balance with the use of collagen. 05/25/2022: The medial foot wound continues to contract. It is now substantially smaller with just a little slough on the surface. The anterior tibial surface wound continues to enlarge further. Once again, this seems to be secondary to moisture. The lateral leg wound does not seem to be changing much in size, but the moisture balance is better. 06/01/2022: The anterior tibial wound is closed. The medial foot wound is down to just a very small, couple of millimeters,  opening. The lateral leg wound has good moisture balance, but remains unchanged in size. 12/15; the patient's anterior tibial wound has reopened, however the area on his right first metatarsal head is closed. The major wound is actually on the superior part of his surgical wound in the left lateral thigh. Not a completely viable surface under illumination. This may at some point require a debridement I think he is currently using Prisma. As noted the left medial foot wound has closed 06/14/2022: The anterior tibial wound has closed. The lateral leg wound has a better surface but is basically unchanged in size. The left medial foot wound has reopened. It looks as though there was some callus accumulation and moisture got under the callus which  caused the tissue to break down again. 06/21/2022: A new wound has opened up just distal to the previous anterior tibial wound. It is small but has hypertrophic granulation tissue present. The lateral leg wound is a little bit narrower and has a layer of slough on the surface. The left medial foot wound is down to just a pinhole. His custom orthotics should be available next week. 06/28/2022: The wound on his first metatarsal head has healed. He has developed a new small wound on his medial lower leg, in an old scar site. The lateral leg wound continues to contract but continues to accumulate slough, as well. 07/03/2022: Despite wearing his custom orthopedic shoes, he managed to reopen the wound on his first metatarsal head. He says he thinks his foot got wet and then some skin lifted up and he peeled this away. Both of the lower leg wounds are smaller and have some dry eschar on the surface. The lateral leg wound is quite a bit narrower today. 07/12/2022: The medial lower leg wound is closed. The anterior lower leg wound has contracted considerably. The lateral upper leg wound is narrower with a layer of slough on the surface. The first metatarsal head wound is also smaller, but had copious drainage which saturated the foam border dressing and resulted in some periwound tissue maceration. Fortunately there was no breakdown at this site. 07/19/2022: The lower leg shows signs of significant maceration; I think he must be sweating excessively inside his cast. There are several areas of skin breakdown present. The wound on his foot is smaller and that on his lateral leg is narrower and is shorter by about a centimeter. 07/26/2022: Last week we used a zinc Coflex wrap prior to applying his total contact cast and this has had the effect of keeping his skin from getting macerated this week. The anterior leg wound has epithelialized substantially. The lateral leg wound is significantly smaller with just a bit of  slough on the surface. The first metatarsal head wound is also smaller this week. 08/02/2022: The anterior leg wound was closed on arrival, but while he was sitting in the room, he picked it open again. The lateral leg wound is smaller with just a little slough on the surface and the first metatarsal head wound has contracted further, as well. 08/09/2022: The first metatarsal head wound is covered with callus. Underneath the callus, it is nearly completely closed. The lateral leg wound is smaller again this week. The anterior leg wound looks better, but he has such heavy buildup of old skin, that moisture is getting underneath this, becoming trapped, and causing the underlying skin to get macerated and open up. 08/16/2022: The first metatarsal head wound is closed. The lateral leg wound continues to contract  and is quite a bit smaller again this week. There is just a small, superficial opening remaining on his anterior tibial surface. 08/23/2022: The first metatarsal head wound has, by some miracle, remained closed. The lateral leg wound is substantially smaller with multiple areas of epithelialization. The anterior tibial surface wound is also quite a bit smaller and very clean. 08/30/2022: Unfortunately, his first metatarsal head wound opened up again. It happened in the same fashion as it has on prior occasions. Moisture got under dried skin/callus and created a wound when he removed his sock, taking the skin with it. The anterior tibial surface has a thick shell of hyperkeratotic skin. This has been contributing to ongoing repeat wounding events as moisture gets underneath this and causes tissue breakdown. 3/15; patient presents for follow-up. His anterior left leg wound has healed. He still has the wound to the left lateral aspect and left first met head. We have [name], [name] ([id number]) [id number] Page 7 of 21 been using silver alginate and endoform to these areas under  [employer]. He has no issues or complaints today. He has been taking Augmentin and reports improvement to his symptoms to the left first met head. 09/13/2022: He has accumulated more thick dry skin in sheets on his lower leg. The lateral leg wound is about the same size and the left first metatarsal head wound is a little bit smaller. There is slough on both surfaces. There is callus buildup around the foot wound. 09/20/2022: The lateral leg wound is a little bit narrower and the left first metatarsal head wound also seems to have contracted slightly. There is slough on both surfaces. He has a little skin breakdown on his anterior tibial surface. 09/27/2022: The lateral leg wound continues to contract and is quite clean. The first metatarsal head wound is also smaller. There is some perimeter callus and slough accumulation on the foot. The anterior tibial surface is closed. 10/04/2022: Both of his wounds are smaller today, particularly the first metatarsal head wound. 10/18/2022: He missed his appointment last week and ended up cutting off his wrap on Saturday. The anterior tibial wound reopened. It is fairly superficial with a little bit of slough on the surface. His lateral leg wound is smaller with some slough and eschar buildup. The first metatarsal head wound is also smaller with some callus and slough accumulation. 10/25/2022: All wounds are smaller. There is slough and eschar on the lateral leg and slough and callus on the plantar foot wound. The anterior tibial wound is clean and flush with the surrounding skin. No debris accumulation here. 11/01/2022: The wounds are all smaller again this week. There is slough on the lateral leg and some minimal slough and eschar on the anterior tibial wound. There is callus accumulation on the first metatarsal head site, along with slough. There is also a yeasty odor coming from the foot. 11/08/2022: The lateral leg wound is smaller except where he picked some skin  while waiting to be seen. There is a little bit of slough on the surface. The anterior tibial wound is closed. The first metatarsal head site has gotten macerated once again and has a lot of spongy wet tissue and callus around it. No yeast odor today. 11/15/2022: The lateral leg wound continues to contract. There is now a band of epithelium dividing it into 2 areas. Minimal slough accumulation. He managed to pick open a new wound on his medial lower leg. The first metatarsal head site is smaller with some  callus accumulation but no tissue maceration. 11/22/2022: The lateral leg wound is smaller again by about a third today. There is a little bit of slough on the surface with some periwound eschar. The new wound that he picked open last week has healed but he picked open 3 new small wounds on his anterior tibial surface, once again due to his picking at his skin. The first metatarsal head wound looks about the same. There has been some callus accumulation and there is more edema present, as he was not put in a compression wrap last week. 11/29/2022: The lateral leg wound is smaller again today. There is a little periwound eschar and some slough present. The wounds on his anterior tibial surface are all closed except for 1 that has a little bit of slough on the open portion with eschar covering it. The first metatarsal head wound measured a little bit smaller today, but mostly looks about the same. Edema control is better. 12/06/2022: The anterior tibial wound is closed. The lateral leg wound is smaller with some slough and eschar accumulation. The plantar foot ulcer is about the same size, but the tissue does show some evidence of the fact that he was on his feet quite a bit more this past week; there was a death in his family. There has been more callus accumulation and the tissues are little bit more purpleish. 12/13/2022: He picked the skin on his anterior tibia and reopened a wound there while he was  waiting to be seen in clinic. The lateral leg wound is much smaller with a little bit of slough and eschar. The plantar foot ulcer is smaller today with callus and slough buildup, but without the pressure induced tissue injury that was seen last week. 12/20/2022: The anterior tibial wound has healed. The lateral leg wound is smaller again this week. There is a little bit of eschar buildup on the surface. The foot had a fairly strong odor coming from it that persisted even after washing. The wound itself looks like its gotten a little smaller but he has built up thick callus, once again. 12/26/2022: The lateral leg wound is down to just a narrow superficial slit. There is slough and a little bit of dry skin present. The foot is in much better shape today. There is less callus accumulation and no odor. The skin edges are starting to roll inward, however. 01/03/2023: The lateral leg wound is healed. The skin is quite dry but the wound has closed. The foot continues to contract. He has a fair amount of periwound callus accumulation and the surface of the is a little drier than ideal. 01/10/2023: The large lateral leg wound remains closed. He managed to pick off some of his dry skin and has multiple small open superficial wounds on his lower leg. The foot wound measures smaller, but he has a blister immediately adjacent to it. 01/17/2023: The large lateral leg wound remains closed. The small superficial wounds on his lower leg have also closed. The foot wound is about the same and the blister area immediately adjacent to it has dark eschar on the surface. The foot wound looks a bit dry. 01/24/2023: He picked at some dry skin on his lateral leg wound and reopened. The surface is dry and fibrotic. The foot wound is slightly smaller but has periwound callus and the edges are rolling inward again. 01/31/2023: His lateral leg wound is larger again today. It appears that the Ace bandage may be rubbing and causing  some  tissue breakdown. His foot wound is also a bit larger and the tissue does not appear particularly healthy. 02/07/2023: The lateral leg wound improved quite significantly over the last week. He has been applying additional gauze over the site before applying the Ace bandage and this seems to have minimized the rubbing. The foot wound is about the same. The culture that I took last week returned with a polymicrobial population of predominantly skin flora. He is currently taking Augmentin as recommended by the culture data. 02/14/2023: The lateral leg wound is covered in a layer of eschar. Underneath, the wound is nearly healed again. The foot wound looks better today. The depth around the edges has decreased. The tissue has a healthier appearance. He is still taking Augmentin. 02/21/2023: The lateral leg wound is smaller with a layer of eschar on the surface, but is not quite healed. The foot wound is stable but the depth around the edges is almost completely obliterated. There is some callus and senescent skin around the edges. 03/07/2023: The lateral leg wound is down to just a pinhole with a little bit of eschar on the surface. The foot wound demonstrates evidence that he has been walking excessively the past week and there is more callus with moisture underneath it, but the breakdown is limited to the callus layer and there is still intact skin underneath. Electronic Signature(s) Signed: 03/07/2023 10:47:26 AM By: [name] MD FACS Entered By: [name] on 03/07/2023 10:47:26 Hakanson, Dola Factor ([id number]) [id number] Page 8 of 21 -------------------------------------------------------------------------------- Physical Exam Details Patient Name: Date of Service: [name], [name] 03/07/2023 10:00 A M Medical Record Number: [id number] Patient Account Number: [id number] Date of Birth/Sex: Treating RN: 04-23-1951 (72 y.o. M) Primary Care Provider: [name] Other  Clinician: Referring Provider: Treating Provider/Extender: [name] in Treatment: 99 Constitutional Hypertensive, asymptomatic. . . . no acute distress. Respiratory Normal work of breathing on room air. Notes 03/07/2023: The lateral leg wound is down to just a pinhole with a little bit of eschar on the surface. The foot wound demonstrates evidence that he has been walking excessively the past week and there is more callus with moisture underneath it, but the breakdown is limited to the callus layer and there is still intact skin underneath. Electronic Signature(s) Signed: 03/07/2023 10:50:39 AM By: [name] MD FACS Entered By: [name] on 03/07/2023 10:50:39 -------------------------------------------------------------------------------- Physician Orders Details Patient Name: Date of Service: [name] Grapes. 03/07/2023 10:00 A M Medical Record Number: [id number] Patient Account Number: [id number] Date of Birth/Sex: Treating RN: 1951-01-04 (72 y.o. 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 - Dr Lady Gary - Room 2 Return A Anesthetic (In clinic) Topical  Lidocaine 4% applied to wound bed Bathing/ Shower/ Hygiene May shower with protection but do not get wound dressing(s) wet. Protect dressing(s) with water repellant cover (for example, large plastic bag) or a  cast cover and may then take shower. Edema Control - Lymphedema / SCD / Other Elevate legs to the level of the heart or above for 30 minutes daily and/or when sitting for 3-4 times a day throughout the day. KIAN, BASSINGER ([id number]) [id number] Page 9 of 21 Avoid standing for long periods of time. Patient to wear own compression stockings every day. - both legs daily Moisturize legs daily. Compression stocking or Garment 20-30 mm/Hg pressure to: Off-Loading Wound #18R Left,Plantar Metatarsal head first Open toe surgical shoe to: - Front off loader Left ft Other: - minimal weight bearing left foot Additional Orders / Instructions Follow Nutritious Diet - vitamin C 500 mg 3 times a day and zinc 30-50 mg a day Wound Treatment Wound #18R - Metatarsal head first Wound Laterality: Plantar, Left Cleanser: Soap and Water 1 x Per Week/30 Days Discharge Instructions: May shower and wash wound with dial antibacterial soap and water prior to dressing change. Cleanser: Wound Cleanser 1 x Per Week/30 Days Discharge Instructions: Cleanse the wound with wound cleanser prior to applying a clean dressing using gauze sponges, not tissue or cotton balls. Peri-Wound Care: Ketoconazole Cream 2% 1 x Per Week/30 Days Discharge Instructions: Apply Ketoconazole as directed Peri-Wound Care: Triamcinolone 15 (g) 1 x Per Week/30 Days Discharge Instructions: Use triamcinolone 15 (g) as directed Topical: Gentamicin 1 x Per Week/30 Days Discharge Instructions: As directed by physician Topical: Mupirocin Ointment 1 x Per Week/30 Days Discharge Instructions: Apply Mupirocin (Bactroban) as instructed Prim Dressing: Maxorb Extra Ag+ Alginate Dressing, 2x2 (in/in) 1 x Per Week/30  Days ary Discharge Instructions: Apply to wound bed as instructed Secondary Dressing: Optifoam Non-Adhesive Dressing, 4x4 in (Generic) 1 x Per Week/30 Days Discharge Instructions: Apply over primary dressing as directed. Secondary Dressing: Woven Gauze Sponges 2x2 in (Generic) 1 x Per Week/30 Days Discharge Instructions: Apply over primary dressing as directed. Secondary Dressing: Zetuvit Absorbent Pad, 4x4 (in/in) 1 x Per Week/30 Days Compression Wrap: Urgo K2, (equivalent to a 4 layer) two layer compression system, regular 1 x Per Week/30 Days Discharge Instructions: Apply Urgo K2 as directed (alternative to 4 layer compression). Wound #22R - Lower Leg Wound Laterality: Left, Lateral, Proximal Cleanser: Soap and Water 1 x Per Day/30 Days Discharge Instructions: May shower and wash wound with dial antibacterial soap and water prior to dressing change. Cleanser: Wound Cleanser 1 x Per Day/30 Days Discharge Instructions: Cleanse the wound with wound cleanser prior to applying a clean dressing using gauze sponges, not tissue or cotton balls. Prim Dressing: Endoform 2x2 in 1 x Per Day/30 Days ary Discharge Instructions: Moisten with saline Secondary Dressing: Woven Gauze Sponge, Non-Sterile 4x4 in 1 x Per Day/30 Days Discharge Instructions: Apply over primary dressing as directed. Secured With: Elastic Bandage 4 inch (ACE bandage) 1 x Per Day/30 Days Discharge Instructions: Secure with ACE bandage as directed. Secured With: [employer], 4.5x3.1 (in/yd) 1 x Per Day/30 Days Discharge Instructions: Secure with Kerlix as directed. Patient Medications llergies: No Known Drug Allergies A Notifications Medication Indication Start End 03/07/2023 lidocaine DOSE topical 4 % cream - cream topical GURMINDER, NAJAFI ([id number]) [id number] Page 10 of 21 Electronic Signature(s) Signed: 03/07/2023 12:00:58 PM By: [name] MD FACS Entered By: [name] on  03/07/2023 10:51:15 -------------------------------------------------------------------------------- Problem List Details Patient Name: Date of Service: [name] Grapes. 03/07/2023 10:00 A M Medical Record Number: [id number] Patient Account Number: [id number] Date of Birth/Sex: Treating RN: 1950/07/03 (72 y.o. 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 01/24/2023 No Yes I87.322 Chronic venous hypertension (idiopathic) with inflammation of left lower 04/12/2021 No Yes extremity E11.51 Type 2 diabetes mellitus with diabetic peripheral angiopathy without gangrene 04/12/2021 No Yes I89.0 Lymphedema, not elsewhere classified 04/12/2021 No Yes L85.9 Epidermal thickening, unspecified 08/30/2022 No Yes Inactive Problems ICD-10 Code Description Active Date Inactive Date E11.621 Type 2 diabetes mellitus with foot ulcer 04/12/2021 04/12/2021 E11.42 Type 2 diabetes mellitus with diabetic polyneuropathy 04/12/2021 04/12/2021 L02.416 Cutaneous abscess of left lower limb 06/13/2021 06/13/2021 Resolved Problems ICD-10 Code Description Active Date Resolved Date L97.828 Non-pressure chronic ulcer of other part of left lower leg with other specified severity 04/12/2021 04/12/2021 L97.828 Non-pressure chronic ulcer of other part of left lower leg with other specified severity 01/10/2023 06/08/2022 [name] ([id number]) [phone].pdf Page 11 of 21 Electronic Signature(s) Signed: 03/07/2023 10:41:43 AM By: [name] MD FACS Entered By: [name] on 03/07/2023  10:41:43 -------------------------------------------------------------------------------- Progress Note Details Patient Name: Date of Service: [name] Grapes. 03/07/2023 10:00 A M Medical Record Number: [id number] Patient Account Number: [id number] Date of Birth/Sex: Treating RN: 1951/05/14 (72 y.o. M) Primary Care Provider: [name] Other Clinician: Referring Provider: Treating Provider/Extender: [name] in Treatment: 99 Subjective Chief Complaint Information obtained from Patient Left leg and foot ulcers 04/12/2021; patient is here for wounds on his left lower leg and left plantar foot over the first metatarsal head History of Present Illness (HPI) 10/11/17; Mr. Galinski is a 72 year old man who tells me that in 2015 he slipped down the latter traumatizing his left leg. He developed a wound in the same spot the area that we are currently looking at. He states this closed over for the most part although he always felt it was somewhat unstable. In 2016 he hit the same area with the door of his car had this reopened. He tells me that this is never really closed although sometimes an inflow it remains open on a constant basis. He has not been using any specific dressing to this except for topical antibiotics the nature of which were not really sure. His primary doctor did send him to see Dr. Jacinto Halim of interventional cardiology. He underwent an angiogram on 08/06/17 and he underwent a PTA and directional atherectomy of the lesser distal SFA and popliteal arteries which resulted in brisk improvement in blood flow. It was noted that he had 2 vessel runoff through the anterior tibial and peroneal. He is also been to see vascular and interventional radiologist. He was not felt to have any significant superficial venous insufficiency. Presumably is not a candidate for any ablation. It was suggested he come here for wound care. The patient is a type II diabetic on insulin. He  also has a history of venous insufficiency. ABIs on the left were noncompressible in our clinic 10/21/17; patient we admitted to the clinic last week. 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 We put Iodosorb on him after an aggressive debridement and 3 layer compression. He complained of pain in his ankle and itching with is skin in fact he scratched the area on the medial calf superiorly at the rim of our  wraps and he has 2 small open areas in that location today which are new. I changed his primary dressing today to silver collagen. As noted he is already had revascularization and does not have any significant superficial venous insufficiency that would be amenable to ablation 10/28/17; patient admitted to the clinic 2 weeks ago. He has a smaller Wound. Scratch injury from last week revealed. There is large wound over the tibial area. This is smaller. Granulation looks healthy. No need for debridement. 11/04/17; the wound on the left lateral calf looks better. Improved dimensions. Surface of this looks better. We've been maintaining him and Kerlix Coban wraps. He finds this much more comfortable. Silver collagen dressing 11/11/17; left lateral Wound continues to look healthy be making progress. Using a #5 curet I removed removed nonviable skin from the surface of the wound and then necrotic debris from the wound surface. Surface of the wound continues to look healthy. He also has an open area on the left great toenail bed. We've been using topical antibiotics. 11/19/17; left anterior lateral wound continues to look healthy but it's not closed. He also had a small wound above this on the left leg Initially traumatic wounds in the setting of significant chronic venous insufficiency and stasis dermatitis 11/25/17; left anterior wounds superiorly is closed still a small wound inferiorly. 12/02/17; left anterior tibial area. Arrives today with  adherent callus. Post debridement clearly not completely closed. Hydrofera Blue under 3 layer compression. 12/09/17; left anterior tibia. Circumferential eschar however the wound bed looks stable to improved. We've been using Hydrofera Blue under 3 layer compression 12/17/17; left anterior tibia. Apparently this was felt to be closed however when the wrap was taken off there is a skin tear to reopen wounds in the same area we've been using Hydrofera Blue under 3 layer compression 12/23/17 left anterior tibia. Not close to close this week apparently the [facility] was stuck to this again. Still circumferential eschar requiring debridement. I put a contact layer on this this time under the Hydrofera Blue 12/31/17; left anterior tibia. Wound is better slight amount of hyper-granulation. Using Hydrofera Blue over Adaptic. 01/07/18; left anterior tibia. The wound had some surface eschar however after this was removed he has no open wound.he was already revascularized by Dr. Jacinto Halim when he came to our clinic with atherectomy of the left SFA and popliteal artery. He was also sent to interventional radiology for venous reflux studies. He was not felt to have significant reflux but certainly has chronic venous changes of his skin with hemosiderin deposition around this area. He will definitely need to lubricate his skin and wear compression stocking and I've talked to him about this. READMISSION 05/26/2018 This is a now 72 year old man we cared for with traumatic wounds on his left anterior lower extremity. He had been previously revascularized during that admission by Dr. Jacinto Halim. Apparently in follow-up Dr. Jacinto Halim noted that he had deterioration in his arterial status. He underwent a stent placement in the distal left SFA on 04/22/2018. 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 He went back to the angiography suite on 04/30/2018 he underwent PTA and balloon angioplasty of the occluded left mid  anterior tibial artery, thrombotic occlusion went from 100 to 0% which reconstitutes the posterior tibial artery. He had thrombectomy and aspiration of the peroneal artery. The stent placed in the distal SFA left SFA was still occluded. He was discharged on Xarelto, it was noted on the discharge summary from this hospitalization that he had  gangrene at the tip of his left fifth toe and there were expectations this would auto amputate. 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 [name], [name] ([id number]) [id number] Page 12 of 21 He has been recuperating at [facility] home in [facility] after the most recent hospitalization. He is going home tomorrow. He tells me that 2 weeks ago he traumatized the tip of his left fifth toe. He came in urgently for our review of this. This was a history of before I noted that Dr. Jacinto Halim had already noted dry gangrenous changes of the left fifth toe 06/09/2018; 2-week follow-up. I did contact Dr. Jacinto Halim after his last appointment and he apparently saw 1 of Dr. Verl Dicker colleagues the next day. He does not follow-up with Dr. Jacinto Halim himself until Thursday of this week. 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 Nevertheless there is no evidence of infection no drainage and no pain. He had a new area that this week when we were signing him in today on the left anterior mid tibia area, this is in close proximity to the previous wound we have dealt with in this clinic. 06/23/2018; 2-week follow-up. I did not receive a recent note from Dr. Jacinto Halim to review today. Our office is trying to obtain this. He is apparently not planning to do further vascular interventions and wondered about compression to try and help with the patient's chronic venous insufficiency. However we are also concerned about the arterial flow. He arrives in clinic today with a new area on the left third toe.  The areas on the calf/anterior tibia are close to closing. The left fifth toe is still mummified using Betadine. -In reviewing things with the patient he has what sounds like claudication with mild to moderate amount of activity. 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 I prescribed Levaquin over the phone while we attempted to arrange a plan of care. However the patient called yesterday to report he had low-grade fever and he came in today acutely. There is been a marked deterioration in the left third toe with spreading cellulitis up into the dorsal left foot. He was referred to the emergency room. Readmission: 06/29/2020 patient presents today for reevaluation here in our clinic he was previously treated by Dr. Leanord Hawking at the latter part of 2019 in 2 the beginning of 2020. Subsequently we have not seen him since that time in the interim he did have evaluation with vein and vascular specialist specifically Dr. Bo Mcclintock who did perform quite extensive work for a left femoral to anterior tibial artery bypass. With that being said in the interim the patient has developed significant lymphedema and has wounds that he tells me have really never healed in regard to the incision site on the left leg. He also has multiple wounds on the feet for various reasons some of which is that he tends to pick at his feet. Fortunately there is no signs of active infection systemically at this time he does have some wounds that are little bit deeper but most are fairly superficial he seems to have good blood flow and overall everything appears to be healthy I see no bone exposed and no obvious signs of osteomyelitis. I do not know that he necessarily needs a x-ray at this point although that something we could consider depending on how things progress. The patient does have a  history of lymphedema, diabetes, this is type II, chronic kidney disease stage III, hypertension, and history  of peripheral vascular disease. 07/05/2020; patient admitted last week. Is a patient I remember from 2019 he had a spreading infection involving the left foot and we sent him to the hospital. 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 He subsequently had a left femoral to anterior tibial bypass by Dr.[facility]. He also has severe lymphedema with chronic skin changes related to that on the left leg. The most problematic area that was new today was on the left medial great toe. This was apparently a small area last week there was purulent drainage which our intake nurse cultured. Also areas on the left medial foot and heel left lateral foot. He has 2 areas on the left medial calf left lateral calf in the setting of the severe lymphedema. 07/13/2020 on evaluation today patient appears to be doing better in my opinion compared to his last visit. The good news is there is no signs of active infection systemically and locally I do not see any signs of infection either. He did have an x-ray which was negative that is great news he had a culture which showed MRSA but at the same time he is been on the doxycycline which has helped. I do think we may want to extend this for 7 additional days 1/25; patient admitted to the clinic a few weeks ago. 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 We have been putting him under compression his edema control is a lot better but he is severe verricused skin on the left leg. He is really done quite well he still has an open area on the left medial calf and the left medial first metatarsal head. We have been using silver collagen on the leg silver alginate on the foot 07/27/2020 upon evaluation today patient appears to be doing decently well in regard to his wounds. He still has a lot of dry skin on the left leg. Some of this is starting to peel back and I think he may be able to have  them out by removing some that today. Fortunately there is no signs of active infection at this time on the left leg although on the right leg he does appear to have swelling and erythema as well as some mild warmth to touch. This does have been concerned about the possibility of cellulitis although within the differential diagnosis I do think that potentially a DVT has to be at least considered. We need to rule that out before proceeding would just call in the cellulitis. Especially since he is having pain in the posterior aspect of his calf muscle. 2/8; the patient had seen sparingly. He has severe skin changes of chronic lymphedema in the left leg thickened hyperkeratotic verrucous skin. He has an open wound on the medial part of the left first met head left mid tibia. He also has a rim of nonepithelialized skin in the anterior mid tibia. He brought in the AmLactin lotion that was been prescribed although I am not sure under compression and its utility. There concern about cellulitis on the right lower leg the last time he was here. He was put on on antibiotics. His DVT rule out was negative. The right leg looks fine he is using his stocking on this area 08/10/2020 upon evaluation today patient appears to be doing well  with regard to his leg currently. He has been tolerating the dressing changes without complication. Fortunately there is no signs of active infection which is great news. Overall very pleased with where things stand. 2/22; the patient still has an area on the medial part of the left first met his head. This looks better than when I last saw this earlier this month he has a rim of epithelialization but still some surface debris. Mostly everything on the left leg is healed. There is still a vulnerable in the left mid tibia area. 08/30/2020 upon evaluation today patient appears to be doing much better in regard to his wounds on his foot. Fortunately there does not appear to be any signs of  active infection systemically though locally we did culture this last week and it does appear that he does have MRSA currently. Nonetheless I think we will address that today I Minna send in a prescription for him in that regard. Overall though there does not appear to be any signs of significant worsening. 09/07/2020 on evaluation today patient's wounds over his left foot appear to be doing excellent. I do not see any signs of infection there is some callus buildup this can require debridement for certain but overall I feel like he is managing quite nicely. He still using the AmLactin cream which has been beneficial for him as well. 3/22; left foot wound is closed. There is no open area here. He is using ammonium lactate lotion to the lower extremities to help exfoliate dry cracked skin. He has compression stockings from elastic therapy in [place]. The wound on the medial part of his left first met head is healed today. READMISSION 04/12/2021 Mr. Varble is a patient we know fairly well he had a prolonged stay in clinic in 2019 with wounds on his left lateral and left anterior lower extremity in the setting of chronic venous insufficiency. More recently he was here earlier this year with predominantly an area on his left foot first metatarsal head plantar and he says the plantar foot broke down on its not long after we discharged him but he did not come back here. The last few months areas of broken down on his left anterior and again the left lateral lower extremity. The leg itself is very swollen chronically enlarged a lot of hyperkeratotic dry Berry Q skin in the left lower leg. His edema extends well into the thigh. He was seen by Dr. Randie Heinz. He had ABIs on 03/02/2021 showing an ABI on the right of 1 with a TBI of 0.72 his ABI in the left at 1.09 TBI of 0.99. Monophasic and biphasic waveforms on the right. On the left monophasic waveforms were noted he went on to have an angiogram on 03/27/2021 this  showed the aortic aortic and iliac segments were free of flow-limiting stenosis the left common femoral vein to evaluate the left femoral to anterior tibial artery bypass was unobstructed the bypass was patent without any areas of stenosis. We discharged the patient in bilateral juxta lite stockings but very clearly that was not sufficient to control the swelling and maintain skin integrity. He is clearly going to need compression pumps. 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 This is fortunate because he is on his feet for long periods of time. 10/27; patient comes in with our intake nurse reporting copious amount of green drainage from the left anterior mid tibia  the left dorsal foot and to a lesser [name], [name] ([id number]) [id number] Page 13 of 21 extent the left medial mid tibia. We left the compression wrap on all week for the amount of edema in his left leg is quite a bit better. We use silver alginate as the primary dressing 11/3; edema control is good. Left anterior lower leg left medial lower leg and the plantar first metatarsal head. The left anterior lower leg required debridement. Deep tissue culture I did of this wound showed MRSA I put him on 10 days of doxycycline which she will start today. We have him in compression wraps. He has a security card and [employer] however he is retiring on November 15. We will need to then get him into a better offloading boot for the left foot perhaps a total contact cast 11/10; edema control is quite good. Left anterior and left medial lower leg wounds in the setting of chronic venous insufficiency and lymphedema. He also has a substantial area over the left plantar first metatarsal head. 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 He has significant hypergranulation on the left  plantar foot wound. The patient is a diabetic but he does not have significant PAD 11/17; edema control is quite good. Left anterior and left medial lower leg wounds look better. The really concerning area remains the area on the left plantar first metatarsal head. He has a rim of epithelialization. 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 Starting today with a forefoot off loader but . possibly a total contact cast. 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 Arrives with a very significant odor from I think all of the wounds on the left leg and the left foot. Because of this I did not put a total contact cast on him today but will could still consider this. His wife was having cataract surgery which is the reason he missed the appointment 12/6. I saw this man 5 days ago with a swelling below the popliteal fossa. I thought he actually might have a Baker's cyst however the DVT rule out study that we could arrange right away was negative the technician told me this was not a ruptured Baker's cyst. We attempted to get this aspirated by under ultrasound guidance in interventional radiology however all they did was an ultrasound however it shows an extensive fluid collection 62 x 8 x 9.4 in the left thigh and left calf. The patient states he thinks this started 8 days ago or so but he really is not complaining of any pain, fever or systemic symptoms. He has not ha 12/20; after some difficulty I managed to get the patient into see Dr. Randie Heinz. Eventually he was taken into the hospital and had a drain put in the fluid collection below his left knee posteriorly extending into the posterior thigh. He still has the drain in place. Culture of this showed moderate staff aureus few  Morganella and few Klebsiella he is now on doxycycline and ciprofloxacin as suggested by infectious disease he is on this for a month. The drain will remain in place until it stops draining 12/29; he comes  in today with the 1 wound on his left leg and the area on the left plantar first met head significantly smaller. Both look healthy. He still has the drain in the left leg. He says he has to change this daily. Follows up with Dr. Randie Heinz on January 11. 06/29/2021; the wounds that I am following on the left leg and left first met head continued to be quite healthy. However the area where his inferior drain is in place had copious amounts of drainage which was green in color. The wound here is larger. Follows up with Dr. Pascal Lux of vein and vascular his surgeon next week as well as infectious disease. He remains on ciprofloxacin and doxycycline. He is not complaining of excessive pain in either one of the drain areas 1/12; the patient saw vascular surgery and infectious disease. Vascular surgery has left the drain in place as there was still some notable drainage still see him back in 2 weeks. Dr. Dorthula Perfect stop the doxycycline and ciprofloxacin and I do not believe he follows up with them at this point. Culture I did last week showed both doxycycline resistant MRSA and Pseudomonas not sensitive to ciprofloxacin although only in rare titers 1/19; the patient's wound on the left anterior lower leg is just about healed. We have continued healing of the area that was medially on the left leg. Left first plantar metatarsal head continues to get smaller. The major problem here is his 2 drain sites 1 on the left upper calf and lateral thigh. There is purulent drainage still from the left lateral thigh. I gave him antibiotics last week but we still have recultured. He has the drain in the area I think this is eventually going to have to come out. I suspect there will be a connecting wound to heal here perhaps with  improved VAc 1/26; the patient had his drain removed by vein and vascular on 1/25/. This was a large pocket of fluid in his left thigh that seem to tunnel into his left upper calf. 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 His mention his Penrose drain was removed today. He now has a tunneling wound on his left calf and left thigh. Both of these probe widely towards each other although I cannot really prove that they connect. Both wounds on his lower leg anteriorly are closed and his area over the first metatarsal head on his right foot continues to improve. We are using Hydrofera Blue here. He also saw infectious disease culture of the abscess they noted was polymicrobial with MRSA, Morganella and Klebsiella he was treated with doxycycline and ciprofloxacin for 4 weeks ending on 07/03/2021. They did not recommend any further antibiotics. Notable that while he still had the Penrose drain in place last week he had purulent drainage coming out of the inferior IandD site this grew [facility], MRSA and Pseudomonas but there does not appear to be any active infection in this area today with the drain out and he is not systemically unwell 2/2; with regards to the drain sites the superior one on the thigh actually is closed down the one on the upper left lateral calf measures about 8 and half centimeters which is an improvement seems to be less prominent although still with a lot of drainage. The only remaining wound is over the first metatarsal head on the left foot and this looks to be continuing to improve with Hydrofera Blue. 2/9; the area on his plantar left foot  continues to contract. Callus around the wound edge. The drain sites specifically have not come down in depth. We put the wound VAC on Monday he changed the canister late last night our intake nurse reported a pocket of fluid perhaps caused by our compression wraps 2/16; continued improvement in left foot plantar  wound. drainage site in the calf is not improved in terms of depth (wound vac) 2/23; continued improvement in the left foot wound over the first metatarsal head. With regards to the drain sites the area on his thigh laterally is healed however the open area on his calf is small in terms of circumference by still probes in by about 15 cm. Within using the wound VAC. Hydrofera Blue on his foot 08/24/2021: The left first metatarsal head wound continues to improve. The wound bed is healthy with just some surrounding callus. Unfortunately the open drain site on his calf remains open and tunnels at least 15 cm (the extent of a Q-tip). This is despite several weeks of wound VAC treatment. Based on reading back through the notes, there has been really no significant change in the depth of the wound, although the orifice is smaller and the more cranial wound on his thigh has closed. I suspect the tunnel tracks nearly all the way to this location. 08/31/2021: Continued improvement in the left first metatarsal head wound. There has been absolutely no improvement to the long tunnel from his open drain site on his calf. We have tried to get him into see vascular surgery sooner to consider the possibility of simply filleting the tract open and allowing it to heal from the bottom up, likely with a wound VAC. They have not yet scheduled a sooner appointment than his current mid April 09/14/2021: He was seen by vascular surgery and they took him to the operating room last week. They opened a portion of the tunnel, but did not extend the entire length of the known open subcutaneous tract. I read Dr. Darcella Cheshire operative note and it is not clear from that documentation why only a portion of the tract was opened. The heaped up granulation tissue was curetted and removed from at least some portion of the tract. They did place a wound VAC and applied an Unna boot to the leg. The ulcer on his left first metatarsal head is smaller  today. The bed looks good and there is just a small amount of surrounding callus. 09/21/2021: The ulcer on his left first metatarsal head looks to be stalled. There is some callus surrounding the wound but the wound bed itself does not appear particularly dynamic. The tunnel tract on his lateral left leg seems to be roughly the same length or perhaps slightly smaller but the wound bed appears healthy with good granulation tissue. He opened up a new wound on his medial thigh and the site of a prior surgical incision. He says that he did this unconsciously in his sleep by scratching. 09/28/2021: Unfortunately, the ulcer on his left first metatarsal head has extended underneath the callus toward the dorsum of his foot. The medial thigh wounds YAMEEN, BROBERG ([id number]) [id number] Page 14 of 21 are roughly the same. The tunnel on his lateral left leg continues to be problematic; it is longer than we are able to actually probe with a Q-tip. I am still not certain as to why Dr. Randie Heinz did not open this up entirely when he took the patient to the operating room. We will likely be back in the  same situation with just a small superficial opening in a long unhealed tract, as the open portion is granulating in nicely. 10/02/2021: The patient was initially scheduled for a nurse visit, but we are also applying a total contact cast today. The plantar foot wound looks clean without significant accumulated callus. We have been applying Prisma silver collagen to the site. 10/05/2021: The patient is here for his first total contact cast change. We have tried using gauze packing strips in the tunnel on his lateral leg wound, but this does not seem to be working any better than the white VAC foam. The foot ulcer looks about the same with minimal periwound callus. Medial thigh wound is clean with just some overlying eschar. 10/12/2021: The plantar foot wound is stable without any significant  accumulation of periwound callus. The surface is viable with good granulation tissue. The medial thigh wounds are much smaller and are epithelializing. On the other hand, he had purulent drainage coming from the tunnel on his lateral leg. He does go back to see Dr. Randie Heinz next week and is planning to ask him why the wound tunnel was not completely opened at the time of his most recent operation. 10/19/2021: The plantar foot wound is markedly improved and has epithelial tissue coming through the surface. The medial thigh wounds are nearly closed with just a tiny open area. He did see Dr. Randie Heinz earlier this week and apparently they did discuss the possibility of opening the sinus tract further and enabling a wound VAC application. Apparently there are some limits as to what Dr. 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 I think if we could get the tract open to the level of the popliteal fossa, this would greatly aid in her ability to get this chart closed. That being said, however, today when I probed the tract with a Q-tip, I was not able to insert the entirety of the Q-tip as I have on previous occasions. The tunnel is shorter by about 4 cm. The surface is clean with good granulation tissue and no further episodes of purulent drainage. 10/30/2021: Last week, the patient underwent surgery and had the long tract in his leg opened. There was a rind that was debrided, according to the operative report. His medial thigh ulcers are closed. The plantar foot wound is clean with a good surface and some built up surrounding callus. 11/06/2021: The overall dimensions of the large wound on his lateral leg remain about the same, but there is good granulation tissue present and the tunneling is a little bit shorter. He has a new wound on his anterior tibial surface, in the same location where he had a similar lesion in the past. The plantar foot wound is clean with some buildup  surrounding callus. Just toward the medial aspect of his foot, however, there is an area of darkening that once debrided, revealed another opening in the skin surface. 11/13/2021: The anterior tibial surface wound is closed. The plantar foot wound has some surrounding callus buildup. The area of darkening that I debrided last week and revealed an opening in the skin surface has closed again. The tunnel in the large wound on his lateral leg has come in by about 3 cm. There is healthy granulation tissue on the entire wound surface. 11/23/2021: The patient was out of town last week and did wet-to-dry dressings on his large wound. He says that he rented an Armed forces logistics/support/administrative officer and was able to avoid walking for much  of his vacation. Unfortunately, he picked open the wound on his left medial thigh. He says that it was itching and he just could not stop scratching it until it was open again. The wound on his plantar foot is smaller and has not accumulated a tremendous amount of callus. The lateral leg wound is shallower and the tunnel has also decreased in depth. There is just a little bit of slough accumulation on the surface. 11/30/2021: Another portion of his left medial thigh has been opened up. All of these wounds are fairly superficial with just a little bit of slough and eschar accumulation. The wound on his plantar foot is almost closed with just a bit of eschar and periwound callus accumulation. The lateral leg wound is nearly flush with the surrounding skin and the tunnel is markedly shallower. 12/07/2021: There is just 1 open area on his left medial thigh. It is clean with just a little bit of perimeter eschar. The wound on his plantar foot continues to contract and just has some eschar and periwound callus accumulation. The lateral leg wound is closing at the more distal aspect and the tunnel is smaller. The surface is nearly flush with the surrounding skin and it has a good bed of granulation  tissue. 12/14/2021: The thigh and foot wounds are closed. The lateral leg wound has closed over approximately half of its length. The tunnel continues to contract and the surface is now flush with the surrounding skin. The wound bed has robust granulation tissue. 12/22/2021: The thigh and foot wounds have reopened. The foot wound has a lot of callus accumulation around and over it. The thigh wound is tiny with just a little bit of slough in the wound bed. The lateral leg wound continues to contract. His vascular surgeon took the wound VAC off earlier in the week and the patient has been doing wet-to-dry dressings. There is a little slough accumulation on the surface. The tunnel is about 3 cm in depth at this point. 12/28/2021: The thigh wound is closed again. The foot wound has some callus that subsequently has peeled back exposing just a small slit of a wound. The lateral leg wound Is down to about half the size that it originally was and the tunnel is down to about half a centimeter in depth. 01/04/2022: The thigh wound remains closed. The foot wound has heavy callus overlying the wound site. Once this was debrided, the wound was found to be closed. The lateral leg wound is smaller again this week and very superficial. No tunnel could be identified. 01/12/2022: The thigh and foot wounds both remain closed. The lateral leg wound is now nearly flush with the skin surface. There is good granulation tissue present with a light layer of slough. 01/19/2022: Due to the way his wrap was placed, the patient did not change the dressing on his thigh at all and so the foam was saturated and his skin is macerated. There is a light layer of slough on the wound surface. The underlying granulation tissue is robust and healthy-appearing. He has heavy callus buildup at the site of his first metatarsal head wound which is still healed. 02/01/2022: He has been in silver alginate. When he removed the dressing from his thigh  wound, however, some leg, superficially reopening a portion of the wound that had healed. In addition, underneath the callus at his left first metatarsal head, there appears to be a blister and the wound appears to be open again. 02/08/2022: The lateral  leg wound has contracted substantially. There is eschar and a light layer of slough present. He says that it is starting to pull and is uncomfortable. On inspection, there is some puckering of the scar and the eschar is quite dry; this may account for his symptoms. On his first metatarsal head, the wound is much smaller with just some eschar on the surface. The callus has not reaccumulated. He reports that he had a blister come up on his medial thigh wound at the distal aspect. It popped and there is now an opening in his skin again. Looking back through his library of wound photos, there is what looks like a permanent suture just deep to this location and it may be trying to erode through. We have been using silver alginate on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

## 2023-03-07 NOTE — Progress Notes (Signed)
[name], [name] ([id number]) [id number] Page 1 of 9 Visit Report for 03/07/2023 Arrival Information Details Patient Name: Date of Service: HIEU, RIEKEN 03/07/2023 10:00 A M Medical Record Number: [id number] Patient Account Number: [id number] Date of Birth/Sex: Treating RN: [birth year] (72 y.o. [name] Primary Care Anyi Fels: [name] Other Clinician: Referring Eular Panek: Treating Donalyn Schneeberger/Extender: [name] in Treatment: 72 Visit Information History Since Last Visit Added or deleted any medications: No Patient Arrived: Ambulatory Any new allergies or adverse reactions: No Arrival Time: 10:05 Had a fall or experienced change in No Accompanied By: self activities of daily living that may affect Transfer Assistance: None risk of falls: Patient Identification Verified: Yes Signs or symptoms of abuse/neglect since last visito No Secondary Verification Process Completed: Yes Hospitalized since last visit: No Patient Requires Transmission-Based Precautions: No Implantable device outside of the clinic excluding No Patient Has Alerts: Yes cellular tissue based products placed in the center since last visit: Has Dressing in Place as Prescribed: Yes Has Compression in Place as Prescribed: Yes Pain Present Now: No Electronic Signature(s) Signed: 03/07/2023 3:42:42 PM By: [name] Entered By: [name] on 03/07/2023 10:06:15 -------------------------------------------------------------------------------- Compression Therapy Details Patient Name: Date of Service: [name]. 03/07/2023 10:00 A M Medical Record Number: [id number] Patient Account Number: [id number] Date of Birth/Sex: Treating RN: July 20, 1950 (72 y.o. [name] Primary Care Joanthony [name]: [name] Other Clinician: Referring [name]: Treating Ennio Houp/Extender: [name] in Treatment:  56 Compression Therapy Performed for Wound Assessment: Wound #18R Left,Plantar Metatarsal head first Performed By: Clinician [name], RN Compression Type: Double Layer Post Procedure Diagnosis Same as Pre-procedure Electronic Signature(s) Signed: 03/07/2023 3:42:42 PM By: Gelene Mink By: [name] on 03/07/2023 10:21:23 [name] ([id number]) [id number] Page 2 of 9 -------------------------------------------------------------------------------- Encounter Discharge Information Details Patient Name: Date of Service: [name], [name] 03/07/2023 10:00 A M Medical Record Number: [id number] Patient Account Number: [id number] Date of Birth/Sex: Treating RN: 11-26-1950 (73 y.o. [name] Primary Care Laiba Fuerte: [name] Other Clinician: Referring Kanaan Kagawa: Treating [name]/Extender: [name] in Treatment: 24 Encounter Discharge Information Items Post Procedure Vitals Discharge Condition: Stable Temperature (F): 98.8 Ambulatory Status: Ambulatory Pulse (bpm): 74 Discharge Destination: Home Respiratory Rate (breaths/min): 18 Transportation: Private Auto Blood Pressure (mmHg): 155/82 Accompanied By: self Schedule Follow-up Appointment: Yes Clinical Summary of Care: Patient Declined Electronic Signature(s) Signed: 03/07/2023 3:42:42 PM By: [name] Entered By: [name] on 03/07/2023 11:05:15 -------------------------------------------------------------------------------- Lower Extremity Assessment Details Patient Name: Date of Service: [name], [name] 03/07/2023 10:00 A M Medical Record Number: [id number] Patient Account Number: [id number] Date of Birth/Sex: Treating RN: [birth year] (72 y.o. [name] Primary Care Edman Lipsey: [name] Other Clinician: Referring Malaney Mcbean: Treating [name]/Extender: [name] in Treatment:  99 Edema Assessment Assessed: [Left: No] [Right: No] Edema: [Left: Ye] [Right: s] Calf Left: Right: Point of Measurement: 41 cm From Medial Instep 40.5 cm Ankle Left: Right: Point of Measurement: 10 cm From Medial Instep 26 cm Vascular Assessment Pulses: Dorsalis Pedis Palpable: [Left:Yes] Extremity colors, hair growth, and conditions: Extremity Color: [Left:Hyperpigmented] Hair Growth on Extremity: [Left:No] Temperature of Extremity: [Left:Warm] Capillary Refill: [Left:< 3 seconds] Dependent Rubor: [Left:No No] Electronic Signature(s) Signed: 03/07/2023 3:42:42 PM By: [name] Entered By: [name] on 03/07/2023 10:12:35 [name] ([id number]) [id number] Page 3 of 9 -------------------------------------------------------------------------------- Multi Wound Chart Details Patient Name: Date of Service: [name], [name]. 03/07/2023 10:00 A M  Medical Record Number: [id number] Patient Account Number: [id number] Date of Birth/Sex: Treating RN: July 22, 1950 (72 y.o. M) Primary Care Oluwatobi Visser: [name] Other Clinician: Referring Janai Maudlin: Treating Idrissa Beville/Extender: [name] in Treatment: 99 Vital Signs Height(in): 74 Pulse(bpm): 74 Weight(lbs): 238 Blood Pressure(mmHg): 155/82 Body Mass Index(BMI): 30.6 Temperature(F): 98.8 Respiratory Rate(breaths/min): 18 [18R:Photos:] [N/A:N/A] Left, Plantar Metatarsal head first Left, Proximal, Lateral Lower Leg N/A Wound Location: Gradually Appeared Bump N/A Wounding Event: Diabetic Wound/Ulcer of the Lower Cyst N/A Primary Etiology: Extremity Glaucoma, Sleep Apnea, Glaucoma, Sleep Apnea, N/A Comorbid History: Hypertension, Peripheral Arterial Hypertension, Peripheral Arterial Disease, Peripheral Venous Disease,Disease, Peripheral Venous Disease, Type II Diabetes, Gout, Osteoarthritis, Type II Diabetes, Gout, Osteoarthritis, Neuropathy  Neuropathy 08/23/2020 06/03/2021 N/A Date Acquired: 60 91 N/A Weeks of Treatment: Open Open N/A Wound Status: Yes Yes N/A Wound Recurrence: No Yes N/A Clustered Wound: 1.3x2x0.2 0.1x0.1x0.1 N/A Measurements L x W x D (cm) 2.042 0.008 N/A A (cm) : rea 0.408 0.001 N/A Volume (cm) : 85.60% 99.50% N/A % Reduction in A rea: 85.60% 99.90% N/A % Reduction in Volume: Grade 2 Full Thickness With Exposed Support N/A Classification: Structures Medium None Present N/A Exudate A mount: Serosanguineous N/A N/A Exudate Type: red, brown N/A N/A Exudate Color: Epibole Distinct, outline attached N/A Wound Margin: Large (67-100%) Large (67-100%) N/A Granulation A mount: Red Red N/A Granulation Quality: Small (1-33%) None Present (0%) N/A Necrotic A mount: Fat Layer (Subcutaneous Tissue): Yes Fascia: No N/A Exposed Structures: Fascia: No Fat Layer (Subcutaneous Tissue): No Tendon: No Tendon: No Muscle: No Muscle: No Joint: No Joint: No Bone: No Bone: No Limited to Skin Breakdown Small (1-33%) Large (67-100%) N/A Epithelialization: Debridement - Selective/Open Wound N/A N/A Debridement: Pre-procedure Verification/Time Out 10:22 N/A N/A Taken: Lidocaine 4% T opical Solution N/A N/A Pain Control: Callus, Slough N/A N/A Tissue Debrided: Skin/Epidermis N/A N/A Level: 2.04 N/A N/A Debridement A (sq cm): rea Curette N/A N/A Instrument: Minimum N/A N/A Bleeding: Pressure N/A N/A Hemostasis A chieved: Procedure was tolerated well N/A N/A Debridement Treatment Response: 1.3x2x0.2 N/A N/A Post Debridement Measurements L x YONY, PINYAN ([id number]) [id number] Page 4 of 9 W x D (cm) 0.408 N/A N/A Post Debridement Volume: (cm) Callus: Yes Scarring: Yes N/A Periwound Skin Texture: Dry/Scaly: Yes Dry/Scaly: Yes N/A Periwound Skin Moisture: No Abnormalities Noted No Abnormalities Noted N/A Periwound Skin Color: No Abnormality No  Abnormality N/A Temperature: Compression Therapy N/A N/A Procedures Performed: Debridement Treatment Notes Electronic Signature(s) Signed: 03/07/2023 10:41:54 AM By: [name] MD FACS Entered By: [name] on 03/07/2023 10:41:54 -------------------------------------------------------------------------------- Multi-Disciplinary Care Plan Details Patient Name: Date of Service: [name]. 03/07/2023 10:00 A M Medical Record Number: [id number] Patient Account Number: [id number] Date of Birth/Sex: Treating RN: December 30, 1950 (72 y.o. [name] Primary Care Arie Powell: [name] Other Clinician: Referring Lebron Nauert: Treating Shawndale Kilpatrick/Extender: [name] in Treatment: 99 Multidisciplinary Care Plan reviewed with physician Active Inactive Venous Leg Ulcer Nursing Diagnoses: Knowledge deficit related to disease process and management Potential for venous Insuffiency (use before diagnosis confirmed) Goals: Patient will maintain optimal edema control Date Initiated: 07/27/2021 Target Resolution Date: 03/22/2023 Goal Status: Active Interventions: Assess peripheral edema status every visit. Treatment Activities: Therapeutic compression applied : 07/27/2021 Notes: Wound/Skin Impairment Nursing Diagnoses: Impaired tissue integrity Knowledge deficit related to ulceration/compromised skin integrity Goals: Patient will have a decrease in wound volume by X% from date: (specify in notes) Date Initiated: 04/12/2021 Date Inactivated: 01/04/2022 Target Resolution Date: 04/23/2021 Goal Status: Met Patient/caregiver will  verbalize understanding of skin care regimen Date Initiated: 01/04/2022 Target Resolution Date: 03/22/2023 Goal Status: Active Ulcer/skin breakdown will have a volume reduction of 30% by week 4 Date Initiated: 04/12/2021 Date Inactivated: 04/27/2021 Target Resolution Date: 04/27/2021 Goal Status: Unmet Unmet Reason:  infection Ulcer/skin breakdown will have a volume reduction of 50% by week 8 Date Initiated: 04/27/2021 Date Inactivated: 06/29/2021 Target Resolution Date: 06/24/2021 Goal Status: Met BRYSEN, MEDICA ([id number]) [id number] Page 5 of 9 Interventions: Assess patient/caregiver ability to obtain necessary supplies Assess patient/caregiver ability to perform ulcer/skin care regimen upon admission and as needed Assess ulceration(s) every visit Notes: Electronic Signature(s) Signed: 03/07/2023 3:42:42 PM By: [name] Entered By: [name] on 03/07/2023 10:18:34 -------------------------------------------------------------------------------- Pain Assessment Details Patient Name: Date of Service: [name]. 03/07/2023 10:00 A M Medical Record Number: [id number] Patient Account Number: [id number] Date of Birth/Sex: Treating RN: 02-18-1951 (72 y.o. [name] Primary Care Debbora Ang: [name] Other Clinician: Referring Brax Walen: Treating Trelon Plush/Extender: [name] in Treatment: 99 Active Problems Location of Pain Severity and Description of Pain Patient Has Paino No Site Locations Rate the pain. Current Pain Level: 0 Pain Management and Medication Current Pain Management: Electronic Signature(s) Signed: 03/07/2023 3:42:42 PM By: [name] Entered By: [name] on 03/07/2023 10:06:40 -------------------------------------------------------------------------------- Patient/Caregiver Education Details Patient Name: Date of Service: [name], [name] 9/12/2024andnbsp10:00 A M Medical Record Number: [id number] Patient Account Number: [id number] Date of Birth/Gender: Treating RN: March 12, 1951 (72 y.o. [name] Primary Care Physician: [name] Other Clinician: [name] ([id number]) [id number] Page 6 of 9 Referring Physician: Treating  Physician/Extender: [name] in Treatment: 99 Education Assessment Education Provided To: Patient Education Topics Provided Wound/Skin Impairment: Methods: Explain/Verbal Responses: Reinforcements needed, State content correctly Electronic Signature(s) Signed: 03/07/2023 3:42:42 PM By: [name] Entered By: [name] on 03/07/2023 10:24:44 -------------------------------------------------------------------------------- Wound Assessment Details Patient Name: Date of Service: [name]. 03/07/2023 10:00 A M Medical Record Number: [id number] Patient Account Number: [id number] Date of Birth/Sex: Treating RN: Mar 03, 1951 (72 y.o. [name] Primary Care Kalab Camps: [name] Other Clinician: Referring Kaavya Puskarich: Treating Jaiden Wahab/Extender: [name] in Treatment: 99 Wound Status Wound Number: 18R Primary Diabetic Wound/Ulcer of the Lower Extremity Etiology: Wound Location: Left, Plantar Metatarsal head first Wound Open Wounding Event: Gradually Appeared Status: Date Acquired: 08/23/2020 Comorbid Glaucoma, Sleep Apnea, Hypertension, Peripheral Arterial Disease, Weeks Of Treatment: 99 History: Peripheral Venous Disease, Type II Diabetes, Gout, Osteoarthritis, Clustered Wound: No Neuropathy Photos Wound Measurements Length: (cm) 1.3 Width: (cm) 2 Depth: (cm) 0.2 Area: (cm) 2.042 Volume: (cm) 0.408 % Reduction in Area: 85.6% % Reduction in Volume: 85.6% Epithelialization: Small (1-33%) Tunneling: No Undermining: No Wound Description Classification: Grade 2 Wound Margin: Epibole Exudate Amount: Medium Exudate Type: Serosanguineous Exudate Color: red, brown [name], [name] ([id number]) Wound Bed Granulation Amount: Large (67-100%) Granulation Quality: Red Necrotic Amount: Small (1-33%) Necrotic Quality: Adherent Slough Foul Odor After Cleansing: No Slough/Fibrino  Yes [phone].pdf Page 7 of 9 Exposed Structure Fascia Exposed: No Fat Layer (Subcutaneous Tissue) Exposed: Yes Tendon Exposed: No Muscle Exposed: No Joint Exposed: No Bone Exposed: No Periwound Skin Texture Texture Color No Abnormalities Noted: No No Abnormalities Noted: Yes Callus: Yes Temperature / Pain Temperature: No Abnormality Moisture No Abnormalities Noted: No Dry / Scaly: Yes Treatment Notes Wound #18R (Metatarsal head first) Wound Laterality: Plantar, Left Cleanser Soap and Water Discharge Instruction: May shower and wash wound with dial antibacterial soap and water prior  to dressing change. Wound Cleanser Discharge Instruction: Cleanse the wound with wound cleanser prior to applying a clean dressing using gauze sponges, not tissue or cotton balls. Peri-Wound Care Ketoconazole Cream 2% Discharge Instruction: Apply Ketoconazole as directed Triamcinolone 15 (g) Discharge Instruction: Use triamcinolone 15 (g) as directed Topical Gentamicin Discharge Instruction: As directed by physician Mupirocin Ointment Discharge Instruction: Apply Mupirocin (Bactroban) as instructed Primary Dressing Maxorb Extra Ag+ Alginate Dressing, 2x2 (in/in) Discharge Instruction: Apply to wound bed as instructed Secondary Dressing Optifoam Non-Adhesive Dressing, 4x4 in Discharge Instruction: Apply over primary dressing as directed. Woven Gauze Sponges 2x2 in Discharge Instruction: Apply over primary dressing as directed. Zetuvit Absorbent Pad, 4x4 (in/in) Secured With Compression Wrap Urgo K2, (equivalent to a 4 layer) two layer compression system, regular Discharge Instruction: Apply Urgo K2 as directed (alternative to 4 layer compression). Compression Stockings Add-Ons Electronic Signature(s) Signed: 03/07/2023 3:42:42 PM By: [name] Entered By: [name] on 03/07/2023 10:15:42 [name] ([id number])  [id number] Page 8 of 9 -------------------------------------------------------------------------------- Wound Assessment Details Patient Name: Date of Service: [name], [name] 03/07/2023 10:00 A M Medical Record Number: [id number] Patient Account Number: [id number] Date of Birth/Sex: Treating RN: [birth year] (72 y.o. [name] Primary Care Terri Malerba: [name] Other Clinician: Referring Jester Klingberg: Treating Mahlia Fernando/Extender: [name] in Treatment: 99 Wound Status Wound Number: 22R Primary Cyst Etiology: Wound Location: Left, Proximal, Lateral Lower Leg Wound Open Wounding Event: Bump Status: Date Acquired: 06/03/2021 Comorbid Glaucoma, Sleep Apnea, Hypertension, Peripheral Arterial Disease, Weeks Of Treatment: 91 History: Peripheral Venous Disease, Type II Diabetes, Gout, Osteoarthritis, Clustered Wound: Yes Neuropathy Photos Wound Measurements Length: (cm) 0.1 Width: (cm) 0.1 Depth: (cm) 0.1 Area: (cm) 0.008 Volume: (cm) 0.001 % Reduction in Area: 99.5% % Reduction in Volume: 99.9% Epithelialization: Large (67-100%) Tunneling: No Undermining: No Wound Description Classification: Full Thickness With Exposed Support Structures Wound Margin: Distinct, outline attached Exudate Amount: None Present Foul Odor After Cleansing: No Slough/Fibrino No Wound Bed Granulation Amount: Large (67-100%) Exposed Structure Granulation Quality: Red Fascia Exposed: No Necrotic Amount: None Present (0%) Fat Layer (Subcutaneous Tissue) Exposed: No Tendon Exposed: No Muscle Exposed: No Joint Exposed: No Bone Exposed: No Limited to Skin Breakdown Periwound Skin Texture Texture Color No Abnormalities Noted: No No Abnormalities Noted: Yes Scarring: Yes Temperature / Pain Temperature: No Abnormality Moisture No Abnormalities Noted: No Dry / Scaly: Yes Treatment Notes Wound #22R (Lower Leg) Wound Laterality: Left,  Lateral, Proximal Cleanser Soap and Water Discharge Instruction: May shower and wash wound with dial antibacterial soap and water prior to dressing change. Wound Cleanser Discharge Instruction: Cleanse the wound with wound cleanser prior to applying a clean dressing using gauze sponges, not tissue or cotton balls. Peri-Wound Care STEPFON, MEYEROWITZ ([id number]) [id number] Page 9 of 9 Topical Primary Dressing Endoform 2x2 in Discharge Instruction: Moisten with saline Secondary Dressing Woven Gauze Sponge, Non-Sterile 4x4 in Discharge Instruction: Apply over primary dressing as directed. Secured With Elastic Bandage 4 inch (ACE bandage) Discharge Instruction: Secure with ACE bandage as directed. Kerlix Roll Sterile, 4.5x3.1 (in/yd) Discharge Instruction: Secure with Kerlix as directed. Compression Wrap Compression Stockings Add-Ons Electronic Signature(s) Signed: 03/07/2023 3:42:42 PM By: [name] Entered By: [name] on 03/07/2023 10:24:07 -------------------------------------------------------------------------------- Vitals Details Patient Name: Date of Service: [name]. 03/07/2023 10:00 A M Medical Record Number: [id number] Patient Account Number: [id number] Date of Birth/Sex: Treating RN: Nov 01, 1950 (72 y.o. [name] Primary Care Iman Reinertsen: [name] Other Clinician: Referring Vaudie Engebretsen: Treating Sumner Kirchman/Extender: [name]  [name] Weeks in Treatment: 99 Vital Signs Time Taken: 10:06 Temperature (F): 98.8 Height (in): 74 Pulse (bpm): 74 Weight (lbs): 238 Respiratory Rate (breaths/min): 18 Body Mass Index (BMI): 30.6 Blood Pressure (mmHg): 155/82 Reference Range: 80 - 120 mg / dl Electronic Signature(s) Signed: 03/07/2023 3:42:42 PM By: [name] Entered By: [name] on 03/07/2023 10:06:32

## 2023-03-14 ENCOUNTER — Encounter (HOSPITAL_BASED_OUTPATIENT_CLINIC_OR_DEPARTMENT_OTHER): Payer: Medicare Other | Admitting: General Surgery

## 2023-03-14 DIAGNOSIS — E11622 Type 2 diabetes mellitus with other skin ulcer: Secondary | ICD-10-CM | POA: Diagnosis not present

## 2023-03-14 NOTE — Progress Notes (Signed)
M) Primary Care [name]: [name] Other Clinician: Referring [name]: Treating [name]/Extender: [name] in Treatment: 100 Vital Signs Height(in): 74 Capillary Blood Glucose(mg/dl): 829 Weight(lbs): 562 Pulse(bpm): 52 Body Mass Index(BMI): 30.6 Blood Pressure(mmHg): 138/77 Temperature(F): 98.1 Respiratory Rate(breaths/min): 18 [18R:Photos:] [N/A:N/A] Left, Plantar Metatarsal head first Left, Proximal, Lateral Lower Leg N/A Wound Location: Gradually Appeared Bump N/A Wounding Event: Diabetic Wound/Ulcer of the Lower Cyst N/A Primary Etiology: Extremity Glaucoma, Sleep Apnea, Glaucoma, Sleep Apnea, N/A Comorbid History: Hypertension, Peripheral Arterial Hypertension, Peripheral Arterial Disease, Peripheral Venous Disease,Disease, Peripheral Venous Disease, Type II Diabetes, Gout, Osteoarthritis, Type II Diabetes, Gout, Osteoarthritis, Neuropathy Neuropathy 08/23/2020 06/03/2021 N/A Date Acquired: 100 92 N/A Weeks of Treatment: Open Open  N/A Wound Status: Yes Yes N/A Wound Recurrence: No Yes N/A Clustered Wound: 1.2x2x0.2 0x0x0 N/A Measurements L x W x D (cm) 1.885 0 N/A A (cm) : rea 0.377 0 N/A Volume (cm) : 86.70% 100.00% N/A % Reduction in A rea: 86.70% 100.00% N/A % Reduction in Volume: Grade 2 Full Thickness With Exposed Support N/A Classification: Structures Medium None Present N/A Exudate A mount: Serosanguineous N/A N/A Exudate Type: red, brown N/A N/A Exudate Color: Epibole Distinct, outline attached N/A Wound Margin: Large (67-100%) None Present (0%) N/A Granulation A mount: Red N/A N/A Granulation Quality: Small (1-33%) None Present (0%) N/A Necrotic A mount: Fat Layer (Subcutaneous Tissue): Yes Fascia: No N/A Exposed Structures: Fascia: No Fat Layer (Subcutaneous Tissue): No Tendon: No Tendon: No Muscle: No Muscle: No Joint: No Joint: No Bone: No Bone: No Small (1-33%) Large (67-100%) N/A Epithelialization: Debridement - Selective/Open Wound N/A N/A Debridement: Pre-procedure Verification/Time Out 10:15 N/A N/A Taken: Lidocaine 4% Topical Solution N/A N/A Pain Control: Slough N/A N/A Tissue Debrided: Skin/Epidermis N/A N/A Level: 1.88 N/A N/A Debridement A (sq cm): rea Curette N/A N/A Instrument: Minimum N/A N/A Bleeding: Pressure N/A N/A Hemostasis A chieved: Procedure was tolerated well N/A N/A Debridement Treatment Response: 1.2x2x0.1 N/A N/A Post Debridement Measurements L x W x D (cm) [name], [name] ([id number]) [id number] Page 4 of 9 0.188 N/A N/A Post Debridement Volume: (cm) Callus: Yes Scarring: Yes N/A Periwound Skin Texture: Dry/Scaly: Yes Dry/Scaly: Yes N/A Periwound Skin Moisture: No Abnormalities Noted No Abnormalities Noted N/A Periwound Skin Color: No Abnormality No Abnormality N/A Temperature: Debridement N/A N/A Procedures Performed: Treatment Notes Electronic Signature(s) Signed: 03/14/2023 10:31:40 AM By:  [name] MD FACS Entered By: [name] on 03/14/2023 07:31:40 -------------------------------------------------------------------------------- Multi-Disciplinary Care Plan Details Patient Name: Date of Service: [name]. 03/14/2023 10:00 A M Medical Record Number: [id number] Patient Account Number: [id number] Date of Birth/Sex: Treating RN: 1951-06-17 (72 y.o. [name] Primary Care [name]: [name] Other Clinician: Referring [name]: Treating [name]/Extender: [name] in Treatment: 100 Multidisciplinary Care Plan reviewed with physician Active Inactive Venous Leg Ulcer Nursing Diagnoses: Knowledge deficit related to disease process and management Potential for venous Insuffiency (use before diagnosis confirmed) Goals: Patient will maintain optimal edema control Date Initiated: 07/27/2021 Target Resolution Date: 04/26/2023 Goal Status: Active Interventions: Assess peripheral edema status every visit. Treatment Activities: Therapeutic compression applied : 07/27/2021 Notes: Wound/Skin Impairment Nursing Diagnoses: Impaired tissue integrity Knowledge deficit related to ulceration/compromised skin integrity Goals: Patient will have a decrease in wound volume by X% from date: (specify in notes) Date Initiated: 04/12/2021 Date Inactivated: 01/04/2022 Target Resolution Date: 04/23/2021 Goal Status: Met Patient/caregiver will verbalize understanding of skin care regimen Date Initiated: 01/04/2022 Target Resolution Date: 04/26/2023 Goal Status: Active Ulcer/skin breakdown will  M) Primary Care [name]: [name] Other Clinician: Referring [name]: Treating [name]/Extender: [name] in Treatment: 100 Vital Signs Height(in): 74 Capillary Blood Glucose(mg/dl): 829 Weight(lbs): 562 Pulse(bpm): 52 Body Mass Index(BMI): 30.6 Blood Pressure(mmHg): 138/77 Temperature(F): 98.1 Respiratory Rate(breaths/min): 18 [18R:Photos:] [N/A:N/A] Left, Plantar Metatarsal head first Left, Proximal, Lateral Lower Leg N/A Wound Location: Gradually Appeared Bump N/A Wounding Event: Diabetic Wound/Ulcer of the Lower Cyst N/A Primary Etiology: Extremity Glaucoma, Sleep Apnea, Glaucoma, Sleep Apnea, N/A Comorbid History: Hypertension, Peripheral Arterial Hypertension, Peripheral Arterial Disease, Peripheral Venous Disease,Disease, Peripheral Venous Disease, Type II Diabetes, Gout, Osteoarthritis, Type II Diabetes, Gout, Osteoarthritis, Neuropathy Neuropathy 08/23/2020 06/03/2021 N/A Date Acquired: 100 92 N/A Weeks of Treatment: Open Open  N/A Wound Status: Yes Yes N/A Wound Recurrence: No Yes N/A Clustered Wound: 1.2x2x0.2 0x0x0 N/A Measurements L x W x D (cm) 1.885 0 N/A A (cm) : rea 0.377 0 N/A Volume (cm) : 86.70% 100.00% N/A % Reduction in A rea: 86.70% 100.00% N/A % Reduction in Volume: Grade 2 Full Thickness With Exposed Support N/A Classification: Structures Medium None Present N/A Exudate A mount: Serosanguineous N/A N/A Exudate Type: red, brown N/A N/A Exudate Color: Epibole Distinct, outline attached N/A Wound Margin: Large (67-100%) None Present (0%) N/A Granulation A mount: Red N/A N/A Granulation Quality: Small (1-33%) None Present (0%) N/A Necrotic A mount: Fat Layer (Subcutaneous Tissue): Yes Fascia: No N/A Exposed Structures: Fascia: No Fat Layer (Subcutaneous Tissue): No Tendon: No Tendon: No Muscle: No Muscle: No Joint: No Joint: No Bone: No Bone: No Small (1-33%) Large (67-100%) N/A Epithelialization: Debridement - Selective/Open Wound N/A N/A Debridement: Pre-procedure Verification/Time Out 10:15 N/A N/A Taken: Lidocaine 4% Topical Solution N/A N/A Pain Control: Slough N/A N/A Tissue Debrided: Skin/Epidermis N/A N/A Level: 1.88 N/A N/A Debridement A (sq cm): rea Curette N/A N/A Instrument: Minimum N/A N/A Bleeding: Pressure N/A N/A Hemostasis A chieved: Procedure was tolerated well N/A N/A Debridement Treatment Response: 1.2x2x0.1 N/A N/A Post Debridement Measurements L x W x D (cm) [name], [name] ([id number]) [id number] Page 4 of 9 0.188 N/A N/A Post Debridement Volume: (cm) Callus: Yes Scarring: Yes N/A Periwound Skin Texture: Dry/Scaly: Yes Dry/Scaly: Yes N/A Periwound Skin Moisture: No Abnormalities Noted No Abnormalities Noted N/A Periwound Skin Color: No Abnormality No Abnormality N/A Temperature: Debridement N/A N/A Procedures Performed: Treatment Notes Electronic Signature(s) Signed: 03/14/2023 10:31:40 AM By:  [name] MD FACS Entered By: [name] on 03/14/2023 07:31:40 -------------------------------------------------------------------------------- Multi-Disciplinary Care Plan Details Patient Name: Date of Service: [name]. 03/14/2023 10:00 A M Medical Record Number: [id number] Patient Account Number: [id number] Date of Birth/Sex: Treating RN: 1951-06-17 (72 y.o. [name] Primary Care [name]: [name] Other Clinician: Referring [name]: Treating [name]/Extender: [name] in Treatment: 100 Multidisciplinary Care Plan reviewed with physician Active Inactive Venous Leg Ulcer Nursing Diagnoses: Knowledge deficit related to disease process and management Potential for venous Insuffiency (use before diagnosis confirmed) Goals: Patient will maintain optimal edema control Date Initiated: 07/27/2021 Target Resolution Date: 04/26/2023 Goal Status: Active Interventions: Assess peripheral edema status every visit. Treatment Activities: Therapeutic compression applied : 07/27/2021 Notes: Wound/Skin Impairment Nursing Diagnoses: Impaired tissue integrity Knowledge deficit related to ulceration/compromised skin integrity Goals: Patient will have a decrease in wound volume by X% from date: (specify in notes) Date Initiated: 04/12/2021 Date Inactivated: 01/04/2022 Target Resolution Date: 04/23/2021 Goal Status: Met Patient/caregiver will verbalize understanding of skin care regimen Date Initiated: 01/04/2022 Target Resolution Date: 04/26/2023 Goal Status: Active Ulcer/skin breakdown will  have a volume reduction of 30% by week 4 Date Initiated: 04/12/2021 Date Inactivated: 04/27/2021 Target Resolution Date: 04/27/2021 Goal Status: Unmet Unmet Reason: infection Ulcer/skin breakdown will have a volume reduction of 50% by week 8 Date Initiated: 04/27/2021 Date Inactivated: 06/29/2021 Target Resolution Date: 06/24/2021 Goal Status:  Met Interventions: [name], [name] ([id number]) [id number] Page 5 of 9 Assess patient/caregiver ability to obtain necessary supplies Assess patient/caregiver ability to perform ulcer/skin care regimen upon admission and as needed Assess ulceration(s) every visit Notes: Electronic Signature(s) Signed: 03/14/2023 3:46:51 PM By: [name] Entered By: [name] on 03/14/2023 07:09:40 -------------------------------------------------------------------------------- Pain Assessment Details Patient Name: Date of Service: [name]. 03/14/2023 10:00 A M Medical Record Number: [id number] Patient Account Number: [id number] Date of Birth/Sex: Treating RN: 24-Jul-1950 (72 y.o. M) Primary Care Dineen Conradt: [name] Other Clinician: Referring Lizmary Nader: Treating Rozalia Dino/Extender: [name] in Treatment: 100 Active Problems Location of Pain Severity and Description of Pain Patient Has Paino No Site Locations Pain Management and Medication Current Pain Management: Electronic Signature(s) Signed: 03/14/2023 10:30:38 AM By: Dayton Scrape Entered By: Dayton Scrape on 03/14/2023 07:00:49 -------------------------------------------------------------------------------- Patient/Caregiver Education Details Patient Name: Date of Service: [name] 9/19/2024andnbsp10:00 A M Medical Record Number: [id number] Patient Account Number: [id number] Date of Birth/Gender: Treating RN: Apr 25, 1951 (71 y.o. [name] Primary Care Physician: [name] Other Clinician: Referring Physician: Treating Physician/Extender: [name] in Treatment: [address] [place] ([id number]) [id number] Page 6 of 9 Education Assessment Education Provided To: Patient Education Topics Provided Wound/Skin Impairment: Methods: Explain/Verbal Responses: Reinforcements needed, State content  correctly Electronic Signature(s) Signed: 03/14/2023 3:46:51 PM By: [name] Entered By: [name] on 03/14/2023 07:09:54 -------------------------------------------------------------------------------- Wound Assessment Details Patient Name: Date of Service: [name]. 03/14/2023 10:00 A M Medical Record Number: [id number] Patient Account Number: [id number] Date of Birth/Sex: Treating RN: 12-27-1950 (72 y.o. M) Primary Care Christien Frankl: [name] Other Clinician: Referring Stassi Fadely: Treating Breck Hollinger/Extender: [name] in Treatment: 100 Wound Status Wound Number: [id number] Primary Diabetic Wound/Ulcer of the Lower Extremity Etiology: Wound Location: Left, Plantar Metatarsal head first Wound Open Wounding Event: Gradually Appeared Status: Date Acquired: 08/23/2020 Comorbid Glaucoma, Sleep Apnea, Hypertension, Peripheral Arterial Disease, Weeks Of Treatment: 100 History: Peripheral Venous Disease, Type II Diabetes, Gout, Osteoarthritis, Clustered Wound: No Neuropathy Photos Wound Measurements Length: (cm) 1.2 Width: (cm) 2 Depth: (cm) 0.2 Area: (cm) 1.885 Volume: (cm) 0.377 % Reduction in Area: 86.7% % Reduction in Volume: 86.7% Epithelialization: Small (1-33%) Wound Description Classification: Grade 2 Wound Margin: Epibole Exudate Amount: Medium Exudate Type: Serosanguineous Exudate Color: red, brown Foul Odor After Cleansing: No Slough/Fibrino Yes Wound Bed Granulation Amount: Large (67-100%) Exposed [name] ([id number]) [id number] Page 7 of 9 Granulation Quality: Red Fascia Exposed: No Necrotic Amount: Small (1-33%) Fat Layer (Subcutaneous Tissue) Exposed: Yes Necrotic Quality: Adherent Slough Tendon Exposed: No Muscle Exposed: No Joint Exposed: No Bone Exposed: No Periwound Skin Texture Texture Color No Abnormalities Noted: No No Abnormalities Noted: Yes Callus: Yes  Temperature / Pain Temperature: No Abnormality Moisture No Abnormalities Noted: No Dry / Scaly: Yes Treatment Notes Wound #18R (Metatarsal head first) Wound Laterality: Plantar, Left Cleanser Soap and Water Discharge Instruction: May shower and wash wound with dial antibacterial soap and water prior to dressing change. Wound Cleanser Discharge Instruction: Cleanse the wound with wound cleanser prior to applying a clean dressing using gauze sponges, not tissue or cotton balls. Peri-Wound Care Ketoconazole Cream 2% Discharge Instruction:  have a volume reduction of 30% by week 4 Date Initiated: 04/12/2021 Date Inactivated: 04/27/2021 Target Resolution Date: 04/27/2021 Goal Status: Unmet Unmet Reason: infection Ulcer/skin breakdown will have a volume reduction of 50% by week 8 Date Initiated: 04/27/2021 Date Inactivated: 06/29/2021 Target Resolution Date: 06/24/2021 Goal Status:  Met Interventions: [name], [name] ([id number]) [id number] Page 5 of 9 Assess patient/caregiver ability to obtain necessary supplies Assess patient/caregiver ability to perform ulcer/skin care regimen upon admission and as needed Assess ulceration(s) every visit Notes: Electronic Signature(s) Signed: 03/14/2023 3:46:51 PM By: [name] Entered By: [name] on 03/14/2023 07:09:40 -------------------------------------------------------------------------------- Pain Assessment Details Patient Name: Date of Service: [name]. 03/14/2023 10:00 A M Medical Record Number: [id number] Patient Account Number: [id number] Date of Birth/Sex: Treating RN: 24-Jul-1950 (72 y.o. M) Primary Care Dineen Conradt: [name] Other Clinician: Referring Lizmary Nader: Treating Rozalia Dino/Extender: [name] in Treatment: 100 Active Problems Location of Pain Severity and Description of Pain Patient Has Paino No Site Locations Pain Management and Medication Current Pain Management: Electronic Signature(s) Signed: 03/14/2023 10:30:38 AM By: Dayton Scrape Entered By: Dayton Scrape on 03/14/2023 07:00:49 -------------------------------------------------------------------------------- Patient/Caregiver Education Details Patient Name: Date of Service: [name] 9/19/2024andnbsp10:00 A M Medical Record Number: [id number] Patient Account Number: [id number] Date of Birth/Gender: Treating RN: Apr 25, 1951 (71 y.o. [name] Primary Care Physician: [name] Other Clinician: Referring Physician: Treating Physician/Extender: [name] in Treatment: [address] [place] ([id number]) [id number] Page 6 of 9 Education Assessment Education Provided To: Patient Education Topics Provided Wound/Skin Impairment: Methods: Explain/Verbal Responses: Reinforcements needed, State content  correctly Electronic Signature(s) Signed: 03/14/2023 3:46:51 PM By: [name] Entered By: [name] on 03/14/2023 07:09:54 -------------------------------------------------------------------------------- Wound Assessment Details Patient Name: Date of Service: [name]. 03/14/2023 10:00 A M Medical Record Number: [id number] Patient Account Number: [id number] Date of Birth/Sex: Treating RN: 12-27-1950 (72 y.o. M) Primary Care Christien Frankl: [name] Other Clinician: Referring Stassi Fadely: Treating Breck Hollinger/Extender: [name] in Treatment: 100 Wound Status Wound Number: [id number] Primary Diabetic Wound/Ulcer of the Lower Extremity Etiology: Wound Location: Left, Plantar Metatarsal head first Wound Open Wounding Event: Gradually Appeared Status: Date Acquired: 08/23/2020 Comorbid Glaucoma, Sleep Apnea, Hypertension, Peripheral Arterial Disease, Weeks Of Treatment: 100 History: Peripheral Venous Disease, Type II Diabetes, Gout, Osteoarthritis, Clustered Wound: No Neuropathy Photos Wound Measurements Length: (cm) 1.2 Width: (cm) 2 Depth: (cm) 0.2 Area: (cm) 1.885 Volume: (cm) 0.377 % Reduction in Area: 86.7% % Reduction in Volume: 86.7% Epithelialization: Small (1-33%) Wound Description Classification: Grade 2 Wound Margin: Epibole Exudate Amount: Medium Exudate Type: Serosanguineous Exudate Color: red, brown Foul Odor After Cleansing: No Slough/Fibrino Yes Wound Bed Granulation Amount: Large (67-100%) Exposed [name] ([id number]) [id number] Page 7 of 9 Granulation Quality: Red Fascia Exposed: No Necrotic Amount: Small (1-33%) Fat Layer (Subcutaneous Tissue) Exposed: Yes Necrotic Quality: Adherent Slough Tendon Exposed: No Muscle Exposed: No Joint Exposed: No Bone Exposed: No Periwound Skin Texture Texture Color No Abnormalities Noted: No No Abnormalities Noted: Yes Callus: Yes  Temperature / Pain Temperature: No Abnormality Moisture No Abnormalities Noted: No Dry / Scaly: Yes Treatment Notes Wound #18R (Metatarsal head first) Wound Laterality: Plantar, Left Cleanser Soap and Water Discharge Instruction: May shower and wash wound with dial antibacterial soap and water prior to dressing change. Wound Cleanser Discharge Instruction: Cleanse the wound with wound cleanser prior to applying a clean dressing using gauze sponges, not tissue or cotton balls. Peri-Wound Care Ketoconazole Cream 2% Discharge Instruction:

## 2023-03-14 NOTE — Progress Notes (Addendum)
example, large plastic bag) or a cast cover and may then take shower. Edema Control - Lymphedema / SCD / Other Elevate legs to the level of the heart or above for 30 minutes daily and/or when sitting for 3-4 times a day throughout the day. DAILAN, RENTFRO ([id number]) [id number] Page 9 of 21 Avoid standing for long periods of time. Patient to wear own compression stockings every day. - both legs daily Moisturize legs daily. Compression stocking or Garment 20-30 mm/Hg pressure to: Off-Loading Wound #18R Left,Plantar Metatarsal head first Open toe surgical shoe to: - Front off loader Left ft Other: - minimal weight bearing left foot Additional Orders / Instructions Follow Nutritious Diet - vitamin C 500 mg 3 times a day and zinc 30-50 mg a day Wound Treatment Wound #18R - Metatarsal head first Wound Laterality: Plantar, Left Cleanser: Soap and Water 1 x Per Week/30 Days Discharge Instructions: May shower and wash wound with dial antibacterial soap and water prior to dressing change. Cleanser: Wound Cleanser 1 x Per Week/30 Days Discharge Instructions: Cleanse the wound with wound cleanser prior to applying a clean dressing using gauze sponges, not tissue or cotton balls. Peri-Wound Care: Ketoconazole Cream 2% 1 x Per Week/30 Days Discharge Instructions: Apply Ketoconazole as directed Peri-Wound Care: Triamcinolone 15 (g) 1 x Per Week/30 Days Discharge Instructions: Use triamcinolone 15 (g) as directed Topical: Gentamicin 1 x Per Week/30 Days Discharge Instructions: As directed by physician Topical: Mupirocin Ointment 1 x Per Week/30 Days Discharge Instructions: Apply  Mupirocin (Bactroban) as instructed Prim Dressing: Maxorb Extra Ag+ Alginate Dressing, 2x2 (in/in) 1 x Per Week/30 Days ary Discharge Instructions: Apply to wound bed as instructed Secondary Dressing: Optifoam Non-Adhesive Dressing, 4x4 in (Generic) 1 x Per Week/30 Days Discharge Instructions: Apply over primary dressing as directed. Secondary Dressing: Woven Gauze Sponges 2x2 in (Generic) 1 x Per Week/30 Days Discharge Instructions: Apply over primary dressing as directed. Secondary Dressing: Zetuvit Absorbent Pad, 4x4 (in/in) 1 x Per Week/30 Days Compression Wrap: Urgo K2, (equivalent to a 4 layer) two layer compression system, regular 1 x Per Week/30 Days Discharge Instructions: Apply Urgo K2 as directed (alternative to 4 layer compression). Patient Medications llergies: No Known Drug Allergies A Notifications Medication Indication Start End 03/14/2023 lidocaine DOSE topical 4 % cream - cream topical Electronic Signature(s) Signed: 03/14/2023 11:42:07 AM By: [name] MD FACS Entered By: [name] on 03/14/2023 07:36:37 -------------------------------------------------------------------------------- Problem List Details Patient Name: Date of Service: [name]. 03/14/2023 10:00 A M Medical Record Number: [id number] Patient Account Number: [id number] Date of Birth/Sex: Treating RN: 30-Apr-1951 (72 y.o. 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 08/26/2022 No Yes severity I87.322 Chronic venous hypertension (idiopathic) with inflammation of left lower 04/12/2021 No Yes extremity E11.51 Type 2 diabetes mellitus  with diabetic peripheral angiopathy without gangrene 04/12/2021 No Yes I89.0 Lymphedema, not elsewhere classified 04/12/2021 No Yes L85.9 Epidermal thickening, unspecified 08/30/2022 No Yes Inactive Problems ICD-10 Code Description Active Date Inactive Date E11.621 Type 2 diabetes mellitus with foot ulcer 04/12/2021 04/12/2021 E11.42 Type 2 diabetes mellitus with diabetic polyneuropathy 04/12/2021 04/12/2021 L02.416 Cutaneous abscess of left lower limb 06/13/2021 06/13/2021 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 01/24/2023 07/20/2021 Resolved Problems ICD-10 Code  Description Active Date Resolved Date L97.828 Non-pressure chronic ulcer of other part of left lower leg with other specified severity 04/12/2021 04/12/2021 L97.828 Non-pressure chronic ulcer of other part of left lower leg with other specified severity 01/10/2023 06/08/2022 Electronic Signature(s) Signed: 03/14/2023 10:31:32 AM By: [name] MD FACS Entered By: [name] on 03/14/2023 07:31:32 -------------------------------------------------------------------------------- Progress Note Details Patient Name: Date of Service: [name]. 03/14/2023 10:00 A M Medical Record Number: [id number] Patient Account Number: [id number] SENNA, STOTZ ([id number]) [id number] Page 11 of 21 Date of Birth/Sex: Treating RN: February 20, 1951 (72 y.o. M) Primary Care Provider: Other Clinician: [name] Referring Provider: Treating Provider/Extender: [name] in Treatment: 100 Subjective Chief Complaint Information obtained from Patient Left leg and foot ulcers 04/12/2021; patient is here for wounds on his left lower leg and left plantar foot over the first metatarsal head History of Present Illness (HPI) 10/11/17; [name] is a 72 year old man who tells me that in 2015 he slipped down the latter traumatizing his left leg. He developed a  wound in the same spot the area that we are currently looking at. He states this closed over for the most part although he always felt it was somewhat unstable. In 2016 he hit the same area with the door of his car had this reopened. He tells me that this is never really closed although sometimes an inflow it remains open on a constant basis. He has not been using any specific dressing to this except for topical antibiotics the nature of which were not really sure. His primary doctor did send him to see Dr. Jacinto Halim of interventional cardiology. He underwent an angiogram on 08/06/17 and he underwent a PTA and directional atherectomy of the lesser distal SFA and popliteal arteries which resulted in brisk improvement in blood flow. It was noted that he had 2 vessel runoff through the anterior tibial and peroneal. He is also been to see vascular and interventional radiologist. He was not felt to have any significant superficial venous insufficiency. Presumably is not a candidate for any ablation. It was suggested he come here for wound care. The patient is a type II diabetic on insulin. He also has a history of venous insufficiency. ABIs on the left were noncompressible in our clinic 10/21/17; patient we admitted to the clinic last week. 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 We put Iodosorb on him after an aggressive debridement and 3 layer compression. He complained of pain in his ankle and itching with is skin in fact he scratched the area on the medial calf superiorly at the rim of our wraps and he has 2 small open areas in that location today which are new. I changed his primary dressing today to silver collagen. As noted he is already had revascularization and does not have any significant superficial venous insufficiency that would be amenable to ablation 10/28/17; patient admitted to the clinic 2 weeks ago. He has a smaller Wound.  Scratch injury from last week revealed. There is large wound over the tibial area. This is smaller. Granulation looks healthy. No need for debridement. 11/04/17; the wound on the left lateral calf looks better. Improved dimensions. Surface of this looks better. We've been maintaining him and Kerlix Coban wraps. He finds this much more comfortable. Silver collagen dressing 11/11/17; left lateral Wound continues to look healthy be making progress. Using a #5 curet I removed removed nonviable skin  example, large plastic bag) or a cast cover and may then take shower. Edema Control - Lymphedema / SCD / Other Elevate legs to the level of the heart or above for 30 minutes daily and/or when sitting for 3-4 times a day throughout the day. DAILAN, RENTFRO ([id number]) [id number] Page 9 of 21 Avoid standing for long periods of time. Patient to wear own compression stockings every day. - both legs daily Moisturize legs daily. Compression stocking or Garment 20-30 mm/Hg pressure to: Off-Loading Wound #18R Left,Plantar Metatarsal head first Open toe surgical shoe to: - Front off loader Left ft Other: - minimal weight bearing left foot Additional Orders / Instructions Follow Nutritious Diet - vitamin C 500 mg 3 times a day and zinc 30-50 mg a day Wound Treatment Wound #18R - Metatarsal head first Wound Laterality: Plantar, Left Cleanser: Soap and Water 1 x Per Week/30 Days Discharge Instructions: May shower and wash wound with dial antibacterial soap and water prior to dressing change. Cleanser: Wound Cleanser 1 x Per Week/30 Days Discharge Instructions: Cleanse the wound with wound cleanser prior to applying a clean dressing using gauze sponges, not tissue or cotton balls. Peri-Wound Care: Ketoconazole Cream 2% 1 x Per Week/30 Days Discharge Instructions: Apply Ketoconazole as directed Peri-Wound Care: Triamcinolone 15 (g) 1 x Per Week/30 Days Discharge Instructions: Use triamcinolone 15 (g) as directed Topical: Gentamicin 1 x Per Week/30 Days Discharge Instructions: As directed by physician Topical: Mupirocin Ointment 1 x Per Week/30 Days Discharge Instructions: Apply  Mupirocin (Bactroban) as instructed Prim Dressing: Maxorb Extra Ag+ Alginate Dressing, 2x2 (in/in) 1 x Per Week/30 Days ary Discharge Instructions: Apply to wound bed as instructed Secondary Dressing: Optifoam Non-Adhesive Dressing, 4x4 in (Generic) 1 x Per Week/30 Days Discharge Instructions: Apply over primary dressing as directed. Secondary Dressing: Woven Gauze Sponges 2x2 in (Generic) 1 x Per Week/30 Days Discharge Instructions: Apply over primary dressing as directed. Secondary Dressing: Zetuvit Absorbent Pad, 4x4 (in/in) 1 x Per Week/30 Days Compression Wrap: Urgo K2, (equivalent to a 4 layer) two layer compression system, regular 1 x Per Week/30 Days Discharge Instructions: Apply Urgo K2 as directed (alternative to 4 layer compression). Patient Medications llergies: No Known Drug Allergies A Notifications Medication Indication Start End 03/14/2023 lidocaine DOSE topical 4 % cream - cream topical Electronic Signature(s) Signed: 03/14/2023 11:42:07 AM By: [name] MD FACS Entered By: [name] on 03/14/2023 07:36:37 -------------------------------------------------------------------------------- Problem List Details Patient Name: Date of Service: [name]. 03/14/2023 10:00 A M Medical Record Number: [id number] Patient Account Number: [id number] Date of Birth/Sex: Treating RN: 30-Apr-1951 (72 y.o. 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 08/26/2022 No Yes severity I87.322 Chronic venous hypertension (idiopathic) with inflammation of left lower 04/12/2021 No Yes extremity E11.51 Type 2 diabetes mellitus  with diabetic peripheral angiopathy without gangrene 04/12/2021 No Yes I89.0 Lymphedema, not elsewhere classified 04/12/2021 No Yes L85.9 Epidermal thickening, unspecified 08/30/2022 No Yes Inactive Problems ICD-10 Code Description Active Date Inactive Date E11.621 Type 2 diabetes mellitus with foot ulcer 04/12/2021 04/12/2021 E11.42 Type 2 diabetes mellitus with diabetic polyneuropathy 04/12/2021 04/12/2021 L02.416 Cutaneous abscess of left lower limb 06/13/2021 06/13/2021 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 01/24/2023 07/20/2021 Resolved Problems ICD-10 Code  Description Active Date Resolved Date L97.828 Non-pressure chronic ulcer of other part of left lower leg with other specified severity 04/12/2021 04/12/2021 L97.828 Non-pressure chronic ulcer of other part of left lower leg with other specified severity 01/10/2023 06/08/2022 Electronic Signature(s) Signed: 03/14/2023 10:31:32 AM By: [name] MD FACS Entered By: [name] on 03/14/2023 07:31:32 -------------------------------------------------------------------------------- Progress Note Details Patient Name: Date of Service: [name]. 03/14/2023 10:00 A M Medical Record Number: [id number] Patient Account Number: [id number] SENNA, STOTZ ([id number]) [id number] Page 11 of 21 Date of Birth/Sex: Treating RN: February 20, 1951 (72 y.o. M) Primary Care Provider: Other Clinician: [name] Referring Provider: Treating Provider/Extender: [name] in Treatment: 100 Subjective Chief Complaint Information obtained from Patient Left leg and foot ulcers 04/12/2021; patient is here for wounds on his left lower leg and left plantar foot over the first metatarsal head History of Present Illness (HPI) 10/11/17; [name] is a 72 year old man who tells me that in 2015 he slipped down the latter traumatizing his left leg. He developed a  wound in the same spot the area that we are currently looking at. He states this closed over for the most part although he always felt it was somewhat unstable. In 2016 he hit the same area with the door of his car had this reopened. He tells me that this is never really closed although sometimes an inflow it remains open on a constant basis. He has not been using any specific dressing to this except for topical antibiotics the nature of which were not really sure. His primary doctor did send him to see Dr. Jacinto Halim of interventional cardiology. He underwent an angiogram on 08/06/17 and he underwent a PTA and directional atherectomy of the lesser distal SFA and popliteal arteries which resulted in brisk improvement in blood flow. It was noted that he had 2 vessel runoff through the anterior tibial and peroneal. He is also been to see vascular and interventional radiologist. He was not felt to have any significant superficial venous insufficiency. Presumably is not a candidate for any ablation. It was suggested he come here for wound care. The patient is a type II diabetic on insulin. He also has a history of venous insufficiency. ABIs on the left were noncompressible in our clinic 10/21/17; patient we admitted to the clinic last week. 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 We put Iodosorb on him after an aggressive debridement and 3 layer compression. He complained of pain in his ankle and itching with is skin in fact he scratched the area on the medial calf superiorly at the rim of our wraps and he has 2 small open areas in that location today which are new. I changed his primary dressing today to silver collagen. As noted he is already had revascularization and does not have any significant superficial venous insufficiency that would be amenable to ablation 10/28/17; patient admitted to the clinic 2 weeks ago. He has a smaller Wound.  Scratch injury from last week revealed. There is large wound over the tibial area. This is smaller. Granulation looks healthy. No need for debridement. 11/04/17; the wound on the left lateral calf looks better. Improved dimensions. Surface of this looks better. We've been maintaining him and Kerlix Coban wraps. He finds this much more comfortable. Silver collagen dressing 11/11/17; left lateral Wound continues to look healthy be making progress. Using a #5 curet I removed removed nonviable skin  HERVEY, ESCARENO ([id number]) [id number] Page 1 of 21 Visit Report for 03/14/2023 Chief Complaint Document Details Patient Name: Date of Service: PATRIK, GRUDZINSKI 03/14/2023 10:00 A M Medical Record Number: [id number] Patient Account Number: [id number] Date of Birth/Sex: Treating RN: 08/28/1950 (72 y.o. M) Primary Care Provider: [name] Other Clinician: Referring Provider: Treating Provider/Extender: [name] in Treatment: 100 Information Obtained from: Patient Chief Complaint Left leg and foot ulcers 04/12/2021; patient is here for wounds on his left lower leg and left plantar foot over the first metatarsal head Electronic Signature(s) Signed: 03/14/2023 10:33:07 AM By: [name] MD FACS Entered By: [name] on 03/14/2023 07:33:07 -------------------------------------------------------------------------------- Debridement Details Patient Name: Date of Service: [name]. 03/14/2023 10:00 A M Medical Record Number: [id number] Patient Account Number: [id number] Date of Birth/Sex: Treating RN: 17-Nov-1950 (72 y.o. [name] Primary Care Provider: [name] Other Clinician: Referring Provider: Treating Provider/Extender: [name] in Treatment: 100 Debridement Performed for Assessment: Wound #18R Left,Plantar Metatarsal head first Performed By: Physician [name], MD The following information was scribed by: [name] The information was scribed for: [name] Debridement Type: Debridement Severity of Tissue Pre Debridement: Fat layer exposed Level of Consciousness (Pre-procedure): Awake and Alert Pre-procedure Verification/Time Out Yes - 10:15 Taken: Start Time: 10:15 Pain Control: Lidocaine 4% T opical Solution Percent of Wound Bed Debrided: 100% T Area Debrided (cm): otal 1.88 Tissue and other material debrided: Non-Viable, Slough, Skin:  Epidermis, Slough Level: Skin/Epidermis Debridement Description: Selective/Open Wound Instrument: Curette Bleeding: Minimum Hemostasis Achieved: Pressure Response to Treatment: Procedure was tolerated well Level of Consciousness (Post- Awake and Alert procedure): Post Debridement Measurements of Total Wound Length: (cm) 1.2 Width: (cm) 2 Depth: (cm) 0.1 Volume: (cm) 0.188 Character of Wound/Ulcer Post Debridement: Improved [name] ([id number]) [id number] Page 2 of 21 Severity of Tissue Post Debridement: Fat layer exposed Post Procedure Diagnosis Same as Pre-procedure Electronic Signature(s) Signed: 03/14/2023 11:42:07 AM By: [name] MD FACS Signed: 03/14/2023 3:46:51 PM By: [name] Entered By: [name] on 03/14/2023 07:29:10 -------------------------------------------------------------------------------- HPI Details Patient Name: Date of Service: [name]. 03/14/2023 10:00 A M Medical Record Number: [id number] Patient Account Number: [id number] Date of Birth/Sex: Treating RN: [birth year] (72 y.o. M) Primary Care Provider: [name] Other Clinician: Referring Provider: Treating Provider/Extender: [name] in Treatment: 100 History of Present Illness HPI Description: 10/11/17; Mr. Errante is a 72 year old man who tells me that in 2015 he slipped down the latter traumatizing his left leg. He developed a wound in the same spot the area that we are currently looking at. He states this closed over for the most part although he always felt it was somewhat unstable. In 2016 he hit the same area with the door of his car had this reopened. He tells me that this is never really closed although sometimes an inflow it remains open on a constant basis. He has not been using any specific dressing to this except for topical antibiotics the nature of which were not really sure. His primary doctor did  send him to see Dr. Jacinto Halim of interventional cardiology. He underwent an angiogram on 08/06/17 and he underwent a PTA and directional atherectomy of the lesser distal SFA and popliteal arteries which resulted in brisk improvement in blood flow. It was noted that he had 2 vessel runoff through the anterior tibial and peroneal. He is also been to see vascular and interventional radiologist. He was  example, large plastic bag) or a cast cover and may then take shower. Edema Control - Lymphedema / SCD / Other Elevate legs to the level of the heart or above for 30 minutes daily and/or when sitting for 3-4 times a day throughout the day. DAILAN, RENTFRO ([id number]) [id number] Page 9 of 21 Avoid standing for long periods of time. Patient to wear own compression stockings every day. - both legs daily Moisturize legs daily. Compression stocking or Garment 20-30 mm/Hg pressure to: Off-Loading Wound #18R Left,Plantar Metatarsal head first Open toe surgical shoe to: - Front off loader Left ft Other: - minimal weight bearing left foot Additional Orders / Instructions Follow Nutritious Diet - vitamin C 500 mg 3 times a day and zinc 30-50 mg a day Wound Treatment Wound #18R - Metatarsal head first Wound Laterality: Plantar, Left Cleanser: Soap and Water 1 x Per Week/30 Days Discharge Instructions: May shower and wash wound with dial antibacterial soap and water prior to dressing change. Cleanser: Wound Cleanser 1 x Per Week/30 Days Discharge Instructions: Cleanse the wound with wound cleanser prior to applying a clean dressing using gauze sponges, not tissue or cotton balls. Peri-Wound Care: Ketoconazole Cream 2% 1 x Per Week/30 Days Discharge Instructions: Apply Ketoconazole as directed Peri-Wound Care: Triamcinolone 15 (g) 1 x Per Week/30 Days Discharge Instructions: Use triamcinolone 15 (g) as directed Topical: Gentamicin 1 x Per Week/30 Days Discharge Instructions: As directed by physician Topical: Mupirocin Ointment 1 x Per Week/30 Days Discharge Instructions: Apply  Mupirocin (Bactroban) as instructed Prim Dressing: Maxorb Extra Ag+ Alginate Dressing, 2x2 (in/in) 1 x Per Week/30 Days ary Discharge Instructions: Apply to wound bed as instructed Secondary Dressing: Optifoam Non-Adhesive Dressing, 4x4 in (Generic) 1 x Per Week/30 Days Discharge Instructions: Apply over primary dressing as directed. Secondary Dressing: Woven Gauze Sponges 2x2 in (Generic) 1 x Per Week/30 Days Discharge Instructions: Apply over primary dressing as directed. Secondary Dressing: Zetuvit Absorbent Pad, 4x4 (in/in) 1 x Per Week/30 Days Compression Wrap: Urgo K2, (equivalent to a 4 layer) two layer compression system, regular 1 x Per Week/30 Days Discharge Instructions: Apply Urgo K2 as directed (alternative to 4 layer compression). Patient Medications llergies: No Known Drug Allergies A Notifications Medication Indication Start End 03/14/2023 lidocaine DOSE topical 4 % cream - cream topical Electronic Signature(s) Signed: 03/14/2023 11:42:07 AM By: [name] MD FACS Entered By: [name] on 03/14/2023 07:36:37 -------------------------------------------------------------------------------- Problem List Details Patient Name: Date of Service: [name]. 03/14/2023 10:00 A M Medical Record Number: [id number] Patient Account Number: [id number] Date of Birth/Sex: Treating RN: 30-Apr-1951 (72 y.o. 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 08/26/2022 No Yes severity I87.322 Chronic venous hypertension (idiopathic) with inflammation of left lower 04/12/2021 No Yes extremity E11.51 Type 2 diabetes mellitus  with diabetic peripheral angiopathy without gangrene 04/12/2021 No Yes I89.0 Lymphedema, not elsewhere classified 04/12/2021 No Yes L85.9 Epidermal thickening, unspecified 08/30/2022 No Yes Inactive Problems ICD-10 Code Description Active Date Inactive Date E11.621 Type 2 diabetes mellitus with foot ulcer 04/12/2021 04/12/2021 E11.42 Type 2 diabetes mellitus with diabetic polyneuropathy 04/12/2021 04/12/2021 L02.416 Cutaneous abscess of left lower limb 06/13/2021 06/13/2021 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 01/24/2023 07/20/2021 Resolved Problems ICD-10 Code  Description Active Date Resolved Date L97.828 Non-pressure chronic ulcer of other part of left lower leg with other specified severity 04/12/2021 04/12/2021 L97.828 Non-pressure chronic ulcer of other part of left lower leg with other specified severity 01/10/2023 06/08/2022 Electronic Signature(s) Signed: 03/14/2023 10:31:32 AM By: [name] MD FACS Entered By: [name] on 03/14/2023 07:31:32 -------------------------------------------------------------------------------- Progress Note Details Patient Name: Date of Service: [name]. 03/14/2023 10:00 A M Medical Record Number: [id number] Patient Account Number: [id number] SENNA, STOTZ ([id number]) [id number] Page 11 of 21 Date of Birth/Sex: Treating RN: February 20, 1951 (72 y.o. M) Primary Care Provider: Other Clinician: [name] Referring Provider: Treating Provider/Extender: [name] in Treatment: 100 Subjective Chief Complaint Information obtained from Patient Left leg and foot ulcers 04/12/2021; patient is here for wounds on his left lower leg and left plantar foot over the first metatarsal head History of Present Illness (HPI) 10/11/17; [name] is a 72 year old man who tells me that in 2015 he slipped down the latter traumatizing his left leg. He developed a  wound in the same spot the area that we are currently looking at. He states this closed over for the most part although he always felt it was somewhat unstable. In 2016 he hit the same area with the door of his car had this reopened. He tells me that this is never really closed although sometimes an inflow it remains open on a constant basis. He has not been using any specific dressing to this except for topical antibiotics the nature of which were not really sure. His primary doctor did send him to see Dr. Jacinto Halim of interventional cardiology. He underwent an angiogram on 08/06/17 and he underwent a PTA and directional atherectomy of the lesser distal SFA and popliteal arteries which resulted in brisk improvement in blood flow. It was noted that he had 2 vessel runoff through the anterior tibial and peroneal. He is also been to see vascular and interventional radiologist. He was not felt to have any significant superficial venous insufficiency. Presumably is not a candidate for any ablation. It was suggested he come here for wound care. The patient is a type II diabetic on insulin. He also has a history of venous insufficiency. ABIs on the left were noncompressible in our clinic 10/21/17; patient we admitted to the clinic last week. 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 We put Iodosorb on him after an aggressive debridement and 3 layer compression. He complained of pain in his ankle and itching with is skin in fact he scratched the area on the medial calf superiorly at the rim of our wraps and he has 2 small open areas in that location today which are new. I changed his primary dressing today to silver collagen. As noted he is already had revascularization and does not have any significant superficial venous insufficiency that would be amenable to ablation 10/28/17; patient admitted to the clinic 2 weeks ago. He has a smaller Wound.  Scratch injury from last week revealed. There is large wound over the tibial area. This is smaller. Granulation looks healthy. No need for debridement. 11/04/17; the wound on the left lateral calf looks better. Improved dimensions. Surface of this looks better. We've been maintaining him and Kerlix Coban wraps. He finds this much more comfortable. Silver collagen dressing 11/11/17; left lateral Wound continues to look healthy be making progress. Using a #5 curet I removed removed nonviable skin  example, large plastic bag) or a cast cover and may then take shower. Edema Control - Lymphedema / SCD / Other Elevate legs to the level of the heart or above for 30 minutes daily and/or when sitting for 3-4 times a day throughout the day. DAILAN, RENTFRO ([id number]) [id number] Page 9 of 21 Avoid standing for long periods of time. Patient to wear own compression stockings every day. - both legs daily Moisturize legs daily. Compression stocking or Garment 20-30 mm/Hg pressure to: Off-Loading Wound #18R Left,Plantar Metatarsal head first Open toe surgical shoe to: - Front off loader Left ft Other: - minimal weight bearing left foot Additional Orders / Instructions Follow Nutritious Diet - vitamin C 500 mg 3 times a day and zinc 30-50 mg a day Wound Treatment Wound #18R - Metatarsal head first Wound Laterality: Plantar, Left Cleanser: Soap and Water 1 x Per Week/30 Days Discharge Instructions: May shower and wash wound with dial antibacterial soap and water prior to dressing change. Cleanser: Wound Cleanser 1 x Per Week/30 Days Discharge Instructions: Cleanse the wound with wound cleanser prior to applying a clean dressing using gauze sponges, not tissue or cotton balls. Peri-Wound Care: Ketoconazole Cream 2% 1 x Per Week/30 Days Discharge Instructions: Apply Ketoconazole as directed Peri-Wound Care: Triamcinolone 15 (g) 1 x Per Week/30 Days Discharge Instructions: Use triamcinolone 15 (g) as directed Topical: Gentamicin 1 x Per Week/30 Days Discharge Instructions: As directed by physician Topical: Mupirocin Ointment 1 x Per Week/30 Days Discharge Instructions: Apply  Mupirocin (Bactroban) as instructed Prim Dressing: Maxorb Extra Ag+ Alginate Dressing, 2x2 (in/in) 1 x Per Week/30 Days ary Discharge Instructions: Apply to wound bed as instructed Secondary Dressing: Optifoam Non-Adhesive Dressing, 4x4 in (Generic) 1 x Per Week/30 Days Discharge Instructions: Apply over primary dressing as directed. Secondary Dressing: Woven Gauze Sponges 2x2 in (Generic) 1 x Per Week/30 Days Discharge Instructions: Apply over primary dressing as directed. Secondary Dressing: Zetuvit Absorbent Pad, 4x4 (in/in) 1 x Per Week/30 Days Compression Wrap: Urgo K2, (equivalent to a 4 layer) two layer compression system, regular 1 x Per Week/30 Days Discharge Instructions: Apply Urgo K2 as directed (alternative to 4 layer compression). Patient Medications llergies: No Known Drug Allergies A Notifications Medication Indication Start End 03/14/2023 lidocaine DOSE topical 4 % cream - cream topical Electronic Signature(s) Signed: 03/14/2023 11:42:07 AM By: [name] MD FACS Entered By: [name] on 03/14/2023 07:36:37 -------------------------------------------------------------------------------- Problem List Details Patient Name: Date of Service: [name]. 03/14/2023 10:00 A M Medical Record Number: [id number] Patient Account Number: [id number] Date of Birth/Sex: Treating RN: 30-Apr-1951 (72 y.o. 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 08/26/2022 No Yes severity I87.322 Chronic venous hypertension (idiopathic) with inflammation of left lower 04/12/2021 No Yes extremity E11.51 Type 2 diabetes mellitus  with diabetic peripheral angiopathy without gangrene 04/12/2021 No Yes I89.0 Lymphedema, not elsewhere classified 04/12/2021 No Yes L85.9 Epidermal thickening, unspecified 08/30/2022 No Yes Inactive Problems ICD-10 Code Description Active Date Inactive Date E11.621 Type 2 diabetes mellitus with foot ulcer 04/12/2021 04/12/2021 E11.42 Type 2 diabetes mellitus with diabetic polyneuropathy 04/12/2021 04/12/2021 L02.416 Cutaneous abscess of left lower limb 06/13/2021 06/13/2021 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 Edema Control - Lymphedema / SCD / Other Elevate legs to the level of the heart or above for 30 minutes daily and/or when sitting for 3-4 times a day throughout the day. DAILAN, RENTFRO ([id number]) [id number] Page 9 of 21 Avoid standing for long periods of time. Patient to wear own compression stockings every day. - both legs daily Moisturize legs daily. Compression stocking or Garment 20-30 mm/Hg pressure to: Off-Loading Wound #18R Left,Plantar Metatarsal head first Open toe surgical shoe to: - Front off loader Left ft Other: - minimal weight bearing left foot Additional Orders / Instructions Follow Nutritious Diet - vitamin C 500 mg 3 times a day and zinc 30-50 mg a day Wound Treatment Wound #18R - Metatarsal head first Wound Laterality: Plantar, Left Cleanser: Soap and Water 1 x Per Week/30 Days Discharge Instructions: May shower and wash wound with dial antibacterial soap and water prior to dressing change. Cleanser: Wound Cleanser 1 x Per Week/30 Days Discharge Instructions: Cleanse the wound with wound cleanser prior to applying a clean dressing using gauze sponges, not tissue or cotton balls. Peri-Wound Care: Ketoconazole Cream 2% 1 x Per Week/30 Days Discharge Instructions: Apply Ketoconazole as directed Peri-Wound Care: Triamcinolone 15 (g) 1 x Per Week/30 Days Discharge Instructions: Use triamcinolone 15 (g) as directed Topical: Gentamicin 1 x Per Week/30 Days Discharge Instructions: As directed by physician Topical: Mupirocin Ointment 1 x Per Week/30 Days Discharge Instructions: Apply  Mupirocin (Bactroban) as instructed Prim Dressing: Maxorb Extra Ag+ Alginate Dressing, 2x2 (in/in) 1 x Per Week/30 Days ary Discharge Instructions: Apply to wound bed as instructed Secondary Dressing: Optifoam Non-Adhesive Dressing, 4x4 in (Generic) 1 x Per Week/30 Days Discharge Instructions: Apply over primary dressing as directed. Secondary Dressing: Woven Gauze Sponges 2x2 in (Generic) 1 x Per Week/30 Days Discharge Instructions: Apply over primary dressing as directed. Secondary Dressing: Zetuvit Absorbent Pad, 4x4 (in/in) 1 x Per Week/30 Days Compression Wrap: Urgo K2, (equivalent to a 4 layer) two layer compression system, regular 1 x Per Week/30 Days Discharge Instructions: Apply Urgo K2 as directed (alternative to 4 layer compression). Patient Medications llergies: No Known Drug Allergies A Notifications Medication Indication Start End 03/14/2023 lidocaine DOSE topical 4 % cream - cream topical Electronic Signature(s) Signed: 03/14/2023 11:42:07 AM By: [name] MD FACS Entered By: [name] on 03/14/2023 07:36:37 -------------------------------------------------------------------------------- Problem List Details Patient Name: Date of Service: [name]. 03/14/2023 10:00 A M Medical Record Number: [id number] Patient Account Number: [id number] Date of Birth/Sex: Treating RN: 30-Apr-1951 (72 y.o. 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 08/26/2022 No Yes severity I87.322 Chronic venous hypertension (idiopathic) with inflammation of left lower 04/12/2021 No Yes extremity E11.51 Type 2 diabetes mellitus  with diabetic peripheral angiopathy without gangrene 04/12/2021 No Yes I89.0 Lymphedema, not elsewhere classified 04/12/2021 No Yes L85.9 Epidermal thickening, unspecified 08/30/2022 No Yes Inactive Problems ICD-10 Code Description Active Date Inactive Date E11.621 Type 2 diabetes mellitus with foot ulcer 04/12/2021 04/12/2021 E11.42 Type 2 diabetes mellitus with diabetic polyneuropathy 04/12/2021 04/12/2021 L02.416 Cutaneous abscess of left lower limb 06/13/2021 06/13/2021 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 01/24/2023 07/20/2021 Resolved Problems ICD-10 Code  Description Active Date Resolved Date L97.828 Non-pressure chronic ulcer of other part of left lower leg with other specified severity 04/12/2021 04/12/2021 L97.828 Non-pressure chronic ulcer of other part of left lower leg with other specified severity 01/10/2023 06/08/2022 Electronic Signature(s) Signed: 03/14/2023 10:31:32 AM By: [name] MD FACS Entered By: [name] on 03/14/2023 07:31:32 -------------------------------------------------------------------------------- Progress Note Details Patient Name: Date of Service: [name]. 03/14/2023 10:00 A M Medical Record Number: [id number] Patient Account Number: [id number] SENNA, STOTZ ([id number]) [id number] Page 11 of 21 Date of Birth/Sex: Treating RN: February 20, 1951 (72 y.o. M) Primary Care Provider: Other Clinician: [name] Referring Provider: Treating Provider/Extender: [name] in Treatment: 100 Subjective Chief Complaint Information obtained from Patient Left leg and foot ulcers 04/12/2021; patient is here for wounds on his left lower leg and left plantar foot over the first metatarsal head History of Present Illness (HPI) 10/11/17; [name] is a 72 year old man who tells me that in 2015 he slipped down the latter traumatizing his left leg. He developed a  wound in the same spot the area that we are currently looking at. He states this closed over for the most part although he always felt it was somewhat unstable. In 2016 he hit the same area with the door of his car had this reopened. He tells me that this is never really closed although sometimes an inflow it remains open on a constant basis. He has not been using any specific dressing to this except for topical antibiotics the nature of which were not really sure. His primary doctor did send him to see Dr. Jacinto Halim of interventional cardiology. He underwent an angiogram on 08/06/17 and he underwent a PTA and directional atherectomy of the lesser distal SFA and popliteal arteries which resulted in brisk improvement in blood flow. It was noted that he had 2 vessel runoff through the anterior tibial and peroneal. He is also been to see vascular and interventional radiologist. He was not felt to have any significant superficial venous insufficiency. Presumably is not a candidate for any ablation. It was suggested he come here for wound care. The patient is a type II diabetic on insulin. He also has a history of venous insufficiency. ABIs on the left were noncompressible in our clinic 10/21/17; patient we admitted to the clinic last week. 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 We put Iodosorb on him after an aggressive debridement and 3 layer compression. He complained of pain in his ankle and itching with is skin in fact he scratched the area on the medial calf superiorly at the rim of our wraps and he has 2 small open areas in that location today which are new. I changed his primary dressing today to silver collagen. As noted he is already had revascularization and does not have any significant superficial venous insufficiency that would be amenable to ablation 10/28/17; patient admitted to the clinic 2 weeks ago. He has a smaller Wound.  Scratch injury from last week revealed. There is large wound over the tibial area. This is smaller. Granulation looks healthy. No need for debridement. 11/04/17; the wound on the left lateral calf looks better. Improved dimensions. Surface of this looks better. We've been maintaining him and Kerlix Coban wraps. He finds this much more comfortable. Silver collagen dressing 11/11/17; left lateral Wound continues to look healthy be making progress. Using a #5 curet I removed removed nonviable skin  Former smoker - quit 1999; Marital Status - Married; Alcohol Use: Moderate; Drug Use: No History; Caffeine Use: Rarely; Financial Concerns: No; Food, Clothing or Shelter Needs: No; Support System Lacking: No; Transportation Concerns: No Electronic Signature(s) Signed: 03/14/2023 11:42:07 AM By: [name] MD FACS Entered By: [name] on 03/14/2023 07:34:50 -------------------------------------------------------------------------------- SuperBill Details Patient Name: Date of Service: [name]. 03/14/2023 Medical Record  Number: [id number] Patient Account Number: [id number] Date of Birth/Sex: Treating RN: 06/13/1951 (72 y.o. 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 I89.0 Lymphedema, not elsewhere classified L85.9 Epidermal thickening, unspecified Facility Procedures : CPT4 Code: [id number] Description: 97597 - DEBRIDE WOUND 1ST 20 SQ CM OR < ICD-10 Diagnosis Description L97.528 Non-pressure chronic ulcer of other part of left foot with other specified severi Modifier: ty Quantity: 1 Physician Procedures : CPT4 Code Description Modifier [id number] - WC PHYS LEVEL 4 - EST PT 25 ICD-10 Diagnosis Description L97.528 Non-pressure chronic ulcer of other part of left foot with other specified severity I87.322 Chronic venous hypertension (idiopathic) with  inflammation of left lower extremity E11.51 Type 2 diabetes mellitus with diabetic peripheral angiopathy without gangrene I89.0 Lymphedema, not elsewhere classified Quantity: 1 : [id number] 97597 - WC PHYS DEBR WO ANESTH 20 SQ CM ICD-10 Diagnosis Description L97.528 Non-pressure chronic ulcer of other part of left foot with other specified severity Quantity: 1 Electronic Signature(s) Signed: 03/14/2023 10:38:56 AM By: [name] MD FACS Entered By: [name] on 03/14/2023 07:38:55  Description Active Date Resolved Date L97.828 Non-pressure chronic ulcer of other part of left lower leg with other specified severity 04/12/2021 04/12/2021 L97.828 Non-pressure chronic ulcer of other part of left lower leg with other specified severity 01/10/2023 06/08/2022 Electronic Signature(s) Signed: 03/14/2023 10:31:32 AM By: [name] MD FACS Entered By: [name] on 03/14/2023 07:31:32 -------------------------------------------------------------------------------- Progress Note Details Patient Name: Date of Service: [name]. 03/14/2023 10:00 A M Medical Record Number: [id number] Patient Account Number: [id number] SENNA, STOTZ ([id number]) [id number] Page 11 of 21 Date of Birth/Sex: Treating RN: February 20, 1951 (72 y.o. M) Primary Care Provider: Other Clinician: [name] Referring Provider: Treating Provider/Extender: [name] in Treatment: 100 Subjective Chief Complaint Information obtained from Patient Left leg and foot ulcers 04/12/2021; patient is here for wounds on his left lower leg and left plantar foot over the first metatarsal head History of Present Illness (HPI) 10/11/17; [name] is a 72 year old man who tells me that in 2015 he slipped down the latter traumatizing his left leg. He developed a  wound in the same spot the area that we are currently looking at. He states this closed over for the most part although he always felt it was somewhat unstable. In 2016 he hit the same area with the door of his car had this reopened. He tells me that this is never really closed although sometimes an inflow it remains open on a constant basis. He has not been using any specific dressing to this except for topical antibiotics the nature of which were not really sure. His primary doctor did send him to see Dr. Jacinto Halim of interventional cardiology. He underwent an angiogram on 08/06/17 and he underwent a PTA and directional atherectomy of the lesser distal SFA and popliteal arteries which resulted in brisk improvement in blood flow. It was noted that he had 2 vessel runoff through the anterior tibial and peroneal. He is also been to see vascular and interventional radiologist. He was not felt to have any significant superficial venous insufficiency. Presumably is not a candidate for any ablation. It was suggested he come here for wound care. The patient is a type II diabetic on insulin. He also has a history of venous insufficiency. ABIs on the left were noncompressible in our clinic 10/21/17; patient we admitted to the clinic last week. 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 We put Iodosorb on him after an aggressive debridement and 3 layer compression. He complained of pain in his ankle and itching with is skin in fact he scratched the area on the medial calf superiorly at the rim of our wraps and he has 2 small open areas in that location today which are new. I changed his primary dressing today to silver collagen. As noted he is already had revascularization and does not have any significant superficial venous insufficiency that would be amenable to ablation 10/28/17; patient admitted to the clinic 2 weeks ago. He has a smaller Wound.  Scratch injury from last week revealed. There is large wound over the tibial area. This is smaller. Granulation looks healthy. No need for debridement. 11/04/17; the wound on the left lateral calf looks better. Improved dimensions. Surface of this looks better. We've been maintaining him and Kerlix Coban wraps. He finds this much more comfortable. Silver collagen dressing 11/11/17; left lateral Wound continues to look healthy be making progress. Using a #5 curet I removed removed nonviable skin  example, large plastic bag) or a cast cover and may then take shower. Edema Control - Lymphedema / SCD / Other Elevate legs to the level of the heart or above for 30 minutes daily and/or when sitting for 3-4 times a day throughout the day. DAILAN, RENTFRO ([id number]) [id number] Page 9 of 21 Avoid standing for long periods of time. Patient to wear own compression stockings every day. - both legs daily Moisturize legs daily. Compression stocking or Garment 20-30 mm/Hg pressure to: Off-Loading Wound #18R Left,Plantar Metatarsal head first Open toe surgical shoe to: - Front off loader Left ft Other: - minimal weight bearing left foot Additional Orders / Instructions Follow Nutritious Diet - vitamin C 500 mg 3 times a day and zinc 30-50 mg a day Wound Treatment Wound #18R - Metatarsal head first Wound Laterality: Plantar, Left Cleanser: Soap and Water 1 x Per Week/30 Days Discharge Instructions: May shower and wash wound with dial antibacterial soap and water prior to dressing change. Cleanser: Wound Cleanser 1 x Per Week/30 Days Discharge Instructions: Cleanse the wound with wound cleanser prior to applying a clean dressing using gauze sponges, not tissue or cotton balls. Peri-Wound Care: Ketoconazole Cream 2% 1 x Per Week/30 Days Discharge Instructions: Apply Ketoconazole as directed Peri-Wound Care: Triamcinolone 15 (g) 1 x Per Week/30 Days Discharge Instructions: Use triamcinolone 15 (g) as directed Topical: Gentamicin 1 x Per Week/30 Days Discharge Instructions: As directed by physician Topical: Mupirocin Ointment 1 x Per Week/30 Days Discharge Instructions: Apply  Mupirocin (Bactroban) as instructed Prim Dressing: Maxorb Extra Ag+ Alginate Dressing, 2x2 (in/in) 1 x Per Week/30 Days ary Discharge Instructions: Apply to wound bed as instructed Secondary Dressing: Optifoam Non-Adhesive Dressing, 4x4 in (Generic) 1 x Per Week/30 Days Discharge Instructions: Apply over primary dressing as directed. Secondary Dressing: Woven Gauze Sponges 2x2 in (Generic) 1 x Per Week/30 Days Discharge Instructions: Apply over primary dressing as directed. Secondary Dressing: Zetuvit Absorbent Pad, 4x4 (in/in) 1 x Per Week/30 Days Compression Wrap: Urgo K2, (equivalent to a 4 layer) two layer compression system, regular 1 x Per Week/30 Days Discharge Instructions: Apply Urgo K2 as directed (alternative to 4 layer compression). Patient Medications llergies: No Known Drug Allergies A Notifications Medication Indication Start End 03/14/2023 lidocaine DOSE topical 4 % cream - cream topical Electronic Signature(s) Signed: 03/14/2023 11:42:07 AM By: [name] MD FACS Entered By: [name] on 03/14/2023 07:36:37 -------------------------------------------------------------------------------- Problem List Details Patient Name: Date of Service: [name]. 03/14/2023 10:00 A M Medical Record Number: [id number] Patient Account Number: [id number] Date of Birth/Sex: Treating RN: 30-Apr-1951 (72 y.o. 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 08/26/2022 No Yes severity I87.322 Chronic venous hypertension (idiopathic) with inflammation of left lower 04/12/2021 No Yes extremity E11.51 Type 2 diabetes mellitus  with diabetic peripheral angiopathy without gangrene 04/12/2021 No Yes I89.0 Lymphedema, not elsewhere classified 04/12/2021 No Yes L85.9 Epidermal thickening, unspecified 08/30/2022 No Yes Inactive Problems ICD-10 Code Description Active Date Inactive Date E11.621 Type 2 diabetes mellitus with foot ulcer 04/12/2021 04/12/2021 E11.42 Type 2 diabetes mellitus with diabetic polyneuropathy 04/12/2021 04/12/2021 L02.416 Cutaneous abscess of left lower limb 06/13/2021 06/13/2021 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 Edema Control - Lymphedema / SCD / Other Elevate legs to the level of the heart or above for 30 minutes daily and/or when sitting for 3-4 times a day throughout the day. DAILAN, RENTFRO ([id number]) [id number] Page 9 of 21 Avoid standing for long periods of time. Patient to wear own compression stockings every day. - both legs daily Moisturize legs daily. Compression stocking or Garment 20-30 mm/Hg pressure to: Off-Loading Wound #18R Left,Plantar Metatarsal head first Open toe surgical shoe to: - Front off loader Left ft Other: - minimal weight bearing left foot Additional Orders / Instructions Follow Nutritious Diet - vitamin C 500 mg 3 times a day and zinc 30-50 mg a day Wound Treatment Wound #18R - Metatarsal head first Wound Laterality: Plantar, Left Cleanser: Soap and Water 1 x Per Week/30 Days Discharge Instructions: May shower and wash wound with dial antibacterial soap and water prior to dressing change. Cleanser: Wound Cleanser 1 x Per Week/30 Days Discharge Instructions: Cleanse the wound with wound cleanser prior to applying a clean dressing using gauze sponges, not tissue or cotton balls. Peri-Wound Care: Ketoconazole Cream 2% 1 x Per Week/30 Days Discharge Instructions: Apply Ketoconazole as directed Peri-Wound Care: Triamcinolone 15 (g) 1 x Per Week/30 Days Discharge Instructions: Use triamcinolone 15 (g) as directed Topical: Gentamicin 1 x Per Week/30 Days Discharge Instructions: As directed by physician Topical: Mupirocin Ointment 1 x Per Week/30 Days Discharge Instructions: Apply  Mupirocin (Bactroban) as instructed Prim Dressing: Maxorb Extra Ag+ Alginate Dressing, 2x2 (in/in) 1 x Per Week/30 Days ary Discharge Instructions: Apply to wound bed as instructed Secondary Dressing: Optifoam Non-Adhesive Dressing, 4x4 in (Generic) 1 x Per Week/30 Days Discharge Instructions: Apply over primary dressing as directed. Secondary Dressing: Woven Gauze Sponges 2x2 in (Generic) 1 x Per Week/30 Days Discharge Instructions: Apply over primary dressing as directed. Secondary Dressing: Zetuvit Absorbent Pad, 4x4 (in/in) 1 x Per Week/30 Days Compression Wrap: Urgo K2, (equivalent to a 4 layer) two layer compression system, regular 1 x Per Week/30 Days Discharge Instructions: Apply Urgo K2 as directed (alternative to 4 layer compression). Patient Medications llergies: No Known Drug Allergies A Notifications Medication Indication Start End 03/14/2023 lidocaine DOSE topical 4 % cream - cream topical Electronic Signature(s) Signed: 03/14/2023 11:42:07 AM By: [name] MD FACS Entered By: [name] on 03/14/2023 07:36:37 -------------------------------------------------------------------------------- Problem List Details Patient Name: Date of Service: [name]. 03/14/2023 10:00 A M Medical Record Number: [id number] Patient Account Number: [id number] Date of Birth/Sex: Treating RN: 30-Apr-1951 (72 y.o. 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 08/26/2022 No Yes severity I87.322 Chronic venous hypertension (idiopathic) with inflammation of left lower 04/12/2021 No Yes extremity E11.51 Type 2 diabetes mellitus  with diabetic peripheral angiopathy without gangrene 04/12/2021 No Yes I89.0 Lymphedema, not elsewhere classified 04/12/2021 No Yes L85.9 Epidermal thickening, unspecified 08/30/2022 No Yes Inactive Problems ICD-10 Code Description Active Date Inactive Date E11.621 Type 2 diabetes mellitus with foot ulcer 04/12/2021 04/12/2021 E11.42 Type 2 diabetes mellitus with diabetic polyneuropathy 04/12/2021 04/12/2021 L02.416 Cutaneous abscess of left lower limb 06/13/2021 06/13/2021 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 03/14/2023 Medical Record  Number: [id number] Patient Account Number: [id number] Date of Birth/Sex: Treating RN: 06/13/1951 (72 y.o. 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 I89.0 Lymphedema, not elsewhere classified L85.9 Epidermal thickening, unspecified Facility Procedures : CPT4 Code: 72536644 Description: 97597 - DEBRIDE WOUND 1ST 20 SQ CM OR < ICD-10 Diagnosis Description L97.528 Non-pressure chronic ulcer of other part of left foot with other specified severi Modifier: ty Quantity: 1 Physician Procedures : CPT4 Code Description Modifier [id number] 99214 - WC PHYS LEVEL 4 - EST PT 25 ICD-10 Diagnosis Description L97.528 Non-pressure chronic ulcer of other part of left foot with other specified severity I87.322 Chronic venous hypertension (idiopathic) with  inflammation of left lower extremity E11.51 Type 2 diabetes mellitus with diabetic peripheral angiopathy without gangrene I89.0 Lymphedema, not elsewhere classified Quantity: 1 : [id number] 97597 - WC PHYS DEBR WO ANESTH 20 SQ CM ICD-10 Diagnosis Description L97.528 Non-pressure chronic ulcer of other part of left foot with other specified severity Quantity: 1 Electronic Signature(s) Signed: 03/14/2023 10:38:56 AM By: [name] MD FACS Entered By: [name] on 03/14/2023 07:38:55  HERVEY, ESCARENO ([id number]) [id number] Page 1 of 21 Visit Report for 03/14/2023 Chief Complaint Document Details Patient Name: Date of Service: PATRIK, GRUDZINSKI 03/14/2023 10:00 A M Medical Record Number: [id number] Patient Account Number: [id number] Date of Birth/Sex: Treating RN: 08/28/1950 (72 y.o. M) Primary Care Provider: [name] Other Clinician: Referring Provider: Treating Provider/Extender: [name] in Treatment: 100 Information Obtained from: Patient Chief Complaint Left leg and foot ulcers 04/12/2021; patient is here for wounds on his left lower leg and left plantar foot over the first metatarsal head Electronic Signature(s) Signed: 03/14/2023 10:33:07 AM By: [name] MD FACS Entered By: [name] on 03/14/2023 07:33:07 -------------------------------------------------------------------------------- Debridement Details Patient Name: Date of Service: [name]. 03/14/2023 10:00 A M Medical Record Number: [id number] Patient Account Number: [id number] Date of Birth/Sex: Treating RN: 17-Nov-1950 (72 y.o. [name] Primary Care Provider: [name] Other Clinician: Referring Provider: Treating Provider/Extender: [name] in Treatment: 100 Debridement Performed for Assessment: Wound #18R Left,Plantar Metatarsal head first Performed By: Physician [name], MD The following information was scribed by: [name] The information was scribed for: [name] Debridement Type: Debridement Severity of Tissue Pre Debridement: Fat layer exposed Level of Consciousness (Pre-procedure): Awake and Alert Pre-procedure Verification/Time Out Yes - 10:15 Taken: Start Time: 10:15 Pain Control: Lidocaine 4% T opical Solution Percent of Wound Bed Debrided: 100% T Area Debrided (cm): otal 1.88 Tissue and other material debrided: Non-Viable, Slough, Skin:  Epidermis, Slough Level: Skin/Epidermis Debridement Description: Selective/Open Wound Instrument: Curette Bleeding: Minimum Hemostasis Achieved: Pressure Response to Treatment: Procedure was tolerated well Level of Consciousness (Post- Awake and Alert procedure): Post Debridement Measurements of Total Wound Length: (cm) 1.2 Width: (cm) 2 Depth: (cm) 0.1 Volume: (cm) 0.188 Character of Wound/Ulcer Post Debridement: Improved [name] ([id number]) [id number] Page 2 of 21 Severity of Tissue Post Debridement: Fat layer exposed Post Procedure Diagnosis Same as Pre-procedure Electronic Signature(s) Signed: 03/14/2023 11:42:07 AM By: [name] MD FACS Signed: 03/14/2023 3:46:51 PM By: [name] Entered By: [name] on 03/14/2023 07:29:10 -------------------------------------------------------------------------------- HPI Details Patient Name: Date of Service: [name]. 03/14/2023 10:00 A M Medical Record Number: [id number] Patient Account Number: [id number] Date of Birth/Sex: Treating RN: [birth year] (72 y.o. M) Primary Care Provider: [name] Other Clinician: Referring Provider: Treating Provider/Extender: [name] in Treatment: 100 History of Present Illness HPI Description: 10/11/17; Mr. Errante is a 72 year old man who tells me that in 2015 he slipped down the latter traumatizing his left leg. He developed a wound in the same spot the area that we are currently looking at. He states this closed over for the most part although he always felt it was somewhat unstable. In 2016 he hit the same area with the door of his car had this reopened. He tells me that this is never really closed although sometimes an inflow it remains open on a constant basis. He has not been using any specific dressing to this except for topical antibiotics the nature of which were not really sure. His primary doctor did  send him to see Dr. Jacinto Halim of interventional cardiology. He underwent an angiogram on 08/06/17 and he underwent a PTA and directional atherectomy of the lesser distal SFA and popliteal arteries which resulted in brisk improvement in blood flow. It was noted that he had 2 vessel runoff through the anterior tibial and peroneal. He is also been to see vascular and interventional radiologist. He was  Description Active Date Resolved Date L97.828 Non-pressure chronic ulcer of other part of left lower leg with other specified severity 04/12/2021 04/12/2021 L97.828 Non-pressure chronic ulcer of other part of left lower leg with other specified severity 01/10/2023 06/08/2022 Electronic Signature(s) Signed: 03/14/2023 10:31:32 AM By: [name] MD FACS Entered By: [name] on 03/14/2023 07:31:32 -------------------------------------------------------------------------------- Progress Note Details Patient Name: Date of Service: [name]. 03/14/2023 10:00 A M Medical Record Number: [id number] Patient Account Number: [id number] SENNA, STOTZ ([id number]) [id number] Page 11 of 21 Date of Birth/Sex: Treating RN: February 20, 1951 (72 y.o. M) Primary Care Provider: Other Clinician: [name] Referring Provider: Treating Provider/Extender: [name] in Treatment: 100 Subjective Chief Complaint Information obtained from Patient Left leg and foot ulcers 04/12/2021; patient is here for wounds on his left lower leg and left plantar foot over the first metatarsal head History of Present Illness (HPI) 10/11/17; [name] is a 72 year old man who tells me that in 2015 he slipped down the latter traumatizing his left leg. He developed a  wound in the same spot the area that we are currently looking at. He states this closed over for the most part although he always felt it was somewhat unstable. In 2016 he hit the same area with the door of his car had this reopened. He tells me that this is never really closed although sometimes an inflow it remains open on a constant basis. He has not been using any specific dressing to this except for topical antibiotics the nature of which were not really sure. His primary doctor did send him to see Dr. Jacinto Halim of interventional cardiology. He underwent an angiogram on 08/06/17 and he underwent a PTA and directional atherectomy of the lesser distal SFA and popliteal arteries which resulted in brisk improvement in blood flow. It was noted that he had 2 vessel runoff through the anterior tibial and peroneal. He is also been to see vascular and interventional radiologist. He was not felt to have any significant superficial venous insufficiency. Presumably is not a candidate for any ablation. It was suggested he come here for wound care. The patient is a type II diabetic on insulin. He also has a history of venous insufficiency. ABIs on the left were noncompressible in our clinic 10/21/17; patient we admitted to the clinic last week. 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 We put Iodosorb on him after an aggressive debridement and 3 layer compression. He complained of pain in his ankle and itching with is skin in fact he scratched the area on the medial calf superiorly at the rim of our wraps and he has 2 small open areas in that location today which are new. I changed his primary dressing today to silver collagen. As noted he is already had revascularization and does not have any significant superficial venous insufficiency that would be amenable to ablation 10/28/17; patient admitted to the clinic 2 weeks ago. He has a smaller Wound.  Scratch injury from last week revealed. There is large wound over the tibial area. This is smaller. Granulation looks healthy. No need for debridement. 11/04/17; the wound on the left lateral calf looks better. Improved dimensions. Surface of this looks better. We've been maintaining him and Kerlix Coban wraps. He finds this much more comfortable. Silver collagen dressing 11/11/17; left lateral Wound continues to look healthy be making progress. Using a #5 curet I removed removed nonviable skin  Description Active Date Resolved Date L97.828 Non-pressure chronic ulcer of other part of left lower leg with other specified severity 04/12/2021 04/12/2021 L97.828 Non-pressure chronic ulcer of other part of left lower leg with other specified severity 01/10/2023 06/08/2022 Electronic Signature(s) Signed: 03/14/2023 10:31:32 AM By: [name] MD FACS Entered By: [name] on 03/14/2023 07:31:32 -------------------------------------------------------------------------------- Progress Note Details Patient Name: Date of Service: [name]. 03/14/2023 10:00 A M Medical Record Number: [id number] Patient Account Number: [id number] SENNA, STOTZ ([id number]) [id number] Page 11 of 21 Date of Birth/Sex: Treating RN: February 20, 1951 (72 y.o. M) Primary Care Provider: Other Clinician: [name] Referring Provider: Treating Provider/Extender: [name] in Treatment: 100 Subjective Chief Complaint Information obtained from Patient Left leg and foot ulcers 04/12/2021; patient is here for wounds on his left lower leg and left plantar foot over the first metatarsal head History of Present Illness (HPI) 10/11/17; [name] is a 72 year old man who tells me that in 2015 he slipped down the latter traumatizing his left leg. He developed a  wound in the same spot the area that we are currently looking at. He states this closed over for the most part although he always felt it was somewhat unstable. In 2016 he hit the same area with the door of his car had this reopened. He tells me that this is never really closed although sometimes an inflow it remains open on a constant basis. He has not been using any specific dressing to this except for topical antibiotics the nature of which were not really sure. His primary doctor did send him to see Dr. Jacinto Halim of interventional cardiology. He underwent an angiogram on 08/06/17 and he underwent a PTA and directional atherectomy of the lesser distal SFA and popliteal arteries which resulted in brisk improvement in blood flow. It was noted that he had 2 vessel runoff through the anterior tibial and peroneal. He is also been to see vascular and interventional radiologist. He was not felt to have any significant superficial venous insufficiency. Presumably is not a candidate for any ablation. It was suggested he come here for wound care. The patient is a type II diabetic on insulin. He also has a history of venous insufficiency. ABIs on the left were noncompressible in our clinic 10/21/17; patient we admitted to the clinic last week. 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 We put Iodosorb on him after an aggressive debridement and 3 layer compression. He complained of pain in his ankle and itching with is skin in fact he scratched the area on the medial calf superiorly at the rim of our wraps and he has 2 small open areas in that location today which are new. I changed his primary dressing today to silver collagen. As noted he is already had revascularization and does not have any significant superficial venous insufficiency that would be amenable to ablation 10/28/17; patient admitted to the clinic 2 weeks ago. He has a smaller Wound.  Scratch injury from last week revealed. There is large wound over the tibial area. This is smaller. Granulation looks healthy. No need for debridement. 11/04/17; the wound on the left lateral calf looks better. Improved dimensions. Surface of this looks better. We've been maintaining him and Kerlix Coban wraps. He finds this much more comfortable. Silver collagen dressing 11/11/17; left lateral Wound continues to look healthy be making progress. Using a #5 curet I removed removed nonviable skin  Former smoker - quit 1999; Marital Status - Married; Alcohol Use: Moderate; Drug Use: No History; Caffeine Use: Rarely; Financial Concerns: No; Food, Clothing or Shelter Needs: No; Support System Lacking: No; Transportation Concerns: No Electronic Signature(s) Signed: 03/14/2023 11:42:07 AM By: [name] MD FACS Entered By: [name] on 03/14/2023 07:34:50 -------------------------------------------------------------------------------- SuperBill Details Patient Name: Date of Service: [name]. 03/14/2023 Medical Record  Number: [id number] Patient Account Number: [id number] Date of Birth/Sex: Treating RN: 06/13/1951 (72 y.o. 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 I89.0 Lymphedema, not elsewhere classified L85.9 Epidermal thickening, unspecified Facility Procedures : CPT4 Code: [id number] Description: 97597 - DEBRIDE WOUND 1ST 20 SQ CM OR < ICD-10 Diagnosis Description L97.528 Non-pressure chronic ulcer of other part of left foot with other specified severi Modifier: ty Quantity: 1 Physician Procedures : CPT4 Code Description Modifier [id number] 99214 - WC PHYS LEVEL 4 - EST PT 25 ICD-10 Diagnosis Description L97.528 Non-pressure chronic ulcer of other part of left foot with other specified severity I87.322 Chronic venous hypertension (idiopathic) with  inflammation of left lower extremity E11.51 Type 2 diabetes mellitus with diabetic peripheral angiopathy without gangrene I89.0 Lymphedema, not elsewhere classified Quantity: 1 : [id number] 97597 - WC PHYS DEBR WO ANESTH 20 SQ CM ICD-10 Diagnosis Description L97.528 Non-pressure chronic ulcer of other part of left foot with other specified severity Quantity: 1 Electronic Signature(s) Signed: 03/14/2023 10:38:56 AM By: [name] MD FACS Entered By: [name] on 03/14/2023 07:38:55  HERVEY, ESCARENO ([id number]) [id number] Page 1 of 21 Visit Report for 03/14/2023 Chief Complaint Document Details Patient Name: Date of Service: PATRIK, GRUDZINSKI 03/14/2023 10:00 A M Medical Record Number: [id number] Patient Account Number: [id number] Date of Birth/Sex: Treating RN: 08/28/1950 (72 y.o. M) Primary Care Provider: [name] Other Clinician: Referring Provider: Treating Provider/Extender: [name] in Treatment: 100 Information Obtained from: Patient Chief Complaint Left leg and foot ulcers 04/12/2021; patient is here for wounds on his left lower leg and left plantar foot over the first metatarsal head Electronic Signature(s) Signed: 03/14/2023 10:33:07 AM By: [name] MD FACS Entered By: [name] on 03/14/2023 07:33:07 -------------------------------------------------------------------------------- Debridement Details Patient Name: Date of Service: [name]. 03/14/2023 10:00 A M Medical Record Number: [id number] Patient Account Number: [id number] Date of Birth/Sex: Treating RN: 17-Nov-1950 (72 y.o. [name] Primary Care Provider: [name] Other Clinician: Referring Provider: Treating Provider/Extender: [name] in Treatment: 100 Debridement Performed for Assessment: Wound #18R Left,Plantar Metatarsal head first Performed By: Physician [name], MD The following information was scribed by: [name] The information was scribed for: [name] Debridement Type: Debridement Severity of Tissue Pre Debridement: Fat layer exposed Level of Consciousness (Pre-procedure): Awake and Alert Pre-procedure Verification/Time Out Yes - 10:15 Taken: Start Time: 10:15 Pain Control: Lidocaine 4% T opical Solution Percent of Wound Bed Debrided: 100% T Area Debrided (cm): otal 1.88 Tissue and other material debrided: Non-Viable, Slough, Skin:  Epidermis, Slough Level: Skin/Epidermis Debridement Description: Selective/Open Wound Instrument: Curette Bleeding: Minimum Hemostasis Achieved: Pressure Response to Treatment: Procedure was tolerated well Level of Consciousness (Post- Awake and Alert procedure): Post Debridement Measurements of Total Wound Length: (cm) 1.2 Width: (cm) 2 Depth: (cm) 0.1 Volume: (cm) 0.188 Character of Wound/Ulcer Post Debridement: Improved [name] ([id number]) [id number] Page 2 of 21 Severity of Tissue Post Debridement: Fat layer exposed Post Procedure Diagnosis Same as Pre-procedure Electronic Signature(s) Signed: 03/14/2023 11:42:07 AM By: [name] MD FACS Signed: 03/14/2023 3:46:51 PM By: [name] Entered By: [name] on 03/14/2023 07:29:10 -------------------------------------------------------------------------------- HPI Details Patient Name: Date of Service: [name]. 03/14/2023 10:00 A M Medical Record Number: [id number] Patient Account Number: [id number] Date of Birth/Sex: Treating RN: [birth year] (72 y.o. M) Primary Care Provider: [name] Other Clinician: Referring Provider: Treating Provider/Extender: [name] in Treatment: 100 History of Present Illness HPI Description: 10/11/17; Mr. Errante is a 72 year old man who tells me that in 2015 he slipped down the latter traumatizing his left leg. He developed a wound in the same spot the area that we are currently looking at. He states this closed over for the most part although he always felt it was somewhat unstable. In 2016 he hit the same area with the door of his car had this reopened. He tells me that this is never really closed although sometimes an inflow it remains open on a constant basis. He has not been using any specific dressing to this except for topical antibiotics the nature of which were not really sure. His primary doctor did  send him to see Dr. Jacinto Halim of interventional cardiology. He underwent an angiogram on 08/06/17 and he underwent a PTA and directional atherectomy of the lesser distal SFA and popliteal arteries which resulted in brisk improvement in blood flow. It was noted that he had 2 vessel runoff through the anterior tibial and peroneal. He is also been to see vascular and interventional radiologist. He was  Former smoker - quit 1999; Marital Status - Married; Alcohol Use: Moderate; Drug Use: No History; Caffeine Use: Rarely; Financial Concerns: No; Food, Clothing or Shelter Needs: No; Support System Lacking: No; Transportation Concerns: No Electronic Signature(s) Signed: 03/14/2023 11:42:07 AM By: [name] MD FACS Entered By: [name] on 03/14/2023 07:34:50 -------------------------------------------------------------------------------- SuperBill Details Patient Name: Date of Service: [name]. 03/14/2023 Medical Record  Number: [id number] Patient Account Number: [id number] Date of Birth/Sex: Treating RN: 06/13/1951 (72 y.o. 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 I89.0 Lymphedema, not elsewhere classified L85.9 Epidermal thickening, unspecified Facility Procedures : CPT4 Code: 72536644 Description: 97597 - DEBRIDE WOUND 1ST 20 SQ CM OR < ICD-10 Diagnosis Description L97.528 Non-pressure chronic ulcer of other part of left foot with other specified severi Modifier: ty Quantity: 1 Physician Procedures : CPT4 Code Description Modifier [id number] 99214 - WC PHYS LEVEL 4 - EST PT 25 ICD-10 Diagnosis Description L97.528 Non-pressure chronic ulcer of other part of left foot with other specified severity I87.322 Chronic venous hypertension (idiopathic) with  inflammation of left lower extremity E11.51 Type 2 diabetes mellitus with diabetic peripheral angiopathy without gangrene I89.0 Lymphedema, not elsewhere classified Quantity: 1 : [id number] 97597 - WC PHYS DEBR WO ANESTH 20 SQ CM ICD-10 Diagnosis Description L97.528 Non-pressure chronic ulcer of other part of left foot with other specified severity Quantity: 1 Electronic Signature(s) Signed: 03/14/2023 10:38:56 AM By: [name] MD FACS Entered By: [name] on 03/14/2023 07:38:55  example, large plastic bag) or a cast cover and may then take shower. Edema Control - Lymphedema / SCD / Other Elevate legs to the level of the heart or above for 30 minutes daily and/or when sitting for 3-4 times a day throughout the day. DAILAN, RENTFRO ([id number]) [id number] Page 9 of 21 Avoid standing for long periods of time. Patient to wear own compression stockings every day. - both legs daily Moisturize legs daily. Compression stocking or Garment 20-30 mm/Hg pressure to: Off-Loading Wound #18R Left,Plantar Metatarsal head first Open toe surgical shoe to: - Front off loader Left ft Other: - minimal weight bearing left foot Additional Orders / Instructions Follow Nutritious Diet - vitamin C 500 mg 3 times a day and zinc 30-50 mg a day Wound Treatment Wound #18R - Metatarsal head first Wound Laterality: Plantar, Left Cleanser: Soap and Water 1 x Per Week/30 Days Discharge Instructions: May shower and wash wound with dial antibacterial soap and water prior to dressing change. Cleanser: Wound Cleanser 1 x Per Week/30 Days Discharge Instructions: Cleanse the wound with wound cleanser prior to applying a clean dressing using gauze sponges, not tissue or cotton balls. Peri-Wound Care: Ketoconazole Cream 2% 1 x Per Week/30 Days Discharge Instructions: Apply Ketoconazole as directed Peri-Wound Care: Triamcinolone 15 (g) 1 x Per Week/30 Days Discharge Instructions: Use triamcinolone 15 (g) as directed Topical: Gentamicin 1 x Per Week/30 Days Discharge Instructions: As directed by physician Topical: Mupirocin Ointment 1 x Per Week/30 Days Discharge Instructions: Apply  Mupirocin (Bactroban) as instructed Prim Dressing: Maxorb Extra Ag+ Alginate Dressing, 2x2 (in/in) 1 x Per Week/30 Days ary Discharge Instructions: Apply to wound bed as instructed Secondary Dressing: Optifoam Non-Adhesive Dressing, 4x4 in (Generic) 1 x Per Week/30 Days Discharge Instructions: Apply over primary dressing as directed. Secondary Dressing: Woven Gauze Sponges 2x2 in (Generic) 1 x Per Week/30 Days Discharge Instructions: Apply over primary dressing as directed. Secondary Dressing: Zetuvit Absorbent Pad, 4x4 (in/in) 1 x Per Week/30 Days Compression Wrap: Urgo K2, (equivalent to a 4 layer) two layer compression system, regular 1 x Per Week/30 Days Discharge Instructions: Apply Urgo K2 as directed (alternative to 4 layer compression). Patient Medications llergies: No Known Drug Allergies A Notifications Medication Indication Start End 03/14/2023 lidocaine DOSE topical 4 % cream - cream topical Electronic Signature(s) Signed: 03/14/2023 11:42:07 AM By: [name] MD FACS Entered By: [name] on 03/14/2023 07:36:37 -------------------------------------------------------------------------------- Problem List Details Patient Name: Date of Service: [name]. 03/14/2023 10:00 A M Medical Record Number: [id number] Patient Account Number: [id number] Date of Birth/Sex: Treating RN: 30-Apr-1951 (72 y.o. 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 08/26/2022 No Yes severity I87.322 Chronic venous hypertension (idiopathic) with inflammation of left lower 04/12/2021 No Yes extremity E11.51 Type 2 diabetes mellitus  with diabetic peripheral angiopathy without gangrene 04/12/2021 No Yes I89.0 Lymphedema, not elsewhere classified 04/12/2021 No Yes L85.9 Epidermal thickening, unspecified 08/30/2022 No Yes Inactive Problems ICD-10 Code Description Active Date Inactive Date E11.621 Type 2 diabetes mellitus with foot ulcer 04/12/2021 04/12/2021 E11.42 Type 2 diabetes mellitus with diabetic polyneuropathy 04/12/2021 04/12/2021 L02.416 Cutaneous abscess of left lower limb 06/13/2021 06/13/2021 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 M) Primary Care Provider: [name] Other Clinician: Referring Provider: Treating Provider/Extender: [name] in Treatment: 100 Information Obtained from: Patient Chief Complaint Left leg and foot ulcers 04/12/2021; patient is here for wounds on his left lower leg and left plantar foot over the first metatarsal head Electronic Signature(s) Signed: 03/14/2023 10:33:07 AM By: [name] MD FACS Entered By: [name] on 03/14/2023 07:33:07 -------------------------------------------------------------------------------- Debridement Details Patient Name: Date of Service: [name]. 03/14/2023 10:00 A M Medical Record Number: [id number] Patient Account Number: [id number] Date of Birth/Sex: Treating RN: 17-Nov-1950 (72 y.o. [name] Primary Care Provider: [name] Other Clinician: Referring Provider: Treating Provider/Extender: [name] in Treatment: 100 Debridement Performed for Assessment: Wound #18R Left,Plantar Metatarsal head first Performed By: Physician [name], MD The following information was scribed by: [name] The information was scribed for: [name] Debridement Type: Debridement Severity of Tissue Pre Debridement: Fat layer exposed Level of Consciousness (Pre-procedure): Awake and Alert Pre-procedure Verification/Time Out Yes - 10:15 Taken: Start Time: 10:15 Pain Control: Lidocaine 4% T opical Solution Percent of Wound Bed Debrided: 100% T Area Debrided (cm): otal 1.88 Tissue and other material debrided: Non-Viable, Slough, Skin:  Epidermis, Slough Level: Skin/Epidermis Debridement Description: Selective/Open Wound Instrument: Curette Bleeding: Minimum Hemostasis Achieved: Pressure Response to Treatment: Procedure was tolerated well Level of Consciousness (Post- Awake and Alert procedure): Post Debridement Measurements of Total Wound Length: (cm) 1.2 Width: (cm) 2 Depth: (cm) 0.1 Volume: (cm) 0.188 Character of Wound/Ulcer Post Debridement: Improved [name] ([id number]) [id number] Page 2 of 21 Severity of Tissue Post Debridement: Fat layer exposed Post Procedure Diagnosis Same as Pre-procedure Electronic Signature(s) Signed: 03/14/2023 11:42:07 AM By: [name] MD FACS Signed: 03/14/2023 3:46:51 PM By: [name] Entered By: [name] on 03/14/2023 07:29:10 -------------------------------------------------------------------------------- HPI Details Patient Name: Date of Service: [name]. 03/14/2023 10:00 A M Medical Record Number: [id number] Patient Account Number: [id number] Date of Birth/Sex: Treating RN: [birth year] (72 y.o. M) Primary Care Provider: [name] Other Clinician: Referring Provider: Treating Provider/Extender: [name] in Treatment: 100 History of Present Illness HPI Description: 10/11/17; Mr. Errante is a 72 year old man who tells me that in 2015 he slipped down the latter traumatizing his left leg. He developed a wound in the same spot the area that we are currently looking at. He states this closed over for the most part although he always felt it was somewhat unstable. In 2016 he hit the same area with the door of his car had this reopened. He tells me that this is never really closed although sometimes an inflow it remains open on a constant basis. He has not been using any specific dressing to this except for topical antibiotics the nature of which were not really sure. His primary doctor did  send him to see Dr. Jacinto Halim of interventional cardiology. He underwent an angiogram on 08/06/17 and he underwent a PTA and directional atherectomy of the lesser distal SFA and popliteal arteries which resulted in brisk improvement in blood flow. It was noted that he had 2 vessel runoff through the anterior tibial and peroneal. He is also been to see vascular and interventional radiologist. He was  Former smoker - quit 1999; Marital Status - Married; Alcohol Use: Moderate; Drug Use: No History; Caffeine Use: Rarely; Financial Concerns: No; Food, Clothing or Shelter Needs: No; Support System Lacking: No; Transportation Concerns: No Electronic Signature(s) Signed: 03/14/2023 11:42:07 AM By: [name] MD FACS Entered By: [name] on 03/14/2023 07:34:50 -------------------------------------------------------------------------------- SuperBill Details Patient Name: Date of Service: [name]. 03/14/2023 Medical Record  Number: [id number] Patient Account Number: [id number] Date of Birth/Sex: Treating RN: 06/13/1951 (72 y.o. 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 I89.0 Lymphedema, not elsewhere classified L85.9 Epidermal thickening, unspecified Facility Procedures : CPT4 Code: 72536644 Description: 97597 - DEBRIDE WOUND 1ST 20 SQ CM OR < ICD-10 Diagnosis Description L97.528 Non-pressure chronic ulcer of other part of left foot with other specified severi Modifier: ty Quantity: 1 Physician Procedures : CPT4 Code Description Modifier [id number] 99214 - WC PHYS LEVEL 4 - EST PT 25 ICD-10 Diagnosis Description L97.528 Non-pressure chronic ulcer of other part of left foot with other specified severity I87.322 Chronic venous hypertension (idiopathic) with  inflammation of left lower extremity E11.51 Type 2 diabetes mellitus with diabetic peripheral angiopathy without gangrene I89.0 Lymphedema, not elsewhere classified Quantity: 1 : [id number] 97597 - WC PHYS DEBR WO ANESTH 20 SQ CM ICD-10 Diagnosis Description L97.528 Non-pressure chronic ulcer of other part of left foot with other specified severity Quantity: 1 Electronic Signature(s) Signed: 03/14/2023 10:38:56 AM By: [name] MD FACS Entered By: [name] on 03/14/2023 07:38:55  Description Active Date Resolved Date L97.828 Non-pressure chronic ulcer of other part of left lower leg with other specified severity 04/12/2021 04/12/2021 L97.828 Non-pressure chronic ulcer of other part of left lower leg with other specified severity 01/10/2023 06/08/2022 Electronic Signature(s) Signed: 03/14/2023 10:31:32 AM By: [name] MD FACS Entered By: [name] on 03/14/2023 07:31:32 -------------------------------------------------------------------------------- Progress Note Details Patient Name: Date of Service: [name]. 03/14/2023 10:00 A M Medical Record Number: [id number] Patient Account Number: [id number] SENNA, STOTZ ([id number]) [id number] Page 11 of 21 Date of Birth/Sex: Treating RN: February 20, 1951 (72 y.o. M) Primary Care Provider: Other Clinician: [name] Referring Provider: Treating Provider/Extender: [name] in Treatment: 100 Subjective Chief Complaint Information obtained from Patient Left leg and foot ulcers 04/12/2021; patient is here for wounds on his left lower leg and left plantar foot over the first metatarsal head History of Present Illness (HPI) 10/11/17; [name] is a 72 year old man who tells me that in 2015 he slipped down the latter traumatizing his left leg. He developed a  wound in the same spot the area that we are currently looking at. He states this closed over for the most part although he always felt it was somewhat unstable. In 2016 he hit the same area with the door of his car had this reopened. He tells me that this is never really closed although sometimes an inflow it remains open on a constant basis. He has not been using any specific dressing to this except for topical antibiotics the nature of which were not really sure. His primary doctor did send him to see Dr. Jacinto Halim of interventional cardiology. He underwent an angiogram on 08/06/17 and he underwent a PTA and directional atherectomy of the lesser distal SFA and popliteal arteries which resulted in brisk improvement in blood flow. It was noted that he had 2 vessel runoff through the anterior tibial and peroneal. He is also been to see vascular and interventional radiologist. He was not felt to have any significant superficial venous insufficiency. Presumably is not a candidate for any ablation. It was suggested he come here for wound care. The patient is a type II diabetic on insulin. He also has a history of venous insufficiency. ABIs on the left were noncompressible in our clinic 10/21/17; patient we admitted to the clinic last week. 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 We put Iodosorb on him after an aggressive debridement and 3 layer compression. He complained of pain in his ankle and itching with is skin in fact he scratched the area on the medial calf superiorly at the rim of our wraps and he has 2 small open areas in that location today which are new. I changed his primary dressing today to silver collagen. As noted he is already had revascularization and does not have any significant superficial venous insufficiency that would be amenable to ablation 10/28/17; patient admitted to the clinic 2 weeks ago. He has a smaller Wound.  Scratch injury from last week revealed. There is large wound over the tibial area. This is smaller. Granulation looks healthy. No need for debridement. 11/04/17; the wound on the left lateral calf looks better. Improved dimensions. Surface of this looks better. We've been maintaining him and Kerlix Coban wraps. He finds this much more comfortable. Silver collagen dressing 11/11/17; left lateral Wound continues to look healthy be making progress. Using a #5 curet I removed removed nonviable skin  Former smoker - quit 1999; Marital Status - Married; Alcohol Use: Moderate; Drug Use: No History; Caffeine Use: Rarely; Financial Concerns: No; Food, Clothing or Shelter Needs: No; Support System Lacking: No; Transportation Concerns: No Electronic Signature(s) Signed: 03/14/2023 11:42:07 AM By: [name] MD FACS Entered By: [name] on 03/14/2023 07:34:50 -------------------------------------------------------------------------------- SuperBill Details Patient Name: Date of Service: [name]. 03/14/2023 Medical Record  Number: [id number] Patient Account Number: [id number] Date of Birth/Sex: Treating RN: 06/13/1951 (72 y.o. 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 I89.0 Lymphedema, not elsewhere classified L85.9 Epidermal thickening, unspecified Facility Procedures : CPT4 Code: 72536644 Description: 97597 - DEBRIDE WOUND 1ST 20 SQ CM OR < ICD-10 Diagnosis Description L97.528 Non-pressure chronic ulcer of other part of left foot with other specified severi Modifier: ty Quantity: 1 Physician Procedures : CPT4 Code Description Modifier [id number] 99214 - WC PHYS LEVEL 4 - EST PT 25 ICD-10 Diagnosis Description L97.528 Non-pressure chronic ulcer of other part of left foot with other specified severity I87.322 Chronic venous hypertension (idiopathic) with  inflammation of left lower extremity E11.51 Type 2 diabetes mellitus with diabetic peripheral angiopathy without gangrene I89.0 Lymphedema, not elsewhere classified Quantity: 1 : [id number] 97597 - WC PHYS DEBR WO ANESTH 20 SQ CM ICD-10 Diagnosis Description L97.528 Non-pressure chronic ulcer of other part of left foot with other specified severity Quantity: 1 Electronic Signature(s) Signed: 03/14/2023 10:38:56 AM By: [name] MD FACS Entered By: [name] on 03/14/2023 07:38:55  example, large plastic bag) or a cast cover and may then take shower. Edema Control - Lymphedema / SCD / Other Elevate legs to the level of the heart or above for 30 minutes daily and/or when sitting for 3-4 times a day throughout the day. DAILAN, RENTFRO ([id number]) [id number] Page 9 of 21 Avoid standing for long periods of time. Patient to wear own compression stockings every day. - both legs daily Moisturize legs daily. Compression stocking or Garment 20-30 mm/Hg pressure to: Off-Loading Wound #18R Left,Plantar Metatarsal head first Open toe surgical shoe to: - Front off loader Left ft Other: - minimal weight bearing left foot Additional Orders / Instructions Follow Nutritious Diet - vitamin C 500 mg 3 times a day and zinc 30-50 mg a day Wound Treatment Wound #18R - Metatarsal head first Wound Laterality: Plantar, Left Cleanser: Soap and Water 1 x Per Week/30 Days Discharge Instructions: May shower and wash wound with dial antibacterial soap and water prior to dressing change. Cleanser: Wound Cleanser 1 x Per Week/30 Days Discharge Instructions: Cleanse the wound with wound cleanser prior to applying a clean dressing using gauze sponges, not tissue or cotton balls. Peri-Wound Care: Ketoconazole Cream 2% 1 x Per Week/30 Days Discharge Instructions: Apply Ketoconazole as directed Peri-Wound Care: Triamcinolone 15 (g) 1 x Per Week/30 Days Discharge Instructions: Use triamcinolone 15 (g) as directed Topical: Gentamicin 1 x Per Week/30 Days Discharge Instructions: As directed by physician Topical: Mupirocin Ointment 1 x Per Week/30 Days Discharge Instructions: Apply  Mupirocin (Bactroban) as instructed Prim Dressing: Maxorb Extra Ag+ Alginate Dressing, 2x2 (in/in) 1 x Per Week/30 Days ary Discharge Instructions: Apply to wound bed as instructed Secondary Dressing: Optifoam Non-Adhesive Dressing, 4x4 in (Generic) 1 x Per Week/30 Days Discharge Instructions: Apply over primary dressing as directed. Secondary Dressing: Woven Gauze Sponges 2x2 in (Generic) 1 x Per Week/30 Days Discharge Instructions: Apply over primary dressing as directed. Secondary Dressing: Zetuvit Absorbent Pad, 4x4 (in/in) 1 x Per Week/30 Days Compression Wrap: Urgo K2, (equivalent to a 4 layer) two layer compression system, regular 1 x Per Week/30 Days Discharge Instructions: Apply Urgo K2 as directed (alternative to 4 layer compression). Patient Medications llergies: No Known Drug Allergies A Notifications Medication Indication Start End 03/14/2023 lidocaine DOSE topical 4 % cream - cream topical Electronic Signature(s) Signed: 03/14/2023 11:42:07 AM By: [name] MD FACS Entered By: [name] on 03/14/2023 07:36:37 -------------------------------------------------------------------------------- Problem List Details Patient Name: Date of Service: [name]. 03/14/2023 10:00 A M Medical Record Number: [id number] Patient Account Number: [id number] Date of Birth/Sex: Treating RN: 30-Apr-1951 (72 y.o. 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 08/26/2022 No Yes severity I87.322 Chronic venous hypertension (idiopathic) with inflammation of left lower 04/12/2021 No Yes extremity E11.51 Type 2 diabetes mellitus  with diabetic peripheral angiopathy without gangrene 04/12/2021 No Yes I89.0 Lymphedema, not elsewhere classified 04/12/2021 No Yes L85.9 Epidermal thickening, unspecified 08/30/2022 No Yes Inactive Problems ICD-10 Code Description Active Date Inactive Date E11.621 Type 2 diabetes mellitus with foot ulcer 04/12/2021 04/12/2021 E11.42 Type 2 diabetes mellitus with diabetic polyneuropathy 04/12/2021 04/12/2021 L02.416 Cutaneous abscess of left lower limb 06/13/2021 06/13/2021 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 01/24/2023 07/20/2021 Resolved Problems ICD-10 Code  Description Active Date Resolved Date L97.828 Non-pressure chronic ulcer of other part of left lower leg with other specified severity 04/12/2021 04/12/2021 L97.828 Non-pressure chronic ulcer of other part of left lower leg with other specified severity 01/10/2023 06/08/2022 Electronic Signature(s) Signed: 03/14/2023 10:31:32 AM By: [name] MD FACS Entered By: [name] on 03/14/2023 07:31:32 -------------------------------------------------------------------------------- Progress Note Details Patient Name: Date of Service: [name]. 03/14/2023 10:00 A M Medical Record Number: [id number] Patient Account Number: [id number] SENNA, STOTZ ([id number]) [id number] Page 11 of 21 Date of Birth/Sex: Treating RN: February 20, 1951 (72 y.o. M) Primary Care Provider: Other Clinician: [name] Referring Provider: Treating Provider/Extender: [name] in Treatment: 100 Subjective Chief Complaint Information obtained from Patient Left leg and foot ulcers 04/12/2021; patient is here for wounds on his left lower leg and left plantar foot over the first metatarsal head History of Present Illness (HPI) 10/11/17; [name] is a 72 year old man who tells me that in 2015 he slipped down the latter traumatizing his left leg. He developed a  wound in the same spot the area that we are currently looking at. He states this closed over for the most part although he always felt it was somewhat unstable. In 2016 he hit the same area with the door of his car had this reopened. He tells me that this is never really closed although sometimes an inflow it remains open on a constant basis. He has not been using any specific dressing to this except for topical antibiotics the nature of which were not really sure. His primary doctor did send him to see Dr. Jacinto Halim of interventional cardiology. He underwent an angiogram on 08/06/17 and he underwent a PTA and directional atherectomy of the lesser distal SFA and popliteal arteries which resulted in brisk improvement in blood flow. It was noted that he had 2 vessel runoff through the anterior tibial and peroneal. He is also been to see vascular and interventional radiologist. He was not felt to have any significant superficial venous insufficiency. Presumably is not a candidate for any ablation. It was suggested he come here for wound care. The patient is a type II diabetic on insulin. He also has a history of venous insufficiency. ABIs on the left were noncompressible in our clinic 10/21/17; patient we admitted to the clinic last week. 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 We put Iodosorb on him after an aggressive debridement and 3 layer compression. He complained of pain in his ankle and itching with is skin in fact he scratched the area on the medial calf superiorly at the rim of our wraps and he has 2 small open areas in that location today which are new. I changed his primary dressing today to silver collagen. As noted he is already had revascularization and does not have any significant superficial venous insufficiency that would be amenable to ablation 10/28/17; patient admitted to the clinic 2 weeks ago. He has a smaller Wound.  Scratch injury from last week revealed. There is large wound over the tibial area. This is smaller. Granulation looks healthy. No need for debridement. 11/04/17; the wound on the left lateral calf looks better. Improved dimensions. Surface of this looks better. We've been maintaining him and Kerlix Coban wraps. He finds this much more comfortable. Silver collagen dressing 11/11/17; left lateral Wound continues to look healthy be making progress. Using a #5 curet I removed removed nonviable skin  Description Active Date Resolved Date L97.828 Non-pressure chronic ulcer of other part of left lower leg with other specified severity 04/12/2021 04/12/2021 L97.828 Non-pressure chronic ulcer of other part of left lower leg with other specified severity 01/10/2023 06/08/2022 Electronic Signature(s) Signed: 03/14/2023 10:31:32 AM By: [name] MD FACS Entered By: [name] on 03/14/2023 07:31:32 -------------------------------------------------------------------------------- Progress Note Details Patient Name: Date of Service: [name]. 03/14/2023 10:00 A M Medical Record Number: [id number] Patient Account Number: [id number] SENNA, STOTZ ([id number]) [id number] Page 11 of 21 Date of Birth/Sex: Treating RN: February 20, 1951 (72 y.o. M) Primary Care Provider: Other Clinician: [name] Referring Provider: Treating Provider/Extender: [name] in Treatment: 100 Subjective Chief Complaint Information obtained from Patient Left leg and foot ulcers 04/12/2021; patient is here for wounds on his left lower leg and left plantar foot over the first metatarsal head History of Present Illness (HPI) 10/11/17; [name] is a 72 year old man who tells me that in 2015 he slipped down the latter traumatizing his left leg. He developed a  wound in the same spot the area that we are currently looking at. He states this closed over for the most part although he always felt it was somewhat unstable. In 2016 he hit the same area with the door of his car had this reopened. He tells me that this is never really closed although sometimes an inflow it remains open on a constant basis. He has not been using any specific dressing to this except for topical antibiotics the nature of which were not really sure. His primary doctor did send him to see Dr. Jacinto Halim of interventional cardiology. He underwent an angiogram on 08/06/17 and he underwent a PTA and directional atherectomy of the lesser distal SFA and popliteal arteries which resulted in brisk improvement in blood flow. It was noted that he had 2 vessel runoff through the anterior tibial and peroneal. He is also been to see vascular and interventional radiologist. He was not felt to have any significant superficial venous insufficiency. Presumably is not a candidate for any ablation. It was suggested he come here for wound care. The patient is a type II diabetic on insulin. He also has a history of venous insufficiency. ABIs on the left were noncompressible in our clinic 10/21/17; patient we admitted to the clinic last week. 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 We put Iodosorb on him after an aggressive debridement and 3 layer compression. He complained of pain in his ankle and itching with is skin in fact he scratched the area on the medial calf superiorly at the rim of our wraps and he has 2 small open areas in that location today which are new. I changed his primary dressing today to silver collagen. As noted he is already had revascularization and does not have any significant superficial venous insufficiency that would be amenable to ablation 10/28/17; patient admitted to the clinic 2 weeks ago. He has a smaller Wound.  Scratch injury from last week revealed. There is large wound over the tibial area. This is smaller. Granulation looks healthy. No need for debridement. 11/04/17; the wound on the left lateral calf looks better. Improved dimensions. Surface of this looks better. We've been maintaining him and Kerlix Coban wraps. He finds this much more comfortable. Silver collagen dressing 11/11/17; left lateral Wound continues to look healthy be making progress. Using a #5 curet I removed removed nonviable skin  Former smoker - quit 1999; Marital Status - Married; Alcohol Use: Moderate; Drug Use: No History; Caffeine Use: Rarely; Financial Concerns: No; Food, Clothing or Shelter Needs: No; Support System Lacking: No; Transportation Concerns: No Electronic Signature(s) Signed: 03/14/2023 11:42:07 AM By: [name] MD FACS Entered By: [name] on 03/14/2023 07:34:50 -------------------------------------------------------------------------------- SuperBill Details Patient Name: Date of Service: [name]. 03/14/2023 Medical Record  Number: [id number] Patient Account Number: [id number] Date of Birth/Sex: Treating RN: 06/13/1951 (72 y.o. 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 I89.0 Lymphedema, not elsewhere classified L85.9 Epidermal thickening, unspecified Facility Procedures : CPT4 Code: 72536644 Description: 97597 - DEBRIDE WOUND 1ST 20 SQ CM OR < ICD-10 Diagnosis Description L97.528 Non-pressure chronic ulcer of other part of left foot with other specified severi Modifier: ty Quantity: 1 Physician Procedures : CPT4 Code Description Modifier [id number] 99214 - WC PHYS LEVEL 4 - EST PT 25 ICD-10 Diagnosis Description L97.528 Non-pressure chronic ulcer of other part of left foot with other specified severity I87.322 Chronic venous hypertension (idiopathic) with  inflammation of left lower extremity E11.51 Type 2 diabetes mellitus with diabetic peripheral angiopathy without gangrene I89.0 Lymphedema, not elsewhere classified Quantity: 1 : [id number] 97597 - WC PHYS DEBR WO ANESTH 20 SQ CM ICD-10 Diagnosis Description L97.528 Non-pressure chronic ulcer of other part of left foot with other specified severity Quantity: 1 Electronic Signature(s) Signed: 03/14/2023 10:38:56 AM By: [name] MD FACS Entered By: [name] on 03/14/2023 07:38:55  Former smoker - quit 1999; Marital Status - Married; Alcohol Use: Moderate; Drug Use: No History; Caffeine Use: Rarely; Financial Concerns: No; Food, Clothing or Shelter Needs: No; Support System Lacking: No; Transportation Concerns: No Electronic Signature(s) Signed: 03/14/2023 11:42:07 AM By: [name] MD FACS Entered By: [name] on 03/14/2023 07:34:50 -------------------------------------------------------------------------------- SuperBill Details Patient Name: Date of Service: [name]. 03/14/2023 Medical Record  Number: [id number] Patient Account Number: [id number] Date of Birth/Sex: Treating RN: 06/13/1951 (72 y.o. 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 I89.0 Lymphedema, not elsewhere classified L85.9 Epidermal thickening, unspecified Facility Procedures : CPT4 Code: 72536644 Description: 97597 - DEBRIDE WOUND 1ST 20 SQ CM OR < ICD-10 Diagnosis Description L97.528 Non-pressure chronic ulcer of other part of left foot with other specified severi Modifier: ty Quantity: 1 Physician Procedures : CPT4 Code Description Modifier [id number] 99214 - WC PHYS LEVEL 4 - EST PT 25 ICD-10 Diagnosis Description L97.528 Non-pressure chronic ulcer of other part of left foot with other specified severity I87.322 Chronic venous hypertension (idiopathic) with  inflammation of left lower extremity E11.51 Type 2 diabetes mellitus with diabetic peripheral angiopathy without gangrene I89.0 Lymphedema, not elsewhere classified Quantity: 1 : [id number] 97597 - WC PHYS DEBR WO ANESTH 20 SQ CM ICD-10 Diagnosis Description L97.528 Non-pressure chronic ulcer of other part of left foot with other specified severity Quantity: 1 Electronic Signature(s) Signed: 03/14/2023 10:38:56 AM By: [name] MD FACS Entered By: [name] on 03/14/2023 07:38:55  HERVEY, ESCARENO ([id number]) [id number] Page 1 of 21 Visit Report for 03/14/2023 Chief Complaint Document Details Patient Name: Date of Service: PATRIK, GRUDZINSKI 03/14/2023 10:00 A M Medical Record Number: [id number] Patient Account Number: [id number] Date of Birth/Sex: Treating RN: 08/28/1950 (72 y.o. M) Primary Care Provider: [name] Other Clinician: Referring Provider: Treating Provider/Extender: [name] in Treatment: 100 Information Obtained from: Patient Chief Complaint Left leg and foot ulcers 04/12/2021; patient is here for wounds on his left lower leg and left plantar foot over the first metatarsal head Electronic Signature(s) Signed: 03/14/2023 10:33:07 AM By: [name] MD FACS Entered By: [name] on 03/14/2023 07:33:07 -------------------------------------------------------------------------------- Debridement Details Patient Name: Date of Service: [name]. 03/14/2023 10:00 A M Medical Record Number: [id number] Patient Account Number: [id number] Date of Birth/Sex: Treating RN: 17-Nov-1950 (72 y.o. [name] Primary Care Provider: [name] Other Clinician: Referring Provider: Treating Provider/Extender: [name] in Treatment: 100 Debridement Performed for Assessment: Wound #18R Left,Plantar Metatarsal head first Performed By: Physician [name], MD The following information was scribed by: [name] The information was scribed for: [name] Debridement Type: Debridement Severity of Tissue Pre Debridement: Fat layer exposed Level of Consciousness (Pre-procedure): Awake and Alert Pre-procedure Verification/Time Out Yes - 10:15 Taken: Start Time: 10:15 Pain Control: Lidocaine 4% T opical Solution Percent of Wound Bed Debrided: 100% T Area Debrided (cm): otal 1.88 Tissue and other material debrided: Non-Viable, Slough, Skin:  Epidermis, Slough Level: Skin/Epidermis Debridement Description: Selective/Open Wound Instrument: Curette Bleeding: Minimum Hemostasis Achieved: Pressure Response to Treatment: Procedure was tolerated well Level of Consciousness (Post- Awake and Alert procedure): Post Debridement Measurements of Total Wound Length: (cm) 1.2 Width: (cm) 2 Depth: (cm) 0.1 Volume: (cm) 0.188 Character of Wound/Ulcer Post Debridement: Improved [name] ([id number]) [id number] Page 2 of 21 Severity of Tissue Post Debridement: Fat layer exposed Post Procedure Diagnosis Same as Pre-procedure Electronic Signature(s) Signed: 03/14/2023 11:42:07 AM By: [name] MD FACS Signed: 03/14/2023 3:46:51 PM By: [name] Entered By: [name] on 03/14/2023 07:29:10 -------------------------------------------------------------------------------- HPI Details Patient Name: Date of Service: [name]. 03/14/2023 10:00 A M Medical Record Number: [id number] Patient Account Number: [id number] Date of Birth/Sex: Treating RN: [birth year] (72 y.o. M) Primary Care Provider: [name] Other Clinician: Referring Provider: Treating Provider/Extender: [name] in Treatment: 100 History of Present Illness HPI Description: 10/11/17; Mr. Errante is a 72 year old man who tells me that in 2015 he slipped down the latter traumatizing his left leg. He developed a wound in the same spot the area that we are currently looking at. He states this closed over for the most part although he always felt it was somewhat unstable. In 2016 he hit the same area with the door of his car had this reopened. He tells me that this is never really closed although sometimes an inflow it remains open on a constant basis. He has not been using any specific dressing to this except for topical antibiotics the nature of which were not really sure. His primary doctor did  send him to see Dr. Jacinto Halim of interventional cardiology. He underwent an angiogram on 08/06/17 and he underwent a PTA and directional atherectomy of the lesser distal SFA and popliteal arteries which resulted in brisk improvement in blood flow. It was noted that he had 2 vessel runoff through the anterior tibial and peroneal. He is also been to see vascular and interventional radiologist. He was  HERVEY, ESCARENO ([id number]) [id number] Page 1 of 21 Visit Report for 03/14/2023 Chief Complaint Document Details Patient Name: Date of Service: PATRIK, GRUDZINSKI 03/14/2023 10:00 A M Medical Record Number: [id number] Patient Account Number: [id number] Date of Birth/Sex: Treating RN: 08/28/1950 (72 y.o. M) Primary Care Provider: [name] Other Clinician: Referring Provider: Treating Provider/Extender: [name] in Treatment: 100 Information Obtained from: Patient Chief Complaint Left leg and foot ulcers 04/12/2021; patient is here for wounds on his left lower leg and left plantar foot over the first metatarsal head Electronic Signature(s) Signed: 03/14/2023 10:33:07 AM By: [name] MD FACS Entered By: [name] on 03/14/2023 07:33:07 -------------------------------------------------------------------------------- Debridement Details Patient Name: Date of Service: [name]. 03/14/2023 10:00 A M Medical Record Number: [id number] Patient Account Number: [id number] Date of Birth/Sex: Treating RN: 17-Nov-1950 (72 y.o. [name] Primary Care Provider: [name] Other Clinician: Referring Provider: Treating Provider/Extender: [name] in Treatment: 100 Debridement Performed for Assessment: Wound #18R Left,Plantar Metatarsal head first Performed By: Physician [name], MD The following information was scribed by: [name] The information was scribed for: [name] Debridement Type: Debridement Severity of Tissue Pre Debridement: Fat layer exposed Level of Consciousness (Pre-procedure): Awake and Alert Pre-procedure Verification/Time Out Yes - 10:15 Taken: Start Time: 10:15 Pain Control: Lidocaine 4% T opical Solution Percent of Wound Bed Debrided: 100% T Area Debrided (cm): otal 1.88 Tissue and other material debrided: Non-Viable, Slough, Skin:  Epidermis, Slough Level: Skin/Epidermis Debridement Description: Selective/Open Wound Instrument: Curette Bleeding: Minimum Hemostasis Achieved: Pressure Response to Treatment: Procedure was tolerated well Level of Consciousness (Post- Awake and Alert procedure): Post Debridement Measurements of Total Wound Length: (cm) 1.2 Width: (cm) 2 Depth: (cm) 0.1 Volume: (cm) 0.188 Character of Wound/Ulcer Post Debridement: Improved [name] ([id number]) [id number] Page 2 of 21 Severity of Tissue Post Debridement: Fat layer exposed Post Procedure Diagnosis Same as Pre-procedure Electronic Signature(s) Signed: 03/14/2023 11:42:07 AM By: [name] MD FACS Signed: 03/14/2023 3:46:51 PM By: [name] Entered By: [name] on 03/14/2023 07:29:10 -------------------------------------------------------------------------------- HPI Details Patient Name: Date of Service: [name]. 03/14/2023 10:00 A M Medical Record Number: [id number] Patient Account Number: [id number] Date of Birth/Sex: Treating RN: [birth year] (72 y.o. M) Primary Care Provider: [name] Other Clinician: Referring Provider: Treating Provider/Extender: [name] in Treatment: 100 History of Present Illness HPI Description: 10/11/17; Mr. Errante is a 72 year old man who tells me that in 2015 he slipped down the latter traumatizing his left leg. He developed a wound in the same spot the area that we are currently looking at. He states this closed over for the most part although he always felt it was somewhat unstable. In 2016 he hit the same area with the door of his car had this reopened. He tells me that this is never really closed although sometimes an inflow it remains open on a constant basis. He has not been using any specific dressing to this except for topical antibiotics the nature of which were not really sure. His primary doctor did  send him to see Dr. Jacinto Halim of interventional cardiology. He underwent an angiogram on 08/06/17 and he underwent a PTA and directional atherectomy of the lesser distal SFA and popliteal arteries which resulted in brisk improvement in blood flow. It was noted that he had 2 vessel runoff through the anterior tibial and peroneal. He is also been to see vascular and interventional radiologist. He was  example, large plastic bag) or a cast cover and may then take shower. Edema Control - Lymphedema / SCD / Other Elevate legs to the level of the heart or above for 30 minutes daily and/or when sitting for 3-4 times a day throughout the day. DAILAN, RENTFRO ([id number]) [id number] Page 9 of 21 Avoid standing for long periods of time. Patient to wear own compression stockings every day. - both legs daily Moisturize legs daily. Compression stocking or Garment 20-30 mm/Hg pressure to: Off-Loading Wound #18R Left,Plantar Metatarsal head first Open toe surgical shoe to: - Front off loader Left ft Other: - minimal weight bearing left foot Additional Orders / Instructions Follow Nutritious Diet - vitamin C 500 mg 3 times a day and zinc 30-50 mg a day Wound Treatment Wound #18R - Metatarsal head first Wound Laterality: Plantar, Left Cleanser: Soap and Water 1 x Per Week/30 Days Discharge Instructions: May shower and wash wound with dial antibacterial soap and water prior to dressing change. Cleanser: Wound Cleanser 1 x Per Week/30 Days Discharge Instructions: Cleanse the wound with wound cleanser prior to applying a clean dressing using gauze sponges, not tissue or cotton balls. Peri-Wound Care: Ketoconazole Cream 2% 1 x Per Week/30 Days Discharge Instructions: Apply Ketoconazole as directed Peri-Wound Care: Triamcinolone 15 (g) 1 x Per Week/30 Days Discharge Instructions: Use triamcinolone 15 (g) as directed Topical: Gentamicin 1 x Per Week/30 Days Discharge Instructions: As directed by physician Topical: Mupirocin Ointment 1 x Per Week/30 Days Discharge Instructions: Apply  Mupirocin (Bactroban) as instructed Prim Dressing: Maxorb Extra Ag+ Alginate Dressing, 2x2 (in/in) 1 x Per Week/30 Days ary Discharge Instructions: Apply to wound bed as instructed Secondary Dressing: Optifoam Non-Adhesive Dressing, 4x4 in (Generic) 1 x Per Week/30 Days Discharge Instructions: Apply over primary dressing as directed. Secondary Dressing: Woven Gauze Sponges 2x2 in (Generic) 1 x Per Week/30 Days Discharge Instructions: Apply over primary dressing as directed. Secondary Dressing: Zetuvit Absorbent Pad, 4x4 (in/in) 1 x Per Week/30 Days Compression Wrap: Urgo K2, (equivalent to a 4 layer) two layer compression system, regular 1 x Per Week/30 Days Discharge Instructions: Apply Urgo K2 as directed (alternative to 4 layer compression). Patient Medications llergies: No Known Drug Allergies A Notifications Medication Indication Start End 03/14/2023 lidocaine DOSE topical 4 % cream - cream topical Electronic Signature(s) Signed: 03/14/2023 11:42:07 AM By: [name] MD FACS Entered By: [name] on 03/14/2023 07:36:37 -------------------------------------------------------------------------------- Problem List Details Patient Name: Date of Service: [name]. 03/14/2023 10:00 A M Medical Record Number: [id number] Patient Account Number: [id number] Date of Birth/Sex: Treating RN: 30-Apr-1951 (72 y.o. 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 08/26/2022 No Yes severity I87.322 Chronic venous hypertension (idiopathic) with inflammation of left lower 04/12/2021 No Yes extremity E11.51 Type 2 diabetes mellitus  with diabetic peripheral angiopathy without gangrene 04/12/2021 No Yes I89.0 Lymphedema, not elsewhere classified 04/12/2021 No Yes L85.9 Epidermal thickening, unspecified 08/30/2022 No Yes Inactive Problems ICD-10 Code Description Active Date Inactive Date E11.621 Type 2 diabetes mellitus with foot ulcer 04/12/2021 04/12/2021 E11.42 Type 2 diabetes mellitus with diabetic polyneuropathy 04/12/2021 04/12/2021 L02.416 Cutaneous abscess of left lower limb 06/13/2021 06/13/2021 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 M) Primary Care Provider: [name] Other Clinician: Referring Provider: Treating Provider/Extender: [name] in Treatment: 100 Information Obtained from: Patient Chief Complaint Left leg and foot ulcers 04/12/2021; patient is here for wounds on his left lower leg and left plantar foot over the first metatarsal head Electronic Signature(s) Signed: 03/14/2023 10:33:07 AM By: [name] MD FACS Entered By: [name] on 03/14/2023 07:33:07 -------------------------------------------------------------------------------- Debridement Details Patient Name: Date of Service: [name]. 03/14/2023 10:00 A M Medical Record Number: [id number] Patient Account Number: [id number] Date of Birth/Sex: Treating RN: 17-Nov-1950 (72 y.o. [name] Primary Care Provider: [name] Other Clinician: Referring Provider: Treating Provider/Extender: [name] in Treatment: 100 Debridement Performed for Assessment: Wound #18R Left,Plantar Metatarsal head first Performed By: Physician [name], MD The following information was scribed by: [name] The information was scribed for: [name] Debridement Type: Debridement Severity of Tissue Pre Debridement: Fat layer exposed Level of Consciousness (Pre-procedure): Awake and Alert Pre-procedure Verification/Time Out Yes - 10:15 Taken: Start Time: 10:15 Pain Control: Lidocaine 4% T opical Solution Percent of Wound Bed Debrided: 100% T Area Debrided (cm): otal 1.88 Tissue and other material debrided: Non-Viable, Slough, Skin:  Epidermis, Slough Level: Skin/Epidermis Debridement Description: Selective/Open Wound Instrument: Curette Bleeding: Minimum Hemostasis Achieved: Pressure Response to Treatment: Procedure was tolerated well Level of Consciousness (Post- Awake and Alert procedure): Post Debridement Measurements of Total Wound Length: (cm) 1.2 Width: (cm) 2 Depth: (cm) 0.1 Volume: (cm) 0.188 Character of Wound/Ulcer Post Debridement: Improved [name] ([id number]) [id number] Page 2 of 21 Severity of Tissue Post Debridement: Fat layer exposed Post Procedure Diagnosis Same as Pre-procedure Electronic Signature(s) Signed: 03/14/2023 11:42:07 AM By: [name] MD FACS Signed: 03/14/2023 3:46:51 PM By: [name] Entered By: [name] on 03/14/2023 07:29:10 -------------------------------------------------------------------------------- HPI Details Patient Name: Date of Service: [name]. 03/14/2023 10:00 A M Medical Record Number: [id number] Patient Account Number: [id number] Date of Birth/Sex: Treating RN: [birth year] (72 y.o. M) Primary Care Provider: [name] Other Clinician: Referring Provider: Treating Provider/Extender: [name] in Treatment: 100 History of Present Illness HPI Description: 10/11/17; Mr. Errante is a 72 year old man who tells me that in 2015 he slipped down the latter traumatizing his left leg. He developed a wound in the same spot the area that we are currently looking at. He states this closed over for the most part although he always felt it was somewhat unstable. In 2016 he hit the same area with the door of his car had this reopened. He tells me that this is never really closed although sometimes an inflow it remains open on a constant basis. He has not been using any specific dressing to this except for topical antibiotics the nature of which were not really sure. His primary doctor did  send him to see Dr. Jacinto Halim of interventional cardiology. He underwent an angiogram on 08/06/17 and he underwent a PTA and directional atherectomy of the lesser distal SFA and popliteal arteries which resulted in brisk improvement in blood flow. It was noted that he had 2 vessel runoff through the anterior tibial and peroneal. He is also been to see vascular and interventional radiologist. He was  Former smoker - quit 1999; Marital Status - Married; Alcohol Use: Moderate; Drug Use: No History; Caffeine Use: Rarely; Financial Concerns: No; Food, Clothing or Shelter Needs: No; Support System Lacking: No; Transportation Concerns: No Electronic Signature(s) Signed: 03/14/2023 11:42:07 AM By: [name] MD FACS Entered By: [name] on 03/14/2023 07:34:50 -------------------------------------------------------------------------------- SuperBill Details Patient Name: Date of Service: [name]. 03/14/2023 Medical Record  Number: [id number] Patient Account Number: [id number] Date of Birth/Sex: Treating RN: 06/13/1951 (72 y.o. 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 I89.0 Lymphedema, not elsewhere classified L85.9 Epidermal thickening, unspecified Facility Procedures : CPT4 Code: [id number] Description: 97597 - DEBRIDE WOUND 1ST 20 SQ CM OR < ICD-10 Diagnosis Description L97.528 Non-pressure chronic ulcer of other part of left foot with other specified severi Modifier: ty Quantity: 1 Physician Procedures : CPT4 Code Description Modifier [id number] 99214 - WC PHYS LEVEL 4 - EST PT 25 ICD-10 Diagnosis Description L97.528 Non-pressure chronic ulcer of other part of left foot with other specified severity I87.322 Chronic venous hypertension (idiopathic) with  inflammation of left lower extremity E11.51 Type 2 diabetes mellitus with diabetic peripheral angiopathy without gangrene I89.0 Lymphedema, not elsewhere classified Quantity: 1 : [phone] 97597 - WC PHYS DEBR WO ANESTH 20 SQ CM ICD-10 Diagnosis Description L97.528 Non-pressure chronic ulcer of other part of left foot with other specified severity Quantity: 1 Electronic Signature(s) Signed: 03/14/2023 10:38:56 AM By: [name] MD FACS Entered By: [name] on 03/14/2023 07:38:55

## 2023-03-21 ENCOUNTER — Encounter (HOSPITAL_BASED_OUTPATIENT_CLINIC_OR_DEPARTMENT_OTHER): Payer: Medicare Other | Admitting: General Surgery

## 2023-03-21 DIAGNOSIS — E11622 Type 2 diabetes mellitus with other skin ulcer: Secondary | ICD-10-CM | POA: Diagnosis not present

## 2023-03-25 NOTE — Progress Notes (Signed)
Of Treatment: 101 History: Peripheral Venous Disease, Type II Diabetes, Gout, Osteoarthritis, Clustered Wound: No Neuropathy Wound Measurements Length: (cm) 1.2 Width: (cm) 2 Depth: (cm) 0.2 Area: (cm) 1.885 Volume: (cm) 0.377 % Reduction in Area: 86.7% % Reduction in Volume: 86.7% Epithelialization: Small (1-33%) Tunneling: No Undermining: No Wound Description Classification: Grade 2 Wound Margin: Epibole Exudate Amount: Medium Exudate Type: Serosanguineous Exudate Color: red, brown Foul Odor After Cleansing: No Slough/Fibrino Yes Wound Bed Granulation Amount: Large (67-100%) Exposed Structure Granulation Quality: Red Fascia Exposed: No Necrotic Amount: Small (1-33%) Fat Layer (Subcutaneous Tissue) Exposed: Yes Necrotic Quality: Adherent Slough Tendon Exposed: No Muscle Exposed: No Joint Exposed: No Bone Exposed: No Periwound Skin Texture Texture Color No Abnormalities Noted: No No Abnormalities Noted:  Yes Callus: Yes Temperature / Pain Temperature: No Abnormality Moisture No Abnormalities Noted: No Dry / Scaly: Yes Treatment Notes Wound #18R (Metatarsal head first) Wound Laterality: Plantar, Left Cleanser Soap and Water Discharge Instruction: May shower and wash wound with dial antibacterial soap and water prior to dressing change. Wound Cleanser Discharge Instruction: Cleanse the wound with wound cleanser prior to applying a clean dressing using gauze sponges, not tissue or cotton balls. Peri-Wound Care Ketoconazole Cream 2% Discharge Instruction: Apply Ketoconazole as directed Triamcinolone 15 (g) Discharge Instruction: Use triamcinolone 15 (g) as directed Topical Gentamicin Discharge Instruction: As directed by physician Mupirocin Ointment Discharge Instruction: Apply Mupirocin (Bactroban) as instructed Primary Dressing Maxorb Extra Ag+ Alginate Dressing, 2x2 (in/in) Discharge Instruction: Apply to wound bed as instructed Secondary Dressing Optifoam Non-Adhesive Dressing, 4x4 in Discharge Instruction: Apply over primary dressing as directed. Woven Gauze Sponges 2x2 in Discharge Instruction: Apply over primary dressing as directed. Zetuvit Absorbent Pad, 4x4 (in/in) Secured With Compression [name], [name] ([id number]) [id number] Page 4 of 4 Urgo K2, (equivalent to a 4 layer) two layer compression system, regular Discharge Instruction: Apply Urgo K2 as directed (alternative to 4 layer compression). Compression Stockings Add-Ons Electronic Signature(s) Signed: 03/25/2023 2:03:22 PM By: Brenton Grills Entered By: Brenton Grills on 03/21/2023 10:19:08 -------------------------------------------------------------------------------- Vitals Details Patient Name: Date of Service: [name]. 03/21/2023 10:00 A M Medical Record Number: [id number] Patient Account Number: [id number] Date of Birth/Sex: Treating RN: 1950-08-30 (72 y.o. [name] Primary Care Austynn Pridmore: [name] Other Clinician: Referring Nickalous Stingley: Treating Nashon Erbes/Extender: [name] in Treatment: 101 Vital Signs Time Taken: 10:00 Temperature (F): 98.1 Height (in): 74 Pulse (bpm): 52 Weight (lbs): 238 Respiratory Rate (breaths/min): 18 Body Mass Index (BMI): 30.6 Blood Pressure (mmHg): 138/77 Capillary Blood Glucose (mg/dl): 782 Reference Range: 80 - 120 mg / dl Electronic Signature(s) Signed: 03/25/2023 2:03:22 PM By: Brenton Grills Entered By: Brenton Grills on 03/21/2023 10:18:32  [name], [name] ([id number]) [id number] Page 1 of 4 Visit Report for 03/21/2023 Arrival Information Details Patient Name: Date of Service: [name], [name] 03/21/2023 10:00 A M Medical Record Number: [id number] Patient Account Number: [id number] Date of Birth/Sex: Treating RN: Apr 14, 1951 (72 y.o. [name] Primary Care Chalese Peach: [name] Other Clinician: Referring Lekendrick Alpern: Treating Trendon Zaring/Extender: [name] in Treatment: 101 Visit Information History Since Last Visit All ordered tests and consults were completed: Yes Patient Arrived: Ambulatory Added or deleted any medications: No Arrival Time: 10:17 Any new allergies or adverse reactions: No Accompanied By: self Had a fall or experienced change in No Transfer Assistance: None activities of daily living that may affect Patient Identification Verified: Yes risk of falls: Secondary Verification Process Completed: Yes Signs or symptoms of abuse/neglect since last visito No Patient Requires Transmission-Based Precautions: No Hospitalized since last visit: No Patient Has Alerts: Yes Implantable device outside of the clinic excluding No cellular tissue based products placed in the center since last visit: Has Dressing in Place as Prescribed: Yes Pain Present Now: No Electronic Signature(s) Signed: 03/25/2023 2:03:22 PM By: Brenton Grills Entered By: Brenton Grills on 03/21/2023 10:17:59 -------------------------------------------------------------------------------- Compression Therapy Details Patient Name: Date of Service: [name]. 03/21/2023 10:00 A M Medical Record Number: [id number] Patient Account Number: [id number] Date of Birth/Sex: Treating RN: May 31, 1951 (72 y.o. [name] Primary Care Tanda Morrissey: [name] Other Clinician: Referring Rolando Whitby: Treating Kylee Umana/Extender: [name] in Treatment: 101 Compression  Therapy Performed for Wound Assessment: Wound #18R Left,Plantar Metatarsal head first Performed By: Clinician Brenton Grills, RN Compression Type: Four Layer Electronic Signature(s) Signed: 03/25/2023 2:03:22 PM By: Brenton Grills Entered By: Brenton Grills on 03/21/2023 10:19:35 -------------------------------------------------------------------------------- Encounter Discharge Information Details Patient Name: Date of Service: [name]. 03/21/2023 10:00 A M Medical Record Number: [id number] Patient Account Number: [id number] [name], [name] ([id number]) [id number] Page 2 of 4 Date of Birth/Sex: Treating RN: October 02, 1950 (72 y.o. [name] Primary Care Lian Pounds: Other Clinician: [name] Referring Shia Delaine: Treating Johnita Palleschi/Extender: [name] in Treatment: [phone] Encounter Discharge Information Items Discharge Condition: Stable Ambulatory Status: Ambulatory Discharge Destination: Home Transportation: Private Auto Accompanied By: self Schedule Follow-up Appointment: Yes Clinical Summary of Care: Patient Declined Electronic Signature(s) Signed: 03/25/2023 2:03:22 PM By: Brenton Grills Entered By: Brenton Grills on 03/21/2023 10:20:27 -------------------------------------------------------------------------------- Patient/Caregiver Education Details Patient Name: Date of Service: [name], [name] 9/26/2024andnbsp10:00 A M Medical Record Number: [id number] Patient Account Number: [id number] Date of Birth/Gender: Treating RN: 12-07-1950 (72 y.o. [name] Primary Care Physician: [name] Other Clinician: Referring Physician: Treating Physician/Extender: [name] in Treatment: [phone] Education Assessment Education Provided To: Patient Education Topics Provided Wound/Skin Impairment: Methods: Explain/Verbal Responses: State content correctly Electronic Signature(s) Signed:  03/25/2023 2:03:22 PM By: Brenton Grills Entered By: Brenton Grills on 03/21/2023 10:20:07 -------------------------------------------------------------------------------- Wound Assessment Details Patient Name: Date of Service: [name]. 03/21/2023 10:00 A M Medical Record Number: [id number] Patient Account Number: [id number] Date of Birth/Sex: Treating RN: 12/18/1950 (72 y.o. [name] Primary Care Darielys Giglia: [name] Other Clinician: Referring Desmen Schoffstall: Treating Ahsley Attwood/Extender: [name] in Treatment: 101 Wound Status Wound Number: 18R Primary Diabetic Wound/Ulcer of the Lower Extremity Etiology: Wound Location: Left, Plantar Metatarsal head first Wound Open Wounding Event: Gradually Appeared Status: Date Acquired: 08/23/2020 [name] ([id number]) [id number] Page 3 of 4 Date Acquired: 08/23/2020 Comorbid Glaucoma, Sleep Apnea, Hypertension, Peripheral Arterial Disease, Weeks

## 2023-03-25 NOTE — Progress Notes (Signed)
JAIKOB, BORGWARDT ([id number]) [id number] Page 1 of 1 Visit Report for 03/21/2023 SuperBill Details Patient Name: Date of Service: [name], [name] 03/21/2023 Medical Record Number: [id number] Patient Account Number: [id number] Date of Birth/Sex: Treating RN: 1951-02-05 (71 y.o. 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 E11.51 Type 2 diabetes mellitus with diabetic peripheral angiopathy without gangrene I89.0 Lymphedema, not elsewhere classified L85.9 Epidermal thickening, unspecified Facility Procedures CPT4 Code Description Modifier Quantity [id number] (Facility Use Only) [phone] - APPLY MULTLAY COMPRS LWR LT LEG 1 ICD-10 Diagnosis Description L97.528 Non-pressure chronic ulcer of other part of left foot with other specified severity Electronic Signature(s) Signed: 03/21/2023 10:32:36 AM By: [name] MD FACS Signed: 03/25/2023 2:03:22 PM By: Brenton Grills Entered By: Brenton Grills on 03/21/2023 10:21:04

## 2023-03-28 ENCOUNTER — Encounter (HOSPITAL_BASED_OUTPATIENT_CLINIC_OR_DEPARTMENT_OTHER): Payer: Medicare Other | Attending: General Surgery | Admitting: General Surgery

## 2023-03-28 DIAGNOSIS — I890 Lymphedema, not elsewhere classified: Secondary | ICD-10-CM | POA: Insufficient documentation

## 2023-03-28 DIAGNOSIS — L859 Epidermal thickening, unspecified: Secondary | ICD-10-CM | POA: Insufficient documentation

## 2023-03-28 DIAGNOSIS — E1151 Type 2 diabetes mellitus with diabetic peripheral angiopathy without gangrene: Secondary | ICD-10-CM | POA: Insufficient documentation

## 2023-03-28 DIAGNOSIS — Z794 Long term (current) use of insulin: Secondary | ICD-10-CM | POA: Diagnosis not present

## 2023-03-28 DIAGNOSIS — L97528 Non-pressure chronic ulcer of other part of left foot with other specified severity: Secondary | ICD-10-CM | POA: Diagnosis present

## 2023-03-28 DIAGNOSIS — I87322 Chronic venous hypertension (idiopathic) with inflammation of left lower extremity: Secondary | ICD-10-CM | POA: Insufficient documentation

## 2023-03-28 DIAGNOSIS — L97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: Secondary | ICD-10-CM | POA: Diagnosis not present

## 2023-03-28 DIAGNOSIS — E11621 Type 2 diabetes mellitus with foot ulcer: Secondary | ICD-10-CM | POA: Diagnosis not present

## 2023-03-28 NOTE — Progress Notes (Signed)
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 directed. Secured With: Elastic Bandage 4  inch (ACE bandage) 1 x Per Day/30 Days Discharge Instructions: Secure with ACE bandage as directed. Secured With: [employer], 4.5x3.1 (in/yd) 1 x Per Day/30 Days Discharge Instructions: Secure with Kerlix as directed. Patient Medications llergies: No Known Drug Allergies A Notifications Medication Indication Start End 03/28/2023 lidocaine DOSE topical 4 % cream - cream topical Electronic Signature(s) Signed: 03/28/2023 12:55:03 PM By: [name] MD FACS Entered By: [name] on 03/28/2023 07:35:38 Problem List Details -------------------------------------------------------------------------------- [name] ([id number]) [id number] Page 11 of 22 Patient Name: Date of Service: [name], [name] 03/28/2023 10:00 A M Medical Record Number: [id number] Patient Account Number: [id number] Date of Birth/Sex: Treating RN: 12-14-1950 (72 y.o. 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 lower 04/12/2021 No Yes extremity E11.51 Type 2 diabetes mellitus with diabetic peripheral angiopathy without gangrene 04/12/2021 No Yes I89.0 Lymphedema, not elsewhere classified 04/12/2021 No Yes L85.9 Epidermal thickening, unspecified 08/30/2022 No Yes Inactive Problems ICD-10 Code Description Active Date Inactive Date E11.621 Type 2 diabetes mellitus with foot ulcer 04/12/2021 04/12/2021 E11.42 Type 2 diabetes mellitus with diabetic polyneuropathy 04/12/2021 04/12/2021 L02.416 Cutaneous  abscess of left lower limb 06/13/2021 06/13/2021 Resolved Problems ICD-10 Code Description Active Date Resolved Date L97.828 Non-pressure chronic ulcer of other part of left lower leg with other specified severity 04/12/2021 04/12/2021 G40.102 Non-pressure chronic ulcer of other part of left lower leg with other specified severity 01/10/2023 06/08/2022 Electronic Signature(s) Signed: 03/28/2023 10:31:49 AM By: [name] MD FACS Entered By: [name] on 03/28/2023 07:31:48 [name] ([id number]) [id number] Page 12 of 22 -------------------------------------------------------------------------------- Progress Note Details Patient Name: Date of Service: [name], [name] 03/28/2023 10:00 A M Medical Record Number: [id number] Patient Account Number: [id number] Date of Birth/Sex: Treating RN: 17-Jun-1951 (72 y.o. M) Primary Care Provider: [name] Other Clinician: Referring Provider: Treating Provider/Extender: [name] in Treatment: 102 Subjective Chief Complaint  03/28/2023 07:33:18 -------------------------------------------------------------------------------- Physical Exam Details Patient Name: Date of Service: [name], [name] 03/28/2023 10:00 A M Medical Record Number: [id number] Patient Account Number: [id number] Date of Birth/Sex: Treating RN: 07/27/1950 (72 y.o. M) Primary Care Provider: [name] Other Clinician: Referring Provider: Treating Provider/Extender: [name] in Treatment: [address], [place] ([id number]) [id number] Page 9 of 22 Constitutional . . . . no acute distress. Respiratory Normal work of breathing on room air. Notes 03/28/2023: The lateral leg wound has reopened. There are 2 small openings that are fairly superficial with some surrounding dry skin. The foot wound shows signs of pressure-related trauma with some bruising and increased callus. Electronic Signature(s) Signed: 03/28/2023 10:35:19 AM By: [name] MD FACS Entered By: [name] on 03/28/2023 07:35:18 -------------------------------------------------------------------------------- Physician Orders Details Patient Name: Date of Service: [name] Grapes. 03/28/2023 10:00 A M Medical Record Number: [id number] Patient Account Number: [id number] Date of Birth/Sex: Treating RN: 1950/09/19 (72 y.o. 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 - Dr Lady Gary - Room 2 Return A Anesthetic (In clinic) Topical Lidocaine 4% applied to wound bed Bathing/ Shower/ Hygiene May shower with protection but do not get wound dressing(s) wet. Protect dressing(s) with water repellant cover (for example, large plastic bag) or a cast cover and may then take shower. Edema Control - Lymphedema / SCD / Other Elevate legs to the level of the heart or above for 30 minutes daily and/or when sitting for 3-4 times a day throughout the day. Avoid standing for long periods of time. Patient to wear own compression stockings every day. - both legs daily Moisturize legs daily. Compression stocking or Garment 20-30 mm/Hg pressure to: Off-Loading Wound #18R Left,Plantar Metatarsal head first Open toe surgical shoe to: - Front off loader Left ft Other: - minimal weight bearing left foot Additional Orders / Instructions Follow Nutritious Diet - vitamin C 500 mg 3 times a day and zinc 30-50 mg a day Wound Treatment Wound #18R - Metatarsal head first Wound Laterality: Plantar, Left Cleanser: Soap and Water 1 x Per Week/30 Days Discharge Instructions: May shower and wash wound with dial antibacterial soap and water prior to dressing change. [name], [name] ([id number]) [id number] Page 10 of 22 Cleanser: Wound Cleanser 1 x Per Week/30 Days Discharge Instructions: Cleanse the wound with wound cleanser prior to applying a clean dressing using gauze sponges, not tissue or cotton balls. Peri-Wound Care: Ketoconazole Cream 2% 1  x Per Week/30 Days Discharge Instructions: Apply Ketoconazole as directed Peri-Wound Care: Triamcinolone 15 (g) 1 x Per Week/30 Days Discharge Instructions: Use triamcinolone 15 (g) as directed Topical: Gentamicin 1 x Per Week/30 Days Discharge Instructions: As directed by physician Topical: Mupirocin Ointment 1 x Per Week/30 Days Discharge Instructions: Apply Mupirocin (Bactroban) as instructed Prim Dressing: Maxorb Extra Ag+ Alginate Dressing, 2x2 (in/in) 1 x Per Week/30 Days ary Discharge Instructions: Apply to wound  RHYATT, [name] ([id number]) [id number] Page 1 of 22 Visit Report for 03/28/2023 Chief Complaint Document Details Patient Name: Date of Service: [name], [name] 03/28/2023 10:00 A M Medical Record Number: [id number] Patient Account Number: [id number] Date of Birth/Sex: Treating RN: 01-May-1951 (72 y.o. M) Primary Care Provider: [name] Other Clinician: Referring Provider: Treating Provider/Extender: [name] in Treatment: 102 Information Obtained from: Patient Chief Complaint Left leg and foot ulcers 04/12/2021; patient is here for wounds on his left lower leg and left plantar foot over the first metatarsal head Electronic Signature(s) Signed: 03/28/2023 10:32:08 AM By: [name] MD FACS Entered By: [name] on 03/28/2023 07:32:08 -------------------------------------------------------------------------------- Debridement Details Patient Name: Date of Service: [name] Grapes. 03/28/2023 10:00 A M Medical Record Number: [id number] Patient Account Number: [id number] Date of Birth/Sex: Treating RN: January 03, 1951 (72 y.o. [name] Primary Care Provider: [name] Other Clinician: Referring Provider: Treating Provider/Extender: [name] in Treatment: 102 Debridement Performed for Assessment: Wound #22R Left,Proximal,Lateral Lower Leg Performed By: Physician [name], MD The following information was scribed by: [name] The information was scribed for: [name] Debridement Type: Debridement Level of Consciousness (Pre-procedure): Awake and Alert Pre-procedure Verification/Time Out Yes - 10:09 Taken: Start Time: 10:09 Pain Control: Lidocaine 4% Topical Solution Percent of Wound Bed Debrided: 100% T Area Debrided (cm): otal 0.6 Tissue and other material debrided: Non-Viable, Eschar, Slough, Slough Level: Non-Viable Tissue Debridement Description:  Selective/Open Wound Instrument: Curette Bleeding: Minimum Hemostasis Achieved: Pressure Response to Treatment: Procedure was tolerated well Level of Consciousness (Post- Awake and Alert procedure): Post Debridement Measurements of Total Wound Length: (cm) 1.9 Width: (cm) 0.4 Depth: (cm) 0.1 Volume: (cm) 0.06 Character of Wound/Ulcer Post Debridement: Improved [name] ([id number]) [id number] Page 2 of 22 Post Procedure Diagnosis Same as Pre-procedure Electronic Signature(s) Signed: 03/28/2023 12:55:03 PM By: [name] MD FACS Signed: 03/28/2023 3:36:57 PM By: [name] Entered By: [name] on 03/28/2023 07:13:38 -------------------------------------------------------------------------------- Debridement Details Patient Name: Date of Service: [name] Grapes. 03/28/2023 10:00 A M Medical Record Number: [id number] Patient Account Number: [id number] Date of Birth/Sex: Treating RN: 1950/10/08 (72 y.o. [name] Primary Care Provider: [name] Other Clinician: Referring Provider: Treating Provider/Extender: [name] in Treatment: 102 Debridement Performed for Assessment: Wound #18R Left,Plantar Metatarsal head first Performed By: Physician [name], MD The following information was scribed by: [name] The information was scribed for: [name] Debridement Type: Debridement Severity of Tissue Pre Debridement: Fat layer exposed Level of Consciousness (Pre-procedure): Awake and Alert Pre-procedure Verification/Time Out Yes - 10:09 Taken: Start Time: 10:09 Pain Control: Lidocaine 4% Topical Solution Percent of Wound Bed Debrided: 100% T Area Debrided (cm): otal 2.09 Tissue and other material debrided: Non-Viable, Callus, Slough, Skin: Epidermis, Slough Level: Skin/Epidermis Debridement Description: Selective/Open Wound Instrument: Curette Bleeding:  Minimum Hemostasis Achieved: Pressure Response to Treatment: Procedure was tolerated well Level of Consciousness (Post- Awake and Alert procedure): Post Debridement Measurements of Total Wound Length: (cm) 1.4 Width: (cm) 1.9 Depth: (cm) 0.2 Volume: (cm) 0.418 Character of Wound/Ulcer Post Debridement: Improved Severity of Tissue Post Debridement: Fat layer exposed Post Procedure Diagnosis Same as Pre-procedure Electronic Signature(s) Signed: 03/28/2023 12:55:03 PM By: [name] MD FACS Signed: 03/28/2023 3:36:57 PM By: [name] Entered By: [name] on 03/28/2023 07:14:25 HPI Details -------------------------------------------------------------------------------- [name] ([id number]) [id number] Page 3 of 22 Patient Name: Date of Service: [name], ALBERS. 03/28/2023 10:00 A M Medical  RHYATT, [name] ([id number]) [id number] Page 1 of 22 Visit Report for 03/28/2023 Chief Complaint Document Details Patient Name: Date of Service: [name], [name] 03/28/2023 10:00 A M Medical Record Number: [id number] Patient Account Number: [id number] Date of Birth/Sex: Treating RN: 01-May-1951 (72 y.o. M) Primary Care Provider: [name] Other Clinician: Referring Provider: Treating Provider/Extender: [name] in Treatment: 102 Information Obtained from: Patient Chief Complaint Left leg and foot ulcers 04/12/2021; patient is here for wounds on his left lower leg and left plantar foot over the first metatarsal head Electronic Signature(s) Signed: 03/28/2023 10:32:08 AM By: [name] MD FACS Entered By: [name] on 03/28/2023 07:32:08 -------------------------------------------------------------------------------- Debridement Details Patient Name: Date of Service: [name] Grapes. 03/28/2023 10:00 A M Medical Record Number: [id number] Patient Account Number: [id number] Date of Birth/Sex: Treating RN: January 03, 1951 (72 y.o. [name] Primary Care Provider: [name] Other Clinician: Referring Provider: Treating Provider/Extender: [name] in Treatment: 102 Debridement Performed for Assessment: Wound #22R Left,Proximal,Lateral Lower Leg Performed By: Physician [name], MD The following information was scribed by: [name] The information was scribed for: [name] Debridement Type: Debridement Level of Consciousness (Pre-procedure): Awake and Alert Pre-procedure Verification/Time Out Yes - 10:09 Taken: Start Time: 10:09 Pain Control: Lidocaine 4% Topical Solution Percent of Wound Bed Debrided: 100% T Area Debrided (cm): otal 0.6 Tissue and other material debrided: Non-Viable, Eschar, Slough, Slough Level: Non-Viable Tissue Debridement Description:  Selective/Open Wound Instrument: Curette Bleeding: Minimum Hemostasis Achieved: Pressure Response to Treatment: Procedure was tolerated well Level of Consciousness (Post- Awake and Alert procedure): Post Debridement Measurements of Total Wound Length: (cm) 1.9 Width: (cm) 0.4 Depth: (cm) 0.1 Volume: (cm) 0.06 Character of Wound/Ulcer Post Debridement: Improved [name] ([id number]) [id number] Page 2 of 22 Post Procedure Diagnosis Same as Pre-procedure Electronic Signature(s) Signed: 03/28/2023 12:55:03 PM By: [name] MD FACS Signed: 03/28/2023 3:36:57 PM By: [name] Entered By: [name] on 03/28/2023 07:13:38 -------------------------------------------------------------------------------- Debridement Details Patient Name: Date of Service: [name] Grapes. 03/28/2023 10:00 A M Medical Record Number: [id number] Patient Account Number: [id number] Date of Birth/Sex: Treating RN: 1950/10/08 (72 y.o. [name] Primary Care Provider: [name] Other Clinician: Referring Provider: Treating Provider/Extender: [name] in Treatment: 102 Debridement Performed for Assessment: Wound #18R Left,Plantar Metatarsal head first Performed By: Physician [name], MD The following information was scribed by: [name] The information was scribed for: [name] Debridement Type: Debridement Severity of Tissue Pre Debridement: Fat layer exposed Level of Consciousness (Pre-procedure): Awake and Alert Pre-procedure Verification/Time Out Yes - 10:09 Taken: Start Time: 10:09 Pain Control: Lidocaine 4% Topical Solution Percent of Wound Bed Debrided: 100% T Area Debrided (cm): otal 2.09 Tissue and other material debrided: Non-Viable, Callus, Slough, Skin: Epidermis, Slough Level: Skin/Epidermis Debridement Description: Selective/Open Wound Instrument: Curette Bleeding:  Minimum Hemostasis Achieved: Pressure Response to Treatment: Procedure was tolerated well Level of Consciousness (Post- Awake and Alert procedure): Post Debridement Measurements of Total Wound Length: (cm) 1.4 Width: (cm) 1.9 Depth: (cm) 0.2 Volume: (cm) 0.418 Character of Wound/Ulcer Post Debridement: Improved Severity of Tissue Post Debridement: Fat layer exposed Post Procedure Diagnosis Same as Pre-procedure Electronic Signature(s) Signed: 03/28/2023 12:55:03 PM By: [name] MD FACS Signed: 03/28/2023 3:36:57 PM By: [name] Entered By: [name] on 03/28/2023 07:14:25 HPI Details -------------------------------------------------------------------------------- [name] ([id number]) [id number] Page 3 of 22 Patient Name: Date of Service: [name], ALBERS. 03/28/2023 10:00 A M Medical  RHYATT, [name] ([id number]) [id number] Page 1 of 22 Visit Report for 03/28/2023 Chief Complaint Document Details Patient Name: Date of Service: [name], [name] 03/28/2023 10:00 A M Medical Record Number: [id number] Patient Account Number: [id number] Date of Birth/Sex: Treating RN: 01-May-1951 (72 y.o. M) Primary Care Provider: [name] Other Clinician: Referring Provider: Treating Provider/Extender: [name] in Treatment: 102 Information Obtained from: Patient Chief Complaint Left leg and foot ulcers 04/12/2021; patient is here for wounds on his left lower leg and left plantar foot over the first metatarsal head Electronic Signature(s) Signed: 03/28/2023 10:32:08 AM By: [name] MD FACS Entered By: [name] on 03/28/2023 07:32:08 -------------------------------------------------------------------------------- Debridement Details Patient Name: Date of Service: [name] Grapes. 03/28/2023 10:00 A M Medical Record Number: [id number] Patient Account Number: [id number] Date of Birth/Sex: Treating RN: January 03, 1951 (72 y.o. [name] Primary Care Provider: [name] Other Clinician: Referring Provider: Treating Provider/Extender: [name] in Treatment: 102 Debridement Performed for Assessment: Wound #22R Left,Proximal,Lateral Lower Leg Performed By: Physician [name], MD The following information was scribed by: [name] The information was scribed for: [name] Debridement Type: Debridement Level of Consciousness (Pre-procedure): Awake and Alert Pre-procedure Verification/Time Out Yes - 10:09 Taken: Start Time: 10:09 Pain Control: Lidocaine 4% Topical Solution Percent of Wound Bed Debrided: 100% T Area Debrided (cm): otal 0.6 Tissue and other material debrided: Non-Viable, Eschar, Slough, Slough Level: Non-Viable Tissue Debridement Description:  Selective/Open Wound Instrument: Curette Bleeding: Minimum Hemostasis Achieved: Pressure Response to Treatment: Procedure was tolerated well Level of Consciousness (Post- Awake and Alert procedure): Post Debridement Measurements of Total Wound Length: (cm) 1.9 Width: (cm) 0.4 Depth: (cm) 0.1 Volume: (cm) 0.06 Character of Wound/Ulcer Post Debridement: Improved [name] ([id number]) [id number] Page 2 of 22 Post Procedure Diagnosis Same as Pre-procedure Electronic Signature(s) Signed: 03/28/2023 12:55:03 PM By: [name] MD FACS Signed: 03/28/2023 3:36:57 PM By: [name] Entered By: [name] on 03/28/2023 07:13:38 -------------------------------------------------------------------------------- Debridement Details Patient Name: Date of Service: [name] Grapes. 03/28/2023 10:00 A M Medical Record Number: [id number] Patient Account Number: [id number] Date of Birth/Sex: Treating RN: 1950/10/08 (72 y.o. [name] Primary Care Provider: [name] Other Clinician: Referring Provider: Treating Provider/Extender: [name] in Treatment: 102 Debridement Performed for Assessment: Wound #18R Left,Plantar Metatarsal head first Performed By: Physician [name], MD The following information was scribed by: [name] The information was scribed for: [name] Debridement Type: Debridement Severity of Tissue Pre Debridement: Fat layer exposed Level of Consciousness (Pre-procedure): Awake and Alert Pre-procedure Verification/Time Out Yes - 10:09 Taken: Start Time: 10:09 Pain Control: Lidocaine 4% Topical Solution Percent of Wound Bed Debrided: 100% T Area Debrided (cm): otal 2.09 Tissue and other material debrided: Non-Viable, Callus, Slough, Skin: Epidermis, Slough Level: Skin/Epidermis Debridement Description: Selective/Open Wound Instrument: Curette Bleeding:  Minimum Hemostasis Achieved: Pressure Response to Treatment: Procedure was tolerated well Level of Consciousness (Post- Awake and Alert procedure): Post Debridement Measurements of Total Wound Length: (cm) 1.4 Width: (cm) 1.9 Depth: (cm) 0.2 Volume: (cm) 0.418 Character of Wound/Ulcer Post Debridement: Improved Severity of Tissue Post Debridement: Fat layer exposed Post Procedure Diagnosis Same as Pre-procedure Electronic Signature(s) Signed: 03/28/2023 12:55:03 PM By: [name] MD FACS Signed: 03/28/2023 3:36:57 PM By: [name] Entered By: [name] on 03/28/2023 07:14:25 HPI Details -------------------------------------------------------------------------------- [name] ([id number]) [id number] Page 3 of 22 Patient Name: Date of Service: [name], ALBERS. 03/28/2023 10:00 A M Medical  03/28/2023 07:33:18 -------------------------------------------------------------------------------- Physical Exam Details Patient Name: Date of Service: [name], [name] 03/28/2023 10:00 A M Medical Record Number: [id number] Patient Account Number: [id number] Date of Birth/Sex: Treating RN: 07/27/1950 (72 y.o. M) Primary Care Provider: [name] Other Clinician: Referring Provider: Treating Provider/Extender: [name] in Treatment: [address], [place] ([id number]) [id number] Page 9 of 22 Constitutional . . . . no acute distress. Respiratory Normal work of breathing on room air. Notes 03/28/2023: The lateral leg wound has reopened. There are 2 small openings that are fairly superficial with some surrounding dry skin. The foot wound shows signs of pressure-related trauma with some bruising and increased callus. Electronic Signature(s) Signed: 03/28/2023 10:35:19 AM By: [name] MD FACS Entered By: [name] on 03/28/2023 07:35:18 -------------------------------------------------------------------------------- Physician Orders Details Patient Name: Date of Service: [name] Grapes. 03/28/2023 10:00 A M Medical Record Number: [id number] Patient Account Number: [id number] Date of Birth/Sex: Treating RN: 1950/09/19 (72 y.o. 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 - Dr Lady Gary - Room 2 Return A Anesthetic (In clinic) Topical Lidocaine 4% applied to wound bed Bathing/ Shower/ Hygiene May shower with protection but do not get wound dressing(s) wet. Protect dressing(s) with water repellant cover (for example, large plastic bag) or a cast cover and may then take shower. Edema Control - Lymphedema / SCD / Other Elevate legs to the level of the heart or above for 30 minutes daily and/or when sitting for 3-4 times a day throughout the day. Avoid standing for long periods of time. Patient to wear own compression stockings every day. - both legs daily Moisturize legs daily. Compression stocking or Garment 20-30 mm/Hg pressure to: Off-Loading Wound #18R Left,Plantar Metatarsal head first Open toe surgical shoe to: - Front off loader Left ft Other: - minimal weight bearing left foot Additional Orders / Instructions Follow Nutritious Diet - vitamin C 500 mg 3 times a day and zinc 30-50 mg a day Wound Treatment Wound #18R - Metatarsal head first Wound Laterality: Plantar, Left Cleanser: Soap and Water 1 x Per Week/30 Days Discharge Instructions: May shower and wash wound with dial antibacterial soap and water prior to dressing change.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 - Dr Lady Gary - Room 2 Return A Anesthetic (In clinic) Topical Lidocaine 4% applied to wound bed Bathing/ Shower/ Hygiene May shower with protection but do not get wound dressing(s) wet. Protect dressing(s) with water repellant cover (for example, large plastic bag) or a cast cover and may then take shower. Edema Control - Lymphedema / SCD / Other Elevate legs to the level of the heart or above for 30 minutes daily and/or when sitting for 3-4 times a day throughout the day. Avoid standing for long periods of time. Patient to wear own compression stockings every day. - both legs daily Moisturize legs daily. Compression stocking or Garment 20-30 mm/Hg pressure to: Off-Loading Wound #18R Left,Plantar Metatarsal head first Open toe surgical shoe to: - Front off loader Left ft Other: - minimal weight bearing left foot Additional Orders / Instructions Follow Nutritious Diet - vitamin C 500 mg 3 times a day and zinc 30-50 mg a day Wound Treatment Wound #18R - Metatarsal head first Wound Laterality: Plantar, Left Cleanser: Soap and Water 1 x Per Week/30 Days Discharge Instructions: May shower and wash wound with dial antibacterial soap and water prior to dressing change. [name], [name] ([id number]) [id number] Page 10 of 22 Cleanser: Wound Cleanser 1 x Per Week/30 Days Discharge Instructions: Cleanse the wound with wound cleanser prior to applying a clean dressing using gauze sponges, not tissue or cotton balls. Peri-Wound Care: Ketoconazole Cream 2% 1  x Per Week/30 Days Discharge Instructions: Apply Ketoconazole as directed Peri-Wound Care: Triamcinolone 15 (g) 1 x Per Week/30 Days Discharge Instructions: Use triamcinolone 15 (g) as directed Topical: Gentamicin 1 x Per Week/30 Days Discharge Instructions: As directed by physician Topical: Mupirocin Ointment 1 x Per Week/30 Days Discharge Instructions: Apply Mupirocin (Bactroban) as instructed Prim Dressing: Maxorb Extra Ag+ Alginate Dressing, 2x2 (in/in) 1 x Per Week/30 Days ary Discharge Instructions: Apply to wound  bed as instructed Secondary Dressing: Optifoam Non-Adhesive Dressing, 4x4 in (Generic) 1 x Per Week/30 Days Discharge Instructions: Apply over primary dressing as directed. Secondary Dressing: Woven Gauze Sponges 2x2 in (Generic) 1 x Per Week/30 Days Discharge Instructions: Apply over primary dressing as directed. Secondary Dressing: Zetuvit Absorbent Pad, 4x4 (in/in) 1 x Per Week/30 Days Compression Wrap: Urgo K2, (equivalent to a 4 layer) two layer compression system, regular 1 x Per Week/30 Days Discharge Instructions: Apply Urgo K2 as directed (alternative to 4 layer compression). Wound #22R - Lower Leg Wound Laterality: Left, Lateral, Proximal Cleanser: Soap and Water 1 x Per Day/30 Days Discharge Instructions: May shower and wash wound with dial antibacterial soap and water prior to dressing change. Cleanser: Wound Cleanser 1 x Per Day/30 Days Discharge Instructions: Cleanse the wound with wound cleanser prior to applying a clean dressing using gauze sponges, not tissue or cotton balls. Topical: Skintegrity Hydrogel 4 (oz) 1 x Per Day/30 Days Discharge Instructions: Apply hydrogel as directed Prim Dressing: Endoform 2x2 in 1 x Per Day/30 Days ary Discharge Instructions: Moisten with saline Secondary Dressing: Woven Gauze Sponge, Non-Sterile 4x4 in 1 x Per Day/30 Days Discharge Instructions: Apply over primary dressing as directed. Secured With: Elastic Bandage 4  inch (ACE bandage) 1 x Per Day/30 Days Discharge Instructions: Secure with ACE bandage as directed. Secured With: [employer], 4.5x3.1 (in/yd) 1 x Per Day/30 Days Discharge Instructions: Secure with Kerlix as directed. Patient Medications llergies: No Known Drug Allergies A Notifications Medication Indication Start End 03/28/2023 lidocaine DOSE topical 4 % cream - cream topical Electronic Signature(s) Signed: 03/28/2023 12:55:03 PM By: [name] MD FACS Entered By: [name] on 03/28/2023 07:35:38 Problem List Details -------------------------------------------------------------------------------- [name] ([id number]) [id number] Page 11 of 22 Patient Name: Date of Service: [name], [name] 03/28/2023 10:00 A M Medical Record Number: [id number] Patient Account Number: [id number] Date of Birth/Sex: Treating RN: 12-14-1950 (72 y.o. 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 lower 04/12/2021 No Yes extremity E11.51 Type 2 diabetes mellitus with diabetic peripheral angiopathy without gangrene 04/12/2021 No Yes I89.0 Lymphedema, not elsewhere classified 04/12/2021 No Yes L85.9 Epidermal thickening, unspecified 08/30/2022 No Yes Inactive Problems ICD-10 Code Description Active Date Inactive Date E11.621 Type 2 diabetes mellitus with foot ulcer 04/12/2021 04/12/2021 E11.42 Type 2 diabetes mellitus with diabetic polyneuropathy 04/12/2021 04/12/2021 L02.416 Cutaneous  abscess of left lower limb 06/13/2021 06/13/2021 Resolved Problems ICD-10 Code Description Active Date Resolved Date L97.828 Non-pressure chronic ulcer of other part of left lower leg with other specified severity 04/12/2021 04/12/2021 G40.102 Non-pressure chronic ulcer of other part of left lower leg with other specified severity 01/10/2023 06/08/2022 Electronic Signature(s) Signed: 03/28/2023 10:31:49 AM By: [name] MD FACS Entered By: [name] on 03/28/2023 07:31:48 [name] ([id number]) [id number] Page 12 of 22 -------------------------------------------------------------------------------- Progress Note Details Patient Name: Date of Service: [name], [name] 03/28/2023 10:00 A M Medical Record Number: [id number] Patient Account Number: [id number] Date of Birth/Sex: Treating RN: 17-Jun-1951 (72 y.o. M) Primary Care Provider: [name] Other Clinician: Referring Provider: Treating Provider/Extender: [name] in Treatment: 102 Subjective Chief Complaint  03/28/2023 07:33:18 -------------------------------------------------------------------------------- Physical Exam Details Patient Name: Date of Service: [name], [name] 03/28/2023 10:00 A M Medical Record Number: [id number] Patient Account Number: [id number] Date of Birth/Sex: Treating RN: 07/27/1950 (72 y.o. M) Primary Care Provider: [name] Other Clinician: Referring Provider: Treating Provider/Extender: [name] in Treatment: [address], [place] ([id number]) [id number] Page 9 of 22 Constitutional . . . . no acute distress. Respiratory Normal work of breathing on room air. Notes 03/28/2023: The lateral leg wound has reopened. There are 2 small openings that are fairly superficial with some surrounding dry skin. The foot wound shows signs of pressure-related trauma with some bruising and increased callus. Electronic Signature(s) Signed: 03/28/2023 10:35:19 AM By: [name] MD FACS Entered By: [name] on 03/28/2023 07:35:18 -------------------------------------------------------------------------------- Physician Orders Details Patient Name: Date of Service: [name] Grapes. 03/28/2023 10:00 A M Medical Record Number: [id number] Patient Account Number: [id number] Date of Birth/Sex: Treating RN: 1950/09/19 (72 y.o. 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 - Dr Lady Gary - Room 2 Return A Anesthetic (In clinic) Topical Lidocaine 4% applied to wound bed Bathing/ Shower/ Hygiene May shower with protection but do not get wound dressing(s) wet. Protect dressing(s) with water repellant cover (for example, large plastic bag) or a cast cover and may then take shower. Edema Control - Lymphedema / SCD / Other Elevate legs to the level of the heart or above for 30 minutes daily and/or when sitting for 3-4 times a day throughout the day. Avoid standing for long periods of time. Patient to wear own compression stockings every day. - both legs daily Moisturize legs daily. Compression stocking or Garment 20-30 mm/Hg pressure to: Off-Loading Wound #18R Left,Plantar Metatarsal head first Open toe surgical shoe to: - Front off loader Left ft Other: - minimal weight bearing left foot Additional Orders / Instructions Follow Nutritious Diet - vitamin C 500 mg 3 times a day and zinc 30-50 mg a day Wound Treatment Wound #18R - Metatarsal head first Wound Laterality: Plantar, Left Cleanser: Soap and Water 1 x Per Week/30 Days Discharge Instructions: May shower and wash wound with dial antibacterial soap and water prior to dressing change. [name], [name] ([id number]) [id number] Page 10 of 22 Cleanser: Wound Cleanser 1 x Per Week/30 Days Discharge Instructions: Cleanse the wound with wound cleanser prior to applying a clean dressing using gauze sponges, not tissue or cotton balls. Peri-Wound Care: Ketoconazole Cream 2% 1  x Per Week/30 Days Discharge Instructions: Apply Ketoconazole as directed Peri-Wound Care: Triamcinolone 15 (g) 1 x Per Week/30 Days Discharge Instructions: Use triamcinolone 15 (g) as directed Topical: Gentamicin 1 x Per Week/30 Days Discharge Instructions: As directed by physician Topical: Mupirocin Ointment 1 x Per Week/30 Days Discharge Instructions: Apply Mupirocin (Bactroban) as instructed Prim Dressing: Maxorb Extra Ag+ Alginate Dressing, 2x2 (in/in) 1 x Per Week/30 Days ary Discharge Instructions: Apply to wound  RHYATT, [name] ([id number]) [id number] Page 1 of 22 Visit Report for 03/28/2023 Chief Complaint Document Details Patient Name: Date of Service: [name], [name] 03/28/2023 10:00 A M Medical Record Number: [id number] Patient Account Number: [id number] Date of Birth/Sex: Treating RN: 01-May-1951 (72 y.o. M) Primary Care Provider: [name] Other Clinician: Referring Provider: Treating Provider/Extender: [name] in Treatment: 102 Information Obtained from: Patient Chief Complaint Left leg and foot ulcers 04/12/2021; patient is here for wounds on his left lower leg and left plantar foot over the first metatarsal head Electronic Signature(s) Signed: 03/28/2023 10:32:08 AM By: [name] MD FACS Entered By: [name] on 03/28/2023 07:32:08 -------------------------------------------------------------------------------- Debridement Details Patient Name: Date of Service: [name] Grapes. 03/28/2023 10:00 A M Medical Record Number: [id number] Patient Account Number: [id number] Date of Birth/Sex: Treating RN: January 03, 1951 (72 y.o. [name] Primary Care Provider: [name] Other Clinician: Referring Provider: Treating Provider/Extender: [name] in Treatment: 102 Debridement Performed for Assessment: Wound #22R Left,Proximal,Lateral Lower Leg Performed By: Physician [name], MD The following information was scribed by: [name] The information was scribed for: [name] Debridement Type: Debridement Level of Consciousness (Pre-procedure): Awake and Alert Pre-procedure Verification/Time Out Yes - 10:09 Taken: Start Time: 10:09 Pain Control: Lidocaine 4% Topical Solution Percent of Wound Bed Debrided: 100% T Area Debrided (cm): otal 0.6 Tissue and other material debrided: Non-Viable, Eschar, Slough, Slough Level: Non-Viable Tissue Debridement Description:  Selective/Open Wound Instrument: Curette Bleeding: Minimum Hemostasis Achieved: Pressure Response to Treatment: Procedure was tolerated well Level of Consciousness (Post- Awake and Alert procedure): Post Debridement Measurements of Total Wound Length: (cm) 1.9 Width: (cm) 0.4 Depth: (cm) 0.1 Volume: (cm) 0.06 Character of Wound/Ulcer Post Debridement: Improved [name] ([id number]) [id number] Page 2 of 22 Post Procedure Diagnosis Same as Pre-procedure Electronic Signature(s) Signed: 03/28/2023 12:55:03 PM By: [name] MD FACS Signed: 03/28/2023 3:36:57 PM By: [name] Entered By: [name] on 03/28/2023 07:13:38 -------------------------------------------------------------------------------- Debridement Details Patient Name: Date of Service: [name] Grapes. 03/28/2023 10:00 A M Medical Record Number: [id number] Patient Account Number: [id number] Date of Birth/Sex: Treating RN: 1950/10/08 (72 y.o. [name] Primary Care Provider: [name] Other Clinician: Referring Provider: Treating Provider/Extender: [name] in Treatment: 102 Debridement Performed for Assessment: Wound #18R Left,Plantar Metatarsal head first Performed By: Physician [name], MD The following information was scribed by: [name] The information was scribed for: [name] Debridement Type: Debridement Severity of Tissue Pre Debridement: Fat layer exposed Level of Consciousness (Pre-procedure): Awake and Alert Pre-procedure Verification/Time Out Yes - 10:09 Taken: Start Time: 10:09 Pain Control: Lidocaine 4% Topical Solution Percent of Wound Bed Debrided: 100% T Area Debrided (cm): otal 2.09 Tissue and other material debrided: Non-Viable, Callus, Slough, Skin: Epidermis, Slough Level: Skin/Epidermis Debridement Description: Selective/Open Wound Instrument: Curette Bleeding:  Minimum Hemostasis Achieved: Pressure Response to Treatment: Procedure was tolerated well Level of Consciousness (Post- Awake and Alert procedure): Post Debridement Measurements of Total Wound Length: (cm) 1.4 Width: (cm) 1.9 Depth: (cm) 0.2 Volume: (cm) 0.418 Character of Wound/Ulcer Post Debridement: Improved Severity of Tissue Post Debridement: Fat layer exposed Post Procedure Diagnosis Same as Pre-procedure Electronic Signature(s) Signed: 03/28/2023 12:55:03 PM By: [name] MD FACS Signed: 03/28/2023 3:36:57 PM By: [name] Entered By: [name] on 03/28/2023 07:14:25 HPI Details -------------------------------------------------------------------------------- [name] ([id number]) [id number] Page 3 of 22 Patient Name: Date of Service: [name], ALBERS. 03/28/2023 10:00 A M Medical  bed as instructed Secondary Dressing: Optifoam Non-Adhesive Dressing, 4x4 in (Generic) 1 x Per Week/30 Days Discharge Instructions: Apply over primary dressing as directed. Secondary Dressing: Woven Gauze Sponges 2x2 in (Generic) 1 x Per Week/30 Days Discharge Instructions: Apply over primary dressing as directed. Secondary Dressing: Zetuvit Absorbent Pad, 4x4 (in/in) 1 x Per Week/30 Days Compression Wrap: Urgo K2, (equivalent to a 4 layer) two layer compression system, regular 1 x Per Week/30 Days Discharge Instructions: Apply Urgo K2 as directed (alternative to 4 layer compression). Wound #22R - Lower Leg Wound Laterality: Left, Lateral, Proximal Cleanser: Soap and Water 1 x Per Day/30 Days Discharge Instructions: May shower and wash wound with dial antibacterial soap and water prior to dressing change. Cleanser: Wound Cleanser 1 x Per Day/30 Days Discharge Instructions: Cleanse the wound with wound cleanser prior to applying a clean dressing using gauze sponges, not tissue or cotton balls. Topical: Skintegrity Hydrogel 4 (oz) 1 x Per Day/30 Days Discharge Instructions: Apply hydrogel as directed Prim Dressing: Endoform 2x2 in 1 x Per Day/30 Days ary Discharge Instructions: Moisten with saline Secondary Dressing: Woven Gauze Sponge, Non-Sterile 4x4 in 1 x Per Day/30 Days Discharge Instructions: Apply over primary dressing as directed. Secured With: Elastic Bandage 4  inch (ACE bandage) 1 x Per Day/30 Days Discharge Instructions: Secure with ACE bandage as directed. Secured With: [employer], 4.5x3.1 (in/yd) 1 x Per Day/30 Days Discharge Instructions: Secure with Kerlix as directed. Patient Medications llergies: No Known Drug Allergies A Notifications Medication Indication Start End 03/28/2023 lidocaine DOSE topical 4 % cream - cream topical Electronic Signature(s) Signed: 03/28/2023 12:55:03 PM By: [name] MD FACS Entered By: [name] on 03/28/2023 07:35:38 Problem List Details -------------------------------------------------------------------------------- [name] ([id number]) [id number] Page 11 of 22 Patient Name: Date of Service: [name], [name] 03/28/2023 10:00 A M Medical Record Number: [id number] Patient Account Number: [id number] Date of Birth/Sex: Treating RN: 12-14-1950 (72 y.o. 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 lower 04/12/2021 No Yes extremity E11.51 Type 2 diabetes mellitus with diabetic peripheral angiopathy without gangrene 04/12/2021 No Yes I89.0 Lymphedema, not elsewhere classified 04/12/2021 No Yes L85.9 Epidermal thickening, unspecified 08/30/2022 No Yes Inactive Problems ICD-10 Code Description Active Date Inactive Date E11.621 Type 2 diabetes mellitus with foot ulcer 04/12/2021 04/12/2021 E11.42 Type 2 diabetes mellitus with diabetic polyneuropathy 04/12/2021 04/12/2021 L02.416 Cutaneous  abscess of left lower limb 06/13/2021 06/13/2021 Resolved Problems ICD-10 Code Description Active Date Resolved Date L97.828 Non-pressure chronic ulcer of other part of left lower leg with other specified severity 04/12/2021 04/12/2021 G40.102 Non-pressure chronic ulcer of other part of left lower leg with other specified severity 01/10/2023 06/08/2022 Electronic Signature(s) Signed: 03/28/2023 10:31:49 AM By: [name] MD FACS Entered By: [name] on 03/28/2023 07:31:48 [name] ([id number]) [id number] Page 12 of 22 -------------------------------------------------------------------------------- Progress Note Details Patient Name: Date of Service: [name], [name] 03/28/2023 10:00 A M Medical Record Number: [id number] Patient Account Number: [id number] Date of Birth/Sex: Treating RN: 17-Jun-1951 (72 y.o. M) Primary Care Provider: [name] Other Clinician: Referring Provider: Treating Provider/Extender: [name] in Treatment: 102 Subjective Chief Complaint  RHYATT, [name] ([id number]) [id number] Page 1 of 22 Visit Report for 03/28/2023 Chief Complaint Document Details Patient Name: Date of Service: [name], [name] 03/28/2023 10:00 A M Medical Record Number: [id number] Patient Account Number: [id number] Date of Birth/Sex: Treating RN: 01-May-1951 (72 y.o. M) Primary Care Provider: [name] Other Clinician: Referring Provider: Treating Provider/Extender: [name] in Treatment: 102 Information Obtained from: Patient Chief Complaint Left leg and foot ulcers 04/12/2021; patient is here for wounds on his left lower leg and left plantar foot over the first metatarsal head Electronic Signature(s) Signed: 03/28/2023 10:32:08 AM By: [name] MD FACS Entered By: [name] on 03/28/2023 07:32:08 -------------------------------------------------------------------------------- Debridement Details Patient Name: Date of Service: [name] Grapes. 03/28/2023 10:00 A M Medical Record Number: [id number] Patient Account Number: [id number] Date of Birth/Sex: Treating RN: January 03, 1951 (72 y.o. [name] Primary Care Provider: [name] Other Clinician: Referring Provider: Treating Provider/Extender: [name] in Treatment: 102 Debridement Performed for Assessment: Wound #22R Left,Proximal,Lateral Lower Leg Performed By: Physician [name], MD The following information was scribed by: [name] The information was scribed for: [name] Debridement Type: Debridement Level of Consciousness (Pre-procedure): Awake and Alert Pre-procedure Verification/Time Out Yes - 10:09 Taken: Start Time: 10:09 Pain Control: Lidocaine 4% Topical Solution Percent of Wound Bed Debrided: 100% T Area Debrided (cm): otal 0.6 Tissue and other material debrided: Non-Viable, Eschar, Slough, Slough Level: Non-Viable Tissue Debridement Description:  Selective/Open Wound Instrument: Curette Bleeding: Minimum Hemostasis Achieved: Pressure Response to Treatment: Procedure was tolerated well Level of Consciousness (Post- Awake and Alert procedure): Post Debridement Measurements of Total Wound Length: (cm) 1.9 Width: (cm) 0.4 Depth: (cm) 0.1 Volume: (cm) 0.06 Character of Wound/Ulcer Post Debridement: Improved [name] ([id number]) [id number] Page 2 of 22 Post Procedure Diagnosis Same as Pre-procedure Electronic Signature(s) Signed: 03/28/2023 12:55:03 PM By: [name] MD FACS Signed: 03/28/2023 3:36:57 PM By: [name] Entered By: [name] on 03/28/2023 07:13:38 -------------------------------------------------------------------------------- Debridement Details Patient Name: Date of Service: [name] Grapes. 03/28/2023 10:00 A M Medical Record Number: [id number] Patient Account Number: [id number] Date of Birth/Sex: Treating RN: 1950/10/08 (72 y.o. [name] Primary Care Provider: [name] Other Clinician: Referring Provider: Treating Provider/Extender: [name] in Treatment: 102 Debridement Performed for Assessment: Wound #18R Left,Plantar Metatarsal head first Performed By: Physician [name], MD The following information was scribed by: [name] The information was scribed for: [name] Debridement Type: Debridement Severity of Tissue Pre Debridement: Fat layer exposed Level of Consciousness (Pre-procedure): Awake and Alert Pre-procedure Verification/Time Out Yes - 10:09 Taken: Start Time: 10:09 Pain Control: Lidocaine 4% Topical Solution Percent of Wound Bed Debrided: 100% T Area Debrided (cm): otal 2.09 Tissue and other material debrided: Non-Viable, Callus, Slough, Skin: Epidermis, Slough Level: Skin/Epidermis Debridement Description: Selective/Open Wound Instrument: Curette Bleeding:  Minimum Hemostasis Achieved: Pressure Response to Treatment: Procedure was tolerated well Level of Consciousness (Post- Awake and Alert procedure): Post Debridement Measurements of Total Wound Length: (cm) 1.4 Width: (cm) 1.9 Depth: (cm) 0.2 Volume: (cm) 0.418 Character of Wound/Ulcer Post Debridement: Improved Severity of Tissue Post Debridement: Fat layer exposed Post Procedure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

## 2023-03-28 NOTE — Progress Notes (Signed)
cm) 0.1 Area: (cm) 0.597 Volume: (cm) 0.06 Lubinski, Rochell W ([id number]) % Reduction in Area: 63.8% % Reduction in Volume: 95.5% Epithelialization: Large (67-100%) Tunneling: No Undermining: No [id number] Page 9 of 10 Wound Description Classification: Full Thickness With Exposed Support Structures Wound Margin: Distinct, outline attached Exudate Amount: Small Exudate Type: Serous Exudate Color: amber Foul Odor After Cleansing: No Slough/Fibrino No Wound Bed Granulation Amount: Large (67-100%) Exposed Structure Granulation Quality: Red Fascia Exposed: No Necrotic Amount: None Present (0%) Fat Layer (Subcutaneous Tissue) Exposed: Yes Tendon  Exposed: No Muscle Exposed: No Joint Exposed: No Bone Exposed: No Periwound Skin Texture Texture Color No Abnormalities Noted: No No Abnormalities Noted: Yes Scarring: Yes Temperature / Pain Temperature: No Abnormality Moisture No Abnormalities Noted: Yes Treatment Notes Wound #22R (Lower Leg) Wound Laterality: Left, Lateral, Proximal Cleanser Soap and Water Discharge Instruction: May shower and wash wound with dial antibacterial soap and water prior to dressing change. Wound Cleanser Discharge Instruction: Cleanse the wound with wound cleanser prior to applying a clean dressing using gauze sponges, not tissue or cotton balls. Peri-Wound Care Topical Skintegrity Hydrogel 4 (oz) Discharge Instruction: Apply hydrogel as directed Primary Dressing Endoform 2x2 in Discharge Instruction: Moisten with saline Secondary Dressing Woven Gauze Sponge, Non-Sterile 4x4 in Discharge Instruction: Apply over primary dressing as directed. Secured With Elastic Bandage 4 inch (ACE bandage) Discharge Instruction: Secure with ACE bandage as directed. Kerlix Roll Sterile, 4.5x3.1 (in/yd) Discharge Instruction: Secure with Kerlix as directed. Compression Wrap Compression Stockings Add-Ons Electronic Signature(s) Signed: 03/28/2023 3:36:57 PM By: [name] Entered By: [name] on 03/28/2023 07:01:08 Vitals Details -------------------------------------------------------------------------------- [name] ([id number]) [id number] Page 10 of 10 Patient Name: Date of Service: ARBY, DAHIR 03/28/2023 10:00 A M Medical Record Number: [id number] Patient Account Number: [id number] Date of Birth/Sex: Treating RN: 1951/05/02 (72 y.o. [name] Primary Care Cara Aguino: [name] Other Clinician: Referring Jovi Alvizo: Treating Makail Watling/Extender: [name] in Treatment: 102 Vital Signs Time Taken: 09:55 Temperature  (F): 98.1 Height (in): 74 Pulse (bpm): 69 Weight (lbs): 238 Respiratory Rate (breaths/min): 16 Body Mass Index (BMI): 30.6 Blood Pressure (mmHg): 137/74 Capillary Blood Glucose (mg/dl): 010 Reference Range: 80 - 120 mg / dl Electronic Signature(s) Signed: 03/28/2023 3:36:57 PM By: [name] Entered By: [name] on 03/28/2023 06:56:17  Medical Record Number: [id number] Patient Account Number: [id number] Date of Birth/Sex: Treating RN: 01-Aug-1950 (72 y.o. M) Primary Care Luzelena Heeg: [name] Other Clinician: Referring Kaylem Gidney: Treating Harjas Biggins/Extender: [name] in Treatment: 102 Vital Signs Height(in): 74 Capillary Blood Glucose(mg/dl): 782 Weight(lbs): 956 Pulse(bpm): 69 Body Mass Index(BMI): 30.6 Blood Pressure(mmHg): 137/74 Temperature(F): 98.1 Respiratory Rate(breaths/min): 16 [18R:Photos: No Photos Left, Plantar Metatarsal head first Wound Location: Gradually Appeared Wounding Event: Diabetic Wound/Ulcer of the Lower Primary Etiology: Extremity Glaucoma, Sleep Apnea, Comorbid History: Hypertension, Peripheral Arterial Hypertension,  Peripheral Arterial Disease, Peripheral Venous Disease,Disease, Peripheral Venous Disease, Type II Diabetes, Gout, Osteoarthritis, Neuropathy 08/23/2020 Date Acquired: 102 Weeks of Treatment: Open Wound Status: Yes Wound Recurrence: No Clustered  Wound:  1.4x1.9x0.2 Measurements L x W x D (cm) 2.089 A (cm) : rea 0.418 Volume (cm) : 85.20% % Reduction in A rea: 85.20% % Reduction in Volume: Grade 2 Classification: Medium Exudate A mount: Serosanguineous Exudate Type: red, brown Exudate Color: Epibole  Wound Margin: Large (67-100%) Granulation A mount: Red Granulation Quality: Small (1-33%) Necrotic A mount: Fat Layer (Subcutaneous Tissue): Yes Fat Layer (Subcutaneous Tissue): Yes N/A Exposed Structures: Fascia: No Tendon: No Muscle: No Joint: No Bone:  No Small (1-33%) Epithelialization: Debridement - Selective/Open Wound Debridement - Selective/Open Wound N/A Debridement: Pre-procedure Verification/Time Out 10:09 Taken: Lidocaine 4% T opical Solution Pain Control: Callus, Slough Tissue Debrided:  Skin/Epidermis Level: 2.09 Debridement A (sq cm): rea Curette Instrument: Minimum Bleeding: Pressure Hemostasis A chieved: Procedure was tolerated well Debridement Treatment Response: 1.4x1.9x0.2 Post Debridement Measurements L x W x D (cm) 0.418 Post  Debridement Volume: (cm) Callus: Yes Periwound Skin Texture: Dry/Scaly: Yes Periwound Skin Moisture: No Abnormalities Noted Periwound Skin Color: No Abnormality Temperature: Compression Therapy Procedures Performed: Debridement] [22R:No Photos Left,  Proximal, Lateral Lower Leg Bump Cyst Glaucoma, Sleep Apnea, Neuropathy 06/03/2021 94 Open Yes Yes 1.9x0.4x0.1 0.597 0.06 63.80% 95.50% Full Thickness With Exposed Support N/A Structures Small Serous amber Distinct, outline attached Large (67-100%) Red  None Present (0%) Fascia: No Tendon: No Muscle: No Joint: No Bone: No Large (67-100%) 10:09 Lidocaine 4% Topical Solution Necrotic/Eschar, Slough Non-Viable Tissue 0.6 Curette Minimum Pressure Procedure was tolerated well 1.9x0.4x0.1 0.06 Scarring: Yes  Dry/Scaly: Yes No Abnormalities Noted No Abnormality] [N/A:N/A N/A N/A N/A N/A N/A N/A N/A N/A N/A N/A N/A N/A N/A N/A N/A N/A N/A N/A N/A N/A N/A N/A N/A N/A N/A  N/A N/A N/A N/A N/A N/A N/A N/A N/A N/A N/A N/A N/A] Thatch, Vinicio W ([id number]) [18R:Treatment Notes Wound #18R (Metatarsal head first) Wound Laterality: Plantar, Left Cleanser Soap and Water Discharge Instruction: May shower and wash wound with dial antibacterial soap and water prior to dressing change. Wound Cleanser Discharge  Instruction: Cleanse the wound with wound cleanser prior to applying a clean dressing using gauze sponges, not tissue or cotton balls.] [N/A:[id number] Page 4 of 10] Peri-Wound Care Ketoconazole Cream 2% Discharge Instruction: Apply Ketoconazole as directed Triamcinolone 15 (g) Discharge Instruction: Use triamcinolone 15 (g) as directed Topical Gentamicin Discharge Instruction: As directed by physician Mupirocin Ointment Discharge Instruction: Apply Mupirocin (Bactroban) as instructed Primary Dressing Maxorb Extra Ag+ Alginate Dressing, 2x2 (in/in) Discharge Instruction: Apply to wound bed as instructed Secondary Dressing Optifoam Non-Adhesive Dressing, 4x4 in Discharge Instruction: Apply over primary dressing as directed. Woven Gauze Sponges 2x2 in Discharge Instruction: Apply over primary dressing as directed. Zetuvit Absorbent Pad, 4x4 (in/in) Secured With Compression Wrap Urgo K2, (equivalent to a 4 layer) two layer compression system, regular Discharge Instruction: Apply  Urgo K2 as directed (alternative to 4 layer compression). Compression Stockings Add-Ons Wound #22R (Lower Leg) Wound Laterality: Left, Lateral, Proximal Cleanser Soap and Water Discharge Instruction: May shower and wash wound with dial antibacterial soap and water prior to dressing change. Wound Cleanser Discharge Instruction: Cleanse the wound with wound cleanser prior to applying a clean dressing using gauze sponges, not tissue or cotton balls. Peri-Wound Care Topical Skintegrity Hydrogel 4 (oz) Discharge Instruction: Apply hydrogel as directed Primary  Dressing Endoform 2x2 in Discharge Instruction: Moisten with saline Secondary Dressing Woven Gauze Sponge, Non-Sterile 4x4 in Discharge Instruction: Apply over primary dressing as directed. Secured With Elastic Bandage 4 inch (ACE bandage) Discharge Instruction: Secure with ACE bandage as directed. Kerlix Roll Sterile, 4.5x3.1 (in/yd) Discharge Instruction: Secure with Kerlix as directed. Compression DAO, MEMMOTT ([id number]) [id number] Page 5 of 10 Compression Stockings Add-Ons Electronic Signature(s) Signed: 03/28/2023 10:31:57 AM By: [name] MD FACS Entered By: [name] on 03/28/2023 07:31:57 -------------------------------------------------------------------------------- Multi-Disciplinary Care Plan Details Patient Name: Date of Service: [name]. 03/28/2023 10:00 A M Medical Record Number: [id number] Patient Account Number: [id number] Date of Birth/Sex: Treating RN: 30-Aug-1950 (72 y.o. [name] Primary Care Raiven Belizaire: [name] Other Clinician: Referring Kindred Reidinger: Treating Ruchama Kubicek/Extender: [name] in Treatment: 102 Multidisciplinary Care Plan reviewed with physician Active Inactive Venous Leg Ulcer Nursing Diagnoses: Knowledge deficit related to disease process and management Potential for venous Insuffiency (use before diagnosis confirmed) Goals: Patient will maintain optimal edema control Date Initiated: 07/27/2021 Target Resolution Date: 04/26/2023 Goal Status: Active Interventions: Assess peripheral edema status every visit. Treatment Activities: Therapeutic compression applied : 07/27/2021 Notes: Wound/Skin Impairment Nursing Diagnoses: Impaired tissue integrity Knowledge deficit related to ulceration/compromised skin integrity Goals: Patient will have a decrease in wound volume by X% from date: (specify in notes) Date Initiated: 04/12/2021 Date Inactivated:  01/04/2022 Target Resolution Date: 04/23/2021 Goal Status: Met Patient/caregiver will verbalize understanding of skin care regimen Date Initiated: 01/04/2022 Target Resolution Date: 04/26/2023 Goal Status: Active Ulcer/skin breakdown will have a volume reduction of 30% by week 4 Date Initiated: 04/12/2021 Date Inactivated: 04/27/2021 Target Resolution Date: 04/27/2021 Goal Status: Unmet Unmet Reason: infection Ulcer/skin breakdown will have a volume reduction of 50% by week 8 Date Initiated: 04/27/2021 Date Inactivated: 06/29/2021 Target Resolution Date: 06/24/2021 Goal Status: Met Interventions: Assess patient/caregiver ability to obtain necessary supplies Assess patient/caregiver ability to perform ulcer/skin care regimen upon admission and as needed Assess ulceration(s) every visit Notes: AUDIEL, SCHEIBER ([id number]) [phone].pdf Page 6 of 10 Electronic Signature(s) Signed: 03/28/2023 3:36:57 PM By: [name] Entered By: [name] on 03/28/2023 07:05:11 -------------------------------------------------------------------------------- Pain Assessment Details Patient Name: Date of Service: JAEDIN, REGINA 03/28/2023 10:00 A M Medical Record Number: [id number] Patient Account Number: [id number] Date of Birth/Sex: Treating RN: July 30, 1950 (72 y.o. [name] Primary Care Tyshaun Vinzant: [name] Other Clinician: Referring Savian Mazon: Treating Kirstein Baxley/Extender: [name] in Treatment: 102 Active Problems Location of Pain Severity and Description of Pain Patient Has Paino No Site Locations Rate the pain. Current Pain Level: 0 Pain Management and Medication Current Pain Management: Electronic Signature(s) Signed: 03/28/2023 3:36:57 PM By: [name] Entered By: [name] on 03/28/2023 06:55:42 -------------------------------------------------------------------------------- Patient/Caregiver  Education Details Patient Name: Date of Service: [name] 10/3/2024andnbsp10:00 A M Medical Record Number: [id number] Patient Account Number: [id number] Date of Birth/Gender: Treating RN: 1951/02/15 (72 y.o. [name] Primary Care Physician: [name] Other Clinician: Referring Physician: Treating Physician/Extender: Mosie Lukes,  Launa Grill in Treatment: 102 Education Assessment Education Provided To: AMAHRI, DENGEL ([id number]) [id number].pdf Page 7 of 10 Patient Education Topics Provided Wound/Skin Impairment: Methods: Explain/Verbal Responses: Reinforcements needed, State content correctly Electronic Signature(s) Signed: 03/28/2023 3:36:57 PM By: [name] Entered By: [name] on 03/28/2023 07:05:25 -------------------------------------------------------------------------------- Wound Assessment Details Patient Name: Date of Service: [name]. 03/28/2023 10:00 A M Medical Record Number: [id number] Patient Account Number: [id number] Date of Birth/Sex: Treating RN: 16-Dec-1950 (72 y.o. [name] Primary Care Kirti Carl: [name] Other Clinician: Referring Edla Para: Treating Jodene Polyak/Extender: [name] in Treatment: 102 Wound Status Wound Number: 18R Primary Diabetic Wound/Ulcer of the Lower Extremity Etiology: Wound Location: Left, Plantar Metatarsal head first Wound Open Wounding Event: Gradually Appeared Status: Date Acquired: 08/23/2020 Comorbid Glaucoma, Sleep Apnea, Hypertension, Peripheral Arterial Disease, Weeks Of Treatment: 102 History: Peripheral Venous Disease, Type II Diabetes, Gout, Osteoarthritis, Clustered Wound: No Neuropathy Wound Measurements Length: (cm) 1.4 Width: (cm) 1.9 Depth: (cm) 0.2 Area: (cm) 2.089 Volume: (cm) 0.418 % Reduction in Area: 85.2% % Reduction in Volume: 85.2% Epithelialization: Small (1-33%) Tunneling:  No Undermining: No Wound Description Classification: Grade 2 Wound Margin: Epibole Exudate Amount: Medium Exudate Type: Serosanguineous Exudate Color: red, brown Foul Odor After Cleansing: No Slough/Fibrino Yes Wound Bed Granulation Amount: Large (67-100%) Exposed Structure Granulation Quality: Red Fascia Exposed: No Necrotic Amount: Small (1-33%) Fat Layer (Subcutaneous Tissue) Exposed: Yes Necrotic Quality: Adherent Slough Tendon Exposed: No Muscle Exposed: No Joint Exposed: No Bone Exposed: No Periwound Skin Texture Texture Color No Abnormalities Noted: No No Abnormalities Noted: Yes Callus: Yes Temperature / Pain Temperature: No Abnormality Moisture No Abnormalities Noted: Yes Treatment Notes Wound #18R (Metatarsal head first) Wound Laterality: Plantar, Left ALEZANDER, DIMAANO ([id number]) [id number] Page 8 of 10 Soap and Water Discharge Instruction: May shower and wash wound with dial antibacterial soap and water prior to dressing change. Wound Cleanser Discharge Instruction: Cleanse the wound with wound cleanser prior to applying a clean dressing using gauze sponges, not tissue or cotton balls. Peri-Wound Care Ketoconazole Cream 2% Discharge Instruction: Apply Ketoconazole as directed Triamcinolone 15 (g) Discharge Instruction: Use triamcinolone 15 (g) as directed Topical Gentamicin Discharge Instruction: As directed by physician Mupirocin Ointment Discharge Instruction: Apply Mupirocin (Bactroban) as instructed Primary Dressing Maxorb Extra Ag+ Alginate Dressing, 2x2 (in/in) Discharge Instruction: Apply to wound bed as instructed Secondary Dressing Optifoam Non-Adhesive Dressing, 4x4 in Discharge Instruction: Apply over primary dressing as directed. Woven Gauze Sponges 2x2 in Discharge Instruction: Apply over primary dressing as directed. Zetuvit Absorbent Pad, 4x4 (in/in) Secured With Compression Wrap Urgo K2,  (equivalent to a 4 layer) two layer compression system, regular Discharge Instruction: Apply Urgo K2 as directed (alternative to 4 layer compression). Compression Stockings Add-Ons Electronic Signature(s) Signed: 03/28/2023 3:36:57 PM By: [name] Entered By: [name] on 03/28/2023 07:02:17 -------------------------------------------------------------------------------- Wound Assessment Details Patient Name: Date of Service: [name]. 03/28/2023 10:00 A M Medical Record Number: [id number] Patient Account Number: [id number] Date of Birth/Sex: Treating RN: 1950/07/18 (72 y.o. [name] Primary Care Jazmin Ley: [name] Other Clinician: Referring Lavaun Greenfield: Treating Jermel Artley/Extender: [name] in Treatment: 102 Wound Status Wound Number: 22R Primary Cyst Etiology: Wound Location: Left, Proximal, Lateral Lower Leg Wound Open Wounding Event: Bump Status: Date Acquired: 06/03/2021 Comorbid Glaucoma, Sleep Apnea, Hypertension, Peripheral Arterial Disease, Weeks Of Treatment: 94 History: Peripheral Venous Disease, Type II Diabetes, Gout, Osteoarthritis, Clustered Wound: Yes Neuropathy Wound Measurements Length: (cm) 1.9 Width: (cm) 0.4 Depth: (  cm) 0.1 Area: (cm) 0.597 Volume: (cm) 0.06 Lubinski, Rochell W ([id number]) % Reduction in Area: 63.8% % Reduction in Volume: 95.5% Epithelialization: Large (67-100%) Tunneling: No Undermining: No [id number] Page 9 of 10 Wound Description Classification: Full Thickness With Exposed Support Structures Wound Margin: Distinct, outline attached Exudate Amount: Small Exudate Type: Serous Exudate Color: amber Foul Odor After Cleansing: No Slough/Fibrino No Wound Bed Granulation Amount: Large (67-100%) Exposed Structure Granulation Quality: Red Fascia Exposed: No Necrotic Amount: None Present (0%) Fat Layer (Subcutaneous Tissue) Exposed: Yes Tendon  Exposed: No Muscle Exposed: No Joint Exposed: No Bone Exposed: No Periwound Skin Texture Texture Color No Abnormalities Noted: No No Abnormalities Noted: Yes Scarring: Yes Temperature / Pain Temperature: No Abnormality Moisture No Abnormalities Noted: Yes Treatment Notes Wound #22R (Lower Leg) Wound Laterality: Left, Lateral, Proximal Cleanser Soap and Water Discharge Instruction: May shower and wash wound with dial antibacterial soap and water prior to dressing change. Wound Cleanser Discharge Instruction: Cleanse the wound with wound cleanser prior to applying a clean dressing using gauze sponges, not tissue or cotton balls. Peri-Wound Care Topical Skintegrity Hydrogel 4 (oz) Discharge Instruction: Apply hydrogel as directed Primary Dressing Endoform 2x2 in Discharge Instruction: Moisten with saline Secondary Dressing Woven Gauze Sponge, Non-Sterile 4x4 in Discharge Instruction: Apply over primary dressing as directed. Secured With Elastic Bandage 4 inch (ACE bandage) Discharge Instruction: Secure with ACE bandage as directed. Kerlix Roll Sterile, 4.5x3.1 (in/yd) Discharge Instruction: Secure with Kerlix as directed. Compression Wrap Compression Stockings Add-Ons Electronic Signature(s) Signed: 03/28/2023 3:36:57 PM By: [name] Entered By: [name] on 03/28/2023 07:01:08 Vitals Details -------------------------------------------------------------------------------- [name] ([id number]) [id number].pdf Page 10 of 10 Patient Name: Date of Service: ARBY, DAHIR 03/28/2023 10:00 A M Medical Record Number: [id number] Patient Account Number: [id number] Date of Birth/Sex: Treating RN: 1951/05/02 (72 y.o. [name] Primary Care Cara Aguino: [name] Other Clinician: Referring Jovi Alvizo: Treating Makail Watling/Extender: [name] in Treatment: 102 Vital Signs Time Taken: 09:55 Temperature  (F): 98.1 Height (in): 74 Pulse (bpm): 69 Weight (lbs): 238 Respiratory Rate (breaths/min): 16 Body Mass Index (BMI): 30.6 Blood Pressure (mmHg): 137/74 Capillary Blood Glucose (mg/dl): 010 Reference Range: 80 - 120 mg / dl Electronic Signature(s) Signed: 03/28/2023 3:36:57 PM By: [name] Entered By: [name] on 03/28/2023 06:56:17

## 2023-04-04 ENCOUNTER — Ambulatory Visit (INDEPENDENT_AMBULATORY_CARE_PROVIDER_SITE_OTHER): Payer: Medicare Other | Admitting: Nurse Practitioner

## 2023-04-04 ENCOUNTER — Encounter (HOSPITAL_BASED_OUTPATIENT_CLINIC_OR_DEPARTMENT_OTHER): Payer: Medicare Other | Admitting: General Surgery

## 2023-04-04 ENCOUNTER — Encounter: Payer: Self-pay | Admitting: Nurse Practitioner

## 2023-04-04 VITALS — BP 128/70 | HR 62 | Ht 73.0 in | Wt 276.2 lb

## 2023-04-04 DIAGNOSIS — E1165 Type 2 diabetes mellitus with hyperglycemia: Secondary | ICD-10-CM | POA: Diagnosis not present

## 2023-04-04 DIAGNOSIS — E782 Mixed hyperlipidemia: Secondary | ICD-10-CM

## 2023-04-04 DIAGNOSIS — E11621 Type 2 diabetes mellitus with foot ulcer: Secondary | ICD-10-CM | POA: Diagnosis not present

## 2023-04-04 DIAGNOSIS — I10 Essential (primary) hypertension: Secondary | ICD-10-CM

## 2023-04-04 DIAGNOSIS — Z794 Long term (current) use of insulin: Secondary | ICD-10-CM

## 2023-04-04 MED ORDER — INSULIN ASPART 100 UNIT/ML IJ SOLN: INTRAMUSCULAR | 3 refills | Status: DC

## 2023-04-04 NOTE — Progress Notes (Signed)
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 Instructions: Cleanse the wound with wound cleanser prior to applying a clean dressing using gauze sponges, not tissue or cotton balls. Topical: Skintegrity Hydrogel 4 (oz) 1 x  Per Day/30 Days Discharge Instructions: Apply hydrogel as directed Prim Dressing: Endoform 2x2 in 1  causing some tissue breakdown. His foot wound is also a bit larger and the tissue does not appear particularly healthy. 02/07/2023: The lateral leg wound improved quite significantly over the last week. He has been applying additional gauze over the site before applying the Ace bandage and this seems to have minimized the rubbing. The foot wound is about the same. The culture that I took last week returned with a polymicrobial population of predominantly skin flora. He is currently taking Augmentin as recommended by the culture data. 02/14/2023: The lateral leg wound is covered in a layer of eschar. Underneath, the wound is nearly healed again. The foot wound looks better today. The depth around the edges has decreased. The tissue has a healthier appearance. He is still taking Augmentin. 02/21/2023: The lateral leg wound is smaller with a layer of eschar on the surface, but is not quite healed. The foot wound is stable but the depth around the edges is almost completely obliterated. There is some callus and senescent skin around the edges. 03/07/2023: The lateral leg wound is down to just a pinhole with a little bit of eschar on the surface. The foot wound demonstrates evidence that he has been walking excessively the past week and there is more callus with moisture underneath it, but the breakdown is limited to the callus layer and there is still intact skin underneath. 03/14/2023: The lateral leg wound is closed. The foot wound looks much improved this week. There is very minimal callus accumulation around the wound edges and the tissue surface appears healthier. It seems the adjustments made to his shoe by the [facility] were effective. 03/28/2023: The lateral leg wound has reopened. The patient reports that he was not moisturizing it as much as he probably should have been and it cracked and some skin peeled away. The foot wound shows signs of pressure-related trauma  with some bruising and increased callus. The patient admits to extensive walking this past week while taking his wife to medical appointments. He does have a knee scooter and it is unclear why he does not use it. 04/04/2023: The lateral leg wound is larger again today. The patient reports that he thinks maybe some dry skin pulled off when he was changing his dressing. [name], [name] ([id number]) [id number] Page 8 of 21 It is quite dry and he states that he is using just water to moisten his endoform, rather than the hydrogel that was recommended. The foot wound is the same size, but the tissue is in better condition, without substantial bruising or friction-related trauma. Electronic Signature(s) Signed: 04/04/2023 10:46:14 AM By: [name] MD FACS Entered By: [name] on 04/04/2023 07:46:14 -------------------------------------------------------------------------------- Physical Exam Details Patient Name: Date of Service: [name]. 04/04/2023 10:00 A M Medical Record Number: [id number] Patient Account Number: [id number] Date of Birth/Sex: Treating RN: 04/19/1951 (72 y.o. M) Primary Care Provider: [name] Other Clinician: Referring Provider: Treating Provider/Extender: [name] in Treatment: 103 Constitutional Hypertensive, asymptomatic. Slightly bradycardic. . . no acute distress. Respiratory Normal work of breathing on room air. Notes 04/04/2023: The lateral leg wound is larger again today. The patient reports that he thinks maybe some dry skin pulled off when he was changing his dressing. It is quite dry and he states that he is using just water to moisten his endoform, rather than the hydrogel that was recommended. The foot wound is the same size, but the tissue is in better condition, without substantial bruising or friction-related trauma.  x Per Day/30 Days ary Discharge Instructions: Moisten with saline Secondary Dressing: Woven Gauze Sponge, Non-Sterile 4x4 in 1 x Per Day/30 Days Discharge Instructions: Apply over primary dressing as directed. Secured With: Elastic Bandage 4 inch (ACE bandage) 1 x Per Day/30 Days Discharge Instructions: Secure with ACE bandage as directed. Secured With: [employer], 4.5x3.1 (in/yd) 1 x Per Day/30 Days Discharge Instructions: Secure with Kerlix as directed. Patient Medications llergies: No Known Drug Allergies A Notifications Medication Indication Start End 04/04/2023 lidocaine DOSE topical 5 % ointment - ointment topical [name], [name] ([id number]) [id number] Page 10 of 21 Electronic Signature(s) Signed: 04/04/2023 1:10:39 PM By: [name] MD FACS Entered By: [name] on 04/04/2023 07:51:00 -------------------------------------------------------------------------------- Problem List Details Patient Name: Date of Service: [name]. 04/04/2023 10:00 A M Medical Record Number: [id number] Patient Account Number: [id number] Date of Birth/Sex: Treating RN: Mar 16, 1951 (72 y.o. 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42 Type 2 diabetes mellitus with diabetic polyneuropathy 04/12/2021 04/12/2021 L02.416 Cutaneous abscess of left lower limb 06/13/2021 06/13/2021 Resolved Problems ICD-10 Code Description Active Date Resolved Date [name], [name] ([id number]) [id number] Page 11 of 21 [phone] Non-pressure chronic ulcer of other part of left lower leg with other specified severity 04/12/2021 04/12/2021 L97.828 Non-pressure chronic ulcer of other part of left lower leg with other specified severity 01/10/2023 06/08/2022 Electronic Signature(s) Signed: 04/04/2023 10:36:21 AM By: [name] MD FACS Entered By: [name] on 04/04/2023 07:36:21 -------------------------------------------------------------------------------- Progress Note Details Patient Name: Date of Service: [name]. 04/04/2023 10:00 A M Medical Record Number: [id number] Patient Account Number: [id number] Date of Birth/Sex: Treating RN: [birth year] (72 y.o. M) Primary Care Provider: [name] Other Clinician: Referring Provider: Treating Provider/Extender: [name] in Treatment: [phone] Subjective Chief Complaint Information obtained from Patient Left leg and foot ulcers 04/12/2021; patient is here for wounds on his left lower leg and left plantar foot over the first metatarsal head History of Present Illness (HPI) 10/11/17; Mr. Clos is a 72 year old man who tells me that in 2015 he slipped down the latter traumatizing his left leg. He developed a wound in the same spot the area that we are currently looking at. He states this closed over for the most part although he always felt it was somewhat unstable. In 2016 he hit  the same area with the door of his car had this reopened. He tells me that this is never really closed although sometimes an inflow it remains open on a constant basis. He has not been using any specific dressing to this except for topical antibiotics the nature of which were not really sure. His primary doctor did send him to see Dr. Jacinto Halim of interventional cardiology. He underwent an angiogram on 08/06/17 and he underwent a PTA and directional atherectomy of the  Electronic Signature(s) Signed: 04/04/2023 10:48:57 AM By:  [name] MD FACS Entered By: [name] on 04/04/2023 07:48:57 -------------------------------------------------------------------------------- Physician Orders Details Patient Name: Date of Service: [name]. 04/04/2023 10:00 A M Medical Record Number: [id number] Patient Account Number: [id number] Date of Birth/Sex: Treating RN: 1951/02/28 (72 y.o. 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 - Dr Lady Gary - Room 2 Return A [name], [name] ([id number]) [id number] Page 9 of 21 Anesthetic (In clinic) Topical Lidocaine 5% applied to wound bed Bathing/ Shower/ Hygiene May shower and wash wound with soap and water. Edema Control - Lymphedema / SCD / Other Elevate legs to the level of the heart or above for 30 minutes daily and/or when sitting for 3-4 times a day throughout the day. Avoid standing for long periods of time. Patient to wear own compression stockings every day. - both legs daily Moisturize legs daily. Compression stocking or Garment 20-30 mm/Hg pressure to: Off-Loading Wound #18R Left,Plantar Metatarsal head first Open toe surgical shoe to: -  Front off loader Left ft Other: - minimal weight bearing left foot Additional Orders / Instructions Follow Nutritious Diet - vitamin C 500 mg 3 times a day and zinc 30-50 mg a day Wound Treatment Wound #18R - Metatarsal head first Wound Laterality: Plantar, Left Cleanser: Soap and Water 1 x Per Week/30 Days Discharge Instructions: May shower and wash wound with dial antibacterial soap and water prior to dressing change. Cleanser: Wound Cleanser 1 x Per Week/30 Days Discharge Instructions: Cleanse the wound with wound cleanser prior to applying a clean dressing using gauze sponges, not tissue or cotton balls. Prim Dressing: Maxorb Extra Ag+ Alginate Dressing, 2x2 (in/in) 1 x Per Week/30 Days ary Discharge Instructions: Apply to wound bed as instructed Secondary Dressing: Optifoam Non-Adhesive Dressing, 4x4 in (Generic) 1 x Per Week/30 Days Discharge Instructions: Apply over primary dressing as directed. Secondary Dressing: Woven Gauze Sponges 2x2 in (Generic) 1 x Per Week/30 Days Discharge Instructions: Apply over primary dressing as directed. Secured With: Elastic Bandage 4 inch (ACE bandage) 1 x Per Week/30 Days Discharge Instructions: Secure with ACE bandage as directed. Secured With: [employer], 4.5x3.1 (in/yd) 1 x Per Week/30 Days Discharge Instructions: Secure with Kerlix as directed. Secured With: 74M Medipore H Soft Cloth Surgical T ape, 4 x 10 (in/yd) 1 x Per Week/30 Days Discharge Instructions: Secure with tape as directed. Wound #22R - Lower Leg Wound Laterality: Left, Lateral, Proximal Cleanser: Soap and Water 1 x Per Day/30 Days Discharge Instructions: May shower and wash wound with dial antibacterial soap and water prior to dressing change. Cleanser: Wound Cleanser 1 x Per Day/30 Days Discharge Instructions: Cleanse the wound with wound cleanser prior to applying a clean dressing using gauze sponges, not tissue or cotton balls. Topical: Skintegrity Hydrogel 4 (oz) 1 x  Per Day/30 Days Discharge Instructions: Apply hydrogel as directed Prim Dressing: Endoform 2x2 in 1  x Per Day/30 Days ary Discharge Instructions: Moisten with saline Secondary Dressing: Woven Gauze Sponge, Non-Sterile 4x4 in 1 x Per Day/30 Days Discharge Instructions: Apply over primary dressing as directed. Secured With: Elastic Bandage 4 inch (ACE bandage) 1 x Per Day/30 Days Discharge Instructions: Secure with ACE bandage as directed. Secured With: [employer], 4.5x3.1 (in/yd) 1 x Per Day/30 Days Discharge Instructions: Secure with Kerlix as directed. Patient Medications llergies: No Known Drug Allergies A Notifications Medication Indication Start End 04/04/2023 lidocaine DOSE topical 5 % ointment - ointment topical [name], [name] ([id number]) [id number] Page 10 of 21 Electronic Signature(s) Signed: 04/04/2023 1:10:39 PM By: [name] MD FACS Entered By: [name] on 04/04/2023 07:51:00 -------------------------------------------------------------------------------- Problem List Details Patient Name: Date of Service: [name]. 04/04/2023 10:00 A M Medical Record Number: [id number] Patient Account Number: [id number] Date of Birth/Sex: Treating RN: Mar 16, 1951 (72 y.o. 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42 Type 2 diabetes mellitus with diabetic polyneuropathy 04/12/2021 04/12/2021 L02.416 Cutaneous abscess of left lower limb 06/13/2021 06/13/2021 Resolved Problems ICD-10 Code Description Active Date Resolved Date [name], [name] ([id number]) [id number] Page 11 of 21 [phone] Non-pressure chronic ulcer of other part of left lower leg with other specified severity 04/12/2021 04/12/2021 L97.828 Non-pressure chronic ulcer of other part of left lower leg with other specified severity 01/10/2023 06/08/2022 Electronic Signature(s) Signed: 04/04/2023 10:36:21 AM By: [name] MD FACS Entered By: [name] on 04/04/2023 07:36:21 -------------------------------------------------------------------------------- Progress Note Details Patient Name: Date of Service: [name]. 04/04/2023 10:00 A M Medical Record Number: [id number] Patient Account Number: [id number] Date of Birth/Sex: Treating RN: [birth year] (72 y.o. M) Primary Care Provider: [name] Other Clinician: Referring Provider: Treating Provider/Extender: [name] in Treatment: [phone] Subjective Chief Complaint Information obtained from Patient Left leg and foot ulcers 04/12/2021; patient is here for wounds on his left lower leg and left plantar foot over the first metatarsal head History of Present Illness (HPI) 10/11/17; Mr. Clos is a 72 year old man who tells me that in 2015 he slipped down the latter traumatizing his left leg. He developed a wound in the same spot the area that we are currently looking at. He states this closed over for the most part although he always felt it was somewhat unstable. In 2016 he hit  the same area with the door of his car had this reopened. He tells me that this is never really closed although sometimes an inflow it remains open on a constant basis. He has not been using any specific dressing to this except for topical antibiotics the nature of which were not really sure. His primary doctor did send him to see Dr. Jacinto Halim of interventional cardiology. He underwent an angiogram on 08/06/17 and he underwent a PTA and directional atherectomy of the  Electronic Signature(s) Signed: 04/04/2023 10:48:57 AM By:  [name] MD FACS Entered By: [name] on 04/04/2023 07:48:57 -------------------------------------------------------------------------------- Physician Orders Details Patient Name: Date of Service: [name]. 04/04/2023 10:00 A M Medical Record Number: [id number] Patient Account Number: [id number] Date of Birth/Sex: Treating RN: 1951/02/28 (72 y.o. 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 - Dr Lady Gary - Room 2 Return A [name], [name] ([id number]) [id number] Page 9 of 21 Anesthetic (In clinic) Topical Lidocaine 5% applied to wound bed Bathing/ Shower/ Hygiene May shower and wash wound with soap and water. Edema Control - Lymphedema / SCD / Other Elevate legs to the level of the heart or above for 30 minutes daily and/or when sitting for 3-4 times a day throughout the day. Avoid standing for long periods of time. Patient to wear own compression stockings every day. - both legs daily Moisturize legs daily. Compression stocking or Garment 20-30 mm/Hg pressure to: Off-Loading Wound #18R Left,Plantar Metatarsal head first Open toe surgical shoe to: -  Front off loader Left ft Other: - minimal weight bearing left foot Additional Orders / Instructions Follow Nutritious Diet - vitamin C 500 mg 3 times a day and zinc 30-50 mg a day Wound Treatment Wound #18R - Metatarsal head first Wound Laterality: Plantar, Left Cleanser: Soap and Water 1 x Per Week/30 Days Discharge Instructions: May shower and wash wound with dial antibacterial soap and water prior to dressing change. Cleanser: Wound Cleanser 1 x Per Week/30 Days Discharge Instructions: Cleanse the wound with wound cleanser prior to applying a clean dressing using gauze sponges, not tissue or cotton balls. Prim Dressing: Maxorb Extra Ag+ Alginate Dressing, 2x2 (in/in) 1 x Per Week/30 Days ary Discharge Instructions: Apply to wound bed as instructed Secondary Dressing: Optifoam Non-Adhesive Dressing, 4x4 in (Generic) 1 x Per Week/30 Days Discharge Instructions: Apply over primary dressing as directed. Secondary Dressing: Woven Gauze Sponges 2x2 in (Generic) 1 x Per Week/30 Days Discharge Instructions: Apply over primary dressing as directed. Secured With: Elastic Bandage 4 inch (ACE bandage) 1 x Per Week/30 Days Discharge Instructions: Secure with ACE bandage as directed. Secured With: [employer], 4.5x3.1 (in/yd) 1 x Per Week/30 Days Discharge Instructions: Secure with Kerlix as directed. Secured With: 74M Medipore H Soft Cloth Surgical T ape, 4 x 10 (in/yd) 1 x Per Week/30 Days Discharge Instructions: Secure with tape as directed. Wound #22R - Lower Leg Wound Laterality: Left, Lateral, Proximal Cleanser: Soap and Water 1 x Per Day/30 Days Discharge Instructions: May shower and wash wound with dial antibacterial soap and water prior to dressing change. Cleanser: Wound Cleanser 1 x Per Day/30 Days Discharge Instructions: Cleanse the wound with wound cleanser prior to applying a clean dressing using gauze sponges, not tissue or cotton balls. Topical: Skintegrity Hydrogel 4 (oz) 1 x  Per Day/30 Days Discharge Instructions: Apply hydrogel as directed Prim Dressing: Endoform 2x2 in 1  [name], [name] ([id number]) [id number] Page 1 of 21 Visit Report for 04/04/2023 Chief Complaint Document Details Patient Name: Date of Service: LONIE, [place] 04/04/2023 10:00 A M Medical Record Number: [id number] Patient Account Number: [id number] Date of Birth/Sex: Treating RN: 1951-03-12 (72 y.o. M) Primary Care Provider: [name] Other Clinician: Referring Provider: Treating Provider/Extender: [name] in Treatment: [phone] Information Obtained from: Patient Chief Complaint Left leg and foot ulcers 04/12/2021; patient is here for wounds on his left lower leg and left plantar foot over the first metatarsal head Electronic Signature(s) Signed: 04/04/2023 10:38:59 AM By: [name] MD FACS Entered By: [name] on 04/04/2023 07:38:59 -------------------------------------------------------------------------------- Debridement Details Patient Name: Date of Service: [name]. 04/04/2023 10:00 A M Medical Record Number: [id number] Patient Account Number: [id number] Date of Birth/Sex: Treating RN: [birth year] (72 y.o. [name] Primary Care Provider: [name] Other Clinician: Referring Provider: Treating Provider/Extender: [name] in Treatment: [phone] Debridement Performed for Assessment: Wound #18R Left,Plantar Metatarsal head first Performed By: Physician [name], MD The following information was scribed by: [name] The information was scribed for: [name] Debridement Type: Debridement Severity of Tissue Pre Debridement: Fat layer exposed Level of Consciousness (Pre-procedure): Awake and Alert Pre-procedure Verification/Time Out Yes - 10:20 Taken: Start Time: 10:20 Pain Control: Lidocaine 5% topical ointment Percent of Wound Bed Debrided: 100% T Area Debrided (cm): otal 2.31 Tissue and other material debrided: Non-Viable, Callus,  Slough, Skin: Epidermis, Slough Level: Skin/Epidermis Debridement Description: Selective/Open Wound Instrument: Curette Bleeding: Minimum Hemostasis Achieved: Pressure Response to Treatment: Procedure was tolerated well Level of Consciousness (Post- Awake and Alert procedure): Post Debridement Measurements of Total Wound Length: (cm) 1.4 Width: (cm) 2.1 Depth: (cm) 0.2 Volume: (cm) 0.462 Character of Wound/Ulcer Post Debridement: Improved [name] ([id number]) [id number] Page 2 of 21 Severity of Tissue Post Debridement: Fat layer exposed Post Procedure Diagnosis Same as Pre-procedure Electronic Signature(s) Signed: 04/04/2023 1:10:39 PM By: [name] MD FACS Signed: 04/04/2023 3:36:06 PM By: Gelene Mink By: [name] on 04/04/2023 07:23:23 -------------------------------------------------------------------------------- HPI Details Patient Name: Date of Service: [name]. 04/04/2023 10:00 A M Medical Record Number: [id number] Patient Account Number: [id number] Date of Birth/Sex: Treating RN: January 14, 1951 (72 y.o. M) Primary Care Provider: [name] Other Clinician: Referring Provider: Treating Provider/Extender: [name] in Treatment: 103 History of Present Illness HPI Description: 10/11/17; Mr. Nodal is a 72 year old man who tells me that in 2015 he slipped down the latter traumatizing his left leg. He developed a wound in the same spot the area that we are currently looking at. He states this closed over for the most part although he always felt it was somewhat unstable. In 2016 he hit the same area with the door of his car had this reopened. He tells me that this is never really closed although sometimes an inflow it remains open on a constant basis. He has not been using any specific dressing to this except for topical antibiotics the nature of which were not really sure. His  primary doctor did send him to see Dr. Jacinto Halim of interventional cardiology. He underwent an angiogram on 08/06/17 and he underwent a PTA and directional atherectomy of the lesser distal SFA and popliteal arteries which resulted in brisk improvement in blood flow. It was noted that he had 2 vessel runoff through the anterior tibial and peroneal. He is also been to see vascular and interventional radiologist. He was  causing some tissue breakdown. His foot wound is also a bit larger and the tissue does not appear particularly healthy. 02/07/2023: The lateral leg wound improved quite significantly over the last week. He has been applying additional gauze over the site before applying the Ace bandage and this seems to have minimized the rubbing. The foot wound is about the same. The culture that I took last week returned with a polymicrobial population of predominantly skin flora. He is currently taking Augmentin as recommended by the culture data. 02/14/2023: The lateral leg wound is covered in a layer of eschar. Underneath, the wound is nearly healed again. The foot wound looks better today. The depth around the edges has decreased. The tissue has a healthier appearance. He is still taking Augmentin. 02/21/2023: The lateral leg wound is smaller with a layer of eschar on the surface, but is not quite healed. The foot wound is stable but the depth around the edges is almost completely obliterated. There is some callus and senescent skin around the edges. 03/07/2023: The lateral leg wound is down to just a pinhole with a little bit of eschar on the surface. The foot wound demonstrates evidence that he has been walking excessively the past week and there is more callus with moisture underneath it, but the breakdown is limited to the callus layer and there is still intact skin underneath. 03/14/2023: The lateral leg wound is closed. The foot wound looks much improved this week. There is very minimal callus accumulation around the wound edges and the tissue surface appears healthier. It seems the adjustments made to his shoe by the [facility] were effective. 03/28/2023: The lateral leg wound has reopened. The patient reports that he was not moisturizing it as much as he probably should have been and it cracked and some skin peeled away. The foot wound shows signs of pressure-related trauma  with some bruising and increased callus. The patient admits to extensive walking this past week while taking his wife to medical appointments. He does have a knee scooter and it is unclear why he does not use it. 04/04/2023: The lateral leg wound is larger again today. The patient reports that he thinks maybe some dry skin pulled off when he was changing his dressing. [name], [name] ([id number]) [id number] Page 8 of 21 It is quite dry and he states that he is using just water to moisten his endoform, rather than the hydrogel that was recommended. The foot wound is the same size, but the tissue is in better condition, without substantial bruising or friction-related trauma. Electronic Signature(s) Signed: 04/04/2023 10:46:14 AM By: [name] MD FACS Entered By: [name] on 04/04/2023 07:46:14 -------------------------------------------------------------------------------- Physical Exam Details Patient Name: Date of Service: [name]. 04/04/2023 10:00 A M Medical Record Number: [id number] Patient Account Number: [id number] Date of Birth/Sex: Treating RN: 04/19/1951 (72 y.o. M) Primary Care Provider: [name] Other Clinician: Referring Provider: Treating Provider/Extender: [name] in Treatment: 103 Constitutional Hypertensive, asymptomatic. Slightly bradycardic. . . no acute distress. Respiratory Normal work of breathing on room air. Notes 04/04/2023: The lateral leg wound is larger again today. The patient reports that he thinks maybe some dry skin pulled off when he was changing his dressing. It is quite dry and he states that he is using just water to moisten his endoform, rather than the hydrogel that was recommended. The foot wound is the same size, but the tissue is in better condition, without substantial bruising or friction-related trauma.  Cleanse the wound with wound cleanser prior to applying a clean dressing using gauze sponges, not tissue or cotton balls. Topical: Skintegrity Hydrogel 4 (oz) 1 x Per Day/30 Days Discharge Instructions: Apply hydrogel as directed Prim Dressing: Endoform 2x2 in 1 x Per Day/30 Days ary Discharge Instructions: Moisten with saline Secondary Dressing: Woven Gauze Sponge, Non-Sterile 4x4 in 1 x Per Day/30 Days Discharge Instructions: Apply over primary dressing as directed. Secured With: Elastic Bandage 4 inch (ACE bandage) 1 x Per Day/30 Days Discharge Instructions: Secure with ACE bandage as directed. Secured With: [employer], 4.5x3.1 (in/yd) 1 x Per Day/30 Days Discharge Instructions: Secure with Kerlix as directed. 04/04/2023: The lateral leg wound is larger again today. The patient reports that he thinks maybe some dry skin pulled off when he was changing his dressing. It is quite dry and he states that he is using just water to moisten his endoform, rather than the hydrogel that was recommended. The foot wound is the same size, but the tissue is in better condition, without substantial bruising or friction-related trauma. The lateral leg ulcer did not require debridement. We will continue endoform here, but I have asked him to go back to using hydrogel to keep it a little bit more moist. He also needs to cover it before wrapping with gauze and an Ace bandage. I debrided callus, skin, and slough from the foot ulcer. We are using silver alginate here. I do not think he needs topical antibiotic ointment right now. We are going to let him try changing the dressing daily at home.  Hopefully daily cleansing with antibacterial soap will help. Follow up in one week. Electronic Signature(s) Signed: 04/04/2023 11:23:45 AM By: [name] MD FACS Previous Signature: 04/04/2023 11:11:23 AM Version By: [name] MD FACS Previous Signature: 04/04/2023 10:51:14 AM Version By: [name] MD FACS Entered By: [name] on 04/04/2023 08:23:45 -------------------------------------------------------------------------------- HxROS Details Patient Name: Date of Service: [name]. 04/04/2023 10:00 A M Medical Record Number: [id number] Patient Account Number: [id number] Date of Birth/Sex: Treating RN: 22-Oct-1950 (72 y.o. M) Primary Care Provider: [name] Other Clinician: Referring Provider: Treating Provider/Extender: [name] in Treatment: 103 Information Obtained From Patient Eyes Medical History: Positive for: Glaucoma - both eyes Negative for: Cataracts; Optic Neuritis Ear/Nose/Mouth/Throat Medical History: Negative for: Chronic sinus problems/congestion; Middle ear problems Hematologic/Lymphatic Medical History: Negative for: Anemia; Hemophilia; Human Immunodeficiency Virus; Lymphedema; Sickle Cell Disease Respiratory Medical History: Positive for: Sleep Apnea - CPAP Negative for: Aspiration; Asthma; Chronic Obstructive Pulmonary Disease (COPD); Pneumothorax; Tuberculosis Cardiovascular [name], [name] ([id number]) [id number] Page 20 of 21 Medical History: Positive for: Hypertension; Peripheral Arterial Disease; Peripheral Venous Disease Negative for: Angina; Arrhythmia; Congestive Heart Failure; Coronary Artery Disease; Deep Vein Thrombosis; Hypotension; Myocardial Infarction; Phlebitis; Vasculitis Gastrointestinal Medical History: Negative for: Cirrhosis ; Colitis; Crohns; Hepatitis A; Hepatitis B; Hepatitis C Endocrine Medical History: Positive for: Type II  Diabetes Negative for: Type I Diabetes Time with diabetes: 13 years Treated with: Insulin, Oral agents Blood sugar tested every day: Yes Tested : 2x/day Genitourinary Medical History: Negative for: End Stage Renal Disease Past Medical History Notes: Stage 3 CKD Immunological Medical History: Negative for: Lupus Erythematosus; Raynauds; Scleroderma Integumentary (Skin) Medical History: Negative for: History of Burn Musculoskeletal Medical History: Positive for: Gout - left great toe; Osteoarthritis Negative for: Rheumatoid Arthritis; Osteomyelitis Neurologic Medical History: Positive for: Neuropathy Negative for: Dementia; Quadriplegia; Paraplegia; Seizure Disorder Oncologic Medical History: Negative for: Received Chemotherapy; Received Radiation Psychiatric Medical  Electronic Signature(s) Signed: 04/04/2023 10:48:57 AM By:  [name] MD FACS Entered By: [name] on 04/04/2023 07:48:57 -------------------------------------------------------------------------------- Physician Orders Details Patient Name: Date of Service: [name]. 04/04/2023 10:00 A M Medical Record Number: [id number] Patient Account Number: [id number] Date of Birth/Sex: Treating RN: 1951/02/28 (72 y.o. 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 - Dr Lady Gary - Room 2 Return A [name], [name] ([id number]) [id number] Page 9 of 21 Anesthetic (In clinic) Topical Lidocaine 5% applied to wound bed Bathing/ Shower/ Hygiene May shower and wash wound with soap and water. Edema Control - Lymphedema / SCD / Other Elevate legs to the level of the heart or above for 30 minutes daily and/or when sitting for 3-4 times a day throughout the day. Avoid standing for long periods of time. Patient to wear own compression stockings every day. - both legs daily Moisturize legs daily. Compression stocking or Garment 20-30 mm/Hg pressure to: Off-Loading Wound #18R Left,Plantar Metatarsal head first Open toe surgical shoe to: -  Front off loader Left ft Other: - minimal weight bearing left foot Additional Orders / Instructions Follow Nutritious Diet - vitamin C 500 mg 3 times a day and zinc 30-50 mg a day Wound Treatment Wound #18R - Metatarsal head first Wound Laterality: Plantar, Left Cleanser: Soap and Water 1 x Per Week/30 Days Discharge Instructions: May shower and wash wound with dial antibacterial soap and water prior to dressing change. Cleanser: Wound Cleanser 1 x Per Week/30 Days Discharge Instructions: Cleanse the wound with wound cleanser prior to applying a clean dressing using gauze sponges, not tissue or cotton balls. Prim Dressing: Maxorb Extra Ag+ Alginate Dressing, 2x2 (in/in) 1 x Per Week/30 Days ary Discharge Instructions: Apply to wound bed as instructed Secondary Dressing: Optifoam Non-Adhesive Dressing, 4x4 in (Generic) 1 x Per Week/30 Days Discharge Instructions: Apply over primary dressing as directed. Secondary Dressing: Woven Gauze Sponges 2x2 in (Generic) 1 x Per Week/30 Days Discharge Instructions: Apply over primary dressing as directed. Secured With: Elastic Bandage 4 inch (ACE bandage) 1 x Per Week/30 Days Discharge Instructions: Secure with ACE bandage as directed. Secured With: [employer], 4.5x3.1 (in/yd) 1 x Per Week/30 Days Discharge Instructions: Secure with Kerlix as directed. Secured With: 74M Medipore H Soft Cloth Surgical T ape, 4 x 10 (in/yd) 1 x Per Week/30 Days Discharge Instructions: Secure with tape as directed. Wound #22R - Lower Leg Wound Laterality: Left, Lateral, Proximal Cleanser: Soap and Water 1 x Per Day/30 Days Discharge Instructions: May shower and wash wound with dial antibacterial soap and water prior to dressing change. Cleanser: Wound Cleanser 1 x Per Day/30 Days Discharge Instructions: Cleanse the wound with wound cleanser prior to applying a clean dressing using gauze sponges, not tissue or cotton balls. Topical: Skintegrity Hydrogel 4 (oz) 1 x  Per Day/30 Days Discharge Instructions: Apply hydrogel as directed Prim Dressing: Endoform 2x2 in 1  x Per Day/30 Days ary Discharge Instructions: Moisten with saline Secondary Dressing: Woven Gauze Sponge, Non-Sterile 4x4 in 1 x Per Day/30 Days Discharge Instructions: Apply over primary dressing as directed. Secured With: Elastic Bandage 4 inch (ACE bandage) 1 x Per Day/30 Days Discharge Instructions: Secure with ACE bandage as directed. Secured With: [employer], 4.5x3.1 (in/yd) 1 x Per Day/30 Days Discharge Instructions: Secure with Kerlix as directed. Patient Medications llergies: No Known Drug Allergies A Notifications Medication Indication Start End 04/04/2023 lidocaine DOSE topical 5 % ointment - ointment topical [name], [name] ([id number]) [id number] Page 10 of 21 Electronic Signature(s) Signed: 04/04/2023 1:10:39 PM By: [name] MD FACS Entered By: [name] on 04/04/2023 07:51:00 -------------------------------------------------------------------------------- Problem List Details Patient Name: Date of Service: [name]. 04/04/2023 10:00 A M Medical Record Number: [id number] Patient Account Number: [id number] Date of Birth/Sex: Treating RN: Mar 16, 1951 (72 y.o. 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42 Type 2 diabetes mellitus with diabetic polyneuropathy 04/12/2021 04/12/2021 L02.416 Cutaneous abscess of left lower limb 06/13/2021 06/13/2021 Resolved Problems ICD-10 Code Description Active Date Resolved Date [name], [name] ([id number]) [id number] Page 11 of 21 [phone] Non-pressure chronic ulcer of other part of left lower leg with other specified severity 04/12/2021 04/12/2021 L97.828 Non-pressure chronic ulcer of other part of left lower leg with other specified severity 01/10/2023 06/08/2022 Electronic Signature(s) Signed: 04/04/2023 10:36:21 AM By: [name] MD FACS Entered By: [name] on 04/04/2023 07:36:21 -------------------------------------------------------------------------------- Progress Note Details Patient Name: Date of Service: [name]. 04/04/2023 10:00 A M Medical Record Number: [id number] Patient Account Number: [id number] Date of Birth/Sex: Treating RN: [birth year] (72 y.o. M) Primary Care Provider: [name] Other Clinician: Referring Provider: Treating Provider/Extender: [name] in Treatment: [phone] Subjective Chief Complaint Information obtained from Patient Left leg and foot ulcers 04/12/2021; patient is here for wounds on his left lower leg and left plantar foot over the first metatarsal head History of Present Illness (HPI) 10/11/17; Mr. Clos is a 72 year old man who tells me that in 2015 he slipped down the latter traumatizing his left leg. He developed a wound in the same spot the area that we are currently looking at. He states this closed over for the most part although he always felt it was somewhat unstable. In 2016 he hit  the same area with the door of his car had this reopened. He tells me that this is never really closed although sometimes an inflow it remains open on a constant basis. He has not been using any specific dressing to this except for topical antibiotics the nature of which were not really sure. His primary doctor did send him to see Dr. Jacinto Halim of interventional cardiology. He underwent an angiogram on 08/06/17 and he underwent a PTA and directional atherectomy of the  Cleanse the wound with wound cleanser prior to applying a clean dressing using gauze sponges, not tissue or cotton balls. Topical: Skintegrity Hydrogel 4 (oz) 1 x Per Day/30 Days Discharge Instructions: Apply hydrogel as directed Prim Dressing: Endoform 2x2 in 1 x Per Day/30 Days ary Discharge Instructions: Moisten with saline Secondary Dressing: Woven Gauze Sponge, Non-Sterile 4x4 in 1 x Per Day/30 Days Discharge Instructions: Apply over primary dressing as directed. Secured With: Elastic Bandage 4 inch (ACE bandage) 1 x Per Day/30 Days Discharge Instructions: Secure with ACE bandage as directed. Secured With: [employer], 4.5x3.1 (in/yd) 1 x Per Day/30 Days Discharge Instructions: Secure with Kerlix as directed. 04/04/2023: The lateral leg wound is larger again today. The patient reports that he thinks maybe some dry skin pulled off when he was changing his dressing. It is quite dry and he states that he is using just water to moisten his endoform, rather than the hydrogel that was recommended. The foot wound is the same size, but the tissue is in better condition, without substantial bruising or friction-related trauma. The lateral leg ulcer did not require debridement. We will continue endoform here, but I have asked him to go back to using hydrogel to keep it a little bit more moist. He also needs to cover it before wrapping with gauze and an Ace bandage. I debrided callus, skin, and slough from the foot ulcer. We are using silver alginate here. I do not think he needs topical antibiotic ointment right now. We are going to let him try changing the dressing daily at home.  Hopefully daily cleansing with antibacterial soap will help. Follow up in one week. Electronic Signature(s) Signed: 04/04/2023 11:23:45 AM By: [name] MD FACS Previous Signature: 04/04/2023 11:11:23 AM Version By: [name] MD FACS Previous Signature: 04/04/2023 10:51:14 AM Version By: [name] MD FACS Entered By: [name] on 04/04/2023 08:23:45 -------------------------------------------------------------------------------- HxROS Details Patient Name: Date of Service: [name]. 04/04/2023 10:00 A M Medical Record Number: [id number] Patient Account Number: [id number] Date of Birth/Sex: Treating RN: 22-Oct-1950 (72 y.o. M) Primary Care Provider: [name] Other Clinician: Referring Provider: Treating Provider/Extender: [name] in Treatment: 103 Information Obtained From Patient Eyes Medical History: Positive for: Glaucoma - both eyes Negative for: Cataracts; Optic Neuritis Ear/Nose/Mouth/Throat Medical History: Negative for: Chronic sinus problems/congestion; Middle ear problems Hematologic/Lymphatic Medical History: Negative for: Anemia; Hemophilia; Human Immunodeficiency Virus; Lymphedema; Sickle Cell Disease Respiratory Medical History: Positive for: Sleep Apnea - CPAP Negative for: Aspiration; Asthma; Chronic Obstructive Pulmonary Disease (COPD); Pneumothorax; Tuberculosis Cardiovascular [name], [name] ([id number]) [id number] Page 20 of 21 Medical History: Positive for: Hypertension; Peripheral Arterial Disease; Peripheral Venous Disease Negative for: Angina; Arrhythmia; Congestive Heart Failure; Coronary Artery Disease; Deep Vein Thrombosis; Hypotension; Myocardial Infarction; Phlebitis; Vasculitis Gastrointestinal Medical History: Negative for: Cirrhosis ; Colitis; Crohns; Hepatitis A; Hepatitis B; Hepatitis C Endocrine Medical History: Positive for: Type II  Diabetes Negative for: Type I Diabetes Time with diabetes: 13 years Treated with: Insulin, Oral agents Blood sugar tested every day: Yes Tested : 2x/day Genitourinary Medical History: Negative for: End Stage Renal Disease Past Medical History Notes: Stage 3 CKD Immunological Medical History: Negative for: Lupus Erythematosus; Raynauds; Scleroderma Integumentary (Skin) Medical History: Negative for: History of Burn Musculoskeletal Medical History: Positive for: Gout - left great toe; Osteoarthritis Negative for: Rheumatoid Arthritis; Osteomyelitis Neurologic Medical History: Positive for: Neuropathy Negative for: Dementia; Quadriplegia; Paraplegia; Seizure Disorder Oncologic Medical History: Negative for: Received Chemotherapy; Received Radiation Psychiatric Medical  causing some tissue breakdown. His foot wound is also a bit larger and the tissue does not appear particularly healthy. 02/07/2023: The lateral leg wound improved quite significantly over the last week. He has been applying additional gauze over the site before applying the Ace bandage and this seems to have minimized the rubbing. The foot wound is about the same. The culture that I took last week returned with a polymicrobial population of predominantly skin flora. He is currently taking Augmentin as recommended by the culture data. 02/14/2023: The lateral leg wound is covered in a layer of eschar. Underneath, the wound is nearly healed again. The foot wound looks better today. The depth around the edges has decreased. The tissue has a healthier appearance. He is still taking Augmentin. 02/21/2023: The lateral leg wound is smaller with a layer of eschar on the surface, but is not quite healed. The foot wound is stable but the depth around the edges is almost completely obliterated. There is some callus and senescent skin around the edges. 03/07/2023: The lateral leg wound is down to just a pinhole with a little bit of eschar on the surface. The foot wound demonstrates evidence that he has been walking excessively the past week and there is more callus with moisture underneath it, but the breakdown is limited to the callus layer and there is still intact skin underneath. 03/14/2023: The lateral leg wound is closed. The foot wound looks much improved this week. There is very minimal callus accumulation around the wound edges and the tissue surface appears healthier. It seems the adjustments made to his shoe by the [facility] were effective. 03/28/2023: The lateral leg wound has reopened. The patient reports that he was not moisturizing it as much as he probably should have been and it cracked and some skin peeled away. The foot wound shows signs of pressure-related trauma  with some bruising and increased callus. The patient admits to extensive walking this past week while taking his wife to medical appointments. He does have a knee scooter and it is unclear why he does not use it. 04/04/2023: The lateral leg wound is larger again today. The patient reports that he thinks maybe some dry skin pulled off when he was changing his dressing. [name], [name] ([id number]) [id number] Page 8 of 21 It is quite dry and he states that he is using just water to moisten his endoform, rather than the hydrogel that was recommended. The foot wound is the same size, but the tissue is in better condition, without substantial bruising or friction-related trauma. Electronic Signature(s) Signed: 04/04/2023 10:46:14 AM By: [name] MD FACS Entered By: [name] on 04/04/2023 07:46:14 -------------------------------------------------------------------------------- Physical Exam Details Patient Name: Date of Service: [name]. 04/04/2023 10:00 A M Medical Record Number: [id number] Patient Account Number: [id number] Date of Birth/Sex: Treating RN: 04/19/1951 (72 y.o. M) Primary Care Provider: [name] Other Clinician: Referring Provider: Treating Provider/Extender: [name] in Treatment: 103 Constitutional Hypertensive, asymptomatic. Slightly bradycardic. . . no acute distress. Respiratory Normal work of breathing on room air. Notes 04/04/2023: The lateral leg wound is larger again today. The patient reports that he thinks maybe some dry skin pulled off when he was changing his dressing. It is quite dry and he states that he is using just water to moisten his endoform, rather than the hydrogel that was recommended. The foot wound is the same size, but the tissue is in better condition, without substantial bruising or friction-related trauma.  Cleanse the wound with wound cleanser prior to applying a clean dressing using gauze sponges, not tissue or cotton balls. Topical: Skintegrity Hydrogel 4 (oz) 1 x Per Day/30 Days Discharge Instructions: Apply hydrogel as directed Prim Dressing: Endoform 2x2 in 1 x Per Day/30 Days ary Discharge Instructions: Moisten with saline Secondary Dressing: Woven Gauze Sponge, Non-Sterile 4x4 in 1 x Per Day/30 Days Discharge Instructions: Apply over primary dressing as directed. Secured With: Elastic Bandage 4 inch (ACE bandage) 1 x Per Day/30 Days Discharge Instructions: Secure with ACE bandage as directed. Secured With: [employer], 4.5x3.1 (in/yd) 1 x Per Day/30 Days Discharge Instructions: Secure with Kerlix as directed. 04/04/2023: The lateral leg wound is larger again today. The patient reports that he thinks maybe some dry skin pulled off when he was changing his dressing. It is quite dry and he states that he is using just water to moisten his endoform, rather than the hydrogel that was recommended. The foot wound is the same size, but the tissue is in better condition, without substantial bruising or friction-related trauma. The lateral leg ulcer did not require debridement. We will continue endoform here, but I have asked him to go back to using hydrogel to keep it a little bit more moist. He also needs to cover it before wrapping with gauze and an Ace bandage. I debrided callus, skin, and slough from the foot ulcer. We are using silver alginate here. I do not think he needs topical antibiotic ointment right now. We are going to let him try changing the dressing daily at home.  Hopefully daily cleansing with antibacterial soap will help. Follow up in one week. Electronic Signature(s) Signed: 04/04/2023 11:23:45 AM By: [name] MD FACS Previous Signature: 04/04/2023 11:11:23 AM Version By: [name] MD FACS Previous Signature: 04/04/2023 10:51:14 AM Version By: [name] MD FACS Entered By: [name] on 04/04/2023 08:23:45 -------------------------------------------------------------------------------- HxROS Details Patient Name: Date of Service: [name]. 04/04/2023 10:00 A M Medical Record Number: [id number] Patient Account Number: [id number] Date of Birth/Sex: Treating RN: 22-Oct-1950 (72 y.o. M) Primary Care Provider: [name] Other Clinician: Referring Provider: Treating Provider/Extender: [name] in Treatment: 103 Information Obtained From Patient Eyes Medical History: Positive for: Glaucoma - both eyes Negative for: Cataracts; Optic Neuritis Ear/Nose/Mouth/Throat Medical History: Negative for: Chronic sinus problems/congestion; Middle ear problems Hematologic/Lymphatic Medical History: Negative for: Anemia; Hemophilia; Human Immunodeficiency Virus; Lymphedema; Sickle Cell Disease Respiratory Medical History: Positive for: Sleep Apnea - CPAP Negative for: Aspiration; Asthma; Chronic Obstructive Pulmonary Disease (COPD); Pneumothorax; Tuberculosis Cardiovascular [name], [name] ([id number]) [id number] Page 20 of 21 Medical History: Positive for: Hypertension; Peripheral Arterial Disease; Peripheral Venous Disease Negative for: Angina; Arrhythmia; Congestive Heart Failure; Coronary Artery Disease; Deep Vein Thrombosis; Hypotension; Myocardial Infarction; Phlebitis; Vasculitis Gastrointestinal Medical History: Negative for: Cirrhosis ; Colitis; Crohns; Hepatitis A; Hepatitis B; Hepatitis C Endocrine Medical History: Positive for: Type II  Diabetes Negative for: Type I Diabetes Time with diabetes: 13 years Treated with: Insulin, Oral agents Blood sugar tested every day: Yes Tested : 2x/day Genitourinary Medical History: Negative for: End Stage Renal Disease Past Medical History Notes: Stage 3 CKD Immunological Medical History: Negative for: Lupus Erythematosus; Raynauds; Scleroderma Integumentary (Skin) Medical History: Negative for: History of Burn Musculoskeletal Medical History: Positive for: Gout - left great toe; Osteoarthritis Negative for: Rheumatoid Arthritis; Osteomyelitis Neurologic Medical History: Positive for: Neuropathy Negative for: Dementia; Quadriplegia; Paraplegia; Seizure Disorder Oncologic Medical History: Negative for: Received Chemotherapy; Received Radiation Psychiatric Medical  Electronic Signature(s) Signed: 04/04/2023 10:48:57 AM By:  [name] MD FACS Entered By: [name] on 04/04/2023 07:48:57 -------------------------------------------------------------------------------- Physician Orders Details Patient Name: Date of Service: [name]. 04/04/2023 10:00 A M Medical Record Number: [id number] Patient Account Number: [id number] Date of Birth/Sex: Treating RN: 1951/02/28 (72 y.o. 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 - Dr Lady Gary - Room 2 Return A [name], [name] ([id number]) [id number] Page 9 of 21 Anesthetic (In clinic) Topical Lidocaine 5% applied to wound bed Bathing/ Shower/ Hygiene May shower and wash wound with soap and water. Edema Control - Lymphedema / SCD / Other Elevate legs to the level of the heart or above for 30 minutes daily and/or when sitting for 3-4 times a day throughout the day. Avoid standing for long periods of time. Patient to wear own compression stockings every day. - both legs daily Moisturize legs daily. Compression stocking or Garment 20-30 mm/Hg pressure to: Off-Loading Wound #18R Left,Plantar Metatarsal head first Open toe surgical shoe to: -  Front off loader Left ft Other: - minimal weight bearing left foot Additional Orders / Instructions Follow Nutritious Diet - vitamin C 500 mg 3 times a day and zinc 30-50 mg a day Wound Treatment Wound #18R - Metatarsal head first Wound Laterality: Plantar, Left Cleanser: Soap and Water 1 x Per Week/30 Days Discharge Instructions: May shower and wash wound with dial antibacterial soap and water prior to dressing change. Cleanser: Wound Cleanser 1 x Per Week/30 Days Discharge Instructions: Cleanse the wound with wound cleanser prior to applying a clean dressing using gauze sponges, not tissue or cotton balls. Prim Dressing: Maxorb Extra Ag+ Alginate Dressing, 2x2 (in/in) 1 x Per Week/30 Days ary Discharge Instructions: Apply to wound bed as instructed Secondary Dressing: Optifoam Non-Adhesive Dressing, 4x4 in (Generic) 1 x Per Week/30 Days Discharge Instructions: Apply over primary dressing as directed. Secondary Dressing: Woven Gauze Sponges 2x2 in (Generic) 1 x Per Week/30 Days Discharge Instructions: Apply over primary dressing as directed. Secured With: Elastic Bandage 4 inch (ACE bandage) 1 x Per Week/30 Days Discharge Instructions: Secure with ACE bandage as directed. Secured With: [employer], 4.5x3.1 (in/yd) 1 x Per Week/30 Days Discharge Instructions: Secure with Kerlix as directed. Secured With: 74M Medipore H Soft Cloth Surgical T ape, 4 x 10 (in/yd) 1 x Per Week/30 Days Discharge Instructions: Secure with tape as directed. Wound #22R - Lower Leg Wound Laterality: Left, Lateral, Proximal Cleanser: Soap and Water 1 x Per Day/30 Days Discharge Instructions: May shower and wash wound with dial antibacterial soap and water prior to dressing change. Cleanser: Wound Cleanser 1 x Per Day/30 Days Discharge Instructions: Cleanse the wound with wound cleanser prior to applying a clean dressing using gauze sponges, not tissue or cotton balls. Topical: Skintegrity Hydrogel 4 (oz) 1 x  Per Day/30 Days Discharge Instructions: Apply hydrogel as directed Prim Dressing: Endoform 2x2 in 1  causing some tissue breakdown. His foot wound is also a bit larger and the tissue does not appear particularly healthy. 02/07/2023: The lateral leg wound improved quite significantly over the last week. He has been applying additional gauze over the site before applying the Ace bandage and this seems to have minimized the rubbing. The foot wound is about the same. The culture that I took last week returned with a polymicrobial population of predominantly skin flora. He is currently taking Augmentin as recommended by the culture data. 02/14/2023: The lateral leg wound is covered in a layer of eschar. Underneath, the wound is nearly healed again. The foot wound looks better today. The depth around the edges has decreased. The tissue has a healthier appearance. He is still taking Augmentin. 02/21/2023: The lateral leg wound is smaller with a layer of eschar on the surface, but is not quite healed. The foot wound is stable but the depth around the edges is almost completely obliterated. There is some callus and senescent skin around the edges. 03/07/2023: The lateral leg wound is down to just a pinhole with a little bit of eschar on the surface. The foot wound demonstrates evidence that he has been walking excessively the past week and there is more callus with moisture underneath it, but the breakdown is limited to the callus layer and there is still intact skin underneath. 03/14/2023: The lateral leg wound is closed. The foot wound looks much improved this week. There is very minimal callus accumulation around the wound edges and the tissue surface appears healthier. It seems the adjustments made to his shoe by the [facility] were effective. 03/28/2023: The lateral leg wound has reopened. The patient reports that he was not moisturizing it as much as he probably should have been and it cracked and some skin peeled away. The foot wound shows signs of pressure-related trauma  with some bruising and increased callus. The patient admits to extensive walking this past week while taking his wife to medical appointments. He does have a knee scooter and it is unclear why he does not use it. 04/04/2023: The lateral leg wound is larger again today. The patient reports that he thinks maybe some dry skin pulled off when he was changing his dressing. [name], [name] ([id number]) [id number] Page 8 of 21 It is quite dry and he states that he is using just water to moisten his endoform, rather than the hydrogel that was recommended. The foot wound is the same size, but the tissue is in better condition, without substantial bruising or friction-related trauma. Electronic Signature(s) Signed: 04/04/2023 10:46:14 AM By: [name] MD FACS Entered By: [name] on 04/04/2023 07:46:14 -------------------------------------------------------------------------------- Physical Exam Details Patient Name: Date of Service: [name]. 04/04/2023 10:00 A M Medical Record Number: [id number] Patient Account Number: [id number] Date of Birth/Sex: Treating RN: 04/19/1951 (72 y.o. M) Primary Care Provider: [name] Other Clinician: Referring Provider: Treating Provider/Extender: [name] in Treatment: 103 Constitutional Hypertensive, asymptomatic. Slightly bradycardic. . . no acute distress. Respiratory Normal work of breathing on room air. Notes 04/04/2023: The lateral leg wound is larger again today. The patient reports that he thinks maybe some dry skin pulled off when he was changing his dressing. It is quite dry and he states that he is using just water to moisten his endoform, rather than the hydrogel that was recommended. The foot wound is the same size, but the tissue is in better condition, without substantial bruising or friction-related trauma.  causing some tissue breakdown. His foot wound is also a bit larger and the tissue does not appear particularly healthy. 02/07/2023: The lateral leg wound improved quite significantly over the last week. He has been applying additional gauze over the site before applying the Ace bandage and this seems to have minimized the rubbing. The foot wound is about the same. The culture that I took last week returned with a polymicrobial population of predominantly skin flora. He is currently taking Augmentin as recommended by the culture data. 02/14/2023: The lateral leg wound is covered in a layer of eschar. Underneath, the wound is nearly healed again. The foot wound looks better today. The depth around the edges has decreased. The tissue has a healthier appearance. He is still taking Augmentin. 02/21/2023: The lateral leg wound is smaller with a layer of eschar on the surface, but is not quite healed. The foot wound is stable but the depth around the edges is almost completely obliterated. There is some callus and senescent skin around the edges. 03/07/2023: The lateral leg wound is down to just a pinhole with a little bit of eschar on the surface. The foot wound demonstrates evidence that he has been walking excessively the past week and there is more callus with moisture underneath it, but the breakdown is limited to the callus layer and there is still intact skin underneath. 03/14/2023: The lateral leg wound is closed. The foot wound looks much improved this week. There is very minimal callus accumulation around the wound edges and the tissue surface appears healthier. It seems the adjustments made to his shoe by the [facility] were effective. 03/28/2023: The lateral leg wound has reopened. The patient reports that he was not moisturizing it as much as he probably should have been and it cracked and some skin peeled away. The foot wound shows signs of pressure-related trauma  with some bruising and increased callus. The patient admits to extensive walking this past week while taking his wife to medical appointments. He does have a knee scooter and it is unclear why he does not use it. 04/04/2023: The lateral leg wound is larger again today. The patient reports that he thinks maybe some dry skin pulled off when he was changing his dressing. [name], [name] ([id number]) [id number] Page 8 of 21 It is quite dry and he states that he is using just water to moisten his endoform, rather than the hydrogel that was recommended. The foot wound is the same size, but the tissue is in better condition, without substantial bruising or friction-related trauma. Electronic Signature(s) Signed: 04/04/2023 10:46:14 AM By: [name] MD FACS Entered By: [name] on 04/04/2023 07:46:14 -------------------------------------------------------------------------------- Physical Exam Details Patient Name: Date of Service: [name]. 04/04/2023 10:00 A M Medical Record Number: [id number] Patient Account Number: [id number] Date of Birth/Sex: Treating RN: 04/19/1951 (72 y.o. M) Primary Care Provider: [name] Other Clinician: Referring Provider: Treating Provider/Extender: [name] in Treatment: 103 Constitutional Hypertensive, asymptomatic. Slightly bradycardic. . . no acute distress. Respiratory Normal work of breathing on room air. Notes 04/04/2023: The lateral leg wound is larger again today. The patient reports that he thinks maybe some dry skin pulled off when he was changing his dressing. It is quite dry and he states that he is using just water to moisten his endoform, rather than the hydrogel that was recommended. The foot wound is the same size, but the tissue is in better condition, without substantial bruising or friction-related trauma.  x Per Day/30 Days ary Discharge Instructions: Moisten with saline Secondary Dressing: Woven Gauze Sponge, Non-Sterile 4x4 in 1 x Per Day/30 Days Discharge Instructions: Apply over primary dressing as directed. Secured With: Elastic Bandage 4 inch (ACE bandage) 1 x Per Day/30 Days Discharge Instructions: Secure with ACE bandage as directed. Secured With: [employer], 4.5x3.1 (in/yd) 1 x Per Day/30 Days Discharge Instructions: Secure with Kerlix as directed. Patient Medications llergies: No Known Drug Allergies A Notifications Medication Indication Start End 04/04/2023 lidocaine DOSE topical 5 % ointment - ointment topical [name], [name] ([id number]) [id number] Page 10 of 21 Electronic Signature(s) Signed: 04/04/2023 1:10:39 PM By: [name] MD FACS Entered By: [name] on 04/04/2023 07:51:00 -------------------------------------------------------------------------------- Problem List Details Patient Name: Date of Service: [name]. 04/04/2023 10:00 A M Medical Record Number: [id number] Patient Account Number: [id number] Date of Birth/Sex: Treating RN: Mar 16, 1951 (72 y.o. 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42 Type 2 diabetes mellitus with diabetic polyneuropathy 04/12/2021 04/12/2021 L02.416 Cutaneous abscess of left lower limb 06/13/2021 06/13/2021 Resolved Problems ICD-10 Code Description Active Date Resolved Date [name], [name] ([id number]) [id number] Page 11 of 21 [id number] Non-pressure chronic ulcer of other part of left lower leg with other specified severity 04/12/2021 04/12/2021 L97.828 Non-pressure chronic ulcer of other part of left lower leg with other specified severity 01/10/2023 06/08/2022 Electronic Signature(s) Signed: 04/04/2023 10:36:21 AM By: [name] MD FACS Entered By: [name] on 04/04/2023 07:36:21 -------------------------------------------------------------------------------- Progress Note Details Patient Name: Date of Service: [name]. 04/04/2023 10:00 A M Medical Record Number: [id number] Patient Account Number: [id number] Date of Birth/Sex: Treating RN: [birth year] (72 y.o. M) Primary Care Provider: [name] Other Clinician: Referring Provider: Treating Provider/Extender: [name] in Treatment: [phone] Subjective Chief Complaint Information obtained from Patient Left leg and foot ulcers 04/12/2021; patient is here for wounds on his left lower leg and left plantar foot over the first metatarsal head History of Present Illness (HPI) 10/11/17; Mr. Clos is a 72 year old man who tells me that in 2015 he slipped down the latter traumatizing his left leg. He developed a wound in the same spot the area that we are currently looking at. He states this closed over for the most part although he always felt it was somewhat unstable. In 2016 he hit  the same area with the door of his car had this reopened. He tells me that this is never really closed although sometimes an inflow it remains open on a constant basis. He has not been using any specific dressing to this except for topical antibiotics the nature of which were not really sure. His primary doctor did send him to see Dr. Jacinto Halim of interventional cardiology. He underwent an angiogram on 08/06/17 and he underwent a PTA and directional atherectomy of the  [name], [name] ([id number]) [id number] Page 1 of 21 Visit Report for 04/04/2023 Chief Complaint Document Details Patient Name: Date of Service: LONIE, [place] 04/04/2023 10:00 A M Medical Record Number: [id number] Patient Account Number: [id number] Date of Birth/Sex: Treating RN: 1951-03-12 (72 y.o. M) Primary Care Provider: [name] Other Clinician: Referring Provider: Treating Provider/Extender: [name] in Treatment: [phone] Information Obtained from: Patient Chief Complaint Left leg and foot ulcers 04/12/2021; patient is here for wounds on his left lower leg and left plantar foot over the first metatarsal head Electronic Signature(s) Signed: 04/04/2023 10:38:59 AM By: [name] MD FACS Entered By: [name] on 04/04/2023 07:38:59 -------------------------------------------------------------------------------- Debridement Details Patient Name: Date of Service: [name]. 04/04/2023 10:00 A M Medical Record Number: [id number] Patient Account Number: [id number] Date of Birth/Sex: Treating RN: [birth year] (72 y.o. [name] Primary Care Provider: [name] Other Clinician: Referring Provider: Treating Provider/Extender: [name] in Treatment: [phone] Debridement Performed for Assessment: Wound #18R Left,Plantar Metatarsal head first Performed By: Physician [name], MD The following information was scribed by: [name] The information was scribed for: [name] Debridement Type: Debridement Severity of Tissue Pre Debridement: Fat layer exposed Level of Consciousness (Pre-procedure): Awake and Alert Pre-procedure Verification/Time Out Yes - 10:20 Taken: Start Time: 10:20 Pain Control: Lidocaine 5% topical ointment Percent of Wound Bed Debrided: 100% T Area Debrided (cm): otal 2.31 Tissue and other material debrided: Non-Viable, Callus,  Slough, Skin: Epidermis, Slough Level: Skin/Epidermis Debridement Description: Selective/Open Wound Instrument: Curette Bleeding: Minimum Hemostasis Achieved: Pressure Response to Treatment: Procedure was tolerated well Level of Consciousness (Post- Awake and Alert procedure): Post Debridement Measurements of Total Wound Length: (cm) 1.4 Width: (cm) 2.1 Depth: (cm) 0.2 Volume: (cm) 0.462 Character of Wound/Ulcer Post Debridement: Improved [name] ([id number]) [id number] Page 2 of 21 Severity of Tissue Post Debridement: Fat layer exposed Post Procedure Diagnosis Same as Pre-procedure Electronic Signature(s) Signed: 04/04/2023 1:10:39 PM By: [name] MD FACS Signed: 04/04/2023 3:36:06 PM By: Gelene Mink By: [name] on 04/04/2023 07:23:23 -------------------------------------------------------------------------------- HPI Details Patient Name: Date of Service: [name]. 04/04/2023 10:00 A M Medical Record Number: [id number] Patient Account Number: [id number] Date of Birth/Sex: Treating RN: January 14, 1951 (72 y.o. M) Primary Care Provider: [name] Other Clinician: Referring Provider: Treating Provider/Extender: [name] in Treatment: 103 History of Present Illness HPI Description: 10/11/17; Mr. Nodal is a 72 year old man who tells me that in 2015 he slipped down the latter traumatizing his left leg. He developed a wound in the same spot the area that we are currently looking at. He states this closed over for the most part although he always felt it was somewhat unstable. In 2016 he hit the same area with the door of his car had this reopened. He tells me that this is never really closed although sometimes an inflow it remains open on a constant basis. He has not been using any specific dressing to this except for topical antibiotics the nature of which were not really sure. His  primary doctor did send him to see Dr. Jacinto Halim of interventional cardiology. He underwent an angiogram on 08/06/17 and he underwent a PTA and directional atherectomy of the lesser distal SFA and popliteal arteries which resulted in brisk improvement in blood flow. It was noted that he had 2 vessel runoff through the anterior tibial and peroneal. He is also been to see vascular and interventional radiologist. He was  [name], [name] ([id number]) [id number] Page 1 of 21 Visit Report for 04/04/2023 Chief Complaint Document Details Patient Name: Date of Service: LONIE, [place] 04/04/2023 10:00 A M Medical Record Number: [id number] Patient Account Number: [id number] Date of Birth/Sex: Treating RN: 1951-03-12 (72 y.o. M) Primary Care Provider: [name] Other Clinician: Referring Provider: Treating Provider/Extender: [name] in Treatment: [phone] Information Obtained from: Patient Chief Complaint Left leg and foot ulcers 04/12/2021; patient is here for wounds on his left lower leg and left plantar foot over the first metatarsal head Electronic Signature(s) Signed: 04/04/2023 10:38:59 AM By: [name] MD FACS Entered By: [name] on 04/04/2023 07:38:59 -------------------------------------------------------------------------------- Debridement Details Patient Name: Date of Service: [name]. 04/04/2023 10:00 A M Medical Record Number: [id number] Patient Account Number: [id number] Date of Birth/Sex: Treating RN: [birth year] (72 y.o. [name] Primary Care Provider: [name] Other Clinician: Referring Provider: Treating Provider/Extender: [name] in Treatment: [phone] Debridement Performed for Assessment: Wound #18R Left,Plantar Metatarsal head first Performed By: Physician [name], MD The following information was scribed by: [name] The information was scribed for: [name] Debridement Type: Debridement Severity of Tissue Pre Debridement: Fat layer exposed Level of Consciousness (Pre-procedure): Awake and Alert Pre-procedure Verification/Time Out Yes - 10:20 Taken: Start Time: 10:20 Pain Control: Lidocaine 5% topical ointment Percent of Wound Bed Debrided: 100% T Area Debrided (cm): otal 2.31 Tissue and other material debrided: Non-Viable, Callus,  Slough, Skin: Epidermis, Slough Level: Skin/Epidermis Debridement Description: Selective/Open Wound Instrument: Curette Bleeding: Minimum Hemostasis Achieved: Pressure Response to Treatment: Procedure was tolerated well Level of Consciousness (Post- Awake and Alert procedure): Post Debridement Measurements of Total Wound Length: (cm) 1.4 Width: (cm) 2.1 Depth: (cm) 0.2 Volume: (cm) 0.462 Character of Wound/Ulcer Post Debridement: Improved [name] ([id number]) [id number] Page 2 of 21 Severity of Tissue Post Debridement: Fat layer exposed Post Procedure Diagnosis Same as Pre-procedure Electronic Signature(s) Signed: 04/04/2023 1:10:39 PM By: [name] MD FACS Signed: 04/04/2023 3:36:06 PM By: Gelene Mink By: [name] on 04/04/2023 07:23:23 -------------------------------------------------------------------------------- HPI Details Patient Name: Date of Service: [name]. 04/04/2023 10:00 A M Medical Record Number: [id number] Patient Account Number: [id number] Date of Birth/Sex: Treating RN: January 14, 1951 (72 y.o. M) Primary Care Provider: [name] Other Clinician: Referring Provider: Treating Provider/Extender: [name] in Treatment: 103 History of Present Illness HPI Description: 10/11/17; Mr. Nodal is a 72 year old man who tells me that in 2015 he slipped down the latter traumatizing his left leg. He developed a wound in the same spot the area that we are currently looking at. He states this closed over for the most part although he always felt it was somewhat unstable. In 2016 he hit the same area with the door of his car had this reopened. He tells me that this is never really closed although sometimes an inflow it remains open on a constant basis. He has not been using any specific dressing to this except for topical antibiotics the nature of which were not really sure. His  primary doctor did send him to see Dr. Jacinto Halim of interventional cardiology. He underwent an angiogram on 08/06/17 and he underwent a PTA and directional atherectomy of the lesser distal SFA and popliteal arteries which resulted in brisk improvement in blood flow. It was noted that he had 2 vessel runoff through the anterior tibial and peroneal. He is also been to see vascular and interventional radiologist. He was  Electronic Signature(s) Signed: 04/04/2023 10:48:57 AM By:  [name] MD FACS Entered By: [name] on 04/04/2023 07:48:57 -------------------------------------------------------------------------------- Physician Orders Details Patient Name: Date of Service: [name]. 04/04/2023 10:00 A M Medical Record Number: [id number] Patient Account Number: [id number] Date of Birth/Sex: Treating RN: 1951/02/28 (72 y.o. 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 - Dr Lady Gary - Room 2 Return A [name], [name] ([id number]) [id number] Page 9 of 21 Anesthetic (In clinic) Topical Lidocaine 5% applied to wound bed Bathing/ Shower/ Hygiene May shower and wash wound with soap and water. Edema Control - Lymphedema / SCD / Other Elevate legs to the level of the heart or above for 30 minutes daily and/or when sitting for 3-4 times a day throughout the day. Avoid standing for long periods of time. Patient to wear own compression stockings every day. - both legs daily Moisturize legs daily. Compression stocking or Garment 20-30 mm/Hg pressure to: Off-Loading Wound #18R Left,Plantar Metatarsal head first Open toe surgical shoe to: -  Front off loader Left ft Other: - minimal weight bearing left foot Additional Orders / Instructions Follow Nutritious Diet - vitamin C 500 mg 3 times a day and zinc 30-50 mg a day Wound Treatment Wound #18R - Metatarsal head first Wound Laterality: Plantar, Left Cleanser: Soap and Water 1 x Per Week/30 Days Discharge Instructions: May shower and wash wound with dial antibacterial soap and water prior to dressing change. Cleanser: Wound Cleanser 1 x Per Week/30 Days Discharge Instructions: Cleanse the wound with wound cleanser prior to applying a clean dressing using gauze sponges, not tissue or cotton balls. Prim Dressing: Maxorb Extra Ag+ Alginate Dressing, 2x2 (in/in) 1 x Per Week/30 Days ary Discharge Instructions: Apply to wound bed as instructed Secondary Dressing: Optifoam Non-Adhesive Dressing, 4x4 in (Generic) 1 x Per Week/30 Days Discharge Instructions: Apply over primary dressing as directed. Secondary Dressing: Woven Gauze Sponges 2x2 in (Generic) 1 x Per Week/30 Days Discharge Instructions: Apply over primary dressing as directed. Secured With: Elastic Bandage 4 inch (ACE bandage) 1 x Per Week/30 Days Discharge Instructions: Secure with ACE bandage as directed. Secured With: [employer], 4.5x3.1 (in/yd) 1 x Per Week/30 Days Discharge Instructions: Secure with Kerlix as directed. Secured With: 74M Medipore H Soft Cloth Surgical T ape, 4 x 10 (in/yd) 1 x Per Week/30 Days Discharge Instructions: Secure with tape as directed. Wound #22R - Lower Leg Wound Laterality: Left, Lateral, Proximal Cleanser: Soap and Water 1 x Per Day/30 Days Discharge Instructions: May shower and wash wound with dial antibacterial soap and water prior to dressing change. Cleanser: Wound Cleanser 1 x Per Day/30 Days Discharge Instructions: Cleanse the wound with wound cleanser prior to applying a clean dressing using gauze sponges, not tissue or cotton balls. Topical: Skintegrity Hydrogel 4 (oz) 1 x  Per Day/30 Days Discharge Instructions: Apply hydrogel as directed Prim Dressing: Endoform 2x2 in 1  Cleanse the wound with wound cleanser prior to applying a clean dressing using gauze sponges, not tissue or cotton balls. Topical: Skintegrity Hydrogel 4 (oz) 1 x Per Day/30 Days Discharge Instructions: Apply hydrogel as directed Prim Dressing: Endoform 2x2 in 1 x Per Day/30 Days ary Discharge Instructions: Moisten with saline Secondary Dressing: Woven Gauze Sponge, Non-Sterile 4x4 in 1 x Per Day/30 Days Discharge Instructions: Apply over primary dressing as directed. Secured With: Elastic Bandage 4 inch (ACE bandage) 1 x Per Day/30 Days Discharge Instructions: Secure with ACE bandage as directed. Secured With: [employer], 4.5x3.1 (in/yd) 1 x Per Day/30 Days Discharge Instructions: Secure with Kerlix as directed. 04/04/2023: The lateral leg wound is larger again today. The patient reports that he thinks maybe some dry skin pulled off when he was changing his dressing. It is quite dry and he states that he is using just water to moisten his endoform, rather than the hydrogel that was recommended. The foot wound is the same size, but the tissue is in better condition, without substantial bruising or friction-related trauma. The lateral leg ulcer did not require debridement. We will continue endoform here, but I have asked him to go back to using hydrogel to keep it a little bit more moist. He also needs to cover it before wrapping with gauze and an Ace bandage. I debrided callus, skin, and slough from the foot ulcer. We are using silver alginate here. I do not think he needs topical antibiotic ointment right now. We are going to let him try changing the dressing daily at home.  Hopefully daily cleansing with antibacterial soap will help. Follow up in one week. Electronic Signature(s) Signed: 04/04/2023 11:23:45 AM By: [name] MD FACS Previous Signature: 04/04/2023 11:11:23 AM Version By: [name] MD FACS Previous Signature: 04/04/2023 10:51:14 AM Version By: [name] MD FACS Entered By: [name] on 04/04/2023 08:23:45 -------------------------------------------------------------------------------- HxROS Details Patient Name: Date of Service: [name]. 04/04/2023 10:00 A M Medical Record Number: [id number] Patient Account Number: [id number] Date of Birth/Sex: Treating RN: 22-Oct-1950 (72 y.o. M) Primary Care Provider: [name] Other Clinician: Referring Provider: Treating Provider/Extender: [name] in Treatment: 103 Information Obtained From Patient Eyes Medical History: Positive for: Glaucoma - both eyes Negative for: Cataracts; Optic Neuritis Ear/Nose/Mouth/Throat Medical History: Negative for: Chronic sinus problems/congestion; Middle ear problems Hematologic/Lymphatic Medical History: Negative for: Anemia; Hemophilia; Human Immunodeficiency Virus; Lymphedema; Sickle Cell Disease Respiratory Medical History: Positive for: Sleep Apnea - CPAP Negative for: Aspiration; Asthma; Chronic Obstructive Pulmonary Disease (COPD); Pneumothorax; Tuberculosis Cardiovascular [name], [name] ([id number]) [id number] Page 20 of 21 Medical History: Positive for: Hypertension; Peripheral Arterial Disease; Peripheral Venous Disease Negative for: Angina; Arrhythmia; Congestive Heart Failure; Coronary Artery Disease; Deep Vein Thrombosis; Hypotension; Myocardial Infarction; Phlebitis; Vasculitis Gastrointestinal Medical History: Negative for: Cirrhosis ; Colitis; Crohns; Hepatitis A; Hepatitis B; Hepatitis C Endocrine Medical History: Positive for: Type II  Diabetes Negative for: Type I Diabetes Time with diabetes: 13 years Treated with: Insulin, Oral agents Blood sugar tested every day: Yes Tested : 2x/day Genitourinary Medical History: Negative for: End Stage Renal Disease Past Medical History Notes: Stage 3 CKD Immunological Medical History: Negative for: Lupus Erythematosus; Raynauds; Scleroderma Integumentary (Skin) Medical History: Negative for: History of Burn Musculoskeletal Medical History: Positive for: Gout - left great toe; Osteoarthritis Negative for: Rheumatoid Arthritis; Osteomyelitis Neurologic Medical History: Positive for: Neuropathy Negative for: Dementia; Quadriplegia; Paraplegia; Seizure Disorder Oncologic Medical History: Negative for: Received Chemotherapy; Received Radiation Psychiatric Medical  x Per Day/30 Days ary Discharge Instructions: Moisten with saline Secondary Dressing: Woven Gauze Sponge, Non-Sterile 4x4 in 1 x Per Day/30 Days Discharge Instructions: Apply over primary dressing as directed. Secured With: Elastic Bandage 4 inch (ACE bandage) 1 x Per Day/30 Days Discharge Instructions: Secure with ACE bandage as directed. Secured With: [employer], 4.5x3.1 (in/yd) 1 x Per Day/30 Days Discharge Instructions: Secure with Kerlix as directed. Patient Medications llergies: No Known Drug Allergies A Notifications Medication Indication Start End 04/04/2023 lidocaine DOSE topical 5 % ointment - ointment topical [name], [name] ([id number]) [id number] Page 10 of 21 Electronic Signature(s) Signed: 04/04/2023 1:10:39 PM By: [name] MD FACS Entered By: [name] on 04/04/2023 07:51:00 -------------------------------------------------------------------------------- Problem List Details Patient Name: Date of Service: [name]. 04/04/2023 10:00 A M Medical Record Number: [id number] Patient Account Number: [id number] Date of Birth/Sex: Treating RN: Mar 16, 1951 (72 y.o. 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42 Type 2 diabetes mellitus with diabetic polyneuropathy 04/12/2021 04/12/2021 L02.416 Cutaneous abscess of left lower limb 06/13/2021 06/13/2021 Resolved Problems ICD-10 Code Description Active Date Resolved Date [name], [name] ([id number]) [id number] Page 11 of 21 [phone] Non-pressure chronic ulcer of other part of left lower leg with other specified severity 04/12/2021 04/12/2021 L97.828 Non-pressure chronic ulcer of other part of left lower leg with other specified severity 01/10/2023 06/08/2022 Electronic Signature(s) Signed: 04/04/2023 10:36:21 AM By: [name] MD FACS Entered By: [name] on 04/04/2023 07:36:21 -------------------------------------------------------------------------------- Progress Note Details Patient Name: Date of Service: [name]. 04/04/2023 10:00 A M Medical Record Number: [id number] Patient Account Number: [id number] Date of Birth/Sex: Treating RN: [birth year] (72 y.o. M) Primary Care Provider: [name] Other Clinician: Referring Provider: Treating Provider/Extender: [name] in Treatment: [phone] Subjective Chief Complaint Information obtained from Patient Left leg and foot ulcers 04/12/2021; patient is here for wounds on his left lower leg and left plantar foot over the first metatarsal head History of Present Illness (HPI) 10/11/17; Mr. Clos is a 72 year old man who tells me that in 2015 he slipped down the latter traumatizing his left leg. He developed a wound in the same spot the area that we are currently looking at. He states this closed over for the most part although he always felt it was somewhat unstable. In 2016 he hit  the same area with the door of his car had this reopened. He tells me that this is never really closed although sometimes an inflow it remains open on a constant basis. He has not been using any specific dressing to this except for topical antibiotics the nature of which were not really sure. His primary doctor did send him to see Dr. Jacinto Halim of interventional cardiology. He underwent an angiogram on 08/06/17 and he underwent a PTA and directional atherectomy of the  [name], [name] ([id number]) [id number] Page 1 of 21 Visit Report for 04/04/2023 Chief Complaint Document Details Patient Name: Date of Service: LONIE, [place] 04/04/2023 10:00 A M Medical Record Number: [id number] Patient Account Number: [id number] Date of Birth/Sex: Treating RN: 1951-03-12 (72 y.o. M) Primary Care Provider: [name] Other Clinician: Referring Provider: Treating Provider/Extender: [name] in Treatment: [phone] Information Obtained from: Patient Chief Complaint Left leg and foot ulcers 04/12/2021; patient is here for wounds on his left lower leg and left plantar foot over the first metatarsal head Electronic Signature(s) Signed: 04/04/2023 10:38:59 AM By: [name] MD FACS Entered By: [name] on 04/04/2023 07:38:59 -------------------------------------------------------------------------------- Debridement Details Patient Name: Date of Service: [name]. 04/04/2023 10:00 A M Medical Record Number: [id number] Patient Account Number: [id number] Date of Birth/Sex: Treating RN: [birth year] (72 y.o. [name] Primary Care Provider: [name] Other Clinician: Referring Provider: Treating Provider/Extender: [name] in Treatment: [phone] Debridement Performed for Assessment: Wound #18R Left,Plantar Metatarsal head first Performed By: Physician [name], MD The following information was scribed by: [name] The information was scribed for: [name] Debridement Type: Debridement Severity of Tissue Pre Debridement: Fat layer exposed Level of Consciousness (Pre-procedure): Awake and Alert Pre-procedure Verification/Time Out Yes - 10:20 Taken: Start Time: 10:20 Pain Control: Lidocaine 5% topical ointment Percent of Wound Bed Debrided: 100% T Area Debrided (cm): otal 2.31 Tissue and other material debrided: Non-Viable, Callus,  Slough, Skin: Epidermis, Slough Level: Skin/Epidermis Debridement Description: Selective/Open Wound Instrument: Curette Bleeding: Minimum Hemostasis Achieved: Pressure Response to Treatment: Procedure was tolerated well Level of Consciousness (Post- Awake and Alert procedure): Post Debridement Measurements of Total Wound Length: (cm) 1.4 Width: (cm) 2.1 Depth: (cm) 0.2 Volume: (cm) 0.462 Character of Wound/Ulcer Post Debridement: Improved [name] ([id number]) [id number] Page 2 of 21 Severity of Tissue Post Debridement: Fat layer exposed Post Procedure Diagnosis Same as Pre-procedure Electronic Signature(s) Signed: 04/04/2023 1:10:39 PM By: [name] MD FACS Signed: 04/04/2023 3:36:06 PM By: Gelene Mink By: [name] on 04/04/2023 07:23:23 -------------------------------------------------------------------------------- HPI Details Patient Name: Date of Service: [name]. 04/04/2023 10:00 A M Medical Record Number: [id number] Patient Account Number: [id number] Date of Birth/Sex: Treating RN: January 14, 1951 (72 y.o. M) Primary Care Provider: [name] Other Clinician: Referring Provider: Treating Provider/Extender: [name] in Treatment: 103 History of Present Illness HPI Description: 10/11/17; Mr. Nodal is a 72 year old man who tells me that in 2015 he slipped down the latter traumatizing his left leg. He developed a wound in the same spot the area that we are currently looking at. He states this closed over for the most part although he always felt it was somewhat unstable. In 2016 he hit the same area with the door of his car had this reopened. He tells me that this is never really closed although sometimes an inflow it remains open on a constant basis. He has not been using any specific dressing to this except for topical antibiotics the nature of which were not really sure. His  primary doctor did send him to see Dr. Jacinto Halim of interventional cardiology. He underwent an angiogram on 08/06/17 and he underwent a PTA and directional atherectomy of the lesser distal SFA and popliteal arteries which resulted in brisk improvement in blood flow. It was noted that he had 2 vessel runoff through the anterior tibial and peroneal. He is also been to see vascular and interventional radiologist. He was  causing some tissue breakdown. His foot wound is also a bit larger and the tissue does not appear particularly healthy. 02/07/2023: The lateral leg wound improved quite significantly over the last week. He has been applying additional gauze over the site before applying the Ace bandage and this seems to have minimized the rubbing. The foot wound is about the same. The culture that I took last week returned with a polymicrobial population of predominantly skin flora. He is currently taking Augmentin as recommended by the culture data. 02/14/2023: The lateral leg wound is covered in a layer of eschar. Underneath, the wound is nearly healed again. The foot wound looks better today. The depth around the edges has decreased. The tissue has a healthier appearance. He is still taking Augmentin. 02/21/2023: The lateral leg wound is smaller with a layer of eschar on the surface, but is not quite healed. The foot wound is stable but the depth around the edges is almost completely obliterated. There is some callus and senescent skin around the edges. 03/07/2023: The lateral leg wound is down to just a pinhole with a little bit of eschar on the surface. The foot wound demonstrates evidence that he has been walking excessively the past week and there is more callus with moisture underneath it, but the breakdown is limited to the callus layer and there is still intact skin underneath. 03/14/2023: The lateral leg wound is closed. The foot wound looks much improved this week. There is very minimal callus accumulation around the wound edges and the tissue surface appears healthier. It seems the adjustments made to his shoe by the [facility] were effective. 03/28/2023: The lateral leg wound has reopened. The patient reports that he was not moisturizing it as much as he probably should have been and it cracked and some skin peeled away. The foot wound shows signs of pressure-related trauma  with some bruising and increased callus. The patient admits to extensive walking this past week while taking his wife to medical appointments. He does have a knee scooter and it is unclear why he does not use it. 04/04/2023: The lateral leg wound is larger again today. The patient reports that he thinks maybe some dry skin pulled off when he was changing his dressing. [name], [name] ([id number]) [id number] Page 8 of 21 It is quite dry and he states that he is using just water to moisten his endoform, rather than the hydrogel that was recommended. The foot wound is the same size, but the tissue is in better condition, without substantial bruising or friction-related trauma. Electronic Signature(s) Signed: 04/04/2023 10:46:14 AM By: [name] MD FACS Entered By: [name] on 04/04/2023 07:46:14 -------------------------------------------------------------------------------- Physical Exam Details Patient Name: Date of Service: [name]. 04/04/2023 10:00 A M Medical Record Number: [id number] Patient Account Number: [id number] Date of Birth/Sex: Treating RN: 04/19/1951 (72 y.o. M) Primary Care Provider: [name] Other Clinician: Referring Provider: Treating Provider/Extender: [name] in Treatment: 103 Constitutional Hypertensive, asymptomatic. Slightly bradycardic. . . no acute distress. Respiratory Normal work of breathing on room air. Notes 04/04/2023: The lateral leg wound is larger again today. The patient reports that he thinks maybe some dry skin pulled off when he was changing his dressing. It is quite dry and he states that he is using just water to moisten his endoform, rather than the hydrogel that was recommended. The foot wound is the same size, but the tissue is in better condition, without substantial bruising or friction-related trauma.

## 2023-04-04 NOTE — Progress Notes (Signed)
AM By: [name] Guess MD FACS Entered By: [name] on 04/04/2023 07:38:50 -------------------------------------------------------------------------------- Multi-Disciplinary Care Plan Details Patient Name: Date of Service: [name], [name] 04/04/2023 10:00 A M Medical Record Number: [id number] Patient Account Number: [id number] Date of Birth/Sex: Treating RN: 10-17-1950 (72 y.o. [name] Primary Care [name]: [name] Other Clinician: Referring [name]: Treating [name]/Extender: [name] in Treatment: [address] [name] ([id number])  [id number] Page 5 of 10 Multidisciplinary Care Plan reviewed with physician Active Inactive Venous Leg Ulcer Nursing Diagnoses: Knowledge deficit related to disease process and management Potential for venous Insuffiency (use before diagnosis confirmed) Goals: Patient will maintain optimal edema control Date Initiated: 07/27/2021 Target Resolution Date: 04/26/2023 Goal Status: Active Interventions: Assess peripheral edema status every visit. Treatment Activities: Therapeutic compression applied : 07/27/2021 Notes: Wound/Skin Impairment Nursing Diagnoses: Impaired tissue integrity Knowledge deficit related to ulceration/compromised skin integrity Goals: Patient will have a decrease in wound volume by X% from date: (specify in notes) Date Initiated: 04/12/2021 Date Inactivated: 01/04/2022 Target Resolution Date: 04/23/2021 Goal Status: Met Patient/caregiver will verbalize understanding of skin care regimen Date Initiated: 01/04/2022 Target Resolution Date: 04/26/2023 Goal Status: Active Ulcer/skin breakdown will have a volume reduction of 30% by week 4 Date Initiated: 04/12/2021 Date Inactivated: 04/27/2021 Target Resolution Date: 04/27/2021 Goal Status: Unmet Unmet Reason: infection Ulcer/skin breakdown will have a volume reduction of 50% by week 8 Date Initiated: 04/27/2021 Date Inactivated: 06/29/2021 Target Resolution Date: 06/24/2021 Goal Status: Met Interventions: Assess patient/caregiver ability to obtain necessary supplies Assess patient/caregiver ability to perform ulcer/skin care regimen upon admission and as needed Assess ulceration(s) every visit Notes: Electronic Signature(s) Signed: 04/04/2023 3:36:06 PM By: [name] Entered By: [name] on 04/04/2023 07:35:03 -------------------------------------------------------------------------------- Pain Assessment Details Patient Name: Date of Service: [name].  04/04/2023 10:00 A M Medical Record Number: [id number] Patient Account Number: [id number] Date of Birth/Sex: Treating RN: Nov 23, 1950 (72 y.o. [name] Primary Care [name]: [name] Other Clinician: Referring [name]: Treating [name]/Extender: [name] in Treatment: [phone] Active Problems Location of Pain Severity and Description of Pain [name], [name] ([id number]) [phone].pdf Page 6 of 10 Patient Has Paino No Site Locations Rate the pain. Current Pain Level: 0 Pain Management and Medication Current Pain Management: Electronic Signature(s) Signed: 04/04/2023 3:36:06 PM By: [name] Entered By: [name] on 04/04/2023 07:02:54 -------------------------------------------------------------------------------- Patient/Caregiver Education Details Patient Name: Date of Service: [name] 10/10/2024andnbsp10:00 A M Medical Record Number: [id number] Patient Account Number: [id number] Date of Birth/Gender: Treating RN: [birth year] (72 y.o. [name] Primary Care Physician: [name] Other Clinician: Referring Physician: Treating Physician/Extender: [name] in Treatment: [phone] Education Assessment Education Provided To: Patient Education Topics Provided Wound/Skin Impairment: Methods: Explain/Verbal Responses: Reinforcements needed, State content correctly Electronic Signature(s) Signed: 04/04/2023 3:36:06 PM By: [name] Entered By: [name] on 04/04/2023 07:35:16 -------------------------------------------------------------------------------- Wound Assessment Details Patient Name: Date of Service: [name]. 04/04/2023 10:00 A M Medical Record Number: [id number] Patient Account Number: [id number] Date of Birth/Sex: Treating RN: 1950/09/08 (72 y.o. [name], [name], [name] ([id number])  [id number] Page 7 of 10 Primary Care [name]: [name] Other Clinician: Referring Janal [name]: Treating [name]/Extender: [name] in Treatment: 103 Wound Status Wound Number: 18R Primary Diabetic Wound/Ulcer of the Lower Extremity Etiology: Wound Location: Left, Plantar Metatarsal head first Wound Open Wounding Event: Gradually Appeared Status: Date Acquired: 08/23/2020 Comorbid Glaucoma, Sleep Apnea, Hypertension, Peripheral Arterial Disease, Weeks  Of Treatment: 103 History: Peripheral Venous Disease, Type II Diabetes, Gout, Osteoarthritis, Clustered Wound: No Neuropathy Photos Wound Measurements Length: (cm) 1.4 Width: (cm) 2.1 Depth: (cm) 0.2 Area: (cm) 2.309 Volume: (cm) 0.462 % Reduction in Area: 83.7% % Reduction in Volume: 83.7% Epithelialization: Small (1-33%) Tunneling: No Undermining: No Wound Description Classification: Grade 2 Wound Margin: Epibole Exudate Amount: Medium Exudate Type: Serosanguineous Exudate Color: red, brown Foul Odor After Cleansing: No Slough/Fibrino Yes Wound Bed Granulation Amount: Large (67-100%) Exposed Structure Granulation Quality: Red Fascia Exposed: No Necrotic Amount: Small (1-33%) Fat Layer (Subcutaneous Tissue) Exposed: Yes Necrotic Quality: Adherent Slough Tendon Exposed: No Muscle Exposed: No Joint Exposed: No Bone Exposed: No Periwound Skin Texture Texture Color No Abnormalities Noted: No No Abnormalities Noted: Yes Callus: Yes Temperature / Pain Temperature: No Abnormality Moisture No Abnormalities Noted: Yes Treatment Notes Wound #18R (Metatarsal head first) Wound Laterality: Plantar, Left Cleanser Soap and Water Discharge Instruction: May shower and wash wound with dial antibacterial soap and water prior to dressing change. Wound Cleanser Discharge Instruction: Cleanse the wound with wound cleanser prior to applying a clean dressing using gauze  sponges, not tissue or cotton balls. Peri-Wound Care Topical Primary Dressing Maxorb Extra Ag+ Alginate Dressing, 2x2 (in/in) Discharge Instruction: Apply to wound bed as instructed [name], [name] ([id number]) [id number] Page 8 of 10 Secondary Dressing Optifoam Non-Adhesive Dressing, 4x4 in Discharge Instruction: Apply over primary dressing as directed. Woven Gauze Sponges 2x2 in Discharge Instruction: Apply over primary dressing as directed. Secured With Elastic Bandage 4 inch (ACE bandage) Discharge Instruction: Secure with ACE bandage as directed. Kerlix Roll Sterile, 4.5x3.1 (in/yd) Discharge Instruction: Secure with Kerlix as directed. 4M Medipore H Soft Cloth Surgical T ape, 4 x 10 (in/yd) Discharge Instruction: Secure with tape as directed. Compression Wrap Compression Stockings Add-Ons Electronic Signature(s) Signed: 04/04/2023 3:36:06 PM By: [name] Entered By: [name] on 04/04/2023 07:14:55 -------------------------------------------------------------------------------- Wound Assessment Details Patient Name: Date of Service: [name]. 04/04/2023 10:00 A M Medical Record Number: [id number] Patient Account Number: [id number] Date of Birth/Sex: Treating RN: [birth year] (72 y.o. [name] Primary Care Janicia Monterrosa: [name] Other Clinician: Referring Skarlet Lyons: Treating Sapphire Tygart/Extender: [name] in Treatment: 103 Wound Status Wound Number: 22R Primary Cyst Etiology: Wound Location: Left, Proximal, Lateral Lower Leg Wound Open Wounding Event: Bump Status: Date Acquired: 06/03/2021 Comorbid Glaucoma, Sleep Apnea, Hypertension, Peripheral Arterial Disease, Weeks Of Treatment: 95 History: Peripheral Venous Disease, Type II Diabetes, Gout, Osteoarthritis, Clustered Wound: Yes Neuropathy Photos Wound Measurements Length: (cm) 2 Width: (cm) 1 Depth: (cm) 0.1 Area: (cm)  1.571 Volume: (cm) 0.157 % Reduction in Area: 4.7% % Reduction in Volume: 88.1% Epithelialization: Medium (34-66%) Tunneling: No Undermining: No Wound Description Classification: Full Thickness With Exposed Support Structures [name], [name] ([id number]) Wound Margin: Distinct, outline attached Exudate Amount: Medium Exudate Type: Serosanguineous Exudate Color: red, brown Foul Odor After Cleansing: No [id number] Page 9 of 10 Slough/Fibrino Yes Wound Bed Granulation Amount: Large (67-100%) Exposed Structure Granulation Quality: Red, Hyper-granulation Fascia Exposed: No Necrotic Amount: Small (1-33%) Fat Layer (Subcutaneous Tissue) Exposed: Yes Necrotic Quality: Adherent Slough Tendon Exposed: No Muscle Exposed: No Joint Exposed: No Bone Exposed: No Periwound Skin Texture Texture Color No Abnormalities Noted: No No Abnormalities Noted: Yes Scarring: Yes Temperature / Pain Temperature: No Abnormality Moisture No Abnormalities Noted: Yes Treatment Notes Wound #22R (Lower Leg) Wound Laterality: Left, Lateral, Proximal Cleanser Soap and Water Discharge Instruction: May shower and wash wound with dial antibacterial soap and water prior  [name], [name] ([id number]) [id number] Page 1 of 10 Visit Report for 04/04/2023 Arrival Information Details Patient Name: Date of Service: [name], [name] 04/04/2023 10:00 A M Medical Record Number: [id number] Patient Account Number: [id number] Date of Birth/Sex: Treating RN: Dec 24, 1950 (72 y.o. [name] Primary Care Ramon Zanders: [name] Other Clinician: Referring Japji Kok: Treating Billyjoe Go/Extender: [name] in Treatment: 103 Visit Information History Since Last Visit Added or deleted any medications: No Patient Arrived: Ambulatory Any new allergies or adverse reactions: No Arrival Time: 10:02 Had a fall or experienced change in No Accompanied By: self activities of daily living that may affect Transfer Assistance: None risk of falls: Patient Identification Verified: Yes Signs or symptoms of abuse/neglect since last visito No Secondary Verification Process Completed: Yes Hospitalized since last visit: No Patient Requires Transmission-Based Precautions: No Implantable device outside of the clinic excluding No Patient Has Alerts: Yes cellular tissue based products placed in the center since last visit: Has Dressing in Place as Prescribed: Yes Has Compression in Place as Prescribed: Yes Pain Present Now: No Electronic Signature(s) Signed: 04/04/2023 3:36:06 PM By: [name] Entered By: [name] on 04/04/2023 07:02:46 -------------------------------------------------------------------------------- Encounter Discharge Information Details Patient Name: Date of Service: [name]. 04/04/2023 10:00 A M Medical Record Number: [id number] Patient Account Number: [id number] Date of Birth/Sex: Treating RN: 1951/04/29 (72 y.o. [name] Primary Care Valbona Slabach: [name] Other Clinician: Referring Chrisie Jankovich: Treating Belem Hintze/Extender: [name] in  Treatment: [phone] Encounter Discharge Information Items Post Procedure Vitals Discharge Condition: Stable Temperature (F): 98 Ambulatory Status: Ambulatory Pulse (bpm): 55 Discharge Destination: Home Respiratory Rate (breaths/min): 18 Transportation: Private Auto Blood Pressure (mmHg): 158/82 Accompanied By: self Schedule Follow-up Appointment: Yes Clinical Summary of Care: Patient Declined Electronic Signature(s) Signed: 04/04/2023 3:36:06 PM By: [name] Entered By: [name] on 04/04/2023 07:35:59 [name] ([id number]) [id number] Page 2 of 10 -------------------------------------------------------------------------------- Lower Extremity Assessment Details Patient Name: Date of Service: [name], [name] 04/04/2023 10:00 A M Medical Record Number: [id number] Patient Account Number: [id number] Date of Birth/Sex: Treating RN: August 19, 1950 (72 y.o. [name] Primary Care Jaysiah Marchetta: [name] Other Clinician: Referring Raphel Stickles: Treating Brandin Dilday/Extender: [name] in Treatment: 103 Edema Assessment Assessed: [name]: No] [name]: No] Edema: [Left: Ye] [Right: s] Calf Left: Right: Point of Measurement: 41 cm From Medial Instep 42.3 cm Ankle Left: Right: Point of Measurement: 10 cm From Medial Instep 26 cm Vascular Assessment Pulses: Dorsalis Pedis Palpable: [Left:Yes] Extremity colors, hair growth, and conditions: Extremity Color: [Left:Hyperpigmented] Hair Growth on Extremity: [Left:No] Temperature of Extremity: [Left:Warm] Capillary Refill: [Left:< 3 seconds] Dependent Rubor: [Left:No No] Electronic Signature(s) Signed: 04/04/2023 3:36:06 PM By: [name] Entered By: [name] on 04/04/2023 07:11:54 -------------------------------------------------------------------------------- Multi Wound Chart Details Patient Name: Date of Service: [name]. 04/04/2023  10:00 A M Medical Record Number: [id number] Patient Account Number: [id number] Date of Birth/Sex: Treating RN: 09/27/1950 (73 y.o. M) Primary Care Johneric Mcfadden: [name] Other Clinician: Referring Faria Casella: Treating Dakiya Puopolo/Extender: [name] in Treatment: 103 Vital Signs Height(in): 74 Capillary Blood Glucose(mg/dl): 606 Weight(lbs): 301 Pulse(bpm): 55 Body Mass Index(BMI): 30.6 Blood Pressure(mmHg): 158/82 Temperature(F): 98 Respiratory Rate(breaths/min): 18 [18R:Photos:] [N/A:N/A [id number] Page 3 of 10] Left, Plantar Metatarsal head first Left, Proximal, Lateral Lower Leg N/A Wound Location: Gradually Appeared Bump N/A Wounding Event: Diabetic Wound/Ulcer of the Lower Cyst N/A Primary Etiology: Extremity Glaucoma, Sleep Apnea, Glaucoma, Sleep Apnea, N/A Comorbid History: Hypertension, Peripheral Arterial  Of Treatment: 103 History: Peripheral Venous Disease, Type II Diabetes, Gout, Osteoarthritis, Clustered Wound: No Neuropathy Photos Wound Measurements Length: (cm) 1.4 Width: (cm) 2.1 Depth: (cm) 0.2 Area: (cm) 2.309 Volume: (cm) 0.462 % Reduction in Area: 83.7% % Reduction in Volume: 83.7% Epithelialization: Small (1-33%) Tunneling: No Undermining: No Wound Description Classification: Grade 2 Wound Margin: Epibole Exudate Amount: Medium Exudate Type: Serosanguineous Exudate Color: red, brown Foul Odor After Cleansing: No Slough/Fibrino Yes Wound Bed Granulation Amount: Large (67-100%) Exposed Structure Granulation Quality: Red Fascia Exposed: No Necrotic Amount: Small (1-33%) Fat Layer (Subcutaneous Tissue) Exposed: Yes Necrotic Quality: Adherent Slough Tendon Exposed: No Muscle Exposed: No Joint Exposed: No Bone Exposed: No Periwound Skin Texture Texture Color No Abnormalities Noted: No No Abnormalities Noted: Yes Callus: Yes Temperature / Pain Temperature: No Abnormality Moisture No Abnormalities Noted: Yes Treatment Notes Wound #18R (Metatarsal head first) Wound Laterality: Plantar, Left Cleanser Soap and Water Discharge Instruction: May shower and wash wound with dial antibacterial soap and water prior to dressing change. Wound Cleanser Discharge Instruction: Cleanse the wound with wound cleanser prior to applying a clean dressing using gauze  sponges, not tissue or cotton balls. Peri-Wound Care Topical Primary Dressing Maxorb Extra Ag+ Alginate Dressing, 2x2 (in/in) Discharge Instruction: Apply to wound bed as instructed [name], [name] ([id number]) [id number] Page 8 of 10 Secondary Dressing Optifoam Non-Adhesive Dressing, 4x4 in Discharge Instruction: Apply over primary dressing as directed. Woven Gauze Sponges 2x2 in Discharge Instruction: Apply over primary dressing as directed. Secured With Elastic Bandage 4 inch (ACE bandage) Discharge Instruction: Secure with ACE bandage as directed. Kerlix Roll Sterile, 4.5x3.1 (in/yd) Discharge Instruction: Secure with Kerlix as directed. 4M Medipore H Soft Cloth Surgical T ape, 4 x 10 (in/yd) Discharge Instruction: Secure with tape as directed. Compression Wrap Compression Stockings Add-Ons Electronic Signature(s) Signed: 04/04/2023 3:36:06 PM By: [name] Entered By: [name] on 04/04/2023 07:14:55 -------------------------------------------------------------------------------- Wound Assessment Details Patient Name: Date of Service: [name]. 04/04/2023 10:00 A M Medical Record Number: [id number] Patient Account Number: [id number] Date of Birth/Sex: Treating RN: [birth year] (72 y.o. [name] Primary Care Janicia Monterrosa: [name] Other Clinician: Referring Skarlet Lyons: Treating Sapphire Tygart/Extender: [name] in Treatment: 103 Wound Status Wound Number: 22R Primary Cyst Etiology: Wound Location: Left, Proximal, Lateral Lower Leg Wound Open Wounding Event: Bump Status: Date Acquired: 06/03/2021 Comorbid Glaucoma, Sleep Apnea, Hypertension, Peripheral Arterial Disease, Weeks Of Treatment: 95 History: Peripheral Venous Disease, Type II Diabetes, Gout, Osteoarthritis, Clustered Wound: Yes Neuropathy Photos Wound Measurements Length: (cm) 2 Width: (cm) 1 Depth: (cm) 0.1 Area: (cm)  1.571 Volume: (cm) 0.157 % Reduction in Area: 4.7% % Reduction in Volume: 88.1% Epithelialization: Medium (34-66%) Tunneling: No Undermining: No Wound Description Classification: Full Thickness With Exposed Support Structures [name], [name] ([id number]) Wound Margin: Distinct, outline attached Exudate Amount: Medium Exudate Type: Serosanguineous Exudate Color: red, brown Foul Odor After Cleansing: No [id number] Page 9 of 10 Slough/Fibrino Yes Wound Bed Granulation Amount: Large (67-100%) Exposed Structure Granulation Quality: Red, Hyper-granulation Fascia Exposed: No Necrotic Amount: Small (1-33%) Fat Layer (Subcutaneous Tissue) Exposed: Yes Necrotic Quality: Adherent Slough Tendon Exposed: No Muscle Exposed: No Joint Exposed: No Bone Exposed: No Periwound Skin Texture Texture Color No Abnormalities Noted: No No Abnormalities Noted: Yes Scarring: Yes Temperature / Pain Temperature: No Abnormality Moisture No Abnormalities Noted: Yes Treatment Notes Wound #22R (Lower Leg) Wound Laterality: Left, Lateral, Proximal Cleanser Soap and Water Discharge Instruction: May shower and wash wound with dial antibacterial soap and water prior  AM By: [name] Guess MD FACS Entered By: [name] on 04/04/2023 07:38:50 -------------------------------------------------------------------------------- Multi-Disciplinary Care Plan Details Patient Name: Date of Service: [name], [name] 04/04/2023 10:00 A M Medical Record Number: [id number] Patient Account Number: [id number] Date of Birth/Sex: Treating RN: 10-17-1950 (72 y.o. [name] Primary Care [name]: [name] Other Clinician: Referring [name]: Treating [name]/Extender: [name] in Treatment: [address] [name] ([id number])  [id number] Page 5 of 10 Multidisciplinary Care Plan reviewed with physician Active Inactive Venous Leg Ulcer Nursing Diagnoses: Knowledge deficit related to disease process and management Potential for venous Insuffiency (use before diagnosis confirmed) Goals: Patient will maintain optimal edema control Date Initiated: 07/27/2021 Target Resolution Date: 04/26/2023 Goal Status: Active Interventions: Assess peripheral edema status every visit. Treatment Activities: Therapeutic compression applied : 07/27/2021 Notes: Wound/Skin Impairment Nursing Diagnoses: Impaired tissue integrity Knowledge deficit related to ulceration/compromised skin integrity Goals: Patient will have a decrease in wound volume by X% from date: (specify in notes) Date Initiated: 04/12/2021 Date Inactivated: 01/04/2022 Target Resolution Date: 04/23/2021 Goal Status: Met Patient/caregiver will verbalize understanding of skin care regimen Date Initiated: 01/04/2022 Target Resolution Date: 04/26/2023 Goal Status: Active Ulcer/skin breakdown will have a volume reduction of 30% by week 4 Date Initiated: 04/12/2021 Date Inactivated: 04/27/2021 Target Resolution Date: 04/27/2021 Goal Status: Unmet Unmet Reason: infection Ulcer/skin breakdown will have a volume reduction of 50% by week 8 Date Initiated: 04/27/2021 Date Inactivated: 06/29/2021 Target Resolution Date: 06/24/2021 Goal Status: Met Interventions: Assess patient/caregiver ability to obtain necessary supplies Assess patient/caregiver ability to perform ulcer/skin care regimen upon admission and as needed Assess ulceration(s) every visit Notes: Electronic Signature(s) Signed: 04/04/2023 3:36:06 PM By: [name] Entered By: [name] on 04/04/2023 07:35:03 -------------------------------------------------------------------------------- Pain Assessment Details Patient Name: Date of Service: [name].  04/04/2023 10:00 A M Medical Record Number: [id number] Patient Account Number: [id number] Date of Birth/Sex: Treating RN: Nov 23, 1950 (72 y.o. [name] Primary Care [name]: [name] Other Clinician: Referring [name]: Treating [name]/Extender: [name] in Treatment: [phone] Active Problems Location of Pain Severity and Description of Pain [name], [name] ([id number]) [phone].pdf Page 6 of 10 Patient Has Paino No Site Locations Rate the pain. Current Pain Level: 0 Pain Management and Medication Current Pain Management: Electronic Signature(s) Signed: 04/04/2023 3:36:06 PM By: [name] Entered By: [name] on 04/04/2023 07:02:54 -------------------------------------------------------------------------------- Patient/Caregiver Education Details Patient Name: Date of Service: [name] 10/10/2024andnbsp10:00 A M Medical Record Number: [id number] Patient Account Number: [id number] Date of Birth/Gender: Treating RN: [birth year] (72 y.o. [name] Primary Care Physician: [name] Other Clinician: Referring Physician: Treating Physician/Extender: [name] in Treatment: [phone] Education Assessment Education Provided To: Patient Education Topics Provided Wound/Skin Impairment: Methods: Explain/Verbal Responses: Reinforcements needed, State content correctly Electronic Signature(s) Signed: 04/04/2023 3:36:06 PM By: [name] Entered By: [name] on 04/04/2023 07:35:16 -------------------------------------------------------------------------------- Wound Assessment Details Patient Name: Date of Service: [name]. 04/04/2023 10:00 A M Medical Record Number: [id number] Patient Account Number: [id number] Date of Birth/Sex: Treating RN: 1950/09/08 (72 y.o. [name], [name], [name] ([id number])  [id number] Page 7 of 10 Primary Care [name]: [name] Other Clinician: Referring Janal [name]: Treating [name]/Extender: [name] in Treatment: 103 Wound Status Wound Number: 18R Primary Diabetic Wound/Ulcer of the Lower Extremity Etiology: Wound Location: Left, Plantar Metatarsal head first Wound Open Wounding Event: Gradually Appeared Status: Date Acquired: 08/23/2020 Comorbid Glaucoma, Sleep Apnea, Hypertension, Peripheral Arterial Disease, Weeks
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
## 2023-04-11 ENCOUNTER — Encounter (HOSPITAL_BASED_OUTPATIENT_CLINIC_OR_DEPARTMENT_OTHER): Payer: Medicare Other | Admitting: General Surgery

## 2023-04-11 DIAGNOSIS — E11621 Type 2 diabetes mellitus with foot ulcer: Secondary | ICD-10-CM | POA: Diagnosis not present

## 2023-04-11 NOTE — Progress Notes (Signed)
[name], [name] ([id number]) [id number] Page 1 of 22 Visit Report for 04/11/2023 Chief Complaint Document Details Patient Name: Date of Service: [name], [name] 04/11/2023 10:00 A M Medical Record Number: [id number] Patient Account Number: [id number] Date of Birth/Sex: Treating RN: 08-28-1950 (72 y.o. M) Primary Care Provider: [name] Other Clinician: Referring Provider: Treating Provider/Extender: [name] in Treatment: 104 Information Obtained from: Patient Chief Complaint Left leg and foot ulcers 04/12/2021; patient is here for wounds on his left lower leg and left plantar foot over the first metatarsal head Electronic Signature(s) Signed: 04/11/2023 10:13:13 AM By: [name] MD FACS Entered By: [name] on 04/11/2023 07:13:13 -------------------------------------------------------------------------------- Debridement Details Patient Name: Date of Service: [name]. 04/11/2023 10:00 A M Medical Record Number: [id number] Patient Account Number: [id number] Date of Birth/Sex: Treating RN: [birth year] (72 y.o. [name] Primary Care Provider: [name] Other Clinician: Referring Provider: Treating Provider/Extender: [name] in Treatment: 104 Debridement Performed for Assessment: Wound #22R Left,Proximal,Lateral Lower Leg Performed By: Physician [name], MD The following information was scribed by: [name] The information was scribed for: [name] Debridement Type: Debridement Level of Consciousness (Pre-procedure): Awake and Alert Pre-procedure Verification/Time Out Yes - 10:01 Taken: Start Time: 10:01 Pain Control: Lidocaine 4% Topical Solution Percent of Wound Bed Debrided: 100% T Area Debrided (cm): otal 0.66 Tissue and other material debrided: Non-Viable, Eschar, Slough, Slough Level: Non-Viable Tissue Debridement  Description: Selective/Open Wound Instrument: Curette Bleeding: Minimum Hemostasis Achieved: Pressure Response to Treatment: Procedure was tolerated well Level of Consciousness (Post- Awake and Alert procedure): Post Debridement Measurements of Total Wound Length: (cm) 1.4 Width: (cm) 0.6 Depth: (cm) 0.1 Volume: (cm) 0.066 Character of Wound/Ulcer Post Debridement: Improved [name] ([id number]) [id number] Page 2 of 22 Post Procedure Diagnosis Same as Pre-procedure Electronic Signature(s) Signed: 04/11/2023 11:04:27 AM By: [name] MD FACS Signed: 04/11/2023 3:56:28 PM By: [name] Entered By: [name] on 04/11/2023 07:01:29 -------------------------------------------------------------------------------- Debridement Details Patient Name: Date of Service: [name]. 04/11/2023 10:00 A M Medical Record Number: [id number] Patient Account Number: [id number] Date of Birth/Sex: Treating RN: [birth year] (72 y.o. [name] Primary Care Provider: [name] Other Clinician: Referring Provider: Treating Provider/Extender: [name] in Treatment: 104 Debridement Performed for Assessment: Wound #18R Left,Plantar Metatarsal head first Performed By: Physician [name], MD The following information was scribed by: [name] The information was scribed for: [name] Debridement Type: Debridement Severity of Tissue Pre Debridement: Fat layer exposed Level of Consciousness (Pre-procedure): Awake and Alert Pre-procedure Verification/Time Out Yes - 10:01 Taken: Start Time: 10:01 Pain Control: Lidocaine 4% Topical Solution Percent of Wound Bed Debrided: 100% T Area Debrided (cm): otal 2.2 Tissue and other material debrided: Non-Viable, Callus, Slough, Slough Level: Non-Viable Tissue Debridement Description: Selective/Open Wound Instrument: Curette Bleeding:  Minimum Hemostasis Achieved: Pressure Response to Treatment: Procedure was tolerated well Level of Consciousness (Post- Awake and Alert procedure): Post Debridement Measurements of Total Wound Length: (cm) 1.4 Width: (cm) 2 Depth: (cm) 0.2 Volume: (cm) 0.44 Character of Wound/Ulcer Post Debridement: Improved Severity of Tissue Post Debridement: Fat layer exposed Post Procedure Diagnosis Same as Pre-procedure Electronic Signature(s) Signed: 04/11/2023 11:04:27 AM By: [name] MD FACS Signed: 04/11/2023 3:56:28 PM By: [name] Entered By: [name] on 04/11/2023 07:03:23 HPI Details -------------------------------------------------------------------------------- [name] ([id number]) [id number] Page 3 of 22 Patient Name: Date of Service: [name], [name]. 04/11/2023 10:00 A M Medical Record  [name], [name] ([id number]) [id number] Page 1 of 22 Visit Report for 04/11/2023 Chief Complaint Document Details Patient Name: Date of Service: [name], [name] 04/11/2023 10:00 A M Medical Record Number: [id number] Patient Account Number: [id number] Date of Birth/Sex: Treating RN: 08-28-1950 (72 y.o. M) Primary Care Provider: [name] Other Clinician: Referring Provider: Treating Provider/Extender: [name] in Treatment: 104 Information Obtained from: Patient Chief Complaint Left leg and foot ulcers 04/12/2021; patient is here for wounds on his left lower leg and left plantar foot over the first metatarsal head Electronic Signature(s) Signed: 04/11/2023 10:13:13 AM By: [name] MD FACS Entered By: [name] on 04/11/2023 07:13:13 -------------------------------------------------------------------------------- Debridement Details Patient Name: Date of Service: [name]. 04/11/2023 10:00 A M Medical Record Number: [id number] Patient Account Number: [id number] Date of Birth/Sex: Treating RN: [birth year] (72 y.o. [name] Primary Care Provider: [name] Other Clinician: Referring Provider: Treating Provider/Extender: [name] in Treatment: 104 Debridement Performed for Assessment: Wound #22R Left,Proximal,Lateral Lower Leg Performed By: Physician [name], MD The following information was scribed by: [name] The information was scribed for: [name] Debridement Type: Debridement Level of Consciousness (Pre-procedure): Awake and Alert Pre-procedure Verification/Time Out Yes - 10:01 Taken: Start Time: 10:01 Pain Control: Lidocaine 4% Topical Solution Percent of Wound Bed Debrided: 100% T Area Debrided (cm): otal 0.66 Tissue and other material debrided: Non-Viable, Eschar, Slough, Slough Level: Non-Viable Tissue Debridement  Description: Selective/Open Wound Instrument: Curette Bleeding: Minimum Hemostasis Achieved: Pressure Response to Treatment: Procedure was tolerated well Level of Consciousness (Post- Awake and Alert procedure): Post Debridement Measurements of Total Wound Length: (cm) 1.4 Width: (cm) 0.6 Depth: (cm) 0.1 Volume: (cm) 0.066 Character of Wound/Ulcer Post Debridement: Improved [name] ([id number]) [id number] Page 2 of 22 Post Procedure Diagnosis Same as Pre-procedure Electronic Signature(s) Signed: 04/11/2023 11:04:27 AM By: [name] MD FACS Signed: 04/11/2023 3:56:28 PM By: [name] Entered By: [name] on 04/11/2023 07:01:29 -------------------------------------------------------------------------------- Debridement Details Patient Name: Date of Service: [name]. 04/11/2023 10:00 A M Medical Record Number: [id number] Patient Account Number: [id number] Date of Birth/Sex: Treating RN: [birth year] (72 y.o. [name] Primary Care Provider: [name] Other Clinician: Referring Provider: Treating Provider/Extender: [name] in Treatment: 104 Debridement Performed for Assessment: Wound #18R Left,Plantar Metatarsal head first Performed By: Physician [name], MD The following information was scribed by: [name] The information was scribed for: [name] Debridement Type: Debridement Severity of Tissue Pre Debridement: Fat layer exposed Level of Consciousness (Pre-procedure): Awake and Alert Pre-procedure Verification/Time Out Yes - 10:01 Taken: Start Time: 10:01 Pain Control: Lidocaine 4% Topical Solution Percent of Wound Bed Debrided: 100% T Area Debrided (cm): otal 2.2 Tissue and other material debrided: Non-Viable, Callus, Slough, Slough Level: Non-Viable Tissue Debridement Description: Selective/Open Wound Instrument: Curette Bleeding:  Minimum Hemostasis Achieved: Pressure Response to Treatment: Procedure was tolerated well Level of Consciousness (Post- Awake and Alert procedure): Post Debridement Measurements of Total Wound Length: (cm) 1.4 Width: (cm) 2 Depth: (cm) 0.2 Volume: (cm) 0.44 Character of Wound/Ulcer Post Debridement: Improved Severity of Tissue Post Debridement: Fat layer exposed Post Procedure Diagnosis Same as Pre-procedure Electronic Signature(s) Signed: 04/11/2023 11:04:27 AM By: [name] MD FACS Signed: 04/11/2023 3:56:28 PM By: [name] Entered By: [name] on 04/11/2023 07:03:23 HPI Details -------------------------------------------------------------------------------- [name] ([id number]) [id number] Page 3 of 22 Patient Name: Date of Service: [name], [name]. 04/11/2023 10:00 A M Medical Record  dry skin pulled off when he was changing his dressing. It is quite dry and he states that he is using just water to moisten his endoform, rather than the hydrogel that was recommended. The foot wound is the same size, but the tissue is in better condition, without substantial bruising or friction-related trauma. 04/11/2023: Both wounds look better this week. Much of the lateral leg wound has epithelialized. The moisture balance is much better. The foot wound looks better than it has in months. The granulation tissue is flush with the surrounding skin and there is not much callus accumulation. Electronic Signature(s) Signed: 04/11/2023 10:16:35 AM By: [name] MD FACS Entered By: [name] on 04/11/2023 07:16:35 -------------------------------------------------------------------------------- Physical Exam Details Patient Name: Date of Service: [name]. 04/11/2023 10:00 A M Medical Record Number: [id number] Patient Account Number: [id number] [name], [name] ([id number]) [id number] Page 9 of 22 Date of Birth/Sex: Treating RN: 12-23-1950 (72 y.o. M) Primary Care Provider: Other Clinician: [name] Referring Provider: Treating Provider/Extender: [name] in Treatment: 104 Constitutional . Slightly bradycardic. . . no acute distress. Respiratory Normal work of breathing on room air. Notes 04/11/2023: Both wounds look better this week. Much of the lateral leg wound has epithelialized. The moisture balance is much better. The foot wound looks better than it has in months. The granulation tissue is flush with the surrounding skin and there is not much callus accumulation. Electronic Signature(s) Signed: 04/11/2023 10:17:12 AM By: [name]  MD FACS Entered By: [name] on 04/11/2023 07:17:11 -------------------------------------------------------------------------------- Physician Orders Details Patient Name: Date of Service: [name]. 04/11/2023 10:00 A M Medical Record Number: [id number] Patient Account Number: [id number] Date of Birth/Sex: Treating RN: [birth year] (72 y.o. 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 - Dr Lady Gary - Room 2 Return A Anesthetic (In clinic) Topical Lidocaine 4% applied to wound bed Bathing/ Shower/ Hygiene May shower and wash wound with soap and water. Edema Control - Lymphedema / SCD / Other Elevate legs to the level of the heart or above for 30 minutes daily and/or when sitting for 3-4 times a day throughout the day. Avoid standing for long periods of time. Patient to wear own compression stockings every day. - both legs daily Moisturize legs daily. Compression stocking or Garment 20-30 mm/Hg pressure to: Off-Loading Wound #18R Left,Plantar Metatarsal head first Open toe surgical shoe to: - Front off loader Left ft Other: - minimal weight bearing left foot Additional Orders /  Instructions Follow Nutritious Diet - vitamin C 500 mg 3 times a day and zinc 30-50 mg a day [name], [name] ([id number]) [id number] Page 10 of 22 Wound Treatment Wound #18R - Metatarsal head first Wound Laterality: Plantar, Left Cleanser: Soap and Water 1 x Per Week/30 Days Discharge Instructions: May shower and wash wound with dial antibacterial soap and water prior to dressing change. Cleanser: Wound Cleanser 1  dry skin pulled off when he was changing his dressing. It is quite dry and he states that he is using just water to moisten his endoform, rather than the hydrogel that was recommended. The foot wound is the same size, but the tissue is in better condition, without substantial bruising or friction-related trauma. 04/11/2023: Both wounds look better this week. Much of the lateral leg wound has epithelialized. The moisture balance is much better. The foot wound looks better than it has in months. The granulation tissue is flush with the surrounding skin and there is not much callus accumulation. Electronic Signature(s) Signed: 04/11/2023 10:16:35 AM By: [name] MD FACS Entered By: [name] on 04/11/2023 07:16:35 -------------------------------------------------------------------------------- Physical Exam Details Patient Name: Date of Service: [name]. 04/11/2023 10:00 A M Medical Record Number: [id number] Patient Account Number: [id number] [name], [name] ([id number]) [id number] Page 9 of 22 Date of Birth/Sex: Treating RN: 12-23-1950 (72 y.o. M) Primary Care Provider: Other Clinician: [name] Referring Provider: Treating Provider/Extender: [name] in Treatment: 104 Constitutional . Slightly bradycardic. . . no acute distress. Respiratory Normal work of breathing on room air. Notes 04/11/2023: Both wounds look better this week. Much of the lateral leg wound has epithelialized. The moisture balance is much better. The foot wound looks better than it has in months. The granulation tissue is flush with the surrounding skin and there is not much callus accumulation. Electronic Signature(s) Signed: 04/11/2023 10:17:12 AM By: [name]  MD FACS Entered By: [name] on 04/11/2023 07:17:11 -------------------------------------------------------------------------------- Physician Orders Details Patient Name: Date of Service: [name]. 04/11/2023 10:00 A M Medical Record Number: [id number] Patient Account Number: [id number] Date of Birth/Sex: Treating RN: [birth year] (72 y.o. 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 - Dr Lady Gary - Room 2 Return A Anesthetic (In clinic) Topical Lidocaine 4% applied to wound bed Bathing/ Shower/ Hygiene May shower and wash wound with soap and water. Edema Control - Lymphedema / SCD / Other Elevate legs to the level of the heart or above for 30 minutes daily and/or when sitting for 3-4 times a day throughout the day. Avoid standing for long periods of time. Patient to wear own compression stockings every day. - both legs daily Moisturize legs daily. Compression stocking or Garment 20-30 mm/Hg pressure to: Off-Loading Wound #18R Left,Plantar Metatarsal head first Open toe surgical shoe to: - Front off loader Left ft Other: - minimal weight bearing left foot Additional Orders /  Instructions Follow Nutritious Diet - vitamin C 500 mg 3 times a day and zinc 30-50 mg a day [name], [name] ([id number]) [id number] Page 10 of 22 Wound Treatment Wound #18R - Metatarsal head first Wound Laterality: Plantar, Left Cleanser: Soap and Water 1 x Per Week/30 Days Discharge Instructions: May shower and wash wound with dial antibacterial soap and water prior to dressing change. Cleanser: Wound Cleanser 1  Number: [id number] Patient Account Number: [id number] Date of Birth/Sex: Treating RN: 1951/06/18 (72 y.o. M) Primary Care Provider: [name] Other Clinician: Referring Provider: Treating Provider/Extender: [name] in Treatment: 104 History of Present Illness HPI Description: 10/11/17; Mr. Mccambridge is a 72 year old man who tells me that in 2015 he slipped down the latter traumatizing his left leg. He developed a wound in the same spot the area that we are currently looking at. He states this closed over for the most part although he always felt it was somewhat unstable. In 2016 he hit the same area with the door of his car had this reopened. He tells me that this is never really closed although sometimes an inflow it remains open on a constant basis. He has not been using any specific dressing to this except for topical antibiotics the nature of which were not really sure. His primary doctor did send him to see Dr. Jacinto Halim of interventional cardiology. He underwent an angiogram on 08/06/17 and he underwent a PTA and  directional atherectomy of the lesser distal SFA and popliteal arteries which resulted in brisk improvement in blood flow. It was noted that he had 2 vessel runoff through the anterior tibial and peroneal. He is also been to see vascular and interventional radiologist. He was not felt to have any significant superficial venous insufficiency. Presumably is not a candidate for any ablation. It was suggested he come here for wound care. The patient is a type II diabetic on insulin. He also has a history of venous insufficiency. ABIs on the left were noncompressible in our clinic 10/21/17; patient we admitted to the clinic last week. 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 We put Iodosorb on him after an aggressive debridement and 3 layer compression. He complained of pain in his ankle and itching with is skin in fact he scratched the area on the medial calf superiorly at the rim of our wraps and he has 2 small open areas in that location today which are new. I changed his primary dressing today to silver collagen. As noted he is already had revascularization and does not have any significant superficial venous insufficiency that would be amenable to ablation 10/28/17; patient admitted to the clinic 2 weeks ago. He has a smaller Wound. Scratch injury from last week revealed. There is large wound over the tibial area. This is smaller. Granulation looks healthy. No need for debridement. 11/04/17; the wound on the left lateral calf looks better. Improved dimensions. Surface of this looks better. We've been maintaining him and Kerlix Coban wraps. He finds this much more comfortable. Silver collagen dressing 11/11/17; left lateral Wound continues to look healthy be making progress. Using a #5 curet I removed removed nonviable skin from the surface of the wound and then necrotic debris from the wound surface. Surface of the wound continues to look  healthy. He also has an open area on the left great toenail bed. We've been using topical antibiotics. 11/19/17; left anterior lateral wound continues to look healthy but it's not closed. He also had a small wound above this on the left leg Initially traumatic wounds in the setting of significant chronic venous insufficiency and stasis dermatitis 11/25/17; left anterior wounds superiorly is closed still a small wound inferiorly. 12/02/17; left anterior tibial area. Arrives today with adherent callus. Post debridement clearly not completely closed. Hydrofera Blue under 3 layer compression. 12/09/17; left anterior tibia.  Number: [id number] Patient Account Number: [id number] Date of Birth/Sex: Treating RN: 1951/06/18 (72 y.o. M) Primary Care Provider: [name] Other Clinician: Referring Provider: Treating Provider/Extender: [name] in Treatment: 104 History of Present Illness HPI Description: 10/11/17; Mr. Mccambridge is a 72 year old man who tells me that in 2015 he slipped down the latter traumatizing his left leg. He developed a wound in the same spot the area that we are currently looking at. He states this closed over for the most part although he always felt it was somewhat unstable. In 2016 he hit the same area with the door of his car had this reopened. He tells me that this is never really closed although sometimes an inflow it remains open on a constant basis. He has not been using any specific dressing to this except for topical antibiotics the nature of which were not really sure. His primary doctor did send him to see Dr. Jacinto Halim of interventional cardiology. He underwent an angiogram on 08/06/17 and he underwent a PTA and  directional atherectomy of the lesser distal SFA and popliteal arteries which resulted in brisk improvement in blood flow. It was noted that he had 2 vessel runoff through the anterior tibial and peroneal. He is also been to see vascular and interventional radiologist. He was not felt to have any significant superficial venous insufficiency. Presumably is not a candidate for any ablation. It was suggested he come here for wound care. The patient is a type II diabetic on insulin. He also has a history of venous insufficiency. ABIs on the left were noncompressible in our clinic 10/21/17; patient we admitted to the clinic last week. 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 We put Iodosorb on him after an aggressive debridement and 3 layer compression. He complained of pain in his ankle and itching with is skin in fact he scratched the area on the medial calf superiorly at the rim of our wraps and he has 2 small open areas in that location today which are new. I changed his primary dressing today to silver collagen. As noted he is already had revascularization and does not have any significant superficial venous insufficiency that would be amenable to ablation 10/28/17; patient admitted to the clinic 2 weeks ago. He has a smaller Wound. Scratch injury from last week revealed. There is large wound over the tibial area. This is smaller. Granulation looks healthy. No need for debridement. 11/04/17; the wound on the left lateral calf looks better. Improved dimensions. Surface of this looks better. We've been maintaining him and Kerlix Coban wraps. He finds this much more comfortable. Silver collagen dressing 11/11/17; left lateral Wound continues to look healthy be making progress. Using a #5 curet I removed removed nonviable skin from the surface of the wound and then necrotic debris from the wound surface. Surface of the wound continues to look  healthy. He also has an open area on the left great toenail bed. We've been using topical antibiotics. 11/19/17; left anterior lateral wound continues to look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 Shelter Needs: No; Support System Lacking: No; Transportation Concerns: No Electronic Signature(s) Signed: 04/11/2023 11:04:27 AM By: [name] MD FACS Entered By: [name] on 04/11/2023 07:16:43 -------------------------------------------------------------------------------- SuperBill Details Patient Name: Date of Service: [name]. 04/11/2023 Medical Record Number: [id number] Patient  Account Number: [id number] TAUNO, FALOTICO ([id number]) [id number] Page 22 of 22 Date of Birth/Sex: Treating RN: 10-02-1950 (72 y.o. 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 L85.9 Epidermal thickening, unspecified E11.51 Type 2 diabetes mellitus with diabetic peripheral angiopathy without gangrene I89.0 Lymphedema, not elsewhere classified Facility Procedures : CPT4 Code: [id number] Description: 97597 - DEBRIDE WOUND 1ST 20 SQ CM OR < ICD-10 Diagnosis Description L97.528 Non-pressure chronic ulcer of other part of left foot with other specified severi 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 Modifier: ty Quantity: 1 Physician Procedures : CPT4 Code Description Modifier [id number] - WC PHYS LEVEL 3 - EST PT 25 ICD-10 Diagnosis Description 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 Prim Dressing: Maxorb Extra Ag+ Alginate Dressing, 2x2 (in/in) 1 x Per Week/30 Days ary Discharge Instructions: Apply to wound bed as instructed Secondary Dressing: Optifoam Non-Adhesive Dressing, 4x4 in (Generic) 1 x Per Week/30 Days Discharge Instructions: Apply over primary dressing as directed. Secondary Dressing: Woven Gauze Sponges 2x2 in (Generic) 1 x Per Week/30 Days Discharge Instructions: Apply over primary dressing as directed. Secured With: 16M Medipore H Soft Cloth Surgical T ape, 4 x 10 (in/yd) 1 x Per Week/30 Days Discharge Instructions: Secure with tape as directed. Wound #22R - Lower Leg Wound Laterality: Left, Lateral, Proximal Cleanser: Soap and Water 1 x Per Day/30 Days Discharge Instructions: May shower and wash wound with dial antibacterial soap and water prior to dressing change. Cleanser: Wound Cleanser 1 x Per Day/30 Days Discharge Instructions: Cleanse the wound with wound cleanser prior to applying a clean dressing using gauze sponges, not tissue or cotton balls. Topical: Skintegrity Hydrogel 4 (oz) 1 x Per Day/30 Days Discharge Instructions: Apply hydrogel as directed Prim Dressing: Endoform 2x2 in 1 x Per Day/30 Days ary Discharge Instructions: Moisten with saline Secondary Dressing: Woven Gauze Sponge, Non-Sterile 4x4 in 1 x Per Day/30 Days Discharge  Instructions: Apply over primary dressing as directed. Secured With: Elastic Bandage 4 inch (ACE bandage) 1 x Per Day/30 Days Discharge Instructions: Secure with ACE bandage as directed. Secured With: [employer], 4.5x3.1 (in/yd) 1 x Per Day/30 Days Discharge Instructions: Secure with Kerlix as directed. Patient Medications llergies: No Known Drug Allergies A Notifications Medication Indication Start End 04/11/2023 lidocaine DOSE topical 4 % cream - cream topical Electronic Signature(s) Signed: 04/11/2023 11:04:27 AM By: [name] MD FACS Entered By: [name] on 04/11/2023 07:17:30 -------------------------------------------------------------------------------- Problem List Details Patient Name: Date of Service: [name]. 04/11/2023 10:00 A M Medical Record Number: [id number] Patient Account Number: [id number] Date of Birth/Sex: Treating RN: 01/02/1951 (72 y.o. 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 03/28/2023 No Yes I87.322 Chronic venous hypertension (idiopathic) with inflammation of left lower 04/12/2021 No Yes extremity L85.9 Epidermal thickening, unspecified 08/30/2022 No Yes E11.51 Type 2 diabetes mellitus with diabetic peripheral angiopathy without gangrene 04/12/2021 No Yes I89.0 Lymphedema, not elsewhere classified 04/12/2021 No Yes Inactive Problems ICD-10 Code Description Active Date Inactive Date E11.621 Type 2 diabetes mellitus with foot ulcer 04/12/2021 04/12/2021 E11.42 Type 2  diabetes mellitus with diabetic polyneuropathy 04/12/2021 04/12/2021 L02.416 Cutaneous abscess of left lower limb 06/13/2021 06/13/2021 Resolved Problems ICD-10 Code Description Active Date Resolved Date L97.828 Non-pressure chronic ulcer of other part of left lower leg with other specified severity 04/12/2021 04/12/2021 N82.956 Non-pressure chronic ulcer of other part of left lower leg with other specified severity 01/10/2023 06/08/2022 Electronic Signature(s) Signed: 04/11/2023 10:12:55 AM By: [name] MD FACS Entered By: [name] on 04/11/2023 07:12:54 -------------------------------------------------------------------------------- Progress Note Details Patient Name: Date of Service: [name]. 04/11/2023 10:00 A M Medical Record Number: [id number] Patient Account Number: [id number] Date of Birth/Sex: Treating RN: 02-12-1951 (72 y.o. M) Primary Care  dry skin pulled off when he was changing his dressing. It is quite dry and he states that he is using just water to moisten his endoform, rather than the hydrogel that was recommended. The foot wound is the same size, but the tissue is in better condition, without substantial bruising or friction-related trauma. 04/11/2023: Both wounds look better this week. Much of the lateral leg wound has epithelialized. The moisture balance is much better. The foot wound looks better than it has in months. The granulation tissue is flush with the surrounding skin and there is not much callus accumulation. Electronic Signature(s) Signed: 04/11/2023 10:16:35 AM By: [name] MD FACS Entered By: [name] on 04/11/2023 07:16:35 -------------------------------------------------------------------------------- Physical Exam Details Patient Name: Date of Service: [name]. 04/11/2023 10:00 A M Medical Record Number: [id number] Patient Account Number: [id number] [name], [name] ([id number]) [id number] Page 9 of 22 Date of Birth/Sex: Treating RN: 12-23-1950 (72 y.o. M) Primary Care Provider: Other Clinician: [name] Referring Provider: Treating Provider/Extender: [name] in Treatment: 104 Constitutional . Slightly bradycardic. . . no acute distress. Respiratory Normal work of breathing on room air. Notes 04/11/2023: Both wounds look better this week. Much of the lateral leg wound has epithelialized. The moisture balance is much better. The foot wound looks better than it has in months. The granulation tissue is flush with the surrounding skin and there is not much callus accumulation. Electronic Signature(s) Signed: 04/11/2023 10:17:12 AM By: [name]  MD FACS Entered By: [name] on 04/11/2023 07:17:11 -------------------------------------------------------------------------------- Physician Orders Details Patient Name: Date of Service: [name]. 04/11/2023 10:00 A M Medical Record Number: [id number] Patient Account Number: [id number] Date of Birth/Sex: Treating RN: [birth year] (72 y.o. 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 - Dr Lady Gary - Room 2 Return A Anesthetic (In clinic) Topical Lidocaine 4% applied to wound bed Bathing/ Shower/ Hygiene May shower and wash wound with soap and water. Edema Control - Lymphedema / SCD / Other Elevate legs to the level of the heart or above for 30 minutes daily and/or when sitting for 3-4 times a day throughout the day. Avoid standing for long periods of time. Patient to wear own compression stockings every day. - both legs daily Moisturize legs daily. Compression stocking or Garment 20-30 mm/Hg pressure to: Off-Loading Wound #18R Left,Plantar Metatarsal head first Open toe surgical shoe to: - Front off loader Left ft Other: - minimal weight bearing left foot Additional Orders /  Instructions Follow Nutritious Diet - vitamin C 500 mg 3 times a day and zinc 30-50 mg a day [name], [name] ([id number]) [id number] Page 10 of 22 Wound Treatment Wound #18R - Metatarsal head first Wound Laterality: Plantar, Left Cleanser: Soap and Water 1 x Per Week/30 Days Discharge Instructions: May shower and wash wound with dial antibacterial soap and water prior to dressing change. Cleanser: Wound Cleanser 1  throughout the day. Avoid standing for long periods of time. Patient to wear own compression stockings every day. - both legs daily Moisturize legs daily. Compression stocking or Garment 20-30 mm/Hg pressure to: Off-Loading: Wound #18R Left,Plantar Metatarsal head first: Open toe surgical shoe to: - Front off loader Left ft Other: - minimal weight bearing left foot Additional Orders / Instructions: Follow Nutritious Diet - vitamin C 500 mg 3 times a day and zinc 30-50 mg a day The following medication(s) was prescribed: lidocaine topical 4 % cream cream topical was prescribed at facility WOUND #18R: - Metatarsal head first Wound Laterality: Plantar, Left Cleanser: Soap and Water 1 x Per Week/30 Days Discharge Instructions: May shower and wash wound with dial antibacterial soap and water prior to dressing change. Cleanser: Wound Cleanser 1 x Per Week/30 Days Discharge Instructions: Cleanse the wound with wound cleanser prior to applying a clean dressing using gauze sponges, not tissue or cotton balls. Prim Dressing: Maxorb Extra Ag+ Alginate Dressing, 2x2 (in/in) 1 x Per Week/30 Days ary Discharge Instructions: Apply to wound bed as instructed Secondary Dressing: Optifoam Non-Adhesive Dressing, 4x4 in (Generic) 1 x Per Week/30  Days Discharge Instructions: Apply over primary dressing as directed. Secondary Dressing: Woven Gauze Sponges 2x2 in (Generic) 1 x Per Week/30 Days Discharge Instructions: Apply over primary dressing as directed. Secured With: 64M Medipore H Soft Cloth Surgical T ape, 4 x 10 (in/yd) 1 x Per Week/30 Days Discharge Instructions: Secure with tape as directed. WOUND #22R: - Lower Leg Wound Laterality: Left, Lateral, Proximal Cleanser: Soap and Water 1 x Per Day/30 Days Discharge Instructions: May shower and wash wound with dial antibacterial soap and water prior to dressing change. Cleanser: Wound Cleanser 1 x Per Day/30 Days Discharge Instructions: Cleanse the wound with wound cleanser prior to applying a clean dressing using gauze sponges, not tissue or cotton balls. Topical: Skintegrity Hydrogel 4 (oz) 1 x Per Day/30 Days Discharge Instructions: Apply hydrogel as directed Prim Dressing: Endoform 2x2 in 1 x Per Day/30 Days ary Discharge Instructions: Moisten with saline Secondary Dressing: Woven Gauze Sponge, Non-Sterile 4x4 in 1 x Per Day/30 Days Discharge Instructions: Apply over primary dressing as directed. Secured With: Elastic Bandage 4 inch (ACE bandage) 1 x Per Day/30 Days Discharge Instructions: Secure with ACE bandage as directed. Secured With: [employer], 4.5x3.1 (in/yd) 1 x Per Day/30 Days Discharge Instructions: Secure with Kerlix as directed. 04/11/2023: Both wounds look better this week. Much of the lateral leg wound has epithelialized. The moisture balance is much better. The foot wound looks better than it has in months. The granulation tissue is flush with the surrounding skin and there is not much callus accumulation. GRAVIEL, PAYEUR ([id number]) [id number] Page 20 of 22 I used a curette to debride slough and eschar from the lateral leg wound. We will continue endoform with hydrogel, gauze, and an Ace bandage here. I debrided callus and  slough from the plantar foot wound. Continue silver alginate here. He is wearing his own compression stockings. Follow-up in 1 week. Electronic Signature(s) Signed: 04/11/2023 10:18:30 AM By: [name] MD FACS Entered By: [name] on 04/11/2023 07:18:30 -------------------------------------------------------------------------------- HxROS Details Patient Name: Date of Service: [name]. 04/11/2023 10:00 A M Medical Record Number: [id number] Patient Account Number: [id number] Date of Birth/Sex: Treating RN: 1951/05/12 (72 y.o. M) Primary Care Provider: [name] Other Clinician: Referring Provider: Treating Provider/Extender: [name] in Treatment: 104 Information Obtained From Patient Eyes Medical History: Positive for:  Number: [id number] Patient Account Number: [id number] Date of Birth/Sex: Treating RN: 1951/06/18 (72 y.o. M) Primary Care Provider: [name] Other Clinician: Referring Provider: Treating Provider/Extender: [name] in Treatment: 104 History of Present Illness HPI Description: 10/11/17; Mr. Mccambridge is a 72 year old man who tells me that in 2015 he slipped down the latter traumatizing his left leg. He developed a wound in the same spot the area that we are currently looking at. He states this closed over for the most part although he always felt it was somewhat unstable. In 2016 he hit the same area with the door of his car had this reopened. He tells me that this is never really closed although sometimes an inflow it remains open on a constant basis. He has not been using any specific dressing to this except for topical antibiotics the nature of which were not really sure. His primary doctor did send him to see Dr. Jacinto Halim of interventional cardiology. He underwent an angiogram on 08/06/17 and he underwent a PTA and  directional atherectomy of the lesser distal SFA and popliteal arteries which resulted in brisk improvement in blood flow. It was noted that he had 2 vessel runoff through the anterior tibial and peroneal. He is also been to see vascular and interventional radiologist. He was not felt to have any significant superficial venous insufficiency. Presumably is not a candidate for any ablation. It was suggested he come here for wound care. The patient is a type II diabetic on insulin. He also has a history of venous insufficiency. ABIs on the left were noncompressible in our clinic 10/21/17; patient we admitted to the clinic last week. 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 We put Iodosorb on him after an aggressive debridement and 3 layer compression. He complained of pain in his ankle and itching with is skin in fact he scratched the area on the medial calf superiorly at the rim of our wraps and he has 2 small open areas in that location today which are new. I changed his primary dressing today to silver collagen. As noted he is already had revascularization and does not have any significant superficial venous insufficiency that would be amenable to ablation 10/28/17; patient admitted to the clinic 2 weeks ago. He has a smaller Wound. Scratch injury from last week revealed. There is large wound over the tibial area. This is smaller. Granulation looks healthy. No need for debridement. 11/04/17; the wound on the left lateral calf looks better. Improved dimensions. Surface of this looks better. We've been maintaining him and Kerlix Coban wraps. He finds this much more comfortable. Silver collagen dressing 11/11/17; left lateral Wound continues to look healthy be making progress. Using a #5 curet I removed removed nonviable skin from the surface of the wound and then necrotic debris from the wound surface. Surface of the wound continues to look  healthy. He also has an open area on the left great toenail bed. We've been using topical antibiotics. 11/19/17; left anterior lateral wound continues to look healthy but it's not closed. He also had a small wound above this on the left leg Initially traumatic wounds in the setting of significant chronic venous insufficiency and stasis dermatitis 11/25/17; left anterior wounds superiorly is closed still a small wound inferiorly. 12/02/17; left anterior tibial area. Arrives today with adherent callus. Post debridement clearly not completely closed. Hydrofera Blue under 3 layer compression. 12/09/17; left anterior tibia.  [name], [name] ([id number]) [id number] Page 1 of 22 Visit Report for 04/11/2023 Chief Complaint Document Details Patient Name: Date of Service: [name], [name] 04/11/2023 10:00 A M Medical Record Number: [id number] Patient Account Number: [id number] Date of Birth/Sex: Treating RN: 08-28-1950 (72 y.o. M) Primary Care Provider: [name] Other Clinician: Referring Provider: Treating Provider/Extender: [name] in Treatment: 104 Information Obtained from: Patient Chief Complaint Left leg and foot ulcers 04/12/2021; patient is here for wounds on his left lower leg and left plantar foot over the first metatarsal head Electronic Signature(s) Signed: 04/11/2023 10:13:13 AM By: [name] MD FACS Entered By: [name] on 04/11/2023 07:13:13 -------------------------------------------------------------------------------- Debridement Details Patient Name: Date of Service: [name]. 04/11/2023 10:00 A M Medical Record Number: [id number] Patient Account Number: [id number] Date of Birth/Sex: Treating RN: [birth year] (72 y.o. [name] Primary Care Provider: [name] Other Clinician: Referring Provider: Treating Provider/Extender: [name] in Treatment: 104 Debridement Performed for Assessment: Wound #22R Left,Proximal,Lateral Lower Leg Performed By: Physician [name], MD The following information was scribed by: [name] The information was scribed for: [name] Debridement Type: Debridement Level of Consciousness (Pre-procedure): Awake and Alert Pre-procedure Verification/Time Out Yes - 10:01 Taken: Start Time: 10:01 Pain Control: Lidocaine 4% Topical Solution Percent of Wound Bed Debrided: 100% T Area Debrided (cm): otal 0.66 Tissue and other material debrided: Non-Viable, Eschar, Slough, Slough Level: Non-Viable Tissue Debridement  Description: Selective/Open Wound Instrument: Curette Bleeding: Minimum Hemostasis Achieved: Pressure Response to Treatment: Procedure was tolerated well Level of Consciousness (Post- Awake and Alert procedure): Post Debridement Measurements of Total Wound Length: (cm) 1.4 Width: (cm) 0.6 Depth: (cm) 0.1 Volume: (cm) 0.066 Character of Wound/Ulcer Post Debridement: Improved [name] ([id number]) [id number] Page 2 of 22 Post Procedure Diagnosis Same as Pre-procedure Electronic Signature(s) Signed: 04/11/2023 11:04:27 AM By: [name] MD FACS Signed: 04/11/2023 3:56:28 PM By: [name] Entered By: [name] on 04/11/2023 07:01:29 -------------------------------------------------------------------------------- Debridement Details Patient Name: Date of Service: [name]. 04/11/2023 10:00 A M Medical Record Number: [id number] Patient Account Number: [id number] Date of Birth/Sex: Treating RN: [birth year] (72 y.o. [name] Primary Care Provider: [name] Other Clinician: Referring Provider: Treating Provider/Extender: [name] in Treatment: 104 Debridement Performed for Assessment: Wound #18R Left,Plantar Metatarsal head first Performed By: Physician [name], MD The following information was scribed by: [name] The information was scribed for: [name] Debridement Type: Debridement Severity of Tissue Pre Debridement: Fat layer exposed Level of Consciousness (Pre-procedure): Awake and Alert Pre-procedure Verification/Time Out Yes - 10:01 Taken: Start Time: 10:01 Pain Control: Lidocaine 4% Topical Solution Percent of Wound Bed Debrided: 100% T Area Debrided (cm): otal 2.2 Tissue and other material debrided: Non-Viable, Callus, Slough, Slough Level: Non-Viable Tissue Debridement Description: Selective/Open Wound Instrument: Curette Bleeding:  Minimum Hemostasis Achieved: Pressure Response to Treatment: Procedure was tolerated well Level of Consciousness (Post- Awake and Alert procedure): Post Debridement Measurements of Total Wound Length: (cm) 1.4 Width: (cm) 2 Depth: (cm) 0.2 Volume: (cm) 0.44 Character of Wound/Ulcer Post Debridement: Improved Severity of Tissue Post Debridement: Fat layer exposed Post Procedure Diagnosis Same as Pre-procedure Electronic Signature(s) Signed: 04/11/2023 11:04:27 AM By: [name] MD FACS Signed: 04/11/2023 3:56:28 PM By: [name] Entered By: [name] on 04/11/2023 07:03:23 HPI Details -------------------------------------------------------------------------------- [name] ([id number]) [id number] Page 3 of 22 Patient Name: Date of Service: [name], [name]. 04/11/2023 10:00 A M Medical Record  dry skin pulled off when he was changing his dressing. It is quite dry and he states that he is using just water to moisten his endoform, rather than the hydrogel that was recommended. The foot wound is the same size, but the tissue is in better condition, without substantial bruising or friction-related trauma. 04/11/2023: Both wounds look better this week. Much of the lateral leg wound has epithelialized. The moisture balance is much better. The foot wound looks better than it has in months. The granulation tissue is flush with the surrounding skin and there is not much callus accumulation. Electronic Signature(s) Signed: 04/11/2023 10:16:35 AM By: [name] MD FACS Entered By: [name] on 04/11/2023 07:16:35 -------------------------------------------------------------------------------- Physical Exam Details Patient Name: Date of Service: [name]. 04/11/2023 10:00 A M Medical Record Number: [id number] Patient Account Number: [id number] [name], [name] ([id number]) [id number] Page 9 of 22 Date of Birth/Sex: Treating RN: 12-23-1950 (72 y.o. M) Primary Care Provider: Other Clinician: [name] Referring Provider: Treating Provider/Extender: [name] in Treatment: 104 Constitutional . Slightly bradycardic. . . no acute distress. Respiratory Normal work of breathing on room air. Notes 04/11/2023: Both wounds look better this week. Much of the lateral leg wound has epithelialized. The moisture balance is much better. The foot wound looks better than it has in months. The granulation tissue is flush with the surrounding skin and there is not much callus accumulation.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

## 2023-04-11 NOTE — Progress Notes (Signed)
Discharge Instruction: Moisten with saline Secondary Dressing Woven Gauze Sponge, Non-Sterile 4x4 in Discharge Instruction: Apply over primary dressing as directed. Secured With Elastic Bandage 4 inch (ACE bandage) Discharge Instruction: Secure with ACE bandage as directed. Kerlix Roll Sterile, 4.5x3.1 (in/yd) Discharge Instruction: Secure with Kerlix as directed. Compression Wrap Compression Stockings Add-Ons Electronic Signature(s) Signed: 04/11/2023 3:56:28 PM By: [name] Entered By: [name] on 04/11/2023 09:56:55 -------------------------------------------------------------------------------- Vitals Details Patient Name: Date of Service: [name].  04/11/2023 10:00 A M Medical Record Number: [id number] Patient Account Number: [id number] Date of Birth/Sex: Treating RN: [birth year] (72 y.o. [name] Primary Care Juliani Laduke: [name] Other Clinician: Referring Nollie Shiflett: Treating Hoy Fallert/Extender: [name] in Treatment: [address], [place]ola Factor ([id number]) [id number] Page 10 of 10 Time Taken: 09:52 Temperature (F): 98.0 Height (in): 74 Pulse (bpm): 57 Weight (lbs): 238 Respiratory Rate (breaths/min): 18 Body Mass Index (BMI): 30.6 Blood Pressure (mmHg): 119/62 Capillary Blood Glucose (mg/dl): 782 Reference Range: 80 - 120 mg / dl Electronic Signature(s) Signed: 04/11/2023 3:56:28 PM By: [name] Entered By: [name] on 04/11/2023 09:52:54  of Service: DAMONTRE, MILLEA 04/11/2023 10:00 A M Medical Record Number: [id number] Patient Account Number: [id number] Date of Birth/Sex: Treating RN: 1950/07/01 (72 y.o. [name] Primary Care Daisy Lites: [name] Other Clinician: Referring Marka Treloar: Treating Chameka Mcmullen/Extender: [name] in Treatment: [phone] Multidisciplinary Care Plan reviewed with physician [address] MORDECAI, TINDOL ([id number])  [id number] Page 5 of 10 Venous Leg Ulcer Nursing Diagnoses: Knowledge deficit related to disease process and management Potential for venous Insuffiency (use before diagnosis confirmed) Goals: Patient will maintain optimal edema control Date Initiated: 07/27/2021 Target Resolution Date: 04/26/2023 Goal Status: Active Interventions: Assess peripheral edema status every visit. Treatment Activities: Therapeutic compression applied : 07/27/2021 Notes: Wound/Skin Impairment Nursing Diagnoses: Impaired tissue integrity Knowledge deficit related to ulceration/compromised skin integrity Goals: Patient will have a decrease in wound volume by X% from date: (specify in notes) Date Initiated: 04/12/2021 Date Inactivated: 01/04/2022 Target Resolution Date: 04/23/2021 Goal Status: Met Patient/caregiver will verbalize understanding of skin care regimen Date Initiated: 01/04/2022 Target Resolution Date: 04/26/2023 Goal Status: Active Ulcer/skin breakdown will have a volume reduction of 30% by week 4 Date Initiated: 04/12/2021 Date Inactivated: 04/27/2021 Target Resolution Date: 04/27/2021 Goal Status: Unmet Unmet Reason: infection Ulcer/skin breakdown will have a volume reduction of 50% by week 8 Date Initiated: 04/27/2021 Date Inactivated: 06/29/2021 Target Resolution Date: 06/24/2021 Goal Status: Met Interventions: Assess patient/caregiver ability to obtain necessary supplies Assess patient/caregiver ability to perform ulcer/skin care regimen upon admission and as needed Assess ulceration(s) every visit Notes: Electronic Signature(s) Signed: 04/11/2023 3:56:28 PM By: [name] Entered By: [name] on 04/11/2023 09:46:27 -------------------------------------------------------------------------------- Pain Assessment Details Patient Name: Date of Service: [name]. 04/11/2023 10:00 A M Medical Record Number: [id number] Patient Account  Number: [id number] Date of Birth/Sex: Treating RN: 1951-02-04 (72 y.o. [name] Primary Care Dontavis Tschantz: [name] Other Clinician: Referring Meloney Feld: Treating Leeandra Ellerson/Extender: [name] in Treatment: [phone] Active Problems Location of Pain Severity and Description of Pain Patient Has Paino No Site Locations Rate the pain. KENZO, OZMENT ([id number]) [id number] Page 6 of 10 Rate the pain. Current Pain Level: 0 Pain Management and Medication Current Pain Management: Electronic Signature(s) Signed: 04/11/2023 3:56:28 PM By: [name] Entered By: [name] on 04/11/2023 09:47:24 -------------------------------------------------------------------------------- Patient/Caregiver Education Details Patient Name: Date of Service: [name] 10/17/2024andnbsp10:00 A M Medical Record Number: [id number] Patient Account Number: [id number] Date of Birth/Gender: Treating RN: [birth year] (72 y.o. [name] Primary Care Physician: [name] Other Clinician: Referring Physician: Treating Physician/Extender: [name] in Treatment: [phone] Education Assessment Education Provided To: Patient Education Topics Provided Wound/Skin Impairment: Methods: Explain/Verbal Responses: Reinforcements needed, State content correctly Electronic Signature(s) Signed: 04/11/2023 3:56:28 PM By: [name] Entered By: [name] on 04/11/2023 09:46:42 -------------------------------------------------------------------------------- Wound Assessment Details Patient Name: Date of Service: [name]. 04/11/2023 10:00 A M Medical Record Number: [id number] Patient Account Number: [id number] Date of Birth/Sex: Treating RN: [birth year] (72 y.o. [name] Primary Care Cordelro Gautreau: [name] Other Clinician: Referring Annaston Upham: Treating Wilberto Console/Extender: Juluis, Fitzsimmons ([id number]) [id number] Page 7 of 10 Weeks in Treatment: 104 Wound Status Wound Number: [id number] Primary Diabetic Wound/Ulcer of the Lower Extremity Etiology: Wound Location: Left, Plantar Metatarsal head first Wound Open Wounding Event: Gradually Appeared Status: Date Acquired: 08/23/2020 Comorbid Glaucoma, Sleep Apnea, Hypertension, Peripheral Arterial Disease, Weeks Of Treatment: 104 History: Peripheral Venous Disease, Type II Diabetes, Gout, Osteoarthritis, Clustered Wound: No Neuropathy Photos Wound  of Service: DAMONTRE, MILLEA 04/11/2023 10:00 A M Medical Record Number: [id number] Patient Account Number: [id number] Date of Birth/Sex: Treating RN: 1950/07/01 (72 y.o. [name] Primary Care Daisy Lites: [name] Other Clinician: Referring Marka Treloar: Treating Chameka Mcmullen/Extender: [name] in Treatment: [phone] Multidisciplinary Care Plan reviewed with physician [address] MORDECAI, TINDOL ([id number])  [id number] Page 5 of 10 Venous Leg Ulcer Nursing Diagnoses: Knowledge deficit related to disease process and management Potential for venous Insuffiency (use before diagnosis confirmed) Goals: Patient will maintain optimal edema control Date Initiated: 07/27/2021 Target Resolution Date: 04/26/2023 Goal Status: Active Interventions: Assess peripheral edema status every visit. Treatment Activities: Therapeutic compression applied : 07/27/2021 Notes: Wound/Skin Impairment Nursing Diagnoses: Impaired tissue integrity Knowledge deficit related to ulceration/compromised skin integrity Goals: Patient will have a decrease in wound volume by X% from date: (specify in notes) Date Initiated: 04/12/2021 Date Inactivated: 01/04/2022 Target Resolution Date: 04/23/2021 Goal Status: Met Patient/caregiver will verbalize understanding of skin care regimen Date Initiated: 01/04/2022 Target Resolution Date: 04/26/2023 Goal Status: Active Ulcer/skin breakdown will have a volume reduction of 30% by week 4 Date Initiated: 04/12/2021 Date Inactivated: 04/27/2021 Target Resolution Date: 04/27/2021 Goal Status: Unmet Unmet Reason: infection Ulcer/skin breakdown will have a volume reduction of 50% by week 8 Date Initiated: 04/27/2021 Date Inactivated: 06/29/2021 Target Resolution Date: 06/24/2021 Goal Status: Met Interventions: Assess patient/caregiver ability to obtain necessary supplies Assess patient/caregiver ability to perform ulcer/skin care regimen upon admission and as needed Assess ulceration(s) every visit Notes: Electronic Signature(s) Signed: 04/11/2023 3:56:28 PM By: [name] Entered By: [name] on 04/11/2023 09:46:27 -------------------------------------------------------------------------------- Pain Assessment Details Patient Name: Date of Service: [name]. 04/11/2023 10:00 A M Medical Record Number: [id number] Patient Account  Number: [id number] Date of Birth/Sex: Treating RN: 1951-02-04 (72 y.o. [name] Primary Care Dontavis Tschantz: [name] Other Clinician: Referring Meloney Feld: Treating Leeandra Ellerson/Extender: [name] in Treatment: [phone] Active Problems Location of Pain Severity and Description of Pain Patient Has Paino No Site Locations Rate the pain. KENZO, OZMENT ([id number]) [id number] Page 6 of 10 Rate the pain. Current Pain Level: 0 Pain Management and Medication Current Pain Management: Electronic Signature(s) Signed: 04/11/2023 3:56:28 PM By: [name] Entered By: [name] on 04/11/2023 09:47:24 -------------------------------------------------------------------------------- Patient/Caregiver Education Details Patient Name: Date of Service: [name] 10/17/2024andnbsp10:00 A M Medical Record Number: [id number] Patient Account Number: [id number] Date of Birth/Gender: Treating RN: [birth year] (72 y.o. [name] Primary Care Physician: [name] Other Clinician: Referring Physician: Treating Physician/Extender: [name] in Treatment: [phone] Education Assessment Education Provided To: Patient Education Topics Provided Wound/Skin Impairment: Methods: Explain/Verbal Responses: Reinforcements needed, State content correctly Electronic Signature(s) Signed: 04/11/2023 3:56:28 PM By: [name] Entered By: [name] on 04/11/2023 09:46:42 -------------------------------------------------------------------------------- Wound Assessment Details Patient Name: Date of Service: [name]. 04/11/2023 10:00 A M Medical Record Number: [id number] Patient Account Number: [id number] Date of Birth/Sex: Treating RN: [birth year] (72 y.o. [name] Primary Care Cordelro Gautreau: [name] Other Clinician: Referring Annaston Upham: Treating Wilberto Console/Extender: Juluis, Fitzsimmons ([id number]) [id number] Page 7 of 10 Weeks in Treatment: 104 Wound Status Wound Number: [id number] Primary Diabetic Wound/Ulcer of the Lower Extremity Etiology: Wound Location: Left, Plantar Metatarsal head first Wound Open Wounding Event: Gradually Appeared Status: Date Acquired: 08/23/2020 Comorbid Glaucoma, Sleep Apnea, Hypertension, Peripheral Arterial Disease, Weeks Of Treatment: 104 History: Peripheral Venous Disease, Type II Diabetes, Gout, Osteoarthritis, Clustered Wound: No Neuropathy Photos Wound  of Service: DAMONTRE, MILLEA 04/11/2023 10:00 A M Medical Record Number: [id number] Patient Account Number: [id number] Date of Birth/Sex: Treating RN: 1950/07/01 (72 y.o. [name] Primary Care Daisy Lites: [name] Other Clinician: Referring Marka Treloar: Treating Chameka Mcmullen/Extender: [name] in Treatment: [phone] Multidisciplinary Care Plan reviewed with physician [address] MORDECAI, TINDOL ([id number])  [id number] Page 5 of 10 Venous Leg Ulcer Nursing Diagnoses: Knowledge deficit related to disease process and management Potential for venous Insuffiency (use before diagnosis confirmed) Goals: Patient will maintain optimal edema control Date Initiated: 07/27/2021 Target Resolution Date: 04/26/2023 Goal Status: Active Interventions: Assess peripheral edema status every visit. Treatment Activities: Therapeutic compression applied : 07/27/2021 Notes: Wound/Skin Impairment Nursing Diagnoses: Impaired tissue integrity Knowledge deficit related to ulceration/compromised skin integrity Goals: Patient will have a decrease in wound volume by X% from date: (specify in notes) Date Initiated: 04/12/2021 Date Inactivated: 01/04/2022 Target Resolution Date: 04/23/2021 Goal Status: Met Patient/caregiver will verbalize understanding of skin care regimen Date Initiated: 01/04/2022 Target Resolution Date: 04/26/2023 Goal Status: Active Ulcer/skin breakdown will have a volume reduction of 30% by week 4 Date Initiated: 04/12/2021 Date Inactivated: 04/27/2021 Target Resolution Date: 04/27/2021 Goal Status: Unmet Unmet Reason: infection Ulcer/skin breakdown will have a volume reduction of 50% by week 8 Date Initiated: 04/27/2021 Date Inactivated: 06/29/2021 Target Resolution Date: 06/24/2021 Goal Status: Met Interventions: Assess patient/caregiver ability to obtain necessary supplies Assess patient/caregiver ability to perform ulcer/skin care regimen upon admission and as needed Assess ulceration(s) every visit Notes: Electronic Signature(s) Signed: 04/11/2023 3:56:28 PM By: [name] Entered By: [name] on 04/11/2023 09:46:27 -------------------------------------------------------------------------------- Pain Assessment Details Patient Name: Date of Service: [name]. 04/11/2023 10:00 A M Medical Record Number: [id number] Patient Account  Number: [id number] Date of Birth/Sex: Treating RN: 1951-02-04 (72 y.o. [name] Primary Care Dontavis Tschantz: [name] Other Clinician: Referring Meloney Feld: Treating Leeandra Ellerson/Extender: [name] in Treatment: [phone] Active Problems Location of Pain Severity and Description of Pain Patient Has Paino No Site Locations Rate the pain. KENZO, OZMENT ([id number]) [id number] Page 6 of 10 Rate the pain. Current Pain Level: 0 Pain Management and Medication Current Pain Management: Electronic Signature(s) Signed: 04/11/2023 3:56:28 PM By: [name] Entered By: [name] on 04/11/2023 09:47:24 -------------------------------------------------------------------------------- Patient/Caregiver Education Details Patient Name: Date of Service: [name] 10/17/2024andnbsp10:00 A M Medical Record Number: [id number] Patient Account Number: [id number] Date of Birth/Gender: Treating RN: [birth year] (72 y.o. [name] Primary Care Physician: [name] Other Clinician: Referring Physician: Treating Physician/Extender: [name] in Treatment: [phone] Education Assessment Education Provided To: Patient Education Topics Provided Wound/Skin Impairment: Methods: Explain/Verbal Responses: Reinforcements needed, State content correctly Electronic Signature(s) Signed: 04/11/2023 3:56:28 PM By: [name] Entered By: [name] on 04/11/2023 09:46:42 -------------------------------------------------------------------------------- Wound Assessment Details Patient Name: Date of Service: [name]. 04/11/2023 10:00 A M Medical Record Number: [id number] Patient Account Number: [id number] Date of Birth/Sex: Treating RN: [birth year] (72 y.o. [name] Primary Care Cordelro Gautreau: [name] Other Clinician: Referring Annaston Upham: Treating Wilberto Console/Extender: Juluis, Fitzsimmons ([id number]) [id number] Page 7 of 10 Weeks in Treatment: 104 Wound Status Wound Number: [id number] Primary Diabetic Wound/Ulcer of the Lower Extremity Etiology: Wound Location: Left, Plantar Metatarsal head first Wound Open Wounding Event: Gradually Appeared Status: Date Acquired: 08/23/2020 Comorbid Glaucoma, Sleep Apnea, Hypertension, Peripheral Arterial Disease, Weeks Of Treatment: 104 History: Peripheral Venous Disease, Type II Diabetes, Gout, Osteoarthritis, Clustered Wound: No Neuropathy Photos Wound  of Service: DAMONTRE, MILLEA 04/11/2023 10:00 A M Medical Record Number: [id number] Patient Account Number: [id number] Date of Birth/Sex: Treating RN: 1950/07/01 (72 y.o. [name] Primary Care Daisy Lites: [name] Other Clinician: Referring Marka Treloar: Treating Chameka Mcmullen/Extender: [name] in Treatment: [phone] Multidisciplinary Care Plan reviewed with physician [address] MORDECAI, TINDOL ([id number])  [id number].pdf Page 5 of 10 Venous Leg Ulcer Nursing Diagnoses: Knowledge deficit related to disease process and management Potential for venous Insuffiency (use before diagnosis confirmed) Goals: Patient will maintain optimal edema control Date Initiated: 07/27/2021 Target Resolution Date: 04/26/2023 Goal Status: Active Interventions: Assess peripheral edema status every visit. Treatment Activities: Therapeutic compression applied : 07/27/2021 Notes: Wound/Skin Impairment Nursing Diagnoses: Impaired tissue integrity Knowledge deficit related to ulceration/compromised skin integrity Goals: Patient will have a decrease in wound volume by X% from date: (specify in notes) Date Initiated: 04/12/2021 Date Inactivated: 01/04/2022 Target Resolution Date: 04/23/2021 Goal Status: Met Patient/caregiver will verbalize understanding of skin care regimen Date Initiated: 01/04/2022 Target Resolution Date: 04/26/2023 Goal Status: Active Ulcer/skin breakdown will have a volume reduction of 30% by week 4 Date Initiated: 04/12/2021 Date Inactivated: 04/27/2021 Target Resolution Date: 04/27/2021 Goal Status: Unmet Unmet Reason: infection Ulcer/skin breakdown will have a volume reduction of 50% by week 8 Date Initiated: 04/27/2021 Date Inactivated: 06/29/2021 Target Resolution Date: 06/24/2021 Goal Status: Met Interventions: Assess patient/caregiver ability to obtain necessary supplies Assess patient/caregiver ability to perform ulcer/skin care regimen upon admission and as needed Assess ulceration(s) every visit Notes: Electronic Signature(s) Signed: 04/11/2023 3:56:28 PM By: [name] Entered By: [name] on 04/11/2023 09:46:27 -------------------------------------------------------------------------------- Pain Assessment Details Patient Name: Date of Service: [name]. 04/11/2023 10:00 A M Medical Record Number: [id number] Patient Account  Number: [id number] Date of Birth/Sex: Treating RN: 1951-02-04 (72 y.o. [name] Primary Care Dontavis Tschantz: [name] Other Clinician: Referring Meloney Feld: Treating Leeandra Ellerson/Extender: [name] in Treatment: [phone] Active Problems Location of Pain Severity and Description of Pain Patient Has Paino No Site Locations Rate the pain. KENZO, OZMENT ([id number]) [id number] Page 6 of 10 Rate the pain. Current Pain Level: 0 Pain Management and Medication Current Pain Management: Electronic Signature(s) Signed: 04/11/2023 3:56:28 PM By: [name] Entered By: [name] on 04/11/2023 09:47:24 -------------------------------------------------------------------------------- Patient/Caregiver Education Details Patient Name: Date of Service: [name] 10/17/2024andnbsp10:00 A M Medical Record Number: [id number] Patient Account Number: [id number] Date of Birth/Gender: Treating RN: [birth year] (72 y.o. [name] Primary Care Physician: [name] Other Clinician: Referring Physician: Treating Physician/Extender: [name] in Treatment: [phone] Education Assessment Education Provided To: Patient Education Topics Provided Wound/Skin Impairment: Methods: Explain/Verbal Responses: Reinforcements needed, State content correctly Electronic Signature(s) Signed: 04/11/2023 3:56:28 PM By: [name] Entered By: [name] on 04/11/2023 09:46:42 -------------------------------------------------------------------------------- Wound Assessment Details Patient Name: Date of Service: [name]. 04/11/2023 10:00 A M Medical Record Number: [id number] Patient Account Number: [id number] Date of Birth/Sex: Treating RN: [birth year] (72 y.o. [name] Primary Care Cordelro Gautreau: [name] Other Clinician: Referring Annaston Upham: Treating Wilberto Console/Extender: Juluis, Fitzsimmons ([id number]) [id number].pdf Page 7 of 10 Weeks in Treatment: 104 Wound Status Wound Number: 18R Primary Diabetic Wound/Ulcer of the Lower Extremity Etiology: Wound Location: Left, Plantar Metatarsal head first Wound Open Wounding Event: Gradually Appeared Status: Date Acquired: 08/23/2020 Comorbid Glaucoma, Sleep Apnea, Hypertension, Peripheral Arterial Disease, Weeks Of Treatment: 104 History: Peripheral Venous Disease, Type II Diabetes, Gout, Osteoarthritis, Clustered Wound: No Neuropathy Photos Wound

## 2023-04-18 ENCOUNTER — Ambulatory Visit (HOSPITAL_BASED_OUTPATIENT_CLINIC_OR_DEPARTMENT_OTHER): Payer: BC Managed Care – PPO | Admitting: General Surgery

## 2023-04-25 ENCOUNTER — Encounter (HOSPITAL_BASED_OUTPATIENT_CLINIC_OR_DEPARTMENT_OTHER): Payer: Medicare Other | Admitting: General Surgery

## 2023-04-25 DIAGNOSIS — E11621 Type 2 diabetes mellitus with foot ulcer: Secondary | ICD-10-CM | POA: Diagnosis not present

## 2023-04-25 NOTE — Progress Notes (Signed)
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 Signature: 04/25/2023 11:04:30 AM Version By: [name] MD FACS Entered By: [name] on 04/25/2023 08:39:58 -------------------------------------------------------------------------------- Progress Note Details Patient Name: Date of Service: [name] Grapes. 04/25/2023 10:00 A M Medical Record Number: [id number] Patient Account Number: [id number] Date of Birth/Sex: Treating RN: 05/27/1951 (72 y.o. M) Primary Care Provider: [name] Other Clinician: Referring Provider: Treating Provider/Extender: [name] in Treatment: 106 Subjective Chief Complaint Information obtained from Patient Left leg and foot ulcers 04/12/2021; patient is here for wounds on his left lower leg and left plantar foot over the first metatarsal head History of Present Illness (HPI) 10/11/17; Mr. Etheredge is a 72 year old man who tells me that in 2015 he slipped down the latter traumatizing his left leg. He developed a wound in the same spot the area that we are currently looking at. He states this closed over for the most part although he always felt it was somewhat unstable. In 2016 he hit the same area with the door of his car had this reopened. He tells me that this is never really closed although sometimes an inflow it remains open on a constant basis. He has not been using any specific dressing to this except for topical antibiotics the nature of which were not really  sure. His primary doctor did send him to see Dr. Jacinto Halim of interventional cardiology. He underwent an angiogram on 08/06/17 and he underwent a PTA and directional atherectomy of the lesser distal SFA and popliteal arteries which resulted in brisk improvement in blood flow. It was noted that he had 2 vessel runoff through the anterior tibial and peroneal. He is also been to  wound improved quite significantly  over the last week. He has been applying additional gauze over the site before applying the Ace bandage and this seems to have minimized the rubbing. The foot wound is about the same. The culture that I took last week returned with a polymicrobial population of predominantly skin flora. He is currently taking Augmentin as recommended by the culture data. 02/14/2023: The lateral leg wound is covered in a layer of eschar. Underneath, the wound is nearly healed again. The foot wound looks better today. The depth around the edges has decreased. The tissue has a healthier appearance. He is still taking Augmentin. 02/21/2023: The lateral leg wound is smaller with a layer of eschar on the surface, but is not quite healed. The foot wound is stable but the depth around the edges is almost completely obliterated. There is some callus and senescent skin around the edges. 03/07/2023: The lateral leg wound is down to just a pinhole with a little bit of eschar on the surface. The foot wound demonstrates evidence that he has been walking excessively the past week and there is more callus with moisture underneath it, but the breakdown is limited to the callus layer and there is still intact skin underneath. 03/14/2023: The lateral leg wound is closed. The foot wound looks much improved this week. There is very minimal callus accumulation around the wound edges and the tissue surface appears healthier. It seems the adjustments made to his shoe by the [facility] were effective. 03/28/2023: The lateral leg wound has reopened. The patient reports that he was not moisturizing it as much as he probably should have been and it cracked and some skin peeled away. The foot wound shows signs of pressure-related trauma with some bruising and increased callus. The patient admits to extensive walking this past week while taking his wife to medical appointments. He does have a knee scooter and it is unclear why he does not use  it. 04/04/2023: The lateral leg wound is larger again today. The patient reports that he thinks maybe some dry skin pulled off when he was changing his dressing. It is quite dry and he states that he is using just water to moisten his endoform, rather than the hydrogel that was recommended. The foot wound is the same size, but the tissue is in better condition, without substantial bruising or friction-related trauma. [name], [name] ([id number]) [id number] Page 8 of 21 04/11/2023: Both wounds look better this week. Much of the lateral leg wound has epithelialized. The moisture balance is much better. The foot wound looks better than it has in months. The granulation tissue is flush with the surrounding skin and there is not much callus accumulation. 04/25/2023: The patient was out of town last week. The lateral leg ulcer has good moisture balance, but has opened up a little bit more at the most proximal aspect. The foot ulcer has a layer of callus around the outside but is otherwise fairly clean with minimal slough buildup. Electronic Signature(s) Signed: 04/25/2023 11:40:20 AM By: [name] MD FACS Previous Signature: 04/25/2023 11:13:29 AM Version By: [name] MD FACS Entered By: [name] on 04/25/2023 08:40:20 -------------------------------------------------------------------------------- Physical Exam Details Patient Name: Date of Service: [name] Grapes. 04/25/2023 10:00 A M Medical Record Number: [id number] Patient Account Number: [id number] Date of Birth/Sex: Treating RN: 09-Feb-1951 (72 y.o. 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 . . .  wound improved quite significantly  over the last week. He has been applying additional gauze over the site before applying the Ace bandage and this seems to have minimized the rubbing. The foot wound is about the same. The culture that I took last week returned with a polymicrobial population of predominantly skin flora. He is currently taking Augmentin as recommended by the culture data. 02/14/2023: The lateral leg wound is covered in a layer of eschar. Underneath, the wound is nearly healed again. The foot wound looks better today. The depth around the edges has decreased. The tissue has a healthier appearance. He is still taking Augmentin. 02/21/2023: The lateral leg wound is smaller with a layer of eschar on the surface, but is not quite healed. The foot wound is stable but the depth around the edges is almost completely obliterated. There is some callus and senescent skin around the edges. 03/07/2023: The lateral leg wound is down to just a pinhole with a little bit of eschar on the surface. The foot wound demonstrates evidence that he has been walking excessively the past week and there is more callus with moisture underneath it, but the breakdown is limited to the callus layer and there is still intact skin underneath. 03/14/2023: The lateral leg wound is closed. The foot wound looks much improved this week. There is very minimal callus accumulation around the wound edges and the tissue surface appears healthier. It seems the adjustments made to his shoe by the [facility] were effective. 03/28/2023: The lateral leg wound has reopened. The patient reports that he was not moisturizing it as much as he probably should have been and it cracked and some skin peeled away. The foot wound shows signs of pressure-related trauma with some bruising and increased callus. The patient admits to extensive walking this past week while taking his wife to medical appointments. He does have a knee scooter and it is unclear why he does not use  it. 04/04/2023: The lateral leg wound is larger again today. The patient reports that he thinks maybe some dry skin pulled off when he was changing his dressing. It is quite dry and he states that he is using just water to moisten his endoform, rather than the hydrogel that was recommended. The foot wound is the same size, but the tissue is in better condition, without substantial bruising or friction-related trauma. [name], [name] ([id number]) [id number] Page 8 of 21 04/11/2023: Both wounds look better this week. Much of the lateral leg wound has epithelialized. The moisture balance is much better. The foot wound looks better than it has in months. The granulation tissue is flush with the surrounding skin and there is not much callus accumulation. 04/25/2023: The patient was out of town last week. The lateral leg ulcer has good moisture balance, but has opened up a little bit more at the most proximal aspect. The foot ulcer has a layer of callus around the outside but is otherwise fairly clean with minimal slough buildup. Electronic Signature(s) Signed: 04/25/2023 11:40:20 AM By: [name] MD FACS Previous Signature: 04/25/2023 11:13:29 AM Version By: [name] MD FACS Entered By: [name] on 04/25/2023 08:40:20 -------------------------------------------------------------------------------- Physical Exam Details Patient Name: Date of Service: [name] Grapes. 04/25/2023 10:00 A M Medical Record Number: [id number] Patient Account Number: [id number] Date of Birth/Sex: Treating RN: 09-Feb-1951 (72 y.o. 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 . . .  wound improved quite significantly  over the last week. He has been applying additional gauze over the site before applying the Ace bandage and this seems to have minimized the rubbing. The foot wound is about the same. The culture that I took last week returned with a polymicrobial population of predominantly skin flora. He is currently taking Augmentin as recommended by the culture data. 02/14/2023: The lateral leg wound is covered in a layer of eschar. Underneath, the wound is nearly healed again. The foot wound looks better today. The depth around the edges has decreased. The tissue has a healthier appearance. He is still taking Augmentin. 02/21/2023: The lateral leg wound is smaller with a layer of eschar on the surface, but is not quite healed. The foot wound is stable but the depth around the edges is almost completely obliterated. There is some callus and senescent skin around the edges. 03/07/2023: The lateral leg wound is down to just a pinhole with a little bit of eschar on the surface. The foot wound demonstrates evidence that he has been walking excessively the past week and there is more callus with moisture underneath it, but the breakdown is limited to the callus layer and there is still intact skin underneath. 03/14/2023: The lateral leg wound is closed. The foot wound looks much improved this week. There is very minimal callus accumulation around the wound edges and the tissue surface appears healthier. It seems the adjustments made to his shoe by the [facility] were effective. 03/28/2023: The lateral leg wound has reopened. The patient reports that he was not moisturizing it as much as he probably should have been and it cracked and some skin peeled away. The foot wound shows signs of pressure-related trauma with some bruising and increased callus. The patient admits to extensive walking this past week while taking his wife to medical appointments. He does have a knee scooter and it is unclear why he does not use  it. 04/04/2023: The lateral leg wound is larger again today. The patient reports that he thinks maybe some dry skin pulled off when he was changing his dressing. It is quite dry and he states that he is using just water to moisten his endoform, rather than the hydrogel that was recommended. The foot wound is the same size, but the tissue is in better condition, without substantial bruising or friction-related trauma. [name], [name] ([id number]) [id number] Page 8 of 21 04/11/2023: Both wounds look better this week. Much of the lateral leg wound has epithelialized. The moisture balance is much better. The foot wound looks better than it has in months. The granulation tissue is flush with the surrounding skin and there is not much callus accumulation. 04/25/2023: The patient was out of town last week. The lateral leg ulcer has good moisture balance, but has opened up a little bit more at the most proximal aspect. The foot ulcer has a layer of callus around the outside but is otherwise fairly clean with minimal slough buildup. Electronic Signature(s) Signed: 04/25/2023 11:40:20 AM By: [name] MD FACS Previous Signature: 04/25/2023 11:13:29 AM Version By: [name] MD FACS Entered By: [name] on 04/25/2023 08:40:20 -------------------------------------------------------------------------------- Physical Exam Details Patient Name: Date of Service: [name] Grapes. 04/25/2023 10:00 A M Medical Record Number: [id number] Patient Account Number: [id number] Date of Birth/Sex: Treating RN: 09-Feb-1951 (72 y.o. 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 . . .  Days Discharge Instructions: Secure with tape as directed. Patient Medications llergies: No Known Drug Allergies A Notifications Medication Indication Start End 04/25/2023 lidocaine DOSE topical 4 % cream - cream topical Electronic Signature(s) Signed: 04/25/2023 3:34:18 PM By: [name] MD FACS Previous Signature: 04/25/2023 11:43:12 AM Version By: [name] Entered By: [name] on 04/25/2023 08:43:23 Serano, Dola Factor ([id number]) [id number] Page 10 of 21 -------------------------------------------------------------------------------- Problem List Details Patient Name: Date of Service: [name], [name] 04/25/2023 10:00 A M Medical Record Number: [id number] Patient Account Number: [id number] Date of Birth/Sex: Treating RN: 07-21-1950 (72 y.o. 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 03/28/2023 No Yes I87.322 Chronic venous hypertension (idiopathic) with inflammation of left lower 04/12/2021 No Yes extremity L85.9 Epidermal thickening, unspecified 08/30/2022 No Yes E11.51 Type 2 diabetes mellitus with diabetic peripheral angiopathy without gangrene 04/12/2021 No Yes I89.0 Lymphedema, not elsewhere classified 04/12/2021 No Yes Inactive Problems ICD-10 Code Description Active Date Inactive Date E11.621 Type 2 diabetes mellitus with foot ulcer 04/12/2021 04/12/2021 E11.42 Type 2 diabetes mellitus with diabetic polyneuropathy 04/12/2021 04/12/2021 L02.416 Cutaneous abscess of left lower limb 06/13/2021 06/13/2021 Resolved Problems ICD-10 Code  Description Active Date Resolved Date L97.828 Non-pressure chronic ulcer of other part of left lower leg with other specified severity 04/12/2021 04/12/2021 L97.828 Non-pressure chronic ulcer of other part of left lower leg with other specified severity 01/10/2023 06/08/2022 Electronic Signature(s) Signed: 04/25/2023 11:39:59 AM By: [name] MD FACS [name] ([id number]) [id number] Page 11 of 21 Signed: 04/25/2023 11:39:59 AM By: [name] MD FACS Previous Signature: 04/25/2023 11:04:30 AM Version By: [name] MD FACS Entered By: [name] on 04/25/2023 08:39:58 -------------------------------------------------------------------------------- Progress Note Details Patient Name: Date of Service: [name] Grapes. 04/25/2023 10:00 A M Medical Record Number: [id number] Patient Account Number: [id number] Date of Birth/Sex: Treating RN: 05/27/1951 (72 y.o. M) Primary Care Provider: [name] Other Clinician: Referring Provider: Treating Provider/Extender: [name] in Treatment: 106 Subjective Chief Complaint Information obtained from Patient Left leg and foot ulcers 04/12/2021; patient is here for wounds on his left lower leg and left plantar foot over the first metatarsal head History of Present Illness (HPI) 10/11/17; Mr. Etheredge is a 72 year old man who tells me that in 2015 he slipped down the latter traumatizing his left leg. He developed a wound in the same spot the area that we are currently looking at. He states this closed over for the most part although he always felt it was somewhat unstable. In 2016 he hit the same area with the door of his car had this reopened. He tells me that this is never really closed although sometimes an inflow it remains open on a constant basis. He has not been using any specific dressing to this except for topical antibiotics the nature of which were not really  sure. His primary doctor did send him to see Dr. Jacinto Halim of interventional cardiology. He underwent an angiogram on 08/06/17 and he underwent a PTA and directional atherectomy of the lesser distal SFA and popliteal arteries which resulted in brisk improvement in blood flow. It was noted that he had 2 vessel runoff through the anterior tibial and peroneal. He is also been to  [name], [name] 04/25/2023 Medical Record Number: [id number] Patient Account Number: [id number] Date of Birth/Sex: Treating RN: 1951-01-08 (72 y.o. 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 I87.322 Chronic venous hypertension (idiopathic) with inflammation of left lower extremity L85.9 Epidermal thickening, unspecified E11.51 Type 2 diabetes mellitus with diabetic peripheral angiopathy without gangrene I89.0 Lymphedema, not elsewhere classified Facility Procedures : CPT4 Code: [id number] Description: 11042 - DEB  SUBQ TISSUE 20 SQ CM/< ICD-10 Diagnosis Description L97.528 Non-pressure chronic ulcer of other part of left foot with other specified seve Modifier: rity Quantity: 1 Physician Procedures : CPT4 Code Description Modifier [id number] 99213 - WC PHYS LEVEL 3 - EST PT ICD-10 Diagnosis Description 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 Respiratory Normal work of breathing on room air.. Notes 04/25/2023: The lateral leg ulcer has good moisture balance,  but has opened up a little bit more at the most proximal aspect. The foot ulcer has a layer of callus around the outside but is otherwise fairly clean with minimal slough buildup. Electronic Signature(s) Signed: 04/25/2023 11:43:07 AM By: [name] MD FACS Entered By: [name] on 04/25/2023 08:43:07 -------------------------------------------------------------------------------- Physician Orders Details Patient Name: Date of Service: [name] Grapes. 04/25/2023 10:00 A M Medical Record Number: [id number] Patient Account Number: [id number] Date of Birth/Sex: Treating RN: 1951/02/27 (72 y.o. 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 - Dr Lady Gary - Room 2 Return A Anesthetic (In clinic) Topical Lidocaine 4% applied to wound bed Bathing/ Shower/ Hygiene May shower and wash wound with soap and water. Off-Loading Wound #18R Left,Plantar Metatarsal head first Open toe surgical shoe to: - Front off loader Left ft Other: - minimal weight bearing left foot Additional Orders / Instructions Follow Nutritious Diet -  vitamin C 500 mg 3 times a day and zinc 30-50 mg a day Wound Treatment Wound #18R - Metatarsal head first Wound Laterality: Plantar, Left Cleanser: Soap and Water 1 x Per Week/30 Days Discharge Instructions: May shower and wash wound with dial antibacterial soap and water prior to dressing change. Cleanser: Wound Cleanser 1 x Per Week/30 Days Discharge Instructions: Cleanse the wound with wound cleanser prior to applying a clean dressing using gauze sponges, not tissue or cotton balls. Prim Dressing: Maxorb Extra Ag+ Alginate Dressing, 2x2 (in/in) 1 x Per Week/30 Days ary Discharge Instructions: Apply to wound bed as instructed Secondary Dressing: Optifoam Non-Adhesive Dressing, 4x4 in (Generic) 1 x Per Week/30 Days Discharge Instructions: Apply over primary dressing as directed. Secondary Dressing: Woven Gauze Sponges 2x2 in (Generic) 1 x Per Week/30 Days Discharge Instructions: Apply over primary dressing as directed. Secured With: 86M Medipore H Soft Cloth Surgical T ape, 4 x 10 (in/yd) 1 x Per Week/30 Days Discharge Instructions: Secure with tape as directed. Wound #22R - Lower Leg Wound Laterality: Left, Lateral, Proximal Cleanser: Soap and Water 1 x Per Day/30 Days Discharge Instructions: May shower and wash wound with dial antibacterial soap and water prior to dressing change. Cleanser: Wound Cleanser 1 x Per Day/30 Days Discharge Instructions: Cleanse the wound with wound cleanser prior to applying a clean dressing using gauze sponges, not tissue or cotton balls. Topical: Skintegrity Hydrogel 4 (oz) 1 x Per Day/30 Days Discharge Instructions: Apply hydrogel as directed Prim Dressing: Endoform 2x2 in 1 x Per Day/30 Days ary Discharge Instructions: Moisten with saline Secondary Dressing: Woven Gauze Sponge, Non-Sterile 4x4 in 1 x Per Day/30 Days Discharge Instructions: Apply over primary dressing as directed. Secured With: 86M Medipore H Soft Cloth Surgical T ape, 4 x 10 (in/yd) 1 x  Per Day/30  [name], [name] 04/25/2023 Medical Record Number: [id number] Patient Account Number: [id number] Date of Birth/Sex: Treating RN: 1951-01-08 (72 y.o. 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 I87.322 Chronic venous hypertension (idiopathic) with inflammation of left lower extremity L85.9 Epidermal thickening, unspecified E11.51 Type 2 diabetes mellitus with diabetic peripheral angiopathy without gangrene I89.0 Lymphedema, not elsewhere classified Facility Procedures : CPT4 Code: [id number] Description: 11042 - DEB  SUBQ TISSUE 20 SQ CM/< ICD-10 Diagnosis Description L97.528 Non-pressure chronic ulcer of other part of left foot with other specified seve Modifier: rity Quantity: 1 Physician Procedures : CPT4 Code Description Modifier [id number] 99213 - WC PHYS LEVEL 3 - EST PT ICD-10 Diagnosis Description 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 Debridement Severity of Tissue Pre Debridement: Fat layer exposed Level of Consciousness (Pre-procedure): Awake and Alert Pre-procedure Verification/Time Out Yes - 10:29 Taken: Start Time: 10:29 Pain Control: Lidocaine 4% Topical Solution Percent of Wound Bed Debrided: 100% T Area Debrided (cm): otal 2.25 Tissue and other material debrided: Non-Viable, Callus, Slough, Subcutaneous, Skin: Epidermis, Slough Level: Skin/Subcutaneous Tissue Debridement Description: Excisional Instrument:  Curette Bleeding: Minimum Hemostasis Achieved: Pressure Response to Treatment: Procedure was tolerated well Level of Consciousness (Post- Awake and Alert procedure): Post Debridement Measurements of Total Wound Length: (cm) 1.3 Width: (cm) 2.2 Depth: (cm) 0.1 Volume: (cm) 0.225 Character of Wound/Ulcer Post Debridement: Improved Severity of Tissue Post Debridement: Fat layer exposed [name] ([id number]) [id number] Page 2 of 21 Post Procedure Diagnosis Same as Pre-procedure Electronic Signature(s) Signed: 04/25/2023 11:05:05 AM By: [name] MD FACS Entered By: [name] on 04/25/2023 08:05:05 -------------------------------------------------------------------------------- HPI Details Patient Name: Date of Service: [name] Grapes. 04/25/2023 10:00 A M Medical Record Number: [id number] Patient Account Number: [id number] Date of Birth/Sex: Treating RN: [birth year] (72 y.o. M) Primary Care Provider: [name] Other Clinician: Referring Provider: Treating Provider/Extender: [name] in Treatment: 106 History of Present Illness HPI Description: 10/11/17; Mr. Button is a 72 year old man who tells me that in 2015 he slipped down the latter traumatizing his left leg. He developed a wound in the same spot the area that we are currently looking at. He states this closed over for the most part although he always felt it was somewhat unstable. In 2016 he hit the same area with the door of his car had this reopened. He tells me that this is never really closed although sometimes an inflow it remains open on a constant basis. He has not been using any specific dressing to this except for topical antibiotics the nature of which were not really sure. His primary doctor did send him to see Dr. Jacinto Halim of interventional cardiology. He underwent an angiogram on 08/06/17 and he underwent a PTA and directional atherectomy of the  lesser distal SFA and popliteal arteries which resulted in brisk improvement in blood flow. It was noted that he had 2 vessel runoff through the anterior tibial and peroneal. He is also been to see vascular and interventional radiologist. He was not felt to have any significant superficial venous insufficiency. Presumably is not a candidate for any ablation. It was suggested he come here  wound improved quite significantly  over the last week. He has been applying additional gauze over the site before applying the Ace bandage and this seems to have minimized the rubbing. The foot wound is about the same. The culture that I took last week returned with a polymicrobial population of predominantly skin flora. He is currently taking Augmentin as recommended by the culture data. 02/14/2023: The lateral leg wound is covered in a layer of eschar. Underneath, the wound is nearly healed again. The foot wound looks better today. The depth around the edges has decreased. The tissue has a healthier appearance. He is still taking Augmentin. 02/21/2023: The lateral leg wound is smaller with a layer of eschar on the surface, but is not quite healed. The foot wound is stable but the depth around the edges is almost completely obliterated. There is some callus and senescent skin around the edges. 03/07/2023: The lateral leg wound is down to just a pinhole with a little bit of eschar on the surface. The foot wound demonstrates evidence that he has been walking excessively the past week and there is more callus with moisture underneath it, but the breakdown is limited to the callus layer and there is still intact skin underneath. 03/14/2023: The lateral leg wound is closed. The foot wound looks much improved this week. There is very minimal callus accumulation around the wound edges and the tissue surface appears healthier. It seems the adjustments made to his shoe by the [facility] were effective. 03/28/2023: The lateral leg wound has reopened. The patient reports that he was not moisturizing it as much as he probably should have been and it cracked and some skin peeled away. The foot wound shows signs of pressure-related trauma with some bruising and increased callus. The patient admits to extensive walking this past week while taking his wife to medical appointments. He does have a knee scooter and it is unclear why he does not use  it. 04/04/2023: The lateral leg wound is larger again today. The patient reports that he thinks maybe some dry skin pulled off when he was changing his dressing. It is quite dry and he states that he is using just water to moisten his endoform, rather than the hydrogel that was recommended. The foot wound is the same size, but the tissue is in better condition, without substantial bruising or friction-related trauma. [name], [name] ([id number]) [id number] Page 8 of 21 04/11/2023: Both wounds look better this week. Much of the lateral leg wound has epithelialized. The moisture balance is much better. The foot wound looks better than it has in months. The granulation tissue is flush with the surrounding skin and there is not much callus accumulation. 04/25/2023: The patient was out of town last week. The lateral leg ulcer has good moisture balance, but has opened up a little bit more at the most proximal aspect. The foot ulcer has a layer of callus around the outside but is otherwise fairly clean with minimal slough buildup. Electronic Signature(s) Signed: 04/25/2023 11:40:20 AM By: [name] MD FACS Previous Signature: 04/25/2023 11:13:29 AM Version By: [name] MD FACS Entered By: [name] on 04/25/2023 08:40:20 -------------------------------------------------------------------------------- Physical Exam Details Patient Name: Date of Service: [name] Grapes. 04/25/2023 10:00 A M Medical Record Number: [id number] Patient Account Number: [id number] Date of Birth/Sex: Treating RN: 09-Feb-1951 (72 y.o. 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 . . .  wound improved quite significantly  over the last week. He has been applying additional gauze over the site before applying the Ace bandage and this seems to have minimized the rubbing. The foot wound is about the same. The culture that I took last week returned with a polymicrobial population of predominantly skin flora. He is currently taking Augmentin as recommended by the culture data. 02/14/2023: The lateral leg wound is covered in a layer of eschar. Underneath, the wound is nearly healed again. The foot wound looks better today. The depth around the edges has decreased. The tissue has a healthier appearance. He is still taking Augmentin. 02/21/2023: The lateral leg wound is smaller with a layer of eschar on the surface, but is not quite healed. The foot wound is stable but the depth around the edges is almost completely obliterated. There is some callus and senescent skin around the edges. 03/07/2023: The lateral leg wound is down to just a pinhole with a little bit of eschar on the surface. The foot wound demonstrates evidence that he has been walking excessively the past week and there is more callus with moisture underneath it, but the breakdown is limited to the callus layer and there is still intact skin underneath. 03/14/2023: The lateral leg wound is closed. The foot wound looks much improved this week. There is very minimal callus accumulation around the wound edges and the tissue surface appears healthier. It seems the adjustments made to his shoe by the [facility] were effective. 03/28/2023: The lateral leg wound has reopened. The patient reports that he was not moisturizing it as much as he probably should have been and it cracked and some skin peeled away. The foot wound shows signs of pressure-related trauma with some bruising and increased callus. The patient admits to extensive walking this past week while taking his wife to medical appointments. He does have a knee scooter and it is unclear why he does not use  it. 04/04/2023: The lateral leg wound is larger again today. The patient reports that he thinks maybe some dry skin pulled off when he was changing his dressing. It is quite dry and he states that he is using just water to moisten his endoform, rather than the hydrogel that was recommended. The foot wound is the same size, but the tissue is in better condition, without substantial bruising or friction-related trauma. [name], [name] ([id number]) [id number] Page 8 of 21 04/11/2023: Both wounds look better this week. Much of the lateral leg wound has epithelialized. The moisture balance is much better. The foot wound looks better than it has in months. The granulation tissue is flush with the surrounding skin and there is not much callus accumulation. 04/25/2023: The patient was out of town last week. The lateral leg ulcer has good moisture balance, but has opened up a little bit more at the most proximal aspect. The foot ulcer has a layer of callus around the outside but is otherwise fairly clean with minimal slough buildup. Electronic Signature(s) Signed: 04/25/2023 11:40:20 AM By: [name] MD FACS Previous Signature: 04/25/2023 11:13:29 AM Version By: [name] MD FACS Entered By: [name] on 04/25/2023 08:40:20 -------------------------------------------------------------------------------- Physical Exam Details Patient Name: Date of Service: [name] Grapes. 04/25/2023 10:00 A M Medical Record Number: [id number] Patient Account Number: [id number] Date of Birth/Sex: Treating RN: 09-Feb-1951 (72 y.o. 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 . . .  [name], [name] 04/25/2023 Medical Record Number: [id number] Patient Account Number: [id number] Date of Birth/Sex: Treating RN: 1951-01-08 (72 y.o. 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 I87.322 Chronic venous hypertension (idiopathic) with inflammation of left lower extremity L85.9 Epidermal thickening, unspecified E11.51 Type 2 diabetes mellitus with diabetic peripheral angiopathy without gangrene I89.0 Lymphedema, not elsewhere classified Facility Procedures : CPT4 Code: [id number] Description: 11042 - DEB  SUBQ TISSUE 20 SQ CM/< ICD-10 Diagnosis Description L97.528 Non-pressure chronic ulcer of other part of left foot with other specified seve Modifier: rity Quantity: 1 Physician Procedures : CPT4 Code Description Modifier [id number] 99213 - WC PHYS LEVEL 3 - EST PT ICD-10 Diagnosis Description 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 Patient Medications llergies: No Known Drug Allergies A Notifications Medication Indication Start End 04/25/2023 lidocaine DOSE topical 4 % cream - cream topical Electronic Signature(s) Signed: 04/25/2023 3:34:18 PM By: [name] MD FACS Previous Signature: 04/25/2023 11:43:12 AM Version By: [name] Entered By: [name] on 04/25/2023 08:43:23 Serano, Dola Factor ([id number]) [id number] Page 10 of 21 -------------------------------------------------------------------------------- Problem List Details Patient Name: Date of Service: [name], [name] 04/25/2023 10:00 A M Medical Record Number: [id number] Patient Account Number: [id number] Date of Birth/Sex: Treating RN: 07-21-1950 (72 y.o. 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 03/28/2023 No Yes I87.322 Chronic venous hypertension (idiopathic) with inflammation of left lower 04/12/2021 No Yes extremity L85.9 Epidermal thickening, unspecified 08/30/2022 No Yes E11.51 Type 2 diabetes mellitus with diabetic peripheral angiopathy without gangrene 04/12/2021 No Yes I89.0 Lymphedema, not elsewhere classified 04/12/2021 No Yes Inactive Problems ICD-10 Code Description Active Date Inactive Date E11.621 Type 2 diabetes mellitus with foot ulcer 04/12/2021 04/12/2021 E11.42 Type 2 diabetes mellitus with diabetic polyneuropathy 04/12/2021 04/12/2021 L02.416 Cutaneous abscess of left lower limb 06/13/2021 06/13/2021 Resolved Problems ICD-10 Code  Description Active Date Resolved Date L97.828 Non-pressure chronic ulcer of other part of left lower leg with other specified severity 04/12/2021 04/12/2021 L97.828 Non-pressure chronic ulcer of other part of left lower leg with other specified severity 01/10/2023 06/08/2022 Electronic Signature(s) Signed: 04/25/2023 11:39:59 AM By: [name] MD FACS [name] ([id number]) [id number] Page 11 of 21 Signed: 04/25/2023 11:39:59 AM By: [name] MD FACS Previous Signature: 04/25/2023 11:04:30 AM Version By: [name] MD FACS Entered By: [name] on 04/25/2023 08:39:58 -------------------------------------------------------------------------------- Progress Note Details Patient Name: Date of Service: [name] Grapes. 04/25/2023 10:00 A M Medical Record Number: [id number] Patient Account Number: [id number] Date of Birth/Sex: Treating RN: 05/27/1951 (72 y.o. M) Primary Care Provider: [name] Other Clinician: Referring Provider: Treating Provider/Extender: [name] in Treatment: 106 Subjective Chief Complaint Information obtained from Patient Left leg and foot ulcers 04/12/2021; patient is here for wounds on his left lower leg and left plantar foot over the first metatarsal head History of Present Illness (HPI) 10/11/17; Mr. Etheredge is a 72 year old man who tells me that in 2015 he slipped down the latter traumatizing his left leg. He developed a wound in the same spot the area that we are currently looking at. He states this closed over for the most part although he always felt it was somewhat unstable. In 2016 he hit the same area with the door of his car had this reopened. He tells me that this is never really closed although sometimes an inflow it remains open on a constant basis. He has not been using any specific dressing to this except for topical antibiotics the nature of which were not really  sure. His primary doctor did send him to see Dr. Jacinto Halim of interventional cardiology. He underwent an angiogram on 08/06/17 and he underwent a PTA and directional atherectomy of the lesser distal SFA and popliteal arteries which resulted in brisk improvement in blood flow. It was noted that he had 2 vessel runoff through the anterior tibial and peroneal. He is also been to  no acute distress. Respiratory Normal work of breathing on room air.. Notes 04/25/2023: The lateral leg ulcer has good moisture balance,  but has opened up a little bit more at the most proximal aspect. The foot ulcer has a layer of callus around the outside but is otherwise fairly clean with minimal slough buildup. Electronic Signature(s) Signed: 04/25/2023 11:43:07 AM By: [name] MD FACS Entered By: [name] on 04/25/2023 08:43:07 -------------------------------------------------------------------------------- Physician Orders Details Patient Name: Date of Service: [name] Grapes. 04/25/2023 10:00 A M Medical Record Number: [id number] Patient Account Number: [id number] Date of Birth/Sex: Treating RN: 1951/02/27 (72 y.o. 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 - Dr Lady Gary - Room 2 Return A Anesthetic (In clinic) Topical Lidocaine 4% applied to wound bed Bathing/ Shower/ Hygiene May shower and wash wound with soap and water. Off-Loading Wound #18R Left,Plantar Metatarsal head first Open toe surgical shoe to: - Front off loader Left ft Other: - minimal weight bearing left foot Additional Orders / Instructions Follow Nutritious Diet -  vitamin C 500 mg 3 times a day and zinc 30-50 mg a day Wound Treatment Wound #18R - Metatarsal head first Wound Laterality: Plantar, Left Cleanser: Soap and Water 1 x Per Week/30 Days Discharge Instructions: May shower and wash wound with dial antibacterial soap and water prior to dressing change. Cleanser: Wound Cleanser 1 x Per Week/30 Days Discharge Instructions: Cleanse the wound with wound cleanser prior to applying a clean dressing using gauze sponges, not tissue or cotton balls. Prim Dressing: Maxorb Extra Ag+ Alginate Dressing, 2x2 (in/in) 1 x Per Week/30 Days ary Discharge Instructions: Apply to wound bed as instructed Secondary Dressing: Optifoam Non-Adhesive Dressing, 4x4 in (Generic) 1 x Per Week/30 Days Discharge Instructions: Apply over primary dressing as directed. Secondary Dressing: Woven Gauze Sponges 2x2 in (Generic) 1 x Per Week/30 Days Discharge Instructions: Apply over primary dressing as directed. Secured With: 86M Medipore H Soft Cloth Surgical T ape, 4 x 10 (in/yd) 1 x Per Week/30 Days Discharge Instructions: Secure with tape as directed. Wound #22R - Lower Leg Wound Laterality: Left, Lateral, Proximal Cleanser: Soap and Water 1 x Per Day/30 Days Discharge Instructions: May shower and wash wound with dial antibacterial soap and water prior to dressing change. Cleanser: Wound Cleanser 1 x Per Day/30 Days Discharge Instructions: Cleanse the wound with wound cleanser prior to applying a clean dressing using gauze sponges, not tissue or cotton balls. Topical: Skintegrity Hydrogel 4 (oz) 1 x Per Day/30 Days Discharge Instructions: Apply hydrogel as directed Prim Dressing: Endoform 2x2 in 1 x Per Day/30 Days ary Discharge Instructions: Moisten with saline Secondary Dressing: Woven Gauze Sponge, Non-Sterile 4x4 in 1 x Per Day/30 Days Discharge Instructions: Apply over primary dressing as directed. Secured With: 86M Medipore H Soft Cloth Surgical T ape, 4 x 10 (in/yd) 1 x  Per Day/30  [name], [name] 04/25/2023 Medical Record Number: [id number] Patient Account Number: [id number] Date of Birth/Sex: Treating RN: 1951-01-08 (72 y.o. 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 I87.322 Chronic venous hypertension (idiopathic) with inflammation of left lower extremity L85.9 Epidermal thickening, unspecified E11.51 Type 2 diabetes mellitus with diabetic peripheral angiopathy without gangrene I89.0 Lymphedema, not elsewhere classified Facility Procedures : CPT4 Code: [id number] Description: 11042 - DEB  SUBQ TISSUE 20 SQ CM/< ICD-10 Diagnosis Description L97.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 Non-pressure chronic ulcer of other part of left foot with other specified seve Modifier: rity Quantity: 1 Physician Procedures : CPT4 Code Description Modifier [id number] 99213 - WC PHYS LEVEL 3 - EST PT ICD-10 Diagnosis Description 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 He has been applying additional gauze over the site before applying the Ace bandage and this seems to have minimized the rubbing. The foot wound is about the same. The culture that I took last week returned with a polymicrobial population of predominantly skin flora. He is currently taking Augmentin as recommended by the culture data. 02/14/2023: The lateral leg wound is covered in a layer of eschar. Underneath, the wound is nearly healed again. The foot wound looks better today. The depth around the edges has decreased. The tissue has a healthier appearance. He is still taking Augmentin. 02/21/2023: The lateral leg wound is smaller with a layer of eschar on the surface, but is not quite healed. The foot wound is stable but the depth around the edges is almost completely obliterated. There is some callus and senescent skin around the edges. 03/07/2023: The lateral leg wound is down to just a pinhole with a little bit of eschar on the surface. The foot wound demonstrates evidence that he has been walking excessively the past week and there is more callus with moisture underneath it, but the breakdown is limited to the callus layer and there is still intact skin underneath. 03/14/2023: The lateral leg wound is closed. The foot wound looks much improved this week. There is very minimal callus accumulation around the wound edges and the tissue surface appears healthier. It seems the adjustments made to his shoe by the [facility] were effective. 03/28/2023: The lateral leg wound has reopened. The patient reports that he was not moisturizing it as much as he probably should have been and it cracked and some skin peeled away. The foot wound shows signs of pressure-related trauma with some bruising and increased callus. The patient admits to extensive walking this past week while taking his wife to medical appointments. He does have a knee scooter and it is unclear why he does not use  it. 04/04/2023: The lateral leg wound is larger again today. The patient reports that he thinks maybe some dry skin pulled off when he was changing his dressing. It is quite dry and he states that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

## 2023-04-25 NOTE — Progress Notes (Signed)
[name], [name] ([id number]) [id number] Page 1 of 9 Visit Report for 04/25/2023 Arrival Information Details Patient Name: Date of Service: [name], [name] 04/25/2023 10:00 A M Medical Record Number: [id number] Patient Account Number: [id number] Date of Birth/Sex: Treating RN: April 19, 1951 (72 y.o. [name] Primary Care Dorwin Fitzhenry: [name] Other Clinician: Referring Conni Knighton: Treating Khrystyne Arpin/Extender: [name] in Treatment: 106 Visit Information History Since Last Visit Added or deleted any medications: No Patient Arrived: Ambulatory Any new allergies or adverse reactions: No Arrival Time: 10:12 Had a fall or experienced change in No Accompanied By: self activities of daily living that may affect Transfer Assistance: None risk of falls: Patient Identification Verified: Yes Signs or symptoms of abuse/neglect since last visito No Secondary Verification Process Completed: Yes Hospitalized since last visit: No Patient Requires Transmission-Based Precautions: No Implantable device outside of the clinic excluding No Patient Has Alerts: Yes cellular tissue based products placed in the center since last visit: Has Dressing in Place as Prescribed: Yes Pain Present Now: No Electronic Signature(s) Signed: 04/25/2023 11:43:12 AM By: [name] Entered By: [name] on 04/25/2023 10:14:35 -------------------------------------------------------------------------------- Encounter Discharge Information Details Patient Name: Date of Service: [name] Grapes. 04/25/2023 10:00 A M Medical Record Number: [id number] Patient Account Number: [id number] Date of Birth/Sex: Treating RN: [birth year] (72 y.o. [name] Primary Care Square Jowett: [name] Other Clinician: Referring Aroush Chasse: Treating Bonney Berres/Extender: [name] in Treatment: [phone] Encounter Discharge Information  Items Post Procedure Vitals Discharge Condition: Stable Temperature (F): 97.7 Ambulatory Status: Ambulatory Pulse (bpm): 70 Discharge Destination: Home Respiratory Rate (breaths/min): 16 Transportation: Private Auto Blood Pressure (mmHg): 158/90 Accompanied By: self Schedule Follow-up Appointment: Yes Clinical Summary of Care: Patient Declined Electronic Signature(s) Signed: 04/25/2023 11:43:12 AM By: [name] Entered By: [name] on 04/25/2023 10:46:54 [name] ([id number]) [id number] Page 2 of 9 -------------------------------------------------------------------------------- Lower Extremity Assessment Details Patient Name: Date of Service: [name], [name] 04/25/2023 10:00 A M Medical Record Number: [id number] Patient Account Number: [id number] Date of Birth/Sex: Treating RN: [birth year] (72 y.o. [name] Primary Care Decari Duggar: [name] Other Clinician: Referring Zaelynn Fuchs: Treating Hyla Coard/Extender: [name] in Treatment: 106 Edema Assessment Assessed: [name]: No] [name]: No] Edema: [Left: Ye] [Right: s] Calf Left: Right: Point of Measurement: 41 cm From Medial Instep 47.5 cm Ankle Left: Right: Point of Measurement: 10 cm From Medial Instep 28 cm Vascular Assessment Pulses: Dorsalis Pedis Palpable: [Left:Yes] Extremity colors, hair growth, and conditions: Extremity Color: [Left:Hyperpigmented] Hair Growth on Extremity: [Left:No] Temperature of Extremity: [Left:Warm] Capillary Refill: [Left:< 3 seconds] Dependent Rubor: [Left:No No] Electronic Signature(s) Signed: 04/25/2023 11:43:12 AM By: [name] Entered By: [name] on 04/25/2023 10:20:12 -------------------------------------------------------------------------------- Multi Wound Chart Details Patient Name: Date of Service: [name] Grapes. 04/25/2023 10:00 A M Medical Record Number:  [id number] Patient Account Number: [id number] Date of Birth/Sex: Treating RN: 09-02-1950 (72 y.o. M) Primary Care [place]: [name] Other Clinician: Referring Aasim Restivo: Treating Abanoub Hanken/Extender: [name] in Treatment: 106 Vital Signs Height(in): 74 Pulse(bpm): 70 Weight(lbs): 238 Blood Pressure(mmHg): 158/90 Body Mass Index(BMI): 30.6 Temperature(F): 97.7 Respiratory Rate(breaths/min): 16 [18R:Photos:] [22R:No Photos] [N/A:N/A [id number] Page 3 of 9] Left, Plantar Metatarsal head first Left, Proximal, Lateral Lower Leg N/A Wound Location: Gradually Appeared Bump N/A Wounding Event: Diabetic Wound/Ulcer of the Lower Cyst N/A Primary Etiology: Extremity Glaucoma, Sleep Apnea, Glaucoma, Sleep Apnea, N/A Comorbid History: Hypertension, Peripheral Arterial Hypertension, Peripheral Arterial Disease, Peripheral Venous Disease, Disease, Peripheral  over primary dressing as directed. Secured With 61M Medipore H Soft Cloth Surgical T ape, 4 x 10 (in/yd) Discharge Instruction: Secure with tape as directed. Compression Wrap Compression Stockings Add-Ons Electronic Signature(s) Signed: 04/25/2023 11:43:12 AM By: [name] Entered By: [name] on 04/25/2023 10:23:23 -------------------------------------------------------------------------------- Vitals Details Patient Name: Date of Service: [name] Grapes. 04/25/2023 10:00 A M Medical Record Number: [id number] Patient Account Number: [id number] Date of Birth/Sex: Treating RN: 1950/12/23 (72 y.o. [name] Primary Care Andalyn Heckstall: [name] Other Clinician: Referring Vermelle Cammarata: Treating Natahsa Marian/Extender: [name] in Treatment:  106 Vital Signs Time Taken: 10:14 Temperature (F): 97.7 Height (in): 74 Pulse (bpm): 70 Weight (lbs): 238 Respiratory Rate (breaths/min): 16 Body Mass Index (BMI): 30.6 Blood Pressure (mmHg): 158/90 Reference Range: 80 - 120 mg / dl Electronic Signature(s) Signed: 04/25/2023 11:43:12 AM By: [name] Entered By: [name] on 04/25/2023 10:15:01  [id number] Date of Birth/Sex: Treating RN: 06-29-1950 (72 y.o. [name] Primary Care Shanequia Kendrick: [name] Other Clinician: Referring Mariateresa Batra: Treating Maxwell Martorano/Extender: [name] in Treatment: 106 Multidisciplinary Care Plan reviewed with physician [address] AMARR, PLINE ([id number]) [id number] Page 5 of 9 Venous Leg Ulcer Nursing Diagnoses: Knowledge deficit related to disease process and management Potential for venous Insuffiency (use before diagnosis confirmed) Goals: Patient will maintain  optimal edema control Date Initiated: 07/27/2021 Target Resolution Date: 04/26/2023 Goal Status: Active Interventions: Assess peripheral edema status every visit. Treatment Activities: Therapeutic compression applied : 07/27/2021 Notes: Wound/Skin Impairment Nursing Diagnoses: Impaired tissue integrity Knowledge deficit related to ulceration/compromised skin integrity Goals: Patient will have a decrease in wound volume by X% from date: (specify in notes) Date Initiated: 04/12/2021 Date Inactivated: 01/04/2022 Target Resolution Date: 04/23/2021 Goal Status: Met Patient/caregiver will verbalize understanding of skin care regimen Date Initiated: 01/04/2022 Target Resolution Date: 04/26/2023 Goal Status: Active Ulcer/skin breakdown will have a volume reduction of 30% by week 4 Date Initiated: 04/12/2021 Date Inactivated: 04/27/2021 Target Resolution Date: 04/27/2021 Goal Status: Unmet Unmet Reason: infection Ulcer/skin breakdown will have a volume reduction of 50% by week 8 Date Initiated: 04/27/2021 Date Inactivated: 06/29/2021 Target Resolution Date: 06/24/2021 Goal Status: Met Interventions: Assess patient/caregiver ability to obtain necessary supplies Assess patient/caregiver ability to perform ulcer/skin care regimen upon admission and as needed Assess ulceration(s) every visit Notes: Electronic Signature(s) Signed: 04/25/2023 11:43:12 AM By: [name] Entered By: [name] on 04/25/2023 10:16:53 -------------------------------------------------------------------------------- Pain Assessment Details Patient Name: Date of Service: [name] Grapes. 04/25/2023 10:00 A M Medical Record Number: [id number] Patient Account Number: [id number] Date of Birth/Sex: Treating RN: 01/14/1951 (72 y.o. [name] Primary Care Lahari Suttles: [name] Other Clinician: Referring Wiliam Cauthorn: Treating Oryn Casanova/Extender: [name] in Treatment:  106 Active Problems Location of Pain Severity and Description of Pain Patient Has Paino No Site Locations Rate the pain. TYQUISE, LEBARON ([id number]) [id number] Page 6 of 9 Rate the pain. Current Pain Level: 0 Pain Management and Medication Current Pain Management: Electronic Signature(s) Signed: 04/25/2023 11:43:12 AM By: [name] Entered By: [name] on 04/25/2023 10:15:25 -------------------------------------------------------------------------------- Patient/Caregiver Education Details Patient Name: Date of Service: [place], [name] 10/31/2024andnbsp10:00 A M Medical Record Number: [id number] Patient Account Number: [id number] Date of Birth/Gender: Treating RN: 1951-01-12 (72 y.o. [name] Primary Care Physician: [name] Other Clinician: Referring Physician: Treating Physician/Extender: [name] in Treatment: 106 Education Assessment Education Provided To: Patient Education Topics Provided Wound/Skin Impairment: Methods: Explain/Verbal Responses: Reinforcements needed, State content correctly Electronic Signature(s) Signed: 04/25/2023 11:43:12 AM By: [name] Entered By: [name] on 04/25/2023 10:17:08 -------------------------------------------------------------------------------- Wound Assessment Details Patient Name: Date of Service: [name] Grapes. 04/25/2023 10:00 A M Medical Record Number: [id number] Patient Account Number: [id number] Date of Birth/Sex: Treating RN: 1950/07/14 (72 y.o. [name] Primary Care Haven Foss: [name] Other Clinician: Referring Cleotis Sparr: Treating Sani Madariaga/Extender: Nigil, Barile ([id number]) [id number] Page 7 of 9 Weeks in Treatment: 106 Wound Status Wound Number: [id number] Primary Diabetic Wound/Ulcer of the Lower Extremity Etiology: Wound Location: Left,  Plantar Metatarsal head first Wound Open Wounding Event: Gradually Appeared Status: Date Acquired: 08/23/2020 Comorbid Glaucoma, Sleep Apnea, Hypertension, Peripheral Arterial Disease, Weeks Of Treatment: 106 History: Peripheral Venous Disease, Type II Diabetes, Gout, Osteoarthritis, Clustered Wound: No Neuropathy Photos Wound Measurements Length: (cm) 1.3 Width: (cm) 2.2 Depth: (cm) 0.1 Area: (cm) 2.246 Volume: (cm) 0.225 %  [name], [name] ([id number]) [id number] Page 1 of 9 Visit Report for 04/25/2023 Arrival Information Details Patient Name: Date of Service: [name], [name] 04/25/2023 10:00 A M Medical Record Number: [id number] Patient Account Number: [id number] Date of Birth/Sex: Treating RN: April 19, 1951 (72 y.o. [name] Primary Care Dorwin Fitzhenry: [name] Other Clinician: Referring Conni Knighton: Treating Khrystyne Arpin/Extender: [name] in Treatment: 106 Visit Information History Since Last Visit Added or deleted any medications: No Patient Arrived: Ambulatory Any new allergies or adverse reactions: No Arrival Time: 10:12 Had a fall or experienced change in No Accompanied By: self activities of daily living that may affect Transfer Assistance: None risk of falls: Patient Identification Verified: Yes Signs or symptoms of abuse/neglect since last visito No Secondary Verification Process Completed: Yes Hospitalized since last visit: No Patient Requires Transmission-Based Precautions: No Implantable device outside of the clinic excluding No Patient Has Alerts: Yes cellular tissue based products placed in the center since last visit: Has Dressing in Place as Prescribed: Yes Pain Present Now: No Electronic Signature(s) Signed: 04/25/2023 11:43:12 AM By: [name] Entered By: [name] on 04/25/2023 10:14:35 -------------------------------------------------------------------------------- Encounter Discharge Information Details Patient Name: Date of Service: [name] Grapes. 04/25/2023 10:00 A M Medical Record Number: [id number] Patient Account Number: [id number] Date of Birth/Sex: Treating RN: [birth year] (72 y.o. [name] Primary Care [place]: [name] Other Clinician: Referring Aroush Chasse: Treating Bonney Berres/Extender: [name] in Treatment: [phone] Encounter Discharge Information  Items Post Procedure Vitals Discharge Condition: Stable Temperature (F): 97.7 Ambulatory Status: Ambulatory Pulse (bpm): 70 Discharge Destination: Home Respiratory Rate (breaths/min): 16 Transportation: Private Auto Blood Pressure (mmHg): 158/90 Accompanied By: self Schedule Follow-up Appointment: Yes Clinical Summary of Care: Patient Declined Electronic Signature(s) Signed: 04/25/2023 11:43:12 AM By: [name] Entered By: [name] on 04/25/2023 10:46:54 [name] ([id number]) [id number] Page 2 of 9 -------------------------------------------------------------------------------- Lower Extremity Assessment Details Patient Name: Date of Service: [name], [name] 04/25/2023 10:00 A M Medical Record Number: [id number] Patient Account Number: [id number] Date of Birth/Sex: Treating RN: [birth year] (72 y.o. [name] Primary Care Decari Duggar: [name] Other Clinician: Referring Zaelynn Fuchs: Treating Hyla Coard/Extender: [name] in Treatment: 106 Edema Assessment Assessed: [name]: No] [name]: No] Edema: [Left: Ye] [Right: s] Calf Left: Right: Point of Measurement: 41 cm From Medial Instep 47.5 cm Ankle Left: Right: Point of Measurement: 10 cm From Medial Instep 28 cm Vascular Assessment Pulses: Dorsalis Pedis Palpable: [Left:Yes] Extremity colors, hair growth, and conditions: Extremity Color: [Left:Hyperpigmented] Hair Growth on Extremity: [Left:No] Temperature of Extremity: [Left:Warm] Capillary Refill: [Left:< 3 seconds] Dependent Rubor: [Left:No No] Electronic Signature(s) Signed: 04/25/2023 11:43:12 AM By: [name] Entered By: [name] on 04/25/2023 10:20:12 -------------------------------------------------------------------------------- Multi Wound Chart Details Patient Name: Date of Service: [name] Grapes. 04/25/2023 10:00 A M Medical Record Number:  [id number] Patient Account Number: [id number] Date of Birth/Sex: Treating RN: 09-02-1950 (72 y.o. M) Primary Care [place]: [name] Other Clinician: Referring Aasim Restivo: Treating Abanoub Hanken/Extender: [name] in Treatment: 106 Vital Signs Height(in): 74 Pulse(bpm): 70 Weight(lbs): 238 Blood Pressure(mmHg): 158/90 Body Mass Index(BMI): 30.6 Temperature(F): 97.7 Respiratory Rate(breaths/min): 16 [18R:Photos:] [22R:No Photos] [N/A:N/A [id number] Page 3 of 9] Left, Plantar Metatarsal head first Left, Proximal, Lateral Lower Leg N/A Wound Location: Gradually Appeared Bump N/A Wounding Event: Diabetic Wound/Ulcer of the Lower Cyst N/A Primary Etiology: Extremity Glaucoma, Sleep Apnea, Glaucoma, Sleep Apnea, N/A Comorbid History: Hypertension, Peripheral Arterial Hypertension, Peripheral Arterial Disease, Peripheral Venous Disease, Disease, Peripheral  over primary dressing as directed. Secured With 61M Medipore H Soft Cloth Surgical T ape, 4 x 10 (in/yd) Discharge Instruction: Secure with tape as directed. Compression Wrap Compression Stockings Add-Ons Electronic Signature(s) Signed: 04/25/2023 11:43:12 AM By: [name] Entered By: [name] on 04/25/2023 10:23:23 -------------------------------------------------------------------------------- Vitals Details Patient Name: Date of Service: [name] Grapes. 04/25/2023 10:00 A M Medical Record Number: [id number] Patient Account Number: [id number] Date of Birth/Sex: Treating RN: 1950/12/23 (72 y.o. [name] Primary Care Andalyn Heckstall: [name] Other Clinician: Referring Vermelle Cammarata: Treating Natahsa Marian/Extender: [name] in Treatment:  106 Vital Signs Time Taken: 10:14 Temperature (F): 97.7 Height (in): 74 Pulse (bpm): 70 Weight (lbs): 238 Respiratory Rate (breaths/min): 16 Body Mass Index (BMI): 30.6 Blood Pressure (mmHg): 158/90 Reference Range: 80 - 120 mg / dl Electronic Signature(s) Signed: 04/25/2023 11:43:12 AM By: [name] Entered By: [name] on 04/25/2023 10:15:01

## 2023-05-01 ENCOUNTER — Ambulatory Visit (HOSPITAL_COMMUNITY)
Admission: RE | Admit: 2023-05-01 | Discharge: 2023-05-01 | Disposition: A | Discharge status: Home / Self Care | Payer: Medicare Other | Source: Ambulatory Visit | Attending: Vascular Surgery | Admitting: Vascular Surgery

## 2023-05-01 ENCOUNTER — Ambulatory Visit (INDEPENDENT_AMBULATORY_CARE_PROVIDER_SITE_OTHER)
Admission: RE | Admit: 2023-05-01 | Discharge: 2023-05-01 | Disposition: A | Discharge status: Home / Self Care | Payer: Medicare Other | Source: Ambulatory Visit | Attending: Nurse Practitioner | Admitting: Nurse Practitioner

## 2023-05-01 ENCOUNTER — Ambulatory Visit (INDEPENDENT_AMBULATORY_CARE_PROVIDER_SITE_OTHER): Payer: Medicare Other | Admitting: Physician Assistant

## 2023-05-01 VITALS — BP 160/83 | HR 56 | Temp 97.2°F | Ht 73.0 in | Wt 276.9 lb

## 2023-05-01 DIAGNOSIS — S81802D Unspecified open wound, left lower leg, subsequent encounter: Secondary | ICD-10-CM

## 2023-05-01 DIAGNOSIS — I739 Peripheral vascular disease, unspecified: Secondary | ICD-10-CM

## 2023-05-01 DIAGNOSIS — I70229 Atherosclerosis of native arteries of extremities with rest pain, unspecified extremity: Secondary | ICD-10-CM

## 2023-05-01 LAB — VAS US ABI WITH/WO TBI
Left ABI: 1.07
Right ABI: 1.1
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
## 2023-05-02 ENCOUNTER — Encounter (HOSPITAL_BASED_OUTPATIENT_CLINIC_OR_DEPARTMENT_OTHER): Payer: Medicare Other | Attending: General Surgery | Admitting: General Surgery

## 2023-05-02 DIAGNOSIS — L97528 Non-pressure chronic ulcer of other part of left foot with other specified severity: Secondary | ICD-10-CM | POA: Insufficient documentation

## 2023-05-02 DIAGNOSIS — I87332 Chronic venous hypertension (idiopathic) with ulcer and inflammation of left lower extremity: Secondary | ICD-10-CM | POA: Insufficient documentation

## 2023-05-02 DIAGNOSIS — Z87891 Personal history of nicotine dependence: Secondary | ICD-10-CM | POA: Diagnosis not present

## 2023-05-02 DIAGNOSIS — Z794 Long term (current) use of insulin: Secondary | ICD-10-CM | POA: Insufficient documentation

## 2023-05-02 DIAGNOSIS — E11621 Type 2 diabetes mellitus with foot ulcer: Secondary | ICD-10-CM | POA: Diagnosis not present

## 2023-05-02 DIAGNOSIS — N183 Chronic kidney disease, stage 3 unspecified: Secondary | ICD-10-CM | POA: Insufficient documentation

## 2023-05-02 DIAGNOSIS — L97128 Non-pressure chronic ulcer of left thigh with other specified severity: Secondary | ICD-10-CM | POA: Diagnosis not present

## 2023-05-02 DIAGNOSIS — L97828 Non-pressure chronic ulcer of other part of left lower leg with other specified severity: Secondary | ICD-10-CM | POA: Diagnosis not present

## 2023-05-02 DIAGNOSIS — E11622 Type 2 diabetes mellitus with other skin ulcer: Secondary | ICD-10-CM | POA: Insufficient documentation

## 2023-05-02 DIAGNOSIS — I129 Hypertensive chronic kidney disease with stage 1 through stage 4 chronic kidney disease, or unspecified chronic kidney disease: Secondary | ICD-10-CM | POA: Diagnosis not present

## 2023-05-02 DIAGNOSIS — L859 Epidermal thickening, unspecified: Secondary | ICD-10-CM | POA: Diagnosis not present

## 2023-05-02 DIAGNOSIS — I890 Lymphedema, not elsewhere classified: Secondary | ICD-10-CM | POA: Diagnosis not present

## 2023-05-02 DIAGNOSIS — E1122 Type 2 diabetes mellitus with diabetic chronic kidney disease: Secondary | ICD-10-CM | POA: Diagnosis not present

## 2023-05-02 DIAGNOSIS — E1151 Type 2 diabetes mellitus with diabetic peripheral angiopathy without gangrene: Secondary | ICD-10-CM | POA: Insufficient documentation

## 2023-05-02 NOTE — Progress Notes (Signed)
[name], [name] ([id number]) [id number] Page 1 of 25 Visit Report for 05/02/2023 Chief Complaint Document Details Patient Name: Date of Service: [name] 05/02/2023 10:00 A M Medical Record Number: [id number] Patient Account Number: [id number] Date of Birth/Sex: Treating RN: 09/05/1950 (72 y.o. M) Primary Care Provider: [name] Other Clinician: Referring Provider: Treating Provider/Extender: [name] in Treatment: 107 Information Obtained from: Patient Chief Complaint Left leg and foot ulcers 04/12/2021; patient is here for wounds on his left lower leg and left plantar foot over the first metatarsal head Electronic Signature(s) Signed: 05/02/2023 10:49:53 AM By: [name] MD FACS Entered By: [name] on 05/02/2023 07:49:53 -------------------------------------------------------------------------------- Debridement Details Patient Name: Date of Service: [name] Grapes. 05/02/2023 10:00 A M Medical Record Number: [id number] Patient Account Number: [id number] Date of Birth/Sex: Treating RN: 12-Feb-1951 (72 y.o. [name] Primary Care Provider: [name] Other Clinician: Referring Provider: Treating Provider/Extender: [name] in Treatment: 107 Debridement Performed for Assessment: Wound #37 Left,Medial Foot Performed By: Physician [name], MD The following information was scribed by: [name] The information was scribed for: [name] Debridement Type: Debridement Severity of Tissue Pre Debridement: Fat layer exposed Level of Consciousness (Pre-procedure): Awake and Alert Pre-procedure Verification/Time Out Yes - 10:31 Taken: Start Time: 10:31 Pain Control: Lidocaine 4% T opical Solution Percent of Wound Bed Debrided: 100% T Area Debrided (cm): otal 0.31 Tissue and other material debrided: Non-Viable, Slough, Subcutaneous,  Slough Level: Skin/Subcutaneous Tissue Debridement Description: Excisional Instrument: Curette Bleeding: Minimum Hemostasis Achieved: Pressure Response to Treatment: Procedure was tolerated well Level of Consciousness (Post- Awake and Alert procedure): Post Debridement Measurements of Total Wound Length: (cm) 0.5 Width: (cm) 0.8 Depth: (cm) 0.1 Volume: (cm) 0.031 Character of Wound/Ulcer Post Debridement: Improved [name] ([id number]) [id number] Page 2 of 25 Severity of Tissue Post Debridement: Fat layer exposed Post Procedure Diagnosis Same as Pre-procedure Electronic Signature(s) Signed: 05/02/2023 2:17:41 PM By: [name] MD FACS Signed: 05/02/2023 3:21:42 PM By: [name] Entered By: [name] on 05/02/2023 07:31:36 -------------------------------------------------------------------------------- Debridement Details Patient Name: Date of Service: [name] Grapes. 05/02/2023 10:00 A M Medical Record Number: [id number] Patient Account Number: [id number] Date of Birth/Sex: Treating RN: 08-27-1950 (72 y.o. [name] Primary Care Provider: [name] Other Clinician: Referring Provider: Treating Provider/Extender: [name] in Treatment: 107 Debridement Performed for Assessment: Wound #18R Left,Plantar Metatarsal head first Performed By: Physician [name], MD The following information was scribed by: [name] The information was scribed for: [name] Debridement Type: Debridement Severity of Tissue Pre Debridement: Fat layer exposed Level of Consciousness (Pre-procedure): Awake and Alert Pre-procedure Verification/Time Out Yes - 10:31 Taken: Start Time: 10:31 Pain Control: Lidocaine 4% Topical Solution Percent of Wound Bed Debrided: 100% T Area Debrided (cm): otal 1.57 Tissue and other material debrided: Non-Viable, Callus, Slough, Subcutaneous,  Slough Level: Skin/Subcutaneous Tissue Debridement Description: Excisional Instrument: Curette Bleeding: Minimum Hemostasis Achieved: Pressure Response to Treatment: Procedure was tolerated well Level of Consciousness (Post- Awake and Alert procedure): Post Debridement Measurements of Total Wound Length: (cm) 1 Width: (cm) 2 Depth: (cm) 0.1 Volume: (cm) 0.157 Character of Wound/Ulcer Post Debridement: Improved Severity of Tissue Post Debridement: Fat layer exposed Post Procedure Diagnosis Same as Pre-procedure Electronic Signature(s) Signed: 05/02/2023 2:17:41 PM By: [name] MD FACS Signed: 05/02/2023 3:21:42 PM By: [name] Entered By: [name] on 05/02/2023 07:34:49 [name] ([id number]) [id number] Page 3 of 25 -------------------------------------------------------------------------------- Debridement Details  Patient Name: Date of Service: [name], [name] 05/02/2023 10:00 A M Medical Record Number: [id number] Patient Account Number: [id number] Date of Birth/Sex: Treating RN: January 28, 1951 (72 y.o. [name] Primary Care Provider: [name] Other Clinician: Referring Provider: Treating Provider/Extender: [name] in Treatment: 107 Debridement Performed for Assessment: Wound #36 Left,Anterior Lower Leg Performed By: Physician [name], MD The following information was scribed by: [name] The information was scribed for: [name] Debridement Type: Debridement Severity of Tissue Pre Debridement: Fat layer exposed Level of Consciousness (Pre-procedure): Awake and Alert Pre-procedure Verification/Time Out Yes - 10:31 Taken: Start Time: 10:31 Pain Control: Lidocaine 4% T opical Solution Percent of Wound Bed Debrided: 100% T Area Debrided (cm): otal 0.42 Tissue and other material debrided: Non-Viable, Slough, Subcutaneous, Slough Level:  Skin/Subcutaneous Tissue Debridement Description: Excisional Instrument: Curette Bleeding: Minimum Hemostasis Achieved: Pressure Response to Treatment: Procedure was tolerated well Level of Consciousness (Post- Awake and Alert procedure): Post Debridement Measurements of Total Wound Length: (cm) 0.6 Width: (cm) 0.9 Depth: (cm) 0.1 Volume: (cm) 0.042 Character of Wound/Ulcer Post Debridement: Improved Severity of Tissue Post Debridement: Fat layer exposed Post Procedure Diagnosis Same as Pre-procedure Electronic Signature(s) Signed: 05/02/2023 2:17:41 PM By: [name] MD FACS Signed: 05/02/2023 3:21:42 PM By: [name] Entered By: [name] on 05/02/2023 07:35:22 -------------------------------------------------------------------------------- Debridement Details Patient Name: Date of Service: [name] Grapes. 05/02/2023 10:00 A M Medical Record Number: [id number] Patient Account Number: [id number] Date of Birth/Sex: Treating RN: 08-Jul-1950 (72 y.o. [name] Primary Care Provider: [name] Other Clinician: Referring Provider: Treating Provider/Extender: [name] in Treatment: 107 Debridement Performed for Assessment: Wound #22R Left,Proximal,Lateral Lower Leg Performed By: Physician [name], MD The following information was scribed by: [name] The information was scribed for: [name] Debridement Type: Debridement Level of Consciousness (Pre-procedure): Awake and Alert Pre-procedure Verification/Time Out Yes - 10:31 Taken: Start Time: 10:31 Pain Control: Lidocaine 4% Topical Solution Percent of Wound Bed Debrided: 100% T Area Debrided (cm): otal 0.66 [name], [name] ([id number]) [id number] Page 4 of 25 Tissue and other material debrided: Non-Viable, Eschar, Slough, Subcutaneous, Slough Level: Skin/Subcutaneous Tissue Debridement Description:  Excisional Instrument: Curette Bleeding: Minimum Hemostasis Achieved: Pressure Response to Treatment: Procedure was tolerated well Level of Consciousness (Post- Awake and Alert procedure): Post Debridement Measurements of Total Wound Length: (cm) 1.2 Width: (cm) 0.7 Depth: (cm) 0.1 Volume: (cm) 0.066 Character of Wound/Ulcer Post Debridement: Improved Post Procedure Diagnosis Same as Pre-procedure Electronic Signature(s) Signed: 05/02/2023 2:17:41 PM By: [name] MD FACS Signed: 05/02/2023 3:21:42 PM By: [name] Entered By: [name] on 05/02/2023 07:36:10 -------------------------------------------------------------------------------- HPI Details Patient Name: Date of Service: [name] Grapes. 05/02/2023 10:00 A M Medical Record Number: [id number] Patient Account Number: [id number] Date of Birth/Sex: Treating RN: 1950-09-28 (72 y.o. M) Primary Care Provider: [name] Other Clinician: Referring Provider: Treating Provider/Extender: [name] in Treatment: 107 History of Present Illness HPI Description: 10/11/17; Mr. Lutze is a 72 year old man who tells me that in 2015 he slipped down the latter traumatizing his left leg. He developed a wound in the same spot the area that we are currently looking at. He states this closed over for the most part although he always felt it was somewhat unstable. In 2016 he hit the same area with the door of his car had this reopened. He tells me that this is never really closed although sometimes an inflow it remains open on  a constant basis. He has not been using any specific dressing to this except for topical antibiotics the nature of which were not really sure. His primary doctor did send him to see Dr. Jacinto Halim of interventional cardiology. He underwent an angiogram on 08/06/17 and he underwent a PTA and directional atherectomy of the lesser distal SFA and popliteal arteries which resulted in  brisk improvement in blood flow. It was noted that he had 2 vessel runoff through the anterior tibial and peroneal. He is also been to see vascular and interventional radiologist. He was not felt to have any significant superficial venous insufficiency. Presumably is not a candidate for any ablation. It was suggested he come here for wound care. The patient is a type II diabetic on insulin. He also has a history of venous insufficiency. ABIs on the left were noncompressible in our clinic 10/21/17; patient we admitted to the clinic last week. 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 We put Iodosorb on him after an aggressive debridement and 3 layer compression. He complained of pain in his ankle and itching with is skin in fact he scratched the area on the medial calf superiorly at the rim of our wraps and he has 2 small open areas in that location today which are new. I changed his primary dressing today to silver collagen. As noted he is already had revascularization and does not have any significant superficial venous insufficiency that would be amenable to ablation 10/28/17; patient admitted to the clinic 2 weeks ago. He has a smaller Wound. Scratch injury from last week revealed. There is large wound over the tibial area. This is smaller. Granulation looks healthy. No need for debridement. 11/04/17; the wound on the left lateral calf looks better. Improved dimensions. Surface of this looks better. We've been maintaining him and Kerlix Coban wraps. He finds this much [name] comfortable. Silver collagen dressing 11/11/17; left lateral Wound continues to look healthy be making progress. Using a #5 curet I removed removed nonviable skin from the surface of the wound and then necrotic debris from the wound surface. Surface of the wound continues to look healthy. He also has an open area on the left great toenail bed. We've been using topical  antibiotics. 11/19/17; left anterior lateral wound continues to look healthy but it's not closed. He also had a small wound above this on the left leg Initially traumatic wounds in the setting of significant chronic venous insufficiency and stasis dermatitis 11/25/17; left anterior wounds superiorly is closed still a small wound inferiorly. 12/02/17; left anterior tibial area. Arrives today with adherent callus. Post debridement clearly not completely closed. Hydrofera Blue under 3 layer compression. 12/09/17; left anterior tibia. Circumferential eschar however the wound bed looks stable to improved. We've been using Hydrofera Blue under 3 layer compression 12/17/17; left anterior tibia. Apparently this was felt to be closed however when the wrap was taken off there is a skin tear to reopen wounds in the same area we've been using Hydrofera Blue under 3 layer compression [name], [name] ([id number]) [id number] Page 5 of 25 12/23/17 left anterior tibia. Not close to close this week apparently the [facility] was stuck to this again. Still circumferential eschar requiring debridement. I put a contact layer on this this time under the Hydrofera Blue 12/31/17; left anterior tibia. Wound is better slight amount of hyper-granulation. Using Hydrofera Blue over Adaptic. 01/07/18; left anterior tibia. The wound had some  surface eschar however after this was removed he has no open wound.he was already revascularized by Dr. Jacinto Halim when he came to our clinic with atherectomy of the left SFA and popliteal artery. He was also sent to interventional radiology for venous reflux studies. He was not felt to have significant reflux but certainly has chronic venous changes of his skin with hemosiderin deposition around this area. He will definitely need to lubricate his skin and wear compression stocking and I've talked to him about this. READMISSION 05/26/2018 This is a now 72 year old man we cared  for with traumatic wounds on his left anterior lower extremity. He had been previously revascularized during that admission by Dr. Jacinto Halim. Apparently in follow-up Dr. Jacinto Halim noted that he had deterioration in his arterial status. He underwent a stent placement in the distal left SFA on 04/22/2018. 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 He went back to the angiography suite on 04/30/2018 he underwent PTA and balloon angioplasty of the occluded left mid anterior tibial artery, thrombotic occlusion went from 100 to 0% which reconstitutes the posterior tibial artery. He had thrombectomy and aspiration of the peroneal artery. The stent placed in the distal SFA left SFA was still occluded. 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 He has been recuperating at [facility] in [facility] after the most recent hospitalization. He is going home tomorrow. He tells me that 2 weeks ago he traumatized the tip of his left fifth toe. He came in urgently for our review of this. This was a history of before I noted that Dr. Jacinto Halim had already noted dry gangrenous changes of the left fifth toe 06/09/2018; 2-week follow-up. I did contact Dr. Jacinto Halim after his last appointment and he apparently saw 1 of Dr. Verl Dicker colleagues the next day. He does not follow-up with Dr. Jacinto Halim himself until Thursday of this week. 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 Nevertheless there is no evidence of infection no drainage and no pain. He had a new area that this week when we were signing him in today on the left anterior mid tibia area, this is in close proximity to the previous wound we have dealt with in this clinic. 06/23/2018; 2-week  follow-up. I did not receive a recent note from Dr. Jacinto Halim to review today. Our office is trying to obtain this. He is apparently not planning to do further vascular interventions and wondered about compression to try and help with the patient's chronic venous insufficiency. However we are also concerned about the arterial flow. He arrives in clinic today with a new area on the left third toe. The areas on the calf/anterior tibia are close to closing. The left fifth toe is still mummified using Betadine. -In reviewing things with the patient he has what sounds like claudication with mild to moderate amount of activity. 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 I prescribed Levaquin over the phone while we attempted to arrange a plan of care. However the patient called yesterday to report he had low-grade fever and he came in today acutely. There is been a marked deterioration in the left third toe with spreading cellulitis up into the dorsal  left foot. He was referred to the emergency room. Readmission: 06/29/2020 patient presents today for reevaluation here in our clinic he was previously treated by Dr. Leanord Hawking at the latter part of 2019 in 2 the beginning of 2020. Subsequently we have not seen him since that time in the interim he did have evaluation with vein and vascular specialist specifically Dr. Bo Mcclintock who did perform quite extensive work for a left femoral to anterior tibial artery bypass. With that being said in the interim the patient has developed significant lymphedema and has wounds that he tells me have really never healed in regard to the incision site on the left leg. He also has multiple wounds on the feet for various reasons some of which is that he tends to pick at his feet. Fortunately there is no signs of active infection systemically at this time he does have some wounds that are little bit deeper but most are fairly superficial he seems to have  good blood flow and overall everything appears to be healthy I see no bone exposed and no obvious signs of osteomyelitis. I do not know that he necessarily needs a x-ray at this point although that something we could consider depending on how things progress. The patient does have a history of lymphedema, diabetes, this is type II, chronic kidney disease stage III, hypertension, and history of peripheral vascular disease. 07/05/2020; patient admitted last week. Is a patient I remember from 2019 he had a spreading infection involving the left foot and we sent him to the hospital. 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 He subsequently had a left femoral to anterior tibial bypass by Dr.Cain vein and vascular. He also has severe lymphedema with chronic skin changes related to that on the left leg. The most problematic area that was new today was on the left medial great toe. This was apparently a small area last week there was purulent drainage which our intake nurse cultured. Also areas on the left medial foot and heel left lateral foot. He has 2 areas on the left medial calf left lateral calf in the setting of the severe lymphedema. 07/13/2020 on evaluation today patient appears to be doing better in my opinion compared to his last visit. The good news is there is no signs of active infection systemically and locally I do not see any signs of infection either. He did have an x-ray which was negative that is great news he had a culture which showed MRSA but at the same time he is been on the doxycycline which has helped. I do think we may want to extend this for 7 additional days 1/25; patient admitted to the clinic a few weeks ago. 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 We have been putting him under compression his edema control is a lot better but he is severe verricused skin on the left leg. He is really done  quite well he still has an open area on the left medial calf and the left medial first metatarsal head. We have been using silver collagen on the leg silver alginate on the foot 07/27/2020 upon evaluation today patient appears to be doing decently well in regard to his wounds. He still has a lot of dry skin on the left leg. Some of this is starting to peel back and I think he may be able to have them out by removing some  that today. Fortunately there is no signs of active infection at this time on the left leg although on the right leg he does appear to have swelling and erythema as well as some mild warmth to touch. This does have been concerned about the possibility of cellulitis although within the differential diagnosis I do think that potentially a DVT has to be at least considered. We need to rule that out before proceeding would just call in the cellulitis. Especially since he is having pain in the posterior aspect of his calf muscle. 2/8; the patient had seen sparingly. He has severe skin changes of chronic lymphedema in the left leg thickened hyperkeratotic verrucous skin. He has an open wound on the medial part of the left first met head left mid tibia. He also has a rim of nonepithelialized skin in the anterior mid tibia. He brought in the AmLactin lotion that was been prescribed although I am not sure under compression and its utility. There concern about cellulitis on the right lower leg the last time he was here. He was put on on antibiotics. His DVT rule out was negative. The right leg looks fine he is using his stocking on this area 08/10/2020 upon evaluation today patient appears to be doing well with regard to his leg currently. He has been tolerating the dressing changes without complication. Fortunately there is no signs of active infection which is great news. Overall very pleased with where things stand. 2/22; the patient still has an area on the medial part of the left first met his  head. This looks better than when I last saw this earlier this month he has a rim of epithelialization but still some surface debris. Mostly everything on the left leg is healed. There is still a vulnerable in the left mid tibia area. 08/30/2020 upon evaluation today patient appears to be doing much better in regard to his wounds on his foot. 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 Nonetheless I think we will address that today I Minna send in a prescription for him in that regard. Overall though there does not appear to be any signs of significant worsening. 09/07/2020 on evaluation today patient's wounds over his left foot appear to be doing excellent. I do not see any signs of infection there is some callus buildup this can require debridement for certain but overall I feel like he is managing quite nicely. He still using the AmLactin cream which has been beneficial for him as well. 3/22; left foot wound is closed. There is no open area here. He is using ammonium lactate lotion to the lower extremities to help exfoliate dry cracked skin. He has compression stockings from [facility] in [place]. The wound on the medial part of his left first met head is healed today. READMISSION 04/12/2021 [name] ([id number]) [id number] Page 6 of 25 Mr. Quizon is a patient we know fairly well he had a prolonged stay in clinic in 2019 with wounds on his left lateral and left anterior lower extremity in the setting of chronic venous insufficiency. [name] recently he was here earlier this year with predominantly an area on his left foot first metatarsal head plantar and he says the plantar foot broke down on its not long after we discharged him but he did not come back here. The last few months areas of  broken down on his left anterior and again  the left lateral lower extremity. The leg itself is very swollen chronically enlarged a lot of hyperkeratotic dry Berry Q skin in the left lower leg. His edema extends well into the thigh. He was seen by Dr. Randie Heinz. He had ABIs on 03/02/2021 showing an ABI on the right of 1 with a TBI of 0.72 his ABI in the left at 1.09 TBI of 0.99. Monophasic and biphasic waveforms on the right. On the left monophasic waveforms were noted he went on to have an angiogram on 03/27/2021 this showed the aortic aortic and iliac segments were free of flow-limiting stenosis the left common femoral vein to evaluate the left femoral to anterior tibial artery bypass was unobstructed the bypass was patent without any areas of stenosis. We discharged the patient in bilateral juxta lite stockings but very clearly that was not sufficient to control the swelling and maintain skin integrity. He is clearly going to need compression pumps. 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 This is fortunate because he is on his feet for long periods of time. 10/27; patient comes in with our intake nurse reporting copious amount of green drainage from the left anterior mid tibia the left dorsal foot and to a lesser extent the left medial mid tibia. We left the compression wrap on all week for the amount of edema in his left leg is quite a bit better. We use silver alginate as the primary dressing 11/3; edema control is good. Left anterior lower leg left medial lower leg and the plantar first metatarsal head. The left anterior lower leg required debridement. Deep tissue culture I did of this wound showed MRSA I put him on 10 days of doxycycline which she will start today. We have him in compression wraps. He has a security card and [employer] however he is retiring on November 15. We will need to then get him into a better offloading boot for the left foot perhaps a total contact cast 11/10; edema  control is quite good. Left anterior and left medial lower leg wounds in the setting of chronic venous insufficiency and lymphedema. He also has a substantial area over the left plantar first metatarsal head. 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 He has significant hypergranulation on the left plantar foot wound. The patient is a diabetic but he does not have significant PAD 11/17; edema control is quite good. Left anterior and left medial lower leg wounds look better. The really concerning area remains the area on the left plantar first metatarsal head. He has a rim of epithelialization. 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 Starting today with a forefoot off loader but . possibly a total contact cast. 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 Arrives with a very significant odor from I think all of the wounds on the left leg and the left foot. Because of this I did not put a total contact cast on him today but will could still consider this. His wife was having cataract surgery  which is the reason he missed the appointment 12/6. I saw this man 5 days ago with a swelling below the popliteal fossa. I thought he actually might have a Baker's cyst however the DVT rule out study that we could arrange right away was negative the technician told me this was not a ruptured Baker's cyst. We attempted to get this aspirated by under ultrasound guidance in interventional radiology however all they did was an ultrasound however it shows an extensive fluid collection 62 x 8 x 9.4 in the left thigh and left calf. The patient states he thinks this started 8 days ago or so but he really is not  complaining of any pain, fever or systemic symptoms. He has not ha 12/20; after some difficulty I managed to get the patient into see Dr. Randie Heinz. Eventually he was taken into the hospital and had a drain put in the fluid collection below his left knee posteriorly extending into the posterior thigh. He still has the drain in place. Culture of this showed moderate staff aureus few Morganella and few Klebsiella he is now on doxycycline and ciprofloxacin as suggested by infectious disease he is on this for a month. The drain will remain in place until it stops draining 12/29; he comes in today with the 1 wound on his left leg and the area on the left plantar first met head significantly smaller. Both look healthy. He still has the drain in the left leg. He says he has to change this daily. Follows up with Dr. Randie Heinz on January 11. 06/29/2021; the wounds that I am following on the left leg and left first met head continued to be quite healthy. However the area where his inferior drain is in place had copious amounts of drainage which was green in color. The wound here is larger. Follows up with Dr. Pascal Lux of vein and vascular his surgeon next week as well as infectious disease. He remains on ciprofloxacin and doxycycline. He is not complaining of excessive pain in either one of the drain areas 1/12; the patient saw vascular surgery and infectious disease. Vascular surgery has left the drain in place as there was still some notable drainage still see him back in 2 weeks. Dr. Dorthula Perfect stop the doxycycline and ciprofloxacin and I do not believe he follows up with them at this point. Culture I did last week showed both doxycycline resistant MRSA and Pseudomonas not sensitive to ciprofloxacin although only in rare titers 1/19; the patient's wound on the left anterior lower leg is just about healed. We have continued healing of the area that was medially on the left leg. Left first plantar metatarsal head continues to get  smaller. The major problem here is his 2 drain sites 1 on the left upper calf and lateral thigh. There is purulent drainage still from the left lateral thigh. I gave him antibiotics last week but we still have recultured. He has the drain in the area I think this is eventually going to have to come out. I suspect there will be a connecting wound to heal here perhaps with improved VAc 1/26; the patient had his drain removed by vein and vascular on 1/25/. This was a large pocket of fluid in his left thigh that seem to tunnel into his left upper calf. 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 His mention his Penrose drain was removed today. He now has a tunneling wound on his left calf and left thigh. Both of  these probe widely towards each other although I cannot really prove that they connect. Both wounds on his lower leg anteriorly are closed and his area over the first metatarsal head on his right foot continues to improve. We are using Hydrofera Blue here. He also saw infectious disease culture of the abscess they noted was polymicrobial with MRSA, Morganella and Klebsiella he was treated with doxycycline and ciprofloxacin for 4 weeks ending on 07/03/2021. They did not recommend any further antibiotics. Notable that while he still had the Penrose drain in place last week he had purulent drainage coming out of the inferior IandD site this grew [facility], MRSA and Pseudomonas but there does not appear to be any active infection in this area today with the drain out and he is not systemically unwell 2/2; with regards to the drain sites the superior one on the thigh actually is closed down the one on the upper left lateral calf measures about 8 and half centimeters which is an improvement seems to be less prominent although still with a lot of drainage. The only remaining wound is over the first metatarsal head on the left foot and this looks to be continuing to improve with  Hydrofera Blue. 2/9; the area on his plantar left foot continues to contract. Callus around the wound edge. The drain sites specifically have not come down in depth. We put the wound VAC on Monday he changed the canister late last night our intake nurse reported a pocket of fluid perhaps caused by our compression wraps 2/16; continued improvement in left foot plantar wound. drainage site in the calf is not improved in terms of depth (wound vac) 2/23; continued improvement in the left foot wound over the first metatarsal head. With regards to the drain sites the area on his thigh laterally is healed however the open area on his calf is small in terms of circumference by still probes in by about 15 cm. Within using the wound VAC. Hydrofera Blue on his foot 08/24/2021: The left first metatarsal head wound continues to improve. The wound bed is healthy with just some surrounding callus. Unfortunately the open drain site on his calf remains open and tunnels at least 15 cm (the extent of a Q-tip). This is despite several weeks of wound VAC treatment. Based on reading back [name], [name] ([id number]) [phone].pdf Page 7 of 25 through the notes, there has been really no significant change in the depth of the wound, although the orifice is smaller and the [name] cranial wound on his thigh has closed. I suspect the tunnel tracks nearly all the way to this location. 08/31/2021: Continued improvement in the left first metatarsal head wound. There has been absolutely no improvement to the long tunnel from his open drain site on his calf. We have tried to get him into see vascular surgery sooner to consider the possibility of simply filleting the tract open and allowing it to heal from the bottom up, likely with a wound VAC. They have not yet scheduled a sooner appointment than his current mid April 09/14/2021: He was seen by vascular surgery and they took him to the operating room last week.  They opened a portion of the tunnel, but did not extend the entire length of the known open subcutaneous tract. I read Dr. Darcella Cheshire operative note and it is not clear from that documentation why only a portion of the tract was opened. The heaped up granulation tissue was curetted and removed from at least some  portion of the tract. They did place a wound VAC and applied an Unna boot to the leg. The ulcer on his left first metatarsal head is smaller today. The bed looks good and there is just a small amount of surrounding callus. 09/21/2021: The ulcer on his left first metatarsal head looks to be stalled. There is some callus surrounding the wound but the wound bed itself does not appear particularly dynamic. The tunnel tract on his lateral left leg seems to be roughly the same length or perhaps slightly smaller but the wound bed appears healthy with good granulation tissue. He opened up a new wound on his medial thigh and the site of a prior surgical incision. He says that he did this unconsciously in his sleep by scratching. 09/28/2021: Unfortunately, the ulcer on his left first metatarsal head has extended underneath the callus toward the dorsum of his foot. The medial thigh wounds are roughly the same. The tunnel on his lateral left leg continues to be problematic; it is longer than we are able to actually probe with a Q-tip. I am still not certain as to why Dr. Randie Heinz did not open this up entirely when he took the patient to the operating room. We will likely be back in the same situation with just a small superficial opening in a long unhealed tract, as the open portion is granulating in nicely. 10/02/2021: The patient was initially scheduled for a nurse visit, but we are also applying a total contact cast today. The plantar foot wound looks clean without significant accumulated callus. We have been applying Prisma silver collagen to the site. 10/05/2021: The patient is here for his first total contact  cast change. We have tried using gauze packing strips in the tunnel on his lateral leg wound, but this does not seem to be working any better than the white VAC foam. The foot ulcer looks about the same with minimal periwound callus. Medial thigh wound is clean with just some overlying eschar. 10/12/2021: The plantar foot wound is stable without any significant accumulation of periwound callus. The surface is viable with good granulation tissue. The medial thigh wounds are much smaller and are epithelializing. On the other hand, he had purulent drainage coming from the tunnel on his lateral leg. He does go back to see Dr. Randie Heinz next week and is planning to ask him why the wound tunnel was not completely opened at the time of his most recent operation. 10/19/2021: The plantar foot wound is markedly improved and has epithelial tissue coming through the surface. The medial thigh wounds are nearly closed with just a tiny open area. He did see Dr. Randie Heinz earlier this week and apparently they did discuss the possibility of opening the sinus tract further and enabling a wound VAC application. Apparently there are some limits as to what Dr. 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 I think if we could get the tract open to the level of the popliteal fossa, this would greatly aid in her ability to get this chart closed. That being said, however, today when I probed the tract with a Q-tip, I was not able to insert the entirety of the Q-tip as I have on previous occasions. The tunnel is shorter by about 4 cm. The surface is clean with good granulation tissue and no further episodes of purulent drainage. 10/30/2021: Last week, the patient underwent surgery and had the long tract in his leg opened. There was a rind that  was debrided, according to the operative report. His medial thigh ulcers are closed. The plantar foot wound is clean with a good surface and some built up surrounding  callus. 11/06/2021: The overall dimensions of the large wound on his lateral leg remain about the same, but there is good granulation tissue present and the tunneling is a little bit shorter. He has a new wound on his anterior tibial surface, in the same location where he had a similar lesion in the past. The plantar foot wound is clean with some buildup surrounding callus. Just toward the medial aspect of his foot, however, there is an area of darkening that once debrided, revealed another opening in the skin surface. 11/13/2021: The anterior tibial surface wound is closed. The plantar foot wound has some surrounding callus buildup. The area of darkening that I debrided last week and revealed an opening in the skin surface has closed again. The tunnel in the large wound on his lateral leg has come in by about 3 cm. There is healthy granulation tissue on the entire wound surface. 11/23/2021: The patient was out of town last week and did wet-to-dry dressings on his large wound. 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 Unfortunately, he picked open the wound on his left medial thigh. He says that it was itching and he just could not stop scratching it until it was open again. The wound on his plantar foot is smaller and has not accumulated a tremendous amount of callus. The lateral leg wound is shallower and the tunnel has also decreased in depth. There is just a little bit of slough accumulation on the surface. 11/30/2021: Another portion of his left medial thigh has been opened up. All of these wounds are fairly superficial with just a little bit of slough and eschar accumulation. The wound on his plantar foot is almost closed with just a bit of eschar and periwound callus accumulation. The lateral leg wound is nearly flush with the surrounding skin and the tunnel is markedly shallower. 12/07/2021: There is just 1 open area on his left  medial thigh. It is clean with just a little bit of perimeter eschar. The wound on his plantar foot continues to contract and just has some eschar and periwound callus accumulation. The lateral leg wound is closing at the [name] distal aspect and the tunnel is smaller. The surface is nearly flush with the surrounding skin and it has a good bed of granulation tissue. 12/14/2021: The thigh and foot wounds are closed. The lateral leg wound has closed over approximately half of its length. The tunnel continues to contract and the surface is now flush with the surrounding skin. The wound bed has robust granulation tissue. 12/22/2021: The thigh and foot wounds have reopened. The foot wound has a lot of callus accumulation around and over it. The thigh wound is tiny with just a little bit of slough in the wound bed. The lateral leg wound continues to contract. His vascular surgeon took the wound VAC off earlier in the week and the patient has been doing wet-to-dry dressings. There is a little slough accumulation on the surface. The tunnel is about 3 cm in depth at this point. 12/28/2021: The thigh wound is closed again. The foot wound has some callus that subsequently has peeled back exposing just a small slit of a wound. The lateral leg wound Is down to about half the size that it originally  was and the tunnel is down to about half a centimeter in depth. 01/04/2022: The thigh wound remains closed. The foot wound has heavy callus overlying the wound site. Once this was debrided, the wound was found to be closed. The lateral leg wound is smaller again this week and very superficial. No tunnel could be identified. 01/12/2022: The thigh and foot wounds both remain closed. The lateral leg wound is now nearly flush with the skin surface. There is good granulation tissue present with a light layer of slough. 01/19/2022: Due to the way his wrap was placed, the patient did not change the dressing on his thigh at all and so  the foam was saturated and his skin is macerated. There is a light layer of slough on the wound surface. The underlying granulation tissue is robust and healthy-appearing. He has heavy callus buildup at the site of his first metatarsal head wound which is still healed. 02/01/2022: He has been in silver alginate. When he removed the dressing from his thigh wound, however, some leg, superficially reopening a portion of the wound that had healed. In addition, underneath the callus at his left first metatarsal head, there appears to be a blister and the wound appears to be open again. 02/08/2022: The lateral leg wound has contracted substantially. There is eschar and a light layer of slough present. He says that it is starting to pull and is uncomfortable. On inspection, there is some puckering of the scar and the eschar is quite dry; this may account for his symptoms. On his first metatarsal head, the wound is much smaller with just some eschar on the surface. The callus has not reaccumulated. He reports that he had a blister come up on his medial thigh wound at the distal aspect. It popped and there is now an opening in his skin again. Looking back through his library of wound photos, there is what [name], [name] ([id number]) [id number] Page 8 of 25 looks like a permanent suture just deep to this location and it may be trying to erode through. We have been using silver alginate on his wounds. 02/15/2022: The lateral leg wound is about half the size it was last week. It is clean with just a little perimeter eschar and light slough. The wound on his first metatarsal head is about the same with heavy callus overlying it. The medial thigh wound is closed again. He does have some skin changes on the top of his foot that looks potentially yeast related. 02/22/2022: The skin on the top of his foot improved with the use of a topical antifungal. The lateral leg wound continues to contract  and is again smaller this week. There is a little bit of slough and eschar on the surface. The first metatarsal head wound is a little bit smaller but has reaccumulated a thick callus over the top. He decided to try to trim his toenail and ultimately took the entire nail off of his left great toe. 03/02/2022: His lateral leg wound continues to improve, as does the wound on his left great toe. Unfortunately, it appears that somehow his foot got wet and moisture seeped in through the opening causing his skin to lift. There is a large wound now overlying his first metatarsal on both the plantar, medial, and dorsal portion of his foot. There is necrotic tissue and slough present underneath the shaggy macerated skin. 03/08/2022: The lateral leg wound is smaller again today. There is just a light layer of slough and  eschar on the surface. The great toe wound is smaller again today. The first metatarsal wound is a little bit smaller today and does not look nearly as necrotic and macerated. There is still slough and nonviable tissue present. 03/15/2022: The lateral leg wound is narrower and just has a little bit of light slough buildup. The first metatarsal wound still has a fair amount of moisture affecting the periwound skin. The great toe wound is healed. 03/22/2022: The lateral leg wound is now isolated to just at the level of his knee. There is some eschar and slough accumulation. The first metatarsal head wound has epithelialized tremendously and is about half the size that it was last week. He still has some maceration on the top of his foot and a fungal odor is present. 03/29/2022: T oday the patient's foot was macerated, suggesting that the cast got wet. The patient has also been picking at his dry skin and has enlarged the wound on his left lateral leg. In the time between having his cast removed and my evaluation, he had picked [name] dry skin and opened up additional wounds on his Achilles area and  dorsal foot. The plantar first metatarsal head wound, however, is smaller and clean with just macerated callus around the perimeter and light slough on the surface. The lateral leg wound measured a little bit larger but is also fairly clean with eschar and minimal slough. 04/02/2022: The patient had vascular studies done last Friday and so his cast was not applied. He is here today to have that done. 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 04/05/2022: Both wounds are smaller and quite clean. There is just a little biofilm on the lateral leg wound. 10/20; the patient has a wound on the left lateral surgical incision at the level of his lateral knee this looks clean and improved. He is using silver alginate. He also has an area on his left medial foot for which she is using Hydrofera Blue under a total contact cast both wounds are measuring smaller 04/20/2022: The plantar foot wound has contracted considerably and is very close to closing. The lateral leg wound was measured a little larger, but there was a tiny open area that was included in the measurements that was not included last week. He has some eschar around the perimeter but otherwise the wound looks clean. 04/27/2022: The lateral leg wound looks better this week. He says that midweek, he felt it was very dry and began applying hydrogel to the site. I think this was beneficial. The foot wound is nearly closed underneath a thick layer of dry skin and callus. 05/04/2022: The foot wound is healed. He has developed a new small ulcer on his anterior tibial surface about midway up his leg. It has a little slough on the surface. The lateral leg wound still is fairly dry, but clean with just a little biofilm on the surface. 05/11/2022: The wound on his foot reopened on Wednesday. A large blister formed which then broke open revealing the fat layer underneath. The ulcer on his anterior tibial surface is a little bit larger this week. The  lateral leg wound has much better moisture balance this week. Fortunately, prior to his foot wound reopening, he did get the cast made for his orthotic. 05/15/2022: Already, the left medial foot wound has improved. The tissue is less macerated and the surface is clean. The ulcer on his anterior tibial surface continues to enlarge. This seems likely secondary to accumulated  moisture. The lateral leg wound continues to have an improved moisture balance with the use of collagen. 05/25/2022: The medial foot wound continues to contract. It is now substantially smaller with just a little slough on the surface. The anterior tibial surface wound continues to enlarge further. Once again, this seems to be secondary to moisture. The lateral leg wound does not seem to be changing much in size, but the moisture balance is better. 06/01/2022: The anterior tibial wound is closed. The medial foot wound is down to just a very small, couple of millimeters, opening. The lateral leg wound has good moisture balance, but remains unchanged in size. 12/15; the patient's anterior tibial wound has reopened, however the area on his right first metatarsal head is closed. The major wound is actually on the superior part of his surgical wound in the left lateral thigh. Not a completely viable surface under illumination. This may at some point require a debridement I think he is currently using Prisma. As noted the left medial foot wound has closed 06/14/2022: The anterior tibial wound has closed. The lateral leg wound has a better surface but is basically unchanged in size. The left medial foot wound has reopened. It looks as though there was some callus accumulation and moisture got under the callus which caused the tissue to break down again. 06/21/2022: A new wound has opened up just distal to the previous anterior tibial wound. It is small but has hypertrophic granulation tissue present. The lateral leg wound is a little bit  narrower and has a layer of slough on the surface. The left medial foot wound is down to just a pinhole. His custom orthotics should be available next week. 06/28/2022: The wound on his first metatarsal head has healed. He has developed a new small wound on his medial lower leg, in an old scar site. The lateral leg wound continues to contract but continues to accumulate slough, as well. 07/03/2022: Despite wearing his custom orthopedic shoes, he managed to reopen the wound on his first metatarsal head. He says he thinks his foot got wet and then some skin lifted up and he peeled this away. Both of the lower leg wounds are smaller and have some dry eschar on the surface. The lateral leg wound is quite a bit narrower today. 07/12/2022: The medial lower leg wound is closed. The anterior lower leg wound has contracted considerably. The lateral upper leg wound is narrower with a layer of slough on the surface. The first metatarsal head wound is also smaller, but had copious drainage which saturated the foam border dressing and resulted in some periwound tissue maceration. Fortunately there was no breakdown at this site. 07/19/2022: The lower leg shows signs of significant maceration; I think he must be sweating excessively inside his cast. There are several areas of skin breakdown present. The wound on his foot is smaller and that on his lateral leg is narrower and is shorter by about a centimeter. 07/26/2022: Last week we used a zinc Coflex wrap prior to applying his total contact cast and this has had the effect of keeping his skin from getting macerated this week. The anterior leg wound has epithelialized substantially. The lateral leg wound is significantly smaller with just a bit of slough on the surface. The first metatarsal head wound is also smaller this week. [name], [name] ([id number]) [id number] Page 9 of 25 08/02/2022: The anterior leg wound was closed on arrival, but while  he was sitting in the room, he  picked it open again. The lateral leg wound is smaller with just a little slough on the surface and the first metatarsal head wound has contracted further, as well. 08/09/2022: The first metatarsal head wound is covered with callus. Underneath the callus, it is nearly completely closed. The lateral leg wound is smaller again this week. The anterior leg wound looks better, but he has such heavy buildup of old skin, that moisture is getting underneath this, becoming trapped, and causing the underlying skin to get macerated and open up. 08/16/2022: The first metatarsal head wound is closed. The lateral leg wound continues to contract and is quite a bit smaller again this week. There is just a small, superficial opening remaining on his anterior tibial surface. 08/23/2022: The first metatarsal head wound has, by some miracle, remained closed. The lateral leg wound is substantially smaller with multiple areas of epithelialization. The anterior tibial surface wound is also quite a bit smaller and very clean. 08/30/2022: Unfortunately, his first metatarsal head wound opened up again. It happened in the same fashion as it has on prior occasions. Moisture got under dried skin/callus and created a wound when he removed his sock, taking the skin with it. The anterior tibial surface has a thick shell of hyperkeratotic skin. This has been contributing to ongoing repeat wounding events as moisture gets underneath this and causes tissue breakdown. 3/15; patient presents for follow-up. His anterior left leg wound has healed. He still has the wound to the left lateral aspect and left first met head. We have been using silver alginate and endoform to these areas under [employer]. He has no issues or complaints today. He has been taking Augmentin and reports improvement to his symptoms to the left first met head. 09/13/2022: He has accumulated [name] thick dry skin in sheets on his lower leg. The  lateral leg wound is about the same size and the left first metatarsal head wound is a little bit smaller. There is slough on both surfaces. There is callus buildup around the foot wound. 09/20/2022: The lateral leg wound is a little bit narrower and the left first metatarsal head wound also seems to have contracted slightly. There is slough on both surfaces. He has a little skin breakdown on his anterior tibial surface. 09/27/2022: The lateral leg wound continues to contract and is quite clean. The first metatarsal head wound is also smaller. There is some perimeter callus and slough accumulation on the foot. The anterior tibial surface is closed. 10/04/2022: Both of his wounds are smaller today, particularly the first metatarsal head wound. 10/18/2022: He missed his appointment last week and ended up cutting off his wrap on Saturday. The anterior tibial wound reopened. It is fairly superficial with a little bit of slough on the surface. His lateral leg wound is smaller with some slough and eschar buildup. The first metatarsal head wound is also smaller with some callus and slough accumulation. 10/25/2022: All wounds are smaller. There is slough and eschar on the lateral leg and slough and callus on the plantar foot wound. The anterior tibial wound is clean and flush with the surrounding skin. No debris accumulation here. 11/01/2022: The wounds are all smaller again this week. There is slough on the lateral leg and some minimal slough and eschar on the anterior tibial wound. There is callus accumulation on the first metatarsal head site, along with slough. There is also a yeasty odor coming from the foot. 11/08/2022: The lateral leg wound is smaller except where he  picked some skin while waiting to be seen. There is a little bit of slough on the surface. The anterior tibial wound is closed. The first metatarsal head site has gotten macerated once again and has a lot of spongy wet tissue and callus around it.  No yeast odor today. 11/15/2022: The lateral leg wound continues to contract. There is now a band of epithelium dividing it into 2 areas. Minimal slough accumulation. He managed to pick open a new wound on his medial lower leg. The first metatarsal head site is smaller with some callus accumulation but no tissue maceration. 11/22/2022: The lateral leg wound is smaller again by about a third today. There is a little bit of slough on the surface with some periwound eschar. The new wound that he picked open last week has healed but he picked open 3 new small wounds on his anterior tibial surface, once again due to his picking at his skin. The first metatarsal head wound looks about the same. There has been some callus accumulation and there is [name] edema present, as he was not put in a compression wrap last week. 11/29/2022: The lateral leg wound is smaller again today. There is a little periwound eschar and some slough present. The wounds on his anterior tibial surface are all closed except for 1 that has a little bit of slough on the open portion with eschar covering it. The first metatarsal head wound measured a little bit smaller today, but mostly looks about the same. Edema control is better. 12/06/2022: The anterior tibial wound is closed. The lateral leg wound is smaller with some slough and eschar accumulation. The plantar foot ulcer is about the same size, but the tissue does show some evidence of the fact that he was on his feet quite a bit [name] this past week; there was a death in his family. There has been [name] callus accumulation and the tissues are little bit [name] purpleish. 12/13/2022: He picked the skin on his anterior tibia and reopened a wound there while he was waiting to be seen in clinic. The lateral leg wound is much smaller with a little bit of slough and eschar. The plantar foot ulcer is smaller today with callus and slough buildup, but without the pressure induced tissue injury  that was seen last week. 12/20/2022: The anterior tibial wound has healed. The lateral leg wound is smaller again this week. There is a little bit of eschar buildup on the surface. The foot had a fairly strong odor coming from it that persisted even after washing. The wound itself looks like its gotten a little smaller but he has built up thick callus, once again. 12/26/2022: The lateral leg wound is down to just a narrow superficial slit. There is slough and a little bit of dry skin present. The foot is in much better shape today. There is less callus accumulation and no odor. The skin edges are starting to roll inward, however. 01/03/2023: The lateral leg wound is healed. The skin is quite dry but the wound has closed. The foot continues to contract. He has a fair amount of periwound callus accumulation and the surface of the is a little drier than ideal. 01/10/2023: The large lateral leg wound remains closed. He managed to pick off some of his dry skin and has multiple small open superficial wounds on his lower leg. The foot wound measures smaller, but he has a blister immediately adjacent to it. 01/17/2023: The large lateral leg wound remains closed.  The small superficial wounds on his lower leg have also closed. The foot wound is about the same and the blister area immediately adjacent to it has dark eschar on the surface. The foot wound looks a bit dry. 01/24/2023: He picked at some dry skin on his lateral leg wound and reopened. The surface is dry and fibrotic. The foot wound is slightly smaller but has periwound callus and the edges are rolling inward again. 01/31/2023: His lateral leg wound is larger again today. It appears that the Ace bandage may be rubbing and causing some tissue breakdown. His foot wound is also a bit larger and the tissue does not appear particularly healthy. 02/07/2023: The lateral leg wound improved quite significantly over the last week. He has been applying additional gauze  over the site before applying the Ace bandage and this seems to have minimized the rubbing. The foot wound is about the same. The culture that I took last week returned with a polymicrobial population of predominantly skin flora. He is currently taking Augmentin as recommended by the culture data. BURRELL, HODAPP ([id number]) [id number] Page 10 of 25 02/14/2023: The lateral leg wound is covered in a layer of eschar. Underneath, the wound is nearly healed again. The foot wound looks better today. The depth around the edges has decreased. The tissue has a healthier appearance. He is still taking Augmentin. 02/21/2023: The lateral leg wound is smaller with a layer of eschar on the surface, but is not quite healed. The foot wound is stable but the depth around the edges is almost completely obliterated. There is some callus and senescent skin around the edges. 03/07/2023: The lateral leg wound is down to just a pinhole with a little bit of eschar on the surface. The foot wound demonstrates evidence that he has been walking excessively the past week and there is [name] callus with moisture underneath it, but the breakdown is limited to the callus layer and there is still intact skin underneath. 03/14/2023: The lateral leg wound is closed. The foot wound looks much improved this week. There is very minimal callus accumulation around the wound edges and the tissue surface appears healthier. It seems the adjustments made to his shoe by the [facility] were effective. 03/28/2023: The lateral leg wound has reopened. The patient reports that he was not moisturizing it as much as he probably should have been and it cracked and some skin peeled away. The foot wound shows signs of pressure-related trauma with some bruising and increased callus. The patient admits to extensive walking this past week while taking his wife to medical appointments. He does have a knee scooter and it is unclear why  he does not use it. 04/04/2023: The lateral leg wound is larger again today. The patient reports that he thinks maybe some dry skin pulled off when he was changing his dressing. It is quite dry and he states that he is using just water to moisten his endoform, rather than the hydrogel that was recommended. The foot wound is the same size, but the tissue is in better condition, without substantial bruising or friction-related trauma. 04/11/2023: Both wounds look better this week. Much of the lateral leg wound has epithelialized. The moisture balance is much better. The foot wound looks better than it has in months. The granulation tissue is flush with the surrounding skin and there is not much callus accumulation. 04/25/2023: The patient was out of town last week. The lateral leg ulcer has good moisture balance, but  has opened up a little bit [name] at the most proximal aspect. The foot ulcer has a layer of callus around the outside but is otherwise fairly clean with minimal slough buildup. 05/02/2023: He has 2 new wounds today. He has picked at the skin on his left anterior tibial surface and opened up the wound location. He picked a callus on his medial foot adjacent to his existing metatarsal head wound and opened up the site there. The lateral leg wound looks a little bit better this week and the moisture balance is excellent. The metatarsal head wound has a little bit of breakdown at the [name] proximal and but otherwise is also making improvement. Electronic Signature(s) Signed: 05/02/2023 10:51:10 AM By: [name] MD FACS Entered By: [name] on 05/02/2023 07:51:10 -------------------------------------------------------------------------------- Physical Exam Details Patient Name: Date of Service: [name] Grapes. 05/02/2023 10:00 A M Medical Record Number: [id number] Patient Account Number: [id number] Date of Birth/Sex: Treating RN: [birth year] (72 y.o. M) Primary Care Provider:  [name] Other Clinician: Referring Provider: Treating Provider/Extender: [name] in Treatment: 107 Constitutional Hypertensive, asymptomatic. . . . no acute distress. Respiratory Normal work of breathing on room air.. Notes 05/02/2023: He has 2 new wounds today. He has picked at the skin on his left anterior tibial surface and opened up the wound location. He picked a callus on his medial foot adjacent to his existing metatarsal head wound and opened up the site there. The lateral leg wound looks a little bit better this week and the moisture balance is excellent. The metatarsal head wound has a little bit of breakdown at the [name] proximal and but otherwise is also making improvement. Electronic Signature(s) Signed: 05/02/2023 10:53:28 AM By: [name] MD FACS Entered By: [name] on 05/02/2023 07:53:28 -------------------------------------------------------------------------------- Physician Orders Details Patient Name: Date of Service: [name] Grapes. 05/02/2023 10:00 A M Medical Record Number: [id number] Patient Account Number: [id number] IZAEL, BESSINGER ([id number]) [id number].pdf Page 11 of 25 Date of Birth/Sex: Treating RN: 1950-11-17 (72 y.o. 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 - Dr Lady Gary - Room 2 Return A Anesthetic (In clinic) Topical Lidocaine 4% applied to wound bed Bathing/ Shower/ Hygiene May shower and wash wound with soap and water. Off-Loading Wound #18R Left,Plantar Metatarsal head first Open toe surgical shoe to: - Front off loader Left ft Other: - minimal weight bearing left foot Additional Orders / Instructions Follow Nutritious Diet - vitamin C 500 mg 3 times a day and zinc 30-50 mg a day Wound Treatment Wound #18R - Metatarsal head first Wound Laterality: Plantar, Left Cleanser: Soap and Water 1 x Per Day/30 Days Discharge Instructions: May shower and wash wound with dial antibacterial soap and water prior to dressing change. Cleanser: Wound Cleanser 1 x Per Day/30 Days Discharge  Instructions: Cleanse the wound with wound cleanser prior to applying a clean dressing using gauze sponges, not tissue or cotton balls. Prim Dressing: Endoform 2x2 in 1 x Per Day/30 Days ary Discharge Instructions: Moisten with saline Secondary Dressing: Optifoam Non-Adhesive Dressing, 4x4 in (Generic) 1 x Per Day/30 Days Discharge Instructions: Apply over primary dressing as directed. Secondary Dressing: Woven Gauze Sponges 2x2 in (Generic) 1 x Per Day/30 Days Discharge Instructions: Apply over primary dressing as directed. Secured With: 94M Medipore H Soft Cloth Surgical T ape, 4 x 10 (in/yd) 1 x Per Day/30 Days Discharge Instructions: Secure with tape as directed. Wound #22R - Lower Leg Wound Laterality: Left, Lateral, Proximal Cleanser: Soap and Water 1 x Per Day/30 Days Discharge Instructions: May shower and wash wound with dial antibacterial soap and water prior to dressing change. Cleanser:  Wound Cleanser 1 x Per Day/30 Days Discharge Instructions: Cleanse the wound with wound cleanser prior to applying a clean dressing using gauze sponges, not tissue or cotton balls. Topical: Skintegrity Hydrogel 4 (oz) 1 x Per Day/30 Days Discharge Instructions: Apply hydrogel as directed Prim Dressing: Endoform 2x2 in 1 x Per Day/30 Days ary Discharge Instructions: Moisten with saline Secondary Dressing: Woven Gauze Sponge, Non-Sterile 4x4 in 1 x Per Day/30 Days Discharge Instructions: Apply over primary dressing as directed. [name], [name] ([id number]) [id number] Page 12 of 25 Secured With: 94M Medipore H Soft Cloth Surgical T ape, 4 x 10 (in/yd) 1 x Per Day/30 Days Discharge Instructions: Secure with tape as directed. Wound #36 - Lower Leg Wound Laterality: Left, Anterior Cleanser: Soap and Water 1 x Per Day/30 Days Discharge Instructions: May shower and wash wound with dial antibacterial soap and water prior to dressing change. Cleanser: Wound Cleanser 1 x Per Day/30 Days Discharge Instructions: Cleanse the wound with wound cleanser prior to applying a clean dressing using gauze sponges, not tissue or cotton balls. Prim Dressing: Maxorb Extra Ag+ Alginate Dressing, 2x2 (in/in) 1 x Per Day/30 Days ary Discharge Instructions: Apply to wound bed as instructed Secondary Dressing: Zetuvit Plus Silicone Border Dressing 3x3 (in/in) 1 x Per Day/30 Days Discharge Instructions: Apply silicone border over primary dressing as directed. Wound #37 - Foot Wound Laterality: Left, Medial Cleanser: Soap and Water 1 x Per Day/30 Days Discharge Instructions: May shower and wash wound with dial antibacterial soap and water prior to dressing change. Cleanser: Wound Cleanser 1 x Per Day/30 Days Discharge Instructions: Cleanse the wound with wound cleanser prior to applying a clean dressing using gauze sponges, not tissue or cotton balls. Prim Dressing: Endoform 2x2 in 1 x Per Day/30  Days ary Discharge Instructions: Moisten with saline Secondary Dressing: Optifoam Non-Adhesive Dressing, 4x4 in (Generic) 1 x Per Day/30 Days Discharge Instructions: Apply over primary dressing as directed. Secondary Dressing: Woven Gauze Sponges 2x2 in (Generic) 1 x Per Day/30 Days Discharge Instructions: Apply over primary dressing as directed. Secured With: 94M Medipore H Soft Cloth Surgical T ape, 4 x 10 (in/yd) 1 x Per Day/30 Days Discharge Instructions: Secure with tape as directed. Patient Medications llergies: No Known Drug Allergies A Notifications Medication Indication Start End 05/02/2023 lidocaine DOSE topical 4 % cream - cream topical Electronic Signature(s) Signed: 05/02/2023 2:17:41 PM By: [name] MD FACS Entered By: [name] on 05/02/2023 07:53:53 -------------------------------------------------------------------------------- Problem List Details Patient Name: Date of Service: [name] Grapes. 05/02/2023 10:00 A M Medical Record Number: [id number] Patient Account Number: [id number] Date of Birth/Sex: Treating RN: [birth year] (72 y.o. 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 03/28/2023 No Yes L97.828 Non-pressure chronic ulcer of other part of left lower leg with other specified 05/02/2023 No Yes severity I87.322 Chronic venous hypertension (idiopathic) with inflammation of left lower 04/12/2021 No Yes extremity L85.9 Epidermal thickening, unspecified 08/30/2022 No Yes E11.51 Type 2 diabetes  mellitus with diabetic peripheral angiopathy without gangrene 04/12/2021 No Yes I89.0 Lymphedema, not elsewhere classified 04/12/2021 No Yes Inactive Problems ICD-10 Code Description Active Date Inactive Date E11.621 Type 2 diabetes mellitus with foot ulcer 04/12/2021 04/12/2021 E11.42 Type 2 diabetes mellitus with diabetic polyneuropathy 04/12/2021 04/12/2021 L02.416 Cutaneous abscess of left lower limb 06/13/2021 06/13/2021 Resolved Problems ICD-10 Code Description Active Date Resolved Date L97.828 Non-pressure chronic ulcer of other part of left lower leg with other specified severity 01/10/2023 06/08/2022 Electronic Signature(s) Signed: 05/02/2023 10:48:09 AM By: [name] MD FACS Entered By: [name] on 05/02/2023 07:48:09 -------------------------------------------------------------------------------- Progress Note Details Patient Name: Date of Service: [name] Grapes. 05/02/2023 10:00 A M Medical Record Number: [id number] Patient Account Number: [id number] Date of Birth/Sex: Treating RN: 1951-05-06 (72 y.o. M) Primary Care Provider: [name] Other Clinician: Referring Provider: Treating Provider/Extender: [name] in Treatment: [address], Dola Factor ([id number]) [id number] Page 14 of 25 Chief Complaint Information obtained from Patient Left leg and foot ulcers 04/12/2021; patient is here for wounds on his left lower leg and left plantar foot over the first metatarsal head History of Present Illness (HPI) 10/11/17; Mr. Holness is a 72 year old man who tells me that in 2015 he slipped down the latter traumatizing his left leg. He developed a wound in the same spot the area that we are currently looking at. He states this closed over for the most part although he always felt it was somewhat unstable. In 2016 he hit the same area with the door of his car had this reopened. He tells me that this is never  really closed although sometimes an inflow it remains open on a constant basis. He has not been using any specific dressing to this except for topical antibiotics the nature of which were not really sure. His primary doctor did send him to see Dr. Jacinto Halim of interventional cardiology. He underwent an angiogram on 08/06/17 and he underwent a PTA and directional atherectomy of the lesser distal SFA and popliteal arteries which resulted in brisk improvement in blood flow. It was noted that he had 2 vessel runoff through the anterior tibial and peroneal. He is also been to see vascular and interventional radiologist. He was not felt to have any significant superficial venous insufficiency. Presumably is not a candidate for any ablation. It was suggested he come here for wound care. The patient is a type II diabetic on insulin. He also has a history of venous insufficiency. ABIs on the left were noncompressible in our clinic 10/21/17; patient we admitted to the clinic last week. 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 We put Iodosorb on him after an aggressive debridement and 3 layer compression. He complained of pain in his ankle and itching with is skin in fact he  scratched the area on the medial calf superiorly at the rim of our wraps and he has 2 small open areas in that location today which are new. I changed his primary dressing today to silver collagen. As noted he is already had revascularization and does not have any significant superficial venous insufficiency that would be amenable to ablation 10/28/17; patient admitted to the clinic 2 weeks ago. He has a smaller Wound. Scratch injury from last week revealed. There is large wound over the tibial area. This is smaller. Granulation looks healthy. No need for debridement. 11/04/17; the wound on the left lateral calf looks better. Improved dimensions. Surface of this looks better. We've  been maintaining him and Kerlix Coban wraps. He finds this much [name] comfortable. Silver collagen dressing 11/11/17; left lateral Wound continues to look healthy be making progress. Using a #5 curet I removed removed nonviable skin from the surface of the wound and then necrotic debris from the wound surface. Surface of the wound continues to look healthy. He also has an open area on the left great toenail bed. We've been using topical antibiotics. 11/19/17; left anterior lateral wound continues to look healthy but it's not closed. He also had a small wound above this on the left leg Initially traumatic wounds in the setting of significant chronic venous insufficiency and stasis dermatitis 11/25/17; left anterior wounds superiorly is closed still a small wound inferiorly. 12/02/17; left anterior tibial area. Arrives today with adherent callus. Post debridement clearly not completely closed. Hydrofera Blue under 3 layer compression. 12/09/17; left anterior tibia. Circumferential eschar however the wound bed looks stable to improved. We've been using Hydrofera Blue under 3 layer compression 12/17/17; left anterior tibia. Apparently this was felt to be closed however when the wrap was taken off there is a skin tear to reopen wounds in the same area we've been using Hydrofera Blue under 3 layer compression 12/23/17 left anterior tibia. Not close to close this week apparently the [facility] was stuck to this again. Still circumferential eschar requiring debridement. I put a contact layer on this this time under the Hydrofera Blue 12/31/17; left anterior tibia. Wound is better slight amount of hyper-granulation. Using Hydrofera Blue over Adaptic. 01/07/18; left anterior tibia. The wound had some surface eschar however after this was removed he has no open wound.he was already revascularized by Dr. Jacinto Halim when he came to our clinic with atherectomy of the left SFA and popliteal artery. He was also sent to  interventional radiology for venous reflux studies. He was not felt to have significant reflux but certainly has chronic venous changes of his skin with hemosiderin deposition around this area. He will definitely need to lubricate his skin and wear compression stocking and I've talked to him about this. READMISSION 05/26/2018 This is a now 72 year old man we cared for with traumatic wounds on his left anterior lower extremity. He had been previously revascularized during that admission by Dr. Jacinto Halim. Apparently in follow-up Dr. Jacinto Halim noted that he had deterioration in his arterial status. He underwent a stent placement in the distal left SFA on 04/22/2018. 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 He went back to the angiography suite on 04/30/2018 he underwent PTA and balloon angioplasty of the occluded left mid anterior tibial artery, thrombotic occlusion went from 100 to 0% which reconstitutes the posterior tibial artery. He had thrombectomy and aspiration of the peroneal artery. The stent placed in the distal SFA left SFA was still occluded. He was discharged on Xarelto,  it was noted on the discharge summary from this hospitalization that he had gangrene at the tip of his left fifth toe and there were expectations this would auto amputate. 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 He has been recuperating at [facility] home in [facility] after the most recent hospitalization. He is going home tomorrow. He tells me that 2 weeks ago he traumatized the tip of his left fifth toe. He came in urgently for our review of this. This was a history of before I noted that Dr. Jacinto Halim had already noted dry gangrenous changes of the left fifth toe 06/09/2018; 2-week follow-up. I did contact Dr. Jacinto Halim after his last appointment and he apparently saw 1 of Dr. Verl Dicker colleagues the next day. He does not follow-up with Dr. Jacinto Halim himself until  Thursday of this week. 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 Nevertheless there is no evidence of infection no drainage and no pain. He had a new area that this week when we were signing him in today on the left anterior mid tibia area, this is in close proximity to the previous wound we have dealt with in this clinic. 06/23/2018; 2-week follow-up. I did not receive a recent note from Dr. Jacinto Halim to review today. Our office is trying to obtain this. He is apparently not planning to do further vascular interventions and wondered about compression to try and help with the patient's chronic venous insufficiency. However we are also concerned about the arterial flow. He arrives in clinic today with a new area on the left third toe. The areas on the calf/anterior tibia are close to closing. The left fifth toe is still mummified using Betadine. -In reviewing things with the patient he has what sounds like claudication with mild to moderate amount of activity. 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 I prescribed Levaquin over the phone while we attempted to arrange a plan of care. However the patient called yesterday to report he had low-grade fever and he came in today acutely. There is been a marked deterioration in the left third toe with spreading cellulitis up into the dorsal left foot. He was referred to the emergency room. Readmission: 06/29/2020 patient presents today for reevaluation here in our clinic he was previously treated by Dr. Leanord Hawking at the latter part of 2019 in 2 the beginning of 2020. Subsequently we have not seen him since that time in the interim he did have evaluation with vein and vascular specialist specifically Dr. Bo Mcclintock who did perform quite extensive work for a left femoral to anterior tibial artery bypass. With that being said in the interim the patient has developed significant lymphedema and has wounds that he tells  me have really never healed in regard to the incision site on the left leg. He also has multiple wounds on the feet for various reasons some of which is that he tends to pick at his feet. Fortunately there is no signs of active infection systemically at this time he does have some wounds that are little bit deeper but most are fairly superficial he seems to have good blood flow and overall everything appears to be healthy I see no bone exposed and no obvious signs of osteomyelitis. I do not know that he necessarily needs a x-ray at this point although that something we could consider depending on how things progress.  The patient does have a history of lymphedema, diabetes, this is type II, chronic kidney disease stage III, hypertension, and history of peripheral vascular disease. 07/05/2020; patient admitted last week. Is a patient I remember from 2019 he had a spreading infection involving the left foot and we sent him to the hospital. 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 He subsequently had a left femoral to anterior tibial bypass by Dr.Cain vein and vascular. He also has severe lymphedema with chronic skin changes related to that on the left leg. TAREQ, DWAN ([id number]) [id number] Page 15 of 25 The most problematic area that was new today was on the left medial great toe. This was apparently a small area last week there was purulent drainage which our intake nurse cultured. Also areas on the left medial foot and heel left lateral foot. He has 2 areas on the left medial calf left lateral calf in the setting of the severe lymphedema. 07/13/2020 on evaluation today patient appears to be doing better in my opinion compared to his last visit. The good news is there is no signs of active infection systemically and locally I do not see any signs of infection either. He did have an x-ray which was negative that is great news he had a  culture which showed MRSA but at the same time he is been on the doxycycline which has helped. I do think we may want to extend this for 7 additional days 1/25; patient admitted to the clinic a few weeks ago. 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 We have been putting him under compression his edema control is a lot better but he is severe verricused skin on the left leg. He is really done quite well he still has an open area on the left medial calf and the left medial first metatarsal head. We have been using silver collagen on the leg silver alginate on the foot 07/27/2020 upon evaluation today patient appears to be doing decently well in regard to his wounds. He still has a lot of dry skin on the left leg. Some of this is starting to peel back and I think he may be able to have them out by removing some that today. Fortunately there is no signs of active infection at this time on the left leg although on the right leg he does appear to have swelling and erythema as well as some mild warmth to touch. This does have been concerned about the possibility of cellulitis although within the differential diagnosis I do think that potentially a DVT has to be at least considered. We need to rule that out before proceeding would just call in the cellulitis. Especially since he is having pain in the posterior aspect of his calf muscle. 2/8; the patient had seen sparingly. He has severe skin changes of chronic lymphedema in the left leg thickened hyperkeratotic verrucous skin. He has an open wound on the medial part of the left first met head left mid tibia. He also has a rim of nonepithelialized skin in the anterior mid tibia. He brought in the AmLactin lotion that was been prescribed although I am not sure under compression and its utility. There concern about cellulitis on the right lower leg the last time he was here. He was put on on antibiotics. His DVT rule  out was negative. The right leg looks fine he is using his  stocking on this area 08/10/2020 upon evaluation today patient appears to be doing well with regard to his leg currently. He has been tolerating the dressing changes without complication. Fortunately there is no signs of active infection which is great news. Overall very pleased with where things stand. 2/22; the patient still has an area on the medial part of the left first met his head. This looks better than when I last saw this earlier this month he has a rim of epithelialization but still some surface debris. Mostly everything on the left leg is healed. There is still a vulnerable in the left mid tibia area. 08/30/2020 upon evaluation today patient appears to be doing much better in regard to his wounds on his foot. 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 Nonetheless I think we will address that today I Minna send in a prescription for him in that regard. Overall though there does not appear to be any signs of significant worsening. 09/07/2020 on evaluation today patient's wounds over his left foot appear to be doing excellent. I do not see any signs of infection there is some callus buildup this can require debridement for certain but overall I feel like he is managing quite nicely. He still using the AmLactin cream which has been beneficial for him as well. 3/22; left foot wound is closed. There is no open area here. He is using ammonium lactate lotion to the lower extremities to help exfoliate dry cracked skin. He has compression stockings from elastic therapy in [place]. The wound on the medial part of his left first met head is healed today. READMISSION 04/12/2021 Mr. Mcgath is a patient we know fairly well he had a prolonged stay in clinic in 2019 with wounds on his left lateral and left anterior  lower extremity in the setting of chronic venous insufficiency. [name] recently he was here earlier this year with predominantly an area on his left foot first metatarsal head plantar and he says the plantar foot broke down on its not long after we discharged him but he did not come back here. The last few months areas of broken down on his left anterior and again the left lateral lower extremity. The leg itself is very swollen chronically enlarged a lot of hyperkeratotic dry Berry Q skin in the left lower leg. His edema extends well into the thigh. He was seen by Dr. Randie Heinz. He had ABIs on 03/02/2021 showing an ABI on the right of 1 with a TBI of 0.72 his ABI in the left at 1.09 TBI of 0.99. Monophasic and biphasic waveforms on the right. On the left monophasic waveforms were noted he went on to have an angiogram on 03/27/2021 this showed the aortic aortic and iliac segments were free of flow-limiting stenosis the left common femoral vein to evaluate the left femoral to anterior tibial artery bypass was unobstructed the bypass was patent without any areas of stenosis. We discharged the patient in bilateral juxta lite stockings but very clearly that was not sufficient to control the swelling and maintain skin integrity. He is clearly going to need compression pumps. 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 This is fortunate because he is on his feet for long periods of time. 10/27; patient comes in with our intake  nurse reporting copious amount of green drainage from the left anterior mid tibia the left dorsal foot and to a lesser extent the left medial mid tibia. We left the compression wrap on all week for the amount of edema in his left leg is quite a bit better. We use silver alginate as the primary dressing 11/3; edema control is good. Left anterior lower leg left medial lower leg and the plantar first metatarsal head. The left anterior lower leg  required debridement. Deep tissue culture I did of this wound showed MRSA I put him on 10 days of doxycycline which she will start today. We have him in compression wraps. He has a security card and [employer] however he is retiring on November 15. We will need to then get him into a better offloading boot for the left foot perhaps a total contact cast 11/10; edema control is quite good. Left anterior and left medial lower leg wounds in the setting of chronic venous insufficiency and lymphedema. He also has a substantial area over the left plantar first metatarsal head. 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 He has significant hypergranulation on the left plantar foot wound. The patient is a diabetic but he does not have significant PAD 11/17; edema control is quite good. Left anterior and left medial lower leg wounds look better. The really concerning area remains the area on the left plantar first metatarsal head. He has a rim of epithelialization. 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 Starting today with a forefoot off loader but . possibly a total contact cast. 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 Arrives with a very significant odor from I think all of the wounds on the left leg and the left foot. Because of this I did not put a total contact cast on him today but will could still consider this. His wife was having cataract surgery which is the reason he missed the appointment 12/6. I saw this man 5 days ago with a swelling below the popliteal fossa. I thought he actually might have a Baker's cyst however the DVT rule out study that we could arrange right away was negative  the technician told me this was not a ruptured Baker's cyst. We attempted to get this aspirated by under ultrasound guidance in interventional radiology however all they did was an ultrasound however it shows an extensive fluid collection 62 x 8 x 9.4 in the left thigh and left calf. The patient states he thinks this started 8 days ago or so but he really is not complaining of any pain, fever or systemic symptoms. He has not ha 12/20; after some difficulty I managed to get the patient into see Dr. Randie Heinz. Eventually he was taken into the hospital and had a drain put in the fluid collection below his left knee posteriorly extending into the posterior thigh. He still has the drain in place. Culture of this showed moderate staff aureus few Morganella and few Klebsiella he is now on doxycycline and ciprofloxacin as suggested by infectious disease he is on this for a month. The drain will remain in place until it stops  draining 12/29; he comes in today with the 1 wound on his left leg and the area on the left plantar first met head significantly smaller. Both look healthy. He still has the drain in the left leg. He says he has to change this daily. Follows up with Dr. Randie Heinz on January 11. [name], [name] ([id number]) [id number] Page 16 of 25 06/29/2021; the wounds that I am following on the left leg and left first met head continued to be quite healthy. However the area where his inferior drain is in place had copious amounts of drainage which was green in color. The wound here is larger. Follows up with Dr. Pascal Lux of vein and vascular his surgeon next week as well as infectious disease. He remains on ciprofloxacin and doxycycline. He is not complaining of excessive pain in either one of the drain areas 1/12; the patient saw vascular surgery and infectious disease. Vascular surgery has left the drain in place as there was still some notable drainage still see him back in 2 weeks. Dr.  Dorthula Perfect stop the doxycycline and ciprofloxacin and I do not believe he follows up with them at this point. Culture I did last week showed both doxycycline resistant MRSA and Pseudomonas not sensitive to ciprofloxacin although only in rare titers 1/19; the patient's wound on the left anterior lower leg is just about healed. We have continued healing of the area that was medially on the left leg. Left first plantar metatarsal head continues to get smaller. The major problem here is his 2 drain sites 1 on the left upper calf and lateral thigh. There is purulent drainage still from the left lateral thigh. I gave him antibiotics last week but we still have recultured. He has the drain in the area I think this is eventually going to have to come out. I suspect there will be a connecting wound to heal here perhaps with improved VAc 1/26; the patient had his drain removed by vein and vascular on 1/25/. This was a large pocket of fluid in his left thigh that seem to tunnel into his left upper calf. 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 His mention his Penrose drain was removed today. He now has a tunneling wound on his left calf and left thigh. Both of these probe widely towards each other although I cannot really prove that they connect. Both wounds on his lower leg anteriorly are closed and his area over the first metatarsal head on his right foot continues to improve. We are using Hydrofera Blue here. He also saw infectious disease culture of the abscess they noted was polymicrobial with MRSA, Morganella and Klebsiella he was treated with doxycycline and ciprofloxacin for 4 weeks ending on 07/03/2021. They did not recommend any further antibiotics. Notable that while he still had the Penrose drain in place last week he had purulent drainage coming out of the inferior IandD site this grew [facility], MRSA and Pseudomonas but there does not appear to be any active infection in this area  today with the drain out and he is not systemically unwell 2/2; with regards to the drain sites the superior one on the thigh actually is closed down the one on the upper left lateral calf measures about 8 and half centimeters which is an improvement seems to be less prominent although still with a lot of drainage. The only remaining wound is over the first metatarsal head on the left foot and this looks to be  continuing to improve with Hydrofera Blue. 2/9; the area on his plantar left foot continues to contract. Callus around the wound edge. The drain sites specifically have not come down in depth. We put the wound VAC on Monday he changed the canister late last night our intake nurse reported a pocket of fluid perhaps caused by our compression wraps 2/16; continued improvement in left foot plantar wound. drainage site in the calf is not improved in terms of depth (wound vac) 2/23; continued improvement in the left foot wound over the first metatarsal head. With regards to the drain sites the area on his thigh laterally is healed however the open area on his calf is small in terms of circumference by still probes in by about 15 cm. Within using the wound VAC. Hydrofera Blue on his foot 08/24/2021: The left first metatarsal head wound continues to improve. The wound bed is healthy with just some surrounding callus. Unfortunately the open drain site on his calf remains open and tunnels at least 15 cm (the extent of a Q-tip). This is despite several weeks of wound VAC treatment. Based on reading back through the notes, there has been really no significant change in the depth of the wound, although the orifice is smaller and the [name] cranial wound on his thigh has closed. I suspect the tunnel tracks nearly all the way to this location. 08/31/2021: Continued improvement in the left first metatarsal head wound. There has been absolutely no improvement to the long tunnel from his open drain site on his calf.  We have tried to get him into see vascular surgery sooner to consider the possibility of simply filleting the tract open and allowing it to heal from the bottom up, likely with a wound VAC. They have not yet scheduled a sooner appointment than his current mid April 09/14/2021: He was seen by vascular surgery and they took him to the operating room last week. They opened a portion of the tunnel, but did not extend the entire length of the known open subcutaneous tract. I read Dr. Darcella Cheshire operative note and it is not clear from that documentation why only a portion of the tract was opened. The heaped up granulation tissue was curetted and removed from at least some portion of the tract. They did place a wound VAC and applied an Unna boot to the leg. The ulcer on his left first metatarsal head is smaller today. The bed looks good and there is just a small amount of surrounding callus. 09/21/2021: The ulcer on his left first metatarsal head looks to be stalled. There is some callus surrounding the wound but the wound bed itself does not appear particularly dynamic. The tunnel tract on his lateral left leg seems to be roughly the same length or perhaps slightly smaller but the wound bed appears healthy with good granulation tissue. He opened up a new wound on his medial thigh and the site of a prior surgical incision. He says that he did this unconsciously in his sleep by scratching. 09/28/2021: Unfortunately, the ulcer on his left first metatarsal head has extended underneath the callus toward the dorsum of his foot. The medial thigh wounds are roughly the same. The tunnel on his lateral left leg continues to be problematic; it is longer than we are able to actually probe with a Q-tip. I am still not certain as to why Dr. Randie Heinz did not open this up entirely when he took the patient to the operating room. We will likely  be back in the same situation with just a small superficial opening in a long unhealed tract,  as the open portion is granulating in nicely. 10/02/2021: The patient was initially scheduled for a nurse visit, but we are also applying a total contact cast today. The plantar foot wound looks clean without significant accumulated callus. We have been applying Prisma silver collagen to the site. 10/05/2021: The patient is here for his first total contact cast change. We have tried using gauze packing strips in the tunnel on his lateral leg wound, but this does not seem to be working any better than the white VAC foam. The foot ulcer looks about the same with minimal periwound callus. Medial thigh wound is clean with just some overlying eschar. 10/12/2021: The plantar foot wound is stable without any significant accumulation of periwound callus. The surface is viable with good granulation tissue. The medial thigh wounds are much smaller and are epithelializing. On the other hand, he had purulent drainage coming from the tunnel on his lateral leg. He does go back to see Dr. Randie Heinz next week and is planning to ask him why the wound tunnel was not completely opened at the time of his most recent operation. 10/19/2021: The plantar foot wound is markedly improved and has epithelial tissue coming through the surface. The medial thigh wounds are nearly closed with just a tiny open area. He did see Dr. Randie Heinz earlier this week and apparently they did discuss the possibility of opening the sinus tract further and enabling a wound VAC application. Apparently there are some limits as to what Dr. 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 I think if we could get the tract open to the level of the popliteal fossa, this would greatly aid in her ability to get this chart closed. That being said, however, today when I probed the tract with a Q-tip, I was not able to insert the entirety of the Q-tip as I have on previous occasions. The tunnel is shorter by about 4 cm. The surface is clean  with good granulation tissue and no further episodes of purulent drainage. 10/30/2021: Last week, the patient underwent surgery and had the long tract in his leg opened. There was a rind that was debrided, according to the operative report. His medial thigh ulcers are closed. The plantar foot wound is clean with a good surface and some built up surrounding callus. 11/06/2021: The overall dimensions of the large wound on his lateral leg remain about the same, but there is good granulation tissue present and the tunneling is a little bit shorter. He has a new wound on his anterior tibial surface, in the same location where he had a similar lesion in the past. The plantar foot wound is clean with some buildup surrounding callus. Just toward the medial aspect of his foot, however, there is an area of darkening that once debrided, revealed another opening in the skin surface. 11/13/2021: The anterior tibial surface wound is closed. The plantar foot wound has some surrounding callus buildup. The area of darkening that I debrided last week and revealed an opening in the skin surface has closed again. The tunnel in the large wound on his lateral leg has come in by about 3 cm. There is healthy granulation tissue on the entire wound surface. [name], [name] ([id number]) [id number] Page 17 of 25 11/23/2021: The patient was out of town last week and did wet-to-dry dressings on his large wound. 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 Unfortunately, he picked open the wound on his left medial thigh. He says that it was itching and he just could not stop scratching it until it was open again. The wound on his plantar foot is smaller and has not accumulated a tremendous amount of callus. The lateral leg wound is shallower and the tunnel has also decreased in depth. There is just a little bit of slough accumulation on the  surface. 11/30/2021: Another portion of his left medial thigh has been opened up. All of these wounds are fairly superficial with just a little bit of slough and eschar accumulation. The wound on his plantar foot is almost closed with just a bit of eschar and periwound callus accumulation. The lateral leg wound is nearly flush with the surrounding skin and the tunnel is markedly shallower. 12/07/2021: There is just 1 open area on his left medial thigh. It is clean with just a little bit of perimeter eschar. The wound on his plantar foot continues to contract and just has some eschar and periwound callus accumulation. The lateral leg wound is closing at the [name] distal aspect and the tunnel is smaller. The surface is nearly flush with the surrounding skin and it has a good bed of granulation tissue. 12/14/2021: The thigh and foot wounds are closed. The lateral leg wound has closed over approximately half of its length. The tunnel continues to contract and the surface is now flush with the surrounding skin. The wound bed has robust granulation tissue. 12/22/2021: The thigh and foot wounds have reopened. The foot wound has a lot of callus accumulation around and over it. The thigh wound is tiny with just a little bit of slough in the wound bed. The lateral leg wound continues to contract. His vascular surgeon took the wound VAC off earlier in the week and the patient has been doing wet-to-dry dressings. There is a little slough accumulation on the surface. The tunnel is about 3 cm in depth at this point. 12/28/2021: The thigh wound is closed again. The foot wound has some callus that subsequently has peeled back exposing just a small slit of a wound. The lateral leg wound Is down to about half the size that it originally was and the tunnel is down to about half a centimeter in depth. 01/04/2022: The thigh wound remains closed. The foot wound has heavy callus overlying the wound site. Once this was debrided, the  wound was found to be closed. The lateral leg wound is smaller again this week and very superficial. No tunnel could be identified. 01/12/2022: The thigh and foot wounds both remain closed. The lateral leg wound is now nearly flush with the skin surface. There is good granulation tissue present with a light layer of slough. 01/19/2022: Due to the way his wrap was placed, the patient did not change the dressing on his thigh at all and so the foam was saturated and his skin is macerated. There is a light layer of slough on the wound surface. The underlying granulation tissue is robust and healthy-appearing. He has heavy callus buildup at the site of his first metatarsal head wound which is still healed. 02/01/2022: He has been in silver alginate. When he removed the dressing from his thigh wound, however, some leg, superficially reopening a portion of the wound that had healed. In addition, underneath the callus at his left first metatarsal head, there appears to be  a blister and the wound appears to be open again. 02/08/2022: The lateral leg wound has contracted substantially. There is eschar and a light layer of slough present. He says that it is starting to pull and is uncomfortable. On inspection, there is some puckering of the scar and the eschar is quite dry; this may account for his symptoms. On his first metatarsal head, the wound is much smaller with just some eschar on the surface. The callus has not reaccumulated. He reports that he had a blister come up on his medial thigh wound at the distal aspect. It popped and there is now an opening in his skin again. Looking back through his library of wound photos, there is what looks like a permanent suture just deep to this location and it may be trying to erode through. We have been using silver alginate on his wounds. 02/15/2022: The lateral leg wound is about half the size it was last week. It is clean with just a little perimeter eschar and light  slough. The wound on his first metatarsal head is about the same with heavy callus overlying it. The medial thigh wound is closed again. He does have some skin changes on the top of his foot that looks potentially yeast related. 02/22/2022: The skin on the top of his foot improved with the use of a topical antifungal. The lateral leg wound continues to contract and is again smaller this week. There is a little bit of slough and eschar on the surface. The first metatarsal head wound is a little bit smaller but has reaccumulated a thick callus over the top. He decided to try to trim his toenail and ultimately took the entire nail off of his left great toe. 03/02/2022: His lateral leg wound continues to improve, as does the wound on his left great toe. Unfortunately, it appears that somehow his foot got wet and moisture seeped in through the opening causing his skin to lift. There is a large wound now overlying his first metatarsal on both the plantar, medial, and dorsal portion of his foot. There is necrotic tissue and slough present underneath the shaggy macerated skin. 03/08/2022: The lateral leg wound is smaller again today. There is just a light layer of slough and eschar on the surface. The great toe wound is smaller again today. The first metatarsal wound is a little bit smaller today and does not look nearly as necrotic and macerated. There is still slough and nonviable tissue present. 03/15/2022: The lateral leg wound is narrower and just has a little bit of light slough buildup. The first metatarsal wound still has a fair amount of moisture affecting the periwound skin. The great toe wound is healed. 03/22/2022: The lateral leg wound is now isolated to just at the level of his knee. There is some eschar and slough accumulation. The first metatarsal head wound has epithelialized tremendously and is about half the size that it was last week. He still has some maceration on the top of his foot and a  fungal odor is present. 03/29/2022: T oday the patient's foot was macerated, suggesting that the cast got wet. The patient has also been picking at his dry skin and has enlarged the wound on his left lateral leg. In the time between having his cast removed and my evaluation, he had picked [name] dry skin and opened up additional wounds on his Achilles area and dorsal foot. The plantar first metatarsal head wound, however, is smaller and clean with just  macerated callus around the perimeter and light slough on the surface. The lateral leg wound measured a little bit larger but is also fairly clean with eschar and minimal slough. 04/02/2022: The patient had vascular studies done last Friday and so his cast was not applied. He is here today to have that done. 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 04/05/2022: Both wounds are smaller and quite clean. There is just a little biofilm on the lateral leg wound. 10/20; the patient has a wound on the left lateral surgical incision at the level of his lateral knee this looks clean and improved. He is using silver alginate. He also has an area on his left medial foot for which she is using Hydrofera Blue under a total contact cast both wounds are measuring smaller 04/20/2022: The plantar foot wound has contracted considerably and is very close to closing. The lateral leg wound was measured a little larger, but there was a tiny open area that was included in the measurements that was not included last week. He has some eschar around the perimeter but otherwise the wound looks clean. 04/27/2022: The lateral leg wound looks better this week. He says that midweek, he felt it was very dry and began applying hydrogel to the site. I think this was beneficial. The foot wound is nearly closed underneath a thick layer of dry skin and callus. 05/04/2022: The foot wound is healed. He has developed a new small ulcer on his anterior tibial surface about midway up his  leg. It has a little slough on the surface. The lateral leg wound still is fairly dry, but clean with just a little biofilm on the surface. 05/11/2022: The wound on his foot reopened on Wednesday. A large blister formed which then broke open revealing the fat layer underneath. The ulcer on his anterior tibial surface is a little bit larger this week. The lateral leg wound has much better moisture balance this week. Fortunately, prior to his foot wound reopening, he did get the cast made for his orthotic. JIMMY, PLESSINGER ([id number]) [id number] Page 18 of 25 05/15/2022: Already, the left medial foot wound has improved. The tissue is less macerated and the surface is clean. The ulcer on his anterior tibial surface continues to enlarge. This seems likely secondary to accumulated moisture. The lateral leg wound continues to have an improved moisture balance with the use of collagen. 05/25/2022: The medial foot wound continues to contract. It is now substantially smaller with just a little slough on the surface. The anterior tibial surface wound continues to enlarge further. Once again, this seems to be secondary to moisture. The lateral leg wound does not seem to be changing much in size, but the moisture balance is better. 06/01/2022: The anterior tibial wound is closed. The medial foot wound is down to just a very small, couple of millimeters, opening. The lateral leg wound has good moisture balance, but remains unchanged in size. 12/15; the patient's anterior tibial wound has reopened, however the area on his right first metatarsal head is closed. The major wound is actually on the superior part of his surgical wound in the left lateral thigh. Not a completely viable surface under illumination. This may at some point require a debridement I think he is currently using Prisma. As noted the left medial foot wound has closed 06/14/2022: The anterior tibial wound has closed. The  lateral leg wound has a better surface but is basically unchanged in size. The left medial foot  wound has reopened. It looks as though there was some callus accumulation and moisture got under the callus which caused the tissue to break down again. 06/21/2022: A new wound has opened up just distal to the previous anterior tibial wound. It is small but has hypertrophic granulation tissue present. The lateral leg wound is a little bit narrower and has a layer of slough on the surface. The left medial foot wound is down to just a pinhole. His custom orthotics should be available next week. 06/28/2022: The wound on his first metatarsal head has healed. He has developed a new small wound on his medial lower leg, in an old scar site. The lateral leg wound continues to contract but continues to accumulate slough, as well. 07/03/2022: Despite wearing his custom orthopedic shoes, he managed to reopen the wound on his first metatarsal head. He says he thinks his foot got wet and then some skin lifted up and he peeled this away. Both of the lower leg wounds are smaller and have some dry eschar on the surface. The lateral leg wound is quite a bit narrower today. 07/12/2022: The medial lower leg wound is closed. The anterior lower leg wound has contracted considerably. The lateral upper leg wound is narrower with a layer of slough on the surface. The first metatarsal head wound is also smaller, but had copious drainage which saturated the foam border dressing and resulted in some periwound tissue maceration. Fortunately there was no breakdown at this site. 07/19/2022: The lower leg shows signs of significant maceration; I think he must be sweating excessively inside his cast. There are several areas of skin breakdown present. The wound on his foot is smaller and that on his lateral leg is narrower and is shorter by about a centimeter. 07/26/2022: Last week we used a zinc Coflex wrap prior to applying his total contact  cast and this has had the effect of keeping his skin from getting macerated this week. The anterior leg wound has epithelialized substantially. The lateral leg wound is significantly smaller with just a bit of slough on the surface. The first metatarsal head wound is also smaller this week. 08/02/2022: The anterior leg wound was closed on arrival, but while he was sitting in the room, he picked it open again. The lateral leg wound is smaller with just a little slough on the surface and the first metatarsal head wound has contracted further, as well. 08/09/2022: The first metatarsal head wound is covered with callus. Underneath the callus, it is nearly completely closed. The lateral leg wound is smaller again this week. The anterior leg wound looks better, but he has such heavy buildup of old skin, that moisture is getting underneath this, becoming trapped, and causing the underlying skin to get macerated and open up. 08/16/2022: The first metatarsal head wound is closed. The lateral leg wound continues to contract and is quite a bit smaller again this week. There is just a small, superficial opening remaining on his anterior tibial surface. 08/23/2022: The first metatarsal head wound has, by some miracle, remained closed. The lateral leg wound is substantially smaller with multiple areas of epithelialization. The anterior tibial surface wound is also quite a bit smaller and very clean. 08/30/2022: Unfortunately, his first metatarsal head wound opened up again. It happened in the same fashion as it has on prior occasions. Moisture got under dried skin/callus and created a wound when he removed his sock, taking the skin with it. The anterior tibial surface has a thick  shell of hyperkeratotic skin. This has been contributing to ongoing repeat wounding events as moisture gets underneath this and causes tissue breakdown. 3/15; patient presents for follow-up. His anterior left leg wound has healed. He still has  the wound to the left lateral aspect and left first met head. We have been using silver alginate and endoform to these areas under [employer]. He has no issues or complaints today. He has been taking Augmentin and reports improvement to his symptoms to the left first met head. 09/13/2022: He has accumulated [name] thick dry skin in sheets on his lower leg. The lateral leg wound is about the same size and the left first metatarsal head wound is a little bit smaller. There is slough on both surfaces. There is callus buildup around the foot wound. 09/20/2022: The lateral leg wound is a little bit narrower and the left first metatarsal head wound also seems to have contracted slightly. There is slough on both surfaces. He has a little skin breakdown on his anterior tibial surface. 09/27/2022: The lateral leg wound continues to contract and is quite clean. The first metatarsal head wound is also smaller. There is some perimeter callus and slough accumulation on the foot. The anterior tibial surface is closed. 10/04/2022: Both of his wounds are smaller today, particularly the first metatarsal head wound. 10/18/2022: He missed his appointment last week and ended up cutting off his wrap on Saturday. The anterior tibial wound reopened. It is fairly superficial with a little bit of slough on the surface. His lateral leg wound is smaller with some slough and eschar buildup. The first metatarsal head wound is also smaller with some callus and slough accumulation. 10/25/2022: All wounds are smaller. There is slough and eschar on the lateral leg and slough and callus on the plantar foot wound. The anterior tibial wound is clean and flush with the surrounding skin. No debris accumulation here. 11/01/2022: The wounds are all smaller again this week. There is slough on the lateral leg and some minimal slough and eschar on the anterior tibial wound. There is callus accumulation on the first metatarsal head site, along with slough.  There is also a yeasty odor coming from the foot. 11/08/2022: The lateral leg wound is smaller except where he picked some skin while waiting to be seen. There is a little bit of slough on the surface. The anterior tibial wound is closed. The first metatarsal head site has gotten macerated once again and has a lot of spongy wet tissue and callus around it. No yeast odor today. 11/15/2022: The lateral leg wound continues to contract. There is now a band of epithelium dividing it into 2 areas. Minimal slough accumulation. He managed to pick open a new wound on his medial lower leg. The first metatarsal head site is smaller with some callus accumulation but no tissue maceration. 11/22/2022: The lateral leg wound is smaller again by about a third today. There is a little bit of slough on the surface with some periwound eschar. The new [name], [name] ([id number]) [phone].pdf Page 19 of 25 wound that he picked open last week has healed but he picked open 3 new small wounds on his anterior tibial surface, once again due to his picking at his skin. The first metatarsal head wound looks about the same. There has been some callus accumulation and there is [name] edema present, as he was not put in a compression wrap last week. 11/29/2022: The lateral leg wound is smaller again today. There  is a little periwound eschar and some slough present. The wounds on his anterior tibial surface are all closed except for 1 that has a little bit of slough on the open portion with eschar covering it. The first metatarsal head wound measured a little bit smaller today, but mostly looks about the same. Edema control is better. 12/06/2022: The anterior tibial wound is closed. The lateral leg wound is smaller with some slough and eschar accumulation. The plantar foot ulcer is about the same size, but the tissue does show some evidence of the fact that he was on his feet quite a bit [name] this past week; there  was a death in his family. There has been [name] callus accumulation and the tissues are little bit [name] purpleish. 12/13/2022: He picked the skin on his anterior tibia and reopened a wound there while he was waiting to be seen in clinic. The lateral leg wound is much smaller with a little bit of slough and eschar. The plantar foot ulcer is smaller today with callus and slough buildup, but without the pressure induced tissue injury that was seen last week. 12/20/2022: The anterior tibial wound has healed. The lateral leg wound is smaller again this week. There is a little bit of eschar buildup on the surface. The foot had a fairly strong odor coming from it that persisted even after washing. The wound itself looks like its gotten a little smaller but he has built up thick callus, once again. 12/26/2022: The lateral leg wound is down to just a narrow superficial slit. There is slough and a little bit of dry skin present. The foot is in much better shape today. There is less callus accumulation and no odor. The skin edges are starting to roll inward, however. 01/03/2023: The lateral leg wound is healed. The skin is quite dry but the wound has closed. The foot continues to contract. He has a fair amount of periwound callus accumulation and the surface of the is a little drier than ideal. 01/10/2023: The large lateral leg wound remains closed. He managed to pick off some of his dry skin and has multiple small open superficial wounds on his lower leg. The foot wound measures smaller, but he has a blister immediately adjacent to it. 01/17/2023: The large lateral leg wound remains closed. The small superficial wounds on his lower leg have also closed. The foot wound is about the same and the blister area immediately adjacent to it has dark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 [id number] Date of Birth/Sex: Treating RN: June 23, 1951 (72 y.o. 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 hypertension (idiopathic) with inflammation of left lower extremity L85.9 Epidermal thickening, unspecified 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 Facility Procedures : CPT4 Code: [id number] Description: 11042 - DEB SUBQ TISSUE 20 SQ CM/< ICD-10 Diagnosis Description L97.528 Non-pressure chronic ulcer of other part of left foot with other specified sever 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 L97.828  Non-pressure chronic ulcer of other part of left lower leg with other  specified Modifier: ity severity Quantity:  1 Physician Procedures ARYAAN, PERSICHETTI ([id number]): CPT4 Code Description [id number] 99214 - WC PHYS LEVEL 4 - EST PT ICD-10 Diagnosis Description L97.528 Non-pressure chronic ulcer of other part of left foot with other L97.828 Non-pressure chronic ulcer of other part of left  lower leg with o L97.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 with other specified sev I87.322 Chronic venous hypertension (idiopathic) with inflammation of lef 201 156 [id number].pdf Page 25 of 25: Quantity Modifier 1 specified severity ther specified severity erity t lower extremity JABORI, HENEGAR ([id number]): [id number] 11042 - WC PHYS SUBQ TISS 20 SQ CM ICD-10 Diagnosis Description L97.528 Non-pressure chronic ulcer of other part of left foot with other 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

## 2023-05-08 NOTE — Progress Notes (Signed)
[name] ([id number]) [id number] Page 1 of 13 Visit Report for 05/02/2023 Arrival Information Details Patient Name: Date of Service: [name], [name] 05/02/2023 10:00 A M Medical Record Number: [id number] Patient Account Number: [id number] Date of Birth/Sex: Treating RN: 02/08/1951 (72 y.o. [name] Primary Care Katlin Ciszewski: [name] Other Clinician: Referring Oaklee Esther: Treating Janeva Peaster/Extender: [name] in Treatment: 107 Visit Information History Since Last Visit Added or deleted any medications: No Patient Arrived: Ambulatory Any new allergies or adverse reactions: No Arrival Time: 10:07 Had a fall or experienced change in No Accompanied By: self activities of daily living that may affect Transfer Assistance: None risk of falls: Patient Identification Verified: Yes Signs or symptoms of abuse/neglect since last visito No Secondary Verification Process Completed: Yes Hospitalized since last visit: No Patient Requires Transmission-Based Precautions: No Implantable device outside of the clinic excluding No Patient Has Alerts: Yes cellular tissue based products placed in the center since last visit: Has Dressing in Place as Prescribed: Yes Pain Present Now: No Electronic Signature(s) Signed: 05/02/2023 3:21:42 PM By: [name] Entered By: [name] on 05/02/2023 07:07:56 -------------------------------------------------------------------------------- Encounter Discharge Information Details Patient Name: Date of Service: [name]. 05/02/2023 10:00 A M Medical Record Number: [id number] Patient Account Number: [id number] Date of Birth/Sex: Treating RN: 12-12-1950 (72 y.o. [name] Primary Care Ramey Schiff: [name] Other Clinician: Referring Ninoska Goswick: Treating Bliss Behnke/Extender: [name] in Treatment: 107 Encounter Discharge Information Items  Post Procedure Vitals Discharge Condition: Stable Temperature (F): 98.2 Ambulatory Status: Ambulatory Pulse (bpm): 71 Discharge Destination: Home Respiratory Rate (breaths/min): 18 Transportation: Private Auto Blood Pressure (mmHg): 150/80 Accompanied By: self Schedule Follow-up Appointment: Yes Clinical Summary of Care: Patient Declined Electronic Signature(s) Signed: 05/02/2023 3:21:42 PM By: [name] Entered By: [name] on 05/02/2023 07:50:09 [name] ([id number]) [id number] Page 2 of 13 -------------------------------------------------------------------------------- Lower Extremity Assessment Details Patient Name: Date of Service: [name], [name] 05/02/2023 10:00 A M Medical Record Number: [id number] Patient Account Number: [id number] Date of Birth/Sex: Treating RN: 04-Jul-1950 (72 y.o. [name] Primary Care Frimet Durfee: [name] Other Clinician: Referring Wylder Macomber: Treating Kaleiyah Polsky/Extender: [name] in Treatment: 107 Edema Assessment Assessed: [name]: No] [name]: No] Edema: [Left: Ye] [Right: s] Calf Left: Right: Point of Measurement: 41 cm From Medial Instep 43.5 cm Ankle Left: Right: Point of Measurement: 10 cm From Medial Instep 27.5 cm Vascular Assessment Pulses: Dorsalis Pedis Palpable: [Left:Yes] Extremity colors, hair growth, and conditions: Extremity Color: [Left:Hyperpigmented] Hair Growth on Extremity: [Left:No] Temperature of Extremity: [Left:Warm] Capillary Refill: [Left:< 3 seconds] Dependent Rubor: [Left:No No] Electronic Signature(s) Signed: 05/02/2023 3:21:42 PM By: [name] Entered By: [name] on 05/02/2023 07:10:45 -------------------------------------------------------------------------------- Multi Wound Chart Details Patient Name: Date of Service: [name]. 05/02/2023 10:00 A M Medical Record Number: [id number] Patient  Account Number: [id number] Date of Birth/Sex: Treating RN: 09/21/1950 (72 y.o. M) Primary Care Jazzlyn Huizenga: [name] Other Clinician: Referring Mikya Don: Treating Destinie Thornsberry/Extender: [name] in Treatment: 107 Vital Signs Height(in): 74 Capillary Blood Glucose(mg/dl): 269 Weight(lbs): 485 Pulse(bpm): 71 Body Mass Index(BMI): 30.6 Blood Pressure(mmHg): 150/80 Temperature(F): 98.2 Respiratory Rate(breaths/min): 18 [18R:Photos:] [id number] Page 3 of 13] Left, Plantar Metatarsal head first Left, Proximal, Lateral Lower Leg Left, Anterior Lower Leg Wound Location: Gradually Appeared Bump Skin T ear/Laceration Wounding Event: 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 Primary Etiology: Extremity Extremity Glaucoma, Sleep Apnea, Glaucoma, Sleep Apnea, Glaucoma, Sleep Apnea, Comorbid  History: Hypertension, Peripheral Arterial Hypertension, Peripheral Arterial Hypertension, Peripheral Arterial Disease, Peripheral Venous Disease, Disease, Peripheral Venous Disease, Disease, Peripheral Venous Disease, Type II Diabetes, Gout, Osteoarthritis, Type II Diabetes, Gout, Osteoarthritis, Type II Diabetes, Gout, Osteoarthritis, Neuropathy Neuropathy Neuropathy 08/23/2020 06/03/2021 05/02/2023 Date Acquired: 107 99 0 Weeks of Treatment: Open Open Open Wound Status: Yes Yes No Wound Recurrence: No Yes No Clustered Wound: N/A 0 N/A Clustered Quantity: 1x2x0.1 1.2x0.7x0.1 0.6x0.9x0.1 Measurements L x W x D (cm) 1.571 0.66 0.424 A (cm) : rea 0.157 0.066 0.042 Volume (cm) : 88.90% 60.00% N/A % Reduction in A rea: 94.40% 95.00% N/A % Reduction in Volume: Grade 2 Full Thickness With Exposed Support Grade 1 Classification: Structures Medium Medium Medium Exudate A mount: Serosanguineous Serous Serous Exudate Type: red, brown amber amber Exudate Color: Epibole Distinct, outline attached Distinct,  outline attached Wound Margin: Large (67-100%) Large (67-100%) Small (1-33%) Granulation A mount: Red Pink Red Granulation Quality: Small (1-33%) Small (1-33%) Large (67-100%) Necrotic A mount: Fat Layer (Subcutaneous Tissue): Yes Fat Layer (Subcutaneous Tissue): Yes Fat Layer (Subcutaneous Tissue): Yes Exposed Structures: Fascia: No Fascia: No Fascia: No Tendon: No Tendon: No Tendon: No Muscle: No Muscle: No Muscle: No Joint: No Joint: No Joint: No Bone: No Bone: No Bone: No Small (1-33%) Small (1-33%) None Epithelialization: Debridement - Excisional Debridement - Excisional Debridement - Excisional Debridement: Pre-procedure Verification/Time Out 10:31 10:31 10:31 Taken: Lidocaine 4% Topical Solution Lidocaine 4% Topical Solution Lidocaine 4% Topical Solution Pain Control: Callus, Subcutaneous, Slough Necrotic/Eschar, Subcutaneous, Subcutaneous, Slough Tissue Debrided: Slough Skin/Subcutaneous Tissue Skin/Subcutaneous Tissue Skin/Subcutaneous Tissue Level: 1.57 0.66 0.42 Debridement A (sq cm): rea Curette Curette Curette Instrument: Minimum Minimum Minimum Bleeding: Pressure Pressure Pressure Hemostasis A chieved: Procedure was tolerated well Procedure was tolerated well Procedure was tolerated well Debridement Treatment Response: 1x2x0.1 1.2x0.7x0.1 0.6x0.9x0.1 Post Debridement Measurements L x W x D (cm) 0.157 0.066 0.042 Post Debridement Volume: (cm) Callus: Yes Scarring: Yes No Abnormalities Noted Periwound Skin Texture: Dry/Scaly: Yes Dry/Scaly: Yes Dry/Scaly: Yes Periwound Skin Moisture: No Abnormalities Noted No Abnormalities Noted Hemosiderin Staining: Yes Periwound Skin Color: No Abnormality No Abnormality No Abnormality Temperature: Debridement Debridement Debridement Procedures Performed: Wound Number: 37 N/A N/A Photos: N/A N/A Left, Medial Foot N/A N/A Wound Location: Trauma N/A N/A Wounding Event: Diabetic Wound/Ulcer of the  Lower N/A N/A Primary Etiology: Extremity Glaucoma, Sleep Apnea, N/A N/A Comorbid History: Hypertension, Peripheral Arterial Disease, Peripheral Venous Disease, Type II Diabetes, Gout, Osteoarthritis, Neuropathy 04/29/2023 N/A N/A Date Acquired: KASEM, HAFELE ([id number]) [id number] Page 4 of 13 0 N/A N/A Weeks of Treatment: Open N/A N/A Wound Status: No N/A N/A Wound Recurrence: No N/A N/A Clustered Wound: N/A N/A N/A Clustered Quantity: 0.5x0.8x0.1 N/A N/A Measurements L x W x D (cm) 0.314 N/A N/A A (cm) : rea 0.031 N/A N/A Volume (cm) : N/A N/A N/A % Reduction in A rea: N/A N/A N/A % Reduction in Volume: Grade 1 N/A N/A Classification: Medium N/A N/A Exudate A mount: Serosanguineous N/A N/A Exudate Type: red, brown N/A N/A Exudate Color: Distinct, outline attached N/A N/A Wound Margin: Large (67-100%) N/A N/A Granulation A mount: Red, Pink N/A N/A Granulation Quality: Small (1-33%) N/A N/A Necrotic A mount: Fat Layer (Subcutaneous Tissue): Yes N/A N/A Exposed Structures: Fascia: No Tendon: No Muscle: No Joint: No Bone: No None N/A N/A Epithelialization: Debridement - Excisional N/A N/A Debridement: Pre-procedure Verification/Time Out 10:31 N/A N/A Taken: Lidocaine 4% Topical Solution N/A N/A Pain Control: Subcutaneous, Slough N/A N/A Tissue Debrided: Skin/Subcutaneous Tissue  N/A N/A Level: 0.31 N/A N/A Debridement A (sq cm): rea Curette N/A N/A Instrument: Minimum N/A N/A Bleeding: Pressure N/A N/A Hemostasis A chieved: Procedure was tolerated well N/A N/A Debridement Treatment Response: 0.5x0.8x0.1 N/A N/A Post Debridement Measurements L x W x D (cm) 0.031 N/A N/A Post Debridement Volume: (cm) Callus: Yes N/A N/A Periwound Skin Texture: No Abnormalities Noted N/A N/A Periwound Skin Moisture: No Abnormalities Noted N/A N/A Periwound Skin Color: No Abnormality N/A N/A Temperature: Debridement N/A  N/A Procedures Performed: Treatment Notes Electronic Signature(s) Signed: 05/02/2023 10:48:16 AM By: [name] MD FACS Entered By: [name] on 05/02/2023 07:48:16 -------------------------------------------------------------------------------- Multi-Disciplinary Care Plan Details Patient Name: Date of Service: [name]. 05/02/2023 10:00 A M Medical Record Number: [id number] Patient Account Number: [id number] Date of Birth/Sex: Treating RN: 1951/06/09 (72 y.o. [name] Primary Care Thula Stewart: [name] Other Clinician: Referring Tymeer Vaquera: Treating Caelen Higinbotham/Extender: [name] in Treatment: 107 Multidisciplinary Care Plan reviewed with physician Active Inactive Venous Leg Ulcer Nursing Diagnoses: Knowledge deficit related to disease process and management Potential for venous Insuffiency (use before diagnosis confirmed) CALE, MAZZOCCHI ([id number]) [phone].pdf Page 5 of 13 Goals: Patient will maintain optimal edema control Date Initiated: 07/27/2021 Target Resolution Date: 06/21/2023 Goal Status: Active Interventions: Assess peripheral edema status every visit. Treatment Activities: Therapeutic compression applied : 07/27/2021 Notes: Wound/Skin Impairment Nursing Diagnoses: Impaired tissue integrity Knowledge deficit related to ulceration/compromised skin integrity Goals: Patient will have a decrease in wound volume by X% from date: (specify in notes) Date Initiated: 04/12/2021 Date Inactivated: 01/04/2022 Target Resolution Date: 04/23/2021 Goal Status: Met Patient/caregiver will verbalize understanding of skin care regimen Date Initiated: 01/04/2022 Target Resolution Date: 06/21/2023 Goal Status: Active Ulcer/skin breakdown will have a volume reduction of 30% by week 4 Date Initiated: 04/12/2021 Date Inactivated: 04/27/2021 Target Resolution Date: 04/27/2021 Goal Status: Unmet Unmet Reason:  infection Ulcer/skin breakdown will have a volume reduction of 50% by week 8 Date Initiated: 04/27/2021 Date Inactivated: 06/29/2021 Target Resolution Date: 06/24/2021 Goal Status: Met Interventions: Assess patient/caregiver ability to obtain necessary supplies Assess patient/caregiver ability to perform ulcer/skin care regimen upon admission and as needed Assess ulceration(s) every visit Notes: Electronic Signature(s) Signed: 05/02/2023 3:21:42 PM By: [name] Entered By: [name] on 05/02/2023 07:48:58 -------------------------------------------------------------------------------- Pain Assessment Details Patient Name: Date of Service: [name]. 05/02/2023 10:00 A M Medical Record Number: [id number] Patient Account Number: [id number] Date of Birth/Sex: Treating RN: September 25, 1950 (72 y.o. [name] Primary Care Rivkah Wolz: [name] Other Clinician: Referring Daevion Navarette: Treating Bernon Arviso/Extender: [name] in Treatment: 107 Active Problems Location of Pain Severity and Description of Pain Patient Has Paino No Site Locations Rate the pain. CASSIUS, KABLE ([id number]) [id number] Page 6 of 13 Rate the pain. Current Pain Level: 0 Pain Management and Medication Current Pain Management: Electronic Signature(s) Signed: 05/02/2023 3:21:42 PM By: [name] Entered By: [name] on 05/02/2023 07:08:05 -------------------------------------------------------------------------------- Patient/Caregiver Education Details Patient Name: Date of Service: Jeziorski, [name] 11/7/2024andnbsp10:00 A M Medical Record Number: [id number] Patient Account Number: [id number] Date of Birth/Gender: Treating RN: 08-12-1950 (72 y.o. [name] Primary Care Physician: [name] Other Clinician: Referring Physician: Treating Physician/Extender: [name] in  Treatment: 107 Education Assessment Education Provided To: Patient Education Topics Provided Wound/Skin Impairment: Methods: Explain/Verbal Responses: Reinforcements needed, State content correctly Electronic Signature(s) Signed: 05/02/2023 3:21:42 PM By: [name] Entered By: [name] on 05/02/2023 07:49:10 -------------------------------------------------------------------------------- Wound Assessment Details Patient Name: Date  of Service: IZSAK, HARTUNIAN 05/02/2023 10:00 A M Medical Record Number: [id number] Patient Account Number: [id number] Date of Birth/Sex: Treating RN: 10-Aug-1950 (72 y.o. [name] Primary Care Xavier Munger: [name] Other Clinician: Referring Dontez Hauss: Treating Anuoluwapo Mefferd/Extender: Azariyah, Ramus, Dola Factor ([id number]) [id number] Page 7 of 13 Weeks in Treatment: 107 Wound Status Wound Number: 18R Primary Diabetic Wound/Ulcer of the Lower Extremity Etiology: Wound Location: Left, Plantar Metatarsal head first Wound Open Wounding Event: Gradually Appeared Status: Date Acquired: 08/23/2020 Comorbid Glaucoma, Sleep Apnea, Hypertension, Peripheral Arterial Disease, Weeks Of Treatment: 107 History: Peripheral Venous Disease, Type II Diabetes, Gout, Osteoarthritis, Clustered Wound: No Neuropathy Photos Wound Measurements Length: (cm) 1 Width: (cm) 2 Depth: (cm) 0.1 Area: (cm) 1.571 Volume: (cm) 0.157 % Reduction in Area: 88.9% % Reduction in Volume: 94.4% Epithelialization: Small (1-33%) Tunneling: No Undermining: No Wound Description Classification: Grade 2 Wound Margin: Epibole Exudate Amount: Medium Exudate Type: Serosanguineous Exudate Color: red, brown Foul Odor After Cleansing: No Slough/Fibrino Yes Wound Bed Granulation Amount: Large (67-100%) Exposed Structure Granulation Quality: Red Fascia Exposed: No Necrotic Amount: Small (1-33%) Fat Layer (Subcutaneous  Tissue) Exposed: Yes Necrotic Quality: Adherent Slough Tendon Exposed: No Muscle Exposed: No Joint Exposed: No Bone Exposed: No Periwound Skin Texture Texture Color No Abnormalities Noted: No No Abnormalities Noted: Yes Callus: Yes Temperature / Pain Temperature: No Abnormality Moisture No Abnormalities Noted: Yes Treatment Notes Wound #18R (Metatarsal head first) Wound Laterality: Plantar, Left Cleanser Soap and Water Discharge Instruction: May shower and wash wound with dial antibacterial soap and water prior to dressing change. Wound Cleanser Discharge Instruction: Cleanse the wound with wound cleanser prior to applying a clean dressing using gauze sponges, not tissue or cotton balls. Peri-Wound Care Topical Primary Dressing Endoform 2x2 in Discharge Instruction: Moisten with saline Secondary Dressing MAMORU, HERRIG ([id number]) [phone].pdf Page 8 of 13 Optifoam Non-Adhesive Dressing, 4x4 in Discharge Instruction: Apply over primary dressing as directed. Woven Gauze Sponges 2x2 in Discharge Instruction: Apply over primary dressing as directed. Secured With 5M Medipore H Soft Cloth Surgical T ape, 4 x 10 (in/yd) Discharge Instruction: Secure with tape as directed. Compression Wrap Compression Stockings Add-Ons Electronic Signature(s) Signed: 05/02/2023 3:21:42 PM By: [name] Signed: 05/08/2023 2:35:12 PM By: Tommie Ard RN Entered By: Tommie Ard on 05/02/2023 07:18:29 -------------------------------------------------------------------------------- Wound Assessment Details Patient Name: Date of Service: [name]. 05/02/2023 10:00 A M Medical Record Number: [id number] Patient Account Number: [id number] Date of Birth/Sex: Treating RN: [birth year] (72 y.o. [name] Primary Care Velencia Lenart: [name] Other Clinician: Referring Rilya Longo: Treating Nakiya Rallis/Extender: [name] in  Treatment: 107 Wound Status Wound Number: 22R Primary Cyst Etiology: Wound Location: Left, Proximal, Lateral Lower Leg Wound Open Wounding Event: Bump Status: Date Acquired: 06/03/2021 Comorbid Glaucoma, Sleep Apnea, Hypertension, Peripheral Arterial Disease, Weeks Of Treatment: 99 History: Peripheral Venous Disease, Type II Diabetes, Gout, Osteoarthritis, Clustered Wound: Yes Neuropathy Photos Wound Measurements Length: (cm) Width: (cm) Depth: (cm) Clustered Quantity: Area: (cm) Volume: (cm) 1.2 % Reduction in Area: 60% 0.7 % Reduction in Volume: 95% 0.1 Epithelialization: Small (1-33%) 0 Tunneling: No 0.66 Undermining: No 0.066 Wound Description Classification: Full Thickness With Exposed Support Structures Wound Margin: Distinct, outline attached Exudate Amount: Medium Exudate Type: Serous Exudate Color: amber SHAYQUAN, NEWSTROM ([id number]) Wound Bed Granulation Amount: Large (67-100%) Granulation Quality: Pink Necrotic Amount: Small (1-33%) Necrotic Quality: Adherent Slough Foul Odor After Cleansing: No Slough/Fibrino Yes [id number] Page 9 of 13 Exposed Structure Fascia Exposed: No Fat  Layer (Subcutaneous Tissue) Exposed: Yes Tendon Exposed: No Muscle Exposed: No Joint Exposed: No Bone Exposed: No Periwound Skin Texture Texture Color No Abnormalities Noted: No No Abnormalities Noted: Yes Scarring: Yes Temperature / Pain Temperature: No Abnormality Moisture No Abnormalities Noted: Yes Treatment Notes Wound #22R (Lower Leg) Wound Laterality: Left, Lateral, Proximal Cleanser Soap and Water Discharge Instruction: May shower and wash wound with dial antibacterial soap and water prior to dressing change. Wound Cleanser Discharge Instruction: Cleanse the wound with wound cleanser prior to applying a clean dressing using gauze sponges, not tissue or cotton balls. Peri-Wound Care Topical Skintegrity Hydrogel 4 (oz) Discharge  Instruction: Apply hydrogel as directed Primary Dressing Endoform 2x2 in Discharge Instruction: Moisten with saline Secondary Dressing Woven Gauze Sponge, Non-Sterile 4x4 in Discharge Instruction: Apply over primary dressing as directed. Secured With 48M Medipore H Soft Cloth Surgical T ape, 4 x 10 (in/yd) Discharge Instruction: Secure with tape as directed. Compression Wrap Compression Stockings Add-Ons Electronic Signature(s) Signed: 05/02/2023 3:21:42 PM By: [name] Signed: 05/08/2023 2:35:12 PM By: Tommie Ard RN Entered By: Tommie Ard on 05/02/2023 07:18:56 -------------------------------------------------------------------------------- Wound Assessment Details Patient Name: Date of Service: [name]. 05/02/2023 10:00 A M Medical Record Number: [id number] Patient Account Number: [id number] Date of Birth/Sex: Treating RN: 1951-04-24 (72 y.o. [name] Primary Care Emmogene Simson: [name] Other Clinician: Referring Dylin Ihnen: Treating Kaiesha Tonner/Extender: [name] in Treatment: 107 Wound Status Wound Number: 36 Primary Diabetic Wound/Ulcer of the Lower Extremity Etiology: HERCHELL, MARCEAU ([id number]) [id number] Page 10 of 13 Etiology: Wound Location: Left, Anterior Lower Leg Wound Open Wounding Event: Skin Tear/Laceration Status: Date Acquired: 05/02/2023 Comorbid Glaucoma, Sleep Apnea, Hypertension, Peripheral Arterial Disease, Weeks Of Treatment: 0 History: Peripheral Venous Disease, Type II Diabetes, Gout, Osteoarthritis, Clustered Wound: No Neuropathy Photos Wound Measurements Length: (cm) 0.6 Width: (cm) 0.9 Depth: (cm) 0.1 Area: (cm) 0.424 Volume: (cm) 0.042 % Reduction in Area: % Reduction in Volume: Epithelialization: None Tunneling: No Undermining: No Wound Description Classification: Grade 1 Wound Margin: Distinct, outline attached Exudate Amount: Medium Exudate Type:  Serous Exudate Color: amber Foul Odor After Cleansing: No Slough/Fibrino Yes Wound Bed Granulation Amount: Small (1-33%) Exposed Structure Granulation Quality: Red Fascia Exposed: No Necrotic Amount: Large (67-100%) Fat Layer (Subcutaneous Tissue) Exposed: Yes Necrotic Quality: Adherent Slough Tendon Exposed: No Muscle Exposed: No Joint Exposed: No Bone Exposed: No Periwound Skin Texture Texture Color No Abnormalities Noted: Yes No Abnormalities Noted: No Hemosiderin Staining: Yes Moisture No Abnormalities Noted: No Temperature / Pain Dry / Scaly: Yes Temperature: No Abnormality Treatment Notes Wound #36 (Lower Leg) Wound Laterality: Left, Anterior Cleanser Soap and Water Discharge Instruction: May shower and wash wound with dial antibacterial soap and water prior to dressing change. Wound Cleanser Discharge Instruction: Cleanse the wound with wound cleanser prior to applying a clean dressing using gauze sponges, not tissue or cotton balls. Peri-Wound Care Topical Primary Dressing Maxorb Extra Ag+ Alginate Dressing, 2x2 (in/in) Discharge Instruction: Apply to wound bed as instructed Secondary Dressing Zetuvit Plus Silicone Border Dressing 3x3 (in/in) Discharge Instruction: Apply silicone border over primary dressing as directed. Secured With [name] ([id number]) [phone].pdf Page 11 of 13 Compression Wrap Compression Stockings Add-Ons Electronic Signature(s) Signed: 05/02/2023 3:21:42 PM By: [name] Signed: 05/08/2023 2:35:12 PM By: Tommie Ard RN Entered By: Tommie Ard on 05/02/2023 07:19:21 -------------------------------------------------------------------------------- Wound Assessment Details Patient Name: Date of Service: [name]. 05/02/2023 10:00 A M Medical Record Number: [id number] Patient Account Number: [id number] Date of Birth/Sex:  Treating RN: August 17, 1950 (72 y.o. [name] Primary Care  Aleecia Tapia: [name] Other Clinician: Referring Manuella Blackson: Treating Dashel Goines/Extender: [name] in Treatment: 107 Wound Status Wound Number: 37 Primary Diabetic Wound/Ulcer of the Lower Extremity Etiology: Wound Location: Left, Medial Foot Wound Open Wounding Event: Trauma Status: Date Acquired: 04/29/2023 Comorbid Glaucoma, Sleep Apnea, Hypertension, Peripheral Arterial Disease, Weeks Of Treatment: 0 History: Peripheral Venous Disease, Type II Diabetes, Gout, Osteoarthritis, Clustered Wound: No Neuropathy Photos Wound Measurements Length: (cm) 0.5 Width: (cm) 0.8 Depth: (cm) 0.1 Area: (cm) 0.314 Volume: (cm) 0.031 % Reduction in Area: % Reduction in Volume: Epithelialization: None Tunneling: No Undermining: No Wound Description Classification: Grade 1 Wound Margin: Distinct, outline attached Exudate Amount: Medium Exudate Type: Serosanguineous Exudate Color: red, brown Foul Odor After Cleansing: No Slough/Fibrino Yes Wound Bed Granulation Amount: Large (67-100%) Exposed Structure Granulation Quality: Red, Pink Fascia Exposed: No Necrotic Amount: Small (1-33%) Fat Layer (Subcutaneous Tissue) Exposed: Yes Necrotic Quality: Adherent Slough Tendon Exposed: No Muscle Exposed: No Joint Exposed: No Bone Exposed: No [address] CORBYN, TRITCH ([id number]) [id number] Page 12 of 13 Texture Color No Abnormalities Noted: No No Abnormalities Noted: Yes Callus: Yes Temperature / Pain Temperature: No Abnormality Moisture No Abnormalities Noted: Yes Treatment Notes Wound #37 (Foot) Wound Laterality: Left, Medial Cleanser Soap and Water Discharge Instruction: May shower and wash wound with dial antibacterial soap and water prior to dressing change. Wound Cleanser Discharge Instruction: Cleanse the wound with wound cleanser prior to applying a clean dressing using gauze sponges, not tissue or cotton  balls. Peri-Wound Care Topical Primary Dressing Endoform 2x2 in Discharge Instruction: Moisten with saline Secondary Dressing Optifoam Non-Adhesive Dressing, 4x4 in Discharge Instruction: Apply over primary dressing as directed. Woven Gauze Sponges 2x2 in Discharge Instruction: Apply over primary dressing as directed. Secured With 69M Medipore H Soft Cloth Surgical T ape, 4 x 10 (in/yd) Discharge Instruction: Secure with tape as directed. Compression Wrap Compression Stockings Add-Ons Electronic Signature(s) Signed: 05/02/2023 3:21:42 PM By: [name] Signed: 05/08/2023 2:35:12 PM By: Tommie Ard RN Entered By: Tommie Ard on 05/02/2023 07:19:44 -------------------------------------------------------------------------------- Vitals Details Patient Name: Date of Service: [name]. 05/02/2023 10:00 A M Medical Record Number: [id number] Patient Account Number: [id number] Date of Birth/Sex: Treating RN: 1951-02-09 (72 y.o. [name] Primary Care Winnell Bento: [name] Other Clinician: Referring Trevelle Mcgurn: Treating Dealva Lafoy/Extender: [name] in Treatment: 107 Vital Signs Time Taken: 10:09 Temperature (F): 98.2 Height (in): 74 Pulse (bpm): 71 Weight (lbs): 238 Respiratory Rate (breaths/min): 18 Body Mass Index (BMI): 30.6 Blood Pressure (mmHg): 150/80 Capillary Blood Glucose (mg/dl): 664 Reference Range: 80 - 120 mg / dl Electronic Signature(s) Signed: 05/02/2023 3:21:42 PM By: [name] Entered By: [name] on 05/02/2023 07:09:36 [name] ([id number]) [id number] Page 13 of 13

## 2023-05-09 ENCOUNTER — Encounter (HOSPITAL_BASED_OUTPATIENT_CLINIC_OR_DEPARTMENT_OTHER): Payer: Medicare Other | Admitting: General Surgery

## 2023-05-09 DIAGNOSIS — I87332 Chronic venous hypertension (idiopathic) with ulcer and inflammation of left lower extremity: Secondary | ICD-10-CM | POA: Diagnosis not present

## 2023-05-09 NOTE — Progress Notes (Addendum)
[name], [name] ([id number]) [id number] Page 1 of 12 Visit Report for 05/09/2023 Arrival Information Details Patient Name: Date of Service: [name], [name] 05/09/2023 10:00 A M Medical Record Number: [id number] Patient Account Number: [id number] Date of Birth/Sex: Treating RN: 1951-04-30 (72 y.o. M) Primary Care Gerrianne Aydelott: [name] Other Clinician: Referring Jacquelina Hewins: Treating Shahrzad Koble/Extender: [name] in Treatment: 108 Visit Information History Since Last Visit Added or deleted any medications: No Patient Arrived: Ambulatory Any new allergies or adverse reactions: No Arrival Time: 10:03 Had a fall or experienced change in No Accompanied By: Self activities of daily living that may affect Transfer Assistance: None risk of falls: Patient Identification Verified: Yes Signs or symptoms of abuse/neglect since last visito No Secondary Verification Process Completed: Yes Hospitalized since last visit: No Patient Requires Transmission-Based Precautions: No Implantable device outside of the clinic excluding No Patient Has Alerts: Yes cellular tissue based products placed in the center since last visit: Pain Present Now: No Electronic Signature(s) Signed: 05/09/2023 10:15:18 AM By: Dayton Scrape Entered By: Dayton Scrape on 05/09/2023 07:03:33 -------------------------------------------------------------------------------- Encounter Discharge Information Details Patient Name: Date of Service: [name]. 05/09/2023 10:00 A M Medical Record Number: [id number] Patient Account Number: [id number] Date of Birth/Sex: Treating RN: 05-Dec-1950 (72 y.o. [name] Primary Care Shakiya Mcneary: [name] Other Clinician: Referring Vanda Waskey: Treating Issabelle Mcraney/Extender: [name] in Treatment: [phone] Encounter Discharge Information Items Post Procedure Vitals Discharge Condition: Stable Temperature (F):  98.5 Ambulatory Status: Ambulatory Pulse (bpm): 58 Discharge Destination: Home Respiratory Rate (breaths/min): 18 Transportation: Private Auto Blood Pressure (mmHg): 148/82 Accompanied By: self Schedule Follow-up Appointment: Yes Clinical Summary of Care: Patient Declined Electronic Signature(s) Signed: 05/09/2023 2:49:10 PM By: [name] Entered By: [name] on 05/09/2023 07:39:57 [name] ([id number]) [id number] Page 2 of 12 -------------------------------------------------------------------------------- Lower Extremity Assessment Details Patient Name: Date of Service: [name], [name] 05/09/2023 10:00 A M Medical Record Number: [id number] Patient Account Number: [id number] Date of Birth/Sex: Treating RN: May 25, 1951 (72 y.o. [name] Primary Care Ahmod Gillespie: [name] Other Clinician: Referring Nussen Pullin: Treating Yeira Gulden/Extender: [name] in Treatment: 108 Edema Assessment Assessed: [name]: No] [name]: No] Edema: [Left: Ye] [Right: s] Calf Left: Right: Point of Measurement: 41 cm From Medial Instep 43.3 cm Ankle Left: Right: Point of Measurement: 10 cm From Medial Instep 30.5 cm Vascular Assessment Pulses: Dorsalis Pedis Palpable: [Left:Yes] Extremity colors, hair growth, and conditions: Extremity Color: [Left:Hyperpigmented] Hair Growth on Extremity: [Left:No] Temperature of Extremity: [Left:Warm] Capillary Refill: [Left:< 3 seconds] Dependent Rubor: [Left:No No] Electronic Signature(s) Signed: 05/09/2023 2:49:10 PM By: [name] Entered By: [name] on 05/09/2023 07:19:30 -------------------------------------------------------------------------------- Multi Wound Chart Details Patient Name: Date of Service: [name]. 05/09/2023 10:00 A M Medical Record Number: [id number] Patient Account Number: [id number] Date of Birth/Sex: Treating  RN: [birth year] (72 y.o. 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 Primary Etiology: Extremity Extremity Glaucoma, Sleep Apnea, Glaucoma, Sleep Apnea, Glaucoma, Sleep Apnea, Comorbid History: Hypertension, Peripheral Arterial Hypertension, Peripheral Arterial Hypertension, Peripheral  Arterial Disease, Peripheral Venous Disease, Disease, Peripheral Venous Disease, Disease, Peripheral Venous Disease, Type II Diabetes, Gout, Osteoarthritis, Type II Diabetes, Gout, Osteoarthritis, Type II Diabetes, Gout, Osteoarthritis, Neuropathy Neuropathy Neuropathy 08/23/2020 06/03/2021 05/02/2023 Date Acquired: 108 100 1 Weeks of Treatment: Open Open Open Wound Status: Yes Yes No Wound Recurrence: No Yes No Clustered Wound: N/A 0 N/A Clustered Quantity: 1.9x1.7x0.1 1.3x0.6x0.1 0.5x0.5x0.1 Measurements L x W x D (cm) 2.537 0.613 0.196 A (cm) : rea 0.254 0.061 0.02 Volume (cm) : 82.10% 62.80% 53.80% % Reduction in A rea: 91.00% 95.40% 52.40% % Reduction in Volume: Grade 2 Full Thickness With Exposed Support Grade 1 Classification: Structures Medium Medium Medium Exudate A mount: Serosanguineous Serous Serous Exudate Type: red, brown amber amber Exudate Color: Epibole Distinct, outline attached Distinct, outline attached Wound Margin: Large  (67-100%) Large (67-100%) Small (1-33%) Granulation A mount: Red Pink Red Granulation Quality: Small (1-33%) Small (1-33%) Large (67-100%) Necrotic A mount: Fat Layer (Subcutaneous Tissue): Yes Fat Layer (Subcutaneous Tissue): Yes Fat Layer (Subcutaneous Tissue): Yes Exposed Structures: Fascia: No Fascia: No Fascia: No Tendon: No Tendon: No Tendon: No Muscle: No Muscle: No Muscle: No Joint: No Joint: No Joint: No Bone: No Bone: No Bone: No Small (1-33%) Small (1-33%) None Epithelialization: Debridement - Excisional Debridement - Excisional Debridement - Excisional Debridement: Pre-procedure Verification/Time Out 10:25 10:25 10:25 Taken: Lidocaine 4% Topical Solution Lidocaine 4% Topical Solution Lidocaine 4% Topical Solution Pain Control: Callus, Subcutaneous, Slough Subcutaneous, Slough Subcutaneous, Slough Tissue Debrided: Skin/Subcutaneous Tissue Skin/Subcutaneous Tissue Skin/Subcutaneous Tissue Level: 2.54 0.61 0.2 Debridement A (sq cm): rea Curette Curette Curette Instrument: Minimum Minimum Minimum Bleeding: Pressure Pressure Pressure Hemostasis A chieved: Procedure was tolerated well Procedure was tolerated well Procedure was tolerated well Debridement Treatment Response: 1.9x1.7x0.1 1.3x0.6x0.1 0.5x0.5x0.1 Post Debridement Measurements L x W x D (cm) 0.254 0.061 0.02 Post Debridement Volume: (cm) Callus: Yes Scarring: Yes No Abnormalities Noted Periwound Skin Texture: Dry/Scaly: Yes Dry/Scaly: Yes Dry/Scaly: Yes Periwound Skin Moisture: No Abnormalities Noted No Abnormalities Noted Hemosiderin Staining: Yes Periwound Skin Color: No Abnormality No Abnormality No Abnormality Temperature: Debridement Debridement Debridement Procedures Performed: Wound Number: 37 N/A N/A Photos: N/A N/A Left, Medial Foot N/A N/A Wound Location: Trauma N/A N/A Wounding Event: Diabetic Wound/Ulcer of the Lower N/A N/A Primary Etiology: Extremity Glaucoma,  Sleep Apnea, N/A N/A Comorbid History: Hypertension, Peripheral Arterial Disease, Peripheral Venous Disease, Type II Diabetes, Gout, Osteoarthritis, Neuropathy 04/29/2023 N/A N/A Date Acquired: 1 N/A N/A Weeks of Treatment: [name] ([id number]) [id number] Page 4 of 12 Open N/A N/A Wound Status: No N/A N/A Wound Recurrence: No N/A N/A Clustered Wound: N/A N/A N/A Clustered Quantity: 1x1.3x0.1 N/A N/A Measurements L x W x D (cm) 1.021 N/A N/A A (cm) : rea 0.102 N/A N/A Volume (cm) : -225.20% N/A N/A % Reduction in A rea: -229.00% N/A N/A % Reduction in Volume: Grade 1 N/A N/A Classification: Medium N/A N/A Exudate A mount: Serosanguineous N/A N/A Exudate Type: red, brown N/A N/A Exudate Color: Distinct, outline attached N/A N/A Wound Margin: Large (67-100%) N/A N/A Granulation A mount: Red, Pink N/A N/A Granulation Quality: Small (1-33%) N/A N/A Necrotic A mount: Fat Layer (Subcutaneous Tissue): Yes N/A N/A Exposed Structures: Fascia: No Tendon: No Muscle: No Joint: No Bone: No None N/A N/A Epithelialization: Debridement - Excisional N/A N/A Debridement: Pre-procedure Verification/Time Out 10:25 N/A N/A Taken: Lidocaine 4% Topical Solution N/A N/A Pain Control: Subcutaneous, Slough N/A N/A Tissue Debrided: Skin/Subcutaneous Tissue N/A N/A Level: 1.02 N/A N/A Debridement A (sq cm):  rea Curette N/A N/A Instrument: Minimum N/A N/A Bleeding: Pressure N/A N/A Hemostasis A chieved: Procedure was tolerated well N/A N/A Debridement Treatment Response: 1x1.3x0.1 N/A N/A Post Debridement Measurements L x W x D (cm) 0.102 N/A N/A Post Debridement Volume: (cm) Callus: Yes N/A N/A Periwound Skin Texture: No Abnormalities Noted N/A N/A Periwound Skin Moisture: No Abnormalities Noted N/A N/A Periwound Skin Color: No Abnormality N/A N/A Temperature: Debridement N/A N/A Procedures Performed: Treatment  Notes Electronic Signature(s) Signed: 05/09/2023 10:32:49 AM By: [name] MD FACS Entered By: [name] on 05/09/2023 07:32:49 -------------------------------------------------------------------------------- Multi-Disciplinary Care Plan Details Patient Name: Date of Service: [name]. 05/09/2023 10:00 A M Medical Record Number: [id number] Patient Account Number: [id number] Date of Birth/Sex: Treating RN: 21-Dec-1950 (72 y.o. [name] Primary Care Talissa Apple: [name] Other Clinician: Referring Dyllin Gulley: Treating Ayaan Shutes/Extender: [name] in Treatment: 108 Multidisciplinary Care Plan reviewed with physician Active Inactive Venous Leg Ulcer Nursing Diagnoses: Knowledge deficit related to disease process and management Potential for venous Insuffiency (use before diagnosis confirmed) Goals: [name] ([id number]) [id number] Page 5 of 12 Patient will maintain optimal edema control Date Initiated: 07/27/2021 Target Resolution Date: 06/21/2023 Goal Status: Active Interventions: Assess peripheral edema status every visit. Treatment Activities: Therapeutic compression applied : 07/27/2021 Notes: Wound/Skin Impairment Nursing Diagnoses: Impaired tissue integrity Knowledge deficit related to ulceration/compromised skin integrity Goals: Patient will have a decrease in wound volume by X% from date: (specify in notes) Date Initiated: 04/12/2021 Date Inactivated: 01/04/2022 Target Resolution Date: 04/23/2021 Goal Status: Met Patient/caregiver will verbalize understanding of skin care regimen Date Initiated: 01/04/2022 Target Resolution Date: 06/21/2023 Goal Status: Active Ulcer/skin breakdown will have a volume reduction of 30% by week 4 Date Initiated: 04/12/2021 Date Inactivated: 04/27/2021 Target Resolution Date: 04/27/2021 Goal Status: Unmet Unmet Reason: infection Ulcer/skin breakdown  will have a volume reduction of 50% by week 8 Date Initiated: 04/27/2021 Date Inactivated: 06/29/2021 Target Resolution Date: 06/24/2021 Goal Status: Met Interventions: Assess patient/caregiver ability to obtain necessary supplies Assess patient/caregiver ability to perform ulcer/skin care regimen upon admission and as needed Assess ulceration(s) every visit Notes: Electronic Signature(s) Signed: 05/09/2023 2:49:10 PM By: [name] Entered By: [name] on 05/09/2023 07:27:04 -------------------------------------------------------------------------------- Pain Assessment Details Patient Name: Date of Service: [name]. 05/09/2023 10:00 A M Medical Record Number: [id number] Patient Account Number: [id number] Date of Birth/Sex: Treating RN: 1951-05-27 (72 y.o. [name] Primary Care Fumiye Lubben: [name] Other Clinician: Referring Merry Pond: Treating Sevilla Murtagh/Extender: [name] in Treatment: [phone] Active Problems Location of Pain Severity and Description of Pain Patient Has Paino No Site Locations Rate the pain. CRUZITO, VAUGHNS ([id number]) [id number] Page 6 of 12 Rate the pain. Current Pain Level: 0 Pain Management and Medication Current Pain Management: Electronic Signature(s) Signed: 05/09/2023 2:49:10 PM By: [name] Entered By: [name] on 05/09/2023 07:18:58 -------------------------------------------------------------------------------- Patient/Caregiver Education Details Patient Name: Date of Service: [place], [name] 11/14/2024andnbsp10:00 A M Medical Record Number: [id number] Patient Account Number: [id number] Date of Birth/Gender: Treating RN: 1951-02-06 (73 y.o. [name] Primary Care Physician: [name] Other Clinician: Referring Physician: Treating Physician/Extender: [name] in Treatment: 108 Education  Assessment Education Provided To: Patient Education Topics Provided Offloading: Methods: Explain/Verbal Responses: Reinforcements needed, State content correctly Electronic Signature(s) Signed: 05/09/2023 2:49:10 PM By: [name] Entered By: [name] on 05/09/2023 07:28:22 -------------------------------------------------------------------------------- Wound Assessment Details Patient Name: Date of Service: [name]. 05/09/2023 10:00 A M Medical  Record Number: [id number] Patient Account Number: [id number] Date of Birth/Sex: Treating RN: [birth year] (72 y.o. M) Primary Care Kristalynn Coddington: [name] Other Clinician: Referring Devarion Mcclanahan: Treating Breezy Hertenstein/Extender: Liangelo, Muckleroy, Dola Factor ([id number]) [id number] Page 7 of 12 Weeks in Treatment: 108 Wound Status Wound Number: 18R Primary Diabetic Wound/Ulcer of the Lower Extremity Etiology: Wound Location: Left, Plantar Metatarsal head first Wound Open Wounding Event: Gradually Appeared Status: Date Acquired: 08/23/2020 Comorbid Glaucoma, Sleep Apnea, Hypertension, Peripheral Arterial Disease, Weeks Of Treatment: 108 History: Peripheral Venous Disease, Type II Diabetes, Gout, Osteoarthritis, Clustered Wound: No Neuropathy Photos Wound Measurements Length: (cm) 1.9 Width: (cm) 1.7 Depth: (cm) 0.1 Area: (cm) 2.537 Volume: (cm) 0.254 % Reduction in Area: 82.1% % Reduction in Volume: 91% Epithelialization: Small (1-33%) Tunneling: No Undermining: No Wound Description Classification: Grade 2 Wound Margin: Epibole Exudate Amount: Medium Exudate Type: Serosanguineous Exudate Color: red, brown Foul Odor After Cleansing: No Slough/Fibrino Yes Wound Bed Granulation Amount: Large (67-100%) Exposed Structure Granulation Quality: Red Fascia Exposed: No Necrotic Amount: Small (1-33%) Fat Layer (Subcutaneous Tissue) Exposed: Yes Necrotic Quality: Adherent  Slough Tendon Exposed: No Muscle Exposed: No Joint Exposed: No Bone Exposed: No Periwound Skin Texture Texture Color No Abnormalities Noted: No No Abnormalities Noted: Yes Callus: Yes Temperature / Pain Temperature: No Abnormality Moisture No Abnormalities Noted: Yes Treatment Notes Wound #18R (Metatarsal head first) Wound Laterality: Plantar, Left Cleanser Soap and Water Discharge Instruction: May shower and wash wound with dial antibacterial soap and water prior to dressing change. Wound Cleanser Discharge Instruction: Cleanse the wound with wound cleanser prior to applying a clean dressing using gauze sponges, not tissue or cotton balls. Peri-Wound Care Topical Primary Dressing Endoform 2x2 in Discharge Instruction: Moisten with saline Secondary Dressing TRILLION, RENICKER ([id number]) [phone].pdf Page 8 of 12 Optifoam Non-Adhesive Dressing, 4x4 in Discharge Instruction: Apply over primary dressing as directed. Woven Gauze Sponges 2x2 in Discharge Instruction: Apply over primary dressing as directed. Secured With 38M Medipore H Soft Cloth Surgical T ape, 4 x 10 (in/yd) Discharge Instruction: Secure with tape as directed. Compression Wrap Compression Stockings Add-Ons Electronic Signature(s) Signed: 05/09/2023 2:49:10 PM By: [name] Previous Signature: 05/09/2023 10:15:18 AM Version By: Dayton Scrape Entered By: [name] on 05/09/2023 07:20:40 -------------------------------------------------------------------------------- Wound Assessment Details Patient Name: Date of Service: [name]. 05/09/2023 10:00 A M Medical Record Number: [id number] Patient Account Number: [id number] Date of Birth/Sex: Treating RN: 1951/05/06 (72 y.o. M) Primary Care Rachid Parham: [name] Other Clinician: Referring Kemper Heupel: Treating Mate Alegria/Extender: [name] in Treatment: 108 Wound Status Wound Number: 22R  Primary Cyst Etiology: Wound Location: Left, Proximal, Lateral Lower Leg Wound Open Wounding Event: Bump Status: Date Acquired: 06/03/2021 Comorbid Glaucoma, Sleep Apnea, Hypertension, Peripheral Arterial Disease, Weeks Of Treatment: 100 History: Peripheral Venous Disease, Type II Diabetes, Gout, Osteoarthritis, Clustered Wound: Yes Neuropathy Photos Wound Measurements Length: (cm) Width: (cm) Depth: (cm) Clustered Quantity: Area: (cm) Volume: (cm) 1.3 % Reduction in Area: 62.8% 0.6 % Reduction in Volume: 95.4% 0.1 Epithelialization: Small (1-33%) 0 Tunneling: No 0.613 Undermining: No 0.061 Wound Description Classification: Full Thickness With Exposed Support Structures Wound Margin: Distinct, outline attached Exudate Amount: Medium Exudate Type: Serous Exudate Color: amber NORBERTO, PROFFITT ([id number]) Wound Bed Granulation Amount: Large (67-100%) Granulation Quality: Pink Necrotic Amount: Small (1-33%) Necrotic Quality: Adherent Slough Foul Odor After Cleansing: No Slough/Fibrino Yes [id number] Page 9 of 12 Exposed Structure Fascia Exposed: No Fat Layer (Subcutaneous Tissue) Exposed: Yes Tendon Exposed: No Muscle Exposed: No Joint Exposed: No  Bone Exposed: No Periwound Skin Texture Texture Color No Abnormalities Noted: No No Abnormalities Noted: Yes Scarring: Yes Temperature / Pain Temperature: No Abnormality Moisture No Abnormalities Noted: Yes Treatment Notes Wound #22R (Lower Leg) Wound Laterality: Left, Lateral, Proximal Cleanser Soap and Water Discharge Instruction: May shower and wash wound with dial antibacterial soap and water prior to dressing change. Wound Cleanser Discharge Instruction: Cleanse the wound with wound cleanser prior to applying a clean dressing using gauze sponges, not tissue or cotton balls. Peri-Wound Care Topical Primary Dressing Maxorb Extra Ag+ Alginate Dressing, 2x2 (in/in) Discharge  Instruction: Apply to wound bed as instructed Secondary Dressing Woven Gauze Sponge, Non-Sterile 4x4 in Discharge Instruction: Apply over primary dressing as directed. Secured With 31M Medipore H Soft Cloth Surgical T ape, 4 x 10 (in/yd) Discharge Instruction: Secure with tape as directed. Compression Wrap Compression Stockings Add-Ons Electronic Signature(s) Signed: 05/09/2023 2:49:10 PM By: [name] Previous Signature: 05/09/2023 10:15:18 AM Version By: Dayton Scrape Entered By: [name] on 05/09/2023 07:21:02 -------------------------------------------------------------------------------- Wound Assessment Details Patient Name: Date of Service: [name]. 05/09/2023 10:00 A M Medical Record Number: [id number] Patient Account Number: [id number] Date of Birth/Sex: Treating RN: [birth year] (72 y.o. M) Primary Care Destyni Hoppel: [name] Other Clinician: Referring Samba Cumba: Treating Dereon Corkery/Extender: [name] in Treatment: 108 Wound Status Wound Number: 36 Primary Diabetic Wound/Ulcer of the Lower Extremity Etiology: Wound Location: Left, Anterior Lower Leg Wound Open Wounding Event: Skin Tear/Laceration Status: Date Acquired: 05/02/2023 [name] ([id number]) [id number] Page 10 of 12 Date Acquired: 05/02/2023 Comorbid Glaucoma, Sleep Apnea, Hypertension, Peripheral Arterial Disease, Weeks Of Treatment: 1 History: Peripheral Venous Disease, Type II Diabetes, Gout, Osteoarthritis, Clustered Wound: No Neuropathy Photos Wound Measurements Length: (cm) 0.5 Width: (cm) 0.5 Depth: (cm) 0.1 Area: (cm) 0.196 Volume: (cm) 0.02 % Reduction in Area: 53.8% % Reduction in Volume: 52.4% Epithelialization: None Tunneling: No Undermining: No Wound Description Classification: Grade 1 Wound Margin: Distinct, outline attached Exudate Amount: Medium Exudate Type: Serous Exudate Color: amber Foul Odor  After Cleansing: No Slough/Fibrino Yes Wound Bed Granulation Amount: Small (1-33%) Exposed Structure Granulation Quality: Red Fascia Exposed: No Necrotic Amount: Large (67-100%) Fat Layer (Subcutaneous Tissue) Exposed: Yes Necrotic Quality: Adherent Slough Tendon Exposed: No Muscle Exposed: No Joint Exposed: No Bone Exposed: No Periwound Skin Texture Texture Color No Abnormalities Noted: Yes No Abnormalities Noted: No Hemosiderin Staining: Yes Moisture No Abnormalities Noted: No Temperature / Pain Dry / Scaly: Yes Temperature: No Abnormality Treatment Notes Wound #36 (Lower Leg) Wound Laterality: Left, Anterior Cleanser Soap and Water Discharge Instruction: May shower and wash wound with dial antibacterial soap and water prior to dressing change. Wound Cleanser Discharge Instruction: Cleanse the wound with wound cleanser prior to applying a clean dressing using gauze sponges, not tissue or cotton balls. Peri-Wound Care Topical Primary Dressing Maxorb Extra Ag+ Alginate Dressing, 2x2 (in/in) Discharge Instruction: Apply to wound bed as instructed Secondary Dressing Zetuvit Plus Silicone Border Dressing 3x3 (in/in) Discharge Instruction: Apply silicone border over primary dressing as directed. Secured With Compression RONEY, DURAL ([id number]) [id number] Page 11 of 12 Compression Stockings Add-Ons Electronic Signature(s) Signed: 05/09/2023 2:49:10 PM By: [name] Previous Signature: 05/09/2023 10:15:18 AM Version By: Dayton Scrape Entered By: [name] on 05/09/2023 64:40:34 -------------------------------------------------------------------------------- Wound Assessment Details Patient Name: Date of Service: [name]. 05/09/2023 10:00 A M Medical Record Number: [id number] Patient Account Number: [id number] Date of Birth/Sex: Treating RN: 14-Nov-1950 (72 y.o. M) Primary Care Ellise Kovack: [name] Other  Clinician: Referring Aisa Schoeppner: Treating Olvin Rohr/Extender: [name] in Treatment: 108 Wound Status Wound Number: 37 Primary Diabetic Wound/Ulcer of the Lower Extremity Etiology: Wound Location: Left, Medial Foot Wound Open Wounding Event: Trauma Status: Date Acquired: 04/29/2023 Comorbid Glaucoma, Sleep Apnea, Hypertension, Peripheral Arterial Disease, Weeks Of Treatment: 1 History: Peripheral Venous Disease, Type II Diabetes, Gout, Osteoarthritis, Clustered Wound: No Neuropathy Photos Wound Measurements Length: (cm) 1 Width: (cm) 1.3 Depth: (cm) 0.1 Area: (cm) 1.021 Volume: (cm) 0.102 % Reduction in Area: -225.2% % Reduction in Volume: -229% Epithelialization: None Tunneling: No Undermining: No Wound Description Classification: Grade 1 Wound Margin: Distinct, outline attached Exudate Amount: Medium Exudate Type: Serosanguineous Exudate Color: red, brown Foul Odor After Cleansing: No Slough/Fibrino Yes Wound Bed Granulation Amount: Large (67-100%) Exposed Structure Granulation Quality: Red, Pink Fascia Exposed: No Necrotic Amount: Small (1-33%) Fat Layer (Subcutaneous Tissue) Exposed: Yes Necrotic Quality: Adherent Slough Tendon Exposed: No Muscle Exposed: No Joint Exposed: No Bone Exposed: No Periwound Skin Texture Texture Color No Abnormalities Noted: No No Abnormalities NotedMYER, DAGGETT ([id number]) [id number] Page 12 of 12 Callus: Yes Temperature / Pain Temperature: No Abnormality Moisture No Abnormalities Noted: Yes Treatment Notes Wound #37 (Foot) Wound Laterality: Left, Medial Cleanser Soap and Water Discharge Instruction: May shower and wash wound with dial antibacterial soap and water prior to dressing change. Wound Cleanser Discharge Instruction: Cleanse the wound with wound cleanser prior to applying a clean dressing using gauze sponges, not tissue or cotton balls. Peri-Wound  Care Topical Primary Dressing Endoform 2x2 in Discharge Instruction: Moisten with saline Secondary Dressing Optifoam Non-Adhesive Dressing, 4x4 in Discharge Instruction: Apply over primary dressing as directed. Woven Gauze Sponges 2x2 in Discharge Instruction: Apply over primary dressing as directed. Secured With 41M Medipore H Soft Cloth Surgical T ape, 4 x 10 (in/yd) Discharge Instruction: Secure with tape as directed. Compression Wrap Compression Stockings Add-Ons Electronic Signature(s) Signed: 05/09/2023 2:49:10 PM By: [name] Previous Signature: 05/09/2023 10:15:18 AM Version By: Dayton Scrape Entered By: [name] on 05/09/2023 07:21:46 -------------------------------------------------------------------------------- Vitals Details Patient Name: Date of Service: [name]. 05/09/2023 10:00 A M Medical Record Number: [id number] Patient Account Number: [id number] Date of Birth/Sex: Treating RN: 03/14/1951 (72 y.o. M) Primary Care Nicolena Schurman: [name] Other Clinician: Referring Chyann Ambrocio: Treating Pape Parson/Extender: [name] in Treatment: 108 Vital Signs Time Taken: 10:00 Temperature (F): 98.5 Height (in): 74 Pulse (bpm): 58 Weight (lbs): 238 Respiratory Rate (breaths/min): 18 Body Mass Index (BMI): 30.6 Blood Pressure (mmHg): 148/82 Capillary Blood Glucose (mg/dl): 010 Reference Range: 80 - 120 mg / dl Electronic Signature(s) Signed: 05/09/2023 10:15:18 AM By: Dayton Scrape Entered By: Dayton Scrape on 05/09/2023 07:04:32

## 2023-05-09 NOTE — Progress Notes (Signed)
[name] ([id number]) [id number] Page 1 of 25 Visit Report for 05/09/2023 Chief Complaint Document Details Patient Name: Date of Service: [name] 05/09/2023 10:00 A M Medical Record Number: [id number] Patient Account Number: [id number] Date of Birth/Sex: Treating RN: [birth year] (72 y.o. M) Primary Care Provider: [name] Other Clinician: Referring Provider: Treating Provider/Extender: [name] in Treatment: 108 Information Obtained from: Patient Chief Complaint Left leg and foot ulcers 04/12/2021; patient is here for wounds on his left lower leg and left plantar foot over the first metatarsal head Electronic Signature(s) Signed: 05/09/2023 10:33:02 AM By: [name] MD FACS Entered By: [name] on 05/09/2023 10:33:02 -------------------------------------------------------------------------------- Debridement Details Patient Name: Date of Service: [name]. 05/09/2023 10:00 A M Medical Record Number: [id number] Patient Account Number: [id number] Date of Birth/Sex: Treating RN: [birth year] (72 y.o. [name] Primary Care Provider: [name] Other Clinician: Referring Provider: Treating Provider/Extender: [name] in Treatment: 108 Debridement Performed for Assessment: Wound #37 Left,Medial Foot Performed By: Physician [name], MD The following information was scribed by: [name] The information was scribed for: [name] Debridement Type: Debridement Severity of Tissue Pre Debridement: Fat layer exposed Level of Consciousness (Pre-procedure): Awake and Alert Pre-procedure Verification/Time Out Yes - 10:25 Taken: Start Time: 10:25 Pain Control: Lidocaine 4% T opical Solution Percent of Wound Bed Debrided: 100% T Area Debrided (cm): otal 1.02 Tissue and other material debrided: Non-Viable, Slough, Subcutaneous,  Slough Level: Skin/Subcutaneous Tissue Debridement Description: Excisional Instrument: Curette Bleeding: Minimum Hemostasis Achieved: Pressure Response to Treatment: Procedure was tolerated well Level of Consciousness (Post- Awake and Alert procedure): Post Debridement Measurements of Total Wound Length: (cm) 1 Width: (cm) 1.3 Depth: (cm) 0.1 Volume: (cm) 0.102 Character of Wound/Ulcer Post Debridement: Improved [name] ([id number]) [id number] Page 2 of 25 Severity of Tissue Post Debridement: Fat layer exposed Post Procedure Diagnosis Same as Pre-procedure Electronic Signature(s) Signed: 05/09/2023 2:49:10 PM By: [name] Signed: 05/09/2023 3:32:46 PM By: [name] MD FACS Entered By: [name] on 05/09/2023 10:26:10 -------------------------------------------------------------------------------- Debridement Details Patient Name: Date of Service: [name]. 05/09/2023 10:00 A M Medical Record Number: [id number] Patient Account Number: [id number] Date of Birth/Sex: Treating RN: [birth year] (72 y.o. [name] Primary Care Provider: [name] Other Clinician: Referring Provider: Treating Provider/Extender: [name] in Treatment: 108 Debridement Performed for Assessment: Wound #18R Left,Plantar Metatarsal head first Performed By: Physician [name], MD The following information was scribed by: [name] The information was scribed for: [name] Debridement Type: Debridement Severity of Tissue Pre Debridement: Fat layer exposed Level of Consciousness (Pre-procedure): Awake and Alert Pre-procedure Verification/Time Out Yes - 10:25 Taken: Start Time: 10:25 Pain Control: Lidocaine 4% Topical Solution Percent of Wound Bed Debrided: 100% T Area Debrided (cm): otal 2.54 Tissue and other material debrided: Non-Viable, Callus, Slough, Subcutaneous,  Slough Level: Skin/Subcutaneous Tissue Debridement Description: Excisional Instrument: Curette Bleeding: Minimum Hemostasis Achieved: Pressure Response to Treatment: Procedure was tolerated well Level of Consciousness (Post- Awake and Alert procedure): Post Debridement Measurements of Total Wound Length: (cm) 1.9 Width: (cm) 1.7 Depth: (cm) 0.1 Volume: (cm) 0.254 Character of Wound/Ulcer Post Debridement: Improved Severity of Tissue Post Debridement: Fat layer exposed Post Procedure Diagnosis Same as Pre-procedure Electronic Signature(s) Signed: 05/09/2023 2:49:10 PM By: [name] Signed: 05/09/2023 3:32:46 PM By: [name] MD FACS Entered By: [name] on 05/09/2023 10:27:18 [name] ([id number]) [id number] Page 3 of 25 -------------------------------------------------------------------------------- Debridement Details  Patient Name: Date of Service: [name] 05/09/2023 10:00 A M Medical Record Number: [id number] Patient Account Number: [id number] Date of Birth/Sex: Treating RN: [birth year] (72 y.o. [name] Primary Care Provider: [name] Other Clinician: Referring Provider: Treating Provider/Extender: [name] in Treatment: 108 Debridement Performed for Assessment: Wound #36 Left,Anterior Lower Leg Performed By: Physician [name], MD The following information was scribed by: [name] The information was scribed for: [name] Debridement Type: Debridement Severity of Tissue Pre Debridement: Fat layer exposed Level of Consciousness (Pre-procedure): Awake and Alert Pre-procedure Verification/Time Out Yes - 10:25 Taken: Start Time: 10:25 Pain Control: Lidocaine 4% T opical Solution Percent of Wound Bed Debrided: 100% T Area Debrided (cm): otal 0.2 Tissue and other material debrided: Non-Viable, Slough, Subcutaneous, Slough Level:  Skin/Subcutaneous Tissue Debridement Description: Excisional Instrument: Curette Bleeding: Minimum Hemostasis Achieved: Pressure Response to Treatment: Procedure was tolerated well Level of Consciousness (Post- Awake and Alert procedure): Post Debridement Measurements of Total Wound Length: (cm) 0.5 Width: (cm) 0.5 Depth: (cm) 0.1 Volume: (cm) 0.02 Character of Wound/Ulcer Post Debridement: Improved Severity of Tissue Post Debridement: Fat layer exposed Post Procedure Diagnosis Same as Pre-procedure Electronic Signature(s) Signed: 05/09/2023 2:49:10 PM By: [name] Signed: 05/09/2023 3:32:46 PM By: [name] MD FACS Entered By: [name] on 05/09/2023 10:27:42 -------------------------------------------------------------------------------- Debridement Details Patient Name: Date of Service: [name]. 05/09/2023 10:00 A M Medical Record Number: [id number] Patient Account Number: [id number] Date of Birth/Sex: Treating RN: 12/19/1950 (72 y.o. [name] Primary Care Provider: [name] Other Clinician: Referring Provider: Treating Provider/Extender: [name] in Treatment: 108 Debridement Performed for Assessment: Wound #22R Left,Proximal,Lateral Lower Leg Performed By: Physician [name], MD The following information was scribed by: [name] The information was scribed for: [name] Debridement Type: Debridement Level of Consciousness (Pre-procedure): Awake and Alert Pre-procedure Verification/Time Out Yes - 10:25 Taken: Start Time: 10:25 Pain Control: Lidocaine 4% Topical Solution Percent of Wound Bed Debrided: 100% T Area Debrided (cm): otal 0.61 LEEVON, MADAY ([id number]) [id number] Page 4 of 25 Tissue and other material debrided: Non-Viable, Slough, Subcutaneous, Slough Level: Skin/Subcutaneous Tissue Debridement Description:  Excisional Instrument: Curette Bleeding: Minimum Hemostasis Achieved: Pressure Response to Treatment: Procedure was tolerated well Level of Consciousness (Post- Awake and Alert procedure): Post Debridement Measurements of Total Wound Length: (cm) 1.3 Width: (cm) 0.6 Depth: (cm) 0.1 Volume: (cm) 0.061 Character of Wound/Ulcer Post Debridement: Improved Post Procedure Diagnosis Same as Pre-procedure Electronic Signature(s) Signed: 05/09/2023 2:49:10 PM By: [name] Signed: 05/09/2023 3:32:46 PM By: [name] MD FACS Entered By: [name] on 05/09/2023 10:28:09 -------------------------------------------------------------------------------- HPI Details Patient Name: Date of Service: [name]. 05/09/2023 10:00 A M Medical Record Number: [id number] Patient Account Number: [id number] Date of Birth/Sex: Treating RN: 06-Jun-1951 (72 y.o. M) Primary Care Provider: [name] Other Clinician: Referring Provider: Treating Provider/Extender: [name] in Treatment: 108 History of Present Illness HPI Description: 10/11/17; Mr. Gochnour is a 72 year old man who tells me that in 2015 he slipped down the latter traumatizing his left leg. He developed a wound in the same spot the area that we are currently looking at. He states this closed over for the most part although he always felt it was somewhat unstable. In 2016 he hit the same area with the door of his car had this reopened. He tells me that this is never really closed although sometimes an inflow it remains open on a  constant basis. He has not been using any specific dressing to this except for topical antibiotics the nature of which were not really sure. His primary doctor did send him to see Dr. Jacinto Halim of interventional cardiology. He underwent an angiogram on 08/06/17 and he underwent a PTA and directional atherectomy of the lesser distal SFA and popliteal arteries which resulted in  brisk improvement in blood flow. It was noted that he had 2 vessel runoff through the anterior tibial and peroneal. He is also been to see vascular and interventional radiologist. He was not felt to have any significant superficial venous insufficiency. Presumably is not a candidate for any ablation. It was suggested he come here for wound care. The patient is a type II diabetic on insulin. He also has a history of venous insufficiency. ABIs on the left were noncompressible in our clinic 10/21/17; patient we admitted to the clinic last week. 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 We put Iodosorb on him after an aggressive debridement and 3 layer compression. He complained of pain in his ankle and itching with is skin in fact he scratched the area on the medial calf superiorly at the rim of our wraps and he has 2 small open areas in that location today which are new. I changed his primary dressing today to silver collagen. As noted he is already had revascularization and does not have any significant superficial venous insufficiency that would be amenable to ablation 10/28/17; patient admitted to the clinic 2 weeks ago. He has a smaller Wound. Scratch injury from last week revealed. There is large wound over the tibial area. This is smaller. Granulation looks healthy. No need for debridement. 11/04/17; the wound on the left lateral calf looks better. Improved dimensions. Surface of this looks better. We've been maintaining him and Kerlix Coban wraps. He finds this much more comfortable. Silver collagen dressing 11/11/17; left lateral Wound continues to look healthy be making progress. Using a #5 curet I removed removed nonviable skin from the surface of the wound and then necrotic debris from the wound surface. Surface of the wound continues to look healthy. He also has an open area on the left great toenail bed. We've been using topical  antibiotics. 11/19/17; left anterior lateral wound continues to look healthy but it's not closed. He also had a small wound above this on the left leg Initially traumatic wounds in the setting of significant chronic venous insufficiency and stasis dermatitis 11/25/17; left anterior wounds superiorly is closed still a small wound inferiorly. 12/02/17; left anterior tibial area. Arrives today with adherent callus. Post debridement clearly not completely closed. Hydrofera Blue under 3 layer compression. 12/09/17; left anterior tibia. Circumferential eschar however the wound bed looks stable to improved. We've been using Hydrofera Blue under 3 layer compression 12/17/17; left anterior tibia. Apparently this was felt to be closed however when the wrap was taken off there is a skin tear to reopen wounds in the same area we've been using Hydrofera Blue under 3 layer compression WALDEN, CROFOOT ([id number]) [id number] Page 5 of 25 12/23/17 left anterior tibia. Not close to close this week apparently the [facility] was stuck to this again. Still circumferential eschar requiring debridement. I put a contact layer on this this time under the Hydrofera Blue 12/31/17; left anterior tibia. Wound is better slight amount of hyper-granulation. Using Hydrofera Blue over Adaptic. 01/07/18; left anterior tibia. The wound had some surface  eschar however after this was removed he has no open wound.he was already revascularized by Dr. Jacinto Halim when he came to our clinic with atherectomy of the left SFA and popliteal artery. He was also sent to interventional radiology for venous reflux studies. He was not felt to have significant reflux but certainly has chronic venous changes of his skin with hemosiderin deposition around this area. He will definitely need to lubricate his skin and wear compression stocking and I've talked to him about this. READMISSION 05/26/2018 This is a now 72 year old man we cared  for with traumatic wounds on his left anterior lower extremity. He had been previously revascularized during that admission by Dr. Jacinto Halim. Apparently in follow-up Dr. Jacinto Halim noted that he had deterioration in his arterial status. He underwent a stent placement in the distal left SFA on 04/22/2018. 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 He went back to the angiography suite on 04/30/2018 he underwent PTA and balloon angioplasty of the occluded left mid anterior tibial artery, thrombotic occlusion went from 100 to 0% which reconstitutes the posterior tibial artery. He had thrombectomy and aspiration of the peroneal artery. The stent placed in the distal SFA left SFA was still occluded. 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 He has been recuperating at [facility] in [place] after the most recent hospitalization. He is going home tomorrow. He tells me that 2 weeks ago he traumatized the tip of his left fifth toe. He came in urgently for our review of this. This was a history of before I noted that Dr. Jacinto Halim had already noted dry gangrenous changes of the left fifth toe 06/09/2018; 2-week follow-up. I did contact Dr. Jacinto Halim after his last appointment and he apparently saw 1 of Dr. Verl Dicker colleagues the next day. He does not follow-up with Dr. Jacinto Halim himself until Thursday of this week. 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 Nevertheless there is no evidence of infection no drainage and no pain. He had a new area that this week when we were signing him in today on the left anterior mid tibia area, this is in close proximity to the previous wound we have dealt with in this clinic. 06/23/2018; 2-week  follow-up. I did not receive a recent note from Dr. Jacinto Halim to review today. Our office is trying to obtain this. He is apparently not planning to do further vascular interventions and wondered about compression to try and help with the patient's chronic venous insufficiency. However we are also concerned about the arterial flow. He arrives in clinic today with a new area on the left third toe. The areas on the calf/anterior tibia are close to closing. The left fifth toe is still mummified using Betadine. -In reviewing things with the patient he has what sounds like claudication with mild to moderate amount of activity. 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 I prescribed Levaquin over the phone while we attempted to arrange a plan of care. However the patient called yesterday to report he had low-grade fever and he came in today acutely. There is been a marked deterioration in the left third toe with spreading cellulitis up into the dorsal left  foot. He was referred to the emergency room. Readmission: 06/29/2020 patient presents today for reevaluation here in our clinic he was previously treated by Dr. Leanord Hawking at the latter part of 2019 in 2 the beginning of 2020. Subsequently we have not seen him since that time in the interim he did have evaluation with vein and vascular specialist specifically Dr. Bo Mcclintock who did perform quite extensive work for a left femoral to anterior tibial artery bypass. With that being said in the interim the patient has developed significant lymphedema and has wounds that he tells me have really never healed in regard to the incision site on the left leg. He also has multiple wounds on the feet for various reasons some of which is that he tends to pick at his feet. Fortunately there is no signs of active infection systemically at this time he does have some wounds that are little bit deeper but most are fairly superficial he seems to have  good blood flow and overall everything appears to be healthy I see no bone exposed and no obvious signs of osteomyelitis. I do not know that he necessarily needs a x-ray at this point although that something we could consider depending on how things progress. The patient does have a history of lymphedema, diabetes, this is type II, chronic kidney disease stage III, hypertension, and history of peripheral vascular disease. 07/05/2020; patient admitted last week. Is a patient I remember from 2019 he had a spreading infection involving the left foot and we sent him to the hospital. 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 He subsequently had a left femoral to anterior tibial bypass by Dr.Cain vein and vascular. He also has severe lymphedema with chronic skin changes related to that on the left leg. The most problematic area that was new today was on the left medial great toe. This was apparently a small area last week there was purulent drainage which our intake nurse cultured. Also areas on the left medial foot and heel left lateral foot. He has 2 areas on the left medial calf left lateral calf in the setting of the severe lymphedema. 07/13/2020 on evaluation today patient appears to be doing better in my opinion compared to his last visit. The good news is there is no signs of active infection systemically and locally I do not see any signs of infection either. He did have an x-ray which was negative that is great news he had a culture which showed MRSA but at the same time he is been on the doxycycline which has helped. I do think we may want to extend this for 7 additional days 1/25; patient admitted to the clinic a few weeks ago. 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 We have been putting him under compression his edema control is a lot better but he is severe verricused skin on the left leg. He is really done  quite well he still has an open area on the left medial calf and the left medial first metatarsal head. We have been using silver collagen on the leg silver alginate on the foot 07/27/2020 upon evaluation today patient appears to be doing decently well in regard to his wounds. He still has a lot of dry skin on the left leg. Some of this is starting to peel back and I think he may be able to have them out by removing some that  today. Fortunately there is no signs of active infection at this time on the left leg although on the right leg he does appear to have swelling and erythema as well as some mild warmth to touch. This does have been concerned about the possibility of cellulitis although within the differential diagnosis I do think that potentially a DVT has to be at least considered. We need to rule that out before proceeding would just call in the cellulitis. Especially since he is having pain in the posterior aspect of his calf muscle. 2/8; the patient had seen sparingly. He has severe skin changes of chronic lymphedema in the left leg thickened hyperkeratotic verrucous skin. He has an open wound on the medial part of the left first met head left mid tibia. He also has a rim of nonepithelialized skin in the anterior mid tibia. He brought in the AmLactin lotion that was been prescribed although I am not sure under compression and its utility. There concern about cellulitis on the right lower leg the last time he was here. He was put on on antibiotics. His DVT rule out was negative. The right leg looks fine he is using his stocking on this area 08/10/2020 upon evaluation today patient appears to be doing well with regard to his leg currently. He has been tolerating the dressing changes without complication. Fortunately there is no signs of active infection which is great news. Overall very pleased with where things stand. 2/22; the patient still has an area on the medial part of the left first met his  head. This looks better than when I last saw this earlier this month he has a rim of epithelialization but still some surface debris. Mostly everything on the left leg is healed. There is still a vulnerable in the left mid tibia area. 08/30/2020 upon evaluation today patient appears to be doing much better in regard to his wounds on his foot. 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 Nonetheless I think we will address that today I Minna send in a prescription for him in that regard. Overall though there does not appear to be any signs of significant worsening. 09/07/2020 on evaluation today patient's wounds over his left foot appear to be doing excellent. I do not see any signs of infection there is some callus buildup this can require debridement for certain but overall I feel like he is managing quite nicely. He still using the AmLactin cream which has been beneficial for him as well. 3/22; left foot wound is closed. There is no open area here. He is using ammonium lactate lotion to the lower extremities to help exfoliate dry cracked skin. He has compression stockings from [facility] in [place]. The wound on the medial part of his left first met head is healed today. READMISSION 04/12/2021 [name] ([id number]) [id number] Page 6 of 25 Mr. Wehrly is a patient we know fairly well he had a prolonged stay in clinic in 2019 with wounds on his left lateral and left anterior lower extremity in the setting of chronic venous insufficiency. More recently he was here earlier this year with predominantly an area on his left foot first metatarsal head plantar and he says the plantar foot broke down on its not long after we discharged him but he did not come back here. The last few months areas of broken  down on his left anterior and again  the left lateral lower extremity. The leg itself is very swollen chronically enlarged a lot of hyperkeratotic dry Berry Q skin in the left lower leg. His edema extends well into the thigh. He was seen by Dr. Randie Heinz. He had ABIs on 03/02/2021 showing an ABI on the right of 1 with a TBI of 0.72 his ABI in the left at 1.09 TBI of 0.99. Monophasic and biphasic waveforms on the right. On the left monophasic waveforms were noted he went on to have an angiogram on 03/27/2021 this showed the aortic aortic and iliac segments were free of flow-limiting stenosis the left common femoral vein to evaluate the left femoral to anterior tibial artery bypass was unobstructed the bypass was patent without any areas of stenosis. We discharged the patient in bilateral juxta lite stockings but very clearly that was not sufficient to control the swelling and maintain skin integrity. He is clearly going to need compression pumps. 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 This is fortunate because he is on his feet for long periods of time. 10/27; patient comes in with our intake nurse reporting copious amount of green drainage from the left anterior mid tibia the left dorsal foot and to a lesser extent the left medial mid tibia. We left the compression wrap on all week for the amount of edema in his left leg is quite a bit better. We use silver alginate as the primary dressing 11/3; edema control is good. Left anterior lower leg left medial lower leg and the plantar first metatarsal head. The left anterior lower leg required debridement. Deep tissue culture I did of this wound showed MRSA I put him on 10 days of doxycycline which she will start today. We have him in compression wraps. He has a security card and [employer] however he is retiring on November 15. We will need to then get him into a better offloading boot for the left foot perhaps a total contact cast 11/10; edema  control is quite good. Left anterior and left medial lower leg wounds in the setting of chronic venous insufficiency and lymphedema. He also has a substantial area over the left plantar first metatarsal head. 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 He has significant hypergranulation on the left plantar foot wound. The patient is a diabetic but he does not have significant PAD 11/17; edema control is quite good. Left anterior and left medial lower leg wounds look better. The really concerning area remains the area on the left plantar first metatarsal head. He has a rim of epithelialization. 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 Starting today with a forefoot off loader but . possibly a total contact cast. 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 Arrives with a very significant odor from I think all of the wounds on the left leg and the left foot. Because of this I did not put a total contact cast on him today but will could still consider this. His wife was having cataract surgery which  is the reason he missed the appointment 12/6. I saw this man 5 days ago with a swelling below the popliteal fossa. I thought he actually might have a Baker's cyst however the DVT rule out study that we could arrange right away was negative the technician told me this was not a ruptured Baker's cyst. We attempted to get this aspirated by under ultrasound guidance in interventional radiology however all they did was an ultrasound however it shows an extensive fluid collection 62 x 8 x 9.4 in the left thigh and left calf. The patient states he thinks this started 8 days ago or so but he really is not  complaining of any pain, fever or systemic symptoms. He has not ha 12/20; after some difficulty I managed to get the patient into see Dr. Randie Heinz. Eventually he was taken into the hospital and had a drain put in the fluid collection below his left knee posteriorly extending into the posterior thigh. He still has the drain in place. Culture of this showed moderate staff aureus few Morganella and few Klebsiella he is now on doxycycline and ciprofloxacin as suggested by infectious disease he is on this for a month. The drain will remain in place until it stops draining 12/29; he comes in today with the 1 wound on his left leg and the area on the left plantar first met head significantly smaller. Both look healthy. He still has the drain in the left leg. He says he has to change this daily. Follows up with Dr. Randie Heinz on January 11. 06/29/2021; the wounds that I am following on the left leg and left first met head continued to be quite healthy. However the area where his inferior drain is in place had copious amounts of drainage which was green in color. The wound here is larger. Follows up with Dr. Pascal Lux of vein and vascular his surgeon next week as well as infectious disease. He remains on ciprofloxacin and doxycycline. He is not complaining of excessive pain in either one of the drain areas 1/12; the patient saw vascular surgery and infectious disease. Vascular surgery has left the drain in place as there was still some notable drainage still see him back in 2 weeks. Dr. Dorthula Perfect stop the doxycycline and ciprofloxacin and I do not believe he follows up with them at this point. Culture I did last week showed both doxycycline resistant MRSA and Pseudomonas not sensitive to ciprofloxacin although only in rare titers 1/19; the patient's wound on the left anterior lower leg is just about healed. We have continued healing of the area that was medially on the left leg. Left first plantar metatarsal head continues to get  smaller. The major problem here is his 2 drain sites 1 on the left upper calf and lateral thigh. There is purulent drainage still from the left lateral thigh. I gave him antibiotics last week but we still have recultured. He has the drain in the area I think this is eventually going to have to come out. I suspect there will be a connecting wound to heal here perhaps with improved VAc 1/26; the patient had his drain removed by vein and vascular on 1/25/. This was a large pocket of fluid in his left thigh that seem to tunnel into his left upper calf. 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 His mention his Penrose drain was removed today. He now has a tunneling wound on his left calf and left thigh. Both of these  probe widely towards each other although I cannot really prove that they connect. Both wounds on his lower leg anteriorly are closed and his area over the first metatarsal head on his right foot continues to improve. We are using Hydrofera Blue here. He also saw infectious disease culture of the abscess they noted was polymicrobial with MRSA, Morganella and Klebsiella he was treated with doxycycline and ciprofloxacin for 4 weeks ending on 07/03/2021. They did not recommend any further antibiotics. Notable that while he still had the Penrose drain in place last week he had purulent drainage coming out of the inferior IandD site this grew [facility], MRSA and Pseudomonas but there does not appear to be any active infection in this area today with the drain out and he is not systemically unwell 2/2; with regards to the drain sites the superior one on the thigh actually is closed down the one on the upper left lateral calf measures about 8 and half centimeters which is an improvement seems to be less prominent although still with a lot of drainage. The only remaining wound is over the first metatarsal head on the left foot and this looks to be continuing to improve with  Hydrofera Blue. 2/9; the area on his plantar left foot continues to contract. Callus around the wound edge. The drain sites specifically have not come down in depth. We put the wound VAC on Monday he changed the canister late last night our intake nurse reported a pocket of fluid perhaps caused by our compression wraps 2/16; continued improvement in left foot plantar wound. drainage site in the calf is not improved in terms of depth (wound vac) 2/23; continued improvement in the left foot wound over the first metatarsal head. With regards to the drain sites the area on his thigh laterally is healed however the open area on his calf is small in terms of circumference by still probes in by about 15 cm. Within using the wound VAC. Hydrofera Blue on his foot 08/24/2021: The left first metatarsal head wound continues to improve. The wound bed is healthy with just some surrounding callus. Unfortunately the open drain site on his calf remains open and tunnels at least 15 cm (the extent of a Q-tip). This is despite several weeks of wound VAC treatment. Based on reading back KRISTOFFER, SKOLD ([id number]) [phone].pdf Page 7 of 25 through the notes, there has been really no significant change in the depth of the wound, although the orifice is smaller and the more cranial wound on his thigh has closed. I suspect the tunnel tracks nearly all the way to this location. 08/31/2021: Continued improvement in the left first metatarsal head wound. There has been absolutely no improvement to the long tunnel from his open drain site on his calf. We have tried to get him into see vascular surgery sooner to consider the possibility of simply filleting the tract open and allowing it to heal from the bottom up, likely with a wound VAC. They have not yet scheduled a sooner appointment than his current mid April 09/14/2021: He was seen by vascular surgery and they took him to the operating room last week.  They opened a portion of the tunnel, but did not extend the entire length of the known open subcutaneous tract. I read Dr. Darcella Cheshire operative note and it is not clear from that documentation why only a portion of the tract was opened. The heaped up granulation tissue was curetted and removed from at least some portion  of the tract. They did place a wound VAC and applied an Unna boot to the leg. The ulcer on his left first metatarsal head is smaller today. The bed looks good and there is just a small amount of surrounding callus. 09/21/2021: The ulcer on his left first metatarsal head looks to be stalled. There is some callus surrounding the wound but the wound bed itself does not appear particularly dynamic. The tunnel tract on his lateral left leg seems to be roughly the same length or perhaps slightly smaller but the wound bed appears healthy with good granulation tissue. He opened up a new wound on his medial thigh and the site of a prior surgical incision. He says that he did this unconsciously in his sleep by scratching. 09/28/2021: Unfortunately, the ulcer on his left first metatarsal head has extended underneath the callus toward the dorsum of his foot. The medial thigh wounds are roughly the same. The tunnel on his lateral left leg continues to be problematic; it is longer than we are able to actually probe with a Q-tip. I am still not certain as to why Dr. Randie Heinz did not open this up entirely when he took the patient to the operating room. We will likely be back in the same situation with just a small superficial opening in a long unhealed tract, as the open portion is granulating in nicely. 10/02/2021: The patient was initially scheduled for a nurse visit, but we are also applying a total contact cast today. The plantar foot wound looks clean without significant accumulated callus. We have been applying Prisma silver collagen to the site. 10/05/2021: The patient is here for his first total contact  cast change. We have tried using gauze packing strips in the tunnel on his lateral leg wound, but this does not seem to be working any better than the white VAC foam. The foot ulcer looks about the same with minimal periwound callus. Medial thigh wound is clean with just some overlying eschar. 10/12/2021: The plantar foot wound is stable without any significant accumulation of periwound callus. The surface is viable with good granulation tissue. The medial thigh wounds are much smaller and are epithelializing. On the other hand, he had purulent drainage coming from the tunnel on his lateral leg. He does go back to see Dr. Randie Heinz next week and is planning to ask him why the wound tunnel was not completely opened at the time of his most recent operation. 10/19/2021: The plantar foot wound is markedly improved and has epithelial tissue coming through the surface. The medial thigh wounds are nearly closed with just a tiny open area. He did see Dr. Randie Heinz earlier this week and apparently they did discuss the possibility of opening the sinus tract further and enabling a wound VAC application. Apparently there are some limits as to what Dr. 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 I think if we could get the tract open to the level of the popliteal fossa, this would greatly aid in her ability to get this chart closed. That being said, however, today when I probed the tract with a Q-tip, I was not able to insert the entirety of the Q-tip as I have on previous occasions. The tunnel is shorter by about 4 cm. The surface is clean with good granulation tissue and no further episodes of purulent drainage. 10/30/2021: Last week, the patient underwent surgery and had the long tract in his leg opened. There was a rind that was  debrided, according to the operative report. His medial thigh ulcers are closed. The plantar foot wound is clean with a good surface and some built up surrounding  callus. 11/06/2021: The overall dimensions of the large wound on his lateral leg remain about the same, but there is good granulation tissue present and the tunneling is a little bit shorter. He has a new wound on his anterior tibial surface, in the same location where he had a similar lesion in the past. The plantar foot wound is clean with some buildup surrounding callus. Just toward the medial aspect of his foot, however, there is an area of darkening that once debrided, revealed another opening in the skin surface. 11/13/2021: The anterior tibial surface wound is closed. The plantar foot wound has some surrounding callus buildup. The area of darkening that I debrided last week and revealed an opening in the skin surface has closed again. The tunnel in the large wound on his lateral leg has come in by about 3 cm. There is healthy granulation tissue on the entire wound surface. 11/23/2021: The patient was out of town last week and did wet-to-dry dressings on his large wound. 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 Unfortunately, he picked open the wound on his left medial thigh. He says that it was itching and he just could not stop scratching it until it was open again. The wound on his plantar foot is smaller and has not accumulated a tremendous amount of callus. The lateral leg wound is shallower and the tunnel has also decreased in depth. There is just a little bit of slough accumulation on the surface. 11/30/2021: Another portion of his left medial thigh has been opened up. All of these wounds are fairly superficial with just a little bit of slough and eschar accumulation. The wound on his plantar foot is almost closed with just a bit of eschar and periwound callus accumulation. The lateral leg wound is nearly flush with the surrounding skin and the tunnel is markedly shallower. 12/07/2021: There is just 1 open area on his left  medial thigh. It is clean with just a little bit of perimeter eschar. The wound on his plantar foot continues to contract and just has some eschar and periwound callus accumulation. The lateral leg wound is closing at the more distal aspect and the tunnel is smaller. The surface is nearly flush with the surrounding skin and it has a good bed of granulation tissue. 12/14/2021: The thigh and foot wounds are closed. The lateral leg wound has closed over approximately half of its length. The tunnel continues to contract and the surface is now flush with the surrounding skin. The wound bed has robust granulation tissue. 12/22/2021: The thigh and foot wounds have reopened. The foot wound has a lot of callus accumulation around and over it. The thigh wound is tiny with just a little bit of slough in the wound bed. The lateral leg wound continues to contract. His vascular surgeon took the wound VAC off earlier in the week and the patient has been doing wet-to-dry dressings. There is a little slough accumulation on the surface. The tunnel is about 3 cm in depth at this point. 12/28/2021: The thigh wound is closed again. The foot wound has some callus that subsequently has peeled back exposing just a small slit of a wound. The lateral leg wound Is down to about half the size that it originally was  and the tunnel is down to about half a centimeter in depth. 01/04/2022: The thigh wound remains closed. The foot wound has heavy callus overlying the wound site. Once this was debrided, the wound was found to be closed. The lateral leg wound is smaller again this week and very superficial. No tunnel could be identified. 01/12/2022: The thigh and foot wounds both remain closed. The lateral leg wound is now nearly flush with the skin surface. There is good granulation tissue present with a light layer of slough. 01/19/2022: Due to the way his wrap was placed, the patient did not change the dressing on his thigh at all and so  the foam was saturated and his skin is macerated. There is a light layer of slough on the wound surface. The underlying granulation tissue is robust and healthy-appearing. He has heavy callus buildup at the site of his first metatarsal head wound which is still healed. 02/01/2022: He has been in silver alginate. When he removed the dressing from his thigh wound, however, some leg, superficially reopening a portion of the wound that had healed. In addition, underneath the callus at his left first metatarsal head, there appears to be a blister and the wound appears to be open again. 02/08/2022: The lateral leg wound has contracted substantially. There is eschar and a light layer of slough present. He says that it is starting to pull and is uncomfortable. On inspection, there is some puckering of the scar and the eschar is quite dry; this may account for his symptoms. On his first metatarsal head, the wound is much smaller with just some eschar on the surface. The callus has not reaccumulated. He reports that he had a blister come up on his medial thigh wound at the distal aspect. It popped and there is now an opening in his skin again. Looking back through his library of wound photos, there is what RATHA, HARTSEL ([id number]) [id number] Page 8 of 25 looks like a permanent suture just deep to this location and it may be trying to erode through. We have been using silver alginate on his wounds. 02/15/2022: The lateral leg wound is about half the size it was last week. It is clean with just a little perimeter eschar and light slough. The wound on his first metatarsal head is about the same with heavy callus overlying it. The medial thigh wound is closed again. He does have some skin changes on the top of his foot that looks potentially yeast related. 02/22/2022: The skin on the top of his foot improved with the use of a topical antifungal. The lateral leg wound continues to contract  and is again smaller this week. There is a little bit of slough and eschar on the surface. The first metatarsal head wound is a little bit smaller but has reaccumulated a thick callus over the top. He decided to try to trim his toenail and ultimately took the entire nail off of his left great toe. 03/02/2022: His lateral leg wound continues to improve, as does the wound on his left great toe. Unfortunately, it appears that somehow his foot got wet and moisture seeped in through the opening causing his skin to lift. There is a large wound now overlying his first metatarsal on both the plantar, medial, and dorsal portion of his foot. There is necrotic tissue and slough present underneath the shaggy macerated skin. 03/08/2022: The lateral leg wound is smaller again today. There is just a light layer of slough and eschar  on the surface. The great toe wound is smaller again today. The first metatarsal wound is a little bit smaller today and does not look nearly as necrotic and macerated. There is still slough and nonviable tissue present. 03/15/2022: The lateral leg wound is narrower and just has a little bit of light slough buildup. The first metatarsal wound still has a fair amount of moisture affecting the periwound skin. The great toe wound is healed. 03/22/2022: The lateral leg wound is now isolated to just at the level of his knee. There is some eschar and slough accumulation. The first metatarsal head wound has epithelialized tremendously and is about half the size that it was last week. He still has some maceration on the top of his foot and a fungal odor is present. 03/29/2022: T oday the patient's foot was macerated, suggesting that the cast got wet. The patient has also been picking at his dry skin and has enlarged the wound on his left lateral leg. In the time between having his cast removed and my evaluation, he had picked more dry skin and opened up additional wounds on his Achilles area and  dorsal foot. The plantar first metatarsal head wound, however, is smaller and clean with just macerated callus around the perimeter and light slough on the surface. The lateral leg wound measured a little bit larger but is also fairly clean with eschar and minimal slough. 04/02/2022: The patient had vascular studies done last Friday and so his cast was not applied. He is here today to have that done. 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 04/05/2022: Both wounds are smaller and quite clean. There is just a little biofilm on the lateral leg wound. 10/20; the patient has a wound on the left lateral surgical incision at the level of his lateral knee this looks clean and improved. He is using silver alginate. He also has an area on his left medial foot for which she is using Hydrofera Blue under a total contact cast both wounds are measuring smaller 04/20/2022: The plantar foot wound has contracted considerably and is very close to closing. The lateral leg wound was measured a little larger, but there was a tiny open area that was included in the measurements that was not included last week. He has some eschar around the perimeter but otherwise the wound looks clean. 04/27/2022: The lateral leg wound looks better this week. He says that midweek, he felt it was very dry and began applying hydrogel to the site. I think this was beneficial. The foot wound is nearly closed underneath a thick layer of dry skin and callus. 05/04/2022: The foot wound is healed. He has developed a new small ulcer on his anterior tibial surface about midway up his leg. It has a little slough on the surface. The lateral leg wound still is fairly dry, but clean with just a little biofilm on the surface. 05/11/2022: The wound on his foot reopened on Wednesday. A large blister formed which then broke open revealing the fat layer underneath. The ulcer on his anterior tibial surface is a little bit larger this week. The  lateral leg wound has much better moisture balance this week. Fortunately, prior to his foot wound reopening, he did get the cast made for his orthotic. 05/15/2022: Already, the left medial foot wound has improved. The tissue is less macerated and the surface is clean. The ulcer on his anterior tibial surface continues to enlarge. This seems likely secondary to accumulated moisture.  The lateral leg wound continues to have an improved moisture balance with the use of collagen. 05/25/2022: The medial foot wound continues to contract. It is now substantially smaller with just a little slough on the surface. The anterior tibial surface wound continues to enlarge further. Once again, this seems to be secondary to moisture. The lateral leg wound does not seem to be changing much in size, but the moisture balance is better. 06/01/2022: The anterior tibial wound is closed. The medial foot wound is down to just a very small, couple of millimeters, opening. The lateral leg wound has good moisture balance, but remains unchanged in size. 12/15; the patient's anterior tibial wound has reopened, however the area on his right first metatarsal head is closed. The major wound is actually on the superior part of his surgical wound in the left lateral thigh. Not a completely viable surface under illumination. This may at some point require a debridement I think he is currently using Prisma. As noted the left medial foot wound has closed 06/14/2022: The anterior tibial wound has closed. The lateral leg wound has a better surface but is basically unchanged in size. The left medial foot wound has reopened. It looks as though there was some callus accumulation and moisture got under the callus which caused the tissue to break down again. 06/21/2022: A new wound has opened up just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 is a type II diabetic on insulin. He also has a history of venous insufficiency. ABIs on the left were noncompressible in our clinic 10/21/17; patient we admitted to the clinic last week. 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 We put Iodosorb on him after an aggressive debridement and 3 layer compression. He complained of pain in his ankle and itching with is skin  in fact he scratched the area on the medial calf superiorly at the rim of our wraps and he has 2 small open areas in that location today which are new. I changed his primary dressing today to silver collagen. As noted he is already had revascularization and does not have any significant superficial venous insufficiency that would be amenable to ablation 10/28/17; patient admitted to the clinic 2 weeks ago. He has a smaller Wound. Scratch injury from last week revealed. There is large wound over the tibial area. This is smaller. Granulation looks healthy. No need for debridement. 11/04/17; the wound on  the left lateral calf looks better. Improved dimensions. Surface of this looks better. We've been maintaining him and Kerlix Coban wraps. He finds this much more comfortable. Silver collagen dressing 11/11/17; left lateral Wound continues to look healthy be making progress. Using a #5 curet I removed removed nonviable skin from the surface of the wound and then necrotic debris from the wound surface. Surface of the wound continues to look healthy. He also has an open area on the left great toenail bed. We've been using topical antibiotics. 11/19/17; left anterior lateral wound continues to look healthy but it's not closed. He also had a small wound above this on the left leg Initially traumatic wounds in the setting of significant chronic venous insufficiency and stasis dermatitis 11/25/17; left anterior wounds superiorly is closed still a small wound inferiorly. 12/02/17; left anterior tibial area. Arrives today with adherent callus. Post debridement clearly not completely closed. Hydrofera Blue under 3 layer compression. 12/09/17; left anterior tibia. Circumferential eschar however the wound bed looks stable to improved. We've been using Hydrofera Blue under 3 layer compression 12/17/17; left anterior tibia. Apparently this was felt to be closed however when the wrap was taken off there is a skin tear to reopen wounds in the same area we've been using Hydrofera Blue under 3 layer compression 12/23/17 left anterior tibia. Not close to close this week apparently the [facility] was stuck to this again. Still circumferential eschar requiring debridement. I put a contact layer on this this time under the Hydrofera Blue 12/31/17; left anterior tibia. Wound is better slight amount of hyper-granulation. Using Hydrofera Blue over Adaptic. 01/07/18; left anterior tibia. The wound had some surface eschar however after this was removed he has no open wound.he was already revascularized by Dr. Jacinto Halim when he came to  our clinic with atherectomy of the left SFA and popliteal artery. He was also sent to interventional radiology for venous reflux studies. He was not felt to have significant reflux but certainly has chronic venous changes of his skin with hemosiderin deposition around this area. He will definitely need to lubricate his skin and wear compression stocking and I've talked to him about this. READMISSION 05/26/2018 This is a now 72 year old man we cared for with traumatic wounds on his left anterior lower extremity. He had been previously revascularized during that admission by Dr. Jacinto Halim. Apparently in follow-up Dr. Jacinto Halim noted that he had deterioration in his arterial status. He underwent a stent placement in the distal left SFA on 04/22/2018. 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 He went back to the angiography suite on 04/30/2018 he underwent PTA and balloon angioplasty of the occluded left mid anterior tibial artery, thrombotic occlusion went from 100 to 0% which reconstitutes the posterior tibial artery. He had thrombectomy and aspiration of the peroneal artery. The stent placed in the distal SFA left SFA was still occluded. He was  discharged on Xarelto, it was noted on the discharge summary from this hospitalization that he had gangrene at the tip of his left fifth toe and there were expectations this would auto amputate. 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 He has been recuperating at [facility] in [facility] after the most recent hospitalization. He is going home tomorrow. He tells me that 2 weeks ago he traumatized the tip of his left fifth toe. He came in urgently for our review of this. This was a history of before I noted that Dr. Jacinto Halim had already noted dry gangrenous changes of the left fifth toe 06/09/2018; 2-week follow-up. I did contact Dr. Jacinto Halim after his last appointment and he apparently saw 1 of Dr.  Verl Dicker colleagues the next day. He does not follow-up with Dr. Jacinto Halim himself until Thursday of this week. 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 Nevertheless there is no evidence of infection no drainage and no pain. He had a new area that this week when we were signing him in today on the left anterior mid tibia area, this is in close proximity to the previous wound we have dealt with in this clinic. 06/23/2018; 2-week follow-up. I did not receive a recent note from Dr. Jacinto Halim to review today. Our office is trying to obtain this. He is apparently not planning to do further vascular interventions and wondered about compression to try and help with the patient's chronic venous insufficiency. However we are also concerned about the arterial flow. He arrives in clinic today with a new area on the left third toe. The areas on the calf/anterior tibia are close to closing. The left fifth toe is still mummified using Betadine. -In reviewing things with the patient he has what sounds like claudication with mild to moderate amount of activity. 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 I prescribed Levaquin over the phone while we attempted to arrange a plan of care. However the patient called yesterday to report he had low-grade fever and he came in today acutely. There is been a marked deterioration in the left third toe with spreading cellulitis up into the dorsal left foot. He was referred to the emergency room. Readmission: 06/29/2020 patient presents today for reevaluation here in our clinic he was previously treated by Dr. Leanord Hawking at the latter part of 2019 in 2 the beginning of 2020. Subsequently we have not seen him since that time in the interim he did have evaluation with vein and vascular specialist specifically Dr. Bo Mcclintock who did perform quite extensive work for a left femoral to anterior tibial artery bypass. With that being said in the  interim the patient has developed significant lymphedema and has wounds that he tells me have really never healed in regard to the incision site on the left leg. He also has multiple wounds on the feet for various reasons some of which is that he tends to pick at his feet. Fortunately there is no signs of active infection systemically at this time he does have some wounds that are little bit deeper but most are fairly superficial he seems to have good blood flow and overall everything appears to be healthy I see no bone exposed and no obvious signs of osteomyelitis. I do not know that he necessarily needs a x-ray at this point although that something we could consider depending on  how things progress. The patient does have a history of lymphedema, diabetes, this is type II, chronic kidney disease stage III, hypertension, and history of peripheral vascular disease. 07/05/2020; patient admitted last week. Is a patient I remember from 2019 he had a spreading infection involving the left foot and we sent him to the hospital. 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 He subsequently had a left femoral to anterior tibial bypass by Dr.Cain vein and vascular. He also has severe lymphedema with chronic skin changes related to that on the left leg. TYMIR, ONEAL ([id number]) [id number] Page 15 of 25 The most problematic area that was new today was on the left medial great toe. This was apparently a small area last week there was purulent drainage which our intake nurse cultured. Also areas on the left medial foot and heel left lateral foot. He has 2 areas on the left medial calf left lateral calf in the setting of the severe lymphedema. 07/13/2020 on evaluation today patient appears to be doing better in my opinion compared to his last visit. The good news is there is no signs of active infection systemically and locally I do not see any signs  of infection either. He did have an x-ray which was negative that is great news he had a culture which showed MRSA but at the same time he is been on the doxycycline which has helped. I do think we may want to extend this for 7 additional days 1/25; patient admitted to the clinic a few weeks ago. 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 We have been putting him under compression his edema control is a lot better but he is severe verricused skin on the left leg. He is really done quite well he still has an open area on the left medial calf and the left medial first metatarsal head. We have been using silver collagen on the leg silver alginate on the foot 07/27/2020 upon evaluation today patient appears to be doing decently well in regard to his wounds. He still has a lot of dry skin on the left leg. Some of this is starting to peel back and I think he may be able to have them out by removing some that today. Fortunately there is no signs of active infection at this time on the left leg although on the right leg he does appear to have swelling and erythema as well as some mild warmth to touch. This does have been concerned about the possibility of cellulitis although within the differential diagnosis I do think that potentially a DVT has to be at least considered. We need to rule that out before proceeding would just call in the cellulitis. Especially since he is having pain in the posterior aspect of his calf muscle. 2/8; the patient had seen sparingly. He has severe skin changes of chronic lymphedema in the left leg thickened hyperkeratotic verrucous skin. He has an open wound on the medial part of the left first met head left mid tibia. He also has a rim of nonepithelialized skin in the anterior mid tibia. He brought in the AmLactin lotion that was been prescribed although I am not sure under compression and its utility. There concern about cellulitis on the  right lower leg the last time he was here. He was put on on antibiotics. His DVT rule out was negative. The right leg looks fine he  is using his stocking on this area 08/10/2020 upon evaluation today patient appears to be doing well with regard to his leg currently. He has been tolerating the dressing changes without complication. Fortunately there is no signs of active infection which is great news. Overall very pleased with where things stand. 2/22; the patient still has an area on the medial part of the left first met his head. This looks better than when I last saw this earlier this month he has a rim of epithelialization but still some surface debris. Mostly everything on the left leg is healed. There is still a vulnerable in the left mid tibia area. 08/30/2020 upon evaluation today patient appears to be doing much better in regard to his wounds on his foot. 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 Nonetheless I think we will address that today I Minna send in a prescription for him in that regard. Overall though there does not appear to be any signs of significant worsening. 09/07/2020 on evaluation today patient's wounds over his left foot appear to be doing excellent. I do not see any signs of infection there is some callus buildup this can require debridement for certain but overall I feel like he is managing quite nicely. He still using the AmLactin cream which has been beneficial for him as well. 3/22; left foot wound is closed. There is no open area here. He is using ammonium lactate lotion to the lower extremities to help exfoliate dry cracked skin. He has compression stockings from elastic therapy in [place]. The wound on the medial part of his left first met head is healed today. READMISSION 04/12/2021 Mr. Briceno is a patient we know fairly well he had  a prolonged stay in clinic in 2019 with wounds on his left lateral and left anterior lower extremity in the setting of chronic venous insufficiency. More recently he was here earlier this year with predominantly an area on his left foot first metatarsal head plantar and he says the plantar foot broke down on its not long after we discharged him but he did not come back here. The last few months areas of broken down on his left anterior and again the left lateral lower extremity. The leg itself is very swollen chronically enlarged a lot of hyperkeratotic dry Berry Q skin in the left lower leg. His edema extends well into the thigh. He was seen by Dr. Randie Heinz. He had ABIs on 03/02/2021 showing an ABI on the right of 1 with a TBI of 0.72 his ABI in the left at 1.09 TBI of 0.99. Monophasic and biphasic waveforms on the right. On the left monophasic waveforms were noted he went on to have an angiogram on 03/27/2021 this showed the aortic aortic and iliac segments were free of flow-limiting stenosis the left common femoral vein to evaluate the left femoral to anterior tibial artery bypass was unobstructed the bypass was patent without any areas of stenosis. We discharged the patient in bilateral juxta lite stockings but very clearly that was not sufficient to control the swelling and maintain skin integrity. He is clearly going to need compression pumps. 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 This is fortunate because he is on his feet for long periods of time. 10/27; patient comes in  with our intake nurse reporting copious amount of green drainage from the left anterior mid tibia the left dorsal foot and to a lesser extent the left medial mid tibia. We left the compression wrap on all week for the amount of edema in his left leg is quite a bit better. We use silver alginate as the primary dressing 11/3; edema control is good. Left anterior lower leg left  medial lower leg and the plantar first metatarsal head. The left anterior lower leg required debridement. Deep tissue culture I did of this wound showed MRSA I put him on 10 days of doxycycline which she will start today. We have him in compression wraps. He has a security card and [employer] however he is retiring on November 15. We will need to then get him into a better offloading boot for the left foot perhaps a total contact cast 11/10; edema control is quite good. Left anterior and left medial lower leg wounds in the setting of chronic venous insufficiency and lymphedema. He also has a substantial area over the left plantar first metatarsal head. 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 He has significant hypergranulation on the left plantar foot wound. The patient is a diabetic but he does not have significant PAD 11/17; edema control is quite good. Left anterior and left medial lower leg wounds look better. The really concerning area remains the area on the left plantar first metatarsal head. He has a rim of epithelialization. 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 Starting today with a forefoot off loader but . possibly a total contact cast. 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 Arrives with a very significant odor from I think all of the wounds on the left leg and the left foot. Because of this I did not put a total contact cast on him today but will could still consider this. His wife was having cataract surgery which is the reason he missed the appointment 12/6. I saw this man 5 days ago with a swelling below the popliteal fossa. I thought he actually might have a Baker's  cyst however the DVT rule out study that we could arrange right away was negative the technician told me this was not a ruptured Baker's cyst. We attempted to get this aspirated by under ultrasound guidance in interventional radiology however all they did was an ultrasound however it shows an extensive fluid collection 62 x 8 x 9.4 in the left thigh and left calf. The patient states he thinks this started 8 days ago or so but he really is not complaining of any pain, fever or systemic symptoms. He has not ha 12/20; after some difficulty I managed to get the patient into see Dr. Randie Heinz. Eventually he was taken into the hospital and had a drain put in the fluid collection below his left knee posteriorly extending into the posterior thigh. He still has the drain in place. Culture of this showed moderate staff aureus few Morganella and few Klebsiella he is now on doxycycline and ciprofloxacin as suggested by infectious disease he is on this for a month. The drain will remain in place  until it stops draining 12/29; he comes in today with the 1 wound on his left leg and the area on the left plantar first met head significantly smaller. Both look healthy. He still has the drain in the left leg. He says he has to change this daily. Follows up with Dr. Randie Heinz on January 11. KELLIN, TRIMBACH ([id number]) [id number] Page 16 of 25 06/29/2021; the wounds that I am following on the left leg and left first met head continued to be quite healthy. However the area where his inferior drain is in place had copious amounts of drainage which was green in color. The wound here is larger. Follows up with Dr. Pascal Lux of vein and vascular his surgeon next week as well as infectious disease. He remains on ciprofloxacin and doxycycline. He is not complaining of excessive pain in either one of the drain areas 1/12; the patient saw vascular surgery and infectious disease. Vascular surgery has left the drain in  place as there was still some notable drainage still see him back in 2 weeks. Dr. Dorthula Perfect stop the doxycycline and ciprofloxacin and I do not believe he follows up with them at this point. Culture I did last week showed both doxycycline resistant MRSA and Pseudomonas not sensitive to ciprofloxacin although only in rare titers 1/19; the patient's wound on the left anterior lower leg is just about healed. We have continued healing of the area that was medially on the left leg. Left first plantar metatarsal head continues to get smaller. The major problem here is his 2 drain sites 1 on the left upper calf and lateral thigh. There is purulent drainage still from the left lateral thigh. I gave him antibiotics last week but we still have recultured. He has the drain in the area I think this is eventually going to have to come out. I suspect there will be a connecting wound to heal here perhaps with improved VAc 1/26; the patient had his drain removed by vein and vascular on 1/25/. This was a large pocket of fluid in his left thigh that seem to tunnel into his left upper calf. 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 His mention his Penrose drain was removed today. He now has a tunneling wound on his left calf and left thigh. Both of these probe widely towards each other although I cannot really prove that they connect. Both wounds on his lower leg anteriorly are closed and his area over the first metatarsal head on his right foot continues to improve. We are using Hydrofera Blue here. He also saw infectious disease culture of the abscess they noted was polymicrobial with MRSA, Morganella and Klebsiella he was treated with doxycycline and ciprofloxacin for 4 weeks ending on 07/03/2021. They did not recommend any further antibiotics. Notable that while he still had the Penrose drain in place last week he had purulent drainage coming out of the inferior IandD site this grew [facility], MRSA  and Pseudomonas but there does not appear to be any active infection in this area today with the drain out and he is not systemically unwell 2/2; with regards to the drain sites the superior one on the thigh actually is closed down the one on the upper left lateral calf measures about 8 and half centimeters which is an improvement seems to be less prominent although still with a lot of drainage. The only remaining wound is over the first metatarsal head on the left foot and this  looks to be continuing to improve with Hydrofera Blue. 2/9; the area on his plantar left foot continues to contract. Callus around the wound edge. The drain sites specifically have not come down in depth. We put the wound VAC on Monday he changed the canister late last night our intake nurse reported a pocket of fluid perhaps caused by our compression wraps 2/16; continued improvement in left foot plantar wound. drainage site in the calf is not improved in terms of depth (wound vac) 2/23; continued improvement in the left foot wound over the first metatarsal head. With regards to the drain sites the area on his thigh laterally is healed however the open area on his calf is small in terms of circumference by still probes in by about 15 cm. Within using the wound VAC. Hydrofera Blue on his foot 08/24/2021: The left first metatarsal head wound continues to improve. The wound bed is healthy with just some surrounding callus. Unfortunately the open drain site on his calf remains open and tunnels at least 15 cm (the extent of a Q-tip). This is despite several weeks of wound VAC treatment. Based on reading back through the notes, there has been really no significant change in the depth of the wound, although the orifice is smaller and the more cranial wound on his thigh has closed. I suspect the tunnel tracks nearly all the way to this location. 08/31/2021: Continued improvement in the left first metatarsal head wound. There has been  absolutely no improvement to the long tunnel from his open drain site on his calf. We have tried to get him into see vascular surgery sooner to consider the possibility of simply filleting the tract open and allowing it to heal from the bottom up, likely with a wound VAC. They have not yet scheduled a sooner appointment than his current mid April 09/14/2021: He was seen by vascular surgery and they took him to the operating room last week. They opened a portion of the tunnel, but did not extend the entire length of the known open subcutaneous tract. I read Dr. Darcella Cheshire operative note and it is not clear from that documentation why only a portion of the tract was opened. The heaped up granulation tissue was curetted and removed from at least some portion of the tract. They did place a wound VAC and applied an Unna boot to the leg. The ulcer on his left first metatarsal head is smaller today. The bed looks good and there is just a small amount of surrounding callus. 09/21/2021: The ulcer on his left first metatarsal head looks to be stalled. There is some callus surrounding the wound but the wound bed itself does not appear particularly dynamic. The tunnel tract on his lateral left leg seems to be roughly the same length or perhaps slightly smaller but the wound bed appears healthy with good granulation tissue. He opened up a new wound on his medial thigh and the site of a prior surgical incision. He says that he did this unconsciously in his sleep by scratching. 09/28/2021: Unfortunately, the ulcer on his left first metatarsal head has extended underneath the callus toward the dorsum of his foot. The medial thigh wounds are roughly the same. The tunnel on his lateral left leg continues to be problematic; it is longer than we are able to actually probe with a Q-tip. I am still not certain as to why Dr. Randie Heinz did not open this up entirely when he took the patient to the operating room.  We will likely be back in  the same situation with just a small superficial opening in a long unhealed tract, as the open portion is granulating in nicely. 10/02/2021: The patient was initially scheduled for a nurse visit, but we are also applying a total contact cast today. The plantar foot wound looks clean without significant accumulated callus. We have been applying Prisma silver collagen to the site. 10/05/2021: The patient is here for his first total contact cast change. We have tried using gauze packing strips in the tunnel on his lateral leg wound, but this does not seem to be working any better than the white VAC foam. The foot ulcer looks about the same with minimal periwound callus. Medial thigh wound is clean with just some overlying eschar. 10/12/2021: The plantar foot wound is stable without any significant accumulation of periwound callus. The surface is viable with good granulation tissue. The medial thigh wounds are much smaller and are epithelializing. On the other hand, he had purulent drainage coming from the tunnel on his lateral leg. He does go back to see Dr. Randie Heinz next week and is planning to ask him why the wound tunnel was not completely opened at the time of his most recent operation. 10/19/2021: The plantar foot wound is markedly improved and has epithelial tissue coming through the surface. The medial thigh wounds are nearly closed with just a tiny open area. He did see Dr. Randie Heinz earlier this week and apparently they did discuss the possibility of opening the sinus tract further and enabling a wound VAC application. Apparently there are some limits as to what Dr. 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 I think if we could get the tract open to the level of the popliteal fossa, this would greatly aid in her ability to get this chart closed. That being said, however, today when I probed the tract with a Q-tip, I was not able to insert the entirety of the Q-tip as I  have on previous occasions. The tunnel is shorter by about 4 cm. The surface is clean with good granulation tissue and no further episodes of purulent drainage. 10/30/2021: Last week, the patient underwent surgery and had the long tract in his leg opened. There was a rind that was debrided, according to the operative report. His medial thigh ulcers are closed. The plantar foot wound is clean with a good surface and some built up surrounding callus. 11/06/2021: The overall dimensions of the large wound on his lateral leg remain about the same, but there is good granulation tissue present and the tunneling is a little bit shorter. He has a new wound on his anterior tibial surface, in the same location where he had a similar lesion in the past. The plantar foot wound is clean with some buildup surrounding callus. Just toward the medial aspect of his foot, however, there is an area of darkening that once debrided, revealed another opening in the skin surface. 11/13/2021: The anterior tibial surface wound is closed. The plantar foot wound has some surrounding callus buildup. The area of darkening that I debrided last week and revealed an opening in the skin surface has closed again. The tunnel in the large wound on his lateral leg has come in by about 3 cm. There is healthy granulation tissue on the entire wound surface. IZRAEL, DRAGOS ([id number]) [id number] Page 17 of 25 11/23/2021: The patient was out of town last week and did wet-to-dry dressings on his large wound.  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 Unfortunately, he picked open the wound on his left medial thigh. He says that it was itching and he just could not stop scratching it until it was open again. The wound on his plantar foot is smaller and has not accumulated a tremendous amount of callus. The lateral leg wound is shallower and the tunnel has  also decreased in depth. There is just a little bit of slough accumulation on the surface. 11/30/2021: Another portion of his left medial thigh has been opened up. All of these wounds are fairly superficial with just a little bit of slough and eschar accumulation. The wound on his plantar foot is almost closed with just a bit of eschar and periwound callus accumulation. The lateral leg wound is nearly flush with the surrounding skin and the tunnel is markedly shallower. 12/07/2021: There is just 1 open area on his left medial thigh. It is clean with just a little bit of perimeter eschar. The wound on his plantar foot continues to contract and just has some eschar and periwound callus accumulation. The lateral leg wound is closing at the more distal aspect and the tunnel is smaller. The surface is nearly flush with the surrounding skin and it has a good bed of granulation tissue. 12/14/2021: The thigh and foot wounds are closed. The lateral leg wound has closed over approximately half of its length. The tunnel continues to contract and the surface is now flush with the surrounding skin. The wound bed has robust granulation tissue. 12/22/2021: The thigh and foot wounds have reopened. The foot wound has a lot of callus accumulation around and over it. The thigh wound is tiny with just a little bit of slough in the wound bed. The lateral leg wound continues to contract. His vascular surgeon took the wound VAC off earlier in the week and the patient has been doing wet-to-dry dressings. There is a little slough accumulation on the surface. The tunnel is about 3 cm in depth at this point. 12/28/2021: The thigh wound is closed again. The foot wound has some callus that subsequently has peeled back exposing just a small slit of a wound. The lateral leg wound Is down to about half the size that it originally was and the tunnel is down to about half a centimeter in depth. 01/04/2022: The thigh wound remains closed. The  foot wound has heavy callus overlying the wound site. Once this was debrided, the wound was found to be closed. The lateral leg wound is smaller again this week and very superficial. No tunnel could be identified. 01/12/2022: The thigh and foot wounds both remain closed. The lateral leg wound is now nearly flush with the skin surface. There is good granulation tissue present with a light layer of slough. 01/19/2022: Due to the way his wrap was placed, the patient did not change the dressing on his thigh at all and so the foam was saturated and his skin is macerated. There is a light layer of slough on the wound surface. The underlying granulation tissue is robust and healthy-appearing. He has heavy callus buildup at the site of his first metatarsal head wound which is still healed. 02/01/2022: He has been in silver alginate. When he removed the dressing from his thigh wound, however, some leg, superficially reopening a portion of the wound that had healed. In addition, underneath the callus at his left first metatarsal head, there  appears to be a blister and the wound appears to be open again. 02/08/2022: The lateral leg wound has contracted substantially. There is eschar and a light layer of slough present. He says that it is starting to pull and is uncomfortable. On inspection, there is some puckering of the scar and the eschar is quite dry; this may account for his symptoms. On his first metatarsal head, the wound is much smaller with just some eschar on the surface. The callus has not reaccumulated. He reports that he had a blister come up on his medial thigh wound at the distal aspect. It popped and there is now an opening in his skin again. Looking back through his library of wound photos, there is what looks like a permanent suture just deep to this location and it may be trying to erode through. We have been using silver alginate on his wounds. 02/15/2022: The lateral leg wound is about half the  size it was last week. It is clean with just a little perimeter eschar and light slough. The wound on his first metatarsal head is about the same with heavy callus overlying it. The medial thigh wound is closed again. He does have some skin changes on the top of his foot that looks potentially yeast related. 02/22/2022: The skin on the top of his foot improved with the use of a topical antifungal. The lateral leg wound continues to contract and is again smaller this week. There is a little bit of slough and eschar on the surface. The first metatarsal head wound is a little bit smaller but has reaccumulated a thick callus over the top. He decided to try to trim his toenail and ultimately took the entire nail off of his left great toe. 03/02/2022: His lateral leg wound continues to improve, as does the wound on his left great toe. Unfortunately, it appears that somehow his foot got wet and moisture seeped in through the opening causing his skin to lift. There is a large wound now overlying his first metatarsal on both the plantar, medial, and dorsal portion of his foot. There is necrotic tissue and slough present underneath the shaggy macerated skin. 03/08/2022: The lateral leg wound is smaller again today. There is just a light layer of slough and eschar on the surface. The great toe wound is smaller again today. The first metatarsal wound is a little bit smaller today and does not look nearly as necrotic and macerated. There is still slough and nonviable tissue present. 03/15/2022: The lateral leg wound is narrower and just has a little bit of light slough buildup. The first metatarsal wound still has a fair amount of moisture affecting the periwound skin. The great toe wound is healed. 03/22/2022: The lateral leg wound is now isolated to just at the level of his knee. There is some eschar and slough accumulation. The first metatarsal head wound has epithelialized tremendously and is about half the size  that it was last week. He still has some maceration on the top of his foot and a fungal odor is present. 03/29/2022: T oday the patient's foot was macerated, suggesting that the cast got wet. The patient has also been picking at his dry skin and has enlarged the wound on his left lateral leg. In the time between having his cast removed and my evaluation, he had picked more dry skin and opened up additional wounds on his Achilles area and dorsal foot. The plantar first metatarsal head wound, however, is smaller and  clean with just macerated callus around the perimeter and light slough on the surface. The lateral leg wound measured a little bit larger but is also fairly clean with eschar and minimal slough. 04/02/2022: The patient had vascular studies done last Friday and so his cast was not applied. He is here today to have that done. 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 04/05/2022: Both wounds are smaller and quite clean. There is just a little biofilm on the lateral leg wound. 10/20; the patient has a wound on the left lateral surgical incision at the level of his lateral knee this looks clean and improved. He is using silver alginate. He also has an area on his left medial foot for which she is using Hydrofera Blue under a total contact cast both wounds are measuring smaller 04/20/2022: The plantar foot wound has contracted considerably and is very close to closing. The lateral leg wound was measured a little larger, but there was a tiny open area that was included in the measurements that was not included last week. He has some eschar around the perimeter but otherwise the wound looks clean. 04/27/2022: The lateral leg wound looks better this week. He says that midweek, he felt it was very dry and began applying hydrogel to the site. I think this was beneficial. The foot wound is nearly closed underneath a thick layer of dry skin and callus. 05/04/2022: The foot wound is healed. He  has developed a new small ulcer on his anterior tibial surface about midway up his leg. It has a little slough on the surface. The lateral leg wound still is fairly dry, but clean with just a little biofilm on the surface. 05/11/2022: The wound on his foot reopened on Wednesday. A large blister formed which then broke open revealing the fat layer underneath. The ulcer on his anterior tibial surface is a little bit larger this week. The lateral leg wound has much better moisture balance this week. Fortunately, prior to his foot wound reopening, he did get the cast made for his orthotic. HAYTHAM, VAIRO ([id number]) [id number] Page 18 of 25 05/15/2022: Already, the left medial foot wound has improved. The tissue is less macerated and the surface is clean. The ulcer on his anterior tibial surface continues to enlarge. This seems likely secondary to accumulated moisture. The lateral leg wound continues to have an improved moisture balance with the use of collagen. 05/25/2022: The medial foot wound continues to contract. It is now substantially smaller with just a little slough on the surface. The anterior tibial surface wound continues to enlarge further. Once again, this seems to be secondary to moisture. The lateral leg wound does not seem to be changing much in size, but the moisture balance is better. 06/01/2022: The anterior tibial wound is closed. The medial foot wound is down to just a very small, couple of millimeters, opening. The lateral leg wound has good moisture balance, but remains unchanged in size. 12/15; the patient's anterior tibial wound has reopened, however the area on his right first metatarsal head is closed. The major wound is actually on the superior part of his surgical wound in the left lateral thigh. Not a completely viable surface under illumination. This may at some point require a debridement I think he is currently using Prisma. As noted the left  medial foot wound has closed 06/14/2022: The anterior tibial wound has closed. The lateral leg wound has a better surface but is basically unchanged in size. The  left medial foot wound has reopened. It looks as though there was some callus accumulation and moisture got under the callus which caused the tissue to break down again. 06/21/2022: A new wound has opened up just distal to the previous anterior tibial wound. It is small but has hypertrophic granulation tissue present. The lateral leg wound is a little bit narrower and has a layer of slough on the surface. The left medial foot wound is down to just a pinhole. His custom orthotics should be available next week. 06/28/2022: The wound on his first metatarsal head has healed. He has developed a new small wound on his medial lower leg, in an old scar site. The lateral leg wound continues to contract but continues to accumulate slough, as well. 07/03/2022: Despite wearing his custom orthopedic shoes, he managed to reopen the wound on his first metatarsal head. He says he thinks his foot got wet and then some skin lifted up and he peeled this away. Both of the lower leg wounds are smaller and have some dry eschar on the surface. The lateral leg wound is quite a bit narrower today. 07/12/2022: The medial lower leg wound is closed. The anterior lower leg wound has contracted considerably. The lateral upper leg wound is narrower with a layer of slough on the surface. The first metatarsal head wound is also smaller, but had copious drainage which saturated the foam border dressing and resulted in some periwound tissue maceration. Fortunately there was no breakdown at this site. 07/19/2022: The lower leg shows signs of significant maceration; I think he must be sweating excessively inside his cast. There are several areas of skin breakdown present. The wound on his foot is smaller and that on his lateral leg is narrower and is shorter by about a  centimeter. 07/26/2022: Last week we used a zinc Coflex wrap prior to applying his total contact cast and this has had the effect of keeping his skin from getting macerated this week. The anterior leg wound has epithelialized substantially. The lateral leg wound is significantly smaller with just a bit of slough on the surface. The first metatarsal head wound is also smaller this week. 08/02/2022: The anterior leg wound was closed on arrival, but while he was sitting in the room, he picked it open again. The lateral leg wound is smaller with just a little slough on the surface and the first metatarsal head wound has contracted further, as well. 08/09/2022: The first metatarsal head wound is covered with callus. Underneath the callus, it is nearly completely closed. The lateral leg wound is smaller again this week. The anterior leg wound looks better, but he has such heavy buildup of old skin, that moisture is getting underneath this, becoming trapped, and causing the underlying skin to get macerated and open up. 08/16/2022: The first metatarsal head wound is closed. The lateral leg wound continues to contract and is quite a bit smaller again this week. There is just a small, superficial opening remaining on his anterior tibial surface. 08/23/2022: The first metatarsal head wound has, by some miracle, remained closed. The lateral leg wound is substantially smaller with multiple areas of epithelialization. The anterior tibial surface wound is also quite a bit smaller and very clean. 08/30/2022: Unfortunately, his first metatarsal head wound opened up again. It happened in the same fashion as it has on prior occasions. Moisture got under dried skin/callus and created a wound when he removed his sock, taking the skin with it. The anterior tibial surface  has a thick shell of hyperkeratotic skin. This has been contributing to ongoing repeat wounding events as moisture gets underneath this and causes tissue  breakdown. 3/15; patient presents for follow-up. His anterior left leg wound has healed. He still has the wound to the left lateral aspect and left first met head. We have been using silver alginate and endoform to these areas under [employer]. He has no issues or complaints today. He has been taking Augmentin and reports improvement to his symptoms to the left first met head. 09/13/2022: He has accumulated more thick dry skin in sheets on his lower leg. The lateral leg wound is about the same size and the left first metatarsal head wound is a little bit smaller. There is slough on both surfaces. There is callus buildup around the foot wound. 09/20/2022: The lateral leg wound is a little bit narrower and the left first metatarsal head wound also seems to have contracted slightly. There is slough on both surfaces. He has a little skin breakdown on his anterior tibial surface. 09/27/2022: The lateral leg wound continues to contract and is quite clean. The first metatarsal head wound is also smaller. There is some perimeter callus and slough accumulation on the foot. The anterior tibial surface is closed. 10/04/2022: Both of his wounds are smaller today, particularly the first metatarsal head wound. 10/18/2022: He missed his appointment last week and ended up cutting off his wrap on Saturday. The anterior tibial wound reopened. It is fairly superficial with a little bit of slough on the surface. His lateral leg wound is smaller with some slough and eschar buildup. The first metatarsal head wound is also smaller with some callus and slough accumulation. 10/25/2022: All wounds are smaller. There is slough and eschar on the lateral leg and slough and callus on the plantar foot wound. The anterior tibial wound is clean and flush with the surrounding skin. No debris accumulation here. 11/01/2022: The wounds are all smaller again this week. There is slough on the lateral leg and some minimal slough and eschar on the  anterior tibial wound. There is callus accumulation on the first metatarsal head site, along with slough. There is also a yeasty odor coming from the foot. 11/08/2022: The lateral leg wound is smaller except where he picked some skin while waiting to be seen. There is a little bit of slough on the surface. The anterior tibial wound is closed. The first metatarsal head site has gotten macerated once again and has a lot of spongy wet tissue and callus around it. No yeast odor today. 11/15/2022: The lateral leg wound continues to contract. There is now a band of epithelium dividing it into 2 areas. Minimal slough accumulation. He managed to pick open a new wound on his medial lower leg. The first metatarsal head site is smaller with some callus accumulation but no tissue maceration. 11/22/2022: The lateral leg wound is smaller again by about a third today. There is a little bit of slough on the surface with some periwound eschar. The new wound that he picked open last week has healed but he picked open 3 new small wounds on his anterior tibial surface, once again due to his picking at his skin. ZAY, BONI ([id number]) [id number] Page 19 of 25 The first metatarsal head wound looks about the same. There has been some callus accumulation and there is more edema present, as he was not put in a compression wrap last week. 11/29/2022: The lateral leg wound is smaller  again today. There is a little periwound eschar and some slough present. The wounds on his anterior tibial surface are all closed except for 1 that has a little bit of slough on the open portion with eschar covering it. The first metatarsal head wound measured a little bit smaller today, but mostly looks about the same. Edema control is better. 12/06/2022: The anterior tibial wound is closed. The lateral leg wound is smaller with some slough and eschar accumulation. The plantar foot ulcer is about the same size, but the  tissue does show some evidence of the fact that he was on his feet quite a bit more this past week; there was a death in his family. There has been more callus accumulation and the tissues are little bit more purpleish. 12/13/2022: He picked the skin on his anterior tibia and reopened a wound there while he was waiting to be seen in clinic. The lateral leg wound is much smaller with a little bit of slough and eschar. The plantar foot ulcer is smaller today with callus and slough buildup, but without the pressure induced tissue injury that was seen last week. 12/20/2022: The anterior tibial wound has healed. The lateral leg wound is smaller again this week. There is a little bit of eschar buildup on the surface. The foot had a fairly strong odor coming from it that persisted even after washing. The wound itself looks like its gotten a little smaller but he has built up thick callus, once again. 12/26/2022: The lateral leg wound is down to just a narrow superficial slit. There is slough and a little bit of dry skin present. The foot is in much better shape today. There is less callus accumulation and no odor. The skin edges are starting to roll inward, however. 01/03/2023: The lateral leg wound is healed. The skin is quite dry but the wound has closed. The foot continues to contract. He has a fair amount of periwound callus accumulation and the surface of the is a little drier than ideal. 01/10/2023: The large lateral leg wound remains closed. He managed to pick off some of his dry skin and has multiple small open superficial wounds on his lower leg. The foot wound measures smaller, but he has a blister immediately adjacent to it. 01/17/2023: The large lateral leg wound remains closed. The small superficial wounds on his lower leg have also closed. The foot wound is about the same and the blister area immediately adjacent to it has dark eschar on the surface. The foot wound looks a bit dry. 01/24/2023: He  picked at some dry skin on his lateral leg wound and reopened. The surface is dry and fibrotic. The foot wound is slightly smaller but has periwound callus and the edges are rolling inward again. 01/31/2023: His lateral leg wound is larger again today. It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 L85.9 Epidermal thickening, unspecified 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 Facility Procedures : CPT4 Code: [id number] Description: 11042 - DEB SUBQ TISSUE 20 SQ CM/< ICD-10 Diagnosis Description 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 L97.828  Non-pressure chronic ulcer of other part of left lower leg with other specified Modifier: ity severity Quantity: 1 Physician Procedures CLOYDE, SHANKLIN ([id number]): CPT4 Code Description [id number] [phone] -  WC PHYS LEVEL 3 - EST PT ICD-10 Diagnosis Description L97.528 Non-pressure chronic ulcer of other part of left foot with other 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 with other  specified sev L97.828 Non-pressure chronic ulcer of other part of left lower leg with o I87.322 Chronic venous hypertension (idiopathic) with inflammation of lef [id number].pdf Page 25 of 25: Quantity Modifier 1 specified severity erity ther specified severity t lower extremity ZAIDYN, DUPES ([id number]): [id number] 11042 - WC PHYS SUBQ TISS 20 SQ CM ICD-10 Diagnosis Description L97.528 Non-pressure chronic ulcer of other part of left foot with other 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

## 2023-05-16 ENCOUNTER — Other Ambulatory Visit: Payer: Self-pay

## 2023-05-16 ENCOUNTER — Encounter (HOSPITAL_BASED_OUTPATIENT_CLINIC_OR_DEPARTMENT_OTHER): Payer: Medicare Other | Admitting: General Surgery

## 2023-05-16 DIAGNOSIS — I87332 Chronic venous hypertension (idiopathic) with ulcer and inflammation of left lower extremity: Secondary | ICD-10-CM | POA: Diagnosis not present

## 2023-05-16 DIAGNOSIS — I739 Peripheral vascular disease, unspecified: Secondary | ICD-10-CM

## 2023-05-16 NOTE — Progress Notes (Signed)
NEELESH, NISHIKAWA ([id number]) [id number] Page 1 of 12 Visit Report for 05/16/2023 Arrival Information Details Patient Name: Date of Service: [name], [name] 05/16/2023 10:00 A M Medical Record Number: [id number] Patient Account Number: [id number] Date of Birth/Sex: Treating RN: Mar 15, 1951 (72 y.o. [name] Primary Care [name]: [name] Other Clinician: Referring Yakub Lodes: Treating [name]/Extender: [name] in Treatment: 109 Visit Information History Since Last Visit Added or deleted any medications: No Patient Arrived: Ambulatory Any new allergies or adverse reactions: No Arrival Time: 10:02 Had a fall or experienced change in No Accompanied By: self activities of daily living that may affect Transfer Assistance: None risk of falls: Patient Identification Verified: Yes Signs or symptoms of abuse/neglect since last visito No Secondary Verification Process Completed: Yes Hospitalized since last visit: No Patient Requires Transmission-Based Precautions: No Implantable device outside of the clinic excluding No Patient Has Alerts: Yes cellular tissue based products placed in the center since last visit: Has Dressing in Place as Prescribed: Yes Has Compression in Place as Prescribed: Yes Pain Present Now: No Electronic Signature(s) Signed: 05/16/2023 4:22:05 PM By: [name] Entered By: [name] on 05/16/2023 10:02:34 -------------------------------------------------------------------------------- Encounter Discharge Information Details Patient Name: Date of Service: [name] Grapes. 05/16/2023 10:00 A M Medical Record Number: [id number] Patient Account Number: [id number] Date of Birth/Sex: Treating RN: [birth year] (72 y.o. [name] Primary Care Caedyn Tassinari: [name] Other Clinician: Referring [name]: Treating [name]/Extender: [name] in  Treatment: [phone] Encounter Discharge Information Items Post Procedure Vitals Discharge Condition: Stable Temperature (F): 98.1 Ambulatory Status: Ambulatory Pulse (bpm): 68 Discharge Destination: Home Respiratory Rate (breaths/min): 20 Transportation: Private Auto Blood Pressure (mmHg): 170/69 Accompanied By: self Schedule Follow-up Appointment: Yes Clinical Summary of Care: Patient Declined Electronic Signature(s) Signed: 05/16/2023 4:22:05 PM By: [name] Entered By: [name] on 05/16/2023 10:47:32 [name], [name] ([id number]) [id number] Page 2 of 12 -------------------------------------------------------------------------------- Lower Extremity Assessment Details Patient Name: Date of Service: [name], [name] 05/16/2023 10:00 A M Medical Record Number: [id number] Patient Account Number: [id number] Date of Birth/Sex: Treating RN: [birth year] (72 y.o. [name] Primary Care Nicklaus Alviar: [name] Other Clinician: Referring Yadriel Kerrigan: Treating [name]/Extender: [name] in Treatment: 109 Edema Assessment Assessed: [name]: No] [name]: No] Edema: [Left: Ye] [Right: s] Calf Left: Right: Point of Measurement: 41 cm From Medial Instep 46.8 cm Ankle Left: Right: Point of Measurement: 10 cm From Medial Instep 28.6 cm Vascular Assessment Pulses: Dorsalis Pedis Palpable: [Left:Yes] Extremity colors, hair growth, and conditions: Extremity Color: [Left:Hyperpigmented] Hair Growth on Extremity: [Left:No] Temperature of Extremity: [Left:Warm] Capillary Refill: [Left:< 3 seconds] Dependent Rubor: [Left:No No] Electronic Signature(s) Signed: 05/16/2023 4:22:05 PM By: [name] Entered By: [name] on 05/16/2023 10:08:20 -------------------------------------------------------------------------------- Multi Wound Chart Details Patient Name: Date of Service: [name] Grapes.  05/16/2023 10:00 A M Medical Record Number: [id number] Patient Account Number: [id number] Date of Birth/Sex: Treating RN: 1950/10/20 (72 y.o. 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 Primary Etiology: Extremity Extremity Glaucoma, Sleep Apnea, Glaucoma, Sleep Apnea, Glaucoma,  Sleep Apnea, Comorbid History: Hypertension, Peripheral Arterial Hypertension, Peripheral Arterial Hypertension, Peripheral Arterial Disease, Peripheral Venous Disease, Disease, Peripheral Venous Disease, Disease, Peripheral Venous Disease, Type II Diabetes, Gout, Osteoarthritis, Type II Diabetes, Gout, Osteoarthritis, Type II Diabetes, Gout, Osteoarthritis, Neuropathy Neuropathy Neuropathy 08/23/2020 06/03/2021 05/02/2023 Date Acquired: 109 101 2 Weeks of Treatment: Open Open Open Wound Status: Yes Yes No Wound Recurrence: No Yes No Clustered Wound: N/A 0 N/A Clustered Quantity: 1.5x2.1x0.2 1.2x0.9x0.1 0.5x0.5x0.1 Measurements L x W x D (cm) 2.474 0.848 0.196 A (cm) : rea 0.495 0.085 0.02 Volume (cm) : 82.50% 48.60% 53.80% % Reduction in A rea: 82.50% 93.60% 52.40% % Reduction in Volume: Grade 2 Full Thickness With Exposed Support Grade 1 Classification: Structures Medium Medium Medium Exudate A mount: Serosanguineous Serous Serous Exudate Type: red, brown amber amber Exudate Color: Epibole  Distinct, outline attached Distinct, outline attached Wound Margin: Large (67-100%) Large (67-100%) Large (67-100%) Granulation A mount: Red Pink Red Granulation Quality: Small (1-33%) Small (1-33%) Small (1-33%) Necrotic A mount: Fat Layer (Subcutaneous Tissue): Yes Fat Layer (Subcutaneous Tissue): Yes Fat Layer (Subcutaneous Tissue): Yes Exposed Structures: Fascia: No Fascia: No Fascia: No Tendon: No Tendon: No Tendon: No Muscle: No Muscle: No Muscle: No Joint: No Joint: No Joint: No Bone: No Bone: No Bone: No Small (1-33%) Small (1-33%) Large (67-100%) Epithelialization: Debridement - Excisional Debridement - Excisional Debridement - Excisional Debridement: Pre-procedure Verification/Time Out 10:21 10:21 10:21 Taken: Lidocaine 4% Topical Solution Lidocaine 4% Topical Solution Lidocaine 4% Topical Solution Pain Control: Subcutaneous, Slough Subcutaneous, Slough Subcutaneous, Slough Tissue Debrided: Skin/Subcutaneous Tissue Skin/Subcutaneous Tissue Skin/Subcutaneous Tissue Level: 2.47 0.85 0.2 Debridement A (sq cm): rea Curette Curette Curette Instrument: Minimum Minimum Minimum Bleeding: Pressure Pressure Pressure Hemostasis A chieved: Procedure was tolerated well Procedure was tolerated well Procedure was tolerated well Debridement Treatment Response: 1.5x2.1x0.2 1.2x0.9x0.1 0.5x0.5x0.1 Post Debridement Measurements L x W x D (cm) 0.495 0.085 0.02 Post Debridement Volume: (cm) Callus: Yes Scarring: Yes No Abnormalities Noted Periwound Skin Texture: Dry/Scaly: Yes Dry/Scaly: Yes Dry/Scaly: Yes Periwound Skin Moisture: No Abnormalities Noted No Abnormalities Noted Hemosiderin Staining: Yes Periwound Skin Color: No Abnormality No Abnormality No Abnormality Temperature: Debridement Debridement Debridement Procedures Performed: Wound Number: 37 N/A N/A Photos: N/A N/A Left, Medial Foot N/A N/A Wound Location: Trauma N/A N/A Wounding  Event: Diabetic Wound/Ulcer of the Lower N/A N/A Primary Etiology: Extremity Glaucoma, Sleep Apnea, N/A N/A Comorbid History: Hypertension, Peripheral Arterial Disease, Peripheral Venous Disease, Type II Diabetes, Gout, Osteoarthritis, Neuropathy 04/29/2023 N/A N/A Date Acquired: 2 N/A N/A Weeks of Treatment: [name] ([id number]) [id number] Page 4 of 12 Open N/A N/A Wound Status: No N/A N/A Wound Recurrence: No N/A N/A Clustered Wound: N/A N/A N/A Clustered Quantity: 0.5x0.1x0.1 N/A N/A Measurements L x W x D (cm) 0.039 N/A N/A A (cm) : rea 0.004 N/A N/A Volume (cm) : 87.60% N/A N/A % Reduction in A rea: 87.10% N/A N/A % Reduction in Volume: Grade 1 N/A N/A Classification: Medium N/A N/A Exudate A mount: Serous N/A N/A Exudate Type: amber N/A N/A Exudate Color: Distinct, outline attached N/A N/A Wound Margin: Large (67-100%) N/A N/A Granulation A mount: Red, Pink N/A N/A Granulation Quality: Small (1-33%) N/A N/A Necrotic A mount: Fat Layer (Subcutaneous Tissue): Yes N/A N/A Exposed Structures: Fascia: No Tendon: No Muscle: No Joint: No Bone: No Large (67-100%) N/A N/A Epithelialization: Debridement - Excisional N/A N/A Debridement: Pre-procedure Verification/Time Out 10:21 N/A N/A Taken: Lidocaine 4% Topical Solution N/A N/A Pain Control: Subcutaneous, Slough N/A N/A Tissue Debrided:  Skin/Subcutaneous Tissue N/A N/A Level: 0.04 N/A N/A Debridement A (sq cm): rea Curette N/A N/A Instrument: Minimum N/A N/A Bleeding: Pressure N/A N/A Hemostasis A chieved: Procedure was tolerated well N/A N/A Debridement Treatment Response: 0.5x0.1x0.1 N/A N/A Post Debridement Measurements L x W x D (cm) 0.004 N/A N/A Post Debridement Volume: (cm) Callus: Yes N/A N/A Periwound Skin Texture: No Abnormalities Noted N/A N/A Periwound Skin Moisture: No Abnormalities Noted N/A N/A Periwound Skin Color: No Abnormality  N/A N/A Temperature: Debridement N/A N/A Procedures Performed: Treatment Notes Electronic Signature(s) Signed: 05/16/2023 10:27:07 AM By: [name] MD FACS Entered By: [name] on 05/16/2023 10:27:07 -------------------------------------------------------------------------------- Multi-Disciplinary Care Plan Details Patient Name: Date of Service: [name] Grapes. 05/16/2023 10:00 A M Medical Record Number: [id number] Patient Account Number: [id number] Date of Birth/Sex: Treating RN: 06/13/1951 (72 y.o. [name] Primary Care Lee Kuang: [name] Other Clinician: Referring Shareta Fishbaugh: Treating Tenley Winward/Extender: [name] in Treatment: 109 Multidisciplinary Care Plan reviewed with physician Active Inactive Venous Leg Ulcer Nursing Diagnoses: Knowledge deficit related to disease process and management Potential for venous Insuffiency (use before diagnosis confirmed) Goals: [name] ([id number]) [id number] Page 5 of 12 Patient will maintain optimal edema control Date Initiated: 07/27/2021 Target Resolution Date: 06/21/2023 Goal Status: Active Interventions: Assess peripheral edema status every visit. Treatment Activities: Therapeutic compression applied : 07/27/2021 Notes: Wound/Skin Impairment Nursing Diagnoses: Impaired tissue integrity Knowledge deficit related to ulceration/compromised skin integrity Goals: Patient will have a decrease in wound volume by X% from date: (specify in notes) Date Initiated: 04/12/2021 Date Inactivated: 01/04/2022 Target Resolution Date: 04/23/2021 Goal Status: Met Patient/caregiver will verbalize understanding of skin care regimen Date Initiated: 01/04/2022 Target Resolution Date: 06/21/2023 Goal Status: Active Ulcer/skin breakdown will have a volume reduction of 30% by week 4 Date Initiated: 04/12/2021 Date Inactivated: 04/27/2021 Target Resolution Date:  04/27/2021 Goal Status: Unmet Unmet Reason: infection Ulcer/skin breakdown will have a volume reduction of 50% by week 8 Date Initiated: 04/27/2021 Date Inactivated: 06/29/2021 Target Resolution Date: 06/24/2021 Goal Status: Met Interventions: Assess patient/caregiver ability to obtain necessary supplies Assess patient/caregiver ability to perform ulcer/skin care regimen upon admission and as needed Assess ulceration(s) every visit Notes: Electronic Signature(s) Signed: 05/16/2023 4:22:05 PM By: [name] Entered By: [name] on 05/16/2023 10:03:07 -------------------------------------------------------------------------------- Pain Assessment Details Patient Name: Date of Service: [name] Grapes. 05/16/2023 10:00 A M Medical Record Number: [id number] Patient Account Number: [id number] Date of Birth/Sex: Treating RN: 1950-07-16 (72 y.o. [name] Primary Care Dennette Faulconer: [name] Other Clinician: Referring Pami Wool: Treating Lynnsie Linders/Extender: [name] in Treatment: 109 Active Problems Location of Pain Severity and Description of Pain Patient Has Paino No Site Locations Rate the pain. [name], [name] ([id number]) [id number] Page 6 of 12 Rate the pain. Current Pain Level: 0 Pain Management and Medication Current Pain Management: Electronic Signature(s) Signed: 05/16/2023 4:22:05 PM By: [name] Entered By: [name] on 05/16/2023 10:02:43 -------------------------------------------------------------------------------- Patient/Caregiver Education Details Patient Name: Date of Service: [name], [name] 11/21/2024andnbsp10:00 A M Medical Record Number: [id number] Patient Account Number: [id number] Date of Birth/Gender: Treating RN: July 01, 1950 (72 y.o. [name] Primary Care Physician: [name] Other Clinician: Referring Physician: Treating Physician/Extender:  [name] in Treatment: 109 Education Assessment Education Provided To: Patient Education Topics Provided Wound/Skin Impairment: Methods: Explain/Verbal Responses: Reinforcements needed, State content correctly Electronic Signature(s) Signed: 05/16/2023 4:22:05 PM By: [name] Entered By: [name] on 05/16/2023 10:03:26 -------------------------------------------------------------------------------- Wound Assessment Details Patient  Name: Date of Service: [name], [name] 05/16/2023 10:00 A M Medical Record Number: [id number] Patient Account Number: [id number] Date of Birth/Sex: Treating RN: [birth year] (72 y.o. [name] Primary Care Mackayla Mullins: [name] Other Clinician: Referring Inge Waldroup: Treating Charlotta Lapaglia/Extender: Zyler, Gaye, [name] ([id number]) [id number] Page 7 of 12 Weeks in Treatment: 109 Wound Status Wound Number: 18R Primary Diabetic Wound/Ulcer of the Lower Extremity Etiology: Wound Location: Left, Plantar Metatarsal head first Wound Open Wounding Event: Gradually Appeared Status: Date Acquired: 08/23/2020 Comorbid Glaucoma, Sleep Apnea, Hypertension, Peripheral Arterial Disease, Weeks Of Treatment: 109 History: Peripheral Venous Disease, Type II Diabetes, Gout, Osteoarthritis, Clustered Wound: No Neuropathy Photos Wound Measurements Length: (cm) 2.1 Width: (cm) 1.5 Depth: (cm) 0.2 Area: (cm) 2.474 Volume: (cm) 0.495 % Reduction in Area: 82.5% % Reduction in Volume: 82.5% Epithelialization: Small (1-33%) Tunneling: No Undermining: No Wound Description Classification: Grade 2 Wound Margin: Epibole Exudate Amount: Medium Exudate Type: Serosanguineous Exudate Color: red, brown Foul Odor After Cleansing: No Slough/Fibrino Yes Wound Bed Granulation Amount: Large (67-100%) Exposed Structure Granulation Quality: Red Fascia Exposed: No Necrotic  Amount: Small (1-33%) Fat Layer (Subcutaneous Tissue) Exposed: Yes Tendon Exposed: No Muscle Exposed: No Joint Exposed: No Bone Exposed: No Periwound Skin Texture Texture Color No Abnormalities Noted: No No Abnormalities Noted: Yes Callus: Yes Temperature / Pain Temperature: No Abnormality Moisture No Abnormalities Noted: Yes Treatment Notes Wound #18R (Metatarsal head first) Wound Laterality: Plantar, Left Cleanser Soap and Water Discharge Instruction: May shower and wash wound with dial antibacterial soap and water prior to dressing change. Wound Cleanser Discharge Instruction: Cleanse the wound with wound cleanser prior to applying a clean dressing using gauze sponges, not tissue or cotton balls. Peri-Wound Care Topical Primary Dressing Endoform 2x2 in Discharge Instruction: Moisten with saline Secondary Dressing WILLLIAM, DELFAVERO ([id number]) [phone].pdf Page 8 of 12 Optifoam Non-Adhesive Dressing, 4x4 in Discharge Instruction: Apply over primary dressing as directed. Woven Gauze Sponges 2x2 in Discharge Instruction: Apply over primary dressing as directed. Secured With 87M Medipore H Soft Cloth Surgical T ape, 4 x 10 (in/yd) Discharge Instruction: Secure with tape as directed. Compression Wrap Compression Stockings Add-Ons Electronic Signature(s) Signed: 05/16/2023 4:22:05 PM By: [name] Entered By: [name] on 05/16/2023 10:45:35 -------------------------------------------------------------------------------- Wound Assessment Details Patient Name: Date of Service: [name] Grapes. 05/16/2023 10:00 A M Medical Record Number: [id number] Patient Account Number: [id number] Date of Birth/Sex: Treating RN: 07-30-1950 (72 y.o. [name] Primary Care Elaina Cara: [name] Other Clinician: Referring Anelly Samarin: Treating Gennesis Hogland/Extender: [name] in Treatment: 109 Wound Status Wound  Number: 22R Primary Cyst Etiology: Wound Location: Left, Proximal, Lateral Lower Leg Wound Open Wounding Event: Bump Status: Date Acquired: 06/03/2021 Comorbid Glaucoma, Sleep Apnea, Hypertension, Peripheral Arterial Disease, Weeks Of Treatment: 101 History: Peripheral Venous Disease, Type II Diabetes, Gout, Osteoarthritis, Clustered Wound: Yes Neuropathy Photos Wound Measurements Length: (cm) Width: (cm) Depth: (cm) Clustered Quantity: Area: (cm) Volume: (cm) 1.2 % Reduction in Area: 48.6% 0.9 % Reduction in Volume: 93.6% 0.1 Epithelialization: Small (1-33%) 0 Tunneling: No 0.848 Undermining: No 0.085 Wound Description Classification: Full Thickness With Exposed Support Structures Wound Margin: Distinct, outline attached Exudate Amount: Medium Exudate Type: Serous Exudate Color: amber Foul Odor After Cleansing: No Slough/Fibrino Yes Wound Bed [name] ([id number]) [id number] Page 9 of 12 Granulation Amount: Large (67-100%) Exposed Structure Granulation Quality: Pink Fascia Exposed: No Necrotic Amount: Small (1-33%) Fat Layer (Subcutaneous Tissue) Exposed: Yes Necrotic Quality: Adherent Slough Tendon Exposed: No Muscle Exposed:  No Joint Exposed: No Bone Exposed: No Periwound Skin Texture Texture Color No Abnormalities Noted: No No Abnormalities Noted: Yes Scarring: Yes Temperature / Pain Temperature: No Abnormality Moisture No Abnormalities Noted: Yes Treatment Notes Wound #22R (Lower Leg) Wound Laterality: Left, Lateral, Proximal Cleanser Soap and Water Discharge Instruction: May shower and wash wound with dial antibacterial soap and water prior to dressing change. Wound Cleanser Discharge Instruction: Cleanse the wound with wound cleanser prior to applying a clean dressing using gauze sponges, not tissue or cotton balls. Peri-Wound Care Topical Primary Dressing Maxorb Extra Ag+ Alginate Dressing, 2x2 (in/in) Discharge  Instruction: Apply to wound bed as instructed Secondary Dressing Woven Gauze Sponge, Non-Sterile 4x4 in Discharge Instruction: Apply over primary dressing as directed. Secured With 77M Medipore H Soft Cloth Surgical T ape, 4 x 10 (in/yd) Discharge Instruction: Secure with tape as directed. Compression Wrap Compression Stockings Add-Ons Electronic Signature(s) Signed: 05/16/2023 4:22:05 PM By: [name] Entered By: [name] on 05/16/2023 10:13:03 -------------------------------------------------------------------------------- Wound Assessment Details Patient Name: Date of Service: [name] Grapes. 05/16/2023 10:00 A M Medical Record Number: [id number] Patient Account Number: [id number] Date of Birth/Sex: Treating RN: April 25, 1951 (72 y.o. [name] Primary Care Itzayana Pardy: [name] Other Clinician: Referring Jeyli Zwicker: Treating Lyda Colcord/Extender: [name] in Treatment: 109 Wound Status Wound Number: 36 Primary Diabetic Wound/Ulcer of the Lower Extremity Etiology: Wound Location: Left, Anterior Lower Leg Wound Open Wounding Event: Skin Tear/Laceration Status: Date Acquired: 05/02/2023 Comorbid Glaucoma, Sleep Apnea, Hypertension, Peripheral Arterial Disease, Weeks Of Treatment: 2 History: Peripheral Venous Disease, Type II Diabetes, Gout, Osteoarthritis, Clustered Wound: No [name] ([id number]) [id number] Page 10 of 12 Clustered Wound: No Neuropathy Photos Wound Measurements Length: (cm) 0.5 Width: (cm) 0.5 Depth: (cm) 0.1 Area: (cm) 0.196 Volume: (cm) 0.02 % Reduction in Area: 53.8% % Reduction in Volume: 52.4% Epithelialization: Large (67-100%) Tunneling: No Undermining: No Wound Description Classification: Grade 1 Wound Margin: Distinct, outline attached Exudate Amount: Medium Exudate Type: Serous Exudate Color: amber Foul Odor After Cleansing: No Slough/Fibrino Yes Wound  Bed Granulation Amount: Large (67-100%) Exposed Structure Granulation Quality: Red Fascia Exposed: No Necrotic Amount: Small (1-33%) Fat Layer (Subcutaneous Tissue) Exposed: Yes Necrotic Quality: Adherent Slough Tendon Exposed: No Muscle Exposed: No Joint Exposed: No Bone Exposed: No Periwound Skin Texture Texture Color No Abnormalities Noted: Yes No Abnormalities Noted: No Hemosiderin Staining: Yes Moisture No Abnormalities Noted: Yes Temperature / Pain Temperature: No Abnormality Treatment Notes Wound #36 (Lower Leg) Wound Laterality: Left, Anterior Cleanser Soap and Water Discharge Instruction: May shower and wash wound with dial antibacterial soap and water prior to dressing change. Wound Cleanser Discharge Instruction: Cleanse the wound with wound cleanser prior to applying a clean dressing using gauze sponges, not tissue or cotton balls. Peri-Wound Care Topical Primary Dressing Maxorb Extra Ag+ Alginate Dressing, 2x2 (in/in) Discharge Instruction: Apply to wound bed as instructed Secondary Dressing Zetuvit Plus Silicone Border Dressing 3x3 (in/in) Discharge Instruction: Apply silicone border over primary dressing as directed. Secured With [employer] Compression Stockings [name], [name] ([id number]) [id number] Page 11 of 12 Add-Ons Electronic Signature(s) Signed: 05/16/2023 4:22:05 PM By: [name] Entered By: [name] on 05/16/2023 10:13:42 -------------------------------------------------------------------------------- Wound Assessment Details Patient Name: Date of Service: [name], [name] 05/16/2023 10:00 A M Medical Record Number: [id number] Patient Account Number: [id number] Date of Birth/Sex: Treating RN: 13-May-1951 (72 y.o. [name] Primary Care Stephenia Vogan: [name] Other Clinician: Referring Natori Gudino: Treating Arlenne Kimbley/Extender: [name] in Treatment:  [phone]  Wound Status Wound Number: 37 Primary Diabetic Wound/Ulcer of the Lower Extremity Etiology: Wound Location: Left, Medial Foot Wound Open Wounding Event: Trauma Status: Date Acquired: 04/29/2023 Comorbid Glaucoma, Sleep Apnea, Hypertension, Peripheral Arterial Disease, Weeks Of Treatment: 2 History: Peripheral Venous Disease, Type II Diabetes, Gout, Osteoarthritis, Clustered Wound: No Neuropathy Photos Wound Measurements Length: (cm) 0.5 Width: (cm) 0.1 Depth: (cm) 0.1 Area: (cm) 0.039 Volume: (cm) 0.004 % Reduction in Area: 87.6% % Reduction in Volume: 87.1% Epithelialization: Large (67-100%) Tunneling: No Undermining: No Wound Description Classification: Grade 1 Wound Margin: Distinct, outline attached Exudate Amount: Medium Exudate Type: Serous Exudate Color: amber Foul Odor After Cleansing: No Slough/Fibrino Yes Wound Bed Granulation Amount: Large (67-100%) Exposed Structure Granulation Quality: Red, Pink Fascia Exposed: No Necrotic Amount: Small (1-33%) Fat Layer (Subcutaneous Tissue) Exposed: Yes Necrotic Quality: Adherent Slough Tendon Exposed: No Muscle Exposed: No Joint Exposed: No Bone Exposed: No Periwound Skin Texture Texture Color No Abnormalities Noted: No No Abnormalities Noted: Yes Callus: Yes Temperature / Pain Temperature: No [address] [name], [name] ([id number]) [id number] Page 12 of 12 No Abnormalities Noted: Yes Treatment Notes Wound #37 (Foot) Wound Laterality: Left, Medial Cleanser Soap and Water Discharge Instruction: May shower and wash wound with dial antibacterial soap and water prior to dressing change. Wound Cleanser Discharge Instruction: Cleanse the wound with wound cleanser prior to applying a clean dressing using gauze sponges, not tissue or cotton balls. Peri-Wound Care Topical Primary Dressing Endoform 2x2 in Discharge Instruction: Moisten with saline Secondary Dressing Optifoam  Non-Adhesive Dressing, 4x4 in Discharge Instruction: Apply over primary dressing as directed. Woven Gauze Sponges 2x2 in Discharge Instruction: Apply over primary dressing as directed. Secured With 54M Medipore H Soft Cloth Surgical T ape, 4 x 10 (in/yd) Discharge Instruction: Secure with tape as directed. Compression Wrap Compression Stockings Add-Ons Electronic Signature(s) Signed: 05/16/2023 4:22:05 PM By: [name] Entered By: [name] on 05/16/2023 10:14:15 -------------------------------------------------------------------------------- Vitals Details Patient Name: Date of Service: [name] Grapes. 05/16/2023 10:00 A M Medical Record Number: [id number] Patient Account Number: [id number] Date of Birth/Sex: Treating RN: 05-17-1951 (72 y.o. [name] Primary Care Amritha Yorke: [name] Other Clinician: Referring Elyn Krogh: Treating Serayah Yazdani/Extender: [name] in Treatment: 109 Vital Signs Time Taken: 10:05 Temperature (F): 98.1 Height (in): 74 Pulse (bpm): 68 Weight (lbs): 238 Respiratory Rate (breaths/min): 20 Body Mass Index (BMI): 30.6 Blood Pressure (mmHg): 170/69 Reference Range: 80 - 120 mg / dl Electronic Signature(s) Signed: 05/16/2023 4:22:05 PM By: [name] Entered By: [name] on 05/16/2023 10:05:51

## 2023-05-16 NOTE — Progress Notes (Addendum)
MUAAZ, ROUTT ([id number]) [id number] Page 1 of 25 Visit Report for 05/16/2023 Chief Complaint Document Details Patient Name: Date of Service: [name], [name] 05/16/2023 10:00 A M Medical Record Number: [id number] Patient Account Number: [id number] Date of Birth/Sex: Treating RN: 05-13-1951 (72 y.o. M) Primary Care Provider: [name] Other Clinician: Referring Provider: Treating Provider/Extender: [name] in Treatment: 109 Information Obtained from: Patient Chief Complaint Left leg and foot ulcers 04/12/2021; patient is here for wounds on his left lower leg and left plantar foot over the first metatarsal head Electronic Signature(s) Signed: 05/16/2023 10:27:16 AM By: [name] MD FACS Entered By: [name] on 05/16/2023 10:27:16 -------------------------------------------------------------------------------- Debridement Details Patient Name: Date of Service: [name] Grapes. 05/16/2023 10:00 A M Medical Record Number: [id number] Patient Account Number: [id number] Date of Birth/Sex: Treating RN: 12/07/1950 (72 y.o. [name] Primary Care Provider: [name] Other Clinician: Referring Provider: Treating Provider/Extender: [name] in Treatment: 109 Debridement Performed for Assessment: Wound #37 Left,Medial Foot Performed By: Physician [name], MD The following information was scribed by: [name] The information was scribed for: [name] Debridement Type: Debridement Severity of Tissue Pre Debridement: Fat layer exposed Level of Consciousness (Pre-procedure): Awake and Alert Pre-procedure Verification/Time Out Yes - 10:21 Taken: Start Time: 10:21 Pain Control: Lidocaine 4% T opical Solution Percent of Wound Bed Debrided: 100% T Area Debrided (cm): otal 0.04 Tissue and other material debrided: Non-Viable, Slough, Subcutaneous, Skin:  Epidermis, Slough Level: Skin/Subcutaneous Tissue Debridement Description: Excisional Instrument: Curette Bleeding: Minimum Hemostasis Achieved: Pressure Response to Treatment: Procedure was tolerated well Level of Consciousness (Post- Awake and Alert procedure): Post Debridement Measurements of Total Wound Length: (cm) 0.5 Width: (cm) 0.1 Depth: (cm) 0.1 Volume: (cm) 0.004 Character of Wound/Ulcer Post Debridement: Improved [name] ([id number]) [id number] Page 2 of 25 Severity of Tissue Post Debridement: Fat layer exposed Post Procedure Diagnosis Same as Pre-procedure Electronic Signature(s) Signed: 05/16/2023 11:02:44 AM By: [name] MD FACS Signed: 05/16/2023 4:22:05 PM By: [name] Entered By: [name] on 05/16/2023 10:21:58 -------------------------------------------------------------------------------- Debridement Details Patient Name: Date of Service: [name] Grapes. 05/16/2023 10:00 A M Medical Record Number: [id number] Patient Account Number: [id number] Date of Birth/Sex: Treating RN: 24-Jul-1950 (72 y.o. [name] Primary Care Provider: [name] Other Clinician: Referring Provider: Treating Provider/Extender: [name] in Treatment: 109 Debridement Performed for Assessment: Wound #36 Left,Anterior Lower Leg Performed By: Physician [name], MD The following information was scribed by: [name] The information was scribed for: [name] Debridement Type: Debridement Severity of Tissue Pre Debridement: Fat layer exposed Level of Consciousness (Pre-procedure): Awake and Alert Pre-procedure Verification/Time Out Yes - 10:21 Taken: Start Time: 10:21 Pain Control: Lidocaine 4% T opical Solution Percent of Wound Bed Debrided: 100% T Area Debrided (cm): otal 0.2 Tissue and other material debrided: Non-Viable, Slough, Subcutaneous,  Slough Level: Skin/Subcutaneous Tissue Debridement Description: Excisional Instrument: Curette Bleeding: Minimum Hemostasis Achieved: Pressure Response to Treatment: Procedure was tolerated well Level of Consciousness (Post- Awake and Alert procedure): Post Debridement Measurements of Total Wound Length: (cm) 0.5 Width: (cm) 0.5 Depth: (cm) 0.1 Volume: (cm) 0.02 Character of Wound/Ulcer Post Debridement: Improved Severity of Tissue Post Debridement: Fat layer exposed Post Procedure Diagnosis Same as Pre-procedure Electronic Signature(s) Signed: 05/16/2023 11:02:44 AM By: [name] MD FACS Signed: 05/16/2023 4:22:05 PM By: [name] Entered By: [name] on 05/16/2023 10:22:56 [name] ([id number]) [id number] Page 3 of 25 -------------------------------------------------------------------------------- Debridement  Details Patient Name: Date of Service: [name], [name] 05/16/2023 10:00 A M Medical Record Number: [id number] Patient Account Number: [id number] Date of Birth/Sex: Treating RN: [birth year] (72 y.o. [name] Primary Care Provider: [name] Other Clinician: Referring Provider: Treating Provider/Extender: [name] in Treatment: 109 Debridement Performed for Assessment: Wound #22R Left,Proximal,Lateral Lower Leg Performed By: Physician [name], MD The following information was scribed by: [name] The information was scribed for: [name] Debridement Type: Debridement Level of Consciousness (Pre-procedure): Awake and Alert Pre-procedure Verification/Time Out Yes - 10:21 Taken: Start Time: 10:21 Pain Control: Lidocaine 4% T opical Solution Percent of Wound Bed Debrided: 100% T Area Debrided (cm): otal 0.85 Tissue and other material debrided: Non-Viable, Slough, Subcutaneous, Slough Level: Skin/Subcutaneous Tissue Debridement Description:  Excisional Instrument: Curette Bleeding: Minimum Hemostasis Achieved: Pressure Response to Treatment: Procedure was tolerated well Level of Consciousness (Post- Awake and Alert procedure): Post Debridement Measurements of Total Wound Length: (cm) 1.2 Width: (cm) 0.9 Depth: (cm) 0.1 Volume: (cm) 0.085 Character of Wound/Ulcer Post Debridement: Improved Post Procedure Diagnosis Same as Pre-procedure Electronic Signature(s) Signed: 05/16/2023 11:02:44 AM By: [name] MD FACS Signed: 05/16/2023 4:22:05 PM By: [name] Entered By: [name] on 05/16/2023 10:23:52 -------------------------------------------------------------------------------- Debridement Details Patient Name: Date of Service: [name] Grapes. 05/16/2023 10:00 A M Medical Record Number: [id number] Patient Account Number: [id number] Date of Birth/Sex: Treating RN: [birth year] (72 y.o. [name] Primary Care Provider: [name] Other Clinician: Referring Provider: Treating Provider/Extender: [name] in Treatment: 109 Debridement Performed for Assessment: Wound #18R Left,Plantar Metatarsal head first Performed By: Physician [name], MD The following information was scribed by: [name] The information was scribed for: [name] Debridement Type: Debridement Severity of Tissue Pre Debridement: Fat layer exposed Level of Consciousness (Pre-procedure): Awake and Alert Pre-procedure Verification/Time Out Yes - 10:21 Taken: Start Time: 10:21 Pain Control: Lidocaine 4% T opical Solution Percent of Wound Bed Debrided: 100% T Area Debrided (cm): otal 2.47 Tissue and other material debrided: Non-Viable, Slough, Subcutaneous, Skin: [name], [name], [name] ([id number]) [id number] Page 4 of 25 Level: Skin/Subcutaneous Tissue Debridement Description: Excisional Instrument: Curette Bleeding:  Minimum Hemostasis Achieved: Pressure Response to Treatment: Procedure was tolerated well Level of Consciousness (Post- Awake and Alert procedure): Post Debridement Measurements of Total Wound Length: (cm) 2.1 Width: (cm) 1.5 Depth: (cm) 0.2 Volume: (cm) 0.495 Character of Wound/Ulcer Post Debridement: Improved Severity of Tissue Post Debridement: Fat layer exposed Post Procedure Diagnosis Same as Pre-procedure Electronic Signature(s) Signed: 05/16/2023 11:02:44 AM By: [name] MD FACS Signed: 05/16/2023 4:22:05 PM By: [name] Entered By: [name] on 05/16/2023 10:45:51 -------------------------------------------------------------------------------- HPI Details Patient Name: Date of Service: [name] Grapes. 05/16/2023 10:00 A M Medical Record Number: [id number] Patient Account Number: [id number] Date of Birth/Sex: Treating RN: January 06, 1951 (72 y.o. M) Primary Care Provider: [name] Other Clinician: Referring Provider: Treating Provider/Extender: [name] in Treatment: 109 History of Present Illness HPI Description: 10/11/17; Mr. Grgurich is a 72 year old man who tells me that in 2015 he slipped down the latter traumatizing his left leg. He developed a wound in the same spot the area that we are currently looking at. He states this closed over for the most part although he always felt it was somewhat unstable. In 2016 he hit the same area with the door of his car had this reopened. He tells me that this is never really closed although sometimes an inflow  it remains open on a constant basis. He has not been using any specific dressing to this except for topical antibiotics the nature of which were not really sure. His primary doctor did send him to see Dr. Jacinto Halim of interventional cardiology. He underwent an angiogram on 08/06/17 and he underwent a PTA and directional atherectomy of the lesser distal SFA and popliteal arteries  which resulted in brisk improvement in blood flow. It was noted that he had 2 vessel runoff through the anterior tibial and peroneal. He is also been to see vascular and interventional radiologist. He was not felt to have any significant superficial venous insufficiency. Presumably is not a candidate for any ablation. It was suggested he come here for wound care. The patient is a type II diabetic on insulin. He also has a history of venous insufficiency. ABIs on the left were noncompressible in our clinic 10/21/17; patient we admitted to the clinic last week. 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 We put Iodosorb on him after an aggressive debridement and 3 layer compression. He complained of pain in his ankle and itching with is skin in fact he scratched the area on the medial calf superiorly at the rim of our wraps and he has 2 small open areas in that location today which are new. I changed his primary dressing today to silver collagen. As noted he is already had revascularization and does not have any significant superficial venous insufficiency that would be amenable to ablation 10/28/17; patient admitted to the clinic 2 weeks ago. He has a smaller Wound. Scratch injury from last week revealed. There is large wound over the tibial area. This is smaller. Granulation looks healthy. No need for debridement. 11/04/17; the wound on the left lateral calf looks better. Improved dimensions. Surface of this looks better. We've been maintaining him and Kerlix Coban wraps. He finds this much more comfortable. Silver collagen dressing 11/11/17; left lateral Wound continues to look healthy be making progress. Using a #5 curet I removed removed nonviable skin from the surface of the wound and then necrotic debris from the wound surface. Surface of the wound continues to look healthy. He also has an open area on the left great toenail bed. We've  been using topical antibiotics. 11/19/17; left anterior lateral wound continues to look healthy but it's not closed. He also had a small wound above this on the left leg Initially traumatic wounds in the setting of significant chronic venous insufficiency and stasis dermatitis 11/25/17; left anterior wounds superiorly is closed still a small wound inferiorly. 12/02/17; left anterior tibial area. Arrives today with adherent callus. Post debridement clearly not completely closed. Hydrofera Blue under 3 layer compression. 12/09/17; left anterior tibia. Circumferential eschar however the wound bed looks stable to improved. We've been using Hydrofera Blue under 3 layer compression 12/17/17; left anterior tibia. Apparently this was felt to be closed however when the wrap was taken off there is a skin tear to reopen wounds in the same area we've been using Hydrofera Blue under 3 layer compression [name], [name] ([id number]) [id number] Page 5 of 25 12/23/17 left anterior tibia. Not close to close this week apparently the [facility] was stuck to this again. Still circumferential eschar requiring debridement. I put a contact layer on this this time under the Hydrofera Blue 12/31/17; left anterior tibia. Wound is better slight amount of hyper-granulation. Using Hydrofera Blue over Adaptic. 01/07/18; left anterior tibia.  The wound had some surface eschar however after this was removed he has no open wound.he was already revascularized by Dr. Jacinto Halim when he came to our clinic with atherectomy of the left SFA and popliteal artery. He was also sent to interventional radiology for venous reflux studies. He was not felt to have significant reflux but certainly has chronic venous changes of his skin with hemosiderin deposition around this area. He will definitely need to lubricate his skin and wear compression stocking and I've talked to him about this. READMISSION 05/26/2018 This is a now  72 year old man we cared for with traumatic wounds on his left anterior lower extremity. He had been previously revascularized during that admission by Dr. Jacinto Halim. Apparently in follow-up Dr. Jacinto Halim noted that he had deterioration in his arterial status. He underwent a stent placement in the distal left SFA on 04/22/2018. 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 He went back to the angiography suite on 04/30/2018 he underwent PTA and balloon angioplasty of the occluded left mid anterior tibial artery, thrombotic occlusion went from 100 to 0% which reconstitutes the posterior tibial artery. He had thrombectomy and aspiration of the peroneal artery. The stent placed in the distal SFA left SFA was still occluded. 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 He has been recuperating at [facility] home in [facility] after the most recent hospitalization. He is going home tomorrow. He tells me that 2 weeks ago he traumatized the tip of his left fifth toe. He came in urgently for our review of this. This was a history of before I noted that Dr. Jacinto Halim had already noted dry gangrenous changes of the left fifth toe 06/09/2018; 2-week follow-up. I did contact Dr. Jacinto Halim after his last appointment and he apparently saw 1 of Dr. Verl Dicker colleagues the next day. He does not follow-up with Dr. Jacinto Halim himself until Thursday of this week. 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 Nevertheless there is no evidence of infection no drainage and no pain. He had a new area that this week when we were signing him in today on the left anterior mid tibia area, this is in close proximity to the previous wound we have dealt with in this  clinic. 06/23/2018; 2-week follow-up. I did not receive a recent note from Dr. Jacinto Halim to review today. Our office is trying to obtain this. He is apparently not planning to do further vascular interventions and wondered about compression to try and help with the patient's chronic venous insufficiency. However we are also concerned about the arterial flow. He arrives in clinic today with a new area on the left third toe. The areas on the calf/anterior tibia are close to closing. The left fifth toe is still mummified using Betadine. -In reviewing things with the patient he has what sounds like claudication with mild to moderate amount of activity. 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 I prescribed Levaquin over the phone while we attempted to arrange a plan of care. However the patient called yesterday to report he had low-grade fever and he came in today acutely. There is been a marked deterioration in the left third toe with spreading cellulitis  up into the dorsal left foot. He was referred to the emergency room. Readmission: 06/29/2020 patient presents today for reevaluation here in our clinic he was previously treated by Dr. Leanord Hawking at the latter part of 2019 in 2 the beginning of 2020. Subsequently we have not seen him since that time in the interim he did have evaluation with vein and vascular specialist specifically Dr. Bo Mcclintock who did perform quite extensive work for a left femoral to anterior tibial artery bypass. With that being said in the interim the patient has developed significant lymphedema and has wounds that he tells me have really never healed in regard to the incision site on the left leg. He also has multiple wounds on the feet for various reasons some of which is that he tends to pick at his feet. Fortunately there is no signs of active infection systemically at this time he does have some wounds that are little bit deeper but most are fairly  superficial he seems to have good blood flow and overall everything appears to be healthy I see no bone exposed and no obvious signs of osteomyelitis. I do not know that he necessarily needs a x-ray at this point although that something we could consider depending on how things progress. The patient does have a history of lymphedema, diabetes, this is type II, chronic kidney disease stage III, hypertension, and history of peripheral vascular disease. 07/05/2020; patient admitted last week. Is a patient I remember from 2019 he had a spreading infection involving the left foot and we sent him to the hospital. 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 He subsequently had a left femoral to anterior tibial bypass by Dr.Cain vein and vascular. He also has severe lymphedema with chronic skin changes related to that on the left leg. The most problematic area that was new today was on the left medial great toe. This was apparently a small area last week there was purulent drainage which our intake nurse cultured. Also areas on the left medial foot and heel left lateral foot. He has 2 areas on the left medial calf left lateral calf in the setting of the severe lymphedema. 07/13/2020 on evaluation today patient appears to be doing better in my opinion compared to his last visit. The good news is there is no signs of active infection systemically and locally I do not see any signs of infection either. He did have an x-ray which was negative that is great news he had a culture which showed MRSA but at the same time he is been on the doxycycline which has helped. I do think we may want to extend this for 7 additional days 1/25; patient admitted to the clinic a few weeks ago. 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 We have been putting him under compression his edema control is a lot better but he is severe verricused skin on the  left leg. He is really done quite well he still has an open area on the left medial calf and the left medial first metatarsal head. We have been using silver collagen on the leg silver alginate on the foot 07/27/2020 upon evaluation today patient appears to be doing decently well in regard to his wounds. He still has a lot of dry skin on the left leg. Some of this is starting to peel back and I think he may be able to have them  out by removing some that today. Fortunately there is no signs of active infection at this time on the left leg although on the right leg he does appear to have swelling and erythema as well as some mild warmth to touch. This does have been concerned about the possibility of cellulitis although within the differential diagnosis I do think that potentially a DVT has to be at least considered. We need to rule that out before proceeding would just call in the cellulitis. Especially since he is having pain in the posterior aspect of his calf muscle. 2/8; the patient had seen sparingly. He has severe skin changes of chronic lymphedema in the left leg thickened hyperkeratotic verrucous skin. He has an open wound on the medial part of the left first met head left mid tibia. He also has a rim of nonepithelialized skin in the anterior mid tibia. He brought in the AmLactin lotion that was been prescribed although I am not sure under compression and its utility. There concern about cellulitis on the right lower leg the last time he was here. He was put on on antibiotics. His DVT rule out was negative. The right leg looks fine he is using his stocking on this area 08/10/2020 upon evaluation today patient appears to be doing well with regard to his leg currently. He has been tolerating the dressing changes without complication. Fortunately there is no signs of active infection which is great news. Overall very pleased with where things stand. 2/22; the patient still has an area on the medial  part of the left first met his head. This looks better than when I last saw this earlier this month he has a rim of epithelialization but still some surface debris. Mostly everything on the left leg is healed. There is still a vulnerable in the left mid tibia area. 08/30/2020 upon evaluation today patient appears to be doing much better in regard to his wounds on his foot. 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 Nonetheless I think we will address that today I Minna send in a prescription for him in that regard. Overall though there does not appear to be any signs of significant worsening. 09/07/2020 on evaluation today patient's wounds over his left foot appear to be doing excellent. I do not see any signs of infection there is some callus buildup this can require debridement for certain but overall I feel like he is managing quite nicely. He still using the AmLactin cream which has been beneficial for him as well. 3/22; left foot wound is closed. There is no open area here. He is using ammonium lactate lotion to the lower extremities to help exfoliate dry cracked skin. He has compression stockings from elastic therapy in [place]. The wound on the medial part of his left first met head is healed today. READMISSION 04/12/2021 [name] ([id number]) [id number] Page 6 of 25 Mr. Pare is a patient we know fairly well he had a prolonged stay in clinic in 2019 with wounds on his left lateral and left anterior lower extremity in the setting of chronic venous insufficiency. More recently he was here earlier this year with predominantly an area on his left foot first metatarsal head plantar and he says the plantar foot broke down on its not long after we discharged him but he did not come back here. The last  few months areas of broken down  on his left anterior and again the left lateral lower extremity. The leg itself is very swollen chronically enlarged a lot of hyperkeratotic dry Berry Q skin in the left lower leg. His edema extends well into the thigh. He was seen by Dr. Randie Heinz. He had ABIs on 03/02/2021 showing an ABI on the right of 1 with a TBI of 0.72 his ABI in the left at 1.09 TBI of 0.99. Monophasic and biphasic waveforms on the right. On the left monophasic waveforms were noted he went on to have an angiogram on 03/27/2021 this showed the aortic aortic and iliac segments were free of flow-limiting stenosis the left common femoral vein to evaluate the left femoral to anterior tibial artery bypass was unobstructed the bypass was patent without any areas of stenosis. We discharged the patient in bilateral juxta lite stockings but very clearly that was not sufficient to control the swelling and maintain skin integrity. He is clearly going to need compression pumps. 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 This is fortunate because he is on his feet for long periods of time. 10/27; patient comes in with our intake nurse reporting copious amount of green drainage from the left anterior mid tibia the left dorsal foot and to a lesser extent the left medial mid tibia. We left the compression wrap on all week for the amount of edema in his left leg is quite a bit better. We use silver alginate as the primary dressing 11/3; edema control is good. Left anterior lower leg left medial lower leg and the plantar first metatarsal head. The left anterior lower leg required debridement. Deep tissue culture I did of this wound showed MRSA I put him on 10 days of doxycycline which she will start today. We have him in compression wraps. He has a security card and [employer] however he is retiring on November 15. We will need to then get him into a better offloading boot for the left foot perhaps a  total contact cast 11/10; edema control is quite good. Left anterior and left medial lower leg wounds in the setting of chronic venous insufficiency and lymphedema. He also has a substantial area over the left plantar first metatarsal head. 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 He has significant hypergranulation on the left plantar foot wound. The patient is a diabetic but he does not have significant PAD 11/17; edema control is quite good. Left anterior and left medial lower leg wounds look better. The really concerning area remains the area on the left plantar first metatarsal head. He has a rim of epithelialization. 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 Starting today with a forefoot off loader but . possibly a total contact cast. 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 Arrives with a very significant odor from I think all of the wounds on the left leg and the left foot. Because of this I did not put a total contact cast on him today but will could still consider this. His wife  was having cataract surgery which is the reason he missed the appointment 12/6. I saw this man 5 days ago with a swelling below the popliteal fossa. I thought he actually might have a Baker's cyst however the DVT rule out study that we could arrange right away was negative the technician told me this was not a ruptured Baker's cyst. We attempted to get this aspirated by under ultrasound guidance in interventional radiology however all they did was an ultrasound however it shows an extensive fluid collection 62 x 8 x 9.4 in the left thigh and left calf. The patient states he thinks this started 8 days  ago or so but he really is not complaining of any pain, fever or systemic symptoms. He has not ha 12/20; after some difficulty I managed to get the patient into see Dr. Randie Heinz. Eventually he was taken into the hospital and had a drain put in the fluid collection below his left knee posteriorly extending into the posterior thigh. He still has the drain in place. Culture of this showed moderate staff aureus few Morganella and few Klebsiella he is now on doxycycline and ciprofloxacin as suggested by infectious disease he is on this for a month. The drain will remain in place until it stops draining 12/29; he comes in today with the 1 wound on his left leg and the area on the left plantar first met head significantly smaller. Both look healthy. He still has the drain in the left leg. He says he has to change this daily. Follows up with Dr. Randie Heinz on January 11. 06/29/2021; the wounds that I am following on the left leg and left first met head continued to be quite healthy. However the area where his inferior drain is in place had copious amounts of drainage which was green in color. The wound here is larger. Follows up with Dr. Pascal Lux of vein and vascular his surgeon next week as well as infectious disease. He remains on ciprofloxacin and doxycycline. He is not complaining of excessive pain in either one of the drain areas 1/12; the patient saw vascular surgery and infectious disease. Vascular surgery has left the drain in place as there was still some notable drainage still see him back in 2 weeks. Dr. Dorthula Perfect stop the doxycycline and ciprofloxacin and I do not believe he follows up with them at this point. Culture I did last week showed both doxycycline resistant MRSA and Pseudomonas not sensitive to ciprofloxacin although only in rare titers 1/19; the patient's wound on the left anterior lower leg is just about healed. We have continued healing of the area that was medially on the left leg. Left first plantar  metatarsal head continues to get smaller. The major problem here is his 2 drain sites 1 on the left upper calf and lateral thigh. There is purulent drainage still from the left lateral thigh. I gave him antibiotics last week but we still have recultured. He has the drain in the area I think this is eventually going to have to come out. I suspect there will be a connecting wound to heal here perhaps with improved VAc 1/26; the patient had his drain removed by vein and vascular on 1/25/. This was a large pocket of fluid in his left thigh that seem to tunnel into his left upper calf. 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 His mention his Penrose drain was removed today. He now has a tunneling wound on his left calf and  left thigh. Both of these probe widely towards each other although I cannot really prove that they connect. Both wounds on his lower leg anteriorly are closed and his area over the first metatarsal head on his right foot continues to improve. We are using Hydrofera Blue here. He also saw infectious disease culture of the abscess they noted was polymicrobial with MRSA, Morganella and Klebsiella he was treated with doxycycline and ciprofloxacin for 4 weeks ending on 07/03/2021. They did not recommend any further antibiotics. Notable that while he still had the Penrose drain in place last week he had purulent drainage coming out of the inferior IandD site this grew [facility], MRSA and Pseudomonas but there does not appear to be any active infection in this area today with the drain out and he is not systemically unwell 2/2; with regards to the drain sites the superior one on the thigh actually is closed down the one on the upper left lateral calf measures about 8 and half centimeters which is an improvement seems to be less prominent although still with a lot of drainage. The only remaining wound is over the first metatarsal head on the left foot and this looks to be  continuing to improve with Hydrofera Blue. 2/9; the area on his plantar left foot continues to contract. Callus around the wound edge. The drain sites specifically have not come down in depth. We put the wound VAC on Monday he changed the canister late last night our intake nurse reported a pocket of fluid perhaps caused by our compression wraps 2/16; continued improvement in left foot plantar wound. drainage site in the calf is not improved in terms of depth (wound vac) 2/23; continued improvement in the left foot wound over the first metatarsal head. With regards to the drain sites the area on his thigh laterally is healed however the open area on his calf is small in terms of circumference by still probes in by about 15 cm. Within using the wound VAC. Hydrofera Blue on his foot 08/24/2021: The left first metatarsal head wound continues to improve. The wound bed is healthy with just some surrounding callus. Unfortunately the open drain site on his calf remains open and tunnels at least 15 cm (the extent of a Q-tip). This is despite several weeks of wound VAC treatment. Based on reading back EILAN, MATSUNAGA ([id number]) [phone].pdf Page 7 of 25 through the notes, there has been really no significant change in the depth of the wound, although the orifice is smaller and the more cranial wound on his thigh has closed. I suspect the tunnel tracks nearly all the way to this location. 08/31/2021: Continued improvement in the left first metatarsal head wound. There has been absolutely no improvement to the long tunnel from his open drain site on his calf. We have tried to get him into see vascular surgery sooner to consider the possibility of simply filleting the tract open and allowing it to heal from the bottom up, likely with a wound VAC. They have not yet scheduled a sooner appointment than his current mid April 09/14/2021: He was seen by vascular surgery and they took him to the  operating room last week. They opened a portion of the tunnel, but did not extend the entire length of the known open subcutaneous tract. I read Dr. Darcella Cheshire operative note and it is not clear from that documentation why only a portion of the tract was opened. The heaped up granulation tissue was curetted and removed  from at least some portion of the tract. They did place a wound VAC and applied an Unna boot to the leg. The ulcer on his left first metatarsal head is smaller today. The bed looks good and there is just a small amount of surrounding callus. 09/21/2021: The ulcer on his left first metatarsal head looks to be stalled. There is some callus surrounding the wound but the wound bed itself does not appear particularly dynamic. The tunnel tract on his lateral left leg seems to be roughly the same length or perhaps slightly smaller but the wound bed appears healthy with good granulation tissue. He opened up a new wound on his medial thigh and the site of a prior surgical incision. He says that he did this unconsciously in his sleep by scratching. 09/28/2021: Unfortunately, the ulcer on his left first metatarsal head has extended underneath the callus toward the dorsum of his foot. The medial thigh wounds are roughly the same. The tunnel on his lateral left leg continues to be problematic; it is longer than we are able to actually probe with a Q-tip. I am still not certain as to why Dr. Randie Heinz did not open this up entirely when he took the patient to the operating room. We will likely be back in the same situation with just a small superficial opening in a long unhealed tract, as the open portion is granulating in nicely. 10/02/2021: The patient was initially scheduled for a nurse visit, but we are also applying a total contact cast today. The plantar foot wound looks clean without significant accumulated callus. We have been applying Prisma silver collagen to the site. 10/05/2021: The patient is here for  his first total contact cast change. We have tried using gauze packing strips in the tunnel on his lateral leg wound, but this does not seem to be working any better than the white VAC foam. The foot ulcer looks about the same with minimal periwound callus. Medial thigh wound is clean with just some overlying eschar. 10/12/2021: The plantar foot wound is stable without any significant accumulation of periwound callus. The surface is viable with good granulation tissue. The medial thigh wounds are much smaller and are epithelializing. On the other hand, he had purulent drainage coming from the tunnel on his lateral leg. He does go back to see Dr. Randie Heinz next week and is planning to ask him why the wound tunnel was not completely opened at the time of his most recent operation. 10/19/2021: The plantar foot wound is markedly improved and has epithelial tissue coming through the surface. The medial thigh wounds are nearly closed with just a tiny open area. He did see Dr. Randie Heinz earlier this week and apparently they did discuss the possibility of opening the sinus tract further and enabling a wound VAC application. Apparently there are some limits as to what Dr. 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 I think if we could get the tract open to the level of the popliteal fossa, this would greatly aid in her ability to get this chart closed. That being said, however, today when I probed the tract with a Q-tip, I was not able to insert the entirety of the Q-tip as I have on previous occasions. The tunnel is shorter by about 4 cm. The surface is clean with good granulation tissue and no further episodes of purulent drainage. 10/30/2021: Last week, the patient underwent surgery and had the long tract in his leg opened. There  was a rind that was debrided, according to the operative report. His medial thigh ulcers are closed. The plantar foot wound is clean with a good surface and  some built up surrounding callus. 11/06/2021: The overall dimensions of the large wound on his lateral leg remain about the same, but there is good granulation tissue present and the tunneling is a little bit shorter. He has a new wound on his anterior tibial surface, in the same location where he had a similar lesion in the past. The plantar foot wound is clean with some buildup surrounding callus. Just toward the medial aspect of his foot, however, there is an area of darkening that once debrided, revealed another opening in the skin surface. 11/13/2021: The anterior tibial surface wound is closed. The plantar foot wound has some surrounding callus buildup. The area of darkening that I debrided last week and revealed an opening in the skin surface has closed again. The tunnel in the large wound on his lateral leg has come in by about 3 cm. There is healthy granulation tissue on the entire wound surface. 11/23/2021: The patient was out of town last week and did wet-to-dry dressings on his large wound. 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 Unfortunately, he picked open the wound on his left medial thigh. He says that it was itching and he just could not stop scratching it until it was open again. The wound on his plantar foot is smaller and has not accumulated a tremendous amount of callus. The lateral leg wound is shallower and the tunnel has also decreased in depth. There is just a little bit of slough accumulation on the surface. 11/30/2021: Another portion of his left medial thigh has been opened up. All of these wounds are fairly superficial with just a little bit of slough and eschar accumulation. The wound on his plantar foot is almost closed with just a bit of eschar and periwound callus accumulation. The lateral leg wound is nearly flush with the surrounding skin and the tunnel is markedly shallower. 12/07/2021: There is just 1  open area on his left medial thigh. It is clean with just a little bit of perimeter eschar. The wound on his plantar foot continues to contract and just has some eschar and periwound callus accumulation. The lateral leg wound is closing at the more distal aspect and the tunnel is smaller. The surface is nearly flush with the surrounding skin and it has a good bed of granulation tissue. 12/14/2021: The thigh and foot wounds are closed. The lateral leg wound has closed over approximately half of its length. The tunnel continues to contract and the surface is now flush with the surrounding skin. The wound bed has robust granulation tissue. 12/22/2021: The thigh and foot wounds have reopened. The foot wound has a lot of callus accumulation around and over it. The thigh wound is tiny with just a little bit of slough in the wound bed. The lateral leg wound continues to contract. His vascular surgeon took the wound VAC off earlier in the week and the patient has been doing wet-to-dry dressings. There is a little slough accumulation on the surface. The tunnel is about 3 cm in depth at this point. 12/28/2021: The thigh wound is closed again. The foot wound has some callus that subsequently has peeled back exposing just a small slit of a wound. The lateral leg wound Is down to about half the  size that it originally was and the tunnel is down to about half a centimeter in depth. 01/04/2022: The thigh wound remains closed. The foot wound has heavy callus overlying the wound site. Once this was debrided, the wound was found to be closed. The lateral leg wound is smaller again this week and very superficial. No tunnel could be identified. 01/12/2022: The thigh and foot wounds both remain closed. The lateral leg wound is now nearly flush with the skin surface. There is good granulation tissue present with a light layer of slough. 01/19/2022: Due to the way his wrap was placed, the patient did not change the dressing on his  thigh at all and so the foam was saturated and his skin is macerated. There is a light layer of slough on the wound surface. The underlying granulation tissue is robust and healthy-appearing. He has heavy callus buildup at the site of his first metatarsal head wound which is still healed. 02/01/2022: He has been in silver alginate. When he removed the dressing from his thigh wound, however, some leg, superficially reopening a portion of the wound that had healed. In addition, underneath the callus at his left first metatarsal head, there appears to be a blister and the wound appears to be open again. 02/08/2022: The lateral leg wound has contracted substantially. There is eschar and a light layer of slough present. He says that it is starting to pull and is uncomfortable. On inspection, there is some puckering of the scar and the eschar is quite dry; this may account for his symptoms. On his first metatarsal head, the wound is much smaller with just some eschar on the surface. The callus has not reaccumulated. He reports that he had a blister come up on his medial thigh wound at the distal aspect. It popped and there is now an opening in his skin again. Looking back through his library of wound photos, there is what [name], [name] ([id number]) [id number] Page 8 of 25 looks like a permanent suture just deep to this location and it may be trying to erode through. We have been using silver alginate on his wounds. 02/15/2022: The lateral leg wound is about half the size it was last week. It is clean with just a little perimeter eschar and light slough. The wound on his first metatarsal head is about the same with heavy callus overlying it. The medial thigh wound is closed again. He does have some skin changes on the top of his foot that looks potentially yeast related. 02/22/2022: The skin on the top of his foot improved with the use of a topical antifungal. The lateral leg wound  continues to contract and is again smaller this week. There is a little bit of slough and eschar on the surface. The first metatarsal head wound is a little bit smaller but has reaccumulated a thick callus over the top. He decided to try to trim his toenail and ultimately took the entire nail off of his left great toe. 03/02/2022: His lateral leg wound continues to improve, as does the wound on his left great toe. Unfortunately, it appears that somehow his foot got wet and moisture seeped in through the opening causing his skin to lift. There is a large wound now overlying his first metatarsal on both the plantar, medial, and dorsal portion of his foot. There is necrotic tissue and slough present underneath the shaggy macerated skin. 03/08/2022: The lateral leg wound is smaller again today. There is just a light  layer of slough and eschar on the surface. The great toe wound is smaller again today. The first metatarsal wound is a little bit smaller today and does not look nearly as necrotic and macerated. There is still slough and nonviable tissue present. 03/15/2022: The lateral leg wound is narrower and just has a little bit of light slough buildup. The first metatarsal wound still has a fair amount of moisture affecting the periwound skin. The great toe wound is healed. 03/22/2022: The lateral leg wound is now isolated to just at the level of his knee. There is some eschar and slough accumulation. The first metatarsal head wound has epithelialized tremendously and is about half the size that it was last week. He still has some maceration on the top of his foot and a fungal odor is present. 03/29/2022: T oday the patient's foot was macerated, suggesting that the cast got wet. The patient has also been picking at his dry skin and has enlarged the wound on his left lateral leg. In the time between having his cast removed and my evaluation, he had picked more dry skin and opened up additional wounds on his  Achilles area and dorsal foot. The plantar first metatarsal head wound, however, is smaller and clean with just macerated callus around the perimeter and light slough on the surface. The lateral leg wound measured a little bit larger but is also fairly clean with eschar and minimal slough. 04/02/2022: The patient had vascular studies done last Friday and so his cast was not applied. He is here today to have that done. 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 04/05/2022: Both wounds are smaller and quite clean. There is just a little biofilm on the lateral leg wound. 10/20; the patient has a wound on the left lateral surgical incision at the level of his lateral knee this looks clean and improved. He is using silver alginate. He also has an area on his left medial foot for which she is using Hydrofera Blue under a total contact cast both wounds are measuring smaller 04/20/2022: The plantar foot wound has contracted considerably and is very close to closing. The lateral leg wound was measured a little larger, but there was a tiny open area that was included in the measurements that was not included last week. He has some eschar around the perimeter but otherwise the wound looks clean. 04/27/2022: The lateral leg wound looks better this week. He says that midweek, he felt it was very dry and began applying hydrogel to the site. I think this was beneficial. The foot wound is nearly closed underneath a thick layer of dry skin and callus. 05/04/2022: The foot wound is healed. He has developed a new small ulcer on his anterior tibial surface about midway up his leg. It has a little slough on the surface. The lateral leg wound still is fairly dry, but clean with just a little biofilm on the surface. 05/11/2022: The wound on his foot reopened on Wednesday. A large blister formed which then broke open revealing the fat layer underneath. The ulcer on his anterior tibial surface is a little bit larger  this week. The lateral leg wound has much better moisture balance this week. Fortunately, prior to his foot wound reopening, he did get the cast made for his orthotic. 05/15/2022: Already, the left medial foot wound has improved. The tissue is less macerated and the surface is clean. The ulcer on his anterior tibial surface continues to enlarge. This seems  likely secondary to accumulated moisture. The lateral leg wound continues to have an improved moisture balance with the use of collagen. 05/25/2022: The medial foot wound continues to contract. It is now substantially smaller with just a little slough on the surface. The anterior tibial surface wound continues to enlarge further. Once again, this seems to be secondary to moisture. The lateral leg wound does not seem to be changing much in size, but the moisture balance is better. 06/01/2022: The anterior tibial wound is closed. The medial foot wound is down to just a very small, couple of millimeters, opening. The lateral leg wound has good moisture balance, but remains unchanged in size. 12/15; the patient's anterior tibial wound has reopened, however the area on his right first metatarsal head is closed. The major wound is actually on the superior part of his surgical wound in the left lateral thigh. Not a completely viable surface under illumination. This may at some point require a debridement I think he is currently using Prisma. As noted the left medial foot wound has closed 06/14/2022: The anterior tibial wound has closed. The lateral leg wound has a better surface but is basically unchanged in size. The left medial foot wound has reopened. It looks as though there was some callus accumulation and moisture got under the callus which caused the tissue to break down again. 06/21/2022: A new wound has opened up just distal to the previous anterior tibial wound. It is small but has hypertrophic granulation tissue present. The lateral leg wound is  a little bit narrower and has a layer of slough on the surface. The left medial foot wound is down to just a pinhole. His custom orthotics should be available next week. 06/28/2022: The wound on his first metatarsal head has healed. He has developed a new small wound on his medial lower leg, in an old scar site. The lateral leg wound continues to contract but continues to accumulate slough, as well. 07/03/2022: Despite wearing his custom orthopedic shoes, he managed to reopen the wound on his first metatarsal head. He says he thinks his foot got wet and then some skin lifted up and he peeled this away. Both of the lower leg wounds are smaller and have some dry eschar on the surface. The lateral leg wound is quite a bit narrower today. 07/12/2022: The medial lower leg wound is closed. The anterior lower leg wound has contracted considerably. The lateral upper leg wound is narrower with a layer of slough on the surface. The first metatarsal head wound is also smaller, but had copious drainage which saturated the foam border dressing and resulted in some periwound tissue maceration. Fortunately there was no breakdown at this site. 07/19/2022: The lower leg shows signs of significant maceration; I think he must be sweating excessively inside his cast. There are several areas of skin breakdown present. The wound on his foot is smaller and that on his lateral leg is narrower and is shorter by about a centimeter. 07/26/2022: Last week we used a zinc Coflex wrap prior to applying his total contact cast and this has had the effect of keeping his skin from getting macerated this week. The anterior leg wound has epithelialized substantially. The lateral leg wound is significantly smaller with just a bit of slough on the surface. The first metatarsal head wound is also smaller this week. [name], [name] ([id number]) [id number] Page 9 of 25 08/02/2022: The anterior leg wound was closed on  arrival, but while he was sitting  in the room, he picked it open again. The lateral leg wound is smaller with just a little slough on the surface and the first metatarsal head wound has contracted further, as well. 08/09/2022: The first metatarsal head wound is covered with callus. Underneath the callus, it is nearly completely closed. The lateral leg wound is smaller again this week. The anterior leg wound looks better, but he has such heavy buildup of old skin, that moisture is getting underneath this, becoming trapped, and causing the underlying skin to get macerated and open up. 08/16/2022: The first metatarsal head wound is closed. The lateral leg wound continues to contract and is quite a bit smaller again this week. There is just a small, superficial opening remaining on his anterior tibial surface. 08/23/2022: The first metatarsal head wound has, by some miracle, remained closed. The lateral leg wound is substantially smaller with multiple areas of epithelialization. The anterior tibial surface wound is also quite a bit smaller and very clean. 08/30/2022: Unfortunately, his first metatarsal head wound opened up again. It happened in the same fashion as it has on prior occasions. Moisture got under dried skin/callus and created a wound when he removed his sock, taking the skin with it. The anterior tibial surface has a thick shell of hyperkeratotic skin. This has been contributing to ongoing repeat wounding events as moisture gets underneath this and causes tissue breakdown. 3/15; patient presents for follow-up. His anterior left leg wound has healed. He still has the wound to the left lateral aspect and left first met head. We have been using silver alginate and endoform to these areas under [employer]. He has no issues or complaints today. He has been taking Augmentin and reports improvement to his symptoms to the left first met head. 09/13/2022: He has accumulated more thick dry skin in sheets on  his lower leg. The lateral leg wound is about the same size and the left first metatarsal head wound is a little bit smaller. There is slough on both surfaces. There is callus buildup around the foot wound. 09/20/2022: The lateral leg wound is a little bit narrower and the left first metatarsal head wound also seems to have contracted slightly. There is slough on both surfaces. He has a little skin breakdown on his anterior tibial surface. 09/27/2022: The lateral leg wound continues to contract and is quite clean. The first metatarsal head wound is also smaller. There is some perimeter callus and slough accumulation on the foot. The anterior tibial surface is closed. 10/04/2022: Both of his wounds are smaller today, particularly the first metatarsal head wound. 10/18/2022: He missed his appointment last week and ended up cutting off his wrap on Saturday. The anterior tibial wound reopened. It is fairly superficial with a little bit of slough on the surface. His lateral leg wound is smaller with some slough and eschar buildup. The first metatarsal head wound is also smaller with some callus and slough accumulation. 10/25/2022: All wounds are smaller. There is slough and eschar on the lateral leg and slough and callus on the plantar foot wound. The anterior tibial wound is clean and flush with the surrounding skin. No debris accumulation here. 11/01/2022: The wounds are all smaller again this week. There is slough on the lateral leg and some minimal slough and eschar on the anterior tibial wound. There is callus accumulation on the first metatarsal head site, along with slough. There is also a yeasty odor coming from the foot. 11/08/2022: The lateral leg wound is  smaller except where he picked some skin while waiting to be seen. There is a little bit of slough on the surface. The anterior tibial wound is closed. The first metatarsal head site has gotten macerated once again and has a lot of spongy wet tissue and  callus around it. No yeast odor today. 11/15/2022: The lateral leg wound continues to contract. There is now a band of epithelium dividing it into 2 areas. Minimal slough accumulation. He managed to pick open a new wound on his medial lower leg. The first metatarsal head site is smaller with some callus accumulation but no tissue maceration. 11/22/2022: The lateral leg wound is smaller again by about a third today. There is a little bit of slough on the surface with some periwound eschar. The new wound that he picked open last week has healed but he picked open 3 new small wounds on his anterior tibial surface, once again due to his picking at his skin. The first metatarsal head wound looks about the same. There has been some callus accumulation and there is more edema present, as he was not put in a compression wrap last week. 11/29/2022: The lateral leg wound is smaller again today. There is a little periwound eschar and some slough present. The wounds on his anterior tibial surface are all closed except for 1 that has a little bit of slough on the open portion with eschar covering it. The first metatarsal head wound measured a little bit smaller today, but mostly looks about the same. Edema control is better. 12/06/2022: The anterior tibial wound is closed. The lateral leg wound is smaller with some slough and eschar accumulation. The plantar foot ulcer is about the same size, but the tissue does show some evidence of the fact that he was on his feet quite a bit more this past week; there was a death in his family. There has been more callus accumulation and the tissues are little bit more purpleish. 12/13/2022: He picked the skin on his anterior tibia and reopened a wound there while he was waiting to be seen in clinic. The lateral leg wound is much smaller with a little bit of slough and eschar. The plantar foot ulcer is smaller today with callus and slough buildup, but without the pressure induced  tissue injury that was seen last week. 12/20/2022: The anterior tibial wound has healed. The lateral leg wound is smaller again this week. There is a little bit of eschar buildup on the surface. The foot had a fairly strong odor coming from it that persisted even after washing. The wound itself looks like its gotten a little smaller but he has built up thick callus, once again. 12/26/2022: The lateral leg wound is down to just a narrow superficial slit. There is slough and a little bit of dry skin present. The foot is in much better shape today. There is less callus accumulation and no odor. The skin edges are starting to roll inward, however. 01/03/2023: The lateral leg wound is healed. The skin is quite dry but the wound has closed. The foot continues to contract. He has a fair amount of periwound callus accumulation and the surface of the is a little drier than ideal. 01/10/2023: The large lateral leg wound remains closed. He managed to pick off some of his dry skin and has multiple small open superficial wounds on his lower leg. The foot wound measures smaller, but he has a blister immediately adjacent to it. 01/17/2023: The large lateral  leg wound remains closed. The small superficial wounds on his lower leg have also closed. The foot wound is about the same and the blister area immediately adjacent to it has dark eschar on the surface. The foot wound looks a bit dry. 01/24/2023: He picked at some dry skin on his lateral leg wound and reopened. The surface is dry and fibrotic. The foot wound is slightly smaller but has periwound callus and the edges are rolling inward again. 01/31/2023: His lateral leg wound is larger again today. It appears that the Ace bandage may be rubbing and causing some tissue breakdown. His foot wound is also a bit larger and the tissue does not appear particularly healthy. 02/07/2023: The lateral leg wound improved quite significantly over the last week. He has been applying  additional gauze over the site before applying the Ace bandage and this seems to have minimized the rubbing. The foot wound is about the same. The culture that I took last week returned with a polymicrobial population of predominantly skin flora. He is currently taking Augmentin as recommended by the culture data. DANNI, MEES ([id number]) [id number] Page 10 of 25 02/14/2023: The lateral leg wound is covered in a layer of eschar. Underneath, the wound is nearly healed again. The foot wound looks better today. The depth around the edges has decreased. The tissue has a healthier appearance. He is still taking Augmentin. 02/21/2023: The lateral leg wound is smaller with a layer of eschar on the surface, but is not quite healed. The foot wound is stable but the depth around the edges is almost completely obliterated. There is some callus and senescent skin around the edges. 03/07/2023: The lateral leg wound is down to just a pinhole with a little bit of eschar on the surface. The foot wound demonstrates evidence that he has been walking excessively the past week and there is more callus with moisture underneath it, but the breakdown is limited to the callus layer and there is still intact skin underneath. 03/14/2023: The lateral leg wound is closed. The foot wound looks much improved this week. There is very minimal callus accumulation around the wound edges and the tissue surface appears healthier. It seems the adjustments made to his shoe by the [facility] were effective. 03/28/2023: The lateral leg wound has reopened. The patient reports that he was not moisturizing it as much as he probably should have been and it cracked and some skin peeled away. The foot wound shows signs of pressure-related trauma with some bruising and increased callus. The patient admits to extensive walking this past week while taking his wife to medical appointments. He does have a knee scooter and  it is unclear why he does not use it. 04/04/2023: The lateral leg wound is larger again today. The patient reports that he thinks maybe some dry skin pulled off when he was changing his dressing. It is quite dry and he states that he is using just water to moisten his endoform, rather than the hydrogel that was recommended. The foot wound is the same size, but the tissue is in better condition, without substantial bruising or friction-related trauma. 04/11/2023: Both wounds look better this week. Much of the lateral leg wound has epithelialized. The moisture balance is much better. The foot wound looks better than it has in months. The granulation tissue is flush with the surrounding skin and there is not much callus accumulation. 04/25/2023: The patient was out of town last week. The lateral leg ulcer has  good moisture balance, but has opened up a little bit more at the most proximal aspect. The foot ulcer has a layer of callus around the outside but is otherwise fairly clean with minimal slough buildup. 05/02/2023: He has 2 new wounds today. He has picked at the skin on his left anterior tibial surface and opened up the wound location. He picked a callus on his medial foot adjacent to his existing metatarsal head wound and opened up the site there. The lateral leg wound looks a little bit better this week and the moisture balance is excellent. The metatarsal head wound has a little bit of breakdown at the more proximal end but otherwise is also making improvement. 05/09/2023: Both foot ulcers are smaller and look better today. The lower leg ulcer is also smaller. The lateral leg ulcer is basically the same size. All of the wounds have slough on the surface and there is some callus accumulation around the plantar first metatarsal head wound. 05/16/2023: The smaller of the foot ulcers is nearly healed. The plantar foot ulcer is stable. The lower leg ulcer is smaller. The lateral leg ulcer is a little  bit smaller and the moisture balance is good. There is slough on all of her wound surfaces. Electronic Signature(s) Signed: 05/16/2023 10:28:12 AM By: [name] MD FACS Entered By: [name] on 05/16/2023 10:28:12 -------------------------------------------------------------------------------- Physical Exam Details Patient Name: Date of Service: [name] Grapes. 05/16/2023 10:00 A M Medical Record Number: [id number] Patient Account Number: [id number] Date of Birth/Sex: Treating RN: Mar 28, 1951 (72 y.o. M) Primary Care Provider: [name] Other Clinician: Referring Provider: Treating Provider/Extender: [name] in Treatment: 109 Constitutional Hypertensive, asymptomatic. . . . no acute distress. Respiratory Normal work of breathing on room air.. Notes 05/16/2023: The smaller of the foot ulcers is nearly healed. The plantar foot ulcer is stable. The lower leg ulcer is smaller. The lateral leg ulcer is a little bit smaller and the moisture balance is good. There is slough on all of the wound surfaces. Electronic Signature(s) Signed: 05/16/2023 10:28:50 AM By: [name] MD FACS Entered By: [name] on 05/16/2023 10:28:50 [name] ([id number]) [id number] Page 11 of 25 -------------------------------------------------------------------------------- Physician Orders Details Patient Name: Date of Service: [name], [name] 05/16/2023 10:00 A M Medical Record Number: [id number] Patient Account Number: [id number] Date of Birth/Sex: Treating RN: 29-May-1951 (72 y.o. 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 - Dr. Lady Gary - room 2 Anesthetic (In clinic) Topical Lidocaine 4% applied to wound bed Bathing/ Shower/ Hygiene May shower and wash wound with soap and water. Off-Loading Wound #18R Left,Plantar Metatarsal head first Open toe surgical shoe to: - Front off loader Left ft Other: - minimal weight bearing left foot Additional Orders / Instructions Follow Nutritious Diet - vitamin C  500 mg 3 times a day and zinc 30-50 mg a day Wound Treatment Wound #18R - Metatarsal head first Wound Laterality: Plantar, Left Cleanser: Soap and Water 1 x Per Day/30 Days Discharge Instructions: May shower and wash wound with dial antibacterial soap and water prior to dressing change. Cleanser: Wound Cleanser 1 x Per Day/30 Days Discharge Instructions: Cleanse the wound with wound cleanser prior to applying a clean dressing using gauze sponges, not tissue or cotton balls. Prim Dressing: Endoform 2x2 in 1 x Per Day/30 Days ary Discharge Instructions: Moisten with saline Secondary Dressing: Optifoam Non-Adhesive Dressing, 4x4 in (Generic) 1 x Per Day/30 Days Discharge Instructions: Apply over primary dressing as directed. Secondary Dressing: Woven Gauze Sponges 2x2 in (Generic) 1 x Per Day/30 Days Discharge Instructions: Apply over primary dressing as directed. Secured With: 847M Medipore H Soft Cloth Surgical T ape,  4 x 10 (in/yd) 1 x Per Day/30 Days Discharge Instructions: Secure with tape as directed. Wound #22R - Lower Leg Wound Laterality: Left, Lateral, Proximal Cleanser: Soap and Water 1 x Per Day/30 Days Discharge Instructions: May shower and wash wound with dial antibacterial soap and water prior to dressing change. Cleanser: Wound Cleanser 1 x Per Day/30 Days Discharge Instructions: Cleanse the wound with wound cleanser prior to applying a clean dressing using gauze sponges, not tissue or cotton balls. Prim Dressing: Maxorb Extra Ag+ Alginate Dressing, 2x2 (in/in) 1 x Per Day/30 Days ary Discharge Instructions: Apply to wound bed as instructed Secondary Dressing: Woven Gauze Sponge, Non-Sterile 4x4 in 1 x Per Day/30 Days Discharge Instructions: Apply over primary dressing as directed. [name], [name] ([id number]) [id number] Page 12 of 25 Secured With: 847M Medipore H Soft Cloth Surgical T ape, 4 x 10 (in/yd) 1 x Per Day/30 Days Discharge Instructions: Secure with tape as directed. Wound #36 - Lower Leg Wound Laterality: Left, Anterior Cleanser: Soap and Water 1 x Per Day/30 Days Discharge Instructions: May shower and wash wound with dial antibacterial soap and water prior to dressing change. Cleanser: Wound Cleanser 1 x Per Day/30 Days Discharge Instructions: Cleanse the wound with wound cleanser prior to applying a clean dressing using gauze sponges, not tissue or cotton balls. Prim Dressing: Maxorb Extra Ag+ Alginate Dressing, 2x2 (in/in) 1 x Per Day/30 Days ary Discharge Instructions: Apply to wound bed as instructed Secondary Dressing: Zetuvit Plus Silicone Border Dressing 3x3 (in/in) 1 x Per Day/30 Days Discharge Instructions: Apply silicone border over primary dressing as directed. Wound #37 - Foot Wound Laterality: Left, Medial Cleanser: Soap and Water 1 x Per Day/30 Days Discharge Instructions: May shower and wash wound with dial antibacterial soap and water  prior to dressing change. Cleanser: Wound Cleanser 1 x Per Day/30 Days Discharge Instructions: Cleanse the wound with wound cleanser prior to applying a clean dressing using gauze sponges, not tissue or cotton balls. Prim Dressing: Endoform 2x2 in 1 x Per Day/30 Days ary Discharge Instructions: Moisten with saline Secondary Dressing: Optifoam Non-Adhesive Dressing, 4x4 in (Generic) 1 x Per Day/30 Days Discharge Instructions: Apply over primary dressing as directed. Secondary Dressing: Woven Gauze Sponges 2x2 in (Generic) 1 x Per Day/30 Days Discharge Instructions: Apply over primary dressing as directed. Secured With: 847M Medipore H Soft Cloth Surgical T ape, 4 x 10 (in/yd) 1 x Per Day/30 Days Discharge Instructions: Secure with tape as directed. Patient Medications llergies: No Known Drug Allergies A Notifications Medication Indication Start End 05/16/2023 lidocaine DOSE topical 4 % cream - cream topical Electronic Signature(s) Signed: 05/16/2023 11:02:44 AM  By: [name] MD FACS Entered By: [name] on 05/16/2023 10:29:07 -------------------------------------------------------------------------------- Problem List Details Patient Name: Date of Service: [name] Grapes. 05/16/2023 10:00 A M Medical Record Number: [id number] Patient Account Number: [id number] Date of Birth/Sex: Treating RN: 1950-10-03 (72 y.o. 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  03/28/2023 No Yes L97.828 Non-pressure chronic ulcer of other part of left lower leg with other specified 05/02/2023 No Yes severity I87.322 Chronic venous hypertension (idiopathic) with inflammation of left lower 04/12/2021 No Yes extremity L85.9 Epidermal thickening, unspecified 08/30/2022 No Yes E11.51 Type 2 diabetes mellitus with diabetic peripheral angiopathy without gangrene 04/12/2021 No Yes Inactive Problems ICD-10 Code Description Active Date Inactive Date E11.621 Type 2 diabetes mellitus with foot ulcer 04/12/2021 04/12/2021 E11.42 Type 2 diabetes mellitus with diabetic polyneuropathy 04/12/2021 04/12/2021 L02.416 Cutaneous abscess of left lower limb 06/13/2021 06/13/2021 Resolved Problems ICD-10 Code Description Active Date Resolved Date L97.828 Non-pressure chronic ulcer of other part of left lower leg with other specified severity 01/10/2023 06/08/2022 Electronic Signature(s) Signed: 05/16/2023 10:26:18 AM By: [name] MD FACS Entered By: [name] on 05/16/2023 10:26:18 -------------------------------------------------------------------------------- Progress Note Details Patient Name: Date of Service: [name] Grapes. 05/16/2023 10:00 A M Medical Record Number: [id number] Patient Account Number: [id number] Date of Birth/Sex: Treating RN: [birth year] (72 y.o. M) Primary Care Provider: [name] Other Clinician: Referring Provider: Treating Provider/Extender: [name] in Treatment: [address], Dola Factor ([id number]) [id number] Page 14 of 25 Chief Complaint Information obtained from Patient Left leg and foot ulcers 04/12/2021; patient is here for wounds on his left lower leg and left plantar foot over the first metatarsal head History of Present Illness (HPI) 10/11/17; Mr. Coulibaly is a 72 year old man who tells me that in 2015 he slipped down the latter traumatizing his left leg. He developed a  wound in the same spot the area that we are currently looking at. He states this closed over for the most part although he always felt it was somewhat unstable. In 2016 he hit the same area with the door of his car had this reopened. He tells me that this is never really closed although sometimes an inflow it remains open on a constant basis. He has not been using any specific dressing to this except for topical antibiotics the nature of which were not really sure. His primary doctor did send him to see Dr. Jacinto Halim of interventional cardiology. He underwent an angiogram on 08/06/17 and he underwent a PTA and directional atherectomy of the lesser distal SFA and popliteal arteries which resulted in brisk improvement in blood flow. It was noted that he had 2 vessel runoff through the anterior tibial and peroneal. He is also been to see vascular and interventional radiologist. He was not felt to have any significant superficial venous insufficiency. Presumably is not a candidate for any ablation. It was suggested he come here for wound care. The patient is a type II diabetic on insulin. He also has a history of venous insufficiency. ABIs on the left were noncompressible in our clinic 10/21/17; patient we admitted to the  clinic last week. 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 We put Iodosorb on him after an aggressive debridement and 3 layer compression. He complained of pain in his ankle and itching with is skin in fact he scratched the area on the medial calf superiorly at the rim of our wraps and he has 2 small open areas in that location today which are new. I changed his primary dressing today to silver collagen. As noted he is already had revascularization and does not have any significant superficial venous insufficiency that would be amenable to ablation 10/28/17; patient admitted to the clinic 2 weeks ago. He has a smaller Wound.  Scratch injury from last week revealed. There is large wound over the tibial area. This is smaller. Granulation looks healthy. No need for debridement. 11/04/17; the wound on the left lateral calf looks better. Improved dimensions. Surface of this looks better. We've been maintaining him and Kerlix Coban wraps. He finds this much more comfortable. Silver collagen dressing 11/11/17; left lateral Wound continues to look healthy be making progress. Using a #5 curet I removed removed nonviable skin from the surface of the wound and then necrotic debris from the wound surface. Surface of the wound continues to look healthy. He also has an open area on the left great toenail bed. We've been using topical antibiotics. 11/19/17; left anterior lateral wound continues to look healthy but it's not closed. He also had a small wound above this on the left leg Initially traumatic wounds in the setting of significant chronic venous insufficiency and stasis dermatitis 11/25/17; left anterior wounds superiorly is closed still a small wound inferiorly. 12/02/17; left anterior tibial area. Arrives today with adherent callus. Post debridement clearly not completely closed. Hydrofera Blue under 3 layer compression. 12/09/17; left anterior tibia. Circumferential eschar however the wound bed looks stable to improved. We've been using Hydrofera Blue under 3 layer compression 12/17/17; left anterior tibia. Apparently this was felt to be closed however when the wrap was taken off there is a skin tear to reopen wounds in the same area we've been using Hydrofera Blue under 3 layer compression 12/23/17 left anterior tibia. Not close to close this week apparently the [facility] was stuck to this again. Still circumferential eschar requiring debridement. I put a contact layer on this this time under the Hydrofera Blue 12/31/17; left anterior tibia. Wound is better slight amount of hyper-granulation. Using Hydrofera Blue over  Adaptic. 01/07/18; left anterior tibia. The wound had some surface eschar however after this was removed he has no open wound.he was already revascularized by Dr. Jacinto Halim when he came to our clinic with atherectomy of the left SFA and popliteal artery. He was also sent to interventional radiology for venous reflux studies. He was not felt to have significant reflux but certainly has chronic venous changes of his skin with hemosiderin deposition around this area. He will definitely need to lubricate his skin and wear compression stocking and I've talked to him about this. READMISSION 05/26/2018 This is a now 72 year old man we cared for with traumatic wounds on his left anterior lower extremity. He had been previously revascularized during that admission by Dr. Jacinto Halim. Apparently in follow-up Dr. Jacinto Halim noted that he had deterioration in his arterial status. He underwent a stent placement in the distal left SFA on 04/22/2018. 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 He went back to the angiography suite on 04/30/2018 he underwent PTA  and balloon angioplasty of the occluded left mid anterior tibial artery, thrombotic occlusion went from 100 to 0% which reconstitutes the posterior tibial artery. He had thrombectomy and aspiration of the peroneal artery. The stent placed in the distal SFA left SFA was still occluded. 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 He has been recuperating at [facility] in [facility] after the most recent hospitalization. He is going home tomorrow. He tells me that 2 weeks ago he traumatized the tip of his left fifth toe. He came in urgently for our review of this. This was a history of before I noted that Dr.  Jacinto Halim had already noted dry gangrenous changes of the left fifth toe 06/09/2018; 2-week follow-up. I did contact Dr. Jacinto Halim after his last appointment and he apparently saw 1 of Dr. Verl Dicker colleagues the next day. He does not follow-up with Dr. Jacinto Halim himself until Thursday of this week. 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 Nevertheless there is no evidence of infection no drainage and no pain. He had a new area that this week when we were signing him in today on the left anterior mid tibia area, this is in close proximity to the previous wound we have dealt with in this clinic. 06/23/2018; 2-week follow-up. I did not receive a recent note from Dr. Jacinto Halim to review today. Our office is trying to obtain this. He is apparently not planning to do further vascular interventions and wondered about compression to try and help with the patient's chronic venous insufficiency. However we are also concerned about the arterial flow. He arrives in clinic today with a new area on the left third toe. The areas on the calf/anterior tibia are close to closing. The left fifth toe is still mummified using Betadine. -In reviewing things with the patient he has what sounds like claudication with mild to moderate amount of activity. 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 I prescribed Levaquin over the phone while we attempted to arrange a plan of care. However the patient called yesterday to report he had low-grade fever and he came in today acutely. There is been a marked deterioration in the left third toe with spreading cellulitis up into the dorsal left foot. He was referred to the emergency room. Readmission: 06/29/2020 patient presents today for reevaluation here in our clinic he was previously treated by Dr. Leanord Hawking at the latter part of 2019 in 2 the beginning of 2020. Subsequently we have not seen him since that time in the interim he did have evaluation  with vein and vascular specialist specifically Dr. Bo Mcclintock who did perform quite extensive work for a left femoral to anterior tibial artery bypass. With that being said in the interim the patient has developed significant lymphedema and has wounds that he tells me have really never healed in regard to the incision site on the left leg. He also has multiple wounds on the feet for various reasons some of which is that he tends to pick at his feet. Fortunately there is no signs of active infection systemically at this time he does have some wounds that are little bit deeper but most are fairly  superficial he seems to have good blood flow and overall everything appears to be healthy I see no bone exposed and no obvious signs of osteomyelitis. I do not know that he necessarily needs a x-ray at this point although that something we could consider depending on how things progress. The patient does have a history of lymphedema, diabetes, this is type II, chronic kidney disease stage III, hypertension, and history of peripheral vascular disease. 07/05/2020; patient admitted last week. Is a patient I remember from 2019 he had a spreading infection involving the left foot and we sent him to the hospital. 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 He subsequently had a left femoral to anterior tibial bypass by Dr.Cain vein KEARNEY, WOODRIDGE ([id number]) [id number].pdf Page 15 of 25 and vascular. He also has severe lymphedema with chronic skin changes related to that on the left leg. The most problematic area that was new today was on the left medial great toe. This was apparently a small area last week there was purulent drainage which our intake nurse cultured. Also areas on the left medial foot and heel left lateral foot. He has 2 areas on the left medial calf left lateral calf in the setting of the severe lymphedema. 07/13/2020 on evaluation today  patient appears to be doing better in my opinion compared to his last visit. The good news is there is no signs of active infection systemically and locally I do not see any signs of infection either. He did have an x-ray which was negative that is great news he had a culture which showed MRSA but at the same time he is been on the doxycycline which has helped. I do think we may want to extend this for 7 additional days 1/25; patient admitted to the clinic a few weeks ago. 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 We have been putting him under compression his edema control is a lot better but he is severe verricused skin on the left leg. He is really done quite well he still has an open area on the left medial calf and the left medial first metatarsal head. We have been using silver collagen on the leg silver alginate on the foot 07/27/2020 upon evaluation today patient appears to be doing decently well in regard to his wounds. He still has a lot of dry skin on the left leg. Some of this is starting to peel back and I think he may be able to have them out by removing some that today. Fortunately there is no signs of active infection at this time on the left leg although on the right leg he does appear to have swelling and erythema as well as some mild warmth to touch. This does have been concerned about the possibility of cellulitis although within the differential diagnosis I do think that potentially a DVT has to be at least considered. We need to rule that out before proceeding would just call in the cellulitis. Especially since he is having pain in the posterior aspect of his calf muscle. 2/8; the patient had seen sparingly. He has severe skin changes of chronic lymphedema in the left leg thickened hyperkeratotic verrucous skin. He has an open wound on the medial part of the left first met head left mid tibia. He also has a rim of nonepithelialized skin in  the anterior mid tibia. He brought in the AmLactin lotion  that was been prescribed although I am not sure under compression and its utility. There concern about cellulitis on the right lower leg the last time he was here. He was put on on antibiotics. His DVT rule out was negative. The right leg looks fine he is using his stocking on this area 08/10/2020 upon evaluation today patient appears to be doing well with regard to his leg currently. He has been tolerating the dressing changes without complication. Fortunately there is no signs of active infection which is great news. Overall very pleased with where things stand. 2/22; the patient still has an area on the medial part of the left first met his head. This looks better than when I last saw this earlier this month he has a rim of epithelialization but still some surface debris. Mostly everything on the left leg is healed. There is still a vulnerable in the left mid tibia area. 08/30/2020 upon evaluation today patient appears to be doing much better in regard to his wounds on his foot. 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 Nonetheless I think we will address that today I Minna send in a prescription for him in that regard. Overall though there does not appear to be any signs of significant worsening. 09/07/2020 on evaluation today patient's wounds over his left foot appear to be doing excellent. I do not see any signs of infection there is some callus buildup this can require debridement for certain but overall I feel like he is managing quite nicely. He still using the AmLactin cream which has been beneficial for him as well. 3/22; left foot wound is closed. There is no open area here. He is using ammonium lactate lotion to the lower extremities to help exfoliate dry cracked skin. He has compression stockings  from elastic therapy in [place]. The wound on the medial part of his left first met head is healed today. READMISSION 04/12/2021 Mr. Feehan is a patient we know fairly well he had a prolonged stay in clinic in 2019 with wounds on his left lateral and left anterior lower extremity in the setting of chronic venous insufficiency. More recently he was here earlier this year with predominantly an area on his left foot first metatarsal head plantar and he says the plantar foot broke down on its not long after we discharged him but he did not come back here. The last few months areas of broken down on his left anterior and again the left lateral lower extremity. The leg itself is very swollen chronically enlarged a lot of hyperkeratotic dry Berry Q skin in the left lower leg. His edema extends well into the thigh. He was seen by Dr. Randie Heinz. He had ABIs on 03/02/2021 showing an ABI on the right of 1 with a TBI of 0.72 his ABI in the left at 1.09 TBI of 0.99. Monophasic and biphasic waveforms on the right. On the left monophasic waveforms were noted he went on to have an angiogram on 03/27/2021 this showed the aortic aortic and iliac segments were free of flow-limiting stenosis the left common femoral vein to evaluate the left femoral to anterior tibial artery bypass was unobstructed the bypass was patent without any areas of stenosis. We discharged the patient in bilateral juxta lite stockings but very clearly that was not sufficient to control the swelling and maintain skin integrity. He  is clearly going to need compression pumps. 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 This is fortunate because he is on his feet for long periods of time. 10/27; patient comes in with our intake nurse reporting copious amount of green drainage from the left anterior mid tibia the left dorsal foot and to a lesser extent the left medial mid tibia. We left the compression wrap  on all week for the amount of edema in his left leg is quite a bit better. We use silver alginate as the primary dressing 11/3; edema control is good. Left anterior lower leg left medial lower leg and the plantar first metatarsal head. The left anterior lower leg required debridement. Deep tissue culture I did of this wound showed MRSA I put him on 10 days of doxycycline which she will start today. We have him in compression wraps. He has a security card and [employer] however he is retiring on November 15. We will need to then get him into a better offloading boot for the left foot perhaps a total contact cast 11/10; edema control is quite good. Left anterior and left medial lower leg wounds in the setting of chronic venous insufficiency and lymphedema. He also has a substantial area over the left plantar first metatarsal head. 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 He has significant hypergranulation on the left plantar foot wound. The patient is a diabetic but he does not have significant PAD 11/17; edema control is quite good. Left anterior and left medial lower leg wounds look better. The really concerning area remains the area on the left plantar first metatarsal head. He has a rim of epithelialization. 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 Starting today with a forefoot off loader but . possibly a total contact cast. 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 Arrives with a very significant odor from I think all of the wounds on the left leg and the left foot. Because of this I did not put a total contact cast on him today but will could still consider this. His wife was having  cataract surgery which is the reason he missed the appointment 12/6. I saw this man 5 days ago with a swelling below the popliteal fossa. I thought he actually might have a Baker's cyst however the DVT rule out study that we could arrange right away was negative the technician told me this was not a ruptured Baker's cyst. We attempted to get this aspirated by under ultrasound guidance in interventional radiology however all they did was an ultrasound however it shows an extensive fluid collection 62 x 8 x 9.4 in the left thigh and left calf. The patient states he thinks this started 8 days ago or so but he really is not complaining of any pain, fever or systemic symptoms. He has not ha 12/20; after some difficulty I managed to get the patient into see Dr. Randie Heinz. Eventually he was taken into the hospital and had a drain put in the fluid collection below his left knee posteriorly extending into  the posterior thigh. He still has the drain in place. Culture of this showed moderate staff aureus few Morganella and few Klebsiella he is now on doxycycline and ciprofloxacin as suggested by infectious disease he is on this for a month. The drain will remain in place until it stops draining 12/29; he comes in today with the 1 wound on his left leg and the area on the left plantar first met head significantly smaller. Both look healthy. MACSEN, FEIERTAG ([id number]) [id number] Page 16 of 25 He still has the drain in the left leg. He says he has to change this daily. Follows up with Dr. Randie Heinz on January 11. 06/29/2021; the wounds that I am following on the left leg and left first met head continued to be quite healthy. However the area where his inferior drain is in place had copious amounts of drainage which was green in color. The wound here is larger. Follows up with Dr. Pascal Lux of vein and vascular his surgeon next week as well as infectious disease. He remains on ciprofloxacin and  doxycycline. He is not complaining of excessive pain in either one of the drain areas 1/12; the patient saw vascular surgery and infectious disease. Vascular surgery has left the drain in place as there was still some notable drainage still see him back in 2 weeks. Dr. Dorthula Perfect stop the doxycycline and ciprofloxacin and I do not believe he follows up with them at this point. Culture I did last week showed both doxycycline resistant MRSA and Pseudomonas not sensitive to ciprofloxacin although only in rare titers 1/19; the patient's wound on the left anterior lower leg is just about healed. We have continued healing of the area that was medially on the left leg. Left first plantar metatarsal head continues to get smaller. The major problem here is his 2 drain sites 1 on the left upper calf and lateral thigh. There is purulent drainage still from the left lateral thigh. I gave him antibiotics last week but we still have recultured. He has the drain in the area I think this is eventually going to have to come out. I suspect there will be a connecting wound to heal here perhaps with improved VAc 1/26; the patient had his drain removed by vein and vascular on 1/25/. This was a large pocket of fluid in his left thigh that seem to tunnel into his left upper calf. 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 His mention his Penrose drain was removed today. He now has a tunneling wound on his left calf and left thigh. Both of these probe widely towards each other although I cannot really prove that they connect. Both wounds on his lower leg anteriorly are closed and his area over the first metatarsal head on his right foot continues to improve. We are using Hydrofera Blue here. He also saw infectious disease culture of the abscess they noted was polymicrobial with MRSA, Morganella and Klebsiella he was treated with doxycycline and ciprofloxacin for 4 weeks ending on 07/03/2021. They did not recommend  any further antibiotics. Notable that while he still had the Penrose drain in place last week he had purulent drainage coming out of the inferior IandD site this grew [facility], MRSA and Pseudomonas but there does not appear to be any active infection in this area today with the drain out and he is not systemically unwell 2/2; with regards to the drain sites the superior one on the thigh actually is closed  down the one on the upper left lateral calf measures about 8 and half centimeters which is an improvement seems to be less prominent although still with a lot of drainage. The only remaining wound is over the first metatarsal head on the left foot and this looks to be continuing to improve with Hydrofera Blue. 2/9; the area on his plantar left foot continues to contract. Callus around the wound edge. The drain sites specifically have not come down in depth. We put the wound VAC on Monday he changed the canister late last night our intake nurse reported a pocket of fluid perhaps caused by our compression wraps 2/16; continued improvement in left foot plantar wound. drainage site in the calf is not improved in terms of depth (wound vac) 2/23; continued improvement in the left foot wound over the first metatarsal head. With regards to the drain sites the area on his thigh laterally is healed however the open area on his calf is small in terms of circumference by still probes in by about 15 cm. Within using the wound VAC. Hydrofera Blue on his foot 08/24/2021: The left first metatarsal head wound continues to improve. The wound bed is healthy with just some surrounding callus. Unfortunately the open drain site on his calf remains open and tunnels at least 15 cm (the extent of a Q-tip). This is despite several weeks of wound VAC treatment. Based on reading back through the notes, there has been really no significant change in the depth of the wound, although the orifice is smaller and the more cranial  wound on his thigh has closed. I suspect the tunnel tracks nearly all the way to this location. 08/31/2021: Continued improvement in the left first metatarsal head wound. There has been absolutely no improvement to the long tunnel from his open drain site on his calf. We have tried to get him into see vascular surgery sooner to consider the possibility of simply filleting the tract open and allowing it to heal from the bottom up, likely with a wound VAC. They have not yet scheduled a sooner appointment than his current mid April 09/14/2021: He was seen by vascular surgery and they took him to the operating room last week. They opened a portion of the tunnel, but did not extend the entire length of the known open subcutaneous tract. I read Dr. Darcella Cheshire operative note and it is not clear from that documentation why only a portion of the tract was opened. The heaped up granulation tissue was curetted and removed from at least some portion of the tract. They did place a wound VAC and applied an Unna boot to the leg. The ulcer on his left first metatarsal head is smaller today. The bed looks good and there is just a small amount of surrounding callus. 09/21/2021: The ulcer on his left first metatarsal head looks to be stalled. There is some callus surrounding the wound but the wound bed itself does not appear particularly dynamic. The tunnel tract on his lateral left leg seems to be roughly the same length or perhaps slightly smaller but the wound bed appears healthy with good granulation tissue. He opened up a new wound on his medial thigh and the site of a prior surgical incision. He says that he did this unconsciously in his sleep by scratching. 09/28/2021: Unfortunately, the ulcer on his left first metatarsal head has extended underneath the callus toward the dorsum of his foot. The medial thigh wounds are roughly the same. The tunnel  on his lateral left leg continues to be problematic; it is longer than we are  able to actually probe with a Q-tip. I am still not certain as to why Dr. Randie Heinz did not open this up entirely when he took the patient to the operating room. We will likely be back in the same situation with just a small superficial opening in a long unhealed tract, as the open portion is granulating in nicely. 10/02/2021: The patient was initially scheduled for a nurse visit, but we are also applying a total contact cast today. The plantar foot wound looks clean without significant accumulated callus. We have been applying Prisma silver collagen to the site. 10/05/2021: The patient is here for his first total contact cast change. We have tried using gauze packing strips in the tunnel on his lateral leg wound, but this does not seem to be working any better than the white VAC foam. The foot ulcer looks about the same with minimal periwound callus. Medial thigh wound is clean with just some overlying eschar. 10/12/2021: The plantar foot wound is stable without any significant accumulation of periwound callus. The surface is viable with good granulation tissue. The medial thigh wounds are much smaller and are epithelializing. On the other hand, he had purulent drainage coming from the tunnel on his lateral leg. He does go back to see Dr. Randie Heinz next week and is planning to ask him why the wound tunnel was not completely opened at the time of his most recent operation. 10/19/2021: The plantar foot wound is markedly improved and has epithelial tissue coming through the surface. The medial thigh wounds are nearly closed with just a tiny open area. He did see Dr. Randie Heinz earlier this week and apparently they did discuss the possibility of opening the sinus tract further and enabling a wound VAC application. Apparently there are some limits as to what Dr. 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 I think if we could get the tract open to the level of the popliteal fossa, this would  greatly aid in her ability to get this chart closed. That being said, however, today when I probed the tract with a Q-tip, I was not able to insert the entirety of the Q-tip as I have on previous occasions. The tunnel is shorter by about 4 cm. The surface is clean with good granulation tissue and no further episodes of purulent drainage. 10/30/2021: Last week, the patient underwent surgery and had the long tract in his leg opened. There was a rind that was debrided, according to the operative report. His medial thigh ulcers are closed. The plantar foot wound is clean with a good surface and some built up surrounding callus. 11/06/2021: The overall dimensions of the large wound on his lateral leg remain about the same, but there is good granulation tissue present and the tunneling is a little bit shorter. He has a new wound on his anterior tibial surface, in the same location where he had a similar lesion in the past. The plantar foot wound is clean with some buildup surrounding callus. Just toward the medial aspect of his foot, however, there is an area of darkening that once debrided, revealed another opening in the skin surface. 11/13/2021: The anterior tibial surface wound is closed. The plantar foot wound has some surrounding callus buildup. The area of darkening that I debrided last week and revealed an opening in the skin surface has closed again. The tunnel in the large wound  on his lateral leg has come in by about 3 cm. There is [name], [name] ([id number]) [phone].pdf Page 17 of 25 healthy granulation tissue on the entire wound surface. 11/23/2021: The patient was out of town last week and did wet-to-dry dressings on his large wound. 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 Unfortunately, he picked open the wound on his left medial thigh. He says that it was itching and he just could not stop scratching  it until it was open again. The wound on his plantar foot is smaller and has not accumulated a tremendous amount of callus. The lateral leg wound is shallower and the tunnel has also decreased in depth. There is just a little bit of slough accumulation on the surface. 11/30/2021: Another portion of his left medial thigh has been opened up. All of these wounds are fairly superficial with just a little bit of slough and eschar accumulation. The wound on his plantar foot is almost closed with just a bit of eschar and periwound callus accumulation. The lateral leg wound is nearly flush with the surrounding skin and the tunnel is markedly shallower. 12/07/2021: There is just 1 open area on his left medial thigh. It is clean with just a little bit of perimeter eschar. The wound on his plantar foot continues to contract and just has some eschar and periwound callus accumulation. The lateral leg wound is closing at the more distal aspect and the tunnel is smaller. The surface is nearly flush with the surrounding skin and it has a good bed of granulation tissue. 12/14/2021: The thigh and foot wounds are closed. The lateral leg wound has closed over approximately half of its length. The tunnel continues to contract and the surface is now flush with the surrounding skin. The wound bed has robust granulation tissue. 12/22/2021: The thigh and foot wounds have reopened. The foot wound has a lot of callus accumulation around and over it. The thigh wound is tiny with just a little bit of slough in the wound bed. The lateral leg wound continues to contract. His vascular surgeon took the wound VAC off earlier in the week and the patient has been doing wet-to-dry dressings. There is a little slough accumulation on the surface. The tunnel is about 3 cm in depth at this point. 12/28/2021: The thigh wound is closed again. The foot wound has some callus that subsequently has peeled back exposing just a small slit of a wound. The  lateral leg wound Is down to about half the size that it originally was and the tunnel is down to about half a centimeter in depth. 01/04/2022: The thigh wound remains closed. The foot wound has heavy callus overlying the wound site. Once this was debrided, the wound was found to be closed. The lateral leg wound is smaller again this week and very superficial. No tunnel could be identified. 01/12/2022: The thigh and foot wounds both remain closed. The lateral leg wound is now nearly flush with the skin surface. There is good granulation tissue present with a light layer of slough. 01/19/2022: Due to the way his wrap was placed, the patient did not change the dressing on his thigh at all and so the foam was saturated and his skin is macerated. There is a light layer of slough on the wound surface. The underlying granulation tissue is robust and healthy-appearing. He has heavy callus buildup at the site of his first metatarsal  head wound which is still healed. 02/01/2022: He has been in silver alginate. When he removed the dressing from his thigh wound, however, some leg, superficially reopening a portion of the wound that had healed. In addition, underneath the callus at his left first metatarsal head, there appears to be a blister and the wound appears to be open again. 02/08/2022: The lateral leg wound has contracted substantially. There is eschar and a light layer of slough present. He says that it is starting to pull and is uncomfortable. On inspection, there is some puckering of the scar and the eschar is quite dry; this may account for his symptoms. On his first metatarsal head, the wound is much smaller with just some eschar on the surface. The callus has not reaccumulated. He reports that he had a blister come up on his medial thigh wound at the distal aspect. It popped and there is now an opening in his skin again. Looking back through his library of wound photos, there is what looks like a  permanent suture just deep to this location and it may be trying to erode through. We have been using silver alginate on his wounds. 02/15/2022: The lateral leg wound is about half the size it was last week. It is clean with just a little perimeter eschar and light slough. The wound on his first metatarsal head is about the same with heavy callus overlying it. The medial thigh wound is closed again. He does have some skin changes on the top of his foot that looks potentially yeast related. 02/22/2022: The skin on the top of his foot improved with the use of a topical antifungal. The lateral leg wound continues to contract and is again smaller this week. There is a little bit of slough and eschar on the surface. The first metatarsal head wound is a little bit smaller but has reaccumulated a thick callus over the top. He decided to try to trim his toenail and ultimately took the entire nail off of his left great toe. 03/02/2022: His lateral leg wound continues to improve, as does the wound on his left great toe. Unfortunately, it appears that somehow his foot got wet and moisture seeped in through the opening causing his skin to lift. There is a large wound now overlying his first metatarsal on both the plantar, medial, and dorsal portion of his foot. There is necrotic tissue and slough present underneath the shaggy macerated skin. 03/08/2022: The lateral leg wound is smaller again today. There is just a light layer of slough and eschar on the surface. The great toe wound is smaller again today. The first metatarsal wound is a little bit smaller today and does not look nearly as necrotic and macerated. There is still slough and nonviable tissue present. 03/15/2022: The lateral leg wound is narrower and just has a little bit of light slough buildup. The first metatarsal wound still has a fair amount of moisture affecting the periwound skin. The great toe wound is healed. 03/22/2022: The lateral leg wound is now  isolated to just at the level of his knee. There is some eschar and slough accumulation. The first metatarsal head wound has epithelialized tremendously and is about half the size that it was last week. He still has some maceration on the top of his foot and a fungal odor is present. 03/29/2022: T oday the patient's foot was macerated, suggesting that the cast got wet. The patient has also been picking at his dry skin and has  enlarged the wound on his left lateral leg. In the time between having his cast removed and my evaluation, he had picked more dry skin and opened up additional wounds on his Achilles area and dorsal foot. The plantar first metatarsal head wound, however, is smaller and clean with just macerated callus around the perimeter and light slough on the surface. The lateral leg wound measured a little bit larger but is also fairly clean with eschar and minimal slough. 04/02/2022: The patient had vascular studies done last Friday and so his cast was not applied. He is here today to have that done. 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 04/05/2022: Both wounds are smaller and quite clean. There is just a little biofilm on the lateral leg wound. 10/20; the patient has a wound on the left lateral surgical incision at the level of his lateral knee this looks clean and improved. He is using silver alginate. He also has an area on his left medial foot for which she is using Hydrofera Blue under a total contact cast both wounds are measuring smaller 04/20/2022: The plantar foot wound has contracted considerably and is very close to closing. The lateral leg wound was measured a little larger, but there was a tiny open area that was included in the measurements that was not included last week. He has some eschar around the perimeter but otherwise the wound looks clean. 04/27/2022: The lateral leg wound looks better this week. He says that midweek, he felt it was very dry and began  applying hydrogel to the site. I think this was beneficial. The foot wound is nearly closed underneath a thick layer of dry skin and callus. 05/04/2022: The foot wound is healed. He has developed a new small ulcer on his anterior tibial surface about midway up his leg. It has a little slough on the surface. The lateral leg wound still is fairly dry, but clean with just a little biofilm on the surface. 05/11/2022: The wound on his foot reopened on Wednesday. A large blister formed which then broke open revealing the fat layer underneath. The ulcer on his anterior tibial surface is a little bit larger this week. The lateral leg wound has much better moisture balance this week. Fortunately, prior to his foot wound [name], [name] ([id number]) [id number] Page 18 of 25 reopening, he did get the cast made for his orthotic. 05/15/2022: Already, the left medial foot wound has improved. The tissue is less macerated and the surface is clean. The ulcer on his anterior tibial surface continues to enlarge. This seems likely secondary to accumulated moisture. The lateral leg wound continues to have an improved moisture balance with the use of collagen. 05/25/2022: The medial foot wound continues to contract. It is now substantially smaller with just a little slough on the surface. The anterior tibial surface wound continues to enlarge further. Once again, this seems to be secondary to moisture. The lateral leg wound does not seem to be changing much in size, but the moisture balance is better. 06/01/2022: The anterior tibial wound is closed. The medial foot wound is down to just a very small, couple of millimeters, opening. The lateral leg wound has good moisture balance, but remains unchanged in size. 12/15; the patient's anterior tibial wound has reopened, however the area on his right first metatarsal head is closed. The major wound is actually on the superior part of his surgical  wound in the left lateral thigh. Not a completely viable surface under  illumination. This may at some point require a debridement I think he is currently using Prisma. As noted the left medial foot wound has closed 06/14/2022: The anterior tibial wound has closed. The lateral leg wound has a better surface but is basically unchanged in size. The left medial foot wound has reopened. It looks as though there was some callus accumulation and moisture got under the callus which caused the tissue to break down again. 06/21/2022: A new wound has opened up just distal to the previous anterior tibial wound. It is small but has hypertrophic granulation tissue present. The lateral leg wound is a little bit narrower and has a layer of slough on the surface. The left medial foot wound is down to just a pinhole. His custom orthotics should be available next week. 06/28/2022: The wound on his first metatarsal head has healed. He has developed a new small wound on his medial lower leg, in an old scar site. The lateral leg wound continues to contract but continues to accumulate slough, as well. 07/03/2022: Despite wearing his custom orthopedic shoes, he managed to reopen the wound on his first metatarsal head. He says he thinks his foot got wet and then some skin lifted up and he peeled this away. Both of the lower leg wounds are smaller and have some dry eschar on the surface. The lateral leg wound is quite a bit narrower today. 07/12/2022: The medial lower leg wound is closed. The anterior lower leg wound has contracted considerably. The lateral upper leg wound is narrower with a layer of slough on the surface. The first metatarsal head wound is also smaller, but had copious drainage which saturated the foam border dressing and resulted in some periwound tissue maceration. Fortunately there was no breakdown at this site. 07/19/2022: The lower leg shows signs of significant maceration; I think he must be sweating  excessively inside his cast. There are several areas of skin breakdown present. The wound on his foot is smaller and that on his lateral leg is narrower and is shorter by about a centimeter. 07/26/2022: Last week we used a zinc Coflex wrap prior to applying his total contact cast and this has had the effect of keeping his skin from getting macerated this week. The anterior leg wound has epithelialized substantially. The lateral leg wound is significantly smaller with just a bit of slough on the surface. The first metatarsal head wound is also smaller this week. 08/02/2022: The anterior leg wound was closed on arrival, but while he was sitting in the room, he picked it open again. The lateral leg wound is smaller with just a little slough on the surface and the first metatarsal head wound has contracted further, as well. 08/09/2022: The first metatarsal head wound is covered with callus. Underneath the callus, it is nearly completely closed. The lateral leg wound is smaller again this week. The anterior leg wound looks better, but he has such heavy buildup of old skin, that moisture is getting underneath this, becoming trapped, and causing the underlying skin to get macerated and open up. 08/16/2022: The first metatarsal head wound is closed. The lateral leg wound continues to contract and is quite a bit smaller again this week. There is just a small, superficial opening remaining on his anterior tibial surface. 08/23/2022: The first metatarsal head wound has, by some miracle, remained closed. The lateral leg wound is substantially smaller with multiple areas of epithelialization. The anterior tibial surface wound is also quite a bit smaller and  very clean. 08/30/2022: Unfortunately, his first metatarsal head wound opened up again. It happened in the same fashion as it has on prior occasions. Moisture got under dried skin/callus and created a wound when he removed his sock, taking the skin with it. The anterior  tibial surface has a thick shell of hyperkeratotic skin. This has been contributing to ongoing repeat wounding events as moisture gets underneath this and causes tissue breakdown. 3/15; patient presents for follow-up. His anterior left leg wound has healed. He still has the wound to the left lateral aspect and left first met head. We have been using silver alginate and endoform to these areas under [employer]. He has no issues or complaints today. He has been taking Augmentin and reports improvement to his symptoms to the left first met head. 09/13/2022: He has accumulated more thick dry skin in sheets on his lower leg. The lateral leg wound is about the same size and the left first metatarsal head wound is a little bit smaller. There is slough on both surfaces. There is callus buildup around the foot wound. 09/20/2022: The lateral leg wound is a little bit narrower and the left first metatarsal head wound also seems to have contracted slightly. There is slough on both surfaces. He has a little skin breakdown on his anterior tibial surface. 09/27/2022: The lateral leg wound continues to contract and is quite clean. The first metatarsal head wound is also smaller. There is some perimeter callus and slough accumulation on the foot. The anterior tibial surface is closed. 10/04/2022: Both of his wounds are smaller today, particularly the first metatarsal head wound. 10/18/2022: He missed his appointment last week and ended up cutting off his wrap on Saturday. The anterior tibial wound reopened. It is fairly superficial with a little bit of slough on the surface. His lateral leg wound is smaller with some slough and eschar buildup. The first metatarsal head wound is also smaller with some callus and slough accumulation. 10/25/2022: All wounds are smaller. There is slough and eschar on the lateral leg and slough and callus on the plantar foot wound. The anterior tibial wound is clean and flush with the surrounding  skin. No debris accumulation here. 11/01/2022: The wounds are all smaller again this week. There is slough on the lateral leg and some minimal slough and eschar on the anterior tibial wound. There is callus accumulation on the first metatarsal head site, along with slough. There is also a yeasty odor coming from the foot. 11/08/2022: The lateral leg wound is smaller except where he picked some skin while waiting to be seen. There is a little bit of slough on the surface. The anterior tibial wound is closed. The first metatarsal head site has gotten macerated once again and has a lot of spongy wet tissue and callus around it. No yeast odor today. 11/15/2022: The lateral leg wound continues to contract. There is now a band of epithelium dividing it into 2 areas. Minimal slough accumulation. He managed to pick open a new wound on his medial lower leg. The first metatarsal head site is smaller with some callus accumulation but no tissue maceration. [name], ECHARD ([id number]) [id number] Page 19 of 25 11/22/2022: The lateral leg wound is smaller again by about a third today. There is a little bit of slough on the surface with some periwound eschar. The new wound that he picked open last week has healed but he picked open 3 new small wounds on his anterior tibial surface, once  again due to his picking at his skin. The first metatarsal head wound looks about the same. There has been some callus accumulation and there is more edema present, as he was not put in a compression wrap last week. 11/29/2022: The lateral leg wound is smaller again today. There is a little periwound eschar and some slough present. The wounds on his anterior tibial surface are all closed except for 1 that has a little bit of slough on the open portion with eschar covering it. The first metatarsal head wound measured a little bit smaller today, but mostly looks about the same. Edema control is better. 12/06/2022:  The anterior tibial wound is closed. The lateral leg wound is smaller with some slough and eschar accumulation. The plantar foot ulcer is about the same size, but the tissue does show some evidence of the fact that he was on his feet quite a bit more this past week; there was a death in his family. There has been more callus accumulation and the tissues are little bit more purpleish. 12/13/2022: He picked the skin on his anterior tibia and reopened a wound there while he was waiting to be seen in clinic. The lateral leg wound is much smaller with a little bit of slough and eschar. The plantar foot ulcer is smaller today with callus and slough buildup, but without the pressure induced tissue injury that was seen last week. 12/20/2022: The anterior tibial wound has healed. The lateral leg wound is smaller again this week. There is a little bit of eschar buildup on the surface. The foot had a fairly strong odor coming from it that persisted even after washing. The wound itself looks like its gotten a little smaller but he has built up thick callus, once again. 12/26/2022: The lateral leg wound is down to just a narrow superficial slit. There is slough and a little bit of dry skin present. The foot is in much better shape today. There is less callus accumulation and no odor. The skin edges are starting to roll inward, however. 01/03/2023: The lateral leg wound is healed. The skin is quite dry but the wound has closed. The foot continues to contract. He has a fair amount of periwound callus accumulation and the surface of the is a little drier than ideal. 01/10/2023: The large lateral leg wound remains closed. He managed to pick off some of his dry skin and has multiple small open superficial wounds on his lower leg. The foot wound measures smaller, but he has a blister immediately adjacent to it. 01/17/2023: The large lateral leg wound remains closed. The small superficial wounds on his lower leg have also  closed. The foot wound is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 05/16/2023 Medical Record Number: [id number] Patient Account Number: [id number] Date of Birth/Sex: Treating RN: 25-May-1951 (72 y.o. 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 hypertension (idiopathic) with inflammation of left lower extremity L85.9 Epidermal thickening, unspecified 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

## 2023-06-13 ENCOUNTER — Encounter (HOSPITAL_BASED_OUTPATIENT_CLINIC_OR_DEPARTMENT_OTHER): Payer: Medicare Other | Attending: General Surgery | Admitting: General Surgery

## 2023-06-13 DIAGNOSIS — I890 Lymphedema, not elsewhere classified: Secondary | ICD-10-CM | POA: Insufficient documentation

## 2023-06-13 DIAGNOSIS — I87332 Chronic venous hypertension (idiopathic) with ulcer and inflammation of left lower extremity: Secondary | ICD-10-CM | POA: Insufficient documentation

## 2023-06-13 DIAGNOSIS — L97828 Non-pressure chronic ulcer of other part of left lower leg with other specified severity: Secondary | ICD-10-CM | POA: Diagnosis not present

## 2023-06-13 DIAGNOSIS — L97528 Non-pressure chronic ulcer of other part of left foot with other specified severity: Secondary | ICD-10-CM | POA: Diagnosis not present

## 2023-06-13 DIAGNOSIS — L97128 Non-pressure chronic ulcer of left thigh with other specified severity: Secondary | ICD-10-CM | POA: Insufficient documentation

## 2023-06-13 DIAGNOSIS — L859 Epidermal thickening, unspecified: Secondary | ICD-10-CM | POA: Diagnosis not present

## 2023-06-13 DIAGNOSIS — E1151 Type 2 diabetes mellitus with diabetic peripheral angiopathy without gangrene: Secondary | ICD-10-CM | POA: Insufficient documentation

## 2023-06-14 ENCOUNTER — Encounter: Payer: Self-pay | Admitting: Nurse Practitioner

## 2023-06-14 DIAGNOSIS — E1165 Type 2 diabetes mellitus with hyperglycemia: Secondary | ICD-10-CM

## 2023-06-14 NOTE — Progress Notes (Signed)
SYER, VERHOEF ([id number]) [id number] Page 1 of 12 Visit Report for 06/13/2023 Arrival Information Details Patient Name: Date of Service: [name], [name] 06/13/2023 10:00 A M Medical Record Number: [id number] Patient Account Number: [id number] Date of Birth/Sex: Treating RN: 1950/12/14 (72 y.o. M) Primary Care Julieanna Geraci: [name] Other Clinician: Referring Dencil Cayson: Treating Yousaf Sainato/Extender: [name] in Treatment: 113 Visit Information History Since Last Visit Added or deleted any medications: No Patient Arrived: Ambulatory Any new allergies or adverse reactions: No Arrival Time: 10:00 Had a fall or experienced change in No Accompanied By: self activities of daily living that may affect Transfer Assistance: None risk of falls: Patient Identification Verified: Yes Signs or symptoms of abuse/neglect since last visito No Secondary Verification Process Completed: Yes Hospitalized since last visit: No Patient Requires Transmission-Based Precautions: No Implantable device outside of the clinic excluding No Patient Has Alerts: Yes cellular tissue based products placed in the center since last visit: Has Dressing in Place as Prescribed: Yes Has Compression in Place as Prescribed: Yes Pain Present Now: No Electronic Signature(s) Unsigned Previous Signature: 06/13/2023 10:16:03 AM Version By: Dayton Scrape Entered By: Zenaida Deed on 06/13/2023 07:27:10 -------------------------------------------------------------------------------- Encounter Discharge Information Details Patient Name: Date of Service: [name]. 06/13/2023 10:00 A M Medical Record Number: [id number] Patient Account Number: [id number] Date of Birth/Sex: Treating RN: 1951/02/20 (72 y.o. [name] Primary Care Roni Friberg: [name] Other Clinician: Referring Makenna Macaluso: Treating Calvyn Kurtzman/Extender: [name] in  Treatment: [id number] Encounter Discharge Information Items Post Procedure Vitals Discharge Condition: Stable Temperature (F): 97.9 Ambulatory Status: Ambulatory Pulse (bpm): 69 Discharge Destination: Home Respiratory Rate (breaths/min): 18 Transportation: Private Auto Blood Pressure (mmHg): 134/63 Accompanied By: self Schedule Follow-up Appointment: Yes Clinical Summary of Care: Patient Declined Electronic Signature(s) Unsigned Entered ByZenaida Deed on 06/13/2023 14:30:07 Signature(s): [name] ([id number]) [phone] Date(s): _Nursing_[id number].pdf Page 2 of 12 -------------------------------------------------------------------------------- Lower Extremity Assessment Details Patient Name: Date of Service: [name], [name] 06/13/2023 10:00 A M Medical Record Number: [id number] Patient Account Number: [id number] Date of Birth/Sex: Treating RN: [birth year] (72 y.o. [name] Primary Care Isahia Hollerbach: [name] Other Clinician: Referring Ashtyn Meland: Treating Makelle Marrone/Extender: [name] in Treatment: 113 Edema Assessment Assessed: [name]: No] [name]: No] Edema: [Left: Ye] [Right: s] Calf Left: Right: Point of Measurement: 41 cm From Medial Instep 46.5 cm Ankle Left: Right: Point of Measurement: 10 cm From Medial Instep 27.5 cm Vascular Assessment Pulses: Dorsalis Pedis Palpable: [Left:Yes] Extremity colors, hair growth, and conditions: Extremity Color: [Left:Hyperpigmented] Hair Growth on Extremity: [Left:No] Temperature of Extremity: [Left:Warm] Capillary Refill: [Left:< 3 seconds] Dependent Rubor: [Left:No No] Electronic Signature(s) Unsigned Entered ByZenaida Deed on 06/13/2023 07:28:54 -------------------------------------------------------------------------------- Multi Wound Chart Details Patient Name: Date of Service: [name], [name] 06/13/2023 10:00 A M Medical Record Number: [id number] Patient Account Number:  [id number] Date of Birth/Sex: Treating RN: 1951/01/14 (72 y.o. M) Primary Care Lailani Tool: [name] Other Clinician: Referring Meshia Rau: Treating Janashia Parco/Extender: [name] in Treatment: 113 Vital Signs Height(in): 74 Capillary Blood Glucose(mg/dl): 301 Weight(lbs): 601 Pulse(bpm): 69 Body Mass Index(BMI): 30.6 Blood Pressure(mmHg): 134/73 Temperature(F): 97.9 Respiratory Rate(breaths/min): 20 [name], [name] ([id number]):Photos:] [phone].pdf Page 3 of 12:18R 22R 36] Left, Plantar Metatarsal head first Left, Proximal, Lateral Lower Leg Left, Anterior Lower Leg Wound Location: Gradually Appeared Bump Skin T ear/Laceration Wounding Event: 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 Primary Etiology: Extremity Extremity Glaucoma, Sleep Apnea, Glaucoma, Sleep Apnea,  Glaucoma, Sleep Apnea, Comorbid History: Hypertension, Peripheral Arterial Hypertension, Peripheral Arterial Hypertension, Peripheral Arterial Disease, Peripheral Venous Disease, Disease, Peripheral Venous Disease, Disease, Peripheral Venous Disease, Type II Diabetes, Gout, Osteoarthritis, Type II Diabetes, Gout, Osteoarthritis, Type II Diabetes, Gout, Osteoarthritis, Neuropathy Neuropathy Neuropathy 08/23/2020 06/03/2021 05/02/2023 Date Acquired: 113 105 6 Weeks of Treatment: Open Open Open Wound Status: Yes Yes No Wound Recurrence: No Yes No Clustered Wound: N/A 0 N/A Clustered Quantity: 2x2.3x0.2 1.6x0.9x0.1 1x1.8x0.1 Measurements L x W x D (cm) 3.613 1.131 1.414 A (cm) : rea 0.723 0.113 0.141 Volume (cm) : 74.40% 31.40% -233.50% % Reduction in Area: 74.40% 91.40% -235.70% % Reduction in Volume: Grade 2 Full Thickness With Exposed Support Grade 1 Classification: Structures Medium Medium Medium Exudate Amount: Serosanguineous Serous Serous Exudate Type: red, brown amber amber Exudate Color: Epibole Distinct,  outline attached Distinct, outline attached Wound Margin: Large (67-100%) Large (67-100%) Large (67-100%) Granulation Amount: Red Pink Red Granulation Quality: Small (1-33%) Small (1-33%) Small (1-33%) Necrotic Amount: Fat Layer (Subcutaneous Tissue): Yes Fat Layer (Subcutaneous Tissue): Yes Fat Layer (Subcutaneous Tissue): Yes Exposed Structures: Fascia: No Fascia: No Fascia: No Tendon: No Tendon: No Tendon: No Muscle: No Muscle: No Muscle: No Joint: No Joint: No Joint: No Bone: No Bone: No Bone: No None Small (1-33%) Large (67-100%) Epithelialization: Callus: Yes Scarring: Yes No Abnormalities Noted Periwound Skin Texture: Dry/Scaly: Yes Dry/Scaly: Yes Dry/Scaly: Yes Periwound Skin Moisture: No Abnormalities Noted No Abnormalities Noted Hemosiderin Staining: Yes Periwound Skin Color: No Abnormality No Abnormality No Abnormality Temperature: Wound Number: 37 N/A N/A Photos: N/A N/A Left, Medial Foot N/A N/A Wound Location: Trauma N/A N/A Wounding Event: Diabetic Wound/Ulcer of the Lower N/A N/A Primary Etiology: Extremity Glaucoma, Sleep Apnea, N/A N/A Comorbid History: Hypertension, Peripheral Arterial Disease, Peripheral Venous Disease, Type II Diabetes, Gout, Osteoarthritis, Neuropathy 04/29/2023 N/A N/A Date Acquired: 6 N/A N/A Weeks of Treatment: Open N/A N/A Wound Status: No N/A N/A Wound Recurrence: No N/A N/A Clustered Wound: N/A N/A N/A Clustered Quantity: 0.1x0.1x0.1 N/A N/A Measurements L x W x D (cm) 0.008 N/A N/A A (cm) : rea 0.001 N/A N/A Volume (cm) : 97.50% N/A N/A % Reduction in A rea: 96.80% N/A N/A % Reduction in Volume: Grade 1 N/A N/A Classification: Medium N/A N/A Exudate A mount: [name] ([id number]) [id number] Page 4 of 12 Serous N/A N/A Exudate Type: amber N/A N/A Exudate Color: Distinct, outline attached N/A N/A Wound Margin: Large (67-100%) N/A N/A Granulation  Amount: Red, Pink N/A N/A Granulation Quality: Small (1-33%) N/A N/A Necrotic Amount: Fat Layer (Subcutaneous Tissue): Yes N/A N/A Exposed Structures: Fascia: No Tendon: No Muscle: No Joint: No Bone: No Large (67-100%) N/A N/A Epithelialization: Callus: Yes N/A N/A Periwound Skin Texture: No Abnormalities Noted N/A N/A Periwound Skin Moisture: No Abnormalities Noted N/A N/A Periwound Skin Color: No Abnormality N/A N/A Temperature: Treatment Notes Electronic Signature(s) Signed: 06/13/2023 12:54:48 PM By: [name] MD FACS Entered By: [name] on 06/13/2023 07:32:11 -------------------------------------------------------------------------------- Multi-Disciplinary Care Plan Details Patient Name: Date of Service: [name]. 06/13/2023 10:00 A M Medical Record Number: [id number] Patient Account Number: [id number] Date of Birth/Sex: Treating RN: January 28, 1951 (72 y.o. [name] Primary Care Alesi Zachery: [name] Other Clinician: Referring Julie-Anne Torain: Treating Makynlie Rossini/Extender: [name] in Treatment: 113 Multidisciplinary Care Plan reviewed with physician Active Inactive Venous Leg Ulcer Nursing Diagnoses: Knowledge deficit related to disease process and management Potential for venous Insuffiency (use before diagnosis confirmed) Goals: Patient will maintain optimal edema control  Date Initiated: 07/27/2021 Target Resolution Date: 06/21/2023 Goal Status: Active Interventions: Assess peripheral edema status every visit. Treatment Activities: Therapeutic compression applied : 07/27/2021 Notes: Wound/Skin Impairment Nursing Diagnoses: Impaired tissue integrity Knowledge deficit related to ulceration/compromised skin integrity Goals: Patient will have a decrease in wound volume by X% from date: (specify in notes) Date Initiated: 04/12/2021 Date Inactivated: 01/04/2022 Target Resolution Date: 04/23/2021 Goal Status:  Met Patient/caregiver will verbalize understanding of skin care regimen Date Initiated: 01/04/2022 Target Resolution Date: 06/21/2023 JAZIEL, POLHEMUS ([id number]) [phone].pdf Page 5 of 12 Goal Status: Active Ulcer/skin breakdown will have a volume reduction of 30% by week 4 Date Initiated: 04/12/2021 Date Inactivated: 04/27/2021 Target Resolution Date: 04/27/2021 Goal Status: Unmet Unmet Reason: infection Ulcer/skin breakdown will have a volume reduction of 50% by week 8 Date Initiated: 04/27/2021 Date Inactivated: 06/29/2021 Target Resolution Date: 06/24/2021 Goal Status: Met Interventions: Assess patient/caregiver ability to obtain necessary supplies Assess patient/caregiver ability to perform ulcer/skin care regimen upon admission and as needed Assess ulceration(s) every visit Notes: Electronic Signature(s) Unsigned Entered By: Zenaida Deed on 06/13/2023 07:34:06 -------------------------------------------------------------------------------- Pain Assessment Details Patient Name: Date of Service: RODENY, DEUSCHLE 06/13/2023 10:00 A M Medical Record Number: [id number] Patient Account Number: [id number] Date of Birth/Sex: Treating RN: 1950-11-11 (72 y.o. M) Primary Care Ludie Hudon: [name] Other Clinician: Referring Elliette Seabolt: Treating Ieasha Boerema/Extender: [name] in Treatment: 113 Active Problems Location of Pain Severity and Description of Pain Patient Has Paino No Site Locations Rate the pain. Current Pain Level: 0 Pain Management and Medication Current Pain Management: Electronic Signature(s) Unsigned Previous Signature: 06/13/2023 10:16:03 AM Version By: Dayton Scrape Entered By: Zenaida Deed on 06/13/2023 07:27:57 Signature(s): [name] ([id number]) [phone] Date(s): [id number].pdf Page 6 of 12 -------------------------------------------------------------------------------- Patient/Caregiver  Education Details Patient Name: Date of Service: [place], [name] 12/19/2024andnbsp10:00 A M Medical Record Number: [id number] Patient Account Number: [id number] Date of Birth/Gender: Treating RN: [birth year] (72 y.o. [name] Primary Care Physician: [name] Other Clinician: Referring Physician: Treating Physician/Extender: [name] in Treatment: 113 Education Assessment Education Provided To: Patient Education Topics Provided Offloading: Methods: Explain/Verbal Responses: Reinforcements needed, State content correctly Venous: Methods: Explain/Verbal Responses: Reinforcements needed, State content correctly Wound/Skin Impairment: Methods: Explain/Verbal Responses: Reinforcements needed, State content correctly Electronic Signature(s) Unsigned Entered By: Zenaida Deed on 06/13/2023 07:34:46 -------------------------------------------------------------------------------- Wound Assessment Details Patient Name: Date of Service: [name], [name] 06/13/2023 10:00 A M Medical Record Number: [id number] Patient Account Number: [id number] Date of Birth/Sex: Treating RN: [birth year] (72 y.o. [name] Primary Care Neha Waight: [name] Other Clinician: Referring Livana Yerian: Treating Anapaula Severt/Extender: [name] in Treatment: 113 Wound Status Wound Number: 18R Primary Diabetic Wound/Ulcer of the Lower Extremity Etiology: Wound Location: Left, Plantar Metatarsal head first Wound Open Wounding Event: Gradually Appeared Status: Date Acquired: 08/23/2020 Comorbid Glaucoma, Sleep Apnea, Hypertension, Peripheral Arterial Disease, Weeks Of Treatment: 113 History: Peripheral Venous Disease, Type II Diabetes, Gout, Osteoarthritis, Clustered Wound: No Neuropathy Photos [name], [name] ([id number]) [id number] Page 7 of 12 Wound Measurements Length: (cm) 2 Width: (cm) 2.3 Depth: (cm)  0.2 Area: (cm) 3.613 Volume: (cm) 0.723 % Reduction in Area: 74.4% % Reduction in Volume: 74.4% Epithelialization: None Tunneling: No Undermining: No Wound Description Classification: Grade 2 Wound Margin: Epibole Exudate Amount: Medium Exudate Type: Serosanguineous Exudate Color: red, brown Foul Odor After Cleansing: No Slough/Fibrino Yes Wound Bed Granulation Amount: Large (67-100%) Exposed Structure Granulation Quality: Red Fascia Exposed: No Necrotic Amount: Small (1-33%) Fat Layer (  Subcutaneous Tissue) Exposed: Yes Necrotic Quality: Adherent Slough Tendon Exposed: No Muscle Exposed: No Joint Exposed: No Bone Exposed: No Periwound Skin Texture Texture Color No Abnormalities Noted: No No Abnormalities Noted: Yes Callus: Yes Temperature / Pain Temperature: No Abnormality Moisture No Abnormalities Noted: Yes Treatment Notes Wound #18R (Metatarsal head first) Wound Laterality: Plantar, Left Cleanser Normal Saline Discharge Instruction: Cleanse the wound with Normal Saline prior to applying a clean dressing using gauze sponges, not tissue or cotton balls. Soap and Water Discharge Instruction: May shower and wash wound with dial antibacterial soap and water prior to dressing change. Wound Cleanser Discharge Instruction: Cleanse the wound with wound cleanser prior to applying a clean dressing using gauze sponges, not tissue or cotton balls. Peri-Wound Care Topical Primary Dressing Promogran Prisma Matrix, 4.34 (sq in) (silver collagen) Discharge Instruction: Moisten collagen with saline or hydrogel Secondary Dressing Optifoam Non-Adhesive Dressing, 4x4 in Discharge Instruction: Apply over primary dressing as directed. Woven Gauze Sponges 2x2 in Discharge Instruction: Apply over primary dressing as directed. Secured With 49M Medipore H Soft Cloth Surgical T ape, 4 x 10 (in/yd) Discharge Instruction: Secure with tape as directed. LARZ, BISSETTE ([id number])  [id number] Page 8 of 12 Compression Wrap Compression Stockings Add-Ons Electronic Signature(s) Unsigned Previous Signature: 06/13/2023 10:16:03 AM Version By: Dayton Scrape Entered By: Zenaida Deed on 06/13/2023 07:31:39 -------------------------------------------------------------------------------- Wound Assessment Details Patient Name: Date of Service: [name], [name] 06/13/2023 10:00 A M Medical Record Number: [id number] Patient Account Number: [id number] Date of Birth/Sex: Treating RN: 07/02/1950 (72 y.o. [name] Primary Care Magdaleno Lortie: [name] Other Clinician: Referring Ashlee Bewley: Treating Kiona Blume/Extender: [name] in Treatment: 113 Wound Status Wound Number: 22R Primary Cyst Etiology: Wound Location: Left, Proximal, Lateral Lower Leg Wound Open Wounding Event: Bump Status: Date Acquired: 06/03/2021 Comorbid Glaucoma, Sleep Apnea, Hypertension, Peripheral Arterial Disease, Weeks Of Treatment: 105 History: Peripheral Venous Disease, Type II Diabetes, Gout, Osteoarthritis, Clustered Wound: Yes Neuropathy Photos Wound Measurements Length: (cm) Width: (cm) Depth: (cm) Clustered Quantity: Area: (cm) Volume: (cm) 1.6 % Reduction in Area: 31.4% 0.9 % Reduction in Volume: 91.4% 0.1 Epithelialization: Small (1-33%) 0 Tunneling: No 1.131 Undermining: No 0.113 Wound Description Classification: Full Thickness With Exposed Support Structures Wound Margin: Distinct, outline attached Exudate Amount: Medium Exudate Type: Serous Exudate Color: amber Foul Odor After Cleansing: No Slough/Fibrino No Wound Bed Granulation Amount: Large (67-100%) Exposed Structure Granulation Quality: Red Fascia Exposed: No Necrotic Amount: None Present (0%) Fat Layer (Subcutaneous Tissue) Exposed: Yes Tendon Exposed: No Muscle Exposed: No Joint Exposed: No Bone Exposed: No [name] ([id number])  [id number] Page 9 of 12 Periwound Skin Texture Texture Color No Abnormalities Noted: No No Abnormalities Noted: Yes Scarring: Yes Temperature / Pain Temperature: No Abnormality Moisture No Abnormalities Noted: Yes Treatment Notes Wound #22R (Lower Leg) Wound Laterality: Left, Lateral, Proximal Cleanser Soap and Water Discharge Instruction: May shower and wash wound with dial antibacterial soap and water prior to dressing change. Wound Cleanser Discharge Instruction: Cleanse the wound with wound cleanser prior to applying a clean dressing using gauze sponges, not tissue or cotton balls. Peri-Wound Care Topical Primary Dressing Promogran Prisma Matrix, 4.34 (sq in) (silver collagen) Discharge Instruction: Moisten collagen with saline or hydrogel Secondary Dressing Woven Gauze Sponge, Non-Sterile 4x4 in Discharge Instruction: Apply over primary dressing as directed. Secured With 49M Medipore H Soft Cloth Surgical T ape, 4 x 10 (in/yd) Discharge Instruction: Secure with tape as directed. Compression Wrap Compression Stockings Add-Ons Electronic Signature(s) Unsigned Previous Signature: 06/13/2023  10:16:03 AM Version By: Dayton Scrape Entered By: Zenaida Deed on 06/13/2023 07:32:18 -------------------------------------------------------------------------------- Wound Assessment Details Patient Name: Date of Service: [name], [name] 06/13/2023 10:00 A M Medical Record Number: [id number] Patient Account Number: [id number] Date of Birth/Sex: Treating RN: 08-Mar-1951 (72 y.o. [name] Primary Care Dason Mosley: [name] Other Clinician: Referring Mohmed Farver: Treating Harlow Carrizales/Extender: [name] in Treatment: 113 Wound Status Wound Number: 36 Primary Diabetic Wound/Ulcer of the Lower Extremity Etiology: Wound Location: Left, Anterior Lower Leg Wound Open Wounding Event: Skin Tear/Laceration Status: Date Acquired:  05/02/2023 Comorbid Glaucoma, Sleep Apnea, Hypertension, Peripheral Arterial Disease, Weeks Of Treatment: 6 History: Peripheral Venous Disease, Type II Diabetes, Gout, Osteoarthritis, Clustered Wound: No Neuropathy Photos ROSHAUN, KOREY ([id number]) [id number] Page 10 of 12 Wound Measurements Length: (cm) 1 Width: (cm) 1.8 Depth: (cm) 0.1 Area: (cm) 1.414 Volume: (cm) 0.141 % Reduction in Area: -233.5% % Reduction in Volume: -235.7% Epithelialization: Medium (34-66%) Tunneling: No Undermining: No Wound Description Classification: Grade 1 Wound Margin: Distinct, outline attached Exudate Amount: Medium Exudate Type: Serous Exudate Color: amber Foul Odor After Cleansing: No Slough/Fibrino Yes Wound Bed Granulation Amount: Medium (34-66%) Exposed Structure Granulation Quality: Red, Pink Fascia Exposed: No Necrotic Amount: Medium (34-66%) Fat Layer (Subcutaneous Tissue) Exposed: Yes Necrotic Quality: Adherent Slough Tendon Exposed: No Muscle Exposed: No Joint Exposed: No Bone Exposed: No Periwound Skin Texture Texture Color No Abnormalities Noted: Yes No Abnormalities Noted: No Hemosiderin Staining: Yes Moisture No Abnormalities Noted: Yes Temperature / Pain Temperature: No Abnormality Treatment Notes Wound #36 (Lower Leg) Wound Laterality: Left, Anterior Cleanser Soap and Water Discharge Instruction: May shower and wash wound with dial antibacterial soap and water prior to dressing change. Wound Cleanser Discharge Instruction: Cleanse the wound with wound cleanser prior to applying a clean dressing using gauze sponges, not tissue or cotton balls. Peri-Wound Care Topical Primary Dressing Maxorb Extra Ag+ Alginate Dressing, 2x2 (in/in) Discharge Instruction: Apply to wound bed as instructed Secondary Dressing Zetuvit Plus Silicone Border Dressing 4x4 (in/in) Discharge Instruction: Apply silicone border over primary dressing as  directed. Secured With Compression Wrap Compression Stockings Add-Ons Electronic Signature(s) [name], [name] ([id number]) [id number] Page 11 of 12 Unsigned Previous Signature: 06/13/2023 10:16:03 AM Version By: Dayton Scrape Entered By: Zenaida Deed on 06/13/2023 07:33:15 -------------------------------------------------------------------------------- Wound Assessment Details Patient Name: Date of Service: [name], [name] 06/13/2023 10:00 A M Medical Record Number: [id number] Patient Account Number: [id number] Date of Birth/Sex: Treating RN: 06-26-1950 (72 y.o. [name] Primary Care Josefina Rynders: [name] Other Clinician: Referring Shalonda Sachse: Treating Khalea Ventura/Extender: [name] in Treatment: 113 Wound Status Wound Number: 37 Primary Diabetic Wound/Ulcer of the Lower Extremity Etiology: Wound Location: Left, Medial Foot Wound Healed - Epithelialized Wounding Event: Trauma Status: Date Acquired: 04/29/2023 Comorbid Glaucoma, Sleep Apnea, Hypertension, Peripheral Arterial Disease, Weeks Of Treatment: 6 History: Peripheral Venous Disease, Type II Diabetes, Gout, Osteoarthritis, Clustered Wound: No Neuropathy Photos Wound Measurements Length: (cm) Width: (cm) Depth: (cm) Area: (cm) Volume: (cm) 0 % Reduction in Area: 100% 0 % Reduction in Volume: 100% 0 Epithelialization: Large (67-100%) 0 Tunneling: No 0 Undermining: No Wound Description Classification: Grade 1 Wound Margin: Distinct, outline attached Exudate Amount: None Present Foul Odor After Cleansing: No Slough/Fibrino No Wound Bed Granulation Amount: None Present (0%) Exposed Structure Necrotic Amount: None Present (0%) Fascia Exposed: No Fat Layer (Subcutaneous Tissue) Exposed: No Tendon Exposed: No Muscle Exposed: No Joint Exposed: No Bone Exposed: No Periwound Skin Texture Texture Color No Abnormalities Noted:  No No  Abnormalities Noted: Yes Callus: Yes Temperature / Pain Temperature: No Abnormality Moisture No Abnormalities NotedWANYE, [name] ([id number]) [id number] Page 12 of 12 Treatment Notes Wound #37 (Foot) Wound Laterality: Left, Medial Cleanser Peri-Wound Care Topical Primary Dressing Secondary Dressing Secured With Compression Wrap Compression Stockings Add-Ons Electronic Signature(s) Unsigned Previous Signature: 06/13/2023 10:16:03 AM Version By: Dayton Scrape Entered By: Zenaida Deed on 06/13/2023 07:43:16 -------------------------------------------------------------------------------- Vitals Details Patient Name: Date of Service: [name]. 06/13/2023 10:00 A M Medical Record Number: [id number] Patient Account Number: [id number] Date of Birth/Sex: Treating RN: 02/27/1951 (72 y.o. M) Primary Care Ivanna Kocak: [name] Other Clinician: Referring Dmiyah Liscano: Treating Amir Fick/Extender: [name] in Treatment: 113 Vital Signs Time Taken: 10:00 Temperature (F): 97.9 Height (in): 74 Pulse (bpm): 69 Weight (lbs): 238 Respiratory Rate (breaths/min): 20 Body Mass Index (BMI): 30.6 Blood Pressure (mmHg): 134/73 Capillary Blood Glucose (mg/dl): 063 Reference Range: 80 - 120 mg / dl Notes glucose per pt report at present Electronic Signature(s) Unsigned Previous Signature: 06/13/2023 10:16:03 AM Version By: Dayton Scrape Entered By: Zenaida Deed on 06/13/2023 07:27:35 Signature(s): Date(s):

## 2023-06-15 NOTE — Progress Notes (Signed)
SHAQUEZ, GIKAS ([id number]) [id number] Page 1 of 21 Visit Report for 06/13/2023 Chief Complaint Document Details Patient Name: Date of Service: [name], [name] 06/13/2023 10:00 A M Medical Record Number: [id number] Patient Account Number: [id number] Date of Birth/Sex: Treating RN: May 22, 1951 (73 y.o. M) Primary Care Provider: [name] Other Clinician: Referring Provider: Treating Provider/Extender: [name] in Treatment: 113 Information Obtained from: Patient Chief Complaint Left leg and foot ulcers 04/12/2021; patient is here for wounds on his left lower leg and left plantar foot over the first metatarsal head Electronic Signature(s) Signed: 06/13/2023 12:54:48 PM By: [name] MD FACS Entered By: [name] on 06/13/2023 07:32:19 -------------------------------------------------------------------------------- Debridement Details Patient Name: Date of Service: [name]. 06/13/2023 10:00 A M Medical Record Number: [id number] Patient Account Number: [id number] Date of Birth/Sex: Treating RN: 08-04-1950 (72 y.o. [name] Primary Care Provider: [name] Other Clinician: Referring Provider: Treating Provider/Extender: [name] in Treatment: 113 Debridement Performed for Assessment: Wound #22R Left,Proximal,Lateral Lower Leg Performed By: Physician [name], MD The following information was scribed by: Zenaida Deed The information was scribed for: [name] Debridement Type: Debridement Level of Consciousness (Pre-procedure): Awake and Alert Pre-procedure Verification/Time Out Yes - 10:35 Taken: Start Time: 10:39 Pain Control: Lidocaine 4% T opical Solution Percent of Wound Bed Debrided: 100% T Area Debrided (cm): otal 1.13 Tissue and other material debrided: Viable, Non-Viable, Slough, Subcutaneous, Slough Level: Skin/Subcutaneous  Tissue Debridement Description: Excisional Instrument: Curette Bleeding: Minimum Hemostasis Achieved: Pressure Procedural Pain: 0 Post Procedural Pain: 0 Response to Treatment: Procedure was tolerated well Level of Consciousness (Post- Awake and Alert procedure): Post Debridement Measurements of Total Wound Length: (cm) 1.6 Width: (cm) 0.9 Depth: (cm) 0.1 Volume: (cm) 0.113 [name], [name] ([id number]) [id number] Page 2 of 21 Character of Wound/Ulcer Post Debridement: Improved Post Procedure Diagnosis Same as Pre-procedure Electronic Signature(s) Signed: 06/13/2023 12:54:48 PM By: [name] MD FACS Signed: 06/14/2023 12:52:17 PM By: Zenaida Deed RN, BSN Entered By: Zenaida Deed on 06/13/2023 07:41:56 -------------------------------------------------------------------------------- Debridement Details Patient Name: Date of Service: [name]. 06/13/2023 10:00 A M Medical Record Number: [id number] Patient Account Number: [id number] Date of Birth/Sex: Treating RN: 1951-02-12 (72 y.o. [name] Primary Care Provider: [name] Other Clinician: Referring Provider: Treating Provider/Extender: [name] in Treatment: 113 Debridement Performed for Assessment: Wound #36 Left,Anterior Lower Leg Performed By: Physician [name], MD The following information was scribed by: Zenaida Deed The information was scribed for: [name] Debridement Type: Debridement Severity of Tissue Pre Debridement: Fat layer exposed Level of Consciousness (Pre-procedure): Awake and Alert Pre-procedure Verification/Time Out Yes - 10:35 Taken: Start Time: 10:39 Pain Control: Lidocaine 4% T opical Solution Percent of Wound Bed Debrided: 100% T Area Debrided (cm): otal 1.41 Tissue and other material debrided: Viable, Non-Viable, Slough, Subcutaneous, Slough Level: Skin/Subcutaneous Tissue Debridement  Description: Excisional Instrument: Curette Bleeding: Minimum Hemostasis Achieved: Pressure Procedural Pain: 0 Post Procedural Pain: 0 Response to Treatment: Procedure was tolerated well Level of Consciousness (Post- Awake and Alert procedure): Post Debridement Measurements of Total Wound Length: (cm) 1 Width: (cm) 1.8 Depth: (cm) 0.1 Volume: (cm) 0.141 Character of Wound/Ulcer Post Debridement: Improved Severity of Tissue Post Debridement: Fat layer exposed Post Procedure Diagnosis Same as Pre-procedure Electronic Signature(s) Signed: 06/13/2023 12:54:48 PM By: [name] MD FACS Signed: 06/14/2023 12:52:17 PM By: Zenaida Deed RN, BSN Entered By: Zenaida Deed on 06/13/2023 07:42:44 [name] ([id number]) [id number] Page 3 of  21 -------------------------------------------------------------------------------- Debridement Details Patient Name: Date of Service: [name], [name] 06/13/2023 10:00 A M Medical Record Number: [id number] Patient Account Number: [id number] Date of Birth/Sex: Treating RN: 06/24/1951 (72 y.o. [name] Primary Care Provider: [name] Other Clinician: Referring Provider: Treating Provider/Extender: [name] in Treatment: 113 Debridement Performed for Assessment: Wound #18R Left,Plantar Metatarsal head first Performed By: Physician [name], MD The following information was scribed by: Zenaida Deed The information was scribed for: [name] Debridement Type: Debridement Severity of Tissue Pre Debridement: Fat layer exposed Level of Consciousness (Pre-procedure): Awake and Alert Pre-procedure Verification/Time Out Yes - 10:35 Taken: Start Time: 10:39 Pain Control: Lidocaine 4% T opical Solution Percent of Wound Bed Debrided: 200% T Area Debrided (cm): otal 7.22 Tissue and other material debrided: Viable, Non-Viable, Callus, Slough, Subcutaneous,  Skin: Epidermis, Slough Level: Skin/Subcutaneous Tissue Debridement Description: Excisional Instrument: Curette Bleeding: Minimum Hemostasis Achieved: Pressure Procedural Pain: 0 Post Procedural Pain: 0 Response to Treatment: Procedure was tolerated well Level of Consciousness (Post- Awake and Alert procedure): Post Debridement Measurements of Total Wound Length: (cm) 2 Width: (cm) 2.3 Depth: (cm) 0.1 Volume: (cm) 0.361 Character of Wound/Ulcer Post Debridement: Improved Severity of Tissue Post Debridement: Fat layer exposed Post Procedure Diagnosis Same as Pre-procedure Electronic Signature(s) Signed: 06/13/2023 12:54:48 PM By: [name] MD FACS Signed: 06/14/2023 12:52:17 PM By: Zenaida Deed RN, BSN Entered By: Zenaida Deed on 06/13/2023 07:44:41 -------------------------------------------------------------------------------- HPI Details Patient Name: Date of Service: [name]. 06/13/2023 10:00 A M Medical Record Number: [id number] Patient Account Number: [id number] Date of Birth/Sex: Treating RN: 1951/01/05 (72 y.o. M) Primary Care Provider: [name] Other Clinician: Referring Provider: Treating Provider/Extender: [name] in Treatment: 113 History of Present Illness [name], [name] ([id number]) [id number] Page 4 of 21 HPI Description: 10/11/17; Mr. Trivino is a 72 year old man who tells me that in 2015 he slipped down the latter traumatizing his left leg. He developed a wound in the same spot the area that we are currently looking at. He states this closed over for the most part although he always felt it was somewhat unstable. In 2016 he hit the same area with the door of his car had this reopened. He tells me that this is never really closed although sometimes an inflow it remains open on a constant basis. He has not been using any specific dressing to this except for topical antibiotics the nature  of which were not really sure. His primary doctor did send him to see Dr. Jacinto Halim of interventional cardiology. He underwent an angiogram on 08/06/17 and he underwent a PTA and directional atherectomy of the lesser distal SFA and popliteal arteries which resulted in brisk improvement in blood flow. It was noted that he had 2 vessel runoff through the anterior tibial and peroneal. He is also been to see vascular and interventional radiologist. He was not felt to have any significant superficial venous insufficiency. Presumably is not a candidate for any ablation. It was suggested he come here for wound care. The patient is a type II diabetic on insulin. He also has a history of venous insufficiency. ABIs on the left were noncompressible in our clinic 10/21/17; patient we admitted to the clinic last week. 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 We put Iodosorb on him after an aggressive debridement and 3 layer compression. He complained of pain in his ankle and itching  with is skin in fact he scratched the area on the medial calf superiorly at the rim of our wraps and he has 2 small open areas in that location today which are new. I changed his primary dressing today to silver collagen. As noted he is already had revascularization and does not have any significant superficial venous insufficiency that would be amenable to ablation 10/28/17; patient admitted to the clinic 2 weeks ago. He has a smaller Wound. Scratch injury from last week revealed. There is large wound over the tibial area. This is smaller. Granulation looks healthy. No need for debridement. 11/04/17; the wound on the left lateral calf looks better. Improved dimensions. Surface of this looks better. We've been maintaining him and Kerlix Coban wraps. He finds this much more comfortable. Silver collagen dressing 11/11/17; left lateral Wound continues to look healthy be making  progress. Using a #5 curet I removed removed nonviable skin from the surface of the wound and then necrotic debris from the wound surface. Surface of the wound continues to look healthy. He also has an open area on the left great toenail bed. We've been using topical antibiotics. 11/19/17; left anterior lateral wound continues to look healthy but it's not closed. He also had a small wound above this on the left leg Initially traumatic wounds in the setting of significant chronic venous insufficiency and stasis dermatitis 11/25/17; left anterior wounds superiorly is closed still a small wound inferiorly. 12/02/17; left anterior tibial area. Arrives today with adherent callus. Post debridement clearly not completely closed. Hydrofera Blue under 3 layer compression. 12/09/17; left anterior tibia. Circumferential eschar however the wound bed looks stable to improved. We've been using Hydrofera Blue under 3 layer compression 12/17/17; left anterior tibia. Apparently this was felt to be closed however when the wrap was taken off there is a skin tear to reopen wounds in the same area we've been using Hydrofera Blue under 3 layer compression 12/23/17 left anterior tibia. Not close to close this week apparently the [facility] was stuck to this again. Still circumferential eschar requiring debridement. I put a contact layer on this this time under the Hydrofera Blue 12/31/17; left anterior tibia. Wound is better slight amount of hyper-granulation. Using Hydrofera Blue over Adaptic. 01/07/18; left anterior tibia. The wound had some surface eschar however after this was removed he has no open wound.he was already revascularized by Dr. Jacinto Halim when he came to our clinic with atherectomy of the left SFA and popliteal artery. He was also sent to interventional radiology for venous reflux studies. He was not felt to have significant reflux but certainly has chronic venous changes of his skin with hemosiderin deposition  around this area. He will definitely need to lubricate his skin and wear compression stocking and I've talked to him about this. READMISSION 05/26/2018 This is a now 72 year old man we cared for with traumatic wounds on his left anterior lower extremity. He had been previously revascularized during that admission by Dr. Jacinto Halim. Apparently in follow-up Dr. Jacinto Halim noted that he had deterioration in his arterial status. He underwent a stent placement in the distal left SFA on 04/22/2018. 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 He went back to the angiography suite on 04/30/2018 he underwent PTA and balloon angioplasty of the occluded left mid anterior tibial artery, thrombotic occlusion went from 100 to 0% which reconstitutes the posterior tibial artery. He had thrombectomy and aspiration of the peroneal artery. The stent placed in the distal SFA left SFA was still  occluded. 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 He has been recuperating at [facility] in [facility] after the most recent hospitalization. He is going home tomorrow. He tells me that 2 weeks ago he traumatized the tip of his left fifth toe. He came in urgently for our review of this. This was a history of before I noted that Dr. Jacinto Halim had already noted dry gangrenous changes of the left fifth toe 06/09/2018; 2-week follow-up. I did contact Dr. Jacinto Halim after his last appointment and he apparently saw 1 of Dr. Verl Dicker colleagues the next day. He does not follow-up with Dr. Jacinto Halim himself until Thursday of this week. 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 Nevertheless there is no evidence of infection no drainage and no pain. He had a new area that  this week when we were signing him in today on the left anterior mid tibia area, this is in close proximity to the previous wound we have dealt with in this clinic. 06/23/2018; 2-week follow-up. I did not receive a recent note from Dr. Jacinto Halim to review today. Our office is trying to obtain this. He is apparently not planning to do further vascular interventions and wondered about compression to try and help with the patient's chronic venous insufficiency. However we are also concerned about the arterial flow. He arrives in clinic today with a new area on the left third toe. The areas on the calf/anterior tibia are close to closing. The left fifth toe is still mummified using Betadine. -In reviewing things with the patient he has what sounds like claudication with mild to moderate amount of activity. 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 I prescribed Levaquin over the phone while we attempted to arrange a plan of care. However the patient called yesterday to report he had low-grade fever and he came in today acutely. There is been a marked deterioration in the left third toe with spreading cellulitis up into the dorsal left foot. He was referred to the emergency room. Readmission: 06/29/2020 patient presents today for reevaluation here in our clinic he was previously treated by Dr. Leanord Hawking at the latter part of 2019 in 2 the beginning of 2020. Subsequently we have not seen him since that time in the interim he did have evaluation with vein and vascular specialist specifically Dr. Bo Mcclintock who did perform quite extensive work for a left femoral to anterior tibial artery bypass. With that being said in the interim the patient has developed significant lymphedema and has wounds that he tells me have really never healed in regard to the incision site on the left leg. He also has multiple wounds on the feet for various reasons some of which is that he tends to pick at  his feet. Fortunately there is no signs of active infection systemically at this time he does have some wounds that are little bit deeper but most are fairly superficial he seems to have good blood flow and overall everything appears to be healthy I see no bone exposed and no obvious signs of osteomyelitis. I do not know that he necessarily needs a x-ray at this point although that something we could  consider depending on how things progress. The patient does have a history of lymphedema, diabetes, this is type II, chronic kidney disease stage III, hypertension, and history of peripheral vascular disease. 07/05/2020; patient admitted last week. Is a patient I remember from 2019 he had a spreading infection involving the left foot and we sent him to the hospital. 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 He subsequently had a left femoral to anterior tibial bypass by Dr.Cain vein and vascular. He also has severe lymphedema with chronic skin changes related to that on the left leg. The most problematic area that was new today was on the left medial great toe. This was apparently a small area last week there was purulent drainage which our intake nurse cultured. Also areas on the left medial foot and heel left lateral foot. He has 2 areas on the left medial calf left lateral calf in the setting of the severe lymphedema. AMAURIS, [name] ([id number]) [id number] Page 5 of 21 07/13/2020 on evaluation today patient appears to be doing better in my opinion compared to his last visit. The good news is there is no signs of active infection systemically and locally I do not see any signs of infection either. He did have an x-ray which was negative that is great news he had a culture which showed MRSA but at the same time he is been on the doxycycline which has helped. I do think we may want to extend this for 7 additional days 1/25; patient admitted  to the clinic a few weeks ago. 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 We have been putting him under compression his edema control is a lot better but he is severe verricused skin on the left leg. He is really done quite well he still has an open area on the left medial calf and the left medial first metatarsal head. We have been using silver collagen on the leg silver alginate on the foot 07/27/2020 upon evaluation today patient appears to be doing decently well in regard to his wounds. He still has a lot of dry skin on the left leg. Some of this is starting to peel back and I think he may be able to have them out by removing some that today. Fortunately there is no signs of active infection at this time on the left leg although on the right leg he does appear to have swelling and erythema as well as some mild warmth to touch. This does have been concerned about the possibility of cellulitis although within the differential diagnosis I do think that potentially a DVT has to be at least considered. We need to rule that out before proceeding would just call in the cellulitis. Especially since he is having pain in the posterior aspect of his calf muscle. 2/8; the patient had seen sparingly. He has severe skin changes of chronic lymphedema in the left leg thickened hyperkeratotic verrucous skin. He has an open wound on the medial part of the left first met head left mid tibia. He also has a rim of nonepithelialized skin in the anterior mid tibia. He brought in the AmLactin lotion that was been prescribed although I am not sure under compression and its utility. There concern about cellulitis on the right lower leg the last time he was here. He was put on on antibiotics. His DVT rule out was negative. The right leg looks  fine he is using his stocking on this area 08/10/2020 upon evaluation today patient appears to be doing well with regard to his leg currently.  He has been tolerating the dressing changes without complication. Fortunately there is no signs of active infection which is great news. Overall very pleased with where things stand. 2/22; the patient still has an area on the medial part of the left first met his head. This looks better than when I last saw this earlier this month he has a rim of epithelialization but still some surface debris. Mostly everything on the left leg is healed. There is still a vulnerable in the left mid tibia area. 08/30/2020 upon evaluation today patient appears to be doing much better in regard to his wounds on his foot. 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 Nonetheless I think we will address that today I Minna send in a prescription for him in that regard. Overall though there does not appear to be any signs of significant worsening. 09/07/2020 on evaluation today patient's wounds over his left foot appear to be doing excellent. I do not see any signs of infection there is some callus buildup this can require debridement for certain but overall I feel like he is managing quite nicely. He still using the AmLactin cream which has been beneficial for him as well. 3/22; left foot wound is closed. There is no open area here. He is using ammonium lactate lotion to the lower extremities to help exfoliate dry cracked skin. He has compression stockings from [facility] in [place]. The wound on the medial part of his left first met head is healed today. READMISSION 04/12/2021 Mr. Bendall is a patient we know fairly well he had a prolonged stay in clinic in 2019 with wounds on his left lateral and left anterior lower extremity in the setting of chronic venous insufficiency. More recently he was here earlier this year with predominantly an area on his left foot first metatarsal head plantar  and he says the plantar foot broke down on its not long after we discharged him but he did not come back here. The last few months areas of broken down on his left anterior and again the left lateral lower extremity. The leg itself is very swollen chronically enlarged a lot of hyperkeratotic dry Berry Q skin in the left lower leg. His edema extends well into the thigh. He was seen by Dr. Randie Heinz. He had ABIs on 03/02/2021 showing an ABI on the right of 1 with a TBI of 0.72 his ABI in the left at 1.09 TBI of 0.99. Monophasic and biphasic waveforms on the right. On the left monophasic waveforms were noted he went on to have an angiogram on 03/27/2021 this showed the aortic aortic and iliac segments were free of flow-limiting stenosis the left common femoral vein to evaluate the left femoral to anterior tibial artery bypass was unobstructed the bypass was patent without any areas of stenosis. We discharged the patient in bilateral juxta lite stockings but very clearly that was not sufficient to control the swelling and maintain skin integrity. He is clearly going to need compression pumps. 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 This is fortunate because he is on his feet for long periods of time. 10/27;  patient comes in with our intake nurse reporting copious amount of green drainage from the left anterior mid tibia the left dorsal foot and to a lesser extent the left medial mid tibia. We left the compression wrap on all week for the amount of edema in his left leg is quite a bit better. We use silver alginate as the primary dressing 11/3; edema control is good. Left anterior lower leg left medial lower leg and the plantar first metatarsal head. The left anterior lower leg required debridement. Deep tissue culture I did of this wound showed MRSA I put him on 10 days of doxycycline which she will start today. We have him in compression wraps. He has a  security card and [employer] however he is retiring on November 15. We will need to then get him into a better offloading boot for the left foot perhaps a total contact cast 11/10; edema control is quite good. Left anterior and left medial lower leg wounds in the setting of chronic venous insufficiency and lymphedema. He also has a substantial area over the left plantar first metatarsal head. 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 He has significant hypergranulation on the left plantar foot wound. The patient is a diabetic but he does not have significant PAD 11/17; edema control is quite good. Left anterior and left medial lower leg wounds look better. The really concerning area remains the area on the left plantar first metatarsal head. He has a rim of epithelialization. 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 Starting today with a forefoot off loader but . possibly a total contact cast. 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 Arrives with a very significant odor from I think all of the wounds on the left leg and the left foot. Because of this I did not put a total contact cast on him today but will could still consider this. His wife was having cataract surgery which is the reason he missed the appointment 12/6. I saw this man 5 days ago with a swelling below the popliteal fossa. I thought he actually might have a Baker's cyst however the DVT rule out study that we could arrange right away was negative the technician told me this was not a ruptured Baker's cyst. We attempted to get this aspirated by under ultrasound guidance in interventional radiology however all they did was an  ultrasound however it shows an extensive fluid collection 62 x 8 x 9.4 in the left thigh and left calf. The patient states he thinks this started 8 days ago or so but he really is not complaining of any pain, fever or systemic symptoms. He has not ha 12/20; after some difficulty I managed to get the patient into see Dr. Randie Heinz. Eventually he was taken into the hospital and had a drain put in the fluid collection below his left knee posteriorly extending into the posterior thigh. He still has the drain in place. Culture of this showed moderate staff aureus few Morganella and few Klebsiella he is now on doxycycline and ciprofloxacin as suggested by infectious disease he is on this for a month. The drain will  remain in place until it stops draining 12/29; he comes in today with the 1 wound on his left leg and the area on the left plantar first met head significantly smaller. Both look healthy. He still has the drain in the left leg. He says he has to change this daily. Follows up with Dr. Randie Heinz on January 11. 06/29/2021; the wounds that I am following on the left leg and left first met head continued to be quite healthy. However the area where his inferior drain is in place had copious amounts of drainage which was green in color. The wound here is larger. Follows up with Dr. Pascal Lux of vein and vascular his surgeon next [name], [name] ([id number]) [id number] Page 6 of 21 week as well as infectious disease. He remains on ciprofloxacin and doxycycline. He is not complaining of excessive pain in either one of the drain areas 1/12; the patient saw vascular surgery and infectious disease. Vascular surgery has left the drain in place as there was still some notable drainage still see him back in 2 weeks. Dr. Dorthula Perfect stop the doxycycline and ciprofloxacin and I do not believe he follows up with them at this point. Culture I did last week showed both doxycycline resistant MRSA and Pseudomonas  not sensitive to ciprofloxacin although only in rare titers 1/19; the patient's wound on the left anterior lower leg is just about healed. We have continued healing of the area that was medially on the left leg. Left first plantar metatarsal head continues to get smaller. The major problem here is his 2 drain sites 1 on the left upper calf and lateral thigh. There is purulent drainage still from the left lateral thigh. I gave him antibiotics last week but we still have recultured. He has the drain in the area I think this is eventually going to have to come out. I suspect there will be a connecting wound to heal here perhaps with improved VAc 1/26; the patient had his drain removed by vein and vascular on 1/25/. This was a large pocket of fluid in his left thigh that seem to tunnel into his left upper calf. 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 His mention his Penrose drain was removed today. He now has a tunneling wound on his left calf and left thigh. Both of these probe widely towards each other although I cannot really prove that they connect. Both wounds on his lower leg anteriorly are closed and his area over the first metatarsal head on his right foot continues to improve. We are using Hydrofera Blue here. He also saw infectious disease culture of the abscess they noted was polymicrobial with MRSA, Morganella and Klebsiella he was treated with doxycycline and ciprofloxacin for 4 weeks ending on 07/03/2021. They did not recommend any further antibiotics. Notable that while he still had the Penrose drain in place last week he had purulent drainage coming out of the inferior IandD site this grew [facility], MRSA and Pseudomonas but there does not appear to be any active infection in this area today with the drain out and he is not systemically unwell 2/2; with regards to the drain sites the superior one on the thigh actually is closed down the one on the upper left lateral  calf measures about 8 and half centimeters which is an improvement seems to be less prominent although still with a lot of drainage. The only remaining wound is over the first metatarsal head on the left  foot and this looks to be continuing to improve with Hydrofera Blue. 2/9; the area on his plantar left foot continues to contract. Callus around the wound edge. The drain sites specifically have not come down in depth. We put the wound VAC on Monday he changed the canister late last night our intake nurse reported a pocket of fluid perhaps caused by our compression wraps 2/16; continued improvement in left foot plantar wound. drainage site in the calf is not improved in terms of depth (wound vac) 2/23; continued improvement in the left foot wound over the first metatarsal head. With regards to the drain sites the area on his thigh laterally is healed however the open area on his calf is small in terms of circumference by still probes in by about 15 cm. Within using the wound VAC. Hydrofera Blue on his foot 08/24/2021: The left first metatarsal head wound continues to improve. The wound bed is healthy with just some surrounding callus. Unfortunately the open drain site on his calf remains open and tunnels at least 15 cm (the extent of a Q-tip). This is despite several weeks of wound VAC treatment. Based on reading back through the notes, there has been really no significant change in the depth of the wound, although the orifice is smaller and the more cranial wound on his thigh has closed. I suspect the tunnel tracks nearly all the way to this location. 08/31/2021: Continued improvement in the left first metatarsal head wound. There has been absolutely no improvement to the long tunnel from his open drain site on his calf. We have tried to get him into see vascular surgery sooner to consider the possibility of simply filleting the tract open and allowing it to heal from the bottom up, likely with a wound  VAC. They have not yet scheduled a sooner appointment than his current mid April 09/14/2021: He was seen by vascular surgery and they took him to the operating room last week. They opened a portion of the tunnel, but did not extend the entire length of the known open subcutaneous tract. I read Dr. Darcella Cheshire operative note and it is not clear from that documentation why only a portion of the tract was opened. The heaped up granulation tissue was curetted and removed from at least some portion of the tract. They did place a wound VAC and applied an Unna boot to the leg. The ulcer on his left first metatarsal head is smaller today. The bed looks good and there is just a small amount of surrounding callus. 09/21/2021: The ulcer on his left first metatarsal head looks to be stalled. There is some callus surrounding the wound but the wound bed itself does not appear particularly dynamic. The tunnel tract on his lateral left leg seems to be roughly the same length or perhaps slightly smaller but the wound bed appears healthy with good granulation tissue. He opened up a new wound on his medial thigh and the site of a prior surgical incision. He says that he did this unconsciously in his sleep by scratching. 09/28/2021: Unfortunately, the ulcer on his left first metatarsal head has extended underneath the callus toward the dorsum of his foot. The medial thigh wounds are roughly the same. The tunnel on his lateral left leg continues to be problematic; it is longer than we are able to actually probe with a Q-tip. I am still not certain as to why Dr. Randie Heinz did not open this up entirely when he took the patient to  the operating room. We will likely be back in the same situation with just a small superficial opening in a long unhealed tract, as the open portion is granulating in nicely. 10/02/2021: The patient was initially scheduled for a nurse visit, but we are also applying a total contact cast today. The plantar foot  wound looks clean without significant accumulated callus. We have been applying Prisma silver collagen to the site. 10/05/2021: The patient is here for his first total contact cast change. We have tried using gauze packing strips in the tunnel on his lateral leg wound, but this does not seem to be working any better than the white VAC foam. The foot ulcer looks about the same with minimal periwound callus. Medial thigh wound is clean with just some overlying eschar. 10/12/2021: The plantar foot wound is stable without any significant accumulation of periwound callus. The surface is viable with good granulation tissue. The medial thigh wounds are much smaller and are epithelializing. On the other hand, he had purulent drainage coming from the tunnel on his lateral leg. He does go back to see Dr. Randie Heinz next week and is planning to ask him why the wound tunnel was not completely opened at the time of his most recent operation. 10/19/2021: The plantar foot wound is markedly improved and has epithelial tissue coming through the surface. The medial thigh wounds are nearly closed with just a tiny open area. He did see Dr. Randie Heinz earlier this week and apparently they did discuss the possibility of opening the sinus tract further and enabling a wound VAC application. Apparently there are some limits as to what Dr. 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 I think if we could get the tract open to the level of the popliteal fossa, this would greatly aid in her ability to get this chart closed. That being said, however, today when I probed the tract with a Q-tip, I was not able to insert the entirety of the Q-tip as I have on previous occasions. The tunnel is shorter by about 4 cm. The surface is clean with good granulation tissue and no further episodes of purulent drainage. 10/30/2021: Last week, the patient underwent surgery and had the long tract in his leg opened. There was a  rind that was debrided, according to the operative report. His medial thigh ulcers are closed. The plantar foot wound is clean with a good surface and some built up surrounding callus. 11/06/2021: The overall dimensions of the large wound on his lateral leg remain about the same, but there is good granulation tissue present and the tunneling is a little bit shorter. He has a new wound on his anterior tibial surface, in the same location where he had a similar lesion in the past. The plantar foot wound is clean with some buildup surrounding callus. Just toward the medial aspect of his foot, however, there is an area of darkening that once debrided, revealed another opening in the skin surface. 11/13/2021: The anterior tibial surface wound is closed. The plantar foot wound has some surrounding callus buildup. The area of darkening that I debrided last week and revealed an opening in the skin surface has closed again. The tunnel in the large wound on his lateral leg has come in by about 3 cm. There is healthy granulation tissue on the entire wound surface. 11/23/2021: The patient was out of town last week and did wet-to-dry dressings on his large wound. He says that he rented an  electric scooter/wheelchair and was able to avoid walking for much of his vacation. Unfortunately, he picked open the wound on his left medial thigh. He says that it was itching and he just could not stop scratching it until it was open again. The wound on his plantar foot is smaller and has not accumulated a tremendous amount of callus. The lateral leg wound is shallower and the tunnel has also decreased in depth. There is just a little bit of slough accumulation on the surface. ICHIRO, BAUGHER ([id number]) [id number] Page 7 of 21 11/30/2021: Another portion of his left medial thigh has been opened up. All of these wounds are fairly superficial with just a little bit of slough and eschar accumulation. The  wound on his plantar foot is almost closed with just a bit of eschar and periwound callus accumulation. The lateral leg wound is nearly flush with the surrounding skin and the tunnel is markedly shallower. 12/07/2021: There is just 1 open area on his left medial thigh. It is clean with just a little bit of perimeter eschar. The wound on his plantar foot continues to contract and just has some eschar and periwound callus accumulation. The lateral leg wound is closing at the more distal aspect and the tunnel is smaller. The surface is nearly flush with the surrounding skin and it has a good bed of granulation tissue. 12/14/2021: The thigh and foot wounds are closed. The lateral leg wound has closed over approximately half of its length. The tunnel continues to contract and the surface is now flush with the surrounding skin. The wound bed has robust granulation tissue. 12/22/2021: The thigh and foot wounds have reopened. The foot wound has a lot of callus accumulation around and over it. The thigh wound is tiny with just a little bit of slough in the wound bed. The lateral leg wound continues to contract. His vascular surgeon took the wound VAC off earlier in the week and the patient has been doing wet-to-dry dressings. There is a little slough accumulation on the surface. The tunnel is about 3 cm in depth at this point. 12/28/2021: The thigh wound is closed again. The foot wound has some callus that subsequently has peeled back exposing just a small slit of a wound. The lateral leg wound Is down to about half the size that it originally was and the tunnel is down to about half a centimeter in depth. 01/04/2022: The thigh wound remains closed. The foot wound has heavy callus overlying the wound site. Once this was debrided, the wound was found to be closed. The lateral leg wound is smaller again this week and very superficial. No tunnel could be identified. 01/12/2022: The thigh and foot wounds both remain  closed. The lateral leg wound is now nearly flush with the skin surface. There is good granulation tissue present with a light layer of slough. 01/19/2022: Due to the way his wrap was placed, the patient did not change the dressing on his thigh at all and so the foam was saturated and his skin is macerated. There is a light layer of slough on the wound surface. The underlying granulation tissue is robust and healthy-appearing. He has heavy callus buildup at the site of his first metatarsal head wound which is still healed. 02/01/2022: He has been in silver alginate. When he removed the dressing from his thigh wound, however, some leg, superficially reopening a portion of the wound that had healed. In addition, underneath the callus at his left first  metatarsal head, there appears to be a blister and the wound appears to be open again. 02/08/2022: The lateral leg wound has contracted substantially. There is eschar and a light layer of slough present. He says that it is starting to pull and is uncomfortable. On inspection, there is some puckering of the scar and the eschar is quite dry; this may account for his symptoms. On his first metatarsal head, the wound is much smaller with just some eschar on the surface. The callus has not reaccumulated. He reports that he had a blister come up on his medial thigh wound at the distal aspect. It popped and there is now an opening in his skin again. Looking back through his library of wound photos, there is what looks like a permanent suture just deep to this location and it may be trying to erode through. We have been using silver alginate on his wounds. 02/15/2022: The lateral leg wound is about half the size it was last week. It is clean with just a little perimeter eschar and light slough. The wound on his first metatarsal head is about the same with heavy callus overlying it. The medial thigh wound is closed again. He does have some skin changes on the top of  his foot that looks potentially yeast related. 02/22/2022: The skin on the top of his foot improved with the use of a topical antifungal. The lateral leg wound continues to contract and is again smaller this week. There is a little bit of slough and eschar on the surface. The first metatarsal head wound is a little bit smaller but has reaccumulated a thick callus over the top. He decided to try to trim his toenail and ultimately took the entire nail off of his left great toe. 03/02/2022: His lateral leg wound continues to improve, as does the wound on his left great toe. Unfortunately, it appears that somehow his foot got wet and moisture seeped in through the opening causing his skin to lift. There is a large wound now overlying his first metatarsal on both the plantar, medial, and dorsal portion of his foot. There is necrotic tissue and slough present underneath the shaggy macerated skin. 03/08/2022: The lateral leg wound is smaller again today. There is just a light layer of slough and eschar on the surface. The great toe wound is smaller again today. The first metatarsal wound is a little bit smaller today and does not look nearly as necrotic and macerated. There is still slough and nonviable tissue present. 03/15/2022: The lateral leg wound is narrower and just has a little bit of light slough buildup. The first metatarsal wound still has a fair amount of moisture affecting the periwound skin. The great toe wound is healed. 03/22/2022: The lateral leg wound is now isolated to just at the level of his knee. There is some eschar and slough accumulation. The first metatarsal head wound has epithelialized tremendously and is about half the size that it was last week. He still has some maceration on the top of his foot and a fungal odor is present. 03/29/2022: T oday the patient's foot was macerated, suggesting that the cast got wet. The patient has also been picking at his dry skin and has enlarged  the wound on his left lateral leg. In the time between having his cast removed and my evaluation, he had picked more dry skin and opened up additional wounds on his Achilles area and dorsal foot. The plantar first metatarsal head wound, however,  is smaller and clean with just macerated callus around the perimeter and light slough on the surface. The lateral leg wound measured a little bit larger but is also fairly clean with eschar and minimal slough. 04/02/2022: The patient had vascular studies done last Friday and so his cast was not applied. He is here today to have that done. 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 04/05/2022: Both wounds are smaller and quite clean. There is just a little biofilm on the lateral leg wound. 10/20; the patient has a wound on the left lateral surgical incision at the level of his lateral knee this looks clean and improved. He is using silver alginate. He also has an area on his left medial foot for which she is using Hydrofera Blue under a total contact cast both wounds are measuring smaller 04/20/2022: The plantar foot wound has contracted considerably and is very close to closing. The lateral leg wound was measured a little larger, but there was a tiny open area that was included in the measurements that was not included last week. He has some eschar around the perimeter but otherwise the wound looks clean. 04/27/2022: The lateral leg wound looks better this week. He says that midweek, he felt it was very dry and began applying hydrogel to the site. I think this was beneficial. The foot wound is nearly closed underneath a thick layer of dry skin and callus. 05/04/2022: The foot wound is healed. He has developed a new small ulcer on his anterior tibial surface about midway up his leg. It has a little slough on the surface. The lateral leg wound still is fairly dry, but clean with just a little biofilm on the surface. 05/11/2022: The wound on his foot  reopened on Wednesday. A large blister formed which then broke open revealing the fat layer underneath. The ulcer on his anterior tibial surface is a little bit larger this week. The lateral leg wound has much better moisture balance this week. Fortunately, prior to his foot wound reopening, he did get the cast made for his orthotic. 05/15/2022: Already, the left medial foot wound has improved. The tissue is less macerated and the surface is clean. The ulcer on his anterior tibial surface continues to enlarge. This seems likely secondary to accumulated moisture. The lateral leg wound continues to have an improved moisture balance with the use of collagen. [name], [name] ([id number]) [id number] Page 8 of 21 05/25/2022: The medial foot wound continues to contract. It is now substantially smaller with just a little slough on the surface. The anterior tibial surface wound continues to enlarge further. Once again, this seems to be secondary to moisture. The lateral leg wound does not seem to be changing much in size, but the moisture balance is better. 06/01/2022: The anterior tibial wound is closed. The medial foot wound is down to just a very small, couple of millimeters, opening. The lateral leg wound has good moisture balance, but remains unchanged in size. 12/15; the patient's anterior tibial wound has reopened, however the area on his right first metatarsal head is closed. The major wound is actually on the superior part of his surgical wound in the left lateral thigh. Not a completely viable surface under illumination. This may at some point require a debridement I think he is currently using Prisma. As noted the left medial foot wound has closed 06/14/2022: The anterior tibial wound has closed. The lateral leg wound has a better surface but is basically unchanged in  size. The left medial foot wound has reopened. It looks as though there was some callus accumulation and  moisture got under the callus which caused the tissue to break down again. 06/21/2022: A new wound has opened up just distal to the previous anterior tibial wound. It is small but has hypertrophic granulation tissue present. The lateral leg wound is a little bit narrower and has a layer of slough on the surface. The left medial foot wound is down to just a pinhole. His custom orthotics should be available next week. 06/28/2022: The wound on his first metatarsal head has healed. He has developed a new small wound on his medial lower leg, in an old scar site. The lateral leg wound continues to contract but continues to accumulate slough, as well. 07/03/2022: Despite wearing his custom orthopedic shoes, he managed to reopen the wound on his first metatarsal head. He says he thinks his foot got wet and then some skin lifted up and he peeled this away. Both of the lower leg wounds are smaller and have some dry eschar on the surface. The lateral leg wound is quite a bit narrower today. 07/12/2022: The medial lower leg wound is closed. The anterior lower leg wound has contracted considerably. The lateral upper leg wound is narrower with a layer of slough on the surface. The first metatarsal head wound is also smaller, but had copious drainage which saturated the foam border dressing and resulted in some periwound tissue maceration. Fortunately there was no breakdown at this site. 07/19/2022: The lower leg shows signs of significant maceration; I think he must be sweating excessively inside his cast. There are several areas of skin breakdown present. The wound on his foot is smaller and that on his lateral leg is narrower and is shorter by about a centimeter. 07/26/2022: Last week we used a zinc Coflex wrap prior to applying his total contact cast and this has had the effect of keeping his skin from getting macerated this week. The anterior leg wound has epithelialized substantially. The lateral leg wound is  significantly smaller with just a bit of slough on the surface. The first metatarsal head wound is also smaller this week. 08/02/2022: The anterior leg wound was closed on arrival, but while he was sitting in the room, he picked it open again. The lateral leg wound is smaller with just a little slough on the surface and the first metatarsal head wound has contracted further, as well. 08/09/2022: The first metatarsal head wound is covered with callus. Underneath the callus, it is nearly completely closed. The lateral leg wound is smaller again this week. The anterior leg wound looks better, but he has such heavy buildup of old skin, that moisture is getting underneath this, becoming trapped, and causing the underlying skin to get macerated and open up. 08/16/2022: The first metatarsal head wound is closed. The lateral leg wound continues to contract and is quite a bit smaller again this week. There is just a small, superficial opening remaining on his anterior tibial surface. 08/23/2022: The first metatarsal head wound has, by some miracle, remained closed. The lateral leg wound is substantially smaller with multiple areas of epithelialization. The anterior tibial surface wound is also quite a bit smaller and very clean. 08/30/2022: Unfortunately, his first metatarsal head wound opened up again. It happened in the same fashion as it has on prior occasions. Moisture got under dried skin/callus and created a wound when he removed his sock, taking the skin with it. The  anterior tibial surface has a thick shell of hyperkeratotic skin. This has been contributing to ongoing repeat wounding events as moisture gets underneath this and causes tissue breakdown. 3/15; patient presents for follow-up. His anterior left leg wound has healed. He still has the wound to the left lateral aspect and left first met head. We have been using silver alginate and endoform to these areas under [employer]. He has no issues or  complaints today. He has been taking Augmentin and reports improvement to his symptoms to the left first met head. 09/13/2022: He has accumulated more thick dry skin in sheets on his lower leg. The lateral leg wound is about the same size and the left first metatarsal head wound is a little bit smaller. There is slough on both surfaces. There is callus buildup around the foot wound. 09/20/2022: The lateral leg wound is a little bit narrower and the left first metatarsal head wound also seems to have contracted slightly. There is slough on both surfaces. He has a little skin breakdown on his anterior tibial surface. 09/27/2022: The lateral leg wound continues to contract and is quite clean. The first metatarsal head wound is also smaller. There is some perimeter callus and slough accumulation on the foot. The anterior tibial surface is closed. 10/04/2022: Both of his wounds are smaller today, particularly the first metatarsal head wound. 10/18/2022: He missed his appointment last week and ended up cutting off his wrap on Saturday. The anterior tibial wound reopened. It is fairly superficial with a little bit of slough on the surface. His lateral leg wound is smaller with some slough and eschar buildup. The first metatarsal head wound is also smaller with some callus and slough accumulation. 10/25/2022: All wounds are smaller. There is slough and eschar on the lateral leg and slough and callus on the plantar foot wound. The anterior tibial wound is clean and flush with the surrounding skin. No debris accumulation here. 11/01/2022: The wounds are all smaller again this week. There is slough on the lateral leg and some minimal slough and eschar on the anterior tibial wound. There is callus accumulation on the first metatarsal head site, along with slough. There is also a yeasty odor coming from the foot. 11/08/2022: The lateral leg wound is smaller except where he picked some skin while waiting to be seen. There is  a little bit of slough on the surface. The anterior tibial wound is closed. The first metatarsal head site has gotten macerated once again and has a lot of spongy wet tissue and callus around it. No yeast odor today. 11/15/2022: The lateral leg wound continues to contract. There is now a band of epithelium dividing it into 2 areas. Minimal slough accumulation. He managed to pick open a new wound on his medial lower leg. The first metatarsal head site is smaller with some callus accumulation but no tissue maceration. 11/22/2022: The lateral leg wound is smaller again by about a third today. There is a little bit of slough on the surface with some periwound eschar. The new wound that he picked open last week has healed but he picked open 3 new small wounds on his anterior tibial surface, once again due to his picking at his skin. The first metatarsal head wound looks about the same. There has been some callus accumulation and there is more edema present, as he was not put in a compression wrap last week. 11/29/2022: The lateral leg wound is smaller again today. There is a little  periwound eschar and some slough present. The wounds on his anterior tibial surface are KAILUM, OUIMETTE ([id number]) [id number] Page 9 of 21 all closed except for 1 that has a little bit of slough on the open portion with eschar covering it. The first metatarsal head wound measured a little bit smaller today, but mostly looks about the same. Edema control is better. 12/06/2022: The anterior tibial wound is closed. The lateral leg wound is smaller with some slough and eschar accumulation. The plantar foot ulcer is about the same size, but the tissue does show some evidence of the fact that he was on his feet quite a bit more this past week; there was a death in his family. There has been more callus accumulation and the tissues are little bit more purpleish. 12/13/2022: He picked the skin on his anterior  tibia and reopened a wound there while he was waiting to be seen in clinic. The lateral leg wound is much smaller with a little bit of slough and eschar. The plantar foot ulcer is smaller today with callus and slough buildup, but without the pressure induced tissue injury that was seen last week. 12/20/2022: The anterior tibial wound has healed. The lateral leg wound is smaller again this week. There is a little bit of eschar buildup on the surface. The foot had a fairly strong odor coming from it that persisted even after washing. The wound itself looks like its gotten a little smaller but he has built up thick callus, once again. 12/26/2022: The lateral leg wound is down to just a narrow superficial slit. There is slough and a little bit of dry skin present. The foot is in much better shape today. There is less callus accumulation and no odor. The skin edges are starting to roll inward, however. 01/03/2023: The lateral leg wound is healed. The skin is quite dry but the wound has closed. The foot continues to contract. He has a fair amount of periwound callus accumulation and the surface of the is a little drier than ideal. 01/10/2023: The large lateral leg wound remains closed. He managed to pick off some of his dry skin and has multiple small open superficial wounds on his lower leg. The foot wound measures smaller, but he has a blister immediately adjacent to it. 01/17/2023: The large lateral leg wound remains closed. The small superficial wounds on his lower leg have also closed. The foot wound is about the same and the blister area immediately adjacent to it has dark eschar on the surface. The foot wound looks a bit dry. 01/24/2023: He picked at some dry skin on his lateral leg wound and reopened. The surface is dry and fibrotic. The foot wound is slightly smaller but has periwound callus and the edges are rolling inward again. 01/31/2023: His lateral leg wound is larger again today. It appears that the  Ace bandage may be rubbing and causing some tissue breakdown. His foot wound is also a bit larger and the tissue does not appear particularly healthy. 02/07/2023: The lateral leg wound improved quite significantly over the last week. He has been applying additional gauze over the site before applying the Ace bandage and this seems to have minimized the rubbing. The foot wound is about the same. The culture that I took last week returned with a polymicrobial population of predominantly skin flora. He is currently taking Augmentin as recommended by the culture data. 02/14/2023: The lateral leg wound is covered in a layer of eschar. Underneath, the  wound is nearly healed again. The foot wound looks better today. The depth around the edges has decreased. The tissue has a healthier appearance. He is still taking Augmentin. 02/21/2023: The lateral leg wound is smaller with a layer of eschar on the surface, but is not quite healed. The foot wound is stable but the depth around the edges is almost completely obliterated. There is some callus and senescent skin around the edges. 03/07/2023: The lateral leg wound is down to just a pinhole with a little bit of eschar on the surface. The foot wound demonstrates evidence that he has been walking excessively the past week and there is more callus with moisture underneath it, but the breakdown is limited to the callus layer and there is still intact skin underneath. 03/14/2023: The lateral leg wound is closed. The foot wound looks much improved this week. There is very minimal callus accumulation around the wound edges and the tissue surface appears healthier. It seems the adjustments made to his shoe by the [facility] were effective. 03/28/2023: The lateral leg wound has reopened. The patient reports that he was not moisturizing it as much as he probably should have been and it cracked and some skin peeled away. The foot wound shows signs of pressure-related trauma  with some bruising and increased callus. The patient admits to extensive walking this past week while taking his wife to medical appointments. He does have a knee scooter and it is unclear why he does not use it. 04/04/2023: The lateral leg wound is larger again today. The patient reports that he thinks maybe some dry skin pulled off when he was changing his dressing. It is quite dry and he states that he is using just water to moisten his endoform, rather than the hydrogel that was recommended. The foot wound is the same size, but the tissue is in better condition, without substantial bruising or friction-related trauma. 04/11/2023: Both wounds look better this week. Much of the lateral leg wound has epithelialized. The moisture balance is much better. The foot wound looks better than it has in months. The granulation tissue is flush with the surrounding skin and there is not much callus accumulation. 04/25/2023: The patient was out of town last week. The lateral leg ulcer has good moisture balance, but has opened up a little bit more at the most proximal aspect. The foot ulcer has a layer of callus around the outside but is otherwise fairly clean with minimal slough buildup. 05/02/2023: He has 2 new wounds today. He has picked at the skin on his left anterior tibial surface and opened up the wound location. He picked a callus on his medial foot adjacent to his existing metatarsal head wound and opened up the site there. The lateral leg wound looks a little bit better this week and the moisture balance is excellent. The metatarsal head wound has a little bit of breakdown at the more proximal end but otherwise is also making improvement. 05/09/2023: Both foot ulcers are smaller and look better today. The lower leg ulcer is also smaller. The lateral leg ulcer is basically the same size. All of the wounds have slough on the surface and there is some callus accumulation around the plantar first metatarsal  head wound. 05/16/2023: The smaller of the foot ulcers is nearly healed. The plantar foot ulcer is stable. The lower leg ulcer is smaller. The lateral leg ulcer is a little bit smaller and the moisture balance is good. There is slough on all  of the wound surfaces. 06/13/2023: The small, more dorsal foot wounds have epithelialized. The plantar foot ulcer is stable in size, but the tissue appears healthier. The lower leg ulcer is about the same size. The lateral leg ulcer is larger today. He reports that he had it nearly healed and the skin peeled off again as it got dry. Electronic Signature(s) Signed: 06/13/2023 12:54:48 PM By: [name] MD FACS Entered By: [name] on 06/13/2023 07:50:35 [name] ([id number]) [id number] Page 10 of 21 -------------------------------------------------------------------------------- Physical Exam Details Patient Name: Date of Service: [name], [name] 06/13/2023 10:00 A M Medical Record Number: [id number] Patient Account Number: [id number] Date of Birth/Sex: Treating RN: 12-12-1950 (72 y.o. M) Primary Care Provider: [name] Other Clinician: Referring Provider: Treating Provider/Extender: [name] in Treatment: 113 Constitutional . . . . no acute distress. Respiratory Normal work of breathing on room air.. Notes 06/13/2023: The small, more dorsal foot wounds have epithelialized. The plantar foot ulcer is stable in size, but the tissue appears healthier. The lower leg ulcer is about the same size. The lateral leg ulcer is larger today. Electronic Signature(s) Signed: 06/13/2023 12:54:48 PM By: [name] MD FACS Entered By: [name] on 06/13/2023 07:51:14 -------------------------------------------------------------------------------- Physician Orders Details Patient Name: Date of Service: [name]. 06/13/2023 10:00 A M Medical Record Number:  [id number] Patient Account Number: [id number] Date of Birth/Sex: Treating RN: 1951-05-08 (72 y.o. 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 - Dr. Lady Gary Return A 12/26 @ 10:00 Anesthetic (In clinic) Topical Lidocaine 4% applied to wound bed Bathing/ Shower/ Hygiene May shower and wash wound with soap and water. Edema Control - Orders / Instructions Elevate legs to the level of the heart or above for 30 minutes daily and/or when sitting for 3-4 times a day throughout the day. Avoid standing for long periods of time. Patient to wear own compression stockings every day. Moisturize legs daily. Off-Loading Wound #18R Left,Plantar Metatarsal head first CASSIDY, DIEDRICH ([id number]) [id number] Page 11 of 21 Other: - custom diabetic shoes Additional Orders / Instructions Follow Nutritious Diet - vitamin C 500 mg 3 times a day and zinc 30-50 mg a day Wound Treatment Wound #18R - Metatarsal head first Wound Laterality: Plantar, Left Cleanser: Normal Saline 1 x Per Day/30 Days Discharge  Instructions: Cleanse the wound with Normal Saline prior to applying a clean dressing using gauze sponges, not tissue or cotton balls. Cleanser: Soap and Water 1 x Per Day/30 Days Discharge Instructions: May shower and wash wound with dial antibacterial soap and water prior to dressing change. Cleanser: Wound Cleanser 1 x Per Day/30 Days Discharge Instructions: Cleanse the wound with wound cleanser prior to applying a clean dressing using gauze sponges, not tissue or cotton balls. Prim Dressing: Promogran Prisma Matrix, 4.34 (sq  in) (silver collagen) 1 x Per Day/30 Days ary Discharge Instructions: Moisten collagen with saline or hydrogel Secondary Dressing: Optifoam Non-Adhesive Dressing, 4x4 in (Generic) 1 x Per Day/30 Days Discharge Instructions: Apply over primary dressing as directed. Secondary Dressing: Woven Gauze Sponges 2x2 in (Generic) 1 x Per Day/30 Days Discharge Instructions: Apply over primary dressing as directed. Secured With: 90M Medipore H Soft Cloth Surgical T ape, 4 x 10 (in/yd) 1 x Per Day/30 Days Discharge Instructions: Secure with tape as directed. Wound #22R - Lower Leg Wound Laterality: Left, Lateral, Proximal Cleanser: Soap and Water 1 x Per Day/30 Days Discharge Instructions: May shower and wash wound with dial antibacterial soap and water prior to dressing change. Cleanser: Wound Cleanser 1 x Per Day/30 Days Discharge Instructions: Cleanse the wound with wound cleanser prior to applying a clean dressing using gauze sponges, not tissue or cotton balls. Prim Dressing: Promogran Prisma Matrix, 4.34 (sq in) (silver collagen) 1 x Per Day/30 Days ary Discharge Instructions: Moisten collagen with saline or hydrogel Secondary Dressing: Woven Gauze Sponge, Non-Sterile 4x4 in 1 x Per Day/30 Days Discharge Instructions: Apply over primary dressing as directed. Secured With: 90M Medipore H Soft Cloth Surgical T ape, 4 x 10 (in/yd) 1 x Per Day/30 Days Discharge Instructions:  Secure with tape as directed. Wound #36 - Lower Leg Wound Laterality: Left, Anterior Cleanser: Soap and Water 1 x Per Day/30 Days Discharge Instructions: May shower and wash wound with dial antibacterial soap and water prior to dressing change. Cleanser: Wound Cleanser 1 x Per Day/30 Days Discharge Instructions: Cleanse the wound with wound cleanser prior to applying a clean dressing using gauze sponges, not tissue or cotton balls. Prim Dressing: Maxorb Extra Ag+ Alginate Dressing, 2x2 (in/in) 1 x Per Day/30 Days ary Discharge Instructions: Apply to wound bed as instructed Secondary Dressing: Zetuvit Plus Silicone Border Dressing 4x4 (in/in) 1 x Per Day/30 Days Discharge Instructions: Apply silicone border over primary dressing as directed. Electronic Signature(s) Signed: 06/13/2023 12:54:48 PM By: [name] MD FACS Signed: 06/14/2023 12:52:17 PM By: Zenaida Deed RN, BSN Entered By: Zenaida Deed on 06/13/2023 07:53:36 -------------------------------------------------------------------------------- Problem List Details Patient Name: Date of Service: [name]. 06/13/2023 10:00 A Sallye Ober ([id number]) [id number] Page 12 of 21 Medical Record Number: [id number] Patient Account Number: [id number] Date of Birth/Sex: Treating RN: 03-03-1951 (72 y.o. 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 03/28/2023 No Yes L97.828 Non-pressure chronic ulcer of other part of left lower leg with other specified 05/02/2023 No Yes severity I89.0 Lymphedema, not elsewhere classified  04/12/2021 No Yes I87.322 Chronic venous hypertension (idiopathic) with inflammation of left lower 04/12/2021 No Yes extremity L85.9 Epidermal thickening, unspecified 08/30/2022 No Yes E11.51 Type 2 diabetes mellitus with diabetic peripheral angiopathy without gangrene 04/12/2021 No Yes Inactive Problems ICD-10 Code Description Active Date Inactive Date E11.621 Type 2 diabetes mellitus with foot ulcer 04/12/2021 04/12/2021 E11.42 Type 2 diabetes mellitus with diabetic polyneuropathy 04/12/2021 04/12/2021 L02.416 Cutaneous abscess of left lower limb 06/13/2021 06/13/2021 Resolved Problems ICD-10 Code Description Active Date Resolved Date L97.828 Non-pressure chronic ulcer of other part of left lower leg with other specified severity 01/10/2023 06/08/2022 Electronic Signature(s) Signed: 06/13/2023 12:54:48 PM By: [name]  MD FACS Entered By: [name] on 06/13/2023 07:31:39 [name] ([id number]) [id number] Page 13 of 21 -------------------------------------------------------------------------------- Progress Note Details Patient Name: Date of Service: [name], [name] 06/13/2023 10:00 A M Medical Record Number: [id number] Patient Account Number: [id number] Date of Birth/Sex: Treating RN: 1951/01/05 (72 y.o. M) Primary Care Provider: [name] Other Clinician: Referring Provider: Treating Provider/Extender: [name] in Treatment: 113 Subjective Chief Complaint Information obtained from Patient Left leg and foot ulcers 04/12/2021; patient is here for wounds on his left lower leg and left plantar foot over the first metatarsal head History of Present Illness (HPI) 10/11/17; Mr. Dufek is a 72 year old man who tells me that in 2015 he slipped down the latter traumatizing his left leg. He developed a wound in the same spot the area that we are currently looking at. He states this closed over for the most part  although he always felt it was somewhat unstable. In 2016 he hit the same area with the door of his car had this reopened. He tells me that this is never really closed although sometimes an inflow it remains open on a constant basis. He has not been using any specific dressing to this except for topical antibiotics the nature of which were not really sure. His primary doctor did send him to see Dr. Jacinto Halim of interventional cardiology. He underwent an angiogram on 08/06/17 and he underwent a PTA and directional atherectomy of the lesser distal SFA and popliteal arteries which resulted in brisk improvement in blood flow. It was noted that he had 2 vessel runoff through the anterior tibial and peroneal. He is also been to see vascular and interventional radiologist. He was not felt to have any significant superficial venous insufficiency. Presumably is not a candidate for any ablation. It was suggested he come here for wound care. The patient is a type II diabetic on insulin. He also has a history of venous insufficiency. ABIs on the left were noncompressible in our clinic 10/21/17; patient we admitted to the clinic last week. 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 We put Iodosorb on him after an aggressive debridement and 3 layer compression. He complained of pain in his ankle and itching with is skin in fact he scratched the area on the medial calf superiorly at the rim of our wraps and he has 2 small open areas in that location today which are new. I changed his primary dressing today to silver collagen. As noted he is already had revascularization and does not have any significant superficial venous insufficiency that would be amenable to ablation 10/28/17; patient admitted to the clinic 2 weeks ago. He has a smaller Wound. Scratch injury from last week revealed. There is large wound over the tibial area. This is smaller. Granulation  looks healthy. No need for debridement. 11/04/17; the wound on the left lateral calf looks better. Improved dimensions. Surface of this looks better. We've been maintaining him and Kerlix Coban wraps. He finds this much more comfortable. Silver collagen dressing 11/11/17; left lateral Wound continues to look healthy be making progress. Using a #5 curet I removed removed nonviable skin from the surface of the wound and then necrotic debris from the wound surface. Surface of the wound continues to look healthy. He also has an open area on the left great toenail bed. We've been using topical antibiotics. 11/19/17; left anterior lateral wound continues to look healthy  but it's not closed. He also had a small wound above this on the left leg Initially traumatic wounds in the setting of significant chronic venous insufficiency and stasis dermatitis 11/25/17; left anterior wounds superiorly is closed still a small wound inferiorly. 12/02/17; left anterior tibial area. Arrives today with adherent callus. Post debridement clearly not completely closed. Hydrofera Blue under 3 layer compression. 12/09/17; left anterior tibia. Circumferential eschar however the wound bed looks stable to improved. We've been using Hydrofera Blue under 3 layer compression 12/17/17; left anterior tibia. Apparently this was felt to be closed however when the wrap was taken off there is a skin tear to reopen wounds in the same area we've been using Hydrofera Blue under 3 layer compression 12/23/17 left anterior tibia. Not close to close this week apparently the [facility] was stuck to this again. Still circumferential eschar requiring debridement. I put a contact layer on this this time under the Hydrofera Blue 12/31/17; left anterior tibia. Wound is better slight amount of hyper-granulation. Using Hydrofera Blue over Adaptic. 01/07/18; left anterior tibia. The wound had some surface eschar however after this was removed he has no open  wound.he was already revascularized by Dr. Jacinto Halim when he came to our clinic with atherectomy of the left SFA and popliteal artery. He was also sent to interventional radiology for venous reflux studies. He was not felt to have significant reflux but certainly has chronic venous changes of his skin with hemosiderin deposition around this area. He will definitely need to lubricate his skin and wear compression stocking and I've talked to him about this. READMISSION 05/26/2018 This is a now 72 year old man we cared for with traumatic wounds on his left anterior lower extremity. He had been previously revascularized during that admission by Dr. Jacinto Halim. Apparently in follow-up Dr. Jacinto Halim noted that he had deterioration in his arterial status. He underwent a stent placement in the distal left SFA on 04/22/2018. 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 He went back to the angiography suite on 04/30/2018 he underwent PTA and balloon angioplasty of the occluded left mid anterior tibial artery, thrombotic occlusion went from 100 to 0% which reconstitutes the posterior tibial artery. He had thrombectomy and aspiration of the peroneal artery. The stent placed in the distal SFA left SFA was still occluded. 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 He has been recuperating at [facility] in [facility] after the most recent hospitalization. He is going home tomorrow. He tells me that 2 weeks ago he traumatized the tip of his left fifth toe. He came in urgently for our review of this. This was a history of before I noted that Dr. Jacinto Halim had already noted dry gangrenous changes of the left fifth toe 06/09/2018; 2-week follow-up. I did contact Dr.  Jacinto Halim after his last appointment and he apparently saw 1 of Dr. Verl Dicker colleagues the next day. He does not follow-up with Dr. Jacinto Halim himself until Thursday of this week. 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 Nevertheless there is no evidence of infection no drainage and no pain. He had a new area that this week when we were signing him in today on the left anterior mid tibia  area, this is in close proximity to the previous wound we have dealt with in this clinic. 06/23/2018; 2-week follow-up. I did not receive a recent note from Dr. Jacinto Halim to review today. Our office is trying to obtain this. He is apparently not planning to do further vascular interventions and wondered about compression to try and help with the patient's chronic venous insufficiency. However we are also concerned about the arterial flow. He arrives in clinic today with a new area on the left third toe. The areas on the calf/anterior tibia are close to closing. The left fifth toe is still mummified [name], [name] ([id number]) [id number] Page 14 of 21 using Betadine. -In reviewing things with the patient he has what sounds like claudication with mild to moderate amount of activity. 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 I prescribed Levaquin over the phone while we attempted to arrange a plan of care. However the patient called yesterday to report he had low-grade fever and he came in today acutely. There is been a marked deterioration in the left third toe with spreading cellulitis up into the dorsal left foot. He was referred to the emergency room. Readmission: 06/29/2020 patient presents today for reevaluation here in our clinic he was previously treated by Dr. Leanord Hawking at the latter part of 2019 in 2 the beginning of 2020. Subsequently we have not seen him since that time in the interim he did have evaluation with vein and vascular specialist  specifically Dr. Bo Mcclintock who did perform quite extensive work for a left femoral to anterior tibial artery bypass. With that being said in the interim the patient has developed significant lymphedema and has wounds that he tells me have really never healed in regard to the incision site on the left leg. He also has multiple wounds on the feet for various reasons some of which is that he tends to pick at his feet. Fortunately there is no signs of active infection systemically at this time he does have some wounds that are little bit deeper but most are fairly superficial he seems to have good blood flow and overall everything appears to be healthy I see no bone exposed and no obvious signs of osteomyelitis. I do not know that he necessarily needs a x-ray at this point although that something we could consider depending on how things progress. The patient does have a history of lymphedema, diabetes, this is type II, chronic kidney disease stage III, hypertension, and history of peripheral vascular disease. 07/05/2020; patient admitted last week. Is a patient I remember from 2019 he had a spreading infection involving the left foot and we sent him to the hospital. 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 He subsequently had a left femoral to anterior tibial bypass by Dr.Cain vein and vascular. He also has severe lymphedema with chronic skin changes related to that on the left leg. The most problematic area that was new today was on the left medial great toe. This was apparently a small area last week there was purulent drainage which our intake nurse cultured. Also areas on the left medial foot and heel left lateral foot. He has 2 areas on the left medial calf left lateral calf in the setting of the severe lymphedema. 07/13/2020 on evaluation today patient appears to be doing better in my opinion compared to his last visit. The good news is there is no signs of  active infection systemically and locally I do not see any signs of infection either. He did have an x-ray which was negative that is great news he had a culture which showed MRSA but at the same time he is been on the doxycycline which has helped. I do think we may want to extend this for 7 additional days 1/25; patient admitted to the clinic a few weeks ago. 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 We have been putting him under compression his edema control is a lot better but he is severe verricused skin on the left leg. He is really done quite well he still has an open area on the left medial calf and the left medial first metatarsal head. We have been using silver collagen on the leg silver alginate on the foot 07/27/2020 upon evaluation today patient appears to be doing decently well in regard to his wounds. He still has a lot of dry skin on the left leg. Some of this is starting to peel back and I think he may be able to have them out by removing some that today. Fortunately there is no signs of active infection at this time on the left leg although on the right leg he does appear to have swelling and erythema as well as some mild warmth to touch. This does have been concerned about the possibility of cellulitis although within the differential diagnosis I do think that potentially a DVT has to be at least considered. We need to rule that out before proceeding would just call in the cellulitis. Especially since he is having pain in the posterior aspect of his calf muscle. 2/8; the patient had seen sparingly. He has severe skin changes of chronic lymphedema in the left leg thickened hyperkeratotic verrucous skin. He has an open wound on the medial part of the left first met head left mid tibia. He also has a rim of nonepithelialized skin in the anterior mid tibia. He brought in the AmLactin lotion that was been prescribed although I am not sure under  compression and its utility. There concern about cellulitis on the right lower leg the last time he was here. He was put on on antibiotics. His DVT rule out was negative. The right leg looks fine he is using his stocking on this area 08/10/2020 upon evaluation today patient appears to be doing well with regard to his leg currently. He has been tolerating the dressing changes without complication. Fortunately there is no signs of active infection which is great news. Overall very pleased with where things stand. 2/22; the patient still has an area on the medial part of the left first met his head. This looks better than when I last saw this earlier this month he has a rim of epithelialization but still some surface debris. Mostly everything on the left leg is healed. There is still a vulnerable in the left mid tibia area. 08/30/2020 upon evaluation today patient appears to be doing much better in regard to his wounds on his foot. 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 Nonetheless I think we will address that today I Minna send in a prescription for him in that regard. Overall though there does not appear to be any signs of significant worsening. 09/07/2020 on evaluation today patient's wounds over his left foot  appear to be doing excellent. I do not see any signs of infection there is some callus buildup this can require debridement for certain but overall I feel like he is managing quite nicely. He still using the AmLactin cream which has been beneficial for him as well. 3/22; left foot wound is closed. There is no open area here. He is using ammonium lactate lotion to the lower extremities to help exfoliate dry cracked skin. He has compression stockings from elastic therapy in [place]. The wound on the medial part of his left first met head is healed  today. READMISSION 04/12/2021 Mr. Baysinger is a patient we know fairly well he had a prolonged stay in clinic in 2019 with wounds on his left lateral and left anterior lower extremity in the setting of chronic venous insufficiency. More recently he was here earlier this year with predominantly an area on his left foot first metatarsal head plantar and he says the plantar foot broke down on its not long after we discharged him but he did not come back here. The last few months areas of broken down on his left anterior and again the left lateral lower extremity. The leg itself is very swollen chronically enlarged a lot of hyperkeratotic dry Berry Q skin in the left lower leg. His edema extends well into the thigh. He was seen by Dr. Randie Heinz. He had ABIs on 03/02/2021 showing an ABI on the right of 1 with a TBI of 0.72 his ABI in the left at 1.09 TBI of 0.99. Monophasic and biphasic waveforms on the right. On the left monophasic waveforms were noted he went on to have an angiogram on 03/27/2021 this showed the aortic aortic and iliac segments were free of flow-limiting stenosis the left common femoral vein to evaluate the left femoral to anterior tibial artery bypass was unobstructed the bypass was patent without any areas of stenosis. We discharged the patient in bilateral juxta lite stockings but very clearly that was not sufficient to control the swelling and maintain skin integrity. He is clearly going to need compression pumps. 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 This is fortunate because he is on his feet for long periods of time. 10/27; patient comes in with our intake nurse reporting copious amount of green drainage from the left anterior mid tibia the left dorsal foot and to a lesser extent the left medial mid tibia. We left the compression wrap on all week for the amount of edema in his left leg is quite a bit better. We use silver alginate  as the primary dressing 11/3; edema control is good. Left anterior lower leg left medial lower leg and the plantar first metatarsal head. The left anterior lower leg required debridement. Deep tissue culture I did of this wound showed MRSA I put him on 10 days of doxycycline which she will start today. We have him in compression wraps. He has a security card and [employer] however he is retiring on November 15. We will need to then get him into a better offloading boot for the left foot perhaps a total contact cast 11/10; edema control is quite good. Left anterior and left medial lower leg wounds in the setting of chronic venous insufficiency and lymphedema. He also has a substantial area over the left plantar first metatarsal head. I treated him for MRSA that we identified on the major wound on the left anterior  mid tibia with doxycycline and gentamicin topically. He has significant hypergranulation on the left plantar foot wound. The patient is a diabetic but he does not have significant PAD 11/17; edema control is quite good. Left anterior and left medial lower leg wounds look better. The really concerning area remains the area on the left plantar TRAJEN, SOWLE ([id number]) [id number] Page 15 of 21 first metatarsal head. He has a rim of epithelialization. 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 Starting today with a forefoot off loader but . possibly a total contact cast. 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 Arrives with a very significant odor from I think all of the wounds on the left leg and the left foot. Because of this I did not put a total contact cast on him today but will could still consider this. His wife was having cataract surgery  which is the reason he missed the appointment 12/6. I saw this man 5 days ago with a swelling below the popliteal fossa. I thought he actually might have a Baker's cyst however the DVT rule out study that we could arrange right away was negative the technician told me this was not a ruptured Baker's cyst. We attempted to get this aspirated by under ultrasound guidance in interventional radiology however all they did was an ultrasound however it shows an extensive fluid collection 62 x 8 x 9.4 in the left thigh and left calf. The patient states he thinks this started 8 days ago or so but he really is not complaining of any pain, fever or systemic symptoms. He has not ha 12/20; after some difficulty I managed to get the patient into see Dr. Randie Heinz. Eventually he was taken into the hospital and had a drain put in the fluid collection below his left knee posteriorly extending into the posterior thigh. He still has the drain in place. Culture of this showed moderate staff aureus few Morganella and few Klebsiella he is now on doxycycline and ciprofloxacin as suggested by infectious disease he is on this for a month. The drain will remain in place until it stops draining 12/29; he comes in today with the 1 wound on his left leg and the area on the left plantar first met head significantly smaller. Both look healthy. He still has the drain in the left leg. He says he has to change this daily. Follows up with Dr. Randie Heinz on January 11. 06/29/2021; the wounds that I am following on the left leg and left first met head continued to be quite healthy. However the area where his inferior drain is in place had copious amounts of drainage which was green in color. The wound here is larger. Follows up with Dr. Pascal Lux of vein and vascular his surgeon next week as well as infectious disease. He remains on ciprofloxacin and doxycycline. He is not complaining of excessive pain in either one of the drain areas 1/12; the patient saw  vascular surgery and infectious disease. Vascular surgery has left the drain in place as there was still some notable drainage still see him back in 2 weeks. Dr. Dorthula Perfect stop the doxycycline and ciprofloxacin and I do not believe he follows up with them at this point. Culture  I did last week showed both doxycycline resistant MRSA and Pseudomonas not sensitive to ciprofloxacin although only in rare titers 1/19; the patient's wound on the left anterior lower leg is just about healed. We have continued healing of the area that was medially on the left leg. Left first plantar metatarsal head continues to get smaller. The major problem here is his 2 drain sites 1 on the left upper calf and lateral thigh. There is purulent drainage still from the left lateral thigh. I gave him antibiotics last week but we still have recultured. He has the drain in the area I think this is eventually going to have to come out. I suspect there will be a connecting wound to heal here perhaps with improved VAc 1/26; the patient had his drain removed by vein and vascular on 1/25/. This was a large pocket of fluid in his left thigh that seem to tunnel into his left upper calf. 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 His mention his Penrose drain was removed today. He now has a tunneling wound on his left calf and left thigh. Both of these probe widely towards each other although I cannot really prove that they connect. Both wounds on his lower leg anteriorly are closed and his area over the first metatarsal head on his right foot continues to improve. We are using Hydrofera Blue here. He also saw infectious disease culture of the abscess they noted was polymicrobial with MRSA, Morganella and Klebsiella he was treated with doxycycline and ciprofloxacin for 4 weeks ending on 07/03/2021. They did not recommend any further antibiotics. Notable that while he still had the Penrose drain in place last week he had  purulent drainage coming out of the inferior IandD site this grew [facility], MRSA and Pseudomonas but there does not appear to be any active infection in this area today with the drain out and he is not systemically unwell 2/2; with regards to the drain sites the superior one on the thigh actually is closed down the one on the upper left lateral calf measures about 8 and half centimeters which is an improvement seems to be less prominent although still with a lot of drainage. The only remaining wound is over the first metatarsal head on the left foot and this looks to be continuing to improve with Hydrofera Blue. 2/9; the area on his plantar left foot continues to contract. Callus around the wound edge. The drain sites specifically have not come down in depth. We put the wound VAC on Monday he changed the canister late last night our intake nurse reported a pocket of fluid perhaps caused by our compression wraps 2/16; continued improvement in left foot plantar wound. drainage site in the calf is not improved in terms of depth (wound vac) 2/23; continued improvement in the left foot wound over the first metatarsal head. With regards to the drain sites the area on his thigh laterally is healed however the open area on his calf is small in terms of circumference by still probes in by about 15 cm. Within using the wound VAC. Hydrofera Blue on his foot 08/24/2021: The left first metatarsal head wound continues to improve. The wound bed is healthy with just some surrounding callus. Unfortunately the open drain site on his calf remains open and tunnels at least 15 cm (the extent of a Q-tip). This is despite several weeks of wound VAC treatment. Based on reading back through the notes, there has been  really no significant change in the depth of the wound, although the orifice is smaller and the more cranial wound on his thigh has closed. I suspect the tunnel tracks nearly all the way to this  location. 08/31/2021: Continued improvement in the left first metatarsal head wound. There has been absolutely no improvement to the long tunnel from his open drain site on his calf. We have tried to get him into see vascular surgery sooner to consider the possibility of simply filleting the tract open and allowing it to heal from the bottom up, likely with a wound VAC. They have not yet scheduled a sooner appointment than his current mid April 09/14/2021: He was seen by vascular surgery and they took him to the operating room last week. They opened a portion of the tunnel, but did not extend the entire length of the known open subcutaneous tract. I read Dr. Darcella Cheshire operative note and it is not clear from that documentation why only a portion of the tract was opened. The heaped up granulation tissue was curetted and removed from at least some portion of the tract. They did place a wound VAC and applied an Unna boot to the leg. The ulcer on his left first metatarsal head is smaller today. The bed looks good and there is just a small amount of surrounding callus. 09/21/2021: The ulcer on his left first metatarsal head looks to be stalled. There is some callus surrounding the wound but the wound bed itself does not appear particularly dynamic. The tunnel tract on his lateral left leg seems to be roughly the same length or perhaps slightly smaller but the wound bed appears healthy with good granulation tissue. He opened up a new wound on his medial thigh and the site of a prior surgical incision. He says that he did this unconsciously in his sleep by scratching. 09/28/2021: Unfortunately, the ulcer on his left first metatarsal head has extended underneath the callus toward the dorsum of his foot. The medial thigh wounds are roughly the same. The tunnel on his lateral left leg continues to be problematic; it is longer than we are able to actually probe with a Q-tip. I am still not certain as to why Dr. Randie Heinz did  not open this up entirely when he took the patient to the operating room. We will likely be back in the same situation with just a small superficial opening in a long unhealed tract, as the open portion is granulating in nicely. 10/02/2021: The patient was initially scheduled for a nurse visit, but we are also applying a total contact cast today. The plantar foot wound looks clean without significant accumulated callus. We have been applying Prisma silver collagen to the site. 10/05/2021: The patient is here for his first total contact cast change. We have tried using gauze packing strips in the tunnel on his lateral leg wound, but this does not seem to be working any better than the white VAC foam. The foot ulcer looks about the same with minimal periwound callus. Medial thigh wound is clean with just some overlying eschar. 10/12/2021: The plantar foot wound is stable without any significant accumulation of periwound callus. The surface is viable with good granulation tissue. The OVIDE, CALANDRA ([id number]) [id number] Page 16 of 21 medial thigh wounds are much smaller and are epithelializing. On the other hand, he had purulent drainage coming from the tunnel on his lateral leg. He does go back to see Dr. Randie Heinz next week and is planning to  ask him why the wound tunnel was not completely opened at the time of his most recent operation. 10/19/2021: The plantar foot wound is markedly improved and has epithelial tissue coming through the surface. The medial thigh wounds are nearly closed with just a tiny open area. He did see Dr. Randie Heinz earlier this week and apparently they did discuss the possibility of opening the sinus tract further and enabling a wound VAC application. Apparently there are some limits as to what Dr. 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 I think if we could get the tract open to the level of the popliteal fossa, this would  greatly aid in her ability to get this chart closed. That being said, however, today when I probed the tract with a Q-tip, I was not able to insert the entirety of the Q-tip as I have on previous occasions. The tunnel is shorter by about 4 cm. The surface is clean with good granulation tissue and no further episodes of purulent drainage. 10/30/2021: Last week, the patient underwent surgery and had the long tract in his leg opened. There was a rind that was debrided, according to the operative report. His medial thigh ulcers are closed. The plantar foot wound is clean with a good surface and some built up surrounding callus. 11/06/2021: The overall dimensions of the large wound on his lateral leg remain about the same, but there is good granulation tissue present and the tunneling is a little bit shorter. He has a new wound on his anterior tibial surface, in the same location where he had a similar lesion in the past. The plantar foot wound is clean with some buildup surrounding callus. Just toward the medial aspect of his foot, however, there is an area of darkening that once debrided, revealed another opening in the skin surface. 11/13/2021: The anterior tibial surface wound is closed. The plantar foot wound has some surrounding callus buildup. The area of darkening that I debrided last week and revealed an opening in the skin surface has closed again. The tunnel in the large wound on his lateral leg has come in by about 3 cm. There is healthy granulation tissue on the entire wound surface. 11/23/2021: The patient was out of town last week and did wet-to-dry dressings on his large wound. 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 Unfortunately, he picked open the wound on his left medial thigh. He says that it was itching and he just could not stop scratching it until it was open again. The wound on his plantar foot is smaller and has not  accumulated a tremendous amount of callus. The lateral leg wound is shallower and the tunnel has also decreased in depth. There is just a little bit of slough accumulation on the surface. 11/30/2021: Another portion of his left medial thigh has been opened up. All of these wounds are fairly superficial with just a little bit of slough and eschar accumulation. The wound on his plantar foot is almost closed with just a bit of eschar and periwound callus accumulation. The lateral leg wound is nearly flush with the surrounding skin and the tunnel is markedly shallower. 12/07/2021: There is just 1 open area on his left medial thigh. It is clean with just a little bit of perimeter eschar. The wound on his plantar foot continues to contract and just has some eschar and periwound callus accumulation. The lateral leg wound  is closing at the more distal aspect and the tunnel is smaller. The surface is nearly flush with the surrounding skin and it has a good bed of granulation tissue. 12/14/2021: The thigh and foot wounds are closed. The lateral leg wound has closed over approximately half of its length. The tunnel continues to contract and the surface is now flush with the surrounding skin. The wound bed has robust granulation tissue. 12/22/2021: The thigh and foot wounds have reopened. The foot wound has a lot of callus accumulation around and over it. The thigh wound is tiny with just a little bit of slough in the wound bed. The lateral leg wound continues to contract. His vascular surgeon took the wound VAC off earlier in the week and the patient has been doing wet-to-dry dressings. There is a little slough accumulation on the surface. The tunnel is about 3 cm in depth at this point. 12/28/2021: The thigh wound is closed again. The foot wound has some callus that subsequently has peeled back exposing just a small slit of a wound. The lateral leg wound Is down to about half the size that it originally was and the  tunnel is down to about half a centimeter in depth. 01/04/2022: The thigh wound remains closed. The foot wound has heavy callus overlying the wound site. Once this was debrided, the wound was found to be closed. The lateral leg wound is smaller again this week and very superficial. No tunnel could be identified. 01/12/2022: The thigh and foot wounds both remain closed. The lateral leg wound is now nearly flush with the skin surface. There is good granulation tissue present with a light layer of slough. 01/19/2022: Due to the way his wrap was placed, the patient did not change the dressing on his thigh at all and so the foam was saturated and his skin is macerated. There is a light layer of slough on the wound surface. The underlying granulation tissue is robust and healthy-appearing. He has heavy callus buildup at the site of his first metatarsal head wound which is still healed. 02/01/2022: He has been in silver alginate. When he removed the dressing from his thigh wound, however, some leg, superficially reopening a portion of the wound that had healed. In addition, underneath the callus at his left first metatarsal head, there appears to be a blister and the wound appears to be open again. 02/08/2022: The lateral leg wound has contracted substantially. There is eschar and a light layer of slough present. He says that it is starting to pull and is uncomfortable. On inspection, there is some puckering of the scar and the eschar is quite dry; this may account for his symptoms. On his first metatarsal head, the wound is much smaller with just some eschar on the surface. The callus has not reaccumulated. He reports that he had a blister come up on his medial thigh wound at the distal aspect. It popped and there is now an opening in his skin again. Looking back through his library of wound photos, there is what looks like a permanent suture just deep to this location and it may be trying to erode through. We  have been using silver alginate on his wounds. 02/15/2022: The lateral leg wound is about half the size it was last week. It is clean with just a little perimeter eschar and light slough. The wound on his first metatarsal head is about the same with heavy callus overlying it. The medial thigh wound is closed again.  He does have some skin changes on the top of his foot that looks potentially yeast related. 02/22/2022: The skin on the top of his foot improved with the use of a topical antifungal. The lateral leg wound continues to contract and is again smaller this week. There is a little bit of slough and eschar on the surface. The first metatarsal head wound is a little bit smaller but has reaccumulated a thick callus over the top. He decided to try to trim his toenail and ultimately took the entire nail off of his left great toe. 03/02/2022: His lateral leg wound continues to improve, as does the wound on his left great toe. Unfortunately, it appears that somehow his foot got wet and moisture seeped in through the opening causing his skin to lift. There is a large wound now overlying his first metatarsal on both the plantar, medial, and dorsal portion of his foot. There is necrotic tissue and slough present underneath the shaggy macerated skin. 03/08/2022: The lateral leg wound is smaller again today. There is just a light layer of slough and eschar on the surface. The great toe wound is smaller again today. The first metatarsal wound is a little bit smaller today and does not look nearly as necrotic and macerated. There is still slough and nonviable tissue present. 03/15/2022: The lateral leg wound is narrower and just has a little bit of light slough buildup. The first metatarsal wound still has a fair amount of moisture affecting the periwound skin. The great toe wound is healed. 03/22/2022: The lateral leg wound is now isolated to just at the level of his knee. There is some eschar and slough  accumulation. The first metatarsal head wound has epithelialized tremendously and is about half the size that it was last week. He still has some maceration on the top of his foot and a fungal odor is present. 03/29/2022: T oday the patient's foot was macerated, suggesting that the cast got wet. The patient has also been picking at his dry skin and has enlarged the wound on his left lateral leg. In the time between having his cast removed and my evaluation, he had picked more dry skin and opened up additional wounds on his Achilles area and dorsal foot. The plantar first metatarsal head wound, however, is smaller and clean with just macerated callus around the perimeter and light slough on the surface. The lateral leg wound measured a little bit larger but is also fairly clean with eschar and minimal slough. BRAEDAN, VARIN ([id number]) [id number] Page 17 of 21 04/02/2022: The patient had vascular studies done last Friday and so his cast was not applied. He is here today to have that done. 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 04/05/2022: Both wounds are smaller and quite clean. There is just a little biofilm on the lateral leg wound. 10/20; the patient has a wound on the left lateral surgical incision at the level of his lateral knee this looks clean and improved. He is using silver alginate. He also has an area on his left medial foot for which she is using Hydrofera Blue under a total contact cast both wounds are measuring smaller 04/20/2022: The plantar foot wound has contracted considerably and is very close to closing. The lateral leg wound was measured a little larger, but there was a tiny open area that was included in the measurements that was not included last week. He has some eschar around the perimeter but otherwise the wound  looks clean. 04/27/2022: The lateral leg wound looks better this week. He says that midweek, he felt it was very dry and  began applying hydrogel to the site. I think this was beneficial. The foot wound is nearly closed underneath a thick layer of dry skin and callus. 05/04/2022: The foot wound is healed. He has developed a new small ulcer on his anterior tibial surface about midway up his leg. It has a little slough on the surface. The lateral leg wound still is fairly dry, but clean with just a little biofilm on the surface. 05/11/2022: The wound on his foot reopened on Wednesday. A large blister formed which then broke open revealing the fat layer underneath. The ulcer on his anterior tibial surface is a little bit larger this week. The lateral leg wound has much better moisture balance this week. Fortunately, prior to his foot wound reopening, he did get the cast made for his orthotic. 05/15/2022: Already, the left medial foot wound has improved. The tissue is less macerated and the surface is clean. The ulcer on his anterior tibial surface continues to enlarge. This seems likely secondary to accumulated moisture. The lateral leg wound continues to have an improved moisture balance with the use of collagen. 05/25/2022: The medial foot wound continues to contract. It is now substantially smaller with just a little slough on the surface. The anterior tibial surface wound continues to enlarge further. Once again, this seems to be secondary to moisture. The lateral leg wound does not seem to be changing much in size, but the moisture balance is better. 06/01/2022: The anterior tibial wound is closed. The medial foot wound is down to just a very small, couple of millimeters, opening. The lateral leg wound has good moisture balance, but remains unchanged in size. 12/15; the patient's anterior tibial wound has reopened, however the area on his right first metatarsal head is closed. The major wound is actually on the superior part of his surgical wound in the left lateral thigh. Not a completely viable surface under  illumination. This may at some point require a debridement I think he is currently using Prisma. As noted the left medial foot wound has closed 06/14/2022: The anterior tibial wound has closed. The lateral leg wound has a better surface but is basically unchanged in size. The left medial foot wound has reopened. It looks as though there was some callus accumulation and moisture got under the callus which caused the tissue to break down again. 06/21/2022: A new wound has opened up just distal to the previous anterior tibial wound. It is small but has hypertrophic granulation tissue present. The lateral leg wound is a little bit narrower and has a layer of slough on the surface. The left medial foot wound is down to just a pinhole. His custom orthotics should be available next week. 06/28/2022: The wound on his first metatarsal head has healed. He has developed a new small wound on his medial lower leg, in an old scar site. The lateral leg wound continues to contract but continues to accumulate slough, as well. 07/03/2022: Despite wearing his custom orthopedic shoes, he managed to reopen the wound on his first metatarsal head. He says he thinks his foot got wet and then some skin lifted up and he peeled this away. Both of the lower leg wounds are smaller and have some dry eschar on the surface. The lateral leg wound is quite a bit narrower today. 07/12/2022: The medial lower leg wound is closed. The  anterior lower leg wound has contracted considerably. The lateral upper leg wound is narrower with a layer of slough on the surface. The first metatarsal head wound is also smaller, but had copious drainage which saturated the foam border dressing and resulted in some periwound tissue maceration. Fortunately there was no breakdown at this site. 07/19/2022: The lower leg shows signs of significant maceration; I think he must be sweating excessively inside his cast. There are several areas of skin breakdown  present. The wound on his foot is smaller and that on his lateral leg is narrower and is shorter by about a centimeter. 07/26/2022: Last week we used a zinc Coflex wrap prior to applying his total contact cast and this has had the effect of keeping his skin from getting macerated this week. The anterior leg wound has epithelialized substantially. The lateral leg wound is significantly smaller with just a bit of slough on the surface. The first metatarsal head wound is also smaller this week. 08/02/2022: The anterior leg wound was closed on arrival, but while he was sitting in the room, he picked it open again. The lateral leg wound is smaller with just a little slough on the surface and the first metatarsal head wound has contracted further, as well. 08/09/2022: The first metatarsal head wound is covered with callus. Underneath the callus, it is nearly completely closed. The lateral leg wound is smaller again this week. The anterior leg wound looks better, but he has such heavy buildup of old skin, that moisture is getting underneath this, becoming trapped, and causing the underlying skin to get macerated and open up. 08/16/2022: The first metatarsal head wound is closed. The lateral leg wound continues to contract and is quite a bit smaller again this week. There is just a small, superficial opening remaining on his anterior tibial surface. 08/23/2022: The first metatarsal head wound has, by some miracle, remained closed. The lateral leg wound is substantially smaller with multiple areas of epithelialization. The anterior tibial surface wound is also quite a bit smaller and very clean. 08/30/2022: Unfortunately, his first metatarsal head wound opened up again. It happened in the same fashion as it has on prior occasions. Moisture got under dried skin/callus and created a wound when he removed his sock, taking the skin with it. The anterior tibial surface has a thick shell of hyperkeratotic skin. This has been  contributing to ongoing repeat wounding events as moisture gets underneath this and causes tissue breakdown. 3/15; patient presents for follow-up. His anterior left leg wound has healed. He still has the wound to the left lateral aspect and left first met head. We have been using silver alginate and endoform to these areas under [employer]. He has no issues or complaints today. He has been taking Augmentin and reports improvement to his symptoms to the left first met head. 09/13/2022: He has accumulated more thick dry skin in sheets on his lower leg. The lateral leg wound is about the same size and the left first metatarsal head wound is a little bit smaller. There is slough on both surfaces. There is callus buildup around the foot wound. 09/20/2022: The lateral leg wound is a little bit narrower and the left first metatarsal head wound also seems to have contracted slightly. There is slough on both surfaces. He has a little skin breakdown on his anterior tibial surface. 09/27/2022: The lateral leg wound continues to contract and is quite clean. The first metatarsal head wound is also smaller. There is some  perimeter callus and slough accumulation on the foot. The anterior tibial surface is closed. [name], [name] ([id number]) [id number] Page 18 of 21 10/04/2022: Both of his wounds are smaller today, particularly the first metatarsal head wound. 10/18/2022: He missed his appointment last week and ended up cutting off his wrap on Saturday. The anterior tibial wound reopened. It is fairly superficial with a little bit of slough on the surface. His lateral leg wound is smaller with some slough and eschar buildup. The first metatarsal head wound is also smaller with some callus and slough accumulation. 10/25/2022: All wounds are smaller. There is slough and eschar on the lateral leg and slough and callus on the plantar foot wound. The anterior tibial wound is clean and flush with the  surrounding skin. No debris accumulation here. 11/01/2022: The wounds are all smaller again this week. There is slough on the lateral leg and some minimal slough and eschar on the anterior tibial wound. There is callus accumulation on the first metatarsal head site, along with slough. There is also a yeasty odor coming from the foot. 11/08/2022: The lateral leg wound is smaller except where he picked some skin while waiting to be seen. There is a little bit of slough on the surface. The anterior tibial wound is closed. The first metatarsal head site has gotten macerated once again and has a lot of spongy wet tissue and callus around it. No yeast odor today. 11/15/2022: The lateral leg wound continues to contract. There is now a band of epithelium dividing it into 2 areas. Minimal slough accumulation. He managed to pick open a new wound on his medial lower leg. The first metatarsal head site is smaller with some callus accumulation but no tissue maceration. 11/22/2022: The lateral leg wound is smaller again by about a third today. There is a little bit of slough on the surface with some periwound eschar. The new wound that he picked open last week has healed but he picked open 3 new small wounds on his anterior tibial surface, once again due to his picking at his skin. The first metatarsal head wound looks about the same. There has been some callus accumulation and there is more edema present, as he was not put in a compression wrap last week. 11/29/2022: The lateral leg wound is smaller again today. There is a little periwound eschar and some slough present. The wounds on his anterior tibial surface are all closed except for 1 that has a little bit of slough on the open portion with eschar covering it. The first metatarsal head wound measured a little bit smaller today, but mostly looks about the same. Edema control is better. 12/06/2022: The anterior tibial wound is closed. The lateral leg wound is smaller  with some slough and eschar accumulation. The plantar foot ulcer is about the same size, but the tissue does show some evidence of the fact that he was on his feet quite a bit more this past week; there was a death in his family. There has been more callus accumulation and the tissues are little bit more purpleish. 12/13/2022: He picked the skin on his anterior tibia and reopened a wound there while he was waiting to be seen in clinic. The lateral leg wound is much smaller with a little bit of slough and eschar. The plantar foot ulcer is smaller today with callus and slough buildup, but without the pressure induced tissue injury that was seen last week. 12/20/2022: The anterior tibial wound has healed.  The lateral leg wound is smaller again this week. There is a little bit of eschar buildup on the surface. The foot had a fairly strong odor coming from it that persisted even after washing. The wound itself looks like its gotten a little smaller but he has built up thick callus, once again. 12/26/2022: The lateral leg wound is down to just a narrow superficial slit. There is slough and a little bit of dry skin present. The foot is in much better shape today. There is less callus accumulation and no odor. The skin edges are starting to roll inward, however. 01/03/2023: The lateral leg wound is healed. The skin is quite dry but the wound has closed. The foot continues to contract. He has a fair amount of periwound callus accumulation and the surface of the is a little drier than ideal. 01/10/2023: The large lateral leg wound remains closed. He managed to pick off some of his dry skin and has multiple small open superficial wounds on his lower leg. The foot wound measures smaller, but he has a blister immediately adjacent to it. 01/17/2023: The large lateral leg wound remains closed. The small superficial wounds on his lower leg have also closed. The foot wound is about the same and the blister area immediately  adjacent to it has dark eschar on the surface. The foot wound looks a bit dry. 01/24/2023: He picked at some dry skin on his lateral leg wound and reopened. The surface is dry and fibrotic. The foot wound is slightly smaller but has periwound callus and the edges are rolling inward again. 01/31/2023: His lateral leg wound is larger again today. It appears that the Ace bandage may be rubbing and causing some tissue breakdown. His foot wound is also a bit larger and the tissue does not appear particularly healthy. 02/07/2023: The lateral leg wound improved quite significantly over the last week. He has been applying additional gauze over the site before applying the Ace bandage and this seems to have minimized the rubbing. The foot wound is about the same. The culture that I took last week returned with a polymicrobial population of predominantly skin flora. He is currently taking Augmentin as recommended by the culture data. 02/14/2023: The lateral leg wound is covered in a layer of eschar. Underneath, the wound is nearly healed again. The foot wound looks better today. The depth around the edges has decreased. The tissue has a healthier appearance. He is still taking Augmentin. 02/21/2023: The lateral leg wound is smaller with a layer of eschar on the surface, but is not quite healed. The foot wound is stable but the depth around the edges is almost completely obliterated. There is some callus and senescent skin around the edges. 03/07/2023: The lateral leg wound is down to just a pinhole with a little bit of eschar on the surface. The foot wound demonstrates evidence that he has been walking excessively the past week and there is more callus with moisture underneath it, but the breakdown is limited to the callus layer and there is still intact skin underneath. 03/14/2023: The lateral leg wound is closed. The foot wound looks much improved this week. There is very minimal callus accumulation around the wound  edges and the tissue surface appears healthier. It seems the adjustments made to his shoe by the [facility] were effective. 03/28/2023: The lateral leg wound has reopened. The patient reports that he was not moisturizing it as much as he probably should have been and it cracked  and some skin peeled away. The foot wound shows signs of pressure-related trauma with some bruising and increased callus. The patient admits to extensive walking this past week while taking his wife to medical appointments. He does have a knee scooter and it is unclear why he does not use it. 04/04/2023: The lateral leg wound is larger again today. The patient reports that he thinks maybe some dry skin pulled off when he was changing his dressing. It is quite dry and he states that he is using just water to moisten his endoform, rather than the hydrogel that was recommended. The foot wound is the same size, but the tissue is in better condition, without substantial bruising or friction-related trauma. 04/11/2023: Both wounds look better this week. Much of the lateral leg wound has epithelialized. The moisture balance is much better. The foot wound looks better than it has in months. The granulation tissue is flush with the surrounding skin and there is not much callus accumulation. 04/25/2023: The patient was out of town last week. The lateral leg ulcer has good moisture balance, but has opened up a little bit more at the most proximal aspect. The foot ulcer has a layer of callus around the outside but is otherwise fairly clean with minimal slough buildup. 05/02/2023: He has 2 new wounds today. He has picked at the skin on his left anterior tibial surface and opened up the wound location. He picked a callus on his medial foot adjacent to his existing metatarsal head wound and opened up the site there. The lateral leg wound looks a little bit better this week and the moisture MAXINE, NISH ([id number])  [id number] Page 19 of 21 balance is excellent. The metatarsal head wound has a little bit of breakdown at the more proximal end but otherwise is also making improvement. 05/09/2023: Both foot ulcers are smaller and look better today. The lower leg ulcer is also smaller. The lateral leg ulcer is basically the same size. All of the wounds have slough on the surface and there is some callus accumulation around the plantar first metatarsal head wound. 05/16/2023: The smaller of the foot ulcers is nearly healed. The plantar foot ulcer is stable. The lower leg ulcer is smaller. The lateral leg ulcer is a little bit smaller and the moisture balance is good. There is slough on all of the wound surfaces. 06/13/2023: The small, more dorsal foot wounds have epithelialized. The plantar foot ulcer is stable in size, but the tissue appears healthier. The lower leg ulcer is about the same size. The lateral leg ulcer is larger today. He reports that he had it nearly healed and the skin peeled off again as it got dry. Objective Constitutional no acute distress. Vitals Time Taken: 10:00 AM, Height: 74 in, Weight: 238 lbs, BMI: 30.6, Temperature: 97.9 F, Pulse: 69 bpm, Respiratory Rate: 20 breaths/min, Blood Pressure: 134/73 mmHg, Capillary Blood Glucose: 360 mg/dl. General Notes: glucose per pt report at present Respiratory Normal work of breathing on room air.. General Notes: 06/13/2023: The small, more dorsal foot wounds have epithelialized. The plantar foot ulcer is stable in size, but the tissue appears healthier. The lower leg ulcer is about the same size. The lateral leg ulcer is larger today. Integumentary (Hair, Skin) Wound #18R status is Open. Original cause of wound was Gradually Appeared. The date acquired was: 08/23/2020. The wound has been in treatment 113 weeks. The wound is located on the Left,Plantar Metatarsal head first. The wound measures 2cm length x  2.3cm width x  0.2cm depth; 3.613cm^2 area and 0.723cm^3 volume. There is Fat Layer (Subcutaneous Tissue) exposed. There is no tunneling or undermining noted. There is a medium amount of serosanguineous drainage noted. The wound margin is epibole. There is large (67-100%) red granulation within the wound bed. There is a small (1-33%) amount of necrotic tissue within the wound bed including Adherent Slough. The periwound skin appearance had no abnormalities noted for moisture. The periwound skin appearance had no abnormalities noted for color. The periwound skin appearance exhibited: Callus. Periwound temperature was noted as No Abnormality. Wound #22R status is Open. Original cause of wound was Bump. The date acquired was: 06/03/2021. The wound has been in treatment 105 weeks. The wound is located on the Left,Proximal,Lateral Lower Leg. The wound measures 1.6cm length x 0.9cm width x 0.1cm depth; 1.131cm^2 area and 0.113cm^3 volume. There is Fat Layer (Subcutaneous Tissue) exposed. There is no tunneling or undermining noted. There is a medium amount of serous drainage noted. The wound margin is distinct with the outline attached to the wound base. There is large (67-100%) red granulation within the wound bed. There is no necrotic tissue within the wound bed. The periwound skin appearance had no abnormalities noted for moisture. The periwound skin appearance had no abnormalities noted for color. The periwound skin appearance exhibited: Scarring. Periwound temperature was noted as No Abnormality. Wound #36 status is Open. Original cause of wound was Skin T ear/Laceration. The date acquired was: 05/02/2023. The wound has been in treatment 6 weeks. The wound is located on the Left,Anterior Lower Leg. The wound measures 1cm length x 1.8cm width x 0.1cm depth; 1.414cm^2 area and 0.141cm^3 volume. There is Fat Layer (Subcutaneous Tissue) exposed. There is no tunneling or undermining noted. There is a medium amount of serous  drainage noted. The wound margin is distinct with the outline attached to the wound base. There is medium (34-66%) red, pink granulation within the wound bed. There is a medium (34-66%) amount of necrotic tissue within the wound bed including Adherent Slough. The periwound skin appearance had no abnormalities noted for texture. The periwound skin appearance had no abnormalities noted for moisture. The periwound skin appearance exhibited: Hemosiderin Staining. Periwound temperature was noted as No Abnormality. Wound #37 status is Healed - Epithelialized. Original cause of wound was Trauma. The date acquired was: 04/29/2023. The wound has been in treatment 6 weeks. The wound is located on the Left,Medial Foot. The wound measures 0cm length x 0cm width x 0cm depth; 0cm^2 area and 0cm^3 volume. There is no tunneling or undermining noted. There is a none present amount of drainage noted. The wound margin is distinct with the outline attached to the wound base. There is no granulation within the wound bed. There is no necrotic tissue within the wound bed. The periwound skin appearance had no abnormalities noted for moisture. The periwound skin appearance had no abnormalities noted for color. The periwound skin appearance exhibited: Callus. Periwound temperature was noted as No Abnormality. 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 There was a Excisional Skin/Subcutaneous Tissue Debridement with a total area of 7.22 sq cm performed by [name], MD. With the following instrument(s): Curette to remove Viable and Non-Viable tissue/material. Material removed includes Callus, Subcutaneous Tissue, Slough, and Skin: Epidermis after achieving pain control using Lidocaine 4% Topical Solution. No specimens were taken. A time out was conducted at 10:35, prior to the start of the procedure. A Minimum amount of bleeding was controlled with Pressure. The procedure was tolerated well with a pain level of 0 throughout and a pain level of 0 following the procedure. Post Debridement Measurements: 2cm length x 2.3cm width x 0.1cm depth; 0.361cm^3 volume. Character of Wound/Ulcer Post Debridement is improved. Severity of Tissue Post Debridement is: Fat layer exposed. Post procedure Diagnosis Wound #18R: Same as Pre-Procedure Wound #22R Pre-procedure diagnosis of Wound #22R is a Cyst located on the Left,Proximal,Lateral Lower Leg . There was a Excisional Skin/Subcutaneous Tissue Debridement with a total area of 1.13 sq cm performed by [name], MD. With the following instrument(s): Curette to remove Viable and Non-Viable tissue/material. Material removed includes Subcutaneous Tissue and Slough and after achieving pain control using Lidocaine 4% T opical Solution. No specimens were taken. A time out was conducted at 10:35, prior to the start of the procedure. A Minimum amount of bleeding was controlled with Pressure. The procedure was tolerated well with a pain level of 0 throughout and a pain level of 0 following the procedure. Post Debridement Measurements: 1.6cm length x 0.9cm width x 0.1cm depth; 0.113cm^3 volume. Character of  Wound/Ulcer Post Debridement is improved. Post procedure Diagnosis Wound #22R: Same as Pre-Procedure Wound #36 Pre-procedure diagnosis of Wound #36 is a Diabetic Wound/Ulcer of the Lower Extremity located on the Left,Anterior Lower Leg .Severity of Tissue Pre Debridement is: Fat layer exposed. There was a Excisional Skin/Subcutaneous Tissue Debridement with a total area of 1.41 sq cm performed by [name], MD. With the following instrument(s): Curette to remove Viable and Non-Viable tissue/material. Material removed includes Subcutaneous Tissue and Slough and after achieving pain control using Lidocaine 4% T opical Solution. No specimens were taken. A time out was conducted at 10:35, prior to the start of the procedure. A Minimum amount of bleeding was controlled with Pressure. The procedure was tolerated well with a pain level of 0 throughout and a pain level of 0 following the procedure. Post Debridement Measurements: 1cm length x 1.8cm width x 0.1cm depth; 0.141cm^3 volume. Character of Wound/Ulcer Post Debridement is improved. Severity of Tissue Post Debridement is: Fat layer exposed. Post procedure Diagnosis Wound #36: Same as Pre-Procedure Plan Follow-up Appointments: Return Appointment in 1 week. - Dr. Lady Gary 12/26 @ 10:00 Anesthetic: (In clinic) Topical Lidocaine 4% applied to wound bed Bathing/ Shower/ Hygiene: May shower and wash wound with soap and water. Edema Control - Orders / Instructions: Elevate legs to the level of the heart or above for 30 minutes daily and/or when sitting for 3-4 times a day throughout the day. Avoid standing for long periods of time. Patient to wear own compression stockings every day. Moisturize legs daily. Off-Loading: Wound #18R Left,Plantar Metatarsal head first: Other: - custom diabetic shoes Additional Orders / Instructions: Follow Nutritious Diet - vitamin C 500 mg 3 times a day and zinc 30-50 mg a day WOUND #18R: - Metatarsal head  first Wound Laterality: Plantar, Left Cleanser: Normal Saline 1 x  Per Day/30 Days Discharge Instructions: Cleanse the wound with Normal Saline prior to applying a clean dressing using gauze sponges, not tissue or cotton balls. Cleanser: Soap and Water 1 x Per Day/30 Days Discharge Instructions: May shower and wash wound with dial antibacterial soap and water prior to dressing change. Cleanser: Wound Cleanser 1 x Per Day/30 Days Discharge Instructions: Cleanse the wound with wound cleanser prior to applying a clean dressing using gauze sponges, not tissue or cotton balls. Prim Dressing: Promogran Prisma Matrix, 4.34 (sq in) (silver collagen) 1 x Per Day/30 Days ary Discharge Instructions: Moisten collagen with saline or hydrogel Secondary Dressing: Optifoam Non-Adhesive Dressing, 4x4 in (Generic) 1 x Per Day/30 Days Discharge Instructions: Apply over primary dressing as directed. Secondary Dressing: Woven Gauze Sponges 2x2 in (Generic) 1 x Per Day/30 Days Discharge Instructions: Apply over primary dressing as directed. Secured With: 34M Medipore H Soft Cloth Surgical T ape, 4 x 10 (in/yd) 1 x Per Day/30 Days Discharge Instructions: Secure with tape as directed. WOUND #22R: - Lower Leg Wound Laterality: Left, Lateral, Proximal Cleanser: Soap and Water 1 x Per Day/30 Days Discharge Instructions: May shower and wash wound with dial antibacterial soap and water prior to dressing change. Cleanser: Wound Cleanser 1 x Per Day/30 Days Discharge Instructions: Cleanse the wound with wound cleanser prior to applying a clean dressing using gauze sponges, not tissue or cotton balls. Prim Dressing: Promogran Prisma Matrix, 4.34 (sq in) (silver collagen) 1 x Per Day/30 Days ary Discharge Instructions: Moisten collagen with saline or hydrogel Secondary Dressing: Woven Gauze Sponge, Non-Sterile 4x4 in 1 x Per Day/30 Days Discharge Instructions: Apply over primary dressing as directed. Secured With: 34M  Medipore H Soft Cloth Surgical T ape, 4 x 10 (in/yd) 1 x Per Day/30 Days Discharge Instructions: Secure with tape as directed. WOUND #36: - Lower Leg Wound Laterality: Left, Anterior Cleanser: Soap and Water 1 x Per Day/30 Days Discharge Instructions: May shower and wash wound with dial antibacterial soap and water prior to dressing change. Cleanser: Wound Cleanser 1 x Per Day/30 Days Discharge Instructions: Cleanse the wound with wound cleanser prior to applying a clean dressing using gauze sponges, not tissue or cotton balls. Prim Dressing: Maxorb Extra Ag+ Alginate Dressing, 2x2 (in/in) 1 x Per Day/30 Days ary Discharge Instructions: Apply to wound bed as instructed Secondary Dressing: Zetuvit Plus Silicone Border Dressing 4x4 (in/in) 1 x Per Day/30 Days Discharge Instructions: Apply silicone border over primary dressing as directed. SIRJAMES, NIM ([id number]) [id number] Page 21 of 21 06/13/2023: The small, more dorsal foot wounds have epithelialized. The plantar foot ulcer is stable in size, but the tissue appears healthier. The lower leg ulcer is about the same size. The lateral leg ulcer is larger today. He reports that he had it nearly healed and the skin peeled off again as it got dry. I used a curette to debride slough and subcutaneous tissue from both of the leg wounds. I debrided skin, slough, and subcutaneous tissue from the plantar foot wound. I am going to change the contact layer for the plantar foot and lateral leg to Prisma silver collagen to see if we can hold in a little bit more moisture, particularly on the lateral leg. Continue silver alginate to the anterior tibial site. Follow-up in 1 week. Electronic Signature(s) Signed: 06/18/2023 7:48:39 AM By: Shawn Stall RN, BSN Signed: 06/18/2023 8:19:08 AM By: [name] MD FACS Previous Signature: 06/13/2023 12:54:48 PM Version By: [name] MD FACS Entered By: Shawn Stall on  06/18/2023 04:46:08 -------------------------------------------------------------------------------- SuperBill Details Patient Name: Date of Service: JOSEHUA, LITALIEN 06/13/2023 Medical Record Number: [id number] Patient Account Number: [id number] Date of Birth/Sex: Treating RN: August 14, 1950 (72 y.o. 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 L97.828 Non-pressure chronic ulcer of other part of left lower leg with other specified severity I89.0 Lymphedema, not elsewhere classified I87.322 Chronic venous hypertension (idiopathic) with inflammation of left lower extremity L85.9 Epidermal thickening, unspecified E11.51 Type 2 diabetes mellitus with diabetic peripheral angiopathy without gangrene Facility Procedures : CPT4 Code: [id number] Description: 11042 - DEB SUBQ TISSUE 20 SQ CM/< ICD-10 Diagnosis Description L97.528 Non-pressure chronic ulcer of other part of left foot with other specified sever L97.128 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 L97.828  Non-pressure chronic ulcer of other part of left lower leg with other specified Modifier: ity severity Quantity: 1 Physician Procedures : CPT4 Code Description Modifier [id number] 11042 - WC PHYS SUBQ TISS 20 SQ CM ICD-10 Diagnosis Description 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 with  other specified severity L97.828 Non-pressure chronic ulcer of other part of left lower leg with other specified severity Quantity: 1 Electronic Signature(s) Signed: 06/13/2023 12:54:48 PM By: [name] MD FACS Entered By: [name] on 06/13/2023 07:55:34

## 2023-06-20 ENCOUNTER — Encounter (HOSPITAL_BASED_OUTPATIENT_CLINIC_OR_DEPARTMENT_OTHER): Payer: Medicare Other | Admitting: General Surgery

## 2023-06-20 DIAGNOSIS — I87332 Chronic venous hypertension (idiopathic) with ulcer and inflammation of left lower extremity: Secondary | ICD-10-CM | POA: Diagnosis not present

## 2023-06-20 MED ORDER — OMNIPOD 5 DEXG7G6 PODS GEN 5 MISC
6 refills | Status: DC
Start: 2023-06-20 — End: 2023-07-15

## 2023-06-20 NOTE — Progress Notes (Signed)
[name], [name] ([id number]) [id number] Page 1 of 22 Visit Report for 06/20/2023 Chief Complaint Document Details Patient Name: Date of Service: [name], [name] 06/20/2023 10:00 A M Medical Record Number: [id number] Patient Account Number: [id number] Date of Birth/Sex: Treating RN: 1950/07/01 (72 y.o. M) Primary Care Provider: [name] Other Clinician: Referring Provider: Treating Provider/Extender: [name] in Treatment: 114 Information Obtained from: Patient Chief Complaint Left leg and foot ulcers 04/12/2021; patient is here for wounds on his left lower leg and left plantar foot over the first metatarsal head Electronic Signature(s) Signed: 06/20/2023 10:51:24 AM By: [name] MD FACS Entered By: [name] on 06/20/2023 07:45:33 -------------------------------------------------------------------------------- Debridement Details Patient Name: Date of Service: [name] Grapes. 06/20/2023 10:00 A M Medical Record Number: [id number] Patient Account Number: [id number] Date of Birth/Sex: Treating RN: 12/01/1950 (72 y.o. [name] Primary Care Provider: [name] Other Clinician: Referring Provider: Treating Provider/Extender: [name] in Treatment: 114 Debridement Performed for Assessment: Wound #22R Left,Proximal,Lateral Lower Leg Performed By: Physician [name], MD The following information was scribed by: Redmond Pulling The information was scribed for: [name] Debridement Type: Debridement Level of Consciousness (Pre-procedure): Awake and Alert Pre-procedure Verification/Time Out Yes - 10:27 Taken: Start Time: 10:27 Pain Control: Lidocaine 5% topical ointment Percent of Wound Bed Debrided: 100% T Area Debrided (cm): otal 1.06 Tissue and other material debrided: Eschar, Slough, Subcutaneous, Slough Level: Skin/Subcutaneous Tissue Debridement  Description: Excisional Instrument: Curette Bleeding: Minimum Hemostasis Achieved: Pressure Response to Treatment: Procedure was tolerated well Level of Consciousness (Post- Awake and Alert procedure): Post Debridement Measurements of Total Wound Length: (cm) 1.5 Width: (cm) 0.9 Depth: (cm) 0.1 Volume: (cm) 0.106 Character of Wound/Ulcer Post Debridement: Improved [name] ([id number]) [id number] Page 2 of 22 Post Procedure Diagnosis Same as Pre-procedure Electronic Signature(s) Signed: 06/20/2023 10:51:24 AM By: [name] MD FACS Signed: 06/20/2023 3:45:54 PM By: Redmond Pulling RN, BSN Entered By: Redmond Pulling on 06/20/2023 07:30:01 -------------------------------------------------------------------------------- Debridement Details Patient Name: Date of Service: [name] Grapes. 06/20/2023 10:00 A M Medical Record Number: [id number] Patient Account Number: [id number] Date of Birth/Sex: Treating RN: [birth year] (71 y.o. [name] Primary Care Provider: [name] Other Clinician: Referring Provider: Treating Provider/Extender: [name] in Treatment: 114 Debridement Performed for Assessment: Wound #36 Left,Anterior Lower Leg Performed By: Physician [name], MD Debridement Type: Debridement Severity of Tissue Pre Debridement: Fat layer exposed Level of Consciousness (Pre-procedure): Awake and Alert Pre-procedure Verification/Time Out Yes - 10:27 Taken: Start Time: 10:27 Pain Control: Lidocaine 5% topical ointment Percent of Wound Bed Debrided: 100% T Area Debrided (cm): otal 0.6 Tissue and other material debrided: Slough, Subcutaneous, Slough Level: Skin/Subcutaneous Tissue Debridement Description: Excisional Instrument: Curette Bleeding: Minimum Hemostasis Achieved: Pressure Response to Treatment: Procedure was tolerated well Level of Consciousness (Post- Awake and  Alert procedure): Post Debridement Measurements of Total Wound Length: (cm) 1.1 Width: (cm) 0.7 Depth: (cm) 0.1 Volume: (cm) 0.06 Character of Wound/Ulcer Post Debridement: Improved Severity of Tissue Post Debridement: Fat layer exposed Post Procedure Diagnosis Same as Pre-procedure Electronic Signature(s) Signed: 06/20/2023 10:51:24 AM By: [name] MD FACS Signed: 06/20/2023 3:45:54 PM By: Redmond Pulling RN, BSN Entered By: Redmond Pulling on 06/20/2023 07:30:57 -------------------------------------------------------------------------------- Debridement Details Patient Name: Date of Service: [name] Grapes. 06/20/2023 10:00 A M Medical Record Number: [id number] Patient Account Number: [id number] Date of Birth/Sex: Treating RN: 11-28-1950 (72 y.o. [name], [name], [name] ([id number]) [id number] Page 3  of 22 Primary Care Provider: [name] Other Clinician: Referring Provider: Treating Provider/Extender: [name] in Treatment: 114 Debridement Performed for Assessment: Wound #18R Left,Plantar Metatarsal head first Performed By: Physician [name], MD The following information was scribed by: Redmond Pulling The information was scribed for: [name] Debridement Type: Debridement Severity of Tissue Pre Debridement: Fat layer exposed Level of Consciousness (Pre-procedure): Awake and Alert Pre-procedure Verification/Time Out Yes - 10:27 Taken: Start Time: 10:27 Pain Control: Lidocaine 5% topical ointment Percent of Wound Bed Debrided: 100% T Area Debrided (cm): otal 3.45 Tissue and other material debrided: Non-Viable, Slough, Subcutaneous, Skin: Dermis , Skin: Epidermis, Slough Level: Skin/Subcutaneous Tissue Debridement Description: Excisional Instrument: Curette Bleeding: Minimum Hemostasis Achieved: Pressure Response to Treatment: Procedure was tolerated well Level of Consciousness  (Post- Awake and Alert procedure): Post Debridement Measurements of Total Wound Length: (cm) 2 Width: (cm) 2.2 Depth: (cm) 0.2 Volume: (cm) 0.691 Character of Wound/Ulcer Post Debridement: Improved Severity of Tissue Post Debridement: Fat layer exposed Post Procedure Diagnosis Same as Pre-procedure Electronic Signature(s) Signed: 06/20/2023 10:51:24 AM By: [name] MD FACS Signed: 06/20/2023 3:45:54 PM By: Redmond Pulling RN, BSN Entered By: Redmond Pulling on 06/20/2023 07:32:06 -------------------------------------------------------------------------------- HPI Details Patient Name: Date of Service: [name] Grapes. 06/20/2023 10:00 A M Medical Record Number: [id number] Patient Account Number: [id number] Date of Birth/Sex: Treating RN: 20-Jan-1951 (72 y.o. M) Primary Care Provider: [name] Other Clinician: Referring Provider: Treating Provider/Extender: [name] in Treatment: 114 History of Present Illness HPI Description: 10/11/17; Mr. Shames is a 72 year old man who tells me that in 2015 he slipped down the latter traumatizing his left leg. He developed a wound in the same spot the area that we are currently looking at. He states this closed over for the most part although he always felt it was somewhat unstable. In 2016 he hit the same area with the door of his car had this reopened. He tells me that this is never really closed although sometimes an inflow it remains open on a constant basis. He has not been using any specific dressing to this except for topical antibiotics the nature of which were not really sure. His primary doctor did send him to see Dr. Jacinto Halim of interventional cardiology. He underwent an angiogram on 08/06/17 and he underwent a PTA and directional atherectomy of the lesser distal SFA and popliteal arteries which resulted in brisk improvement in blood flow. It was noted that he had 2 vessel runoff through the anterior tibial  and peroneal. He is also been to see vascular and interventional radiologist. He was not felt to have any significant superficial venous insufficiency. Presumably is not a candidate for any ablation. It was suggested he come here for wound care. The patient is a type II diabetic on insulin. He also has a history of venous insufficiency. ABIs on the left were noncompressible in our clinic 10/21/17; patient we admitted to the clinic last week. 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 We [name], [name] ([id number]) [id number] Page 4 of 22 put Iodosorb on him after an aggressive debridement and 3 layer compression. He complained of pain in his ankle and itching with is skin in fact he scratched the area on the medial calf superiorly at the rim of our wraps and he has 2 small open areas in that location today which are new. I changed his primary dressing today to  silver collagen. As noted he is already had revascularization and does not have any significant superficial venous insufficiency that would be amenable to ablation 10/28/17; patient admitted to the clinic 2 weeks ago. He has a smaller Wound. Scratch injury from last week revealed. There is large wound over the tibial area. This is smaller. Granulation looks healthy. No need for debridement. 11/04/17; the wound on the left lateral calf looks better. Improved dimensions. Surface of this looks better. We've been maintaining him and Kerlix Coban wraps. He finds this much more comfortable. Silver collagen dressing 11/11/17; left lateral Wound continues to look healthy be making progress. Using a #5 curet I removed removed nonviable skin from the surface of the wound and then necrotic debris from the wound surface. Surface of the wound continues to look healthy. He also has an open area on the left great toenail bed. We've been using topical  antibiotics. 11/19/17; left anterior lateral wound continues to look healthy but it's not closed. He also had a small wound above this on the left leg Initially traumatic wounds in the setting of significant chronic venous insufficiency and stasis dermatitis 11/25/17; left anterior wounds superiorly is closed still a small wound inferiorly. 12/02/17; left anterior tibial area. Arrives today with adherent callus. Post debridement clearly not completely closed. Hydrofera Blue under 3 layer compression. 12/09/17; left anterior tibia. Circumferential eschar however the wound bed looks stable to improved. We've been using Hydrofera Blue under 3 layer compression 12/17/17; left anterior tibia. Apparently this was felt to be closed however when the wrap was taken off there is a skin tear to reopen wounds in the same area we've been using Hydrofera Blue under 3 layer compression 12/23/17 left anterior tibia. Not close to close this week apparently the [facility] was stuck to this again. Still circumferential eschar requiring debridement. I put a contact layer on this this time under the Hydrofera Blue 12/31/17; left anterior tibia. Wound is better slight amount of hyper-granulation. Using Hydrofera Blue over Adaptic. 01/07/18; left anterior tibia. The wound had some surface eschar however after this was removed he has no open wound.he was already revascularized by Dr. Jacinto Halim when he came to our clinic with atherectomy of the left SFA and popliteal artery. He was also sent to interventional radiology for venous reflux studies. He was not felt to have significant reflux but certainly has chronic venous changes of his skin with hemosiderin deposition around this area. He will definitely need to lubricate his skin and wear compression stocking and I've talked to him about this. READMISSION 05/26/2018 This is a now 72 year old man we cared for with traumatic wounds on his left anterior lower extremity. He had been  previously revascularized during that admission by Dr. Jacinto Halim. Apparently in follow-up Dr. Jacinto Halim noted that he had deterioration in his arterial status. He underwent a stent placement in the distal left SFA on 04/22/2018. 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 He went back to the angiography suite on 04/30/2018 he underwent PTA and balloon angioplasty of the occluded left mid anterior tibial artery, thrombotic occlusion went from 100 to 0% which reconstitutes the posterior tibial artery. He had thrombectomy and aspiration of the peroneal artery. The stent placed in the distal SFA left SFA was still occluded. 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 Noninvasive studies on 05/02/2018 showed  an TBI on the left at 0.43 and 0.82 on the right. He has been recuperating at [facility] home in [facility] after the most recent hospitalization. He is going home tomorrow. He tells me that 2 weeks ago he traumatized the tip of his left fifth toe. He came in urgently for our review of this. This was a history of before I noted that Dr. Jacinto Halim had already noted dry gangrenous changes of the left fifth toe 06/09/2018; 2-week follow-up. I did contact Dr. Jacinto Halim after his last appointment and he apparently saw 1 of Dr. Verl Dicker colleagues the next day. He does not follow-up with Dr. Jacinto Halim himself until Thursday of this week. 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 Nevertheless there is no evidence of infection no drainage and no pain. He had a new area that this week when we were signing him in today on the left anterior mid tibia area, this is in close proximity to the previous wound we have dealt with in this clinic. 06/23/2018; 2-week follow-up. I did not receive a recent note from Dr. Jacinto Halim to review today. Our  office is trying to obtain this. He is apparently not planning to do further vascular interventions and wondered about compression to try and help with the patient's chronic venous insufficiency. However we are also concerned about the arterial flow. He arrives in clinic today with a new area on the left third toe. The areas on the calf/anterior tibia are close to closing. The left fifth toe is still mummified using Betadine. -In reviewing things with the patient he has what sounds like claudication with mild to moderate amount of activity. 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 I prescribed Levaquin over the phone while we attempted to arrange a plan of care. However the patient called yesterday to report he had low-grade fever and he came in today acutely. There is been a marked deterioration in the left third toe with spreading cellulitis up into the dorsal left foot. He was referred to the emergency room. Readmission: 06/29/2020 patient presents today for reevaluation here in our clinic he was previously treated by Dr. Leanord Hawking at the latter part of 2019 in 2 the beginning of 2020. Subsequently we have not seen him since that time in the interim he did have evaluation with vein and vascular specialist specifically Dr. Bo Mcclintock who did perform quite extensive work for a left femoral to anterior tibial artery bypass. With that being said in the interim the patient has developed significant lymphedema and has wounds that he tells me have really never healed in regard to the incision site on the left leg. He also has multiple wounds on the feet for various reasons some of which is that he tends to pick at his feet. Fortunately there is no signs of active infection systemically at this time he does have some wounds that are little bit deeper but most are fairly superficial he seems to have good blood flow and overall everything appears to be healthy I see no bone  exposed and no obvious signs of osteomyelitis. I do not know that he necessarily needs a x-ray at this point although that something we could consider depending on how things progress. The patient does have a history of lymphedema, diabetes, this is type II, chronic kidney disease stage III, hypertension, and history of peripheral vascular disease. 07/05/2020; patient admitted last week. Is a patient I remember  from 2019 he had a spreading infection involving the left foot and we sent him to the hospital. 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 He subsequently had a left femoral to anterior tibial bypass by Dr.Cain vein and vascular. He also has severe lymphedema with chronic skin changes related to that on the left leg. The most problematic area that was new today was on the left medial great toe. This was apparently a small area last week there was purulent drainage which our intake nurse cultured. Also areas on the left medial foot and heel left lateral foot. He has 2 areas on the left medial calf left lateral calf in the setting of the severe lymphedema. 07/13/2020 on evaluation today patient appears to be doing better in my opinion compared to his last visit. The good news is there is no signs of active infection systemically and locally I do not see any signs of infection either. He did have an x-ray which was negative that is great news he had a culture which showed MRSA but at the same time he is been on the doxycycline which has helped. I do think we may want to extend this for 7 additional days 1/25; patient admitted to the clinic a few weeks ago. 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 We have been putting him under compression his edema control is a lot better but he is severe verricused skin on the left leg. He is really done quite well he still has an open area on the left medial calf and the left  medial first metatarsal head. We have been using silver collagen on the leg silver alginate on the foot 07/27/2020 upon evaluation today patient appears to be doing decently well in regard to his wounds. He still has a lot of dry skin on the left leg. Some of this is starting to peel back and I think he may be able to have them out by removing some that today. Fortunately there is no signs of active infection at this time on the left leg although on the right leg he does appear to have swelling and erythema as well as some mild warmth to touch. This does have been concerned about the possibility of cellulitis although within the differential diagnosis I do think that potentially a DVT has to be at least considered. We need to rule that out before proceeding would just call in the cellulitis. Especially since he is having pain in the posterior aspect of his calf muscle. 2/8; the patient had seen sparingly. He has severe skin changes of chronic lymphedema in the left leg thickened hyperkeratotic verrucous skin. He has an open wound on the medial part of the left first met head left mid tibia. He also has a rim of nonepithelialized skin in the anterior mid tibia. He brought in the AmLactin lotion that was been prescribed although I am not sure under compression and its utility. RACE, HIDER ([id number]) [id number] Page 5 of 22 There concern about cellulitis on the right lower leg the last time he was here. He was put on on antibiotics. His DVT rule out was negative. The right leg looks fine he is using his stocking on this area 08/10/2020 upon evaluation today patient appears to be doing well with regard to his leg currently. He has been tolerating the dressing changes without complication. Fortunately there is no signs of active  infection which is great news. Overall very pleased with where things stand. 2/22; the patient still has an area on the medial part of the left first  met his head. This looks better than when I last saw this earlier this month he has a rim of epithelialization but still some surface debris. Mostly everything on the left leg is healed. There is still a vulnerable in the left mid tibia area. 08/30/2020 upon evaluation today patient appears to be doing much better in regard to his wounds on his foot. 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 Nonetheless I think we will address that today I Minna send in a prescription for him in that regard. Overall though there does not appear to be any signs of significant worsening. 09/07/2020 on evaluation today patient's wounds over his left foot appear to be doing excellent. I do not see any signs of infection there is some callus buildup this can require debridement for certain but overall I feel like he is managing quite nicely. He still using the AmLactin cream which has been beneficial for him as well. 3/22; left foot wound is closed. There is no open area here. He is using ammonium lactate lotion to the lower extremities to help exfoliate dry cracked skin. He has compression stockings from [facility] in [place]. The wound on the medial part of his left first met head is healed today. READMISSION 04/12/2021 Mr. Sherlin is a patient we know fairly well he had a prolonged stay in clinic in 2019 with wounds on his left lateral and left anterior lower extremity in the setting of chronic venous insufficiency. More recently he was here earlier this year with predominantly an area on his left foot first metatarsal head plantar and he says the plantar foot broke down on its not long after we discharged him but he did not come back here. The last few months areas of broken down on his left anterior and again the left lateral lower extremity. The leg itself is very swollen  chronically enlarged a lot of hyperkeratotic dry Berry Q skin in the left lower leg. His edema extends well into the thigh. He was seen by Dr. Randie Heinz. He had ABIs on 03/02/2021 showing an ABI on the right of 1 with a TBI of 0.72 his ABI in the left at 1.09 TBI of 0.99. Monophasic and biphasic waveforms on the right. On the left monophasic waveforms were noted he went on to have an angiogram on 03/27/2021 this showed the aortic aortic and iliac segments were free of flow-limiting stenosis the left common femoral vein to evaluate the left femoral to anterior tibial artery bypass was unobstructed the bypass was patent without any areas of stenosis. We discharged the patient in bilateral juxta lite stockings but very clearly that was not sufficient to control the swelling and maintain skin integrity. He is clearly going to need compression pumps. 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 This is fortunate because he is on his feet for long periods of time. 10/27; patient comes in with our intake nurse reporting copious amount of green drainage from the left anterior mid tibia the left dorsal foot and to a lesser extent the left medial mid tibia. We left the compression wrap on all week  for the amount of edema in his left leg is quite a bit better. We use silver alginate as the primary dressing 11/3; edema control is good. Left anterior lower leg left medial lower leg and the plantar first metatarsal head. The left anterior lower leg required debridement. Deep tissue culture I did of this wound showed MRSA I put him on 10 days of doxycycline which she will start today. We have him in compression wraps. He has a security card and [employer] however he is retiring on November 15. We will need to then get him into a better offloading boot for the left foot perhaps a total contact cast 11/10; edema control is quite good. Left anterior and left medial lower leg  wounds in the setting of chronic venous insufficiency and lymphedema. He also has a substantial area over the left plantar first metatarsal head. 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 He has significant hypergranulation on the left plantar foot wound. The patient is a diabetic but he does not have significant PAD 11/17; edema control is quite good. Left anterior and left medial lower leg wounds look better. The really concerning area remains the area on the left plantar first metatarsal head. He has a rim of epithelialization. 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 Starting today with a forefoot off loader but . possibly a total contact cast. 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 Arrives with a very significant odor from I think all of the wounds on the left leg and the left foot. Because of this I did not put a total contact cast on him today but will could still consider this. His wife was having cataract surgery which is the reason he missed the appointment 12/6. I saw this man 5 days ago with a swelling below the popliteal fossa. I thought he actually might have a Baker's cyst however the DVT rule out study that we could arrange right away was negative the technician told me this was not a ruptured Baker's cyst. We attempted to get this aspirated by under ultrasound guidance in interventional radiology however all they did was an ultrasound however it shows an extensive fluid collection 62 x 8 x 9.4 in the left thigh and left calf. The patient states he thinks this started 8 days ago or so but he really is not complaining of any pain, fever or systemic symptoms. He has not  ha 12/20; after some difficulty I managed to get the patient into see Dr. Randie Heinz. Eventually he was taken into the hospital and had a drain put in the fluid collection below his left knee posteriorly extending into the posterior thigh. He still has the drain in place. Culture of this showed moderate staff aureus few Morganella and few Klebsiella he is now on doxycycline and ciprofloxacin as suggested by infectious disease he is on this for a month. The drain will remain in place until it stops draining 12/29; he comes in today with the 1 wound on his left leg and the area on the left plantar first met head significantly smaller. Both look healthy. He still has the drain in  the left leg. He says he has to change this daily. Follows up with Dr. Randie Heinz on January 11. 06/29/2021; the wounds that I am following on the left leg and left first met head continued to be quite healthy. However the area where his inferior drain is in place had copious amounts of drainage which was green in color. The wound here is larger. Follows up with Dr. Pascal Lux of vein and vascular his surgeon next week as well as infectious disease. He remains on ciprofloxacin and doxycycline. He is not complaining of excessive pain in either one of the drain areas 1/12; the patient saw vascular surgery and infectious disease. Vascular surgery has left the drain in place as there was still some notable drainage still see him back in 2 weeks. Dr. Dorthula Perfect stop the doxycycline and ciprofloxacin and I do not believe he follows up with them at this point. Culture I did last week showed both doxycycline resistant MRSA and Pseudomonas not sensitive to ciprofloxacin although only in rare titers 1/19; the patient's wound on the left anterior lower leg is just about healed. We have continued healing of the area that was medially on the left leg. Left first plantar metatarsal head continues to get smaller. The major problem here is his 2 drain sites 1 on the  left upper calf and lateral thigh. There is purulent drainage still from the left lateral thigh. I gave him antibiotics last week but we still have recultured. He has the drain in the area I think this is eventually going to have to come out. I suspect there will be a connecting wound to heal here perhaps with improved VAc 1/26; the patient had his drain removed by vein and vascular on 1/25/. This was a large pocket of fluid in his left thigh that seem to tunnel into his left upper calf. 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 His mention his Penrose drain was removed today. He now has a tunneling wound on his left calf and left thigh. Both of these probe widely towards each other although I cannot really prove that they connect. Both wounds on his lower leg anteriorly are closed and his area over the first metatarsal head on his right foot continues to improve. We are using Hydrofera Blue here. OSBURN, BABIAK ([id number]) [id number] Page 6 of 22 He also saw infectious disease culture of the abscess they noted was polymicrobial with MRSA, Morganella and Klebsiella he was treated with doxycycline and ciprofloxacin for 4 weeks ending on 07/03/2021. They did not recommend any further antibiotics. Notable that while he still had the Penrose drain in place last week he had purulent drainage coming out of the inferior IandD site this grew [place], MRSA and Pseudomonas but there does not appear to be any active infection in this area today with the drain out and he is not systemically unwell 2/2; with regards to the drain sites the superior one on the thigh actually is closed down the one on the upper left lateral calf measures about 8 and half centimeters which is an improvement seems to be less prominent although still with a lot of drainage. The only remaining wound is over the first metatarsal head on the left foot and this looks to be continuing to  improve with Hydrofera Blue. 2/9; the area on his plantar left foot continues to contract. Callus around the wound edge. The drain sites specifically have not come down in depth. We put the  wound VAC on Monday he changed the canister late last night our intake nurse reported a pocket of fluid perhaps caused by our compression wraps 2/16; continued improvement in left foot plantar wound. drainage site in the calf is not improved in terms of depth (wound vac) 2/23; continued improvement in the left foot wound over the first metatarsal head. With regards to the drain sites the area on his thigh laterally is healed however the open area on his calf is small in terms of circumference by still probes in by about 15 cm. Within using the wound VAC. Hydrofera Blue on his foot 08/24/2021: The left first metatarsal head wound continues to improve. The wound bed is healthy with just some surrounding callus. Unfortunately the open drain site on his calf remains open and tunnels at least 15 cm (the extent of a Q-tip). This is despite several weeks of wound VAC treatment. Based on reading back through the notes, there has been really no significant change in the depth of the wound, although the orifice is smaller and the more cranial wound on his thigh has closed. I suspect the tunnel tracks nearly all the way to this location. 08/31/2021: Continued improvement in the left first metatarsal head wound. There has been absolutely no improvement to the long tunnel from his open drain site on his calf. We have tried to get him into see vascular surgery sooner to consider the possibility of simply filleting the tract open and allowing it to heal from the bottom up, likely with a wound VAC. They have not yet scheduled a sooner appointment than his current mid April 09/14/2021: He was seen by vascular surgery and they took him to the operating room last week. They opened a portion of the tunnel, but did not extend the entire  length of the known open subcutaneous tract. I read Dr. Darcella Cheshire operative note and it is not clear from that documentation why only a portion of the tract was opened. The heaped up granulation tissue was curetted and removed from at least some portion of the tract. They did place a wound VAC and applied an Unna boot to the leg. The ulcer on his left first metatarsal head is smaller today. The bed looks good and there is just a small amount of surrounding callus. 09/21/2021: The ulcer on his left first metatarsal head looks to be stalled. There is some callus surrounding the wound but the wound bed itself does not appear particularly dynamic. The tunnel tract on his lateral left leg seems to be roughly the same length or perhaps slightly smaller but the wound bed appears healthy with good granulation tissue. He opened up a new wound on his medial thigh and the site of a prior surgical incision. He says that he did this unconsciously in his sleep by scratching. 09/28/2021: Unfortunately, the ulcer on his left first metatarsal head has extended underneath the callus toward the dorsum of his foot. The medial thigh wounds are roughly the same. The tunnel on his lateral left leg continues to be problematic; it is longer than we are able to actually probe with a Q-tip. I am still not certain as to why Dr. Randie Heinz did not open this up entirely when he took the patient to the operating room. We will likely be back in the same situation with just a small superficial opening in a long unhealed tract, as the open portion is granulating in nicely. 10/02/2021: The patient was initially scheduled for a nurse visit,  but we are also applying a total contact cast today. The plantar foot wound looks clean without significant accumulated callus. We have been applying Prisma silver collagen to the site. 10/05/2021: The patient is here for his first total contact cast change. We have tried using gauze packing strips in the tunnel on  his lateral leg wound, but this does not seem to be working any better than the white VAC foam. The foot ulcer looks about the same with minimal periwound callus. Medial thigh wound is clean with just some overlying eschar. 10/12/2021: The plantar foot wound is stable without any significant accumulation of periwound callus. The surface is viable with good granulation tissue. The medial thigh wounds are much smaller and are epithelializing. On the other hand, he had purulent drainage coming from the tunnel on his lateral leg. He does go back to see Dr. Randie Heinz next week and is planning to ask him why the wound tunnel was not completely opened at the time of his most recent operation. 10/19/2021: The plantar foot wound is markedly improved and has epithelial tissue coming through the surface. The medial thigh wounds are nearly closed with just a tiny open area. He did see Dr. Randie Heinz earlier this week and apparently they did discuss the possibility of opening the sinus tract further and enabling a wound VAC application. Apparently there are some limits as to what Dr. 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 I think if we could get the tract open to the level of the popliteal fossa, this would greatly aid in her ability to get this chart closed. That being said, however, today when I probed the tract with a Q-tip, I was not able to insert the entirety of the Q-tip as I have on previous occasions. The tunnel is shorter by about 4 cm. The surface is clean with good granulation tissue and no further episodes of purulent drainage. 10/30/2021: Last week, the patient underwent surgery and had the long tract in his leg opened. There was a rind that was debrided, according to the operative report. His medial thigh ulcers are closed. The plantar foot wound is clean with a good surface and some built up surrounding callus. 11/06/2021: The overall dimensions of the large wound on his  lateral leg remain about the same, but there is good granulation tissue present and the tunneling is a little bit shorter. He has a new wound on his anterior tibial surface, in the same location where he had a similar lesion in the past. The plantar foot wound is clean with some buildup surrounding callus. Just toward the medial aspect of his foot, however, there is an area of darkening that once debrided, revealed another opening in the skin surface. 11/13/2021: The anterior tibial surface wound is closed. The plantar foot wound has some surrounding callus buildup. The area of darkening that I debrided last week and revealed an opening in the skin surface has closed again. The tunnel in the large wound on his lateral leg has come in by about 3 cm. There is healthy granulation tissue on the entire wound surface. 11/23/2021: The patient was out of town last week and did wet-to-dry dressings on his large wound. 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 Unfortunately, he picked open the wound on his left medial thigh. He says that it was itching and he just could not stop scratching it until it was  open again. The wound on his plantar foot is smaller and has not accumulated a tremendous amount of callus. The lateral leg wound is shallower and the tunnel has also decreased in depth. There is just a little bit of slough accumulation on the surface. 11/30/2021: Another portion of his left medial thigh has been opened up. All of these wounds are fairly superficial with just a little bit of slough and eschar accumulation. The wound on his plantar foot is almost closed with just a bit of eschar and periwound callus accumulation. The lateral leg wound is nearly flush with the surrounding skin and the tunnel is markedly shallower. 12/07/2021: There is just 1 open area on his left medial thigh. It is clean with just a little bit of perimeter eschar. The  wound on his plantar foot continues to contract and just has some eschar and periwound callus accumulation. The lateral leg wound is closing at the more distal aspect and the tunnel is smaller. The surface is nearly flush with the surrounding skin and it has a good bed of granulation tissue. 12/14/2021: The thigh and foot wounds are closed. The lateral leg wound has closed over approximately half of its length. The tunnel continues to contract and the surface is now flush with the surrounding skin. The wound bed has robust granulation tissue. 12/22/2021: The thigh and foot wounds have reopened. The foot wound has a lot of callus accumulation around and over it. The thigh wound is tiny with just a little bit of slough in the wound bed. The lateral leg wound continues to contract. His vascular surgeon took the wound VAC off earlier in the week and the patient has been doing wet-to-dry dressings. There is a little slough accumulation on the surface. The tunnel is about 3 cm in depth at this point. SESAR, BOSSIO ([id number]) [id number] Page 7 of 22 12/28/2021: The thigh wound is closed again. The foot wound has some callus that subsequently has peeled back exposing just a small slit of a wound. The lateral leg wound Is down to about half the size that it originally was and the tunnel is down to about half a centimeter in depth. 01/04/2022: The thigh wound remains closed. The foot wound has heavy callus overlying the wound site. Once this was debrided, the wound was found to be closed. The lateral leg wound is smaller again this week and very superficial. No tunnel could be identified. 01/12/2022: The thigh and foot wounds both remain closed. The lateral leg wound is now nearly flush with the skin surface. There is good granulation tissue present with a light layer of slough. 01/19/2022: Due to the way his wrap was placed, the patient did not change the dressing on his thigh at all  and so the foam was saturated and his skin is macerated. There is a light layer of slough on the wound surface. The underlying granulation tissue is robust and healthy-appearing. He has heavy callus buildup at the site of his first metatarsal head wound which is still healed. 02/01/2022: He has been in silver alginate. When he removed the dressing from his thigh wound, however, some leg, superficially reopening a portion of the wound that had healed. In addition, underneath the callus at his left first metatarsal head, there appears to be a blister and the wound appears to be open again. 02/08/2022: The lateral leg wound has contracted substantially. There is eschar and a light layer of slough present. He says that it is starting to  pull and is uncomfortable. On inspection, there is some puckering of the scar and the eschar is quite dry; this may account for his symptoms. On his first metatarsal head, the wound is much smaller with just some eschar on the surface. The callus has not reaccumulated. He reports that he had a blister come up on his medial thigh wound at the distal aspect. It popped and there is now an opening in his skin again. Looking back through his library of wound photos, there is what looks like a permanent suture just deep to this location and it may be trying to erode through. We have been using silver alginate on his wounds. 02/15/2022: The lateral leg wound is about half the size it was last week. It is clean with just a little perimeter eschar and light slough. The wound on his first metatarsal head is about the same with heavy callus overlying it. The medial thigh wound is closed again. He does have some skin changes on the top of his foot that looks potentially yeast related. 02/22/2022: The skin on the top of his foot improved with the use of a topical antifungal. The lateral leg wound continues to contract and is again smaller this week. There is a little bit of slough and  eschar on the surface. The first metatarsal head wound is a little bit smaller but has reaccumulated a thick callus over the top. He decided to try to trim his toenail and ultimately took the entire nail off of his left great toe. 03/02/2022: His lateral leg wound continues to improve, as does the wound on his left great toe. Unfortunately, it appears that somehow his foot got wet and moisture seeped in through the opening causing his skin to lift. There is a large wound now overlying his first metatarsal on both the plantar, medial, and dorsal portion of his foot. There is necrotic tissue and slough present underneath the shaggy macerated skin. 03/08/2022: The lateral leg wound is smaller again today. There is just a light layer of slough and eschar on the surface. The great toe wound is smaller again today. The first metatarsal wound is a little bit smaller today and does not look nearly as necrotic and macerated. There is still slough and nonviable tissue present. 03/15/2022: The lateral leg wound is narrower and just has a little bit of light slough buildup. The first metatarsal wound still has a fair amount of moisture affecting the periwound skin. The great toe wound is healed. 03/22/2022: The lateral leg wound is now isolated to just at the level of his knee. There is some eschar and slough accumulation. The first metatarsal head wound has epithelialized tremendously and is about half the size that it was last week. He still has some maceration on the top of his foot and a fungal odor is present. 03/29/2022: T oday the patient's foot was macerated, suggesting that the cast got wet. The patient has also been picking at his dry skin and has enlarged the wound on his left lateral leg. In the time between having his cast removed and my evaluation, he had picked more dry skin and opened up additional wounds on his Achilles area and dorsal foot. The plantar first metatarsal head wound, however, is smaller  and clean with just macerated callus around the perimeter and light slough on the surface. The lateral leg wound measured a little bit larger but is also fairly clean with eschar and minimal slough. 04/02/2022: The patient had vascular  studies done last Friday and so his cast was not applied. He is here today to have that done. 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 04/05/2022: Both wounds are smaller and quite clean. There is just a little biofilm on the lateral leg wound. 10/20; the patient has a wound on the left lateral surgical incision at the level of his lateral knee this looks clean and improved. He is using silver alginate. He also has an area on his left medial foot for which she is using Hydrofera Blue under a total contact cast both wounds are measuring smaller 04/20/2022: The plantar foot wound has contracted considerably and is very close to closing. The lateral leg wound was measured a little larger, but there was a tiny open area that was included in the measurements that was not included last week. He has some eschar around the perimeter but otherwise the wound looks clean. 04/27/2022: The lateral leg wound looks better this week. He says that midweek, he felt it was very dry and began applying hydrogel to the site. I think this was beneficial. The foot wound is nearly closed underneath a thick layer of dry skin and callus. 05/04/2022: The foot wound is healed. He has developed a new small ulcer on his anterior tibial surface about midway up his leg. It has a little slough on the surface. The lateral leg wound still is fairly dry, but clean with just a little biofilm on the surface. 05/11/2022: The wound on his foot reopened on Wednesday. A large blister formed which then broke open revealing the fat layer underneath. The ulcer on his anterior tibial surface is a little bit larger this week. The lateral leg wound has much better moisture balance this week. Fortunately,  prior to his foot wound reopening, he did get the cast made for his orthotic. 05/15/2022: Already, the left medial foot wound has improved. The tissue is less macerated and the surface is clean. The ulcer on his anterior tibial surface continues to enlarge. This seems likely secondary to accumulated moisture. The lateral leg wound continues to have an improved moisture balance with the use of collagen. 05/25/2022: The medial foot wound continues to contract. It is now substantially smaller with just a little slough on the surface. The anterior tibial surface wound continues to enlarge further. Once again, this seems to be secondary to moisture. The lateral leg wound does not seem to be changing much in size, but the moisture balance is better. 06/01/2022: The anterior tibial wound is closed. The medial foot wound is down to just a very small, couple of millimeters, opening. The lateral leg wound has good moisture balance, but remains unchanged in size. 12/15; the patient's anterior tibial wound has reopened, however the area on his right first metatarsal head is closed. The major wound is actually on the superior part of his surgical wound in the left lateral thigh. Not a completely viable surface under illumination. This may at some point require a debridement I think he is currently using Prisma. As noted the left medial foot wound has closed 06/14/2022: The anterior tibial wound has closed. The lateral leg wound has a better surface but is basically unchanged in size. The left medial foot wound has reopened. It looks as though there was some callus accumulation and moisture got under the callus which caused the tissue to break down again. 06/21/2022: A new wound has opened up just distal to the previous anterior tibial wound. It is small but  has hypertrophic granulation tissue present. The lateral [name], [name] ([id number]) [id number] Page 8 of 22 leg wound is a little  bit narrower and has a layer of slough on the surface. The left medial foot wound is down to just a pinhole. His custom orthotics should be available next week. 06/28/2022: The wound on his first metatarsal head has healed. He has developed a new small wound on his medial lower leg, in an old scar site. The lateral leg wound continues to contract but continues to accumulate slough, as well. 07/03/2022: Despite wearing his custom orthopedic shoes, he managed to reopen the wound on his first metatarsal head. He says he thinks his foot got wet and then some skin lifted up and he peeled this away. Both of the lower leg wounds are smaller and have some dry eschar on the surface. The lateral leg wound is quite a bit narrower today. 07/12/2022: The medial lower leg wound is closed. The anterior lower leg wound has contracted considerably. The lateral upper leg wound is narrower with a layer of slough on the surface. The first metatarsal head wound is also smaller, but had copious drainage which saturated the foam border dressing and resulted in some periwound tissue maceration. Fortunately there was no breakdown at this site. 07/19/2022: The lower leg shows signs of significant maceration; I think he must be sweating excessively inside his cast. There are several areas of skin breakdown present. The wound on his foot is smaller and that on his lateral leg is narrower and is shorter by about a centimeter. 07/26/2022: Last week we used a zinc Coflex wrap prior to applying his total contact cast and this has had the effect of keeping his skin from getting macerated this week. The anterior leg wound has epithelialized substantially. The lateral leg wound is significantly smaller with just a bit of slough on the surface. The first metatarsal head wound is also smaller this week. 08/02/2022: The anterior leg wound was closed on arrival, but while he was sitting in the room, he picked it open again. The lateral leg wound is  smaller with just a little slough on the surface and the first metatarsal head wound has contracted further, as well. 08/09/2022: The first metatarsal head wound is covered with callus. Underneath the callus, it is nearly completely closed. The lateral leg wound is smaller again this week. The anterior leg wound looks better, but he has such heavy buildup of old skin, that moisture is getting underneath this, becoming trapped, and causing the underlying skin to get macerated and open up. 08/16/2022: The first metatarsal head wound is closed. The lateral leg wound continues to contract and is quite a bit smaller again this week. There is just a small, superficial opening remaining on his anterior tibial surface. 08/23/2022: The first metatarsal head wound has, by some miracle, remained closed. The lateral leg wound is substantially smaller with multiple areas of epithelialization. The anterior tibial surface wound is also quite a bit smaller and very clean. 08/30/2022: Unfortunately, his first metatarsal head wound opened up again. It happened in the same fashion as it has on prior occasions. Moisture got under dried skin/callus and created a wound when he removed his sock, taking the skin with it. The anterior tibial surface has a thick shell of hyperkeratotic skin. This has been contributing to ongoing repeat wounding events as moisture gets underneath this and causes tissue breakdown. 3/15; patient presents for follow-up. His anterior left leg wound has healed. He  still has the wound to the left lateral aspect and left first met head. We have been using silver alginate and endoform to these areas under [employer]. He has no issues or complaints today. He has been taking Augmentin and reports improvement to his symptoms to the left first met head. 09/13/2022: He has accumulated more thick dry skin in sheets on his lower leg. The lateral leg wound is about the same size and the left first metatarsal  head wound is a little bit smaller. There is slough on both surfaces. There is callus buildup around the foot wound. 09/20/2022: The lateral leg wound is a little bit narrower and the left first metatarsal head wound also seems to have contracted slightly. There is slough on both surfaces. He has a little skin breakdown on his anterior tibial surface. 09/27/2022: The lateral leg wound continues to contract and is quite clean. The first metatarsal head wound is also smaller. There is some perimeter callus and slough accumulation on the foot. The anterior tibial surface is closed. 10/04/2022: Both of his wounds are smaller today, particularly the first metatarsal head wound. 10/18/2022: He missed his appointment last week and ended up cutting off his wrap on Saturday. The anterior tibial wound reopened. It is fairly superficial with a little bit of slough on the surface. His lateral leg wound is smaller with some slough and eschar buildup. The first metatarsal head wound is also smaller with some callus and slough accumulation. 10/25/2022: All wounds are smaller. There is slough and eschar on the lateral leg and slough and callus on the plantar foot wound. The anterior tibial wound is clean and flush with the surrounding skin. No debris accumulation here. 11/01/2022: The wounds are all smaller again this week. There is slough on the lateral leg and some minimal slough and eschar on the anterior tibial wound. There is callus accumulation on the first metatarsal head site, along with slough. There is also a yeasty odor coming from the foot. 11/08/2022: The lateral leg wound is smaller except where he picked some skin while waiting to be seen. There is a little bit of slough on the surface. The anterior tibial wound is closed. The first metatarsal head site has gotten macerated once again and has a lot of spongy wet tissue and callus around it. No yeast odor today. 11/15/2022: The lateral leg wound continues to  contract. There is now a band of epithelium dividing it into 2 areas. Minimal slough accumulation. He managed to pick open a new wound on his medial lower leg. The first metatarsal head site is smaller with some callus accumulation but no tissue maceration. 11/22/2022: The lateral leg wound is smaller again by about a third today. There is a little bit of slough on the surface with some periwound eschar. The new wound that he picked open last week has healed but he picked open 3 new small wounds on his anterior tibial surface, once again due to his picking at his skin. The first metatarsal head wound looks about the same. There has been some callus accumulation and there is more edema present, as he was not put in a compression wrap last week. 11/29/2022: The lateral leg wound is smaller again today. There is a little periwound eschar and some slough present. The wounds on his anterior tibial surface are all closed except for 1 that has a little bit of slough on the open portion with eschar covering it. The first metatarsal head wound measured a  little bit smaller today, but mostly looks about the same. Edema control is better. 12/06/2022: The anterior tibial wound is closed. The lateral leg wound is smaller with some slough and eschar accumulation. The plantar foot ulcer is about the same size, but the tissue does show some evidence of the fact that he was on his feet quite a bit more this past week; there was a death in his family. There has been more callus accumulation and the tissues are little bit more purpleish. 12/13/2022: He picked the skin on his anterior tibia and reopened a wound there while he was waiting to be seen in clinic. The lateral leg wound is much smaller with a little bit of slough and eschar. The plantar foot ulcer is smaller today with callus and slough buildup, but without the pressure induced tissue injury that was seen last week. 12/20/2022: The anterior tibial wound has healed.  The lateral leg wound is smaller again this week. There is a little bit of eschar buildup on the surface. The foot had a fairly strong odor coming from it that persisted even after washing. The wound itself looks like its gotten a little smaller but he has built up thick callus, once again. 12/26/2022: The lateral leg wound is down to just a narrow superficial slit. There is slough and a little bit of dry skin present. The foot is in much better shape [name], [name] ([id number]) [id number] Page 9 of 22 today. There is less callus accumulation and no odor. The skin edges are starting to roll inward, however. 01/03/2023: The lateral leg wound is healed. The skin is quite dry but the wound has closed. The foot continues to contract. He has a fair amount of periwound callus accumulation and the surface of the is a little drier than ideal. 01/10/2023: The large lateral leg wound remains closed. He managed to pick off some of his dry skin and has multiple small open superficial wounds on his lower leg. The foot wound measures smaller, but he has a blister immediately adjacent to it. 01/17/2023: The large lateral leg wound remains closed. The small superficial wounds on his lower leg have also closed. The foot wound is about the same and the blister area immediately adjacent to it has dark eschar on the surface. The foot wound looks a bit dry. 01/24/2023: He picked at some dry skin on his lateral leg wound and reopened. The surface is dry and fibrotic. The foot wound is slightly smaller but has periwound callus and the edges are rolling inward again. 01/31/2023: His lateral leg wound is larger again today. It appears that the Ace bandage may be rubbing and causing some tissue breakdown. His foot wound is also a bit larger and the tissue does not appear particularly healthy. 02/07/2023: The lateral leg wound improved quite significantly over the last week. He has been applying additional  gauze over the site before applying the Ace bandage and this seems to have minimized the rubbing. The foot wound is about the same. The culture that I took last week returned with a polymicrobial population of predominantly skin flora. He is currently taking Augmentin as recommended by the culture data. 02/14/2023: The lateral leg wound is covered in a layer of eschar. Underneath, the wound is nearly healed again. The foot wound looks better today. The depth around the edges has decreased. The tissue has a healthier appearance. He is still taking Augmentin. 02/21/2023: The lateral leg wound is smaller with a layer of eschar  on the surface, but is not quite healed. The foot wound is stable but the depth around the edges is almost completely obliterated. There is some callus and senescent skin around the edges. 03/07/2023: The lateral leg wound is down to just a pinhole with a little bit of eschar on the surface. The foot wound demonstrates evidence that he has been walking excessively the past week and there is more callus with moisture underneath it, but the breakdown is limited to the callus layer and there is still intact skin underneath. 03/14/2023: The lateral leg wound is closed. The foot wound looks much improved this week. There is very minimal callus accumulation around the wound edges and the tissue surface appears healthier. It seems the adjustments made to his shoe by the [facility] were effective. 03/28/2023: The lateral leg wound has reopened. The patient reports that he was not moisturizing it as much as he probably should have been and it cracked and some skin peeled away. The foot wound shows signs of pressure-related trauma with some bruising and increased callus. The patient admits to extensive walking this past week while taking his wife to medical appointments. He does have a knee scooter and it is unclear why he does not use it. 04/04/2023: The lateral leg wound is larger again  today. The patient reports that he thinks maybe some dry skin pulled off when he was changing his dressing. It is quite dry and he states that he is using just water to moisten his endoform, rather than the hydrogel that was recommended. The foot wound is the same size, but the tissue is in better condition, without substantial bruising or friction-related trauma. 04/11/2023: Both wounds look better this week. Much of the lateral leg wound has epithelialized. The moisture balance is much better. The foot wound looks better than it has in months. The granulation tissue is flush with the surrounding skin and there is not much callus accumulation. 04/25/2023: The patient was out of town last week. The lateral leg ulcer has good moisture balance, but has opened up a little bit more at the most proximal aspect. The foot ulcer has a layer of callus around the outside but is otherwise fairly clean with minimal slough buildup. 05/02/2023: He has 2 new wounds today. He has picked at the skin on his left anterior tibial surface and opened up the wound location. He picked a callus on his medial foot adjacent to his existing metatarsal head wound and opened up the site there. The lateral leg wound looks a little bit better this week and the moisture balance is excellent. The metatarsal head wound has a little bit of breakdown at the more proximal end but otherwise is also making improvement. 05/09/2023: Both foot ulcers are smaller and look better today. The lower leg ulcer is also smaller. The lateral leg ulcer is basically the same size. All of the wounds have slough on the surface and there is some callus accumulation around the plantar first metatarsal head wound. 05/16/2023: The smaller of the foot ulcers is nearly healed. The plantar foot ulcer is stable. The lower leg ulcer is smaller. The lateral leg ulcer is a little bit smaller and the moisture balance is good. There is slough on all of the wound  surfaces. 06/13/2023: The small, more dorsal foot wounds have epithelialized. The plantar foot ulcer is stable in size, but the tissue appears healthier. The lower leg ulcer is about the same size. The lateral leg ulcer is larger  today. He reports that he had it nearly healed and the skin peeled off again as it got dry. 06/20/2023: The smaller more dorsal foot wounds are open again, but they are tiny. The plantar foot ulcer is stable, but the surface is looking a little bit dry. The lower leg ulcer is unchanged. The lateral leg ulcer is a little bit smaller today, but he has an area just distal to the wound where he says some tape pulled off, resulting in a wound. No concern for infection at any of the locations. Electronic Signature(s) Signed: 06/20/2023 10:51:24 AM By: [name] MD FACS Entered By: [name] on 06/20/2023 07:46:53 -------------------------------------------------------------------------------- Physical Exam Details Patient Name: Date of Service: [name] Grapes. 06/20/2023 10:00 A M Medical Record Number: [id number] Patient Account Number: [id number] Date of Birth/Sex: Treating RN: [birth year] (72 y.o. M) Primary Care Provider: [name] Other Clinician: Referring Provider: Treating Provider/Extender: [name] in Treatment: [address], [place] ([id number]) [id number] Page 10 of 22 Constitutional . Bradycardic, asymptomatic. . . no acute distress. Respiratory Normal work of breathing on room air.. Notes 06/20/2023: The smaller more dorsal foot wounds are open again, but they are tiny. The plantar foot ulcer is stable, but the surface is looking a little bit dry. The lower leg ulcer is unchanged. The lateral leg ulcer is a little bit smaller today, but he has an area just distal to the wound where he says some tape pulled off, resulting in a wound. No concern for infection at any of the  locations. Electronic Signature(s) Signed: 06/20/2023 10:51:24 AM By: [name] MD FACS Entered By: [name] on 06/20/2023 07:47:27 -------------------------------------------------------------------------------- Physician Orders Details Patient Name: Date of Service: [name] Grapes. 06/20/2023 10:00 A M Medical Record Number: [id number] Patient Account Number: [id number] Date of Birth/Sex: Treating RN: Dec 26, 1950 (72 y.o. 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 - Dr. Lady Gary Return A 1/2 @ 745am Anesthetic (In clinic) Topical Lidocaine 4% applied to wound bed Bathing/ Shower/ Hygiene May shower and wash wound with soap and water. Edema Control - Orders / Instructions Elevate legs to the level of the heart or above for 30 minutes daily and/or when sitting for 3-4 times a day throughout the day. Avoid standing for long periods of time. Patient to wear own compression stockings every day. Moisturize legs daily. Off-Loading Wound #18R Left,Plantar Metatarsal head first Other: - custom diabetic shoes Additional Orders / Instructions Follow Nutritious Diet - vitamin C 500 mg 3 times a day and zinc  30-50 mg a day Wound Treatment Wound #18R - Metatarsal head first Wound Laterality: Plantar, Left Cleanser: Normal Saline 1 x Per Day/30 Days Discharge Instructions: Cleanse the wound with Normal Saline prior to applying a clean dressing using gauze sponges, not tissue or cotton balls. Cleanser: Soap and Water 1 x Per Day/30 Days [name], [name] ([id number]) [id number] Page  11 of 22 Discharge Instructions: May shower and wash wound with dial antibacterial soap and water prior to dressing change. Cleanser: Wound Cleanser 1 x Per Day/30 Days Discharge Instructions: Cleanse the wound with wound cleanser prior to applying a clean dressing using gauze sponges, not tissue or cotton balls. Topical: Skintegrity Hydrogel 4 (oz) 1 x Per Day/30 Days Discharge Instructions: Apply hydrogel as directed Prim Dressing: Promogran Prisma Matrix, 4.34 (sq in) (silver collagen) 1 x Per Day/30 Days ary Discharge Instructions: Moisten collagen with saline or hydrogel Secondary Dressing: Optifoam Non-Adhesive Dressing, 4x4 in (Generic) 1 x Per Day/30 Days Discharge Instructions: Apply over primary dressing as directed. Secondary Dressing: Woven Gauze Sponges 2x2 in (Generic) 1 x Per Day/30 Days Discharge Instructions: Apply over primary dressing as directed. Secured With: 85M Medipore H Soft Cloth Surgical T ape, 4 x 10 (in/yd) 1 x Per Day/30 Days Discharge Instructions: Secure with tape as directed. Wound #22R - Lower Leg Wound Laterality: Left, Lateral, Proximal Cleanser: Soap and Water 1 x Per Day/30 Days Discharge Instructions: May shower and wash wound with dial antibacterial soap and water prior to dressing change. Cleanser: Wound Cleanser 1 x Per Day/30 Days Discharge Instructions: Cleanse the wound with wound cleanser prior to applying a clean dressing using gauze sponges, not tissue or cotton balls. Topical: Skintegrity Hydrogel 4 (oz) 1 x Per Day/30 Days Discharge  Instructions: Apply hydrogel as directed Prim Dressing: Promogran Prisma Matrix, 4.34 (sq in) (silver collagen) 1 x Per Day/30 Days ary Discharge Instructions: Moisten collagen with saline or hydrogel Secondary Dressing: Woven Gauze Sponge, Non-Sterile 4x4 in 1 x Per Day/30 Days Discharge Instructions: Apply over primary dressing as directed. Secured With: 85M Medipore H Soft Cloth Surgical T ape, 4 x 10 (in/yd) 1 x Per Day/30 Days Discharge Instructions: Secure with tape as directed. Wound #36 - Lower Leg Wound Laterality: Left, Anterior Cleanser: Soap and Water 1 x Per Day/30 Days Discharge Instructions: May shower and wash wound with dial antibacterial soap and water prior to dressing change. Cleanser: Wound Cleanser 1 x Per Day/30 Days Discharge Instructions: Cleanse the wound with wound cleanser prior to applying a clean dressing using gauze sponges, not tissue or cotton balls. Prim Dressing: Maxorb Extra Ag+ Alginate Dressing, 2x2 (in/in) 1 x Per Day/30 Days ary Discharge Instructions: Apply to wound bed as instructed Secondary Dressing: Zetuvit Plus Silicone Border Dressing 4x4 (in/in) 1 x Per Day/30 Days Discharge Instructions: Apply silicone border over primary dressing as directed. Patient Medications llergies: No Known Drug Allergies A Notifications Medication Indication Start End 06/20/2023 lidocaine DOSE topical 5 % ointment - ointment topical once daily Electronic Signature(s) Signed: 06/20/2023 10:51:24 AM By: [name] MD FACS Entered By: [name] on 06/20/2023 07:47:45 Leifheit, Dola Factor ([id number]) [id number] Page 12 of 22 -------------------------------------------------------------------------------- Problem List Details Patient Name: Date of Service: [name], [name] 06/20/2023 10:00 A M Medical Record Number: [id number] Patient Account Number: [id number] Date of Birth/Sex: Treating RN: Nov 18, 1950 (72 y.o. 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 03/28/2023 No Yes L97.828 Non-pressure chronic ulcer of other part of left lower leg with other specified 05/02/2023 No Yes severity I89.0 Lymphedema, not elsewhere classified 04/12/2021 No Yes I87.322 Chronic venous  hypertension (idiopathic) with inflammation of left lower 04/12/2021 No Yes extremity L85.9 Epidermal thickening, unspecified 08/30/2022 No Yes E11.51 Type 2 diabetes mellitus with diabetic peripheral angiopathy without gangrene 04/12/2021 No Yes Inactive Problems ICD-10 Code Description Active Date Inactive Date E11.621 Type 2 diabetes mellitus with foot ulcer 04/12/2021 04/12/2021 E11.42 Type 2 diabetes mellitus with diabetic polyneuropathy 04/12/2021 04/12/2021 L02.416 Cutaneous abscess of left lower limb 06/13/2021 06/13/2021 Resolved Problems ICD-10 Code Description Active Date Resolved Date L97.828 Non-pressure chronic ulcer of other part of left lower leg with other specified severity 01/10/2023 06/08/2022 Electronic Signature(s) Signed: 06/20/2023 10:51:24 AM By: [name] MD FACS Entered By: [name] on 06/20/2023 07:45:02 [name] ([id number]) [id number] Page 13 of 22 -------------------------------------------------------------------------------- Progress Note Details Patient Name: Date of Service: [name], [name] 06/20/2023 10:00 A M Medical Record Number: [id number] Patient Account Number: [id number] Date of Birth/Sex: Treating RN: 1950-08-11 (72 y.o. M) Primary Care Provider: [name] Other  Clinician: Referring Provider: Treating Provider/Extender: [name] in Treatment: 114 Subjective Chief Complaint Information obtained from Patient Left leg and foot ulcers 04/12/2021; patient is here for wounds on his left lower leg and left plantar foot over the first metatarsal head History of Present Illness (HPI) 10/11/17; Mr. Okereke is a 72 year old man who tells me that in 2015 he slipped down the latter traumatizing his left leg. He developed a wound in the same spot the area that we are currently looking at. He states this closed over for the most part although he always felt it was somewhat unstable. In 2016 he hit the same area with the door of his car had this reopened. He tells me that this is never really closed although sometimes an inflow it remains open on a constant basis. He has not been using any specific dressing to this except for topical antibiotics the nature of which were not really sure. His primary doctor did send him to see Dr. Jacinto Halim of interventional cardiology. He underwent an angiogram on 08/06/17 and he underwent a PTA and directional atherectomy of the lesser distal SFA and popliteal arteries which resulted in brisk improvement in blood flow. It was noted that he had 2 vessel runoff through the anterior tibial and peroneal. He is also been to see vascular and interventional radiologist. He was not felt to have any significant superficial venous insufficiency. Presumably is not a candidate for any ablation. It was suggested he come here for wound care. The patient is a type II diabetic on insulin. He also has a history of venous insufficiency. ABIs on the left were noncompressible in our clinic 10/21/17; patient we admitted to the clinic last week. He has a fairly large chronic ulcer on the left lateral calf in the setting of chronic venous insufficiency. We put Iodosorb on him after an aggressive debridement and 3 layer compression. He  complained of pain in his ankle and itching with is skin in fact he scratched the area on the medial calf superiorly at the rim of our wraps and he has 2 small open areas in that location today which are new. I changed his primary dressing today to silver collagen. As noted he is already had revascularization and does not have any significant superficial venous insufficiency that would be amenable to ablation 10/28/17; patient admitted to the clinic 2 weeks ago. He has a smaller Wound. Scratch injury from last week revealed. There is large wound over the tibial area. This is smaller. Granulation looks healthy. No  need for debridement. 11/04/17; the wound on the left lateral calf looks better. Improved dimensions. Surface of this looks better. We've been maintaining him and Kerlix Coban wraps. He finds this much more comfortable. Silver collagen dressing 11/11/17; left lateral Wound continues to look healthy be making progress. Using a #5 curet I removed removed nonviable skin from the surface of the wound and then necrotic debris from the wound surface. Surface of the wound continues to look healthy. He also has an open area on the left great toenail bed. We've been using topical antibiotics. 11/19/17; left anterior lateral wound continues to look healthy but it's not closed. He also had a small wound above this on the left leg Initially traumatic wounds in the setting of significant chronic venous insufficiency and stasis dermatitis 11/25/17; left anterior wounds superiorly is closed still a small wound inferiorly. 12/02/17; left anterior tibial area. Arrives today with adherent callus. Post debridement clearly not completely closed. Hydrofera Blue under 3 layer compression. 12/09/17; left anterior tibia. Circumferential eschar however the wound bed looks stable to improved. We've been using Hydrofera Blue under 3 layer compression 12/17/17; left anterior tibia. Apparently this was felt to be closed however  when the wrap was taken off there is a skin tear to reopen wounds in the same area we've been using Hydrofera Blue under 3 layer compression 12/23/17 left anterior tibia. Not close to close this week apparently the [facility] was stuck to this again. Still circumferential eschar requiring debridement. I put a contact layer on this this time under the Hydrofera Blue 12/31/17; left anterior tibia. Wound is better slight amount of hyper-granulation. Using Hydrofera Blue over Adaptic. 01/07/18; left anterior tibia. The wound had some surface eschar however after this was removed he has no open wound.he was already revascularized by Dr. Jacinto Halim when he came to our clinic with atherectomy of the left SFA and popliteal artery. He was also sent to interventional radiology for venous reflux studies. He was not felt to have significant reflux but certainly has chronic venous changes of his skin with hemosiderin deposition around this area. He will definitely need to lubricate his skin and wear compression stocking and I've talked to him about this. READMISSION 05/26/2018 This is a now 72 year old man we cared for with traumatic wounds on his left anterior lower extremity. He had been previously revascularized during that admission by Dr. Jacinto Halim. Apparently in follow-up Dr. Jacinto Halim noted that he had deterioration in his arterial status. He underwent a stent placement in the distal left SFA on 04/22/2018. 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 He went back to the angiography suite on 04/30/2018 he underwent PTA and balloon angioplasty of the occluded left mid anterior tibial artery, thrombotic occlusion went from 100 to 0% which reconstitutes the posterior tibial artery. He had thrombectomy and aspiration of the peroneal artery. The stent placed in the distal SFA left SFA was still occluded. He was discharged on Xarelto, it was noted on the discharge summary from this hospitalization that he had  gangrene at the tip of his left fifth toe and there were expectations this would auto amputate. 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 He has been recuperating at [facility] in [facility] after the most recent hospitalization. He is going home tomorrow. He tells me that 2 weeks ago he traumatized the tip of his left fifth toe. He came in urgently for our review of this. This  was a history of before I noted that Dr. Jacinto Halim had already noted dry gangrenous changes of the left fifth toe 06/09/2018; 2-week follow-up. I did contact Dr. Jacinto Halim after his last appointment and he apparently saw 1 of Dr. Verl Dicker colleagues the next day. He does not follow-up with Dr. Jacinto Halim himself until Thursday of this week. 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 Nevertheless there is no evidence of infection no drainage and no pain. He had a new area that this week when we were signing him in today on the left anterior mid tibia area, this is in close proximity to the previous wound we have dealt with in this clinic. 06/23/2018; 2-week follow-up. I did not receive a recent note from Dr. Jacinto Halim to review today. Our office is trying to obtain this. He is apparently not planning to do further vascular interventions and wondered about compression to try and help with the patient's chronic venous insufficiency. However we are also concerned about the arterial flow. He arrives in clinic today with a new area on the left third toe. The areas on the calf/anterior tibia are close to closing. The left fifth toe is still mummified [name], [name] ([id number]) [id number] Page 14 of 22 using Betadine. -In reviewing things with the patient he has what sounds like claudication with mild to moderate amount of activity. 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 I prescribed Levaquin over the phone  while we attempted to arrange a plan of care. However the patient called yesterday to report he had low-grade fever and he came in today acutely. There is been a marked deterioration in the left third toe with spreading cellulitis up into the dorsal left foot. He was referred to the emergency room. Readmission: 06/29/2020 patient presents today for reevaluation here in our clinic he was previously treated by Dr. Leanord Hawking at the latter part of 2019 in 2 the beginning of 2020. Subsequently we have not seen him since that time in the interim he did have evaluation with vein and vascular specialist specifically Dr. Bo Mcclintock who did perform quite extensive work for a left femoral to anterior tibial artery bypass. With that being said in the interim the patient has developed significant lymphedema and has wounds that he tells me have really never healed in regard to the incision site on the left leg. He also has multiple wounds on the feet for various reasons some of which is that he tends to pick at his feet. Fortunately there is no signs of active infection systemically at this time he does have some wounds that are little bit deeper but most are fairly superficial he seems to have good blood flow and overall everything appears to be healthy I see no bone exposed and no obvious signs of osteomyelitis. I do not know that he necessarily needs a x-ray at this point although that something we could consider depending on how things progress. The patient does have a history of lymphedema, diabetes, this is type II, chronic kidney disease stage III, hypertension, and history of peripheral vascular disease. 07/05/2020; patient admitted last week. Is a patient I remember from 2019 he had a spreading infection involving the left foot and we sent him to the hospital. 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 He subsequently had a left femoral to anterior tibial bypass by Dr.Cain  vein and vascular. He  also has severe lymphedema with chronic skin changes related to that on the left leg. The most problematic area that was new today was on the left medial great toe. This was apparently a small area last week there was purulent drainage which our intake nurse cultured. Also areas on the left medial foot and heel left lateral foot. He has 2 areas on the left medial calf left lateral calf in the setting of the severe lymphedema. 07/13/2020 on evaluation today patient appears to be doing better in my opinion compared to his last visit. The good news is there is no signs of active infection systemically and locally I do not see any signs of infection either. He did have an x-ray which was negative that is great news he had a culture which showed MRSA but at the same time he is been on the doxycycline which has helped. I do think we may want to extend this for 7 additional days 1/25; patient admitted to the clinic a few weeks ago. 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 We have been putting him under compression his edema control is a lot better but he is severe verricused skin on the left leg. He is really done quite well he still has an open area on the left medial calf and the left medial first metatarsal head. We have been using silver collagen on the leg silver alginate on the foot 07/27/2020 upon evaluation today patient appears to be doing decently well in regard to his wounds. He still has a lot of dry skin on the left leg. Some of this is starting to peel back and I think he may be able to have them out by removing some that today. Fortunately there is no signs of active infection at this time on the left leg although on the right leg he does appear to have swelling and erythema as well as some mild warmth to touch. This does have been concerned about the possibility of cellulitis although within the differential diagnosis I do think  that potentially a DVT has to be at least considered. We need to rule that out before proceeding would just call in the cellulitis. Especially since he is having pain in the posterior aspect of his calf muscle. 2/8; the patient had seen sparingly. He has severe skin changes of chronic lymphedema in the left leg thickened hyperkeratotic verrucous skin. He has an open wound on the medial part of the left first met head left mid tibia. He also has a rim of nonepithelialized skin in the anterior mid tibia. He brought in the AmLactin lotion that was been prescribed although I am not sure under compression and its utility. There concern about cellulitis on the right lower leg the last time he was here. He was put on on antibiotics. His DVT rule out was negative. The right leg looks fine he is using his stocking on this area 08/10/2020 upon evaluation today patient appears to be doing well with regard to his leg currently. He has been tolerating the dressing changes without complication. Fortunately there is no signs of active infection which is great news. Overall very pleased with where things stand. 2/22; the patient still has an area on the medial part of the left first met his head. This looks better than when I last saw this earlier this month he has a rim of epithelialization but still some surface debris. Mostly everything on the left leg  is healed. There is still a vulnerable in the left mid tibia area. 08/30/2020 upon evaluation today patient appears to be doing much better in regard to his wounds on his foot. 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 Nonetheless I think we will address that today I Minna send in a prescription for him in that regard. Overall though there does not appear to be any signs of significant worsening. 09/07/2020 on evaluation today  patient's wounds over his left foot appear to be doing excellent. I do not see any signs of infection there is some callus buildup this can require debridement for certain but overall I feel like he is managing quite nicely. He still using the AmLactin cream which has been beneficial for him as well. 3/22; left foot wound is closed. There is no open area here. He is using ammonium lactate lotion to the lower extremities to help exfoliate dry cracked skin. He has compression stockings from elastic therapy in [name]. The wound on the medial part of his left first met head is healed today. READMISSION 04/12/2021 Mr. Hallum is a patient we know fairly well he had a prolonged stay in clinic in 2019 with wounds on his left lateral and left anterior lower extremity in the setting of chronic venous insufficiency. More recently he was here earlier this year with predominantly an area on his left foot first metatarsal head plantar and he says the plantar foot broke down on its not long after we discharged him but he did not come back here. The last few months areas of broken down on his left anterior and again the left lateral lower extremity. The leg itself is very swollen chronically enlarged a lot of hyperkeratotic dry Berry Q skin in the left lower leg. His edema extends well into the thigh. He was seen by Dr. Randie Heinz. He had ABIs on 03/02/2021 showing an ABI on the right of 1 with a TBI of 0.72 his ABI in the left at 1.09 TBI of 0.99. Monophasic and biphasic waveforms on the right. On the left monophasic waveforms were noted he went on to have an angiogram on 03/27/2021 this showed the aortic aortic and iliac segments were free of flow-limiting stenosis the left common femoral vein to evaluate the left femoral to anterior tibial artery bypass was unobstructed the bypass was patent without any areas of stenosis. We discharged the patient in bilateral juxta lite stockings but very clearly that was not sufficient  to control the swelling and maintain skin integrity. He is clearly going to need compression pumps. 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 This is fortunate because he is on his feet for long periods of time. 10/27; patient comes in with our intake nurse reporting copious amount of green drainage from the left anterior mid tibia the left dorsal foot and to a lesser extent the left medial mid tibia. We left the compression wrap on all week for the amount of edema in his left leg is quite a bit better. We use silver alginate as the primary dressing 11/3; edema control is good. Left anterior lower leg left medial lower leg and the plantar first metatarsal head. The left anterior lower leg required debridement. Deep tissue culture I did of this wound showed MRSA I  put him on 10 days of doxycycline which she will start today. We have him in compression wraps. He has a security card and [employer] however he is retiring on November 15. We will need to then get him into a better offloading boot for the left foot perhaps a total contact cast 11/10; edema control is quite good. Left anterior and left medial lower leg wounds in the setting of chronic venous insufficiency and lymphedema. He also has a substantial area over the left plantar first metatarsal head. 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 He has significant hypergranulation on the left plantar foot wound. The patient is a diabetic but he does not have significant PAD 11/17; edema control is quite good. Left anterior and left medial lower leg wounds look better. The really concerning area remains the area on the left plantar [name], [name] ([id number]) [id number] Page 15 of 22 first metatarsal head. He has a rim of epithelialization. He has been using a surgical shoe The  patient is now retired from a a [employer] I have gone over with him the need to offload this area aggressively. Starting today with a forefoot off loader but . possibly a total contact cast. 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 Arrives with a very significant odor from I think all of the wounds on the left leg and the left foot. Because of this I did not put a total contact cast on him today but will could still consider this. His wife was having cataract surgery which is the reason he missed the appointment 12/6. I saw this man 5 days ago with a swelling below the popliteal fossa. I thought he actually might have a Baker's cyst however the DVT rule out study that we could arrange right away was negative the technician told me this was not a ruptured Baker's cyst. We attempted to get this aspirated by under ultrasound guidance in interventional radiology however all they did was an ultrasound however it shows an extensive fluid collection 62 x 8 x 9.4 in the left thigh and left calf. The patient states he thinks this started 8 days ago or so but he really is not complaining of any pain, fever or systemic symptoms. He has not ha 12/20; after some difficulty I managed to get the patient into see Dr. Randie Heinz. Eventually he was taken into the hospital and had a drain put in the fluid collection below his left knee posteriorly extending into the posterior thigh. He still has the drain in place. Culture of this showed moderate staff aureus few Morganella and few Klebsiella he is now on doxycycline and ciprofloxacin as suggested by infectious disease he is on this for a month. The drain will remain in place until it stops draining 12/29; he comes in today with the 1 wound on his left leg and the area on the left plantar first met head significantly smaller. Both look healthy. He still has  the drain in the left leg. He says he has to change this daily. Follows up with Dr. Randie Heinz on January 11. 06/29/2021; the wounds that I am following on the left leg and left first met head continued to be quite healthy. However the area where his inferior drain is in place  had copious amounts of drainage which was green in color. The wound here is larger. Follows up with Dr. Pascal Lux of vein and vascular his surgeon next week as well as infectious disease. He remains on ciprofloxacin and doxycycline. He is not complaining of excessive pain in either one of the drain areas 1/12; the patient saw vascular surgery and infectious disease. Vascular surgery has left the drain in place as there was still some notable drainage still see him back in 2 weeks. Dr. Dorthula Perfect stop the doxycycline and ciprofloxacin and I do not believe he follows up with them at this point. Culture I did last week showed both doxycycline resistant MRSA and Pseudomonas not sensitive to ciprofloxacin although only in rare titers 1/19; the patient's wound on the left anterior lower leg is just about healed. We have continued healing of the area that was medially on the left leg. Left first plantar metatarsal head continues to get smaller. The major problem here is his 2 drain sites 1 on the left upper calf and lateral thigh. There is purulent drainage still from the left lateral thigh. I gave him antibiotics last week but we still have recultured. He has the drain in the area I think this is eventually going to have to come out. I suspect there will be a connecting wound to heal here perhaps with improved VAc 1/26; the patient had his drain removed by vein and vascular on 1/25/. This was a large pocket of fluid in his left thigh that seem to tunnel into his left upper calf. 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 His mention his Penrose drain was removed today. He now has a tunneling wound on his left calf and left thigh. Both  of these probe widely towards each other although I cannot really prove that they connect. Both wounds on his lower leg anteriorly are closed and his area over the first metatarsal head on his right foot continues to improve. We are using Hydrofera Blue here. He also saw infectious disease culture of the abscess they noted was polymicrobial with MRSA, Morganella and Klebsiella he was treated with doxycycline and ciprofloxacin for 4 weeks ending on 07/03/2021. They did not recommend any further antibiotics. Notable that while he still had the Penrose drain in place last week he had purulent drainage coming out of the inferior IandD site this grew [facility], MRSA and Pseudomonas but there does not appear to be any active infection in this area today with the drain out and he is not systemically unwell 2/2; with regards to the drain sites the superior one on the thigh actually is closed down the one on the upper left lateral calf measures about 8 and half centimeters which is an improvement seems to be less prominent although still with a lot of drainage. The only remaining wound is over the first metatarsal head on the left foot and this looks to be continuing to improve with Hydrofera Blue. 2/9; the area on his plantar left foot continues to contract. Callus around the wound edge. The drain sites specifically have not come down in depth. We put the wound VAC on Monday he changed the canister late last night our intake nurse reported a pocket of fluid perhaps caused by our compression wraps 2/16; continued improvement in left foot plantar wound. drainage site in the calf is not improved in terms of depth (wound vac) 2/23; continued improvement in the left foot wound over the first metatarsal head.  With regards to the drain sites the area on his thigh laterally is healed however the open area on his calf is small in terms of circumference by still probes in by about 15 cm. Within using the wound VAC.  Hydrofera Blue on his foot 08/24/2021: The left first metatarsal head wound continues to improve. The wound bed is healthy with just some surrounding callus. Unfortunately the open drain site on his calf remains open and tunnels at least 15 cm (the extent of a Q-tip). This is despite several weeks of wound VAC treatment. Based on reading back through the notes, there has been really no significant change in the depth of the wound, although the orifice is smaller and the more cranial wound on his thigh has closed. I suspect the tunnel tracks nearly all the way to this location. 08/31/2021: Continued improvement in the left first metatarsal head wound. There has been absolutely no improvement to the long tunnel from his open drain site on his calf. We have tried to get him into see vascular surgery sooner to consider the possibility of simply filleting the tract open and allowing it to heal from the bottom up, likely with a wound VAC. They have not yet scheduled a sooner appointment than his current mid April 09/14/2021: He was seen by vascular surgery and they took him to the operating room last week. They opened a portion of the tunnel, but did not extend the entire length of the known open subcutaneous tract. I read Dr. Darcella Cheshire operative note and it is not clear from that documentation why only a portion of the tract was opened. The heaped up granulation tissue was curetted and removed from at least some portion of the tract. They did place a wound VAC and applied an Unna boot to the leg. The ulcer on his left first metatarsal head is smaller today. The bed looks good and there is just a small amount of surrounding callus. 09/21/2021: The ulcer on his left first metatarsal head looks to be stalled. There is some callus surrounding the wound but the wound bed itself does not appear particularly dynamic. The tunnel tract on his lateral left leg seems to be roughly the same length or perhaps slightly smaller  but the wound bed appears healthy with good granulation tissue. He opened up a new wound on his medial thigh and the site of a prior surgical incision. He says that he did this unconsciously in his sleep by scratching. 09/28/2021: Unfortunately, the ulcer on his left first metatarsal head has extended underneath the callus toward the dorsum of his foot. The medial thigh wounds are roughly the same. The tunnel on his lateral left leg continues to be problematic; it is longer than we are able to actually probe with a Q-tip. I am still not certain as to why Dr. Randie Heinz did not open this up entirely when he took the patient to the operating room. We will likely be back in the same situation with just a small superficial opening in a long unhealed tract, as the open portion is granulating in nicely. 10/02/2021: The patient was initially scheduled for a nurse visit, but we are also applying a total contact cast today. The plantar foot wound looks clean without significant accumulated callus. We have been applying Prisma silver collagen to the site. 10/05/2021: The patient is here for his first total contact cast change. We have tried using gauze packing strips in the tunnel on his lateral leg wound, but this  does not seem to be working any better than the white VAC foam. The foot ulcer looks about the same with minimal periwound callus. Medial thigh wound is clean with just some overlying eschar. 10/12/2021: The plantar foot wound is stable without any significant accumulation of periwound callus. The surface is viable with good granulation tissue. The LUXTON, SALONEN ([id number]) [id number] Page 16 of 22 medial thigh wounds are much smaller and are epithelializing. On the other hand, he had purulent drainage coming from the tunnel on his lateral leg. He does go back to see Dr. Randie Heinz next week and is planning to ask him why the wound tunnel was not completely opened at the time of his most  recent operation. 10/19/2021: The plantar foot wound is markedly improved and has epithelial tissue coming through the surface. The medial thigh wounds are nearly closed with just a tiny open area. He did see Dr. Randie Heinz earlier this week and apparently they did discuss the possibility of opening the sinus tract further and enabling a wound VAC application. Apparently there are some limits as to what Dr. 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 I think if we could get the tract open to the level of the popliteal fossa, this would greatly aid in her ability to get this chart closed. That being said, however, today when I probed the tract with a Q-tip, I was not able to insert the entirety of the Q-tip as I have on previous occasions. The tunnel is shorter by about 4 cm. The surface is clean with good granulation tissue and no further episodes of purulent drainage. 10/30/2021: Last week, the patient underwent surgery and had the long tract in his leg opened. There was a rind that was debrided, according to the operative report. His medial thigh ulcers are closed. The plantar foot wound is clean with a good surface and some built up surrounding callus. 11/06/2021: The overall dimensions of the large wound on his lateral leg remain about the same, but there is good granulation tissue present and the tunneling is a little bit shorter. He has a new wound on his anterior tibial surface, in the same location where he had a similar lesion in the past. The plantar foot wound is clean with some buildup surrounding callus. Just toward the medial aspect of his foot, however, there is an area of darkening that once debrided, revealed another opening in the skin surface. 11/13/2021: The anterior tibial surface wound is closed. The plantar foot wound has some surrounding callus buildup. The area of darkening that I debrided last week and revealed an opening in the skin surface has closed  again. The tunnel in the large wound on his lateral leg has come in by about 3 cm. There is healthy granulation tissue on the entire wound surface. 11/23/2021: The patient was out of town last week and did wet-to-dry dressings on his large wound. 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 Unfortunately, he picked open the wound on his left medial thigh. He says that it was itching and he just could not stop scratching it until it was open again. The wound on his plantar foot is smaller and has not accumulated a tremendous amount of callus. The lateral leg wound is shallower and the tunnel has also decreased in depth. There is just a little bit of slough accumulation on the surface. 11/30/2021: Another portion of his  left medial thigh has been opened up. All of these wounds are fairly superficial with just a little bit of slough and eschar accumulation. The wound on his plantar foot is almost closed with just a bit of eschar and periwound callus accumulation. The lateral leg wound is nearly flush with the surrounding skin and the tunnel is markedly shallower. 12/07/2021: There is just 1 open area on his left medial thigh. It is clean with just a little bit of perimeter eschar. The wound on his plantar foot continues to contract and just has some eschar and periwound callus accumulation. The lateral leg wound is closing at the more distal aspect and the tunnel is smaller. The surface is nearly flush with the surrounding skin and it has a good bed of granulation tissue. 12/14/2021: The thigh and foot wounds are closed. The lateral leg wound has closed over approximately half of its length. The tunnel continues to contract and the surface is now flush with the surrounding skin. The wound bed has robust granulation tissue. 12/22/2021: The thigh and foot wounds have reopened. The foot wound has a lot of callus accumulation around and over it. The  thigh wound is tiny with just a little bit of slough in the wound bed. The lateral leg wound continues to contract. His vascular surgeon took the wound VAC off earlier in the week and the patient has been doing wet-to-dry dressings. There is a little slough accumulation on the surface. The tunnel is about 3 cm in depth at this point. 12/28/2021: The thigh wound is closed again. The foot wound has some callus that subsequently has peeled back exposing just a small slit of a wound. The lateral leg wound Is down to about half the size that it originally was and the tunnel is down to about half a centimeter in depth. 01/04/2022: The thigh wound remains closed. The foot wound has heavy callus overlying the wound site. Once this was debrided, the wound was found to be closed. The lateral leg wound is smaller again this week and very superficial. No tunnel could be identified. 01/12/2022: The thigh and foot wounds both remain closed. The lateral leg wound is now nearly flush with the skin surface. There is good granulation tissue present with a light layer of slough. 01/19/2022: Due to the way his wrap was placed, the patient did not change the dressing on his thigh at all and so the foam was saturated and his skin is macerated. There is a light layer of slough on the wound surface. The underlying granulation tissue is robust and healthy-appearing. He has heavy callus buildup at the site of his first metatarsal head wound which is still healed. 02/01/2022: He has been in silver alginate. When he removed the dressing from his thigh wound, however, some leg, superficially reopening a portion of the wound that had healed. In addition, underneath the callus at his left first metatarsal head, there appears to be a blister and the wound appears to be open again. 02/08/2022: The lateral leg wound has contracted substantially. There is eschar and a light layer of slough present. He says that it is starting to pull and  is uncomfortable. On inspection, there is some puckering of the scar and the eschar is quite dry; this may account for his symptoms. On his first metatarsal head, the wound is much smaller with just some eschar on the surface. The callus has not reaccumulated. He reports that he had a blister come up on his medial  thigh wound at the distal aspect. It popped and there is now an opening in his skin again. Looking back through his library of wound photos, there is what looks like a permanent suture just deep to this location and it may be trying to erode through. We have been using silver alginate on his wounds. 02/15/2022: The lateral leg wound is about half the size it was last week. It is clean with just a little perimeter eschar and light slough. The wound on his first metatarsal head is about the same with heavy callus overlying it. The medial thigh wound is closed again. He does have some skin changes on the top of his foot that looks potentially yeast related. 02/22/2022: The skin on the top of his foot improved with the use of a topical antifungal. The lateral leg wound continues to contract and is again smaller this week. There is a little bit of slough and eschar on the surface. The first metatarsal head wound is a little bit smaller but has reaccumulated a thick callus over the top. He decided to try to trim his toenail and ultimately took the entire nail off of his left great toe. 03/02/2022: His lateral leg wound continues to improve, as does the wound on his left great toe. Unfortunately, it appears that somehow his foot got wet and moisture seeped in through the opening causing his skin to lift. There is a large wound now overlying his first metatarsal on both the plantar, medial, and dorsal portion of his foot. There is necrotic tissue and slough present underneath the shaggy macerated skin. 03/08/2022: The lateral leg wound is smaller again today. There is just a light layer of slough and  eschar on the surface. The great toe wound is smaller again today. The first metatarsal wound is a little bit smaller today and does not look nearly as necrotic and macerated. There is still slough and nonviable tissue present. 03/15/2022: The lateral leg wound is narrower and just has a little bit of light slough buildup. The first metatarsal wound still has a fair amount of moisture affecting the periwound skin. The great toe wound is healed. 03/22/2022: The lateral leg wound is now isolated to just at the level of his knee. There is some eschar and slough accumulation. The first metatarsal head wound has epithelialized tremendously and is about half the size that it was last week. He still has some maceration on the top of his foot and a fungal odor is present. 03/29/2022: T oday the patient's foot was macerated, suggesting that the cast got wet. The patient has also been picking at his dry skin and has enlarged the wound on his left lateral leg. In the time between having his cast removed and my evaluation, he had picked more dry skin and opened up additional wounds on his Achilles area and dorsal foot. The plantar first metatarsal head wound, however, is smaller and clean with just macerated callus around the perimeter and light slough on the surface. The lateral leg wound measured a little bit larger but is also fairly clean with eschar and minimal slough. KING, PARRADO ([id number]) [id number] Page 17 of 22 04/02/2022: The patient had vascular studies done last Friday and so his cast was not applied. He is here today to have that done. 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 04/05/2022: Both wounds are smaller and quite clean. There is just a little biofilm on the lateral leg wound. 10/20; the patient has  a wound on the left lateral surgical incision at the level of his lateral knee this looks clean and improved. He is using silver alginate. He also has  an area on his left medial foot for which she is using Hydrofera Blue under a total contact cast both wounds are measuring smaller 04/20/2022: The plantar foot wound has contracted considerably and is very close to closing. The lateral leg wound was measured a little larger, but there was a tiny open area that was included in the measurements that was not included last week. He has some eschar around the perimeter but otherwise the wound looks clean. 04/27/2022: The lateral leg wound looks better this week. He says that midweek, he felt it was very dry and began applying hydrogel to the site. I think this was beneficial. The foot wound is nearly closed underneath a thick layer of dry skin and callus. 05/04/2022: The foot wound is healed. He has developed a new small ulcer on his anterior tibial surface about midway up his leg. It has a little slough on the surface. The lateral leg wound still is fairly dry, but clean with just a little biofilm on the surface. 05/11/2022: The wound on his foot reopened on Wednesday. A large blister formed which then broke open revealing the fat layer underneath. The ulcer on his anterior tibial surface is a little bit larger this week. The lateral leg wound has much better moisture balance this week. Fortunately, prior to his foot wound reopening, he did get the cast made for his orthotic. 05/15/2022: Already, the left medial foot wound has improved. The tissue is less macerated and the surface is clean. The ulcer on his anterior tibial surface continues to enlarge. This seems likely secondary to accumulated moisture. The lateral leg wound continues to have an improved moisture balance with the use of collagen. 05/25/2022: The medial foot wound continues to contract. It is now substantially smaller with just a little slough on the surface. The anterior tibial surface wound continues to enlarge further. Once again, this seems to be secondary to moisture. The lateral leg  wound does not seem to be changing much in size, but the moisture balance is better. 06/01/2022: The anterior tibial wound is closed. The medial foot wound is down to just a very small, couple of millimeters, opening. The lateral leg wound has good moisture balance, but remains unchanged in size. 12/15; the patient's anterior tibial wound has reopened, however the area on his right first metatarsal head is closed. The major wound is actually on the superior part of his surgical wound in the left lateral thigh. Not a completely viable surface under illumination. This may at some point require a debridement I think he is currently using Prisma. As noted the left medial foot wound has closed 06/14/2022: The anterior tibial wound has closed. The lateral leg wound has a better surface but is basically unchanged in size. The left medial foot wound has reopened. It looks as though there was some callus accumulation and moisture got under the callus which caused the tissue to break down again. 06/21/2022: A new wound has opened up just distal to the previous anterior tibial wound. It is small but has hypertrophic granulation tissue present. The lateral leg wound is a little bit narrower and has a layer of slough on the surface. The left medial foot wound is down to just a pinhole. His custom orthotics should be available next week. 06/28/2022: The wound on his first metatarsal head  has healed. He has developed a new small wound on his medial lower leg, in an old scar site. The lateral leg wound continues to contract but continues to accumulate slough, as well. 07/03/2022: Despite wearing his custom orthopedic shoes, he managed to reopen the wound on his first metatarsal head. He says he thinks his foot got wet and then some skin lifted up and he peeled this away. Both of the lower leg wounds are smaller and have some dry eschar on the surface. The lateral leg wound is quite a bit narrower today. 07/12/2022: The  medial lower leg wound is closed. The anterior lower leg wound has contracted considerably. The lateral upper leg wound is narrower with a layer of slough on the surface. The first metatarsal head wound is also smaller, but had copious drainage which saturated the foam border dressing and resulted in some periwound tissue maceration. Fortunately there was no breakdown at this site. 07/19/2022: The lower leg shows signs of significant maceration; I think he must be sweating excessively inside his cast. There are several areas of skin breakdown present. The wound on his foot is smaller and that on his lateral leg is narrower and is shorter by about a centimeter. 07/26/2022: Last week we used a zinc Coflex wrap prior to applying his total contact cast and this has had the effect of keeping his skin from getting macerated this week. The anterior leg wound has epithelialized substantially. The lateral leg wound is significantly smaller with just a bit of slough on the surface. The first metatarsal head wound is also smaller this week. 08/02/2022: The anterior leg wound was closed on arrival, but while he was sitting in the room, he picked it open again. The lateral leg wound is smaller with just a little slough on the surface and the first metatarsal head wound has contracted further, as well. 08/09/2022: The first metatarsal head wound is covered with callus. Underneath the callus, it is nearly completely closed. The lateral leg wound is smaller again this week. The anterior leg wound looks better, but he has such heavy buildup of old skin, that moisture is getting underneath this, becoming trapped, and causing the underlying skin to get macerated and open up. 08/16/2022: The first metatarsal head wound is closed. The lateral leg wound continues to contract and is quite a bit smaller again this week. There is just a small, superficial opening remaining on his anterior tibial surface. 08/23/2022: The first  metatarsal head wound has, by some miracle, remained closed. The lateral leg wound is substantially smaller with multiple areas of epithelialization. The anterior tibial surface wound is also quite a bit smaller and very clean. 08/30/2022: Unfortunately, his first metatarsal head wound opened up again. It happened in the same fashion as it has on prior occasions. Moisture got under dried skin/callus and created a wound when he removed his sock, taking the skin with it. The anterior tibial surface has a thick shell of hyperkeratotic skin. This has been contributing to ongoing repeat wounding events as moisture gets underneath this and causes tissue breakdown. 3/15; patient presents for follow-up. His anterior left leg wound has healed. He still has the wound to the left lateral aspect and left first met head. We have been using silver alginate and endoform to these areas under [employer]. He has no issues or complaints today. He has been taking Augmentin and reports improvement to his symptoms to the left first met head. 09/13/2022: He has accumulated more thick dry  skin in sheets on his lower leg. The lateral leg wound is about the same size and the left first metatarsal head wound is a little bit smaller. There is slough on both surfaces. There is callus buildup around the foot wound. 09/20/2022: The lateral leg wound is a little bit narrower and the left first metatarsal head wound also seems to have contracted slightly. There is slough on both surfaces. He has a little skin breakdown on his anterior tibial surface. 09/27/2022: The lateral leg wound continues to contract and is quite clean. The first metatarsal head wound is also smaller. There is some perimeter callus and slough accumulation on the foot. The anterior tibial surface is closed. AMARO, OUTEN ([id number]) [id number] Page 18 of 22 10/04/2022: Both of his wounds are smaller today, particularly the first metatarsal  head wound. 10/18/2022: He missed his appointment last week and ended up cutting off his wrap on Saturday. The anterior tibial wound reopened. It is fairly superficial with a little bit of slough on the surface. His lateral leg wound is smaller with some slough and eschar buildup. The first metatarsal head wound is also smaller with some callus and slough accumulation. 10/25/2022: All wounds are smaller. There is slough and eschar on the lateral leg and slough and callus on the plantar foot wound. The anterior tibial wound is clean and flush with the surrounding skin. No debris accumulation here. 11/01/2022: The wounds are all smaller again this week. There is slough on the lateral leg and some minimal slough and eschar on the anterior tibial wound. There is callus accumulation on the first metatarsal head site, along with slough. There is also a yeasty odor coming from the foot. 11/08/2022: The lateral leg wound is smaller except where he picked some skin while waiting to be seen. There is a little bit of slough on the surface. The anterior tibial wound is closed. The first metatarsal head site has gotten macerated once again and has a lot of spongy wet tissue and callus around it. No yeast odor today. 11/15/2022: The lateral leg wound continues to contract. There is now a band of epithelium dividing it into 2 areas. Minimal slough accumulation. He managed to pick open a new wound on his medial lower leg. The first metatarsal head site is smaller with some callus accumulation but no tissue maceration. 11/22/2022: The lateral leg wound is smaller again by about a third today. There is a little bit of slough on the surface with some periwound eschar. The new wound that he picked open last week has healed but he picked open 3 new small wounds on his anterior tibial surface, once again due to his picking at his skin. The first metatarsal head wound looks about the same. There has been some callus accumulation  and there is more edema present, as he was not put in a compression wrap last week. 11/29/2022: The lateral leg wound is smaller again today. There is a little periwound eschar and some slough present. The wounds on his anterior tibial surface are all closed except for 1 that has a little bit of slough on the open portion with eschar covering it. The first metatarsal head wound measured a little bit smaller today, but mostly looks about the same. Edema control is better. 12/06/2022: The anterior tibial wound is closed. The lateral leg wound is smaller with some slough and eschar accumulation. The plantar foot ulcer is about the same size, but the tissue does show some evidence of  the fact that he was on his feet quite a bit more this past week; there was a death in his family. There has been more callus accumulation and the tissues are little bit more purpleish. 12/13/2022: He picked the skin on his anterior tibia and reopened a wound there while he was waiting to be seen in clinic. The lateral leg wound is much smaller with a little bit of slough and eschar. The plantar foot ulcer is smaller today with callus and slough buildup, but without the pressure induced tissue injury that was seen last week. 12/20/2022: The anterior tibial wound has healed. The lateral leg wound is smaller again this week. There is a little bit of eschar buildup on the surface. The foot had a fairly strong odor coming from it that persisted even after washing. The wound itself looks like its gotten a little smaller but he has built up thick callus, once again. 12/26/2022: The lateral leg wound is down to just a narrow superficial slit. There is slough and a little bit of dry skin present. The foot is in much better shape today. There is less callus accumulation and no odor. The skin edges are starting to roll inward, however. 01/03/2023: The lateral leg wound is healed. The skin is quite dry but the wound has closed. The foot  continues to contract. He has a fair amount of periwound callus accumulation and the surface of the is a little drier than ideal. 01/10/2023: The large lateral leg wound remains closed. He managed to pick off some of his dry skin and has multiple small open superficial wounds on his lower leg. The foot wound measures smaller, but he has a blister immediately adjacent to it. 01/17/2023: The large lateral leg wound remains closed. The small superficial wounds on his lower leg have also closed. The foot wound is about the same and the blister area immediately adjacent to it has dark eschar on the surface. The foot wound looks a bit dry. 01/24/2023: He picked at some dry skin on his lateral leg wound and reopened. The surface is dry and fibrotic. The foot wound is slightly smaller but has periwound callus and the edges are rolling inward again. 01/31/2023: His lateral leg wound is larger again today. It appears that the Ace bandage may be rubbing and causing some tissue breakdown. His foot wound is also a bit larger and the tissue does not appear particularly healthy. 02/07/2023: The lateral leg wound improved quite significantly over the last week. He has been applying additional gauze over the site before applying the Ace bandage and this seems to have minimized the rubbing. The foot wound is about the same. The culture that I took last week returned with a polymicrobial population of predominantly skin flora. He is currently taking Augmentin as recommended by the culture data. 02/14/2023: The lateral leg wound is covered in a layer of eschar. Underneath, the wound is nearly healed again. The foot wound looks better today. The depth around the edges has decreased. The tissue has a healthier appearance. He is still taking Augmentin. 02/21/2023: The lateral leg wound is smaller with a layer of eschar on the surface, but is not quite healed. The foot wound is stable but the depth around the edges is almost  completely obliterated. There is some callus and senescent skin around the edges. 03/07/2023: The lateral leg wound is down to just a pinhole with a little bit of eschar on the surface. The foot wound demonstrates evidence that  he has been walking excessively the past week and there is more callus with moisture underneath it, but the breakdown is limited to the callus layer and there is still intact skin underneath. 03/14/2023: The lateral leg wound is closed. The foot wound looks much improved this week. There is very minimal callus accumulation around the wound edges and the tissue surface appears healthier. It seems the adjustments made to his shoe by the [facility] were effective. 03/28/2023: The lateral leg wound has reopened. The patient reports that he was not moisturizing it as much as he probably should have been and it cracked and some skin peeled away. The foot wound shows signs of pressure-related trauma with some bruising and increased callus. The patient admits to extensive walking this past week while taking his wife to medical appointments. He does have a knee scooter and it is unclear why he does not use it. 04/04/2023: The lateral leg wound is larger again today. The patient reports that he thinks maybe some dry skin pulled off when he was changing his dressing. It is quite dry and he states that he is using just water to moisten his endoform, rather than the hydrogel that was recommended. The foot wound is the same size, but the tissue is in better condition, without substantial bruising or friction-related trauma. 04/11/2023: Both wounds look better this week. Much of the lateral leg wound has epithelialized. The moisture balance is much better. The foot wound looks better than it has in months. The granulation tissue is flush with the surrounding skin and there is not much callus accumulation. 04/25/2023: The patient was out of town last week. The lateral leg ulcer has good  moisture balance, but has opened up a little bit more at the most proximal aspect. The foot ulcer has a layer of callus around the outside but is otherwise fairly clean with minimal slough buildup. 05/02/2023: He has 2 new wounds today. He has picked at the skin on his left anterior tibial surface and opened up the wound location. He picked a callus on his medial foot adjacent to his existing metatarsal head wound and opened up the site there. The lateral leg wound looks a little bit better this week and the moisture [name], MARINOFF ([id number]) [id number] Page 19 of 22 balance is excellent. The metatarsal head wound has a little bit of breakdown at the more proximal end but otherwise is also making improvement. 05/09/2023: Both foot ulcers are smaller and look better today. The lower leg ulcer is also smaller. The lateral leg ulcer is basically the same size. All of the wounds have slough on the surface and there is some callus accumulation around the plantar first metatarsal head wound. 05/16/2023: The smaller of the foot ulcers is nearly healed. The plantar foot ulcer is stable. The lower leg ulcer is smaller. The lateral leg ulcer is a little bit smaller and the moisture balance is good. There is slough on all of the wound surfaces. 06/13/2023: The small, more dorsal foot wounds have epithelialized. The plantar foot ulcer is stable in size, but the tissue appears healthier. The lower leg ulcer is about the same size. The lateral leg ulcer is larger today. He reports that he had it nearly healed and the skin peeled off again as it got dry. 06/20/2023: The smaller more dorsal foot wounds are open again, but they are tiny. The plantar foot ulcer is stable, but the surface is looking a little bit dry. The lower leg  ulcer is unchanged. The lateral leg ulcer is a little bit smaller today, but he has an area just distal to the wound where he says some tape pulled off, resulting  in a wound. No concern for infection at any of the locations. Objective Constitutional Bradycardic, asymptomatic. no acute distress. Vitals Time Taken: 10:09 AM, Height: 74 in, Weight: 238 lbs, BMI: 30.6, Temperature: 98.5 F, Pulse: 53 bpm, Respiratory Rate: 18 breaths/min, Blood Pressure: 135/69 mmHg, Capillary Blood Glucose: 349 mg/dl. Respiratory Normal work of breathing on room air.. General Notes: 06/20/2023: The smaller more dorsal foot wounds are open again, but they are tiny. The plantar foot ulcer is stable, but the surface is looking a little bit dry. The lower leg ulcer is unchanged. The lateral leg ulcer is a little bit smaller today, but he has an area just distal to the wound where he says some tape pulled off, resulting in a wound. No concern for infection at any of the locations. Integumentary (Hair, Skin) Wound #18R status is Open. Original cause of wound was Gradually Appeared. The date acquired was: 08/23/2020. The wound has been in treatment 114 weeks. The wound is located on the Left,Plantar Metatarsal head first. The wound measures 2cm length x 2.2cm width x 0.2cm depth; 3.456cm^2 area and 0.691cm^3 volume. There is Fat Layer (Subcutaneous Tissue) exposed. There is no tunneling or undermining noted. There is a medium amount of serosanguineous drainage noted. The wound margin is epibole. There is large (67-100%) red granulation within the wound bed. There is a small (1-33%) amount of necrotic tissue within the wound bed including Adherent Slough. The periwound skin appearance had no abnormalities noted for moisture. The periwound skin appearance had no abnormalities noted for color. The periwound skin appearance exhibited: Callus. Periwound temperature was noted as No Abnormality. Wound #22R status is Open. Original cause of wound was Bump. The date acquired was: 06/03/2021. The wound has been in treatment 106 weeks. The wound is located on the Left,Proximal,Lateral Lower Leg.  The wound measures 1.5cm length x 0.9cm width x 0.1cm depth; 1.06cm^2 area and 0.106cm^3 volume. There is Fat Layer (Subcutaneous Tissue) exposed. There is no tunneling or undermining noted. There is a medium amount of serous drainage noted. The wound margin is distinct with the outline attached to the wound base. There is large (67-100%) red granulation within the wound bed. There is no necrotic tissue within the wound bed. The periwound skin appearance had no abnormalities noted for color. The periwound skin appearance exhibited: Scarring, Dry/Scaly. The periwound skin appearance did not exhibit: Maceration. Periwound temperature was noted as No Abnormality. Wound #36 status is Open. Original cause of wound was Skin T ear/Laceration. The date acquired was: 05/02/2023. The wound has been in treatment 7 weeks. The wound is located on the Left,Anterior Lower Leg. The wound measures 1.1cm length x 0.7cm width x 0.1cm depth; 0.605cm^2 area and 0.06cm^3 volume. There is Fat Layer (Subcutaneous Tissue) exposed. There is no tunneling or undermining noted. There is a medium amount of serous drainage noted. The wound margin is distinct with the outline attached to the wound base. There is medium (34-66%) red, pink granulation within the wound bed. There is a medium (34-66%) amount of necrotic tissue within the wound bed including Adherent Slough. The periwound skin appearance had no abnormalities noted for texture. The periwound skin appearance had no abnormalities noted for moisture. The periwound skin appearance exhibited: Hemosiderin Staining. Periwound temperature was noted as No Abnormality. 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 There was a Excisional Skin/Subcutaneous Tissue Debridement with a total area of 3.45 sq cm performed by [name], MD. With the following instrument(s): Curette to remove Non-Viable tissue/material. Material removed includes Subcutaneous Tissue, Slough, Skin: Dermis, and Skin: Epidermis after achieving pain control using Lidocaine 5% topical ointment. No specimens were taken. A time out was conducted at 10:27, prior to the start of the procedure. A Minimum amount of bleeding was controlled with Pressure. The procedure was tolerated well. Post Debridement Measurements: 2cm length x 2.2cm width x 0.2cm depth; 0.691cm^3 volume. Character of Wound/Ulcer Post Debridement is improved. Severity of Tissue Post Debridement is: Fat layer exposed. Post procedure Diagnosis Wound #18R: Same as Pre-Procedure Wound #22R Pre-procedure diagnosis of Wound #22R is a Cyst located on the Left,Proximal,Lateral Lower Leg . There was a Excisional Skin/Subcutaneous Tissue Debridement with a total area of 1.06 sq cm performed by [name], MD. With the following instrument(s): Curette Material removed includes Eschar, Subcutaneous Tissue, and Slough after achieving pain control using Lidocaine 5% topical ointment. No specimens were taken. A time out was conducted at 10:27, prior to the start of the procedure. A Minimum amount of bleeding was controlled with Pressure. The procedure was tolerated well. Post  Debridement Measurements: 1.5cm length x 0.9cm width x 0.1cm depth; 0.106cm^3 volume. Character of Wound/Ulcer Post Debridement is improved. Post procedure Diagnosis Wound #22R: Same as Pre-Procedure Wound #36 Pre-procedure diagnosis of Wound #36 is a Diabetic Wound/Ulcer of the Lower Extremity located on the Left,Anterior Lower Leg .Severity of Tissue Pre Debridement is: Fat layer exposed. There was a Excisional Skin/Subcutaneous Tissue Debridement with a total area of 0.6 sq cm performed by [name], MD. With the following instrument(s): Curette Material removed includes Subcutaneous Tissue and Slough and after achieving pain control using Lidocaine 5% topical ointment. No specimens were taken. A time out was conducted at 10:27, prior to the start of the procedure. A Minimum amount of bleeding was controlled with Pressure. The procedure was tolerated well. Post Debridement Measurements: 1.1cm length x 0.7cm width x 0.1cm depth; 0.06cm^3 volume. Character of Wound/Ulcer Post Debridement is improved. Severity of Tissue Post Debridement is: Fat layer exposed. Post procedure Diagnosis Wound #36: Same as Pre-Procedure Plan Follow-up Appointments: Return Appointment in 1 week. - Dr. Lady Gary 1/2 @ 745am Anesthetic: (In clinic) Topical Lidocaine 4% applied to wound bed Bathing/ Shower/ Hygiene: May shower and wash wound with soap and water. Edema Control - Orders / Instructions: Elevate legs to the level of the heart or above for 30 minutes daily and/or when sitting for 3-4 times a day throughout the day. Avoid standing for long periods of time. Patient to wear own compression stockings every day. Moisturize legs daily. Off-Loading: Wound #18R Left,Plantar Metatarsal head first: Other: - custom diabetic shoes Additional Orders / Instructions: Follow Nutritious Diet - vitamin C 500 mg 3 times a day and zinc 30-50 mg  a day The following medication(s) was prescribed: lidocaine topical 5 %  ointment ointment topical once daily was prescribed at facility WOUND #18R: - Metatarsal head first Wound Laterality: Plantar, Left Cleanser: Normal Saline 1 x Per Day/30 Days Discharge Instructions: Cleanse the wound with Normal Saline prior to applying a clean dressing using gauze sponges, not tissue or cotton balls. Cleanser: Soap and Water 1 x Per Day/30 Days Discharge Instructions: May shower and wash wound with dial antibacterial soap and water prior to dressing change. Cleanser: Wound Cleanser 1 x Per Day/30 Days Discharge Instructions: Cleanse the wound with wound cleanser prior to applying a clean dressing using gauze sponges, not tissue or cotton balls. Topical: Skintegrity Hydrogel 4 (oz) 1 x Per Day/30 Days Discharge Instructions: Apply hydrogel as directed Prim Dressing: Promogran Prisma Matrix, 4.34 (sq in) (silver collagen) 1 x Per Day/30 Days ary Discharge Instructions: Moisten collagen with saline or hydrogel Secondary Dressing: Optifoam Non-Adhesive Dressing, 4x4 in (Generic) 1 x Per Day/30 Days Discharge Instructions: Apply over primary dressing as directed. Secondary Dressing: Woven Gauze Sponges 2x2 in (Generic) 1 x Per Day/30 Days Discharge Instructions: Apply over primary dressing as directed. Secured With: 56M Medipore H Soft Cloth Surgical T ape, 4 x 10 (in/yd) 1 x Per Day/30 Days Discharge Instructions: Secure with tape as directed. WOUND #22R: - Lower Leg Wound Laterality: Left, Lateral, Proximal Cleanser: Soap and Water 1 x Per Day/30 Days Discharge Instructions: May shower and wash wound with dial antibacterial soap and water prior to dressing change. Cleanser: Wound Cleanser 1 x Per Day/30 Days Discharge Instructions: Cleanse the wound with wound cleanser prior to applying a clean dressing using gauze sponges, not tissue or cotton balls. Topical: Skintegrity Hydrogel 4 (oz) 1 x Per Day/30 Days Discharge Instructions: Apply hydrogel as directed Prim Dressing:  Promogran Prisma Matrix, 4.34 (sq in) (silver collagen) 1 x Per Day/30 Days ary Discharge Instructions: Moisten collagen with saline or hydrogel Secondary Dressing: Woven Gauze Sponge, Non-Sterile 4x4 in 1 x Per Day/30 Days Discharge Instructions: Apply over primary dressing as directed. Secured With: 56M Medipore H Soft Cloth Surgical T ape, 4 x 10 (in/yd) 1 x Per Day/30 Days Discharge Instructions: Secure with tape as directed. WOUND #36: - Lower Leg Wound Laterality: Left, Anterior Cleanser: Soap and Water 1 x Per Day/30 Days Discharge Instructions: May shower and wash wound with dial antibacterial soap and water prior to dressing change. Cleanser: Wound Cleanser 1 x Per Day/30 Days Discharge Instructions: Cleanse the wound with wound cleanser prior to applying a clean dressing using gauze sponges, not tissue or cotton balls. Prim Dressing: Maxorb Extra Ag+ Alginate Dressing, 2x2 (in/in) 1 x Per Day/30 Days ary Discharge Instructions: Apply to wound bed as instructed RAJEEV, WALLETT ([id number]) [id number] Page 21 of 22 Secondary Dressing: Zetuvit Plus Silicone Border Dressing 4x4 (in/in) 1 x Per Day/30 Days Discharge Instructions: Apply silicone border over primary dressing as directed. 06/20/2023: The smaller more dorsal foot wounds are open again, but they are tiny. The plantar foot ulcer is stable, but the surface is looking a little bit dry. The lower leg ulcer is unchanged. The lateral leg ulcer is a little bit smaller today, but he has an area just distal to the wound where he says some tape pulled off, resulting in a wound. No concern for infection at any of the locations. I used a curette to debride the slough and subcutaneous tissue off of the foot wound areas. We have been using Prisma silver collagen here,  moistened with saline. I think we may achieve better moisture balance if we use hydrogel to moisten the product. I debrided slough and subcutaneous  tissue from the anterior tibial wound. Continue silver alginate. I debrided slough and subcutaneous tissue from the wounds on his lateral leg. Continue Prisma silver collagen here, as well. He is wearing his own compression stocking and changing his own dressings daily. He will follow-up in 1 week's time. Electronic Signature(s) Signed: 06/20/2023 10:51:24 AM By: [name] MD FACS Entered By: [name] on 06/20/2023 07:49:01 -------------------------------------------------------------------------------- SuperBill Details Patient Name: Date of Service: [name] Grapes. 06/20/2023 Medical Record Number: [id number] Patient Account Number: [id number] Date of Birth/Sex: Treating RN: 1951-01-25 (72 y.o. 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 L97.828 Non-pressure chronic ulcer of other part of left lower leg with other specified severity I89.0 Lymphedema, not elsewhere classified I87.322 Chronic venous hypertension (idiopathic) with inflammation of left lower extremity L85.9 Epidermal thickening, unspecified E11.51 Type 2 diabetes mellitus with diabetic peripheral angiopathy without gangrene Facility Procedures : CPT4 Code: [id number] Description: 11042 - DEB SUBQ TISSUE 20 SQ CM/< ICD-10 Diagnosis Description L97.528 Non-pressure chronic ulcer of other part of left foot with other specified sever L97.128 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 L97.828  Non-pressure chronic ulcer of other part of left lower leg with other specified E11.51 Type 2 diabetes mellitus with diabetic peripheral angiopathy without  gangrene Modifier: ity severity Quantity: 1 Physician Procedures : CPT4 Code Description Modifier [id number] 11042 - WC PHYS SUBQ TISS 20 SQ CM ICD-10 Diagnosis Description 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 with  other specified severity L97.828 Non-pressure chronic ulcer of other part of left lower leg with other specified severity E11.51 Type 2 diabetes mellitus with diabetic peripheral angiopathy without gangrene Quantity: 1 Electronic Signature(s) Signed: 06/20/2023 10:51:24 AM By: [name] MD FACS [name] ([id number]) [id number] Page 22 of 22 Signed: 06/20/2023 10:51:24 AM By: [name] MD FACS Entered By: [name] on 06/20/2023 07:49:29

## 2023-06-20 NOTE — Progress Notes (Signed)
[name], [name] ([id number]) [id number] Page 1 of 10 Visit Report for 06/20/2023 Arrival Information Details Patient Name: Date of Service: [name], [name] 06/20/2023 10:00 A M Medical Record Number: [id number] Patient Account Number: [id number] Date of Birth/Sex: Treating RN: 04/08/1951 (72 y.o. [name] Primary Care [name]: [name] Other Clinician: Referring [name]: Treating [name]/Extender: [name] in Treatment: 114 Visit Information History Since Last Visit Added or deleted any medications: No Patient Arrived: Ambulatory Any new allergies or adverse reactions: No Arrival Time: 10:07 Had a fall or experienced change in No Accompanied By: self activities of daily living that may affect Transfer Assistance: None risk of falls: Patient Identification Verified: Yes Signs or symptoms of abuse/neglect since last visito No Secondary Verification Process Completed: Yes Hospitalized since last visit: No Patient Requires Transmission-Based Precautions: No Implantable device outside of the clinic excluding No Patient Has Alerts: Yes cellular tissue based products placed in the center since last visit: Has Dressing in Place as Prescribed: Yes Has Compression in Place as Prescribed: Yes Pain Present Now: Yes Electronic Signature(s) Signed: 06/20/2023 3:45:54 PM By: Redmond Pulling RN, BSN Entered By: Redmond Pulling on 06/20/2023 07:09:22 -------------------------------------------------------------------------------- Encounter Discharge Information Details Patient Name: Date of Service: [name]. 06/20/2023 10:00 A M Medical Record Number: [id number] Patient Account Number: [id number] Date of Birth/Sex: Treating RN: [birth year] (72 y.o. [name] Primary Care [name]: [name] Other Clinician: Referring [name]: Treating [name]/Extender: [name] in Treatment:  114 Encounter Discharge Information Items Post Procedure Vitals Discharge Condition: Stable Temperature (F): 98.5 Ambulatory Status: Ambulatory Pulse (bpm): 53 Discharge Destination: Home Respiratory Rate (breaths/min): 18 Transportation: Private Auto Blood Pressure (mmHg): 135/69 Accompanied By: self Schedule Follow-up Appointment: Yes Clinical Summary of Care: Patient Declined Electronic Signature(s) Signed: 06/20/2023 3:45:54 PM By: Redmond Pulling RN, BSN Entered By: Redmond Pulling on 06/20/2023 07:46:46 [name] ([id number]) [id number] Page 2 of 10 -------------------------------------------------------------------------------- Lower Extremity Assessment Details Patient Name: Date of Service: [name], [name] 06/20/2023 10:00 A M Medical Record Number: [id number] Patient Account Number: [id number] Date of Birth/Sex: Treating RN: 03/31/1951 (72 y.o. [name] Primary Care Lindbergh Winkles: [name] Other Clinician: Referring [name]: Treating [name]/Extender: [name] in Treatment: 114 Edema Assessment Assessed: [name]: No] [name]: No] Edema: [Left: Ye] [Right: s] Calf Left: Right: Point of Measurement: 41 cm From Medial Instep 45 cm Ankle Left: Right: Point of Measurement: 10 cm From Medial Instep 27.5 cm Vascular Assessment Pulses: Dorsalis Pedis Palpable: [Left:Yes] Extremity colors, hair growth, and conditions: Extremity Color: [Left:Hyperpigmented] Hair Growth on Extremity: [Left:No] Temperature of Extremity: [Left:Warm] Capillary Refill: [Left:< 3 seconds] Dependent Rubor: [Left:No No] Electronic Signature(s) Signed: 06/20/2023 3:45:54 PM By: Redmond Pulling RN, BSN Entered By: Redmond Pulling on 06/20/2023 07:17:59 -------------------------------------------------------------------------------- Multi Wound Chart Details Patient Name: Date of Service: [name]. 06/20/2023 10:00 A  M Medical Record Number: [id number] Patient Account Number: [id number] Date of Birth/Sex: Treating RN: 04-30-1951 (72 y.o. M) Primary Care [name]: [name] Other Clinician: Referring [name]: Treating [name]/Extender: [name] in Treatment: 114 Vital Signs Height(in): 74 Capillary Blood Glucose(mg/dl): 010 Weight(lbs): 932 Pulse(bpm): 53 Body Mass Index(BMI): 30.6 Blood Pressure(mmHg): 135/69 Temperature(F): 98.5 Respiratory Rate(breaths/min): 18 [18R:Photos:] [[id number] Page 3 of 10] Left, Plantar Metatarsal head first Left, Proximal, Lateral Lower Leg Left, Anterior Lower Leg Wound Location: Gradually Appeared Bump Skin T ear/Laceration Wounding Event: 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 Primary  Etiology: Extremity Extremity Glaucoma, Sleep Apnea, Glaucoma, Sleep Apnea, Glaucoma, Sleep Apnea, Comorbid History: Hypertension, Peripheral Arterial Hypertension, Peripheral Arterial Hypertension, Peripheral Arterial Disease, Peripheral Venous Disease, Disease, Peripheral Venous Disease, Disease, Peripheral Venous Disease, Type II Diabetes, Gout, Osteoarthritis, Type II Diabetes, Gout, Osteoarthritis, Type II Diabetes, Gout, Osteoarthritis, Neuropathy Neuropathy Neuropathy 08/23/2020 06/03/2021 05/02/2023 Date Acquired: 114 106 7 Weeks of Treatment: Open Open Open Wound Status: Yes Yes No Wound Recurrence: No Yes No Clustered Wound: N/A 0 N/A Clustered Quantity: 2x2.2x0.2 1.5x0.9x0.1 1.1x0.7x0.1 Measurements L x W x D (cm) 3.456 1.06 0.605 A (cm) : rea 0.691 0.106 0.06 Volume (cm) : 75.60% 35.70% -42.70% % Reduction in A rea: 75.60% 92.00% -42.90% % Reduction in Volume: Grade 2 Full Thickness With Exposed Support Grade 1 Classification: Structures Medium Medium Medium Exudate A mount: Serosanguineous Serous Serous Exudate Type: red, brown amber amber Exudate  Color: Epibole Distinct, outline attached Distinct, outline attached Wound Margin: Large (67-100%) Large (67-100%) Medium (34-66%) Granulation A mount: Red Red Red, Pink Granulation Quality: Small (1-33%) None Present (0%) Medium (34-66%) Necrotic A mount: Fat Layer (Subcutaneous Tissue): Yes Fat Layer (Subcutaneous Tissue): Yes Fat Layer (Subcutaneous Tissue): Yes Exposed Structures: Fascia: No Fascia: No Fascia: No Tendon: No Tendon: No Tendon: No Muscle: No Muscle: No Muscle: No Joint: No Joint: No Joint: No Bone: No Bone: No Bone: No None Small (1-33%) Medium (34-66%) Epithelialization: Debridement - Excisional Debridement - Excisional Debridement - Excisional Debridement: Pre-procedure Verification/Time Out 10:27 10:27 10:27 Taken: Lidocaine 5% topical ointment Lidocaine 5% topical ointment Lidocaine 5% topical ointment Pain Control: Subcutaneous, Slough Necrotic/Eschar, Subcutaneous, Subcutaneous, Slough Tissue Debrided: Slough Skin/Subcutaneous Tissue Skin/Subcutaneous Tissue Skin/Subcutaneous Tissue Level: 3.45 1.06 0.6 Debridement A (sq cm): rea Curette Curette Curette Instrument: Minimum Minimum Minimum Bleeding: Pressure Pressure Pressure Hemostasis Achieved: Procedure was tolerated well Procedure was tolerated well Procedure was tolerated well Debridement Treatment Response: 2x2.2x0.2 1.5x0.9x0.1 1.1x0.7x0.1 Post Debridement Measurements L x W x D (cm) 0.691 0.106 0.06 Post Debridement Volume: (cm) Callus: Yes Scarring: Yes No Abnormalities Noted Periwound Skin Texture: Dry/Scaly: Yes Dry/Scaly: Yes Dry/Scaly: Yes Periwound Skin Moisture: Maceration: No No Abnormalities Noted No Abnormalities Noted Hemosiderin Staining: Yes Periwound Skin Color: No Abnormality No Abnormality No Abnormality Temperature: Debridement Debridement Debridement Procedures Performed: Treatment Notes Electronic Signature(s) Signed: 06/20/2023 10:51:24 AM By:  [name] MD FACS Entered By: [name] on 06/20/2023 07:45:21 Multi-Disciplinary Care Plan Details -------------------------------------------------------------------------------- [name] ([id number]) [id number] Page 4 of 10 Patient Name: Date of Service: [name], [name] 06/20/2023 10:00 A M Medical Record Number: [id number] Patient Account Number: [id number] Date of Birth/Sex: Treating RN: 1950-09-03 (72 y.o. [name] Primary Care Finn Amos: [name] Other Clinician: Referring Larie Mathes: Treating Joretta [name]/Extender: [name] in Treatment: 114 Multidisciplinary Care Plan reviewed with physician Active Inactive Venous Leg Ulcer Nursing Diagnoses: Knowledge deficit related to disease process and management Potential for venous Insuffiency (use before diagnosis confirmed) Goals: Patient will maintain optimal edema control Date Initiated: 07/27/2021 Target Resolution Date: 07/25/2023 Goal Status: Active Interventions: Assess peripheral edema status every visit. Treatment Activities: Therapeutic compression applied : 07/27/2021 Notes: Wound/Skin Impairment Nursing Diagnoses: Impaired tissue integrity Knowledge deficit related to ulceration/compromised skin integrity Goals: Patient will have a decrease in wound volume by X% from date: (specify in notes) Date Initiated: 04/12/2021 Date Inactivated: 01/04/2022 Target Resolution Date: 04/23/2021 Goal Status: Met Patient/caregiver will verbalize understanding of skin care regimen Date Initiated: 01/04/2022 Target Resolution Date: 07/25/2023 Goal Status: Active Ulcer/skin breakdown will have a volume reduction  of 30% by week 4 Date Initiated: 04/12/2021 Date Inactivated: 04/27/2021 Target Resolution Date: 04/27/2021 Goal Status: Unmet Unmet Reason: infection Ulcer/skin breakdown will have a volume reduction of 50% by week 8 Date Initiated:  04/27/2021 Date Inactivated: 06/29/2021 Target Resolution Date: 06/24/2021 Goal Status: Met Interventions: Assess patient/caregiver ability to obtain necessary supplies Assess patient/caregiver ability to perform ulcer/skin care regimen upon admission and as needed Assess ulceration(s) every visit Notes: Electronic Signature(s) Signed: 06/20/2023 3:45:54 PM By: Redmond Pulling RN, BSN Entered By: Redmond Pulling on 06/20/2023 07:27:59 -------------------------------------------------------------------------------- Pain Assessment Details Patient Name: Date of Service: [name]. 06/20/2023 10:00 A M Medical Record Number: [id number] Patient Account Number: [id number] Date of Birth/Sex: Treating RN: 1950/12/31 (72 y.o. [name] Primary Care Katie Moch: [name] Other Clinician: Glynn [name] ([id number]) [id number] Page 5 of 10 Referring Chalice Philbert: Treating Jodette Wik/Extender: [name] in Treatment: 114 Active Problems Location of Pain Severity and Description of Pain Patient Has Paino Yes Site Locations Rate the pain. Current Pain Level: 3 Pain Management and Medication Current Pain Management: Electronic Signature(s) Signed: 06/20/2023 3:45:54 PM By: Redmond Pulling RN, BSN Entered By: Redmond Pulling on 06/20/2023 07:09:33 -------------------------------------------------------------------------------- Patient/Caregiver Education Details Patient Name: Date of Service: [name], [name] 12/26/2024andnbsp10:00 A M Medical Record Number: [id number] Patient Account Number: [id number] Date of Birth/Gender: Treating RN: 11-Jul-1950 (73 y.o. [name] Primary Care Physician: [name] Other Clinician: Referring Physician: Treating Physician/Extender: [name] in Treatment: 114 Education Assessment Education Provided To: Patient Education Topics Provided Wound/Skin  Impairment: Methods: Explain/Verbal Responses: State content correctly [employer]) Signed: 06/20/2023 3:45:54 PM By: Redmond Pulling RN, BSN Entered By: Redmond Pulling on 06/20/2023 53:66:44 [name] ([id number]) [id number] Page 6 of 10 -------------------------------------------------------------------------------- Wound Assessment Details Patient Name: Date of Service: [name], [name] 06/20/2023 10:00 A M Medical Record Number: [id number] Patient Account Number: [id number] Date of Birth/Sex: Treating RN: 1950-07-13 (72 y.o. [name] Primary Care Jadeyn Hargett: [name] Other Clinician: Referring Stacie Knutzen: Treating Roxana Lai/Extender: [name] in Treatment: 114 Wound Status Wound Number: 18R Primary Diabetic Wound/Ulcer of the Lower Extremity Etiology: Wound Location: Left, Plantar Metatarsal head first Wound Open Wounding Event: Gradually Appeared Status: Date Acquired: 08/23/2020 Comorbid Glaucoma, Sleep Apnea, Hypertension, Peripheral Arterial Disease, Weeks Of Treatment: 114 History: Peripheral Venous Disease, Type II Diabetes, Gout, Osteoarthritis, Clustered Wound: No Neuropathy Photos Wound Measurements Length: (cm) 2 Width: (cm) 2.2 Depth: (cm) 0.2 Area: (cm) 3.456 Volume: (cm) 0.691 % Reduction in Area: 75.6% % Reduction in Volume: 75.6% Epithelialization: None Tunneling: No Undermining: No Wound Description Classification: Grade 2 Wound Margin: Epibole Exudate Amount: Medium Exudate Type: Serosanguineous Exudate Color: red, brown Foul Odor After Cleansing: No Slough/Fibrino Yes Wound Bed Granulation Amount: Large (67-100%) Exposed Structure Granulation Quality: Red Fascia Exposed: No Necrotic Amount: Small (1-33%) Fat Layer (Subcutaneous Tissue) Exposed: Yes Necrotic Quality: Adherent Slough Tendon Exposed: No Muscle Exposed: No Joint Exposed: No Bone Exposed: No Periwound  Skin Texture Texture Color No Abnormalities Noted: No No Abnormalities Noted: Yes Callus: Yes Temperature / Pain Temperature: No Abnormality Moisture No Abnormalities Noted: Yes Treatment Notes Wound #18R (Metatarsal head first) Wound Laterality: Plantar, Left Cleanser Normal Saline [name] ([id number]) [id number] Page 7 of 10 Discharge Instruction: Cleanse the wound with Normal Saline prior to applying a clean dressing using gauze sponges, not tissue or cotton balls. Soap and Water Discharge Instruction: May shower and wash wound with dial antibacterial soap and water prior  to dressing change. Wound Cleanser Discharge Instruction: Cleanse the wound with wound cleanser prior to applying a clean dressing using gauze sponges, not tissue or cotton balls. Peri-Wound Care Topical Skintegrity Hydrogel 4 (oz) Discharge Instruction: Apply hydrogel as directed Primary Dressing Promogran Prisma Matrix, 4.34 (sq in) (silver collagen) Discharge Instruction: Moisten collagen with saline or hydrogel Secondary Dressing Optifoam Non-Adhesive Dressing, 4x4 in Discharge Instruction: Apply over primary dressing as directed. Woven Gauze Sponges 2x2 in Discharge Instruction: Apply over primary dressing as directed. Secured With 52M Medipore H Soft Cloth Surgical T ape, 4 x 10 (in/yd) Discharge Instruction: Secure with tape as directed. Compression Wrap Compression Stockings Add-Ons Electronic Signature(s) Signed: 06/20/2023 3:45:54 PM By: Redmond Pulling RN, BSN Entered By: Redmond Pulling on 06/20/2023 07:20:05 -------------------------------------------------------------------------------- Wound Assessment Details Patient Name: Date of Service: [name]. 06/20/2023 10:00 A M Medical Record Number: [id number] Patient Account Number: [id number] Date of Birth/Sex: Treating RN: [birth year] (72 y.o. [name] Primary Care Josephene Marrone: [name] Other  Clinician: Referring Hines Kloss: Treating Elchanan Bob/Extender: [name] in Treatment: 114 Wound Status Wound Number: 22R Primary Cyst Etiology: Wound Location: Left, Proximal, Lateral Lower Leg Wound Open Wounding Event: Bump Status: Date Acquired: 06/03/2021 Comorbid Glaucoma, Sleep Apnea, Hypertension, Peripheral Arterial Disease, Weeks Of Treatment: 106 History: Peripheral Venous Disease, Type II Diabetes, Gout, Osteoarthritis, Clustered Wound: Yes Neuropathy Photos [name], [name] ([id number]) [id number] Page 8 of 10 Wound Measurements Length: (cm) Width: (cm) Depth: (cm) Clustered Quantity: Area: (cm) Volume: (cm) 1.5 % Reduction in Area: 35.7% 0.9 % Reduction in Volume: 92% 0.1 Epithelialization: Small (1-33%) 0 Tunneling: No 1.06 Undermining: No 0.106 Wound Description Classification: Full Thickness With Exposed Support Structures Wound Margin: Distinct, outline attached Exudate Amount: Medium Exudate Type: Serous Exudate Color: amber Foul Odor After Cleansing: No Slough/Fibrino No Wound Bed Granulation Amount: Large (67-100%) Exposed Structure Granulation Quality: Red Fascia Exposed: No Necrotic Amount: None Present (0%) Fat Layer (Subcutaneous Tissue) Exposed: Yes Tendon Exposed: No Muscle Exposed: No Joint Exposed: No Bone Exposed: No Periwound Skin Texture Texture Color No Abnormalities Noted: No No Abnormalities Noted: Yes Scarring: Yes Temperature / Pain Temperature: No Abnormality Moisture No Abnormalities Noted: No Dry / Scaly: Yes Maceration: No Treatment Notes Wound #22R (Lower Leg) Wound Laterality: Left, Lateral, Proximal Cleanser Soap and Water Discharge Instruction: May shower and wash wound with dial antibacterial soap and water prior to dressing change. Wound Cleanser Discharge Instruction: Cleanse the wound with wound cleanser prior to applying a clean dressing using gauze  sponges, not tissue or cotton balls. Peri-Wound Care Topical Skintegrity Hydrogel 4 (oz) Discharge Instruction: Apply hydrogel as directed Primary Dressing Promogran Prisma Matrix, 4.34 (sq in) (silver collagen) Discharge Instruction: Moisten collagen with saline or hydrogel Secondary Dressing Woven Gauze Sponge, Non-Sterile 4x4 in Discharge Instruction: Apply over primary dressing as directed. Secured With 52M Medipore H Soft Cloth Surgical T ape, 4 x 10 (in/yd) Discharge Instruction: Secure with tape as directed. Compression Wrap Compression Stockings Add-Ons Electronic Signature(s) Signed: 06/20/2023 3:45:54 PM By: Redmond Pulling RN, BSN Entered By: Redmond Pulling on 06/20/2023 64:33:29 [name] ([id number]) [id number] Page 9 of 10 -------------------------------------------------------------------------------- Wound Assessment Details Patient Name: Date of Service: [name], [name] 06/20/2023 10:00 A M Medical Record Number: [id number] Patient Account Number: [id number] Date of Birth/Sex: Treating RN: 08-30-1950 (72 y.o. [name] Primary Care Nesreen Albano: [name] Other Clinician: Referring Takeyla Million: Treating Navy Belay/Extender: [name] in Treatment: 114 Wound Status Wound Number: 36 Primary Diabetic  Wound/Ulcer of the Lower Extremity Etiology: Wound Location: Left, Anterior Lower Leg Wound Open Wounding Event: Skin Tear/Laceration Status: Date Acquired: 05/02/2023 Comorbid Glaucoma, Sleep Apnea, Hypertension, Peripheral Arterial Disease, Weeks Of Treatment: 7 History: Peripheral Venous Disease, Type II Diabetes, Gout, Osteoarthritis, Clustered Wound: No Neuropathy Photos Wound Measurements Length: (cm) 1 Width: (cm) 0 Depth: (cm) 0 Area: (cm) Volume: (cm) .1 % Reduction in Area: -42.7% .7 % Reduction in Volume: -42.9% .1 Epithelialization: Medium (34-66%) 0.605 Tunneling: No 0.06 Undermining:  No Wound Description Classification: Grade 1 Wound Margin: Distinct, outline attached Exudate Amount: Medium Exudate Type: Serous Exudate Color: amber Foul Odor After Cleansing: No Slough/Fibrino Yes Wound Bed Granulation Amount: Medium (34-66%) Exposed Structure Granulation Quality: Red, Pink Fascia Exposed: No Necrotic Amount: Medium (34-66%) Fat Layer (Subcutaneous Tissue) Exposed: Yes Necrotic Quality: Adherent Slough Tendon Exposed: No Muscle Exposed: No Joint Exposed: No Bone Exposed: No Periwound Skin Texture Texture Color No Abnormalities Noted: Yes No Abnormalities Noted: No Hemosiderin Staining: Yes Moisture No Abnormalities Noted: Yes Temperature / Pain Temperature: No Abnormality Treatment Notes Wound #36 (Lower Leg) Wound Laterality: Left, Anterior Cleanser Soap and [address] [name], [name] ([id number]) [id number] Page 10 of 10 Discharge Instruction: May shower and wash wound with dial antibacterial soap and water prior to dressing change. Wound Cleanser Discharge Instruction: Cleanse the wound with wound cleanser prior to applying a clean dressing using gauze sponges, not tissue or cotton balls. Peri-Wound Care Topical Primary Dressing Maxorb Extra Ag+ Alginate Dressing, 2x2 (in/in) Discharge Instruction: Apply to wound bed as instructed Secondary Dressing Zetuvit Plus Silicone Border Dressing 4x4 (in/in) Discharge Instruction: Apply silicone border over primary dressing as directed. Secured With Compression Wrap Compression Stockings Facilities manager) Signed: 06/20/2023 3:45:54 PM By: Redmond Pulling RN, BSN Entered By: Redmond Pulling on 06/20/2023 07:22:19 -------------------------------------------------------------------------------- Vitals Details Patient Name: Date of Service: [name]. 06/20/2023 10:00 A M Medical Record Number: [id number] Patient Account Number: [id number] Date of Birth/Sex:  Treating RN: [birth year] (72 y.o. [name] Primary Care Darriana Deboy: [name] Other Clinician: Referring Shakiera Edelson: Treating Jacobi Ryant/Extender: [name] in Treatment: 114 Vital Signs Time Taken: 10:09 Temperature (F): 98.5 Height (in): 74 Pulse (bpm): 53 Weight (lbs): 238 Respiratory Rate (breaths/min): 18 Body Mass Index (BMI): 30.6 Blood Pressure (mmHg): 135/69 Capillary Blood Glucose (mg/dl): 440 Reference Range: 80 - 120 mg / dl Electronic Signature(s) Signed: 06/20/2023 3:45:54 PM By: Redmond Pulling RN, BSN Entered By: Redmond Pulling on 06/20/2023 07:13:05

## 2023-06-27 ENCOUNTER — Encounter (HOSPITAL_BASED_OUTPATIENT_CLINIC_OR_DEPARTMENT_OTHER): Payer: Medicare Other | Attending: General Surgery | Admitting: General Surgery

## 2023-06-27 DIAGNOSIS — E1151 Type 2 diabetes mellitus with diabetic peripheral angiopathy without gangrene: Secondary | ICD-10-CM | POA: Diagnosis not present

## 2023-06-27 DIAGNOSIS — L97128 Non-pressure chronic ulcer of left thigh with other specified severity: Secondary | ICD-10-CM | POA: Insufficient documentation

## 2023-06-27 DIAGNOSIS — G4730 Sleep apnea, unspecified: Secondary | ICD-10-CM | POA: Diagnosis not present

## 2023-06-27 DIAGNOSIS — L97828 Non-pressure chronic ulcer of other part of left lower leg with other specified severity: Secondary | ICD-10-CM | POA: Insufficient documentation

## 2023-06-27 DIAGNOSIS — I872 Venous insufficiency (chronic) (peripheral): Secondary | ICD-10-CM | POA: Diagnosis not present

## 2023-06-27 DIAGNOSIS — H409 Unspecified glaucoma: Secondary | ICD-10-CM | POA: Diagnosis not present

## 2023-06-27 DIAGNOSIS — M109 Gout, unspecified: Secondary | ICD-10-CM | POA: Insufficient documentation

## 2023-06-27 DIAGNOSIS — I129 Hypertensive chronic kidney disease with stage 1 through stage 4 chronic kidney disease, or unspecified chronic kidney disease: Secondary | ICD-10-CM | POA: Diagnosis not present

## 2023-06-27 DIAGNOSIS — E1122 Type 2 diabetes mellitus with diabetic chronic kidney disease: Secondary | ICD-10-CM | POA: Diagnosis not present

## 2023-06-27 DIAGNOSIS — Z792 Long term (current) use of antibiotics: Secondary | ICD-10-CM | POA: Diagnosis not present

## 2023-06-27 DIAGNOSIS — L97528 Non-pressure chronic ulcer of other part of left foot with other specified severity: Secondary | ICD-10-CM | POA: Insufficient documentation

## 2023-06-27 DIAGNOSIS — Z794 Long term (current) use of insulin: Secondary | ICD-10-CM | POA: Insufficient documentation

## 2023-06-27 DIAGNOSIS — I890 Lymphedema, not elsewhere classified: Secondary | ICD-10-CM | POA: Insufficient documentation

## 2023-06-27 DIAGNOSIS — E1140 Type 2 diabetes mellitus with diabetic neuropathy, unspecified: Secondary | ICD-10-CM | POA: Insufficient documentation

## 2023-06-27 DIAGNOSIS — N183 Chronic kidney disease, stage 3 unspecified: Secondary | ICD-10-CM | POA: Insufficient documentation

## 2023-06-27 NOTE — Progress Notes (Signed)
 [name] ([id number]) [id number] Page 1 of 10 Visit Report for 06/27/2023 Arrival Information Details Patient Name: Date of Service: [name], [name] 06/27/2023 7:45 A M Medical Record Number: [id number] Patient Account Number: [id number] Date of Birth/Sex: Treating RN: March 31, 1951 (73 y.o. [name] Primary Care [name]: [name] Other Clinician: Referring [name]: Treating [name]/Extender: [name] in Treatment: 115 Visit Information History Since Last Visit Added or deleted any medications: No Patient Arrived: Ambulatory Any new allergies or adverse reactions: No Arrival Time: 07:50 Had a fall or experienced change in No Accompanied By: self activities of daily living that may affect Transfer Assistance: None risk of falls: Patient Identification Verified: Yes Signs or symptoms of abuse/neglect since last visito No Patient Requires Transmission-Based Precautions: No Hospitalized since last visit: No Patient Has Alerts: Yes Implantable device outside of the clinic excluding No cellular tissue based products placed in the center since last visit: Has Dressing in Place as Prescribed: Yes Pain Present Now: No Electronic Signature(s) Signed: 06/27/2023 9:43:28 AM By: Claven Pollen RN Entered By: Claven [name] on 06/27/2023 07:50:45 -------------------------------------------------------------------------------- Encounter Discharge Information Details Patient Name: Date of Service: [name]. 06/27/2023 7:45 A M Medical Record Number: [id number] Patient Account Number: [id number] Date of Birth/Sex: Treating RN: 06-15-1951 (73 y.o. [name] Primary Care [name]: [name] Other Clinician: Referring Abree Romick: Treating Venice Marcucci/Extender: [name] in Treatment: 115 Encounter Discharge Information Items Post Procedure Vitals Discharge Condition:  Stable Temperature (F): 98.4 Ambulatory Status: Ambulatory Pulse (bpm): 64 Discharge Destination: Home Respiratory Rate (breaths/min): 16 Transportation: Private Auto Blood Pressure (mmHg): 152/85 Accompanied By: self Schedule Follow-up Appointment: Yes Clinical Summary of Care: Patient Declined Electronic Signature(s) Signed: 06/27/2023 9:43:28 AM By: Claven Pollen RN Entered By: Claven [name] on 06/27/2023 09:42:01 [name] ([id number]) [id number] Page 2 of 10 -------------------------------------------------------------------------------- Lower Extremity Assessment Details Patient Name: Date of Service: [name], [name] 06/27/2023 7:45 A M Medical Record Number: [id number] Patient Account Number: [id number] Date of Birth/Sex: Treating RN: Mar 21, 1951 (73 y.o. [name] Primary Care Honesti Seaberg: [name] Other Clinician: Referring Keonna Raether: Treating Sriya Kroeze/Extender: [name] in Treatment: 115 Edema Assessment Assessed: [name]: No] [name]: No] Edema: [Left: Ye] [Right: s] Calf Left: Right: Point of Measurement: 41 cm From Medial Instep 45.1 cm Ankle Left: Right: Point of Measurement: 10 cm From Medial Instep 27.7 cm Vascular Assessment Pulses: Dorsalis Pedis Palpable: [Left:Yes] Extremity colors, hair growth, and conditions: Extremity Color: [Left:Hyperpigmented] Hair Growth on Extremity: [Left:No] Temperature of Extremity: [Left:Warm] Capillary Refill: [Left:< 3 seconds] Dependent Rubor: [Left:No No] Electronic Signature(s) Signed: 06/27/2023 9:43:28 AM By: Claven Pollen RN Entered By: Claven [name] on 06/27/2023 07:58:39 -------------------------------------------------------------------------------- Multi Wound Chart Details Patient Name: Date of Service: [name]. 06/27/2023 7:45 A M Medical Record Number: [id number] Patient Account Number: [id number] Date of Birth/Sex: Treating  RN: April 12, 1951 (73 y.o. 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 Primary Etiology: Extremity Extremity Glaucoma, Sleep Apnea, Glaucoma, Sleep Apnea, Glaucoma, Sleep Apnea, Comorbid History: Hypertension, Peripheral Arterial Hypertension, Peripheral Arterial  Hypertension, Peripheral Arterial Disease, Peripheral Venous Disease, Disease, Peripheral Venous Disease, Disease, Peripheral Venous Disease, Type II Diabetes, Gout, Osteoarthritis, Type II Diabetes, Gout, Osteoarthritis, Type II Diabetes, Gout, Osteoarthritis, Neuropathy Neuropathy Neuropathy 08/23/2020 06/03/2021 05/02/2023 Date Acquired: 115 107 8 Weeks of Treatment: Open Open Open Wound Status: Yes Yes No Wound Recurrence: No Yes No Clustered Wound: N/A 0 N/A Clustered Quantity: 2x2.5x0.2 1.3x0.7x0.1 1.1x0.6x0.2 Measurements L x W x D (cm) 3.927 0.715 0.518 A (cm) : rea 0.785 0.071 0.104 Volume (cm) : 72.20% 56.60% -22.20% % Reduction in A rea: 72.20% 94.60% -147.60% % Reduction in Volume: 6 Starting Position 1 (o'clock): 9 Ending Position 1 (o'clock): 0.3 Maximum Distance 1 (cm): Yes No No Undermining: Grade 2 Full Thickness With Exposed Support Grade 1 Classification: Structures Medium Medium Medium Exudate A mount: Serosanguineous Serous Serous Exudate Type: red, brown amber amber Exudate  Color: Epibole Distinct, outline attached Distinct, outline attached Wound Margin: Large (67-100%) Large (67-100%) Medium (34-66%) Granulation A mount: Red Red Red, Pink Granulation Quality: Small (1-33%) Small (1-33%) Medium (34-66%) Necrotic A mount: Adherent Slough Eschar, Adherent Slough Adherent Slough Necrotic Tissue: Fat Layer (Subcutaneous Tissue): Yes Fat Layer (Subcutaneous Tissue): Yes Fat Layer (Subcutaneous Tissue): Yes Exposed Structures: Fascia: No Fascia: No Fascia: No Tendon: No Tendon: No Tendon: No Muscle: No Muscle: No Muscle: No Joint: No Joint: No Joint: No Bone: No Bone: No Bone: No None Small (1-33%) Medium (34-66%) Epithelialization: Debridement - Excisional Debridement - Excisional Debridement - Excisional Debridement: Pre-procedure Verification/Time Out 08:15 08:15 08:15 Taken: Lidocaine  4% Topical Solution Lidocaine  4% Topical Solution Lidocaine  4% Topical Solution Pain Control: Callus, Subcutaneous, Slough Subcutaneous, Slough Subcutaneous, Slough Tissue Debrided: Skin/Subcutaneous Tissue Skin/Subcutaneous Tissue Skin/Subcutaneous Tissue Level: 5.89 0.71 0.52 Debridement A (sq cm): rea Curette Curette Curette Instrument: Minimum Minimum Minimum Bleeding: Pressure Pressure Pressure Hemostasis A chieved: Procedure was tolerated well Procedure was tolerated well Procedure was tolerated well Debridement Treatment Response: 2x2.5x0.2 1.3x0.7x0.1 1.1x0.6x0.2 Post Debridement Measurements L x W x D (cm) 0.785 0.071 0.104 Post Debridement Volume: (cm) Callus: Yes Scarring: Yes No Abnormalities Noted Periwound Skin Texture: Dry/Scaly: Yes Dry/Scaly: Yes Dry/Scaly: Yes Periwound Skin Moisture: Maceration: No No Abnormalities Noted No Abnormalities Noted Hemosiderin Staining: Yes Periwound Skin Color: No Abnormality No Abnormality No Abnormality Temperature: Debridement Debridement Debridement Procedures Performed: Treatment  Notes Electronic Signature(s) Signed: 06/27/2023 8:28:55 AM By: [name] Nest MD FACS Entered By: [name] Nest on 06/27/2023 91:71:44 [name] ([id number]) [id number] Page 4 of 10 -------------------------------------------------------------------------------- Multi-Disciplinary Care Plan Details Patient Name: Date of Service: [name], [name] 06/27/2023 7:45 A M Medical Record Number: [id number] Patient Account Number: [id number] Date of Birth/Sex: Treating RN: [birth year] (73 y.o. [name] Primary Care Beryl Hornberger: [name] Other Clinician: Referring Vanshika Jastrzebski: Treating Jayci Ellefson/Extender: [name] Nest [name] [name] in Treatment: 115 Multidisciplinary Care Plan reviewed with physician Active Inactive Venous Leg Ulcer Nursing Diagnoses: Knowledge deficit related to disease process and management Potential for venous Insuffiency (use before diagnosis confirmed) Goals: Patient will maintain optimal edema control Date Initiated: 07/27/2021 Target Resolution Date: 07/25/2023 Goal Status: Active Interventions: Assess peripheral edema status every visit. Treatment Activities: Therapeutic compression applied : 07/27/2021 Notes: Wound/Skin Impairment Nursing Diagnoses: Impaired tissue integrity Knowledge deficit related to ulceration/compromised skin integrity Goals: Patient will have a decrease in wound volume by X% from date: (specify in notes) Date Initiated: 04/12/2021 Date Inactivated: 01/04/2022 Target Resolution Date: 04/23/2021 Goal Status: Met Patient/caregiver will verbalize understanding of skin care regimen Date Initiated: 01/04/2022 Target Resolution Date: 07/25/2023 Goal Status:  Active Ulcer/skin breakdown will have a volume reduction of 30% by week 4 Date Initiated: 04/12/2021 Date Inactivated: 04/27/2021 Target Resolution Date: 04/27/2021 Goal Status: Unmet Unmet Reason: infection Ulcer/skin breakdown will have a  volume reduction of 50% by week 8 Date Initiated: 04/27/2021 Date Inactivated: 06/29/2021 Target Resolution Date: 06/24/2021 Goal Status: Met Interventions: Assess patient/caregiver ability to obtain necessary supplies Assess patient/caregiver ability to perform ulcer/skin care regimen upon admission and as needed Assess ulceration(s) every visit Notes: Electronic Signature(s) Signed: 06/27/2023 9:43:28 AM By: Claven Pollen RN Entered By: Claven [name] on 06/27/2023 09:40:33 [name] ([id number]) [id number] Page 5 of 10 -------------------------------------------------------------------------------- Pain Assessment Details Patient Name: Date of Service: [name], [name] 06/27/2023 7:45 A M Medical Record Number: [id number] Patient Account Number: [id number] Date of Birth/Sex: Treating RN: [birth year] (73 y.o. [name] Primary Care Kenika Sahm: [name] Other Clinician: Referring Marlys Stegmaier: Treating Fareedah Mahler/Extender: [name] in Treatment: 115 Active Problems Location of Pain Severity and Description of Pain Patient Has Paino No Site Locations Pain Management and Medication Current Pain Management: Electronic Signature(s) Signed: 06/27/2023 9:43:28 AM By: Claven Pollen RN Entered By: Claven [name] on 06/27/2023 07:58:21 -------------------------------------------------------------------------------- Patient/Caregiver Education Details Patient Name: Date of Service: [name] 1/2/2025andnbsp7:45 A M Medical Record Number: [id number] Patient Account Number: [id number] Date of Birth/Gender: Treating RN: 08-07-1950 (72 y.o. [name] Primary Care Physician: [name] Other Clinician: Referring Physician: Treating Physician/Extender: [name] in Treatment: 115 Education Assessment Education Provided To: Patient Education Topics Provided Wound/Skin  Impairment: Methods: Explain/Verbal Responses: State content correctly ERIBERTO, FELCH ([id number]) [id number] Page 6 of 10 Electronic Signature(s) Signed: 06/27/2023 9:43:28 AM By: Claven Pollen RN Entered By: Claven [name] on 06/27/2023 09:40:53 -------------------------------------------------------------------------------- Wound Assessment Details Patient Name: Date of Service: [name], [name] 06/27/2023 7:45 A M Medical Record Number: [id number] Patient Account Number: [id number] Date of Birth/Sex: Treating RN: 30-Jun-1950 (73 y.o. [name] Primary Care Hailynn Slovacek: [name] Other Clinician: Referring Clois Treanor: Treating Besan Ketchem/Extender: [name] in Treatment: 115 Wound Status Wound Number: [id number] Primary Diabetic Wound/Ulcer of the Lower Extremity Etiology: Wound Location: Left, Plantar Metatarsal head first Wound Open Wounding Event: Gradually Appeared Status: Date Acquired: 08/23/2020 Comorbid Glaucoma, Sleep Apnea, Hypertension, Peripheral Arterial Disease, Weeks Of Treatment: 115 History: Peripheral Venous Disease, Type II Diabetes, Gout, Osteoarthritis, Clustered Wound: No Neuropathy Photos Wound Measurements Length: (cm) 2 Width: (cm) 2.5 Depth: (cm) 0.2 Area: (cm) 3.927 Volume: (cm) 0.785 % Reduction in Area: 72.2% % Reduction in Volume: 72.2% Epithelialization: None Undermining: Yes Starting Position (o'clock): 6 Ending Position (o'clock): 9 Maximum Distance: (cm) 0.3 Wound Description Classification: Grade 2 Wound Margin: Epibole Exudate Amount: Medium Exudate Type: Serosanguineous Exudate Color: red, brown Foul Odor After Cleansing: No Slough/Fibrino Yes Wound Bed Granulation Amount: Large (67-100%) Exposed Structure Granulation Quality: Red Fascia Exposed: No Necrotic Amount: Small (1-33%) Fat Layer (Subcutaneous Tissue) Exposed: Yes Necrotic Quality: Adherent Slough Tendon Exposed:  No Muscle Exposed: No Joint Exposed: No Bone Exposed: No Periwound Skin Texture Texture Color No Abnormalities Noted: No No Abnormalities NotedELIAN, [name] ([id number]) [id number] Page 7 of 10 Callus: Yes Temperature / Pain Temperature: No Abnormality Moisture No Abnormalities Noted: Yes Treatment Notes Wound #18R (Metatarsal head first) Wound Laterality: Plantar, Left Cleanser Normal Saline Discharge Instruction: Cleanse the wound with Normal Saline prior to applying a clean dressing using gauze sponges, not tissue or cotton balls. Soap and Water Discharge Instruction: May shower and wash  wound with dial  antibacterial soap and water prior to dressing change. Wound Cleanser Discharge Instruction: Cleanse the wound with wound cleanser prior to applying a clean dressing using gauze sponges, not tissue or cotton balls. Peri-Wound Care Topical Skintegrity Hydrogel 4 (oz) Discharge Instruction: Apply hydrogel as directed Primary Dressing Promogran Prisma Matrix, 4.34 (sq in) (silver collagen) Discharge Instruction: Moisten collagen with saline or hydrogel Secondary Dressing Optifoam Non-Adhesive Dressing, 4x4 in Discharge Instruction: Apply over primary dressing as directed. Woven Gauze Sponges 2x2 in Discharge Instruction: Apply over primary dressing as directed. Secured With 41M Medipore H Soft Cloth Surgical T ape, 4 x 10 (in/yd) Discharge Instruction: Secure with tape as directed. Compression Wrap Compression Stockings Add-Ons Electronic Signature(s) Signed: 06/27/2023 9:43:28 AM By: Claven Pollen RN Entered By: Claven [name] on 06/27/2023 08:09:51 -------------------------------------------------------------------------------- Wound Assessment Details Patient Name: Date of Service: [name] CLINT ILLONA LELON. 06/27/2023 7:45 A M Medical Record Number: [id number] Patient Account Number: [id number] Date of Birth/Sex: Treating RN: 09-23-1950 (73 y.o.  [name] Primary Care Katelee Schupp: [name] Other Clinician: Referring Halton Neas: Treating Kihanna Kamiya/Extender: [name] in Treatment: 115 Wound Status Wound Number: [id number] Primary Cyst Etiology: Wound Location: Left, Proximal, Lateral Lower Leg Wound Open Wounding Event: Bump Status: Date Acquired: 06/03/2021 Comorbid Glaucoma, Sleep Apnea, Hypertension, Peripheral Arterial Disease, Weeks Of Treatment: 107 History: Peripheral Venous Disease, Type II Diabetes, Gout, Osteoarthritis, Clustered Wound: Yes Neuropathy Photos YSIDRO, RAMSAY ([id number]) [id number] Page 8 of 10 Wound Measurements Length: (cm) Width: (cm) Depth: (cm) Clustered Quantity: Area: (cm) Volume: (cm) 1.3 % Reduction in Area: 56.6% 0.7 % Reduction in Volume: 94.6% 0.1 Epithelialization: Small (1-33%) 0 Tunneling: No 0.715 Undermining: No 0.071 Wound Description Classification: Full Thickness With Exposed Support Structures Wound Margin: Distinct, outline attached Exudate Amount: Medium Exudate Type: Serous Exudate Color: amber Foul Odor After Cleansing: No Slough/Fibrino No Wound Bed Granulation Amount: Large (67-100%) Exposed Structure Granulation Quality: Red Fascia Exposed: No Necrotic Amount: Small (1-33%) Fat Layer (Subcutaneous Tissue) Exposed: Yes Necrotic Quality: Eschar, Adherent Slough Tendon Exposed: No Muscle Exposed: No Joint Exposed: No Bone Exposed: No Periwound Skin Texture Texture Color No Abnormalities Noted: No No Abnormalities Noted: Yes Scarring: Yes Temperature / Pain Temperature: No Abnormality Moisture No Abnormalities Noted: No Dry / Scaly: Yes Maceration: No Treatment Notes Wound #22R (Lower Leg) Wound Laterality: Left, Lateral, Proximal Cleanser Soap and Water Discharge Instruction: May shower and wash wound with dial  antibacterial soap and water prior to dressing change. Wound Cleanser Discharge  Instruction: Cleanse the wound with wound cleanser prior to applying a clean dressing using gauze sponges, not tissue or cotton balls. Peri-Wound Care Topical Skintegrity Hydrogel 4 (oz) Discharge Instruction: Apply hydrogel as directed Primary Dressing Promogran Prisma Matrix, 4.34 (sq in) (silver collagen) Discharge Instruction: Moisten collagen with saline or hydrogel Secondary Dressing Woven Gauze Sponge, Non-Sterile 4x4 in Discharge Instruction: Apply over primary dressing as directed. Secured With 41M Medipore H Soft Cloth Surgical T ape, 4 x 10 (in/yd) Discharge Instruction: Secure with tape as directed. [name], [name] ([id number]) [id number] Page 9 of 10 Compression Wrap Compression Stockings Add-Ons Electronic Signature(s) Signed: 06/27/2023 9:43:28 AM By: Claven Pollen RN Entered By: Claven [name] on 06/27/2023 08:10:29 -------------------------------------------------------------------------------- Wound Assessment Details Patient Name: Date of Service: [name] CLINT ILLONA LELON. 06/27/2023 7:45 A M Medical Record Number: [id number] Patient Account Number: [id number] Date of Birth/Sex: Treating RN: 1950/12/04 (73 y.o. [name] Primary Care Ainsley Deakins: [name] Other Clinician: Referring Raeleigh Guinn: Treating Costella Schwarz/Extender: [name]  in Treatment: 115 Wound Status Wound Number: 36 Primary Diabetic Wound/Ulcer of the Lower Extremity Etiology: Wound Location: Left, Anterior Lower Leg Wound Open Wounding Event: Skin Tear/Laceration Status: Date Acquired: 05/02/2023 Comorbid Glaucoma, Sleep Apnea, Hypertension, Peripheral Arterial Disease, Weeks Of Treatment: 8 History: Peripheral Venous Disease, Type II Diabetes, Gout, Osteoarthritis, Clustered Wound: No Neuropathy Photos Wound Measurements Length: (cm) 1.1 Width: (cm) 0.6 Depth: (cm) 0.2 Area: (cm) 0.518 Volume: (cm) 0.104 % Reduction in Area: -22.2% %  Reduction in Volume: -147.6% Epithelialization: Medium (34-66%) Tunneling: No Undermining: No Wound Description Classification: Grade 1 Wound Margin: Distinct, outline attached Exudate Amount: Medium Exudate Type: Serous Exudate Color: amber Foul Odor After Cleansing: No Slough/Fibrino Yes Wound Bed Granulation Amount: Medium (34-66%) Exposed Structure Granulation Quality: Red, Pink Fascia Exposed: No Necrotic Amount: Medium (34-66%) Fat Layer (Subcutaneous Tissue) Exposed: Yes Necrotic Quality: Adherent Slough Tendon Exposed: No Muscle Exposed: No Joint Exposed: No Bone Exposed: No [address] STANDLEY, BARGO ([id number]) [id number] Page 10 of 10 Texture Color No Abnormalities Noted: Yes No Abnormalities Noted: No Hemosiderin Staining: Yes Moisture No Abnormalities Noted: Yes Temperature / Pain Temperature: No Abnormality Treatment Notes Wound #36 (Lower Leg) Wound Laterality: Left, Anterior Cleanser Soap and Water Discharge Instruction: May shower and wash wound with dial  antibacterial soap and water prior to dressing change. Wound Cleanser Discharge Instruction: Cleanse the wound with wound cleanser prior to applying a clean dressing using gauze sponges, not tissue or cotton balls. Peri-Wound Care Topical Primary Dressing Maxorb Extra Ag+ Alginate Dressing, 2x2 (in/in) Discharge Instruction: Apply to wound bed as instructed Secondary Dressing Zetuvit Plus Silicone Border Dressing 4x4 (in/in) Discharge Instruction: Apply silicone border over primary dressing as directed. Secured With Compression Wrap Compression Stockings Facilities Manager) Signed: 06/27/2023 9:43:28 AM By: Claven Pollen RN Entered By: Claven [name] on 06/27/2023 08:11:06 -------------------------------------------------------------------------------- Vitals Details Patient Name: Date of Service: [name]. 06/27/2023 7:45 A M Medical  Record Number: [id number] Patient Account Number: [id number] Date of Birth/Sex: Treating RN: 02/10/1951 (73 y.o. [name] Primary Care Zhuri Krass: [name] Other Clinician: Referring Bernita Beckstrom: Treating Jaimes Eckert/Extender: [name] in Treatment: 115 Vital Signs Time Taken: 07:58 Temperature (F): 98.4 Height (in): 74 Pulse (bpm): 64 Weight (lbs): 238 Respiratory Rate (breaths/min): 16 Body Mass Index (BMI): 30.6 Blood Pressure (mmHg): 152/85 Reference Range: 80 - 120 mg / dl Electronic Signature(s) Signed: 06/27/2023 9:43:28 AM By: Claven Pollen RN Entered By: Claven [name] on 06/27/2023 07:58:16

## 2023-06-27 NOTE — Progress Notes (Addendum)
 MUMIN, DENOMME ([id number]) [id number] Page 1 of 22 Visit Report for 06/27/2023 Chief Complaint Document Details Patient Name: Date of Service: [name], RAATZ 06/27/2023 7:45 A M Medical Record Number: [id number] Patient Account Number: [id number] Date of Birth/Sex: Treating RN: [birth year] (73 y.o. M) Primary Care Provider: Valma Carwin Other Clinician: Referring Provider: Treating Provider/Extender: Marolyn Delon Valma Carwin Devra in Treatment: 115 Information Obtained from: Patient Chief Complaint Left leg and foot ulcers 04/12/2021; patient is here for wounds on his left lower leg and left plantar foot over the first metatarsal head Electronic Signature(s) Signed: 06/27/2023 8:29:17 AM By: Marolyn Delon MD FACS Entered By: Marolyn Delon on 06/27/2023 05:29:17 -------------------------------------------------------------------------------- Debridement Details Patient Name: Date of Service: EZZARD [name] ORN. 06/27/2023 7:45 A M Medical Record Number: [id number] Patient Account Number: [id number] Date of Birth/Sex: Treating RN: [birth year] (72 y.o. [name] Primary Care Provider: Valma Carwin Other Clinician: Referring Provider: Treating Provider/Extender: Marolyn Delon Valma Carwin Devra in Treatment: 115 Debridement Performed for Assessment: Wound #22R Left,Proximal,Lateral Lower Leg Performed By: Physician Marolyn Delon, MD The following information was scribed by: Claven Pollen The information was scribed for: Marolyn Delon Debridement Type: Debridement Level of Consciousness (Pre-procedure): Awake and Alert Pre-procedure Verification/Time Out Yes - 08:15 Taken: Start Time: 08:15 Pain Control: Lidocaine  4% T opical Solution Percent of Wound Bed Debrided: 100% T Area Debrided (cm): otal 0.71 Tissue and other material debrided: Viable, Non-Viable, Slough, Subcutaneous, Skin: Dermis , Skin: Epidermis, Slough Level:  Skin/Subcutaneous Tissue Debridement Description: Excisional Instrument: Curette Bleeding: Minimum Hemostasis Achieved: Pressure Response to Treatment: Procedure was tolerated well Level of Consciousness (Post- Awake and Alert procedure): Post Debridement Measurements of Total Wound Length: (cm) 1.3 Width: (cm) 0.7 Depth: (cm) 0.1 Volume: (cm) 0.071 Character of Wound/Ulcer Post Debridement: Improved EZZARD UBALDO ORN ([id number]) [id number] Page 2 of 22 Post Procedure Diagnosis Same as Pre-procedure Electronic Signature(s) Signed: 06/27/2023 8:44:52 AM By: Marolyn Delon MD FACS Signed: 06/27/2023 9:43:28 AM By: Claven Pollen RN Entered By: Claven Pollen on 06/27/2023 05:18:50 -------------------------------------------------------------------------------- Debridement Details Patient Name: Date of Service: EZZARD [name] ORN. 06/27/2023 7:45 A M Medical Record Number: [id number] Patient Account Number: [id number] Date of Birth/Sex: Treating RN: Jun 17, 1951 (72 y.o. [name] Primary Care Provider: Valma Carwin Other Clinician: Referring Provider: Treating Provider/Extender: Marolyn Delon Valma Carwin Devra in Treatment: 115 Debridement Performed for Assessment: Wound #36 Left,Anterior Lower Leg Performed By: Physician Marolyn Delon, MD The following information was scribed by: Claven Pollen The information was scribed for: Marolyn Delon Debridement Type: Debridement Severity of Tissue Pre Debridement: Fat layer exposed Level of Consciousness (Pre-procedure): Awake and Alert Pre-procedure Verification/Time Out Yes - 08:15 Taken: Start Time: 08:15 Pain Control: Lidocaine  4% T opical Solution Percent of Wound Bed Debrided: 100% T Area Debrided (cm): otal 0.52 Tissue and other material debrided: Viable, Non-Viable, Slough, Subcutaneous, Slough Level: Skin/Subcutaneous Tissue Debridement Description: Excisional Instrument:  Curette Bleeding: Minimum Hemostasis Achieved: Pressure Response to Treatment: Procedure was tolerated well Level of Consciousness (Post- Awake and Alert procedure): Post Debridement Measurements of Total Wound Length: (cm) 1.1 Width: (cm) 0.6 Depth: (cm) 0.2 Volume: (cm) 0.104 Character of Wound/Ulcer Post Debridement: Improved Severity of Tissue Post Debridement: Fat layer exposed Post Procedure Diagnosis Same as Pre-procedure Electronic Signature(s) Signed: 06/27/2023 8:44:52 AM By: Marolyn Delon MD FACS Signed: 06/27/2023 9:43:28 AM By: Claven Pollen RN Entered By: Claven Pollen on 06/27/2023 05:19:59 Debridement Details -------------------------------------------------------------------------------- EZZARD UBALDO ORN ([id number]) [id number] Page 3 of 22 Patient Name: Date of Service: Wire, [place]  [name] W. 06/27/2023 7:45 A M Medical Record Number: [id number] Patient Account Number: [id number] Date of Birth/Sex: Treating RN: [birth year] (73 y.o. [name] Primary Care Provider: Valma Carwin Other Clinician: Referring Provider: Treating Provider/Extender: Marolyn Delon Valma Carwin Devra in Treatment: 115 Debridement Performed for Assessment: Wound #18R Left,Plantar Metatarsal head first Performed By: Physician Marolyn Delon, MD The following information was scribed by: Claven Pollen The information was scribed for: Marolyn Delon Debridement Type: Debridement Severity of Tissue Pre Debridement: Fat layer exposed Level of Consciousness (Pre-procedure): Awake and Alert Pre-procedure Verification/Time Out Yes - 08:15 Taken: Start Time: 08:15 Pain Control: Lidocaine  4% T opical Solution Percent of Wound Bed Debrided: 150% T Area Debrided (cm): otal 5.89 Tissue and other material debrided: Viable, Non-Viable, Callus, Slough, Subcutaneous, Slough Level: Skin/Subcutaneous Tissue Debridement Description: Excisional Instrument:  Curette Bleeding: Minimum Hemostasis Achieved: Pressure Response to Treatment: Procedure was tolerated well Level of Consciousness (Post- Awake and Alert procedure): Post Debridement Measurements of Total Wound Length: (cm) 2 Width: (cm) 2.5 Depth: (cm) 0.2 Volume: (cm) 0.785 Character of Wound/Ulcer Post Debridement: Improved Severity of Tissue Post Debridement: Fat layer exposed Post Procedure Diagnosis Same as Pre-procedure Electronic Signature(s) Signed: 06/27/2023 8:44:52 AM By: Marolyn Delon MD FACS Signed: 06/27/2023 9:43:28 AM By: Claven Pollen RN Entered By: Claven Pollen on 06/27/2023 05:22:18 -------------------------------------------------------------------------------- HPI Details Patient Name: Date of Service: EZZARD [name] LELON. 06/27/2023 7:45 A M Medical Record Number: [id number] Patient Account Number: [id number] Date of Birth/Sex: Treating RN: 06/13/1951 (73 y.o. M) Primary Care Provider: Valma Carwin Other Clinician: Referring Provider: Treating Provider/Extender: Marolyn Delon Valma Carwin Devra in Treatment: 115 History of Present Illness HPI Description: 10/11/17; Mr. Smyers is a 73 year old man who tells me that in 2015 he slipped down the latter traumatizing his left leg. He developed a wound in the same spot the area that we are currently looking at. He states this closed over for the most part although he always felt it was somewhat unstable. In 2016 he hit the same area with the door of his car had this reopened. He tells me that this is never really closed although sometimes an inflow it remains open on a constant basis. He has not been using any specific dressing to this except for topical antibiotics the nature of which were not really sure. His primary doctor did send him to see Dr. Ladona of interventional cardiology. He underwent an angiogram on 08/06/17 and he underwent a PTA and directional atherectomy of the lesser distal SFA and popliteal  arteries which resulted in brisk improvement in blood flow. It was noted that he had 2 vessel runoff through the anterior tibial and peroneal. He is also been to see vascular and interventional radiologist. He was not felt to have any significant superficial venous insufficiency. Presumably is not a candidate for any ablation. It was suggested he come here for wound care. The patient is a type II diabetic on insulin . He also has a history of venous insufficiency. DANIELL, MANCINAS ([id number]) [id number] Page 4 of 22 ABIs on the left were noncompressible in our clinic 10/21/17; patient we admitted to the clinic last week. He has a fairly large chronic ulcer on the left lateral calf in the setting of chronic venous insufficiency. We put Iodosorb on him after an aggressive debridement and 3 layer compression. He complained of pain in his ankle and itching with is skin in fact he scratched the area on the medial calf superiorly at the rim of our wraps and  he has 2 small open areas in that location today which are new. I changed his primary dressing today to silver collagen. As noted he is already had revascularization and does not have any significant superficial venous insufficiency that would be amenable to ablation 10/28/17; patient admitted to the clinic 2 weeks ago. He has a smaller Wound. Scratch injury from last week revealed. There is large wound over the tibial area. This is smaller. Granulation looks healthy. No need for debridement. 11/04/17; the wound on the left lateral calf looks better. Improved dimensions. Surface of this looks better. We've been maintaining him and Kerlix Coban wraps. He finds this much more comfortable. Silver collagen dressing 11/11/17; left lateral Wound continues to look healthy be making progress. Using a #5 curet I removed removed nonviable skin from the surface of the wound and then necrotic debris from the wound surface. Surface of the wound  continues to look healthy. He also has an open area on the left great toenail bed. We've been using topical antibiotics. 11/19/17; left anterior lateral wound continues to look healthy but it's not closed. He also had a small wound above this on the left leg Initially traumatic wounds in the setting of significant chronic venous insufficiency and stasis dermatitis 11/25/17; left anterior wounds superiorly is closed still a small wound inferiorly. 12/02/17; left anterior tibial area. Arrives today with adherent callus. Post debridement clearly not completely closed. Hydrofera Blue under 3 layer compression. 12/09/17; left anterior tibia. Circumferential eschar however the wound bed looks stable to improved. We've been using Hydrofera Blue under 3 layer compression 12/17/17; left anterior tibia. Apparently this was felt to be closed however when the wrap was taken off there is a skin tear to reopen wounds in the same area we've been using Hydrofera Blue under 3 layer compression 12/23/17 left anterior tibia. Not close to close this week apparently the Hydrofera Blue was stuck to this again. Still circumferential eschar requiring debridement. I put a contact layer on this this time under the Hydrofera Blue 12/31/17; left anterior tibia. Wound is better slight amount of hyper-granulation. Using Hydrofera Blue over Adaptic. 01/07/18; left anterior tibia. The wound had some surface eschar however after this was removed he has no open wound.he was already revascularized by Dr. Ladona when he came to our clinic with atherectomy of the left SFA and popliteal artery. He was also sent to interventional radiology for venous reflux studies. He was not felt to have significant reflux but certainly has chronic venous changes of his skin with hemosiderin deposition around this area. He will definitely need to lubricate his skin and wear compression stocking and I've talked to him about this. READMISSION 05/26/2018 This is a  now 73 year old man we cared for with traumatic wounds on his left anterior lower extremity. He had been previously revascularized during that admission by Dr. Ladona. Apparently in follow-up Dr. Ladona noted that he had deterioration in his arterial status. He underwent a stent placement in the distal left SFA on 04/22/2018. 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 He went back to the angiography suite on 04/30/2018 he underwent PTA and balloon angioplasty of the occluded left mid anterior tibial artery, thrombotic occlusion went from 100 to 0% which reconstitutes the posterior tibial artery. He had thrombectomy and aspiration of the peroneal artery. The stent placed in the distal SFA left SFA was still occluded. He was discharged on Xarelto , it was noted on the discharge summary from this hospitalization that he had gangrene at  the tip of his left fifth toe and there were expectations this would auto amputate. 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 He has been recuperating at [facility] home in [facility] after the most recent hospitalization. He is going home tomorrow. He tells me that 2 weeks ago he traumatized the tip of his left fifth toe. He came in urgently for our review of this. This was a history of before I noted that Dr. Ladona had already noted dry gangrenous changes of the left fifth toe 06/09/2018; 2-week follow-up. I did contact Dr. Ladona after his last appointment and he apparently saw 1 of Dr. Godfrey colleagues the next day. He does not follow-up with Dr. Ladona himself until Thursday of this week. 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 Nevertheless there is no evidence of infection no drainage and no pain. He had a new area that this week when we were signing him in today on the left anterior mid tibia area, this is in close proximity to the previous wound we have dealt with in this  clinic. 06/23/2018; 2-week follow-up. I did not receive a recent note from Dr. Ladona to review today. Our office is trying to obtain this. He is apparently not planning to do further vascular interventions and wondered about compression to try and help with the patient's chronic venous insufficiency. However we are also concerned about the arterial flow. He arrives in clinic today with a new area on the left third toe. The areas on the calf/anterior tibia are close to closing. The left fifth toe is still mummified using Betadine. -In reviewing things with the patient he has what sounds like claudication with mild to moderate amount of activity. 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 I prescribed Levaquin over the phone while we attempted to arrange a plan of care. However the patient called yesterday to report he had low-grade fever and he came in today acutely. There is been a marked deterioration in the left third toe with spreading cellulitis up into the dorsal left foot. He was referred to the emergency room. Readmission: 06/29/2020 patient presents today for reevaluation here in our clinic he was previously treated by Dr. Rufus at the latter part of 2019 in 2 the beginning of 2020. Subsequently we have not seen him since that time in the interim he did have evaluation with vein and vascular specialist specifically Dr. Penne Angus who did perform quite extensive work for a left femoral to anterior tibial artery bypass. With that being said in the interim the patient has developed significant lymphedema and has wounds that he tells me have really never healed in regard to the incision site on the left leg. He also has multiple wounds on the feet for various reasons some of which is that he tends to pick at his feet. Fortunately there is no signs of active infection systemically at this time he does have some wounds that are little bit deeper but most are fairly  superficial he seems to have good blood flow and overall everything appears to be healthy I see no bone exposed and no obvious signs of osteomyelitis. I do not know that he necessarily needs a x-ray at this point although that something we could consider depending on how things progress. The patient does have a history of lymphedema, diabetes, this is type II, chronic kidney  disease stage III, hypertension, and history of peripheral vascular disease. 07/05/2020; patient admitted last week. Is a patient I remember from 2019 he had a spreading infection involving the left foot and we sent him to the hospital. 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 He subsequently had a left femoral to anterior tibial bypass by Dr.Cain vein and vascular. He also has severe lymphedema with chronic skin changes related to that on the left leg. The most problematic area that was new today was on the left medial great toe. This was apparently a small area last week there was purulent drainage which our intake nurse cultured. Also areas on the left medial foot and heel left lateral foot. He has 2 areas on the left medial calf left lateral calf in the setting of the severe lymphedema. 07/13/2020 on evaluation today patient appears to be doing better in my opinion compared to his last visit. The good news is there is no signs of active infection systemically and locally I do not see any signs of infection either. He did have an x-ray which was negative that is great news he had a culture which showed MRSA but at the same time he is been on the doxycycline  which has helped. I do think we may want to extend this for 7 additional days 1/25; patient admitted to the clinic a few weeks ago. 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 We have been putting him under compression his edema control is a lot better but he is severe verricused skin on the  left leg. He is really done quite well he still has an open area on the left medial calf and the left medial first metatarsal head. We have been using silver collagen on the leg silver alginate on the foot 07/27/2020 upon evaluation today patient appears to be doing decently well in regard to his wounds. He still has a lot of dry skin on the left leg. Some of this is starting to peel back and I think he may be able to have them out by removing some that today. Fortunately there is no signs of active infection at this time on the left leg although on the right leg he does appear to have swelling and erythema as well as some mild warmth to touch. This does have been concerned about the possibility of cellulitis although within the differential diagnosis I do think that potentially a DVT has to be at least considered. We need to rule that out before proceeding would just call in the cellulitis. Especially since he is having pain in the posterior aspect of his calf muscle. HAWTHORNE, DAY ([id number]) [id number] Page 5 of 22 2/8; the patient had seen sparingly. He has severe skin changes of chronic lymphedema in the left leg thickened hyperkeratotic verrucous skin. He has an open wound on the medial part of the left first met head left mid tibia. He also has a rim of nonepithelialized skin in the anterior mid tibia. He brought in the AmLactin lotion that was been prescribed although I am not sure under compression and its utility. There concern about cellulitis on the right lower leg the last time he was here. He was put on on antibiotics. His DVT rule out was negative. The right leg looks fine he is using his stocking on this area 08/10/2020 upon evaluation today patient appears to be doing well with regard  to his leg currently. He has been tolerating the dressing changes without complication. Fortunately there is no signs of active infection which is great news. Overall very  pleased with where things stand. 2/22; the patient still has an area on the medial part of the left first met his head. This looks better than when I last saw this earlier this month he has a rim of epithelialization but still some surface debris. Mostly everything on the left leg is healed. There is still a vulnerable in the left mid tibia area. 08/30/2020 upon evaluation today patient appears to be doing much better in regard to his wounds on his foot. 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 Nonetheless I think we will address that today I Minna send in a prescription for him in that regard. Overall though there does not appear to be any signs of significant worsening. 09/07/2020 on evaluation today patient's wounds over his left foot appear to be doing excellent. I do not see any signs of infection there is some callus buildup this can require debridement for certain but overall I feel like he is managing quite nicely. He still using the AmLactin cream which has been beneficial for him as well. 3/22; left foot wound is closed. There is no open area here. He is using ammonium lactate  lotion to the lower extremities to help exfoliate dry cracked skin. He has compression stockings from [facility] in [place]. The wound on the medial part of his left first met head is healed today. READMISSION 04/12/2021 Mr. Haseman is a patient we know fairly well he had a prolonged stay in clinic in 2019 with wounds on his left lateral and left anterior lower extremity in the setting of chronic venous insufficiency. More recently he was here earlier this year with predominantly an area on his left foot first metatarsal head plantar and he says the plantar foot broke down on its not long after we discharged him but he did not come back here. The last few months areas of broken  down on his left anterior and again the left lateral lower extremity. The leg itself is very swollen chronically enlarged a lot of hyperkeratotic dry Berry Q skin in the left lower leg. His edema extends well into the thigh. He was seen by Dr. Sheree. He had ABIs on 03/02/2021 showing an ABI on the right of 1 with a TBI of 0.72 his ABI in the left at 1.09 TBI of 0.99. Monophasic and biphasic waveforms on the right. On the left monophasic waveforms were noted he went on to have an angiogram on 03/27/2021 this showed the aortic aortic and iliac segments were free of flow-limiting stenosis the left common femoral vein to evaluate the left femoral to anterior tibial artery bypass was unobstructed the bypass was patent without any areas of stenosis. We discharged the patient in bilateral juxta lite stockings but very clearly that was not sufficient to control the swelling and maintain skin integrity. He is clearly going to need compression pumps. 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 This is fortunate because he is on his feet for long periods of time. 10/27; patient comes in with our intake nurse reporting copious amount of green drainage from the left anterior mid tibia the left  dorsal foot and to a lesser extent the left medial mid tibia. We left the compression wrap on all week for the amount of edema in his left leg is quite a bit better. We use silver alginate as the primary dressing 11/3; edema control is good. Left anterior lower leg left medial lower leg and the plantar first metatarsal head. The left anterior lower leg required debridement. Deep tissue culture I did of this wound showed MRSA I put him on 10 days of doxycycline  which she will start today. We have him in compression wraps. He has a security card and [employer] however he is retiring on November 15. We will need to then get him into a better offloading boot for the left foot perhaps a  total contact cast 11/10; edema control is quite good. Left anterior and left medial lower leg wounds in the setting of chronic venous insufficiency and lymphedema. He also has a substantial area over the left plantar first metatarsal head. I treated him for MRSA that we identified on the major wound on the left anterior mid tibia with doxycycline  and gentamicin topically. He has significant hypergranulation on the left plantar foot wound. The patient is a diabetic but he does not have significant PAD 11/17; edema control is quite good. Left anterior and left medial lower leg wounds look better. The really concerning area remains the area on the left plantar first metatarsal head. He has a rim of epithelialization. 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 Starting today with a forefoot off loader but . possibly a total contact cast. 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 Arrives with a very significant odor from I think all of the wounds on the left leg and the left foot. Because of this I did not put a total contact cast on him today but will could still consider this. His wife was having cataract surgery which is the reason he missed the appointment 12/6. I saw this man 5 days ago with a swelling below the popliteal fossa. I thought he actually might have a Baker's cyst however the DVT rule out study that we could arrange right away was negative the technician told me this was not a ruptured Baker's cyst. We attempted to get this aspirated by under ultrasound guidance in interventional radiology however all they did was an ultrasound however it shows an extensive fluid collection 62 x 8 x 9.4 in the left thigh and left calf. The patient states he thinks this started 8 days  ago or so but he really is not complaining of any pain, fever or systemic symptoms. He has not ha 12/20; after some difficulty I managed to get the patient into see Dr. Sheree. Eventually he was taken into the hospital and had a drain put in the fluid collection below his left knee posteriorly extending into the posterior thigh. He still has the drain in place. Culture of this showed moderate staff aureus few Morganella and few Klebsiella he is now on doxycycline  and ciprofloxacin  as suggested by infectious disease he is on this for a month. The drain will remain in place until it stops draining 12/29; he comes in today with the 1 wound on his left leg and  the area on the left plantar first met head significantly smaller. Both look healthy. He still has the drain in the left leg. He says he has to change this daily. Follows up with Dr. Sheree on January 11. 06/29/2021; the wounds that I am following on the left leg and left first met head continued to be quite healthy. However the area where his inferior drain is in place had copious amounts of drainage which was green in color. The wound here is larger. Follows up with Dr. Gari of vein and vascular his surgeon next week as well as infectious disease. He remains on ciprofloxacin  and doxycycline . He is not complaining of excessive pain in either one of the drain areas 1/12; the patient saw vascular surgery and infectious disease. Vascular surgery has left the drain in place as there was still some notable drainage still see him back in 2 weeks. Dr. Mackie stop the doxycycline  and ciprofloxacin  and I do not believe he follows up with them at this point. Culture I did last week showed both doxycycline  resistant MRSA and Pseudomonas not sensitive to ciprofloxacin  although only in rare titers 1/19; the patient's wound on the left anterior lower leg is just about healed. We have continued healing of the area that was medially on the left leg. Left first plantar  metatarsal head continues to get smaller. The major problem here is his 2 drain sites 1 on the left upper calf and lateral thigh. There is purulent drainage still from the left lateral thigh. I gave him antibiotics last week but we still have recultured. He has the drain in the area I think this is eventually going to have to come out. I suspect there will be a connecting wound to heal here perhaps with improved VAc Rone, Blakely W ([id number]) [id number] Page 6 of 22 1/26; the patient had his drain removed by vein and vascular on 1/25/. This was a large pocket of fluid in his left thigh that seem to tunnel into his left upper calf. 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 His mention his Penrose drain was removed today. He now has a tunneling wound on his left calf and left thigh. Both of these probe widely towards each other although I cannot really prove that they connect. Both wounds on his lower leg anteriorly are closed and his area over the first metatarsal head on his right foot continues to improve. We are using Hydrofera Blue here. He also saw infectious disease culture of the abscess they noted was polymicrobial with MRSA, Morganella and Klebsiella he was treated with doxycycline  and ciprofloxacin  for 4 weeks ending on 07/03/2021. They did not recommend any further antibiotics. Notable that while he still had the Penrose drain in place last week he had purulent drainage coming out of the inferior IandD site this grew [facility], MRSA and Pseudomonas but there does not appear to be any active infection in this area today with the drain out and he is not systemically unwell 2/2; with regards to the drain sites the superior one on the thigh actually is closed down the one on the upper left lateral calf measures about 8 and half centimeters which is an improvement seems to be less prominent although still with a lot of drainage. The only remaining  wound is over the first metatarsal head on the left foot and this looks to be continuing to improve with Hydrofera Blue. 2/9; the area on his plantar left foot continues  to contract. Callus around the wound edge. The drain sites specifically have not come down in depth. We put the wound VAC on Monday he changed the canister late last night our intake nurse reported a pocket of fluid perhaps caused by our compression wraps 2/16; continued improvement in left foot plantar wound. drainage site in the calf is not improved in terms of depth (wound vac) 2/23; continued improvement in the left foot wound over the first metatarsal head. With regards to the drain sites the area on his thigh laterally is healed however the open area on his calf is small in terms of circumference by still probes in by about 15 cm. Within using the wound VAC. Hydrofera Blue on his foot 08/24/2021: The left first metatarsal head wound continues to improve. The wound bed is healthy with just some surrounding callus. Unfortunately the open drain site on his calf remains open and tunnels at least 15 cm (the extent of a Q-tip). This is despite several weeks of wound VAC treatment. Based on reading back through the notes, there has been really no significant change in the depth of the wound, although the orifice is smaller and the more cranial wound on his thigh has closed. I suspect the tunnel tracks nearly all the way to this location. 08/31/2021: Continued improvement in the left first metatarsal head wound. There has been absolutely no improvement to the long tunnel from his open drain site on his calf. We have tried to get him into see vascular surgery sooner to consider the possibility of simply filleting the tract open and allowing it to heal from the bottom up, likely with a wound VAC. They have not yet scheduled a sooner appointment than his current mid April 09/14/2021: He was seen by vascular surgery and they took him to the  operating room last week. They opened a portion of the tunnel, but did not extend the entire length of the known open subcutaneous tract. I read Dr. Claretta operative note and it is not clear from that documentation why only a portion of the tract was opened. The heaped up granulation tissue was curetted and removed from at least some portion of the tract. They did place a wound VAC and applied an Unna boot to the leg. The ulcer on his left first metatarsal head is smaller today. The bed looks good and there is just a small amount of surrounding callus. 09/21/2021: The ulcer on his left first metatarsal head looks to be stalled. There is some callus surrounding the wound but the wound bed itself does not appear particularly dynamic. The tunnel tract on his lateral left leg seems to be roughly the same length or perhaps slightly smaller but the wound bed appears healthy with good granulation tissue. He opened up a new wound on his medial thigh and the site of a prior surgical incision. He says that he did this unconsciously in his sleep by scratching. 09/28/2021: Unfortunately, the ulcer on his left first metatarsal head has extended underneath the callus toward the dorsum of his foot. The medial thigh wounds are roughly the same. The tunnel on his lateral left leg continues to be problematic; it is longer than we are able to actually probe with a Q-tip. I am still not certain as to why Dr. Sheree did not open this up entirely when he took the patient to the operating room. We will likely be back in the same situation with just a small superficial opening in a long  unhealed tract, as the open portion is granulating in nicely. 10/02/2021: The patient was initially scheduled for a nurse visit, but we are also applying a total contact cast today. The plantar foot wound looks clean without significant accumulated callus. We have been applying Prisma silver collagen to the site. 10/05/2021: The patient is here for  his first total contact cast change. We have tried using gauze packing strips in the tunnel on his lateral leg wound, but this does not seem to be working any better than the white VAC foam. The foot ulcer looks about the same with minimal periwound callus. Medial thigh wound is clean with just some overlying eschar. 10/12/2021: The plantar foot wound is stable without any significant accumulation of periwound callus. The surface is viable with good granulation tissue. The medial thigh wounds are much smaller and are epithelializing. On the other hand, he had purulent drainage coming from the tunnel on his lateral leg. He does go back to see Dr. Sheree next week and is planning to ask him why the wound tunnel was not completely opened at the time of his most recent operation. 10/19/2021: The plantar foot wound is markedly improved and has epithelial tissue coming through the surface. The medial thigh wounds are nearly closed with just a tiny open area. He did see Dr. Sheree earlier this week and apparently they did discuss the possibility of opening the sinus tract further and enabling a wound VAC application. Apparently there are some limits as to what Dr. 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 I think if we could get the tract open to the level of the popliteal fossa, this would greatly aid in her ability to get this chart closed. That being said, however, today when I probed the tract with a Q-tip, I was not able to insert the entirety of the Q-tip as I have on previous occasions. The tunnel is shorter by about 4 cm. The surface is clean with good granulation tissue and no further episodes of purulent drainage. 10/30/2021: Last week, the patient underwent surgery and had the long tract in his leg opened. There was a rind that was debrided, according to the operative report. His medial thigh ulcers are closed. The plantar foot wound is clean with a good surface and  some built up surrounding callus. 11/06/2021: The overall dimensions of the large wound on his lateral leg remain about the same, but there is good granulation tissue present and the tunneling is a little bit shorter. He has a new wound on his anterior tibial surface, in the same location where he had a similar lesion in the past. The plantar foot wound is clean with some buildup surrounding callus. Just toward the medial aspect of his foot, however, there is an area of darkening that once debrided, revealed another opening in the skin surface. 11/13/2021: The anterior tibial surface wound is closed. The plantar foot wound has some surrounding callus buildup. The area of darkening that I debrided last week and revealed an opening in the skin surface has closed again. The tunnel in the large wound on his lateral leg has come in by about 3 cm. There is healthy granulation tissue on the entire wound surface. 11/23/2021: The patient was out of town last week and did wet-to-dry dressings on his large wound. 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 Unfortunately, he picked open the wound on his  left medial thigh. He says that it was itching and he just could not stop scratching it until it was open again. The wound on his plantar foot is smaller and has not accumulated a tremendous amount of callus. The lateral leg wound is shallower and the tunnel has also decreased in depth. There is just a little bit of slough accumulation on the surface. 11/30/2021: Another portion of his left medial thigh has been opened up. All of these wounds are fairly superficial with just a little bit of slough and eschar accumulation. The wound on his plantar foot is almost closed with just a bit of eschar and periwound callus accumulation. The lateral leg wound is nearly flush with the surrounding skin and the tunnel is markedly shallower. 12/07/2021: There is just 1  open area on his left medial thigh. It is clean with just a little bit of perimeter eschar. The wound on his plantar foot continues to contract and just has some eschar and periwound callus accumulation. The lateral leg wound is closing at the more distal aspect and the tunnel is smaller. The surface is nearly flush with the surrounding skin and it has a good bed of granulation tissue. 12/14/2021: The thigh and foot wounds are closed. The lateral leg wound has closed over approximately half of its length. The tunnel continues to contract and the surface is now flush with the surrounding skin. The wound bed has robust granulation tissue. MASSON, NALEPA ([id number]) [id number] Page 7 of 22 12/22/2021: The thigh and foot wounds have reopened. The foot wound has a lot of callus accumulation around and over it. The thigh wound is tiny with just a little bit of slough in the wound bed. The lateral leg wound continues to contract. His vascular surgeon took the wound VAC off earlier in the week and the patient has been doing wet-to-dry dressings. There is a little slough accumulation on the surface. The tunnel is about 3 cm in depth at this point. 12/28/2021: The thigh wound is closed again. The foot wound has some callus that subsequently has peeled back exposing just a small slit of a wound. The lateral leg wound Is down to about half the size that it originally was and the tunnel is down to about half a centimeter in depth. 01/04/2022: The thigh wound remains closed. The foot wound has heavy callus overlying the wound site. Once this was debrided, the wound was found to be closed. The lateral leg wound is smaller again this week and very superficial. No tunnel could be identified. 01/12/2022: The thigh and foot wounds both remain closed. The lateral leg wound is now nearly flush with the skin surface. There is good granulation tissue present with a light layer of slough. 01/19/2022:  Due to the way his wrap was placed, the patient did not change the dressing on his thigh at all and so the foam was saturated and his skin is macerated. There is a light layer of slough on the wound surface. The underlying granulation tissue is robust and healthy-appearing. He has heavy callus buildup at the site of his first metatarsal head wound which is still healed. 02/01/2022: He has been in silver alginate. When he removed the dressing from his thigh wound, however, some leg, superficially reopening a portion of the wound that had healed. In addition, underneath the callus at his left first metatarsal head, there appears to be a blister and the wound appears to be open again. 02/08/2022: The lateral leg wound  has contracted substantially. There is eschar and a light layer of slough present. He says that it is starting to pull and is uncomfortable. On inspection, there is some puckering of the scar and the eschar is quite dry; this may account for his symptoms. On his first metatarsal head, the wound is much smaller with just some eschar on the surface. The callus has not reaccumulated. He reports that he had a blister come up on his medial thigh wound at the distal aspect. It popped and there is now an opening in his skin again. Looking back through his library of wound photos, there is what looks like a permanent suture just deep to this location and it may be trying to erode through. We have been using silver alginate on his wounds. 02/15/2022: The lateral leg wound is about half the size it was last week. It is clean with just a little perimeter eschar and light slough. The wound on his first metatarsal head is about the same with heavy callus overlying it. The medial thigh wound is closed again. He does have some skin changes on the top of his foot that looks potentially yeast related. 02/22/2022: The skin on the top of his foot improved with the use of a topical antifungal. The lateral leg wound  continues to contract and is again smaller this week. There is a little bit of slough and eschar on the surface. The first metatarsal head wound is a little bit smaller but has reaccumulated a thick callus over the top. He decided to try to trim his toenail and ultimately took the entire nail off of his left great toe. 03/02/2022: His lateral leg wound continues to improve, as does the wound on his left great toe. Unfortunately, it appears that somehow his foot got wet and moisture seeped in through the opening causing his skin to lift. There is a large wound now overlying his first metatarsal on both the plantar, medial, and dorsal portion of his foot. There is necrotic tissue and slough present underneath the shaggy macerated skin. 03/08/2022: The lateral leg wound is smaller again today. There is just a light layer of slough and eschar on the surface. The great toe wound is smaller again today. The first metatarsal wound is a little bit smaller today and does not look nearly as necrotic and macerated. There is still slough and nonviable tissue present. 03/15/2022: The lateral leg wound is narrower and just has a little bit of light slough buildup. The first metatarsal wound still has a fair amount of moisture affecting the periwound skin. The great toe wound is healed. 03/22/2022: The lateral leg wound is now isolated to just at the level of his knee. There is some eschar and slough accumulation. The first metatarsal head wound has epithelialized tremendously and is about half the size that it was last week. He still has some maceration on the top of his foot and a fungal odor is present. 03/29/2022: T oday the patient's foot was macerated, suggesting that the cast got wet. The patient has also been picking at his dry skin and has enlarged the wound on his left lateral leg. In the time between having his cast removed and my evaluation, he had picked more dry skin and opened up additional wounds on his  Achilles area and dorsal foot. The plantar first metatarsal head wound, however, is smaller and clean with just macerated callus around the perimeter and light slough on the surface. The lateral leg wound  measured a little bit larger but is also fairly clean with eschar and minimal slough. 04/02/2022: The patient had vascular studies done last Friday and so his cast was not applied. He is here today to have that done. 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 04/05/2022: Both wounds are smaller and quite clean. There is just a little biofilm on the lateral leg wound. 10/20; the patient has a wound on the left lateral surgical incision at the level of his lateral knee this looks clean and improved. He is using silver alginate. He also has an area on his left medial foot for which she is using Hydrofera Blue under a total contact cast both wounds are measuring smaller 04/20/2022: The plantar foot wound has contracted considerably and is very close to closing. The lateral leg wound was measured a little larger, but there was a tiny open area that was included in the measurements that was not included last week. He has some eschar around the perimeter but otherwise the wound looks clean. 04/27/2022: The lateral leg wound looks better this week. He says that midweek, he felt it was very dry and began applying hydrogel to the site. I think this was beneficial. The foot wound is nearly closed underneath a thick layer of dry skin and callus. 05/04/2022: The foot wound is healed. He has developed a new small ulcer on his anterior tibial surface about midway up his leg. It has a little slough on the surface. The lateral leg wound still is fairly dry, but clean with just a little biofilm on the surface. 05/11/2022: The wound on his foot reopened on Wednesday. A large blister formed which then broke open revealing the fat layer underneath. The ulcer on his anterior tibial surface is a little bit larger  this week. The lateral leg wound has much better moisture balance this week. Fortunately, prior to his foot wound reopening, he did get the cast made for his orthotic. 05/15/2022: Already, the left medial foot wound has improved. The tissue is less macerated and the surface is clean. The ulcer on his anterior tibial surface continues to enlarge. This seems likely secondary to accumulated moisture. The lateral leg wound continues to have an improved moisture balance with the use of collagen. 05/25/2022: The medial foot wound continues to contract. It is now substantially smaller with just a little slough on the surface. The anterior tibial surface wound continues to enlarge further. Once again, this seems to be secondary to moisture. The lateral leg wound does not seem to be changing much in size, but the moisture balance is better. 06/01/2022: The anterior tibial wound is closed. The medial foot wound is down to just a very small, couple of millimeters, opening. The lateral leg wound has good moisture balance, but remains unchanged in size. 12/15; the patient's anterior tibial wound has reopened, however the area on his right first metatarsal head is closed. The major wound is actually on the superior part of his surgical wound in the left lateral thigh. Not a completely viable surface under illumination. This may at some point require a debridement I think he is currently using Prisma. As noted the left medial foot wound has closed MOISHY, LADAY ([id number]) [id number] Page 8 of 22 06/14/2022: The anterior tibial wound has closed. The lateral leg wound has a better surface but is basically unchanged in size. The left medial foot wound has reopened. It looks as though there was some callus accumulation and moisture got under  the callus which caused the tissue to break down again. 06/21/2022: A new wound has opened up just distal to the previous anterior tibial wound. It is  small but has hypertrophic granulation tissue present. The lateral leg wound is a little bit narrower and has a layer of slough on the surface. The left medial foot wound is down to just a pinhole. His custom orthotics should be available next week. 06/28/2022: The wound on his first metatarsal head has healed. He has developed a new small wound on his medial lower leg, in an old scar site. The lateral leg wound continues to contract but continues to accumulate slough, as well. 07/03/2022: Despite wearing his custom orthopedic shoes, he managed to reopen the wound on his first metatarsal head. He says he thinks his foot got wet and then some skin lifted up and he peeled this away. Both of the lower leg wounds are smaller and have some dry eschar on the surface. The lateral leg wound is quite a bit narrower today. 07/12/2022: The medial lower leg wound is closed. The anterior lower leg wound has contracted considerably. The lateral upper leg wound is narrower with a layer of slough on the surface. The first metatarsal head wound is also smaller, but had copious drainage which saturated the foam border dressing and resulted in some periwound tissue maceration. Fortunately there was no breakdown at this site. 07/19/2022: The lower leg shows signs of significant maceration; I think he must be sweating excessively inside his cast. There are several areas of skin breakdown present. The wound on his foot is smaller and that on his lateral leg is narrower and is shorter by about a centimeter. 07/26/2022: Last week we used a zinc Coflex wrap prior to applying his total contact cast and this has had the effect of keeping his skin from getting macerated this week. The anterior leg wound has epithelialized substantially. The lateral leg wound is significantly smaller with just a bit of slough on the surface. The first metatarsal head wound is also smaller this week. 08/02/2022: The anterior leg wound was closed on  arrival, but while he was sitting in the room, he picked it open again. The lateral leg wound is smaller with just a little slough on the surface and the first metatarsal head wound has contracted further, as well. 08/09/2022: The first metatarsal head wound is covered with callus. Underneath the callus, it is nearly completely closed. The lateral leg wound is smaller again this week. The anterior leg wound looks better, but he has such heavy buildup of old skin, that moisture is getting underneath this, becoming trapped, and causing the underlying skin to get macerated and open up. 08/16/2022: The first metatarsal head wound is closed. The lateral leg wound continues to contract and is quite a bit smaller again this week. There is just a small, superficial opening remaining on his anterior tibial surface. 08/23/2022: The first metatarsal head wound has, by some miracle, remained closed. The lateral leg wound is substantially smaller with multiple areas of epithelialization. The anterior tibial surface wound is also quite a bit smaller and very clean. 08/30/2022: Unfortunately, his first metatarsal head wound opened up again. It happened in the same fashion as it has on prior occasions. Moisture got under dried skin/callus and created a wound when he removed his sock, taking the skin with it. The anterior tibial surface has a thick shell of hyperkeratotic skin. This has been contributing to ongoing repeat wounding events as moisture  gets underneath this and causes tissue breakdown. 3/15; patient presents for follow-up. His anterior left leg wound has healed. He still has the wound to the left lateral aspect and left first met head. We have been using silver alginate and endoform to these areas under Foot [facility]. He has no issues or complaints today. He has been taking Augmentin  and reports improvement to his symptoms to the left first met head. 09/13/2022: He has accumulated more thick dry skin in sheets on  his lower leg. The lateral leg wound is about the same size and the left first metatarsal head wound is a little bit smaller. There is slough on both surfaces. There is callus buildup around the foot wound. 09/20/2022: The lateral leg wound is a little bit narrower and the left first metatarsal head wound also seems to have contracted slightly. There is slough on both surfaces. He has a little skin breakdown on his anterior tibial surface. 09/27/2022: The lateral leg wound continues to contract and is quite clean. The first metatarsal head wound is also smaller. There is some perimeter callus and slough accumulation on the foot. The anterior tibial surface is closed. 10/04/2022: Both of his wounds are smaller today, particularly the first metatarsal head wound. 10/18/2022: He missed his appointment last week and ended up cutting off his wrap on Saturday. The anterior tibial wound reopened. It is fairly superficial with a little bit of slough on the surface. His lateral leg wound is smaller with some slough and eschar buildup. The first metatarsal head wound is also smaller with some callus and slough accumulation. 10/25/2022: All wounds are smaller. There is slough and eschar on the lateral leg and slough and callus on the plantar foot wound. The anterior tibial wound is clean and flush with the surrounding skin. No debris accumulation here. 11/01/2022: The wounds are all smaller again this week. There is slough on the lateral leg and some minimal slough and eschar on the anterior tibial wound. There is callus accumulation on the first metatarsal head site, along with slough. There is also a yeasty odor coming from the foot. 11/08/2022: The lateral leg wound is smaller except where he picked some skin while waiting to be seen. There is a little bit of slough on the surface. The anterior tibial wound is closed. The first metatarsal head site has gotten macerated once again and has a lot of spongy wet tissue and  callus around it. No yeast odor today. 11/15/2022: The lateral leg wound continues to contract. There is now a band of epithelium dividing it into 2 areas. Minimal slough accumulation. He managed to pick open a new wound on his medial lower leg. The first metatarsal head site is smaller with some callus accumulation but no tissue maceration. 11/22/2022: The lateral leg wound is smaller again by about a third today. There is a little bit of slough on the surface with some periwound eschar. The new wound that he picked open last week has healed but he picked open 3 new small wounds on his anterior tibial surface, once again due to his picking at his skin. The first metatarsal head wound looks about the same. There has been some callus accumulation and there is more edema present, as he was not put in a compression wrap last week. 11/29/2022: The lateral leg wound is smaller again today. There is a little periwound eschar and some slough present. The wounds on his anterior tibial surface are all closed except for 1 that has  a little bit of slough on the open portion with eschar covering it. The first metatarsal head wound measured a little bit smaller today, but mostly looks about the same. Edema control is better. 12/06/2022: The anterior tibial wound is closed. The lateral leg wound is smaller with some slough and eschar accumulation. The plantar foot ulcer is about the same size, but the tissue does show some evidence of the fact that he was on his feet quite a bit more this past week; there was a death in his family. There has been more callus accumulation and the tissues are little bit more purpleish. 12/13/2022: He picked the skin on his anterior tibia and reopened a wound there while he was waiting to be seen in clinic. The lateral leg wound is much smaller with a little bit of slough and eschar. The plantar foot ulcer is smaller today with callus and slough buildup, but without the pressure induced  tissue injury that was seen last week. 12/20/2022: The anterior tibial wound has healed. The lateral leg wound is smaller again this week. There is a little bit of eschar buildup on the surface. The foot FAWAZ, BORQUEZ ([id number]) [phone].pdf Page 9 of 22 had a fairly strong odor coming from it that persisted even after washing. The wound itself looks like its gotten a little smaller but he has built up thick callus, once again. 12/26/2022: The lateral leg wound is down to just a narrow superficial slit. There is slough and a little bit of dry skin present. The foot is in much better shape today. There is less callus accumulation and no odor. The skin edges are starting to roll inward, however. 01/03/2023: The lateral leg wound is healed. The skin is quite dry but the wound has closed. The foot continues to contract. He has a fair amount of periwound callus accumulation and the surface of the is a little drier than ideal. 01/10/2023: The large lateral leg wound remains closed. He managed to pick off some of his dry skin and has multiple small open superficial wounds on his lower leg. The foot wound measures smaller, but he has a blister immediately adjacent to it. 01/17/2023: The large lateral leg wound remains closed. The small superficial wounds on his lower leg have also closed. The foot wound is about the same and the blister area immediately adjacent to it has dark eschar on the surface. The foot wound looks a bit dry. 01/24/2023: He picked at some dry skin on his lateral leg wound and reopened. The surface is dry and fibrotic. The foot wound is slightly smaller but has periwound callus and the edges are rolling inward again. 01/31/2023: His lateral leg wound is larger again today. It appears that the Ace bandage may be rubbing and causing some tissue breakdown. His foot wound is also a bit larger and the tissue does not appear particularly healthy. 02/07/2023: The lateral leg  wound improved quite significantly over the last week. He has been applying additional gauze over the site before applying the Ace bandage and this seems to have minimized the rubbing. The foot wound is about the same. The culture that I took last week returned with a polymicrobial population of predominantly skin flora. He is currently taking Augmentin  as recommended by the culture data. 02/14/2023: The lateral leg wound is covered in a layer of eschar. Underneath, the wound is nearly healed again. The foot wound looks better today. The depth around the edges has decreased. The tissue has  a healthier appearance. He is still taking Augmentin . 02/21/2023: The lateral leg wound is smaller with a layer of eschar on the surface, but is not quite healed. The foot wound is stable but the depth around the edges is almost completely obliterated. There is some callus and senescent skin around the edges. 03/07/2023: The lateral leg wound is down to just a pinhole with a little bit of eschar on the surface. The foot wound demonstrates evidence that he has been walking excessively the past week and there is more callus with moisture underneath it, but the breakdown is limited to the callus layer and there is still intact skin underneath. 03/14/2023: The lateral leg wound is closed. The foot wound looks much improved this week. There is very minimal callus accumulation around the wound edges and the tissue surface appears healthier. It seems the adjustments made to his shoe by the [facility] were effective. 03/28/2023: The lateral leg wound has reopened. The patient reports that he was not moisturizing it as much as he probably should have been and it cracked and some skin peeled away. The foot wound shows signs of pressure-related trauma with some bruising and increased callus. The patient admits to extensive walking this past week while taking his wife to medical appointments. He does have a knee scooter and it is  unclear why he does not use it. 04/04/2023: The lateral leg wound is larger again today. The patient reports that he thinks maybe some dry skin pulled off when he was changing his dressing. It is quite dry and he states that he is using just water to moisten his endoform, rather than the hydrogel that was recommended. The foot wound is the same size, but the tissue is in better condition, without substantial bruising or friction-related trauma. 04/11/2023: Both wounds look better this week. Much of the lateral leg wound has epithelialized. The moisture balance is much better. The foot wound looks better than it has in months. The granulation tissue is flush with the surrounding skin and there is not much callus accumulation. 04/25/2023: The patient was out of town last week. The lateral leg ulcer has good moisture balance, but has opened up a little bit more at the most proximal aspect. The foot ulcer has a layer of callus around the outside but is otherwise fairly clean with minimal slough buildup. 05/02/2023: He has 2 new wounds today. He has picked at the skin on his left anterior tibial surface and opened up the wound location. He picked a callus on his medial foot adjacent to his existing metatarsal head wound and opened up the site there. The lateral leg wound looks a little bit better this week and the moisture balance is excellent. The metatarsal head wound has a little bit of breakdown at the more proximal end but otherwise is also making improvement. 05/09/2023: Both foot ulcers are smaller and look better today. The lower leg ulcer is also smaller. The lateral leg ulcer is basically the same size. All of the wounds have slough on the surface and there is some callus accumulation around the plantar first metatarsal head wound. 05/16/2023: The smaller of the foot ulcers is nearly healed. The plantar foot ulcer is stable. The lower leg ulcer is smaller. The lateral leg ulcer is a little  bit smaller and the moisture balance is good. There is slough on all of the wound surfaces. 06/13/2023: The small, more dorsal foot wounds have epithelialized. The plantar foot ulcer is stable in size,  but the tissue appears healthier. The lower leg ulcer is about the same size. The lateral leg ulcer is larger today. He reports that he had it nearly healed and the skin peeled off again as it got dry. 06/20/2023: The smaller more dorsal foot wounds are open again, but they are tiny. The plantar foot ulcer is stable, but the surface is looking a little bit dry. The lower leg ulcer is unchanged. The lateral leg ulcer is a little bit smaller today, but he has an area just distal to the wound where he says some tape pulled off, resulting in a wound. No concern for infection at any of the locations. 06/27/2023: The small dorsal foot wounds are closed again. The plantar foot ulcer is a little bit bigger today with more callus around the edges and senescent skin at the margins. It is clean without any significant odor. The anterior tibial wound is measuring slightly larger today, but is otherwise unchanged. The thigh ulcer measured slightly smaller today. There is dry skin around the edges and slough on the surface. Electronic Signature(s) Signed: 06/27/2023 8:31:04 AM By: Marolyn Nest MD FACS Entered By: Marolyn Nest on 06/27/2023 05:31:04 -------------------------------------------------------------------------------- Physical Exam Details Patient Name: Date of Service: EZZARD [name] LELON. 06/27/2023 7:45 A CHRISTELLA EZZARD UBALDO LELON ([id number]) [id number] Page 10 of 22 Medical Record Number: [id number] Patient Account Number: [id number] Date of Birth/Sex: Treating RN: 1951-04-17 (73 y.o. M) Primary Care Provider: Valma Carwin Other Clinician: Referring Provider: Treating Provider/Extender: Marolyn Nest Valma Carwin Devra in Treatment: 115 Constitutional Hypertensive,  asymptomatic. . . . no acute distress. Respiratory Normal work of breathing on room air.. Notes 06/27/2023: The small dorsal foot wounds are closed again. The plantar foot ulcer is a little bit bigger today with more callus around the edges and senescent skin at the margins. It is clean without any significant odor. The anterior tibial wound is measuring slightly larger today, but is otherwise unchanged. The thigh ulcer measured slightly smaller today. There is dry skin around the edges and slough on the surface. Electronic Signature(s) Signed: 06/27/2023 8:31:53 AM By: Marolyn Nest MD FACS Entered By: Marolyn Nest on 06/27/2023 05:31:53 -------------------------------------------------------------------------------- Physician Orders Details Patient Name: Date of Service: EZZARD [name] LELON. 06/27/2023 7:45 A M Medical Record Number: [id number] Patient Account Number: [id number] Date of Birth/Sex: Treating RN: [birth year] (73 y.o. 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 - Dr. Marolyn Return A 07/04/23 at 10:00am Anesthetic (In clinic) Topical Lidocaine  4% applied to wound bed Bathing/ Shower/ Hygiene May shower and  wash wound with soap and water. Edema Control - Orders / Instructions Elevate legs to the level of the heart or above for 30 minutes daily and/or when sitting for 3-4 times a day throughout the day. Avoid standing for long periods of time. Patient to wear own compression stockings every day. Moisturize legs daily. Off-Loading Wound #18R Left,Plantar Metatarsal head  first Other: - custom diabetic shoes Additional Orders / Instructions Follow Nutritious Diet - vitamin C 500 mg 3 times a day and zinc 30-50 mg a day TAVION, SENKBEIL ([id number]) [phone].pdf Page 11 of 22 Wound Treatment Wound #18R - Metatarsal head first Wound Laterality: Plantar, Left Cleanser: Normal Saline 1 x Per Day/30 Days Discharge Instructions: Cleanse the wound with Normal Saline prior to applying a clean dressing using gauze sponges, not tissue or cotton balls. Cleanser: Soap and Water 1 x Per Day/30 Days Discharge Instructions: May shower and wash wound with dial  antibacterial soap and water prior to dressing change. Cleanser: Wound Cleanser 1 x Per Day/30 Days Discharge Instructions: Cleanse the wound with wound cleanser prior to applying a clean dressing using gauze sponges, not tissue or cotton balls. Topical: Skintegrity Hydrogel 4 (oz) 1 x Per Day/30 Days Discharge Instructions: Apply hydrogel as directed Prim Dressing: Promogran Prisma Matrix, 4.34 (sq in) (silver collagen) 1 x Per Day/30 Days ary Discharge Instructions: Moisten collagen with saline or hydrogel Secondary Dressing: Optifoam Non-Adhesive Dressing, 4x4 in (Generic) 1 x Per Day/30 Days Discharge Instructions: Apply over primary dressing as directed. Secondary Dressing: Woven Gauze Sponges 2x2 in (Generic) 1 x Per Day/30 Days Discharge Instructions: Apply over primary dressing as directed. Secured With: 18M Medipore H Soft Cloth Surgical T ape, 4 x 10 (in/yd) 1 x Per Day/30 Days Discharge Instructions: Secure with tape as  directed. Wound #22R - Lower Leg Wound Laterality: Left, Lateral, Proximal Cleanser: Soap and Water 1 x Per Day/30 Days Discharge Instructions: May shower and wash wound with dial  antibacterial soap and water prior to dressing change. Cleanser: Wound Cleanser 1 x Per Day/30 Days Discharge Instructions: Cleanse the wound with wound cleanser prior to applying a clean dressing using gauze sponges, not tissue or cotton balls. Topical: Skintegrity Hydrogel 4 (oz) 1 x Per Day/30 Days Discharge Instructions: Apply hydrogel as directed Prim Dressing: Promogran Prisma Matrix, 4.34 (sq in) (silver collagen) 1 x Per Day/30 Days ary Discharge Instructions: Moisten collagen with saline or hydrogel Secondary Dressing: Woven Gauze Sponge, Non-Sterile 4x4 in 1 x Per Day/30 Days Discharge Instructions: Apply over primary dressing as directed. Secured With: 18M Medipore H Soft Cloth Surgical T ape, 4 x 10 (in/yd) 1 x Per Day/30 Days Discharge Instructions: Secure with tape as directed. Wound #36 - Lower Leg Wound Laterality: Left, Anterior Cleanser: Soap and Water 1 x Per Day/30 Days Discharge Instructions: May shower and wash wound with dial  antibacterial soap and water prior to dressing change. Cleanser: Wound Cleanser 1 x Per Day/30 Days Discharge Instructions: Cleanse the wound with wound cleanser prior to applying a clean dressing using gauze sponges, not tissue or cotton balls. Prim Dressing: Maxorb Extra Ag+ Alginate Dressing, 2x2 (in/in) 1 x Per Day/30 Days ary Discharge Instructions: Apply to wound bed as instructed Secondary Dressing: Zetuvit Plus Silicone Border Dressing 4x4 (in/in) 1 x Per Day/30 Days Discharge Instructions: Apply silicone border over primary dressing as directed. Electronic Signature(s) Signed: 06/27/2023 8:44:52 AM By: Marolyn Nest MD FACS Signed: 06/27/2023 9:43:28 AM By: Claven Pollen RN Entered By: Claven Pollen on 06/27/2023 05:35:14 Problem List  Details -------------------------------------------------------------------------------- EZZARD UBALDO ORN ([id number]) [id number] Page 12 of 22 Patient Name: Date of Service: MIKA, ANASTASI 06/27/2023 7:45 A M Medical Record Number: [id number] Patient Account Number: [id number] Date of Birth/Sex: Treating RN: 04-18-1951 (73 y.o. 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 03/28/2023 No Yes L97.828 Non-pressure chronic ulcer of other part of left lower leg with other specified 05/02/2023 No Yes severity I89.0 Lymphedema, not elsewhere classified 04/12/2021 No Yes I87.322 Chronic venous hypertension (idiopathic) with inflammation of left lower 04/12/2021 No Yes extremity L85.9 Epidermal thickening, unspecified 08/30/2022 No Yes E11.51 Type 2 diabetes mellitus with diabetic peripheral angiopathy without gangrene 04/12/2021 No Yes Inactive Problems ICD-10 Code Description Active Date Inactive Date E11.621 Type 2 diabetes mellitus with foot ulcer 04/12/2021 04/12/2021 E11.42 Type 2 diabetes mellitus with diabetic polyneuropathy 04/12/2021 04/12/2021 L02.416 Cutaneous abscess of left lower limb 06/13/2021 06/13/2021 Resolved Problems ICD-10 Code Description Active Date Resolved Date L97.828 Non-pressure chronic ulcer of other part of left lower leg with other specified severity 01/10/2023 06/08/2022 Electronic Signature(s) Signed: 06/27/2023 8:28:19 AM By: Marolyn Nest MD FACS Entered By: Marolyn Nest on 06/27/2023 05:28:19 EZZARD UBALDO ORN ([id number])  [id number] Page 13 of 22 -------------------------------------------------------------------------------- Progress Note Details Patient Name: Date of Service: ELDRIDGE, MARCOTT 06/27/2023 7:45 A M Medical Record Number: [id number] Patient Account Number: [id number] Date of Birth/Sex: Treating RN: [birth year] (73 y.o. M) Primary Care Provider: Valma Carwin Other Clinician: Referring Provider: Treating Provider/Extender: Marolyn Nest Valma Carwin Devra in Treatment: 115 Subjective Chief Complaint Information obtained from Patient Left leg and foot ulcers 04/12/2021; patient is here for wounds on his left lower leg and left plantar foot over the first metatarsal head History of Present Illness (HPI) 10/11/17; Mr. Hillhouse is a 73 year old man who tells me that in 2015 he slipped down the latter traumatizing his left leg. He developed a wound in the same spot the area that we are currently looking at. He states this closed over for the most part although he always felt it was somewhat unstable. In 2016 he hit the same area with the door of his car had this reopened. He tells me that this is never really closed although sometimes an inflow it remains open on a constant basis. He has not been using any specific dressing to this except for topical antibiotics the nature of which were not really sure. His primary doctor did send him to see Dr. Ladona of interventional cardiology. He underwent an angiogram on 08/06/17 and he underwent a PTA and directional atherectomy of the lesser distal SFA and popliteal arteries which resulted in brisk improvement in blood flow. It was noted that he had 2 vessel runoff through the anterior tibial and peroneal. He is also been to see vascular and interventional radiologist. He was not felt to have any significant superficial venous insufficiency. Presumably is not a candidate for any ablation. It was suggested he come here for wound care. The  patient is a type II diabetic on insulin . He also has a history of venous insufficiency. ABIs on the left were noncompressible in our clinic 10/21/17; patient we admitted to the clinic last week. He has a fairly large chronic ulcer on the left lateral calf in the setting of chronic venous insufficiency. We put Iodosorb on him after an aggressive debridement and 3 layer compression. He complained of pain in his ankle and itching with is skin in fact he scratched the area on the medial calf superiorly at the rim of our wraps and he has 2 small open areas in that location today which  are new. I changed his primary dressing today to silver collagen. As noted he is already had revascularization and does not have any significant superficial venous insufficiency that would be amenable to ablation 10/28/17; patient admitted to the clinic 2 weeks ago. He has a smaller Wound. Scratch injury from last week revealed. There is large wound over the tibial area. This is smaller. Granulation looks healthy. No need for debridement. 11/04/17; the wound on the left lateral calf looks better. Improved dimensions. Surface of this looks better. We've been maintaining him and Kerlix Coban wraps. He finds this much more comfortable. Silver collagen dressing 11/11/17; left lateral Wound continues to look healthy be making progress. Using a #5 curet I removed removed nonviable skin from the surface of the wound and then necrotic debris from the wound surface. Surface of the wound continues to look healthy. He also has an open area on the left great toenail bed. We've been using topical antibiotics. 11/19/17; left anterior lateral wound continues to look healthy but it's not closed. He also had a small wound above this on the left leg Initially traumatic wounds in the setting of significant chronic venous insufficiency and stasis dermatitis 11/25/17; left anterior wounds superiorly is closed still a small wound inferiorly. 12/02/17;  left anterior tibial area. Arrives today with adherent callus. Post debridement clearly not completely closed. Hydrofera Blue under 3 layer compression. 12/09/17; left anterior tibia. Circumferential eschar however the wound bed looks stable to improved. We've been using Hydrofera Blue under 3 layer compression 12/17/17; left anterior tibia. Apparently this was felt to be closed however when the wrap was taken off there is a skin tear to reopen wounds in the same area we've been using Hydrofera Blue under 3 layer compression 12/23/17 left anterior tibia. Not close to close this week apparently the Hydrofera Blue was stuck to this again. Still circumferential eschar requiring debridement. I put a contact layer on this this time under the Hydrofera Blue 12/31/17; left anterior tibia. Wound is better slight amount of hyper-granulation. Using Hydrofera Blue over Adaptic. 01/07/18; left anterior tibia. The wound had some surface eschar however after this was removed he has no open wound.he was already revascularized by Dr. Ladona when he came to our clinic with atherectomy of the left SFA and popliteal artery. He was also sent to interventional radiology for venous reflux studies. He was not felt to have significant reflux but certainly has chronic venous changes of his skin with hemosiderin deposition around this area. He will definitely need to lubricate his skin and wear compression stocking and I've talked to him about this. READMISSION 05/26/2018 This is a now 73 year old man we cared for with traumatic wounds on his left anterior lower extremity. He had been previously revascularized during that admission by Dr. Ladona. Apparently in follow-up Dr. Ladona noted that he had deterioration in his arterial status. He underwent a stent placement in the distal left SFA on 04/22/2018. 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 He went back to the angiography suite on 04/30/2018 he underwent PTA  and balloon angioplasty of the occluded left mid anterior tibial artery, thrombotic occlusion went from 100 to 0% which reconstitutes the posterior tibial artery. He had thrombectomy and aspiration of the peroneal artery. The stent placed in the distal SFA left SFA was still occluded. He was discharged on Xarelto , it was noted on the discharge summary from this hospitalization that he had gangrene at the tip of his left fifth toe and there were expectations  this would auto amputate. 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 He has been recuperating at [facility] in [facility] after the most recent hospitalization. He is going home tomorrow. He tells me that 2 weeks ago he traumatized the tip of his left fifth toe. He came in urgently for our review of this. This was a history of before I noted that Dr. Ladona had already noted dry gangrenous changes of the left fifth toe 06/09/2018; 2-week follow-up. I did contact Dr. Ladona after his last appointment and he apparently saw 1 of Dr. Godfrey colleagues the next day. He does not follow-up with Dr. Ladona himself until Thursday of this week. 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 Nevertheless there is no evidence of infection no drainage and no pain. He had a new area that this week when we were signing him in today on the left anterior mid tibia area, this is in close proximity to the previous wound we have dealt with in this clinic. 06/23/2018; 2-week follow-up. I did not receive a recent note from Dr. Ladona to review today. Our office is trying to obtain this. He is apparently not planning to do further vascular interventions and wondered about compression to try and help with the patient's chronic venous insufficiency. However we are also concerned about the arterial flow. He arrives in clinic today with a new area on the left third toe. The areas on the calf/anterior  tibia are close to closing. The left fifth toe is still mummified [name], GAPPA ([id number]) [id number] Page 14 of 22 using Betadine. -In reviewing things with the patient he has what sounds like claudication with mild to moderate amount of activity. 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 I prescribed Levaquin over the phone while we attempted to arrange a plan of care. However the patient called yesterday to report he had low-grade fever and he came in today acutely. There is been a marked deterioration in the left third toe with spreading cellulitis up into the dorsal left foot. He was referred to the emergency room. Readmission: 06/29/2020 patient presents today for reevaluation here in our clinic he was previously treated by Dr. Rufus at the latter part of 2019 in 2 the beginning of 2020. Subsequently we have not seen him since that time in the interim he did have evaluation with vein and vascular specialist specifically Dr. Penne Angus who did perform quite extensive work for a left femoral to anterior tibial artery bypass. With that being said in the interim the patient has developed significant lymphedema and has wounds that he tells me have really never healed in regard to the incision site on the left leg. He also has multiple wounds on the feet for various reasons some of which is that he tends to pick at his feet. Fortunately there is no signs of active infection systemically at this time he does have some wounds that are little bit deeper but most are fairly superficial he seems to have good blood flow and overall everything appears to be healthy I see no bone exposed and no obvious signs of osteomyelitis. I do not know that he necessarily needs a x-ray at this point although that something we could consider depending on how things progress. The patient does have a history of lymphedema, diabetes, this is type II, chronic  kidney disease stage  III, hypertension, and history of peripheral vascular disease. 07/05/2020; patient admitted last week. Is a patient I remember from 2019 he had a spreading infection involving the left foot and we sent him to the hospital. 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 He subsequently had a left femoral to anterior tibial bypass by Dr.Cain vein and vascular. He also has severe lymphedema with chronic skin changes related to that on the left leg. The most problematic area that was new today was on the left medial great toe. This was apparently a small area last week there was purulent drainage which our intake nurse cultured. Also areas on the left medial foot and heel left lateral foot. He has 2 areas on the left medial calf left lateral calf in the setting of the severe lymphedema. 07/13/2020 on evaluation today patient appears to be doing better in my opinion compared to his last visit. The good news is there is no signs of active infection systemically and locally I do not see any signs of infection either. He did have an x-ray which was negative that is great news he had a culture which showed MRSA but at the same time he is been on the doxycycline  which has helped. I do think we may want to extend this for 7 additional days 1/25; patient admitted to the clinic a few weeks ago. 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 We have been putting him under compression his edema control is a lot better but he is severe verricused skin on the left leg. He is really done quite well he still has an open area on the left medial calf and the left medial first metatarsal head. We have been using silver collagen on the leg silver alginate on the foot 07/27/2020 upon evaluation today patient appears to be doing decently well in regard to his wounds. He still has a lot of dry skin on the left leg. Some of this  is starting to peel back and I think he may be able to have them out by removing some that today. Fortunately there is no signs of active infection at this time on the left leg although on the right leg he does appear to have swelling and erythema as well as some mild warmth to touch. This does have been concerned about the possibility of cellulitis although within the differential diagnosis I do think that potentially a DVT has to be at least considered. We need to rule that out before proceeding would just call in the cellulitis. Especially since he is having pain in the posterior aspect of his calf muscle. 2/8; the patient had seen sparingly. He has severe skin changes of chronic lymphedema in the left leg thickened hyperkeratotic verrucous skin. He has an open wound on the medial part of the left first met head left mid tibia. He also has a rim of nonepithelialized skin in the anterior mid tibia. He brought in the AmLactin lotion that was been prescribed although I am not sure under compression and its utility. There concern about cellulitis on the right lower leg the last time he was here. He was put on on antibiotics. His DVT rule out was negative. The right leg looks fine he is using his stocking on this area 08/10/2020 upon evaluation today patient appears to be doing well with regard to his leg currently. He has been tolerating the dressing changes  without complication. Fortunately there is no signs of active infection which is great news. Overall very pleased with where things stand. 2/22; the patient still has an area on the medial part of the left first met his head. This looks better than when I last saw this earlier this month he has a rim of epithelialization but still some surface debris. Mostly everything on the left leg is healed. There is still a vulnerable in the left mid tibia area. 08/30/2020 upon evaluation today patient appears to be doing much better in regard to his wounds on his  foot. 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 Nonetheless I think we will address that today I Minna send in a prescription for him in that regard. Overall though there does not appear to be any signs of significant worsening. 09/07/2020 on evaluation today patient's wounds over his left foot appear to be doing excellent. I do not see any signs of infection there is some callus buildup this can require debridement for certain but overall I feel like he is managing quite nicely. He still using the AmLactin cream which has been beneficial for him as well. 3/22; left foot wound is closed. There is no open area here. He is using ammonium lactate  lotion to the lower extremities to help exfoliate dry cracked skin. He has compression stockings from [facility] in [place]. The wound on the medial part of his left first met head is healed today. READMISSION 04/12/2021 Mr. Gatti is a patient we know fairly well he had a prolonged stay in clinic in 2019 with wounds on his left lateral and left anterior lower extremity in the setting of chronic venous insufficiency. More recently he was here earlier this year with predominantly an area on his left foot first metatarsal head plantar and he says the plantar foot broke down on its not long after we discharged him but he did not come back here. The last few months areas of broken down on his left anterior and again the left lateral lower extremity. The leg itself is very swollen chronically enlarged a lot of hyperkeratotic dry Berry Q skin in the left lower leg. His edema extends well into the thigh. He was seen by Dr. Sheree. He had ABIs on 03/02/2021 showing an ABI on the right of 1 with a TBI of 0.72 his ABI in the left at 1.09 TBI of 0.99. Monophasic and biphasic waveforms on the right. On the left monophasic waveforms were  noted he went on to have an angiogram on 03/27/2021 this showed the aortic aortic and iliac segments were free of flow-limiting stenosis the left common femoral vein to evaluate the left femoral to anterior tibial artery bypass was unobstructed the bypass was patent without any areas of stenosis. We discharged the patient in bilateral juxta lite stockings but very clearly that was not sufficient to control the swelling and maintain skin integrity. He is clearly going to need compression pumps. 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 This is fortunate because he is on his feet for long periods of time. 10/27; patient comes in with our intake nurse reporting copious amount of green drainage from the left anterior mid tibia the left dorsal foot and to a lesser extent the left medial mid  tibia. We left the compression wrap on all week for the amount of edema in his left leg is quite a bit better. We use silver alginate as the primary dressing 11/3; edema control is good. Left anterior lower leg left medial lower leg and the plantar first metatarsal head. The left anterior lower leg required debridement. Deep tissue culture I did of this wound showed MRSA I put him on 10 days of doxycycline  which she will start today. We have him in compression wraps. He has a security card and [employer] however he is retiring on November 15. We will need to then get him into a better offloading boot for the left foot perhaps a total contact cast 11/10; edema control is quite good. Left anterior and left medial lower leg wounds in the setting of chronic venous insufficiency and lymphedema. He also has a substantial area over the left plantar first metatarsal head. I treated him for MRSA that we identified on the major wound on the left anterior mid tibia with doxycycline  and gentamicin topically. He has significant hypergranulation on the left plantar foot wound. The  patient is a diabetic but he does not have significant PAD 11/17; edema control is quite good. Left anterior and left medial lower leg wounds look better. The really concerning area remains the area on the left plantar [name], [name] ([id number]) [phone].pdf Page 15 of 22 first metatarsal head. He has a rim of epithelialization. 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 Starting today with a forefoot off loader but . possibly a total contact cast. 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 Arrives with a very significant odor from I think all of the wounds on the left leg and the left foot. Because of this I did not put a total contact cast on him today but will could still consider this. His wife was having cataract surgery which is the reason he missed the appointment 12/6. I saw this man 5 days ago with a swelling below the popliteal fossa. I thought he actually might have a Baker's cyst however the DVT rule out study that we could arrange right away was negative the technician told me this was not a ruptured Baker's cyst. We attempted to get this aspirated by under ultrasound guidance in interventional radiology however all they did was an ultrasound however it shows an extensive fluid collection 62 x 8 x 9.4 in the left thigh and left calf. The patient states he thinks this started 8 days ago or so but he really is not complaining of any pain, fever or systemic symptoms. He has not ha 12/20; after some difficulty I managed to get the patient into see Dr. Sheree. Eventually he was taken into the hospital and had a drain put in the fluid collection below his left knee posteriorly extending into the posterior thigh. He still has the drain in place.  Culture of this showed moderate staff aureus few Morganella and few Klebsiella he is now on doxycycline  and ciprofloxacin  as suggested by infectious disease he is on this for a month. The drain will remain in place until it stops draining 12/29; he comes in today with the 1 wound on his left leg and the area  on the left plantar first met head significantly smaller. Both look healthy. He still has the drain in the left leg. He says he has to change this daily. Follows up with Dr. Sheree on January 11. 06/29/2021; the wounds that I am following on the left leg and left first met head continued to be quite healthy. However the area where his inferior drain is in place had copious amounts of drainage which was green in color. The wound here is larger. Follows up with Dr. Gari of vein and vascular his surgeon next week as well as infectious disease. He remains on ciprofloxacin  and doxycycline . He is not complaining of excessive pain in either one of the drain areas 1/12; the patient saw vascular surgery and infectious disease. Vascular surgery has left the drain in place as there was still some notable drainage still see him back in 2 weeks. Dr. Mackie stop the doxycycline  and ciprofloxacin  and I do not believe he follows up with them at this point. Culture I did last week showed both doxycycline  resistant MRSA and Pseudomonas not sensitive to ciprofloxacin  although only in rare titers 1/19; the patient's wound on the left anterior lower leg is just about healed. We have continued healing of the area that was medially on the left leg. Left first plantar metatarsal head continues to get smaller. The major problem here is his 2 drain sites 1 on the left upper calf and lateral thigh. There is purulent drainage still from the left lateral thigh. I gave him antibiotics last week but we still have recultured. He has the drain in the area I think this is eventually going to have to come out. I suspect there will be  a connecting wound to heal here perhaps with improved VAc 1/26; the patient had his drain removed by vein and vascular on 1/25/. This was a large pocket of fluid in his left thigh that seem to tunnel into his left upper calf. 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 His mention his Penrose drain was removed today. He now has a tunneling wound on his left calf and left thigh. Both of these probe widely towards each other although I cannot really prove that they connect. Both wounds on his lower leg anteriorly are closed and his area over the first metatarsal head on his right foot continues to improve. We are using Hydrofera Blue here. He also saw infectious disease culture of the abscess they noted was polymicrobial with MRSA, Morganella and Klebsiella he was treated with doxycycline  and ciprofloxacin  for 4 weeks ending on 07/03/2021. They did not recommend any further antibiotics. Notable that while he still had the Penrose drain in place last week he had purulent drainage coming out of the inferior IandD site this grew [facility], MRSA and Pseudomonas but there does not appear to be any active infection in this area today with the drain out and he is not systemically unwell 2/2; with regards to the drain sites the superior one on the thigh actually is closed down the one on the upper left lateral calf measures about 8 and half centimeters which is an improvement seems to be less prominent although still with a lot of drainage. The only remaining wound is over the first metatarsal head on the left foot and this looks to be continuing to improve with Hydrofera Blue. 2/9; the area on his plantar left foot continues to contract. Callus around the wound edge. The drain sites specifically have  not come down in depth. We put the wound VAC on Monday he changed the canister late last night our intake nurse reported a pocket of fluid perhaps caused by our compression wraps 2/16;  continued improvement in left foot plantar wound. drainage site in the calf is not improved in terms of depth (wound vac) 2/23; continued improvement in the left foot wound over the first metatarsal head. With regards to the drain sites the area on his thigh laterally is healed however the open area on his calf is small in terms of circumference by still probes in by about 15 cm. Within using the wound VAC. Hydrofera Blue on his foot 08/24/2021: The left first metatarsal head wound continues to improve. The wound bed is healthy with just some surrounding callus. Unfortunately the open drain site on his calf remains open and tunnels at least 15 cm (the extent of a Q-tip). This is despite several weeks of wound VAC treatment. Based on reading back through the notes, there has been really no significant change in the depth of the wound, although the orifice is smaller and the more cranial wound on his thigh has closed. I suspect the tunnel tracks nearly all the way to this location. 08/31/2021: Continued improvement in the left first metatarsal head wound. There has been absolutely no improvement to the long tunnel from his open drain site on his calf. We have tried to get him into see vascular surgery sooner to consider the possibility of simply filleting the tract open and allowing it to heal from the bottom up, likely with a wound VAC. They have not yet scheduled a sooner appointment than his current mid April 09/14/2021: He was seen by vascular surgery and they took him to the operating room last week. They opened a portion of the tunnel, but did not extend the entire length of the known open subcutaneous tract. I read Dr. Claretta operative note and it is not clear from that documentation why only a portion of the tract was opened. The heaped up granulation tissue was curetted and removed from at least some portion of the tract. They did place a wound VAC and applied an Unna boot to the leg. The ulcer on  his left first metatarsal head is smaller today. The bed looks good and there is just a small amount of surrounding callus. 09/21/2021: The ulcer on his left first metatarsal head looks to be stalled. There is some callus surrounding the wound but the wound bed itself does not appear particularly dynamic. The tunnel tract on his lateral left leg seems to be roughly the same length or perhaps slightly smaller but the wound bed appears healthy with good granulation tissue. He opened up a new wound on his medial thigh and the site of a prior surgical incision. He says that he did this unconsciously in his sleep by scratching. 09/28/2021: Unfortunately, the ulcer on his left first metatarsal head has extended underneath the callus toward the dorsum of his foot. The medial thigh wounds are roughly the same. The tunnel on his lateral left leg continues to be problematic; it is longer than we are able to actually probe with a Q-tip. I am still not certain as to why Dr. Sheree did not open this up entirely when he took the patient to the operating room. We will likely be back in the same situation with just a small superficial opening in a long unhealed tract, as the open portion is granulating in nicely. 10/02/2021:  The patient was initially scheduled for a nurse visit, but we are also applying a total contact cast today. The plantar foot wound looks clean without significant accumulated callus. We have been applying Prisma silver collagen to the site. 10/05/2021: The patient is here for his first total contact cast change. We have tried using gauze packing strips in the tunnel on his lateral leg wound, but this does not seem to be working any better than the white VAC foam. The foot ulcer looks about the same with minimal periwound callus. Medial thigh wound is clean with just some overlying eschar. 10/12/2021: The plantar foot wound is stable without any significant accumulation of periwound callus. The surface is  viable with good granulation tissue. The [name], [name] ([id number]) [id number] Page 16 of 22 medial thigh wounds are much smaller and are epithelializing. On the other hand, he had purulent drainage coming from the tunnel on his lateral leg. He does go back to see Dr. Sheree next week and is planning to ask him why the wound tunnel was not completely opened at the time of his most recent operation. 10/19/2021: The plantar foot wound is markedly improved and has epithelial tissue coming through the surface. The medial thigh wounds are nearly closed with just a tiny open area. He did see Dr. Sheree earlier this week and apparently they did discuss the possibility of opening the sinus tract further and enabling a wound VAC application. Apparently there are some limits as to what Dr. 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 I think if we could get the tract open to the level of the popliteal fossa, this would greatly aid in her ability to get this chart closed. That being said, however, today when I probed the tract with a Q-tip, I was not able to insert the entirety of the Q-tip as I have on previous occasions. The tunnel is shorter by about 4 cm. The surface is clean with good granulation tissue and no further episodes of purulent drainage. 10/30/2021: Last week, the patient underwent surgery and had the long tract in his leg opened. There was a rind that was debrided, according to the operative report. His medial thigh ulcers are closed. The plantar foot wound is clean with a good surface and some built up surrounding callus. 11/06/2021: The overall dimensions of the large wound on his lateral leg remain about the same, but there is good granulation tissue present and the tunneling is a little bit shorter. He has a new wound on his anterior tibial surface, in the same location where he had a similar lesion in the past. The plantar foot wound  is clean with some buildup surrounding callus. Just toward the medial aspect of his foot, however, there is an area of darkening that once debrided, revealed another opening in the skin surface. 11/13/2021: The anterior tibial surface wound is closed. The plantar foot wound has some surrounding callus buildup. The area of darkening that I debrided last week and revealed an opening in the skin surface has closed again. The tunnel in the large wound on his lateral leg has come in by about 3 cm. There is healthy granulation tissue on the entire wound surface. 11/23/2021: The patient was out of town last week and did wet-to-dry dressings on his large wound. 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 Unfortunately, he picked open the wound on his left medial  thigh. He says that it was itching and he just could not stop scratching it until it was open again. The wound on his plantar foot is smaller and has not accumulated a tremendous amount of callus. The lateral leg wound is shallower and the tunnel has also decreased in depth. There is just a little bit of slough accumulation on the surface. 11/30/2021: Another portion of his left medial thigh has been opened up. All of these wounds are fairly superficial with just a little bit of slough and eschar accumulation. The wound on his plantar foot is almost closed with just a bit of eschar and periwound callus accumulation. The lateral leg wound is nearly flush with the surrounding skin and the tunnel is markedly shallower. 12/07/2021: There is just 1 open area on his left medial thigh. It is clean with just a little bit of perimeter eschar. The wound on his plantar foot continues to contract and just has some eschar and periwound callus accumulation. The lateral leg wound is closing at the more distal aspect and the tunnel is smaller. The surface is nearly flush with the surrounding skin and it has  a good bed of granulation tissue. 12/14/2021: The thigh and foot wounds are closed. The lateral leg wound has closed over approximately half of its length. The tunnel continues to contract and the surface is now flush with the surrounding skin. The wound bed has robust granulation tissue. 12/22/2021: The thigh and foot wounds have reopened. The foot wound has a lot of callus accumulation around and over it. The thigh wound is tiny with just a little bit of slough in the wound bed. The lateral leg wound continues to contract. His vascular surgeon took the wound VAC off earlier in the week and the patient has been doing wet-to-dry dressings. There is a little slough accumulation on the surface. The tunnel is about 3 cm in depth at this point. 12/28/2021: The thigh wound is closed again. The foot wound has some callus that subsequently has peeled back exposing just a small slit of a wound. The lateral leg wound Is down to about half the size that it originally was and the tunnel is down to about half a centimeter in depth. 01/04/2022: The thigh wound remains closed. The foot wound has heavy callus overlying the wound site. Once this was debrided, the wound was found to be closed. The lateral leg wound is smaller again this week and very superficial. No tunnel could be identified. 01/12/2022: The thigh and foot wounds both remain closed. The lateral leg wound is now nearly flush with the skin surface. There is good granulation tissue present with a light layer of slough. 01/19/2022: Due to the way his wrap was placed, the patient did not change the dressing on his thigh at all and so the foam was saturated and his skin is macerated. There is a light layer of slough on the wound surface. The underlying granulation tissue is robust and healthy-appearing. He has heavy callus buildup at the site of his first metatarsal head wound which is still healed. 02/01/2022: He has been in silver alginate. When he removed the  dressing from his thigh wound, however, some leg, superficially reopening a portion of the wound that had healed. In addition, underneath the callus at his left first metatarsal head, there appears to be a blister and the wound appears to be open again. 02/08/2022: The lateral leg wound has contracted substantially. There is eschar and a light layer of  slough present. He says that it is starting to pull and is uncomfortable. On inspection, there is some puckering of the scar and the eschar is quite dry; this may account for his symptoms. On his first metatarsal head, the wound is much smaller with just some eschar on the surface. The callus has not reaccumulated. He reports that he had a blister come up on his medial thigh wound at the distal aspect. It popped and there is now an opening in his skin again. Looking back through his library of wound photos, there is what looks like a permanent suture just deep to this location and it may be trying to erode through. We have been using silver alginate on his wounds. 02/15/2022: The lateral leg wound is about half the size it was last week. It is clean with just a little perimeter eschar and light slough. The wound on his first metatarsal head is about the same with heavy callus overlying it. The medial thigh wound is closed again. He does have some skin changes on the top of his foot that looks potentially yeast related. 02/22/2022: The skin on the top of his foot improved with the use of a topical antifungal. The lateral leg wound continues to contract and is again smaller this week. There is a little bit of slough and eschar on the surface. The first metatarsal head wound is a little bit smaller but has reaccumulated a thick callus over the top. He decided to try to trim his toenail and ultimately took the entire nail off of his left great toe. 03/02/2022: His lateral leg wound continues to improve, as does the wound on his left great toe. Unfortunately, it  appears that somehow his foot got wet and moisture seeped in through the opening causing his skin to lift. There is a large wound now overlying his first metatarsal on both the plantar, medial, and dorsal portion of his foot. There is necrotic tissue and slough present underneath the shaggy macerated skin. 03/08/2022: The lateral leg wound is smaller again today. There is just a light layer of slough and eschar on the surface. The great toe wound is smaller again today. The first metatarsal wound is a little bit smaller today and does not look nearly as necrotic and macerated. There is still slough and nonviable tissue present. 03/15/2022: The lateral leg wound is narrower and just has a little bit of light slough buildup. The first metatarsal wound still has a fair amount of moisture affecting the periwound skin. The great toe wound is healed. 03/22/2022: The lateral leg wound is now isolated to just at the level of his knee. There is some eschar and slough accumulation. The first metatarsal head wound has epithelialized tremendously and is about half the size that it was last week. He still has some maceration on the top of his foot and a fungal odor is present. 03/29/2022: T oday the patient's foot was macerated, suggesting that the cast got wet. The patient has also been picking at his dry skin and has enlarged the wound on his left lateral leg. In the time between having his cast removed and my evaluation, he had picked more dry skin and opened up additional wounds on his Achilles area and dorsal foot. The plantar first metatarsal head wound, however, is smaller and clean with just macerated callus around the perimeter and light slough on the surface. The lateral leg wound measured a little bit larger but is also fairly clean with eschar  and minimal slough. TAIQUAN, CAMPANARO ([id number]) [id number] Page 17 of 22 04/02/2022: The patient had vascular studies done last Friday  and so his cast was not applied. He is here today to have that done. 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 04/05/2022: Both wounds are smaller and quite clean. There is just a little biofilm on the lateral leg wound. 10/20; the patient has a wound on the left lateral surgical incision at the level of his lateral knee this looks clean and improved. He is using silver alginate. He also has an area on his left medial foot for which she is using Hydrofera Blue under a total contact cast both wounds are measuring smaller 04/20/2022: The plantar foot wound has contracted considerably and is very close to closing. The lateral leg wound was measured a little larger, but there was a tiny open area that was included in the measurements that was not included last week. He has some eschar around the perimeter but otherwise the wound looks clean. 04/27/2022: The lateral leg wound looks better this week. He says that midweek, he felt it was very dry and began applying hydrogel to the site. I think this was beneficial. The foot wound is nearly closed underneath a thick layer of dry skin and callus. 05/04/2022: The foot wound is healed. He has developed a new small ulcer on his anterior tibial surface about midway up his leg. It has a little slough on the surface. The lateral leg wound still is fairly dry, but clean with just a little biofilm on the surface. 05/11/2022: The wound on his foot reopened on Wednesday. A large blister formed which then broke open revealing the fat layer underneath. The ulcer on his anterior tibial surface is a little bit larger this week. The lateral leg wound has much better moisture balance this week. Fortunately, prior to his foot wound reopening, he did get the cast made for his orthotic. 05/15/2022: Already, the left medial foot wound has improved. The tissue is less macerated and the surface is clean. The ulcer on his anterior tibial surface continues to enlarge.  This seems likely secondary to accumulated moisture. The lateral leg wound continues to have an improved moisture balance with the use of collagen. 05/25/2022: The medial foot wound continues to contract. It is now substantially smaller with just a little slough on the surface. The anterior tibial surface wound continues to enlarge further. Once again, this seems to be secondary to moisture. The lateral leg wound does not seem to be changing much in size, but the moisture balance is better. 06/01/2022: The anterior tibial wound is closed. The medial foot wound is down to just a very small, couple of millimeters, opening. The lateral leg wound has good moisture balance, but remains unchanged in size. 12/15; the patient's anterior tibial wound has reopened, however the area on his right first metatarsal head is closed. The major wound is actually on the superior part of his surgical wound in the left lateral thigh. Not a completely viable surface under illumination. This may at some point require a debridement I think he is currently using Prisma. As noted the left medial foot wound has closed 06/14/2022: The anterior tibial wound has closed. The lateral leg wound has a better surface but is basically unchanged in size. The left medial foot wound has reopened. It looks as though there was some callus accumulation and moisture got under the callus which caused the tissue to break down again. 06/21/2022:  A new wound has opened up just distal to the previous anterior tibial wound. It is small but has hypertrophic granulation tissue present. The lateral leg wound is a little bit narrower and has a layer of slough on the surface. The left medial foot wound is down to just a pinhole. His custom orthotics should be available next week. 06/28/2022: The wound on his first metatarsal head has healed. He has developed a new small wound on his medial lower leg, in an old scar site. The lateral leg wound continues to  contract but continues to accumulate slough, as well. 07/03/2022: Despite wearing his custom orthopedic shoes, he managed to reopen the wound on his first metatarsal head. He says he thinks his foot got wet and then some skin lifted up and he peeled this away. Both of the lower leg wounds are smaller and have some dry eschar on the surface. The lateral leg wound is quite a bit narrower today. 07/12/2022: The medial lower leg wound is closed. The anterior lower leg wound has contracted considerably. The lateral upper leg wound is narrower with a layer of slough on the surface. The first metatarsal head wound is also smaller, but had copious drainage which saturated the foam border dressing and resulted in some periwound tissue maceration. Fortunately there was no breakdown at this site. 07/19/2022: The lower leg shows signs of significant maceration; I think he must be sweating excessively inside his cast. There are several areas of skin breakdown present. The wound on his foot is smaller and that on his lateral leg is narrower and is shorter by about a centimeter. 07/26/2022: Last week we used a zinc Coflex wrap prior to applying his total contact cast and this has had the effect of keeping his skin from getting macerated this week. The anterior leg wound has epithelialized substantially. The lateral leg wound is significantly smaller with just a bit of slough on the surface. The first metatarsal head wound is also smaller this week. 08/02/2022: The anterior leg wound was closed on arrival, but while he was sitting in the room, he picked it open again. The lateral leg wound is smaller with just a little slough on the surface and the first metatarsal head wound has contracted further, as well. 08/09/2022: The first metatarsal head wound is covered with callus. Underneath the callus, it is nearly completely closed. The lateral leg wound is smaller again this week. The anterior leg wound looks better, but he has  such heavy buildup of old skin, that moisture is getting underneath this, becoming trapped, and causing the underlying skin to get macerated and open up. 08/16/2022: The first metatarsal head wound is closed. The lateral leg wound continues to contract and is quite a bit smaller again this week. There is just a small, superficial opening remaining on his anterior tibial surface. 08/23/2022: The first metatarsal head wound has, by some miracle, remained closed. The lateral leg wound is substantially smaller with multiple areas of epithelialization. The anterior tibial surface wound is also quite a bit smaller and very clean. 08/30/2022: Unfortunately, his first metatarsal head wound opened up again. It happened in the same fashion as it has on prior occasions. Moisture got under dried skin/callus and created a wound when he removed his sock, taking the skin with it. The anterior tibial surface has a thick shell of hyperkeratotic skin. This has been contributing to ongoing repeat wounding events as moisture gets underneath this and causes tissue breakdown. 3/15; patient presents for  follow-up. His anterior left leg wound has healed. He still has the wound to the left lateral aspect and left first met head. We have been using silver alginate and endoform to these areas under Foot locker. He has no issues or complaints today. He has been taking Augmentin  and reports improvement to his symptoms to the left first met head. 09/13/2022: He has accumulated more thick dry skin in sheets on his lower leg. The lateral leg wound is about the same size and the left first metatarsal head wound is a little bit smaller. There is slough on both surfaces. There is callus buildup around the foot wound. 09/20/2022: The lateral leg wound is a little bit narrower and the left first metatarsal head wound also seems to have contracted slightly. There is slough on both surfaces. He has a little skin breakdown on his anterior tibial  surface. 09/27/2022: The lateral leg wound continues to contract and is quite clean. The first metatarsal head wound is also smaller. There is some perimeter callus and slough accumulation on the foot. The anterior tibial surface is closed. ZANIEL, MARINEAU ([id number]) [id number] Page 18 of 22 10/04/2022: Both of his wounds are smaller today, particularly the first metatarsal head wound. 10/18/2022: He missed his appointment last week and ended up cutting off his wrap on Saturday. The anterior tibial wound reopened. It is fairly superficial with a little bit of slough on the surface. His lateral leg wound is smaller with some slough and eschar buildup. The first metatarsal head wound is also smaller with some callus and slough accumulation. 10/25/2022: All wounds are smaller. There is slough and eschar on the lateral leg and slough and callus on the plantar foot wound. The anterior tibial wound is clean and flush with the surrounding skin. No debris accumulation here. 11/01/2022: The wounds are all smaller again this week. There is slough on the lateral leg and some minimal slough and eschar on the anterior tibial wound. There is callus accumulation on the first metatarsal head site, along with slough. There is also a yeasty odor coming from the foot. 11/08/2022: The lateral leg wound is smaller except where he picked some skin while waiting to be seen. There is a little bit of slough on the surface. The anterior tibial wound is closed. The first metatarsal head site has gotten macerated once again and has a lot of spongy wet tissue and callus around it. No yeast odor today. 11/15/2022: The lateral leg wound continues to contract. There is now a band of epithelium dividing it into 2 areas. Minimal slough accumulation. He managed to pick open a new wound on his medial lower leg. The first metatarsal head site is smaller with some callus accumulation but no tissue  maceration. 11/22/2022: The lateral leg wound is smaller again by about a third today. There is a little bit of slough on the surface with some periwound eschar. The new wound that he picked open last week has healed but he picked open 3 new small wounds on his anterior tibial surface, once again due to his picking at his skin. The first metatarsal head wound looks about the same. There has been some callus accumulation and there is more edema present, as he was not put in a compression wrap last week. 11/29/2022: The lateral leg wound is smaller again today. There is a little periwound eschar and some slough present. The wounds on his anterior tibial surface are all closed except for 1 that has a little  bit of slough on the open portion with eschar covering it. The first metatarsal head wound measured a little bit smaller today, but mostly looks about the same. Edema control is better. 12/06/2022: The anterior tibial wound is closed. The lateral leg wound is smaller with some slough and eschar accumulation. The plantar foot ulcer is about the same size, but the tissue does show some evidence of the fact that he was on his feet quite a bit more this past week; there was a death in his family. There has been more callus accumulation and the tissues are little bit more purpleish. 12/13/2022: He picked the skin on his anterior tibia and reopened a wound there while he was waiting to be seen in clinic. The lateral leg wound is much smaller with a little bit of slough and eschar. The plantar foot ulcer is smaller today with callus and slough buildup, but without the pressure induced tissue injury that was seen last week. 12/20/2022: The anterior tibial wound has healed. The lateral leg wound is smaller again this week. There is a little bit of eschar buildup on the surface. The foot had a fairly strong odor coming from it that persisted even after washing. The wound itself looks like its gotten a little smaller  but he has built up thick callus, once again. 12/26/2022: The lateral leg wound is down to just a narrow superficial slit. There is slough and a little bit of dry skin present. The foot is in much better shape today. There is less callus accumulation and no odor. The skin edges are starting to roll inward, however. 01/03/2023: The lateral leg wound is healed. The skin is quite dry but the wound has closed. The foot continues to contract. He has a fair amount of periwound callus accumulation and the surface of the is a little drier than ideal. 01/10/2023: The large lateral leg wound remains closed. He managed to pick off some of his dry skin and has multiple small open superficial wounds on his lower leg. The foot wound measures smaller, but he has a blister immediately adjacent to it. 01/17/2023: The large lateral leg wound remains closed. The small superficial wounds on his lower leg have also closed. The foot wound is about the same and the blister area immediately adjacent to it has dark eschar on the surface. The foot wound looks a bit dry. 01/24/2023: He picked at some dry skin on his lateral leg wound and reopened. The surface is dry and fibrotic. The foot wound is slightly smaller but has periwound callus and the edges are rolling inward again. 01/31/2023: His lateral leg wound is larger again today. It appears that the Ace bandage may be rubbing and causing some tissue breakdown. His foot wound is also a bit larger and the tissue does not appear particularly healthy. 02/07/2023: The lateral leg wound improved quite significantly over the last week. He has been applying additional gauze over the site before applying the Ace bandage and this seems to have minimized the rubbing. The foot wound is about the same. The culture that I took last week returned with a polymicrobial population of predominantly skin flora. He is currently taking Augmentin  as recommended by the culture data. 02/14/2023: The lateral  leg wound is covered in a layer of eschar. Underneath, the wound is nearly healed again. The foot wound looks better today. The depth around the edges has decreased. The tissue has a healthier appearance. He is still taking Augmentin . 02/21/2023: The lateral  leg wound is smaller with a layer of eschar on the surface, but is not quite healed. The foot wound is stable but the depth around the edges is almost completely obliterated. There is some callus and senescent skin around the edges. 03/07/2023: The lateral leg wound is down to just a pinhole with a little bit of eschar on the surface. The foot wound demonstrates evidence that he has been walking excessively the past week and there is more callus with moisture underneath it, but the breakdown is limited to the callus layer and there is still intact skin underneath. 03/14/2023: The lateral leg wound is closed. The foot wound looks much improved this week. There is very minimal callus accumulation around the wound edges and the tissue surface appears healthier. It seems the adjustments made to his shoe by the [facility] were effective. 03/28/2023: The lateral leg wound has reopened. The patient reports that he was not moisturizing it as much as he probably should have been and it cracked and some skin peeled away. The foot wound shows signs of pressure-related trauma with some bruising and increased callus. The patient admits to extensive walking this past week while taking his wife to medical appointments. He does have a knee scooter and it is unclear why he does not use it. 04/04/2023: The lateral leg wound is larger again today. The patient reports that he thinks maybe some dry skin pulled off when he was changing his dressing. It is quite dry and he states that he is using just water to moisten his endoform, rather than the hydrogel that was recommended. The foot wound is the same size, but the tissue is in better condition, without substantial  bruising or friction-related trauma. 04/11/2023: Both wounds look better this week. Much of the lateral leg wound has epithelialized. The moisture balance is much better. The foot wound looks better than it has in months. The granulation tissue is flush with the surrounding skin and there is not much callus accumulation. 04/25/2023: The patient was out of town last week. The lateral leg ulcer has good moisture balance, but has opened up a little bit more at the most proximal aspect. The foot ulcer has a layer of callus around the outside but is otherwise fairly clean with minimal slough buildup. 05/02/2023: He has 2 new wounds today. He has picked at the skin on his left anterior tibial surface and opened up the wound location. He picked a callus on his medial foot adjacent to his existing metatarsal head wound and opened up the site there. The lateral leg wound looks a little bit better this week and the moisture [name], [name] ([id number]) [id number] Page 19 of 22 balance is excellent. The metatarsal head wound has a little bit of breakdown at the more proximal end but otherwise is also making improvement. 05/09/2023: Both foot ulcers are smaller and look better today. The lower leg ulcer is also smaller. The lateral leg ulcer is basically the same size. All of the wounds have slough on the surface and there is some callus accumulation around the plantar first metatarsal head wound. 05/16/2023: The smaller of the foot ulcers is nearly healed. The plantar foot ulcer is stable. The lower leg ulcer is smaller. The lateral leg ulcer is a little bit smaller and the moisture balance is good. There is slough on all of the wound surfaces. 06/13/2023: The small, more dorsal foot wounds have epithelialized. The plantar foot ulcer is stable in size, but the tissue  appears healthier. The lower leg ulcer is about the same size. The lateral leg ulcer is larger today. He reports that he  had it nearly healed and the skin peeled off again as it got dry. 06/20/2023: The smaller more dorsal foot wounds are open again, but they are tiny. The plantar foot ulcer is stable, but the surface is looking a little bit dry. The lower leg ulcer is unchanged. The lateral leg ulcer is a little bit smaller today, but he has an area just distal to the wound where he says some tape pulled off, resulting in a wound. No concern for infection at any of the locations. 06/27/2023: The small dorsal foot wounds are closed again. The plantar foot ulcer is a little bit bigger today with more callus around the edges and senescent skin at the margins. It is clean without any significant odor. The anterior tibial wound is measuring slightly larger today, but is otherwise unchanged. The thigh ulcer measured slightly smaller today. There is dry skin around the edges and slough on the surface. Objective Constitutional Hypertensive, asymptomatic. no acute distress. Vitals Time Taken: 7:58 AM, Height: 74 in, Weight: 238 lbs, BMI: 30.6, Temperature: 98.4 F, Pulse: 64 bpm, Respiratory Rate: 16 breaths/min, Blood Pressure: 152/85 mmHg. Respiratory Normal work of breathing on room air.. General Notes: 06/27/2023: The small dorsal foot wounds are closed again. The plantar foot ulcer is a little bit bigger today with more callus around the edges and senescent skin at the margins. It is clean without any significant odor. The anterior tibial wound is measuring slightly larger today, but is otherwise unchanged. The thigh ulcer measured slightly smaller today. There is dry skin around the edges and slough on the surface. Integumentary (Hair, Skin) Wound #18R status is Open. Original cause of wound was Gradually Appeared. The date acquired was: 08/23/2020. The wound has been in treatment 115 weeks. The wound is located on the Left,Plantar Metatarsal head first. The wound measures 2cm length x 2.5cm width x 0.2cm depth; 3.927cm^2  area and 0.785cm^3 volume. There is Fat Layer (Subcutaneous Tissue) exposed. There is undermining starting at 6:00 and ending at 9:00 with a maximum distance of 0.3cm. There is a medium amount of serosanguineous drainage noted. The wound margin is epibole. There is large (67-100%) red granulation within the wound bed. There is a small (1-33%) amount of necrotic tissue within the wound bed including Adherent Slough. The periwound skin appearance had no abnormalities noted for moisture. The periwound skin appearance had no abnormalities noted for color. The periwound skin appearance exhibited: Callus. Periwound temperature was noted as No Abnormality. Wound #22R status is Open. Original cause of wound was Bump. The date acquired was: 06/03/2021. The wound has been in treatment 107 weeks. The wound is located on the Left,Proximal,Lateral Lower Leg. The wound measures 1.3cm length x 0.7cm width x 0.1cm depth; 0.715cm^2 area and 0.071cm^3 volume. There is Fat Layer (Subcutaneous Tissue) exposed. There is no tunneling or undermining noted. There is a medium amount of serous drainage noted. The wound margin is distinct with the outline attached to the wound base. There is large (67-100%) red granulation within the wound bed. There is a small (1-33%) amount of necrotic tissue within the wound bed including Eschar and Adherent Slough. The periwound skin appearance had no abnormalities noted for color. The periwound skin appearance exhibited: Scarring, Dry/Scaly. The periwound skin appearance did not exhibit: Maceration. Periwound temperature was noted as No Abnormality. Wound #36 status is Open. Original cause of  wound was Skin T ear/Laceration. The date acquired was: 05/02/2023. The wound has been in treatment 8 weeks. The wound is located on the Left,Anterior Lower Leg. The wound measures 1.1cm length x 0.6cm width x 0.2cm depth; 0.518cm^2 area and 0.104cm^3 volume. There is Fat Layer (Subcutaneous Tissue)  exposed. There is no tunneling or undermining noted. There is a medium amount of serous drainage noted. The wound margin is distinct with the outline attached to the wound base. There is medium (34-66%) red, pink granulation within the wound bed. There is a medium (34-66%) amount of necrotic tissue within the wound bed including Adherent Slough. The periwound skin appearance had no abnormalities noted for texture. The periwound skin appearance had no abnormalities noted for moisture. The periwound skin appearance exhibited: Hemosiderin Staining. Periwound temperature was noted as No Abnormality. 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 There was a Excisional Skin/Subcutaneous Tissue Debridement with a total area of 5.89 sq cm performed by Marolyn Nest, MD. With the following instrument(s): Curette to remove Viable and Non-Viable tissue/material. Material removed includes Callus, Subcutaneous Tissue, and Slough after achieving pain control using Lidocaine  4% Topical Solution. No specimens were taken. A time out was conducted at 08:15, prior to the start of the procedure. A Minimum amount of bleeding was controlled with Pressure. The  procedure was tolerated well. Post Debridement Measurements: 2cm length x 2.5cm width x 0.2cm depth; 0.785cm^3 volume. Character of Wound/Ulcer Post Debridement is improved. Severity of Tissue Post Debridement is: Fat layer exposed. Post procedure Diagnosis Wound #18R: Same as Pre-Procedure Wound #22R Pre-procedure diagnosis of Wound #22R is a Cyst located on the Left,Proximal,Lateral Lower Leg . There was a Excisional Skin/Subcutaneous Tissue Debridement with a total area of 0.71 sq cm performed by Marolyn Nest, MD. With the following instrument(s): Curette to remove Viable and Non-Viable tissue/material. Material removed includes Subcutaneous Tissue, Slough, Skin: Dermis, and Skin: Epidermis after achieving pain control using Lidocaine  4% T opical Solution. No specimens were taken. A time out was conducted at 08:15, prior to the start of the procedure. A Minimum amount of bleeding was controlled with Pressure. The procedure was tolerated well. Post Debridement Measurements: 1.3cm length x 0.7cm width x 0.1cm depth; 0.071cm^3 volume. Character of Wound/Ulcer Post Debridement is improved. Post procedure Diagnosis Wound #22R: Same as Pre-Procedure Wound #36 Pre-procedure diagnosis of Wound #36 is a Diabetic Wound/Ulcer of the Lower Extremity located on the Left,Anterior Lower Leg .Severity of Tissue Pre Debridement is: Fat layer exposed. There was a Excisional Skin/Subcutaneous Tissue Debridement with a total area of 0.52 sq cm performed by Marolyn Nest, MD. With the following instrument(s): Curette to remove Viable and Non-Viable tissue/material. Material removed includes Subcutaneous Tissue and Slough and after achieving pain control using Lidocaine  4% T opical Solution. No specimens were taken. A time out was conducted at 08:15, prior to the start of the procedure. A Minimum amount of bleeding was  controlled with Pressure. The procedure was tolerated well. Post Debridement Measurements:  1.1cm length x 0.6cm width x 0.2cm depth; 0.104cm^3 volume. Character of Wound/Ulcer Post Debridement is improved. Severity of Tissue Post Debridement is: Fat layer exposed. Post procedure Diagnosis Wound #36: Same as Pre-Procedure Plan Follow-up Appointments: Return Appointment in 1 week. - Dr. Marolyn 07/04/23 at 10:00am Anesthetic: (In clinic) Topical Lidocaine  4% applied to wound bed Bathing/ Shower/ Hygiene: May shower and wash wound with soap and water. Edema Control - Orders / Instructions: Elevate legs to the level of the heart or above for 30 minutes daily and/or when sitting for 3-4 times a day throughout the day. Avoid standing for long periods of time. Patient to wear own compression stockings every day. Moisturize legs daily. Off-Loading: Wound #18R Left,Plantar Metatarsal head first: Other: - custom diabetic shoes Additional Orders / Instructions: Follow Nutritious Diet - vitamin C 500 mg 3 times a day and zinc 30-50 mg a day WOUND #18R: - Metatarsal head first Wound Laterality: Plantar, Left Cleanser: Normal Saline 1 x Per Day/30 Days Discharge Instructions: Cleanse the wound with Normal Saline prior to applying a clean dressing using gauze sponges, not tissue or cotton balls. Cleanser: Soap and Water 1 x Per Day/30 Days Discharge Instructions: May shower and wash wound with dial  antibacterial soap and water prior to dressing change. Cleanser: Wound Cleanser 1 x Per Day/30 Days Discharge Instructions: Cleanse the wound with wound cleanser prior to applying a clean dressing using gauze sponges, not tissue or cotton balls. Topical: Skintegrity Hydrogel 4 (oz) 1 x Per Day/30 Days Discharge Instructions: Apply hydrogel as directed Prim Dressing: Promogran Prisma Matrix, 4.34 (sq in) (silver collagen) 1 x Per Day/30 Days ary Discharge Instructions: Moisten collagen with saline or hydrogel Secondary Dressing: Optifoam Non-Adhesive Dressing, 4x4 in (Generic) 1 x Per Day/30  Days Discharge Instructions: Apply over primary dressing as directed. Secondary Dressing: Woven Gauze Sponges 2x2 in (Generic) 1 x Per Day/30 Days Discharge Instructions: Apply over primary dressing as directed. Secured With: 66M Medipore H Soft Cloth Surgical T ape, 4 x 10 (in/yd) 1 x Per Day/30 Days Discharge Instructions: Secure with tape as directed. WOUND #22R: - Lower Leg Wound Laterality: Left, Lateral, Proximal Cleanser: Soap and Water 1 x Per Day/30 Days Discharge Instructions: May shower and wash wound with dial  antibacterial soap and water prior to dressing change. Cleanser: Wound Cleanser 1 x Per Day/30 Days Discharge Instructions: Cleanse the wound with wound cleanser prior to applying a clean dressing using gauze sponges, not tissue or cotton balls. Topical: Skintegrity Hydrogel 4 (oz) 1 x Per Day/30 Days Discharge Instructions: Apply hydrogel as directed Prim Dressing: Promogran Prisma Matrix, 4.34 (sq in) (silver collagen) 1 x Per Day/30 Days ary Discharge Instructions: Moisten collagen with saline or hydrogel Secondary Dressing: Woven Gauze Sponge, Non-Sterile 4x4 in 1 x Per Day/30 Days Discharge Instructions: Apply over primary dressing as directed. Secured With: 66M Medipore H Soft Cloth Surgical T ape, 4 x 10 (in/yd) 1 x Per Day/30 Days Discharge Instructions: Secure with tape as directed. WOUND #36: - Lower Leg Wound Laterality: Left, Anterior Cleanser: Soap and Water 1 x Per Day/30 Days Discharge Instructions: May shower and wash wound with dial  antibacterial soap and water prior to dressing change. Cleanser: Wound Cleanser 1 x Per Day/30 Days MAURILIO, [name] ([id number]) [id number].pdf Page 21 of 22 Discharge Instructions: Cleanse the wound with wound cleanser prior to applying a clean dressing using gauze sponges, not tissue or cotton balls. Prim Dressing: Maxorb  Extra Ag+ Alginate Dressing, 2x2 (in/in) 1 x Per Day/30 Days ary Discharge  Instructions: Apply to wound bed as instructed Secondary Dressing: Zetuvit Plus Silicone Border Dressing 4x4 (in/in) 1 x Per Day/30 Days Discharge Instructions: Apply silicone border over primary dressing as directed. 06/27/2023: The small dorsal foot wounds are closed again. The plantar foot ulcer is a little bit bigger today with more callus around the edges and senescent skin at the margins. It is clean without any significant odor. The anterior tibial wound is measuring slightly larger today, but is otherwise unchanged. The thigh ulcer measured slightly smaller today. There is dry skin around the edges and slough on the surface. I used a curette to debride callus, slough, subcutaneous tissue, and skin from the plantar foot ulcer. I debrided slough and subcutaneous tissue from the anterior tibial wound. I debrided dry skin, slough, and subcutaneous tissue from the thigh ulcer. I discussed going back into a total contact cast with the patient as we have been able to heal his foot ulcer in the past using this modality. He is willing to do so but wants to wait until next week. We will continue Prisma silver collagen to the foot and thigh wound and silver alginate to the anterior tibial wound. He is wearing his own compression stockings. Follow-up in 1 week. Electronic Signature(s) Signed: 06/28/2023 1:00:54 PM By: Drury Nestle RN, BSN Signed: 07/01/2023 8:37:20 AM By: Marolyn Nest MD FACS Previous Signature: 06/27/2023 8:34:36 AM Version By: Marolyn Nest MD FACS Previous Signature: 06/27/2023 8:33:38 AM Version By: Marolyn Nest MD FACS Entered By: Drury Nestle on 06/28/2023 10:00:01 -------------------------------------------------------------------------------- SuperBill Details Patient Name: Date of Service: EZZARD [name] MICAEL 06/27/2023 Medical Record Number: [id number] Patient Account Number: [id number] Date of Birth/Sex: Treating RN: [birth year] (73 y.o. 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 L97.828 Non-pressure chronic ulcer of other part of left lower leg with other specified severity I89.0 Lymphedema, not elsewhere classified I87.322 Chronic venous hypertension (idiopathic) with inflammation of left lower extremity L85.9 Epidermal thickening, unspecified E11.51 Type 2 diabetes mellitus with diabetic peripheral angiopathy without gangrene Facility Procedures : CPT4 Code: [id number] Description: 11042 - DEB SUBQ TISSUE 20 SQ CM/< ICD-10 Diagnosis Description L97.528 Non-pressure chronic ulcer of other part of left foot with other specified sever L97.128 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 L97.828  Non-pressure chronic ulcer of other part of left lower leg with other specified E11.51 Type 2 diabetes mellitus with diabetic peripheral angiopathy without gangrene Modifier: ity severity Quantity: 1 Physician Procedures : CPT4 Code Description Modifier [id number] 11042 - WC PHYS SUBQ TISS 20 SQ CM ICD-10 Diagnosis Description 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 with  other specified severity L97.828 Non-pressure chronic ulcer of other part of left lower leg with other specified severity E11.51 Type 2 diabetes mellitus with diabetic peripheral angiopathy without gangrene EZZARD UBALDO ORN ([id number])  [id number] Page 22 Quantity: 1 of 22 Electronic Signature(s) Signed: 06/27/2023 8:34:58 AM By: Marolyn Nest MD FACS Entered By: Marolyn Nest on 06/27/2023 05:34:57

## 2023-07-04 ENCOUNTER — Ambulatory Visit (HOSPITAL_BASED_OUTPATIENT_CLINIC_OR_DEPARTMENT_OTHER): Payer: BC Managed Care – PPO | Admitting: General Surgery

## 2023-07-11 ENCOUNTER — Encounter (HOSPITAL_BASED_OUTPATIENT_CLINIC_OR_DEPARTMENT_OTHER): Payer: Medicare Other | Admitting: General Surgery

## 2023-07-11 DIAGNOSIS — L97828 Non-pressure chronic ulcer of other part of left lower leg with other specified severity: Secondary | ICD-10-CM | POA: Diagnosis not present

## 2023-07-11 NOTE — Progress Notes (Signed)
IZYAN, MAGSTADT ([id number]) [id number] Page 1 of 15 Visit Report for 07/11/2023 Arrival Information Details Patient Name: Date of Service: [name] [name] [name] 07/11/2023 10:00 A M Medical Record Number: [id number] Patient Account Number: [id number] Date of Birth/Sex: Treating RN: June 10, 1951 (73 y.o. Dianna Limbo Primary Care Lory Nowaczyk: [name] Other Clinician: Referring Demontae Antunes: Treating Daziya Redmond/Extender: [name] in Treatment: 117 Visit Information History Since Last Visit Added or deleted any medications: No Patient Arrived: Ambulatory Any new allergies or adverse reactions: No Arrival Time: 09:58 Had a fall or experienced change in No Accompanied By: self activities of daily living that may affect Transfer Assistance: None risk of falls: Patient Identification Verified: Yes Signs or symptoms of abuse/neglect since last visito No Patient Requires Transmission-Based Precautions: No Hospitalized since last visit: No Patient Has Alerts: Yes Implantable device outside of the clinic excluding No cellular tissue based products placed in the center since last visit: Has Dressing in Place as Prescribed: Yes Pain Present Now: No Electronic Signature(s) Signed: 07/11/2023 1:07:26 PM By: Karie Schwalbe RN Entered By: Karie Schwalbe on 07/11/2023 09:59:09 -------------------------------------------------------------------------------- Encounter Discharge Information Details Patient Name: Date of Service: [name] Grapes. 07/11/2023 10:00 A M Medical Record Number: [id number] Patient Account Number: [id number] Date of Birth/Sex: Treating RN: Feb 01, 1951 (73 y.o. Dianna Limbo Primary Care Chrystian Ressler: [name] Other Clinician: Referring Oona Trammel: Treating Mansur Patti/Extender: [name] in Treatment: 117 Encounter Discharge Information Items Post Procedure Vitals Discharge Condition:  Stable Temperature (F): 98.3 Ambulatory Status: Ambulatory Pulse (bpm): 74 Discharge Destination: Home Respiratory Rate (breaths/min): 16 Transportation: Private Auto Blood Pressure (mmHg): 115/70 Accompanied By: self Schedule Follow-up Appointment: No Clinical Summary of Care: Electronic Signature(s) Signed: 07/11/2023 1:07:26 PM By: Karie Schwalbe RN Entered By: Karie Schwalbe on 07/11/2023 13:06:59 [name] ([id number]) [id number] Page 2 of 15 -------------------------------------------------------------------------------- Lower Extremity Assessment Details Patient Name: Date of Service: [name] [name] [name] [name] [name] [name] 07/11/2023 10:00 A M Medical Record Number: [id number] Patient Account Number: [id number] Date of Birth/Sex: Treating RN: [birth year] (73 y.o. Dianna Limbo Primary Care Leiyah Maultsby: [name] Other Clinician: Referring Gurveer Colucci: Treating Pamela Maddy/Extender: [name] in Treatment: 117 Edema Assessment Assessed: [name]: No] [name]: No] Edema: [Left: Ye] [Right: s] Calf Left: Right: Point of Measurement: 41 cm From Medial Instep 47 cm Ankle Left: Right: Point of Measurement: 10 cm From Medial Instep 28 cm Vascular Assessment Pulses: Dorsalis Pedis Palpable: [Left:Yes] Extremity colors, hair growth, and conditions: Extremity Color: [Left:Hyperpigmented] Hair Growth on Extremity: [Left:No] Temperature of Extremity: [Left:Warm] Capillary Refill: [Left:< 3 seconds] Dependent Rubor: [Left:No No] Electronic Signature(s) Signed: 07/11/2023 1:07:26 PM By: Karie Schwalbe RN Entered By: Karie Schwalbe on 07/11/2023 10:11:01 -------------------------------------------------------------------------------- Multi Wound Chart Details Patient Name: Date of Service: [name] Grapes. 07/11/2023 10:00 A M Medical Record Number: [id number] Patient Account Number: [id number] Date of Birth/Sex: Treating RN: 06-11-1951 (73 y.o.  M) Primary Care Ronnie Doo: [name] Other Clinician: Referring Kellon Chalk: Treating Lyrah Bradt/Extender: [name] in Treatment: 117 Vital Signs Height(in): 74 Pulse(bpm): 74 Weight(lbs): 238 Blood Pressure(mmHg): 115/70 Body Mass Index(BMI): 30.6 Temperature(F): 98.3 Respiratory Rate(breaths/min): 16 [18R:Photos:] [id number] Page 3 of 15] Left, Plantar Metatarsal head first Left, Proximal, Lateral Lower Leg Left, Anterior Lower Leg Wound Location: Gradually Appeared Bump Skin Tear/Laceration Wounding Event: 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 Primary Etiology: Extremity Extremity Glaucoma, Sleep Apnea, Glaucoma, Sleep Apnea, Glaucoma, Sleep Apnea, Comorbid History: Hypertension, Peripheral Arterial Hypertension, Peripheral Arterial Hypertension, Peripheral  Arterial Disease, Peripheral Venous Disease, Disease, Peripheral Venous Disease, Disease, Peripheral Venous Disease, Type II Diabetes, Gout, Osteoarthritis, Type II Diabetes, Gout, Osteoarthritis, Type II Diabetes, Gout, Osteoarthritis, Neuropathy Neuropathy Neuropathy 08/23/2020 06/03/2021 05/02/2023 Date Acquired: 117 109 10 Weeks of Treatment: Open Open Open Wound Status: Yes Yes No Wound Recurrence: No Yes No Clustered Wound: N/A 0 N/A Clustered Quantity: 2.5x2.6x0.3 1x0.7x0.1 1x0.5x0.1 Measurements L x [name] [name] x D (cm) 5.105 0.55 0.393 A (cm) : rea 1.532 0.055 0.039 Volume (cm) : 63.90% 66.60% 7.30% % Reduction in A rea: 45.80% 95.80% 7.10% % Reduction in Volume: 6 Starting Position 1 (o'clock): 9 Ending Position 1 (o'clock): 0.3 Maximum Distance 1 (cm): Yes No No Undermining: Grade 2 Full Thickness With Exposed Support Grade 1 Classification: Structures Medium Medium Medium Exudate A mount: Serosanguineous Serous Serous Exudate Type: red, brown amber amber Exudate Color: Epibole Distinct, outline attached  Distinct, outline attached Wound Margin: Large (67-100%) Large (67-100%) Medium (34-66%) Granulation A mount: Red Red Red, Pink Granulation Quality: Small (1-33%) Small (1-33%) Medium (34-66%) Necrotic A mount: Adherent Slough Eschar, Adherent Slough Adherent Slough Necrotic Tissue: Fat Layer (Subcutaneous Tissue): Yes Fat Layer (Subcutaneous Tissue): Yes Fat Layer (Subcutaneous Tissue): Yes Exposed Structures: Fascia: No Fascia: No Fascia: No Tendon: No Tendon: No Tendon: No Muscle: No Muscle: No Muscle: No Joint: No Joint: No Joint: No Bone: No Bone: No Bone: No Small (1-33%) Medium (34-66%) Medium (34-66%) Epithelialization: Debridement - Excisional Debridement - Excisional Debridement - Excisional Debridement: Pre-procedure Verification/Time Out 10:20 10:20 10:20 Taken: N/A Lidocaine 4% Topical Solution Lidocaine 4% Topical Solution Pain Control: Callus, Subcutaneous, Slough Subcutaneous, Slough Subcutaneous, Slough Tissue Debrided: Skin/Subcutaneous Tissue Skin/Subcutaneous Tissue Skin/Subcutaneous Tissue Level: 6.38 0.55 0.39 Debridement A (sq cm): rea Curette Curette Curette Instrument: Minimum Minimum Minimum Bleeding: Pressure Pressure Pressure Hemostasis A chieved: Procedure was tolerated well Procedure was tolerated well Procedure was tolerated well Debridement Treatment Response: 2.5x2.6x0.3 1x0.7x0.1 1x0.5x0.1 Post Debridement Measurements L x [name] [name] x D (cm) 1.532 0.055 0.039 Post Debridement Volume: (cm) Callus: Yes Scarring: Yes No Abnormalities Noted Periwound Skin Texture: Maceration: Yes Dry/Scaly: Yes Dry/Scaly: Yes Periwound Skin Moisture: Dry/Scaly: No Maceration: No No Abnormalities Noted No Abnormalities Noted Hemosiderin Staining: Yes Periwound Skin Color: No Abnormality No Abnormality No Abnormality Temperature: Debridement Debridement Debridement Procedures Performed: Wound Number: [id number] Photos: Left, Distal, Anterior  Lower Leg Left, Proximal, Dorsal Foot Left, Distal, Dorsal Foot Wound Location: Shear/Friction Shear/Friction Shear/Friction Wounding Event: Diabetic Wound/Ulcer of the Lower Diabetic Wound/Ulcer of the Lower Diabetic Wound/Ulcer of the Lower Primary Etiology: Extremity Extremity Extremity [name] [name] [name] [name] [name] [name] ([id number]) [id number] Page 4 of 15 Glaucoma, Sleep Apnea, Glaucoma, Sleep Apnea, Glaucoma, Sleep Apnea, Comorbid History: Hypertension, Peripheral Arterial Hypertension, Peripheral Arterial Hypertension, Peripheral Arterial Disease, Peripheral Venous Disease, Disease, Peripheral Venous Disease, Disease, Peripheral Venous Disease, Type II Diabetes, Gout, Osteoarthritis, Type II Diabetes, Gout, Osteoarthritis, Type II Diabetes, Gout, Osteoarthritis, Neuropathy Neuropathy Neuropathy 07/08/2023 07/08/2023 07/08/2023 Date Acquired: 0 0 0 Weeks of Treatment: Open Open Open Wound Status: No No No Wound Recurrence: No No No Clustered Wound: N/A N/A N/A Clustered Quantity: 0.5x1.5x0.1 0.5x0.5x0.1 0.6x0.3x0.1 Measurements L x [name] [name] x D (cm) 0.589 0.196 0.141 A (cm) : rea 0.059 0.02 0.014 Volume (cm) : N/A N/A N/A % Reduction in A rea: N/A N/A N/A % Reduction in Volume: No No No Undermining: Grade 1 Grade 1 Grade 1 Classification: Medium Medium Medium Exudate A mount: Serosanguineous Serosanguineous Serosanguineous Exudate Type: red, brown red, brown red, brown Exudate Color: Distinct, outline attached N/A  Distinct, outline attached Wound Margin: Large (67-100%) Large (67-100%) Large (67-100%) Granulation A mount: Red Red Red Granulation Quality: Small (1-33%) Small (1-33%) Small (1-33%) Necrotic A mount: Adherent Slough Adherent [employer] Necrotic Tissue: Fat Layer (Subcutaneous Tissue): Yes Fat Layer (Subcutaneous Tissue): Yes Fat Layer (Subcutaneous Tissue): Yes Exposed Structures: Medium (34-66%) Small (1-33%) Medium  (34-66%) Epithelialization: Debridement - Excisional Debridement - Excisional Debridement - Excisional Debridement: Pre-procedure Verification/Time Out 10:20 10:20 10:20 Taken: Lidocaine 4% Topical Solution N/A Lidocaine 4% Topical Solution Pain Control: Subcutaneous, Slough Subcutaneous, Slough Subcutaneous, Slough Tissue Debrided: Skin/Subcutaneous Tissue Skin/Subcutaneous Tissue Skin/Subcutaneous Tissue Level: 0.59 0.2 0.14 Debridement A (sq cm): rea Curette Curette Curette Instrument: Minimum Minimum Minimum Bleeding: Pressure Pressure Pressure Hemostasis A chieved: Procedure was tolerated well Procedure was tolerated well Procedure was tolerated well Debridement Treatment Response: 0.5x1.5x0.1 0.5x0.5x0.1 0.6x0.3x0.1 Post Debridement Measurements L x [name] [name] x D (cm) 0.059 0.02 0.014 Post Debridement Volume: (cm) Scarring: Yes Scarring: Yes Scarring: Yes Periwound Skin Texture: No Abnormalities Noted No Abnormalities Noted No Abnormalities Noted Periwound Skin Moisture: Hemosiderin Staining: Yes Hemosiderin Staining: No No Abnormalities Noted Periwound Skin Color: No Abnormality No Abnormality No Abnormality Temperature: Debridement Debridement Debridement Procedures Performed: Treatment Notes Electronic Signature(s) Signed: 07/11/2023 10:50:32 AM By: [name] MD FACS Entered By: [name] on 07/11/2023 10:50:32 -------------------------------------------------------------------------------- Multi-Disciplinary Care Plan Details Patient Name: Date of Service: [name] Grapes. 07/11/2023 10:00 A M Medical Record Number: [id number] Patient Account Number: [id number] Date of Birth/Sex: Treating RN: 06-06-1951 (73 y.o. Dianna Limbo Primary Care Selah Klang: [name] Other Clinician: Referring Roland Prine: Treating Kaylem Gidney/Extender: [name] in Treatment: 117 Multidisciplinary Care Plan reviewed with physician Active  Inactive Venous Leg Ulcer Nursing Diagnoses: JOAOPEDRO, JULICH ([id number]) [id number] Page 5 of 15 Knowledge deficit related to disease process and management Potential for venous Insuffiency (use before diagnosis confirmed) Goals: Patient will maintain optimal edema control Date Initiated: 07/27/2021 Target Resolution Date: 08/23/2023 Goal Status: Active Interventions: Assess peripheral edema status every visit. Treatment Activities: Therapeutic compression applied : 07/27/2021 Notes: Wound/Skin Impairment Nursing Diagnoses: Impaired tissue integrity Knowledge deficit related to ulceration/compromised skin integrity Goals: Patient will have a decrease in wound volume by X% from date: (specify in notes) Date Initiated: 04/12/2021 Date Inactivated: 01/04/2022 Target Resolution Date: 04/23/2021 Goal Status: Met Patient/caregiver will verbalize understanding of skin care regimen Date Initiated: 01/04/2022 Target Resolution Date: 08/23/2023 Goal Status: Active Ulcer/skin breakdown will have a volume reduction of 30% by week 4 Date Initiated: 04/12/2021 Date Inactivated: 04/27/2021 Target Resolution Date: 04/27/2021 Goal Status: Unmet Unmet Reason: infection Ulcer/skin breakdown will have a volume reduction of 50% by week 8 Date Initiated: 04/27/2021 Date Inactivated: 06/29/2021 Target Resolution Date: 06/24/2021 Goal Status: Met Interventions: Assess patient/caregiver ability to obtain necessary supplies Assess patient/caregiver ability to perform ulcer/skin care regimen upon admission and as needed Assess ulceration(s) every visit Notes: Electronic Signature(s) Signed: 07/11/2023 1:07:26 PM By: Karie Schwalbe RN Entered By: Karie Schwalbe on 07/11/2023 13:05:38 -------------------------------------------------------------------------------- Pain Assessment Details Patient Name: Date of Service: [name] Grapes. 07/11/2023 10:00 A M Medical Record Number:  [id number] Patient Account Number: [id number] Date of Birth/Sex: Treating RN: [birth year] (73 y.o. Dianna Limbo Primary Care Marquesa Rath: [name] Other Clinician: Referring Rueben Kassim: Treating Wojciech Willetts/Extender: [name] in Treatment: 117 Active Problems Location of Pain Severity and Description of Pain Patient Has Paino No Site Locations BRENAN, LAVENDER ([id number]) [id number] Page 6 of 15 Pain Management and Medication Current Pain Management: Electronic Signature(s) Signed:  07/11/2023 1:07:26 PM By: Karie Schwalbe RN Entered By: Karie Schwalbe on 07/11/2023 09:58:18 -------------------------------------------------------------------------------- Wound Assessment Details Patient Name: Date of Service: [name] Grapes. 07/11/2023 10:00 A M Medical Record Number: [id number] Patient Account Number: [id number] Date of Birth/Sex: Treating RN: Jul 07, 1950 (73 y.o. Dianna Limbo Primary Care Ezrah Panning: [name] Other Clinician: Referring Kimberlie Csaszar: Treating Jamariah Tony/Extender: [name] in Treatment: 117 Wound Status Wound Number: [id number] Primary Diabetic Wound/Ulcer of the Lower Extremity Etiology: Wound Location: Left, Plantar Metatarsal head first Wound Open Wounding Event: Gradually Appeared Status: Date Acquired: 08/23/2020 Comorbid Glaucoma, Sleep Apnea, Hypertension, Peripheral Arterial Disease, Weeks Of Treatment: 117 History: Peripheral Venous Disease, Type II Diabetes, Gout, Osteoarthritis, Clustered Wound: No Neuropathy Photos Wound Measurements Length: (cm) 2. Width: (cm) 2. Depth: (cm) 0. Area: (cm) 5 Volume: (cm) 1 [name] [name] [name] [name] [name] [name] ([id number]) 5 % Reduction in Area: 63.9% 6 % Reduction in Volume: 45.8% 3 Epithelialization: Small (1-33%) .105 Tunneling: No .532 Undermining: Yes Starting Position (o'clock): 6 Ending Position (o'clock): 9 Maximum Distance: (cm)  0.3 [id number] Page 7 of 15 Wound Description Classification: Grade 2 Wound Margin: Epibole Exudate Amount: Medium Exudate Type: Serosanguineous Exudate Color: red, brown Foul Odor After Cleansing: No Slough/Fibrino Yes Wound Bed Granulation Amount: Large (67-100%) Exposed Structure Granulation Quality: Red Fascia Exposed: No Necrotic Amount: Small (1-33%) Fat Layer (Subcutaneous Tissue) Exposed: Yes Necrotic Quality: Adherent Slough Tendon Exposed: No Muscle Exposed: No Joint Exposed: No Bone Exposed: No Periwound Skin Texture Texture Color No Abnormalities Noted: No No Abnormalities Noted: Yes Callus: Yes Temperature / Pain Temperature: No Abnormality Moisture No Abnormalities Noted: No Dry / Scaly: No Maceration: Yes Treatment Notes Wound #18R (Metatarsal head first) Wound Laterality: Plantar, Left Cleanser Normal Saline Discharge Instruction: Cleanse the wound with Normal Saline prior to applying a clean dressing using gauze sponges, not tissue or cotton balls. Soap and Water Discharge Instruction: May shower and wash wound with dial antibacterial soap and water prior to dressing change. Wound Cleanser Discharge Instruction: Cleanse the wound with wound cleanser prior to applying a clean dressing using gauze sponges, not tissue or cotton balls. Peri-Wound Care Topical Skintegrity Hydrogel 4 (oz) Discharge Instruction: Apply hydrogel as directed Primary Dressing Promogran Prisma Matrix, 4.34 (sq in) (silver collagen) Discharge Instruction: Moisten collagen with saline or hydrogel Secondary Dressing Optifoam Non-Adhesive Dressing, 4x4 in Discharge Instruction: Apply over primary dressing as directed. Woven Gauze Sponges 2x2 in Discharge Instruction: Apply over primary dressing as directed. Secured With 8M Medipore H Soft Cloth Surgical T ape, 4 x 10 (in/yd) Discharge Instruction: Secure with tape as directed. Compression  Wrap Compression Stockings Add-Ons Electronic Signature(s) Signed: 07/11/2023 1:07:26 PM By: Karie Schwalbe RN Entered By: Karie Schwalbe on 07/11/2023 10:21:37 [name] ([id number]) [id number] Page 8 of 15 -------------------------------------------------------------------------------- Wound Assessment Details Patient Name: Date of Service: [name] [name] [name] [name] [name] [name] 07/11/2023 10:00 A M Medical Record Number: [id number] Patient Account Number: [id number] Date of Birth/Sex: Treating RN: 1951-06-02 (73 y.o. Dianna Limbo Primary Care Kiante Ciavarella: [name] Other Clinician: Referring Ramyah Pankowski: Treating Teddy Pena/Extender: [name] in Treatment: 117 Wound Status Wound Number: [id number] Primary Cyst Etiology: Wound Location: Left, Proximal, Lateral Lower Leg Wound Open Wounding Event: Bump Status: Date Acquired: 06/03/2021 Comorbid Glaucoma, Sleep Apnea, Hypertension, Peripheral Arterial Disease, Weeks Of Treatment: 109 History: Peripheral Venous Disease, Type II Diabetes, Gout, Osteoarthritis, Clustered Wound: Yes Neuropathy Photos Wound Measurements Length: (cm) Width: (cm) Depth: (cm) Clustered Quantity: Area: (cm) Volume: (cm) 1 % Reduction in Area: 66.6%  0.7 % Reduction in Volume: 95.8% 0.1 Epithelialization: Medium (34-66%) 0 Tunneling: No 0.55 Undermining: No 0.055 Wound Description Classification: Full Thickness With Exposed Support Structures Wound Margin: Distinct, outline attached Exudate Amount: Medium Exudate Type: Serous Exudate Color: amber Foul Odor After Cleansing: No Slough/Fibrino No Wound Bed Granulation Amount: Large (67-100%) Exposed Structure Granulation Quality: Red Fascia Exposed: No Necrotic Amount: Small (1-33%) Fat Layer (Subcutaneous Tissue) Exposed: Yes Necrotic Quality: Eschar, Adherent Slough Tendon Exposed: No Muscle Exposed: No Joint Exposed: No Bone Exposed: No Periwound Skin  Texture Texture Color No Abnormalities Noted: No No Abnormalities Noted: Yes Scarring: Yes Temperature / Pain Temperature: No Abnormality Moisture No Abnormalities Noted: No Dry / Scaly: Yes Maceration: No Treatment Notes Wound #22R (Lower Leg) Wound Laterality: Left, Lateral, Proximal [name] [name] [name] ([id number]) [id number] Page 9 of 15 Soap and Water Discharge Instruction: May shower and wash wound with dial antibacterial soap and water prior to dressing change. Wound Cleanser Discharge Instruction: Cleanse the wound with wound cleanser prior to applying a clean dressing using gauze sponges, not tissue or cotton balls. Peri-Wound Care Topical Skintegrity Hydrogel 4 (oz) Discharge Instruction: Apply hydrogel as directed Primary Dressing Promogran Prisma Matrix, 4.34 (sq in) (silver collagen) Discharge Instruction: Moisten collagen with saline or hydrogel Secondary Dressing Woven Gauze Sponge, Non-Sterile 4x4 in Discharge Instruction: Apply over primary dressing as directed. Secured With 66M Medipore H Soft Cloth Surgical T ape, 4 x 10 (in/yd) Discharge Instruction: Secure with tape as directed. Compression Wrap Compression Stockings Add-Ons Electronic Signature(s) Signed: 07/11/2023 1:07:26 PM By: Karie Schwalbe RN Entered By: Karie Schwalbe on 07/11/2023 10:22:17 -------------------------------------------------------------------------------- Wound Assessment Details Patient Name: Date of Service: [name] Grapes. 07/11/2023 10:00 A M Medical Record Number: [id number] Patient Account Number: [id number] Date of Birth/Sex: Treating RN: [birth year] (73 y.o. Dianna Limbo Primary Care Kimbra Marcelino: [name] Other Clinician: Referring Bryton Romagnoli: Treating Neesa Knapik/Extender: [name] in Treatment: 117 Wound Status Wound Number: 36 Primary Diabetic Wound/Ulcer of the Lower Extremity Etiology: Wound Location:  Left, Anterior Lower Leg Wound Open Wounding Event: Skin Tear/Laceration Status: Date Acquired: 05/02/2023 Comorbid Glaucoma, Sleep Apnea, Hypertension, Peripheral Arterial Disease, Weeks Of Treatment: 10 History: Peripheral Venous Disease, Type II Diabetes, Gout, Osteoarthritis, Clustered Wound: No Neuropathy Photos Wound Measurements Length: (cm) 1 [name] [name] [name] [name] [name] [name] ([id number]) Width: (cm) 0.5 Depth: (cm) 0.1 Area: (cm) 0.393 Volume: (cm) 0.039 % Reduction in Area: 7.3% [id number] Page 10 of 15 % Reduction in Volume: 7.1% Epithelialization: Medium (34-66%) Tunneling: No Undermining: No Wound Description Classification: Grade 1 Wound Margin: Distinct, outline attached Exudate Amount: Medium Exudate Type: Serous Exudate Color: amber Foul Odor After Cleansing: No Slough/Fibrino Yes Wound Bed Granulation Amount: Medium (34-66%) Exposed Structure Granulation Quality: Red, Pink Fascia Exposed: No Necrotic Amount: Medium (34-66%) Fat Layer (Subcutaneous Tissue) Exposed: Yes Necrotic Quality: Adherent Slough Tendon Exposed: No Muscle Exposed: No Joint Exposed: No Bone Exposed: No Periwound Skin Texture Texture Color No Abnormalities Noted: Yes No Abnormalities Noted: No Hemosiderin Staining: Yes Moisture No Abnormalities Noted: Yes Temperature / Pain Temperature: No Abnormality Treatment Notes Wound #36 (Lower Leg) Wound Laterality: Left, Anterior Cleanser Soap and Water Discharge Instruction: May shower and wash wound with dial antibacterial soap and water prior to dressing change. Wound Cleanser Discharge Instruction: Cleanse the wound with wound cleanser prior to applying a clean dressing using gauze sponges, not tissue or cotton balls. Peri-Wound Care Topical Primary Dressing Maxorb Extra Ag+ Alginate Dressing, 2x2 (in/in) Discharge Instruction: Apply to wound bed as instructed Secondary  Dressing Zetuvit Plus Silicone Border Dressing  4x4 (in/in) Discharge Instruction: Apply silicone border over primary dressing as directed. Secured With Compression Wrap Compression Stockings Facilities manager) Signed: 07/11/2023 1:07:26 PM By: Karie Schwalbe RN Entered By: Karie Schwalbe on 07/11/2023 10:22:59 -------------------------------------------------------------------------------- Wound Assessment Details Patient Name: Date of Service: [name] Grapes. 07/11/2023 10:00 A M Medical Record Number: [id number] Patient Account Number: [id number] Date of Birth/Sex: Treating RN: 12/15/1950 (3 y.o. Staci Acosta Verlan Friends, [place] ([id number]) [id number] Page 11 of 15 Primary Care Verta Riedlinger: [name] Other Clinician: Referring Baudelio Karnes: Treating Uilani Sanville/Extender: [name] in Treatment: 117 Wound Status Wound Number: 38 Primary Diabetic Wound/Ulcer of the Lower Extremity Etiology: Wound Location: Left, Distal, Anterior Lower Leg Wound Open Wounding Event: Shear/Friction Status: Date Acquired: 07/08/2023 Comorbid Glaucoma, Sleep Apnea, Hypertension, Peripheral Arterial Disease, Weeks Of Treatment: 0 History: Peripheral Venous Disease, Type II Diabetes, Gout, Osteoarthritis, Clustered Wound: No Neuropathy Photos Wound Measurements Length: (cm) 0.5 Width: (cm) 1.5 Depth: (cm) 0.1 Area: (cm) 0.589 Volume: (cm) 0.059 % Reduction in Area: % Reduction in Volume: Epithelialization: Medium (34-66%) Tunneling: No Undermining: No Wound Description Classification: Grade 1 Wound Margin: Distinct, outline attached Exudate Amount: Medium Exudate Type: Serosanguineous Exudate Color: red, brown Foul Odor After Cleansing: No Slough/Fibrino Yes Wound Bed Granulation Amount: Large (67-100%) Exposed Structure Granulation Quality: Red Fat Layer (Subcutaneous Tissue) Exposed: Yes Necrotic Amount: Small (1-33%) Necrotic Quality: Adherent Slough Periwound Skin  Texture Texture Color No Abnormalities Noted: No No Abnormalities Noted: No Scarring: Yes Hemosiderin Staining: Yes Moisture Temperature / Pain No Abnormalities Noted: Yes Temperature: No Abnormality Treatment Notes Wound #38 (Lower Leg) Wound Laterality: Left, Anterior, Distal Cleanser Soap and Water Discharge Instruction: May shower and wash wound with dial antibacterial soap and water prior to dressing change. Wound Cleanser Discharge Instruction: Cleanse the wound with wound cleanser prior to applying a clean dressing using gauze sponges, not tissue or cotton balls. Peri-Wound Care Topical Skintegrity Hydrogel 4 (oz) Discharge Instruction: Apply hydrogel as directed Primary Dressing Promogran Prisma Matrix, 4.34 (sq in) (silver collagen) Discharge Instruction: Moisten collagen with saline or hydrogel [name] [name] [name] [name] [name] [name] ([id number]) [id number] Page 12 of 15 Secondary Dressing Woven Gauze Sponge, Non-Sterile 4x4 in Discharge Instruction: Apply over primary dressing as directed. Secured With 65M Medipore H Soft Cloth Surgical T ape, 4 x 10 (in/yd) Discharge Instruction: Secure with tape as directed. Compression Wrap Compression Stockings Add-Ons Electronic Signature(s) Signed: 07/11/2023 1:07:26 PM By: Karie Schwalbe RN Entered By: Karie Schwalbe on 07/11/2023 10:23:40 -------------------------------------------------------------------------------- Wound Assessment Details Patient Name: Date of Service: [name] Grapes. 07/11/2023 10:00 A M Medical Record Number: [id number] Patient Account Number: [id number] Date of Birth/Sex: Treating RN: 18-Nov-1950 (73 y.o. Dianna Limbo Primary Care Schae Cando: [name] Other Clinician: Referring Arush Gatliff: Treating Choya Tornow/Extender: [name] in Treatment: 117 Wound Status Wound Number: 39 Primary Diabetic Wound/Ulcer of the Lower Extremity Etiology: Wound Location: Left,  Proximal, Dorsal Foot Wound Open Wounding Event: Shear/Friction Status: Date Acquired: 07/08/2023 Comorbid Glaucoma, Sleep Apnea, Hypertension, Peripheral Arterial Disease, Weeks Of Treatment: 0 History: Peripheral Venous Disease, Type II Diabetes, Gout, Osteoarthritis, Clustered Wound: No Neuropathy Photos Wound Measurements Length: (cm) 0.5 Width: (cm) 0.5 Depth: (cm) 0.1 Area: (cm) 0.196 Volume: (cm) 0.02 % Reduction in Area: % Reduction in Volume: Epithelialization: Small (1-33%) Tunneling: No Undermining: No Wound Description Classification: Grade 1 Exudate Amount: Medium Exudate Type: Serosanguineous Exudate Color: red, brown Foul Odor After Cleansing: No Slough/Fibrino Yes Wound Bed Granulation Amount: Large (  67-100%) Exposed Structure Granulation Quality: Red Fat Layer (Subcutaneous Tissue) Exposed: [address] [name] [name] [name] [name] [name] [name] ([id number]) [id number] Page 13 of 15 Necrotic Amount: Small (1-33%) Necrotic Quality: Adherent Slough Periwound Skin Texture Texture Color No Abnormalities Noted: No No Abnormalities Noted: Yes Scarring: Yes Temperature / Pain Temperature: No Abnormality Moisture No Abnormalities Noted: Yes Treatment Notes Wound #39 (Foot) Wound Laterality: Dorsal, Left, Proximal Cleanser Soap and Water Discharge Instruction: May shower and wash wound with dial antibacterial soap and water prior to dressing change. Wound Cleanser Discharge Instruction: Cleanse the wound with wound cleanser prior to applying a clean dressing using gauze sponges, not tissue or cotton balls. Peri-Wound Care Topical Skintegrity Hydrogel 4 (oz) Discharge Instruction: Apply hydrogel as directed Primary Dressing Promogran Prisma Matrix, 4.34 (sq in) (silver collagen) Discharge Instruction: Moisten collagen with saline or hydrogel Secondary Dressing Woven Gauze Sponge, Non-Sterile 4x4 in Discharge Instruction: Apply over primary dressing as  directed. Secured With 96M Medipore H Soft Cloth Surgical T ape, 4 x 10 (in/yd) Discharge Instruction: Secure with tape as directed. Compression Wrap Compression Stockings Add-Ons Electronic Signature(s) Signed: 07/11/2023 1:07:26 PM By: Karie Schwalbe RN Entered By: Karie Schwalbe on 07/11/2023 10:24:33 -------------------------------------------------------------------------------- Wound Assessment Details Patient Name: Date of Service: [name] Grapes. 07/11/2023 10:00 A M Medical Record Number: [id number] Patient Account Number: [id number] Date of Birth/Sex: Treating RN: [birth year] (73 y.o. Dianna Limbo Primary Care Jeana Kersting: [name] Other Clinician: Referring Maziah Smola: Treating Fitz Matsuo/Extender: [name] in Treatment: 117 Wound Status Wound Number: 40 Primary Diabetic Wound/Ulcer of the Lower Extremity Etiology: Wound Location: Left, Distal, Dorsal Foot Wound Open Wounding Event: Shear/Friction Status: Date Acquired: 07/08/2023 Comorbid Glaucoma, Sleep Apnea, Hypertension, Peripheral Arterial Disease, Weeks Of Treatment: 0 History: Peripheral Venous Disease, Type II Diabetes, Gout, Osteoarthritis, Clustered Wound: No Neuropathy Photos [name] [name] [name] [name] [name] [name] ([id number]) [id number] Page 14 of 15 Wound Measurements Length: (cm) 0.6 Width: (cm) 0.3 Depth: (cm) 0.1 Area: (cm) 0.141 Volume: (cm) 0.014 % Reduction in Area: % Reduction in Volume: Epithelialization: Medium (34-66%) Tunneling: No Undermining: No Wound Description Classification: Grade 1 Wound Margin: Distinct, outline attached Exudate Amount: Medium Exudate Type: Serosanguineous Exudate Color: red, brown Foul Odor After Cleansing: No Slough/Fibrino Yes Wound Bed Granulation Amount: Large (67-100%) Exposed Structure Granulation Quality: Red Fat Layer (Subcutaneous Tissue) Exposed: Yes Necrotic Amount: Small (1-33%) Necrotic Quality: Adherent  Slough Periwound Skin Texture Texture Color No Abnormalities Noted: No No Abnormalities Noted: Yes Scarring: Yes Temperature / Pain Temperature: No Abnormality Moisture No Abnormalities Noted: Yes Treatment Notes Wound #40 (Foot) Wound Laterality: Dorsal, Left, Distal Cleanser Soap and Water Discharge Instruction: May shower and wash wound with dial antibacterial soap and water prior to dressing change. Wound Cleanser Discharge Instruction: Cleanse the wound with wound cleanser prior to applying a clean dressing using gauze sponges, not tissue or cotton balls. Peri-Wound Care Topical Skintegrity Hydrogel 4 (oz) Discharge Instruction: Apply hydrogel as directed Primary Dressing Promogran Prisma Matrix, 4.34 (sq in) (silver collagen) Discharge Instruction: Moisten collagen with saline or hydrogel Secondary Dressing Woven Gauze Sponge, Non-Sterile 4x4 in Discharge Instruction: Apply over primary dressing as directed. Secured With 96M Medipore H Soft Cloth Surgical T ape, 4 x 10 (in/yd) Discharge Instruction: Secure with tape as directed. Compression Wrap Compression Stockings Add-Ons [name] [name] [name] [name] [name] [name] ([id number]) [phone].pdf Page 15 of 15 Electronic Signature(s) Signed: 07/11/2023 1:07:26 PM By: Karie Schwalbe RN Entered By: Karie Schwalbe on 07/11/2023 10:25:15 -------------------------------------------------------------------------------- Vitals Details Patient Name: Date of Service: [name] Grapes. 07/11/2023 10:00 A M  Medical Record Number: [id number] Patient Account Number: [id number] Date of Birth/Sex: Treating RN: 03/02/1951 (73 y.o. Dianna Limbo Primary Care Jasmin Trumbull: [name] Other Clinician: Referring Lynne Righi: Treating Leverett Camplin/Extender: [name] in Treatment: 117 Vital Signs Time Taken: 09:57 Temperature (F): 98.3 Height (in): 74 Pulse (bpm): 74 Weight (lbs): 238 Respiratory Rate (breaths/min):  16 Body Mass Index (BMI): 30.6 Blood Pressure (mmHg): 115/70 Reference Range: 80 - 120 mg / dl Electronic Signature(s) Signed: 07/11/2023 1:07:26 PM By: Karie Schwalbe RN Entered By: Karie Schwalbe on 07/11/2023 09:58:00

## 2023-07-11 NOTE — Progress Notes (Signed)
JAQUARI, FALLEN ([id number]) [id number] Page 1 of 25 Visit Report for 07/11/2023 Chief Complaint Document Details Patient Name: Date of Service: [name] 07/11/2023 10:00 A M Medical Record Number: [id number] Patient Account Number: [id number] Date of Birth/Sex: Treating RN: 1951-06-14 (73 y.o. M) Primary Care Provider: [name] Other Clinician: Referring Provider: Treating Provider/Extender: [name] in Treatment: 117 Information Obtained from: Patient Chief Complaint Left leg and foot ulcers 04/12/2021; patient is here for wounds on his left lower leg and left plantar foot over the first metatarsal head Electronic Signature(s) Signed: 07/11/2023 10:50:44 AM By: [name] MD FACS Entered By: [name] on 07/11/2023 10:50:44 -------------------------------------------------------------------------------- Debridement Details Patient Name: Date of Service: [name]. 07/11/2023 10:00 A M Medical Record Number: [id number] Patient Account Number: [id number] Date of Birth/Sex: Treating RN: 10/04/1950 (73 y.o. Dianna Limbo Primary Care Provider: [name] Other Clinician: Referring Provider: Treating Provider/Extender: [name] in Treatment: 117 Debridement Performed for Assessment: Wound #22R Left,Proximal,Lateral Lower Leg Performed By: Physician [name], MD The following information was scribed by: Karie Schwalbe The information was scribed for: [name] Debridement Type: Debridement Level of Consciousness (Pre-procedure): Awake and Alert Pre-procedure Verification/Time Out Yes - 10:20 Taken: Start Time: 10:20 Pain Control: Lidocaine 4% T opical Solution Percent of Wound Bed Debrided: 100% T Area Debrided (cm): otal 0.55 Tissue and other material debrided: Viable, Non-Viable, Slough, Subcutaneous, Slough Level: Skin/Subcutaneous  Tissue Debridement Description: Excisional Instrument: Curette Bleeding: Minimum Hemostasis Achieved: Pressure Response to Treatment: Procedure was tolerated well Level of Consciousness (Post- Awake and Alert procedure): Post Debridement Measurements of Total Wound Length: (cm) 1 Width: (cm) 0.7 Depth: (cm) 0.1 Volume: (cm) 0.055 Character of Wound/Ulcer Post Debridement: Improved [name] ([id number]) [id number] Page 2 of 25 Post Procedure Diagnosis Same as Pre-procedure Electronic Signature(s) Signed: 07/11/2023 11:10:22 AM By: [name] MD FACS Signed: 07/11/2023 1:07:26 PM By: Karie Schwalbe RN Entered By: Karie Schwalbe on 07/11/2023 10:35:34 -------------------------------------------------------------------------------- Debridement Details Patient Name: Date of Service: [name]. 07/11/2023 10:00 A M Medical Record Number: [id number] Patient Account Number: [id number] Date of Birth/Sex: Treating RN: 09-19-1950 (73 y.o. Dianna Limbo Primary Care Provider: [name] Other Clinician: Referring Provider: Treating Provider/Extender: [name] in Treatment: 117 Debridement Performed for Assessment: Wound #36 Left,Anterior Lower Leg Performed By: Physician [name], MD The following information was scribed by: Karie Schwalbe The information was scribed for: [name] Debridement Type: Debridement Severity of Tissue Pre Debridement: Fat layer exposed Level of Consciousness (Pre-procedure): Awake and Alert Pre-procedure Verification/Time Out Yes - 10:20 Taken: Start Time: 10:20 Pain Control: Lidocaine 4% T opical Solution Percent of Wound Bed Debrided: 100% T Area Debrided (cm): otal 0.39 Tissue and other material debrided: Viable, Non-Viable, Slough, Subcutaneous, Slough Level: Skin/Subcutaneous Tissue Debridement Description: Excisional Instrument: Curette Bleeding:  Minimum Hemostasis Achieved: Pressure Response to Treatment: Procedure was tolerated well Level of Consciousness (Post- Awake and Alert procedure): Post Debridement Measurements of Total Wound Length: (cm) 1 Width: (cm) 0.5 Depth: (cm) 0.1 Volume: (cm) 0.039 Character of Wound/Ulcer Post Debridement: Improved Severity of Tissue Post Debridement: Fat layer exposed Post Procedure Diagnosis Same as Pre-procedure Electronic Signature(s) Signed: 07/11/2023 11:10:22 AM By: [name] MD FACS Signed: 07/11/2023 1:07:26 PM By: Karie Schwalbe RN Entered By: Karie Schwalbe on 07/11/2023 10:36:30 Debridement Details -------------------------------------------------------------------------------- [name] ([id number]) [id number] Page 3 of 25 Patient Name: Date of Service: [name]. 07/11/2023 10:00 A  M Medical Record Number: [id number] Patient Account Number: [id number] Date of Birth/Sex: Treating RN: July 13, 1950 (73 y.o. Dianna Limbo Primary Care Provider: [name] Other Clinician: Referring Provider: Treating Provider/Extender: [name] in Treatment: 117 Debridement Performed for Assessment: Wound #38 Left,Distal,Anterior Lower Leg Performed By: Physician [name], MD The following information was scribed by: Karie Schwalbe The information was scribed for: [name] Debridement Type: Debridement Severity of Tissue Pre Debridement: Fat layer exposed Level of Consciousness (Pre-procedure): Awake and Alert Pre-procedure Verification/Time Out Yes - 10:20 Taken: Start Time: 10:20 Pain Control: Lidocaine 4% T opical Solution Percent of Wound Bed Debrided: 100% T Area Debrided (cm): otal 0.59 Tissue and other material debrided: Viable, Non-Viable, Slough, Subcutaneous, Slough Level: Skin/Subcutaneous Tissue Debridement Description: Excisional Instrument: Curette Bleeding:  Minimum Hemostasis Achieved: Pressure Response to Treatment: Procedure was tolerated well Level of Consciousness (Post- Awake and Alert procedure): Post Debridement Measurements of Total Wound Length: (cm) 0.5 Width: (cm) 1.5 Depth: (cm) 0.1 Volume: (cm) 0.059 Character of Wound/Ulcer Post Debridement: Improved Severity of Tissue Post Debridement: Fat layer exposed Post Procedure Diagnosis Same as Pre-procedure Electronic Signature(s) Signed: 07/11/2023 11:10:22 AM By: [name] MD FACS Signed: 07/11/2023 1:07:26 PM By: Karie Schwalbe RN Entered By: Karie Schwalbe on 07/11/2023 10:37:48 -------------------------------------------------------------------------------- Debridement Details Patient Name: Date of Service: [name]. 07/11/2023 10:00 A M Medical Record Number: [id number] Patient Account Number: [id number] Date of Birth/Sex: Treating RN: 1951-05-09 (73 y.o. Dianna Limbo Primary Care Provider: [name] Other Clinician: Referring Provider: Treating Provider/Extender: [name] in Treatment: 117 Debridement Performed for Assessment: Wound #39 Left,Proximal,Dorsal Foot Performed By: Physician [name], MD The following information was scribed by: Karie Schwalbe The information was scribed for: [name] Debridement Type: Debridement Severity of Tissue Pre Debridement: Fat layer exposed Level of Consciousness (Pre-procedure): Awake and Alert Pre-procedure Verification/Time Out Yes - 10:20 Taken: Start Time: 10:20 Percent of Wound Bed Debrided: 100% T Area Debrided (cm): otal 0.2 Tissue and other material debrided: Viable, Non-Viable, Slough, Subcutaneous, Slough Level: Skin/Subcutaneous Tissue ALYAS, COPPESS ([id number]) [id number] Page 4 of 25 Debridement Description: Excisional Instrument: Curette Bleeding: Minimum Hemostasis Achieved: Pressure Response to Treatment:  Procedure was tolerated well Level of Consciousness (Post- Awake and Alert procedure): Post Debridement Measurements of Total Wound Length: (cm) 0.5 Width: (cm) 0.5 Depth: (cm) 0.1 Volume: (cm) 0.02 Character of Wound/Ulcer Post Debridement: Improved Severity of Tissue Post Debridement: Fat layer exposed Post Procedure Diagnosis Same as Pre-procedure Electronic Signature(s) Signed: 07/11/2023 11:10:22 AM By: [name] MD FACS Signed: 07/11/2023 1:07:26 PM By: Karie Schwalbe RN Entered By: Karie Schwalbe on 07/11/2023 10:38:33 -------------------------------------------------------------------------------- Debridement Details Patient Name: Date of Service: [name]. 07/11/2023 10:00 A M Medical Record Number: [id number] Patient Account Number: [id number] Date of Birth/Sex: Treating RN: 07-11-1950 (73 y.o. Dianna Limbo Primary Care Provider: [name] Other Clinician: Referring Provider: Treating Provider/Extender: [name] in Treatment: 117 Debridement Performed for Assessment: Wound #40 Left,Distal,Dorsal Foot Performed By: Physician [name], MD The following information was scribed by: Karie Schwalbe The information was scribed for: [name] Debridement Type: Debridement Severity of Tissue Pre Debridement: Fat layer exposed Level of Consciousness (Pre-procedure): Awake and Alert Pre-procedure Verification/Time Out Yes - 10:20 Taken: Start Time: 10:20 Pain Control: Lidocaine 4% T opical Solution Percent of Wound Bed Debrided: 100% T Area Debrided (cm): otal 0.14 Tissue and other material debrided: Viable, Non-Viable, Slough, Subcutaneous, Slough Level: Skin/Subcutaneous Tissue Debridement Description: Excisional  Instrument: Curette Bleeding: Minimum Hemostasis Achieved: Pressure Response to Treatment: Procedure was tolerated well Level of Consciousness (Post- Awake and Alert procedure): Post  Debridement Measurements of Total Wound Length: (cm) 0.6 Width: (cm) 0.3 Depth: (cm) 0.1 Volume: (cm) 0.014 Character of Wound/Ulcer Post Debridement: Improved Severity of Tissue Post Debridement: Fat layer exposed Post Procedure Diagnosis Same as [name] ([id number]) [id number] Page 5 of 25 Electronic Signature(s) Signed: 07/11/2023 11:10:22 AM By: [name] MD FACS Signed: 07/11/2023 1:07:26 PM By: Karie Schwalbe RN Entered By: Karie Schwalbe on 07/11/2023 10:39:16 -------------------------------------------------------------------------------- Debridement Details Patient Name: Date of Service: [name]. 07/11/2023 10:00 A M Medical Record Number: [id number] Patient Account Number: [id number] Date of Birth/Sex: Treating RN: 03/12/1951 (73 y.o. Dianna Limbo Primary Care Provider: [name] Other Clinician: Referring Provider: Treating Provider/Extender: [name] in Treatment: 117 Debridement Performed for Assessment: Wound #18R Left,Plantar Metatarsal head first Performed By: Physician [name], MD The following information was scribed by: Karie Schwalbe The information was scribed for: [name] Debridement Type: Debridement Severity of Tissue Pre Debridement: Fat layer exposed Level of Consciousness (Pre-procedure): Awake and Alert Pre-procedure Verification/Time Out Yes - 10:20 Taken: Start Time: 10:20 Percent of Wound Bed Debrided: 125% T Area Debrided (cm): otal 6.38 Tissue and other material debrided: Viable, Non-Viable, Callus, Slough, Subcutaneous, Slough Level: Skin/Subcutaneous Tissue Debridement Description: Excisional Instrument: Curette Bleeding: Minimum Hemostasis Achieved: Pressure Response to Treatment: Procedure was tolerated well Level of Consciousness (Post- Awake and Alert procedure): Post Debridement Measurements of Total Wound Length:  (cm) 2.5 Width: (cm) 2.6 Depth: (cm) 0.3 Volume: (cm) 1.532 Character of Wound/Ulcer Post Debridement: Improved Severity of Tissue Post Debridement: Fat layer exposed Post Procedure Diagnosis Same as Pre-procedure Electronic Signature(s) Signed: 07/11/2023 11:10:22 AM By: [name] MD FACS Signed: 07/11/2023 1:07:26 PM By: Karie Schwalbe RN Entered By: Karie Schwalbe on 07/11/2023 10:39:39 -------------------------------------------------------------------------------- HPI Details Patient Name: Date of Service: [name]. 07/11/2023 10:00 A M Medical Record Number: [id number] Patient Account Number: [id number] Date of Birth/Sex: Treating RN: 02/14/1951 (73 y.o. M) Primary Care Provider: [name] Other Clinician: Referring Provider: Treating Provider/Extender: [name] in Treatment: [address] [place] ([id number]) [id number] Page 6 of 25 History of Present Illness HPI Description: 10/11/17; Mr. Kirley is a 73 year old man who tells me that in 2015 he slipped down the latter traumatizing his left leg. He developed a wound in the same spot the area that we are currently looking at. He states this closed over for the most part although he always felt it was somewhat unstable. In 2016 he hit the same area with the door of his car had this reopened. He tells me that this is never really closed although sometimes an inflow it remains open on a constant basis. He has not been using any specific dressing to this except for topical antibiotics the nature of which were not really sure. His primary doctor did send him to see Dr. Jacinto Halim of interventional cardiology. He underwent an angiogram on 08/06/17 and he underwent a PTA and directional atherectomy of the lesser distal SFA and popliteal arteries which resulted in brisk improvement in blood flow. It was noted that he had 2 vessel runoff through the anterior tibial and peroneal.  He is also been to see vascular and interventional radiologist. He was not felt to have any significant superficial venous insufficiency. Presumably is not a candidate for any ablation. It was suggested he come here for  wound care. The patient is a type II diabetic on insulin. He also has a history of venous insufficiency. ABIs on the left were noncompressible in our clinic 10/21/17; patient we admitted to the clinic last week. 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 We put Iodosorb on him after an aggressive debridement and 3 layer compression. He complained of pain in his ankle and itching with is skin in fact he scratched the area on the medial calf superiorly at the rim of our wraps and he has 2 small open areas in that location today which are new. I changed his primary dressing today to silver collagen. As noted he is already had revascularization and does not have any significant superficial venous insufficiency that would be amenable to ablation 10/28/17; patient admitted to the clinic 2 weeks ago. He has a smaller Wound. Scratch injury from last week revealed. There is large wound over the tibial area. This is smaller. Granulation looks healthy. No need for debridement. 11/04/17; the wound on the left lateral calf looks better. Improved dimensions. Surface of this looks better. We've been maintaining him and Kerlix Coban wraps. He finds this much more comfortable. Silver collagen dressing 11/11/17; left lateral Wound continues to look healthy be making progress. Using a #5 curet I removed removed nonviable skin from the surface of the wound and then necrotic debris from the wound surface. Surface of the wound continues to look healthy. He also has an open area on the left great toenail bed. We've been using topical antibiotics. 11/19/17; left anterior lateral wound continues to look healthy but it's not closed. He also had a  small wound above this on the left leg Initially traumatic wounds in the setting of significant chronic venous insufficiency and stasis dermatitis 11/25/17; left anterior wounds superiorly is closed still a small wound inferiorly. 12/02/17; left anterior tibial area. Arrives today with adherent callus. Post debridement clearly not completely closed. Hydrofera Blue under 3 layer compression. 12/09/17; left anterior tibia. Circumferential eschar however the wound bed looks stable to improved. We've been using Hydrofera Blue under 3 layer compression 12/17/17; left anterior tibia. Apparently this was felt to be closed however when the wrap was taken off there is a skin tear to reopen wounds in the same area we've been using Hydrofera Blue under 3 layer compression 12/23/17 left anterior tibia. Not close to close this week apparently the [facility] was stuck to this again. Still circumferential eschar requiring debridement. I put a contact layer on this this time under the Hydrofera Blue 12/31/17; left anterior tibia. Wound is better slight amount of hyper-granulation. Using Hydrofera Blue over Adaptic. 01/07/18; left anterior tibia. The wound had some surface eschar however after this was removed he has no open wound.he was already revascularized by Dr. Jacinto Halim when he came to our clinic with atherectomy of the left SFA and popliteal artery. He was also sent to interventional radiology for venous reflux studies. He was not felt to have significant reflux but certainly has chronic venous changes of his skin with hemosiderin deposition around this area. He will definitely need to lubricate his skin and wear compression stocking and I've talked to him about this. READMISSION 05/26/2018 This is a now 73 year old man we cared for with traumatic wounds on his left anterior lower extremity. He had been previously revascularized during that admission by Dr. Jacinto Halim. Apparently in follow-up Dr. Jacinto Halim noted that he had  deterioration in  his arterial status. He underwent a stent placement in the distal left SFA on 04/22/2018. 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 He went back to the angiography suite on 04/30/2018 he underwent PTA and balloon angioplasty of the occluded left mid anterior tibial artery, thrombotic occlusion went from 100 to 0% which reconstitutes the posterior tibial artery. He had thrombectomy and aspiration of the peroneal artery. The stent placed in the distal SFA left SFA was still occluded. 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 He has been recuperating at [facility] in [facility] after the most recent hospitalization. He is going home tomorrow. He tells me that 2 weeks ago he traumatized the tip of his left fifth toe. He came in urgently for our review of this. This was a history of before I noted that Dr. Jacinto Halim had already noted dry gangrenous changes of the left fifth toe 06/09/2018; 2-week follow-up. I did contact Dr. Jacinto Halim after his last appointment and he apparently saw 1 of Dr. Verl Dicker colleagues the next day. He does not follow-up with Dr. Jacinto Halim himself until Thursday of this week. 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 Nevertheless there is no evidence of infection no drainage and no pain. He had a new area that this week when we were signing him in today on the left anterior mid tibia area, this is in close proximity to the previous wound we have dealt with in this clinic. 06/23/2018; 2-week follow-up. I did not receive a recent note from Dr. Jacinto Halim to review today. Our office is trying to obtain this. He is apparently not planning to do further vascular interventions and wondered about  compression to try and help with the patient's chronic venous insufficiency. However we are also concerned about the arterial flow. He arrives in clinic today with a new area on the left third toe. The areas on the calf/anterior tibia are close to closing. The left fifth toe is still mummified using Betadine. -In reviewing things with the patient he has what sounds like claudication with mild to moderate amount of activity. 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 I prescribed Levaquin over the phone while we attempted to arrange a plan of care. However the patient called yesterday to report he had low-grade fever and he came in today acutely. There is been a marked deterioration in the left third toe with spreading cellulitis up into the dorsal left foot. He was referred to the emergency room. Readmission: 06/29/2020 patient presents today for reevaluation here in our clinic he was previously treated by Dr. Leanord Hawking at the latter part of 2019 in 2 the beginning of 2020. Subsequently we have not seen him since that time in the interim he did have evaluation with vein and vascular specialist specifically Dr. Bo Mcclintock who did perform quite extensive work for a left femoral to anterior tibial artery bypass. With that being said in the interim the patient has developed significant lymphedema and has wounds that he tells me have really never healed in regard to the incision site on the left leg. He also has multiple wounds on the feet for various reasons some of which is that  he tends to pick at his feet. Fortunately there is no signs of active infection systemically at this time he does have some wounds that are little bit deeper but most are fairly superficial he seems to have good blood flow and overall everything appears to be healthy I see no bone exposed and no obvious signs of osteomyelitis. I do not know that he necessarily needs a x-ray at this point although that  something we could consider depending on how things progress. The patient does have a history of lymphedema, diabetes, this is type II, chronic kidney disease stage III, hypertension, and history of peripheral vascular disease. 07/05/2020; patient admitted last week. Is a patient I remember from 2019 he had a spreading infection involving the left foot and we sent him to the hospital. 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 He subsequently had a left femoral to anterior tibial bypass by Dr.Cain vein and vascular. He also has severe lymphedema with chronic skin changes related to that on the left leg. REUVEN, HILDEBRAND ([id number]) [id number] Page 7 of 25 The most problematic area that was new today was on the left medial great toe. This was apparently a small area last week there was purulent drainage which our intake nurse cultured. Also areas on the left medial foot and heel left lateral foot. He has 2 areas on the left medial calf left lateral calf in the setting of the severe lymphedema. 07/13/2020 on evaluation today patient appears to be doing better in my opinion compared to his last visit. The good news is there is no signs of active infection systemically and locally I do not see any signs of infection either. He did have an x-ray which was negative that is great news he had a culture which showed MRSA but at the same time he is been on the doxycycline which has helped. I do think we may want to extend this for 7 additional days 1/25; patient admitted to the clinic a few weeks ago. 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 We have been putting him under compression his edema control is a lot better but he is severe verricused skin on the left leg. He is really done quite well he still has an open area on the left medial calf and the left medial first metatarsal head. We have been  using silver collagen on the leg silver alginate on the foot 07/27/2020 upon evaluation today patient appears to be doing decently well in regard to his wounds. He still has a lot of dry skin on the left leg. Some of this is starting to peel back and I think he may be able to have them out by removing some that today. Fortunately there is no signs of active infection at this time on the left leg although on the right leg he does appear to have swelling and erythema as well as some mild warmth to touch. This does have been concerned about the possibility of cellulitis although within the differential diagnosis I do think that potentially a DVT has to be at least considered. We need to rule that out before proceeding would just call in the cellulitis. Especially since he is having pain in the posterior aspect of his calf muscle. 2/8; the patient had seen sparingly. He has severe skin changes of chronic lymphedema in the left leg thickened hyperkeratotic verrucous skin. He has an open  wound on the medial part of the left first met head left mid tibia. He also has a rim of nonepithelialized skin in the anterior mid tibia. He brought in the AmLactin lotion that was been prescribed although I am not sure under compression and its utility. There concern about cellulitis on the right lower leg the last time he was here. He was put on on antibiotics. His DVT rule out was negative. The right leg looks fine he is using his stocking on this area 08/10/2020 upon evaluation today patient appears to be doing well with regard to his leg currently. He has been tolerating the dressing changes without complication. Fortunately there is no signs of active infection which is great news. Overall very pleased with where things stand. 2/22; the patient still has an area on the medial part of the left first met his head. This looks better than when I last saw this earlier this month he has a rim of epithelialization but still  some surface debris. Mostly everything on the left leg is healed. There is still a vulnerable in the left mid tibia area. 08/30/2020 upon evaluation today patient appears to be doing much better in regard to his wounds on his foot. 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 Nonetheless I think we will address that today I Minna send in a prescription for him in that regard. Overall though there does not appear to be any signs of significant worsening. 09/07/2020 on evaluation today patient's wounds over his left foot appear to be doing excellent. I do not see any signs of infection there is some callus buildup this can require debridement for certain but overall I feel like he is managing quite nicely. He still using the AmLactin cream which has been beneficial for him as well. 3/22; left foot wound is closed. There is no open area here. He is using ammonium lactate lotion to the lower extremities to help exfoliate dry cracked skin. He has compression stockings from [facility] in [place]. The wound on the medial part of his left first met head is healed today. READMISSION 04/12/2021 Mr. Pavlov is a patient we know fairly well he had a prolonged stay in clinic in 2019 with wounds on his left lateral and left anterior lower extremity in the setting of chronic venous insufficiency. More recently he was here earlier this year with predominantly an area on his left foot first metatarsal head plantar and he says the plantar foot broke down on its not long after we discharged him but he did not come back here. The last few months areas of broken down on his left anterior and again the left lateral lower extremity. The leg itself is very swollen chronically enlarged a lot of hyperkeratotic dry Berry Q skin in the left lower leg. His edema extends well into the thigh. He  was seen by Dr. Randie Heinz. He had ABIs on 03/02/2021 showing an ABI on the right of 1 with a TBI of 0.72 his ABI in the left at 1.09 TBI of 0.99. Monophasic and biphasic waveforms on the right. On the left monophasic waveforms were noted he went on to have an angiogram on 03/27/2021 this showed the aortic aortic and iliac segments were free of flow-limiting stenosis the left common femoral vein to evaluate the left femoral to anterior tibial artery bypass was unobstructed  the bypass was patent without any areas of stenosis. We discharged the patient in bilateral juxta lite stockings but very clearly that was not sufficient to control the swelling and maintain skin integrity. He is clearly going to need compression pumps. 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 This is fortunate because he is on his feet for long periods of time. 10/27; patient comes in with our intake nurse reporting copious amount of green drainage from the left anterior mid tibia the left dorsal foot and to a lesser extent the left medial mid tibia. We left the compression wrap on all week for the amount of edema in his left leg is quite a bit better. We use silver alginate as the primary dressing 11/3; edema control is good. Left anterior lower leg left medial lower leg and the plantar first metatarsal head. The left anterior lower leg required debridement. Deep tissue culture I did of this wound showed MRSA I put him on 10 days of doxycycline which she will start today. We have him in compression wraps. He has a security card and [employer] however he is retiring on November 15. We will need to then get him into a better offloading boot for the left foot perhaps a total contact cast 11/10; edema control is quite good. Left anterior and left medial lower leg wounds in the setting of chronic venous insufficiency and lymphedema. He also has a substantial area over the left plantar first  metatarsal head. 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 He has significant hypergranulation on the left plantar foot wound. The patient is a diabetic but he does not have significant PAD 11/17; edema control is quite good. Left anterior and left medial lower leg wounds look better. The really concerning area remains the area on the left plantar first metatarsal head. He has a rim of epithelialization. 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 Starting today with a forefoot off loader but . possibly a total contact cast. 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 Arrives with a very significant odor from I think all of the wounds on the left leg and the left foot. Because of this I did not put a total contact cast on him today but will could still consider this. His wife was having cataract surgery which is the reason he missed the appointment 12/6. I saw this man 5 days ago with a swelling below the popliteal fossa. I thought he actually might have a Baker's cyst however the DVT rule out study that we could arrange right away was negative the technician told me this was not a ruptured Baker's cyst. We attempted to get this aspirated by under ultrasound guidance in interventional radiology however all they did was an ultrasound however it shows an extensive fluid collection 62 x 8 x 9.4 in the left thigh and left calf. The patient states he thinks this started 8 days ago or so but he really is not complaining of any pain, fever or systemic symptoms. He has not ha 12/20; after some difficulty  I managed to get the patient into see Dr. Randie Heinz. Eventually he was taken into the hospital and had  a drain put in the fluid collection below his left knee posteriorly extending into the posterior thigh. He still has the drain in place. Culture of this showed moderate staff aureus few Morganella and few Klebsiella he is now on doxycycline and ciprofloxacin as suggested by infectious disease he is on this for a month. The drain will remain in place until it stops draining 12/29; he comes in today with the 1 wound on his left leg and the area on the left plantar first met head significantly smaller. Both look healthy. He still has the drain in the left leg. He says he has to change this daily. Follows up with Dr. Randie Heinz on January 11. COLWYN, BAGGARLY ([id number]) [id number] Page 8 of 25 06/29/2021; the wounds that I am following on the left leg and left first met head continued to be quite healthy. However the area where his inferior drain is in place had copious amounts of drainage which was green in color. The wound here is larger. Follows up with Dr. Pascal Lux of vein and vascular his surgeon next week as well as infectious disease. He remains on ciprofloxacin and doxycycline. He is not complaining of excessive pain in either one of the drain areas 1/12; the patient saw vascular surgery and infectious disease. Vascular surgery has left the drain in place as there was still some notable drainage still see him back in 2 weeks. Dr. Dorthula Perfect stop the doxycycline and ciprofloxacin and I do not believe he follows up with them at this point. Culture I did last week showed both doxycycline resistant MRSA and Pseudomonas not sensitive to ciprofloxacin although only in rare titers 1/19; the patient's wound on the left anterior lower leg is just about healed. We have continued healing of the area that was medially on the left leg. Left first plantar metatarsal head continues to get smaller. The major problem here is his 2 drain sites 1 on the left upper calf and lateral thigh. There is  purulent drainage still from the left lateral thigh. I gave him antibiotics last week but we still have recultured. He has the drain in the area I think this is eventually going to have to come out. I suspect there will be a connecting wound to heal here perhaps with improved VAc 1/26; the patient had his drain removed by vein and vascular on 1/25/. This was a large pocket of fluid in his left thigh that seem to tunnel into his left upper calf. 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 His mention his Penrose drain was removed today. He now has a tunneling wound on his left calf and left thigh. Both of these probe widely towards each other although I cannot really prove that they connect. Both wounds on his lower leg anteriorly are closed and his area over the first metatarsal head on his right foot continues to improve. We are using Hydrofera Blue here. He also saw infectious disease culture of the abscess they noted was polymicrobial with MRSA, Morganella and Klebsiella he was treated with doxycycline and ciprofloxacin for 4 weeks ending on 07/03/2021. They did not recommend any further antibiotics. Notable that while he still had the Penrose drain in place last week he had purulent drainage coming out of the inferior IandD site this grew [facility], MRSA and Pseudomonas but there does not appear to be  any active infection in this area today with the drain out and he is not systemically unwell 2/2; with regards to the drain sites the superior one on the thigh actually is closed down the one on the upper left lateral calf measures about 8 and half centimeters which is an improvement seems to be less prominent although still with a lot of drainage. The only remaining wound is over the first metatarsal head on the left foot and this looks to be continuing to improve with Hydrofera Blue. 2/9; the area on his plantar left foot continues to contract. Callus around the wound edge. The  drain sites specifically have not come down in depth. We put the wound VAC on Monday he changed the canister late last night our intake nurse reported a pocket of fluid perhaps caused by our compression wraps 2/16; continued improvement in left foot plantar wound. drainage site in the calf is not improved in terms of depth (wound vac) 2/23; continued improvement in the left foot wound over the first metatarsal head. With regards to the drain sites the area on his thigh laterally is healed however the open area on his calf is small in terms of circumference by still probes in by about 15 cm. Within using the wound VAC. Hydrofera Blue on his foot 08/24/2021: The left first metatarsal head wound continues to improve. The wound bed is healthy with just some surrounding callus. Unfortunately the open drain site on his calf remains open and tunnels at least 15 cm (the extent of a Q-tip). This is despite several weeks of wound VAC treatment. Based on reading back through the notes, there has been really no significant change in the depth of the wound, although the orifice is smaller and the more cranial wound on his thigh has closed. I suspect the tunnel tracks nearly all the way to this location. 08/31/2021: Continued improvement in the left first metatarsal head wound. There has been absolutely no improvement to the long tunnel from his open drain site on his calf. We have tried to get him into see vascular surgery sooner to consider the possibility of simply filleting the tract open and allowing it to heal from the bottom up, likely with a wound VAC. They have not yet scheduled a sooner appointment than his current mid April 09/14/2021: He was seen by vascular surgery and they took him to the operating room last week. They opened a portion of the tunnel, but did not extend the entire length of the known open subcutaneous tract. I read Dr. Darcella Cheshire operative note and it is not clear from that documentation why  only a portion of the tract was opened. The heaped up granulation tissue was curetted and removed from at least some portion of the tract. They did place a wound VAC and applied an Unna boot to the leg. The ulcer on his left first metatarsal head is smaller today. The bed looks good and there is just a small amount of surrounding callus. 09/21/2021: The ulcer on his left first metatarsal head looks to be stalled. There is some callus surrounding the wound but the wound bed itself does not appear particularly dynamic. The tunnel tract on his lateral left leg seems to be roughly the same length or perhaps slightly smaller but the wound bed appears healthy with good granulation tissue. He opened up a new wound on his medial thigh and the site of a prior surgical incision. He says that he did this unconsciously in his  sleep by scratching. 09/28/2021: Unfortunately, the ulcer on his left first metatarsal head has extended underneath the callus toward the dorsum of his foot. The medial thigh wounds are roughly the same. The tunnel on his lateral left leg continues to be problematic; it is longer than we are able to actually probe with a Q-tip. I am still not certain as to why Dr. Randie Heinz did not open this up entirely when he took the patient to the operating room. We will likely be back in the same situation with just a small superficial opening in a long unhealed tract, as the open portion is granulating in nicely. 10/02/2021: The patient was initially scheduled for a nurse visit, but we are also applying a total contact cast today. The plantar foot wound looks clean without significant accumulated callus. We have been applying Prisma silver collagen to the site. 10/05/2021: The patient is here for his first total contact cast change. We have tried using gauze packing strips in the tunnel on his lateral leg wound, but this does not seem to be working any better than the white VAC foam. The foot ulcer looks about  the same with minimal periwound callus. Medial thigh wound is clean with just some overlying eschar. 10/12/2021: The plantar foot wound is stable without any significant accumulation of periwound callus. The surface is viable with good granulation tissue. The medial thigh wounds are much smaller and are epithelializing. On the other hand, he had purulent drainage coming from the tunnel on his lateral leg. He does go back to see Dr. Randie Heinz next week and is planning to ask him why the wound tunnel was not completely opened at the time of his most recent operation. 10/19/2021: The plantar foot wound is markedly improved and has epithelial tissue coming through the surface. The medial thigh wounds are nearly closed with just a tiny open area. He did see Dr. Randie Heinz earlier this week and apparently they did discuss the possibility of opening the sinus tract further and enabling a wound VAC application. Apparently there are some limits as to what Dr. 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 I think if we could get the tract open to the level of the popliteal fossa, this would greatly aid in her ability to get this chart closed. That being said, however, today when I probed the tract with a Q-tip, I was not able to insert the entirety of the Q-tip as I have on previous occasions. The tunnel is shorter by about 4 cm. The surface is clean with good granulation tissue and no further episodes of purulent drainage. 10/30/2021: Last week, the patient underwent surgery and had the long tract in his leg opened. There was a rind that was debrided, according to the operative report. His medial thigh ulcers are closed. The plantar foot wound is clean with a good surface and some built up surrounding callus. 11/06/2021: The overall dimensions of the large wound on his lateral leg remain about the same, but there is good granulation tissue present and the tunneling is a little bit shorter. He  has a new wound on his anterior tibial surface, in the same location where he had a similar lesion in the past. The plantar foot wound is clean with some buildup surrounding callus. Just toward the medial aspect of his foot, however, there is an area of darkening that once debrided, revealed another opening in the skin surface. 11/13/2021: The anterior tibial surface wound is closed. The  plantar foot wound has some surrounding callus buildup. The area of darkening that I debrided last week and revealed an opening in the skin surface has closed again. The tunnel in the large wound on his lateral leg has come in by about 3 cm. There is healthy granulation tissue on the entire wound surface. KORDE, HOPKIN ([id number]) [id number] Page 9 of 25 11/23/2021: The patient was out of town last week and did wet-to-dry dressings on his large wound. 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 Unfortunately, he picked open the wound on his left medial thigh. He says that it was itching and he just could not stop scratching it until it was open again. The wound on his plantar foot is smaller and has not accumulated a tremendous amount of callus. The lateral leg wound is shallower and the tunnel has also decreased in depth. There is just a little bit of slough accumulation on the surface. 11/30/2021: Another portion of his left medial thigh has been opened up. All of these wounds are fairly superficial with just a little bit of slough and eschar accumulation. The wound on his plantar foot is almost closed with just a bit of eschar and periwound callus accumulation. The lateral leg wound is nearly flush with the surrounding skin and the tunnel is markedly shallower. 12/07/2021: There is just 1 open area on his left medial thigh. It is clean with just a little bit of perimeter eschar. The wound on his plantar foot continues  to contract and just has some eschar and periwound callus accumulation. The lateral leg wound is closing at the more distal aspect and the tunnel is smaller. The surface is nearly flush with the surrounding skin and it has a good bed of granulation tissue. 12/14/2021: The thigh and foot wounds are closed. The lateral leg wound has closed over approximately half of its length. The tunnel continues to contract and the surface is now flush with the surrounding skin. The wound bed has robust granulation tissue. 12/22/2021: The thigh and foot wounds have reopened. The foot wound has a lot of callus accumulation around and over it. The thigh wound is tiny with just a little bit of slough in the wound bed. The lateral leg wound continues to contract. His vascular surgeon took the wound VAC off earlier in the week and the patient has been doing wet-to-dry dressings. There is a little slough accumulation on the surface. The tunnel is about 3 cm in depth at this point. 12/28/2021: The thigh wound is closed again. The foot wound has some callus that subsequently has peeled back exposing just a small slit of a wound. The lateral leg wound Is down to about half the size that it originally was and the tunnel is down to about half a centimeter in depth. 01/04/2022: The thigh wound remains closed. The foot wound has heavy callus overlying the wound site. Once this was debrided, the wound was found to be closed. The lateral leg wound is smaller again this week and very superficial. No tunnel could be identified. 01/12/2022: The thigh and foot wounds both remain closed. The lateral leg wound is now nearly flush with the skin surface. There is good granulation tissue present with a light layer of slough. 01/19/2022: Due to the way his wrap was placed, the patient did not change the dressing on his thigh at all and so the foam was saturated and his skin  is macerated. There is a light layer of slough on the wound surface. The  underlying granulation tissue is robust and healthy-appearing. He has heavy callus buildup at the site of his first metatarsal head wound which is still healed. 02/01/2022: He has been in silver alginate. When he removed the dressing from his thigh wound, however, some leg, superficially reopening a portion of the wound that had healed. In addition, underneath the callus at his left first metatarsal head, there appears to be a blister and the wound appears to be open again. 02/08/2022: The lateral leg wound has contracted substantially. There is eschar and a light layer of slough present. He says that it is starting to pull and is uncomfortable. On inspection, there is some puckering of the scar and the eschar is quite dry; this may account for his symptoms. On his first metatarsal head, the wound is much smaller with just some eschar on the surface. The callus has not reaccumulated. He reports that he had a blister come up on his medial thigh wound at the distal aspect. It popped and there is now an opening in his skin again. Looking back through his library of wound photos, there is what looks like a permanent suture just deep to this location and it may be trying to erode through. We have been using silver alginate on his wounds. 02/15/2022: The lateral leg wound is about half the size it was last week. It is clean with just a little perimeter eschar and light slough. The wound on his first metatarsal head is about the same with heavy callus overlying it. The medial thigh wound is closed again. He does have some skin changes on the top of his foot that looks potentially yeast related. 02/22/2022: The skin on the top of his foot improved with the use of a topical antifungal. The lateral leg wound continues to contract and is again smaller this week. There is a little bit of slough and eschar on the surface. The first metatarsal head wound is a little bit smaller but has reaccumulated a thick callus  over the top. He decided to try to trim his toenail and ultimately took the entire nail off of his left great toe. 03/02/2022: His lateral leg wound continues to improve, as does the wound on his left great toe. Unfortunately, it appears that somehow his foot got wet and moisture seeped in through the opening causing his skin to lift. There is a large wound now overlying his first metatarsal on both the plantar, medial, and dorsal portion of his foot. There is necrotic tissue and slough present underneath the shaggy macerated skin. 03/08/2022: The lateral leg wound is smaller again today. There is just a light layer of slough and eschar on the surface. The great toe wound is smaller again today. The first metatarsal wound is a little bit smaller today and does not look nearly as necrotic and macerated. There is still slough and nonviable tissue present. 03/15/2022: The lateral leg wound is narrower and just has a little bit of light slough buildup. The first metatarsal wound still has a fair amount of moisture affecting the periwound skin. The great toe wound is healed. 03/22/2022: The lateral leg wound is now isolated to just at the level of his knee. There is some eschar and slough accumulation. The first metatarsal head wound has epithelialized tremendously and is about half the size that it was last week. He still has some maceration on the top of his  foot and a fungal odor is present. 03/29/2022: T oday the patient's foot was macerated, suggesting that the cast got wet. The patient has also been picking at his dry skin and has enlarged the wound on his left lateral leg. In the time between having his cast removed and my evaluation, he had picked more dry skin and opened up additional wounds on his Achilles area and dorsal foot. The plantar first metatarsal head wound, however, is smaller and clean with just macerated callus around the perimeter and light slough on the surface. The lateral leg wound  measured a little bit larger but is also fairly clean with eschar and minimal slough. 04/02/2022: The patient had vascular studies done last Friday and so his cast was not applied. He is here today to have that done. 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 04/05/2022: Both wounds are smaller and quite clean. There is just a little biofilm on the lateral leg wound. 10/20; the patient has a wound on the left lateral surgical incision at the level of his lateral knee this looks clean and improved. He is using silver alginate. He also has an area on his left medial foot for which she is using Hydrofera Blue under a total contact cast both wounds are measuring smaller 04/20/2022: The plantar foot wound has contracted considerably and is very close to closing. The lateral leg wound was measured a little larger, but there was a tiny open area that was included in the measurements that was not included last week. He has some eschar around the perimeter but otherwise the wound looks clean. 04/27/2022: The lateral leg wound looks better this week. He says that midweek, he felt it was very dry and began applying hydrogel to the site. I think this was beneficial. The foot wound is nearly closed underneath a thick layer of dry skin and callus. 05/04/2022: The foot wound is healed. He has developed a new small ulcer on his anterior tibial surface about midway up his leg. It has a little slough on the surface. The lateral leg wound still is fairly dry, but clean with just a little biofilm on the surface. 05/11/2022: The wound on his foot reopened on Wednesday. A large blister formed which then broke open revealing the fat layer underneath. The ulcer on his anterior tibial surface is a little bit larger this week. The lateral leg wound has much better moisture balance this week. Fortunately, prior to his foot wound reopening, he did get the cast made for his orthotic. YAMEEN, BROBERG ([id number])  [id number] Page 10 of 25 05/15/2022: Already, the left medial foot wound has improved. The tissue is less macerated and the surface is clean. The ulcer on his anterior tibial surface continues to enlarge. This seems likely secondary to accumulated moisture. The lateral leg wound continues to have an improved moisture balance with the use of collagen. 05/25/2022: The medial foot wound continues to contract. It is now substantially smaller with just a little slough on the surface. The anterior tibial surface wound continues to enlarge further. Once again, this seems to be secondary to moisture. The lateral leg wound does not seem to be changing much in size, but the moisture balance is better. 06/01/2022: The anterior tibial wound is closed. The medial foot wound is down to just a very small, couple of millimeters, opening. The lateral leg wound has good moisture balance, but remains unchanged in size. 12/15; the patient's anterior tibial wound has reopened, however the  area on his right first metatarsal head is closed. The major wound is actually on the superior part of his surgical wound in the left lateral thigh. Not a completely viable surface under illumination. This may at some point require a debridement I think he is currently using Prisma. As noted the left medial foot wound has closed 06/14/2022: The anterior tibial wound has closed. The lateral leg wound has a better surface but is basically unchanged in size. The left medial foot wound has reopened. It looks as though there was some callus accumulation and moisture got under the callus which caused the tissue to break down again. 06/21/2022: A new wound has opened up just distal to the previous anterior tibial wound. It is small but has hypertrophic granulation tissue present. The lateral leg wound is a little bit narrower and has a layer of slough on the surface. The left medial foot wound is down to just a  pinhole. His custom orthotics should be available next week. 06/28/2022: The wound on his first metatarsal head has healed. He has developed a new small wound on his medial lower leg, in an old scar site. The lateral leg wound continues to contract but continues to accumulate slough, as well. 07/03/2022: Despite wearing his custom orthopedic shoes, he managed to reopen the wound on his first metatarsal head. He says he thinks his foot got wet and then some skin lifted up and he peeled this away. Both of the lower leg wounds are smaller and have some dry eschar on the surface. The lateral leg wound is quite a bit narrower today. 07/12/2022: The medial lower leg wound is closed. The anterior lower leg wound has contracted considerably. The lateral upper leg wound is narrower with a layer of slough on the surface. The first metatarsal head wound is also smaller, but had copious drainage which saturated the foam border dressing and resulted in some periwound tissue maceration. Fortunately there was no breakdown at this site. 07/19/2022: The lower leg shows signs of significant maceration; I think he must be sweating excessively inside his cast. There are several areas of skin breakdown present. The wound on his foot is smaller and that on his lateral leg is narrower and is shorter by about a centimeter. 07/26/2022: Last week we used a zinc Coflex wrap prior to applying his total contact cast and this has had the effect of keeping his skin from getting macerated this week. The anterior leg wound has epithelialized substantially. The lateral leg wound is significantly smaller with just a bit of slough on the surface. The first metatarsal head wound is also smaller this week. 08/02/2022: The anterior leg wound was closed on arrival, but while he was sitting in the room, he picked it open again. The lateral leg wound is smaller with just a little slough on the surface and the first metatarsal head wound has contracted  further, as well. 08/09/2022: The first metatarsal head wound is covered with callus. Underneath the callus, it is nearly completely closed. The lateral leg wound is smaller again this week. The anterior leg wound looks better, but he has such heavy buildup of old skin, that moisture is getting underneath this, becoming trapped, and causing the underlying skin to get macerated and open up. 08/16/2022: The first metatarsal head wound is closed. The lateral leg wound continues to contract and is quite a bit smaller again this week. There is just a small, superficial opening remaining on his anterior tibial surface. 08/23/2022: The  first metatarsal head wound has, by some miracle, remained closed. The lateral leg wound is substantially smaller with multiple areas of epithelialization. The anterior tibial surface wound is also quite a bit smaller and very clean. 08/30/2022: Unfortunately, his first metatarsal head wound opened up again. It happened in the same fashion as it has on prior occasions. Moisture got under dried skin/callus and created a wound when he removed his sock, taking the skin with it. The anterior tibial surface has a thick shell of hyperkeratotic skin. This has been contributing to ongoing repeat wounding events as moisture gets underneath this and causes tissue breakdown. 3/15; patient presents for follow-up. His anterior left leg wound has healed. He still has the wound to the left lateral aspect and left first met head. We have been using silver alginate and endoform to these areas under [employer]. He has no issues or complaints today. He has been taking Augmentin and reports improvement to his symptoms to the left first met head. 09/13/2022: He has accumulated more thick dry skin in sheets on his lower leg. The lateral leg wound is about the same size and the left first metatarsal head wound is a little bit smaller. There is slough on both surfaces. There is callus buildup around the  foot wound. 09/20/2022: The lateral leg wound is a little bit narrower and the left first metatarsal head wound also seems to have contracted slightly. There is slough on both surfaces. He has a little skin breakdown on his anterior tibial surface. 09/27/2022: The lateral leg wound continues to contract and is quite clean. The first metatarsal head wound is also smaller. There is some perimeter callus and slough accumulation on the foot. The anterior tibial surface is closed. 10/04/2022: Both of his wounds are smaller today, particularly the first metatarsal head wound. 10/18/2022: He missed his appointment last week and ended up cutting off his wrap on Saturday. The anterior tibial wound reopened. It is fairly superficial with a little bit of slough on the surface. His lateral leg wound is smaller with some slough and eschar buildup. The first metatarsal head wound is also smaller with some callus and slough accumulation. 10/25/2022: All wounds are smaller. There is slough and eschar on the lateral leg and slough and callus on the plantar foot wound. The anterior tibial wound is clean and flush with the surrounding skin. No debris accumulation here. 11/01/2022: The wounds are all smaller again this week. There is slough on the lateral leg and some minimal slough and eschar on the anterior tibial wound. There is callus accumulation on the first metatarsal head site, along with slough. There is also a yeasty odor coming from the foot. 11/08/2022: The lateral leg wound is smaller except where he picked some skin while waiting to be seen. There is a little bit of slough on the surface. The anterior tibial wound is closed. The first metatarsal head site has gotten macerated once again and has a lot of spongy wet tissue and callus around it. No yeast odor today. 11/15/2022: The lateral leg wound continues to contract. There is now a band of epithelium dividing it into 2 areas. Minimal slough accumulation. He managed  to pick open a new wound on his medial lower leg. The first metatarsal head site is smaller with some callus accumulation but no tissue maceration. 11/22/2022: The lateral leg wound is smaller again by about a third today. There is a little bit of slough on the surface with some periwound eschar.  The new wound that he picked open last week has healed but he picked open 3 new small wounds on his anterior tibial surface, once again due to his picking at his skin. LARKIN, KOTILA ([id number]) [id number] Page 11 of 25 The first metatarsal head wound looks about the same. There has been some callus accumulation and there is more edema present, as he was not put in a compression wrap last week. 11/29/2022: The lateral leg wound is smaller again today. There is a little periwound eschar and some slough present. The wounds on his anterior tibial surface are all closed except for 1 that has a little bit of slough on the open portion with eschar covering it. The first metatarsal head wound measured a little bit smaller today, but mostly looks about the same. Edema control is better. 12/06/2022: The anterior tibial wound is closed. The lateral leg wound is smaller with some slough and eschar accumulation. The plantar foot ulcer is about the same size, but the tissue does show some evidence of the fact that he was on his feet quite a bit more this past week; there was a death in his family. There has been more callus accumulation and the tissues are little bit more purpleish. 12/13/2022: He picked the skin on his anterior tibia and reopened a wound there while he was waiting to be seen in clinic. The lateral leg wound is much smaller with a little bit of slough and eschar. The plantar foot ulcer is smaller today with callus and slough buildup, but without the pressure induced tissue injury that was seen last week. 12/20/2022: The anterior tibial wound has healed. The lateral leg wound is  smaller again this week. There is a little bit of eschar buildup on the surface. The foot had a fairly strong odor coming from it that persisted even after washing. The wound itself looks like its gotten a little smaller but he has built up thick callus, once again. 12/26/2022: The lateral leg wound is down to just a narrow superficial slit. There is slough and a little bit of dry skin present. The foot is in much better shape today. There is less callus accumulation and no odor. The skin edges are starting to roll inward, however. 01/03/2023: The lateral leg wound is healed. The skin is quite dry but the wound has closed. The foot continues to contract. He has a fair amount of periwound callus accumulation and the surface of the is a little drier than ideal. 01/10/2023: The large lateral leg wound remains closed. He managed to pick off some of his dry skin and has multiple small open superficial wounds on his lower leg. The foot wound measures smaller, but he has a blister immediately adjacent to it. 01/17/2023: The large lateral leg wound remains closed. The small superficial wounds on his lower leg have also closed. The foot wound is about the same and the blister area immediately adjacent to it has dark eschar on the surface. The foot wound looks a bit dry. 01/24/2023: He picked at some dry skin on his lateral leg wound and reopened. The surface is dry and fibrotic. The foot wound is slightly smaller but has periwound callus and the edges are rolling inward again. 01/31/2023: His lateral leg wound is larger again today. It appears that the Ace bandage may be rubbing and causing some tissue breakdown. His foot wound is also a bit larger and the tissue does not appear particularly healthy. 02/07/2023: The lateral leg wound  improved quite significantly over the last week. He has been applying additional gauze over the site before applying the Ace bandage and this seems to have minimized the rubbing. The foot  wound is about the same. The culture that I took last week returned with a polymicrobial population of predominantly skin flora. He is currently taking Augmentin as recommended by the culture data. 02/14/2023: The lateral leg wound is covered in a layer of eschar. Underneath, the wound is nearly healed again. The foot wound looks better today. The depth around the edges has decreased. The tissue has a healthier appearance. He is still taking Augmentin. 02/21/2023: The lateral leg wound is smaller with a layer of eschar on the surface, but is not quite healed. The foot wound is stable but the depth around the edges is almost completely obliterated. There is some callus and senescent skin around the edges. 03/07/2023: The lateral leg wound is down to just a pinhole with a little bit of eschar on the surface. The foot wound demonstrates evidence that he has been walking excessively the past week and there is more callus with moisture underneath it, but the breakdown is limited to the callus layer and there is still intact skin underneath. 03/14/2023: The lateral leg wound is closed. The foot wound looks much improved this week. There is very minimal callus accumulation around the wound edges and the tissue surface appears healthier. It seems the adjustments made to his shoe by the [facility] were effective. 03/28/2023: The lateral leg wound has reopened. The patient reports that he was not moisturizing it as much as he probably should have been and it cracked and some skin peeled away. The foot wound shows signs of pressure-related trauma with some bruising and increased callus. The patient admits to extensive walking this past week while taking his wife to medical appointments. He does have a knee scooter and it is unclear why he does not use it. 04/04/2023: The lateral leg wound is larger again today. The patient reports that he thinks maybe some dry skin pulled off when he was changing his dressing. It  is quite dry and he states that he is using just water to moisten his endoform, rather than the hydrogel that was recommended. The foot wound is the same size, but the tissue is in better condition, without substantial bruising or friction-related trauma. 04/11/2023: Both wounds look better this week. Much of the lateral leg wound has epithelialized. The moisture balance is much better. The foot wound looks better than it has in months. The granulation tissue is flush with the surrounding skin and there is not much callus accumulation. 04/25/2023: The patient was out of town last week. The lateral leg ulcer has good moisture balance, but has opened up a little bit more at the most proximal aspect. The foot ulcer has a layer of callus around the outside but is otherwise fairly clean with minimal slough buildup. 05/02/2023: He has 2 new wounds today. He has picked at the skin on his left anterior tibial surface and opened up the wound location. He picked a callus on his medial foot adjacent to his existing metatarsal head wound and opened up the site there. The lateral leg wound looks a little bit better this week and the moisture balance is excellent. The metatarsal head wound has a little bit of breakdown at the more proximal end but otherwise is also making improvement. 05/09/2023: Both foot ulcers are smaller and look better today. The lower leg  ulcer is also smaller. The lateral leg ulcer is basically the same size. All of the wounds have slough on the surface and there is some callus accumulation around the plantar first metatarsal head wound. 05/16/2023: The smaller of the foot ulcers is nearly healed. The plantar foot ulcer is stable. The lower leg ulcer is smaller. The lateral leg ulcer is a little bit smaller and the moisture balance is good. There is slough on all of the wound surfaces. 06/13/2023: The small, more dorsal foot wounds have epithelialized. The plantar foot ulcer is stable in size,  but the tissue appears healthier. The lower leg ulcer is about the same size. The lateral leg ulcer is larger today. He reports that he had it nearly healed and the skin peeled off again as it got dry. 06/20/2023: The smaller more dorsal foot wounds are open again, but they are tiny. The plantar foot ulcer is stable, but the surface is looking a little bit dry. The lower leg ulcer is unchanged. The lateral leg ulcer is a little bit smaller today, but he has an area just distal to the wound where he says some tape pulled off, resulting in a wound. No concern for infection at any of the locations. 06/27/2023: The small dorsal foot wounds are closed again. The plantar foot ulcer is a little bit bigger today with more callus around the edges and senescent skin at the margins. It is clean without any significant odor. The anterior tibial wound is measuring slightly larger today, but is otherwise unchanged. The thigh ulcer measured slightly smaller today. There is dry skin around the edges and slough on the surface. 07/11/2023: He has 3 new wounds that have opened. He says that his compression stocking bunched up at his ankle and when he took it off, he noticed a wound at his ankle and 1 on his proximal dorsal foot. He also picked at some skin on his distal dorsal foot, opening of the wound in this location as well. The existing 3 wounds are stable. There is some slough on each of the wound surfaces. He has accumulated callus around the plantar foot wound. He says that he FAITH, BETSILL ([id number]) [id number] Page 12 of 25 is getting new specialty shoes this week and does not want to go into a cast until after he receives them. Electronic Signature(s) Signed: 07/11/2023 10:54:47 AM By: [name] MD FACS Entered By: [name] on 07/11/2023 10:54:46 -------------------------------------------------------------------------------- Physical Exam Details Patient Name:  Date of Service: [name]. 07/11/2023 10:00 A M Medical Record Number: [id number] Patient Account Number: [id number] Date of Birth/Sex: Treating RN: 1951-05-31 (73 y.o. M) Primary Care Provider: [name] Other Clinician: Referring Provider: Treating Provider/Extender: [name] in Treatment: 117 Constitutional . . . . no acute distress. Respiratory Normal work of breathing on room air.. Notes 07/11/2023: He has 3 new wounds that have opened. He says that his compression stocking bunched up at his ankle and when he took it off, he noticed a wound at his ankle and 1 on his proximal dorsal foot. He also picked at some skin on his distal dorsal foot, opening of the wound in this location as well. The existing 3 wounds are stable. There is some slough on each of the wound surfaces. He has accumulated callus around the plantar foot wound. Electronic Signature(s) Signed: 07/11/2023 10:55:28 AM By: [name] MD FACS Entered By: [name] on 07/11/2023 10:55:28 -------------------------------------------------------------------------------- Physician Orders Details Patient Name:  Date of Service: SHAYA, MACHON 07/11/2023 10:00 A M Medical Record Number: [id number] Patient Account Number: [id number] Date of Birth/Sex: Treating RN: 1950-06-29 (73 y.o. Dianna Limbo Primary Care Provider: [name] Other Clinician: Referring Provider: Treating Provider/Extender: [name] in Treatment: [id number] Verbal / Phone Orders: No Diagnosis Coding Follow-up Appointments ppointment in 1 week. - Dr. Lady Gary 07/18/23 at 10am Return A Anesthetic (In clinic) Topical Lidocaine 4% applied to wound bed Bathing/ Shower/ Hygiene May shower and wash wound with soap and water. Edema Control - Orders / Instructions Elevate legs to the level of the heart or above for 30 minutes daily and/or when sitting for 3-4 times a day throughout the  day. Avoid standing for long periods of time. Patient to wear own compression stockings every day. Moisturize legs daily. Off-Loading Wound #18R Left,Plantar Metatarsal head first LEMARR, POCH ([id number]) [phone].pdf Page 13 of 25 Other: - custom diabetic shoes Additional Orders / Instructions Follow Nutritious Diet - vitamin C 500 mg 3 times a day and zinc 30-50 mg a day Wound Treatment Wound #18R - Metatarsal head first Wound Laterality: Plantar, Left Cleanser: Normal Saline 1 x Per Day/30 Days Discharge Instructions: Cleanse the wound with Normal Saline prior to applying a clean dressing using gauze sponges, not tissue or cotton balls. Cleanser: Soap and Water 1 x Per Day/30 Days Discharge Instructions: May shower and wash wound with dial antibacterial soap and water prior to dressing change. Cleanser: Wound Cleanser 1 x Per Day/30 Days Discharge Instructions: Cleanse the wound with wound cleanser prior to applying a clean dressing using gauze sponges, not tissue or cotton balls. Topical: Skintegrity Hydrogel 4 (oz) 1 x Per Day/30 Days Discharge Instructions: Apply hydrogel as directed Prim Dressing: Promogran Prisma Matrix, 4.34 (sq in) (silver collagen) 1 x Per Day/30 Days ary Discharge Instructions: Moisten collagen with saline or hydrogel Secondary Dressing: Optifoam Non-Adhesive Dressing, 4x4 in (Generic) 1 x Per Day/30 Days Discharge Instructions: Apply over primary dressing as directed. Secondary Dressing: Woven Gauze Sponges 2x2 in (Generic) 1 x Per Day/30 Days Discharge Instructions: Apply over primary dressing as directed. Secured With: 38M Medipore H Soft Cloth Surgical T ape, 4 x 10 (in/yd) 1 x Per Day/30 Days Discharge Instructions: Secure with tape as directed. Wound #22R - Lower Leg Wound Laterality: Left, Lateral, Proximal Cleanser: Soap and Water 1 x Per Day/30 Days Discharge Instructions: May shower and wash wound with dial  antibacterial soap and water prior to dressing change. Cleanser: Wound Cleanser 1 x Per Day/30 Days Discharge Instructions: Cleanse the wound with wound cleanser prior to applying a clean dressing using gauze sponges, not tissue or cotton balls. Topical: Skintegrity Hydrogel 4 (oz) 1 x Per Day/30 Days Discharge Instructions: Apply hydrogel as directed Prim Dressing: Promogran Prisma Matrix, 4.34 (sq in) (silver collagen) 1 x Per Day/30 Days ary Discharge Instructions: Moisten collagen with saline or hydrogel Secondary Dressing: Woven Gauze Sponge, Non-Sterile 4x4 in 1 x Per Day/30 Days Discharge Instructions: Apply over primary dressing as directed. Secured With: 38M Medipore H Soft Cloth Surgical T ape, 4 x 10 (in/yd) 1 x Per Day/30 Days Discharge Instructions: Secure with tape as directed. Wound #36 - Lower Leg Wound Laterality: Left, Anterior Cleanser: Soap and Water 1 x Per Day/30 Days Discharge Instructions: May shower and wash wound with dial antibacterial soap and water prior to dressing change. Cleanser: Wound Cleanser 1 x Per Day/30 Days Discharge Instructions: Cleanse the wound with wound cleanser prior to applying a  clean dressing using gauze sponges, not tissue or cotton balls. Prim Dressing: Maxorb Extra Ag+ Alginate Dressing, 2x2 (in/in) 1 x Per Day/30 Days ary Discharge Instructions: Apply to wound bed as instructed Secondary Dressing: Zetuvit Plus Silicone Border Dressing 4x4 (in/in) 1 x Per Day/30 Days Discharge Instructions: Apply silicone border over primary dressing as directed. Wound #38 - Lower Leg Wound Laterality: Left, Anterior, Distal Cleanser: Soap and Water 1 x Per Day/30 Days Discharge Instructions: May shower and wash wound with dial antibacterial soap and water prior to dressing change. Cleanser: Wound Cleanser 1 x Per Day/30 Days Discharge Instructions: Cleanse the wound with wound cleanser prior to applying a clean dressing using gauze sponges, not tissue or  cotton balls. Topical: Skintegrity Hydrogel 4 (oz) 1 x Per Day/30 Days Discharge Instructions: Apply hydrogel as directed Prim Dressing: Promogran Prisma Matrix, 4.34 (sq in) (silver collagen) 1 x Per Day/30 Days ary Discharge Instructions: Moisten collagen with saline or hydrogel BANNON, MIDYETTE ([id number]) [id number] Page 14 of 25 Secondary Dressing: Woven Gauze Sponge, Non-Sterile 4x4 in 1 x Per Day/30 Days Discharge Instructions: Apply over primary dressing as directed. Secured With: 56M Medipore H Soft Cloth Surgical T ape, 4 x 10 (in/yd) 1 x Per Day/30 Days Discharge Instructions: Secure with tape as directed. Wound #39 - Foot Wound Laterality: Dorsal, Left, Proximal Cleanser: Soap and Water 1 x Per Day/30 Days Discharge Instructions: May shower and wash wound with dial antibacterial soap and water prior to dressing change. Cleanser: Wound Cleanser 1 x Per Day/30 Days Discharge Instructions: Cleanse the wound with wound cleanser prior to applying a clean dressing using gauze sponges, not tissue or cotton balls. Topical: Skintegrity Hydrogel 4 (oz) 1 x Per Day/30 Days Discharge Instructions: Apply hydrogel as directed Prim Dressing: Promogran Prisma Matrix, 4.34 (sq in) (silver collagen) 1 x Per Day/30 Days ary Discharge Instructions: Moisten collagen with saline or hydrogel Secondary Dressing: Woven Gauze Sponge, Non-Sterile 4x4 in 1 x Per Day/30 Days Discharge Instructions: Apply over primary dressing as directed. Secured With: 56M Medipore H Soft Cloth Surgical T ape, 4 x 10 (in/yd) 1 x Per Day/30 Days Discharge Instructions: Secure with tape as directed. Wound #40 - Foot Wound Laterality: Dorsal, Left, Distal Cleanser: Soap and Water 1 x Per Day/30 Days Discharge Instructions: May shower and wash wound with dial antibacterial soap and water prior to dressing change. Cleanser: Wound Cleanser 1 x Per Day/30 Days Discharge Instructions: Cleanse the wound  with wound cleanser prior to applying a clean dressing using gauze sponges, not tissue or cotton balls. Topical: Skintegrity Hydrogel 4 (oz) 1 x Per Day/30 Days Discharge Instructions: Apply hydrogel as directed Prim Dressing: Promogran Prisma Matrix, 4.34 (sq in) (silver collagen) 1 x Per Day/30 Days ary Discharge Instructions: Moisten collagen with saline or hydrogel Secondary Dressing: Woven Gauze Sponge, Non-Sterile 4x4 in 1 x Per Day/30 Days Discharge Instructions: Apply over primary dressing as directed. Secured With: 56M Medipore H Soft Cloth Surgical T ape, 4 x 10 (in/yd) 1 x Per Day/30 Days Discharge Instructions: Secure with tape as directed. Electronic Signature(s) Signed: 07/11/2023 11:10:22 AM By: [name] MD FACS Entered By: [name] on 07/11/2023 10:55:56 -------------------------------------------------------------------------------- Problem List Details Patient Name: Date of Service: [name]. 07/11/2023 10:00 A M Medical Record Number: [id number] Patient Account Number: [id number] Date of Birth/Sex: Treating RN: Jun 20, 1951 (73 y.o. M) Primary Care Provider: [name] Other Clinician: Referring Provider: Treating Provider/Extender: [name] in Treatment: [phone] Active Problems ICD-10 Encounter  Code Description Active Date MDM Diagnosis L97.528 Non-pressure chronic ulcer of other part of left foot with other specified 08/26/2022 No Yes severity BALIN, FIUMARA ([id number]) [phone].pdf Page 15 of 25 L97.128 Non-pressure chronic ulcer of left thigh with other specified severity 03/28/2023 No Yes L97.828 Non-pressure chronic ulcer of other part of left lower leg with other specified 05/02/2023 No Yes severity I89.0 Lymphedema, not elsewhere classified 04/12/2021 No Yes I87.322 Chronic venous hypertension (idiopathic) with inflammation of left lower 04/12/2021 No Yes extremity L85.9 Epidermal  thickening, unspecified 08/30/2022 No Yes E11.51 Type 2 diabetes mellitus with diabetic peripheral angiopathy without gangrene 04/12/2021 No Yes Inactive Problems ICD-10 Code Description Active Date Inactive Date E11.621 Type 2 diabetes mellitus with foot ulcer 04/12/2021 04/12/2021 E11.42 Type 2 diabetes mellitus with diabetic polyneuropathy 04/12/2021 04/12/2021 L02.416 Cutaneous abscess of left lower limb 06/13/2021 06/13/2021 Resolved Problems ICD-10 Code Description Active Date Resolved Date L97.828 Non-pressure chronic ulcer of other part of left lower leg with other specified severity 01/10/2023 06/08/2022 Electronic Signature(s) Signed: 07/11/2023 10:50:10 AM By: [name] MD FACS Entered By: [name] on 07/11/2023 10:50:10 -------------------------------------------------------------------------------- Progress Note Details Patient Name: Date of Service: [name]. 07/11/2023 10:00 A M Medical Record Number: [id number] Patient Account Number: [id number] Date of Birth/Sex: Treating RN: 1950/11/19 (73 y.o. M) Primary Care Provider: [name] Other Clinician: Referring Provider: Treating Provider/Extender: [name] in Treatment: [address], [place] ([id number]) [id number] Page 16 of 25 Chief Complaint Information obtained from Patient Left leg and foot ulcers 04/12/2021; patient is here for wounds on his left lower leg and left plantar foot over the first metatarsal head History of Present Illness (HPI) 10/11/17; Mr. Colmenares is a 73 year old man who tells me that in 2015 he slipped down the latter traumatizing his left leg. He developed a wound in the same spot the area that we are currently looking at. He states this closed over for the most part although he always felt it was somewhat unstable. In 2016 he hit the same area with the door of his car had this reopened. He tells me that this is never  really closed although sometimes an inflow it remains open on a constant basis. He has not been using any specific dressing to this except for topical antibiotics the nature of which were not really sure. His primary doctor did send him to see Dr. Jacinto Halim of interventional cardiology. He underwent an angiogram on 08/06/17 and he underwent a PTA and directional atherectomy of the lesser distal SFA and popliteal arteries which resulted in brisk improvement in blood flow. It was noted that he had 2 vessel runoff through the anterior tibial and peroneal. He is also been to see vascular and interventional radiologist. He was not felt to have any significant superficial venous insufficiency. Presumably is not a candidate for any ablation. It was suggested he come here for wound care. The patient is a type II diabetic on insulin. He also has a history of venous insufficiency. ABIs on the left were noncompressible in our clinic 10/21/17; patient we admitted to the clinic last week. He has a fairly large chronic ulcer on the left lateral calf in the setting of chronic venous insufficiency. We put Iodosorb on him after an aggressive debridement and 3 layer compression. He complained of pain in his ankle and itching with is skin in fact he scratched the area on the medial calf superiorly at the rim of our wraps and he has 2 small  open areas in that location today which are new. I changed his primary dressing today to silver collagen. As noted he is already had revascularization and does not have any significant superficial venous insufficiency that would be amenable to ablation 10/28/17; patient admitted to the clinic 2 weeks ago. He has a smaller Wound. Scratch injury from last week revealed. There is large wound over the tibial area. This is smaller. Granulation looks healthy. No need for debridement. 11/04/17; the wound on the left lateral calf looks better. Improved dimensions. Surface of this looks better. We've  been maintaining him and Kerlix Coban wraps. He finds this much more comfortable. Silver collagen dressing 11/11/17; left lateral Wound continues to look healthy be making progress. Using a #5 curet I removed removed nonviable skin from the surface of the wound and then necrotic debris from the wound surface. Surface of the wound continues to look healthy. He also has an open area on the left great toenail bed. We've been using topical antibiotics. 11/19/17; left anterior lateral wound continues to look healthy but it's not closed. He also had a small wound above this on the left leg Initially traumatic wounds in the setting of significant chronic venous insufficiency and stasis dermatitis 11/25/17; left anterior wounds superiorly is closed still a small wound inferiorly. 12/02/17; left anterior tibial area. Arrives today with adherent callus. Post debridement clearly not completely closed. Hydrofera Blue under 3 layer compression. 12/09/17; left anterior tibia. Circumferential eschar however the wound bed looks stable to improved. We've been using Hydrofera Blue under 3 layer compression 12/17/17; left anterior tibia. Apparently this was felt to be closed however when the wrap was taken off there is a skin tear to reopen wounds in the same area we've been using Hydrofera Blue under 3 layer compression 12/23/17 left anterior tibia. Not close to close this week apparently the [facility] was stuck to this again. Still circumferential eschar requiring debridement. I put a contact layer on this this time under the Hydrofera Blue 12/31/17; left anterior tibia. Wound is better slight amount of hyper-granulation. Using Hydrofera Blue over Adaptic. 01/07/18; left anterior tibia. The wound had some surface eschar however after this was removed he has no open wound.he was already revascularized by Dr. Jacinto Halim when he came to our clinic with atherectomy of the left SFA and popliteal artery. He was also sent to  interventional radiology for venous reflux studies. He was not felt to have significant reflux but certainly has chronic venous changes of his skin with hemosiderin deposition around this area. He will definitely need to lubricate his skin and wear compression stocking and I've talked to him about this. READMISSION 05/26/2018 This is a now 73 year old man we cared for with traumatic wounds on his left anterior lower extremity. He had been previously revascularized during that admission by Dr. Jacinto Halim. Apparently in follow-up Dr. Jacinto Halim noted that he had deterioration in his arterial status. He underwent a stent placement in the distal left SFA on 04/22/2018. 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 He went back to the angiography suite on 04/30/2018 he underwent PTA and balloon angioplasty of the occluded left mid anterior tibial artery, thrombotic occlusion went from 100 to 0% which reconstitutes the posterior tibial artery. He had thrombectomy and aspiration of the peroneal artery. The stent placed in the distal SFA left SFA was still occluded. He was discharged on Xarelto, it was noted on the discharge summary from this hospitalization that he had gangrene at the tip of his  left fifth toe and there were expectations this would auto amputate. 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 He has been recuperating at [facility] in [facility] after the most recent hospitalization. He is going home tomorrow. He tells me that 2 weeks ago he traumatized the tip of his left fifth toe. He came in urgently for our review of this. This was a history of before I noted that Dr. Jacinto Halim had already noted dry gangrenous changes of the left fifth toe 06/09/2018; 2-week follow-up. I did contact Dr. Jacinto Halim after his last appointment and he apparently saw 1 of Dr. Verl Dicker colleagues the next day. He does not follow-up with Dr. Jacinto Halim himself until  Thursday of this week. 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 Nevertheless there is no evidence of infection no drainage and no pain. He had a new area that this week when we were signing him in today on the left anterior mid tibia area, this is in close proximity to the previous wound we have dealt with in this clinic. 06/23/2018; 2-week follow-up. I did not receive a recent note from Dr. Jacinto Halim to review today. Our office is trying to obtain this. He is apparently not planning to do further vascular interventions and wondered about compression to try and help with the patient's chronic venous insufficiency. However we are also concerned about the arterial flow. He arrives in clinic today with a new area on the left third toe. The areas on the calf/anterior tibia are close to closing. The left fifth toe is still mummified using Betadine. -In reviewing things with the patient he has what sounds like claudication with mild to moderate amount of activity. 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 I prescribed Levaquin over the phone while we attempted to arrange a plan of care. However the patient called yesterday to report he had low-grade fever and he came in today acutely. There is been a marked deterioration in the left third toe with spreading cellulitis up into the dorsal left foot. He was referred to the emergency room. Readmission: 06/29/2020 patient presents today for reevaluation here in our clinic he was previously treated by Dr. Leanord Hawking at the latter part of 2019 in 2 the beginning of 2020. Subsequently we have not seen him since that time in the interim he did have evaluation with vein and vascular specialist specifically Dr. Bo Mcclintock who did perform quite extensive work for a left femoral to anterior tibial artery bypass. With that being said in the interim the patient has developed significant lymphedema and has wounds that he tells  me have really never healed in regard to the incision site on the left leg. He also has multiple wounds on the feet for various reasons some of which is that he tends to pick at his feet. Fortunately there is no signs of active infection systemically at this time he does have some wounds that are little bit deeper but most are fairly superficial he seems to have good blood flow and overall everything appears to be healthy I see no bone exposed and no obvious signs of osteomyelitis. I do not know that he necessarily needs a x-ray at this point although that something we could consider depending on how things progress. The patient does have a history of lymphedema, diabetes, this is type II, chronic kidney disease stage III, hypertension,  and history of peripheral vascular disease. 07/05/2020; patient admitted last week. Is a patient I remember from 2019 he had a spreading infection involving the left foot and we sent him to the hospital. 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 He subsequently had a left femoral to anterior tibial bypass by Dr.Cain vein KAYNE, COULTAS ([id number]) [phone].pdf Page 17 of 25 and vascular. He also has severe lymphedema with chronic skin changes related to that on the left leg. The most problematic area that was new today was on the left medial great toe. This was apparently a small area last week there was purulent drainage which our intake nurse cultured. Also areas on the left medial foot and heel left lateral foot. He has 2 areas on the left medial calf left lateral calf in the setting of the severe lymphedema. 07/13/2020 on evaluation today patient appears to be doing better in my opinion compared to his last visit. The good news is there is no signs of active infection systemically and locally I do not see any signs of infection either. He did have an x-ray which was negative that is great news he had a  culture which showed MRSA but at the same time he is been on the doxycycline which has helped. I do think we may want to extend this for 7 additional days 1/25; patient admitted to the clinic a few weeks ago. 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 We have been putting him under compression his edema control is a lot better but he is severe verricused skin on the left leg. He is really done quite well he still has an open area on the left medial calf and the left medial first metatarsal head. We have been using silver collagen on the leg silver alginate on the foot 07/27/2020 upon evaluation today patient appears to be doing decently well in regard to his wounds. He still has a lot of dry skin on the left leg. Some of this is starting to peel back and I think he may be able to have them out by removing some that today. Fortunately there is no signs of active infection at this time on the left leg although on the right leg he does appear to have swelling and erythema as well as some mild warmth to touch. This does have been concerned about the possibility of cellulitis although within the differential diagnosis I do think that potentially a DVT has to be at least considered. We need to rule that out before proceeding would just call in the cellulitis. Especially since he is having pain in the posterior aspect of his calf muscle. 2/8; the patient had seen sparingly. He has severe skin changes of chronic lymphedema in the left leg thickened hyperkeratotic verrucous skin. He has an open wound on the medial part of the left first met head left mid tibia. He also has a rim of nonepithelialized skin in the anterior mid tibia. He brought in the AmLactin lotion that was been prescribed although I am not sure under compression and its utility. There concern about cellulitis on the right lower leg the last time he was here. He was put on on antibiotics. His DVT rule  out was negative. The right leg looks fine he is using his stocking on this area 08/10/2020 upon evaluation today patient appears to be doing well with regard to his leg  currently. He has been tolerating the dressing changes without complication. Fortunately there is no signs of active infection which is great news. Overall very pleased with where things stand. 2/22; the patient still has an area on the medial part of the left first met his head. This looks better than when I last saw this earlier this month he has a rim of epithelialization but still some surface debris. Mostly everything on the left leg is healed. There is still a vulnerable in the left mid tibia area. 08/30/2020 upon evaluation today patient appears to be doing much better in regard to his wounds on his foot. 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 Nonetheless I think we will address that today I Minna send in a prescription for him in that regard. Overall though there does not appear to be any signs of significant worsening. 09/07/2020 on evaluation today patient's wounds over his left foot appear to be doing excellent. I do not see any signs of infection there is some callus buildup this can require debridement for certain but overall I feel like he is managing quite nicely. He still using the AmLactin cream which has been beneficial for him as well. 3/22; left foot wound is closed. There is no open area here. He is using ammonium lactate lotion to the lower extremities to help exfoliate dry cracked skin. He has compression stockings from elastic therapy in [place]. The wound on the medial part of his left first met head is healed today. READMISSION 04/12/2021 Mr. Strom is a patient we know fairly well he had a prolonged stay in clinic in 2019 with wounds on his left lateral and left anterior  lower extremity in the setting of chronic venous insufficiency. More recently he was here earlier this year with predominantly an area on his left foot first metatarsal head plantar and he says the plantar foot broke down on its not long after we discharged him but he did not come back here. The last few months areas of broken down on his left anterior and again the left lateral lower extremity. The leg itself is very swollen chronically enlarged a lot of hyperkeratotic dry Berry Q skin in the left lower leg. His edema extends well into the thigh. He was seen by Dr. Randie Heinz. He had ABIs on 03/02/2021 showing an ABI on the right of 1 with a TBI of 0.72 his ABI in the left at 1.09 TBI of 0.99. Monophasic and biphasic waveforms on the right. On the left monophasic waveforms were noted he went on to have an angiogram on 03/27/2021 this showed the aortic aortic and iliac segments were free of flow-limiting stenosis the left common femoral vein to evaluate the left femoral to anterior tibial artery bypass was unobstructed the bypass was patent without any areas of stenosis. We discharged the patient in bilateral juxta lite stockings but very clearly that was not sufficient to control the swelling and maintain skin integrity. He is clearly going to need compression pumps. 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 This is fortunate because he is on his feet for long periods of time. 10/27; patient comes in with our intake nurse reporting copious amount of green drainage from the left anterior mid tibia the left dorsal foot and to  a lesser extent the left medial mid tibia. We left the compression wrap on all week for the amount of edema in his left leg is quite a bit better. We use silver alginate as the primary dressing 11/3; edema control is good. Left anterior lower leg left medial lower leg and the plantar first metatarsal head. The left anterior lower leg  required debridement. Deep tissue culture I did of this wound showed MRSA I put him on 10 days of doxycycline which she will start today. We have him in compression wraps. He has a security card and [employer] however he is retiring on November 15. We will need to then get him into a better offloading boot for the left foot perhaps a total contact cast 11/10; edema control is quite good. Left anterior and left medial lower leg wounds in the setting of chronic venous insufficiency and lymphedema. He also has a substantial area over the left plantar first metatarsal head. 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 He has significant hypergranulation on the left plantar foot wound. The patient is a diabetic but he does not have significant PAD 11/17; edema control is quite good. Left anterior and left medial lower leg wounds look better. The really concerning area remains the area on the left plantar first metatarsal head. He has a rim of epithelialization. 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 Starting today with a forefoot off loader but . possibly a total contact cast. 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 Arrives with a very significant odor from I think all of the wounds on the left leg and the left foot. Because of this I did not put a total contact cast on him today but will could still consider this. His wife was having cataract surgery which is the reason he missed the appointment 12/6. I saw this man 5 days ago with a swelling below the popliteal fossa. I thought he actually might have a Baker's cyst however the DVT rule out study that we could arrange right away was negative  the technician told me this was not a ruptured Baker's cyst. We attempted to get this aspirated by under ultrasound guidance in interventional radiology however all they did was an ultrasound however it shows an extensive fluid collection 62 x 8 x 9.4 in the left thigh and left calf. The patient states he thinks this started 8 days ago or so but he really is not complaining of any pain, fever or systemic symptoms. He has not ha 12/20; after some difficulty I managed to get the patient into see Dr. Randie Heinz. Eventually he was taken into the hospital and had a drain put in the fluid collection below his left knee posteriorly extending into the posterior thigh. He still has the drain in place. Culture of this showed moderate staff aureus few Morganella and few Klebsiella he is now on doxycycline and ciprofloxacin as suggested by infectious disease he is on this for a month. The drain will remain in place until it stops draining 12/29; he comes in today with the 1 wound on his left leg and the area on the  left plantar first met head significantly smaller. Both look healthy. TAVARUS, MADRIAGA ([id number]) [id number] Page 18 of 25 He still has the drain in the left leg. He says he has to change this daily. Follows up with Dr. Randie Heinz on January 11. 06/29/2021; the wounds that I am following on the left leg and left first met head continued to be quite healthy. However the area where his inferior drain is in place had copious amounts of drainage which was green in color. The wound here is larger. Follows up with Dr. Pascal Lux of vein and vascular his surgeon next week as well as infectious disease. He remains on ciprofloxacin and doxycycline. He is not complaining of excessive pain in either one of the drain areas 1/12; the patient saw vascular surgery and infectious disease. Vascular surgery has left the drain in place as there was still some notable drainage still see him back in 2 weeks. Dr.  Dorthula Perfect stop the doxycycline and ciprofloxacin and I do not believe he follows up with them at this point. Culture I did last week showed both doxycycline resistant MRSA and Pseudomonas not sensitive to ciprofloxacin although only in rare titers 1/19; the patient's wound on the left anterior lower leg is just about healed. We have continued healing of the area that was medially on the left leg. Left first plantar metatarsal head continues to get smaller. The major problem here is his 2 drain sites 1 on the left upper calf and lateral thigh. There is purulent drainage still from the left lateral thigh. I gave him antibiotics last week but we still have recultured. He has the drain in the area I think this is eventually going to have to come out. I suspect there will be a connecting wound to heal here perhaps with improved VAc 1/26; the patient had his drain removed by vein and vascular on 1/25/. This was a large pocket of fluid in his left thigh that seem to tunnel into his left upper calf. 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 His mention his Penrose drain was removed today. He now has a tunneling wound on his left calf and left thigh. Both of these probe widely towards each other although I cannot really prove that they connect. Both wounds on his lower leg anteriorly are closed and his area over the first metatarsal head on his right foot continues to improve. We are using Hydrofera Blue here. He also saw infectious disease culture of the abscess they noted was polymicrobial with MRSA, Morganella and Klebsiella he was treated with doxycycline and ciprofloxacin for 4 weeks ending on 07/03/2021. They did not recommend any further antibiotics. Notable that while he still had the Penrose drain in place last week he had purulent drainage coming out of the inferior IandD site this grew [facility], MRSA and Pseudomonas but there does not appear to be any active infection in this area  today with the drain out and he is not systemically unwell 2/2; with regards to the drain sites the superior one on the thigh actually is closed down the one on the upper left lateral calf measures about 8 and half centimeters which is an improvement seems to be less prominent although still with a lot of drainage. The only remaining wound is over the first metatarsal head on the left foot and this looks to be continuing to improve with Hydrofera Blue. 2/9; the area on his plantar left foot continues to contract. Callus around  the wound edge. The drain sites specifically have not come down in depth. We put the wound VAC on Monday he changed the canister late last night our intake nurse reported a pocket of fluid perhaps caused by our compression wraps 2/16; continued improvement in left foot plantar wound. drainage site in the calf is not improved in terms of depth (wound vac) 2/23; continued improvement in the left foot wound over the first metatarsal head. With regards to the drain sites the area on his thigh laterally is healed however the open area on his calf is small in terms of circumference by still probes in by about 15 cm. Within using the wound VAC. Hydrofera Blue on his foot 08/24/2021: The left first metatarsal head wound continues to improve. The wound bed is healthy with just some surrounding callus. Unfortunately the open drain site on his calf remains open and tunnels at least 15 cm (the extent of a Q-tip). This is despite several weeks of wound VAC treatment. Based on reading back through the notes, there has been really no significant change in the depth of the wound, although the orifice is smaller and the more cranial wound on his thigh has closed. I suspect the tunnel tracks nearly all the way to this location. 08/31/2021: Continued improvement in the left first metatarsal head wound. There has been absolutely no improvement to the long tunnel from his open drain site on his calf.  We have tried to get him into see vascular surgery sooner to consider the possibility of simply filleting the tract open and allowing it to heal from the bottom up, likely with a wound VAC. They have not yet scheduled a sooner appointment than his current mid April 09/14/2021: He was seen by vascular surgery and they took him to the operating room last week. They opened a portion of the tunnel, but did not extend the entire length of the known open subcutaneous tract. I read Dr. Darcella Cheshire operative note and it is not clear from that documentation why only a portion of the tract was opened. The heaped up granulation tissue was curetted and removed from at least some portion of the tract. They did place a wound VAC and applied an Unna boot to the leg. The ulcer on his left first metatarsal head is smaller today. The bed looks good and there is just a small amount of surrounding callus. 09/21/2021: The ulcer on his left first metatarsal head looks to be stalled. There is some callus surrounding the wound but the wound bed itself does not appear particularly dynamic. The tunnel tract on his lateral left leg seems to be roughly the same length or perhaps slightly smaller but the wound bed appears healthy with good granulation tissue. He opened up a new wound on his medial thigh and the site of a prior surgical incision. He says that he did this unconsciously in his sleep by scratching. 09/28/2021: Unfortunately, the ulcer on his left first metatarsal head has extended underneath the callus toward the dorsum of his foot. The medial thigh wounds are roughly the same. The tunnel on his lateral left leg continues to be problematic; it is longer than we are able to actually probe with a Q-tip. I am still not certain as to why Dr. Randie Heinz did not open this up entirely when he took the patient to the operating room. We will likely be back in the same situation with just a small superficial opening in a long unhealed tract,  as  the open portion is granulating in nicely. 10/02/2021: The patient was initially scheduled for a nurse visit, but we are also applying a total contact cast today. The plantar foot wound looks clean without significant accumulated callus. We have been applying Prisma silver collagen to the site. 10/05/2021: The patient is here for his first total contact cast change. We have tried using gauze packing strips in the tunnel on his lateral leg wound, but this does not seem to be working any better than the white VAC foam. The foot ulcer looks about the same with minimal periwound callus. Medial thigh wound is clean with just some overlying eschar. 10/12/2021: The plantar foot wound is stable without any significant accumulation of periwound callus. The surface is viable with good granulation tissue. The medial thigh wounds are much smaller and are epithelializing. On the other hand, he had purulent drainage coming from the tunnel on his lateral leg. He does go back to see Dr. Randie Heinz next week and is planning to ask him why the wound tunnel was not completely opened at the time of his most recent operation. 10/19/2021: The plantar foot wound is markedly improved and has epithelial tissue coming through the surface. The medial thigh wounds are nearly closed with just a tiny open area. He did see Dr. Randie Heinz earlier this week and apparently they did discuss the possibility of opening the sinus tract further and enabling a wound VAC application. Apparently there are some limits as to what Dr. 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 I think if we could get the tract open to the level of the popliteal fossa, this would greatly aid in her ability to get this chart closed. That being said, however, today when I probed the tract with a Q-tip, I was not able to insert the entirety of the Q-tip as I have on previous occasions. The tunnel is shorter by about 4 cm. The surface is clean  with good granulation tissue and no further episodes of purulent drainage. 10/30/2021: Last week, the patient underwent surgery and had the long tract in his leg opened. There was a rind that was debrided, according to the operative report. His medial thigh ulcers are closed. The plantar foot wound is clean with a good surface and some built up surrounding callus. 11/06/2021: The overall dimensions of the large wound on his lateral leg remain about the same, but there is good granulation tissue present and the tunneling is a little bit shorter. He has a new wound on his anterior tibial surface, in the same location where he had a similar lesion in the past. The plantar foot wound is clean with some buildup surrounding callus. Just toward the medial aspect of his foot, however, there is an area of darkening that once debrided, revealed another opening in the skin surface. 11/13/2021: The anterior tibial surface wound is closed. The plantar foot wound has some surrounding callus buildup. The area of darkening that I debrided last week and revealed an opening in the skin surface has closed again. The tunnel in the large wound on his lateral leg has come in by about 3 cm. There is FRUTOSO, MUSSELMAN ([id number]) [id number] Page 19 of 25 healthy granulation tissue on the entire wound surface. 11/23/2021: The patient was out of town last week and did wet-to-dry dressings on his large wound. 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 Unfortunately, he picked  open the wound on his left medial thigh. He says that it was itching and he just could not stop scratching it until it was open again. The wound on his plantar foot is smaller and has not accumulated a tremendous amount of callus. The lateral leg wound is shallower and the tunnel has also decreased in depth. There is just a little bit of slough accumulation on the  surface. 11/30/2021: Another portion of his left medial thigh has been opened up. All of these wounds are fairly superficial with just a little bit of slough and eschar accumulation. The wound on his plantar foot is almost closed with just a bit of eschar and periwound callus accumulation. The lateral leg wound is nearly flush with the surrounding skin and the tunnel is markedly shallower. 12/07/2021: There is just 1 open area on his left medial thigh. It is clean with just a little bit of perimeter eschar. The wound on his plantar foot continues to contract and just has some eschar and periwound callus accumulation. The lateral leg wound is closing at the more distal aspect and the tunnel is smaller. The surface is nearly flush with the surrounding skin and it has a good bed of granulation tissue. 12/14/2021: The thigh and foot wounds are closed. The lateral leg wound has closed over approximately half of its length. The tunnel continues to contract and the surface is now flush with the surrounding skin. The wound bed has robust granulation tissue. 12/22/2021: The thigh and foot wounds have reopened. The foot wound has a lot of callus accumulation around and over it. The thigh wound is tiny with just a little bit of slough in the wound bed. The lateral leg wound continues to contract. His vascular surgeon took the wound VAC off earlier in the week and the patient has been doing wet-to-dry dressings. There is a little slough accumulation on the surface. The tunnel is about 3 cm in depth at this point. 12/28/2021: The thigh wound is closed again. The foot wound has some callus that subsequently has peeled back exposing just a small slit of a wound. The lateral leg wound Is down to about half the size that it originally was and the tunnel is down to about half a centimeter in depth. 01/04/2022: The thigh wound remains closed. The foot wound has heavy callus overlying the wound site. Once this was debrided, the  wound was found to be closed. The lateral leg wound is smaller again this week and very superficial. No tunnel could be identified. 01/12/2022: The thigh and foot wounds both remain closed. The lateral leg wound is now nearly flush with the skin surface. There is good granulation tissue present with a light layer of slough. 01/19/2022: Due to the way his wrap was placed, the patient did not change the dressing on his thigh at all and so the foam was saturated and his skin is macerated. There is a light layer of slough on the wound surface. The underlying granulation tissue is robust and healthy-appearing. He has heavy callus buildup at the site of his first metatarsal head wound which is still healed. 02/01/2022: He has been in silver alginate. When he removed the dressing from his thigh wound, however, some leg, superficially reopening a portion of the wound that had healed. In addition, underneath the callus at his left first metatarsal head, there appears to be a blister and the wound appears to be open again. 02/08/2022: The lateral leg wound has contracted substantially. There  is eschar and a light layer of slough present. He says that it is starting to pull and is uncomfortable. On inspection, there is some puckering of the scar and the eschar is quite dry; this may account for his symptoms. On his first metatarsal head, the wound is much smaller with just some eschar on the surface. The callus has not reaccumulated. He reports that he had a blister come up on his medial thigh wound at the distal aspect. It popped and there is now an opening in his skin again. Looking back through his library of wound photos, there is what looks like a permanent suture just deep to this location and it may be trying to erode through. We have been using silver alginate on his wounds. 02/15/2022: The lateral leg wound is about half the size it was last week. It is clean with just a little perimeter eschar and light  slough. The wound on his first metatarsal head is about the same with heavy callus overlying it. The medial thigh wound is closed again. He does have some skin changes on the top of his foot that looks potentially yeast related. 02/22/2022: The skin on the top of his foot improved with the use of a topical antifungal. The lateral leg wound continues to contract and is again smaller this week. There is a little bit of slough and eschar on the surface. The first metatarsal head wound is a little bit smaller but has reaccumulated a thick callus over the top. He decided to try to trim his toenail and ultimately took the entire nail off of his left great toe. 03/02/2022: His lateral leg wound continues to improve, as does the wound on his left great toe. Unfortunately, it appears that somehow his foot got wet and moisture seeped in through the opening causing his skin to lift. There is a large wound now overlying his first metatarsal on both the plantar, medial, and dorsal portion of his foot. There is necrotic tissue and slough present underneath the shaggy macerated skin. 03/08/2022: The lateral leg wound is smaller again today. There is just a light layer of slough and eschar on the surface. The great toe wound is smaller again today. The first metatarsal wound is a little bit smaller today and does not look nearly as necrotic and macerated. There is still slough and nonviable tissue present. 03/15/2022: The lateral leg wound is narrower and just has a little bit of light slough buildup. The first metatarsal wound still has a fair amount of moisture affecting the periwound skin. The great toe wound is healed. 03/22/2022: The lateral leg wound is now isolated to just at the level of his knee. There is some eschar and slough accumulation. The first metatarsal head wound has epithelialized tremendously and is about half the size that it was last week. He still has some maceration on the top of his foot and a  fungal odor is present. 03/29/2022: T oday the patient's foot was macerated, suggesting that the cast got wet. The patient has also been picking at his dry skin and has enlarged the wound on his left lateral leg. In the time between having his cast removed and my evaluation, he had picked more dry skin and opened up additional wounds on his Achilles area and dorsal foot. The plantar first metatarsal head wound, however, is smaller and clean with just macerated callus around the perimeter and light slough on the surface. The lateral leg wound measured a little bit  larger but is also fairly clean with eschar and minimal slough. 04/02/2022: The patient had vascular studies done last Friday and so his cast was not applied. He is here today to have that done. 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 04/05/2022: Both wounds are smaller and quite clean. There is just a little biofilm on the lateral leg wound. 10/20; the patient has a wound on the left lateral surgical incision at the level of his lateral knee this looks clean and improved. He is using silver alginate. He also has an area on his left medial foot for which she is using Hydrofera Blue under a total contact cast both wounds are measuring smaller 04/20/2022: The plantar foot wound has contracted considerably and is very close to closing. The lateral leg wound was measured a little larger, but there was a tiny open area that was included in the measurements that was not included last week. He has some eschar around the perimeter but otherwise the wound looks clean. 04/27/2022: The lateral leg wound looks better this week. He says that midweek, he felt it was very dry and began applying hydrogel to the site. I think this was beneficial. The foot wound is nearly closed underneath a thick layer of dry skin and callus. 05/04/2022: The foot wound is healed. He has developed a new small ulcer on his anterior tibial surface about midway up his  leg. It has a little slough on the surface. The lateral leg wound still is fairly dry, but clean with just a little biofilm on the surface. 05/11/2022: The wound on his foot reopened on Wednesday. A large blister formed which then broke open revealing the fat layer underneath. The ulcer on his anterior tibial surface is a little bit larger this week. The lateral leg wound has much better moisture balance this week. Fortunately, prior to his foot wound ASSAD, MUHAMMED ([id number]) [id number] Page 20 of 25 reopening, he did get the cast made for his orthotic. 05/15/2022: Already, the left medial foot wound has improved. The tissue is less macerated and the surface is clean. The ulcer on his anterior tibial surface continues to enlarge. This seems likely secondary to accumulated moisture. The lateral leg wound continues to have an improved moisture balance with the use of collagen. 05/25/2022: The medial foot wound continues to contract. It is now substantially smaller with just a little slough on the surface. The anterior tibial surface wound continues to enlarge further. Once again, this seems to be secondary to moisture. The lateral leg wound does not seem to be changing much in size, but the moisture balance is better. 06/01/2022: The anterior tibial wound is closed. The medial foot wound is down to just a very small, couple of millimeters, opening. The lateral leg wound has good moisture balance, but remains unchanged in size. 12/15; the patient's anterior tibial wound has reopened, however the area on his right first metatarsal head is closed. The major wound is actually on the superior part of his surgical wound in the left lateral thigh. Not a completely viable surface under illumination. This may at some point require a debridement I think he is currently using Prisma. As noted the left medial foot wound has closed 06/14/2022: The anterior tibial wound has closed. The  lateral leg wound has a better surface but is basically unchanged in size. The left medial foot wound has reopened. It looks as though there was some callus accumulation and moisture got under the callus which  caused the tissue to break down again. 06/21/2022: A new wound has opened up just distal to the previous anterior tibial wound. It is small but has hypertrophic granulation tissue present. The lateral leg wound is a little bit narrower and has a layer of slough on the surface. The left medial foot wound is down to just a pinhole. His custom orthotics should be available next week. 06/28/2022: The wound on his first metatarsal head has healed. He has developed a new small wound on his medial lower leg, in an old scar site. The lateral leg wound continues to contract but continues to accumulate slough, as well. 07/03/2022: Despite wearing his custom orthopedic shoes, he managed to reopen the wound on his first metatarsal head. He says he thinks his foot got wet and then some skin lifted up and he peeled this away. Both of the lower leg wounds are smaller and have some dry eschar on the surface. The lateral leg wound is quite a bit narrower today. 07/12/2022: The medial lower leg wound is closed. The anterior lower leg wound has contracted considerably. The lateral upper leg wound is narrower with a layer of slough on the surface. The first metatarsal head wound is also smaller, but had copious drainage which saturated the foam border dressing and resulted in some periwound tissue maceration. Fortunately there was no breakdown at this site. 07/19/2022: The lower leg shows signs of significant maceration; I think he must be sweating excessively inside his cast. There are several areas of skin breakdown present. The wound on his foot is smaller and that on his lateral leg is narrower and is shorter by about a centimeter. 07/26/2022: Last week we used a zinc Coflex wrap prior to applying his total contact  cast and this has had the effect of keeping his skin from getting macerated this week. The anterior leg wound has epithelialized substantially. The lateral leg wound is significantly smaller with just a bit of slough on the surface. The first metatarsal head wound is also smaller this week. 08/02/2022: The anterior leg wound was closed on arrival, but while he was sitting in the room, he picked it open again. The lateral leg wound is smaller with just a little slough on the surface and the first metatarsal head wound has contracted further, as well. 08/09/2022: The first metatarsal head wound is covered with callus. Underneath the callus, it is nearly completely closed. The lateral leg wound is smaller again this week. The anterior leg wound looks better, but he has such heavy buildup of old skin, that moisture is getting underneath this, becoming trapped, and causing the underlying skin to get macerated and open up. 08/16/2022: The first metatarsal head wound is closed. The lateral leg wound continues to contract and is quite a bit smaller again this week. There is just a small, superficial opening remaining on his anterior tibial surface. 08/23/2022: The first metatarsal head wound has, by some miracle, remained closed. The lateral leg wound is substantially smaller with multiple areas of epithelialization. The anterior tibial surface wound is also quite a bit smaller and very clean. 08/30/2022: Unfortunately, his first metatarsal head wound opened up again. It happened in the same fashion as it has on prior occasions. Moisture got under dried skin/callus and created a wound when he removed his sock, taking the skin with it. The anterior tibial surface has a thick shell of hyperkeratotic skin. This has been contributing to ongoing repeat wounding events as moisture gets underneath this and  causes tissue breakdown. 3/15; patient presents for follow-up. His anterior left leg wound has healed. He still has  the wound to the left lateral aspect and left first met head. We have been using silver alginate and endoform to these areas under [employer]. He has no issues or complaints today. He has been taking Augmentin and reports improvement to his symptoms to the left first met head. 09/13/2022: He has accumulated more thick dry skin in sheets on his lower leg. The lateral leg wound is about the same size and the left first metatarsal head wound is a little bit smaller. There is slough on both surfaces. There is callus buildup around the foot wound. 09/20/2022: The lateral leg wound is a little bit narrower and the left first metatarsal head wound also seems to have contracted slightly. There is slough on both surfaces. He has a little skin breakdown on his anterior tibial surface. 09/27/2022: The lateral leg wound continues to contract and is quite clean. The first metatarsal head wound is also smaller. There is some perimeter callus and slough accumulation on the foot. The anterior tibial surface is closed. 10/04/2022: Both of his wounds are smaller today, particularly the first metatarsal head wound. 10/18/2022: He missed his appointment last week and ended up cutting off his wrap on Saturday. The anterior tibial wound reopened. It is fairly superficial with a little bit of slough on the surface. His lateral leg wound is smaller with some slough and eschar buildup. The first metatarsal head wound is also smaller with some callus and slough accumulation. 10/25/2022: All wounds are smaller. There is slough and eschar on the lateral leg and slough and callus on the plantar foot wound. The anterior tibial wound is clean and flush with the surrounding skin. No debris accumulation here. 11/01/2022: The wounds are all smaller again this week. There is slough on the lateral leg and some minimal slough and eschar on the anterior tibial wound. There is callus accumulation on the first metatarsal head site, along with slough.  There is also a yeasty odor coming from the foot. 11/08/2022: The lateral leg wound is smaller except where he picked some skin while waiting to be seen. There is a little bit of slough on the surface. The anterior tibial wound is closed. The first metatarsal head site has gotten macerated once again and has a lot of spongy wet tissue and callus around it. No yeast odor today. 11/15/2022: The lateral leg wound continues to contract. There is now a band of epithelium dividing it into 2 areas. Minimal slough accumulation. He managed to pick open a new wound on his medial lower leg. The first metatarsal head site is smaller with some callus accumulation but no tissue maceration. DEZHON, HOLLENBACK ([id number]) [id number] Page 21 of 25 11/22/2022: The lateral leg wound is smaller again by about a third today. There is a little bit of slough on the surface with some periwound eschar. The new wound that he picked open last week has healed but he picked open 3 new small wounds on his anterior tibial surface, once again due to his picking at his skin. The first metatarsal head wound looks about the same. There has been some callus accumulation and there is more edema present, as he was not put in a compression wrap last week. 11/29/2022: The lateral leg wound is smaller again today. There is a little periwound eschar and some slough present. The wounds on his anterior tibial surface are all closed  except for 1 that has a little bit of slough on the open portion with eschar covering it. The first metatarsal head wound measured a little bit smaller today, but mostly looks about the same. Edema control is better. 12/06/2022: The anterior tibial wound is closed. The lateral leg wound is smaller with some slough and eschar accumulation. The plantar foot ulcer is about the same size, but the tissue does show some evidence of the fact that he was on his feet quite a bit more this past week; there  was a death in his family. There has been more callus accumulation and the tissues are little bit more purpleish. 12/13/2022: He picked the skin on his anterior tibia and reopened a wound there while he was waiting to be seen in clinic. The lateral leg wound is much smaller with a little bit of slough and eschar. The plantar foot ulcer is smaller today with callus and slough buildup, but without the pressure induced tissue injury that was seen last week. 12/20/2022: The anterior tibial wound has healed. The lateral leg wound is smaller again this week. There is a little bit of eschar buildup on the surface. The foot had a fairly strong odor coming from it that persisted even after washing. The wound itself looks like its gotten a little smaller but he has built up thick callus, once again. 12/26/2022: The lateral leg wound is down to just a narrow superficial slit. There is slough and a little bit of dry skin present. The foot is in much better shape today. There is less callus accumulation and no odor. The skin edges are starting to roll inward, however. 01/03/2023: The lateral leg wound is healed. The skin is quite dry but the wound has closed. The foot continues to contract. He has a fair amount of periwound callus accumulation and the surface of the is a little drier than ideal. 01/10/2023: The large lateral leg wound remains closed. He managed to pick off some of his dry skin and has multiple small open superficial wounds on his lower leg. The foot wound measures smaller, but he has a blister immediately adjacent to it. 01/17/2023: The large lateral leg wound remains closed. The small superficial wounds on his lower leg have also closed. The foot wound is about the same and the blister area immediately adjacent to it has dark eschar on the surface. The foot wound looks a bit dry. 01/24/2023: He picked at some dry skin on his lateral leg wound and reopened. The surface is dry and fibrotic. The foot wound  is slightly smaller but has periwound callus and the edges are rolling inward again. 01/31/2023: His lateral leg wound is larger again today. It appears that the Ace bandage may be rubbing and causing some tissue breakdown. His foot wound is also a bit larger and the tissue does not appear particularly healthy. 02/07/2023: The lateral leg wound improved quite significantly over the last week. He has been applying additional gauze over the site before applying the Ace bandage and this seems to have minimized the rubbing. The foot wound is about the same. The culture that I took last week returned with a polymicrobial population of predominantly skin flora. He is currently taking Augmentin as recommended by the culture data. 02/14/2023: The lateral leg wound is covered in a layer of eschar. Underneath, the wound is nearly healed again. The foot wound looks better today. The depth around the edges has decreased. The tissue has a healthier appearance. He  is still taking Augmentin. 02/21/2023: The lateral leg wound is smaller with a layer of eschar on the surface, but is not quite healed. The foot wound is stable but the depth around the edges is almost completely obliterated. There is some callus and senescent skin around the edges. 03/07/2023: The lateral leg wound is down to just a pinhole with a little bit of eschar on the surface. The foot wound demonstrates evidence that he has been walking excessively the past week and there is more callus with moisture underneath it, but the breakdown is limited to the callus layer and there is still intact skin underneath. 03/14/2023: The lateral leg wound is closed. The foot wound looks much improved this week. There is very minimal callus accumulation around the wound edges and the tissue surface appears healthier. It seems the adjustments made to his shoe by the [facility] were effective. 03/28/2023: The lateral leg wound has reopened. The patient reports that he  was not moisturizing it as much as he probably should have been and it cracked and some skin peeled away. The foot wound shows signs of pressure-related trauma with some bruising and increased callus. The patient admits to extensive walking this past week while taking his wife to medical appointments. He does have a knee scooter and it is unclear why he does not use it. 04/04/2023: The lateral leg wound is larger again today. The patient reports that he thinks maybe some dry skin pulled off when he was changing his dressing. It is quite dry and he states that he is using just water to moisten his endoform, rather than the hydrogel that was recommended. The foot wound is the same size, but the tissue is in better condition, without substantial bruising or friction-related trauma. 04/11/2023: Both wounds look better this week. Much of the lateral leg wound has epithelialized. The moisture balance is much better. The foot wound looks better than it has in months. The granulation tissue is flush with the surrounding skin and there is not much callus accumulation. 04/25/2023: The patient was out of town last week. The lateral leg ulcer has good moisture balance, but has opened up a little bit more at the most proximal aspect. The foot ulcer has a layer of callus around the outside but is otherwise fairly clean with minimal slough buildup. 05/02/2023: He has 2 new wounds today. He has picked at the skin on his left anterior tibial surface and opened up the wound location. He picked a callus on his medial foot adjacent to his existing metatarsal head wound and opened up the site there. The lateral leg wound looks a little bit better this week and the moisture balance is excellent. The metatarsal head wound has a little bit of breakdown at the more proximal end but otherwise is also making improvement. 05/09/2023: Both foot ulcers are smaller and look better today. The lower leg ulcer is also smaller. The  lateral leg ulcer is basically the same size. All of the wounds have slough on the surface and there is some callus accumulation around the plantar first metatarsal head wound. 05/16/2023: The smaller of the foot ulcers is nearly healed. The plantar foot ulcer is stable. The lower leg ulcer is smaller. The lateral leg ulcer is a little bit smaller and the moisture balance is good. There is slough on all of the wound surfaces. 06/13/2023: The small, more dorsal foot wounds have epithelialized. The plantar foot ulcer is stable in size, but the tissue appears  healthier. The lower leg ulcer is about the same size. The lateral leg ulcer is larger today. He reports that he had it nearly healed and the skin peeled off again as it got dry. 06/20/2023: The smaller more dorsal foot wounds are open again, but they are tiny. The plantar foot ulcer is stable, but the surface is looking a little bit dry. The lower leg ulcer is unchanged. The lateral leg ulcer is a little bit smaller today, but he has an area just distal to the wound where he says some tape pulled off, resulting in a wound. No concern for infection at any of the locations. 06/27/2023: The small dorsal foot wounds are closed again. The plantar foot ulcer is a little bit bigger today with more callus around the edges and senescent skin at the margins. It is clean without any significant odor. The anterior tibial wound is measuring slightly larger today, but is otherwise unchanged. The thigh ulcer measured slightly smaller today. There is dry skin around the edges and slough on the surface. 07/11/2023: He has 3 new wounds that have opened. He says that his compression stocking bunched up at his ankle and when he took it off, he noticed a AMARDEEP, ZERBA ([id number]) [id number] Page 22 of 25 wound at his ankle and 1 on his proximal dorsal foot. He also picked at some skin on his distal dorsal foot, opening of the wound in this  location as well. The existing 3 wounds are stable. There is some slough on each of the wound surfaces. He has accumulated callus around the plantar foot wound. He says that he is getting new specialty shoes this week and does not want to go into a cast until after he receives them. Objective Constitutional no acute distress. Vitals Time Taken: 9:57 AM, Height: 74 in, Weight: 238 lbs, BMI: 30.6, Temperature: 98.3 F, Pulse: 74 bpm, Respiratory Rate: 16 breaths/min, Blood Pressure: 115/70 mmHg. Respiratory Normal work of breathing on room air.. General Notes: 07/11/2023: He has 3 new wounds that have opened. He says that his compression stocking bunched up at his ankle and when he took it off, he noticed a wound at his ankle and 1 on his proximal dorsal foot. He also picked at some skin on his distal dorsal foot, opening of the wound in this location as well. The existing 3 wounds are stable. There is some slough on each of the wound surfaces. He has accumulated callus around the plantar foot wound. Integumentary (Hair, Skin) Wound #18R status is Open. Original cause of wound was Gradually Appeared. The date acquired was: 08/23/2020. The wound has been in treatment 117 weeks. The wound is located on the Left,Plantar Metatarsal head first. The wound measures 2.5cm length x 2.6cm width x 0.3cm depth; 5.105cm^2 area and 1.532cm^3 volume. There is Fat Layer (Subcutaneous Tissue) exposed. There is no tunneling noted, however, there is undermining starting at 6:00 and ending at 9:00 with a maximum distance of 0.3cm. There is a medium amount of serosanguineous drainage noted. The wound margin is epibole. There is large (67-100%) red granulation within the wound bed. There is a small (1-33%) amount of necrotic tissue within the wound bed including Adherent Slough. The periwound skin appearance had no abnormalities noted for color. The periwound skin appearance exhibited: Callus, Maceration. The periwound skin  appearance did not exhibit: Dry/Scaly. Periwound temperature was noted as No Abnormality. Wound #22R status is Open. Original cause of wound was Bump. The date acquired was: 06/03/2021. The  wound has been in treatment 109 weeks. The wound is located on the Left,Proximal,Lateral Lower Leg. The wound measures 1cm length x 0.7cm width x 0.1cm depth; 0.55cm^2 area and 0.055cm^3 volume. There is Fat Layer (Subcutaneous Tissue) exposed. There is no tunneling or undermining noted. There is a medium amount of serous drainage noted. The wound margin is distinct with the outline attached to the wound base. There is large (67-100%) red granulation within the wound bed. There is a small (1-33%) amount of necrotic tissue within the wound bed including Eschar and Adherent Slough. The periwound skin appearance had no abnormalities noted for color. The periwound skin appearance exhibited: Scarring, Dry/Scaly. The periwound skin appearance did not exhibit: Maceration. Periwound temperature was noted as No Abnormality. Wound #36 status is Open. Original cause of wound was Skin T ear/Laceration. The date acquired was: 05/02/2023. The wound has been in treatment 10 weeks. The wound is located on the Left,Anterior Lower Leg. The wound measures 1cm length x 0.5cm width x 0.1cm depth; 0.393cm^2 area and 0.039cm^3 volume. There is Fat Layer (Subcutaneous Tissue) exposed. There is no tunneling or undermining noted. There is a medium amount of serous drainage noted. The wound margin is distinct with the outline attached to the wound base. There is medium (34-66%) red, pink granulation within the wound bed. There is a medium (34-66%) amount of necrotic tissue within the wound bed including Adherent Slough. The periwound skin appearance had no abnormalities noted for texture. The periwound skin appearance had no abnormalities noted for moisture. The periwound skin appearance exhibited: Hemosiderin Staining. Periwound  temperature was noted as No Abnormality. Wound #38 status is Open. Original cause of wound was Shear/Friction. The date acquired was: 07/08/2023. The wound is located on the [facility] Lower Leg. The wound measures 0.5cm length x 1.5cm width x 0.1cm depth; 0.589cm^2 area and 0.059cm^3 volume. There is Fat Layer (Subcutaneous Tissue) exposed. There is no tunneling or undermining noted. There is a medium amount of serosanguineous drainage noted. The wound margin is distinct with the outline attached to the wound base. There is large (67-100%) red granulation within the wound bed. There is a small (1-33%) amount of necrotic tissue within the wound bed including Adherent Slough. The periwound skin appearance had no abnormalities noted for moisture. The periwound skin appearance exhibited: Scarring, Hemosiderin Staining. Periwound temperature was noted as No Abnormality. Wound #39 status is Open. Original cause of wound was Shear/Friction. The date acquired was: 07/08/2023. The wound is located on the Left,Proximal,Dorsal Foot. The wound measures 0.5cm length x 0.5cm width x 0.1cm depth; 0.196cm^2 area and 0.02cm^3 volume. There is Fat Layer (Subcutaneous Tissue) exposed. There is no tunneling or undermining noted. There is a medium amount of serosanguineous drainage noted. There is large (67-100%) red granulation within the wound bed. There is a small (1-33%) amount of necrotic tissue within the wound bed including Adherent Slough. The periwound skin appearance had no abnormalities noted for moisture. The periwound skin appearance had no abnormalities noted for color. The periwound skin appearance exhibited: Scarring. Periwound temperature was noted as No Abnormality. Wound #40 status is Open. Original cause of wound was Shear/Friction. The date acquired was: 07/08/2023. The wound is located on the Left,Distal,Dorsal Foot. The wound measures 0.6cm length x 0.3cm width x 0.1cm depth; 0.141cm^2 area  and 0.014cm^3 volume. There is Fat Layer (Subcutaneous Tissue) exposed. There is no tunneling or undermining noted. There is a medium amount of serosanguineous drainage noted. The wound margin is distinct with the outline attached  to the wound base. There is large (67-100%) red granulation within the wound bed. There is a small (1-33%) amount of necrotic tissue within the wound bed including Adherent Slough. The periwound skin appearance had no abnormalities noted for moisture. The periwound skin appearance had no abnormalities noted for color. The periwound skin appearance exhibited: Scarring. Periwound temperature was noted as No Abnormality. 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 There was a Excisional Skin/Subcutaneous Tissue Debridement with a total area of 6.38 sq cm performed by [name], MD. With the following instrument(s): Curette to remove Viable and Non-Viable tissue/material. Material removed includes Callus, Subcutaneous Tissue, and Slough. No specimens were taken. A time out was conducted at 10:20, prior to the start of the procedure. A Minimum amount of bleeding was controlled with Pressure. The procedure was  tolerated well. Post Debridement Measurements: 2.5cm length x 2.6cm width x 0.3cm depth; 1.532cm^3 volume. Character of Wound/Ulcer Post Debridement is improved. Severity of Tissue Post Debridement is: Fat layer exposed. Post procedure Diagnosis Wound #18R: Same as Pre-Procedure Wound #22R Pre-procedure diagnosis of Wound #22R is a Cyst located on the Left,Proximal,Lateral Lower Leg . There was a Excisional Skin/Subcutaneous Tissue Debridement with a total area of 0.55 sq cm performed by [name], MD. With the following instrument(s): Curette to remove Viable and Non-Viable tissue/material. Material removed includes Subcutaneous Tissue and Slough and after achieving pain control using Lidocaine 4% T opical Solution. No specimens were taken. A time out was conducted at 10:20, prior to the start of the procedure. A Minimum amount of bleeding was controlled with Pressure. The procedure was tolerated well. Post Debridement Measurements: 1cm length x 0.7cm width x 0.1cm depth; 0.055cm^3 volume. Character of Wound/Ulcer Post Debridement is improved. Post procedure Diagnosis Wound #22R: Same as Pre-Procedure Wound #36 Pre-procedure diagnosis of Wound #36 is a Diabetic Wound/Ulcer of the Lower Extremity located on the Left,Anterior Lower Leg .Severity of Tissue Pre Debridement is: Fat layer exposed. There was a Excisional Skin/Subcutaneous Tissue Debridement with a total area of 0.39 sq cm performed by [name], MD. With the following instrument(s): Curette to remove Viable and Non-Viable tissue/material. Material removed includes Subcutaneous Tissue and Slough and after achieving pain control using Lidocaine 4% T opical Solution. No specimens were taken. A time out was conducted at 10:20, prior to the start of the procedure. A Minimum amount of bleeding was controlled with Pressure. The procedure was tolerated well. Post Debridement Measurements: 1cm length x 0.5cm width x 0.1cm depth;  0.039cm^3 volume. Character of Wound/Ulcer Post Debridement is improved. Severity of Tissue Post Debridement is: Fat layer exposed. Post procedure Diagnosis Wound #36: Same as Pre-Procedure Wound #38 Pre-procedure diagnosis of Wound #38 is a Diabetic Wound/Ulcer of the Lower Extremity located on the Left,Distal,Anterior Lower Leg .Severity of Tissue Pre Debridement is: Fat layer exposed. There was a Excisional Skin/Subcutaneous Tissue Debridement with a total area of 0.59 sq  cm performed by [name], MD. With the following instrument(s): Curette to remove Viable and Non-Viable tissue/material. Material removed includes Subcutaneous Tissue and Slough and after achieving pain control using Lidocaine 4% T opical Solution. No specimens were taken. A time out was conducted at 10:20, prior to the start of the procedure. A Minimum amount of bleeding was controlled with Pressure. The procedure was tolerated well. Post Debridement Measurements: 0.5cm length x 1.5cm width x 0.1cm depth; 0.059cm^3 volume. Character of Wound/Ulcer Post Debridement is improved. Severity of Tissue Post Debridement is: Fat layer exposed. 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 There was a Excisional Skin/Subcutaneous Tissue Debridement with a total area of 0.2 sq cm performed by [name], MD. With the following instrument(s): Curette to remove Viable and Non-Viable tissue/material. Material removed includes Subcutaneous Tissue and Slough and. No specimens were taken. A time out was conducted at 10:20, prior to the start of the procedure. A Minimum amount of bleeding was controlled with Pressure. The procedure was tolerated well. Post Debridement Measurements: 0.5cm length x 0.5cm width x 0.1cm depth;  0.02cm^3 volume. Character of Wound/Ulcer Post Debridement is improved. Severity of Tissue Post Debridement is: Fat layer exposed. 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 There was a Excisional Skin/Subcutaneous Tissue Debridement with a total area of 0.14 sq cm performed by [name], MD. With the following instrument(s): Curette to remove Viable and Non-Viable tissue/material. Material removed includes Subcutaneous Tissue and Slough and after achieving pain control using Lidocaine 4% T opical Solution. No specimens were taken. A time out was conducted at 10:20, prior to the start of the procedure. A Minimum amount of bleeding was controlled with Pressure. The procedure was tolerated well. Post Debridement Measurements: 0.6cm length x 0.3cm width x 0.1cm depth; 0.014cm^3 volume. Character of Wound/Ulcer Post Debridement is improved. Severity of Tissue Post Debridement is: Fat layer exposed. Post procedure Diagnosis Wound #40: Same as Pre-Procedure Plan Follow-up Appointments: Return Appointment in 1 week. - Dr. Lady Gary 07/18/23 at 10am Anesthetic: (In clinic) Topical Lidocaine 4% applied to wound bed Bathing/ Shower/ Hygiene: May shower and wash wound with soap and water. Edema Control - Orders / Instructions: Elevate legs to the level of the heart or above for 30 minutes daily and/or when sitting for 3-4 times a day throughout the day. Avoid standing for long periods of time. Patient to wear own compression stockings every day. Moisturize legs daily. Off-Loading: Wound #18R Left,Plantar Metatarsal head first: HAYDAR, MERRETT ([id number]) [phone].pdf Page 24 of 25 Other: - custom diabetic shoes Additional Orders / Instructions: Follow Nutritious  Diet - vitamin C 500 mg 3 times a day and zinc 30-50 mg a day WOUND #18R: - Metatarsal head first Wound Laterality: Plantar, Left Cleanser: Normal Saline 1 x Per Day/30 Days Discharge Instructions: Cleanse the wound with Normal Saline prior to applying a clean dressing using gauze sponges, not tissue or cotton balls. Cleanser: Soap and Water 1 x Per Day/30 Days Discharge Instructions: May shower and wash wound with dial antibacterial soap and water prior to dressing change. Cleanser: Wound Cleanser 1 x Per Day/30 Days Discharge Instructions: Cleanse the wound with wound cleanser  prior to applying a clean dressing using gauze sponges, not tissue or cotton balls. Topical: Skintegrity Hydrogel 4 (oz) 1 x Per Day/30 Days Discharge Instructions: Apply hydrogel as directed Prim Dressing: Promogran Prisma Matrix, 4.34 (sq in) (silver collagen) 1 x Per Day/30 Days ary Discharge Instructions: Moisten collagen with saline or hydrogel Secondary Dressing: Optifoam Non-Adhesive Dressing, 4x4 in (Generic) 1 x Per Day/30 Days Discharge Instructions: Apply over primary dressing as directed. Secondary Dressing: Woven Gauze Sponges 2x2 in (Generic) 1 x Per Day/30 Days Discharge Instructions: Apply over primary dressing as directed. Secured With: 108M Medipore H Soft Cloth Surgical T ape, 4 x 10 (in/yd) 1 x Per Day/30 Days Discharge Instructions: Secure with tape as directed. WOUND #22R: - Lower Leg Wound Laterality: Left, Lateral, Proximal Cleanser: Soap and Water 1 x Per Day/30 Days Discharge Instructions: May shower and wash wound with dial antibacterial soap and water prior to dressing change. Cleanser: Wound Cleanser 1 x Per Day/30 Days Discharge Instructions: Cleanse the wound with wound cleanser prior to applying a clean dressing using gauze sponges, not tissue or cotton balls. Topical: Skintegrity Hydrogel 4 (oz) 1 x Per Day/30 Days Discharge Instructions: Apply hydrogel as directed Prim Dressing:  Promogran Prisma Matrix, 4.34 (sq in) (silver collagen) 1 x Per Day/30 Days ary Discharge Instructions: Moisten collagen with saline or hydrogel Secondary Dressing: Woven Gauze Sponge, Non-Sterile 4x4 in 1 x Per Day/30 Days Discharge Instructions: Apply over primary dressing as directed. Secured With: 108M Medipore H Soft Cloth Surgical T ape, 4 x 10 (in/yd) 1 x Per Day/30 Days Discharge Instructions: Secure with tape as directed. WOUND #36: - Lower Leg Wound Laterality: Left, Anterior Cleanser: Soap and Water 1 x Per Day/30 Days Discharge Instructions: May shower and wash wound with dial antibacterial soap and water prior to dressing change. Cleanser: Wound Cleanser 1 x Per Day/30 Days Discharge Instructions: Cleanse the wound with wound cleanser prior to applying a clean dressing using gauze sponges, not tissue or cotton balls. Prim Dressing: Maxorb Extra Ag+ Alginate Dressing, 2x2 (in/in) 1 x Per Day/30 Days ary Discharge Instructions: Apply to wound bed as instructed Secondary Dressing: Zetuvit Plus Silicone Border Dressing 4x4 (in/in) 1 x Per Day/30 Days Discharge Instructions: Apply silicone border over primary dressing as directed. WOUND #38: - Lower Leg Wound Laterality: Left, Anterior, Distal Cleanser: Soap and Water 1 x Per Day/30 Days Discharge Instructions: May shower and wash wound with dial antibacterial soap and water prior to dressing change. Cleanser: Wound Cleanser 1 x Per Day/30 Days Discharge Instructions: Cleanse the wound with wound cleanser prior to applying a clean dressing using gauze sponges, not tissue or cotton balls. Topical: Skintegrity Hydrogel 4 (oz) 1 x Per Day/30 Days Discharge Instructions: Apply hydrogel as directed Prim Dressing: Promogran Prisma Matrix, 4.34 (sq in) (silver collagen) 1 x Per Day/30 Days ary Discharge Instructions: Moisten collagen with saline or hydrogel Secondary Dressing: Woven Gauze Sponge, Non-Sterile 4x4 in 1 x Per Day/30  Days Discharge Instructions: Apply over primary dressing as directed. Secured With: 108M Medipore H Soft Cloth Surgical T ape, 4 x 10 (in/yd) 1 x Per Day/30 Days Discharge Instructions: Secure with tape as directed. WOUND #39: - Foot Wound Laterality: Dorsal, Left, Proximal Cleanser: Soap and Water 1 x Per Day/30 Days Discharge Instructions: May shower and wash wound with dial antibacterial soap and water prior to dressing change. Cleanser: Wound Cleanser 1 x Per Day/30 Days Discharge Instructions: Cleanse the wound with wound cleanser prior to applying a clean dressing using  gauze sponges, not tissue or cotton balls. Topical: Skintegrity Hydrogel 4 (oz) 1 x Per Day/30 Days Discharge Instructions: Apply hydrogel as directed Prim Dressing: Promogran Prisma Matrix, 4.34 (sq in) (silver collagen) 1 x Per Day/30 Days ary Discharge Instructions: Moisten collagen with saline or hydrogel Secondary Dressing: Woven Gauze Sponge, Non-Sterile 4x4 in 1 x Per Day/30 Days Discharge Instructions: Apply over primary dressing as directed. Secured With: 32M Medipore H Soft Cloth Surgical T ape, 4 x 10 (in/yd) 1 x Per Day/30 Days Discharge Instructions: Secure with tape as directed. WOUND #40: - Foot Wound Laterality: Dorsal, Left, Distal Cleanser: Soap and Water 1 x Per Day/30 Days Discharge Instructions: May shower and wash wound with dial antibacterial soap and water prior to dressing change. Cleanser: Wound Cleanser 1 x Per Day/30 Days Discharge Instructions: Cleanse the wound with wound cleanser prior to applying a clean dressing using gauze sponges, not tissue or cotton balls. Topical: Skintegrity Hydrogel 4 (oz) 1 x Per Day/30 Days Discharge Instructions: Apply hydrogel as directed Prim Dressing: Promogran Prisma Matrix, 4.34 (sq in) (silver collagen) 1 x Per Day/30 Days ary Discharge Instructions: Moisten collagen with saline or hydrogel Secondary Dressing: Woven Gauze Sponge, Non-Sterile 4x4 in 1 x  Per Day/30 Days Discharge Instructions: Apply over primary dressing as directed. Secured With: 32M Medipore H Soft Cloth Surgical T ape, 4 x 10 (in/yd) 1 x Per Day/30 Days Discharge Instructions: Secure with tape as directed. 07/11/2023: He has 3 new wounds that have opened. He says that his compression stocking bunched up at his ankle and when he took it off, he noticed a wound at his ankle and 1 on his proximal dorsal foot. He also picked at some skin on his distal dorsal foot, opening of the wound in this location as well. The existing 3 wounds are stable. There is some slough on each of the wound surfaces. He has accumulated callus around the plantar foot wound. He says that he is getting new specialty shoes this week and does not want to go into a cast until after he receives them. I used a curette to debride slough and subcutaneous tissue from each of his wounds, including the 3 new sites. I also debrided callus from around the plantar ESDRAS, MARCOE ([id number]) [phone].pdf Page 25 of 25 foot wound. I really think he will do better if we can get him into a cast so hopefully he will permit that next week. For now, we will continue Prisma silver collagen. He is still wearing his own compression stockings; he was cautioned to monitor them for bunching and be careful removing them so that he does not create even more wounds on his leg. Follow-up in 1 week's time. Electronic Signature(s) Signed: 07/11/2023 10:59:06 AM By: [name] MD FACS Entered By: [name] on 07/11/2023 10:59:05 -------------------------------------------------------------------------------- SuperBill Details Patient Name: Date of Service: [name]. 07/11/2023 Medical Record Number: [id number] Patient Account Number: [id number] Date of Birth/Sex: Treating RN: [id number] (72 y.o. 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 (idiopathic) with inflammation of left lower extremity L85.9 Epidermal thickening, unspecified E11.51  Type 2 diabetes mellitus with diabetic peripheral angiopathy without gangrene Facility Procedures : CPT4 Code: [id number] Description: 11042 - DEB SUBQ TISSUE 20 SQ CM/< ICD-10 Diagnosis Description L97.528 Non-pressure chronic ulcer of other part of left foot with other specified sever 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 L97.828  Non-pressure chronic ulcer of other part of left lower leg with other specified Modifier: ity severity Quantity: 1 Physician Procedures : CPT4 Code Description Modifier [id number] - WC PHYS LEVEL 4 - EST PT ICD-10 Diagnosis Description 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 L97.828 Non-pressure chronic ulcer of other part of left lower leg with other specified severity I89.0 Lymphedema, not elsewhere classified Quantity: 1 : [id number] 11042 - WC PHYS SUBQ TISS 20 SQ CM ICD-10 Diagnosis Description 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

## 2023-07-15 ENCOUNTER — Ambulatory Visit (INDEPENDENT_AMBULATORY_CARE_PROVIDER_SITE_OTHER): Payer: Medicare Other | Admitting: Nurse Practitioner

## 2023-07-15 ENCOUNTER — Encounter: Payer: Self-pay | Admitting: Nurse Practitioner

## 2023-07-15 VITALS — BP 128/79 | HR 61 | Ht 73.0 in | Wt 280.0 lb

## 2023-07-15 DIAGNOSIS — E782 Mixed hyperlipidemia: Secondary | ICD-10-CM | POA: Diagnosis not present

## 2023-07-15 DIAGNOSIS — Z794 Long term (current) use of insulin: Secondary | ICD-10-CM | POA: Diagnosis not present

## 2023-07-15 DIAGNOSIS — I10 Essential (primary) hypertension: Secondary | ICD-10-CM | POA: Diagnosis not present

## 2023-07-15 DIAGNOSIS — E1165 Type 2 diabetes mellitus with hyperglycemia: Secondary | ICD-10-CM

## 2023-07-15 LAB — POCT GLYCOSYLATED HEMOGLOBIN (HGB A1C): Hemoglobin A1C: 11.5 % — AB (ref 4.0–5.6)

## 2023-07-15 MED ORDER — DEXCOM G7 SENSOR MISC: 3 refills | Status: DC

## 2023-07-15 MED ORDER — INSULIN ASPART 100 UNIT/ML IJ SOLN: INTRAMUSCULAR | 3 refills | Status: DC

## 2023-07-15 MED ORDER — OMNIPOD 5 DEXG7G6 PODS GEN 5 MISC
6 refills | Status: DC
Start: 2023-07-15 — End: 2024-02-27

## 2023-07-15 MED ORDER — OZEMPIC (0.25 OR 0.5 MG/DOSE) 2 MG/3ML ~~LOC~~ SOPN: 0.5000 mg | PEN_INJECTOR | SUBCUTANEOUS | 1 refills | Status: DC
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
## 2023-07-18 ENCOUNTER — Encounter (HOSPITAL_BASED_OUTPATIENT_CLINIC_OR_DEPARTMENT_OTHER): Payer: Medicare Other | Admitting: General Surgery

## 2023-07-18 DIAGNOSIS — L97828 Non-pressure chronic ulcer of other part of left lower leg with other specified severity: Secondary | ICD-10-CM | POA: Diagnosis not present

## 2023-07-25 ENCOUNTER — Encounter (HOSPITAL_BASED_OUTPATIENT_CLINIC_OR_DEPARTMENT_OTHER): Payer: Medicare Other | Admitting: General Surgery

## 2023-07-25 DIAGNOSIS — L97828 Non-pressure chronic ulcer of other part of left lower leg with other specified severity: Secondary | ICD-10-CM | POA: Diagnosis not present

## 2023-08-01 ENCOUNTER — Encounter (HOSPITAL_BASED_OUTPATIENT_CLINIC_OR_DEPARTMENT_OTHER): Payer: Medicare Other | Attending: General Surgery | Admitting: General Surgery

## 2023-08-01 DIAGNOSIS — I87322 Chronic venous hypertension (idiopathic) with inflammation of left lower extremity: Secondary | ICD-10-CM | POA: Diagnosis not present

## 2023-08-01 DIAGNOSIS — E11621 Type 2 diabetes mellitus with foot ulcer: Secondary | ICD-10-CM | POA: Diagnosis present

## 2023-08-01 DIAGNOSIS — L97528 Non-pressure chronic ulcer of other part of left foot with other specified severity: Secondary | ICD-10-CM | POA: Insufficient documentation

## 2023-08-01 DIAGNOSIS — L859 Epidermal thickening, unspecified: Secondary | ICD-10-CM | POA: Diagnosis not present

## 2023-08-01 DIAGNOSIS — I890 Lymphedema, not elsewhere classified: Secondary | ICD-10-CM | POA: Insufficient documentation

## 2023-08-01 DIAGNOSIS — L97128 Non-pressure chronic ulcer of left thigh with other specified severity: Secondary | ICD-10-CM | POA: Diagnosis not present

## 2023-08-08 ENCOUNTER — Encounter (HOSPITAL_BASED_OUTPATIENT_CLINIC_OR_DEPARTMENT_OTHER): Payer: Medicare Other | Admitting: General Surgery

## 2023-08-08 DIAGNOSIS — E11621 Type 2 diabetes mellitus with foot ulcer: Secondary | ICD-10-CM | POA: Diagnosis not present

## 2023-08-15 ENCOUNTER — Encounter (HOSPITAL_BASED_OUTPATIENT_CLINIC_OR_DEPARTMENT_OTHER): Payer: Medicare Other | Admitting: General Surgery

## 2023-08-15 DIAGNOSIS — E11621 Type 2 diabetes mellitus with foot ulcer: Secondary | ICD-10-CM | POA: Diagnosis not present

## 2023-08-19 ENCOUNTER — Telehealth: Payer: Self-pay

## 2023-08-19 ENCOUNTER — Encounter: Payer: Self-pay | Admitting: Nurse Practitioner

## 2023-08-19 ENCOUNTER — Other Ambulatory Visit (HOSPITAL_COMMUNITY): Payer: Self-pay

## 2023-08-19 NOTE — Telephone Encounter (Signed)
 Patient notes he is needing a PA, see message from patient.

## 2023-08-19 NOTE — Telephone Encounter (Signed)
 Pharmacy Patient Advocate Encounter   Received notification from Patient Advice Request messages that prior authorization for Ozempic is required/requested.   Insurance verification completed.   The patient is insured through [employer] .   Per test claim: PA required; PA submitted to above mentioned insurance via CoverMyMeds Key/confirmation #/EOC [id number] Status is pending

## 2023-08-20 NOTE — Telephone Encounter (Signed)
 Pharmacy Patient Advocate Encounter  Received notification from [employer] [facility] that Prior Authorization for Ozempic has been APPROVED through 08/18/26

## 2023-08-22 ENCOUNTER — Encounter (HOSPITAL_BASED_OUTPATIENT_CLINIC_OR_DEPARTMENT_OTHER): Payer: Medicare Other | Admitting: General Surgery

## 2023-08-22 DIAGNOSIS — E11621 Type 2 diabetes mellitus with foot ulcer: Secondary | ICD-10-CM | POA: Diagnosis not present

## 2023-08-30 ENCOUNTER — Encounter (HOSPITAL_BASED_OUTPATIENT_CLINIC_OR_DEPARTMENT_OTHER): Payer: 59 | Attending: General Surgery | Admitting: General Surgery

## 2023-08-30 DIAGNOSIS — I890 Lymphedema, not elsewhere classified: Secondary | ICD-10-CM | POA: Insufficient documentation

## 2023-08-30 DIAGNOSIS — L97322 Non-pressure chronic ulcer of left ankle with fat layer exposed: Secondary | ICD-10-CM | POA: Diagnosis not present

## 2023-08-30 DIAGNOSIS — L97828 Non-pressure chronic ulcer of other part of left lower leg with other specified severity: Secondary | ICD-10-CM | POA: Insufficient documentation

## 2023-08-30 DIAGNOSIS — L859 Epidermal thickening, unspecified: Secondary | ICD-10-CM | POA: Diagnosis not present

## 2023-08-30 DIAGNOSIS — E11622 Type 2 diabetes mellitus with other skin ulcer: Secondary | ICD-10-CM | POA: Insufficient documentation

## 2023-08-30 DIAGNOSIS — E1151 Type 2 diabetes mellitus with diabetic peripheral angiopathy without gangrene: Secondary | ICD-10-CM | POA: Insufficient documentation

## 2023-08-30 DIAGNOSIS — I87322 Chronic venous hypertension (idiopathic) with inflammation of left lower extremity: Secondary | ICD-10-CM | POA: Insufficient documentation

## 2023-08-30 DIAGNOSIS — L97528 Non-pressure chronic ulcer of other part of left foot with other specified severity: Secondary | ICD-10-CM | POA: Insufficient documentation

## 2023-09-05 ENCOUNTER — Encounter (HOSPITAL_BASED_OUTPATIENT_CLINIC_OR_DEPARTMENT_OTHER): Payer: 59 | Admitting: Internal Medicine

## 2023-09-05 DIAGNOSIS — I87322 Chronic venous hypertension (idiopathic) with inflammation of left lower extremity: Secondary | ICD-10-CM | POA: Diagnosis not present

## 2023-09-12 ENCOUNTER — Encounter (HOSPITAL_BASED_OUTPATIENT_CLINIC_OR_DEPARTMENT_OTHER): Payer: 59 | Admitting: General Surgery

## 2023-09-12 DIAGNOSIS — I87322 Chronic venous hypertension (idiopathic) with inflammation of left lower extremity: Secondary | ICD-10-CM | POA: Diagnosis not present

## 2023-09-19 ENCOUNTER — Encounter (HOSPITAL_BASED_OUTPATIENT_CLINIC_OR_DEPARTMENT_OTHER): Payer: 59 | Admitting: General Surgery

## 2023-09-19 DIAGNOSIS — I87322 Chronic venous hypertension (idiopathic) with inflammation of left lower extremity: Secondary | ICD-10-CM | POA: Diagnosis not present

## 2023-09-26 ENCOUNTER — Encounter (HOSPITAL_BASED_OUTPATIENT_CLINIC_OR_DEPARTMENT_OTHER): Attending: General Surgery | Admitting: General Surgery

## 2023-09-26 DIAGNOSIS — E11621 Type 2 diabetes mellitus with foot ulcer: Secondary | ICD-10-CM | POA: Insufficient documentation

## 2023-09-26 DIAGNOSIS — L97528 Non-pressure chronic ulcer of other part of left foot with other specified severity: Secondary | ICD-10-CM | POA: Diagnosis not present

## 2023-09-26 DIAGNOSIS — E1151 Type 2 diabetes mellitus with diabetic peripheral angiopathy without gangrene: Secondary | ICD-10-CM | POA: Insufficient documentation

## 2023-09-26 DIAGNOSIS — I87322 Chronic venous hypertension (idiopathic) with inflammation of left lower extremity: Secondary | ICD-10-CM | POA: Diagnosis not present

## 2023-09-26 DIAGNOSIS — L97828 Non-pressure chronic ulcer of other part of left lower leg with other specified severity: Secondary | ICD-10-CM | POA: Diagnosis not present

## 2023-09-26 DIAGNOSIS — E11622 Type 2 diabetes mellitus with other skin ulcer: Secondary | ICD-10-CM | POA: Diagnosis not present

## 2023-09-26 DIAGNOSIS — L97322 Non-pressure chronic ulcer of left ankle with fat layer exposed: Secondary | ICD-10-CM | POA: Insufficient documentation

## 2023-09-26 DIAGNOSIS — L859 Epidermal thickening, unspecified: Secondary | ICD-10-CM | POA: Insufficient documentation

## 2023-09-26 DIAGNOSIS — I890 Lymphedema, not elsewhere classified: Secondary | ICD-10-CM | POA: Diagnosis not present

## 2023-10-03 ENCOUNTER — Encounter (HOSPITAL_BASED_OUTPATIENT_CLINIC_OR_DEPARTMENT_OTHER): Admitting: General Surgery

## 2023-10-03 DIAGNOSIS — E1151 Type 2 diabetes mellitus with diabetic peripheral angiopathy without gangrene: Secondary | ICD-10-CM | POA: Diagnosis not present

## 2023-10-10 ENCOUNTER — Encounter (HOSPITAL_BASED_OUTPATIENT_CLINIC_OR_DEPARTMENT_OTHER): Admitting: General Surgery

## 2023-10-10 DIAGNOSIS — E1151 Type 2 diabetes mellitus with diabetic peripheral angiopathy without gangrene: Secondary | ICD-10-CM | POA: Diagnosis not present

## 2023-10-15 ENCOUNTER — Encounter: Payer: Self-pay | Admitting: Nurse Practitioner

## 2023-10-15 ENCOUNTER — Ambulatory Visit (INDEPENDENT_AMBULATORY_CARE_PROVIDER_SITE_OTHER): Payer: Medicare Other | Admitting: Nurse Practitioner

## 2023-10-15 VITALS — BP 118/72 | HR 60 | Ht 73.0 in | Wt 278.6 lb

## 2023-10-15 DIAGNOSIS — Z794 Long term (current) use of insulin: Secondary | ICD-10-CM | POA: Diagnosis not present

## 2023-10-15 DIAGNOSIS — E782 Mixed hyperlipidemia: Secondary | ICD-10-CM | POA: Diagnosis not present

## 2023-10-15 DIAGNOSIS — E1165 Type 2 diabetes mellitus with hyperglycemia: Secondary | ICD-10-CM

## 2023-10-15 DIAGNOSIS — I10 Essential (primary) hypertension: Secondary | ICD-10-CM | POA: Diagnosis not present

## 2023-10-15 LAB — POCT GLYCOSYLATED HEMOGLOBIN (HGB A1C): Hemoglobin A1C: 9.8 % — AB (ref 4.0–5.6)

## 2023-10-15 LAB — POCT UA - MICROALBUMIN: Creatinine, POC: 300 mg/dL

## 2023-10-15 MED ORDER — INSULIN ASPART 100 UNIT/ML IJ SOLN: INTRAMUSCULAR | 3 refills | Status: DC
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
## 2023-10-17 ENCOUNTER — Encounter (HOSPITAL_BASED_OUTPATIENT_CLINIC_OR_DEPARTMENT_OTHER): Admitting: General Surgery

## 2023-10-17 DIAGNOSIS — E1151 Type 2 diabetes mellitus with diabetic peripheral angiopathy without gangrene: Secondary | ICD-10-CM | POA: Diagnosis not present

## 2023-10-18 LAB — HM DIABETES EYE EXAM

## 2023-10-24 ENCOUNTER — Encounter (HOSPITAL_BASED_OUTPATIENT_CLINIC_OR_DEPARTMENT_OTHER): Attending: General Surgery | Admitting: General Surgery

## 2023-10-24 DIAGNOSIS — I890 Lymphedema, not elsewhere classified: Secondary | ICD-10-CM | POA: Insufficient documentation

## 2023-10-24 DIAGNOSIS — E1151 Type 2 diabetes mellitus with diabetic peripheral angiopathy without gangrene: Secondary | ICD-10-CM | POA: Diagnosis not present

## 2023-10-24 DIAGNOSIS — L859 Epidermal thickening, unspecified: Secondary | ICD-10-CM | POA: Diagnosis not present

## 2023-10-24 DIAGNOSIS — L97528 Non-pressure chronic ulcer of other part of left foot with other specified severity: Secondary | ICD-10-CM | POA: Insufficient documentation

## 2023-10-24 DIAGNOSIS — E11621 Type 2 diabetes mellitus with foot ulcer: Secondary | ICD-10-CM | POA: Diagnosis not present

## 2023-10-24 DIAGNOSIS — L97322 Non-pressure chronic ulcer of left ankle with fat layer exposed: Secondary | ICD-10-CM | POA: Diagnosis not present

## 2023-10-24 DIAGNOSIS — I87322 Chronic venous hypertension (idiopathic) with inflammation of left lower extremity: Secondary | ICD-10-CM | POA: Insufficient documentation

## 2023-10-24 DIAGNOSIS — L97828 Non-pressure chronic ulcer of other part of left lower leg with other specified severity: Secondary | ICD-10-CM | POA: Diagnosis not present

## 2023-10-30 ENCOUNTER — Ambulatory Visit: Payer: BC Managed Care – PPO

## 2023-10-30 ENCOUNTER — Encounter (HOSPITAL_COMMUNITY): Payer: BC Managed Care – PPO

## 2023-10-30 ENCOUNTER — Other Ambulatory Visit (HOSPITAL_COMMUNITY): Payer: BC Managed Care – PPO

## 2023-10-31 ENCOUNTER — Ambulatory Visit (HOSPITAL_COMMUNITY)
Admission: RE | Admit: 2023-10-31 | Discharge: 2023-10-31 | Disposition: A | Discharge status: Home / Self Care | Source: Ambulatory Visit | Attending: Internal Medicine | Admitting: Internal Medicine

## 2023-10-31 ENCOUNTER — Other Ambulatory Visit (HOSPITAL_COMMUNITY): Payer: Self-pay | Admitting: Internal Medicine

## 2023-10-31 ENCOUNTER — Encounter (HOSPITAL_BASED_OUTPATIENT_CLINIC_OR_DEPARTMENT_OTHER): Admitting: Internal Medicine

## 2023-10-31 DIAGNOSIS — L97528 Non-pressure chronic ulcer of other part of left foot with other specified severity: Secondary | ICD-10-CM | POA: Insufficient documentation

## 2023-10-31 DIAGNOSIS — E11621 Type 2 diabetes mellitus with foot ulcer: Secondary | ICD-10-CM | POA: Diagnosis not present

## 2023-11-07 ENCOUNTER — Encounter (HOSPITAL_BASED_OUTPATIENT_CLINIC_OR_DEPARTMENT_OTHER): Admitting: Internal Medicine

## 2023-11-07 DIAGNOSIS — E11621 Type 2 diabetes mellitus with foot ulcer: Secondary | ICD-10-CM | POA: Diagnosis not present

## 2023-11-07 LAB — LAB REPORT - SCANNED
Albumin, Urine POC: 2.9
Creatinine, POC: 83 mg/dL
EGFR (African American): 33
Microalb Creat Ratio: 35
PSA, Total: 1.04

## 2023-11-14 ENCOUNTER — Encounter (HOSPITAL_BASED_OUTPATIENT_CLINIC_OR_DEPARTMENT_OTHER): Admitting: General Surgery

## 2023-11-14 DIAGNOSIS — E11621 Type 2 diabetes mellitus with foot ulcer: Secondary | ICD-10-CM | POA: Diagnosis not present

## 2023-11-16 ENCOUNTER — Other Ambulatory Visit: Payer: Self-pay | Admitting: Nurse Practitioner

## 2023-11-21 ENCOUNTER — Encounter (HOSPITAL_BASED_OUTPATIENT_CLINIC_OR_DEPARTMENT_OTHER): Admitting: General Surgery

## 2023-11-21 DIAGNOSIS — E11621 Type 2 diabetes mellitus with foot ulcer: Secondary | ICD-10-CM | POA: Diagnosis not present

## 2023-11-28 ENCOUNTER — Encounter (HOSPITAL_BASED_OUTPATIENT_CLINIC_OR_DEPARTMENT_OTHER): Attending: General Surgery | Admitting: General Surgery

## 2023-11-28 DIAGNOSIS — I87322 Chronic venous hypertension (idiopathic) with inflammation of left lower extremity: Secondary | ICD-10-CM | POA: Insufficient documentation

## 2023-11-28 DIAGNOSIS — I890 Lymphedema, not elsewhere classified: Secondary | ICD-10-CM | POA: Diagnosis not present

## 2023-11-28 DIAGNOSIS — L97528 Non-pressure chronic ulcer of other part of left foot with other specified severity: Secondary | ICD-10-CM | POA: Insufficient documentation

## 2023-11-28 DIAGNOSIS — M86172 Other acute osteomyelitis, left ankle and foot: Secondary | ICD-10-CM | POA: Diagnosis not present

## 2023-11-28 DIAGNOSIS — L859 Epidermal thickening, unspecified: Secondary | ICD-10-CM | POA: Diagnosis not present

## 2023-11-28 DIAGNOSIS — E1151 Type 2 diabetes mellitus with diabetic peripheral angiopathy without gangrene: Secondary | ICD-10-CM | POA: Insufficient documentation

## 2023-11-28 DIAGNOSIS — E1169 Type 2 diabetes mellitus with other specified complication: Secondary | ICD-10-CM | POA: Diagnosis not present

## 2023-12-01 ENCOUNTER — Encounter: Payer: Self-pay | Admitting: Nurse Practitioner

## 2023-12-02 ENCOUNTER — Telehealth: Payer: Self-pay

## 2023-12-02 NOTE — Telephone Encounter (Signed)
 Received message throught MyChart pt needs a PA for his Dexcom G7 sensors.

## 2023-12-03 ENCOUNTER — Telehealth: Payer: Self-pay

## 2023-12-03 ENCOUNTER — Other Ambulatory Visit (HOSPITAL_COMMUNITY): Payer: Self-pay

## 2023-12-03 NOTE — Telephone Encounter (Signed)
 Pharmacy Patient Advocate Encounter   Received notification from Pt Calls Messages that prior authorization for Dexcom G7 sensor is required/requested.   Insurance verification completed.   The patient is insured through [facility] .   Per test claim: PA required; PA submitted to above mentioned insurance via CoverMyMeds Key/confirmation #/EOC [id number] Status is pending

## 2023-12-05 ENCOUNTER — Encounter (HOSPITAL_BASED_OUTPATIENT_CLINIC_OR_DEPARTMENT_OTHER): Attending: General Surgery | Admitting: General Surgery

## 2023-12-05 DIAGNOSIS — E1169 Type 2 diabetes mellitus with other specified complication: Secondary | ICD-10-CM | POA: Insufficient documentation

## 2023-12-05 DIAGNOSIS — L97128 Non-pressure chronic ulcer of left thigh with other specified severity: Secondary | ICD-10-CM | POA: Diagnosis not present

## 2023-12-05 DIAGNOSIS — I87322 Chronic venous hypertension (idiopathic) with inflammation of left lower extremity: Secondary | ICD-10-CM | POA: Insufficient documentation

## 2023-12-05 DIAGNOSIS — E11621 Type 2 diabetes mellitus with foot ulcer: Secondary | ICD-10-CM | POA: Diagnosis not present

## 2023-12-05 DIAGNOSIS — M86172 Other acute osteomyelitis, left ankle and foot: Secondary | ICD-10-CM | POA: Diagnosis not present

## 2023-12-05 DIAGNOSIS — E1151 Type 2 diabetes mellitus with diabetic peripheral angiopathy without gangrene: Secondary | ICD-10-CM | POA: Insufficient documentation

## 2023-12-05 DIAGNOSIS — L859 Epidermal thickening, unspecified: Secondary | ICD-10-CM | POA: Diagnosis not present

## 2023-12-05 DIAGNOSIS — L97528 Non-pressure chronic ulcer of other part of left foot with other specified severity: Secondary | ICD-10-CM | POA: Diagnosis not present

## 2023-12-05 DIAGNOSIS — I890 Lymphedema, not elsewhere classified: Secondary | ICD-10-CM | POA: Insufficient documentation

## 2023-12-11 ENCOUNTER — Other Ambulatory Visit (HOSPITAL_COMMUNITY): Payer: Self-pay

## 2023-12-11 NOTE — Telephone Encounter (Signed)
 Pharmacy Patient Advocate Encounter  Received notification from [facility] that Prior Authorization for Dexcom G7 sensor  has been APPROVED from 12/03/2023 to 12/02/2024. Ran test claim, Copay is $0.00. This test claim was processed through [facility]- copay amounts may vary at other pharmacies due to pharmacy/plan contracts, or as the patient moves through the different stages of their insurance plan.   PA #/Case ID/Reference #: [id number]

## 2023-12-12 ENCOUNTER — Encounter (HOSPITAL_BASED_OUTPATIENT_CLINIC_OR_DEPARTMENT_OTHER): Admitting: General Surgery

## 2023-12-12 DIAGNOSIS — I87322 Chronic venous hypertension (idiopathic) with inflammation of left lower extremity: Secondary | ICD-10-CM | POA: Diagnosis not present

## 2023-12-18 ENCOUNTER — Encounter (HOSPITAL_BASED_OUTPATIENT_CLINIC_OR_DEPARTMENT_OTHER): Admitting: General Surgery

## 2023-12-18 DIAGNOSIS — I87322 Chronic venous hypertension (idiopathic) with inflammation of left lower extremity: Secondary | ICD-10-CM | POA: Diagnosis not present

## 2023-12-19 ENCOUNTER — Encounter (HOSPITAL_BASED_OUTPATIENT_CLINIC_OR_DEPARTMENT_OTHER): Admitting: General Surgery

## 2023-12-26 ENCOUNTER — Encounter (HOSPITAL_BASED_OUTPATIENT_CLINIC_OR_DEPARTMENT_OTHER): Attending: General Surgery | Admitting: General Surgery

## 2023-12-26 DIAGNOSIS — I890 Lymphedema, not elsewhere classified: Secondary | ICD-10-CM | POA: Insufficient documentation

## 2023-12-26 DIAGNOSIS — L97528 Non-pressure chronic ulcer of other part of left foot with other specified severity: Secondary | ICD-10-CM | POA: Diagnosis not present

## 2023-12-26 DIAGNOSIS — M86172 Other acute osteomyelitis, left ankle and foot: Secondary | ICD-10-CM | POA: Diagnosis not present

## 2023-12-26 DIAGNOSIS — E11621 Type 2 diabetes mellitus with foot ulcer: Secondary | ICD-10-CM | POA: Insufficient documentation

## 2023-12-26 DIAGNOSIS — E1151 Type 2 diabetes mellitus with diabetic peripheral angiopathy without gangrene: Secondary | ICD-10-CM | POA: Diagnosis not present

## 2024-01-02 ENCOUNTER — Encounter (HOSPITAL_BASED_OUTPATIENT_CLINIC_OR_DEPARTMENT_OTHER): Admitting: General Surgery

## 2024-01-02 DIAGNOSIS — E11621 Type 2 diabetes mellitus with foot ulcer: Secondary | ICD-10-CM | POA: Diagnosis not present

## 2024-01-08 ENCOUNTER — Ambulatory Visit

## 2024-01-08 ENCOUNTER — Ambulatory Visit (HOSPITAL_COMMUNITY)
Admission: RE | Admit: 2024-01-08 | Discharge: 2024-01-08 | Disposition: A | Discharge status: Home / Self Care | Source: Ambulatory Visit | Attending: Vascular Surgery | Admitting: Vascular Surgery

## 2024-01-08 ENCOUNTER — Ambulatory Visit (HOSPITAL_BASED_OUTPATIENT_CLINIC_OR_DEPARTMENT_OTHER)
Admission: RE | Admit: 2024-01-08 | Discharge: 2024-01-08 | Disposition: A | Discharge status: Home / Self Care | Source: Ambulatory Visit | Attending: Vascular Surgery | Admitting: Vascular Surgery

## 2024-01-08 VITALS — BP 133/81 | HR 59 | Temp 98.1°F | Wt 269.9 lb

## 2024-01-08 DIAGNOSIS — I739 Peripheral vascular disease, unspecified: Secondary | ICD-10-CM

## 2024-01-08 DIAGNOSIS — S81802D Unspecified open wound, left lower leg, subsequent encounter: Secondary | ICD-10-CM | POA: Diagnosis not present

## 2024-01-08 LAB — VAS US ABI WITH/WO TBI
Left ABI: 1.02
Right ABI: 1.23
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
## 2024-01-09 ENCOUNTER — Encounter (HOSPITAL_BASED_OUTPATIENT_CLINIC_OR_DEPARTMENT_OTHER): Admitting: General Surgery

## 2024-01-09 DIAGNOSIS — E11621 Type 2 diabetes mellitus with foot ulcer: Secondary | ICD-10-CM | POA: Diagnosis not present

## 2024-01-10 ENCOUNTER — Other Ambulatory Visit: Payer: Self-pay | Admitting: Nurse Practitioner

## 2024-01-13 ENCOUNTER — Other Ambulatory Visit: Payer: Self-pay

## 2024-01-13 DIAGNOSIS — I739 Peripheral vascular disease, unspecified: Secondary | ICD-10-CM

## 2024-01-16 ENCOUNTER — Encounter (HOSPITAL_BASED_OUTPATIENT_CLINIC_OR_DEPARTMENT_OTHER): Admitting: General Surgery

## 2024-01-16 DIAGNOSIS — E11621 Type 2 diabetes mellitus with foot ulcer: Secondary | ICD-10-CM | POA: Diagnosis not present

## 2024-01-17 ENCOUNTER — Encounter: Payer: Self-pay | Admitting: Nurse Practitioner

## 2024-01-23 ENCOUNTER — Encounter (HOSPITAL_BASED_OUTPATIENT_CLINIC_OR_DEPARTMENT_OTHER): Admitting: General Surgery

## 2024-01-23 DIAGNOSIS — E11621 Type 2 diabetes mellitus with foot ulcer: Secondary | ICD-10-CM | POA: Diagnosis not present

## 2024-01-30 ENCOUNTER — Encounter (HOSPITAL_BASED_OUTPATIENT_CLINIC_OR_DEPARTMENT_OTHER): Attending: General Surgery | Admitting: General Surgery

## 2024-01-30 DIAGNOSIS — I890 Lymphedema, not elsewhere classified: Secondary | ICD-10-CM | POA: Diagnosis not present

## 2024-01-30 DIAGNOSIS — L97528 Non-pressure chronic ulcer of other part of left foot with other specified severity: Secondary | ICD-10-CM | POA: Insufficient documentation

## 2024-01-30 DIAGNOSIS — E1151 Type 2 diabetes mellitus with diabetic peripheral angiopathy without gangrene: Secondary | ICD-10-CM | POA: Insufficient documentation

## 2024-01-30 DIAGNOSIS — M86172 Other acute osteomyelitis, left ankle and foot: Secondary | ICD-10-CM | POA: Diagnosis not present

## 2024-01-30 DIAGNOSIS — E11621 Type 2 diabetes mellitus with foot ulcer: Secondary | ICD-10-CM | POA: Diagnosis not present

## 2024-02-06 ENCOUNTER — Encounter (HOSPITAL_BASED_OUTPATIENT_CLINIC_OR_DEPARTMENT_OTHER): Admitting: General Surgery

## 2024-02-06 DIAGNOSIS — E11621 Type 2 diabetes mellitus with foot ulcer: Secondary | ICD-10-CM | POA: Diagnosis not present

## 2024-02-13 ENCOUNTER — Encounter (HOSPITAL_BASED_OUTPATIENT_CLINIC_OR_DEPARTMENT_OTHER): Admitting: General Surgery

## 2024-02-13 DIAGNOSIS — E11621 Type 2 diabetes mellitus with foot ulcer: Secondary | ICD-10-CM | POA: Diagnosis not present

## 2024-02-18 ENCOUNTER — Ambulatory Visit: Admitting: Nurse Practitioner

## 2024-02-20 ENCOUNTER — Encounter (HOSPITAL_BASED_OUTPATIENT_CLINIC_OR_DEPARTMENT_OTHER): Admitting: General Surgery

## 2024-02-20 DIAGNOSIS — E11621 Type 2 diabetes mellitus with foot ulcer: Secondary | ICD-10-CM | POA: Diagnosis not present

## 2024-02-27 ENCOUNTER — Encounter: Payer: Self-pay | Admitting: Nurse Practitioner

## 2024-02-27 ENCOUNTER — Ambulatory Visit (INDEPENDENT_AMBULATORY_CARE_PROVIDER_SITE_OTHER): Admitting: Nurse Practitioner

## 2024-02-27 ENCOUNTER — Encounter (HOSPITAL_BASED_OUTPATIENT_CLINIC_OR_DEPARTMENT_OTHER): Attending: General Surgery | Admitting: General Surgery

## 2024-02-27 VITALS — BP 110/60 | HR 63 | Ht 73.0 in | Wt 264.6 lb

## 2024-02-27 DIAGNOSIS — I10 Essential (primary) hypertension: Secondary | ICD-10-CM

## 2024-02-27 DIAGNOSIS — E1165 Type 2 diabetes mellitus with hyperglycemia: Secondary | ICD-10-CM | POA: Diagnosis not present

## 2024-02-27 DIAGNOSIS — I890 Lymphedema, not elsewhere classified: Secondary | ICD-10-CM | POA: Insufficient documentation

## 2024-02-27 DIAGNOSIS — Z794 Long term (current) use of insulin: Secondary | ICD-10-CM | POA: Diagnosis not present

## 2024-02-27 DIAGNOSIS — E782 Mixed hyperlipidemia: Secondary | ICD-10-CM

## 2024-02-27 DIAGNOSIS — E11621 Type 2 diabetes mellitus with foot ulcer: Secondary | ICD-10-CM | POA: Diagnosis present

## 2024-02-27 DIAGNOSIS — M86172 Other acute osteomyelitis, left ankle and foot: Secondary | ICD-10-CM | POA: Insufficient documentation

## 2024-02-27 DIAGNOSIS — L97528 Non-pressure chronic ulcer of other part of left foot with other specified severity: Secondary | ICD-10-CM | POA: Insufficient documentation

## 2024-02-27 DIAGNOSIS — E559 Vitamin D deficiency, unspecified: Secondary | ICD-10-CM

## 2024-02-27 DIAGNOSIS — E1151 Type 2 diabetes mellitus with diabetic peripheral angiopathy without gangrene: Secondary | ICD-10-CM | POA: Diagnosis not present

## 2024-02-27 LAB — POCT GLYCOSYLATED HEMOGLOBIN (HGB A1C): Hemoglobin A1C: 7.3 % — AB (ref 4.0–5.6)

## 2024-02-27 MED ORDER — OMNIPOD 5 DEXG7G6 PODS GEN 5 MISC
6 refills | Status: AC
Start: 2024-02-27 — End: ?

## 2024-02-27 MED ORDER — DEXCOM G7 SENSOR MISC: 3 refills | Status: AC

## 2024-02-27 MED ORDER — INSULIN ASPART 100 UNIT/ML IJ SOLN: INTRAMUSCULAR | 3 refills | Status: DC

## 2024-02-27 MED ORDER — OZEMPIC (0.25 OR 0.5 MG/DOSE) 2 MG/3ML ~~LOC~~ SOPN: 0.5000 mg | PEN_INJECTOR | SUBCUTANEOUS | 1 refills | Status: AC
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
## 2024-03-05 ENCOUNTER — Encounter (HOSPITAL_BASED_OUTPATIENT_CLINIC_OR_DEPARTMENT_OTHER): Admitting: General Surgery

## 2024-03-05 DIAGNOSIS — E11621 Type 2 diabetes mellitus with foot ulcer: Secondary | ICD-10-CM | POA: Diagnosis not present

## 2024-03-06 ENCOUNTER — Ambulatory Visit (HOSPITAL_COMMUNITY)
Admission: RE | Admit: 2024-03-06 | Discharge: 2024-03-06 | Disposition: A | Discharge status: Home / Self Care | Source: Ambulatory Visit | Attending: General Surgery | Admitting: General Surgery

## 2024-03-06 ENCOUNTER — Other Ambulatory Visit (HOSPITAL_BASED_OUTPATIENT_CLINIC_OR_DEPARTMENT_OTHER): Payer: Self-pay | Admitting: General Surgery

## 2024-03-06 ENCOUNTER — Encounter (HOSPITAL_COMMUNITY): Payer: Self-pay | Admitting: General Surgery

## 2024-03-06 ENCOUNTER — Other Ambulatory Visit (HOSPITAL_COMMUNITY): Payer: Self-pay | Admitting: General Surgery

## 2024-03-06 ENCOUNTER — Encounter (HOSPITAL_BASED_OUTPATIENT_CLINIC_OR_DEPARTMENT_OTHER): Payer: Self-pay | Admitting: General Surgery

## 2024-03-06 DIAGNOSIS — E11621 Type 2 diabetes mellitus with foot ulcer: Secondary | ICD-10-CM

## 2024-03-06 DIAGNOSIS — Z01818 Encounter for other preprocedural examination: Secondary | ICD-10-CM | POA: Insufficient documentation

## 2024-03-06 DIAGNOSIS — L97509 Non-pressure chronic ulcer of other part of unspecified foot with unspecified severity: Secondary | ICD-10-CM | POA: Insufficient documentation

## 2024-03-06 DIAGNOSIS — M869 Osteomyelitis, unspecified: Secondary | ICD-10-CM | POA: Diagnosis present

## 2024-03-06 DIAGNOSIS — E1169 Type 2 diabetes mellitus with other specified complication: Secondary | ICD-10-CM | POA: Insufficient documentation

## 2024-03-12 ENCOUNTER — Encounter (HOSPITAL_BASED_OUTPATIENT_CLINIC_OR_DEPARTMENT_OTHER): Admitting: General Surgery

## 2024-03-12 DIAGNOSIS — E11621 Type 2 diabetes mellitus with foot ulcer: Secondary | ICD-10-CM | POA: Diagnosis not present

## 2024-03-17 ENCOUNTER — Other Ambulatory Visit (HOSPITAL_COMMUNITY): Payer: Self-pay | Admitting: General Surgery

## 2024-03-17 DIAGNOSIS — E11621 Type 2 diabetes mellitus with foot ulcer: Secondary | ICD-10-CM

## 2024-03-19 ENCOUNTER — Encounter (HOSPITAL_BASED_OUTPATIENT_CLINIC_OR_DEPARTMENT_OTHER): Admitting: General Surgery

## 2024-03-19 DIAGNOSIS — E11621 Type 2 diabetes mellitus with foot ulcer: Secondary | ICD-10-CM | POA: Diagnosis not present

## 2024-03-23 ENCOUNTER — Ambulatory Visit (HOSPITAL_COMMUNITY)
Admission: RE | Admit: 2024-03-23 | Discharge: 2024-03-23 | Disposition: A | Discharge status: Home / Self Care | Source: Ambulatory Visit | Attending: General Surgery | Admitting: General Surgery

## 2024-03-23 DIAGNOSIS — M869 Osteomyelitis, unspecified: Secondary | ICD-10-CM | POA: Insufficient documentation

## 2024-03-23 DIAGNOSIS — E11621 Type 2 diabetes mellitus with foot ulcer: Secondary | ICD-10-CM | POA: Insufficient documentation

## 2024-03-23 DIAGNOSIS — L97509 Non-pressure chronic ulcer of other part of unspecified foot with unspecified severity: Secondary | ICD-10-CM | POA: Diagnosis present

## 2024-03-23 DIAGNOSIS — E1169 Type 2 diabetes mellitus with other specified complication: Secondary | ICD-10-CM | POA: Insufficient documentation

## 2024-03-23 MED ORDER — GADOBUTROL 1 MMOL/ML IV SOLN
10.0000 mL | Freq: Once | INTRAVENOUS | Status: AC | PRN
  Administered 2024-03-23: 10 mL via INTRAVENOUS

## 2024-03-25 ENCOUNTER — Encounter (HOSPITAL_BASED_OUTPATIENT_CLINIC_OR_DEPARTMENT_OTHER): Attending: General Surgery | Admitting: Internal Medicine

## 2024-03-25 DIAGNOSIS — M86172 Other acute osteomyelitis, left ankle and foot: Secondary | ICD-10-CM | POA: Insufficient documentation

## 2024-03-25 DIAGNOSIS — E1151 Type 2 diabetes mellitus with diabetic peripheral angiopathy without gangrene: Secondary | ICD-10-CM | POA: Insufficient documentation

## 2024-03-25 DIAGNOSIS — E11621 Type 2 diabetes mellitus with foot ulcer: Secondary | ICD-10-CM | POA: Insufficient documentation

## 2024-03-25 DIAGNOSIS — Z794 Long term (current) use of insulin: Secondary | ICD-10-CM | POA: Insufficient documentation

## 2024-03-25 DIAGNOSIS — L97528 Non-pressure chronic ulcer of other part of left foot with other specified severity: Secondary | ICD-10-CM | POA: Insufficient documentation

## 2024-03-25 DIAGNOSIS — I890 Lymphedema, not elsewhere classified: Secondary | ICD-10-CM | POA: Insufficient documentation

## 2024-04-02 ENCOUNTER — Encounter (HOSPITAL_BASED_OUTPATIENT_CLINIC_OR_DEPARTMENT_OTHER): Admitting: General Surgery

## 2024-04-02 DIAGNOSIS — M86172 Other acute osteomyelitis, left ankle and foot: Secondary | ICD-10-CM | POA: Diagnosis not present

## 2024-04-02 DIAGNOSIS — I890 Lymphedema, not elsewhere classified: Secondary | ICD-10-CM | POA: Diagnosis not present

## 2024-04-02 DIAGNOSIS — E1151 Type 2 diabetes mellitus with diabetic peripheral angiopathy without gangrene: Secondary | ICD-10-CM | POA: Diagnosis not present

## 2024-04-02 DIAGNOSIS — E11621 Type 2 diabetes mellitus with foot ulcer: Secondary | ICD-10-CM | POA: Diagnosis not present

## 2024-04-02 DIAGNOSIS — L97528 Non-pressure chronic ulcer of other part of left foot with other specified severity: Secondary | ICD-10-CM | POA: Diagnosis present

## 2024-04-02 DIAGNOSIS — Z794 Long term (current) use of insulin: Secondary | ICD-10-CM | POA: Diagnosis not present

## 2024-04-09 ENCOUNTER — Encounter (HOSPITAL_BASED_OUTPATIENT_CLINIC_OR_DEPARTMENT_OTHER): Admitting: Internal Medicine

## 2024-04-09 DIAGNOSIS — L97528 Non-pressure chronic ulcer of other part of left foot with other specified severity: Secondary | ICD-10-CM | POA: Diagnosis not present

## 2024-04-09 DIAGNOSIS — E11621 Type 2 diabetes mellitus with foot ulcer: Secondary | ICD-10-CM

## 2024-04-15 ENCOUNTER — Ambulatory Visit (HOSPITAL_BASED_OUTPATIENT_CLINIC_OR_DEPARTMENT_OTHER)
Admission: RE | Admit: 2024-04-15 | Discharge: 2024-04-15 | Disposition: A | Discharge status: Home / Self Care | Source: Ambulatory Visit | Attending: Vascular Surgery | Admitting: Vascular Surgery

## 2024-04-15 ENCOUNTER — Ambulatory Visit (HOSPITAL_COMMUNITY)
Admission: RE | Admit: 2024-04-15 | Discharge: 2024-04-15 | Disposition: A | Discharge status: Home / Self Care | Source: Ambulatory Visit | Attending: Vascular Surgery | Admitting: Vascular Surgery

## 2024-04-15 ENCOUNTER — Ambulatory Visit: Admitting: Physician Assistant

## 2024-04-15 VITALS — BP 121/66 | HR 55 | Temp 97.7°F

## 2024-04-15 DIAGNOSIS — I739 Peripheral vascular disease, unspecified: Secondary | ICD-10-CM

## 2024-04-15 DIAGNOSIS — I70229 Atherosclerosis of native arteries of extremities with rest pain, unspecified extremity: Secondary | ICD-10-CM | POA: Diagnosis not present

## 2024-04-15 DIAGNOSIS — S81802D Unspecified open wound, left lower leg, subsequent encounter: Secondary | ICD-10-CM

## 2024-04-15 LAB — VAS US ABI WITH/WO TBI
Left ABI: 1.15
Right ABI: 1

## 2024-04-15 NOTE — H&P (View-Only) (Signed)
 Office Note   History of Present Illness   [name] is a 73 y.o. (10/04/1950) male who presents for wound check.  Most recently he underwent I&D of the left leg to remove a Viabahn stent that was nonfunctional and infected.  Currently he goes to the wound care clinic for a chronic left plantar foot wound.  This has been present for nearly 3 years.  He goes to the wound care clinic every Thursday for treatment.  He currently walks with an offloading shoe.  He returns today for follow-up.  He does not feel like his left foot wound has made any progress in the past 3 months.  There are no signs of infection such as puslike drainage, erythema, or tenderness.  He continues to walk in an offloading shoe.  He is still getting wound care including debridements and skin grafting.  The wound care center is also considering hyperbaric therapy.  Current Outpatient Medications  Medication Sig Dispense Refill   acetaminophen  (TYLENOL ) 650 MG CR tablet Take 1,300 mg by mouth 2 (two) times daily as needed for pain.     albuterol  (VENTOLIN  HFA) 108 (90 Base) MCG/ACT inhaler Inhale 1-2 puffs into the lungs every 6 (six) hours as needed for wheezing or shortness of breath. 18 g 1   benzonatate  (TESSALON ) 100 MG capsule Take 1 capsule (100 mg total) by mouth every 8 (eight) hours. (Patient not taking: Reported on 02/27/2024) 21 capsule 0   cetirizine  (ZYRTEC ) 5 MG tablet Take 1 tablet (5 mg total) by mouth at bedtime as needed for allergies. 14 tablet 0   clopidogrel  (PLAVIX ) 75 MG tablet TAKE 1 TABLET(75 MG) BY MOUTH DAILY 90 tablet 2   Continuous Glucose Sensor (DEXCOM G7 SENSOR) MISC Change sensor every 10 days 9 each 3   doxycycline  (VIBRAMYCIN ) 100 MG capsule Take 1 capsule (100 mg total) by mouth 2 (two) times daily. (Patient not taking: Reported on 02/27/2024) 14 capsule 0   ferrous sulfate  325 (65 FE) MG tablet Take 1 tablet (325 mg total) by mouth daily with breakfast.     fluticasone  (FLONASE ) 50  MCG/ACT nasal spray Place 1 spray into both nostrils daily. 16 g 0   insulin  aspart (NOVOLOG ) 100 UNIT/ML injection USE WITH OMNIPOD FOR TOTAL DAILY DOSE AROUND 120 UNITS DAILY 100 mL 3   Insulin  Disposable Pump (OMNIPOD 5 DEXG7G6 PODS GEN 5) MISC Change pod every 48 hours 10 each 6   Insulin  Disposable Pump (OMNIPOD 5 G6 INTRO, GEN 5,) KIT Change pod every 48-72 hours 1 kit 0   L-Methylfolate-Algae-B12-B6 (METANX) 3-90.314-2-35 MG CAPS Take 1 capsule by mouth 2 (two) times daily.     losartan  (COZAAR ) 100 MG tablet Take 100 mg by mouth daily with lunch.     Multiple Vitamins-Minerals (EQL MEGA SELECT MENS PO) Take 1 tablet by mouth daily. Mega Man     ONETOUCH ULTRA test strip Use to check blood glucose 4 times daily. 400 each 1   pravastatin  (PRAVACHOL ) 40 MG tablet Take 40 mg by mouth daily with lunch.   11   pregabalin  (LYRICA ) 50 MG capsule Take 50-100 mg by mouth at bedtime.     Semaglutide ,0.25 or 0.5MG /DOS, (OZEMPIC , 0.25 OR 0.5 MG/DOSE,) 2 MG/3ML SOPN Inject 0.5 mg into the skin once a week. 9 mL 1   Tiotropium Bromide Monohydrate  (SPIRIVA  RESPIMAT) 2.5 MCG/ACT AERS Inhale 2 puffs into the lungs daily. 4 g 0   traMADol  (ULTRAM ) 50 MG tablet Take 1  tablet (50 mg total) by mouth every 12 (twelve) hours as needed for moderate pain. 20 tablet 0   Travoprost, BAK Free, (TRAVATAN) 0.004 % SOLN ophthalmic solution Place 1 drop into both eyes at bedtime.     triamterene -hydrochlorothiazide  (MAXZIDE -25) 37.5-25 MG tablet Take 1 tablet by mouth daily with lunch.     No current facility-administered medications for this visit.    REVIEW OF SYSTEMS (negative unless checked):   Cardiac:  []  Chest pain or chest pressure? []  Shortness of breath upon activity? []  Shortness of breath when lying flat? []  Irregular heart rhythm?  Vascular:  []  Pain in calf, thigh, or hip brought on by walking? []  Pain in feet at night that wakes you up from your sleep? []  Blood clot in your veins? []  Leg  swelling?  Pulmonary:  []  Oxygen at home? []  Productive cough? []  Wheezing?  Neurologic:  []  Sudden weakness in arms or legs? []  Sudden numbness in arms or legs? []  Sudden onset of difficult speaking or slurred speech? []  Temporary loss of vision in one eye? []  Problems with dizziness?  Gastrointestinal:  []  Blood in stool? []  Vomited blood?  Genitourinary:  []  Burning when urinating? []  Blood in urine?  Psychiatric:  []  Major depression  Hematologic:  []  Bleeding problems? []  Problems with blood clotting?  Dermatologic:  [x]  Rashes or ulcers?  Constitutional:  []  Fever or chills?  Ear/Nose/Throat:  []  Change in hearing? []  Nose bleeds? []  Sore throat?  Musculoskeletal:  []  Back pain? []  Joint pain? []  Muscle pain?   Physical Examination   Vitals:   04/15/24 1221  BP: 121/66  Pulse: (!) 55  Temp: 97.7 F (36.5 C)  TempSrc: Temporal   There is no height or weight on file to calculate BMI.  General:  WDWN in NAD; vital signs documented above Gait: Not observed HENT: WNL, normocephalic Pulmonary: normal non-labored breathing , without rales, rhonchi,  wheezing Cardiac: regular Abdomen: soft, NT, no masses Skin: without rashes Vascular Exam/Pulses: brisk left DP Doppler signal Extremities: Unchanged left foot ulceration, healthy tissue present Musculoskeletal: no muscle wasting or atrophy  Neurologic: A&O X 3;  No focal weakness or paresthesias are detected Psychiatric:  The pt has Normal affect.   Non-Invasive Vascular imaging   ABI (04/15/2024) R:  ABI: 1.00 (1.23),  PT: mono DP: mono TBI:  0.54 L:  ABI: 1.15 (1.02),  PT: mono DP: mono TBI: 0.65   LLE arterial Duplex (04/15/2024) Patent left femoral to anterior tibial artery bypass without stenosis  Medical Decision Making   [name] is a 73 y.o. male who presents for wound check  Based on the patient's vascular studies, his ABIs are essentially unchanged.  His right  ABIs 1.00 and left ABI is 1.15. Duplex demonstrates a patent left lower extremity bypass without stenosis or sluggish velocities The patient has a chronic wound to the plantar surface of his left foot.  This has been present for nearly 3 years.  He continues to have formal wound care to the area including debridements and skin grafts recently.  Unfortunately his wound does not appear any better today than it did 3 months ago.  The wound is still the same size without signs of infection.  On exam he has a brisk left DP Doppler signal.  His left lower extremity is maximally revascularized with a patent bypass graft I have discussed with the patient that at this time he would likely benefit from amputation given that his wound has  been present for years now and has not made significant progress over the past 3 months.  He is very open to the idea of amputation and being done with wound care.  He will require angiography, which will likely be diagnostic to help determine what level of amputation he could appropriately heal. We will schedule patient for left lower extremity angiogram with Dr. Sheree in the near future to determine possible future amputation level   Ahmed Holster PA-C [facility] Office: [phone]  Clinic MD: [place]

## 2024-04-15 NOTE — Progress Notes (Signed)
 Office Note   History of Present Illness   [name] is a 73 y.o. (10/04/1950) male who presents for wound check.  Most recently he underwent I&D of the left leg to remove a Viabahn stent that was nonfunctional and infected.  Currently he goes to the wound care clinic for a chronic left plantar foot wound.  This has been present for nearly 3 years.  He goes to the wound care clinic every Thursday for treatment.  He currently walks with an offloading shoe.  He returns today for follow-up.  He does not feel like his left foot wound has made any progress in the past 3 months.  There are no signs of infection such as puslike drainage, erythema, or tenderness.  He continues to walk in an offloading shoe.  He is still getting wound care including debridements and skin grafting.  The wound care center is also considering hyperbaric therapy.  Current Outpatient Medications  Medication Sig Dispense Refill   acetaminophen  (TYLENOL ) 650 MG CR tablet Take 1,300 mg by mouth 2 (two) times daily as needed for pain.     albuterol  (VENTOLIN  HFA) 108 (90 Base) MCG/ACT inhaler Inhale 1-2 puffs into the lungs every 6 (six) hours as needed for wheezing or shortness of breath. 18 g 1   benzonatate  (TESSALON ) 100 MG capsule Take 1 capsule (100 mg total) by mouth every 8 (eight) hours. (Patient not taking: Reported on 02/27/2024) 21 capsule 0   cetirizine  (ZYRTEC ) 5 MG tablet Take 1 tablet (5 mg total) by mouth at bedtime as needed for allergies. 14 tablet 0   clopidogrel  (PLAVIX ) 75 MG tablet TAKE 1 TABLET(75 MG) BY MOUTH DAILY 90 tablet 2   Continuous Glucose Sensor (DEXCOM G7 SENSOR) MISC Change sensor every 10 days 9 each 3   doxycycline  (VIBRAMYCIN ) 100 MG capsule Take 1 capsule (100 mg total) by mouth 2 (two) times daily. (Patient not taking: Reported on 02/27/2024) 14 capsule 0   ferrous sulfate  325 (65 FE) MG tablet Take 1 tablet (325 mg total) by mouth daily with breakfast.     fluticasone  (FLONASE ) 50  MCG/ACT nasal spray Place 1 spray into both nostrils daily. 16 g 0   insulin  aspart (NOVOLOG ) 100 UNIT/ML injection USE WITH OMNIPOD FOR TOTAL DAILY DOSE AROUND 120 UNITS DAILY 100 mL 3   Insulin  Disposable Pump (OMNIPOD 5 DEXG7G6 PODS GEN 5) MISC Change pod every 48 hours 10 each 6   Insulin  Disposable Pump (OMNIPOD 5 G6 INTRO, GEN 5,) KIT Change pod every 48-72 hours 1 kit 0   L-Methylfolate-Algae-B12-B6 (METANX) 3-90.314-2-35 MG CAPS Take 1 capsule by mouth 2 (two) times daily.     losartan  (COZAAR ) 100 MG tablet Take 100 mg by mouth daily with lunch.     Multiple Vitamins-Minerals (EQL MEGA SELECT MENS PO) Take 1 tablet by mouth daily. Mega Man     ONETOUCH ULTRA test strip Use to check blood glucose 4 times daily. 400 each 1   pravastatin  (PRAVACHOL ) 40 MG tablet Take 40 mg by mouth daily with lunch.   11   pregabalin  (LYRICA ) 50 MG capsule Take 50-100 mg by mouth at bedtime.     Semaglutide ,0.25 or 0.5MG /DOS, (OZEMPIC , 0.25 OR 0.5 MG/DOSE,) 2 MG/3ML SOPN Inject 0.5 mg into the skin once a week. 9 mL 1   Tiotropium Bromide Monohydrate  (SPIRIVA  RESPIMAT) 2.5 MCG/ACT AERS Inhale 2 puffs into the lungs daily. 4 g 0   traMADol  (ULTRAM ) 50 MG tablet Take 1  tablet (50 mg total) by mouth every 12 (twelve) hours as needed for moderate pain. 20 tablet 0   Travoprost, BAK Free, (TRAVATAN) 0.004 % SOLN ophthalmic solution Place 1 drop into both eyes at bedtime.     triamterene -hydrochlorothiazide  (MAXZIDE -25) 37.5-25 MG tablet Take 1 tablet by mouth daily with lunch.     No current facility-administered medications for this visit.    REVIEW OF SYSTEMS (negative unless checked):   Cardiac:  []  Chest pain or chest pressure? []  Shortness of breath upon activity? []  Shortness of breath when lying flat? []  Irregular heart rhythm?  Vascular:  []  Pain in calf, thigh, or hip brought on by walking? []  Pain in feet at night that wakes you up from your sleep? []  Blood clot in your veins? []  Leg  swelling?  Pulmonary:  []  Oxygen at home? []  Productive cough? []  Wheezing?  Neurologic:  []  Sudden weakness in arms or legs? []  Sudden numbness in arms or legs? []  Sudden onset of difficult speaking or slurred speech? []  Temporary loss of vision in one eye? []  Problems with dizziness?  Gastrointestinal:  []  Blood in stool? []  Vomited blood?  Genitourinary:  []  Burning when urinating? []  Blood in urine?  Psychiatric:  []  Major depression  Hematologic:  []  Bleeding problems? []  Problems with blood clotting?  Dermatologic:  [x]  Rashes or ulcers?  Constitutional:  []  Fever or chills?  Ear/Nose/Throat:  []  Change in hearing? []  Nose bleeds? []  Sore throat?  Musculoskeletal:  []  Back pain? []  Joint pain? []  Muscle pain?   Physical Examination   Vitals:   04/15/24 1221  BP: 121/66  Pulse: (!) 55  Temp: 97.7 F (36.5 C)  TempSrc: Temporal   There is no height or weight on file to calculate BMI.  General:  WDWN in NAD; vital signs documented above Gait: Not observed HENT: WNL, normocephalic Pulmonary: normal non-labored breathing , without rales, rhonchi,  wheezing Cardiac: regular Abdomen: soft, NT, no masses Skin: without rashes Vascular Exam/Pulses: brisk left DP Doppler signal Extremities: Unchanged left foot ulceration, healthy tissue present Musculoskeletal: no muscle wasting or atrophy  Neurologic: A&O X 3;  No focal weakness or paresthesias are detected Psychiatric:  The pt has Normal affect.   Non-Invasive Vascular imaging   ABI (04/15/2024) R:  ABI: 1.00 (1.23),  PT: mono DP: mono TBI:  0.54 L:  ABI: 1.15 (1.02),  PT: mono DP: mono TBI: 0.65   LLE arterial Duplex (04/15/2024) Patent left femoral to anterior tibial artery bypass without stenosis  Medical Decision Making   [name] is a 73 y.o. male who presents for wound check  Based on the patient's vascular studies, his ABIs are essentially unchanged.  His right  ABIs 1.00 and left ABI is 1.15. Duplex demonstrates a patent left lower extremity bypass without stenosis or sluggish velocities The patient has a chronic wound to the plantar surface of his left foot.  This has been present for nearly 3 years.  He continues to have formal wound care to the area including debridements and skin grafts recently.  Unfortunately his wound does not appear any better today than it did 3 months ago.  The wound is still the same size without signs of infection.  On exam he has a brisk left DP Doppler signal.  His left lower extremity is maximally revascularized with a patent bypass graft I have discussed with the patient that at this time he would likely benefit from amputation given that his wound has  been present for years now and has not made significant progress over the past 3 months.  He is very open to the idea of amputation and being done with wound care.  He will require angiography, which will likely be diagnostic to help determine what level of amputation he could appropriately heal. We will schedule patient for left lower extremity angiogram with Dr. Sheree in the near future to determine possible future amputation level   Ahmed Holster PA-C [facility] Office: [phone]  Clinic MD: [place]

## 2024-04-16 ENCOUNTER — Telehealth: Payer: Self-pay

## 2024-04-16 ENCOUNTER — Encounter (HOSPITAL_BASED_OUTPATIENT_CLINIC_OR_DEPARTMENT_OTHER): Admitting: General Surgery

## 2024-04-16 ENCOUNTER — Other Ambulatory Visit: Payer: Self-pay

## 2024-04-16 DIAGNOSIS — I70229 Atherosclerosis of native arteries of extremities with rest pain, unspecified extremity: Secondary | ICD-10-CM

## 2024-04-16 DIAGNOSIS — E11621 Type 2 diabetes mellitus with foot ulcer: Secondary | ICD-10-CM | POA: Diagnosis not present

## 2024-04-16 NOTE — Telephone Encounter (Signed)
 Attempted to call for surgery scheduling. VM is full.

## 2024-04-16 NOTE — Telephone Encounter (Signed)
 Attempted to call for surgery scheduling. LVM

## 2024-04-20 ENCOUNTER — Encounter (HOSPITAL_COMMUNITY)
Admission: RE | Disposition: A | Discharge status: Home / Self Care | Payer: Self-pay | Source: Home / Self Care | Attending: Vascular Surgery

## 2024-04-20 ENCOUNTER — Ambulatory Visit (HOSPITAL_COMMUNITY)
Admission: RE | Admit: 2024-04-20 | Discharge: 2024-04-20 | Disposition: A | Discharge status: Home / Self Care | Attending: Vascular Surgery | Admitting: Vascular Surgery

## 2024-04-20 ENCOUNTER — Other Ambulatory Visit: Payer: Self-pay

## 2024-04-20 DIAGNOSIS — I70362 Atherosclerosis of unspecified type of bypass graft(s) of the extremities with gangrene, left leg: Secondary | ICD-10-CM | POA: Diagnosis present

## 2024-04-20 DIAGNOSIS — L97529 Non-pressure chronic ulcer of other part of left foot with unspecified severity: Secondary | ICD-10-CM

## 2024-04-20 DIAGNOSIS — I70245 Atherosclerosis of native arteries of left leg with ulceration of other part of foot: Secondary | ICD-10-CM | POA: Diagnosis not present

## 2024-04-20 DIAGNOSIS — I70229 Atherosclerosis of native arteries of extremities with rest pain, unspecified extremity: Secondary | ICD-10-CM

## 2024-04-20 HISTORY — PX: ABDOMINAL AORTOGRAM: CATH118222

## 2024-04-20 HISTORY — PX: LOWER EXTREMITY ANGIOGRAPHY: CATH118251

## 2024-04-20 LAB — POCT I-STAT, CHEM 8
BUN: 24 mg/dL — ABNORMAL HIGH (ref 8–23)
Calcium, Ion: 1.29 mmol/L (ref 1.15–1.40)
Chloride: 104 mmol/L (ref 98–111)
Creatinine, Ser: 1.5 mg/dL — ABNORMAL HIGH (ref 0.61–1.24)
Glucose, Bld: 91 mg/dL (ref 70–99)
HCT: 38 % — ABNORMAL LOW (ref 39.0–52.0)
Hemoglobin: 12.9 g/dL — ABNORMAL LOW (ref 13.0–17.0)
Potassium: 4.2 mmol/L (ref 3.5–5.1)
Sodium: 141 mmol/L (ref 135–145)
TCO2: 25 mmol/L (ref 22–32)

## 2024-04-20 LAB — GLUCOSE, CAPILLARY: Glucose-Capillary: 81 mg/dL (ref 70–99)

## 2024-04-20 MED ORDER — LIDOCAINE HCL (PF) 1 % IJ SOLN
INTRAMUSCULAR | Status: DC | PRN
  Administered 2024-04-20: 10 mL via INTRADERMAL

## 2024-04-20 MED ORDER — SODIUM CHLORIDE 0.9 % IV SOLN: INTRAVENOUS | Status: DC

## 2024-04-20 MED ORDER — HEPARIN (PORCINE) IN NACL 1000-0.9 UT/500ML-% IV SOLN
INTRAVENOUS | Status: DC | PRN
  Administered 2024-04-20: 1000 mL

## 2024-04-20 MED ORDER — ACETAMINOPHEN 325 MG PO TABS: 650.0000 mg | ORAL_TABLET | ORAL | Status: DC | PRN

## 2024-04-20 MED ORDER — MIDAZOLAM HCL (PF) 2 MG/2ML IJ SOLN
INTRAMUSCULAR | Status: DC | PRN
  Administered 2024-04-20: 1 mg via INTRAVENOUS

## 2024-04-20 MED ORDER — FENTANYL CITRATE (PF) 100 MCG/2ML IJ SOLN
INTRAMUSCULAR | Status: AC
  Filled 2024-04-20: qty 2

## 2024-04-20 MED ORDER — FENTANYL CITRATE (PF) 100 MCG/2ML IJ SOLN
INTRAMUSCULAR | Status: DC | PRN
  Administered 2024-04-20: 50 ug via INTRAVENOUS

## 2024-04-20 MED ORDER — HYDRALAZINE HCL 20 MG/ML IJ SOLN: 5.0000 mg | INTRAMUSCULAR | Status: DC | PRN

## 2024-04-20 MED ORDER — OXYCODONE HCL 5 MG PO TABS: 5.0000 mg | ORAL_TABLET | ORAL | Status: DC | PRN

## 2024-04-20 MED ORDER — SODIUM CHLORIDE 0.9% FLUSH: 3.0000 mL | Freq: Two times a day (BID) | INTRAVENOUS | Status: DC

## 2024-04-20 MED ORDER — SODIUM CHLORIDE 0.9 % IV SOLN: 250.0000 mL | INTRAVENOUS | Status: DC | PRN

## 2024-04-20 MED ORDER — MIDAZOLAM HCL 2 MG/2ML IJ SOLN
INTRAMUSCULAR | Status: AC
  Filled 2024-04-20: qty 2

## 2024-04-20 MED ORDER — IODIXANOL 320 MG/ML IV SOLN
INTRAVENOUS | Status: DC | PRN
  Administered 2024-04-20: 60 mL via INTRA_ARTERIAL

## 2024-04-20 MED ORDER — ATROPINE SULFATE 1 MG/10ML IJ SOSY
PREFILLED_SYRINGE | INTRAMUSCULAR | Status: AC
  Filled 2024-04-20: qty 10

## 2024-04-20 MED ORDER — LABETALOL HCL 5 MG/ML IV SOLN: 10.0000 mg | INTRAVENOUS | Status: DC | PRN

## 2024-04-20 MED ORDER — ONDANSETRON HCL 4 MG/2ML IJ SOLN: 4.0000 mg | Freq: Four times a day (QID) | INTRAMUSCULAR | Status: DC | PRN

## 2024-04-20 MED ORDER — SODIUM CHLORIDE 0.9% FLUSH: 3.0000 mL | INTRAVENOUS | Status: DC | PRN

## 2024-04-20 NOTE — Op Note (Signed)
    Patient name: [name] MRN: [id number] DOB: 1950-12-20 Sex: male  04/20/2024 Pre-operative Diagnosis: Chronic left foot wound with history of bypass Post-operative diagnosis:  Same Surgeon:  Penne BROCKS. Sheree, MD Procedure Performed: 1.  Percutaneous ultrasound-guided cannulation right common femoral artery 2.  Aortogram with bilateral lower extremity angiography 3.  Catheter selection left common femoral artery 4.  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  Findings: Aorta and iliac segments bilateral renal arteries are patent.  Bilateral common femoral arteries are patent without flow-limiting stenosis leading into a large profunda and superficial femoral arteries.  The SFA is patent on the left to above the knee where it gives rise to a bypass which terminates in an end-to-side configuration to the anterior tibial artery which is the only runoff to the foot.  He does have disadvantaged flow into the foot with the flow through the bypass and anterior tibial artery is brisk and no intervention was undertaken.   Procedure:  The patient was identified in the holding area and taken to room 8.  The patient was then placed supine on the table and prepped and draped in the usual sterile fashion.  A time out was called.  Ultrasound was used to evaluate the right common femoral artery which was patent and compressible.  The area was anesthetized 1% lidocaine  and cannulated micropuncture needle followed by wire sheath.  An ultrasound images saved to the permanent record.  We placed a Bentson wire followed by 5 French sheath.   Concomitantly we administered fentanyl  and Versed  with moderate sedation and vital signs were monitored throughout the case.  We placed an Omni catheter to the level of L1 and performed aortogram followed by pelvic angiography including the bilateral common femoral arteries.  We then crossed the bifurcation using Omni catheter and Bentson wire.  Left lower extremity angiography was performed.  With the above findings we elected no intervention.  The catheter was removed over wire.  The sheath was then removed and pressure was held for 20 minutes and hemostasis was notable.  He tolerated the procedure without any complication.  Consult: 60cc  Laiden Milles C. Sheree, MD [facility] Office: [phone] Pager: [phone]

## 2024-04-20 NOTE — Progress Notes (Signed)
 Discharge instructions reviewed via telephone with the patients wife Lancelot Alyea.

## 2024-04-20 NOTE — Interval H&P Note (Signed)
 History and Physical Interval Note:  04/20/2024 12:43 PM  [name]  has presented today for surgery, with the diagnosis of critical limb ischemia lt.  The various methods of treatment have been discussed with the patient and family. After consideration of risks, benefits and other options for treatment, the patient has consented to  Procedure(s): ABDOMINAL AORTOGRAM (N/A) Lower Extremity Angiography (N/A) LOWER EXTREMITY INTERVENTION (N/A) as a surgical intervention.  The patient's history has been reviewed, patient examined, no change in status, stable for surgery.  I have reviewed the patient's chart and labs.  Questions were answered to the patient's satisfaction.     [name]

## 2024-04-20 NOTE — Progress Notes (Signed)
 Site area: Right groin Site Prior to Removal:  Level 0 Pressure Applied For: 25 minutes Manual:   Yes Patient Status During Pull: stable  Post Pull Site:  Level 0 Post Pull Instructions Given:  Yes Post Pull Pulses Present: Bilateral Dp's palpable Dressing Applied:  gauze and tegaderm Bedrest begins @ 1600 Comments:

## 2024-04-20 NOTE — Progress Notes (Signed)
 Patient ambulated in the hallway. Right groin site dressing clean, dry, and intact. No bleeding or hematoma noted. Assisted patient with getting dressed, No change to site. Patient ready for discharge.

## 2024-04-20 NOTE — Discharge Instructions (Signed)
 Femoral Site Care This sheet gives you information about how to care for yourself after your procedure. Your health care provider may also give you more specific instructions. If you have problems or questions, contact your health care provider. What can I expect after the procedure?  After the procedure, it is common to have: Bruising that usually fades within 1-2 weeks. Tenderness at the site. Follow these instructions at home: Wound care Follow instructions from your health care provider about how to take care of your insertion site. Make sure you: Wash your hands with soap and water before you change your bandage (dressing). If soap and water are not available, use hand sanitizer. Remove your dressing as told by your health care provider. In 24 hours Do not take baths, swim, or use a hot tub until your health care provider approves. You may shower 24-48 hours after the procedure or as told by your health care provider. Gently wash the site with plain soap and water. Pat the area dry with a clean towel. Do not rub the site. This may cause bleeding. Do not apply powder or lotion to the site. Keep the site clean and dry. Check your femoral site every day for signs of infection. Check for: Redness, swelling, or pain. Fluid or blood. Warmth. Pus or a bad smell. Activity For the first 2-3 days after your procedure, or as long as directed: Avoid climbing stairs as much as possible. Do not squat. Do not lift anything that is heavier than 10 lb (4.5 kg), or the limit that you are told, until your health care provider says that it is safe. For 5 days Rest as directed. Avoid sitting for a long time without moving. Get up to take short walks every 1-2 hours. Do not drive for 24 hours if you were given a medicine to help you relax (sedative). General instructions Take over-the-counter and prescription medicines only as told by your health care provider. Keep all follow-up visits as told by  your health care provider. This is important. Contact a health care provider if you have: A fever or chills. You have redness, swelling, or pain around your insertion site. Get help right away if: The catheter insertion area swells very fast. You pass out. You suddenly start to sweat or your skin gets clammy. The catheter insertion area is bleeding, and the bleeding does not stop when you hold steady pressure on the area. The area near or just beyond the catheter insertion site becomes pale, cool, tingly, or numb. These symptoms may represent a serious problem that is an emergency. Do not wait to see if the symptoms will go away. Get medical help right away. Call your local emergency services (911 in the [place] Do not drive yourself to the hospital. Summary After the procedure, it is common to have bruising that usually fades within 1-2 weeks. Check your femoral site every day for signs of infection. Do not lift anything that is heavier than 10 lb (4.5 kg), or the limit that you are told, until your health care provider says that it is safe. This information is not intended to replace advice given to you by your health care provider. Make sure you discuss any questions you have with your health care provider. Document Revised: 06/24/2017 Document Reviewed: 06/24/2017 Elsevier Patient Education  2020 [employer].

## 2024-04-21 ENCOUNTER — Encounter (HOSPITAL_COMMUNITY): Payer: Self-pay | Admitting: Vascular Surgery

## 2024-04-23 ENCOUNTER — Encounter (HOSPITAL_BASED_OUTPATIENT_CLINIC_OR_DEPARTMENT_OTHER): Admitting: General Surgery

## 2024-04-23 DIAGNOSIS — E11621 Type 2 diabetes mellitus with foot ulcer: Secondary | ICD-10-CM | POA: Diagnosis not present

## 2024-04-24 ENCOUNTER — Other Ambulatory Visit: Payer: Self-pay | Admitting: Internal Medicine

## 2024-04-24 DIAGNOSIS — E1151 Type 2 diabetes mellitus with diabetic peripheral angiopathy without gangrene: Secondary | ICD-10-CM

## 2024-04-24 DIAGNOSIS — K760 Fatty (change of) liver, not elsewhere classified: Secondary | ICD-10-CM

## 2024-04-27 ENCOUNTER — Ambulatory Visit: Admitting: Podiatry

## 2024-04-27 ENCOUNTER — Encounter (HOSPITAL_BASED_OUTPATIENT_CLINIC_OR_DEPARTMENT_OTHER): Admitting: General Surgery

## 2024-04-27 ENCOUNTER — Ambulatory Visit

## 2024-04-27 DIAGNOSIS — M2062 Acquired deformities of toe(s), unspecified, left foot: Secondary | ICD-10-CM | POA: Diagnosis not present

## 2024-04-27 DIAGNOSIS — M86272 Subacute osteomyelitis, left ankle and foot: Secondary | ICD-10-CM

## 2024-04-27 DIAGNOSIS — Z89422 Acquired absence of other left toe(s): Secondary | ICD-10-CM | POA: Diagnosis not present

## 2024-04-27 NOTE — Progress Notes (Signed)
 Subjective:  Patient ID: [name], male    DOB: 09-03-1950,  MRN: [id number]  Chief Complaint  Patient presents with   Toe Pain    Pt stated that he is here to see if anything could be done about his left big toe     Discussed the use of AI scribe software for clinical note transcription with the patient, who gave verbal consent to proceed.  History of Present Illness [name] is a 73 year old male with a history of toe amputations on the left and chronic foot ulcer who presents for evaluation of a non-healing left foot ulcer and consideration of surgical options.  The chronic wound on his left foot has persisted for approximately 156 weeks, initially opening almost two years ago. It nearly healed with a full contact cast and orthopedic shoes but reopened after minimal walking. Despite treatment at a wound care center, including a graft and consideration for hyperbaric treatment, the ulcer remains unhealed.  Following a previous toe amputation, the big toe initially remained straight but eventually fell over due to the absence of a toe block. He can slightly pull the toe up, but it is now somewhat immobile.  He underwent an angiogram last week to assess blood flow. He has a history of vascular issues. He is well known to Dr. Sheree with [facility].   He is currently on antibiotics due to concerns about infection. He experiences fluid drainage and swelling in the leg, attributed to previous vascular surgeries affecting his lymphatic system.      Objective:  There were no vitals filed for this visit.  Physical Exam General: AAO x3, NAD  Dermatological: Not able to see the wound as he was recently graft placed and did not want his venous change.  Vascular: Good capillary refill time to the hallux noted.  Neruologic: Decreased  Musculoskeletal: There are deformity noted along the hallux IPJ and MPJ which is rigid.  No pain on exam although he does have neuropathy.    No images  are attached to the encounter.    Results Radiology : x-rays were obtained and reviewed today.  Multiple views of the left foot were obtained.  Previous partial ray fusions of 2 through 5.     Assessment:   1. Acquired deformity of left toe   2. Subacute osteomyelitis of left foot (HCC)   3. History of amputation of lesser toe of left foot      Plan:  Patient was evaluated and treated and all questions answered.  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 Surgical options considered due to persistent ulcer and deformity. Transmetatarsal amputation recommended to even out bones and reduce pressure points, potentially improving healing. - Reviewed prior MRI, concern for infection.  Currently on antibiotics.  I did not evaluate the wound as he still going to the wound care center will defer to them for treatment.  He recently graft placed if we do not the bandage changed today. - His main concern today was trying to straighten the big toe.  Given likely infection based on the MRI would not advise against treatment of the toe.  Even with straighten the toe we will likely still develop the wound given the biomechanical changes. -My recommendation is transmetatarsal amputation. - Discuss surgical options with Dr. Sheree, including transmetatarsal amputation. - Evaluate the possibility of starting with toe amputation and assessing  outcomes.  Chronic osteoarthritis of left great toe Arthritic joint causing stiffness and deformity. Straightening not advised due to infection risk. - Avoid surgical intervention to straighten the toe due to infection risk.   Return if symptoms worsen or fail to improve.   Donnice JONELLE Fees DPM

## 2024-04-28 ENCOUNTER — Encounter: Payer: Self-pay | Admitting: Nurse Practitioner

## 2024-04-28 ENCOUNTER — Telehealth: Payer: Self-pay | Admitting: *Deleted

## 2024-04-28 MED ORDER — FIASP FLEXTOUCH 100 UNIT/ML ~~LOC~~ SOPN
10.0000 [IU] | PEN_INJECTOR | Freq: Three times a day (TID) | SUBCUTANEOUS | 1 refills | Status: AC
Start: 2024-04-28 — End: ?

## 2024-04-28 MED ORDER — TOUJEO SOLOSTAR 300 UNIT/ML ~~LOC~~ SOPN: 40.0000 [IU] | PEN_INJECTOR | Freq: Every evening | SUBCUTANEOUS | 1 refills | Status: AC

## 2024-04-28 MED ORDER — PEN NEEDLES 31G X 6 MM MISC: 3 refills | Status: AC

## 2024-04-28 NOTE — Telephone Encounter (Signed)
 Patient was called and made aware.

## 2024-04-28 NOTE — Telephone Encounter (Signed)
 He also sent me a clinical cytogeneticist message.  I responded to that just now and sent him in some basal/bolus insulin  pens and needles to the pharmacy we have listed for him.

## 2024-04-28 NOTE — Telephone Encounter (Signed)
 Patient left this message on my voicemail for you. Hyperbaric treatments and he will not be able to ware the Omnipod. He would be wasteful to put one on every 24 hours except on the weekends. He is thinking a long acting insulin  with fast acting insulin  for meals. He will need a script for a long lasting till the treatments are over.  Patient would like answer from [facility] as soon as possible. Plans to start tomorrow at 9:30 am and this will be for 90 minutes a day for a total of 90 minutes. He request a call back as soon as possible.

## 2024-04-30 ENCOUNTER — Encounter (HOSPITAL_BASED_OUTPATIENT_CLINIC_OR_DEPARTMENT_OTHER): Attending: General Surgery | Admitting: General Surgery

## 2024-04-30 ENCOUNTER — Encounter (HOSPITAL_BASED_OUTPATIENT_CLINIC_OR_DEPARTMENT_OTHER): Admitting: General Surgery

## 2024-04-30 DIAGNOSIS — E11621 Type 2 diabetes mellitus with foot ulcer: Secondary | ICD-10-CM | POA: Insufficient documentation

## 2024-04-30 DIAGNOSIS — M86672 Other chronic osteomyelitis, left ankle and foot: Secondary | ICD-10-CM | POA: Insufficient documentation

## 2024-04-30 DIAGNOSIS — I890 Lymphedema, not elsewhere classified: Secondary | ICD-10-CM | POA: Diagnosis not present

## 2024-04-30 DIAGNOSIS — E1151 Type 2 diabetes mellitus with diabetic peripheral angiopathy without gangrene: Secondary | ICD-10-CM | POA: Diagnosis not present

## 2024-04-30 DIAGNOSIS — L97528 Non-pressure chronic ulcer of other part of left foot with other specified severity: Secondary | ICD-10-CM | POA: Insufficient documentation

## 2024-04-30 LAB — GLUCOSE, CAPILLARY
Glucose-Capillary: 192 mg/dL — ABNORMAL HIGH (ref 70–99)
Glucose-Capillary: 128 mg/dL — ABNORMAL HIGH (ref 70–99)

## 2024-05-01 ENCOUNTER — Encounter (HOSPITAL_BASED_OUTPATIENT_CLINIC_OR_DEPARTMENT_OTHER): Admitting: General Surgery

## 2024-05-01 DIAGNOSIS — E11621 Type 2 diabetes mellitus with foot ulcer: Secondary | ICD-10-CM | POA: Diagnosis not present

## 2024-05-01 LAB — GLUCOSE, CAPILLARY
Glucose-Capillary: 175 mg/dL — ABNORMAL HIGH (ref 70–99)
Glucose-Capillary: 152 mg/dL — ABNORMAL HIGH (ref 70–99)

## 2024-05-01 MED ORDER — INSULIN LISPRO (1 UNIT DIAL) 100 UNIT/ML (KWIKPEN): 10.0000 [IU] | PEN_INJECTOR | Freq: Three times a day (TID) | SUBCUTANEOUS | 1 refills | Status: DC

## 2024-05-04 ENCOUNTER — Encounter (HOSPITAL_BASED_OUTPATIENT_CLINIC_OR_DEPARTMENT_OTHER): Admitting: General Surgery

## 2024-05-04 DIAGNOSIS — E11621 Type 2 diabetes mellitus with foot ulcer: Secondary | ICD-10-CM | POA: Diagnosis not present

## 2024-05-04 LAB — GLUCOSE, CAPILLARY
Glucose-Capillary: 198 mg/dL — ABNORMAL HIGH (ref 70–99)
Glucose-Capillary: 213 mg/dL — ABNORMAL HIGH (ref 70–99)

## 2024-05-05 ENCOUNTER — Encounter (HOSPITAL_BASED_OUTPATIENT_CLINIC_OR_DEPARTMENT_OTHER): Admitting: General Surgery

## 2024-05-05 ENCOUNTER — Other Ambulatory Visit: Payer: Self-pay | Admitting: Nurse Practitioner

## 2024-05-05 DIAGNOSIS — E11621 Type 2 diabetes mellitus with foot ulcer: Secondary | ICD-10-CM | POA: Diagnosis not present

## 2024-05-05 LAB — GLUCOSE, CAPILLARY
Glucose-Capillary: 251 mg/dL — ABNORMAL HIGH (ref 70–99)
Glucose-Capillary: 216 mg/dL — ABNORMAL HIGH (ref 70–99)

## 2024-05-05 MED ORDER — NOVOLOG FLEXPEN 100 UNIT/ML ~~LOC~~ SOPN: 10.0000 [IU] | PEN_INJECTOR | Freq: Three times a day (TID) | SUBCUTANEOUS | 3 refills | Status: AC

## 2024-05-06 ENCOUNTER — Encounter (HOSPITAL_BASED_OUTPATIENT_CLINIC_OR_DEPARTMENT_OTHER): Admitting: General Surgery

## 2024-05-06 DIAGNOSIS — E11621 Type 2 diabetes mellitus with foot ulcer: Secondary | ICD-10-CM | POA: Diagnosis not present

## 2024-05-06 LAB — GLUCOSE, CAPILLARY: Glucose-Capillary: 236 mg/dL — ABNORMAL HIGH (ref 70–99)

## 2024-05-07 ENCOUNTER — Encounter (HOSPITAL_BASED_OUTPATIENT_CLINIC_OR_DEPARTMENT_OTHER): Admitting: General Surgery

## 2024-05-07 ENCOUNTER — Ambulatory Visit
Admission: RE | Admit: 2024-05-07 | Discharge: 2024-05-07 | Disposition: A | Source: Ambulatory Visit | Attending: Internal Medicine | Admitting: Internal Medicine

## 2024-05-07 DIAGNOSIS — K760 Fatty (change of) liver, not elsewhere classified: Secondary | ICD-10-CM

## 2024-05-07 DIAGNOSIS — E1151 Type 2 diabetes mellitus with diabetic peripheral angiopathy without gangrene: Secondary | ICD-10-CM

## 2024-05-07 DIAGNOSIS — E11621 Type 2 diabetes mellitus with foot ulcer: Secondary | ICD-10-CM | POA: Diagnosis not present

## 2024-05-07 LAB — GLUCOSE, CAPILLARY
Glucose-Capillary: 153 mg/dL — ABNORMAL HIGH (ref 70–99)
Glucose-Capillary: 164 mg/dL — ABNORMAL HIGH (ref 70–99)
Glucose-Capillary: 151 mg/dL — ABNORMAL HIGH (ref 70–99)

## 2024-05-08 ENCOUNTER — Encounter (HOSPITAL_BASED_OUTPATIENT_CLINIC_OR_DEPARTMENT_OTHER): Admitting: General Surgery

## 2024-05-08 DIAGNOSIS — E11621 Type 2 diabetes mellitus with foot ulcer: Secondary | ICD-10-CM | POA: Diagnosis not present

## 2024-05-08 LAB — GLUCOSE, CAPILLARY
Glucose-Capillary: 186 mg/dL — ABNORMAL HIGH (ref 70–99)
Glucose-Capillary: 168 mg/dL — ABNORMAL HIGH (ref 70–99)

## 2024-05-11 ENCOUNTER — Encounter (HOSPITAL_BASED_OUTPATIENT_CLINIC_OR_DEPARTMENT_OTHER): Admitting: General Surgery

## 2024-05-11 DIAGNOSIS — E11621 Type 2 diabetes mellitus with foot ulcer: Secondary | ICD-10-CM | POA: Diagnosis not present

## 2024-05-11 LAB — GLUCOSE, CAPILLARY
Glucose-Capillary: 165 mg/dL — ABNORMAL HIGH (ref 70–99)
Glucose-Capillary: 136 mg/dL — ABNORMAL HIGH (ref 70–99)

## 2024-05-12 ENCOUNTER — Encounter (HOSPITAL_BASED_OUTPATIENT_CLINIC_OR_DEPARTMENT_OTHER): Admitting: General Surgery

## 2024-05-12 DIAGNOSIS — E11621 Type 2 diabetes mellitus with foot ulcer: Secondary | ICD-10-CM | POA: Diagnosis not present

## 2024-05-12 LAB — GLUCOSE, CAPILLARY: Glucose-Capillary: 190 mg/dL — ABNORMAL HIGH (ref 70–99)

## 2024-05-13 ENCOUNTER — Encounter (HOSPITAL_BASED_OUTPATIENT_CLINIC_OR_DEPARTMENT_OTHER): Admitting: General Surgery

## 2024-05-13 DIAGNOSIS — E11621 Type 2 diabetes mellitus with foot ulcer: Secondary | ICD-10-CM | POA: Diagnosis not present

## 2024-05-13 LAB — GLUCOSE, CAPILLARY: Glucose-Capillary: 270 mg/dL — ABNORMAL HIGH (ref 70–99)

## 2024-05-14 ENCOUNTER — Encounter (HOSPITAL_BASED_OUTPATIENT_CLINIC_OR_DEPARTMENT_OTHER): Admitting: General Surgery

## 2024-05-14 DIAGNOSIS — E11621 Type 2 diabetes mellitus with foot ulcer: Secondary | ICD-10-CM | POA: Diagnosis not present

## 2024-05-14 LAB — GLUCOSE, CAPILLARY
Glucose-Capillary: 197 mg/dL — ABNORMAL HIGH (ref 70–99)
Glucose-Capillary: 182 mg/dL — ABNORMAL HIGH (ref 70–99)
Glucose-Capillary: 196 mg/dL — ABNORMAL HIGH (ref 70–99)

## 2024-05-15 ENCOUNTER — Encounter (HOSPITAL_BASED_OUTPATIENT_CLINIC_OR_DEPARTMENT_OTHER): Admitting: General Surgery

## 2024-05-15 DIAGNOSIS — E11621 Type 2 diabetes mellitus with foot ulcer: Secondary | ICD-10-CM | POA: Diagnosis not present

## 2024-05-15 LAB — GLUCOSE, CAPILLARY: Glucose-Capillary: 209 mg/dL — ABNORMAL HIGH (ref 70–99)

## 2024-05-18 ENCOUNTER — Encounter (HOSPITAL_BASED_OUTPATIENT_CLINIC_OR_DEPARTMENT_OTHER): Admitting: General Surgery

## 2024-05-18 DIAGNOSIS — E11621 Type 2 diabetes mellitus with foot ulcer: Secondary | ICD-10-CM | POA: Diagnosis not present

## 2024-05-18 LAB — GLUCOSE, CAPILLARY
Glucose-Capillary: 199 mg/dL — ABNORMAL HIGH (ref 70–99)
Glucose-Capillary: 180 mg/dL — ABNORMAL HIGH (ref 70–99)
Glucose-Capillary: 176 mg/dL — ABNORMAL HIGH (ref 70–99)

## 2024-05-19 ENCOUNTER — Encounter (HOSPITAL_BASED_OUTPATIENT_CLINIC_OR_DEPARTMENT_OTHER): Admitting: General Surgery

## 2024-05-19 DIAGNOSIS — E11621 Type 2 diabetes mellitus with foot ulcer: Secondary | ICD-10-CM | POA: Diagnosis not present

## 2024-05-19 LAB — GLUCOSE, CAPILLARY
Glucose-Capillary: 223 mg/dL — ABNORMAL HIGH (ref 70–99)
Glucose-Capillary: 181 mg/dL — ABNORMAL HIGH (ref 70–99)

## 2024-05-20 ENCOUNTER — Encounter (HOSPITAL_BASED_OUTPATIENT_CLINIC_OR_DEPARTMENT_OTHER): Admitting: General Surgery

## 2024-05-20 DIAGNOSIS — E11621 Type 2 diabetes mellitus with foot ulcer: Secondary | ICD-10-CM | POA: Diagnosis not present

## 2024-05-20 LAB — GLUCOSE, CAPILLARY
Glucose-Capillary: 182 mg/dL — ABNORMAL HIGH (ref 70–99)
Glucose-Capillary: 163 mg/dL — ABNORMAL HIGH (ref 70–99)

## 2024-05-25 ENCOUNTER — Encounter (HOSPITAL_BASED_OUTPATIENT_CLINIC_OR_DEPARTMENT_OTHER): Admitting: General Surgery

## 2024-05-25 DIAGNOSIS — I890 Lymphedema, not elsewhere classified: Secondary | ICD-10-CM | POA: Insufficient documentation

## 2024-05-25 DIAGNOSIS — E1151 Type 2 diabetes mellitus with diabetic peripheral angiopathy without gangrene: Secondary | ICD-10-CM | POA: Diagnosis not present

## 2024-05-25 DIAGNOSIS — L97528 Non-pressure chronic ulcer of other part of left foot with other specified severity: Secondary | ICD-10-CM | POA: Diagnosis not present

## 2024-05-25 DIAGNOSIS — M86672 Other chronic osteomyelitis, left ankle and foot: Secondary | ICD-10-CM | POA: Diagnosis not present

## 2024-05-25 DIAGNOSIS — E11621 Type 2 diabetes mellitus with foot ulcer: Secondary | ICD-10-CM | POA: Insufficient documentation

## 2024-05-25 LAB — GLUCOSE, CAPILLARY
Glucose-Capillary: 256 mg/dL — ABNORMAL HIGH (ref 70–99)
Glucose-Capillary: 188 mg/dL — ABNORMAL HIGH (ref 70–99)

## 2024-05-26 ENCOUNTER — Encounter (HOSPITAL_BASED_OUTPATIENT_CLINIC_OR_DEPARTMENT_OTHER): Admitting: General Surgery

## 2024-05-26 DIAGNOSIS — E11621 Type 2 diabetes mellitus with foot ulcer: Secondary | ICD-10-CM | POA: Diagnosis not present

## 2024-05-26 LAB — GLUCOSE, CAPILLARY: Glucose-Capillary: 142 mg/dL — ABNORMAL HIGH (ref 70–99)

## 2024-05-27 ENCOUNTER — Encounter (HOSPITAL_BASED_OUTPATIENT_CLINIC_OR_DEPARTMENT_OTHER): Admitting: General Surgery

## 2024-05-27 DIAGNOSIS — E11621 Type 2 diabetes mellitus with foot ulcer: Secondary | ICD-10-CM | POA: Diagnosis not present

## 2024-05-27 LAB — GLUCOSE, CAPILLARY: Glucose-Capillary: 199 mg/dL — ABNORMAL HIGH (ref 70–99)

## 2024-05-28 ENCOUNTER — Encounter (HOSPITAL_BASED_OUTPATIENT_CLINIC_OR_DEPARTMENT_OTHER): Admitting: General Surgery

## 2024-05-28 DIAGNOSIS — M86672 Other chronic osteomyelitis, left ankle and foot: Secondary | ICD-10-CM | POA: Insufficient documentation

## 2024-05-28 DIAGNOSIS — E1151 Type 2 diabetes mellitus with diabetic peripheral angiopathy without gangrene: Secondary | ICD-10-CM | POA: Insufficient documentation

## 2024-05-28 DIAGNOSIS — L97528 Non-pressure chronic ulcer of other part of left foot with other specified severity: Secondary | ICD-10-CM | POA: Diagnosis present

## 2024-05-28 DIAGNOSIS — I890 Lymphedema, not elsewhere classified: Secondary | ICD-10-CM | POA: Diagnosis not present

## 2024-05-28 DIAGNOSIS — E11621 Type 2 diabetes mellitus with foot ulcer: Secondary | ICD-10-CM | POA: Diagnosis not present

## 2024-05-28 DIAGNOSIS — R001 Bradycardia, unspecified: Secondary | ICD-10-CM | POA: Diagnosis not present

## 2024-05-29 ENCOUNTER — Encounter (HOSPITAL_BASED_OUTPATIENT_CLINIC_OR_DEPARTMENT_OTHER): Admitting: General Surgery

## 2024-05-31 ENCOUNTER — Other Ambulatory Visit: Payer: Self-pay | Admitting: Family Medicine

## 2024-06-01 ENCOUNTER — Encounter (HOSPITAL_BASED_OUTPATIENT_CLINIC_OR_DEPARTMENT_OTHER): Admitting: Internal Medicine

## 2024-06-02 ENCOUNTER — Encounter (HOSPITAL_BASED_OUTPATIENT_CLINIC_OR_DEPARTMENT_OTHER): Admitting: Internal Medicine

## 2024-06-02 DIAGNOSIS — M86672 Other chronic osteomyelitis, left ankle and foot: Secondary | ICD-10-CM | POA: Diagnosis not present

## 2024-06-02 DIAGNOSIS — E11621 Type 2 diabetes mellitus with foot ulcer: Secondary | ICD-10-CM | POA: Diagnosis not present

## 2024-06-02 DIAGNOSIS — L97528 Non-pressure chronic ulcer of other part of left foot with other specified severity: Secondary | ICD-10-CM | POA: Diagnosis not present

## 2024-06-02 DIAGNOSIS — E1151 Type 2 diabetes mellitus with diabetic peripheral angiopathy without gangrene: Secondary | ICD-10-CM | POA: Diagnosis not present

## 2024-06-02 LAB — GLUCOSE, CAPILLARY: Glucose-Capillary: 173 mg/dL — ABNORMAL HIGH (ref 70–99)

## 2024-06-03 ENCOUNTER — Encounter (HOSPITAL_BASED_OUTPATIENT_CLINIC_OR_DEPARTMENT_OTHER): Admitting: Internal Medicine

## 2024-06-03 DIAGNOSIS — E11621 Type 2 diabetes mellitus with foot ulcer: Secondary | ICD-10-CM | POA: Diagnosis not present

## 2024-06-03 LAB — GLUCOSE, CAPILLARY: Glucose-Capillary: 138 mg/dL — ABNORMAL HIGH (ref 70–99)

## 2024-06-04 ENCOUNTER — Encounter (HOSPITAL_BASED_OUTPATIENT_CLINIC_OR_DEPARTMENT_OTHER): Admitting: Internal Medicine

## 2024-06-04 DIAGNOSIS — E11621 Type 2 diabetes mellitus with foot ulcer: Secondary | ICD-10-CM | POA: Diagnosis not present

## 2024-06-04 DIAGNOSIS — L97528 Non-pressure chronic ulcer of other part of left foot with other specified severity: Secondary | ICD-10-CM

## 2024-06-04 LAB — GLUCOSE, CAPILLARY: Glucose-Capillary: 80 mg/dL (ref 70–99)

## 2024-06-05 ENCOUNTER — Encounter (HOSPITAL_BASED_OUTPATIENT_CLINIC_OR_DEPARTMENT_OTHER): Admitting: Internal Medicine

## 2024-06-06 ENCOUNTER — Encounter: Payer: Self-pay | Admitting: Nurse Practitioner

## 2024-06-08 ENCOUNTER — Encounter (HOSPITAL_BASED_OUTPATIENT_CLINIC_OR_DEPARTMENT_OTHER): Admitting: Internal Medicine

## 2024-06-09 ENCOUNTER — Encounter (HOSPITAL_BASED_OUTPATIENT_CLINIC_OR_DEPARTMENT_OTHER): Admitting: General Surgery

## 2024-06-10 ENCOUNTER — Encounter (HOSPITAL_BASED_OUTPATIENT_CLINIC_OR_DEPARTMENT_OTHER): Admitting: General Surgery

## 2024-06-11 ENCOUNTER — Encounter (HOSPITAL_BASED_OUTPATIENT_CLINIC_OR_DEPARTMENT_OTHER): Admitting: General Surgery

## 2024-06-11 DIAGNOSIS — E11621 Type 2 diabetes mellitus with foot ulcer: Secondary | ICD-10-CM | POA: Diagnosis not present

## 2024-06-11 DIAGNOSIS — L97528 Non-pressure chronic ulcer of other part of left foot with other specified severity: Secondary | ICD-10-CM

## 2024-06-12 ENCOUNTER — Encounter (HOSPITAL_BASED_OUTPATIENT_CLINIC_OR_DEPARTMENT_OTHER): Admitting: General Surgery

## 2024-06-15 ENCOUNTER — Encounter (HOSPITAL_BASED_OUTPATIENT_CLINIC_OR_DEPARTMENT_OTHER): Admitting: General Surgery

## 2024-06-16 ENCOUNTER — Encounter (HOSPITAL_BASED_OUTPATIENT_CLINIC_OR_DEPARTMENT_OTHER): Admitting: General Surgery

## 2024-06-17 ENCOUNTER — Encounter (HOSPITAL_BASED_OUTPATIENT_CLINIC_OR_DEPARTMENT_OTHER): Admitting: General Surgery

## 2024-06-19 ENCOUNTER — Encounter (HOSPITAL_BASED_OUTPATIENT_CLINIC_OR_DEPARTMENT_OTHER): Admitting: General Surgery

## 2024-06-22 ENCOUNTER — Encounter (HOSPITAL_BASED_OUTPATIENT_CLINIC_OR_DEPARTMENT_OTHER): Admitting: General Surgery

## 2024-06-22 DIAGNOSIS — E11621 Type 2 diabetes mellitus with foot ulcer: Secondary | ICD-10-CM | POA: Diagnosis not present

## 2024-06-23 ENCOUNTER — Encounter (HOSPITAL_BASED_OUTPATIENT_CLINIC_OR_DEPARTMENT_OTHER): Admitting: General Surgery

## 2024-06-23 LAB — COMPREHENSIVE METABOLIC PANEL WITH GFR
ALT: 13 IU/L (ref 0–44)
AST: 20 IU/L (ref 0–40)
Albumin: 4.3 g/dL (ref 3.8–4.8)
Alkaline Phosphatase: 120 IU/L (ref 47–123)
BUN/Creatinine Ratio: 12 (ref 10–24)
BUN: 18 mg/dL (ref 8–27)
Bilirubin Total: 0.3 mg/dL (ref 0.0–1.2)
CO2: 22 mmol/L (ref 20–29)
Calcium: 10 mg/dL (ref 8.6–10.2)
Chloride: 103 mmol/L (ref 96–106)
Creatinine, Ser: 1.56 mg/dL — ABNORMAL HIGH (ref 0.76–1.27)
Globulin, Total: 2.8 g/dL (ref 1.5–4.5)
Glucose: 141 mg/dL — ABNORMAL HIGH (ref 70–99)
Potassium: 4.6 mmol/L (ref 3.5–5.2)
Sodium: 137 mmol/L (ref 134–144)
Total Protein: 7.1 g/dL (ref 6.0–8.5)
eGFR: 47 mL/min/1.73 — ABNORMAL LOW

## 2024-06-23 LAB — LIPID PANEL
Chol/HDL Ratio: 3.3 ratio (ref 0.0–5.0)
Cholesterol, Total: 122 mg/dL (ref 100–199)
HDL: 37 mg/dL — ABNORMAL LOW
LDL Chol Calc (NIH): 66 mg/dL (ref 0–99)
Triglycerides: 99 mg/dL (ref 0–149)
VLDL Cholesterol Cal: 19 mg/dL (ref 5–40)

## 2024-06-23 LAB — T4, FREE: Free T4: 1.05 ng/dL (ref 0.82–1.77)

## 2024-06-23 LAB — VITAMIN D 25 HYDROXY (VIT D DEFICIENCY, FRACTURES): Vit D, 25-Hydroxy: 42.5 ng/mL (ref 30.0–100.0)

## 2024-06-23 LAB — TSH: TSH: 0.998 u[IU]/mL (ref 0.450–4.500)

## 2024-06-24 ENCOUNTER — Encounter (HOSPITAL_BASED_OUTPATIENT_CLINIC_OR_DEPARTMENT_OTHER): Admitting: General Surgery

## 2024-06-24 NOTE — Progress Notes (Signed)
"  A user error has taken place: encounter opened in error, closed for administrative reasons.      "

## 2024-06-29 ENCOUNTER — Encounter (HOSPITAL_BASED_OUTPATIENT_CLINIC_OR_DEPARTMENT_OTHER): Admitting: General Surgery

## 2024-06-30 ENCOUNTER — Encounter (HOSPITAL_BASED_OUTPATIENT_CLINIC_OR_DEPARTMENT_OTHER): Admitting: General Surgery

## 2024-06-30 ENCOUNTER — Ambulatory Visit: Admitting: Nurse Practitioner

## 2024-07-01 ENCOUNTER — Encounter (HOSPITAL_BASED_OUTPATIENT_CLINIC_OR_DEPARTMENT_OTHER): Admitting: General Surgery

## 2024-07-01 ENCOUNTER — Ambulatory Visit (INDEPENDENT_AMBULATORY_CARE_PROVIDER_SITE_OTHER): Admitting: Nurse Practitioner

## 2024-07-01 ENCOUNTER — Encounter: Payer: Self-pay | Admitting: Nurse Practitioner

## 2024-07-01 VITALS — BP 110/64 | HR 84 | Ht 73.0 in | Wt 272.8 lb

## 2024-07-01 DIAGNOSIS — E782 Mixed hyperlipidemia: Secondary | ICD-10-CM

## 2024-07-01 DIAGNOSIS — I10 Essential (primary) hypertension: Secondary | ICD-10-CM

## 2024-07-01 DIAGNOSIS — E1165 Type 2 diabetes mellitus with hyperglycemia: Secondary | ICD-10-CM

## 2024-07-01 DIAGNOSIS — Z794 Long term (current) use of insulin: Secondary | ICD-10-CM | POA: Diagnosis not present

## 2024-07-01 DIAGNOSIS — E559 Vitamin D deficiency, unspecified: Secondary | ICD-10-CM | POA: Diagnosis not present

## 2024-07-01 LAB — POCT GLYCOSYLATED HEMOGLOBIN (HGB A1C): Hemoglobin A1C: 8 % — AB (ref 4.0–5.6)
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
## 2024-07-02 ENCOUNTER — Encounter (HOSPITAL_BASED_OUTPATIENT_CLINIC_OR_DEPARTMENT_OTHER): Attending: General Surgery | Admitting: General Surgery

## 2024-07-02 DIAGNOSIS — M86672 Other chronic osteomyelitis, left ankle and foot: Secondary | ICD-10-CM | POA: Diagnosis not present

## 2024-07-02 DIAGNOSIS — E11621 Type 2 diabetes mellitus with foot ulcer: Secondary | ICD-10-CM | POA: Diagnosis present

## 2024-07-02 DIAGNOSIS — L97528 Non-pressure chronic ulcer of other part of left foot with other specified severity: Secondary | ICD-10-CM | POA: Insufficient documentation

## 2024-07-02 DIAGNOSIS — R001 Bradycardia, unspecified: Secondary | ICD-10-CM | POA: Diagnosis not present

## 2024-07-02 DIAGNOSIS — I890 Lymphedema, not elsewhere classified: Secondary | ICD-10-CM | POA: Diagnosis not present

## 2024-07-02 DIAGNOSIS — L97128 Non-pressure chronic ulcer of left thigh with other specified severity: Secondary | ICD-10-CM | POA: Insufficient documentation

## 2024-07-02 DIAGNOSIS — E1151 Type 2 diabetes mellitus with diabetic peripheral angiopathy without gangrene: Secondary | ICD-10-CM | POA: Insufficient documentation

## 2024-07-08 ENCOUNTER — Other Ambulatory Visit: Payer: Self-pay | Admitting: Gastroenterology

## 2024-07-09 ENCOUNTER — Ambulatory Visit: Attending: Cardiology | Admitting: Cardiology

## 2024-07-09 ENCOUNTER — Encounter (HOSPITAL_BASED_OUTPATIENT_CLINIC_OR_DEPARTMENT_OTHER): Admitting: General Surgery

## 2024-07-09 ENCOUNTER — Ambulatory Visit

## 2024-07-09 VITALS — BP 126/80 | HR 62 | Wt 262.0 lb

## 2024-07-09 DIAGNOSIS — R001 Bradycardia, unspecified: Secondary | ICD-10-CM | POA: Insufficient documentation

## 2024-07-09 DIAGNOSIS — R002 Palpitations: Secondary | ICD-10-CM | POA: Insufficient documentation

## 2024-07-09 DIAGNOSIS — E11621 Type 2 diabetes mellitus with foot ulcer: Secondary | ICD-10-CM | POA: Diagnosis not present
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
## 2024-07-09 NOTE — Progress Notes (Unsigned)
 Enrolled for [employer] to mail a ZIO XT long term holter monitor to the patients address on file.

## 2024-07-09 NOTE — Patient Instructions (Signed)
 Medication Instructions:  Your physician recommends that you continue on your current medications as directed. Please refer to the Current Medication list given to you today.  *If you need a refill on your cardiac medications before your next appointment, please call your pharmacy*  Lab Work: None ordered  If you have any lab test that is abnormal or we need to change your treatment, we will call you to review the results.  Testing/Procedures:                           [name]- Long Term Monitor Instructions  Your physician has requested you wear a ZIO patch monitor for 14 days.  This is a single patch monitor. [employer] supplies one patch monitor per enrollment. Additional stickers are not available. Please do not apply patch if you will be having a Nuclear Stress Test,  Echocardiogram, Cardiac CT, MRI, or Chest Xray during the period you would be wearing the  monitor. The patch cannot be worn during these tests. You cannot remove and re-apply the  ZIO XT patch monitor.  Your ZIO patch monitor will be mailed 3 day USPS to your address on file. It may take 3-5 days  to receive your monitor after you have been enrolled.  Once you have received your monitor, please review the enclosed instructions. Your monitor  has already been registered assigning a specific monitor serial # to you.  Billing and Patient Assistance Program Information  We have supplied [employer] with any of your insurance information on file for billing purposes. [employer] offers a sliding scale Patient Assistance Program for patients that do not have  insurance, or whose insurance does not completely cover the cost of the ZIO monitor.  You must apply for the Patient Assistance Program to qualify for this discounted rate.  To apply, please call [employer] at [phone], select option 4, select option 2, ask to apply for  Patient Assistance Program. Meredeth will ask your household income, and how many people  are in your  household. They will quote your out-of-pocket cost based on that information.  [employer] will also be able to set up a 35-month, interest-free payment plan if needed.  Applying the monitor   Shave hair from upper left chest.  Hold abrader disc by orange tab. Rub abrader in 40 strokes over the upper left chest as  indicated in your monitor instructions.  Clean area with 4 enclosed alcohol pads. Let dry.  Apply patch as indicated in monitor instructions. Patch will be placed under collarbone on left  side of chest with arrow pointing upward.  Rub patch adhesive wings for 2 minutes. Remove white label marked 1. Remove the white  label marked 2. Rub patch adhesive wings for 2 additional minutes.  While looking in a mirror, press and release button in center of patch. A small green light will  flash 3-4 times. This will be your only indicator that the monitor has been turned on.  Do not shower for the first 24 hours. You may shower after the first 24 hours.  Press the button if you feel a symptom. You will hear a small click. Record Date, Time and  Symptom in the Patient Logbook.  When you are ready to remove the patch, follow instructions on the last 2 pages of Patient  Logbook. Stick patch monitor onto the last page of Patient Logbook.  Place Patient Logbook in the blue and white box. Use locking tab  on box and tape box closed  securely. The blue and white box has prepaid postage on it. Please place it in the mailbox as  soon as possible. Your physician should have your test results approximately 7 days after the  monitor has been mailed back to [facility].  Call [facility] Customer Care at [phone] if you have questions regarding  your ZIO XT patch monitor. Call them immediately if you see an orange light blinking on your  monitor.  If your monitor falls off in less than 4 days, contact our Monitor department at [phone].  If your monitor becomes loose or falls off after  4 days call [employer] at [phone] for  suggestions on securing your monitor    Follow-Up: At [facility], you and your health needs are our priority.  As part of our continuing mission to provide you with exceptional heart care, our providers are all part of one team.  This team includes your primary Cardiologist (physician) and Advanced Practice Providers or APPs (Physician Assistants and Nurse Practitioners) who all work together to provide you with the care you need, when you need it.  Your next appointment:   To be determined  Provider:   Soyla Norton, MD    Thank you for choosing [facility]!!   Maeola Domino, RN [phone]

## 2024-07-09 NOTE — Addendum Note (Signed)
 Addended by: GRETEL MAEOLA CROME on: 07/09/2024 09:53 AM   Modules accepted: Orders

## 2024-07-10 ENCOUNTER — Encounter: Payer: Self-pay | Admitting: Nurse Practitioner

## 2024-07-15 ENCOUNTER — Encounter: Payer: Self-pay | Admitting: Nurse Practitioner

## 2024-07-16 ENCOUNTER — Encounter (HOSPITAL_BASED_OUTPATIENT_CLINIC_OR_DEPARTMENT_OTHER): Admitting: General Surgery

## 2024-07-16 DIAGNOSIS — E11621 Type 2 diabetes mellitus with foot ulcer: Secondary | ICD-10-CM | POA: Diagnosis not present

## 2024-07-20 ENCOUNTER — Telehealth (HOSPITAL_COMMUNITY): Payer: Self-pay

## 2024-07-20 ENCOUNTER — Encounter (HOSPITAL_COMMUNITY): Payer: Self-pay | Admitting: Gastroenterology

## 2024-07-20 NOTE — Telephone Encounter (Signed)
 [name] was scheduled for Colonoscopy with Dr. Rollin on 07/30/24, at [facility] hospital.   Patient/or family called on 07/20/24 to cancel their procedure due to not having any issues right now, feels like doesn't need.  Dr Rollin & office notified. Patient instructed to call physician's office to reschedule their procedure. Patient demonstrated understanding.

## 2024-07-23 ENCOUNTER — Encounter (HOSPITAL_BASED_OUTPATIENT_CLINIC_OR_DEPARTMENT_OTHER): Admitting: General Surgery

## 2024-07-24 ENCOUNTER — Encounter (HOSPITAL_BASED_OUTPATIENT_CLINIC_OR_DEPARTMENT_OTHER): Admitting: General Surgery

## 2024-07-24 DIAGNOSIS — E11621 Type 2 diabetes mellitus with foot ulcer: Secondary | ICD-10-CM | POA: Diagnosis not present

## 2024-07-28 ENCOUNTER — Encounter (HOSPITAL_BASED_OUTPATIENT_CLINIC_OR_DEPARTMENT_OTHER): Admitting: General Surgery

## 2024-07-28 LAB — GLUCOSE, CAPILLARY
Glucose-Capillary: 158 mg/dL — ABNORMAL HIGH (ref 70–99)
Glucose-Capillary: 116 mg/dL — ABNORMAL HIGH (ref 70–99)

## 2024-07-29 ENCOUNTER — Encounter (HOSPITAL_BASED_OUTPATIENT_CLINIC_OR_DEPARTMENT_OTHER): Admitting: General Surgery

## 2024-07-29 LAB — GLUCOSE, CAPILLARY
Glucose-Capillary: 216 mg/dL — ABNORMAL HIGH (ref 70–99)
Glucose-Capillary: 142 mg/dL — ABNORMAL HIGH (ref 70–99)

## 2024-07-30 ENCOUNTER — Encounter (HOSPITAL_BASED_OUTPATIENT_CLINIC_OR_DEPARTMENT_OTHER): Admitting: General Surgery

## 2024-07-30 LAB — GLUCOSE, CAPILLARY
Glucose-Capillary: 171 mg/dL — ABNORMAL HIGH (ref 70–99)
Glucose-Capillary: 143 mg/dL — ABNORMAL HIGH (ref 70–99)

## 2024-07-31 ENCOUNTER — Encounter (HOSPITAL_COMMUNITY): Admission: RE | Payer: Self-pay | Source: Home / Self Care

## 2024-07-31 ENCOUNTER — Ambulatory Visit (HOSPITAL_COMMUNITY): Admission: RE | Admit: 2024-07-31 | Source: Home / Self Care | Admitting: Gastroenterology

## 2024-07-31 ENCOUNTER — Encounter (HOSPITAL_BASED_OUTPATIENT_CLINIC_OR_DEPARTMENT_OTHER): Admitting: General Surgery

## 2024-07-31 LAB — GLUCOSE, CAPILLARY
Glucose-Capillary: 240 mg/dL — ABNORMAL HIGH (ref 70–99)
Glucose-Capillary: 178 mg/dL — ABNORMAL HIGH (ref 70–99)

## 2024-08-03 ENCOUNTER — Encounter (HOSPITAL_BASED_OUTPATIENT_CLINIC_OR_DEPARTMENT_OTHER): Admitting: General Surgery

## 2024-08-04 ENCOUNTER — Encounter (HOSPITAL_BASED_OUTPATIENT_CLINIC_OR_DEPARTMENT_OTHER): Admitting: General Surgery

## 2024-08-05 ENCOUNTER — Encounter (HOSPITAL_BASED_OUTPATIENT_CLINIC_OR_DEPARTMENT_OTHER): Admitting: General Surgery

## 2024-08-06 ENCOUNTER — Encounter (HOSPITAL_BASED_OUTPATIENT_CLINIC_OR_DEPARTMENT_OTHER): Admitting: General Surgery

## 2024-08-07 ENCOUNTER — Encounter (HOSPITAL_BASED_OUTPATIENT_CLINIC_OR_DEPARTMENT_OTHER): Admitting: General Surgery

## 2024-08-10 ENCOUNTER — Encounter (HOSPITAL_BASED_OUTPATIENT_CLINIC_OR_DEPARTMENT_OTHER): Admitting: General Surgery

## 2024-08-11 ENCOUNTER — Encounter (HOSPITAL_BASED_OUTPATIENT_CLINIC_OR_DEPARTMENT_OTHER): Admitting: General Surgery

## 2024-08-12 ENCOUNTER — Encounter (HOSPITAL_BASED_OUTPATIENT_CLINIC_OR_DEPARTMENT_OTHER): Admitting: General Surgery

## 2024-08-13 ENCOUNTER — Encounter (HOSPITAL_BASED_OUTPATIENT_CLINIC_OR_DEPARTMENT_OTHER): Admitting: General Surgery

## 2024-08-14 ENCOUNTER — Encounter (HOSPITAL_BASED_OUTPATIENT_CLINIC_OR_DEPARTMENT_OTHER): Admitting: General Surgery

## 2024-08-17 ENCOUNTER — Encounter (HOSPITAL_BASED_OUTPATIENT_CLINIC_OR_DEPARTMENT_OTHER): Admitting: General Surgery

## 2024-08-18 ENCOUNTER — Encounter (HOSPITAL_BASED_OUTPATIENT_CLINIC_OR_DEPARTMENT_OTHER): Admitting: General Surgery

## 2024-08-19 ENCOUNTER — Encounter (HOSPITAL_BASED_OUTPATIENT_CLINIC_OR_DEPARTMENT_OTHER): Admitting: General Surgery

## 2024-08-20 ENCOUNTER — Encounter (HOSPITAL_BASED_OUTPATIENT_CLINIC_OR_DEPARTMENT_OTHER): Admitting: General Surgery

## 2024-08-21 ENCOUNTER — Encounter (HOSPITAL_BASED_OUTPATIENT_CLINIC_OR_DEPARTMENT_OTHER): Admitting: General Surgery

## 2024-10-21 ENCOUNTER — Ambulatory Visit (HOSPITAL_COMMUNITY)

## 2024-10-21 ENCOUNTER — Encounter

## 2024-10-29 ENCOUNTER — Ambulatory Visit: Admitting: Nurse Practitioner
